# Patient Record
Sex: Female | Born: 1950
Health system: Southern US, Community
[De-identification: ages and names within clinical notes are randomized; demographics above are authoritative.]

## PROBLEM LIST (undated history)

## (undated) DIAGNOSIS — M797 Fibromyalgia: Secondary | ICD-10-CM

## (undated) DIAGNOSIS — D649 Anemia, unspecified: Secondary | ICD-10-CM

## (undated) DIAGNOSIS — T883XXA Malignant hyperthermia due to anesthesia, initial encounter: Secondary | ICD-10-CM

## (undated) DIAGNOSIS — C801 Malignant (primary) neoplasm, unspecified: Secondary | ICD-10-CM

## (undated) DIAGNOSIS — K589 Irritable bowel syndrome without diarrhea: Secondary | ICD-10-CM

## (undated) DIAGNOSIS — Z741 Need for assistance with personal care: Secondary | ICD-10-CM

## (undated) DIAGNOSIS — F419 Anxiety disorder, unspecified: Secondary | ICD-10-CM

## (undated) DIAGNOSIS — R569 Unspecified convulsions: Secondary | ICD-10-CM

## (undated) DIAGNOSIS — M5010 Cervical disc disorder with radiculopathy, unspecified cervical region: Secondary | ICD-10-CM

## (undated) DIAGNOSIS — K449 Diaphragmatic hernia without obstruction or gangrene: Secondary | ICD-10-CM

## (undated) DIAGNOSIS — K219 Gastro-esophageal reflux disease without esophagitis: Secondary | ICD-10-CM

## (undated) DIAGNOSIS — G8929 Other chronic pain: Secondary | ICD-10-CM

## (undated) DIAGNOSIS — K222 Esophageal obstruction: Secondary | ICD-10-CM

## (undated) DIAGNOSIS — G47419 Narcolepsy without cataplexy: Secondary | ICD-10-CM

## (undated) DIAGNOSIS — I639 Cerebral infarction, unspecified: Secondary | ICD-10-CM

## (undated) DIAGNOSIS — G43909 Migraine, unspecified, not intractable, without status migrainosus: Secondary | ICD-10-CM

## (undated) DIAGNOSIS — G9341 Metabolic encephalopathy: Secondary | ICD-10-CM

## (undated) DIAGNOSIS — F339 Major depressive disorder, recurrent, unspecified: Secondary | ICD-10-CM

## (undated) DIAGNOSIS — J189 Pneumonia, unspecified organism: Secondary | ICD-10-CM

## (undated) DIAGNOSIS — R198 Other specified symptoms and signs involving the digestive system and abdomen: Secondary | ICD-10-CM

## (undated) DIAGNOSIS — J449 Chronic obstructive pulmonary disease, unspecified: Secondary | ICD-10-CM

## (undated) DIAGNOSIS — IMO0001 Reserved for inherently not codable concepts without codable children: Secondary | ICD-10-CM

## (undated) DIAGNOSIS — G5790 Unspecified mononeuropathy of unspecified lower limb: Secondary | ICD-10-CM

## (undated) DIAGNOSIS — M1990 Unspecified osteoarthritis, unspecified site: Secondary | ICD-10-CM

## (undated) DIAGNOSIS — D496 Neoplasm of unspecified behavior of brain: Secondary | ICD-10-CM

## (undated) DIAGNOSIS — I10 Essential (primary) hypertension: Secondary | ICD-10-CM

## (undated) DIAGNOSIS — T7840XA Allergy, unspecified, initial encounter: Secondary | ICD-10-CM

## (undated) DIAGNOSIS — E039 Hypothyroidism, unspecified: Secondary | ICD-10-CM

## (undated) DIAGNOSIS — G4700 Insomnia, unspecified: Secondary | ICD-10-CM

## (undated) DIAGNOSIS — R011 Cardiac murmur, unspecified: Secondary | ICD-10-CM

## (undated) DIAGNOSIS — K5904 Chronic idiopathic constipation: Secondary | ICD-10-CM

## (undated) DIAGNOSIS — F319 Bipolar disorder, unspecified: Secondary | ICD-10-CM

## (undated) DIAGNOSIS — E785 Hyperlipidemia, unspecified: Secondary | ICD-10-CM

## (undated) DIAGNOSIS — IMO0002 Reserved for concepts with insufficient information to code with codable children: Secondary | ICD-10-CM

## (undated) DIAGNOSIS — K635 Polyp of colon: Secondary | ICD-10-CM

## (undated) DIAGNOSIS — G5601 Carpal tunnel syndrome, right upper limb: Secondary | ICD-10-CM

## (undated) DIAGNOSIS — M542 Cervicalgia: Secondary | ICD-10-CM

## (undated) DIAGNOSIS — M549 Dorsalgia, unspecified: Secondary | ICD-10-CM

## (undated) DIAGNOSIS — K5790 Diverticulosis of intestine, part unspecified, without perforation or abscess without bleeding: Secondary | ICD-10-CM

## (undated) DIAGNOSIS — K859 Acute pancreatitis without necrosis or infection, unspecified: Secondary | ICD-10-CM

## (undated) DIAGNOSIS — G4730 Sleep apnea, unspecified: Secondary | ICD-10-CM

## (undated) DIAGNOSIS — Z5189 Encounter for other specified aftercare: Secondary | ICD-10-CM

## (undated) DIAGNOSIS — F329 Major depressive disorder, single episode, unspecified: Secondary | ICD-10-CM

## (undated) DIAGNOSIS — K56609 Unspecified intestinal obstruction, unspecified as to partial versus complete obstruction: Secondary | ICD-10-CM

## (undated) DIAGNOSIS — R296 Repeated falls: Secondary | ICD-10-CM

## (undated) DIAGNOSIS — G839 Paralytic syndrome, unspecified: Secondary | ICD-10-CM

## (undated) DIAGNOSIS — D126 Benign neoplasm of colon, unspecified: Secondary | ICD-10-CM

## (undated) HISTORY — DX: Dorsalgia, unspecified: M54.9

## (undated) HISTORY — DX: Esophageal obstruction: K22.2

## (undated) HISTORY — PX: REDUCTION MAMMAPLASTY: SUR839

## (undated) HISTORY — DX: Malignant hyperthermia due to anesthesia, initial encounter: T88.3XXA

## (undated) HISTORY — DX: Migraine, unspecified, not intractable, without status migrainosus: G43.909

## (undated) HISTORY — DX: Polyp of colon: K63.5

## (undated) HISTORY — DX: Diaphragmatic hernia without obstruction or gangrene: K44.9

## (undated) HISTORY — DX: Diverticulosis of intestine, part unspecified, without perforation or abscess without bleeding: K57.90

## (undated) HISTORY — DX: Unspecified intestinal obstruction, unspecified as to partial versus complete obstruction: K56.609

## (undated) HISTORY — DX: Neoplasm of unspecified behavior of brain: D49.6

## (undated) HISTORY — DX: Other chronic pain: G89.29

## (undated) HISTORY — DX: Essential (primary) hypertension: I10

## (undated) HISTORY — DX: Chronic idiopathic constipation: K59.04

## (undated) HISTORY — DX: Sleep apnea, unspecified: G47.30

## (undated) HISTORY — DX: Fibromyalgia: M79.7

## (undated) HISTORY — DX: Acute pancreatitis without necrosis or infection, unspecified: K85.90

## (undated) HISTORY — DX: Major depressive disorder, recurrent, unspecified: F33.9

## (undated) HISTORY — DX: Irritable bowel syndrome, unspecified: K58.9

## (undated) HISTORY — PX: OVARIAN CYST REMOVAL: SHX89

## (undated) HISTORY — DX: Benign neoplasm of colon, unspecified: D12.6

## (undated) HISTORY — PX: CATARACT EXTRACTION: SUR2

## (undated) HISTORY — DX: Allergy, unspecified, initial encounter: T78.40XA

## (undated) HISTORY — DX: Bipolar disorder, unspecified: F31.9

---

## 1976-03-13 HISTORY — PX: ABDOMINAL HYSTERECTOMY: SHX81

## 1982-03-13 HISTORY — PX: CHOLECYSTECTOMY: SHX55

## 1992-03-13 HISTORY — PX: BREAST REDUCTION SURGERY: SHX8

## 1999-04-28 ENCOUNTER — Other Ambulatory Visit: Admission: RE | Admit: 1999-04-28 | Discharge: 1999-04-28 | Payer: Self-pay | Admitting: Internal Medicine

## 1999-04-28 ENCOUNTER — Encounter (INDEPENDENT_AMBULATORY_CARE_PROVIDER_SITE_OTHER): Payer: Self-pay | Admitting: Specialist

## 1999-04-28 ENCOUNTER — Encounter: Payer: Self-pay | Admitting: Internal Medicine

## 1999-11-29 ENCOUNTER — Encounter: Payer: Self-pay | Admitting: Family Medicine

## 1999-11-29 ENCOUNTER — Encounter: Admission: RE | Admit: 1999-11-29 | Discharge: 1999-11-29 | Payer: Self-pay | Admitting: Family Medicine

## 2000-03-27 ENCOUNTER — Ambulatory Visit (HOSPITAL_COMMUNITY): Admission: RE | Admit: 2000-03-27 | Discharge: 2000-03-27 | Payer: Self-pay | Admitting: Internal Medicine

## 2000-03-27 ENCOUNTER — Encounter: Payer: Self-pay | Admitting: Internal Medicine

## 2000-05-14 ENCOUNTER — Ambulatory Visit (HOSPITAL_COMMUNITY): Admission: RE | Admit: 2000-05-14 | Discharge: 2000-05-14 | Payer: Self-pay | Admitting: Internal Medicine

## 2000-05-14 ENCOUNTER — Encounter: Payer: Self-pay | Admitting: Internal Medicine

## 2000-07-04 ENCOUNTER — Other Ambulatory Visit: Admission: RE | Admit: 2000-07-04 | Discharge: 2000-07-04 | Payer: Self-pay | Admitting: Family Medicine

## 2000-07-05 ENCOUNTER — Ambulatory Visit (HOSPITAL_COMMUNITY): Admission: RE | Admit: 2000-07-05 | Discharge: 2000-07-05 | Payer: Self-pay | Admitting: Family Medicine

## 2000-07-05 ENCOUNTER — Encounter: Payer: Self-pay | Admitting: Family Medicine

## 2000-07-09 ENCOUNTER — Encounter: Payer: Self-pay | Admitting: Internal Medicine

## 2000-07-09 ENCOUNTER — Ambulatory Visit (HOSPITAL_COMMUNITY): Admission: RE | Admit: 2000-07-09 | Discharge: 2000-07-09 | Payer: Self-pay | Admitting: Internal Medicine

## 2000-08-23 ENCOUNTER — Encounter: Payer: Self-pay | Admitting: Emergency Medicine

## 2000-08-23 ENCOUNTER — Emergency Department (HOSPITAL_COMMUNITY): Admission: EM | Admit: 2000-08-23 | Discharge: 2000-08-23 | Payer: Self-pay | Admitting: Emergency Medicine

## 2000-08-25 ENCOUNTER — Emergency Department (HOSPITAL_COMMUNITY): Admission: EM | Admit: 2000-08-25 | Discharge: 2000-08-25 | Payer: Self-pay | Admitting: *Deleted

## 2000-09-04 ENCOUNTER — Emergency Department (HOSPITAL_COMMUNITY): Admission: EM | Admit: 2000-09-04 | Discharge: 2000-09-04 | Payer: Self-pay | Admitting: Emergency Medicine

## 2000-12-05 ENCOUNTER — Encounter: Admission: RE | Admit: 2000-12-05 | Discharge: 2000-12-05 | Payer: Self-pay | Admitting: Family Medicine

## 2000-12-05 ENCOUNTER — Encounter: Payer: Self-pay | Admitting: Family Medicine

## 2001-03-07 ENCOUNTER — Ambulatory Visit (HOSPITAL_COMMUNITY): Admission: RE | Admit: 2001-03-07 | Discharge: 2001-03-07 | Payer: Self-pay | Admitting: Family Medicine

## 2001-03-07 ENCOUNTER — Encounter: Payer: Self-pay | Admitting: Family Medicine

## 2001-04-02 ENCOUNTER — Encounter: Payer: Self-pay | Admitting: Urology

## 2001-04-02 ENCOUNTER — Ambulatory Visit (HOSPITAL_COMMUNITY): Admission: RE | Admit: 2001-04-02 | Discharge: 2001-04-02 | Payer: Self-pay | Admitting: Urology

## 2001-08-04 ENCOUNTER — Emergency Department (HOSPITAL_COMMUNITY): Admission: EM | Admit: 2001-08-04 | Discharge: 2001-08-04 | Payer: Self-pay | Admitting: Emergency Medicine

## 2001-08-16 ENCOUNTER — Encounter: Payer: Self-pay | Admitting: Family Medicine

## 2001-08-16 ENCOUNTER — Ambulatory Visit (HOSPITAL_COMMUNITY): Admission: RE | Admit: 2001-08-16 | Discharge: 2001-08-16 | Payer: Self-pay | Admitting: Family Medicine

## 2001-08-20 ENCOUNTER — Emergency Department (HOSPITAL_COMMUNITY): Admission: EM | Admit: 2001-08-20 | Discharge: 2001-08-20 | Payer: Self-pay | Admitting: Emergency Medicine

## 2001-09-11 ENCOUNTER — Encounter: Payer: Self-pay | Admitting: Family Medicine

## 2001-09-11 ENCOUNTER — Ambulatory Visit (HOSPITAL_COMMUNITY): Admission: RE | Admit: 2001-09-11 | Discharge: 2001-09-11 | Payer: Self-pay | Admitting: Family Medicine

## 2001-09-19 ENCOUNTER — Ambulatory Visit (HOSPITAL_COMMUNITY): Admission: RE | Admit: 2001-09-19 | Discharge: 2001-09-19 | Payer: Self-pay | Admitting: Internal Medicine

## 2001-09-19 ENCOUNTER — Encounter: Payer: Self-pay | Admitting: Internal Medicine

## 2001-10-01 ENCOUNTER — Encounter (INDEPENDENT_AMBULATORY_CARE_PROVIDER_SITE_OTHER): Payer: Self-pay | Admitting: Specialist

## 2001-10-01 ENCOUNTER — Ambulatory Visit (HOSPITAL_COMMUNITY): Admission: RE | Admit: 2001-10-01 | Discharge: 2001-10-01 | Payer: Self-pay | Admitting: Internal Medicine

## 2001-10-31 ENCOUNTER — Encounter: Payer: Self-pay | Admitting: Internal Medicine

## 2001-11-12 ENCOUNTER — Encounter: Payer: Self-pay | Admitting: Internal Medicine

## 2001-11-12 ENCOUNTER — Ambulatory Visit (HOSPITAL_COMMUNITY): Admission: RE | Admit: 2001-11-12 | Discharge: 2001-11-12 | Payer: Self-pay | Admitting: Internal Medicine

## 2001-12-02 ENCOUNTER — Encounter: Payer: Self-pay | Admitting: Family Medicine

## 2001-12-02 ENCOUNTER — Encounter: Admission: RE | Admit: 2001-12-02 | Discharge: 2001-12-02 | Payer: Self-pay | Admitting: Family Medicine

## 2002-01-01 ENCOUNTER — Ambulatory Visit (HOSPITAL_COMMUNITY): Admission: RE | Admit: 2002-01-01 | Discharge: 2002-01-01 | Payer: Self-pay | Admitting: Internal Medicine

## 2002-01-01 ENCOUNTER — Encounter: Payer: Self-pay | Admitting: Internal Medicine

## 2002-03-28 ENCOUNTER — Encounter: Payer: Self-pay | Admitting: Family Medicine

## 2002-03-28 ENCOUNTER — Ambulatory Visit (HOSPITAL_COMMUNITY): Admission: RE | Admit: 2002-03-28 | Discharge: 2002-03-28 | Payer: Self-pay | Admitting: Family Medicine

## 2002-05-27 ENCOUNTER — Ambulatory Visit (HOSPITAL_COMMUNITY): Admission: RE | Admit: 2002-05-27 | Discharge: 2002-05-27 | Payer: Self-pay | Admitting: Family Medicine

## 2002-05-27 ENCOUNTER — Encounter: Payer: Self-pay | Admitting: Family Medicine

## 2002-07-15 ENCOUNTER — Ambulatory Visit (HOSPITAL_COMMUNITY): Admission: RE | Admit: 2002-07-15 | Discharge: 2002-07-15 | Payer: Self-pay | Admitting: Family Medicine

## 2002-07-15 ENCOUNTER — Encounter: Payer: Self-pay | Admitting: Family Medicine

## 2002-09-01 ENCOUNTER — Ambulatory Visit (HOSPITAL_COMMUNITY): Admission: RE | Admit: 2002-09-01 | Discharge: 2002-09-01 | Payer: Self-pay | Admitting: Family Medicine

## 2002-09-01 ENCOUNTER — Encounter: Payer: Self-pay | Admitting: Family Medicine

## 2002-10-13 ENCOUNTER — Ambulatory Visit (HOSPITAL_COMMUNITY): Admission: RE | Admit: 2002-10-13 | Discharge: 2002-10-13 | Payer: Self-pay | Admitting: Neurology

## 2002-10-13 ENCOUNTER — Encounter: Payer: Self-pay | Admitting: Neurology

## 2002-11-21 ENCOUNTER — Encounter: Payer: Self-pay | Admitting: Neurology

## 2002-11-21 ENCOUNTER — Ambulatory Visit (HOSPITAL_COMMUNITY): Admission: RE | Admit: 2002-11-21 | Discharge: 2002-11-21 | Payer: Self-pay | Admitting: Neurology

## 2002-11-26 ENCOUNTER — Encounter: Payer: Self-pay | Admitting: Family Medicine

## 2002-11-26 ENCOUNTER — Ambulatory Visit (HOSPITAL_COMMUNITY): Admission: RE | Admit: 2002-11-26 | Discharge: 2002-11-26 | Payer: Self-pay | Admitting: Family Medicine

## 2002-12-09 ENCOUNTER — Encounter: Payer: Self-pay | Admitting: Family Medicine

## 2002-12-09 ENCOUNTER — Ambulatory Visit (HOSPITAL_COMMUNITY): Admission: RE | Admit: 2002-12-09 | Discharge: 2002-12-09 | Payer: Self-pay | Admitting: Family Medicine

## 2003-01-01 ENCOUNTER — Encounter: Payer: Self-pay | Admitting: Family Medicine

## 2003-01-01 ENCOUNTER — Encounter: Admission: RE | Admit: 2003-01-01 | Discharge: 2003-01-01 | Payer: Self-pay | Admitting: Family Medicine

## 2003-04-02 ENCOUNTER — Ambulatory Visit (HOSPITAL_COMMUNITY): Admission: RE | Admit: 2003-04-02 | Discharge: 2003-04-02 | Payer: Self-pay | Admitting: Family Medicine

## 2003-04-10 ENCOUNTER — Ambulatory Visit (HOSPITAL_COMMUNITY): Admission: RE | Admit: 2003-04-10 | Discharge: 2003-04-10 | Payer: Self-pay | Admitting: Family Medicine

## 2003-08-09 ENCOUNTER — Inpatient Hospital Stay (HOSPITAL_COMMUNITY): Admission: EM | Admit: 2003-08-09 | Discharge: 2003-08-13 | Payer: Self-pay | Admitting: Emergency Medicine

## 2003-12-19 ENCOUNTER — Inpatient Hospital Stay (HOSPITAL_COMMUNITY): Admission: EM | Admit: 2003-12-19 | Discharge: 2003-12-21 | Payer: Self-pay | Admitting: Emergency Medicine

## 2003-12-20 IMAGING — CT CT HEAD W/O CM
1 series · 16 of 26 positions shown, 20 images · non-contrast
Comparison: none

FINDINGS
CLINICAL DATA: DIZZINESS; FALLS; DIABETES
CT OF THE HEAD WITHOUT CONTRAST
ROUTINE NON-CONTRAST HEAD CT WAS PERFORMED.
THERE IS NO EVIDENCE OF INTRACRANIAL HEMORRHAGE, BRAIN EDEMA, OR MASS EFFECT.  THE VENTRICLES ARE
NORMAL.  NO EXTRA-AXIAL ABNORMALITIES ARE IDENTIFIED.  BONE WINDOWS SHOW NO SIGNIFICANT
ABNORMALITIES.
IMPRESSION
NEGATIVE NON-CONTRAST HEAD CT.

[Series 7603: — · axial · 0.44mm/px · z∈[-659,-544]mm · 16 of 26 slices shown, 20 images]
[im 2/26  brain]
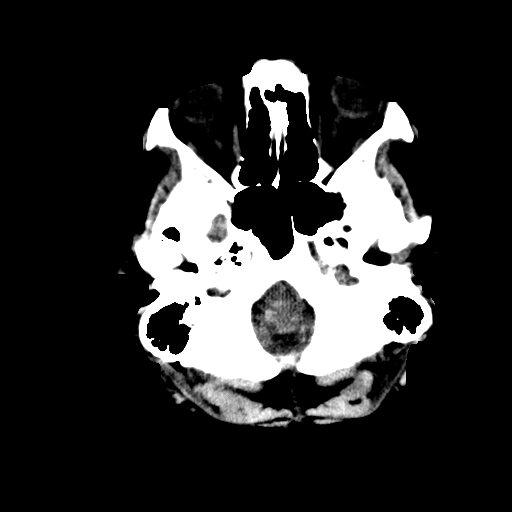
[im 2/26  bone]
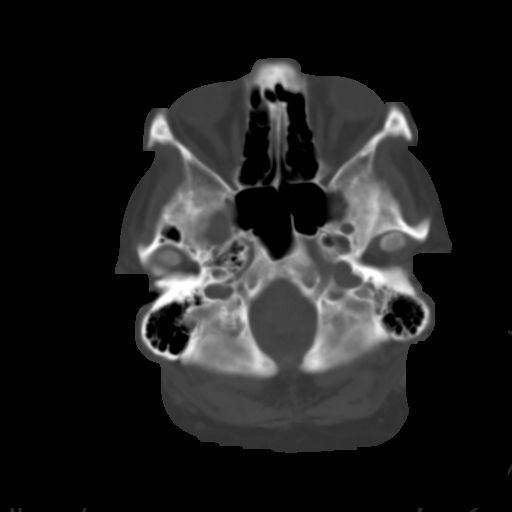
[im 4/26  brain]
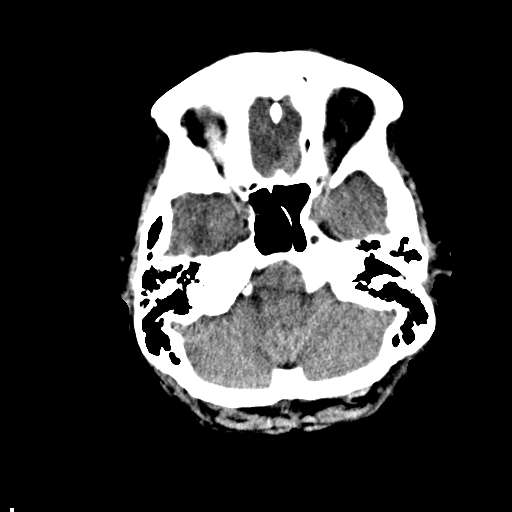
[im 5/26  brain]
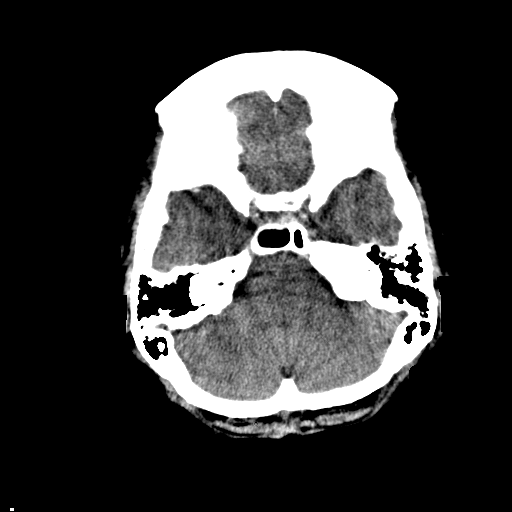
[im 7/26  brain]
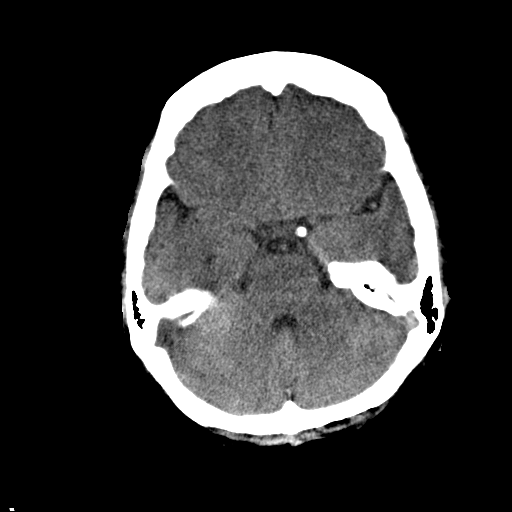
[im 8/26  brain]
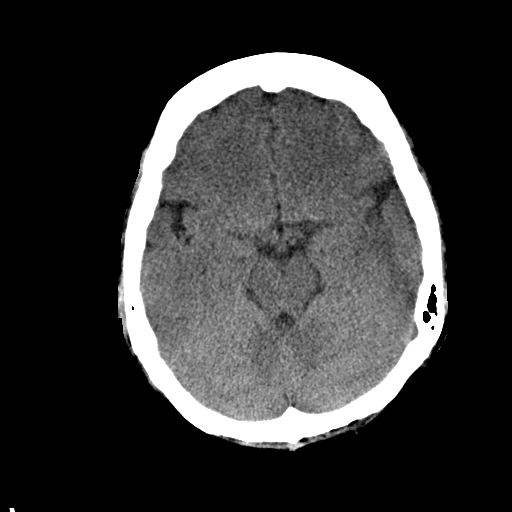
[im 8/26  bone]
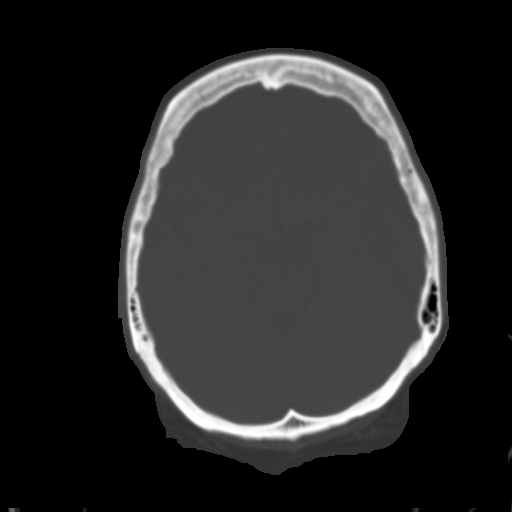
[im 10/26  brain]
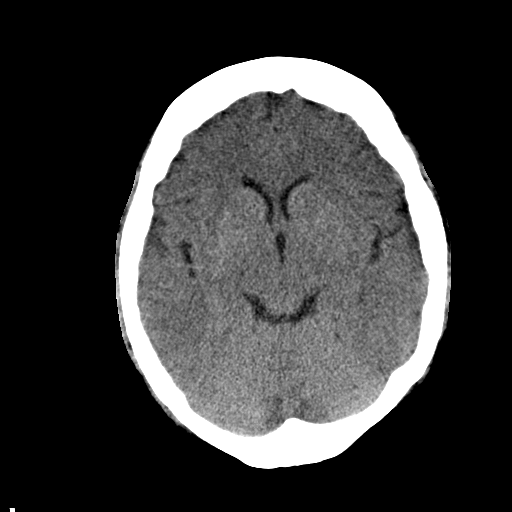
[im 11/26  brain]
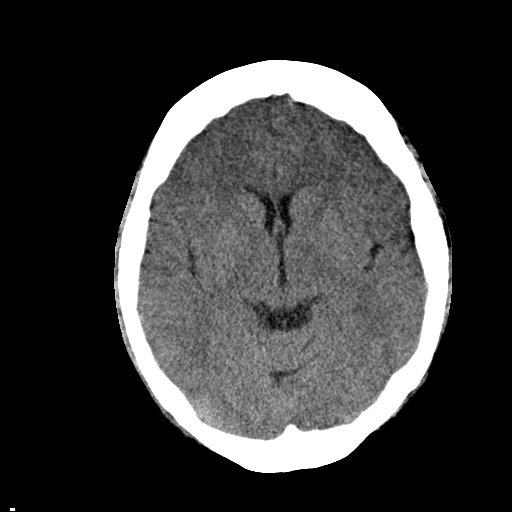
[im 13/26  brain]
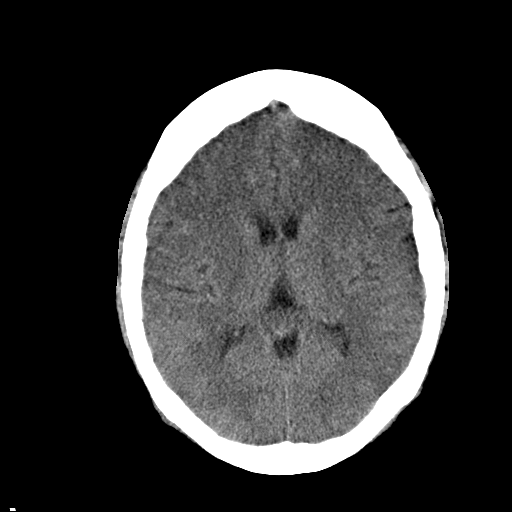
[im 14/26  brain]
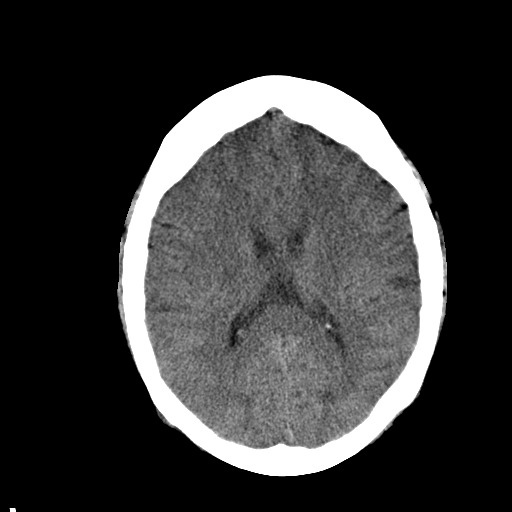
[im 14/26  bone]
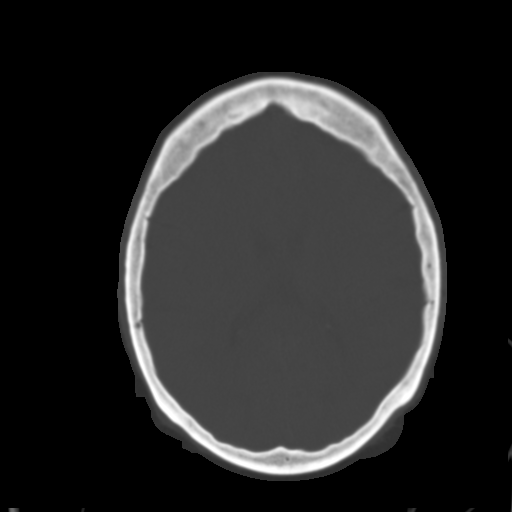
[im 16/26  brain]
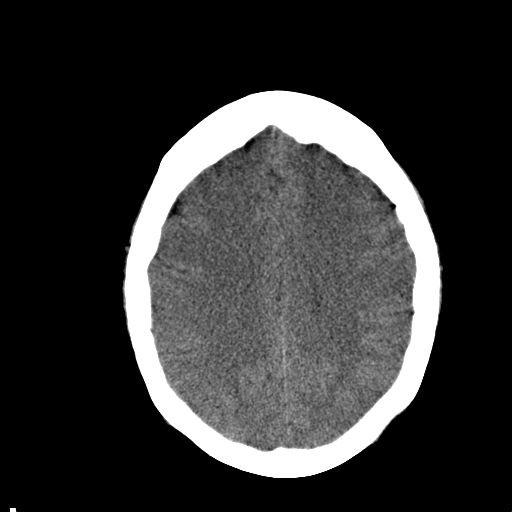
[im 17/26  brain]
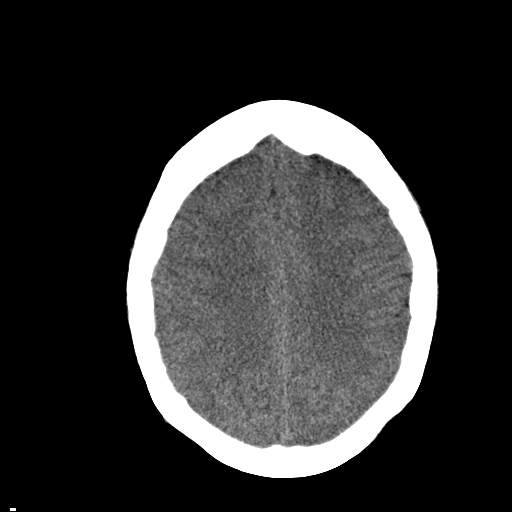
[im 19/26  brain]
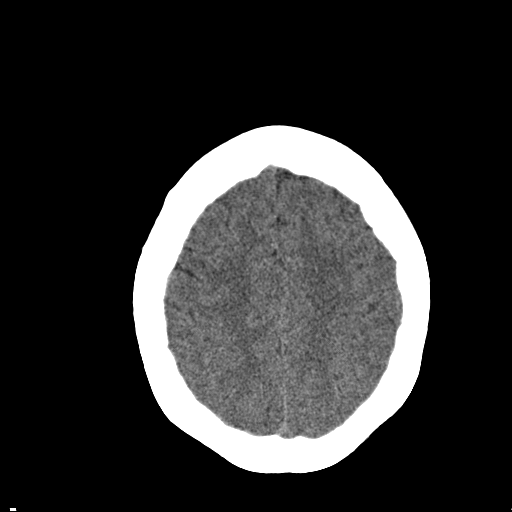
[im 20/26  brain]
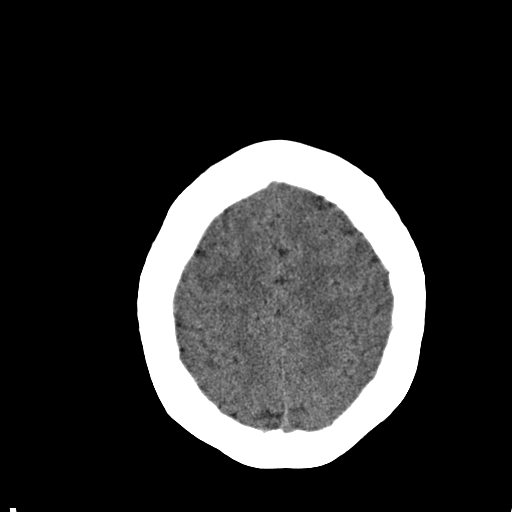
[im 20/26  bone]
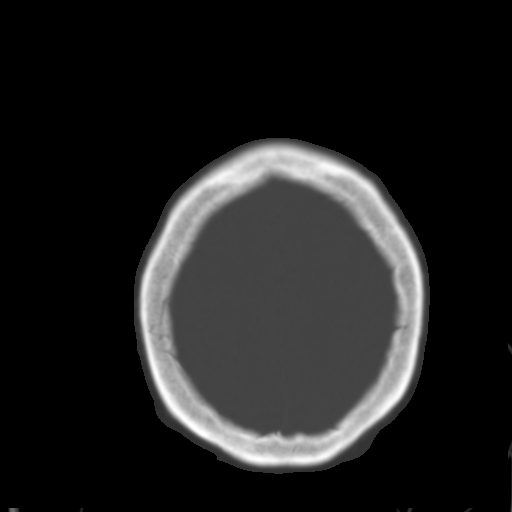
[im 22/26  brain]
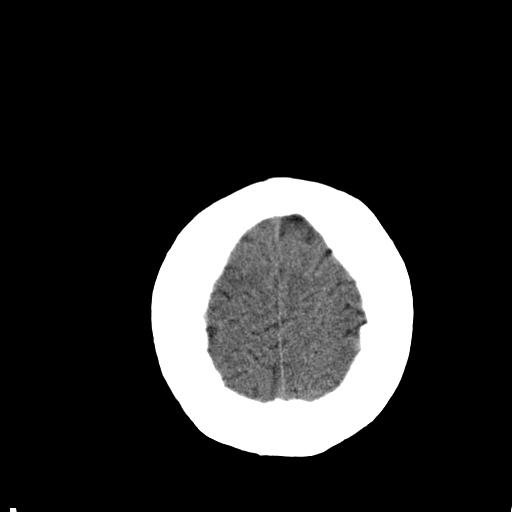
[im 23/26  brain]
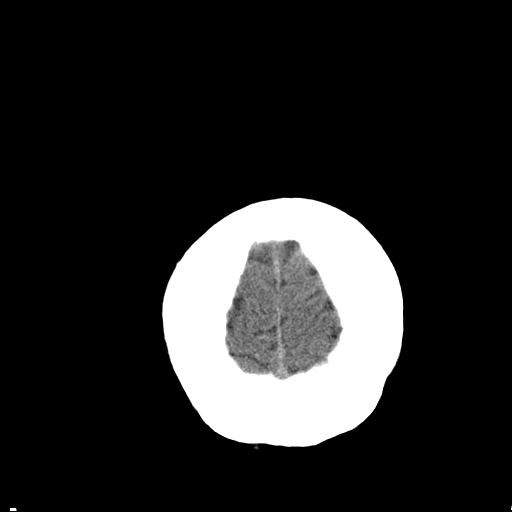
[im 25/26  brain]
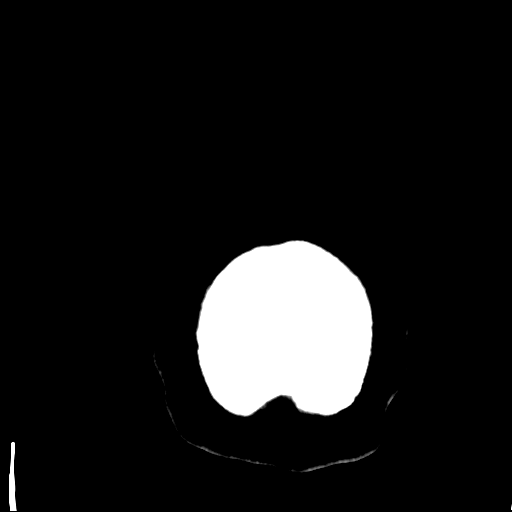

[16 of 26 positions shown; findings below may reference images not displayed]

## 2003-12-21 ENCOUNTER — Encounter: Payer: Self-pay | Admitting: Neurology

## 2004-01-04 ENCOUNTER — Encounter: Admission: RE | Admit: 2004-01-04 | Discharge: 2004-01-04 | Payer: Self-pay | Admitting: Family Medicine

## 2004-02-08 ENCOUNTER — Ambulatory Visit: Payer: Self-pay | Admitting: Family Medicine

## 2004-03-02 ENCOUNTER — Ambulatory Visit (HOSPITAL_COMMUNITY): Admission: RE | Admit: 2004-03-02 | Discharge: 2004-03-02 | Payer: Self-pay | Admitting: Family Medicine

## 2004-03-13 DIAGNOSIS — K859 Acute pancreatitis without necrosis or infection, unspecified: Secondary | ICD-10-CM

## 2004-03-13 HISTORY — PX: NM MYOCAR PERF WALL MOTION: HXRAD629

## 2004-03-13 HISTORY — DX: Acute pancreatitis without necrosis or infection, unspecified: K85.90

## 2004-03-26 IMAGING — CT CT PELVIS W/ CM
1 of 2 series · 14 of 32 positions shown, 19 images · IV contrast (omnipaque)
Comparison: none

FINDINGS
CLINICAL DATA: RIGHT HEPATIC LOBE LESION.
CT ABDOMEN WITH CONTRAST
COMPARING TO 01/01/02.
CONTIGUOUS AXIAL CT IMAGES WERE OBTAINED FROM THE LUNG BASES THROUGH THE ILIAC CRESTS FOLLOWING
ORAL CONTRAST AND INTRAVENOUS ADMINISTRATION OF 150 CC OMNIPAQUE 300 IV CONTRAST.
THE LUNG BASES APPEAR CLEAR.  THE GALLBLADDER HAS BEEN REMOVED.
ALONG THE ANTERIOR PORTION OF THE POSTERIOR SEGMENT OF THE RIGHT HEPATIC LOBE, WE ONCE AGAIN
DEMONSTRATE ILL DEFINED HYPODENSITY MEASURING APPROXIMATELY 1.5 CM.  THE SMALL REGION OF
HYPODENSITY IS OVERALL UNCHANGED AND AGAIN IS FELT TO LIKELY REPRESENT FOCAL FATTY INFILTRATION.
THE LESION IS OF LESS THAN LIVER PARENCHYMAL DENSITY ON THE EARLY PORTAL VENOUS PHASE IMAGES BUT IS
ISODENSE TO THE LIVER ON DELAYED PHASE IMAGES.  THE SPLEEN AND PANCREAS ARE WITHIN NORMAL LIMITS.
NO RETROPERITONEAL ADENOPATHY.  THERE ARE FILLING DEFECTS IN THE DUODENUM AND SMALL BOWEL
CONSISTENT WITH A RECENT MEAL.  THESE FILLING DEFECTS ARE NOT PRESENT ON THE PRIOR STUDY AND THUS,
I DON'T THINK THAT THEY REPRESENT POLYPS.
THE DUODENUM DOES EXTEND INFERIORLY ALMOST TO THE LEVEL OF THE ILIAC CRESTS, AND ONLY EXTENDS BACK
UP TO THE LEVEL OF THE LOWER POLES OF THE KIDNEYS.  THIS IS SUGGESTIVE OF A LAX LIGAMENT OF TREITZ.
 I DO NOT DEMONSTRATE REVERSAL OF LOCATION OF THE SUPERIOR MESENTERIC ARTERY AND VEIN TO SUGGEST
ACTUAL MALROTATION.
IMPRESSION
1.  STABLE APPEARANCE OF INDISTINCT LOW DENSITY LESION ALONG THE POSTERIOR SEGMENT OF THE RIGHT
HEPATIC LOBE, STATISTICALLY LIKELY TO REPRESENT FOCAL FATTY INFILTRATION.  IF CLINICALLY WARRANTED,
A NON CONTRAST MR WITHIN IN AND OUT OF PHASE IMAGING COULD BE USED TO FURTHER ASSESS THIS LESION.
2.  PROBABLE LAX LIGAMENT OF TREITZ ALTHOUGH NO OVERT MALROTATION.
CT PELVIS W/CONTRAST
CONTIGUOUS AXIAL CT IMAGES WERE OBTAINED FROM THE ILIAC CRESTS TO THE PROXIMAL FEMURS FOLLOWING
ORAL AND IV CONTRAST.
NO EVIDENCE OF APPENDICEAL INFLAMMATION.  VISUALIZED BOWEL APPEARS UNREMARKABLE.  THE PATIENT HAS
HAD PRIOR HYSTERECTOMY.  BLADDER APPEARS NORMAL.
UNREMARKABLE CT APPEARANCE OF THE PELVIS.

[Series 5228: — · axial · 0.72mm/px · z∈[+1322,+1732]mm · 14 of 92 slices shown, 19 images]
[im 5/92  soft-tissue]
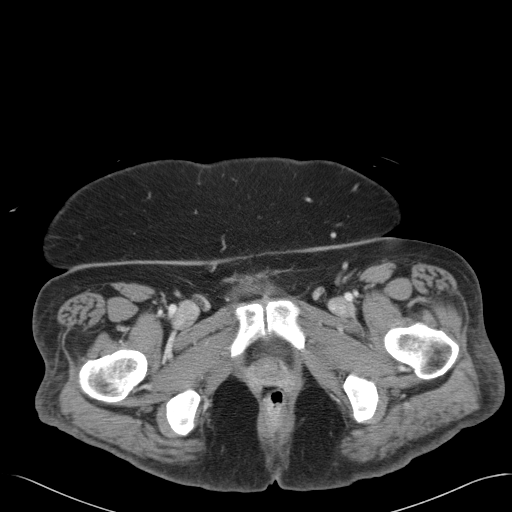
[im 5/92  bone]
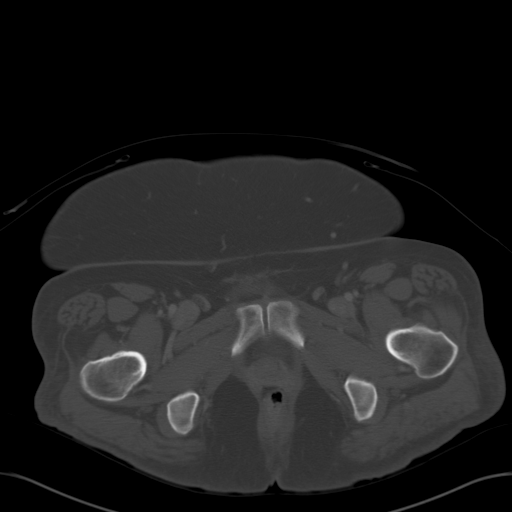
[im 14/92  soft-tissue]
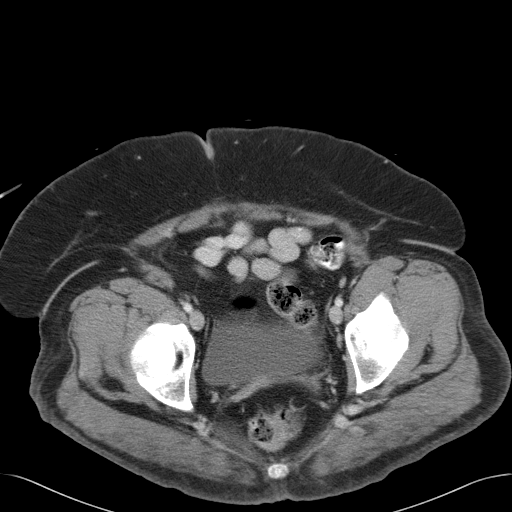
[im 19/92  soft-tissue]
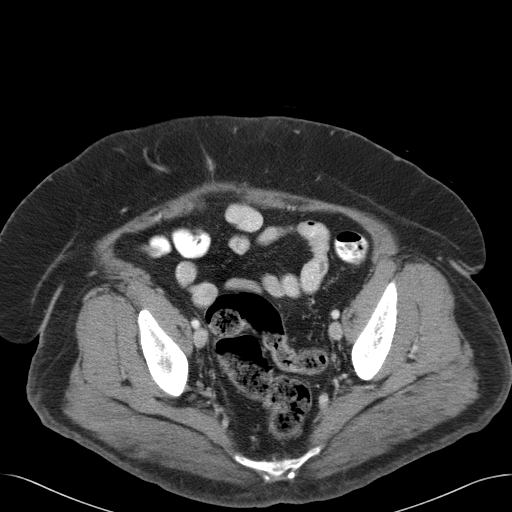
[im 28/92  soft-tissue]
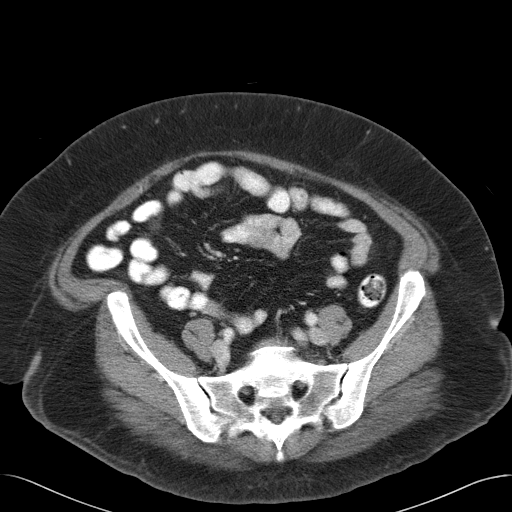
[im 32/92  soft-tissue]
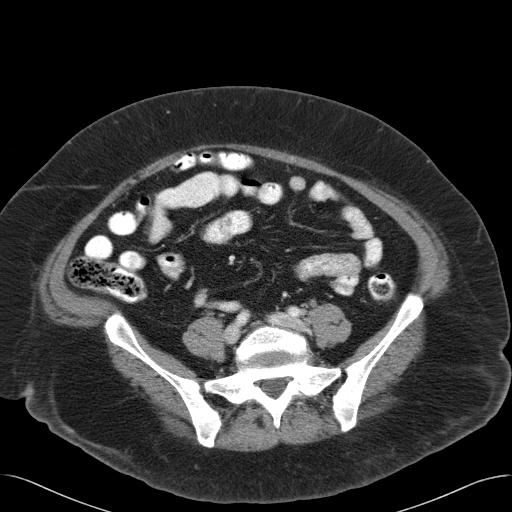
[im 41/92  soft-tissue]
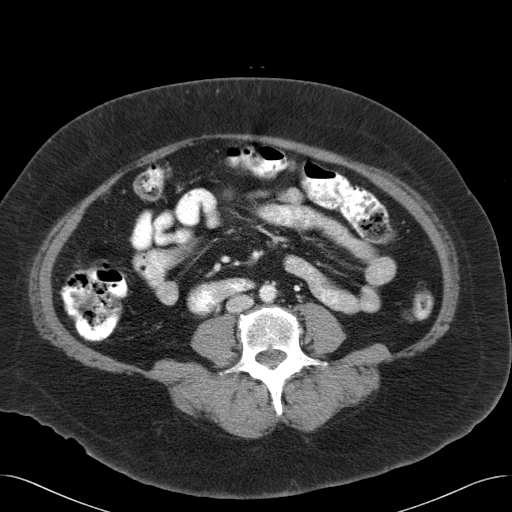
[im 46/92  soft-tissue]
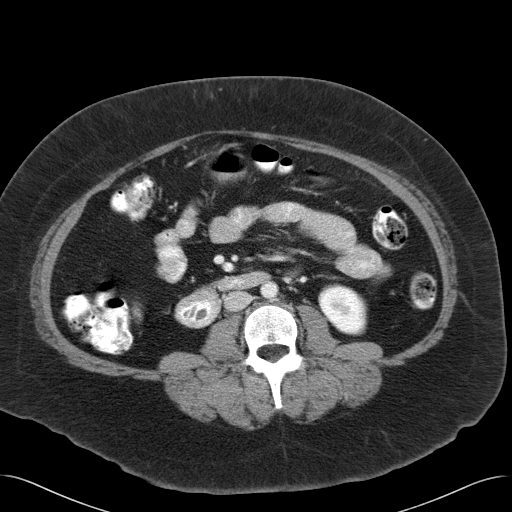
[im 51/92  soft-tissue]
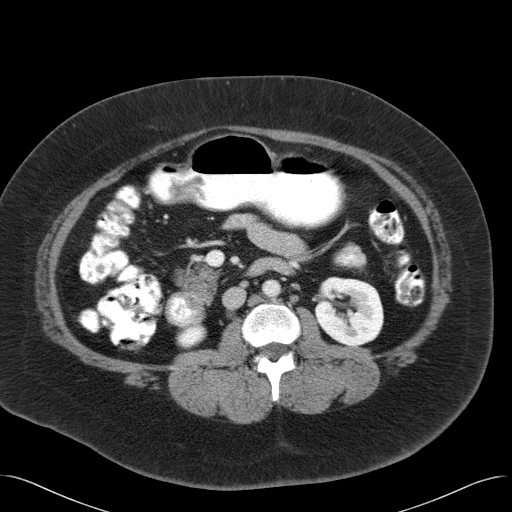
[im 60/92  soft-tissue]
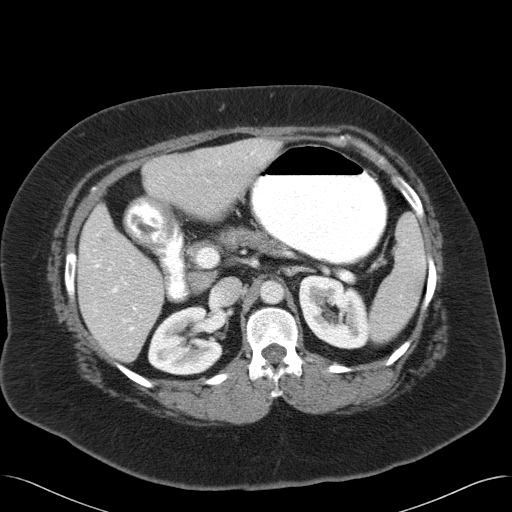
[im 60/92  bone]
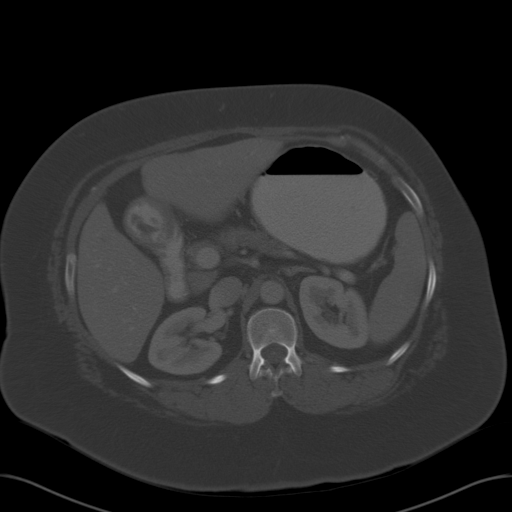
[im 64/92  soft-tissue]
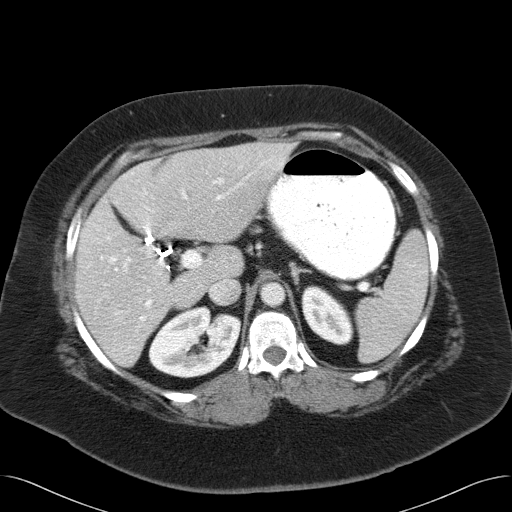
[im 73/92  soft-tissue]
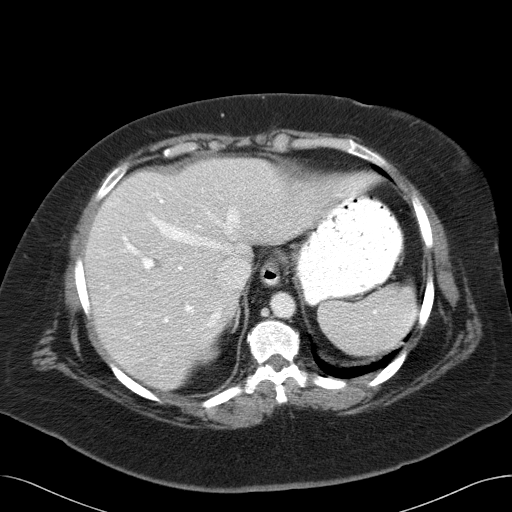
[im 73/92  lung]
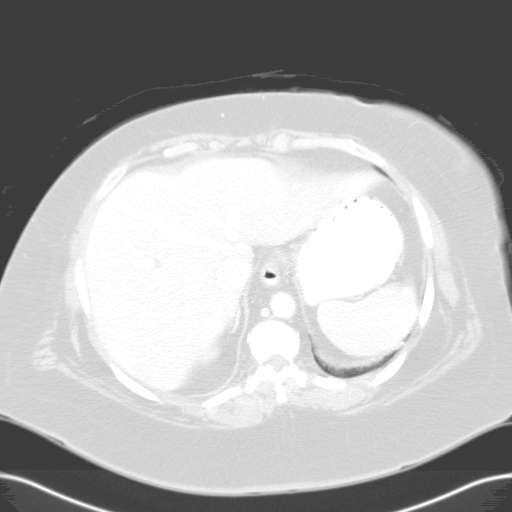
[im 78/92  soft-tissue]
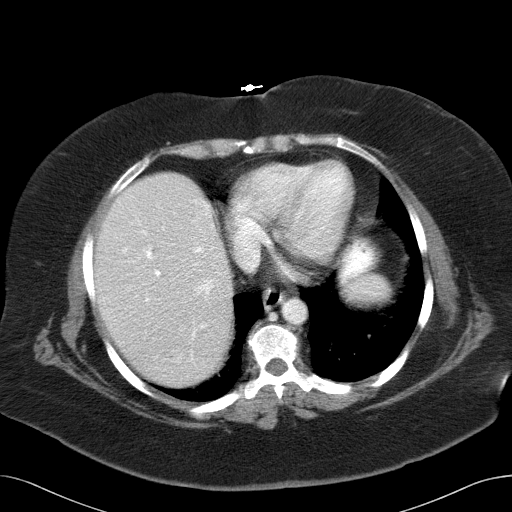
[im 78/92  lung]
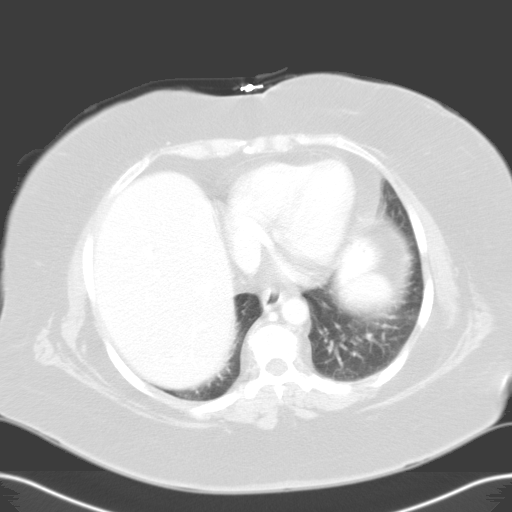
[im 82/92  lung]
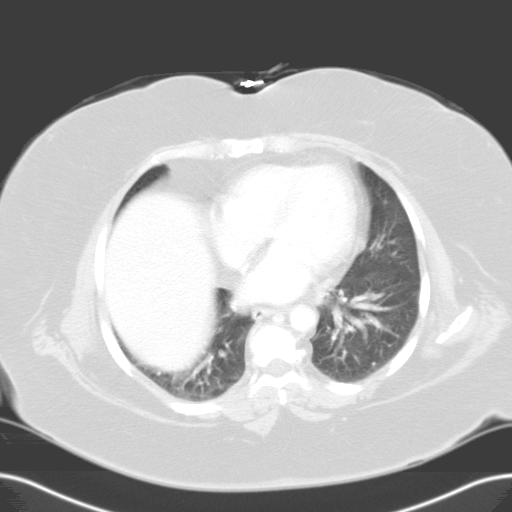
[im 87/92  soft-tissue]
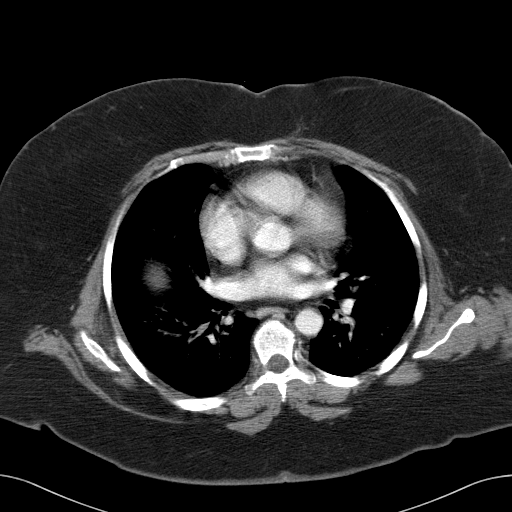
[im 87/92  lung]
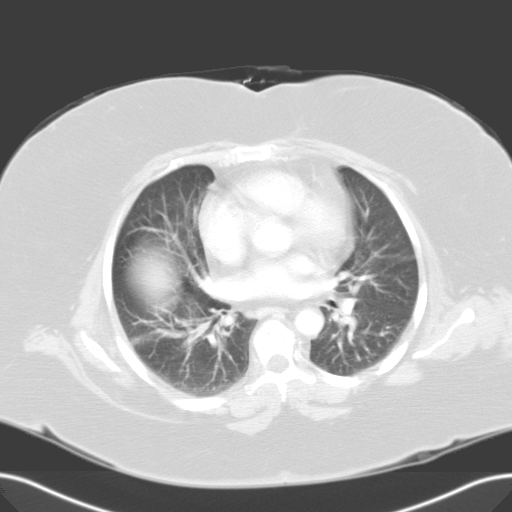

[14 of 32 positions shown; findings below may reference images not displayed]

## 2004-04-18 ENCOUNTER — Ambulatory Visit: Payer: Self-pay | Admitting: Family Medicine

## 2004-05-10 ENCOUNTER — Ambulatory Visit: Payer: Self-pay | Admitting: Family Medicine

## 2004-05-11 ENCOUNTER — Ambulatory Visit: Payer: Self-pay | Admitting: Family Medicine

## 2004-05-13 ENCOUNTER — Encounter (INDEPENDENT_AMBULATORY_CARE_PROVIDER_SITE_OTHER): Payer: Self-pay | Admitting: *Deleted

## 2004-05-17 ENCOUNTER — Ambulatory Visit: Payer: Self-pay | Admitting: Internal Medicine

## 2004-05-22 ENCOUNTER — Ambulatory Visit: Admission: RE | Admit: 2004-05-22 | Discharge: 2004-05-22 | Payer: Self-pay | Admitting: Family Medicine

## 2004-05-31 ENCOUNTER — Ambulatory Visit: Payer: Self-pay | Admitting: Internal Medicine

## 2004-06-07 ENCOUNTER — Ambulatory Visit: Payer: Self-pay | Admitting: Family Medicine

## 2004-06-08 ENCOUNTER — Ambulatory Visit: Payer: Self-pay | Admitting: Pulmonary Disease

## 2004-06-16 ENCOUNTER — Ambulatory Visit (HOSPITAL_COMMUNITY): Admission: RE | Admit: 2004-06-16 | Discharge: 2004-06-16 | Payer: Self-pay | Admitting: Internal Medicine

## 2004-07-05 ENCOUNTER — Ambulatory Visit: Payer: Self-pay | Admitting: Family Medicine

## 2004-07-18 ENCOUNTER — Ambulatory Visit: Payer: Self-pay | Admitting: Orthopedic Surgery

## 2004-07-19 ENCOUNTER — Encounter: Payer: Self-pay | Admitting: Orthopedic Surgery

## 2004-07-20 ENCOUNTER — Ambulatory Visit: Payer: Self-pay | Admitting: Family Medicine

## 2004-07-22 ENCOUNTER — Ambulatory Visit (HOSPITAL_COMMUNITY): Admission: RE | Admit: 2004-07-22 | Discharge: 2004-07-22 | Payer: Self-pay | Admitting: Orthopedic Surgery

## 2004-07-22 ENCOUNTER — Ambulatory Visit: Payer: Self-pay | Admitting: Orthopedic Surgery

## 2004-07-22 HISTORY — PX: CARPAL TUNNEL RELEASE: SHX101

## 2004-07-27 ENCOUNTER — Ambulatory Visit: Payer: Self-pay | Admitting: Orthopedic Surgery

## 2004-08-03 ENCOUNTER — Ambulatory Visit: Payer: Self-pay | Admitting: Family Medicine

## 2004-08-10 ENCOUNTER — Ambulatory Visit: Payer: Self-pay | Admitting: Orthopedic Surgery

## 2004-08-17 ENCOUNTER — Ambulatory Visit: Payer: Self-pay | Admitting: Pulmonary Disease

## 2004-08-23 ENCOUNTER — Ambulatory Visit (HOSPITAL_COMMUNITY): Admission: RE | Admit: 2004-08-23 | Discharge: 2004-08-23 | Payer: Self-pay | Admitting: Family Medicine

## 2004-08-24 ENCOUNTER — Ambulatory Visit: Payer: Self-pay | Admitting: Orthopedic Surgery

## 2004-09-02 ENCOUNTER — Ambulatory Visit: Payer: Self-pay | Admitting: Orthopedic Surgery

## 2004-09-02 ENCOUNTER — Ambulatory Visit (HOSPITAL_COMMUNITY): Admission: RE | Admit: 2004-09-02 | Discharge: 2004-09-02 | Payer: Self-pay | Admitting: Orthopedic Surgery

## 2004-09-07 ENCOUNTER — Ambulatory Visit: Payer: Self-pay | Admitting: Orthopedic Surgery

## 2004-09-14 ENCOUNTER — Ambulatory Visit: Payer: Self-pay | Admitting: Orthopedic Surgery

## 2004-09-14 ENCOUNTER — Ambulatory Visit: Payer: Self-pay | Admitting: Family Medicine

## 2004-10-19 ENCOUNTER — Ambulatory Visit: Payer: Self-pay | Admitting: Orthopedic Surgery

## 2004-10-27 ENCOUNTER — Ambulatory Visit: Payer: Self-pay | Admitting: Family Medicine

## 2004-12-22 ENCOUNTER — Ambulatory Visit: Payer: Self-pay | Admitting: Family Medicine

## 2004-12-27 ENCOUNTER — Emergency Department (HOSPITAL_COMMUNITY): Admission: EM | Admit: 2004-12-27 | Discharge: 2004-12-27 | Payer: Self-pay | Admitting: Emergency Medicine

## 2005-01-11 ENCOUNTER — Encounter: Admission: RE | Admit: 2005-01-11 | Discharge: 2005-01-11 | Payer: Self-pay | Admitting: Family Medicine

## 2005-01-24 ENCOUNTER — Ambulatory Visit: Payer: Self-pay | Admitting: Family Medicine

## 2005-02-15 ENCOUNTER — Ambulatory Visit: Payer: Self-pay | Admitting: Family Medicine

## 2005-03-03 IMAGING — CT CT HEAD W/O CM
1 series · 16 of 30 positions shown, 20 images · non-contrast
Comparison: none

CLINICAL DATA: Slurred speech.  Right arm numbness and weakness.  Altered level of consciousness.  Dizziness.  Evaluate for infarct or hemorrhage.  
 HEAD CT WITHOUT CONTRAST
 Routine noncontrast head CT was performed.  Comparison is made with prior study on 05/27/02.  
 A small ill-defined area of decreased attenuation is seen along the inferior aspect of the right lentiform nucleus which was not seen on previous study and is suspicious for an acute lacunar infarct.  There is no evidence of hemorrhage, abnormal mass effect or midline shift.  There is no evidence of extra-axial abnormalities or hydrocephalus.  
 IMPRESSION
 No evidence of hemorrhage or mass effect.
 Suspect acute lacunar infarct in the inferior right lentiform nucleus.  CT follow-up or brain MRI is recommended for further evaluation.

[Series 4615: — · axial · 0.43mm/px · z∈[-617,-482]mm · 16 of 30 slices shown, 20 images]
[im 2/30  brain]
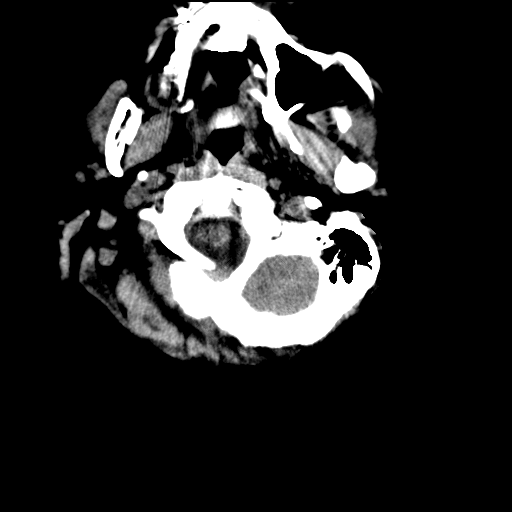
[im 2/30  bone]
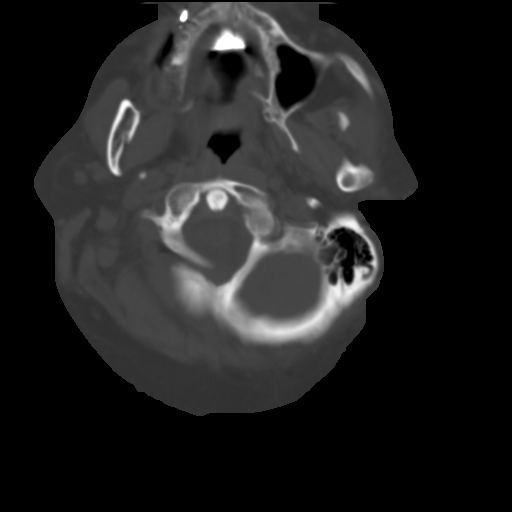
[im 4/30  brain]
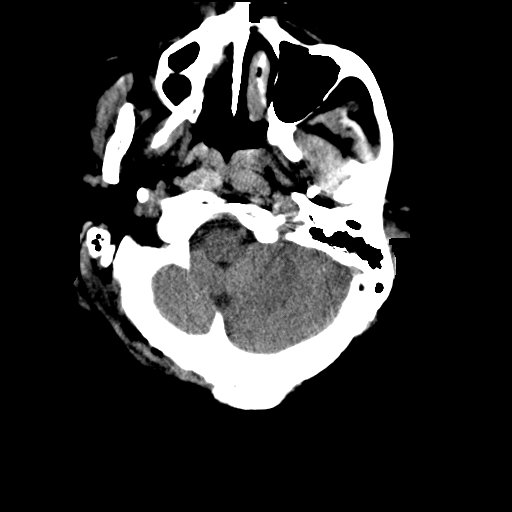
[im 6/30  brain]
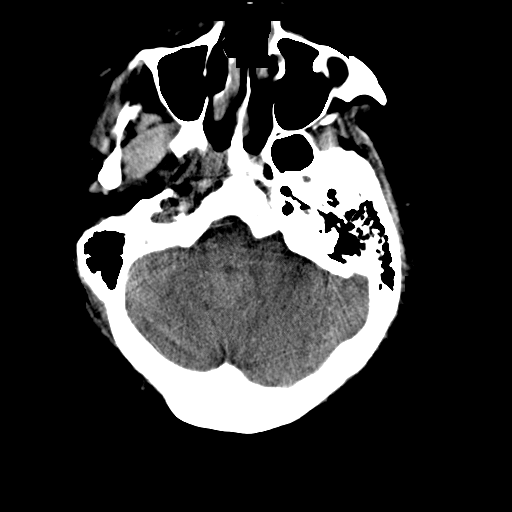
[im 8/30  brain]
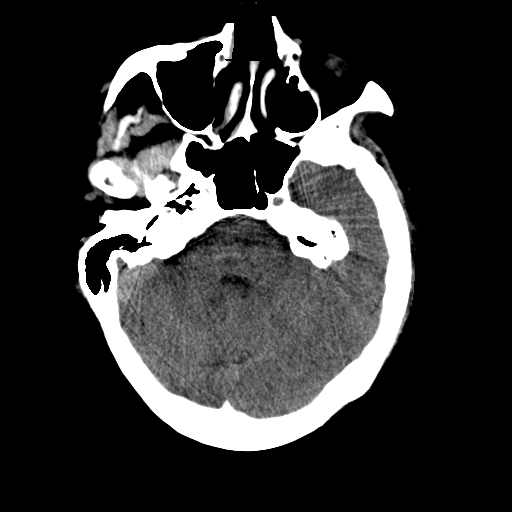
[im 9/30  brain]
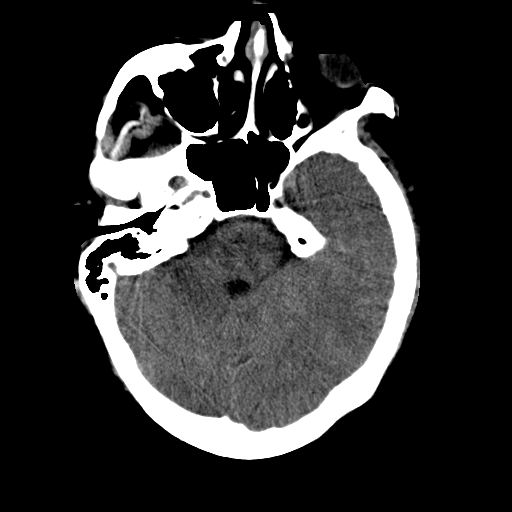
[im 9/30  bone]
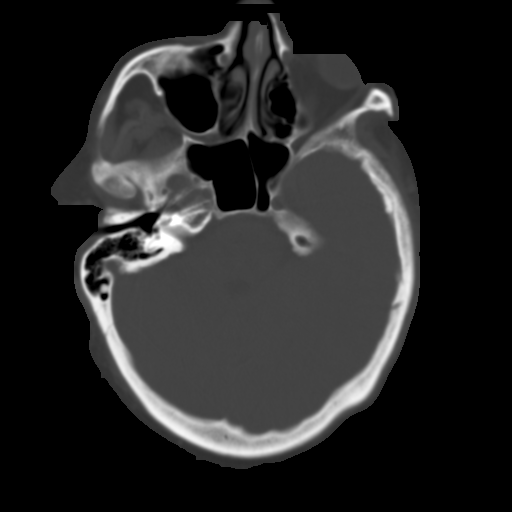
[im 11/30  brain]
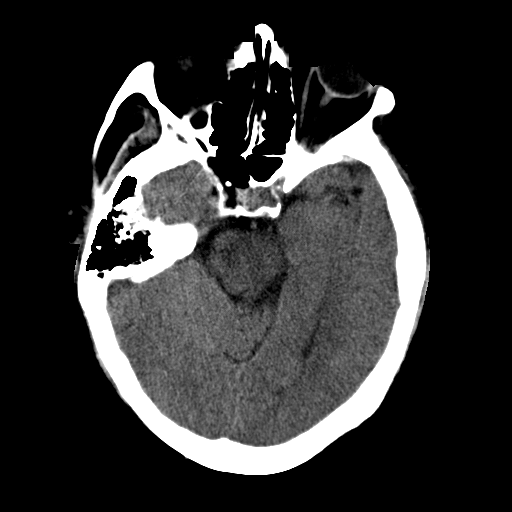
[im 13/30  brain]
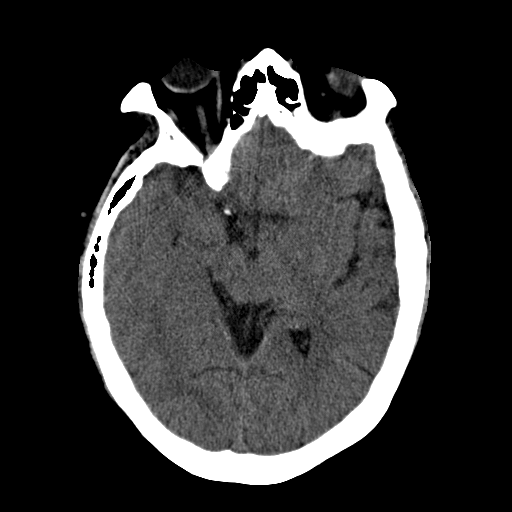
[im 15/30  brain]
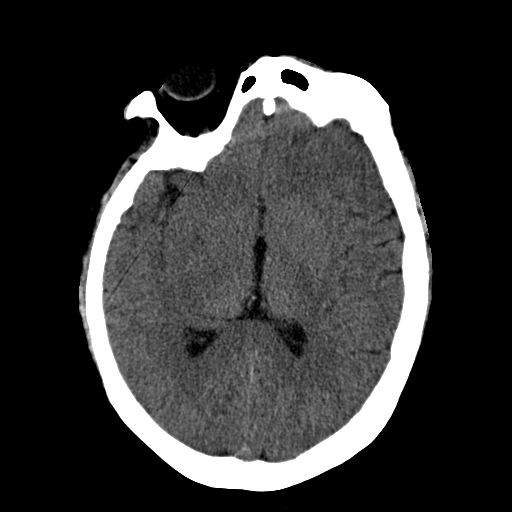
[im 16/30  brain]
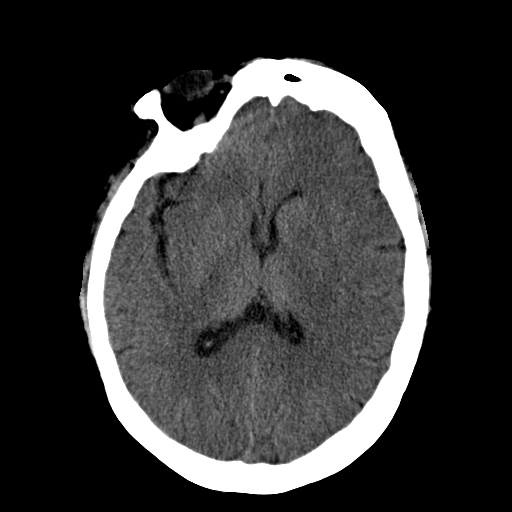
[im 16/30  bone]
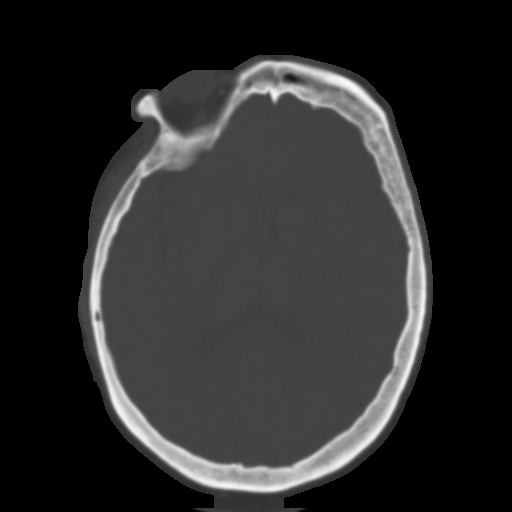
[im 18/30  brain]
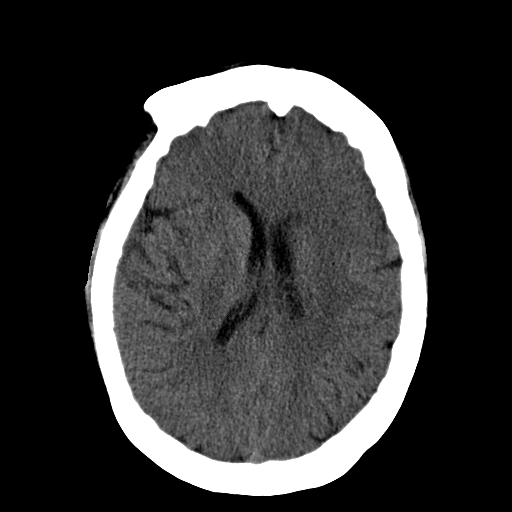
[im 20/30  brain]
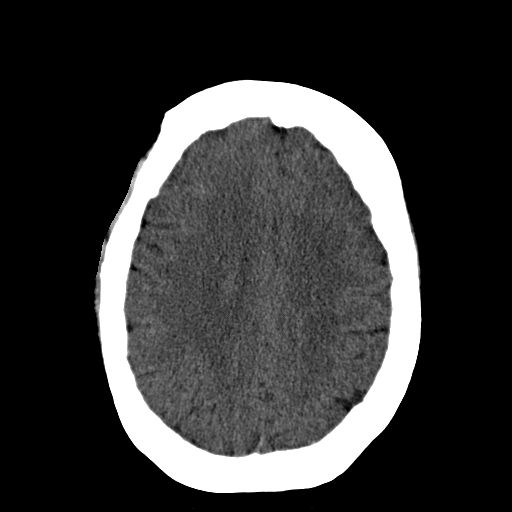
[im 22/30  brain]
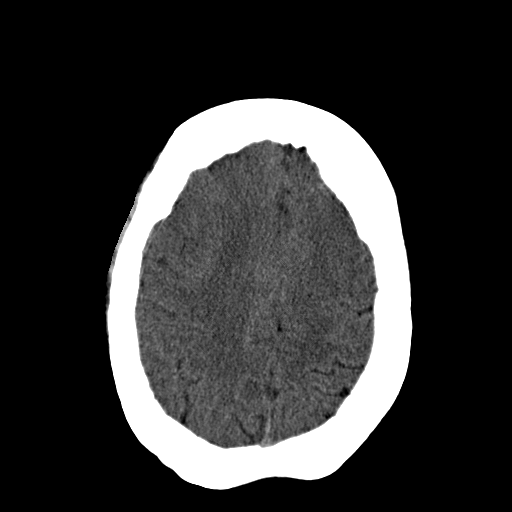
[im 23/30  brain]
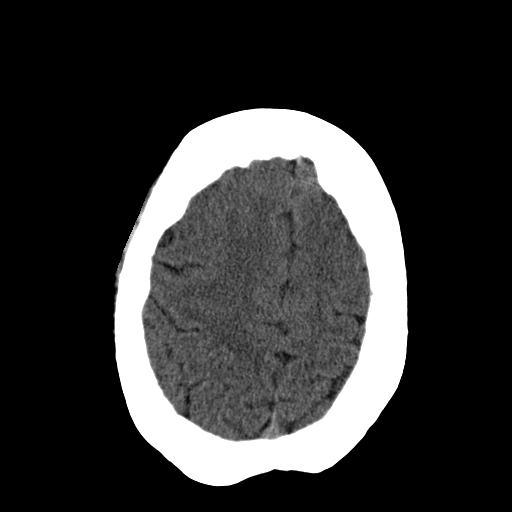
[im 23/30  bone]
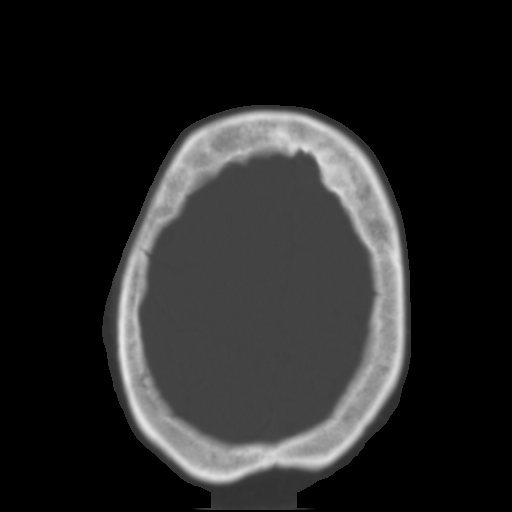
[im 25/30  brain]
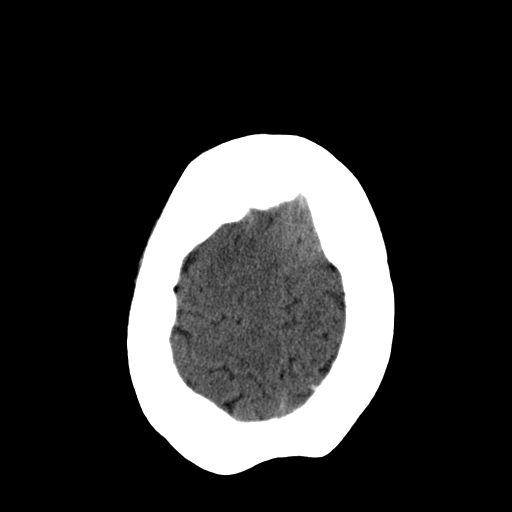
[im 27/30  brain]
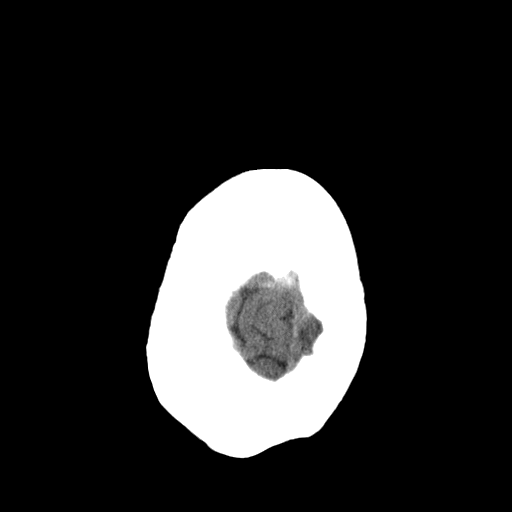
[im 29/30  brain]
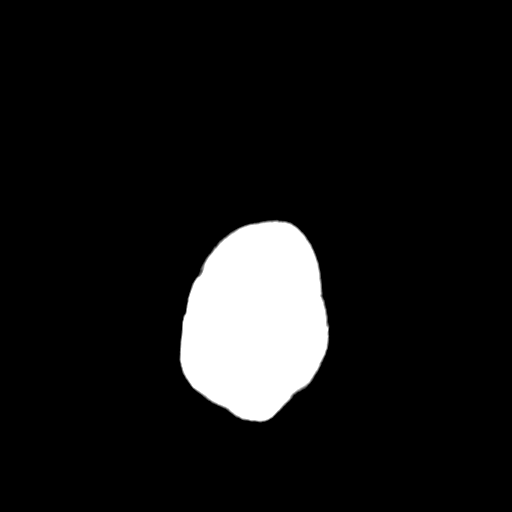

[16 of 30 positions shown; findings below may reference images not displayed]

## 2005-03-04 IMAGING — MR MR HEAD WO/W CM
8 of 9 series · 33 of 48 positions shown · IV contrast (multihance)
Comparison: 10/13/02.

CLINICAL DATA: Generalized weakness, dysphagia, TIAs, question stroke. 
MRI BRAIN WITH AND WITHOUT CONTRAST, MRA HEAD WITHOUT CONTRAST 
MRI BRAIN WITH AND WITHOUT CONTRAST
Multiplanar MR imaging brain performed before and after 15 cc Multihance.

[Series 2: DWI · axial · 5.0mm · 0.84mm/px · z∈[-47,+82]mm · 4 of 25 slices shown (1 of 2)]
[im 1/25]
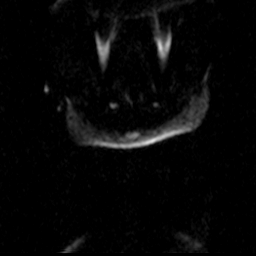
[im 9/25]
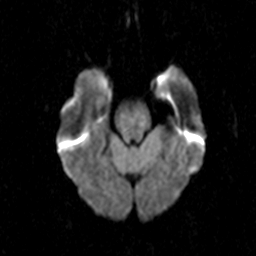
[im 17/25]
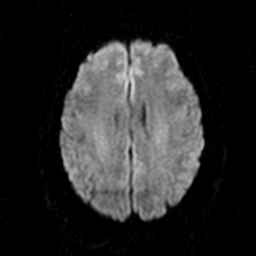
[im 25/25]
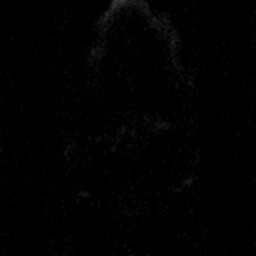

[Series 3: DWI · axial · 5.0mm · 0.85mm/px · z∈[-47,+82]mm · 4 of 25 slices shown (2 of 2)]
[im 1/25]
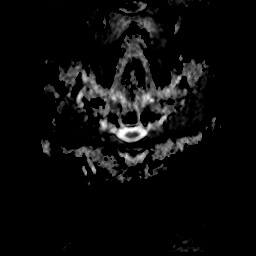
[im 9/25]
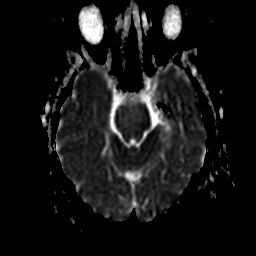
[im 17/25]
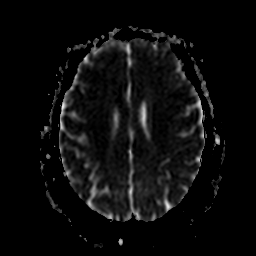
[im 25/25]
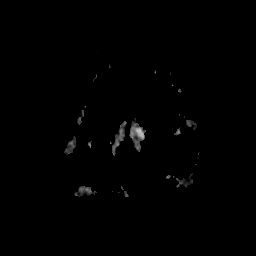

[Series 7: T1 · sagittal · 5.0mm · 0.72mm/px · 2 of 12 slices shown]
[im 1/12]
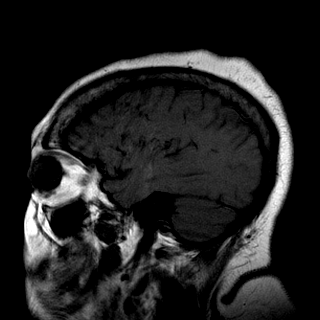
[im 12/12]
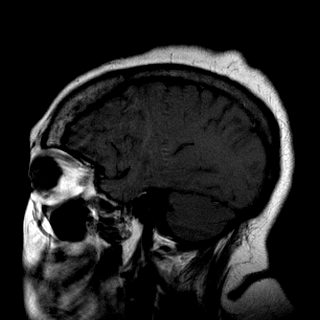

[Series 8: T2 · axial · 5.0mm · 0.56mm/px · z∈[-51,+84]mm · 3 of 24 slices shown]
[im 1/24]
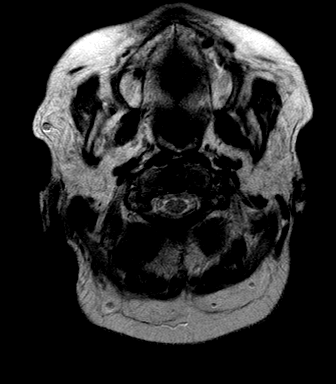
[im 12/24]
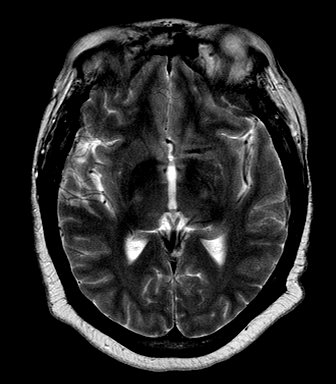
[im 24/24]
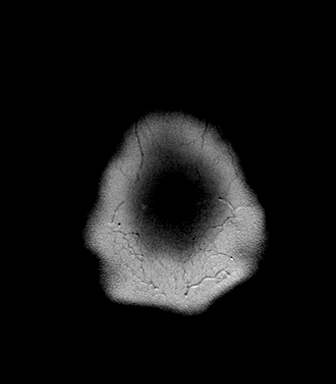

[Series 10: FLAIR · axial · 5.0mm · 0.59mm/px · z∈[-48,+87]mm · 3 of 24 slices shown]
[im 1/24]
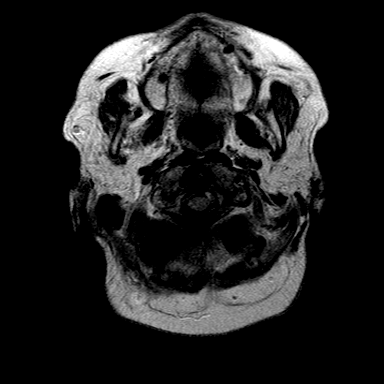
[im 12/24]
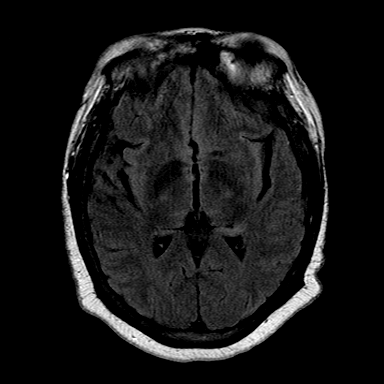
[im 24/24]
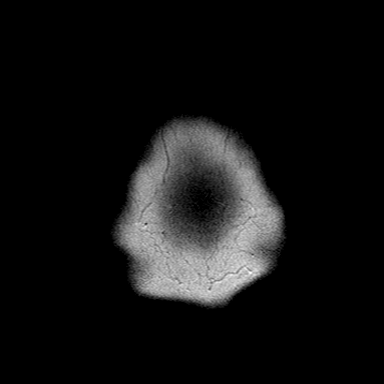

[Series 14: T1 dynamic · axial · 3.0mm · 0.59mm/px · z∈[-59,+94]mm · 7 of 53 slices shown]
[im 1/53]
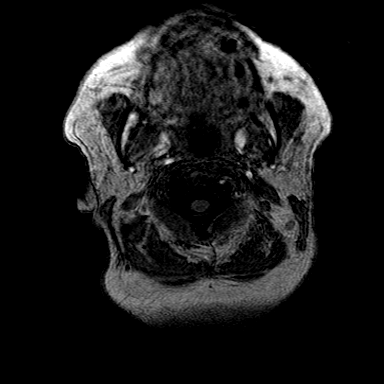
[im 9/53]
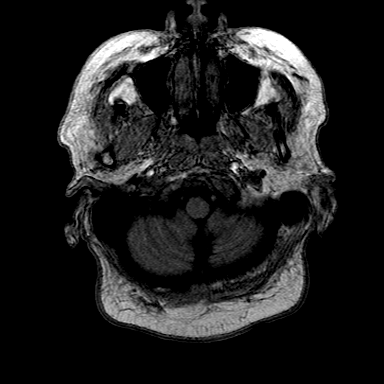
[im 18/53]
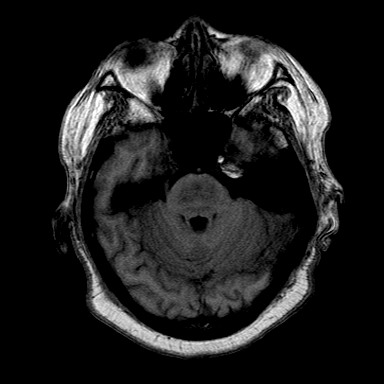
[im 27/53]
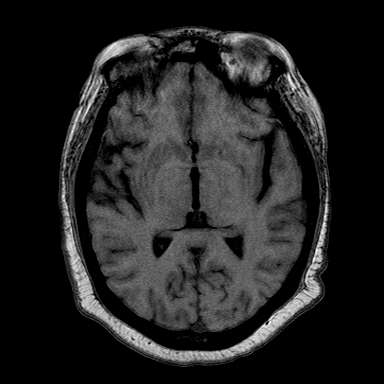
[im 35/53]
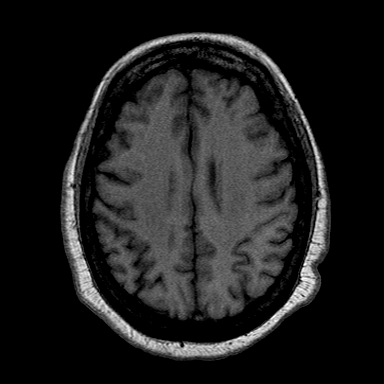
[im 44/53]
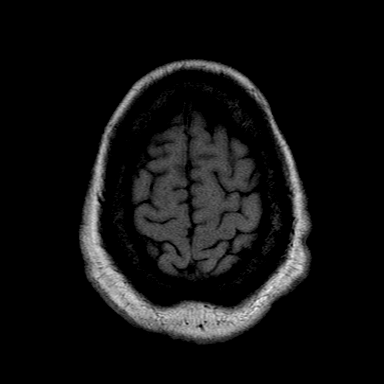
[im 53/53]
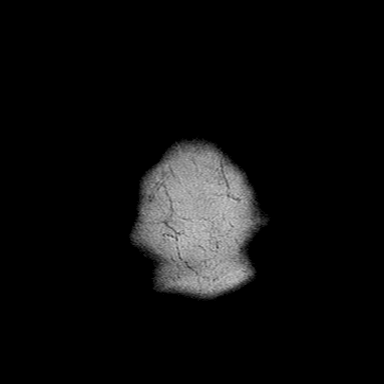

[Series 17: T1 post-contrast · axial · 3.0mm · 0.59mm/px · z∈[-59,+91]mm · 7 of 52 slices shown (1 of 2)]
[im 1/52]
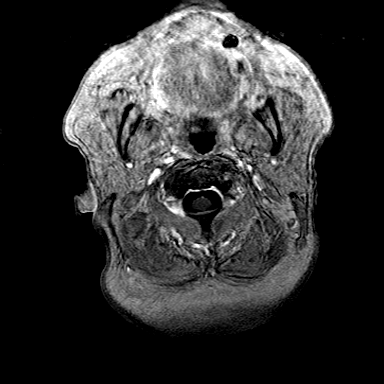
[im 9/52]
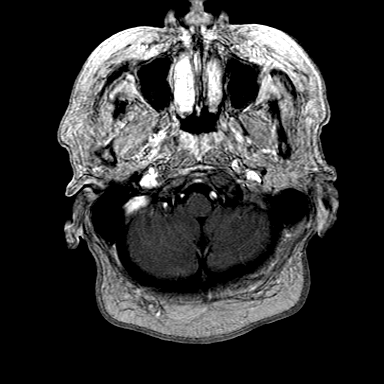
[im 18/52]
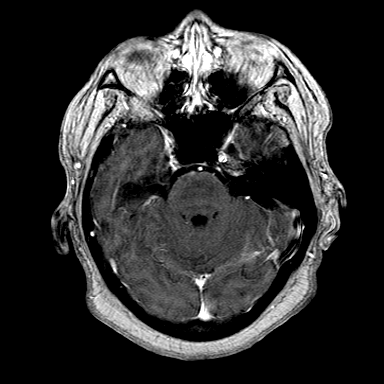
[im 26/52]
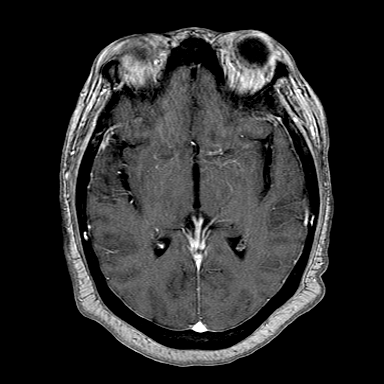
[im 35/52]
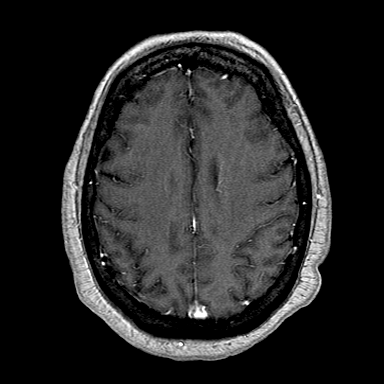
[im 43/52]
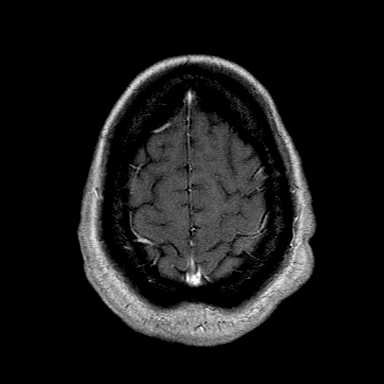
[im 52/52]
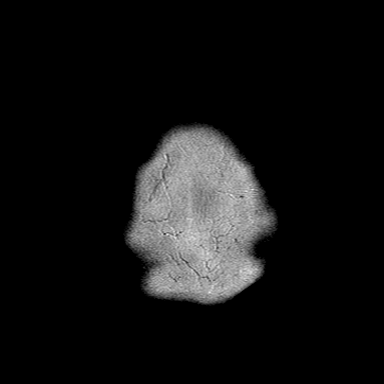

[Series 18: T1 post-contrast · coronal · 5.0mm · 0.56mm/px · 3 of 24 slices shown (2 of 2)]
[im 1/24]
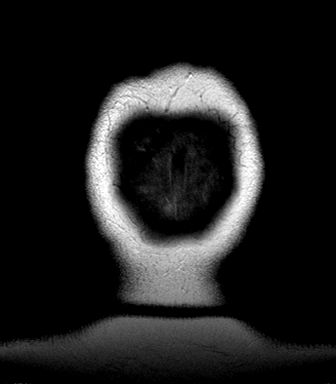
[im 12/24]
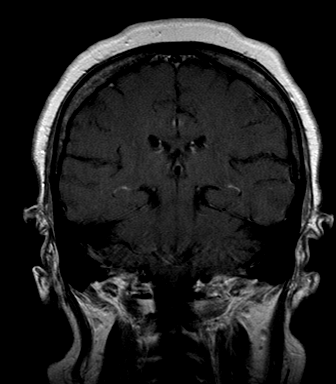
[im 24/24]
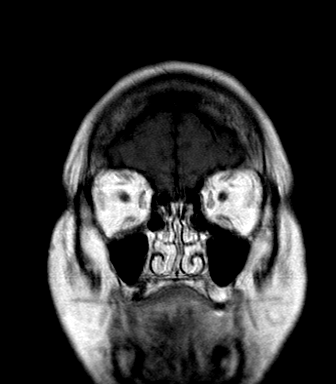

[33 of 48 positions shown; findings below may reference images not displayed]

FINDINGS: Abnormal increased signal throughout the pons similar to previous exam.  Signal intensity within remaining brain parenchyma is normal and unchanged.  No evidence of mass, hemorrhage, or infarct.  Diffusion-weighted image is normal.  No extra-axial fluid collection.  Minimal atrophy, compatible with age.  Posterior fossa otherwise unremarkable.  Sella turcica, parasellar regions, and basilar cisterns normal appearance.  Flow voids present in the major intracranial vessels.  No significant extracranial abnormalities.  No pathologic intracranial enhancement following contrast.  
IMPRESSION
Abnormal white matter signal throughout the pons bilaterally, unchanged since previous exam.  Differential diagnosis includes central pontine myelinolysis, pontine small vessel ischemia, multiple sclerosis, unlikely mass/glioma due to lack of mass effect and lack of enhancement.  Remainder of exam unremarkable.
MRA BRAIN WITHOUT CONTRAST
3-D time of flight non-contrast MRA imaging of the brain performed.  Axial data set reformatted into sagittal and coronal images which are viewed independently in multiple projections.
FINDINGS: Congenital absence A1 segment right anterior cerebral artery with patent anterior communicating artery and bilateral anterior cerebral arteries.  Patent left posterior communicating artery.  Congenital normal variant fetal origin right posterior cerebral artery from internal carotid artery.  No evidence of vascular occlusion, aneurysm, or stenosis.  Minimal irregularity of the internal carotid siphons at skull base.  There is questionably an area of focal narrowing at the distal basilar artery.  Left vertebral dominance of posterior fossa system. 
IMPRESSION
Fetal origin right posterior cerebral artery.  Congenital absence A1 segment right anterior cerebral artery.  Focal area of questionable stenosis distal basilar artery.  Remainder of exam unremarkable.

## 2005-03-29 ENCOUNTER — Ambulatory Visit: Payer: Self-pay | Admitting: Family Medicine

## 2005-04-19 ENCOUNTER — Ambulatory Visit: Payer: Self-pay | Admitting: Family Medicine

## 2005-05-03 ENCOUNTER — Ambulatory Visit (HOSPITAL_COMMUNITY): Admission: RE | Admit: 2005-05-03 | Discharge: 2005-05-03 | Payer: Self-pay | Admitting: *Deleted

## 2005-05-10 HISTORY — PX: CARDIAC CATHETERIZATION: SHX172

## 2005-05-24 ENCOUNTER — Ambulatory Visit: Payer: Self-pay | Admitting: Family Medicine

## 2005-05-26 ENCOUNTER — Ambulatory Visit: Payer: Self-pay | Admitting: Internal Medicine

## 2005-06-07 ENCOUNTER — Ambulatory Visit: Payer: Self-pay | Admitting: Internal Medicine

## 2005-06-08 ENCOUNTER — Ambulatory Visit (HOSPITAL_COMMUNITY): Admission: RE | Admit: 2005-06-08 | Discharge: 2005-06-08 | Payer: Self-pay | Admitting: Internal Medicine

## 2005-06-08 ENCOUNTER — Ambulatory Visit: Payer: Self-pay | Admitting: Internal Medicine

## 2005-06-08 ENCOUNTER — Encounter (INDEPENDENT_AMBULATORY_CARE_PROVIDER_SITE_OTHER): Payer: Self-pay | Admitting: *Deleted

## 2005-06-22 ENCOUNTER — Ambulatory Visit: Payer: Self-pay | Admitting: Internal Medicine

## 2005-06-29 ENCOUNTER — Ambulatory Visit (HOSPITAL_COMMUNITY): Admission: RE | Admit: 2005-06-29 | Discharge: 2005-06-29 | Payer: Self-pay | Admitting: Internal Medicine

## 2005-06-30 ENCOUNTER — Ambulatory Visit: Payer: Self-pay | Admitting: Family Medicine

## 2005-07-06 ENCOUNTER — Ambulatory Visit: Payer: Self-pay | Admitting: Family Medicine

## 2005-07-06 ENCOUNTER — Emergency Department (HOSPITAL_COMMUNITY): Admission: EM | Admit: 2005-07-06 | Discharge: 2005-07-06 | Payer: Self-pay | Admitting: Emergency Medicine

## 2005-07-15 IMAGING — MR MR CERVICAL SPINE W/O CM
4 of 7 series · 25 of 48 positions shown · IV contrast (omniscan)
Comparison: none

CLINICAL DATA: Neck pain, ataxia, dysarthria, gait and movement disorder. 
MR OF THE CERVICAL SPINE AND MRA OF THE NECK WITH CONTRAST
TECHNIQUE: Multiplanar, multisequence images of the cervical spine performed.  MRA of the neck performed following the administration of 20 cc intravenous Omniscan.

[Series 3: T2 · sagittal · 3.0mm · 0.35mm/px · 6 of 12 slices shown (1 of 3)]
[im 1/12]
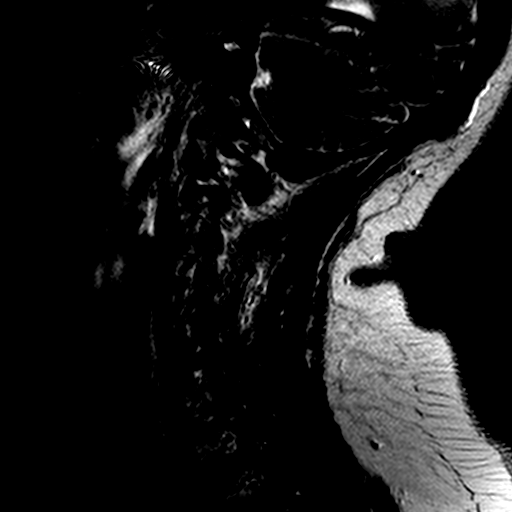
[im 3/12]
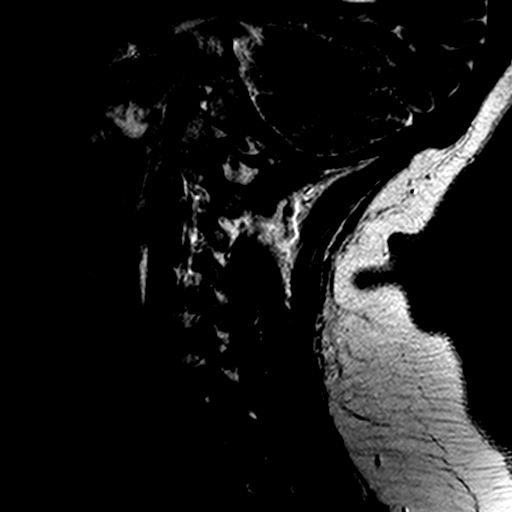
[im 5/12]
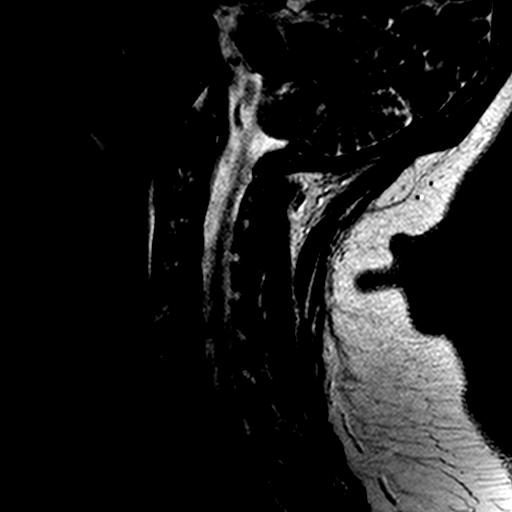
[im 7/12]
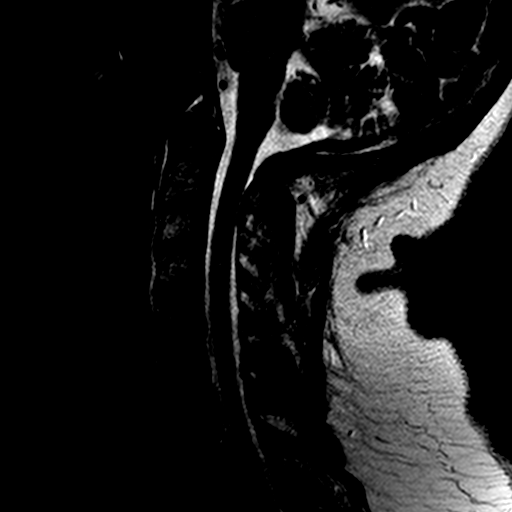
[im 9/12]
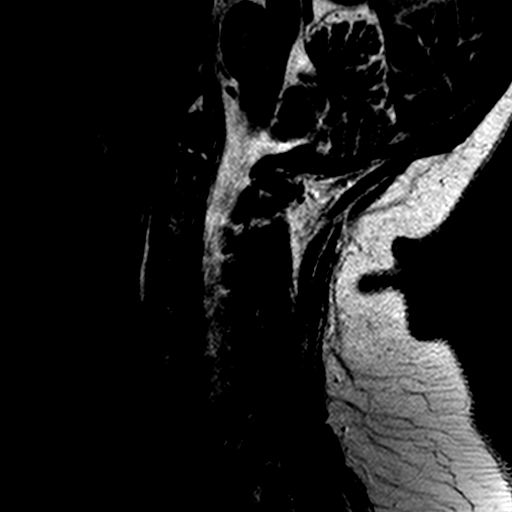
[im 12/12]
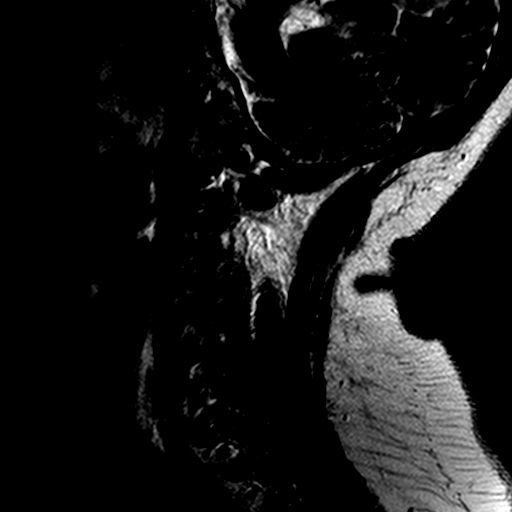

[Series 4: FLAIR · sagittal · 3.0mm · 0.35mm/px · 3 of 12 slices shown]
[im 1/12]
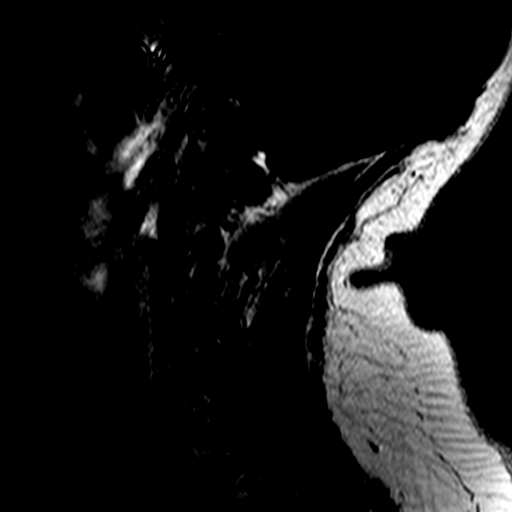
[im 6/12]
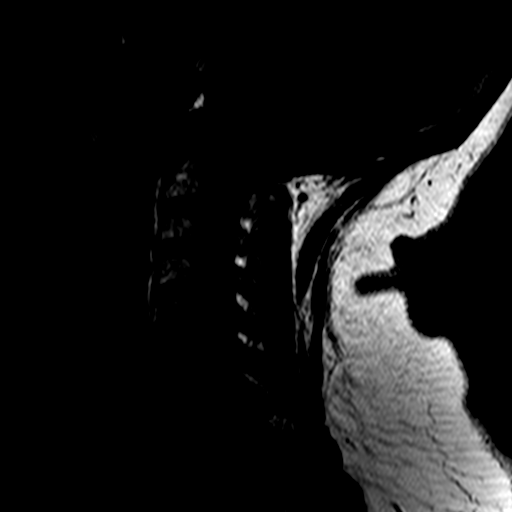
[im 12/12]
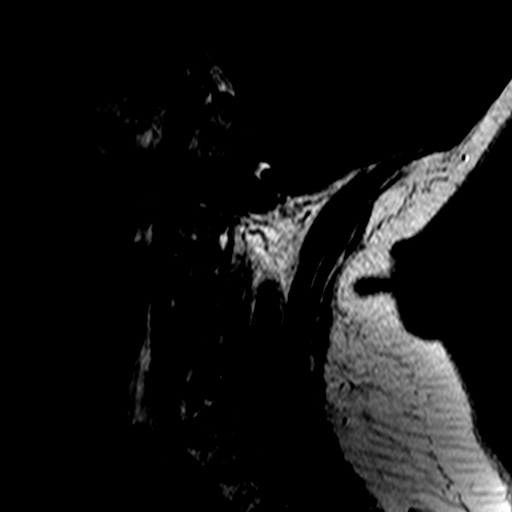

[Series 5: T2 · sagittal · 3.0mm · 0.35mm/px · 5 of 12 slices shown (2 of 3)]
[im 1/12]
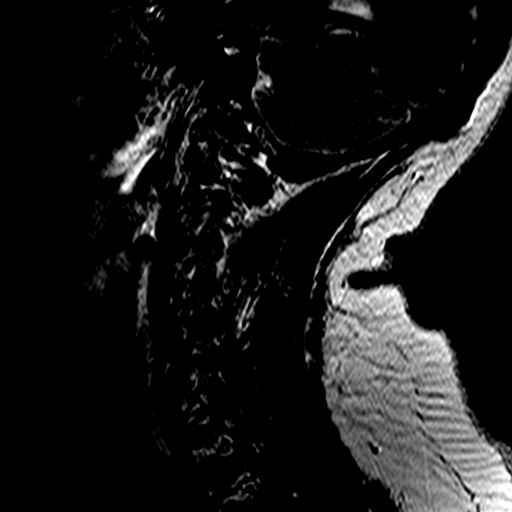
[im 3/12]
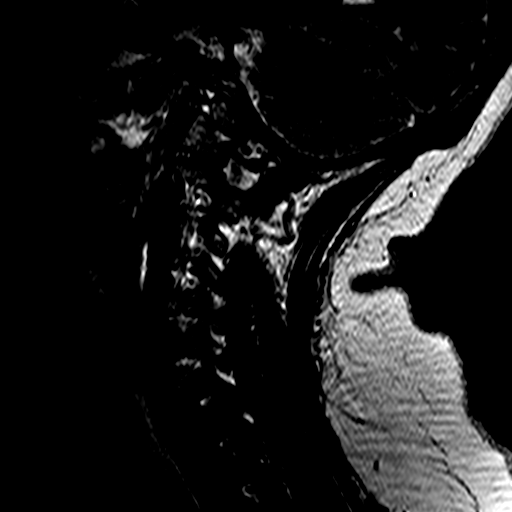
[im 6/12]
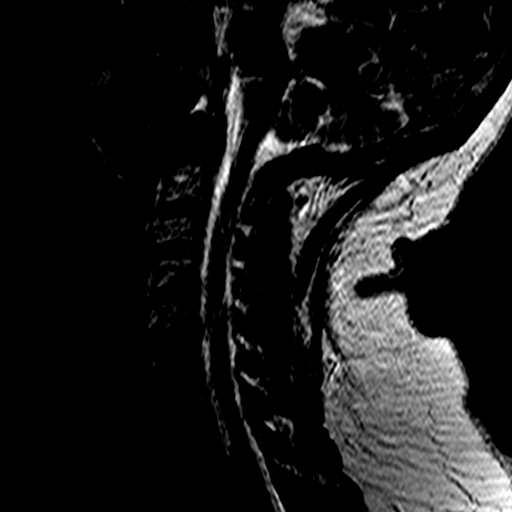
[im 9/12]
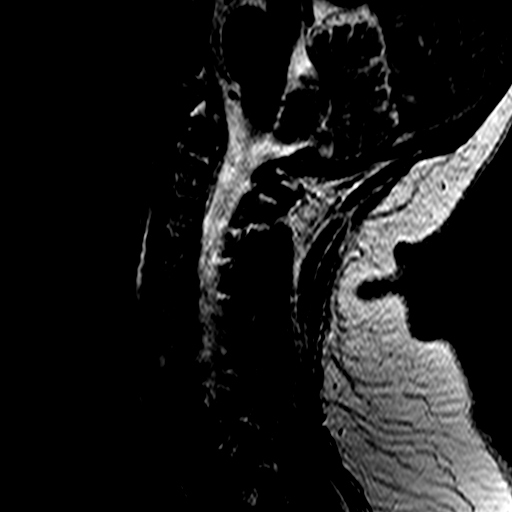
[im 12/12]
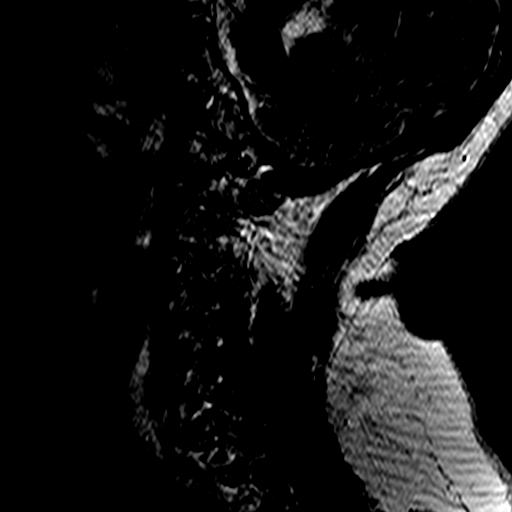

[Series 7: T2 · axial · 3.0mm · 0.48mm/px · z∈[-56,+31]mm · 11 of 24 slices shown (3 of 3)]
[im 1/24]
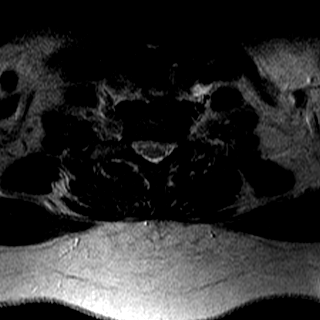
[im 3/24]
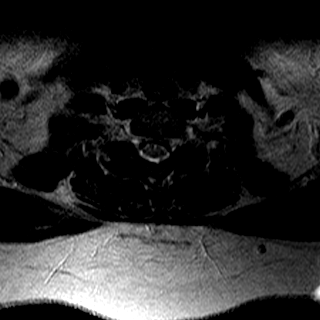
[im 5/24]
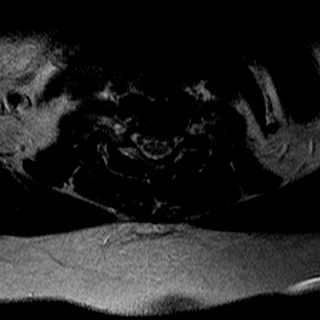
[im 7/24]
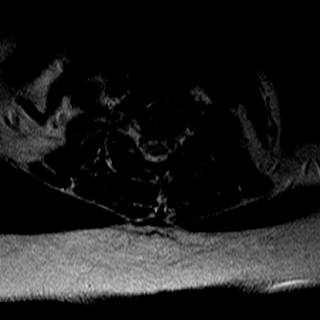
[im 10/24]
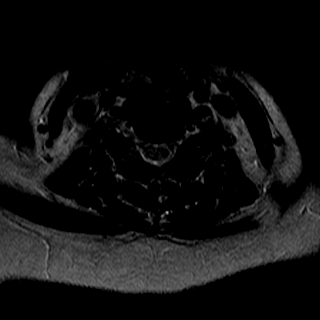
[im 12/24]
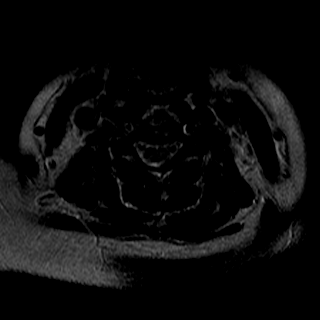
[im 14/24]
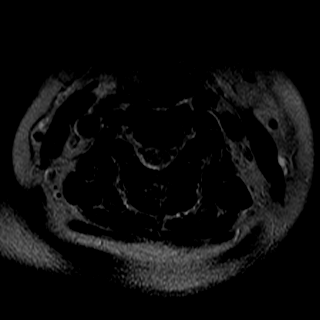
[im 17/24]
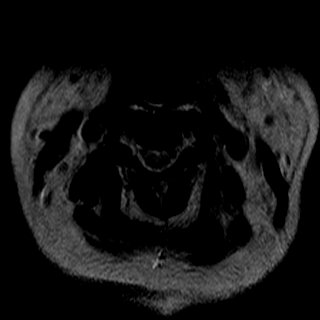
[im 19/24]
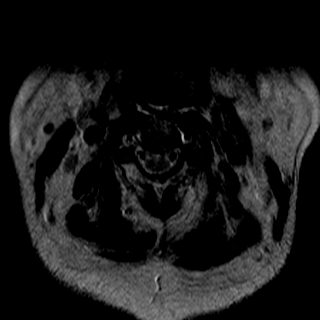
[im 21/24]
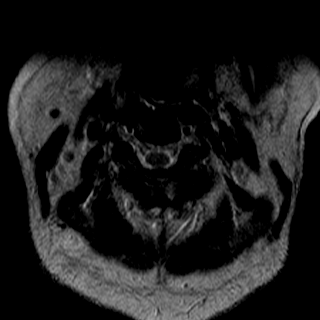
[im 24/24]
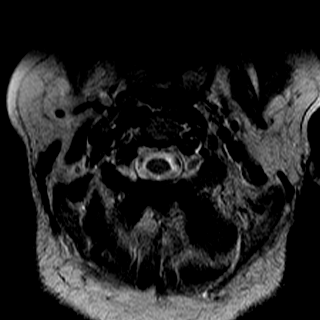

[25 of 48 positions shown; findings below may reference images not displayed]

FINDINGS: Increased STIR and T2- signal within the pons is noted.  The remainder of the visualized posterior fossa is unremarkable.  Cervicomedullary junction is unremarkable.  Normal cervical alignment is noted without focal bony lesions.  ild congenital central spinal stenosis throughout the cervical spine from short pedicles noted. Minimal disc bulge at C5-6, C6-7, and C7-T1 noted.  No abnormal cord signal or cord abnormality.  Mild uncovertebral spurring contributes to mild left foraminal narrowing at C4-5, C5-6, and C6-7. 
IMPRESSION
Abnormal signal within the pons. This may represent chronic ischemic changes vs acute abnormality including central pontine myelolysis.  Recommend dedicated imaging of the brain as indicated.  
Mild congenital central spinal stenosis.  
Minimal disc bulges in the lower cervical spine with mild uncovertebral spurring contributing to mild left foraminal narrowing from C-4 to C-7.  
MRA NECK 
Dominant left vertebral system is noted.  There is mild tortuosity of the origin of the left vertebral artery but no evidence of significant stenosis within the carotid or vertebral artery systems in the neck.  No other significant abnormality is identified.
IMPRESSION
Mild tortuosity of the origin of the left vertebral artery with left vertebral dominant system.  No evidence of significant stenosis.

## 2005-07-15 IMAGING — MR MR MRA NECK W/ CM
1 series · 13 of 48 positions shown · IV contrast (20cc Omniscan)
Comparison: none

CLINICAL DATA: Neck pain, ataxia, dysarthria, gait and movement disorder. 
MR OF THE CERVICAL SPINE AND MRA OF THE NECK WITH CONTRAST
TECHNIQUE: Multiplanar, multisequence images of the cervical spine performed.  MRA of the neck performed following the administration of 20 cc intravenous Omniscan.

[Series 10: [id number] · coronal · 1.0mm · 0.40mm/px · 13 of 79 slices shown]
[im 1/79]
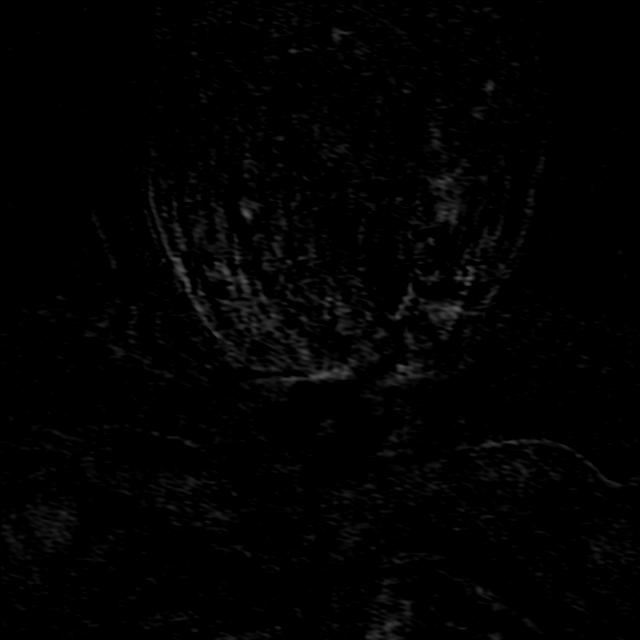
[im 2/79]
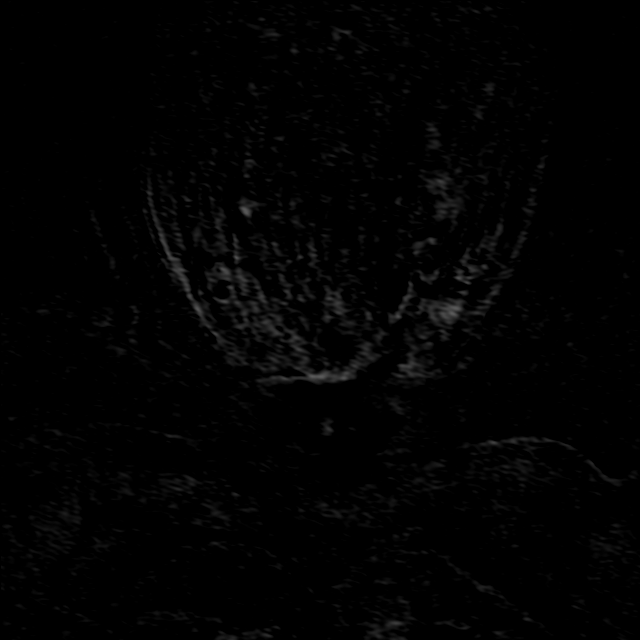
[im 5/79]
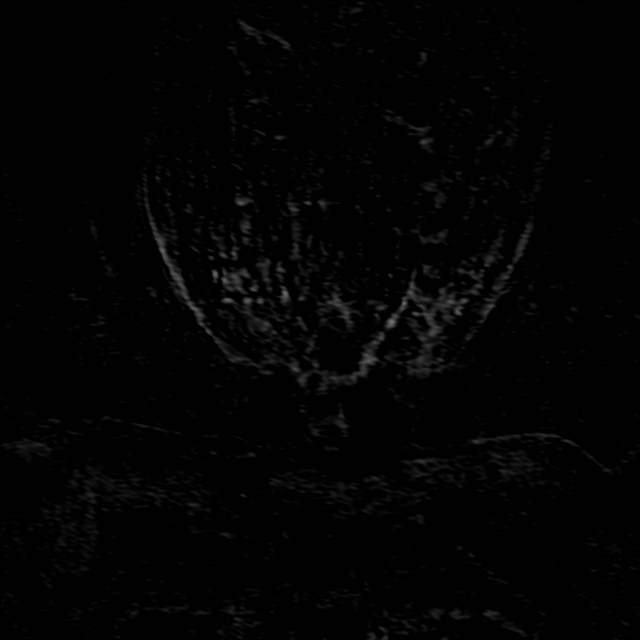
[im 14/79]
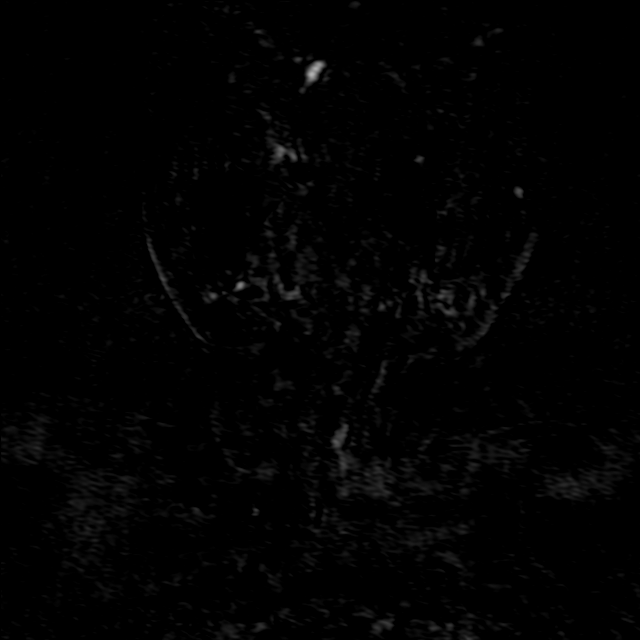
[im 15/79]
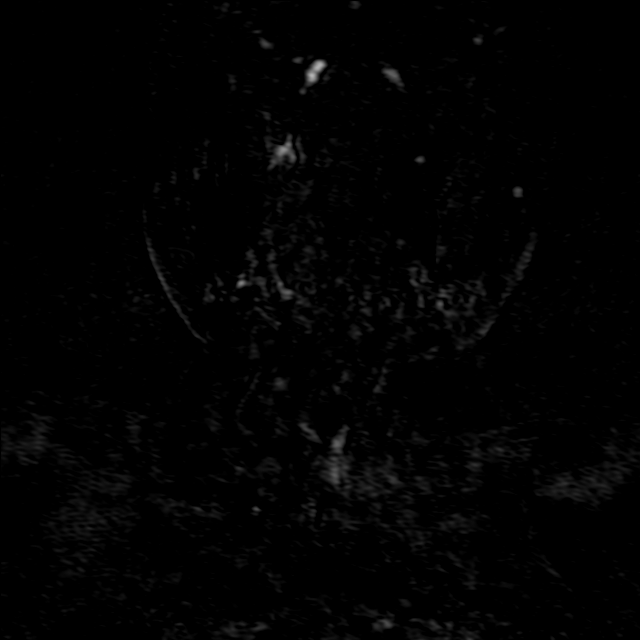
[im 25/79]
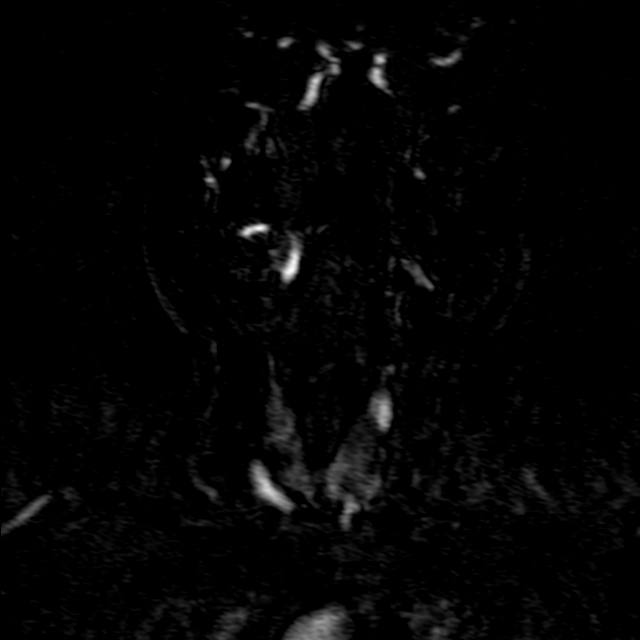
[im 35/79]
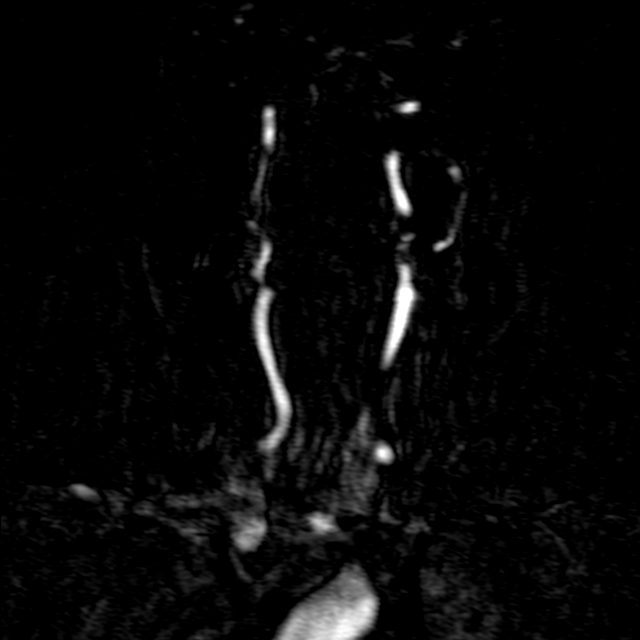
[im 40/79]
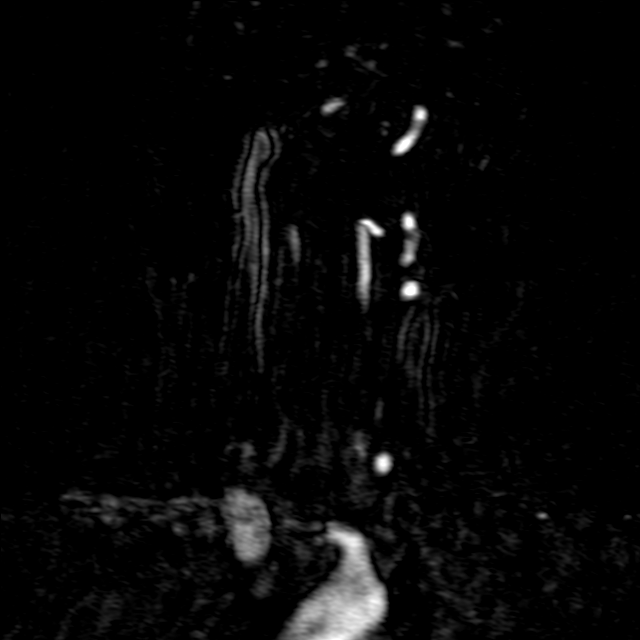
[im 45/79]
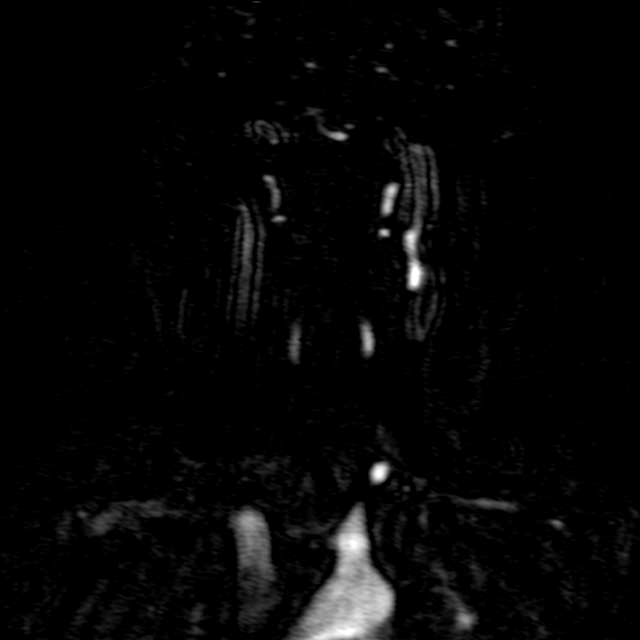
[im 55/79]
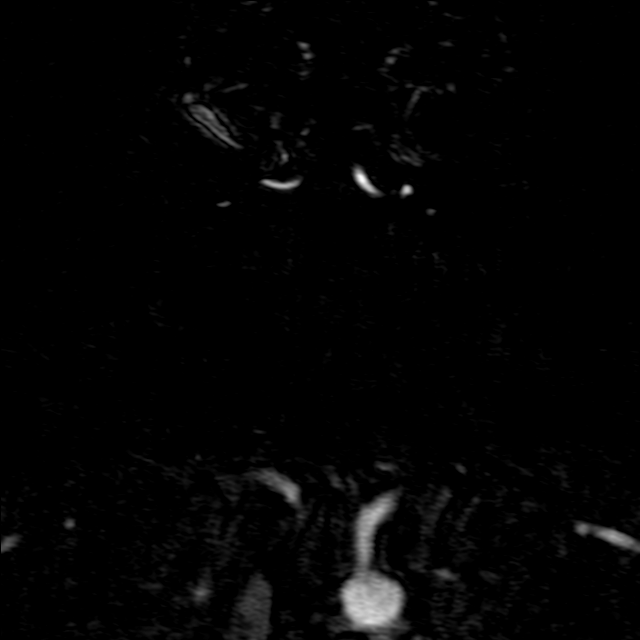
[im 65/79]
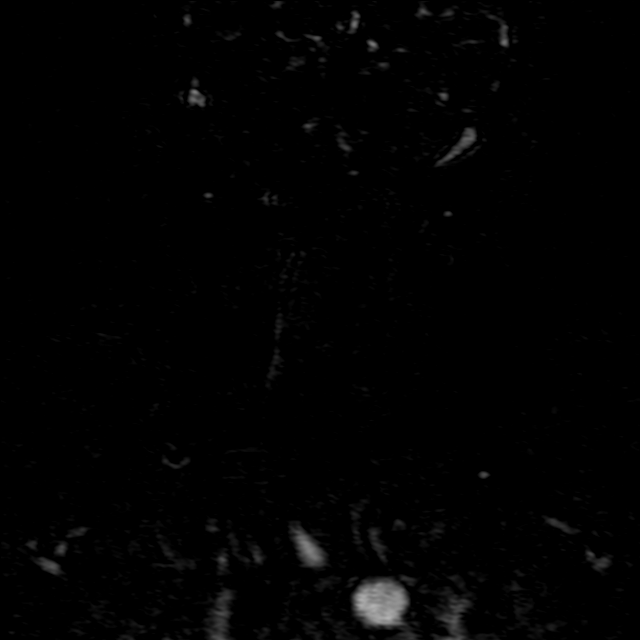
[im 67/79]
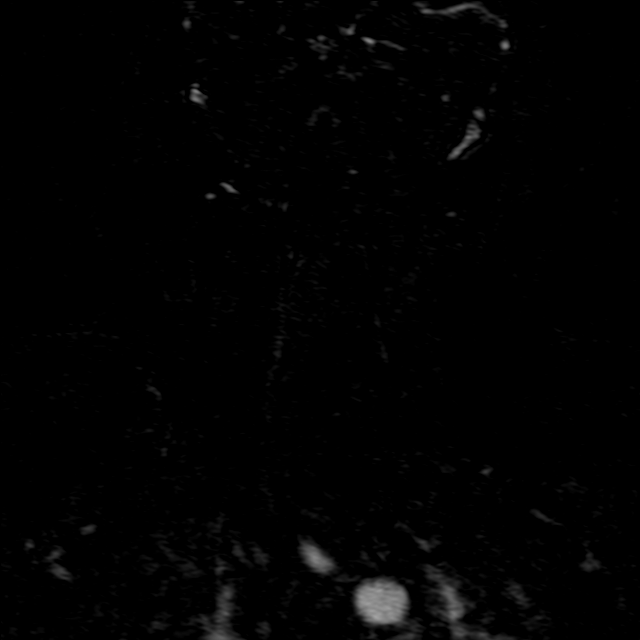
[im 75/79]
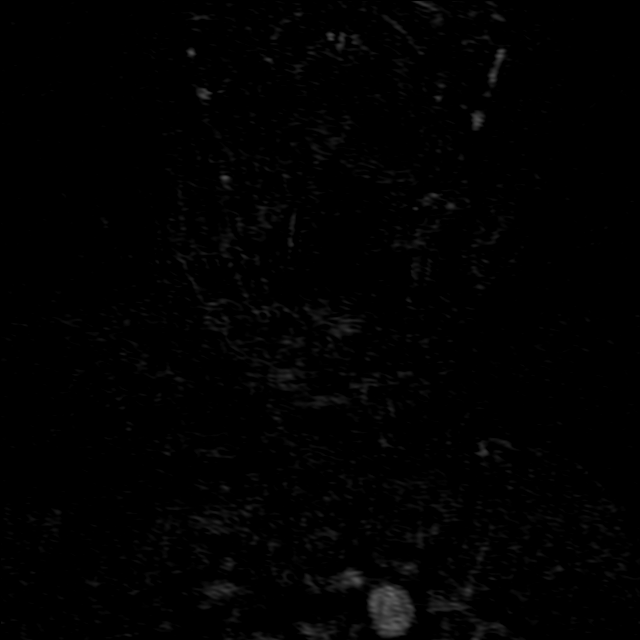

[13 of 48 positions shown; findings below may reference images not displayed]

FINDINGS: Increased STIR and T2- signal within the pons is noted.  The remainder of the visualized posterior fossa is unremarkable.  Cervicomedullary junction is unremarkable.  Normal cervical alignment is noted without focal bony lesions.  ild congenital central spinal stenosis throughout the cervical spine from short pedicles noted. Minimal disc bulge at C5-6, C6-7, and C7-T1 noted.  No abnormal cord signal or cord abnormality.  Mild uncovertebral spurring contributes to mild left foraminal narrowing at C4-5, C5-6, and C6-7. 
IMPRESSION
Abnormal signal within the pons. This may represent chronic ischemic changes vs acute abnormality including central pontine myelolysis.  Recommend dedicated imaging of the brain as indicated.  
Mild congenital central spinal stenosis.  
Minimal disc bulges in the lower cervical spine with mild uncovertebral spurring contributing to mild left foraminal narrowing from C-4 to C-7.  
MRA NECK 
Dominant left vertebral system is noted.  There is mild tortuosity of the origin of the left vertebral artery but no evidence of significant stenosis within the carotid or vertebral artery systems in the neck.  No other significant abnormality is identified.
IMPRESSION
Mild tortuosity of the origin of the left vertebral artery with left vertebral dominant system.  No evidence of significant stenosis.

## 2005-07-17 ENCOUNTER — Ambulatory Visit: Payer: Self-pay | Admitting: Family Medicine

## 2005-07-31 ENCOUNTER — Ambulatory Visit: Payer: Self-pay | Admitting: Internal Medicine

## 2005-09-07 ENCOUNTER — Ambulatory Visit: Payer: Self-pay | Admitting: Family Medicine

## 2005-09-14 ENCOUNTER — Emergency Department (HOSPITAL_COMMUNITY): Admission: EM | Admit: 2005-09-14 | Discharge: 2005-09-14 | Payer: Self-pay | Admitting: Emergency Medicine

## 2005-09-25 IMAGING — CR DG CHEST 2V
2 series · 2 of 2 positions shown · non-contrast
Comparison: none

CLINICAL DATA: Right rib pain.  The patient had a fall 4 days ago, continuing to have pain. 
 TWO VIEW CHEST:
   PA and lateral views of the chest are made and are compared to previous studies of 08/09/03 and again show bilateral basilar atelectasis and/or scarring which has changed little since the previous study.  The bony thorax shows a mild thoracolumbar scoliosis and anterior degenerative hypertrophic spurring.   The heart is minimally prominent in size without evidence of edema or acute infiltrate. There are metallic clips in the upper abdomen consistent with previous cholecystectomy.

[view not recorded (1 of 2)]
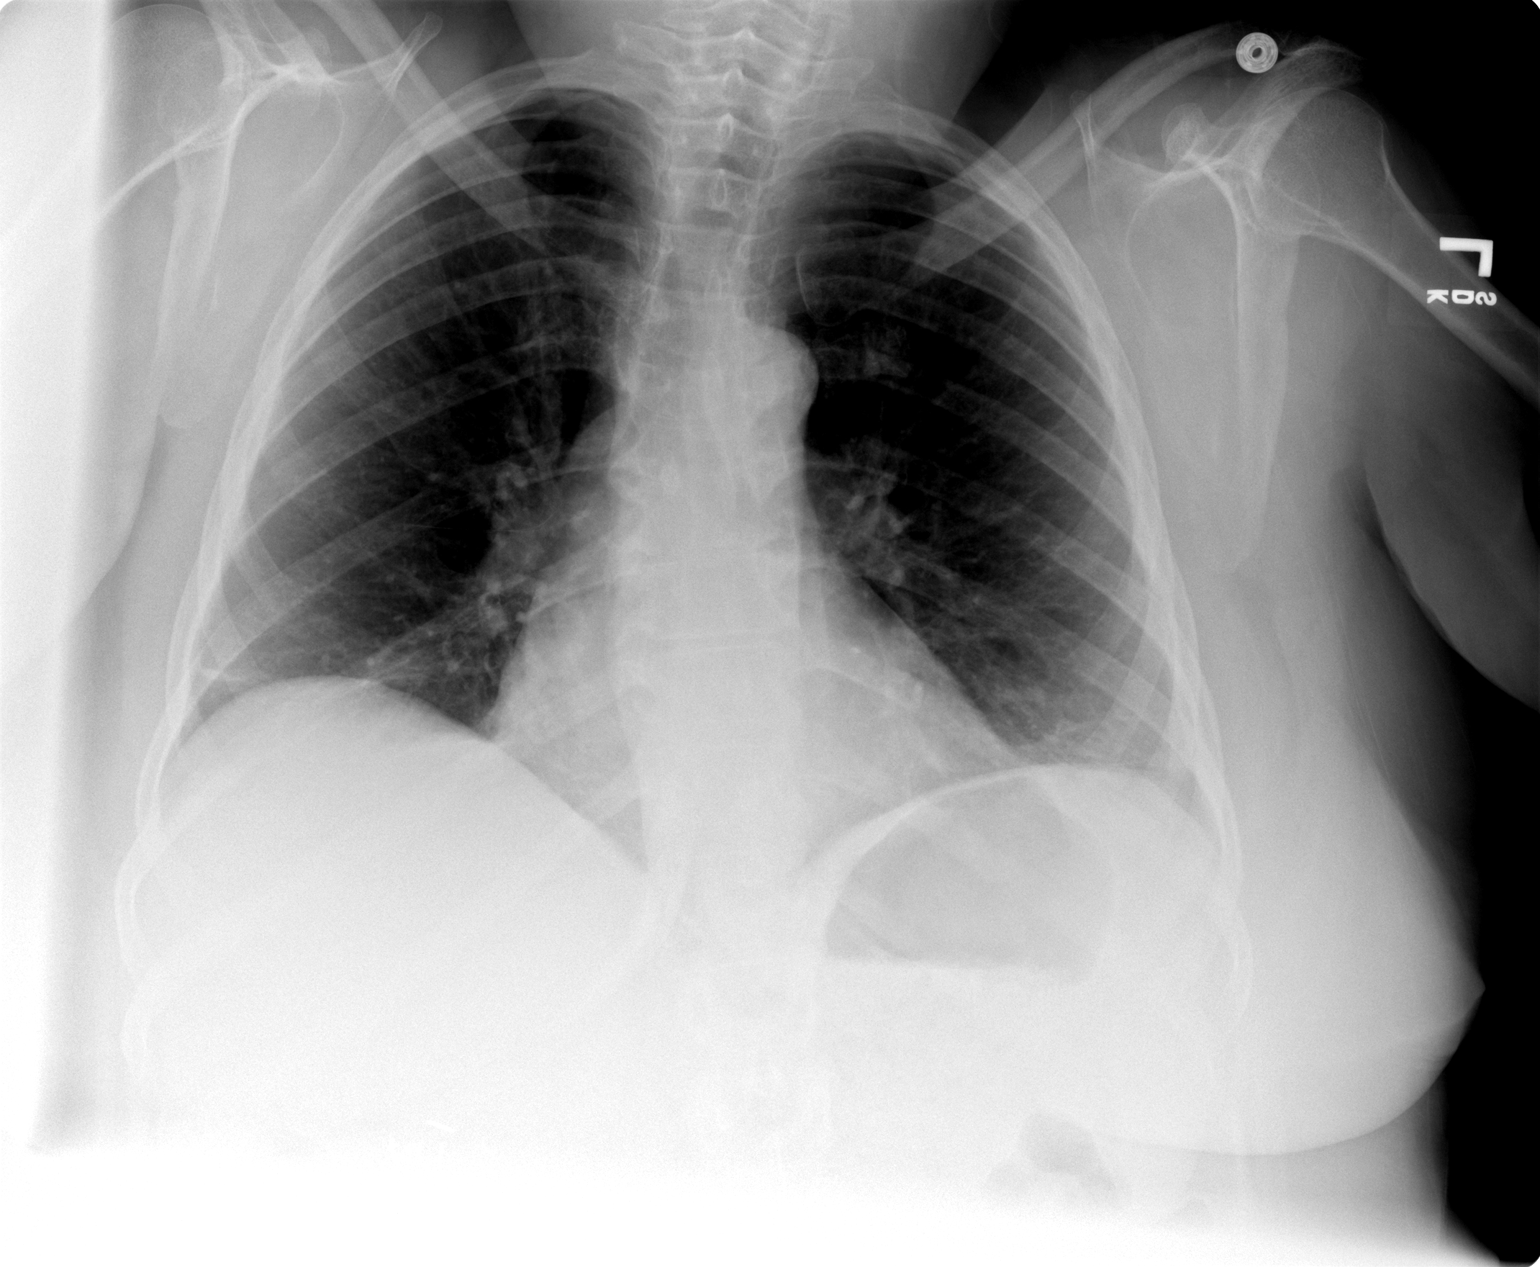

[view not recorded (2 of 2)]
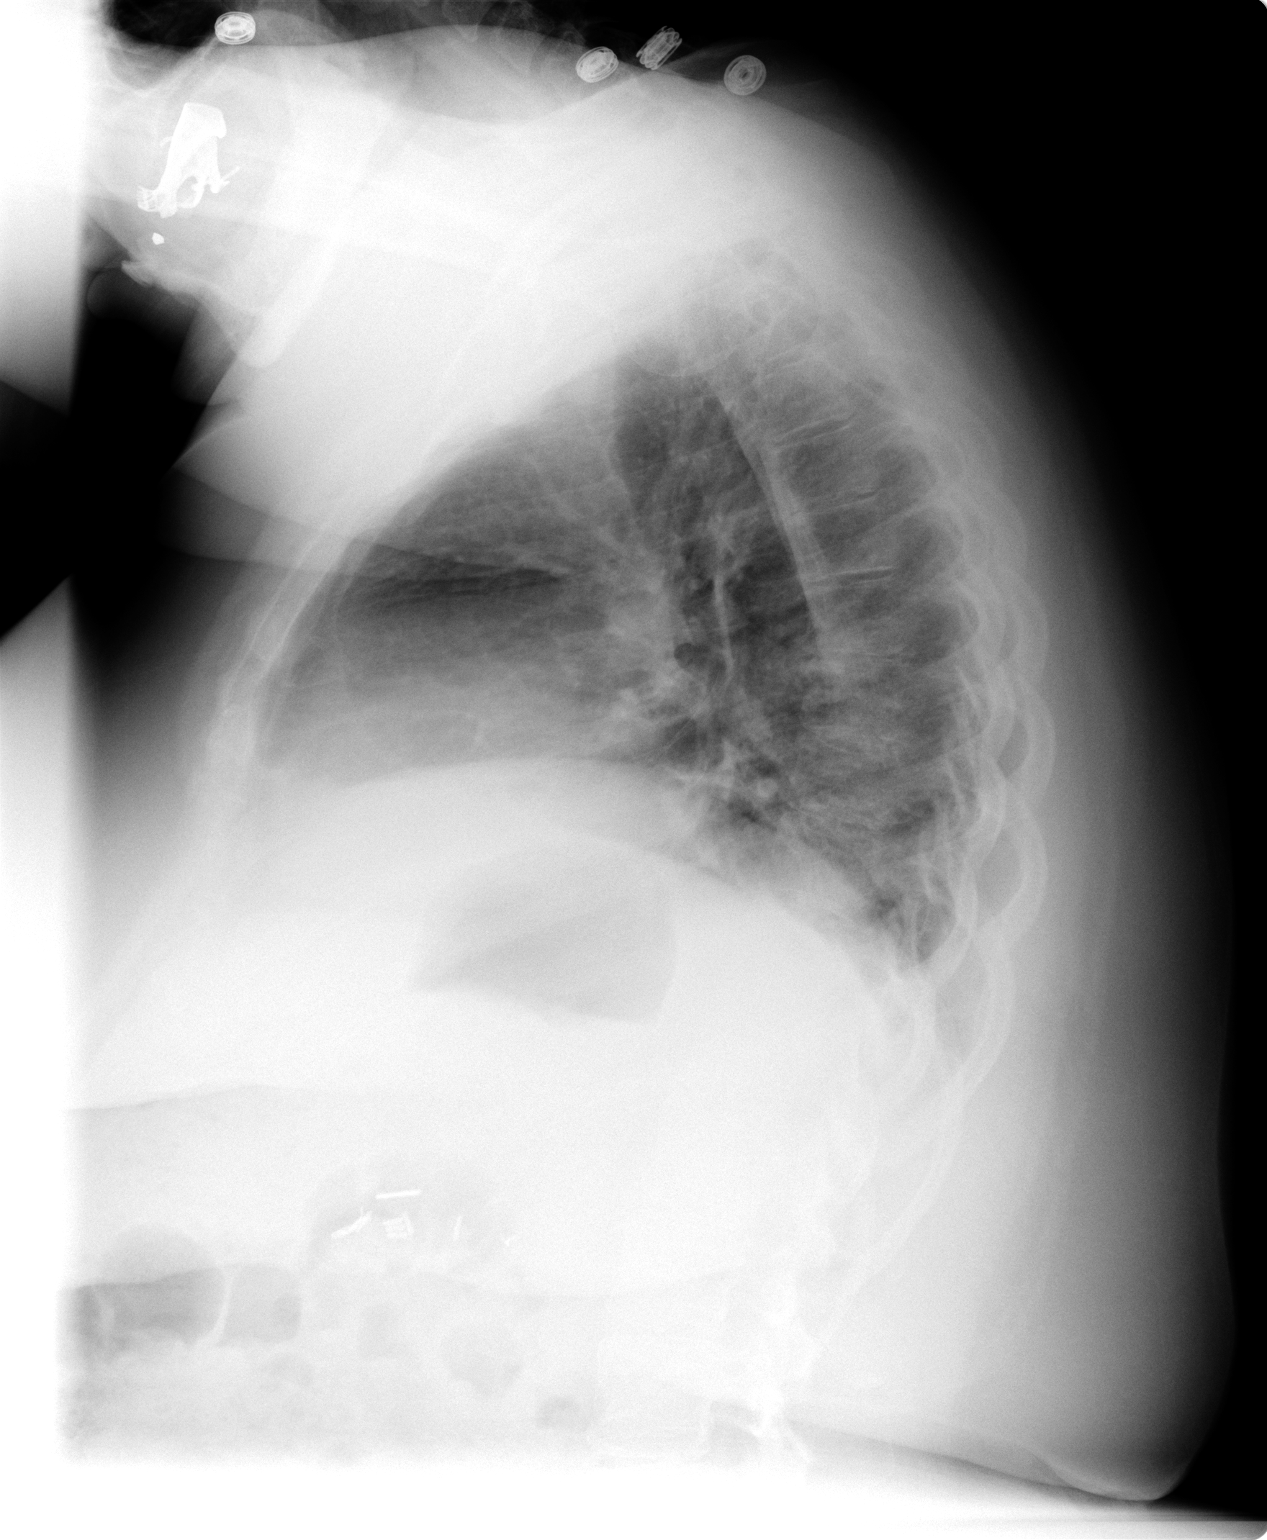

[2 of 2 positions shown; findings below may reference images not displayed]

IMPRESSION: Bilateral basilar atelectasis and/or scarring which appears to be stable.  Mild cardiomegaly.  Mild degenerative changes thoracic spine. No definite rib fracture or pneumothorax is seen.

## 2005-09-27 ENCOUNTER — Ambulatory Visit: Payer: Self-pay | Admitting: Internal Medicine

## 2005-09-29 ENCOUNTER — Inpatient Hospital Stay (HOSPITAL_COMMUNITY): Admission: EM | Admit: 2005-09-29 | Discharge: 2005-10-05 | Payer: Self-pay | Admitting: Emergency Medicine

## 2005-10-23 ENCOUNTER — Ambulatory Visit: Payer: Self-pay | Admitting: Family Medicine

## 2005-11-02 ENCOUNTER — Ambulatory Visit: Payer: Self-pay | Admitting: Family Medicine

## 2005-12-06 ENCOUNTER — Ambulatory Visit: Payer: Self-pay | Admitting: Family Medicine

## 2005-12-26 ENCOUNTER — Encounter: Admission: RE | Admit: 2005-12-26 | Discharge: 2005-12-26 | Payer: Self-pay | Admitting: Family Medicine

## 2006-01-03 ENCOUNTER — Ambulatory Visit: Payer: Self-pay | Admitting: Family Medicine

## 2006-01-09 IMAGING — NM NM GASTRIC EMPTYING
1 series · 1 of 1 positions shown · non-contrast
Comparison: none

CLINICAL DATA: Diabetes.  Gastroparesis.
 RADIONUCLIDE GASTRIC EMPTYING STUDY:
 Patient ingested 2.2 mCi of QQmRc sulfur colloid cooked in one egg served over toast with 4 oz of water.  At 1 hour, there is 55.3% residua in the stomach and at 2 hours there is 43.7% residua.  Normally there is less than 30% residua.

[gasey · 0.80mm/px · 1 of 1 slices shown]
[im 1/1]
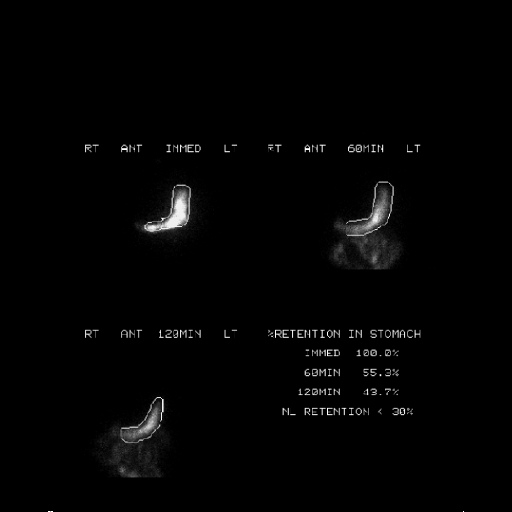

[1 of 1 positions shown; findings below may reference images not displayed]

IMPRESSION: Abnormal gastric emptying study with approximately 44% residua in the stomach at 2 hours.

## 2006-01-11 ENCOUNTER — Ambulatory Visit: Payer: Self-pay | Admitting: Family Medicine

## 2006-01-16 ENCOUNTER — Encounter: Admission: RE | Admit: 2006-01-16 | Discharge: 2006-01-16 | Payer: Self-pay | Admitting: Family Medicine

## 2006-01-17 ENCOUNTER — Ambulatory Visit: Payer: Self-pay | Admitting: Family Medicine

## 2006-01-18 ENCOUNTER — Ambulatory Visit (HOSPITAL_COMMUNITY): Admission: RE | Admit: 2006-01-18 | Discharge: 2006-01-18 | Payer: Self-pay | Admitting: Family Medicine

## 2006-01-19 ENCOUNTER — Inpatient Hospital Stay (HOSPITAL_COMMUNITY): Admission: EM | Admit: 2006-01-19 | Discharge: 2006-01-22 | Payer: Self-pay | Admitting: Emergency Medicine

## 2006-01-31 ENCOUNTER — Ambulatory Visit: Payer: Self-pay | Admitting: Orthopedic Surgery

## 2006-02-06 ENCOUNTER — Ambulatory Visit: Payer: Self-pay | Admitting: Family Medicine

## 2006-02-22 ENCOUNTER — Ambulatory Visit: Payer: Self-pay | Admitting: Family Medicine

## 2006-03-01 ENCOUNTER — Ambulatory Visit: Payer: Self-pay | Admitting: Family Medicine

## 2006-03-08 ENCOUNTER — Encounter: Admission: RE | Admit: 2006-03-08 | Discharge: 2006-03-08 | Payer: Self-pay | Admitting: Unknown Physician Specialty

## 2006-04-16 ENCOUNTER — Encounter: Admission: RE | Admit: 2006-04-16 | Discharge: 2006-04-16 | Payer: Self-pay | Admitting: Family Medicine

## 2006-05-01 ENCOUNTER — Inpatient Hospital Stay (HOSPITAL_COMMUNITY): Admission: EM | Admit: 2006-05-01 | Discharge: 2006-05-07 | Payer: Self-pay | Admitting: Emergency Medicine

## 2006-05-01 ENCOUNTER — Ambulatory Visit: Payer: Self-pay | Admitting: Internal Medicine

## 2006-05-11 ENCOUNTER — Ambulatory Visit (HOSPITAL_COMMUNITY): Admission: RE | Admit: 2006-05-11 | Discharge: 2006-05-11 | Payer: Self-pay | Admitting: Family Medicine

## 2006-05-15 ENCOUNTER — Ambulatory Visit: Payer: Self-pay | Admitting: Internal Medicine

## 2006-05-22 ENCOUNTER — Ambulatory Visit: Payer: Self-pay | Admitting: Family Medicine

## 2006-05-23 ENCOUNTER — Encounter: Payer: Self-pay | Admitting: Family Medicine

## 2006-05-23 LAB — CONVERTED CEMR LAB
Alkaline Phosphatase: 86 units/L (ref 39–117)
Basophils Relative: 0 % (ref 0–1)
Bilirubin, Direct: 0.1 mg/dL (ref 0.0–0.3)
Cholesterol: 171 mg/dL (ref 0–200)
Eosinophils Absolute: 0.2 10*3/uL (ref 0.0–0.7)
Eosinophils Relative: 3 % (ref 0–5)
Indirect Bilirubin: 0.3 mg/dL (ref 0.0–0.9)
Iron: 82 ug/dL (ref 42–145)
LDL Cholesterol: 89 mg/dL (ref 0–99)
MCHC: 30 g/dL (ref 30.0–36.0)
MCV: 77.8 fL — ABNORMAL LOW (ref 78.0–100.0)
Monocytes Relative: 9 % (ref 3–11)
Neutrophils Relative %: 32 % — ABNORMAL LOW (ref 43–77)
Platelets: 178 10*3/uL (ref 150–400)
Saturation Ratios: 22 % (ref 20–55)
TIBC: 371 ug/dL (ref 250–470)
Total Protein: 6.5 g/dL (ref 6.0–8.3)
Triglycerides: 139 mg/dL (ref <150)
UIBC: 289 ug/dL
Vitamin B-12: 562 pg/mL (ref 211–911)

## 2006-06-06 ENCOUNTER — Ambulatory Visit: Payer: Self-pay | Admitting: Family Medicine

## 2006-06-07 ENCOUNTER — Encounter: Payer: Self-pay | Admitting: Gastroenterology

## 2006-06-07 ENCOUNTER — Ambulatory Visit (HOSPITAL_COMMUNITY): Admission: RE | Admit: 2006-06-07 | Discharge: 2006-06-07 | Payer: Self-pay | Admitting: Gastroenterology

## 2006-06-11 ENCOUNTER — Observation Stay (HOSPITAL_COMMUNITY): Admission: EM | Admit: 2006-06-11 | Discharge: 2006-06-12 | Payer: Self-pay | Admitting: Emergency Medicine

## 2006-06-13 ENCOUNTER — Ambulatory Visit: Payer: Self-pay | Admitting: Gastroenterology

## 2006-06-14 ENCOUNTER — Ambulatory Visit: Payer: Self-pay | Admitting: Family Medicine

## 2006-07-05 ENCOUNTER — Ambulatory Visit: Payer: Self-pay | Admitting: Family Medicine

## 2006-07-22 IMAGING — CT CT HEAD W/O CM
1 series · 16 of 30 positions shown, 20 images · IV contrast (agent unspecified)
Comparison: 12/19/03.

CLINICAL DATA: Disoriented, left-sided weakness. History of stroke. 
 HEAD CT WITHOUT CONTRAST:
TECHNIQUE: Contiguous axial images were obtained from the base of the skull through the vertex according to standard protocol without contrast.
 The brain does not show generalized atrophy. There is no identifiable focal stroke.  No sign of mass hemorrhage, hydrocephalus or extra-axial collection. The calvarium is unremarkable.   The paranasal sinuses, middle ears and mastoids are clear.

[Series 2621: — · axial · 0.49mm/px · z∈[-742,-592]mm · 16 of 34 slices shown, 20 images]
[im 2/34  brain]
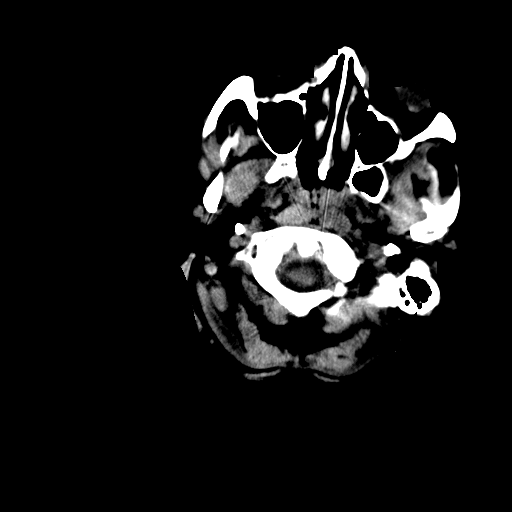
[im 2/34  bone]
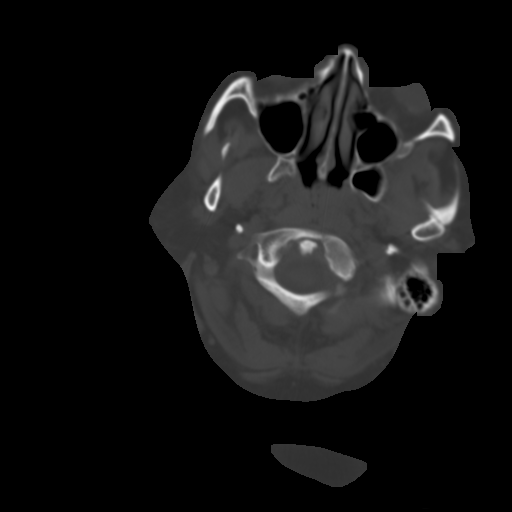
[im 4/34  brain]
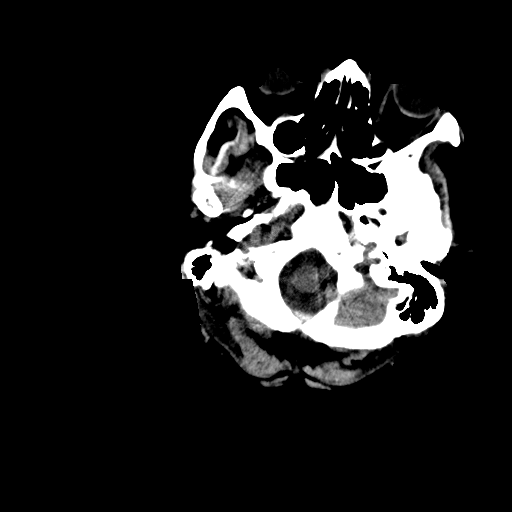
[im 6/34  brain]
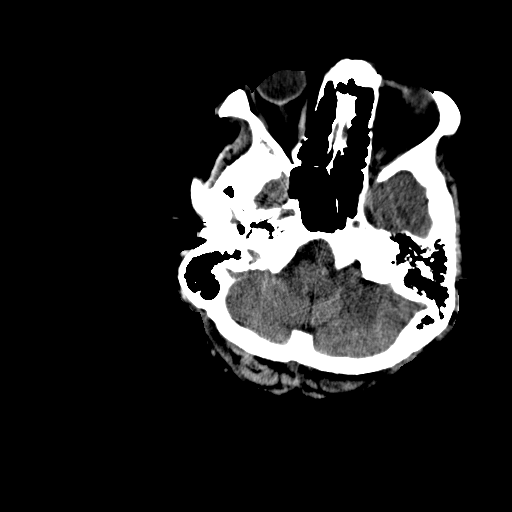
[im 8/34  brain]
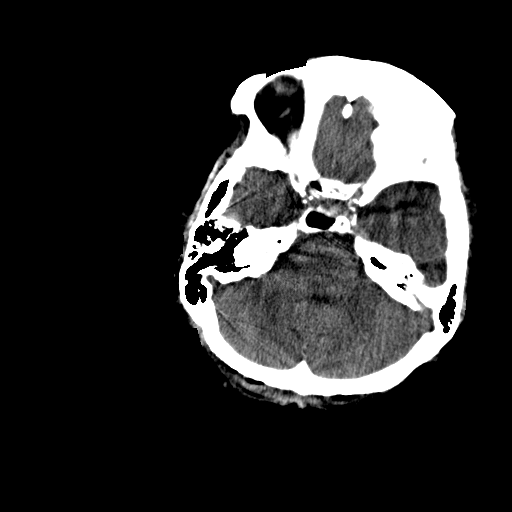
[im 10/34  brain]
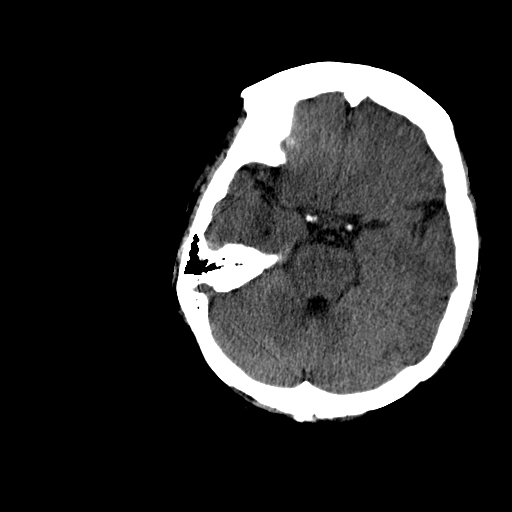
[im 10/34  bone]
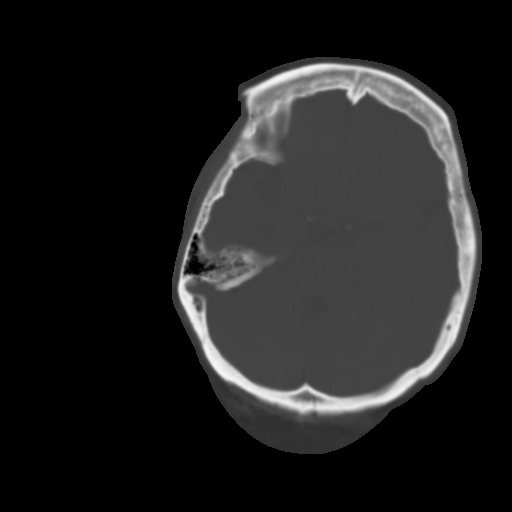
[im 12/34  brain]
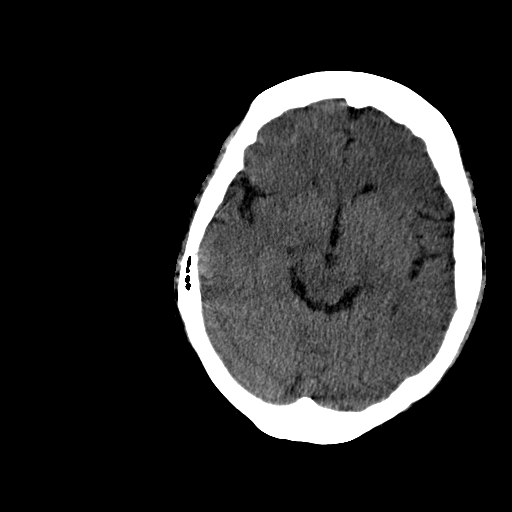
[im 14/34  brain]
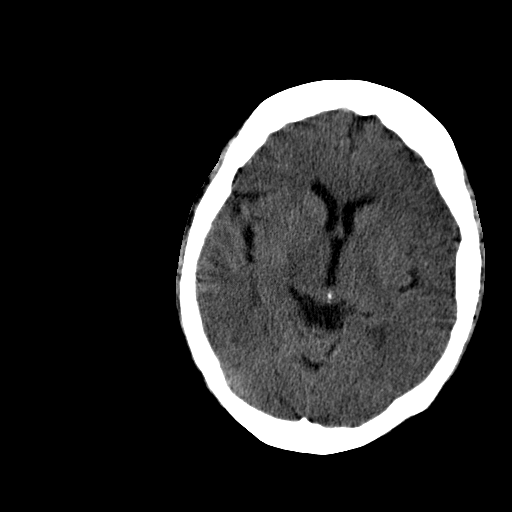
[im 16/34  brain]
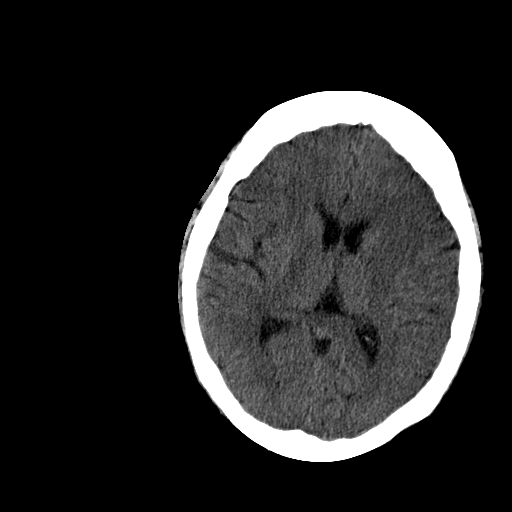
[im 18/34  brain]
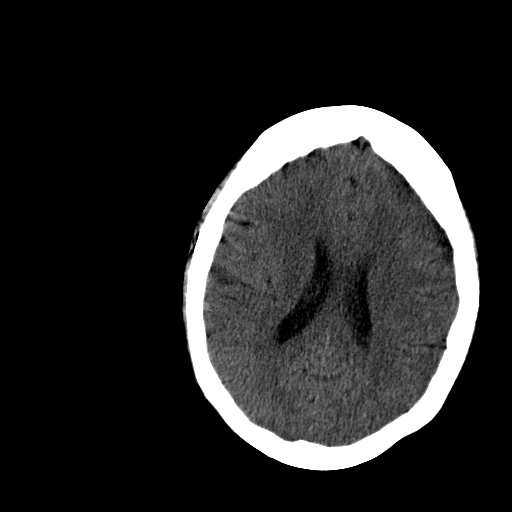
[im 18/34  bone]
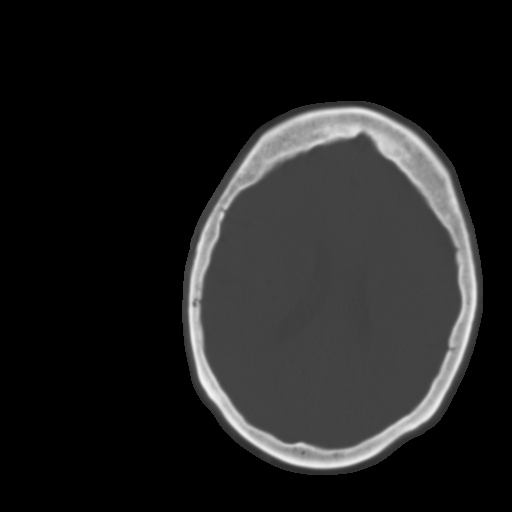
[im 20/34  brain]
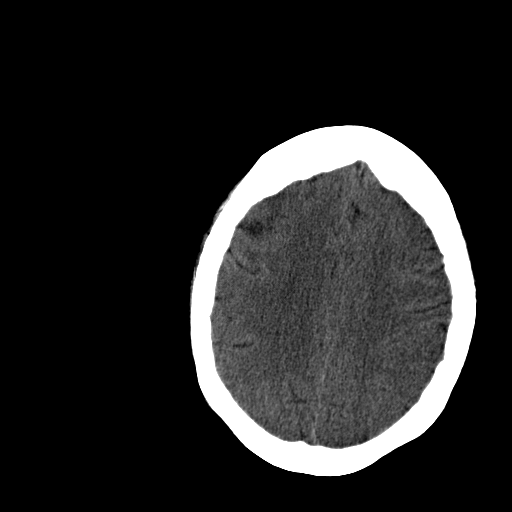
[im 22/34  brain]
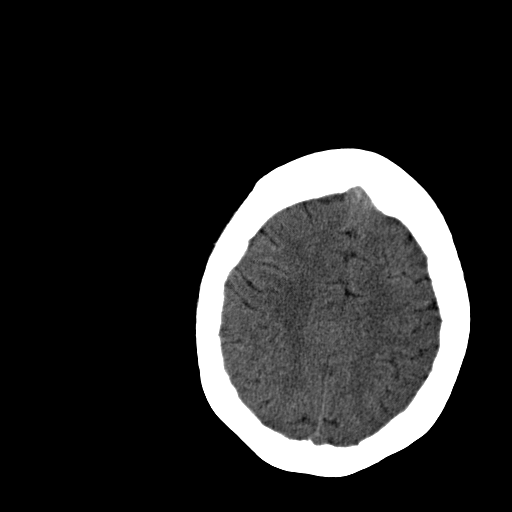
[im 24/34  brain]
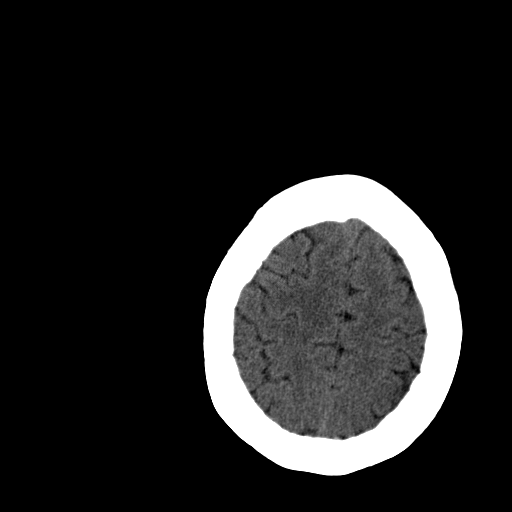
[im 26/34  brain]
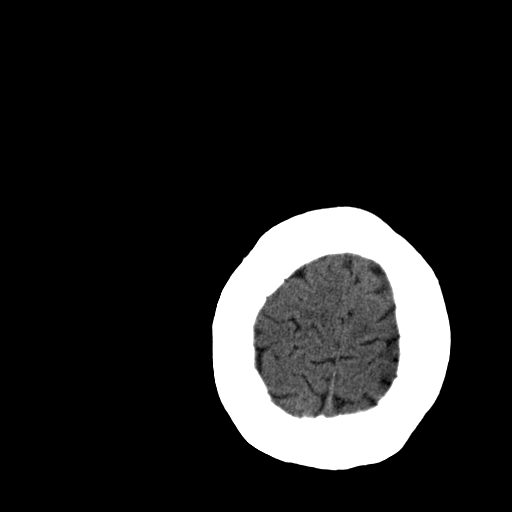
[im 26/34  bone]
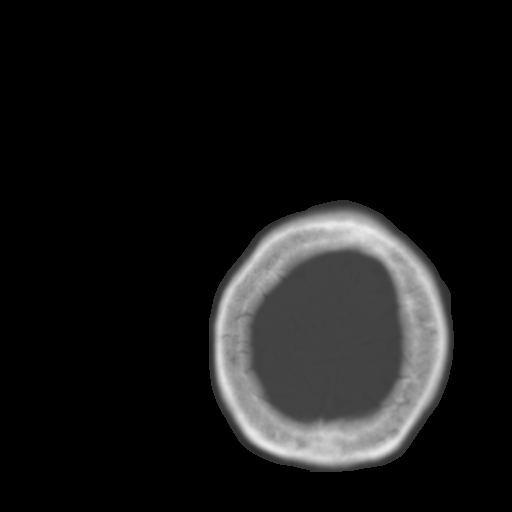
[im 28/34  brain]
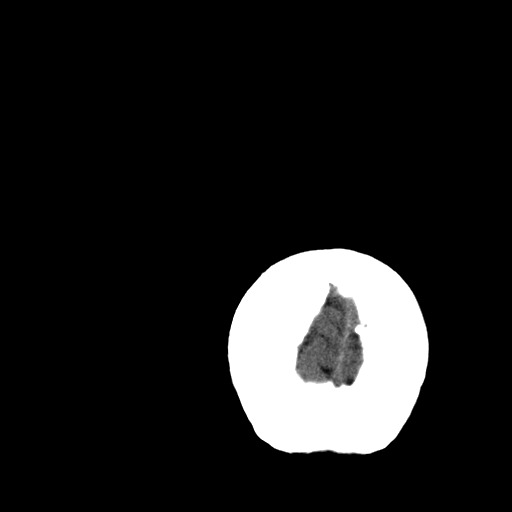
[im 30/34  brain]
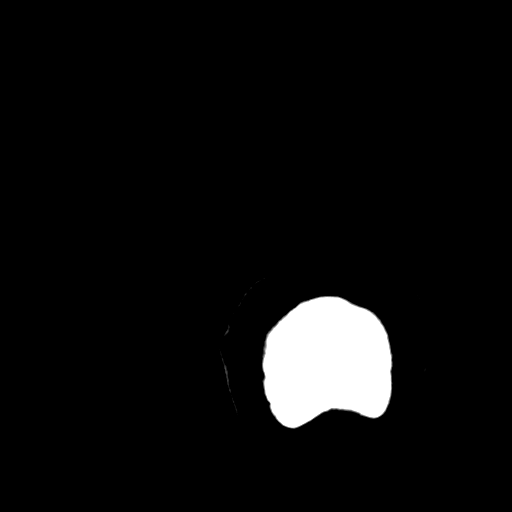
[im 32/34  brain]
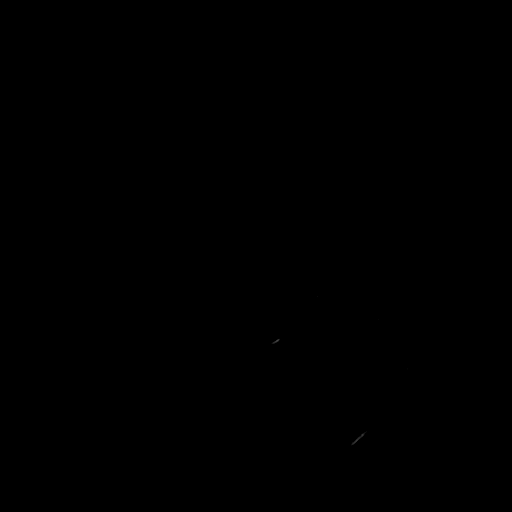

[16 of 30 positions shown; findings below may reference images not displayed]

IMPRESSION: Negative head CT.

## 2006-08-02 ENCOUNTER — Ambulatory Visit: Payer: Self-pay | Admitting: Family Medicine

## 2006-08-03 ENCOUNTER — Encounter: Payer: Self-pay | Admitting: Family Medicine

## 2006-08-31 ENCOUNTER — Ambulatory Visit: Payer: Self-pay | Admitting: Family Medicine

## 2006-09-27 ENCOUNTER — Encounter: Payer: Self-pay | Admitting: Family Medicine

## 2006-09-27 LAB — CONVERTED CEMR LAB
AST: 16 units/L (ref 0–37)
Albumin: 4.1 g/dL (ref 3.5–5.2)
Bilirubin, Direct: 0.1 mg/dL (ref 0.0–0.3)
CO2: 23 meq/L (ref 19–32)
Calcium: 8.8 mg/dL (ref 8.4–10.5)
Chloride: 110 meq/L (ref 96–112)
Glucose, Bld: 95 mg/dL (ref 70–99)
LDL Cholesterol: 174 mg/dL — ABNORMAL HIGH (ref 0–99)
Sodium: 143 meq/L (ref 135–145)
Total Bilirubin: 0.3 mg/dL (ref 0.3–1.2)
Total CHOL/HDL Ratio: 5.3
VLDL: 45 mg/dL — ABNORMAL HIGH (ref 0–40)

## 2006-10-01 ENCOUNTER — Ambulatory Visit: Payer: Self-pay | Admitting: Family Medicine

## 2006-10-02 ENCOUNTER — Ambulatory Visit: Payer: Self-pay | Admitting: Internal Medicine

## 2006-10-04 LAB — CONVERTED CEMR LAB
Basophils Relative: 0.4 % (ref 0.0–1.0)
Eosinophils Absolute: 0.1 10*3/uL (ref 0.0–0.6)
Eosinophils Relative: 2.4 % (ref 0.0–5.0)
HCT: 34 % — ABNORMAL LOW (ref 36.0–46.0)
Hemoglobin: 10.9 g/dL — ABNORMAL LOW (ref 12.0–15.0)
Lymphocytes Relative: 39.7 % (ref 12.0–46.0)
MCV: 69.8 fL — ABNORMAL LOW (ref 78.0–100.0)
Monocytes Absolute: 0.4 10*3/uL (ref 0.2–0.7)
Neutro Abs: 2.9 10*3/uL (ref 1.4–7.7)
Neutrophils Relative %: 50.1 % (ref 43.0–77.0)
WBC: 5.6 10*3/uL (ref 4.5–10.5)

## 2006-10-15 ENCOUNTER — Ambulatory Visit: Payer: Self-pay | Admitting: Family Medicine

## 2006-11-06 ENCOUNTER — Ambulatory Visit: Payer: Self-pay | Admitting: Family Medicine

## 2006-11-13 ENCOUNTER — Ambulatory Visit: Payer: Self-pay | Admitting: Internal Medicine

## 2006-11-13 ENCOUNTER — Encounter: Payer: Self-pay | Admitting: Internal Medicine

## 2006-11-13 DIAGNOSIS — K5730 Diverticulosis of large intestine without perforation or abscess without bleeding: Secondary | ICD-10-CM | POA: Insufficient documentation

## 2006-11-23 ENCOUNTER — Ambulatory Visit: Payer: Self-pay | Admitting: Family Medicine

## 2006-11-27 ENCOUNTER — Encounter: Payer: Self-pay | Admitting: Family Medicine

## 2006-11-27 LAB — CONVERTED CEMR LAB
ALT: 18 units/L (ref 0–35)
Albumin: 4.3 g/dL (ref 3.5–5.2)
Alkaline Phosphatase: 137 units/L — ABNORMAL HIGH (ref 39–117)
BUN: 18 mg/dL (ref 6–23)
Basophils Absolute: 0 10*3/uL (ref 0.0–0.1)
Basophils Relative: 0 % (ref 0–1)
Chloride: 111 meq/L (ref 96–112)
Cholesterol: 239 mg/dL — ABNORMAL HIGH (ref 0–200)
Creatinine, Ser: 0.7 mg/dL (ref 0.40–1.20)
Eosinophils Absolute: 0.1 10*3/uL (ref 0.0–0.7)
HDL: 58 mg/dL (ref >39)
Indirect Bilirubin: 0.3 mg/dL (ref 0.0–0.9)
LDL Cholesterol: 139 mg/dL — ABNORMAL HIGH (ref 0–99)
MCHC: 30.6 g/dL (ref 30.0–36.0)
MCV: 72.7 fL — ABNORMAL LOW (ref 78.0–100.0)
Monocytes Relative: 8 % (ref 3–11)
Neutro Abs: 3 10*3/uL (ref 1.7–7.7)
Neutrophils Relative %: 52 % (ref 43–77)
Potassium: 3.8 meq/L (ref 3.5–5.3)
RDW: 15.9 % — ABNORMAL HIGH (ref 11.5–14.0)
Total Protein: 7.8 g/dL (ref 6.0–8.3)
Triglycerides: 210 mg/dL — ABNORMAL HIGH (ref <150)

## 2006-12-10 ENCOUNTER — Ambulatory Visit: Payer: Self-pay | Admitting: Family Medicine

## 2006-12-10 LAB — CONVERTED CEMR LAB
Amylase: 56 units/L (ref 27–131)
BUN: 29 mg/dL — ABNORMAL HIGH (ref 6–23)
Calcium: 8.7 mg/dL (ref 8.4–10.5)
Creatinine, Ser: 1.07 mg/dL (ref 0.40–1.20)
Lipase: 17 units/L (ref 11–59)

## 2007-01-10 ENCOUNTER — Ambulatory Visit: Payer: Self-pay | Admitting: Family Medicine

## 2007-01-22 ENCOUNTER — Encounter: Admission: RE | Admit: 2007-01-22 | Discharge: 2007-01-22 | Payer: Self-pay | Admitting: Family Medicine

## 2007-01-22 IMAGING — CT CT ABDOMEN W/ CM
1 of 3 series · 14 of 32 positions shown, 19 images · IV contrast (CONTRAST)
Comparison: 09/01/02.

CLINICAL DATA: Diarrhea and abdominal pain. Diarrhea has been going on for 4 months.
 ABDOMEN CT WITH CONTRAST:
TECHNIQUE: Multidetector CT imaging of the abdomen was performed following the standard protocol during bolus administration of intravenous contrast.
 Contrast:  150 cc Omnipaque 300
TECHNIQUE: Multidetector CT imaging of the pelvis was performed following the standard protocol during bolus administration of intravenous contrast.

[Series 9635: — · axial · 0.66mm/px · z∈[+1326,+1721]mm · 14 of 91 slices shown, 19 images]
[im 6/91  soft-tissue]
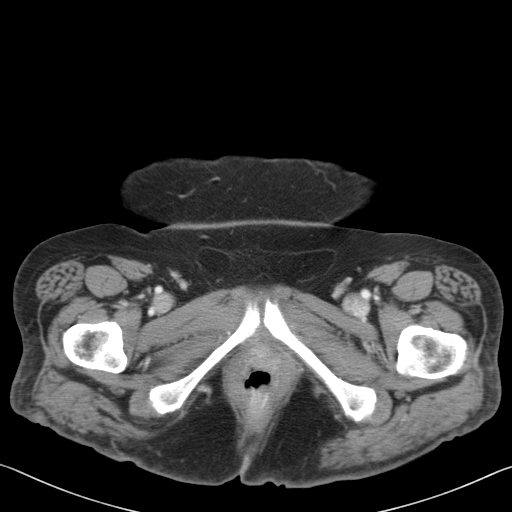
[im 6/91  bone]
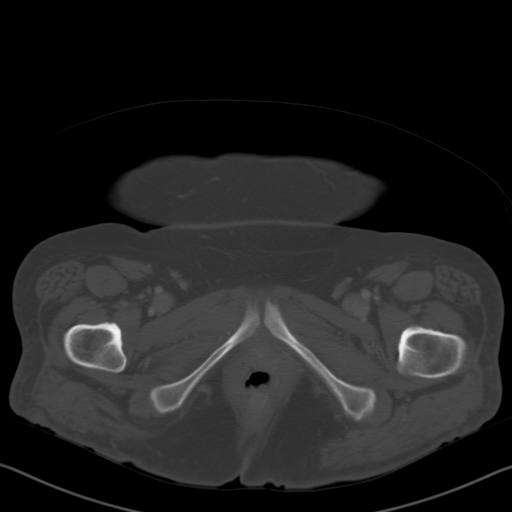
[im 12/91  soft-tissue]
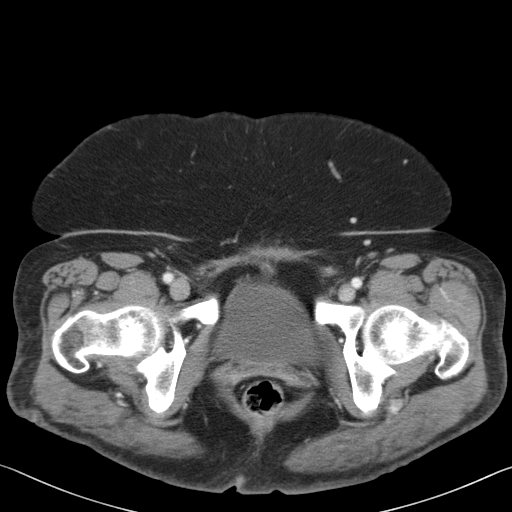
[im 17/91  soft-tissue]
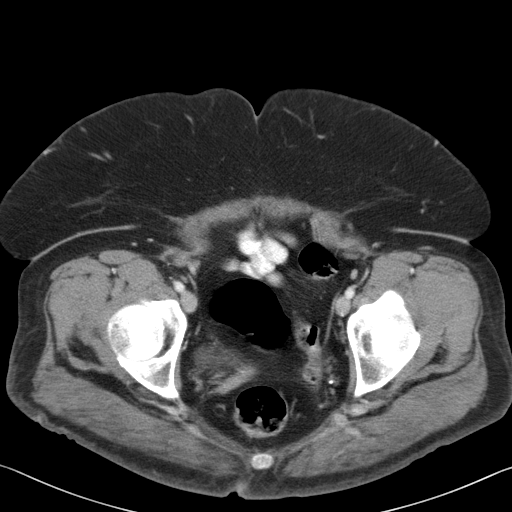
[im 29/91  soft-tissue]
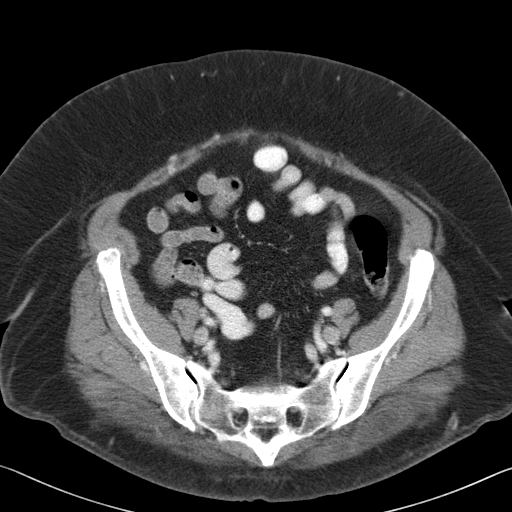
[im 34/91  soft-tissue]
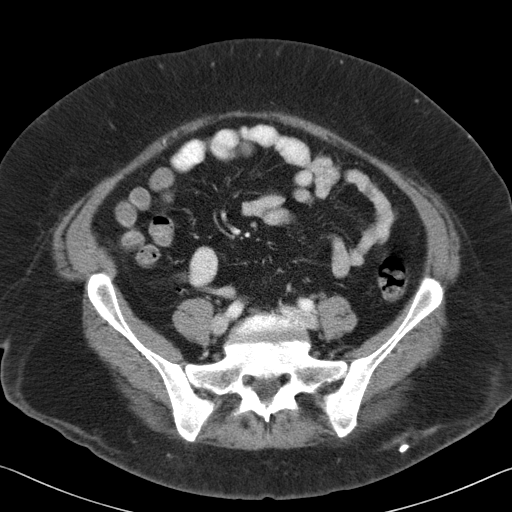
[im 40/91  soft-tissue]
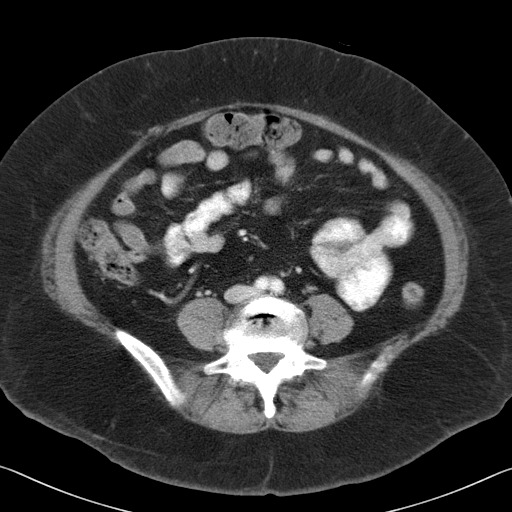
[im 46/91  soft-tissue]
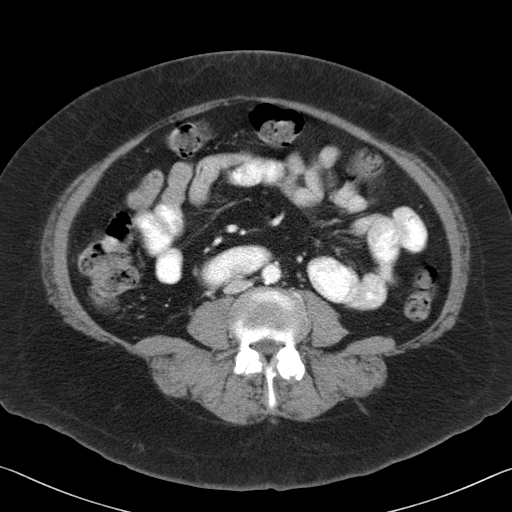
[im 51/91  soft-tissue]
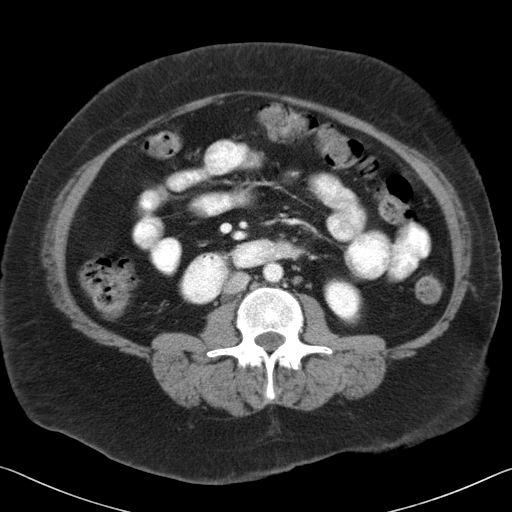
[im 57/91  soft-tissue]
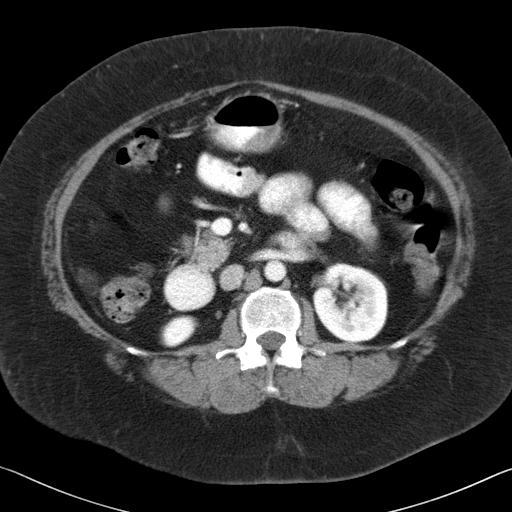
[im 57/91  bone]
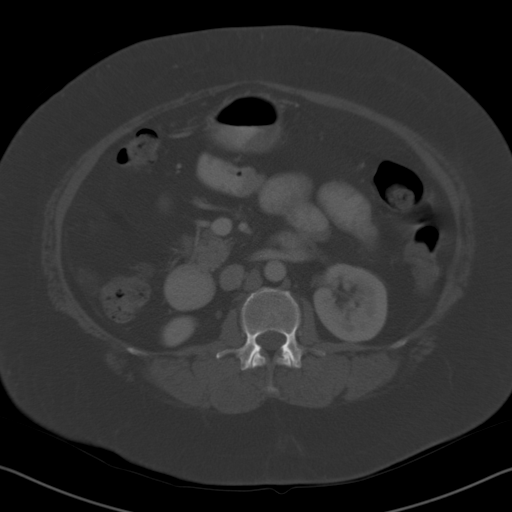
[im 62/91  soft-tissue]
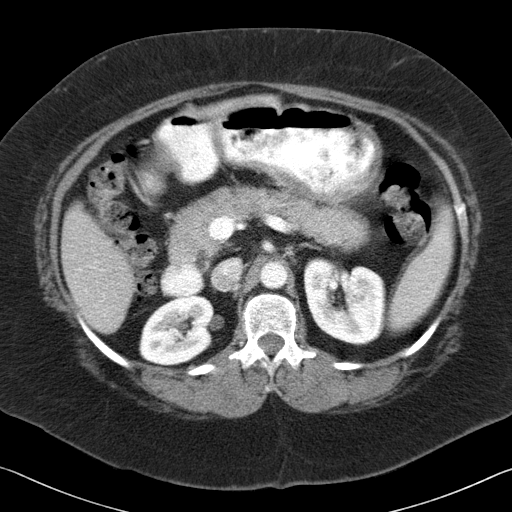
[im 68/91  lung]
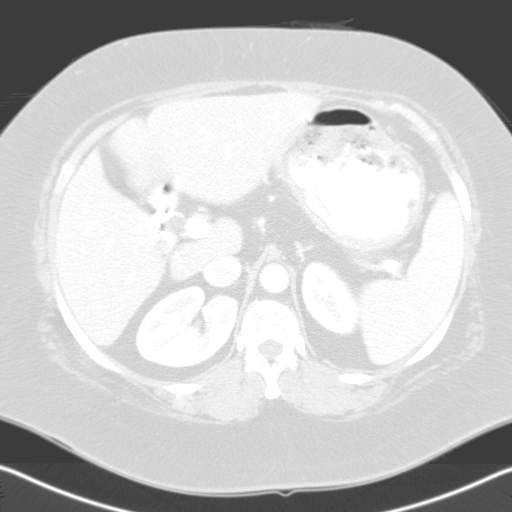
[im 74/91  soft-tissue]
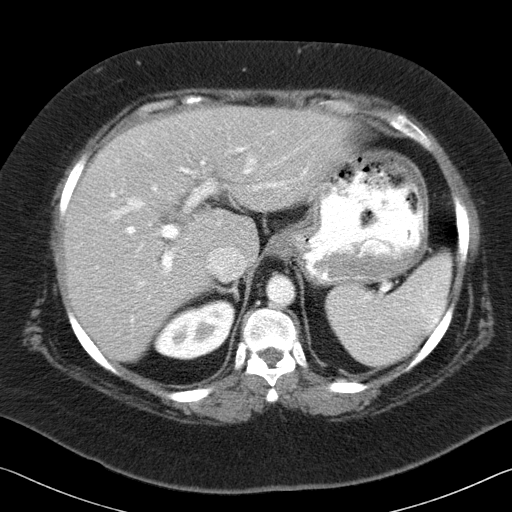
[im 74/91  lung]
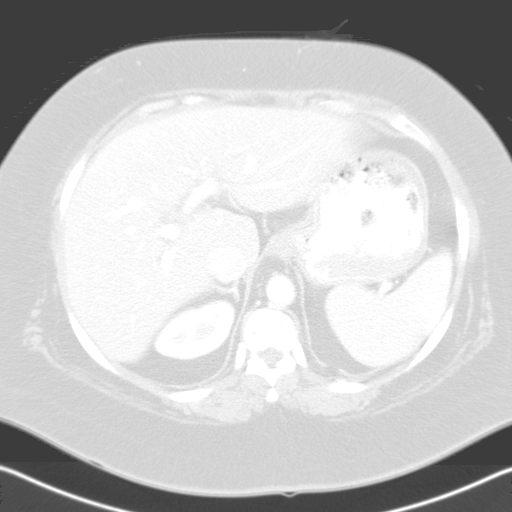
[im 79/91  soft-tissue]
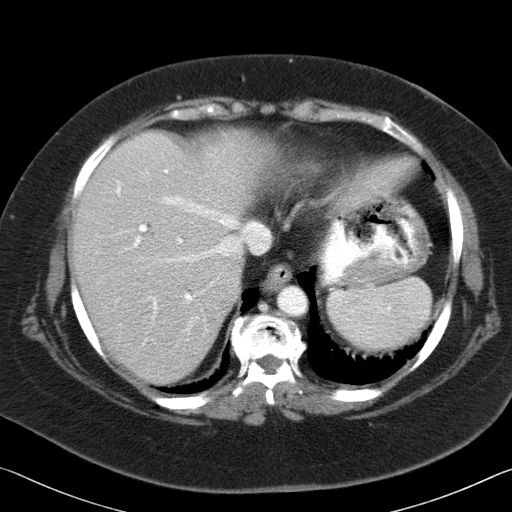
[im 79/91  lung]
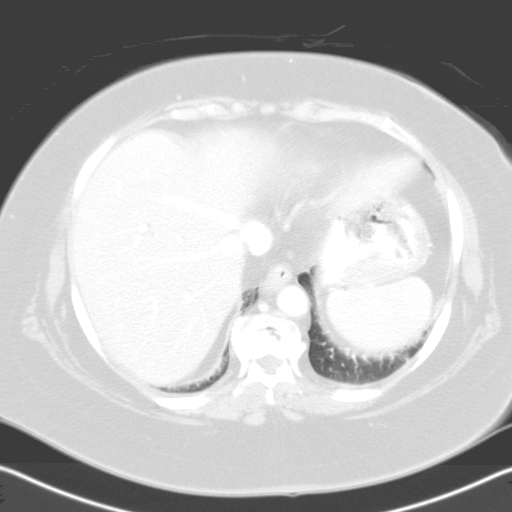
[im 85/91  soft-tissue]
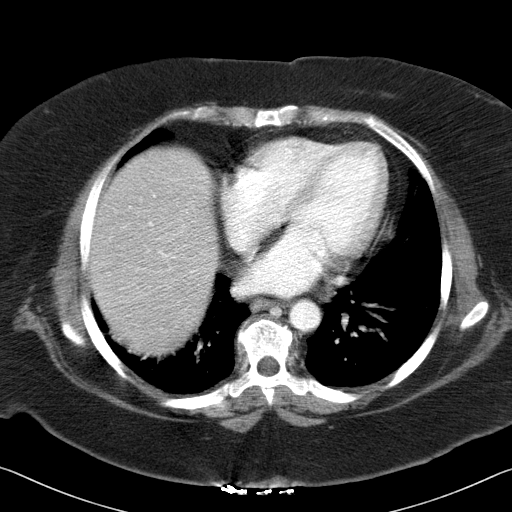
[im 85/91  lung]
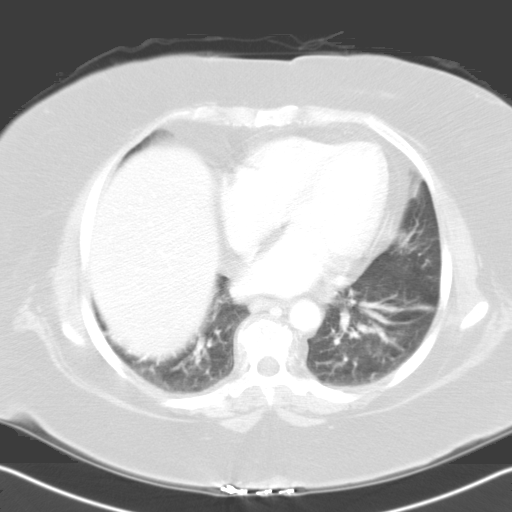

[14 of 32 positions shown; findings below may reference images not displayed]

FINDINGS: There is no pleural or pericardial effusions.  There is subsegmental atelectasis at both lung bases.  
 The liver is normal in attenuation and morphology.
 The spleen is negative.
 The adrenal glands are negative.
 The pancreas is normal.  The patient is status post cholecystectomy.
 There are no pathologically enlarged retroperitoneal lymph nodes. No mesenteric lymphadenopathy is noted.  The visualized bowel loops are all normal. There is retained stool seen throughout the colon.  No abnormal fluid collections are identified within the abdomen.  
 Review of the bone windows shows no lytic or sclerotic lesions.
IMPRESSION: 1.  No acute abdominal CT findings.
 2.  Status post cholecystectomy.
 3.  Mild subsegmental atelectasis and dependent changes at the lung bases.
 PELVIS CT WITH CONTRAST:
FINDINGS: There is no free pelvic fluid.  The urinary bladder is normal.  The pelvic bowel loops are unremarkable.  There are no pathologically enlarged pelvic lymph nodes.  
 Review of the bone windows shows no lytic or sclerotic lesions.
IMPRESSION: No acute pelvic CT findings.

## 2007-01-29 ENCOUNTER — Encounter: Payer: Self-pay | Admitting: Family Medicine

## 2007-01-29 LAB — CONVERTED CEMR LAB
ALT: 24 units/L (ref 0–35)
AST: 22 units/L (ref 0–37)
Albumin: 4.1 g/dL (ref 3.5–5.2)
Alkaline Phosphatase: 152 units/L — ABNORMAL HIGH (ref 39–117)
Calcium: 9 mg/dL (ref 8.4–10.5)
Cholesterol: 177 mg/dL (ref 0–200)
Creatinine, Ser: 0.77 mg/dL (ref 0.40–1.20)
HDL: 54 mg/dL (ref >39)
Sodium: 147 meq/L — ABNORMAL HIGH (ref 135–145)
Total Bilirubin: 0.3 mg/dL (ref 0.3–1.2)
Total CHOL/HDL Ratio: 3.3
Total Protein: 7.3 g/dL (ref 6.0–8.3)
Triglycerides: 155 mg/dL — ABNORMAL HIGH (ref <150)

## 2007-01-29 IMAGING — CT CT HEAD W/O CM
1 series · 15 of 30 positions shown, 19 images · IV contrast (agent unspecified)
Comparison: 12/27/2004.

CLINICAL DATA: Dizziness with generalized weakness and aching.  
HEAD CT WITHOUT CONTRAST:
TECHNIQUE: Contiguous axial images were obtained from the base of the skull through the vertex according to standard protocol without contrast.

[Series 316: — · axial · 0.49mm/px · z∈[-637,-497]mm · 15 of 32 slices shown, 19 images]
[im 2/32  brain]
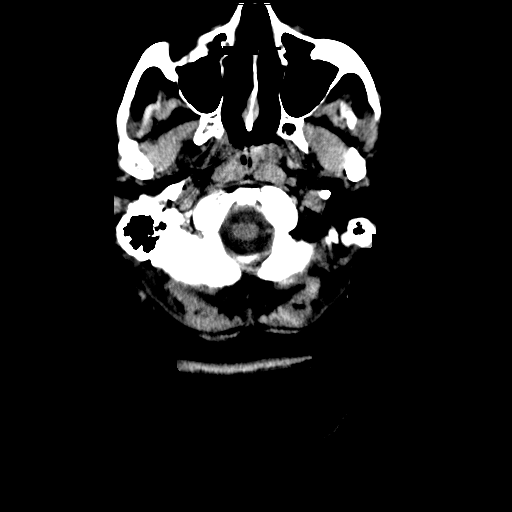
[im 2/32  bone]
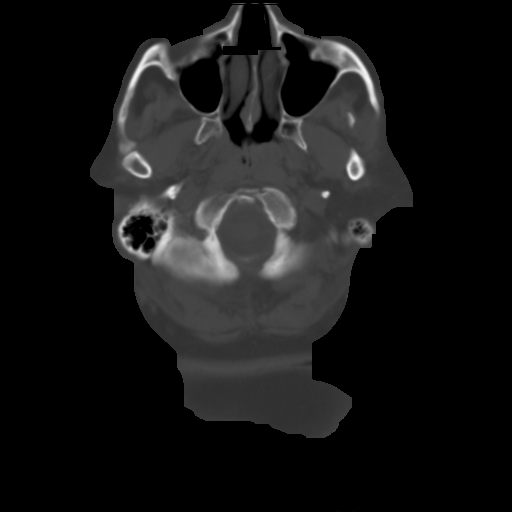
[im 4/32  brain]
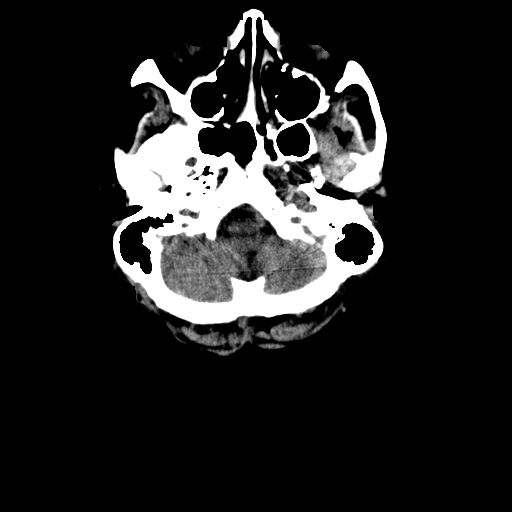
[im 6/32  brain]
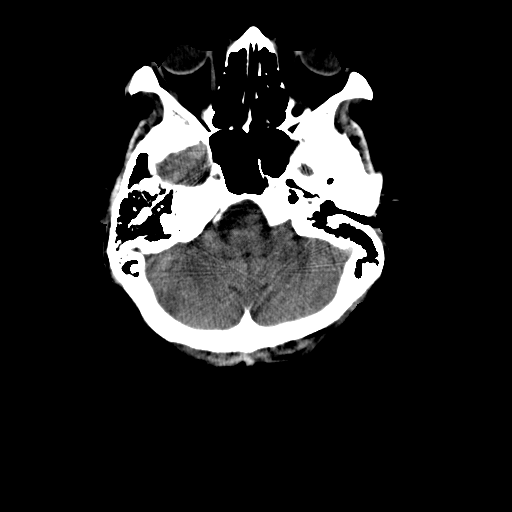
[im 8/32  brain]
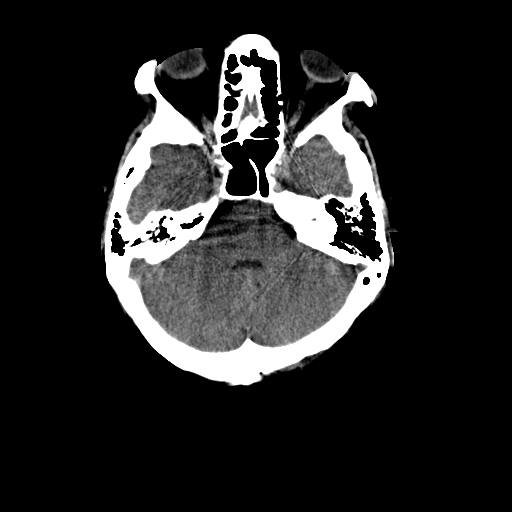
[im 10/32  brain]
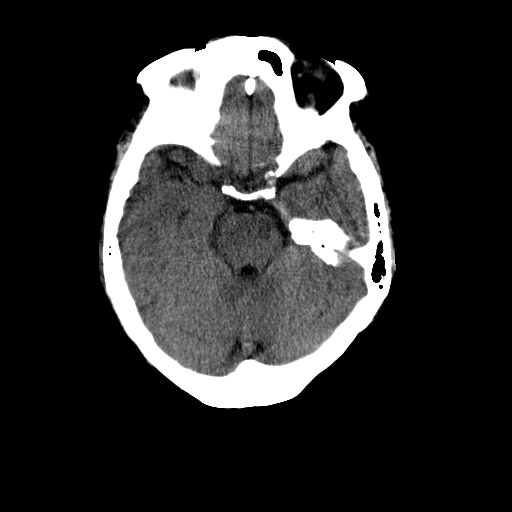
[im 10/32  bone]
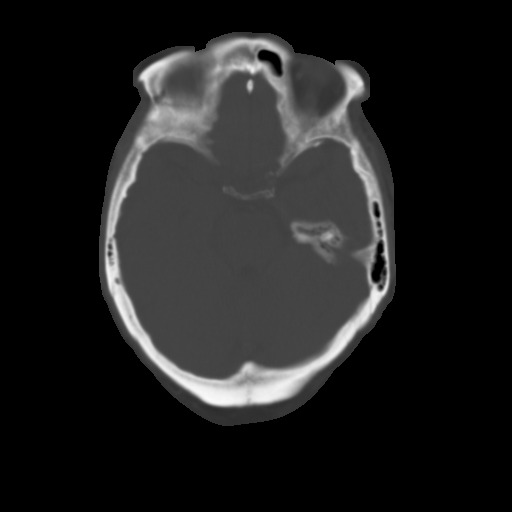
[im 12/32  brain]
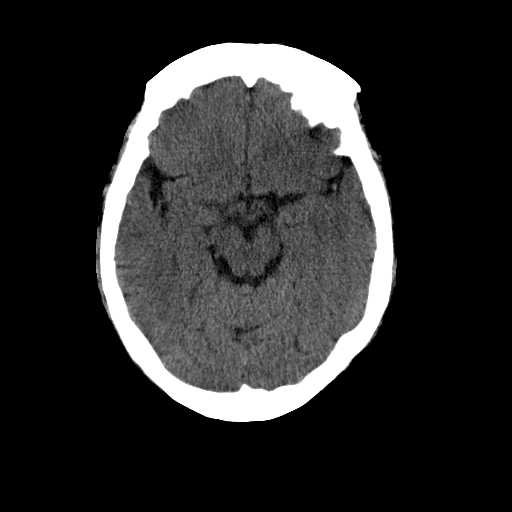
[im 14/32  brain]
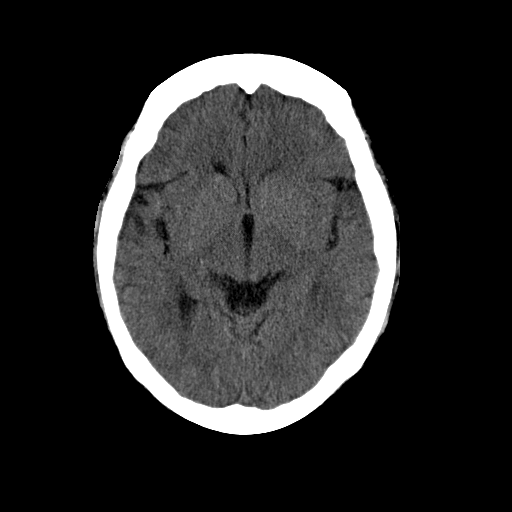
[im 17/32  brain]
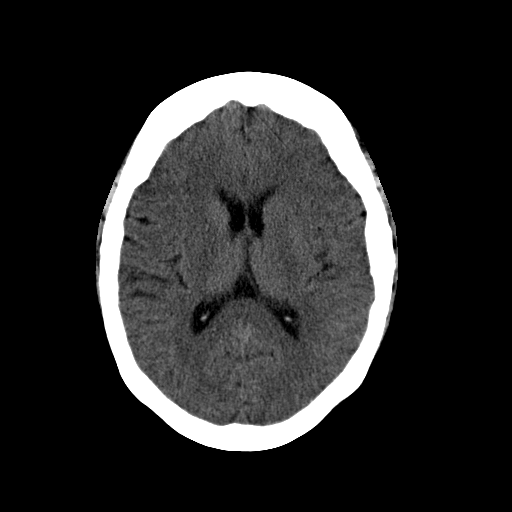
[im 18/32  brain]
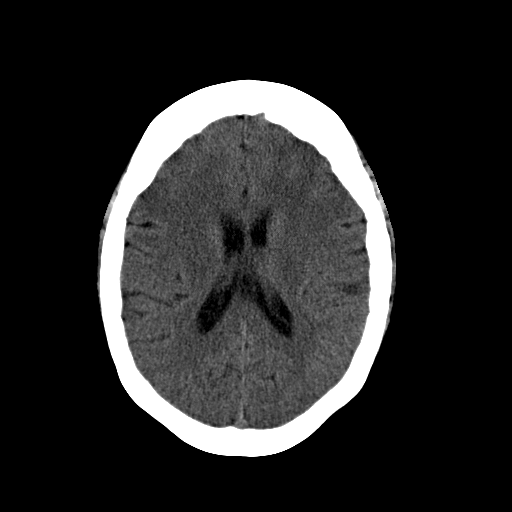
[im 18/32  bone]
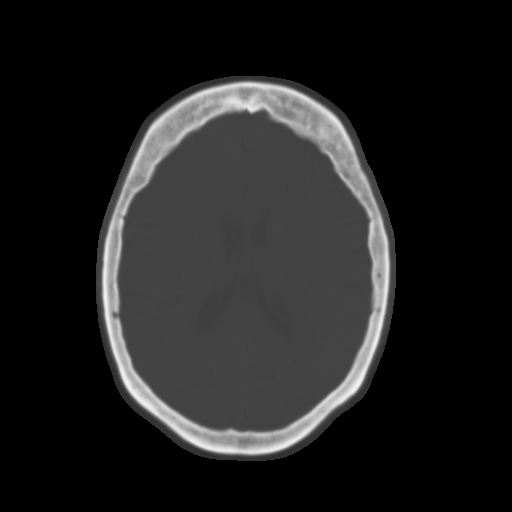
[im 20/32  brain]
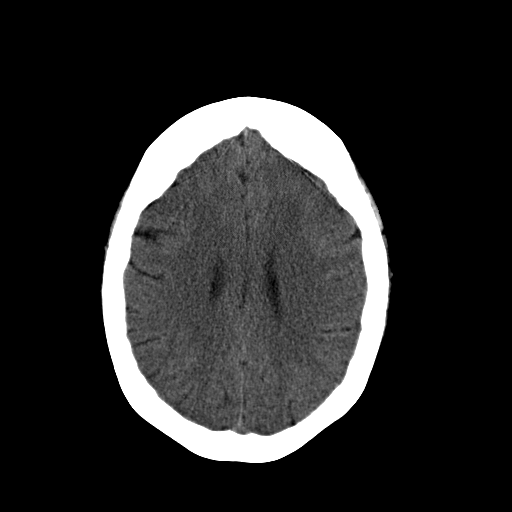
[im 22/32  brain]
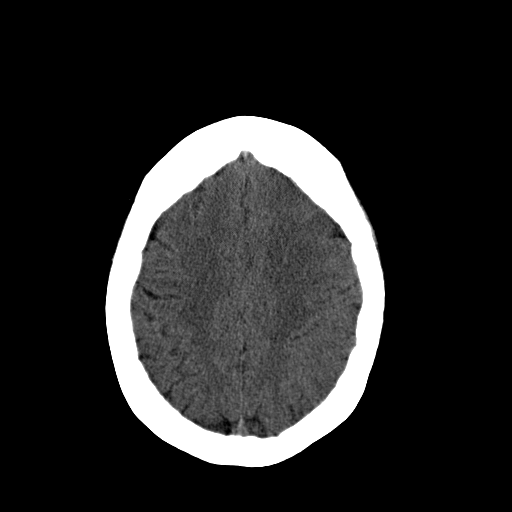
[im 24/32  brain]
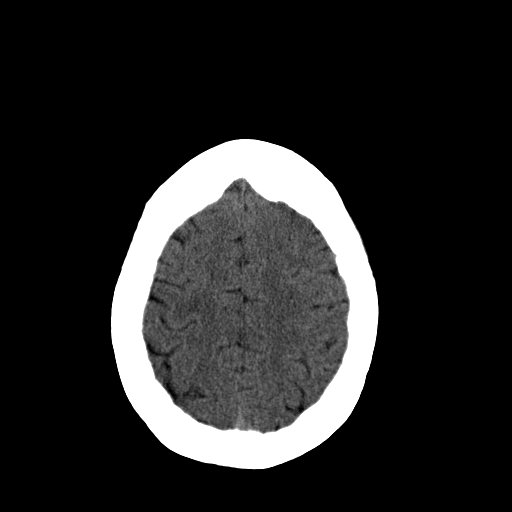
[im 26/32  brain]
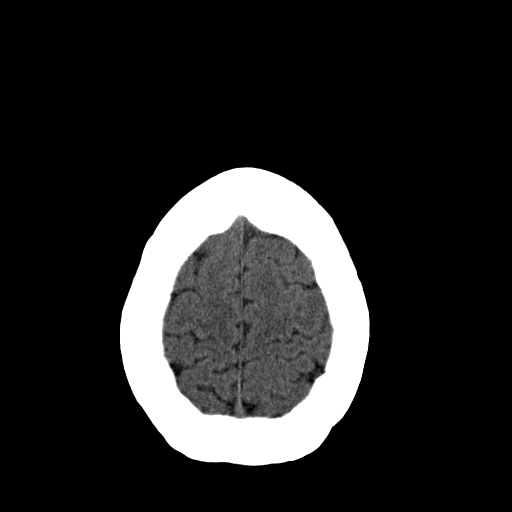
[im 26/32  bone]
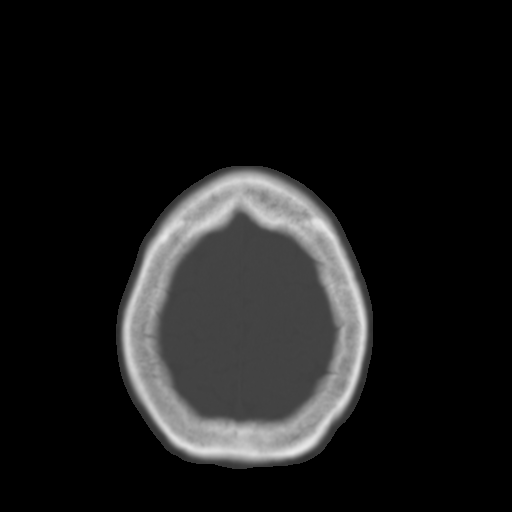
[im 28/32  brain]
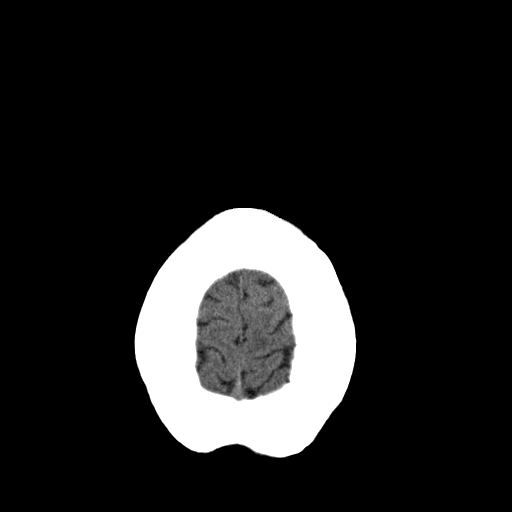
[im 30/32  brain]
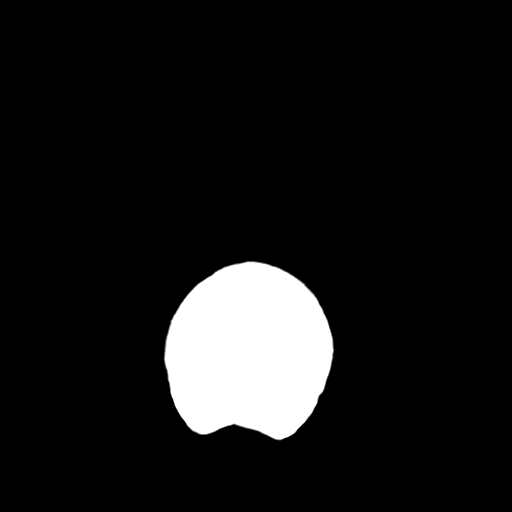

[15 of 30 positions shown; findings below may reference images not displayed]

FINDINGS: There is prominent streak artifact in the posterior fossa, limiting evaluation of the brainstem.  High density centrally in the pons on images #8 and #[DATE] be artifactual.  There is additional low density in the left aspect of the pons on image #10.  No supratentorial abnormalities are seen.  There is no extra-axial fluid collection, hydrocephalus or midline shift.  The visualized paranasal sinuses are clear.  Calvarium is intact with hyperostosis frontalis.
IMPRESSION: 1. Limited evaluation of the brainstem due to streak artifact.  It is difficult to exclude a pontine stroke asymmetric to the left.  High density centrally is probably artifactual, and no definite acute hemorrhage is seen.
2. No acute supratentorial abnormalities are seen.
3. If further evaluation of possible pontine stroke is warranted clinically, MRI could be performed for further assessment.  These findings were reviewed with Dr. Hinderiks shortly after performance of the scan.

## 2007-02-12 ENCOUNTER — Ambulatory Visit: Payer: Self-pay | Admitting: Family Medicine

## 2007-03-05 ENCOUNTER — Ambulatory Visit: Payer: Self-pay | Admitting: Family Medicine

## 2007-03-12 ENCOUNTER — Ambulatory Visit: Payer: Self-pay | Admitting: Family Medicine

## 2007-03-14 ENCOUNTER — Encounter: Payer: Self-pay | Admitting: Family Medicine

## 2007-03-14 HISTORY — PX: BIOPSY THYROID: PRO38

## 2007-03-15 ENCOUNTER — Ambulatory Visit: Payer: Self-pay | Admitting: Family Medicine

## 2007-03-16 ENCOUNTER — Encounter: Payer: Self-pay | Admitting: Family Medicine

## 2007-04-11 ENCOUNTER — Ambulatory Visit (HOSPITAL_COMMUNITY): Admission: RE | Admit: 2007-04-11 | Discharge: 2007-04-11 | Payer: Self-pay | Admitting: Ophthalmology

## 2007-04-12 ENCOUNTER — Ambulatory Visit: Payer: Self-pay | Admitting: Family Medicine

## 2007-04-15 ENCOUNTER — Ambulatory Visit: Payer: Self-pay | Admitting: Family Medicine

## 2007-04-18 ENCOUNTER — Ambulatory Visit: Payer: Self-pay | Admitting: Orthopedic Surgery

## 2007-04-18 DIAGNOSIS — M7150 Other bursitis, not elsewhere classified, unspecified site: Secondary | ICD-10-CM

## 2007-04-24 IMAGING — CT CT HEAD W/O CM
1 series · 16 of 28 positions shown, 20 images · non-contrast
Comparison: none

HISTORY: Dizziness, left-sided weakness

[Series 7856: — · axial · 0.45mm/px · z∈[-681,-556]mm · 16 of 28 slices shown, 20 images]
[im 2/28  brain]
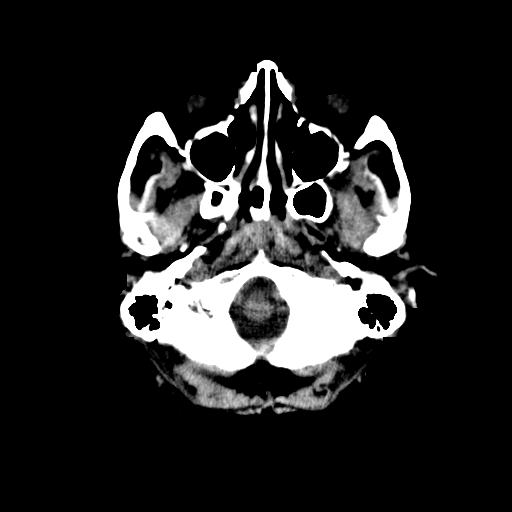
[im 2/28  bone]
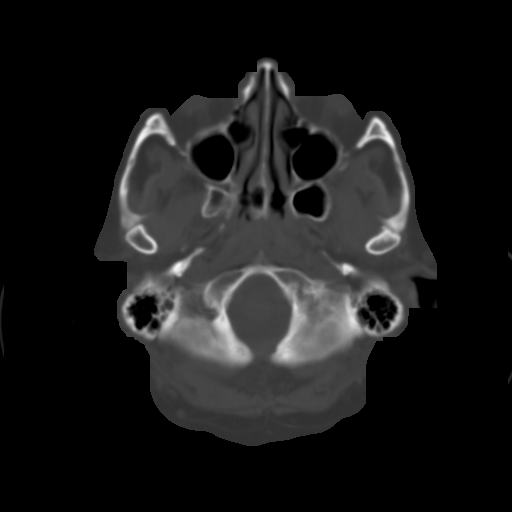
[im 4/28  brain]
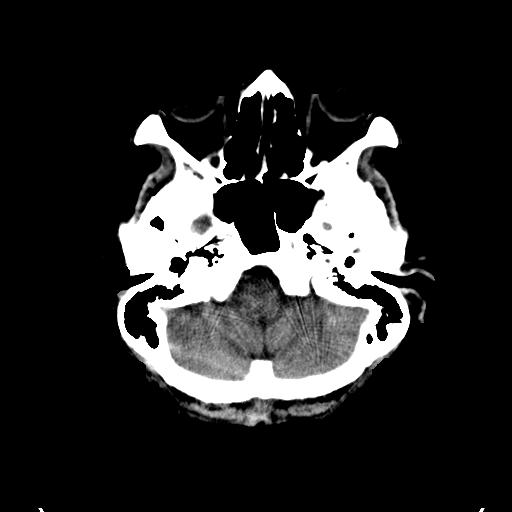
[im 6/28  brain]
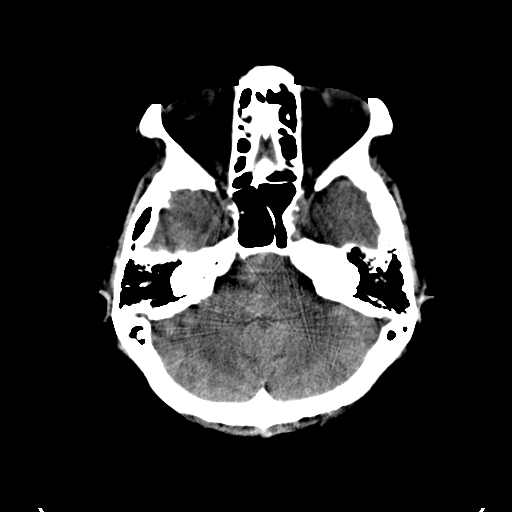
[im 7/28  brain]
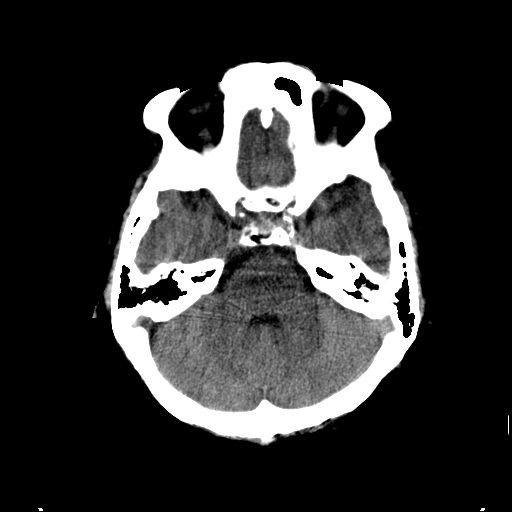
[im 9/28  brain]
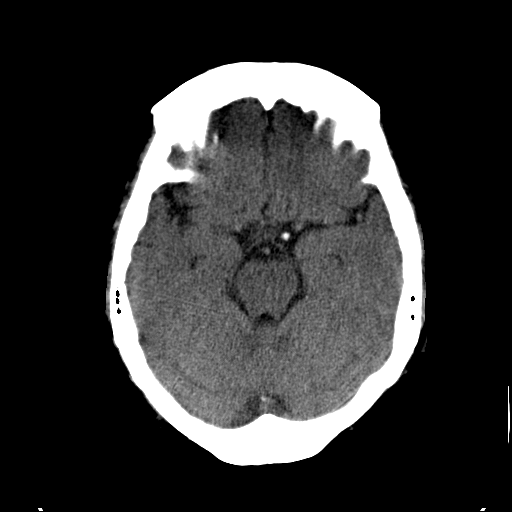
[im 9/28  bone]
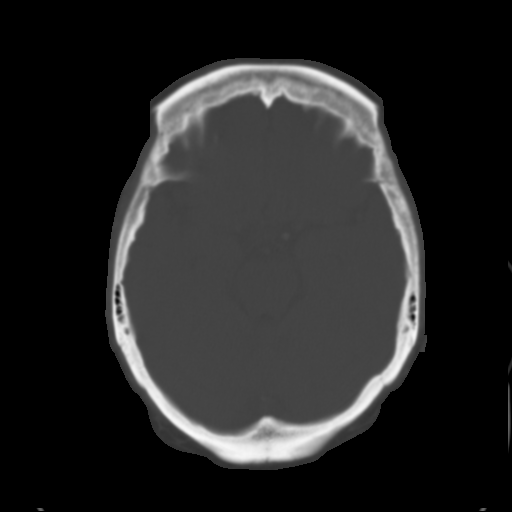
[im 10/28  brain]
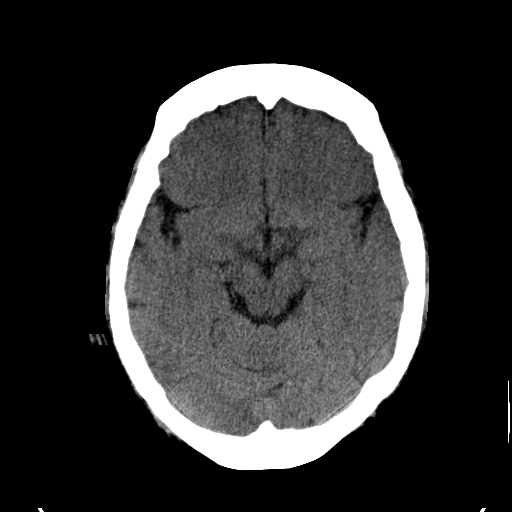
[im 12/28  brain]
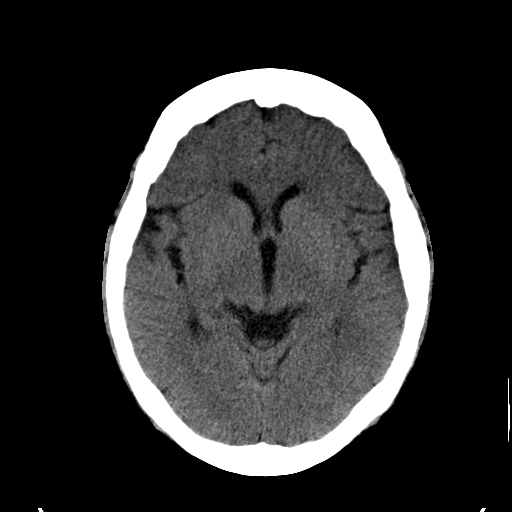
[im 14/28  brain]
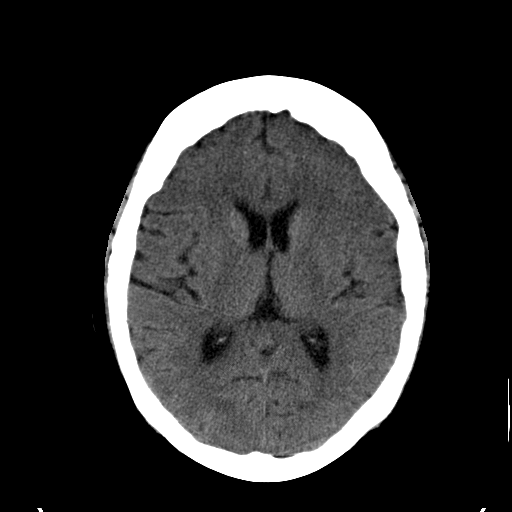
[im 15/28  brain]
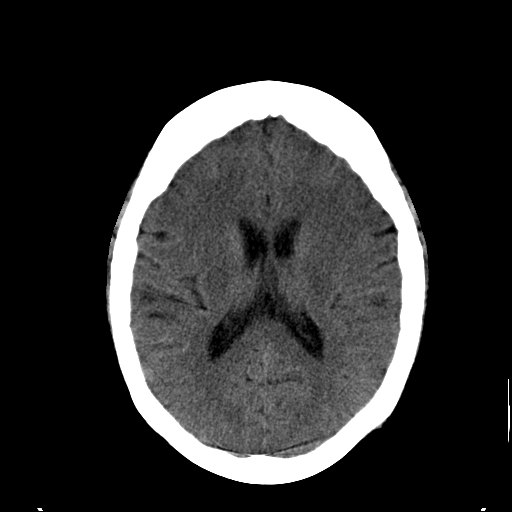
[im 15/28  bone]
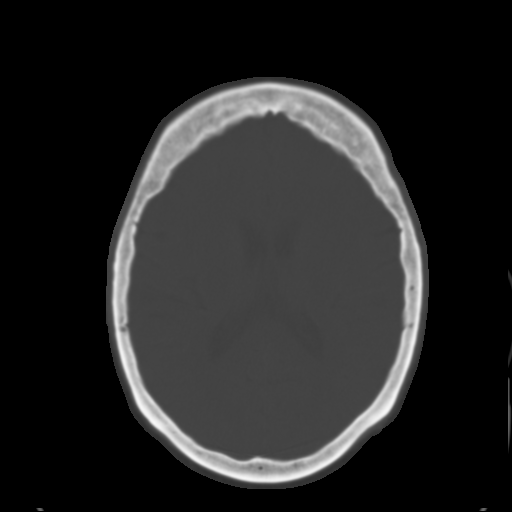
[im 17/28  brain]
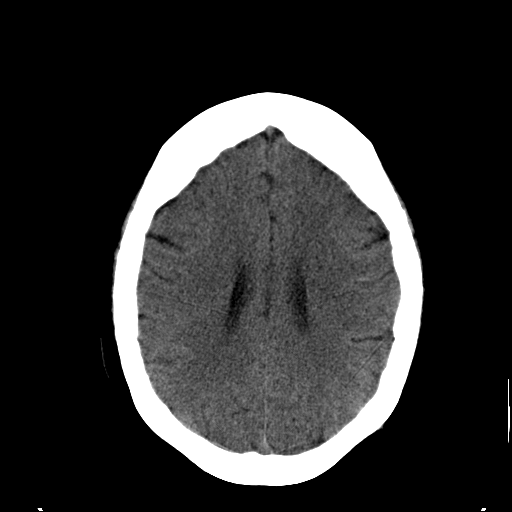
[im 19/28  brain]
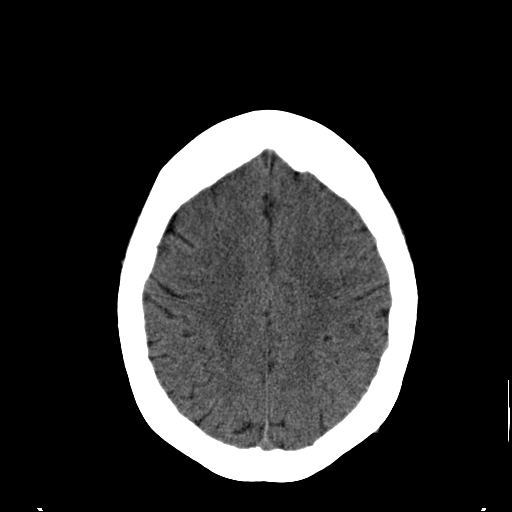
[im 20/28  brain]
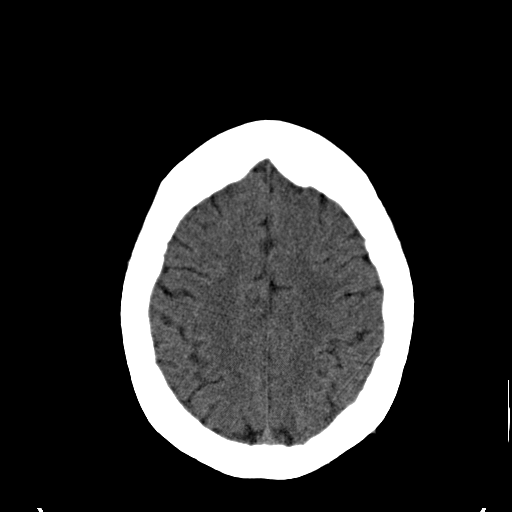
[im 22/28  brain]
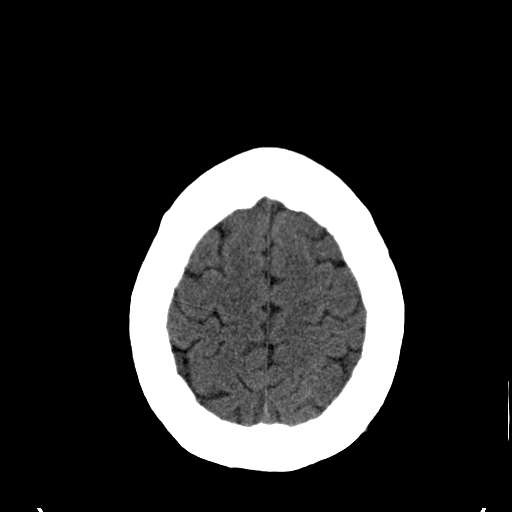
[im 22/28  bone]
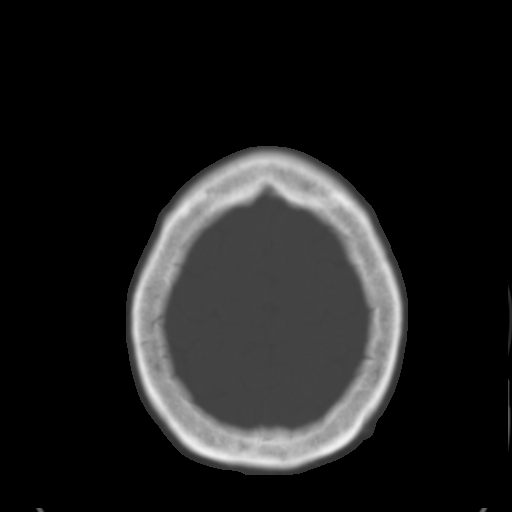
[im 23/28  brain]
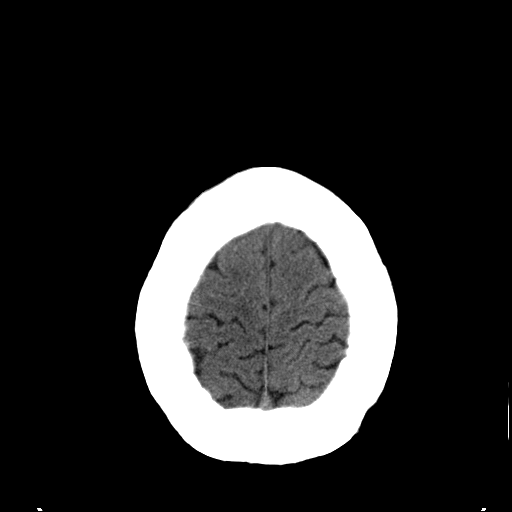
[im 25/28  brain]
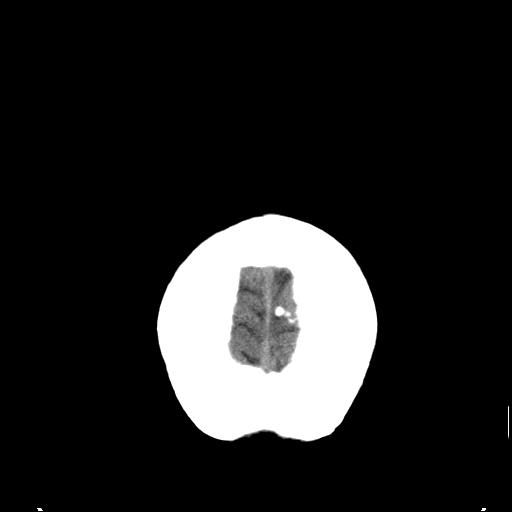
[im 27/28  brain]
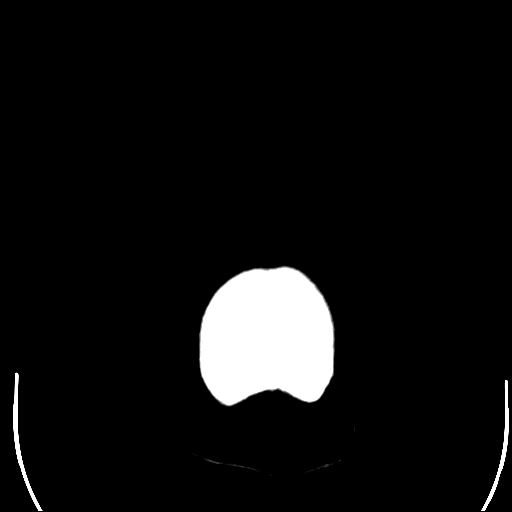

[16 of 28 positions shown; findings below may reference images not displayed]

CT HEAD WITHOUT CONTRAST:

Routine noncontrast CT without priors for comparison.

Normal ventricular morphology.
No midline shift or mass-effect.
Beam hardening artifacts obscure pons and brainstem as well as base of posterior
fossa.
No acute infarct, intracranial hemorrhage, or mass.
Sinuses clear and bones unremarkable.
IMPRESSION: No acute intracranial abnormalities.

## 2007-04-25 ENCOUNTER — Ambulatory Visit (HOSPITAL_COMMUNITY): Admission: RE | Admit: 2007-04-25 | Discharge: 2007-04-25 | Payer: Self-pay | Admitting: Family Medicine

## 2007-04-25 IMAGING — MR MR HEAD W/O CM
7 of 9 series · 29 of 48 positions shown · IV contrast (agent unspecified)
Comparison: 07/06/2005, 12/21/2003 and 08/10/2003.

CLINICAL DATA: Altered mental status.  Falls.  Loss of balance for the past day.  Demented.  Question TIA?
MRI BRAIN WITHOUT CONTRAST:
TECHNIQUE: Multiplanar and multiecho pulse sequences of the brain and surrounding structures were obtained according to standard protocol without IV contrast.
TECHNIQUE: 3-D time of flight pulse sequence was performed to examine the cerebral vasculature, centered at the circle of Willis, without IV contrast.  Multiplanar MR image reconstructions were generated to evaluate the vascular anatomy.
Left vertebral artery is dominant.  Minimal narrowing and irregularity distal right vertebral artery.  The posterior-inferior cerebellar artery (PICA) is not visualized on either side.  Tortuosity basilar artery with narrowing mid-aspect.  
Fetal-type origin of posterior cerebral arteries bilaterally.  Aplastic A1 segment of the right anterior cerebral artery.  
Moderate focal stenosis of the right internal carotid artery distal vertical segment just proximal to the petrous segment.  Areas of signal loss along the cavernous segment of the right internal carotid artery.  Stenosis in this region cannot be excluded.  No aneurysm detected.  An aneurysm 5 mm or smaller may not be visualized by the present technique.  If were necessary to exclude an aneurysm of this size, vasculitis or to grade possibility of internal cranial stenosis, a formal catheter angiogram would be necessary.

[Series 2: T1 · sagittal · 5.0mm · 0.47mm/px · 2 of 12 slices shown (1 of 2)]
[im 1/12]
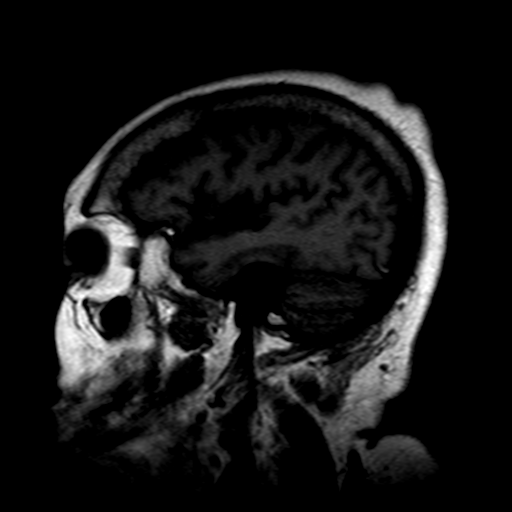
[im 12/12]
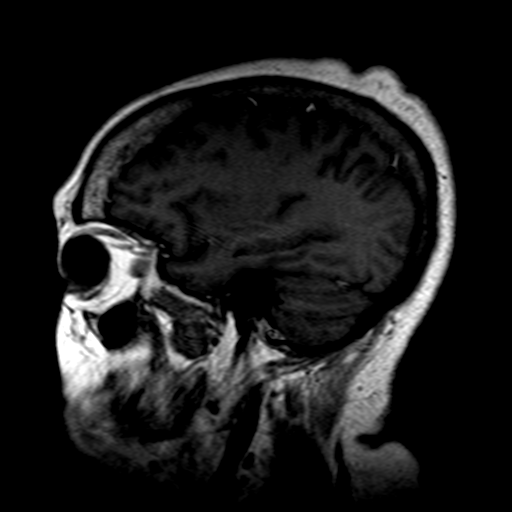

[Series 3: DWI · axial · 5.0mm · 0.86mm/px · z∈[-34,+98]mm · 3 of 25 slices shown (1 of 2)]
[im 1/25]
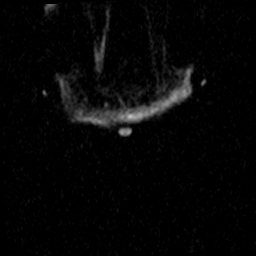
[im 13/25]
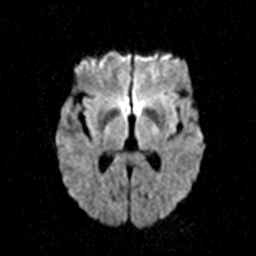
[im 25/25]
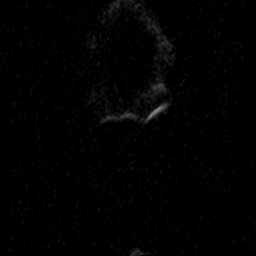

[Series 4: DWI · axial · 5.0mm · 0.86mm/px · z∈[-34,+98]mm · 3 of 25 slices shown (2 of 2)]
[im 1/25]
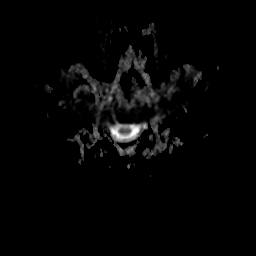
[im 13/25]
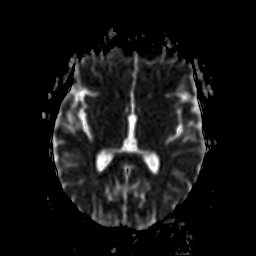
[im 25/25]
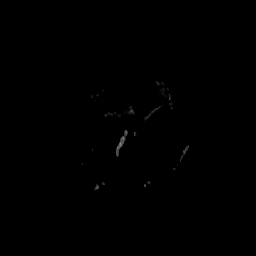

[Series 5: MRA · axial · 0.8mm · 0.34mm/px · z∈[-37,+38]mm · 7 of 118 slices shown]
[im 8/118]
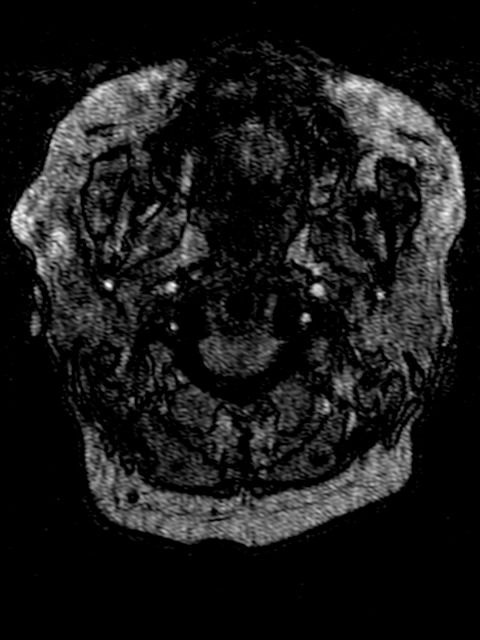
[im 24/118]
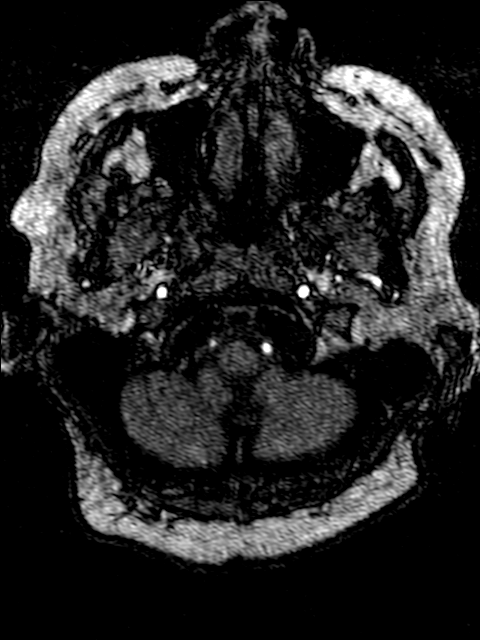
[im 40/118]
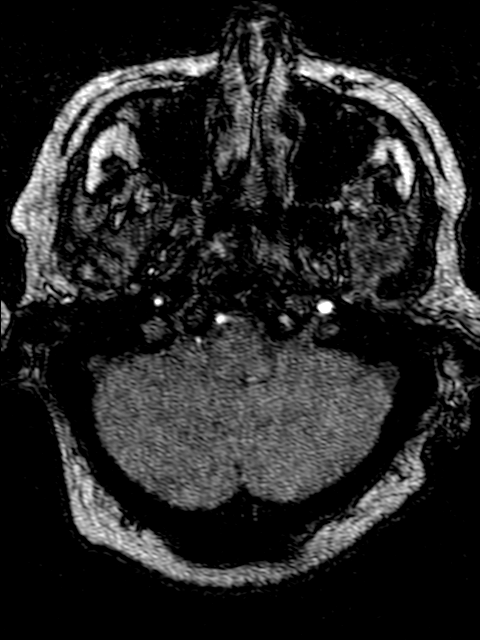
[im 55/118]
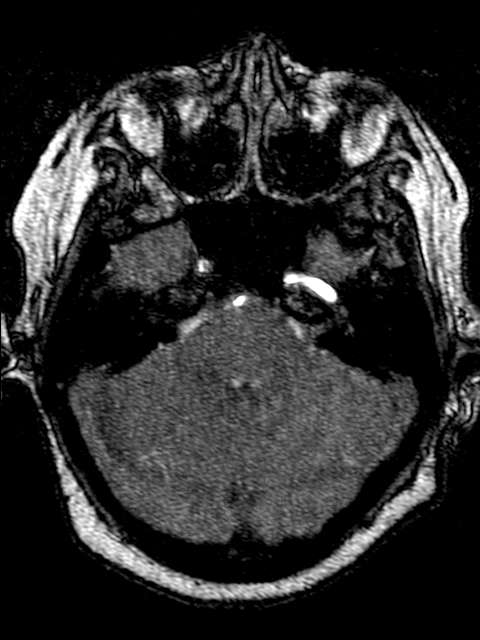
[im 71/118]
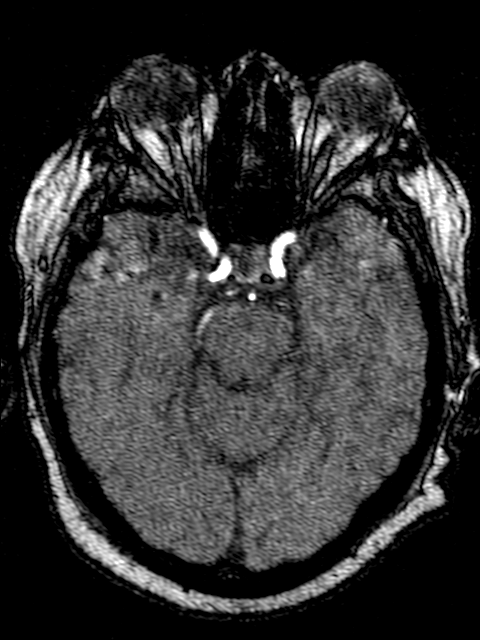
[im 86/118]
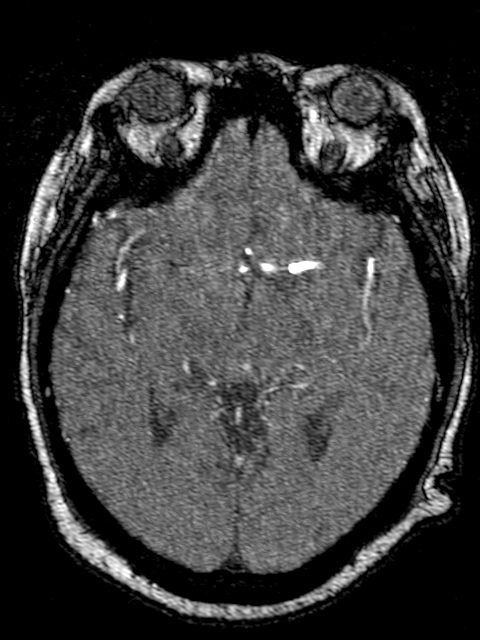
[im 102/118]
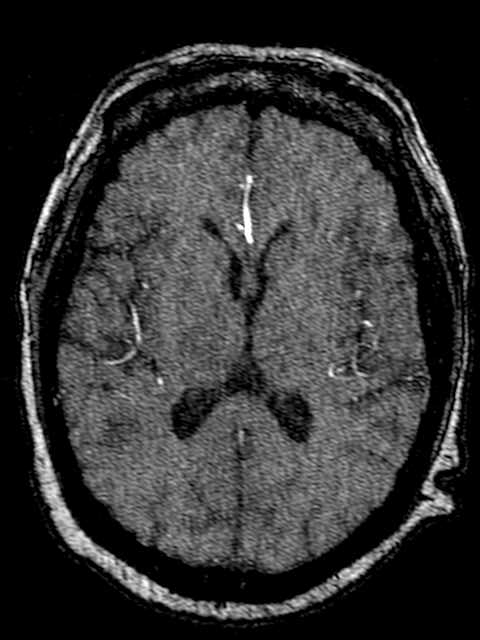

[Series 9: T2 · axial · 5.0mm · 0.62mm/px · z∈[-40,+98]mm · 3 of 24 slices shown]
[im 1/24]
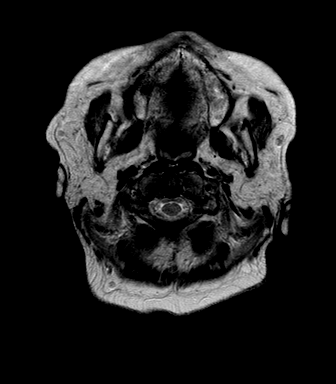
[im 12/24]
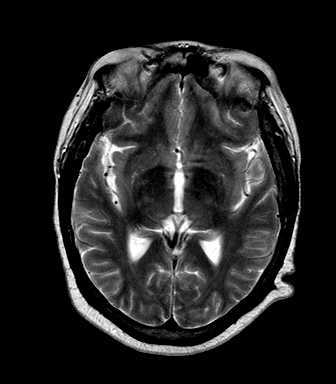
[im 24/24]
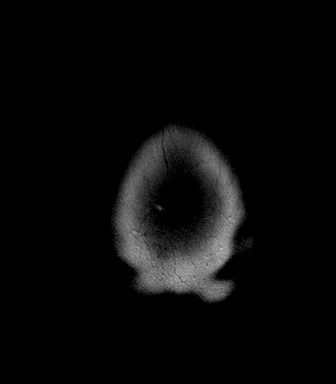

[Series 15: T1 · axial · 2.0mm · 0.58mm/px · z∈[-41,+109]mm · 8 of 76 slices shown (2 of 2)]
[im 1/76]
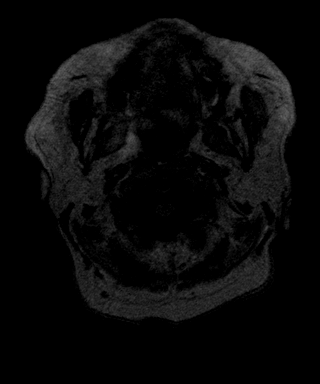
[im 9/76]
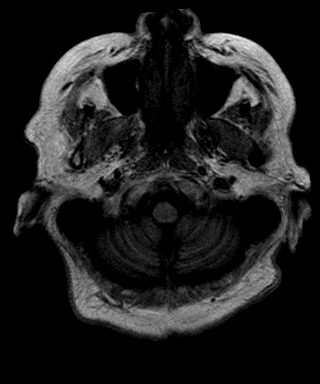
[im 26/76]
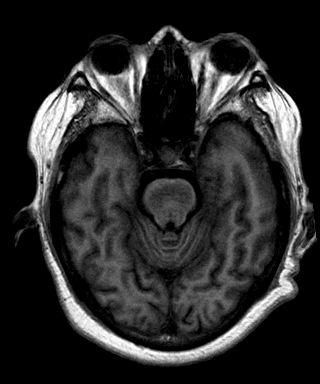
[im 34/76]
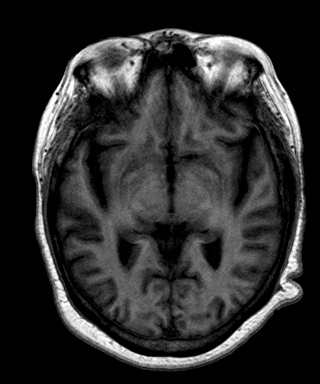
[im 42/76]
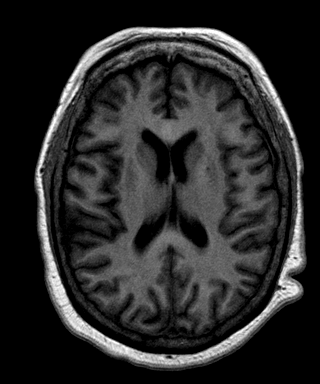
[im 51/76]
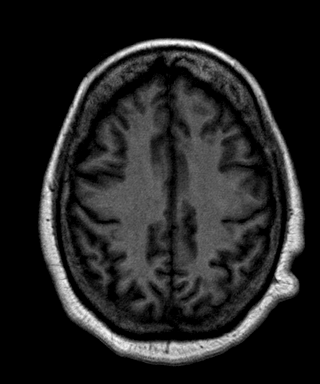
[im 67/76]
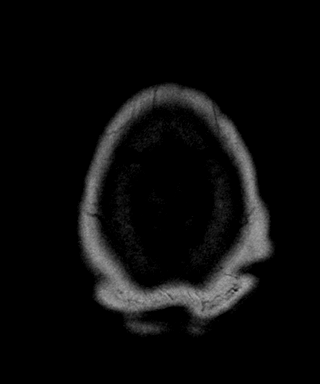
[im 76/76]
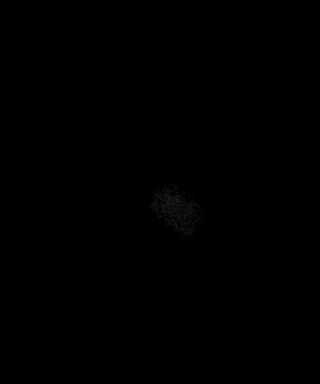

[Series 16: FLAIR · axial · 5.0mm · 0.94mm/px · z∈[-39,+98]mm · 3 of 24 slices shown]
[im 1/24]
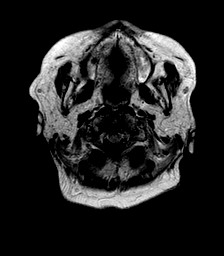
[im 12/24]
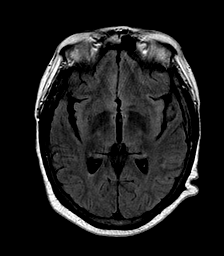
[im 24/24]
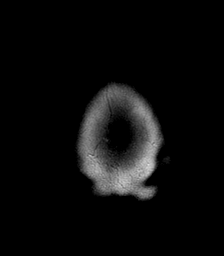

[29 of 48 positions shown; findings below may reference images not displayed]

FINDINGS: There remains markedly abnormal signal within the central pons.  Etiology indeterminate.  This may be related to sequelae of small vessel disease or demyelinating process.   Prior central pontine myelinolysis could conceivably contribute to this appearance.  Glioma felt unlikely given the chronic nature of this appearance.  Other metabolic abnormality or toxic abnormality as cause for such cannot be excluded.  No acute infarct or new area of altered signal intensity noted.  No hydrocephalus, midline shift or intracranial hemorrhage (subarachnoid hemorrhage cannot be excluded by MR).  Mild atrophy.
IMPRESSION: Persistent extensive altered signal intensity within the pons similar in appearance to that of prior exams without acute infarct.  Please see above discussion. 
MR ANGIOGRAPHY OF HEAD:
IMPRESSION: 1.  Moderate focal stenosis questioned along the distal vertical segment of the right internal carotid artery just proximal to the petrous segment.  
2.  Ectasia of vertebrobasilar system with dominate left vertebral artery.  Mild narrowing of the right vertebral artery and mid-aspect of the tortuous basilar artery.

## 2007-04-26 IMAGING — CR DG LUMBAR SPINE COMPLETE 4+V
5 series · 5 of 5 positions shown · non-contrast
Comparison: None.

LUMBAR SPINE - 4  VIEW:

CLINICAL DATA: A double falls. Back pain.

[view not recorded (1 of 5)]
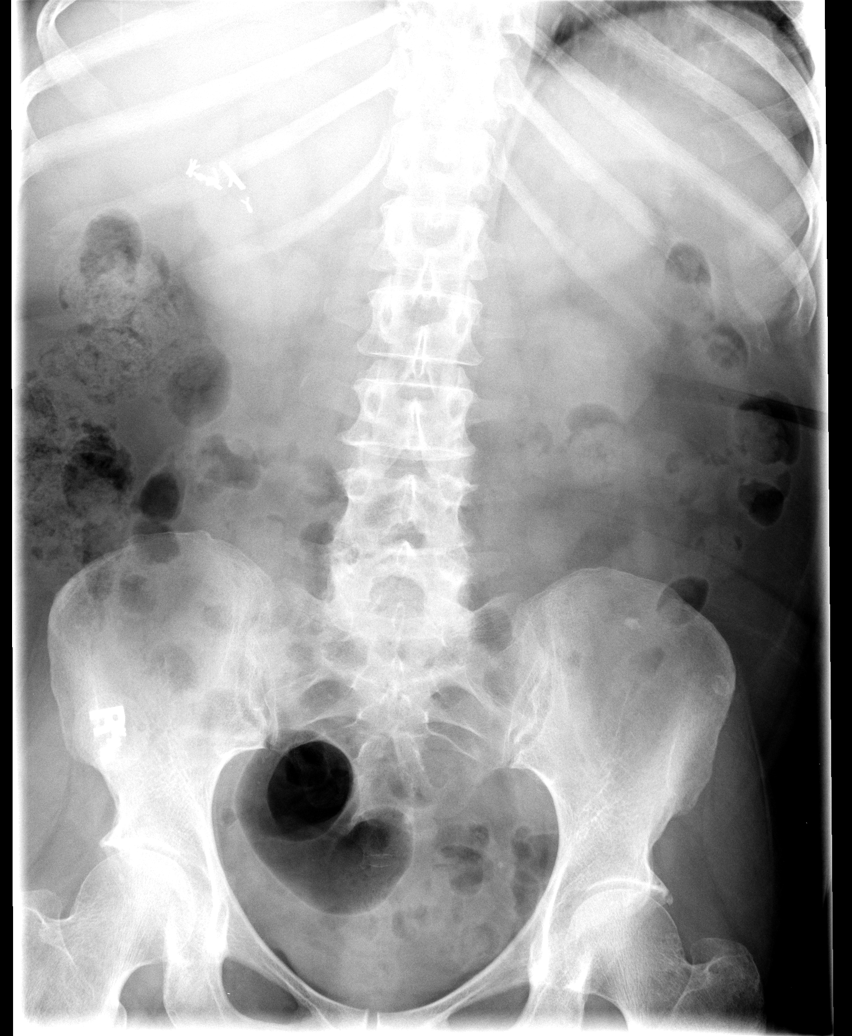

[view not recorded (2 of 5)]
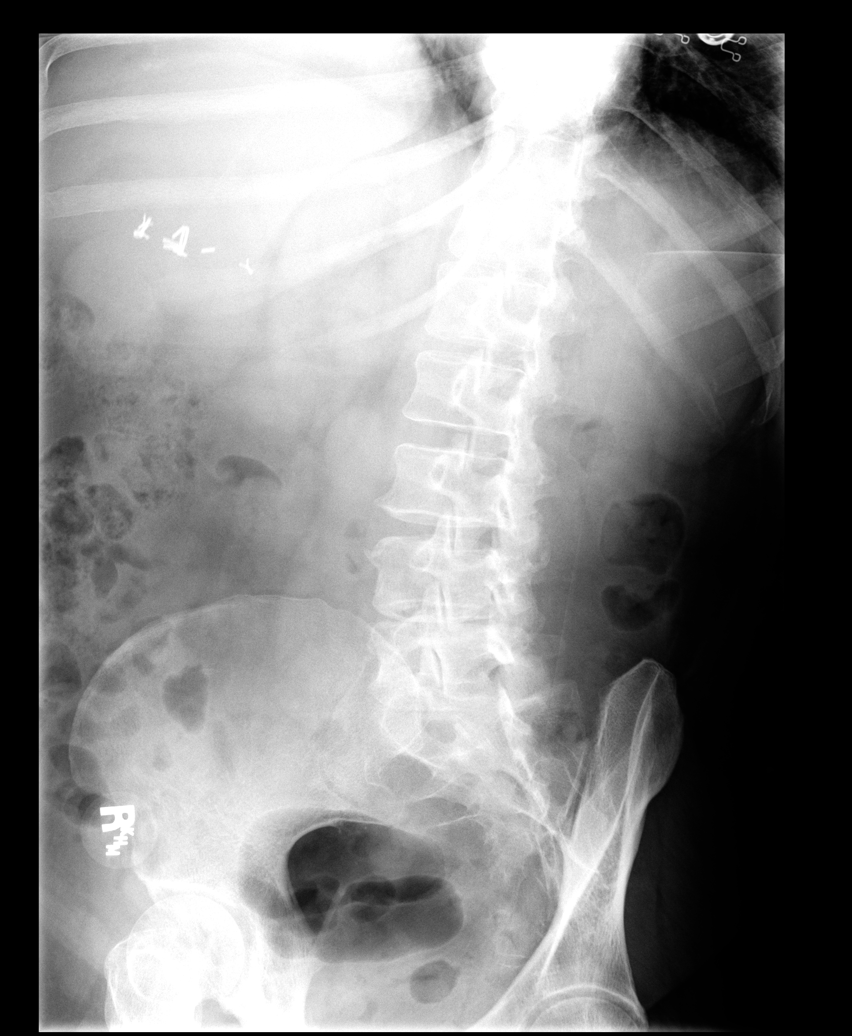

[view not recorded (3 of 5)]
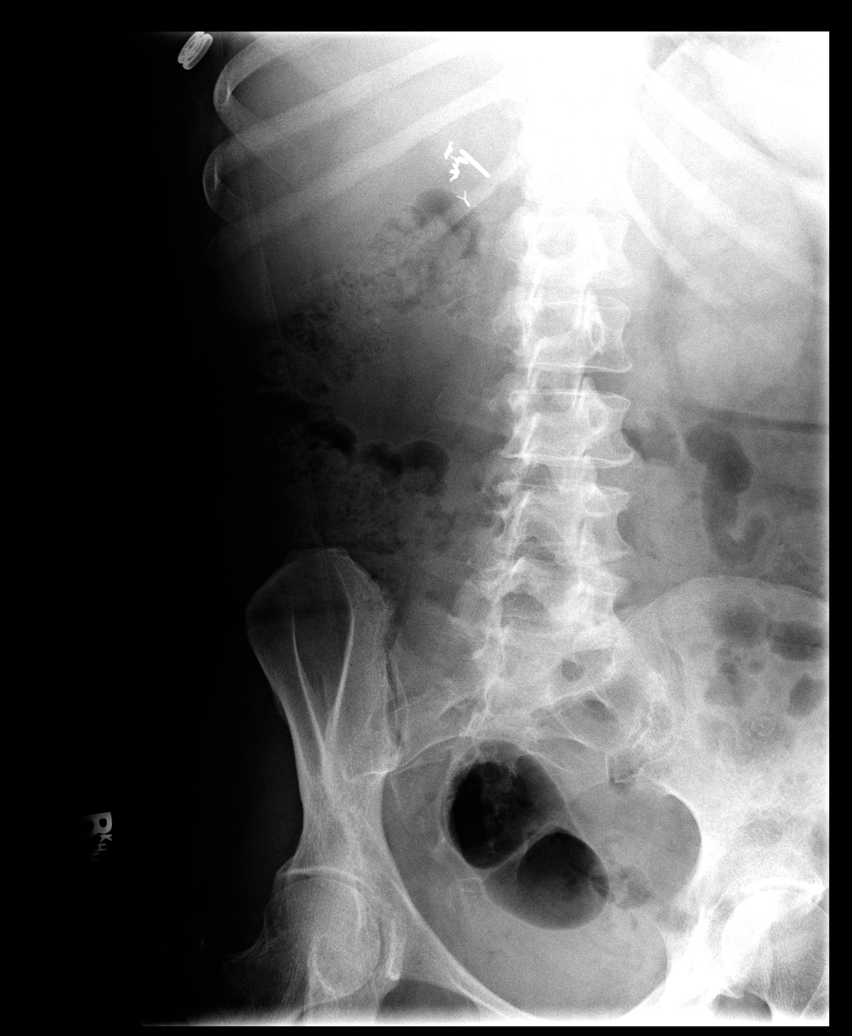

[view not recorded (4 of 5)]
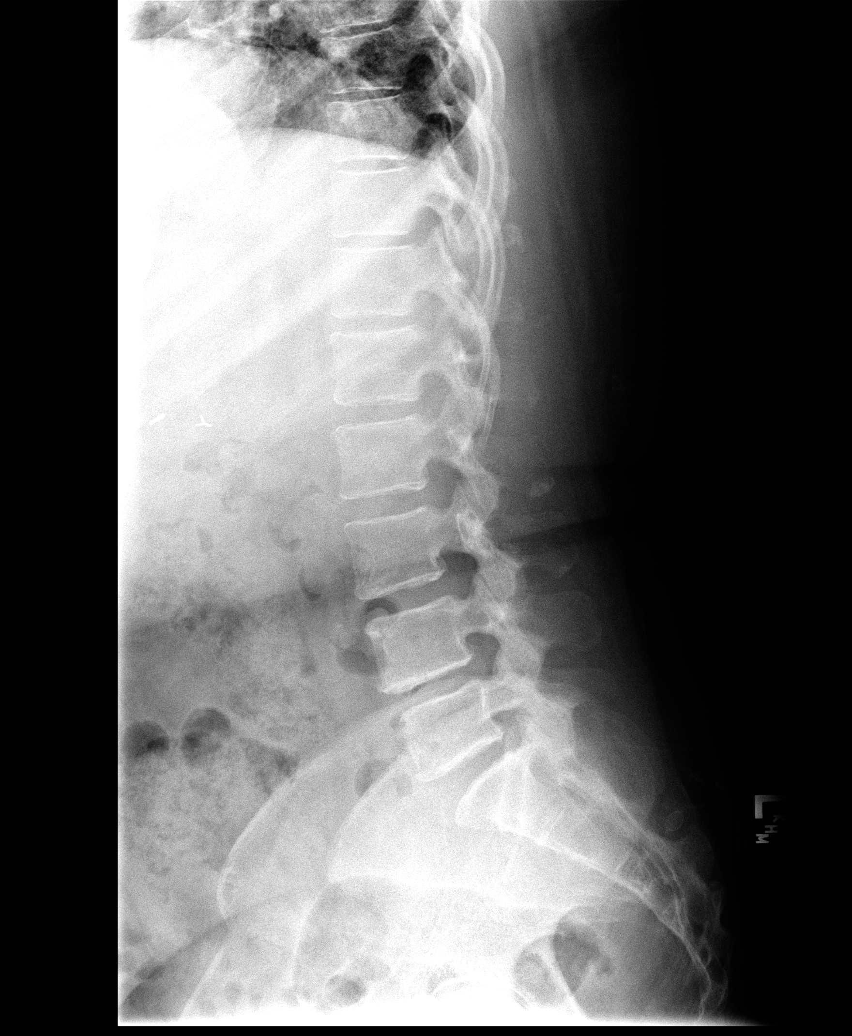

[view not recorded (5 of 5)]
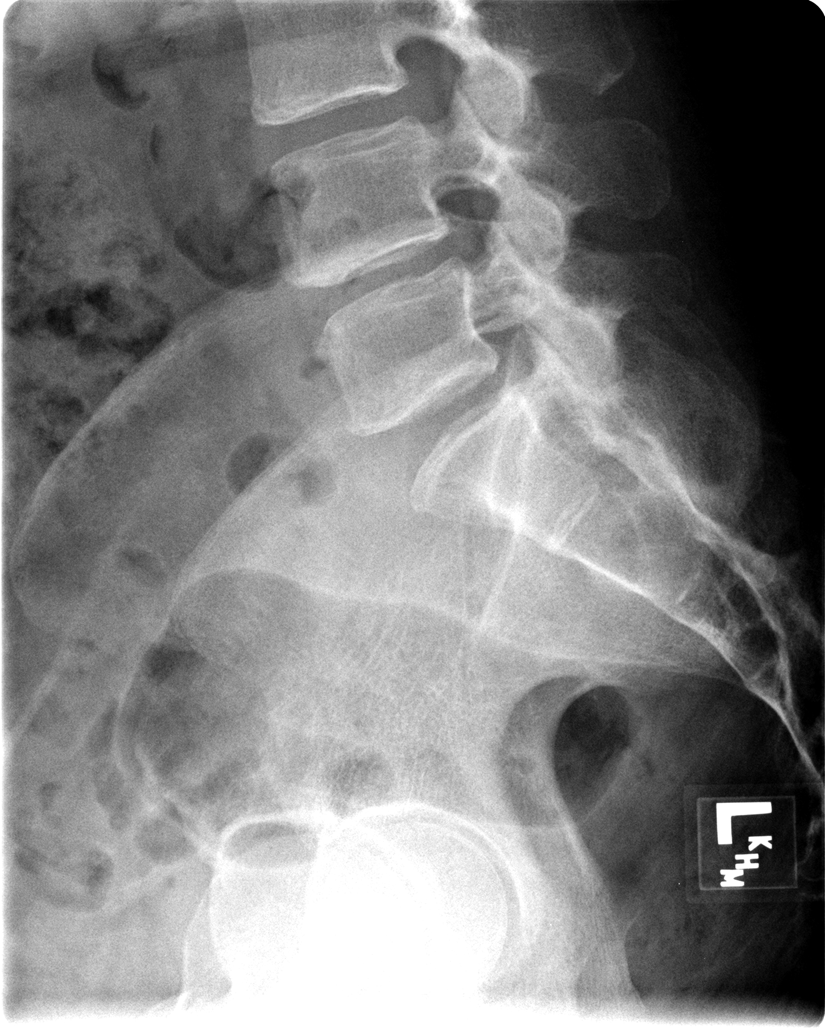

[5 of 5 positions shown; findings below may reference images not displayed]

FINDINGS: Five nonrib-bearing lumbar type vertebral bodies noted. No evidence
for acute fracture or subluxation. Intervertebral disc spaces are preserved.
There is mild facet degeneration in the lower lumbar spine. SI joints are
unremarkable.
IMPRESSION: No acute bony abnormality.

## 2007-04-27 IMAGING — US US CAROTID DUPLEX BILAT
1 series · 14 of 24 positions shown · non-contrast
Comparison: none

CLINICAL DATA: Left-sided weakness.  Multiple falls.
 CAROTID DOPPLER ULTRASOUND:  
 Peak systolic velocities are as follows (in cm/second):
     RIGHT  LEFT
 ICA
 ECA
 CCA
 ICA/CCA RATIO  .64
 Minimal soft plaque is present in the right common and internal carotid arteries.  Doppler analysis demonstrates a low resistance wave-form with sharp upstroke.  The right vertebral artery is antegrade in flow.  
 There is no significant plaque in the left ICA bulb.  Doppler analysis of the ICA on the left demonstrates a low resistance wave-form and sharp upstroke.  Left vertebral artery is antegrade in flow.

[Series 1: unknown · 0.09mm/px · 14 of 55 slices shown]
[im 1/55]
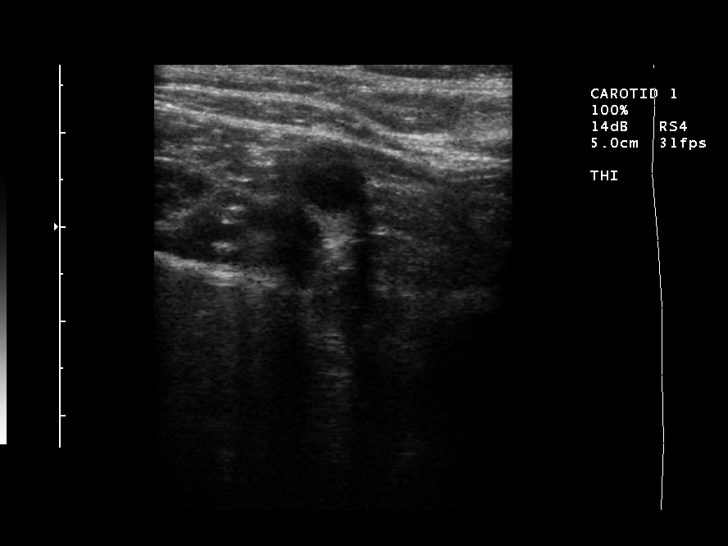
[im 5/55]
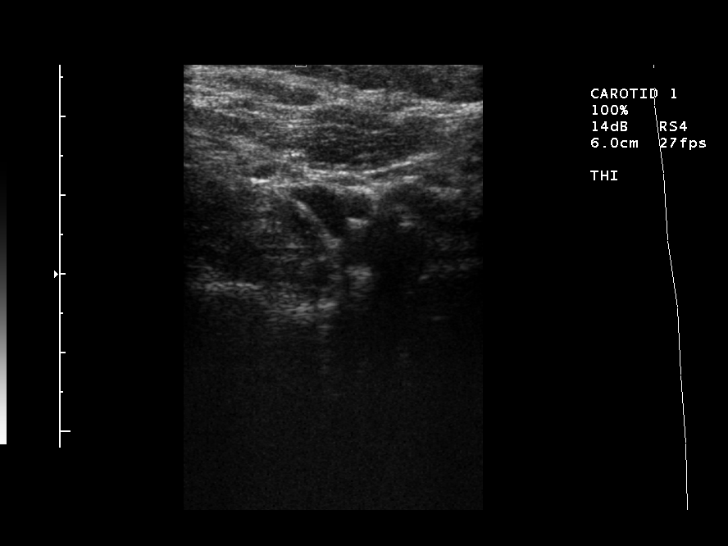
[im 10/55]
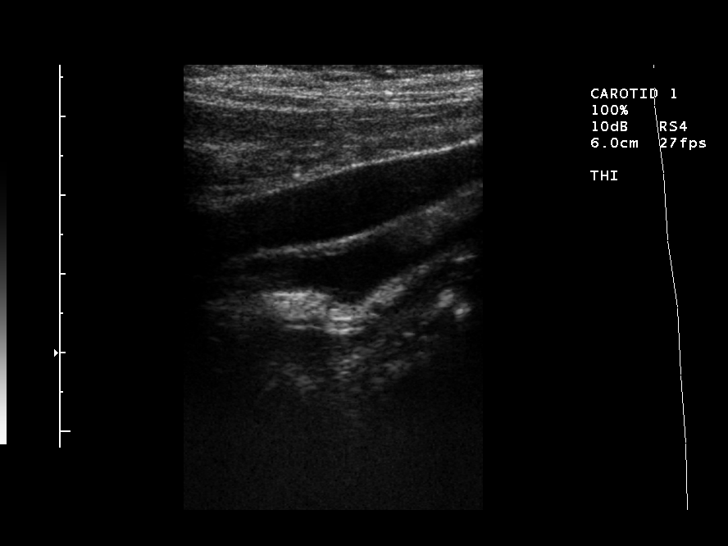
[im 15/55]
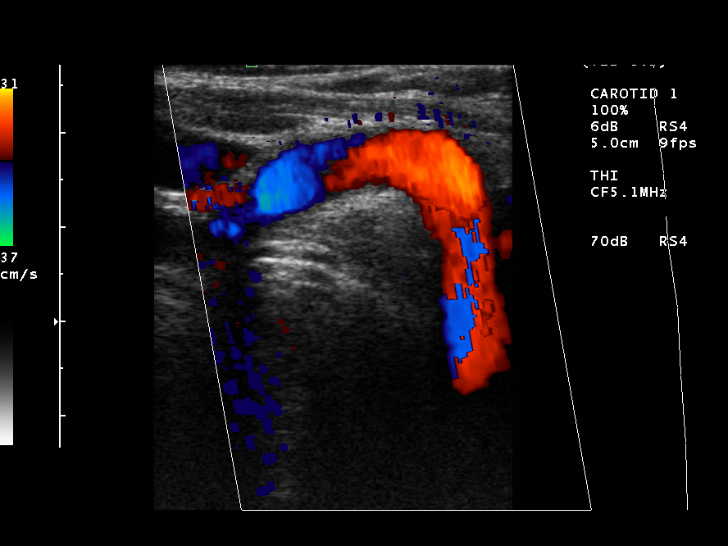
[im 17/55]
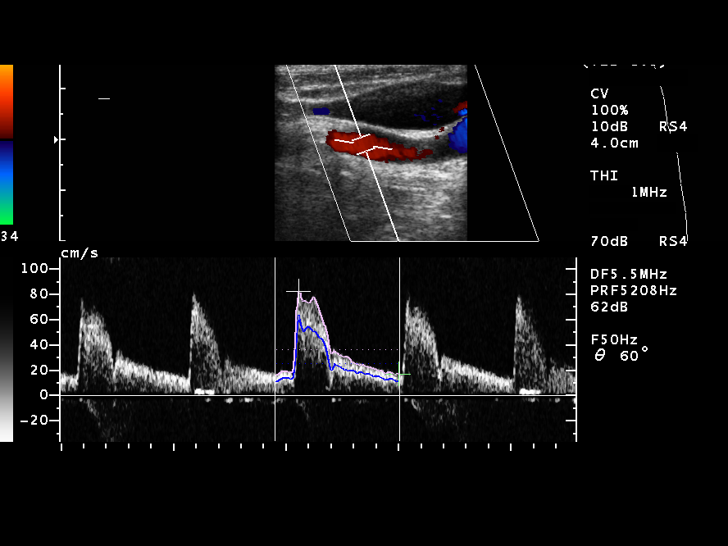
[im 22/55]
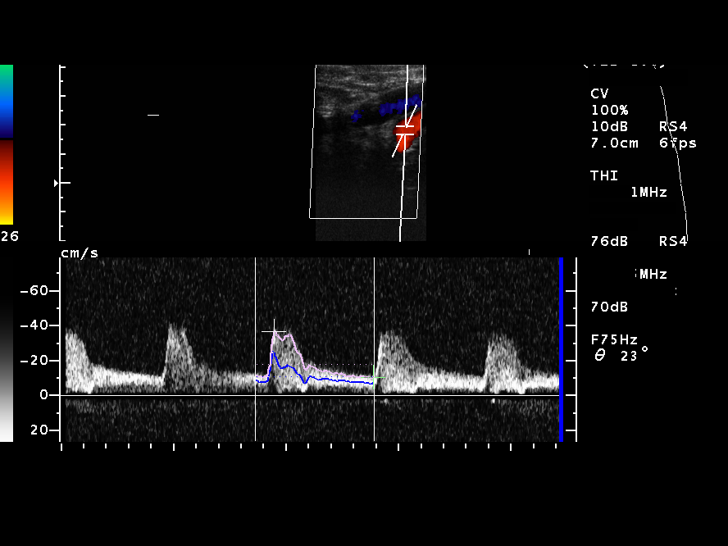
[im 26/55]
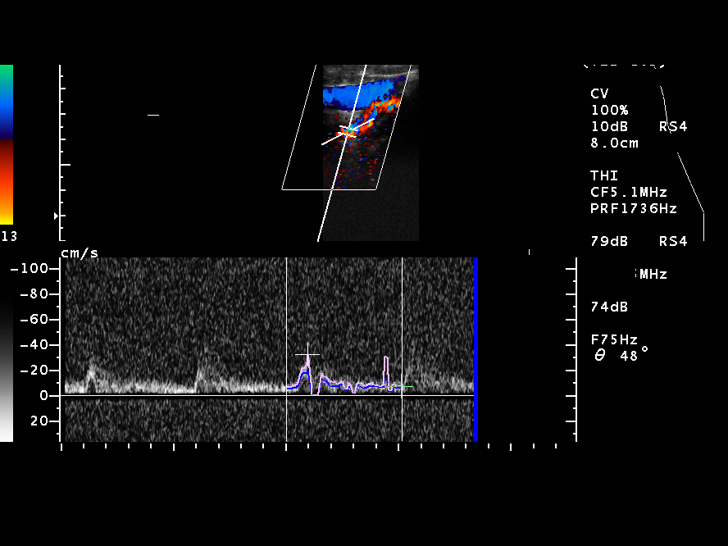
[im 29/55]
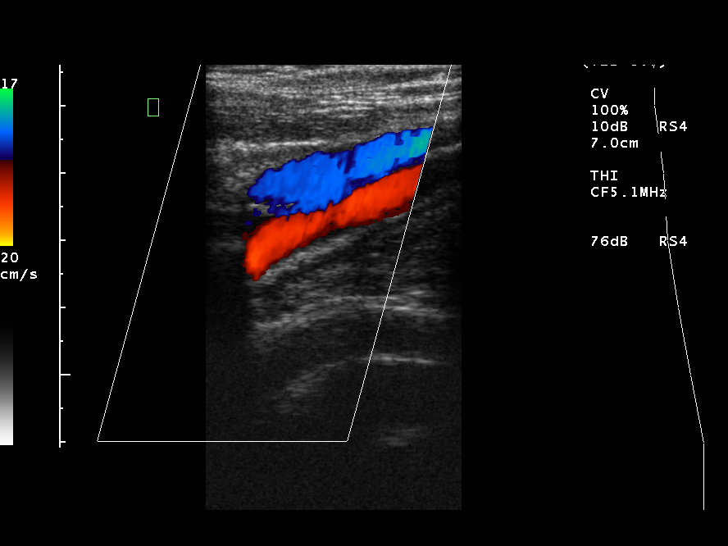
[im 33/55]
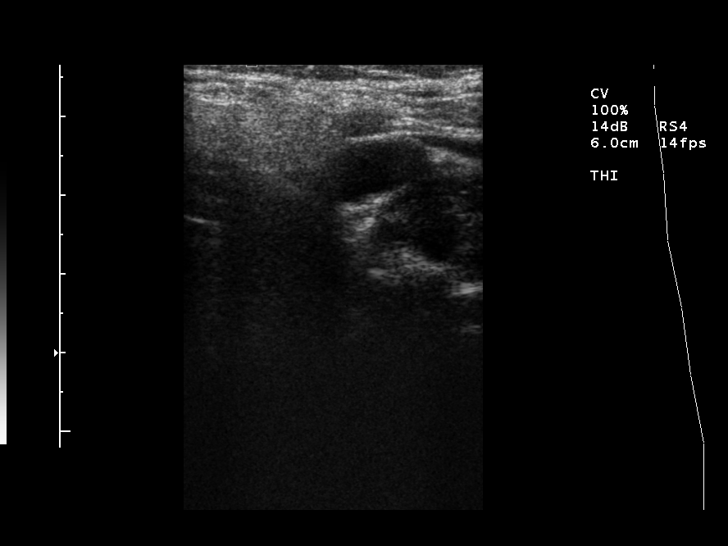
[im 38/55]
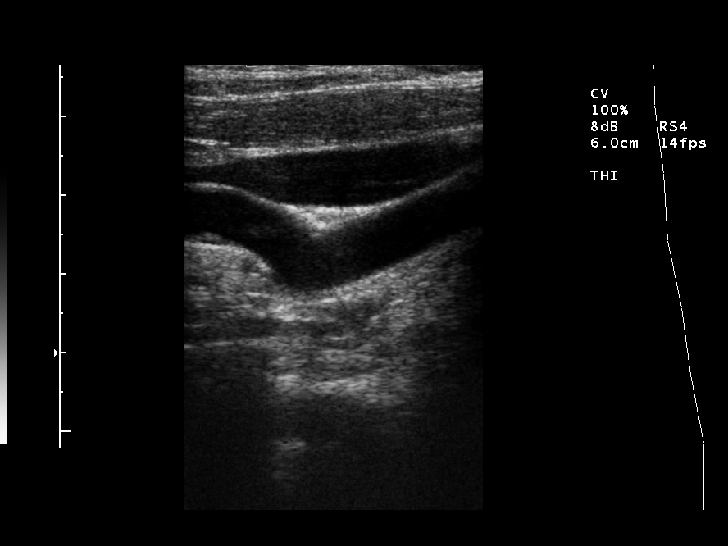
[im 43/55]
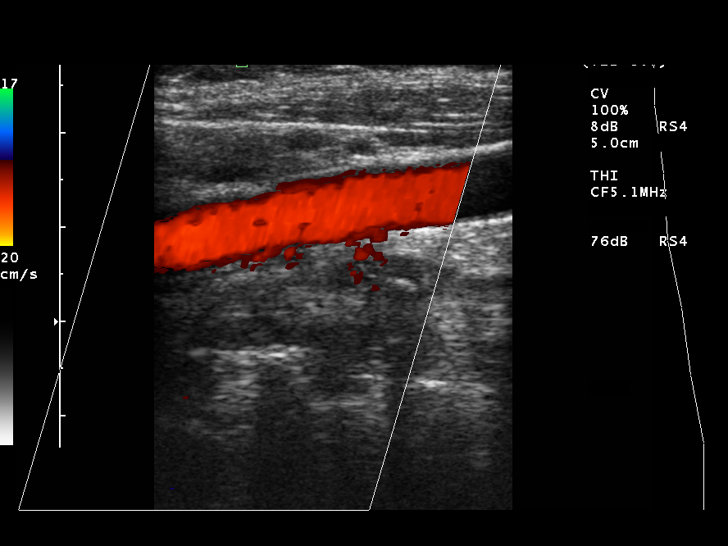
[im 45/55]
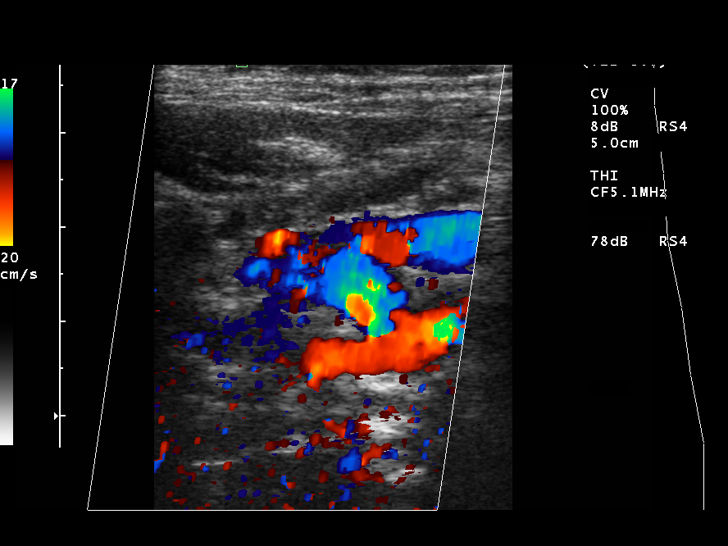
[im 50/55]
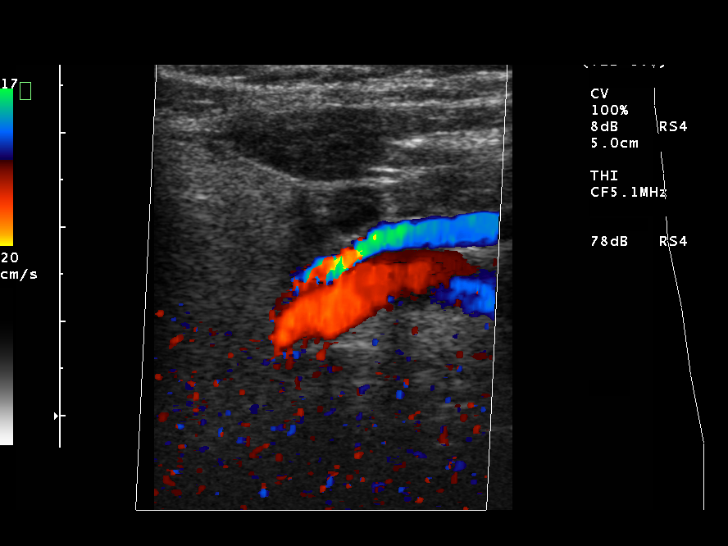
[im 55/55]
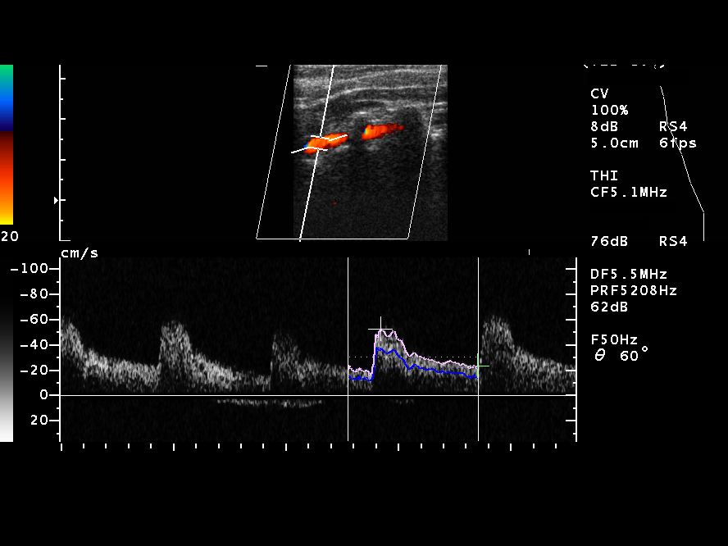

[14 of 24 positions shown; findings below may reference images not displayed]

IMPRESSION: Estimated stenosis in the right ICA is 0 to 50% with minimal plaque.  Estimated stenosis in the left ICA is near zero as little if any plaque is present.

## 2007-04-30 IMAGING — MR MR LUMBAR SPINE W/O CM
4 of 6 series · 11 of 48 positions shown · IV contrast (agent unspecified)
Comparison: Radiographs of 10/01/05.

CLINICAL DATA: Multiple falls, back pain, unsteady gait.
 MRI LUMBAR SPINE WITHOUT CONTRAST:
TECHNIQUE: Multiplanar and multiecho pulse sequences of the lumbar spine, to include the lower thoracic and upper sacral regions, were obtained according to standard protocol without IV contrast.

[Series 3: T2 · sagittal · 4.0mm · 0.35mm/px · 3 of 13 slices shown (1 of 2)]
[im 3/13]
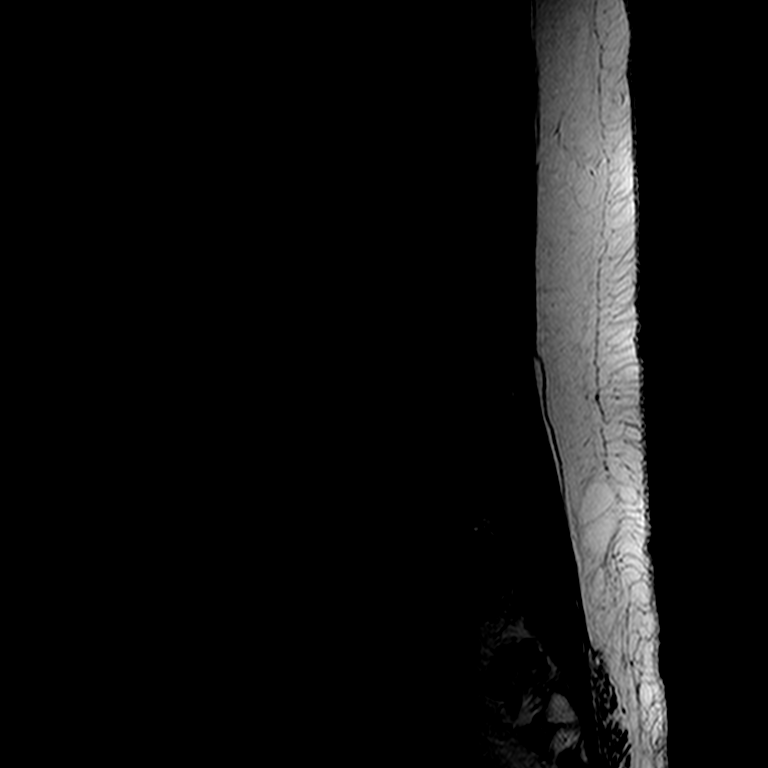
[im 8/13]
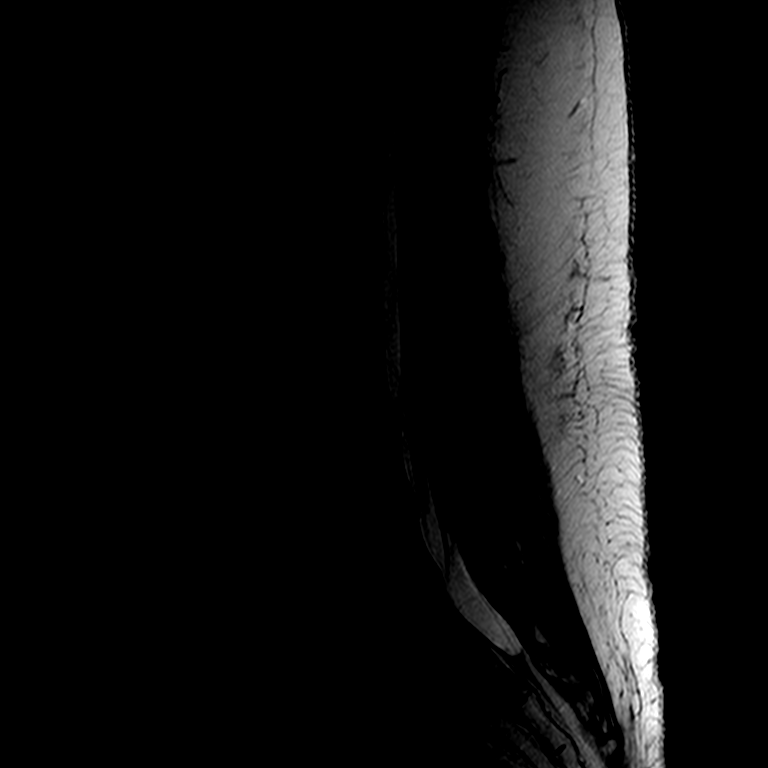
[im 13/13]
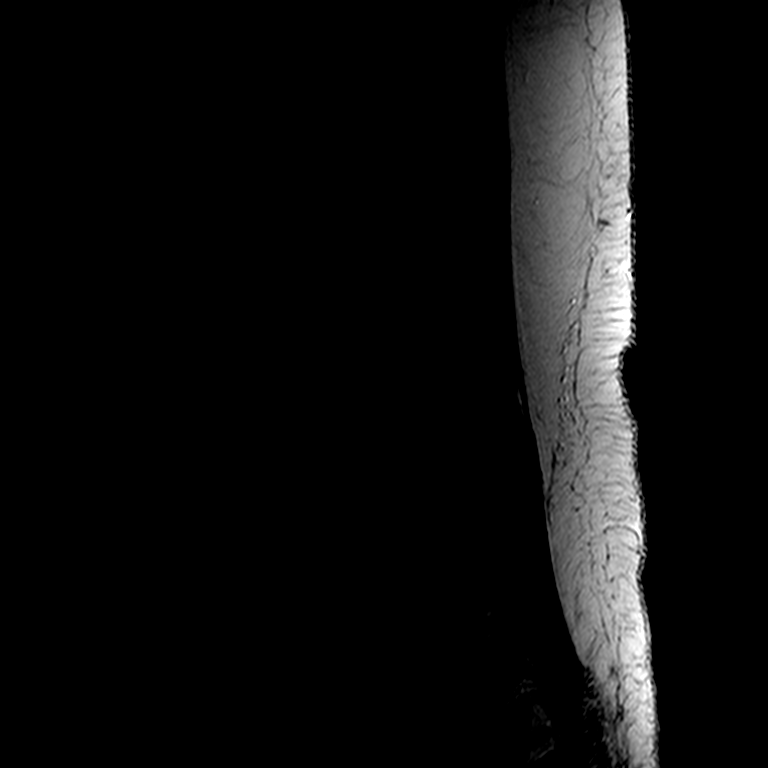

[Series 4: T1 · sagittal · 4.0mm · 0.35mm/px · 3 of 13 slices shown (1 of 2)]
[im 3/13]
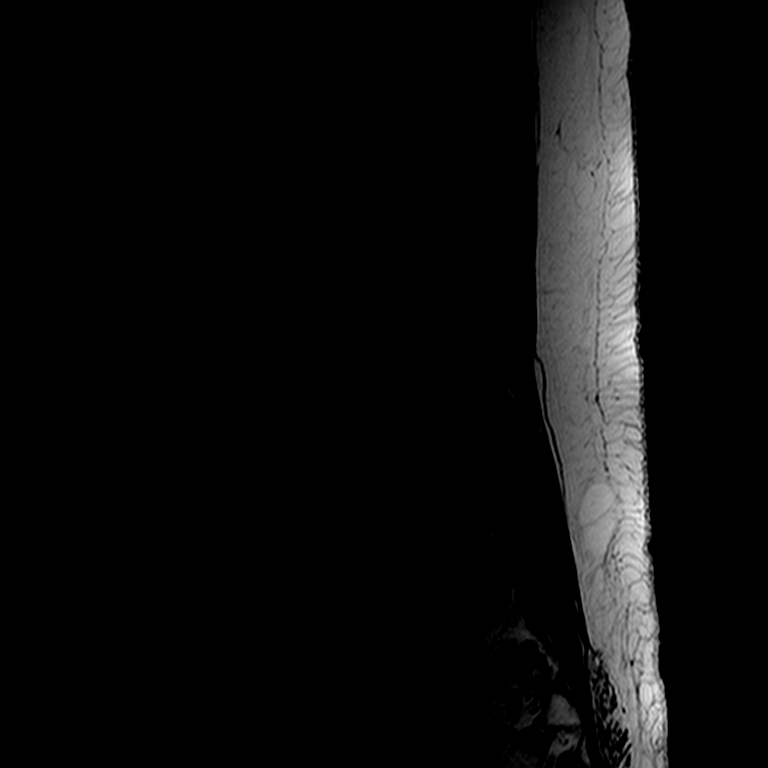
[im 8/13]
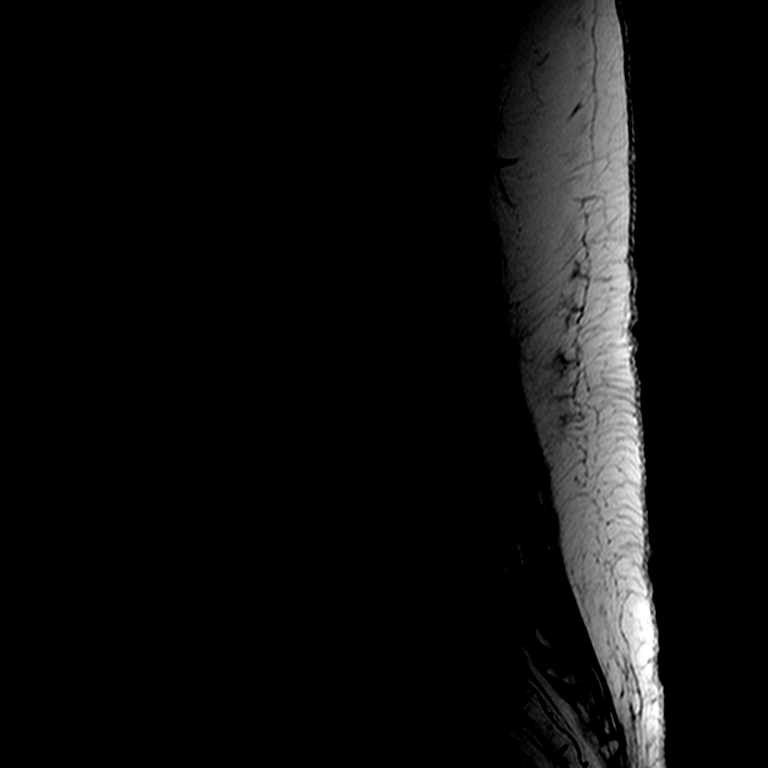
[im 13/13]
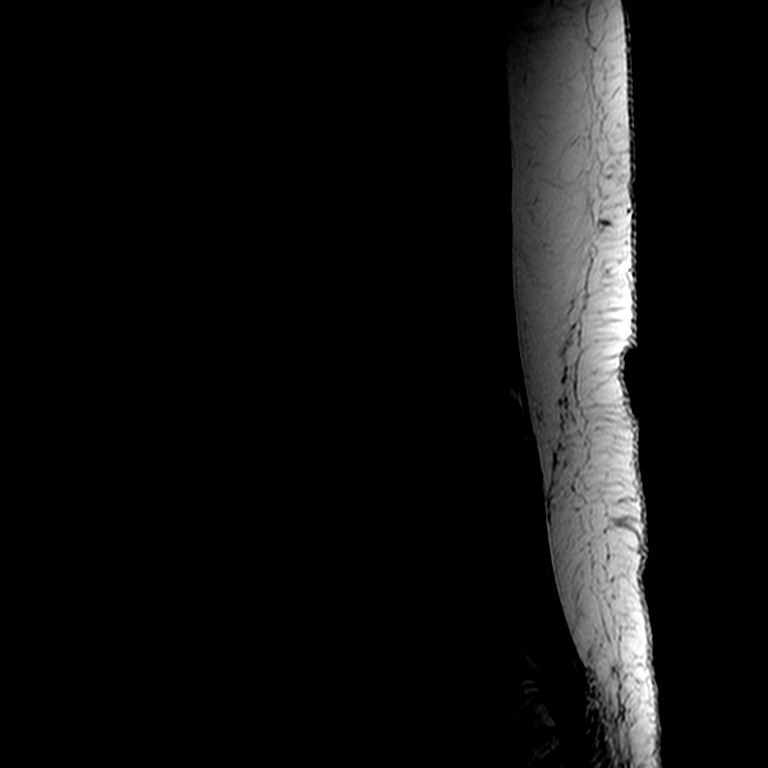

[Series 7: T2 · axial · 4.0mm · 0.32mm/px · z∈[-83,+65]mm · 3 of 25 slices shown (2 of 2)]
[im 5/25]
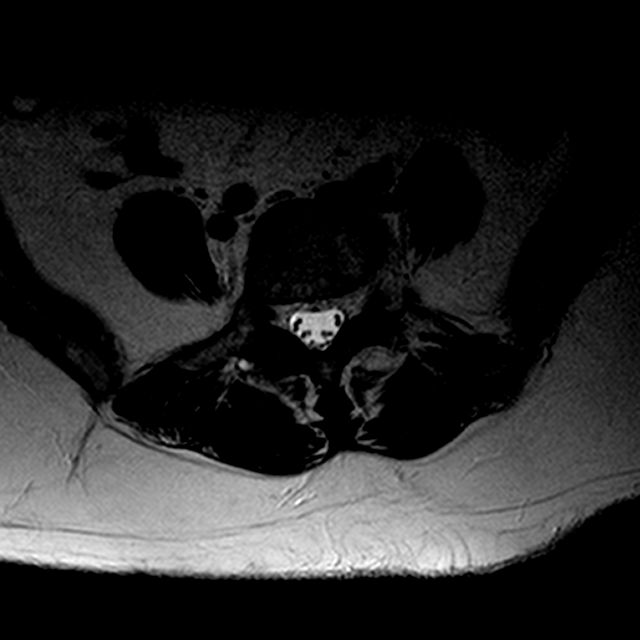
[im 14/25]
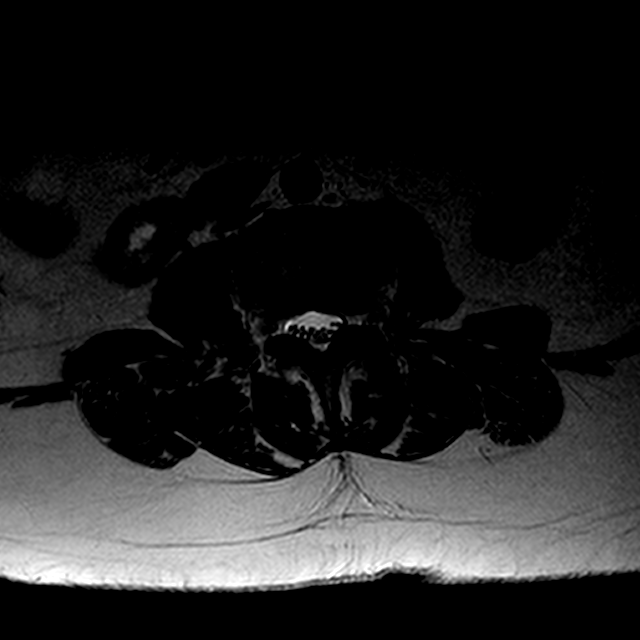
[im 22/25]
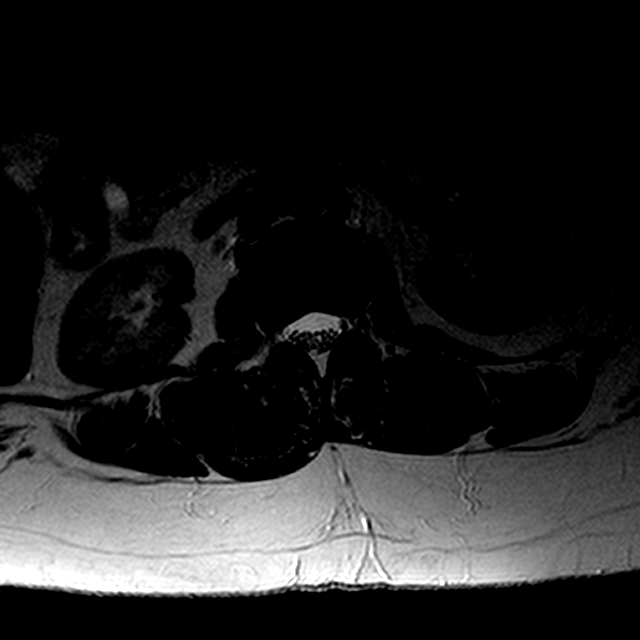

[Series 9: T1 · axial · 4.0mm · 0.40mm/px · z∈[-85,-6]mm · 2 of 25 slices shown (2 of 2)]
[im 5/25]
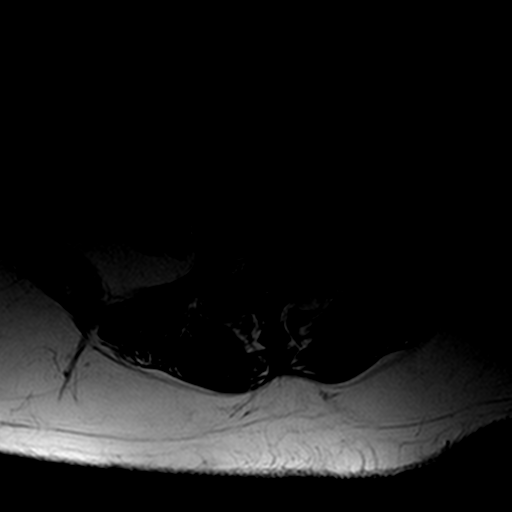
[im 14/25]
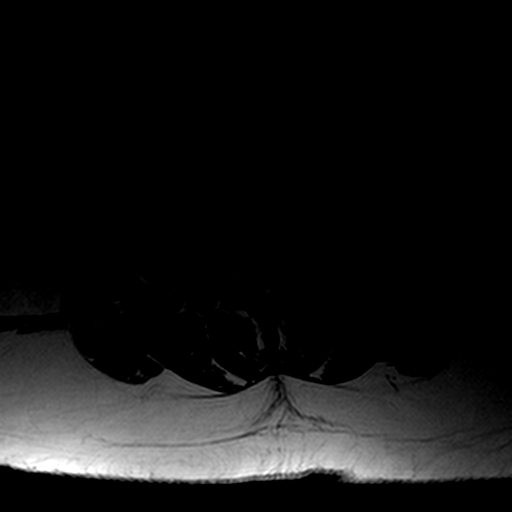

[11 of 48 positions shown; findings below may reference images not displayed]

FINDINGS: Normal alignment.  No fracture or mass.  The conus medullaris is normal.  
 L1-2:  Negative.
 L2-3:  Negative.
 L3-4:  Diffuse bilateral disc protrusion which is more prominent on the left extending into the left foramen.  This is causing impingement of the left L3 and L4 nerve roots. 
 L4-5:  Left foraminal disc protrusion with some impingement of the left L5 nerve root in the lateral recess.  There is mild spinal stenosis and mild facet arthropathy.
 L5-S1:  Disc degeneration with a broad based shallow disc protrusion.  There is mild facet arthropathy.
IMPRESSION: 1. Left sided disc protrusions at L3-4 and L4-5.  
 2. Mild spinal stenosis at L4-5.

## 2007-05-06 ENCOUNTER — Encounter: Payer: Self-pay | Admitting: Family Medicine

## 2007-05-06 LAB — CONVERTED CEMR LAB
AST: 18 units/L (ref 0–37)
Alkaline Phosphatase: 133 units/L — ABNORMAL HIGH (ref 39–117)
BUN: 23 mg/dL (ref 6–23)
Calcium: 9.3 mg/dL (ref 8.4–10.5)
Chloride: 109 meq/L (ref 96–112)
Creatinine, Ser: 0.68 mg/dL (ref 0.40–1.20)
LDL Cholesterol: 108 mg/dL — ABNORMAL HIGH (ref 0–99)
Total Protein: 8.1 g/dL (ref 6.0–8.3)
Triglycerides: 208 mg/dL — ABNORMAL HIGH (ref <150)

## 2007-05-09 ENCOUNTER — Ambulatory Visit (HOSPITAL_COMMUNITY): Admission: RE | Admit: 2007-05-09 | Discharge: 2007-05-09 | Payer: Self-pay | Admitting: Ophthalmology

## 2007-05-14 ENCOUNTER — Ambulatory Visit: Payer: Self-pay | Admitting: Family Medicine

## 2007-06-02 ENCOUNTER — Emergency Department (HOSPITAL_COMMUNITY): Admission: EM | Admit: 2007-06-02 | Discharge: 2007-06-02 | Payer: Self-pay | Admitting: Emergency Medicine

## 2007-06-02 ENCOUNTER — Telehealth (INDEPENDENT_AMBULATORY_CARE_PROVIDER_SITE_OTHER): Payer: Self-pay | Admitting: Internal Medicine

## 2007-06-04 ENCOUNTER — Ambulatory Visit: Payer: Self-pay | Admitting: Family Medicine

## 2007-06-17 ENCOUNTER — Ambulatory Visit: Payer: Self-pay | Admitting: Family Medicine

## 2007-06-19 ENCOUNTER — Encounter (INDEPENDENT_AMBULATORY_CARE_PROVIDER_SITE_OTHER): Payer: Self-pay | Admitting: *Deleted

## 2007-06-19 DIAGNOSIS — M810 Age-related osteoporosis without current pathological fracture: Secondary | ICD-10-CM | POA: Insufficient documentation

## 2007-06-19 DIAGNOSIS — M549 Dorsalgia, unspecified: Secondary | ICD-10-CM

## 2007-06-19 DIAGNOSIS — R7302 Impaired glucose tolerance (oral): Secondary | ICD-10-CM | POA: Insufficient documentation

## 2007-06-19 DIAGNOSIS — G4733 Obstructive sleep apnea (adult) (pediatric): Secondary | ICD-10-CM

## 2007-06-26 ENCOUNTER — Ambulatory Visit (HOSPITAL_COMMUNITY): Admission: RE | Admit: 2007-06-26 | Discharge: 2007-06-26 | Payer: Self-pay | Admitting: Family Medicine

## 2007-06-27 DIAGNOSIS — D509 Iron deficiency anemia, unspecified: Secondary | ICD-10-CM | POA: Insufficient documentation

## 2007-06-27 DIAGNOSIS — I10 Essential (primary) hypertension: Secondary | ICD-10-CM | POA: Insufficient documentation

## 2007-07-22 ENCOUNTER — Encounter: Payer: Self-pay | Admitting: Family Medicine

## 2007-07-22 LAB — CONVERTED CEMR LAB
Albumin: 4.4 g/dL (ref 3.5–5.2)
HDL: 48 mg/dL (ref >39)
Indirect Bilirubin: 0.2 mg/dL (ref 0.0–0.9)
LDL Cholesterol: 103 mg/dL — ABNORMAL HIGH (ref 0–99)
Total Protein: 8.7 g/dL — ABNORMAL HIGH (ref 6.0–8.3)
Triglycerides: 163 mg/dL — ABNORMAL HIGH (ref <150)
VLDL: 33 mg/dL (ref 0–40)

## 2007-07-25 ENCOUNTER — Ambulatory Visit: Payer: Self-pay | Admitting: Family Medicine

## 2007-08-11 IMAGING — MG MM SCREEN MAMMOGRAM BILATERAL
4 series · 4 of 4 positions shown · non-contrast
Comparison: none

DG SCREEN MAMMOGRAM BILATERAL
Bilateral CC and MLO view(s) were taken.

DIGITAL SCREENING MAMMOGRAM WITH CAD:
There is a fibrofatty pattern.  No masses or malignant type calcifications are identified.  
Compared with prior studies.

[R CC]
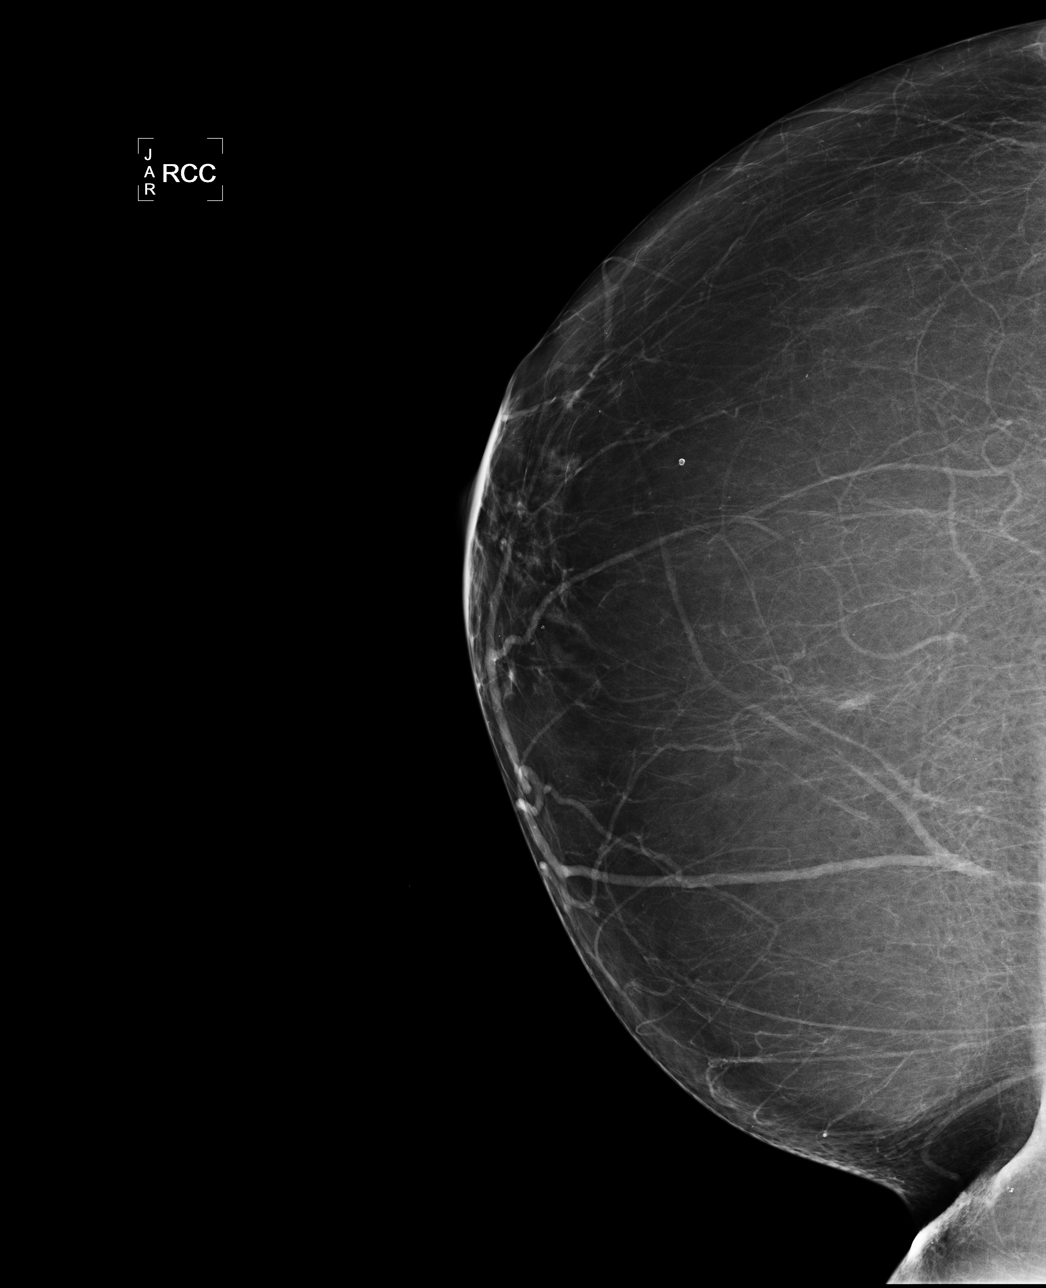

[L CC]
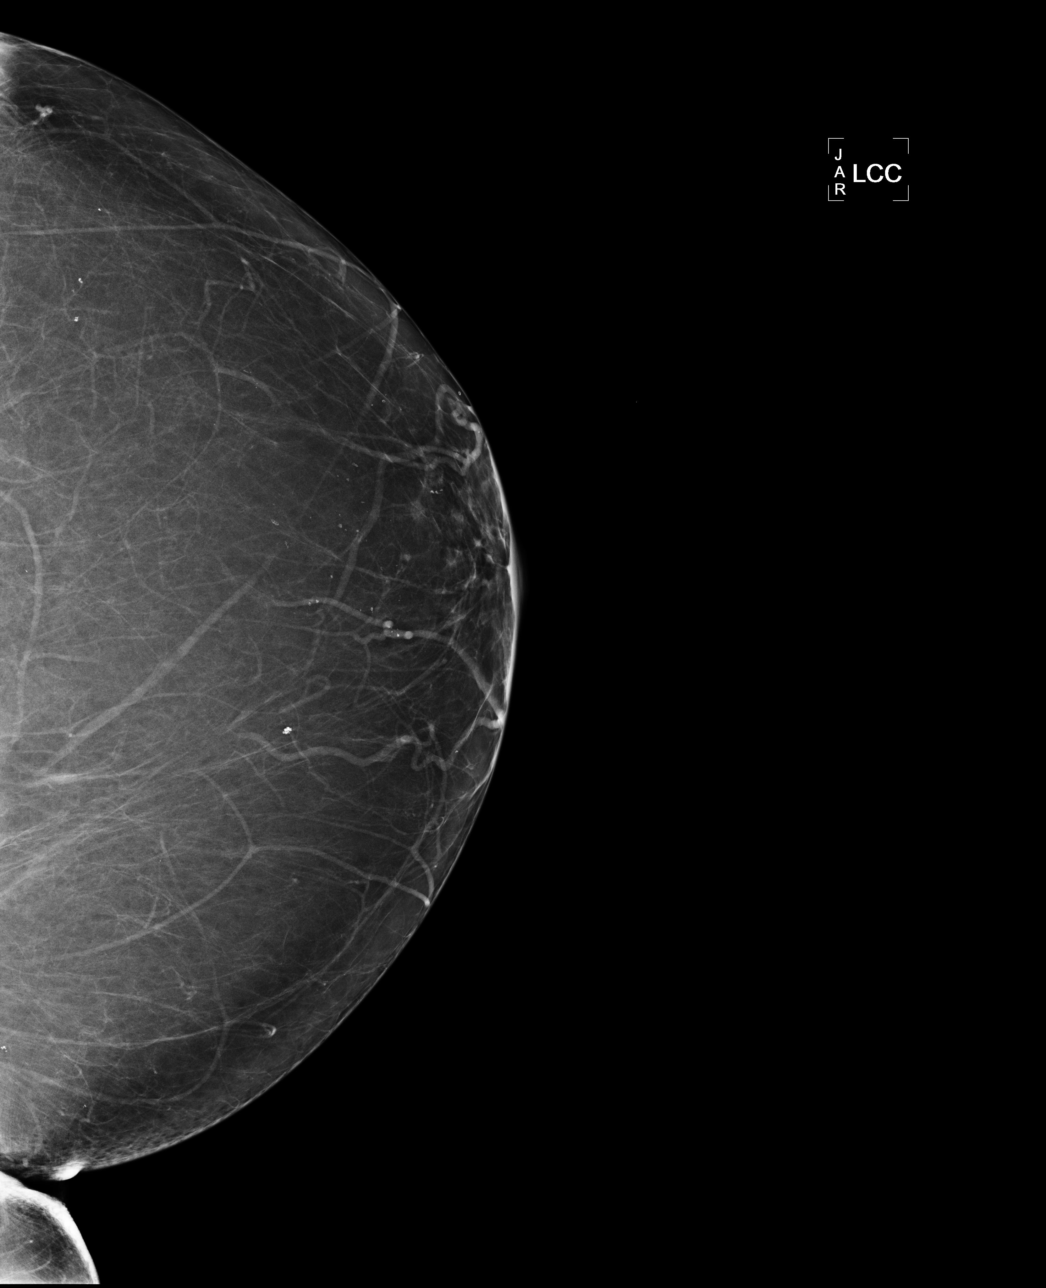

[L MLO]
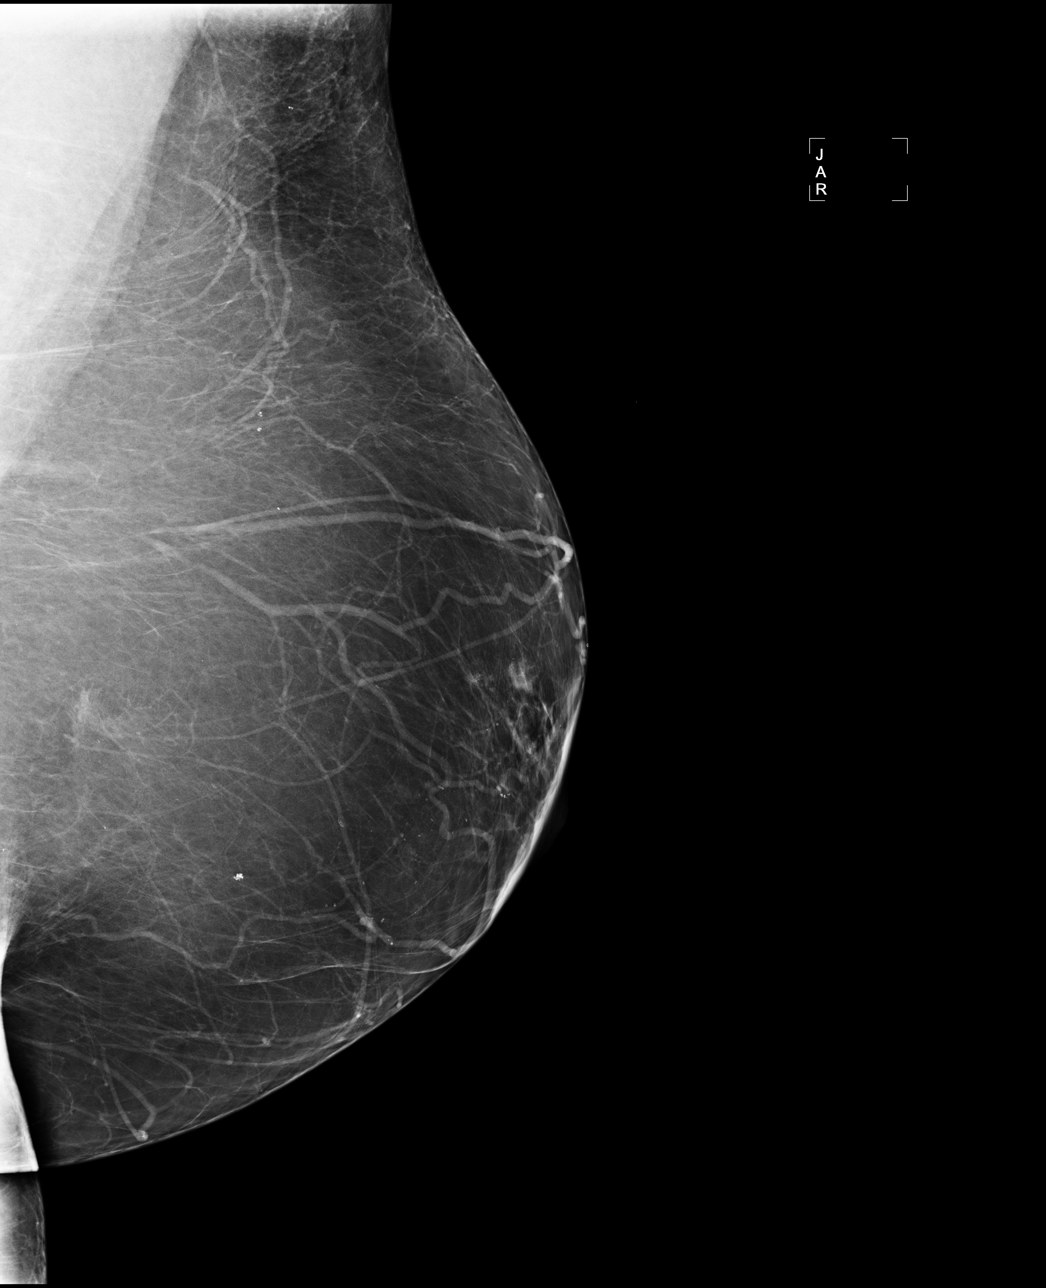

[R MLO]
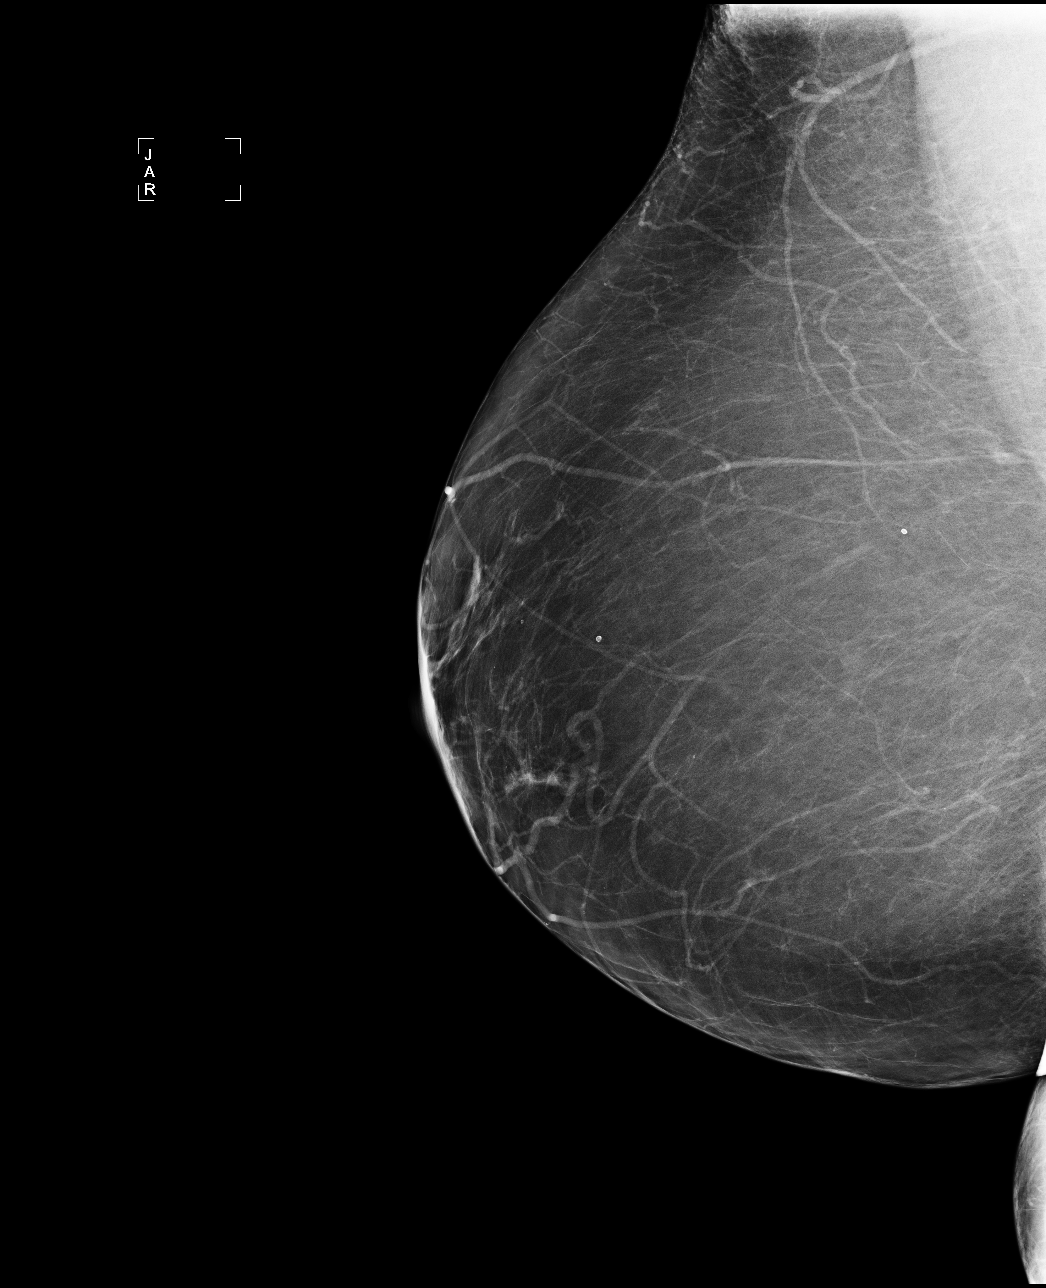

[4 of 4 positions shown; findings below may reference images not displayed]

IMPRESSION: No specific mammographic evidence of malignancy.  Next screening mammogram is recommended in one 
year.

ASSESSMENT: Negative - BI-RADS 1

Screening mammogram in 1 year.
ANALYZED BY COMPUTER AIDED DETECTION. , THIS PROCEDURE WAS A DIGITAL MAMMOGRAM.

## 2007-08-13 IMAGING — CT CT ABDOMEN W/ CM
1 of 3 series · 14 of 32 positions shown, 19 images · IV contrast (CONTRAST)
Comparison: none

CLINICAL DATA: Left flank pain

[Series 2902: — · axial · 0.66mm/px · z∈[+1327,+1722]mm · 14 of 91 slices shown, 19 images]
[im 6/91  soft-tissue]
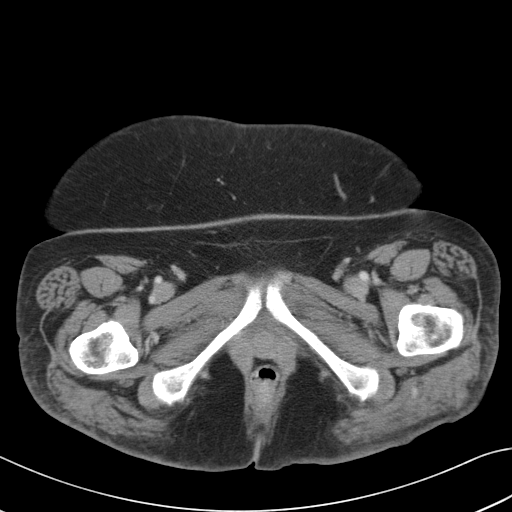
[im 6/91  bone]
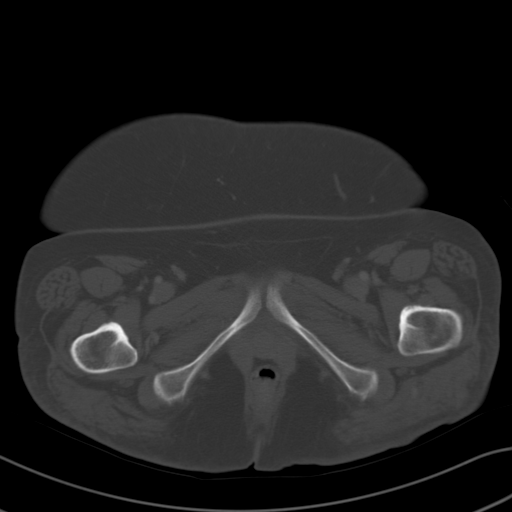
[im 11/91  soft-tissue]
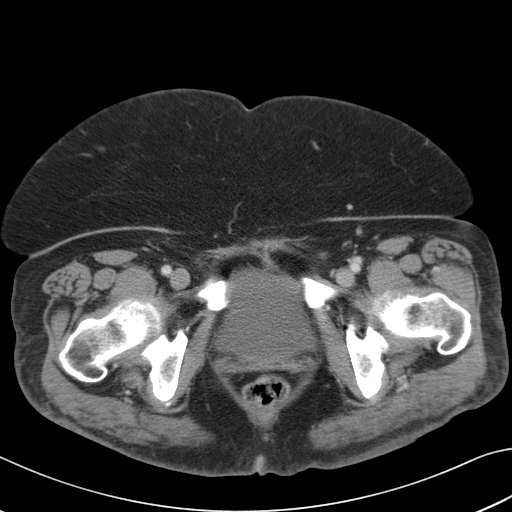
[im 22/91  soft-tissue]
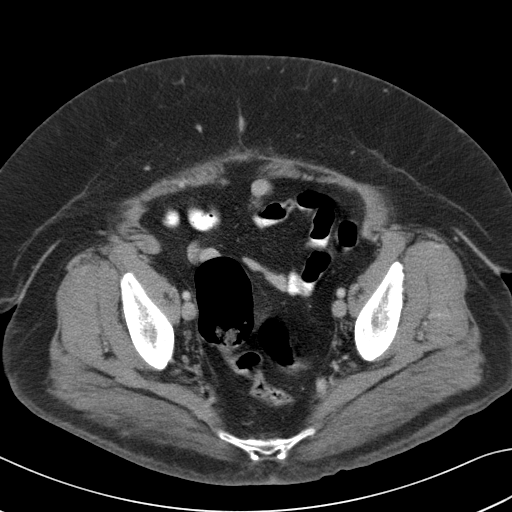
[im 27/91  soft-tissue]
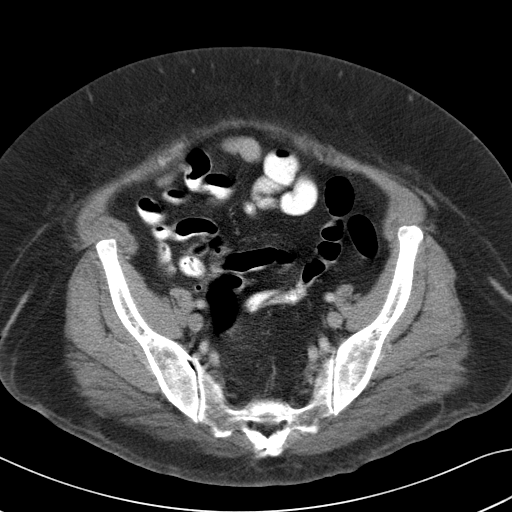
[im 32/91  soft-tissue]
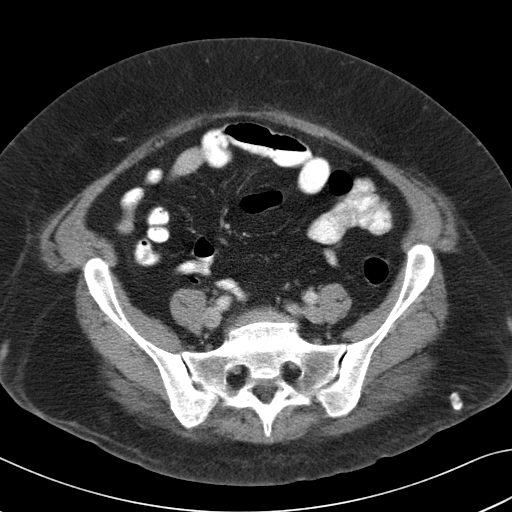
[im 38/91  soft-tissue]
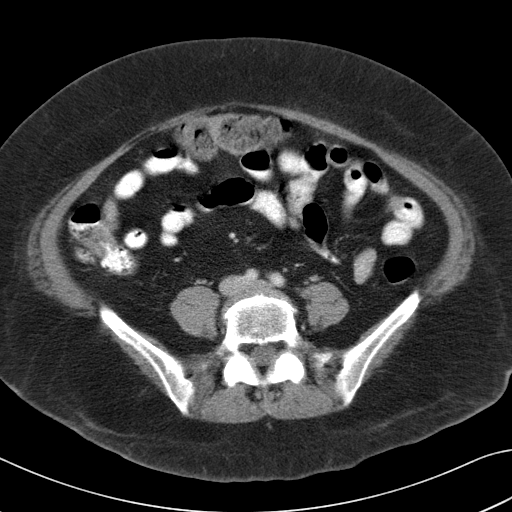
[im 48/91  soft-tissue]
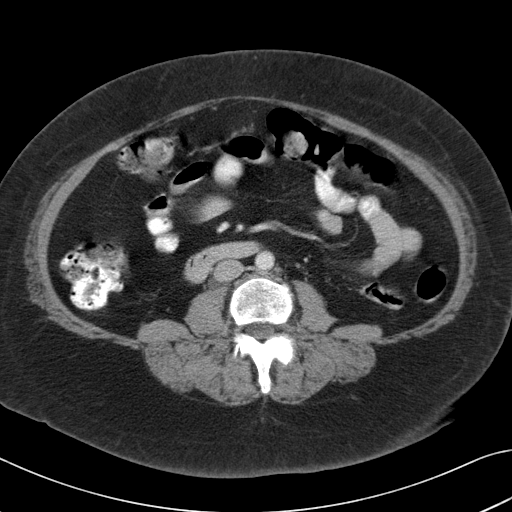
[im 53/91  soft-tissue]
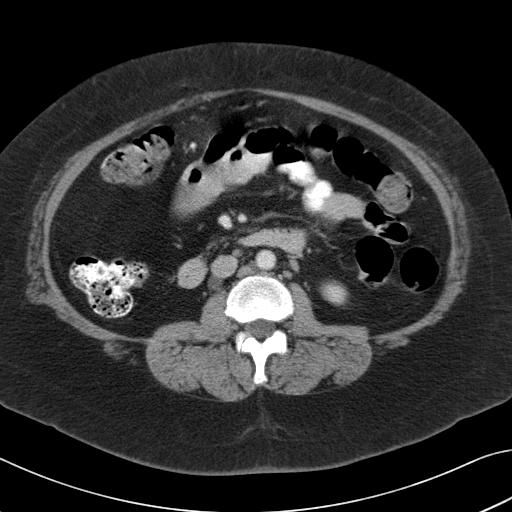
[im 59/91  soft-tissue]
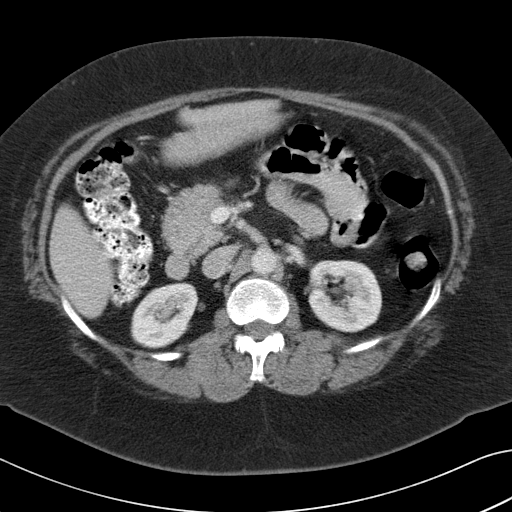
[im 59/91  bone]
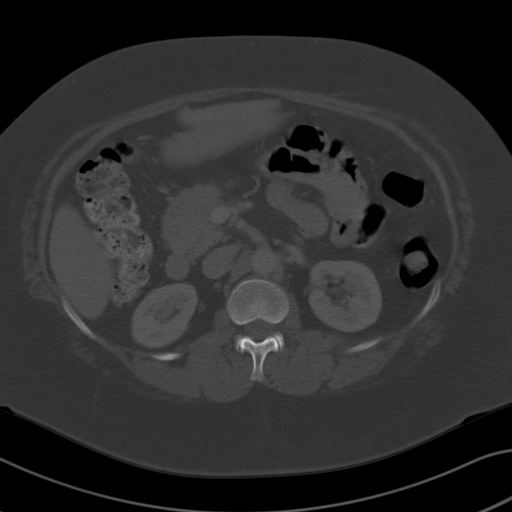
[im 64/91  soft-tissue]
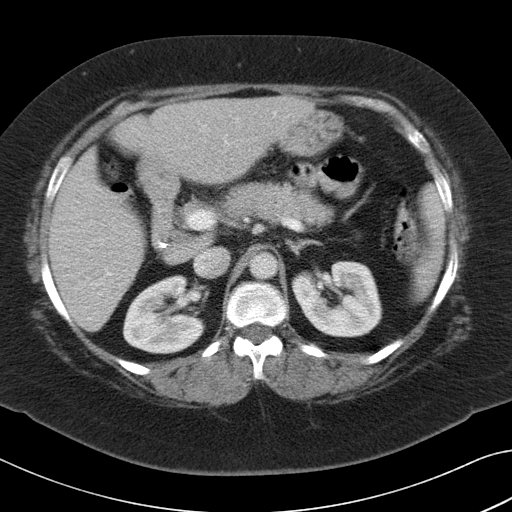
[im 69/91  soft-tissue]
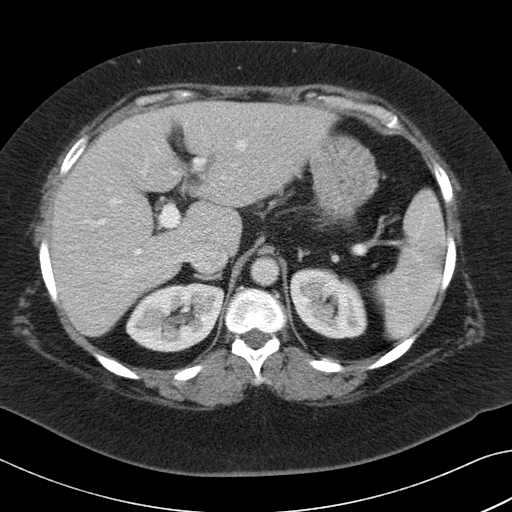
[im 69/91  lung]
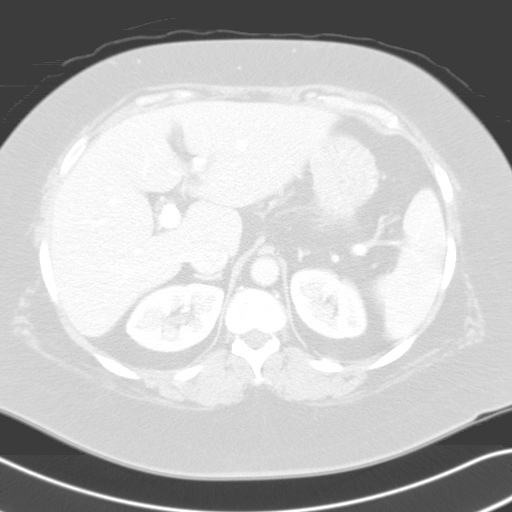
[im 75/91  lung]
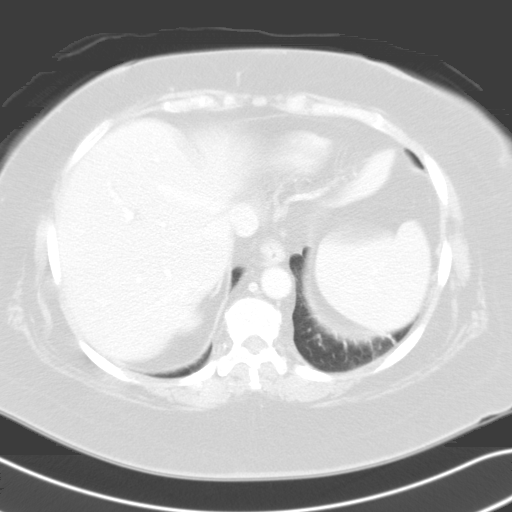
[im 80/91  soft-tissue]
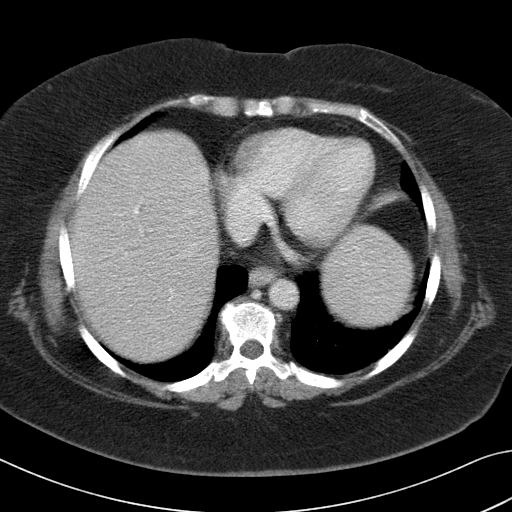
[im 80/91  lung]
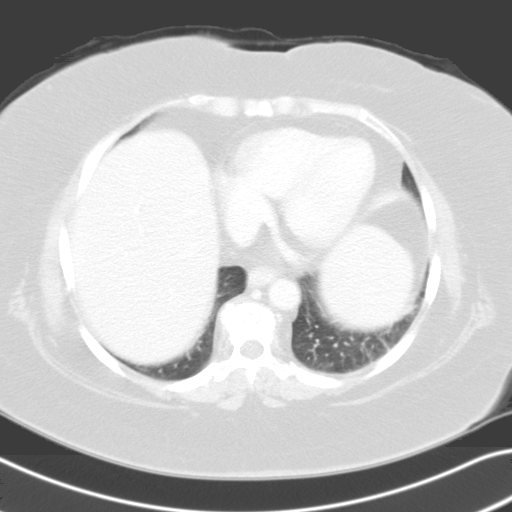
[im 85/91  soft-tissue]
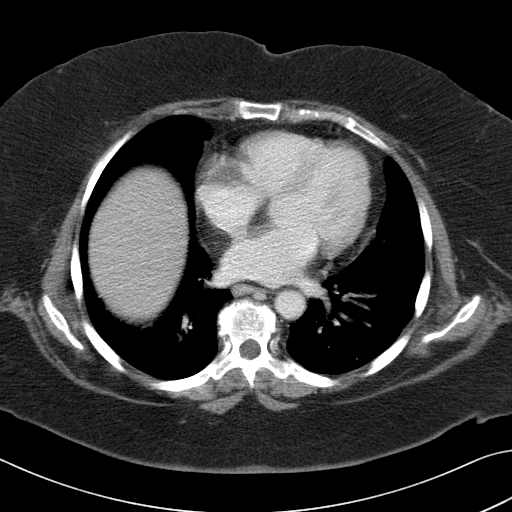
[im 85/91  lung]
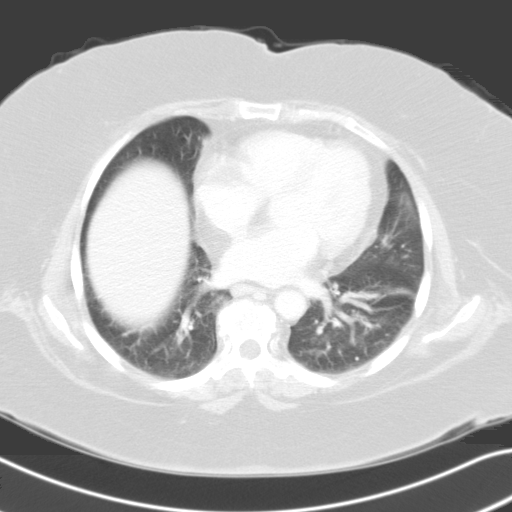

[14 of 32 positions shown; findings below may reference images not displayed]

CT abdomen with contrast:

Multidetector helical CT after 100 ml Smnipaque-MBB IV.
Comparison 06/29/2005. Visualized lung bases clear. Vascular clips in the
gallbladder fossa. Unremarkable liver, spleen, adrenal glands, kidneys,
pancreas, small bowel. Minimal atheromatous calcifications in the abdominal
aorta without aneurysm. Portal vein patent. Degenerative changes in the lumbar
spine. Subcentimeter left paraaortic and aortocaval lymph nodes incidentally
noted, stable since previous exam. No mesenteric or retroperitoneal adenopathy.
IMPRESSION: 1. Negative for acute abdominal process.
2. Degenerative and post operative changes as above.

CT pelvis with contrast:

Normal appendix. Colon is nondilated, unremarkable. No free fluid. Urinary
bladder incompletely distended. No adenopathy.
IMPRESSION: 1. Unremarkable CT pelvis

## 2007-08-14 ENCOUNTER — Ambulatory Visit: Payer: Self-pay | Admitting: Family Medicine

## 2007-08-14 IMAGING — CR DG LUMBAR SPINE COMPLETE 4+V
5 series · 5 of 5 positions shown · non-contrast
Comparison: none

CLINICAL DATA: Change in mental status.  Back pain.  Hip pain.  
LUMBAR SPINE ?5 VIEWS:
Metallic clips right upper quadrant likely due to prior cholecystectomy.  Contrast material in the transverse colon and bladder.  Degenerative changes lower lumbar facet joints mainly bilaterally at L3-4.  Mild disc space narrowing and osteophytic formation at L4-5.  Approximately 2mm anterolisthesis of L3 on L4 secondary to facet arthropathy.  No acute abnormality.

[view not recorded (1 of 5)]
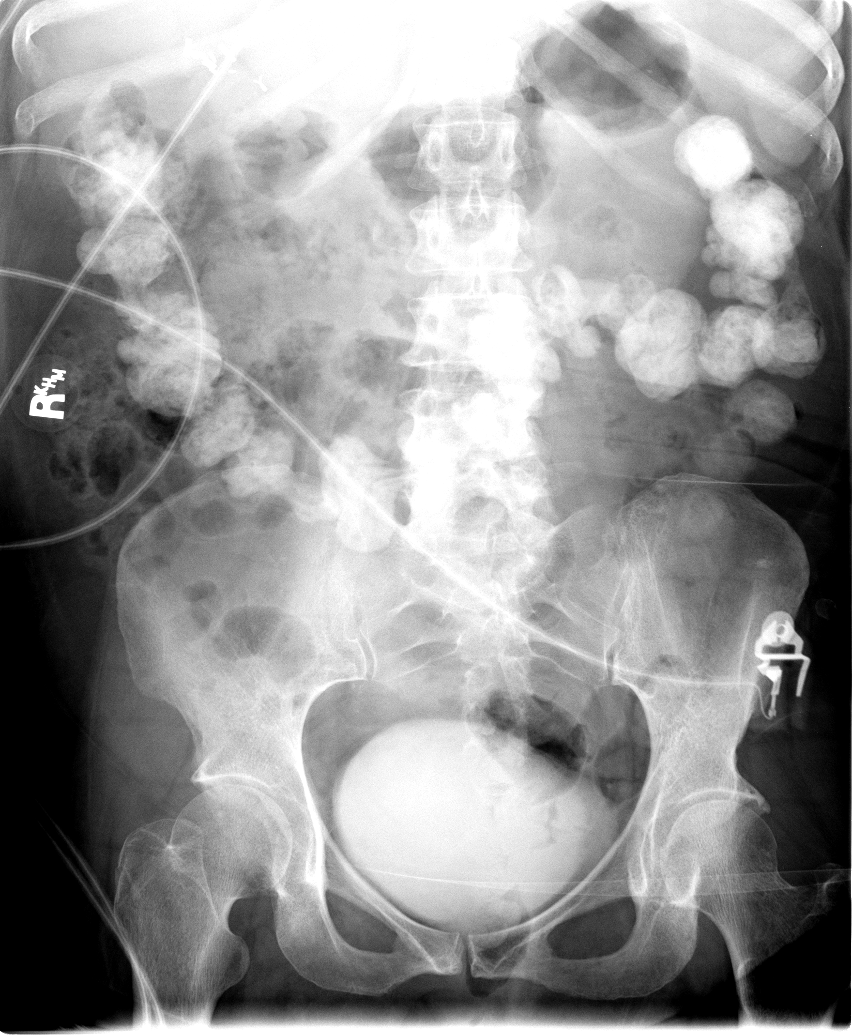

[view not recorded (2 of 5)]
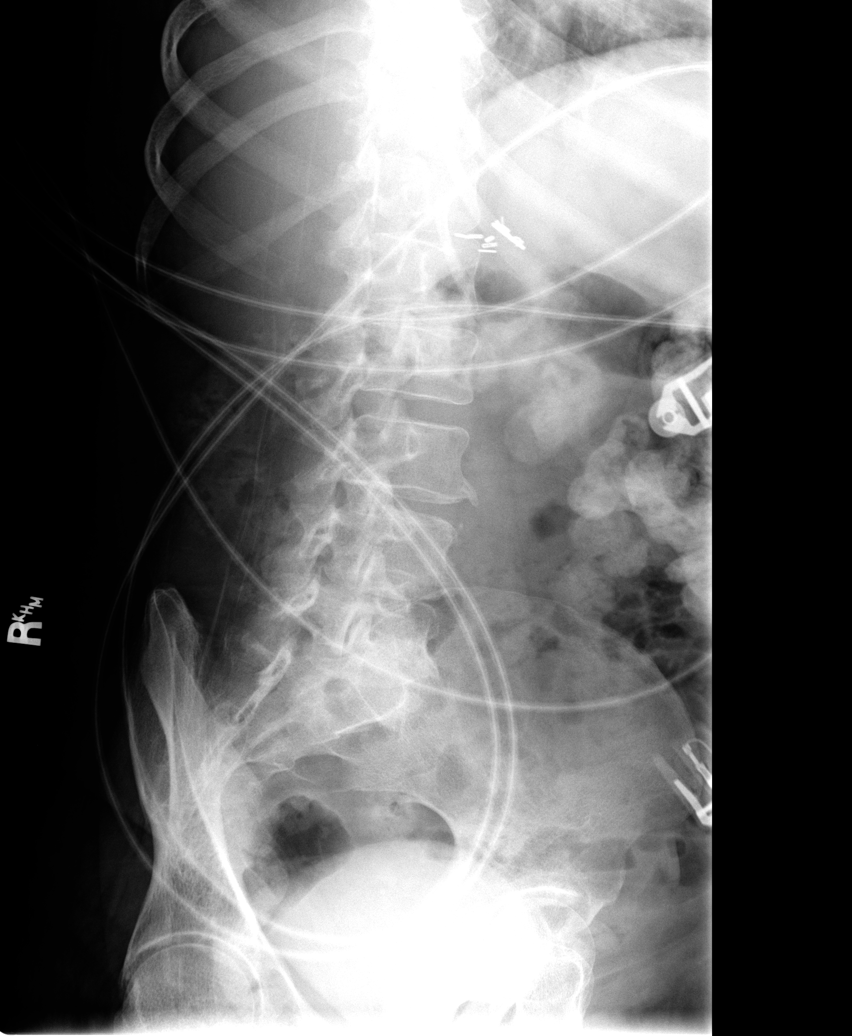

[view not recorded (3 of 5)]
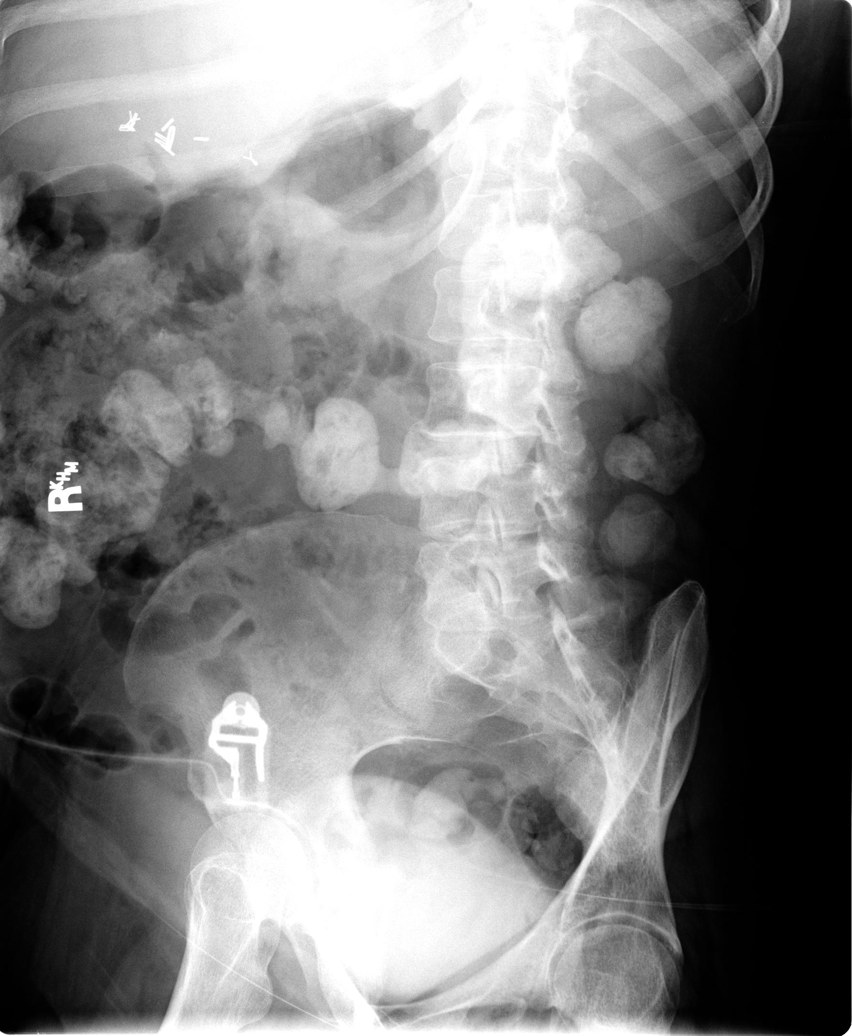

[view not recorded (4 of 5)]
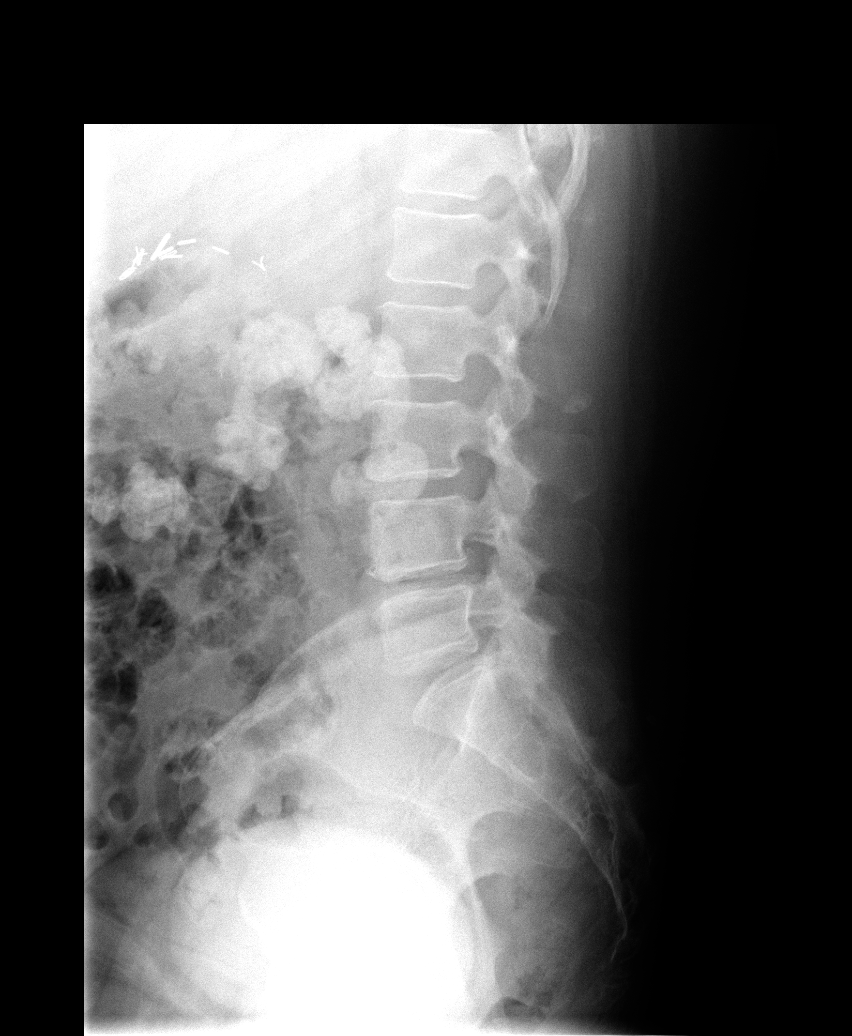

[view not recorded (5 of 5)]
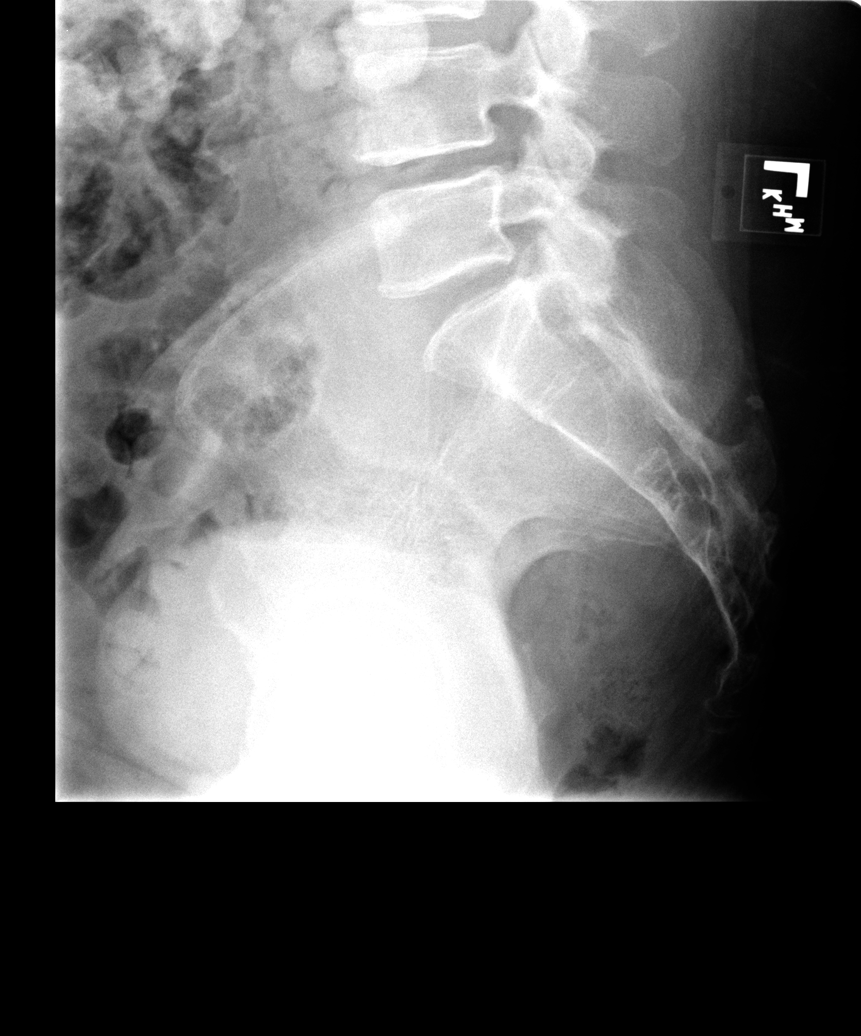

[5 of 5 positions shown; findings below may reference images not displayed]

IMPRESSION: Degenerative spondylotic changes mainly at L4-5.  No acute findings.

## 2007-08-14 IMAGING — CT CT HEAD W/O CM
1 series · 16 of 30 positions shown, 20 images · non-contrast
Comparison: none

HISTORY: Altered level of consciousness

[Series 3010: — · axial · 0.45mm/px · z∈[-714,-579]mm · 16 of 30 slices shown, 20 images]
[im 2/30  brain]
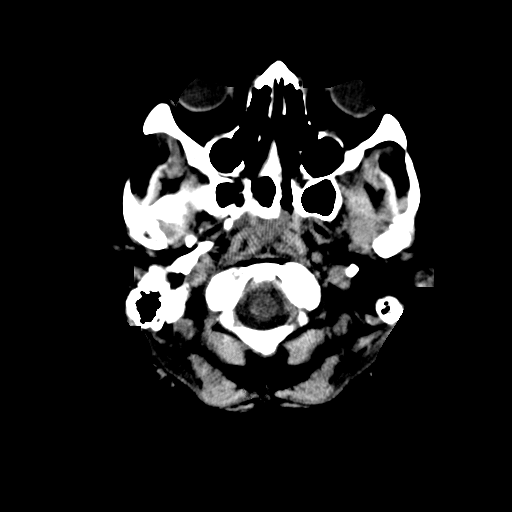
[im 2/30  bone]
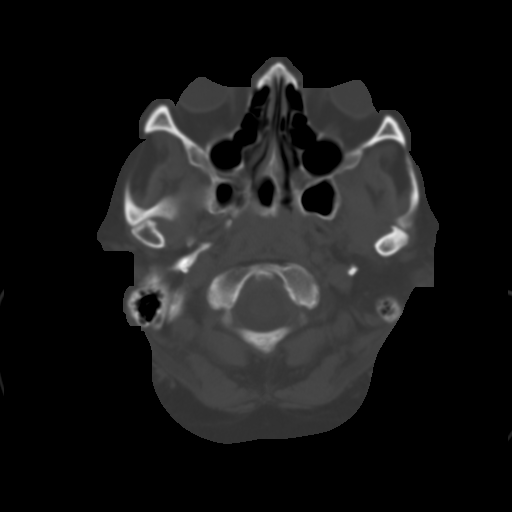
[im 4/30  brain]
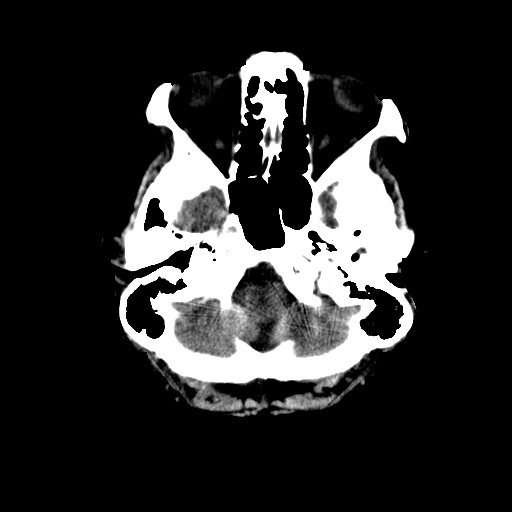
[im 6/30  brain]
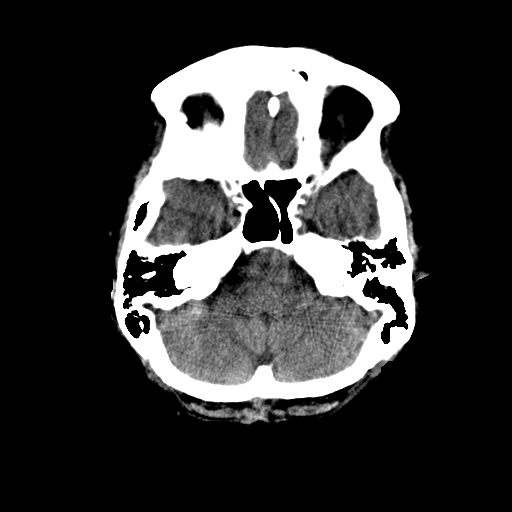
[im 8/30  brain]
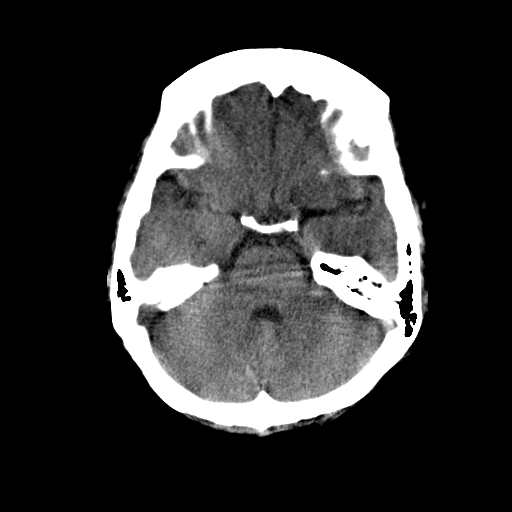
[im 9/30  brain]
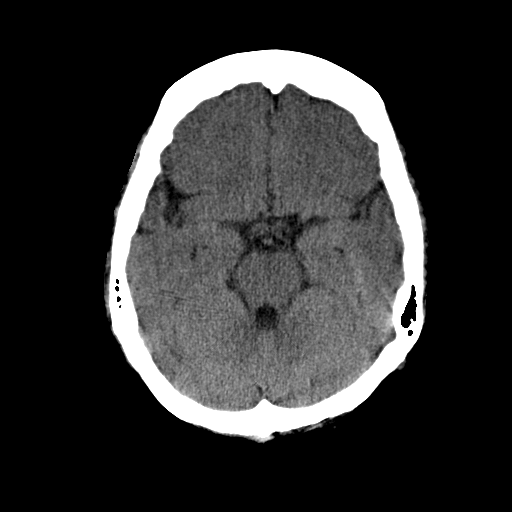
[im 9/30  bone]
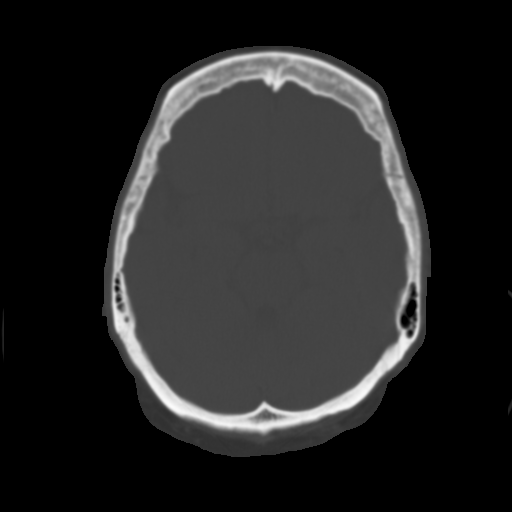
[im 11/30  brain]
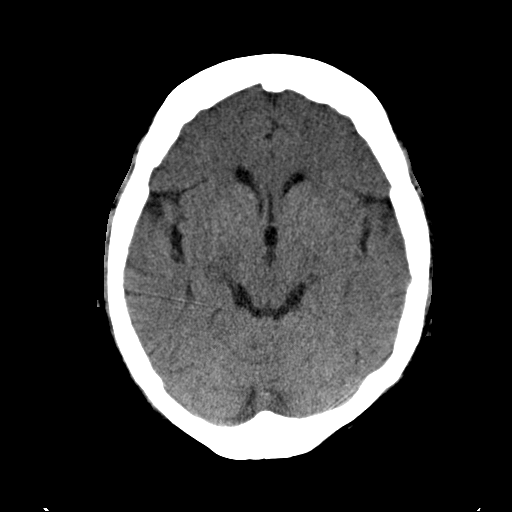
[im 13/30  brain]
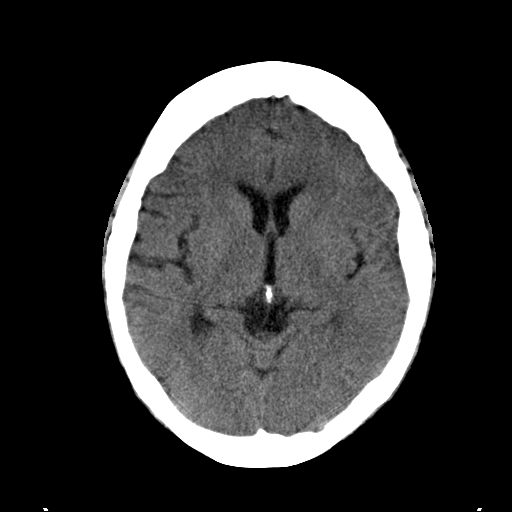
[im 15/30  brain]
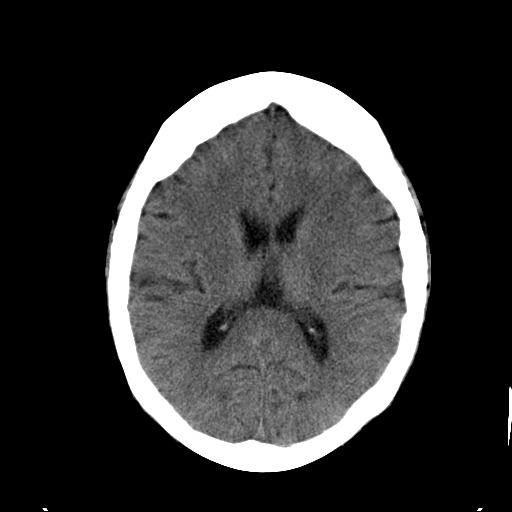
[im 16/30  brain]
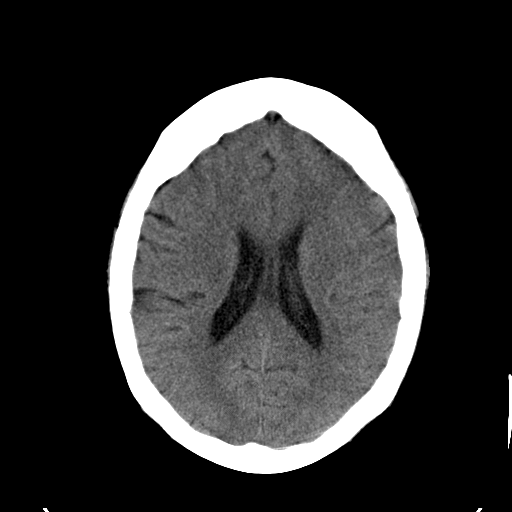
[im 16/30  bone]
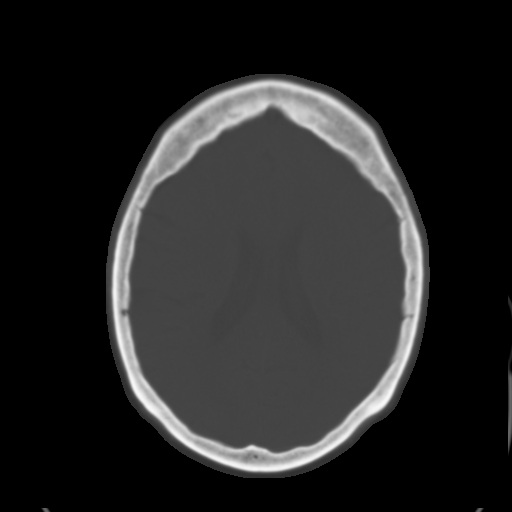
[im 18/30  brain]
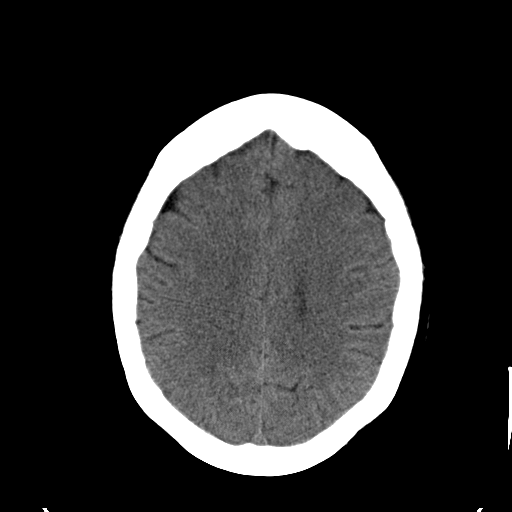
[im 20/30  brain]
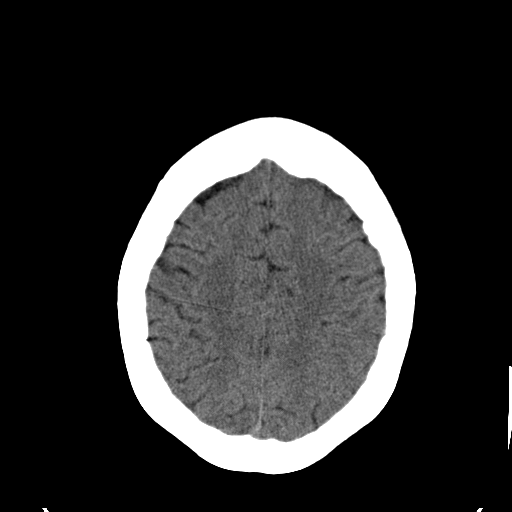
[im 22/30  brain]
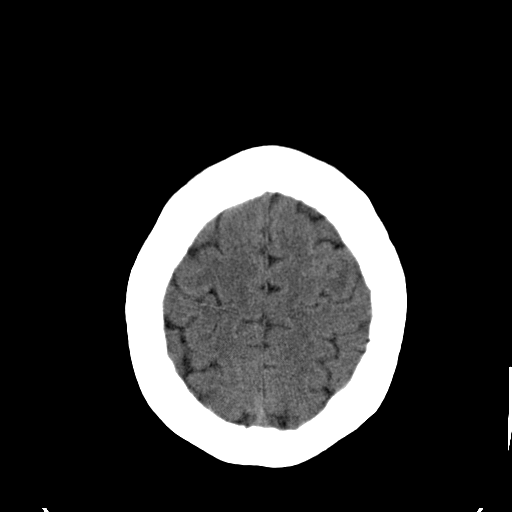
[im 23/30  brain]
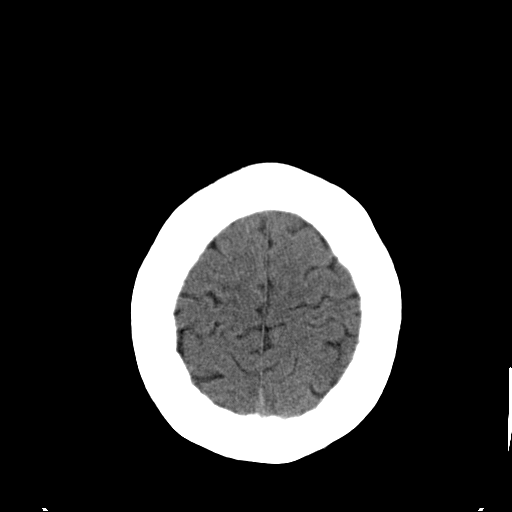
[im 23/30  bone]
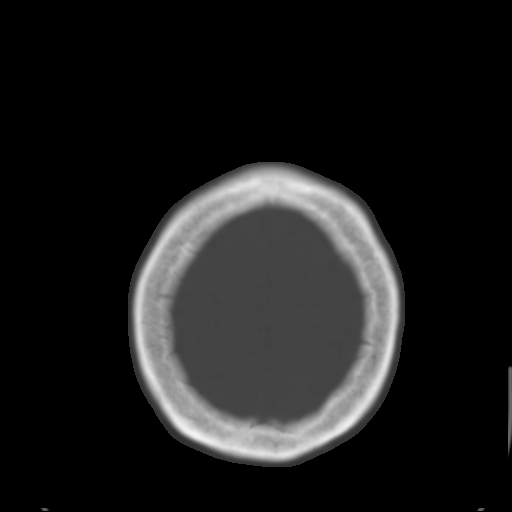
[im 25/30  brain]
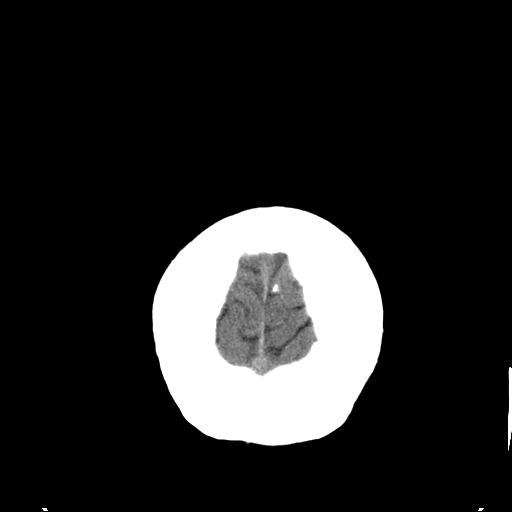
[im 27/30  brain]
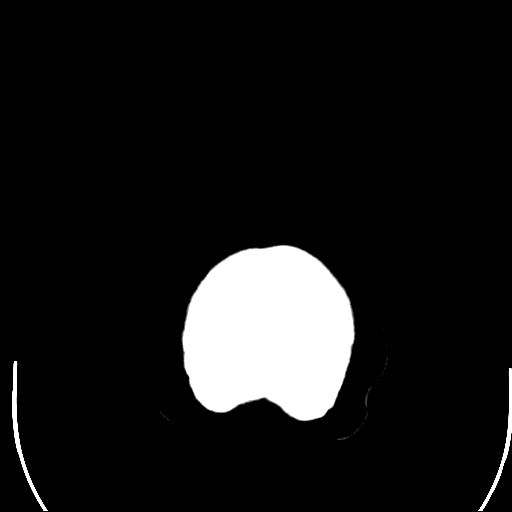
[im 29/30  brain]
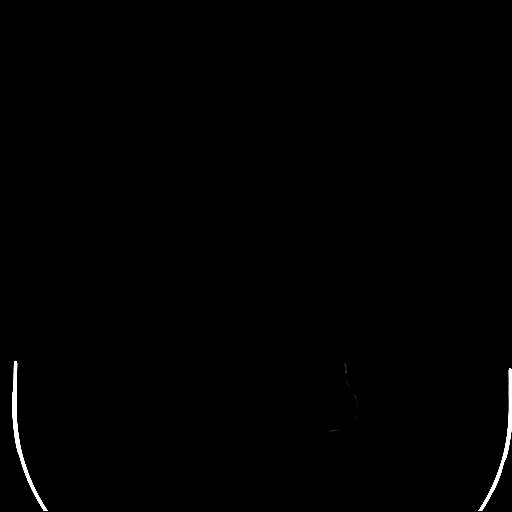

[16 of 30 positions shown; findings below may reference images not displayed]

CT HEAD WITHOUT CONTRAST:

Routine noncontrast CT head compared to 09/29/2005

Scattered beam hardening artifacts at skullbase.
Normal ventricular morphology.
No midline shift or mass-effect.
Normal appearance of brain parenchyma.
No intracranial hemorrhage, mass, or infarct.
Sinuses clear and bones unremarkable.
IMPRESSION: No acute intracranial abnormalities.

## 2007-08-14 IMAGING — CR DG HIP W/ PELVIS BILAT
6 series · 6 of 6 positions shown · non-contrast
Comparison: none

CLINICAL DATA: Altered level of consciousness.  Back pain.  Hip pain.
 BILATERAL HIPS WITH PELVIS ? 6 VIEWS:
 Contrast material into the bladder.  No acute bony abnormality.  Hip joint and sacroiliac joint regions appear unremarkable.

[view not recorded (1 of 6)]
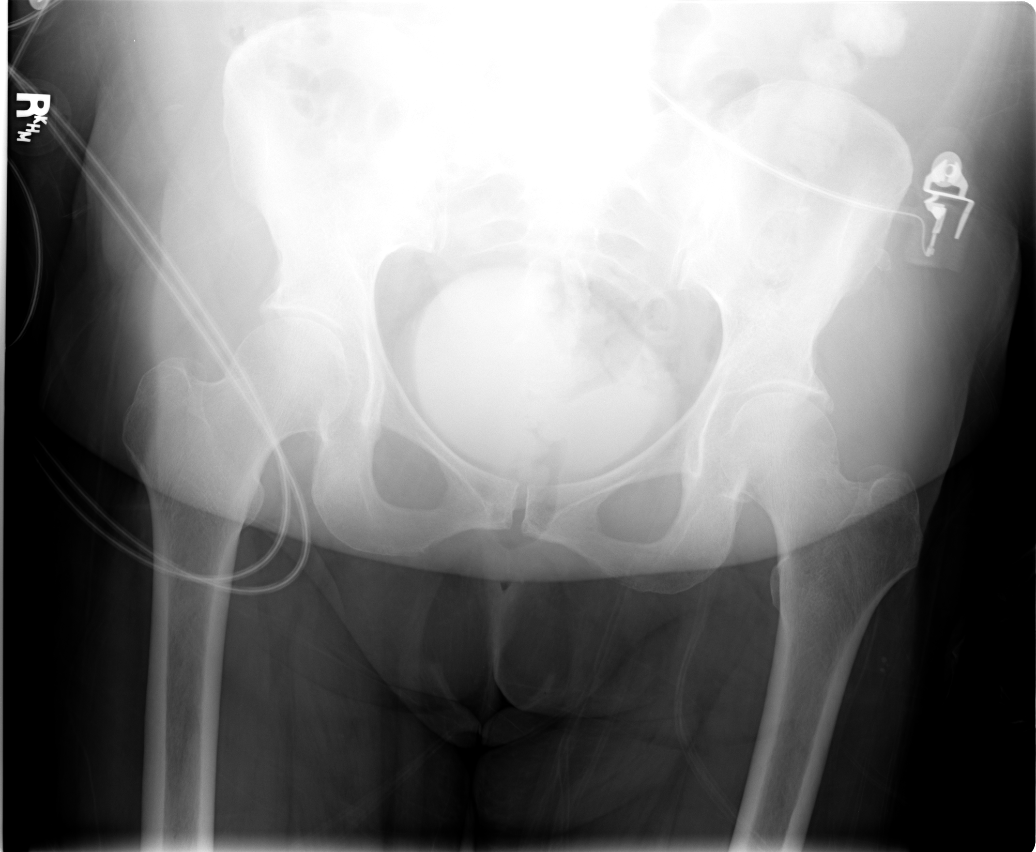

[view not recorded (2 of 6)]
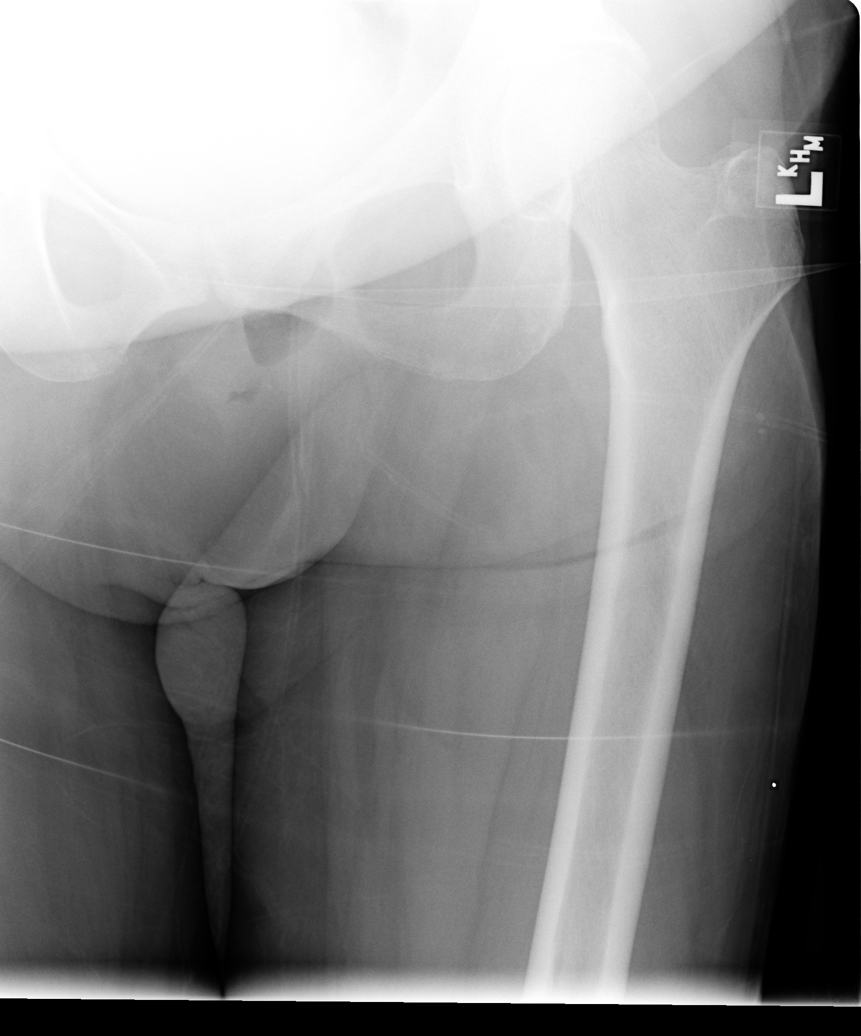

[view not recorded (3 of 6)]
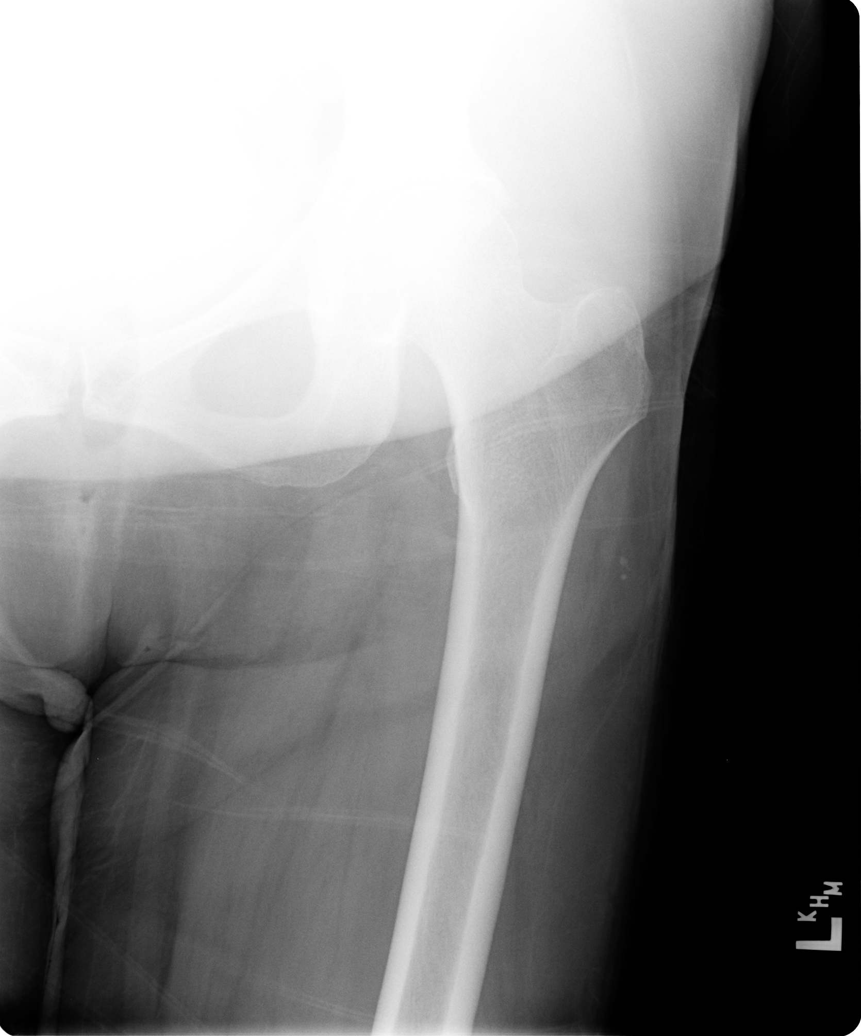

[view not recorded (4 of 6)]
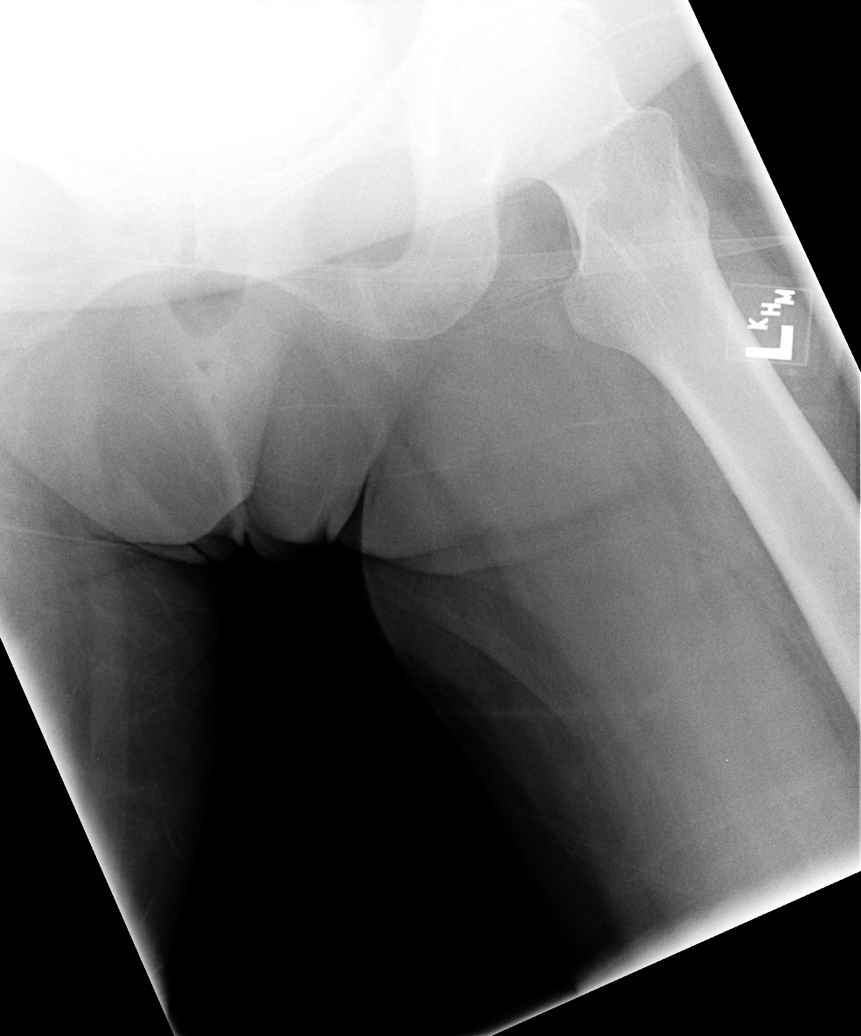

[view not recorded (5 of 6)]
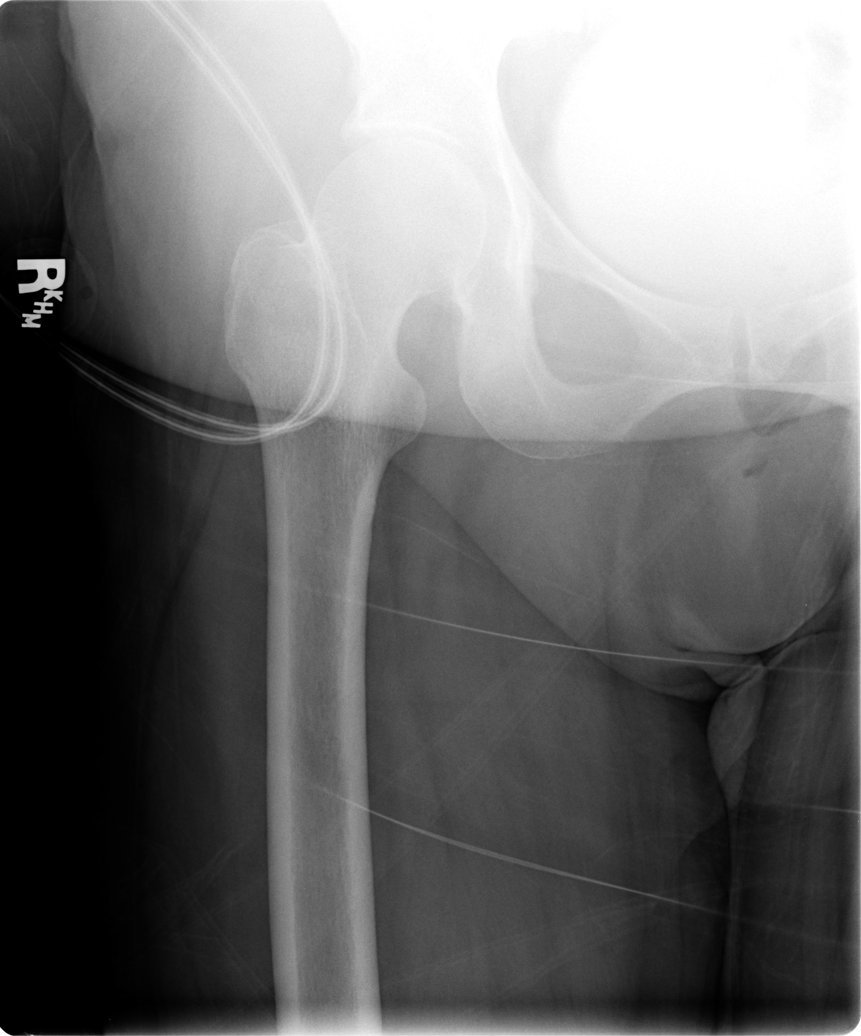

[view not recorded (6 of 6)]
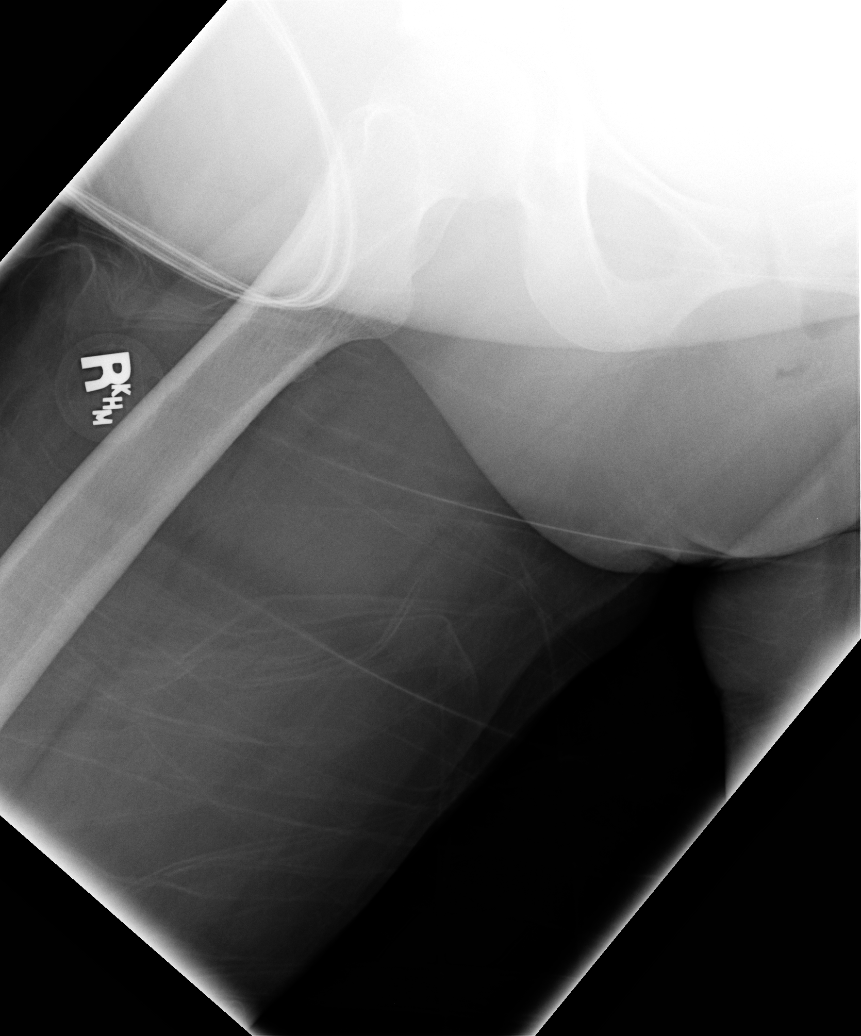

[6 of 6 positions shown; findings below may reference images not displayed]

IMPRESSION: Negative pelvis and hips.

## 2007-08-23 ENCOUNTER — Emergency Department (HOSPITAL_COMMUNITY): Admission: EM | Admit: 2007-08-23 | Discharge: 2007-08-23 | Payer: Self-pay | Admitting: Emergency Medicine

## 2007-08-24 ENCOUNTER — Inpatient Hospital Stay (HOSPITAL_COMMUNITY): Admission: EM | Admit: 2007-08-24 | Discharge: 2007-08-28 | Payer: Self-pay | Admitting: Emergency Medicine

## 2007-08-26 ENCOUNTER — Ambulatory Visit: Payer: Self-pay | Admitting: Internal Medicine

## 2007-08-27 ENCOUNTER — Ambulatory Visit: Payer: Self-pay | Admitting: Gastroenterology

## 2007-09-19 ENCOUNTER — Observation Stay (HOSPITAL_COMMUNITY): Admission: EM | Admit: 2007-09-19 | Discharge: 2007-09-20 | Payer: Self-pay | Admitting: Emergency Medicine

## 2007-09-19 ENCOUNTER — Ambulatory Visit: Payer: Self-pay | Admitting: Family Medicine

## 2007-09-24 ENCOUNTER — Encounter: Payer: Self-pay | Admitting: Family Medicine

## 2007-09-24 ENCOUNTER — Other Ambulatory Visit: Admission: RE | Admit: 2007-09-24 | Discharge: 2007-09-24 | Payer: Self-pay | Admitting: Family Medicine

## 2007-09-24 ENCOUNTER — Ambulatory Visit: Payer: Self-pay | Admitting: Family Medicine

## 2007-09-24 LAB — CONVERTED CEMR LAB: Pap Smear: NORMAL

## 2007-09-25 ENCOUNTER — Encounter: Payer: Self-pay | Admitting: Family Medicine

## 2007-09-25 LAB — CONVERTED CEMR LAB: TSH: 0.992 microintl units/mL (ref 0.350–4.50)

## 2007-09-26 ENCOUNTER — Ambulatory Visit: Payer: Self-pay | Admitting: Family Medicine

## 2007-09-30 ENCOUNTER — Encounter: Payer: Self-pay | Admitting: Family Medicine

## 2007-10-01 ENCOUNTER — Ambulatory Visit (HOSPITAL_COMMUNITY): Admission: RE | Admit: 2007-10-01 | Discharge: 2007-10-01 | Payer: Self-pay | Admitting: Internal Medicine

## 2007-10-03 ENCOUNTER — Telehealth: Payer: Self-pay | Admitting: Family Medicine

## 2007-10-10 ENCOUNTER — Telehealth: Payer: Self-pay | Admitting: Family Medicine

## 2007-10-15 ENCOUNTER — Telehealth: Payer: Self-pay | Admitting: Family Medicine

## 2007-10-16 ENCOUNTER — Encounter: Payer: Self-pay | Admitting: Family Medicine

## 2007-10-16 ENCOUNTER — Ambulatory Visit: Payer: Self-pay | Admitting: Internal Medicine

## 2007-10-25 ENCOUNTER — Ambulatory Visit (HOSPITAL_COMMUNITY): Admission: RE | Admit: 2007-10-25 | Discharge: 2007-10-25 | Payer: Self-pay | Admitting: Internal Medicine

## 2007-10-30 ENCOUNTER — Ambulatory Visit: Payer: Self-pay | Admitting: Family Medicine

## 2007-11-01 ENCOUNTER — Encounter: Payer: Self-pay | Admitting: Family Medicine

## 2007-11-03 DIAGNOSIS — G4700 Insomnia, unspecified: Secondary | ICD-10-CM

## 2007-11-03 DIAGNOSIS — F060 Psychotic disorder with hallucinations due to known physiological condition: Secondary | ICD-10-CM

## 2007-11-04 ENCOUNTER — Ambulatory Visit: Payer: Self-pay | Admitting: Gastroenterology

## 2007-11-04 ENCOUNTER — Encounter: Payer: Self-pay | Admitting: Family Medicine

## 2007-11-04 ENCOUNTER — Telehealth: Payer: Self-pay | Admitting: Family Medicine

## 2007-11-05 ENCOUNTER — Encounter: Payer: Self-pay | Admitting: Family Medicine

## 2007-11-06 ENCOUNTER — Encounter: Payer: Self-pay | Admitting: Family Medicine

## 2007-11-13 ENCOUNTER — Telehealth: Payer: Self-pay | Admitting: Family Medicine

## 2007-11-19 ENCOUNTER — Ambulatory Visit (HOSPITAL_COMMUNITY): Admission: RE | Admit: 2007-11-19 | Discharge: 2007-11-19 | Payer: Self-pay | Admitting: Internal Medicine

## 2007-11-19 ENCOUNTER — Encounter: Payer: Self-pay | Admitting: Family Medicine

## 2007-11-19 ENCOUNTER — Ambulatory Visit: Payer: Self-pay | Admitting: Internal Medicine

## 2007-11-20 ENCOUNTER — Telehealth: Payer: Self-pay | Admitting: Family Medicine

## 2007-11-21 ENCOUNTER — Encounter: Payer: Self-pay | Admitting: Family Medicine

## 2007-11-21 DIAGNOSIS — E669 Obesity, unspecified: Secondary | ICD-10-CM

## 2007-11-22 ENCOUNTER — Encounter: Payer: Self-pay | Admitting: Family Medicine

## 2007-11-24 IMAGING — CR DG CHEST 1V PORT
1 series · 1 of 1 positions shown · non-contrast
Comparison: none

CLINICAL DATA: Altered level of consciousness

Portable chest at 4431:
Comparison 05/03/2005. Vascular clips right upper abdomen. The heart size and
mediastinal contours are within normal limits.  Both lungs are clear.  The
visualized skeletal structures are unremarkable.

[view not recorded]
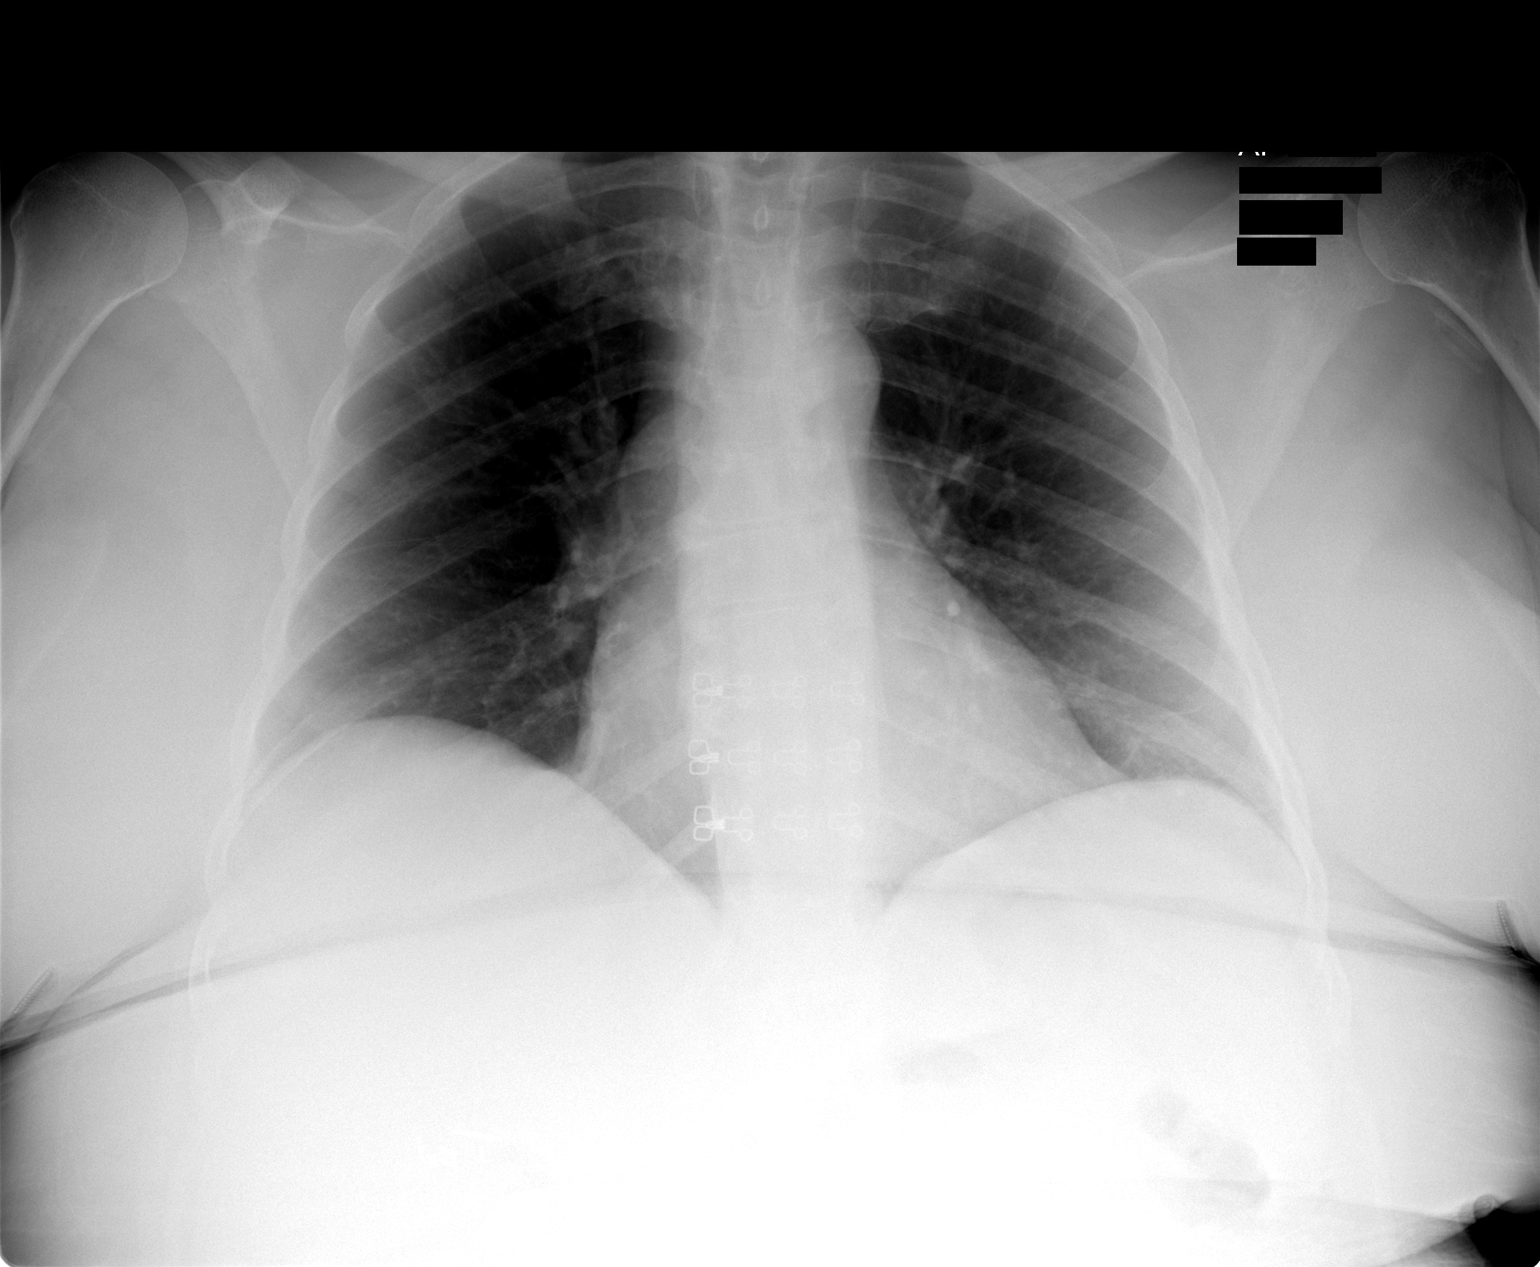

[1 of 1 positions shown; findings below may reference images not displayed]

IMPRESSION: 1. No active cardiopulmonary disease.

## 2007-11-24 IMAGING — CT CT HEAD W/O CM
1 series · 16 of 30 positions shown, 20 images · non-contrast
Comparison: none

CLINICAL DATA: Altered level of consciousness

[Series 2: headseq 4.8 h37s · axial · 0.46mm/px · z∈[+114,+249]mm · 16 of 30 slices shown, 20 images]
[im 2/30  brain]
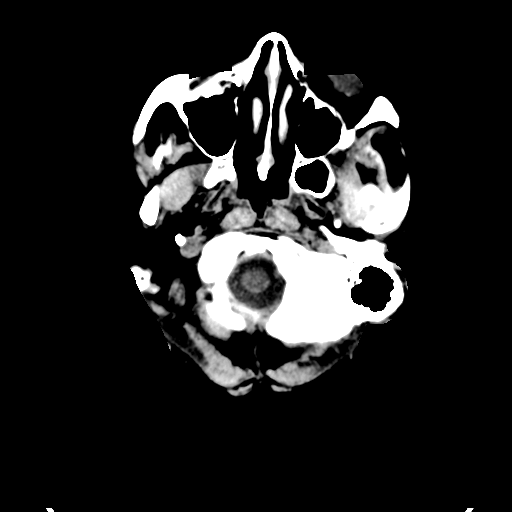
[im 2/30  bone]
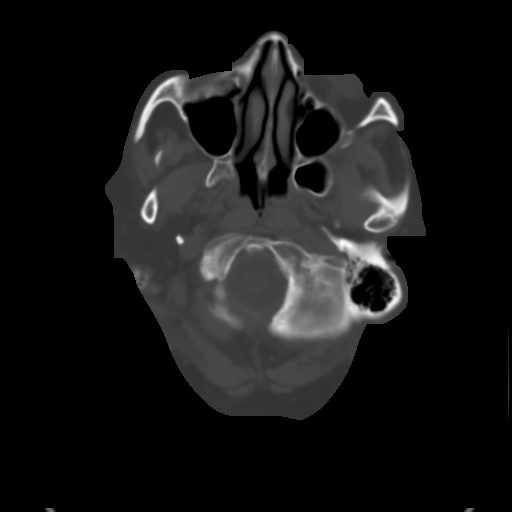
[im 4/30  brain]
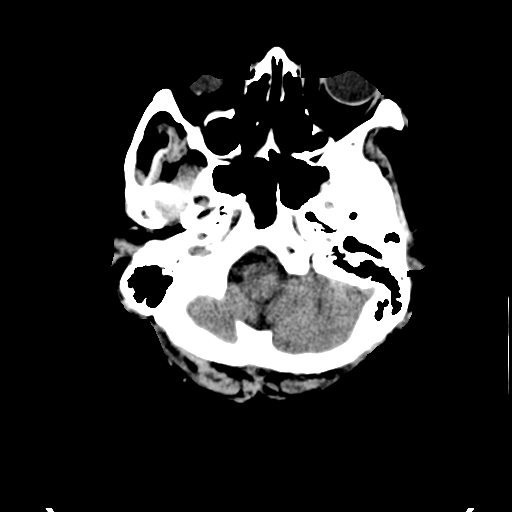
[im 6/30  brain]
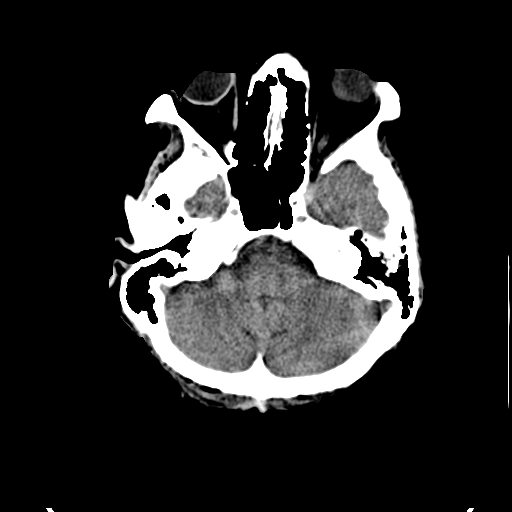
[im 8/30  brain]
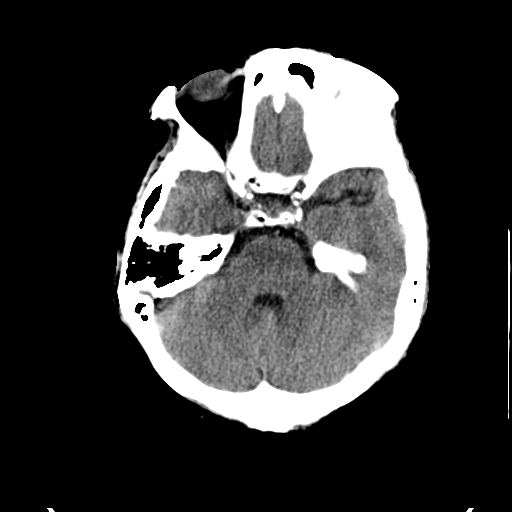
[im 9/30  brain]
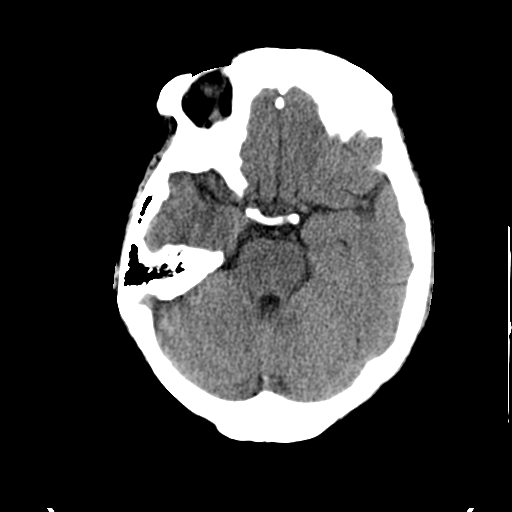
[im 9/30  bone]
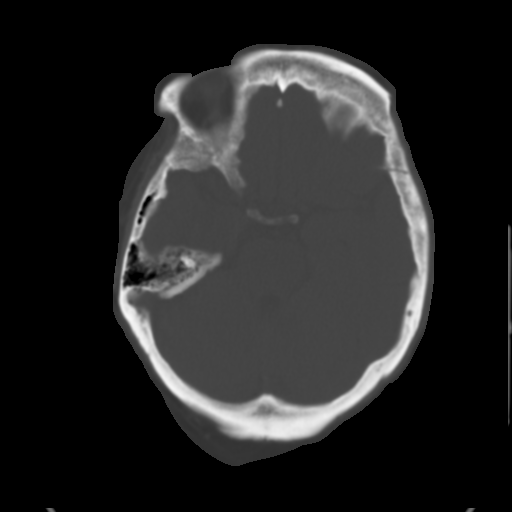
[im 11/30  brain]
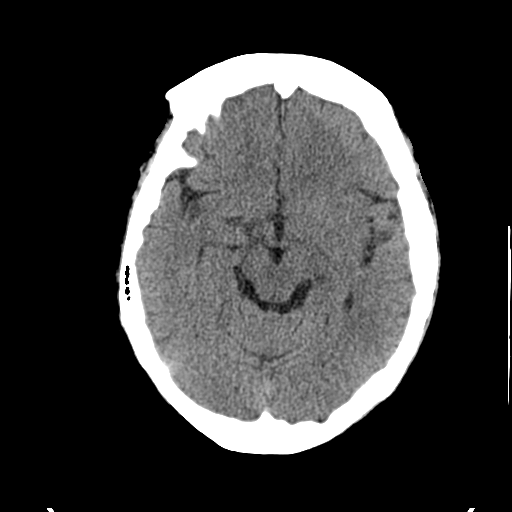
[im 13/30  brain]
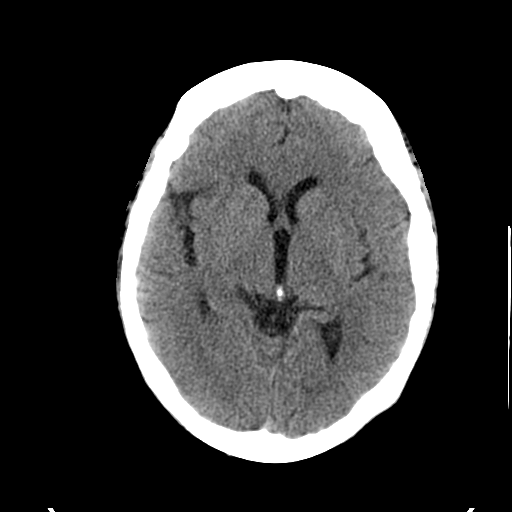
[im 15/30  brain]
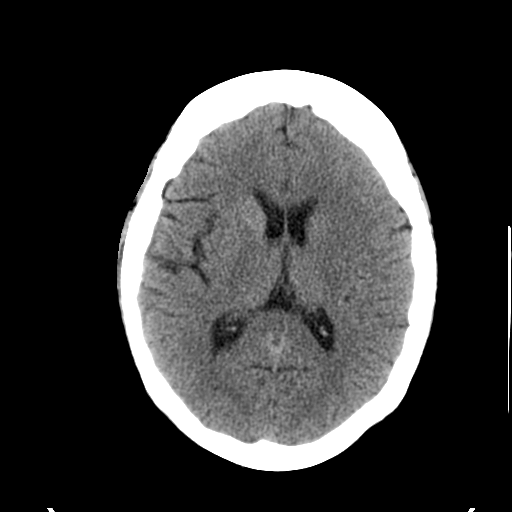
[im 16/30  brain]
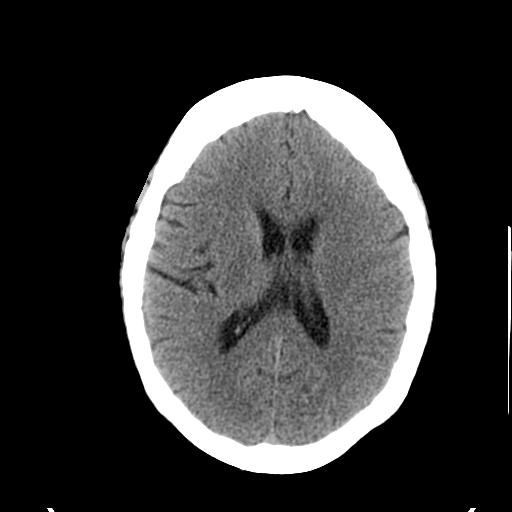
[im 16/30  bone]
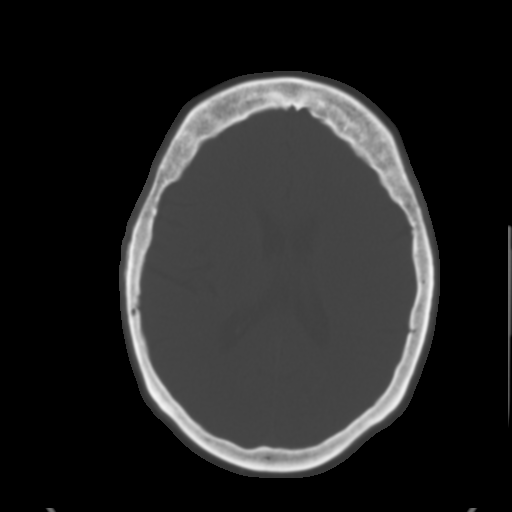
[im 18/30  brain]
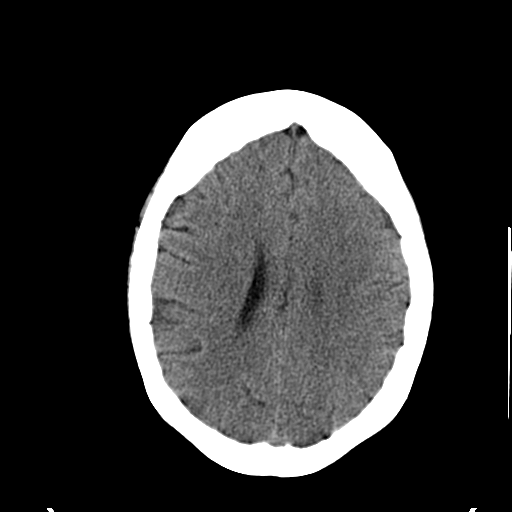
[im 20/30  brain]
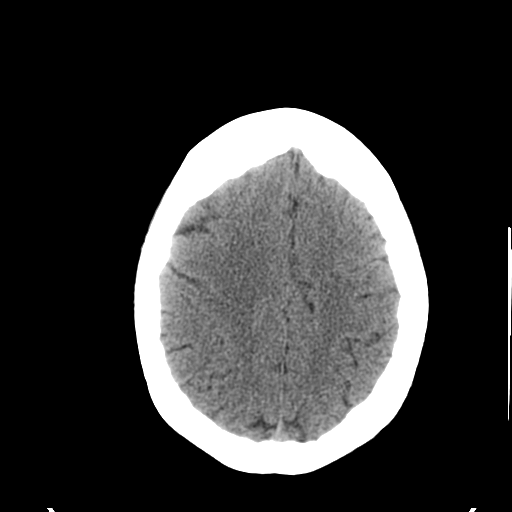
[im 22/30  brain]
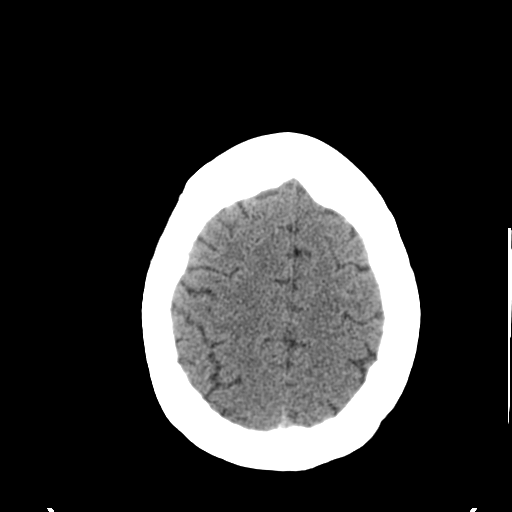
[im 23/30  brain]
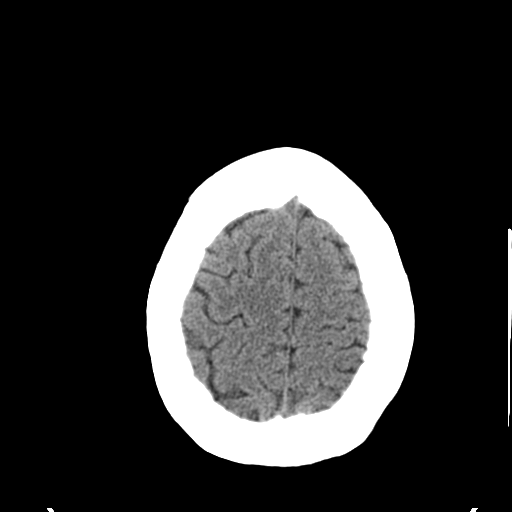
[im 23/30  bone]
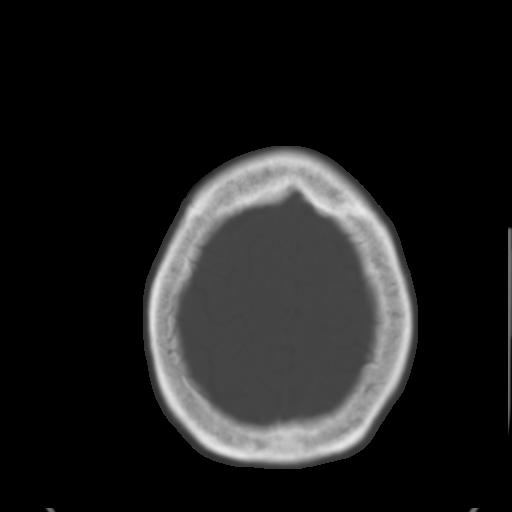
[im 25/30  brain]
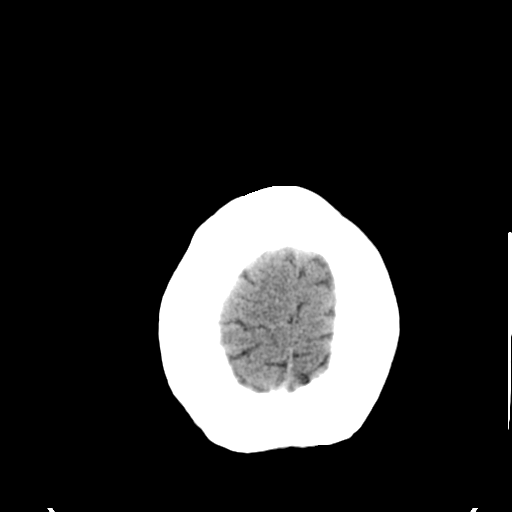
[im 27/30  brain]
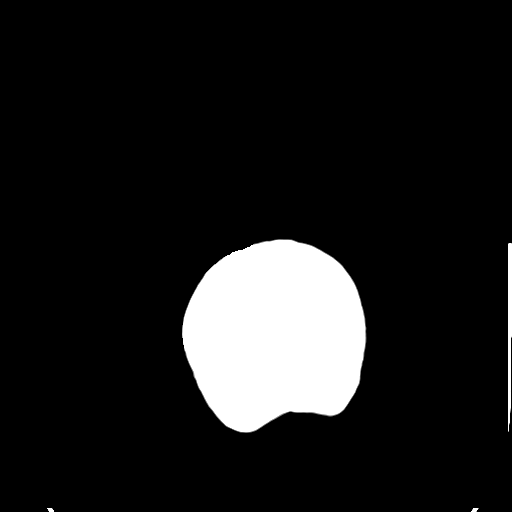
[im 29/30  brain]
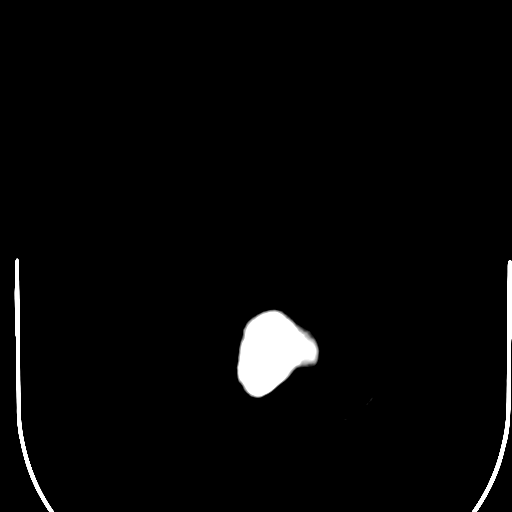

[16 of 30 positions shown; findings below may reference images not displayed]

CT head without contrast:

Comparison 01/19/2006.  There is no evidence of acute intracranial hemorrhage,
brain edema, mass,  mass effect, or midline shift. Acute infarct may be
inapparent on noncontrast CT.  No other intra-axial abnormalities are seen, and
the ventricles and sulci are within normal limits in size and symmetry.   No
abnormal extra-axial fluid collections or masses are identified.  No significant
calvarial abnormality.
IMPRESSION: 1. Negative non-contrast head CT.

## 2007-11-25 ENCOUNTER — Telehealth: Payer: Self-pay | Admitting: Family Medicine

## 2007-11-25 IMAGING — US US ABDOMEN COMPLETE
1 series · 14 of 25 positions shown · non-contrast
Comparison: none

CLINICAL DATA: Altered mental status, acute renal failure, and abdominal pain. Elevated creatinine. Cholecystectomy.
COMPLETE ABDOMEN ULTRASOUND:
TECHNIQUE: Complete abdominal ultrasound examination was performed including evaluation of the liver, gallbladder, bile ducts, pancreas, kidneys, spleen, IVC, and abdominal aorta.

[Series 1: unknown · 0.37mm/px · 14 of 42 slices shown]
[im 1/42]
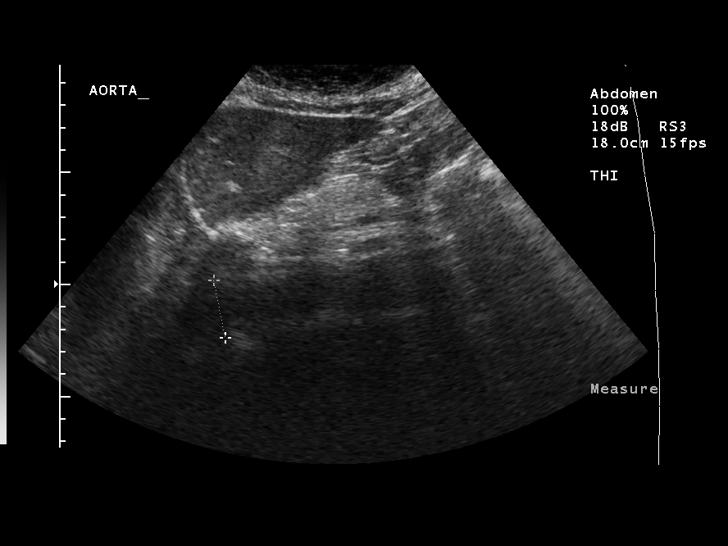
[im 4/42]
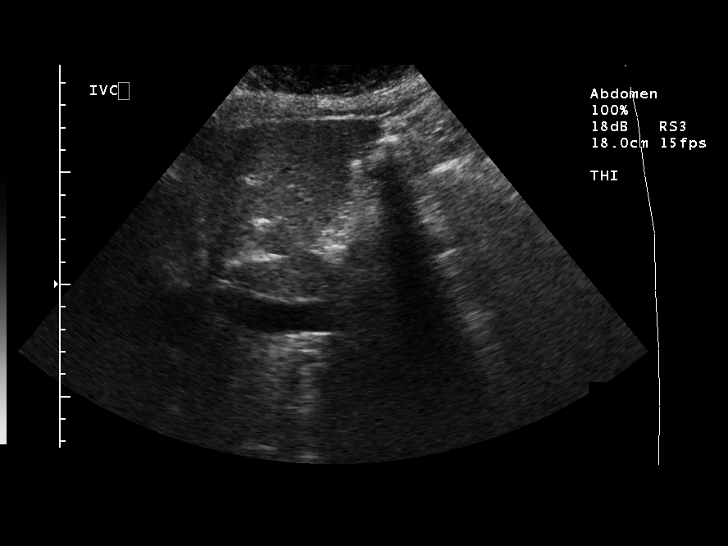
[im 7/42]
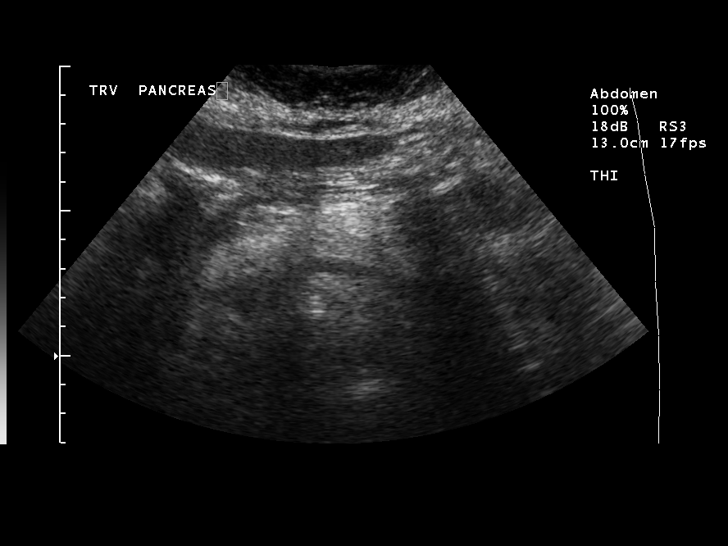
[im 11/42]
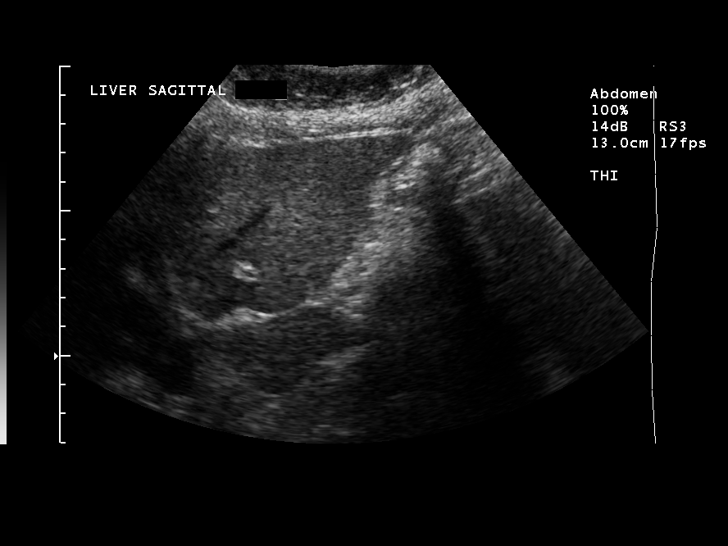
[im 14/42]
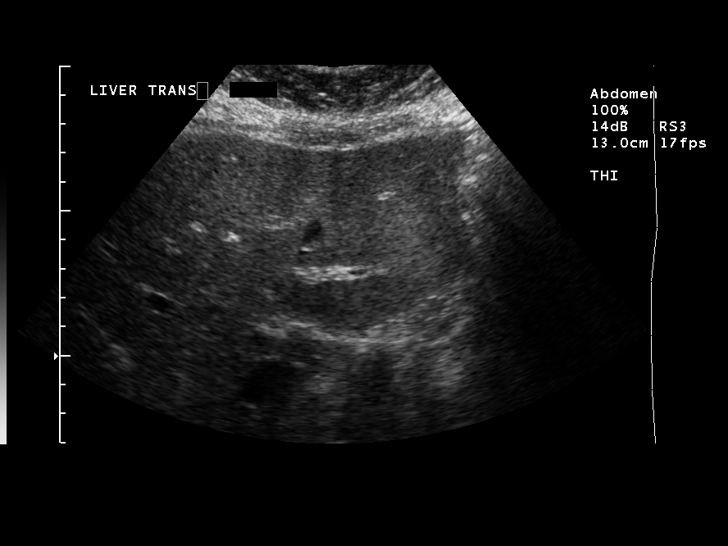
[im 16/42]
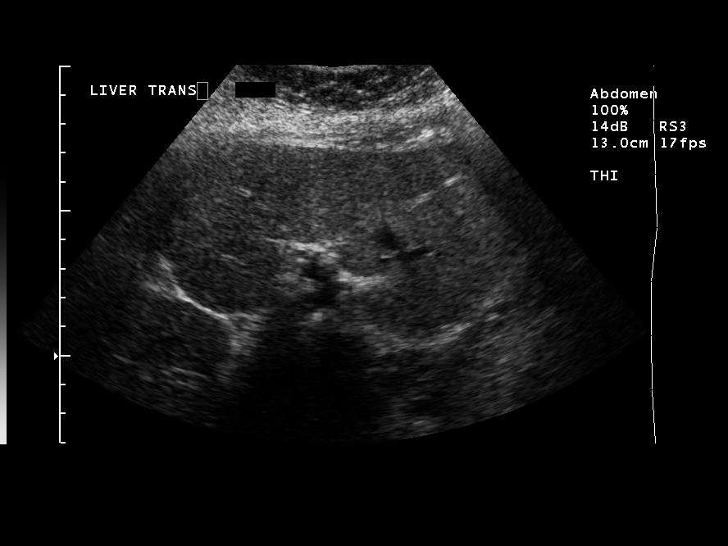
[im 19/42]
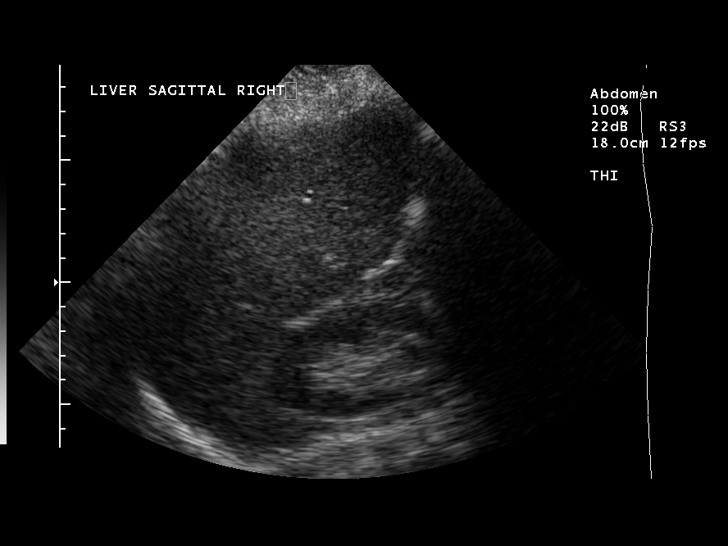
[im 23/42]
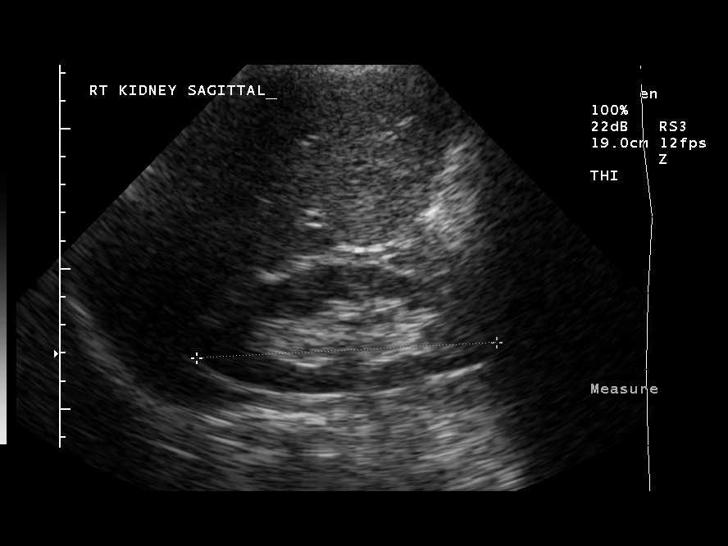
[im 26/42]
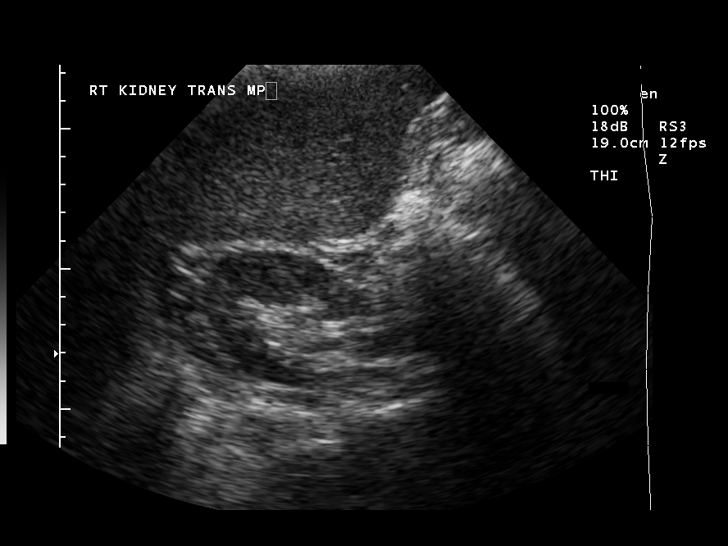
[im 28/42]
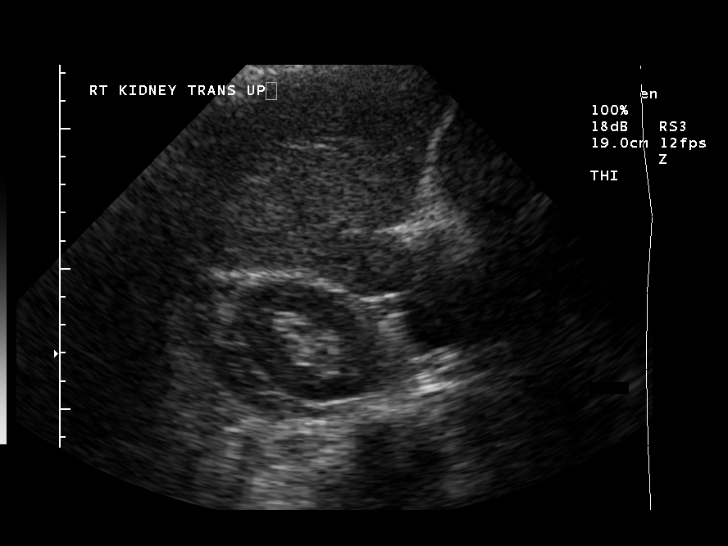
[im 31/42]
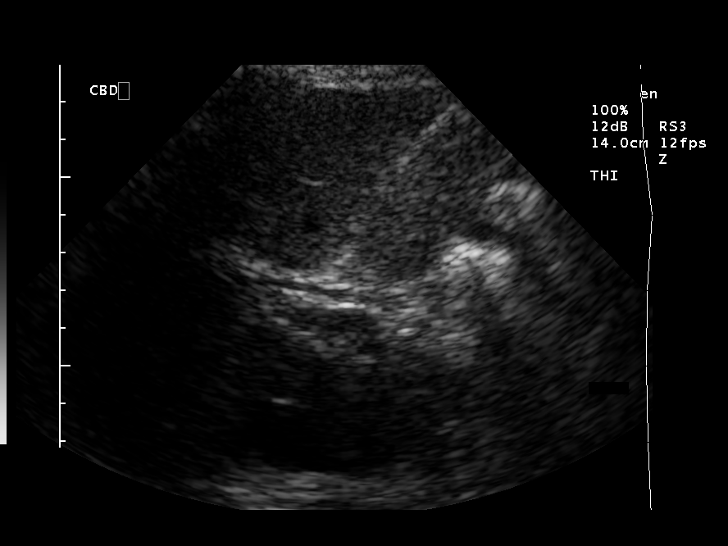
[im 35/42]
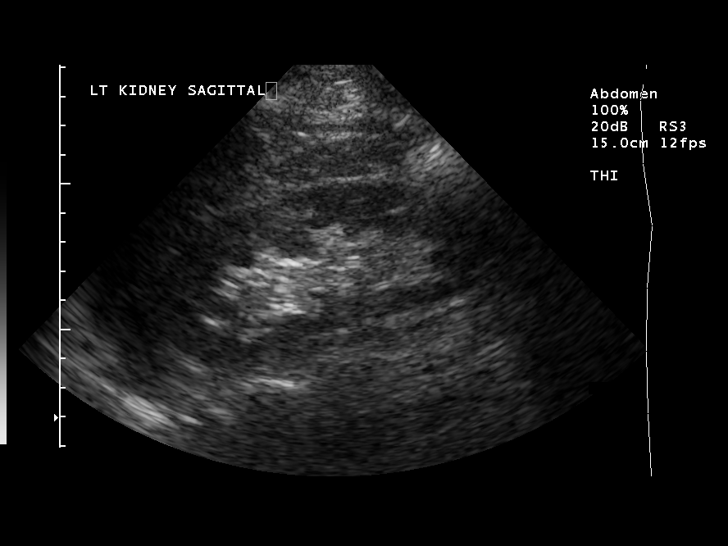
[im 38/42]
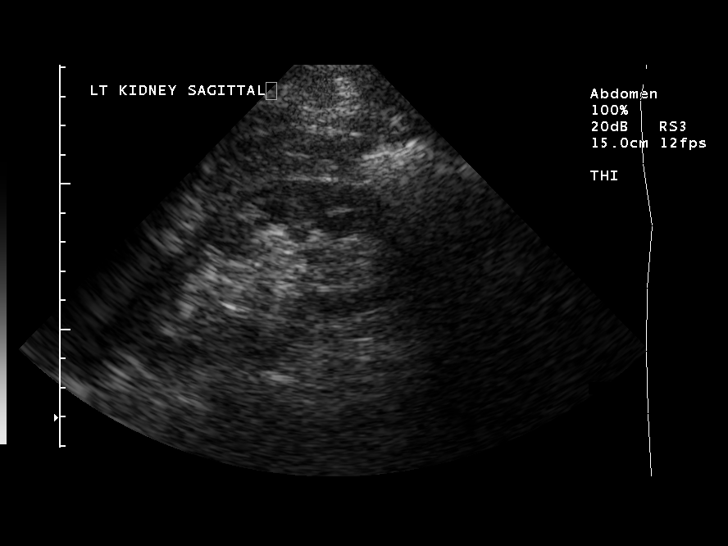
[im 42/42]
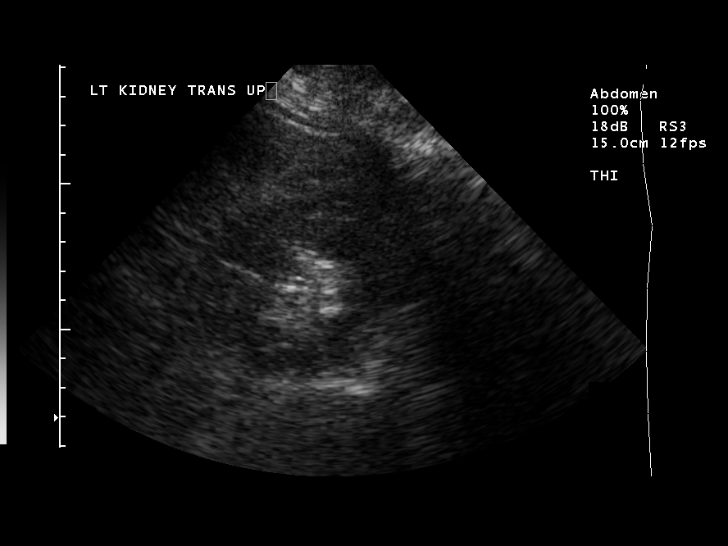

[14 of 25 positions shown; findings below may reference images not displayed]

FINDINGS: The gallbladder is surgically absent. The common duct measures 4.5 mm, which is within normal limits. No hepatic, pancreatic, splenic, or renal abnormality. The spleen measures 11.2 cm in length.  The right and left kidneys measure 10.7 cm and 12.2 cm in length, respectively.  There appears to be some thinning of the cortex of the right kidney compared to the left kidney and there is some discrepancy in renal size. Patent IVC. Abdominal aorta maximum diameter is 2.6 cm.
IMPRESSION: Cholecystectomy. No acute abdominal process.  No hydronephrosis. Slight discrepancy of renal size with the right kidney being smaller than the left.  Is there any clinical concern for right renal artery stenosis, i.e., question renovascular hypertension.

## 2007-11-26 IMAGING — CT CT ABDOMEN W/ CM
2 of 4 series · 16 of 46 positions shown, 18 images · IV contrast (omnipaque)
Comparison: 01/18/06.

CLINICAL DATA: 55-year-old female with abdominal pain, nausea and vomiting.  No diarrhea. 
ABDOMEN CT WITH CONTRAST:
TECHNIQUE: Multidetector CT imaging of the abdomen was performed following the standard protocol during bolus administration of intravenous contrast.
Contrast:  100 cc Omnipaque 300.
TECHNIQUE: Multidetector CT imaging of the pelvis was performed following the standard protocol during bolus administration of intravenous contrast.

[Series 2: abd_pel 5.0 b40f · axial · 0.66mm/px · z∈[-392,-2]mm · 13 of 88 slices shown, 15 images]
[im 5/88  soft-tissue]
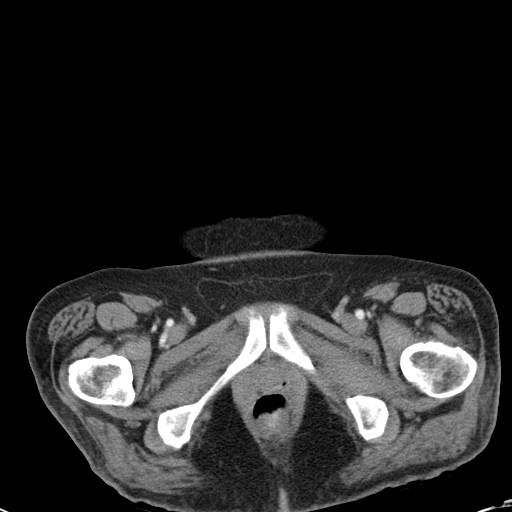
[im 5/88  bone]
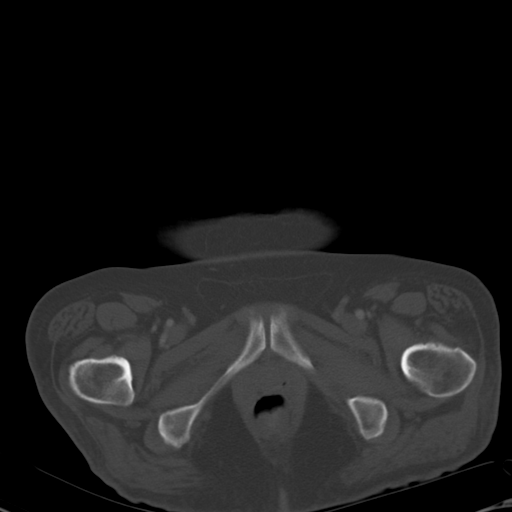
[im 13/88  soft-tissue]
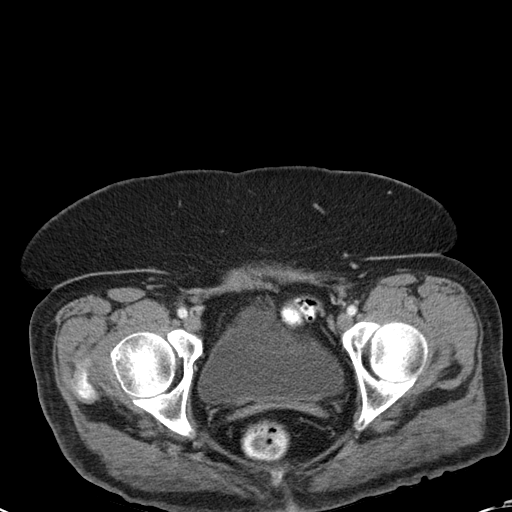
[im 17/88  soft-tissue]
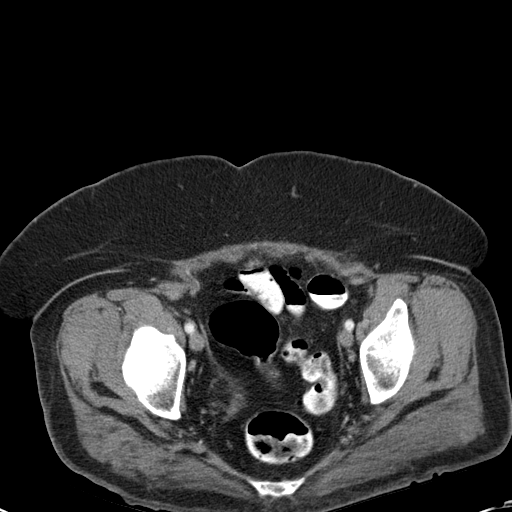
[im 25/88  soft-tissue]
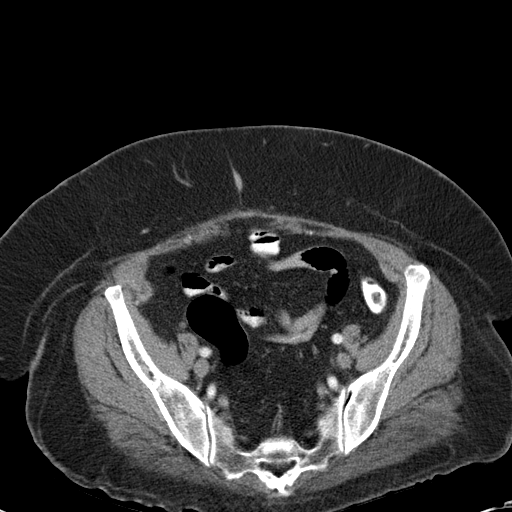
[im 30/88  soft-tissue]
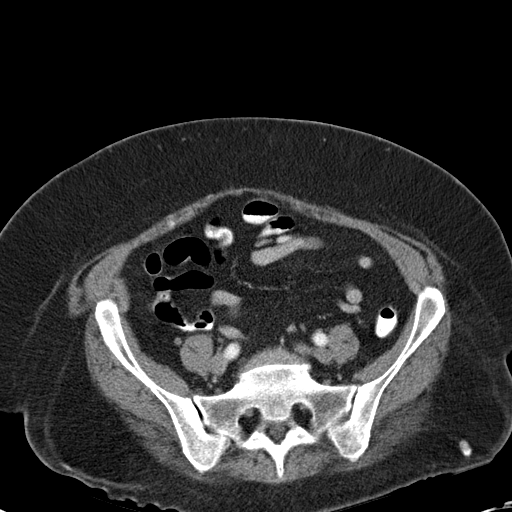
[im 38/88  soft-tissue]
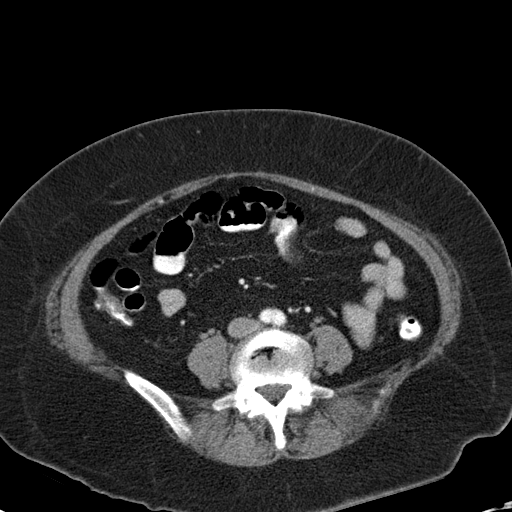
[im 46/88  soft-tissue]
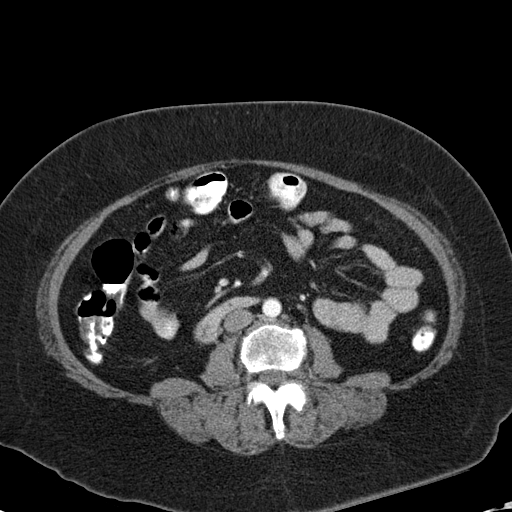
[im 50/88  soft-tissue]
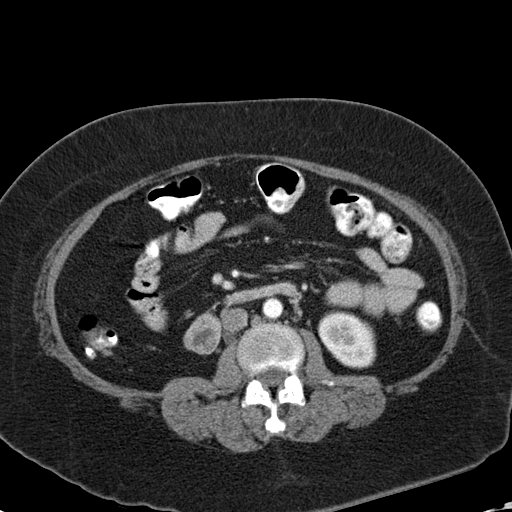
[im 59/88  soft-tissue]
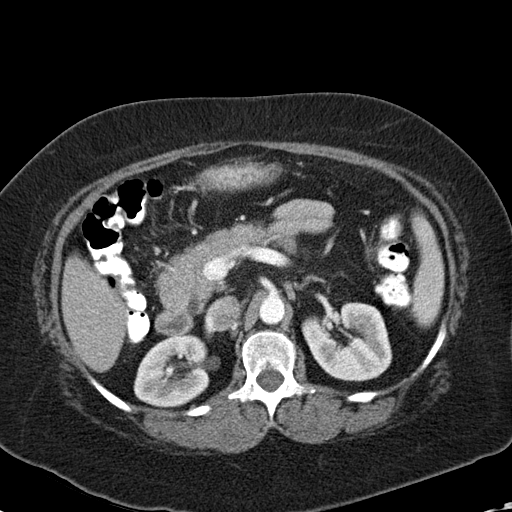
[im 59/88  bone]
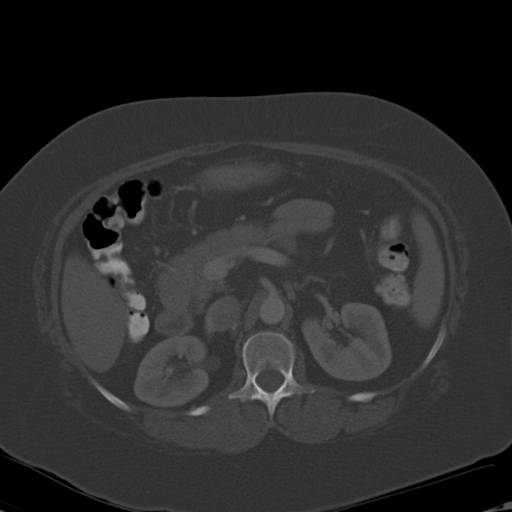
[im 63/88  soft-tissue]
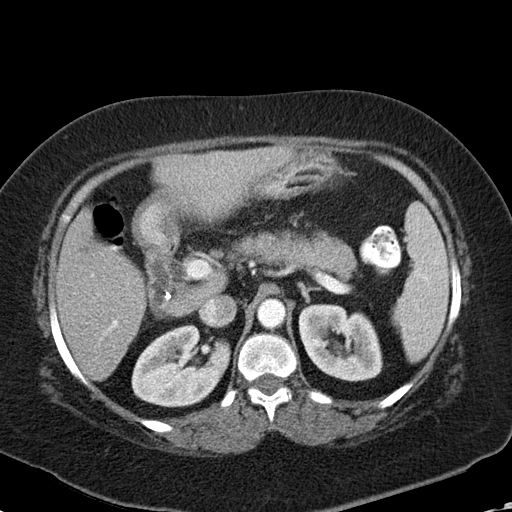
[im 71/88  soft-tissue]
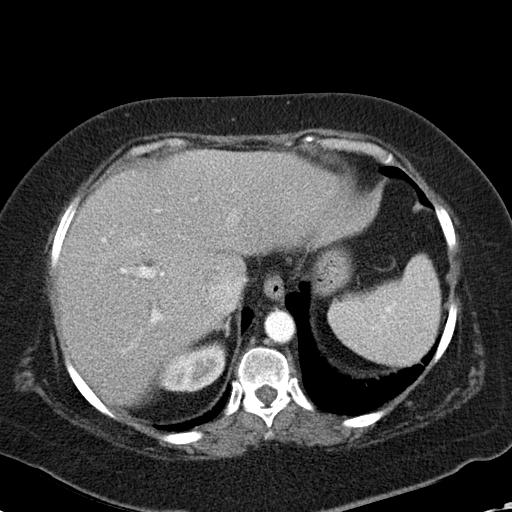
[im 75/88  soft-tissue]
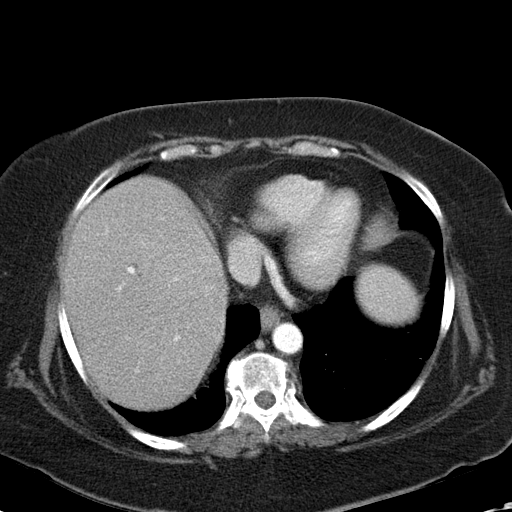
[im 83/88  soft-tissue]
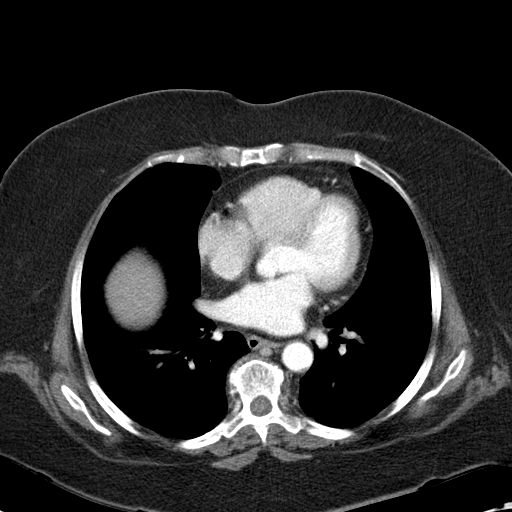

[Series 4: mpr coronal a/p · coronal · 0.71mm/px · 3 of 69 slices shown]
[im 23/69  soft-tissue]
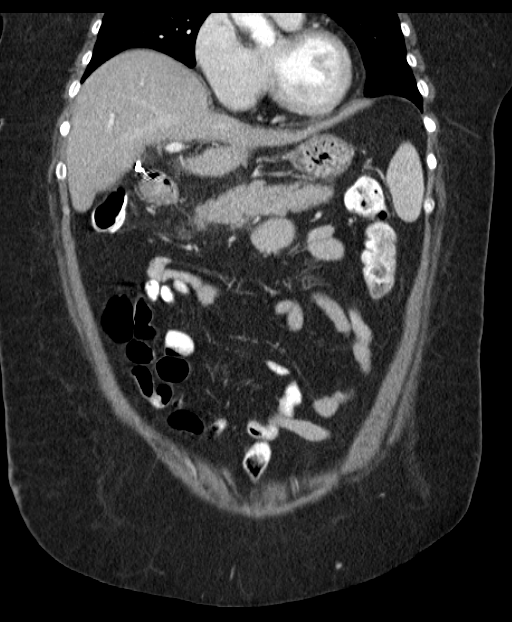
[im 31/69  soft-tissue]
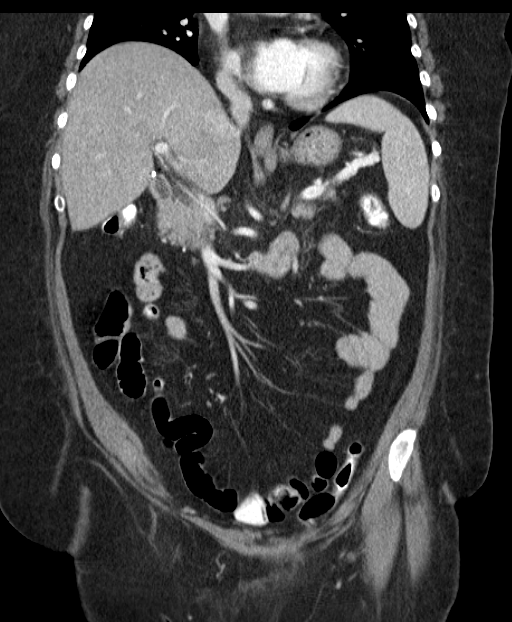
[im 38/69  soft-tissue]
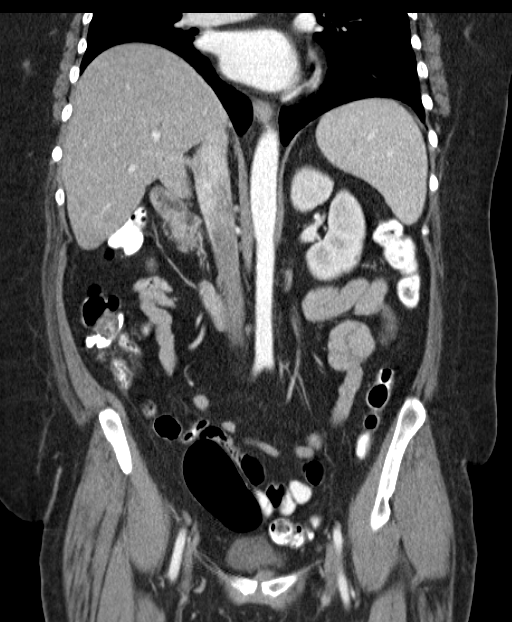

[16 of 46 positions shown; findings below may reference images not displayed]

FINDINGS: Minimal lower lobe atelectasis.  Mild cardiac enlargement.  No pericardial or pleural effusion.  The patient is status post cholecystectomy.  There is minimal prominence of the extrahepatic common bile duct, likely post cholecystectomy effect.  The liver, kidneys, adrenal glands, spleen and bowel are normal.  The pancreas is mildly thickened with minimal indistinctness of the margins.  This is a subtle finding but suspicious for early acute pancreatitis.  Recommend correlation with laboratory values and exam.  No fluid collection, abscess, pseudocyst or vascular complication.  Splenic and portal veins are patent.  In the right lower quadrant, the terminal ileum and appendix are normal.
IMPRESSION: 1. Prominent pancreas with ill-defined margins and suggestion of early inflammation suspicious for early acute pancreatitis, as described.
2. Status post cholecystectomy.  
PELVIS CT WITH CONTRAST:
FINDINGS: No pelvic acute inflammation, free fluid, adenopathy or bowel abnormality.  The patient is status post hysterectomy.
IMPRESSION: No acute pelvic finding.

## 2007-11-28 ENCOUNTER — Telehealth: Payer: Self-pay | Admitting: Family Medicine

## 2007-12-02 ENCOUNTER — Encounter: Payer: Self-pay | Admitting: Family Medicine

## 2007-12-03 ENCOUNTER — Encounter: Payer: Self-pay | Admitting: Family Medicine

## 2007-12-04 ENCOUNTER — Encounter: Payer: Self-pay | Admitting: Family Medicine

## 2007-12-04 IMAGING — CR DG SHOULDER 2+V*L*
3 series · 3 of 3 positions shown · non-contrast
Comparison: none

CLINICAL DATA: Pain.
 LEFT SHOULDER ? 3 VIEW:

[view not recorded (1 of 3)]
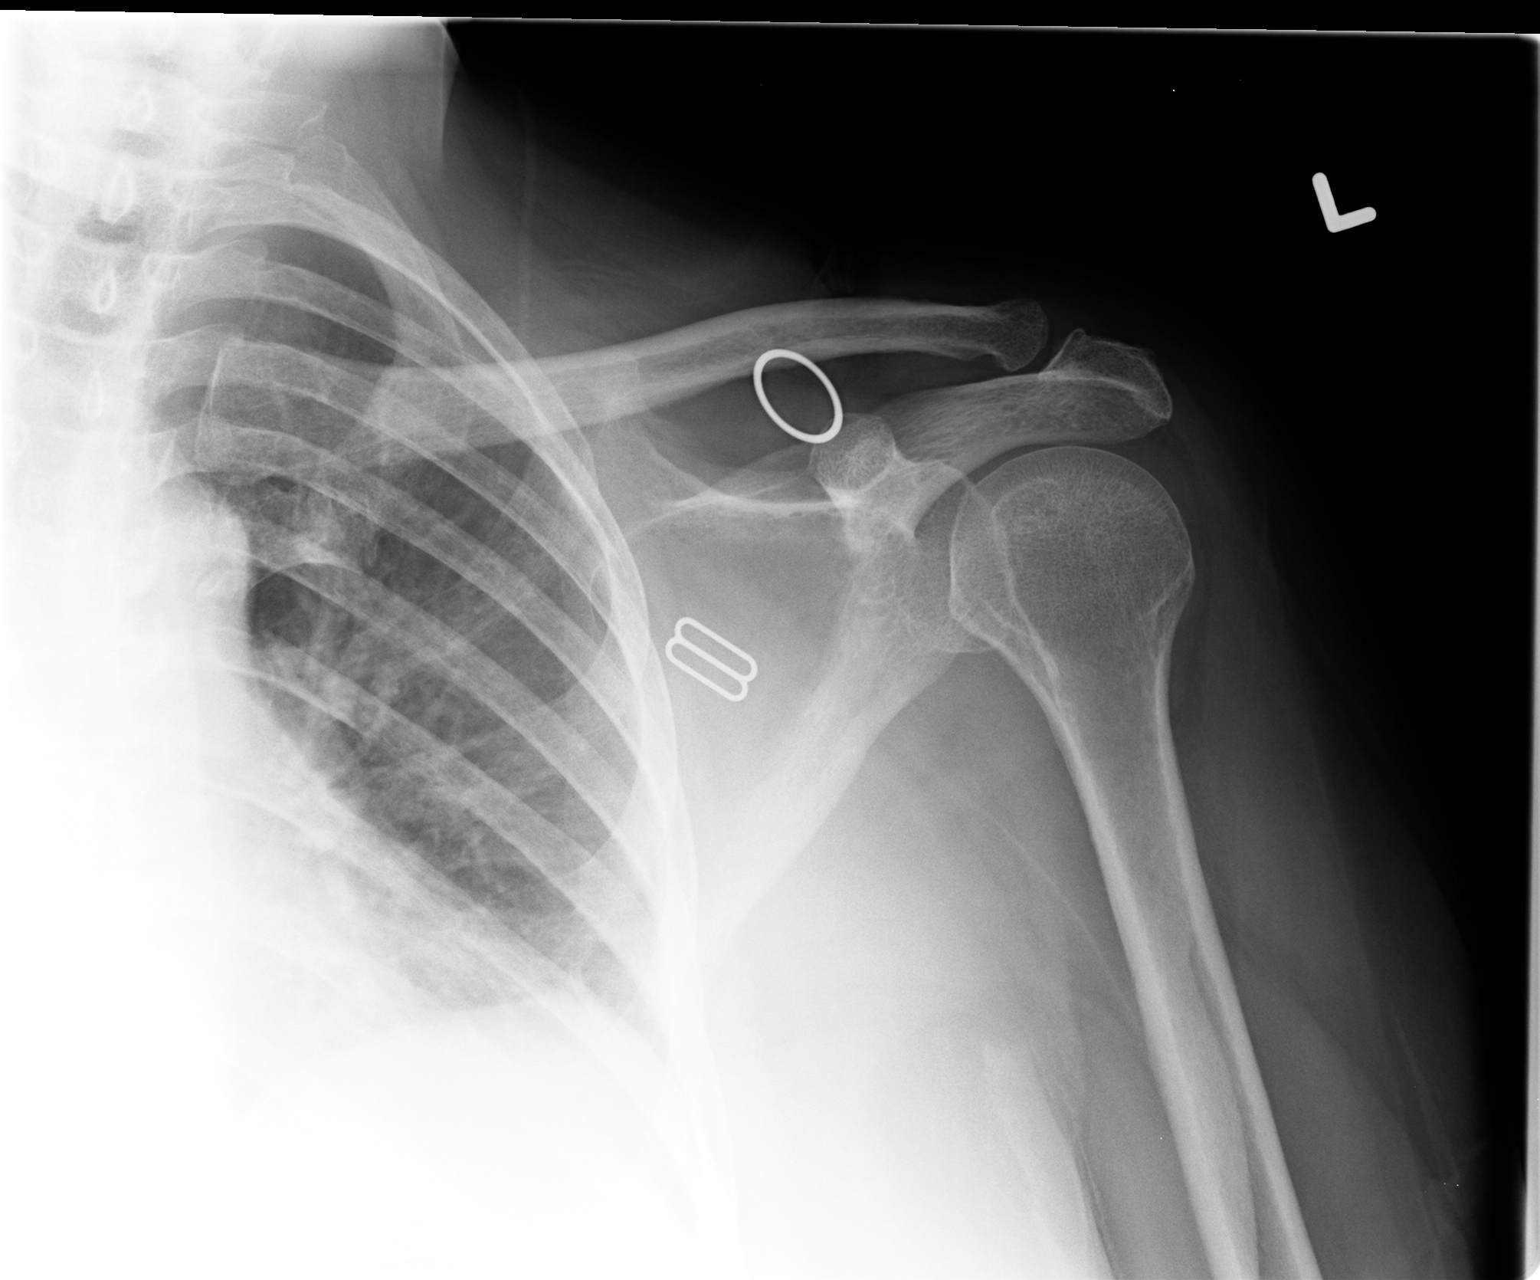

[view not recorded (2 of 3)]
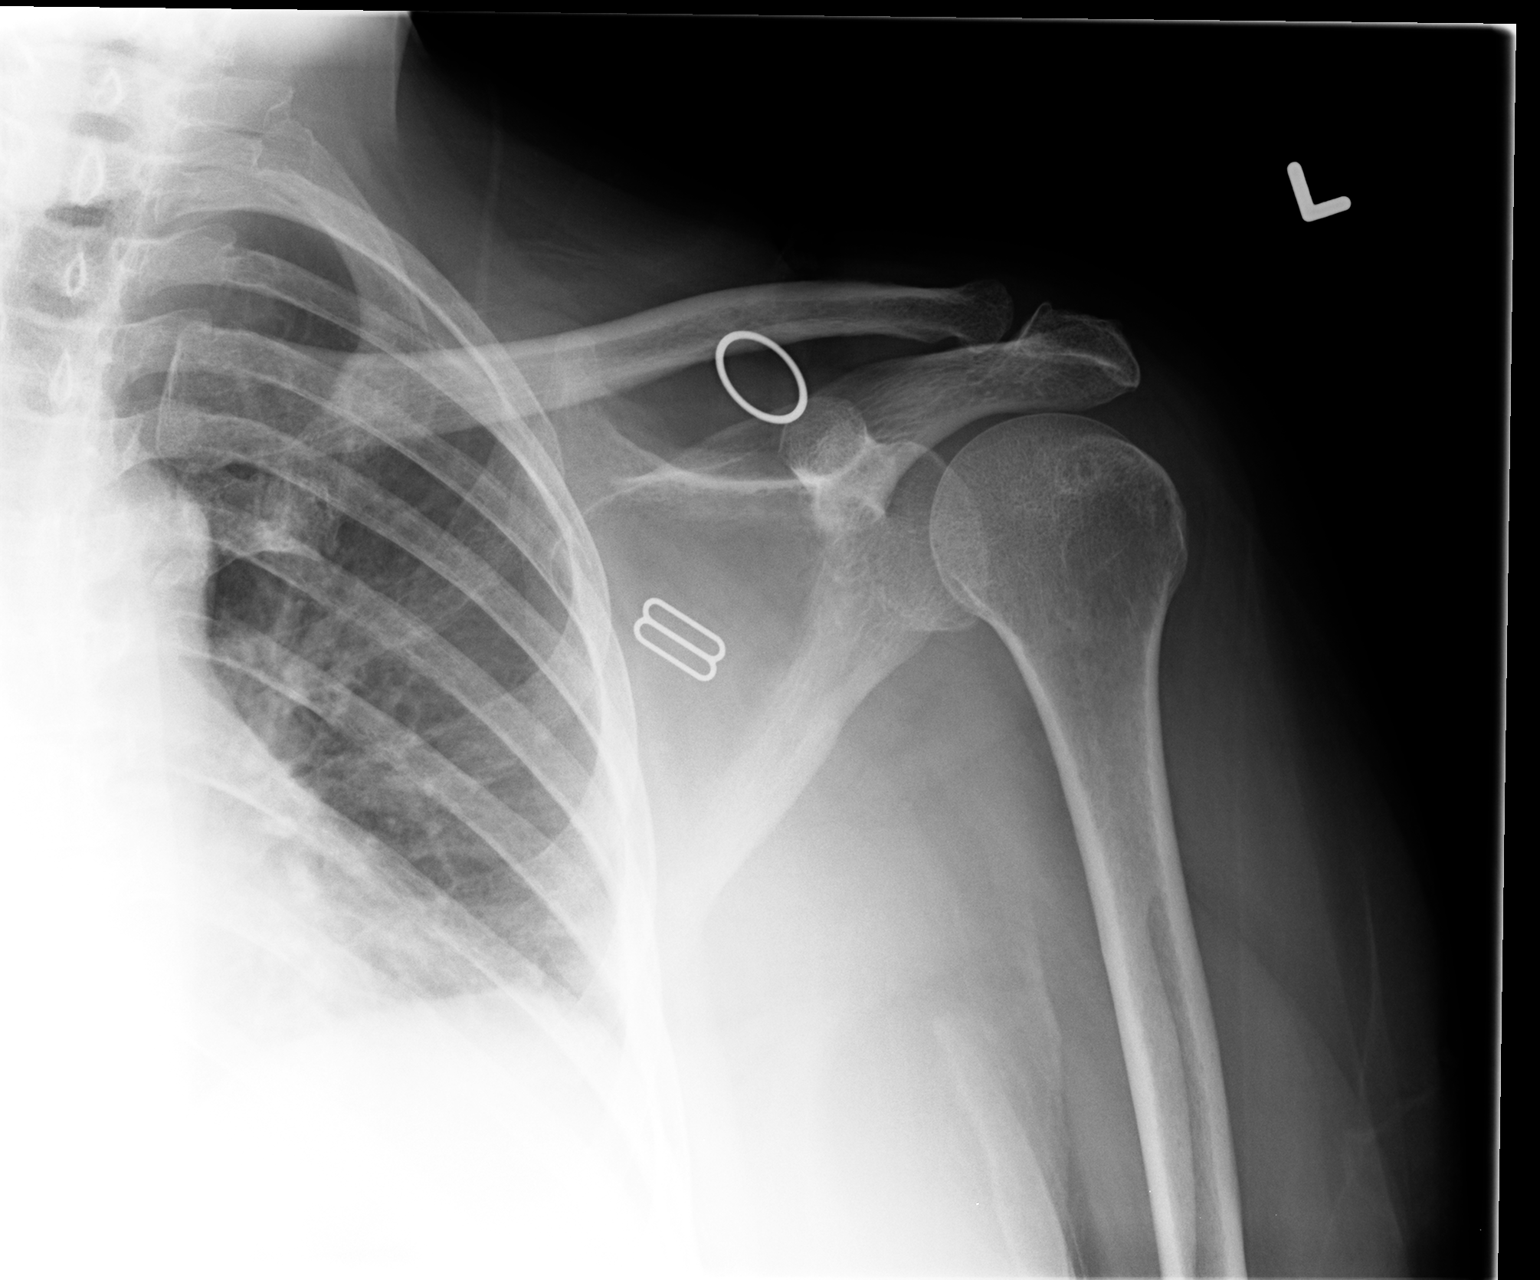

[view not recorded (3 of 3)]
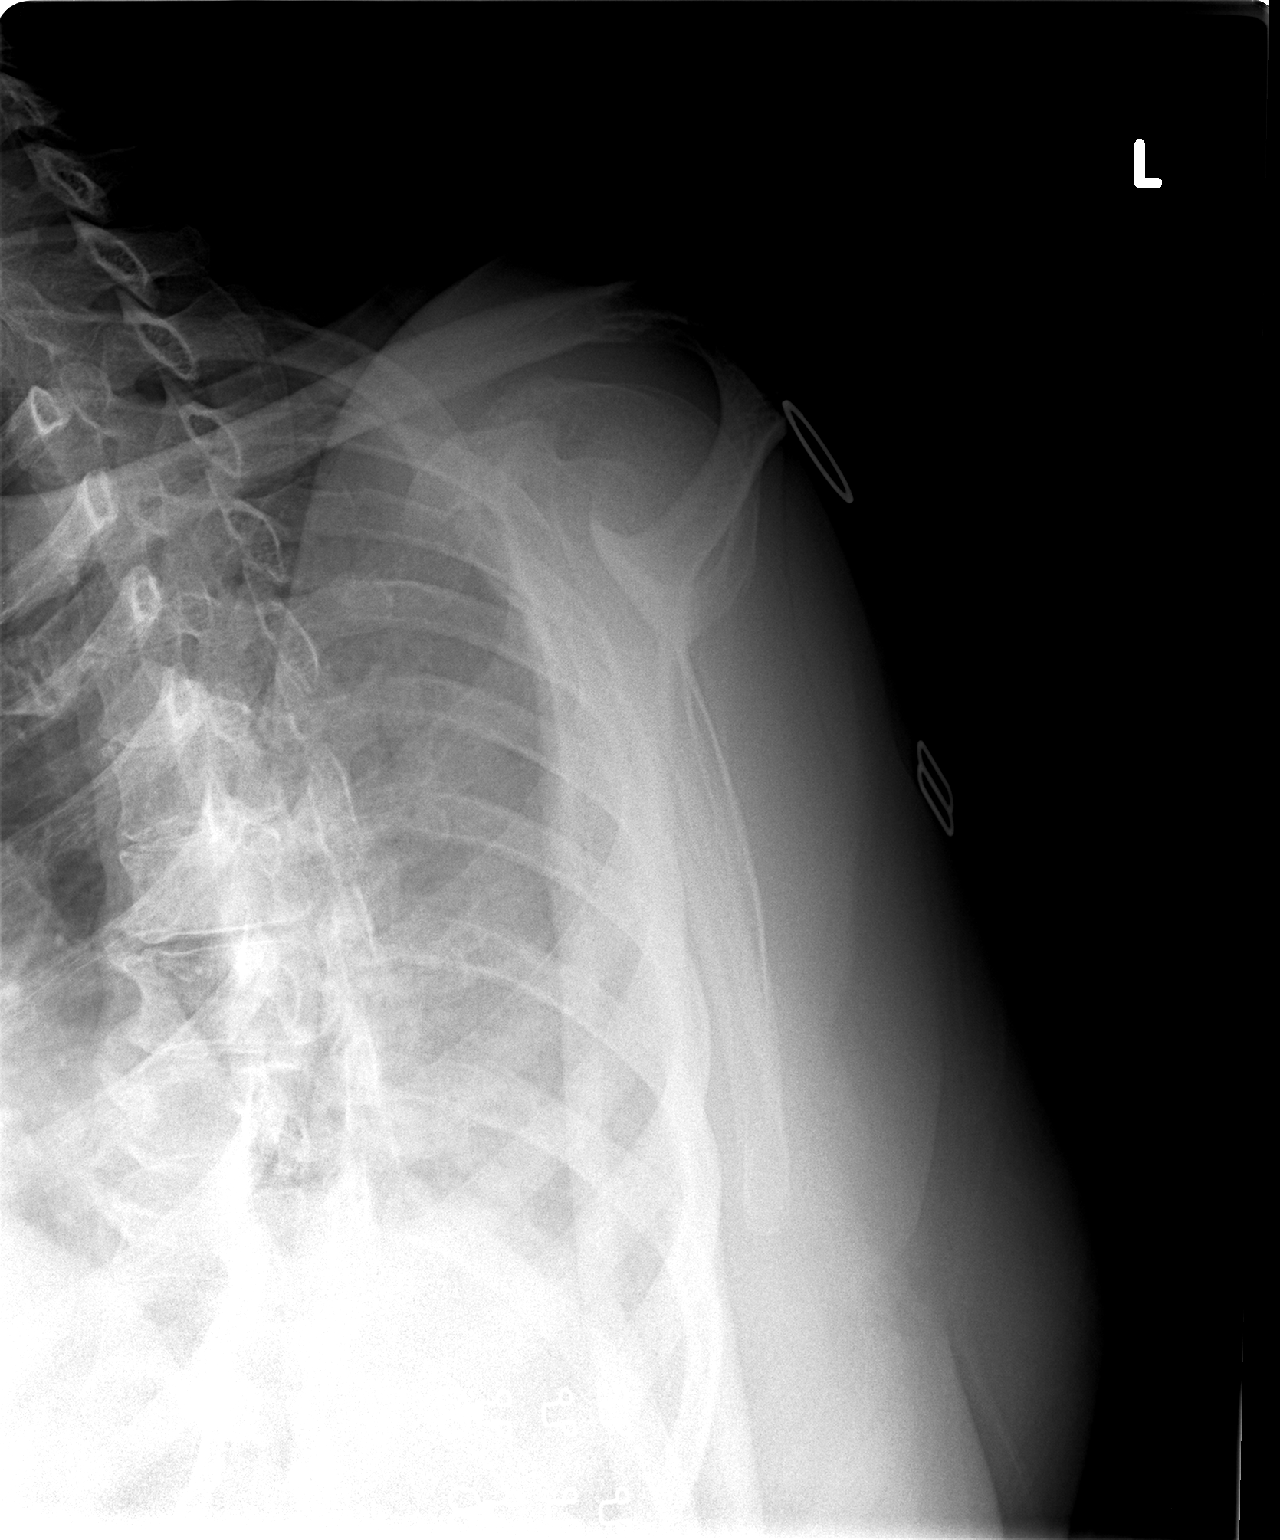

[3 of 3 positions shown; findings below may reference images not displayed]

FINDINGS: Humerus is located.  No fracture.  There is acromioclavicular degenerative change.  Cystic type degenerative change is also seen in the greater tuberosity.  Imaged ribs and lung are unremarkable.
IMPRESSION: No acute finding with degenerative disease about the shoulder noted.

## 2007-12-06 ENCOUNTER — Ambulatory Visit: Payer: Self-pay | Admitting: Internal Medicine

## 2007-12-06 ENCOUNTER — Encounter: Payer: Self-pay | Admitting: Family Medicine

## 2007-12-10 ENCOUNTER — Telehealth: Payer: Self-pay | Admitting: Family Medicine

## 2007-12-10 ENCOUNTER — Ambulatory Visit: Payer: Self-pay | Admitting: Family Medicine

## 2007-12-17 ENCOUNTER — Encounter: Payer: Self-pay | Admitting: Family Medicine

## 2007-12-18 ENCOUNTER — Encounter (HOSPITAL_COMMUNITY): Admission: RE | Admit: 2007-12-18 | Discharge: 2008-01-17 | Payer: Self-pay | Admitting: Internal Medicine

## 2007-12-18 ENCOUNTER — Encounter (HOSPITAL_COMMUNITY): Admission: RE | Admit: 2007-12-18 | Discharge: 2008-01-17 | Payer: Self-pay | Admitting: Family Medicine

## 2007-12-18 ENCOUNTER — Encounter: Payer: Self-pay | Admitting: Family Medicine

## 2007-12-19 ENCOUNTER — Telehealth: Payer: Self-pay | Admitting: Family Medicine

## 2007-12-19 ENCOUNTER — Encounter: Payer: Self-pay | Admitting: Family Medicine

## 2007-12-23 ENCOUNTER — Encounter: Payer: Self-pay | Admitting: Family Medicine

## 2007-12-24 ENCOUNTER — Encounter: Payer: Self-pay | Admitting: Family Medicine

## 2007-12-26 ENCOUNTER — Telehealth: Payer: Self-pay | Admitting: Family Medicine

## 2007-12-27 ENCOUNTER — Encounter: Payer: Self-pay | Admitting: Family Medicine

## 2007-12-30 ENCOUNTER — Telehealth: Payer: Self-pay | Admitting: Family Medicine

## 2007-12-30 ENCOUNTER — Ambulatory Visit: Payer: Self-pay | Admitting: Family Medicine

## 2007-12-30 ENCOUNTER — Telehealth (INDEPENDENT_AMBULATORY_CARE_PROVIDER_SITE_OTHER): Payer: Self-pay | Admitting: *Deleted

## 2007-12-30 DIAGNOSIS — R269 Unspecified abnormalities of gait and mobility: Secondary | ICD-10-CM

## 2007-12-31 ENCOUNTER — Telehealth: Payer: Self-pay | Admitting: Family Medicine

## 2008-01-01 ENCOUNTER — Telehealth: Payer: Self-pay | Admitting: Family Medicine

## 2008-01-02 ENCOUNTER — Encounter: Payer: Self-pay | Admitting: Family Medicine

## 2008-01-03 ENCOUNTER — Ambulatory Visit: Payer: Self-pay | Admitting: Family Medicine

## 2008-01-03 LAB — CONVERTED CEMR LAB
Blood in Urine, dipstick: NEGATIVE
Ketones, urine, test strip: NEGATIVE
Nitrite: NEGATIVE
Protein, U semiquant: NEGATIVE
Urobilinogen, UA: 0.2

## 2008-01-03 IMAGING — CT CT CERVICAL SPINE W/O CM
2 series · 10 of 14 positions shown, 12 images · IV contrast (agent unspecified)
Comparison: none

CLINICAL DATA: Pain after fall.  Altered LOC. 
CERVICAL SPINE CT WITHOUT CONTRAST:
TECHNIQUE: Multidetector CT imaging of the cervical spine was performed.  Multiplanar CT image reconstructions were also generated.

[Series 4: headseq 4.8 h37s · axial · 0.39mm/px · z∈[+130,+199]mm · 3 of 30 slices shown]
[im 8/30  bone]
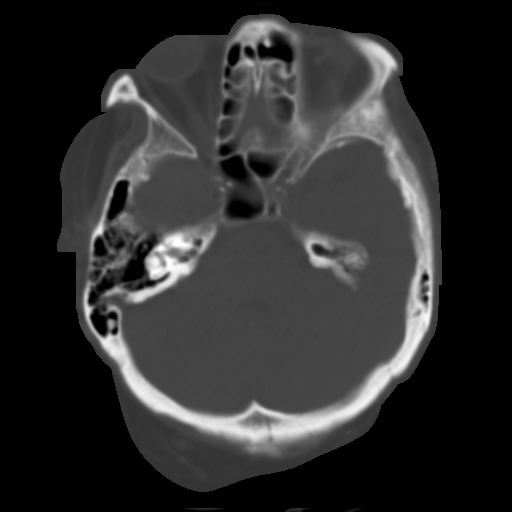
[im 15/30  bone]
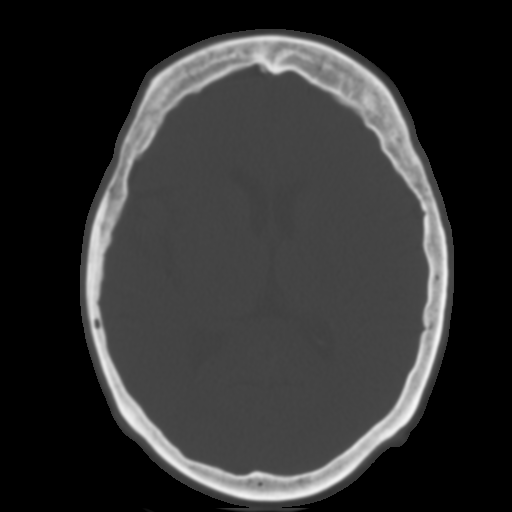
[im 22/30  bone]
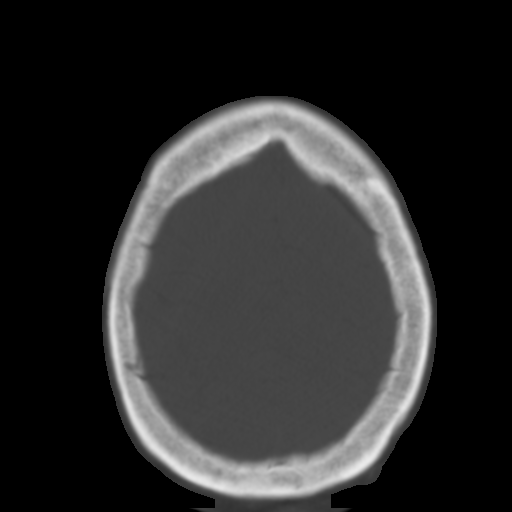

[Series 5: headseq 2.4 h60s · axial · 0.40mm/px · z∈[+112,+220]mm · 7 of 60 slices shown, 9 images]
[im 8/60  soft-tissue]
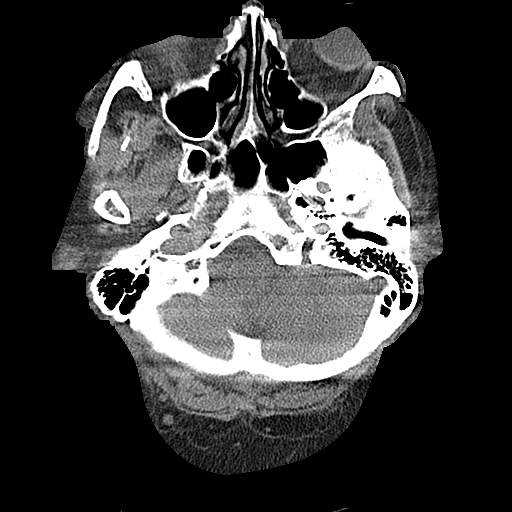
[im 8/60  bone]
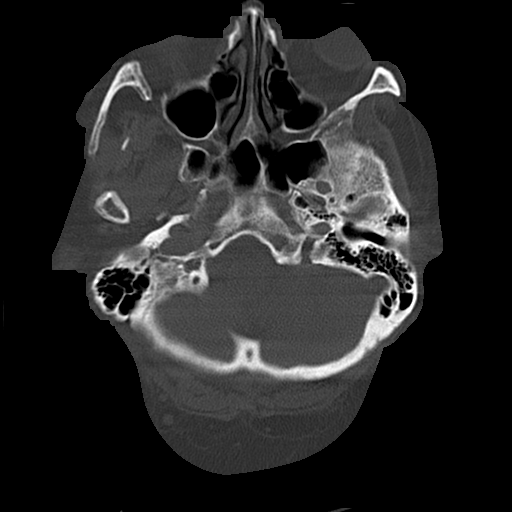
[im 15/60  bone]
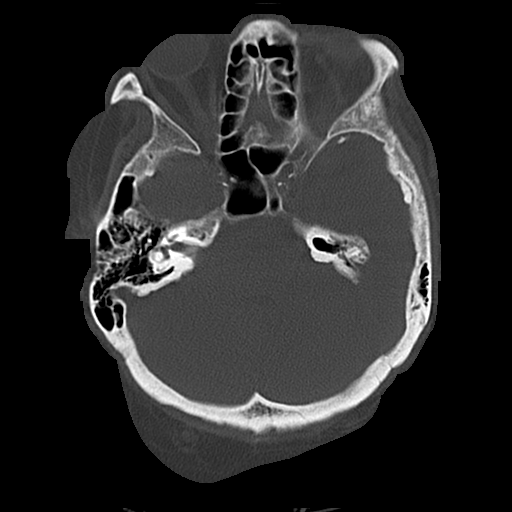
[im 23/60  bone]
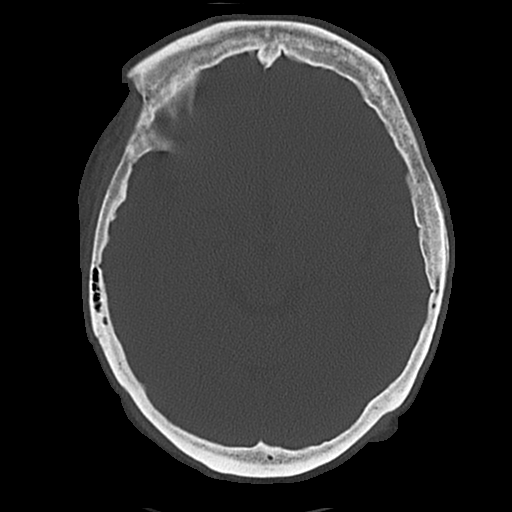
[im 30/60  bone]
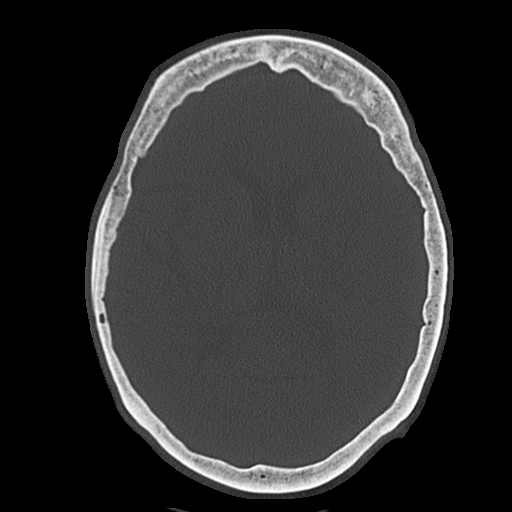
[im 37/60  soft-tissue]
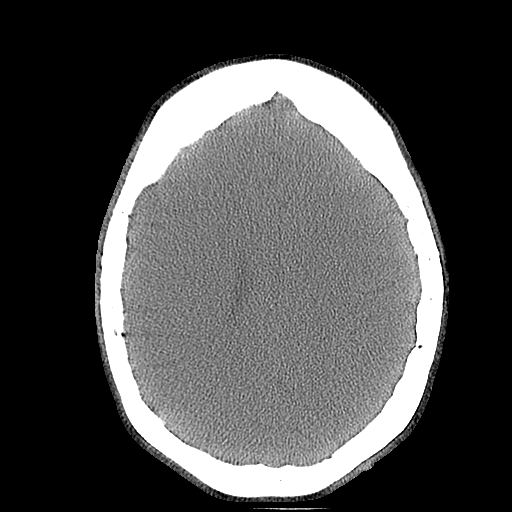
[im 37/60  bone]
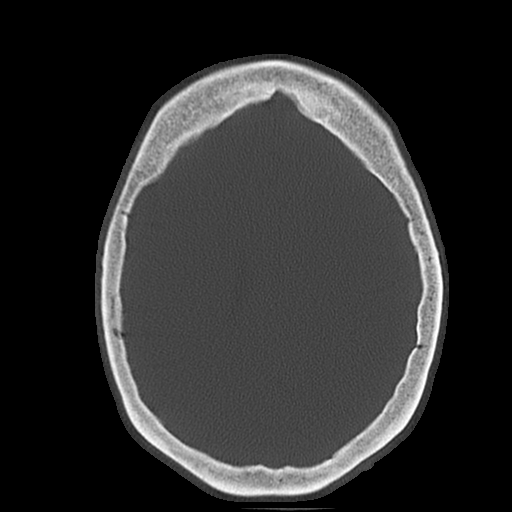
[im 45/60  bone]
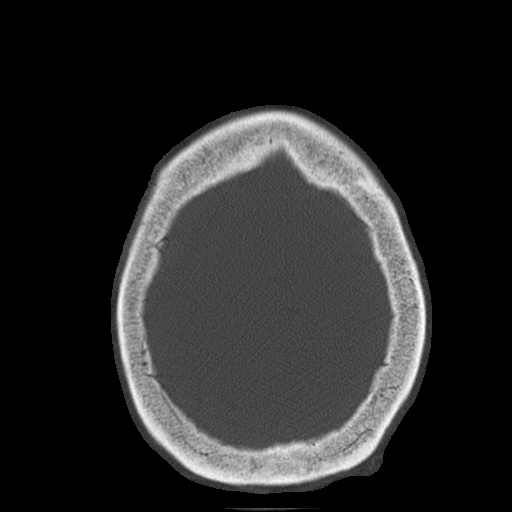
[im 52/60  bone]
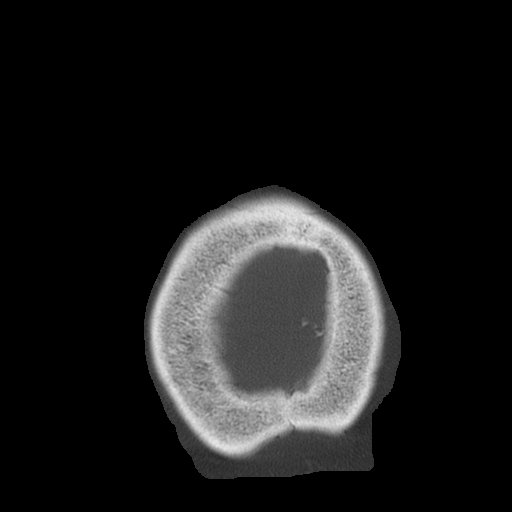

[10 of 14 positions shown; findings below may reference images not displayed]

FINDINGS: There is patient motion artifact which precludes optimal image assessment.  No definite acute fracture.  No subluxation.  Subtle fractures cannot be excluded in the presence of the artifact.  Gaseous distention of the pharynx and proximal esophagus.  This may be due to aerophagia.  Small vague low density focus in the left thyroid lobe of undetermined etiology.  Thyroid carcinoma cannot be excluded.  Recommend thyroid ultrasound.
IMPRESSION: Spondylotic changes mainly at C5-6.  No definite fracture however note that there is motion artifact precluding optimal image assessment.  No subluxation.  oropharynx, hypopharynx, esophagus, and trachea.  I suspect this may be due to aerophagia ? clinically correlate.

## 2008-01-05 IMAGING — CR DG LUMBAR SPINE COMPLETE 4+V
5 series · 5 of 5 positions shown · non-contrast
Comparison: none

HISTORY: Back pain

[view not recorded (1 of 5)]
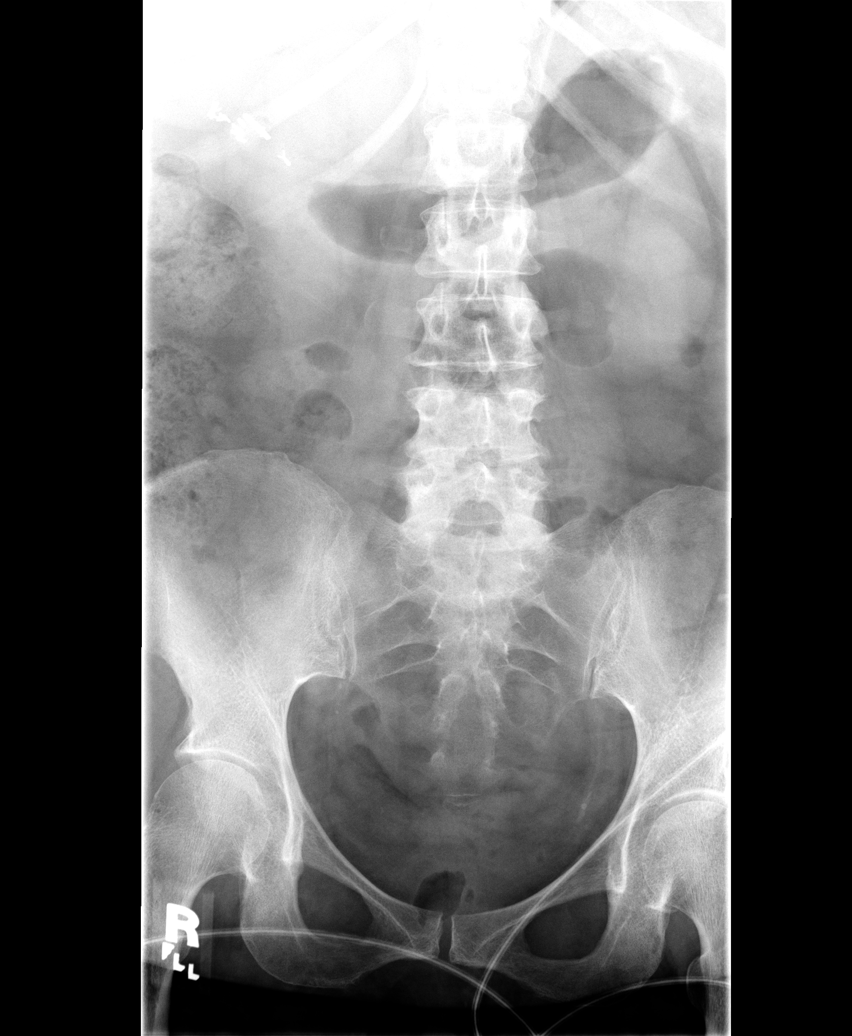

[view not recorded (2 of 5)]
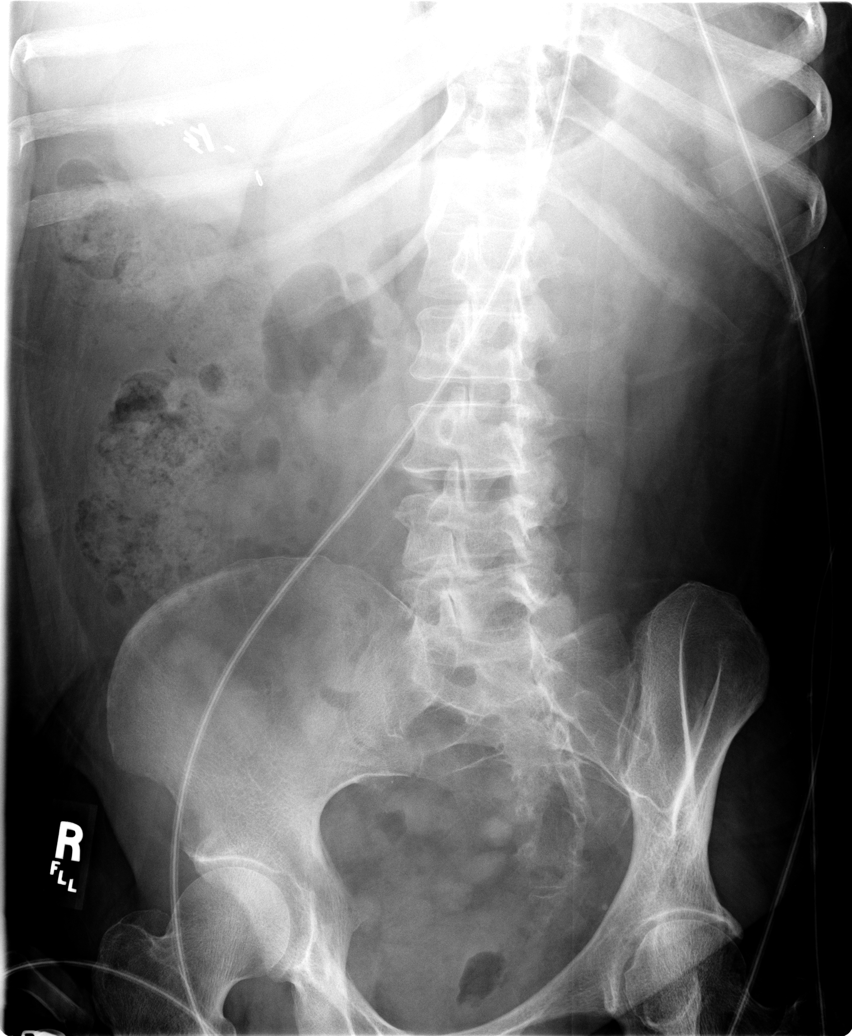

[view not recorded (3 of 5)]
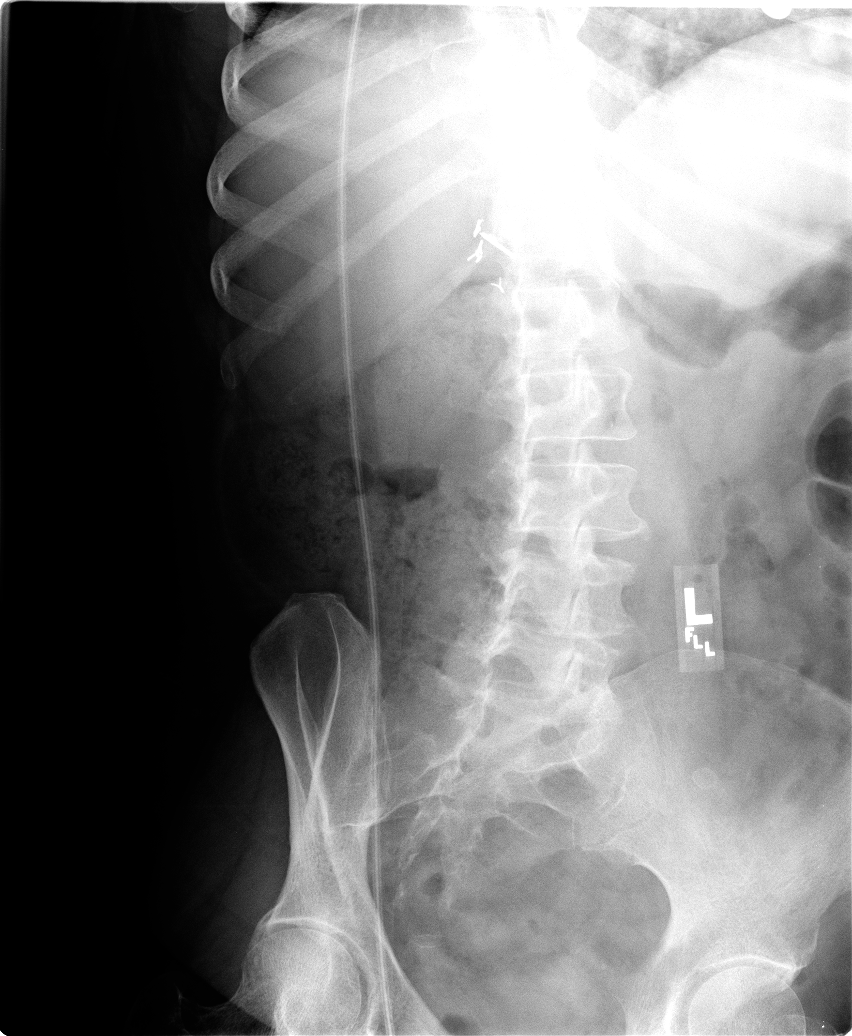

[view not recorded (4 of 5)]
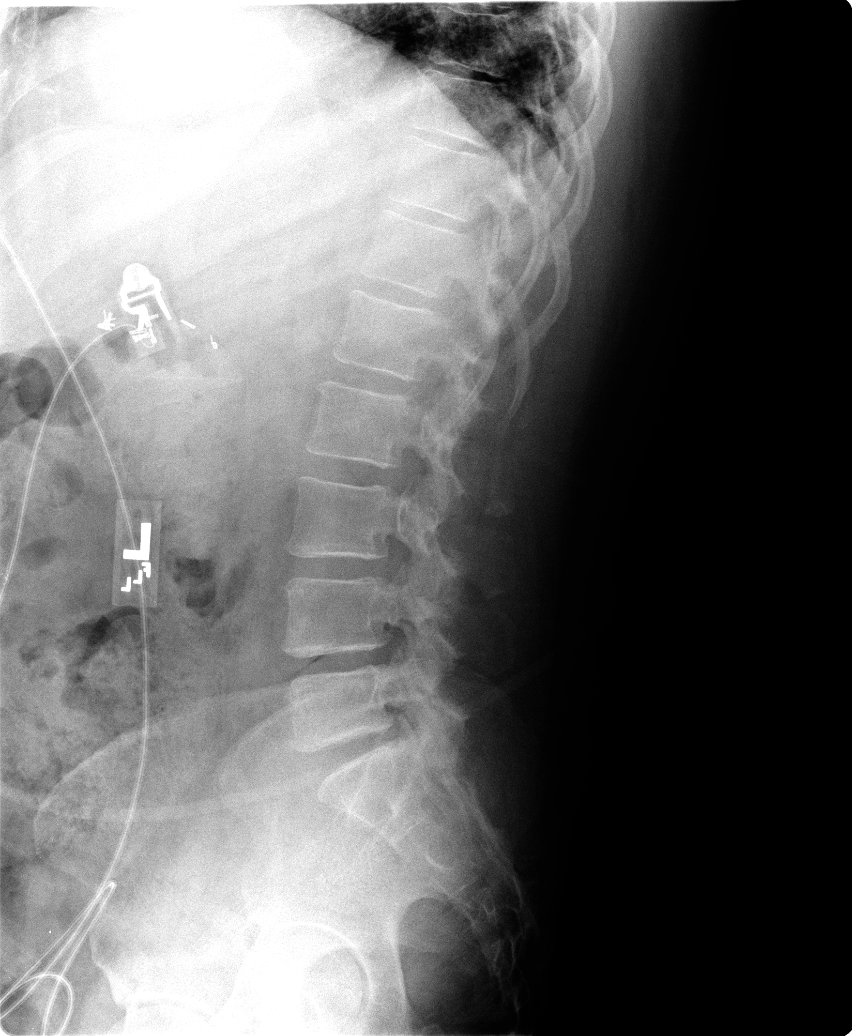

[view not recorded (5 of 5)]
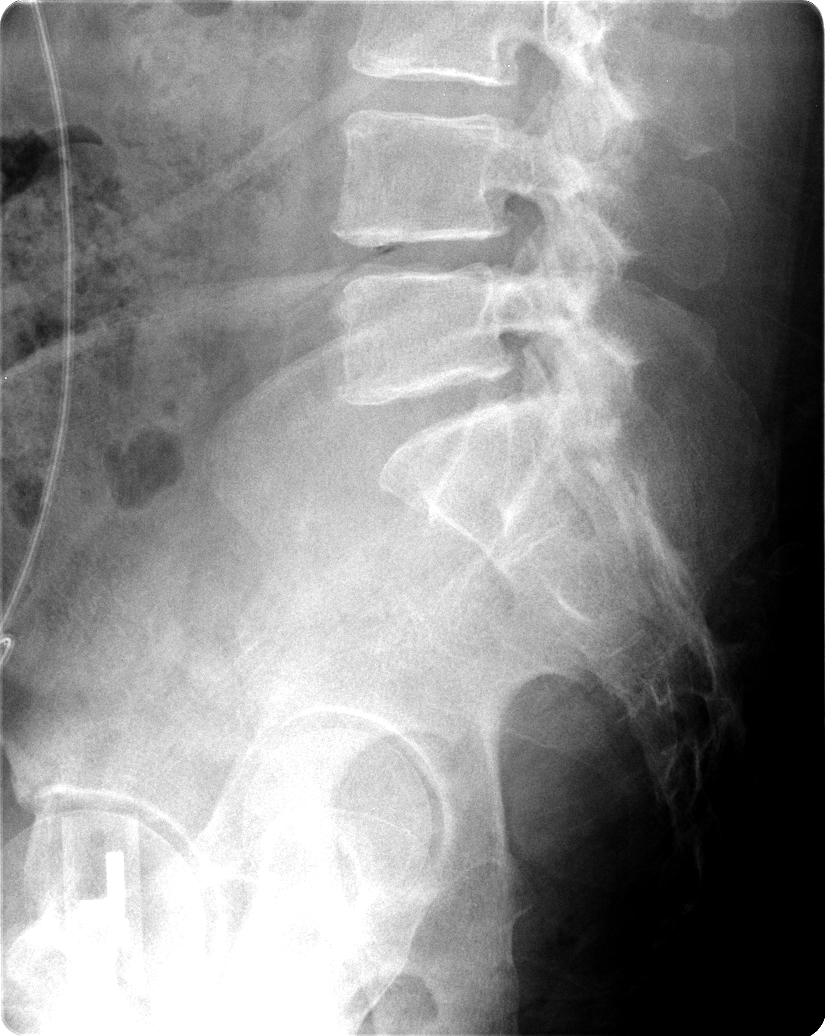

[5 of 5 positions shown; findings below may reference images not displayed]

THORACIC SPINE 3 VIEWS:

12 pairs of ribs.
Multilevel disc space narrowing with endplate spur formation.
No fracture, subluxation, or bone destruction.
Diffuse bony demineralization.
Minimal broad-based dextro-convex scoliosis upper thoracic spine.
Surgical clips right upper quadrant question cholecystectomy.
IMPRESSION: Osteoporosis with minimal scoliosis and scattered multileveled degenerative disc
disease changes.
No acute bony abnormalities.

LUMBAR SPINE 4 VIEWS:

Five nonrib-bearing lumbar vertebra.
Diffuse bony demineralization.
No fracture or subluxation.
Minimal endplate spur formation.
Minimal disc space narrowing L4-L5.
No spondylolysis or bone destruction.
SI joints symmetric.
IMPRESSION: Osteoporosis with minimal degenerative disc disease changes.
No acute bony abnormalities.

## 2008-01-07 ENCOUNTER — Telehealth: Payer: Self-pay | Admitting: Family Medicine

## 2008-01-08 ENCOUNTER — Telehealth: Payer: Self-pay | Admitting: Family Medicine

## 2008-01-09 ENCOUNTER — Telehealth: Payer: Self-pay | Admitting: Family Medicine

## 2008-01-09 ENCOUNTER — Ambulatory Visit: Payer: Self-pay | Admitting: Family Medicine

## 2008-01-09 ENCOUNTER — Emergency Department (HOSPITAL_COMMUNITY): Admission: EM | Admit: 2008-01-09 | Discharge: 2008-01-09 | Payer: Self-pay | Admitting: *Deleted

## 2008-01-10 ENCOUNTER — Telehealth (INDEPENDENT_AMBULATORY_CARE_PROVIDER_SITE_OTHER): Payer: Self-pay | Admitting: Family Medicine

## 2008-01-10 ENCOUNTER — Telehealth: Payer: Self-pay | Admitting: Family Medicine

## 2008-01-13 ENCOUNTER — Telehealth: Payer: Self-pay | Admitting: Family Medicine

## 2008-01-14 ENCOUNTER — Ambulatory Visit: Payer: Self-pay | Admitting: Family Medicine

## 2008-01-14 DIAGNOSIS — R51 Headache: Secondary | ICD-10-CM

## 2008-01-14 DIAGNOSIS — T7840XA Allergy, unspecified, initial encounter: Secondary | ICD-10-CM | POA: Insufficient documentation

## 2008-01-14 DIAGNOSIS — R519 Headache, unspecified: Secondary | ICD-10-CM | POA: Insufficient documentation

## 2008-01-15 ENCOUNTER — Encounter: Payer: Self-pay | Admitting: Family Medicine

## 2008-01-15 ENCOUNTER — Telehealth: Payer: Self-pay | Admitting: Family Medicine

## 2008-01-16 ENCOUNTER — Telehealth: Payer: Self-pay | Admitting: Family Medicine

## 2008-01-20 ENCOUNTER — Encounter (HOSPITAL_COMMUNITY): Admission: RE | Admit: 2008-01-20 | Discharge: 2008-02-19 | Payer: Self-pay | Admitting: Family Medicine

## 2008-01-21 ENCOUNTER — Encounter: Payer: Self-pay | Admitting: Family Medicine

## 2008-01-23 ENCOUNTER — Encounter: Admission: RE | Admit: 2008-01-23 | Discharge: 2008-01-23 | Payer: Self-pay | Admitting: Family Medicine

## 2008-01-23 ENCOUNTER — Telehealth: Payer: Self-pay | Admitting: Family Medicine

## 2008-01-24 ENCOUNTER — Telehealth: Payer: Self-pay | Admitting: Family Medicine

## 2008-01-24 ENCOUNTER — Ambulatory Visit: Payer: Self-pay | Admitting: Family Medicine

## 2008-01-27 ENCOUNTER — Telehealth: Payer: Self-pay | Admitting: Family Medicine

## 2008-01-29 ENCOUNTER — Telehealth: Payer: Self-pay | Admitting: Family Medicine

## 2008-01-30 ENCOUNTER — Ambulatory Visit: Payer: Self-pay | Admitting: Family Medicine

## 2008-01-31 ENCOUNTER — Telehealth: Payer: Self-pay | Admitting: Family Medicine

## 2008-02-02 ENCOUNTER — Observation Stay (HOSPITAL_COMMUNITY): Admission: EM | Admit: 2008-02-02 | Discharge: 2008-02-07 | Payer: Self-pay | Admitting: Emergency Medicine

## 2008-02-03 ENCOUNTER — Encounter: Payer: Self-pay | Admitting: Family Medicine

## 2008-02-03 ENCOUNTER — Telehealth: Payer: Self-pay | Admitting: Family Medicine

## 2008-02-04 ENCOUNTER — Telehealth: Payer: Self-pay | Admitting: Family Medicine

## 2008-02-08 ENCOUNTER — Telehealth: Payer: Self-pay | Admitting: Family Medicine

## 2008-02-09 ENCOUNTER — Telehealth: Payer: Self-pay | Admitting: Family Medicine

## 2008-02-10 ENCOUNTER — Encounter: Payer: Self-pay | Admitting: Family Medicine

## 2008-02-10 ENCOUNTER — Telehealth: Payer: Self-pay | Admitting: Family Medicine

## 2008-02-11 ENCOUNTER — Telehealth: Payer: Self-pay | Admitting: Family Medicine

## 2008-02-11 ENCOUNTER — Encounter: Payer: Self-pay | Admitting: Family Medicine

## 2008-02-12 ENCOUNTER — Telehealth: Payer: Self-pay | Admitting: Family Medicine

## 2008-02-12 ENCOUNTER — Encounter: Payer: Self-pay | Admitting: Family Medicine

## 2008-02-13 ENCOUNTER — Ambulatory Visit: Payer: Self-pay | Admitting: Family Medicine

## 2008-02-13 LAB — CONVERTED CEMR LAB: Glucose, Bld: 154 mg/dL

## 2008-02-17 ENCOUNTER — Encounter: Payer: Self-pay | Admitting: Family Medicine

## 2008-02-24 ENCOUNTER — Encounter: Payer: Self-pay | Admitting: Family Medicine

## 2008-02-25 ENCOUNTER — Encounter (HOSPITAL_COMMUNITY): Admission: RE | Admit: 2008-02-25 | Discharge: 2008-03-12 | Payer: Self-pay | Admitting: Family Medicine

## 2008-02-26 ENCOUNTER — Encounter: Payer: Self-pay | Admitting: Family Medicine

## 2008-02-27 ENCOUNTER — Encounter: Payer: Self-pay | Admitting: Family Medicine

## 2008-02-27 ENCOUNTER — Telehealth: Payer: Self-pay | Admitting: Family Medicine

## 2008-02-28 ENCOUNTER — Telehealth: Payer: Self-pay | Admitting: Family Medicine

## 2008-02-28 ENCOUNTER — Encounter: Payer: Self-pay | Admitting: Family Medicine

## 2008-03-02 ENCOUNTER — Encounter: Payer: Self-pay | Admitting: Family Medicine

## 2008-03-03 ENCOUNTER — Encounter: Payer: Self-pay | Admitting: Family Medicine

## 2008-03-09 ENCOUNTER — Telehealth: Payer: Self-pay | Admitting: Family Medicine

## 2008-03-10 ENCOUNTER — Ambulatory Visit: Payer: Self-pay | Admitting: Family Medicine

## 2008-03-10 ENCOUNTER — Telehealth: Payer: Self-pay | Admitting: Family Medicine

## 2008-03-10 DIAGNOSIS — IMO0002 Reserved for concepts with insufficient information to code with codable children: Secondary | ICD-10-CM

## 2008-03-10 LAB — CONVERTED CEMR LAB: Glucose, Bld: 109 mg/dL

## 2008-03-11 ENCOUNTER — Telehealth: Payer: Self-pay | Admitting: Family Medicine

## 2008-03-11 ENCOUNTER — Encounter: Payer: Self-pay | Admitting: Family Medicine

## 2008-03-13 HISTORY — PX: TRANSTHORACIC ECHOCARDIOGRAM: SHX275

## 2008-03-13 HISTORY — PX: OTHER SURGICAL HISTORY: SHX169

## 2008-03-16 ENCOUNTER — Encounter: Payer: Self-pay | Admitting: Family Medicine

## 2008-03-16 ENCOUNTER — Telehealth: Payer: Self-pay | Admitting: Family Medicine

## 2008-03-17 ENCOUNTER — Encounter: Payer: Self-pay | Admitting: Family Medicine

## 2008-03-18 ENCOUNTER — Encounter (HOSPITAL_COMMUNITY): Admission: RE | Admit: 2008-03-18 | Discharge: 2008-03-24 | Payer: Self-pay | Admitting: Family Medicine

## 2008-03-23 ENCOUNTER — Telehealth: Payer: Self-pay | Admitting: Family Medicine

## 2008-03-24 ENCOUNTER — Encounter: Payer: Self-pay | Admitting: Family Medicine

## 2008-03-31 ENCOUNTER — Encounter: Admission: RE | Admit: 2008-03-31 | Discharge: 2008-03-31 | Payer: Self-pay | Admitting: Family Medicine

## 2008-04-01 ENCOUNTER — Telehealth: Payer: Self-pay | Admitting: Family Medicine

## 2008-04-02 ENCOUNTER — Telehealth: Payer: Self-pay | Admitting: Family Medicine

## 2008-04-02 ENCOUNTER — Encounter: Payer: Self-pay | Admitting: Family Medicine

## 2008-04-03 ENCOUNTER — Telehealth: Payer: Self-pay | Admitting: Family Medicine

## 2008-04-06 ENCOUNTER — Ambulatory Visit: Payer: Self-pay | Admitting: Family Medicine

## 2008-04-06 ENCOUNTER — Telehealth: Payer: Self-pay | Admitting: Family Medicine

## 2008-04-07 ENCOUNTER — Telehealth: Payer: Self-pay | Admitting: Family Medicine

## 2008-04-08 ENCOUNTER — Encounter: Payer: Self-pay | Admitting: Family Medicine

## 2008-04-10 ENCOUNTER — Emergency Department (HOSPITAL_COMMUNITY): Admission: EM | Admit: 2008-04-10 | Discharge: 2008-04-10 | Payer: Self-pay | Admitting: Emergency Medicine

## 2008-04-13 ENCOUNTER — Encounter: Admission: RE | Admit: 2008-04-13 | Discharge: 2008-04-13 | Payer: Self-pay | Admitting: Family Medicine

## 2008-04-14 ENCOUNTER — Telehealth: Payer: Self-pay | Admitting: Family Medicine

## 2008-04-14 ENCOUNTER — Ambulatory Visit: Payer: Self-pay | Admitting: Family Medicine

## 2008-04-14 LAB — CONVERTED CEMR LAB: Glucose, Bld: 182 mg/dL

## 2008-04-15 ENCOUNTER — Encounter: Payer: Self-pay | Admitting: Family Medicine

## 2008-04-15 LAB — CONVERTED CEMR LAB
Creatinine, Urine: 134.7 mg/dL
Microalb Creat Ratio: 10.5 mg/g (ref 0.0–30.0)
Microalb, Ur: 1.42 mg/dL (ref 0.00–1.89)

## 2008-04-16 ENCOUNTER — Telehealth: Payer: Self-pay | Admitting: Family Medicine

## 2008-04-20 ENCOUNTER — Telehealth: Payer: Self-pay | Admitting: Family Medicine

## 2008-04-20 ENCOUNTER — Ambulatory Visit (HOSPITAL_COMMUNITY): Admission: RE | Admit: 2008-04-20 | Discharge: 2008-04-20 | Payer: Self-pay | Admitting: Family Medicine

## 2008-04-21 ENCOUNTER — Telehealth: Payer: Self-pay | Admitting: Family Medicine

## 2008-04-22 ENCOUNTER — Telehealth: Payer: Self-pay | Admitting: Family Medicine

## 2008-04-22 ENCOUNTER — Encounter: Payer: Self-pay | Admitting: Family Medicine

## 2008-04-24 ENCOUNTER — Telehealth: Payer: Self-pay | Admitting: Family Medicine

## 2008-04-24 ENCOUNTER — Encounter: Payer: Self-pay | Admitting: Family Medicine

## 2008-04-28 ENCOUNTER — Encounter: Payer: Self-pay | Admitting: Family Medicine

## 2008-04-29 ENCOUNTER — Other Ambulatory Visit: Payer: Self-pay | Admitting: General Surgery

## 2008-04-29 ENCOUNTER — Telehealth: Payer: Self-pay | Admitting: Family Medicine

## 2008-04-30 ENCOUNTER — Encounter: Payer: Self-pay | Admitting: Family Medicine

## 2008-05-01 ENCOUNTER — Telehealth: Payer: Self-pay | Admitting: Family Medicine

## 2008-05-01 ENCOUNTER — Encounter (INDEPENDENT_AMBULATORY_CARE_PROVIDER_SITE_OTHER): Payer: Self-pay | Admitting: General Surgery

## 2008-05-01 ENCOUNTER — Ambulatory Visit (HOSPITAL_COMMUNITY): Admission: RE | Admit: 2008-05-01 | Discharge: 2008-05-01 | Payer: Self-pay | Admitting: General Surgery

## 2008-05-05 ENCOUNTER — Telehealth: Payer: Self-pay | Admitting: Family Medicine

## 2008-05-05 ENCOUNTER — Ambulatory Visit: Payer: Self-pay | Admitting: Family Medicine

## 2008-05-08 ENCOUNTER — Encounter: Payer: Self-pay | Admitting: Family Medicine

## 2008-05-11 ENCOUNTER — Telehealth: Payer: Self-pay | Admitting: Family Medicine

## 2008-05-12 ENCOUNTER — Ambulatory Visit: Payer: Self-pay | Admitting: Family Medicine

## 2008-05-12 DIAGNOSIS — R5381 Other malaise: Secondary | ICD-10-CM

## 2008-05-12 DIAGNOSIS — R5383 Other fatigue: Secondary | ICD-10-CM

## 2008-05-13 ENCOUNTER — Encounter: Payer: Self-pay | Admitting: Family Medicine

## 2008-05-14 ENCOUNTER — Telehealth: Payer: Self-pay | Admitting: Family Medicine

## 2008-05-15 ENCOUNTER — Telehealth: Payer: Self-pay | Admitting: Family Medicine

## 2008-05-18 ENCOUNTER — Encounter: Payer: Self-pay | Admitting: Family Medicine

## 2008-05-18 ENCOUNTER — Telehealth: Payer: Self-pay | Admitting: Family Medicine

## 2008-05-19 ENCOUNTER — Ambulatory Visit: Payer: Self-pay | Admitting: Family Medicine

## 2008-05-19 ENCOUNTER — Encounter: Payer: Self-pay | Admitting: Family Medicine

## 2008-05-19 ENCOUNTER — Ambulatory Visit (HOSPITAL_COMMUNITY): Admission: RE | Admit: 2008-05-19 | Discharge: 2008-05-19 | Payer: Self-pay | Admitting: Family Medicine

## 2008-05-20 ENCOUNTER — Telehealth: Payer: Self-pay | Admitting: Family Medicine

## 2008-05-20 ENCOUNTER — Encounter: Payer: Self-pay | Admitting: Family Medicine

## 2008-05-20 LAB — CONVERTED CEMR LAB
ALT: 16 units/L (ref 0–35)
AST: 20 units/L (ref 0–37)
Alkaline Phosphatase: 82 units/L (ref 39–117)
BUN: 30 mg/dL — ABNORMAL HIGH (ref 6–23)
Bilirubin, Direct: 0.1 mg/dL (ref 0.0–0.3)
Calcium: 9 mg/dL (ref 8.4–10.5)
Cholesterol: 127 mg/dL (ref 0–200)
Creatinine, Ser: 0.78 mg/dL (ref 0.40–1.20)
Glucose, Bld: 93 mg/dL (ref 70–99)
Indirect Bilirubin: 0.2 mg/dL (ref 0.0–0.9)
Potassium: 4.4 meq/L (ref 3.5–5.3)
Total Protein: 7.1 g/dL (ref 6.0–8.3)
Triglycerides: 96 mg/dL (ref <150)
VLDL: 19 mg/dL (ref 0–40)

## 2008-05-21 ENCOUNTER — Ambulatory Visit: Payer: Self-pay | Admitting: Family Medicine

## 2008-05-21 ENCOUNTER — Ambulatory Visit (HOSPITAL_COMMUNITY): Admission: RE | Admit: 2008-05-21 | Discharge: 2008-05-21 | Payer: Self-pay | Admitting: Family Medicine

## 2008-05-21 DIAGNOSIS — M79609 Pain in unspecified limb: Secondary | ICD-10-CM

## 2008-05-21 LAB — CONVERTED CEMR LAB
Band Neutrophils: 0 % (ref 0–10)
Basophils Absolute: 0 10*3/uL (ref 0.0–0.1)
Eosinophils Absolute: 0.2 10*3/uL (ref 0.0–0.7)
Eosinophils Relative: 3 % (ref 0–5)
HCT: 34.3 % — ABNORMAL LOW (ref 36.0–46.0)
Lymphocytes Relative: 45 % (ref 12–46)
MCHC: 28.6 g/dL — ABNORMAL LOW (ref 30.0–36.0)
MCV: 75.9 fL — ABNORMAL LOW (ref 78.0–100.0)
Monocytes Absolute: 0.6 10*3/uL (ref 0.1–1.0)
Platelets: 287 10*3/uL (ref 150–400)
RDW: 17.5 % — ABNORMAL HIGH (ref 11.5–15.5)
WBC: 6.8 10*3/uL (ref 4.0–10.5)

## 2008-05-22 ENCOUNTER — Encounter: Payer: Self-pay | Admitting: Family Medicine

## 2008-05-25 ENCOUNTER — Encounter: Payer: Self-pay | Admitting: Family Medicine

## 2008-05-26 ENCOUNTER — Telehealth: Payer: Self-pay | Admitting: Family Medicine

## 2008-05-27 ENCOUNTER — Telehealth: Payer: Self-pay | Admitting: Orthopedic Surgery

## 2008-05-28 ENCOUNTER — Encounter: Payer: Self-pay | Admitting: Family Medicine

## 2008-06-01 ENCOUNTER — Encounter: Payer: Self-pay | Admitting: Family Medicine

## 2008-06-01 ENCOUNTER — Telehealth: Payer: Self-pay | Admitting: Family Medicine

## 2008-06-02 ENCOUNTER — Ambulatory Visit: Payer: Self-pay | Admitting: Family Medicine

## 2008-06-03 ENCOUNTER — Telehealth: Payer: Self-pay | Admitting: Family Medicine

## 2008-06-05 ENCOUNTER — Telehealth: Payer: Self-pay | Admitting: Family Medicine

## 2008-06-09 ENCOUNTER — Telehealth: Payer: Self-pay | Admitting: Family Medicine

## 2008-06-09 ENCOUNTER — Telehealth (INDEPENDENT_AMBULATORY_CARE_PROVIDER_SITE_OTHER): Payer: Self-pay

## 2008-06-09 ENCOUNTER — Encounter: Payer: Self-pay | Admitting: Family Medicine

## 2008-06-10 ENCOUNTER — Encounter (INDEPENDENT_AMBULATORY_CARE_PROVIDER_SITE_OTHER): Payer: Self-pay

## 2008-06-10 ENCOUNTER — Encounter: Payer: Self-pay | Admitting: Family Medicine

## 2008-06-10 ENCOUNTER — Telehealth: Payer: Self-pay | Admitting: Family Medicine

## 2008-06-17 ENCOUNTER — Telehealth: Payer: Self-pay | Admitting: Family Medicine

## 2008-06-19 ENCOUNTER — Ambulatory Visit: Payer: Self-pay | Admitting: Family Medicine

## 2008-06-19 LAB — CONVERTED CEMR LAB: Glucose, Bld: 242 mg/dL

## 2008-06-22 ENCOUNTER — Telehealth: Payer: Self-pay | Admitting: Family Medicine

## 2008-06-24 ENCOUNTER — Encounter: Payer: Self-pay | Admitting: Family Medicine

## 2008-07-02 ENCOUNTER — Telehealth: Payer: Self-pay | Admitting: Family Medicine

## 2008-07-03 ENCOUNTER — Telehealth: Payer: Self-pay | Admitting: Family Medicine

## 2008-07-05 ENCOUNTER — Inpatient Hospital Stay (HOSPITAL_COMMUNITY): Admission: EM | Admit: 2008-07-05 | Discharge: 2008-07-07 | Payer: Self-pay | Admitting: Emergency Medicine

## 2008-07-07 ENCOUNTER — Encounter: Payer: Self-pay | Admitting: Family Medicine

## 2008-07-07 ENCOUNTER — Telehealth: Payer: Self-pay | Admitting: Family Medicine

## 2008-07-08 ENCOUNTER — Telehealth: Payer: Self-pay | Admitting: Family Medicine

## 2008-07-10 ENCOUNTER — Telehealth: Payer: Self-pay | Admitting: Family Medicine

## 2008-07-14 ENCOUNTER — Telehealth: Payer: Self-pay | Admitting: Family Medicine

## 2008-07-14 ENCOUNTER — Encounter: Payer: Self-pay | Admitting: Family Medicine

## 2008-07-15 ENCOUNTER — Encounter (INDEPENDENT_AMBULATORY_CARE_PROVIDER_SITE_OTHER): Payer: Self-pay | Admitting: Internal Medicine

## 2008-07-15 ENCOUNTER — Observation Stay (HOSPITAL_COMMUNITY): Admission: EM | Admit: 2008-07-15 | Discharge: 2008-07-19 | Payer: Self-pay | Admitting: Emergency Medicine

## 2008-07-15 ENCOUNTER — Encounter: Payer: Self-pay | Admitting: Family Medicine

## 2008-07-16 ENCOUNTER — Ambulatory Visit: Payer: Self-pay | Admitting: Gastroenterology

## 2008-07-17 ENCOUNTER — Ambulatory Visit: Payer: Self-pay | Admitting: Gastroenterology

## 2008-07-17 ENCOUNTER — Encounter: Payer: Self-pay | Admitting: Gastroenterology

## 2008-07-20 ENCOUNTER — Telehealth: Payer: Self-pay | Admitting: Family Medicine

## 2008-07-21 ENCOUNTER — Encounter: Payer: Self-pay | Admitting: Family Medicine

## 2008-07-22 ENCOUNTER — Encounter: Payer: Self-pay | Admitting: Family Medicine

## 2008-07-22 ENCOUNTER — Encounter: Payer: Self-pay | Admitting: Gastroenterology

## 2008-07-23 ENCOUNTER — Telehealth: Payer: Self-pay | Admitting: Family Medicine

## 2008-07-23 ENCOUNTER — Encounter: Payer: Self-pay | Admitting: Family Medicine

## 2008-07-24 ENCOUNTER — Encounter: Payer: Self-pay | Admitting: Gastroenterology

## 2008-07-24 ENCOUNTER — Encounter: Payer: Self-pay | Admitting: Family Medicine

## 2008-07-27 ENCOUNTER — Encounter: Payer: Self-pay | Admitting: Family Medicine

## 2008-07-28 ENCOUNTER — Ambulatory Visit: Payer: Self-pay | Admitting: Family Medicine

## 2008-07-28 LAB — CONVERTED CEMR LAB: Hgb A1c MFr Bld: 5.9 %

## 2008-07-29 ENCOUNTER — Ambulatory Visit: Payer: Self-pay | Admitting: Internal Medicine

## 2008-07-29 DIAGNOSIS — R1084 Generalized abdominal pain: Secondary | ICD-10-CM | POA: Insufficient documentation

## 2008-07-29 DIAGNOSIS — Z91011 Allergy to milk products: Secondary | ICD-10-CM

## 2008-07-29 DIAGNOSIS — R141 Gas pain: Secondary | ICD-10-CM | POA: Insufficient documentation

## 2008-07-29 DIAGNOSIS — R143 Flatulence: Secondary | ICD-10-CM

## 2008-07-29 DIAGNOSIS — R142 Eructation: Secondary | ICD-10-CM

## 2008-07-29 DIAGNOSIS — D649 Anemia, unspecified: Secondary | ICD-10-CM | POA: Insufficient documentation

## 2008-07-29 DIAGNOSIS — K219 Gastro-esophageal reflux disease without esophagitis: Secondary | ICD-10-CM | POA: Insufficient documentation

## 2008-07-30 ENCOUNTER — Ambulatory Visit: Payer: Self-pay | Admitting: Internal Medicine

## 2008-07-30 ENCOUNTER — Encounter: Payer: Self-pay | Admitting: Gastroenterology

## 2008-08-03 ENCOUNTER — Telehealth (INDEPENDENT_AMBULATORY_CARE_PROVIDER_SITE_OTHER): Payer: Self-pay

## 2008-08-03 ENCOUNTER — Encounter: Payer: Self-pay | Admitting: Internal Medicine

## 2008-08-03 ENCOUNTER — Encounter: Payer: Self-pay | Admitting: Gastroenterology

## 2008-08-03 ENCOUNTER — Emergency Department (HOSPITAL_COMMUNITY): Admission: EM | Admit: 2008-08-03 | Discharge: 2008-08-03 | Payer: Self-pay | Admitting: Emergency Medicine

## 2008-08-03 ENCOUNTER — Telehealth: Payer: Self-pay | Admitting: Family Medicine

## 2008-08-03 LAB — CONVERTED CEMR LAB
Ferritin: 70 ng/mL (ref 10–291)
HCT: 36.6 % (ref 36.0–46.0)
Hemoglobin: 10.7 g/dL — ABNORMAL LOW (ref 12.0–15.0)
Iron: 56 ug/dL (ref 42–145)
TIBC: 369 ug/dL (ref 250–470)

## 2008-08-06 ENCOUNTER — Ambulatory Visit: Payer: Self-pay | Admitting: Family Medicine

## 2008-08-06 ENCOUNTER — Ambulatory Visit (HOSPITAL_COMMUNITY): Admission: RE | Admit: 2008-08-06 | Discharge: 2008-08-06 | Payer: Self-pay | Admitting: Family Medicine

## 2008-08-06 ENCOUNTER — Encounter (INDEPENDENT_AMBULATORY_CARE_PROVIDER_SITE_OTHER): Payer: Self-pay

## 2008-08-06 LAB — CONVERTED CEMR LAB: Glucose, Bld: 167 mg/dL

## 2008-08-07 ENCOUNTER — Telehealth: Payer: Self-pay | Admitting: Family Medicine

## 2008-08-08 ENCOUNTER — Telehealth (INDEPENDENT_AMBULATORY_CARE_PROVIDER_SITE_OTHER): Payer: Self-pay | Admitting: Family Medicine

## 2008-08-11 ENCOUNTER — Encounter: Payer: Self-pay | Admitting: Gastroenterology

## 2008-08-13 ENCOUNTER — Encounter: Payer: Self-pay | Admitting: Gastroenterology

## 2008-08-14 ENCOUNTER — Encounter: Payer: Self-pay | Admitting: Gastroenterology

## 2008-08-17 ENCOUNTER — Telehealth: Payer: Self-pay | Admitting: Family Medicine

## 2008-08-18 ENCOUNTER — Telehealth: Payer: Self-pay | Admitting: Family Medicine

## 2008-08-20 ENCOUNTER — Ambulatory Visit: Payer: Self-pay | Admitting: Family Medicine

## 2008-08-21 ENCOUNTER — Encounter: Payer: Self-pay | Admitting: Family Medicine

## 2008-08-21 ENCOUNTER — Encounter: Payer: Self-pay | Admitting: Gastroenterology

## 2008-08-24 ENCOUNTER — Telehealth: Payer: Self-pay | Admitting: Family Medicine

## 2008-08-25 ENCOUNTER — Telehealth: Payer: Self-pay | Admitting: Family Medicine

## 2008-08-27 ENCOUNTER — Encounter: Payer: Self-pay | Admitting: Family Medicine

## 2008-08-31 ENCOUNTER — Inpatient Hospital Stay (HOSPITAL_COMMUNITY): Admission: AD | Admit: 2008-08-31 | Discharge: 2008-09-05 | Payer: Self-pay | Admitting: Oral Surgery

## 2008-09-03 ENCOUNTER — Encounter: Payer: Self-pay | Admitting: Family Medicine

## 2008-09-04 ENCOUNTER — Ambulatory Visit (HOSPITAL_COMMUNITY): Admission: RE | Admit: 2008-09-04 | Discharge: 2008-09-04 | Payer: Self-pay | Admitting: Oral Surgery

## 2008-09-07 ENCOUNTER — Encounter (INDEPENDENT_AMBULATORY_CARE_PROVIDER_SITE_OTHER): Payer: Self-pay

## 2008-09-09 ENCOUNTER — Ambulatory Visit: Payer: Self-pay | Admitting: Family Medicine

## 2008-09-09 LAB — CONVERTED CEMR LAB
Basophils Relative: 0 % (ref 0–1)
Eosinophils Absolute: 0.1 10*3/uL (ref 0.0–0.7)
Hemoglobin: 9.2 g/dL — ABNORMAL LOW (ref 12.0–15.0)
MCHC: 30.3 g/dL (ref 30.0–36.0)
MCV: 71 fL — ABNORMAL LOW (ref 78.0–100.0)
Monocytes Absolute: 0.8 10*3/uL (ref 0.1–1.0)
Monocytes Relative: 8 % (ref 3–12)
RBC: 4.28 M/uL (ref 3.87–5.11)

## 2008-09-10 ENCOUNTER — Telehealth: Payer: Self-pay | Admitting: Family Medicine

## 2008-09-11 ENCOUNTER — Telehealth (INDEPENDENT_AMBULATORY_CARE_PROVIDER_SITE_OTHER): Payer: Self-pay

## 2008-09-11 ENCOUNTER — Telehealth: Payer: Self-pay | Admitting: Family Medicine

## 2008-09-11 ENCOUNTER — Encounter: Payer: Self-pay | Admitting: Family Medicine

## 2008-09-14 ENCOUNTER — Observation Stay (HOSPITAL_COMMUNITY): Admission: EM | Admit: 2008-09-14 | Discharge: 2008-09-16 | Payer: Self-pay | Admitting: Emergency Medicine

## 2008-09-16 ENCOUNTER — Encounter: Payer: Self-pay | Admitting: Internal Medicine

## 2008-09-17 ENCOUNTER — Ambulatory Visit: Payer: Self-pay | Admitting: Family Medicine

## 2008-09-18 ENCOUNTER — Telehealth: Payer: Self-pay | Admitting: Family Medicine

## 2008-09-18 ENCOUNTER — Encounter: Payer: Self-pay | Admitting: Family Medicine

## 2008-09-21 ENCOUNTER — Telehealth: Payer: Self-pay | Admitting: Family Medicine

## 2008-09-21 ENCOUNTER — Encounter: Payer: Self-pay | Admitting: Family Medicine

## 2008-09-22 ENCOUNTER — Telehealth: Payer: Self-pay | Admitting: Family Medicine

## 2008-09-23 ENCOUNTER — Telehealth: Payer: Self-pay | Admitting: Family Medicine

## 2008-09-24 ENCOUNTER — Ambulatory Visit: Payer: Self-pay | Admitting: Orthopedic Surgery

## 2008-09-24 ENCOUNTER — Ambulatory Visit: Payer: Self-pay | Admitting: Family Medicine

## 2008-09-24 DIAGNOSIS — S4350XA Sprain of unspecified acromioclavicular joint, initial encounter: Secondary | ICD-10-CM | POA: Insufficient documentation

## 2008-09-30 ENCOUNTER — Telehealth: Payer: Self-pay | Admitting: Family Medicine

## 2008-10-08 ENCOUNTER — Ambulatory Visit: Payer: Self-pay | Admitting: Family Medicine

## 2008-10-12 ENCOUNTER — Telehealth: Payer: Self-pay | Admitting: Family Medicine

## 2008-10-12 ENCOUNTER — Ambulatory Visit: Payer: Self-pay | Admitting: Family Medicine

## 2008-10-13 ENCOUNTER — Telehealth: Payer: Self-pay | Admitting: Family Medicine

## 2008-10-14 ENCOUNTER — Telehealth: Payer: Self-pay | Admitting: Family Medicine

## 2008-10-15 ENCOUNTER — Telehealth: Payer: Self-pay | Admitting: Family Medicine

## 2008-10-15 ENCOUNTER — Encounter: Payer: Self-pay | Admitting: Family Medicine

## 2008-10-16 ENCOUNTER — Telehealth (INDEPENDENT_AMBULATORY_CARE_PROVIDER_SITE_OTHER): Payer: Self-pay

## 2008-10-20 ENCOUNTER — Encounter: Payer: Self-pay | Admitting: Family Medicine

## 2008-10-20 ENCOUNTER — Telehealth: Payer: Self-pay | Admitting: Family Medicine

## 2008-10-22 ENCOUNTER — Encounter: Payer: Self-pay | Admitting: Family Medicine

## 2008-10-23 ENCOUNTER — Telehealth: Payer: Self-pay | Admitting: Family Medicine

## 2008-10-23 ENCOUNTER — Ambulatory Visit: Payer: Self-pay | Admitting: Family Medicine

## 2008-10-23 LAB — CONVERTED CEMR LAB
Bilirubin Urine: NEGATIVE
Blood in Urine, dipstick: NEGATIVE
Glucose, Urine, Semiquant: NEGATIVE
Protein, U semiquant: NEGATIVE
pH: 5.5

## 2008-10-27 ENCOUNTER — Telehealth: Payer: Self-pay | Admitting: Family Medicine

## 2008-10-30 ENCOUNTER — Encounter: Payer: Self-pay | Admitting: Family Medicine

## 2008-11-02 ENCOUNTER — Telehealth: Payer: Self-pay | Admitting: Family Medicine

## 2008-11-06 ENCOUNTER — Encounter: Payer: Self-pay | Admitting: Family Medicine

## 2008-11-09 ENCOUNTER — Telehealth: Payer: Self-pay | Admitting: Family Medicine

## 2008-11-09 ENCOUNTER — Encounter: Payer: Self-pay | Admitting: Gastroenterology

## 2008-11-10 ENCOUNTER — Telehealth: Payer: Self-pay | Admitting: Family Medicine

## 2008-11-11 ENCOUNTER — Ambulatory Visit: Payer: Self-pay | Admitting: Family Medicine

## 2008-11-11 ENCOUNTER — Telehealth: Payer: Self-pay | Admitting: Family Medicine

## 2008-11-17 IMAGING — US US RENAL
1 series · 14 of 25 positions shown · non-contrast
Comparison: none

HISTORY: Left flank pain

[Series 1: us renal · 0.37mm/px · 14 of 28 slices shown]
[im 1/28]
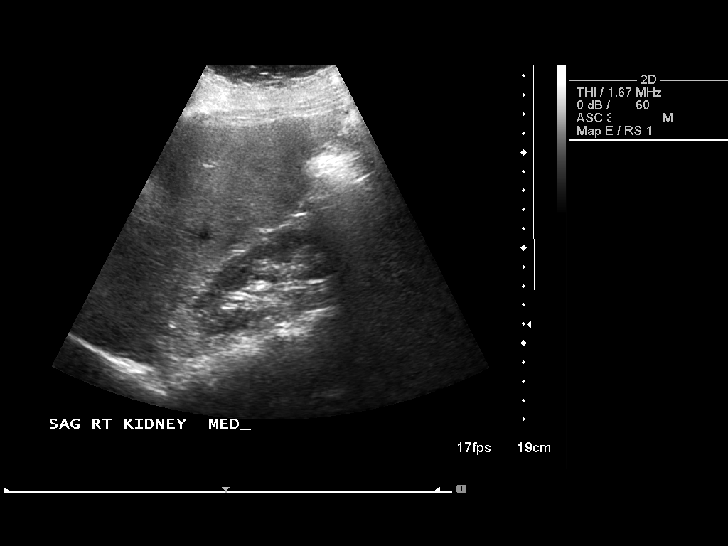
[im 3/28]
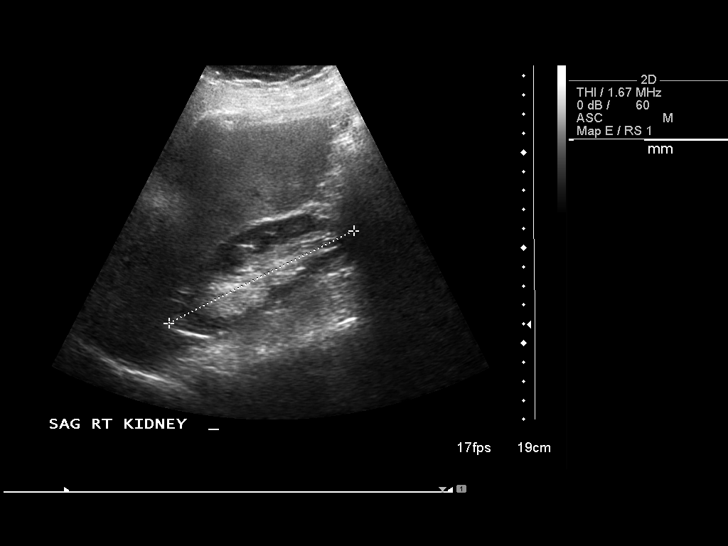
[im 5/28]
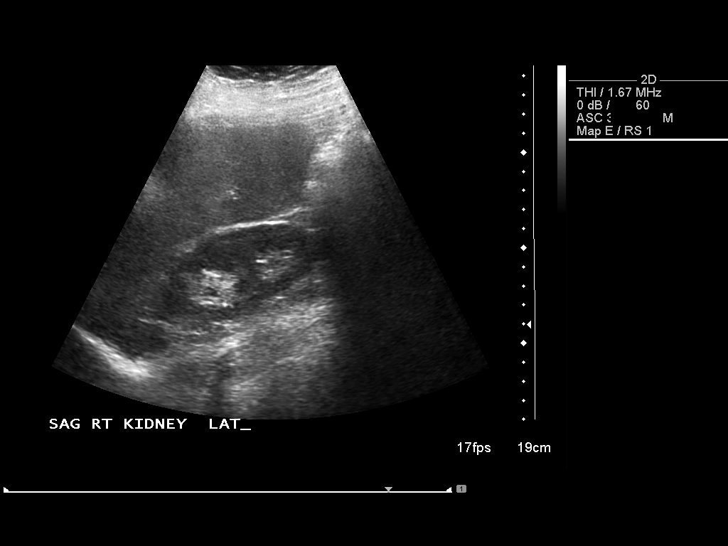
[im 7/28]
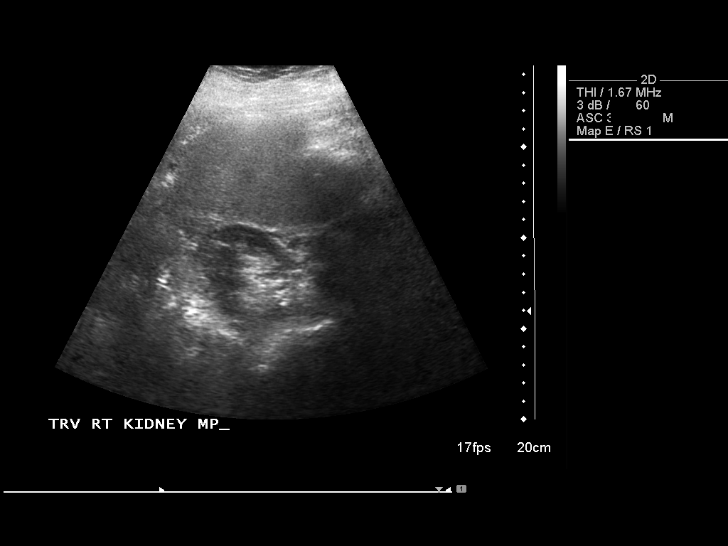
[im 10/28]
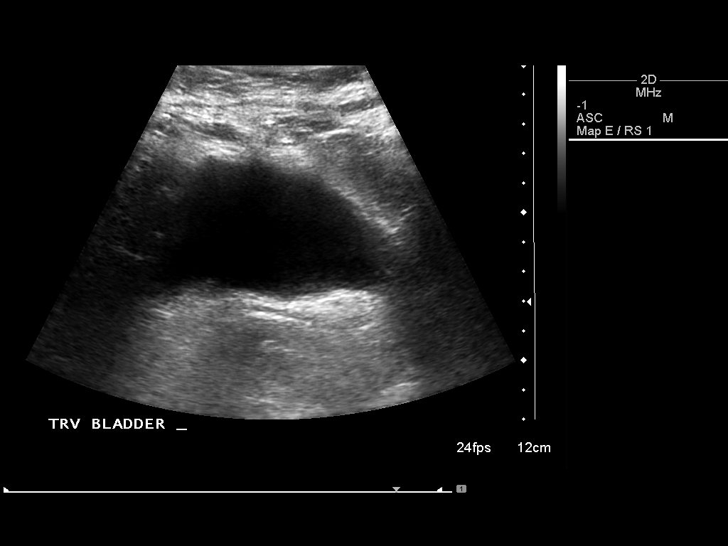
[im 11/28]
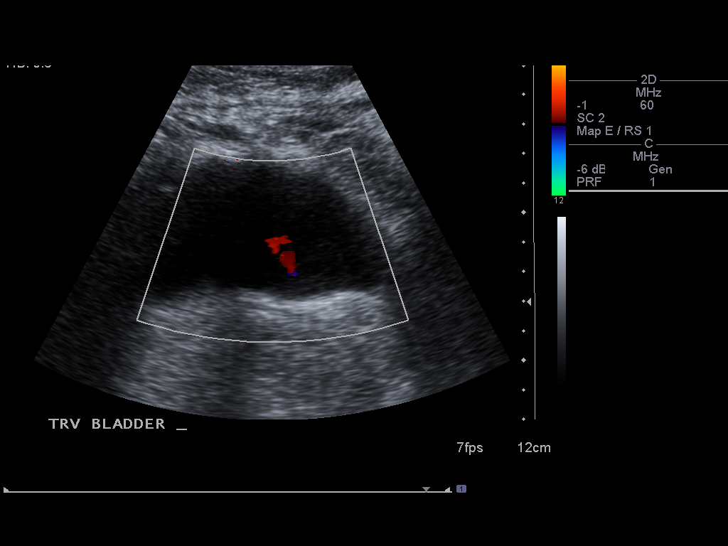
[im 13/28]
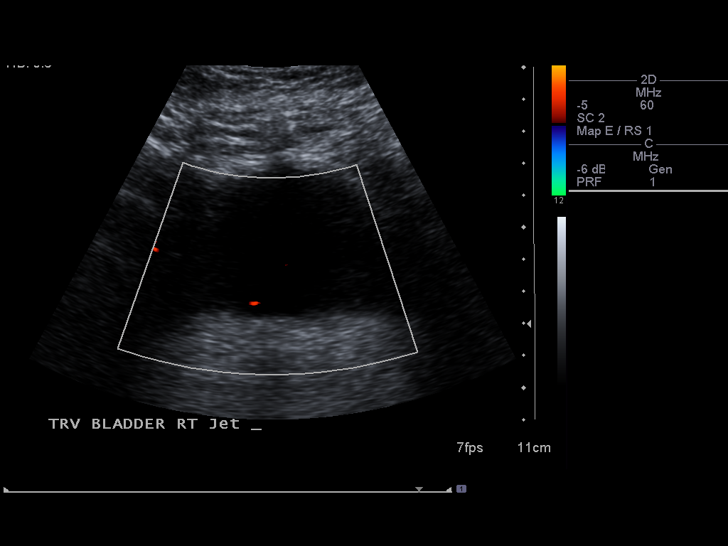
[im 15/28]
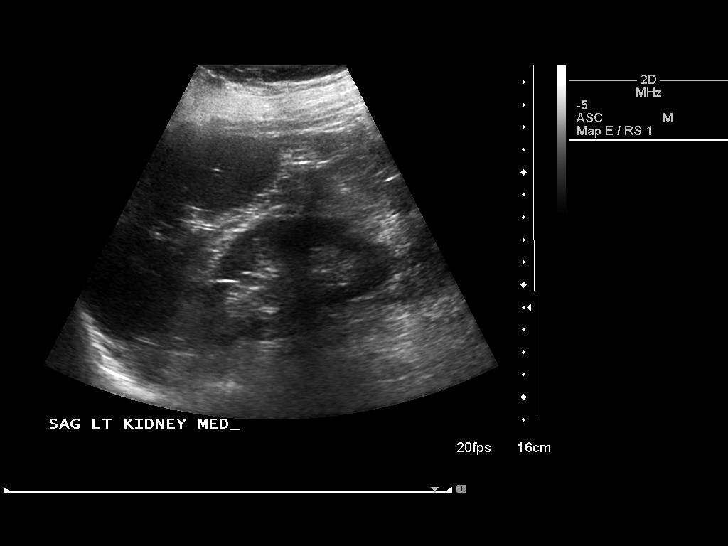
[im 17/28]
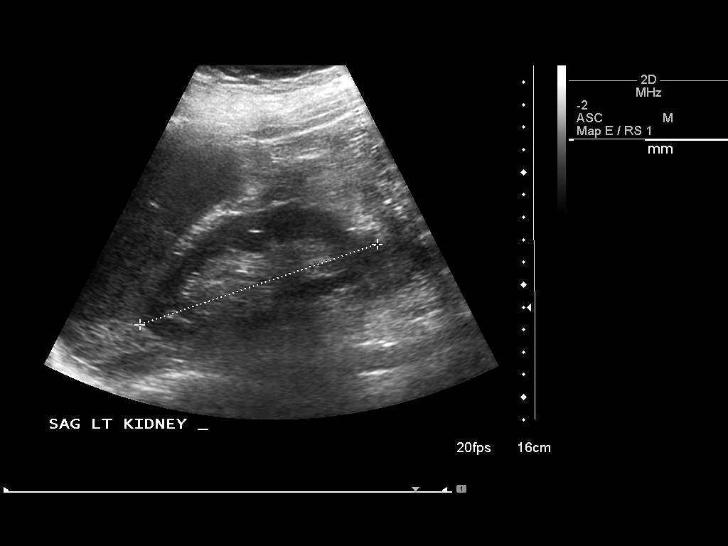
[im 19/28]
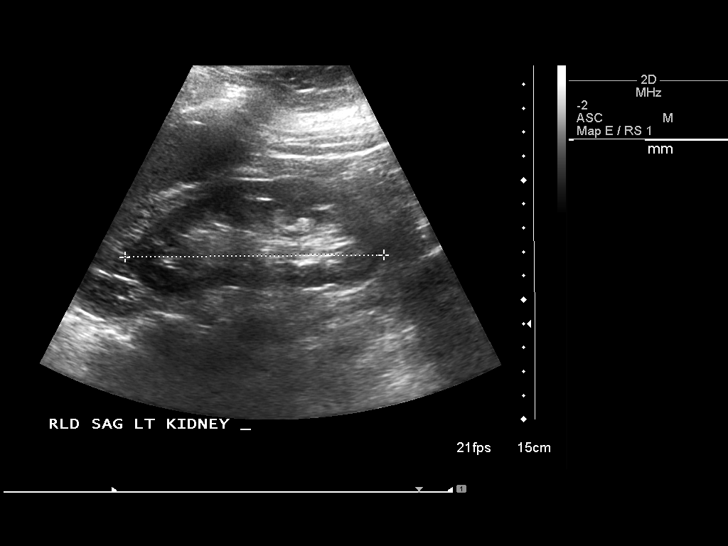
[im 21/28]
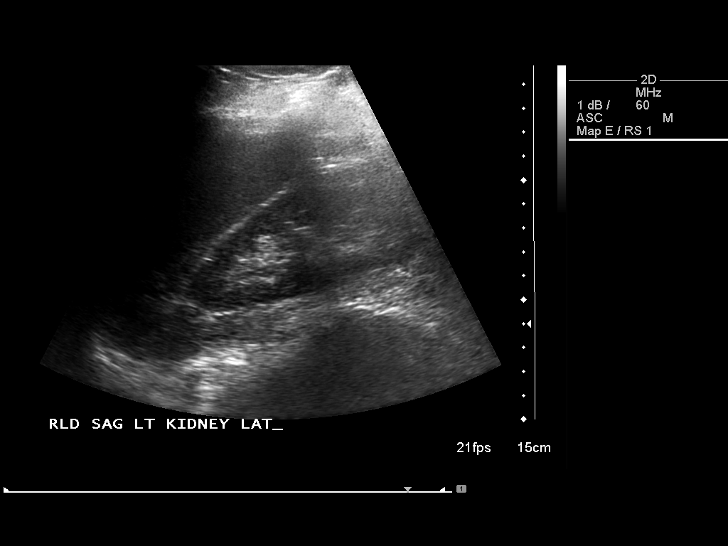
[im 23/28]
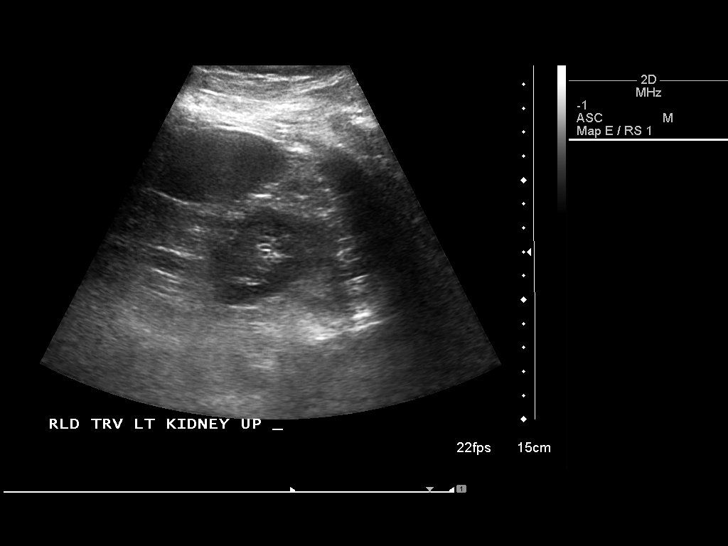
[im 25/28]
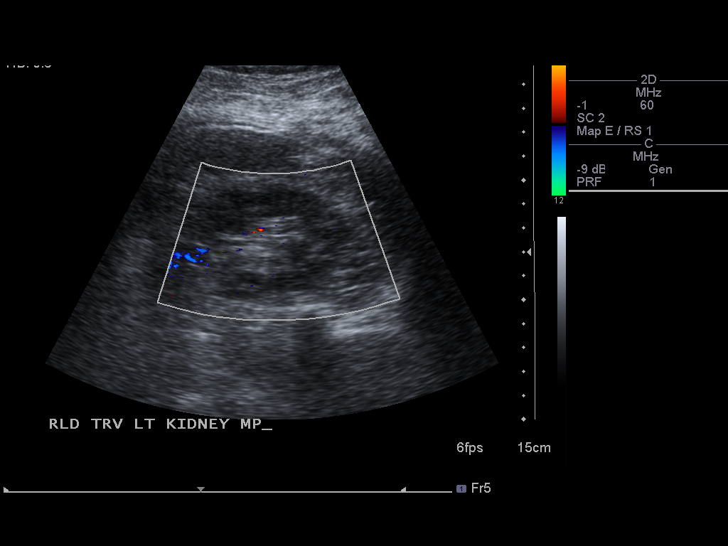
[im 28/28]
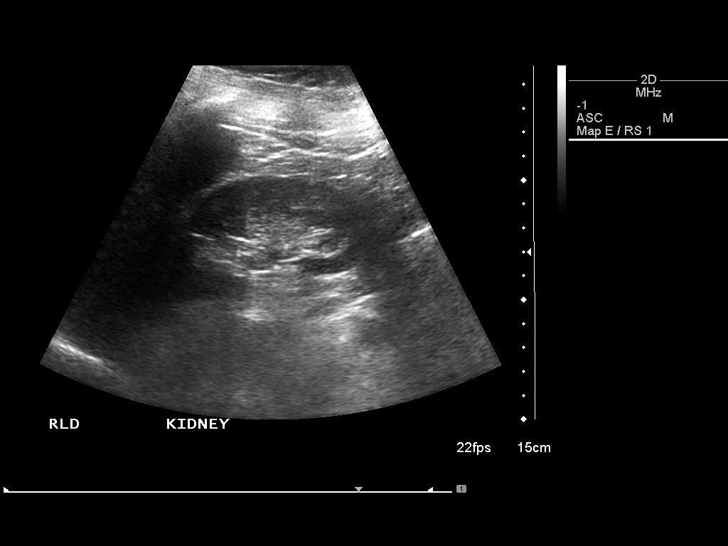

[14 of 25 positions shown; findings below may reference images not displayed]

RENAL ULTRASOUND:

Sonography of the kidneys and urinary bladder performed.

Kidneys measure 10.8 cm length right and 11.5 cm length left.
Mild renal cortical thinning bilaterally.
No renal mass, hydronephrosis, or shadowing dislocation.
Scattered tiny foci of increased echogenicity are seen within the kidneys
without shadowing, located within parenchyma, uncertain significance.
No medullary calcinosis or calculi are seen on preceding CT of 05/03/2006.
No perinephric fluid.
Bladder unremarkable.
Right ureteral jet is less well visualized.
IMPRESSION: No definite acute renal abnormalities as above.

## 2008-11-18 ENCOUNTER — Telehealth: Payer: Self-pay | Admitting: Family Medicine

## 2008-11-19 ENCOUNTER — Telehealth: Payer: Self-pay | Admitting: Family Medicine

## 2008-11-20 ENCOUNTER — Encounter: Payer: Self-pay | Admitting: Family Medicine

## 2008-11-21 ENCOUNTER — Telehealth (INDEPENDENT_AMBULATORY_CARE_PROVIDER_SITE_OTHER): Payer: Self-pay | Admitting: Family Medicine

## 2008-11-24 ENCOUNTER — Telehealth: Payer: Self-pay | Admitting: Family Medicine

## 2008-11-24 ENCOUNTER — Encounter: Payer: Self-pay | Admitting: Family Medicine

## 2008-11-30 ENCOUNTER — Telehealth: Payer: Self-pay | Admitting: Family Medicine

## 2008-12-02 ENCOUNTER — Telehealth: Payer: Self-pay | Admitting: Family Medicine

## 2008-12-02 ENCOUNTER — Encounter: Payer: Self-pay | Admitting: Family Medicine

## 2008-12-04 ENCOUNTER — Telehealth: Payer: Self-pay | Admitting: Family Medicine

## 2008-12-07 ENCOUNTER — Encounter: Payer: Self-pay | Admitting: Orthopedic Surgery

## 2008-12-09 ENCOUNTER — Telehealth (INDEPENDENT_AMBULATORY_CARE_PROVIDER_SITE_OTHER): Payer: Self-pay

## 2008-12-09 ENCOUNTER — Encounter: Payer: Self-pay | Admitting: Orthopedic Surgery

## 2008-12-09 ENCOUNTER — Ambulatory Visit: Payer: Self-pay | Admitting: Internal Medicine

## 2008-12-11 ENCOUNTER — Ambulatory Visit: Payer: Self-pay | Admitting: Family Medicine

## 2008-12-11 DIAGNOSIS — E785 Hyperlipidemia, unspecified: Secondary | ICD-10-CM

## 2008-12-11 DIAGNOSIS — Z8719 Personal history of other diseases of the digestive system: Secondary | ICD-10-CM | POA: Insufficient documentation

## 2008-12-11 DIAGNOSIS — E782 Mixed hyperlipidemia: Secondary | ICD-10-CM | POA: Insufficient documentation

## 2008-12-11 DIAGNOSIS — K589 Irritable bowel syndrome without diarrhea: Secondary | ICD-10-CM | POA: Insufficient documentation

## 2008-12-11 LAB — CONVERTED CEMR LAB
Glucose, Bld: 232 mg/dL
Hgb A1c MFr Bld: 7 %

## 2008-12-21 ENCOUNTER — Telehealth: Payer: Self-pay | Admitting: Family Medicine

## 2008-12-21 ENCOUNTER — Encounter: Payer: Self-pay | Admitting: Family Medicine

## 2008-12-23 ENCOUNTER — Telehealth: Payer: Self-pay | Admitting: Family Medicine

## 2008-12-24 ENCOUNTER — Ambulatory Visit (HOSPITAL_COMMUNITY)
Admission: RE | Admit: 2008-12-24 | Discharge: 2008-12-24 | Payer: Self-pay | Admitting: Physical Medicine and Rehabilitation

## 2008-12-28 ENCOUNTER — Telehealth: Payer: Self-pay | Admitting: Family Medicine

## 2008-12-28 LAB — CONVERTED CEMR LAB
ALT: 15 units/L (ref 0–35)
AST: 19 units/L (ref 0–37)
Albumin: 4.2 g/dL (ref 3.5–5.2)
Alkaline Phosphatase: 142 units/L — ABNORMAL HIGH (ref 39–117)
Basophils Relative: 0 % (ref 0–1)
CO2: 20 meq/L (ref 19–32)
Calcium: 8.8 mg/dL (ref 8.4–10.5)
Cholesterol: 190 mg/dL (ref 0–200)
HDL: 46 mg/dL (ref >39)
MCHC: 29.2 g/dL — ABNORMAL LOW (ref 30.0–36.0)
Monocytes Relative: 9 % (ref 3–12)
Neutro Abs: 3.1 10*3/uL (ref 1.7–7.7)
Neutrophils Relative %: 45 % (ref 43–77)
Platelets: 211 10*3/uL (ref 150–400)
RBC: 4.58 M/uL (ref 3.87–5.11)
Sodium: 138 meq/L (ref 135–145)
Total CHOL/HDL Ratio: 4.1
Total Protein: 7.6 g/dL (ref 6.0–8.3)
Triglycerides: 192 mg/dL — ABNORMAL HIGH (ref <150)
WBC: 7 10*3/uL (ref 4.0–10.5)

## 2009-01-18 IMAGING — CR DG CHEST 2V
2 series · 2 of 2 positions shown · non-contrast
Comparison: 06/12/2006 thoracic spine radiographs, chest radiograph
05/01/2006

CLINICAL DATA: Imbalance, fall, left anterior chest pain

CHEST - 2 VIEW

[view not recorded (1 of 2)]
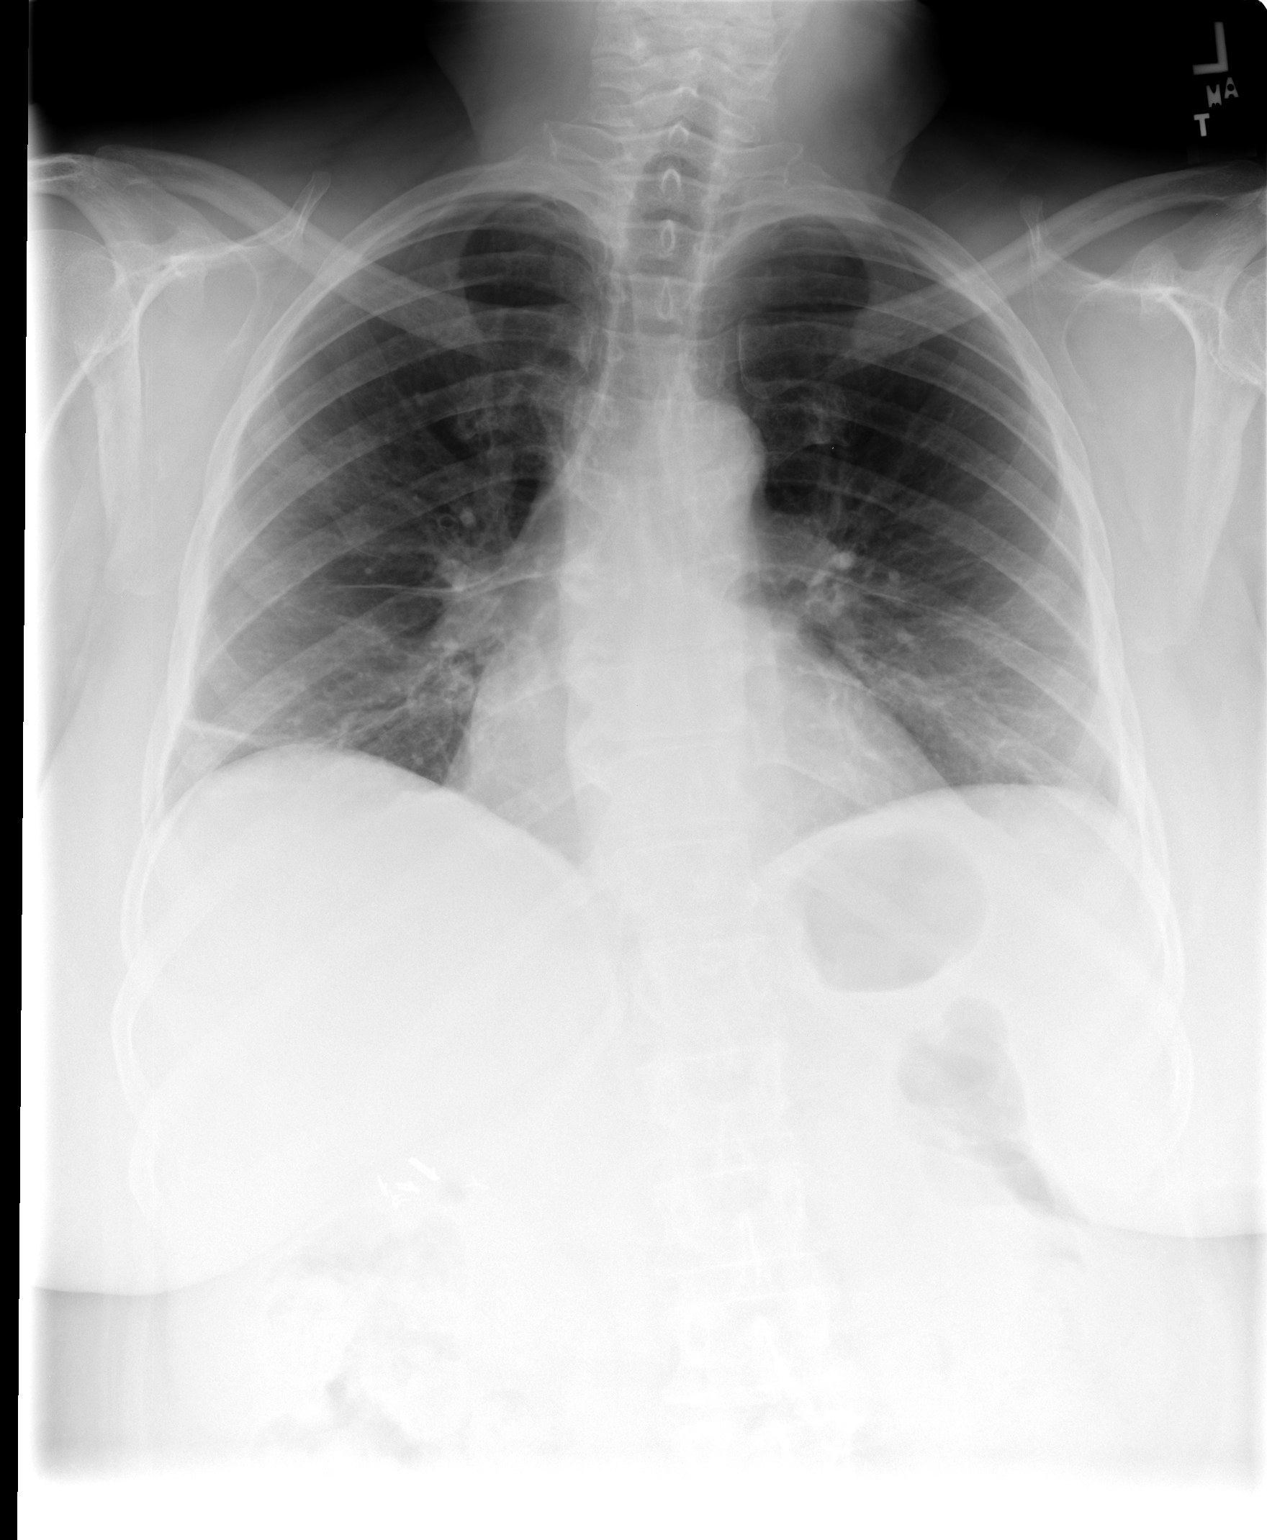

[view not recorded (2 of 2)]
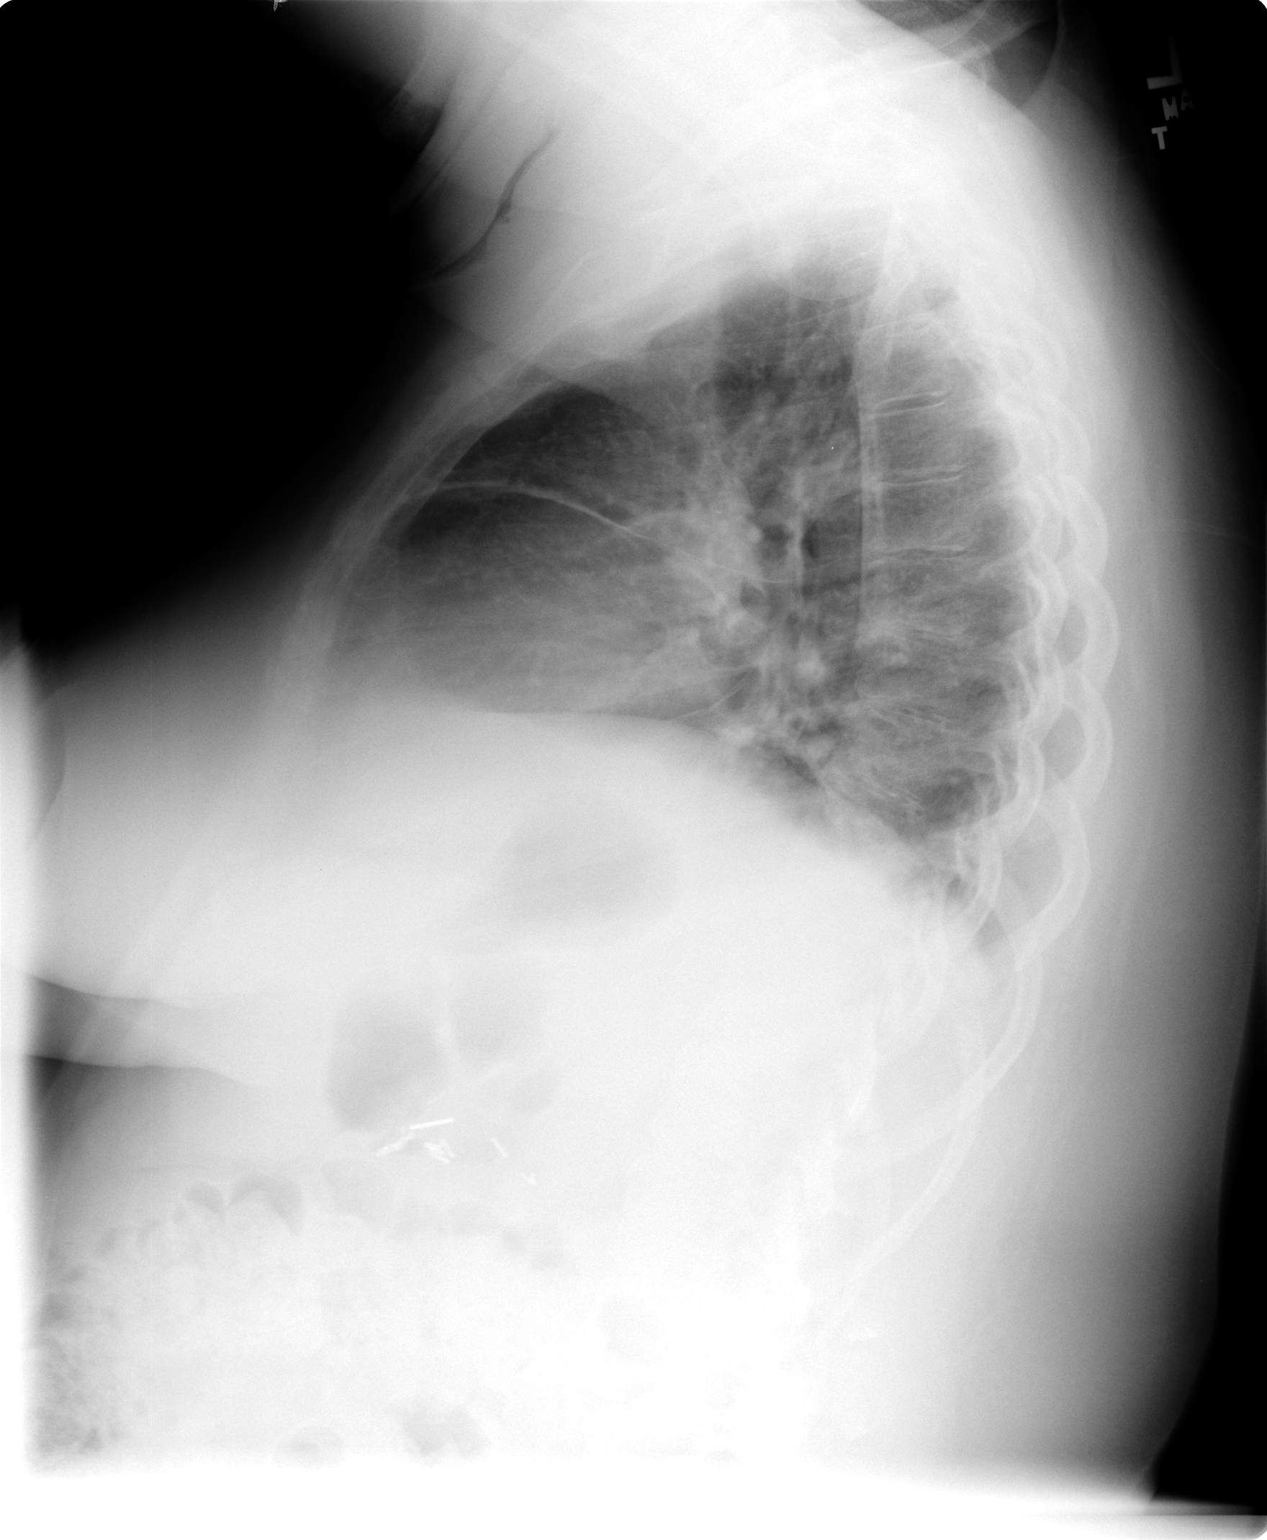

[2 of 2 positions shown; findings below may reference images not displayed]

FINDINGS: Borderline cardiac enlargement.
Normal mediastinal contours and pulmonary vascularity.
Bronchitic changes with bibasilar atelectasis.
No definite infiltrate, pleural effusion, or pneumothorax.
Surgical clips right upper quadrant question cholecystectomy.
No definite rib fractures.
IMPRESSION: Decreased lung volumes with bibasilar atelectasis.
Mild bronchitic changes.

## 2009-01-19 ENCOUNTER — Telehealth: Payer: Self-pay | Admitting: Family Medicine

## 2009-01-20 ENCOUNTER — Telehealth: Payer: Self-pay | Admitting: Family Medicine

## 2009-01-20 ENCOUNTER — Encounter: Payer: Self-pay | Admitting: Gastroenterology

## 2009-01-20 ENCOUNTER — Encounter: Payer: Self-pay | Admitting: Family Medicine

## 2009-01-22 ENCOUNTER — Ambulatory Visit: Payer: Self-pay | Admitting: Family Medicine

## 2009-01-22 LAB — CONVERTED CEMR LAB: Glucose, Bld: 169 mg/dL

## 2009-01-25 ENCOUNTER — Encounter: Admission: RE | Admit: 2009-01-25 | Discharge: 2009-01-25 | Payer: Self-pay | Admitting: Family Medicine

## 2009-01-28 ENCOUNTER — Encounter: Payer: Self-pay | Admitting: Family Medicine

## 2009-01-28 ENCOUNTER — Telehealth: Payer: Self-pay | Admitting: Family Medicine

## 2009-02-01 ENCOUNTER — Encounter: Payer: Self-pay | Admitting: Family Medicine

## 2009-02-02 ENCOUNTER — Telehealth: Payer: Self-pay | Admitting: Family Medicine

## 2009-02-05 ENCOUNTER — Encounter: Admission: RE | Admit: 2009-02-05 | Discharge: 2009-02-05 | Payer: Self-pay | Admitting: Family Medicine

## 2009-02-11 ENCOUNTER — Telehealth: Payer: Self-pay | Admitting: Family Medicine

## 2009-02-16 ENCOUNTER — Encounter: Payer: Self-pay | Admitting: Family Medicine

## 2009-02-22 ENCOUNTER — Encounter: Payer: Self-pay | Admitting: Family Medicine

## 2009-02-22 ENCOUNTER — Ambulatory Visit: Payer: Self-pay | Admitting: Gastroenterology

## 2009-02-22 DIAGNOSIS — Z8639 Personal history of other endocrine, nutritional and metabolic disease: Secondary | ICD-10-CM | POA: Insufficient documentation

## 2009-02-22 DIAGNOSIS — Z862 Personal history of diseases of the blood and blood-forming organs and certain disorders involving the immune mechanism: Secondary | ICD-10-CM

## 2009-02-24 ENCOUNTER — Encounter: Payer: Self-pay | Admitting: Internal Medicine

## 2009-03-04 ENCOUNTER — Telehealth: Payer: Self-pay | Admitting: Family Medicine

## 2009-03-05 ENCOUNTER — Emergency Department (HOSPITAL_COMMUNITY): Admission: EM | Admit: 2009-03-05 | Discharge: 2009-03-05 | Payer: Self-pay | Admitting: Emergency Medicine

## 2009-03-08 ENCOUNTER — Telehealth: Payer: Self-pay | Admitting: Family Medicine

## 2009-03-09 ENCOUNTER — Telehealth: Payer: Self-pay | Admitting: Family Medicine

## 2009-03-10 ENCOUNTER — Telehealth: Payer: Self-pay | Admitting: Family Medicine

## 2009-03-12 ENCOUNTER — Emergency Department (HOSPITAL_COMMUNITY): Admission: EM | Admit: 2009-03-12 | Discharge: 2009-03-12 | Payer: Self-pay | Admitting: Emergency Medicine

## 2009-03-15 ENCOUNTER — Telehealth: Payer: Self-pay | Admitting: Family Medicine

## 2009-03-15 ENCOUNTER — Ambulatory Visit (HOSPITAL_COMMUNITY): Admission: RE | Admit: 2009-03-15 | Discharge: 2009-03-15 | Payer: Self-pay | Admitting: Internal Medicine

## 2009-03-16 ENCOUNTER — Encounter: Payer: Self-pay | Admitting: Internal Medicine

## 2009-03-17 ENCOUNTER — Encounter (INDEPENDENT_AMBULATORY_CARE_PROVIDER_SITE_OTHER): Payer: Self-pay

## 2009-03-17 IMAGING — CR DG CHEST 1V
1 series · 1 of 1 positions shown · non-contrast
Comparison: 06/26/2007.

CLINICAL DATA: Short of breath.  Weakness.

CHEST - 1 VIEW

[view not recorded]
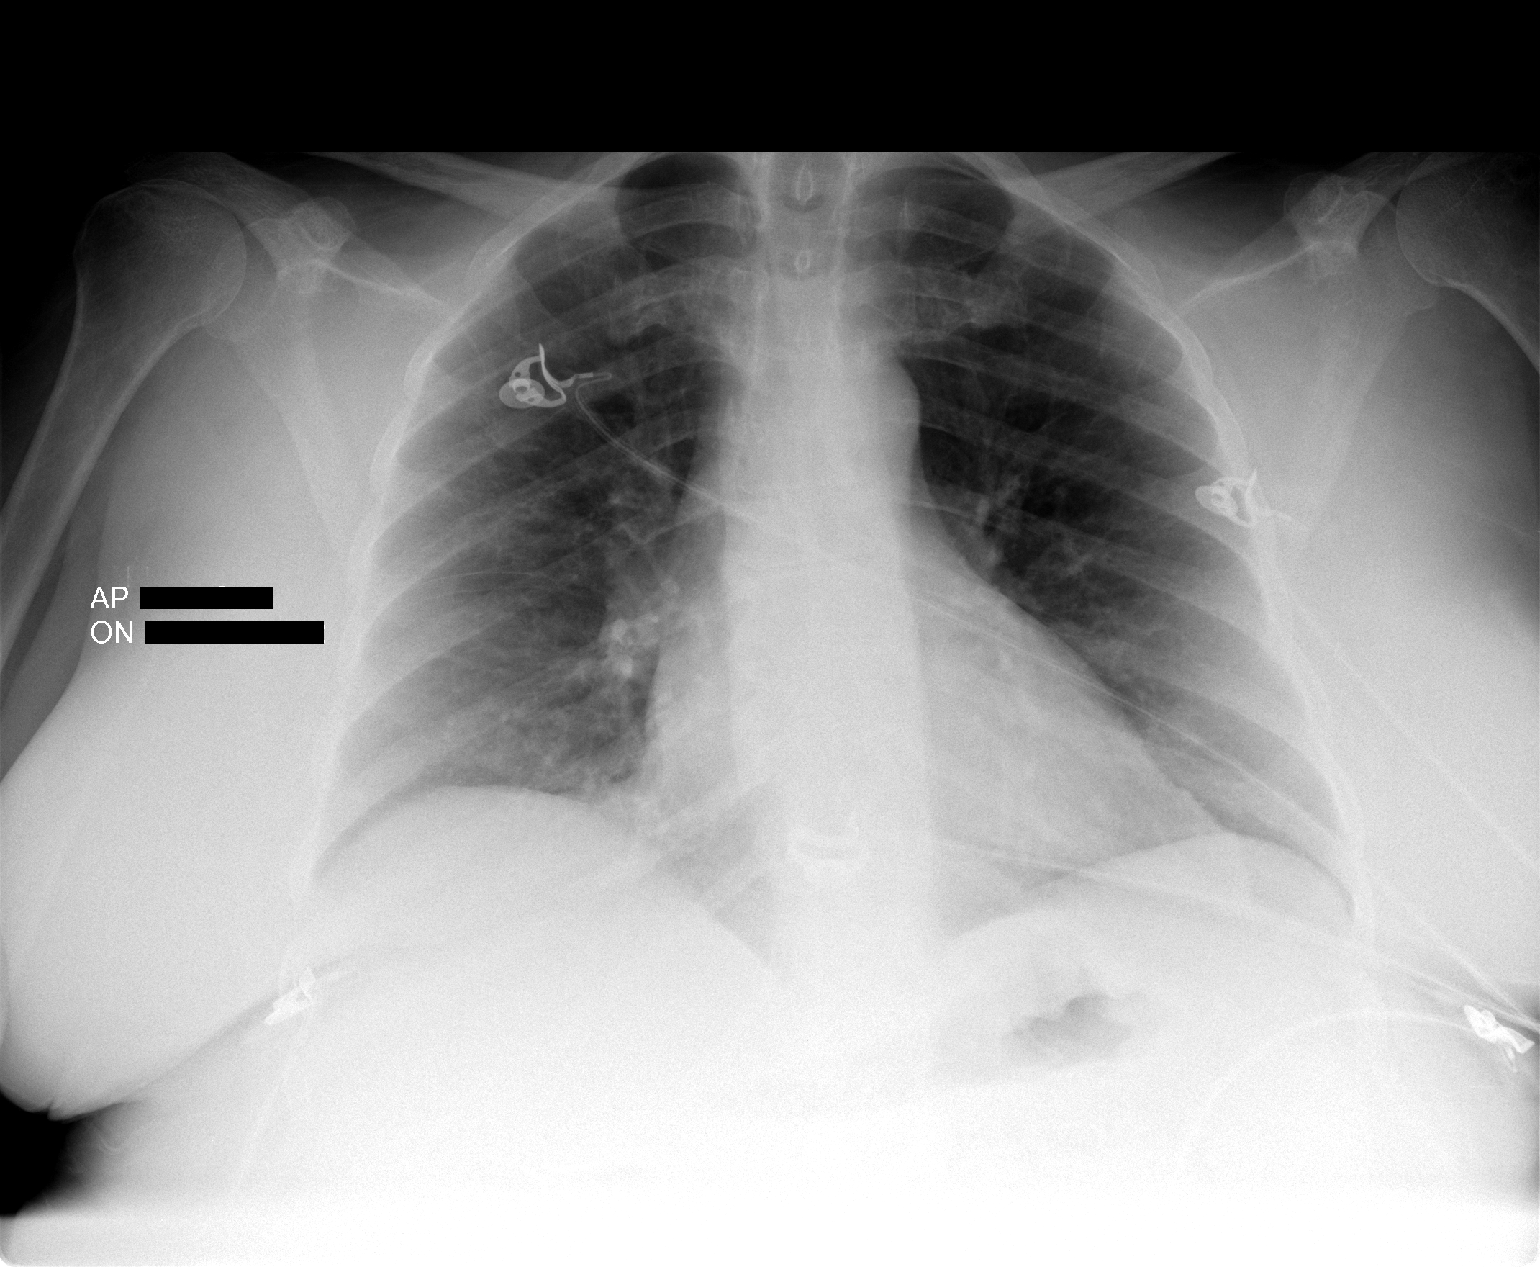

[1 of 1 positions shown; findings below may reference images not displayed]

FINDINGS: No infiltrate, congestive heart failure or pneumothorax.
Heart size top normal. Minimal peribronchial thickening.
IMPRESSION: Heart size top normal.

No infiltrate or congestive heart failure.

Minimal peribronchial thickening.  Significance indeterminate.

## 2009-03-18 ENCOUNTER — Ambulatory Visit: Payer: Self-pay | Admitting: Family Medicine

## 2009-03-18 LAB — CONVERTED CEMR LAB
Bilirubin, Direct: 0.1 mg/dL (ref 0.0–0.3)
Indirect Bilirubin: 0.2 mg/dL (ref 0.0–0.9)
LDL Cholesterol: 69 mg/dL (ref 0–99)
TSH: 1.126 microintl units/mL (ref 0.350–4.500)
Total CHOL/HDL Ratio: 2.9
VLDL: 20 mg/dL (ref 0–40)

## 2009-03-18 IMAGING — CT CT ANGIO CHEST
1 of 2 series · 19 of 32 positions shown · IV contrast (Omnipaque 300)
Comparison: None

CLINICAL DATA: Dyspnea

CT ANGIOGRAPHY CHEST
TECHNIQUE: Multidetector CT imaging of the chest using the
standard protocol during bolus administration of intravenous
contrast. Multiplanar reconstructed images obtained and reviewed to
evaluate the vascular anatomy.
Contrast: 80 ml Omnipaque 350

[Series 16: thin pacs · axial · 0.69mm/px · z∈[+1512,+1790]mm · 19 of 310 slices shown]
[im 16/310  lung]
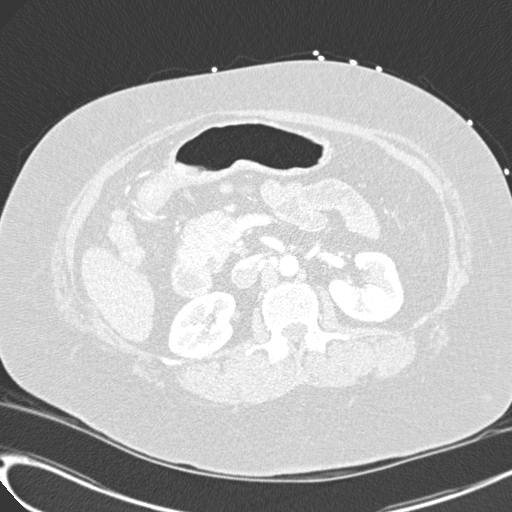
[im 31/310  mediastinal]
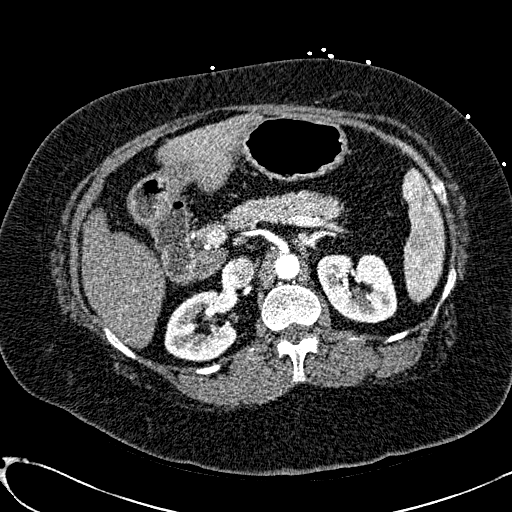
[im 47/310  lung]
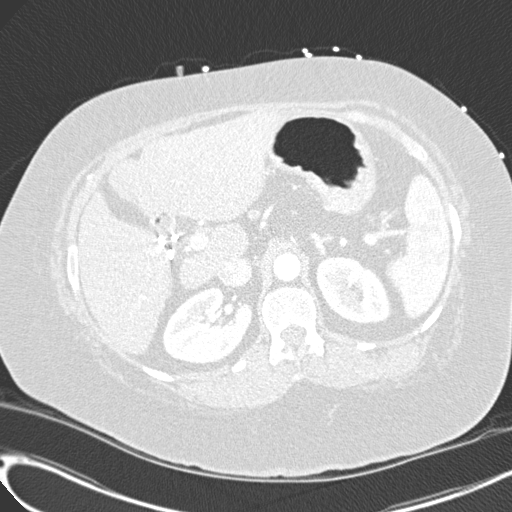
[im 78/310  mediastinal]
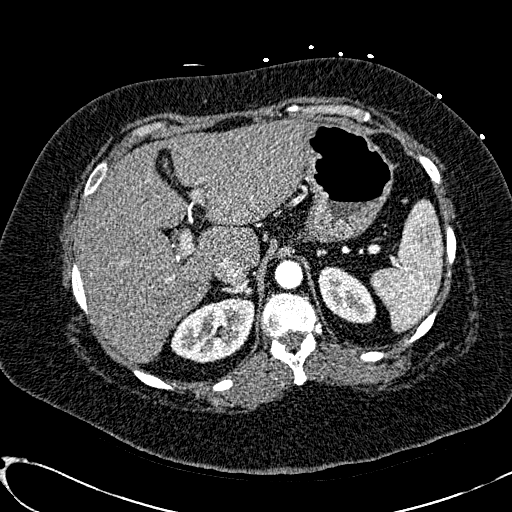
[im 93/310  lung]
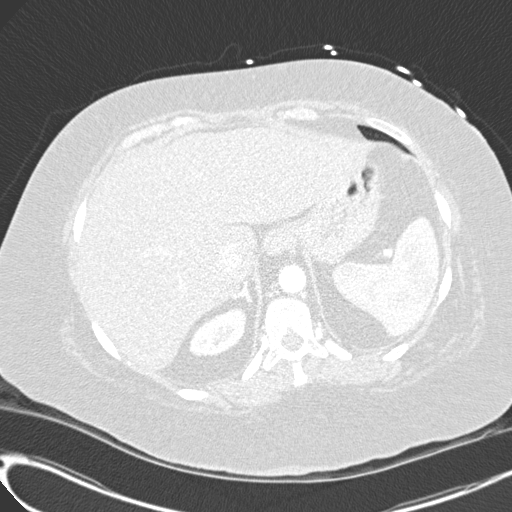
[im 104/310  mediastinal]
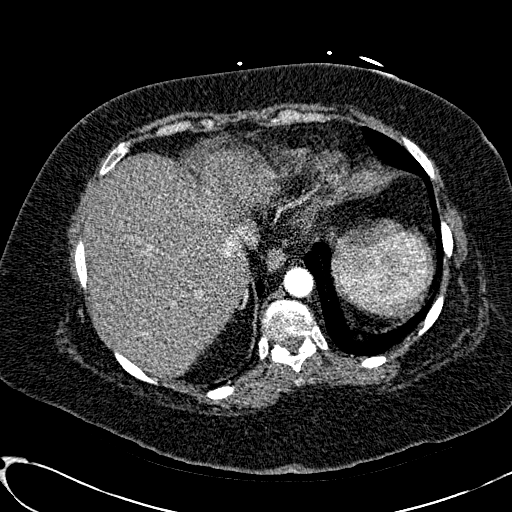
[im 109/310  lung]
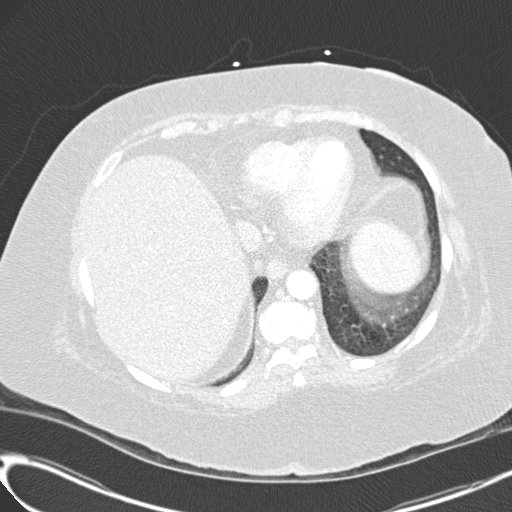
[im 124/310  mediastinal]
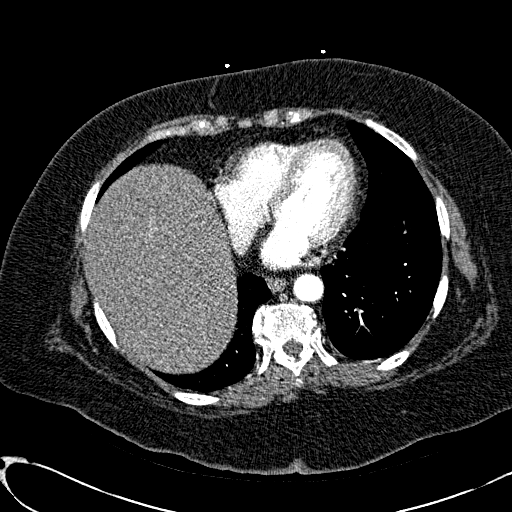
[im 140/310  lung]
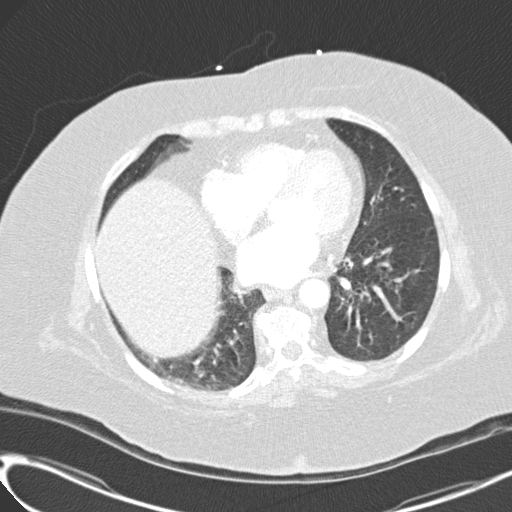
[im 155/310  mediastinal]
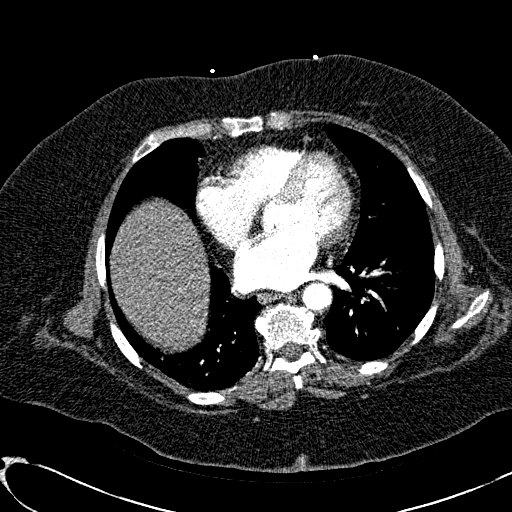
[im 170/310  lung]
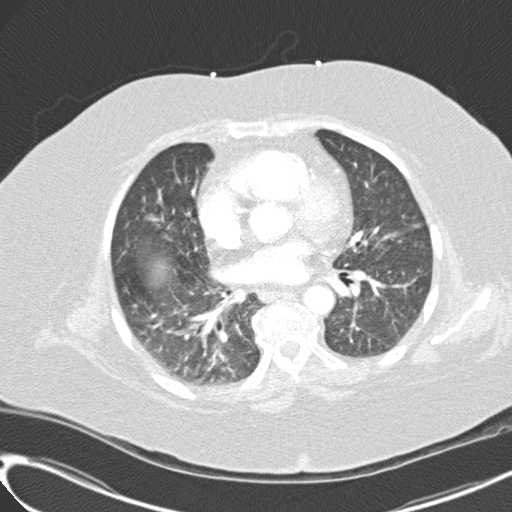
[im 186/310  mediastinal]
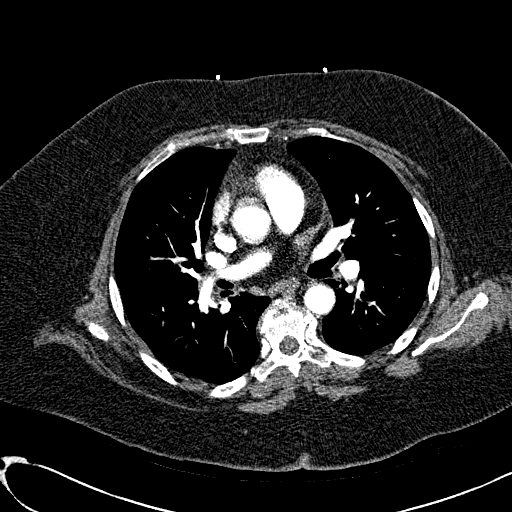
[im 201/310  lung]
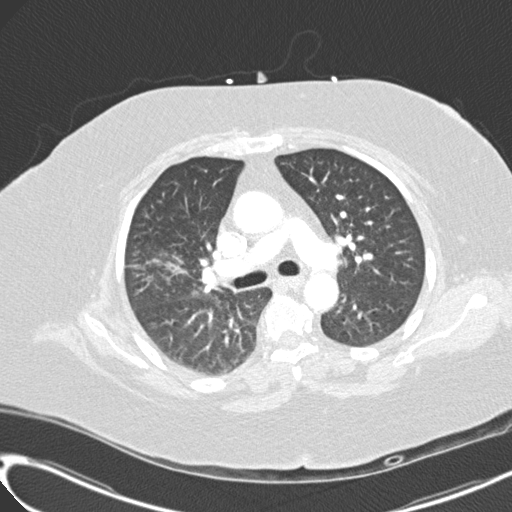
[im 207/310  mediastinal]
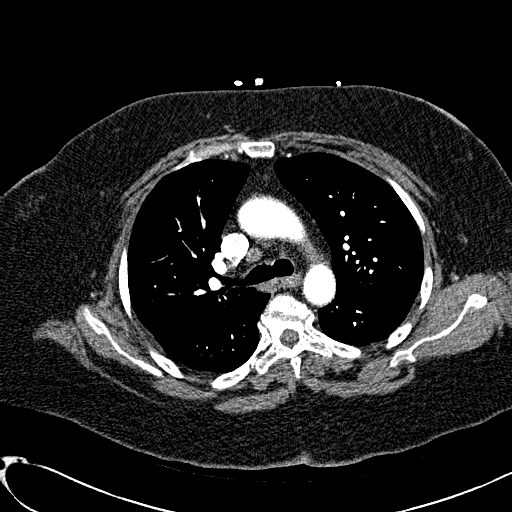
[im 217/310  lung]
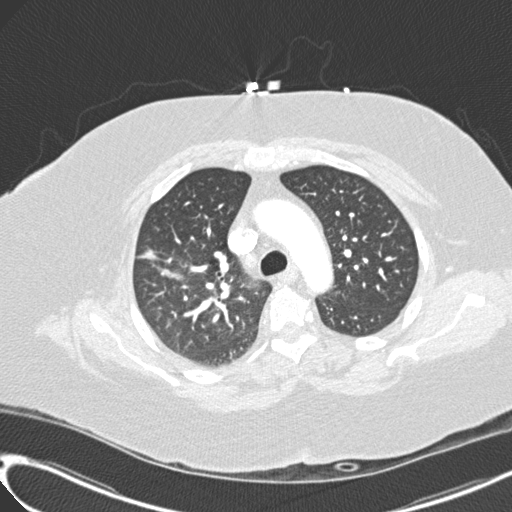
[im 232/310  mediastinal]
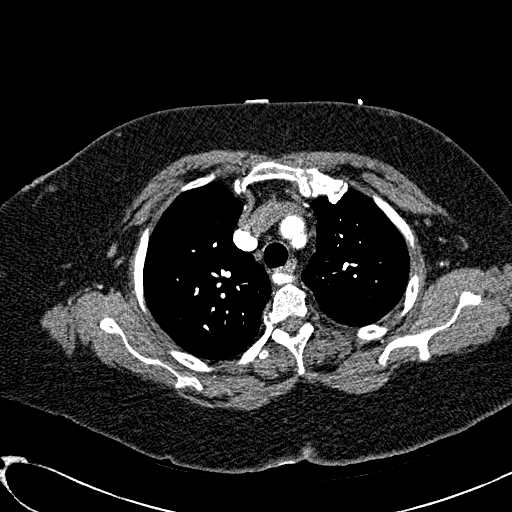
[im 263/310  lung]
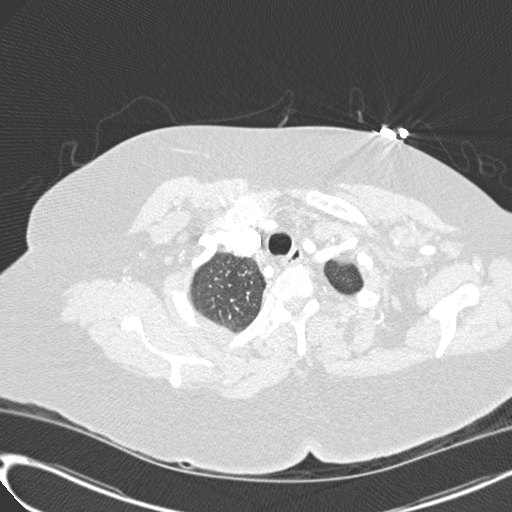
[im 279/310  mediastinal]
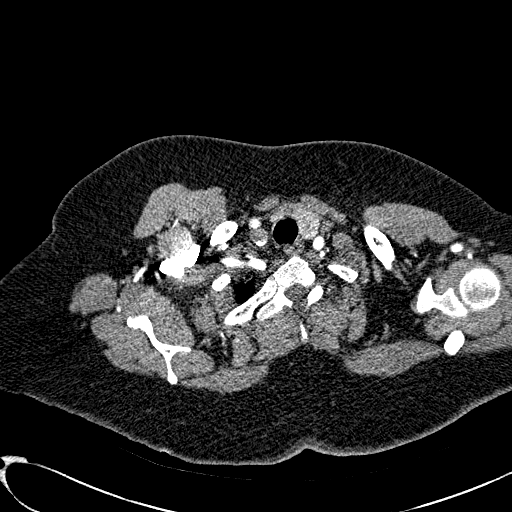
[im 294/310  lung]
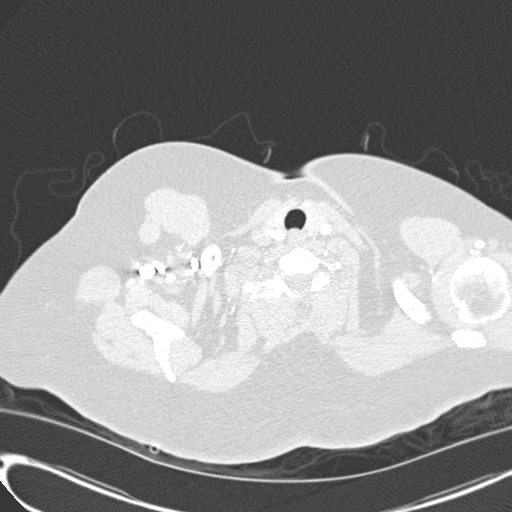

[19 of 32 positions shown; findings below may reference images not displayed]

FINDINGS: Nonspecific nodule inferior pole left thyroid lobe, 1.8 x 1.5 cm
image 12.
Aorta normal caliber without aneurysm or dissection.
Status post cholecystectomy.
Visualized portions of upper abdomen unremarkable.

Pulmonary arteries patent.
No evidence pulmonary embolism.
Subsegmental atelectasis, right lower lobe.
Additional mild atelectasis right upper lobe with question focus of
infiltrate.
Remaining lungs clear.
No pulmonary mass or nodule nor evidence of pleural effusion.
IMPRESSION: No evidence of pulmonary embolism.
Scattered atelectasis with small focus of right upper lobe
infiltrate.
Left thyroid nodule 1.8 x 1.5 cm, consider routine follow-up
thyroid ultrasound.

## 2009-03-19 ENCOUNTER — Encounter: Payer: Self-pay | Admitting: Gastroenterology

## 2009-03-19 ENCOUNTER — Telehealth: Payer: Self-pay | Admitting: Family Medicine

## 2009-03-19 ENCOUNTER — Telehealth (INDEPENDENT_AMBULATORY_CARE_PROVIDER_SITE_OTHER): Payer: Self-pay

## 2009-03-20 IMAGING — CT CT PELVIS W/ CM
1 of 3 series · 14 of 32 positions shown, 19 images · IV contrast (agent unspecified)
Comparison: CT on 05/03/2006

CT ABDOMEN

CLINICAL DATA: Abdominal pain.  History of pancreatitis.
Cholecystectomy.

CT ABDOMEN AND PELVIS WITH CONTRAST
TECHNIQUE: Multidetector CT imaging of the abdomen and pelvis was
performed using the standard protocol following bolus
administration of intravenous contrast.
Contrast: 100 ml 5mnipaque-N55 IV

[Series 2: abd_pel 5.0 b40f · axial · 0.73mm/px · z∈[+412,+812]mm · 14 of 92 slices shown, 19 images]
[im 6/92  soft-tissue]
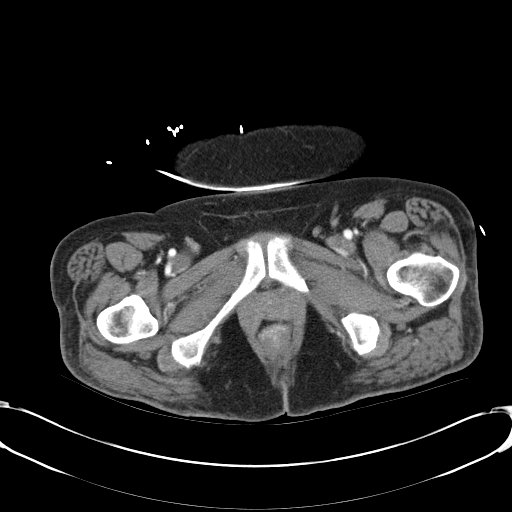
[im 6/92  bone]
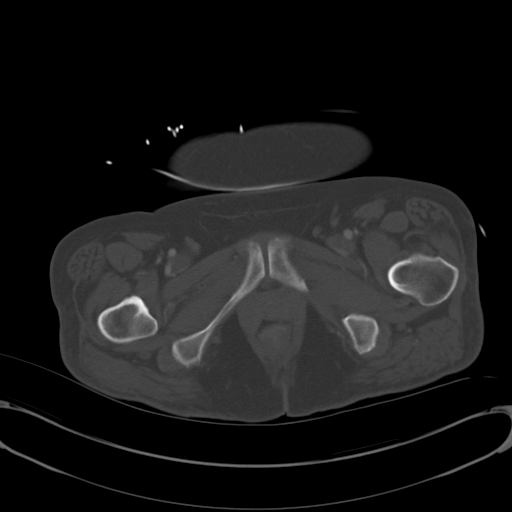
[im 11/92  soft-tissue]
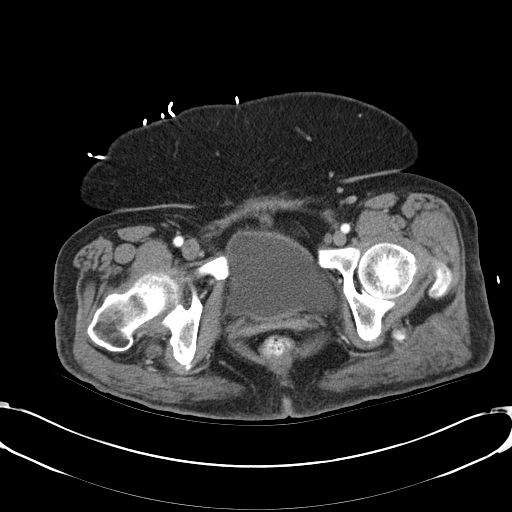
[im 22/92  soft-tissue]
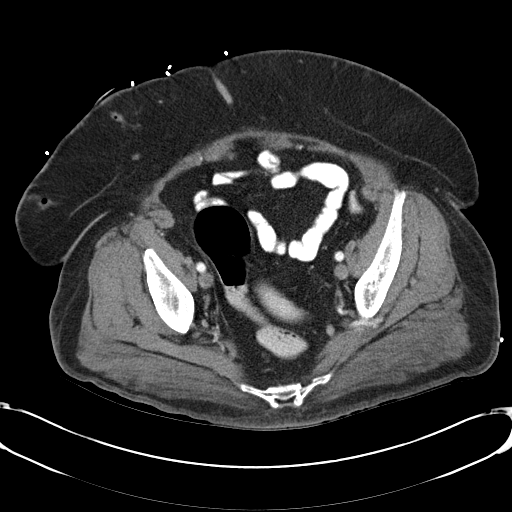
[im 27/92  soft-tissue]
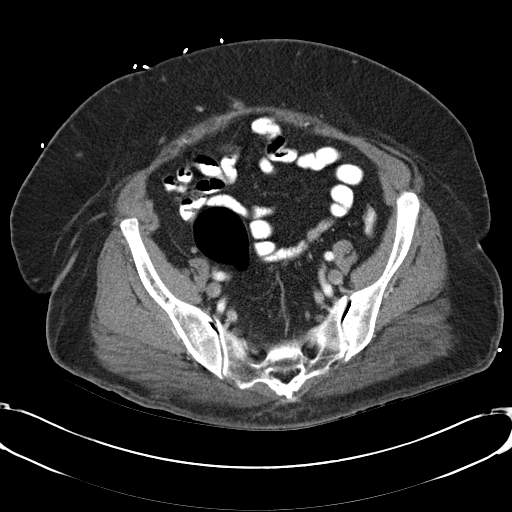
[im 33/92  soft-tissue]
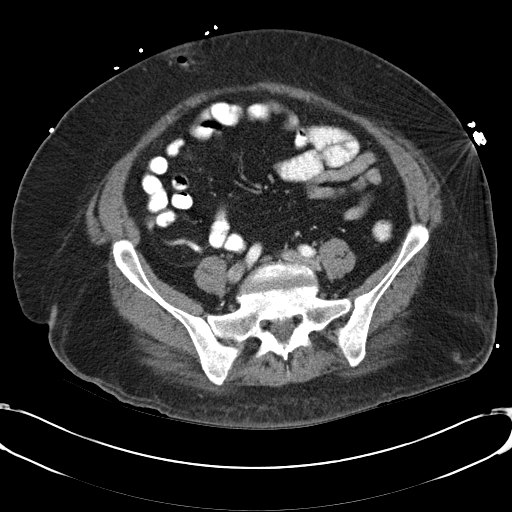
[im 38/92  soft-tissue]
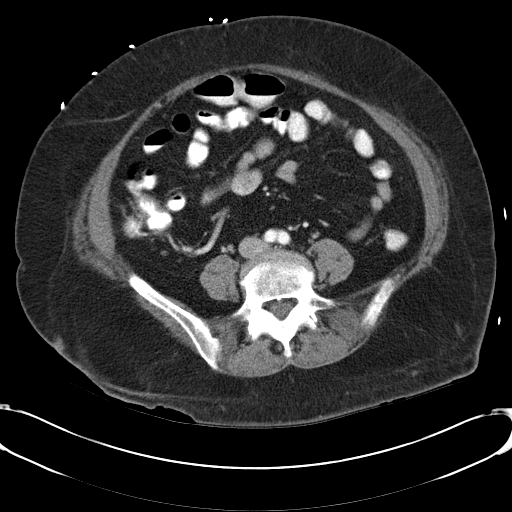
[im 49/92  soft-tissue]
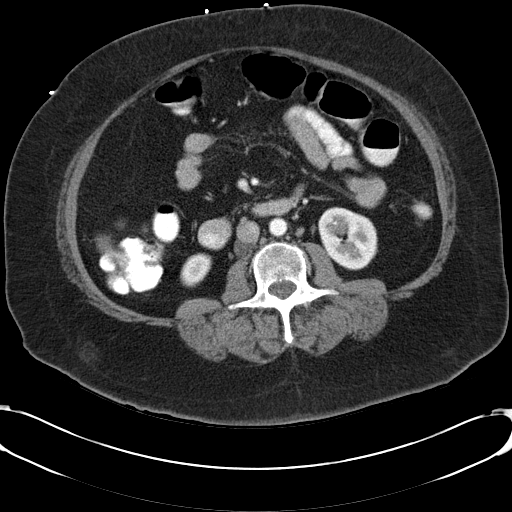
[im 54/92  soft-tissue]
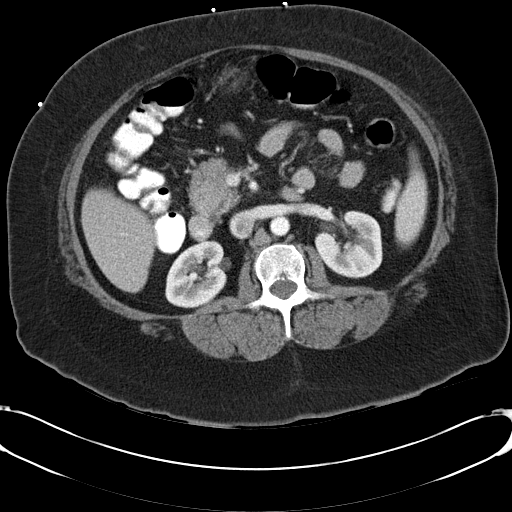
[im 59/92  soft-tissue]
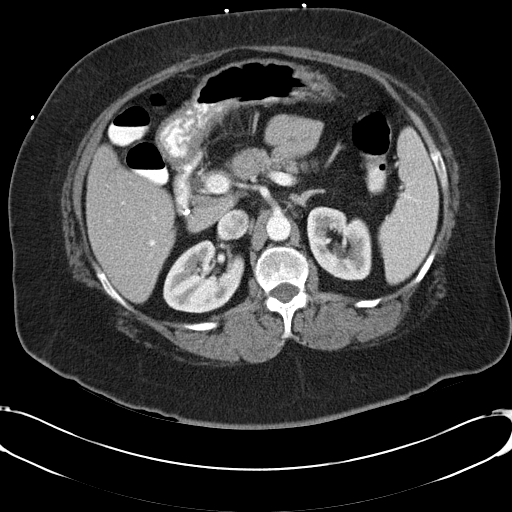
[im 59/92  bone]
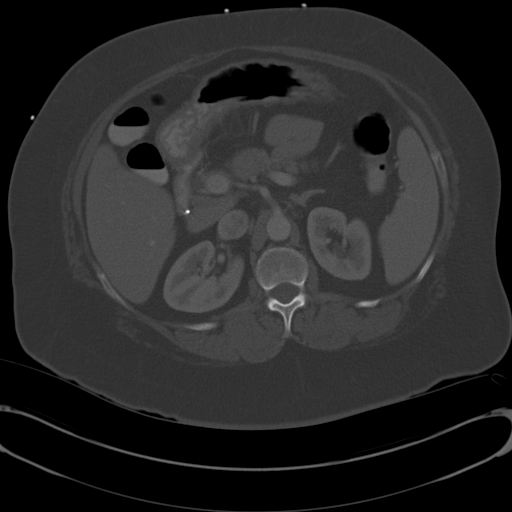
[im 65/92  soft-tissue]
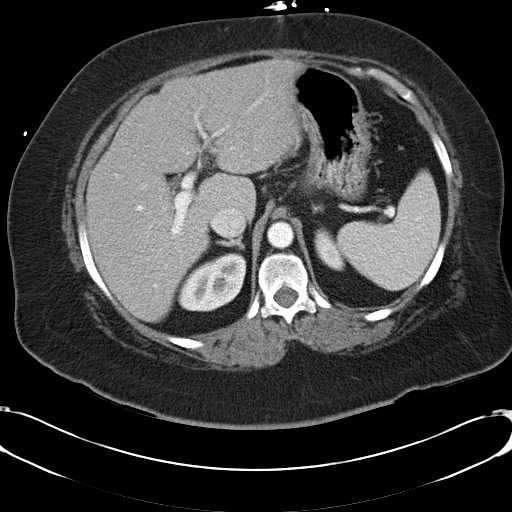
[im 70/92  soft-tissue]
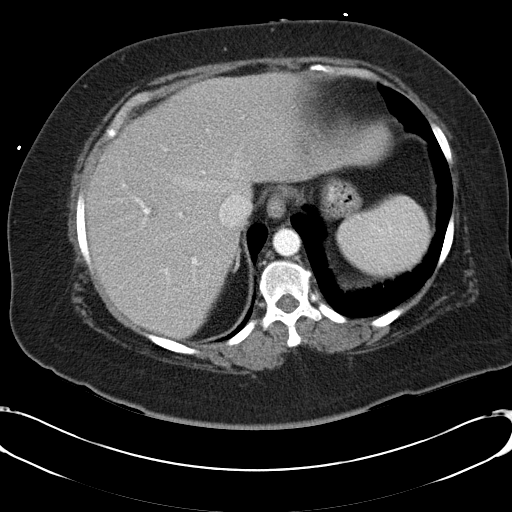
[im 70/92  lung]
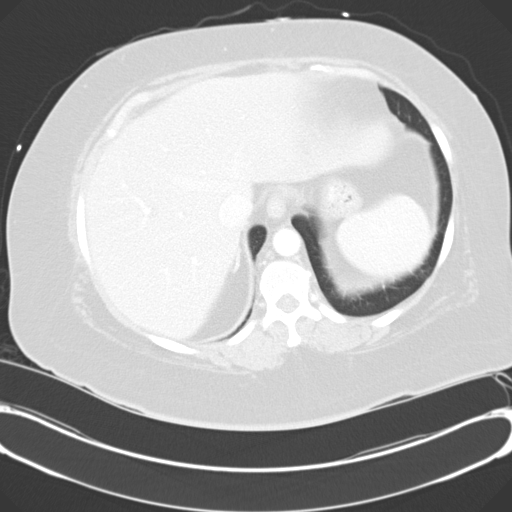
[im 75/92  lung]
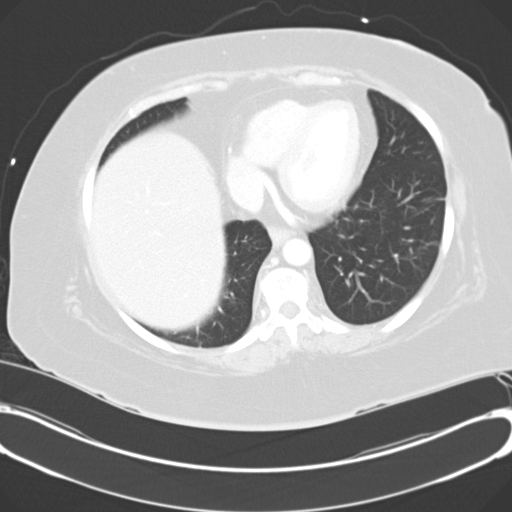
[im 81/92  soft-tissue]
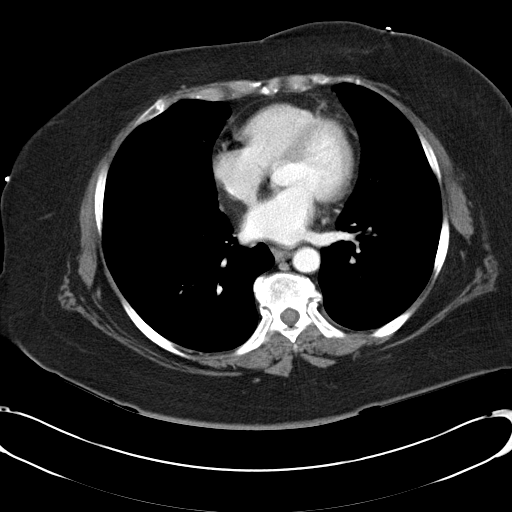
[im 81/92  lung]
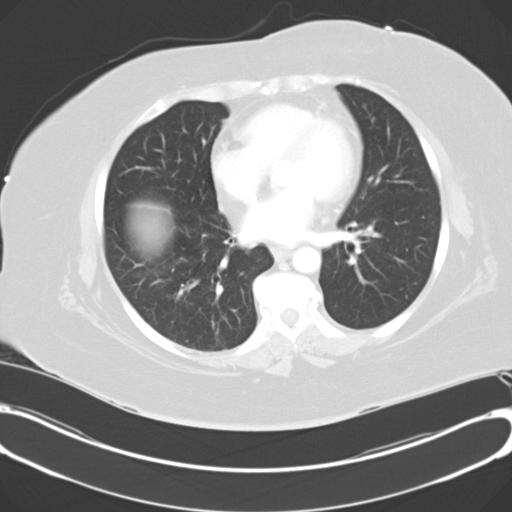
[im 86/92  soft-tissue]
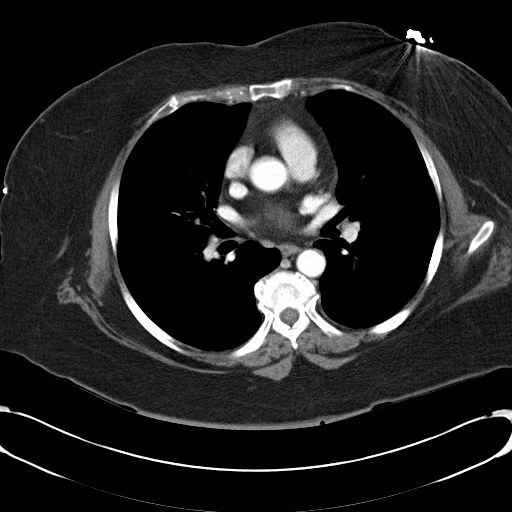
[im 86/92  lung]
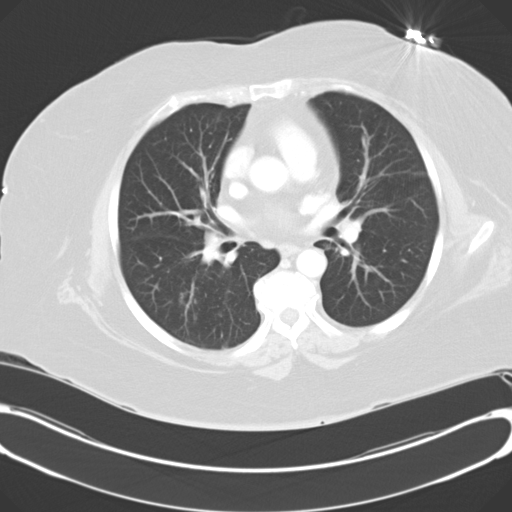

[14 of 32 positions shown; findings below may reference images not displayed]

FINDINGS: Normal liver , spleen, pancreas, kidneys, and adrenal
glands.  No biliary ductal dilatation.  Cholecystectomy surgical
clips.  No acute mesenteric abnormality.  Slightly prominent sized
retrocaval lymph node measuring approximately 11 mm in diameter.  A
10 mm node was noted at this site on the prior exam.  No other
enlarged nodes.  No unusual bowel wall thickening. Incidental
multilevel degenerative spondylotic changes of the lumbar spine
including spinal stenosis and also a disc herniation at L4-5,
including a right lateral/foraminal HNP.
IMPRESSION: No acute abdominal findings.  Slightly prominent sized retrocaval
lymph node., of undetermined etiology.  Similar finding was noted
previously.

CT PELVIS
FINDINGS: Hysterectomy.  No pelvic mass.  No pathologically
enlarged lymph nodes.  No ascites.  Bladder unremarkable.
IMPRESSION: Unremarkable CT pelvis.

## 2009-03-22 ENCOUNTER — Telehealth: Payer: Self-pay | Admitting: Family Medicine

## 2009-03-23 ENCOUNTER — Telehealth: Payer: Self-pay | Admitting: Family Medicine

## 2009-03-24 ENCOUNTER — Telehealth: Payer: Self-pay | Admitting: Family Medicine

## 2009-03-24 ENCOUNTER — Encounter: Payer: Self-pay | Admitting: Family Medicine

## 2009-03-25 ENCOUNTER — Encounter: Payer: Self-pay | Admitting: Internal Medicine

## 2009-03-25 ENCOUNTER — Telehealth: Payer: Self-pay | Admitting: Family Medicine

## 2009-03-31 ENCOUNTER — Ambulatory Visit (HOSPITAL_COMMUNITY)
Admission: RE | Admit: 2009-03-31 | Discharge: 2009-03-31 | Payer: Self-pay | Source: Home / Self Care | Admitting: Family Medicine

## 2009-03-31 ENCOUNTER — Telehealth: Payer: Self-pay | Admitting: Family Medicine

## 2009-03-31 ENCOUNTER — Telehealth (INDEPENDENT_AMBULATORY_CARE_PROVIDER_SITE_OTHER): Payer: Self-pay

## 2009-04-01 ENCOUNTER — Telehealth: Payer: Self-pay | Admitting: Family Medicine

## 2009-04-06 ENCOUNTER — Encounter: Payer: Self-pay | Admitting: Family Medicine

## 2009-04-06 ENCOUNTER — Ambulatory Visit (HOSPITAL_COMMUNITY): Admission: RE | Admit: 2009-04-06 | Discharge: 2009-04-06 | Payer: Self-pay | Admitting: Internal Medicine

## 2009-04-07 ENCOUNTER — Encounter: Payer: Self-pay | Admitting: Family Medicine

## 2009-04-08 ENCOUNTER — Encounter: Payer: Self-pay | Admitting: Family Medicine

## 2009-04-12 ENCOUNTER — Encounter (INDEPENDENT_AMBULATORY_CARE_PROVIDER_SITE_OTHER): Payer: Self-pay

## 2009-04-12 ENCOUNTER — Encounter: Payer: Self-pay | Admitting: Gastroenterology

## 2009-04-12 LAB — CONVERTED CEMR LAB
Eosinophils Absolute: 0.2 10*3/uL (ref 0.0–0.7)
Eosinophils Relative: 3 % (ref 0–5)
HCT: 29 % — ABNORMAL LOW (ref 36.0–46.0)
Lymphocytes Relative: 38 % (ref 12–46)
Lymphs Abs: 2.3 10*3/uL (ref 0.7–4.0)
MCV: 74.7 fL — ABNORMAL LOW (ref 78.0–100.0)
Monocytes Relative: 7 % (ref 3–12)
Neutrophils Relative %: 51 % (ref 43–77)
RBC: 3.88 M/uL (ref 3.87–5.11)
WBC: 6.2 10*3/uL (ref 4.0–10.5)

## 2009-04-13 ENCOUNTER — Telehealth: Payer: Self-pay | Admitting: Family Medicine

## 2009-04-13 IMAGING — CR DG CHEST 1V PORT
1 series · 1 of 1 positions shown · non-contrast
Comparison: 08/23/2007

CLINICAL DATA: Altered level of consciousness

PORTABLE CHEST - 1 VIEW

[view not recorded]
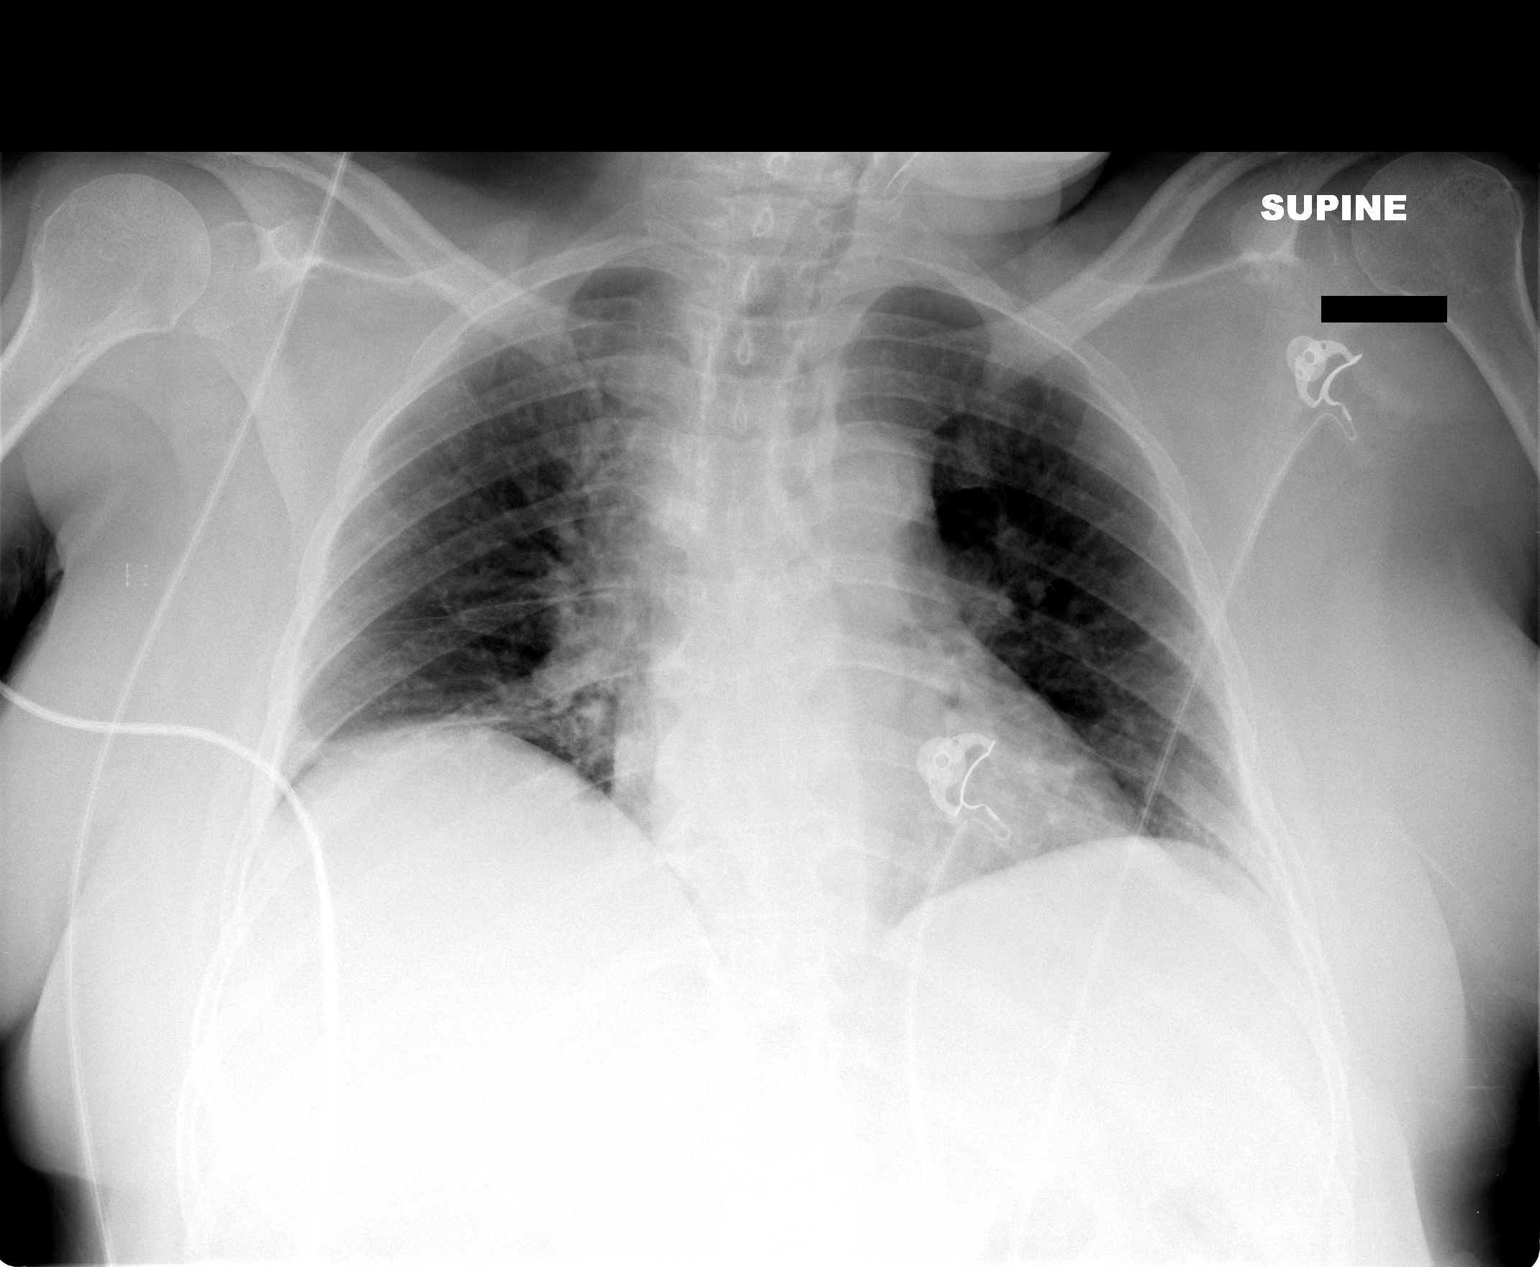

[1 of 1 positions shown; findings below may reference images not displayed]

FINDINGS: There are low lung volumes with some increase in
subsegmental atelectasis versus early infiltrates in the lung
bases.  Cannot exclude mild central pulmonary vascular congestion.
Heart size upper limits normal.  No definite effusion.  Vascular
clips in the right upper abdomen.
IMPRESSION: 1.  Low lung volumes with bibasilar atelectasis or early
infiltrates.

## 2009-04-14 ENCOUNTER — Ambulatory Visit: Payer: Self-pay | Admitting: Internal Medicine

## 2009-04-14 IMAGING — CR DG CHEST 2V
2 series · 2 of 2 positions shown · non-contrast
Comparison: 09/19/2007

CLINICAL DATA: Hypotension, pneumonia, former smoker

CHEST - 2 VIEW

[view not recorded (1 of 2)]
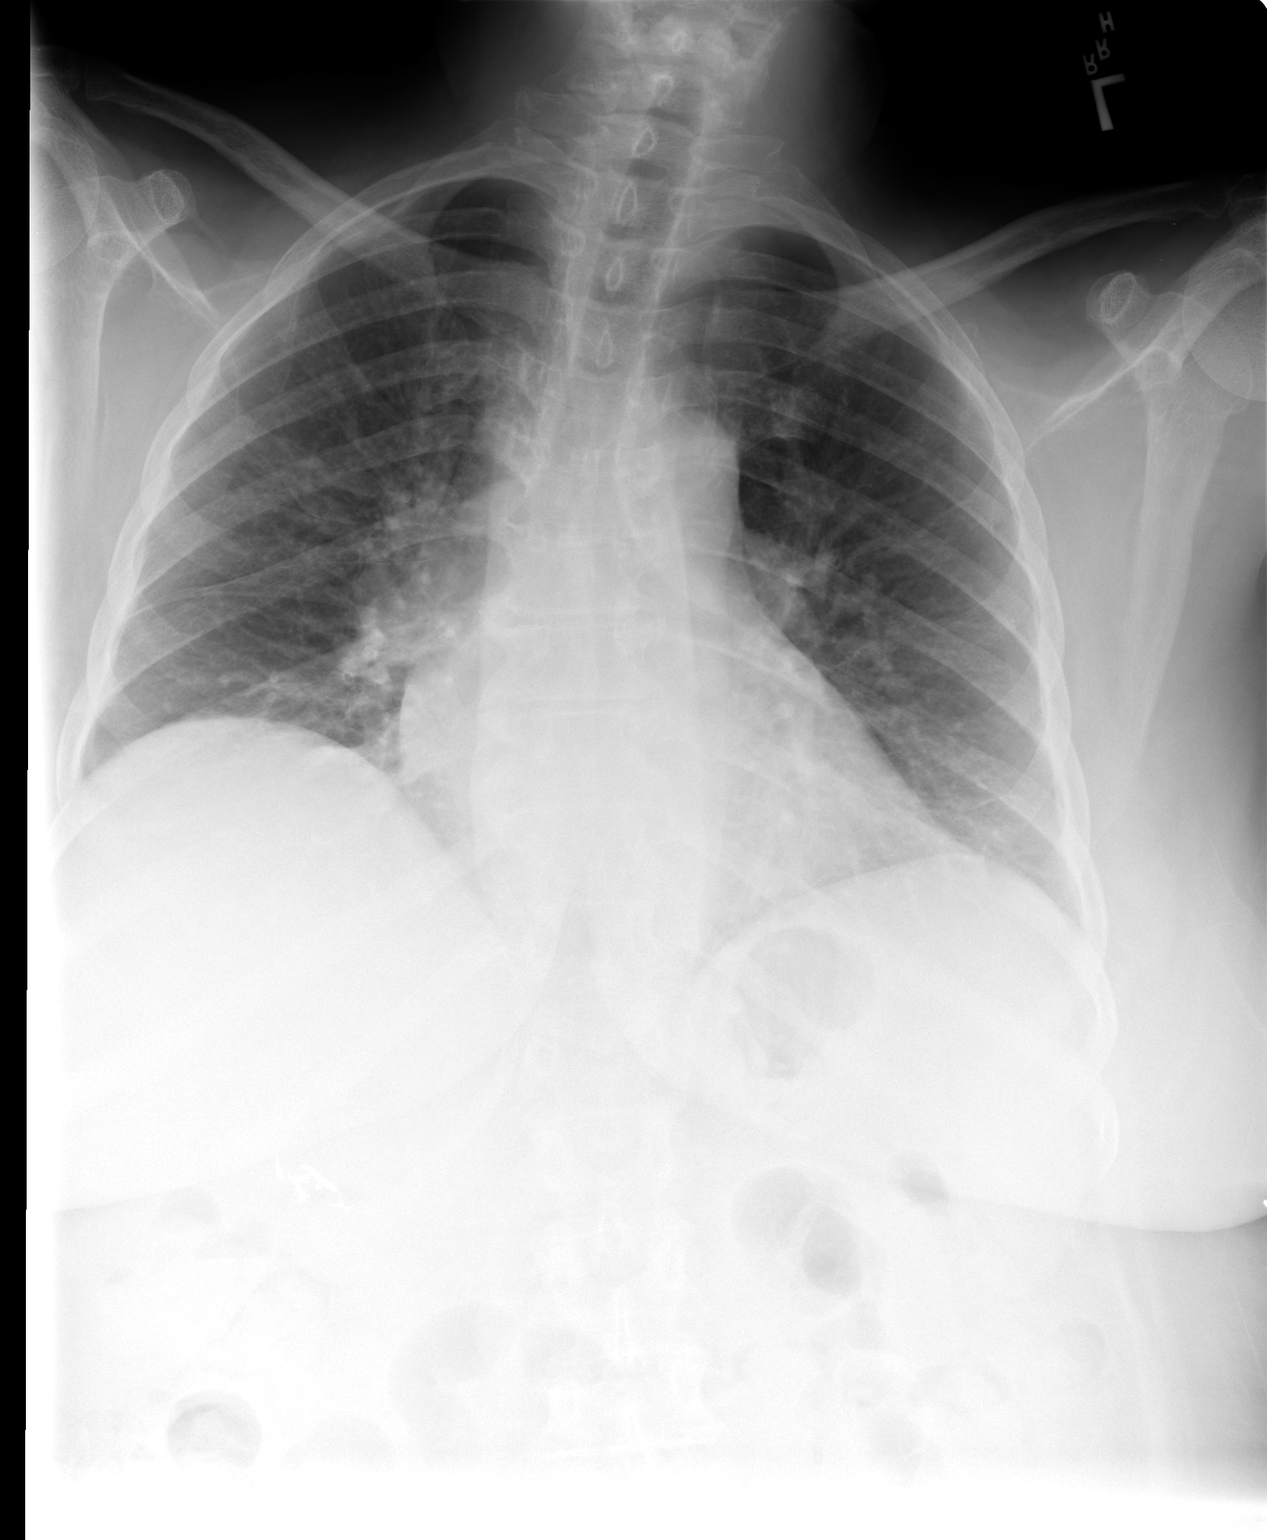

[view not recorded (2 of 2)]
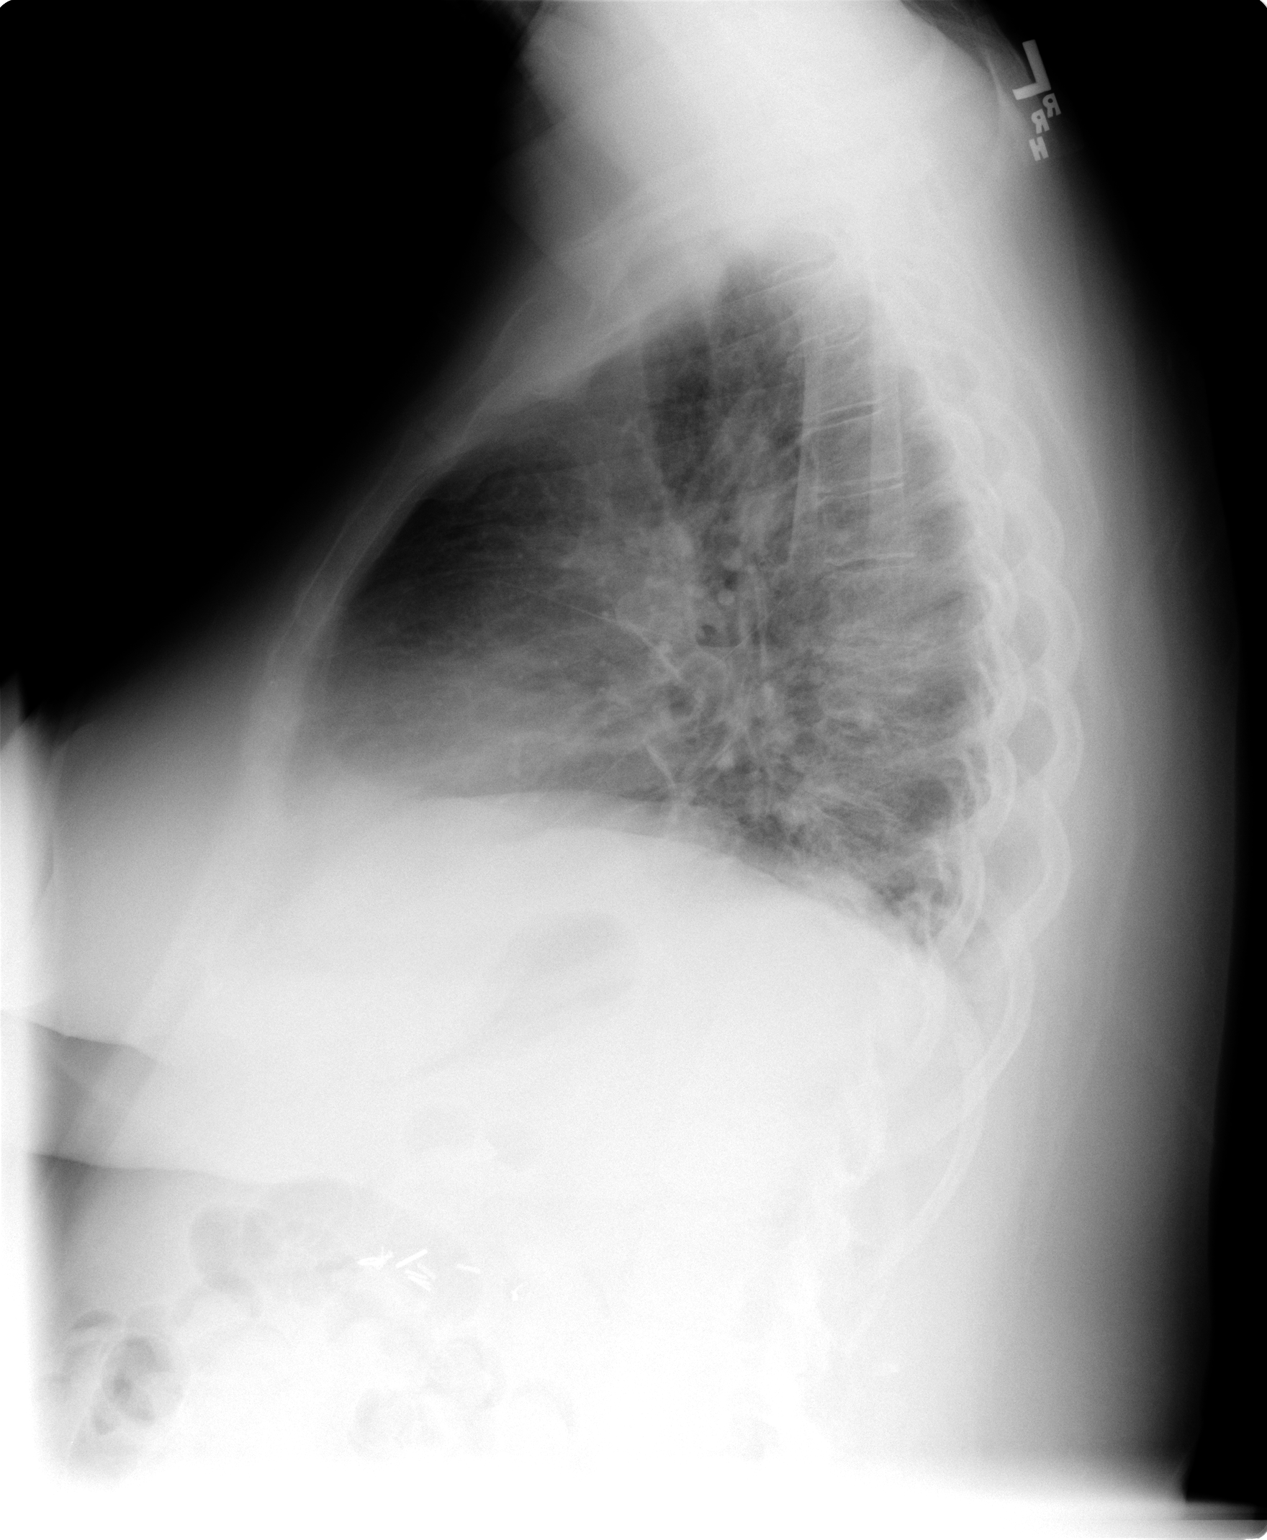

[2 of 2 positions shown; findings below may reference images not displayed]

FINDINGS: Mild cardiac enlargement and slight pulmonary vascular congestion.
Normal mediastinal contours.
Minimal bibasilar atelectasis and perihilar bronchitic changes.
No definite infiltrate or effusion.
Surgical clips right upper quadrant question cholecystectomy.
Slightly decreased lung volumes.
IMPRESSION: Mild cardiomegaly.
Bronchitic changes with bibasilar atelectasis.

## 2009-04-15 ENCOUNTER — Telehealth: Payer: Self-pay | Admitting: Family Medicine

## 2009-04-15 ENCOUNTER — Encounter: Payer: Self-pay | Admitting: Family Medicine

## 2009-04-15 LAB — CONVERTED CEMR LAB: Calcium: 9.3 mg/dL (ref 8.4–10.5)

## 2009-04-16 ENCOUNTER — Encounter: Payer: Self-pay | Admitting: Internal Medicine

## 2009-04-16 LAB — CONVERTED CEMR LAB
HCT: 35.5 % — ABNORMAL LOW (ref 36.0–46.0)
Hemoglobin: 10.2 g/dL — ABNORMAL LOW (ref 12.0–15.0)

## 2009-04-19 ENCOUNTER — Telehealth: Payer: Self-pay | Admitting: Gastroenterology

## 2009-04-19 ENCOUNTER — Telehealth: Payer: Self-pay | Admitting: Family Medicine

## 2009-04-19 ENCOUNTER — Emergency Department (HOSPITAL_COMMUNITY): Admission: EM | Admit: 2009-04-19 | Discharge: 2009-04-19 | Payer: Self-pay | Admitting: Emergency Medicine

## 2009-04-23 ENCOUNTER — Telehealth (INDEPENDENT_AMBULATORY_CARE_PROVIDER_SITE_OTHER): Payer: Self-pay | Admitting: *Deleted

## 2009-04-23 ENCOUNTER — Encounter: Payer: Self-pay | Admitting: Internal Medicine

## 2009-04-23 ENCOUNTER — Ambulatory Visit: Payer: Self-pay | Admitting: Family Medicine

## 2009-04-23 DIAGNOSIS — M25579 Pain in unspecified ankle and joints of unspecified foot: Secondary | ICD-10-CM

## 2009-04-23 LAB — CONVERTED CEMR LAB: Microalb Creat Ratio: 7 mg/g (ref 0.0–30.0)

## 2009-04-25 IMAGING — US US SOFT TISSUE HEAD/NECK
1 series · 14 of 25 positions shown · non-contrast
Comparison: None

CLINICAL DATA: Goiter

THYROID ULTRASOUND
TECHNIQUE: Ultrasound examination of the thyroid gland and
adjacent soft tissues was performed.

[Series 1: unknown · 0.09mm/px · 14 of 46 slices shown]
[im 1/46]
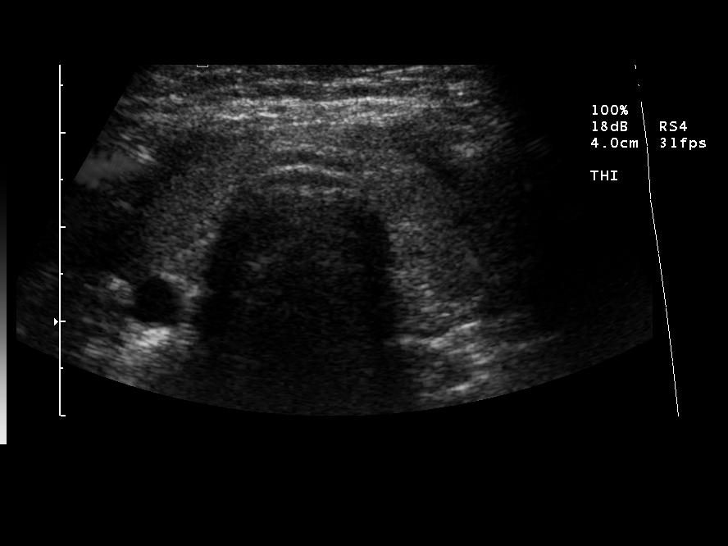
[im 4/46]
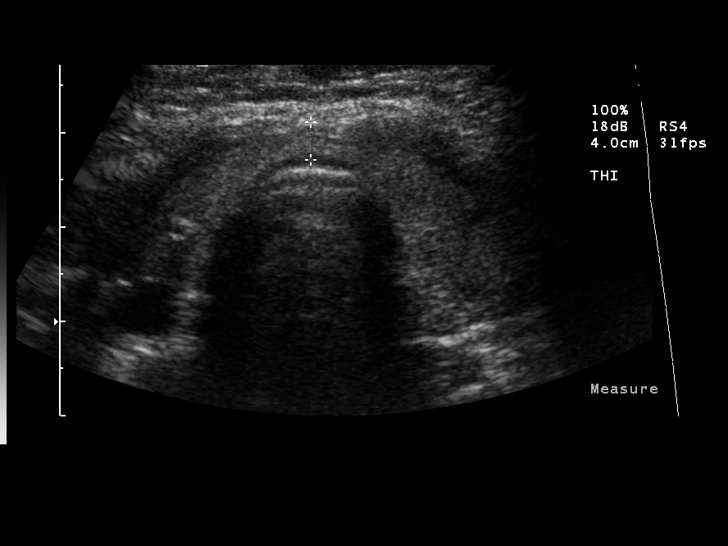
[im 8/46]
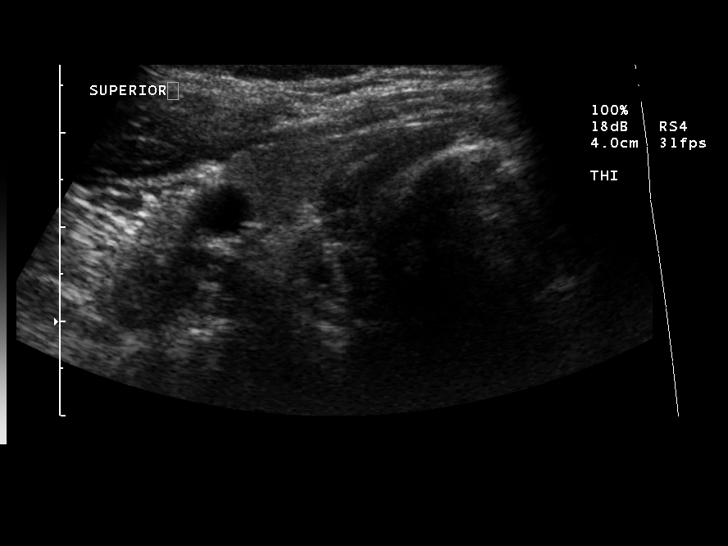
[im 12/46]
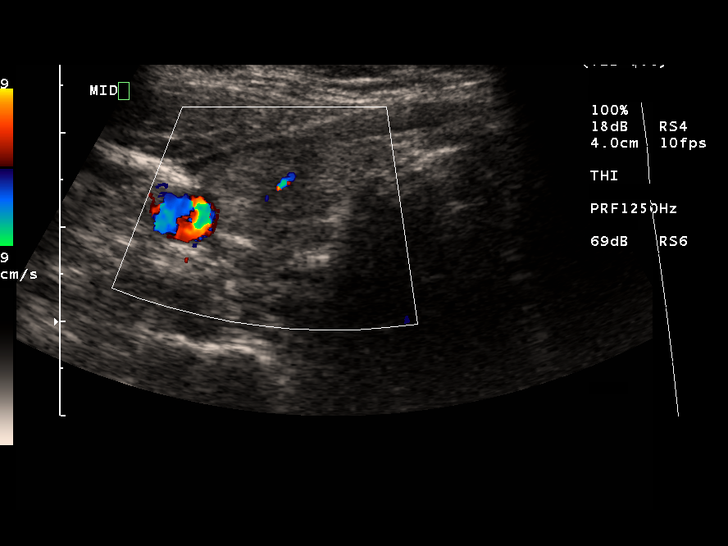
[im 16/46]
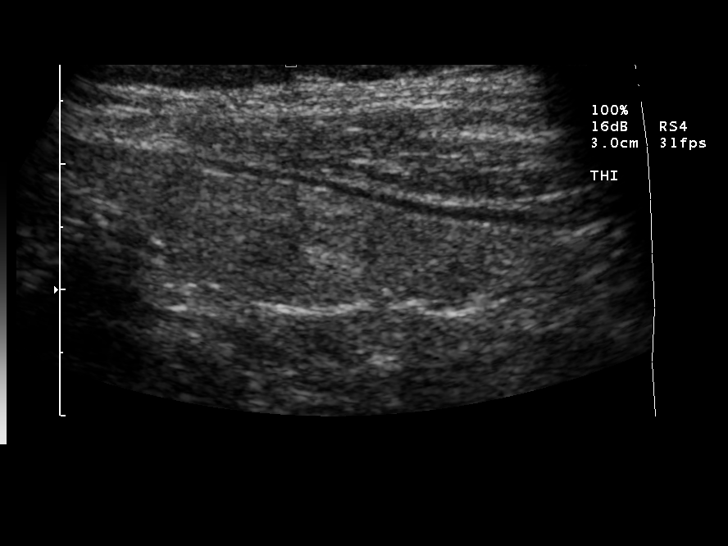
[im 17/46]
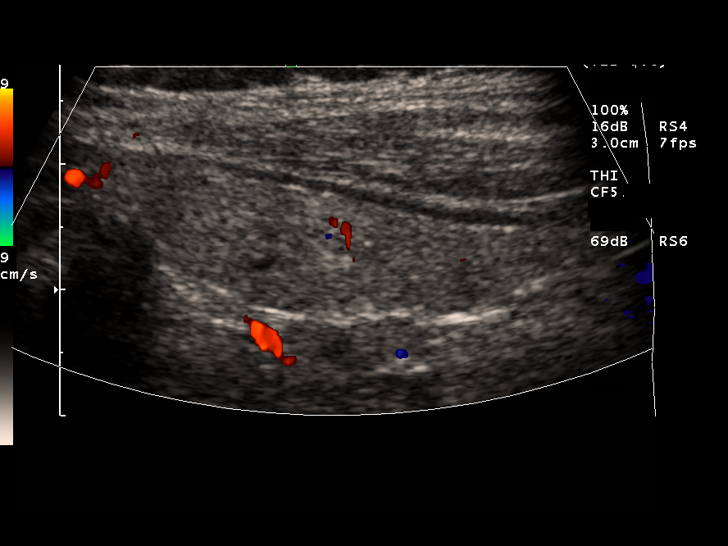
[im 21/46]
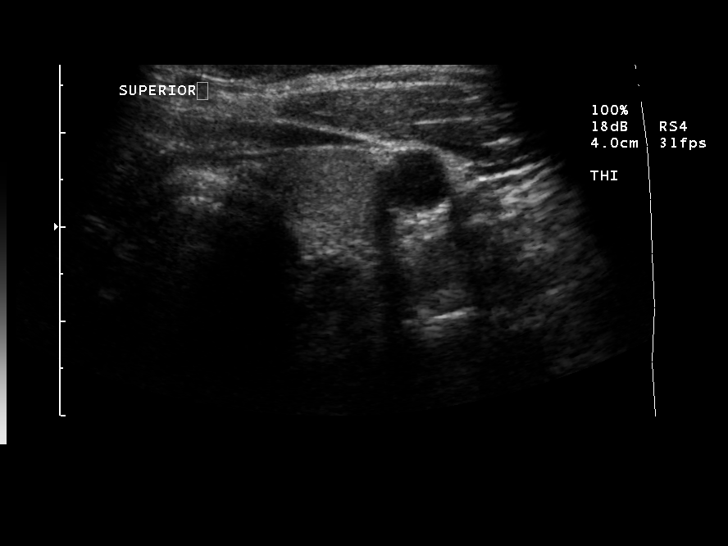
[im 25/46]
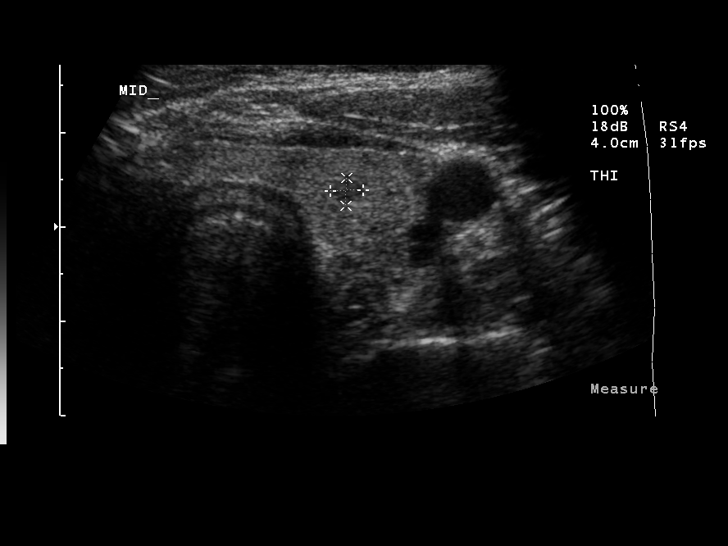
[im 29/46]
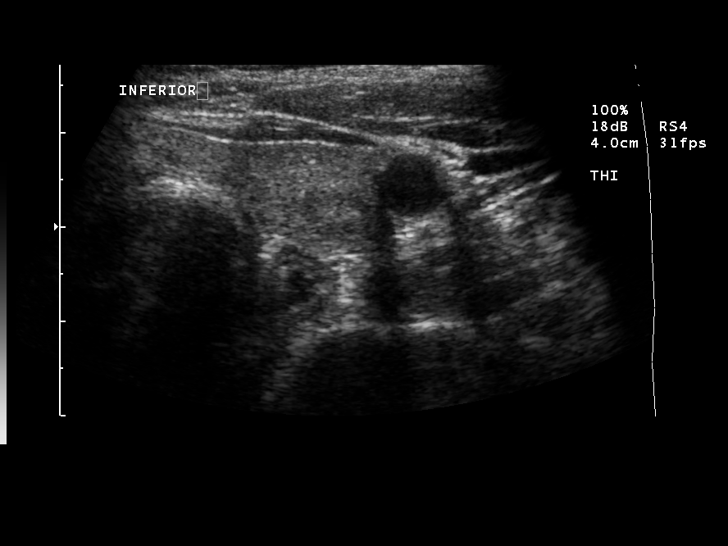
[im 31/46]
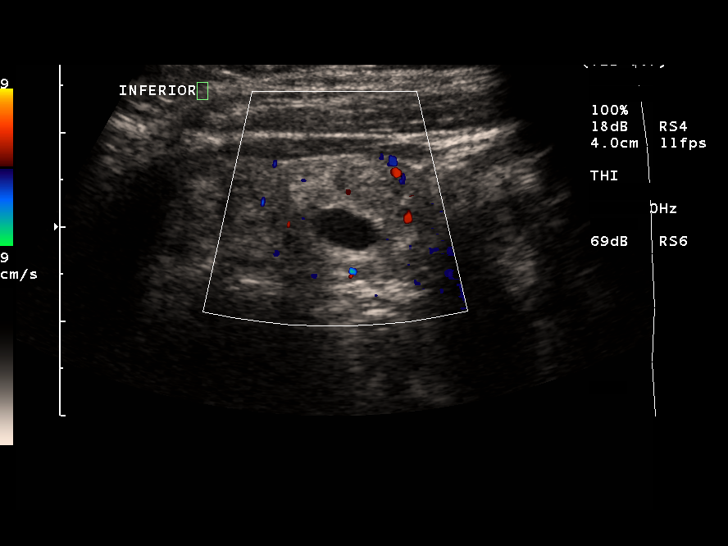
[im 34/46]
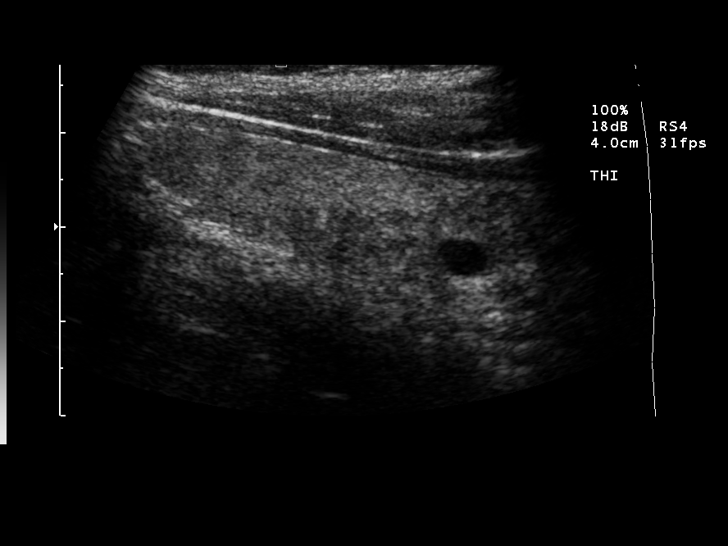
[im 38/46]
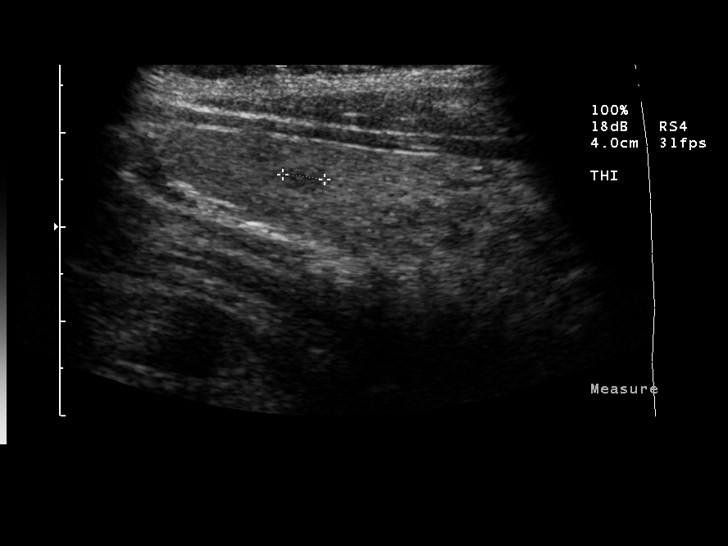
[im 42/46]
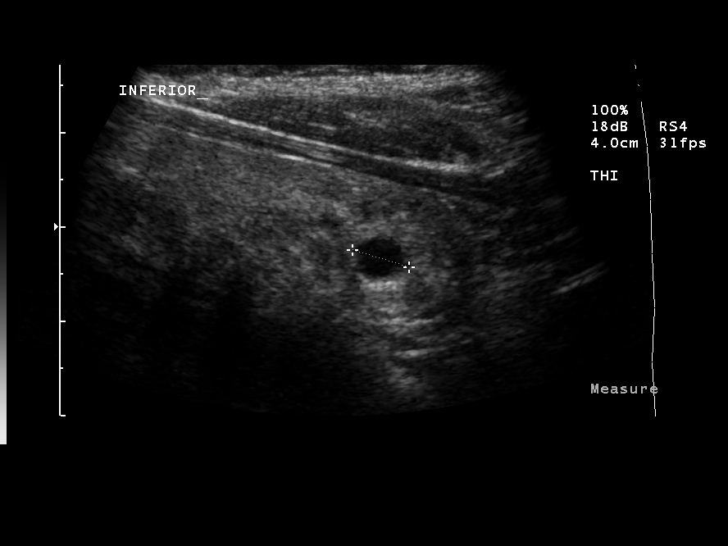
[im 46/46]
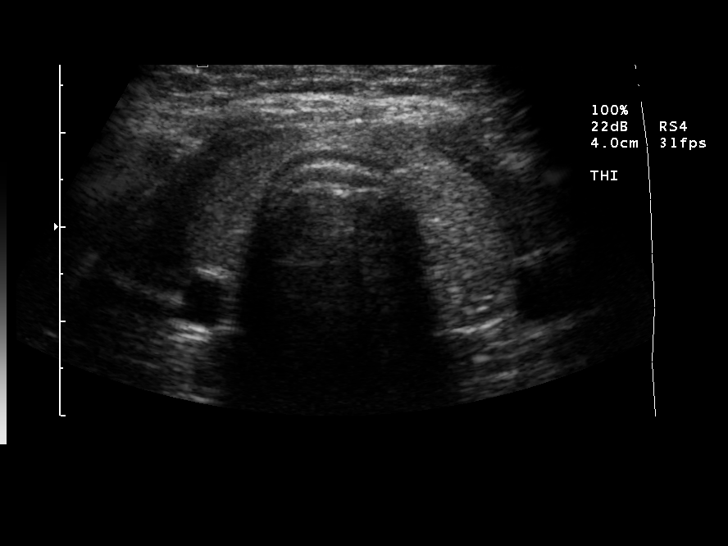

[14 of 25 positions shown; findings below may reference images not displayed]

FINDINGS: Right thyroid lobe 3.7 cm length by 1.0 cm AP by 1.7 cm transverse.
Left thyroid lobe 4.6 cm length by 1.3 cm AP by 1.5 cm transverse.
Thyroid isthmus 4 mm thick.
Several vague tiny nodular foci are seen within left thyroid lobe,
largest 11 x 10 x 7 mm diameter questionable solid nodule mid lobe
posteriorly.
No dominant thyroid mass.
No thyroid calcification is seen.
7 mm cyst inferior pole left lobe.
No regional adenopathy.
IMPRESSION: Small solid and cystic nodules in left thyroid lobe, nonspecific,
largest 11 mm diameter.
No dominant thyroid mass identified.
Follow-up ultrasound recommended in 6 months to establish stability
of above findings.

## 2009-04-26 ENCOUNTER — Telehealth: Payer: Self-pay | Admitting: Family Medicine

## 2009-04-28 ENCOUNTER — Ambulatory Visit (HOSPITAL_COMMUNITY): Admission: RE | Admit: 2009-04-28 | Discharge: 2009-04-28 | Payer: Self-pay | Admitting: Internal Medicine

## 2009-04-28 ENCOUNTER — Telehealth: Payer: Self-pay | Admitting: Family Medicine

## 2009-04-28 ENCOUNTER — Ambulatory Visit: Payer: Self-pay | Admitting: Internal Medicine

## 2009-04-29 ENCOUNTER — Telehealth: Payer: Self-pay | Admitting: Family Medicine

## 2009-04-29 ENCOUNTER — Encounter (HOSPITAL_COMMUNITY): Admission: RE | Admit: 2009-04-29 | Discharge: 2009-05-29 | Payer: Self-pay | Admitting: Internal Medicine

## 2009-05-03 ENCOUNTER — Telehealth (INDEPENDENT_AMBULATORY_CARE_PROVIDER_SITE_OTHER): Payer: Self-pay | Admitting: *Deleted

## 2009-05-03 ENCOUNTER — Telehealth: Payer: Self-pay | Admitting: Family Medicine

## 2009-05-04 ENCOUNTER — Encounter: Payer: Self-pay | Admitting: Family Medicine

## 2009-05-06 ENCOUNTER — Telehealth: Payer: Self-pay | Admitting: Family Medicine

## 2009-05-07 ENCOUNTER — Telehealth: Payer: Self-pay | Admitting: Family Medicine

## 2009-05-10 ENCOUNTER — Encounter: Payer: Self-pay | Admitting: Family Medicine

## 2009-05-12 ENCOUNTER — Ambulatory Visit: Payer: Self-pay | Admitting: Family Medicine

## 2009-05-13 ENCOUNTER — Telehealth (INDEPENDENT_AMBULATORY_CARE_PROVIDER_SITE_OTHER): Payer: Self-pay | Admitting: *Deleted

## 2009-05-18 ENCOUNTER — Telehealth: Payer: Self-pay | Admitting: Physician Assistant

## 2009-05-18 ENCOUNTER — Ambulatory Visit: Payer: Self-pay | Admitting: Physician Assistant

## 2009-05-19 IMAGING — RF DG ESOPHAGUS
5 of 8 series · 15 of 24 positions shown · non-contrast
Comparison: None

CLINICAL DATA: Dysphasia for 3 months.

BARIUM SWALLOW / ESOPHAGRAM

[Series 1: run · 11 of 30 slices shown (1 of 5)]
[im 1/30]
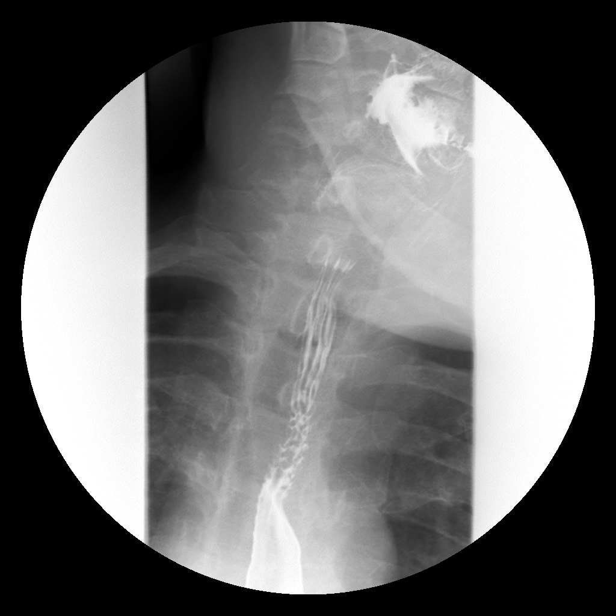
[im 4/30]
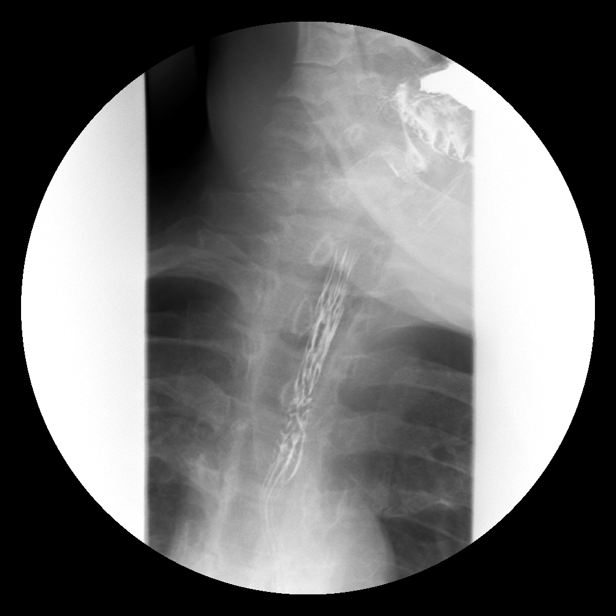
[im 8/30]
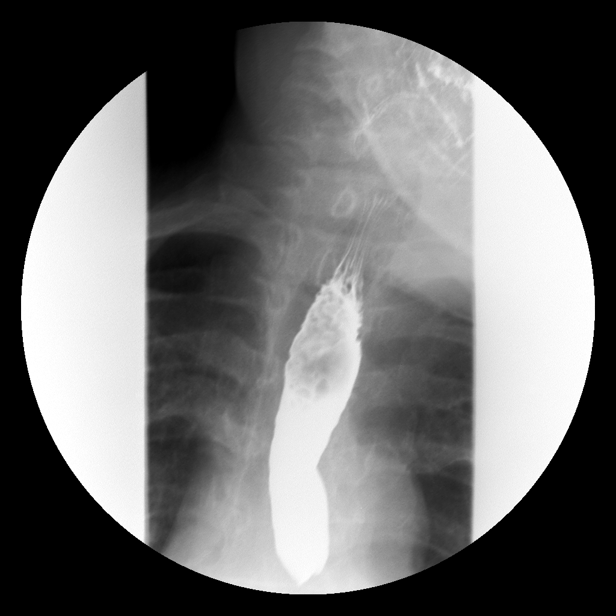
[im 10/30]
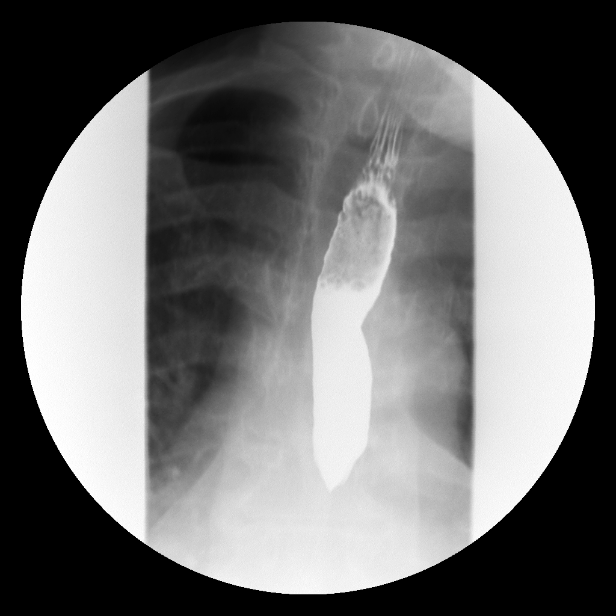
[im 13/30]
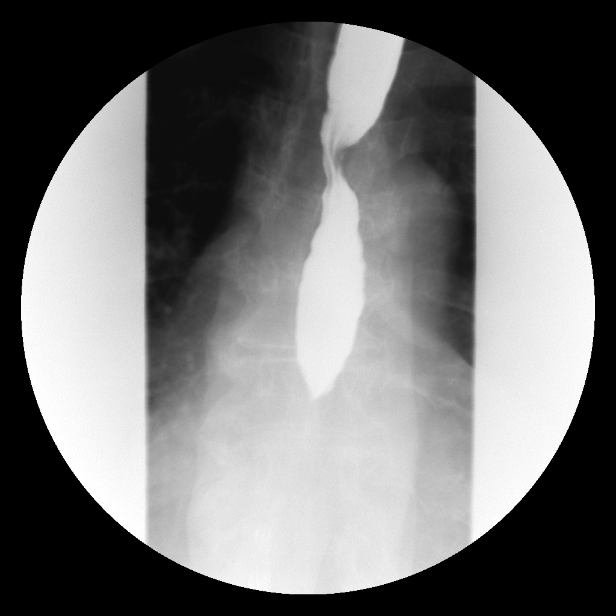
[im 15/30]
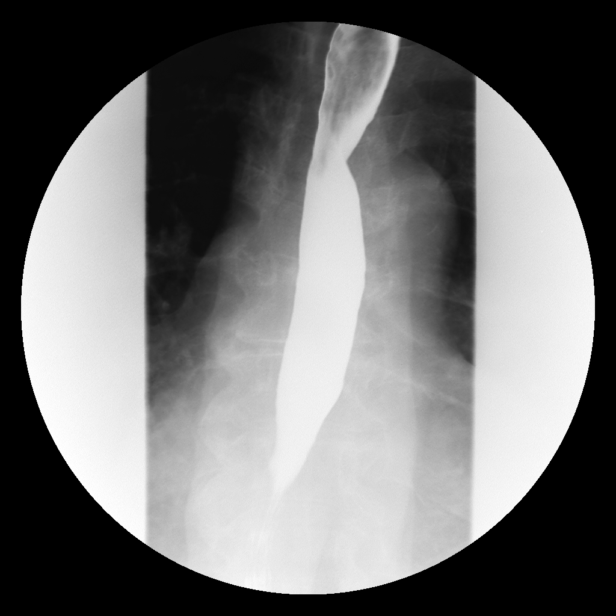
[im 19/30]
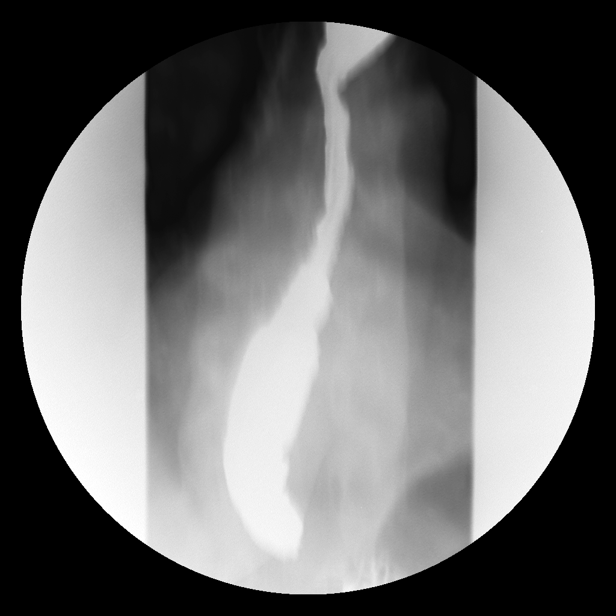
[im 22/30]
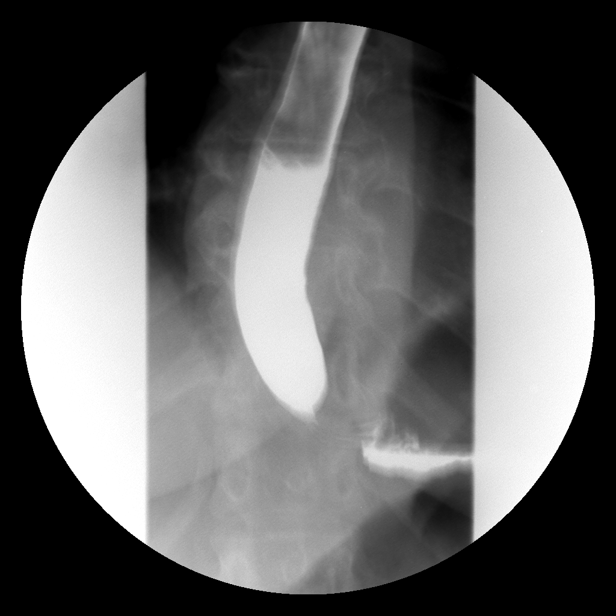
[im 24/30]
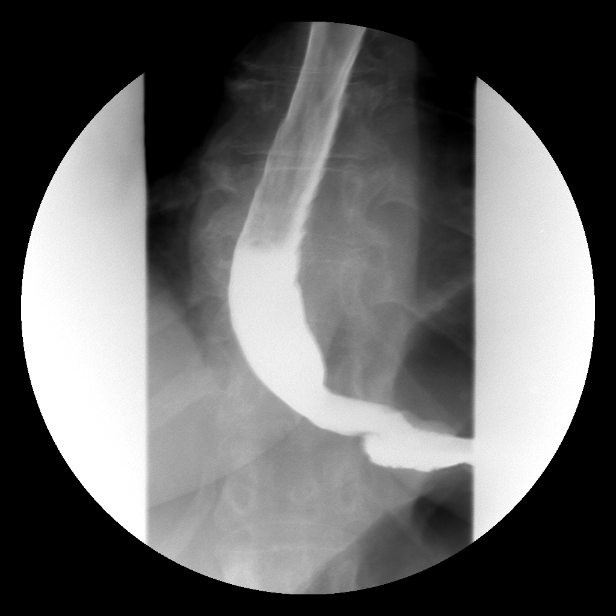
[im 28/30]
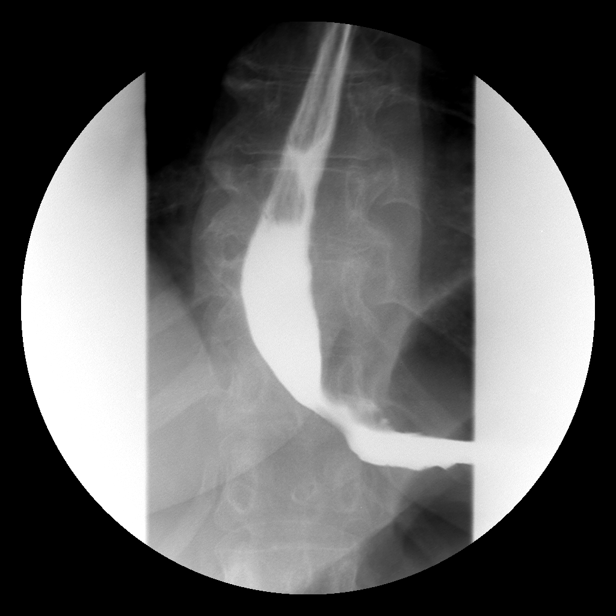
[im 30/30]
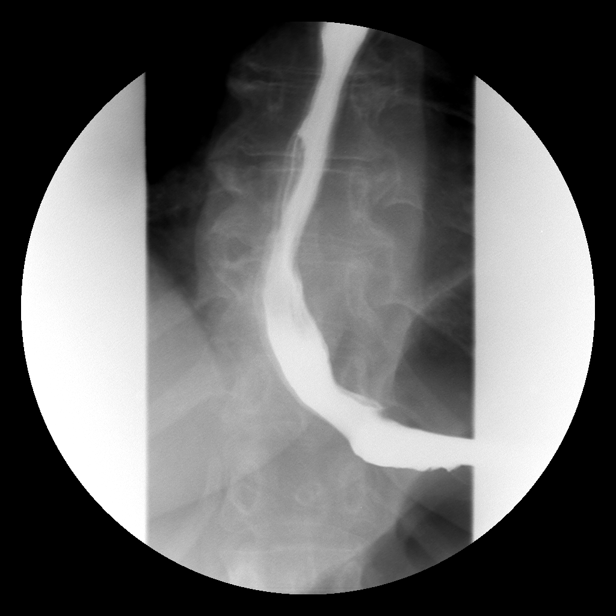

[Series 3: run · 1 of 1 slices shown (2 of 5)]
[im 1/1]
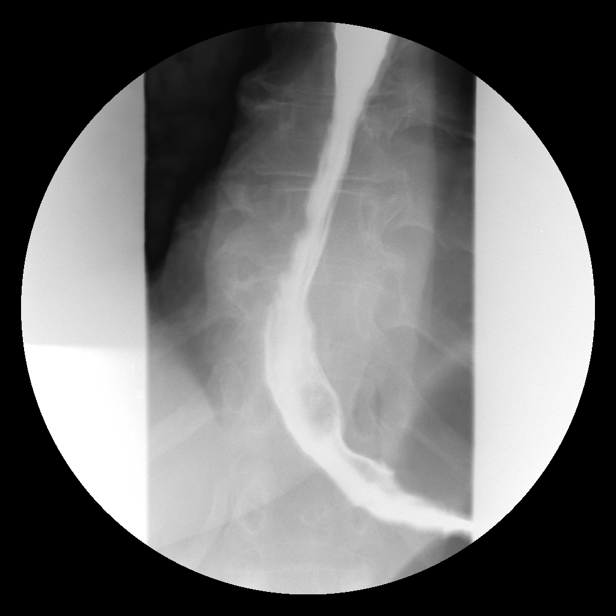

[Series 5: run · 1 of 1 slices shown (3 of 5)]
[im 1/1]
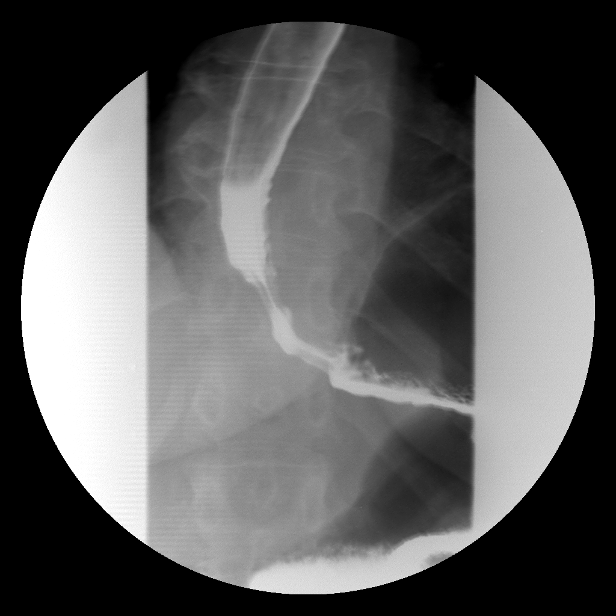

[Series 6: run · 1 of 2 slices shown (4 of 5)]
[im 1/2]
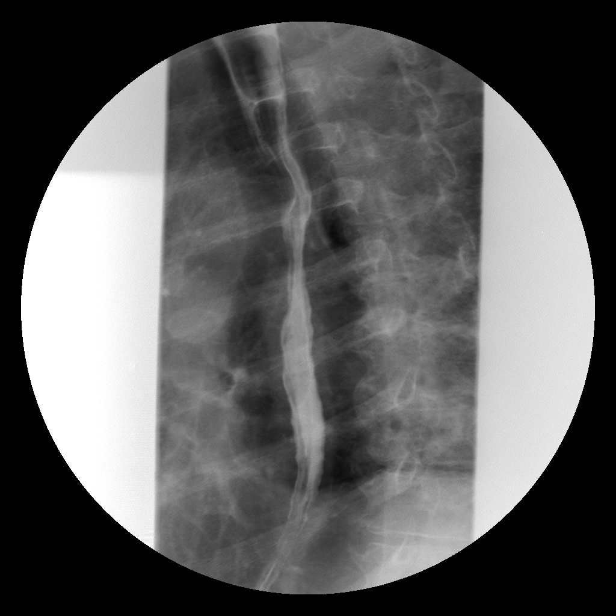

[Series 8: run · 1 of 1 slices shown (5 of 5)]
[im 1/1]
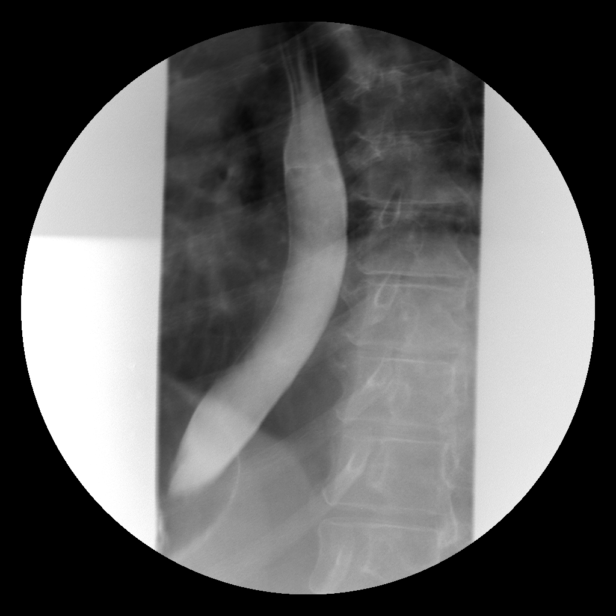

[15 of 24 positions shown; findings below may reference images not displayed]

FINDINGS: A  high density study was performed.  There is mild
decrease in esophageal motility.  There is no stricture or mass.
There is no diverticulum.  The barium tablet passed readily into
the stomach.  There is no significant hiatal hernia or reflux.
IMPRESSION: Mild esophageal dysmotility.  No acute abnormality.

## 2009-05-21 ENCOUNTER — Telehealth: Payer: Self-pay | Admitting: Family Medicine

## 2009-05-24 ENCOUNTER — Telehealth: Payer: Self-pay | Admitting: Family Medicine

## 2009-05-25 ENCOUNTER — Telehealth: Payer: Self-pay | Admitting: Physician Assistant

## 2009-05-26 ENCOUNTER — Encounter: Payer: Self-pay | Admitting: Internal Medicine

## 2009-05-27 ENCOUNTER — Telehealth (INDEPENDENT_AMBULATORY_CARE_PROVIDER_SITE_OTHER): Payer: Self-pay

## 2009-05-28 ENCOUNTER — Encounter: Payer: Self-pay | Admitting: Internal Medicine

## 2009-05-29 ENCOUNTER — Inpatient Hospital Stay (HOSPITAL_COMMUNITY): Admission: EM | Admit: 2009-05-29 | Discharge: 2009-05-30 | Payer: Self-pay | Admitting: Emergency Medicine

## 2009-05-31 ENCOUNTER — Telehealth: Payer: Self-pay | Admitting: Family Medicine

## 2009-06-01 ENCOUNTER — Telehealth (INDEPENDENT_AMBULATORY_CARE_PROVIDER_SITE_OTHER): Payer: Self-pay | Admitting: *Deleted

## 2009-06-07 ENCOUNTER — Encounter: Payer: Self-pay | Admitting: Family Medicine

## 2009-06-08 ENCOUNTER — Ambulatory Visit: Payer: Self-pay | Admitting: Family Medicine

## 2009-06-10 LAB — CONVERTED CEMR LAB
BUN: 12 mg/dL (ref 6–23)
Chloride: 102 meq/L (ref 96–112)
Creatinine, Ser: 0.73 mg/dL (ref 0.40–1.20)
Glucose, Bld: 150 mg/dL — ABNORMAL HIGH (ref 70–99)
Potassium: 4.7 meq/L (ref 3.5–5.3)
Vit D, 25-Hydroxy: 30 ng/mL (ref 30–89)

## 2009-06-14 ENCOUNTER — Telehealth: Payer: Self-pay | Admitting: Family Medicine

## 2009-06-16 ENCOUNTER — Encounter: Payer: Self-pay | Admitting: Gastroenterology

## 2009-06-16 ENCOUNTER — Telehealth: Payer: Self-pay | Admitting: Family Medicine

## 2009-06-17 ENCOUNTER — Telehealth: Payer: Self-pay | Admitting: Family Medicine

## 2009-06-18 ENCOUNTER — Encounter: Payer: Self-pay | Admitting: Family Medicine

## 2009-06-22 ENCOUNTER — Telehealth: Payer: Self-pay | Admitting: Family Medicine

## 2009-06-24 ENCOUNTER — Encounter: Payer: Self-pay | Admitting: Internal Medicine

## 2009-06-25 ENCOUNTER — Telehealth: Payer: Self-pay | Admitting: Family Medicine

## 2009-06-28 ENCOUNTER — Telehealth: Payer: Self-pay | Admitting: Family Medicine

## 2009-06-29 ENCOUNTER — Telehealth: Payer: Self-pay | Admitting: Family Medicine

## 2009-06-30 ENCOUNTER — Encounter: Payer: Self-pay | Admitting: Family Medicine

## 2009-07-01 ENCOUNTER — Telehealth: Payer: Self-pay | Admitting: Family Medicine

## 2009-07-07 ENCOUNTER — Ambulatory Visit: Payer: Self-pay | Admitting: Internal Medicine

## 2009-07-07 DIAGNOSIS — E1143 Type 2 diabetes mellitus with diabetic autonomic (poly)neuropathy: Secondary | ICD-10-CM | POA: Insufficient documentation

## 2009-07-07 DIAGNOSIS — E119 Type 2 diabetes mellitus without complications: Secondary | ICD-10-CM | POA: Insufficient documentation

## 2009-07-09 ENCOUNTER — Telehealth: Payer: Self-pay | Admitting: Family Medicine

## 2009-07-11 ENCOUNTER — Emergency Department (HOSPITAL_COMMUNITY): Admission: EM | Admit: 2009-07-11 | Discharge: 2009-07-11 | Payer: Self-pay | Admitting: Emergency Medicine

## 2009-07-12 ENCOUNTER — Telehealth: Payer: Self-pay | Admitting: Family Medicine

## 2009-07-12 ENCOUNTER — Encounter: Admission: RE | Admit: 2009-07-12 | Discharge: 2009-07-12 | Payer: Self-pay | Admitting: Family Medicine

## 2009-07-12 ENCOUNTER — Emergency Department (HOSPITAL_COMMUNITY): Admission: EM | Admit: 2009-07-12 | Discharge: 2009-07-12 | Payer: Self-pay | Admitting: Emergency Medicine

## 2009-07-13 ENCOUNTER — Ambulatory Visit: Payer: Self-pay | Admitting: Family Medicine

## 2009-07-14 ENCOUNTER — Telehealth: Payer: Self-pay | Admitting: Family Medicine

## 2009-07-15 ENCOUNTER — Encounter: Payer: Self-pay | Admitting: Family Medicine

## 2009-07-16 ENCOUNTER — Telehealth: Payer: Self-pay | Admitting: Family Medicine

## 2009-07-16 ENCOUNTER — Encounter: Payer: Self-pay | Admitting: Family Medicine

## 2009-07-20 ENCOUNTER — Encounter: Payer: Self-pay | Admitting: Gastroenterology

## 2009-07-21 ENCOUNTER — Telehealth: Payer: Self-pay | Admitting: Physician Assistant

## 2009-07-22 ENCOUNTER — Encounter: Payer: Self-pay | Admitting: Gastroenterology

## 2009-07-22 ENCOUNTER — Telehealth: Payer: Self-pay | Admitting: Physician Assistant

## 2009-07-27 ENCOUNTER — Encounter: Payer: Self-pay | Admitting: Family Medicine

## 2009-07-27 ENCOUNTER — Telehealth: Payer: Self-pay | Admitting: Family Medicine

## 2009-08-16 ENCOUNTER — Emergency Department (HOSPITAL_COMMUNITY): Admission: EM | Admit: 2009-08-16 | Discharge: 2009-08-16 | Payer: Self-pay | Admitting: Emergency Medicine

## 2009-08-19 ENCOUNTER — Ambulatory Visit: Payer: Self-pay | Admitting: Internal Medicine

## 2009-08-20 ENCOUNTER — Telehealth: Payer: Self-pay | Admitting: Family Medicine

## 2009-08-23 ENCOUNTER — Telehealth: Payer: Self-pay | Admitting: Urgent Care

## 2009-08-24 ENCOUNTER — Telehealth: Payer: Self-pay | Admitting: Family Medicine

## 2009-08-27 ENCOUNTER — Ambulatory Visit: Payer: Self-pay | Admitting: Family Medicine

## 2009-08-27 IMAGING — CR DG CHEST 1V PORT
1 series · 1 of 1 positions shown · non-contrast
Comparison: 09/20/2007.

CLINICAL DATA: Chest pain and congestion.

PORTABLE CHEST - 1 VIEW

[view not recorded]
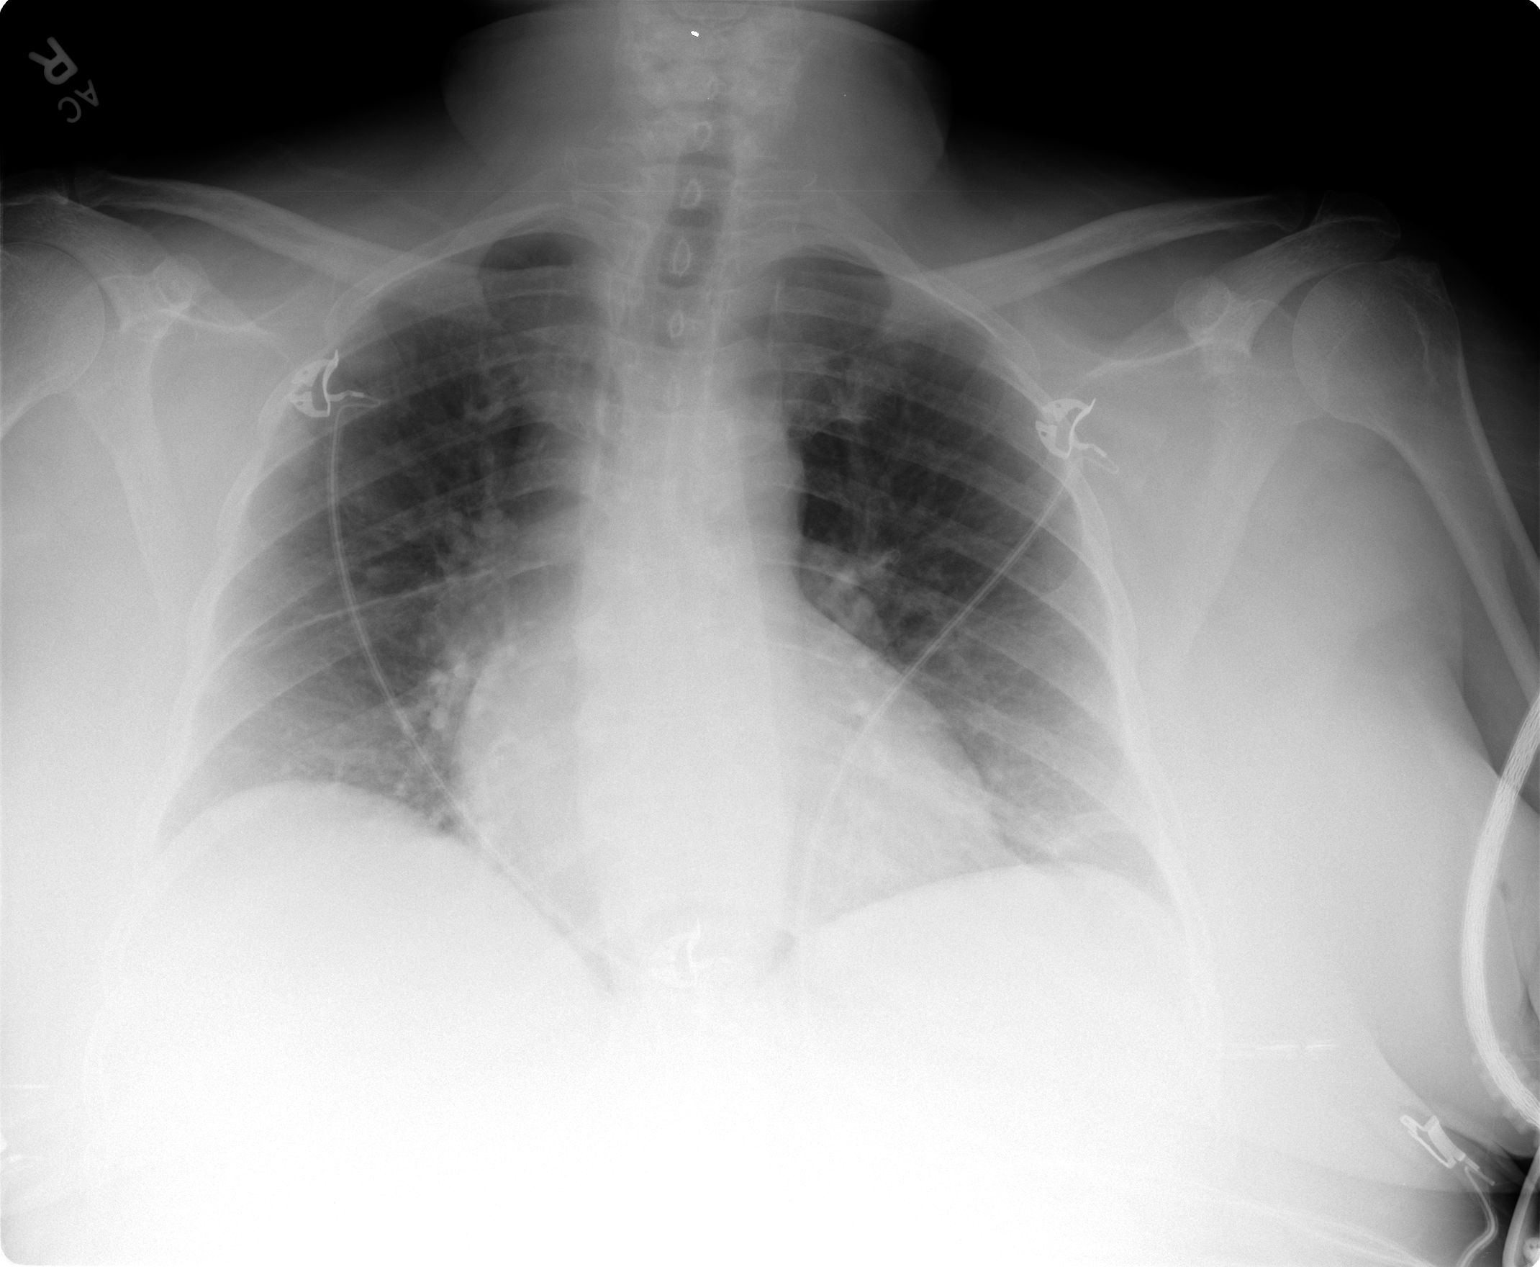

[1 of 1 positions shown; findings below may reference images not displayed]

FINDINGS: The cardiac silhouette remains borderline enlarged.
Interval mild left basilar atelectasis.  Stable minimal diffuse
peribronchial thickening.  Thoracic spine degenerative changes and
mild scoliosis, stable.
IMPRESSION: 1.  Mild left basilar atelectasis.
2.  Stable borderline cardiomegaly and minimal chronic bronchitic
changes.

## 2009-09-01 ENCOUNTER — Telehealth: Payer: Self-pay | Admitting: Family Medicine

## 2009-09-01 ENCOUNTER — Encounter: Payer: Self-pay | Admitting: Urgent Care

## 2009-09-02 ENCOUNTER — Encounter: Payer: Self-pay | Admitting: Family Medicine

## 2009-09-03 ENCOUNTER — Ambulatory Visit: Payer: Self-pay | Admitting: Family Medicine

## 2009-09-03 DIAGNOSIS — J019 Acute sinusitis, unspecified: Secondary | ICD-10-CM

## 2009-09-07 ENCOUNTER — Encounter: Payer: Self-pay | Admitting: Family Medicine

## 2009-09-09 ENCOUNTER — Telehealth: Payer: Self-pay | Admitting: Family Medicine

## 2009-09-11 ENCOUNTER — Emergency Department (HOSPITAL_COMMUNITY)
Admission: EM | Admit: 2009-09-11 | Discharge: 2009-09-12 | Payer: Self-pay | Source: Home / Self Care | Admitting: Emergency Medicine

## 2009-09-14 ENCOUNTER — Ambulatory Visit: Payer: Self-pay | Admitting: Family Medicine

## 2009-09-14 LAB — HM DIABETES FOOT EXAM

## 2009-09-17 ENCOUNTER — Telehealth: Payer: Self-pay | Admitting: Family Medicine

## 2009-09-17 LAB — CONVERTED CEMR LAB
Alkaline Phosphatase: 113 units/L (ref 39–117)
BUN: 12 mg/dL (ref 6–23)
Bilirubin, Direct: 0.1 mg/dL (ref 0.0–0.3)
CO2: 22 meq/L (ref 19–32)
Chloride: 109 meq/L (ref 96–112)
Creatinine, Ser: 0.67 mg/dL (ref 0.40–1.20)
Glucose, Bld: 117 mg/dL — ABNORMAL HIGH (ref 70–99)
Helicobacter Pylori Antibody-IgG: 0.4
Hgb A1c MFr Bld: 8.2 % — ABNORMAL HIGH (ref <5.7)
Indirect Bilirubin: 0.2 mg/dL (ref 0.0–0.9)
LDL Cholesterol: 73 mg/dL (ref 0–99)
Potassium: 4.3 meq/L (ref 3.5–5.3)
Total Bilirubin: 0.3 mg/dL (ref 0.3–1.2)
Total Protein: 7.5 g/dL (ref 6.0–8.3)
VLDL: 42 mg/dL — ABNORMAL HIGH (ref 0–40)

## 2009-09-20 ENCOUNTER — Telehealth: Payer: Self-pay | Admitting: Physician Assistant

## 2009-09-21 ENCOUNTER — Encounter: Payer: Self-pay | Admitting: Internal Medicine

## 2009-09-21 ENCOUNTER — Telehealth (INDEPENDENT_AMBULATORY_CARE_PROVIDER_SITE_OTHER): Payer: Self-pay

## 2009-09-21 DIAGNOSIS — R197 Diarrhea, unspecified: Secondary | ICD-10-CM | POA: Insufficient documentation

## 2009-09-23 ENCOUNTER — Ambulatory Visit: Payer: Self-pay | Admitting: Internal Medicine

## 2009-09-23 DIAGNOSIS — K921 Melena: Secondary | ICD-10-CM

## 2009-09-27 ENCOUNTER — Encounter: Payer: Self-pay | Admitting: Family Medicine

## 2009-09-28 ENCOUNTER — Ambulatory Visit: Admission: RE | Admit: 2009-09-28 | Discharge: 2009-09-28 | Payer: Self-pay | Admitting: Family Medicine

## 2009-10-04 ENCOUNTER — Encounter: Payer: Self-pay | Admitting: Family Medicine

## 2009-10-11 ENCOUNTER — Ambulatory Visit: Payer: Self-pay | Admitting: Internal Medicine

## 2009-10-11 ENCOUNTER — Ambulatory Visit (HOSPITAL_COMMUNITY): Admission: RE | Admit: 2009-10-11 | Discharge: 2009-10-11 | Payer: Self-pay | Admitting: Internal Medicine

## 2009-10-13 ENCOUNTER — Telehealth (INDEPENDENT_AMBULATORY_CARE_PROVIDER_SITE_OTHER): Payer: Self-pay

## 2009-10-18 ENCOUNTER — Ambulatory Visit: Payer: Self-pay | Admitting: Family Medicine

## 2009-10-18 ENCOUNTER — Telehealth: Payer: Self-pay | Admitting: Family Medicine

## 2009-10-18 DIAGNOSIS — R599 Enlarged lymph nodes, unspecified: Secondary | ICD-10-CM | POA: Insufficient documentation

## 2009-10-19 ENCOUNTER — Encounter: Payer: Self-pay | Admitting: Urgent Care

## 2009-10-19 ENCOUNTER — Encounter: Payer: Self-pay | Admitting: Family Medicine

## 2009-10-20 ENCOUNTER — Encounter: Payer: Self-pay | Admitting: Family Medicine

## 2009-10-22 ENCOUNTER — Telehealth: Payer: Self-pay | Admitting: Family Medicine

## 2009-10-26 ENCOUNTER — Ambulatory Visit: Payer: Self-pay | Admitting: Family Medicine

## 2009-10-26 DIAGNOSIS — H6690 Otitis media, unspecified, unspecified ear: Secondary | ICD-10-CM | POA: Insufficient documentation

## 2009-10-26 DIAGNOSIS — H9190 Unspecified hearing loss, unspecified ear: Secondary | ICD-10-CM | POA: Insufficient documentation

## 2009-10-27 LAB — CONVERTED CEMR LAB
Basophils Absolute: 0 10*3/uL (ref 0.0–0.1)
HCT: 38.5 % (ref 36.0–46.0)
Hemoglobin: 11.4 g/dL — ABNORMAL LOW (ref 12.0–15.0)
Iron: 54 ug/dL (ref 42–145)
Lymphocytes Relative: 39 % (ref 12–46)
Lymphs Abs: 3 10*3/uL (ref 0.7–4.0)
Monocytes Absolute: 0.6 10*3/uL (ref 0.1–1.0)
Neutro Abs: 4 10*3/uL (ref 1.7–7.7)
RDW: 15.5 % (ref 11.5–15.5)
WBC: 7.7 10*3/uL (ref 4.0–10.5)

## 2009-10-31 ENCOUNTER — Emergency Department (HOSPITAL_COMMUNITY): Admission: EM | Admit: 2009-10-31 | Discharge: 2009-10-31 | Payer: Self-pay | Admitting: Emergency Medicine

## 2009-11-01 ENCOUNTER — Telehealth: Payer: Self-pay | Admitting: Family Medicine

## 2009-11-03 IMAGING — CT CT HEAD W/O CM
1 series · 16 of 30 positions shown, 20 images · non-contrast
Comparison: 06/10/2006 and earlier.

CLINICAL DATA: 57-year-old female with fall, headache.

CT HEAD WITHOUT CONTRAST
TECHNIQUE: Contiguous axial images were obtained from the base of
the skull through the vertex without contrast.

[Series 2: headtrauma 4.8 h37s · axial · 0.43mm/px · z∈[+66,+198]mm · 16 of 30 slices shown, 20 images]
[im 2/30  brain]
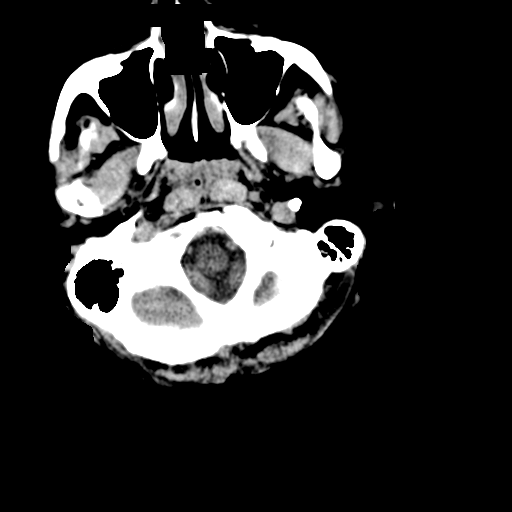
[im 2/30  bone]
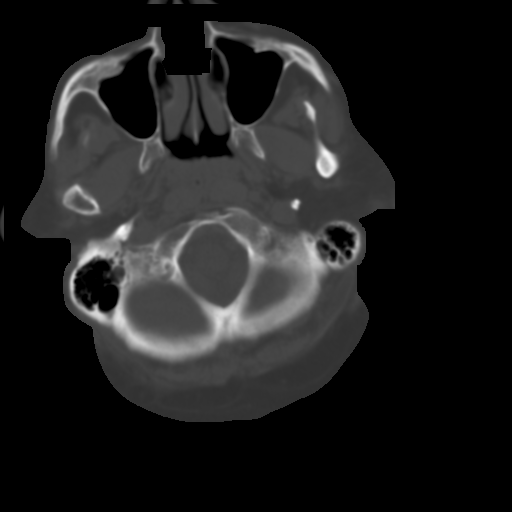
[im 4/30  brain]
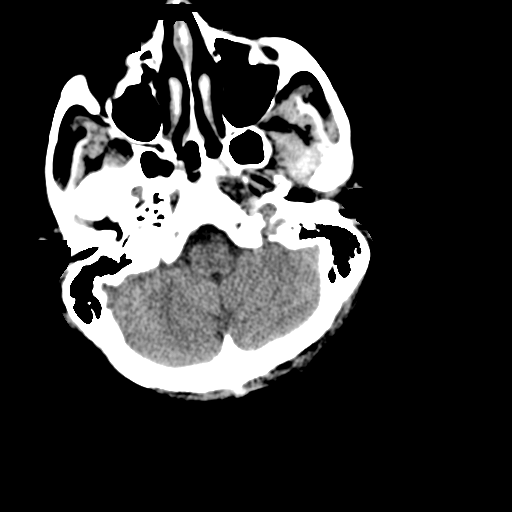
[im 6/30  brain]
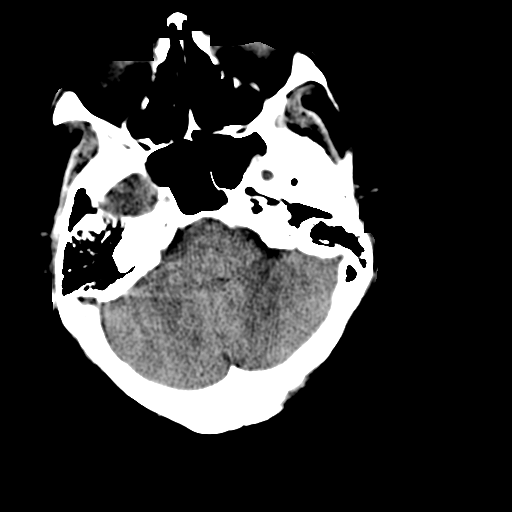
[im 8/30  brain]
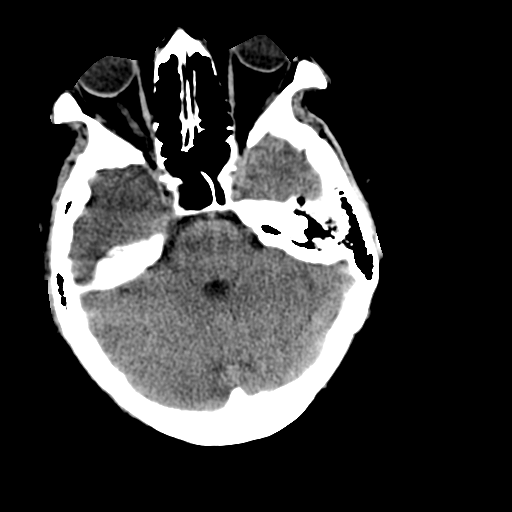
[im 9/30  brain]
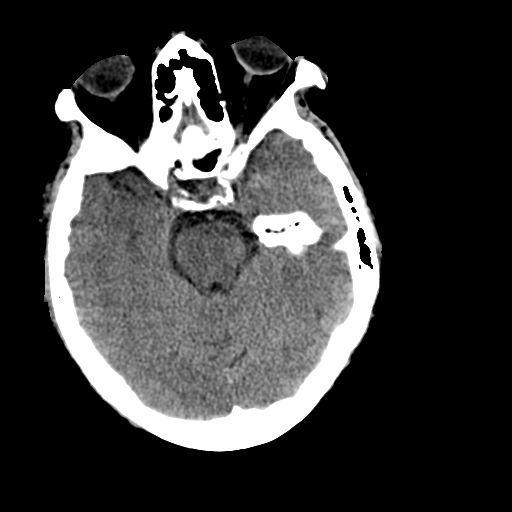
[im 9/30  bone]
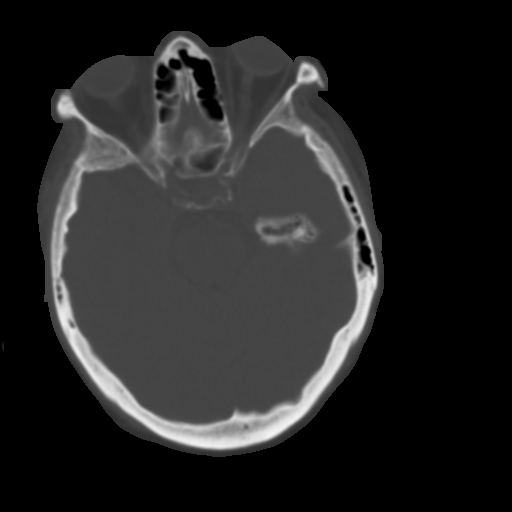
[im 11/30  brain]
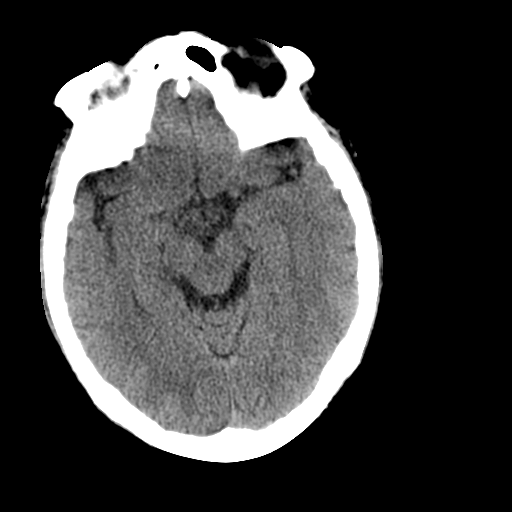
[im 13/30  brain]
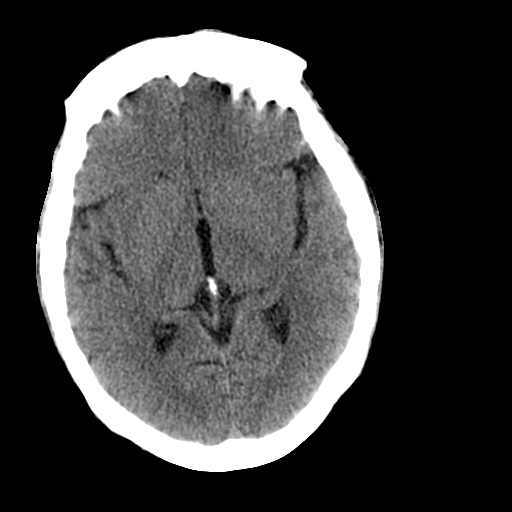
[im 15/30  brain]
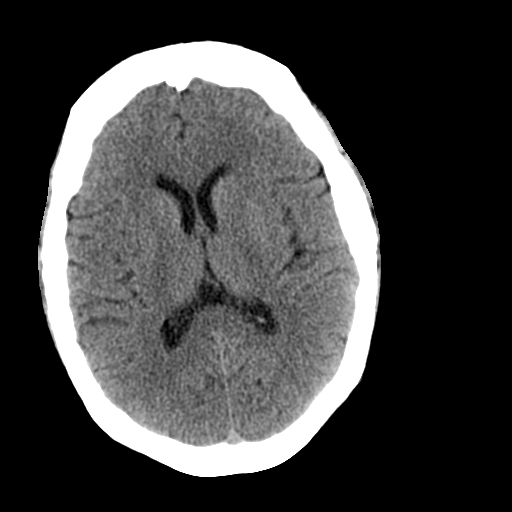
[im 16/30  brain]
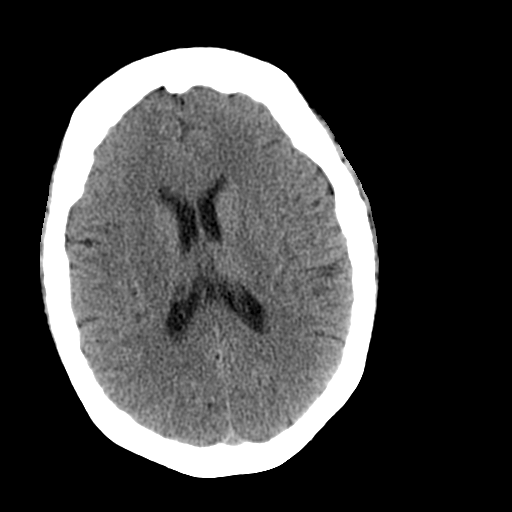
[im 16/30  bone]
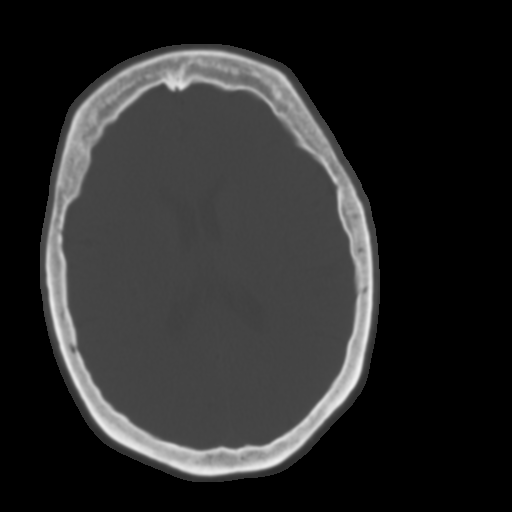
[im 18/30  brain]
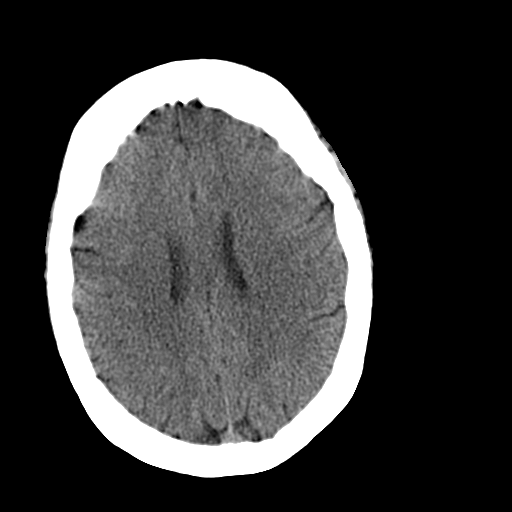
[im 20/30  brain]
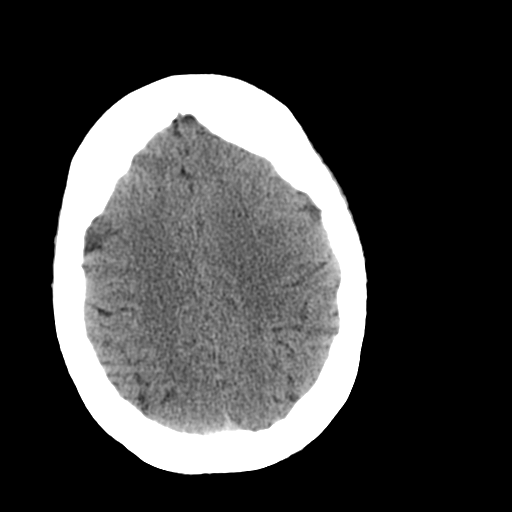
[im 22/30  brain]
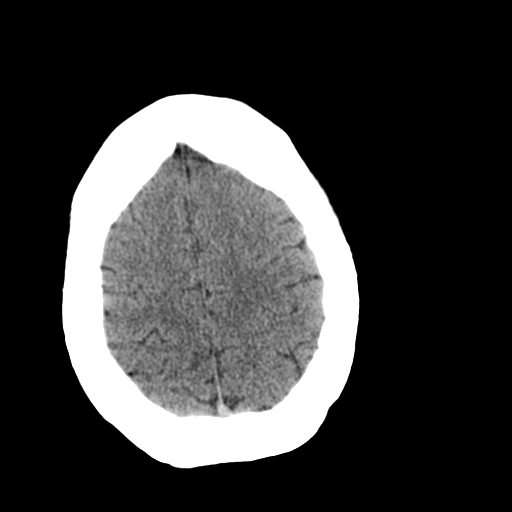
[im 23/30  brain]
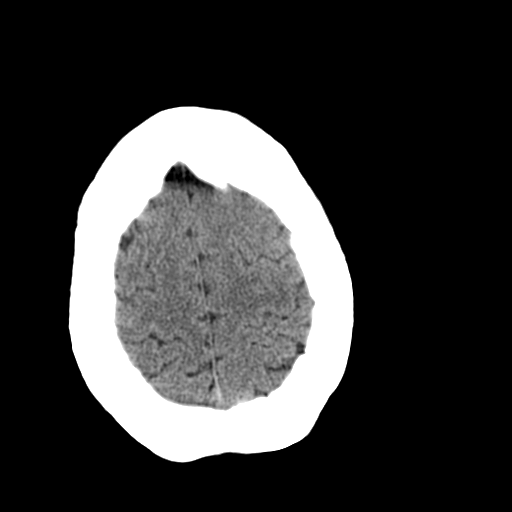
[im 23/30  bone]
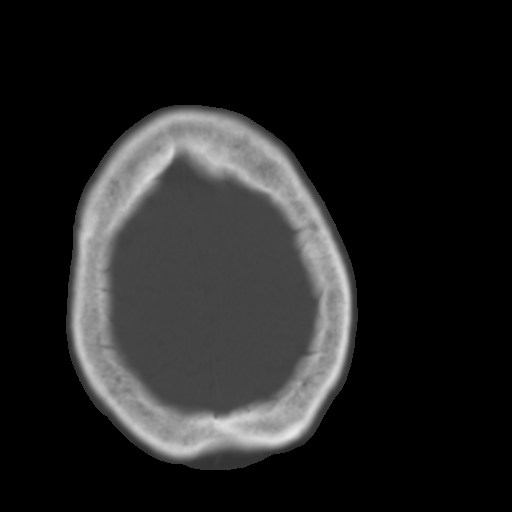
[im 25/30  brain]
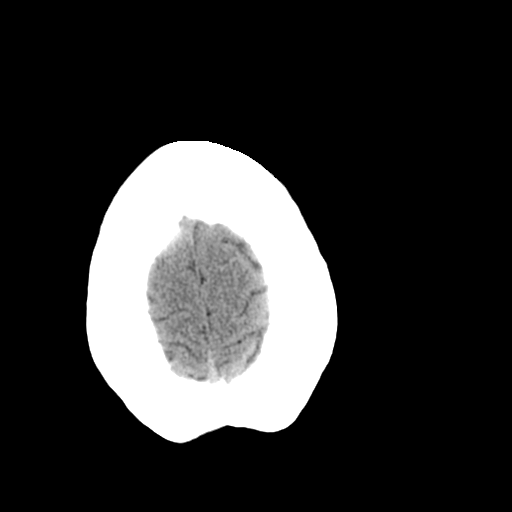
[im 27/30  brain]
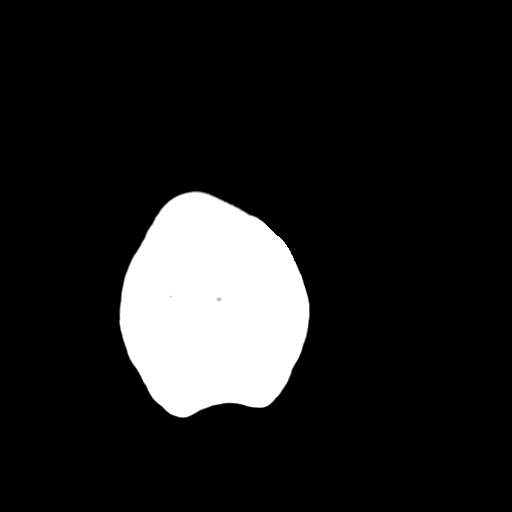
[im 29/30  brain]
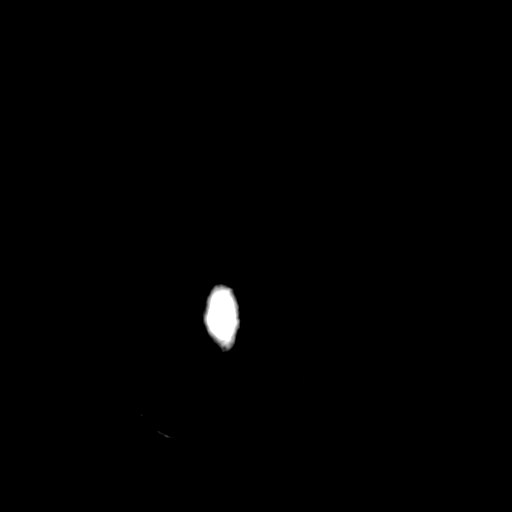

[16 of 30 positions shown; findings below may reference images not displayed]

FINDINGS: Visualized orbits and scalp soft tissues are within
normal limits.  Calcified atherosclerosis at the skull base.
Visualized paranasal sinuses and mastoids are clear.  No acute
osseous abnormality identified.

Stable cerebral volume.  Stable ventricle size and configuration.
No midline shift or mass effect. Stable gray-white matter
differentiation throughout the brain.  Hypodensity re-identified in
the pons. No evidence of acute cortically based infarct identified.
No acute intracranial hemorrhage identified.
IMPRESSION: No acute intracranial abnormality.

## 2009-11-08 ENCOUNTER — Telehealth: Payer: Self-pay | Admitting: Physician Assistant

## 2009-11-10 ENCOUNTER — Telehealth: Payer: Self-pay | Admitting: Family Medicine

## 2009-11-12 ENCOUNTER — Encounter: Payer: Self-pay | Admitting: Internal Medicine

## 2009-11-13 IMAGING — US US MISC SOFT TISSUE
1 series · 14 of 16 positions shown · non-contrast
Comparison: Opposite thigh was also performed for comparison.

CLINICAL DATA: Right thigh mass

ULTRASOUND OF  SOFT TISSUES
TECHNIQUE: Ultrasound examination of the head and neck soft tissues
was performed in the area of clinical concern.

[Series 1: unknown · 0.06mm/px · 14 of 16 slices shown]
[im 1/16]
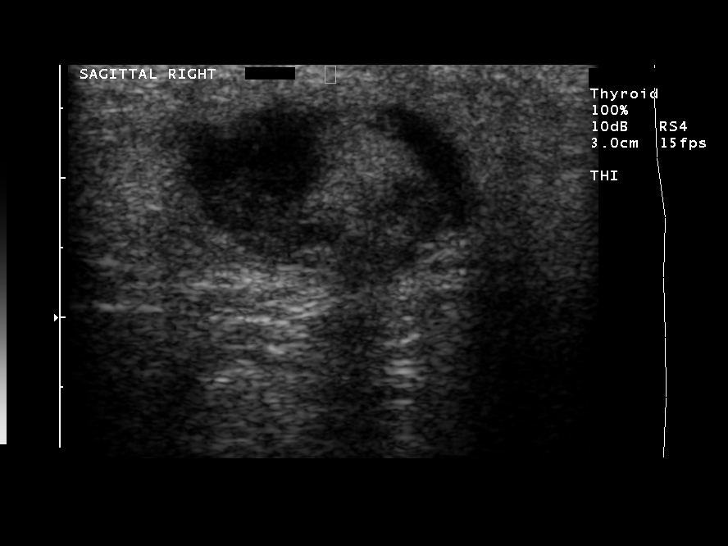
[im 2/16]
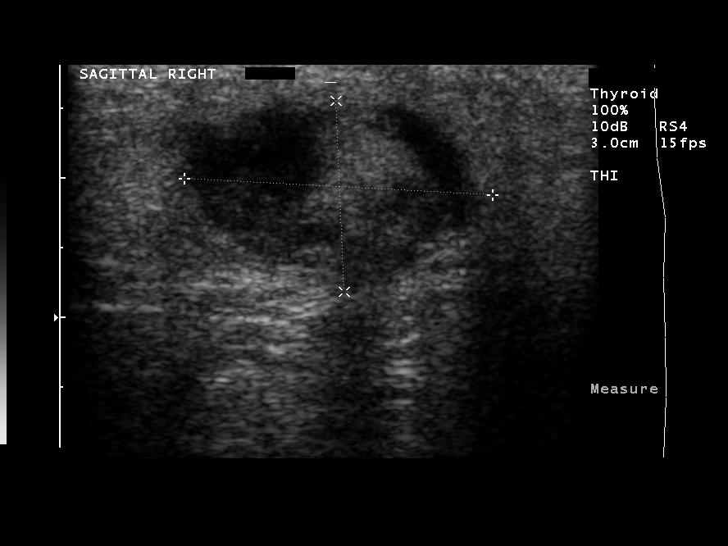
[im 3/16]
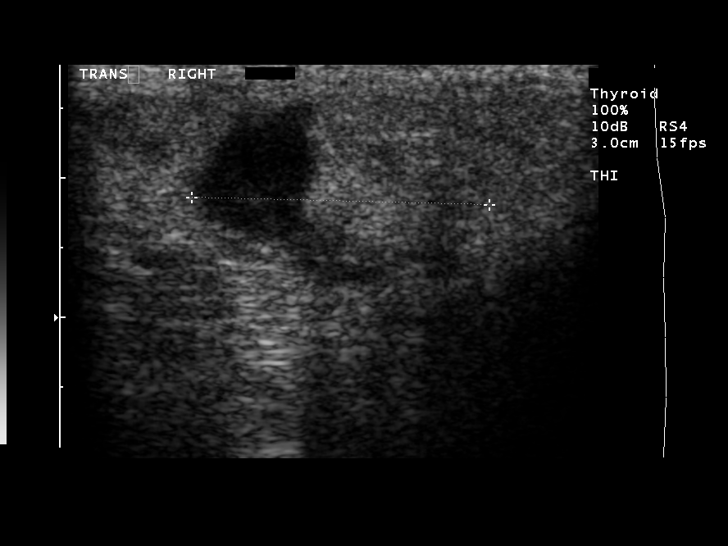
[im 5/16]
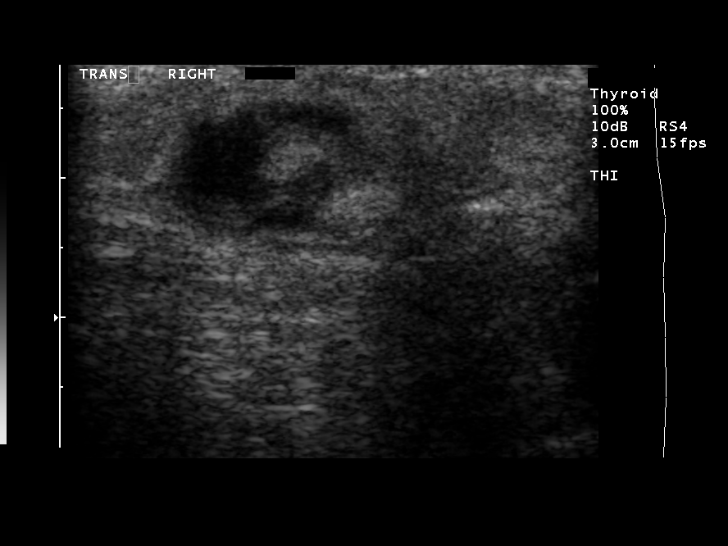
[im 6/16]
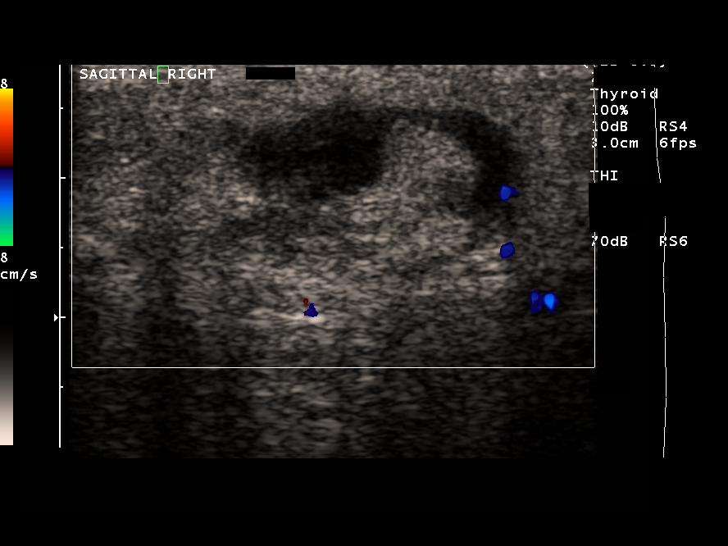
[im 7/16]
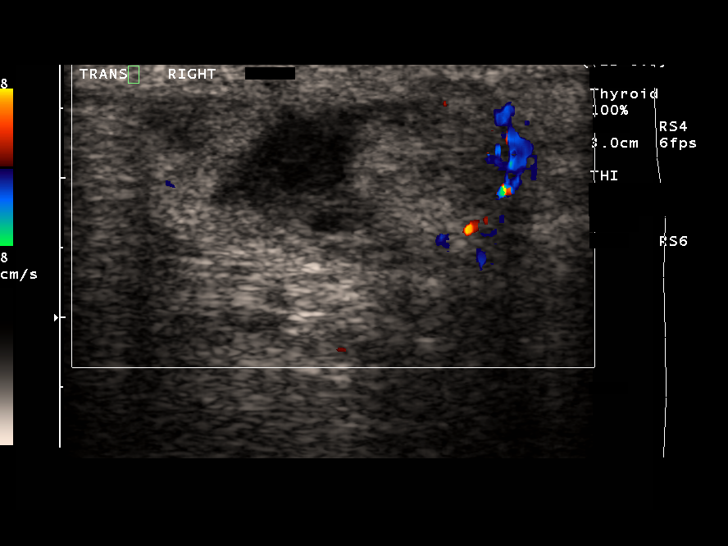
[im 8/16]
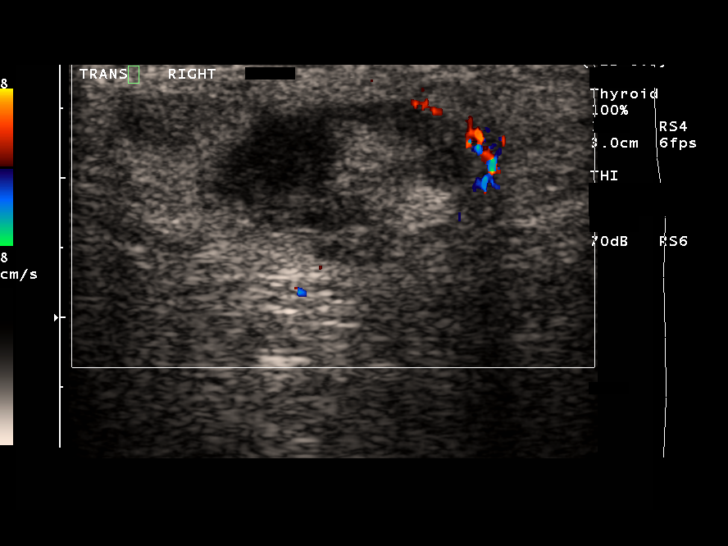
[im 9/16]
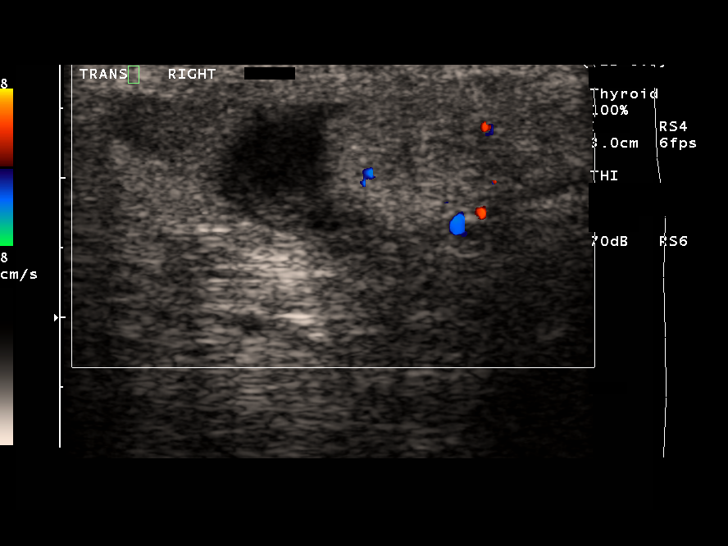
[im 10/16]
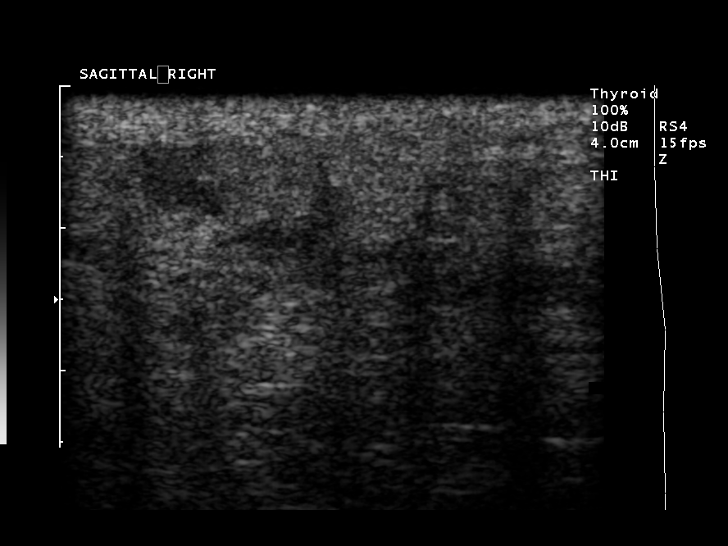
[im 11/16]
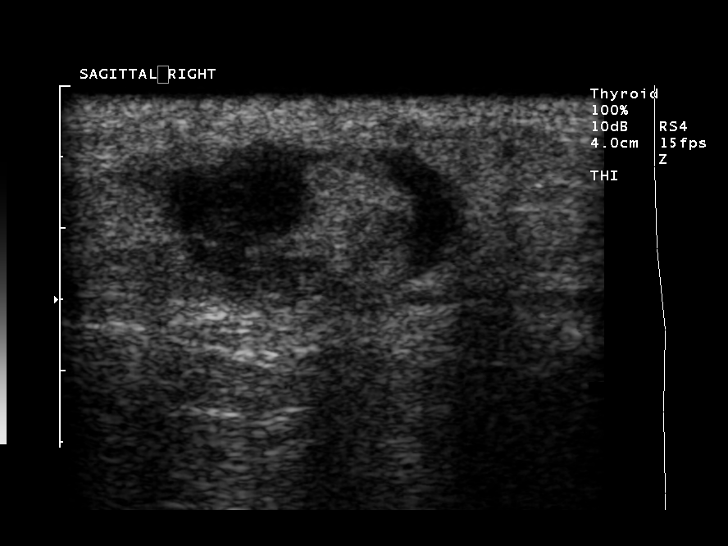
[im 13/16]
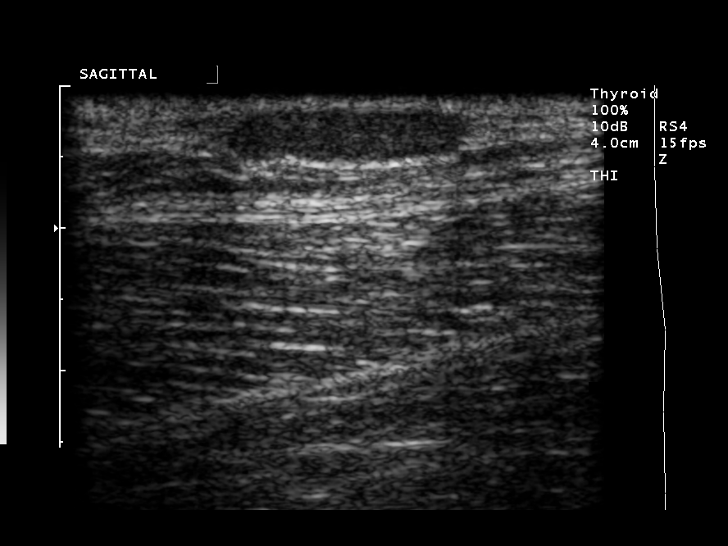
[im 14/16]
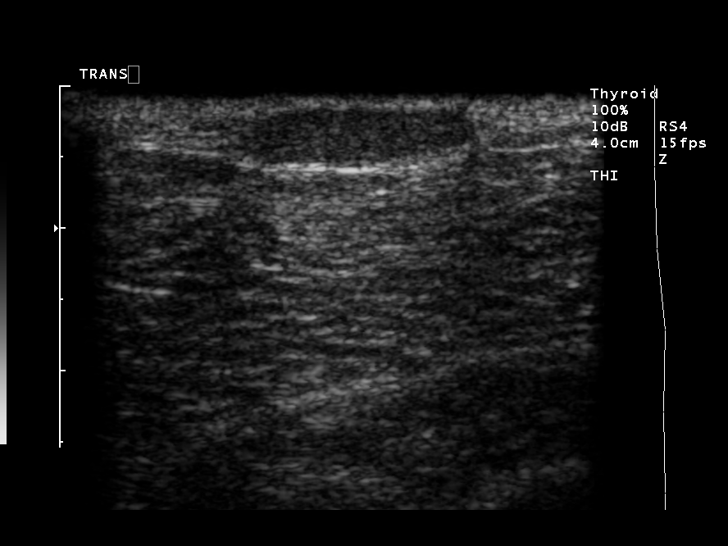
[im 15/16]
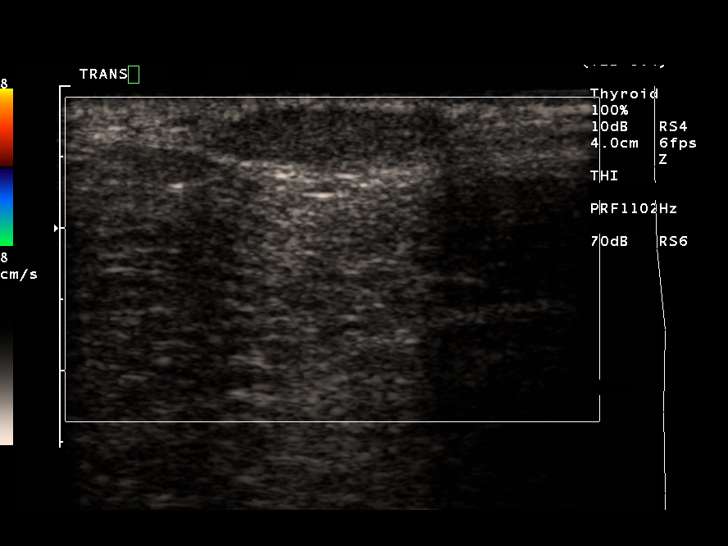
[im 16/16]
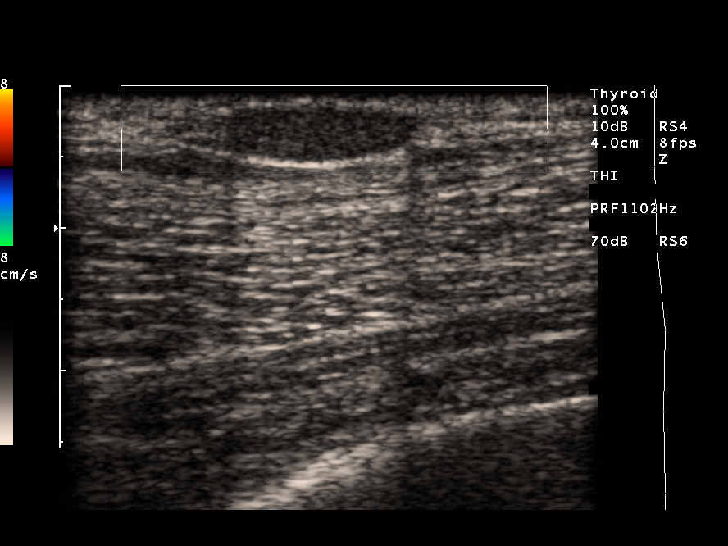

[14 of 16 positions shown; findings below may reference images not displayed]

FINDINGS: Within the right lateral thigh, in the area of concern,
there is a mixed density subcutaneous nodule.  This measures 2.2 x
1.3 x 2.1 cm.  This is of unknown etiology.  Possible differential
considerations would include hematoma, abscess, lymph node.  In a
similar location in the left thigh, there is a hypoechoic nodule
measuring up to 1.5 cm.
IMPRESSION: Mixed density 2.2 cm nodule in the subcutaneous soft tissues of the
lateral right thigh in the area of concern with differential
considerations as above.  Smaller nodule is seen in a similar
location on the opposite (left) thigh.  Recommend close clinical
follow-up.  If clinical concern persists, MRI or tissue sampling
may be helpful.

REF:A1 DICTATED: 04/20/2008 [DATE]

## 2009-11-16 ENCOUNTER — Telehealth: Payer: Self-pay | Admitting: Family Medicine

## 2009-11-16 ENCOUNTER — Ambulatory Visit: Payer: Self-pay | Admitting: Family Medicine

## 2009-11-19 ENCOUNTER — Encounter: Payer: Self-pay | Admitting: Gastroenterology

## 2009-11-21 ENCOUNTER — Emergency Department (HOSPITAL_COMMUNITY)
Admission: EM | Admit: 2009-11-21 | Discharge: 2009-11-21 | Payer: Self-pay | Source: Home / Self Care | Admitting: Emergency Medicine

## 2009-11-23 ENCOUNTER — Encounter: Payer: Self-pay | Admitting: Family Medicine

## 2009-11-29 ENCOUNTER — Encounter: Payer: Self-pay | Admitting: Family Medicine

## 2009-11-30 ENCOUNTER — Encounter: Payer: Self-pay | Admitting: Family Medicine

## 2009-12-02 ENCOUNTER — Telehealth: Payer: Self-pay | Admitting: Family Medicine

## 2009-12-02 ENCOUNTER — Encounter: Payer: Self-pay | Admitting: Family Medicine

## 2009-12-12 IMAGING — CR DG CHEST 2V
2 series · 2 of 2 positions shown · non-contrast
Comparison: Portable chest x-ray of 02/02/2008

CLINICAL DATA: Preop for surgery on gums, fell today

CHEST - 2 VIEW

[view not recorded (1 of 2)]
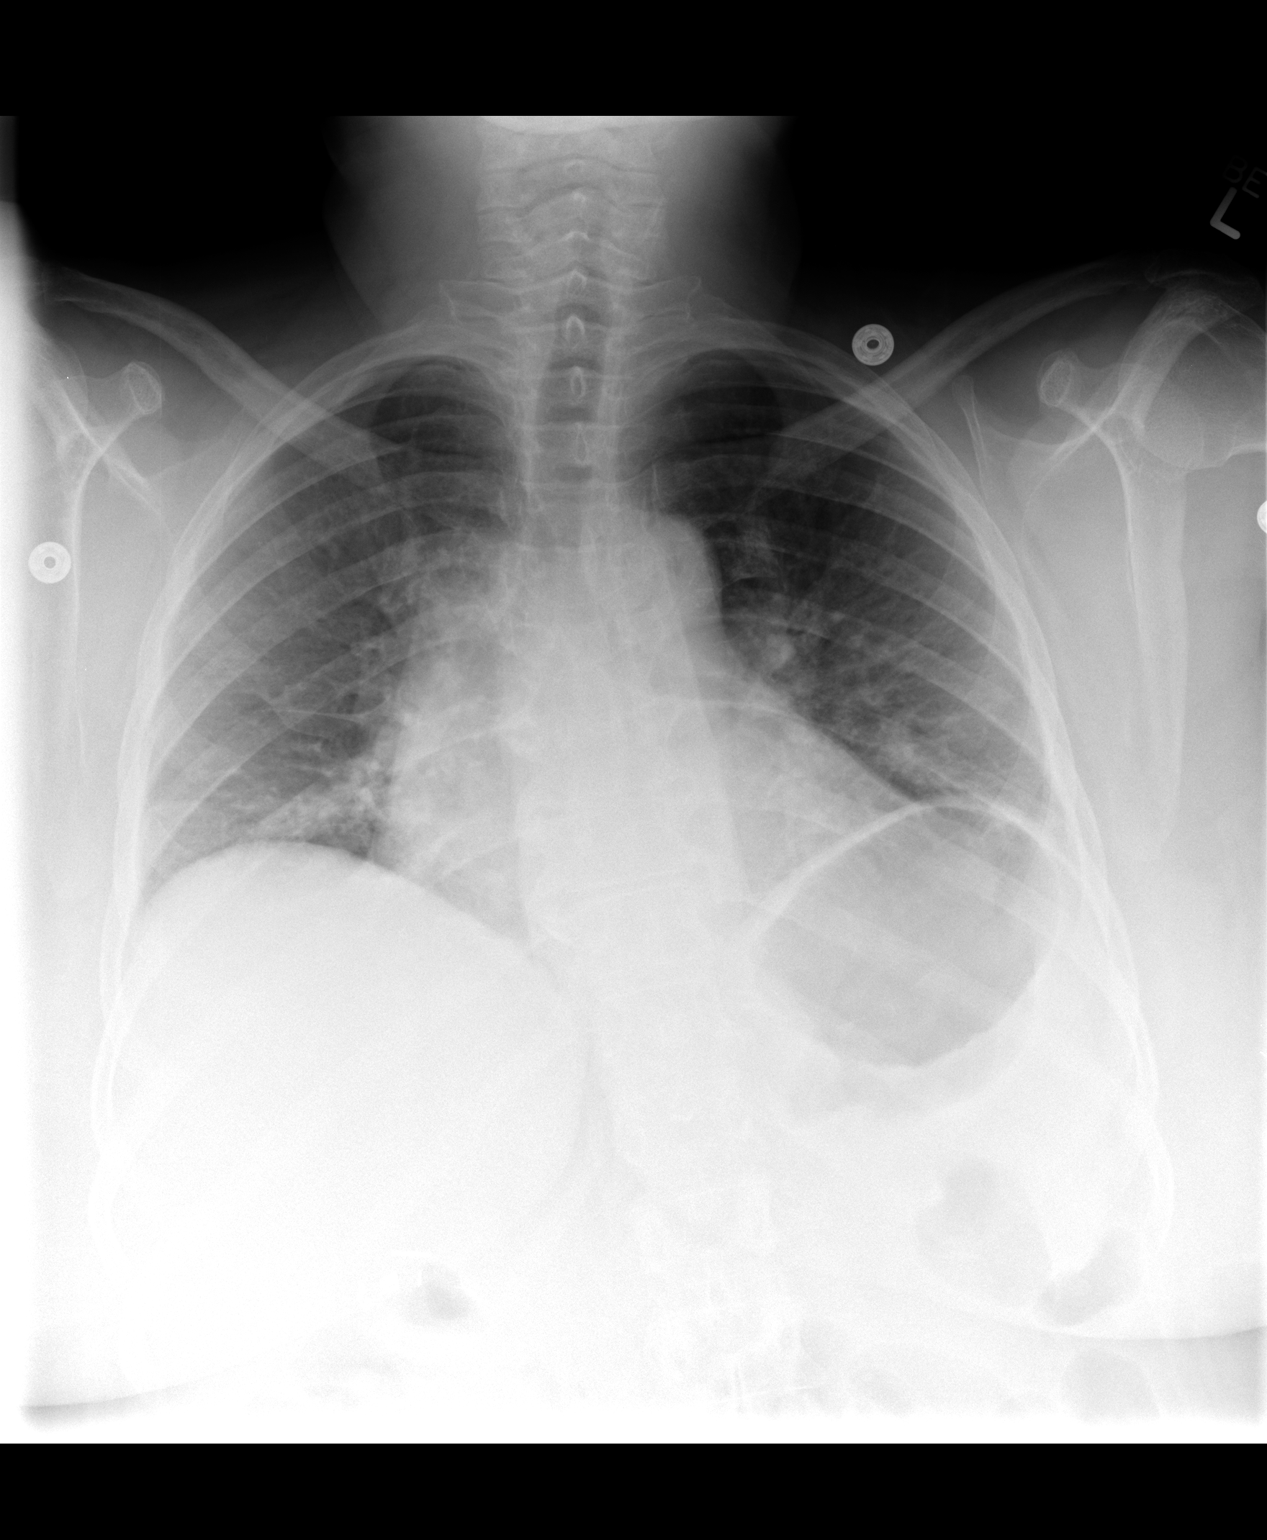

[view not recorded (2 of 2)]
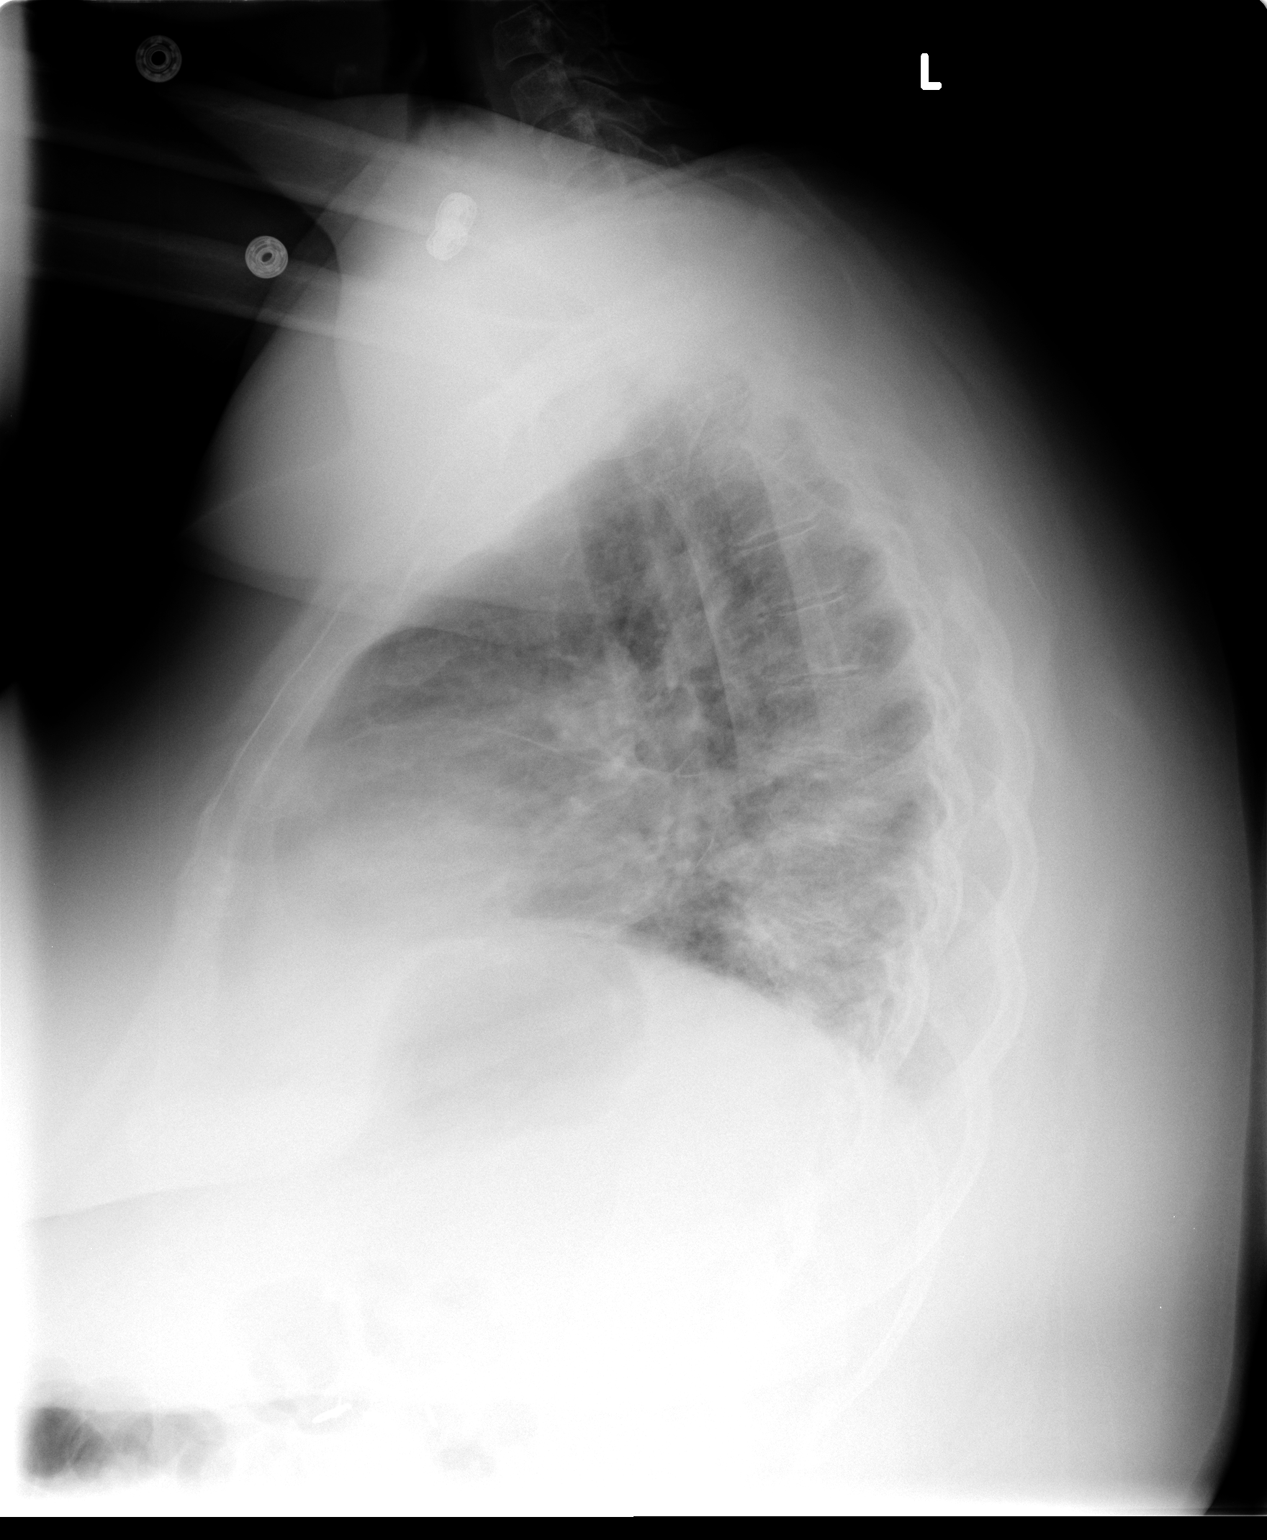

[2 of 2 positions shown; findings below may reference images not displayed]

FINDINGS: There is mild bibasilar linear atelectasis present.  No
active infiltrate or effusion is seen.  Heart is mildly enlarged.
There are degenerative changes throughout the thoracic spine.
IMPRESSION: Probable bibasilar linear atelectasis or scarring.  No definite
active process.

## 2009-12-15 ENCOUNTER — Telehealth: Payer: Self-pay | Admitting: Family Medicine

## 2009-12-15 ENCOUNTER — Telehealth (INDEPENDENT_AMBULATORY_CARE_PROVIDER_SITE_OTHER): Payer: Self-pay

## 2009-12-16 ENCOUNTER — Ambulatory Visit: Payer: Self-pay | Admitting: Family Medicine

## 2009-12-16 ENCOUNTER — Ambulatory Visit: Payer: Self-pay | Admitting: Otolaryngology

## 2009-12-16 DIAGNOSIS — R32 Unspecified urinary incontinence: Secondary | ICD-10-CM

## 2009-12-18 ENCOUNTER — Emergency Department (HOSPITAL_COMMUNITY): Admission: EM | Admit: 2009-12-18 | Discharge: 2009-12-18 | Payer: Self-pay | Admitting: Emergency Medicine

## 2009-12-21 ENCOUNTER — Ambulatory Visit: Payer: Self-pay | Admitting: Family Medicine

## 2009-12-22 LAB — CONVERTED CEMR LAB
BUN: 21 mg/dL (ref 6–23)
CO2: 25 meq/L (ref 19–32)
Chloride: 105 meq/L (ref 96–112)
Potassium: 4.7 meq/L (ref 3.5–5.3)

## 2009-12-24 ENCOUNTER — Telehealth: Payer: Self-pay | Admitting: Family Medicine

## 2009-12-27 ENCOUNTER — Ambulatory Visit: Payer: Self-pay | Admitting: Gastroenterology

## 2009-12-28 ENCOUNTER — Telehealth: Payer: Self-pay | Admitting: Family Medicine

## 2010-01-10 ENCOUNTER — Telehealth: Payer: Self-pay | Admitting: Family Medicine

## 2010-01-11 ENCOUNTER — Encounter: Payer: Self-pay | Admitting: Gastroenterology

## 2010-01-17 ENCOUNTER — Telehealth: Payer: Self-pay | Admitting: Family Medicine

## 2010-01-17 LAB — CONVERTED CEMR LAB
Eosinophils Absolute: 0.2 10*3/uL (ref 0.0–0.7)
Eosinophils Relative: 3 % (ref 0–5)
HCT: 35.7 % — ABNORMAL LOW (ref 36.0–46.0)
Lymphs Abs: 2.7 10*3/uL (ref 0.7–4.0)
MCHC: 29.7 g/dL — ABNORMAL LOW (ref 30.0–36.0)
MCV: 74.5 fL — ABNORMAL LOW (ref 78.0–100.0)
Monocytes Relative: 9 % (ref 3–12)
RBC: 4.79 M/uL (ref 3.87–5.11)
WBC: 6.8 10*3/uL (ref 4.0–10.5)

## 2010-01-18 ENCOUNTER — Encounter: Payer: Self-pay | Admitting: Urgent Care

## 2010-01-19 ENCOUNTER — Encounter: Payer: Self-pay | Admitting: Gastroenterology

## 2010-01-21 ENCOUNTER — Encounter: Payer: Self-pay | Admitting: Urgent Care

## 2010-01-25 ENCOUNTER — Encounter: Payer: Self-pay | Admitting: Family Medicine

## 2010-01-25 ENCOUNTER — Telehealth: Payer: Self-pay | Admitting: Family Medicine

## 2010-01-26 ENCOUNTER — Ambulatory Visit (HOSPITAL_COMMUNITY): Admission: RE | Admit: 2010-01-26 | Discharge: 2010-01-26 | Payer: Self-pay | Admitting: Orthopedic Surgery

## 2010-01-26 DIAGNOSIS — N644 Mastodynia: Secondary | ICD-10-CM

## 2010-01-28 ENCOUNTER — Telehealth: Payer: Self-pay | Admitting: Family Medicine

## 2010-01-28 IMAGING — CR DG CHEST 1V
1 series · 1 of 1 positions shown · non-contrast
Comparison: 05/19/2008

CLINICAL DATA: Mental status change.

CHEST - 1 VIEW

[view not recorded]
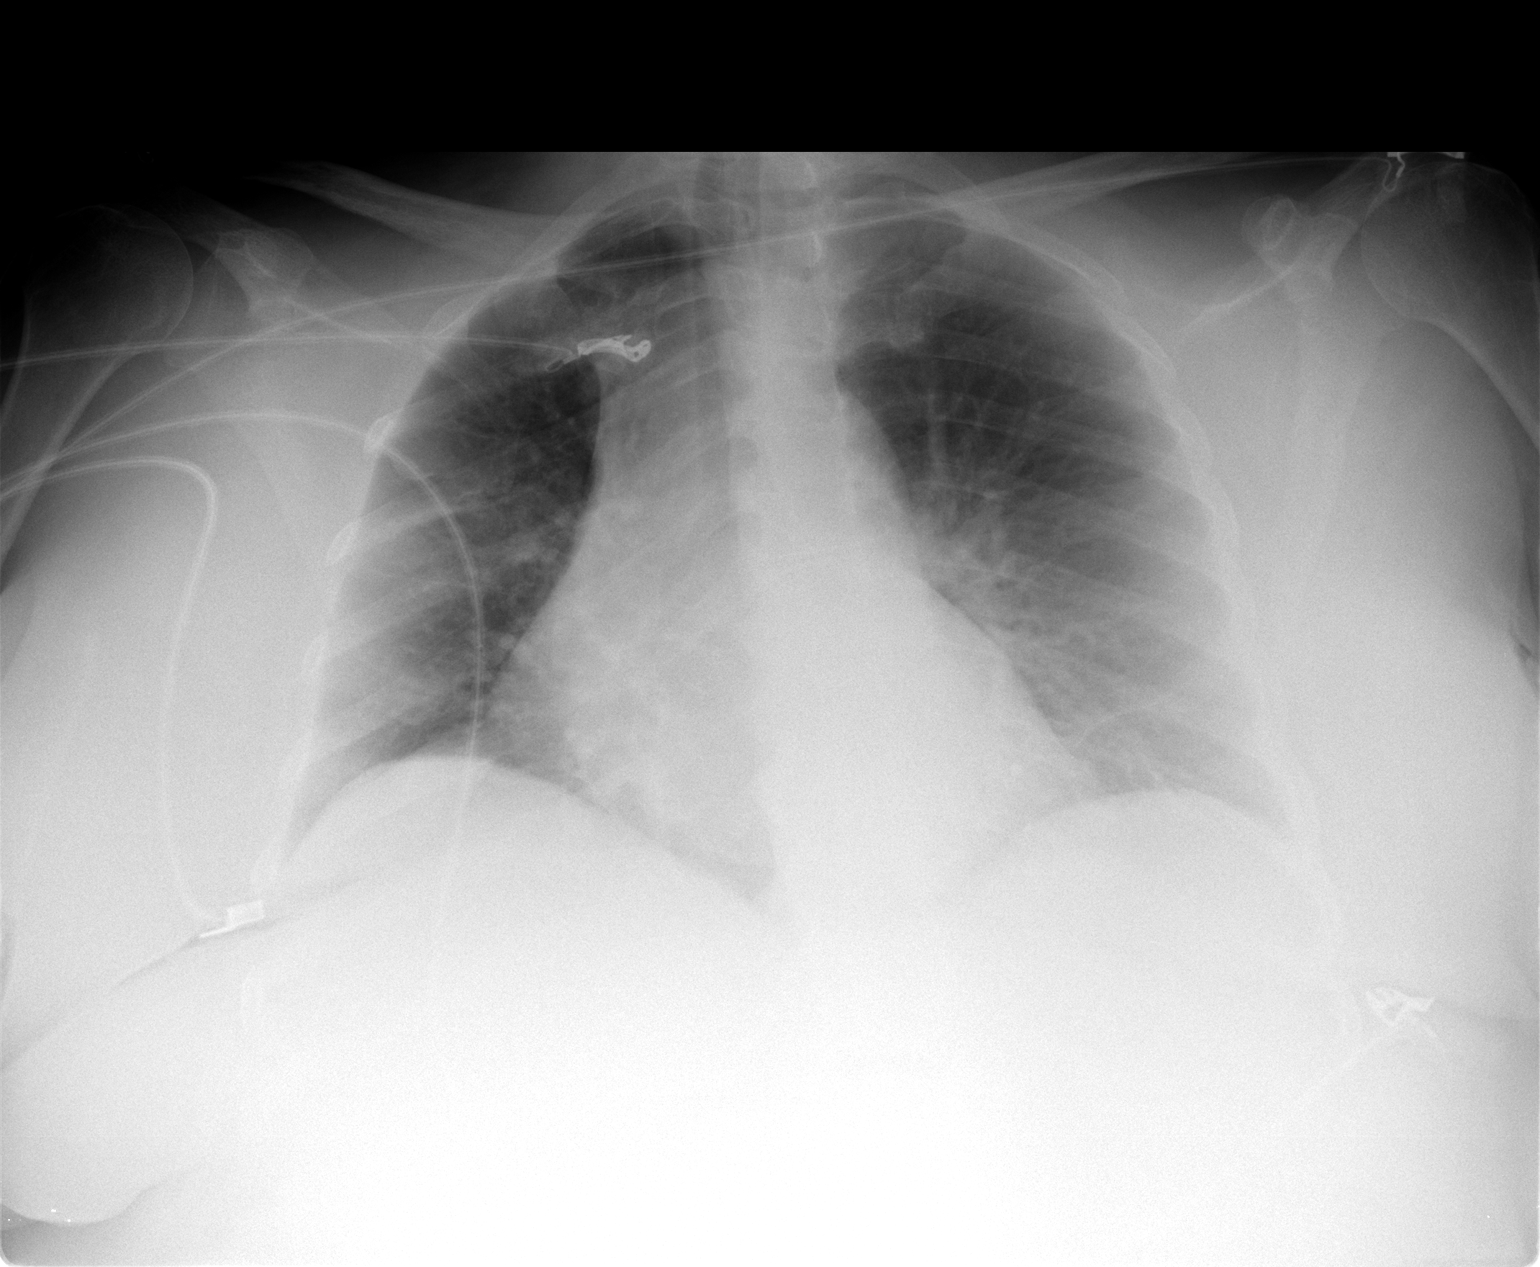

[1 of 1 positions shown; findings below may reference images not displayed]

FINDINGS: The heart is mildly enlarged but stable.  Mediastinal and
hilar contours are unchanged.  Mild central vascular congestion but
no infiltrates, edema or effusions.  The bony thorax is intact.
IMPRESSION: 1.  Mild cardiac enlargement and central vascular congestion.
2.  No infiltrates, edema or effusions.

## 2010-01-28 IMAGING — CT CT HEAD W/O CM
1 of 2 series · 16 of 30 positions shown, 20 images · non-contrast
Comparison: 04/10/2008

CLINICAL DATA: Dizziness and slurred speech.

CT HEAD WITHOUT CONTRAST
TECHNIQUE: Contiguous axial images were obtained from the base of
the skull through the vertex without contrast.

[Series 2: headseq 4.8 h37s · axial · 0.45mm/px · z∈[+1150,+1280]mm · 16 of 30 slices shown, 20 images]
[im 2/30  brain]
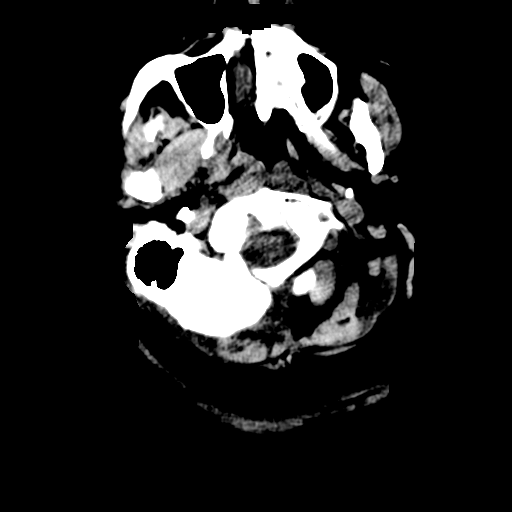
[im 2/30  bone]
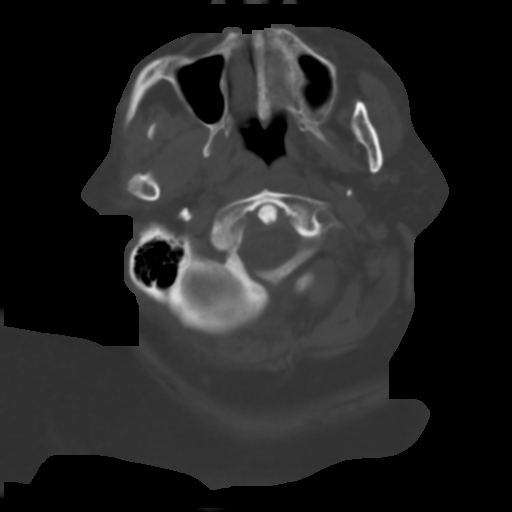
[im 4/30  brain]
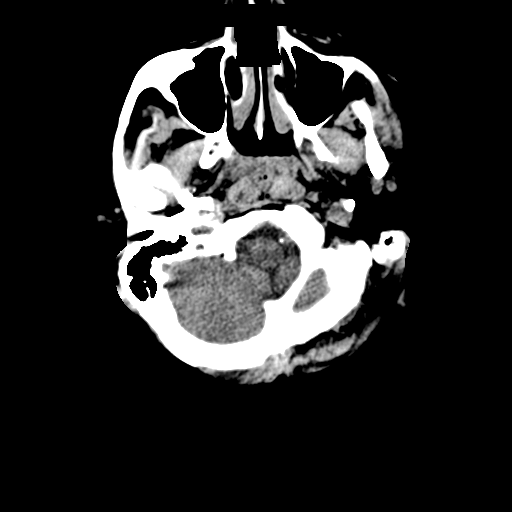
[im 5/30  brain]
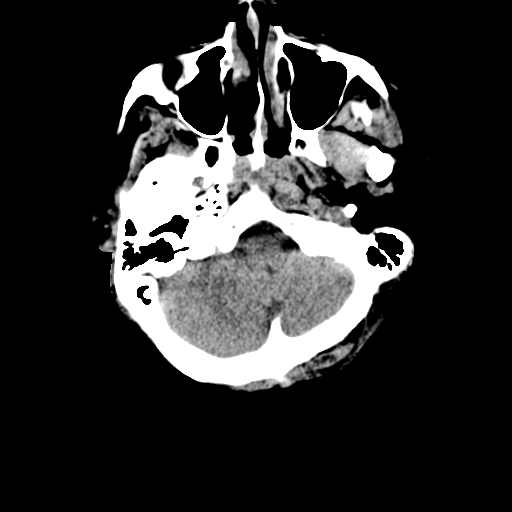
[im 7/30  brain]
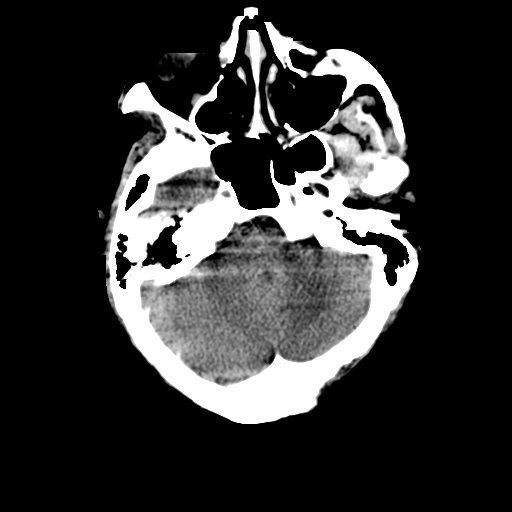
[im 9/30  brain]
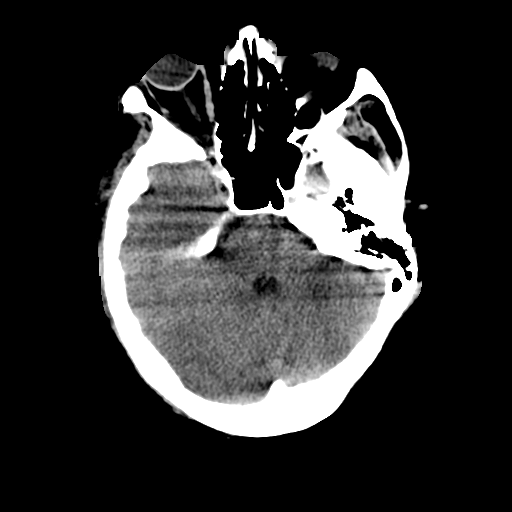
[im 9/30  bone]
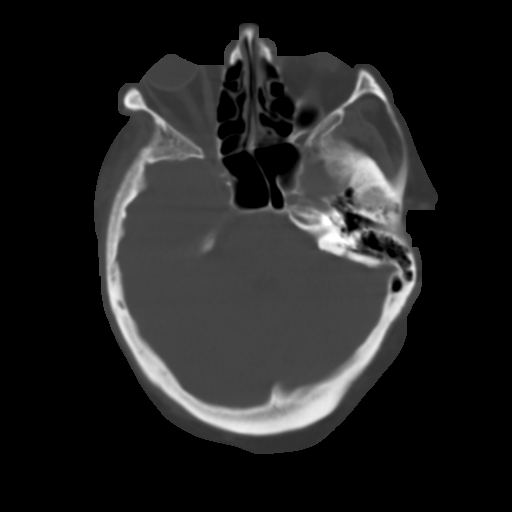
[im 10/30  brain]
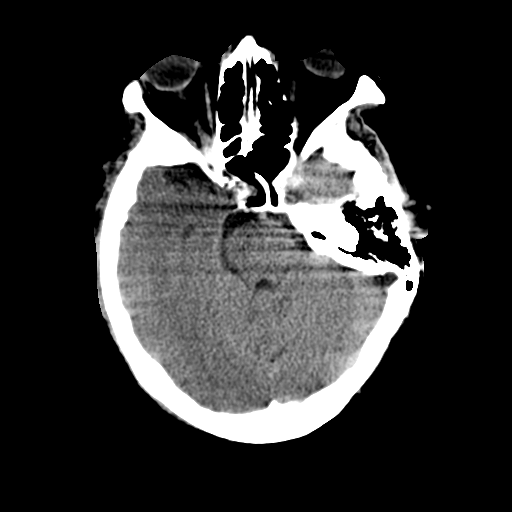
[im 12/30  brain]
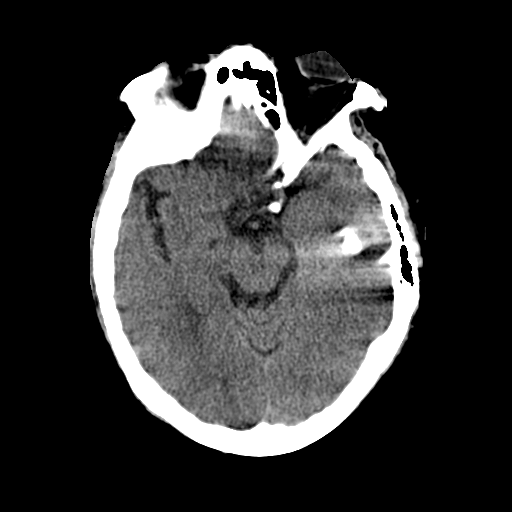
[im 13/30  brain]
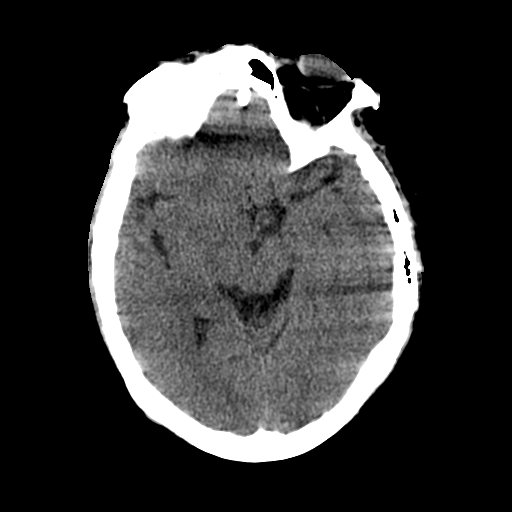
[im 17/30  brain]
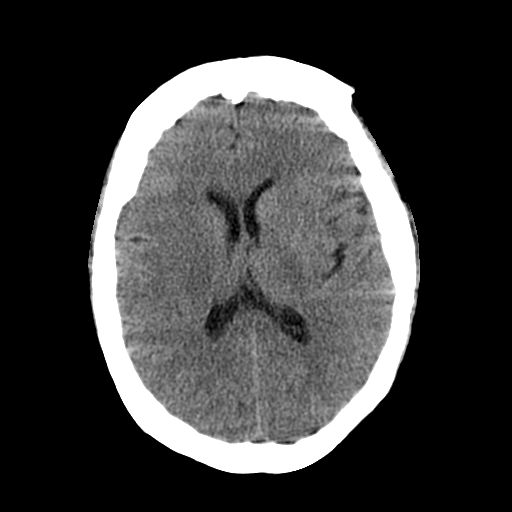
[im 17/30  bone]
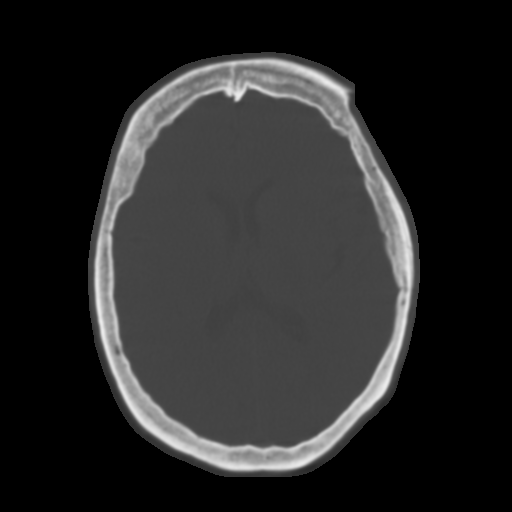
[im 18/30  brain]
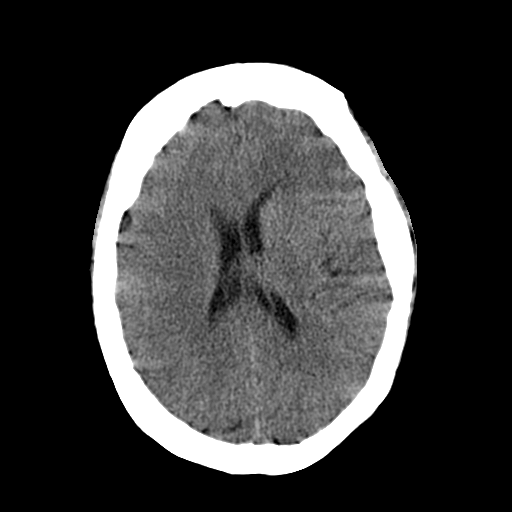
[im 20/30  brain]
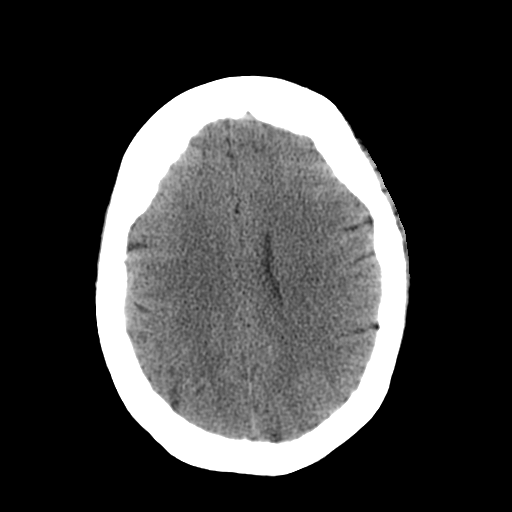
[im 21/30  brain]
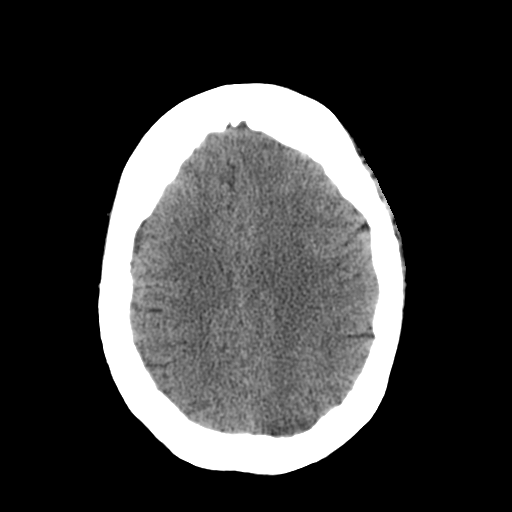
[im 23/30  brain]
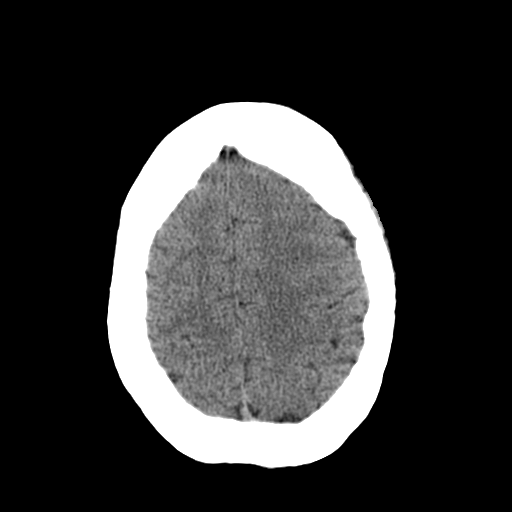
[im 23/30  bone]
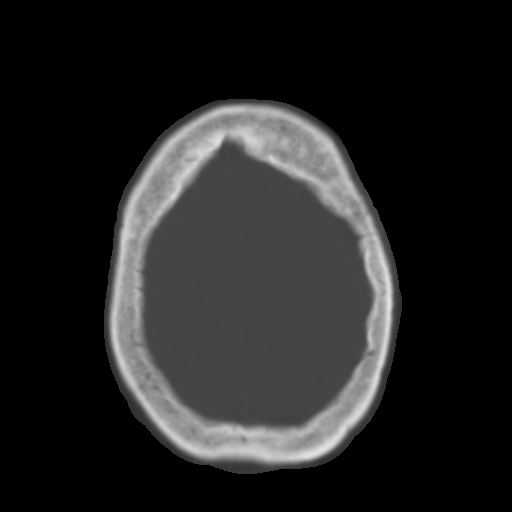
[im 25/30  brain]
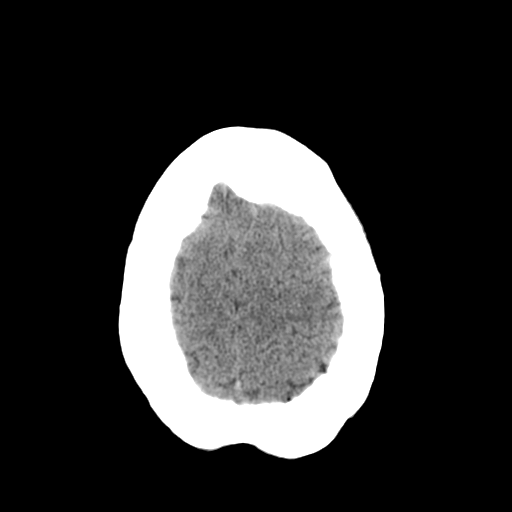
[im 26/30  brain]
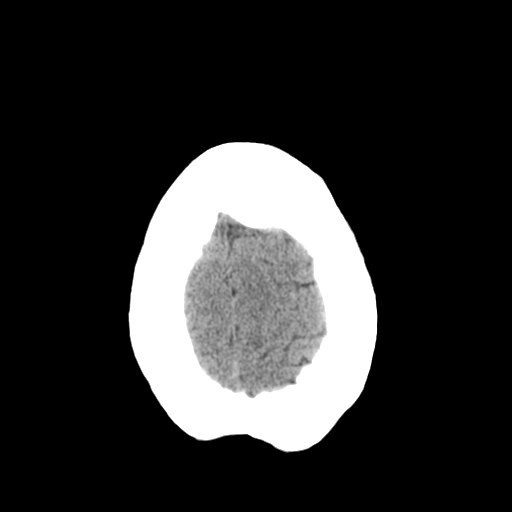
[im 28/30  brain]
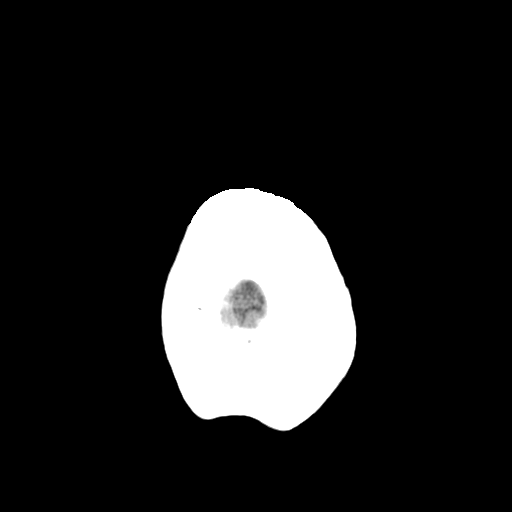

[16 of 30 positions shown; findings below may reference images not displayed]

FINDINGS: The ventricles are in the midline without mass effect or
shift.  They are normal in size and configuration.  No extra-axial
fluid collections are seen.  No CT findings for acute intracranial
process such as hemispheric infarction or intracranial hemorrhage.
Brainstem and cerebellum grossly normal and stable.

The bony calvarium is intact.  The visualized paranasal sinuses
mastoid air cells are clear.  Globes are intact.
IMPRESSION: No acute intracranial findings

## 2010-01-31 ENCOUNTER — Telehealth: Payer: Self-pay | Admitting: Family Medicine

## 2010-02-04 ENCOUNTER — Encounter: Admission: RE | Admit: 2010-02-04 | Discharge: 2010-02-04 | Payer: Self-pay | Admitting: Family Medicine

## 2010-02-07 IMAGING — CT CT ANGIO CHEST
1 of 6 series · 5 of 36 positions shown · IV contrast (Omnipaque 300)
Comparison: Chest x-ray 07/14/2008.

CLINICAL DATA: Chest pain.  Cough with productive sputum.

CT ANGIOGRAPHY CHEST WITH CONTRAST
TECHNIQUE: Multidetector CT imaging of the chest was performed
using the standard protocol during bolus administration of
intravenous contrast. Multiplanar CT image reconstructions
including MIPs were obtained to evaluate the vascular anatomy.
Contrast: 80 ml Omnipaque 350

[Series 4: pe 3.0 b40f · axial · 0.63mm/px · z∈[-280,-78]mm · 5 of 101 slices shown]
[im 17/101  lung]
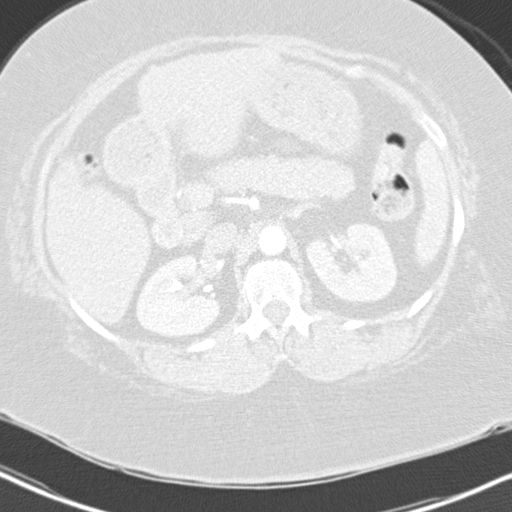
[im 34/101  mediastinal]
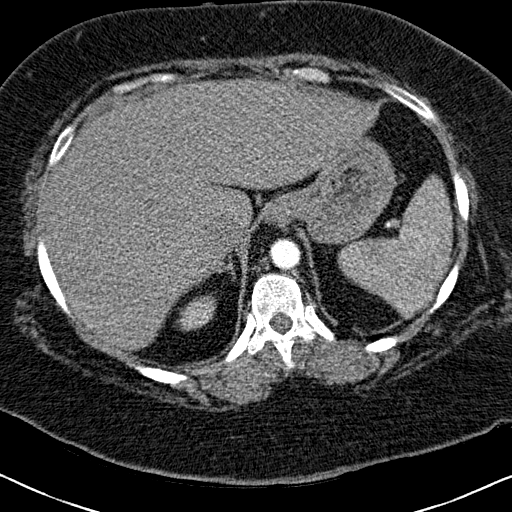
[im 51/101  lung]
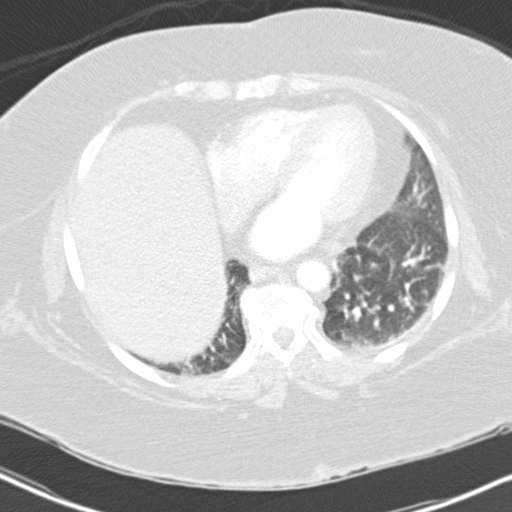
[im 67/101  mediastinal]
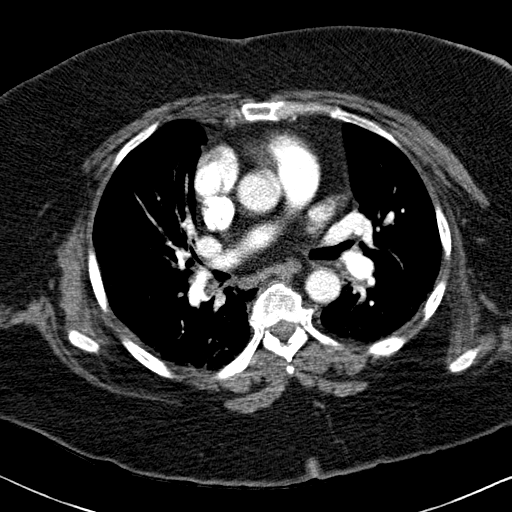
[im 84/101  lung]
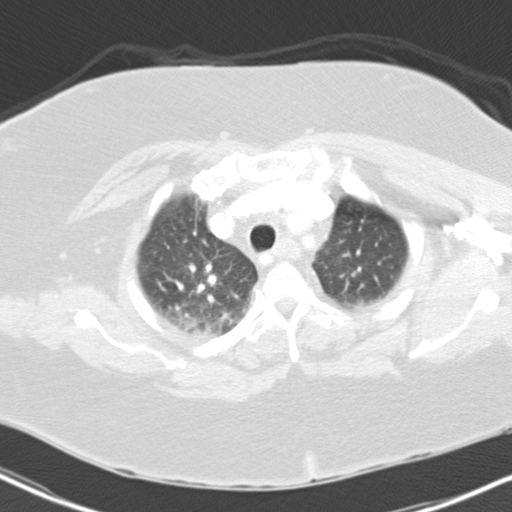

[5 of 36 positions shown; findings below may reference images not displayed]

FINDINGS: There is excellent path patient of the pulmonary
arterial system.  No focal filling defect is present to suggest
pulmonary embolus.  Heart size is normal.  There is no significant
mediastinal or axillary adenopathy.  The limited imaging of the
upper abdomen demonstrates the patient is status post
cholecystectomy.

The there is a patchy right upper lobe airspace disease, worrisome
for pneumonia.  There is a dependent volume loss are present in
both lungs, right worse than left.  There is ill-defined airspace
disease in the posterior lingula, also worrisome for pneumonia.

Bone windows demonstrate no focal lytic or blastic lesions.

Review of the MIP images confirms the above findings.
IMPRESSION: 1.  No focal filling defect to suggest pulmonary embolus.
2.  Multifocal ill-defined airspace disease, worrisome for
pneumonia.
3.  Dependent volume loss compatible with atelectasis.

## 2010-02-08 IMAGING — CT CT PELVIS W/ CM
2 of 5 series · 16 of 46 positions shown, 18 images · IV contrast (Omnipaque 300)
Comparison: CT abdomen and pelvis 08/26/2007.  CTA chest
07/15/2008.

CT ABDOMEN

CLINICAL DATA: Chest pain.  Epigastric pain.

CT ABDOMEN AND PELVIS WITH CONTRAST
TECHNIQUE: Multidetector CT imaging of the abdomen and pelvis was
performed using the standard protocol following bolus
administration of intravenous contrast.
Contrast: 1 ml Omnipaque 300.

[Series 2: abd_pel_with 5.0 b40f · axial · 0.83mm/px · z∈[-406,-10]mm · 13 of 89 slices shown, 15 images]
[im 5/89  soft-tissue]
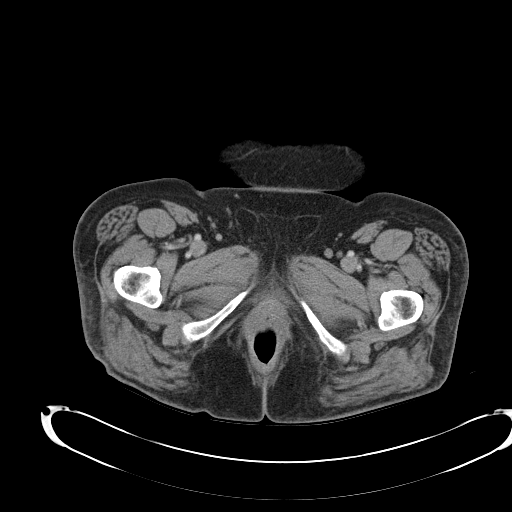
[im 5/89  bone]
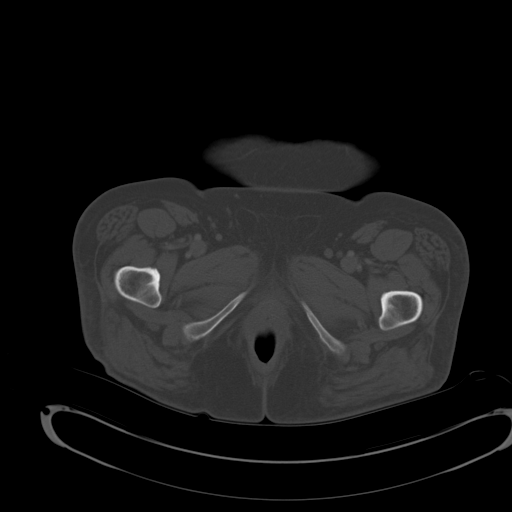
[im 10/89  soft-tissue]
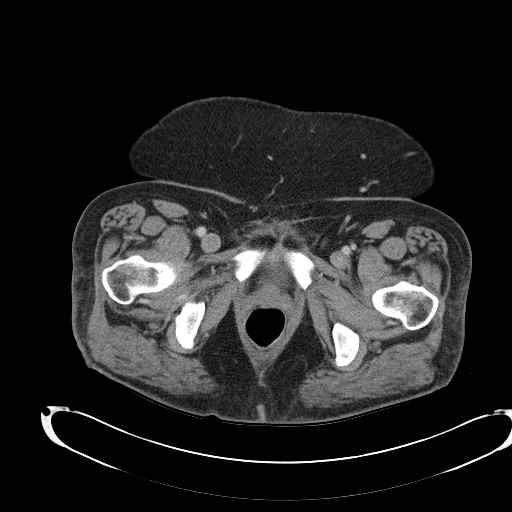
[im 20/89  soft-tissue]
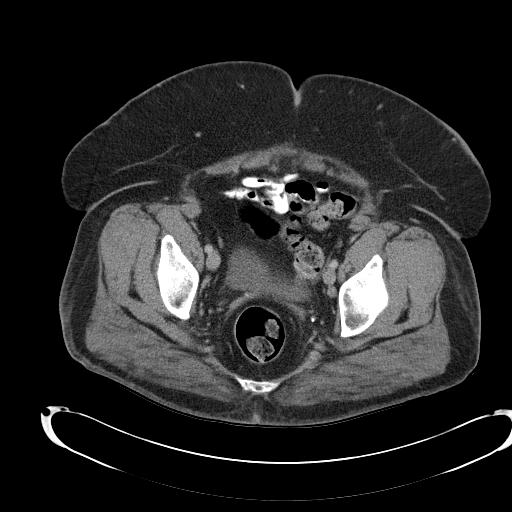
[im 25/89  soft-tissue]
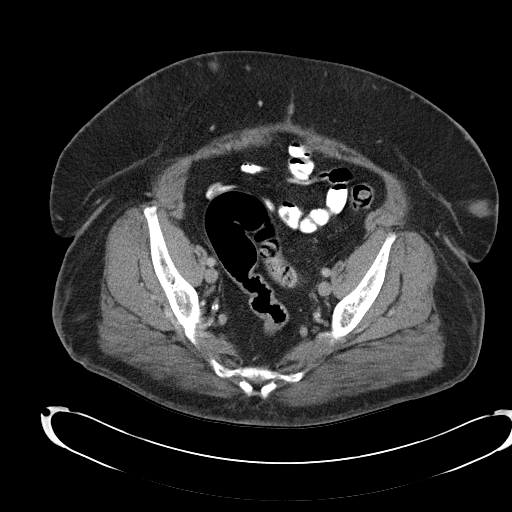
[im 30/89  soft-tissue]
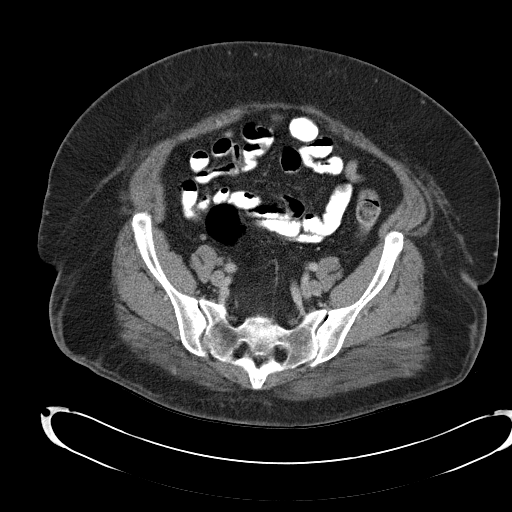
[im 40/89  soft-tissue]
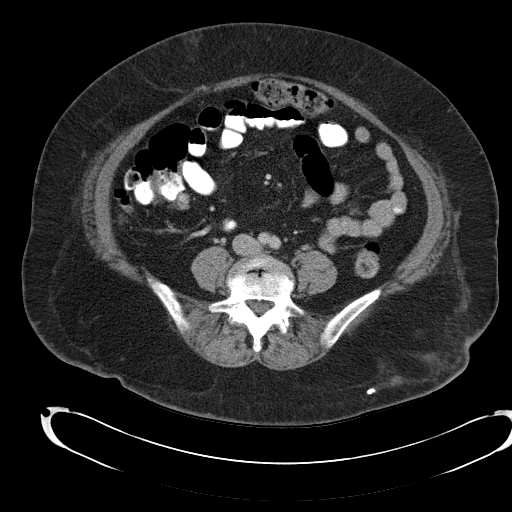
[im 45/89  soft-tissue]
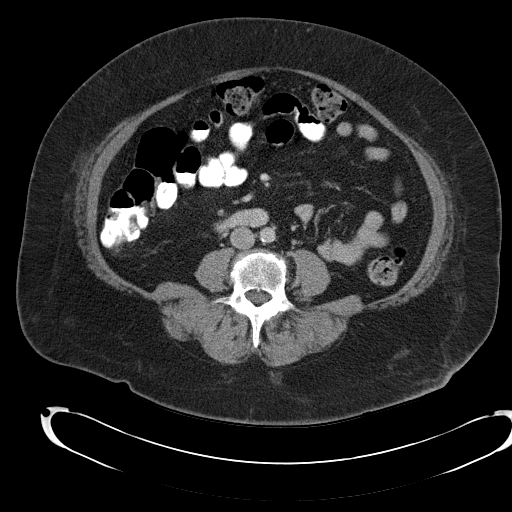
[im 49/89  soft-tissue]
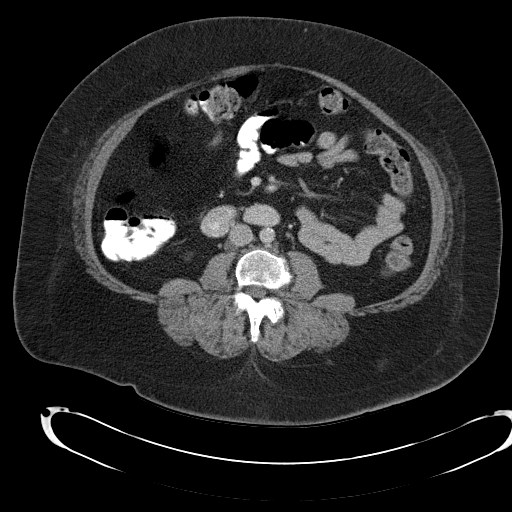
[im 59/89  soft-tissue]
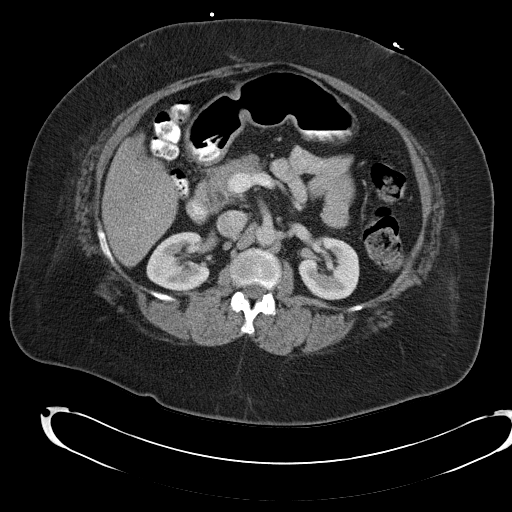
[im 59/89  bone]
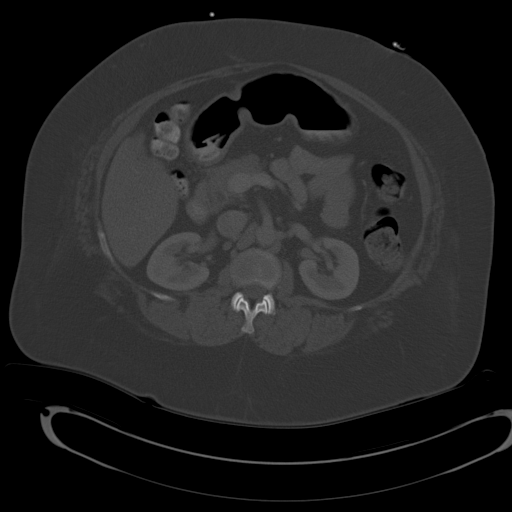
[im 64/89  soft-tissue]
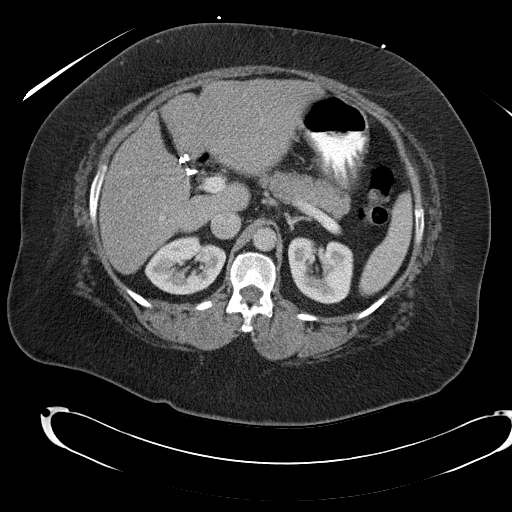
[im 69/89  soft-tissue]
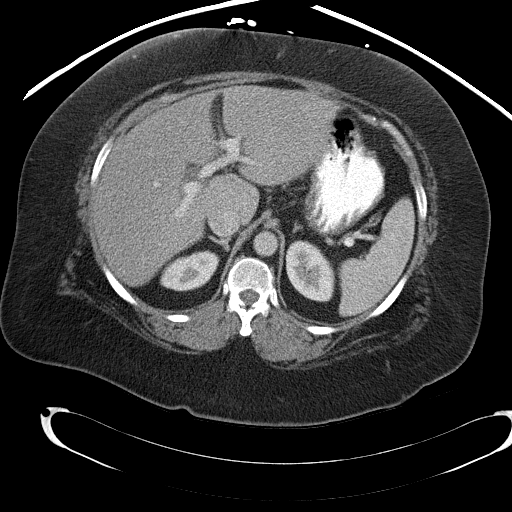
[im 79/89  soft-tissue]
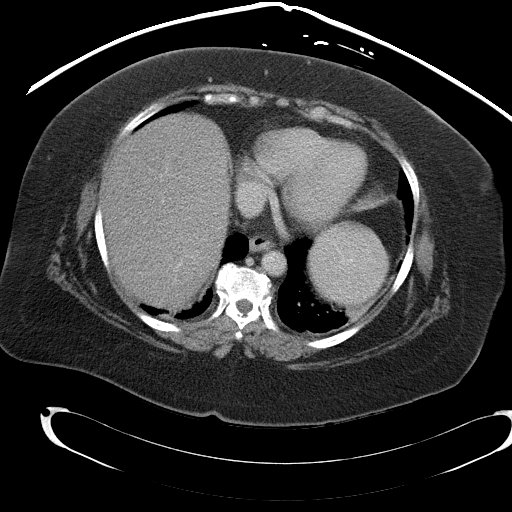
[im 84/89  soft-tissue]
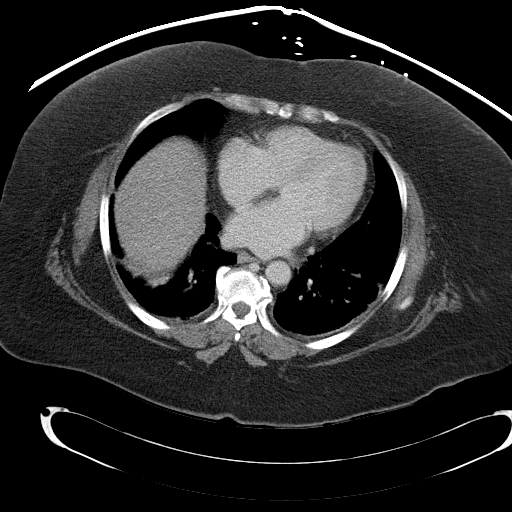

[Series 4: mpr cor post contrast (id) · coronal · 0.70mm/px · 3 of 92 slices shown]
[im 31/92  soft-tissue]
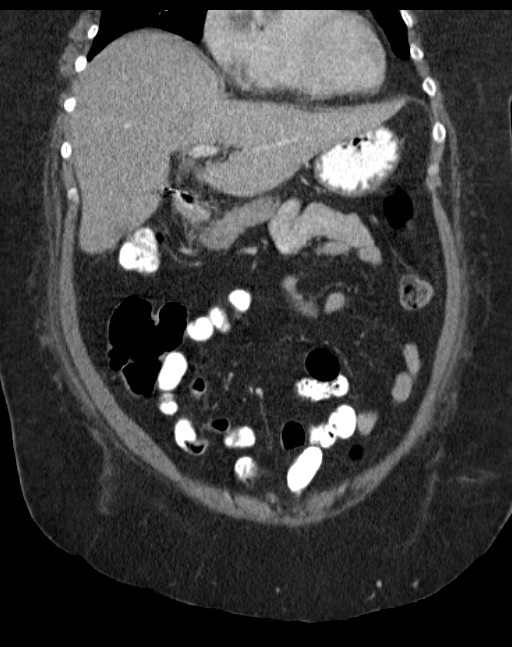
[im 41/92  soft-tissue]
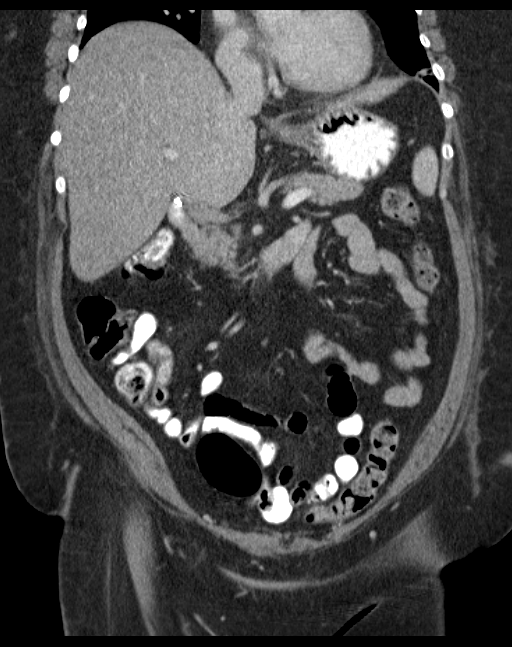
[im 51/92  soft-tissue]
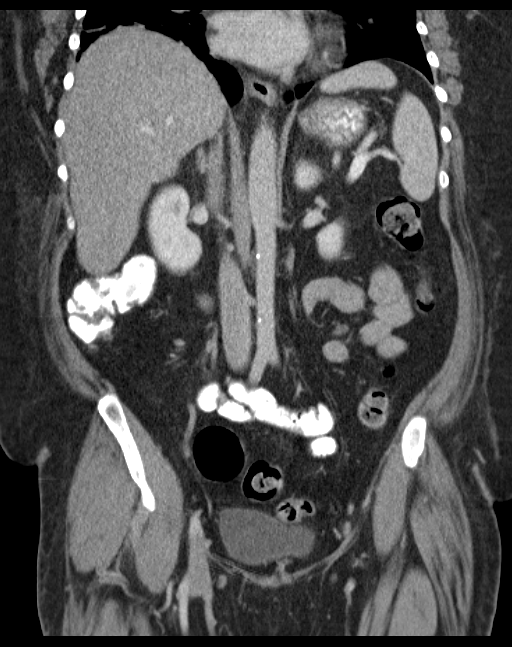

[16 of 46 positions shown; findings below may reference images not displayed]

FINDINGS: The lung bases demonstrate bilateral dependent
atelectasis, unchanged from yesterday's exam.  The heart is mildly
enlarged.  There is no significant pleural or pericardial effusion.

There is probable diffuse fatty infiltration of the liver.  No
focal lesions are evident.  The spleen is unremarkable.  The
stomach, duodenum, and pancreas are normal.  The patient is status
post cholecystectomy.  The common bile duct is within normal
limits.  The adrenal glands and kidneys are normal bilaterally.
Minimal atherosclerotic changes noted in the aorta without
aneurysm. Degenerative changes are noted L4-5 and L5-S1.  Bone
windows are otherwise unremarkable.
IMPRESSION: 1.  Bilateral lower lobe airspace disease likely reflects
atelectasis.
2.  Status post cholecystectomy.
3.  Probable diffuse fatty infiltration of the liver.
4.  No acute abnormality of the abdomen.

CT PELVIS
FINDINGS: The rectosigmoid colon demonstrates minimal by that
these are changed.  No focal inflammation is present to suggest
diverticulitis.  The remainder of the colon is normal.  The
appendix is visualized and within normal limits.  The terminal
ileum is unremarkable.  The patient is status post hysterectomy.
The ovaries are not identified.  Urinary bladder is within normal
limits. Minimal degenerative change is noted at the SI joints
bilaterally.  Bone windows are otherwise unremarkable.
IMPRESSION: 1.  No acute abnormality of the pelvis.
2.  Status post hysterectomy.
3.  Minimal diverticular change without focal inflammation.

## 2010-02-10 ENCOUNTER — Ambulatory Visit: Payer: Self-pay | Admitting: Otolaryngology

## 2010-02-10 ENCOUNTER — Ambulatory Visit (HOSPITAL_COMMUNITY)
Admission: RE | Admit: 2010-02-10 | Discharge: 2010-02-10 | Payer: Self-pay | Source: Home / Self Care | Admitting: Otolaryngology

## 2010-02-10 ENCOUNTER — Telehealth: Payer: Self-pay | Admitting: Family Medicine

## 2010-02-17 ENCOUNTER — Ambulatory Visit: Payer: Self-pay | Admitting: Otolaryngology

## 2010-02-18 ENCOUNTER — Encounter: Payer: Self-pay | Admitting: Gastroenterology

## 2010-02-21 ENCOUNTER — Encounter
Admission: RE | Admit: 2010-02-21 | Discharge: 2010-02-21 | Payer: Self-pay | Source: Home / Self Care | Attending: Family Medicine | Admitting: Family Medicine

## 2010-02-26 IMAGING — CR DG KNEE COMPLETE 4+V*R*
4 series · 4 of 4 positions shown · non-contrast
Comparison: Right tib-fib radiographs 05/21/2008

CLINICAL DATA: Unsteady gait, memory loss, right anterior knee pain

RIGHT KNEE - COMPLETE 4+ VIEW

[view not recorded (1 of 4)]
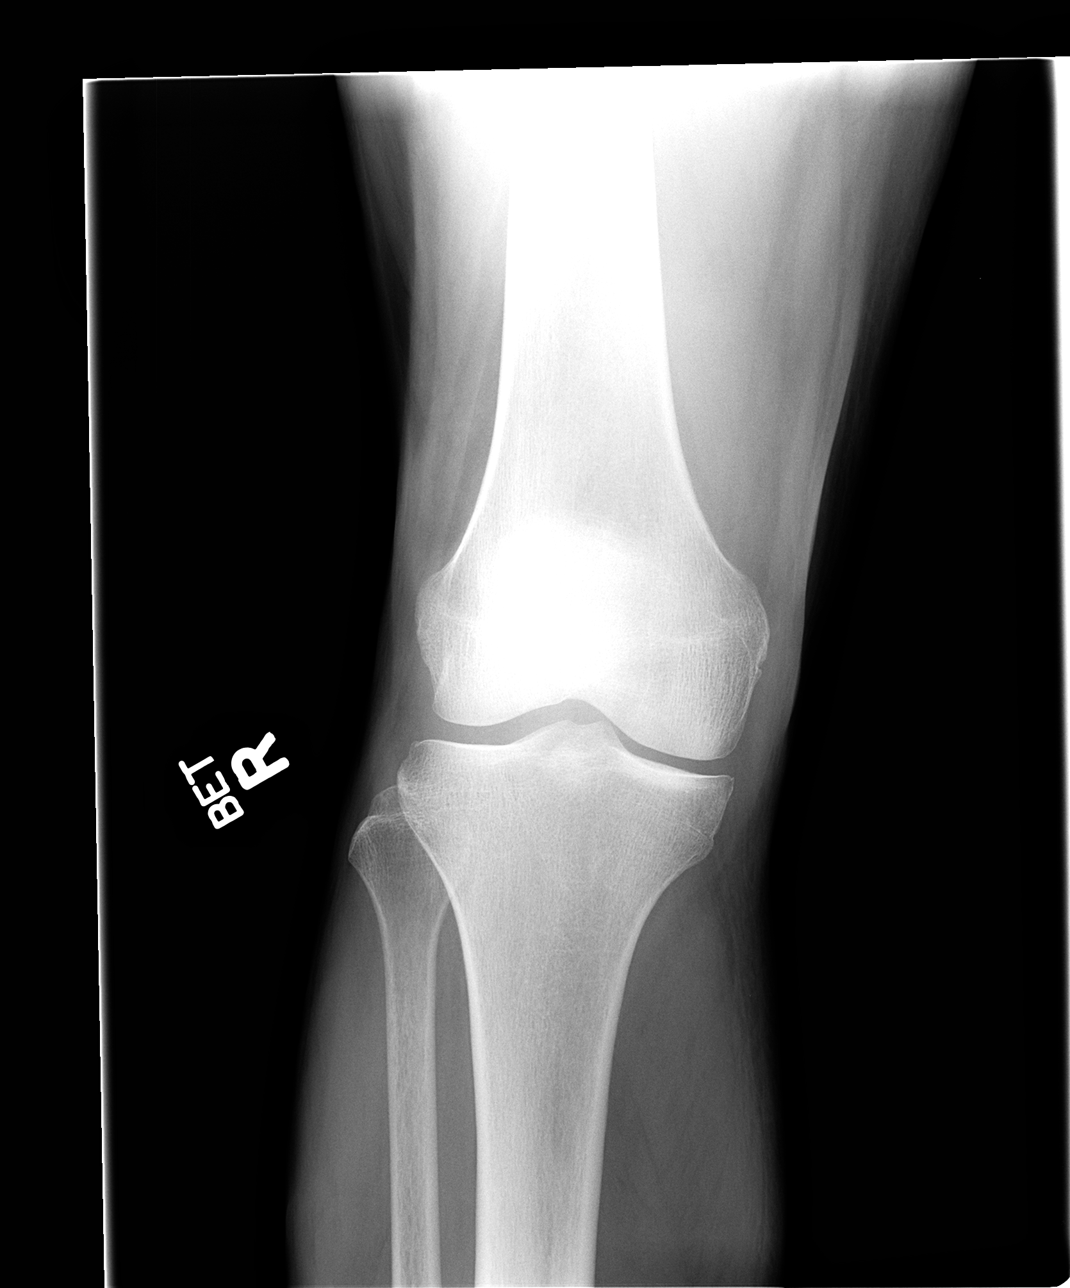

[view not recorded (2 of 4)]
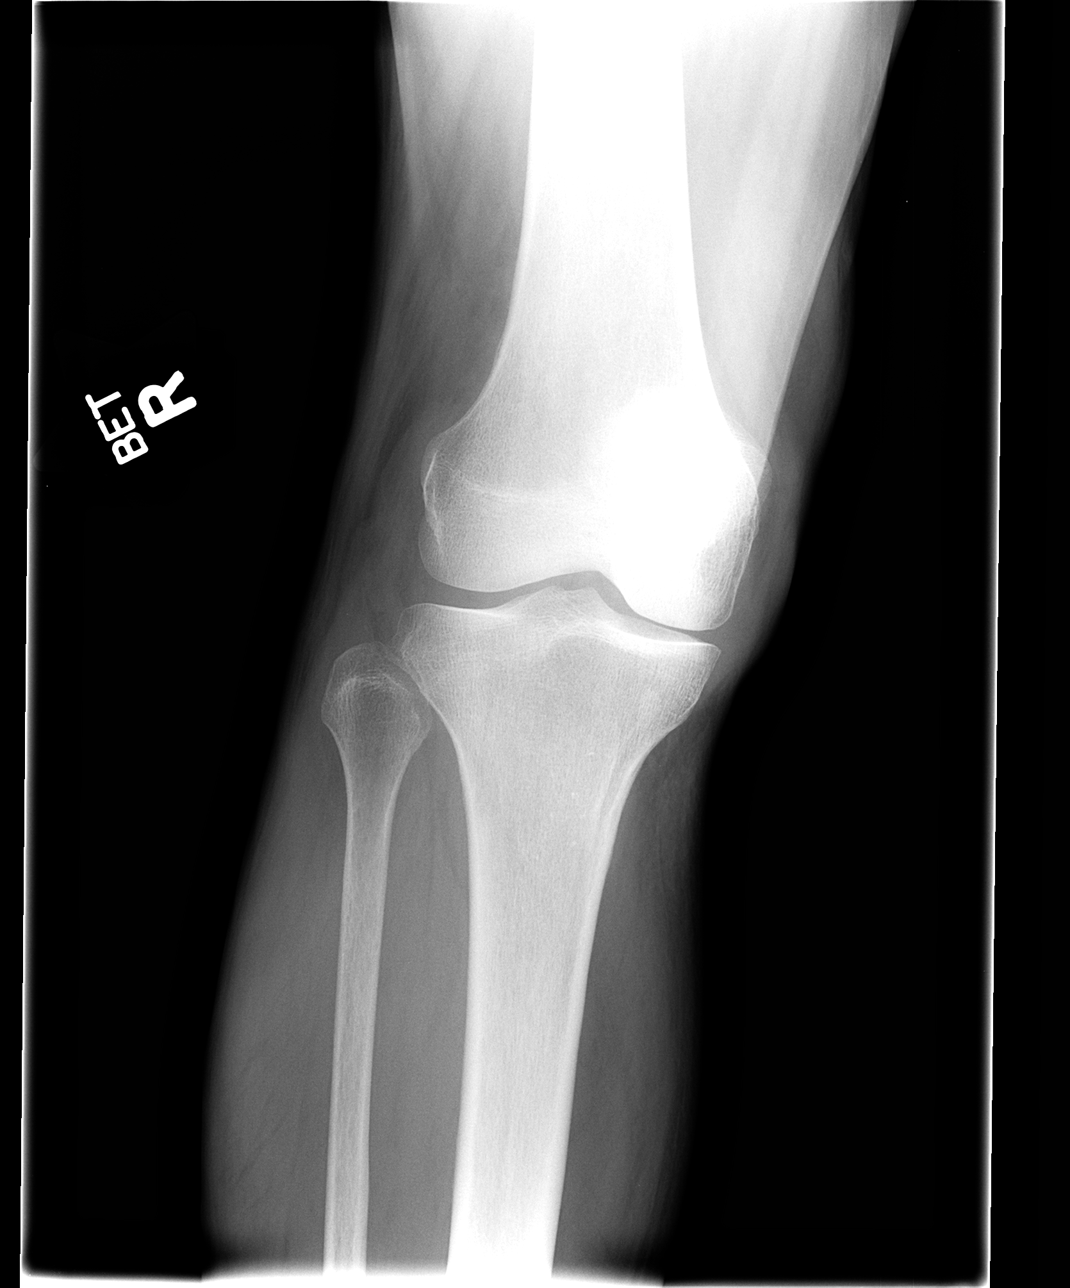

[view not recorded (3 of 4)]
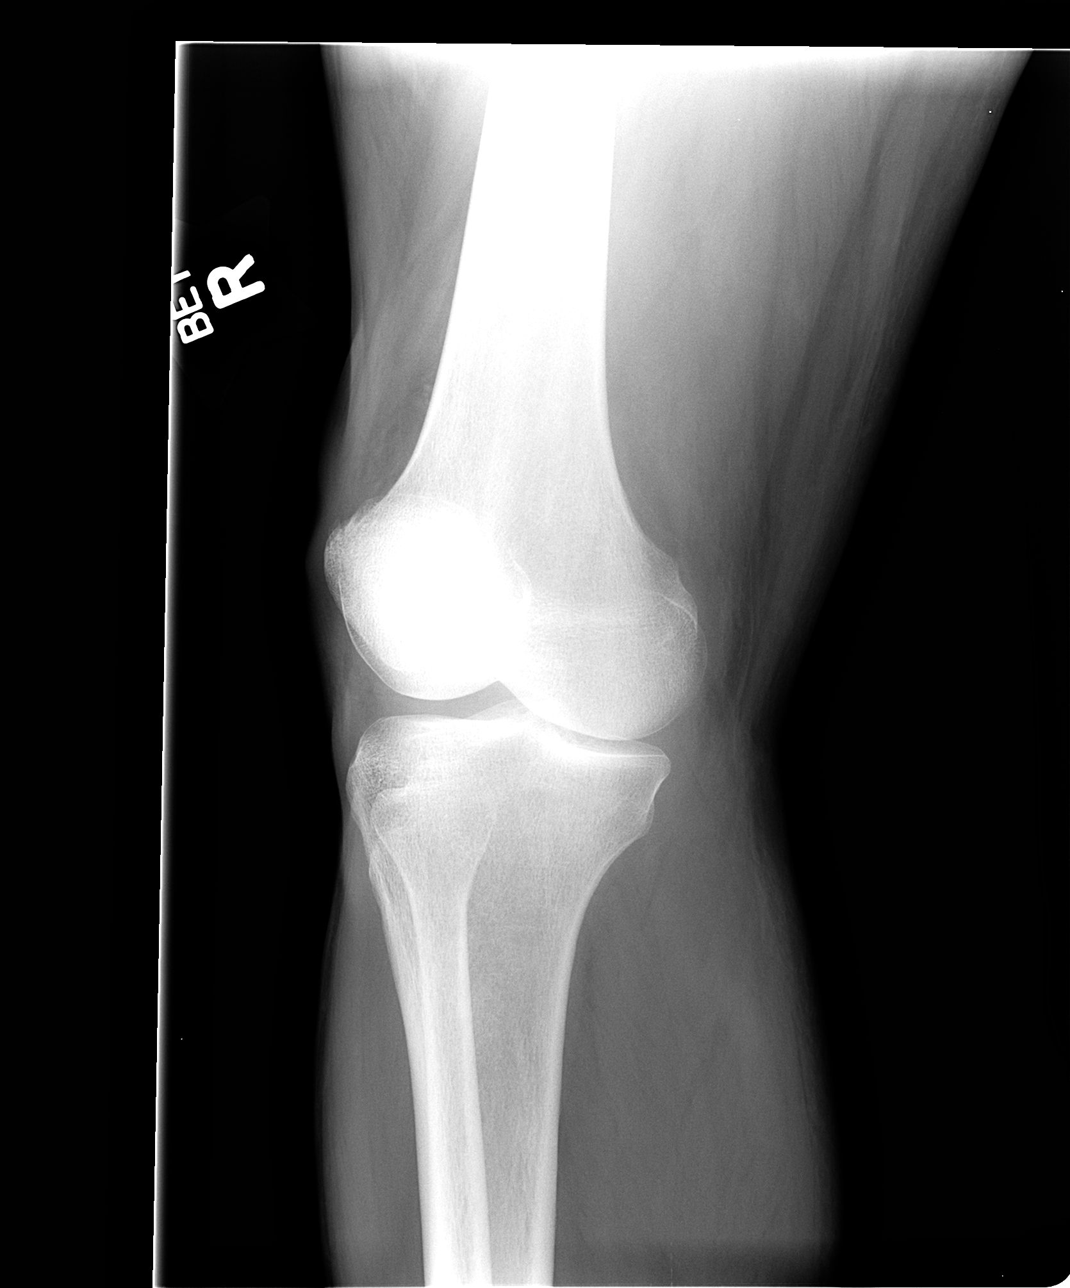

[view not recorded (4 of 4)]
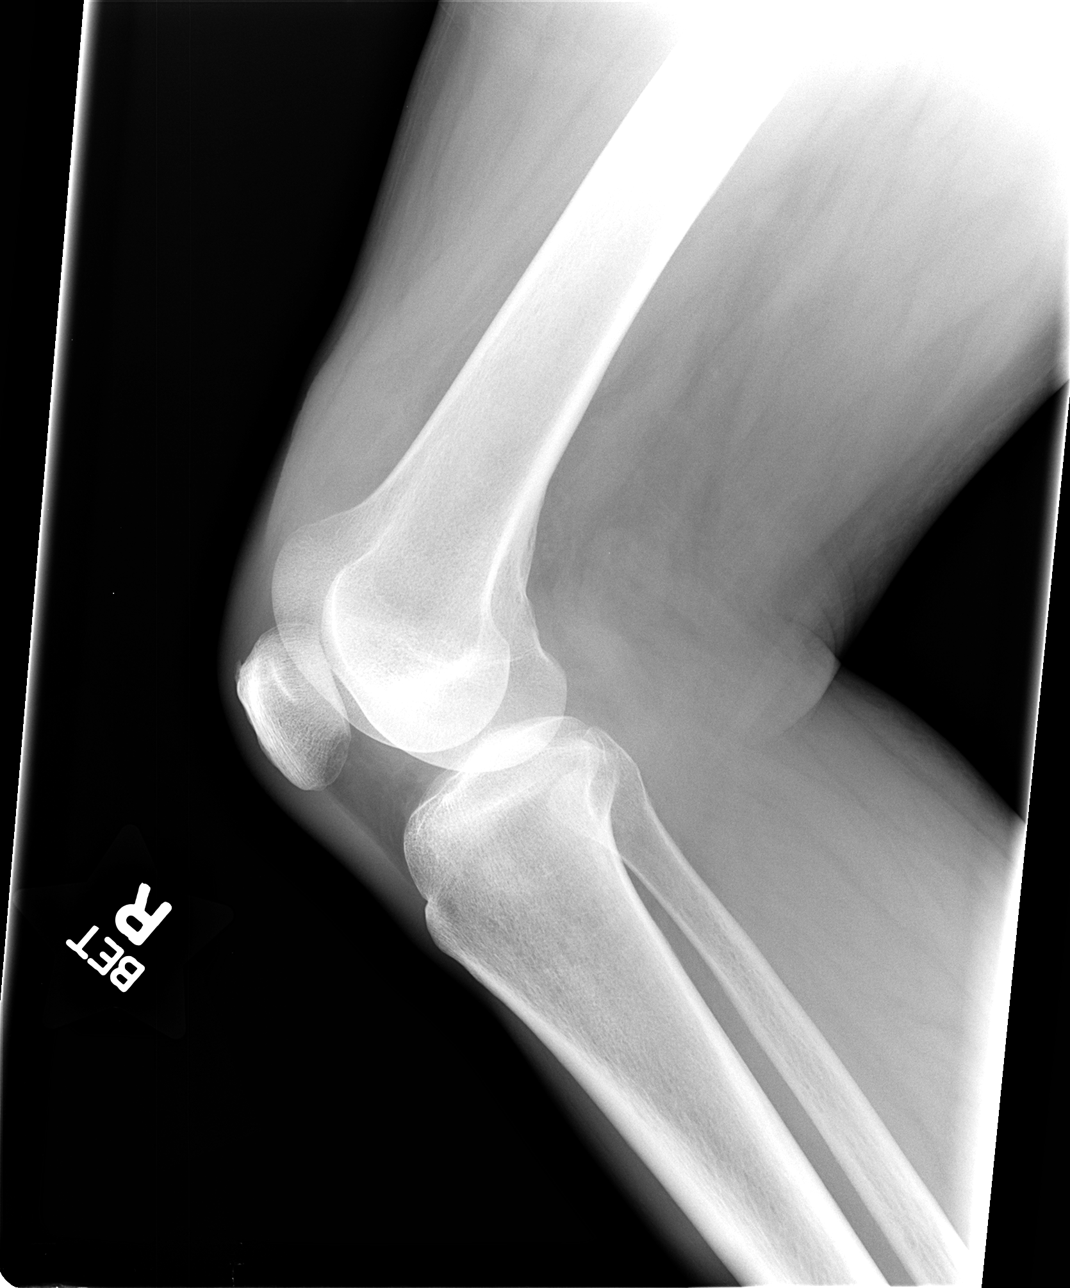

[4 of 4 positions shown; findings below may reference images not displayed]

FINDINGS: Minimal medial compartment joint space narrowing.
Bone mineralization normal.
Joint spaces otherwise preserved.
No acute fracture, dislocation, or bone destruction.
Obliquity on lateral view, no gross knee joint effusion identified.
IMPRESSION: Minimal medial compartment joint space narrowing without acute bony
abnormality.

## 2010-02-26 IMAGING — CT CT HEAD W/O CM
1 series · 16 of 30 positions shown, 20 images · non-contrast
Comparison: 07/05/2008

CLINICAL DATA: Unsteady gait.  Memory loss.  Headache.  Weakness.

CT HEAD WITHOUT CONTRAST
TECHNIQUE: Contiguous axial images were obtained from the base of
the skull through the vertex without contrast.

[Series 2: headseq 4.8 h37s · axial · 0.48mm/px · z∈[+110,+273]mm · 16 of 36 slices shown, 20 images]
[im 2/36  brain]
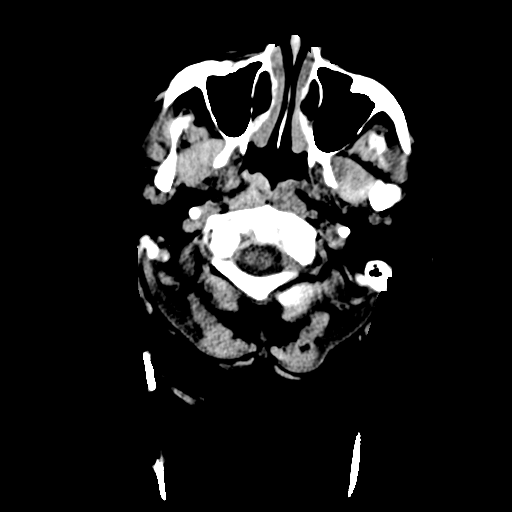
[im 2/36  bone]
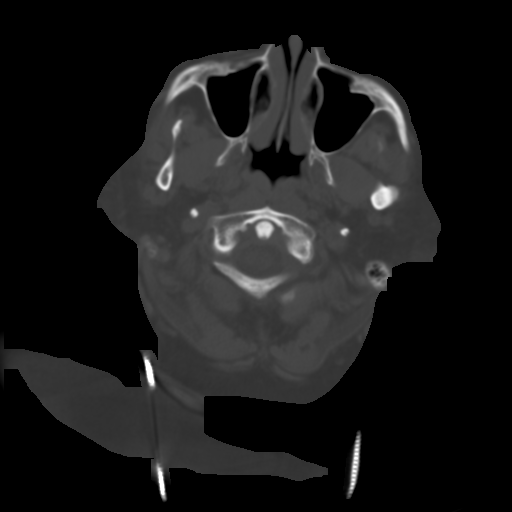
[im 4/36  brain]
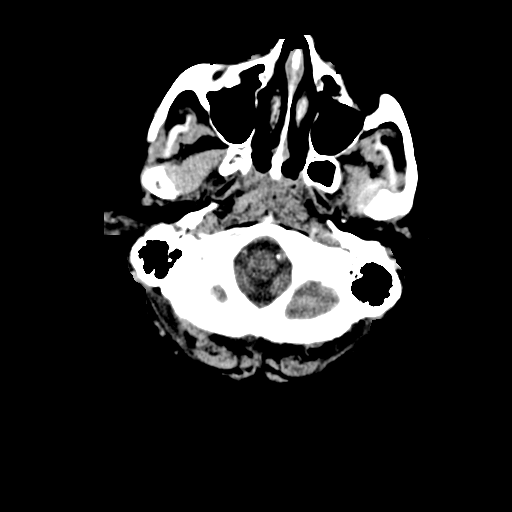
[im 7/36  brain]
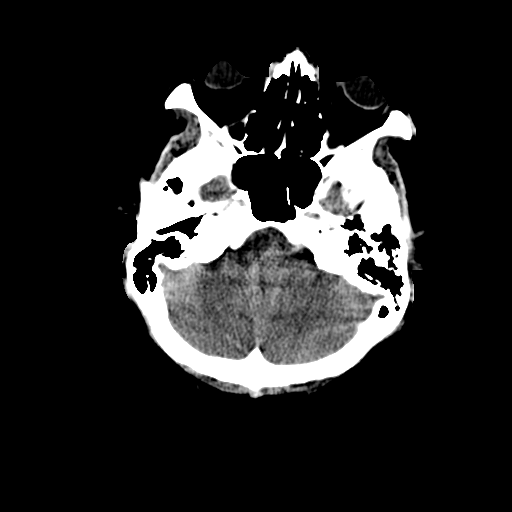
[im 9/36  brain]
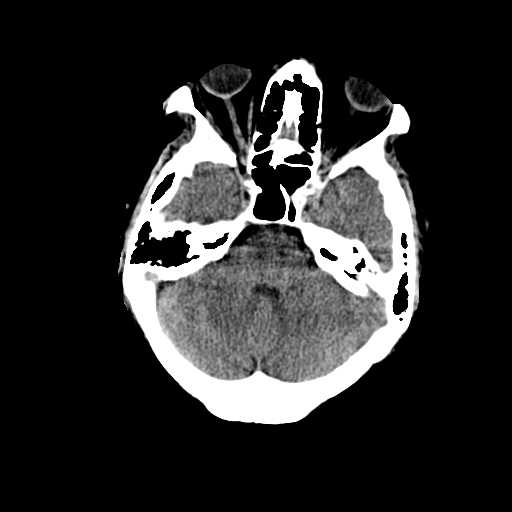
[im 10/36  brain]
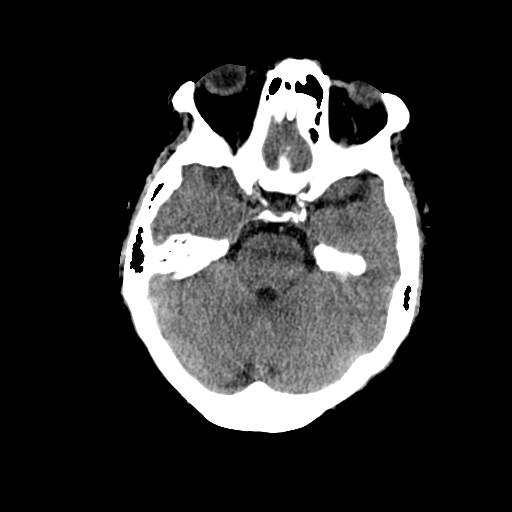
[im 10/36  bone]
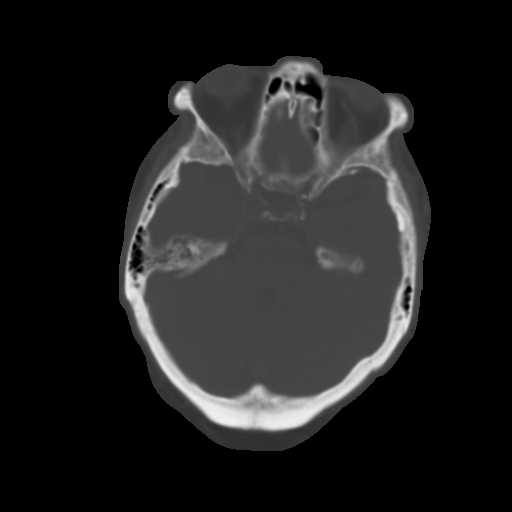
[im 13/36  brain]
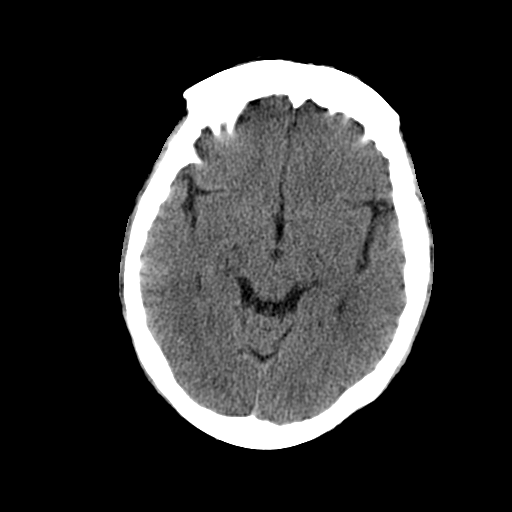
[im 15/36  brain]
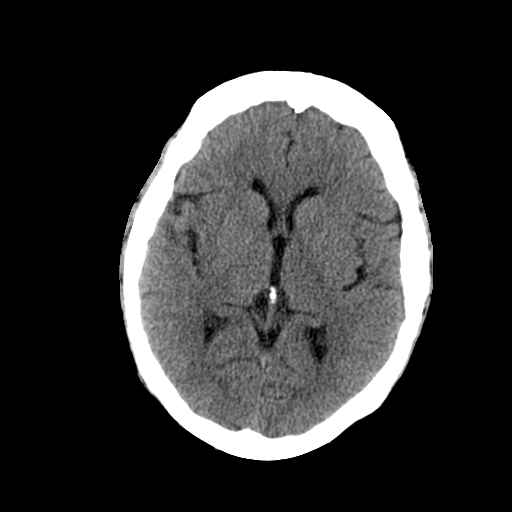
[im 17/36  brain]
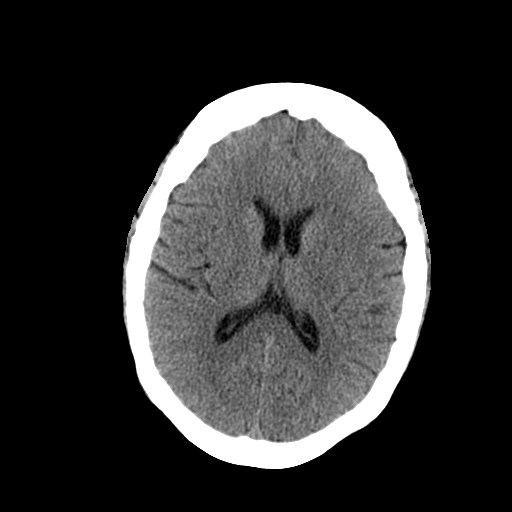
[im 19/36  brain]
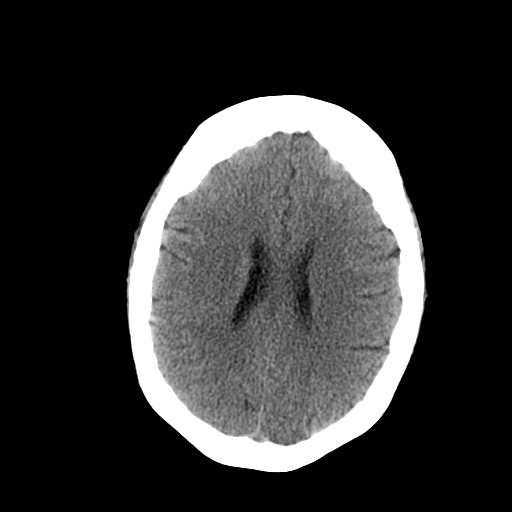
[im 19/36  bone]
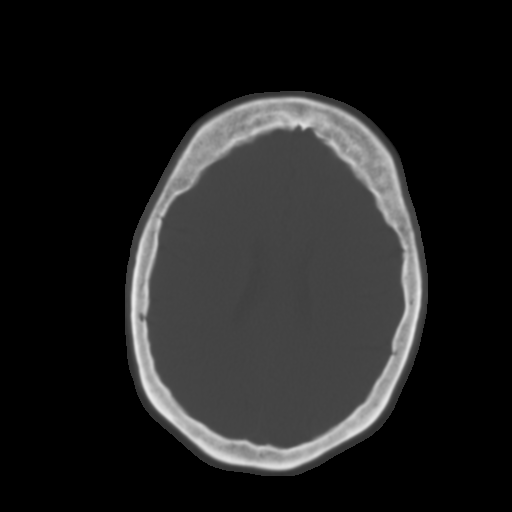
[im 21/36  brain]
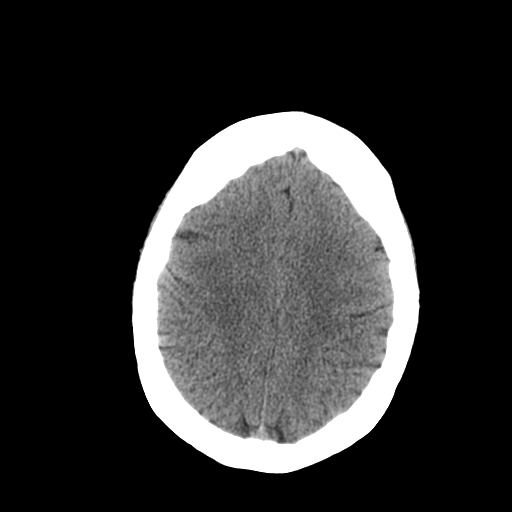
[im 23/36  brain]
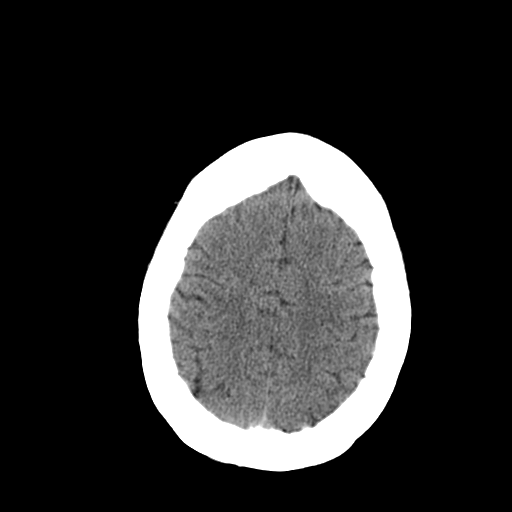
[im 26/36  brain]
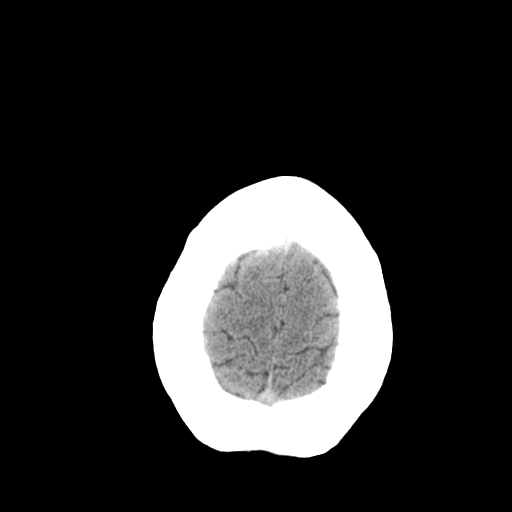
[im 27/36  brain]
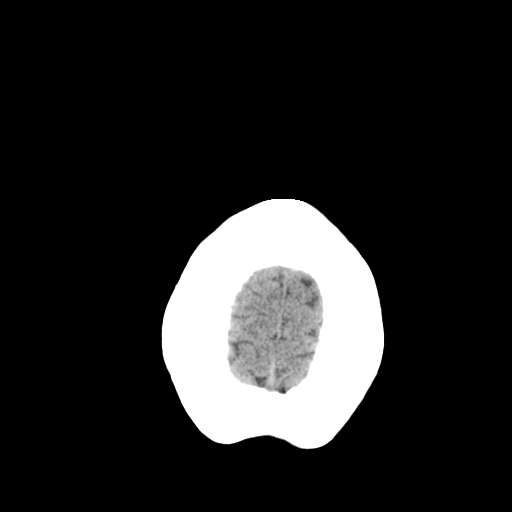
[im 27/36  bone]
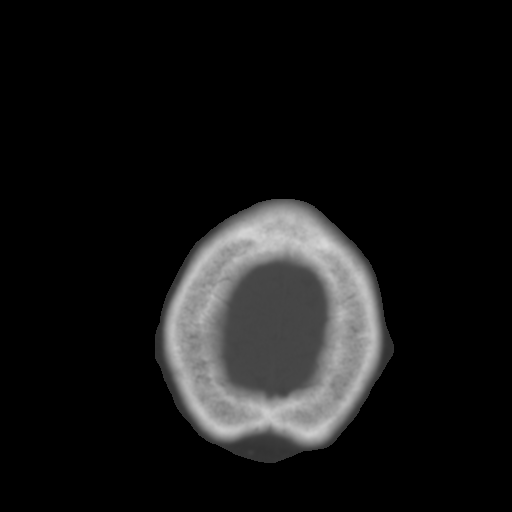
[im 29/36  brain]
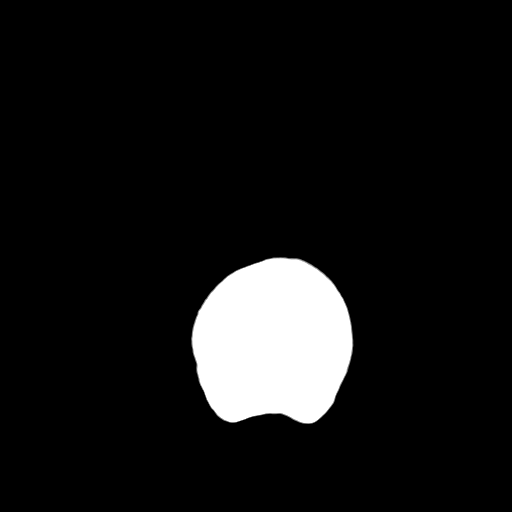
[im 32/36  brain]
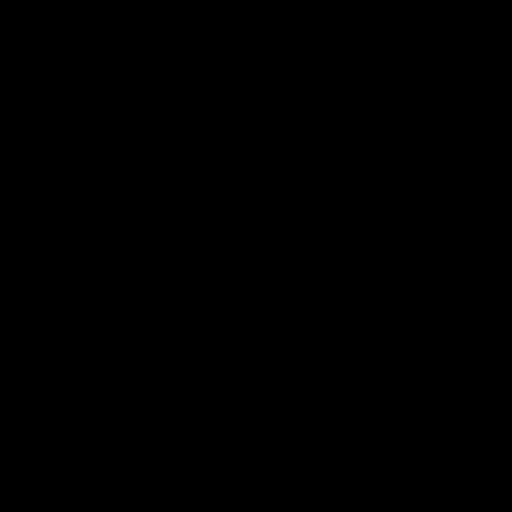
[im 34/36  brain]
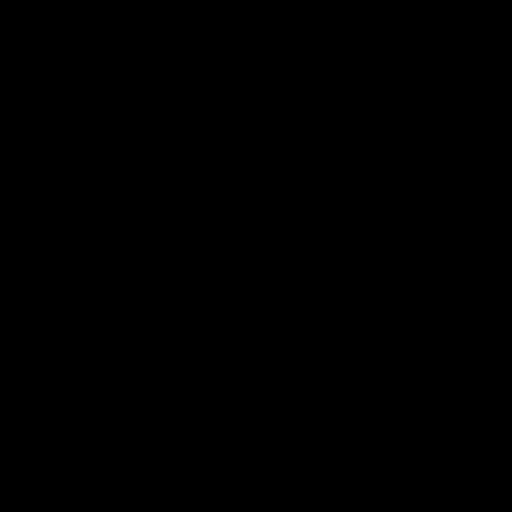

[16 of 30 positions shown; findings below may reference images not displayed]

FINDINGS: The brain does not show accelerated atrophy. There is
chronic low density in the pons consistent with microvascular
disease or chronic pontine myelinolysis. There is no sign of focal
large or small vessel infarction affecting the cerebral
hemispheres.  No mass lesion, hemorrhage, hydrocephalus or extra-
axial collection.  The calvarium is unremarkable.  The sinuses,
middle ears and mastoids are clear.
IMPRESSION: Chronic low density in the pons related to small vessel disease or
chronic pontine myelinolysis.  No other focal finding.  No acute
finding.,

## 2010-03-10 ENCOUNTER — Ambulatory Visit
Admission: RE | Admit: 2010-03-10 | Discharge: 2010-03-10 | Payer: Self-pay | Source: Home / Self Care | Attending: Family Medicine | Admitting: Family Medicine

## 2010-03-10 ENCOUNTER — Encounter: Payer: Self-pay | Admitting: Family Medicine

## 2010-03-10 ENCOUNTER — Ambulatory Visit
Admission: RE | Admit: 2010-03-10 | Discharge: 2010-03-10 | Payer: Self-pay | Source: Home / Self Care | Attending: Otolaryngology | Admitting: Otolaryngology

## 2010-03-17 ENCOUNTER — Encounter: Payer: Self-pay | Admitting: Family Medicine

## 2010-03-17 ENCOUNTER — Telehealth: Payer: Self-pay | Admitting: Family Medicine

## 2010-03-25 ENCOUNTER — Telehealth: Payer: Self-pay | Admitting: Family Medicine

## 2010-03-29 ENCOUNTER — Ambulatory Visit
Admission: RE | Admit: 2010-03-29 | Discharge: 2010-03-29 | Payer: Self-pay | Source: Home / Self Care | Attending: Family Medicine | Admitting: Family Medicine

## 2010-03-29 ENCOUNTER — Ambulatory Visit (HOSPITAL_COMMUNITY)
Admission: RE | Admit: 2010-03-29 | Discharge: 2010-03-29 | Payer: Self-pay | Source: Home / Self Care | Attending: Family Medicine | Admitting: Family Medicine

## 2010-03-29 ENCOUNTER — Encounter (INDEPENDENT_AMBULATORY_CARE_PROVIDER_SITE_OTHER): Payer: Self-pay | Admitting: *Deleted

## 2010-03-29 ENCOUNTER — Encounter: Payer: Self-pay | Admitting: Family Medicine

## 2010-03-29 DIAGNOSIS — R1011 Right upper quadrant pain: Secondary | ICD-10-CM | POA: Insufficient documentation

## 2010-03-29 DIAGNOSIS — R079 Chest pain, unspecified: Secondary | ICD-10-CM | POA: Insufficient documentation

## 2010-03-29 DIAGNOSIS — R109 Unspecified abdominal pain: Secondary | ICD-10-CM | POA: Insufficient documentation

## 2010-03-29 LAB — CONVERTED CEMR LAB
ALT: 17 units/L (ref 0–35)
AST: 24 units/L (ref 0–37)
Albumin: 3.8 g/dL (ref 3.5–5.2)
Alkaline Phosphatase: 124 units/L — ABNORMAL HIGH (ref 39–117)
Amylase: 29 units/L (ref 0–105)
BUN: 8 mg/dL (ref 6–23)
Bilirubin, Direct: 0.1 mg/dL (ref 0.0–0.3)
Blood in Urine, dipstick: NEGATIVE
Chloride: 102 meq/L (ref 96–112)
Glucose, Bld: 205 mg/dL — ABNORMAL HIGH (ref 70–99)
Glucose, Urine, Semiquant: NEGATIVE
Ketones, urine, test strip: NEGATIVE
Lymphs Abs: 2.3 10*3/uL (ref 0.7–4.0)
Monocytes Relative: 6 % (ref 3–12)
Neutro Abs: 4.5 10*3/uL (ref 1.7–7.7)
Neutrophils Relative %: 61 % (ref 43–77)
Platelets: 221 10*3/uL (ref 150–400)
Potassium: 4.3 meq/L (ref 3.5–5.3)
Protein, U semiquant: NEGATIVE
RBC: 4.73 M/uL (ref 3.87–5.11)
Sodium: 137 meq/L (ref 135–145)
Specific Gravity, Urine: >=1.03
WBC Urine, dipstick: NEGATIVE
WBC: 7.3 10*3/uL (ref 4.0–10.5)
pH: 5.5

## 2010-03-30 ENCOUNTER — Encounter: Payer: Self-pay | Admitting: Family Medicine

## 2010-03-30 ENCOUNTER — Emergency Department (HOSPITAL_COMMUNITY)
Admission: EM | Admit: 2010-03-30 | Discharge: 2010-03-30 | Payer: Self-pay | Source: Home / Self Care | Admitting: Emergency Medicine

## 2010-03-31 ENCOUNTER — Ambulatory Visit
Admission: RE | Admit: 2010-03-31 | Discharge: 2010-03-31 | Payer: Self-pay | Source: Home / Self Care | Attending: Family Medicine | Admitting: Family Medicine

## 2010-03-31 LAB — CONVERTED CEMR LAB: Helicobacter Pylori Antibody-IgG: 0.4

## 2010-04-01 ENCOUNTER — Telehealth (INDEPENDENT_AMBULATORY_CARE_PROVIDER_SITE_OTHER): Payer: Self-pay | Admitting: *Deleted

## 2010-04-03 ENCOUNTER — Encounter: Payer: Self-pay | Admitting: Internal Medicine

## 2010-04-03 ENCOUNTER — Encounter: Payer: Self-pay | Admitting: Family Medicine

## 2010-04-04 ENCOUNTER — Encounter: Payer: Self-pay | Admitting: General Surgery

## 2010-04-04 ENCOUNTER — Encounter: Payer: Self-pay | Admitting: Internal Medicine

## 2010-04-04 ENCOUNTER — Telehealth: Payer: Self-pay | Admitting: Family Medicine

## 2010-04-04 ENCOUNTER — Encounter: Payer: Self-pay | Admitting: Family Medicine

## 2010-04-04 LAB — CBC
HCT: 34.3 % — ABNORMAL LOW (ref 36.0–46.0)
Hemoglobin: 10.8 g/dL — ABNORMAL LOW (ref 12.0–15.0)
MCV: 71.6 fL — ABNORMAL LOW (ref 78.0–100.0)
Platelets: 206 10*3/uL (ref 150–400)
RBC: 4.79 MIL/uL (ref 3.87–5.11)
WBC: 10.6 10*3/uL — ABNORMAL HIGH (ref 4.0–10.5)

## 2010-04-04 LAB — LACTIC ACID, PLASMA: Lactic Acid, Venous: 1.8 mmol/L (ref 0.5–2.2)

## 2010-04-04 LAB — COMPREHENSIVE METABOLIC PANEL
Albumin: 3.7 g/dL (ref 3.5–5.2)
Alkaline Phosphatase: 125 U/L — ABNORMAL HIGH (ref 39–117)
BUN: 9 mg/dL (ref 6–23)
Chloride: 105 mEq/L (ref 96–112)
Potassium: 4.1 mEq/L (ref 3.5–5.1)
Total Bilirubin: 0.5 mg/dL (ref 0.3–1.2)

## 2010-04-04 LAB — DIFFERENTIAL
Eosinophils Relative: 1 % (ref 0–5)
Lymphocytes Relative: 22 % (ref 12–46)
Monocytes Absolute: 0.5 10*3/uL (ref 0.1–1.0)
Monocytes Relative: 5 % (ref 3–12)
Neutrophils Relative %: 72 % (ref 43–77)

## 2010-04-04 LAB — URINALYSIS, ROUTINE W REFLEX MICROSCOPIC
Bilirubin Urine: NEGATIVE
Nitrite: NEGATIVE
Specific Gravity, Urine: >1.03 — ABNORMAL HIGH (ref 1.005–1.030)
pH: 6 (ref 5.0–8.0)

## 2010-04-04 LAB — POCT CARDIAC MARKERS

## 2010-04-05 ENCOUNTER — Telehealth: Payer: Self-pay | Admitting: Family Medicine

## 2010-04-05 ENCOUNTER — Ambulatory Visit (HOSPITAL_COMMUNITY)
Admission: RE | Admit: 2010-04-05 | Discharge: 2010-04-05 | Payer: Self-pay | Source: Home / Self Care | Attending: Family Medicine | Admitting: Family Medicine

## 2010-04-07 ENCOUNTER — Telehealth: Payer: Self-pay | Admitting: Family Medicine

## 2010-04-07 LAB — URINE CULTURE

## 2010-04-10 ENCOUNTER — Encounter: Payer: Self-pay | Admitting: Family Medicine

## 2010-04-11 ENCOUNTER — Encounter: Payer: Self-pay | Admitting: Family Medicine

## 2010-04-14 NOTE — Progress Notes (Signed)
Summary: X RAY  Phone Note Call from Patient   Summary of Call: SHE WANTS AN X RAY CALL HER BACK PLEASE Initial call taken by: Lind Guest,  June 14, 2009 10:30 AM  Follow-up for Phone Call        right side hurts really bad, no appts, urgent care or er Follow-up by: Adella Hare LPN,  June 14, 2009 11:39 AM

## 2010-04-14 NOTE — Progress Notes (Signed)
Summary: Arnette Schaumann rx  Phone Note From Pharmacy   Caller: [facility] Summary of Call: Anna at [facility] called and stated Rx was 35.00 and pt refused it. Tobi Bastos asked pt to call her insurance and find out what they will pay for and call us.  Initial call taken by: Hendricks Limes LPN,  March 19, 2009 4:50 PM     Appended Document: integra rx Let's try ferrous sulfate 325mg  by mouth two times a day.  Please call in to [facility] and notify pt.  Thx.  Appended Document: integra rx Rx called to [place] at [facility]. They will deliver to pt

## 2010-04-14 NOTE — Progress Notes (Signed)
Summary: SHOWER CHAIR  Phone Note Call from Patient   Summary of Call: needs ANOTHER SHOWER CHAIR  SEND ORDER TO [facility]  CALL HER BACK AT [phone] Initial call taken by: Lind Guest,  April 15, 2009 8:33 AM  Follow-up for Phone Call        Ok to [facility] shower chair.  Please contact [facility] as requested. Follow-up by: Esperanza Sheets PA,  April 15, 2009 10:43 AM  Additional Follow-up for Phone Call Additional follow up Details #1::        need written script Additional Follow-up by: Worthy Keeler LPN,  April 15, 2009 3:16 PM    Additional Follow-up for Phone Call Additional follow up Details #2::    script written let her know and fax Follow-up by: Syliva Overman MD,  April 15, 2009 5:06 PM  Additional Follow-up for Phone Call Additional follow up Details #3:: Details for Additional Follow-up Action Taken: order sent, patient aware Additional Follow-up by: Worthy Keeler LPN,  April 15, 2009 5:08 PM

## 2010-04-14 NOTE — Progress Notes (Signed)
Summary: neice wants to speak with doc  Phone Note Call from Patient   Summary of Call: neice and power of att. bernice needs to speak with dr. Lodema Hong about pt falling and being on to many meds. [phone] Initial call taken by: Rudene Anda,  May 31, 2009 8:57 AM  Follow-up for Phone Call        pls set upan ov for this next week Monday if posssible and let them both know, since this is the concern my recommendationis that she live in a family home or nursing facility, we need to discuss this together in the officepls explain Follow-up by: Syliva Overman MD,  June 03, 2009 12:26 PM  Additional Follow-up for Phone Call Additional follow up Details #1::        called bernice and left message for her to call office back Additional Follow-up by: Rudene Anda,  June 03, 2009 2:14 PM    Additional Follow-up for Phone Call Additional follow up Details #2::    bernice called back and stated that she couldn't come with Pattricia Boss to appt next week. But i did let her know that she would need to come with pt so recommendations could be discussed. And all she said was I can't come and that was really it. Told her i would let Dr. Lodema Hong know.  Follow-up by: Rudene Anda,  June 03, 2009 4:36 PM

## 2010-04-14 NOTE — Progress Notes (Signed)
Summary: referral  Phone Note Call from Patient   Summary of Call: pt wants to go to ortho in [place]. is this okay. reagen  dranas  [phone] pt would also like to oget a door bell  Initial call taken by: Rudene Anda,  December 24, 2009 10:41 AM  Follow-up for Phone Call        pls find out what she wants to see ortho for, if it's her back with thebulging discs ortho does not operate on backs, she has been to neurosurg and pain clinic about this a;lready so not sure what it's for.  Also i hcan donothing to do with her getting a doorbell, she needs to look about this herself Follow-up by: Syliva Overman MD,  December 24, 2009 3:19 PM  Additional Follow-up for Phone Call Additional follow up Details #1::        see phone note 12/28/09- follow up on ortho referal Additional Follow-up by: Adella Hare LPN,  March 22, 2010 2:16 PM

## 2010-04-14 NOTE — Progress Notes (Signed)
Summary: referral  Phone Note Call from Patient   Summary of Call: pt having trouble swollening food and also states sometimes it comes up. [phone] Initial call taken by: Rudene Anda,  Jul 27, 2009 11:21 AM  Follow-up for Phone Call        advised patient to call her GI doctor, they told her to call us Follow-up by: Adella Hare LPN,  Jul 27, 2009 11:57 AM  Additional Follow-up for Phone Call Additional follow up Details #1::        pls refer to GI for dysphagia and gerd, she goes there regularly  Additional Follow-up by: Syliva Overman MD,  Jul 27, 2009 4:57 PM    Additional Follow-up for Phone Call Additional follow up Details #2::    pt was referred back to dr. Jeanella Flattery office. they will call her with appt. pt notified  Follow-up by: Rudene Anda,  Jul 28, 2009 9:24 AM

## 2010-04-14 NOTE — Letter (Signed)
Summary: BARIUM SWALLOW STUDY  BARIUM SWALLOW STUDY   Imported By: Diana Eves 05/28/2009 10:29:42  _____________________________________________________________________  External Attachment:    Type:   Image     Comment:   External Document

## 2010-04-14 NOTE — Progress Notes (Signed)
Summary: BLOOD SUGAR  Phone Note Call from Patient   Summary of Call: SUGAR IS 261  CALL BACK AT [phone] Initial call taken by: Lind Guest,  Jul 16, 2009 9:41 AM  Follow-up for Phone Call        number is fasting and was 253 yesterday Follow-up by: Adella Hare LPN,  Jul 17, 4538 9:58 AM  Additional Follow-up for Phone Call Additional follow up Details #1::        pls advise her to inc the glipizide to 5 mg two tabs twice daily, i will erx a new dose pls stamp and fax in that will be for 10mg  twice daily, she is to finish the 5mg  tabs before filling the 10mg  Additional Follow-up by: Syliva Overman MD,  Jul 16, 2009 10:52 AM    Additional Follow-up for Phone Call Additional follow up Details #2::    Patient aware and [facility] dose [facility] Follow-up by: Everitt Amber LPN,  Jul 16, 9809 4:27 PM  New/Updated Medications: GLIPIZIDE 10 MG TABS (GLIPIZIDE) Take 1 tablet by mouth two times a day Prescriptions: GLIPIZIDE 10 MG TABS (GLIPIZIDE) Take 1 tablet by mouth two times a day  #60 x 3   Entered and Authorized by:   Syliva Overman MD   Signed by:   Syliva Overman MD on 07/16/2009   Method used:   Printed then faxed to ...       [facility] (retail)       [address]       [place], [place]  [address]       Ph: [phone]       Fax: [phone]   RxID:   [id number]

## 2010-04-14 NOTE — Letter (Signed)
Summary: demo chart 1  demo chart 1   Imported By: Lind Guest 11/12/2009 08:19:11  _____________________________________________________________________  External Attachment:    Type:   Image     Comment:   External Document

## 2010-04-14 NOTE — Letter (Signed)
Summary: Letter  Letter   Imported By: Lind Guest 11/09/2009 11:40:30  _____________________________________________________________________  External Attachment:    Type:   Image     Comment:   External Document

## 2010-04-14 NOTE — Progress Notes (Signed)
Summary: power wheel chair  Phone Note Call from Patient   Summary of Call: does not have the grip for a wheelchair wants a powerchair from [employer] they will be sending over papers Initial call taken by: Lind Guest,  September 17, 2009 2:49 PM  Follow-up for Phone Call        advised patient that you normally use [place]  wants power chair Follow-up by: Adella Hare LPN,  September 17, 1608 2:56 PM  Additional Follow-up for Phone Call Additional follow up Details #1::        pls let pt know shee will need formal eval by physical; therapy to see if she qualifies /needs power wheelchair and set up an appt with them for evaluation for this, thanks  Additional Follow-up by: Syliva Overman MD,  September 18, 2009 5:12 PM    Additional Follow-up for Phone Call Additional follow up Details #2::    referred pt over to [facility] for wheelchair evaluation Follow-up by: Rudene Anda,  September 21, 2009 4:02 PM

## 2010-04-14 NOTE — Letter (Signed)
Summary: TRIAGE ORDER  TRIAGE ORDER   Imported By: Ave Filter 04/23/2009 10:46:54  _____________________________________________________________________  External Attachment:    Type:   Image     Comment:   External Document

## 2010-04-14 NOTE — Assessment & Plan Note (Signed)
Summary: F UP   Vital Signs:  Patient profile:   60 year old female Menstrual status:  hysterectomy Height:      62 inches Weight:      174.25 pounds BMI:     31.99 O2 Sat:      97 % Pulse rate:   100 / minute Pulse rhythm:   regular Resp:     16 per minute BP sitting:   170 / 78  (left arm) Cuff size:   regular  Vitals Entered By: Everitt Amber LPN (October 26, 2009 10:35 AM)  Nutrition Counseling: Patient's BMI is greater than 25 and therefore counseled on weight management options. CC: Follow up chronic problems   Primary Care Sreekar Broyhill:  [facility]  CC:  Follow up chronic problems.  History of Present Illness: Excessively sleepy x 2 days. pt reports that hse has not been doing very well recently, she has noted increased gait instability and is excessively sleepy. She denies falling. she denies any recent fevr or chills. Her apetitie is fair, she has had no change in her bowel movemements. She has chronic back and lower extremety pain with lower ext weakness.  Current Medications (verified): 1)  Singulair 10 Mg  Tabs (Montelukast Sodium) .... One Tab By Mouth Once Daily 2)  Zoloft 100 Mg  Tabs (Sertraline Hcl) .... Two Tabs By Mouth Once Daily 3)  Evoxac 30 Mg Caps (Cevimeline Hcl) .... One Cap By Mouth Tid 4)  Mirtazapine 30 Mg Tabs (Mirtazapine) .... Take 1 Tab By Mouth At Bedtime 5)  Levoxyl 50 Mcg Tabs (Levothyroxine Sodium) .... Take 1 Tablet By Mouth Once A Day 6)  Gabapentin 300 Mg Caps (Gabapentin) .... Take 1 Capsule By Mouth Three Times A Day 7)  Metoprolol Tartrate 25 Mg Tabs (Metoprolol Tartrate) .... Take 1 Tablet By Mouth Two Times A Day 8)  Crestor 10 Mg Tabs (Rosuvastatin Calcium) .... Take 1 Tab By Mouth At Bedtime 9)  Lamictal 100 Mg Tabs (Lamotrigine) .... One By Mouth Daily 10)  Amlodipine Besy-Benazepril Hcl 5-40 Mg Caps (Amlodipine Besy-Benazepril Hcl) .... Take 1 Tablet By Mouth Once A Day 11)  Trazodone Hcl 100 Mg Tabs (Trazodone Hcl) .... Take 1 Tab By  Mouth At Bedtime 12)  Ativan 1 Mg Tabs (Lorazepam) .... Take 1 Tablet By Mouth Four Times A Day 13)  Metoclopramide Hcl 5 Mg Tabs (Metoclopramide Hcl) .... One By Mouth Qach and Qhs 14)  Flexeril 10 Mg Tabs (Cyclobenzaprine Hcl) .... Take 1 Tablet By Mouth Two Times A Day 15)  Hydrocodone-Acetaminophen 5-500 Mg Tabs (Hydrocodone-Acetaminophen) .... Take 1 Tablet By Mouth Two Times A Day 16)  Plavix 75 Mg Tabs (Clopidogrel Bisulfate) .... Once Daily 17)  Miacalcin 200 Unit/ml Soln (Calcitonin (Salmon)) .... Once Daily 18)  Lomotil 2.5-0.025 Mg Tabs (Diphenoxylate-Atropine) .... As Needed 19)  Glipizide 10 Mg Tabs (Glipizide) .... Take 1 Tablet By Mouth Two Times A Day 20)  Zofran 4 Mg Tabs (Ondansetron Hcl) .... Take 1 Tablet By Mouth Once A Day As Needed Severe Nausea 21)  Clonidine Hcl 0.2 Mg Tabs (Clonidine Hcl) .... One Tablet in The Morning and One and A Half in The Evening 22)  Onglyza 5 Mg Tabs (Saxagliptin Hcl) .... Take 1 Tablet By Mouth Once A Day 23)  Tessalon Perles 100 Mg Caps (Benzonatate) .... One Cap By Mouth Three Times A Day 24)  Flonase 50 Mcg/act Susp (Fluticasone Propionate) .... Once Daily 25)  Aciphex 20 Mg Tbec (Rabeprazole Sodium) .... Once Daily  26)  Nystop 100000 Unit/gm Powd (Nystatin) .... Two Times A Day 27)  Meclizine Hcl 12.5 Mg Tabs (Meclizine Hcl) .... As Needed 28)  Furosemide 20 Mg Tabs (Furosemide) .... Once Daily As Needed 29)  Klor-Con M20 20 Meq Cr-Tabs (Potassium Chloride Crys Cr) .... As Needed With Furosemide 30)  Vitamin D 50000u .... Q Week 31)  Doxycycline Hyclate 100 Mg Caps (Doxycycline Hyclate) .... Take 1 Two Times A Day X 10 Days  Allergies (verified): 1)  ! * Keflex 2)  ! Pcn 3)  ! Pyridium 4)  * Milk Products  Review of Systems      See HPI General:  Complains of fatigue and sleep disorder. Eyes:  Complains of vision loss-both eyes. ENT:  Complains of decreased hearing; c/o persitent left ear discomfort with hearing loss. CV:   Denies chest pain or discomfort, palpitations, and swelling of feet. Resp:  Denies cough and sputum productive. GI:  Denies abdominal pain, nausea, and vomiting. GU:  Denies dysuria and urinary frequency. MS:  Complains of joint pain, low back pain, mid back pain, muscle weakness, and stiffness. Derm:  Denies itching, lesion(s), and rash. Neuro:  Complains of falling down, memory loss, and poor balance; denies difficulty with concentration, inability to speak, seizures, and sensation of room spinning. Psych:  Complains of anxiety, depression, and mental problems; denies suicidal thoughts/plans, thoughts of violence, and unusual visions or sounds. Endo:  Denies excessive thirst and excessive urination; twice daily testing fastin 137, evening 175. Heme:  Complains of bleeding; c/o bleeding from left leg 3 nights ago, spontaneously, exam suggests pt was scratching the leg. Allergy:  Denies hives or rash and itching eyes.  Physical Exam  General:  Well-developed,overweightin no acute distress; alert,appropriate and cooperative throughout examination HEENT: No facial asymmetry,  EOMI, No sinus tenderness, TM's Clear, oropharynx  pink and moist.   Chest: Clear to auscultation bilaterally.  CVS: S1, S2, No murmurs, No S3.   Abd: Soft, Nontender.  MS: decreeased ROM spine,adequate in hips, shoulders and knees.  Ext: No edema.   CNS: CN 2-12 intact, power tone and sensation normal throughout.   Skin: Intact, no visible lesions or rashes.  Psych: Good eye contact, flat  affect.  Memory loss  anxious but not  depressed appearing.    Impression & Recommendations:  Problem # 1:  HEARING LOSS (ICD-389.9) Assessment Unchanged  Future Orders: ENT Referral (ENT) ... 10/27/2009  Problem # 2:  HYPERLIPIDEMIA (ICD-272.4) Assessment: Unchanged  Her updated medication list for this problem includes:    Crestor 10 Mg Tabs (Rosuvastatin calcium) .[name]... Take 1 tab by mouth at bedtime  Labs  Reviewed: SGOT: 22 (09/14/2009)   SGPT: 21 (09/14/2009)   HDL:43 (09/14/2009), 46 (03/18/2009)  LDL:73 (09/14/2009), 69 (03/18/2009)  Chol:158 (09/14/2009), 135 (03/18/2009)  Trig:211 (09/14/2009), 101 (03/18/2009)  Problem # 3:  BACK PAIN WITH RADICULOPATHY (ICD-729.2) Assessment: Unchanged  Problem # 4:  HYPERTENSION (ICD-401.9) Assessment: Unchanged  The following medications were removed from the medication list:    Clonidine Hcl 0.2 Mg Tabs (Clonidine hcl) ..... One tablet in the morning and one and a half in the evening Her updated medication list for this problem includes:    Metoprolol Tartrate 25 Mg Tabs (Metoprolol tartrate) .[name]... Take 1 tablet by mouth two times a day    Amlodipine Besy-benazepril Hcl 5-40 Mg Caps (Amlodipine besy-benazepril hcl) .[name]... Take 1 tablet by mouth once a day    Furosemide 20 Mg Tabs (Furosemide) ..... Once daily as needed  Clonidine Hcl 0.3 Mg Tabs (Clonidine hcl) .[name]... Take 1 tablet by mouth two times a day  BP today: 170/78 Prior BP: 180/70 (10/18/2009)  Labs Reviewed: K+: 4.3 (09/14/2009) Creat: : 0.67 (09/14/2009)   Chol: 158 (09/14/2009)   HDL: 43 (09/14/2009)   LDL: 73 (09/14/2009)   TG: 211 (09/14/2009)  Problem # 5:  DIABETES MELLITUS (ICD-250.00) Assessment: Deteriorated  Her updated medication list for this problem includes:    Amlodipine Besy-benazepril Hcl 5-40 Mg Caps (Amlodipine besy-benazepril hcl) .[name]... Take 1 tablet by mouth once a day    Glipizide 10 Mg Tabs (Glipizide) .[name]... Take 1 tablet by mouth two times a day    Onglyza 5 Mg Tabs (Saxagliptin hcl) .[name]... Take 1 tablet by mouth once a day  Orders: T- Hemoglobin A1C ([address]454)  Labs Reviewed: Creat: 0.67 (09/14/2009)    Reviewed HgBA1c results: 8.2 (09/14/2009)  7.4 (06/08/2009)  Complete Medication List: 1)  Singulair 10 Mg Tabs (Montelukast sodium) .... One tab by mouth once daily 2)  Zoloft 100 Mg Tabs (Sertraline hcl) .... Two tabs by mouth once daily 3)   Evoxac 30 Mg Caps (Cevimeline hcl) .... One cap by mouth tid 4)  Mirtazapine 30 Mg Tabs (mirtazapine)  .... Take 1 tab by mouth at bedtime 5)  Levoxyl 50 Mcg Tabs (Levothyroxine sodium) .... Take 1 tablet by mouth once a day 6)  Gabapentin 300 Mg Caps (Gabapentin) .... Take 1 capsule by mouth three times a day 7)  Metoprolol Tartrate 25 Mg Tabs (Metoprolol tartrate) .... Take 1 tablet by mouth two times a day 8)  Crestor 10 Mg Tabs (Rosuvastatin calcium) .... Take 1 tab by mouth at bedtime 9)  Lamictal 100 Mg Tabs (Lamotrigine) .... One by mouth daily 10)  Amlodipine Besy-benazepril Hcl 5-40 Mg Caps (Amlodipine besy-benazepril hcl) .... Take 1 tablet by mouth once a day 11)  Trazodone Hcl 100 Mg Tabs (Trazodone hcl) .... Take 1 tab by mouth at bedtime 12)  Ativan 1 Mg Tabs (Lorazepam) .... Take 1 tablet by mouth four times a day 13)  Metoclopramide Hcl 5 Mg Tabs (Metoclopramide hcl) .... One by mouth qach and qhs 14)  Flexeril 10 Mg Tabs (Cyclobenzaprine hcl) .... Take 1 tablet by mouth two times a day 15)  Hydrocodone-acetaminophen 5-500 Mg Tabs (Hydrocodone-acetaminophen) .... Take 1 tablet by mouth two times a day 16)  Plavix 75 Mg Tabs (Clopidogrel bisulfate) .... Once daily 17)  Miacalcin 200 Unit/ml Soln (Calcitonin (salmon)) .... Once daily 18)  Lomotil 2.5-0.025 Mg Tabs (Diphenoxylate-atropine) .... As needed 19)  Glipizide 10 Mg Tabs (Glipizide) .... Take 1 tablet by mouth two times a day 20)  Zofran 4 Mg Tabs (Ondansetron hcl) .... Take 1 tablet by mouth once a day as needed severe nausea 21)  Onglyza 5 Mg Tabs (Saxagliptin hcl) .... Take 1 tablet by mouth once a day 22)  Tessalon Perles 100 Mg Caps (Benzonatate) .... One cap by mouth three times a day 23)  Flonase 50 Mcg/act Susp (Fluticasone propionate) .... Once daily 24)  Aciphex 20 Mg Tbec (Rabeprazole sodium) .... Once daily 25)  Nystop 100000 Unit/gm Powd (Nystatin) .... Two times a day 26)  Meclizine Hcl 12.5 Mg Tabs  (Meclizine hcl) .... As needed 27)  Furosemide 20 Mg Tabs (Furosemide) .... Once daily as needed 28)  Klor-con M20 20 Meq Cr-tabs (Potassium chloride crys cr) .... As needed with furosemide 29)  Vitamin D 50000u  .... Q week 30)  Doxycycline Hyclate 100 Mg Caps (  Doxycycline hyclate) .... Take 1 two times a day x 10 days 31)  Clonidine Hcl 0.3 Mg Tabs (Clonidine hcl) .... Take 1 tablet by mouth two times a day  Other Orders: T-CBC w/Diff ([id number]) T-Iron ([id number])  Patient Instructions: 1)  F/U 2nd week in october. 2)  You will be referred to ENT about persistent  left earpain and hearing loss. 3)  Your bP is still too high.I am increasing the clonidine to 0.3mg  one twice daily. 4)  The 2 mg tabs take one and a half twice daily till done 5)  I am glad that your blood sugars are improved Prescriptions: CLONIDINE HCL 0.3 MG TABS (CLONIDINE HCL) Take 1 tablet by mouth two times a day  #60 x 3   Entered and Authorized by:   Syliva Overman MD   Signed by:   Syliva Overman MD on 10/26/2009   Method used:   Printed then faxed to ...       [facility] (retail)       [address]       [place], [place]  [address]       Ph: [phone]       Fax: [phone]   RxID:   [id number]

## 2010-04-14 NOTE — Progress Notes (Signed)
  Phone Note Call from Patient   Caller: [name] Summary of Call: [name] called today and asked was it necessary for her to go to [facility]. l told her you wouldn't had referred her had you not thought it was in her best interest. She said she wanted to go now and said something to the effect of being put to sleep because you arent supposed to drive and l asked her did she have someone that would be going with her to drive her and she said yes.  Initial call taken by: ginger urshel     Appended Document:  [name] called again today. says that if possible wants to keep her original appt on March 24th.   Appended Document:  Pt is scheduled for Esophageal Manometry on 06/03/09@2 :00 p.m.

## 2010-04-14 NOTE — Letter (Signed)
Summary: misc  misc   Imported By: Curtis Sites 11/17/2009 16:43:12  _____________________________________________________________________  External Attachment:    Type:   Image     Comment:   External Document

## 2010-04-14 NOTE — Letter (Signed)
Summary: misc chart 1  misc chart 1   Imported By: Lind Guest 11/12/2009 08:20:10  _____________________________________________________________________  External Attachment:    Type:   Image     Comment:   External Document

## 2010-04-14 NOTE — Progress Notes (Signed)
Summary: CALL BACK  Phone Note Call from Patient   Summary of Call: WANTS YOU TO CALL HER Initial call taken by: Lind Guest,  March 31, 2009 1:21 PM  Follow-up for Phone Call        [name] called [name] back and spoke with her.  Follow-up by: Rudene Anda,  March 31, 2009 4:51 PM

## 2010-04-14 NOTE — Letter (Signed)
Summary: TRIAGE ORDER  TRIAGE ORDER   Imported By: Ave Filter 04/23/2009 10:43:03  _____________________________________________________________________  External Attachment:    Type:   Image     Comment:   External Document

## 2010-04-14 NOTE — Progress Notes (Signed)
Summary: CALL BACK  Phone Note Call from Patient   Summary of Call: CALL HER AT [phone] SOME SYMPTOMS SHE IS HAVING Initial call taken by: Lind Guest,  June 28, 2009 3:22 PM  Follow-up for Phone Call        patient states she is having some welts come up on skin like a reaction, not itching  advised patient if hard of breathing or feels like throat swelling go to er  states much better now  advised if no better tomorrow, call for ov Follow-up by: Adella Hare LPN,  June 28, 2009 5:23 PM

## 2010-04-14 NOTE — Progress Notes (Signed)
Summary: nausea  Phone Note Call from Patient Call back at Home Phone [phone]   Caller: Patient Summary of Call: spoke with pt- she is requesting an rx for nausea to be [facility]. please advise Initial call taken by: Hendricks Limes LPN,  October 13, 2009 4:53 PM     Appended Document: nausea    Prescriptions: ZOFRAN 4 MG TABS (ONDANSETRON HCL) Take 1 tablet by mouth once a day as needed severe nausea  #30 x 1   Entered and Authorized by:   Leanna Battles. Dixon Boos   Signed by:   Leanna Battles Lewis PA-C on 10/13/2009   Method used:   Electronically to        [facility] (retail)       [address]       [place], [place]  [address]       Ph: [phone]       Fax: [phone]   RxID:   [id number]     Appended Document: nausea ok; can Rx zofran 4 mg tab - one every 6 hours as needed nausea; disp #30 with 2 refills

## 2010-04-14 NOTE — Medication Information (Signed)
Summary: LIDOCAINE 2%  LIDOCAINE 2%   Imported By: Rexene Alberts 01/18/2010 11:14:18  _____________________________________________________________________  External Attachment:    Type:   Image     Comment:   External Document  Appended Document: LIDOCAINE 2% See AS note from 11/1.  Fwd to her.

## 2010-04-14 NOTE — Miscellaneous (Signed)
Summary: Orders Update  Clinical Lists Changes  Orders: Added new Test order of T-Hemoglobin and Hematocrit (1005) - Signed 

## 2010-04-14 NOTE — Progress Notes (Signed)
Summary: lab results  Phone Note Call from Patient   Summary of Call: needs to get results of lab work  [phone] Initial call taken by: Rudene Anda,  April 26, 2009 4:00 PM  Follow-up for Phone Call        patient aware Follow-up by: Lilyan Gilford LPN,  April 26, 2009 4:04 PM

## 2010-04-14 NOTE — Medication Information (Signed)
Summary: RX Folder  RX Folder   Imported By: Peggyann Shoals 07/22/2009 10:29:23  _____________________________________________________________________  External Attachment:    Type:   Image     Comment:   External Document  Appended Document: RX Folder done

## 2010-04-14 NOTE — Letter (Signed)
Summary: Letter  Letter   Imported By: Lind Guest 10/26/2009 15:55:59  _____________________________________________________________________  External Attachment:    Type:   Image     Comment:   External Document

## 2010-04-14 NOTE — Progress Notes (Signed)
Summary: [facility]  [facility]   Imported By: Lind Guest 09/03/2009 14:56:47  _____________________________________________________________________  External Attachment:    Type:   Image     Comment:   External Document

## 2010-04-14 NOTE — Progress Notes (Signed)
  Phone Note From Pharmacy   Caller: Robbie Lis apoth- abby Summary of Call: patient had clonidine patch and tabs, which should she be on? Initial call taken by: Adella Hare LPN,  May 06, 2009 3:07 PM  Follow-up for Phone Call        if she has been getting both up to the present then she continues on both, he bloodpressures havebeen good Follow-up by: Syliva Overman MD,  May 06, 2009 8:42 PM  Additional Follow-up for Phone Call Additional follow up Details #1::        pharmacy states not recommended to be on both tab and patch Additional Follow-up by: Adella Hare LPN,  May 07, 2009 10:41 AM    Additional Follow-up for Phone Call Additional follow up Details #2::    completed call, no change Follow-up by: Syliva Overman MD,  May 07, 2009 12:06 PM

## 2010-04-14 NOTE — Progress Notes (Signed)
Summary: [facility]   Imported By: Lind Guest 07/06/2009 09:57:53  _____________________________________________________________________  External Attachment:    Type:   Image     Comment:   External Document

## 2010-04-14 NOTE — Progress Notes (Signed)
[facility]   Imported By: Lind Guest 09/28/2009 14:11:11  _____________________________________________________________________  External Attachment:    Type:   Image     Comment:   External Document

## 2010-04-14 NOTE — Progress Notes (Signed)
Summary: fell  Phone Note Call from Patient   Summary of Call: pt wants to know if she applies for capp. she fell in tub over weekend. just sore and bruised. [phone] Initial call taken by: Rudene Anda,  November 01, 2009 9:31 AM  Follow-up for Phone Call        does patient qualify for capp services? 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

## 2010-04-14 NOTE — Progress Notes (Signed)
Summary: dr Gerilyn Pilgrim is sending her dr Romeo Apple  Phone Note Call from Patient   Summary of Call: dr. Gerilyn Pilgrim is sending her to dr. Diamantina Providence and she has been falling and can not help it she wants you to call her after hours Initial call taken by: Lind Guest,  April 04, 2010 4:23 PM  Follow-up for Phone Call        pls let her know I am aware, and she should go Follow-up by: Syliva Overman MD,  April 05, 2010 5:04 PM  Additional Follow-up for Phone Call Additional follow up Details #1::        Phone Call Completed Additional Follow-up by: Adella Hare LPN,  April 05, 2010 5:05 PM

## 2010-04-14 NOTE — Letter (Signed)
Summary: Recall, Labs Needed  [facility]  [address]   [place], [place] [address]   Phone: [phone]  Fax: [phone]    April 12, 2009  Jailey Burck [address] [place], [place]  [address] 10/14/1950   Dear Ms. Caniglia,   Our records indicate it is time to repeat your blood work.  You can take the enclosed form to the lab on or near the date indicated.  Please make note of the new location of the lab:   [address], 2nd floor   [facility]  Our office will call you within a week to ten business days with the results.  If you do not hear from [name] in 10 business days, you should call the office.  If you have any questions regarding this, call the office at [phone], and ask for the nurse.  Labs are due ASAP.  Please return stool sample to the office please.   Sincerely,    Hendricks Limes LPN  [facility] Ph: [phone]   Fax: [phone]

## 2010-04-14 NOTE — Letter (Signed)
Summary: dose increase  dose increase   Imported By: Lind Guest 03/15/2010 13:39:56  _____________________________________________________________________  External Attachment:    Type:   Image     Comment:   External Document

## 2010-04-14 NOTE — Letter (Signed)
Summary: progress notes  progress notes   Imported By: Curtis Sites 11/17/2009 16:42:42  _____________________________________________________________________  External Attachment:    Type:   Image     Comment:   External Document

## 2010-04-14 NOTE — Assessment & Plan Note (Signed)
Summary: office visit   Vital Signs:  Patient profile:   60 year old female Menstrual status:  hysterectomy Height:      62 inches Weight:      175.25 pounds BMI:     32.17 O2 Sat:      97 % on Room air Pulse rate:   101 / minute Pulse rhythm:   regular Resp:     16 per minute BP sitting:   170 / 90  (left arm)  Vitals Entered By: Adella Hare LPN (September 14, 9809 10:03 AM)  Nutrition Counseling: Patient's BMI is greater than 25 and therefore counseled on weight management options.  O2 Flow:  Room air CC: FOLLOW UP Is Patient Diabetic? Yes Did you bring your meter with you today? No   Primary Care Provider:  Lodema Hong  CC:  FOLLOW UP.  History of Present Illness: Reports  that she has been doing fairly well. Denies recent fever or chills. Denies sinus pressure, nasal congestion , ear pain or sore throat. Denies chest congestion, or cough productive of sputum. Denies chest pain, palpitations, PND, orthopnea or leg swelling. Denies abdominal pain,she does have chronic  nausea, and difficulty with bowel movements, diarreah alternating with constipation, but diarreah predominant.. Denies change in bowel movements or bloody stool. Denies dysuria , frequency, incontinence or hesitancy. chronic uncontrolled back pain with reduced mobility. Epidurals are not an option, neither is surgery. Denies headaches, vertigo, seizures. Denies depression, anxiety or insomnia.She takes medication for all3, and states that now , for the first time in her life that she is living independently, she is happiest. Denies  rash, lesions, or itch.  `   Current Medications (verified): 1)  Singulair 10 Mg  Tabs (Montelukast Sodium) .... One Tab By Mouth Once Daily 2)  Zoloft 100 Mg  Tabs (Sertraline Hcl) .... Two Tabs By Mouth Once Daily 3)  Evoxac 30 Mg Caps (Cevimeline Hcl) .... One Cap By Mouth Tid 4)  Mirtazapine 30 Mg Tabs (Mirtazapine) .... Take 1 Tab By Mouth At Bedtime 5)  Voltaren 1 % Gel  (Diclofenac Sodium) .... Apply Twice Daily As Needed To Affected Areas 6)  Levoxyl 50 Mcg Tabs (Levothyroxine Sodium) .... Take 1 Tablet By Mouth Once A Day 7)  Gabapentin 300 Mg Caps (Gabapentin) .... Take 1 Capsule By Mouth Three Times A Day 8)  Clonidine Hcl 0.2 Mg Tabs (Clonidine Hcl) .... Take 1 Tablet By Mouth Two Times A Day 9)  Metoprolol Tartrate 25 Mg Tabs (Metoprolol Tartrate) .... Take 1 Tablet By Mouth Two Times A Day 10)  Crestor 10 Mg Tabs (Rosuvastatin Calcium) .... Take 1 Tab By Mouth At Bedtime 11)  Lamictal 100 Mg Tabs (Lamotrigine) .... One By Mouth Daily 12)  Amlodipine Besy-Benazepril Hcl 5-40 Mg Caps (Amlodipine Besy-Benazepril Hcl) .... Take 1 Tablet By Mouth Once A Day 13)  Trazodone Hcl 100 Mg Tabs (Trazodone Hcl) .... Take 1 Tab By Mouth At Bedtime 14)  Ativan 1 Mg Tabs (Lorazepam) .... Take 1 Tablet By Mouth Four Times A Day 15)  Doxepin Hcl 50 Mg Caps (Doxepin Hcl) .... Take 1 Tablet By Mouth Three Times A Day 16)  Metoclopramide Hcl 5 Mg Tabs (Metoclopramide Hcl) .... One By Mouth Qach and Qhs 17)  Flexeril 10 Mg Tabs (Cyclobenzaprine Hcl) .... Take 1 Tablet By Mouth Two Times A Day 18)  Hydrocodone-Acetaminophen 5-500 Mg Tabs (Hydrocodone-Acetaminophen) .... Take 1 Tablet By Mouth Two Times A Day 19)  Carafate 1 Gm/79ml Susp (Sucralfate) .Marland KitchenMarland KitchenMarland Kitchen  2 Teaspoons (10ml) Qid 20)  Plavix 75 Mg Tabs (Clopidogrel Bisulfate) .... Once Daily 21)  Miacalcin 200 Unit/ml Soln (Calcitonin (Salmon)) .... Once Daily 22)  Lomotil 2.5-0.025 Mg Tabs (Diphenoxylate-Atropine) .... As Needed 23)  Glipizide 10 Mg Tabs (Glipizide) .... Take 1 Tablet By Mouth Two Times A Day 24)  Onglyza 2.5 Mg Tabs (Saxagliptin Hcl) .... Take 1 Daily  Allergies (verified): 1)  ! * Keflex 2)  ! Pcn 3)  ! Pyridium 4)  * Milk Products  Review of Systems General:  Complains of fatigue, malaise, and sleep disorder; denies chills and fever. Eyes:  Denies discharge, eye pain, and red eye. ENT:  Complains of  postnasal drainage and sore throat; tickle in back of troat with dry cough for 3 weeks, no fever or chills. GI:  Complains of abdominal pain, indigestion, nausea, and vomiting; denies constipation and diarrhea; increased and uncontrolled reflux symptoms , was recently in the Ed with chest pain, pain not associated with light headedness or nausea, states she is experiencing dysphagia for liquids and solids for the past 3  weeks. MS:  Complains of low back pain, mid back pain, and muscle weakness; bilateral lower ext weakness and musle spasm, states she wants no more epidural, it made her "fat"also states she was told she needed back surgery but  because of her multiple problems she is not a candidate . Endo:  Complains of excessive thirst and excessive urination; repoprts that she knows blood sugars are higher than they should be. Heme:  Denies abnormal bruising and bleeding. Allergy:  Denies hives or rash and itching eyes.  Physical Exam  General:  Well-developed,overweight ,in no acute distress; alert,appropriate and cooperative throughout examination HEENT: No facial asymmetry,  EOMI, No sinus tenderness, TM's Clear, oropharynx  pink and moist.   Chest: Clear to auscultation bilaterally.  CVS: S1, S2, No murmurs, No S3.   Abd: Soft, Nontender.  MS: decreased  ROM spine, hips, shoulders and knees.  Ext: No edema.   CNS: CN 2-12 intact, powe and r tone normal, and sensation reduced in feet    Skin: Intact, no visible lesions or rashes.  Psych: Good eye contact, normal affect.  Memory loss, not anxious or depressed appearing.   Diabetes Management Exam:    Foot Exam (with socks and/or shoes not present):       Sensory-Monofilament:          Left foot: diminished          Right foot: diminished       Inspection:          Left foot: normal          Right foot: normal       Nails:          Left foot: normal          Right foot: normal   Impression & Recommendations:  Problem # 1:   GASTROPARESIS (ICD-536.3) Assessment Deteriorated being followed by GI  Problem # 2:  DYSPHAGIA UNSPECIFIED (ICD-787.20) Assessment: Deteriorated to be discusesed with GI  Problem # 3:  NAUSEA (ICD-787.02) Assessment: Deteriorated  The following medications were removed from the medication list:    Meclizine Hcl 12.5 Mg Tabs (Meclizine hcl) .[name]... Three times a day as needed Her updated medication list for this problem includes:    Zofran 4 Mg Tabs (Ondansetron hcl) .[name]... Take 1 tablet by mouth once a day as needed severe nausea  Orders: Zofran 1mg . injection (W1191)  Problem #  4:  BACK PAIN WITH RADICULOPATHY (ICD-729.2) Assessment: Deteriorated  Problem # 5:  PSYCHOTIC D/O W/HALLUCINATIONS CONDS CLASS ELSW (ICD-293.82) Assessment: Improved  Problem # 6:  HYPERTENSION (ICD-401.9) Assessment: Deteriorated  The following medications were removed from the medication list:    Clonidine Hcl 0.2 Mg Tabs (Clonidine hcl) .[name]... Take 1 tablet by mouth two times a day    Furosemide 20 Mg Tabs (Furosemide) .[name]... Take 1 tablet by mouth once a day as needed Her updated medication list for this problem includes:    Metoprolol Tartrate 25 Mg Tabs (Metoprolol tartrate) .[name]... Take 1 tablet by mouth two times a day    Amlodipine Besy-benazepril Hcl 5-40 Mg Caps (Amlodipine besy-benazepril hcl) .[name]... Take 1 tablet by mouth once a day    Clonidine Hcl 0.2 Mg Tabs (Clonidine hcl) ..... One tablet in the morning and one and a half in the evening  Orders: T-Basic Metabolic Panel [id number])  BP today: 170/90 Prior BP: 120/60 (09/03/2009)  Labs Reviewed: K+: 4.7 (06/08/2009) Creat: : 0.73 (06/08/2009)   Chol: 135 (03/18/2009)   HDL: 46 (03/18/2009)   LDL: 69 (03/18/2009)   TG: 101 (03/18/2009)  Problem # 7:  DIABETES MELLITUS (ICD-250.00) Assessment: Deteriorated  The following medications were removed from the medication list:    Onglyza 2.5 Mg Tabs (Saxagliptin hcl) .[name]... Take 1  daily Her updated medication list for this problem includes:    Amlodipine Besy-benazepril Hcl 5-40 Mg Caps (Amlodipine besy-benazepril hcl) .[name]... Take 1 tablet by mouth once a day    Glipizide 10 Mg Tabs (Glipizide) .[name]... Take 1 tablet by mouth two times a day    Onglyza 5 Mg Tabs (Saxagliptin hcl) .[name]... Take 1 tablet by mouth once a day  Orders: T- Hemoglobin A1C ([id number])  Labs Reviewed: Creat: 0.73 (06/08/2009)    Reviewed HgBA1c results: 7.4 (06/08/2009)  6.6 (03/18/2009)  Complete Medication List: 1)  Singulair 10 Mg Tabs (Montelukast sodium) .... One tab by mouth once daily 2)  Zoloft 100 Mg Tabs (Sertraline hcl) .... Two tabs by mouth once daily 3)  Evoxac 30 Mg Caps (Cevimeline hcl) .... One cap by mouth tid 4)  Mirtazapine 30 Mg Tabs (mirtazapine)  .... Take 1 tab by mouth at bedtime 5)  Voltaren 1 % Gel (Diclofenac sodium) .... Apply twice daily as needed to affected areas 6)  Levoxyl 50 Mcg Tabs (Levothyroxine sodium) .... Take 1 tablet by mouth once a day 7)  Gabapentin 300 Mg Caps (Gabapentin) .... Take 1 capsule by mouth three times a day 8)  Metoprolol Tartrate 25 Mg Tabs (Metoprolol tartrate) .... Take 1 tablet by mouth two times a day 9)  Crestor 10 Mg Tabs (Rosuvastatin calcium) .... Take 1 tab by mouth at bedtime 10)  Lamictal 100 Mg Tabs (Lamotrigine) .... One by mouth daily 11)  Amlodipine Besy-benazepril Hcl 5-40 Mg Caps (Amlodipine besy-benazepril hcl) .... Take 1 tablet by mouth once a day 12)  Trazodone Hcl 100 Mg Tabs (Trazodone hcl) .... Take 1 tab by mouth at bedtime 13)  Ativan 1 Mg Tabs (Lorazepam) .... Take 1 tablet by mouth four times a day 14)  Doxepin Hcl 50 Mg Caps (Doxepin hcl) .... Take 1 tablet by mouth three times a day 15)  Metoclopramide Hcl 5 Mg Tabs (Metoclopramide hcl) .... One by mouth qach and qhs 16)  Flexeril 10 Mg Tabs (Cyclobenzaprine hcl) .... Take 1 tablet by mouth two times a day 17)  Hydrocodone-acetaminophen 5-500 Mg Tabs  (Hydrocodone-acetaminophen) .... Take 1  tablet by mouth two times a day 18)  Carafate 1 Gm/53ml Susp (Sucralfate) .... 2 teaspoons (10ml) qid 19)  Plavix 75 Mg Tabs (Clopidogrel bisulfate) .... Once daily 20)  Miacalcin 200 Unit/ml Soln (Calcitonin (salmon)) .... Once daily 21)  Lomotil 2.5-0.025 Mg Tabs (Diphenoxylate-atropine) .... As needed 22)  Glipizide 10 Mg Tabs (Glipizide) .... Take 1 tablet by mouth two times a day 23)  Zofran 4 Mg Tabs (Ondansetron hcl) .... Take 1 tablet by mouth once a day as needed severe nausea 24)  Clonidine Hcl 0.2 Mg Tabs (Clonidine hcl) .... One tablet in the morning and one and a half in the evening 25)  Clonidine Hcl 0.2 Mg Tabs (Clonidine hcl) .... One tablet in the morning and one and a half in the evening 26)  Onglyza 5 Mg Tabs (Saxagliptin hcl) .... Take 1 tablet by mouth once a day  Other Orders: T-Hepatic Function [phone]) T-Lipid Profile [id number] T-TSH [phone]) H pylori-[facility] (32440) Ketorolac-Toradol 15mg  (N0272) Admin of Therapeutic Inj  intramuscular or subcutaneous (53664)  Patient Instructions: 1)  F/U in 6 weeks. 2)  You will get toradol for your back pain and headache, and zofran for your nausea today 3)  Your blood presure is high today, increase dose of clonidine of clonidine starting today, this evening. 4)  BMP prior to visit, ICD-9: 5)  Hepatic Panel prior to visit, ICD-9: 6)  Lipid Panel prior to visit, ICD-9: 7)  TSH prior to visit, ICD-9: 8)  HbgA1C prior to visit, ICD-9: 9)  h pylori and h/h today   pls 10)  New med sent in for nausea use once daily as needed. 11)  Pls discuss your swallowing  probs with GI Prescriptions: ONGLYZA 5 MG TABS (SAXAGLIPTIN HCL) Take 1 tablet by mouth once a day  #30 x 3   Entered and Authorized by:   Syliva Overman MD   Signed by:   Syliva Overman MD on 09/17/2009   Method used:   Printed then faxed to ...       [facility] (retail)       [address]        [place], [place]  [address]       Ph: [phone]       Fax: [phone]   RxID:   [id number] CLONIDINE HCL 0.2 MG TABS (CLONIDINE HCL) one tablet in the morning and one and a half in the evening  #78 x 3   Entered and Authorized by:   Syliva Overman MD   Signed by:   Syliva Overman MD on 09/14/2009   Method used:   Printed then faxed to ...       [facility] (retail)       [address]       [place], [place]  [address]       Ph: [phone]       Fax: [phone]   RxID:   [id number] CLONIDINE HCL 0.2 MG TABS (CLONIDINE HCL) one tablet in the morning and one and a half in the evening  #78 x 3   Entered and Authorized by:   Syliva Overman MD   Signed by:   Syliva Overman MD on 09/14/2009   Method used:   Electronically to        [facility] (retail)       [address]  [place], [place]  [address]       Ph: [phone]       Fax: [phone]   RxID:   [id number] ZOFRAN 4 MG TABS (ONDANSETRON HCL) Take 1 tablet by mouth once a day as needed severe nausea  #30 x 1   Entered and Authorized by:   Syliva Overman MD   Signed by:   Syliva Overman MD on 09/14/2009   Method used:   Electronically to        [facility] (retail)       [address]       [place], [place]  [address]       Ph: [phone]       Fax: [phone]   RxID:   [id number]    Medication Administration  Injection # 1:    Medication: Ketorolac-Toradol 15mg     Diagnosis: HEADACHE (ICD-784.0)    Route: IM    Site: RUOQ gluteus    Exp Date: 02/11/2011    Lot #: [id number]    Mfr: [email or web address]    Comments: toradol 60mg  given    Patient tolerated injection without complications    Given by: Adella Hare LPN (September 14, 6299 11:23 AM)  Injection # 2:    Medication: Zofran 1mg . injection    Diagnosis: NAUSEA (ICD-787.02)    Route: IM    Site: LUOQ gluteus    Exp  Date: 10/12    Lot #: [id number]    Mfr: [email or web address]    Comments: zofran 4mg  given    Patient tolerated injection without complications    Given by: Adella Hare LPN (September 14, 2353 11:24 AM)  Orders Added: 1)  Est. Patient Level IV [73220] 2)  T-Basic Metabolic Panel [25427-06237] 3)  T-Hepatic Function [80076-22960] 4)  T-Lipid Profile [80061-22930] 5)  T- Hemoglobin A1C [83036-23375] 6)  T-TSH [62831-51761] 7)  H pylori-[facility] [87339] 8)  Ketorolac-Toradol 15mg  [J1885] 9)  Zofran 1mg . injection [J2405] 10)  Admin of Therapeutic Inj  intramuscular or subcutaneous [60737]

## 2010-04-14 NOTE — Miscellaneous (Signed)
Summary: Home Care Report  Home Care Report   Imported By: Lind Guest 07/13/2009 09:19:08  _____________________________________________________________________  External Attachment:    Type:   Image     Comment:   External Document

## 2010-04-14 NOTE — Medication Information (Signed)
Summary: LIDOCAINE  LIDOCAINE   Imported By: Rexene Alberts 01/11/2010 16:19:53  _____________________________________________________________________  External Attachment:    Type:   Image     Comment:   External Document  Appended Document: LIDOCAINE We won't prescribe at this time. Stopped Sept 2010, pt has hx of polypharmacy. When seen at last OV a few weeks ago was not complaining of abdominal pain.   Appended Document: LIDOCAINE Pt was informed.

## 2010-04-14 NOTE — Progress Notes (Signed)
Summary: sugar   Phone Note Call from Patient   Summary of Call: took blood sugar and didn't give number just said high. [phone] Initial call taken by: Rudene Anda,  April 13, 2009 10:23 AM  Follow-up for Phone Call        fasting blood sugar was too high to read this morning but has gone down to 253 Follow-up by: Worthy Keeler LPN,  April 13, 2009 2:09 PM  Additional Follow-up for Phone Call Additional follow up Details #1::        How often does she check her FBS?  What have her other readings been recently?  (Her diabetic labs Jan 2011 were good.) Additional Follow-up by: Esperanza Sheets PA,  April 13, 2009 2:55 PM    Additional Follow-up for Phone Call Additional follow up Details #2::    been running in the 200's for past couple days only, before that low 100's Follow-up by: Worthy Keeler LPN,  April 13, 2009 3:02 PM  Additional Follow-up for Phone Call Additional follow up Details #3:: Details for Additional Follow-up Action Taken: Watch diet closely & con't current meds since she was doing well up until a couple of days ago. Call tomorrow am if FBS too high again for monitor to read.  If blood sugars continue running in the 200's will need to add rx to help control blood sugar. Additional Follow-up by: Esperanza Sheets PA,  April 13, 2009 3:15 PM  patient aware

## 2010-04-14 NOTE — Letter (Signed)
Summary: [facility] PRO[facility]  [facility] PRO[facility]   Imported By: Ave Filter 06/24/2009 12:08:55  _____________________________________________________________________  External Attachment:    Type:   Image     Comment:   External Document

## 2010-04-14 NOTE — Assessment & Plan Note (Signed)
Summary: tailbone pain- ROOM 1   Vital Signs:  Patient profile:   60 year old female Menstrual status:  hysterectomy Height:      62 inches Weight:      172 pounds BMI:     31.57 O2 Sat:      98 % Pulse rate:   128 / minute Resp:     16 per minute BP sitting:   140 / 78  (left arm) Cuff size:   regular  Vitals Entered By: Everitt Amber LPN (May 13, 863 1:32 PM) CC: She is having pain in her tailbone. She falls alot but she doesn't remember hurting her tailbone during one of those incidents. She states she just woke up one morning and it was hurting. The last time she fell was Saturday   Referring Provider:  Dr. Lodema Hong Primary Provider:  Lodema Hong  CC:  She is having pain in her tailbone. She falls alot but she doesn't remember hurting her tailbone during one of those incidents. She states she just woke up one morning and it was hurting. The last time she fell was Saturday.  History of Present Illness: Pt here today with c/o pain in her Rt buttock area.  She states this has been going on x about 3 days now.  She does have freq falls but denies falling on this area or any injury.  Has chronic LBP with Rt LE radiculopathy.  Has appt with neurosurg the beginning of May.  She denies any numbeness or tingling in RLE.  toradol inj has helped in the past.  Pt states she stopped most of her medications over the weekend.  She states the pharmacist told her to.  She didn't stop some of them though because she knew she shouldnt.  Pt is uncertain today on some of the meds she is & isn't taking.  she's not even taking her Tylenol anymore as needed.   Allergies: 1)  ! * Keflex 2)  ! Pcn 3)  ! Pyridium 4)  * Milk Products  Past History:  Past medical history reviewed for relevance to current acute and chronic problems.  Past Medical History: Reviewed history from 02/22/2009 and no changes required. Bipolar d/o CVA H/O pancreatitis due to Depakote with normal EUS in 2006 OSTEOPOROSIS  (ICD-733.00) SLEEP APNEA (ICD-780.57) BACK PAIN, CHRONIC (ICD-724.5) NAUSEA, CHRONIC (ICD-787.02) DIABETES MELLITUS (ICD-250.00) TRIGGER FINGER (ICD-727.3) Anxiety Disorder Hypertension Migraines Simple adenomas and hyperplastic polyps on TCS in 2001 Last TCS was in 9/08 by Dr. Lina Sar for diarrhea.  Bx negative for microscopic colitits.  She had diverticulsis.   Glaucoma  Review of Systems General:  Denies chills and fever. CV:  Denies chest pain or discomfort and swelling of feet. Resp:  Denies cough and shortness of breath. MS:  Complains of low back pain. Derm:  Denies lesion(s) and rash. Neuro:  Denies numbness, seizures, and tingling.  Physical Exam  General:  alert, well-developed, and well-nourished.   Head:  Normocephalic and atraumatic without obvious abnormalities. No apparent alopecia or balding. Lungs:  Normal respiratory effort, chest expands symmetrically. Lungs are clear to auscultation, no crackles or wheezes. Heart:  Normal rate and regular rhythm. S1 and S2 normal without gallop, murmur, click, rub or other extra sounds. Msk:  decreased ROM.  Pt is nontender to palp Rt SI, sciatic notch & soft tissues.  Also nontender Rt hip. Extremities:  No clubbing, cyanosis, edema, or deformity noted with normal full range of motion of all joints.  Impression & Recommendations:  Problem # 1:  BACK PAIN WITH RADICULOPATHY (ICD-729.2) Assessment Unchanged Discussed with pt she needs to keep appt with neurosurg.  That this pain in her buttock area is prob coming from her back. Orders: Ketorolac-Toradol 15mg  (Y8657) Admin of Therapeutic Inj  intramuscular or subcutaneous (84696)  Problem # 2:  HYPERTENSION (ICD-401.9) Assessment: Improved  Her updated medication list for this problem includes:    Lotensin 40 Mg Tabs (Benazepril hcl) .[name]... Take 1 tablet by mouth once a day    Clonidine Hcl 0.2 Mg Tabs (Clonidine hcl) .[name]... Take 1 tablet by mouth two times a day     Metoprolol Tartrate 25 Mg Tabs (Metoprolol tartrate) .[name]... Take 1 tablet by mouth two times a day    Catapres-tts-3 0.3 Mg/24hr Ptwk (Clonidine hcl) .[name]... Apply one patch weekly    Amlodipine Besylate 5 Mg Tabs (Amlodipine besylate) .[name]... Take 1 tablet by mouth once a day  BP today: 140/78 Prior BP: 150/84 (04/23/2009)  Labs Reviewed: K+: 4.4 (12/28/2008) Creat: : 0.98 (04/13/2009)   Chol: 135 (03/18/2009)   HDL: 46 (03/18/2009)   LDL: 69 (03/18/2009)   TG: 101 (03/18/2009)  Problem # 3:  PSYCHOTIC D/O W/HALLUCINATIONS CONDS CLASS ELSW (ICD-293.82) Discussed with pt that she needs to restart her medications. I am uncertain if she misunderstood the pharmacist or if pt even had a conversation with the pharmacist about her meds.   Complete Medication List: 1)  Singulair 10 Mg Tabs (Montelukast sodium) .... One tab by mouth once daily 2)  Zoloft 100 Mg Tabs (Sertraline hcl) .... Two tabs by mouth once daily 3)  Dicyclomine Hcl 20 Mg Tabs (Dicyclomine hcl) .... Take 1 tab 3 times daily 4)  Evoxac 30 Mg Caps (Cevimeline hcl) .... One cap by mouth tid 5)  Boniva 150 Mg Tabs (Ibandronate sodium) .... One tab by mouth every month 6)  Mirtazapine 30 Mg Tabs (mirtazapine)  .... Take 1 tab by mouth at bedtime 7)  Proair Hfa 108 (90 Base) Mcg/act Aers (Albuterol sulfate) .... Two puffs qhs 8)  Tylenol 325 Mg Tabs (Acetaminophen) .... Take 1 tablet by mouth three times a day 9)  Zofran 4 Mg Tabs (Ondansetron hcl) .... Take 1 tablet by mouth once a day as needed 10)  Voltaren 1 % Gel (Diclofenac sodium) .... Apply twice daily as needed to affected areas 11)  Plavix 75 Mg Tabs (Clopidogrel bisulfate) .... Once daily 12)  Aciphex 20 Mg Tbec (Rabeprazole sodium) .... One by mouth two times a day 30 mins before a meal (breakfast and evening meal) 13)  Nystop 100000 Unit/gm Powd (Nystatin) .... Apply to affected areas twice daily 14)  Lotensin 40 Mg Tabs (Benazepril hcl) .... Take 1 tablet by mouth once a  day 15)  Levoxyl 50 Mcg Tabs (Levothyroxine sodium) .... Take 1 tablet by mouth once a day 16)  Flonase 50 Mcg/act Susp (Fluticasone propionate) .... Two sprays nostril daily 17)  Gabapentin 300 Mg Caps (Gabapentin) .... Take 1 capsule by mouth three times a day 18)  Clonidine Hcl 0.2 Mg Tabs (Clonidine hcl) .... Take 1 tablet by mouth two times a day 19)  Metoprolol Tartrate 25 Mg Tabs (Metoprolol tartrate) .... Take 1 tablet by mouth two times a day 20)  Vitamin D (ergocalciferol) 50000 Unit Caps (Ergocalciferol) .... One cap by mouth q week 21)  Robaxin 500 Mg Tabs (Methocarbamol) .... One by mouth q 6 hrs 22)  Catapres-tts-3 0.3 Mg/24hr Ptwk (Clonidine hcl) .... Apply  one patch weekly 23)  Amlodipine Besylate 5 Mg Tabs (Amlodipine besylate) .... Take 1 tablet by mouth once a day 24)  Lomotil 2.5-0.025 Mg Tabs (Diphenoxylate-atropine) .... Take 1 tablet by mouth four times a day as needed 25)  Glipizide 2.5 Mg Xr24h-tab (Glipizide) .... 2 tablets in the morning at breakfast and 1 tablet at suppertime 26)  Antivert 12.5 Mg Tabs (Meclizine hcl) .... Take 1 tablet by mouth three times a day as needed 27)  Crestor 10 Mg Tabs (Rosuvastatin calcium) .... Take 1 tab by mouth at bedtime 28)  Lamictal 100 Mg Tabs (Lamotrigine) .... One by mouth daily 29)  Doxycycline Hyclate 100 Mg Caps (Doxycycline hyclate) .... Take 1 capsule by mouth two times a day 30)  Tessalon Perles 100 Mg Caps (Benzonatate) .... Take 1 capsule by mouth three times a day 31)  Aurodex 1.4-5.4 % Soln (Benzocaine-antipyrine) .... 2 drops in affected ear(s) three times daily as needed 32)  Doxepin Hcl 50 Mg Caps (Doxepin hcl) .[name].. 1 cap at bedtime x 3 days, then 1 cap two times a day x 3 days, then 1 cap once daily 33)  Septra Ds 800-160 Mg Tabs (Sulfamethoxazole-trimethoprim) .... One tablet twice daily 34)  Fluconazole 150 Mg Tabs (Fluconazole) .... Take 1 tablet by mouth once a day as needed 35)  Glipizide 2.5 Mg Xr24h-tab  (Glipizide) .... Two tablets twice daily for 2 weeks, starting 04/23/2009, then resume previous dose 36)  Calcitonin (salmon) 200 Unit/act Soln (Calcitonin (salmon)) .... 200 units once daily to be sprayed in the nostril, alternating the nostrils  Patient Instructions: 1)  Keep appt with Dr Lodema Hong. 2)  Keep appt with Neurosugeon. 3)  Take 650-1000mg  of Tylenol every 4-6 hours as needed for relief of pain or comfort of fever AVOID taking more than 4000mg   in a 24 hour period (can cause liver damage in higher doses).   Medication Administration  Injection # 1:    Medication: Ketorolac-Toradol 15mg     Diagnosis: BACK PAIN WITH RADICULOPATHY (ICD-729.2)    Route: IM    Site: RUOQ gluteus    Exp Date: 10/2010    Lot #: [id number]     Mfr: [email or web address]    Comments: 60mg  given     Patient tolerated injection without complications    Given by: Everitt Amber LPN (May 12, 6061 4:23 PM)  Orders Added: 1)  Ketorolac-Toradol 15mg  [J1885] 2)  Admin of Therapeutic Inj  intramuscular or subcutaneous [96372] 3)  Est. Patient Level IV [01601]

## 2010-04-14 NOTE — Medication Information (Signed)
Summary: RX Folder  RX Folder   Imported By: Peggyann Shoals 07/20/2009 15:18:58  _____________________________________________________________________  External Attachment:    Type:   Image     Comment:   External Document  Appended Document: RX Folder please verify dose. should be twice per day per last ov note.  Appended Document: RX Folder pt stated she was taking it once daily   Appended Document: RX Folder-aciphex    Prescriptions: ACIPHEX 20 MG TBEC (RABEPRAZOLE SODIUM) one by mouth 30 mins before breakfast daily  #30 x 11   Entered and Authorized by:   Leanna Battles. Dixon Boos   Signed by:   Leanna Battles Aneya Daddona PA-C on 07/23/2009   Method used:   Electronically to        [facility] (retail)       [address]       [place], [place]  [address]       Ph: [phone]       Fax: [phone]   RxID:   [id number]

## 2010-04-14 NOTE — Progress Notes (Signed)
[facility]   Im[facility] By: Lind Guest 10/20/2009 11:36:46  _____________________________________________________________________  External Attachment:    Type:   Image     Comment:   External Document

## 2010-04-14 NOTE — Progress Notes (Signed)
Summary: EARACHE  Phone Note Call from Patient   Summary of Call: [facility]HE CALL BACK AT [phone] Initial call taken by: Lind Guest,  October 18, 2009 11:28 AM  Follow-up for Phone Call        patient states ear has been aching but putting sweet oil in it and it has been helping some, states she has appt next week but if it worsens she will call to come in sooner Follow-up by: Adella Hare LPN,  October 18, 2009 11:35 AM

## 2010-04-14 NOTE — Progress Notes (Signed)
Summary: MEDICATIONS  Phone Note Call from Patient   Summary of Call: SOMETHING FOE HER EAR  SOMETHING FOR THE COUGHING     WAHT WAS SUPPOSED TO BE CALLE D IN FOR HER AT [facility] PLEASE CALL HER BACK AT [phone] Initial call taken by: Lind Guest,  March 19, 2009 10:41 AM  Follow-up for Phone Call        pls call in and /or erx apothecary syrup 1 tsp four times daily as needed x 4 ounces only.  Tell her medicaid pays for no ear drops I have told her this before  Follow-up by: Syliva Overman MD,  March 19, 2009 1:30 PM  Additional Follow-up for Phone Call Additional follow up Details #1::        Phone Call Completed, Rx Called In Additional Follow-up by: Worthy Keeler LPN,  March 19, 2009 3:35 PM

## 2010-04-14 NOTE — Assessment & Plan Note (Signed)
Summary: FOLLOW UP FROM HOS   Vital Signs:  Patient profile:   59 year old female Menstrual status:  hysterectomy Height:      62 inches Weight:      177 pounds BMI:     32.49 O2 Sat:      93 % Pulse rate:   106 / minute Pulse rhythm:   regular Resp:     16 per minute BP sitting:   150 / 84 Cuff size:   regular  Vitals Entered By: Everitt Amber (April 23, 2009 9:53 AM)  Nutrition Counseling: Patient's BMI is greater than 25 and therefore counseled on weight management options. CC: has been falling, seems like everyday. Was told her back was getting worse, nauseated    Primary Care Provider:  Lodema Hong  CC:  has been falling, seems like everyday. Was told her back was getting worse, and nauseated .  History of Present Illness: pt recently seen in the Ed for un controlled blood sugar, at which time she presented with her sugar over 300. She reports no dietary indiscretion and states she is taking her meds regulaly. She continues to have ansteadiness, and has fallen repeatedly. She denies uncontrolleds depression or anxiety. She denies any recent fever or chills or head or chest congestion. She continues to suffer from back and lower extremity pain.  Allergies: 1)  ! * Keflex 2)  ! Pcn 3)  ! Pyridium 4)  * Milk Products  Review of Systems      See HPI Eyes:  Denies blurring and double vision. ENT:  Complains of postnasal drainage and sinus pressure; 1 week h/o maxillary pressure, green nasal drainage  and nasal congestion x 2 weeks, intermittent chills and fever. CV:  Denies chest pain or discomfort, palpitations, and swelling of hands. Resp:  Denies cough and sputum productive. GI:  Complains of nausea; denies constipation, diarrhea, and vomiting. GU:  Denies dysuria and urinary frequency. MS:  Complains of joint pain, joint swelling, low back pain, mid back pain, and muscle weakness; left ankle swollen and tender x 3 weeks. Neuro:  Complains of falling down, inability  to speak, and poor balance; denies seizures and sensation of room spinning. Endo:  checks twice daily when up, has been elevated for approx 1 week to 10 days. Heme:  Denies abnormal bruising and bleeding. Allergy:  Complains of seasonal allergies; denies hives or rash.  Physical Exam  General:  Well-developed,well-nourished,in no acute distress; alert,appropriate and cooperative throughout examination HEENT: No facial asymmetry,  EOMI,no  sinus tenderness, TM's Clear, oropharynx  pink and moist.   Chest:adequate air entry, no crackles or wheezes CVS: S1, S2, No murmurs, No S3.   Abd: Soft, Nontender.  MS: decreased ROM spine, hips, shoulders and knees.  Ext: No edema.   CNS: CN 2-12 intact, power tone and sensation normal throughout.   Skin no visible bruises Psych: Good eye contact, normal affect.  Memory loss, not anxious or depressed appearing.    Impression & Recommendations:  Problem # 1:  ANKLE PAIN (ICD-719.47) Assessment Comment Only  Orders: T-Uric Acid (Blood) ([address]-60454), need to eval for gout  Problem # 2:  HYPERTENSION (ICD-401.9) Assessment: Deteriorated  Her updated medication list for this problem includes:    Lotensin 40 Mg Tabs (Benazepril hcl) .[name]... Take 1 tablet by mouth once a day    Clonidine Hcl 0.2 Mg Tabs (Clonidine hcl) .[name]... Take 1 tablet by mouth two times a day    Metoprolol Tartrate 25 Mg Tabs (  Metoprolol tartrate) .[name]... Take 1 tablet by mouth two times a day    Catapres-tts-3 0.3 Mg/24hr Ptwk (Clonidine hcl) .[name]... Apply one patch weekly    Amlodipine Besylate 5 Mg Tabs (Amlodipine besylate) .[name]... Take 1 tablet by mouth once a day  BP today: 150/84 Prior BP: 134/56 (03/18/2009)  Labs Reviewed: K+: 4.4 (12/28/2008) Creat: : 0.98 (04/13/2009)   Chol: 135 (03/18/2009)   HDL: 46 (03/18/2009)   LDL: 69 (03/18/2009)   TG: 101 (03/18/2009)  Problem # 3:  DIABETES MELLITUS (ICD-250.00) Assessment: Deteriorated  Her updated medication list for  this problem includes:    Lotensin 40 Mg Tabs (Benazepril hcl) .[name]... Take 1 tablet by mouth once a day    Glipizide 2.5 Mg Xr24h-tab (Glipizide) .[name]... 2 tablets in the morning at breakfast and 1 tablet at suppertime    Glipizide 2.5 Mg Xr24h-tab (Glipizide) .[name]..[name] Two tablets twice daily for 2 weeks, starting 04/23/2009, then resume previous dose  Orders: T-Urine Microalbumin w/creat. ratio [phone])  Problem # 4:  BACK PAIN, CHRONIC (ICD-724.5) Assessment: Deteriorated  Her updated medication list for this problem includes:    Tylenol 325 Mg Tabs (Acetaminophen) .[name]... Take 1 tablet by mouth three times a day    Robaxin 500 Mg Tabs (Methocarbamol) ..... One by mouth q 6 hrs  Orders: Ketorolac-Toradol 15mg  (W2956)  Problem # 5:  NAUSEA (ICD-787.02) Assessment: Deteriorated  Her updated medication list for this problem includes:    Zofran 4 Mg Tabs (Ondansetron hcl) .[name]... Take 1 tablet by mouth once a day as needed    Antivert 12.5 Mg Tabs (Meclizine hcl) .[name]... Take 1 tablet by mouth three times a day as needed  Orders: Zofran 1mg . injection (O1308) Admin of Therapeutic Inj  intramuscular or subcutaneous (65784)  Complete Medication List: 1)  Singulair 10 Mg Tabs (Montelukast sodium) .... One tab by mouth once daily 2)  Zoloft 100 Mg Tabs (Sertraline hcl) .... Two tabs by mouth once daily 3)  Dicyclomine Hcl 20 Mg Tabs (Dicyclomine hcl) .... Take 1 tab 3 times daily 4)  Evoxac 30 Mg Caps (Cevimeline hcl) .... One cap by mouth tid 5)  Boniva 150 Mg Tabs (Ibandronate sodium) .... One tab by mouth every month 6)  Mirtazapine 30 Mg Tabs (mirtazapine)  .... Take 1 tab by mouth at bedtime 7)  Proair Hfa 108 (90 Base) Mcg/act Aers (Albuterol sulfate) .... Two puffs qhs 8)  Tylenol 325 Mg Tabs (Acetaminophen) .... Take 1 tablet by mouth three times a day 9)  Zofran 4 Mg Tabs (Ondansetron hcl) .... Take 1 tablet by mouth once a day as needed 10)  Voltaren 1 % Gel (Diclofenac  sodium) .... Apply twice daily as needed to affected areas 11)  Plavix 75 Mg Tabs (Clopidogrel bisulfate) .... Once daily 12)  Aciphex 20 Mg Tbec (Rabeprazole sodium) .... One by mouth two times a day 30 mins before a meal (breakfast and evening meal) 13)  Nystop 100000 Unit/gm Powd (Nystatin) .... Apply to affected areas twice daily 14)  Lotensin 40 Mg Tabs (Benazepril hcl) .... Take 1 tablet by mouth once a day 15)  Levoxyl 50 Mcg Tabs (Levothyroxine sodium) .... Take 1 tablet by mouth once a day 16)  Flonase 50 Mcg/act Susp (Fluticasone propionate) .... Two sprays nostril daily 17)  Gabapentin 300 Mg Caps (Gabapentin) .... Take 1 capsule by mouth three times a day 18)  Clonidine Hcl 0.2 Mg Tabs (Clonidine hcl) .... Take 1 tablet by mouth two times a day  19)  Metoprolol Tartrate 25 Mg Tabs (Metoprolol tartrate) .... Take 1 tablet by mouth two times a day 20)  Vitamin D (ergocalciferol) 50000 Unit Caps (Ergocalciferol) .... One cap by mouth q week 21)  Robaxin 500 Mg Tabs (Methocarbamol) .... One by mouth q 6 hrs 22)  Catapres-tts-3 0.3 Mg/24hr Ptwk (Clonidine hcl) .... Apply one patch weekly 23)  Amlodipine Besylate 5 Mg Tabs (Amlodipine besylate) .... Take 1 tablet by mouth once a day 24)  Lomotil 2.5-0.025 Mg Tabs (Diphenoxylate-atropine) .... Take 1 tablet by mouth four times a day as needed 25)  Glipizide 2.5 Mg Xr24h-tab (Glipizide) .... 2 tablets in the morning at breakfast and 1 tablet at suppertime 26)  Antivert 12.5 Mg Tabs (Meclizine hcl) .... Take 1 tablet by mouth three times a day as needed 27)  Crestor 10 Mg Tabs (Rosuvastatin calcium) .... Take 1 tab by mouth at bedtime 28)  Lamictal 100 Mg Tabs (Lamotrigine) .... One by mouth daily 29)  Doxycycline Hyclate 100 Mg Caps (Doxycycline hyclate) .... Take 1 capsule by mouth two times a day 30)  Tessalon Perles 100 Mg Caps (Benzonatate) .... Take 1 capsule by mouth three times a day 31)  Aurodex 1.4-5.4 % Soln (Benzocaine-antipyrine)  .... 2 drops in affected ear(s) three times daily as needed 32)  Doxepin Hcl 50 Mg Caps (Doxepin hcl) .[name].. 1 cap at bedtime x 3 days, then 1 cap two times a day x 3 days, then 1 cap once daily 33)  Septra Ds 800-160 Mg Tabs (Sulfamethoxazole-trimethoprim) .... One tablet twice daily 34)  Fluconazole 150 Mg Tabs (Fluconazole) .... Take 1 tablet by mouth once a day as needed 35)  Glipizide 2.5 Mg Xr24h-tab (Glipizide) .... Two tablets twice daily for 2 weeks, starting 04/23/2009, then resume previous dose  Patient Instructions: 1)  F/U in mid April. 2)  It is important that you exercise regularly at least 20 minutes 5 times a week. If you develop chest pain, have severe difficulty breathing, or feel very tired , stop exercising immediately and seek medical attention. 3)  You need to lose weight. Consider a lower calorie diet and regular exercise.  4)  You are being treated for sinusitis , meds are sent in . 5)  Please inc your blood sugar meds to 2 tabs twice daily.  Prescriptions: GLIPIZIDE 2.5 MG XR24H-TAB (GLIPIZIDE) two tablets twice daily for 2 weeks, starting 04/23/2009, then resume previous dose  #56 x 0   Entered and Authorized by:   Syliva Overman MD   Signed by:   Syliva Overman MD on 04/23/2009   Method used:   Electronically to        [facility] (retail)       [address]       [place], [place]  [address]       Ph: [phone]       Fax: [phone]   RxID:   [id number] FLUCONAZOLE 150 MG TABS (FLUCONAZOLE) Take 1 tablet by mouth once a day as needed  #3 x 0   Entered and Authorized by:   Syliva Overman MD   Signed by:   Syliva Overman MD on 04/23/2009   Method used:   Electronically to        [facility] (retail)       [address]       [place], [place]  [address]  Ph: [phone]       Fax: [phone]   RxID:   [id number] SEPTRA DS 800-160 MG TABS  (SULFAMETHOXAZOLE-TRIMETHOPRIM) one tablet twice daily  #20 x 0   Entered and Authorized by:   Syliva Overman MD   Signed by:   Syliva Overman MD on 04/23/2009   Method used:   Electronically to        [facility] (retail)       [address]       [place], [place]  [address]       Ph: [phone]       Fax: [phone]   RxID:   [id number]    Medication Administration  Injection # 1:    Medication: Ketorolac-Toradol 15mg     Diagnosis: BACK PAIN, CHRONIC (ICD-724.5)    Route: IM    Site: LUOQ gluteus    Exp Date: 10/2010    Lot #: [id number]    Mfr: [email or web address]    Comments: 60mg  given     Patient tolerated injection without complications    Given by: Everitt Amber (April 23, 2009 11:06 AM)  Injection # 2:    Medication: Zofran 1mg . injection    Diagnosis: NAUSEA (ICD-787.02)    Route: IM    Site: LUOQ gluteus    Exp Date: 10/12    Lot #: [id number]    Mfr: [email or web address]    Comments: 4mg  given     Patient tolerated injection without complications    Given by: Everitt Amber (April 23, 2009 11:06 AM)  Orders Added: 1)  Est. Patient Level IV [10932] 2)  T-Uric Acid (Blood) [35573-22025] 3)  T-Urine Microalbumin w/creat. ratio [82043-[address]] 4)  Ketorolac-Toradol 15mg  [J1885] 5)  Zofran 1mg . injection [J2405] 6)  Admin of Therapeutic Inj  intramuscular or subcutaneous [42706]

## 2010-04-14 NOTE — Letter (Signed)
Summary: Letter  Letter   Imported By: Lind Guest 09/03/2009 10:13:59  _____________________________________________________________________  External Attachment:    Type:   Image     Comment:   External Document

## 2010-04-14 NOTE — Miscellaneous (Signed)
Summary: Home Care Report  Home Care Report   Imported By: Lind Guest 07/15/2009 13:38:04  _____________________________________________________________________  External Attachment:    Type:   Image     Comment:   External Document

## 2010-04-14 NOTE — Letter (Signed)
Summary: ORDER FOR THERPEUTIC SHOES  ORDER FOR THERPEUTIC SHOES   Imported By: Lind Guest 06/07/2009 13:43:19  _____________________________________________________________________  External Attachment:    Type:   Image     Comment:   External Document

## 2010-04-14 NOTE — Progress Notes (Signed)
  Phone Note Call from Patient   Caller: Patient Summary of Call: On call note:  04/26/09 9:17 pm Pt called concerned about taking her medications.  States that she is unable to read the names or directions of the medications, even with her glasses on.  Is uncertain which is which, & is afraid to take her meds tonight.  Wants to not take any tonight.  Advised pt OK to hold meds tonight.  I will discuss with office/DrSimpson in the morning & see what we can do to help her. Initial call taken by: Esperanza Sheets PA,  May 25, 2009 8:33 AM

## 2010-04-14 NOTE — Letter (Signed)
Summary: office notes chart 1  office notes chart 1   Imported By: Lind Guest 11/12/2009 08:20:46  _____________________________________________________________________  External Attachment:    Type:   Image     Comment:   External Document

## 2010-04-14 NOTE — Medication Information (Signed)
Summary: DIPHENOXYLATE/ATROPINE TA  DIPHENOXYLATE/ATROPINE TA   Imported By: Rexene Alberts 01/21/2010 16:33:42  _____________________________________________________________________  External Attachment:    Type:   Image     Comment:   External Document  Appended Document: DIPHENOXYLATE/ATROPINE TA    Prescriptions: LOMOTIL 2.5-0.025 MG TABS (DIPHENOXYLATE-ATROPINE) 1 by mouth two times a day as needed for diarrhea  #60 x 0   Entered and Authorized by:   Joselyn Arrow FNP-BC   Signed by:   Joselyn Arrow FNP-BC on 01/24/2010   Method used:   Printed then faxed to ...       [facility] (retail)       [address]       [place], [place]  [address]       Ph: [phone]       Fax: [phone]   RxID:   [id number]     Appended Document: DIPHENOXYLATE/ATROPINE TA Faxedto [facility].

## 2010-04-14 NOTE — Assessment & Plan Note (Signed)
Summary: office visit   Vital Signs:  Patient profile:   60 year old female Menstrual status:  hysterectomy Height:      62 inches Weight:      173.50 pounds BMI:     31.85 O2 Sat:      96 % Pulse rate:   112 / minute Pulse rhythm:   regular Resp:     16 per minute BP sitting:   160 / 82  (left arm) Cuff size:   regular  Vitals Entered By: [name] Amber LPN (June 08, 2009 10:16 AM)  Nutrition Counseling: Patient's BMI is greater than 25 and therefore counseled on weight management options. CC: Follow up chronic problems, same back pain that she always has   Primary Care Provider:  Lodema [name]  CC:  Follow up chronic problems and same back pain that she always has.  History of Present Illness: Larey Seat 12 days ago while leaving the beauty shop, was attemptimng to dial her cell and had a 4 pronged cane, she was to have come into this visit with a family member who has sent a message on the phone about concern about the patient's safety. I had requested that they both come in as he real question in my mind is whethter [name] is able to continue to live independently. [name] sytate s she feels much better, and does not wanttolive with anyone. She is concerned about the number of meds she is on, and wishes to reduce them , which I believe  is an excellent idea. She denies uncontrolled depression, or psychotic episodes. She does not feel she would benefit from a pWC at this time. She denies hypoglycemis episodes or marked fluctuations in her sugar. She reports a good apetitie and regular bowel movements.  Current Medications (verified): 1)  Singulair 10 Mg  Tabs (Montelukast Sodium) .... One Tab By Mouth Once Daily 2)  Zoloft 100 Mg  Tabs (Sertraline Hcl) .... Two Tabs By Mouth Once Daily 3)  Dicyclomine Hcl 20 Mg  Tabs (Dicyclomine Hcl) .... Take 1 Tab 3 Times Daily 4)  Evoxac 30 Mg Caps (Cevimeline Hcl) .... One Cap By Mouth Tid 5)  Mirtazapine 30 Mg Tabs (Mirtazapine) .... Take 1 Tab By  Mouth At Bedtime 6)  Tylenol 325 Mg Tabs (Acetaminophen) .... Take 1 Tablet By Mouth Three Times A Day 7)  Zofran 4 Mg Tabs (Ondansetron Hcl) .... Take 1 Tablet By Mouth Once A Day As Needed 8)  Voltaren 1 % Gel (Diclofenac Sodium) .... Apply Twice Daily As Needed To Affected Areas 9)  Aciphex 20 Mg Tbec (Rabeprazole Sodium) .... One By Mouth Two Times A Day 30 Mins Before A Meal (Breakfast and Evening Meal) 10)  Levoxyl 50 Mcg Tabs (Levothyroxine Sodium) .... Take 1 Tablet By Mouth Once A Day 11)  Gabapentin 300 Mg Caps (Gabapentin) .... Take 1 Capsule By Mouth Three Times A Day 12)  Clonidine Hcl 0.2 Mg Tabs (Clonidine Hcl) .... Take 1 Tablet By Mouth Two Times A Day 13)  Metoprolol Tartrate 25 Mg Tabs (Metoprolol Tartrate) .... Take 1 Tablet By Mouth Two Times A Day 14)  Vitamin D (Ergocalciferol) 50000 Unit Caps (Ergocalciferol) .... One Cap By Mouth Q Week 15)  Robaxin 500 Mg Tabs (Methocarbamol) .... One By Mouth Q 6 Hrs 16)  Crestor 10 Mg Tabs (Rosuvastatin Calcium) .... Take 1 Tab By Mouth At Bedtime 17)  Lamictal 100 Mg Tabs (Lamotrigine) .... One By Mouth Daily 18)  Calcitonin (Salmon) 200 Unit/act  Soln (Calcitonin (Salmon)) .... 200 Units Once Daily To Be Sprayed in The Nostril, Alternating The Nostrils 19)  Hydrocodone-Acetaminophen 5-500 Mg Tabs (Hydrocodone-Acetaminophen) .[name].. 1 Two Times A Day As Needed Back Pain 20)  Glipizide 5 Mg Tabs (Glipizide) .... One Tablet in The Morning At Breakfast, and Half in The Evening At Supper 21)  Amlodipine Besy-Benazepril Hcl 5-40 Mg Caps (Amlodipine Besy-Benazepril Hcl) .... Take 1 Tablet By Mouth Once A Day 22)  Vitamin B-12 50 Mcg Tabs (Cyanocobalamin) .... Take 1 Tablet By Mouth Once A Day 23)  Trazodone Hcl 100 Mg Tabs (Trazodone Hcl) .... Take 1 Tab By Mouth At Bedtime 24)  Ativan 1 Mg Tabs (Lorazepam) .... Take 1 Tablet By Mouth Four Times A Day 25)  Doxepin Hcl 50 Mg Caps (Doxepin Hcl) .... Take 1 Tablet By Mouth Three Times A  Day  Allergies (verified): 1)  ! * Keflex 2)  ! Pcn 3)  ! Pyridium 4)  * Milk Products  Review of Systems      See HPI General:  Complains of fatigue and sleep disorder; denies chills and fever. Eyes:  Denies blurring, discharge, eye pain, and red eye. CV:  Denies chest pain or discomfort, palpitations, and swelling of feet. Resp:  Denies cough and sputum productive. GI:  Complains of abdominal pain; denies constipation, diarrhea, nausea, and vomiting; gerd symptoms are improved. GU:  Denies dysuria and urinary frequency. MS:  Complains of joint pain, low back pain, mid back pain, and muscle weakness. Neuro:  Complains of falling down, headaches, memory loss, and poor balance. Psych:  Complains of anxiety, depression, and mental problems; denies suicidal thoughts/plans, thoughts of violence, and unusual visions or sounds. Endo:  Denies cold intolerance, excessive hunger, excessive thirst, excessive urination, heat intolerance, polyuria, and weight change; tests daily and fasting sugars are seldom over 130. Heme:  Denies abnormal bruising and bleeding. Allergy:  Complains of seasonal allergies.  Physical Exam  General:  Well-developed,well-nourished,in no acute distress; alert,appropriate and cooperative throughout examination HEENT: No facial asymmetry,  EOMI, No sinus tenderness, TM's Clear, oropharynx  pink and moist.   Chest: Clear to auscultation bilaterally.  CVS: S1, S2, No murmurs, No S3.   Abd: Soft, Nontender.  JW:[id number]  ROM spine,adequate in  hips, shoulders and knees.  Ext: No edema.   CNS: CN 2-12 intact, power tone and sensation normal throughout.   Skin: Intact, no visible lesions or rashes.  Psych: Good eye contact, normal affect.  Memory intact, not anxious or depressed appearing.   Diabetes Management Exam:    Foot Exam (with socks and/or shoes not present):       Sensory-Monofilament:          Left foot: diminished          Right foot: diminished        Inspection:          Left foot: normal          Right foot: normal       Nails:          Left foot: normal          Right foot: normal   Impression & Recommendations:  Problem # 1:  DIABETES MELLITUS (ICD-250.00) Assessment Comment Only  The following medications were removed from the medication list:    Glipizide 2.5 Mg Xr24h-tab (Glipizide) .[name]... 2 tablets in the morning at breakfast and 1 tablet at suppertime Her updated medication list for this problem includes:    Glipizide  5 Mg Tabs (Glipizide) ..... One tablet in the morning at breakfast, and half in the evening at supper    Amlodipine Besy-benazepril Hcl 5-40 Mg Caps (Amlodipine besy-benazepril hcl) .[name]... Take 1 tablet by mouth once a day  Labs Reviewed: Creat: 0.98 (04/13/2009)    Reviewed HgBA1c results: 6.6 (03/18/2009)  7.0 (12/11/2008)  Problem # 2:  HYPERLIPIDEMIA (ICD-272.4) Assessment: Comment Only  Her updated medication list for this problem includes:    Crestor 10 Mg Tabs (Rosuvastatin calcium) .[name]... Take 1 tab by mouth at bedtime  Labs Reviewed: SGOT: 18 (03/18/2009)   SGPT: 17 (03/18/2009)   HDL:46 (03/18/2009), 46 (12/28/2008)  LDL:69 (03/18/2009), 106 (16/12/9602)  Chol:135 (03/18/2009), 190 (12/28/2008)  Trig:101 (03/18/2009), 192 (12/28/2008)  Problem # 3:  HYPERTENSION (ICD-401.9) Assessment: Unchanged  The following medications were removed from the medication list:    Catapres-tts-3 0.3 Mg/24hr Ptwk (Clonidine hcl) .[name]... Apply one patch weekly Her updated medication list for this problem includes:    Clonidine Hcl 0.2 Mg Tabs (Clonidine hcl) .[name]... Take 1 tablet by mouth two times a day    Metoprolol Tartrate 25 Mg Tabs (Metoprolol tartrate) .[name]... Take 1 tablet by mouth two times a day    Amlodipine Besy-benazepril Hcl 5-40 Mg Caps (Amlodipine besy-benazepril hcl) .[name]... Take 1 tablet by mouth once a day  Orders: T-Basic Metabolic Panel [id number])  BP today: 160/82 Prior BP: 170/80  (05/18/2009)  Labs Reviewed: K+: 4.4 (12/28/2008) Creat: : 0.98 (04/13/2009)   Chol: 135 (03/18/2009)   HDL: 46 (03/18/2009)   LDL: 69 (03/18/2009)   TG: 101 (03/18/2009)  Problem # 4:  BACK PAIN WITH RADICULOPATHY (ICD-729.2) Assessment: Unchanged pt reports she is being referred by the pin specialist to a neurosurgeon  Problem # 5:  GERD (ICD-530.81) Assessment: Improved  The following medications were removed from the medication list:    Dicyclomine Hcl 20 Mg Tabs (Dicyclomine hcl) .[name]... Take 1 tab 3 times daily Her updated medication list for this problem includes:    Aciphex 20 Mg Tbec (Rabeprazole sodium) ..... 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 One tab by mouth once daily 2)  Zoloft 100 Mg Tabs (Sertraline hcl) .... Two tabs by mouth once daily 3)  Evoxac 30 Mg Caps (Cevimeline hcl) .... One cap by mouth tid 4)  Mirtazapine 30 Mg Tabs (mirtazapine)  .... Take 1 tab by mouth at bedtime 5)  Voltaren 1 % Gel (Diclofenac sodium) .... Apply twice daily as needed to affected areas 6)  Aciphex 20 Mg Tbec (Rabeprazole sodium) .... One by mouth two times a day 30 mins before a meal (breakfast and evening meal) 7)  Levoxyl 50 Mcg Tabs (Levothyroxine sodium) .... Take 1 tablet by mouth once a day 8)  Gabapentin 300 Mg Caps (Gabapentin) .... Take 1 capsule by mouth three times a day 9)  Clonidine Hcl 0.2 Mg Tabs (Clonidine hcl) .... Take 1 tablet by mouth two times a day 10)  Metoprolol Tartrate 25 Mg Tabs (Metoprolol tartrate) .... Take 1 tablet by  mouth two times a day  11)  Vitamin D (ergocalciferol) 50000 Unit Caps (Ergocalciferol) .... One cap by mouth q week 12)  Crestor 10 Mg Tabs (Rosuvastatin calcium) .... Take 1 tab by mouth at bedtime 13)  Lamictal 100 Mg Tabs (Lamotrigine) .... One by mouth daily 14)  Calcitonin (salmon) 200 Unit/act Soln (Calcitonin (salmon)) .... 200 units once daily to be sprayed in the nostril, alternating the nostrils 15)  Glipizide 5 Mg Tabs (Glipizide) .... One tablet in the morning at breakfast, and half in the evening at supper 16)  Amlodipine Besy-benazepril Hcl 5-40 Mg Caps (Amlodipine besy-benazepril hcl) .... Take 1 tablet by mouth once a day 17)  Vitamin B-12 50 Mcg Tabs (Cyanocobalamin) .... Take 1 tablet by mouth once a day 18)  Trazodone Hcl 100 Mg Tabs (Trazodone hcl) .... Take 1 tab by mouth at bedtime 19)  Ativan 1 Mg Tabs (Lorazepam) .... Take 1 tablet by mouth four times a day 20)  Doxepin Hcl 50 Mg Caps (Doxepin hcl) .... Take 1 tablet by mouth three times a day  Other Orders: T- Hemoglobin A1C ([id number]) T-Vitamin D (25-Hydroxy) ([id number])  Patient Instructions: 1)  Please schedule a follow-up appointment in 2 months. 2)  you will be stopping some of your medicines, the new list is the complete lest, and I will notify the pharmacy. 3)  Vit D, chem 7 and hBA1c today 4)  pLS be careful about falling

## 2010-04-14 NOTE — Progress Notes (Signed)
Summary: CALL BACK  Phone Note Call from Patient   Summary of Call: LEFT A MESSAGE FOR YOU TO CALL HER BACK AT [phone] Initial call taken by: Lind Guest,  April 07, 2010 2:03 PM  Follow-up for Phone Call        patient wants a bedside commode due to falling all the time and states she doesnt want to go to nursing home  [facility] Follow-up by: Adella Hare LPN,  April 07, 2010 2:10 PM  Additional Follow-up for Phone Call Additional follow up Details #1::        rx written pls fax and let her know Additional Follow-up by: Syliva Overman MD,  April 08, 2010 8:18 AM    Additional Follow-up for Phone Call Additional follow up Details #2::    RX faxed to [facility]othe[facility]  Follow-up by: Everitt Amber LPN,  April 08, 2010 9:53 AM

## 2010-04-14 NOTE — Letter (Signed)
Summary: phone notes  phone notes   Imported By: Curtis Sites 11/17/2009 16:42:24  _____________________________________________________________________  External Attachment:    Type:   Image     Comment:   External Document

## 2010-04-14 NOTE — Letter (Signed)
Summary: Letter  Letter   Imported By: Lind Guest 04/07/2009 14:57:42  _____________________________________________________________________  External Attachment:    Type:   Image     Comment:   External Document

## 2010-04-14 NOTE — Progress Notes (Signed)
Summary: phone note/ dull..constant pain left side x 2 days  Phone Note Call from Patient   Caller: Patient Summary of Call: Pt called and said she has had a constant dull pain in her left side since yesterday. Stool darker the last two days. (She also said her urine was dark... and i asked her to let PCP know about that) Please advise! Initial call taken by: Cloria Spring LPN,  December 15, 2009 2:58 PM     Appended Document: phone note/ dull..constant pain left side x 2 days sounds like a new problem which may or may not be GI in origin; recommend see PCP first.  Appended Document: phone note/ dull..constant pain left side x 2 days Pt informed. Said she has called PCP's office and is waiting for a call back.

## 2010-04-14 NOTE — Progress Notes (Signed)
[facility]   Im[facility] By: Lind Guest 05/17/2009 14:19:48  _____________________________________________________________________  External Attachment:    Type:   Image     Comment:   External Document

## 2010-04-14 NOTE — Miscellaneous (Signed)
Summary: Home Care Report  Home Care Report   Imported By: Lind Guest 07/16/2009 10:38:02  _____________________________________________________________________  External Attachment:    Type:   Image     Comment:   External Document

## 2010-04-14 NOTE — Progress Notes (Signed)
Summary: Triage Order  ---- Converted from flag ---- ---- 04/23/2009 11:22 AM, Jonathon Bellows MD wrote: yes; ok to schedule  ---- 04/23/2009 10:49 AM, Ave Filter wrote: Dr Jena Gauss, This is the pt that you and I talked about this week in the office.Marland KitchenMarland KitchenShe is scheduled for next week and I need to get her instructions to her.  Please advise on signing off triage? ------------------------------

## 2010-04-14 NOTE — Progress Notes (Signed)
Summary: WANTS A MAMMO  Phone Note Call from Patient   Summary of Call: SHE IS HAVING SOME TINGLING IN HER NIPPLE AND BRUISING UNDER HER BREASTS AND [facility] AND TRI[facility] TO SCHEDULE AN APPOINMENT BUT SHE IS NOT DUE UNTIL JAN. AND THE LADY TOLD HER THAT SHE WOULD NEED A ORDER FROM HER PCP FOR A DIAG. MAMMO PLEASE CALL HER BACK AND LET HER KNOW Initial call taken by: Lind Guest,  January 25, 2010 4:23 PM  Follow-up for Phone Call        pls order mamo eval new breast pain, let her know, I will put in referral box Follow-up by: Syliva Overman MD,  January 26, 2010 6:26 AM  Additional Follow-up for Phone Call Additional follow up Details #1::        appt. 11.22.11 patient is aware Additional Follow-up by: Lind Guest,  January 26, 2010 11:07 AM  New Problems: MASTALGIA (ICD-611.71)   New Problems: MASTALGIA (ICD-611.71)

## 2010-04-14 NOTE — Progress Notes (Signed)
Summary: BLOOD SUGAR  Phone Note Call from Patient   Summary of Call: HER BLOOD SUGAR WAS 400 AND HAS COME DOWN TO 223 WANTS U TO CALL HER Initial call taken by: Lind Guest,  July 09, 2009 10:27 AM  Follow-up for Phone Call        returned call, busy signal Follow-up by: Adella Hare LPN,  July 09, 2009 2:09 PM  Additional Follow-up for Phone Call Additional follow up Details #1::        patient went to er this weekend and spoke with brandi today Additional Follow-up by: Adella Hare LPN,  Jul 12, 1608 1:45 PM

## 2010-04-14 NOTE — Progress Notes (Signed)
Summary: meds  Phone Note Call from Patient   Summary of Call: needs to speak with nurse medication.    [phone]       Initial call taken by: Rudene Anda,  Jul 22, 2009 10:15 AM  Follow-up for Phone Call        has questions about side effects and interactions with her other drugs Follow-up by: Adella Hare LPN,  Jul 22, 2009 1:50 PM  Additional Follow-up for Phone Call Additional follow up Details #1::        If she has concerns about the Onglyza then dont start it.  Continue her current medications and schedule an appt with Dr Lodema Hong regarding her blood sugars. Additional Follow-up by: Esperanza Sheets PA,  Jul 22, 2009 2:43 PM    Additional Follow-up for Phone Call Additional follow up Details #2::    Patient aware. Said she isn't going to take it but if her sugars are still high then she will make app Follow-up by: Everitt Amber LPN,  Jul 22, 2009 2:48 PM

## 2010-04-14 NOTE — Letter (Signed)
Summary: [name]  [name]   Imported By: Lind Guest 10/19/2009 09:09:14  _____________________________________________________________________  External Attachment:    Type:   Image     Comment:   External Document

## 2010-04-14 NOTE — Progress Notes (Signed)
Summary: order for a wheelchair  Phone Note Call from Patient   Summary of Call: needs for someone to order her a wheelchair problems with back  call her back to let her know Initial call taken by: Lind Guest,  April 29, 2009 8:04 AM  Follow-up for Phone Call        pls contact the pt and let her know what i conveyed to you Follow-up by: Syliva Overman MD,  April 29, 2009 12:32 PM  Additional Follow-up for Phone Call Additional follow up Details #1::        pt was called and left message on what office was going to do for here.  Additional Follow-up by: Rudene Anda,  April 29, 2009 1:04 PM

## 2010-04-14 NOTE — Progress Notes (Signed)
Summary: PPI Prior Auth  ---- Converted from flag ---- ---- 08/23/2009 3:21 PM, Cloria Spring LPN wrote:   ---- 08/23/2009 1:21 PM, Joselyn Arrow FNP-BC wrote: Can you please find out what PPIs pt has taken and whether she had failure prior to [facility] for PA.  Thanks ------------------------------ pt has tried and failed: omeprazole 20mg  once daily and two times a day                                 : lansoprazole 30mg  once daily  Phone Note From Other Clinic

## 2010-04-14 NOTE — Assessment & Plan Note (Signed)
Summary: 6wk fu, gu   Visit Type:  Follow-up Visit Primary Care Provider:  Lodema Hong  Chief Complaint:  F/U .  History of Present Illness: [name] is here for f/u visit. She has h/o gastroparesis, GERD, IBS, chronic anemia, dysphagia. Her predominant complaint is of ongoing dysphagia which has been most problematic since 1/11. She had EGD in 2/11 without evidence of esophageal stricture. This was done for abnormal BPE which showed barium tablet lodging at GEJ. She had MBSS but did not have significant oropharyngeal dysphagia. BPE was s/o underlying esophageal motility d/o. We sent her for esophageal manometry at [facility] but they were unable to get the catheter down, so study was not done.   Patient has the most problems swallowing large pills and meats. She is trying to chew food thoroughly. She has not had any weight loss.  She failed to mention to me that she was hospitalized last month for a fall and confusion.  H/H 9.1/28.5, MCV 72.2 (h/o chronic microcytic anemia). LFTS were normal (previously abnormal alk phos).   Current Medications (verified): 1)  Singulair 10 Mg  Tabs (Montelukast Sodium) .... One Tab By Mouth Once Daily 2)  Zoloft 100 Mg  Tabs (Sertraline Hcl) .... Two Tabs By Mouth Once Daily 3)  Evoxac 30 Mg Caps (Cevimeline Hcl) .... One Cap By Mouth Tid 4)  Mirtazapine 30 Mg Tabs (Mirtazapine) .... Take 1 Tab By Mouth At Bedtime 5)  Voltaren 1 % Gel (Diclofenac Sodium) .... Apply Twice Daily As Needed To Affected Areas 6)  Aciphex 20 Mg Tbec (Rabeprazole Sodium) .... One By Mouth Two Times A Day 30 Mins Before A Meal (Breakfast and Evening Meal) 7)  Levoxyl 50 Mcg Tabs (Levothyroxine Sodium) .... Take 1 Tablet By Mouth Once A Day 8)  Gabapentin 300 Mg Caps (Gabapentin) .... Take 1 Capsule By Mouth Three Times A Day 9)  Clonidine Hcl 0.2 Mg Tabs (Clonidine Hcl) .... Take 1 Tablet By Mouth Two Times A Day 10)  Metoprolol Tartrate 25 Mg Tabs (Metoprolol Tartrate) .... Take 1 Tablet By  Mouth Two Times A Day 11)  Vitamin D (Ergocalciferol) 50000 Unit Caps (Ergocalciferol) .... One Cap By Mouth Q Week 12)  Crestor 10 Mg Tabs (Rosuvastatin Calcium) .... Take 1 Tab By Mouth At Bedtime 13)  Lamictal 100 Mg Tabs (Lamotrigine) .... One By Mouth Daily 14)  Calcitonin (Salmon) 200 Unit/act Soln (Calcitonin (Salmon)) .... 200 Units Once Daily To Be Sprayed in The Nostril, Alternating The Nostrils 15)  Glipizide 5 Mg Tabs (Glipizide) .... One Tablet in The Morning At Breakfast, and Half in The Evening At Supper 16)  Amlodipine Besy-Benazepril Hcl 5-40 Mg Caps (Amlodipine Besy-Benazepril Hcl) .... Take 1 Tablet By Mouth Once A Day 17)  Trazodone Hcl 100 Mg Tabs (Trazodone Hcl) .... Take 1 Tab By Mouth At Bedtime 18)  Ativan 1 Mg Tabs (Lorazepam) .... Take 1 Tablet By Mouth Four Times A Day 19)  Doxepin Hcl 50 Mg Caps (Doxepin Hcl) .... Take 1 Tablet By Mouth Three Times A Day 20)  Metoclopramide Hcl 5 Mg Tabs (Metoclopramide Hcl) .... One By Mouth Qach and Qhs 21)  Flexeril 10 Mg Tabs (Cyclobenzaprine Hcl) .... Take 1 Tablet By Mouth Two Times A Day 22)  Hydrocodone-Acetaminophen 5-500 Mg Tabs (Hydrocodone-Acetaminophen) .... Take 1 Tablet By Mouth Two Times A Day 23)  Proair Hfa 108 (90 Base) Mcg/act Aers (Albuterol Sulfate) .... 2 Puffs At Bedtime 24)  Carafate 1 Gm/5ml Susp (Sucralfate) .... 2 Teaspoons (10ml)  Qid 25)  Flonase 50 Mcg/act Susp (Fluticasone Propionate) .... 2 Sprays Once Daily 26)  Nystop 100000 Unit/gm Powd (Nystatin) .... Two Times A Day 27)  Plavix 75 Mg Tabs (Clopidogrel Bisulfate) .... Once Daily 28)  Miacalcin 200 Unit/ml Soln (Calcitonin (Salmon)) .... Once Daily 29)  Catapres-Tts-3 0.3 Mg/24hr Ptwk (Clonidine Hcl) .... Q Week 30)  Lomotil 2.5-0.025 Mg Tabs (Diphenoxylate-Atropine) .... As Needed 31)  Meclizine Hcl 12.5 Mg Tabs (Meclizine Hcl) .... Three Times A Day As Needed 32)  Nabumetone 500 Mg Tabs (Nabumetone) .... Once Daily  Allergies: 1)  ! *  Keflex 2)  ! Pcn 3)  ! Pyridium 4)  * Milk Products  Review of Systems      See HPI GI:  Complains of nausea; denies pain on swallowing, indigestion/heartburn, vomiting, change in bowel habits, bloody BM's, and black BMs. GU:  Denies urinary burning and blood in urine.  Vital Signs:  Patient profile:   60 year old female Menstrual status:  hysterectomy Height:      62 inches Weight:      175 pounds BMI:     32.12 Temp:     98.0 degrees F oral Pulse rate:   84 / minute BP sitting:   130 / 70  (left arm) Cuff size:   regular  Vitals Entered By: Cloria Spring LPN (July 07, 2009 10:28 AM)  Physical Exam  General:  Well developed, well nourished, no acute distress. Head:  Normocephalic and atraumatic. Eyes:  Sclera nonicteric. Mouth:  OP moist. Abdomen:  Obese. Soft. Positive BS. Mild diffuse right sided tenderness to deep palpation. No CVA tenderness. No rebound or guarding. No abd bruit or hernia. No HSM or masses.  Extremities:  No clubbing, cyanosis, edema or deformities noted. Neurologic:  Alert and  oriented x4;  grossly normal neurologically. Skin:  Intact without significant lesions or rashes. Psych:  Alert and cooperative. Normal mood and affect.  Impression & Recommendations:  Problem # 1:  DYSPHAGIA UNSPECIFIED (ICD-787.20)  Likely symptoms due to esophageal dysmotility. EM could not be done. BPE did not look like achalasia. Suggest she chew food thoroughly. Remain upright for 30 minutes after meals. Drink plenty of liquids after taking meds and meals. Consider crushing appropriate large pills.   Orders: Est. Patient Level III (16109)  Problem # 2:  LIVER FUNCTION TESTS, ABNORMAL, HX OF (ICD-V12.2)  Resolved.  Orders: Est. Patient Level III (60454)  Problem # 3:  IBS (ICD-564.1)  Stable  Orders: Est. Patient Level III (09811)  Problem # 4:  GERD (ICD-530.81)  Continue Aciphex two times a day.  Orders: Est. Patient Level III (91478)  Problem #  5:  ANEMIA (ICD-285.9)  Chronic microcytic anemia, has been stable.  Heme negative couple of months ago. Last TCS in 2008, Dr. Juanda Chance. EGD 2/11 Dr. Jena Gauss. Will collect another ifobt.    Orders: Est. Patient Level III (29562)  Problem # 6:  GASTROPARESIS (ICD-536.3)  On low-dose Reglan. No vomiting. Some c/o nausea. Given polypharmacy, I do not want to increase dose if we can avoid it.   Orders: Est. Patient Level III (13086)  Appended Document: 6wk fu, gu Please let patient know. Reviewed medications. There are no adjustments of her GI meds for now. She does need to do another ifobt, for persistent microcytic anemia. OV with [facility] as planned.  Appended Document: 6wk fu, gu pt aware, she asked for ifobt be mailed to her because she doesnt have a car and wont be able  to come and pick it up. ifobt in the mail to pt  Appended Document: 6wk fu, gu Follow up appt with [facility] on 07/14/ @10am

## 2010-04-14 NOTE — Assessment & Plan Note (Signed)
Summary: flu shot  Nurse Visit   Allergies: 1)  ! * Keflex 2)  ! Pcn 3)  ! Pyridium 4)  * Milk Products  Immunizations Administered:  Influenza Vaccine # 1:    Vaccine Type: Fluvax MCR    Site: left deltoid    Mfr: novartis    Dose: 0.5 ml    Route: IM    Given by: Adella Hare LPN    Exp. Date: 5/12    Lot #: [id number]    VIS given: 10/05/09 version given November 16, 2009.  Medication Administration  Injection # 1:    Medication: Ketorolac-Toradol 15mg     Diagnosis: BACK PAIN WITH RADICULOPATHY (ICD-729.2)    Route: IM    Site: LUOQ gluteus    Exp Date: 05/12/2011    Lot #: [id number]    Mfr: novaplus    Comments: toradol 60mg  given    Patient tolerated injection without complications    Given by: Adella Hare LPN (November 16, 2009 3:44 PM)  Orders Added: 1)  Ketorolac-Toradol 15mg  [J1885] 2)  Admin of Therapeutic Inj  intramuscular or subcutaneous [96372] 3)  Influenza Vaccine MCR [00025]   Medication Administration  Injection # 1:    Medication: Ketorolac-Toradol 15mg     Diagnosis: BACK PAIN WITH RADICULOPATHY (ICD-729.2)    Route: IM    Site: LUOQ gluteus    Exp Date: 05/12/2011    Lot #: [id number]    Mfr: novaplus    Comments: toradol 60mg  given    Patient tolerated injection without complications    Given by: Adella Hare LPN (November 16, 2009 3:44 PM)  Orders Added: 1)  Ketorolac-Toradol 15mg  [J1885] 2)  Admin of Therapeutic Inj  intramuscular or subcutaneous [96372] 3)  Influenza Vaccine MCR [00025]

## 2010-04-14 NOTE — Progress Notes (Signed)
  Phone Note Call from Patient   Caller: Patient Summary of Call: dr Gildardo Griffes office called stating patient sounded out of it. called patient to check on her, states she hadnt had any sleep and she can not help her speech but that office doesnt understand. patient sounds like herself and sounds alert Initial call taken by: Worthy Keeler LPN,  March 31, 2009 4:14 PM

## 2010-04-14 NOTE — Progress Notes (Signed)
Summary: [facility] GO[facility] HELP  Phone Note Call from Patient   Summary of Call: [facility] IS GOING TO HELP Merideth WITH HER MEDICINES AND TRY TO GET HER A THERAPIST   SAID THANK YOU FOR THE REF Initial call taken by: Lind Guest,  June 22, 2009 4:29 PM  Follow-up for Phone Call        GREAT :) Follow-up by: Adella Hare LPN,  June 22, 2009 4:37 PM

## 2010-04-14 NOTE — Progress Notes (Signed)
Summary: referral  Phone Note Call from Patient   Summary of Call: would like to get a referral to dr. reagen for back pain. phone number [phone] Initial call taken by: Rudene Anda,  December 28, 2009 3:42 PM  Follow-up for Phone Call        what type of doc is this i needinfo before i ok Follow-up by: Syliva Overman MD,  December 29, 2009 12:26 PM  Additional Follow-up for Phone Call Additional follow up Details #1::        Dr Lodema Hong, this is an orthopedic doctor. Additional Follow-up by: Curtis Sites,  January 04, 2010 11:15 AM    Additional Follow-up for Phone Call Additional follow up Details #2::    ok to refer pt to ortho of herchoice eval back pain, she has been to many others in the past not sure they can help her Follow-up by: Syliva Overman MD,  January 04, 2010 5:15 PM  Additional Follow-up for Phone Call Additional follow up Details #3:: Details for Additional Follow-up Action Taken: Patient has an appt. with Dr. Shon Baton on the 11th of November at 11 they are located at [facility] in the building with [employer] 2nd floor.  Patient is aware.   Additional Follow-up by: Curtis Sites,  January 05, 2010 4:16 PM  noted, thanks

## 2010-04-14 NOTE — Progress Notes (Signed)
Summary: back and knees  Phone Note Call from Patient   Summary of Call: having trouble with her knees  and back please call her back at [phone] Initial call taken by: Lind Guest,  March 15, 2009 8:21 AM  Follow-up for Phone Call        returned call, left message Follow-up by: Worthy Keeler LPN,  March 16, 2009 10:08 AM  Additional Follow-up for Phone Call Additional follow up Details #1::        patient states she is having injections with dr Eduard Clos today  has had some trouble with ear but has been seen at er and treated for this  very unclear what patient wanted although i repeatedly asked patient  patient seemed to have difficulty saying what she needed to say   patient has appt here this weak Additional Follow-up by: Worthy Keeler LPN,  March 16, 2009 10:51 AM    Additional Follow-up for Phone Call Additional follow up Details #2::    noted, she seems to be receiving carefor her curren probs Follow-up by: Syliva Overman MD,  March 16, 2009 12:19 PM

## 2010-04-14 NOTE — Letter (Signed)
Summary: WHEELCHAIR  WHEELCHAIR   Imported By: Lind Guest 05/04/2009 15:57:30  _____________________________________________________________________  External Attachment:    Type:   Image     Comment:   External Document

## 2010-04-14 NOTE — Progress Notes (Signed)
Summary: LEG SWOLLEN  Phone Note Call from Patient   Summary of Call: LEFT KNEE LEG SWOLLEN AND KNEE ACHES TIGHT LIKE  TINGLE  CALL HER BACK AT [phone] Initial call taken by: Lind Guest,  March 25, 2009 10:50 AM  Follow-up for Phone Call        Left knee is swollen, from the ankle all the way up to her knee, its tight and achy. Hasn't told anybody because she didn't want to mess up her living at her apartment, Tells me she has fallen a couple times. Doesn't know why her leg is swollen, if it was from one of those falls or what. And what does she need to do. Has been keeping it elevated. Advised ED eval. She said when her aid gets there she will Follow-up by: Everitt Amber,  March 25, 2009 11:37 AM     Appended Document: LEG SWOLLEN Ticia called back talking about the leg again saying that I never called her back and I reminded her that I and Dr. Lodema Hong had advised the ER and she said "Oh that's right" She went on to say that her aid wasn't with her long today and she couldn't go. Breniya kept talking about leg and breast and couldn't make out alot of what she was saying, finally I got the aide on the phone and she looked at Shanique's breast and said that the bruising was gone and she looked at both her legs and I asked her to note any swelling or redness and she said that both legs looked the same but her calfs were somewhat large in both legs. I explained to the aide that Dr. Lodema Hong had told Miko to be evaluated by the ER if she felt she was experiencing swelling and tingling. The aide agreed and I told her there was nothing else I could recommend other than that

## 2010-04-14 NOTE — Progress Notes (Signed)
Summary: TEST RESULTS  Phone Note Call from Patient   Summary of Call: CALL HER WHEN TEST RESULTS COME IN FROM HER TEST SHE DID TODAY Initial call taken by: Lind Guest,  April 05, 2010 1:52 PM  Follow-up for Phone Call        patient aware [name] normal Follow-up by: Adella Hare LPN,  April 07, 2010 11:43 AM

## 2010-04-14 NOTE — Letter (Signed)
Summary: consults chart 1  consults chart 1   Imported By: Lind Guest 11/12/2009 08:18:29  _____________________________________________________________________  External Attachment:    Type:   Image     Comment:   External Document

## 2010-04-14 NOTE — Progress Notes (Signed)
Summary: SCOOTER  Phone Note Call from Patient   Summary of Call: NO THANK YOU FOR THE SCOOTER AND SHE LOVES YOU Initial call taken by: Lind Guest,  May 03, 2009 11:13 AM  Follow-up for Phone Call        noted, I am writing a script for a wheelchair, which can be faxed in today, let her know and fax pls Follow-up by: Syliva Overman MD,  May 03, 2009 12:30 PM  Additional Follow-up for Phone Call Additional follow up Details #1::        rx sent, patient aware Additional Follow-up by: Adella Hare LPN,  May 03, 2009 4:07 PM

## 2010-04-14 NOTE — Progress Notes (Signed)
  Phone Note Call from Patient   Summary of Call: a lamp fell over the weekend and hit her in the head and left side of neck. She has a bruise and knot and wanted an ultrasound done. I advised her to make app but she did not have a ride. She was concerned about it so I told her she could go to the ER but she just wanted to make app for tomorrow so I put her through to scheduling Initial call taken by: Everitt Amber LPN,  August 24, 2009 3:01 PM

## 2010-04-14 NOTE — Progress Notes (Signed)
Summary: referral  Phone Note Call from Patient   Caller: Patient Summary of Call: painful swelling of right breast following fall last week Initial call taken by: Syliva Overman MD,  March 23, 2009 12:46 PM  Follow-up for Phone Call        pls order [name] of right breast to eval swelling s/p fall, call and give her appt info pls Follow-up by: Syliva Overman MD,  March 23, 2009 12:46 PM  Additional Follow-up for Phone Call Additional follow up Details #1::        pt has been referred to aph for 03/31/2009. pt notified and aware Additional Follow-up by: Rudene Anda,  March 24, 2009 1:57 PM

## 2010-04-14 NOTE — Progress Notes (Signed)
Summary: ct abdomen  Phone Note Call from Patient   Summary of Call: on the 24 th does she go to [facility] and does she still want her to go do ct of her lower abdomen since she had the other tests  left  message call back at [phone] Initial call taken by: Lind Guest,  April 01, 2010 10:45 AM  Follow-up for Phone Call        pt was called and told she ahad already done dt of abd. She was being referred for a ultra sound Follow-up by: Rudene Anda,  April 01, 2010 1:44 PM

## 2010-04-14 NOTE — Assessment & Plan Note (Signed)
Summary: blood sugars   Vital Signs:  Patient profile:   60 year old female Menstrual status:  hysterectomy Height:      62 inches Weight:      181.25 pounds BMI:     33.27 O2 Sat:      95 % Pulse rate:   87 / minute Pulse rhythm:   regular Resp:     16 per minute BP sitting:   130 / 70  (right arm)  Vitals Entered By: Everitt Amber LPN (Jul 13, 1608 9:41 AM)  Nutrition Counseling: Patient's BMI is greater than 25 and therefore counseled on weight management options. CC: sugars have been in the 400's and have been too high to read and she went tothe ER twice and this morning fasting, its 244 still   Primary Care Provider:  Lodema Hong  CC:  sugars have been in the 400's and have been too high to read and she went tothe ER twice and this morning fasting and its 244 still.  History of Present Illness: Thept is in because of uncontrolled blood sugars in the past several; weeks , despite the fact that she has had no recent steropid bursts. She is unaware of any majorchange in her eating habits. She also c/o uncontrolled back pain, burning in the feet, gait instability and weakness in her hands. She is not deemed a surgical canmdidate for back pain reportedly. She denies uncontrolled depression or anxiety, and reports being extremely happy where she currently resides.   Allergies: 1)  ! * Keflex 2)  ! Pcn 3)  ! Pyridium 4)  * Milk Products  Review of Systems      See HPI General:  Complains of fatigue and weakness; denies chills and fever. Eyes:  Denies blurring, discharge, eye pain, and red eye. ENT:  Denies hoarseness, nasal congestion, postnasal drainage, sinus pressure, and sore throat. CV:  Complains of difficulty breathing while lying down and swelling of feet; denies chest pain or discomfort and palpitations; excessive abdominal girth, asking about surgery for this, i advised lifestyle change at this time. Resp:  Complains of shortness of breath; denies cough and sputum  productive. GI:  Denies abdominal pain, constipation, diarrhea, nausea, and vomiting; reports severe gastroparesiss which is being followed by gI. GU:  Denies dysuria and urinary frequency. MS:  Complains of joint pain, low back pain, mid back pain, muscle weakness, and stiffness. Derm:  Denies lesion(s) and rash. Neuro:  Complains of falling down, headaches, poor balance, and weakness; c/o weakness in both hands. c/o persistent and uncontrolled back pain, states she was told she needed surgery but was a poor candidate based on her other health probs, not interested in seeing pain doc at this time. Psych:  Complains of anxiety, depression, and mental problems; denies easily tearful, irritability, suicidal thoughts/plans, thoughts of violence, and unusual visions or sounds; imoproved since living independently. Endo:  Complains of excessive thirst and excessive urination; uncontrolled blood sugars x 2 weeks often over 400. Heme:  Denies abnormal bruising and bleeding. Allergy:  Complains of seasonal allergies.  Physical Exam  General:  Well-developed,well-nourished,in no acute distress; alert,appropriate and cooperative throughout examination HEENT: No facial asymmetry,  EOMI, No sinus tenderness, TM's Clear, oropharynx  pink and moist.   Chest: Clear to auscultation bilaterally.  CVS: S1, S2, No murmurs, No S3.   Abd: Soft, Nontender.  RU:[id number]  ROM spine,adequate in  hips, shoulders and knees.  Ext: No edema.   CNS: CN 2-12 intact,reduced  power  tone and sensation in lower extremeties, reduced power in the hands with a grade 3 grip.  Skin: Intact, no visible lesions or rashes.  Psych: Good eye contact, normal affect.  Memory intact, not anxious or depressed appearing.   Diabetes Management Exam:    Foot Exam (with socks and/or shoes not present):       Sensory-Monofilament:          Left foot: diminished          Right foot: diminished       Inspection:          Right foot:  normal       Nails:          Left foot: normal          Right foot: normal   Impression & Recommendations:  Problem # 1:  GASTROPARESIS (ICD-536.3) Assessment Deteriorated followed by gI  Problem # 2:  HYPERLIPIDEMIA (ICD-272.4) Assessment: Comment Only  Her updated medication list for this problem includes:    Crestor 10 Mg Tabs (Rosuvastatin calcium) .[name]... Take 1 tab by mouth at bedtime  Orders: T-Hepatic Function [phone]) T-Lipid Profile [phone])  Labs Reviewed: SGOT: 18 (03/18/2009)   SGPT: 17 (03/18/2009)   HDL:46 (03/18/2009), 46 (12/28/2008)  LDL:69 (03/18/2009), 106 (84/69/6295)  Chol:135 (03/18/2009), 190 (12/28/2008)  Trig:101 (03/18/2009), 192 (12/28/2008)  Problem # 3:  BACK PAIN WITH RADICULOPATHY (ICD-729.2) Assessment: Deteriorated  Orders: Physical Therapy Referral (PT)  for a wower wheelchair  Problem # 4:  PSYCHOTIC D/O W/HALLUCINATIONS CONDS CLASS ELSW (ICD-293.82) Assessment: Improved  Problem # 5:  OBESITY (ICD-278.00) Assessment: Deteriorated  Ht: 62 (07/13/2009)   Wt: 181.25 (07/13/2009)   BMI: 33.27 (07/13/2009)  Problem # 6:  HYPERTENSION (ICD-401.9) Assessment: Unchanged  The following medications were removed from the medication list:    Catapres-tts-3 0.3 Mg/24hr Ptwk (Clonidine hcl) ..... Q week Her updated medication list for this problem includes:    Clonidine Hcl 0.2 Mg Tabs (Clonidine hcl) .[name]... Take 1 tablet by mouth two times a day    Metoprolol Tartrate 25 Mg Tabs (Metoprolol tartrate) .[name]... Take 1 tablet by mouth two times a day    Amlodipine Besy-benazepril Hcl 5-40 Mg Caps (Amlodipine besy-benazepril hcl) .[name]... Take 1 tablet by mouth once a day  Prior BP: 130/70 (07/07/2009)  Labs Reviewed: K+: 4.7 (06/08/2009) Creat: : 0.73 (06/08/2009)   Chol: 135 (03/18/2009)   HDL: 46 (03/18/2009)   LDL: 69 (03/18/2009)   TG: 101 (03/18/2009)  Problem # 7:  DIABETES MELLITUS (ICD-250.00) Assessment: Deteriorated  The  following medications were removed from the medication list:    Glipizide 5 Mg Tabs (Glipizide) ..... One tablet in the morning at breakfast, and half in the evening at supper Her updated medication list for this problem includes:    Amlodipine Besy-benazepril Hcl 5-40 Mg Caps (Amlodipine besy-benazepril hcl) .[name]... Take 1 tablet by mouth once a day    Glipizide 5 Mg Tabs (Glipizide) ..... One and a half in the morning and one at suppertime  Orders: Glucose, (CBG) (82962) T- Hemoglobin A1C ([id number]) Insulin 5 units (U2725) Admin of Therapeutic Inj  intramuscular or subcutaneous (36644)  Labs Reviewed: Creat: 0.73 (06/08/2009)    Reviewed HgBA1c results: 7.4 (06/08/2009)  6.6 (03/18/2009)  Complete Medication List: 1)  Singulair 10 Mg Tabs (Montelukast sodium) .... One tab by mouth once daily 2)  Zoloft 100 Mg Tabs (Sertraline hcl) .... Two tabs by mouth once daily 3)  Evoxac 30 Mg Caps (Cevimeline hcl) .Marland KitchenMarland KitchenMarland Kitchen  One cap by mouth tid 4)  Mirtazapine 30 Mg Tabs (mirtazapine)  .... Take 1 tab by mouth at bedtime 5)  Voltaren 1 % Gel (Diclofenac sodium) .... Apply twice daily as needed to affected areas 6)  Aciphex 20 Mg Tbec (Rabeprazole sodium) .... One by mouth two times a day 30 mins before a meal (breakfast and evening meal) 7)  Levoxyl 50 Mcg Tabs (Levothyroxine sodium) .... Take 1 tablet by mouth once a day 8)  Gabapentin 300 Mg Caps (Gabapentin) .... Take 1 capsule by mouth three times a day 9)  Clonidine Hcl 0.2 Mg Tabs (Clonidine hcl) .... Take 1 tablet by mouth two times a day 10)  Metoprolol Tartrate 25 Mg Tabs (Metoprolol tartrate) .... Take 1 tablet by mouth two times a day 11)  Vitamin D (ergocalciferol) 50000 Unit Caps (Ergocalciferol) .... One cap by mouth q week 12)  Crestor 10 Mg Tabs (Rosuvastatin calcium) .... Take 1 tab by mouth at bedtime 13)  Lamictal 100 Mg Tabs (Lamotrigine) .... One by mouth daily 14)  Amlodipine Besy-benazepril Hcl 5-40 Mg Caps (Amlodipine  besy-benazepril hcl) .... Take 1 tablet by mouth once a day 15)  Trazodone Hcl 100 Mg Tabs (Trazodone hcl) .... Take 1 tab by mouth at bedtime 16)  Ativan 1 Mg Tabs (Lorazepam) .... Take 1 tablet by mouth four times a day 17)  Doxepin Hcl 50 Mg Caps (Doxepin hcl) .... Take 1 tablet by mouth three times a day 18)  Metoclopramide Hcl 5 Mg Tabs (Metoclopramide hcl) .... One by mouth qach and qhs 19)  Flexeril 10 Mg Tabs (Cyclobenzaprine hcl) .... Take 1 tablet by mouth two times a day 20)  Hydrocodone-acetaminophen 5-500 Mg Tabs (Hydrocodone-acetaminophen) .... Take 1 tablet by mouth two times a day 21)  Proair Hfa 108 (90 Base) Mcg/act Aers (Albuterol sulfate) .... 2 puffs at bedtime 22)  Carafate 1 Gm/82ml Susp (Sucralfate) .... 2 teaspoons (10ml) qid 23)  Flonase 50 Mcg/act Susp (Fluticasone propionate) .... 2 sprays once daily 24)  Nystop 100000 Unit/gm Powd (Nystatin) .... Two times a day 25)  Plavix 75 Mg Tabs (Clopidogrel bisulfate) .... Once daily 26)  Miacalcin 200 Unit/ml Soln (Calcitonin (salmon)) .... Once daily 27)  Lomotil 2.5-0.025 Mg Tabs (Diphenoxylate-atropine) .... As needed 28)  Meclizine Hcl 12.5 Mg Tabs (Meclizine hcl) .... Three times a day as needed 29)  Nabumetone 500 Mg Tabs (Nabumetone) .... Once daily 30)  Glipizide 5 Mg Tabs (Glipizide) .... One and a half in the morning and one at suppertime  Other Orders: T-Basic Metabolic Panel [phone])  Patient Instructions: 1)  f/u the first week in July. 2)  BMP prior to visit, ICD-9: 3)  Hepatic Panel prior to visit, ICD-9: fasting end of June or early July 4)  Lipid Panel prior to visit, ICD-9: 5)  HbgA1C prior to visit, ICD-9: 6)  you will have an increase in your blood sugar meds. 7)  your blood pressure is great. 8)  You will be referred for  evaluation for a power wheelchair. 9)  you will get insulin in the office today Prescriptions: GLIPIZIDE 5 MG TABS (GLIPIZIDE) one and a half in the morning and one at  suppertime  #75 x 2   Entered and Authorized by:   Syliva Overman MD   Signed by:   Syliva Overman MD on 07/13/2009   Method used:   Printed then faxed to ...       [facility] (retail)  [address] [place]  [address]       Ph: [phone]       Fax: [phone]   RxID:   [id number]   Laboratory Results   Blood Tests     Glucose (fasting): 244 mg/dL   (Normal Range: 84-132)         Medication Administration  Injection # 1:    Medication: Insulin 5 units    Diagnosis: DIABETES MELLITUS (ICD-250.00)    Route: SQ    Site: R deltoid    Exp Date: 04/2011    Lot #: [id number]    Mfr: nordisk     Comments: 3 units    Patient tolerated injection without complications    Given by: Everitt Amber LPN (Jul 14, 2534 10:42 AM)  Orders Added: 1)  Glucose, (CBG) [82962] 2)  Est. Patient Level IV [64403] 3)  T-Basic Metabolic Panel [80048-22910] 4)  T-Hepatic Function [80076-22960] 5)  T-Lipid Profile [80061-22930] 6)  T- Hemoglobin A1C [83036-23375] 7)  Insulin 5 units [J1815] 8)  Admin of Therapeutic Inj  intramuscular or subcutaneous [96372] 9)  Physical Therapy Referral [PT]

## 2010-04-14 NOTE — Progress Notes (Signed)
  Phone Note Call from Patient   Summary of Call: Crystal from home health just took [name]'s blood sugar and it was too high to read. She wants to know what to do. Was in ER yesterday but no med changes were made and she's coming in the morning Initial call taken by: Everitt Amber LPN,  Jul 12, 1608 3:24 PM  Follow-up for Phone Call        if too high to read she needs to go back to the Ed, I cannot treat sucha high number that i do not know what it is Follow-up by: Syliva Overman MD,  Jul 12, 2009 3:51 PM  Additional Follow-up for Phone Call Additional follow up Details #1::        Caregiver already aware to send patient to ER. Additional Follow-up by: Everitt Amber LPN,  Jul 13, 9602 3:56 PM

## 2010-04-14 NOTE — Progress Notes (Signed)
----   Converted from flag ---- ---- 04/19/2009 8:23 AM, Ave Filter wrote: She didn't say.Marland KitchenMarland KitchenShe just said she can't take Iron. ------------------------------  Let's just wait until her egd is done.

## 2010-04-14 NOTE — Miscellaneous (Signed)
Summary: Home Care Report  Home Care Report   Imported By: Lind Guest 06/30/2009 14:57:20  _____________________________________________________________________  External Attachment:    Type:   Image     Comment:   External Document

## 2010-04-14 NOTE — Progress Notes (Signed)
  Phone Note From Pharmacy   Caller: [facility] Summary of Call: requesting refill on hydrocodone 5/500 two times a day  Initial call taken by: Adella Hare LPN,  January 28, 2010 3:34 PM  Follow-up for Phone Call        pls refill x 3 if due Follow-up by: Syliva Overman MD,  January 30, 2010 2:52 PM  Additional Follow-up for Phone Call Additional follow up Details #1::        Prescription resent Additional Follow-up by: Adella Hare LPN,  February 02, 2010 2:07 PM    New/Updated Medications: HYDROCODONE-ACETAMINOPHEN 5-500 MG TABS (HYDROCODONE-ACETAMINOPHEN) one tab by mouth two times a day Prescriptions: HYDROCODONE-ACETAMINOPHEN 5-500 MG TABS (HYDROCODONE-ACETAMINOPHEN) one tab by mouth two times a day  #60 x 1   Entered by:   Adella Hare LPN   Authorized by:   Syliva Overman MD   Signed by:   Adella Hare LPN on 82/95/6213   Method used:   Printed then faxed to ...       [facility] (retail)       [address]       [place], [place]  [address]       Ph: [phone]       Fax: [phone]   RxID:   [id number]

## 2010-04-14 NOTE — Letter (Signed)
Summary: [facility]   Imported By: Lind Guest 12/03/2009 08:37:07  _____________________________________________________________________  External Attachment:    Type:   Image     Comment:   External Document

## 2010-04-14 NOTE — Assessment & Plan Note (Signed)
Summary: ear ache- room 1   Vital Signs:  Patient profile:   60 year old female Menstrual status:  hysterectomy Height:      62 inches Weight:      174 pounds BMI:     31.94 Temp:     98.9 degrees F oral Pulse rate:   113 / minute Resp:     16 per minute BP sitting:   180 / 70  (left arm)  Vitals Entered By: Adella Hare LPN (October 18, 2009 1:27 PM) CC: left ear ache Is Patient Diabetic? Yes Did you bring your meter with you today? No Comments did not bring meds to ov   Referring Provider:  Dr. Lodema Hong Primary Provider:  simpson  CC:  left ear ache.  History of Present Illness: [name] presents today with c/o Lt ear pain, onset Sat 10/16/09.  She spoke to the pharmacist, and has been using sweet oil which provides some temporary relief. She states the pain is below her ear, sharp shooting, and goes up into her ear and head. Worse this afternoon with eating a sandwhich.  No fever but has had chills. She has had some nasal congestion, but states she gets this with air conditioning. Also a little nonprod cough.  Pt states she was seen by her cardiologist this am.  He increased some of her medications because of her BP being high. Pt is uncertain which med or the current dose.    Allergies (verified): 1)  ! * Keflex 2)  ! Pcn 3)  ! Pyridium 4)  * Milk Products  Past History:  Past medical history reviewed for relevance to current acute and chronic problems.  Past Medical History: Reviewed history from 02/22/2009 and no changes required. Bipolar d/o CVA H/O pancreatitis due to Depakote with normal EUS in 2006 OSTEOPOROSIS (ICD-733.00) SLEEP APNEA (ICD-780.57) BACK PAIN, CHRONIC (ICD-724.5) NAUSEA, CHRONIC (ICD-787.02) DIABETES MELLITUS (ICD-250.00) TRIGGER FINGER (ICD-727.3) Anxiety Disorder Hypertension Migraines Simple adenomas and hyperplastic polyps on TCS in 2001 Last TCS was in 9/08 by Dr. Lina Sar for diarrhea.  Bx negative for microscopic colitits.  She  had diverticulsis.   Glaucoma  Review of Systems General:  Complains of chills; denies fever. ENT:  Complains of earache and nasal congestion; denies difficulty swallowing, ear discharge, sinus pressure, and sore throat. CV:  Denies chest pain or discomfort. Resp:  Complains of cough; denies shortness of breath and sputum productive.  Physical Exam  General:  Well-developed,well-nourished,in no acute distress; alert,appropriate and cooperative throughout examination Head:  Normocephalic and atraumatic without obvious abnormalities. No apparent alopecia or balding. Ears:  External ear exam shows no significant lesions or deformities.  Otoscopic examination reveals clear canals, tympanic membranes are intact bilaterally without bulging, retraction, inflammation or discharge. Hearing is grossly normal bilaterally. Nose:  External nasal examination shows no deformity or inflammation. Nasal mucosa are pink and moist without lesions or exudates. Mouth:  Oral mucosa and oropharynx without lesions or exudates.   Neck:  No deformities, masses, or tenderness noted. Lungs:  Normal respiratory effort, chest expands symmetrically. Lungs are clear to auscultation, no crackles or wheezes. Heart:  Normal rate and regular rhythm. S1 and S2 normal without gallop, murmur, click, rub or other extra sounds. Msk:  Lt TMJ nontender. Cervical Nodes:  tender < 1 cm smooth Lt anterior cervical node, near angle of jaw Psych:  normally interactive and good eye contact.     Impression & Recommendations:  Problem # 1:  CERVICAL LYMPHADENOPATHY, LEFT (ICD-785.6)  Assessment New I believe this is the cause of her ear pain.  TM & EAC are neg, and TMJ is nontender.  Her updated medication list for this problem includes:    Doxycycline Hyclate 100 Mg Caps (Doxycycline hyclate) .[name]... Take 1 two times a day x 10 days  Problem # 2:  HYPERTENSION (ICD-401.9) Assessment: Comment Only Pt states her meds were changed earler  today by her cardiologist.  Her updated medication list for this problem includes:    Metoprolol Tartrate 25 Mg Tabs (Metoprolol tartrate) .[name]... Take 1 tablet by mouth two times a day    Amlodipine Besy-benazepril Hcl 5-40 Mg Caps (Amlodipine besy-benazepril hcl) .[name]... Take 1 tablet by mouth once a day    Clonidine Hcl 0.2 Mg Tabs (Clonidine hcl) ..... One tablet in the morning and one and a half in the evening    Furosemide 20 Mg Tabs (Furosemide) ..... Once daily as needed  BP today: 180/70 Prior BP: 118/72 (09/23/2009)  Labs Reviewed: K+: 4.3 (09/14/2009) Creat: : 0.67 (09/14/2009)   Chol: 158 (09/14/2009)   HDL: 43 (09/14/2009)   LDL: 73 (09/14/2009)   TG: 211 (09/14/2009)  Complete Medication List: 1)  Singulair 10 Mg Tabs (Montelukast sodium) .... One tab by mouth once daily 2)  Zoloft 100 Mg Tabs (Sertraline hcl) .... Two tabs by mouth once daily 3)  Evoxac 30 Mg Caps (Cevimeline hcl) .... One cap by mouth tid 4)  Mirtazapine 30 Mg Tabs (mirtazapine)  .... Take 1 tab by mouth at bedtime 5)  Levoxyl 50 Mcg Tabs (Levothyroxine sodium) .... Take 1 tablet by mouth once a day 6)  Gabapentin 300 Mg Caps (Gabapentin) .... Take 1 capsule by mouth three times a day 7)  Metoprolol Tartrate 25 Mg Tabs (Metoprolol tartrate) .... Take 1 tablet by mouth two times a day 8)  Crestor 10 Mg Tabs (Rosuvastatin calcium) .... Take 1 tab by mouth at bedtime 9)  Lamictal 100 Mg Tabs (Lamotrigine) .... One by mouth daily 10)  Amlodipine Besy-benazepril Hcl 5-40 Mg Caps (Amlodipine besy-benazepril hcl) .... Take 1 tablet by mouth once a day 11)  Trazodone Hcl 100 Mg Tabs (Trazodone hcl) .... Take 1 tab by mouth at bedtime 12)  Ativan 1 Mg Tabs (Lorazepam) .... Take 1 tablet by mouth four times a day 13)  Metoclopramide Hcl 5 Mg Tabs (Metoclopramide hcl) .... One by mouth qach and qhs 14)  Flexeril 10 Mg Tabs (Cyclobenzaprine hcl) .... Take 1 tablet by mouth two times a day 15)  Hydrocodone-acetaminophen  5-500 Mg Tabs (Hydrocodone-acetaminophen) .... Take 1 tablet by mouth two times a day 16)  Plavix 75 Mg Tabs (Clopidogrel bisulfate) .... Once daily 17)  Miacalcin 200 Unit/ml Soln (Calcitonin (salmon)) .... Once daily 18)  Lomotil 2.5-0.025 Mg Tabs (Diphenoxylate-atropine) .... As needed 19)  Glipizide 10 Mg Tabs (Glipizide) .... Take 1 tablet by mouth two times a day 20)  Zofran 4 Mg Tabs (Ondansetron hcl) .... Take 1 tablet by mouth once a day as needed severe nausea 21)  Clonidine Hcl 0.2 Mg Tabs (Clonidine hcl) .... One tablet in the morning and one and a half in the evening 22)  Onglyza 5 Mg Tabs (Saxagliptin hcl) .... Take 1 tablet by mouth once a day 23)  Tessalon Perles 100 Mg Caps (Benzonatate) .... One cap by mouth three times a day 24)  Flonase 50 Mcg/act Susp (Fluticasone propionate) .... Once daily 25)  Aciphex 20 Mg Tbec (Rabeprazole sodium) .... Once daily 26)  Nystop 100000 Unit/gm Powd (Nystatin) .... Two times a day 27)  Meclizine Hcl 12.5 Mg Tabs (Meclizine hcl) .... As needed 28)  Furosemide 20 Mg Tabs (Furosemide) .... Once daily as needed 29)  Klor-con M20 20 Meq Cr-tabs (Potassium chloride crys cr) .... As needed with furosemide 30)  Vitamin D 50000u  .... Q week 31)  Doxycycline Hyclate 100 Mg Caps (Doxycycline hyclate) .... Take 1 two times a day x 10 days  Patient Instructions: 1)  Keep your next appt with Dr Lodema Hong. 2)  I have prescribed an antibiotic for you. 3)  You may try warm or cool compresses to help with discomfort.  Use whichever gives you the greatest relief. 4)  You are already taking Hydrocodone for pain.   Prescriptions: DOXYCYCLINE HYCLATE 100 MG CAPS (DOXYCYCLINE HYCLATE) take 1 two times a day x 10 days  #20 x 0   Entered and Authorized by:   [name] Sheets PA   Signed by:   [name] Sheets PA on 10/18/2009   Method used:   Electronically to        [facility] (retail)       [address]       [place],  [place]  [address]       Ph: [phone]       Fax: [phone]   RxID:   [id number]

## 2010-04-14 NOTE — Progress Notes (Signed)
Summary: speak with nurse  Phone Note Call from Patient   Summary of Call: would like for Jamie to call her nback about taking some med for dizziness. [phone] Initial call taken by: Rudene Anda,  November 10, 2009 4:22 PM  Follow-up for Phone Call        returned call, # disconnected Follow-up by: Adella Hare LPN,  November 11, 2009 4:26 PM

## 2010-04-14 NOTE — Progress Notes (Signed)
Summary: medications questions  Phone Note Call from Patient Call back at Home Phone [phone]   Caller: Patient Summary of Call: pt called- she wants to reduce the number of meds she is on. She is taking reglan ac and hs and Dr. Lodema Hong has her on Aciphex two times a day. pt wants to know if it would be ok to stop one of these. please advise Initial call taken by: Hendricks Limes LPN,  May 27, 2009 10:02 AM     Appended Document: medications questions Doubt she can get by by stopping either completely; could try decreasing Achiphex to once daily; f/u as scheduled  Appended Document: medications questions pharmacy aware

## 2010-04-14 NOTE — Assessment & Plan Note (Signed)
Summary: knot on neck and somting in throat- room 2   Vital Signs:  Patient profile:   60 year old female Menstrual status:  hysterectomy Height:      62 inches Weight:      175 pounds BMI:     32.12 O2 Sat:      96 % on Room air Pulse rate:   96 / minute Resp:     16 per minute BP sitting:   120 / 60  (left arm)  Vitals Entered By: Adella Hare LPN (September 03, 2009 10:37 AM) CC: knot on neck and somthing in throat Is Patient Diabetic? No Pain Assessment Patient in pain? no      Comments did not bring meds to ov   Referring Provider:  Dr. Lodema Hong Primary Provider:  Lodema Hong  CC:  knot on neck and somthing in throat.  History of Present Illness: Pt is here today with c/o increased sinus congestion and post nasal drainage x 1 week.  She is coughing up thick phlegm from the drainage.  It is making her throat feel irritated.  She had a tender lump "like a bruise" in her Lt neck x couple days but seems better now.  Has had the chills too.  Pt has seen GI for dysphagia and nausea.  She still is c/o nausea.   Allergies (verified): 1)  ! * Keflex 2)  ! Pcn 3)  ! Pyridium 4)  * Milk Products  Past History:  Past medical history reviewed for relevance to current acute and chronic problems.  Past Medical History: Reviewed history from 02/22/2009 and no changes required. Bipolar d/o CVA H/O pancreatitis due to Depakote with normal EUS in 2006 OSTEOPOROSIS (ICD-733.00) SLEEP APNEA (ICD-780.57) BACK PAIN, CHRONIC (ICD-724.5) NAUSEA, CHRONIC (ICD-787.02) DIABETES MELLITUS (ICD-250.00) TRIGGER FINGER (ICD-727.3) Anxiety Disorder Hypertension Migraines Simple adenomas and hyperplastic polyps on TCS in 2001 Last TCS was in 9/08 by Dr. Lina Sar for diarrhea.  Bx negative for microscopic colitits.  She had diverticulsis.   Glaucoma  Review of Systems General:  Complains of chills; denies fever. ENT:  Complains of nasal congestion and postnasal drainage; denies earache,  sinus pressure, and sore throat. CV:  Denies chest pain or discomfort. Resp:  Complains of cough and sputum productive; denies shortness of breath. GI:  Complains of nausea; denies abdominal pain and vomiting.  Physical Exam  General:  Well-developed,well-nourished,in no acute distress; alert,appropriate and cooperative throughout examination Head:  Normocephalic and atraumatic without obvious abnormalities. No apparent alopecia or balding. Ears:  External ear exam shows no significant lesions or deformities.  Otoscopic examination reveals clear canals, tympanic membranes are intact bilaterally without bulging, retraction, inflammation or discharge. Hearing is grossly normal bilaterally. Nose:  External nasal examination shows no deformity or inflammation. Nasal mucosa are pink and moist without lesions or exudates.no sinus percussion tenderness.   Mouth:  Oral mucosa and oropharynx without lesions or exudates.  Neck:  No deformities, masses, or tenderness noted. Lungs:  Normal respiratory effort, chest expands symmetrically. Lungs are clear to auscultation, no crackles or wheezes. Heart:  Normal rate and regular rhythm. S1 and S2 normal without gallop, murmur, click, rub or other extra sounds. Cervical Nodes:  1 shotty nontender Lt ant cerv node noted Psych:  normally interactive and good eye contact.     Impression & Recommendations:  Problem # 1:  SINUSITIS, ACUTE (ICD-461.9) Assessment New  Her updated medication list for this problem includes:    Flonase 50 Mcg/act Susp (Fluticasone propionate) .Marland KitchenMarland KitchenMarland KitchenMarland Kitchen  2 sprays once daily    Doxycycline Hyclate 100 Mg Caps (Doxycycline hyclate) .[name]... Take 1 two times a day x 10 days    Benzonatate 100 Mg Caps (Benzonatate) .[name]... Take 1 every 8 hrs as needed for cough  Instructed on treatment. Call if symptoms persist or worsen.   Problem # 2:  NAUSEA (ICD-787.02) Assessment: Comment Only Advised pt she needs to f/u with GI regarding this since  they have been treating.  Her updated medication list for this problem includes:    Meclizine Hcl 12.5 Mg Tabs (Meclizine hcl) .[name]... Three times a day as needed  Problem # 3:  HYPERTENSION (ICD-401.9) Assessment: Improved  Her updated medication list for this problem includes:    Clonidine Hcl 0.2 Mg Tabs (Clonidine hcl) .[name]... Take 1 tablet by mouth two times a day    Metoprolol Tartrate 25 Mg Tabs (Metoprolol tartrate) .[name]... Take 1 tablet by mouth two times a day    Amlodipine Besy-benazepril Hcl 5-40 Mg Caps (Amlodipine besy-benazepril hcl) .[name]... Take 1 tablet by mouth once a day    Furosemide 20 Mg Tabs (Furosemide) .[name]... Take 1 tablet by mouth once a day as needed  BP today: 120/60 Prior BP: 130/70 (07/13/2009)  Labs Reviewed: K+: 4.7 (06/08/2009) Creat: : 0.73 (06/08/2009)   Chol: 135 (03/18/2009)   HDL: 46 (03/18/2009)   LDL: 69 (03/18/2009)   TG: 101 (03/18/2009)  Complete Medication List: 1)  Singulair 10 Mg Tabs (Montelukast sodium) .... One tab by mouth once daily 2)  Zoloft 100 Mg Tabs (Sertraline hcl) .... Two tabs by mouth once daily 3)  Evoxac 30 Mg Caps (Cevimeline hcl) .... One cap by mouth tid 4)  Mirtazapine 30 Mg Tabs (mirtazapine)  .... Take 1 tab by mouth at bedtime 5)  Voltaren 1 % Gel (Diclofenac sodium) .... Apply twice daily as needed to affected areas 6)  Aciphex 20 Mg Tbec (Rabeprazole sodium) .... One by mouth 30 mins before breakfast daily 7)  Levoxyl 50 Mcg Tabs (Levothyroxine sodium) .... Take 1 tablet by mouth once a day 8)  Gabapentin 300 Mg Caps (Gabapentin) .... Take 1 capsule by mouth three times a day 9)  Clonidine Hcl 0.2 Mg Tabs (Clonidine hcl) .... Take 1 tablet by mouth two times a day 10)  Metoprolol Tartrate 25 Mg Tabs (Metoprolol tartrate) .... Take 1 tablet by mouth two times a day 11)  Vitamin D (ergocalciferol) 50000 Unit Caps (Ergocalciferol) .... One cap by mouth q week 12)  Crestor 10 Mg Tabs (Rosuvastatin calcium) .... Take 1 tab by  mouth at bedtime 13)  Lamictal 100 Mg Tabs (Lamotrigine) .... One by mouth daily 14)  Amlodipine Besy-benazepril Hcl 5-40 Mg Caps (Amlodipine besy-benazepril hcl) .... Take 1 tablet by mouth once a day 15)  Trazodone Hcl 100 Mg Tabs (Trazodone hcl) .... Take 1 tab by mouth at bedtime 16)  Ativan 1 Mg Tabs (Lorazepam) .... Take 1 tablet by mouth four times a day 17)  Doxepin Hcl 50 Mg Caps (Doxepin hcl) .... Take 1 tablet by mouth three times a day 18)  Metoclopramide Hcl 5 Mg Tabs (Metoclopramide hcl) .... One by mouth qach and qhs 19)  Flexeril 10 Mg Tabs (Cyclobenzaprine hcl) .... Take 1 tablet by mouth two times a day 20)  Hydrocodone-acetaminophen 5-500 Mg Tabs (Hydrocodone-acetaminophen) .... Take 1 tablet by mouth two times a day 21)  Proair Hfa 108 (90 Base) Mcg/act Aers (Albuterol sulfate) .... 2 puffs at bedtime 22)  Carafate 1  Gm/49ml Susp (Sucralfate) .... 2 teaspoons (10ml) qid 23)  Flonase 50 Mcg/act Susp (Fluticasone propionate) .... 2 sprays once daily 24)  Nystop 100000 Unit/gm Powd (Nystatin) .... Two times a day 25)  Plavix 75 Mg Tabs (Clopidogrel bisulfate) .... Once daily 26)  Miacalcin 200 Unit/ml Soln (Calcitonin (salmon)) .... Once daily 27)  Lomotil 2.5-0.025 Mg Tabs (Diphenoxylate-atropine) .... As needed 28)  Meclizine Hcl 12.5 Mg Tabs (Meclizine hcl) .... Three times a day as needed 29)  Nabumetone 500 Mg Tabs (Nabumetone) .... Once daily 30)  Furosemide 20 Mg Tabs (Furosemide) .... Take 1 tablet by mouth once a day as needed 31)  Klor-con M20 20 Meq Cr-tabs (Potassium chloride crys cr) .... Take 1 tablet by mouth once a day as needed 32)  Glipizide 10 Mg Tabs (Glipizide) .... Take 1 tablet by mouth two times a day 33)  Onglyza 2.5 Mg Tabs (Saxagliptin hcl) .... Take 1 daily 34)  Doxycycline Hyclate 100 Mg Caps (Doxycycline hyclate) .... Take 1 two times a day x 10 days 35)  Benzonatate 100 Mg Caps (Benzonatate) .... Take 1 every 8 hrs as needed for cough  Patient  Instructions: 1)  Keep your next appt with Dr Lodema Hong. 2)  I have prescribed an antibiotic for your sinus infection. 3)  I have also prescribed a cough pill. 4)  You may try an over the counter Mucinex to help with your congestion. Prescriptions: BENZONATATE 100 MG CAPS (BENZONATATE) take 1 every 8 hrs as needed for cough  #20 x 0   Entered and Authorized by:   Esperanza Sheets PA   Signed by:   Esperanza Sheets PA on 09/03/2009   Method used:   Electronically to        [facility] (retail)       [address]       [place], [place]  [address]       Ph: [phone]       Fax: [phone]   RxID:   [id number] DOXYCYCLINE HYCLATE 100 MG CAPS (DOXYCYCLINE HYCLATE) take 1 two times a day x 10 days  #20 x 0   Entered and Authorized by:   Esperanza Sheets PA   Signed by:   Esperanza Sheets PA on 09/03/2009   Method used:   Electronically to        [facility] (retail)       [address]       [place], [place]  [address]       Ph: [phone]       Fax: [phone]   RxID:   [id number]

## 2010-04-14 NOTE — Progress Notes (Signed)
  Phone Note Call from Patient   Caller: Patient Summary of Call: patient states dr Kendell Bane wants dr simpson to change boniva to somthing else Initial call taken by: Worthy Keeler LPN,  March 24, 2009 10:02 AM  Follow-up for Phone Call        I am alreadfy aware, that is why I asked you to get the information from [facility] so she can get the injectable bone builder reclast, psl send d/c order to the pharmacy with my stamped sig Follow-up by: Syliva Overman MD,  March 24, 2009 10:17 AM  Additional Follow-up for Phone Call Additional follow up Details #1::        [facility]ed d/[facility] order to [facility] with stamped signature per doctor simpson Additional Follow-up by: Everitt Amber,  March 25, 2009 9:15 AM

## 2010-04-14 NOTE — Progress Notes (Signed)
Summary: fliuid pill  Phone Note Call from Patient   Summary of Call: legs and feet are swollen. and her sugar is 253. will doc call her in a fluid pill. a small dose [phone] Initial call taken by: Rudene Anda,  Jul 14, 2009 9:54 AM  Follow-up for Phone Call        pls advisept meds have been sent in, also reducecarb intake, drink water, I may have to inc the dose of med again for sugar but give it time Follow-up by: Syliva Overman MD,  Jul 14, 2009 10:41 AM  Additional Follow-up for Phone Call Additional follow up Details #1::        Phone Call Completed Additional Follow-up by: Adella Hare LPN,  Jul 15, 4538 11:55 AM    New/Updated Medications: FUROSEMIDE 20 MG TABS (FUROSEMIDE) Take 1 tablet by mouth once a day as needed KLOR-CON M20 20 MEQ CR-TABS (POTASSIUM CHLORIDE CRYS CR) Take 1 tablet by mouth once a day as needed Prescriptions: KLOR-CON M20 20 MEQ CR-TABS (POTASSIUM CHLORIDE CRYS CR) Take 1 tablet by mouth once a day as needed  #30 x 1   Entered and Authorized by:   Syliva Overman MD   Signed by:   Syliva Overman MD on 07/14/2009   Method used:   Electronically to        [facility] (retail)       [address]       [place], [place]  [address]       Ph: [phone]       Fax: [phone]   RxID:   [id number] FUROSEMIDE 20 MG TABS (FUROSEMIDE) Take 1 tablet by mouth once a day as needed  #30 x 1   Entered and Authorized by:   Syliva Overman MD   Signed by:   Syliva Overman MD on 07/14/2009   Method used:   Electronically to        [facility] (retail)       [address]       [place], [place]  [address]       Ph: [phone]       Fax: [phone]   RxID:   [id number]

## 2010-04-14 NOTE — Progress Notes (Signed)
Summary: cream  Phone Note Call from Patient   Summary of Call: also can she have some pain cream  votaren gel  [facility] to [facility] Initial call taken by: Lind Guest,  January 31, 2010 2:38 PM  Follow-up for Phone Call        med sent pls let herknow Follow-up by: Syliva Overman MD,  February 01, 2010 1:03 PM  Additional Follow-up for Phone Call Additional follow up Details #1::        patient aware Additional Follow-up by: Lind Guest,  February 01, 2010 1:52 PM    New/Updated Medications: VOLTAREN 1 % GEL (DICLOFENAC SODIUM) apply twice daily to affected area as neede Prescriptions: VOLTAREN 1 % GEL (DICLOFENAC SODIUM) apply twice daily to affected area as neede  #45gm x 1   Entered and Authorized by:   Syliva Overman MD   Signed by:   Syliva Overman MD on 02/01/2010   Method used:   Electronically to        [facility] (retail)       [address]       [place], [place]  [address]       Ph: [phone]       Fax: [phone]   RxID:   [id number]

## 2010-04-14 NOTE — Letter (Signed)
Summary: Letter  Letter   Imported By: Lind Guest 04/07/2009 08:45:53  _____________________________________________________________________  External Attachment:    Type:   Image     Comment:   External Document

## 2010-04-14 NOTE — Assessment & Plan Note (Signed)
Summary: F UP   Vital Signs:  Patient profile:   60 year old female Menstrual status:  hysterectomy Height:      62 inches Weight:      178.75 pounds BMI:     32.81 O2 Sat:      94 % Temp:     99.2 degrees F oral Pulse rate:   96 / minute Pulse rhythm:   regular Resp:     16 per minute BP sitting:   134 / 56 Cuff size:   regular  Vitals Entered By: Everitt Amber (March 18, 2009 10:57 AM)  Nutrition Counseling: Patient's BMI is greater than 25 and therefore counseled on weight management options. CC: Nasal drainage, coughing with phlegm in chest, left ear stopped up and she can't hear good, having some pain in right ear too. Unable to cough up mucus in chest, wheezing more at night. Achng on right side of abdomen   Primary Care Provider:  Lodema Hong  CC:  Nasal drainage, coughing with phlegm in chest, left ear stopped up and she can't hear good, having some pain in right ear too. Unable to cough up mucus in chest, and wheezing more at night. Achng on right side of abdomen.  History of Present Illness: 1 week h/o chest congestion with cough, treated in Ed for bronchitis, she has ahd fever and chills. She fell in the last 2 days, bruise to her right breast and left knee. Currently  getting injections in her spine. She denies marked fluctuations in her blood sugars or symptoms of uncontrolled levels. She reports improvement in her depression and anxiety since living on her own. she reports soreness and pain where she fell and bruised herself, there was no active bleeding or laceration.    Current Medications (verified): 1)  Singulair 10 Mg  Tabs (Montelukast Sodium) .... One Tab By Mouth Once Daily 2)  Zoloft 100 Mg  Tabs (Sertraline Hcl) .... Two Tabs By Mouth Once Daily 3)  Dicyclomine Hcl 20 Mg  Tabs (Dicyclomine Hcl) .... Take 1 Tab 3 Times Daily 4)  Evoxac 30 Mg Caps (Cevimeline Hcl) .... One Cap By Mouth Tid 5)  Boniva 150 Mg Tabs (Ibandronate Sodium) .... One Tab By Mouth  Every Month 6)  Mirtazapine 30 Mg Tabs (Mirtazapine) .... Take 1 Tab By Mouth At Bedtime 7)  Proair Hfa 108 (90 Base) Mcg/act Aers (Albuterol Sulfate) .... Two Puffs Qhs 8)  Tylenol 325 Mg Tabs (Acetaminophen) .... Take 1 Tablet By Mouth Three Times A Day 9)  Zofran 4 Mg Tabs (Ondansetron Hcl) .... Take 1 Tablet By Mouth Once A Day As Needed 10)  Voltaren 1 % Gel (Diclofenac Sodium) .... Apply Twice Daily As Needed To Affected Areas 11)  Plavix 75 Mg Tabs (Clopidogrel Bisulfate) .... Once Daily 12)  Aciphex 20 Mg Tbec (Rabeprazole Sodium) .... One By Mouth Two Times A Day 30 Mins Before A Meal (Breakfast and Evening Meal) 13)  Nystop 100000 Unit/gm Powd (Nystatin) .... Apply To Affected Areas Twice Daily 14)  Lotensin 40 Mg Tabs (Benazepril Hcl) .... Take 1 Tablet By Mouth Once A Day 15)  Levoxyl 50 Mcg Tabs (Levothyroxine Sodium) .... Take 1 Tablet By Mouth Once A Day 16)  Flonase 50 Mcg/act Susp (Fluticasone Propionate) .... Two Sprays Nostril Daily 17)  Gabapentin 300 Mg Caps (Gabapentin) .... Take 1 Capsule By Mouth Three Times A Day 18)  Clonidine Hcl 0.2 Mg Tabs (Clonidine Hcl) .... Take 1 Tablet By Mouth Two Times  A Day 19)  Metoprolol Tartrate 25 Mg Tabs (Metoprolol Tartrate) .... Take 1 Tablet By Mouth Two Times A Day 20)  Vitamin D (Ergocalciferol) 50000 Unit Caps (Ergocalciferol) .... One Cap By Mouth Q Week 21)  Robaxin 500 Mg Tabs (Methocarbamol) .... One By Mouth Q 6 Hrs 22)  Catapres-Tts-3 0.3 Mg/24hr Ptwk (Clonidine Hcl) .... Apply One Patch Weekly 23)  Amlodipine Besylate 5 Mg Tabs (Amlodipine Besylate) .... Take 1 Tablet By Mouth Once A Day 24)  Lomotil 2.5-0.025 Mg Tabs (Diphenoxylate-Atropine) .... Take 1 Tablet By Mouth Four Times A Day As Needed 25)  Glipizide 2.5 Mg Xr24h-Tab (Glipizide) .... 2 Tablets in The Morning At Breakfast and 1 Tablet At Suppertime 26)  Antivert 12.5 Mg Tabs (Meclizine Hcl) .... Take 1 Tablet By Mouth Three Times A Day As Needed 27)  Crestor 10 Mg  Tabs (Rosuvastatin Calcium) .... Take 1 Tab By Mouth At Bedtime 28)  Lamictal 100 Mg Tabs (Lamotrigine) .... One By Mouth Daily 29)  Doxycycline Hyclate 100 Mg Caps (Doxycycline Hyclate) .... Take 1 Capsule By Mouth Two Times A Day 30)  Tessalon Perles 100 Mg Caps (Benzonatate) .... Take 1 Capsule By Mouth Three Times A Day 31)  Aurodex 1.4-5.4 % Soln (Benzocaine-Antipyrine) .... 2 Drops in Affected Ear(S) Three Times Daily As Needed 32)  Doxepin Hcl 50 Mg Caps (Doxepin Hcl) .[name].. 1 Cap At Bedtime X 3 Days, Then 1 Cap Two Times A Day X 3 Days, Then 1 Cap Once Daily  Allergies (verified): 1)  ! * Keflex 2)  ! Pcn 3)  ! Pyridium 4)  * Milk Products  Review of Systems      See HPI General:  Complains of chills, fatigue, fever, loss of appetite, malaise, and weakness. Eyes:  Denies blurring and discharge. ENT:  Complains of hoarseness, nasal congestion, nosebleeds, sinus pressure, and sore throat; left epistaxis x 1 wek. CV:  Denies chest pain or discomfort and palpitations. Resp:  Complains of cough and sputum productive. GI:  Complains of nausea; denies abdominal pain, constipation, and vomiting; intermittent nausea since she has been ill. GU:  Denies dysuria and urinary frequency. MS:  Complains of low back pain and muscle weakness; recurrent falls bruising the right breast and left knee in the past 3 days. Neuro:  Complains of falling down, headaches, and weakness; denies seizures and sensation of room spinning. Psych:  Complains of anxiety, easily angered, and mental problems; denies suicidal thoughts/plans and thoughts of violence; pt doing better since living on her own. Endo:  Denies cold intolerance, excessive hunger, excessive thirst, excessive urination, heat intolerance, polyuria, and weight change. Heme:  See HPI; Complains of abnormal bruising. Allergy:  Complains of seasonal allergies.  Physical Exam  General:  Well-developed,well-nourished,in no acute distress;  alert,appropriate and cooperative throughout examination HEENT: No facial asymmetry,  EOMI, positive  sinus tenderness, TM's Clear, oropharynx  pink and moist.   Chest:decreased air entry, scattered crackles and wheezes CVS: S1, S2, No murmurs, No S3.   Abd: Soft, Nontender.  MS: decreased ROM spine, hips, shoulders and knees.  Ext: No edema.   CNS: CN 2-12 intact, power tone and sensation normal throughout.   Skinbruise on right breast approx 8 inches diameter, and bruise on left knee Psych: Good eye contact, normal affect.  Memory intact, not anxious or depressed appearing.    Impression & Recommendations:  Problem # 1:  ACUTE SINUSITIS, UNSPECIFIED (ICD-461.9) Assessment Comment Only  The following medications were removed from the medication list:  Septra Ds 800-160 Mg Tabs (Sulfamethoxazole-trimethoprim) .[name]... Take 1 tablet by mouth two times a day Her updated medication list for this problem includes:    Flonase 50 Mcg/act Susp (Fluticasone propionate) .[name]..[name] Two sprays nostril daily    Doxycycline Hyclate 100 Mg Caps (Doxycycline hyclate) .[name]... Take 1 capsule by mouth two times a day    Tessalon Perles 100 Mg Caps (Benzonatate) .[name]... Take 1 capsule by mouth three times a day  Problem # 2:  ACUTE BRONCHITIS (ICD-466.0) Assessment: Comment Only  The following medications were removed from the medication list:    Septra Ds 800-160 Mg Tabs (Sulfamethoxazole-trimethoprim) .[name]... Take 1 tablet by mouth two times a day Her updated medication list for this problem includes:    Singulair 10 Mg Tabs (Montelukast sodium) ..... One tab by mouth once daily    Proair Hfa 108 (90 Base) Mcg/act Aers (Albuterol sulfate) .[name]..[name] Two puffs qhs    Doxycycline Hyclate 100 Mg Caps (Doxycycline hyclate) .[name]... Take 1 capsule by mouth two times a day    Tessalon Perles 100 Mg Caps (Benzonatate) .[name]... Take 1 capsule by mouth three times a day  Orders: Albuterol Sulfate Sol 1mg  unit dose  (Z6109) Atrovent 1mg  (Neb) (U0454) Nebulizer Tx (09811)  Problem # 3:  HYPERTENSION (ICD-401.9) Assessment: Unchanged  Her updated medication list for this problem includes:    Lotensin 40 Mg Tabs (Benazepril hcl) .[name]... Take 1 tablet by mouth once a day    Clonidine Hcl 0.2 Mg Tabs (Clonidine hcl) .[name]... Take 1 tablet by mouth two times a day    Metoprolol Tartrate 25 Mg Tabs (Metoprolol tartrate) .[name]... Take 1 tablet by mouth two times a day    Catapres-tts-3 0.3 Mg/24hr Ptwk (Clonidine hcl) .[name]... Apply one patch weekly    Amlodipine Besylate 5 Mg Tabs (Amlodipine besylate) .[name]... Take 1 tablet by mouth once a day  BP today: 134/56 Prior BP: 130/60 (02/22/2009)  Labs Reviewed: K+: 4.4 (12/28/2008) Creat: : 0.80 (12/28/2008)   Chol: 190 (12/28/2008)   HDL: 46 (12/28/2008)   LDL: 106 (12/28/2008)   TG: 192 (12/28/2008)  Problem # 4:  NAUSEA (ICD-787.02) Assessment: Deteriorated  Her updated medication list for this problem includes:    Zofran 4 Mg Tabs (Ondansetron hcl) .[name]... Take 1 tablet by mouth once a day as needed    Antivert 12.5 Mg Tabs (Meclizine hcl) .[name]... Take 1 tablet by mouth three times a day as needed  Orders: Zofran 1mg . injection (B1478)  Problem # 5:  DIABETES MELLITUS (ICD-250.00) Assessment: Improved  Her updated medication list for this problem includes:    Lotensin 40 Mg Tabs (Benazepril hcl) .[name]... Take 1 tablet by mouth once a day    Glipizide 2.5 Mg Xr24h-tab (Glipizide) .[name]... 2 tablets in the morning at breakfast and 1 tablet at suppertime  Orders: Glucose, (CBG) (82962) Hemoglobin A1C (83036)  Labs Reviewed: Creat: 0.80 (12/28/2008)    Reviewed HgBA1c results: 6.6 (03/18/2009)  7.0 (12/11/2008)  Problem # 6:  BREAST MASS, RIGHT (ICD-611.72) Assessment: Comment Only  Orders: Radiology Referral (Radiology)  Problem # 7:  BACK PAIN WITH RADICULOPATHY (ICD-729.2) Assessment: Deteriorated  Orders: Ketorolac-Toradol 15mg  (G9562) Admin of  Therapeutic Inj  intramuscular or subcutaneous (13086)  Complete Medication List: 1)  Singulair 10 Mg Tabs (Montelukast sodium) .... One tab by mouth once daily 2)  Zoloft 100 Mg Tabs (Sertraline hcl) .... Two tabs by mouth once daily 3)  Dicyclomine Hcl 20 Mg Tabs (Dicyclomine hcl) .... Take 1 tab 3  times daily 4)  Evoxac 30 Mg Caps (Cevimeline hcl) .... One cap by mouth tid 5)  Boniva 150 Mg Tabs (Ibandronate sodium) .... One tab by mouth every month 6)  Mirtazapine 30 Mg Tabs (mirtazapine)  .... Take 1 tab by mouth at bedtime 7)  Proair Hfa 108 (90 Base) Mcg/act Aers (Albuterol sulfate) .... Two puffs qhs 8)  Tylenol 325 Mg Tabs (Acetaminophen) .... Take 1 tablet by mouth three times a day 9)  Zofran 4 Mg Tabs (Ondansetron hcl) .... Take 1 tablet by mouth once a day as needed 10)  Voltaren 1 % Gel (Diclofenac sodium) .... Apply twice daily as needed to affected areas 11)  Plavix 75 Mg Tabs (Clopidogrel bisulfate) .... Once daily 12)  Aciphex 20 Mg Tbec (Rabeprazole sodium) .... One by mouth two times a day 30 mins before a meal (breakfast and evening meal) 13)  Nystop 100000 Unit/gm Powd (Nystatin) .... Apply to affected areas twice daily 14)  Lotensin 40 Mg Tabs (Benazepril hcl) .... Take 1 tablet by mouth once a day 15)  Levoxyl 50 Mcg Tabs (Levothyroxine sodium) .... Take 1 tablet by mouth once a day 16)  Flonase 50 Mcg/act Susp (Fluticasone propionate) .... Two sprays nostril daily 17)  Gabapentin 300 Mg Caps (Gabapentin) .... Take 1 capsule by mouth three times a day 18)  Clonidine Hcl 0.2 Mg Tabs (Clonidine hcl) .... Take 1 tablet by mouth two times a day 19)  Metoprolol Tartrate 25 Mg Tabs (Metoprolol tartrate) .... Take 1 tablet by mouth two times a day 20)  Vitamin D (ergocalciferol) 50000 Unit Caps (Ergocalciferol) .... One cap by mouth q week 21)  Robaxin 500 Mg Tabs (Methocarbamol) .... One by mouth q 6 hrs 22)  Catapres-tts-3 0.3 Mg/24hr Ptwk (Clonidine hcl) .... Apply one  patch weekly 23)  Amlodipine Besylate 5 Mg Tabs (Amlodipine besylate) .... Take 1 tablet by mouth once a day 24)  Lomotil 2.5-0.025 Mg Tabs (Diphenoxylate-atropine) .... Take 1 tablet by mouth four times a day as needed 25)  Glipizide 2.5 Mg Xr24h-tab (Glipizide) .... 2 tablets in the morning at breakfast and 1 tablet at suppertime 26)  Antivert 12.5 Mg Tabs (Meclizine hcl) .... Take 1 tablet by mouth three times a day as needed 27)  Crestor 10 Mg Tabs (Rosuvastatin calcium) .... Take 1 tab by mouth at bedtime 28)  Lamictal 100 Mg Tabs (Lamotrigine) .... One by mouth daily 29)  Doxycycline Hyclate 100 Mg Caps (Doxycycline hyclate) .... Take 1 capsule by mouth two times a day 30)  Tessalon Perles 100 Mg Caps (Benzonatate) .... Take 1 capsule by mouth three times a day 31)  Aurodex 1.4-5.4 % Soln (Benzocaine-antipyrine) .... 2 drops in affected ear(s) three times daily as needed 32)  Doxepin Hcl 50 Mg Caps (Doxepin hcl) .[name].. 1 cap at bedtime x 3 days, then 1 cap two times a day x 3 days, then 1 cap once daily  Other Orders: T-Lipid Profile (16109-60454) T-Hepatic Function (09811-91478) T-TSH (29562-13086)  Patient Instructions: 1)  Please schedule a follow-up appointment in 3 months. 2)  Please take the entire course of antibiotics for your sinuses and bronchitis. 3)  Pls be careful about falls. 4)  Hepatic Panel prior to visit, ICD-9: 5)  Lipid Panel prior to visit, ICD-9:  fasting l;abs 6)  TSH prior to visit, ICD-9:   Medication Administration  Injection # 1:    Medication: Ketorolac-Toradol 15mg     Diagnosis: BACK PAIN WITH RADICULOPATHY (ICD-729.2)  Route: IM    Site: RUOQ gluteus    Exp Date: 10/12/2010    Lot #: [id number]    Mfr: [email or web address]    Comments: toradol 60mg  given    Patient tolerated injection without complications    Given by: Worthy Keeler LPN (March 18, 2009 11:50 AM)  Injection # 2:    Medication: Zofran 1mg . injection    Diagnosis: NAUSEA (ICD-787.02)     Route: IM    Site: LUOQ gluteus    Exp Date: 6/11    Lot #: [id number]    Mfr: [email or web address]    Comments: zofran 4mg  given    Patient tolerated injection without complications    Given by: Worthy Keeler LPN (March 18, 2009 11:51 AM)  Medication # 1:    Medication: Albuterol Sulfate Sol 1mg  unit dose    Diagnosis: ACUTE BRONCHITIS (ICD-466.0)    Dose: 2.5/11ml    Route: inhaled    Exp Date: 8/11    Lot #: [id number]    Mfr: nephron    Patient tolerated medication without complications    Given by: Everitt Amber (March 18, 2009 2:25 PM)  Medication # 2:    Medication: Atrovent 1mg  (Neb)    Diagnosis: ACUTE BRONCHITIS (ICD-466.0)    Dose: 0.5/2.51ml    Route: inhaled    Exp Date: 10/2009    Patient tolerated medication without complications    Given by: Everitt Amber (March 18, 2009 2:29 PM)  Orders Added: 1)  Est. Patient Level IV [99214] 2)  T-Lipid Profile [80061-22930] 3)  T-Hepatic Function [80076-22960] 4)  T-TSH [13086-57846] 5)  Ketorolac-Toradol 15mg  [J1885] 6)  Zofran 1mg . injection [J2405] 7)  Admin of Therapeutic Inj  intramuscular or subcutaneous [96372] 8)  Glucose, (CBG) [82962] 9)  Hemoglobin A1C [83036] 10)  Albuterol Sulfate Sol 1mg  unit dose [J7613] 11)  Atrovent 1mg  (Neb) [N6295] 12)  Nebulizer Tx [28413] 13)  Radiology Referral [Radiology]   Laboratory Results   Blood Tests   Date/Time Received: March 18, 2009  Date/Time Reported: March 18, 2009   Glucose (random): 144 mg/dL   (Normal Range: 24-401) HGBA1C: 6.6%   (Normal Range: Non-Diabetic - 3-6%   Control Diabetic - 6-8%)

## 2010-04-14 NOTE — Progress Notes (Signed)
Summary: lab results  Phone Note Call from Patient   Summary of Call: needs results of last blood test [phone] Initial call taken by: Rudene Anda,  April 19, 2009 10:33 AM  Follow-up for Phone Call        Phone Call Completed Follow-up by: Worthy Keeler LPN,  April 19, 2009 11:10 AM

## 2010-04-14 NOTE — Progress Notes (Signed)
Summary: speak with nurse  Phone Note Call from Patient   Summary of Call: needs to speak with jamie about the changes of meds. [phone] Initial call taken by: Rudene Anda,  June 01, 2009 1:31 PM  Follow-up for Phone Call        returned call, no answer Follow-up by: Adella Hare LPN,  June 01, 2009 4:26 PM  Additional Follow-up for Phone Call Additional follow up Details #1::        patient is totally confused about medications, what she should be taking, patient has too many meds and needs these reviewed by dr simpson  can we get her an earlier appt than april 14 with dr simpson? patient really confused Additional Follow-up by: Adella Hare LPN,  June 01, 2009 4:34 PM    Additional Follow-up for Phone Call Additional follow up Details #2::    APPT. CHANGED TO 3.29.11 @ 10:00 AM Follow-up by: Lind Guest,  June 02, 2009 9:54 AM

## 2010-04-14 NOTE — Progress Notes (Signed)
Summary: phone note/diarrhea  Phone Note Call from Patient   Caller: Patient Summary of Call: Pt called and said she is having diarrhea, coming out like a faucet. Said she has had to use almost a box of Depends this AM. 'No blood in stool and no other complaints. Please advise. Initial call taken by: Cloria Spring LPN,  September 21, 2009 11:45 AM     Appended Document: phone note/diarrhea Verbal from Dr. Jena Gauss. He said tell pt to take Immodium after each stool up to 4 times a day. Get a  set of stool studies. I called and informed her. She said she will do the Immodium, but she doesn't have anyone to pick up the bottles for stool samples. She does have appt here on Thursday with  Dr. Jena Gauss. Said she already has transportation planned for that, with  [employer]. She was advised to stay hydrated, she said she is drinking alot of water. Orders for stool studies and bottles available.

## 2010-04-14 NOTE — Medication Information (Signed)
Summary: METOCLOPRAMIDE 5MG   METOCLOPRAMIDE 5MG    Imported By: Rexene Alberts 02/18/2010 11:27:09  _____________________________________________________________________  External Attachment:    Type:   Image     Comment:   External Document  Appended Document: METOCLOPRAMIDE 5MG     Prescriptions: METOCLOPRAMIDE HCL 5 MG TABS (METOCLOPRAMIDE HCL) one by mouth qach and qhs  #120 x 3   Entered and Authorized by:   Gerrit Halls NP   Signed by:   Gerrit Halls NP on 02/21/2010   Method used:   Faxed to ...       [facility] (retail)       [address]       [place], [place]  [address]       Ph: [phone]       Fax: [phone]   RxID:   [id number]

## 2010-04-14 NOTE — Progress Notes (Signed)
Summary: bruising  Phone Note Call from Patient   Summary of Call: patient needs you to call her about bruises coming up for no reason wants to know if she should be concerned.  Please advise. Initial call taken by: Curtis Sites,  January 10, 2010 10:31 AM  Follow-up for Phone Call        pls advise hat in August her lab showed normal platelets which prvent bruisng, she needs a rept cbc , a pT /pTT ordered to evaluate bruising before I can accurately answer the question , pls explain and she needs labs drawn Follow-up by: Syliva Overman MD,  January 11, 2010 5:19 AM  Additional Follow-up for Phone Call Additional follow up Details #1::        patient aware and lab ordered Additional Follow-up by: Adella Hare LPN,  January 11, 2010 9:06 AM

## 2010-04-14 NOTE — Progress Notes (Signed)
Summary: DR. Pricilla Holm  DR. TUCKER   Imported By: Lind Guest 06/07/2009 14:33:45  _____________________________________________________________________  External Attachment:    Type:   Image     Comment:   External Document

## 2010-04-14 NOTE — Letter (Signed)
Summary: MEDICAL RELEASE  MEDICAL RELEASE   Imported By: Lind Guest 03/29/2010 13:26:54  _____________________________________________________________________  External Attachment:    Type:   Image     Comment:   External Document

## 2010-04-14 NOTE — Assessment & Plan Note (Signed)
Summary: CRD/GLU  pt returned ifobt and it was negative  Allergies: 1)  ! * Keflex 2)  ! Pcn 3)  ! Pyridium 4)  * Milk Products  Other Orders: Immuno-chemical Fecal Occult (04540)  Appended Document: CRD/GLU Good. Await egd findings.

## 2010-04-14 NOTE — Letter (Signed)
Summary: [facility]  [address]   [place], [place] [address]   Phone: [phone]  Fax: [phone]     March 17, 2009   [facility] [address] [place], [place]  [address] Jul 22, 1950  Dear [name],  During your last appointment, your doctor requested you have some blood work and a hemosure stool sample.  Our records indicate you have not had this done.  Remember it is very important to follow your doctor's instructions.   Please have this blood work done as soon as possible and return the stool sample to our office.  It is important that patients and their doctor work together in the management/treatment of their health care.  If you have lost or misplaced your lab orders, please give [name] a call and we will gladly give you a new copy.  If you have already had your blood work drawn, please disregard this letter.  Thank you,    Hendricks Limes LPN  [facility] Ph: [phone]   Fax: [phone]

## 2010-04-14 NOTE — Progress Notes (Signed)
  Phone Note Call from Patient   Caller: Patient Summary of Call:  FYI: patient will be stopping plavix for two weeks before procedure on sinuses.  Initial call taken by: Adella Hare LPN,  January 17, 2010 2:06 PM

## 2010-04-14 NOTE — Letter (Signed)
Summary: consult  consult   Imported By: Curtis Sites 11/17/2009 16:41:52  _____________________________________________________________________  External Attachment:    Type:   Image     Comment:   External Document

## 2010-04-14 NOTE — Progress Notes (Signed)
Summary: LAB RESULTS  Phone Note Call from Patient   Summary of Call: WANTS LAB RESULTS CALL HER BACK Initial call taken by: Lind Guest,  September 17, 2009 8:38 AM  Follow-up for Phone Call        advise kidneys and liver good, cholesterol is good, triglycerides high, need to cut back on fried and fatty foods.she also needs to inc to onglyza 5mg  one daily since her blood sugar is uncontrolled, alsoreduce carbsand sweetspls fax in new script, itis printed Follow-up by: Syliva Overman MD,  September 17, 2009 12:52 PM  Additional Follow-up for Phone Call Additional follow up Details #1::        patient aware Additional Follow-up by: Adella Hare LPN,  September 17, 6576 2:57 PM

## 2010-04-14 NOTE — Progress Notes (Signed)
Summary: ASSRSMENT [facility]  ASSRSMENT WHEELCHAIR   Imported By: Lind Guest 10/25/2009 14:39:58  _____________________________________________________________________  External Attachment:    Type:   Image     Comment:   External Document

## 2010-04-14 NOTE — Progress Notes (Signed)
  Phone Note Call from Patient   Summary of Call: She said that Bethea's office told her that you told them not to issue any meds to her because of her mental status and they won't give her any meds for spasms and she is in pain and she is very mad and she said that she has to walk around in pain not you. Ok to refill her old dose of spasm med (but 1 less) per Dr. Lodema Hong. Will fill and notify patient of changes.   [phone] cell [phone] Initial call taken by: Everitt Amber LPN,  July 01, 2009 1:36 PM  Follow-up for Phone Call        whenI told her i sent in the spasm med she started crying again and said that she is in so much pain and that doesn nothing for it. It helps the spasms but not the pain. She said that the med Dawn gave her last time helped her so much. It was Hydrocodone. I told her that I didn't know if you would give it to her but I would ask. She was all depressed and very upset. She kept promising that she wasn't abusing her meds and she needed them for her extreme pain Follow-up by: Everitt Amber LPN,  July 01, 2009 1:46 PM    Additional Follow-up for Phone Call Additional follow up Details #2::    advise I will sen in some hydrocodone same dose and direction she had got from the office, but if she needs more then she will aBSOLUTELY need to go bak to Dr Eduard Clos for pain mx, pls fax script Follow-up by: Syliva Overman MD,  July 01, 2009 2:59 PM  Additional Follow-up for Phone Call Additional follow up Details #3:: Details for Additional Follow-up Action Taken: Patient aware Additional Follow-up by: Everitt Amber LPN,  July 01, 2009 3:03 PM  New/Updated Medications: FLEXERIL 10 MG TABS (CYCLOBENZAPRINE HCL) Take 1 tablet by mouth two times a day HYDROCODONE-ACETAMINOPHEN 5-500 MG TABS (HYDROCODONE-ACETAMINOPHEN) Take 1 tablet by mouth two times a day Prescriptions: HYDROCODONE-ACETAMINOPHEN 5-500 MG TABS (HYDROCODONE-ACETAMINOPHEN) Take 1 tablet by mouth two times a day   #60 x 2   Entered and Authorized by:   Syliva Overman MD   Signed by:   Syliva Overman MD on 07/01/2009   Method used:   Printed then faxed to ...       [facility] (retail)       [address]       [place], [place]  [address]       Ph: [phone]       Fax: [phone]   RxID:   [id number] FLEXERIL 10 MG TABS (CYCLOBENZAPRINE HCL) Take 1 tablet by mouth two times a day  #60 x 3   Entered by:   Everitt Amber LPN   Authorized by:   Syliva Overman MD   Signed by:   Everitt Amber LPN on 34/74/2595   Method used:   Electronically to        [facility] (retail)       [address]       [place], [place]  [address]       Ph: [phone]       Fax: [phone]   RxID:   [id number]

## 2010-04-14 NOTE — Letter (Signed)
Summary: DR. Suszanne Conners  DR. TEOH   Imported By: Lind Guest 11/25/2009 14:21:27  _____________________________________________________________________  External Attachment:    Type:   Image     Comment:   External Document

## 2010-04-14 NOTE — Progress Notes (Signed)
Summary: Pt asking why she is having so many tests, etc  Phone Note Call from Patient   Caller: Patient Summary of Call: Pt called and asked why was she having so many tests done. Said she has had tests over and over and nobody tells her  what is wrong. She just wants to know what is wrong. Said all she does is go to doctors and get more meds. She said "they are not helping me walk, talk or feel better and I just wnat to know what is wrong. Initial call taken by: Cloria Spring LPN,  March 31, 2009 1:58 PM     Appended Document: Pt asking why she is having so many tests, etc Spoke with patient.  Very garbled speech.  Difficult to understand her and she was falling asleep during phone call.  She did not have specific compaint with me.  Asked I not call her POA.  Tried to call Margorie John but no answer.  Called Dr. Lodema Hong.  Made aware of above changes. She will have her office call patient and make further recommendations, ie possible ED visit.

## 2010-04-14 NOTE — Assessment & Plan Note (Signed)
Summary: FU OV IN 4-6 WEEKS W/EXTENDER/DYSPHAGIA/SS   Visit Type:  Follow-up Visit [facility]:  Lodema Hong  CC:  follow up dysphagia.  History of Present Illness: [name] is a 60 year old African American female who presents today in follow-up after her colonoscopy and EGD with ED performed October 11, 2009 with Dr. Jena Gauss. These procedures were done secondary to dysphagia, diarrhea, and heme + stool. Ms. Steinberg has a significant history of gastroparesis, GERD, IBS, chronic anemia, and dysphagia. She presents today stating diarrhea has resolved. Reports BM 3-4X daily, soft, no rectal bleeding. Reports reflux controlled, on Aciphex. Denies dysphagia except if she does not chew tough textures thoroughly such as beef. Does c/o small amount of dry heaves early in the morning, sometimes at night. Denies nausea, denies emesis.  Reports chronic lower back pain, requested medication for pain. Last H/H on 10/26/09 stable and actually improved at 11.4/38.5. 10/11/09: EGD with ED and colonoscopy: non-critical Schatzki ring, biopsy benign, rectal polyp, left-sided divertiucla, biopsies benign. She has a hx of chronic back pain as well and sees Drs. Algis Greenhouse, and [facility].   Current Problems (verified): 1)  Dysphagia  (ICD-787.29) 2)  Urinary Urgency  (ICD-788.63) 3)  Hearing Loss  (ICD-389.9) 4)  Lom  (ICD-382.9) 5)  Cervical Lymphadenopathy, Left  (ICD-785.6) 6)  Hemoccult Positive Stool  (ICD-578.1) 7)  Diarrhea  (ICD-787.91) 8)  Sinusitis, Acute  (ICD-461.9) 9)  Gastroparesis  (ICD-536.3) 10)  Ankle Pain  (ICD-719.47) 11)  Dysphagia Unspecified  (ICD-787.20) 12)  Liver Function Tests, Abnormal, Hx of  (ICD-V12.2) 13)  Pancreatitis, Hx of  (ICD-V12.70) 14)  Ibs  (ICD-564.1) 15)  Hyperlipidemia  (ICD-272.4) 16)  Acromioclavicular Sprain and Strain  (ICD-840.0) 17)  Leg Pain, Right  (ICD-729.5) 18)  Gerd  (ICD-530.81) 19)  Abdominal Pain, Generalized  (ICD-789.07) 20)  Abdominal Bloating   (ICD-787.3) 21)  Anemia  (ICD-285.9) 22)  Milk Products Allergy  (ICD-V15.02) 23)  Leg Pain, Right  (ICD-729.5) 24)  Screening For Lipoid Disorders  (ICD-V77.91) 25)  Fatigue  (ICD-780.79) 26)  Back Pain With Radiculopathy  (ICD-729.2) 27)  Allergy Unspecified Not Elsewhere Classified  (ICD-995.3) 28)  Nausea  (ICD-787.02) 29)  Headache  (ICD-784.0) 30)  Abnormality of Gait  (ICD-781.2) 31)  Obesity  (ICD-278.00) 32)  Psychotic D/o W/hallucinations Conds Class Elsw  (ICD-293.82) 33)  Insomnia  (ICD-780.52) 34)  Anemia, Iron Deficiency, Chronic  (ICD-280.9) 35)  Hypertension  (ICD-401.9) 36)  Diverticulosis, Colon  (ICD-562.10) 37)  Osteoporosis  (ICD-733.00) 38)  Sleep Apnea  (ICD-780.57) 39)  Back Pain, Chronic  (ICD-724.5) 40)  Nausea, Chronic  (ICD-787.02) 41)  Diabetes Mellitus  (ICD-250.00) 42)  Trigger Finger  (ICD-727.3)  Current Medications (verified): 1)  Singulair 10 Mg  Tabs (Montelukast Sodium) .... One Tab By Mouth Once Daily 2)  Zoloft 100 Mg  Tabs (Sertraline Hcl) .... Two Tabs By Mouth Once Daily 3)  Evoxac 30 Mg Caps (Cevimeline Hcl) .... One Cap By Mouth Tid 4)  Mirtazapine 30 Mg Tabs (Mirtazapine) .... Take 1 Tab By Mouth At Bedtime 5)  Levoxyl 50 Mcg Tabs (Levothyroxine Sodium) .... Take 1 Tablet By Mouth Once A Day 6)  Metoprolol Tartrate 25 Mg Tabs (Metoprolol Tartrate) .... Take 1 Tablet By Mouth Two Times A Day 7)  Crestor 10 Mg Tabs (Rosuvastatin Calcium) .... Take 1 Tab By Mouth At Bedtime 8)  Lamictal 100 Mg Tabs (Lamotrigine) .... One By Mouth Daily 9)  Amlodipine Besy-Benazepril Hcl 5-40 Mg Caps (Amlodipine Besy-Benazepril Hcl) .Marland KitchenMarland KitchenMarland Kitchen  Take 1 Tablet By Mouth Once A Day 10)  Trazodone Hcl 100 Mg Tabs (Trazodone Hcl) .... Take 1 Tab By Mouth At Bedtime 11)  Ativan 1 Mg Tabs (Lorazepam) .... Take 1 Tablet By Mouth Four Times A Day 12)  Metoclopramide Hcl 5 Mg Tabs (Metoclopramide Hcl) .... One By Mouth Qach and Qhs 13)  Flexeril 10 Mg Tabs (Cyclobenzaprine  Hcl) .... Take 1 Tablet By Mouth Two Times A Day 14)  Plavix 75 Mg Tabs (Clopidogrel Bisulfate) .... Once Daily 15)  Miacalcin 200 Unit/ml Soln (Calcitonin (Salmon)) .... Once Daily 16)  Lomotil 2.5-0.025 Mg Tabs (Diphenoxylate-Atropine) .... As Needed 17)  Glipizide 10 Mg Tabs (Glipizide) .... Take 1 Tablet By Mouth Two Times A Day 18)  Zofran 4 Mg Tabs (Ondansetron Hcl) .... Take 1 Tablet By Mouth Once A Day As Needed Severe Nausea 19)  Onglyza 5 Mg Tabs (Saxagliptin Hcl) .... Take 1 Tablet By Mouth Once A Day 20)  Aciphex 20 Mg Tbec (Rabeprazole Sodium) .... Once Daily 21)  Nystop 100000 Unit/gm Powd (Nystatin) .... Two Times A Day 22)  Furosemide 20 Mg Tabs (Furosemide) .... Once Daily As Needed 23)  Klor-Con M20 20 Meq Cr-Tabs (Potassium Chloride Crys Cr) .... As Needed With Furosemide 24)  Vitamin D 50000u .... Q Week 25)  Clonidine Hcl 0.3 Mg Tabs (Clonidine Hcl) .... Take 1 Tablet By Mouth Two Times A Day  Allergies (verified): 1)  ! * Keflex 2)  ! Pcn 3)  ! Pyridium 4)  * Milk Products  Past History:  Past Surgical History: Last updated: 05/12/2008 hysterectomy cholecystectomy ovarian cystectomy carpal tunnell release left by Dr. Romeo Apple 09/02/04 open right carpal tunnel release by Dr. Romeo Apple 07/22/04 breast reduction surgery B cataract surgery in 2009 Biopsy of thyroid gland 2009 surgical excision of 3 tumors from r thigh and R buttock and left upper thigh  019/2010  Past Medical History: Bipolar d/o CVA H/O pancreatitis due to Depakote with normal EUS in 2006 OSTEOPOROSIS (ICD-733.00) SLEEP APNEA (ICD-780.57) BACK PAIN, CHRONIC (ICD-724.5) NAUSEA, CHRONIC (ICD-787.02) DIABETES MELLITUS (ICD-250.00) TRIGGER FINGER (ICD-727.3) Anxiety Disorder Hypertension Migraines Simple adenomas and hyperplastic polyps on TCS in 2001  TCS  9/08 by Dr. Lina Sar for diarrhea.  Bx negative for microscopic colitits.  She had diverticulsis.   EGD with ED 10/2009 with [facility]:  non-critical Schatzki ring, rectal polyp, left-sided diverticula, biopsies benign Glaucoma  Review of Systems General:  Denies fever, chills, and anorexia. Eyes:  Denies blurring, irritation, and discharge. ENT:  Denies sore throat, hoarseness, and difficulty swallowing. CV:  Denies chest pains, angina, and dyspnea on exertion. Resp:  Denies dyspnea at rest, cough, and wheezing. GI:  Complains of difficulty swallowing; denies pain on swallowing, indigestion/heartburn, abdominal pain, diarrhea, constipation, change in bowel habits, and bloody BM's; if she does not chew thoroughly. GU:  Denies urinary burning, blood in urine, and urinary incontinence. MS:  Complains of low back pain. Derm:  Denies rash, itching, and dry skin. Neuro:  Denies weakness, syncope, and frequent headaches. Psych:  Complains of depression; denies anxiety; secondary to chronic pain.  Vital Signs:  Patient profile:   59 year old female Menstrual status:  hysterectomy Height:      62 inches Weight:      173 pounds BMI:     31.76 Temp:     98.0 degrees F oral Pulse rate:   60 / minute BP sitting:   122 / 80  (right arm) Cuff size:   regular  Vitals Entered By: Hendricks Limes LPN (December 27, 2009 10:21 AM)  Physical Exam  General:  no apparent distress, drowsy but easily arouses.  Head:  Normocephalic and atraumatic. Eyes:  non-icteric, conjuctiva clear Mouth:  No deformity or lesions, dentition normal. Lungs:  Clear throughout to auscultation. Heart:  Regular rate and rhythm; no murmurs, rubs,  or bruits. Abdomen:  normal bowel sounds, obese, without guarding, without rebound, and no hepatomegally or splenomegaly.   Pulses:  Normal pulses noted. Extremities:  No clubbing, cyanosis, edema or deformities noted. Neurologic:  alert and oriented X 3, however drowsy but easily converses when questioned.   Impression & Recommendations:  Problem # 1:  DYSPHAGIA (GEX-528.41) Assessment Improved  60 year old  African-American female with history of dysphagia, status post EGD with ED on 8/11, now improved. States tolerating diet without pain or dysphagia; however, does report difficulty with tough textures if she does not chew thoroughly.   Continue current management, chew food thoroughly, contact our office if further issues with dysphagia  Orders: Est. Patient Level II (32440)  Problem # 2:  GERD (ICD-530.81)  well-controlled with Aciphex. Continue current management.   Problem # 3:  BACK PAIN, CHRONIC (ICD-724.5) Hx of chronic back pain, sees pain management. Continue to follow-up with Drs. Bethea, [facility], and [facility] regarding any treatment, recommendations.   Problem # 4:  GASTROPARESIS (ICD-536.3)  denies nausea or emesis; however does report intermittent dry heaves early in morning and sometimes late evening. On Reglan 5 mg by mouth  qac and at bedtime.  Maintain current dose of Reglan. Would avoid increasing dose due to amount of current medications. Restora samples, 1 week's worth given to pt.   Patient Instructions: 1)  Continue to chew food thoroughly, call office if any further incidences of dysphagia 2)  Continue Aciphex as prescribed 3)  Restora  4)  Follow-up with [facility] in 3 months for reassessment   Appended Document: FU OV IN 4-6 WEEKS W/EXTENDER/DYSPHAGIA/SS 3 month reminder w/RMR  is in the computer

## 2010-04-14 NOTE — Progress Notes (Signed)
Summary: sleeping trouble  Phone Note Call from Patient   Summary of Call: pt having trouble sleeping and left ear is hurting. wants to know if doc will call her in something to help her sleep. [facility] [phone]   Initial call taken by: Rudene Anda,  December 02, 2009 1:40 PM  Follow-up for Phone Call        needs to speak with doc collins about this Follow-up by: Syliva Overman MD,  December 02, 2009 5:18 PM  Additional Follow-up for Phone Call Additional follow up Details #1::        patient states she hasnt seen dr Thomasena Edis in almost 7 months, and wont see him until late october Additional Follow-up by: Adella Hare LPN,  December 03, 2009 10:20 AM    Additional Follow-up for Phone Call Additional follow up Details #2::    noted,resp is in other note Follow-up by: Syliva Overman MD,  December 03, 2009 12:13 PM

## 2010-04-14 NOTE — Progress Notes (Signed)
Summary: please advise  Phone Note Call from Patient   Summary of Call: patient called in and states her right side is hurting, with sharp pain and she thinks it is swollen denies fever, nausea.  I advised patient since we had no openings to go to urgent care or ER.  She said okay. Initial call taken by: Curtis Sites,  March 25, 2010 8:50 AM  Follow-up for Phone Call        agree Follow-up by: Syliva Overman MD,  March 25, 2010 12:37 PM

## 2010-04-14 NOTE — Miscellaneous (Signed)
Summary: Orders Update  Clinical Lists Changes  Problems: Added new problem of DIARRHEA (ICD-787.91) Orders: Added new Test order of T-Clostridium difficile Toxin A/B [id number]) - Signed Added new Test order of T-Fecal WBC [phone]) - Signed Added new Test order of T-Stool for O&P ([id number]) - Signed Added new Test order of T-Culture, Stool ([id number]) - Signed  Appended Document: Orders Update Wrote in the diagnosis of diarrhea on the lab sheet.

## 2010-04-14 NOTE — Medication Information (Signed)
Summary: METOCLOPRAMIDE 5MG   METOCLOPRAMIDE 5MG    Imported By: Rexene Alberts 11/19/2009 11:44:41  _____________________________________________________________________  External Attachment:    Type:   Image     Comment:   External Document  Appended Document: METOCLOPRAMIDE 5MG     Prescriptions: METOCLOPRAMIDE HCL 5 MG TABS (METOCLOPRAMIDE HCL) one by mouth qach and qhs  #120 x 3   Entered and Authorized by:   Leanna Battles. Dixon Boos   Signed by:   Leanna Battles Shamyia Grandpre PA-C on 11/19/2009   Method used:   Electronically to        [facility] (retail)       [address]       [place], [place]  [address]       Ph: [phone]       Fax: [phone]   RxID:   [id number]

## 2010-04-14 NOTE — Medication Information (Signed)
Summary: Tax adviser   Imported By: Diana Eves 06/16/2009 12:02:32  _____________________________________________________________________  External Attachment:    Type:   Image     Comment:   External Document  Appended Document: RX Folder-reglan    Prescriptions: METOCLOPRAMIDE HCL 5 MG TABS (METOCLOPRAMIDE HCL) one by mouth qach and qhs  #120 x 3   Entered and Authorized by:   Leanna Battles. Dixon Boos   Signed by:   Leanna Battles Damariz Paganelli PA-C on 06/16/2009   Method used:   Electronically to        [facility] (retail)       [address]       [place], [place]  [address]       Ph: [phone]       Fax: [phone]   RxID:   [id number]

## 2010-04-14 NOTE — Progress Notes (Signed)
Summary: CALL  Phone Note Call from Patient   Summary of Call: WANTS YOU TO CALL HER Initial call taken by: Lind Guest,  Jul 12, 2009 1:29 PM  Follow-up for Phone Call        brandi returned call Follow-up by: Adella Hare LPN,  Jul 12, 1608 1:33 PM     Appended Document: CALL she was in er yesterday morning and her sugar was 407. She was told by the ER doc to call her PCP. I told her it was probably to make F/U app. She is coming in the morning

## 2010-04-14 NOTE — Miscellaneous (Signed)
Summary: Home Care Report  Home Care Report   Imported By: Lind Guest 09/02/2009 14:15:09  _____________________________________________________________________  External Attachment:    Type:   Image     Comment:   External Document

## 2010-04-14 NOTE — Progress Notes (Signed)
Summary: wheel chair  Phone Note Call from Patient   Summary of Call: needs to get a wheel chair.  [facility]  pts states she is having trouble walking and falling.  [phone]  Initial call taken by: Rudene Anda,  April 28, 2009 2:28 PM  Follow-up for Phone Call        pls advise her and set upappt with physical therapy to see if she qualifies for a power wheelchair once that eval is done then i will know what to order Follow-up by: Syliva Overman MD,  April 28, 2009 5:09 PM  Additional Follow-up for Phone Call Additional follow up Details #1::        pt was called about this and left a message.  Additional Follow-up by: Rudene Anda,  April 30, 2009 1:11 PM

## 2010-04-14 NOTE — Progress Notes (Signed)
Summary: note  Phone Note Call from Patient   Summary of Call: needs to get doc to send office note to manager at apartments. because she has a hard time getting things up to her room on second floor. pt states she is trying to get on first floor so it will be easier. [phone] Initial call taken by: Rudene Anda,  April 01, 2009 1:26 PM  Follow-up for Phone Call        pls type a letter stationg pt under my care etc, with name and DOB , also that hse has multiple medical conditions which limit her abilioty to  climb stairs asfely and aground level dddwelling is more appropriate for her on medical grds, I will review and sign, plslet her know it ios being done Follow-up by: Syliva Overman MD,  April 01, 2009 5:18 PM  Additional Follow-up for Phone Call Additional follow up Details #1::        letter typed and provided to doctor Additional Follow-up by: Worthy Keeler LPN,  April 06, 2009 2:57 PM

## 2010-04-14 NOTE — Letter (Signed)
Summary: med review sheet  med review sheet   Imported By: Rudene Anda 12/21/2009 15:53:40  _____________________________________________________________________  External Attachment:    Type:   Image     Comment:   External Document

## 2010-04-14 NOTE — Miscellaneous (Signed)
Summary: Home Care Report  Home Care Report   Imported By: Lind Guest 09/07/2009 11:13:40  _____________________________________________________________________  External Attachment:    Type:   Image     Comment:   External Document

## 2010-04-14 NOTE — Letter (Signed)
Summary: x rays chart 1  x rays chart 1   Imported By: Lind Guest 11/12/2009 08:22:13  _____________________________________________________________________  External Attachment:    Type:   Image     Comment:   External Document

## 2010-04-14 NOTE — Progress Notes (Signed)
  Phone Note Call from Patient   Caller: Patient Summary of Call: patient is requesting a home health nurse to help monitor her medications Initial call taken by: Adella Hare LPN,  June 17, 2009 1:55 PM  Follow-up for Phone Call        refer to same type of program Isabelle Course does , Gayla should qualify Follow-up by: Syliva Overman MD,  June 17, 2009 2:41 PM  Additional Follow-up for Phone Call Additional follow up Details #1::        refered to [facility] Additional Follow-up by: Adella Hare LPN,  June 17, 2009 4:34 PM

## 2010-04-14 NOTE — Progress Notes (Signed)
  Phone Note From Pharmacy   Caller: [facility] Summary of Call: patient is on benazepril and amlodipine please condsider combination of amlod/benaz 5/40 to lessen pill burden and improve compliance Initial call taken by: Adella Hare LPN,  May 24, 2009 4:29 PM  Follow-up for Phone Call        agree with change script written pls advis pt and pharmacy and fax in  Follow-up by: Syliva Overman MD,  May 24, 2009 5:41 PM  Additional Follow-up for Phone Call Additional follow up Details #1::        PATIENT AWARE Additional Follow-up by: Adella Hare LPN,  May 26, 2009 9:06 AM    New/Updated Medications: AMLODIPINE BESY-BENAZEPRIL HCL 5-40 MG CAPS (AMLODIPINE BESY-BENAZEPRIL HCL) Take 1 tablet by mouth once a day Prescriptions: AMLODIPINE BESY-BENAZEPRIL HCL 5-40 MG CAPS (AMLODIPINE BESY-BENAZEPRIL HCL) Take 1 tablet by mouth once a day  #30 x 3   Entered and Authorized by:   Syliva Overman MD   Signed by:   Syliva Overman MD on 05/24/2009   Method used:   Printed then faxed to ...       [facility] (retail)       [address]       [place], [place]  [address]       Ph: [phone]       Fax: [phone]   RxID:   [id number]

## 2010-04-14 NOTE — Progress Notes (Signed)
Summary: KNOT ON HEAD  Phone Note Call from Patient   Summary of Call: KNOT ON HEAD NEAR TEMPLE HURTS SOMETIMES  Initial call taken by: Lind Guest,  April 05, 2010 1:37 PM  Follow-up for Phone Call        if it is a lump she needs a surgeon to look at it to see if it needs to be removed , if its beenm there for a while wait till her next ov so I can see it I did not notice it last time , then decide if she needs to see another doc, about it Follow-up by: Syliva Overman MD,  April 05, 2010 5:00 PM  Additional Follow-up for Phone Call Additional follow up Details #1::        states it is a knot from where she fell and she just wanted to make you aware Additional Follow-up by: Adella Hare LPN,  April 05, 2010 5:07 PM

## 2010-04-14 NOTE — Miscellaneous (Signed)
Summary: Home Care Report  Home Care Report   Imported By: Lind Guest 10/04/2009 08:47:40  _____________________________________________________________________  External Attachment:    Type:   Image     Comment:   External Document

## 2010-04-14 NOTE — Letter (Signed)
Summary: lab  lab   Imported By: Curtis Sites 11/17/2009 16:42:07  _____________________________________________________________________  External Attachment:    Type:   Image     Comment:   External Document

## 2010-04-14 NOTE — Assessment & Plan Note (Signed)
Summary: RECERT   Allergies: 1)  ! * Keflex 2)  ! Pcn 3)  ! Pyridium 4)  * Milk Products   Complete Medication List: 1)  Singulair 10 Mg Tabs (Montelukast sodium) .... One tab by mouth once daily 2)  Zoloft 100 Mg Tabs (Sertraline hcl) .... Two tabs by mouth once daily 3)  Evoxac 30 Mg Caps (Cevimeline hcl) .... One cap by mouth tid 4)  Mirtazapine 30 Mg Tabs (mirtazapine)  .... Take 1 tab by mouth at bedtime 5)  Voltaren 1 % Gel (Diclofenac sodium) .... Apply twice daily as needed to affected areas 6)  Aciphex 20 Mg Tbec (Rabeprazole sodium) .... One by mouth 30 mins before breakfast daily 7)  Levoxyl 50 Mcg Tabs (Levothyroxine sodium) .... Take 1 tablet by mouth once a day 8)  Gabapentin 300 Mg Caps (Gabapentin) .... Take 1 capsule by mouth three times a day 9)  Clonidine Hcl 0.2 Mg Tabs (Clonidine hcl) .... Take 1 tablet by mouth two times a day 10)  Metoprolol Tartrate 25 Mg Tabs (Metoprolol tartrate) .... Take 1 tablet by mouth two times a day 11)  Vitamin D (ergocalciferol) 50000 Unit Caps (Ergocalciferol) .... One cap by mouth q week 12)  Crestor 10 Mg Tabs (Rosuvastatin calcium) .... Take 1 tab by mouth at bedtime 13)  Lamictal 100 Mg Tabs (Lamotrigine) .... One by mouth daily 14)  Amlodipine Besy-benazepril Hcl 5-40 Mg Caps (Amlodipine besy-benazepril hcl) .... Take 1 tablet by mouth once a day 15)  Trazodone Hcl 100 Mg Tabs (Trazodone hcl) .... Take 1 tab by mouth at bedtime 16)  Ativan 1 Mg Tabs (Lorazepam) .... Take 1 tablet by mouth four times a day 17)  Doxepin Hcl 50 Mg Caps (Doxepin hcl) .... Take 1 tablet by mouth three times a day 18)  Metoclopramide Hcl 5 Mg Tabs (Metoclopramide hcl) .... One by mouth qach and qhs 19)  Flexeril 10 Mg Tabs (Cyclobenzaprine hcl) .... Take 1 tablet by mouth two times a day 20)  Hydrocodone-acetaminophen 5-500 Mg Tabs (Hydrocodone-acetaminophen) .... Take 1 tablet by mouth two times a day 21)  Proair Hfa 108 (90 Base) Mcg/act Aers  (Albuterol sulfate) .... 2 puffs at bedtime 22)  Carafate 1 Gm/35ml Susp (Sucralfate) .... 2 teaspoons (10ml) qid 23)  Flonase 50 Mcg/act Susp (Fluticasone propionate) .... 2 sprays once daily 24)  Nystop 100000 Unit/gm Powd (Nystatin) .... Two times a day 25)  Plavix 75 Mg Tabs (Clopidogrel bisulfate) .... Once daily 26)  Miacalcin 200 Unit/ml Soln (Calcitonin (salmon)) .... Once daily 27)  Lomotil 2.5-0.025 Mg Tabs (Diphenoxylate-atropine) .... As needed 28)  Meclizine Hcl 12.5 Mg Tabs (Meclizine hcl) .... Three times a day as needed 29)  Nabumetone 500 Mg Tabs (Nabumetone) .... Once daily 30)  Furosemide 20 Mg Tabs (Furosemide) .... Take 1 tablet by mouth once a day as needed 31)  Klor-con M20 20 Meq Cr-tabs (Potassium chloride crys cr) .... Take 1 tablet by mouth once a day as needed 32)  Glipizide 10 Mg Tabs (Glipizide) .... Take 1 tablet by mouth two times a day 33)  Onglyza 2.5 Mg Tabs (Saxagliptin hcl) .... Take 1 daily  Other Orders: Re-certification of Home Health [phone])

## 2010-04-14 NOTE — Letter (Signed)
Summary: Internal Other  Internal Other   Imported By: Peggyann Shoals 09/23/2009 11:09:27  _____________________________________________________________________  External Attachment:    Type:   Image     Comment:   External Document

## 2010-04-14 NOTE — Miscellaneous (Signed)
Summary: Home Care Report  Home Care Report   Imported By: Lind Guest 08/27/2009 08:32:46  _____________________________________________________________________  External Attachment:    Type:   Image     Comment:   External Document

## 2010-04-14 NOTE — Letter (Signed)
Summary: BARIUM SWALLOW ORDER  BARIUM SWALLOW ORDER   Imported By: Ave Filter 03/25/2009 17:15:52  _____________________________________________________________________  External Attachment:    Type:   Image     Comment:   External Document

## 2010-04-14 NOTE — Assessment & Plan Note (Signed)
Summary: BP CK  Nurse Visit   Vital Signs:  Patient profile:   60 year old female Menstrual status:  hysterectomy Weight:      170.8 pounds BP sitting:   170 / 80  (right arm)  Allergies: 1)  ! * Keflex 2)  ! Pcn 3)  ! Pyridium 4)  * Milk Products Start hctz 12.5mg  one daily

## 2010-04-14 NOTE — Letter (Signed)
Summary: external records  external records   Imported By: Curtis Sites 11/02/2009 08:46:50  _____________________________________________________________________  External Attachment:    Type:   Image     Comment:   External Document

## 2010-04-14 NOTE — Progress Notes (Signed)
  Phone Note Other Incoming   Caller: gi Summary of Call: this pt will need to start reclast, pls fill in one of the referral forms as you are able, also pls print last dexa, GI states unable to swallow tabs for osteioperosis  Initial call taken by: Syliva Overman MD,  March 22, 2009 12:53 PM  Follow-up for Phone Call        we do not have referal forms for this, im guessing they got discarded durning the move since we never use them.  how should i procede without the form? Follow-up by: Worthy Keeler LPN,  March 22, 2009 1:40 PM  Additional Follow-up for Phone Call Additional follow up Details #1::        casll Arline Asp with [facility] she has the forms Additional Follow-up by: Syliva Overman MD,  March 22, 2009 2:17 PM    Additional Follow-up for Phone Call Additional follow up Details #2::    called cindy, left message Follow-up by: Worthy Keeler LPN,  March 22, 2009 3:19 PM  Additional Follow-up for Phone Call Additional follow up Details #3:: Details for Additional Follow-up Action Taken: called for reclast referal sheet from specialty clinic Additional Follow-up by: Worthy Keeler LPN,  March 24, 2009 1:53 PM

## 2010-04-14 NOTE — Medication Information (Signed)
Summary: Tax adviser   Imported By: Rosine Beat 09/01/2009 10:45:44  _____________________________________________________________________  External Attachment:    Type:   Image     Comment:   External Document

## 2010-04-14 NOTE — Progress Notes (Signed)
Summary: CALL  Phone Note Call from Patient   Summary of Call: CALL HER BACK Initial call taken by: Lind Guest,  December 15, 2009 3:05 PM  Follow-up for Phone Call        Patient has appointment today with PA  Follow-up by: Everitt Amber LPN,  December 16, 2009 12:13 PM

## 2010-04-14 NOTE — Letter (Signed)
Summary: [facility]  [facility]   Imported By: Rexene Alberts 11/12/2009 11:26:29  _____________________________________________________________________  External Attachment:    Type:   Image     Comment:   External Document

## 2010-04-14 NOTE — Progress Notes (Signed)
Summary: CALL HER  Phone Note Call from Patient   Summary of Call: WANTS YOU TO CALL HER ABOUT DIAH. OR SOMETHING??? Initial call taken by: Lind Guest,  September 20, 2009 8:05 AM  Follow-up for Phone Call        states bowels are very loose like water Follow-up by: Adella Hare LPN,  September 20, 2009 2:12 PM  Additional Follow-up for Phone Call Additional follow up Details #1::        pls document more detyails..#of stool per day , bood/mucus in stool, is she urinating ok and duration of symptoms,any  fever or chills.  Give std bFRAT diet advice and advise oTC immodium for use as directed(she does not need to have lomotil called in) If nt alarming symptoms let me know  Additional Follow-up by: Syliva Overman MD,  September 21, 2009 7:53 AM    Additional Follow-up for Phone Call Additional follow up Details #2::    urinating okay, no blood in stool, going several times a day  requesting lomotil  also requesting refill on tessalon pearles Follow-up by: Adella Hare LPN,  September 21, 2009 8:51 AM  Additional Follow-up for Phone Call Additional follow up Details #3:: Details for Additional Follow-up Action Taken: she needs to take the immodium and havbe that fail, lomotil isa resrticted drug, also refill tessal;on perles x 2 pls Additional Follow-up by: Syliva Overman MD,  September 21, 2009 12:04 PM  New/Updated Medications: TESSALON PERLES 100 MG CAPS (BENZONATATE) one cap by mouth three times a day Prescriptions: TESSALON PERLES 100 MG CAPS (BENZONATATE) one cap by mouth three times a day  #30 x 1   Entered by:   Adella Hare LPN   Authorized by:   Esperanza Sheets PA   Signed by:   Adella Hare LPN on 16/12/9602   Method used:   Electronically to        [facility] (retail)       [address]       [place], [place]  [address]       Ph: [phone]       Fax: [phone]   RxID:   [id number]  patient aware

## 2010-04-14 NOTE — Progress Notes (Signed)
Summary: NEEDS A LETTER  Phone Note Call from Patient   Summary of Call: NEEDS A LETTER STATING THAT SHE IS HANDICAPP AND SOMETHING ABOUT THE FIRST FLOOR  WILL PICK UP TUESDAY Initial call taken by: Adella Hare LPN,  October 22, 2009 11:02 AM  Follow-up for Phone Call        i assume she needs a statement that she is handicapped and needs apt on first floor, i will type and put in your box if this is okay Follow-up by: Adella Hare LPN,  October 22, 2009 11:03 AM  Additional Follow-up for Phone Call Additional follow up Details #1::        yes ok, in letter put due to her chronic medical conditions 9not that she is handicapped pls, thanks Additional Follow-up by: Syliva Overman MD,  October 22, 2009 11:58 AM    Additional Follow-up for Phone Call Additional follow up Details #2::    noted Follow-up by: Adella Hare LPN,  October 26, 2009 11:16 AM

## 2010-04-14 NOTE — Progress Notes (Signed)
Summary: DIZZY  Phone Note Call from Patient   Summary of Call: STILL DIZZY CAN NOT SEE THE ENT UNTIL 9.6.11 AND SHE USED TO HAVE SOMETHING FOR DIZZINESS JUST HERE LAST WEEK FOR THE DIZZINESS CALL BACK AT [phone] Initial call taken by: Lind Guest,  November 08, 2009 10:57 AM  Follow-up for Phone Call        Meclizine refilled for her.  Keep appt with ENT as planned. Follow-up by: Esperanza Sheets PA,  November 08, 2009 11:48 AM  Additional Follow-up for Phone Call Additional follow up Details #1::        patient aware Additional Follow-up by: Adella Hare LPN,  November 08, 2009 12:06 PM    New/Updated Medications: MECLIZINE HCL 12.5 MG TABS (MECLIZINE HCL) take one three times a day as needed for dizziness Prescriptions: MECLIZINE HCL 12.5 MG TABS (MECLIZINE HCL) take one three times a day as needed for dizziness  #30 x 0   Entered and Authorized by:   Esperanza Sheets PA   Signed by:   Esperanza Sheets PA on 11/08/2009   Method used:   Electronically to        [facility] (retail)       [address]       [place], [place]  [address]       Ph: [phone]       Fax: [phone]   RxID:   [id number]

## 2010-04-14 NOTE — Assessment & Plan Note (Signed)
Summary: DROPPED OFF STOOL/SS   Pt returned one iFOBT and it was positive.      Allergies: 1)  ! * Keflex 2)  ! Pcn 3)  ! Pyridium 4)  * Milk Products  Other Orders: Immuno-chemical Fecal Occult ([id number])  Appended Document: DROPPED OFF STOOL/SS will see 7/11; probably hneeds a tcs w +- capsule to follow; will decide when she returns next month

## 2010-04-14 NOTE — Letter (Signed)
Summary: BARIUM SWALLOW  BARIUM SWALLOW   Imported By: Diana Eves 05/26/2009 16:38:48  _____________________________________________________________________  External Attachment:    Type:   Image     Comment:   External Document

## 2010-04-14 NOTE — Assessment & Plan Note (Signed)
Summary: back pain - room 1   Vital Signs:  Patient profile:   60 year old female Menstrual status:  hysterectomy Height:      62 inches Weight:      175.50 pounds BMI:     32.22 O2 Sat:      97 % on Room air Pulse rate:   107 / minute Resp:     16 per minute BP sitting:   170 / 80  (left arm)  Vitals Entered By: Adella Hare LPN (May 19, 5782 1:42 PM)  Serial Vital Signs/Assessments:  Time      Position  BP       Pulse  Resp  Temp     By                     160/90                         Esperanza Sheets PA  CC: lower back pain Is Patient Diabetic? Yes Did you bring your meter with you today? No Pain Assessment Patient in pain? yes     Location: lower back Intensity: 8 Type: aching Onset of pain  Intermittent   Referring Provider:  Dr. Lodema Hong Primary Provider:  Lodema Hong  CC:  lower back pain.  History of Present Illness: Pt comes in today with c/o ongoing Rt lower back pain down to Rt buttock area.  Appt with back dr 4/8 (neurosurg).  She was previously on pain medications & patches but hasn't had any for awhile.  She is only using Tylenol at this time.  She tried epidurals & they did not help, so she has been referred to neurosurg.  Pt states the pain is constant.  Not sleeping well.  No change bowel or bladder control.  No numbess or tingling in RLE.  Pt was seen 05/12/09 & wasn't taking her medications.  She states that she has been taking all of her meds since she was last here.  Is also seeing her GI dr.   Current Medications (verified): 1)  Singulair 10 Mg  Tabs (Montelukast Sodium) .... One Tab By Mouth Once Daily 2)  Zoloft 100 Mg  Tabs (Sertraline Hcl) .... Two Tabs By Mouth Once Daily 3)  Dicyclomine Hcl 20 Mg  Tabs (Dicyclomine Hcl) .... Take 1 Tab 3 Times Daily 4)  Evoxac 30 Mg Caps (Cevimeline Hcl) .... One Cap By Mouth Tid 5)  Boniva 150 Mg Tabs (Ibandronate Sodium) .... One Tab By Mouth Every Month 6)  Mirtazapine 30 Mg Tabs (Mirtazapine) .... Take 1  Tab By Mouth At Bedtime 7)  Proair Hfa 108 (90 Base) Mcg/act Aers (Albuterol Sulfate) .... Two Puffs Qhs 8)  Tylenol 325 Mg Tabs (Acetaminophen) .... Take 1 Tablet By Mouth Three Times A Day 9)  Zofran 4 Mg Tabs (Ondansetron Hcl) .... Take 1 Tablet By Mouth Once A Day As Needed 10)  Voltaren 1 % Gel (Diclofenac Sodium) .... Apply Twice Daily As Needed To Affected Areas 11)  Plavix 75 Mg Tabs (Clopidogrel Bisulfate) .... Once Daily 12)  Aciphex 20 Mg Tbec (Rabeprazole Sodium) .... One By Mouth Two Times A Day 30 Mins Before A Meal (Breakfast and Evening Meal) 13)  Nystop 100000 Unit/gm Powd (Nystatin) .... Apply To Affected Areas Twice Daily 14)  Lotensin 40 Mg Tabs (Benazepril Hcl) .... Take 1 Tablet By Mouth Once A Day 15)  Levoxyl 50 Mcg Tabs (Levothyroxine Sodium) .Marland KitchenMarland KitchenMarland Kitchen  Take 1 Tablet By Mouth Once A Day 16)  Flonase 50 Mcg/act Susp (Fluticasone Propionate) .... Two Sprays Nostril Daily 17)  Gabapentin 300 Mg Caps (Gabapentin) .... Take 1 Capsule By Mouth Three Times A Day 18)  Clonidine Hcl 0.2 Mg Tabs (Clonidine Hcl) .... Take 1 Tablet By Mouth Two Times A Day 19)  Metoprolol Tartrate 25 Mg Tabs (Metoprolol Tartrate) .... Take 1 Tablet By Mouth Two Times A Day 20)  Vitamin D (Ergocalciferol) 50000 Unit Caps (Ergocalciferol) .... One Cap By Mouth Q Week 21)  Robaxin 500 Mg Tabs (Methocarbamol) .... One By Mouth Q 6 Hrs 22)  Catapres-Tts-3 0.3 Mg/24hr Ptwk (Clonidine Hcl) .... Apply One Patch Weekly 23)  Amlodipine Besylate 5 Mg Tabs (Amlodipine Besylate) .... Take 1 Tablet By Mouth Once A Day 24)  Lomotil 2.5-0.025 Mg Tabs (Diphenoxylate-Atropine) .... Take 1 Tablet By Mouth Four Times A Day As Needed 25)  Glipizide 2.5 Mg Xr24h-Tab (Glipizide) .... 2 Tablets in The Morning At Breakfast and 1 Tablet At Suppertime 26)  Antivert 12.5 Mg Tabs (Meclizine Hcl) .... Take 1 Tablet By Mouth Three Times A Day As Needed 27)  Crestor 10 Mg Tabs (Rosuvastatin Calcium) .... Take 1 Tab By Mouth At  Bedtime 28)  Lamictal 100 Mg Tabs (Lamotrigine) .... One By Mouth Daily 29)  Doxycycline Hyclate 100 Mg Caps (Doxycycline Hyclate) .... Take 1 Capsule By Mouth Two Times A Day 30)  Tessalon Perles 100 Mg Caps (Benzonatate) .... Take 1 Capsule By Mouth Three Times A Day 31)  Aurodex 1.4-5.4 % Soln (Benzocaine-Antipyrine) .... 2 Drops in Affected Ear(S) Three Times Daily As Needed 32)  Doxepin Hcl 50 Mg Caps (Doxepin Hcl) .[name].. 1 Cap At Bedtime X 3 Days, Then 1 Cap Two Times A Day X 3 Days, Then 1 Cap Once Daily 33)  Septra Ds 800-160 Mg Tabs (Sulfamethoxazole-Trimethoprim) .... One Tablet Twice Daily 34)  Fluconazole 150 Mg Tabs (Fluconazole) .... Take 1 Tablet By Mouth Once A Day As Needed 35)  Glipizide 2.5 Mg Xr24h-Tab (Glipizide) .... Two Tablets Twice Daily For 2 Weeks, Starting 04/23/2009, Then Resume Previous Dose 36)  Calcitonin (Salmon) 200 Unit/act Soln (Calcitonin (Salmon)) .... 200 Units Once Daily To Be Sprayed in The Nostril, Alternating The Nostrils  Allergies (verified): 1)  ! * Keflex 2)  ! Pcn 3)  ! Pyridium 4)  * Milk Products  Past History:  Past medical history reviewed for relevance to current acute and chronic problems.  Past Medical History: Reviewed history from 02/22/2009 and no changes required. Bipolar d/o CVA H/O pancreatitis due to Depakote with normal EUS in 2006 OSTEOPOROSIS (ICD-733.00) SLEEP APNEA (ICD-780.57) BACK PAIN, CHRONIC (ICD-724.5) NAUSEA, CHRONIC (ICD-787.02) DIABETES MELLITUS (ICD-250.00) TRIGGER FINGER (ICD-727.3) Anxiety Disorder Hypertension Migraines Simple adenomas and hyperplastic polyps on TCS in 2001 Last TCS was in 9/08 by Dr. Lina Sar for diarrhea.  Bx negative for microscopic colitits.  She had diverticulsis.   Glaucoma  Review of Systems General:  Complains of fatigue. CV:  Denies chest pain or discomfort. Resp:  Denies shortness of breath. GI:  Denies change in bowel habits. MS:  Complains of low back pain and  cramps; denies joint pain. Neuro:  Denies numbness and tingling.  Physical Exam  General:  Well-developed,well-nourished,in no acute distress; alert,appropriate and cooperative throughout examination Head:  Normocephalic and atraumatic without obvious abnormalities. No apparent alopecia or balding. Lungs:  Normal respiratory effort, chest expands symmetrically. Lungs are clear to auscultation, no crackles or wheezes. Heart:  Normal  rate and regular rhythm. S1 and S2 normal without gallop, murmur, click, rub or other extra sounds. Msk:  + TTP Rt L5 area, SI joint & sciatic notch.  Rt hip is nontender to palp. Pulses:  R posterior tibial normal, R dorsalis pedis normal, L posterior tibial normal, and L dorsalis pedis normal.   Extremities:  No clubbing, cyanosis, edema, or deformity noted with normal full range of motion of all joints.     Impression & Recommendations:  Problem # 1:  BACK PAIN WITH RADICULOPATHY (ICD-729.2) Assessment Unchanged Keep appt with Neursurg. Use ice to area as needed. Will use hydrocodone for pain until see's neurosurg.  Advised pt that prescription is to last until she see's surg in 1 mos.  Problem # 2:  HYPERTENSION (ICD-401.9) Assessment: Deteriorated Pt states that she is taking her medications daily, though 1 wk ago she wasn't. Will not adjust her meds at this time & re - eval BP at next appt.  Her updated medication list for this problem includes:    Lotensin 40 Mg Tabs (Benazepril hcl) .[name]... Take 1 tablet by mouth once a day    Clonidine Hcl 0.2 Mg Tabs (Clonidine hcl) .[name]... Take 1 tablet by mouth two times a day    Metoprolol Tartrate 25 Mg Tabs (Metoprolol tartrate) .[name]... Take 1 tablet by mouth two times a day    Catapres-tts-3 0.3 Mg/24hr Ptwk (Clonidine hcl) .[name]... Apply one patch weekly    Amlodipine Besylate 5 Mg Tabs (Amlodipine besylate) .[name]... Take 1 tablet by mouth once a day  BP today: 170/80 Prior BP: 140/78 (05/12/2009)  Labs  Reviewed: K+: 4.4 (12/28/2008) Creat: : 0.98 (04/13/2009)   Chol: 135 (03/18/2009)   HDL: 46 (03/18/2009)   LDL: 69 (03/18/2009)   TG: 101 (03/18/2009)  Complete Medication List: 1)  Singulair 10 Mg Tabs (Montelukast sodium) .... One tab by mouth once daily 2)  Zoloft 100 Mg Tabs (Sertraline hcl) .... Two tabs by mouth once daily 3)  Dicyclomine Hcl 20 Mg Tabs (Dicyclomine hcl) .... Take 1 tab 3 times daily 4)  Evoxac 30 Mg Caps (Cevimeline hcl) .... One cap by mouth tid 5)  Boniva 150 Mg Tabs (Ibandronate sodium) .... One tab by mouth every month 6)  Mirtazapine 30 Mg Tabs (mirtazapine)  .... Take 1 tab by mouth at bedtime 7)  Proair Hfa 108 (90 Base) Mcg/act Aers (Albuterol sulfate) .... Two puffs qhs 8)  Tylenol 325 Mg Tabs (Acetaminophen) .... Take 1 tablet by mouth three times a day 9)  Zofran 4 Mg Tabs (Ondansetron hcl) .... Take 1 tablet by mouth once a day as needed 10)  Voltaren 1 % Gel (Diclofenac sodium) .... Apply twice daily as needed to affected areas 11)  Plavix 75 Mg Tabs (Clopidogrel bisulfate) .... Once daily 12)  Aciphex 20 Mg Tbec (Rabeprazole sodium) .... One by mouth two times a day 30 mins before a meal (breakfast and evening meal) 13)  Nystop 100000 Unit/gm Powd (Nystatin) .... Apply to affected areas twice daily 14)  Lotensin 40 Mg Tabs (Benazepril hcl) .... Take 1 tablet by mouth once a day 15)  Levoxyl 50 Mcg Tabs (Levothyroxine sodium) .... Take 1 tablet by mouth once a day 16)  Flonase 50 Mcg/act Susp (Fluticasone propionate) .... Two sprays nostril daily 17)  Gabapentin 300 Mg Caps (Gabapentin) .... Take 1 capsule by mouth three times a day 18)  Clonidine Hcl 0.2 Mg Tabs (Clonidine hcl) .... Take 1 tablet by mouth two times  a day 19)  Metoprolol Tartrate 25 Mg Tabs (Metoprolol tartrate) .... Take 1 tablet by mouth two times a day 20)  Vitamin D (ergocalciferol) 50000 Unit Caps (Ergocalciferol) .... One cap by mouth q week 21)  Robaxin 500 Mg Tabs (Methocarbamol)  .... One by mouth q 6 hrs 22)  Catapres-tts-3 0.3 Mg/24hr Ptwk (Clonidine hcl) .... Apply one patch weekly 23)  Amlodipine Besylate 5 Mg Tabs (Amlodipine besylate) .... Take 1 tablet by mouth once a day 24)  Lomotil 2.5-0.025 Mg Tabs (Diphenoxylate-atropine) .... Take 1 tablet by mouth four times a day as needed 25)  Glipizide 2.5 Mg Xr24h-tab (Glipizide) .... 2 tablets in the morning at breakfast and 1 tablet at suppertime 26)  Antivert 12.5 Mg Tabs (Meclizine hcl) .... Take 1 tablet by mouth three times a day as needed 27)  Crestor 10 Mg Tabs (Rosuvastatin calcium) .... Take 1 tab by mouth at bedtime 28)  Lamictal 100 Mg Tabs (Lamotrigine) .... One by mouth daily 29)  Doxycycline Hyclate 100 Mg Caps (Doxycycline hyclate) .... Take 1 capsule by mouth two times a day 30)  Tessalon Perles 100 Mg Caps (Benzonatate) .... Take 1 capsule by mouth three times a day 31)  Aurodex 1.4-5.4 % Soln (Benzocaine-antipyrine) .... 2 drops in affected ear(s) three times daily as needed 32)  Doxepin Hcl 50 Mg Caps (Doxepin hcl) .[name].. 1 cap at bedtime x 3 days, then 1 cap two times a day x 3 days, then 1 cap once daily 33)  Septra Ds 800-160 Mg Tabs (Sulfamethoxazole-trimethoprim) .... One tablet twice daily 34)  Fluconazole 150 Mg Tabs (Fluconazole) .... Take 1 tablet by mouth once a day as needed 35)  Glipizide 2.5 Mg Xr24h-tab (Glipizide) .... Two tablets twice daily for 2 weeks, starting 04/23/2009, then resume previous dose 36)  Calcitonin (salmon) 200 Unit/act Soln (Calcitonin (salmon)) .... 200 units once daily to be sprayed in the nostril, alternating the nostrils 37)  Hydrocodone-acetaminophen 5-500 Mg Tabs (Hydrocodone-acetaminophen) .[name].. 1 two times a day as needed back pain  Patient Instructions: 1)  Keep appt with neurosurgeon. 2)  I have prescribed Hydrocodone for your pain.  You may take this twice a day as needed for pain.  The prescription you have should last you at least 1 month. 3)  Please  schedule a follow-up appointment in 1 month. Prescriptions: HYDROCODONE-ACETAMINOPHEN 5-500 MG TABS (HYDROCODONE-ACETAMINOPHEN) 1 two times a day as needed back pain  #60 x 0   Entered and Authorized by:   Esperanza Sheets PA   Signed by:   Esperanza Sheets PA on 05/18/2009   Method used:   Printed then faxed to ...       [facility] (retail)       [address]       [place], [place]  [address]       Ph: [phone]       Fax: [phone]   RxID:   [id number]

## 2010-04-14 NOTE — Progress Notes (Signed)
Summary: [facility] Referal  Phone Note Call from Patient Call back at Home Phone [phone]   Caller: Patient Reason for Call: Referral Action Taken: Provider Notified Summary of Call: Pt called and stated she didn't want to have a referral to [facility]  Please advise? Initial call taken by: Ave Filter,  May 03, 2009 12:31 PM

## 2010-04-14 NOTE — Letter (Signed)
Summary: xray  xray   Imported By: Curtis Sites 11/17/2009 16:42:57  _____________________________________________________________________  External Attachment:    Type:   Image     Comment:   External Document

## 2010-04-14 NOTE — Assessment & Plan Note (Signed)
Summary: office visit   Vital Signs:  Patient profile:   60 year old female Menstrual status:  hysterectomy Height:      62 inches Weight:      173.75 pounds BMI:     31.89 O2 Sat:      95 % on Room air Pulse rate:   74 / minute Pulse rhythm:   regular Resp:     16 per minute BP sitting:   124 / 70  (left arm)  Vitals Entered By: Mauricia Area, CMA  Nutrition Counseling: Patient's BMI is greater than 25 and therefore counseled on weight management options.  O2 Flow:  Room air CC: follow up   Primary Care Provider:  Shalon Councilman  CC:  follow up.  History of Present Illness: pt continues to c/o uncontrolledback pain radiating to the lower extremities. She has had consultation through the pain clinic , and reports she was told nothing more could be done. She also c/o poor sleep, both falling and staying asleep, which has been a chronic complaint, she does have an upcoming appt with her psychiatrist, who i defer mx of thisto. She denies any recentr fever or chills. She denies head or chest congestion, dysuria or frequency. She has had no recent falls. she reports that at times she does feel severely depressed and like giving up, but vehemently denies any suicidal ideation. She also denies hallucinations.  Current Medications (verified): 1)  Singulair 10 Mg  Tabs (Montelukast Sodium) .... One Tab By Mouth Once Daily 2)  Zoloft 100 Mg  Tabs (Sertraline Hcl) .... Two Tabs By Mouth Once Daily 3)  Evoxac 30 Mg Caps (Cevimeline Hcl) .... One Cap By Mouth Tid 4)  Mirtazapine 30 Mg Tabs (Mirtazapine) .... Take 1 Tab By Mouth At Bedtime 5)  Levoxyl 50 Mcg Tabs (Levothyroxine Sodium) .... Take 1 Tablet By Mouth Once A Day 6)  Metoprolol Tartrate 25 Mg Tabs (Metoprolol Tartrate) .... Take 1 Tablet By Mouth Two Times A Day 7)  Crestor 10 Mg Tabs (Rosuvastatin Calcium) .... Take 1 Tab By Mouth At Bedtime 8)  Lamictal 100 Mg Tabs (Lamotrigine) .... One By Mouth Daily 9)  Amlodipine  Besy-Benazepril Hcl 5-40 Mg Caps (Amlodipine Besy-Benazepril Hcl) .... Take 1 Tablet By Mouth Once A Day 10)  Trazodone Hcl 100 Mg Tabs (Trazodone Hcl) .... Take 1 Tab By Mouth At Bedtime 11)  Ativan 1 Mg Tabs (Lorazepam) .... Take 1 Tablet By Mouth Four Times A Day 12)  Metoclopramide Hcl 5 Mg Tabs (Metoclopramide Hcl) .... One By Mouth Qach and Qhs 13)  Flexeril 10 Mg Tabs (Cyclobenzaprine Hcl) .... Take 1 Tablet By Mouth Two Times A Day 14)  Hydrocodone-Acetaminophen 5-500 Mg Tabs (Hydrocodone-Acetaminophen) .... Take 1 Tablet By Mouth Two Times A Day 15)  Plavix 75 Mg Tabs (Clopidogrel Bisulfate) .... Once Daily 16)  Miacalcin 200 Unit/ml Soln (Calcitonin (Salmon)) .... Once Daily 17)  Lomotil 2.5-0.025 Mg Tabs (Diphenoxylate-Atropine) .... As Needed 18)  Glipizide 10 Mg Tabs (Glipizide) .... Take 1 Tablet By Mouth Two Times A Day 19)  Zofran 4 Mg Tabs (Ondansetron Hcl) .... Take 1 Tablet By Mouth Once A Day As Needed Severe Nausea 20)  Onglyza 5 Mg Tabs (Saxagliptin Hcl) .... Take 1 Tablet By Mouth Once A Day 21)  Aciphex 20 Mg Tbec (Rabeprazole Sodium) .... Once Daily 22)  Nystop 100000 Unit/gm Powd (Nystatin) .... Two Times A Day 23)  Furosemide 20 Mg Tabs (Furosemide) .... Once Daily As Needed 24)  Klor-Con  M20 20 Meq Cr-Tabs (Potassium Chloride Crys Cr) .... As Needed With Furosemide 25)  Vitamin D 50000u .... Q Week 26)  Doxycycline Hyclate 100 Mg Caps (Doxycycline Hyclate) .... Take 1 Two Times A Day X 10 Days 27)  Clonidine Hcl 0.3 Mg Tabs (Clonidine Hcl) .... Take 1 Tablet By Mouth Two Times A Day 28)  Meclizine Hcl 12.5 Mg Tabs (Meclizine Hcl) .... Take One Three Times A Day As Needed For Dizziness  Allergies (verified): 1)  ! * Keflex 2)  ! Pcn 3)  ! Pyridium 4)  * Milk Products  Review of Systems      See HPI General:  Complains of sleep disorder; poor sleep has appt with psych in 2 weeks, has not seen him for 6 mths approx. Eyes:  Denies discharge, eye pain, and red  eye. GI:  Denies abdominal pain, constipation, diarrhea, nausea, and vomiting. MS:  Complains of low back pain; uncontrolled back pain radiating to left lower quadrant. Derm:  Denies itching, lesion(s), and rash. Neuro:  Complains of headaches, memory loss, poor balance, and weakness. Endo:  Denies excessive thirst and excessive urination; tests daily fastiong sugars avg around 130, . Heme:  Denies abnormal bruising and bleeding. Allergy:  Complains of seasonal allergies.  Physical Exam  General:  Well-developed,well-nourished,in no acute distress; alert,appropriate and cooperative throughout examination HEENT: No facial asymmetry,  EOMI, No sinus tenderness, TM's Clear, oropharynx  pink and moist.   Chest: Clear to auscultation bilaterally.  CVS: S1, S2, No murmurs, No S3.   Abd: Soft, Nontender.  MS: decreased  ROM spine,adequate in  hips, shoulders and knees.  Ext: No edema.   CNS: CN 2-12 intact, power tone and sensation normal throughout.   Skin: Intact, no visible lesions or rashes.  Psych: Good eye contact, normal affect.  Memory losst, not anxious or depressed appearing.    Impression & Recommendations:  Problem # 1:  HYPERLIPIDEMIA (ICD-272.4) Assessment Comment Only  Her updated medication list for this problem includes:    Crestor 10 Mg Tabs (Rosuvastatin calcium) .[name]... Take 1 tab by mouth at bedtime Low fat dietdiscussed and encouraged  Labs Reviewed: SGOT: 22 (09/14/2009)   SGPT: 21 (09/14/2009)   HDL:43 (09/14/2009), 46 (03/18/2009)  LDL:73 (09/14/2009), 69 (03/18/2009)  Chol:158 (09/14/2009), 135 (03/18/2009)  Trig:211 (09/14/2009), 101 (03/18/2009)  Problem # 2:  GERD (ICD-530.81) Assessment: Improved  Her updated medication list for this problem includes:    Aciphex 20 Mg Tbec (Rabeprazole sodium) ..... Once daily  Problem # 3:  BACK PAIN WITH RADICULOPATHY (ICD-729.2) Assessment: Deteriorated pt to discuss with current neurologist pain mx, whio sh sees  for headaches  Problem # 4:  INSOMNIA (ICD-780.52) Assessment: Deteriorated  Discussed sleep hygiene. Psych to prescribe meds if deemed necessary  Problem # 5:  DIABETES MELLITUS (ICD-250.00) Assessment: Comment Only  Her updated medication list for this problem includes:    Amlodipine Besy-benazepril Hcl 5-40 Mg Caps (Amlodipine besy-benazepril hcl) .[name]... Take 1 tablet by mouth once a day    Glipizide 10 Mg Tabs (Glipizide) .[name]... Take 1 tablet by mouth two times a day    Onglyza 5 Mg Tabs (Saxagliptin hcl) .[name]... Take 1 tablet by mouth once a day Patient advised to reduce carbs and sweets, commit to regular physical activity, take meds as prescribed, test blood sugars as directed, and attempt to lose weight , to improve blood sugar control.  Orders: T- Hemoglobin A1C ([id number])  Labs Reviewed: Creat: 0.67 (09/14/2009)    Reviewed HgBA1c  results: 8.2 (09/14/2009)  7.4 (06/08/2009)  Problem # 6:  HYPERTENSION (ICD-401.9) Assessment: Improved  Her updated medication list for this problem includes:    Metoprolol Tartrate 25 Mg Tabs (Metoprolol tartrate) .[name]... Take 1 tablet by mouth two times a day    Amlodipine Besy-benazepril Hcl 5-40 Mg Caps (Amlodipine besy-benazepril hcl) .[name]... Take 1 tablet by mouth once a day    Furosemide 20 Mg Tabs (Furosemide) ..... Once daily as needed    Clonidine Hcl 0.3 Mg Tabs (Clonidine hcl) .[name]... Take 1 tablet by mouth two times a day  Orders: T-Basic Metabolic Panel [phone])  BP today: 124/70 Prior BP: 140/80 (12/16/2009)  Labs Reviewed: K+: 4.3 (09/14/2009) Creat: : 0.67 (09/14/2009)   Chol: 158 (09/14/2009)   HDL: 43 (09/14/2009)   LDL: 73 (09/14/2009)   TG: 211 (09/14/2009)  Complete Medication List: 1)  Singulair 10 Mg Tabs (Montelukast sodium) .... One tab by mouth once daily 2)  Zoloft 100 Mg Tabs (Sertraline hcl) .... Two tabs by mouth once daily 3)  Evoxac 30 Mg Caps (Cevimeline hcl) .... One cap by mouth tid 4)  Mirtazapine  30 Mg Tabs (mirtazapine)  .... Take 1 tab by mouth at bedtime 5)  Levoxyl 50 Mcg Tabs (Levothyroxine sodium) .... Take 1 tablet by mouth once a day 6)  Metoprolol Tartrate 25 Mg Tabs (Metoprolol tartrate) .... Take 1 tablet by mouth two times a day 7)  Crestor 10 Mg Tabs (Rosuvastatin calcium) .... Take 1 tab by mouth at bedtime 8)  Lamictal 100 Mg Tabs (Lamotrigine) .... One by mouth daily 9)  Amlodipine Besy-benazepril Hcl 5-40 Mg Caps (Amlodipine besy-benazepril hcl) .... Take 1 tablet by mouth once a day 10)  Trazodone Hcl 100 Mg Tabs (Trazodone hcl) .... Take 1 tab by mouth at bedtime 11)  Ativan 1 Mg Tabs (Lorazepam) .... Take 1 tablet by mouth four times a day 12)  Metoclopramide Hcl 5 Mg Tabs (Metoclopramide hcl) .... One by mouth qach and qhs 13)  Flexeril 10 Mg Tabs (Cyclobenzaprine hcl) .... Take 1 tablet by mouth two times a day 14)  Plavix 75 Mg Tabs (Clopidogrel bisulfate) .... Once daily 15)  Miacalcin 200 Unit/ml Soln (Calcitonin (salmon)) .... Once daily 16)  Lomotil 2.5-0.025 Mg Tabs (Diphenoxylate-atropine) .... As needed 17)  Glipizide 10 Mg Tabs (Glipizide) .... Take 1 tablet by mouth two times a day 18)  Zofran 4 Mg Tabs (Ondansetron hcl) .... Take 1 tablet by mouth once a day as needed severe nausea 19)  Onglyza 5 Mg Tabs (Saxagliptin hcl) .... Take 1 tablet by mouth once a day 20)  Aciphex 20 Mg Tbec (Rabeprazole sodium) .... Once daily 21)  Nystop 100000 Unit/gm Powd (Nystatin) .... Two times a day 22)  Furosemide 20 Mg Tabs (Furosemide) .... Once daily as needed 23)  Klor-con M20 20 Meq Cr-tabs (Potassium chloride crys cr) .... As needed with furosemide 24)  Vitamin D 50000u  .... Q week 25)  Doxycycline Hyclate 100 Mg Caps (Doxycycline hyclate) .... Take 1 two times a day x 10 days 26)  Clonidine Hcl 0.3 Mg Tabs (Clonidine hcl) .... Take 1 tablet by mouth two times a day 27)  Meclizine Hcl 12.5 Mg Tabs (Meclizine hcl) .... Take one three times a day as needed for  dizziness  Patient Instructions: 1)  Please schedule a follow-up appointment in 3 months. 2)  HbgA1C prior to visit, ICD-9:  today , and chem 7  3)  Meds as discussed

## 2010-04-14 NOTE — Miscellaneous (Signed)
Summary: labs needed for reclast  Clinical Lists Changes  Orders: Added new Test order of T-Creatinine blood clearance [phone]) - Signed Added new Test order of T-Calcium  ([id number]) - Signed

## 2010-04-14 NOTE — Medication Information (Signed)
Summary: LIDOCAINE 2%  LIDOCAINE 2%   Imported By: Rexene Alberts 01/19/2010 12:06:04  _____________________________________________________________________  External Attachment:    Type:   Image     Comment:   External Document  Appended Document: LIDOCAINE 2% spoke with pt. this was d/c'ed sept 2010. please let pharmacy know we are not refilling at this time and the pt is aware.   Appended Document: LIDOCAINE 2% pharmacy aware

## 2010-04-14 NOTE — Progress Notes (Signed)
  Phone Note From Pharmacy   Caller: [facility] Summary of Call: wants phenergan instead of zofran states " it works better"  Initial call taken by: Adella Hare LPN,  December 28, 2009 2:50 PM  Follow-up for Phone Call        GI is responsible for her chrionuic nausea, she needs to discuss this with them if med change is requested Follow-up by: Syliva Overman MD,  December 29, 2009 12:27 PM  Additional Follow-up for Phone Call Additional follow up Details #1::        noted Additional Follow-up by: Adella Hare LPN,  December 29, 2009 1:12 PM

## 2010-04-14 NOTE — Letter (Signed)
Summary: [facility] REFERRAL  [facility] REFERRAL   Imported By: Ave Filter 03/16/2009 12:41:59  _____________________________________________________________________  External Attachment:    Type:   Image     Comment:   External Document

## 2010-04-14 NOTE — Progress Notes (Signed)
  Phone Note Other Incoming   Caller: dr Georgine Wiltse Summary of Call: I will try and substitue calcitonn spray for the fosamax, i am sending in the script Initial call taken by: Syliva Overman MD,  May 07, 2009 5:26 PM  Follow-up for Phone Call        Phone call completed Follow-up by: Adella Hare LPN,  May 10, 2009 1:44 PM    New/Updated Medications: CALCITONIN (SALMON) 200 UNIT/ACT SOLN (CALCITONIN (SALMON)) 200 units once daily to be sprayed in the nostril, alternating the nostrils Prescriptions: CALCITONIN (SALMON) 200 UNIT/ACT SOLN (CALCITONIN (SALMON)) 200 units once daily to be sprayed in the nostril, alternating the nostrils  #6000 units x 5   Entered and Authorized by:   Syliva Overman MD   Signed by:   Syliva Overman MD on 05/07/2009   Method used:   Electronically to        [facility] (retail)       [address]       [place], [place]  [address]       Ph: [phone]       Fax: [phone]   RxID:   [id number]

## 2010-04-14 NOTE — Medication Information (Signed)
Summary: Tax adviser   Imported By: Lind Guest 04/22/2009 13:35:24  _____________________________________________________________________  External Attachment:    Type:   Image     Comment:   External Document

## 2010-04-14 NOTE — Miscellaneous (Signed)
Summary: Home Care Report  Home Care Report   Imported By: Lind Guest 07/27/2009 09:42:43  _____________________________________________________________________  External Attachment:    Type:   Image     Comment:   External Document

## 2010-04-14 NOTE — Miscellaneous (Signed)
Summary: Home Care Report  Home Care Report   Imported By: Lind Guest 06/30/2009 14:57:03  _____________________________________________________________________  External Attachment:    Type:   Image     Comment:   External Document

## 2010-04-14 NOTE — Letter (Signed)
Summary: EYE EXAM  EYE EXAM   Imported By: Lind Guest 12/15/2009 10:12:40  _____________________________________________________________________  External Attachment:    Type:   Image     Comment:   External Document

## 2010-04-14 NOTE — Progress Notes (Signed)
Summary: nose bleeds  Phone Note Call from Patient   Summary of Call: pt has had nose bleeds for 3days. wants to know what she can do to stop them. not pouring. [phone] Initial call taken by: Rudene Anda,  June 25, 2009 11:29 AM  Follow-up for Phone Call        apply pressure to the nastrils from outside and hold her head down, if persits and uncontrolled ED Follow-up by: Syliva Overman MD,  June 25, 2009 4:29 PM  Additional Follow-up for Phone Call Additional follow up Details #1::        patient aware Additional Follow-up by: Adella Hare LPN,  June 25, 2009 4:34 PM

## 2010-04-14 NOTE — Letter (Signed)
Summary: DIABETIC TESTING SUPPLIES  DIABETIC TESTING SUPPLIES   Imported By: Lind Guest 11/30/2009 08:33:30  _____________________________________________________________________  External Attachment:    Type:   Image     Comment:   External Document

## 2010-04-14 NOTE — Letter (Signed)
Summary: labs chart 1  labs chart 1   Imported By: Lind Guest 11/12/2009 08:19:40  _____________________________________________________________________  External Attachment:    Type:   Image     Comment:   External Document

## 2010-04-14 NOTE — Progress Notes (Signed)
Summary: leg swelling and tingling  Phone Note Call from Patient   Summary of Call: on her left side where she had a stroke. pt states that leg is swollen and having some tingling. could it be from her fall.  [phone] Initial call taken by: Rudene Anda,  March 24, 2009 1:20 PM  Follow-up for Phone Call        IO do not think so, I advise ED eval Follow-up by: Syliva Overman MD,  March 24, 2009 5:06 PM

## 2010-04-14 NOTE — Assessment & Plan Note (Signed)
Summary: OV   Vital Signs:  Patient profile:   60 year old female Menstrual status:  hysterectomy Height:      62 inches Weight:      175 pounds BMI:     32.12 O2 Sat:      96 % Pulse rate:   94 / minute Resp:     16 per minute BP sitting:   140 / 80  (left arm) Cuff size:   regular  Vitals Entered By: Everitt Amber LPN (December 16, 2009 2:56 PM) CC: Patient c/o dark colored urine and wanted to be checked for UTI .   Referring Provider:  Dr. Lodema Hong Primary Provider:  simpson  CC:  Patient c/o dark colored urine and wanted to be checked for UTI .[name]  History of Present Illness: Pt states she just saw the ENT dr regarding her nasal discomfort and he recommended surgery. She will continue to f/u with him regarding this issue.  She requested her urine to be checked becasue it has been cloudy x couple of days.  No  dysuria.  Her volume first void in the morning seems to be larger than nl.  Her bowels yesterday was dark in color, but not today.  Pt states she has been having her nl 3-4 BMs per day.  Denies constipation or diarrhea.  She has contacted Dr Jeanella Flattery office and has an appt with him on 10-17.    Chronic pain Lt back and flank area.  Worse when lying down.  Pt has see neurosurgeon for herniated discs.  Needs surgery, but feels that she is not a surgical candidate due to her medical hx.  Had 2 sets of epidurals. Was suppose to have 3rd set but was unable because her blood sugar was too high.  Hydrocodone some help for couple of hrs but then pain returns. Pt states she has pain all the time. Per Dr Lodema Hong she has been on other pain meds in the past and has not had adequate pain control with those either.    Allergies: 1)  ! * Keflex 2)  ! Pcn 3)  ! Pyridium 4)  * Milk Products  Past History:  Past medical history reviewed for relevance to current acute and chronic problems.  Past Medical History: Reviewed history from 02/22/2009 and no changes required. Bipolar  d/o CVA H/O pancreatitis due to Depakote with normal EUS in 2006 OSTEOPOROSIS (ICD-733.00) SLEEP APNEA (ICD-780.57) BACK PAIN, CHRONIC (ICD-724.5) NAUSEA, CHRONIC (ICD-787.02) DIABETES MELLITUS (ICD-250.00) TRIGGER FINGER (ICD-727.3) Anxiety Disorder Hypertension Migraines Simple adenomas and hyperplastic polyps on TCS in 2001 Last TCS was in 9/08 by Dr. Lina Sar for diarrhea.  Bx negative for microscopic colitits.  She had diverticulsis.   Glaucoma  Review of Systems General:  Denies chills and fever. ENT:  Complains of nasal congestion. CV:  Denies chest pain or discomfort and palpitations. Resp:  Denies cough and shortness of breath. GI:  Denies abdominal pain, bloody stools, change in bowel habits, nausea, and vomiting; DARK STOOLS YESTERDAY, BUT NOT TARRY. GU:  Denies dysuria and urinary frequency.  Physical Exam  General:  Well-developed,well-nourished,in no acute distress; alert,appropriate and cooperative throughout examination Head:  Normocephalic and atraumatic without obvious abnormalities. No apparent alopecia or balding. Lungs:  Normal respiratory effort, chest expands symmetrically. Lungs are clear to auscultation, no crackles or wheezes. Heart:  Normal rate and regular rhythm. S1 and S2 normal without gallop, murmur, click, rub or other extra sounds. Abdomen:  Bowel sounds positive,abdomen soft and  non-tender without masses, organomegaly or hernias noted. Msk:  Lt lumbar and flank muscular TTP.   Neurologic:  Ambulates with cane, sensation intact to light touch.   Skin:  Intact without suspicious lesions or rashes Psych:  memory intact for recent and remote, normally interactive, not anxious appearing, and not depressed appearing.     Impression & Recommendations:  Problem # 1:  BACK PAIN WITH RADICULOPATHY (ICD-729.2) Assessment Unchanged Advised pt to contact Dr Eduard Clos for f/u.  Problem # 2:  IBS (ICD-564.1) Assessment: Comment Only  Problem # 3:   SINUSITIS, ACUTE (ICD-461.9) Assessment: Comment Only Pt seeing ENT.    Her updated medication list for this problem includes:    Tessalon Perles 100 Mg Caps (Benzonatate) ..... One cap by mouth three times a day    Flonase 50 Mcg/act Susp (Fluticasone propionate) ..... Once daily    Doxycycline Hyclate 100 Mg Caps (Doxycycline hyclate) .[name]... Take 1 two times a day x 10 days  Complete Medication List: 1)  Singulair 10 Mg Tabs (Montelukast sodium) .... One tab by mouth once daily 2)  Zoloft 100 Mg Tabs (Sertraline hcl) .... Two tabs by mouth once daily 3)  Evoxac 30 Mg Caps (Cevimeline hcl) .... One cap by mouth tid 4)  Mirtazapine 30 Mg Tabs (mirtazapine)  .... Take 1 tab by mouth at bedtime 5)  Levoxyl 50 Mcg Tabs (Levothyroxine sodium) .... Take 1 tablet by mouth once a day 6)  Metoprolol Tartrate 25 Mg Tabs (Metoprolol tartrate) .... Take 1 tablet by mouth two times a day 7)  Crestor 10 Mg Tabs (Rosuvastatin calcium) .... Take 1 tab by mouth at bedtime 8)  Lamictal 100 Mg Tabs (Lamotrigine) .... One by mouth daily 9)  Amlodipine Besy-benazepril Hcl 5-40 Mg Caps (Amlodipine besy-benazepril hcl) .... Take 1 tablet by mouth once a day 10)  Trazodone Hcl 100 Mg Tabs (Trazodone hcl) .... Take 1 tab by mouth at bedtime 11)  Ativan 1 Mg Tabs (Lorazepam) .... Take 1 tablet by mouth four times a day 12)  Metoclopramide Hcl 5 Mg Tabs (Metoclopramide hcl) .... One by mouth qach and qhs 13)  Flexeril 10 Mg Tabs (Cyclobenzaprine hcl) .... Take 1 tablet by mouth two times a day 14)  Hydrocodone-acetaminophen 5-500 Mg Tabs (Hydrocodone-acetaminophen) .... Take 1 tablet by mouth two times a day 15)  Plavix 75 Mg Tabs (Clopidogrel bisulfate) .... Once daily 16)  Miacalcin 200 Unit/ml Soln (Calcitonin (salmon)) .... Once daily 17)  Lomotil 2.5-0.025 Mg Tabs (Diphenoxylate-atropine) .... As needed 18)  Glipizide 10 Mg Tabs (Glipizide) .... Take 1 tablet by mouth two times a day 19)  Zofran 4 Mg Tabs  (Ondansetron hcl) .... Take 1 tablet by mouth once a day as needed severe nausea 20)  Onglyza 5 Mg Tabs (Saxagliptin hcl) .... Take 1 tablet by mouth once a day 21)  Tessalon Perles 100 Mg Caps (Benzonatate) .... One cap by mouth three times a day 22)  Flonase 50 Mcg/act Susp (Fluticasone propionate) .... Once daily 23)  Aciphex 20 Mg Tbec (Rabeprazole sodium) .... Once daily 24)  Nystop 100000 Unit/gm Powd (Nystatin) .... Two times a day 25)  Meclizine Hcl 12.5 Mg Tabs (Meclizine hcl) .... As needed 26)  Furosemide 20 Mg Tabs (Furosemide) .... Once daily as needed 27)  Klor-con M20 20 Meq Cr-tabs (Potassium chloride crys cr) .... As needed with furosemide 28)  Vitamin D 50000u  .... Q week 29)  Doxycycline Hyclate 100 Mg Caps (Doxycycline hyclate) .... Take 1  two times a day x 10 days 30)  Clonidine Hcl 0.3 Mg Tabs (Clonidine hcl) .... Take 1 tablet by mouth two times a day 31)  Meclizine Hcl 12.5 Mg Tabs (Meclizine hcl) .... Take one three times a day as needed for dizziness  Patient Instructions: 1)  Keep your next appt with DrSimpson. 2)  Call Dr Ozzie Hoyle office to see if there is anything more she can do to help you. 3)  You have received a shot of Toradol today. 4)  Your urine was normal today. 5)  Keep your appt with Dr Jena Gauss as planned.  Appended Document: OV    Clinical Lists Changes  Problems: Added new problem of URINARY URGENCY (YNW-295.62) - Signed Orders: Added new Service order of Ketorolac-Toradol 15mg  [phone]) - Signed Added new Service order of Admin of Therapeutic Inj  intramuscular or subcutaneous (57846) - Signed Added new Service order of UA Dipstick W/ Micro (manual) (96295) - Signed Observations: Added new observation of APPEARANCE U: Clear (12/16/2009 15:56) Added new observation of SPEC GR URIN: 1.025  (12/16/2009 15:56) Added new observation of PH URINE: 6.0  (12/16/2009 15:56) Added new observation of UA COLOR: yellow  (12/16/2009 15:56) Added new  observation of WBC DIPSTK U: negative  (12/16/2009 15:56) Added new observation of NITRITE URN: negative  (12/16/2009 15:56) Added new observation of UROBILINOGEN: 0.2  (12/16/2009 15:56) Added new observation of PROTEIN, URN: negative  (12/16/2009 15:56) Added new observation of BLOOD UR DIP: trace-intact  (12/16/2009 15:56) Added new observation of KETONES URN: negative  (12/16/2009 15:56) Added new observation of BILIRUBIN UR: negative  (12/16/2009 15:56) Added new observation of GLUCOSE, URN: negative  (12/16/2009 15:56)       Medication Administration  Injection # 1:    Medication: Ketorolac-Toradol 15mg     Diagnosis: BACK PAIN, CHRONIC (ICD-724.5)    Route: IM    Site: RUOQ gluteus    Exp Date: 05/2011    Lot #: [id number]     Mfr: novaplus    Comments: 60mg  given     Patient tolerated injection without complications    Given by: Everitt Amber LPN (December 16, 2009 3:57 PM)  Orders Added: 1)  Ketorolac-Toradol 15mg  [[id number]] 2)  Admin of Therapeutic Inj  intramuscular or subcutaneous [96372] 3)  UA Dipstick W/ Micro (manual) [[id number]]   Laboratory Results   Urine Tests    Routine Urinalysis   Color: yellow Appearance: Clear Glucose: negative   (Normal Range: Negative) Bilirubin: negative   (Normal Range: Negative) Ketone: negative   (Normal Range: Negative) Spec. Gravity: 1.025   (Normal Range: 1.003-1.035) Blood: trace-intact   (Normal Range: Negative) pH: 6.0   (Normal Range: 5.0-8.0) Protein: negative   (Normal Range: Negative) Urobilinogen: 0.2   (Normal Range: 0-1) Nitrite: negative   (Normal Range: Negative) Leukocyte Esterace: negative   (Normal Range: Negative)

## 2010-04-14 NOTE — Progress Notes (Signed)
Summary: nausea  Phone Note Call from Patient   Summary of Call: left message the phar. has faxed over a paper about her needing something for nausea call back at [phone] Initial call taken by: Lind Guest,  September 09, 2009 7:55 AM  Follow-up for Phone Call        requesting zofran but was d/c on 06/08/09 Follow-up by: Adella Hare LPN,  September 09, 2009 10:41 AM  Additional Follow-up for Phone Call Additional follow up Details #1::        pls ask the pt to contact the GI docs who follow her for med for nausea Additional Follow-up by: Syliva Overman MD,  September 09, 2009 11:30 AM    Additional Follow-up for Phone Call Additional follow up Details #2::    patient aware Follow-up by: Adella Hare LPN,  September 09, 2009 1:58 PM

## 2010-04-14 NOTE — Letter (Signed)
Summary: Recall Office Visit  [facility]  [address]   [place], [place] [address]   Phone: [phone]  Fax: [phone]      March 29, 2010   [name] [address] [place] [address] [place], [place]  [address] 1951-03-12   Dear [name],   According to our records, it is time for you to schedule a follow-up office visit with [name].   At your convenience, please call [phone] to schedule an office visit. If you have any questions, concerns, or feel that this letter is in error, we would appreciate your call.   Sincerely,    Diana Eves  [facility] Ph: [phone]   Fax: [phone]

## 2010-04-14 NOTE — Letter (Signed)
Summary: demographic chart 2  demographic chart 2   Imported By: Curtis Sites 11/12/2009 15:45:04  _____________________________________________________________________  External Attachment:    Type:   Image     Comment:   External Document

## 2010-04-14 NOTE — Progress Notes (Signed)
Summary: paper  Phone Note Call from Patient   Summary of Call: needs a paper stating what diet dr. Luther Parody got her on. [phone] Initial call taken by: Rudene Anda,  September 01, 2009 4:42 PM  Follow-up for Phone Call        pls write a statement saying that she is on a#no concentrated sweets", and stamp Follow-up by: Syliva Overman MD,  September 01, 2009 4:54 PM  Additional Follow-up for Phone Call Additional follow up Details #1::        wrote statement and mailed Additional Follow-up by: Adella Hare LPN,  September 02, 2009 8:44 AM

## 2010-04-14 NOTE — Miscellaneous (Signed)
Summary: Home Care Report  Home Care Report   Imported By: Lind Guest 06/08/2009 09:59:05  _____________________________________________________________________  External Attachment:    Type:   Image     Comment:   External Document

## 2010-04-14 NOTE — Progress Notes (Signed)
Summary: GOING TO ED  Phone Note Call from Patient   Summary of Call: GOING TO ED Initial call taken by: Lind Guest,  Jul 12, 2009 4:07 PM  Follow-up for Phone Call        noted Follow-up by: Everitt Amber LPN,  Jul 12, 1608 4:30 PM

## 2010-04-14 NOTE — Letter (Signed)
Summary: EGD INSTRUCTIONS  EGD INSTRUCTIONS   Imported By: Ave Filter 04/23/2009 11:39:07  _____________________________________________________________________  External Attachment:    Type:   Image     Comment:   External Document

## 2010-04-14 NOTE — Progress Notes (Signed)
  Phone Note From Pharmacy   Caller: pharmacy Summary of Call: patient states she needs somthing for pain, flexeril discontinued in march, do you want to restart?  also should patient still be using clonidine patches? Initial call taken by: Adella Hare LPN,  June 29, 2009 10:09 AM  Follow-up for Phone Call        last med l;ist is correct, so if no patches onlistshe does nopt need them. Any pain med other than what she iisalready taking 9prob tylenol0 will neded to comefrom Dr Eduard Clos, that is why she was referred to aspecialist, too many issues with uncontrolled [pain and excessive sedation Follow-up by: Syliva Overman MD,  June 29, 2009 12:39 PM  Additional Follow-up for Phone Call Additional follow up Details #1::        patient aware Additional Follow-up by: Adella Hare LPN,  June 29, 2009 1:37 PM

## 2010-04-14 NOTE — Progress Notes (Signed)
  Phone Note Call from Patient   Summary of Call: pt has a sore throat and a dry cough and would like to know what she could take for this. [phone] rx care Initial call taken by: Rudene Anda,  March 17, 2010 3:32 PM  Follow-up for Phone Call        cough, colored drainage patient sounds really stuffy and breathing hard advised to go to urgent care for eval Follow-up by: Adella Hare LPN,  March 17, 2010 3:42 PM

## 2010-04-14 NOTE — Progress Notes (Signed)
Summary: sugar high  Phone Note Call from Patient   Summary of Call: wants you to call her her was high last  night and this morning 220 call her back Initial call taken by: Lind Guest,  Jul 21, 2009 8:45 AM  Follow-up for Phone Call        patient just had glipizide increased last friday Follow-up by: Adella Hare LPN,  Jul 21, 2009 10:21 AM  Additional Follow-up for Phone Call Additional follow up Details #1::        Continue current dose of Glipizide. I have added Onglyza once daily. Additional Follow-up by: Esperanza Sheets PA,  Jul 21, 2009 11:05 AM    Additional Follow-up for Phone Call Additional follow up Details #2::    called patient, no answer Follow-up by: Adella Hare LPN,  Jul 21, 2009 2:18 PM  Additional Follow-up for Phone Call Additional follow up Details #3:: Details for Additional Follow-up Action Taken: patient aware Additional Follow-up by: Adella Hare LPN,  Jul 21, 2009 3:34 PM  New/Updated Medications: ONGLYZA 2.5 MG TABS (SAXAGLIPTIN HCL) take 1 daily Prescriptions: ONGLYZA 2.5 MG TABS (SAXAGLIPTIN HCL) take 1 daily  #30 x 1   Entered and Authorized by:   Esperanza Sheets PA   Signed by:   Esperanza Sheets PA on 07/21/2009   Method used:   Electronically to        [facility] (retail)       [address]       [place], [place]  [address]       Ph: [phone]       Fax: [phone]   RxID:   [id number]

## 2010-04-14 NOTE — Assessment & Plan Note (Signed)
Summary: FU OV WITH RMR IN 8-10WKS/GERD/SS   Visit Type:  Follow-up Visit Primary Care Provider:  simpson  Chief Complaint:  follow up- had diarrhea and now has alot of gas.  History of Present Illness: Complicated 60 year old lady now the Hemoccult-positive stool. No gross GI bleeding.March 2007-  . Last EGD earlier this year. Stomach was full of food. This precluded esophageal dilation. Gastric emptying study should very poor emptying; she is now on metoclopramide with some improvement in nausea symptoms. Apparently she is come off acid suppression therapy entirely along the way. Last tcs 2008 down in [place] - Dr. Juanda Chance- r/o microscopic colitis/ tics on tcs before that -Dr. Karilyn Cota.  Prior EGD 2009 demonstrated a patent esophagus passed a 54 [place] dilator. This was associated with marked improvement in dysphagia. She does have dysmotility on a barium esophagram. A manometry was attempted at [facility] but they could not pass the catheter earlier this year. Distant history of pancreatitis felt secondary to Depakote.  We refilled AcipHex some 2 months agobut patient denies taking that medication at this time.  Patient called in 2 days ago states she had 2 days worth of nonbloody watery diarrhea. We were planning to do some stool studies. Howeve,r she feels the diarrhea has resolved.  Current Problems (verified): 1)  Diarrhea  (ICD-787.91) 2)  Sinusitis, Acute  (ICD-461.9) 3)  Gastroparesis  (ICD-536.3) 4)  Ankle Pain  (ICD-719.47) 5)  Dysphagia Unspecified  (ICD-787.20) 6)  Liver Function Tests, Abnormal, Hx of  (ICD-V12.2) 7)  Pancreatitis, Hx of  (ICD-V12.70) 8)  Ibs  (ICD-564.1) 9)  Hyperlipidemia  (ICD-272.4) 10)  Acromioclavicular Sprain and Strain  (ICD-840.0) 11)  Leg Pain, Right  (ICD-729.5) 12)  Gerd  (ICD-530.81) 13)  Abdominal Pain, Generalized  (ICD-789.07) 14)  Abdominal Bloating  (ICD-787.3) 15)  Anemia  (ICD-285.9) 16)  Milk Products Allergy  (ICD-V15.02) 17)   Leg Pain, Right  (ICD-729.5) 18)  Screening For Lipoid Disorders  (ICD-V77.91) 19)  Fatigue  (ICD-780.79) 20)  Back Pain With Radiculopathy  (ICD-729.2) 21)  Allergy Unspecified Not Elsewhere Classified  (ICD-995.3) 22)  Nausea  (ICD-787.02) 23)  Headache  (ICD-784.0) 24)  Abnormality of Gait  (ICD-781.2) 25)  Obesity  (ICD-278.00) 26)  Psychotic D/o W/hallucinations Conds Class [place]  (ICD-293.82) 27)  Insomnia  (ICD-780.52) 28)  Anemia, Iron Deficiency, Chronic  (ICD-280.9) 29)  Hypertension  (ICD-401.9) 30)  Diverticulosis, Colon  (ICD-562.10) 31)  Osteoporosis  (ICD-733.00) 32)  Sleep Apnea  (ICD-780.57) 33)  Back Pain, Chronic  (ICD-724.5) 34)  Nausea, Chronic  (ICD-787.02) 35)  Diabetes Mellitus  (ICD-250.00) 36)  Trigger Finger  (ICD-727.3)  Current Medications (verified): 1)  Singulair 10 Mg  Tabs (Montelukast Sodium) .... One Tab By Mouth Once Daily 2)  Zoloft 100 Mg  Tabs (Sertraline Hcl) .... Two Tabs By Mouth Once Daily 3)  Evoxac 30 Mg Caps (Cevimeline Hcl) .... One Cap By Mouth Tid 4)  Mirtazapine 30 Mg Tabs (Mirtazapine) .... Take 1 Tab By Mouth At Bedtime 5)  Levoxyl 50 Mcg Tabs (Levothyroxine Sodium) .... Take 1 Tablet By Mouth Once A Day 6)  Gabapentin 300 Mg Caps (Gabapentin) .... Take 1 Capsule By Mouth Three Times A Day 7)  Metoprolol Tartrate 25 Mg Tabs (Metoprolol Tartrate) .... Take 1 Tablet By Mouth Two Times A Day 8)  Crestor 10 Mg Tabs (Rosuvastatin Calcium) .... Take 1 Tab By Mouth At Bedtime 9)  Lamictal 100 Mg Tabs (Lamotrigine) .... One By Mouth Daily 10)  Amlodipine  Besy-Benazepril Hcl 5-40 Mg Caps (Amlodipine Besy-Benazepril Hcl) .... Take 1 Tablet By Mouth Once A Day 11)  Trazodone Hcl 100 Mg Tabs (Trazodone Hcl) .... Take 1 Tab By Mouth At Bedtime 12)  Ativan 1 Mg Tabs (Lorazepam) .... Take 1 Tablet By Mouth Four Times A Day 13)  Metoclopramide Hcl 5 Mg Tabs (Metoclopramide Hcl) .... One By Mouth Qach and Qhs 14)  Flexeril 10 Mg Tabs  (Cyclobenzaprine Hcl) .... Take 1 Tablet By Mouth Two Times A Day 15)  Hydrocodone-Acetaminophen 5-500 Mg Tabs (Hydrocodone-Acetaminophen) .... Take 1 Tablet By Mouth Two Times A Day 16)  Plavix 75 Mg Tabs (Clopidogrel Bisulfate) .... Once Daily 17)  Miacalcin 200 Unit/ml Soln (Calcitonin (Salmon)) .... Once Daily 18)  Lomotil 2.5-0.025 Mg Tabs (Diphenoxylate-Atropine) .... As Needed 19)  Glipizide 10 Mg Tabs (Glipizide) .... Take 1 Tablet By Mouth Two Times A Day 20)  Zofran 4 Mg Tabs (Ondansetron Hcl) .... Take 1 Tablet By Mouth Once A Day As Needed Severe Nausea 21)  Clonidine Hcl 0.2 Mg Tabs (Clonidine Hcl) .... One Tablet in The Morning and One and A Half in The Evening 22)  Onglyza 5 Mg Tabs (Saxagliptin Hcl) .... Take 1 Tablet By Mouth Once A Day 23)  Tessalon Perles 100 Mg Caps (Benzonatate) .... One Cap By Mouth Three Times A Day 24)  Flonase 50 Mcg/act Susp (Fluticasone Propionate) .... Once Daily 25)  Aciphex 20 Mg Tbec (Rabeprazole Sodium) .... Once Daily 26)  Nystop 100000 Unit/gm Powd (Nystatin) .... Two Times A Day 27)  Meclizine Hcl 12.5 Mg Tabs (Meclizine Hcl) .... As Needed 28)  Furosemide 20 Mg Tabs (Furosemide) .... Once Daily As Needed 29)  Klor-Con M20 20 Meq Cr-Tabs (Potassium Chloride Crys Cr) .... As Needed With Furosemide 30)  Vitamin D 50000u .... Q Week  Allergies (verified): 1)  ! * Keflex 2)  ! Pcn 3)  ! Pyridium 4)  * Milk Products  Past History:  Past Medical History: Last updated: 02/22/2009 Bipolar d/o CVA H/O pancreatitis due to Depakote with normal EUS in 2006 OSTEOPOROSIS (ICD-733.00) SLEEP APNEA (ICD-780.57) BACK PAIN, CHRONIC (ICD-724.5) NAUSEA, CHRONIC (ICD-787.02) DIABETES MELLITUS (ICD-250.00) TRIGGER FINGER (ICD-727.3) Anxiety Disorder Hypertension Migraines Simple adenomas and hyperplastic polyps on TCS in 2001 Last TCS was in 9/08 by Dr. Lina Sar for diarrhea.  Bx negative for microscopic colitits.  She had diverticulsis.     Glaucoma  Past Surgical History: Last updated: 05/12/2008 hysterectomy cholecystectomy ovarian cystectomy carpal tunnell release left by Dr. Romeo Apple 09/02/04 open right carpal tunnel release by Dr. Romeo Apple 07/22/04 breast reduction surgery B cataract surgery in 2009 Biopsy of thyroid gland 2009 surgical excision of 3 tumors from r thigh and R buttock and left upper thigh  019/2010  Family History: Last updated: 07-31-08 ONE CHILD MOTHER DECEASED MI  FATHER DECEASED  ESRD , PNEUMONIA ONE SISTER DECEASED PANCREATIC CANCER ONE BROTHER LIVING HTN / DM Aunt - colon cancer  Social History: Last updated: 06/19/2007 DISABLED Divorced Former Smoker Alcohol use-no Drug use-no  Risk Factors: Smoking Status: never (05/21/2008)  Vital Signs:  Patient profile:   60 year old female Menstrual status:  hysterectomy Height:      62 inches Weight:      173 pounds BMI:     31.76 Temp:     98.3 degrees F oral Pulse rate:   80 / minute BP sitting:   118 / 72  (left arm) Cuff size:   regular  Vitals Entered By: Hendricks Limes  LPN (September 23, 2009 10:01 AM)  Physical Exam  General:  chronically ill-appearing 60 year old lady resting comfortably Eyes:  no scleral icterus. Abdomen:  obese no succussion splash. Positive bowel sounds soft nontender without obvious mass or organomegaly  Impression & Recommendations: Impression: Chronic dysphagia likely secondary to underlying esophageal motility disorder. GERD. Gastroparesis. Hemoccult-Positive.  Recommendations: Will set her up for both EGD and colonoscopy in the near future. hopefully,  she will have an empty stomach and we will be able to dilate her esophagus as this appeared to help previously. She is not having a side effect with metoclopramide at this time. She needs to be on acid suppression therapy  I discussed the risks, benefits, limitations, imponderables with this nice lady today. Her questions have been answered she is  agreeable. Among further recommendations once endoscopic evaluation was carried out.    Course of Dexalant 60 mg orally daily. Samples x3 weeks.  Appended Document: Orders Update    Clinical Lists Changes  Problems: Added new problem of HEMOCCULT POSITIVE STOOL (ICD-578.1) Orders: Added new Service order of Est. Patient Level IV ([address]) - Signed

## 2010-04-14 NOTE — Progress Notes (Signed)
Summary: speak with nurse  Phone Note Call from Patient   Summary of Call: pt would like to speak with jamie.  cell [phone] Initial call taken by: Rudene Anda,  November 16, 2009 1:49 PM  Follow-up for Phone Call        patient to come in for toradol 60mg  and flu shot- per dr simpson Follow-up by: Adella Hare LPN,  November 16, 2009 2:27 PM

## 2010-04-14 NOTE — Progress Notes (Signed)
  Phone Note From Pharmacy   Caller: [facility] Summary of Call: glipizide xl 2.5mg  requires pa Initial call taken by: Adella Hare LPN,  May 21, 2009 3:58 PM  Follow-up for Phone Call        pls verify that the script is for 2.5mg  two twice daily, if so, change to 5mg  of glipizide xr 24hr 5mg  one twice daily #60 refill 3 and erx in and notify t and pharmacy pls Follow-up by: Syliva Overman MD,  May 23, 2009 10:12 PM  Additional Follow-up for Phone Call Additional follow up Details #1::        patient takes two in am and one in pm Additional Follow-up by: Adella Hare LPN,  May 24, 2009 9:14 AM    Additional Follow-up for Phone Call Additional follow up Details #2::    advise pt and pharmacy, new 5mg  tab, one in am and half in pm of glipizide 5mg  pls Follow-up by: Syliva Overman MD,  May 24, 2009 1:11 PM  Additional Follow-up for Phone Call Additional follow up Details #3:: Details for Additional Follow-up Action Taken: patient and pharmacy aware Additional Follow-up by: Adella Hare LPN,  May 24, 2009 2:37 PM  New/Updated Medications: GLIPIZIDE 5 MG TABS (GLIPIZIDE) one tablet in the morning at breakfast, and half in the evening at supper Prescriptions: GLIPIZIDE 5 MG TABS (GLIPIZIDE) one tablet in the morning at breakfast, and half in the evening at supper  #46 x 3   Entered and Authorized by:   Syliva Overman MD   Signed by:   Syliva Overman MD on 05/24/2009   Method used:   Electronically to        [facility] (retail)       [address]       [place], [place]  [address]       Ph: [phone]       Fax: [phone]   RxID:   [id number]

## 2010-04-14 NOTE — Progress Notes (Signed)
  Phone Note From Pharmacy   Caller: [facility] Summary of Call: requesting refill on plavix, should patient still be on this? was removed from med list Initial call taken by: Adella Hare LPN,  June 16, 2009 3:44 PM  Follow-up for Phone Call        pls let her know this is one of the meds which has been discontinued, do not refill Follow-up by: Syliva Overman MD,  June 17, 2009 5:12 AM  Additional Follow-up for Phone Call Additional follow up Details #1::        noted Additional Follow-up by: Adella Hare LPN,  June 17, 2009 1:44 PM

## 2010-04-14 NOTE — Progress Notes (Signed)
Summary: bloody clotts coming out nose  Phone Note Call from Patient   Summary of Call: still having bloody clotts coming out nose. and has took all antibotics  pt states she forgot to say something to doc today  [phone] Initial call taken by: Rudene Anda,  May 18, 2009 3:50 PM  Follow-up for Phone Call        I see a phone note in Jan where she called in with sinus congestion& we prescribed an antibiotics.  Is she still having sinus congestion & drainage?  Or is it more like a bloody nose? Follow-up by: Esperanza Sheets PA,  May 18, 2009 3:59 PM  Additional Follow-up for Phone Call Additional follow up Details #1::        is having sinus drainage with some blood Additional Follow-up by: Adella Hare LPN,  May 18, 2009 4:07 PM    Additional Follow-up for Phone Call Additional follow up Details #2::    Rx Doxycyline 100mg  #20 i two times a day pc x 10 days.  No rf. Follow-up by: Esperanza Sheets PA,  May 18, 2009 4:11 PM  Additional Follow-up for Phone Call Additional follow up Details #3:: Details for Additional Follow-up Action Taken: rx sent, patient aware Additional Follow-up by: Adella Hare LPN,  May 18, 2009 4:15 PM  New/Updated Medications: DOXYCYCLINE HYCLATE 100 MG CAPS (DOXYCYCLINE HYCLATE) one cap by mouth two times a day Prescriptions: DOXYCYCLINE HYCLATE 100 MG CAPS (DOXYCYCLINE HYCLATE) one cap by mouth two times a day  #20 x 0   Entered by:   Adella Hare LPN   Authorized by:   Esperanza Sheets PA   Signed by:   Adella Hare LPN on 43/32/9518   Method used:   Electronically to        [facility] (retail)       [address]       [place], [place]  [address]       Ph: [phone]       Fax: [phone]   RxID:   [id number]

## 2010-04-14 NOTE — Letter (Signed)
Summary: phone notes chart 1  phone notes chart 1   Imported By: Lind Guest 11/12/2009 08:21:22  _____________________________________________________________________  External Attachment:    Type:   Image     Comment:   External Document

## 2010-04-14 NOTE — Progress Notes (Signed)
  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

## 2010-04-14 NOTE — Letter (Signed)
Summary: [facility] MED LIST  [facility] MED LIST   Imported By: Diana Eves 04/16/2009 11:27:31  _____________________________________________________________________  External Attachment:    Type:   Image     Comment:   External Document

## 2010-04-14 NOTE — Miscellaneous (Signed)
Summary: refill  Clinical Lists Changes  Medications: Rx of PLAVIX 75 MG TABS (CLOPIDOGREL BISULFATE) once daily;  #30 x 3;  Signed;  Entered by: Worthy Keeler LPN;  Authorized by: Syliva Overman MD;  Method used: Electronically to [facility] [address], [place], [place], [place]  [address], Ph: [phone], Fax: [phone] Rx of GLIPIZIDE 2.5 MG XR24H-TAB (GLIPIZIDE) 2 tablets in the morning at breakfast and 1 tablet at suppertime;  #90 x 3;  Signed;  Entered by: Worthy Keeler LPN;  Authorized by: Syliva Overman MD;  Method used: Electronically to [facility] [address], [place], [place], [place]  [address], Ph: [phone], Fax: [phone]    Prescriptions: GLIPIZIDE 2.5 MG XR24H-TAB (GLIPIZIDE) 2 tablets in the morning at breakfast and 1 tablet at suppertime  #90 x 3   Entered by:   Worthy Keeler LPN   Authorized by:   Syliva Overman MD   Signed by:   Worthy Keeler LPN on 42/59/5638   Method used:   Electronically to        [facility] (retail)       [address]       [place], [place]  [address]       Ph: [phone]       Fax: [phone]   RxID:   [id number] PLAVIX 75 MG TABS (CLOPIDOGREL BISULFATE) once daily  #30 x 3   Entered by:   Worthy Keeler LPN   Authorized by:   Syliva Overman MD   Signed by:   Worthy Keeler LPN on 09/03/7626   Method used:   Electronically to        [facility] (retail)       [address]       [place], [place]  [address]       Ph: [phone]       Fax: [phone]   RxID:   [id number]

## 2010-04-14 NOTE — Progress Notes (Signed)
Summary: speak with [name]  Phone Note Call from Patient   Summary of Call: needs to speak with [name]. Due to some office not understandine her. [phone] Initial call taken by: Rudene Anda,  August 20, 2009 11:43 AM  Follow-up for Phone Call        wanted [facility] number Follow-up by: Everitt Amber LPN,  August 20, 2009 4:38 PM

## 2010-04-14 NOTE — Progress Notes (Signed)
Summary: had surgery voice sounds awful  Phone Note Call from Patient   Summary of Call: had surgery wants dr to please call she needs a rx for dukes lixclar or something you can understand her voice sounds awful she is not eating  please send to [facility]  call let her know Initial call taken by: Lind Guest,  February 10, 2010 11:34 AM  Follow-up for Phone Call        pls let her know med is sent in, an I hope she recovers well from the surgery Follow-up by: Syliva Overman MD,  February 10, 2010 5:03 PM  Additional Follow-up for Phone Call Additional follow up Details #1::        called patient, no answer Additional Follow-up by: Adella Hare LPN,  February 10, 2010 5:17 PM    Additional Follow-up for Phone Call Additional follow up Details #2::    patient aware Follow-up by: Adella Hare LPN,  February 11, 2010 3:56 PM  New/Updated Medications: FIRST-DUKES MOUTHWASH  SUSP (DIPHENHYD-HYDROCORT-NYSTATIN) 2 teaspoons three times daily as needed for horseness Prescriptions: FIRST-DUKES MOUTHWASH  SUSP (DIPHENHYD-HYDROCORT-NYSTATIN) 2 teaspoons three times daily as needed for horseness  #352ml x 0   Entered and Authorized by:   Syliva Overman MD   Signed by:   Syliva Overman MD on 02/10/2010   Method used:   Electronically to        [facility] (retail)       [address]       [place], [place]  [address]       Ph: [phone]       Fax: [phone]   RxID:   [id number]

## 2010-04-14 NOTE — Progress Notes (Signed)
Summary: BP  Phone Note Call from Patient   Summary of Call: WHEN GOING TO DR TODAY AND HER BP WAS 188/99 Initial call taken by: Lind Guest,  December 02, 2009 4:48 PM  Follow-up for Phone Call        blood pressure yesterday @ [facility] office was 188/99 states she isnt sleeping at night and feels like bp is up from stress and no rest  patient would like to know if dr simpson will give her somthing for sleep as needed  [facility] discontinued neurontin Follow-up by: Adella Hare LPN,  December 03, 2009 9:47 AM  Additional Follow-up for Phone Call Additional follow up Details #1::        I have already stated that dr Thomasena Edis is the one to address her sleep meds. She also needs oV next week re her blood pressure, pls have her make an appt for this. She can try oNE tylenol pM tablet at night , this may help withsleep, but as far as prescription med are concerned , she needs to speak with Dr.Collins  Additional Follow-up by: Syliva Overman MD,  December 03, 2009 12:11 PM    Additional Follow-up for Phone Call Additional follow up Details #2::    patient aware Follow-up by: Adella Hare LPN,  December 03, 2009 2:09 PM

## 2010-04-14 NOTE — Letter (Signed)
Summary: [facility]  [facility]   Imported By: Lind Guest 01/25/2010 08:55:08  _____________________________________________________________________  External Attachment:    Type:   Image     Comment:   External Document

## 2010-04-14 NOTE — Medication Information (Signed)
Summary: RX Folder  RX Folder   Imported By: Peggyann Shoals 10/19/2009 14:02:38  _____________________________________________________________________  External Attachment:    Type:   Image     Comment:   External Document  Appended Document: RX Folder:[facility]    Prescriptions: METOPROLOL TARTRATE 25 MG TABS (METOPROLOL TARTRATE) Take 1 tablet by mouth two times a day  #60 x 2   Entered and Authorized by:   Joselyn Arrow FNP-BC   Signed by:   Joselyn Arrow FNP-BC on 10/19/2009   Method used:   Electronically to        [facility] (retail)       [address]       [place], [place]  [address]       Ph: [phone]       Fax: [phone]   RxID:   [id number]

## 2010-04-14 NOTE — Assessment & Plan Note (Signed)
Summary: bp   Vital Signs:  Patient profile:   60 year old female Menstrual status:  hysterectomy Height:      62 inches Weight:      171.25 pounds BMI:     31.44 O2 Sat:      97 % on Room air Pulse rate:   109 / minute Pulse rhythm:   regular BP sitting:   180 / 90  (left arm)  Vitals Entered By: Adella Hare LPN (March 10, 2010 3:02 PM)  Nutrition Counseling: Patient's BMI is greater than 25 and therefore counseled on weight management options.  O2 Flow:  Room air CC: bp up Is Patient Diabetic? Yes Comments did not bring meds   Primary Care Provider:  Lodema Hong  CC:  bp up.  History of Present Illness: Pt sent upfrom the ENT clinic this afternoon becuse her blood pressure was found to be elevated at that visit. She has had no recent change in med doses. she denies chest pain,palpitation, pND , orhtopnea or leg swelling.  she denies exertional fatigue.  Allergies: 1)  ! * Keflex 2)  ! Pcn 3)  ! Pyridium 4)  * Milk Products  Review of Systems      See HPI General:  Complains of sleep disorder. Eyes:  Denies discharge and eye pain. Resp:  Denies cough and sputum productive. GI:  Denies constipation and diarrhea. GU:  Denies dysuria and urinary frequency. MS:  Complains of low back pain and mid back pain; states that surgery is being offered, she is considering this.  Physical Exam  General:  Well-developed,overweight,in no acute distress; alert,appropriate and cooperative throughout examination HEENT: No facial asymmetry,  EOMI, No sinus tenderness, TM's Clear, oropharynx  pink and moist.   Chest: Clear to auscultation bilaterally.  CVS: S1, S2, No murmurs, No S3.   Abd: Soft, Nontender. Obese. AV:WUJWJXBJY ROM spine, hips, shoulders and knees.  Ext: No edema.   CNS: CN 2-12 intact, power tone and sensation normal throughout.   Skin: Intact, no visible lesions or rashes.  Psych: Good eye contact, normal affect.  Memory 0oss, not anxious or depressed  appearing.    Impression & Recommendations:  Problem # 1:  HYPERTENSION (ICD-401.9) Assessment Deteriorated  The following medications were removed from the medication list:    Clonidine Hcl 0.3 Mg Tabs (Clonidine hcl) .[name]... Take 1 tablet by mouth two times a day Her updated medication list for this problem includes:    Metoprolol Tartrate 25 Mg Tabs (Metoprolol tartrate) .[name]... Take 1 tablet by mouth two times a day    Amlodipine Besy-benazepril Hcl 5-40 Mg Caps (Amlodipine besy-benazepril hcl) .[name]... Take 1 tablet by mouth once a day    Furosemide 20 Mg Tabs (Furosemide) ..... Once daily as needed    Clonidine Hcl 0.3 Mg Tabs (Clonidine hcl) ..... One tablet at 8am, one at 4pm and one at   Orders: Medicare Electronic Prescription [phone])  BP today: 180/90 Prior BP: 122/80 (12/27/2009)  Labs Reviewed: K+: 4.7 (12/21/2009) Creat: : 0.90 (12/21/2009)   Chol: 158 (09/14/2009)   HDL: 43 (09/14/2009)   LDL: 73 (09/14/2009)   TG: 211 (09/14/2009)  Problem # 2:  OBESITY (ICD-278.00) Assessment: Comment Only  Ht: 62 (03/10/2010)   Wt: 171.25 (03/10/2010)   BMI: 31.44 (03/10/2010) therapeutic lifestyle change discussed and encouraged  Complete Medication List: 1)  Singulair 10 Mg Tabs (Montelukast sodium) .... One tab by mouth once daily 2)  Zoloft 100 Mg Tabs (Sertraline hcl) .... Two  tabs by mouth once daily 3)  Evoxac 30 Mg Caps (Cevimeline hcl) .... One cap by mouth tid 4)  Mirtazapine 30 Mg Tabs (mirtazapine)  .... Take 1 tab by mouth at bedtime 5)  Levoxyl 50 Mcg Tabs (Levothyroxine sodium) .... Take 1 tablet by mouth once a day 6)  Metoprolol Tartrate 25 Mg Tabs (Metoprolol tartrate) .... Take 1 tablet by mouth two times a day 7)  Crestor 10 Mg Tabs (Rosuvastatin calcium) .... Take 1 tab by mouth at bedtime 8)  Lamictal 100 Mg Tabs (Lamotrigine) .... One by mouth daily 9)  Amlodipine Besy-benazepril Hcl 5-40 Mg Caps (Amlodipine besy-benazepril hcl) .... Take 1 tablet  by mouth once a day 10)  Trazodone Hcl 100 Mg Tabs (Trazodone hcl) .... Take 1 tab by mouth at bedtime 11)  Ativan 1 Mg Tabs (Lorazepam) .... Take 1 tablet by mouth four times a day 12)  Metoclopramide Hcl 5 Mg Tabs (Metoclopramide hcl) .... One by mouth qach and qhs 13)  Flexeril 10 Mg Tabs (Cyclobenzaprine hcl) .... Take 1 tablet by mouth two times a day 14)  Plavix 75 Mg Tabs (Clopidogrel bisulfate) .... Once daily 15)  Miacalcin 200 Unit/ml Soln (Calcitonin (salmon)) .... Once daily 16)  Lomotil 2.5-0.025 Mg Tabs (Diphenoxylate-atropine) .[name].. 1 by mouth two times a day as needed for diarrhea 17)  Glipizide 10 Mg Tabs (Glipizide) .... Take 1 tablet by mouth two times a day 18)  Zofran 4 Mg Tabs (Ondansetron hcl) .... Take 1 tablet by mouth once a day as needed severe nausea 19)  Onglyza 5 Mg Tabs (Saxagliptin hcl) .... Take 1 tablet by mouth once a day 20)  Aciphex 20 Mg Tbec (Rabeprazole sodium) .... Once daily 21)  Nystop 100000 Unit/gm Powd (Nystatin) .... Two times a day 22)  Furosemide 20 Mg Tabs (Furosemide) .... Once daily as needed 23)  Klor-con M20 20 Meq Cr-tabs (Potassium chloride crys cr) .... As needed with furosemide 24)  Vitamin D 50000u  .... Q week 25)  Antivert 12.5 Mg Tabs (Meclizine hcl) .... One tab by mouth three times a day as needed for dizziness 26)  Voltaren 1 % Gel (Diclofenac sodium) .... Apply twice daily to affected area as neede 27)  Hydrocodone-acetaminophen 5-500 Mg Tabs (Hydrocodone-acetaminophen) .... One tab by mouth two times a day 28)  First-dukes Mouthwash Susp (Diphenhyd-hydrocort-nystatin) .... 2 teaspoons three times daily as needed for horseness 29)  Clonidine Hcl 0.3 Mg Tabs (Clonidine hcl) .... One tablet at 8am, one at 4pm and one at   Patient Instructions: 1)  F/U  as before 2)  Dose increasease on clonidine to 3 times daily, every 8 hrs Prescriptions: CLONIDINE HCL 0.3 MG TABS (CLONIDINE HCL) one tablet at 8am, one at 4pm and one  at   #90 x 3   Entered and Authorized by:   Syliva Overman MD   Signed by:   Syliva Overman MD on 03/10/2010   Method used:   Printed then faxed to ...       [facility] (retail)       [address]       [place], [place]  [address]       Ph: [phone]       Fax: [phone]   RxID:   [id number]    Orders Added: 1)  Est. Patient Level III [84132] 2)  Medicare Electronic Prescription [G8553]  Appended Document: bp  Laboratory  Results   Urine Tests  Date/Time Received:  Date/Time Reported:   Routine Urinalysis   Color: yellow Appearance: Clear Glucose: negative   (Normal Range: Negative) Bilirubin: negative   (Normal Range: Negative) Ketone: negative   (Normal Range: Negative) Spec. Gravity: >=1.030   (Normal Range: 1.003-1.035) Blood: negative   (Normal Range: Negative) pH: 5.5   (Normal Range: 5.0-8.0) Protein: negative   (Normal Range: Negative) Urobilinogen: 0.2   (Normal Range: 0-1) Nitrite: negative   (Normal Range: Negative) Leukocyte Esterace: negative   (Normal Range: Negative)

## 2010-04-19 ENCOUNTER — Telehealth: Payer: Self-pay | Admitting: Family Medicine

## 2010-04-19 ENCOUNTER — Ambulatory Visit (INDEPENDENT_AMBULATORY_CARE_PROVIDER_SITE_OTHER): Payer: Medicare Other | Admitting: Gastroenterology

## 2010-04-19 ENCOUNTER — Encounter: Payer: Self-pay | Admitting: Gastroenterology

## 2010-04-19 DIAGNOSIS — R1319 Other dysphagia: Secondary | ICD-10-CM

## 2010-04-19 DIAGNOSIS — K3184 Gastroparesis: Secondary | ICD-10-CM

## 2010-04-19 DIAGNOSIS — R1084 Generalized abdominal pain: Secondary | ICD-10-CM

## 2010-04-20 NOTE — Assessment & Plan Note (Signed)
Summary: office visit   Vital Signs:  Patient profile:   60 year old female Menstrual status:  hysterectomy Height:      62 inches Weight:      170.50 pounds O2 Sat:      96 % Pulse rate:   87 / minute Pulse rhythm:   regular Resp:     16 per minute BP sitting:   140 / 80  (right arm)  Vitals Entered By: Everitt Amber LPN (March 29, 2010 9:57 AM) CC: thursday she started hurting really bad in her right side, up to her ribs and all the way down her side. Sharp pain when she breaths. Was advised urgent care last week but did not go. Also her right knee started hurting sunday also Pain Assessment Patient in pain? yes     Location: right side  Intensity: 9 Type: sharp/achy Onset of pain  began thursday   Primary Care Provider:  Lodema Hong  CC:  thursday she started hurting really bad in her right side and up to her ribs and all the way down her side. Sharp pain when she breaths. Was advised urgent care last week but did not go. Also her right knee started hurting sunday also.  History of Present Illness: 6 day h/o RUQ pain rrated at 9, chills, no apetite , chronic nausea, no vomitting, starts in the flank and goes under breast.Was dx with possible pneumonia at urgent care recently, and just completed a course of antibiotics.  Increased shortness of breath with minimal activity  Off balance ,uses a cane/walker at home, difficulty getting outof bed due to back pain and stiffness, the right knee has been swollen and painful  espescially in calf on the right  Current Medications (verified): 1)  Singulair 10 Mg  Tabs (Montelukast Sodium) .... One Tab By Mouth Once Daily 2)  Zoloft 50 Mg Tabs (Sertraline Hcl) .... 3 Tabs Once Daily 3)  Evoxac 30 Mg Caps (Cevimeline Hcl) .... One Cap By Mouth Tid 4)  Mirtazapine 30 Mg Tabs (Mirtazapine) .... Take 1 Tab By Mouth At Bedtime 5)  Levoxyl 50 Mcg Tabs (Levothyroxine Sodium) .... Take 1 Tablet By Mouth Once A Day 6)  Metoprolol Tartrate 50 Mg  Tabs (Metoprolol Tartrate) .... Take 1 Tablet By Mouth Two Times A Day 7)  Crestor 10 Mg Tabs (Rosuvastatin Calcium) .... Take 1 Tab By Mouth At Bedtime 8)  Lamictal 100 Mg Tabs (Lamotrigine) .... One By Mouth Daily 9)  Amlodipine Besy-Benazepril Hcl 5-40 Mg Caps (Amlodipine Besy-Benazepril Hcl) .... Take 1 Tablet By Mouth Once A Day 10)  Trazodone Hcl 150 Mg Tabs (Trazodone Hcl) .... Take 1 Tab By Mouth At Bedtime 11)  Ativan 1 Mg Tabs (Lorazepam) .... Take 1 Tablet By Mouth Four Times A Day 12)  Metoclopramide Hcl 5 Mg Tabs (Metoclopramide Hcl) .... One By Mouth Qach and Qhs 13)  Flexeril 10 Mg Tabs (Cyclobenzaprine Hcl) .... Take 1 Tablet By Mouth Two Times A Day 14)  Plavix 75 Mg Tabs (Clopidogrel Bisulfate) .... Once Daily 15)  Miacalcin 200 Unit/ml Soln (Calcitonin (Salmon)) .... Once Daily 16)  Lomotil 2.5-0.025 Mg Tabs (Diphenoxylate-Atropine) .[name].. 1 By Mouth Two Times A Day As Needed For Diarrhea 17)  Glipizide 10 Mg Tabs (Glipizide) .... Take 1 Tablet By Mouth Two Times A Day 18)  Zofran 4 Mg Tabs (Ondansetron Hcl) .... Take 1 Tablet By Mouth Once A Day As Needed Severe Nausea 19)  Onglyza 5 Mg Tabs (Saxagliptin Hcl) .... Take  1 Tablet By Mouth Once A Day 20)  Aciphex 20 Mg Tbec (Rabeprazole Sodium) .... Once Daily 21)  Nystop 100000 Unit/gm Powd (Nystatin) .... Two Times A Day 22)  Furosemide 20 Mg Tabs (Furosemide) .... Once Daily As Needed 23)  Klor-Con M20 20 Meq Cr-Tabs (Potassium Chloride Crys Cr) .... As Needed With Furosemide 24)  Vitamin D 50000u .... Q Week 25)  Antivert 12.5 Mg Tabs (Meclizine Hcl) .... One Tab By Mouth Three Times A Day As Needed For Dizziness 26)  Voltaren 1 % Gel (Diclofenac Sodium) .... Apply Twice Daily To Affected Area As Neede 27)  Hydrocodone-Acetaminophen 5-500 Mg Tabs (Hydrocodone-Acetaminophen) .... One Tab By Mouth Two Times A Day 28)  First-Dukes Mouthwash  Susp (Diphenhyd-Hydrocort-Nystatin) .... 2 Teaspoons Three Times Daily As Needed For  Horseness 29)  Clonidine Hcl 0.3 Mg Tabs (Clonidine Hcl) .... One Tablet At 8am, One At 4pm and One At  30)  Thiothixene 2 Mg Caps (Thiothixene) .... Take 1 Tab By Mouth At Bedtime  Allergies (verified): 1)  ! * Keflex 2)  ! Pcn 3)  ! Pyridium 4)  * Milk Products  Review of Systems      See HPI General:  Complains of fatigue and malaise. Eyes:  Denies discharge and red eye. CV:  Complains of shortness of breath with exertion; denies difficulty breathing while lying down, palpitations, and swelling of feet. Resp:  Complains of shortness of breath. GI:  Complains of abdominal pain, indigestion, and nausea; denies constipation, diarrhea, and vomiting. GU:  Denies dysuria and urinary frequency. MS:  Complains of joint pain, low back pain, mid back pain, muscle weakness, and stiffness. Neuro:  Complains of falling down, headaches, memory loss, poor balance, and weakness; denies sensation of room spinning, tingling, and tremors. Psych:  Complains of anxiety, depression, easily tearful, irritability, and mental problems; denies suicidal thoughts/plans, thoughts of violence, unusual visions or sounds, and thoughts /plans of harming others. Endo:  Complains of excessive thirst and excessive urination; 3 week h/o elevated blood sugars, 0ver 200, malodoros urine. Heme:  Denies abnormal bruising and bleeding. Allergy:  Complains of seasonal allergies.  Physical Exam  General:  Well-developed,obese,in no acute distress; alert,appropriate and cooperative throughout examination HEENT: No facial asymmetry,  EOMI, No sinus tenderness, TM's Clear, oropharynx  pink and moist.   Chest: Clear to auscultation bilaterally.  CVS: S1, S2, No murmurs, No S3.   Abd: Soft, Nontender.  MS: decreased  ROM spine, hips, Ext: No edema.  right  calf tender andswollen CNS: CN 2-12 intact, reduced power tone and sensationin lower ext  Skin: Intact, no visible lesions or rashes.  Psych: Good eye contact,  normal affect.  Memory loss, both anxious and  depressed appearing.    Impression & Recommendations:  Problem # 1:  FLANK PAIN (ICD-789.09) Assessment Deteriorated  Her updated medication list for this problem includes:    Flexeril 10 Mg Tabs (Cyclobenzaprine hcl) .[name]... Take 1 tablet by mouth two times a day    Hydrocodone-acetaminophen 5-500 Mg Tabs (Hydrocodone-acetaminophen) ..... One tab by mouth two times a day  Future Orders: Radiology Referral (Radiology) ... 03/30/2010  Problem # 2:  RUQ PAIN (ICD-789.01) Assessment: Comment Only  Her updated medication list for this problem includes:    Metoclopramide Hcl 5 Mg Tabs (Metoclopramide hcl) ..... One by mouth qach and qhs  Problem # 3:  CALF PAIN, RIGHT (ICD-729.5) Assessment: Comment Only  Orders: Radiology Referral (Radiology) Ketorolac-Toradol 15mg  [phone]) Admin of Therapeutic Inj  intramuscular or subcutaneous (64332)  Problem # 4:  CHEST PAIN UNSPECIFIED (ICD-786.50) Assessment: Comment Only  Orders: Radiology Referral (Radiology)  Problem # 5:  HYPERLIPIDEMIA (ICD-272.4) Assessment: Comment Only  Her updated medication list for this problem includes:    Crestor 10 Mg Tabs (Rosuvastatin calcium) .[name]... Take 1 tab by mouth at bedtime Low fat dietdiscussed and encouraged , labs due Orders: T-Hepatic Function [phone])  Labs Reviewed: SGOT: 22 (09/14/2009)   SGPT: 21 (09/14/2009)   HDL:43 (09/14/2009), 46 (03/18/2009)  LDL:73 (09/14/2009), 69 (03/18/2009)  Chol:158 (09/14/2009), 135 (03/18/2009)  Trig:211 (09/14/2009), 101 (03/18/2009)  Problem # 6:  HYPERTENSION (ICD-401.9) Assessment: Improved  Her updated medication list for this problem includes:    Metoprolol Tartrate 50 Mg Tabs (Metoprolol tartrate) .[name]... Take 1 tablet by mouth two times a day    Amlodipine Besy-benazepril Hcl 5-40 Mg Caps (Amlodipine besy-benazepril hcl) .[name]... Take 1 tablet by mouth once a day    Furosemide 20 Mg Tabs  (Furosemide) ..... Once daily as needed    Clonidine Hcl 0.3 Mg Tabs (Clonidine hcl) ..... One tablet at 8am, one at 4pm and one at     Hydrochlorothiazide 12.5 Mg Tabs (Hydrochlorothiazide) .[name]... Take 1 tablet by mouth once a day  Orders: T-Basic Metabolic Panel [phone]) Creatinine-[facility] [phone]) Medicare Electronic Prescription ([id number])Future Orders: Radiology Referral (Radiology) ... 03/30/2010  BP today: 140/80 Prior BP: 180/90 (03/10/2010)  Labs Reviewed: K+: 4.7 (12/21/2009) Creat: : 0.90 (12/21/2009)   Chol: 158 (09/14/2009)   HDL: 43 (09/14/2009)   LDL: 73 (09/14/2009)   TG: 211 (09/14/2009)  Problem # 7:  DIABETES MELLITUS (ICD-250.00) Assessment: Comment Only  Her updated medication list for this problem includes:    Amlodipine Besy-benazepril Hcl 5-40 Mg Caps (Amlodipine besy-benazepril hcl) .[name]... Take 1 tablet by mouth once a day    Glipizide 10 Mg Tabs (Glipizide) .[name]... Take 1 tablet by mouth two times a day    Onglyza 5 Mg Tabs (Saxagliptin hcl) .[name]... Take 1 tablet by mouth once a day    Metformin Hcl 500 Mg Xr24h-tab (Metformin hcl) ..... One twice daily at breakfast and supper Patient advised to reduce carbs and sweets, commit to regular physical activity, take meds as prescribed, test blood sugars as directed, and attempt to lose weight , to improve blood sugar control.  Orders: T- Hemoglobin A1C ([id number]) Medicare Electronic Prescription [phone])  Labs Reviewed: Creat: 0.90 (12/21/2009)    Reviewed HgBA1c results: 7.2 (12/21/2009)  8.2 (09/14/2009)  Complete Medication List: 1)  Singulair 10 Mg Tabs (Montelukast sodium) .... One tab by mouth once daily 2)  Zoloft 50 Mg Tabs (Sertraline hcl) .... 3 tabs once daily 3)  Evoxac 30 Mg Caps (Cevimeline hcl) .... One cap by mouth tid 4)  Mirtazapine 30 Mg Tabs (mirtazapine)  .... Take 1 tab by mouth at bedtime 5)  Levoxyl 50 Mcg Tabs (Levothyroxine sodium) .... Take 1 tablet by mouth once a  day 6)  Metoprolol Tartrate 50 Mg Tabs (Metoprolol tartrate) .... Take 1 tablet by mouth two times a day 7)  Crestor 10 Mg Tabs (Rosuvastatin calcium) .... Take 1 tab by mouth at bedtime 8)  Lamictal 100 Mg Tabs (Lamotrigine) .... One by mouth daily 9)  Amlodipine Besy-benazepril Hcl 5-40 Mg Caps (Amlodipine besy-benazepril hcl) .... Take 1 tablet by mouth once a day 10)  Trazodone Hcl 150 Mg Tabs (Trazodone hcl) .... Take 1 tab by mouth at bedtime 11)  Ativan 1 Mg Tabs (Lorazepam) .... Take 1 tablet by  mouth four times a day 12)  Metoclopramide Hcl 5 Mg Tabs (Metoclopramide hcl) .... One by mouth qach and qhs 13)  Flexeril 10 Mg Tabs (Cyclobenzaprine hcl) .... Take 1 tablet by mouth two times a day 14)  Plavix 75 Mg Tabs (Clopidogrel bisulfate) .... Once daily 15)  Miacalcin 200 Unit/ml Soln (Calcitonin (salmon)) .... Once daily 16)  Lomotil 2.5-0.025 Mg Tabs (Diphenoxylate-atropine) .[name].. 1 by mouth two times a day as needed for diarrhea 17)  Glipizide 10 Mg Tabs (Glipizide) .... Take 1 tablet by mouth two times a day 18)  Zofran 4 Mg Tabs (Ondansetron hcl) .... Take 1 tablet by mouth once a day as needed severe nausea 19)  Onglyza 5 Mg Tabs (Saxagliptin hcl) .... Take 1 tablet by mouth once a day 20)  Aciphex 20 Mg Tbec (Rabeprazole sodium) .... Once daily 21)  Nystop 100000 Unit/gm Powd (Nystatin) .... Two times a day 22)  Furosemide 20 Mg Tabs (Furosemide) .... Once daily as needed 23)  Klor-con M20 20 Meq Cr-tabs (Potassium chloride crys cr) .... As needed with furosemide 24)  Vitamin D 50000u  .... Q week 25)  Antivert 12.5 Mg Tabs (Meclizine hcl) .... One tab by mouth three times a day as needed for dizziness 26)  Voltaren 1 % Gel (Diclofenac sodium) .... Apply twice daily to affected area as neede 27)  Hydrocodone-acetaminophen 5-500 Mg Tabs (Hydrocodone-acetaminophen) .... One tab by mouth two times a day 28)  First-dukes Mouthwash Susp (Diphenhyd-hydrocort-nystatin) .... 2 teaspoons  three times daily as needed for horseness 29)  Clonidine Hcl 0.3 Mg Tabs (Clonidine hcl) .... One tablet at 8am, one at 4pm and one at  30)  Thiothixene 2 Mg Caps (Thiothixene) .... Take 1 tab by mouth at bedtime 31)  Metformin Hcl 500 Mg Xr24h-tab (Metformin hcl) .... One twice daily at breakfast and supper 32)  Hydrochlorothiazide 12.5 Mg Tabs (Hydrochlorothiazide) .... Take 1 tablet by mouth once a day  Other Orders: T-Amylase ([id number]) T-Lipase (09811-91478) T-CBC w/Diff (29562-13086) TLB-H. Pylori Abs(Helicobacter Pylori) (86677-HELICO) Urinalysis (57846-96295)  Patient Instructions: 1)  Please schedule a follow-up appointment in 1 month. 2)  BMP prior to visit, ICD-9:, amylase and lipase  today 3)  Hepatic Panel prior to visit, ICD-9: 4)  HbgA1C prior to visit, ICD-9: 5)  CBC  today and H pylori 6)  Chest CT scan and venous doppler of right calf evaluate pain and dyspnea, today. 7)  RUQ ultrasound to eval right flank pain  Prescriptions: HYDROCHLOROTHIAZIDE 12.5 MG TABS (HYDROCHLOROTHIAZIDE) Take 1 tablet by mouth once a day  #30 x 3   Entered and Authorized by:   Syliva Overman MD   Signed by:   Syliva Overman MD on 03/31/2010   Method used:   Electronically to        [facility] (retail)       [address]       [place], [place]  [address]       Ph: [phone]       Fax: [phone]   RxID:   [id number] METFORMIN HCL 500 MG XR24H-TAB (METFORMIN HCL) one twice daily at breakfast and supper  #60 x 3   Entered and Authorized by:   Syliva Overman MD   Signed by:   Syliva Overman MD on 03/31/2010   Method used:   Electronically to        [facility] (retail)  [address] [place]  [address]       Ph: [phone]       Fax: [phone]   RxID:   [id number]    Medication Administration  Injection # 1:    Medication: Ketorolac-Toradol 15mg      Diagnosis: CALF PAIN, RIGHT (ICD-729.5)    Route: IM    Site: LUOQ gluteus    Exp Date: 03/14/2011    Lot #: [id number]    Mfr: [email or web address]    Comments: toradol 30mg  given    Patient tolerated injection without complications    Given by: Adella Hare LPN (March 29, 2010 11:36 AM)  Orders Added: 1)  Est. Patient Level IV [99214] 2)  T-Basic Metabolic Panel [80048-22910] 3)  T-Amylase [82150-23210] 4)  T-Lipase [83690-23215] 5)  T-Hepatic Function [80076-22960] 6)  T-CBC w/Diff [34742-59563] 7)  T- Hemoglobin A1C [83036-23375] 8)  TLB-H. Pylori Abs(Helicobacter Pylori) [86677-HELICO] 9)  Radiology Referral [Radiology] 10)  Radiology Referral [Radiology] 11)  Creatinine-[facility] [87564-33295] 12)  Ketorolac-Toradol 15mg  [J1885] 13)  Admin of Therapeutic Inj  intramuscular or subcutaneous [96372] 14)  Urinalysis [81003-65000] 15)  Radiology Referral [Radiology] 16)  Medicare Electronic Prescription [G8553]     Medication Administration  Injection # 1:    Medication: Ketorolac-Toradol 15mg     Diagnosis: CALF PAIN, RIGHT (ICD-729.5)    Route: IM    Site: LUOQ gluteus    Exp Date: 03/14/2011    Lot #: [id number]    Mfr: [email or web address]    Comments: toradol 30mg  given    Patient tolerated injection without complications    Given by: Adella Hare LPN (March 29, 2010 11:36 AM)  Orders Added: 1)  Est. Patient Level IV [99214] 2)  T-Basic Metabolic Panel [80048-22910] 3)  T-Amylase [82150-23210] 4)  T-Lipase [83690-23215] 5)  T-Hepatic Function [80076-22960] 6)  T-CBC w/Diff [06301-60109] 7)  T- Hemoglobin A1C [83036-23375] 8)  TLB-H. Pylori Abs(Helicobacter Pylori) [86677-HELICO] 9)  Radiology Referral [Radiology] 10)  Radiology Referral [Radiology] 11)  Creatinine-[facility] [32355-73220] 12)  Ketorolac-Toradol 15mg  [J1885] 13)  Admin of Therapeutic Inj  intramuscular or subcutaneous [96372] 14)  Urinalysis [81003-65000] 15)  Radiology Referral [Radiology] 16)  Medicare Electronic  Prescription [G8553]  Laboratory Results   Urine Tests  Date/Time Received: March 29, 2010 11:55 AM  Date/Time Reported: March 29, 2010 11:55 AM   Routine Urinalysis   Color: yellow Appearance: Clear Glucose: negative   (Normal Range: Negative) Bilirubin: negative   (Normal Range: Negative) Ketone: negative   (Normal Range: Negative) Spec. Gravity: >=1.030   (Normal Range: 1.003-1.035) Blood: negative   (Normal Range: Negative) pH: 5.5   (Normal Range: 5.0-8.0) Protein: negative   (Normal Range: Negative) Urobilinogen: 0.2   (Normal Range: 0-1) Nitrite: negative   (Normal Range: Negative) Leukocyte Esterace: negative   (Normal Range: Negative)

## 2010-04-20 NOTE — Letter (Signed)
Summary: [facility]   Imported By: Lind Guest 04/11/2010 15:32:59  _____________________________________________________________________  External Attachment:    Type:   Image     Comment:   External Document

## 2010-04-20 NOTE — Letter (Signed)
Summary: [facility]   Imported By: Lind Guest 04/10/2010 12:57:42  _____________________________________________________________________  External Attachment:    Type:   Image     Comment:   External Document

## 2010-04-20 NOTE — Miscellaneous (Signed)
Summary: Orders Update  Clinical Lists Changes  Orders: Added new Test order of T-Lipid Profile (80061-22930) - Signed 

## 2010-04-22 ENCOUNTER — Encounter: Payer: Self-pay | Admitting: Family Medicine

## 2010-04-28 NOTE — Letter (Signed)
Summary: obstructive sleep apnea  obstructive sleep apnea   Imported By: Lind Guest 04/22/2010 09:04:49  _____________________________________________________________________  External Attachment:    Type:   Image     Comment:   External Document

## 2010-04-28 NOTE — Assessment & Plan Note (Addendum)
Summary: F/U 3 MON DYSPHAGIA/LAW   Vital Signs:  Patient profile:   60 year old female Menstrual status:  hysterectomy Height:      62 inches Weight:      169.5 pounds BMI:     31.11 Temp:     98.1 degrees F oral Pulse rate:   88 / minute BP sitting:   164 / 88  (left arm) Cuff size:   regular  Vitals Entered By: Hendricks Limes LPN (April 19, 2010 10:35 AM)  Visit Type:  Follow-up Visit Primary Care Provider:  Lodema Hong  CC:  follow up- difficulty swallowing.  History of Present Illness: Ms. Kyer presents today in f/u. Has hx significant for gastroparesis, GERD, IBS, chronic anemia, and dysphagia. She has quite a complicated history, with multiple medications. She reports today that she has lower abdominal pain, "crampy", throbs when eating, X a few weeks. BM 3-4 per day, normal. Has hx of IBS. No problems with reflux. Experiences dysphagia that has worsened over past week while eating. Trying to stick with more liquids than solids. +nausea, does have hx of gastroparesis. Taking reglan prior to meals. Feels like nausea has worsened. Detailed below are studies done thus far:  03/25/2009: BPE: 13mm pill lodged, unable to pass, esophageal dysmotility Failed manometry at [facility].  MBSS 04/06/09: minimal oral and swallowing dysfunction GES 04/2009: poor gastric emptying: started on Reglan EGD 04/2009: normal, retained food, small H.H EGD/ED/TCS 10/2009: non-critical Schatzki's ring, s/p dilatation, left-sided diverticula  03/30/2010 CT: benign findings for abdominal etiology                  [name]: benign H/H 10.6/33.9, AP very mildly elevated at 124, amylase/lipase nl   Current Medications (verified): 1)  Singulair 10 Mg  Tabs (Montelukast Sodium) .... One Tab By Mouth Once Daily 2)  Zoloft 50 Mg Tabs (Sertraline Hcl) .... 3 Tabs Once Daily 3)  Evoxac 30 Mg Caps (Cevimeline Hcl) .... One Cap By Mouth Tid 4)  Mirtazapine 30 Mg Tabs (Mirtazapine) .... Take 1 Tab By Mouth At Bedtime 5)   Levoxyl 50 Mcg Tabs (Levothyroxine Sodium) .... Take 1 Tablet By Mouth Once A Day 6)  Metoprolol Tartrate 50 Mg Tabs (Metoprolol Tartrate) .... Take 1 Tablet By Mouth Two Times A Day 7)  Crestor 10 Mg Tabs (Rosuvastatin Calcium) .... Take 1 Tab By Mouth At Bedtime 8)  Lamictal 100 Mg Tabs (Lamotrigine) .... One By Mouth Daily 9)  Amlodipine Besy-Benazepril Hcl 5-40 Mg Caps (Amlodipine Besy-Benazepril Hcl) .... Take 1 Tablet By Mouth Once A Day 10)  Trazodone Hcl 150 Mg Tabs (Trazodone Hcl) .... Take 1 Tab By Mouth At Bedtime 11)  Ativan 1 Mg Tabs (Lorazepam) .... Take 1 Tablet By Mouth Four Times A Day 12)  Metoclopramide Hcl 5 Mg Tabs (Metoclopramide Hcl) .... One By Mouth Qach and Qhs 13)  Flexeril 10 Mg Tabs (Cyclobenzaprine Hcl) .... Take 1 Tablet By Mouth Two Times A Day 14)  Plavix 75 Mg Tabs (Clopidogrel Bisulfate) .... Once Daily 15)  Miacalcin 200 Unit/ml Soln (Calcitonin ([place])) .... Once Daily 16)  Lomotil 2.5-0.025 Mg Tabs (Diphenoxylate-Atropine) .[name].. 1 By Mouth Two Times A Day As Needed For Diarrhea 17)  Glipizide 10 Mg Tabs (Glipizide) .... Take 1 Tablet By Mouth Two Times A Day 18)  Zofran 4 Mg Tabs (Ondansetron Hcl) .... Take 1 Tablet By Mouth Once A Day As Needed Severe Nausea 19)  Onglyza 5 Mg Tabs (Saxagliptin Hcl) .... Take 1 Tablet By  Mouth Once A Day 20)  Aciphex 20 Mg Tbec (Rabeprazole Sodium) .... Once Daily 21)  Nystop 100000 Unit/gm Powd (Nystatin) .... Two Times A Day 22)  Furosemide 20 Mg Tabs (Furosemide) .... Once Daily As Needed 23)  Klor-Con M20 20 Meq Cr-Tabs (Potassium Chloride Crys Cr) .... As Needed With Furosemide 24)  Vitamin D 50000u .... Q Week 25)  Antivert 12.5 Mg Tabs (Meclizine Hcl) .... One Tab By Mouth Three Times A Day As Needed For Dizziness 26)  Voltaren 1 % Gel (Diclofenac Sodium) .... Apply Twice Daily To Affected Area As Neede 27)  Hydrocodone-Acetaminophen 5-500 Mg Tabs (Hydrocodone-Acetaminophen) .... One Tab By Mouth Two Times A Day 28)   First-Dukes Mouthwash  Susp (Diphenhyd-Hydrocort-Nystatin) .... 2 Teaspoons Three Times Daily As Needed For Horseness 29)  Clonidine Hcl 0.3 Mg Tabs (Clonidine Hcl) .... One Tablet At 8am, One At 4pm and One At  30)  Thiothixene 2 Mg Caps (Thiothixene) .... Take 1 Tab By Mouth At Bedtime 31)  Metformin Hcl 500 Mg Xr24h-Tab (Metformin Hcl) .... One Twice Daily At Breakfast and Supper 32)  Hydrochlorothiazide 12.5 Mg Tabs (Hydrochlorothiazide) .... Take 1 Tablet By Mouth Once A Day  Allergies (verified): 1)  ! * Keflex 2)  ! Pcn 3)  ! Pyridium 4)  * Milk Products  Past History:  Past Medical History: Last updated: 12/27/2009 Bipolar d/o CVA H/O pancreatitis due to Depakote with normal EUS in 2006 OSTEOPOROSIS (ICD-733.00) SLEEP APNEA (ICD-780.57) BACK PAIN, CHRONIC (ICD-724.5) NAUSEA, CHRONIC (ICD-787.02) DIABETES MELLITUS (ICD-250.00) TRIGGER FINGER (ICD-727.3) Anxiety Disorder Hypertension Migraines Simple adenomas and hyperplastic polyps on TCS in 2001  TCS  9/08 by Dr. Lina Sar for diarrhea.  Bx negative for microscopic colitits.  She had diverticulsis.   EGD with ED 10/2009 with RMR: non-critical Schatzki ring, rectal polyp, left-sided diverticula, biopsies benign Glaucoma  Past Surgical History: Last updated: 05/12/2008 hysterectomy cholecystectomy ovarian cystectomy carpal tunnell release left by Dr. Romeo Apple 09/02/04 open right carpal tunnel release by Dr. Romeo Apple 07/22/04 breast reduction surgery B cataract surgery in 2009 Biopsy of thyroid gland 2009 surgical excision of 3 tumors from r thigh and R buttock and left upper thigh  019/2010  Review of Systems General:  Denies fever, chills, and anorexia. Eyes:  Denies blurring, irritation, and discharge. ENT:  Complains of difficulty swallowing; denies sore throat and hoarseness. CV:  Denies chest pains and syncope. Resp:  Denies dyspnea at rest and wheezing. GI:  Complains of difficulty  swallowing, nausea, and abdominal pain; denies pain on swallowing and diarrhea. GU:  Denies urinary burning and urinary frequency. MS:  Denies joint pain / LOM, joint swelling, and joint stiffness. Derm:  Denies rash, itching, and dry skin. Neuro:  Denies weakness and syncope. Psych:  Denies depression and anxiety. Endo:  Denies cold intolerance and heat intolerance.  Physical Exam  General:  NAD, drowsy, flat affect Head:  Normocephalic and atraumatic. Eyes:  PERRLA, no icterus. Lungs:  Clear throughout to auscultation. Heart:  Regular rate and rhythm; no murmurs, rubs,  or bruits. Abdomen:  +BS, soft, non-tender, non-distended. No HSM, no rebound or guarding Msk:  Symmetrical with no gross deformities. Normal posture. Neurologic:  Alert and  oriented x4;  grossly normal neurologically. Psych:  flat affect.    Impression & Recommendations:  Problem # 1:  ABDOMINAL PAIN, GENERALIZED (ICD-39.78)  60 year old female with complicated hx. c/o lower abdominal pain, crampy, worse with eating. Does have hx of IBS. last EGD/TCS recently in Aug 2011. See  HPI for full details. On multiple medications. Likely functional abdominal pain vs IBS. CT and [name] without findings to explain pain. Pt on multiple medications, hesitant to add additional currently. may benefit from referral to pain management.   Consider pain management referral   Return in 3 mos.   Orders: Est. Patient Level II (72536)  Problem # 2:  DYSPHAGIA (ICD-787.29)  complicated hx with most recent EGD/ED in Aug 2011. Has undergone UGI/BPE in past with lodging of pill. Modified swallow completed with minimal dysfunction. Failed manometry at [facility]. Likely needs referral again to [facility]. Will d/w Dr. Jena Gauss  Orders: Est. Patient Level II [phone])  Problem # 3:  ANEMIA (ICD-285.9) hx of anemia, last H/H 12/17/31.9, was heme + in August, underwent EGD/TCS. ?need for capsule endoscopy in future to complete GI work-up from anemia  standpoint. Again, will d/w Dr. Jena Gauss due to complexity of case.   Problem # 4:  GASTROPARESIS (ICD-536.3)  +nausea, chronic, worsening over past weeks. pt compliant with gastroparesis diet per report. Pt is on multiple meds. Discussed in detail Reglan and side effects. Currently taking 5mg  prior to meals and bedtime. Will do small increase to 10 mg for just morning and evening dose for short course. Pt on multiple meds; trial for a short time only.   Orders: Est. Patient Level II (47425) Prescriptions: REGLAN 5 MG TABS (METOCLOPRAMIDE HCL) take 2 tabs before breakfast, 1 tab before lunch, 2 tabs before dinner, 1 tab at bedtime  #180 x 1   Entered and Authorized by:   Gerrit Halls NP   Signed by:   Gerrit Halls NP on 04/19/2010   Method used:   Faxed to ...       [facility] (retail)       [address]       [place], [place]  [address]       Ph: [phone]       Fax: [phone]   RxID:   [id number]    Orders Added: 1)  Est. Patient Level II [25427]  Appended Document: F/U 3 MON DYSPHAGIA/LAW 3 MONTH F/U OPV IS IN THE COMPUTER  Appended Document: F/U 3 MON DYSPHAGIA/LAW Please have pt do the following: 1. Updated CBC 2. ifobt  After review of above, will determine need for capsule study. If capsule study required, will need to consider EGD placement secondary to hx of gastroparesis, along with large bore dilatation. After this all completed, then consider [facility] referral again for manometry.   Above d/w Dr. Jena Gauss.     Appended Document: F/U 3 MON DYSPHAGIA/LAW tried to call pt- NA, noticed pt was at OV with Dr. Lodema Hong, called Dr.Simpsons office and spoke with Kennon Rounds- She will tell pt to go by lab after she leaves their office. ifobt in mail to pt. Lab order faxed to lab

## 2010-04-28 NOTE — Progress Notes (Signed)
Summary: BLOOD PRESSURE AND BLOOD SUGARS  Phone Note Call from Patient   Summary of Call: BP IS UP AND NOT  DOING  TO BAD AND HER SUGAR IS NOT DOING  TO WELL CALL BACK AT [phone]  Follow-up for Phone Call        states she just wants dr to know blood pressure and sugar has been running up and has dull headache  advised patient she may want to move her appt up and she states no she just wants dr to know Follow-up by: Adella Hare LPN,  April 19, 2010 2:54 PM    Additional Follow-up for Phone Call Additional follow up Details #2::    noted, and I agree with your recommendation to her Follow-up by: Syliva Overman MD,  April 19, 2010 3:02 PM

## 2010-05-03 LAB — CONVERTED CEMR LAB
Cholesterol: 155 mg/dL (ref 0–200)
HDL: 47 mg/dL (ref >39)
Total CHOL/HDL Ratio: 3.3
Triglycerides: 200 mg/dL — ABNORMAL HIGH (ref <150)

## 2010-05-05 ENCOUNTER — Encounter: Payer: Self-pay | Admitting: Family Medicine

## 2010-05-06 ENCOUNTER — Encounter: Payer: Self-pay | Admitting: Family Medicine

## 2010-05-06 ENCOUNTER — Encounter: Payer: Self-pay | Admitting: Gastroenterology

## 2010-05-06 ENCOUNTER — Telehealth: Payer: Self-pay | Admitting: Family Medicine

## 2010-05-06 ENCOUNTER — Ambulatory Visit (INDEPENDENT_AMBULATORY_CARE_PROVIDER_SITE_OTHER): Payer: Medicare Other | Admitting: Family Medicine

## 2010-05-06 DIAGNOSIS — IMO0002 Reserved for concepts with insufficient information to code with codable children: Secondary | ICD-10-CM

## 2010-05-06 DIAGNOSIS — I10 Essential (primary) hypertension: Secondary | ICD-10-CM

## 2010-05-06 DIAGNOSIS — E119 Type 2 diabetes mellitus without complications: Secondary | ICD-10-CM

## 2010-05-06 DIAGNOSIS — M549 Dorsalgia, unspecified: Secondary | ICD-10-CM

## 2010-05-07 ENCOUNTER — Encounter: Payer: Self-pay | Admitting: Family Medicine

## 2010-05-07 LAB — CONVERTED CEMR LAB: Microalb, Ur: 0.97 mg/dL (ref 0.00–1.89)

## 2010-05-09 ENCOUNTER — Telehealth: Payer: Self-pay | Admitting: Family Medicine

## 2010-05-10 ENCOUNTER — Ambulatory Visit (HOSPITAL_COMMUNITY)
Admission: RE | Admit: 2010-05-10 | Discharge: 2010-05-10 | Disposition: A | Discharge status: Home / Self Care | Payer: Medicare Other | Source: Ambulatory Visit | Attending: Family Medicine | Admitting: Family Medicine

## 2010-05-10 ENCOUNTER — Telehealth: Payer: Self-pay | Admitting: Family Medicine

## 2010-05-10 DIAGNOSIS — E119 Type 2 diabetes mellitus without complications: Secondary | ICD-10-CM | POA: Insufficient documentation

## 2010-05-10 DIAGNOSIS — M545 Low back pain, unspecified: Secondary | ICD-10-CM | POA: Insufficient documentation

## 2010-05-10 DIAGNOSIS — IMO0001 Reserved for inherently not codable concepts without codable children: Secondary | ICD-10-CM | POA: Insufficient documentation

## 2010-05-10 DIAGNOSIS — M6281 Muscle weakness (generalized): Secondary | ICD-10-CM | POA: Insufficient documentation

## 2010-05-10 DIAGNOSIS — I10 Essential (primary) hypertension: Secondary | ICD-10-CM | POA: Insufficient documentation

## 2010-05-10 NOTE — Progress Notes (Signed)
  Phone Note Call from Patient   Caller: Patient Summary of Call: patient requesting refill on nausea med but there is no nausea med on med list Initial call taken by: Adella Hare LPN,  May 06, 2010 3:31 PM  Follow-up for Phone Call        let her know we suggest she cakllsd her [facility]I doc for this med since [facility] do not supply it anymore for her Follow-up by: Syliva Overman MD,  May 06, 2010 4:15 PM  Additional Follow-up for Phone Call Additional follow up Details #1::        called patient, no answer Additional Follow-up by: Adella Hare LPN,  May 06, 2010 4:23 PM     Appended Document:  patient aware

## 2010-05-10 NOTE — Letter (Signed)
Summary: bedside commode  bedside commode   Imported By: Lind Guest 05/06/2010 11:21:25  _____________________________________________________________________  External Attachment:    Type:   Image     Comment:   External Document

## 2010-05-10 NOTE — Miscellaneous (Addendum)
Summary: Orders Update  Clinical Lists Changes  Orders: Added new Test order of T-CBC w/Diff (85025-10010) - Signed 

## 2010-05-11 ENCOUNTER — Telehealth: Payer: Self-pay | Admitting: Family Medicine

## 2010-05-12 ENCOUNTER — Encounter (INDEPENDENT_AMBULATORY_CARE_PROVIDER_SITE_OTHER): Payer: Medicare Other | Admitting: Internal Medicine

## 2010-05-12 ENCOUNTER — Encounter: Payer: Self-pay | Admitting: Internal Medicine

## 2010-05-12 ENCOUNTER — Ambulatory Visit (INDEPENDENT_AMBULATORY_CARE_PROVIDER_SITE_OTHER): Payer: Medicare Other | Admitting: Otolaryngology

## 2010-05-12 ENCOUNTER — Encounter (INDEPENDENT_AMBULATORY_CARE_PROVIDER_SITE_OTHER): Payer: Self-pay

## 2010-05-12 ENCOUNTER — Encounter: Payer: Self-pay | Admitting: Family Medicine

## 2010-05-12 DIAGNOSIS — D649 Anemia, unspecified: Secondary | ICD-10-CM

## 2010-05-12 LAB — CONVERTED CEMR LAB
Basophils Relative: 0 % (ref 0–1)
Hemoglobin: 11.2 g/dL — ABNORMAL LOW (ref 12.0–15.0)
Lymphocytes Relative: 39 % (ref 12–46)
Lymphs Abs: 3.3 10*3/uL (ref 0.7–4.0)
MCHC: 29.6 g/dL — ABNORMAL LOW (ref 30.0–36.0)
Monocytes Absolute: 0.7 10*3/uL (ref 0.1–1.0)
Monocytes Relative: 8 % (ref 3–12)
Neutro Abs: 4.3 10*3/uL (ref 1.7–7.7)
Neutrophils Relative %: 51 % (ref 43–77)
RBC: 5.23 M/uL — ABNORMAL HIGH (ref 3.87–5.11)
WBC: 8.4 10*3/uL (ref 4.0–10.5)

## 2010-05-13 ENCOUNTER — Encounter: Payer: Self-pay | Admitting: Family Medicine

## 2010-05-16 ENCOUNTER — Emergency Department (HOSPITAL_COMMUNITY)
Admission: EM | Admit: 2010-05-16 | Discharge: 2010-05-16 | Disposition: A | Discharge status: Home / Self Care | Payer: Medicare Other | Attending: Emergency Medicine | Admitting: Emergency Medicine

## 2010-05-16 ENCOUNTER — Emergency Department (HOSPITAL_COMMUNITY): Payer: Medicare Other

## 2010-05-16 DIAGNOSIS — R42 Dizziness and giddiness: Secondary | ICD-10-CM | POA: Insufficient documentation

## 2010-05-16 DIAGNOSIS — Z8673 Personal history of transient ischemic attack (TIA), and cerebral infarction without residual deficits: Secondary | ICD-10-CM | POA: Insufficient documentation

## 2010-05-16 DIAGNOSIS — I10 Essential (primary) hypertension: Secondary | ICD-10-CM | POA: Insufficient documentation

## 2010-05-16 DIAGNOSIS — W1809XA Striking against other object with subsequent fall, initial encounter: Secondary | ICD-10-CM | POA: Insufficient documentation

## 2010-05-16 DIAGNOSIS — E119 Type 2 diabetes mellitus without complications: Secondary | ICD-10-CM | POA: Insufficient documentation

## 2010-05-16 DIAGNOSIS — Y92009 Unspecified place in unspecified non-institutional (private) residence as the place of occurrence of the external cause: Secondary | ICD-10-CM | POA: Insufficient documentation

## 2010-05-16 DIAGNOSIS — S0003XA Contusion of scalp, initial encounter: Secondary | ICD-10-CM | POA: Insufficient documentation

## 2010-05-16 DIAGNOSIS — R51 Headache: Secondary | ICD-10-CM | POA: Insufficient documentation

## 2010-05-16 LAB — GLUCOSE, CAPILLARY: Glucose-Capillary: 160 mg/dL — ABNORMAL HIGH (ref 70–99)

## 2010-05-17 ENCOUNTER — Ambulatory Visit (HOSPITAL_COMMUNITY)
Admission: RE | Admit: 2010-05-17 | Discharge: 2010-05-17 | Disposition: A | Discharge status: Home / Self Care | Payer: Medicare Other | Source: Ambulatory Visit | Attending: Family Medicine | Admitting: Family Medicine

## 2010-05-17 ENCOUNTER — Telehealth: Payer: Self-pay | Admitting: Family Medicine

## 2010-05-17 DIAGNOSIS — IMO0001 Reserved for inherently not codable concepts without codable children: Secondary | ICD-10-CM | POA: Insufficient documentation

## 2010-05-17 DIAGNOSIS — I10 Essential (primary) hypertension: Secondary | ICD-10-CM | POA: Insufficient documentation

## 2010-05-17 DIAGNOSIS — R262 Difficulty in walking, not elsewhere classified: Secondary | ICD-10-CM | POA: Insufficient documentation

## 2010-05-17 DIAGNOSIS — M545 Low back pain, unspecified: Secondary | ICD-10-CM | POA: Insufficient documentation

## 2010-05-17 DIAGNOSIS — E119 Type 2 diabetes mellitus without complications: Secondary | ICD-10-CM | POA: Insufficient documentation

## 2010-05-17 DIAGNOSIS — M6281 Muscle weakness (generalized): Secondary | ICD-10-CM | POA: Insufficient documentation

## 2010-05-19 ENCOUNTER — Ambulatory Visit (HOSPITAL_COMMUNITY)
Admission: RE | Admit: 2010-05-19 | Discharge: 2010-05-19 | Disposition: A | Discharge status: Home / Self Care | Payer: Medicare Other | Source: Ambulatory Visit | Attending: Family Medicine | Admitting: Family Medicine

## 2010-05-19 NOTE — Progress Notes (Signed)
Summary: test results  Phone Note Call from Patient   Summary of Call: needs for you to call her about her urine rest and needs a appoinment with a lung doctor Initial call taken by: Lind Guest,  May 10, 2010 1:07 PM  Follow-up for Phone Call        patient aware microalbumin normal Follow-up by: Adella Hare LPN,  May 10, 2010 1:55 PM

## 2010-05-19 NOTE — Progress Notes (Signed)
Summary: sleeping  Phone Note Call from Patient   Summary of Call: wants you to send her somewhere to see why she wants to sleep to much, like in Sunday school . Call her back to let her know Initial call taken by: Lind Guest,  May 09, 2010 11:28 AM  Follow-up for Phone Call        she already uses a cpap machine, her excessive sleep may be due to when she is taking her mental health meds, she needs to discuss with her psychiatrist Follow-up by: Syliva Overman MD,  May 09, 2010 12:14 PM  Additional Follow-up for Phone Call Additional follow up Details #1::        called patient, left message Additional Follow-up by: Everitt Amber LPN,  May 10, 2010 11:02 AM    Additional Follow-up for Phone Call Additional follow up Details #2::    Patient aware Follow-up by: Everitt Amber LPN,  May 10, 2010 1:42 PM

## 2010-05-19 NOTE — Letter (Signed)
Summary: [facility]  [facility]   Imported By: Lind Guest 05/13/2010 14:45:22  _____________________________________________________________________  External Attachment:    Type:   Image     Comment:   External Document

## 2010-05-19 NOTE — Letter (Signed)
Summary: Normal Results Letter  [facility]  [address]   [place], [place] [address]   Phone: [phone]  Fax: [phone]    May 12, 2010  [name] [address] [place] [address] [place], [place]  [address]  [place] 27-Apr-1950   Dear [name],    We just wanted to inform you that all of your blood tests were improving.  Please return stool sample that we mailed to you. If you have any questions, please call the office at [phone].   Thank you,    Hendricks Limes, LPN Cloria Spring, LPN  [facility] Ph: [phone]   Fax: [phone]

## 2010-05-19 NOTE — Assessment & Plan Note (Addendum)
Summary: dropped off stool  pt returned ifobt and result was negative.  Allergies: 1)  ! * Keflex 2)  ! Pcn 3)  ! Pyridium 4)  * Milk Products   Other Orders: Immuno-chemical Fecal Occult ([address])   Orders Added: 1)  Immuno-chemical Fecal Occult [82274]  Appended Document: dropped off stool mailed results letter to pt  Appended Document: dropped off stool how is pt doing with dysphagia?   Appended Document: dropped off stool spoke with pt- still having choking episodes, esp with meats and pills  Appended Document: dropped off stool let's bring pt in. need updated H&P. likely will need another EGD with large bore dilatation. After this completed, may refer to [facility] again for manometry. ifobt was negative, H/H improved.   Appended Document: dropped off stool pt aware of OV for 07/14/10 @ 1000 w/AS

## 2010-05-19 NOTE — Letter (Signed)
[facility]   Imported By: Lind Guest 05/10/2010 09:06:04  _____________________________________________________________________  External Attachment:    Type:   Image     Comment:   External Document

## 2010-05-19 NOTE — Progress Notes (Signed)
Summary: referral  Phone Note Call from Patient   Summary of Call: pt called and stated she was suppose to be referred to pulmonology. I didn't have a order. And also she states her eye glasses are broke. [phone] Initial call taken by: Rudene Anda,  May 09, 2010 3:25 PM  Follow-up for Phone Call        pls advise chest CT scan in January this year showed no nodule, mass or fluid, so she does not need to see a pulmonologist Follow-up by: Syliva Overman MD,  May 09, 2010 5:26 PM  Additional Follow-up for Phone Call Additional follow up Details #1::        called patient left message  Additional Follow-up by: Everitt Amber LPN,  May 10, 2010 11:01 AM    Additional Follow-up for Phone Call Additional follow up Details #2::    Patient aware Follow-up by: Everitt Amber LPN,  May 10, 2010 1:36 PM

## 2010-05-19 NOTE — Letter (Signed)
Summary: pre auth  pre auth   Imported By: Lind Guest 05/13/2010 10:08:00  _____________________________________________________________________  External Attachment:    Type:   Image     Comment:   External Document

## 2010-05-19 NOTE — Assessment & Plan Note (Signed)
Summary: F UP 1 MONTH   Vital Signs:  Patient profile:   60 year old female Menstrual status:  hysterectomy Height:      62 inches Weight:      167 pounds O2 Sat:      96 % Pulse rate:   91 / minute Pulse rhythm:   regular Resp:     16 per minute BP sitting:   132 / 70  (left arm) Cuff size:   regular  Vitals Entered By: Everitt Amber LPN (May 06, 2010 10:10 AM) CC: Follow up chronic problems   Primary Care Provider:  Lodema Hong  CC:  Follow up chronic problems.  History of Present Illness: Reports  that she has been fairly well. Denies recent fever or chills. Denies sinus pressure, nasal congestion , ear pain or sore throat. Denies chest congestion, or cough productive of sputum. Denies chest pain, palpitations, PND, orthopnea or leg swelling. Denies abdominal pain,she has chronic  nausea,denies  vomitting, diarrhea or constipation. Denies change in bowel movements or bloody stool. Denies dysuria , frequency, incontinence or hesitancy. Chronic   joint pain, with  reduced mobility, es[pescially of the spine c/o headaches,denies  vertigo, seizures. Denies depression, anxiety or insomnia. Denies  rash, lesions, or itch.     Current Medications (verified): 1)  Singulair 10 Mg  Tabs (Montelukast Sodium) .... One Tab By Mouth Once Daily 2)  Zoloft 50 Mg Tabs (Sertraline Hcl) .... 3 Tabs Once Daily 3)  Evoxac 30 Mg Caps (Cevimeline Hcl) .... One Cap By Mouth Tid 4)  Mirtazapine 30 Mg Tabs (Mirtazapine) .... Take 1 Tab By Mouth At Bedtime 5)  Levoxyl 50 Mcg Tabs (Levothyroxine Sodium) .... Take 1 Tablet By Mouth Once A Day 6)  Metoprolol Tartrate 50 Mg Tabs (Metoprolol Tartrate) .... Take 1 Tablet By Mouth Two Times A Day 7)  Crestor 10 Mg Tabs (Rosuvastatin Calcium) .... Take 1 Tab By Mouth At Bedtime 8)  Lamictal 100 Mg Tabs (Lamotrigine) .... One By Mouth Daily 9)  Amlodipine Besy-Benazepril Hcl 5-40 Mg Caps (Amlodipine Besy-Benazepril Hcl) .... Take 1 Tablet By Mouth  Once A Day 10)  Trazodone Hcl 150 Mg Tabs (Trazodone Hcl) .... Take 1 Tab By Mouth At Bedtime 11)  Ativan 1 Mg Tabs (Lorazepam) .... Take 1 Tablet By Mouth Four Times A Day 12)  Flexeril 10 Mg Tabs (Cyclobenzaprine Hcl) .... Take 1 Tablet By Mouth Two Times A Day 13)  Plavix 75 Mg Tabs (Clopidogrel Bisulfate) .... Once Daily 14)  Lomotil 2.5-0.025 Mg Tabs (Diphenoxylate-Atropine) .[name].. 1 By Mouth Two Times A Day As Needed For Diarrhea 15)  Glipizide 10 Mg Tabs (Glipizide) .... Take 1 Tablet By Mouth Two Times A Day 16)  Onglyza 5 Mg Tabs (Saxagliptin Hcl) .... Take 1 Tablet By Mouth Once A Day 17)  Aciphex 20 Mg Tbec (Rabeprazole Sodium) .... Once Daily 18)  Nystop 100000 Unit/gm Powd (Nystatin) .... Two Times A Day 19)  Furosemide 20 Mg Tabs (Furosemide) .... Once Daily As Needed 20)  Klor-Con M20 20 Meq Cr-Tabs (Potassium Chloride Crys Cr) .... As Needed With Furosemide 21)  Vitamin D 50000u .... Q Week 22)  Voltaren 1 % Gel (Diclofenac Sodium) .... Apply Twice Daily To Affected Area As Neede 23)  Hydrocodone-Acetaminophen 5-500 Mg Tabs (Hydrocodone-Acetaminophen) .... One Tab By Mouth Two Times A Day 24)  First-Dukes Mouthwash  Susp (Diphenhyd-Hydrocort-Nystatin) .... 2 Teaspoons Three Times Daily As Needed For Horseness 25)  Clonidine Hcl 0.3 Mg Tabs (Clonidine Hcl) .Marland KitchenMarland KitchenMarland Kitchen  One Tablet At 8am, One At 4pm and One At  26)  Thiothixene 2 Mg Caps (Thiothixene) .... Take 1 Tab By Mouth At Bedtime 27)  Metformin Hcl 500 Mg Xr24h-Tab (Metformin Hcl) .... One Twice Daily At Breakfast and Supper 28)  Hydrochlorothiazide 12.5 Mg Tabs (Hydrochlorothiazide) .... Take 1 Tablet By Mouth Once A Day 29)  Reglan 5 Mg Tabs (Metoclopramide Hcl) .... Take 2 Tabs Before Breakfast, 1 Tab Before Lunch, 2 Tabs Before Dinner, 1 Tab At Bedtime  Allergies (verified): 1)  ! * Keflex 2)  ! Pcn 3)  ! Pyridium 4)  * Milk Products  Review of Systems      See HPI General:  Complains of fatigue and sleep  disorder; uses cpap. Eyes:  Denies discharge, eye pain, and red eye. MS:  Complains of joint pain, low back pain, muscle aches, muscle weakness, and stiffness. Neuro:  Complains of difficulty with concentration, disturbances in coordination, falling down, poor balance, tingling, and weakness. Psych:  Complains of anxiety, depression, and mental problems; denies sense of great danger, suicidal thoughts/plans, thoughts of violence, and unusual visions or sounds. Endo:  Denies excessive thirst and excessive urination; fasting blood sugars range 140 to 200. Heme:  Denies abnormal bruising and bleeding. Allergy:  Complains of seasonal allergies; denies hives or rash and itching eyes.  Physical Exam  General:  Well-developed,obese,in no acute distress; alert,appropriate and cooperative throughout examination HEENT: No facial asymmetry,  EOMI, No sinus tenderness, TM's Clear, oropharynx  pink and moist.   Chest: Clear to auscultation bilaterally.  CVS: S1, S2, No murmurs, No S3.   Abd: Soft, Nontender.  MS: decreased  ROM spine, hips, Ext: No edema.  CNS: CN 2-12 intact, reduced power tone and sensationin lower ext  Skin: Intact, no visible lesions or rashes.  Psych: Good eye contact, normal affect.  Memory loss,not anxious or  depressed appearing.    Impression & Recommendations:  Problem # 1:  DYSPHAGIA (KGM-010.27) Assessment Deteriorated will prscribe liquid metformin  Problem # 2:  HYPERLIPIDEMIA (ICD-272.4) Assessment: Improved  Her updated medication list for this problem includes:    Crestor 10 Mg Tabs (Rosuvastatin calcium) .[name]... Take 1 tab by mouth at bedtime  Labs Reviewed: SGOT: 24 (03/29/2010)   SGPT: 17 (03/29/2010)   HDL:47 (05/03/2010), 43 (09/14/2009)  LDL:68 (05/03/2010), 73 (09/14/2009)  Chol:155 (05/03/2010), 158 (09/14/2009)  Trig:200 (05/03/2010), 211 (09/14/2009)  Problem # 3:  HYPERTENSION (ICD-401.9) Assessment: Improved  Her updated medication list for  this problem includes:    Metoprolol Tartrate 50 Mg Tabs (Metoprolol tartrate) .[name]... Take 1 tablet by mouth two times a day    Amlodipine Besy-benazepril Hcl 5-40 Mg Caps (Amlodipine besy-benazepril hcl) .[name]... Take 1 tablet by mouth once a day    Furosemide 20 Mg Tabs (Furosemide) ..... Once daily as needed    Clonidine Hcl 0.3 Mg Tabs (Clonidine hcl) ..... One tablet at 8am, one at 4pm and one at     Hydrochlorothiazide 12.5 Mg Tabs (Hydrochlorothiazide) .[name]... Take 1 tablet by mouth once a day  Orders: T-Basic Metabolic Panel [phone])  BP today: 132/70 Prior BP: 164/88 (04/19/2010)  Labs Reviewed: K+: 4.3 (03/29/2010) Creat: : 0.71 (03/29/2010)   Chol: 155 (05/03/2010)   HDL: 47 (05/03/2010)   LDL: 68 (05/03/2010)   TG: 200 (05/03/2010)  Problem # 4:  BACK PAIN WITH RADICULOPATHY (ICD-729.2) Assessment: Deteriorated toradol administered  Problem # 5:  DIABETES MELLITUS (ICD-250.00) Assessment: Deteriorated  The following medications were removed from the  medication list:    Metformin Hcl 500 Mg Xr24h-tab (Metformin hcl) ..... One twice daily at breakfast and supper Her updated medication list for this problem includes:    Amlodipine Besy-benazepril Hcl 5-40 Mg Caps (Amlodipine besy-benazepril hcl) .[name]... Take 1 tablet by mouth once a day    Glipizide 10 Mg Tabs (Glipizide) .[name]... Take 1 tablet by mouth two times a day    Onglyza 5 Mg Tabs (Saxagliptin hcl) .[name]... Take 1 tablet by mouth once a day    Riomet 500 Mg/61ml Soln (Metformin hcl) .[name]... 2 teaspoons twice daily, dose increase effective 05/06/2010. stop the tablets, pt cannot swallow  Orders: T- Hemoglobin A1C ([id number]) T-Urine Microalbumin w/creat. ratio [phone]) Medicare Electronic Prescription [phone])  Labs Reviewed: Creat: 0.71 (03/29/2010)    Reviewed HgBA1c results: 7.8 (03/29/2010)  7.2 (12/21/2009)  Complete Medication List: 1)  Singulair 10 Mg Tabs (Montelukast sodium) .... One tab  by mouth once daily 2)  Zoloft 50 Mg Tabs (Sertraline hcl) .... 3 tabs once daily 3)  Evoxac 30 Mg Caps (Cevimeline hcl) .... One cap by mouth tid 4)  Mirtazapine 30 Mg Tabs (mirtazapine)  .... Take 1 tab by mouth at bedtime 5)  Levoxyl 50 Mcg Tabs (Levothyroxine sodium) .... Take 1 tablet by mouth once a day 6)  Metoprolol Tartrate 50 Mg Tabs (Metoprolol tartrate) .... Take 1 tablet by mouth two times a day 7)  Crestor 10 Mg Tabs (Rosuvastatin calcium) .... Take 1 tab by mouth at bedtime 8)  Lamictal 100 Mg Tabs (Lamotrigine) .... One by mouth daily 9)  Amlodipine Besy-benazepril Hcl 5-40 Mg Caps (Amlodipine besy-benazepril hcl) .... Take 1 tablet by mouth once a day 10)  Trazodone Hcl 150 Mg Tabs (Trazodone hcl) .... Take 1 tab by mouth at bedtime 11)  Ativan 1 Mg Tabs (Lorazepam) .... Take 1 tablet by mouth four times a day 12)  Flexeril 10 Mg Tabs (Cyclobenzaprine hcl) .... Take 1 tablet by mouth two times a day 13)  Plavix 75 Mg Tabs (Clopidogrel bisulfate) .... Once daily 14)  Lomotil 2.5-0.025 Mg Tabs (Diphenoxylate-atropine) .[name].. 1 by mouth two times a day as needed for diarrhea 15)  Glipizide 10 Mg Tabs (Glipizide) .... Take 1 tablet by mouth two times a day 16)  Onglyza 5 Mg Tabs (Saxagliptin hcl) .... Take 1 tablet by mouth once a day 17)  Aciphex 20 Mg Tbec (Rabeprazole sodium) .... Once daily 18)  Nystop 100000 Unit/gm Powd (Nystatin) .... Two times a day 19)  Furosemide 20 Mg Tabs (Furosemide) .... Once daily as needed 20)  Klor-con M20 20 Meq Cr-tabs (Potassium chloride crys cr) .... As needed with furosemide 21)  Vitamin D 50000u  .... Q week 22)  Voltaren 1 % Gel (Diclofenac sodium) .... Apply twice daily to affected area as neede 23)  Hydrocodone-acetaminophen 5-500 Mg Tabs (Hydrocodone-acetaminophen) .... One tab by mouth two times a day 24)  First-dukes Mouthwash Susp (Diphenhyd-hydrocort-nystatin) .... 2 teaspoons three times daily as needed for horseness 25)  Clonidine  Hcl 0.3 Mg Tabs (Clonidine hcl) .... One tablet at 8am, one at 4pm and one at  26)  Thiothixene 2 Mg Caps (Thiothixene) .... Take 1 tab by mouth at bedtime 27)  Hydrochlorothiazide 12.5 Mg Tabs (Hydrochlorothiazide) .... Take 1 tablet by mouth once a day 28)  Reglan 5 Mg Tabs (Metoclopramide hcl) .... Take 2 tabs before breakfast, 1 tab before lunch, 2 tabs before dinner, 1 tab at bedtime 29)  Riomet 500 Mg/67ml Soln (Metformin  hcl) .... 2 teaspoons twice daily, dose increase effective 05/06/2010. stop the tablets, pt cannot swallow  Other Orders: Ketorolac-Toradol 15mg  ([id number]) Admin of Therapeutic Inj  intramuscular or subcutaneous ([id number])  Patient Instructions: 1)  F/U end April. 2)  HbgA1C prior to visit, ICD-9:  end April. 3)  Dose increase and med change with the metformin, to a liquid form 4)  BMP prior to visit, ICD-9: 5)  pls follow a low carb diet. 6)  Your blood pressure is good. 7)  Microalb to be sent today Prescriptions: RIOMET 500 MG/5ML SOLN (METFORMIN HCL) 2 teaspoons twice daily, dose increase effective 05/06/2010. Stop the tablets, pt cannot swallow  #600cc x 3   Entered and Authorized by:   Syliva Overman MD   Signed by:   Syliva Overman MD on 05/06/2010   Method used:   Printed then faxed to ...       [facility] (retail)       [address]       [place], [place]  [address]       Ph: [phone]       Fax: [phone]   RxID:   [id number]    Medication Administration  Injection # 1:    Medication: Ketorolac-Toradol 15mg     Diagnosis: BACK PAIN WITH RADICULOPATHY (ICD-729.2)    Route: IM    Site: LUOQ gluteus    Exp Date: 08/12/2011    Lot #: [id number]    Mfr: [email or web address]    Comments: toradol 60mg  given    Patient tolerated injection without complications    Given by: Adella Hare LPN (May 06, 2010 11:34 AM)  Orders Added: 1)  Est. Patient Level IV [44034] 2)  T-Basic Metabolic Panel  [[id number]] 3)  T- Hemoglobin A1C [83036-23375] 4)  T-Urine Microalbumin w/creat. ratio [82043-[address]] 5)  Ketorolac-Toradol 15mg  [J1885] 6)  Admin of Therapeutic Inj  intramuscular or subcutaneous [96372] 7)  Medicare Electronic Prescription [G8553]     Medication Administration  Injection # 1:    Medication: Ketorolac-Toradol 15mg     Diagnosis: BACK PAIN WITH RADICULOPATHY (ICD-729.2)    Route: IM    Site: LUOQ gluteus    Exp Date: 08/12/2011    Lot #: [id number]    Mfr: [email or web address]    Comments: toradol 60mg  given    Patient tolerated injection without complications    Given by: Adella Hare LPN (May 06, 2010 11:34 AM)  Orders Added: 1)  Est. Patient Level IV [38756] 2)  T-Basic Metabolic Panel [80048-22910] 3)  T- Hemoglobin A1C [83036-23375] 4)  T-Urine Microalbumin w/creat. ratio [82043-[address]] 5)  Ketorolac-Toradol 15mg  [J1885] 6)  Admin of Therapeutic Inj  intramuscular or subcutaneous [96372] 7)  Medicare Electronic Prescription [phone]

## 2010-05-19 NOTE — Progress Notes (Signed)
Summary: [facility]  Phone Note Call from Patient   Summary of Call: [facility] left message to call back at [phone] case # [id number] Initial call taken by: Lind Guest,  May 11, 2010 7:59 AM  Follow-up for Phone Call        physician already has faxed form from [facility] Follow-up by: Adella Hare LPN,  May 11, 2010 4:01 PM

## 2010-05-20 ENCOUNTER — Encounter: Payer: Self-pay | Admitting: Gastroenterology

## 2010-05-20 ENCOUNTER — Encounter: Payer: Self-pay | Admitting: Family Medicine

## 2010-05-23 ENCOUNTER — Encounter (INDEPENDENT_AMBULATORY_CARE_PROVIDER_SITE_OTHER): Payer: Self-pay

## 2010-05-23 LAB — GLUCOSE, CAPILLARY

## 2010-05-24 ENCOUNTER — Ambulatory Visit (HOSPITAL_COMMUNITY)
Admission: RE | Admit: 2010-05-24 | Discharge: 2010-05-24 | Disposition: A | Discharge status: Home / Self Care | Payer: Medicare Other | Source: Ambulatory Visit | Attending: *Deleted | Admitting: *Deleted

## 2010-05-24 LAB — BASIC METABOLIC PANEL
Chloride: 101 mEq/L (ref 96–112)
Creatinine, Ser: 0.74 mg/dL (ref 0.4–1.2)
GFR calc Af Amer: >60 mL/min (ref >60)
GFR calc non Af Amer: >60 mL/min (ref >60)
Potassium: 4.1 mEq/L (ref 3.5–5.1)

## 2010-05-24 LAB — HEMOGLOBIN AND HEMATOCRIT, BLOOD
HCT: 34 % — ABNORMAL LOW (ref 36.0–46.0)
Hemoglobin: 10.4 g/dL — ABNORMAL LOW (ref 12.0–15.0)

## 2010-05-24 NOTE — Progress Notes (Signed)
Summary: ER And vertigo  Phone Note Call from Patient   Summary of Call: pt went to er yesterday for dizziness and falling and hitting wall.  Er doc wanted dr. Lodema Hong to put her on something for vertigo.  [phone]  Initial call taken by: Rudene Anda,  May 17, 2010 3:28 PM  Follow-up for Phone Call        please notify the patient and the pharmacy of the medication change. The new script is entered historically, please send after speaking with the patient.  Follow-up by: Syliva Overman MD,  May 17, 2010 4:39 PM  Additional Follow-up for Phone Call Additional follow up Details #1::        Phone Call Completed, Rx Called In Additional Follow-up by: Adella Hare LPN,  May 17, 2010 4:53 PM    New/Updated Medications: MECLIZINE HCL 12.5 MG TABS (MECLIZINE HCL) Take 1 tablet by mouth three times a day as needed for vertigo Prescriptions: MECLIZINE HCL 12.5 MG TABS (MECLIZINE HCL) Take 1 tablet by mouth three times a day as needed for vertigo  #15 x 0   Entered by:   Adella Hare LPN   Authorized by:   Syliva Overman MD   Signed by:   Adella Hare LPN on 45/40/9811   Method used:   Electronically to        [facility] (retail)       [address]       [place], [place]  [address]       Ph: [phone]       Fax: [phone]   RxID:   [id number] MECLIZINE HCL 12.5 MG TABS (MECLIZINE HCL) Take 1 tablet by mouth three times a day as needed for vertigo  #15 x 0   Entered and Authorized by:   Syliva Overman MD   Signed by:   Syliva Overman MD on 05/17/2010   Method used:   Historical   RxID:   [id number]

## 2010-05-24 NOTE — Letter (Signed)
Summary: ent  ent   Imported By: Lind Guest 05/16/2010 13:11:17  _____________________________________________________________________  External Attachment:    Type:   Image     Comment:   External Document

## 2010-05-25 ENCOUNTER — Ambulatory Visit (HOSPITAL_COMMUNITY)
Admission: RE | Admit: 2010-05-25 | Discharge: 2010-05-25 | Disposition: A | Discharge status: Home / Self Care | Payer: Medicare Other | Source: Ambulatory Visit | Attending: Family Medicine | Admitting: Family Medicine

## 2010-05-25 LAB — COMPREHENSIVE METABOLIC PANEL
BUN: 11 mg/dL (ref 6–23)
CO2: 24 mEq/L (ref 19–32)
Calcium: 9.1 mg/dL (ref 8.4–10.5)
Creatinine, Ser: 0.67 mg/dL (ref 0.4–1.2)
GFR calc non Af Amer: >60 mL/min (ref >60)
Glucose, Bld: 158 mg/dL — ABNORMAL HIGH (ref 70–99)
Sodium: 134 mEq/L — ABNORMAL LOW (ref 135–145)
Total Protein: 7.7 g/dL (ref 6.0–8.3)

## 2010-05-25 LAB — URINALYSIS, ROUTINE W REFLEX MICROSCOPIC
Bilirubin Urine: NEGATIVE
Glucose, UA: NEGATIVE mg/dL
Ketones, ur: NEGATIVE mg/dL
Protein, ur: NEGATIVE mg/dL

## 2010-05-25 LAB — CBC
HCT: 34.8 % — ABNORMAL LOW (ref 36.0–46.0)
Hemoglobin: 11.1 g/dL — ABNORMAL LOW (ref 12.0–15.0)
MCH: 22.8 pg — ABNORMAL LOW (ref 26.0–34.0)
MCHC: 31.8 g/dL (ref 30.0–36.0)
MCV: 71.5 fL — ABNORMAL LOW (ref 78.0–100.0)
RDW: 15 % (ref 11.5–15.5)

## 2010-05-25 LAB — DIFFERENTIAL
Eosinophils Absolute: 0.1 10*3/uL (ref 0.0–0.7)
Lymphocytes Relative: 29 % (ref 12–46)
Lymphs Abs: 1.9 10*3/uL (ref 0.7–4.0)
Monocytes Relative: 5 % (ref 3–12)
Neutro Abs: 4.3 10*3/uL (ref 1.7–7.7)
Neutrophils Relative %: 65 % (ref 43–77)

## 2010-05-25 LAB — PROTIME-INR
INR: 0.97 (ref 0.00–1.49)
Prothrombin Time: 13.1 seconds (ref 11.6–15.2)

## 2010-05-25 LAB — APTT: aPTT: 26 seconds (ref 24–37)

## 2010-05-26 LAB — URINALYSIS, ROUTINE W REFLEX MICROSCOPIC
Bilirubin Urine: NEGATIVE
Hgb urine dipstick: NEGATIVE
Nitrite: NEGATIVE
Protein, ur: NEGATIVE mg/dL
Specific Gravity, Urine: 1.02 (ref 1.005–1.030)
Urobilinogen, UA: 0.2 mg/dL (ref 0.0–1.0)

## 2010-05-27 ENCOUNTER — Ambulatory Visit (HOSPITAL_COMMUNITY)
Admission: RE | Admit: 2010-05-27 | Discharge: 2010-05-27 | Disposition: A | Discharge status: Home / Self Care | Payer: Medicare Other | Source: Ambulatory Visit | Attending: Family Medicine | Admitting: Family Medicine

## 2010-05-27 LAB — GLUCOSE, CAPILLARY: Glucose-Capillary: 169 mg/dL — ABNORMAL HIGH (ref 70–99)

## 2010-05-29 LAB — CBC
MCH: 22.8 pg — ABNORMAL LOW (ref 26.0–34.0)
MCV: 72.5 fL — ABNORMAL LOW (ref 78.0–100.0)
Platelets: 204 10*3/uL (ref 150–400)
RDW: 14.5 % (ref 11.5–15.5)

## 2010-05-29 LAB — DIFFERENTIAL
Basophils Absolute: 0 10*3/uL (ref 0.0–0.1)
Basophils Relative: 0 % (ref 0–1)
Eosinophils Absolute: 0.1 10*3/uL (ref 0.0–0.7)
Eosinophils Relative: 2 % (ref 0–5)
Neutrophils Relative %: 58 % (ref 43–77)

## 2010-05-29 LAB — BASIC METABOLIC PANEL
BUN: 20 mg/dL (ref 6–23)
CO2: 25 mEq/L (ref 19–32)
Calcium: 8.7 mg/dL (ref 8.4–10.5)
Chloride: 108 mEq/L (ref 96–112)
Creatinine, Ser: 0.86 mg/dL (ref 0.4–1.2)
GFR calc Af Amer: >60 mL/min (ref >60)

## 2010-05-29 LAB — POCT CARDIAC MARKERS
CKMB, poc: <1 ng/mL — ABNORMAL LOW (ref 1.0–8.0)
Myoglobin, poc: 95.3 ng/mL (ref 12–200)
Troponin i, poc: <0.05 ng/mL (ref 0.00–0.09)

## 2010-05-30 ENCOUNTER — Telehealth (INDEPENDENT_AMBULATORY_CARE_PROVIDER_SITE_OTHER): Payer: Self-pay | Admitting: *Deleted

## 2010-05-31 ENCOUNTER — Ambulatory Visit (HOSPITAL_COMMUNITY): Payer: Medicare Other

## 2010-05-31 LAB — DIFFERENTIAL
Basophils Absolute: 0 10*3/uL (ref 0.0–0.1)
Basophils Relative: 0 % (ref 0–1)
Eosinophils Absolute: 0.1 10*3/uL (ref 0.0–0.7)
Eosinophils Relative: 3 % (ref 0–5)
Lymphocytes Relative: 28 % (ref 12–46)
Monocytes Absolute: 0.5 10*3/uL (ref 0.1–1.0)
Monocytes Absolute: 0.5 10*3/uL (ref 0.1–1.0)
Monocytes Relative: 7 % (ref 3–12)
Neutro Abs: 4.7 10*3/uL (ref 1.7–7.7)

## 2010-05-31 LAB — URINE CULTURE

## 2010-05-31 LAB — URINE MICROSCOPIC-ADD ON

## 2010-05-31 LAB — KETONES, QUALITATIVE: Acetone, Bld: NEGATIVE

## 2010-05-31 LAB — COMPREHENSIVE METABOLIC PANEL
ALT: 31 U/L (ref 0–35)
AST: 24 U/L (ref 0–37)
Albumin: 3.4 g/dL — ABNORMAL LOW (ref 3.5–5.2)
Chloride: 105 mEq/L (ref 96–112)
Creatinine, Ser: 0.87 mg/dL (ref 0.4–1.2)
GFR calc Af Amer: >60 mL/min (ref >60)
Potassium: 3.9 mEq/L (ref 3.5–5.1)
Sodium: 136 mEq/L (ref 135–145)
Total Bilirubin: 0.2 mg/dL — ABNORMAL LOW (ref 0.3–1.2)

## 2010-05-31 LAB — BASIC METABOLIC PANEL
CO2: 25 mEq/L (ref 19–32)
Chloride: 105 mEq/L (ref 96–112)
GFR calc Af Amer: >60 mL/min (ref >60)
Potassium: 3.2 mEq/L — ABNORMAL LOW (ref 3.5–5.1)
Sodium: 137 mEq/L (ref 135–145)

## 2010-05-31 LAB — CBC
Hemoglobin: 9.9 g/dL — ABNORMAL LOW (ref 12.0–15.0)
MCHC: 32.3 g/dL (ref 30.0–36.0)
MCV: 71.8 fL — ABNORMAL LOW (ref 78.0–100.0)
MCV: 72 fL — ABNORMAL LOW (ref 78.0–100.0)
Platelets: 216 10*3/uL (ref 150–400)
RBC: 4.23 MIL/uL (ref 3.87–5.11)
WBC: 6.4 10*3/uL (ref 4.0–10.5)

## 2010-05-31 LAB — URINALYSIS, ROUTINE W REFLEX MICROSCOPIC
Glucose, UA: >1000 mg/dL — AB
Hgb urine dipstick: NEGATIVE
Specific Gravity, Urine: 1.025 (ref 1.005–1.030)

## 2010-05-31 LAB — GLUCOSE, CAPILLARY: Glucose-Capillary: 407 mg/dL — ABNORMAL HIGH (ref 70–99)

## 2010-05-31 NOTE — Medication Information (Signed)
Summary: METOCLOPRAMIDE 5MG   METOCLOPRAMIDE 5MG    Imported By: Rexene Alberts 05/20/2010 16:27:41  _____________________________________________________________________  External Attachment:    Type:   Image     Comment:   External Document  Appended Document: METOCLOPRAMIDE 5MG  can we have update on pt's nausea with the reglan medication adjustment? would like to use lowest dose possible  Appended Document: METOCLOPRAMIDE 5MG  pt stated she is still having alot of nausea  Appended Document: METOCLOPRAMIDE 5MG     Prescriptions: REGLAN 5 MG TABS (METOCLOPRAMIDE HCL) take 2 tabs before breakfast, 1 tab before lunch, 2 tabs before dinner, 1 tab at bedtime  #120 x 1   Entered and Authorized by:   Gerrit Halls NP   Signed by:   Gerrit Halls NP on 05/26/2010   Method used:   Faxed to ...       [facility] (retail)       [address]       [place], [place]  [address]       Ph: [phone]       Fax: [phone]   RxID:   [id number]

## 2010-05-31 NOTE — Letter (Signed)
Summary: Normal Results Letter  [facility]  [address]   [place], [place] [address]   Phone: [phone]  Fax: [phone]    May 23, 2010  [name] [address] [place] [address] [place], [place]  [address]  [place] Jul 14, 1950   Dear [name],     We just wanted to inform you that  your stool test was normal.  If you have any questions, please call the office at [phone].   Thank you,    Hendricks Limes, LPN Cloria Spring, LPN  [facility] Ph: [phone]   Fax: [phone]

## 2010-06-01 ENCOUNTER — Ambulatory Visit (HOSPITAL_COMMUNITY): Payer: Medicare Other | Admitting: *Deleted

## 2010-06-01 LAB — POCT I-STAT, CHEM 8
Creatinine, Ser: 0.6 mg/dL (ref 0.4–1.2)
Glucose, Bld: 255 mg/dL — ABNORMAL HIGH (ref 70–99)
Hemoglobin: 12.2 g/dL (ref 12.0–15.0)
Potassium: 3.6 mEq/L (ref 3.5–5.1)

## 2010-06-05 LAB — BASIC METABOLIC PANEL
CO2: 23 mEq/L (ref 19–32)
Calcium: 8.2 mg/dL — ABNORMAL LOW (ref 8.4–10.5)
Chloride: 112 mEq/L (ref 96–112)
GFR calc Af Amer: >60 mL/min (ref >60)
Sodium: 138 mEq/L (ref 135–145)

## 2010-06-05 LAB — HEMOGLOBIN A1C: Mean Plasma Glucose: 169 mg/dL

## 2010-06-05 LAB — URINALYSIS, ROUTINE W REFLEX MICROSCOPIC
Glucose, UA: NEGATIVE mg/dL
Hgb urine dipstick: NEGATIVE
Specific Gravity, Urine: >1.03 — ABNORMAL HIGH (ref 1.005–1.030)
Urobilinogen, UA: 0.2 mg/dL (ref 0.0–1.0)

## 2010-06-05 LAB — DIFFERENTIAL
Basophils Absolute: 0.1 10*3/uL (ref 0.0–0.1)
Basophils Relative: 0 % (ref 0–1)
Eosinophils Absolute: 0.2 10*3/uL (ref 0.0–0.7)
Lymphocytes Relative: 32 % (ref 12–46)
Lymphs Abs: 2.2 10*3/uL (ref 0.7–4.0)
Monocytes Absolute: 0.4 10*3/uL (ref 0.1–1.0)
Monocytes Relative: 8 % (ref 3–12)
Neutro Abs: 2.3 10*3/uL (ref 1.7–7.7)
Neutrophils Relative %: 45 % (ref 43–77)
Neutrophils Relative %: 55 % (ref 43–77)

## 2010-06-05 LAB — ETHANOL: Alcohol, Ethyl (B): <5 mg/dL (ref 0–10)

## 2010-06-05 LAB — CBC
HCT: 30.8 % — ABNORMAL LOW (ref 36.0–46.0)
Hemoglobin: 9.1 g/dL — ABNORMAL LOW (ref 12.0–15.0)
Hemoglobin: 9.8 g/dL — ABNORMAL LOW (ref 12.0–15.0)
MCHC: 32.1 g/dL (ref 30.0–36.0)
RBC: 3.94 MIL/uL (ref 3.87–5.11)
RDW: 15.5 % (ref 11.5–15.5)
WBC: 5.1 10*3/uL (ref 4.0–10.5)
WBC: 7.3 10*3/uL (ref 4.0–10.5)

## 2010-06-05 LAB — COMPREHENSIVE METABOLIC PANEL
Alkaline Phosphatase: 107 U/L (ref 39–117)
BUN: 40 mg/dL — ABNORMAL HIGH (ref 6–23)
CO2: 22 mEq/L (ref 19–32)
Chloride: 106 mEq/L (ref 96–112)
GFR calc non Af Amer: 47 mL/min — ABNORMAL LOW (ref >60)
Glucose, Bld: 116 mg/dL — ABNORMAL HIGH (ref 70–99)
Potassium: 4.9 mEq/L (ref 3.5–5.1)
Total Bilirubin: 0.3 mg/dL (ref 0.3–1.2)

## 2010-06-05 LAB — RAPID URINE DRUG SCREEN, HOSP PERFORMED: Barbiturates: NOT DETECTED

## 2010-06-05 LAB — GLUCOSE, CAPILLARY: Glucose-Capillary: 132 mg/dL — ABNORMAL HIGH (ref 70–99)

## 2010-06-05 LAB — TSH: TSH: 0.295 u[IU]/mL — ABNORMAL LOW (ref 0.350–4.500)

## 2010-06-06 ENCOUNTER — Ambulatory Visit (HOSPITAL_COMMUNITY): Payer: Medicare Other | Admitting: Physical Therapy

## 2010-06-08 ENCOUNTER — Ambulatory Visit (HOSPITAL_COMMUNITY): Payer: Medicare Other

## 2010-06-09 NOTE — Progress Notes (Signed)
  Phone Note Call from Patient   Caller: Patient Summary of Call: Pt is having abd cramps and nausea. She called her PCP and they told her to call us. She would like something called in for the nausea and would like to know what can she do about the abd cramps. Her pharmacy is [facility]. She can be reached at [phone].  Initial call taken by: Peggyann Shoals,  May 30, 2010 11:15 AM     Appended Document:     Prescriptions: ZOFRAN 4 MG TABS (ONDANSETRON HCL) take 1 by mouth every 8 hours as needed for nausea  #30 x 2   Entered and Authorized by:   Gerrit Halls NP   Signed by:   Gerrit Halls NP on 05/30/2010   Method used:   Faxed to ...       [facility] (retail)       [address]       [place], [place]  [address]       Ph: [phone]       Fax: [phone]   RxID:   [id number]

## 2010-06-10 ENCOUNTER — Inpatient Hospital Stay (HOSPITAL_COMMUNITY)
Admission: RE | Admit: 2010-06-10 | Discharge: 2010-06-10 | Disposition: A | Discharge status: Home / Self Care | Payer: Medicare Other | Source: Ambulatory Visit

## 2010-06-13 ENCOUNTER — Telehealth: Payer: Self-pay | Admitting: Family Medicine

## 2010-06-14 NOTE — Telephone Encounter (Signed)
She should qualify for help at that time , would not be able to stay overnight and have this covered though so she may want to consider short term skilled care till she feels safe on her own. Tell her all the best with the surgery and happy birhday when it comes

## 2010-06-15 ENCOUNTER — Ambulatory Visit (HOSPITAL_COMMUNITY): Payer: Medicare Other | Admitting: Physical Therapy

## 2010-06-15 ENCOUNTER — Other Ambulatory Visit: Payer: Self-pay | Admitting: Family Medicine

## 2010-06-15 ENCOUNTER — Encounter: Payer: Self-pay | Admitting: Family Medicine

## 2010-06-15 LAB — BASIC METABOLIC PANEL
Chloride: 101 mEq/L (ref 96–112)
Potassium: 4.3 mEq/L (ref 3.5–5.3)
Sodium: 139 mEq/L (ref 135–145)

## 2010-06-15 LAB — HEMOGLOBIN A1C
Hgb A1c MFr Bld: 7.8 % — ABNORMAL HIGH (ref <5.7)
Mean Plasma Glucose: 177 mg/dL — ABNORMAL HIGH (ref <117)

## 2010-06-16 ENCOUNTER — Ambulatory Visit (HOSPITAL_COMMUNITY)
Admission: RE | Admit: 2010-06-16 | Discharge: 2010-06-16 | Disposition: A | Discharge status: Home / Self Care | Payer: Medicare Other | Source: Ambulatory Visit | Attending: Family Medicine | Admitting: Family Medicine

## 2010-06-16 DIAGNOSIS — IMO0001 Reserved for inherently not codable concepts without codable children: Secondary | ICD-10-CM | POA: Insufficient documentation

## 2010-06-16 DIAGNOSIS — M545 Low back pain, unspecified: Secondary | ICD-10-CM | POA: Insufficient documentation

## 2010-06-16 DIAGNOSIS — I10 Essential (primary) hypertension: Secondary | ICD-10-CM | POA: Insufficient documentation

## 2010-06-16 DIAGNOSIS — M6281 Muscle weakness (generalized): Secondary | ICD-10-CM | POA: Insufficient documentation

## 2010-06-16 DIAGNOSIS — R262 Difficulty in walking, not elsewhere classified: Secondary | ICD-10-CM | POA: Insufficient documentation

## 2010-06-16 DIAGNOSIS — E119 Type 2 diabetes mellitus without complications: Secondary | ICD-10-CM | POA: Insufficient documentation

## 2010-06-17 ENCOUNTER — Ambulatory Visit (HOSPITAL_COMMUNITY): Payer: Medicare Other

## 2010-06-19 LAB — DIFFERENTIAL
Basophils Absolute: 0 10*3/uL (ref 0.0–0.1)
Basophils Relative: 0 % (ref 0–1)
Eosinophils Absolute: 0.1 10*3/uL (ref 0.0–0.7)
Eosinophils Absolute: 0.1 10*3/uL (ref 0.0–0.7)
Eosinophils Relative: 1 % (ref 0–5)
Eosinophils Relative: 3 % (ref 0–5)
Lymphocytes Relative: 18 % (ref 12–46)
Lymphocytes Relative: 27 % (ref 12–46)
Lymphocytes Relative: 27 % (ref 12–46)
Lymphs Abs: 1.5 10*3/uL (ref 0.7–4.0)
Lymphs Abs: 1.5 10*3/uL (ref 0.7–4.0)
Lymphs Abs: 2.3 10*3/uL (ref 0.7–4.0)
Monocytes Absolute: 0.5 10*3/uL (ref 0.1–1.0)
Monocytes Absolute: 0.6 10*3/uL (ref 0.1–1.0)
Monocytes Absolute: 0.7 10*3/uL (ref 0.1–1.0)
Monocytes Relative: 5 % (ref 3–12)
Monocytes Relative: 9 % (ref 3–12)
Neutro Abs: 3.3 10*3/uL (ref 1.7–7.7)
Neutro Abs: 5.3 10*3/uL (ref 1.7–7.7)
Neutro Abs: 6.9 10*3/uL (ref 1.7–7.7)
Neutrophils Relative %: 61 % (ref 43–77)
Neutrophils Relative %: 78 % — ABNORMAL HIGH (ref 43–77)

## 2010-06-19 LAB — BASIC METABOLIC PANEL
BUN: 30 mg/dL — ABNORMAL HIGH (ref 6–23)
CO2: 24 mEq/L (ref 19–32)
Calcium: 9.1 mg/dL (ref 8.4–10.5)
GFR calc non Af Amer: 30 mL/min — ABNORMAL LOW (ref >60)
GFR calc non Af Amer: 50 mL/min — ABNORMAL LOW (ref >60)
Glucose, Bld: 106 mg/dL — ABNORMAL HIGH (ref 70–99)
Glucose, Bld: 109 mg/dL — ABNORMAL HIGH (ref 70–99)
Potassium: 3 mEq/L — ABNORMAL LOW (ref 3.5–5.1)
Potassium: 3.7 mEq/L (ref 3.5–5.1)
Sodium: 139 mEq/L (ref 135–145)

## 2010-06-19 LAB — GLUCOSE, CAPILLARY
Glucose-Capillary: 105 mg/dL — ABNORMAL HIGH (ref 70–99)
Glucose-Capillary: 112 mg/dL — ABNORMAL HIGH (ref 70–99)
Glucose-Capillary: 120 mg/dL — ABNORMAL HIGH (ref 70–99)
Glucose-Capillary: 123 mg/dL — ABNORMAL HIGH (ref 70–99)
Glucose-Capillary: 128 mg/dL — ABNORMAL HIGH (ref 70–99)
Glucose-Capillary: 144 mg/dL — ABNORMAL HIGH (ref 70–99)
Glucose-Capillary: 95 mg/dL (ref 70–99)

## 2010-06-19 LAB — CBC
HCT: 22.4 % — ABNORMAL LOW (ref 36.0–46.0)
HCT: 25.3 % — ABNORMAL LOW (ref 36.0–46.0)
HCT: 25.7 % — ABNORMAL LOW (ref 36.0–46.0)
Hemoglobin: 7.2 g/dL — CL (ref 12.0–15.0)
Hemoglobin: 8.2 g/dL — ABNORMAL LOW (ref 12.0–15.0)
Hemoglobin: 8.3 g/dL — ABNORMAL LOW (ref 12.0–15.0)
Hemoglobin: 9.5 g/dL — ABNORMAL LOW (ref 12.0–15.0)
MCHC: 32.3 g/dL (ref 30.0–36.0)
MCHC: 32.6 g/dL (ref 30.0–36.0)
MCHC: 32.6 g/dL (ref 30.0–36.0)
MCV: 71.2 fL — ABNORMAL LOW (ref 78.0–100.0)
Platelets: 331 10*3/uL (ref 150–400)
Platelets: 341 10*3/uL (ref 150–400)
Platelets: 382 10*3/uL (ref 150–400)
Platelets: 395 10*3/uL (ref 150–400)
RDW: 14.9 % (ref 11.5–15.5)
RDW: 15 % (ref 11.5–15.5)
RDW: 17 % — ABNORMAL HIGH (ref 11.5–15.5)
RDW: 17.5 % — ABNORMAL HIGH (ref 11.5–15.5)
WBC: 8.7 10*3/uL (ref 4.0–10.5)

## 2010-06-19 LAB — COMPREHENSIVE METABOLIC PANEL
ALT: 9 U/L (ref 0–35)
Albumin: 2.5 g/dL — ABNORMAL LOW (ref 3.5–5.2)
Albumin: 2.8 g/dL — ABNORMAL LOW (ref 3.5–5.2)
BUN: 12 mg/dL (ref 6–23)
Calcium: 9.1 mg/dL (ref 8.4–10.5)
Calcium: 9.5 mg/dL (ref 8.4–10.5)
Glucose, Bld: 118 mg/dL — ABNORMAL HIGH (ref 70–99)
Glucose, Bld: 131 mg/dL — ABNORMAL HIGH (ref 70–99)
Sodium: 141 mEq/L (ref 135–145)
Sodium: 147 mEq/L — ABNORMAL HIGH (ref 135–145)
Total Protein: 6.3 g/dL (ref 6.0–8.3)
Total Protein: 6.9 g/dL (ref 6.0–8.3)

## 2010-06-19 LAB — CROSSMATCH
ABO/RH(D): O POS
Antibody Screen: NEGATIVE

## 2010-06-19 LAB — URINALYSIS, ROUTINE W REFLEX MICROSCOPIC
Bilirubin Urine: NEGATIVE
Nitrite: NEGATIVE
Specific Gravity, Urine: 1.015 (ref 1.005–1.030)
Urobilinogen, UA: 0.2 mg/dL (ref 0.0–1.0)
pH: 5.5 (ref 5.0–8.0)

## 2010-06-19 LAB — IRON AND TIBC
Iron: 19 ug/dL — ABNORMAL LOW (ref 42–135)
Saturation Ratios: 9 % — ABNORMAL LOW (ref 20–55)
TIBC: 226 ug/dL — ABNORMAL LOW (ref 250–470)
UIBC: 206 ug/dL

## 2010-06-19 LAB — LIPID PANEL
Cholesterol: 96 mg/dL (ref 0–200)
HDL: 26 mg/dL — ABNORMAL LOW (ref >39)
LDL Cholesterol: 39 mg/dL (ref 0–99)
Total CHOL/HDL Ratio: 3.7 RATIO
Triglycerides: 153 mg/dL — ABNORMAL HIGH (ref <150)

## 2010-06-19 LAB — TSH
TSH: 0.066 u[IU]/mL — ABNORMAL LOW (ref 0.350–4.500)
TSH: 0.104 u[IU]/mL — ABNORMAL LOW (ref 0.350–4.500)

## 2010-06-19 LAB — ETHANOL: Alcohol, Ethyl (B): <5 mg/dL (ref 0–10)

## 2010-06-19 LAB — RETICULOCYTES: Retic Count, Absolute: 72 10*3/uL (ref 19.0–186.0)

## 2010-06-19 LAB — PROTIME-INR
INR: 1.2 (ref 0.00–1.49)
Prothrombin Time: 15.6 seconds — ABNORMAL HIGH (ref 11.6–15.2)

## 2010-06-19 LAB — RAPID URINE DRUG SCREEN, HOSP PERFORMED
Benzodiazepines: POSITIVE — AB
Cocaine: NOT DETECTED
Opiates: POSITIVE — AB
Tetrahydrocannabinol: NOT DETECTED

## 2010-06-19 LAB — HEMOGLOBIN A1C: Mean Plasma Glucose: 128 mg/dL

## 2010-06-20 ENCOUNTER — Encounter: Payer: Self-pay | Admitting: Family Medicine

## 2010-06-20 ENCOUNTER — Other Ambulatory Visit: Payer: Self-pay | Admitting: Family Medicine

## 2010-06-20 ENCOUNTER — Ambulatory Visit (INDEPENDENT_AMBULATORY_CARE_PROVIDER_SITE_OTHER): Payer: Medicare Other | Admitting: Family Medicine

## 2010-06-20 VITALS — BP 130/78 | HR 80 | Resp 16 | Ht 61.5 in | Wt 167.8 lb

## 2010-06-20 DIAGNOSIS — E785 Hyperlipidemia, unspecified: Secondary | ICD-10-CM

## 2010-06-20 DIAGNOSIS — E119 Type 2 diabetes mellitus without complications: Secondary | ICD-10-CM

## 2010-06-20 DIAGNOSIS — I10 Essential (primary) hypertension: Secondary | ICD-10-CM

## 2010-06-20 DIAGNOSIS — M549 Dorsalgia, unspecified: Secondary | ICD-10-CM

## 2010-06-20 LAB — GLUCOSE, CAPILLARY
Glucose-Capillary: 137 mg/dL — ABNORMAL HIGH (ref 70–99)
Glucose-Capillary: 141 mg/dL — ABNORMAL HIGH (ref 70–99)
Glucose-Capillary: 150 mg/dL — ABNORMAL HIGH (ref 70–99)
Glucose-Capillary: 156 mg/dL — ABNORMAL HIGH (ref 70–99)
Glucose-Capillary: 169 mg/dL — ABNORMAL HIGH (ref 70–99)
Glucose-Capillary: 174 mg/dL — ABNORMAL HIGH (ref 70–99)
Glucose-Capillary: 175 mg/dL — ABNORMAL HIGH (ref 70–99)
Glucose-Capillary: 182 mg/dL — ABNORMAL HIGH (ref 70–99)
Glucose-Capillary: 82 mg/dL (ref 70–99)

## 2010-06-20 LAB — CBC
HCT: 36.5 % (ref 36.0–46.0)
Hemoglobin: 11.3 g/dL — ABNORMAL LOW (ref 12.0–15.0)
MCV: 71.6 fL — ABNORMAL LOW (ref 78.0–100.0)
Platelets: 278 10*3/uL (ref 150–400)
RBC: 5.11 MIL/uL (ref 3.87–5.11)
WBC: 6.6 10*3/uL (ref 4.0–10.5)

## 2010-06-20 LAB — BASIC METABOLIC PANEL
BUN: 23 mg/dL (ref 6–23)
Chloride: 108 mEq/L (ref 96–112)
GFR calc Af Amer: >60 mL/min (ref >60)
GFR calc non Af Amer: >60 mL/min (ref >60)
Potassium: 4.8 mEq/L (ref 3.5–5.1)
Sodium: 139 mEq/L (ref 135–145)

## 2010-06-20 MED ORDER — INSULIN GLARGINE 100 UNIT/ML ~~LOC~~ SOLN: SUBCUTANEOUS | Status: DC

## 2010-06-20 NOTE — Patient Instructions (Addendum)
F/u in 2 months.  Pls cut back on very sweet fruit eg the watermelon and oranges. You need to add long acting insulin (lantus) once at bedtime start at 7 units for 2 weeks, then increase to 10 units  If your fasting sugars stay above 130. Pls test your sugar fasting and last thing at night   I think it best that you go to rehab facility after your back surgery, you will need the extra help. All the best with your surgery  HAPPY birthday!!!

## 2010-06-20 NOTE — Telephone Encounter (Signed)
Patient in for OV, will advise

## 2010-06-21 ENCOUNTER — Other Ambulatory Visit: Payer: Self-pay | Admitting: Family Medicine

## 2010-06-21 LAB — BASIC METABOLIC PANEL
BUN: 10 mg/dL (ref 6–23)
BUN: 10 mg/dL (ref 6–23)
CO2: 24 mEq/L (ref 19–32)
CO2: 29 mEq/L (ref 19–32)
Calcium: 9.3 mg/dL (ref 8.4–10.5)
Calcium: 8.9 mg/dL (ref 8.4–10.5)
Calcium: 9.3 mg/dL (ref 8.4–10.5)
Chloride: 106 mEq/L (ref 96–112)
Chloride: 107 mEq/L (ref 96–112)
Creatinine, Ser: 0.74 mg/dL (ref 0.4–1.2)
GFR calc Af Amer: >60 mL/min (ref >60)
GFR calc non Af Amer: >60 mL/min (ref >60)
Glucose, Bld: 177 mg/dL — ABNORMAL HIGH (ref 70–99)
Glucose, Bld: 139 mg/dL — ABNORMAL HIGH (ref 70–99)
Potassium: 4.4 mEq/L (ref 3.5–5.1)
Sodium: 140 mEq/L (ref 135–145)
Sodium: 138 mEq/L (ref 135–145)

## 2010-06-21 LAB — CBC
HCT: 31.4 % — ABNORMAL LOW (ref 36.0–46.0)
HCT: 34 % — ABNORMAL LOW (ref 36.0–46.0)
Hemoglobin: 10.7 g/dL — ABNORMAL LOW (ref 12.0–15.0)
Hemoglobin: 10.7 g/dL — ABNORMAL LOW (ref 12.0–15.0)
Hemoglobin: 9.9 g/dL — ABNORMAL LOW (ref 12.0–15.0)
MCHC: 31.6 g/dL (ref 30.0–36.0)
MCHC: 32.1 g/dL (ref 30.0–36.0)
MCV: 72.9 fL — ABNORMAL LOW (ref 78.0–100.0)
MCV: 72.9 fL — ABNORMAL LOW (ref 78.0–100.0)
Platelets: 223 10*3/uL (ref 150–400)
Platelets: 223 10*3/uL (ref 150–400)
RBC: 4.31 MIL/uL (ref 3.87–5.11)
RBC: 4.56 MIL/uL (ref 3.87–5.11)
RBC: 4.63 MIL/uL (ref 3.87–5.11)
RDW: 13.5 % (ref 11.5–15.5)
RDW: 14.3 % (ref 11.5–15.5)
WBC: 6.1 10*3/uL (ref 4.0–10.5)
WBC: 7.6 10*3/uL (ref 4.0–10.5)

## 2010-06-21 LAB — GLUCOSE, CAPILLARY
Glucose-Capillary: 106 mg/dL — ABNORMAL HIGH (ref 70–99)
Glucose-Capillary: 115 mg/dL — ABNORMAL HIGH (ref 70–99)
Glucose-Capillary: 126 mg/dL — ABNORMAL HIGH (ref 70–99)
Glucose-Capillary: 129 mg/dL — ABNORMAL HIGH (ref 70–99)
Glucose-Capillary: 131 mg/dL — ABNORMAL HIGH (ref 70–99)
Glucose-Capillary: 141 mg/dL — ABNORMAL HIGH (ref 70–99)
Glucose-Capillary: 141 mg/dL — ABNORMAL HIGH (ref 70–99)
Glucose-Capillary: 80 mg/dL (ref 70–99)
Glucose-Capillary: 95 mg/dL (ref 70–99)

## 2010-06-21 LAB — COMPREHENSIVE METABOLIC PANEL
ALT: 21 U/L (ref 0–35)
Alkaline Phosphatase: 113 U/L (ref 39–117)
CO2: 27 mEq/L (ref 19–32)
GFR calc non Af Amer: >60 mL/min (ref >60)
Glucose, Bld: 184 mg/dL — ABNORMAL HIGH (ref 70–99)
Potassium: 4.2 mEq/L (ref 3.5–5.1)
Sodium: 141 mEq/L (ref 135–145)
Total Bilirubin: 0.3 mg/dL (ref 0.3–1.2)

## 2010-06-21 LAB — HEPATIC FUNCTION PANEL
ALT: 17 U/L (ref 0–35)
Albumin: 3.2 g/dL — ABNORMAL LOW (ref 3.5–5.2)
Alkaline Phosphatase: 74 U/L (ref 39–117)
Total Bilirubin: 0.4 mg/dL (ref 0.3–1.2)

## 2010-06-21 LAB — URINALYSIS, ROUTINE W REFLEX MICROSCOPIC
Bilirubin Urine: NEGATIVE
Glucose, UA: NEGATIVE mg/dL
Nitrite: NEGATIVE
Specific Gravity, Urine: 1.015 (ref 1.005–1.030)
pH: 6.5 (ref 5.0–8.0)

## 2010-06-21 LAB — DIFFERENTIAL
Basophils Absolute: 0 10*3/uL (ref 0.0–0.1)
Basophils Absolute: 0 10*3/uL (ref 0.0–0.1)
Basophils Relative: 0 % (ref 0–1)
Basophils Relative: 0 % (ref 0–1)
Basophils Relative: 0 % (ref 0–1)
Eosinophils Absolute: 0.1 10*3/uL (ref 0.0–0.7)
Eosinophils Absolute: 0.2 10*3/uL (ref 0.0–0.7)
Eosinophils Relative: 3 % (ref 0–5)
Monocytes Absolute: 0.4 10*3/uL (ref 0.1–1.0)
Monocytes Relative: 7 % (ref 3–12)
Monocytes Relative: 7 % (ref 3–12)
Monocytes Relative: 8 % (ref 3–12)
Neutro Abs: 5.3 10*3/uL (ref 1.7–7.7)
Neutrophils Relative %: 47 % (ref 43–77)
Neutrophils Relative %: 71 % (ref 43–77)

## 2010-06-21 LAB — URINE MICROSCOPIC-ADD ON

## 2010-06-21 LAB — CARDIAC PANEL(CRET KIN+CKTOT+MB+TROPI)
Relative Index: 1.5 (ref 0.0–2.5)
Relative Index: INVALID (ref 0.0–2.5)
Total CK: 134 U/L (ref 7–177)
Total CK: 155 U/L (ref 7–177)
Total CK: 262 U/L — ABNORMAL HIGH (ref 7–177)
Troponin I: 0.01 ng/mL (ref 0.00–0.06)
Troponin I: 0.01 ng/mL (ref 0.00–0.06)

## 2010-06-21 LAB — LIPASE, BLOOD
Lipase: 19 U/L (ref 11–59)
Lipase: 22 U/L (ref 11–59)

## 2010-06-21 LAB — LIPID PANEL
Total CHOL/HDL Ratio: 4.3 RATIO
VLDL: 43 mg/dL — ABNORMAL HIGH (ref 0–40)

## 2010-06-21 LAB — POCT CARDIAC MARKERS: Myoglobin, poc: 66.1 ng/mL (ref 12–200)

## 2010-06-21 LAB — HEMOGLOBIN A1C: Mean Plasma Glucose: 148 mg/dL

## 2010-06-22 ENCOUNTER — Telehealth: Payer: Self-pay | Admitting: Family Medicine

## 2010-06-22 ENCOUNTER — Ambulatory Visit (HOSPITAL_COMMUNITY)
Admission: RE | Admit: 2010-06-22 | Discharge: 2010-06-22 | Disposition: A | Discharge status: Home / Self Care | Payer: Medicare Other | Source: Ambulatory Visit | Attending: *Deleted | Admitting: *Deleted

## 2010-06-22 ENCOUNTER — Ambulatory Visit (HOSPITAL_COMMUNITY): Payer: Medicare Other | Admitting: Physical Therapy

## 2010-06-22 LAB — BASIC METABOLIC PANEL
CO2: 25 mEq/L (ref 19–32)
CO2: 26 mEq/L (ref 19–32)
Calcium: 8.2 mg/dL — ABNORMAL LOW (ref 8.4–10.5)
Calcium: 8.9 mg/dL (ref 8.4–10.5)
Chloride: 111 mEq/L (ref 96–112)
Creatinine, Ser: 0.68 mg/dL (ref 0.4–1.2)
GFR calc Af Amer: >60 mL/min (ref >60)
GFR calc Af Amer: >60 mL/min (ref >60)
GFR calc non Af Amer: 50 mL/min — ABNORMAL LOW (ref >60)
Glucose, Bld: 147 mg/dL — ABNORMAL HIGH (ref 70–99)
Potassium: 4.3 mEq/L (ref 3.5–5.1)
Potassium: 4.5 mEq/L (ref 3.5–5.1)
Sodium: 138 mEq/L (ref 135–145)
Sodium: 138 mEq/L (ref 135–145)
Sodium: 142 mEq/L (ref 135–145)

## 2010-06-22 LAB — CBC
HCT: 30 % — ABNORMAL LOW (ref 36.0–46.0)
HCT: 30.7 % — ABNORMAL LOW (ref 36.0–46.0)
Hemoglobin: 10 g/dL — ABNORMAL LOW (ref 12.0–15.0)
Hemoglobin: 9 g/dL — ABNORMAL LOW (ref 12.0–15.0)
Hemoglobin: 9.5 g/dL — ABNORMAL LOW (ref 12.0–15.0)
MCHC: 31.8 g/dL (ref 30.0–36.0)
MCHC: 32 g/dL (ref 30.0–36.0)
MCV: 72.9 fL — ABNORMAL LOW (ref 78.0–100.0)
RBC: 3.86 MIL/uL — ABNORMAL LOW (ref 3.87–5.11)
RBC: 4.21 MIL/uL (ref 3.87–5.11)
RDW: 14.6 % (ref 11.5–15.5)
RDW: 14.9 % (ref 11.5–15.5)
WBC: 4.7 10*3/uL (ref 4.0–10.5)
WBC: 7.9 10*3/uL (ref 4.0–10.5)

## 2010-06-22 LAB — GLUCOSE, CAPILLARY
Glucose-Capillary: 125 mg/dL — ABNORMAL HIGH (ref 70–99)
Glucose-Capillary: 137 mg/dL — ABNORMAL HIGH (ref 70–99)
Glucose-Capillary: 138 mg/dL — ABNORMAL HIGH (ref 70–99)
Glucose-Capillary: 158 mg/dL — ABNORMAL HIGH (ref 70–99)
Glucose-Capillary: 171 mg/dL — ABNORMAL HIGH (ref 70–99)
Glucose-Capillary: 215 mg/dL — ABNORMAL HIGH (ref 70–99)

## 2010-06-22 LAB — DIFFERENTIAL
Basophils Absolute: 0 10*3/uL (ref 0.0–0.1)
Basophils Absolute: 0.1 10*3/uL (ref 0.0–0.1)
Basophils Relative: 0 % (ref 0–1)
Basophils Relative: 0 % (ref 0–1)
Eosinophils Absolute: 0.2 10*3/uL (ref 0.0–0.7)
Eosinophils Relative: 4 % (ref 0–5)
Lymphocytes Relative: 32 % (ref 12–46)
Lymphs Abs: 1.9 10*3/uL (ref 0.7–4.0)
Lymphs Abs: 2.6 10*3/uL (ref 0.7–4.0)
Monocytes Absolute: 0.5 10*3/uL (ref 0.1–1.0)
Monocytes Absolute: 0.6 10*3/uL (ref 0.1–1.0)
Monocytes Absolute: 0.7 10*3/uL (ref 0.1–1.0)
Monocytes Relative: 11 % (ref 3–12)
Monocytes Relative: 9 % (ref 3–12)
Neutro Abs: 2.1 10*3/uL (ref 1.7–7.7)
Neutro Abs: 4.4 10*3/uL (ref 1.7–7.7)
Neutrophils Relative %: 45 % (ref 43–77)

## 2010-06-22 LAB — RAPID URINE DRUG SCREEN, HOSP PERFORMED
Amphetamines: NOT DETECTED
Benzodiazepines: POSITIVE — AB
Cocaine: NOT DETECTED

## 2010-06-22 LAB — HEPATIC FUNCTION PANEL
ALT: 19 U/L (ref 0–35)
Alkaline Phosphatase: 89 U/L (ref 39–117)
Bilirubin, Direct: <0.1 mg/dL (ref 0.0–0.3)
Indirect Bilirubin: 0.3 mg/dL (ref 0.3–0.9)

## 2010-06-22 LAB — AMMONIA: Ammonia: 39 umol/L — ABNORMAL HIGH (ref 11–35)

## 2010-06-22 NOTE — Telephone Encounter (Signed)
Patient states Dr put her on insulin but the vial and she does not know how to draw this up, has had pen insulin in the past.  Should we refer to home health to have a nurse help patient with this?

## 2010-06-22 NOTE — Telephone Encounter (Signed)
Yes pls do, I do not think that ins will pay for a pen since she has the vial already

## 2010-06-24 ENCOUNTER — Telehealth: Payer: Self-pay | Admitting: *Deleted

## 2010-06-24 NOTE — Telephone Encounter (Signed)
Patient has been referred to [facility] for help with medication management, mainly her insulin therapy. Patient is aware and agrees

## 2010-06-27 LAB — BASIC METABOLIC PANEL
BUN: 27 mg/dL — ABNORMAL HIGH (ref 6–23)
Calcium: 9.4 mg/dL (ref 8.4–10.5)
Creatinine, Ser: 1.05 mg/dL (ref 0.4–1.2)
GFR calc non Af Amer: 54 mL/min — ABNORMAL LOW (ref >60)
Glucose, Bld: 132 mg/dL — ABNORMAL HIGH (ref 70–99)

## 2010-06-27 NOTE — Telephone Encounter (Signed)
Yes we can, same dose, try first and see if the ins will cover  The pen, she just got a vial, if not we have to request samples!

## 2010-06-28 ENCOUNTER — Ambulatory Visit (HOSPITAL_COMMUNITY): Payer: Medicare Other

## 2010-06-28 LAB — CBC
MCV: 72.8 fL — ABNORMAL LOW (ref 78.0–100.0)
Platelets: 256 10*3/uL (ref 150–400)
RBC: 4.54 MIL/uL (ref 3.87–5.11)
WBC: 8.7 10*3/uL (ref 4.0–10.5)

## 2010-06-28 LAB — BASIC METABOLIC PANEL
BUN: 21 mg/dL (ref 6–23)
Calcium: 9.5 mg/dL (ref 8.4–10.5)
Creatinine, Ser: 0.73 mg/dL (ref 0.4–1.2)
GFR calc Af Amer: >60 mL/min (ref >60)
GFR calc non Af Amer: >60 mL/min (ref >60)

## 2010-06-28 LAB — APTT: aPTT: 27 seconds (ref 24–37)

## 2010-06-28 LAB — PROTIME-INR
INR: 0.9 (ref 0.00–1.49)
Prothrombin Time: 12.6 seconds (ref 11.6–15.2)

## 2010-06-28 LAB — GLUCOSE, CAPILLARY: Glucose-Capillary: 107 mg/dL — ABNORMAL HIGH (ref 70–99)

## 2010-06-29 ENCOUNTER — Encounter: Payer: Self-pay | Admitting: Family Medicine

## 2010-06-29 NOTE — Progress Notes (Signed)
  Subjective:    Patient ID: [name], female    DOB: 04/08/1950, 60 y.o.   MRN: [id number]  Hypertension This is a chronic problem. The current episode started more than 1 year ago. The problem is controlled. Associated symptoms include headaches. Past treatments include diuretics, central alpha agonists and beta blockers. The current treatment provides moderate improvement. There are no compliance problems.   Headache  Associated symptoms include back pain. Her past medical history is significant for hypertension.  Back Pain Associated symptoms include headaches.  Diabetes Hypoglycemia symptoms include headaches.   The PT is here for follow up and re-evaluation of chronic medical conditions, medication management and review of recent lab and radiology data.She unfortunately has been experiencing uncontrolled blood sugars, and now requires insulin, which she had been on years ago. Sh e is also experiencing increased back pain and has surgery scheduled. She has had several falls Preventive health is updated, specifically  Cancer screening, Osteoporosis screening and Immunization.   Questions or concerns regarding consultations or procedures which the PT has had in the interim are  addressed. The PT denies any adverse reactions to current medications since the last visit.  Her mental health is stable, she denies uncontrolled depression or anxiety, and continues to c/o poor sleep.     Review of Systems  Musculoskeletal: Positive for back pain.  Neurological: Positive for headaches.  Denies recent fever or chills. Denies sinus pressure, nasal congestion, ear pain or sore throat. Denies chest congestion, productive cough or wheezing. Denies chest pains, palpitations, paroxysmal nocturnal dyspnea, orthopnea and leg swelling She experiences intermittent abdominal pain with alternating constipation or and diareah due to IBS, she is primarily diareah predominant.  Denies rectal bleeding    Denies uncontrolled depression or  Anxiety, she does have  insomnia. Denies skin break down or rash.         Objective:   Physical Exam Patient alert and oriented and in no Cardiopulmonary distress.  HEENT: No facial asymmetry, EOMI, no sinus tenderness, TM's clear, Oropharynx pink and moistPoor dentition Neck supple no adenopathy.  Chest: Clear to auscultation bilaterally.  CVS: S1, S2 no murmurs, no S3.  ABD: Soft non tender. Bowel sounds normal.  Ext: No edema  WU:[id number]  ROM spine,adequate in  shoulders, hips and knees.  Skin: Intact, no ulcerations or rash noted.  Psych: Good eye contact, normal affect. Memory impaired,not anxious or depressed appearing.  CNS: CN 2-12 intact  power, tone and sensation normal throughout.        Assessment & Plan:  1. Type 2 DM : worsened, now requiring insulin 2. Hypertension : controlled, no med change 3. Back pain: deteriorated, weakness, unsteady gait, for surgery 4.Depressioin and anxiety: stable

## 2010-06-30 ENCOUNTER — Ambulatory Visit (HOSPITAL_COMMUNITY): Payer: Medicare Other | Admitting: Physical Therapy

## 2010-07-01 ENCOUNTER — Ambulatory Visit (HOSPITAL_COMMUNITY): Payer: Medicare Other

## 2010-07-05 ENCOUNTER — Other Ambulatory Visit (HOSPITAL_COMMUNITY): Payer: Self-pay | Admitting: Orthopedic Surgery

## 2010-07-05 DIAGNOSIS — M545 Low back pain: Secondary | ICD-10-CM

## 2010-07-06 ENCOUNTER — Ambulatory Visit (HOSPITAL_COMMUNITY): Admission: RE | Admit: 2010-07-06 | Payer: Medicare Other | Source: Ambulatory Visit

## 2010-07-06 ENCOUNTER — Ambulatory Visit (HOSPITAL_COMMUNITY): Payer: Medicare Other | Admitting: *Deleted

## 2010-07-07 ENCOUNTER — Ambulatory Visit (HOSPITAL_COMMUNITY): Payer: Medicare Other | Admitting: *Deleted

## 2010-07-07 ENCOUNTER — Other Ambulatory Visit: Payer: Self-pay | Admitting: Neurology

## 2010-07-07 ENCOUNTER — Telehealth: Payer: Self-pay | Admitting: Family Medicine

## 2010-07-07 DIAGNOSIS — M7989 Other specified soft tissue disorders: Secondary | ICD-10-CM

## 2010-07-07 DIAGNOSIS — M79604 Pain in right leg: Secondary | ICD-10-CM

## 2010-07-07 NOTE — Telephone Encounter (Signed)
Put form in Dr. Luther Parody box to be filled out. Will fax when completed

## 2010-07-08 ENCOUNTER — Ambulatory Visit (HOSPITAL_COMMUNITY): Payer: Medicare Other

## 2010-07-08 ENCOUNTER — Telehealth: Payer: Self-pay | Admitting: Family Medicine

## 2010-07-11 NOTE — Telephone Encounter (Deleted)
Error was not informed not to use the rx refill pool

## 2010-07-12 ENCOUNTER — Encounter: Payer: Self-pay | Admitting: Gastroenterology

## 2010-07-12 ENCOUNTER — Ambulatory Visit: Payer: Medicare Other | Admitting: Gastroenterology

## 2010-07-12 ENCOUNTER — Ambulatory Visit (INDEPENDENT_AMBULATORY_CARE_PROVIDER_SITE_OTHER): Payer: Medicare Other | Admitting: Gastroenterology

## 2010-07-12 DIAGNOSIS — K3184 Gastroparesis: Secondary | ICD-10-CM

## 2010-07-12 DIAGNOSIS — R1312 Dysphagia, oropharyngeal phase: Secondary | ICD-10-CM | POA: Insufficient documentation

## 2010-07-12 DIAGNOSIS — R1310 Dysphagia, unspecified: Secondary | ICD-10-CM

## 2010-07-12 DIAGNOSIS — R1084 Generalized abdominal pain: Secondary | ICD-10-CM

## 2010-07-12 DIAGNOSIS — K219 Gastro-esophageal reflux disease without esophagitis: Secondary | ICD-10-CM

## 2010-07-12 DIAGNOSIS — K222 Esophageal obstruction: Secondary | ICD-10-CM

## 2010-07-12 DIAGNOSIS — Q393 Congenital stenosis and stricture of esophagus: Secondary | ICD-10-CM

## 2010-07-12 NOTE — Assessment & Plan Note (Signed)
Started on Reglan in Feb 2011. On Reglan. Taking 10 mg for morning and dinner dose, 5 for lunch and bedtime. No significant change in nausea. Will leave reglan at current dosing for now; continue to monitor for side effects. After EGD, may need change in current regimen.

## 2010-07-12 NOTE — Assessment & Plan Note (Signed)
Chronic lower abdominal pain, crampy, worsened with eating. TCS most recently in Aug 2011. CT negative. On multiple meds. Hx of IBS. Likely functional abd pain vs IBS. Referred to pain management; saw for first time a week ago.  Continue pain management. Pt on multiple pain medications.

## 2010-07-12 NOTE — Progress Notes (Signed)
Referring Provider: Syliva Overman, MD Primary Care Physician:  Syliva Overman, MD, MD Primary Gastroenterologist: Dr. Jena Gauss   Chief Complaint  Patient presents with  . Follow-up    HPI:   [name] returns today in f/u with hx significant for gastroparesis, GERD, IBS, chronic anemia, and dysphagia. Has complicated hx, multiple medications. On Reglan, tolerating without side effects but persistent nausea. Has had extensive work-up in past year for dysphagia and abdominal pain. Recent ifobt in March negative. Hgb actually improved.  Continues to complain of dysphagia. Last EGD/ED in August 2011 with non-critical Schatzki's ring, s/p dilatation. Failed manometry at [facility] in Jan 2011.  Complains of lower abdominal pain, chronic, that balls up. Feels like a hammer, intermittent, not relieved by BM. Going to pain management, first time last week. CT Jan 2012 benign. [name] benign.  +nausea, hx of gastroparesis. Following gastroparesis diet. Feels like nauseated all the time, every day, except when sleeping. BM 4-5 times per day, soft, not diarrhea. No melena or brbpr.   Past Medical History  Diagnosis Date  . Bipolar disorder   . CVA (cerebral infarction)   . Pancreatitis 2006    due to Depakote with normal EUS   . Osteoporosis   . Sleep apnea   . Chronic back pain   . Diabetes mellitus   . Trigger finger   . Anxiety disorder   . Hypertension   . Migraines   . Diverticulosis     TCS 9/08 by Dr. Lina Sar for diarrhea . Bx for micro scopic colitis .   [name] Schatzki's ring     non critical / EGD with ED 8/2011with RMR  . S/P colonoscopy [id number], 2011    left-sided diverticula, hx of simple adenomas   . Glaucoma     Past Surgical History  Procedure Date  . Abdominal hysterectomy   . Cholecystectomy   . Ovarian cyst removal   . Carpal tunnel release 07/22/04    left/ Dr. Romeo Apple   . Breast reduction surgery   . Bilateral cataract surgery   . Biopsy of thyroid gland 2009   . Surgical excision of 3 tumors from right thigh and right buttock  and left upper thigh 2010    Current Outpatient Prescriptions  Medication Sig Dispense Refill  . amLODipine-benazepril (LOTREL) 5-40 MG per capsule Take 1 capsule by mouth daily.        . BD INSULIN SYRINGE ULTRAFINE 31G X 5/16" 0.3 ML MISC USE AS DIRECTED.  100 each  3  . cloNIDine (CATAPRES) 0.3 MG tablet TAKE 1 TABLET BY MOUTH 3 TIMES DAILY: TAKE AT 8A.M.;4 P.M.; AND         MIDNIGHT.  90 tablet  3  . CRESTOR 10 MG tablet TAKE 1 TABLET BY MOUTH ATBEDTIME.  30 each  3  . diclofenac sodium (VOLTAREN) 1 % GEL Apply topically. Apply twice a day to affected area as needed       . Diphenhyd-Hydrocort-Nystatin (FIRST-DUKES MOUTHWASH) SUSP Use as directed in the mouth or throat. Take 2 teaspoons three times daily as needed for hoarseness       . diphenoxylate-atropine (LOMOTIL) 2.5-0.025 MG per tablet Take 1 tablet by mouth.        . ergocalciferol (VITAMIN D2) 50000 UNITS capsule Take 50,000 Units by mouth once a week.        [name] EVOXAC 30 MG capsule TAKE (1) CAPSULE BY MOUTHTHREE TIMES A DAY.  90 each  3  . FLEXERIL 10 MG  tablet TAKE 1 TABLET BY MOUTH   TWICE DAILY.  60 each  3  . furosemide (LASIX) 20 MG tablet Take 20 mg by mouth daily as needed.        [name] glipiZIDE (GLUCOTROL) 10 MG tablet TAKE 1 TABLET BY MOUTH   TWICE DAILY.  60 tablet  3  . hydrochlorothiazide (,MICROZIDE/HYDRODIURIL,) 12.5 MG capsule TAKE ONE CAPSULE DAILY.  30 capsule  3  . lamoTRIgine (LAMICTAL) 100 MG tablet Take 100 mg by mouth daily.        [name] levothyroxine (LEVOXYL) 50 MCG tablet Take 50 mcg by mouth daily.        [name] LORazepam (ATIVAN) 1 MG tablet Take 1 mg by mouth every 8 (eight) hours.        . meclizine (ANTIVERT) 12.5 MG tablet Take 12.5 mg by mouth 3 (three) times daily as needed. For nausea        . Metformin HCl (RIOMET) 500 MG/5ML SOLN Take by mouth. Take 2 teaspoons by mouth twice a day , dose increase effective 04/26/2010. Stop the tablets,  pt. Cannot swallow       . metoCLOPramide (REGLAN) 5 MG tablet Take 5 mg by mouth as directed. Take 2 tablets before breakfast, 1 tablet before lunch, 2 tabs before dinner, 1 tab at bedtime       . metoprolol (LOPRESSOR) 50 MG tablet Take 50 mg by mouth 2 (two) times daily.        . mirtazapine (REMERON) 30 MG tablet Take 30 mg by mouth. Take one tablet by mouth three times daily         . nystatin (NYSTOP) 100000 UNIT/GM POWD Apply topically 2 (two) times daily.        . ONGLYZA 5 MG TABS tablet TAKE 1 TABLET BY MOUTH   ONCE DAILY.  30 tablet  3  . PLAVIX 75 MG tablet TAKE ONE TABLET BY MOUTH ONCE DAILY.  30 each  3  . potassium chloride SA (KLOR-CON M20) 20 MEQ tablet Take 20 mEq by mouth. As needed with furosemide         . RABEprazole (ACIPHEX) 20 MG tablet Take 20 mg by mouth daily.        . sertraline (ZOLOFT) 50 MG tablet Take 50 mg by mouth. Take three tablets by mouth once daily       . SINGULAIR 10 MG tablet TAKE ONE TABLET DAILY.  30 each  3  . thiothixene (NAVANE) 2 MG capsule Take 2 mg by mouth at bedtime.        . traZODone (DESYREL) 150 MG tablet Take 150 mg by mouth at bedtime.          Allergies as of 07/12/2010 - Review Complete 07/12/2010  Allergen Reaction Noted  . Cephalexin    . Milk-related compounds  07/29/2008  . Penicillins    . Phenazopyridine hcl  06/19/2007    Family History  Problem Relation Age of Onset  . Heart attack Mother   . Pneumonia Father   . Kidney failure Father   . Cancer Sister     pancreatic  . Diabetes Brother   . Hypertension Brother   . Colon cancer Neg Hx     History   Social History  . Marital Status: Divorced    Spouse Name: N/A    Number of Children: 1  . Years of Education: N/A   Occupational History  . disabled     Social History Main Topics  .  Smoking status: Former Games developer  . Smokeless tobacco: None  . Alcohol Use: No  . Drug Use: No   Review of Systems: Gen: Denies fever, chills, anorexia. Denies fatigue,  weakness, weight loss.  CV: Denies chest pain, palpitations, syncope, peripheral edema, and claudication. Resp: Denies dyspnea at rest, cough, wheezing, coughing up blood, and pleurisy. GI: SEE HPI Derm: Denies rash, itching, dry skin Psych: Denies depression, anxiety, memory loss, confusion. No homicidal or suicidal ideation.  Heme: Denies bruising, bleeding, and enlarged lymph nodes.  Physical Exam: BP 124/78  Pulse 82  Temp(Src) 97 F (36.1 C) (Tympanic)  Ht 5\' 1"  (1.549 m)  Wt 168 lb 6.4 oz (76.386 kg)  BMI 31.82 kg/m2 General:   Alert and oriented. No distress noted. Drowsy, falls asleep easily.  Head:  Normocephalic and atraumatic. Eyes:  Conjuctiva clear without scleral icterus. Mouth:  Oral mucosa pink and moist. Good dentition. No lesions. Neck:  Supple, without mass or thyromegaly. Heart:  S1, S2 present without murmurs, rubs, or gallops. Regular rate and rhythm. Abdomen:  +BS, soft, non-tender and non-distended. No rebound or guarding. No HSM or masses noted. Msk:  Symmetrical without gross deformities. Normal posture. Extremities:  Without edema. Neurologic:  Alert and  oriented x4;  grossly normal neurologically. Skin:  Intact without significant lesions or rashes. Cervical Nodes:  No significant cervical adenopathy. Psych:  Alert and cooperative. Flat affect but pleasant.

## 2010-07-12 NOTE — Patient Instructions (Signed)
You have been set up for an endoscopy. Please only eat/drink clear liquids the day before endoscopy so that we may see your stomach best due to your history of gastroparesis.  Take only 1/2 dose of glucotrol the evening before the test. Do not take any the morning of the test. Take 1/2 dose of metformin the evening before the test. Do not take any the morning of the test. No Lantus the evening before.   Continue seeing pain management.   We will see you back in 3 months.

## 2010-07-12 NOTE — Assessment & Plan Note (Signed)
60 year old African-American female with somewhat complicated past hx and on multiple medications. Most recent EGD in August 2011 with non-critical Schatzki's ring, s/p dilatation. Prior to this, underwent BPE Jan 2011 showing esophageal dysmotility, 13mm tablet lodging, MBSS 04/06/09 minimal oral and swallowing dysfunction. Failed manometry at [facility]. Differentials for dysphagia include Schatzki's ring, web, or stricture. Question of esophageal dysmotility large contributor to symptoms. Will proceed with EGD/ED with possible referral to [facility] again once completed for updated manometry.   EGD/ED with Dr. Jena Gauss in the near future: the R/B/A have been discussed in detail; pt states understanding and desires to proceed. To be done in the OR with propofol and assistance of anesthesia secondary to hx of polypharmacy.  See instructions for diabetes meds under pt instructions

## 2010-07-13 ENCOUNTER — Encounter: Payer: Self-pay | Admitting: Family Medicine

## 2010-07-13 NOTE — Progress Notes (Signed)
Cc to PCP 

## 2010-07-14 ENCOUNTER — Telehealth: Payer: Self-pay | Admitting: Family Medicine

## 2010-07-14 ENCOUNTER — Other Ambulatory Visit (HOSPITAL_COMMUNITY): Payer: Medicare Other

## 2010-07-14 ENCOUNTER — Ambulatory Visit: Payer: Medicare Other | Admitting: Gastroenterology

## 2010-07-15 ENCOUNTER — Other Ambulatory Visit: Payer: Self-pay | Admitting: Internal Medicine

## 2010-07-15 ENCOUNTER — Encounter (HOSPITAL_COMMUNITY): Payer: Medicare Other

## 2010-07-15 LAB — COMPREHENSIVE METABOLIC PANEL
ALT: 24 U/L (ref 0–35)
Alkaline Phosphatase: 144 U/L — ABNORMAL HIGH (ref 39–117)
Chloride: 101 mEq/L (ref 96–112)
Glucose, Bld: 184 mg/dL — ABNORMAL HIGH (ref 70–99)
Potassium: 3.9 mEq/L (ref 3.5–5.1)
Sodium: 140 mEq/L (ref 135–145)
Total Bilirubin: 0.2 mg/dL — ABNORMAL LOW (ref 0.3–1.2)
Total Protein: 7.3 g/dL (ref 6.0–8.3)

## 2010-07-15 LAB — HEMOGLOBIN AND HEMATOCRIT, BLOOD: Hemoglobin: 10.3 g/dL — ABNORMAL LOW (ref 12.0–15.0)

## 2010-07-18 ENCOUNTER — Ambulatory Visit (HOSPITAL_COMMUNITY)
Admission: RE | Admit: 2010-07-18 | Discharge: 2010-07-18 | Disposition: A | Discharge status: Home / Self Care | Payer: Medicare Other | Source: Ambulatory Visit | Attending: Neurology | Admitting: Neurology

## 2010-07-18 DIAGNOSIS — I871 Compression of vein: Secondary | ICD-10-CM | POA: Insufficient documentation

## 2010-07-18 DIAGNOSIS — M7989 Other specified soft tissue disorders: Secondary | ICD-10-CM | POA: Insufficient documentation

## 2010-07-18 DIAGNOSIS — M79609 Pain in unspecified limb: Secondary | ICD-10-CM | POA: Insufficient documentation

## 2010-07-18 DIAGNOSIS — M79604 Pain in right leg: Secondary | ICD-10-CM

## 2010-07-19 ENCOUNTER — Telehealth: Payer: Self-pay | Admitting: Family Medicine

## 2010-07-19 IMAGING — MR MR LUMBAR SPINE W/O CM
4 of 6 series · 12 of 48 positions shown · non-contrast
Comparison: None

CLINICAL DATA: Back and bilateral lower extremity pain.

MRI LUMBAR SPINE WITHOUT CONTRAST
TECHNIQUE: Multiplanar and multiecho pulse sequences of the lumbar
spine were obtained without intravenous contrast.

[Series 3: T2 · sagittal · 4.0mm · 0.29mm/px · 3 of 26 slices shown (1 of 2)]
[im 3/26]
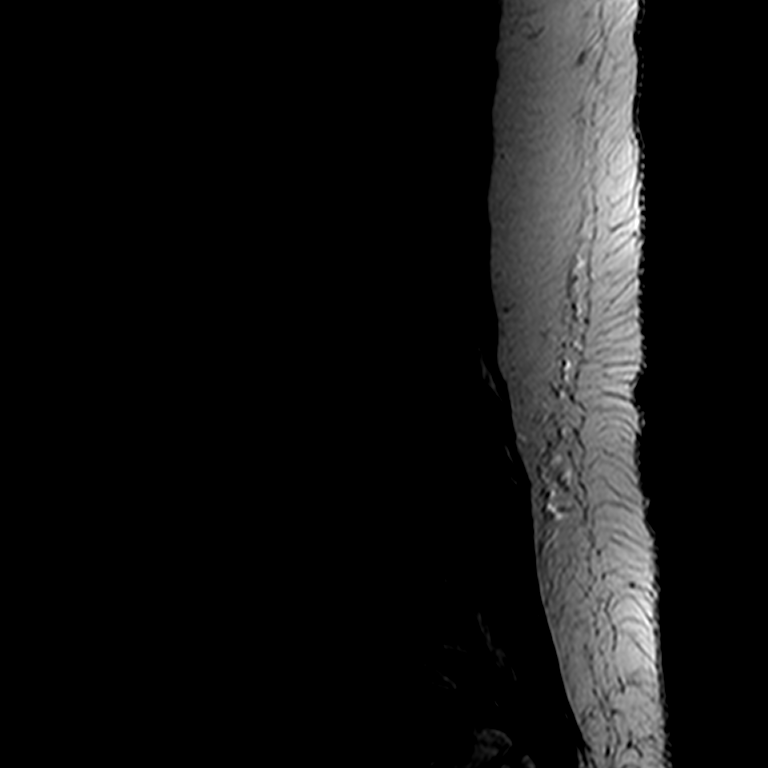
[im 14/26]
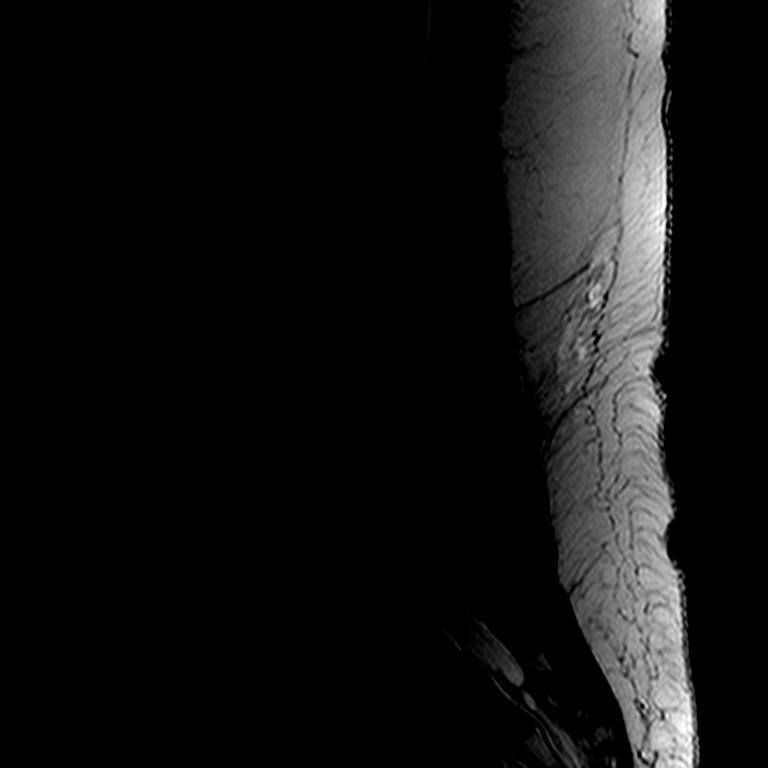
[im 23/26]
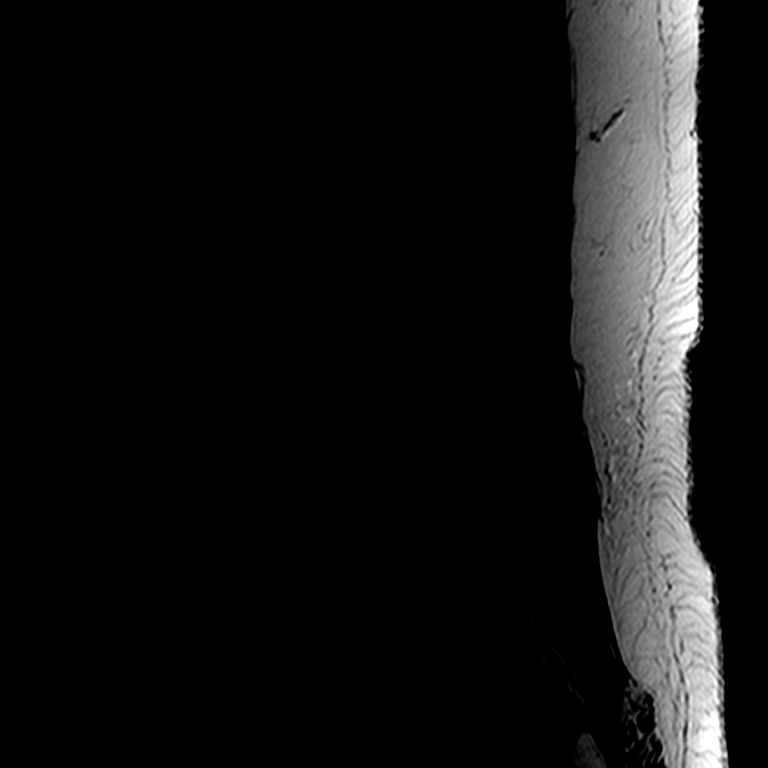

[Series 4: T1 · sagittal · 4.0mm · 0.38mm/px · 3 of 13 slices shown (1 of 2)]
[im 1/13]
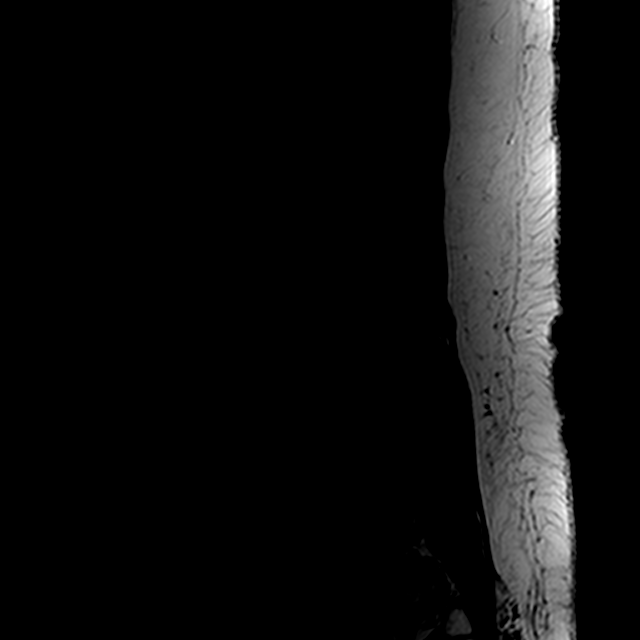
[im 7/13]
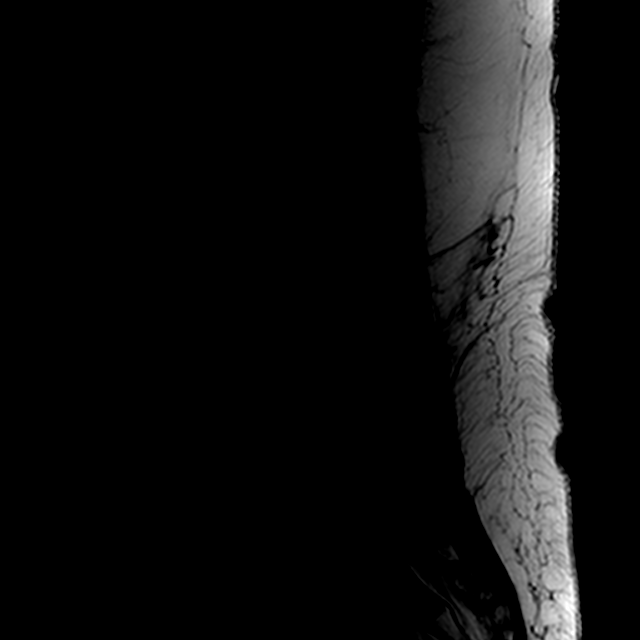
[im 13/13]
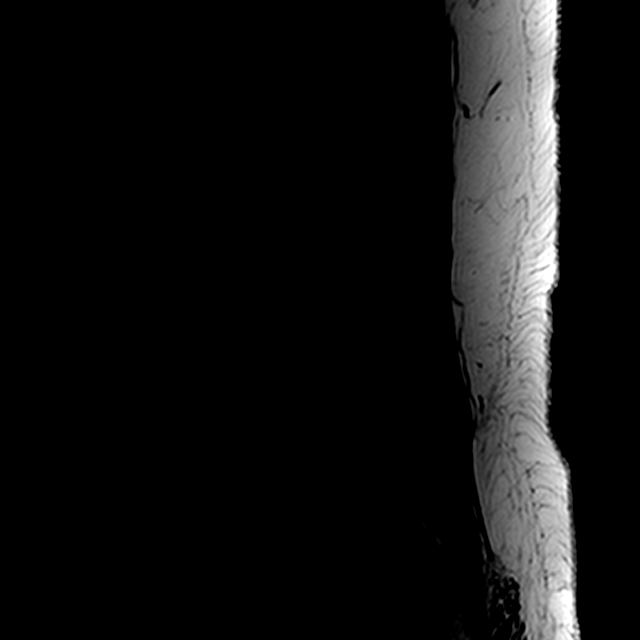

[Series 7: T2 · axial · 4.0mm · 0.30mm/px · z∈[-140,+11]mm · 3 of 25 slices shown (2 of 2)]
[im 4/25]
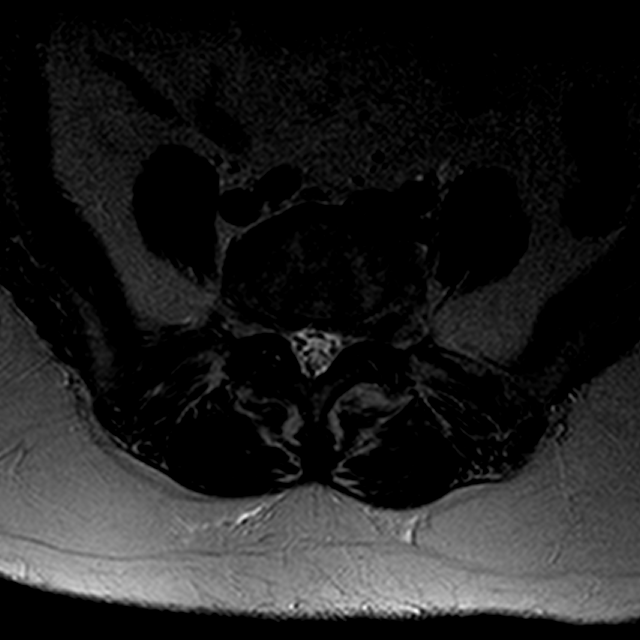
[im 13/25]
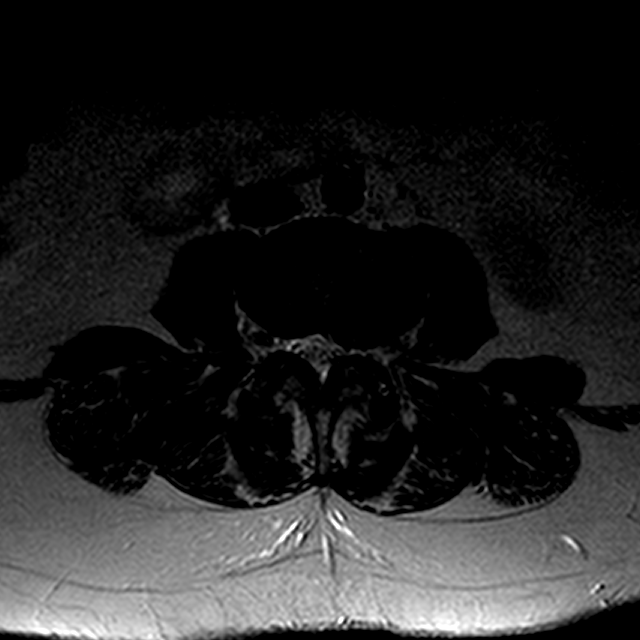
[im 22/25]
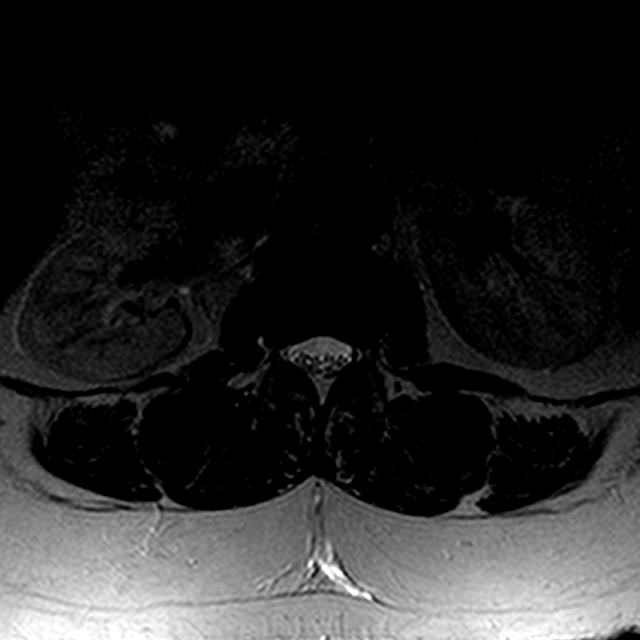

[Series 8: T1 · axial · 4.0mm · 0.39mm/px · z∈[-140,+10]mm · 3 of 25 slices shown (2 of 2)]
[im 4/25]
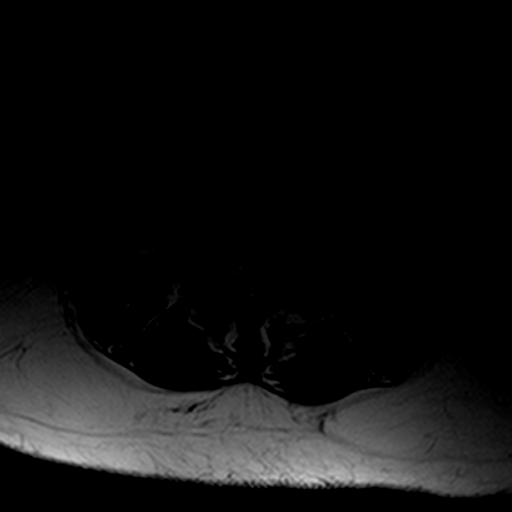
[im 13/25]
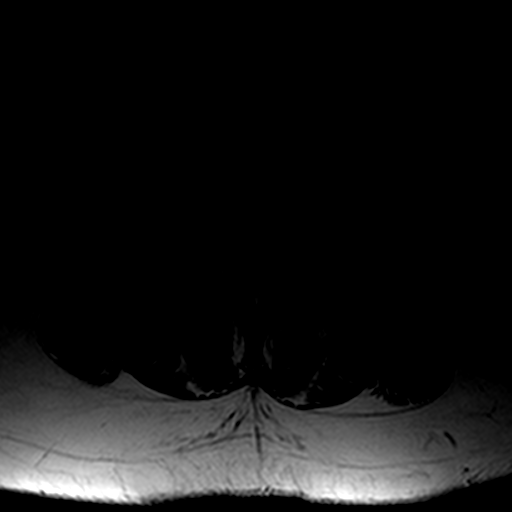
[im 22/25]
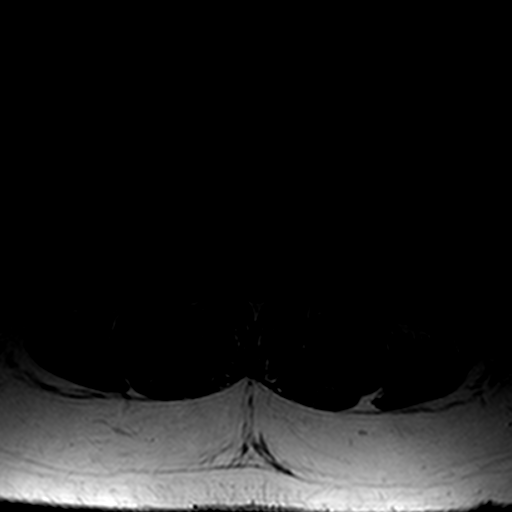

[12 of 48 positions shown; findings below may reference images not displayed]

FINDINGS: The sagittal MR images demonstrate normal overall
alignment of the lumbar vertebral bodies.  They demonstrate normal
marrow signal except for endplate reactive changes mainly at L4-5
and L5-S1.  The last full intervertebral disc space is labeled L5-
S1 and the conus medullaris terminates at the top of L1.

L1-2:  Minimal disc bulge.  No spinal or foraminal stenosis.

L2-3:  No significant findings.

L3-4:  There is a broad-based left paracentral and foraminal disc
protrusion.  This creates left lateral recess and foraminal
stenosis and could involve both the left L3 and L4 nerve roots.
Mild right-sided lateral recess encroachment.  Moderate
hypertrophic facet disease.

L4-5:  Broad-based disc protrusion in combination with short
pedicles and facet disease creates mild to moderate spinal and
lateral recess stenosis.  There is also mild foraminal stenosis
bilaterally.

L5-S1:  Broad-based disc protrusion with a significant left
foraminal disc protrusion.  There is mild spinal and mild to
moderate lateral recess stenosis bilaterally.  There is significant
left foraminal stenosis and mild right foraminal stenosis.
IMPRESSION: Significant disc protrusions at L3-4, L4-5 and L5-S1 contributing
to spinal, lateral recess and foraminal stenosis as specifically
discussed above at these levels.

## 2010-07-20 ENCOUNTER — Other Ambulatory Visit: Payer: Self-pay | Admitting: Gastroenterology

## 2010-07-20 NOTE — Telephone Encounter (Signed)
[name] has spoken with patient

## 2010-07-20 NOTE — Telephone Encounter (Signed)
Patient states her sugar has been going up really high 822, 91, 923, 187, 219, 51, 166, 241, 303 I am not sure this patients meter is accurate. I did not think meters could read that high. Patient admits she may not be using machine correctly

## 2010-07-20 NOTE — Telephone Encounter (Signed)
I do not know of any meter reading that high, and her numbers reporteds are too eratic. She needs assistance from home health till better control, with med admin and testing. Also needs to go to diabetic ed cllass at [facility]. Pls  Document current dose of insulin on med list, not there now

## 2010-07-21 ENCOUNTER — Encounter: Payer: Medicare Other | Admitting: Internal Medicine

## 2010-07-21 ENCOUNTER — Ambulatory Visit (HOSPITAL_COMMUNITY)
Admission: RE | Admit: 2010-07-21 | Discharge: 2010-07-22 | Disposition: A | Discharge status: Home / Self Care | Payer: Medicare Other | Source: Ambulatory Visit | Attending: Internal Medicine | Admitting: Internal Medicine

## 2010-07-21 DIAGNOSIS — Z79899 Other long term (current) drug therapy: Secondary | ICD-10-CM | POA: Insufficient documentation

## 2010-07-21 DIAGNOSIS — M4807 Spinal stenosis, lumbosacral region: Secondary | ICD-10-CM

## 2010-07-21 DIAGNOSIS — J449 Chronic obstructive pulmonary disease, unspecified: Secondary | ICD-10-CM | POA: Insufficient documentation

## 2010-07-21 DIAGNOSIS — J4489 Other specified chronic obstructive pulmonary disease: Secondary | ICD-10-CM | POA: Insufficient documentation

## 2010-07-21 DIAGNOSIS — Z794 Long term (current) use of insulin: Secondary | ICD-10-CM | POA: Insufficient documentation

## 2010-07-21 DIAGNOSIS — I10 Essential (primary) hypertension: Secondary | ICD-10-CM | POA: Insufficient documentation

## 2010-07-21 DIAGNOSIS — R1310 Dysphagia, unspecified: Secondary | ICD-10-CM | POA: Insufficient documentation

## 2010-07-21 DIAGNOSIS — K449 Diaphragmatic hernia without obstruction or gangrene: Secondary | ICD-10-CM | POA: Insufficient documentation

## 2010-07-21 DIAGNOSIS — E119 Type 2 diabetes mellitus without complications: Secondary | ICD-10-CM | POA: Insufficient documentation

## 2010-07-21 LAB — GLUCOSE, CAPILLARY: Glucose-Capillary: 213 mg/dL — ABNORMAL HIGH (ref 70–99)

## 2010-07-22 ENCOUNTER — Emergency Department (HOSPITAL_COMMUNITY)
Admission: EM | Admit: 2010-07-22 | Discharge: 2010-07-22 | Disposition: A | Discharge status: Home / Self Care | Payer: Medicare Other | Attending: Emergency Medicine | Admitting: Emergency Medicine

## 2010-07-22 ENCOUNTER — Emergency Department (HOSPITAL_COMMUNITY): Payer: Medicare Other

## 2010-07-22 ENCOUNTER — Emergency Department (HOSPITAL_COMMUNITY): Admission: EM | Admit: 2010-07-22 | Payer: Medicare Other | Source: Home / Self Care

## 2010-07-22 DIAGNOSIS — IMO0002 Reserved for concepts with insufficient information to code with codable children: Secondary | ICD-10-CM | POA: Insufficient documentation

## 2010-07-22 DIAGNOSIS — Z8673 Personal history of transient ischemic attack (TIA), and cerebral infarction without residual deficits: Secondary | ICD-10-CM | POA: Insufficient documentation

## 2010-07-22 DIAGNOSIS — E119 Type 2 diabetes mellitus without complications: Secondary | ICD-10-CM | POA: Insufficient documentation

## 2010-07-22 DIAGNOSIS — I10 Essential (primary) hypertension: Secondary | ICD-10-CM | POA: Insufficient documentation

## 2010-07-22 DIAGNOSIS — S93409A Sprain of unspecified ligament of unspecified ankle, initial encounter: Secondary | ICD-10-CM | POA: Insufficient documentation

## 2010-07-22 DIAGNOSIS — Y92009 Unspecified place in unspecified non-institutional (private) residence as the place of occurrence of the external cause: Secondary | ICD-10-CM | POA: Insufficient documentation

## 2010-07-22 DIAGNOSIS — Z79899 Other long term (current) drug therapy: Secondary | ICD-10-CM | POA: Insufficient documentation

## 2010-07-22 DIAGNOSIS — W010XXA Fall on same level from slipping, tripping and stumbling without subsequent striking against object, initial encounter: Secondary | ICD-10-CM | POA: Insufficient documentation

## 2010-07-22 NOTE — Op Note (Signed)
  NAME:  [name], [name]                  ACCOUNT NO.:  [id number]  MEDICAL RECORD NO.:  [id number]           PATIENT TYPE:  O  LOCATION:  DAYP                          FACILITY:  [facility]  PHYSICIAN:  R. Roetta Sessions, M.D. DATE OF BIRTH:  1950-05-04  DATE OF PROCEDURE:  07/21/2010 DATE OF DISCHARGE:                              OPERATIVE REPORT   PROCEDURE:  EGD with Elease Hashimoto dilation.  INDICATIONS FOR PROCEDURE:  The patient is a complicated 60 year old lady with recurrent esophageal dysphagia prior EGD dilation in August 2011, it did help with dilating a noncritical Schatzki's ring.  EGD is now being done.  Because of polypharmacy, she is being done under deep sedation with the help of anesthesia.  Risks, benefits, limitations, alternatives, imponderables have been discussed, questions answered. Please see the documentation in the medical record.  PROCEDURE NOTE:  O2 saturation, blood pressure, pulse, respirations were monitored throughout the entire procedure, propofol per Dr. [facility].  Cetacaine spray for topical pharyngeal anesthesia.  INSTRUMENT:  Pentax video chip system.  FINDINGS:  Examination of tubular esophagus revealed a widely patent normal-appearing tubular esophagus.  EG junction was easily traversed.  Stomach:  Gastric cavity was emptied and insufflated well with air. Thorough examination of gastric mucosa including retroflexion of proximal stomach, esophagogastric junction demonstrate only small hiatal hernia.  Pylorus was patent, easily traversed.  Examination  of bulb and second portion revealed no abnormalities.  THERAPEUTIC/DIAGNOSTIC MANEUVERS PERFORMED:  The scope was withdrawn.  A 56-French Maloney dilator was passed to full insertion with ease.  A look back revealed no apparent complication related to passage of the dilator.  The patient tolerated the procedure well and was reactive to endoscopy.  IMPRESSION: 1. Normal-appearing patent  esophagus status post passage of Maloney     dilator as described above. 2. Small hiatal hernia. 3. Otherwise normal stomach, D1 and D2.  I suspect the patient has underlying esophageal motility disorder. Polypharmacy makes management quite challenging.  RECOMMENDATIONS: 1. Continue Aciphex 20 mg orally empirically for GERD. 2. She has recurrent esophageal dysphagia symptoms.  I recommend she     be referred to a [facility] for further     evaluation/management.     Jonathon Bellows, M.D.     RMR/MEDQ  D:  07/21/2010  T:  07/21/2010  Job:  [id number]  cc:   Milus Mallick. Lodema Hong, M.D. Fax: [phone]  Electronically Signed by Lorrin Goodell M.D. on 07/22/2010 08:20:45 AM

## 2010-07-22 NOTE — Telephone Encounter (Signed)
Patient states she takes 10units at bedtime and sugars have been better

## 2010-07-25 ENCOUNTER — Ambulatory Visit (HOSPITAL_COMMUNITY)
Admit: 2010-07-25 | Discharge: 2010-07-25 | Disposition: A | Discharge status: Home / Self Care | Payer: Medicare Other | Source: Ambulatory Visit | Attending: Orthopedic Surgery | Admitting: Orthopedic Surgery

## 2010-07-25 DIAGNOSIS — M545 Low back pain, unspecified: Secondary | ICD-10-CM | POA: Insufficient documentation

## 2010-07-25 DIAGNOSIS — M5137 Other intervertebral disc degeneration, lumbosacral region: Secondary | ICD-10-CM | POA: Insufficient documentation

## 2010-07-25 DIAGNOSIS — M5126 Other intervertebral disc displacement, lumbar region: Secondary | ICD-10-CM | POA: Insufficient documentation

## 2010-07-25 DIAGNOSIS — M79609 Pain in unspecified limb: Secondary | ICD-10-CM | POA: Insufficient documentation

## 2010-07-25 DIAGNOSIS — M51379 Other intervertebral disc degeneration, lumbosacral region without mention of lumbar back pain or lower extremity pain: Secondary | ICD-10-CM | POA: Insufficient documentation

## 2010-07-26 NOTE — Assessment & Plan Note (Signed)
NAME:  [name], [name]                   CHART#:  [id number]   DATE:  12/06/2007                       DOB:  [birth year]   FOLLOWUP:  Dysphagia.  I saw her on 11/19/2007 for persisting symptoms  of what sound more like esophageal dysphagia.  She had a barium swallow,  which demonstrated no impediment and passed a barium pill.  EGD revealed  small hiatal hernia, otherwise the esophagus, stomach, D1 and D2  appeared normal.  I passed a 54-French Maloney dilator empirically.  She  states this really has made too much difference in swallowing.  She  tells me she continues to get checked pointing to the base of her neck.  She does have a thyroid nodule, has been worked up by Dr. Patrecia Pace.  She  has had a biopsy.  She has gained 4 pounds since she was last seen.  She  had called in and complained of increasing phlegm and we discontinued  the Zofran and increased her omeprazole 20 mg twice daily.  She has been  on Phenergan.  It is not clear to be if she is still taking that agent  or not.  Dr. Domingo Sep seen her and slated her for an echo and a renal  ultrasound next month.   She has a distant history of pancreatitis with a negative endoscopic  ultrasound in 2007 by Dr. Christella Hartigan down in [place] and negative  colonoscopy by Dr. Loreta Ave back in 2007.  She has a history of CVAs and  TIAs.   CURRENT MEDICATIONS:  See updated list.  The medication list to be  confirmed and reconciled with Dr. Anthony Sar records.   ALLERGIES:  Keflex, penicillin, and Pyridium.   PHYSICAL EXAMINATION:  GENERAL:  Today, she appears at baseline.  VITAL SIGNS:  Weight 159, up 4 pounds, height 5 feet 2 inches,  temperature 98.3, BP 110/80, and pulse 80.  SKIN:  Warm and dry.  HEENT:  No scleral icterus.  NECK:  I do not feel any significant mass or organomegaly in her neck.  CHEST:  Lungs are clear to auscultation.  CARDIAC:  Regular rate and rhythm without murmur, gallop, or rub.  ABDOMEN:  Obese, positive bowel sounds,  soft, and nontender without  obvious mass or organomegaly.   ASSESSMENT:  Persisting dysphagia symptoms.  She does complain of  increased phlegm in the back of her throat.  Findings of recent barium  pill esophagram and EGD are reassuring.  She does seem to have  difficulty initiating swallowing and she may have an element of  oropharyngeal dysphagia secondary to prior neurological insult with a  stroke.  Increased phlegm is nonspecific and could easily be relating to  number of things including sinus drainage.  Reflux symptoms appeared to  be squashed on omeprazole 20 mg orally twice daily.   RECOMMENDATIONS:  We will go ahead and do a speech therapy and  swallowing evaluation to see where we stand.  Continue omeprazole 20 mg  orally twice daily.  We will review medication list from the rest home  when they become available.  Further recommendations to follow.       Jonathon Bellows, M.D.  Electronically Signed     RMR/MEDQ  D:  12/06/2007  T:  12/06/2007  Job:  [id number]   cc:  Milus Mallick. Lodema Hong, M.D.

## 2010-07-26 NOTE — Op Note (Signed)
NAME:  Pelzer, Ciena                  ACCOUNT NO.:  [id number]   MEDICAL RECORD NO.:  [id number]          PATIENT TYPE:  OBV   LOCATION:  A323                          FACILITY:  [facility]   PHYSICIAN:  Kassie Mends, M.D.      DATE OF BIRTH:  14-Jan-1951   DATE OF PROCEDURE:  07/17/2008  DATE OF DISCHARGE:                               OPERATIVE REPORT   PRIMARY PHYSICIAN:  Milus Mallick. Lodema Hong, M.D.   PROCEDURE:  Esophagogastroduodenoscopy with cold forceps biopsy.   INDICATION FOR EXAM:  [name] is a 60 year old female who presents with  severe epigastric pain.  She had a CT scan and lab evaluation which  showed no acute intra-abdominal pathology.   FINDINGS:  1. Normal esophagus without evidence of Barrett's, mass, erosion,      ulceration or stricture.  2. Small hiatal hernia.  Diffuse erythema of the body and the antrum      without erosion or ulceration.  Biopsies obtained via cold forceps      to evaluate for Helicobacter pylori gastritis.  3. Normal duodenal bulb and second portion of the duodenum with      moderate bile staining.   DIAGNOSES:  Moderate gastritis likely etiology for her abdominal pain.   RECOMMENDATIONS:  1. Low-fat soft mechanical medium carbohydrate diet.  2. She may restart her Glucotrol 30 minutes prior to her first meal      and then resume p.o. q. 0800 hours.  3. Hold Lovenox.  No aspirin or anti-inflammatory drugs for 30 days.      She will have bilateral lower extremity compression devices to      prevent DVT prophylaxis.  4. Increase her omeprazole as an outpatient to b.i.d.  Will add      Carafate slurry 1 gram p.o. q.i.d.  5. Hold her Plavix.  May restart her Plavix on Jul 21, 2008.  The plan      of care was discussed with her sister-in-law, Gerald Dexter.   MEDICATIONS:  1. Demerol 75 mg IV.  2. Versed 4 mg IV.  3. Phenergan 12.5 mg IV.   PROCEDURE TECHNIQUE:  Physical exam was performed.  Informed consent was  obtained from the patient after  explaining the benefits, risks and  alternatives to the procedure.  The patient was connected to the monitor  and placed in the left lateral position.  Continuous oxygen was provided  by nasal cannula and IV medicine administered through an indwelling  cannula.  After administration of sedation, the patient's esophagus was  intubated and the scope was advanced under direct visualization to the  second portion of the duodenum.  The scope was removed  slowly by carefully examining the color, texture, anatomy and integrity  of the mucosa on the way out.  The patient was recovered in endoscopy  and discharged to the floor in satisfactory condition.   PATH:  Reactive gastropathy      Kassie Mends, M.D.  Electronically Signed     SM/MEDQ  D:  07/17/2008  T:  07/17/2008  Job:  [id number]   cc:  Norwood Levo. Moshe Cipro, M.D.  Fax: [phone]

## 2010-07-26 NOTE — Consult Note (Signed)
NAME:  [name], [name]                  ACCOUNT NO.:  [id number]   MEDICAL RECORD NO.:  [id number]          PATIENT TYPE:  INP   LOCATION:  A323                          FACILITY:  [facility]   PHYSICIAN:  Kofi A. Gerilyn Pilgrim, M.D. DATE OF BIRTH:  [birth year]   DATE OF CONSULTATION:  DATE OF DISCHARGE:                                 CONSULTATION   REASON FOR CONSULTATION:  Headaches.   This is a 60 year old black female who admitted to the hospital because  of chest pain.  The patient currently lives in an assisted living  facility and reports having tightening of the muscles all over it with  chest discomfort.  She was therefore taken to the emergency room  for  dizziness and lightheadedness.  The patient has a rather complicated  history.  However, she reports having a longstanding history of  headaches since 60 years old.  She also reports having significant  nausea and some emesis.  She more recently, over the last week or so,  developed significant low back pain.  She has had some spells of what  she describes as confusion, disorientation, and slurred speech.  They  occur episodically.  The patient was seen in Inpatient by myself in  2008, for spell of unresponsiveness.  It was felt that she may have had  unwitnessed seizure.  EEG was ordered, although, I do not see the  results on the computer.  She reports getting it, however.  She is being  seen by Dr. Ninetta Lights an [facility] neurologist for headaches.  The patient has  had spells of falling asleep while standing.  She has also had multiple  falling spells.  She was on Topamax for headache prophylaxis and also  isosorbide.  I suspect that these were discontinued possibly because of  her falling.  The patient indicates that she has had longstanding  history of bipolar disorder and severe depression, which required  electroconvulsive therapy in the past.  She also complains of muscle  spasms involving her legs, calves, and other places.  She reports  a lot  of problems with difficulty sleeping and that is why she is on  medications for this.  In addition, she reports neck pain and spasms in  her neck.  Most times, her head hurts in the front part/frontal areas.   PAST MEDICAL HISTORY:  Again, bipolar disorder.  She has a history of  severe depression in the past requiring electroconvulsive therapy, type  2 diabetes, hypertension, chest pain, chronic headache, sleep apnea, and  recurrent stroke in the past.   ADMISSION MEDICATIONS:  1. Plavix 75 mg daily.  2. Lovastatin 8 mg daily.  3. Singulair 10 mg daily.  4. Zoloft 200 mg at bedtime.  5. Omeprazole 10 mg daily.  6. Gabapentin 300 mg t.i.d.  7. Lisinopril 10 mg b.i.d.  8. Calcium with vitamin D t.i.d.  9. Metoprolol 12.5 mg.  10.Darvocet p.r.n. b.i.d.  11.Avandia 4 mg once a day.  12.Boniva 150 mg daily.  13.Flexeril 10 mg at bedtime.  14.Ambien 10 mg at bedtime.  15.Clonidine 0.1 at  bedtime.  16.Ativan 1 mg b.i.d., and also at bedtime.  17.Seroquel 50 mg at bedtime.  18.Phenergan p.r.n.  19.Fentanyl patch 25 mcg every 3 days.  20.Evoxac 30 mg once a day.   ALLERGIES:  KEFLEX, PENICILLIN, and PYRIDIUM.   REVIEW OF SYSTEMS:  See the history of present illness.  She also  reports that she was taken off Neurontin and this medication was given  by her psychiatrist.  It appears that the medication does have modest  relief with pain control.  She was also taken off the fentanyl patch in  the past, but has restarted this.   PHYSICAL EXAMINATION:  GENERAL:  A mildly overweight lady in no acute  distress.  VITAL SIGNS:  Temperature 98.0, pulse 76, respirations 18, and blood  pressure 88/54.  HEENT:  Neck is supple.  Head is normocephalic and atraumatic.  ABDOMEN:  Obese, but soft.  EXTREMITIES:  No significant edema or varicosities.  MENTATION:  The patient is awake and alert.  She converses well.  She is  currently lucid and coherent.  No language impairment is observed.   No  dysarthria.  She follows commands well.  CRANIAL NERVE:  Pupils are equal, round, and reactive.  Visual fields  are intact.  Extraocular movements are full.  Face and muscle strength  is symmetric.  Tongue is midline.  Uvula midline.  Shoulder shrug is  normal.  Motor examination shows normal tone, bulk, and strength.  There  was no pronator drift.  Coordination, she has some evidence of dysmetria  involving the left upper extremity.  Heel-to-shin is normal.  However,  no tremors, no past-pointing, rapid alternating movement of the hand  seems fair.  Sensation is normal to light touch and temperature  bilaterally.  Reflexes are pathologically brisk in the legs, although  plantars are both downgoing.  Reflexes are normal to brisk in the upper  extremities.   LABORATORY DATA:  WBC 7.0, hemoglobin 9.3, platelet count of 260.  Sodium 127, potassium 4.8, chloride 102, CO2 of 31, BUN 30, creatinine  0.9, glucose 113, calcium 9.1, and TSH 1.0.   ASSESSMENT:  This is a difficult case to sort out.  The patient seemed  to have multiple symptoms.  She clearly has significant headache  syndrome, which is longstanding and episodic.  She also has chronic pain  problems with low back pain and spasms.  Some symptoms including gait  problems and hypersomnia possibly could be due to medication effect  especially since she is on numerous medications.  I am highly suspicious  that the episodic spells of disorientation, confusion, slurred speech,  likely represent complex partial seizures.   RECOMMENDATIONS:  I think we should make an attempt to simplify her  medication, but this is going to be a hard.  It may be actually safe to  use long-acting opioids for pain control rather short-acting opioids and  other psychotropic medications, which increase her risk of side effects  and drug-to-drug interaction.  We may consider increasing the patches  for better pain relief in the future.  I am going to go  ahead and  discontinue the Darvocet as this is known to cause problems in elderly  people.  I think we should treat her for these spell of unresponsive as  I think she has complex partial seizure, therefore I am going to start  her on Lamictal and also increase her Neurontin, which may help with  spasms.  It would be good if we  can reduce some of her other  psychotropic medications such as Seroquel or discontinue this if  possible.  Probably, this can be done at consultation with her  psychiatrist.  She did  have an MRI apparently done at [facility], which apparently showed some  type of tumor, we will try to obtain this.  I think we should repeat her  EEG and additional labs including vitamin B12 level with thyroid  function tests, homocysteine level, and vitamin D level.      Kofi A. Gerilyn Pilgrim, M.D.  Electronically Signed     KAD/MEDQ  D:  02/04/2008  T:  02/05/2008  Job:  [id number]

## 2010-07-26 NOTE — Discharge Summary (Signed)
NAME:  [name], [name]                  ACCOUNT NO.:  [id number]   MEDICAL RECORD NO.:  [id number]          PATIENT TYPE:  INP   LOCATION:  A309                          FACILITY:  [facility]   PHYSICIAN:  Osvaldo Shipper, MD     DATE OF BIRTH:  1950/11/16   DATE OF ADMISSION:  08/24/2007  DATE OF DISCHARGE:  06/17/2009LH                               DISCHARGE SUMMARY   PRIMARY CARE PHYSICIAN:  Milus Mallick. Lodema Hong, M.D.  The patient was seen  by gastroenterologist here during this admission.   DISCHARGE DIAGNOSES:  1. Chronic abdominal pain and nausea secondary to irritable bowel      syndrome.  2. History of psychiatric disease with bipolar disorder, adjustment      disorder.  3. Chronic diarrhea.  4. History of diabetes and hypertension.   BRIEF HOSPITAL COURSE:  Briefly, this is a 60 year old African American  female who presented to the hospital with complaints of shortness of  breath, tightness in the chest, abdominal pain, nausea and vomiting.  The patient is well-known to our service for numerous admissions for  similar complaints.  The patient was admitted to the hospital for  further evaluation.  She underwent EKG which did not show any  significant abnormalities.  She ruled out for acute coronary syndrome.  D-dimer was mildly elevated.  She underwent a CAT scan of the chest  which did not show any PE.  She underwent CT scan of the abdomen and  pelvis which did not reveal any concerning abnormalities.  The only  thing positive on the studies was a thyroid nodule for which a thyroid  ultrasound was recommended by the radiologist.  The patient was seen by  GI who felt that all of her symptoms were secondary to irritable bowel  syndrome.  She was put on Cipro and Flagyl empirically.  She continues  to complain of abdominal pain and nausea.  She is tolerating p.o. intake  quite well.  So no further evaluation for her abdominal pain needs to be  done.  GI had pretty much signed off on  her.  The Cipro and Flagyl have  been changed over to p.o.  So she also is okay for placement.  She will  be going to [facility].  She requires nursing home  placement now.   However, this morning she started complaining of suicidal ideation.  She  mentioned that she wants to take her life because of all these medical  issues that she has to deal with.  She does have a significant history  of psych disorders including bipolar disorder.  She has been seen by our  ACT team multiple times in the past.  So what I am going to have to do  is have her seen by ACT team and if they clear her she can be  discharged.  Medically she is stable but she was a little bit  hypokalemic yesterday.  No labs have been checked today but I will go  ahead and supplement some potassium anyways.  So on the day of discharge  except for the above mentioned complaints she also has some nausea but  otherwise no other complaints.  Her vital signs are stable.  Abdomen  continues to be benign.  CBGs are within reasonable limits.   So she is stable for discharge provided ACT team clears her.   DISCHARGE MEDICATIONS:  1. Cipro 500 mg b.i.d. for 5 days.  2. Flagyl 250 mg t.i.d. with meals for 5 days.  3. She can have Ambien 10 mg daily at bedtime.  4. Zetia 10 mg daily.  5. Phenergan 25 mg every 4-6 hours orally as needed.  6. Gabapentin 300 mg t.i.d.  7. Questran 4 grams packet to be taken on a daily basis.  Other      medications to be given either 1 hour before or 4 hours after      Questran is given.  8. Metoprolol 12.5 mg p.o. b.i.d.  9. Ativan 1 mg 4 times a day.  10.Sertraline 200 mg once a day.  11.Prilosec 20 mg once a day.  12.Plavix 75 mg once a day.  13.Clonidine 0.1 mg at bedtime.  14.Lisinopril 10 mg daily.  15.Singulair 10 mg daily.  16.Topamax 25 mg b.i.d.  17.Levsin 0.25 mg sublingually before meals and at bedtime.   OTHER RECOMMENDATIONS:  1. Please check her CBG at least  once a day.  She is on no      antidiabetic medications at this time.  2. She will need a thyroid ultrasound which will be scheduled for her      prior to discharge for a thyroid nodule.  3. Follow up with Dr. Lodema Hong in 1-2 weeks.  4. Physical activity as before.  5. Diet.  She can have modified carbohydrate diet, low residue.   Total time of discharge 40 minutes.   So the patient can go to the nursing home once she is cleared by the ACT  team.      Osvaldo Shipper, MD  Electronically Signed     GK/MEDQ  D:  08/28/2007  T:  08/28/2007  Job:  [id number]   cc:   Milus Mallick. Lodema Hong, M.D.  Fax: [phone]

## 2010-07-26 NOTE — H&P (Signed)
NAME:  [name], [name]                  ACCOUNT NO.:  [id number]   MEDICAL RECORD NO.:  [id number]          PATIENT TYPE:  OBV   LOCATION:  A332                          FACILITY:  [facility]   PHYSICIAN:  Della Goo, M.D. DATE OF BIRTH:  [birth year]   DATE OF ADMISSION:  09/14/2008  DATE OF DISCHARGE:  LH                              HISTORY & PHYSICAL   PRIMARY CARE PHYSICIAN:  Milus Mallick. Lodema Hong, M.D.   CHIEF COMPLAINT:  Decreased responsiveness.   HISTORY OF PRESENT ILLNESS:  This is a 60 year old female resident of an  area assisted living center who was brought to the emergency department  secondary to decreased responsiveness which had been increasing over the  past day.  This is per the patient's primary care giver.  Over the day  as well, the caregiver reports the patient has been unable to support  her weight in walking and in the evening and night the patient had been  less responsive.  Of note, the patient had been placed on a fentanyl  patch one day earlier, and before bedtime, the patient was given Ambien  10 mg as well as two 7.5/500 mg hydrocodone tablets.  Prior to this, the  patient had been at her baseline.   In the emergency department, the patient was evaluated by the EDP, Dr.  Colon Branch, and administered Narcan times one dose and did have temporary  improvement.  The patient was referred for medical  admission/observation.   PAST MEDICAL HISTORY:  1. Previous cerebrovascular accident with left sided weakness.  2. Type 2 diabetes mellitus.  3. Hypertension.  4. Chronic migraine headaches.  5. Bipolar disorder.  6. Sleep apnea.  7. History of hypothyroidism.  8. Chronic pain syndrome.   MEDICATIONS:  1. Benazepril 40 mg one p.o. daily.  2. Aciphex 20 mg one p.o. b.i.d.  3. Singulair 10 mg one p.o. daily.  4. Metoprolol tartrate 12.5 mg one p.o. b.i.d.  5. Clonidine 0.05 mg one p.o. b.i.d.  6. Carafate liquid 1 g p.o. q.i.d.  7. Levoxyl 50 mcg one p.o.  daily.  8. Lovastatin 80 mg one p.o. q.h.s.  9. Dicyclomine 20 mg one p.o. b.i.d.  10.Boniva 150 mg one p.o. monthly.  11.Glipizide 2.5 mg one p.o. q.a.m.  12.Mirtazapine 30 mg p.o. q.h.s.  13.Citrulline 200 mg one p.o. daily.  14.Fentanyl patch 50 mcg one patch q.72 h.  15.Lorazepam 1 mg one p.o. b.i.d.  16.Lorazepam 2 mg p.o. q.h.s.  17.Ambien 10 mg p.o. q.h.s.  18.Fluticasone nasal spray 50 mcg two sprays each nostril once daily.  19.Gabapentin 600 mg one p.o. t.i.d.  20.Nystatin powder b.i.d.  21.Evoxac oral 30 mg one p.o. t.i.d.  22.ProAir HFA inhaler 90 mcg two puffs at bedtime.  23.Lamictal 100 mg one p.o. daily.  24.Plavix 75 mg one p.o. daily.  25.Lisinopril one p.o. daily.  26.Endocet 10/325 one p.o. q.6 h p.r.n. pain.  27.P.r.n. medications:      a.     Hydrocodone/APAP 7.5/500.      b.     Meclizine.      c.  Cyclobenzaprine.      d.     Acetaminophen.      e.     Ondansetron.      f.     Voltaren topically.   ALLERGIES:  PENICILLIN, KEFLEX, PERIDIUM, CASTOR OIL   SOCIAL HISTORY:  The patient resides in an assisted living facility.  She is a nonsmoker, nondrinker, and no history of illicit drug usage.   FAMILY HISTORY:  Positive for coronary artery disease and diabetes.   REVIEW OF SYSTEMS:  Unable to obtain from patient at this time secondary  to her decreased level of consciousness.   PHYSICAL EXAMINATION:  GENERAL:  This is a somnolent 60 year old well-  nourished, well-developed female in no visible discomfort or acute  distress currently.  VITAL SIGNS:  Temperature 98.7, blood pressure initially 93/50, heart  rate 105/46, heart rate 78, respirations 18, O2 saturations initially  88%, now 100% on 2 liters nasal cannula oxygen.  HEENT:  Normocephalic, atraumatic.  Pupils sluggish but reactive to  light bilaterally and symmetric.  Extraocular movements unable to assess  at this time.  Funduscopic are benign.  Nares are patent bilaterally.  Oropharynx is  clear.  NECK:  Supple.  No thyromegaly, adenopathy, jugular venous distention.  CARDIOVASCULAR:  Regular rate and rhythm.  No murmurs, rubs or gallops.  LUNGS:  Clear to auscultation bilaterally.  ABDOMEN:  Positive bowel sounds.  Soft, nontender, nondistended.  EXTREMITIES:  Without cyanosis, clubbing or edema.  NEUROLOGICAL:  The patient is somnolent and arousable temporarily.  Unable to ascertain patient's verbal skills at this time.  The patient  is unable to cooperate secondary to her increased somnolence.   LABORATORY DATA:  White blood cell count 8.7, hemoglobin 8.3, hematocrit  25.7, MCV 71.0, platelets 382, neutrophils 61%, lymphocytes 27%.  Sodium  139, potassium 3.0, chloride 105, CO2 24, BUN 35, creatinine 1.72,  glucose 106.  Alcohol level less than 5.  Urinalysis negative.  Urine  drug screen positive for opiates and positive for benzodiazepines.   ASSESSMENT:  A 60 year old female being admitted with:  1. Altered mental status.  2. Hypotension.  3. Hypokalemia.  4. Type 2 diabetes mellitus.  5. Microcytic anemia.   PLAN:  The patient has been admitted for observation status at this  time.  However, depending on patient's clinical progress, her condition  may be upgraded to a full admission.  The patient has been placed on  telemetry for cardiac monitoring, and continuous pulse oximetry has also  been ordered.  The patient will also be placed on CPAP therapy per  respiratory therapy since she has a diagnosis of obstructive sleep  apnea.  An arterial blood gas will also be performed.  The patient's  regular medications have been held for now secondary to her decreased  responsiveness.  Her medications will be resumed and further adjusted  once she is more alert.  IV fluids have been ordered and potassium  replacement therapy per her IV fluids.  The patient's glucose levels  will also be monitored q.4 h and insulin sliding scale has been ordered  for elevated blood  sugars.  Neurologic checks will also be performed.      Della Goo, M.D.  Electronically Signed     HJ/MEDQ  D:  09/14/2008  T:  09/14/2008  Job:  [id number]   cc:   Milus Mallick. Lodema Hong, M.D.  Fax: [phone]

## 2010-07-26 NOTE — H&P (Signed)
NAME:  [name], [name]                  ACCOUNT NO.:  [id number]   MEDICAL RECORD NO.:  [id number]          PATIENT TYPE:  INP   LOCATION:  IC06                          FACILITY:  [facility]   PHYSICIAN:  Margaretmary Dys, M.D.DATE OF BIRTH:  1950/04/12   DATE OF ADMISSION:  07/05/2008  DATE OF DISCHARGE:  LH                              HISTORY & PHYSICAL   ADMISSION DIAGNOSIS:  1. Altered mental status.  2. History of polypharmacy.  3. Dizziness.   CHIEF COMPLAINTS:  Dizziness.   HISTORY OF PRESENT ILLNESS:  [name] is a 60 year old female who is  well-known to the hospitalist service from multiple admissions in the  past as recently as in February 2010.  The patient has a recurrent  history of altered mental status, chronic headaches and multiple pain  medications for which she takes multiple medicines.  The patient reports  that she is addicted to Ativan but she has continued to take it.  She  did go to see Dr. Thomasena Edis who is her psychiatric who tried to get her  off the Ativan but was not successful.  The patient continues to take  the medication every day.  The patient was found the emergency room to  be a little less responsive than usual.  She was said to have some  slurred speech and actually some back aches.  She had a CT scan of the  head which was negative.  The patient continues to have chronic back  pain which is 8/10, she is requesting that all her medications be  continued.  The patient is very lucid well oriented in time, place and  person, but is somewhat lethargic, which is what she usually is most of  the time anyway.  The patient lives in an assisted living facility.   REVIEW OF SYSTEMS:  As mentioned in history of present illness above.   PAST MEDICAL HISTORY:  1. Sleep apnea on CPAP.  2. Diabetes mellitus type 2.  3. Hypertension.  4. Chronic migraine headaches.  5. History of stroke with left-sided weakness.  6. History of chronic pain syndrome.  7. Bipolar  disorder.  8. Probable schizophrenia.  9. Prior history of cerebrovascular accident.  10.Type 2 diabetes.   MEDICATIONS:  1. The patient is on fluticasone 50 mcg 2 sprays in each nostril once      a day.  2. Fentanyl patch 25 mcg every 3 days transdermally.  3. Lorazepam 1 mg twice a day.  4. Metoprolol 25 mg half a tablet twice a day.  5. Omeprazole 20 mg once a day.  6. Avandia 4 mg once a day.  7. Sertraline 100 mg 2 tablets once a day.  8. Mirtazapine 50 mg at bedtime.  9. Singulair 10 mg once a day.  10.Lisinopril 10 mg once a day.  11.Clonidine 0.2 mg at bedtime.  12.Zetia 10 mg at bedtime.  13.Boniva 150 mg once a month.  14.Lamictal 100 mg once a day  15.Seroquel 50 mg at bedtime.  16.Lovastatin 40 mg 2 tablets at bedtime.  17.Evoxac 30 mg p.o. t.i.d.  18.Dicyclomine 20 mg t.i.d.  19.Plavix 75 mg p.o. once a day.  20.Gabapentin.  21.Nystatin.  22.Darvocet.  23.__________ p.r.n.  24.Meclizine p.r.n.  25.Cyclobenzaprine p.r.n.[name]   ALLERGIES:  1. PENICILLIN.  2. PYRIDIUM.  3. KEFLEX.  4. CASTOR OIL.   SOCIAL HISTORY:  The patient lives in assisted living facility.  Does do  some ambulation around by herself.  She denies any fall recently.  No  history of alcohol abuse.  The patient is disabled.   FAMILY HISTORY:  Is noncontributory.   PHYSICAL EXAMINATION:  GENERAL:  The patient was conscious, although  somewhat lethargic but following commands easily, well oriented in time,  place and person.  She did not really look different from what I have  seen her before.  VITAL SIGNS:  On arrival in intensive care unit blood pressure was  118/62, pulse of 76, respirations 24, temperature 98.4 degrees  Fahrenheit, oxygen saturation was 99% on room air.  HEENT: Exam normocephalic, atraumatic.  Oral mucosa was dry.  No  exudates were noted.  NECK:  Supple.  No JVD or lymphadenopathy.  The patient may have some  tardive dyskinesia.  LUNGS:  Clear clinically.  Good air  entry bilaterally.  HEART:  S1-S2 regular.  No S3, S4, gallops or rubs.  ABDOMEN:  Soft, nontender.  Bowel sounds positive.  No masses palpable.  EXTREMITIES:  No edema.  CNS:  The patient was awake, following commands although somewhat  lethargic.   LABORATORY DATA:  White blood count 7.9, hemoglobin of 10, hematocrit  30.7, MCV was 73, platelet count was 203, blood glucose was 158.  Sodium  was 138, potassium 4.5, chloride of 108, CO2 is 25, BUN of 29,  creatinine 1.11.  Liver function test was normal, ammonia level was 39.  Urine drug screen was positive for benzodiazepines and opiates for which  the patient is on.   CT scan of the head was negative for any acute intracranial  abnormalities.  Chest x-ray shows some mild cardiac enlargement and no  infiltrates, edema or effusions was seen.   ASSESSMENT/PLAN:  This is a 60 year old female who is well-known to the  hospitalist service from multiple admissions in the past.  The patient  presents to the emergency room today again with similar symptoms of  dizziness, lightheadedness and altered mental status.  This is likely  due to polypharmacy.  The patient usually does present like this and  eventually after a couple of the medications are held she is wide awake  and much more alert in the morning.  Will try to do this but the patient  has informed me that she needs to go back on all her medications.  She  has had difficulties when these have been held in the past.   We will watch her in the intensive care unit closely.  I do not see any  other cause for altered mental status especially acute infectious  process.  The patient's initial CT scan was negative and exam is not  really suggestive of acute stroke.   Overall the patient remains stable and possibly will maybe discharge her  back to the assisted living facility in the next 48 hours.      Margaretmary Dys, M.D.  Electronically Signed     AM/MEDQ  D:  07/05/2008  T:   07/06/2008  Job:  [id number]

## 2010-07-26 NOTE — Discharge Summary (Signed)
NAME:  [name], [name]                  ACCOUNT NO.:  [id number]   MEDICAL RECORD NO.:  [id number]          PATIENT TYPE:  INP   LOCATION:  A323                          FACILITY:  [facility]   PHYSICIAN:  Skeet Latch, DO    DATE OF BIRTH:  [birth year]   DATE OF ADMISSION:  02/02/2008  DATE OF DISCHARGE:  LH                               DISCHARGE SUMMARY   PRIMARY CARE PHYSICIAN:  Dr. Lodema Hong.   CARDIOLOGIST:  Dr. Domingo Sep.   NEUROLOGIST:  Dr. __________ .   ANTICIPATED DATE OF DISCHARGE:  February 07, 2008.   DISCHARGE DIAGNOSES:  1. Chronic headaches.  2. Altered mental status.  3. Chest pain, resolved.  4. History of type 2 diabetes.  5. History of bipolar disorder.  6. History of hypertension.  7. History of obstructive sleep apnea.   BRIEF HOSPITAL COURSE:  This is a 60 year old African American female  who is well-known to our service from previous admissions who lives at  assisted living facility who presented after going to church.  Got out  of her car, started feeling chest tightness in muscles all over body.  The patient states that her chest tightness worsened, became  aggressively worse, stated 10/10 in intensity.  She states that she felt  some fluttering/palpitations in her heart.  The patient sat down in  church.  Got dizzy and lightheaded.  Also admitted to a lot of  flatulence.  The patient admits to some nausea, no vomiting, no  abdominal pain.  The patient states she felt some chills, no cough or  fever.  Symptoms did not improve.  Continued to have some minimal chest  tightness.  Decided to come to the emergency room to be evaluated.  Initial labs showed hemoglobin 9.6, MCV 72, platelet count 270.  Sodium  134, glucose 138.  Cardiac panel was unremarkable.  Chest x-ray showed  some left basilar atelectasis, stable borderline cardiomegaly with  minimal chronic bronchitic changes.  First EKG showed sinus rhythm.  Subsequent EKG showed sinus rhythm with normal  axis, no Q waves or ST  changes.  The patient was admitted for chest pain.  Cardiology was  consulted.  The patient was continued on her Plavix as well as her beta  blocker.  Cardiology felt that patient may have an elective outpatient  stress test to further evaluate, but had no other suggestions.  Secondary to patient's chronic migraine headaches neurology was  consulted, and patient's medications have been adjusted.  The patient  stated that she did have an MRI done by her other neurologist in [place].  Do not have the results of this at this time.  The patient was placed on  all her other home medications for her diabetes, bipolar disorder, and  she was placed on CPAP for her sleep apnea.  Neurology saw the patient.  Tried to simplify her medications.  It was felt these long-acting  opiates for pain control rather than short-acting.  The patient was  placed on a Fentanyl patch, and her Darvocet was discontinued.  There  was wondering if patient was  having complex partial seizures.  The  patient was started on Lamictal, and her Neurontin was increased.  It  was recommended that patient probably have a consultation with her  psychiatrist also.  The patient had a vitamin B12, homocysteine, vitamin  B level performed which were fairly unremarkable.  The patient has not  had any seizure-type activities since she has been at [facility].  The patient had a chest x-ray that showed mild left basilar  atelectasis with some cardiomegaly and minimal chronic bronchitic  changes.  The patient's EEG showed some mild generalized slowing, and no  epileptiform activity was observed.  At this time anticipate patient  being discharged in the next 24 hours back to assisted living facility.   ANTICIPATED MEDICATIONS ON DISCHARGE:  1. Calcium plus vitamin D 3 times a day.  2. Catapres 0.1 mg q.h.s.  3. Plavix 75 mg daily.  4. Flexeril 10 mg q.h.s.  5. Bentyl 20 mg 3 times a day.  6. Fentanyl  patch 25 mcg q.72 h.  7. Neurontin 600 mg 3 times a day.  8. Lamictal 25 mg daily.  9. Lisinopril 10 mg daily.  10.Ativan 1 mg twice a day.  11.Lopressor 12.5 mg twice a day.  12.Singulair 10 mg daily.  13.Lovastatin 80 mg daily.  14.Seroquel 50 mg q.h.s.  15.Avandia 4 mg q.h.s.  16.Zoloft 200 mg q.h.s.  17.Boniva 1 tab monthly.  18.Isosorbide mononitrate 30 mg twice a day.  19.Prednisone 5 mg daily.   CONDITION AT DISCHARGE:  Stable.   DISPOSITION:  The patient will be discharged back to assisted living  facility.   DISCHARGE INSTRUCTIONS:  The patient is to follow up with Dr. Lodema Hong  within 1 week.  Also follow up with Dr. Gerilyn Pilgrim or previous neurologist  in 1 week.  The patient to take her medications as described.  She is to  be on a low-salt, heart-healthy diet.  Increase her activity slowly.  The patient to return to the emergency room if she had any increasing  dyspnea or chest pain or call 911.  Copy of MRI for Maryruth Bun will be  sent to Dr. Ronal Fear office.      Skeet Latch, DO  Electronically Signed     SM/MEDQ  D:  02/06/2008  T:  02/06/2008  Job:  [id number]   cc:   Darleen Crocker A. Gerilyn Pilgrim, M.D.  Fax: [phone]   Lodema Hong, M.D.

## 2010-07-26 NOTE — Discharge Summary (Signed)
NAME:  [name], [name]                  ACCOUNT NO.:  [id number]   MEDICAL RECORD NO.:  [id number]          PATIENT TYPE:  INP   LOCATION:  A318                          FACILITY:  [facility]   PHYSICIAN:  Skeet Latch, DO    DATE OF BIRTH:  12-15-1950   DATE OF ADMISSION:  07/05/2008  DATE OF DISCHARGE:  04/27/2010LH                               DISCHARGE SUMMARY   DISCHARGE DIAGNOSES:  1. Altered mental status, improved.  2. History of chronic headache.  3. History of type 2 diabetes.  4. History of bipolar disorder.  5. History of hypertension.  6. History of obstructive sleep apnea.  7. History of cerebrovascular accident with left-sided weakness.  8. History of chronic pain syndrome.  9. History of probable schizophrenia.   PRIMARY CARE PHYSICIAN:  Dr. Syliva Overman.   CARDIOLOGIST:  Dr. Domingo Sep.   NEUROLOGIST:  Dr. Gerilyn Pilgrim.   BRIEF HOSPITAL COURSE:  [name] is a 60 year old African American  female well-known to the Hospitalist service with multiple admissions,  the last being in February 2010.  She had a history of mental status  changes, chronic pain with multiple medications.  The patient presented  to the emergency room with mental status changes.  She reported she  takes Ativan, continues to take it.  She did see a psychiatrist who has  tried to wean her off the Ativan but it has not been successful.  The  patient was found in the emergency room to be a little less responsive  than usual.  She has some slurred speech and complaining of back aches.  The patient had a CT scan of her head which was unremarkable.  The  patient has continued to complain of back pain.  The patient was  admitted with mental status changes probably secondary to polypharmacy.  She was placed on most of her medications, and her mental status has  returned pretty much to baseline at this time.  At this time we feel  patient can be sent back to assisted facility.  The patient's  medications  adjusted but feel that it probably will be very difficult  secondary to the amount of medications that the patient currently takes.  Initially the patient was placed in ICU for close followup due to her  mental status changes.  She was transferred to the floor in stable  condition.   PHYSICAL EXAMINATION:  CURRENT VITAL SIGNS:  Temperature is 98.3, pulse  72, respirations 16, blood pressure 135/69, satting 99% on room air.   LABS:  Ammonia was 23.  Sodium 142, potassium 4.2, chloride 111, CO2 26,  glucose 145, BUN 11 creatinine 0.68.  White count 4.7, hemoglobin 9.0,  hematocrit 28.1, platelet count 375,000.  Chest x-ray showed 1) mild  cardiac enlargement and vascular congestion, 2) no infiltrates, edema or  effusions.  CT of her head without contrast showed no acute intracranial  abnormality.   DISCHARGE MEDICATIONS:  1. Lorazepam 1 mg twice a day at bedtime p.r.n.  2. Metoprolol 25 mg 1/2 tablet twice a day.  3. Omeprazole 20 mg daily.  4. Ear wax removal drops as needed.  5. Singulair 10 mg daily.  6. Gabapentin 600 mg 3 times a day.  7. ProAir inhaler 2 puffs at bedtime.  8. Glipizide XL 2.5 mg daily.  9. Lamictal 100 mg daily.  10.Plavix 75 mg daily.  11.Lisinopril 10 mg daily.  12.Lovastatin 40 mg 2 tablets at bedtime.  13.Fentanyl 50 mcg per hour patch every 72 hours.  14.Zofran 4 mg as needed for nausea.  15.Mirtazapine 50 mg at bedtime.  16.Flexeril 5 mg q.8 h. p.r.n.  17.Tylenol 325 mg 1 tablet 3 times a day as needed.  18.Bentyl 20 mg 3 times a day.  19.Clonidine 0.1 mg one-half tabs twice a day.  20.Boniva 150 mg every month.  21.Ambien 10 mg at bedtime.  22.Hydrocodone/APAP 7.5/500 mg daily as needed.  23.__________  mixture 1 teaspoon 4-5 times a day.  24.Meclizine 12.5 mg 3 times a day.   CONDITION ON DISCHARGE:  Stable.   DISPOSITION:  The patient will be discharged back to assisted living  facility.   DISCHARGE INSTRUCTIONS:  Follow up with Dr. Lodema Hong  within a week and  take her medications as prescribed.  As mentioned, this is going to be a  difficult case.  The patient is on multiple medications.  Unsure exactly  which medication causes the patient to have mental status changes.  She  will continue to follow up closely with her primary care physician and  other specialists.  She is to increase her activity as previous.  She is  to come to the emergency room if she has anymore mental status changes  and/or call 9-1-1.      Skeet Latch, DO  Electronically Signed     SM/MEDQ  D:  07/07/2008  T:  07/07/2008  Job:  [id number]   cc:   Milus Mallick. Lodema Hong, M.D.  Fax: [phone]

## 2010-07-26 NOTE — H&P (Signed)
NAME:  [name], [name]                  ACCOUNT NO.:  [id number]   MEDICAL RECORD NO.:  [id number]         PATIENT TYPE:  PAMB   LOCATION:  DAY                           FACILITY:  [facility]   PHYSICIAN:  Tilford Pillar, MD      DATE OF BIRTH:  1950/05/25   DATE OF ADMISSION:  DATE OF DISCHARGE:  LH                              HISTORY & PHYSICAL   CHIEF COMPLAINT:  Knot on leg.   HISTORY OF PRESENT ILLNESS:  The patient is a 60 year old female with  multiple medical problems including atherosclerotic vascular disease,  sleep apnea, diabetes mellitus type 2, history of osteoarthritis who  presents from Dr. Anthony Sar office with a history of a nodule on her  right thigh.  She states that this has been present for several years,  but has noted to have increased in size and has become uncomfortable and  painful with palpation in lying on this side.  She states that it had  been red.  She was started on antibiotics recently by Dr. Lodema Hong and  she additionally has a smaller nodule, which she describes is a size of  a dime just above her right knee as well as a nodule on the left thigh.  She states that these are all slowly increased in size, and the patient  has concerns considering her family history of cancer.  She does have  pain with palpation in these areas lying on various positions.   PAST MEDICAL HISTORY:  Multiple and reviewed with the patient,  atherosclerotic vascular disease, sleep apnea, and diabetes mellitus 2,  osteoarthritis.   PAST SURGICAL HISTORY:  Cataract surgery, hysterectomy, cholecystectomy,  breast cyst excision, breast reduction surgery, cesarean section.  The  patient describes melanoma on her left hand, which has been excised.   Medications are extensive and again reviewed with the patient including  Plavix, doxycycline, sertraline, Seroquel,  dicyclomine, gabapentin,  Lasix, Avandia, lisinopril, metoprolol, meclizine,cyclobenzaprine,  omeprazole, Singulair,  Zetia.   SOCIAL HISTORY:  The patient denies any tobacco use or alcohol use.  She  lives in a group home.  Occupation, the patient is disabled.  She has  had 6 prior pregnancies.   PERTINENT FAMILY HISTORY:  Cancer and diabetes mellitus.   REVIEW OF SYSTEMS:  CONSTITUTIONAL:  Occasional fevers, chills, and  headaches.  EYES:  Diplopia.  RESPIRATORY:  Occasional wheezing.  EARS,  NOSE, and THROAT:  Unremarkable.  CARDIOVASCULAR:  Unremarkable.  GASTROINTESTINAL:  Abdominal pain and heartburn.  GENITOURINARY:  Unremarkable.  MUSCULOSKELETAL:  Arthralgias in neck, back, and joints.  SKIN: Unremarkable, other than HPI.  Endocrine unremarkable.  NEURO:  Complained of tremors.   PHYSICAL EXAMINATION:  GENERAL:  The patient is obese, disheveled  appearing female.  Her mannerisms are somewhat slow, but she is alert  and expresses understanding of our discussion.  HEENT: Scalp no deformities, no masses.  Eyes, pupils equal, round, and  reactive.  Extraocular movements are intact.  No conjunctival pallor is  noted.  Oral mucosa is pink.  Normal occlusion.  NECK:  Trachea is midline.  She does have  a somewhat prominent thyroid .  No true  goiter, nodularity is apparent.  No cervical lymphadenopathy.  PULMONARY:  Unlabored respiration.  No wheezes.  She is clear to  auscultation bilaterally.  CARDIOVASCULAR:  Regular rate and rhythm.  She has 2+ radial pulses  bilaterally.  ABDOMEN:  Positive bowel sounds.  Abdomen is soft, nontender.  She does  not have hernia masses.  SKIN:  Warm and dry on her right lateral side just posterior to the  tensor fascia lata.  There is a palpable soft tissue mass approximately  3 cm in its greatest dimension.  This is mobile, mildly tender, does not  appear to have any underlying structures.  It is relatively symmetrical  and soft in its texture.  Similarly, she has approximately 1.5 cm nodule  just cranial to her knee on the right side.  This is similarly   symmetrically mobile, nontender, and also has a similar lesion on the  left lateral thigh.   ASSESSMENT AND PLAN:  Lipoma.  At this time I suspect that all 3  adhesions are actually lipomas, although the right lateral thigh lesion  may actually be a infected sebaceous cyst, which has responded to  antibiotics.  However, it could also be related to a lipoma and  resolving hematoma as the patient did discuss possibly having a  traumatic injury to this area upon leaving the shower one morning.  She  states that her symptoms have improved.  I did discuss with the patient,  both conservative, as well as surgical management for the suspected  lipomas.  I do not have any suspicion that these are malignant lesions  at this time.  I did reassure the patient, however due to her family  history, the patient clearly shows expressed concern in regards to  continue observation and I did discuss excision as an alternative.  Risks, benefits, alternatives of excision were discussed at length with  the patient including bleeding, infection, recurrence, as well as the  possibility of intraoperative cardiac and pulmonary events.  Due to the  patient's history, I did discuss the patient likely a spinal versus  local anesthesia to address these versus a general anesthetic, which  obviously the patient has significant risk factors for.  At this time,  we will plan to intervene at the patient's earliest convenience.      Tilford Pillar, MD  Electronically Signed     BZ/MEDQ  D:  04/23/2008  T:  04/24/2008  Job:  [id number]   cc:   Milus Mallick. Lodema Hong, M.D.  Fax: [phone]

## 2010-07-26 NOTE — Group Therapy Note (Signed)
NAME:  [name], [name]                  ACCOUNT NO.:  [id number]   MEDICAL RECORD NO.:  [id number]          PATIENT TYPE:  INP   LOCATION:  A323                          FACILITY:  [facility]   PHYSICIAN:  Kofi A. Gerilyn Pilgrim, M.D. DATE OF BIRTH:  [birth year]   DATE OF PROCEDURE:  DATE OF DISCHARGE:                                 PROGRESS NOTE   Patient still complains of headaches, neck pain that goes into the  shoulder, especially on the right side.  She is up eating today.  She  has tolerated the medication changes so far.  She is awake and alert and  converses well.  She moves all 4 extremities.  Temperature 98.1.  Heart  rate 20.  Blood pressure 111/48.  Sed rate 20.  All the blood tests are  pending.  EEG is also pending.   ASSESSMENT AND PLAN:  1. Headaches and neck pain.  Continue her current medication.  2. Spells of confusion although she has had none since being      hospitalized.  She has been started on Lamictal.  We will continue      with the titration schedule which has been done slowly.  EEG is      pending.  3. Chronic low back pain.  Continue the current medication use and      fentanyl patch, although this can be increased if needed.      Kofi A. Gerilyn Pilgrim, M.D.  Electronically Signed     KAD/MEDQ  D:  02/05/2008  T:  02/05/2008  Job:  [id number]

## 2010-07-26 NOTE — Group Therapy Note (Signed)
NAME:  [name], [name]                  ACCOUNT NO.:  [id number]   MEDICAL RECORD NO.:  [id number]          PATIENT TYPE:  OBV   LOCATION:  A323                          FACILITY:  [facility]   PHYSICIAN:  Dorris Singh, DO    DATE OF BIRTH:  29-Dec-1950   DATE OF PROCEDURE:  DATE OF DISCHARGE:                                 PROGRESS NOTE   The patient seen today after EEG.  She is still complaining of not  feeling well.  She was admitted for chest pain.  There was some concern  of it being pneumonia and there was some concern as to it being cardiac  and cardiology has seen her.  She also had an EEG.  Due to the fact that  she just had a procedure done, we will go ahead and hold her until  tomorrow and then discharge her.  I have explained to her that she will  be discharged from the hospital tomorrow.   PHYSICAL EXAMINATION:  VITAL SIGNS:  Temperature 99, pulse 60,  respirations 18, blood pressure 115/79.  GENERAL:  She is well-developed, well-nourished in no acute stress.  HEART:  Regular rate and rhythm.  LUNGS:  Clear to auscultation bilaterally.  ABDOMEN:  Soft, nontender, nondistended.   LABORATORY DATA:  Today, white count is within normal limits, except for  pertinent positives with a hemoglobin of 9.9 and hematocrit of 31.4.  Her BMET is within normal limits, except for an elevated glucose at 139.   ASSESSMENT/PLAN:  1. Chest pain.  CT angiogram did show pneumonia.  2. Pneumonia.  As mentioned before, we will could go ahead and      continue with her current antibiotic therapy and will keep her for      1 more day and then send her home.  3. Type 2 diabetes.  Continue with current regimen.  4. Chronic history of anemia.  She did have a scope today and      recommendations were given by GI.  We will continue to monitor the      patient and she should be stable enough to go home tomorrow.      Dorris Singh, DO  Electronically Signed     CB/MEDQ  D:  07/17/2008  T:   07/17/2008  Job:  [id number]

## 2010-07-26 NOTE — Assessment & Plan Note (Signed)
NAME:  [name], [name]                   CHART#:  [id number]   DATE:  10/16/2007                       DOB:  24-Mar-1950   PRIMARY CARE PHYSICIAN:  Milus Mallick. Lodema Hong, MD.   PROBLEM LIST:  1. Chronic nausea.  2. Irritable bowel syndrome, chronic diarrhea predominant since 1996.  3. Chronic anemia.  4. Dysphagia.  5. Chronic low back pain.  6. Mild pancreatitis felt to be due to Depakote with a normal EUS by      Dr. Christella Hartigan back in 2006.  7. Multiple cerebrovascular accidents.  8. Hypertension.  9. Bipolar disorder.  10.Anxiety.  11.Diabetes mellitus.  12.Glaucoma.  13.Sleep apnea.  14.Migraine headaches.  15.Carpal tunnel release.  16.Cataract surgery.  17.Status post complete hysterectomy.  18.Status post breast reduction.   SUBJECTIVE:  The patient is a 60 year old female who presents today for  followup hospitalization.  She is complaining of dysphagia and chronic  nausea today.  She does have history of diarrhea predominant and IBS as  well.  She has previously been followed by Dr. Juanda Chance, but now she is  wanting to continue to be seen here in [place].  She is previously a  patient of Dr. Karilyn Cota prior to this as well.  She has inconsistent bowel  movements with severe postprandial urgency.  She can have anywhere from  2 to 3 loose bowel movements at night.  She is complaining of solid food  dysphagia, feels that her food gets stuck and points to her mid  esophagus.  She denies any problems with liquids.  At times, she states  it feels like it cuts off her breath.  She is taking Citrucel, it does  seem to help with the IBS.  She denies rectal bleeding or melena.  She  has had recent labs done through Dr. Anthony Sar office, although she did  not bring those with her today.   CURRENT MEDICATIONS:  The patient did not bring medication list nor did  her caregiver, Franciso Bend.  They are supposed to get those to me as  soon as possible.   ALLERGIES:  Keflex, penicillin, and  Pyridium.   OBJECTIVE:  VITAL SIGNS:  Weight 155 pounds, height 62 inches,  temperature 98.7, blood pressure 112/72, and pulse 80.  GENERAL:  The patient is an alert, oriented, pleasant, and cooperative  [place] female who is in no acute distress.  HEENT:  Sclerae  clear and anicteric.  Conjunctivae pink.  Oropharynx pink and moist  without any lesion.  CHEST:  Heart regular rate and rhythm.  Normal S1 and S2.  ABDOMEN:  Positive bowel sounds x4.  No bruits auscultated.  Soft,  nontender, and nondistended without palpable mass or hepatosplenomegaly.  No rebound tenderness or guarding.  EXTREMITIES:  Without clubbing or edema bilaterally.   ASSESSMENT:  1. Solid food dysphagia.  I suspect she may have esophageal web, ring,      or stricture, but given her chronic medication use and multiple      other medical problems, we would pursue barium pill esophagram.      Initially if this is positive, she is going to need EGD probably to      be done under propofol in the OR.  2. She has a history of chronic microcytic anemia, unsure whether this  is iron deficiency, but this will warrant further workup.  Pending      her most recent labs from Dr. Anthony Sar office.  3. She has chronic nausea possibly related to diabetic gastroparesis.      Her weight is actually up about 4 pounds since when she was      hospitalized.   PLAN:  1. She is requesting antiemetic, however, I cannot prescribe anything      without her complete medication list and she was informed of this      as was her caregiver and we are awaiting this list.  2. Barium pill esophagram.  If positive, she is going to need EGD in      OR with propofol more than likely.  3. We will request most recent lab work from Dr. Anthony Sar office.  If      she remains anemic, she is going to need anemia panel if this has      not already been done.  4. She will need Hemoccult cards x3 if she remains anemic.  5. We will request  most recent EGD and colonoscopy reports from Dr.      Micheline Rough office in [place], [place].       Lorenza Burton, N.P.  Electronically Signed     R. Roetta Sessions, M.D.  Electronically Signed    KJ/MEDQ  D:  10/16/2007  T:  10/17/2007  Job:  [id number]   cc:   Milus Mallick. Lodema Hong, M.D.

## 2010-07-26 NOTE — H&P (Signed)
NAME:  [name], [name]                  ACCOUNT NO.:  [id number]   MEDICAL RECORD NO.:  [id number]          PATIENT TYPE:  OBV   LOCATION:  A337                          FACILITY:  [facility]   PHYSICIAN:  Skeet Latch, DO    DATE OF BIRTH:  1950-06-19   DATE OF ADMISSION:  09/19/2007  DATE OF DISCHARGE:  LH                              HISTORY & PHYSICAL   PRIMARY CARE PHYSICIAN:  Dr. Lodema Hong.   CHIEF COMPLAINT:  Low blood pressure.   HISTORY OF PRESENT ILLNESS:  This is a 60 year old African American  female who presents with some hypotension and some mild diarrhea.  Apparently, the patient lives in a nursing home and, I believe, was  transferred to a rest-type home yesterday.  The patient has been having  some diarrhea on and off.  The patient was found to be hypotensive at  the rest home and they decided to bring her in to the emergency room to  be evaluated.   The patient was discharged from [facility] on August 28, 2007 with a  diagnosis of:  1. Chronic abdominal pain and nausea.  2. Irritable bowel syndrome.  3. Bipolar disorder.  4. Chronic diarrhea.  5. Diabetes.  6. Hypertension.   The patient presents with above complaints with low blood pressure and  some diarrhea which seems to be chronic in nature, but when asked the  patient,  the patient denies any major complaints.   PAST MEDICAL HISTORY:  Significant for a bipolar disorder, diabetes,  hypertension, chronic diarrhea, diverticulitis, glaucoma, obstructive  sleep apnea, chronic migraines.   FAMILY HISTORY:  Noncontributory.   PAST SURGICAL HISTORY:  Cataract surgery, cholecystectomy, hysterectomy,  breast reduction, carpal tunnel release.   SOCIAL HISTORY:  No history of tobacco, alcohol, or illicit drug use.   ALLERGIES:  PENICILLIN, PYRIDIUM, KEFLEX.   CURRENT HOME MEDICATIONS:  Per her discharge summary in June 2009.  1. She is on Ambien 10 mg daily.  2. Zetia 10 mg daily.  3. Phenergan 25 mg every 4-6 hours  as needed.  4. Gabapentin 300 mg t.i.d.  5. Questran 4 grams packet on a daily basis.  6. Metoprolol 12.5 mg twice a day.  7. Ativan 1 mg 4 times daily.  8. Zoloft 20 mg once a day.  9. Prilosec 20 mg daily.  10.Plavix 75 mg once a day.  11.Clonidine 0.1 mg at bedtime.  12.Lisinopril 10 mg daily.  13.Singulair 10 mg daily.  14.Topamax 25 mg b.i.d.  15.Levsin 0.25 mg sublingual before meals and at bedtime.   REVIEW OF SYSTEMS:  GI: Positive for diarrhea that seems to be chronic.  CARDIOVASCULAR:  No chest pain, palpitations.  RESPIRATORY:  No  shortness of breath or dyspnea.  GU: No dysuria, urgency, frequency.  MUSCULOSKELETAL:  No arthralgias or myalgias.   PHYSICAL EXAM:  VITAL SIGNS:  Temperature is 97.7, pulse 88,  respirations 20, blood pressure 120/73.  GENERAL:  She is alert, awake, well-nourished, well-hydrated, well-  developed.  HEENT:  Head is atraumatic, normocephalic.  No scleral icterus.  NECK:  Soft, supple,  nontender, nondistended.  Oral mucosa slightly dry.  CARDIOVASCULAR:  S1, S2, regular rate and rhythm.  No murmurs, rubs or  gallops.  ABDOMEN:  Soft, she has slight tenderness lower left lower quadrant to  deep palpation.  No rigidity or guarding.  LUNGS:  Clear auscultation bilaterally.  No rales, rhonchi or wheezing.  NEUROLOGIC:  The cranial nerves 2-12 are grossly intact.  The patient is  alert and oriented.   LABS:  Troponin 0.03, total bilirubin 0.4, AST is 18, ALT 16,  phosphatase is 101, total protein 7.7, total creatinine kinase 136, CK-  MB is 1.9.  Urinalysis essentially unremarkable white count 8.8,  hemoglobin 9.7, hematocrit to 31.6, platelet count 225,000.  Sodium 135,  potassium 5.1, chloride 105, CO2 is 21, glucose 151, BUN 33, creatinine  3.01.  Chest x-ray shows low lung volumes with bibasilar atelectasis or  early infiltrate.   ASSESSMENT:  1. Hypotension.  2. Acute renal failure.  3. Intravenous hydration.  4. History of type 2  diabetes.  5. History of bipolar disorder.   PLAN:  1. The patient be admitted to observation general medical bed.  2. Hypotension seems to be resolved with IV fluids.  Continue IV fluid      hydration at this time.  Will hold her blood pressure medications      at this time.  3. For acute renal insufficiency, probably secondary to the patient      being dehydrated.  Will continuously IV hydrate the patient.  If      this does not improve may need a renal consult.  4. For diabetes, the patient will be placed on sliding scale blood      sugars checked a.c. and at bedtime.  The patient also will be      placed on her home medications.  5. For bipolar disorder, will place the patient on home medications at      this time.  6. The patient will be on DVT as well as GI prophylaxis.  Anticipate      the patient being discharged in the next 24-48 hours if her renal      function improves.      Skeet Latch, DO  Electronically Signed     SM/MEDQ  D:  09/19/2007  T:  09/19/2007  Job:  [id number]   cc:   Dr. Lodema Hong

## 2010-07-26 NOTE — Group Therapy Note (Signed)
NAME:  [name], [name]                  ACCOUNT NO.:  [id number]   MEDICAL RECORD NO.:  [id number]          PATIENT TYPE:  INP   LOCATION:  A309                          FACILITY:  [facility]   PHYSICIAN:  Lucita Ferrara, MD         DATE OF BIRTH:  1950-04-26   DATE OF PROCEDURE:  DATE OF DISCHARGE:                                 PROGRESS NOTE   The patient admitted yesterday with constitutional symptoms including  shortness of breath, vague history of tightness in the chest, abdominal  pain.  Workup thus far including cardiac panel x3, beta natriuretic  peptide.  D-dimer was a bit high.  Her potassium was a bit low.  CT  angiogram was essentially negative.   VITAL SIGNS:  Temperature 98.4, pulse 78, respirations 18, blood  pressure 127/71.  HEENT:  Normocephalic, atraumatic.  CARDIOVASCULAR:  S1, S2.  ABDOMEN:  Soft, nontender, nondistended, positive bowel sounds.  EXTREMITIES:  No clubbing, cyanosis or edema.  LUNGS:  Clear to auscultation bilaterally.  No rhonchi, rales or  wheezes.   ASSESSMENT AND PLAN:  1. Shortness of breath/chest pain/abdominal pain.  2. History of diabetes.  3. Hypertension.  4. Bipolar disorder.  5. Diverticular disease.  6. History of glaucoma.  7. Sleep apnea.  8. Gait problems with multiple falls.  9. History of migraine headaches.  10.Chronic lower back pain.  11.Questionable history of a cerebrovascular accident.  12.History of altered mental status.   PLAN:  Continue current treatment, keep on telemetry for now.  Advance  diet as tolerated.  Replete potassium.  Social work for placement.  Given her altered mental status, we may consider a neurology  consultation.  I will get TSH, B12 and folate, and ammonia level on her.      Lucita Ferrara, MD  Electronically Signed    RR/MEDQ  D:  08/25/2007  T:  08/25/2007  Job:  [id number]

## 2010-07-26 NOTE — Op Note (Signed)
NAME:  [name], [name]                  ACCOUNT NO.:  [id number]   MEDICAL RECORD NO.:  [id number]          PATIENT TYPE:  AMB   LOCATION:  DAY                           FACILITY:  [facility]   PHYSICIAN:  R. Roetta Sessions, M.D. DATE OF BIRTH:  01/10/1951   DATE OF PROCEDURE:  11/19/2007  DATE OF DISCHARGE:                               OPERATIVE REPORT   PROCEDURE PERFORMED:  Esophagogastroduodenoscopy with Elease Hashimoto dilation.   INDICATIONS FOR PROCEDURE:  A 60 year old lady with persisting symptoms  of esophageal dysphagia.  She underwent a barium plus esophagogram,  which demonstrated no structural abnormalities.  She is on omeprazole 20  mg orally daily for gastroesophageal reflux disease, continues to have  persisting problem with symptoms of solid food dysphagia.  An EGD is now  being done.  Potential risks of esophageal dilation empirically has been  reviewed.  Risks benefits, and alternatives have been discussed,  questions answered, and all parties agreeable.   PROCEDURE NOTE:  O2 saturation, blood pressure, pulse, and respirations  were monitored throughout the entire procedure.   CONSCIOUS SEDATION:  Versed 5 mg IV and Demerol 125 mg IV in divided  doses.  Cetacaine spray for topical pharyngeal anesthesia.   INSTRUMENT:  Pentax video chip system.   FINDINGS:  Examination of the tubular esophagus revealed no mucosal  abnormalities, tubular esophagus was patent through the EG junction.  Stomach:  All gastric cavity was emptied and insufflated well with air.  Thorough examination of the gastric mucosa including retroflexed view of  the proximal stomach and esophagogastric junction demonstrated only a  small hiatal hernia.  Pylorus was patent, easily traversed.  Examination  of the bulb and second portion revealed no abnormalities.  Therapeutic/diagnostic maneuver was performed.  The scope was withdrawn,  and a 54-French Maloney dilator was passed in full insertion with ease,  a look  back revealed no apparent complications related to passage of the  dilator.  The patient tolerated the procedure well and was reacted in  endoscopy.   IMPRESSION:  Normal esophagus, status post passage of a 54-French  Maloney dilator.  Empirically, small hiatal hernia, otherwise normal  stomach, D1 and D2.   RECOMMENDATIONS:  1. Continue Prilosec/omeprazole 20 mg orally daily.  2. Her followup appointment is in 1 month.      Jonathon Bellows, M.D.  Electronically Signed     RMR/MEDQ  D:  11/19/2007  T:  11/20/2007  Job:  [id number]   cc:   Milus Mallick. Lodema Hong, M.D.  Fax: [phone]

## 2010-07-26 NOTE — Group Therapy Note (Signed)
NAME:  [name], [name]                  ACCOUNT NO.:  [id number]   MEDICAL RECORD NO.:  [id number]          PATIENT TYPE:  INP   LOCATION:  A309                          FACILITY:  [facility]   PHYSICIAN:  Lucita Ferrara, MD         DATE OF BIRTH:  [birth year]   DATE OF PROCEDURE:  DATE OF DISCHARGE:                                 PROGRESS NOTE   The patient was still complained of severe abdominal pain that is  intractable.  Yet, she had a CT scan of the abdomen and pelvis which was  essentially negative, status post hysterectomy.  There is slight  prominent size of the retrocaval lymph node of undetermined etiology,  likely a radiological finding.  In her abdomen there is no acute finding  otherwise.  Normal liver, spleen, normal kidneys, adrenal glands.  Exam  today, she is quite anxious again as yesterday.   PHYSICAL EXAMINATION:  HEENT:  Normocephalic, atraumatic.  Sclerae  anicteric.  Neck supple.  No JVD or carotid bruits.  PERRLA.  Extraocular muscles intact.  CARDIOVASCULAR:  S1 and S2.  ABDOMEN:  Obese, soft, nontender, nondistended, positive bowel sounds.  EXTREMITIES:  No clubbing, cyanosis or edema.  LUNGS:  Clear to auscultation bilaterally.  No rhonchi, rales or  wheezes.   LABORATORY DATA:  Complete metabolic panel, sodium 139, potassium low at  3.1.  Her CBC is normal except with microcytosis of a 69.  T3, T4 within  normal limits.  Lipase normal, amylase normal.  RPR nonreactive.  Folate  is normal.  Vitamin B12 is normal.  TSH slightly low.   ASSESSMENT AND PLAN:  A 60 year old African American female with chronic  irritable bowel syndrome, diarrhea prominent, admitted with chest pain,  shortness of breath and anxiety.   1. Diarrhea, rule out infectious etiology.  Stool has already been      ordered for Clostridium difficile x3.  We will reorder that now.  2. Flank pain.  Chronic abdominal pain with a negative CT scan.  3. Multiple other medical problems including diabetes,  hypertension,      bipolar adjustment psychiatric disorder, history of diverticular      disease, glaucoma, sleep apnea, history of multiple falls and      questionable history of altered mental status.  Will continue      current treatment for now.  Will continue per recommendations from      consultants.  At this point I do not think she needs neurology      consult in the patient any longer.  I do believe that her mental      status changes are likely chronic.  I do believe she has some      psychiatric issues that are also chronic, but she is at no risk for      suicide.  She has no suicidal or homicidal ideations.  Social work      is also on the case for possible placement at some point, as there      is an issue as regards her home  situation.  There is not specific      safety issues, but there is concern about family believing that she      needs a higher level of care.  Will continue her Catapres for her      hypertension.  Continue Plavix for coronary artery disease.      Continue Bentyl for      her abdominal irritable bowel syndrome.  Continue Neurontin for      neurological reasons.  Continue Ativan for anxiety.  Given that she      has diarrhea, she is on Flagyl q.8 h.  I will change that to p.o.      I encouraged her to ambulate with fall precautions, yet she is on a      deep venous thrombosis prophylaxis.      Lucita Ferrara, MD  Electronically Signed     RR/MEDQ  D:  08/27/2007  T:  08/27/2007  Job:  [id number]

## 2010-07-26 NOTE — Op Note (Signed)
NAME:  [name], [name]                  ACCOUNT NO.:  [id number]   MEDICAL RECORD NO.:  [id number]          PATIENT TYPE:  AMB   LOCATION:  DAY                           FACILITY:  [facility]   PHYSICIAN:  Tilford Pillar, MD      DATE OF BIRTH:  [birth year]   DATE OF PROCEDURE:  05/01/2008  DATE OF DISCHARGE:  05/01/2008                               OPERATIVE REPORT   PREOPERATIVE DIAGNOSES:  Lower extremity soft tissue lesions x3 with two  lesions in the right tight, one in the right lateral thigh, one right  anterior thigh, and one lesion in the left lateral thigh.   POSTOPERATIVE DIAGNOSES:  Lower extremity soft tissue lesions x3 with  two lesions in the right tight, one in the right lateral thigh, one  right anterior thigh, and one lesion in the left lateral thigh.   PROCEDURE:  Excision of lipomatous lesions x3 with a 2-cm incision in  the right lateral thigh, a 1.5-cm incision in the right anterior thigh,  and 2-cm incision left lateral thigh.   SURGEON:  Tilford Pillar, MD   ANESTHESIA:  General via laryngeal mask airway.   SPECIMEN:  Lipomatous lesions x3 to Pathology.   ESTIMATED BLOOD LOSS:  Minimal.   COMPLICATIONS:  None.   INDICATIONS:  The patient is a 60 year old female with multiple medical  problems who presented to my office with a history of painful with  chronic nodules on her lower extremities.  She stated these have not  given her much problem, so she had been told in the past that this could  be closely observed; however, she states that after trauma to the right  lateral thigh she noticed increase in size and pain associated with the  nodule on the right thigh.  On evaluation in the office, these were all  suspected to be lipomatous lesions with likely hematomas affecting the  right lateral thigh and the acute changes associated with this.  Advantages, risks, benefits, and alternatives of excision versus  continued observation were discussed at length with the  patient.  The  patient did wish to proceed with excision of all three lesions.  Risks  including but not limited to the risk of bleeding, recurrence, infection  were discussed at length with the patient.  The patient's questions and  concerns were addressed and the patient was consented for planned  procedure.   OPERATION:  The patient was taken to the operating room and was placed  in the supine position on the operating room table, at which time the  general anesthetic was administered.  Once she was asleep, a laryngeal  mask airway was placed, and then the patient's legs were prepped with  DuraPrep solution in both right and left thighs.  Sterile drapes were  then placed.  A half sheet drape was placed over both thighs and was cut  for exposure of the planned operative sites.  At this time, I did start  with the right lateral thigh.  A vertical incision was created over this  area with a scalpel.  This dissection  down to subcuticular tissue was  carried out using electrocautery.  I did encounter a significant amount  of edematous subcuticular tissue consistent with local and recent trauma  to the area.  I did identify an area of thickened lipomatous-appearing  fat and excised this in total with healthy-appearing adipose tissue  surrounding it.  This was within the subcuticular tissue.  No evidence  of any involvement of the underlying muscle was encountered.  At this  time, electrocautery was utilized to obtain hemostasis and then a 3-0  Vicryl was utilized to reapproximate the deep subcuticular tissue.  Then, a moistened Ray-Tec sponge was placed over the open incision.   At this time, I did turn my attention to the right anterior lesion.  This was just superior to the patella and it was easily palpated.  A  vertical incision then was created over this.  Additional dissection  down to subcuticular tissue was carried out using electrocautery.  Upon  entering into the capsule of the  lipoma, lipoma was easily expressed and  was removed in total.  Hemostasis obtained using electrocautery and then  a 4-0 Monocryl was utilized to reapproximate the skin edges in a running  continuous fashion.  I then returned back to the right lateral incision  and reapproximated the skin edges again with a 4-0 Monocryl in a running  subcuticular suture.  These were both covered with moistened Ray-Tec  sponges and attention was turned to the left lateral thigh.   Again, the drapes were trimmed to expose the planned surgical area which  had been previously prepped.  A vertical incision was created over this.  Additional dissection down to subcuticular tissue was carried out using  electrocautery.  Upon reaching the capsule of the lipoma, the lipoma was  easily expressed and removed in total.  Hemostasis was obtained with the  electrocautery and then a 4-0 Monocryl was utilized to reapproximate the  skin edges with a running subcuticular suture.  The skin was washed and  dried with a moist dry towel after the local anesthetic was instilled at  all 3 sites and a benzoin was applied around all 3 incision.  Half-inch  Steri-Strips were placed.  At this time, the drapes were removed.  The  patient was allowed to come out of general anesthetic and was returned  back to a regular hospital and transferred to the postanesthetic care  unit in stable condition.  At the conclusion of the procedure, all  instrument, sponge, and needle counts were correct.  The patient  tolerated the procedure extremely well.       Tilford Pillar, MD  Electronically Signed     BZ/MEDQ  D:  05/11/2008  T:  05/11/2008  Job:  [id number]   cc:   Milus Mallick. Lodema Hong, M.D.  Fax: [phone]

## 2010-07-26 NOTE — Assessment & Plan Note (Signed)
[facility]                         GASTROENTEROLOGY OFFICE NOTE   NAME:[name], [name] ARCAND                         MRN:          [id number]  DATE:10/02/2006                            DOB:          May 29, 1950    [name] is a 60 year old African-American female whom we have been  seeing since 1996 for chronic diarrhea and lower abdominal pain.  She  has had numerous colonoscopies and evaluations for inflammatory bowel  disease.  On the last colonoscopy in 2003, she was found to have  diverticulosis of the left colon.  She is also status post  cholecystectomy.  Patient is here today because of lower abdominal pain  which has been worse recently.   [name] was hospitalized in [place] in March, 2006 with mild  pancreatitis under the care of Dr. Karilyn Cota, which was related to  Depakote, which was since then discontinued.  An EUS by Dr. Christella Hartigan then  showed normal study.   Patient also has a history of anemia, which was microcytic and abnormal  capsule endoscopy in 2003.  There was a tubular adenoma on a colonoscopy  in 2001.  Patient has had massive weight loss from 225 pounds over the  past 7 years to currently 156 pounds.  She has suffered several CVAs and  has difficulty with mobility, being on Plavix 75 mg daily.  She was  diagnosed with sleep apnea and fatty liver.   MEDICATIONS:  1. Flexeril 10 mg p.o. p.r.n.  2. Omeprazole 20 mg p.o. daily.  3. Lorazepam q.i.d.  4. Plavix 75 mg p.o. daily.  5. Seroquel 50 mg nightly.  6. Clonidine 0.1 mg daily.  7. Topamax 25 mg 2 daily.  8. Bentyl 20 mg p.o. t.i.d.  9. Remeron 15 nightly.  10.Zoloft 200 mg p.r.n. q.p.m.  11.Singulair.  12.Lisinopril 10 mg p.o. daily.  13.Metoprolol 25 mg 1/2 tablet p.o. b.i.d.   PHYSICAL EXAMINATION:  VITAL SIGNS:  Blood pressure 108/68, pulse 68.  Weight 156 pounds.  Patient had slow speech, but she was alert and oriented.  Patient was  moving around with a walker and  had difficulty in stepping up to the  examining table.  LUNGS:  Clear to auscultation.  COR:  Normal S1 and S2.  She had truncal obesity.  ABDOMEN:  Soft but very tender in the left lower quadrant and across the  suprapubic area.  Bowel sounds were normoactive.  Liver edge at costal  margin.  RECTAL:  Decreased rectal tones.  Stool was soft and hemoccult negative.  EXTREMITIES:  No edema.   IMPRESSION:  26. 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  2. 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  3. New onset, lower abdominal pain, rule out diverticulitis or      symptomatic diverticulosis.  Rule out pelvic relaxation.  4. History of colonic polyps in 2001.  Patient is due for repeat      colonoscopy.   PLAN:  1. Continue Bentyl 20 mg p.o. t.i.d.  2. Use Imodium  p.r.n.  3. Hydrocodone 7.5/750, dispense 40 1 p.o. q.6h. p.r.n. abdominal      pain.  4. Colonoscopy scheduled.     Hedwig Morton. Juanda Chance, MD  Electronically Signed    DMB/MedQ  DD: 10/02/2006  DT: 10/02/2006  Job #: [id number]   cc:   Milus Mallick. Lodema Hong, M.D.

## 2010-07-26 NOTE — H&P (Signed)
NAME:  [name], [name]                  ACCOUNT NO.:  [id number]   MEDICAL RECORD NO.:  [id number]          PATIENT TYPE:  INP   LOCATION:  A309                          FACILITY:  [facility]   PHYSICIAN:  Lucita Ferrara, MD         DATE OF BIRTH:  1950/09/25   DATE OF ADMISSION:  08/24/2007  DATE OF DISCHARGE:  LH                              HISTORY & PHYSICAL   The patient is a 60 year old, presents with shortness of breath and a  very vague history, tightness in the chest and some lower mid abdominal  pain.  She denies any fevers or chills.  She denies any nausea,  vomiting, diaphoresis.  She denies no radiation.  There is no  presyncopal symptoms.  There is no cough.  There is no dysuria.  There  is no worsening or alleviating factors.   PAST MEDICAL HISTORY:  Significant for:  1. Hypertension.  2. Diabetes.  3. Bipolar disorder.  4. Diverticulitis.  5. Glaucoma.  6. Obstructive sleep apnea.  7. Questionable history of cerebrovascular accident by record.  8. Chronic migraine headache.   FAMILY HISTORY:  Noncontributory.   PAST SURGICAL HISTORY:  1. Carpal tunnel release.  2. Cataract surgery.  3. Status post cholecystectomy.  4. Status post hysterectomy.  5. Status post breast reduction.   SOCIAL HISTORY:  Denies drugs, alcohol, or tobacco.   ALLERGIES:  PENICILLIN, PYRIDIUM AND KEFLEX.   MEDICATIONS AT HOME:  Include:  1. Ambien 10 mg nightly.  2. Zetia orally 10 mg specialized dosing.  3. Phenergan 25 mg as needed.  4. Gabapentin 300 mg 3 times a day.  5. Toprol 12.5 mg b.i.d.  6. Ativan 1 mg, 4 times daily.  7. Sertraline 200 mg p.o. once a day.  8. Prilosec 20 mg p.o. daily.  9. Plavix 75 mg daily.  10.Clonidine 0.1 mg at bedtime.  11.Lisinopril 10 mg daily.  12.Singular 10 mg daily.  13.Topamax 25 mg b.i.d.  14.Bentyl 20 mg, 3 times a day.   PHYSICAL EXAMINATION:  Blood pressure is 126/71, pulse 83, respirations  20.  HEENT:  Normocephalic, atraumatic.  CARDIOVASCULAR:  S1, S2 regular rate and rhythm.  No murmurs, rubs or  click.  ABDOMEN:  Soft, nontender, nondistended.  Positive bowel sounds.  LUNGS:  Clear to auscultation bilaterally.  No rhonchi, rales or  wheezing.  NEURO:  The patient is alert and oriented x3.  Cranial nerves 2-12  grossly intact.   EKG normal sinus rhythm.  Unchanged from June 10, 2006   LABORATORY:  BNP less than 30.  CBC shows abnormality of microcytosis of  69.7.  Chest x-ray, minimal peribronchial thickening, otherwise normal.  BMP normal.  Urine drug screen positive for benzodiazepines and positive  for opiates, negative for cocaine.  First set of cardiac enzymes  negative. Urinalysis negative.   ASSESSMENT AND PLAN:  A 60 year old with vague symptoms, shortness of  breath, chest pain, abdominal pain.  Given her significant cardiac  history, we will go ahead and admit her to the medical telemetry unit.  Cardiac enzymes x3  q.8 h.  Given that she is not tolerating p.o. will  start on clear liquids and advance as tolerated.  Obviously appropriate  consultation will need to be made depending on her progress.  If she  cannot tolerate p.o. then we may get gastroenterology involved.  If her  cardiac enzymes are positive, or if she has electrocardiogram  abnormalities, will get cardiology involved.  For now we will continue  the rest of her medications.  Deep venous thrombosis and  gastrointestinal prophylaxis.      Lucita Ferrara, MD  Electronically Signed     RR/MEDQ  D:  08/24/2007  T:  08/24/2007  Job:  [id number]

## 2010-07-26 NOTE — Discharge Summary (Signed)
NAME:  [name], [name]                  ACCOUNT NO.:  [id number]   MEDICAL RECORD NO.:  [id number]          PATIENT TYPE:  INP   LOCATION:  5038                         FACILITY:  [facility]   PHYSICIAN:  Georgia Lopes, M.D.  DATE OF BIRTH:  [birth year]   DATE OF ADMISSION:  08/31/2008  DATE OF DISCHARGE:                               DISCHARGE SUMMARY   ADMISSION DIAGNOSIS:  Maxillary and mandibular atrophy.   SECONDARY DIAGNOSES:  1. Non-insulin-dependent diabetes.  2. Hypertension.  3. Gastroesophageal reflux disease.  4. Hypothyroid.  5. Status post cerebrovascular accident.  6. Bipolar.  7. History of urinary tract infection.  8. Chronic pain.   HOSPITAL COURSE:  The patient was admitted on August 31, 2008, for  preoperative intravenous antibiotics for proposed surgery on June 22.  On June 22 the patient underwent split thickness skin graft from the  right thigh to the mandible with mandibular vestibuloplasty and lowering  floor of mouth, insertion of oral surgical splint with circummandibular  wires, maxillary submucous vestibuloplasty with placement of  hydroxyapatite graft.  Postoperatively the patient had a fair amount of  edema and discomfort which required intravenous PCA pain management.  The patient remained afebrile and stable throughout her hospital course.  She was changed to oral pain medications on June 24.  On June 26 it was  initially thought that she had a temperature of 101.5 but a recheck of  that temperature revealed she was indeed afebrile.  She was discharged  on June 26 with mandibular splint wired in place, decreased edema of the  oral region with manageable pain on p.o. pain medicines.  Condition at  discharge was much improved.   DISCHARGE INSTRUCTIONS:  The patient was instructed to continue all her  at home medications.  She was given a prescription for Vicodin 5/500 x60  tabs to take 2 every 4 hours p.r.n. pain, Keflex 500 mg x28 tabs take 1  q.6 hours  for 7 days.  She was instructed to use warm saltwater mouth  rinses 4-5 times per day.  To remain on a mechanical soft diet.  To keep  the thigh dressing site dry.  She was scheduled for return visit in my  office on September 09, 2008.      Georgia Lopes, M.D.  Electronically Signed     SMJ/MEDQ  D:  09/05/2008  T:  09/05/2008  Job:  [id number]

## 2010-07-26 NOTE — Consult Note (Signed)
NAME:  [name], [name]                  ACCOUNT NO.:  [id number]   MEDICAL RECORD NO.:  [id number]          PATIENT TYPE:  INP   LOCATION:  A309                          FACILITY:  [facility]   PHYSICIAN:  R. Roetta Sessions, M.D. DATE OF BIRTH:  10-Feb-1951   DATE OF CONSULTATION:  08/26/2007  DATE OF DISCHARGE:                                 CONSULTATION   PRIMARY CARE PHYSICIAN:  Dr. Lodema Hong.   REASON FOR CONSULTATION:  Diarrhea/abdominal pain.   HISTORY OF PRESENT ILLNESS:  [name] is a 11 year old African American  female who was admitted with shortness of breath, chest tightness, and  abdominal pain on August 24, 2007.  She has been telling the nurses that  she is having diarrhea every time she eats However, the nurse tells me  when she puts bedpan under [name] there is no stool, and [name]  continues to state that she is in fact having diarrhea, even though this  is not confirmed by any the nursing staff.  They do note one soft brown  bowel movement since she has been here on August 24, 2007.  She has  history of chronic IBS, left upper quadrant abdominal pain, and  pancreatitis.  Her niece tells me that she has had a colonoscopy within  the last 8 months by Dr. Juanda Chance.  She has history of diverticulosis, and  previous colonoscopy was in March 2007 which showed diverticulosis as  well.  She does have history of tubular adenoma on colonoscopy in 2001.  She does note mucus in her stool.  Denies any rectal bleeding or melena.  She does have nausea, but denies any vomiting.  She complains of severe  left upper quadrant pain, 10/10 on pain scale.  Every time she eats, the  pain is worse.  She denies any constipation.  Denies any dysphagia or  odynophagia.  Denies any heartburn or indigestion.  Her niece tells me  her weight has declined due to the fact that her clothes are baggy, but  cannot quantify exact weight loss.  Since admission, she has been placed  on Flagyl, Bentyl, and she has a  pending Clostridium difficile.  She is  on Plavix and Lovenox.   PAST MEDICAL AND SURGICAL HISTORY:  1. Chronic diarrhea-predominant IBS since 1996.  She is followed by      Dr. Lina Sar in [place].  2. She has chronic low back pain.  3. Last colonoscopy as described in the HPI.  4. History of diverticulosis.  5. Status post cholecystectomy.  6. She had mild pancreatitis in March 2006.  She had a follow-up EUS      by Dr. Christella Hartigan, which was normal.  7. She has a history of chronic microcytic anemia.  8. She has a history of multiple CVAs.  9. Hypertension.  10.Bipolar disorder.  11.Anxiety.  12.Diabetes mellitus.  13.Glaucoma.  14.Sleep apnea.  15.Migraine headaches.  16.Carpal tunnel release.  17.Cataract surgeries.  18.Complete hysterectomy.  19.Breast reduction.   MEDICATIONS PRIOR TO ADMISSION:  1. Zolpidem 10 mg p.r.n.  2. Zetia 10 mg  daily.  3. Phenergan 25 mg p.r.n.  4. Gabapentin 300 mg t.i.d.  5. Metoprolol 12.5 mg b.i.d.  6. Lorazepam 1 mg q.i.d.  7. Sertraline 200 mg daily.  8. Prilosec 20 mg daily.  9. Plavix 75 mg daily.  10.Clonidine 0.1 mg at bedtime.  11.Lisinopril 10 mg daily.  12.Singulair 10 mg daily.   ALLERGIES:  1. KEFLEX  2. PYRIDIUM.  3. PENICILLIN.  4. CASTOR OIL.   FAMILY HISTORY:  There is no known family history of colorectal  carcinoma.  Father deceased at age 31 secondary to pancreatic carcinoma.  Mother deceased at 13 secondary to coronary artery disease and MI.  She  has a paternal aunt with history of colon cancer.   SOCIAL HISTORY:  [name] is divorced.  She lives with her only son.  She does have a history of tobacco abuse, but currently does not smoke.  Denies any alcohol or drug use.  She is retired from [employer], [employer].   REVIEW OF SYSTEMS:  See HPI, otherwise negative.   PHYSICAL EXAMINATION:  VITAL SIGNS:  Weight 68.9 kg, height 62 inches,  temperature 98.1, blood pressure 157/78, pulse 83, respirations 20, O2   saturation 97% on room air.  GENERAL:  [name] is an alert, cooperative [place] American female who  is in no acute distress.  She is oriented.  She is accompanied by her  niece today.  HEENT:  Clear clear, nonicteric.  Conjunctivae pink.  Oropharynx pink  and moist, without any lesions.  NECK:  Supple, without any masses or thyromegaly.  CHEST:  Heart:  Regular rate and rhythm.  Normal S1, S2, without  murmurs, clicks, rubs, or gallops.  LUNGS:  Clear to auscultation bilaterally.  ABDOMEN:  Positive bowel  sounds x4.  No bruits auscultated.  Soft, nondistended.  She does moan  with severe pain to palpation of the left upper quadrant as well as  epigastrium.  There is no rebound tenderness or guarding.  No  hepatosplenomegaly or mass, although exam is limited given the patient's  body habitus.  EXTREMITIES:  Without edema bilaterally.   LABORATORY STUDIES:  Hemoglobin 11.5, hematocrit 36.2, platelets 383.  White blood cell count 7.6, MCV 69 from August 24, 2007.  She had a  positive D-dimer at 0.9.  Sodium 141, potassium 3.6, chloride 108, CO2  24, BUN 12, creatinine 0.7, glucose 102, calcium 8.8, total bilirubin  0.7, alkaline phosphatase 83, AST 29, ALT 19, total protein 6.5, albumin  2.9, phosphorus 4.2, and magnesium 1.7.  TSH was low at 0.257.  B12  normal at 635, folate normal at 17.7.  RPR nonreactive.  She had a CT  angiogram of the chest which showed scattered atelectasis and right  upper lobe infiltrate, left thyroid nodule.   IMPRESSION:  [name] is a 60 year old African American female with  chronic IBS, diarrhea predominant, admitted with shortness of breath,  chest and abdominal pain.  She complains of left upper quadrant pain and  diarrhea every time I eat subjectively. This is not confirmed  objectively by nursing staff.  Etiology of her pain could include  recurrent pancreatitis, IBS, and less likely diverticulitis.   She also has an abnormal TSH and thyroid nodule,  which is going to need  further workup per her primary care physician.   She has a history of microcytic anemia.   PLAN:  1. Follow up with hospitalists regarding abnormal TSH and thyroid      nodule.  2. Amylase, lipase,  CBC today.  3. Full set of stool studies if diarrhea is confirmed  4. Agree with supportive measures, including IV fluids, pain control,      etc.  5. CT of abdomen, pelvis with IV and oral contrast.   We would like to thank the [facility] for allowing [name] to  participate in the care of Ms. Tuel.      Lorenza Burton, N.P.      Jonathon Bellows, M.D.  Electronically Signed    KJ/MEDQ  D:  08/26/2007  T:  08/26/2007  Job:  [id number]   cc:   Milus Mallick. Lodema Hong, M.D.  Fax: [phone]   Hedwig Morton. Juanda Chance, MD  [address]  [place]  [place] [address]

## 2010-07-26 NOTE — Group Therapy Note (Signed)
NAME:  [name], [name]                  ACCOUNT NO.:  [id number]   MEDICAL RECORD NO.:  [id number]          PATIENT TYPE:  OBV   LOCATION:  A323                          FACILITY:  [facility]   PHYSICIAN:  Skeet Latch, DO    DATE OF BIRTH:  1950-10-12   DATE OF PROCEDURE:  07/16/2008  DATE OF DISCHARGE:                                 PROGRESS NOTE   SUBJECTIVE:  Ms. Rotenberg continues to complain of some epigastric  discomfort with some radiation to her throat area and also radiation to  the left side of her chest.  The patient was seen by cardiology and they  felt it was not cardiac in nature and feel there is no other cardiac  workup needed.  The patient states the pain comes and goes.  She was  complaining of pain early in the evening.   OBJECTIVE:  VITAL SIGNS:  Temperature 98, pulse 60, respirations 24,  blood pressure 106/67.  She is saturating 99%.  CARDIOVASCULAR:  Regular rate and rhythm.  No murmurs, rubs or gallops.  LUNGS:  Clear.  No rales, rhonchi or wheezes.  ABDOMEN:  Soft.  She does have some epigastric tenderness with some  radiation to the left side of her chest on deep palpation.  Positive  bowel sounds.  No rigidity or guarding.  EXTREMITIES:  No clubbing, cyanosis or edema.   LABORATORY DATA:  Show a hemoglobin A1c of 6.8, total creatinine kinase  134, CK-MB 1.8, troponin is 0.01.  D-dimer was 0.31, lipase was 22.  CT  angiogram of her chest showed no focal filling defect to suggest  pulmonary embolus.  It showed multifocal ill-defined air space disease,  worrisome for pneumonia.  Dependent volume loss, compatible with  atelectasis.   ASSESSMENT/PLAN:  1. Chest pain.  CT angiogram showed pneumonia.  Cardiology has      evaluated the patient and do not feel there is any more testing      that needs to be done at this time.  We will workup a GI source and      get a gastroenterology consultation at this time.  We will increase      her Protonix to twice daily.  2. For  her diabetes type 2, continue with her current medical regimen      with her CBGs q.a.c. and h.s.  3. She does have a history of sleep apnea.  Continue with continuous      positive airway pressure.  4. The patient has a history of altered mental status changes on      multiple admissions.  The patient is on chronic narcotics.  It is      highly possible the patient has polypharmacy.  She did not appear      to have any mental status changes on my exam.  5. For her hypertension, continue with her current medications.  6. For her other chronic condition, including her bipolar and she is      complaining of migraines, continue with her medications.  7. The patient will be continued on deep  venous thrombosis      prophylaxis.      Skeet Latch, DO  Electronically Signed     SM/MEDQ  D:  07/16/2008  T:  07/16/2008  Job:  [id number]

## 2010-07-26 NOTE — Consult Note (Signed)
NAME:  [name], [name]                  ACCOUNT NO.:  [id number]   MEDICAL RECORD NO.:  [id number]          PATIENT TYPE:  OBV   LOCATION:  A323                          FACILITY:  [facility]   PHYSICIAN:  Antonieta Iba, MD   DATE OF BIRTH:  [birth year]   DATE OF CONSULTATION:  07/15/2008  DATE OF DISCHARGE:                                 CONSULTATION   [name] is a patient who has been previously seen by Dr[facility] Domingo Sep of  [facility] and is a patient of Dr. Claris Che  [name]'s.  We are asked to consult given her recent admission for chest  pain.   [name] has a history of chest pain.  [name] had a cardiac  catheterization in February of 2007 showing essentially no coronary  artery disease with ejection fraction of 65%, no significant renal  artery stenosis.  She has an echocardiogram from October of 2009 which  was essentially normal with ejection fraction of greater than 55%.  She  was last seen in the clinic by me in March of 2010 when she was feeling  well and she stated that she would like to come off of some of her  medications and complained of a 30 pound weight gain with prednisone.   She presented to [facility] with chest discomfort.  She has had  a history of polypharmacy and was admitted April 25 with altered mental  status and she is a poor historian today and is unable to give a good  history of her chest discomfort but does state that it wraps around her  left breast and under into her axilla and it has been there some time  and sometimes hurts when she pushes on it and sometimes when she takes a  big breath in.  Details are relatively uncertain as she is not able to  provide them.  The notes/H and P suggests that she had some radiation to  her back and that her symptoms started 3-4 days ago.  She is currently  living in assisted living.   PAST MEDICAL HISTORY:  Is notable for sleep apnea and she is currently  on CPAP, diabetes,  hypertension, chronic migraine headaches, history of  stroke with left-sided weakness, chronic pain syndrome, bipolar  disorder, possible schizophrenia.   CURRENT MEDICATIONS:  1. Clonidine 0.1 mg b.i.d.  2. Neurontin 600 mg daily.  3. Dicyclomine 20 mg t.i.d.  4. Glipizide XL 2.5 mg daily.  5. Evoxac 30 mg daily.  6. Lamotrigine.  7. Metoprolol 25 b.i.d.  8. Lisinopril 10 mg p.o. daily.  9. Omeprazole 20 mg p.o. daily.  10.Lorazepam one daily and 2 mg at bedtime.  11.Plavix 75 mg daily.  12.Sertraline 200 mg daily.  13.Singulair 10 mg daily.  14.Lovastatin 80 mg q.h.s.  15.Mirtazapine 30 mg at bedtime.  16.Ambien 10 mg at bedtime.  17.CPAP.  18.Fentanyl patch 50 mcg q.3 days.   SOCIAL HISTORY:  She lives in assisted living but no smoking or alcohol  use.   PHYSICAL EXAMINATION:  VITAL SIGNS:  Her  blood pressures are in the 130s  to 150s over 60s to 70s, heart rate in the 70s, respirations of 18-20,  satting greater than 95% on room air.  GENERAL:  She does not appear to be in any unusual distress and  comfortable in bed.  She closes her eyes for most of the evaluation and  appeared to be drifting off to sleep but was easily arousable and  mumbles which was difficult to interpret.  HEENT:  Is essentially benign.  NECK:  Neck is supple with no carotid bruits or JVP.  HEART:  Her heart sounds are regular with S1 and S2 and no murmurs  appreciated.  LUNGS:  Essentially clear to auscultation bilaterally.  ABDOMEN:  Her abdominal exam is benign.  She has no significant lower  extremity edema.  NEUROLOGIC:  Exam nonfocal.  SKIN:  Warm and dry.   EKG shows normal sinus rhythm with no significant ST-T wave changes.   Initial labs show cardiac enzymes negative x2 sets with the CBC and chem-  7 within normal limits.   In summary, [name] has a history of atypical chest pain in the  past with evaluation back in November of 2009.  She has had a cardiac  catheterization,  stress testing and echocardiography all of which is  essentially negative.  Her symptoms are atypical in nature, it seems to  be worse with inspiration and reproducible with palpation.  Enzymes are  so far negative and EKG is negative.  I think her symptoms are likely  noncardiac.   I would complete her rule out for MI and consider other causes of  atypical type chest pain.  For now would continue her on her current  medications.  The etiology of her mental status is a little bit  uncertain though certainly concerning for too much medication for pain  as she was very alert and appropriate when last seen in the clinic May 22, 2008.  We can follow along with her and I currently have no  significant suggestions for her management as no additional workup is  needed at this time.      Antonieta Iba, MD  Electronically Signed     TJG/MEDQ  D:  07/15/2008  T:  07/15/2008  Job:  [id number]

## 2010-07-26 NOTE — Consult Note (Signed)
NAME:  Brazzle, Arlicia                  ACCOUNT NO.:  [id number]   MEDICAL RECORD NO.:  [id number]          PATIENT TYPE:  OBV   LOCATION:  A323                          FACILITY:  [facility]   PHYSICIAN:  Kassie Mends, M.D.      DATE OF BIRTH:  [birth year]   DATE OF CONSULTATION:  07/16/2008  DATE OF DISCHARGE:                                 CONSULTATION   REFERRING PHYSICIAN:  Dr. Skeet Latch.   PRIMARY PHYSICIAN:  Milus Mallick. Lodema Hong, M.D.   REASON FOR CONSULTATION:  Abdominal pain.   HISTORY OF PRESENT ILLNESS:  Ms. Mosteller is a 60 year old female with a  known history of IBS with diarrhea.  She also has a history of abdominal  pain associated with abnormal liver enzymes.  Her last upper endoscopy  was in September of 2009.  Her last colonoscopy was in 2007.  She had  diverticulosis of the ascending and sigmoid colon.  She has also had a  cholecystectomy.  She complains of abdominal pain that started  approximately 7 days ago.  She gets better and gets worse.  She  describes it as shooting pain in the middle of her abdomen  (epigastrium).  It does not radiate.  Her current episode started at  3:30 p.m.  She states her pain is very severe.  She is writhing in the  bed.  She does take Bentyl for irritable bowel.  She admits to nausea  but no vomiting.  She has no heartburn or indigestion.  She says she has  regular bowel movements.  She has no dysuria, hematuria, blood in her  urine, blood in her stool or diarrhea.   PAST MEDICAL HISTORY:  1. Low back pain.  2. Mild pancreatitis due to Depakote with a normal EUS in 2006.  3. Cerebrovascular disease.  4. Hypertension.  5. Bipolar disorder.  6. Anxiety.  7. Diabetes.  8. Glaucoma.  9. Sleep apnea.  10.Migraines.  11.Simple adenomas and hyperplastic polyps on colonoscopy on 2001.   PAST SURGICAL HISTORY:  1. Breast reduction.  2. Hysterectomy.  3. Cataract surgery.  4. Carpal tunnel surgery.   ALLERGIES:  KEFLEX, PYRIDIUM,  PENICILLIN.   MEDICATIONS:  1. Ventolin.  2. Catapres.  3. Plavix.  4. Bentyl.  5. Lovenox.  6. Duragesic.  7. Neurontin.  8. Glucotrol.  9. NovoLog sliding scale.  10.Lamictal.  11.Levaquin 500 mg q.24 hours.  12.Prinivil.  13.Lopressor.  14.Singulair.  15.Toradol (60 mg on May 5).  16.Phenergan as needed (12.5 mg on May 5).   FAMILY HISTORY:  Her aunt had colon cancer.  Her sister had pancreatic  cancer.   SOCIAL HISTORY:  She is disabled.  She has no history of tobacco or  alcohol use.   REVIEW OF SYSTEMS:  As per the HPI, otherwise all systems are negative.   PHYSICAL EXAMINATION:  VITAL SIGNS:  T max 98.7, systolic blood pressure  163-106.  GENERAL:  She is alert and interactive.  She appears very uncomfortable.  HEENT:  Atraumatic, normocephalic.  Pupils equal and reactive to light.  Mouth, no oral  lesions, moist mucosa.  Posterior pharynx without  erythema or exudate.  NECK:  Neck has full range of motion and no lymphadenopathy.  LUNGS:  Clear to auscultation bilaterally.  CARDIOVASCULAR:  Shows regular rhythm, no murmur, normal S1-S2.  ABDOMEN:  Bowel sounds present, soft, obese, moderate tenderness to  palpation in the epigastrium without rebound or guarding.  EXTREMITIES:  No cyanosis or edema.  NEUROLOGICAL:  She has no focal neurologic deficits.   LABS:  Hemoglobin 10.7, platelets 250, creatinine 0.81.  Liver panel  normal.  Lipase 22.  Initial creatinine kinase 262 and trended down to  134.  Troponins negative.   RADIOGRAPHIC STUDIES:  CT angio of the chest revealed multifocal ill-  defined air space disease worrisome for pneumonia.   ASSESSMENT:  Ms. Bergevin is a 61 year old female who likely has irritable  bowel syndrome and is having an exacerbation.  The differential  diagnosis includes biliary colic, low likelihood of pancreatitis, NSAID  gastritis or Helicobacter pylori gastritis.  She has never had a gastric  mucosal biopsy.  Her pain seems to be  significant.  The differential  diagnosis includes a low likelihood of eosinophilic gastritis.   Thank you for allowing me to see Ms. Roethler in consultation.  My  recommendations follow:   RECOMMENDATIONS:  1. CT scan of the abdomen and pelvis now and if no source for      abdominal pain can be identified then would consider an EGD on      tomorrow.  2. Stat lipase, hepatic function panel and creatinine kinase.  3. Change to full liquid diet.  4. Stop the Toradol and will change to morphine.  I explained to Ms.      Deakin that if no significant intra-abdominal pathology is revealed      then the morphine will be discontinued.   ADDENDUM:  CT scan of the abdomen and pelvis with contrast revealed  bilateral lower air space disease which reflects atelectasis.  Probable  fatty infiltration.  Minimal diverticular changes in the colon.  Her  liver panel was normal except for an albumin of 3.2, lipase 19, total CK  97.  Will proceed with upper endoscopy on Jul 18, 2007.      Kassie Mends, M.D.  Electronically Signed     SM/MEDQ  D:  07/17/2008  T:  07/17/2008  Job:  [id number]   cc:   Milus Mallick. Lodema Hong, M.D.  Fax: [phone]

## 2010-07-26 NOTE — Discharge Summary (Signed)
NAME:  Thorup, Kyleigh                  ACCOUNT NO.:  [id number]   MEDICAL RECORD NO.:  [id number]          PATIENT TYPE:  INP   LOCATION:  A318                          FACILITY:  [facility]   PHYSICIAN:  Skeet Latch, DO    DATE OF BIRTH:  Jul 19, 1950   DATE OF ADMISSION:  07/05/2008  DATE OF DISCHARGE:  04/27/2010LH                               DISCHARGE SUMMARY   ADDENDUM:  Please on her discharge medications delete hydrocodone APAP  7.5/500 mg.  This medication will be discontinued on discharge.      Skeet Latch, DO  Electronically Signed     SM/MEDQ  D:  07/07/2008  T:  07/07/2008  Job:  [id number]

## 2010-07-26 NOTE — Discharge Summary (Signed)
NAME:  [name], [name]                  ACCOUNT NO.:  [id number]   MEDICAL RECORD NO.:  [id number]          PATIENT TYPE:  OBV   LOCATION:  A323                          FACILITY:  [facility]   PHYSICIAN:  Dorris Singh, DO    DATE OF BIRTH:  May 09, 1950   DATE OF ADMISSION:  07/14/2008  DATE OF DISCHARGE:  05/09/2010LH                               DISCHARGE SUMMARY   ADMISSION DIAGNOSIS:  1. Chest pain, likely pleuritic.  2. Type 2 diabetes.  3. Chronic pain syndrome.  4. Chronic migraine headaches.  5. History of strokes.  6. History of depression.  7 . Hypertension poorly controlled.  1. History of psychiatric illness, resulting in poor history.   DISCHARGE DIAGNOSES:  1. Atelectasis.  2. Type 2 diabetes.  3. History of anxiety.  4. History of anemia.   RADIOLOGY TESTS:  Include on the fifth CT angio chest no focal finding  to suggest pulmonary embolus.  Multiple ill-defined airspace disease  worrisome for pneumonia dependent volume loss compatible with  atelectasis.  A CT of the abdomen and pelvis on the 6th which showed  bilateral air space disease likely reflects atelectasis status post  cholecystectomy probable diffuse fatty infiltration of the liver and no  acute abnormality of the of the abdomen.  Her pelvis showed no acute  abnormality of the pelvis status post hysterectomy and minimal  diverticular change without focal immunization.   HOSPITAL COURSE:  The patient was admitted to the service of [facility]  with the above diagnoses.  GI was consulted and so was [facility]  Cardiology.  [facility] believed that this was atypical  chest pain and did not have any recommendations for management.  Also  Dr. Kassie Mends saw her for abdominal pain and a CT showed bilateral  airspace disease.  The plan would be to do an EGD on May 7 which  recommended a low-fat, soft mechanical diet.  To be restarted Glucotrol  30 minutes after her first meal.  To hold her Lovenox.   Increase her  omeprazole as an outpatient to b.i.d. and add Carafate slurry and to  hold her Plavix.  She was mentioned to me that she could be discharged  on the 8th.  However, she complained that day of her abdomen hurting her  and that she could not go to the bathroom and a bladder scan was done.  Today was determined she could be discharged.  She was upset that she  was not getting her Xanax, but had no other complaints.  I would go  ahead and discharge her to home.  At this point in time due to the  atelectasis I will not place her on any antibiotic therapy, due to this  not being a pneumonia and her chest pain was resolved.  She will go on  the following medications Zofran 4 mg as needed, mirtazapine 50 mg at  bedtime, Flexeril 5 mg every 8 hours, Tylenol 325 as needed, Bentyl 20  mg 3 times a day, clonidine 0.1 half tablet two times a day, Boniva 150  mg monthly, Ambien  10 mg at bedtime, meclizine 12.5 mg three times a  day, lorazepam 1 mg two times a day as needed, metoprolol 12.5 times a  day, Singulair 10 mg daily, gabapentin 600 mg two times a day, ProAir  inhaler 2 puffs at bedtime, glipizide 2.5 mg daily, Lamictal 100 mg  daily, lisinopril 10 mg daily, lovastatin 40 mg x2 tablets bedtime, and  Fentanyl patch 50 mcg every 72 hours.  She is to restart Plavix on Jul 21, 2008, omeprazole twice a day for 3 months, no aspirin or NSAID for  30 days, Carafate slurry four times a day, and viscus lidocaine 2  teaspoons p.o. q.4 hours p.r.n. pain.  She is to follow up with her  primary care physician and with GI in 2 weeks, and she is to return if  symptoms worsen.      Dorris Singh, DO  Electronically Signed     CB/MEDQ  D:  07/19/2008  T:  07/19/2008  Job:  [id number]

## 2010-07-26 NOTE — Discharge Summary (Signed)
NAME:  [name], [name]                  ACCOUNT NO.:  [id number]   MEDICAL RECORD NO.:  [id number]          PATIENT TYPE:  OBV   LOCATION:  A332                          FACILITY:  [facility]   PHYSICIAN:  Osvaldo Shipper, MD     DATE OF BIRTH:  Jan 27, 1951   DATE OF ADMISSION:  09/14/2008  DATE OF DISCHARGE:  07/07/2010LH                               DISCHARGE SUMMARY   PAST MEDICAL HISTORY:  Milus Mallick. Lodema Hong, M.D.   The patient resides at an assisted living facility.   DISCHARGE DIAGNOSES:  1. Altered mental status secondary to narcotics and benzodiazepines,      improved.  2. Polypharmacy.  3. Hypertension.  4. Hyperthyroidism.  5. 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  6. Type 2 diabetes.   Please see H&P dictated by Dr. Della Goo for details regarding  patient's present illness.   CONSULTATIONS:  None during this admission.   IMAGING STUDIES:  None.   BRIEF HOSPITAL COURSE:  1. Altered mental status.  This is a 60 year old African American      female who is well known to our service for numerous admissions who      presented with decreased responsiveness at the assisted living      facility.  The patient was apparently started on Fentanyl patch the      day prior to admission and was given Ambien 10 as well as two      tablets of Lortab.  So, she was found to be unresponsive.  She was      brought into the ED.  Narcan was given with minimal response, and      so the patient was admitted for further evaluation and observation.      The patient improved slowly. She recovered slowly.  Yesterday, she      stated that she was hungry and was given a diet.  The patient has      come back to her baseline now.  2. Recent oral surgery.  She had surgery on June 22 for maxillary and      mandibular atrophy.  This was done by Dr. Ocie Doyne at [facility].  She had a skin graft from her right thigh to the      mandible and also had  vestibuloplasty of the mandible with lowering      of the floor of the mouth.  The patient is complaining of pain in      the mouth.  She is requesting some viscus lidocaine which we will      prescribe.  She says she has a follow up with this Nikeria Kalman      tomorrow morning, and so we will facilitate her discharge so this      appointment can be kept.  3. Anemia.  She was found to have a hemoglobin of 8.3, which went down      to 7.2 the following day.  The patient denies any black stool or      blood in the stool.  She was transfused one unit and it came up to      8.2 and this morning it is 9.5.  4. Polypharmacy.  We are trying to cut down as much of her medications      as possible, but this is a patient who has used quite a bit of      medications, so this decision will mostly be deferred to her PMD.  5. On the day of discharge, the patient is complaining of pain in her      mouth.  She wants her Foley to be taken out.  Otherwise, she is      feeling well.  Denies any other issues.   PHYSICAL EXAMINATION:  VITAL SIGNS:  Vital signs are all stable.  Blood  pressure is running a bit high at 169/78.  Saturation 98% on room air .  LUNGS:  Clear to auscultation bilaterally.  CARDIOVASCULAR:  S1, S2 normal.  Regular.  No murmurs appreciated.  No  S3, S4.  No rubs or bruits.  ABDOMEN:  Soft, nontender, nondistended.   DISPOSITION:  Overall, she is stable for discharge back to assisted  living facility.  We will provide her with viscus lidocaine before  discharge.   DISCHARGE MEDICATIONS:  1. Benazepril 40 mg daily.  2. Aciphex 20 mg b.i.d.  3. Singulair 10 mg daily.  4. Metoprolol 12.5 mg b.i.d.  5. Clonidine 0.1 mg half tablet b.i.d.  6. Carafate q.i.d.  7. Levoxyl 50 mcg daily.  8. Lovastatin 80 mg at bedtime.  9. Dicyclomine 20 mg t.i.d.  10.Boniva 150 mg once a month.  11.Glipizide 2.5 mg in the morning.  12.Mirtazapine 30 mg at bedtime.  13.Sertraline 200 mg daily.   14.Lorazepam 1 mg b.i.d.  15.Zolpidem 10 mg at bedtime.  16.Gabapentin 600 mg t.i.d.  17.Lamotrigine 100 mg daily.  18.Plavix 75 mg daily.  19.Lisinopril 10 mg daily.  20.ProAir at bedtime.  21.Nystatin to skin folds b.i.d.  22.Fluticasone two sprays to nostril once a day.  23.Endocet 10/325 q.6 h p.r.n. pain.  24.Evoxac 300 mg t.i.d.  25.Chlorhexidine 0.12% solution 15 ml to be used to rinse mouth as      needed.  26.Viscus lidocaine 10 cc q.6 h p.r.n. oral pain.   Please note we have discontinued her Fentanyl and her night dose of  Lorazepam as these were thought to be the precipitating factors for her  event.   FOLLOWUP:  Follow up with oral surgeon tomorrow morning, with PMD as  needed.   DISCHARGE INSTRUCTIONS:  1. Diet:  As before.  2. Physical activity:  As before.   TOTAL TIME OF DISCHARGE:  Thirty five minutes.      Osvaldo Shipper, MD  Electronically Signed     GK/MEDQ  D:  09/16/2008  T:  09/16/2008  Job:  [id number]   cc:   Milus Mallick. Lodema Hong, M.D.  Fax: [phone]

## 2010-07-26 NOTE — Discharge Summary (Signed)
NAME:  [name], [name]                  ACCOUNT NO.:  [id number]   MEDICAL RECORD NO.:  [id number]          PATIENT TYPE:  INP   LOCATION:  A323                          FACILITY:  [facility]   PHYSICIAN:  Skeet Latch, DO    DATE OF BIRTH:  1950-09-04   DATE OF ADMISSION:  02/02/2008  DATE OF DISCHARGE:  11/27/2009LH                               DISCHARGE SUMMARY   ADDENDUM   For her medications please change her calcium plus vitamin D three times  a day to Citracal 200 mg/250 international units with vitamin D two tabs  twice a day.  Resume Catapres at 0.3 mg nightly.  Please take out  isosorbide, please take out prednisone.      Skeet Latch, DO  Electronically Signed     SM/MEDQ  D:  02/07/2008  T:  02/07/2008  Job:  [phone]

## 2010-07-26 NOTE — Procedures (Signed)
NAME:  [name], [name]                  ACCOUNT NO.:  [id number]   MEDICAL RECORD NO.:  [id number]          PATIENT TYPE:  INP   LOCATION:  A323                          FACILITY:  [facility]   PHYSICIAN:  Kofi A. Gerilyn Pilgrim, M.D. DATE OF BIRTH:  1950/05/28   DATE OF PROCEDURE:  DATE OF DISCHARGE:                              EEG INTERPRETATION   The patient is a 60 year old lady who presents with recurrent bouts of  confusion, suspicious for complex partial seizures.   MEDICATIONS:  Plavix, Lovenox, Zocor, Zoloft, Protonix, Bentyl, Zestril,  vitamin D, Lopressor, insulin, Singulair, Seroquel, Catapres, Flexeril,  Avandia, Duragesic patch, Neurontin, Phenergan, Zofran, Ambien,  Ventolin, and Tylenol.   ANALYSIS:  A 16-channel recording is conducted for 24 minutes. There is  a posterior rhythm, I guess as high as 7.5 to 8 Hz.  There is beta  activity observed in the frontal areas.  A good portion of the recording  occurred during sleep.  She did have stage II sleep with K-complexes and  spindles observed.  Photic stimulation is carried out without  significant changes in background activity.  There is no focal or  lateralized slowing.  There is no epileptiform activity observed.   IMPRESSION:  This recording shows mild generalized slowing.  However,  there is no epileptiform activity observed.      Kofi A. Gerilyn Pilgrim, M.D.  Electronically Signed     KAD/MEDQ  D:  02/05/2008  T:  02/06/2008  Job:  [id number]

## 2010-07-26 NOTE — H&P (Signed)
NAME:  [name], [name]                  ACCOUNT NO.:  [id number]   MEDICAL RECORD NO.:  [id number]          PATIENT TYPE:  INP   LOCATION:  A323                          FACILITY:  [facility]   PHYSICIAN:  Osvaldo Shipper, MD     DATE OF BIRTH:  August 12, 1950   DATE OF ADMISSION:  02/02/2008  DATE OF DISCHARGE:  LH                              HISTORY & PHYSICAL   PRIMARY CARE PHYSICIAN:  Milus Mallick. Lodema Hong, M.D.   CARDIOLOGIST:  Dani Gobble, M.D.   NEUROLOGIST:  Dr. Benson Setting.   ADMISSION DIAGNOSES:  1. Chest pain, rule out acute coronary syndrome.  2. Type 2 diabetes.  3. Hypertension.  4. Bipolar disorder.  5. History of chronic headaches.  6. History of sleep apnea.  7. History of stroke in the past.   CHIEF COMPLAINT:  Chest pain.   HISTORY OF PRESENT ILLNESS:  The patient is a 60 year old African  American female who is well known to our service for previous  admissions.  She lives in an assisted living facility and was going to  her church this morning at around 11 o'clock when she got out of the car  and started feeling tightening of muscles all over the body.  This was  especially predominant in the chest area.  The pain worsened  progressively to become 10/10 in intensity.  She also experienced what  she calls fluttering of her heart.  The patient sat down in the church.  She felt dizzy and light-headed.  She felt that she had a lot of gas.  She had 7-Up with no relief.  She also had some nausea but no emesis.  She felt short of breath which was at her baseline.  She felt cold.  She  denied any cough or fever.  No sick contacts.  The patient's symptoms  did not improve and so this evening she decided to come in to the  hospital.  Currently she has very minimal chest tightness.   MEDICATIONS AT HOME:  1. Plavix 75 mg daily.  2. Lovastatin 80 mg at bedtime.  3. Singulair 10 mg daily.  4. Zoloft 200 mg at bedtime.  5. Omeprazole 20 mg daily.  6. Dicyclomine 20 mg t.i.d.  7.  Gabapentin 300 mg t.i.d.  8. Lisinopril 10 mg b.i.d.  9. Calcium plus Vitamin D three times a day.  10.Metoprolol 12.5 mg, unknown frequency.  11.Evoxac 30 mg once a day.  12.Darvocet twice a day.  13.Avandia 4 mg at bedtime.  14.Boniva 150 mg once a month.  15.Flexeril 10 mg at bedtime.  16.Ambien 10 mg at bedtime.  17.Clonidine unknown dose at bedtime.  18.Ativan 1 mg b.i.d. and at bedtime.  19.Seroquel 50 mg at bedtime.  20.Phenergan as needed.  21.Fentanyl patch 25 mcg q.3 d.   She was recently discontinued off of Topamax and Isosorbide mononitrate  for unclear reasons.   ALLERGIES:  1. KEFLEX.  2. PENICILLIN.  3. PYRIDIUM.   PAST MEDICAL HISTORY:  1. Sleep apnea on CPAP.  2. Diabetes, type 2.  3. Hypertension.  4. Chronic migraine  headaches.  5. History of stroke with left-sided weakness.  6. She mentions having had a stress test and a coronary angiogram over      the last two years, both of which have been negative.  No reports      are available on e-chart.  The last time she saw Dr. Domingo Sep was      one month ago and underwent possibly an echocardiogram, though she      is not very sure.  7. She has chronic pain syndrome.   SOCIAL HISTORY:  She lives in an assisted living facility by the name of  [facility] at [address] in [place], [place]  [place].  No smoking, alcohol, or illicit drug use.  She uses a cane to  ambulate.   FAMILY HISTORY:  Positive for coronary artery disease with diabetes and  hypertension.   REVIEW OF SYSTEMS:  GENERAL:  System positive for weakness, malaise.  HEENT:  Unremarkable.  CARDIOVASCULAR:  As in HPI.  RESPIRATORY:  As in  HPI.  GASTROINTESTINAL:  Positive for nausea, otherwise unremarkable.  GENITOURINARY:  Unremarkable.  NEUROLOGIC:  Unremarkable.  PSYCHIATRIC:  Unremarkable.  DERMATOLOGIC:  Unremarkable.  MUSCULOSKELETAL:  Unremarkable.  Other systems unremarkable.   PHYSICAL EXAMINATION:  VITAL  SIGNS:  Temperature 97, blood pressure  120/60, heart rate 73, respiratory rate 16, saturation 100% on room air.  GENERAL:  Overweight, black female in no distress.  HEENT:  There is no pallor.  No icterus.  Oral mucous membrane is moist.  No oral lesions are noted.  NECK:  Soft and supple.  No thyromegaly is appreciated.  LUNGS:  Clear to auscultation bilaterally.  No wheezing, rales, or  rhonchi.  CARDIOVASCULAR:  S1/S2 is normal, regular.  No murmurs appreciated.  No  S3, S4, no rubs, no bruits.  No tenderness to palpation was elicited on  palpation of the chest wall.  ABDOMEN:  Soft, nontender, nondistended.  Bowel sounds are present.  No  masses or organomegaly are appreciated.  RECTAL:  Exam was deferred.  GENITOURINARY:  Exam deferred.  MUSCULOSKELETAL:  Unremarkable.  NEUROLOGIC:  She has subtle left-sided weakness, otherwise no other  deficits present.   LABORATORY DATA:  Her CBC shows a hemoglobin of 9.6 with MCV of 72,  platelet count is 270.  Her hemoglobin is at baseline.  Her sodium is  134, glucose is 138.  LFTs are normal.  Lipase is normal.  Cardiac panel  x1 was negative.  UA did now show any infection.   Her chest x-ray reveals mild left basilar atelectasis, stable borderline  cardiomegaly, and minimal chronic bronchitic changes.   EKG:  There are two available.  The first one shows sinus rhythm with  what appears to be some kind of anomaly with lead I.  I think the leads  had not been properly placed here.  The subsequent EKG shows a sinus  rhythm with a normal axis. Intervals appear to be in the normal range.  No Q-waves are present.  No ST or T-wave changes of concern are noted on  this EKG.   ASSESSMENT:  This is a 60 year old black female who has a medical  history as stated earlier and presents with chest tightness this  morning.  Her history of coronary artery disease is not very clear.  It  appears she has had invasive and noninvasive testing and these  and these  have not been positive in the past.  However, this pain  requires further  evaluation at this time.  Differential diagnoses include coronary artery  disease, GI, musculoskeletal in etiology.  She does have risk factors  for CAD.   PLAN:  1. Chest pain.  We will admit her to the hospital to rule her out for      acute coronary syndrome with serial cardiac enzymes.  We will try      to obtain reports of angiograms and stress test from [facility].  We will also consult cardiologist to      evaluate this patient.  Plavix will be continued.  Beta blockers      will be continued.  No anticoagulation at this time.  2. History of sleep apnea.  Continue with CPAP.  3. Diabetes.  Continue with her Avandia and put her on sliding scale.  4. Hypertension.  Continue with her current antihypertensive aid      regimen.  5. History of chronic migraine headaches.  She was recently taken off      of Topamax.  She was put on fentanyl patch.  The patient tells me      that she had an MRI done by her neurologist recently.  We will      continue with her current medications for now.  6. Osteoporosis, stable.  7. History of bipolar disorder, stable.  8. Fasting lipid profile will be checked.  EKG has been repeated.   Further management decisions will depend on results of further testing  and patient's response to treatment.      Osvaldo Shipper, MD  Electronically Signed     GK/MEDQ  D:  02/02/2008  T:  02/03/2008  Job:  [id number]   cc:   Milus Mallick. Lodema Hong, M.D.  Fax: [phone]   [facility]  [place], [place]

## 2010-07-26 NOTE — Discharge Summary (Signed)
NAME:  [name], [name]                  ACCOUNT NO.:  [id number]   MEDICAL RECORD NO.:  [id number]          PATIENT TYPE:  OBV   LOCATION:  A337                          FACILITY:  [facility]   PHYSICIAN:  Skeet Latch, DO    DATE OF BIRTH:  1950-06-17   DATE OF ADMISSION:  09/19/2007  DATE OF DISCHARGE:  07/10/2009LH                               DISCHARGE SUMMARY   PRIMARY CARE PHYSICIAN:  Dr. Lodema Hong.   DISCHARGE DIAGNOSES:  1. Hypotension, resolved.  2. Acute renal insufficiency, resolved.  3. Chronic abdominal pain and nausea.  4. Irritable bowel syndrome.  5. History of bipolar disorder.  6. History of type 2 diabetes.   BRIEF HOSPITAL COURSE:  This is a 60 year old African American female  who presented with hypotension and mild diarrhea from a rest home.  The  patient was found to be hypotensive, having some diarrhea at the rest  home and they decided to bring her in and be evaluated.  Upon being seen  in the emergency room the patient's blood pressure improved with IV  hydration.  By the time she was on the floor her blood pressure was  120/73.  Initial labs showed a troponin that was unremarkable.  Urinalysis was unremarkable.  She was anemic at 9.7.  Platelet count  225, sodium 135, potassium 5.1.  Her BUN was 33, creatinine 3.01.  Chest  x-ray showed low lung volumes with bibasilar atelectasis or early  infiltrate.  The patient was admitted to a general medical bed.  She was  continued on IV hydration.  Her blood pressure has been maintained in  normal range.  Renal function has improved to normal range after IV  hydration overnight.  She was placed on sliding scale and placed on her  home medications for diabetes as well as medications for her bipolar  disorder.  The patient has had any complaints of chest pain, abdominal  pain or urinary complaints during her hospital stay.  Her chest x-ray  was repeated this morning and showed 1) mild cardiomegaly, 2) bronchitic  changes  with bibasilar atelectasis.  At this time the patient is stable  enough to be sent back to the rest home.   MEDICATIONS:  1. Will include promethazine 25 mg q.4-6 hours as needed.  2. Metoprolol 12.5 mg twice a day.  3. Zetia 10 mg once a day.  4. Singulair 10 mg once a day.  5. Omeprazole 20 mg once daily.  6. Plavix 75 mg once daily.  7. Zoloft 20 mg once a day.  8. Glucophage 500 mg twice daily.  9. Lovastatin 80 mg at bedtime.  10.Lisinopril 10 mg once a day.  11.Hyoscyamine 0.125 mg a.c. and at bedtime.  12.Gabapentin 300 mg three times a day.  13.NitroQuick 0.4 mg as needed.  14.Lorazepam 0.5 mg twice daily.  15.Topiramate 25 mg twice daily.   DISCHARGE VITALS:  Temperature is 97.9, pulse 81, respirations 20, blood  pressure 85/56.  She is satting 94% on room air.  We will repeat her  blood pressure.   DISCHARGE LABS:  White  count 6.4, hemoglobin 8.8, hematocrit 28.6,  platelet count 178, sodium 139, potassium 4.7, chloride 111, CO2 24,  glucose 172, BUN 15, creatinine 0.84.   CONDITION ON DISCHARGE:  Stable.   DISPOSITION:  The patient will be discharged back to the rest home.   DISCHARGE INSTRUCTIONS:  The patient to maintain an 1800-2000 diabetic  diet.  She is to resume her normal activities.  The patient is to follow  up with Dr. Lodema Hong within the next 5-7 days.      Skeet Latch, DO  Electronically Signed     SM/MEDQ  D:  09/20/2007  T:  09/20/2007  Job:  [id number]   cc:   Dr Lodema Hong

## 2010-07-26 NOTE — Group Therapy Note (Signed)
NAME:  [name], [name]                  ACCOUNT NO.:  [id number]   MEDICAL RECORD NO.:  [id number]          PATIENT TYPE:  INP   LOCATION:  A318                          FACILITY:  [facility]   PHYSICIAN:  Margaretmary Dys, M.D.DATE OF BIRTH:  [birth year]   DATE OF PROCEDURE:  07/06/2008  DATE OF DISCHARGE:                                 PROGRESS NOTE   SUBJECTIVE:  The patient is still much better today, much more awake and  alert, following commands.  The patient's appetite is good.  Headache is  gone.  Her back pain is still 6/10 although she said this is not very  different from what she normally is at the assisted-living.   OBJECTIVE:  GENERAL:  Conscious, alert, comfortable, not in acute  distress.  Well-oriented to time, place and person.  VITAL SIGNS:  Blood  pressure is 118/62, pulse of 76, respirations 20, temperature 98.4  degrees Fahrenheit, oxygen saturation 100% on room air.  HEENT:  Normocephalic, atraumatic.  Oral mucosa was moist.  No exudates  were noted.  NECK:  Supple.  No JVD or lymphadenopathy.  LUNGS:  Clear clinically.  Good air entry bilaterally.  HEART:  S1 and S2 regular.  No S3, S4, gallops or rubs.  ABDOMEN:  Abdomen was soft, nontender.  Bowel sounds positive.  No  masses palpable.  EXTREMITIES:  No edema.  CNS:  The patient was much more awake and following commands now.   LABORATORY DATA:  White blood count 6.1, hemoglobin 9.5, hematocrit of  30, platelet count was 177,000 with no left shift.  Sodium is 148,  potassium is 4.3, chloride of 108, CO2 was 25, glucose is 147, BUN 25,  creatinine  0.95, calcium is 9.1.   ASSESSMENT/PLAN:  1. Altered mental status, transient.  2. History of polypharmacy likely causing #1 above.  Patient on      multiple narcotics and also benzodiazepines which the patient      insists on continuing to take.  She has concerns about withdrawal      from them.  3. History of type 2 diabetes with fair control.   PLAN:  1.  Continue all her medications.  2. I think we can transfer her out of the intensive care unit.  3. We can begin to make arrangements to have her go back to the      assisted-living facility in the next 24 hours.     Margaretmary Dys, M.D.  Electronically Signed    AM/MEDQ  D:  07/06/2008  T:  07/07/2008  Job:  [id number]

## 2010-07-26 NOTE — Op Note (Signed)
NAME:  [name], [name]                  ACCOUNT NO.:  [id number]   MEDICAL RECORD NO.:  [id number]           PATIENT TYPE:   LOCATION:                                 FACILITY:   PHYSICIAN:  Georgia Lopes, M.D.  DATE OF BIRTH:  [birth year]   DATE OF PROCEDURE:  09/01/2008  DATE OF DISCHARGE:                               OPERATIVE REPORT   PREOPERATIVE DIAGNOSIS:  Maxillary and mandibular atrophy.   POSTOPERATIVE DIAGNOSIS:  Maxillary and mandibular atrophy.   PROCEDURE:  Split-thickness skin graft from right thigh to mandible,  vestibuloplasty of mandible with lowering the floor of the mouth,  insertion of oral surgical splint with circummandibular wires, maxillary  submucous vestibuloplasty with placement of hydroxyapatite graft.   SURGEON:  Georgia Lopes, MD   ANESTHESIA:  General.   INDICATIONS FOR PROCEDURE:  Delta is a 60 year old black female who has  been edentulous for a number of years because of normal atrophy.  There  is an adequate bone or vestibular depth left for her to wear denture.  Implants were not an option for financial reason, so the patient was  scheduled for this procedure.   PROCEDURE:  The patient was taken to the operating room, placed on the  table in the supine position, and intravenous medication was  administered to attain a state of general anesthesia.  The nasal  endotracheal tube was placed; however, the cuff was perforated during  intubation, so the new nasal endotracheal tube was placed and secured.  Th eyes were protected.  The patient was then prepped and draped.  The  first OpSite was at the right thigh.  Using a dermatome with a 3-inch  blade, approximately 3-inch x 5-inch skin graft was taken.  Then, the  area was dressed with Tegaderm dressing x2, and this area was then  covered through the remainder of the case in the oral cavity.  The  posterior pharynx was suctioned.  A throat pack was placed.  Lidocaine  2% with 1:100,000  epinephrine was infiltrated, and a right and left  inferior alveolar block and a buccal and palatal infiltration in the  maxilla, total of 10 mL was used.  Then, a #15 blade was used to make a  circumferential incision beginning at the left molar region continuing  to the right molar region on the buccal surface of the alveolar ridge at  the junction of the attached gingiva and the loose alveolar mucosa.  Dissection was carried down supraperiosteally until approximately 2 cm  of bone was exposed on the buccal surface of the anterior mandible and  was tapering back into the initial incision in the posterior regions.  Then, the 15 blade was used to make a full-thickness incision on the  lingual aspect of the mandible from molar to molar.  Dissection was  carried down subperiosteally to expose the lingual surface of the bone  and thus lower the floor of the mouth.  Then, the oral surgical splint  was tried in the mouth, and using dental compound sticks, adaptation was  made to adapt the  splint to the new vestibular depth so that the skin  graft can be attached to it.  The mandibular awl was used to suture the  floor of the mouth to the loose buccal mucosa on the buccal side of the  mandible to create a trough on both the lingual and buccal sides.  Total  of 3 circummandibular sutures were used.  Then, the skin was placed into  the custom-fitted mandibular splint and placed in the mouth.  A 24-gauge  wire were used with the dental awl to perform 3 circummandibular wires  to ligate the splint into position in the anterior region midline and in  the right and left posterior premolar region.  Then, attention was  turned to the maxilla.  A 15 blade was used to make a vertical incision  approximately 1.5 cm above the attached.  A submucous dissection was  carried out with tenotomy scissors on the right and left sides and then  a subperiosteal dissection was performed with periosteal elevator.  The   muscular and submucosal tissue was then removed using our scissors and  rongeurs.  Then, the hydroxylapatite was placed.  Total of 3 vials of HA  was used and placed in the previously dissected area which had been  dissected palatal to the alveolar ridge.  Attempt was made to pack the  hydroxylapatite in place with HA packer.  Then, after suitable anatomy  and contour was achieved by bimanual palpation and manipulation, the  area was then closed with 3-0 Vicryl sutures horizontal mattress.  The  oral cavity was then irrigated copiously, suctioned, throat pack was  removed.  The patient was awakened, taken to the recovery room breathing  spontaneously in good condition.   ESTIMATED BLOOD LOSS:  Minimum.   COMPLICATIONS:  None.   SPECIMENS:  None.      Georgia Lopes, M.D.  Electronically Signed     SMJ/MEDQ  D:  09/01/2008  T:  09/02/2008  Job:  [id number]

## 2010-07-26 NOTE — H&P (Signed)
NAME:  [name], [name]                  ACCOUNT NO.:  [id number]   MEDICAL RECORD NO.:  [id number]          PATIENT TYPE:  OBV   LOCATION:  A323                          FACILITY:  [facility]   PHYSICIAN:  [name] Shipper, MD     DATE OF BIRTH:  07-Jun-1950   DATE OF ADMISSION:  07/14/2008  DATE OF DISCHARGE:  LH                              HISTORY & PHYSICAL   The patient's PMD is Dr. Syliva [name].  Based on previous records,  it appears that her cardiologist used to be Dr. Domingo [name] with  [facility]   ADMISSION DIAGNOSES:  1. Chest pain. likely pleuritic.  2. Type 2 diabetes.  3. Chronic pain syndrome.  4. Chronic migraine headaches.  5. Hypertension, poorly controlled.  6. History of depression.  7. History of stroke.  8. History of psychiatric ailments resulting in poor history.   CHIEF COMPLAINT:  Chest pain.   HISTORY OF PRESENT ILLNESS:  The patient is an 60 year old African-  American female who is well-known to our service for numerous  admissions.  She was last admitted just this past month on April 25 for  altered mental status as a result of polypharmacy.  She was discharged  after a two-day stay.  She comes in today with complaints of her  retrosternal chest pain which radiates to the back.  The patient  consciousness is waxing and waning, and hence history is very, very  limited.  She mentions that the pain is squeezing-type pain.  She is  unable to quantify it.  She tells me that the pain gets worse with deep  breathing.  She had been coughing up some sputum.  She is unable to tell  me if she had any fever or chills.  Denies any nausea, vomiting.  Denies  any abdominal pain.  The pain started about 3-4 days ago and has been  getting worse progressively, so unfortunately because of her mental  status, full history is not available.  She lives in assisted living  facility.   MEDICATIONS:  We just got this list from the assisted living facility,  and she is on the  following:  1. Clonidine 0.1 mg b.i.d.  2. Neurontin 600 mg daily.  3. Dicyclomine 20 mg t.i.d.  4. Glipizide XL 2.5 mg daily.  5. Evoxac 30 mg daily.  6. Lamotrigine 1 tablet based on previous records; she is on 100 mg      daily.  7. Metoprolol 25 mg b.i.d.  8. Lisinopril 10 mg daily.  9. Omeprazole 20 mg daily.  10.Lorazepam 1 mg daily and 2 mg at bedtime.  11.Plavix 75 mg daily.  12.Sertraline 200 mg daily.  13.Singulair 10 mg daily.  14.Lovastatin 80 mg at bedtime.  15.Mirtazapine 30 mg at bedtime.  16.Ambien 10 mg at bedtime.  17.She is on CPAP at night.  18.She is on a fentanyl patch 50 mcg q.72 h.   ALLERGIES:  Include KEFLEX, PENICILLIN, PYRIDIUM, AND CASTOR OIL.   PAST MEDICAL HISTORY:  Obtained from previous records consists of:  1. Sleep apnea on CPAP.  2. Diabetes  type 2.  3. Hypertension.  4. Chronic migraine headaches.  5. History of stroke with left-sided weakness.  6. History of chronic pain syndrome.  7. History of bipolar disorder, possible schizophrenia.   SOCIAL HISTORY:  Lives in assisted living facility.  According to  previous records, she is able to ambulate somewhat by herself but no  history of any smoking or alcohol use or any illicit drug use.   FAMILY HISTORY:  Positive for diabetes and coronary artery disease.   REVIEW OF SYSTEMS:  Unable to do because of mental status.   PHYSICAL EXAMINATION:  VITAL SIGNS:  Temperature 98.3, blood pressure  149/75 right arm - 137/60 left arm, heart rate in the 70s, respiratory  rate 20, saturation 98% on room air - I was told that was dropping to  85% when she would go off to sleep.  GENERAL EXAM:  Obese, African American female with waxing and waning  mental status.  When I talk to her, she tends to wake up and then goes  right back to sleep.  She appears to be in no distress.  HEENT:  There is no pallor, no icterus.  Oral mucous membrane is moist.  No oral lesions are noted.  NECK:  Soft and supple.   No thyromegaly is appreciated.  LUNGS:  Clear to auscultation bilaterally.  No wheezing, rales or  rhonchi.  CARDIOVASCULAR:  S1-S2 is normal, regular.  No S4.  No rubs.  No  murmurs.  No bruits.  No JVD.  Peripheral pulses are palpable.  No edema  is noted.  ABDOMEN:  Is soft, nontender, nondistended.  Bowel sounds are present.  No masses or organomegaly appreciated.  CHEST WALL:  There was some tenderness to palpation, but it is unclear  if her pain was reproduced.  GENITOURINARY EXAMINATION:  Deferred.  MUSCULOSKELETAL:  Unremarkable.  NEUROLOGICAL:  She is alert and oriented x3 apart from mild left-sided  weakness.  No other deficits present.  SKIN EXAM:  Revealed no erythema, no calf tenderness.  No sacral  decubitus.   LABORATORY DATA:  CBC shows a white count 6.1, hemoglobin 10.7, platelet  count is 250.  D-dimer 0.31.  MCV 73.  Her electrolytes are normal,  glucose 184.  LFTs are normal.  Lipase is normal.  Cardiac enzymes  negative x1.   She had a chest x-ray which did not show any active disease.  Mediastinum was normal.  Of note, she had had a CT angio in June of 2009  which showed a normal aorta at that time.  No PE was noted.   EKG shows a sinus rhythm with a normal axis.  Intervals appear to be in  the normal range.  No QRS prolongation.  No Q-waves.  No ST elevation or  depression.  No concerning T-wave changes.   ASSESSMENT:  This is a 60 year old African American female who presents  with chest pain.  She unfortunately is a poor historian.  Based on what  she is able to tell me, it looks like this could be pleuritic, but she  does state that the pain radiates to the back which is a little bit of a  concerning history.   Differential diagnoses of the chest pain include pleuritic pain,  musculoskeletal chest wall pain, aortic dissection, coronary artery  disease.  PE is less likely considering a normal D-dimer.  GERD could  also be causing this, though she does  not have any symptoms of  heartburn.  PLAN:  1. Chest pain.  We will admit her to the hospital, rule her out for      acute coronary syndrome.  I will get a CT angio to rule out aortic      dissection.  She has been seen by [facility] in the      past, although based on our records here does not appear that she      has had any stress testing or cardiac catheterization at least      based on the echart records.  [facility] may have      other records.  EKG will be repeated.  Cardiology will be      consulted.  Continue with Plavix, beta blocker.  She may benefit      from an echocardiogram as well.  Lipid panel will be checked.  2. Diabetes type 2.  Continue glipizide.  Put her on CBGs q.a.c. and      h.s.  HbA1c will be checked.  3. History of sleep apnea.  Continue with CPAP.  4. Chronic narcotics.  We will have to monitor her mental status      closely.  I will not prescribe her any more parenteral narcotics      for now.  She received 4 mg of morphine in the ED.  5. Hypertension.  Continue with all of her antihypertensive      medications for now.  6. Chronic migraine headaches.  Continue with Lamictal.  7. History of bipolar disorder and possible schizophrenia, stable.      She does not appear to be on any kind of psychotropic medications,      except for Neurontin.  8. DVT prophylaxis will be initiated.   Further management decisions will depend on results of further testing  and patient's response to treatment.      [name] Shipper, MD  Electronically Signed     GK/MEDQ  D:  07/15/2008  T:  07/15/2008  Job:  [id number]   cc:   Lodema Hong, M.D.

## 2010-07-27 ENCOUNTER — Other Ambulatory Visit: Payer: Self-pay

## 2010-07-27 MED ORDER — NYSTATIN 100000 UNIT/GM EX POWD: CUTANEOUS | Status: DC

## 2010-07-29 NOTE — Group Therapy Note (Signed)
NAME:  Dipinto, Willodene                  ACCOUNT NO.:  [id number]   MEDICAL RECORD NO.:  [id number]          PATIENT TYPE:  INP   LOCATION:  A304                          FACILITY:  [facility]   PHYSICIAN:  Margaretmary Dys, M.D.DATE OF BIRTH:  [birth year]   DATE OF PROCEDURE:  01/21/2006  DATE OF DISCHARGE:                                   PROGRESS NOTE   SUBJECTIVE:  Patient feels much better today, although she continues to  complain of severe back pain, which she rates at a 10/10.  Patient denies  taking any additional narcotic analgesia.  She was admitted with altered  mental status with concern that Skelaxin was interacting with the Duragesic  patch and probably an unintentional overdose of some of her narcotic  analgesia.   OBJECTIVE:  GENERAL:  Conscious, alert, oriented to time, place and person,  not in acute distress.  VITAL SIGNS:  Blood pressure 124/66, pulse of 69.  Respiration is 16,  temperature 98.3.  Blood sugars are ranging between 111 to 188.  Oxygen  saturation was 98% on room air.  HEENT:  Normocephalic, atraumatic.  Her oral mucosa was moist with no  exudates.  NECK:  Supple, no JVD, no lymphadenopathy.  LUNGS:  Clinically clear, good air entry bilaterally.  HEART:  S1, S2, regular.  ABDOMEN:  Soft, nontender, bowel sounds positive.  EXTREMITIES:  No edema.   LABORATORY/DIAGNOSTIC DATA:  Really unremarkable.   ASSESSMENT/PLAN:  1. History of altered mental status, likely drug induced.  Patient is      doing much better now.  2. Although she continues to complain of severe back pain, pain control is      with non-steroidal anti-inflammatory agent and some occasional      narcotics as needed.  Patient did give an incoherent history about      consult with a back surgeon a few months ago.  The records of this are      not available.  We need to check with Syliva Overman M.D. regarding      this.  Patient has had multiple admissions in the past.  It is unclear  what her home situation is, although patient says she lives with her      son who takes care of her.  I think we will need social services to      help with evaluating her home situation and probable need  for      placement if indicated.  We will continue all other medications at this      time.      Margaretmary Dys, M.D.  Electronically Signed     AM/MEDQ  D:  01/21/2006  T:  01/21/2006  Job:  [id number]

## 2010-07-29 NOTE — Discharge Summary (Signed)
NAME:  [name], [name]                  ACCOUNT NO.:  [id number]   MEDICAL RECORD NO.:  [id number]          PATIENT TYPE:  INP   LOCATION:  A304                          FACILITY:  [facility]   PHYSICIAN:  Osvaldo Shipper, MD     DATE OF BIRTH:  April 19, 1950   DATE OF ADMISSION:  01/19/2006  DATE OF DISCHARGE:  11/12/2007LH                               DISCHARGE SUMMARY   PRIMARY CARE PHYSICIAN:  Milus Mallick. Lodema Hong, M.D.   DISCHARGE DIAGNOSES:  1. Altered mental status secondary to benzodiazepines, improved.  2. Chronic back pain of unclear etiology.  3. Left flank pain of unclear etiology.  4. Chronic anemia.  5. Hypertension.  6. Dyslipidemia.   Please see the H&P dictated by Dr. Gasper Sells for details regarding  patient's presenting illness.   BRIEF HOSPITAL COURSE:  This is a 60 year old African-American female  who lives at home with her son and who has been having some left flank  pain for over a week for which she is following with her primary care  physician.  She presented to the ED on November 9 with complaints of  pain on the left flank side.  However, she was found to be in altered  mental status.  Hence, our service was called to admit the patient for  observation.   When the patient was admitted, she was unable to give any history.  History was obtained from the ED records.  It was felt that her altered  mental status was likely from the benzodiazepines she was taking at home  including Ativan and Skelaxin.  In any case, the patient was admitted to  the hospital, and slowly her mental status got better.  Today she is  completely alert and oriented.  She ambulates with the help of a cane.  She absolutely denies taking an overdose of any medications.  She says  she was supposed to be on a Duragesic patch for her back pain; however,  she herself discontinued it for unclear reasons.  In a sense, we do not  have a good idea of why she had alteration in mental status, but it  most  likely was secondary to medications.  Her CT head was negative.  There  were no focal neurological deficits.   Left flank pain was also of unclear etiology.  Apparently this was being  worked up by her primary care physician.  She had a CT scan of her  abdomen and pelvis on November 8 which was negative for any acute  abdominal process.  Some degenerative and postoperative changes were  noted.  Degenerative disease of the lumbar spine was noted.  CT pelvis  was negative. No abnormalities noted in the kidneys including a kidney  stone.  A UA done at time of admission also did not show any blood in  the urine nor any evidence for infection.  She continues to have some  flank pain today as well.  However, she is able to eat okay, denies any  nausea, vomiting, denies any diarrhea.  I think her issues are chronic.  Based  on previous records, she has chronic back pain for which no clear  etiology has been found despite many evaluations in the past.  I told  her that she needs to talk to her primary care physician about her pain  issues.  I told her that she may take Tylenol as needed for her pain  control.  If she does need narcotic medications, I have asked her to  contact her primary care physician at this time.   She did have low hemoglobin of 9.7 on presentation which remained  stable.  She does have a history of chronic anemia.   Urine drug screen was positive just for benzodiazepines.  Otherwise, she  had unremarkable blood work.  She did have an x-ray of her lumbar spine  which was negative for any acute findings, just showed degenerative  changes and spondylytic changes.  She  had pelvic films which were  negative as well.   I had an extensive discussion with the patient regarding all of her  issues today.  There was a question as to whether or not the patient  would need to go to a nursing home because of her inability to care for  herself at home.  However, she told me  emphatically that she would not  be willing to go to any nursing home, and she told me that her son was  with her most of the time, and she also mentions that she gets aid at  home as well as home health including an RN who helps with her  medications.  I think the patient is capable of making her own  decisions, and at this time we will respect her choice.   DISCHARGE MEDICATIONS:  I told her not to take Skelaxin more than 3  times a day.  I also told her not to take the Ativan she is on more than  3 times a day and then only as needed. I told her that she needs to  continue her other outpatient medications as before.  I have also told  her to follow with a primary care physician within a week to discuss her  left flank pain.   DIET:  Heart healthy diet.   ACTIVITY:  She will use a cane to ambulate.  I did walk the patient  today myself, and she appeared to be stable with the help of the cane.   CONSULTATIONS:  No consultations were obtained during this admission.   Total time spent at discharge: 35 minutes.      Osvaldo Shipper, MD  Electronically Signed     GK/MEDQ  D:  01/22/2006  T:  01/22/2006  Job:  [id number]   cc:   Milus Mallick. Lodema Hong, M.D.  Fax: [phone]

## 2010-07-29 NOTE — H&P (Signed)
NAME:  [name], [name]                  ACCOUNT NO.:  [id number]   MEDICAL RECORD NO.:  [id number]          PATIENT TYPE:  INP   LOCATION:  A322                          FACILITY:  [facility]   PHYSICIAN:  Skeet Latch, DO    DATE OF BIRTH:  Mar 05, 1951   DATE OF ADMISSION:  05/01/2006  DATE OF DISCHARGE:  LH                              HISTORY & PHYSICAL   PRIMARY CARE PHYSICIAN:  Milus Mallick. Lodema Hong, M.D.   CHIEF COMPLAINT:  Altered mental status.   HISTORY OF PRESENT ILLNESS:  This is a 60 year old African American  female without presents with complaints of altered mental status  changes.  The patient's son is at bedside who gives history.  Apparently, the patient was seen at [facility] today for a steroid  injection for lower back due to chronic back pain.  The patient was  unable to get this injection because the patient was too lethargic.  The  patient was told that her blood was too low and she needed to be  evaluated by a physician.  The patient's son brought the patient back to  [place] to be evaluated.  The patient's son states that he attempted  to call her primary care physician's office who is Dr. Lodema Hong and was  told that she had an appointment in the morning.  The patients' son then  brought the patient to [facility] for evaluation.  The  patient's son also states the patient has been lethargic for the past  few days and has had a hard time keeping her awake.  We asked the  patient about her medications and she states that she only takes her  medications that are required.  She has a long list of medications.  The  patient is on some chronic pain medications for her back.  The patient  states that she was on a Fentanyl patch, but she has not been taking it  regularly.  She only takes it when she really needs it.  The patient was  admitted to the hospital back in November 2007 for similar episode of  altered level of consciousness, and I believe at that time  it was  thought to be due to some of her medications that she was taking.  On  exam, the patient seems to be more alert and awake and will answer  questions appropriately, and is giving history.   PAST MEDICAL HISTORY:  1. Non-insulin-dependent diabetes.  2. Hypertension.  3. Bipolar disorder.  4. Diverticulitis.  5. Glaucoma.  6. Migraines.  7. Sleep apnea.  8. Stroke.  9. Chronic back pain.   FAMILY HISTORY:  Positive for coronary artery disease, diabetes,  hypertension.   PAST SURGICAL HISTORY:  Carpal tunnel surgery, cholecystectomy,  hysterectomy and breast reduction.   SOCIAL HISTORY:  Denies any alcohol or drug use, is a former smoker.   ALLERGIES:  PENICILLIN, PYRIDIUM, KEFLEX.   CURRENT MEDICATIONS:  1. Ambien 10 mg q.h.s.  2. Ativan 1 mg q.i.d.  3. Auralgan Otic two drops t.i.d.  4. Bentyl 20 mg t.i.d.  5.  Clonidine 0.1 mg at bedtime.  6. Depakote 250 mg t.i.d.  7. Gabapentin 300 mg t.i.d.  8. Lisinopril 10 mg once a day.  9. Metoprolol 12.5 mg twice a day.  10.Nystatin topical to affected areas twice a day.  11.Phenergan oral 25 mg b.i.d.  12.Plavix 75 mg once daily.  13.Prilosec 20 mg once daily.  14.Singulair 10 mg once daily.  15.Skelaxin 800 mg one half to one tablet at bedtime.  16.Tamiflu 75 mg b.i.d.  17.Topamax 25 mg b.i.d.  18.Tussionex one teaspoon at bedtime.  19.Zetia 10 mg one tablet Monday, Wednesday, Friday and Sundays.  20.Zoloft 200 mg every morning.  21.Mupirocin topical each nostril at bedtime.  22.The patient states that she takes a Duragesic patch unknown      strength p.r.n.   REVIEW OF SYSTEMS:  HEENT:  Negative.  CARDIOVASCULAR:  Denied any chest pain.  RESPIRATORY:  Denied any cough, dyspnea or wheezing.  GI:  Denies any nausea, vomiting, abdominal pain.  GU:  Denies any dysuria or flank pain or incontinence.  MUSCULOSKELETAL:  Positive for chronic back pain.  SKIN:  Negative for any rashes.  NEUROLOGICAL:  Positive for  fatigue and weakness.  Denies any dizziness  right now.   PHYSICAL EXAMINATION:  VITAL SIGNS:  Temperature 98.1, respirations 20,  pulse 97, blood pressure 100/58.  HEENT:  Normocephalic, atraumatic.  Eyes:  PERRLA.  EOMI.  Ears:  Bilateral cerumen impaction bilaterally.  NECK:  Supple.  No thyromegaly.  No lymphadenopathy appreciated.  CARDIOVASCULAR:  Regular rate and rhythm.  No murmurs, rubs or gallops.  RESPIRATORY:  Lungs are clear to auscultation bilaterally.  No rhonchi,  rales or wheezes.  ABDOMEN:  Soft, nontender, nondistended, positive bowel sounds.  EXTREMITIES:  No cyanosis, clubbing or edema.  NEUROLOGICAL:  Cranial nerves II-XII were grossly intact.  The patient  is a little lethargic, awake and alert, oriented x3.  SKIN:  No rashes or petechiae noted.   LABORATORY DATA:  White blood cell count 9.3, hemoglobin 11.8,  hematocrit 37.0, platelets 272.  Sodium 132, potassium 3.6, chloride  101, CO2 20, glucose 114, BUN 76, creatinine 1.93, calcium 8.4.  The  patient had CT of her head which was negative.  Chest x-ray:  Results in  progress.  Urine drug screen was positive for benzodiazepines, positive  for opiates.   ASSESSMENT/PLAN:  1. Altered mental status.  Mental status appears to be improving with      IV hydration at this point.  She is lethargic, but she is alert and      oriented x3 and will answer commands appropriately.  I do not      notice any defects, slurring of speech or motor defects at this      time.  Son states that the patient has done this before, and after      admission the last few days, the patient recovered.  The patient is      on multiple pain medications as well as medication for bipolar      disorder.  I am not sure if this is related to her multiple      medications or due to her acute renal insufficiency at this time.     I will hold her Ambien and Ativan and any narcotics at this time.      Will write for oxycodone only to be given if  needed.  The patient's      medications will be adjusted after discharge.  2. Acute renal insufficiency.  Will maintain the patient on IV      hydration at this time.  Will repeat a CMP in the a.m.  If this      continues to show renal insufficiency, the patient may need a      nephrology consult.  I will get a renal ultrasound of bilateral      kidneys to rule out any obstruction at this time.  I will hold on      her Lisinopril at this time.  3. The patient does have chronic anemia.  It seems to be stable at      11.8.  This also will be repeated in the a.m.  I will not do      anything further with her hemoglobin status, at this point.  Of      note, I will not totally discontinue her Ativan.  Will decrease the      dose and schedule at this time.      Skeet Latch, DO  Electronically Signed     SM/MEDQ  D:  05/01/2006  T:  05/02/2006  Job:  [id number]   cc:   Milus Mallick. Lodema Hong, M.D.  Fax: [phone]

## 2010-07-29 NOTE — H&P (Signed)
NAME:  [name], [name]                            ACCOUNT NO.:  [id number]   MEDICAL RECORD NO.:  [id number]                   PATIENT TYPE:  EMS   LOCATION:  ED                                   FACILITY:  [facility]   PHYSICIAN:  Vania Rea, M.D.              DATE OF BIRTH:  03-04-1951   DATE OF ADMISSION:  08/09/2003  DATE OF DISCHARGE:                                HISTORY & PHYSICAL   PRIMARY. CARE PHYSICIAN:  Milus Mallick. Lodema Hong, M.D.   CHIEF COMPLAINT:  Acute on chronic dizziness, drowsiness, and staggering.   HISTORY OF PRESENT ILLNESS:  This is a 60 year old, obese, African-American  lady with history of diabetes, hypertension, hyperlipidemia, bipolar  disorder on multiple medications, who has episodes of dizziness and  staggering and drowsiness and who usually just lies down and sleeps it off.  These episodes have been coming on for years.  However, the patient had an  episode today which was similar to other episodes except that she felt much  worse.  It started in the morning before breakfast.  She had a fruit  breakfast, and there was no change in her symptoms.  She went to church  anyway and acted as an Ship broker.  She did not feel well throughout the service  but nevertheless did not voice complaint.  She felt weak and staggering.  She came home, still felt ill, checked her blood sugar, and it was  reportedly 100.  She had lunch and continued to feel sleepy and drowsy and  eventually lay down to sleep and asked her relatives to bring her to the  emergency room.   In the emergency room, a CT scan of the head was done which is suspicious  for an acute lacunar infarct in the inferior right lentiform nucleus.  The  patient also has sleep apnea but does not use her CPAP because it scares her  grandson.  The patient also has irritable bowel syndrome manifested by  diarrheal stools daily, about four to five watery stools per day.  The  patient takes Lomotil p.r.n. for this, last  Lomotil yesterday.  The patient  has been taking two new medications recently: Levbid and Phenergan.  She  took a dose of Phenergan this morning and also a dose of the Levbid.   The patient's psychotropic medications include Zoloft, Restoril, Neurontin,  Topamax, and Ativan.  She has taken all of these this morning except the  Restoril which she took last night.   Her only anti-diabetic medication is Avandia which she took 4 mg this  morning.   The patient has been having no fever, cough, or cold.  No chest pain and no  shortness of breath.  She has had no frank syncope but tends to stagger and  fall to the left.   PAST MEDICAL HISTORY:  1. Hypertension since 1992.  2. Diabetes, type 2, since 1995.  3. Bipolar disorder.  4. COPD.  5. Obstructive sleep apnea.  6. Migraine.  7. Hyperlipidemia.  8. Episodic dizziness.  9. Irritable bowel syndrome.  10.      Diverticular disease by colonoscopy about a year ago.   MEDICATIONS:  1. Avandia 4 mg daily.  2. Nexium 40 mg daily for a hiatal hernia.  3. Aspirin 81 mg daily.  4. Topamax 25 mg in the morning, 50 mg in the evening.  5. Ativan 1 mg 4 times daily.  6. Restoril 30 mg at bedtime.  7. Neurontin 400 mg 4 times daily.  8. Depakote 500 mg at bedtime.  9. Phenergan 25 mg every 8 hours when necessary.  10.      Zoloft 200 mg daily.  11.      Levbid 1 tablet twice daily.  12.      Lisinopril 10 mg daily.  13.      Lipitor 20 mg at bedtime.  14.      Antivert 25 mg when necessary.  15.      Darvocet-N 100 mg daily when necessary for migraine.  16.      Lomotil when necessary for diarrhea.  17.      Calcium with D 600 mg 3 times daily.  18.      Actonel 35 mg each Friday.  19.      Albuterol and Atrovent inhaler when necessary for wheezing.   ALLERGIES:  KEFLEX and PYRIDIUM.   SOCIAL HISTORY:  She lives in her own home.  She has never been married.  She has one son, age 92, who lives with her.  She is a retired Neurosurgeon,   but she has been on medical disability for the past over 20 years because of  bipolar disorder.  She used to be a heavy abuser of tobacco but discontinued  completely about four years ago.  Denies any history of alcohol or drug use.   FAMILY HISTORY:  Significant for mother dying at around age 84 from acute  MI.  Mother had hypertension and diabetes.  Her father died in his 97s from  an acute MI but was also diabetic.  Her son is 57, is a Psychiatric nurse, and  has hypertension.  Her brother has hypertension and diabetes.  A sister died  from cancer of the pancreas in her 65s.   REVIEW OF SYSTEMS:  The patient has no previous history of stroke but does  have migraine headaches lasting about 2 to 3 days about once per month.  Does have sinusitis sometimes.  Has apparently been diagnosed with glaucoma  but is not on treatment.  No thyroid problems.  No problems related to  respiratory system and no problems related to cardiovascular system.  No  edema.  Does feel a choking sensation when she lies flat.  Does have  episodic nausea.  Does have diarrhea four to five times a day because of  irritable bowel.  Has occasional bloody stool.   PHYSICAL EXAMINATION:  GENERAL:  This is an obese long, toothpick looking  lady lying in the stretcher.  Very pleasant but very drowsy and has to be  aroused to answer questions.  VITAL SIGNS:  Temperature 97.3, blood pressure 106/53.  She said she did not  take her blood pressure medication this morning.  Her pulse is 105,  respirations 22.  Her pain scale is described as 9/10.  She says she is  having a headache.  Her capillary blood  sugar is 76.  HEENT:  Pupils are round, equal, and reactive to light.  She has immature  cataracts bilaterally.  Her mouth has mucous membranes which are markedly  dry.  She has upper and lower dentures. NECK:  No jugular venous distention, no lymphadenopathy, no thyromegaly.  CHEST:  Clear to auscultation bilaterally.   CARDIOVASCULAR:  Regular rhythm.  ABDOMEN:  Obese, soft, and mildly tender in the right lower quadrant.  There  is no organomegaly.  EXTREMITIES:  Thin but without edema.  She has 1+ pulses bilaterally.  NEUROLOGIC:  She is very drowsy, but she is alert and oriented to time and  place and herself.  Cranial nerves II-XII grossly intact.  Her power with  reinforced encouragement is grade 4 to 5 throughout.  She seems to be  somewhat stronger on the right.  She is right handed.  Sensation seems to be  intact throughout.  Her reflexes are brisk and equal throughout including  upper extremities, both knees and ankles.  Plantar's are downgoing.  Gait:  She walks and staggers slowly because she is drowsy.  When standing with her  eyes closed, she has a tendency to drift to the left.   LABORATORY AND X-RAY DATA:  CBC:  White count 7.9, hemoglobin 10.1,  hematocrit 30.8, MCV 70.5, platelet count 237.  Serum chemistry is  essentially normal except for markedly elevated BUN and creatinine ratio.  Sodium 139, potassium 3.9, chloride 112, CO2 29, glucose 97, BUN 31,  creatinine 0.7, calcium 8.6.  Her INR is 1.0.  Valproic acid level is  therapeutic at 73.  Her cardiac markers are completely normal with a  myoglobin of 56, troponin of less than 0.05, and a CK-MB less than 1.  Liver  function tests are essentially normal with a bilirubin 0.4, alkaline  phosphatase 75, AST 14, ALT 12, total protein 6.7, albumin 3.1.   INTERVENTION:  Because of the history of diabetes and the profound  drowsiness, although the blood sugar was 76 on capillary testing and 97 on  the CHEM-7, we gave her 0.5 amp D50 before walking her.  There was a  noticeable improvement in her level of awareness, and she became more wide  awake.   IMPRESSION AND PLAN:  1. Acute dizziness and drowsiness.  Possible causes include right lacunar     stroke as suggested by the CT scan. Will get an MRI to evaluate this.     However, the  patient did become more alert with glucose, and she may have     a relative hypoglycemia.  It is to be noted that patient said at one time     she was taking insulin, but she was taken off it because her hemoglobin     A1C became too low.  It may be that metformin is a more suitable drug for     this lady with a long toothpick appearance, the appearance of a metabolic     syndrome, and whose glucose control seems to be very good.  2. The patient is clinically dehydrated, and her BUN and creatinine ratio is     markedly elevated.  We will hydrate her.  Her pulse is elevated, and her     blood pressure is on the lower side of normal despite not having taken     her Lisinopril today.  3. She gives a history of gastrointestinal bleed, and she has a microcytic     anemia. This could also be  a reason for elevated BUN.  We will do an    anemia workup and will get GI people involved also.  4. Diabetes, type 2, is controlled.  We will withhold antidiabetic     medications for the time being and, if her blood sugar starts to rise, we     will favor metformin as a form of control  5. Hypertension is well controlled.  Because of suspecting stroke, we will     withhold antihypertensive medications, but it seems reasonable to     continue Lisinopril on discharge in this diabetic.  6. History of hyperlipidemia.  With history of taking her lipid drugs only     on alternate days, will get a fasting lipid panel, and we will likely     continue her on her Lipitor.  7. The patient has diagnosis of obstructive sleep apnea but does not use her     CPAP at home because it scares her 53-year-old grandson.  We will     reinstitute CPAP while patient is in hospital.  8. The patient has a history of bipolar disorder and is on multiple sedating     medications.  We will continue her on these medications for the time     being since she has become more alert with dextrose.  If we feel they are     causing excessive  drowsiness, we will withhold some.  Actually, maybe we     will reduce the dose of the Restoril for the time being since it is not     acutely necessary for her.  9. Her chronic obstructive pulmonary disease is not currently active.  Her     chest is clear.  10.      She does have a history of episodic dizziness, and it may be that     these episodes are related to relative hypoglycemia.  11.      Irritable bowel syndrome and diverticulosis we will defer to the GI     service for management.     ___________________________________________                                         Vania Rea, M.D.   LC/MEDQ  D:  08/09/2003  T:  08/09/2003  Job:  [id number]   cc:   Milus Mallick. Lodema Hong, M.D.  [address]  [place], [place] [address]  Fax: [phone]

## 2010-07-29 NOTE — Procedures (Signed)
NAME:  [name], [name]                  ACCOUNT NO.:  [id number]   MEDICAL RECORD NO.:  [id number]          PATIENT TYPE:  OUT   LOCATION:  SLEEP LAB                     FACILITY:  [facility]   PHYSICIAN:  Marcelyn Bruins, M.D. [facility] DATE OF BIRTH:  [birth year]   DATE OF STUDY:  05/22/2004                              NOCTURNAL POLYSOMNOGRAM   REFERRING PHYSICIAN:  Dr. Syliva Overman.   INDICATION FOR THE STUDY:  Hypersomnia with sleep apnea.   SLEEP ARCHITECTURE:  The patient had total sleep time of 446 minutes with a  sleep efficiency of 99%. There was decreased slow wave sleep and REM. Sleep  onset latency was normal and REM latency was very prolonged at 280 minutes.   IMPRESSION:  1.  Mild obstructive sleep apnea/hypopnea syndrome with a respiratory      disturbance index of 14 events per hour and oxygen desaturation as low      as 78%. Events were not positional. The patient did not meet split night      criteria until late in the study, therefore C-PAP was not initiated. If      the decision is made to treat the patient with C-PAP, I would recommend      a pressure of 8 cm initially, followed by formal titration at some point      in time if the patient wishes to continue with this type of treatment.  2.  Moderate to loud snoring noted throughout the study.  3.  No clinically significant cardiac arrhythmias.      KC/MEDQ  D:  06/07/2004 11:12:02  T:  06/07/2004 16:07:44  Job:  [id number]

## 2010-07-29 NOTE — Discharge Summary (Signed)
NAME:  [name], [name]                            ACCOUNT NO.:  [id number]   MEDICAL RECORD NO.:  [id number]                   PATIENT TYPE:  INP   LOCATION:  A323                                 FACILITY:  [facility]   PHYSICIAN:  Hanley Hays. Dechurch, M.D.           DATE OF BIRTH:  07/10/1950   DATE OF ADMISSION:  08/09/2003  DATE OF DISCHARGE:  08/13/2003                                 DISCHARGE SUMMARY   DIAGNOSES:  1. Dysequilibrium and dizziness, etiology unclear, improved.  Suggesting     medication versus other.  2. Cognitive impairment.  3. Gait disorder and falls.  4. __________.  5. Bipolar disorder.  6. Headaches.  7. Obstructive sleep apnea.  8. Diabetes mellitus with question of hypoglycemia.  9. Irritable bowel syndrome with negative workup for heme positive     gastrointestinal source of blood loss.  Celiac antibody pending.  10.      Anemia with microcytosis and negative gastrointestinal workup and     negative stools; stable in hospital; question beta-thalassemia versus     sickle cell trait; to be further explored by primary physician.  11.      Mild lymphocytosis with normal white count; of unclear     significance.  12.      Anxiety.  13.      Hyperlipidemia.  14.      Chronic obstructive pulmonary disease.   DISPOSITION:  The patient is discharged to home.   MEDICATIONS:  1. Nexium 40 daily.  2. Ativan 3 t.i.d.  3. Depakote 500 at bedtime.  4. Zoloft 100 daily.  5. Lisinopril 10 daily.  6. Lipitor 20 daily.  7. Calcium with D three times daily.  8. Actonel 35 mg q. Friday.  9. Neurontin 400 mg b.i.d.  10.      Coated aspirin 81 daily.  11.      Restoril 30 mg q.h.s. p.r.n. sleep.  12.      Nu-Iron 150 mg once daily.   The patient is instructed to stop Topamax, hold her Avandia until seen by  Dr. Lodema Hong, to check her sugars twice daily until seen by Dr. Lodema Hong,  follow-up with Dr. Lodema Hong as scheduled, follow-up with Dr. __________ in  one to two  weeks.   The patient is a pleasant 60 year old female with multiple medical problems  who was in her usual state of health and apparently had not been feeling  well over the last several days prior to admission, when she complained of  feeling lethargic and staggerified.  She actually asked her relatives to  bring her to the emergency room for the worsening problems with her balance  and general malaise.  The patient was on multiple medications at the time of  presentation, but there was a question of whether she had hypoglycemia as an  etiology and her Avandia was discontinued during the hospital stay.  Throughout the hospital  stay her glucoses were all under 130.  She had a  good appetite.  Her Topamax was discontinued and her Neurontin was  decreased, as it was felt that the patient had marked dyskinesia.  She was  seen in consultation by neurology, who agreed.  The patient has known  pontine atrophy, raising the question of chronic ischemic disease versus  other.  The patient also has a history of obstructive sleep apnea, but she  felt that utilizing oxygen alone, instead of her CPAP at night, was more  beneficial to her well being.  The patient complained of hematochezia,  though her stools remained heme negative.  She had stable hemoglobin.  She  has been worked up on several occasions for a GI etiology of her anemia,  which has always been negative.  No further workup was done at this time,  though a celiac antibody panel is pending as she tended to complain of  diarrhea, but this could not be confirmed.  The patient states that she was  feeling much, much better and anxious for discharge.  Her gait, though  somewhat unbalanced at times, was stable.  Physical therapy recommended  utilizing cane, which was encouraged.  She is being discharged to home with  the regimen as noted above.   At the time of discharge this is an alert, pleasant, talkative female in no  distress.  Blood  pressure is 119/79; pulse 88.  Lungs are clear and  diminished.  Abdomen is obese and soft.  Heart regular, no murmur.  Extremities are without clubbing, cyanosis or edema.  She has a fairly  persistent twitching, particularly of her upper extremities and eyelids,  which has not changed.  She states this is normal and she feels back to  baseline.     ___________________________________________                                         Hanley Hays. Josefine Class, M.D.   FED/MEDQ  D:  08/13/2003  T:  08/14/2003  Job:  [id number]   cc:   Lodema Hong, Dr.

## 2010-07-29 NOTE — Discharge Summary (Signed)
NAME:  [name], [name]                  ACCOUNT NO.:  [id number]   MEDICAL RECORD NO.:  [id number]          PATIENT TYPE:  INP   LOCATION:  A218                          FACILITY:  [facility]   PHYSICIAN:  Marcello Moores, MD   DATE OF BIRTH:  1950/11/01   DATE OF ADMISSION:  06/10/2006  DATE OF DISCHARGE:  04/01/2008LH                               DISCHARGE SUMMARY   PRIMARY CARE PHYSICIAN:  Milus Mallick. [name], M.D.   ADMISSION AND HOSPITAL COURSE:  [name] is a 60 year old female patient  with a history of multiple medical problems.  She was admitted to [facility] on March 30 after she presented with altered mental  status.  She was found at home unresponsive and she was brought to the  emergency room and was admitted for altered mental status of unknown  etiology.  The patient has had similar episodes and admissions to this  hospital and otherwise during the admission there was no other  associated symptoms or physical findings and a CAT scan of the brain was  also normal and the patient was admitted for observation and she was  observed and there was no history of seizure or altered mental status  since her admission and she stayed in the hospital for two days and  today she is ready to be discharged.   PHYSICAL EXAMINATION:  GENERAL:  Today the patient is stable and clear,  VITAL SIGNS: Normal. Blood pressure 134/75, temperature 97, pulse rate  58, respiratory 20 and saturation is 100%.  HEENT: She has pink conjunctivae, nonicteric.  Pupils are equal and  reactive.  NECK:  Supple.   CHEST:  Good air entry bilaterally.  CVS: S1-S2 regular, no abnormality.  ABDOMEN:  Soft.  EXTREMITIES:  No pedal edema.  Peripheral pulses positive.  CNS: She is alert and well oriented.   ADMISSION LABS:  Laterally normal except for the mild chronic anemia  with hemoglobin of 9.9.  Otherwise, normal. CAT scan of the brain also  was normal.   ASSESSMENT:  1. Discharge.  2. Altered  mental status, unknown etiology.  This was also discussed      with neurologist Dr. Gerilyn Pilgrim and evaluated by him and he ordered      EEG to be done as an outpatient. The patient was given appointment      for today to have EEG and have follow-up with him, with her primary      doctor and with her mental health physician.  3. Hypertension, fairly controlled.  4. Bipolar disorder, stable.  5. Diverticulitis, stable.  6. Glaucoma  stable.  7. Migraine headaches.  8. Sleep apnea, stable.  9. History of stroke, stable.  10.Chronic back, pain stable.   MEDICATIONS:  She was sent with her home medications unchanged.  1. __________  10 mg p.o. daily.  2. Zetia 10 mg p.o. daily.  3. Phenergan 25 mg p.r.n.  4. Gabapentin 300 mg three times a day.  5. Metoprolol 12.5 mg five day.  6. Lorazepam 1 mg four times a day.  7. Sertraline 200  mg once a day.  8. Prilosec 20 mg once a day.  9. Depakote 250 mg twice a day.  The patient stated that she is on      this dose.  10.Plavix 75 mg p.o. daily.  11.Clonidine 0.1 mg at bedtime.  12.Lisinopril 10 mg p.o. daily.  13.Singulair 10 mg daily.  14.Topamax 25 mg twice a day.  15.Bentyl p.o. 20 mg three times a day.   The patient was advised to have EEG and have follow-up with neurologist  and her PMD, Dr. Lodema [name] and mental health.  The patient agreed and  understood.      Marcello Moores, MD  Electronically Signed     MT/MEDQ  D:  06/12/2006  T:  06/13/2006  Job:  [id number]

## 2010-07-29 NOTE — Op Note (Signed)
NAME:  [name], [name]                  ACCOUNT NO.:  [id number]   MEDICAL RECORD NO.:  [id number]          PATIENT TYPE:  AMB   LOCATION:  DAY                           FACILITY:  [facility]   PHYSICIAN:  Lionel December, M.D.    DATE OF BIRTH:  1950-08-03   DATE OF PROCEDURE:  06/08/2005  DATE OF DISCHARGE:                                 OPERATIVE REPORT   PROCEDURE:  Colonoscopy.   INDICATIONS:  [name] is a 60 year old Afro-American female with multiple  medical problems who is on numerous medications who has had acute on chronic  diarrhea.  She has been empirically treated with Flagyl by Dr. Lodema Hong but  has not improved.  She has also taken loperamide and Imodium as well as  antispasmodics but without any relief of her symptoms.  Now, she is having  multiple accidents.  She is undergoing diagnostic colonoscopy.  Procedure  and risks were reviewed with the patient, and informed consent obtained.   MEDICATIONS FOR CONSCIOUS SEDATION:  Demerol 50 mg IV Versed 14 mg IV.   FINDINGS:  Procedure performed in endoscopy suite.  The patient's vital  signs and O2 saturation were monitored during the procedure and remained  stable.  The patient was placed in left lateral position and rectal  examination performed.  The rectal tone was somewhat diminished.  Olympus  videoscope was placed in rectum and advanced under vision into sigmoid colon  and beyond.  Preparation was excellent.  Scope was passed into cecum.  Blunt  cecum was normal.  Pictures taken for the record.  As the scope was  withdrawn, colonic mucosa was carefully examined.  There were scattered  diverticula involving the right colon and one at sigmoid colon.  Mucosa,  however, was normal.  Random biopsies taken from the sigmoid colon looking  for collagenous and/or microscopic colitis.  Rectal mucosa was normal.  Scope was retroflexed to examine anorectal junction which was unremarkable.  Endoscope was straightened and withdrawn.  The  patient tolerated the  procedure well.   FINAL DIAGNOSIS:  1.  No endoscopic evidence of colitis.  2.  Scattered diverticula at ascending colon and sigmoid colon.   RECOMMENDATIONS:  1.  She will discontinue loperamide. Will start on paregoric 2.5 mL 4 times      a day prescription given for 120 mL without any refill.  2.  She will she can stay on Bentyl at 20 mg t.i.d.  3.  We will check a CBC with differential, metabolic 7 and magnesium levels      today.  We will contact the patient with results of blood test and      biopsy and plan to see in the office in two weeks.      Lionel December, M.D.  Electronically Signed     NR/MEDQ  D:  06/08/2005  T:  06/09/2005  Job:  [id number]

## 2010-07-29 NOTE — Op Note (Signed)
NAME:  [name], [name]                  ACCOUNT NO.:  [id number]   MEDICAL RECORD NO.:  [id number]          PATIENT TYPE:  AMB   LOCATION:  DAY                           FACILITY:  [facility]   PHYSICIAN:  Vickki Hearing, M.D.DATE OF BIRTH:  May 21, 1950   DATE OF PROCEDURE:  07/22/2004  DATE OF DISCHARGE:                                 OPERATIVE REPORT   PREOPERATIVE DIAGNOSIS:  Right carpal tunnel syndrome.   POSTOPERATIVE DIAGNOSIS:  Right carpal tunnel syndrome.   PROCEDURE:  Open right carpal tunnel release.   SURGEON:  Dr. Romeo Apple.   ASSISTANT:  None.   ANESTHESIA:  Bier block.   FINDINGS:  Carpal tunnel syndrome with severe synovitis.   COMPLICATIONS:  None.   SPECIMENS:  None.   The patient went to recovery room in stable condition.   [name] had chronic carpal tunnel syndrome confirmed by nerve conduction  studies, presented for carpal tunnel release due to pain, paresthesias, and  refractory carpal tunnel syndrome despite nonoperative care.   After identifying the patient, Almarie Kurdziel, in the preoperative holding area,  her right wrist was marked as the surgical site, countersigned by the  surgeon, history and physical updated. Ancef was given. She was taken to the  operating room for a bier block. After a successful bier block, an incision  was made over the carpal tunnel, subcutaneous tissue divided. Blunt  dissection carried out until the distal aspect of the carpal tunnel was  identified. Blunt dissection was carried out beneath the carpal tunnel.  Transverse carpal ligament was released. Wound was irrigated. Nerve was  freed and wound was closed in interrupted fashion with 3-0 nylon suture  followed by injection with 10 cc of Sensorcaine plain. Sterile dressing was  applied. Tourniquet was released. The patient was taken to the recovery room  in stable condition.      SEH/MEDQ  D:  07/22/2004  T:  07/22/2004  Job:  [id number]

## 2010-07-29 NOTE — H&P (Signed)
NAME:  [name], [name]                  ACCOUNT NO.:  [id number]   MEDICAL RECORD NO.:  [id number]          PATIENT TYPE:  AMB   LOCATION:  DAY                           FACILITY:  [facility]   PHYSICIAN:  Vickki Hearing, M.D.DATE OF BIRTH:  1950/10/09   DATE OF ADMISSION:  DATE OF DISCHARGE:  LH                                HISTORY & PHYSICAL   CHIEF COMPLAINT:  Bilateral upper extremity numbness and tingling.   HISTORY:  A 60 year old female with bilateral carpal tunnel syndrome  confirmed by EMG and nerve conduction study.  Her symptoms have been going  on for several years now.  I am concerned that she may not get full recovery  based on the length of symptoms.  She has weakness in the upper extremities.  She cannot hold onto things.  She has difficulty with activities of daily  living, and has nocturnal pain.  She has a history of weight gain, shortness  of breath, diarrhea, nausea, headache, dizziness, migraine, numbness,  weakness, tremor, osteoporosis, depression, mood swings, bipolar disease,  sinusitis, sinus problems.  Other systems were normal.   ALLERGIES:  KEFLEX.   MEDICAL PROBLEMS:  1.  Mini strokes.  2.  Irritable bowel.  3.  Gastric ulcer.  4.  Depression.  5.  Hypertension.  6.  Bipolar disorder.  7.  Hysterectomy.  8.  Cholecystectomy.  9.  Ovarian cystectomy.  10. D&C.   MEDICATIONS:  1.  Zetia.  2.  Prilosec.  3.  Neurontin.  4.  Lorazepam.  5.  Depakote.  6.  Singulair.  7.  Bonivar.  8.  Advair.  9.  Lisinopril.  10. Lomotil.  11. Lipitor.  12. Plavix.  13. Restoril.  14. Zoloft.  15. Clonidine.   FAMILY HISTORY:  Heart disease, arthritis, cancer, asthma, diabetes.   SOCIAL HISTORY:  She is single.  Disabled.  Does not smoke or drink.  Caffeine use minimal to moderate amounts.   PHYSICAL EXAMINATION:  VITAL SIGNS:  Weight 169, pulse 76, respiratory rate  18.  GENERAL:  Development, grooming, hygiene, nutrition normal.  CARDIOVASCULAR:   Peripheral vascular system, no swelling, normal pulses.  LYMPH SYSTEM:  Negative.  EXTREMITIES:  Primarily, her upper extremities show bilateral decreased  sensation in the median nerve distribution despite normal range of motion  and stability.  She has grip strength weakness.  Her lower extremities show  no contractures, subluxation, atrophy, or tremor, and no major  malalignments.  SKIN:  No ulcerations.  NEUROLOGIC:  Gait abnormal.  Coordination is poor.  Reflexes normal.  Sensation decreased median nerve.  PSYCHOLOGICAL:  She is oriented x3.  Her mood is pleasant.   PLAN:  Her right carpal tunnel will be done first for right carpal tunnel  syndrome with open right carpal tunnel released.      SEH/MEDQ  D:  07/21/2004  T:  07/22/2004  Job:  [id number]   cc:   [facility]

## 2010-07-29 NOTE — Group Therapy Note (Signed)
NAME:  [name], [name]                  ACCOUNT NO.:  [id number]   MEDICAL RECORD NO.:  [id number]          PATIENT TYPE:  INP   LOCATION:  A322                          FACILITY:  [facility]   PHYSICIAN:  Margaretmary Dys, M.D.DATE OF BIRTH:  06/02/1950   DATE OF PROCEDURE:  05/06/2006  DATE OF DISCHARGE:                                 PROGRESS NOTE   SUBJECTIVE:  The patient feels slightly better, although continues to  complain of some nondescript abdominal pain.  She has had no fevers or  chills.   OBJECTIVE:  GENERAL:  Conscious, alert, comfortable, not in acute  distress.  VITAL SIGNS:  Stable.  LUNGS:  Lung exam was clear.  ABDOMEN:  Soft, nontender.  Bowel sounds positive.  No masses palpable.  EXTREMITIES:  No edema.   LABORATORY DATA:  Lipase was abnormal at 57.   ASSESSMENT/PLAN:  Acute on chronic abdominal pain, possibly irritable  bowel syndrome versus chronic pancreatitis.  The patient is doing much  better today.  Will continue to advance her diet.  She is being followed  by GI.  I anticipate the patient will likely be able to go home  tomorrow.  Overall she is stable and doing better.  The patient usually  engages in incoherent explanation of her symptoms that did not have any  significant bearing to her presentation at the hospital.      Margaretmary Dys, M.D.  Electronically Signed     AM/MEDQ  D:  05/06/2006  T:  05/06/2006  Job:  [id number]

## 2010-07-29 NOTE — Consult Note (Signed)
NAME:  [name], [name]                  ACCOUNT NO.:  [id number]   MEDICAL RECORD NO.:  [id number]          PATIENT TYPE:  INP   LOCATION:  A322                          FACILITY:  [facility]   PHYSICIAN:  R. Roetta Sessions, M.D. DATE OF BIRTH:  23-Sep-1950   DATE OF CONSULTATION:  05/03/2006  DATE OF DISCHARGE:                                 CONSULTATION   REASON FOR CONSULTATION:  Abdominal pain, nausea, abnormal LFTs.   HISTORY OF PRESENT ILLNESS:  The patient is a 60 year old, African-  American female, who was admitted with recurrent altered mental status  changes.  She was brought to the emergency department by her son after  she was noted to have several days of lethargy.  She has a long history  of chronic abdominal pain as well as chronic diarrhea and is felt to  have IBS.  She states for the last three weeks, her abdominal pain has  been more frequent.  Initially it was intermittent, but now it seems to  be more constant.  She describes it as diffuse but more so in the upper  abdomen.  She has chronic back pain and has been receiving injections  for this.  She questioned whether her abdominal pain was referred to her  back, initially it seemed to be related.  Then she noted, however, the  pain seemed to be worse when she eats.  She is also complaining of lack  of appetite, early satiety, and a 15-pound weight loss since November of  2007.  She states her heartburn is well controlled.  She has a long  history of chronic diarrhea and states that generally she has not had  any change in her stools.  Since she has been in the hospital, her  stools have been more formed.  Denies any melena or rectal bleeding.   Since admission, her LFTs have been elevated.  Her total bilirubin is  normal, however, at 0.5, her alkaline-phosphatase was normal at 106 and  is now up to 128, her AST was 115, now is 100, ALT 56, now is 62,  albumin is 2.3, her amylase is normal at 70, her lipase was 54 and is  now up to 69.  Notably back in November of 2007, her transaminases were  elevated with an AST of 55 and ALT of 39.  Her stool was heme-negative  x2 in November.  Her calcium level was normal at 8.4.  Her INR is normal  at 1.  Over the last couple of days, her mental status has returned to  baseline.  It is felt that it may be due to some of her medications.   HOME MEDICATIONS:  1. Ambien 10 mg q.h.s.  2. Ativan 1 mg q.i.d.  3. Auralgan 2 drops in the ear t.i.d.  4. Bentyl 20 mg t.i.d.  5. Clonidine 0.1 mg q.h.s.  6. Depakote 250 mg t.i.d.  7. Neurontin 300 mg t.i.d.  8. Lisinopril 10 mg daily.  9. Metoprolol 12.5 mg b.i.d.  10.Nystatin topical b.i.d.  11.Phenergan 25 mg b.i.d.  12.Plavix 75 mg daily.  13.Prilosec 20 mg daily.  14.Singulair 10 mg daily.  15.Skelaxin 400 to 800 mg q.h.s.  16.She just started Tamiflu for what she felt was flu-like symptoms,      only took one dose.  17.Topamax 25 mg b.i.d.  18.Tussionex 1 teaspoon q.h.s.  19.Zetia 10 mg Monday, Wednesday, Friday, and Sunday.  20.Zoloft 200 mg daily.   ALLERGIES:  1. KEFLEX.  2. PYRIDIUM.  3. PENICILLIN causes rash.  4. CASTOR OIL.   PAST MEDICAL HISTORY:  1. IBS with diarrhea.  2. Hypertension.  3. COPD.  4. Obstructive sleep apnea.  5. Migraine headaches.  6. GERD.  7. Hiatal hernia.  8. History of iron deficiency anemia requiring iron infusions in the      past, has been stable.  9. TIAs.  10.Anxiety.  11.Bipolar disorder.  12.Chronic back pain.  13.A history of nerve compression due to disk protrusion on the left      side at L3-L4 and L4-L5.  She has mild spinal stenosis at L4-L5.  14.She also had a colonoscopy in March of 2007 by Dr. __________ for      diarrhea, and there was no evidence of colitis.  She had scattered      diverticula at the ascending and sigmoid colon.   PAST SURGICAL HISTORY:  1. Cholecystectomy.  2. Bilateral breast reduction.  3. Hysterectomy with later  oophorectomy.   SOCIAL HISTORY:  She is divorced.  She lives with her son.  She is  disabled.  She used to work as a Lawyer in [place].  She quit smoking about five  years ago.  No alcohol use.   FAMILY HISTORY:  Mother died with an MI in her 32s.  Father died in his  38s with diabetes mellitus complications.  A sister died in her 77s with  pancreatic cancer.  A paternal aunt died in her 52s of colon cancer.   REVIEW OF SYSTEMS:  See HPI for GI and CONSTITUTIONAL.  CARDIOPULMONARY:  Denies any chest pain or shortness of breath.   PHYSICAL EXAMINATION:  VITAL SIGNS:  Temp 98, pulse 70, respirations 16,  blood pressure 131/68.  Weight in November was 161; weight this  hospitalization 145.  Height 61 inches.  GENERAL:  A pleasant, somewhat slow, African-American female in no acute  distress.  SKIN:  Warm and dry, no jaundice.  HEENT:  Sclerae are nonicteric.  Oropharyngeal mucosa moist and pink.  She is edentulous.  NECK:  No lymphadenopathy.  CHEST:  Lungs clear to auscultation.  CARDIAC:  Regular rate and rhythm.  No murmurs, rubs, or gallops.  ABDOMEN:  Positive bowel sounds.  Abdomen is soft.  She has diffuse,  mild tenderness throughout her abdomen to deep palpation.  No rebound  tenderness or guarding, no abdominal bruits, no hernias.  EXTREMITIES:  No edema.   LABORATORY DATA:  As mentioned in the HPI.  In addition, her hemoglobin  is 10.1 comparable to November of 2007, MCV 72.9, platelets 231,000,  white count 6100, sodium 136, potassium 4, BUN initially was 76 down to  22, creatinine was initially 1.9 down to 0.64, glucose 103.  Her TSH is  0.755.  Abdominal ultrasound yesterday revealed an absent gallbladder,  left common bile duct diameter of 4.5 mm.  She had a CT in November of  2007, which revealed degenerative changes of the lumbar spine.  Today's  CT final is pending; however, Dr. Jena Gauss spoke with Dr. Azucena Kuba, and there is evidence of mild pancreatitis on  today's CT.  She had  an MRI of the  spine, July of 2007, with evidence of mild spinal stenosis at L4-L5,  left-sided disk protrusion with nerve compression on L3-L4, L4-L5.   IMPRESSION:  The patient is a 60 year old lady with multiple medical  problems, who has chronic abdominal pain and diarrhea, who is felt to  have a history of irritable bowel syndrome.  For the past couple of  weeks, she has had acute on chronic abdominal pain.  She has also had  some anorexia, early satiety, and complaints of a 15-pound weight loss  in the last three months.  She has been noted to have elevated liver  function tests, although she had this in November as well.  It's  etiology is unclear at this time.  She did have a mildly elevated  lipase, and now CT today suggests mild pancreatitis, which could explain  her acute abdominal pain and nausea.  With regards to her chronic  abdominal pain, this may be due to her irritable bowel syndrome or even  radiculopathy from her back.  Regarding elevated liver function tests,  this is predominantly transaminases and may be related to medication or  even fatty liver.  Will need to be further investigated.  Etiology of  her mild pancreatitis is  not clear at this time; however, there is a  black box warning with regards to Depakote and the risk of pancreatitis  even after being on it for several years.  No evidence of biliary source  at this time.  Her calcium level was normal.  Her triglyceride level was  unknown.   She has chronic iron deficiency anemia, which appears to be stable.  Her  colonoscopy was in March of 2007 as outlined above.   RECOMMENDATIONS:  1. Will change her diet back to a clear liquid diet in light of recent      CT findings.  2. Would consider changing Depakote to another agent secondary to the      risk of pancreatitis.  3. A fasting lipid profile in the morning.  4. We may consider EGD at some point if her symptoms do not settle      down especially in  light of her chronic, ongoing, abdominal pain,      anorexia, and weight loss.  5. We will go ahead and check viral hepatitis markers.      Tana Coast, P.AJonathon Bellows, M.D.  Electronically Signed    LL/MEDQ  D:  05/03/2006  T:  05/03/2006  Job:  [id number]   cc:   Milus Mallick. Lodema Hong, M.D.  Fax: [phone]   R. Roetta Sessions, M.D.  [address]  [place]  [place] [address]

## 2010-07-29 NOTE — Discharge Summary (Signed)
NAME:  [name], [name]                  ACCOUNT NO.:  [id number]   MEDICAL RECORD NO.:  [id number]          PATIENT TYPE:  INP   LOCATION:  A204                          FACILITY:  [facility]   PHYSICIAN:  Vania Rea, M.D. DATE OF BIRTH:  Jul 25, 1950   DATE OF ADMISSION:  12/19/2003  DATE OF DISCHARGE:  10/10/2005LH                                 DISCHARGE SUMMARY   PRIMARY CARE PHYSICIAN:  Milus Mallick. Lodema Hong, M.D.   NEUROLOGIST:  Kofi A. Gerilyn Pilgrim, M.D.   DISCHARGE DIAGNOSES:  1.  Acute confusional episode, resolved.  2.  Recurrent episode of confusion.  3.  History of affective disorder.  4.  Diabetes, type 2, controlled on diet.  5.  History of hypertension.  6.  History of obstructive sleep apnea.  7.  History of migraines.  8.  History of hyperlipidemia.  9.  Episodic dyspnea.  10. Irritable bowel syndrome.  11. History of diverticular disease.   DISPOSITION:  Discharged to home.   DISCHARGE CONDITION:  Stable.   DISCHARGE MEDICATIONS:  The patient is to resume her outpatient medications.   HOSPITAL COURSE:  Please refer to admission history and physical of October  8. This is a middle-aged, African-American lady who was admitted with a  history of difficulty making herself understood, saying things that were  incomprehensible to the people around her. She reports she was talking with  God but the people around her could not understand. She was admitted for  evaluation, and it was noted at that time that she is being investigated by  the neurologist for episodic confusional states, and was scheduled to have  an outpatient MRA today. The patient soon came back to her baseline the day  after admission but could not get a clear explanation of what had happened  to her. It was felt that the best thing was to keep the patient in the  hospital until she completed the MRA which had been ordered. The patient had  a MRA early this morning. It revealed no acute defects. The patient  is being  discharged to continue on her outpatient medications with follow up with Dr.  Gerilyn Pilgrim as soon as possible.   This morning, patient says she feels comfortable.   OBJECTIVE:  VITAL SIGNS:  Temperature 97.9, pulse 76, respirations 20, blood  pressure 110/86.  CHEST:  Clear to auscultation bilaterally.  CARDIOVASCULAR:  She has a regular rhythm.  ABDOMEN:  Her abdomen is soft and nontender.  EXTREMITIES:  She has no lower extremity edema.   LABS: Hct, 30.0;  MCV 70 (chronic)   FOLLOW UP:  Follow up with her primary care physician, Dr. Syliva Overman,  in 1 to 2 weeks. Follow up with Dr. Gerilyn Pilgrim within 1 week.   SPECIAL INSTRUCTIONS:  The patient is to have home health care follow up to  assist with her medications.     Leop   LC/MEDQ  D:  12/21/2003  T:  12/21/2003  Job:  [id number]

## 2010-07-29 NOTE — Consult Note (Signed)
NAME:  [name], [name]                  ACCOUNT NO.:  [id number]   MEDICAL RECORD NO.:  [id number]          PATIENT TYPE:  INP   LOCATION:  A218                          FACILITY:  [facility]   PHYSICIAN:  Kofi A. Gerilyn Pilgrim, M.D. DATE OF BIRTH:  December 31, 1950   DATE OF CONSULTATION:  DATE OF DISCHARGE:  06/12/2006                                 CONSULTATION   NEUROLOGY CONSULTATION:  This is a 60 year old black female who was admitted to the hospital with  the patient being found unresponsive.  It appeared that she had some  type of unwitnessed event and has been amnestic to the event.  The  patient apparently had some episodes, per the chart, while she was on  Depakote and was thought that due to Depakote toxicity, this medication  was supposed to be weaned.  She is still on this medication, however,  for unknown reasons.  The patient, again, does not remember what has  happened to her.  She does report having recurring falls, which has been  an issue for the patient in the past despite the patient having some  evaluation and adjustment in her multiple medication list.   PAST MEDICAL HISTORY:  1. Diabetes.  2. Hypertension.  3. Bipolar disorder.  4. Diverticular disease.  5. Glaucoma.  6. Sleep apnea.  7. Gait problems/multiple falls.  8. Migraine.  9. Chronic low back pain.  10.History of stroke.   ADMISSION MEDICATIONS:  1. Ambien.  2. Zetia.  3. Phenergan.  4. Neurontin 300 mg t.i.d.  5. Metoprolol 12.5 mg b.i.d.  6. Lorazepam 1 mg 4 times a day.  7. Sertraline 200 mg a day.  8. Depakote, supposed to be on a weaning protocol.  9. Plavix 75 mg.  10.Clonidine 0.1 mg q.h.s.  11.Lisinopril 2 mg.  12.Singulair.  13.Topamax 25 mg b.i.d.  14.Bentyl 20 mg t.i.d.  15.Prilosec.   FAMILY HISTORY:  Diabetes, coronary artery disease, hypertension.   SURGICAL HISTORY:  Status post cholecystectomy, carpal tunnel release  surgery, hysterectomy and breast reduction surgery.   SOCIAL  HISTORY:  Former smoker, no alcohol or illicit drug use.   ALLERGIES:  PENICILLIN, CEFTRIAXONE, and PYRIDIUM.   PHYSICAL EXAMINATION:  VITAL SIGNS:  Temperature 97.6, pulse 56,  respirations 20, blood pressure 150/67.  HEENT:  Neck is supple.  Head is normocephalic, atraumatic.  ABDOMEN:  Soft.  EXTREMITIES:  No significant edema.  NEUROLOGIC:  Mentation:  The patient is awake and alert.  Speech is  slightly dysarthric but this, I believe, is her baseline.  She does  recognize me as she has seen [name] in the office previously for gait  problems.  She converses well and is lucid and coherent.  Cranial nerve  evaluation:  Pupils are equal, round and reactive to light and  accommodation.  Extraocular muscles are intact.  Visual fields are  intact.  Facial muscles are symmetric.  Tongue is midline.  Uvula is  midline.  Shoulder shrug is normal.  Motor examination shows mild  proximal weakness involving upper and lower extremities.  Distal  strength is normal.  No impairment of tone or bulk.  Reflexes are  normal.  Coordination indicates no tremors.  There is no rigidity,  bradykinesia or dysmetria noted.  Sensation is equal to light touch  bilaterally.  Gait is somewhat wide-based but actually seems relatively  steady today.   CT scan of brain shows nothing acute.   LABORATORY EVALUATION:  WBC 5.7, hemoglobin 10, platelet count of 217.  Sodium 141, potassium 3.2, chloride 111, CO2 27, glucose 125, BUN 12,  creatinine 0.7.  Liver enzymes are normal.  Calcium 8.6.  CPK 41, 61.  Alcohol level:  None detected.  Urine drug screen positive for  metabolites of opioids and benzodiazepines.  Depakote level/valproic  acid level:  Undetected.  TSH 0.7.   ASSESSMENT:  1. Unwitnessed event characterized by amnesia, unresponsiveness and      confusion, with spontaneous recovery.  The semiology seems typical      for an unwitnessed seizure that is unprovoked at this time.  2. Recurrent falls/gait  disorder, likely multifactorial, including a      history of thoracic and low back pain and also including a history      of polypharmacy.   RECOMMENDATIONS:  1. EEG.  2. X-ray of the thoracic and lumbar spine.  3. Seizure precautions until EEG can be obtained.      Kofi A. Gerilyn Pilgrim, M.D.  Electronically Signed     KAD/MEDQ  D:  06/14/2006  T:  06/14/2006  Job:  [id number]

## 2010-07-29 NOTE — Consult Note (Signed)
NAME:  [name], [name]                            ACCOUNT NO.:  [id number]   MEDICAL RECORD NO.:  [id number]                   PATIENT TYPE:  INP   LOCATION:  A323                                 FACILITY:  [facility]   PHYSICIAN:  Kofi A. Gerilyn Pilgrim, M.D.              DATE OF BIRTH:  03-05-1951   DATE OF CONSULTATION:  DATE OF DISCHARGE:                                   CONSULTATION   REASON FOR CONSULTATION:  1. Dizziness.  2. Staggering.  3. Possible cerebrovascular accident.   IMPRESSION:  1. Recurrent spells of dysequilibrium, dizziness and frank blackout spells.     The etiology has been quite evasive.  No single clear neurological     etiology.  She does have extensive ischemic pontine lesions which likely     contributes, but I believe medication effect is playing a huge role.     Additionally, anemia may also be contributing.  2. Cognitive impairment again, I believe medication effect is the most     likely culprit.  Untreated obstructive sleep apnea syndrome can also     contribute.  3. Gait disorder again, multifactorial, including ischemic pontine lesions.     Medication effect which I believe again is a major problem.  4. Chronic daily headaches, tension type in nature.  5. Dyskinesia involving the hands and face.  This may be the beginning of     tardive dyskinesia, especially given the longstanding history of     psychiatric illness.  6. Untreated obstructive sleep apnea syndrome, or rather inability to use     CPAP due to chronic sinusitis.   RECOMMENDATION:  I think we will make some adjustments in her medication.  I  will suggest that the Topamax be discontinued.  This is notorious for  causing cognitive impairment.  The Neurontin will also be reduced, as this  is known to cause dizziness, and gait disorder.  Benzodiazepine should also  be reduced.  For now, we will reduce Ativan to t.i.d. dosing.  Ultimately,  we may back this down to only p.r.n. instead of taking it  around the clock.  She is also taking Restoril.  My preference would be to discontinue this,  and try a non- benzodiazepine hypnotic such as Ambien or Lunesta.  Regarding  her sinus problems, we could try Flonase nasal spray to be used on a  continuous basis, if she can tolerate this, and the addition of a non-  sedating antihistamine could also be helpful such as Allegra.   HISTORY:  This is a 60 year old African American female who has multiple  medical problems.  She apparently developed the acute onset of severe  posterior headache associated with a drunk-like sensation.  She went to  church, but the problem persisted which resulted in the patient coming to  the emergency room.  The patient is rather evasive and tangential in trying  to get  a history.  She had to be redirected multiple occasions.  Her face  looks familiar, and I did indeed see her in the past.  I was able to pull up  her chart on the Internet.  She indeed has had recurrent spells of dizziness  which apparently seem to be triggered from headaches which is exactly the  way she presented today.  In fact, sometimes the dizzy spells result in the  patient becoming acutely ataxic to the point where she falls to the ground  and sometimes passes out.  The patient reports a week ago having some  commands that were concerning to her friends.  Our notes from the office  indicates that she has had problems like this in the past.  We saw her a  year ago.  She apparently was making off the wall comments at times.  The  patient was worked up extensively in the outpatient setting.  She had an MRI  of the brain which showed extensive pontine lesions bilaterally, the type  that is seen with central pontine myelinolysis, although I do not believe  she had a history of sodium abnormalities as far as I know.  She also had an  MRI which did not show any significant intracranial stenosis.  EMG of the  legs was done because of the gait  instability.  This was essentially  unrevealing.  She had two EEG's again because of the syncopal spells.  Both  of these were normal.  One was done sleep deprived, obtaining recordings  during sleep and did not show any epileptiform activity.  She did have EMG  of the upper extremities showing bilateral carpal tunnel syndrome and also  __________ tunnel syndrome on the right.  She did receive injections for the  carpal tunnel and did well with these.   PAST MEDICAL HISTORY:  1. A longstanding history of psychiatric disorder with clear bipolar     disorder.  2. Hypertension since 1992.  3. Diabetes since 1995.  4. Chronic obstructive pulmonary disease.  5. Obstructive sleep apnea syndrome.  She is noncompliant with her CPAP     machine because of this aggravating the sinus symptoms.  6. History of migraines.  7. Hyperlipidemia.  8. Chronic and episodic dizziness.  9. Diverticular disease.  10.      Irritable bowel syndrome.   ADMISSION MEDICATION:  1. Avandia 4 mg once a day.  2. Nexium 4 mg daily.  3. Aspirin 81 mg daily.  4. Topamax 75 mg in the morning and 50 mg in the evening.  5. Ativan 1 mg every four hours.  6. Restoril 30 mg at bedtime.  7. Neurontin 400 mg four times a day.  8. Depakote 500 mg at bedtime.  9. Phenergan 25 mg every 8 hours p.r.n.  10.      Zoloft 200 mg daily.  11.      Levbid 1 mg b.i.d.  12.      Lisinopril 10 mg daily.  13.      Lipitor 20 mg at nighttime.  14.      Antivert 25 mg p.r.n.  15.      Darvocet N 100 p.r.n. headaches.  16.      Lomotil p.r.n.  17.      Calcium D 600 mg three times a day.  18.      Actonel 35 mg weekly.  19.      Albuterol and Atrovent inhalers p.r.n.   ALLERGIES:  KEFLEX AND PYRIDIUM.   SOCIAL HISTORY:  She lives at home.  She has never been married.  She has  one son.  She is a retired Neurosurgeon.  She has been disabled for the past  20 years because of bipolar disorder.  The patient has used tobacco heavily in  the past, but quit four years ago.  No alcohol or illicit drug use.   FAMILY HISTORY:  Significant for myocardial infarction, hypertension,  diabetes.   REVIEW OF SYSTEMS:  As in the history of present illness.  The patient does  not report any diplopia, dysarthria or focal numbness or weakness currently.  Our notes from the office indicate that she has had some facial numbness  reported by her family.   PHYSICAL EXAMINATION:  GENERAL:  An obese lady, in no acute distress.  She  is noted to have an odd nervous movement involving the hands.  This is sort  of reminiscent of her evaluation in the office which actually showed that  she had frank pseudoathetoid movements at that time.  She also appears to  have some facial movements, but no frank or facial dyskinesias.  NECK:  Supple.  LUNGS:  Clear to auscultation bilaterally.  CARDIOVASCULAR:  Exam reveals a normal S1 and S2.  ABDOMEN:  Obese and soft.  EXTREMITIES:  No edema.  No varicosities.  NEUROLOGIC:  She is awake, alert.  She converses fluently and coherently.  However, she is quite tangential and sometimes evasive with her answers.  She redirects to the multiple occasions.  Cranial nerves evaluation shows  her pupils are 5 mm and sluggishly reactive.  Visual fields are intact.  Extraocular movements are full.  Facial muscle strength is normal.  Tongue  is midline, uvula is midline.  Shoulder shrug is normal.  Muscular  examination shows mild weakness in the legs, approximately 4 to 5, and  otherwise, upper extremities are normal strength.  The distal lower  extremities are normal.  This exam is actually the same as for in the  outpatient setting.  Reflexes are present.  Toes are both downgoing.  Sensory examination is normal to light touch and temperature bilaterally.  Coordination is unremarkable.  There is no dysmetria noted.  No Parkinsonism  is noted.  Gait is actually unremarkable at this time.   Repeat MRI of the brain  is essentially unchanged from the initial one done  several months ago.  Again, she has extensive pontine ischemic changes.  MRI  of the brain shows a fetal right PCA, but otherwise unremarkable.   LABORATORY DATA:  Sodium 139, potassium 3.9, chloride 112, cO2 29, glucose  90, BUN 31, creatinine 0.7, calcium 8.6.  D-Dimer is normal at 0.22.  INR  1.0.  Ferritin 170.  Vitamin B12 level 626.  Folate 8.3.  TSH 1.0.  Urinalysis negative, Depakote/ valproic acid level 73.  RPR nonreactive.   Thanks for this consultation.      ___________________________________________                                            Perlie Gold Gerilyn Pilgrim, M.D.   KAD/MEDQ  D:  08/11/2003  T:  08/11/2003  Job:  [id number]

## 2010-07-29 NOTE — Consult Note (Signed)
NAME:  [name], [name] DEVLIN                            ACCOUNT NO.:  [id number]   MEDICAL RECORD NO.:  [id number]                   PATIENT TYPE:  INP   LOCATION:  A323                                 FACILITY:  [facility]   PHYSICIAN:  Lionel December, M.D.                 DATE OF BIRTH:  May 06, 1950   DATE OF CONSULTATION:  08/11/2003  DATE OF DISCHARGE:                                   CONSULTATION   GASTROENTEROLOGY CONSULTATION:   HISTORY OF PRESENT ILLNESS:  The patient is a 60 year old African-American  female admitted today with weakness and  staggering.  The patient was  admitted through the emergency room where a CT scan of the head showed a  lesion suspicious for an acute lacunar infarct in the inferior right  lentiform nucleus.  The patient has a history of diabetes mellitus with  chronic diarrhea and also a diagnosis of irritable bowel syndrome and  diverticular disease by colonoscopy.  The patient reports that she has had a  long history of problems with her stomach and bowel and had a complete  workup including colonoscopy, EGD, and given capsule study done at the  [facility] by Dr. Lina Sar.  The patient states that it has been at  least a year but not 2 years since this workup was done.  Other than the  irritable bowel syndrome and diverticula disease the patient was unable to  state any other findings.  The patient states she has recently had fever,  chills, fatigue, she reports that her appetite has been good and she has had  no changes in weight.  The patient denies vomiting, reflux, hematemesis, and  reports minor swallowing difficulties.  The patient reports that she has  diarrhea at least four to five times a day alternating with occasional  constipation.  Patient reports occasional hematochezia, she denies melena.  Patient denies any abdominal pain.  The patient's last EGD/colonoscopy and  the given capsule study are as stated above.  The patient denies chest pain,  shortness of breath.  The patient denies urinary tract symptoms.   CURRENT MEDICATIONS:  1. Avandia 4 mg once daily.  2. Nexium 40 mg once daily for hiatal hernia.  3. Aspirin 81 mg once daily.  4. Topamax 25 mg in the morning, 50 mg in the evening.  5. Ativan 1 mg four times daily.  6. Restoril 30 mg at bedtime.  7. Neurontin 400 mg q.i.d.  8. Depakote 500 mg at bedtime.  9. Phenergan 25 mg q.8h. p.r.n. when necessary.  10.      Zoloft 200 mg once daily.  11.      Levbid one tablet daily.  12.      Lisinopril 10 mg once daily.  13.      Lipitor 20 mg at bedtime.  14.      Antivert 25 mg when necessary.  15.  Darvocet-N 100 mg daily when necessary.  16.      Lomotil when necessary for diarrhea.  17.      Calcium with D 600 mg three times a day.  18.      Actonel 35 mg each Friday.  19.      Albuterol and Atrovent inhalers when necessary for wheezing.   PAST MEDICAL HISTORY:  1. Hypertension.  2. Diabetes type 2.  3. Bipolar disorder.  4. COPD.  5. Obstructive sleep apnea.  6. Migraine.  7. Hyperlipidemia.  8. Episodic dizziness.  9. Irritable bowel syndrome.  10.      Diverticula disease by colonoscopy.  11.      Hiatal hernia.   ALLERGIES:  KEFLEX and PYRIDIUM.   FAMILY HISTORY:  The patient's mother died at the age of 58 of an MI, she  also had a history of hypertension and diabetes mellitus.  The father died  at the age of 63 of an MI and also had diabetes mellitus.  Siblings:  The  patient has one brother with hypertension and diabetes mellitus who is  living; the patient has one sister who died of pancreatic cancer.  There is  a family history of diabetes; no colon cancer.   SOCIAL HISTORY:  The patient is divorced.  She lives with her son.  He is  her only child.  She is on disability.  The patient denies use of tobacco,  alcohol, or street drugs.   PHYSICAL EXAMINATION:  VITAL SIGNS:  Temperature 98.2, pulse 75,  respirations 18, blood pressure 131/79,  height 62 inches, weight 174.77.  HEENT:  Normocephalic, atraumatic.  Sclerae are clear and nonicteric.  Conjunctivae are pink.  The patient neurologically appears somewhat slow.  CARDIOVASCULAR:  Regular rate and rhythm without murmurs, rubs, or gallops.  RESPIRATIONS:  Clear to auscultation bilaterally without wheezes, rales, or  rhonchi.  EXTREMITIES:  No edema, clubbing, or cyanosis.  SKIN:  Warm, dry, and without jaundice.  ABDOMEN:  There are bowel sounds present in all four quadrants.  The abdomen  is obese, distended, there is no splenomegaly, there are no masses palpated,  the liver is palpable at least 2 cm below the right costal margin, there is  right upper quadrant tenderness to palpation and right lower quadrant  tenderness to palpation.  RECTAL:  Large amount of firm stool present in the vault, the stool is brown  and Hemoccult negative.   LABORATORIES:  WBC 6.3, hemoglobin 10, hematocrit 30, MCV 71.1, platelets  218.  Prothrombin time 13, INR 1.  Sodium 143, potassium is 4.5, chloride  113, CO2 23, glucose 98, BUN 15, creatinine 0.7, calcium 9.  Lipid studies:  Cholesterol 170, triglycerides 198, HDL cholesterol 32, LDL cholesterol 98,  VDL cholesterol 40.  Thyroid studies:  TSH is 1.047.  Iron studies:  Serum  iron is low at 37, total iron binding capacity is 370, percent saturation is  low at 10, vitamin B12 is 626, folate 8.3, ferritin 170.  Depakote level 73.  Urinalysis clear.  RPR nonreactive.  Fecal occult blood negative.   IMPRESSION:  1. Iron-deficiency anemia.  2. Mild dysphagia.  3. Diagnosis of irritable bowel syndrome.  4. History of hiatal hernia.  5. Bright red blood per rectum.  6. History of osteoporosis.   The patient is 60 years old and has had by her history a complete  gastrointestinal workup within the past year by Dr. Lina Sar at the [facility].  Before  ordering repeat studies an attempt will be made to  secure these records.  The  patient has obviously been treated for GERD and  the irritable bowel syndrome symptoms.   RECOMMENDATIONS:  Secure patient's records detailing GI workup from the  [facility] prior to reordering these studies.  Patient reports difficulty  using the bedpan and should be allowed to use a bedside commode with  assistance only if this helps her bowels move.  Nursing has been made aware.  Further recommendations will follow when GI records have been obtained and  reviewed.  Thank you for the opportunity to participate in this patient's  care.     ________________________________________  ___________________________________________  Ashok Pall, PA                           Lionel December, M.D.   GC/MEDQ  D:  08/11/2003  T:  08/11/2003  Job:  [id number]

## 2010-07-29 NOTE — Consult Note (Signed)
NAME:  [name], [name]                  ACCOUNT NO.:  [id number]   MEDICAL RECORD NO.:  [id number]          PATIENT TYPE:  INP   LOCATION:  A320                          FACILITY:  [facility]   PHYSICIAN:  Kofi A. Gerilyn Pilgrim, M.D. DATE OF BIRTH:  1951-03-04   DATE OF CONSULTATION:  10/04/2005  DATE OF DISCHARGE:                                   CONSULTATION   The patient is a 60 year old black female who presents with dizziness and  gait instability.  The patient has had these events in the past and, in  fact, was admitted to this hospital approximately two years ago with similar  complaints.  The impression at that time was likely multifactorial with  medication effect playing a primary roll postop.  The patient's MRI at this  time showed significant pontine demyelination bilaterally.  Repeat imaging  done today shows similar findings with the scan unchanged since the initial  scan done in 2004.  The patient, again, reports having gait instability with  dysequilibrium and dizziness.  No clear focal deficit.  No alternation in  consciousness at this evaluation.  She has had previous altered mental  status but none reported during this visit.  The patient reports her legs  giving out.   PAST MEDICAL HISTORY:  COPD, hypertension, obstructive sleep apnea syndrome,  irritable bowel syndrome, chronic esophageal reflux disease, migraines,  hiatal hernia, diabetes mellitus, iron deficiency anemia, diverticular  disease, anxiety disorder, bipolar disorder, and TIAs.   ADMISSION MEDICATIONS:  Clonidine 0.1 mg q.h.s., Depakote 250 mg t.i.d.,  Lisinopril 10 mg q.h.s., Lorazepam 1 mg q.i.d., Neurontin 400 mg q.i.d.,  Singular 10 mg daily, Topamax 2025 mg at bedtime, Zoloft 200 mg daily,  Ambien 10 mg at bedtime, Zetia 10 mg once daily, Plavix 75 mg daily.   ALLERGIES:  PENICILLIN, KEFLEX, PYRIDIUM.   PAST SURGICAL HISTORY:  Status post cholecystectomy, hysterectomy with  oophorectomy, bilateral breast  reduction.   SOCIAL HISTORY:  She is divorced and lives with her son.  She is disabled.  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  She has smoked  on and off for the past 25 years, although she apparently quit five years  ago.  No alcohol use.   PHYSICAL EXAMINATION:  GENERAL:  Obese lady in no acute distress.  VITAL SIGNS:  Temperature 98.2, pulse 73, respirations 20, blood pressure  138/75.  HEENT:  The neck is supple.  Head is normocephalic and atraumatic.  LUNGS:  Clear to auscultation bilaterally.  ABDOMEN:  Soft.  EXTREMITIES:  No significant varicosities or edema.  MENTATION:  The patient is awake and alert.  She converses well.  She has  good spontaneous speech.  No dysarthria or dysphagia is noted.  She follows  commands bilaterally.  She is lucid.  CRANIAL NERVE EVALUATION:  Right pupil is slightly larger than the left,  appears to be cataract.  Pupils are reactive.  Visual fields are intact.  Extraocular movements are full.  Facial muscles are symmetric.  Tongue is  midline.  Uvula is midline.  Shoulder shrug  is normal.  MOTOR EXAMINATION:  Shows good strength bilaterally in both upper and lower  extremities.  There is normal bulk and tone.  Coordination shows dysmetria  of the left upper extremity and left lower extremity.  Reflexes are  preserved in both upper and lower extremities.  Plantar reflex is downgoing.  SENSATION:  Symmetric to light touch and temperature.  GAIT:  Somewhat slightly unsteady and antalgic but, otherwise, unrevealing.   MRI of the brain repeat shows, again, chronic demyelination extensive  involving the pontine region.  There is sparse demyelinating process  involving the supratentorial periventricular area.  The findings are  unchanged from the previous scan.   ASSESSMENT:  Multifactorial gait impairment.  Again, the picture seems  relatively similar to a previous evaluation conducted two years ago.  She  seemed to have  slight focality with dysmetria noted on the left side.  This  probably is a subacute or chronic finding as her MRI scan is negative for  any acute findings.  Again, medication effect is undoubtedly contributing to  her symptomatology.  There is no evidence of Parkinsonism or acute  neuropathy.   RECOMMENDATIONS:  I think we should reduce the Neurontin to 300 mg t.i.d.  This can subsequently be weaned off.  We may consider further adjustments  with the other medications.  Physical therapy has been ordered and I think  this is appropriate.   Thank you for this consultation.      Kofi A. Gerilyn Pilgrim, M.D.  Electronically Signed     KAD/MEDQ  D:  10/04/2005  T:  10/04/2005  Job:  [id number]

## 2010-07-29 NOTE — H&P (Signed)
NAME:  [name], [name]                  ACCOUNT NO.:  [id number]   MEDICAL RECORD NO.:  [id number]          PATIENT TYPE:  INP   LOCATION:  A204                          FACILITY:  [facility]   PHYSICIAN:  Margaretmary Dys, M.D.DATE OF BIRTH:  February 14, 1951   DATE OF ADMISSION:  12/19/2003  DATE OF DISCHARGE:                                HISTORY & PHYSICAL   PRIMARY CARE PHYSICIAN:  Milus Mallick. Lodema Hong, M.D.   ADMISSION DIAGNOSES:  1.  Acute confusional state.  2.  Generalized anxiety disorder.  3.  Extrapyramidal side effects with akathisia from narcotics.  4.  History of questionable cerebrovascular accident.  5.  Unsteady gait.   CHIEF COMPLAINT:  Confusion overnight and history of unsteadiness on her  feet.   HISTORY OF PRESENT ILLNESS:  [name] is an African-American female  with past medical history significant for diabetes, hypertension,  hyperlipidemia, bipolar disorder who is on multiple medications. Her son  reported that since yesterday, she has been incomprehensible that she was  saying words and sentences that did not make any sense. The patient denies  doing this. She says she was just talking to God and praying. She says she  was aware of all her thoughts and thinking processes and all the words she  was saying. In any case, this has happened in the past and multiple  evaluations have been done.  No suspicion of infarct had been made when she  was admitted.  The patient denies any visual or auditory hallucinations.  She denies any fever, chills, or rigors.  She has had no headache.  She says  she has occasional dizziness and lightheadedness which seems to have  improved from her last admission.  Family members also note a change in her  gait.  The patient uses a cane for ambulation.  The patient is quite aware  that she has an urge to keep moving her legs and her arms.  She attributes  this to her medications.  The patient was quite lucid during this interview.  She  denies any frequency, urgency, or dysuria.  She has no chest pain, no  shortness of breath.  She has had no cough.   She denies any syncopal attacks.  Her son was not present during the  interview, but her niece and brother were present and just thought that she  was doing much better based on the report they got from yesterday.   In the emergency room, there was concern if the patient had a stroke and CT  scan of her head was obtained which was negative for any acute intracranial  findings.   The patient is currently being evaluated by the neurologist, Dr. Gerilyn Pilgrim,  for reasons accounting for her unsteady gait and mental status change.  The  patient has had a MRI in the past which showed extensive abnormal signal  intensity in the pons.  I was informed by the ER physician that there was  concentration about sending the patient to a neurologist at [facility].   PAST MEDICAL HISTORY:  1.  Hypertension.  2.  Diabetes mellitus type 2.  3.  Bipolar disorder.  4.  Chronic obstructive pulmonary disease.  5.  Obstructive sleep apnea.  The patient does not use a CPAP regularly.  6.  Migraines.  7.  Hyperlipidemia.  8.  Episodic dizziness.  9.  Irritable bowel syndrome.  10. Diverticular disease.   MEDICATIONS:  1.  Neurontin 400 mg p.o. b.i.d.  2.  Topamax 25 mg p.o. at bedtime.  3.  Depakote 500 mg p.o. at bedtime.  4.  Zoloft 200 mg p.o. daily.  5.  Restoril 70 mg p.o. at bedtime.  6.  Lorazepam 1 mg p.o. q.i.d.  7.  Lisinopril 10 mg p.o. at bedtime.  8.  Nexium 40 mg p.o. daily.  9.  Lomotil one p.r.n.  10. Aspirin 81 mg p.r.n.  11. Restasis 0.05% one drop b.i.d.  12. Lipitor 10 mg p.o. daily.  13. Levabid 0.375 mg b.i.d.  14. Actonel 35 mg p.o. daily.  15. Plavix 75 mg p.o. daily.   ALLERGIES:  1.  KEFLEX.  2.  PYRIDIUM.   SOCIAL HISTORY:  She lives in her home.  She has never been married.  She  has one son who is age 45 who lives with her.  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  She used to be a heavy abuser of tobacco, but stopped about four  years ago.  She denies any alcohol or drug abuse.   FAMILY HISTORY:  Significant for mother dying at age 38 from an acute  myocardial infarction.  Mother had hypertension and diabetes.  Father died  in his 52s from acute myocardial infarction, but also was diabetic.  Her  son, 68, is a Psychiatric nurse and has hypertension.  Her brother also has  hypertension and diabetes.  Her sister died from cancer of the pancreas in  her 9s.   PHYSICAL EXAMINATION:  GENERAL:  The patient was conscious, alert,  comfortable, pleasant, not in any acute distress.  VITAL SIGNS:  Her blood pressure was 103/53, pulse of 73, respiratory rate  of 16, temperature 98.9 degrees Fahrenheit.  Oxygen saturation was 98% on  room air.  HEENT:  Normocephalic, atraumatic.  Oral mucosa was moist with no exudate.  NECK:  Supple, no jugular venous lymphadenopathy.  LUNGS:  Clear clinically with good air entry bilaterally.  HEART:  S1 and S2 was regular.  No S3, S4, gallops, or rubs.  ABDOMEN:  Soft, nontender, obese, bowel sounds were positive.  There was no  hepatosplenomegaly, no masses palpable.  EXTREMITIES:  No pitting pedal edema.  No calf tenderness was noted.  Pulses  bilaterally.  CNS EXAMINATION:  The patient was conscious and well oriented to time,  place, and person.  Her power in all extremities was 5/5.  Sensation was  intact.  The patient had a constant movement of her extremities, especially  more on the upper extremities than the lower extremities.  Her gait was not  examined.  Other cerebrovascular signs were absent, including tremors.  The  patient had no dysmetria.  She had no dysarthria.   LABORATORY DATA:  Her CT scan of the head was negative with no acute changes.  It was not changed from the CT scan from May 2005.  White  blood  cell count was 7, hemoglobin 9.6, hematocrit was 30.3, MCV was 69.6,  platelet count was 215.  Her sodium was 135, potassium 3.6, chloride 110,  CO2 was 22, glucose of 93, BUN of 14, creatinine 0.8, total bilirubin 1.4,  alkaline phosphatase 93, SGOT was 34, SGPT 35, total protein 6.6, albumin 3,  calcium 8.4.  Cardiac enzymes were negative.  Valproic acid was less than  10.  Urinalysis was negative.   ASSESSMENT AND PLAN:  1.  Ms. Ozdemir is an African-American female who has been admitted with      reported acute confusional state by family.  The patient appears to be      much better now.  She does not have any evidence of acute      cerebrovascular accident by clinical examination or by CT scan.  We will      admit her to the medical floor.  We will monitor her closely with neuro      checks.  I am concerned about her polypharmacy contribution to some of      her symptoms, and the patient needs a close evaluation of her      medications prior to discharge.  2.  Type 2 diabetes which appears to be quite well controlled.  Random blood      sugar was 93 this morning.  We will continue to follow this closely.  3.  History of dyslipidemia.  We will continue her on Lipitor.  4.  History of obstructive sleep apnea which she is not compliant with at      home.  However, see if we can arrange to do it here.  However, the      etiology of this is not known if the patient is not going to continue      using a long-term in the home setting.  5.  Bipolar disorder.  This also contributing to confusional state.  She is      on multiple medications for this.  We will continue to watch closely and      make changes as needed without compromising her mental function.  The      patient has had significant side effects from her medications, including      evidence of extrapyramidal side effects, such as akathisia and tardive      dyskinesia.  6.  Chronic obstructive pulmonary disease.  Seems to be  stable with no      evidence of acute exacerbation at this time.   I have explained the above plan to the patient who was agreeable to the  admission.  I also explained the role of the hospitalist team to her and her  family.   Note, that if the patient's symptoms worsen would attempt to make an  emergency transfer to [facility] neurology.  Otherwise, the  patient will be seen on Monday by her regular neurologist.  She was  initially scheduled for a MRI of her neck anyway on Monday.  We will try to  obtain this while she is an inpatient.       AM/MEDQ  D:  12/19/2003  T:  12/19/2003  Job:  [id number]   cc:   Milus Mallick. Lodema Hong, M.D.  [address]  [place], [place] [address]  Fax: [phone]

## 2010-07-29 NOTE — H&P (Signed)
NAME:  [name], [name]                  ACCOUNT NO.:  [id number]   MEDICAL RECORD NO.:  [id number]          PATIENT TYPE:  INP   LOCATION:  IC06                          FACILITY:  [facility]   PHYSICIAN:  Thomasenia Bottoms, MDDATE OF BIRTH:  1951-02-17   DATE OF ADMISSION:  01/19/2006  DATE OF DISCHARGE:  LH                                HISTORY & PHYSICAL   CHIEF COMPLAINT:  Altered level of consciousness.   HISTORY OF PRESENT ILLNESS:  [name] is a 60 year old woman who was brought  in by her care giver when the care giver found her with altered level of  consciousness at home. The patient is unable to provide any information, and  the care giver is not present at the time of my evaluation. Most of my  history comes from the emergency department record. The patient was quite  lethargic and definitely not at her baseline when the care giver arrived,  but they were able to bring her in by private vehicle. The patient has a  very hard time giving a history as she is quite lethargic, but she does tell  me that she has not had any fevers at home. She has not thrown up either.  She has some chronic diarrhea which is unchanged. She reports that her only  problem is that she has severe left side pain, but she is really unable to  give me any other specific or reliable information as she falls asleep after  saying one or two words.   Her past medical history per the records includes:  1. Bipolar disorder.  2. Depression.  3. Diabetes.  4. Diverticulitis.  5. Glaucoma.  6. Hypertension.  7. Sleep apnea.  8. History of stroke.  9. History of gait instability and back pain for which she sees a      neurologist.   Her medications listed from the ER sheet include:  1. Clonidine 0.1 mg p.o. at bedtime.  2. Depakote 250 mg p.o. 3 times a day.  3. Lisinopril 10 mg p.o. at bedtime.  4. Lorazepam 1 mg 4 times a day.  5. Singulair 10 mg p.o. daily.  6. Topamax 25 mg p.o. at bedtime.  7. Zetia  10 mg p.o., frequency not listed.  8. Plavix 75 mg p.o. daily.  9. Gabapentin 300 mg tablets, frequency not listed.  10.Metoprolol tartrate 25 mg p.o. once a day.  11.Dicyclomine 20 mg p.o. 3 times a day.  12.Sertraline 100 mg p.o. twice a day.  13.Skelaxin 800 mg as needed.  14.Omeprazole 20 mg p.o. daily.  15.Fentanyl patch 50 mcg to skin every 72 hours.   SHE IS ALLERGIC TO PENICILLIN, PYRIDIUM, KEFLEX AND MILK.   VITAL SIGNS:  Her temperature is 98.4, blood pressure 119/78, pulse 84,  respiratory rate 20. She is saturating 98% on 2 liters nasal cannula, and  her respiratory rate is 20.   REVIEW OF SYSTEMS:  Please see HPI. That is all the patient was able to  contribute reliably.   On physical examination, the patient is lying with her eyes  closed. She does  open her eyes when I call her name, but she is only able to keep her eyes  open for a few seconds before she drifts back.  NEUROLOGICAL EXAMINATION:  She does move each of her extremities to command  with 5/5 strength, no motor deficit noted. She had equivocal Babinski's  reflexes bilaterally, but she has faint but symmetric Achilles tendon  reflexes. Her sensory exam grossly is intact to light touch.  MUSCULOSKELETAL EXAMINATION:  The patient has mild tenderness on hip  abduction and adduction, but it is very mild and it is bilateral. Her range  of motion in her other joints is pretty good. She has no evidence of  effusion.  In general, the patient is lethargic but in no acute distress.  Her HEENT exam is normocephalic, atraumatic. Her pupils are dilated  bilaterally and sluggishly reactive. Her sclerae, nonicteric. Oral mucosa  slightly dry.  NECK:  Supple. No lymphadenopathy. No thyromegaly. No jugular venous  distention.  Her cardiac exam is regular rate and rhythm. No murmurs, rubs, or gallops.  Her lungs are clear to auscultation with no wheezes, rhonchi or rales.  Her abdomen reveals only very mild tenderness. No  rebound or guarding. She  does have bowel sounds. No masses are appreciated. When asked the patient to  tell me where she hurts, she holds the left side of her abdomen, but when  she is flat on her back, there is no significant tenderness to deep  palpation of that area. She seems to have more pain in the left back and  flank area than in her actual abdomen, and her pain seems somewhat relieved  when she lays flat on her back as opposed to when she was on her side.  EXTREMITIES:  Revealed no evidence of clubbing, cyanosis, or edema. She has  faintly palpable DP pulses bilaterally.  SKIN:  Is warm and dry with no open lesions or rashes.  NEUROLOGICAL:  As mentioned above.   DATA:  Her white count is 6.1, hemoglobin 9.7, hematocrit 30.2, platelet  count is 218. Urinalysis is essentially normal. Her urine is clear with a  normal pH, negative glucose, negative nitrate, negative leukocyte esterase.  Sodium is 142, potassium 3.8, chloride 111, bicarb 23, glucose was 14, BUN  27, creatinine 0.8. Total protein is 6.5, total bilirubin is 0.4, AST 55,  ALT 39, alkaline phosphatase is 94. Her blood alcohol level was  undetectable. Her tox screen is positive for benzodiazepines, and the  patient does take lorazepam every day.   ASSESSMENT AND PLAN:  1. Altered level of consciousness. Clinically, the patient appears to be      drugged. Her speech is slowed and somewhat slurred. She is lethargic,      but when she wakes up, she follows commands and is completely      appropriate.  She has no focal motor deficits on her examination      either. She tells me she has been hurting for approximately one week.      She said she saw her doctor yesterday. She also tells me that she has      gotten additional pain medication. I see on her medication list that      she is taking Skelaxin, and given her tox screen which is essentially     negative except for benzodiazepines, I wonder if it is not Skelaxin or       other muscle relaxers that have created this altered  level of      consciousness. The plan is to admit her to the intensive care unit and      monitor her carefully. The difficulty here is the patient is in a great      deal of pain, but I will try to hold her gabapentin for example,      definitely hold her Skelaxin, will be very measured with her pain      medication and will give at this point only some narcotics and NSAIDs      and cut out the other medicines. Will follow her mental status very      carefully and clearly try not to give much pain medicine; I just think      that is going to be very difficult to avoid. Of note, the patient has a      fentanyl patch on her list and says she has been on that, but she also      tells me she took it off. She was not wearing a fentanyl patch on      arrival in the emergency department. She said she stopped it herself      because she was frustrated as it was not working. So, this is a patient      with known back pain. Perhaps herself discontinuance of her fentanyl      patch has also created the increase in her pain. I am not going to      restart the patch at this time until her mental status clears just a      little bit more, so we will use p.r.n. Dilaudid as needed.  2. Side pain or back pain. Will just continue to monitor her at this      point. She certainly does not have an acute abdomen. She does not have      evidence of an urinary tract infection or pyelonephritis based on her      urinalysis. She denies any fall or trauma. I will check an x-ray of her      pubic area, hip, lower spine just to rule out compression fractures or      other things of that nature, but I suspect this is just a flare of her      chronic pain. We will monitor her very carefully and continued      diagnostic tests to be sure of this.  3. History of hypertension. Her blood pressures are slightly low in the      emergency department, so I am going to hold  her clonidine and will      continue her metoprolol and lisinopril as needed.  4. History of depression and anxiety. Will certainly continue her      Sertraline but will cautiously monitor giving her lorazepam which she      apparently takes 4 times a day. I will not stop that completely to      avoid withdrawal but will maybe give every other dose for now.  5. Neuropathy. I am going to hold her Topamax and Skelaxin and gabapentin      as mentioned above, and we will just continue to monitor this patient      in the intensive care unit carefully until her complete diagnosis is      known.      Thomasenia Bottoms, MD  Electronically Signed     CVC/MEDQ  D:  01/19/2006  T:  01/20/2006  Job:  [id number]   cc:   Milus Mallick. Lodema Hong, M.D.  Fax: [phone]

## 2010-07-29 NOTE — Procedures (Signed)
NAME:  Kasal, GENNETTE SHADIX                            ACCOUNT NO.:  [id number]   MEDICAL RECORD NO.:  [id number]                   PATIENT TYPE:  OUT   LOCATION:  RESP                                 FACILITY:  [facility]   PHYSICIAN:  Edward L. Juanetta Gosling, M.D.             DATE OF BIRTH:  03-16-1950   DATE OF PROCEDURE:  DATE OF DISCHARGE:  04/10/2003                              PULMONARY FUNCTION TEST   FINDINGS:  1. Spirometry shows a mild-to-moderate ventilatory defect with evidence of     air flow obstruction.  2. Lung volumes show mild restrictive change with total lung capacity at 77%     of predicted, but also shows air trapping.  3. DLCO is moderately reduced.  4. Arterial blood gasses are normal.  5. There is significant bronchodilator response.      ___________________________________________                                            Oneal Deputy. Juanetta Gosling, M.D.   ELH/MEDQ  D:  04/15/2003  T:  04/15/2003  Job:  [id number]   cc:   Milus Mallick. Lodema Hong, M.D.  [address]  [place], [place] [address]  Fax: [phone]

## 2010-07-29 NOTE — Group Therapy Note (Signed)
NAME:  [name], [name]                  ACCOUNT NO.:  [id number]   MEDICAL RECORD NO.:  [id number]           PATIENT TYPE:  INP   LOCATION:  A320                          FACILITY:  [facility]   PHYSICIAN:  Margaretmary Dys, M.D.DATE OF BIRTH:  Oct 25, 1950   DATE OF PROCEDURE:  10/01/2005  DATE OF DISCHARGE:                                   PROGRESS NOTE   SUBJECTIVE:  Patient feels slightly better and says she is less dizzy and  just has weakness on her left side.  Patient is here to see neurologist.  Patient is also here to see physical therapy.   OBJECTIVE:  GENERAL:  Conscious, alert, comfortable, not in acute distress.  VITAL SIGNS:  Blood pressure 130/75, pulse of 59, respirations 20,  temperature 97.1, oxygen saturation was 99% on room air.  HEENT EXAM:  Normocephalic, atraumatic.  Her oral mucosa was moist.  No  exudates.  NECK:  Supple.  LUNGS:  Clear.  HEART:  S1, S2 regular.  ABDOMEN:  Soft.  EXTREMITIES:  No edema.   My physical exam is unchanged from time of admission.   LABORATORY DATA:  White blood cell count 7.1, hemoglobin of 11.7, hematocrit  of 36, Plt count was 244 with no left shift.  Sodium 139, potassium 4.1,  chloride of 106, CO2 27, glucose 131, BUN of 14, creatinine 0.8.  Stool  occult blood x1 was negative.   ASSESSMENT AND PLAN:  Problem 1.  Patient with dizziness, history of TIAs.  I suspect this is more likely due to physical deconditioning.  Patient has a  hemiplegia from a previous CVA and continues to use a cane.  I think we need  physical therapy to help [name] out here.  Patient may need a walker to achieve  better stability and balance.   Problem 2.  Recurrent TIAs.  Patient is on Plavix, appears stable.  Awaiting  input by Dr. Gerilyn Pilgrim.  I am also awaiting official report of a MRI.   DISPOSITION:  Overall patient remains stable.  We need to draw up a plan as  to her continued physical therapy and rehab.  We will continue all her other  medications.   Her blood sugars are in fairly satisfactory range.  Await  neurology input.     Margaretmary Dys, M.D.  Electronically Signed    AM/MEDQ  D:  10/01/2005  T:  10/01/2005  Job:  [id number]

## 2010-07-29 NOTE — Procedures (Signed)
[facility]  Patient:    [name], [name] Visit Number: [id number] MRN: [id number]          Service Type: END Location: ENDO Attending Physician:  Mervin Hack Dictated by:   Hedwig Morton. Juanda Chance, M.D. [facility] Admit Date:  10/01/2001 Discharge Date: 10/01/2001                             Procedure Report  PROCEDURE:  Colonoscopy.  INDICATIONS:  This 60 year old African-American female has had chronic diarrhea as well as periumbilical abdominal pain.  She was evaluated in the past, but no definite diagnosis has been made.  The patient has been treated for irritable bowel syndrome but has not responded to the treatment.  She has recently had more diarrhea, even nocturnally, with some fecal urgency and incontinence.  We are doing a colonoscopy to obtain random biopsies to rule out microscopic collagenous or lymphocytic colitis.  INSTRUMENT:  Olympus single-channel videoscope.  PREMEDICATION:  Versed 7 mg IV, Demerol 100 mg IV.  DESCRIPTION OF PROCEDURE:  The Olympus single-channel videoendoscope was passed under direct vision through the rectum to the sigmoid colon.  The patient was monitored by pulse oximetry.  Oxygen saturations were satisfactory.  Her prep was adequate.  Rectal tone was mildly decreased. There were no internal hemorrhoids.  Through the sigmoid colon the mucosa appeared normal, but there were only a few shallow diverticula.  These were not clinically significant.  The colon did not appear to be redundant or tortuous. The colonoscope passed easily through the sigmoid colon to the descending colon which appeared normal.  Submucosal vasculature appeared normal.  There was no granularity or evidence of colitis.  Splenic flexure, transverse colon, hepatic flexure, and ascending colon were unremarkable.  Cecal pouch was reached without difficulty and showed normal appendiceal opening and ileocecal valve.  Colonoscope was then retracted  slowly from the right, transverse to left colon, and multiple biopsies were taken randomly from the right, transverse and left colon.  The patient tolerated the procedure well.  No polyps were found.  IMPRESSION: 1. Mild diverticulosis of the left colon. 2. Normal-appearing mucosa status post random biopsies.  PLAN: 1. The patient will be again treated with antispasmodics and antidiarrheal    agents, high-fiber diet. 2. We will await results of the biopsies. Dictated by:   Hedwig Morton. Juanda Chance, M.D. [facility] Attending Physician:  Mervin Hack DD:  10/01/01 TD:  10/04/01 Job: [id number]

## 2010-07-29 NOTE — Consult Note (Signed)
NAME:  [name], [name]                      ACCOUNT NO.:  0   MEDICAL RECORD NO.:  [id number]           PATIENT TYPE:   LOCATION:                                 FACILITY:   PHYSICIAN:  Lionel December, M.D.    DATE OF BIRTH:  1950/08/29   DATE OF CONSULTATION:  05/26/2005  DATE OF DISCHARGE:                                   CONSULTATION   REFERRING PHYSICIAN:  Milus Mallick. Lodema Hong, M.D.   REASON FOR CONSULTATION:  Disabling diarrhea of 4 weeks' duration.   HISTORY OF PRESENT ILLNESS:  [name] is a 60 year old, African-American female  who was referred through the courtesy of Dr. Syliva Overman for evaluation  of disabling diarrhea which started about 1 month ago.  The patient states  that he has had diarrhea off and on for many years.  She has had extensive  evaluation in [place] and has been felt to have IBS, however, she has  been having more problems for the last 1 month.  She has had at least 6-7  stools per day.  Stools are loose to pasty.  She denies rectal bleeding or  melena.  She has had multiple accidents.  She has had some accidents at  night in her bed.  She eats and she has to run to the bathroom.  At times,  she will pass stool with urination.  Her appetite has been up and down.  She  also complains of mid and lower abdominal cramps that she describes as  stomach boils up and pulling sensation.  She has not lost any weight  recently.  She states she has been on Levbid x2 years.  She took OTC Imodium  and now she is on Lomotil eight tablets a day for which she cannot tell any  difference.  She denies any fever or chills.  She was given prescription for  metronidazole by Dr. Lodema Hong.  It has not helped the diarrhea, but has given  her nausea and she has maybe 2 more days worth of medicine left.  She states  her heartburn is well-controlled with medication.  She believes she has been  on Prilosec for at least 1 year.  There is no history of antibiotic use  prior to onset  of use of diarrhea.  She had stool studies by Dr. Lodema Hong  earlier in the week which are still pending.   CURRENT MEDICATIONS:  1.  Gabapentin 4 mg b.i.d.  2.  Lorazepam 1 mg four times a day.  3.  Lisinopril 10 mg daily.  4.  Topamax 25 mg b.i.d.  5.  Phenergan 25 mg b.i.d. p.r.n.  6.  Flagyl 250 mg four times a day.  7.  NitroQuick 0.4 mg sublingual p.r.n.  8.  Ambien 10 mg nightly.  9.  Depakote 250 mg t.i.d.  10. Prilosec 40 mg q.a.m.  11. Metoprolol 25 mg b.i.d.  12. Plavix 75 mg daily.  13. Singulair 10 mg daily.  14. Lomotil two tablets four times a day.  15. Zoloft 200 mg daily.  16.  Levbid 1 b.i.d.  17. Clonidine 0.1 mg nightly.  18. Aricept 10 mg daily.  19. Caltrate with D 600 mg two tablets a day.  20. Tussin cough syrup p.r.n.   PAST MEDICAL HISTORY:  1.  Hypertension.  2.  COPD.  3.  Obstructive sleep apnea.  4.  Migraine.  5.  History of IBS as above.  6.  Chronic GERD.  7.  Small hernia on prior EGD.  8.  Diabetes mellitus.  9.  Chronic diarrhea felt to be due to IBS.  10. History of iron deficiency anemia.  She has undergone multiple      evaluations in [place].  11. EGD in December 2001, by Dr. Lina Sar, which was within normal      limits.  12. Colonoscopy in July 1999, revealing mild diverticulosis of left colon.  13. In July 2003, she had another colonoscopy revealing left-sided      diverticula with normal looking mucosa.  She did have biopsy in 1999,      results of which are not available.  I presume it did not show      microscopic or collagenous colitis.  14. Colonoscopy July 2005.  15. Colonoscopy in February 2001.  16. The patient has been given IV infusion of iron in the past.  17. History of anxiety neurosis and mini-strokes.   ALLERGIES:  PENICILLIN, KEFLEX and PYRIDIUM.   PAST SURGICAL HISTORY:  1.  Cholecystectomy.  2.  Hysterectomy with oophorectomy at a later date.  3.  Bilateral breast reduction.   FAMILY HISTORY:   Mother died of MI in her 78s and father died in his 68s of  diabetic complications.  She has six sisters and three brothers living.  One  sister died of stomach carcinoma at age 65.  One maternal aunt had colon  carcinoma in her 15s.   SOCIAL HISTORY:  She is divorced.  She lives with her son.  She is now  disabled.  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  She smoked cigarettes off and on  x25 years, but quit 5 years ago and she does not drink alcohol.   PHYSICAL EXAMINATION:  GENERAL:  Pleasant, well-developed, mildly obese,  African-American female who was in no acute distress.  VITAL SIGNS:  Weight 169 pounds.  Height 5 feet 2 inches tall.  Pulse 76 per  minute, blood pressure 128/64, temperature 98.2.  HEENT:  Conjunctivae pink, sclerae nonicteric.  Oropharyngeal mucosa is  normal.  She has upper dental plate, but edentulous in lower jaw.  NECK:  No neck masses or thyromegaly noted.  CARDIAC:  Regular rhythm, normal S1, S2.  No murmurs, rubs or gallops noted.  LUNGS:  Clear to auscultation.  ABDOMEN:  Protuberant.  Bowel sounds are normal on palpation, soft with mild  tenderness at LLQ and periumbilical area, but no organomegaly or masses  noted.  RECTAL:  Normal sphincter tone.  She has a scant amount of stool in the  vault which is heme negative.  No peripheral edema or clubbing noted.   LABORATORY DATA AND X-RAY FINDINGS:  Labs from Dr. Anthony Sar office on May 17, 2005, showed WBC 5.7, H&H 10.3 and 35.3, platelet count 192,000, MCV  74.6.  Her sodium was 142, potassium 4.3, chloride 109, CO2 24, glucose 89,  BUN 21, creatinine 0.8, calcium 8.4.  Her cholesterol was 177.  Bilirubin  0.4, Alk  phos 89, AST 46, ALT 34, albumin 3.5.  TSH 30.059.   ASSESSMENT:  [name] is a 25 year old, African-American female with multiple medical problems who has had diarrhea for several years, but lately was  doing well with  antispasmodic therapy who has had change in her bowel  movements about 1 month ago.  She is having at least 6-7 stools per day.  She has had incontinence as well as accidents.  She is also complaining of  crampy, mid and lower abdominal pain.  However, she has not experienced any  fever, rectal bleeding, nausea or vomiting and she is maintaining her  weight.  Stool studies are pending.  It is reassuring to see that recent  electrolytes, BUN and creatinine were normal and albumin was also low  normal.  She has microcytic anemia which is probably chronic due to iron  and/or chronic disease.  She has undergone multiple colonoscopies in the  past, most recent of which was in 2003, and did not reveal endoscopic  colitis.  I believe prior sigmoid colon biopsy was negative for microscopic  or collagenous colitis.   It is possible that she just has flare-up of her irritable bowel syndrome,  but enteric infection needs to be ruled out as well as diarrhea due to one  of her medicines.  She does not appear to be toxic or dehydrated and  therefore, can be managed on an outpatient basis.   RECOMMENDATIONS:  1.  The patient is advised to discontinue Prilosec, Flagyl, Lomotil and      Levbid.  2.  Will start on Aciphex 20 mg p.o. q.a.m. (samples given).  3.  Dicyclomine 20 mg before each meal and Imodium 2 mg before each meal or      three times a day.  4.  She will keep a stool diarrhea.  5.  We will wait for results of stool studies.  6.  She will return in 2 weeks.  Unless she shows dramatic improvement, will      consider repeating her colonoscopy with biopsies prior to considering      small bowel follow through and eventually therapy with Lotronex if it      turns out to be refractory irritable bowel syndrome.   I would like to thank Dr. Lodema Hong for allowing [name] to take part in the care  of this patient.      Lionel December, M.D.  Electronically Signed     NR/MEDQ  D:  05/26/2005  T:   05/27/2005  Job:  [id number]

## 2010-07-29 NOTE — H&P (Signed)
NAME:  Gartland, Jayna                  ACCOUNT NO.:  [id number]   MEDICAL RECORD NO.:  [id number]          PATIENT TYPE:  INP   LOCATION:  A216                          FACILITY:  [facility]   PHYSICIAN:  Margaretmary Dys, M.D.DATE OF BIRTH:  03-31-1950   DATE OF ADMISSION:  09/29/2005  DATE OF DISCHARGE:  LH                                HISTORY & PHYSICAL   Primary care physician is Syliva Overman, M.D.   ADMITTING DIAGNOSES:  1. Dizziness.  2. Generalized weakness.  3. Recurrent falls.  4. Physical deconditioning.  5. Transient ischemic attacks.   CHIEF COMPLAINT:  Recurrent falls and weakness of several weeks' duration.   HOSPITAL COURSE:  Ms. Deeg is a 60 year old African-American female who  presented to the emergency room with complaints of dizziness.  The patient  and the daughter, who provided some of the history reported that she has  been falling a lot in the past few weeks and especially so in the last one  week.  She has noticed increased weakness on the left and seems to fall to  her left more.  The patient denies any loss of consciousness or any seizure  activity.  She denies any fecal or urinary incontinence.  The patient has  had TIAs in the past and actually had CVA with residual weakness on the left  side.  She says she tries to balance herself with a cane.  The patient does  not use a walker.  She said she is able to get up by herself and sometimes  her son helps her up but in the last episodes she has been unable to even  get up, and the son has been unable to help her get up.  She denies any aura  prior to these episodes occurring.  She denies any fecal or urinary  incontinence.  She has been seen by Dr. Gerilyn Pilgrim who was of the opinion that  most of the weakness on the left side was from a previous stroke and that  she will need physical therapy.  The patient was also started on Plavix to  reduce these episodes.   Evaluation revealed that she was mostly stable  but due to her recurrent  falls and lack of stability and probable physical deconditioning, the  patient is now being admitted so that we can work up a plan in terms of  physical therapy and additional neurologic examination if indicated.  I  reviewed an MRI from three months ago which has concerns for probable  chronic demyelinating disease.  I think she will need followup.   REVIEW OF SYSTEMS:  She denies any weight loss.  No shortness of breath.  No  chest pain.  No nausea, vomiting or abdominal pain.  No fevers or chills.  No neck stiffness.  No frequency, urgency or dysuria.  No incontinence.  She  does have some back pain with bruising from where she has had her falls.  She has residual left-sided weakness from prior strokes.   PAST MEDICAL HISTORY:  1. Hypertension.  2. COPD.  3. Obstructive sleep  apnea.  4. Migraine.  5. History of irritable bowel syndrome.  6. Chronic esophageal reflux disease.  7. History of hiatal hernia from a prior EGD.  8. Diabetes mellitus.  9. Chronic diarrhea due to IBS.  10.Iron-deficiency anemia with multiple evaluations.  EGD in December of      2001 which was said to be normal.  11.History of diverticulitis.  12.History of anxiety neurosis.  13.History of TIAs.   MEDICATIONS:  She is on clonidine 0.1 mg p.o. q.h.s., Depakote 250 mg p.o.  t.i.d., lisinopril 10 mg p.o. q.h.s., lorazepam 1 mg p.o. q.i.d., Neurontin  400 mg p.o. q.i.d., Singulair 10 mg p.o. once a day, Topamax 25 mg p.o. at  bedtime, Zoloft 200 mg p.o. once a day, Ambien 10 mg p.o. at bedtime, Zetia  10 mg p.o. once a day, Plavix 75 mg p.o. once a day.   ALLERGIES:  The patient reports allergies to PENICILLIN, KEFLEX and  PYRIDIUM.   PAST SURGICAL HISTORY:  1. Cholecystectomy.  2. Hysterectomy with oophorectomy.  3. Bilateral breast reduction.   FAMILY HISTORY:  Mother died of MI in her 89s and father died in his 82s of  diabetic complications.  She has six sisters and  three brothers living.  A  sister died of stomach carcinoma at the age of 73.  One maternal aunt had  colon cancer in her 69s.   SOCIAL HISTORY:  She is divorced.  She lives with her son.  She is disabled.  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  She smoked cigarettes on and off for 25 years but quit about 5 years ago and  does not drink alcohol.   PHYSICAL EXAMINATION:  GENERAL:  The patient was conscious a lot,  comfortable, not in acute distress.  She did appear to have what was likely  tardive dyskinesia.  VITAL SIGNS ON ARRIVAL IN ER:  Blood pressure 142/79, pulse 85, respirations  20, temperature 98.4.  Oxygen saturation was 96% on room air.  HEENT:  Normocephalic, atraumatic.  Oral mucosa was moist with no exudates.  NECK:  Supple.  No JVD.  No lymphadenopathy.  LUNGS:  Clear clinically with good air entry bilaterally.  HEART:  S1 and S2 regular.  No S3, S4, gallops or rubs.  ABDOMEN:  Soft and nontender.  Bowel sounds were positive.  No masses  palpable.  EXTREMITIES:  No edema.  No induration or tenderness was noted.  CNS:  The patient was conscious a lot, well oriented to time, place and  person.  The patient does have evidence of weakness in the left arm and left  leg.  Power was 3-4 out of 5 in the left arm and 4 out of 5 in the left leg.  Reflexes were intact.  The patient is unable to be awake on that left side.  Pupils were equal and reactive to light and accommodation.   LABORATORY AND DIAGNOSTIC DATA:  A venous blood gas showed a pH of 7.306,  PCO2 50.2, bicarbonate 25 with a base deficit of 2.  Hemoglobin of 13.6,  hematocrit of 40.  Sodium was 141, potassium 3.9, chloride 110, glucose 117,  BUN 22, creatinine 0.9.  Thyroid levels were 1.463.  Vitamin B-12 was 925.  Serum Depakote level was 49.  Urinalysis was negative.  Noncontrasted CT  scan of head showed no evidence of acute bleed.  When I reviewed an MRI dated July 05, 2005,  the  patient had extensive  abnormal signal intensity noted throughout the pons.  It was noted to be  stable from 2004 though.  There was concern for probable chronic  demyelinating process or sequela of central pontine myelinolysis or atypical  metabolic process.   ASSESSMENT/PLAN:  Ms. Cabeza is a 60 year old African-American female who  presented to the emergency room with recurrent falls, dizziness and  generalized weakness.  These falls did not seem to be seizure activities.  They seemed to be accidental falls due to poor balance and physical  deconditioning.  The patient has had TIAs in the past, but she said the  symptoms are not similar.  I have reviewed an MRI from April 2007 which  showed a probable chronic demyelinating illness.  We will wonder if this is  worsening.  The plan is to admit her to 2A at this time.  We will put her on  telemetry to see if she has any abnormal rhythms.  We will get an MRI of the  head with an MRA to follow up on the previous MRI from April.  We will  resume all her home medications.  We will request a neurology consult.   We will put her on fall precautions.  We will only advance activity as  recommended by Physical Therapy and Occupational Therapy.  I suspect that  the patient may need a walker to further enhance stability.  We will defer  and await PT/OT recommendations.  Otherwise, she remains stable.  Because of  the complaint of some back pain, we will give her some analgesia.  If the  back pain persists, we may get imaging studies to rule out evidence of  fractures of the back.  Otherwise, the patient is stable.      Margaretmary Dys, M.D.  Electronically Signed     AM/MEDQ  D:  09/30/2005  T:  09/30/2005  Job:  [id number]   cc:   Milus Mallick. Lodema Hong, M.D.  Fax: [phone]

## 2010-07-29 NOTE — Discharge Summary (Signed)
NAME:  [name], [name]                  ACCOUNT NO.:  [id number]   MEDICAL RECORD NO.:  [id number]          PATIENT TYPE:  INP   LOCATION:  A322                          FACILITY:  [facility]   PHYSICIAN:  Osvaldo Shipper, MD     DATE OF BIRTH:  1950-05-13   DATE OF ADMISSION:  05/01/2006  DATE OF DISCHARGE:  02/25/2008LH                               DISCHARGE SUMMARY   Please review H&P dictated by Dr. Lilian Kapur for details regarding  patient's presenting illness.   DISCHARGE DIAGNOSES:  1. Altered mental status secondary to medications.  2. Mild acute pancreatitis thought to be secondary to Depakote.  3. Hypertension.  4. Bipolar disorder.  5. Possible irritable bowel syndrome.  6. History of migraine headaches, all stable.   BRIEF HOSPITAL COURSE:  Briefly, this is a 60 year old African-American  female who presented to the ED with complaints of mental status changes.  She was apparently at [facility] on the day of  admission when she was found to be confused and lethargic.  She was  brought in to the ED here and it was found that possibly her mental  status changes were related to some of her medications.  When she was  kept off of these medications she improved.   Patient was also complaining of some nausea and some nonspecific  abdominal pains.  We did obtain blood work initially which showed that  her LFTs were mildly abnormal.  Her amylase, lipase, however, on day 1  was normal.  On the following day her lipase went up to 69 but her  amylase was normal.  Since she was having these nonspecific symptoms she  initially underwent ultrasound of the abdomen which did not show any  significant findings.  Because the symptoms persisted, we obtained a CAT  scan of her abdomen and pelvis which showed prominent pancreas with ill-  defined margins and suggestion of early inflammation, suspicious for  early acute pancreatitis.  This prompted a GI consultation who felt that  her symptoms could be coming from Depakote, which does have a black box  warning of pancreatitis.  We have been titrating her dose since then.  She is now down to b.i.d. dosing.  She is on the Depakote for her  bipolar.  I have asked her to follow up with her psychiatrist as soon as  possible so that alternative medications may be resumed for this  patient.  She, however, will also need an EUS as an outpatient to make  sure there is no other concerning lesions in the pancreas.  This will be  arranged by GI.   Her other medical issues pretty much remained stable during this  admission.  She did have mild anemia which was also chronic.  Her LFTs  also showed some improvement while she was in the hospital.  Lipase also  came down to normal.   DISCHARGE MEDICATIONS:  Depakote 250 mg b.i.d.   She has been asked to continue her other outpatient medications as  before, in close consultation with her PMD and her psychiatrist.  We  have explained to her that some of the mental status changes may have  been related to the medications that she is on.  She understands and  will discuss with her PMD as soon as possible.   DIET:  She can have a heart healthy diet.   PHYSICAL ACTIVITY:  She uses a 4-prong cane to ambulate which she will  continue.   OTHER STUDIES DONE:  1. A CT of the head which was unremarkable.  2. An x-ray also was done which was also unremarkable.   TOTAL TIME AT DISCHARGE:  Forty minutes.      Osvaldo Shipper, MD  Electronically Signed     GK/MEDQ  D:  05/07/2006  T:  05/07/2006  Job:  [id number]   cc:   Milus Mallick. Lodema Hong, M.D.  Fax: [phone]   Kassie Mends, M.D.  [address]  [place] , [place] [address]

## 2010-07-29 NOTE — H&P (Signed)
NAME:  [name], [name]                  ACCOUNT NO.:  [id number]   MEDICAL RECORD NO.:  [id number]          PATIENT TYPE:  INP   LOCATION:  A218                          FACILITY:  [facility]   PHYSICIAN:  Gardiner Barefoot, MD    DATE OF BIRTH:  Dec 29, 1950   DATE OF ADMISSION:  06/10/2006  DATE OF DISCHARGE:  LH                              HISTORY & PHYSICAL   PRIMARY CARE PHYSICIAN:  Milus Mallick. Lodema Hong, M.D.   CHIEF COMPLAINT:  Altered mental status.   HISTORY OF PRESENT ILLNESS:  This is a 60 year old female, with a  history of bipolar disorder, who was found unresponsive at home from an  unwitnessed event.  It was reported as being acute onset with no  precipitating cause.  Of note, the patient was recently hospitalized in  February of 2008 for a similar presentation felt secondary to toxicity  from her Depakote which was being weaned.  Also there was some  suggestion of pancreatitis at that time.  The patient otherwise has had  no recent complaints per report of family member who is not presently  here.  No fever, nausea, vomiting or other significant events.  The  patient does have a long list of medications including many that can be  sedating and unclear if there are any kind of overdose.   PAST MEDICAL HISTORY:  1. Diabetes.  2. Hypertension.  3. Bipolar disorder.  4. Diverticulosis.  5. Glaucoma.  6. Migraine.  7. Sleep apnea.  8. Stroke.  9. Chronic back pain.   MEDICATIONS:  1. Include zolpidem 10 mg p.o.  2. Zetia 10 mg p.o. daily.  3. Phenergan 25 mg p.r.n.  4. Gabapentin 300 mg three times a day.  5. Metoprolol 12.5 mg twice a day.  6. Lorazepam 1 mg 4 times a day.  7. Sertraline 200 mg once a day.  8. Prilosec 20 mg once a day.  9. Depakote 500 mg twice a day, was discharged in February with 250 mg      twice a day with the goal of weaning.  10.Plavix 75 mg daily.  11.Clonidine 0.1 mg q.h.s.  12.Lisinopril 10 mg daily.  13.Singulair 10 mg daily.  14.Topamax  25 mg twice a day.  15.Bentyl p.o. 20 mg three times a day.   FAMILY HISTORY:  CAD with diabetes and hypertension.   PAST SURGICAL HISTORY:  Includes carpal tunnel surgery, cholecystectomy,  hysterectomy and breast reduction.   SOCIAL HISTORY:  No known alcohol or drug use.  Is a former smoker.  All  of this was in review of the record.   ALLERGIES:  1. PENICILLIN.  2. PERIDIUM.  3. KEFLEX.   REVIEW OF SYSTEMS:  Unobtainable.   PHYSICAL EXAMINATION:  VITALS:  Temperature is 97, pulse 67, blood  pressure 132/63, respirations 20, O2 saturation 99% on room air.  GENERAL:  The patient is somnolent, follows simple commands including  opening eyes and moving extremities.  Does answer questions or answers  appropriately.  She is presently in a C-collar and on board.  HEENT:  Anicteric.  CARDIOVASCULAR:  Regular rate and rhythm with no murmurs, gallops, or  rubs.  LUNGS:  Clear to auscultation bilaterally.  ABDOMEN:  Soft, mild distention, nontender with positive bowel sounds.  No hepatosplenomegaly.  EXTREMITIES:  With no clubbing, cyanosis, or edema.   LABORATORY DATA:  CBC with mild anemia, alcohol level of less than 0.5.  BMET with mild hypokalemia.  CT scan of the head and C-spine is negative  for any acute disease.   ASSESSMENT/PLAN:  1. Altered mental status:  At this point, there is no obvious      etiology.  Certainly as past concern with the Depakote was a      possibility, we will check her Depakote level to see if she is      currently truly taking a therapeutic dose or if it is being weaned      as previously described.  We will also check her pancreatic enzymes      with mild distention of her abdomen and are awaiting the urine drug      screen.  We will hold any p.o. medicines at this time until she is      officially awake and we will avoid any sedating drugs overnight.      Tonight as it is unclear at this time what caused her lethargy.  2. Mild abdominal  distention:  We will check pancreatic enzymes.  3. Hypertension:  We will hold off her on p.o. medicines and treat      only symptomatically.  4. Bipolar disorder:  We will restart the patient's medicine when she      begins to wake up.  5. Coronary artery disease:  We will continue with the patient's      Plavix in the morning as it is likely that she has a history of a      myocardial infarction, although I do not see it in her electronic      medical records at this time.   DISPOSITION:  The patient is a full code at this time.      Gardiner Barefoot, MD  Electronically Signed     RWC/MEDQ  D:  06/10/2006  T:  06/11/2006  Job:  [id number]

## 2010-07-29 NOTE — Discharge Summary (Signed)
NAME:  [name], [name]                  ACCOUNT NO.:  [id number]   MEDICAL RECORD NO.:  [id number]          PATIENT TYPE:  INP   LOCATION:  A320                          FACILITY:  [facility]   PHYSICIAN:  Osvaldo Shipper, MD     DATE OF BIRTH:  [birth year]   DATE OF ADMISSION:  09/29/2005  DATE OF DISCHARGE:  07/26/2007LH                                 DISCHARGE SUMMARY   PRIMARY CARE PHYSICIAN:  Milus Mallick. Lodema Hong, M.D.   DISCHARGE DIAGNOSES:  1.  Physical deconditioning.  2.  Frequent falls as a result of physical deconditioning as well as      medications.  3.  Chronic back pain.  4.  Mild spinal stenosis and lumbar disc protrusions.  5.  History of transient ischemic attacks.   Please review H&P dictated at the time of admission for details regarding  the patient's presenting illness.   BRIEF HOSPITAL COURSE:  Frequent falls.  The patient was admitted to the  hospital because of her complaints of dizziness and frequent falls.  She has  history of CVA with right-sided weakness.  The patient underwent an  extensive evaluation during her hospital stay which included a CT of the  head which did not show any acute abnormality.  Subsequently MRI of the  brain was done which showed persistent extensive altered signal intensity  within the pons which was similar to previous exams without evidence for  acute infarct.  MRA of the head showed some moderate disease intracranially  but nothing of concern.  She also underwent lumbar spine film which was  negative.  She underwent carotid Doppler study which also was unremarkable.  Neurological consultation obtained from Dr. Gerilyn Pilgrim who felt that her  presentation was similar to previous evaluation two years ago.  She was  thought to have multifactorial gait impairment.  One of the factors which  was felt to be important was medications.  She was on Depakote high doses,  Neurontin high doses and Topamax.  She is also on high doses of Zoloft.  Hence, based on his recommendation the Neurontin was decreased and Depakote  was discontinued.  The patient was seen by physical therapy who was able to  ambulate the patient, although she was having short steps gait.  However, PT  did mention at times she was able to ambulate quite normally.  Finally, Dr.  Gerilyn Pilgrim because of her low back pain ordered MRI of the lumbar spine which  showed some disk protrusion on the left side at L3-4 and L4-5 and mild  spinal stenosis at L4-5.  No acute bony abnormality was noted.  No evidence  for spinal cord compression was noted.  I think the patient's symptoms are  chronic, somewhat functional, and possibly also related to medication use.  She has had multiple evaluations in the form of MRIs currently as well as in  the past which have failed to reveal any reversible or organic problem.  I  think she may benefit from home health PT which will be arranged for her.   I have asked her to  discuss her psychiatric problems with her psychiatrist  and have said to communicate to him that we have discontinued Depakote for  reasons stated above.   Hypertension was noted in this patient.  Started on metoprolol apart from  her lisinopril that she was taking.   The patient's blood work showed some anemia for which she underwent some  stool for occult blood testing which was unremarkable.  Her H&H remained  stable though.  B12 was okay.  TSH was 1.46.  Other blood work was all  pretty unremarkable.   On the day of discharge, the patient was again having her complaints of back  pain and not feeling well; however, she was ambulated well even though she  needed a cane.  She was eating well, and vital signs were all stable.  Hence, we decided to discharge this patient home with followup with PMD and  home health PT.   DISCHARGE MEDICATIONS:  1.  Changed dosage includes Neurontin 300 mg t.i.d.  2.  We have discontinued Depakote.  3.  We have started her on  Lopressor 50 mg b.i.d.  4.  Otherwise continue Topamax, Zoloft, lorazepam, lisinopril, Plavix, and      clonidine as before.   Followup with PMD in 1 to 2 weeks.   Home health PT will be arranged.   Diet as before.  Physical activity as before.   CONSULTATIONS:  Kofi A. Gerilyn Pilgrim, M.D.   IMAGING STUDIES:  CT head, MRI brain, MRA brain, MRI L-spine, Carotid  Dopplers, all of which have been discussed above.      Osvaldo Shipper, MD  Electronically Signed     GK/MEDQ  D:  10/05/2005  T:  10/05/2005  Job:  [id number]   cc:   Milus Mallick. Lodema Hong, M.D.  Fax: [phone]   Kofi A. Gerilyn Pilgrim, M.D.  Fax: [phone]

## 2010-07-29 NOTE — H&P (Signed)
NAME:  [name], [name]                  ACCOUNT NO.:  [id number]   MEDICAL RECORD NO.:  [id number]          PATIENT TYPE:  AMB   LOCATION:  DAY                           FACILITY:  [facility]   PHYSICIAN:  Vickki Hearing, M.D.DATE OF BIRTH:  03-17-1950   DATE OF ADMISSION:  DATE OF DISCHARGE:  LH                                HISTORY & PHYSICAL   CHIEF COMPLAINT:  Carpal tunnel syndrome on the left and right carpal tunnel  release.   HISTORY:  This is a 60 year old female who has documented carpal tunnel  syndrome by EMG nerve conduction studies.  Her tests showed severe bilateral  carpal tunnel syndrome, I have already done the right upper extremity, she  is now back to have the left side done.  She has weakness in her upper  extremities, cannot hold onto things, she has trouble with activities of  daily living as well as nocturnal pain.  She has allergy to [facility].   REVIEW OF SYSTEMS:  Weight gain, shortness of breath, diarrhea, nausea,  headache, dizziness, migraine, numbness, weakness, tremor, osteoporosis,  depression, mood swings, bipolar disease, sinusitis/sinus problems.  Medical  problems with mini stroke, irritable bowel, gastric ulcer, depression,  hypertension, bipolar, hysterectomy, cholecystectomy, ovarian cystectomy,  D&C.   MEDICATIONS:  Zetia, Prilosec, Neurontin, lorazepam, Depakote, Singulair,  __________ , Advair, lisinopril, Lomotil, Lipitor.  Restoril, Zoloft and  clonidine.   FAMILY HISTORY:  Heart disease, arthritis, cancer, asthma, diabetes.   SOCIAL HISTORY:  Single, disabled, does not smoke or drink, caffeine use  minimal.   EXAMINATION:  VITAL SIGNS:  Weight 169.  GENERAL:  Development, grooming, hygiene, nutrition normal.  CARDIOVASCULAR:  No swelling.  Normal pulses.  LYMPHATICS SYSTEM:  Negative.  SKIN:  No ulcerations.  Previous incision on the right looks good.  PSYCHIATRIC:  She is alert and oriented x3.  Mood is pleasant.  NEUROLOGIC:  Signs  show decreased sensation in the median nerve  distribution.   DIAGNOSIS:  Left carpal tunnel syndrome.   PLAN:  Left carpal tunnel open release.       SEH/MEDQ  D:  09/01/2004  T:  09/01/2004  Job:  [id number]

## 2010-07-29 NOTE — Op Note (Signed)
NAME:  [name], [name]                  ACCOUNT NO.:  [id number]   MEDICAL RECORD NO.:  [id number]          PATIENT TYPE:  AMB   LOCATION:  DAY                           FACILITY:  [facility]   PHYSICIAN:  Vickki Hearing, M.D.DATE OF BIRTH:  07-Aug-1950   DATE OF PROCEDURE:  09/02/2004  DATE OF DISCHARGE:                                 OPERATIVE REPORT   PREOPERATIVE DIAGNOSIS:  Carpal tunnel syndrome left wrist and hand.   POSTOPERATIVE DIAGNOSIS:  Carpal tunnel syndrome left wrist and hand.   PROCEDURE:  Left carpal tunnel release.   OPERATIVE FINDINGS:  Compression of left median nerve.   SURGEON:  Dr. Romeo Apple, no assistants.   ANESTHETIC:  Bier block.   INDICATIONS:  Positive nerve conduction study for carpal tunnel syndrome  with sensory loss, weakness and activity of daily living dysfunction of the  left upper extremity consistent with carpal tunnel syndrome.   The patient was identified in preop holding area as [name]. Her left  wrist was marked as the surgical site, countersigned by the surgeon. She was  given antibiotics. Her history and physical was updated. She was taken to  the operating room after this and was given a Bier block. Once that was  established and satisfactory, her left upper extremity was prepped and  draped using sterile technique. A time-out was taken. The parameters of the  time-out were completed as required.   An incision was made over the carpal tunnel, taken down to the palmar fascia  which was divided down to the transverse carpal ligament. Blunt dissection  was carried out beneath the ligament, and the transverse carpal ligament was  released. The contents of the carpal tunnel had a significant amount of  synovitis. There was compression of the median nerve.   The wound was irrigated and closed with interrupted nylon sutures. The wound  was then injected 10 cc of plain half percent Marcaine.   Sterile dressing was applied. Tourniquet was  released. Fingers looked good.  The patient was taken recovery in stable condition. Discharged on Lorcet  Plus, 2 refills, 60 tablets, take 1 every 4 hours p.r.n. for pain. Follow-up  on Monday       SEH/MEDQ  D:  09/02/2004  T:  09/02/2004  Job:  [id number]

## 2010-08-01 ENCOUNTER — Other Ambulatory Visit: Payer: Self-pay | Admitting: Family Medicine

## 2010-08-02 ENCOUNTER — Telehealth: Payer: Self-pay | Admitting: Family Medicine

## 2010-08-03 ENCOUNTER — Telehealth: Payer: Self-pay | Admitting: Family Medicine

## 2010-08-03 NOTE — Telephone Encounter (Signed)
Those were signed off and left in the front, pls let me know if  missing

## 2010-08-03 NOTE — Telephone Encounter (Signed)
noted 

## 2010-08-05 ENCOUNTER — Telehealth: Payer: Self-pay | Admitting: Family Medicine

## 2010-08-05 ENCOUNTER — Other Ambulatory Visit: Payer: Self-pay | Admitting: Family Medicine

## 2010-08-05 DIAGNOSIS — E785 Hyperlipidemia, unspecified: Secondary | ICD-10-CM

## 2010-08-05 DIAGNOSIS — I10 Essential (primary) hypertension: Secondary | ICD-10-CM

## 2010-08-05 DIAGNOSIS — E119 Type 2 diabetes mellitus without complications: Secondary | ICD-10-CM

## 2010-08-05 NOTE — Telephone Encounter (Signed)
Patient called back again today and states that the doctor needs her to come off her plavix before she can have surgery.  Please advise.

## 2010-08-05 NOTE — Telephone Encounter (Signed)
Called patient, no answer 

## 2010-08-05 NOTE — Telephone Encounter (Signed)
pls contact [name] let her know I am setting up an appt for cardiology to see her top clear her for back surgery, Dr Joseph Art needs clearance. Pls refer her to her cardiologist, I think it may be [place] , just verify, so she can get this done pls, I am entering order per routine

## 2010-08-09 ENCOUNTER — Telehealth: Payer: Self-pay | Admitting: Family Medicine

## 2010-08-09 NOTE — Telephone Encounter (Signed)
This pt needs an appt sched for a general exam for medical clearance for back surgery , psl sched , and let her know I will address all questions at that visit

## 2010-08-09 NOTE — Telephone Encounter (Signed)
States she left surgical clearance papers here last week, also needs to stop lasix.

## 2010-08-10 DIAGNOSIS — F329 Major depressive disorder, single episode, unspecified: Secondary | ICD-10-CM

## 2010-08-10 DIAGNOSIS — I10 Essential (primary) hypertension: Secondary | ICD-10-CM

## 2010-08-10 DIAGNOSIS — M549 Dorsalgia, unspecified: Secondary | ICD-10-CM

## 2010-08-10 DIAGNOSIS — E119 Type 2 diabetes mellitus without complications: Secondary | ICD-10-CM

## 2010-08-10 NOTE — Telephone Encounter (Signed)
Appt. 6.11.12

## 2010-08-11 NOTE — Telephone Encounter (Signed)
Called patient and no answer.

## 2010-08-18 ENCOUNTER — Encounter: Payer: Self-pay | Admitting: Family Medicine

## 2010-08-22 ENCOUNTER — Encounter: Payer: Self-pay | Admitting: Family Medicine

## 2010-08-22 ENCOUNTER — Ambulatory Visit (INDEPENDENT_AMBULATORY_CARE_PROVIDER_SITE_OTHER): Payer: Medicare Other | Admitting: Family Medicine

## 2010-08-22 ENCOUNTER — Other Ambulatory Visit: Payer: Self-pay | Admitting: Family Medicine

## 2010-08-22 VITALS — BP 120/76 | HR 82 | Resp 16 | Ht 62.0 in | Wt 165.1 lb

## 2010-08-22 DIAGNOSIS — Z01818 Encounter for other preprocedural examination: Secondary | ICD-10-CM

## 2010-08-22 DIAGNOSIS — D509 Iron deficiency anemia, unspecified: Secondary | ICD-10-CM

## 2010-08-22 DIAGNOSIS — D649 Anemia, unspecified: Secondary | ICD-10-CM

## 2010-08-22 DIAGNOSIS — E119 Type 2 diabetes mellitus without complications: Secondary | ICD-10-CM

## 2010-08-22 DIAGNOSIS — Z23 Encounter for immunization: Secondary | ICD-10-CM

## 2010-08-22 DIAGNOSIS — E785 Hyperlipidemia, unspecified: Secondary | ICD-10-CM

## 2010-08-22 DIAGNOSIS — I10 Essential (primary) hypertension: Secondary | ICD-10-CM

## 2010-08-22 DIAGNOSIS — M549 Dorsalgia, unspecified: Secondary | ICD-10-CM

## 2010-08-22 LAB — BASIC METABOLIC PANEL
BUN: 17 mg/dL (ref 6–23)
Chloride: 103 mEq/L (ref 96–112)
Potassium: 4.4 mEq/L (ref 3.5–5.3)
Sodium: 141 mEq/L (ref 135–145)

## 2010-08-22 LAB — POCT URINALYSIS DIPSTICK
Bilirubin, UA: NEGATIVE
Glucose, UA: NEGATIVE
Ketones, ur: NEGATIVE
Leukocytes, UA: NEGATIVE
Spec Grav, UA: 1.02

## 2010-08-22 LAB — CBC WITH DIFFERENTIAL/PLATELET
Basophils Absolute: 0 10*3/uL (ref 0.0–0.1)
Basophils Relative: 0 % (ref 0–1)
Eosinophils Absolute: 0.2 10*3/uL (ref 0.0–0.7)
Eosinophils Relative: 2 % (ref 0–5)
MCH: 21.6 pg — ABNORMAL LOW (ref 26.0–34.0)
MCHC: 30.2 g/dL (ref 30.0–36.0)
MCV: 71.8 fL — ABNORMAL LOW (ref 78.0–100.0)
Platelets: 218 10*3/uL (ref 150–400)
RDW: 15.2 % (ref 11.5–15.5)
WBC: 6.2 10*3/uL (ref 4.0–10.5)

## 2010-08-22 NOTE — Progress Notes (Signed)
  Subjective:    Patient ID: [name], female    DOB: Oct 02, 1950, 60 y.o.   MRN: [id number]  HPI Pt in for pre op exam for back surgery. Chronic  Dry cough intermittent x 2 months. No fever or chill. Loose stools yesterday but has diareah predominant IBS, no problems today. Feels well overall, and is anxious to have the back surgery and expects improvement in the quality of her life with it.   Review of Systems Denies recent fever or chills. Denies sinus pressure, nasal congestion, ear pain or sore throat.Improved sinus symptoms since surgery. Denies chest congestion, productive cough or wheezing. Denies chest pains, palpitations, paroxysmal nocturnal dyspnea, orthopnea and leg swelling Denies constipation.  Denies rectal bleeding or change in bowel movement.Succesful esophageal dilatation in the past 4 to 6 weeks. Chronic abdominal pain , IBS diarreah predominant, also chronic nausea, overall symptoms are stable Denies dysuria, frequency, hesitancy or incontinence. Chronic uncontrolled back pain, with recurrent falls, has upcoming surgery planned, has had therapy and epidurals with little to no success. Denies headaches, seizure, numbness, or tingling. Denies uncontrolled depression or  Anxiety,  chronic insomnia has improved Denies skin break down or rash. Tests sugars 2 to 3 times daily, fluctuates a lot still, fasting 160 to 200, evening sugars , evening sugars ar 14 to 180's . It has been as low as in the 40's       Objective:   Physical Exam Patient alert and oriented and in no Cardiopulmonary distress.  HEENT: No facial asymmetry, EOMI, no sinus tenderness, TM's clear, Oropharynx pink and moist.  Neck supple no adenopathy.  Chest: Clear to auscultation bilaterally.  CVS: S1, S2 no murmurs, no S3.  ABD: Soft non tender. Bowel sounds normal.  Ext: No edema  MS: Decreased ROM spine, shoulders, hips and knees.  Skin: Intact, no ulcerations or rash noted.  Psych:  Good eye contact, normal affect. Memory intact not anxious or depressed appearing.  CNS: CN 2-12 intact, power,and tone normal throughout.         Assessment & Plan:

## 2010-08-22 NOTE — Patient Instructions (Addendum)
F/u in 3.5 months.  All the best with your surgery, once I hear from cardiology that you are cleared by them I will notify your surgeon.  cXR today, hBA1C, chem 7, cbc .CCUA, will call with any med changes  It is safe for you to stop plavix before surgery , I will let the surgeon know Your blood sugars appear to be uncontrolled, itis likely you will need to increase the dose of lantus , I will let you know.  Pneumonia vaccine today.  I will send a copy of the calf ultrasound to the cardiologist for their opinion as to whethwer or not any further consultation is needed, I do not think it is.

## 2010-08-22 NOTE — Telephone Encounter (Signed)
Here for ov

## 2010-08-23 ENCOUNTER — Other Ambulatory Visit: Payer: Self-pay | Admitting: Family Medicine

## 2010-08-23 ENCOUNTER — Ambulatory Visit (HOSPITAL_COMMUNITY)
Admission: RE | Admit: 2010-08-23 | Discharge: 2010-08-23 | Disposition: A | Discharge status: Home / Self Care | Payer: Medicare Other | Source: Ambulatory Visit | Attending: Family Medicine | Admitting: Family Medicine

## 2010-08-23 DIAGNOSIS — Z01818 Encounter for other preprocedural examination: Secondary | ICD-10-CM | POA: Insufficient documentation

## 2010-08-23 LAB — ANEMIA PANEL
ABS Retic: 81.1 10*3/uL (ref 19.0–186.0)
Iron: 77 ug/dL (ref 42–145)
RBC.: 4.77 MIL/uL (ref 3.87–5.11)
Retic Ct Pct: 1.7 % (ref 0.4–3.1)
UIBC: 271 ug/dL
Vitamin B-12: 521 pg/mL (ref 211–911)

## 2010-08-24 ENCOUNTER — Telehealth: Payer: Self-pay | Admitting: Family Medicine

## 2010-08-26 DIAGNOSIS — Z01818 Encounter for other preprocedural examination: Secondary | ICD-10-CM | POA: Insufficient documentation

## 2010-08-26 MED ORDER — INSULIN GLARGINE 100 UNIT/ML ~~LOC~~ SOLN: 15.0000 [IU] | Freq: Every day | SUBCUTANEOUS | Status: DC

## 2010-08-26 NOTE — Assessment & Plan Note (Signed)
Controlled, no change in medication  

## 2010-08-26 NOTE — Assessment & Plan Note (Signed)
Improved blood sugar , but still uncontrolled, lants to be increased to 15 units daily, pt notified

## 2010-08-26 NOTE — Telephone Encounter (Signed)
She needs to call GI for her nausea med, ok to give her a copy of her lab if she wishes

## 2010-08-26 NOTE — Assessment & Plan Note (Signed)
Spoke with pt , states cannot take iron since it makes her constipated

## 2010-08-26 NOTE — Assessment & Plan Note (Signed)
rept lab needed to determine status, esp i light of upcoming surgery

## 2010-08-26 NOTE — Assessment & Plan Note (Signed)
Normal exam, will wait on lab data and final clearance by cardiology, pt aware

## 2010-08-26 NOTE — Assessment & Plan Note (Signed)
Low fat diet encouraged,and  Discussed, no med change

## 2010-08-26 NOTE — Telephone Encounter (Signed)
Called patient,busy signal.

## 2010-08-26 NOTE — Assessment & Plan Note (Signed)
Unchanged, surgery upcoming once cleared

## 2010-08-29 ENCOUNTER — Telehealth: Payer: Self-pay | Admitting: Family Medicine

## 2010-08-29 NOTE — Telephone Encounter (Signed)
She is aware 

## 2010-08-29 NOTE — Telephone Encounter (Signed)
Patient aware.

## 2010-09-05 ENCOUNTER — Telehealth: Payer: Self-pay | Admitting: Family Medicine

## 2010-09-05 NOTE — Telephone Encounter (Signed)
pls advise her that blood clots that can go to her lungs cannot be felt as she describes, "knots under the skin"

## 2010-09-05 NOTE — Telephone Encounter (Signed)
She said its not hurting or anything and she was bathing and just felt it.

## 2010-09-05 NOTE — Telephone Encounter (Signed)
Patient states she noticed three hard knots on her left thigh near knee, found while bathing. Concerned about clots.

## 2010-09-06 ENCOUNTER — Telehealth: Payer: Self-pay | Admitting: Family Medicine

## 2010-09-06 NOTE — Telephone Encounter (Signed)
[name] told her to go to urgent care and she went and they made her appointment for tomorrow

## 2010-09-06 NOTE — Telephone Encounter (Signed)
pls see if pt can be worked in this week, nOT next week , if so give her an appt pls

## 2010-09-06 NOTE — Telephone Encounter (Signed)
Left thigh area, hard knot, and feels warm. Wants ultrasound. I advised her that we couldn't do that without seeing her and she may have to go to the urgent care if she was concerned but she wants you to order an u/s and to come in and let you just look at it. What do I need to tell her about this again?

## 2010-09-07 ENCOUNTER — Telehealth: Payer: Self-pay | Admitting: Family Medicine

## 2010-09-07 NOTE — Telephone Encounter (Signed)
Patient is just advising she will be seeing a surgeon for cyst

## 2010-09-11 HISTORY — PX: BACK SURGERY: SHX140

## 2010-09-13 ENCOUNTER — Telehealth: Payer: Self-pay | Admitting: Family Medicine

## 2010-09-13 NOTE — Telephone Encounter (Signed)
noted 

## 2010-09-15 ENCOUNTER — Telehealth: Payer: Self-pay | Admitting: Family Medicine

## 2010-09-15 NOTE — Telephone Encounter (Signed)
No i do not.

## 2010-09-15 NOTE — Telephone Encounter (Signed)
PATIENT AWARE AND STATES SHE HAS FOUND SOMEONE

## 2010-09-23 ENCOUNTER — Telehealth: Payer: Self-pay | Admitting: Family Medicine

## 2010-09-26 NOTE — Telephone Encounter (Signed)
pls tell her I am aware and that I hope all goes well, any questions about the surgery are for the surgeon, any other document pls I will respond

## 2010-09-27 ENCOUNTER — Telehealth: Payer: Self-pay | Admitting: Family Medicine

## 2010-09-27 NOTE — Telephone Encounter (Signed)
Called patient, left message.

## 2010-09-28 ENCOUNTER — Ambulatory Visit (HOSPITAL_COMMUNITY)
Admission: RE | Admit: 2010-09-28 | Discharge: 2010-09-28 | Disposition: A | Discharge status: Home / Self Care | Payer: Medicare Other | Source: Ambulatory Visit | Attending: Orthopedic Surgery | Admitting: Orthopedic Surgery

## 2010-09-28 ENCOUNTER — Encounter (HOSPITAL_COMMUNITY)
Admission: RE | Admit: 2010-09-28 | Discharge: 2010-09-28 | Disposition: A | Discharge status: Home / Self Care | Payer: Medicare Other | Source: Ambulatory Visit | Attending: Orthopedic Surgery | Admitting: Orthopedic Surgery

## 2010-09-28 ENCOUNTER — Other Ambulatory Visit (HOSPITAL_COMMUNITY): Payer: Self-pay | Admitting: Orthopedic Surgery

## 2010-09-28 DIAGNOSIS — Z01818 Encounter for other preprocedural examination: Secondary | ICD-10-CM | POA: Insufficient documentation

## 2010-09-28 DIAGNOSIS — Z01812 Encounter for preprocedural laboratory examination: Secondary | ICD-10-CM | POA: Insufficient documentation

## 2010-09-28 DIAGNOSIS — M549 Dorsalgia, unspecified: Secondary | ICD-10-CM | POA: Insufficient documentation

## 2010-09-28 LAB — BASIC METABOLIC PANEL
Calcium: 9.8 mg/dL (ref 8.4–10.5)
GFR calc Af Amer: >60 mL/min (ref >60)
GFR calc non Af Amer: >60 mL/min (ref >60)
Potassium: 4 mEq/L (ref 3.5–5.1)
Sodium: 140 mEq/L (ref 135–145)

## 2010-09-28 LAB — CBC
MCH: 22.2 pg — ABNORMAL LOW (ref 26.0–34.0)
MCHC: 31.2 g/dL (ref 30.0–36.0)
Platelets: 210 10*3/uL (ref 150–400)

## 2010-09-28 LAB — SURGICAL PCR SCREEN
MRSA, PCR: NEGATIVE
Staphylococcus aureus: NEGATIVE

## 2010-09-28 LAB — GLUCOSE, CAPILLARY: Glucose-Capillary: 117 mg/dL — ABNORMAL HIGH (ref 70–99)

## 2010-09-28 NOTE — Telephone Encounter (Signed)
Patient aware we have a meter for her

## 2010-09-28 NOTE — Telephone Encounter (Signed)
Patient aware.

## 2010-09-29 ENCOUNTER — Inpatient Hospital Stay (HOSPITAL_COMMUNITY)
Admission: RE | Admit: 2010-09-29 | Discharge: 2010-10-04 | DRG: 490 | Disposition: A | Discharge status: Home Health | Payer: Medicare Other | Source: Ambulatory Visit | Attending: Orthopedic Surgery | Admitting: Orthopedic Surgery

## 2010-09-29 ENCOUNTER — Ambulatory Visit (HOSPITAL_COMMUNITY): Payer: Medicare Other

## 2010-09-29 DIAGNOSIS — Z8673 Personal history of transient ischemic attack (TIA), and cerebral infarction without residual deficits: Secondary | ICD-10-CM

## 2010-09-29 DIAGNOSIS — I2510 Atherosclerotic heart disease of native coronary artery without angina pectoris: Secondary | ICD-10-CM | POA: Diagnosis present

## 2010-09-29 DIAGNOSIS — E119 Type 2 diabetes mellitus without complications: Secondary | ICD-10-CM | POA: Diagnosis present

## 2010-09-29 DIAGNOSIS — D62 Acute posthemorrhagic anemia: Secondary | ICD-10-CM | POA: Diagnosis not present

## 2010-09-29 DIAGNOSIS — E669 Obesity, unspecified: Secondary | ICD-10-CM | POA: Diagnosis present

## 2010-09-29 DIAGNOSIS — G4733 Obstructive sleep apnea (adult) (pediatric): Secondary | ICD-10-CM | POA: Diagnosis present

## 2010-09-29 DIAGNOSIS — M48061 Spinal stenosis, lumbar region without neurogenic claudication: Principal | ICD-10-CM | POA: Diagnosis present

## 2010-09-29 DIAGNOSIS — I10 Essential (primary) hypertension: Secondary | ICD-10-CM | POA: Diagnosis present

## 2010-09-29 DIAGNOSIS — K5900 Constipation, unspecified: Secondary | ICD-10-CM | POA: Diagnosis not present

## 2010-09-29 HISTORY — PX: SPINE SURGERY: SHX786

## 2010-09-29 LAB — GLUCOSE, CAPILLARY
Glucose-Capillary: 142 mg/dL — ABNORMAL HIGH (ref 70–99)
Glucose-Capillary: 158 mg/dL — ABNORMAL HIGH (ref 70–99)
Glucose-Capillary: 216 mg/dL — ABNORMAL HIGH (ref 70–99)

## 2010-09-30 ENCOUNTER — Telehealth: Payer: Self-pay | Admitting: Family Medicine

## 2010-09-30 LAB — GLUCOSE, CAPILLARY
Glucose-Capillary: 198 mg/dL — ABNORMAL HIGH (ref 70–99)
Glucose-Capillary: 203 mg/dL — ABNORMAL HIGH (ref 70–99)
Glucose-Capillary: 97 mg/dL (ref 70–99)

## 2010-10-01 LAB — GLUCOSE, CAPILLARY
Glucose-Capillary: 90 mg/dL (ref 70–99)
Glucose-Capillary: 62 mg/dL — ABNORMAL LOW (ref 70–99)
Glucose-Capillary: 78 mg/dL (ref 70–99)

## 2010-10-02 LAB — GLUCOSE, CAPILLARY: Glucose-Capillary: 71 mg/dL (ref 70–99)

## 2010-10-02 LAB — CBC
MCV: 70.1 fL — ABNORMAL LOW (ref 78.0–100.0)
Platelets: 164 10*3/uL (ref 150–400)
RDW: 14.9 % (ref 11.5–15.5)
WBC: 6.6 10*3/uL (ref 4.0–10.5)

## 2010-10-02 NOTE — Telephone Encounter (Signed)
Spoke with pt she has had 1 unit of PRBC post op and will require an additional 2 reportedly, states she is doing well overall

## 2010-10-03 LAB — BASIC METABOLIC PANEL
Potassium: 3.5 mEq/L (ref 3.5–5.1)
Sodium: 140 mEq/L (ref 135–145)

## 2010-10-03 LAB — CROSSMATCH: ABO/RH(D): O POS

## 2010-10-03 LAB — CBC
Hemoglobin: 9.6 g/dL — ABNORMAL LOW (ref 12.0–15.0)
MCHC: 31.7 g/dL (ref 30.0–36.0)
RDW: 16.6 % — ABNORMAL HIGH (ref 11.5–15.5)
WBC: 7 10*3/uL (ref 4.0–10.5)

## 2010-10-03 LAB — GLUCOSE, CAPILLARY: Glucose-Capillary: 108 mg/dL — ABNORMAL HIGH (ref 70–99)

## 2010-10-04 LAB — CBC
HCT: 29.5 % — ABNORMAL LOW (ref 36.0–46.0)
Hemoglobin: 9.3 g/dL — ABNORMAL LOW (ref 12.0–15.0)
MCH: 23.7 pg — ABNORMAL LOW (ref 26.0–34.0)
MCHC: 31.5 g/dL (ref 30.0–36.0)
MCV: 75.3 fL — ABNORMAL LOW (ref 78.0–100.0)

## 2010-10-04 LAB — GLUCOSE, CAPILLARY

## 2010-10-05 IMAGING — CR DG CHEST 2V
2 series · 2 of 2 positions shown · non-contrast
Comparison: 07/14/2008.  CT scan from 07/15/2008.

CLINICAL DATA: Shortness of breath and wheezing.

CHEST - 2 VIEW

[view not recorded (1 of 2)]
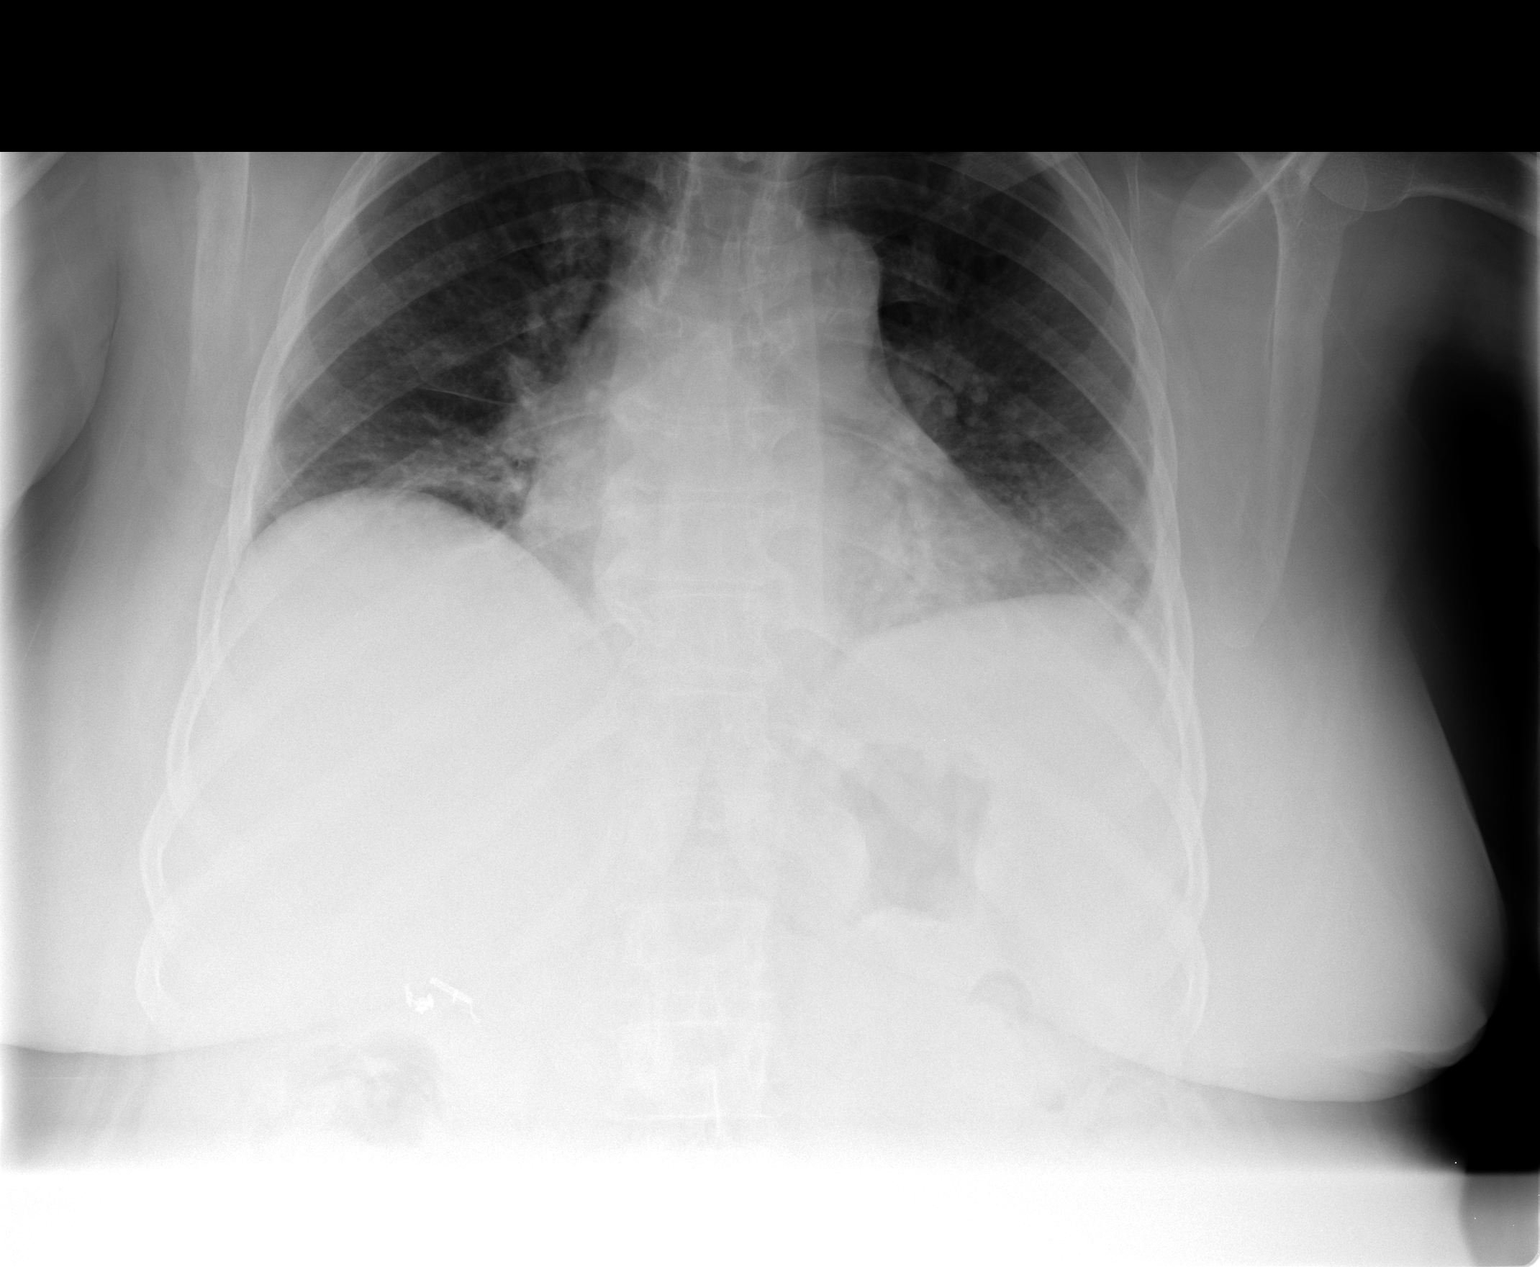

[view not recorded (2 of 2)]
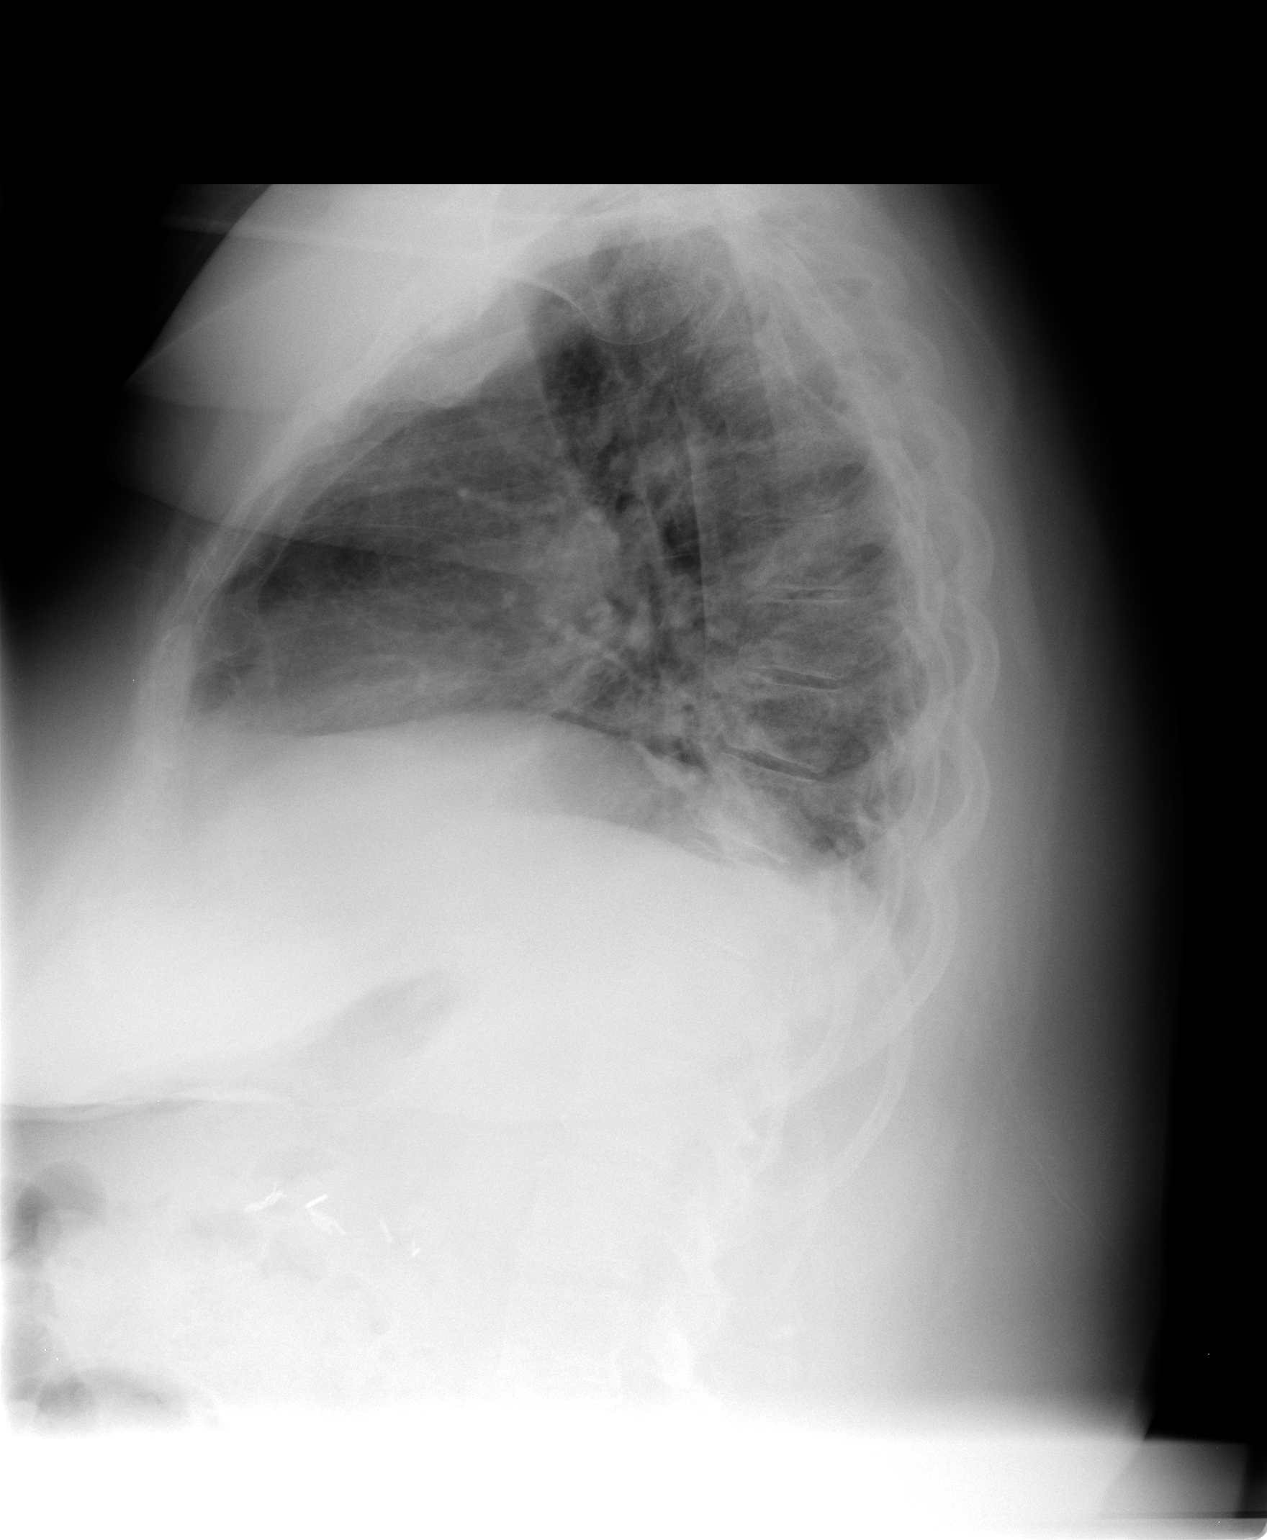

[2 of 2 positions shown; findings below may reference images not displayed]

FINDINGS: Lung volumes are low.  There is bibasilar atelectasis.
Mild vascular congestion without airspace pulmonary edema.  No
evidence for focal pneumonia.  No substantial pleural effusion.
Imaged bony structures of the thorax are intact.
IMPRESSION: Low volume film with bibasilar atelectasis.

## 2010-10-08 IMAGING — RF DG ESOPHAGUS
11 of 18 series · 13 of 24 positions shown · non-contrast
Comparison: None.

CLINICAL DATA: Dysphagia with solids.

ESOPHOGRAM/BARIUM SWALLOW
TECHNIQUE: Single contrast examination was performed using thin
barium.
Fluoroscopy time:  2.0 minutes.

[Series 1: run · 2 of 7 slices shown (1 of 11)]
[im 1/7]
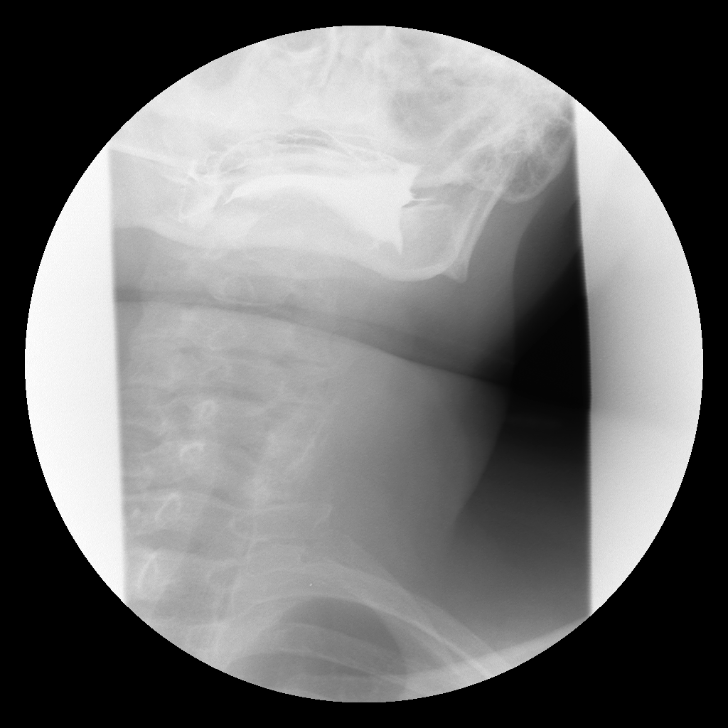
[im 5/7]
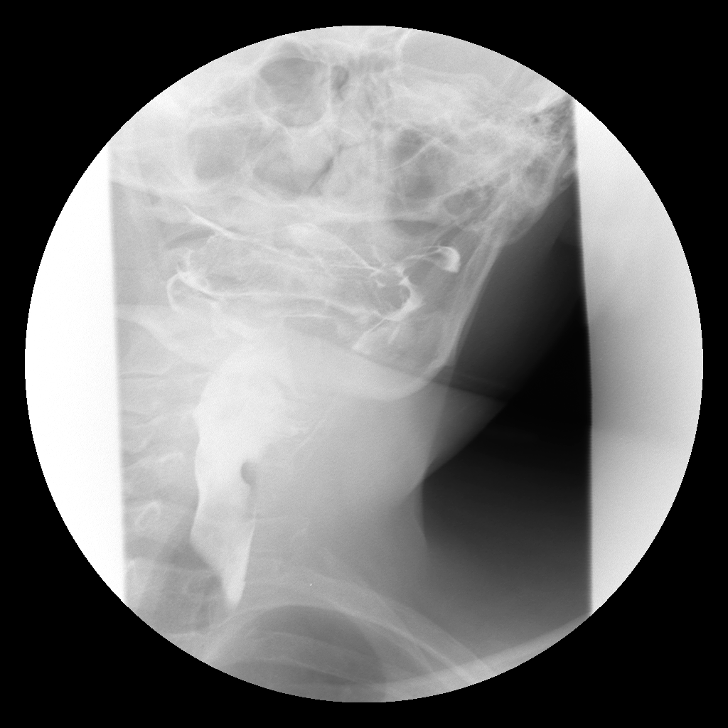

[Series 2: run · 1 of 1 slices shown (2 of 11)]
[im 1/1]
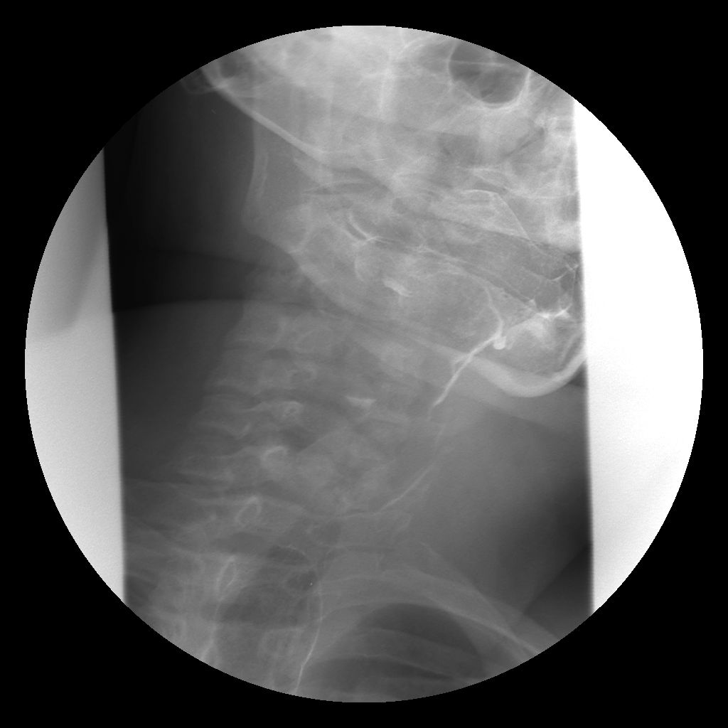

[Series 3: run · 2 of 7 slices shown (3 of 11)]
[im 3/7]
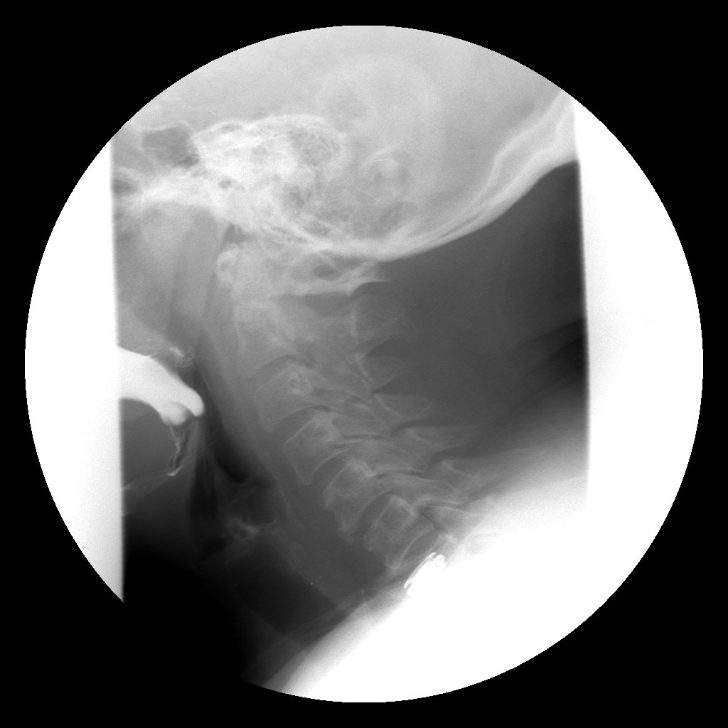
[im 7/7]
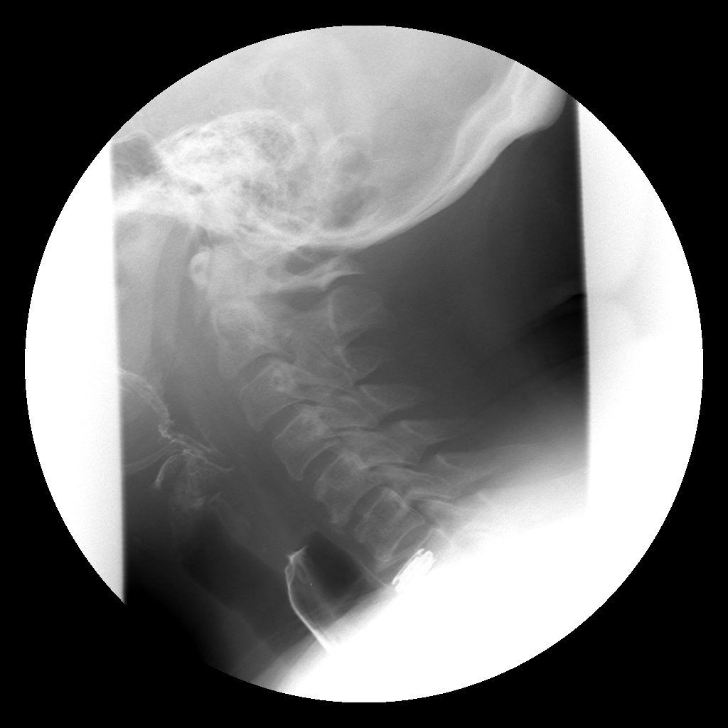

[Series 5: run · 1 of 1 slices shown (4 of 11)]
[im 1/1]
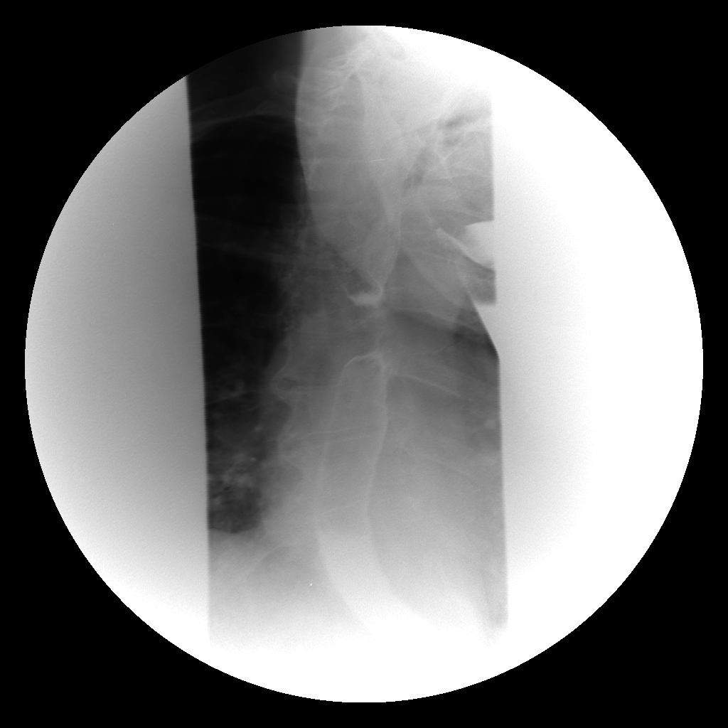

[Series 7: run · 1 of 1 slices shown (5 of 11)]
[im 1/1]
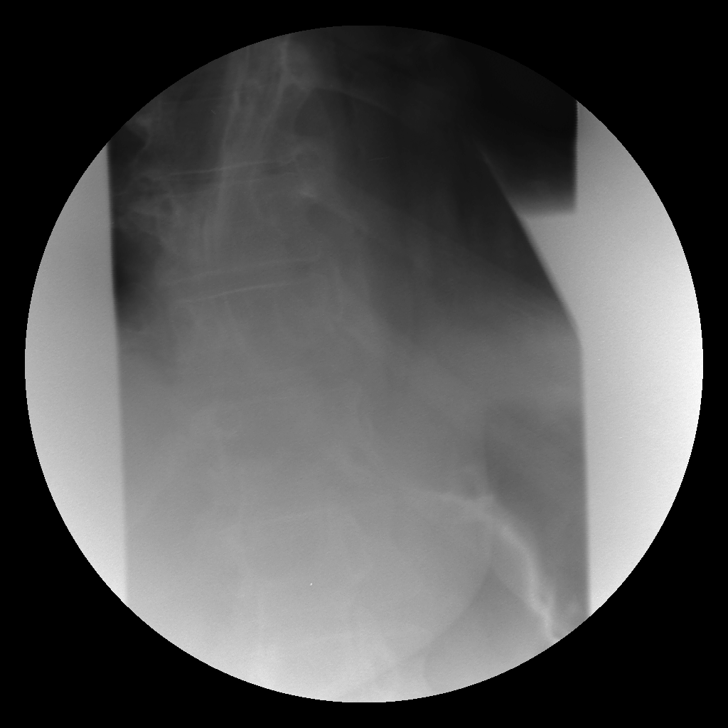

[Series 8: run · 1 of 1 slices shown (6 of 11)]
[im 1/1]
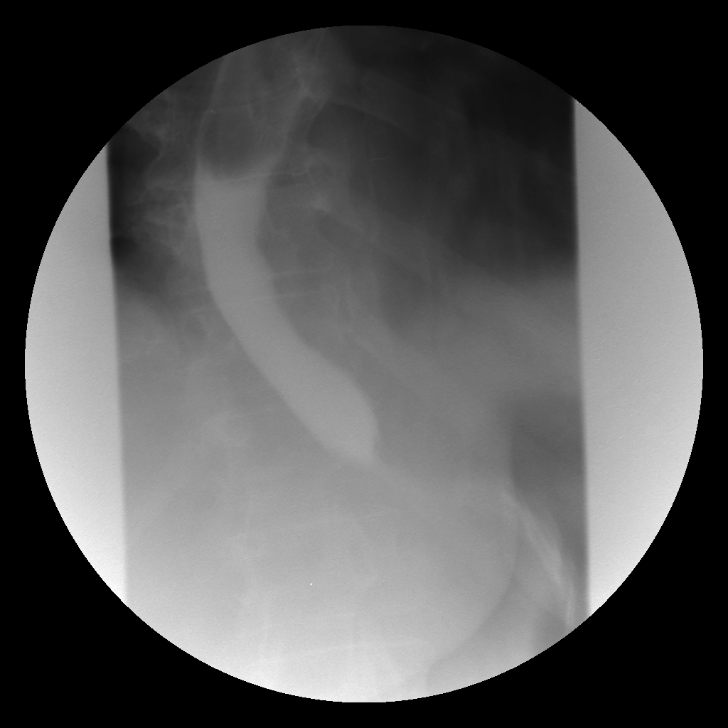

[Series 10: run · 1 of 1 slices shown (7 of 11)]
[im 1/1]
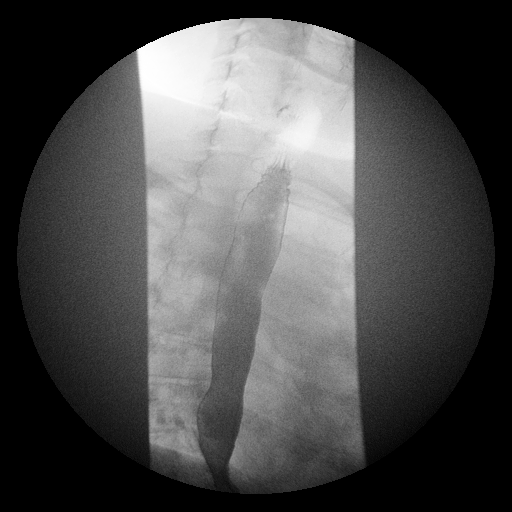

[Series 12: run · 1 of 1 slices shown (8 of 11)]
[im 1/1]
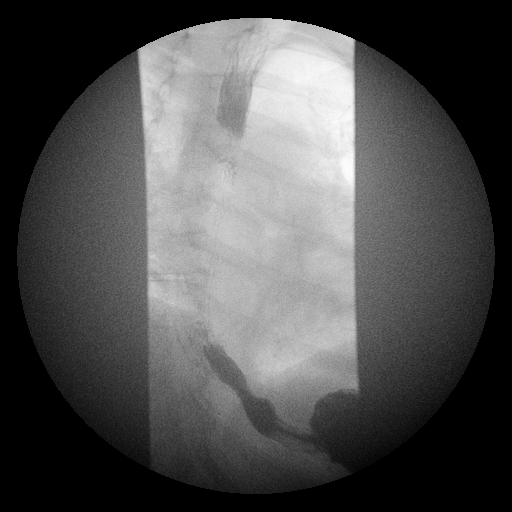

[Series 14: run · 1 of 1 slices shown (9 of 11)]
[im 1/1]
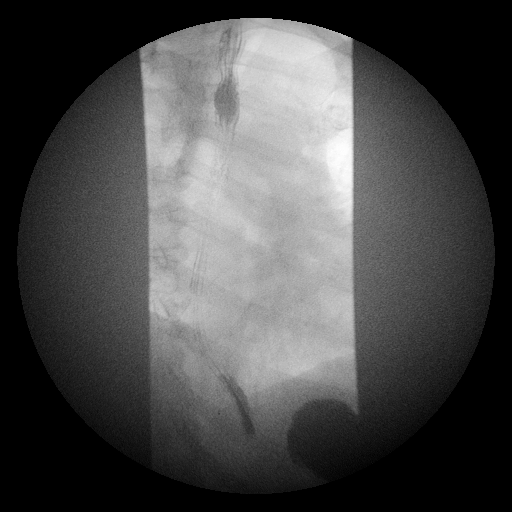

[Series 16: run · 1 of 1 slices shown (10 of 11)]
[im 1/1]
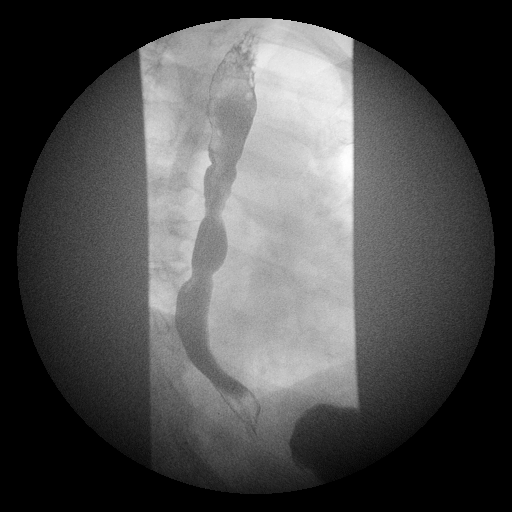

[Series 18: run · 1 of 1 slices shown (11 of 11)]
[im 1/1]
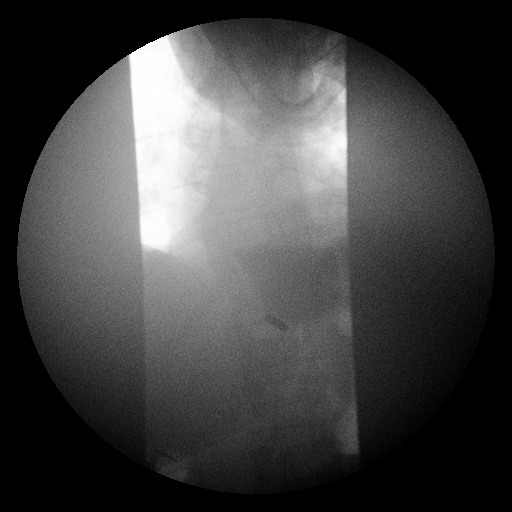

[13 of 24 positions shown; findings below may reference images not displayed]

FINDINGS: Study was technically difficult due to patient
condition.  There is great difficulty in initiating swallowing.
Pre swallow spillover to the vallecula is seen.  No definite
aspiration.

Primary peristalsis is quite sluggish.  No definite esophageal fold
thickening.  Although there was no high-grade stricture at the
gastroesophageal junction, a 13 mm barium pill failed to pass into
the stomach, and remained lodged in the distal esophagus.
IMPRESSION: 1.  A 13 mm barium pill failed to pass beyond the distal esophagus.
Please see discussion above.
2.  Esophageal dysmotility.
3.  Difficulty initiating swallowing, with pre swallow spillover to
the vallecula.  Dedicated swallowing study is recommended.

## 2010-10-13 ENCOUNTER — Telehealth: Payer: Self-pay | Admitting: Family Medicine

## 2010-10-13 NOTE — Op Note (Signed)
NAME:  [name], [name]                  ACCOUNT NO.:  [id number]  MEDICAL RECORD NO.:  [id number]  LOCATION:  5011                         FACILITY:  [facility]  PHYSICIAN:  Alvy Beal, MD    DATE OF BIRTH:  23-Oct-1950  DATE OF PROCEDURE:  09/29/2010 DATE OF DISCHARGE:                              OPERATIVE REPORT   PREOPERATIVE DIAGNOSIS:  Lumbar spinal stenosis, L3-S1.  POSTOPERATIVE DIAGNOSIS:  Lumbar spinal stenosis, L3-S1.  OPERATIVE PROCEDURE:  L1-S1 lumbar decompression.  COMPLICATIONS:  None.  BLOOD LOSS:  300 mL, 125 mL returned in Cell Sa[facility].  COMPLICATIONS:  None.  FIRST ASSISTANT:  Norval Gable, [place].  HISTORY:  This is a very pleasant 60 year old, who has been having progressive debilitating back, buttock, and bilateral leg pain.  Because of an increased symptomatology and the failure to improve with conservative measures, we elected to proceed with surgery.  All appropriate risks, benefits, and alternatives of surgery were discussed. Consent was obtained.  OPERATIVE NOTE:  The patient was brought to the operating room, placed supine on the operating table.  After successful induction of general anesthesia, endotracheal intubation TED, SCDs, and Foley were inserted, and the patient was turned prone onto the Wilson frame.  All bony prominences were well padded.  The back was prepped and draped in standard fashion.  Appropriate time-out was done to confirm patient, procedure, and affected extremity.  Once this was done, a midline lumbar incision was made starting at the superior aspect of L3 and proceeding to the inferior aspect of S1.  Sharp dissection was carried out down to the deep fascia.  Deep fascia was sharply incised and using a Cobb elevator, I exposed the L3, L4, L5 spinous processes, lamina, and facet capsules.  Care was taken not to violate the facet capsules itself. Once I had the posterior aspect visualized, I placed a Penfield 4 underneath the L5  lamina.  I confirmed that I was at the appropriate level with x-ray and then proceeded with the decompression.  Using double-action Leksell rongeur, I removed the spinous process of L5 and L4 and a majority of that of L3.  I then used a microcurette to develop a plane between the ligamentum flavum and the lamina of L5.  I then used a 3- and 2 mm Kerrison punch to perform a complete laminectomy of L5.  I then developed a plane underneath the ligamentum flavum and used a 2- and 3-mm Kerrison to remove the ligamentum flavum to complete my central decompression.  I then continued my central decompression by performing an L4 laminectomy and an L3 laminotomy.  Once I had the central decompression complete, I then worked into the lateral recess.  Using a Penfield 4, I dissected the plane between ligamentum flavum and underlying thecal sac.  I then used a 2- and 3 mm Kerrison punch to remove the thickened buccal ligamentum flavum and osteophyte from the facet complex.  I was able to visualize and palpate the medial aspect of the L3 pedicle and completely decompress the lateral recess.  I then proceeded into the L3 neural foramen with my 2-mm Kerrison and performed a foraminotomy.  I then continued  down to L4, repeating the lateral recess decompression out to the medial border of the pedicle and then the foraminotomy.  I did this at L5 and down to S1.  Once I had decompressed completely, I then switched sides and repeated this procedure on the contralateral side.  At this point, I could easily take my [facility], passed it superiorly and inferiorly below S1 and the remainder of L3 and out the L3, L4, L5, and S1 neural foramen.  I had adequate central and lateral recess as well as foraminal decompression.  I irrigated copiously with normal saline, used bipolar electrocautery to obtain hemostasis, and then placed a thrombin-soaked Gelfoam patty over the exposed thecal sac.  I then closed in a  layered fashion with interrupted #1 Vicryl sutures, a running 0 Vicryl suture, interrupted 2-0 Vicryl sutures, and 3-0 Monocryl.  It should be noted that I did place a drain and closed the fascia over that drain.  The drain was brought out through a separate stab incision and was sutured to the skin.  Once the wound was clean, Steri-Strips and dry dressing were applied.  The patient was extubated and transferred to PACU without incident.  At the end of the case, all needle and sponge counts were correct.  There was no adverse intraoperative events.     Alvy Beal, MD     DDB/MEDQ  D:  09/29/2010  T:  09/29/2010  Job:  [id number]  Electronically Signed by Venita Lick MD on 10/13/2010 11:29:39 AM

## 2010-10-13 NOTE — Telephone Encounter (Signed)
error 

## 2010-10-18 ENCOUNTER — Telehealth: Payer: Self-pay | Admitting: Family Medicine

## 2010-10-19 ENCOUNTER — Telehealth: Payer: Self-pay | Admitting: *Deleted

## 2010-10-19 NOTE — Telephone Encounter (Signed)
No medication change, can arrange for nurse visit to check BP in office when one of the docs is in the facility

## 2010-10-19 NOTE — Telephone Encounter (Signed)
Opened in error

## 2010-10-19 NOTE — Telephone Encounter (Signed)
Home health nurse called and states blood pressure today is 164/82

## 2010-10-20 NOTE — Telephone Encounter (Signed)
Please schedule nurse bp check when one of the docs are here

## 2010-10-21 ENCOUNTER — Other Ambulatory Visit: Payer: Self-pay | Admitting: Family Medicine

## 2010-10-21 ENCOUNTER — Telehealth: Payer: Self-pay | Admitting: Family Medicine

## 2010-10-21 DIAGNOSIS — E109 Type 1 diabetes mellitus without complications: Secondary | ICD-10-CM

## 2010-10-21 MED ORDER — ACCU-CHEK MULTICLIX LANCETS MISC: Status: DC

## 2010-10-21 MED ORDER — GLUCOSE BLOOD VI STRP: ORAL_STRIP | Status: DC

## 2010-10-21 NOTE — Telephone Encounter (Signed)
Sent as requested.

## 2010-10-24 ENCOUNTER — Other Ambulatory Visit: Payer: Self-pay | Admitting: Family Medicine

## 2010-10-25 ENCOUNTER — Other Ambulatory Visit: Payer: Self-pay | Admitting: Family Medicine

## 2010-10-30 IMAGING — RF DG SWALLOWING FUNCTION
1 series · 1 of 1 positions shown · non-contrast
Comparison: None

CLINICAL DATA: Oral pharyngeal difficulty

SWALLOWING FUNCTION STUDY:
TECHNIQUE: Patient swallowed varying consistencies of barium with
the assistance of a speech pathologist.  I performed real-time
fluoroscopic assessment in the lateral projection.
Fluoroscopy time:  1.2 minutes

[Series 1: run · 1 of 1 slices shown]
[im 1/1]
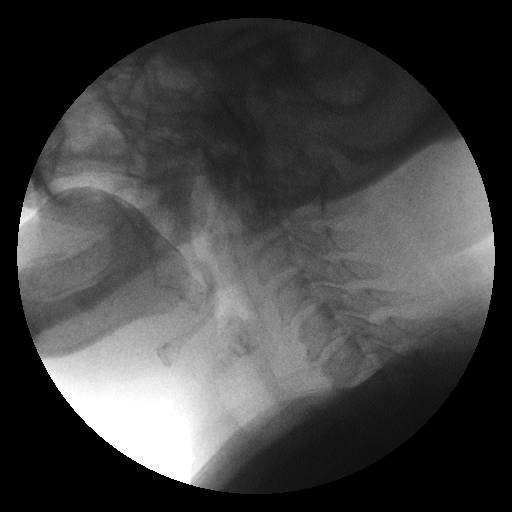

[1 of 1 positions shown; findings below may reference images not displayed]

FINDINGS: With applesauce consistency, slightly delayed oral phase of
swallowing is identified.  Once swallowing is initiated, no
laryngeal penetration or aspiration seen.  No residuals noted.
With gram cracker consistency, similar delay in oral phase is
noted.  No laryngeal penetration or aspiration.

With thin barium by medicine cup, spillover contrast to the
vallecula and piriform sinuses is noted.  No laryngeal penetration
or aspiration.  No significant residuals.  Sequential swallows of
thin barium by straw demonstrates minimal delayed initiation
without laryngeal penetration or aspiration.
IMPRESSION: Minimal oral and swallowing dysfunction as above.

## 2010-11-01 ENCOUNTER — Other Ambulatory Visit: Payer: Self-pay | Admitting: Family Medicine

## 2010-11-01 ENCOUNTER — Telehealth: Payer: Self-pay | Admitting: Family Medicine

## 2010-11-01 NOTE — Telephone Encounter (Signed)
i have never had this done, do not know how to do this , this is between the company  Nurse works for and her insurance

## 2010-11-02 NOTE — Telephone Encounter (Signed)
Patient aware, patient wants you to know she is having a lot of dizziness and has fell.

## 2010-11-02 NOTE — Telephone Encounter (Signed)
If her dizziness is due to place spinning or she feels as though she is spinning if she has no antivert pls erx 12.5mg  one twice daily as needed for vertigo #30 only. Let her know sorry to hear she has fallen..barium enema careful

## 2010-11-03 NOTE — Discharge Summary (Signed)
NAME:  [name], [name]                  ACCOUNT NO.:  [id number]  MEDICAL RECORD NO.:  [id number]  LOCATION:  5011                         FACILITY:  [facility]  PHYSICIAN:  Alvy Beal, MD    DATE OF BIRTH:  01-22-1951  DATE OF ADMISSION:  09/29/2010 DATE OF DISCHARGE:  10/04/2010                              DISCHARGE SUMMARY   ADMITTING DIAGNOSIS:  Lumbar spinal stenosis with neurogenic claudication.  DISCHARGE DIAGNOSIS:  Lumbar spinal stenosis with neurogenic claudication status post lumbar decompression, L3-S1.  SURGEON:  Alvy Beal, MD  ASSISTANT:  Norval Gable, PA-C  OPERATIVE PROCEDURE:  L3-S1 lumbar decompression.  ANESTHESIA:  General.  COMPLICATIONS:  None.  CONSULTS:  None.  HISTORY:  [name] is a 60 year old African American female who has been having progressively debilitating back, buttock, and bilateral leg pain. Because of an increased symptomatology and failure to improve with conservative measures consisting of injection therapy, physical therapy, activity modifications, and oral medications, [name] elected to proceed with surgery.  HOSPITAL COURSE:  On September 29, 2010, [name] was admitted to [facility] and underwent [name] above procedures without complications. [name]'s condition following [name] procedure was stable.  [name] was transferred to [name] PACU and subsequently to [name] orthopedic floor.  Postop day #1, [name] was doing well complaining of only mild nausea.  [name] was alert and oriented x3.  Her vital signs were stable. [name] was afebrile.  There was no shortness of breath or chest pain.  On physical exam, [name] abdomen was soft and nontender.  Neurovascular status was intact.  [name] would be evaluated later in [name] day by Inpatient Physical Therapy and Occupational Therapy.  On postop day #2, [name] was alert and oriented x3.  [name] was only complaining still of some nausea.  There had been no vomiting.  There was positive  flatus, however, [name] had not yet had a bowel movement.  On clinical exam, [name] abdomen was soft and nontender with bowel sounds in all four quadrants.  [name] was neurovascularly intact in her lower extremities.  [name] was able to dorsiflex and plantar flex both of her ankles.  There was no excessive bleeding or drainage from her wound.  Plan at that time was to be up and out of bed with physical therapy.  Removal of drains left in from surgery.  Zofran for her nausea.  That night, [name] had a hypoglycemic event and the PA on- call was contacted.  [name] had a blood sugar of 69.  [name] was given grape juice, cream crackers with peanut butter and three-quarters of a banana and subsequently recovered.  Postop day #3, [name] reports feeling weak.  Her CBC from October 01, 2010, demonstrates hemoglobin of 6.7.  Her neurovascular status was intact in [name] bilateral lower extremities.  Motor function was intact. Her temperature was 98.6, her pulse was 120, respirations were 18, blood pressure was 94/47.  [name] was subsequently transfused with 2 units of packed red blood cells for acute blood loss anemia.  Labs were ordered to be repeated in [name] morning.  On postop day #4, [name] reports being sleepy,  but otherwise comfortable.  Her hemoglobin was 9.6, which is up from 6.7 [name] previous day.  Her vital signs were stable.  [name] was afebrile.  There was no shortness of breath or chest pain.  [name] continued to ambulate with physical therapy.  [name] wound was clean, dry, and intact.  Neurovascular status was intact bilaterally in [name] lower extremities.  [name] had not yet had a bowel movement; however, was still passing gas.  [name] was otherwise considered stable with questionable constipation and was subsequently given magnesium citrate and a Fleet's enema.  We will recheck her labs in [name] morning to further evaluate her anemia.  On postop day #5, [name] reports feeling better with no  other complaints.  [name] did have a bowel movement.  [name] was tolerating a regular diet.  Her pain was adequately controlled with oral medications. [name] was ambulating without difficulty and cleared by Physical Therapy and Occupational Therapy.  DISCHARGE MEDICATIONS: 1. Colace 100 mg. 2. Milk of Magnesia. 3. Percocet 10/325. 4. Aciphex 20 mg. 5. Clonidine 0.3 mg. 6. Crestor 10 mg. 7. Diclofenac gel. 8. Evoxac 30 mg. 9. Flexeril 10 mg. 10.Glipizide 10 mg. 11.Hydrochlorothiazide 12.5 mg. 12.Klor-Con 20 mEq. 13.Lantus SoloSTAR pen. 14.Lorazepam 1 mg. 15.Lamotrigine 100 mg. 16.Levothroid 50 mcg. 17.Magic Mouth Wash 18.Meclizine 12.5 mg. 19.Metformin HCl 500 mg/5 mL. 20.Metoprolol tartrate 50 mg. 21.Mirtazapine 30 mg. 22.Nystatin topical powder. 23.Odansetron 4 mg. 24.Onglyza 5 mg. 25.Plavix 75 mg. 26.Restasis. 27.Reglan 5 mg. 28.Sertraline 50 mg. 29.Singulair 10 mg. 30.Thiothixene 2 mg. 31.Trazodone 150 mg. 32.Vitamin D 50,000 units. 33.Zoloft 50 mg.  [name] was instructed to stop [name] following medications: 1. Tramadol 50 mg. 2. Naproxen 250 mg. 3. Lomotil.  DISCHARGE/PLAN:  [name] was discharged to home with [name] assistance of Home Health Services.  Instructions were given to [name] at [name] time of discharge.  [name] and her family understand.  [name] will follow up in our office in 2 weeks for wound check, suture removal, and further discussion of [name] expected recovery course.  ACTIVITY AT [name] TIME OF DISCHARGE:  To walk with [name] assistance of a walker.  Increase activity slowly.  DIET:  Regular.  CONDITION AT [name] TIME OF DISCHARGE:  Stable.  All of [name]'s questions were encouraged, addressed, and answered. They know to contact our office if problems arise before her scheduled postop visit.    ______________________________ Norval Gable, PA   ______________________________ Alvy Beal, MD    DK/MEDQ  D:  10/13/2010  T:  10/14/2010   Job:  [id number]  Electronically Signed by Norval Gable PA on 10/31/2010 12:49:09 PM Electronically Signed by Venita Lick MD on 11/03/2010 07:29:57 AM

## 2010-11-03 NOTE — Telephone Encounter (Signed)
Called patient, no answer 

## 2010-11-04 MED ORDER — MECLIZINE HCL 12.5 MG PO TABS: 12.5000 mg | ORAL_TABLET | Freq: Three times a day (TID) | ORAL | Status: DC | PRN

## 2010-11-08 ENCOUNTER — Telehealth: Payer: Self-pay | Admitting: Family Medicine

## 2010-11-08 NOTE — Telephone Encounter (Signed)
Noted,

## 2010-11-08 NOTE — Telephone Encounter (Signed)
Advised patient to hold this evening.

## 2010-11-15 ENCOUNTER — Telehealth: Payer: Self-pay | Admitting: Family Medicine

## 2010-11-15 NOTE — Telephone Encounter (Signed)
noted 

## 2010-11-22 ENCOUNTER — Telehealth: Payer: Self-pay

## 2010-11-22 NOTE — Telephone Encounter (Signed)
Pt called and said she has had some abdominal pain, and today she has had light green stool. Normally has 2-3 stools daily. Has not seen blood in stool. Scheduled for ov with Gerrit Halls, NP on 11/30/2010 @ 2:30 pm. ( Had to schedule out because of scheduling with [facility].)

## 2010-11-23 ENCOUNTER — Telehealth: Payer: Self-pay | Admitting: Family Medicine

## 2010-11-23 DIAGNOSIS — R5381 Other malaise: Secondary | ICD-10-CM

## 2010-11-23 DIAGNOSIS — E119 Type 2 diabetes mellitus without complications: Secondary | ICD-10-CM

## 2010-11-23 DIAGNOSIS — D509 Iron deficiency anemia, unspecified: Secondary | ICD-10-CM

## 2010-11-23 DIAGNOSIS — E785 Hyperlipidemia, unspecified: Secondary | ICD-10-CM

## 2010-11-23 DIAGNOSIS — I10 Essential (primary) hypertension: Secondary | ICD-10-CM

## 2010-11-23 DIAGNOSIS — E669 Obesity, unspecified: Secondary | ICD-10-CM

## 2010-11-23 NOTE — Telephone Encounter (Signed)
Noted  

## 2010-11-24 NOTE — Telephone Encounter (Signed)
Labs ordered and sent to lab. Left message for patient

## 2010-11-24 NOTE — Telephone Encounter (Signed)
Needs cbc and anemia panel, cmp and egfr, and HBA1C and lipid pls

## 2010-11-24 NOTE — Telephone Encounter (Signed)
None has been ordered. What do you want her to have drawn?

## 2010-11-25 LAB — COMPLETE METABOLIC PANEL WITH GFR
Albumin: 4 g/dL (ref 3.5–5.2)
Alkaline Phosphatase: 117 U/L (ref 39–117)
BUN: 14 mg/dL (ref 6–23)
GFR, Est Non African American: >60 mL/min (ref >60)
Glucose, Bld: 136 mg/dL — ABNORMAL HIGH (ref 70–99)
Potassium: 4.2 mEq/L (ref 3.5–5.3)

## 2010-11-25 LAB — IRON AND TIBC
TIBC: 356 ug/dL (ref 250–470)
UIBC: 294 ug/dL (ref 125–400)

## 2010-11-25 LAB — HEMOGLOBIN A1C: Hgb A1c MFr Bld: 7.5 % — ABNORMAL HIGH (ref <5.7)

## 2010-11-25 LAB — LIPID PANEL
LDL Cholesterol: 85 mg/dL (ref 0–99)
Triglycerides: 150 mg/dL — ABNORMAL HIGH (ref <150)

## 2010-11-30 ENCOUNTER — Telehealth: Payer: Self-pay | Admitting: Gastroenterology

## 2010-11-30 ENCOUNTER — Telehealth: Payer: Self-pay | Admitting: Family Medicine

## 2010-11-30 ENCOUNTER — Encounter: Payer: Self-pay | Admitting: Gastroenterology

## 2010-11-30 ENCOUNTER — Ambulatory Visit (INDEPENDENT_AMBULATORY_CARE_PROVIDER_SITE_OTHER): Payer: Medicare Other | Admitting: Gastroenterology

## 2010-11-30 DIAGNOSIS — D649 Anemia, unspecified: Secondary | ICD-10-CM

## 2010-11-30 DIAGNOSIS — R109 Unspecified abdominal pain: Secondary | ICD-10-CM

## 2010-11-30 DIAGNOSIS — R1310 Dysphagia, unspecified: Secondary | ICD-10-CM

## 2010-11-30 DIAGNOSIS — K3184 Gastroparesis: Secondary | ICD-10-CM

## 2010-11-30 DIAGNOSIS — R1319 Other dysphagia: Secondary | ICD-10-CM

## 2010-11-30 MED ORDER — METOCLOPRAMIDE HCL 5 MG PO TABS: 5.0000 mg | ORAL_TABLET | Freq: Two times a day (BID) | ORAL | Status: DC

## 2010-11-30 NOTE — Telephone Encounter (Signed)
Called patient, left message.

## 2010-11-30 NOTE — Progress Notes (Signed)
Referring Provider: Syliva Overman, MD Primary Care Physician:  Syliva Overman, MD, MD Primary Gastroenterologist: Dr. Jena Gauss   Chief Complaint  Patient presents with  . Abdominal Pain    HPI:   [name] is a 60 year old female with multiple comorbidities, followed by [name] for gastroparesis, GERD, IBS, chronic anemia, and dysphagia. Has hx of chronic abdominal pain. Reports occasional constipation, no melena or brbpr. States has pale green stool. Points to lower abdomen, almost suprapubic area, stating crampy abdominal pain, not better after BM.  Still complains of dysphagia.    03/25/2009: BPE: 13 mm pill lodged, unable to pass, esophageal dysmotility, failed manometry at [facility]. MBSS 04/06/09: minimal oral and swallowing dysfunction GES 04/2009: poor gastric emptying, started on Reglan EGD 04/2009: normal, retained food, small H.H. EGD/ED/TCS 10/2009: non-critical Schatzki's ring, s/p dilation, left-sided diverticula  03/30/2010: CT benign findings for abdominal etiology, [name] benign  Lab Summary Latest Ref Rng 11/24/2010 10/04/2010 10/03/2010  Hemoglobin 12.0 - 15.0 g/dL (None) 9.3 (L) 9.6 (L)  Hematocrit 36.0 - 46.0 % (None) 29.5 (L) 30.3 (L)  White count 4.0 - 10.5 K/uL (None) 6.3 7.0  Platelet count 150 - 400 K/uL (None) 207 179  Sodium 135 - 145 mEq/L 143 (None) 140  Potassium 3.5 - 5.3 mEq/L 4.2 (None) 3.5  Calcium 8.4 - 10.5 mg/dL 9.5 (None) 8.4  Phosphorus  (None) (None) (None)  Creatinine 0.50 - 1.10 mg/dL 1.61 (None) <0.96 (L)  AST 0 - 37 U/L 22 (None) (None)  Alk Phos 39 - 117 U/L 117 (None) (None)  Bilirubin 0.3 - 1.2 mg/dL 0.3 (None) (None)  Glucose 70 - 99 mg/dL 045 (H) (None) 409 (H)  Cholesterol 0 - 200 mg/dL 811 (None) (None)  HDL cholesterol >39 mg/dL 48 (None) (None)  Triglycerides <150 mg/dL 914 (H) (None) (None)  LDL calc  (None) (None) (None)  LDL direct  (None) (None) (None)  Total protein 6.0 - 8.3 g/dL 7.4 (None) (None)  Albumin 3.5 - 5.2 g/dL 4.0 (None)  (None)     Levbid, Bentyl not on formulary.  Past Medical History  Diagnosis Date  . Bipolar disorder   . CVA (cerebral infarction)   . Pancreatitis 2006    due to Depakote with normal EUS   . Osteoporosis   . Sleep apnea   . Chronic back pain   . Diabetes mellitus   . Trigger finger   . Anxiety disorder   . Hypertension   . Migraines   . Diverticulosis     TCS 9/08 by Dr. Lina Sar for diarrhea . Bx for micro scopic colitis .   [name] Schatzki's ring     non critical / EGD with ED 8/2011with RMR  . S/P colonoscopy 7829,5621, 2011    left-sided diverticula, hx of simple adenomas   . Glaucoma     Past Surgical History  Procedure Date  . Abdominal hysterectomy   . Cholecystectomy   . Ovarian cyst removal   . Carpal tunnel release 07/22/04    left/ Dr. Romeo Apple   . Breast reduction surgery   . Bilateral cataract surgery   . Biopsy of thyroid gland 2009  . Surgical excision of 3 tumors from right thigh and right buttock  and left upper thigh 2010  . Back surgery July 2012    Current Outpatient Prescriptions  Medication Sig Dispense Refill  . ACIPHEX 20 MG tablet TAKE 1 TABLET BY MOUTH   ONCE DAILY BEFORE A MEAL.  31 each  11  . BD  INSULIN SYRINGE ULTRAFINE 31G X 5/16" 0.3 ML MISC USE AS DIRECTED.  100 each  3  . cloNIDine (CATAPRES) 0.3 MG tablet TAKE 1 TABLET BY MOUTH 3 TIMES DAILY: TAKE AT 8A.M.;4 P.M.; AND         MIDNIGHT.  90 tablet  4  . CRESTOR 10 MG tablet TAKE 1 TABLET BY MOUTH ATBEDTIME.  30 each  4  . Diphenhyd-Hydrocort-Nystatin (FIRST-DUKES MOUTHWASH) SUSP Use as directed in the mouth or throat. Take 2 teaspoons three times daily as needed for hoarseness       . diphenoxylate-atropine (LOMOTIL) 2.5-0.025 MG per tablet Take 1 tablet by mouth.        . ergocalciferol (VITAMIN D2) 50000 UNITS capsule Take 50,000 Units by mouth once a week.        [name] EVOXAC 30 MG capsule TAKE (1) CAPSULE BY MOUTHTHREE TIMES A DAY.  90 each  4  . FLEXERIL 10 MG tablet TAKE 1 TABLET  BY MOUTH   TWICE DAILY.  60 each  4  . furosemide (LASIX) 20 MG tablet Take 20 mg by mouth daily as needed.        [name] glipiZIDE (GLUCOTROL) 10 MG tablet TAKE 1 TABLET BY MOUTH   TWICE DAILY.  60 tablet  4  . glucose blood test strip Twice daily testing  100 each  5  . hydrochlorothiazide (,MICROZIDE/HYDRODIURIL,) 12.5 MG capsule TAKE ONE CAPSULE DAILY.  30 capsule  4  . HYDROcodone-acetaminophen (NORCO) 5-325 MG per tablet Take 1 tablet by mouth 3 (three) times daily as needed.        . insulin glargine (LANTUS SOLOSTAR) 100 UNIT/ML injection Inject 15 Units into the skin at bedtime.  10 mL  12  . lamoTRIgine (LAMICTAL) 100 MG tablet Take 100 mg by mouth daily.        . Lancets (ACCU-CHEK MULTICLIX) lancets Twice daily testing  100 each  5  . levothyroxine (LEVOXYL) 50 MCG tablet Take 50 mcg by mouth daily.        [name] LORazepam (ATIVAN) 1 MG tablet Take 1 mg by mouth every 8 (eight) hours.        . meclizine (ANTIVERT) 12.5 MG tablet Take 1 tablet (12.5 mg total) by mouth 3 (three) times daily as needed. For nausea   30 tablet  0  . metoprolol (LOPRESSOR) 50 MG tablet Take 50 mg by mouth 2 (two) times daily.        . mirtazapine (REMERON) 30 MG tablet Take 30 mg by mouth. Take one tablet by mouth three times daily         . nystatin (NYSTOP) 100000 UNIT/GM POWD APPLY TO AFFECTED AREAS  TWICE DAILY.  60 g  2  . ONGLYZA 5 MG TABS tablet TAKE 1 TABLET BY MOUTH   ONCE DAILY.  30 tablet  4  . PLAVIX 75 MG tablet TAKE ONE TABLET BY MOUTH ONCE DAILY.  30 each  4  . potassium chloride SA (KLOR-CON M20) 20 MEQ tablet Take 20 mEq by mouth. As needed with furosemide         . RIOMET 500 MG/5ML SOLN TAKE 2 TEASPOONFULS      (10ml) BY MOUTH TWICEDAILY.  600 mL  3  . sertraline (ZOLOFT) 50 MG tablet Take 50 mg by mouth. Take three tablets by mouth once daily       . SINGULAIR 10 MG tablet TAKE ONE TABLET DAILY.  30 each  4  . thiothixene (NAVANE) 2  MG capsule Take 2 mg by mouth at bedtime.        . traZODone  (DESYREL) 150 MG tablet Take 150 mg by mouth at bedtime.        . VOLTAREN 1 % GEL APPLY TWICE DAILY TO     AFFECTED AREA(S) ASNEEDED FOR PAIN.  100 g  2    Allergies as of 11/30/2010 - Review Complete 11/30/2010  Allergen Reaction Noted  . Cephalexin    . Milk-related compounds  07/29/2008  . Penicillins    . Phenazopyridine hcl  06/19/2007    Family History  Problem Relation Age of Onset  . Heart attack Mother   . Pneumonia Father   . Kidney failure Father   . Cancer Sister     pancreatic  . Diabetes Brother   . Hypertension Brother   . Colon cancer Neg Hx     History   Social History  . Marital Status: Divorced    Spouse Name: N/A    Number of Children: 1  . Years of Education: N/A   Occupational History  . disabled     Social History Main Topics  . Smoking status: Former Games developer  . Smokeless tobacco: None  . Alcohol Use: No  . Drug Use: No  . Sexually Active: None   Other Topics Concern  . None   Social History Narrative  . None    Review of Systems: Gen: Denies fever, chills, anorexia. Denies fatigue, weakness, weight loss.  CV: Denies chest pain, palpitations, syncope, peripheral edema, and claudication. Resp: Denies dyspnea at rest, cough, wheezing, coughing up blood, and pleurisy. GI: Denies vomiting blood, jaundice, and fecal incontinence.   Denies dysphagia or odynophagia. Derm: Denies rash, itching, dry skin Psych: Denies depression, anxiety, memory loss, confusion. No homicidal or suicidal ideation.  Heme: Denies bruising, bleeding, and enlarged lymph nodes.  Physical Exam: BP 130/75  Pulse 80  Temp(Src) 97.4 F (36.3 C) (Temporal)  Ht 5\' 2"  (1.575 m)  Wt 166 lb (75.297 kg)  BMI 30.36 kg/m2 General:   Alert and oriented. No distress noted. Drowsy, fell asleep during interview several times.  Head:  Normocephalic and atraumatic. Eyes:  Conjuctiva clear without scleral icterus. Mouth:  Oral mucosa pink and moist. No lesions. Neck:   Supple, without mass or thyromegaly. Heart:  S1, S2 present without murmurs, rubs, or gallops. Regular rate and rhythm. Abdomen:  +BS, soft, mildly tender to palpation lower abdomen, "band-like" and non-distended. No rebound or guarding. No HSM or masses noted. Msk:  Symmetrical without gross deformities. Normal posture. Extremities:  Without edema. Neurologic:  Alert and  oriented x4;  grossly normal neurologically. Skin:  Intact without significant lesions or rashes. Cervical Nodes:  No significant cervical adenopathy. Psych:  Alert and cooperative. Normal mood and affect.

## 2010-11-30 NOTE — Telephone Encounter (Signed)
Please have patient STOP Reglan. Do not take. I ordered this during the office visit, but she is on multiple medications and some can have potential reactions.

## 2010-11-30 NOTE — Patient Instructions (Addendum)
ONLY TAKE Reglan 30 minutes before breakfast and 30 minutes before dinner. (We have decreased this dose). ADDENDUM: Do NOT Take Reglan anymore.   Please complete labs and stool sample.  After review of this, we will be setting you up for an endoscopy with Dr. Jena Gauss.

## 2010-12-01 LAB — BASIC METABOLIC PANEL
BUN: 9
CO2: 25
Calcium: 9
Chloride: 107
Creatinine, Ser: 0.6
GFR calc Af Amer: >60
GFR calc non Af Amer: >60
Glucose, Bld: 146 — ABNORMAL HIGH
Potassium: 3.1 — ABNORMAL LOW
Sodium: 141

## 2010-12-01 LAB — HEMOGLOBIN AND HEMATOCRIT, BLOOD
HCT: 31.1 — ABNORMAL LOW
Hemoglobin: 10 — ABNORMAL LOW

## 2010-12-01 LAB — POCT I-STAT 4, (NA,K, GLUC, HGB,HCT)
HCT: 38
Operator id: 282951
Sodium: 143

## 2010-12-01 NOTE — Telephone Encounter (Signed)
Called patient, left message.

## 2010-12-01 NOTE — Telephone Encounter (Signed)
Opened in error

## 2010-12-01 NOTE — Telephone Encounter (Signed)
Called pt and asked her to stop taking the Reglan and if she has it in her medication dispencer to have [facility] remove it. Called [facility] and spoke with Santina Evans and informed her to make sure pt takes no more Reglan.

## 2010-12-02 ENCOUNTER — Telehealth: Payer: Self-pay | Admitting: Family Medicine

## 2010-12-02 LAB — BASIC METABOLIC PANEL
BUN: 13
CO2: 25
Chloride: 110
Glucose, Bld: 288 — ABNORMAL HIGH
Potassium: 3.5
Sodium: 139

## 2010-12-02 NOTE — Telephone Encounter (Signed)
Patient asked about labs, she had some recently

## 2010-12-04 DIAGNOSIS — R109 Unspecified abdominal pain: Secondary | ICD-10-CM | POA: Insufficient documentation

## 2010-12-04 DIAGNOSIS — D649 Anemia, unspecified: Secondary | ICD-10-CM | POA: Insufficient documentation

## 2010-12-04 NOTE — Assessment & Plan Note (Signed)
Complicated hx with most recent EGD/ED in Aug 2011. Failed manometry at [facility]. Assessing CBC and ifobt due to hx of anemia prior to proceeding with EGD/ED. If +ifobt, will place capsule at time of EGD. Likely after dilation, will need referral again to [facility]. As of note, down 3 lbs since Feb 2012.

## 2010-12-04 NOTE — Assessment & Plan Note (Signed)
Chronic anemia, EGD/colonoscopy up-to-date. Needs updated CBC, ifobt. Appears overall Hgb drifting. If positive ifobt, will need to proceed with capsule endoscopy with placement via EGD, large bore dilation.

## 2010-12-04 NOTE — Assessment & Plan Note (Signed)
60 year old female with multiple medical issues, chronic abdominal pain. Described as almost suprapubic, band-like distribution across lower abdomen. CT and [name] Jan 2012 unrevealing. Likely component of IBS, chronic abdominal pain. Levbid and Bentyl are not on formulary.  Colonoscopy fairly up-to-date. Will order CBC, BMP, UA and urine culture (?urinary etiology due to location, mild urinary symptoms such as frequency). Add ifobt.

## 2010-12-04 NOTE — Assessment & Plan Note (Signed)
As documented in Feb 2011. Multiple meds prescribed. Do not feel comfortable continuing Reglan at this point, especially with SSRI. If pt unable to tolerate cessation of this, consider domperidone. Continue Aciphex.

## 2010-12-05 ENCOUNTER — Telehealth: Payer: Self-pay | Admitting: Family Medicine

## 2010-12-05 DIAGNOSIS — R51 Headache: Secondary | ICD-10-CM

## 2010-12-05 NOTE — Progress Notes (Signed)
Cc to PCP 

## 2010-12-05 NOTE — Telephone Encounter (Signed)
Pt called after hours line, she fell last Thursday hitting a coffee table, breaking the glass. She has had an occipital headache since then, and has been confused, ? Seeing things. She states she has had HA before and multiple falls. She is on Plavix. I advised her to go [facility] for CT head, she said she would rather wait until the AM as she has no ride. I advised her not to take a  Lot of pain meds to treat the HA and we will set her up for CT head in the morning.She was given red flags to call EMS tonight if needed

## 2010-12-07 ENCOUNTER — Ambulatory Visit (HOSPITAL_COMMUNITY): Admission: RE | Admit: 2010-12-07 | Payer: Medicare Other | Source: Ambulatory Visit

## 2010-12-07 LAB — COMPREHENSIVE METABOLIC PANEL
ALT: 14 U/L (ref 0–35)
AST: 17 U/L (ref 0–37)
CO2: 28 mEq/L (ref 19–32)
Calcium: 9.7 mg/dL (ref 8.4–10.5)
Chloride: 104 mEq/L (ref 96–112)
Potassium: 4.2 mEq/L (ref 3.5–5.3)
Sodium: 142 mEq/L (ref 135–145)
Total Protein: 7.4 g/dL (ref 6.0–8.3)

## 2010-12-08 LAB — IRON AND TIBC
Saturation Ratios: 23
UIBC: 218

## 2010-12-08 LAB — CBC WITH DIFFERENTIAL/PLATELET
HCT: 36.8 % (ref 36.0–46.0)
Hemoglobin: 10.8 g/dL — ABNORMAL LOW (ref 12.0–15.0)
Lymphocytes Relative: 35 % (ref 12–46)
Lymphs Abs: 2.7 10*3/uL (ref 0.7–4.0)
MCHC: 29.3 g/dL — ABNORMAL LOW (ref 30.0–36.0)
Monocytes Absolute: 0.5 10*3/uL (ref 0.1–1.0)
Monocytes Relative: 6 % (ref 3–12)
Neutro Abs: 4.4 10*3/uL (ref 1.7–7.7)
RBC: 4.93 MIL/uL (ref 3.87–5.11)
WBC: 7.8 10*3/uL (ref 4.0–10.5)

## 2010-12-08 LAB — COMPREHENSIVE METABOLIC PANEL
ALT: 17
ALT: 19
AST: 22
Albumin: 2.9 — ABNORMAL LOW
BUN: 12
CO2: 24
Calcium: 8.7
Calcium: 8.8
Chloride: 109
Creatinine, Ser: 0.7
Creatinine, Ser: 0.7
GFR calc Af Amer: >60
GFR calc non Af Amer: >60
Glucose, Bld: 102 — ABNORMAL HIGH
Sodium: 139
Sodium: 141
Total Protein: 6.5

## 2010-12-08 LAB — URINALYSIS, ROUTINE W REFLEX MICROSCOPIC
Bilirubin Urine: NEGATIVE
Glucose, UA: NEGATIVE
Hgb urine dipstick: NEGATIVE
Ketones, ur: NEGATIVE
Specific Gravity, Urine: 1.03
Specific Gravity, Urine: >1.03 — ABNORMAL HIGH
pH: 5

## 2010-12-08 LAB — BASIC METABOLIC PANEL
BUN: 14
BUN: 15
CO2: 20
CO2: 23
CO2: 21
Calcium: 8.4
Calcium: 8.9
Chloride: 105
Chloride: 106
Creatinine, Ser: 0.78
Creatinine, Ser: 0.84
GFR calc Af Amer: >60
GFR calc Af Amer: 19 — ABNORMAL LOW
GFR calc non Af Amer: 16 — ABNORMAL LOW
GFR calc non Af Amer: >60
Glucose, Bld: 133 — ABNORMAL HIGH
Glucose, Bld: 151 — ABNORMAL HIGH
Potassium: 3.4 — ABNORMAL LOW
Potassium: 3.7
Potassium: 4.7
Potassium: 5.1
Sodium: 135
Sodium: 135

## 2010-12-08 LAB — BLOOD GAS, ARTERIAL
TCO2: 14.5
pCO2 arterial: 21.4 — ABNORMAL LOW
pH, Arterial: 7.49 — ABNORMAL HIGH
pO2, Arterial: 114 — ABNORMAL HIGH

## 2010-12-08 LAB — CULTURE, BLOOD (ROUTINE X 2)
Culture: NO GROWTH
Report Status: 7142009
Report Status: 7142009

## 2010-12-08 LAB — DIFFERENTIAL
Basophils Absolute: 0
Basophils Absolute: 0
Basophils Absolute: 0
Basophils Relative: 0
Eosinophils Relative: 0
Eosinophils Relative: 1
Eosinophils Relative: 1
Eosinophils Relative: 2
Eosinophils Relative: 1
Lymphocytes Relative: 40
Lymphocytes Relative: 23
Lymphocytes Relative: 41
Lymphs Abs: 1.4
Lymphs Abs: 1.6
Lymphs Abs: 2.5
Lymphs Abs: 2.7
Lymphs Abs: 2.6
Monocytes Absolute: 0.2
Monocytes Absolute: 0.3
Monocytes Absolute: 0.4
Monocytes Relative: 3
Monocytes Relative: 5
Monocytes Relative: 5
Monocytes Relative: 8
Monocytes Relative: 5
Neutro Abs: 4.2
Neutro Abs: 5.6
Neutro Abs: 3.1
Neutro Abs: 6.3
Neutrophils Relative %: 49
Neutrophils Relative %: 48

## 2010-12-08 LAB — HEPATIC FUNCTION PANEL
ALT: 16
AST: 18
Total Protein: 7.7

## 2010-12-08 LAB — CBC
HCT: 36.2
HCT: 38.6
HCT: 28.6 — ABNORMAL LOW
HCT: 31.6 — ABNORMAL LOW
Hemoglobin: 11.5 — ABNORMAL LOW
Hemoglobin: 9.7 — ABNORMAL LOW
MCHC: 31.4
MCHC: 32.2
MCHC: 30.6
MCV: 69 — ABNORMAL LOW
MCV: 69.7 — ABNORMAL LOW
MCV: 69 — ABNORMAL LOW
MCV: 69.7 — ABNORMAL LOW
Platelets: 330
Platelets: 259
Platelets: 279
Platelets: 178
RBC: 5.25 — ABNORMAL HIGH
RBC: 4.66
RBC: 4.4
RDW: 15.7 — ABNORMAL HIGH
WBC: 5.9
WBC: 7.6
WBC: 6.1
WBC: 6.5
WBC: 6.4

## 2010-12-08 LAB — T3, FREE: T3, Free: 2.9 (ref 2.3–4.2)

## 2010-12-08 LAB — CARDIAC PANEL(CRET KIN+CKTOT+MB+TROPI)
CK, MB: 1.4
CK, MB: 1.6
CK, MB: 1.6
Relative Index: INVALID
Total CK: 95
Troponin I: 0.01
Troponin I: 0.01

## 2010-12-08 LAB — AMYLASE: Amylase: 54

## 2010-12-08 LAB — RAPID URINE DRUG SCREEN, HOSP PERFORMED
Amphetamines: NOT DETECTED
Barbiturates: NOT DETECTED
Benzodiazepines: POSITIVE — AB
Cocaine: NOT DETECTED

## 2010-12-08 LAB — PHOSPHORUS: Phosphorus: 4.2

## 2010-12-08 LAB — FERRITIN: Ferritin: 171 (ref 10–291)

## 2010-12-08 LAB — OCCULT BLOOD X 1 CARD TO LAB, STOOL: Fecal Occult Bld: NEGATIVE

## 2010-12-08 LAB — URINE MICROSCOPIC-ADD ON

## 2010-12-08 LAB — RPR: RPR Ser Ql: NONREACTIVE

## 2010-12-08 LAB — CK TOTAL AND CKMB (NOT AT ARMC)
CK, MB: 1.9
Relative Index: 1.4

## 2010-12-08 LAB — URINE CULTURE
Colony Count: NO GROWTH
Culture: NO GROWTH
Special Requests: NEGATIVE

## 2010-12-08 LAB — T4, FREE: Free T4: 1.11

## 2010-12-08 LAB — B-NATRIURETIC PEPTIDE (CONVERTED LAB): Pro B Natriuretic peptide (BNP): <30

## 2010-12-08 LAB — VALPROIC ACID LEVEL: Valproic Acid Lvl: <10 — ABNORMAL LOW

## 2010-12-08 LAB — MAGNESIUM: Magnesium: 1.7

## 2010-12-09 ENCOUNTER — Encounter: Payer: Self-pay | Admitting: Family Medicine

## 2010-12-09 LAB — URINE CULTURE

## 2010-12-12 ENCOUNTER — Emergency Department (HOSPITAL_COMMUNITY): Payer: Medicare Other

## 2010-12-12 ENCOUNTER — Ambulatory Visit (INDEPENDENT_AMBULATORY_CARE_PROVIDER_SITE_OTHER): Payer: Medicare Other | Admitting: Family Medicine

## 2010-12-12 ENCOUNTER — Encounter: Payer: Self-pay | Admitting: Family Medicine

## 2010-12-12 ENCOUNTER — Emergency Department (HOSPITAL_COMMUNITY)
Admission: EM | Admit: 2010-12-12 | Discharge: 2010-12-12 | Disposition: A | Discharge status: Home / Self Care | Payer: Medicare Other | Attending: Emergency Medicine | Admitting: Emergency Medicine

## 2010-12-12 ENCOUNTER — Encounter (HOSPITAL_COMMUNITY): Payer: Self-pay | Admitting: *Deleted

## 2010-12-12 VITALS — BP 124/72 | HR 95 | Resp 16 | Ht 62.0 in | Wt 168.0 lb

## 2010-12-12 DIAGNOSIS — F060 Psychotic disorder with hallucinations due to known physiological condition: Secondary | ICD-10-CM

## 2010-12-12 DIAGNOSIS — IMO0002 Reserved for concepts with insufficient information to code with codable children: Secondary | ICD-10-CM

## 2010-12-12 DIAGNOSIS — W010XXA Fall on same level from slipping, tripping and stumbling without subsequent striking against object, initial encounter: Secondary | ICD-10-CM | POA: Insufficient documentation

## 2010-12-12 DIAGNOSIS — I10 Essential (primary) hypertension: Secondary | ICD-10-CM | POA: Insufficient documentation

## 2010-12-12 DIAGNOSIS — Y92009 Unspecified place in unspecified non-institutional (private) residence as the place of occurrence of the external cause: Secondary | ICD-10-CM | POA: Insufficient documentation

## 2010-12-12 DIAGNOSIS — E119 Type 2 diabetes mellitus without complications: Secondary | ICD-10-CM | POA: Insufficient documentation

## 2010-12-12 DIAGNOSIS — Z23 Encounter for immunization: Secondary | ICD-10-CM

## 2010-12-12 DIAGNOSIS — R296 Repeated falls: Secondary | ICD-10-CM | POA: Insufficient documentation

## 2010-12-12 DIAGNOSIS — Z9181 History of falling: Secondary | ICD-10-CM

## 2010-12-12 DIAGNOSIS — Z8673 Personal history of transient ischemic attack (TIA), and cerebral infarction without residual deficits: Secondary | ICD-10-CM | POA: Insufficient documentation

## 2010-12-12 DIAGNOSIS — W19XXXA Unspecified fall, initial encounter: Secondary | ICD-10-CM

## 2010-12-12 DIAGNOSIS — F329 Major depressive disorder, single episode, unspecified: Secondary | ICD-10-CM

## 2010-12-12 DIAGNOSIS — R4182 Altered mental status, unspecified: Secondary | ICD-10-CM | POA: Insufficient documentation

## 2010-12-12 DIAGNOSIS — E785 Hyperlipidemia, unspecified: Secondary | ICD-10-CM

## 2010-12-12 DIAGNOSIS — F319 Bipolar disorder, unspecified: Secondary | ICD-10-CM | POA: Insufficient documentation

## 2010-12-12 DIAGNOSIS — IMO0001 Reserved for inherently not codable concepts without codable children: Secondary | ICD-10-CM | POA: Insufficient documentation

## 2010-12-12 DIAGNOSIS — M549 Dorsalgia, unspecified: Secondary | ICD-10-CM | POA: Insufficient documentation

## 2010-12-12 DIAGNOSIS — H409 Unspecified glaucoma: Secondary | ICD-10-CM | POA: Insufficient documentation

## 2010-12-12 LAB — GLUCOSE, CAPILLARY: Glucose-Capillary: 162 mg/dL — ABNORMAL HIGH (ref 70–99)

## 2010-12-12 MED ORDER — INSULIN GLARGINE 100 UNIT/ML ~~LOC~~ SOLN: 22.0000 [IU] | Freq: Every day | SUBCUTANEOUS | Status: DC

## 2010-12-12 NOTE — ED Notes (Signed)
Pt left er stating no pain

## 2010-12-12 NOTE — Assessment & Plan Note (Signed)
Recurrent falls 3 in past 1 month counseled pt to seriously consider assisted living facility once more

## 2010-12-12 NOTE — Progress Notes (Signed)
  Subjective:    Patient ID: [name], female    DOB: [birth year], 60 y.o.   MRN: [id number]  HPI The PT is here for follow up and re-evaluation of chronic medical conditions, medication management and review of any available recent lab and radiology data.  Preventive health is updated, specifically  Cancer screening and Immunization.   She had sucesful back surgery in July, reports less discomfort, but has fallen 3 times in the past 1 month. She does not want to consider assisted living at this time , but I encourage her to give it much thought.She had called 1 day last week, was advised to have head ct scan which was ordered, neve went , and today she is actually here from being seen earlier in the ED for another fall. Reports that fasting sugars are between 120 to 130, denies polyuria, polydipsia , hypoglycemic episodes or blurred vision. She has had her eye exam this year. She continues to follow with endocrinology re thyroid disease. She also follows with the surgeon who recently operated on her    Review of Systems See HPI Denies recent fever or chills. Denies sinus pressure, nasal congestion, ear pain or sore throat. Denies chest congestion, productive cough or wheezing. Denies chest pains, palpitations and leg swelling Denies abdominal pain, nausea, vomiting,diarrhea or constipation.   Denies dysuria, frequency, hesitancy or incontinence.  Denies headaches, seizures, numbness, or tingling. Denies uncontrolled depression, anxiety or insomnia. Denies skin break down or rash.        Objective:   Physical Exam Patient alert and oriented and in no cardiopulmonary distress.  HEENT: No facial asymmetry, EOMI, no sinus tenderness,  oropharynx pink and moist.  Neck supple no adenopathy.  Chest: Clear to auscultation bilaterally.  CVS: S1, S2 no murmurs, no S3.  ABD: Soft non tender. Bowel sounds normal.  Ext: No edema  MS: decreased though adequate ROM spine, shoulders,  hips and knees.  Skin: Intact, no ulcerations or rash noted.  Psych: Good eye contact, normal affect. Memory mildly impaired not anxious or depressed appearing.  CNS: CN 2-12 intact, power,  normal throughout.        Assessment & Plan:

## 2010-12-12 NOTE — Assessment & Plan Note (Signed)
Currently stable, pt to continue to follow with mental health

## 2010-12-12 NOTE — ED Notes (Signed)
[facility] reports that patient found lying in bathroom for unknown amount of time, CBG 199, recent back surgery

## 2010-12-12 NOTE — Assessment & Plan Note (Signed)
Improved pain following recent  Back surgery

## 2010-12-12 NOTE — Patient Instructions (Signed)
F/u end December or early January.  Dose increase on lantus to 22 units at bedtime. Pls call in 4 weeks if your morning blood sugars are still averaging over 130   Flu vaccine today.  Labs are generally good  I am very concerned about your frequent falls , you really need to consider going to an assisted living facility

## 2010-12-12 NOTE — Assessment & Plan Note (Signed)
Controlled, no change in medication  

## 2010-12-12 NOTE — ED Provider Notes (Signed)
History     CSN: [id number] Arrival date & time: 12/12/2010  1:55 AM  Chief Complaint  Patient presents with  . Altered Mental Status    (Consider location/radiation/quality/duration/timing/severity/associated sxs/prior treatment) HPI Comments: Seen upon arrival at 0218. Patient states that when she was unable to get up she pulled the cord in the bathroom. It sets off an alarm in the apartment coredor. It took a long time for a neighbor to realize the cord had been pulled and he called the police. The patient states that because she had taken her night medicines, when she had no immediate response, she fell asleep on the floor. She was awakened when the police and EMS broke into her apartment.  Patient is a 60 y.o. female presenting with fall. The history is provided by the patient (Patient states she took her nighttime medicines and got up to go to the bathroom. She slipped of the toilet and could not get off the floor. She finally pulled the alarm rope in the bathroom. A neighbor heard it go off and called EMS.).  Fall The accident occurred 3 to 5 hours ago (fall occured around 930 pm. She was not brought to the hospital until 0200). Incident: slipped off the toilet while wiping her bottom. She fell from a height of 1 to 2 ft. She landed on a hard floor. Point of impact: fell on bottom. The pain is at a severity of 0/10. The patient is experiencing no pain. She was not ambulatory at the scene. There was no entrapment (unable to get up on her own) after the fall.    Past Medical History  Diagnosis Date  . Bipolar disorder   . CVA (cerebral infarction)   . Pancreatitis 2006    due to Depakote with normal EUS   . Osteoporosis   . Sleep apnea   . Chronic back pain   . Diabetes mellitus   . Trigger finger   . Anxiety disorder   . Hypertension   . Migraines   . Diverticulosis     TCS 9/08 by Dr. Lina Sar for diarrhea . Bx for micro scopic colitis .   [name] Schatzki's ring     non  critical / EGD with ED 8/2011with RMR  . S/P colonoscopy 4098,1191, 2011    left-sided diverticula, hx of simple adenomas   . Glaucoma     Past Surgical History  Procedure Date  . Abdominal hysterectomy   . Cholecystectomy   . Ovarian cyst removal   . Carpal tunnel release 07/22/04    left/ Dr. Romeo Apple   . Breast reduction surgery   . Bilateral cataract surgery   . Biopsy of thyroid gland 2009  . Surgical excision of 3 tumors from right thigh and right buttock  and left upper thigh 2010  . Back surgery July 2012    Family History  Problem Relation Age of Onset  . Heart attack Mother   . Pneumonia Father   . Kidney failure Father   . Cancer Sister     pancreatic  . Diabetes Brother   . Hypertension Brother   . Colon cancer Neg Hx     History  Substance Use Topics  . Smoking status: Former Games developer  . Smokeless tobacco: Not on file  . Alcohol Use: No    OB History    Grav Para Term Preterm Abortions TAB SAB Ect Mult Living  Review of Systems  All other systems reviewed and are negative.    Allergies  Cephalexin; Milk-related compounds; Penicillins; and Phenazopyridine hcl  Home Medications   Current Outpatient Rx  Name Route Sig Dispense Refill  . ACIPHEX 20 MG PO TBEC  TAKE 1 TABLET BY MOUTH   ONCE DAILY BEFORE A MEAL. 31 each 11  . CLONIDINE HCL 0.3 MG PO TABS  TAKE 1 TABLET BY MOUTH 3 TIMES DAILY: TAKE AT 8A.M.;4 P.M.; AND         MIDNIGHT. 90 tablet 4  . CRESTOR 10 MG PO TABS  TAKE 1 TABLET BY MOUTH ATBEDTIME. 30 each 4  . DIPHENOXYLATE-ATROPINE 2.5-0.025 MG PO TABS Oral Take 1 tablet by mouth.      . ERGOCALCIFEROL 50000 UNITS PO CAPS Oral Take 50,000 Units by mouth once a week.      [name] EVOXAC 30 MG PO CAPS  TAKE (1) CAPSULE BY MOUTHTHREE TIMES A DAY. 90 each 4  . FLEXERIL 10 MG PO TABS  TAKE 1 TABLET BY MOUTH   TWICE DAILY. 60 each 4  . GLIPIZIDE 10 MG PO TABS  TAKE 1 TABLET BY MOUTH   TWICE DAILY. 60 tablet 4  . GLUCOSE BLOOD VI STRP   Twice daily testing 100 each 5  . HYDROCHLOROTHIAZIDE 12.5 MG PO CAPS  TAKE ONE CAPSULE DAILY. 30 capsule 4  . LAMOTRIGINE 100 MG PO TABS Oral Take 100 mg by mouth daily.      [name] LEVOTHYROXINE SODIUM 50 MCG PO TABS Oral Take 50 mcg by mouth daily.      [name] LORAZEPAM 1 MG PO TABS Oral Take 1 mg by mouth every 8 (eight) hours.      [name] MECLIZINE HCL 12.5 MG PO TABS Oral Take 1 tablet (12.5 mg total) by mouth 3 (three) times daily as needed. For nausea  30 tablet 0  . METOPROLOL TARTRATE 50 MG PO TABS Oral Take 50 mg by mouth 2 (two) times daily.      [name] MIRTAZAPINE 30 MG PO TABS Oral Take 30 mg by mouth. Take one tablet by mouth three times daily       . ONGLYZA 5 MG PO TABS  TAKE 1 TABLET BY MOUTH   ONCE DAILY. 30 tablet 4  . PLAVIX 75 MG PO TABS  TAKE ONE TABLET BY MOUTH ONCE DAILY. 30 each 4    Dispense as written.  [name] PREGABALIN 75 MG PO CAPS Oral Take 75 mg by mouth 2 (two) times daily.      . SERTRALINE HCL 50 MG PO TABS Oral Take 100 mg by mouth. Take three tablets by mouth once daily    . SINGULAIR 10 MG PO TABS  TAKE ONE TABLET DAILY. 30 each 4  . THIOTHIXENE 2 MG PO CAPS Oral Take 2 mg by mouth at bedtime.      . TRAZODONE HCL 150 MG PO TABS Oral Take 150 mg by mouth at bedtime.      . BD INSULIN SYRINGE ULTRAFINE 31G X 5/16" 0.3 ML MISC  USE AS DIRECTED. 100 each 3  . FIRST-DUKES MOUTHWASH MT SUSP Mouth/Throat Use as directed in the mouth or throat. Take 2 teaspoons three times daily as needed for hoarseness     . FUROSEMIDE 20 MG PO TABS Oral Take 20 mg by mouth daily as needed.      [name] HYDROCODONE-ACETAMINOPHEN 5-325 MG PO TABS Oral Take 1 tablet by mouth 3 (three) times daily as needed.      [name]  INSULIN GLARGINE 100 UNIT/ML [place] SOLN Subcutaneous Inject 15 Units into the skin at bedtime. 10 mL 12    Dose increase effective 08/26/2010  . ACCU-CHEK MULTICLIX LANCETS MISC  Twice daily testing 100 each 5  . NYSTOP 100000 UNIT/GM EX POWD  APPLY TO AFFECTED AREAS  TWICE DAILY. 60 g 2  . POTASSIUM  CHLORIDE CRYS CR 20 MEQ PO TBCR Oral Take 20 mEq by mouth. As needed with furosemide       . RIOMET 500 MG/5ML PO SOLN  TAKE 2 TEASPOONFULS      (10ml) BY MOUTH TWICEDAILY. 600 mL 3  . VOLTAREN 1 % TD GEL  APPLY TWICE DAILY TO     AFFECTED AREA(S) ASNEEDED FOR PAIN. 100 g 2    BP 123/63  Pulse 71  Temp(Src) 97.7 F (36.5 C) (Oral)  Resp 18  SpO2 100%  Physical Exam  Nursing note and vitals reviewed. Constitutional: She is oriented to person, place, and time. She appears well-developed and well-nourished.       Sleepy but easily arousable  HENT:  Head: Normocephalic and atraumatic.  Right Ear: External ear normal.  Left Ear: External ear normal.  Mouth/Throat: Oropharynx is clear and moist.       edentulous  Eyes: EOM are normal. Pupils are equal, round, and reactive to light.  Neck: Normal range of motion. Neck supple.  Cardiovascular: Normal rate, normal heart sounds and intact distal pulses.   Pulmonary/Chest: Effort normal and breath sounds normal.  Abdominal: Soft. Bowel sounds are normal.  Musculoskeletal: Normal range of motion.  Neurological: She is alert and oriented to person, place, and time. She has normal reflexes. A cranial nerve deficit is present.  Skin: Skin is warm and dry.    ED Course  Procedures (including critical care time)  Labs Reviewed - No data to display Ct Head Wo Contrast  12/12/2010  *RADIOLOGY REPORT*  Clinical Data: Altered mental status.  Found on the bathroom floor.  CT HEAD WITHOUT CONTRAST  Technique:  Contiguous axial images were obtained from the base of the skull through the vertex without contrast.  Comparison: 05/16/2010  Findings: The ventricles and sulci appear symmetrical.  No mass effect or midline shift.  No significant ventricular dilatation. No abnormal extra-axial fluid collections.  Gray-white matter junctions are distinct.  Basal cisterns are not effaced.  No evidence of acute intracranial hemorrhage.  Arterial vascular  calcifications.  No depressed skull fractures.  Visualized paranasal sinuses are not opacified.  No significant changes since the previous study.  IMPRESSION: No evidence of acute intracranial hemorrhage, mass lesion, or acute infarct.  Original Report Authenticated By: Marlon Pel, M.D.   Patient under the influence of her night medicines fell off the toilet and was unable to get up on her own. When there was no immediate response, she fell asleep on the floor. She had no pain c/o. Daughter who came to the hospital stated patient had fallen two weeks ago and hit her head. Requested CT of head. CT was negative. Pt stable in ED with no significant deterioration in condition. MDM Reviewed: nursing note and vitals Interpretation: CT scan          Nicoletta Dress. Colon Branch, MD 12/12/10 1610

## 2010-12-12 NOTE — Assessment & Plan Note (Signed)
Uncontrolled, increase lantus to 22 units, call in 4 weeks for further inc if needed

## 2010-12-12 NOTE — ED Notes (Signed)
Pt given gram cracker and apple juice per her request and dr strands ok.

## 2010-12-12 NOTE — Assessment & Plan Note (Signed)
Uncontrolled pt to increase the insulin dose to 22 units, follow low carb diet

## 2010-12-12 NOTE — ED Notes (Signed)
Pt found in bathroom very sleepy, pt appropriate, no neurologic deficits noted pt very sleepy. Pt stated she takes medications to help her sleep. Pt stating no pain medications. Back surgery in July of this year incision healed.

## 2010-12-13 ENCOUNTER — Ambulatory Visit: Payer: Medicare Other | Admitting: Gastroenterology

## 2010-12-13 DIAGNOSIS — R109 Unspecified abdominal pain: Secondary | ICD-10-CM

## 2010-12-13 LAB — DIFFERENTIAL
Basophils Absolute: 0 10*3/uL (ref 0.0–0.1)
Basophils Relative: 0 % (ref 0–1)
Basophils Relative: 0 % (ref 0–1)
Eosinophils Absolute: 0.1 10*3/uL (ref 0.0–0.7)
Eosinophils Absolute: 0.2 10*3/uL (ref 0.0–0.7)
Eosinophils Relative: 1 % (ref 0–5)
Eosinophils Relative: 2 % (ref 0–5)
Eosinophils Relative: 2 % (ref 0–5)
Lymphocytes Relative: 28 % (ref 12–46)
Lymphocytes Relative: 45 % (ref 12–46)
Lymphocytes Relative: 53 % — ABNORMAL HIGH (ref 12–46)
Lymphs Abs: 3.7 10*3/uL (ref 0.7–4.0)
Monocytes Absolute: 0.5 10*3/uL (ref 0.1–1.0)
Monocytes Absolute: 0.6 10*3/uL (ref 0.1–1.0)
Monocytes Relative: 7 % (ref 3–12)
Monocytes Relative: 7 % (ref 3–12)
Monocytes Relative: 7 % (ref 3–12)
Neutro Abs: 2.6 10*3/uL (ref 1.7–7.7)
Neutro Abs: 3.4 10*3/uL (ref 1.7–7.7)
Neutro Abs: 4.4 10*3/uL (ref 1.7–7.7)
Neutrophils Relative %: 37 % — ABNORMAL LOW (ref 43–77)
Neutrophils Relative %: 64 % (ref 43–77)

## 2010-12-13 LAB — CARDIAC PANEL(CRET KIN+CKTOT+MB+TROPI)
CK, MB: 1.2 ng/mL (ref 0.3–4.0)
Relative Index: 0.7 (ref 0.0–2.5)
Relative Index: 0.9 (ref 0.0–2.5)
Total CK: 107 U/L (ref 7–177)
Total CK: 167 U/L (ref 7–177)
Troponin I: 0.01 ng/mL (ref 0.00–0.06)
Troponin I: <0.01 ng/mL (ref 0.00–0.06)

## 2010-12-13 LAB — URINALYSIS, ROUTINE W REFLEX MICROSCOPIC
Bilirubin Urine: NEGATIVE
Glucose, UA: NEGATIVE mg/dL
Ketones, ur: NEGATIVE mg/dL
Nitrite: NEGATIVE
Protein, ur: NEGATIVE mg/dL

## 2010-12-13 LAB — CBC
HCT: 28.9 % — ABNORMAL LOW (ref 36.0–46.0)
HCT: 29.5 % — ABNORMAL LOW (ref 36.0–46.0)
HCT: 30.6 % — ABNORMAL LOW (ref 36.0–46.0)
Hemoglobin: 8.6 g/dL — ABNORMAL LOW (ref 12.0–15.0)
Hemoglobin: 9.1 g/dL — ABNORMAL LOW (ref 12.0–15.0)
MCHC: 31.5 g/dL (ref 30.0–36.0)
MCHC: 31.5 g/dL (ref 30.0–36.0)
MCHC: 31.7 g/dL (ref 30.0–36.0)
MCV: 72.5 fL — ABNORMAL LOW (ref 78.0–100.0)
Platelets: 260 10*3/uL (ref 150–400)
Platelets: 270 10*3/uL (ref 150–400)
RBC: 3.76 MIL/uL — ABNORMAL LOW (ref 3.87–5.11)
RBC: 3.98 MIL/uL (ref 3.87–5.11)
RDW: 14.4 % (ref 11.5–15.5)
RDW: 14.8 % (ref 11.5–15.5)
WBC: 7 10*3/uL (ref 4.0–10.5)

## 2010-12-13 LAB — GLUCOSE, CAPILLARY
Glucose-Capillary: 100 mg/dL — ABNORMAL HIGH (ref 70–99)
Glucose-Capillary: 102 mg/dL — ABNORMAL HIGH (ref 70–99)
Glucose-Capillary: 118 mg/dL — ABNORMAL HIGH (ref 70–99)
Glucose-Capillary: 125 mg/dL — ABNORMAL HIGH (ref 70–99)
Glucose-Capillary: 136 mg/dL — ABNORMAL HIGH (ref 70–99)
Glucose-Capillary: 169 mg/dL — ABNORMAL HIGH (ref 70–99)

## 2010-12-13 LAB — BASIC METABOLIC PANEL
BUN: 30 mg/dL — ABNORMAL HIGH (ref 6–23)
CO2: 30 mEq/L (ref 19–32)
Calcium: 9.1 mg/dL (ref 8.4–10.5)
GFR calc Af Amer: 52 mL/min — ABNORMAL LOW (ref >60)
GFR calc non Af Amer: 43 mL/min — ABNORMAL LOW (ref >60)
GFR calc non Af Amer: >60 mL/min (ref >60)
Glucose, Bld: 130 mg/dL — ABNORMAL HIGH (ref 70–99)
Potassium: 4.8 mEq/L (ref 3.5–5.1)
Potassium: 4.8 mEq/L (ref 3.5–5.1)
Potassium: 5.2 mEq/L — ABNORMAL HIGH (ref 3.5–5.1)
Sodium: 137 mEq/L (ref 135–145)

## 2010-12-13 LAB — COMPREHENSIVE METABOLIC PANEL
Albumin: 3.5 g/dL (ref 3.5–5.2)
Alkaline Phosphatase: 84 U/L (ref 39–117)
BUN: 19 mg/dL (ref 6–23)
Calcium: 9.3 mg/dL (ref 8.4–10.5)
Potassium: 4.3 mEq/L (ref 3.5–5.1)
Total Protein: 7.1 g/dL (ref 6.0–8.3)

## 2010-12-13 LAB — HOMOCYSTEINE: Homocysteine: 12.5 umol/L (ref 4.0–15.4)

## 2010-12-13 LAB — IFOBT (OCCULT BLOOD): IFOBT: NEGATIVE

## 2010-12-13 LAB — POCT CARDIAC MARKERS: Myoglobin, poc: 67.8 ng/mL (ref 12–200)

## 2010-12-13 LAB — VITAMIN D 1,25 DIHYDROXY: Vit D, 1,25-Dihydroxy: 21 pg/mL (ref 15–75)

## 2010-12-13 LAB — LIPID PANEL
Cholesterol: 141 mg/dL (ref 0–200)
LDL Cholesterol: 63 mg/dL (ref 0–99)
Triglycerides: 148 mg/dL (ref <150)
VLDL: 30 mg/dL (ref 0–40)

## 2010-12-13 LAB — SEDIMENTATION RATE: Sed Rate: 20 mm/hr (ref 0–22)

## 2010-12-13 LAB — VITAMIN B12: Vitamin B-12: 483 pg/mL (ref 211–911)

## 2010-12-13 LAB — B-NATRIURETIC PEPTIDE (CONVERTED LAB): Pro B Natriuretic peptide (BNP): 91.5 pg/mL (ref 0.0–100.0)

## 2010-12-13 LAB — TSH: TSH: 1.041 u[IU]/mL (ref 0.350–4.500)

## 2010-12-15 NOTE — Progress Notes (Signed)
Quick Note:  If not too much trouble, can we reorder UA. ______

## 2010-12-15 NOTE — Progress Notes (Signed)
Quick Note:  ifobt negative. Hgb improved. Hold off on capsule study for now.   Need to set up for EGD/ED with Dr. Jena Gauss in near future. Needs to be done in OR due to polypharmacy. Complicated hx with most recent EGD/ED in Aug 2011. Failed manometry at [facility]. . Likely after dilation, will need referral again to [facility]. ______

## 2010-12-16 ENCOUNTER — Encounter: Payer: Self-pay | Admitting: Family Medicine

## 2010-12-19 ENCOUNTER — Telehealth: Payer: Self-pay

## 2010-12-19 ENCOUNTER — Telehealth: Payer: Self-pay | Admitting: Gastroenterology

## 2010-12-19 ENCOUNTER — Other Ambulatory Visit: Payer: Self-pay | Admitting: Gastroenterology

## 2010-12-19 NOTE — Progress Notes (Signed)
PT IS SCHEDULED FOR EGD/ED ON 12/29/10- INSTRUCTIONS MAILED

## 2010-12-19 NOTE — Telephone Encounter (Signed)
Pt called- she continues to have abd pain and nausea. Pt said she wants [name] to refer her to have her pancreas checked. She said the last time it was done was 5 years ago and her sister died from pancreatic cancer and she is concerned. Pt is aware she is scheduled for egd with RMR.

## 2010-12-19 NOTE — Telephone Encounter (Signed)
On the CT in Jan, no issues with pancreas. Where is her abdominal pain located? She has hx of gastroparesis and chronic abdominal pain. We need to continue current plan. Lipase has been normal dating back to 2009.

## 2010-12-19 NOTE — Telephone Encounter (Signed)
PT SCHEDULED FOR EGD/ED IN THE OR ON 12/29/10- PT AWARE AND INSTRUCTIONS MAILED

## 2010-12-20 NOTE — Telephone Encounter (Signed)
If constipated, may take Miralax 17 grams daily as needed for BM. Ensure keeping down liquids, foods. LFTs in Sept normal. Make sure on PPI If no Probiotic, offer #14 Align or Restora. If continued pain, can draw lipase.

## 2010-12-20 NOTE — Telephone Encounter (Signed)
Pt said it started in her middle abd and moved around to her back.

## 2010-12-21 NOTE — Telephone Encounter (Signed)
Tried to call [name] 

## 2010-12-22 IMAGING — CT CT HEAD W/O CM
1 of 2 series · 13 of 30 positions shown, 17 images · non-contrast
Comparison: 08/03/2008

CLINICAL DATA: Fell, altered level of consciousness

CT HEAD WITHOUT CONTRAST
TECHNIQUE: Contiguous axial images were obtained from the base of
the skull through the vertex without contrast.

[Series 2: headtrauma 4.8 h37s · axial · 0.47mm/px · z∈[+1037,+1182]mm · 13 of 36 slices shown, 17 images]
[im 3/36  brain]
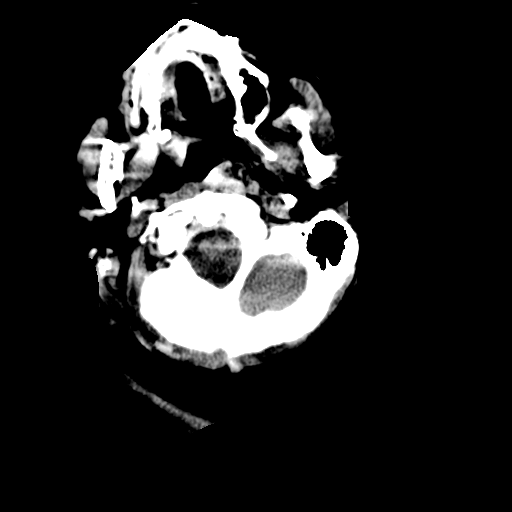
[im 3/36  bone]
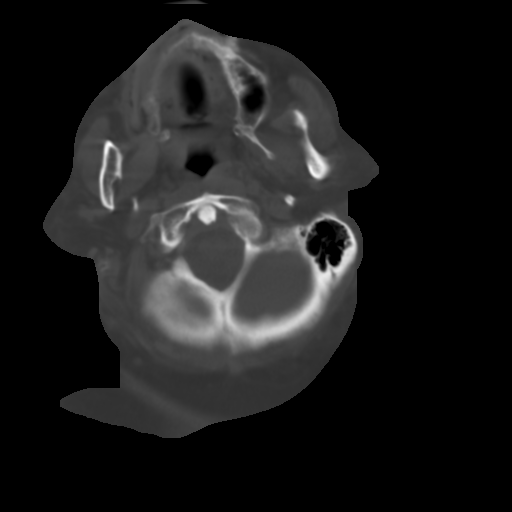
[im 6/36  brain]
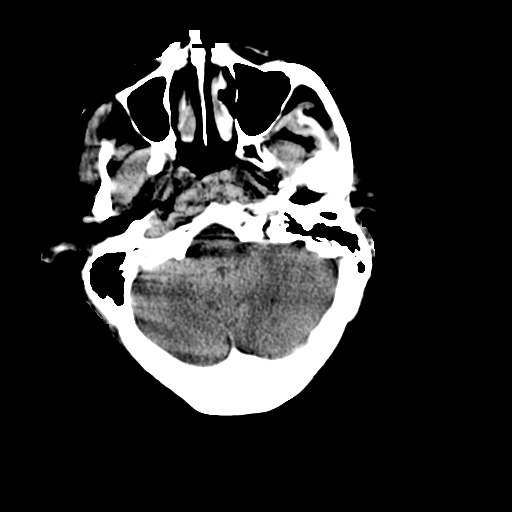
[im 8/36  brain]
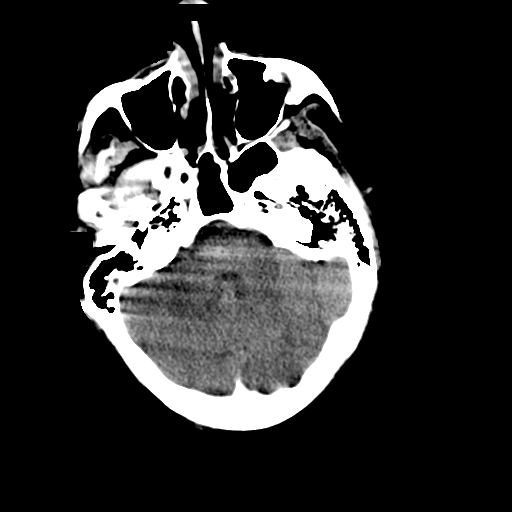
[im 11/36  brain]
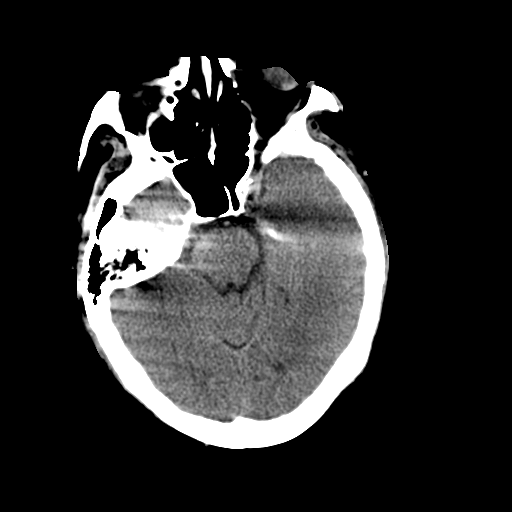
[im 13/36  brain]
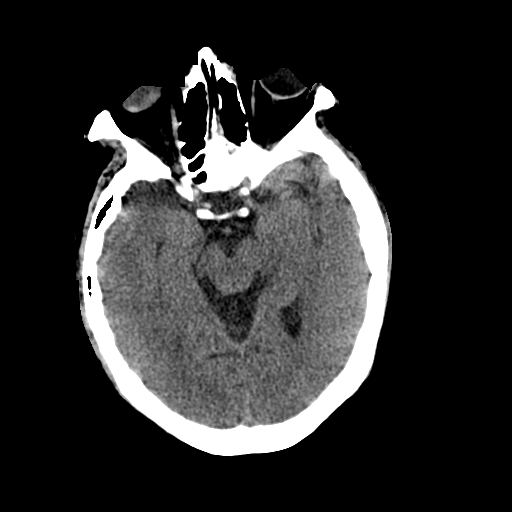
[im 13/36  bone]
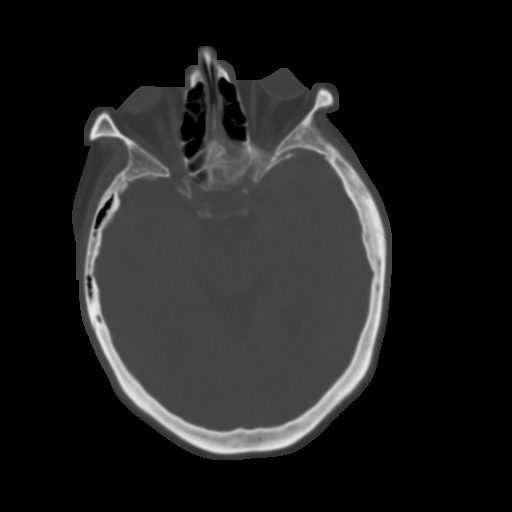
[im 16/36  brain]
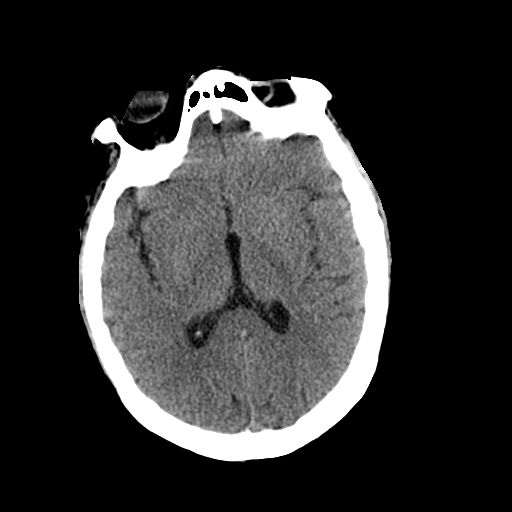
[im 18/36  brain]
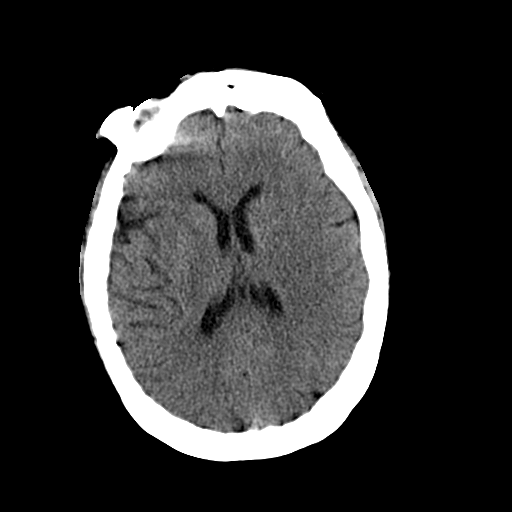
[im 21/36  brain]
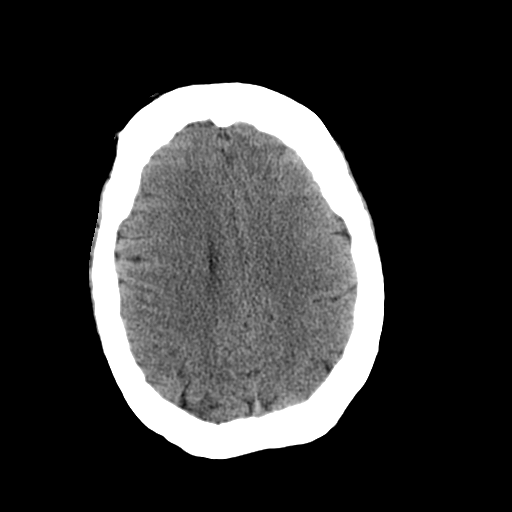
[im 23/36  brain]
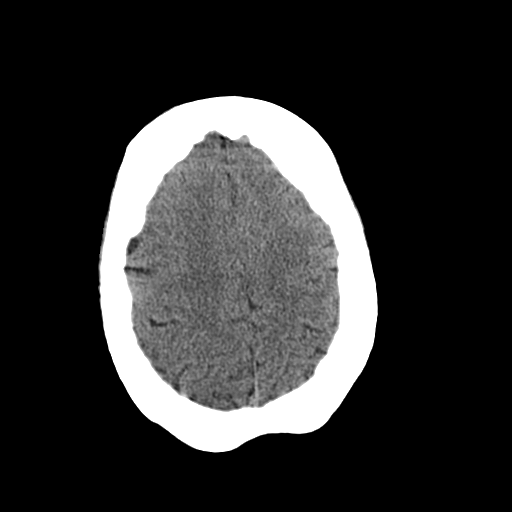
[im 23/36  bone]
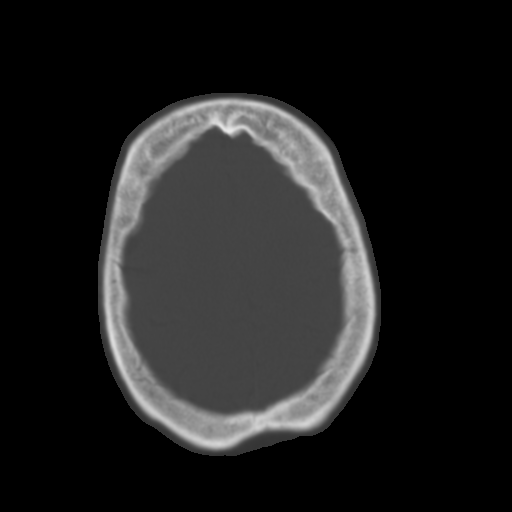
[im 26/36  brain]
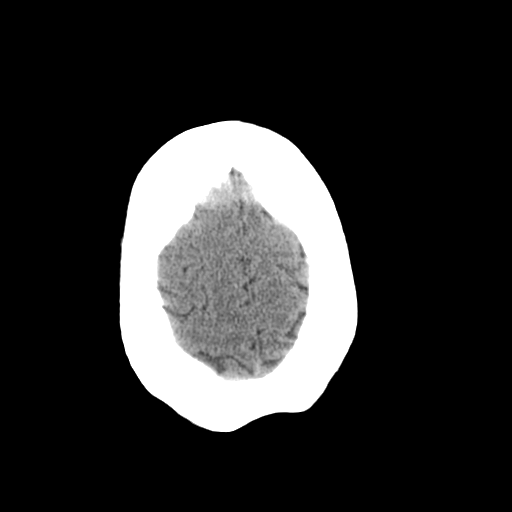
[im 28/36  brain]
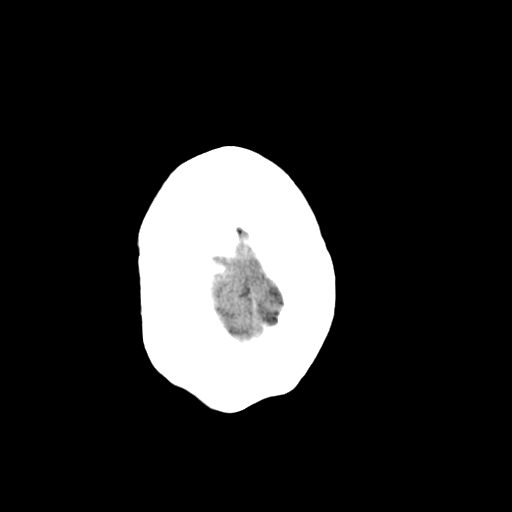
[im 31/36  brain]
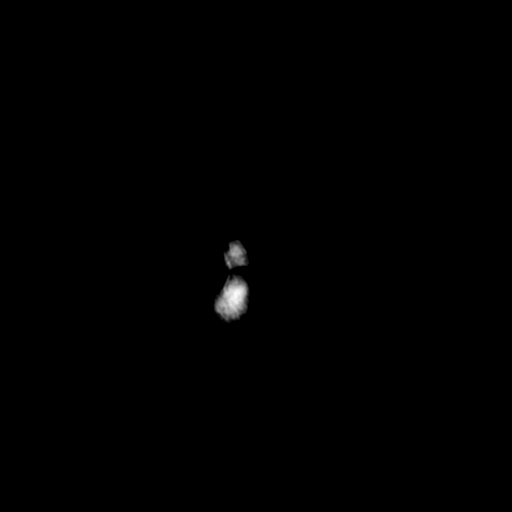
[im 33/36  brain]
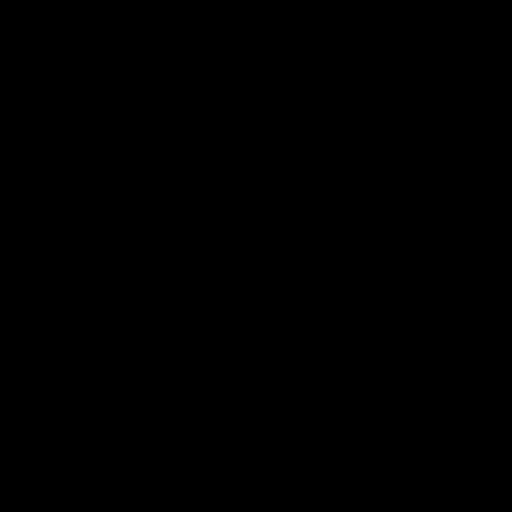
[im 33/36  bone]
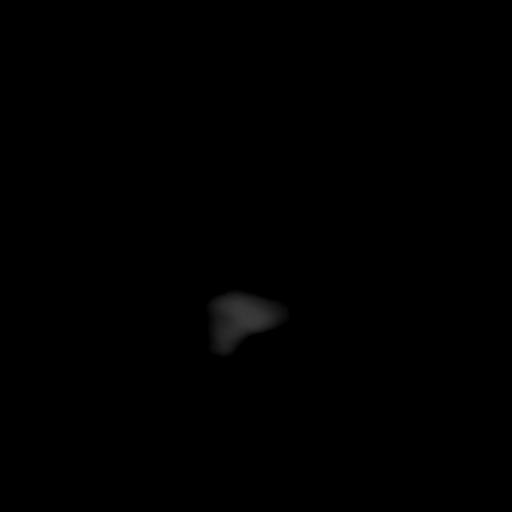

[13 of 30 positions shown; findings below may reference images not displayed]

FINDINGS: There is no evidence of acute intracranial hemorrhage,
brain edema, mass lesion, acute infarction,   mass effect, or
midline shift. Acute infarct may be inapparent on noncontrast CT.
No other intra-axial abnormalities are seen, and the ventricles and
sulci are within normal limits in size and symmetry.   No abnormal
extra-axial fluid collections or masses are identified.  No
significant calvarial abnormality.
IMPRESSION: Negative for bleed or other acute intracranial process.

## 2010-12-22 IMAGING — CR DG CHEST 1V
1 series · 1 of 1 positions shown · non-contrast
Comparison: Chest x-ray of 03/12/2009

CLINICAL DATA: Recent fall, altered level of consciousness

CHEST - 1 VIEW

[view not recorded]
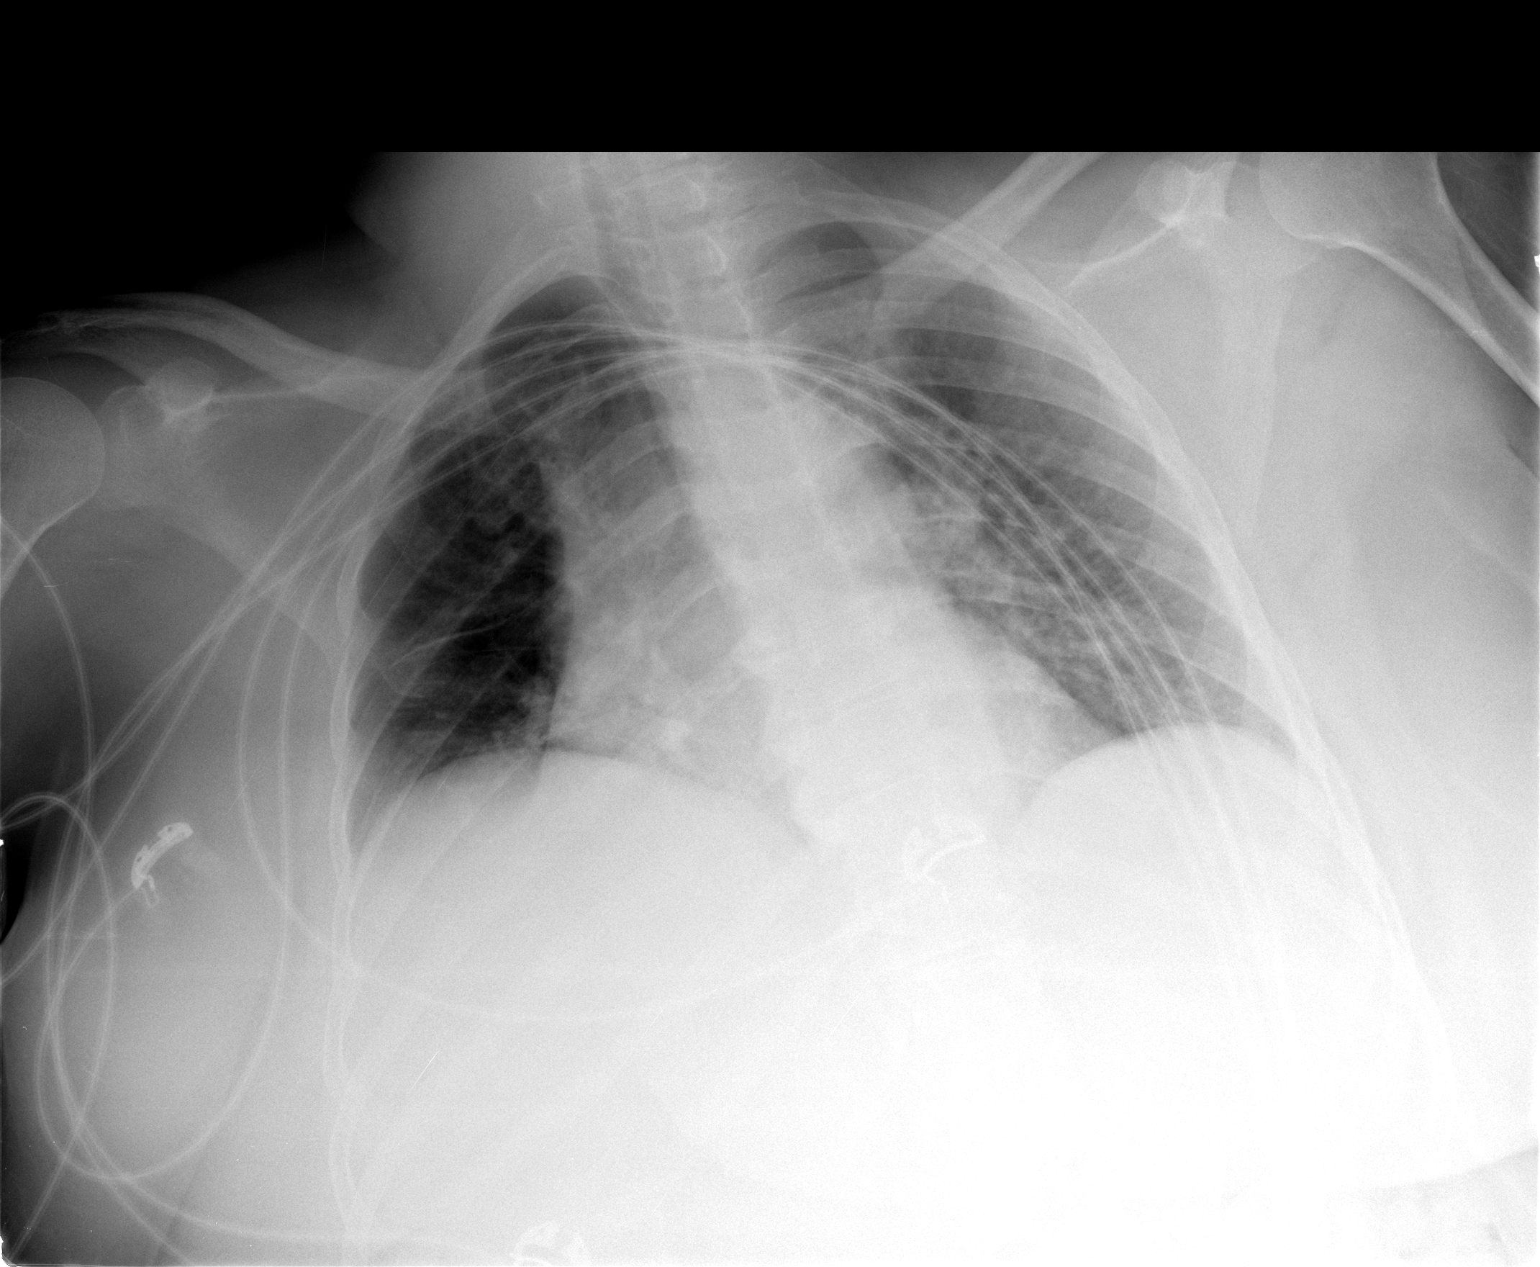

[1 of 1 positions shown; findings below may reference images not displayed]

FINDINGS: The lungs are not well aerated.  No focal infiltrate or
effusion is seen.  Cardiomegaly is stable.  No acute bony
abnormality is seen.
IMPRESSION: Poor aeration.  No active lung disease.  Stable cardiomegaly.

## 2010-12-22 NOTE — Telephone Encounter (Signed)
Pt aware, she is not taking a probiotic. #14 samples of restora at front desk.

## 2010-12-22 NOTE — Telephone Encounter (Signed)
Tried to call pt- LMOM 

## 2010-12-23 ENCOUNTER — Encounter (HOSPITAL_COMMUNITY): Payer: Self-pay

## 2010-12-23 ENCOUNTER — Other Ambulatory Visit: Payer: Self-pay

## 2010-12-23 ENCOUNTER — Encounter (HOSPITAL_COMMUNITY)
Admission: RE | Admit: 2010-12-23 | Discharge: 2010-12-23 | Disposition: A | Discharge status: Home / Self Care | Payer: Medicare Other | Source: Ambulatory Visit | Attending: Internal Medicine | Admitting: Internal Medicine

## 2010-12-23 HISTORY — DX: Major depressive disorder, single episode, unspecified: F32.9

## 2010-12-23 HISTORY — DX: Anemia, unspecified: D64.9

## 2010-12-23 HISTORY — DX: Hypothyroidism, unspecified: E03.9

## 2010-12-23 HISTORY — DX: Encounter for other specified aftercare: Z51.89

## 2010-12-23 HISTORY — DX: Cerebral infarction, unspecified: I63.9

## 2010-12-23 HISTORY — DX: Gastro-esophageal reflux disease without esophagitis: K21.9

## 2010-12-23 HISTORY — DX: Unspecified convulsions: R56.9

## 2010-12-23 HISTORY — DX: Anxiety disorder, unspecified: F41.9

## 2010-12-23 HISTORY — DX: Reserved for inherently not codable concepts without codable children: IMO0001

## 2010-12-23 LAB — BASIC METABOLIC PANEL
CO2: 32 mEq/L (ref 19–32)
Calcium: 9.3 mg/dL (ref 8.4–10.5)
Glucose, Bld: 156 mg/dL — ABNORMAL HIGH (ref 70–99)
Sodium: 141 mEq/L (ref 135–145)

## 2010-12-23 LAB — CBC
Hemoglobin: 11.3 g/dL — ABNORMAL LOW (ref 12.0–15.0)
MCH: 22.5 pg — ABNORMAL LOW (ref 26.0–34.0)
MCV: 74.6 fL — ABNORMAL LOW (ref 78.0–100.0)
RBC: 5.03 MIL/uL (ref 3.87–5.11)

## 2010-12-23 NOTE — Patient Instructions (Addendum)
20 [name]  12/23/2010   Your procedure is scheduled on:  12/29/2010  Report to [facility] at  1130 AM.  Call this number if you have problems the morning of surgery: [phone]   Remember:   Do not eat food:After Midnight.  Do not drink clear liquids: After Midnight.  Take these medicines the morning of surgery with A SIP OF WATER: aciphex,endocet,flexaril,ativan,singulair,catapress,levoxyl,antivert,lopressor,zoloft,lyrica,navane.   Do not wear jewelry, make-up or nail polish.  Do not wear lotions, powders, or perfumes. You may wear deodorant.  Do not shave 48 hours prior to surgery.  Do not bring valuables to the hospital.  Contacts, dentures or bridgework may not be worn into surgery.  Leave suitcase in the car. After surgery it may be brought to your room.  For patients admitted to the hospital, checkout time is 11:00 AM the day of discharge.   Patients discharged the day of surgery will not be allowed to drive home.  Name and phone number of your driver: family  Special Instructions: N/A   Please read over the following fact sheets that you were given: Pain Booklet, Surgical Site Infection Prevention, Anesthesia Post-op Instructions and Care and Recovery After Surgery PATIENT INSTRUCTIONS POST-ANESTHESIA  IMMEDIATELY FOLLOWING SURGERY:  Do not drive or operate machinery for the first twenty four hours after surgery.  Do not make any important decisions for twenty four hours after surgery or while taking narcotic pain medications or sedatives.  If you develop intractable nausea and vomiting or a severe headache please notify your doctor immediately.  FOLLOW-UP:  Please make an appointment with your surgeon as instructed. You do not need to follow up with anesthesia unless specifically instructed to do so.  WOUND CARE INSTRUCTIONS (if applicable):  Keep a dry clean dressing on the anesthesia/puncture wound site if there is drainage.  Once the wound has quit draining you may  leave it open to air.  Generally you should leave the bandage intact for twenty four hours unless there is drainage.  If the epidural site drains for more than 36-48 hours please call the anesthesia department.  QUESTIONS?:  Please feel free to call your physician or the hospital operator if you have any questions, and they will be happy to assist you.     [facility] Anesthesia Department [address] [place] [phone]

## 2010-12-29 ENCOUNTER — Encounter (HOSPITAL_COMMUNITY): Payer: Self-pay | Admitting: *Deleted

## 2010-12-29 ENCOUNTER — Encounter (HOSPITAL_COMMUNITY): Payer: Self-pay | Admitting: Anesthesiology

## 2010-12-29 ENCOUNTER — Encounter (HOSPITAL_COMMUNITY)
Admission: RE | Disposition: A | Discharge status: Home / Self Care | Payer: Self-pay | Source: Ambulatory Visit | Attending: Internal Medicine

## 2010-12-29 ENCOUNTER — Ambulatory Visit (HOSPITAL_COMMUNITY): Payer: Medicare Other | Admitting: Anesthesiology

## 2010-12-29 ENCOUNTER — Ambulatory Visit (HOSPITAL_COMMUNITY)
Admission: RE | Admit: 2010-12-29 | Discharge: 2010-12-29 | Disposition: A | Discharge status: Home / Self Care | Payer: Medicare Other | Source: Ambulatory Visit | Attending: Internal Medicine | Admitting: Internal Medicine

## 2010-12-29 DIAGNOSIS — R1310 Dysphagia, unspecified: Secondary | ICD-10-CM | POA: Insufficient documentation

## 2010-12-29 DIAGNOSIS — Z794 Long term (current) use of insulin: Secondary | ICD-10-CM | POA: Insufficient documentation

## 2010-12-29 DIAGNOSIS — E119 Type 2 diabetes mellitus without complications: Secondary | ICD-10-CM | POA: Insufficient documentation

## 2010-12-29 DIAGNOSIS — Z01812 Encounter for preprocedural laboratory examination: Secondary | ICD-10-CM | POA: Insufficient documentation

## 2010-12-29 DIAGNOSIS — Z01810 Encounter for preprocedural cardiovascular examination: Secondary | ICD-10-CM | POA: Insufficient documentation

## 2010-12-29 DIAGNOSIS — I10 Essential (primary) hypertension: Secondary | ICD-10-CM | POA: Insufficient documentation

## 2010-12-29 DIAGNOSIS — R933 Abnormal findings on diagnostic imaging of other parts of digestive tract: Secondary | ICD-10-CM

## 2010-12-29 DIAGNOSIS — K449 Diaphragmatic hernia without obstruction or gangrene: Secondary | ICD-10-CM

## 2010-12-29 DIAGNOSIS — G4733 Obstructive sleep apnea (adult) (pediatric): Secondary | ICD-10-CM | POA: Insufficient documentation

## 2010-12-29 DIAGNOSIS — Z79899 Other long term (current) drug therapy: Secondary | ICD-10-CM | POA: Insufficient documentation

## 2010-12-29 HISTORY — PX: ESOPHAGOGASTRODUODENOSCOPY: SHX1529

## 2010-12-29 HISTORY — PX: MALONEY DILATION: SHX5535

## 2010-12-29 LAB — GLUCOSE, CAPILLARY

## 2010-12-29 SURGERY — ESOPHAGOGASTRODUODENOSCOPY (EGD) WITH PROPOFOL
Anesthesia: Monitor Anesthesia Care | Site: Mouth

## 2010-12-29 MED ORDER — PROPOFOL 10 MG/ML IV EMUL
INTRAVENOUS | Status: DC | PRN
  Administered 2010-12-29: 50 ug/kg/min via INTRAVENOUS

## 2010-12-29 MED ORDER — MIDAZOLAM HCL 5 MG/5ML IJ SOLN
INTRAMUSCULAR | Status: DC | PRN
  Administered 2010-12-29: 2 mg via INTRAVENOUS

## 2010-12-29 MED ORDER — BUTAMBEN-TETRACAINE-BENZOCAINE 2-2-14 % EX AERO
1.0000 | INHALATION_SPRAY | Freq: Once | CUTANEOUS | Status: DC
  Filled 2010-12-29: qty 56

## 2010-12-29 MED ORDER — FENTANYL CITRATE 0.05 MG/ML IJ SOLN: 25.0000 ug | INTRAMUSCULAR | Status: DC | PRN

## 2010-12-29 MED ORDER — STERILE WATER FOR IRRIGATION IR SOLN
Status: DC | PRN
  Administered 2010-12-29: 12:00:00

## 2010-12-29 MED ORDER — BUTAMBEN-TETRACAINE-BENZOCAINE 2-2-14 % EX AERO
1.0000 | INHALATION_SPRAY | Freq: Once | CUTANEOUS | Status: AC
  Administered 2010-12-29: 1 via TOPICAL
  Filled 2010-12-29: qty 56

## 2010-12-29 MED ORDER — MIDAZOLAM HCL 2 MG/2ML IJ SOLN
INTRAMUSCULAR | Status: AC
  Filled 2010-12-29: qty 2

## 2010-12-29 MED ORDER — MIDAZOLAM HCL 2 MG/2ML IJ SOLN
1.0000 mg | INTRAMUSCULAR | Status: DC | PRN
  Administered 2010-12-29 (×2): 2 mg via INTRAVENOUS

## 2010-12-29 MED ORDER — ONDANSETRON HCL 4 MG/2ML IJ SOLN: 4.0000 mg | Freq: Once | INTRAMUSCULAR | Status: DC | PRN

## 2010-12-29 MED ORDER — DEXTROSE 50 % IV SOLN
INTRAVENOUS | Status: AC
  Administered 2010-12-29: 25 mL
  Filled 2010-12-29: qty 50

## 2010-12-29 MED ORDER — GLYCOPYRROLATE 0.2 MG/ML IJ SOLN
INTRAMUSCULAR | Status: AC
  Filled 2010-12-29: qty 1

## 2010-12-29 MED ORDER — ONDANSETRON HCL 4 MG/2ML IJ SOLN
INTRAMUSCULAR | Status: AC
  Filled 2010-12-29: qty 2

## 2010-12-29 MED ORDER — GLYCOPYRROLATE 0.2 MG/ML IJ SOLN
0.2000 mg | Freq: Once | INTRAMUSCULAR | Status: AC
  Administered 2010-12-29: 0.2 mg via INTRAVENOUS

## 2010-12-29 MED ORDER — LACTATED RINGERS IV SOLN
INTRAVENOUS | Status: DC
  Administered 2010-12-29: 11:00:00 via INTRAVENOUS

## 2010-12-29 MED ORDER — LIDOCAINE HCL 1 % IJ SOLN
INTRAMUSCULAR | Status: DC | PRN
  Administered 2010-12-29: 25 mg via INTRADERMAL

## 2010-12-29 MED ORDER — ONDANSETRON HCL 4 MG/2ML IJ SOLN
4.0000 mg | Freq: Once | INTRAMUSCULAR | Status: AC
  Administered 2010-12-29: 4 mg via INTRAVENOUS

## 2010-12-29 MED ORDER — DEXTROSE 50 % IV SOLN
12.5000 g | Freq: Once | INTRAVENOUS | Status: AC
  Administered 2010-12-29: 12.5 g via INTRAVENOUS

## 2010-12-29 SURGICAL SUPPLY — 20 items
BLOCK BITE 60FR ADLT L/F BLUE (MISCELLANEOUS) ×3 IMPLANT
DEVICE CLIP HEMOSTAT 235CM (CLIP) IMPLANT
ELECT REM PT RETURN 9FT ADLT (ELECTROSURGICAL)
ELECTRODE REM PT RTRN 9FT ADLT (ELECTROSURGICAL) IMPLANT
FLOOR PAD 36X40 (MISCELLANEOUS) ×3
FORCEP RJ3 GP 1.8X160 W-NEEDLE (CUTTING FORCEPS) ×3 IMPLANT
FORCEPS BIOP RAD 4 LRG CAP 4 (CUTTING FORCEPS) ×2 IMPLANT
MANIFOLD NEPTUNE WASTE (CANNULA) ×3 IMPLANT
NDL SCLEROTHERAPY 25GX240 (NEEDLE) ×2 IMPLANT
NEEDLE SCLEROTHERAPY 25GX240 (NEEDLE) ×3 IMPLANT
PAD FLOOR 36X40 (MISCELLANEOUS) ×2 IMPLANT
PROBE APC STR FIRE (PROBE) ×3 IMPLANT
PROBE INJECTION GOLD (MISCELLANEOUS) ×3
PROBE INJECTION GOLD 7FR (MISCELLANEOUS) ×2 IMPLANT
SNARE ROTATE MED OVAL 20MM (MISCELLANEOUS) ×3 IMPLANT
SYR 50ML LL SCALE MARK (SYRINGE) IMPLANT
TUBING ENDO SMARTCAP (MISCELLANEOUS) ×3 IMPLANT
TUBING ENDO SMARTCAP PENTAX (MISCELLANEOUS) ×3 IMPLANT
TUBING IRRIGATION ENDOGATOR (MISCELLANEOUS) ×3 IMPLANT
WATER STERILE IRR 1000ML POUR (IV SOLUTION) ×1 IMPLANT

## 2010-12-29 NOTE — Anesthesia Preprocedure Evaluation (Addendum)
Anesthesia Evaluation  Name, MR# and DOB Patient awake  General Assessment Comment  Reviewed: Allergy & Precautions, H&P , NPO status , Patient's Chart, lab work & pertinent test results  History of Anesthesia Complications Negative for: history of anesthetic complications  Airway Mallampati: III    Mouth opening: Limited Mouth Opening  Dental  (+) Edentulous Upper and Edentulous Lower   Pulmonary sleep apnea and Continuous Positive Airway Pressure Ventilation COPDformer smoker   + decreased breath sounds      Cardiovascular hypertension, Pt. on medications Regular Normal    Neuro/Psych  Headaches, Seizures -, Well Controlled,  PSYCHIATRIC DISORDERS Bipolar Disorder CVA (left weakness residual-stable), Residual Symptoms    GI/Hepatic GERD (dysphagia) Medicated  Endo/Other  Diabetes mellitus-, Well Controlled, Type 2, Oral Hypoglycemic AgentsHypothyroidism   Renal/GU      Musculoskeletal   Abdominal   Peds  Hematology   Anesthesia Other Findings   Reproductive/Obstetrics                           Anesthesia Physical Anesthesia Plan  ASA: III  Anesthesia Plan: MAC   Post-op Pain Management:    Induction:   Airway Management Planned: Simple Face Mask  Additional Equipment:   Intra-op Plan:   Post-operative Plan:   Informed Consent: I have reviewed the patients History and Physical, chart, labs and discussed the procedure including the risks, benefits and alternatives for the proposed anesthesia with the patient or authorized representative who has indicated his/her understanding and acceptance.     Plan Discussed with:   Anesthesia Plan Comments:         Anesthesia Quick Evaluation

## 2010-12-29 NOTE — Transfer of Care (Signed)
Immediate Anesthesia Transfer of Care Note  Patient: [name]  Procedure(s) Performed:  ESOPHAGOGASTRODUODENOSCOPY (EGD) WITH PROPOFOL; MALONEY DILATION - 56, 58,  Patient Location: PACU  Anesthesia Type: MAC  Level of Consciousness: awake and patient cooperative  Airway & Oxygen Therapy: Patient Spontanous Breathing and Patient connected to face mask oxygen  Post-op Assessment: Report given to PACU RN, Post -op Vital signs reviewed and stable, Patient moving all extremities and Patient moving all extremities X 4  Post vital signs: Reviewed and stable  Complications: No apparent anesthesia complications

## 2010-12-29 NOTE — Addendum Note (Signed)
Addendum  created 12/29/10 1216 by Laurene Footman   Modules edited:Anesthesia Attestations

## 2010-12-29 NOTE — Anesthesia Postprocedure Evaluation (Signed)
  Anesthesia Post-op Note  Patient: [name]  Procedure(s) Performed:  ESOPHAGOGASTRODUODENOSCOPY (EGD) WITH PROPOFOL; MALONEY DILATION - 56, 58,  Patient Location: PACU  Anesthesia Type: MAC  Level of Consciousness: awake and patient cooperative  Airway and Oxygen Therapy: Patient Spontanous Breathing  Post-op Pain: none  Post-op Assessment: Post-op Vital signs reviewed, Patient's Cardiovascular Status Stable, Respiratory Function Stable, Patent Airway and No signs of Nausea or vomiting  Post-op Vital Signs: Reviewed and stable  Complications: No apparent anesthesia complications

## 2010-12-29 NOTE — H&P (Signed)
Gerrit Halls, NP  12/04/2010 10:40 PM  Signed   Referring Provider: Syliva Overman, MD Primary Care Physician:  Syliva Overman, MD, MD Primary Gastroenterologist: Dr. Jena Gauss     Chief Complaint   Patient presents with   .  Abdominal Pain      HPI:    Ms. Stahlman is a 60 year old female with multiple comorbidities, followed by [name] for gastroparesis, GERD, IBS, chronic anemia, and dysphagia. Has hx of chronic abdominal pain. Reports occasional constipation, no melena or brbpr. States has pale green stool. Points to lower abdomen, almost suprapubic area, stating crampy abdominal pain, not better after BM.   Still complains of dysphagia.      03/25/2009: BPE: 13 mm pill lodged, unable to pass, esophageal dysmotility, failed manometry at [facility]. MBSS 04/06/09: minimal oral and swallowing dysfunction GES 04/2009: poor gastric emptying, started on Reglan EGD 04/2009: normal, retained food, small H.H. EGD/ED/TCS 10/2009: non-critical Schatzki's ring, s/p dilation, left-sided diverticula   03/30/2010: CT benign findings for abdominal etiology, [name] benign    Lab Summary  Latest Ref Rng  11/24/2010  10/04/2010  10/03/2010   Hemoglobin  12.0 - 15.0 g/dL  (None)  9.3 (L)  9.6 (L)   Hematocrit  36.0 - 46.0 %  (None)  29.5 (L)  30.3 (L)   White count  4.0 - 10.5 K/uL  (None)  6.3  7.0   Platelet count  150 - 400 K/uL  (None)  207  179   Sodium  135 - 145 mEq/L  143  (None)  140   Potassium  3.5 - 5.3 mEq/L  4.2  (None)  3.5   Calcium  8.4 - 10.5 mg/dL  9.5  (None)  8.4   Phosphorus    (None)  (None)  (None)   Creatinine  0.50 - 1.10 mg/dL  1.61  (None)  <0.96 (L)   AST  0 - 37 U/L  22  (None)  (None)   Alk Phos  39 - 117 U/L  117  (None)  (None)   Bilirubin  0.3 - 1.2 mg/dL  0.3  (None)  (None)   Glucose  70 - 99 mg/dL  045 (H)  (None)  409 (H)   Cholesterol  0 - 200 mg/dL  811  (None)  (None)   HDL cholesterol  >39 mg/dL  48  (None)  (None)   Triglycerides  <150 mg/dL  914 (H)  (None)  (None)     LDL calc    (None)  (None)  (None)   LDL direct    (None)  (None)  (None)   Total protein  6.0 - 8.3 g/dL  7.4  (None)  (None)   Albumin  3.5 - 5.2 g/dL  4.0  (None)  (None)          Levbid, Bentyl not on formulary.   Past Medical History   Diagnosis  Date   .  Bipolar disorder     .  CVA (cerebral infarction)     .  Pancreatitis  2006       due to Depakote with normal EUS    .  Osteoporosis     .  Sleep apnea     .  Chronic back pain     .  Diabetes mellitus     .  Trigger finger     .  Anxiety disorder     .  Hypertension     .  Migraines     .  Diverticulosis         TCS 9/08 by Dr. Lina Sar for diarrhea . Bx for micro scopic colitis .    [name]  Schatzki's ring         non critical / EGD with ED 8/2011with RMR   .  S/P colonoscopy  1610,9604, 2011       left-sided diverticula, hx of simple adenomas    .  Glaucoma         Past Surgical History   Procedure  Date   .  Abdominal hysterectomy     .  Cholecystectomy     .  Ovarian cyst removal     .  Carpal tunnel release  07/22/04       left/ Dr. Romeo Apple    .  Breast reduction surgery     .  Bilateral cataract surgery     .  Biopsy of thyroid gland  2009   .  Surgical excision of 3 tumors from right thigh and right buttock  and left upper thigh  2010   .  Back surgery  July 2012       Current Outpatient Prescriptions   Medication  Sig  Dispense  Refill   .  ACIPHEX 20 MG tablet  TAKE 1 TABLET BY MOUTH   ONCE DAILY BEFORE A MEAL.   31 each   11   .  BD INSULIN SYRINGE ULTRAFINE 31G X 5/16" 0.3 ML MISC  USE AS DIRECTED.   100 each   3   .  cloNIDine (CATAPRES) 0.3 MG tablet  TAKE 1 TABLET BY MOUTH 3 TIMES DAILY: TAKE AT 8A.M.;4 P.M.; AND         MIDNIGHT.   90 tablet   4   .  CRESTOR 10 MG tablet  TAKE 1 TABLET BY MOUTH ATBEDTIME.   30 each   4   .  Diphenhyd-Hydrocort-Nystatin (FIRST-DUKES MOUTHWASH) SUSP  Use as directed in the mouth or throat. Take 2 teaspoons three times daily as needed for hoarseness           .  diphenoxylate-atropine (LOMOTIL) 2.5-0.025 MG per tablet  Take 1 tablet by mouth.           .  ergocalciferol (VITAMIN D2) 50000 UNITS capsule  Take 50,000 Units by mouth once a week.           [name]  EVOXAC 30 MG capsule  TAKE (1) CAPSULE BY MOUTHTHREE TIMES A DAY.   90 each   4   .  FLEXERIL 10 MG tablet  TAKE 1 TABLET BY MOUTH   TWICE DAILY.   60 each   4   .  furosemide (LASIX) 20 MG tablet  Take 20 mg by mouth daily as needed.           [name]  glipiZIDE (GLUCOTROL) 10 MG tablet  TAKE 1 TABLET BY MOUTH   TWICE DAILY.   60 tablet   4   .  glucose blood test strip  Twice daily testing   100 each   5   .  hydrochlorothiazide (,MICROZIDE/HYDRODIURIL,) 12.5 MG capsule  TAKE ONE CAPSULE DAILY.   30 capsule   4   .  HYDROcodone-acetaminophen (NORCO) 5-325 MG per tablet  Take 1 tablet by mouth 3 (three) times daily as needed.           .  insulin glargine (LANTUS SOLOSTAR) 100 UNIT/ML injection  Inject 15 Units into the skin at  bedtime.   10 mL   12   .  lamoTRIgine (LAMICTAL) 100 MG tablet  Take 100 mg by mouth daily.           .  Lancets (ACCU-CHEK MULTICLIX) lancets  Twice daily testing   100 each   5   .  levothyroxine (LEVOXYL) 50 MCG tablet  Take 50 mcg by mouth daily.           [name]  LORazepam (ATIVAN) 1 MG tablet  Take 1 mg by mouth every 8 (eight) hours.           .  meclizine (ANTIVERT) 12.5 MG tablet  Take 1 tablet (12.5 mg total) by mouth 3 (three) times daily as needed. For nausea     30 tablet   0   .  metoprolol (LOPRESSOR) 50 MG tablet  Take 50 mg by mouth 2 (two) times daily.           .  mirtazapine (REMERON) 30 MG tablet  Take 30 mg by mouth. Take one tablet by mouth three times daily              .  nystatin (NYSTOP) 100000 UNIT/GM POWD  APPLY TO AFFECTED AREAS  TWICE DAILY.   60 g   2   .  ONGLYZA 5 MG TABS tablet  TAKE 1 TABLET BY MOUTH   ONCE DAILY.   30 tablet   4   .  PLAVIX 75 MG tablet  TAKE ONE TABLET BY MOUTH ONCE DAILY.   30 each   4   .  potassium chloride SA (KLOR-CON M20)  20 MEQ tablet  Take 20 mEq by mouth. As needed with furosemide              .  RIOMET 500 MG/5ML SOLN  TAKE 2 TEASPOONFULS      (10ml) BY MOUTH TWICEDAILY.   600 mL   3   .  sertraline (ZOLOFT) 50 MG tablet  Take 50 mg by mouth. Take three tablets by mouth once daily          .  SINGULAIR 10 MG tablet  TAKE ONE TABLET DAILY.   30 each   4   .  thiothixene (NAVANE) 2 MG capsule  Take 2 mg by mouth at bedtime.           .  traZODone (DESYREL) 150 MG tablet  Take 150 mg by mouth at bedtime.           .  VOLTAREN 1 % GEL  APPLY TWICE DAILY TO     AFFECTED AREA(S) ASNEEDED FOR PAIN.   100 g   2       Allergies as of 11/30/2010 - Review Complete 11/30/2010   Allergen  Reaction  Noted   .  Cephalexin       .  Milk-related compounds    07/29/2008   .  Penicillins       .  Phenazopyridine hcl    06/19/2007       Family History   Problem  Relation  Age of Onset   .  Heart attack  Mother     .  Pneumonia  Father     .  Kidney failure  Father     .  Cancer  Sister         pancreatic   .  Diabetes  Brother     .  Hypertension  Brother     .  Colon cancer  Neg Hx         History       Social History   .  Marital Status:  Divorced       Spouse Name:  N/A       Number of Children:  1   .  Years of Education:  N/A       Occupational History   .  disabled          Social History Main Topics   .  Smoking status:  Former Games developer   .  Smokeless tobacco:  None   .  Alcohol Use:  No   .  Drug Use:  No   .  Sexually Active:  None       Other Topics  Concern   .  None       Social History Narrative   .  None      Review of Systems: Gen: Denies fever, chills, anorexia. Denies fatigue, weakness, weight loss.   CV: Denies chest pain, palpitations, syncope, peripheral edema, and claudication. Resp: Denies dyspnea at rest, cough, wheezing, coughing up blood, and pleurisy. GI: Denies vomiting blood, jaundice, and fecal incontinence.   Denies dysphagia or odynophagia. Derm: Denies  rash, itching, dry skin Psych: Denies depression, anxiety, memory loss, confusion. No homicidal or suicidal ideation.   Heme: Denies bruising, bleeding, and enlarged lymph nodes.   Physical Exam: BP 130/75  Pulse 80  Temp(Src) 97.4 F (36.3 C) (Temporal)  Ht 5\' 2"  (1.575 m)  Wt 166 lb (75.297 kg)  BMI 30.36 kg/m2 General:   Alert and oriented. No distress noted. Drowsy, fell asleep during interview several times.   Head:  Normocephalic and atraumatic. Eyes:  Conjuctiva clear without scleral icterus. Mouth:  Oral mucosa pink and moist. No lesions. Neck:  Supple, without mass or thyromegaly. Heart:  S1, S2 present without murmurs, rubs, or gallops. Regular rate and rhythm. Abdomen:  +BS, soft, mildly tender to palpation lower abdomen, "band-like" and non-distended. No rebound or guarding. No HSM or masses noted. Msk:  Symmetrical without gross deformities. Normal posture. Extremities:  Without edema. Neurologic:  Alert and  oriented x4;  grossly normal neurologically. Skin:  Intact without significant lesions or rashes. Cervical Nodes:  No significant cervical adenopathy. Psych:  Alert and cooperative. Normal mood and affect.     [place] Score  12/05/2010  3:46 PM  Signed Cc to PCP  Gerrit Halls, NP  12/15/2010  1:51 PM  Signed Quick Note:   If not too much trouble, can we reorder UA. ______     Abdominal pain - Gerrit Halls, NP  12/04/2010 10:35 PM  Signed 60 year old female with multiple medical issues, chronic abdominal pain. Described as almost suprapubic, band-like distribution across lower abdomen. CT and [name] Jan 2012 unrevealing. Likely component of IBS, chronic abdominal pain. Levbid and Bentyl are not on formulary.  Colonoscopy fairly up-to-date. Will order CBC, BMP, UA and urine culture (?urinary etiology due to location, mild urinary symptoms such as frequency). Add ifobt.    Anemia - Gerrit Halls, NP  12/04/2010 10:36 PM  Signed Chronic anemia, EGD/colonoscopy up-to-date. Needs  updated CBC, ifobt. Appears overall Hgb drifting. If positive ifobt, will need to proceed with capsule endoscopy with placement via EGD, large bore dilation.    Dysphagia - Gerrit Halls, NP  12/04/2010 10:38 PM  Signed Complicated hx with most recent EGD/ED in Aug 2011. Failed manometry at [facility]. Assessing CBC and ifobt due to  hx of anemia prior to proceeding with EGD/ED. If +ifobt, will place capsule at time of EGD. Likely after dilation, will need referral again to [facility]. As of note, down 3 lbs since Feb 2012.    GASTROPARESIS - Gerrit Halls, NP  12/04/2010 10:39 PM  Signed As documented in Feb 2011. Multiple meds prescribed. Do not feel comfortable continuing Reglan at this point, especially with SSRI. If pt unable to tolerate cessation of this, consider domperidone. Continue Aciphex.      I have seen & examined the patient prior to the procedure(s) today and reviewed the history and physical/consultation.  H&H has improved. Stools test occult blood negative. Therefore will schedule. There have been no changes otherwise.  After consideration of the risks, benefits, alternatives and imponderables, the patient has consented to the procedure(s).

## 2010-12-29 NOTE — Addendum Note (Signed)
Addendum  created 12/29/10 1249 by Laurene Footman   Modules edited:Anesthesia Attestations

## 2010-12-30 ENCOUNTER — Other Ambulatory Visit: Payer: Self-pay | Admitting: Family Medicine

## 2011-01-02 ENCOUNTER — Telehealth: Payer: Self-pay | Admitting: Family Medicine

## 2011-01-04 ENCOUNTER — Telehealth: Payer: Self-pay | Admitting: Family Medicine

## 2011-01-04 ENCOUNTER — Other Ambulatory Visit: Payer: Self-pay | Admitting: Family Medicine

## 2011-01-04 NOTE — Telephone Encounter (Signed)
Mammogram due 12/13 or after pls order at the breast center per her request an let her know

## 2011-01-05 ENCOUNTER — Telehealth: Payer: Self-pay | Admitting: Family Medicine

## 2011-01-05 ENCOUNTER — Encounter (HOSPITAL_COMMUNITY): Payer: Self-pay | Admitting: Internal Medicine

## 2011-01-06 ENCOUNTER — Other Ambulatory Visit: Payer: Self-pay | Admitting: Family Medicine

## 2011-01-06 NOTE — Telephone Encounter (Signed)
Sent in the meclizine that she was requested

## 2011-01-12 ENCOUNTER — Telehealth: Payer: Self-pay | Admitting: Family Medicine

## 2011-01-19 ENCOUNTER — Other Ambulatory Visit: Payer: Self-pay | Admitting: Family Medicine

## 2011-01-19 DIAGNOSIS — Z1231 Encounter for screening mammogram for malignant neoplasm of breast: Secondary | ICD-10-CM

## 2011-01-19 NOTE — Telephone Encounter (Signed)
Pt called and stated that breast center sent her a letter in mail and she has appt with them. aware

## 2011-01-20 NOTE — Telephone Encounter (Signed)
See previous message

## 2011-01-24 ENCOUNTER — Ambulatory Visit: Payer: Medicare Other | Admitting: Gastroenterology

## 2011-01-24 ENCOUNTER — Telehealth: Payer: Self-pay | Admitting: Family Medicine

## 2011-01-25 ENCOUNTER — Telehealth: Payer: Self-pay | Admitting: Family Medicine

## 2011-01-25 NOTE — Telephone Encounter (Signed)
Asked her to come in today when some one cancelled and for tomorrow

## 2011-01-25 NOTE — Telephone Encounter (Signed)
noted 

## 2011-01-25 NOTE — Telephone Encounter (Signed)
Pt states she has been having headaches over left eye and now she has noticed a knot on her forehead the size of a nickel and its sore. States she has not injured the area and doesn't know why its there. Advised to make appt. She said she needs to give transportation 4 days in notice before they would bring her. Front to schedule.

## 2011-01-25 NOTE — Telephone Encounter (Signed)
Spoke with patient and has scheduled appt and routed msg to Dr

## 2011-01-25 NOTE — Telephone Encounter (Signed)
Pt has called back wanting to be seen today or tomorrow. Her whole head is hurting bad. Advised er and she said she did not want to go and she would wait til her appt but said please try to work her in sooner

## 2011-01-25 NOTE — Telephone Encounter (Signed)
If any availability comes up pls work in , but do not "add" to existing schedule pls

## 2011-01-27 ENCOUNTER — Ambulatory Visit (INDEPENDENT_AMBULATORY_CARE_PROVIDER_SITE_OTHER): Payer: Medicare Other | Admitting: Family Medicine

## 2011-01-27 ENCOUNTER — Encounter: Payer: Self-pay | Admitting: Family Medicine

## 2011-01-27 VITALS — BP 120/72 | HR 77 | Resp 16 | Ht 62.0 in | Wt 171.0 lb

## 2011-01-27 DIAGNOSIS — IMO0002 Reserved for concepts with insufficient information to code with codable children: Secondary | ICD-10-CM

## 2011-01-27 DIAGNOSIS — L0390 Cellulitis, unspecified: Secondary | ICD-10-CM

## 2011-01-27 DIAGNOSIS — R51 Headache: Secondary | ICD-10-CM

## 2011-01-27 DIAGNOSIS — I10 Essential (primary) hypertension: Secondary | ICD-10-CM

## 2011-01-27 DIAGNOSIS — E119 Type 2 diabetes mellitus without complications: Secondary | ICD-10-CM

## 2011-01-27 MED ORDER — BUTALBITAL-APAP-CAFFEINE 50-325-40 MG PO TABS: 1.0000 | ORAL_TABLET | Freq: Four times a day (QID) | ORAL | Status: DC | PRN

## 2011-01-27 MED ORDER — DOXYCYCLINE HYCLATE 100 MG PO TABS: 100.0000 mg | ORAL_TABLET | Freq: Two times a day (BID) | ORAL | Status: AC

## 2011-01-27 MED ORDER — KETOROLAC TROMETHAMINE 60 MG/2ML IM SOLN
60.0000 mg | Freq: Once | INTRAMUSCULAR | Status: AC
  Administered 2011-01-27: 60 mg via INTRAMUSCULAR

## 2011-01-27 NOTE — Assessment & Plan Note (Signed)
1 week h/o uncontrolled frontal headache and increased depression, having crying spells, will administer toradol

## 2011-01-27 NOTE — Assessment & Plan Note (Signed)
Improved following surgery 

## 2011-01-27 NOTE — Assessment & Plan Note (Signed)
Mild inflammation of skin on forehead, antibiotic prescribed

## 2011-01-27 NOTE — Progress Notes (Signed)
  Subjective:    Patient ID: [name], female    DOB: 1950/11/07, 60 y.o.   MRN: [id number]  HPI 1 week h/o increased frontal headache with red, tingly area on the forehead, was like a "knot" but has decreased in size.No h/o direct trauma to the area. States hse has become increasingly depressed since past 4 days, "from nowhere" States she has been crying excessively, not suicidal or homicidal, not hallucinating. Reports that she  has upcoming appointment with the neurologist   Review of Systems See HPI Denies recent fever or chills. Denies sinus pressure, nasal congestion, ear pain or sore throat. Denies chest congestion, productive cough or wheezing. Denies chest pains, palpitations and leg swelling Denies abdominal pain, nausea, vomiting,diarrhea or constipation.   Denies dysuria, frequency, hesitancy or incontinence. Chronic back pain improved following surgery. Denies headaches, seizures, numbness, or tingling.        Objective:   Physical Exam  Patient alert and oriented and in no cardiopulmonary distress.  HEENT: No facial asymmetry, EOMI, no sinus tenderness,  oropharynx pink and moist.  Neck supple no adenopathy.  Chest: Clear to auscultation bilaterally.  CVS: S1, S2 no murmurs, no S3.  ABD: Soft non tender. Bowel sounds normal.  Ext: No edema  MS: decreased  ROM spine,adequate in  shoulders, hips and knees.  Skin:erythematous macular rash on forehead  Psych: Good eye contact, normal affect. Memory intact not anxious or depressed appearing.  CNS: CN 2-12 intact, power, tone and sensation normal throughout.       Assessment & Plan:

## 2011-01-27 NOTE — Patient Instructions (Signed)
F/u as before.  Antibiotic tablet prescribed for irritated area on your forehead.  You will get toradol in the office for headache, and ask Dr Gerilyn Pilgrim for help with the headaches. I am sending in fioricet for the headache

## 2011-01-27 NOTE — Assessment & Plan Note (Signed)
Uncontrolled , pt reports improvement in blood sugars and has upcoming HBA1C next month

## 2011-01-27 NOTE — Assessment & Plan Note (Signed)
Controlled, no change in medication  

## 2011-01-31 ENCOUNTER — Telehealth: Payer: Self-pay | Admitting: Family Medicine

## 2011-02-01 ENCOUNTER — Ambulatory Visit: Payer: Medicare Other | Admitting: Family Medicine

## 2011-02-06 ENCOUNTER — Telehealth: Payer: Self-pay | Admitting: Family Medicine

## 2011-02-06 NOTE — Telephone Encounter (Signed)
Spoke with patient today in office and she just wanted dukes mouthwash refilled. No other complaints

## 2011-02-06 NOTE — Telephone Encounter (Signed)
Spoke with her when she came in and said she felt ok, nothing that needed an OV but she would like a refill on the dukes magic mouthwash. Seems to work well for her sore throat.

## 2011-02-06 NOTE — Telephone Encounter (Signed)
pls refill dukes x 1 and let her know

## 2011-02-07 ENCOUNTER — Ambulatory Visit: Payer: Medicare Other | Admitting: Gastroenterology

## 2011-02-07 MED ORDER — FIRST-DUKES MOUTHWASH MT SUSP: OROMUCOSAL | Status: DC

## 2011-02-07 NOTE — Telephone Encounter (Signed)
Sent in

## 2011-02-08 ENCOUNTER — Encounter: Payer: Self-pay | Admitting: Gastroenterology

## 2011-02-08 ENCOUNTER — Ambulatory Visit (INDEPENDENT_AMBULATORY_CARE_PROVIDER_SITE_OTHER): Payer: Medicare Other | Admitting: Gastroenterology

## 2011-02-08 VITALS — BP 145/77 | HR 102 | Temp 98.0°F | Ht 62.0 in | Wt 167.0 lb

## 2011-02-08 DIAGNOSIS — K219 Gastro-esophageal reflux disease without esophagitis: Secondary | ICD-10-CM

## 2011-02-08 DIAGNOSIS — K3184 Gastroparesis: Secondary | ICD-10-CM

## 2011-02-08 DIAGNOSIS — D649 Anemia, unspecified: Secondary | ICD-10-CM

## 2011-02-08 DIAGNOSIS — Z862 Personal history of diseases of the blood and blood-forming organs and certain disorders involving the immune mechanism: Secondary | ICD-10-CM

## 2011-02-08 DIAGNOSIS — G4700 Insomnia, unspecified: Secondary | ICD-10-CM

## 2011-02-08 DIAGNOSIS — Z8639 Personal history of other endocrine, nutritional and metabolic disease: Secondary | ICD-10-CM

## 2011-02-08 DIAGNOSIS — R197 Diarrhea, unspecified: Secondary | ICD-10-CM

## 2011-02-08 NOTE — Assessment & Plan Note (Addendum)
History of chronically elevated alkaline phosphatase. Last time her LFTs were checked they were normal. Continue to monitor. Recommend rechecking a fasting LFTs within the next 4 weeks or so. She has upcoming blood work for Dr. Lodema Hong and can have it done at that time

## 2011-02-08 NOTE — Assessment & Plan Note (Signed)
Hemoglobin improved when last checked one month ago. Multiple stools Hemoccult negative, again negative today. We'll simply continue to monitor her hemoglobin 3-4 times per year at this point.

## 2011-02-08 NOTE — Progress Notes (Signed)
Primary Care Physician: Syliva Overman, MD, MD  Primary Gastroenterologist:  Roetta Sessions, MD   Chief Complaint  Patient presents with  . Follow-up  . Dysphagia    does not feel good(? flu)    HPI: [name] is a 60 y.o. female here for followup of recent EGD. She states her swallowing has improved but she still unable to eat stringy meats. Has multiple complaints today. Feels bad since Saturday. Dizzy. Headache. Throat hurts. Gums hurt. Seen at Urgent Care yesterday, Dr. Wende Crease. Swabbed throat.  Also call Dr. Anthony Sar office but due to a previous appointment she had with her neurosurgeon, she could not be seen yesterday. Patient states Dr. Lodema Hong called in Dukes mixture. Complains of dry mouth. Sugars over two-hundred. Temperature 100.2 this morning. Temp 101.2 yesterday at urgent care. Taking Tylenol for fever. Some chornic intermitent lower abdominal pain but overall doing okay. Stools watery, 2-3 times per day. She has chronic diarrhea and fecal incontinence for years. Over the past 5 weeks she has noted some stool balls in underwear. Wears depends frequently. Cannot make it to bathroom at times. No straining. No blood. No melena. Still with back pain. Had to have 3 units of blood in July before and after her back surgery. Chronic nausea stable. Complains of "piece of meat" hanging out of her rectum.  Current Outpatient Prescriptions  Medication Sig Dispense Refill  . ACIPHEX 20 MG tablet TAKE 1 TABLET BY MOUTH   ONCE DAILY BEFORE A MEAL.  31 each  11  . ANTIVERT 12.5 MG tablet TAKE 1 TABLET BY MOUTH 3 TIMES DAILY AS NEEDED FOR DIZZINESS.  30 each  3  . BD INSULIN SYRINGE ULTRAFINE 31G X 5/16" 0.3 ML MISC USE AS DIRECTED.  100 each  3  . butalbital-acetaminophen-caffeine (FIORICET) 50-325-40 MG per tablet Take 1 tablet by mouth every 6 (six) hours as needed for headache.  30 tablet  2  . cevimeline (EVOXAC) 30 MG capsule        . cloNIDine (CATAPRES) 0.3 MG tablet        .  cyclobenzaprine (FLEXERIL) 10 MG tablet        . diclofenac sodium (VOLTAREN) 1 % GEL Apply 1 application topically daily as needed. Pain       . Diphenhyd-Hydrocort-Nystatin (FIRST-DUKES MOUTHWASH) SUSP Take 2 teaspoons three times daily as needed for hoarsenessUse as directed in the mouth or throat. Take 2 teaspoons three times daily as needed for hoarseness  237 mL  0  . diphenoxylate-atropine (LOMOTIL) 2.5-0.025 MG per tablet Take 1 tablet by mouth daily as needed. Diarrhea      . doxycycline (VIBRA-TABS) 100 MG tablet Take 1 tablet (100 mg total) by mouth 2 (two) times daily.  10 tablet  0  . ENDOCET 10-325 MG per tablet Take 1 tablet by mouth Every 8 hours as needed. Pain      . ergocalciferol (VITAMIN D2) 50000 UNITS capsule Take 50,000 Units by mouth once a week. Patient takes on Fridays.      . fluticasone (FLONASE) 50 MCG/ACT nasal spray Place 1 spray into both nostrils daily. Allergies      . furosemide (LASIX) 20 MG tablet Take 20 mg by mouth daily as needed. Swelling       . glipiZIDE (GLUCOTROL) 10 MG tablet TAKE 1 TABLET BY MOUTH   TWICE DAILY.  60 tablet  4  . glucose blood test strip Twice daily testing  100 each  5  . hydrochlorothiazide (,MICROZIDE/HYDRODIURIL,)  12.5 MG capsule TAKE ONE CAPSULE DAILY.  30 capsule  4  . lamoTRIgine (LAMICTAL) 100 MG tablet Take 100 mg by mouth daily.        . Lancets (ACCU-CHEK MULTICLIX) lancets Twice daily testing  100 each  5  . LANTUS SOLOSTAR 100 UNIT/ML injection INJECT 15 UNITS          SUBCUTANEOUSLY ATBEDTIME.  15 mL  2  . levothyroxine (LEVOXYL) 50 MCG tablet Take 50 mcg by mouth daily.        [name] LORazepam (ATIVAN) 1 MG tablet Take 1 mg by mouth 4 (four) times daily.       . Metformin HCl (RIOMET) 500 MG/5ML SOLN        . metoCLOPramide (REGLAN) 5 MG tablet Take 5 mg by mouth 4 (four) times daily.       . metoprolol (LOPRESSOR) 50 MG tablet Take 50 mg by mouth 2 (two) times daily.        . mirtazapine (REMERON) 30 MG tablet Take 30 mg  by mouth 3 (three) times daily.       [name] nystatin (NYSTOP) 100000 UNIT/GM POWD        . ONGLYZA 5 MG TABS tablet TAKE 1 TABLET BY MOUTH   ONCE DAILY.  30 tablet  4  . PLAVIX 75 MG tablet TAKE ONE TABLET BY MOUTH ONCE DAILY.  30 each  4  . potassium chloride SA (KLOR-CON M20) 20 MEQ tablet Take 20 mEq by mouth daily as needed. As needed with furosemide        . pregabalin (LYRICA) 75 MG capsule Take 75 mg by mouth 2 (two) times daily.        . rosuvastatin (CRESTOR) 10 MG tablet        . sertraline (ZOLOFT) 50 MG tablet Take 150 mg by mouth.       [name] SINGULAIR 10 MG tablet TAKE ONE TABLET DAILY.  30 each  4  . thiothixene (NAVANE) 2 MG capsule Take 2 mg by mouth at bedtime.          Allergies as of 02/08/2011 - Review Complete 02/08/2011  Allergen Reaction Noted  . Cephalexin Hives   . Milk-related compounds Other (See Comments) 07/29/2008  . Penicillins Hives   . Phenazopyridine hcl Other (See Comments) 06/19/2007    ROS:  General: See history of present illness.  ENT: Negative for hoarseness, difficulty swallowing , nasal congestion. Complains of sore throat CV: Negative for chest pain, angina, palpitations, dyspnea on exertion, peripheral edema.  Respiratory: Negative for dyspnea at rest, dyspnea on exertion, cough, sputum, wheezing.  GI: See history of present illness. GU:  Negative for dysuria, hematuria, urinary incontinence, urinary frequency, nocturnal urination.  Endo: Negative for unusual weight change.    Physical Examination:   BP 145/77  Pulse 102  Temp(Src) 98 F (36.7 C) (Temporal)  Ht 5\' 2"  (1.575 m)  Wt 167 lb (75.751 kg)  BMI 30.54 kg/m2  General: Well-nourished, well-developed in no acute distress. More alert than usual. Eyes: No icterus. Mouth: Oropharyngeal mucosa somewhat dry. White patches on tongue. No other lesions or exudate. Lungs: Clear to auscultation bilaterally.  Heart: Regular rate and rhythm, no murmurs rubs or gallops.  Abdomen: Bowel  sounds are normal, nontender, nondistended, no hepatosplenomegaly or masses, no abdominal bruits or hernia , no rebound or guarding.   Rectal exam: No external lesions noted initially. When I had the patient bear down a small skin tag protruded. Soft brown  stool Hemoccult negative. No masses in the rectal vault. Exam nontender. Poor sphincter tone. Extremities: No lower extremity edema. No clubbing or deformities. Neuro: Alert and oriented x 4   Skin: Warm and dry, no jaundice.   Psych: Alert and cooperative, normal mood and affect.  Labs:  Lab Results  Component Value Date   WBC 5.2 12/23/2010   HGB 11.3* 12/23/2010   HCT 37.5 12/23/2010   MCV 74.6* 12/23/2010   PLT 205 12/23/2010   Lab Results  Component Value Date   ALT 14 11/30/2010   AST 17 11/30/2010   ALKPHOS 101 11/30/2010   BILITOT 0.3 11/30/2010   Lab Results  Component Value Date   CREATININE 0.67 12/23/2010   BUN 15 12/23/2010   NA 141 12/23/2010   K 4.0 12/23/2010   CL 101 12/23/2010   CO2 32 12/23/2010   Lab Results  Component Value Date   FERRITIN 135 11/24/2010   Lab Results  Component Value Date   IRON 62 11/24/2010   TIBC 356 11/24/2010   FERRITIN 135 11/24/2010    Imaging Studies: No results found.

## 2011-02-08 NOTE — Assessment & Plan Note (Signed)
Stable

## 2011-02-08 NOTE — Assessment & Plan Note (Signed)
Chronic diarrhea, fecal incontinence. Recent antibiotic use for facial cellulitis. Rule out infection on top of chronic diarrhea especially given reported fever. Send stool for culture, C. difficile, Giardia/Cryptosporidium. If stool studies are negative, consider trial of Questran assuming no drug interactions.

## 2011-02-08 NOTE — Patient Instructions (Signed)
Please collect stool as soon as possible so we can rule out infection. Once the stool results are back, we may start you on a medication called Questran for chronic diarrhea. If you have persistent fever, throat pain, I would advise she called Dr. Anthony Sar office.

## 2011-02-08 NOTE — Progress Notes (Signed)
Cc to PCP 

## 2011-02-09 ENCOUNTER — Telehealth: Payer: Self-pay | Admitting: Family Medicine

## 2011-02-09 NOTE — Telephone Encounter (Signed)
Pt was called and decided to wait and see dr. Romeo Apple when she came in to see dr. Lodema Hong. And she is she thought she needed a referral are not. Stated she couldn't grip things good with her hands.

## 2011-02-10 ENCOUNTER — Telehealth: Payer: Self-pay | Admitting: Family Medicine

## 2011-02-14 ENCOUNTER — Telehealth: Payer: Self-pay | Admitting: Family Medicine

## 2011-02-14 ENCOUNTER — Telehealth: Payer: Self-pay | Admitting: Gastroenterology

## 2011-02-14 NOTE — Telephone Encounter (Signed)
Pt called- she is switching her insurance effective 02/17/11 and they will not cover Aciphex- she can not afford to continue this and would like to know what else she can be put on- please call her

## 2011-02-15 ENCOUNTER — Telehealth: Payer: Self-pay | Admitting: Internal Medicine

## 2011-02-15 NOTE — Telephone Encounter (Signed)
Said she has to have her Aciphex changed by the first of the year due to formulary change. She has tried to contact the [facility] who has been writing rx but so far, he has not changed it. Wants to know if you can change if or if she has to wait on him to do it?

## 2011-02-15 NOTE — Telephone Encounter (Signed)
Pt called again today to follow up on her medicines. She said she needed to know something by Friday due to insurance changes

## 2011-02-15 NOTE — Telephone Encounter (Signed)
Routed to refill box 

## 2011-02-15 NOTE — Telephone Encounter (Signed)
Patient aware.

## 2011-02-15 NOTE — Telephone Encounter (Signed)
She needs to wait on the doc who prescribes this, [facility] prob dr Jena Gauss, needs to lv a msg with them

## 2011-02-15 NOTE — Telephone Encounter (Signed)
Was talking about how much stress she has been under and not sleeping and always having so many appts and she always has a problem with transportation. Has been stressed b/c she thinks she may not be eligible for medicaid coming up and may lose her CAP service. She stated she has been crying a lot and cannot see her psych until Feb. Talked on the phone to me for a long time. Did mention that she was having suicidal thoughts lastweek with all the stress. Her POA has been out of town and she has been trying to get a IT trainer and hasn't heard anything back. I advised to call 911 if she has those thoughts and she said she didn't want to act on them, just been upset.

## 2011-02-15 NOTE — Telephone Encounter (Signed)
Noted, thanks for listening to her, your advice is correct

## 2011-02-22 ENCOUNTER — Telehealth: Payer: Self-pay

## 2011-02-22 ENCOUNTER — Telehealth: Payer: Self-pay | Admitting: Internal Medicine

## 2011-02-22 NOTE — Telephone Encounter (Signed)
Continue with the Pa pls

## 2011-02-22 NOTE — Telephone Encounter (Signed)
Please call patient back at  [phone]  She has many questions about her medicine and lab work that is coming up.

## 2011-02-23 NOTE — Telephone Encounter (Signed)
Spoke with pharmacy and there is nothing they can do about them not being covered until her new insurance goes into effect the beginning of the year. She is still good on the onglyza and they have tried to explain that to her

## 2011-02-23 NOTE — Telephone Encounter (Signed)
Tried to call pt- NA 

## 2011-02-24 ENCOUNTER — Ambulatory Visit
Admission: RE | Admit: 2011-02-24 | Discharge: 2011-02-24 | Disposition: A | Discharge status: Home / Self Care | Payer: Medicare Other | Source: Ambulatory Visit | Attending: Family Medicine | Admitting: Family Medicine

## 2011-02-24 DIAGNOSIS — Z1231 Encounter for screening mammogram for malignant neoplasm of breast: Secondary | ICD-10-CM

## 2011-02-27 ENCOUNTER — Telehealth: Payer: Self-pay | Admitting: Family Medicine

## 2011-02-27 ENCOUNTER — Other Ambulatory Visit: Payer: Self-pay | Admitting: Gastroenterology

## 2011-02-28 LAB — HEPATIC FUNCTION PANEL
AST: 36 U/L (ref 0–37)
Albumin: 4.2 g/dL (ref 3.5–5.2)
Alkaline Phosphatase: 122 U/L — ABNORMAL HIGH (ref 39–117)
Total Protein: 7.7 g/dL (ref 6.0–8.3)

## 2011-02-28 LAB — URINALYSIS
Bilirubin Urine: NEGATIVE
Ketones, ur: NEGATIVE mg/dL
Protein, ur: NEGATIVE mg/dL
Urobilinogen, UA: 0.2 mg/dL (ref 0.0–1.0)

## 2011-02-28 NOTE — Telephone Encounter (Signed)
I have seen no paper from [facility] so pls send back for this and give it to me, need to discuss this further also

## 2011-02-28 NOTE — Progress Notes (Signed)
Pt just turn in her stool sample

## 2011-02-28 NOTE — Telephone Encounter (Signed)
[facility] has [facility]

## 2011-03-01 ENCOUNTER — Telehealth: Payer: Self-pay | Admitting: Family Medicine

## 2011-03-01 NOTE — Telephone Encounter (Signed)
If she is having an acute spell can take up to every 8 hours, 3 per day, let her know and send in one time only script for that if she needs it now pls

## 2011-03-01 NOTE — Progress Notes (Signed)
Quick Note:  Please find out if the CDiff was collected. LFTs ok. Await final stool test results.   Recheck CBC in 4 months. ______

## 2011-03-01 NOTE — Telephone Encounter (Signed)
Pt is wanting to know if she can have a dose increase on her antivert?

## 2011-03-01 NOTE — Progress Notes (Signed)
Quick Note:  CDiff neg await stool culture final. ______

## 2011-03-01 NOTE — Progress Notes (Signed)
Called the lab and they said that the C Diff sample was turn in on Dec 18, so therefore we do not have any results

## 2011-03-02 NOTE — Telephone Encounter (Signed)
Called pt. No answer. Left message to return call 

## 2011-03-02 NOTE — Telephone Encounter (Signed)
Patient aware she has enough meds at this time

## 2011-03-03 ENCOUNTER — Telehealth: Payer: Self-pay | Admitting: Family Medicine

## 2011-03-03 LAB — STOOL CULTURE

## 2011-03-03 NOTE — Telephone Encounter (Signed)
Spoke with pt and she states that she has areas on her right hand and arm that are peeling.  And bruising on both arms.  She denies falling.  Instructed pt to keep the peeling areas moisturized and that if she continues to have problems or if the bruising spreads to go to urgent care or the ED.

## 2011-03-08 NOTE — Progress Notes (Signed)
Quick Note:  This was ordered Sept 2012, resulted mid-December. Noted. ______

## 2011-03-10 ENCOUNTER — Other Ambulatory Visit: Payer: Self-pay | Admitting: Family Medicine

## 2011-03-10 DIAGNOSIS — N63 Unspecified lump in unspecified breast: Secondary | ICD-10-CM

## 2011-03-10 LAB — HEMOGLOBIN A1C
Hgb A1c MFr Bld: 8 % — ABNORMAL HIGH (ref <5.7)
Mean Plasma Glucose: 183 mg/dL — ABNORMAL HIGH (ref <117)

## 2011-03-10 LAB — BASIC METABOLIC PANEL
BUN: 14 mg/dL (ref 6–23)
Calcium: 9.5 mg/dL (ref 8.4–10.5)
Creat: 0.69 mg/dL (ref 0.50–1.10)

## 2011-03-10 NOTE — Progress Notes (Signed)
Quick Note:    Stools negative  ______

## 2011-03-13 NOTE — Progress Notes (Signed)
Quick Note:  Tried to call pt- phone number was busy ______ 

## 2011-03-15 ENCOUNTER — Telehealth: Payer: Self-pay | Admitting: Internal Medicine

## 2011-03-15 NOTE — Telephone Encounter (Signed)
Called Pt she is aware of blood work and stool culture results. I told her that I would get with [name] in the morning to find out about her acid reflux medication. Her insurance will not pay for what she is on now, and then one of [name] would call her back.

## 2011-03-15 NOTE — Telephone Encounter (Signed)
Patient is asking for Labs & stool cx results, please advise? Shes also asking for another medication for Acid Reflux that is accepted by her insurance?

## 2011-03-16 ENCOUNTER — Encounter: Payer: Self-pay | Admitting: Family Medicine

## 2011-03-16 NOTE — Telephone Encounter (Signed)
Tried to call pt back, LMOM, informed her that we would try to do PA for aciphex. Paperwork on LSL cart.

## 2011-03-17 ENCOUNTER — Encounter: Payer: Self-pay | Admitting: Family Medicine

## 2011-03-17 ENCOUNTER — Ambulatory Visit (INDEPENDENT_AMBULATORY_CARE_PROVIDER_SITE_OTHER): Payer: Medicare Other | Admitting: Family Medicine

## 2011-03-17 VITALS — BP 130/80 | HR 91 | Resp 16 | Ht 62.0 in | Wt 165.1 lb

## 2011-03-17 DIAGNOSIS — G4730 Sleep apnea, unspecified: Secondary | ICD-10-CM

## 2011-03-17 DIAGNOSIS — E119 Type 2 diabetes mellitus without complications: Secondary | ICD-10-CM

## 2011-03-17 DIAGNOSIS — L309 Dermatitis, unspecified: Secondary | ICD-10-CM | POA: Insufficient documentation

## 2011-03-17 DIAGNOSIS — L259 Unspecified contact dermatitis, unspecified cause: Secondary | ICD-10-CM

## 2011-03-17 DIAGNOSIS — E785 Hyperlipidemia, unspecified: Secondary | ICD-10-CM

## 2011-03-17 DIAGNOSIS — E669 Obesity, unspecified: Secondary | ICD-10-CM

## 2011-03-17 DIAGNOSIS — I10 Essential (primary) hypertension: Secondary | ICD-10-CM

## 2011-03-17 MED ORDER — METFORMIN HCL 1000 MG PO TABS: 1000.0000 mg | ORAL_TABLET | Freq: Two times a day (BID) | ORAL | Status: DC

## 2011-03-17 MED ORDER — INSULIN GLARGINE 100 UNIT/ML ~~LOC~~ SOLN: 30.0000 [IU] | Freq: Every day | SUBCUTANEOUS | Status: DC

## 2011-03-17 NOTE — Patient Instructions (Addendum)
F/U in 6  weeks  Dose increase in lantus to 30 units at bedtime. Change to metformin tablet  Per your request from the liquid  Please check blood sugars  twice daily Fasting blood sugar goal range is 90 to 130  2 hours after any meal or at bedtime range is 130 to 170   You are being referred  To a dermatologist about the dry skin.  You are being referred to Dr Gerilyn Pilgrim about the sleep apnea  You need a shingles shot ,check your insurance company to see if they cover

## 2011-03-18 NOTE — Assessment & Plan Note (Signed)
Controlled, no change in medication  

## 2011-03-18 NOTE — Assessment & Plan Note (Signed)
Uncontrolled, dose increase on lantus, and change to metformin tablets per pt request, states she will split the pills

## 2011-03-18 NOTE — Assessment & Plan Note (Signed)
Deteriorated. Patient re-educated about  the importance of commitment to a  minimum of 150 minutes of exercise per week. The importance of healthy food choices with portion control discussed. Encouraged to start a food diary, count calories and to consider  joining a support group. Sample diet sheets offered. Goals set by the patient for the next several months.    

## 2011-03-18 NOTE — Assessment & Plan Note (Signed)
Hyperlipidemia:Low fat diet discussed and encouraged.  Fair control, TG slightly elevated, no med change, rept labs in March

## 2011-03-18 NOTE — Assessment & Plan Note (Signed)
Dryness and hyperpigmentation of the skin which is worsening in the past 4 to 6 weeks, will have derm evaluation

## 2011-03-18 NOTE — Assessment & Plan Note (Signed)
Reports needs equipment re asesed, would like evaluation localyl, and she is referred for this

## 2011-03-18 NOTE — Progress Notes (Signed)
Subjective:     Patient ID: [name], female   DOB: 11/03/1950, 61 y.o.   MRN: [id number]  HPI The PT is here for follow up and re-evaluation of chronic medical conditions, medication management and review of any available recent lab and radiology data.  Preventive health is updated, specifically  Cancer screening and Immunization.   Questions or concerns regarding consultations or procedures which the PT has had in the interim are  addressed. The PT denies any adverse reactions to current medications since the last visit.  C/o marked fluctuations in her blood sugars , also concerned about a rash and dryness of her skin which has been spreading over the past 4 to 6 weeks     Review of Systems See HPI Denies recent fever or chills. Denies sinus pressure, nasal congestion, ear pain or sore throat. Denies chest congestion, productive cough or wheezing. Denies chest pains, palpitations and leg swelling Denies abdominal pain, nausea, vomiting,diarrhea or constipation.   Denies dysuria, frequency, hesitancy or incontinence. Improved back pain. Denies headaches, seizures, numbness, or tingling. Denies depression,has increased anxiety due to family problems, chronic  insomnia. .        Objective:   Physical Exam Patient alert and oriented and in no cardiopulmonary distress.  HEENT: No facial asymmetry, EOMI, no sinus tenderness,  oropharynx pink and moist.  Neck supple no adenopathy.  Chest: Clear to auscultation bilaterally.  CVS: S1, S2 no murmurs, no S3.  ABD: Soft non tender. Bowel sounds normal.  Ext: No edema  MS:  Though reduced ROM spine, shoulders, hips and knees.  Skin: Intact, dry with hyperpigmentation in linear pattern.  Psych: Good eye contact, normal affect. Memory intact not anxious or depressed appearing.  CNS: CN 2-12 intact, power, tone and sensation normal throughout.     Assessment:         Plan:

## 2011-03-21 ENCOUNTER — Telehealth: Payer: Self-pay | Admitting: Family Medicine

## 2011-03-22 ENCOUNTER — Other Ambulatory Visit: Payer: Self-pay | Admitting: Family Medicine

## 2011-03-22 ENCOUNTER — Telehealth: Payer: Self-pay | Admitting: Family Medicine

## 2011-03-22 ENCOUNTER — Ambulatory Visit
Admission: RE | Admit: 2011-03-22 | Discharge: 2011-03-22 | Disposition: A | Discharge status: Home / Self Care | Payer: Medicare Other | Source: Ambulatory Visit | Attending: Family Medicine | Admitting: Family Medicine

## 2011-03-22 DIAGNOSIS — N63 Unspecified lump in unspecified breast: Secondary | ICD-10-CM

## 2011-03-22 NOTE — Telephone Encounter (Signed)
Mailed last labwork as requested

## 2011-03-23 NOTE — Telephone Encounter (Signed)
Called patient no answer.

## 2011-03-23 NOTE — Telephone Encounter (Signed)
She would have to pay up front and then submit a reimbursement form with a receipt to the insurance which they will mail to her.

## 2011-03-23 NOTE — Telephone Encounter (Signed)
Called the number provided and a recording states the number has changed and it gives a new number to call and it is another recording stating press 1 for residental 2 for business and 3 for horoscope.

## 2011-03-24 ENCOUNTER — Other Ambulatory Visit: Payer: Self-pay | Admitting: Family Medicine

## 2011-03-28 ENCOUNTER — Telehealth: Payer: Self-pay | Admitting: Internal Medicine

## 2011-03-28 ENCOUNTER — Other Ambulatory Visit: Payer: Self-pay | Admitting: Gastroenterology

## 2011-03-28 ENCOUNTER — Ambulatory Visit (HOSPITAL_COMMUNITY)
Admission: RE | Admit: 2011-03-28 | Discharge: 2011-03-28 | Disposition: A | Discharge status: Home / Self Care | Payer: Medicare Other | Source: Ambulatory Visit | Attending: Gastroenterology | Admitting: Gastroenterology

## 2011-03-28 DIAGNOSIS — R109 Unspecified abdominal pain: Secondary | ICD-10-CM | POA: Insufficient documentation

## 2011-03-28 DIAGNOSIS — K5909 Other constipation: Secondary | ICD-10-CM | POA: Insufficient documentation

## 2011-03-28 NOTE — Telephone Encounter (Signed)
Very nauseated bitter taste in her mouth/very constipated/stomach swollen/please advise??

## 2011-03-28 NOTE — Telephone Encounter (Signed)
Spoke with Mrs. Beachem, she has not had a BM since Saturday. she stated she started taking miralax last Wed, she took 2 laxatives on Sat, and 2 enemas on Sunday. She is also taking stool softeners, aciphex and restora. Pt had a small soft bm on Sunday but she said it was very tiny. Today her stomach hurts, she has a lot of nausea and abd seams to be swelling. Advised pt to stick to clear liquids and pain gets worse she would need to go to ED.   Any further recommendations?

## 2011-03-28 NOTE — Telephone Encounter (Signed)
Pt aware, please send order to xray, she said she would go to hospital and have it done.

## 2011-03-28 NOTE — Telephone Encounter (Signed)
Today if possible. Stick with clears.

## 2011-03-28 NOTE — Progress Notes (Signed)
Quick Note:  Contacted pt at 5:15 pm. Discussed reports. Instructed to take Miralax 17g by mouth each hour followed by 8 oz water 30 min after each dose, 3 doses. Dulcolax suppository. Pt states she doesn't have any at home but will need to get a neighbor to drive her to store.   Please follow-up on patient on Wednesday to see how she is doing. ______

## 2011-03-28 NOTE — Telephone Encounter (Signed)
Yes. Let's get a flat-plate abdominal xray, make sure no blockage.

## 2011-03-28 NOTE — Telephone Encounter (Signed)
Order put in.

## 2011-03-29 NOTE — Progress Notes (Signed)
Quick Note:  Pt called office and spoke with Crystal. She could not afford the miralax, so she took some laxatives that she had and drank some prune juice. Pt has only had a small bm today. Wants to know if we can send rx for generic miralax to see if her insurance will pay for it? Pt is also requesting something for nausea. Please advise. ______

## 2011-03-30 ENCOUNTER — Telehealth: Payer: Self-pay | Admitting: Family Medicine

## 2011-03-30 ENCOUNTER — Encounter: Payer: Self-pay | Admitting: Gastroenterology

## 2011-03-30 ENCOUNTER — Other Ambulatory Visit: Payer: Self-pay | Admitting: Gastroenterology

## 2011-03-30 ENCOUNTER — Encounter: Payer: Self-pay | Admitting: Internal Medicine

## 2011-03-30 MED ORDER — ONDANSETRON HCL 4 MG PO TABS: 4.0000 mg | ORAL_TABLET | Freq: Three times a day (TID) | ORAL | Status: DC | PRN

## 2011-03-30 MED ORDER — POLYETHYLENE GLYCOL 3350 17 GM/SCOOP PO POWD: 17.0000 g | Freq: Every day | ORAL | Status: AC

## 2011-03-30 NOTE — Progress Notes (Signed)
Generic Miralax and Zofran prn nausea sent to pharmacy.

## 2011-03-30 NOTE — Progress Notes (Signed)
Quick Note:  Will call in generic Miralax. Will provide prn Zofran. Have her f/u in office in next few weeks. ______

## 2011-03-31 NOTE — Telephone Encounter (Signed)
These are her mental health meds and they are not filling them. She said she has gotten in touch with them and they are calling her back

## 2011-04-06 IMAGING — CR DG CHEST 1V PORT
1 series · 1 of 1 positions shown · non-contrast
Comparison: Chest radiograph performed 05/29/2009

CLINICAL DATA: Right-sided chest pain and shortness of breath;
sensation of choking.

PORTABLE CHEST - 1 VIEW

[view not recorded]
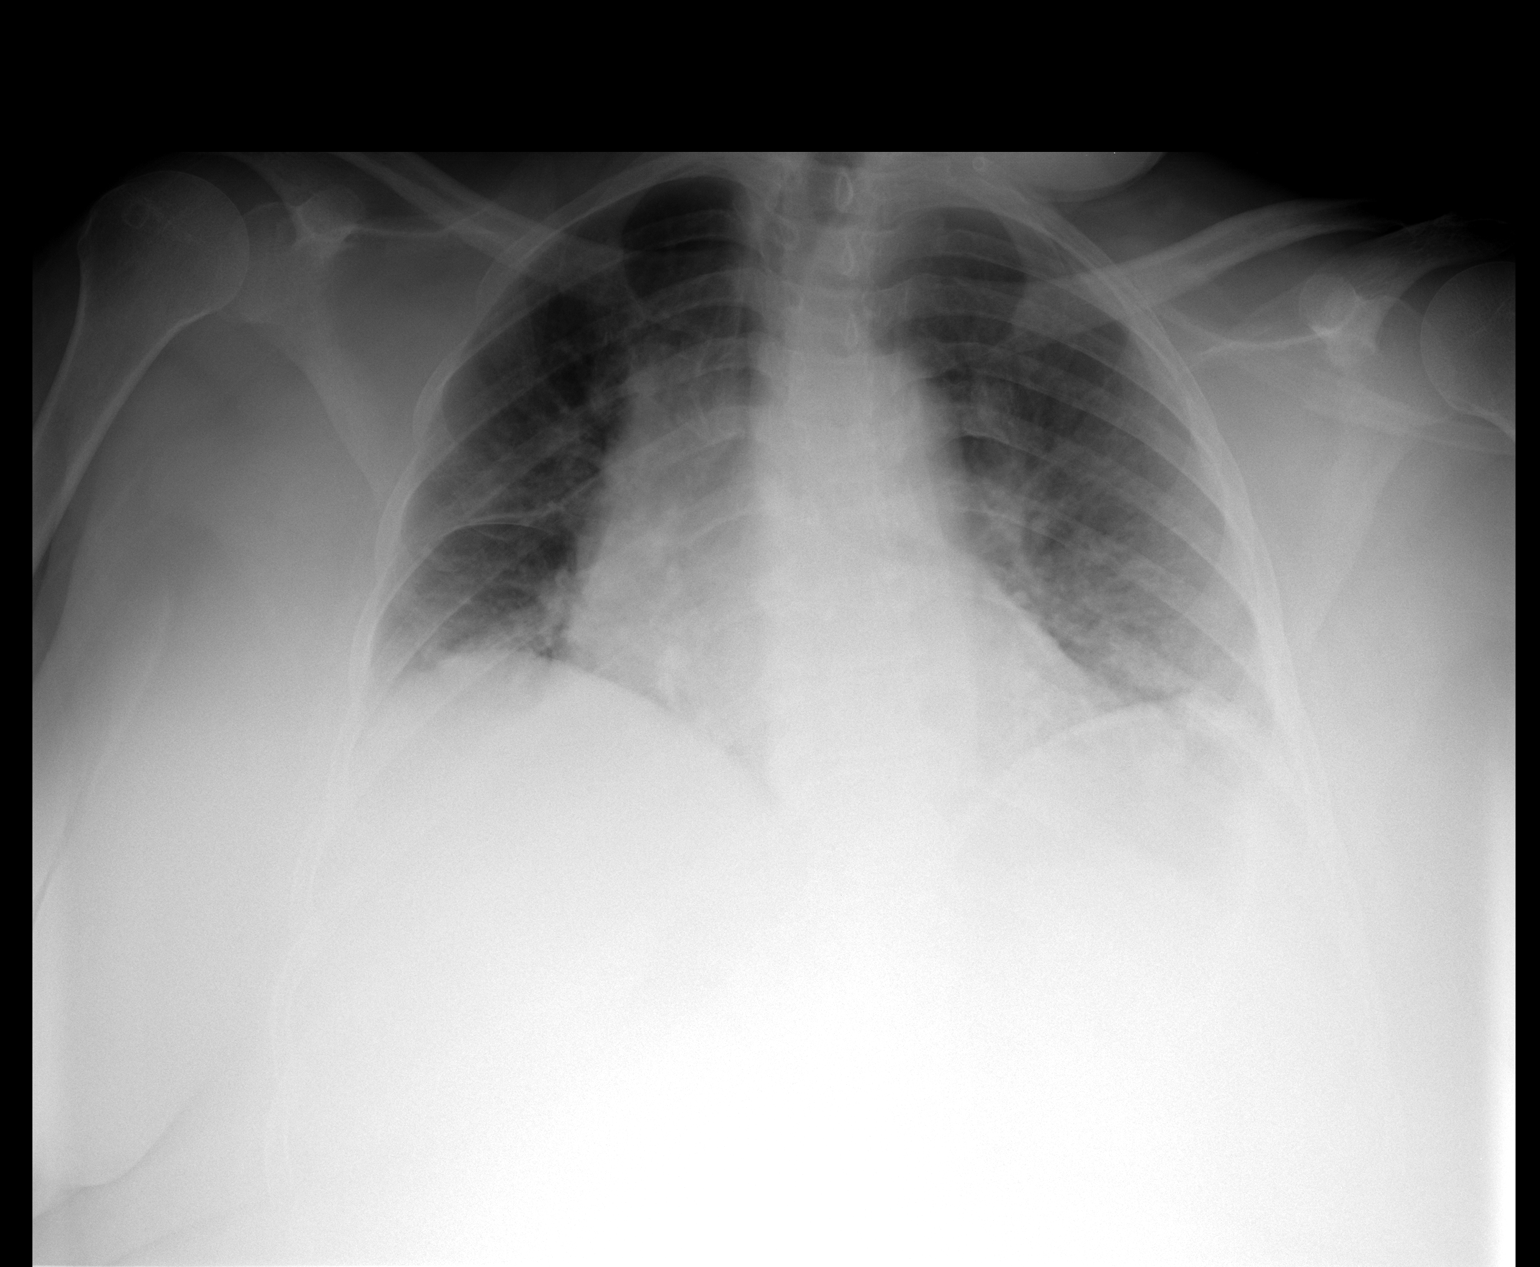

[1 of 1 positions shown; findings below may reference images not displayed]

FINDINGS: The lungs are hypoexpanded; mild bibasilar atelectasis is
noted.  Vascular crowding is seen.  No definite pleural effusion or
pneumothorax is identified.

The cardiomediastinal silhouette is mildly prominent but stable in
size.  No acute osseous abnormalities are identified.
IMPRESSION: Hypoexpanded lungs with mild bibasilar atelectasis.

## 2011-04-07 IMAGING — CT CT ANGIO CHEST
2 of 5 series · 6 of 36 positions shown · IV contrast (Omnipaque 300)
Comparison: Plain film of 1 day prior.  CT of 07/15/2008.

CLINICAL DATA: Chest pain.  Shortness of breath.

CT ANGIOGRAPHY OF THE CHEST
TECHNIQUE: Multidetector CT angiography of the chest was performed
after contrast with bolus timed to evaluate the pulmonary arteries.
Multiplanar CT image reconstructions including MIPs were obtained
to evaluate the vascular anatomy.
Contrast:  100 ml Omnipaque 350

[Series 7: pe 3.0 b40f · axial · 0.59mm/px · z∈[+1557,+1752]mm · 5 of 99 slices shown]
[im 17/99  lung]
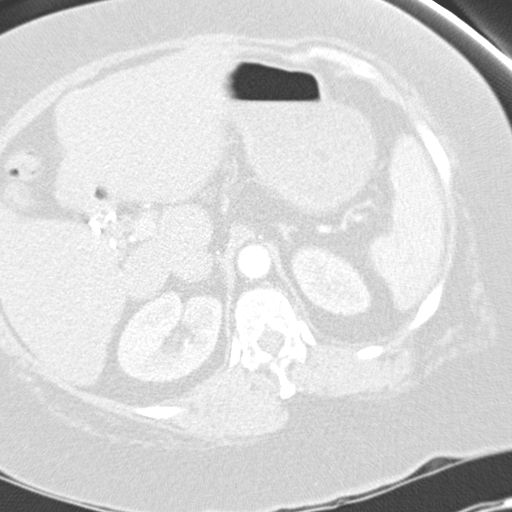
[im 33/99  mediastinal]
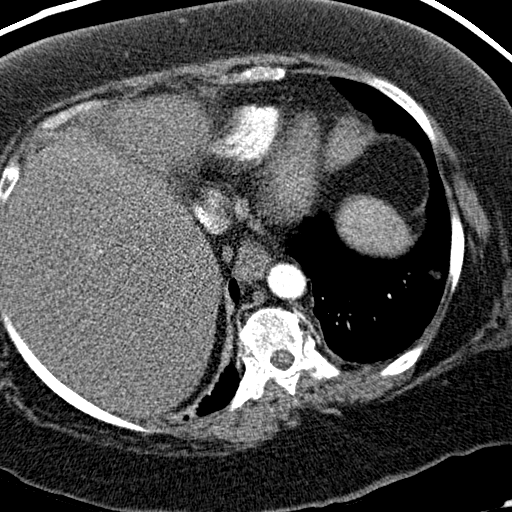
[im 50/99  lung]
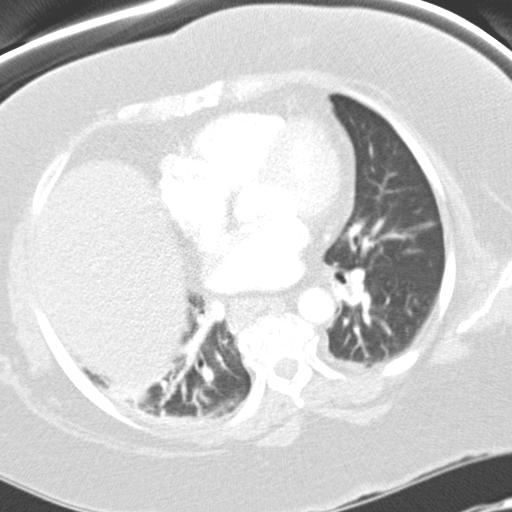
[im 66/99  mediastinal]
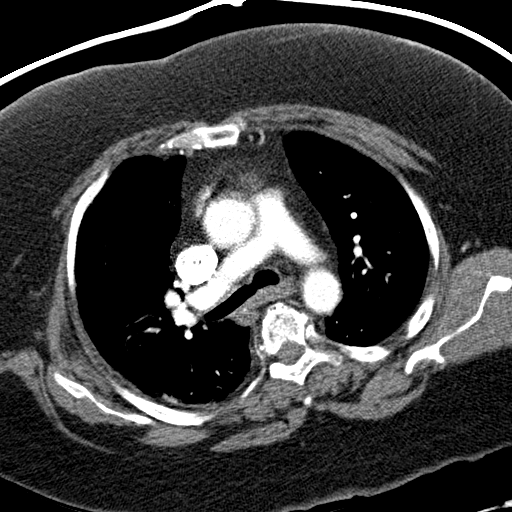
[im 82/99  lung]
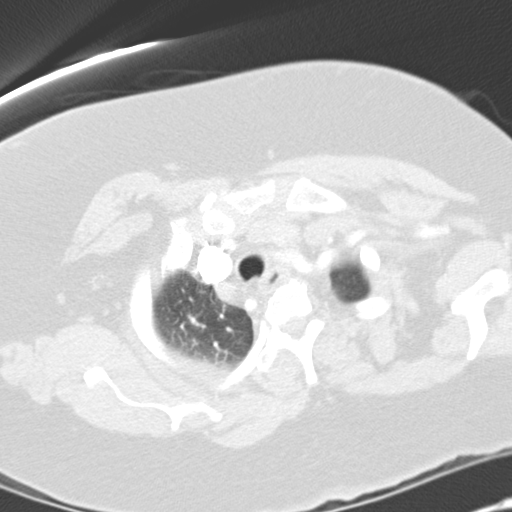

[Series 9: mpr coronal pe 3mm · coronal · 0.61mm/px · 1 of 101 slices shown]
[im 51/101  mediastinal]
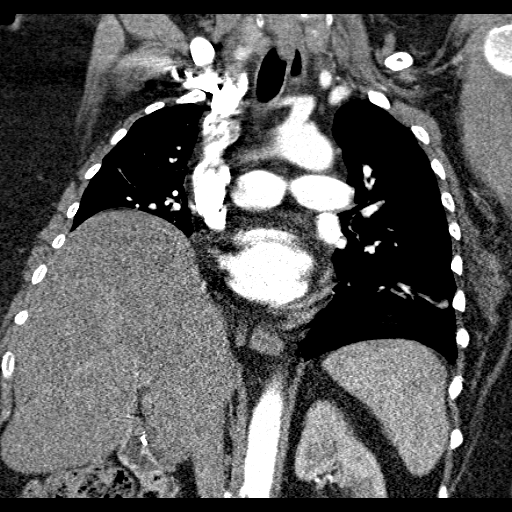

[6 of 36 positions shown; findings below may reference images not displayed]

FINDINGS: Lung windows demonstrate mild motion degradation.  Patchy
areas of lower lobe predominant atelectasis.  Extremely low lung
volumes.

Soft tissue windows:  The quality of this exam for evaluation of
pulmonary embolism is moderate.  The primary limitations are motion
and patient body habitus.  The bolus is relatively well timed.  No
pulmonary embolism identified.  Especially to the lower lobes,
small segmental emboli cannot be excluded.

Aberrant right subclavian artery, arising from the distal
transverse segment.  No aortic dissection or aneurysm.

Moderate cardiomegaly. No pericardial or pleural effusion.  Small
mediastinal lymph nodes without mediastinal or hilar adenopathy.

Limited abdominal imaging demonstrates fatty infiltration of the
liver. No acute osseous abnormality.

 Review of the MIP images confirms the above findings.
IMPRESSION: 1.  Moderate quality exam.  Limitations include patient body
habitus and mild motion artifact.  No evidence of pulmonary
embolism to the large segmental level level.
2.  Cardiomegaly.
3.  Aberrant right subclavian artery off the distal transverse
aorta.
4.  Fatty infiltration of the liver.

## 2011-04-12 ENCOUNTER — Telehealth: Payer: Self-pay | Admitting: Family Medicine

## 2011-04-13 ENCOUNTER — Ambulatory Visit (INDEPENDENT_AMBULATORY_CARE_PROVIDER_SITE_OTHER): Payer: Medicare Other | Admitting: Gastroenterology

## 2011-04-13 ENCOUNTER — Encounter: Payer: Self-pay | Admitting: Gastroenterology

## 2011-04-13 DIAGNOSIS — K589 Irritable bowel syndrome without diarrhea: Secondary | ICD-10-CM

## 2011-04-13 DIAGNOSIS — K5900 Constipation, unspecified: Secondary | ICD-10-CM | POA: Insufficient documentation

## 2011-04-13 DIAGNOSIS — M549 Dorsalgia, unspecified: Secondary | ICD-10-CM

## 2011-04-13 MED ORDER — LINACLOTIDE 145 MCG PO CAPS: 145.0000 ug | ORAL_CAPSULE | Freq: Every day | ORAL | Status: DC

## 2011-04-13 NOTE — Progress Notes (Signed)
Faxed to PCP

## 2011-04-13 NOTE — Assessment & Plan Note (Signed)
Now with c/o ongoing constipation. States worse since on endocet. Takes a couple per day. Miralax bid not helping too much with stool frequency although stools are soft. Patient doesn't feel as if she has complete BMs. Try of Linzess for next 30 days. Samples provided. Patient instructed to take one before breakfast daily. She will wean off miralax over next 2-3 days. Hold for significant diarrhea. PR in 3 weeks or sooner if needed.

## 2011-04-13 NOTE — Progress Notes (Signed)
Primary Care Physician: Syliva Overman, MD, MD  Primary Gastroenterologist:  Roetta Sessions, MD   Chief Complaint  Patient presents with  . Constipation    HPI: [name] is a 61 y.o. female here for f/u constipation. Historically patient has had issues with diarrhea. However, about 1-2 months ago she noted constipation. States she has had change in her pain medication about that length of time. No longer taking lomotil, has been out for more than one month. She has abd film which confirmed significant stool load. Started her on miralax which she has been taking twice daily per her report.   BM every 2-3 days. Stools are soft. No melena, brbpr. Abdominal cramps intermittent. No heartburn. No vomiting. Some nausea. No longer on reglan. Takes aciphex for hb.   Current Outpatient Prescriptions  Medication Sig Dispense Refill  . ACIPHEX 20 MG tablet TAKE 1 TABLET BY MOUTH   ONCE DAILY BEFORE A MEAL.  31 each  11  . ANTIVERT 12.5 MG tablet TAKE 1 TABLET BY MOUTH 3 TIMES DAILY AS NEEDED FOR DIZZINESS.  30 each  3  . BD INSULIN SYRINGE ULTRAFINE 31G X 5/16" 0.3 ML MISC USE AS DIRECTED.  100 each  3  . butalbital-acetaminophen-caffeine (FIORICET) 50-325-40 MG per tablet Take 1 tablet by mouth every 6 (six) hours as needed for headache.  30 tablet  2  . cloNIDine (CATAPRES) 0.3 MG tablet TAKE 1 TABLET BY MOUTH 3 TIMES DAILY: TAKE AT 8 A.M.;4 P.M.; AND MIDNIGHT.  90 tablet  3  . CRESTOR 10 MG tablet TAKE 1 TABLET BY MOUTH AT BEDTIME.  30 each  3  . diclofenac sodium (VOLTAREN) 1 % GEL Apply 1 application topically daily as needed. Pain       . ENDOCET 10-325 MG per tablet Take 1 tablet by mouth Every 8 hours as needed. Pain      . ergocalciferol (VITAMIN D2) 50000 UNITS capsule Take 50,000 Units by mouth once a week. Patient takes on Fridays.      [name] EVOXAC 30 MG capsule TAKE (1) CAPSULE BY MOUTH THREE TIMES A DAY.  90 each  3  . FLEXERIL 10 MG tablet TAKE 1 TABLET BY MOUTH TWICE DAILY.  60 each   3  . fluticasone (FLONASE) 50 MCG/ACT nasal spray Place 1 spray into both nostrils daily. Allergies      . furosemide (LASIX) 20 MG tablet Take 20 mg by mouth daily as needed. Swelling       . glipiZIDE (GLUCOTROL) 10 MG tablet TAKE 1 TABLET BY MOUTH TWICE DAILY.  60 tablet  3  . glucose blood test strip Twice daily testing  100 each  5  . hydrochlorothiazide (MICROZIDE) 12.5 MG capsule TAKE ONE CAPSULE DAILY.  30 capsule  3  . insulin glargine (LANTUS SOLOSTAR) 100 UNIT/ML injection Inject 30 Units into the skin at bedtime.  10 mL  12  . lamoTRIgine (LAMICTAL) 100 MG tablet Take 100 mg by mouth daily.        . Lancets (ACCU-CHEK MULTICLIX) lancets Twice daily testing  100 each  5  . levothyroxine (LEVOXYL) 50 MCG tablet Take 50 mcg by mouth daily.        [name] LORazepam (ATIVAN) 1 MG tablet Take 1 mg by mouth 4 (four) times daily.       . metFORMIN (GLUCOPHAGE) 1000 MG tablet Take 1 tablet (1,000 mg total) by mouth 2 (two) times daily with a meal.  60 tablet  11  .  metoprolol (LOPRESSOR) 50 MG tablet Take 50 mg by mouth 2 (two) times daily.        . mirtazapine (REMERON) 30 MG tablet Take 30 mg by mouth 3 (three) times daily.       [name] nystatin (NYSTOP) 100000 UNIT/GM POWD        . ONGLYZA 5 MG TABS tablet TAKE 1 TABLET BY MOUTH ONCE DAILY.  30 tablet  3  . PLAVIX 75 MG tablet TAKE ONE TABLET BY MOUTH ONCE DAILY.  30 each  3  . polyethylene glycol (MIRALAX / GLYCOLAX) packet Take 17 g by mouth 2 (two) times daily.      . potassium chloride SA (KLOR-CON M20) 20 MEQ tablet Take 20 mEq by mouth daily as needed. As needed with furosemide        . pregabalin (LYRICA) 75 MG capsule Take 75 mg by mouth 2 (two) times daily.        . sertraline (ZOLOFT) 50 MG tablet Take 150 mg by mouth.       [name] SINGULAIR 10 MG tablet TAKE ONE TABLET DAILY.  30 each  3  . thiothixene (NAVANE) 2 MG capsule Take 2 mg by mouth at bedtime.        . Linaclotide (LINZESS) 145 MCG CAPS Take 145 mcg by mouth daily before  breakfast.  30 capsule  0  . ondansetron (ZOFRAN) 4 MG tablet Take 1 tablet (4 mg total) by mouth every 8 (eight) hours as needed for nausea.  30 tablet  1    Allergies as of 04/13/2011 - Review Complete 04/13/2011  Allergen Reaction Noted  . Cephalexin Hives   . Milk-related compounds Other (See Comments) 07/29/2008  . Penicillins Hives   . Phenazopyridine hcl Other (See Comments) 06/19/2007    ROS:  General: Negative for anorexia, weight loss, fever, chills, fatigue, weakness. ENT: Negative for hoarseness, difficulty swallowing , nasal congestion. CV: Negative for chest pain, angina, palpitations, dyspnea on exertion, peripheral edema.  Respiratory: Negative for dyspnea at rest, dyspnea on exertion, cough, sputum, wheezing.  GI: See history of present illness. GU:  Negative for dysuria, hematuria, urinary incontinence, urinary frequency, nocturnal urination.  Endo: Negative for unusual weight change.    Physical Examination:   BP 136/79  Pulse 87  Temp(Src) 98.3 F (36.8 C) (Temporal)  Ht 5\' 2"  (1.575 m)  Wt 163 lb 3.2 oz (74.027 kg)  BMI 29.85 kg/m2  General: Well-nourished, well-developed in no acute distress. Sleepy today. Eyes: No icterus. Mouth: Oropharyngeal mucosa moist and pink , no lesions erythema or exudate. Lungs: Clear to auscultation bilaterally.  Heart: Regular rate and rhythm, no murmurs rubs or gallops.  Abdomen: Bowel sounds are normal, nontender, nondistended, no hepatosplenomegaly or masses, no abdominal bruits or hernia , no rebound or guarding.   Extremities: No lower extremity edema. No clubbing or deformities. Neuro: Alert and oriented x 4   Skin: Warm and dry, no jaundice.   Psych: Alert and cooperative, normal mood and affect.   Imaging Studies: Dg Abd 1 View  03/28/2011  *RADIOLOGY REPORT*  Clinical Data: Constipation and abdominal pain  ABDOMEN - 1 VIEW  Comparison: Lumbar spine radiographs - 09/28/2010; abdominal CT - 03/30/2010  Findings:   Moderate to large colonic stool burden.  No definite evidence of obstruction.  Evaluation for pneumoperitoneum is limited secondary exclusion of the lower thorax.  No definite pneumatosis.  Post cholecystectomy.  Post L4 and L5 laminectomy.  IMPRESSION: Moderate to large colonic stool burden without  evidence of obstruction.  Original Report Authenticated By: Judene Companion.D.

## 2011-04-13 NOTE — Assessment & Plan Note (Signed)
See constipation 

## 2011-04-13 NOTE — Patient Instructions (Signed)
Start Linzess one daily 30 minutes before breakfast. This is for constipation. You can wean yourself off Miralax as the Linzess starts to work.  Call in 3 weeks and let us know how you are doing. Stop Linzess if you have bad diarrhea.

## 2011-04-18 ENCOUNTER — Telehealth: Payer: Self-pay | Admitting: Family Medicine

## 2011-04-18 NOTE — Telephone Encounter (Signed)
Called pt- no option to leave message.

## 2011-04-18 NOTE — Telephone Encounter (Signed)
Pt said aciphex and xlonidine needed autorization. Advised aciphex was from another Dr and I called [facility] about the clonidine and they said it was no problem with it and she was able to get it. Said if it was rejected to give me a call back and let me know

## 2011-04-18 NOTE — Telephone Encounter (Signed)
See previous message

## 2011-04-26 ENCOUNTER — Telehealth: Payer: Self-pay

## 2011-04-26 NOTE — Telephone Encounter (Signed)
Cevimeline (Exovac) needs PA. Change or PA? [facility]

## 2011-04-27 ENCOUNTER — Inpatient Hospital Stay (HOSPITAL_COMMUNITY)
Admission: EM | Admit: 2011-04-27 | Discharge: 2011-04-28 | DRG: 948 | Disposition: A | Discharge status: Home Health | Payer: Medicare Other | Attending: Internal Medicine | Admitting: Internal Medicine

## 2011-04-27 ENCOUNTER — Emergency Department (HOSPITAL_COMMUNITY): Payer: Medicare Other

## 2011-04-27 ENCOUNTER — Ambulatory Visit (INDEPENDENT_AMBULATORY_CARE_PROVIDER_SITE_OTHER): Payer: Medicare Other | Admitting: Family Medicine

## 2011-04-27 ENCOUNTER — Encounter (HOSPITAL_COMMUNITY): Payer: Self-pay | Admitting: *Deleted

## 2011-04-27 ENCOUNTER — Encounter: Payer: Self-pay | Admitting: Family Medicine

## 2011-04-27 ENCOUNTER — Other Ambulatory Visit: Payer: Self-pay

## 2011-04-27 VITALS — BP 110/64 | HR 62 | Resp 16

## 2011-04-27 DIAGNOSIS — Z8673 Personal history of transient ischemic attack (TIA), and cerebral infarction without residual deficits: Secondary | ICD-10-CM

## 2011-04-27 DIAGNOSIS — K589 Irritable bowel syndrome without diarrhea: Secondary | ICD-10-CM

## 2011-04-27 DIAGNOSIS — IMO0001 Reserved for inherently not codable concepts without codable children: Secondary | ICD-10-CM

## 2011-04-27 DIAGNOSIS — I10 Essential (primary) hypertension: Secondary | ICD-10-CM

## 2011-04-27 DIAGNOSIS — H409 Unspecified glaucoma: Secondary | ICD-10-CM | POA: Diagnosis present

## 2011-04-27 DIAGNOSIS — E785 Hyperlipidemia, unspecified: Secondary | ICD-10-CM

## 2011-04-27 DIAGNOSIS — R143 Flatulence: Secondary | ICD-10-CM

## 2011-04-27 DIAGNOSIS — R7302 Impaired glucose tolerance (oral): Secondary | ICD-10-CM | POA: Diagnosis present

## 2011-04-27 DIAGNOSIS — IMO0002 Reserved for concepts with insufficient information to code with codable children: Secondary | ICD-10-CM | POA: Diagnosis present

## 2011-04-27 DIAGNOSIS — Z8639 Personal history of other endocrine, nutritional and metabolic disease: Secondary | ICD-10-CM

## 2011-04-27 DIAGNOSIS — R599 Enlarged lymph nodes, unspecified: Secondary | ICD-10-CM

## 2011-04-27 DIAGNOSIS — R3915 Urgency of urination: Secondary | ICD-10-CM

## 2011-04-27 DIAGNOSIS — F060 Psychotic disorder with hallucinations due to known physiological condition: Secondary | ICD-10-CM

## 2011-04-27 DIAGNOSIS — Z862 Personal history of diseases of the blood and blood-forming organs and certain disorders involving the immune mechanism: Secondary | ICD-10-CM

## 2011-04-27 DIAGNOSIS — R5383 Other fatigue: Secondary | ICD-10-CM

## 2011-04-27 DIAGNOSIS — G4700 Insomnia, unspecified: Secondary | ICD-10-CM

## 2011-04-27 DIAGNOSIS — Z9849 Cataract extraction status, unspecified eye: Secondary | ICD-10-CM

## 2011-04-27 DIAGNOSIS — R5381 Other malaise: Secondary | ICD-10-CM

## 2011-04-27 DIAGNOSIS — K5900 Constipation, unspecified: Secondary | ICD-10-CM | POA: Diagnosis present

## 2011-04-27 DIAGNOSIS — N644 Mastodynia: Secondary | ICD-10-CM

## 2011-04-27 DIAGNOSIS — R1084 Generalized abdominal pain: Secondary | ICD-10-CM

## 2011-04-27 DIAGNOSIS — D649 Anemia, unspecified: Secondary | ICD-10-CM

## 2011-04-27 DIAGNOSIS — K219 Gastro-esophageal reflux disease without esophagitis: Secondary | ICD-10-CM

## 2011-04-27 DIAGNOSIS — F319 Bipolar disorder, unspecified: Secondary | ICD-10-CM

## 2011-04-27 DIAGNOSIS — Z794 Long term (current) use of insulin: Secondary | ICD-10-CM

## 2011-04-27 DIAGNOSIS — R51 Headache: Secondary | ICD-10-CM

## 2011-04-27 DIAGNOSIS — R296 Repeated falls: Secondary | ICD-10-CM

## 2011-04-27 DIAGNOSIS — G8929 Other chronic pain: Secondary | ICD-10-CM | POA: Diagnosis present

## 2011-04-27 DIAGNOSIS — L309 Dermatitis, unspecified: Secondary | ICD-10-CM

## 2011-04-27 DIAGNOSIS — R141 Gas pain: Secondary | ICD-10-CM

## 2011-04-27 DIAGNOSIS — M79609 Pain in unspecified limb: Secondary | ICD-10-CM

## 2011-04-27 DIAGNOSIS — R4182 Altered mental status, unspecified: Secondary | ICD-10-CM | POA: Insufficient documentation

## 2011-04-27 DIAGNOSIS — K3184 Gastroparesis: Secondary | ICD-10-CM

## 2011-04-27 DIAGNOSIS — G4730 Sleep apnea, unspecified: Secondary | ICD-10-CM

## 2011-04-27 DIAGNOSIS — E782 Mixed hyperlipidemia: Secondary | ICD-10-CM | POA: Diagnosis present

## 2011-04-27 DIAGNOSIS — Z6831 Body mass index (BMI) 31.0-31.9, adult: Secondary | ICD-10-CM

## 2011-04-27 DIAGNOSIS — Z87891 Personal history of nicotine dependence: Secondary | ICD-10-CM

## 2011-04-27 DIAGNOSIS — R109 Unspecified abdominal pain: Secondary | ICD-10-CM

## 2011-04-27 DIAGNOSIS — K5730 Diverticulosis of large intestine without perforation or abscess without bleeding: Secondary | ICD-10-CM

## 2011-04-27 DIAGNOSIS — M7150 Other bursitis, not elsewhere classified, unspecified site: Secondary | ICD-10-CM

## 2011-04-27 DIAGNOSIS — R1011 Right upper quadrant pain: Secondary | ICD-10-CM

## 2011-04-27 DIAGNOSIS — D509 Iron deficiency anemia, unspecified: Secondary | ICD-10-CM

## 2011-04-27 DIAGNOSIS — G40909 Epilepsy, unspecified, not intractable, without status epilepticus: Secondary | ICD-10-CM | POA: Diagnosis present

## 2011-04-27 DIAGNOSIS — T7840XA Allergy, unspecified, initial encounter: Secondary | ICD-10-CM

## 2011-04-27 DIAGNOSIS — R079 Chest pain, unspecified: Secondary | ICD-10-CM

## 2011-04-27 DIAGNOSIS — E669 Obesity, unspecified: Secondary | ICD-10-CM

## 2011-04-27 DIAGNOSIS — L0390 Cellulitis, unspecified: Secondary | ICD-10-CM

## 2011-04-27 DIAGNOSIS — F39 Unspecified mood [affective] disorder: Secondary | ICD-10-CM | POA: Diagnosis present

## 2011-04-27 DIAGNOSIS — G4733 Obstructive sleep apnea (adult) (pediatric): Secondary | ICD-10-CM | POA: Diagnosis present

## 2011-04-27 DIAGNOSIS — M549 Dorsalgia, unspecified: Secondary | ICD-10-CM

## 2011-04-27 DIAGNOSIS — H9190 Unspecified hearing loss, unspecified ear: Secondary | ICD-10-CM

## 2011-04-27 DIAGNOSIS — R269 Unspecified abnormalities of gait and mobility: Secondary | ICD-10-CM

## 2011-04-27 DIAGNOSIS — Z79899 Other long term (current) drug therapy: Secondary | ICD-10-CM

## 2011-04-27 DIAGNOSIS — M25579 Pain in unspecified ankle and joints of unspecified foot: Secondary | ICD-10-CM

## 2011-04-27 DIAGNOSIS — E119 Type 2 diabetes mellitus without complications: Secondary | ICD-10-CM

## 2011-04-27 DIAGNOSIS — M810 Age-related osteoporosis without current pathological fracture: Secondary | ICD-10-CM

## 2011-04-27 DIAGNOSIS — Z91011 Allergy to milk products: Secondary | ICD-10-CM

## 2011-04-27 DIAGNOSIS — H6690 Otitis media, unspecified, unspecified ear: Secondary | ICD-10-CM

## 2011-04-27 DIAGNOSIS — N289 Disorder of kidney and ureter, unspecified: Secondary | ICD-10-CM

## 2011-04-27 DIAGNOSIS — S4350XA Sprain of unspecified acromioclavicular joint, initial encounter: Secondary | ICD-10-CM

## 2011-04-27 DIAGNOSIS — Z7902 Long term (current) use of antithrombotics/antiplatelets: Secondary | ICD-10-CM

## 2011-04-27 LAB — BASIC METABOLIC PANEL
GFR calc Af Amer: 50 mL/min — ABNORMAL LOW (ref >90)
GFR calc non Af Amer: 43 mL/min — ABNORMAL LOW (ref >90)
Potassium: 4.5 mEq/L (ref 3.5–5.1)
Sodium: 138 mEq/L (ref 135–145)

## 2011-04-27 LAB — URINE MICROSCOPIC-ADD ON

## 2011-04-27 LAB — RAPID URINE DRUG SCREEN, HOSP PERFORMED
Amphetamines: POSITIVE — AB
Barbiturates: NOT DETECTED
Opiates: NOT DETECTED
Tetrahydrocannabinol: NOT DETECTED

## 2011-04-27 LAB — DIFFERENTIAL
Basophils Relative: 0 % (ref 0–1)
Eosinophils Absolute: 0.2 10*3/uL (ref 0.0–0.7)
Lymphs Abs: 2.6 10*3/uL (ref 0.7–4.0)
Neutro Abs: 5.2 10*3/uL (ref 1.7–7.7)
Neutrophils Relative %: 62 % (ref 43–77)

## 2011-04-27 LAB — URINALYSIS, ROUTINE W REFLEX MICROSCOPIC
Bilirubin Urine: NEGATIVE
Glucose, UA: NEGATIVE mg/dL
Protein, ur: NEGATIVE mg/dL

## 2011-04-27 LAB — GLUCOSE, CAPILLARY: Glucose-Capillary: 367 mg/dL — ABNORMAL HIGH (ref 70–99)

## 2011-04-27 LAB — BLOOD GAS, ARTERIAL
Acid-base deficit: 3.2 mmol/L — ABNORMAL HIGH (ref 0.0–2.0)
Drawn by: 23588
O2 Saturation: 96.2 %
pO2, Arterial: 83.4 mmHg (ref 80.0–100.0)

## 2011-04-27 LAB — CBC
MCH: 22.7 pg — ABNORMAL LOW (ref 26.0–34.0)
MCHC: 31.1 g/dL (ref 30.0–36.0)
Platelets: 236 10*3/uL (ref 150–400)
RBC: 4.31 MIL/uL (ref 3.87–5.11)

## 2011-04-27 LAB — HEPATIC FUNCTION PANEL
AST: 20 U/L (ref 0–37)
Albumin: 3.9 g/dL (ref 3.5–5.2)

## 2011-04-27 LAB — POCT I-STAT TROPONIN I: Troponin i, poc: 0 ng/mL (ref 0.00–0.08)

## 2011-04-27 MED ORDER — THIOTHIXENE 2 MG PO CAPS
2.0000 mg | ORAL_CAPSULE | Freq: Every day | ORAL | Status: DC
  Administered 2011-04-27: 2 mg via ORAL
  Filled 2011-04-27 (×4): qty 1

## 2011-04-27 MED ORDER — ONDANSETRON HCL 4 MG PO TABS: 4.0000 mg | ORAL_TABLET | Freq: Four times a day (QID) | ORAL | Status: DC | PRN

## 2011-04-27 MED ORDER — SODIUM CHLORIDE 0.9 % IV SOLN: INTRAVENOUS | Status: AC

## 2011-04-27 MED ORDER — ONDANSETRON HCL 4 MG/2ML IJ SOLN: 4.0000 mg | Freq: Three times a day (TID) | INTRAMUSCULAR | Status: AC | PRN

## 2011-04-27 MED ORDER — INSULIN GLARGINE 100 UNIT/ML ~~LOC~~ SOLN
30.0000 [IU] | Freq: Every day | SUBCUTANEOUS | Status: DC
  Administered 2011-04-27: 30 [IU] via SUBCUTANEOUS

## 2011-04-27 MED ORDER — INSULIN ASPART 100 UNIT/ML ~~LOC~~ SOLN: 0.0000 [IU] | Freq: Three times a day (TID) | SUBCUTANEOUS | Status: DC

## 2011-04-27 MED ORDER — TRAZODONE HCL 50 MG PO TABS: 50.0000 mg | ORAL_TABLET | Freq: Every evening | ORAL | Status: DC | PRN

## 2011-04-27 MED ORDER — HYDROCODONE-ACETAMINOPHEN 5-325 MG PO TABS: 1.0000 | ORAL_TABLET | Freq: Four times a day (QID) | ORAL | Status: DC | PRN

## 2011-04-27 MED ORDER — SERTRALINE HCL 50 MG PO TABS
150.0000 mg | ORAL_TABLET | Freq: Every day | ORAL | Status: DC
  Administered 2011-04-27 – 2011-04-28 (×2): 150 mg via ORAL
  Filled 2011-04-27 (×2): qty 3

## 2011-04-27 MED ORDER — GLIPIZIDE 5 MG PO TABS
10.0000 mg | ORAL_TABLET | Freq: Every day | ORAL | Status: DC
  Administered 2011-04-28: 10 mg via ORAL
  Filled 2011-04-27: qty 2

## 2011-04-27 MED ORDER — LORAZEPAM 0.5 MG PO TABS: 0.5000 mg | ORAL_TABLET | Freq: Four times a day (QID) | ORAL | Status: DC | PRN

## 2011-04-27 MED ORDER — LINACLOTIDE 145 MCG PO CAPS
145.0000 ug | ORAL_CAPSULE | Freq: Every day | ORAL | Status: DC
  Filled 2011-04-27: qty 1

## 2011-04-27 MED ORDER — THIOTHIXENE 2 MG PO CAPS
ORAL_CAPSULE | ORAL | Status: AC
  Filled 2011-04-27: qty 1

## 2011-04-27 MED ORDER — INSULIN ASPART 100 UNIT/ML ~~LOC~~ SOLN
0.0000 [IU] | Freq: Every day | SUBCUTANEOUS | Status: DC
  Administered 2011-04-27: 2 [IU] via SUBCUTANEOUS

## 2011-04-27 MED ORDER — ROSUVASTATIN CALCIUM 5 MG PO TABS
10.0000 mg | ORAL_TABLET | Freq: Every day | ORAL | Status: DC
  Administered 2011-04-27: 10 mg via ORAL
  Filled 2011-04-27: qty 2

## 2011-04-27 MED ORDER — LEVOTHYROXINE SODIUM 25 MCG PO TABS
50.0000 ug | ORAL_TABLET | Freq: Every day | ORAL | Status: DC
  Administered 2011-04-28: 50 ug via ORAL
  Filled 2011-04-27: qty 2

## 2011-04-27 MED ORDER — MIRTAZAPINE 30 MG PO TABS
30.0000 mg | ORAL_TABLET | Freq: Every day | ORAL | Status: DC
  Administered 2011-04-27: 30 mg via ORAL
  Filled 2011-04-27: qty 1

## 2011-04-27 MED ORDER — ONDANSETRON HCL 4 MG/2ML IJ SOLN: 4.0000 mg | Freq: Four times a day (QID) | INTRAMUSCULAR | Status: DC | PRN

## 2011-04-27 MED ORDER — METOPROLOL TARTRATE 50 MG PO TABS
50.0000 mg | ORAL_TABLET | Freq: Two times a day (BID) | ORAL | Status: DC
  Administered 2011-04-27: 50 mg via ORAL
  Filled 2011-04-27: qty 1

## 2011-04-27 MED ORDER — SODIUM CHLORIDE 0.9 % IJ SOLN
3.0000 mL | Freq: Two times a day (BID) | INTRAMUSCULAR | Status: DC
  Filled 2011-04-27: qty 3

## 2011-04-27 MED ORDER — MONTELUKAST SODIUM 10 MG PO TABS
10.0000 mg | ORAL_TABLET | Freq: Every day | ORAL | Status: DC
Start: 2011-04-27 — End: 2011-04-28
  Administered 2011-04-27: 10 mg via ORAL
  Filled 2011-04-27: qty 1

## 2011-04-27 MED ORDER — ACETAMINOPHEN 325 MG PO TABS: 650.0000 mg | ORAL_TABLET | Freq: Four times a day (QID) | ORAL | Status: DC | PRN

## 2011-04-27 MED ORDER — METFORMIN HCL 500 MG PO TABS
1000.0000 mg | ORAL_TABLET | Freq: Two times a day (BID) | ORAL | Status: DC
  Administered 2011-04-27 – 2011-04-28 (×2): 1000 mg via ORAL
  Filled 2011-04-27 (×2): qty 2

## 2011-04-27 MED ORDER — SODIUM CHLORIDE 0.9 % IV SOLN
INTRAVENOUS | Status: DC
  Administered 2011-04-27: 125 mL/h via INTRAVENOUS
  Administered 2011-04-27: 13:00:00 via INTRAVENOUS

## 2011-04-27 MED ORDER — PREGABALIN 75 MG PO CAPS
75.0000 mg | ORAL_CAPSULE | Freq: Two times a day (BID) | ORAL | Status: DC
Start: 2011-04-27 — End: 2011-04-28
  Administered 2011-04-27 – 2011-04-28 (×2): 75 mg via ORAL
  Filled 2011-04-27 (×2): qty 1

## 2011-04-27 MED ORDER — POLYETHYLENE GLYCOL 3350 17 G PO PACK
17.0000 g | PACK | Freq: Two times a day (BID) | ORAL | Status: DC
  Filled 2011-04-27 (×2): qty 1

## 2011-04-27 MED ORDER — LAMOTRIGINE 25 MG PO TABS
ORAL_TABLET | ORAL | Status: AC
  Filled 2011-04-27: qty 4

## 2011-04-27 MED ORDER — LAMOTRIGINE 100 MG PO TABS
100.0000 mg | ORAL_TABLET | Freq: Every day | ORAL | Status: DC
  Administered 2011-04-27 – 2011-04-28 (×2): 100 mg via ORAL
  Filled 2011-04-27 (×5): qty 1

## 2011-04-27 MED ORDER — CLOPIDOGREL BISULFATE 75 MG PO TABS
75.0000 mg | ORAL_TABLET | Freq: Every day | ORAL | Status: DC
  Administered 2011-04-28: 75 mg via ORAL
  Filled 2011-04-27: qty 1

## 2011-04-27 MED ORDER — NALOXONE HCL 0.4 MG/ML IJ SOLN
0.4000 mg | Freq: Once | INTRAMUSCULAR | Status: AC
  Administered 2011-04-27: 0.4 mg via INTRAVENOUS
  Filled 2011-04-27: qty 1

## 2011-04-27 MED ORDER — ALBUTEROL SULFATE (5 MG/ML) 0.5% IN NEBU: 2.5000 mg | INHALATION_SOLUTION | RESPIRATORY_TRACT | Status: DC | PRN

## 2011-04-27 NOTE — Patient Instructions (Signed)
F/u  In 3 weeks   You need to go to the ED for furhter evaluation

## 2011-04-27 NOTE — ED Notes (Signed)
Report given to Marcelino Duster, RN, ICU. Ready to receive patient.

## 2011-04-27 NOTE — ED Notes (Signed)
Brought into ER  By son from Dr Luther Parody office, Very drowsy, opens eyes and mumbles with loud voice and shaking.

## 2011-04-27 NOTE — ED Provider Notes (Signed)
History   This chart was scribed for Dayton Bailiff, MD scribed by Magnus Sinning. The patient was seen in room APA02/APA02 seen at 12:29.     CSN: [id number]  Arrival date & time 04/27/11  1157   First MD Initiated Contact with Patient 04/27/11 1227      Chief Complaint  Patient presents with  . Altered Mental Status    (Consider location/radiation/quality/duration/timing/severity/associated sxs/prior treatment) HPI Level 5 Caveat-AMS  [name] is a 61 y.o. female who was brought to the Emergency Department for AMS. Per nurse report, pt was seen at Dr. Anthony Sar office earlier today and was reportedly alert and awake. She adds that pt went home and called her son to have her brought into the ED. Son reports that he was called and told that pt was very drowsy and mumbling. He informed PCP that he would take her to the ED to have her evaluated. Pt has hx of collapsing and falling asleep on the floor, but nothing that is similar to today's sx. Pt is on several medications and has hx of sleep apnea.   Past Medical History  Diagnosis Date  . Bipolar disorder   . CVA (cerebral infarction)   . Pancreatitis 2006    due to Depakote with normal EUS   . Osteoporosis   . Sleep apnea   . Chronic back pain   . Diabetes mellitus   . Trigger finger   . Anxiety disorder   . Hypertension   . Migraines   . Diverticulosis     TCS 9/08 by Dr. Lina Sar for diarrhea . Bx for micro scopic colitis negative.   . Schatzki's ring     non critical / EGD with ED 8/2011with RMR  . S/P colonoscopy 4098,1191, 2011    left-sided diverticula, hx of simple adenomas . 2011, random bx negative for microscopic colitis  . Glaucoma   . Allergic rhinitis   . Hypothyroidism     thyroid goiter  . Anemia   . Blood transfusion   . GERD (gastroesophageal reflux disease)   . Stroke     left sided weakness  . Seizures     unknown etiology-on meds-last seizure was 3 yerars ago  . Anxiety   . Depression      Past Surgical History  Procedure Date  . Abdominal hysterectomy   . Cholecystectomy   . Ovarian cyst removal   . Carpal tunnel release 07/22/04    left/ Dr. Romeo Apple   . Breast reduction surgery   . Bilateral cataract surgery   . Biopsy of thyroid gland 2009  . Surgical excision of 3 tumors from right thigh and right buttock  and left upper thigh 2010  . Back surgery July 2012  . Spine surgery 09/29/2010    dr brooks  . Maloney dilation 12/29/2010    Procedure: Elease Hashimoto DILATION;  Surgeon: Corbin Ade, MD;  Location: [facility];  Service: Endoscopy;  Laterality: N/A;  56, 58,  . Esophagogastroduodenoscopy 12/29/2010    Retained food in the esophagus and stomach, small hiatal hernia, status post dilation of the esophagus    Family History  Problem Relation Age of Onset  . Heart attack Mother   . Pneumonia Father   . Kidney failure Father   . Cancer Sister     pancreatic  . Diabetes Brother   . Hypertension Brother   . Colon cancer Neg Hx   . Anesthesia problems Neg Hx   . Hypotension Neg Hx   .  Malignant hyperthermia Neg Hx   . Pseudochol deficiency Neg Hx     History  Substance Use Topics  . Smoking status: Former Smoker    Quit date: 12/22/1992  . Smokeless tobacco: Not on file  . Alcohol Use: No   Review of Systems  Unable to perform ROS: Mental status change    Allergies  Cephalexin; Milk-related compounds; Penicillins; and Phenazopyridine hcl  Home Medications   Current Outpatient Rx  Name Route Sig Dispense Refill  . ACIPHEX 20 MG PO TBEC  TAKE 1 TABLET BY MOUTH   ONCE DAILY BEFORE A MEAL. 31 each 11  . ANTIVERT 12.5 MG PO TABS  TAKE 1 TABLET BY MOUTH 3 TIMES DAILY AS NEEDED FOR DIZZINESS. 30 each 3  . BD INSULIN SYRINGE ULTRAFINE 31G X 5/16" 0.3 ML MISC  USE AS DIRECTED. 100 each 3  . BUTALBITAL-APAP-CAFFEINE 50-325-40 MG PO TABS Oral Take 1 tablet by mouth every 6 (six) hours as needed for headache. 30 tablet 2  . CLONIDINE HCL 0.3 MG PO TABS  TAKE  1 TABLET BY MOUTH 3 TIMES DAILY: TAKE AT 8 A.M.;4 P.M.; AND MIDNIGHT. 90 tablet 3  . CRESTOR 10 MG PO TABS  TAKE 1 TABLET BY MOUTH AT BEDTIME. 30 each 3  . CYCLOSPORINE 0.05 % OP EMUL  1 drop 2 (two) times daily.    [name] DICLOFENAC SODIUM 1 % TD GEL Topical Apply 1 application topically daily as needed. Pain     . ENDOCET 10-325 MG PO TABS Oral Take 1 tablet by mouth Every 8 hours as needed. Pain    . ERGOCALCIFEROL 50000 UNITS PO CAPS Oral Take 50,000 Units by mouth once a week. Patient takes on Fridays.    [name] EVOXAC 30 MG PO CAPS  TAKE (1) CAPSULE BY MOUTH THREE TIMES A DAY. 90 each 3  . FLEXERIL 10 MG PO TABS  TAKE 1 TABLET BY MOUTH TWICE DAILY. 60 each 3  . FLUTICASONE PROPIONATE 50 MCG/ACT NA SUSP Each Nare Place 1 spray into both nostrils daily. Allergies    . FUROSEMIDE 20 MG PO TABS Oral Take 20 mg by mouth daily as needed. Swelling     . GLIPIZIDE 10 MG PO TABS  TAKE 1 TABLET BY MOUTH TWICE DAILY. 60 tablet 3  . GLUCOSE BLOOD VI STRP  Twice daily testing 100 each 5  . HYDROCHLOROTHIAZIDE 12.5 MG PO CAPS  TAKE ONE CAPSULE DAILY. 30 capsule 3  . INSULIN GLARGINE 100 UNIT/ML Rosser SOLN Subcutaneous Inject 30 Units into the skin at bedtime. 10 mL 12    Dose increase on lantus to 30 units daily, pt to t ...  . LAMOTRIGINE 100 MG PO TABS Oral Take 100 mg by mouth daily.      [name] ACCU-CHEK MULTICLIX LANCETS MISC  Twice daily testing 100 each 5  . LEVOTHYROXINE SODIUM 50 MCG PO TABS Oral Take 50 mcg by mouth daily.      [name] LINACLOTIDE 145 MCG PO CAPS Oral Take 145 mcg by mouth daily before breakfast. 30 capsule 0  . LORAZEPAM 1 MG PO TABS Oral Take 1 mg by mouth 4 (four) times daily.     [name] METFORMIN HCL 1000 MG PO TABS Oral Take 1 tablet (1,000 mg total) by mouth 2 (two) times daily with a meal. 60 tablet 11    Please note  That patient has changed from liquid  ...  . METHOCARBAMOL 500 MG PO TABS Oral Take 500 mg by mouth daily.    [name]  METOPROLOL TARTRATE 50 MG PO TABS Oral Take 50 mg by mouth 2 (two)  times daily.      [name] MIRTAZAPINE 30 MG PO TABS Oral Take 30 mg by mouth at bedtime.     . NYSTOP 100000 UNIT/GM EX POWD       . ONDANSETRON HCL 4 MG PO TABS Oral Take 1 tablet (4 mg total) by mouth every 8 (eight) hours as needed for nausea. 30 tablet 1  . ONGLYZA 5 MG PO TABS  TAKE 1 TABLET BY MOUTH ONCE DAILY. 30 tablet 3  . PLAVIX 75 MG PO TABS  TAKE ONE TABLET BY MOUTH ONCE DAILY. 30 each 3    Dispense as written.  [name] POLYETHYLENE GLYCOL 3350 PO PACK Oral Take 17 g by mouth 2 (two) times daily.    [name] POTASSIUM CHLORIDE CRYS ER 20 MEQ PO TBCR Oral Take 20 mEq by mouth daily as needed. As needed with furosemide      . PREGABALIN 75 MG PO CAPS Oral Take 75 mg by mouth 2 (two) times daily.      . SERTRALINE HCL 50 MG PO TABS Oral Take 150 mg by mouth.     [name] SINGULAIR 10 MG PO TABS  TAKE ONE TABLET DAILY. 30 each 3  . THIOTHIXENE 2 MG PO CAPS Oral Take 2 mg by mouth at bedtime.      . TRAZODONE HCL 150 MG PO TABS Oral Take 150 mg by mouth at bedtime.      BP 101/47  Pulse 58  Temp(Src) 97.4 F (36.3 C) (Rectal)  Wt 160 lb (72.576 kg)  SpO2 98%  Physical Exam  Nursing note and vitals reviewed. Constitutional: She is oriented to person, place, and time. She appears well-developed and well-nourished. She appears lethargic. No distress.       Pt is not unresponsive to commands.   HENT:  Head: Normocephalic and atraumatic.  Eyes: EOM are normal. Pupils are equal, round, and reactive to light.  Neck: Neck supple. No tracheal deviation present.  Cardiovascular: Regular rhythm and normal heart sounds.  Bradycardia present.   Pulmonary/Chest: Effort normal and breath sounds normal. No respiratory distress. She has no wheezes. She has no rales.  Abdominal: Soft. Bowel sounds are normal. She exhibits no distension. There is no tenderness.  Musculoskeletal: Normal range of motion. She exhibits no edema.  Neurological: She is oriented to person, place, and time. She appears lethargic. No sensory  deficit.  Skin: Skin is warm and dry.  Psychiatric: She has a normal mood and affect. Her behavior is normal.    ED Course  Procedures (including critical care time)  CRITICAL CARE Performed by: Dayton Bailiff   Total critical care time: 30 min  Critical care time was exclusive of separately billable procedures and treating other patients.  Critical care was necessary to treat or prevent imminent or life-threatening deterioration.  Critical care was time spent personally by me on the following activities: development of treatment plan with patient and/or surrogate as well as nursing, discussions with consultants, evaluation of patient's response to treatment, examination of patient, obtaining history from patient or surrogate, ordering and performing treatments and interventions, ordering and review of laboratory studies, ordering and review of radiographic studies, pulse oximetry and re-evaluation of patient's condition.   Date: 04/27/2011  Rate: 58  Rhythm: sinus bradycardia  QRS Axis: normal  Intervals: normal  ST/T Wave abnormalities: early repolarization  Conduction Disutrbances:none  Narrative Interpretation:   Old EKG Reviewed: unchanged  DIAGNOSTIC STUDIES: Oxygen Saturation is 94% on room air, normal by my interpretation.    COORDINATION OF CARE:  Labs Reviewed  GLUCOSE, CAPILLARY - Abnormal; Notable for the following:    Glucose-Capillary 367 (*)    All other components within normal limits  URINALYSIS, ROUTINE W REFLEX MICROSCOPIC - Abnormal; Notable for the following:    Specific Gravity, Urine >1.030 (*)    Hgb urine dipstick TRACE (*)    Ketones, ur TRACE (*)    All other components within normal limits  URINE RAPID DRUG SCREEN (HOSP PERFORMED) - Abnormal; Notable for the following:    Benzodiazepines POSITIVE (*)    Amphetamines POSITIVE (*)    All other components within normal limits  CBC - Abnormal; Notable for the following:    Hemoglobin 9.8 (*)     HCT 31.5 (*)    MCV 73.1 (*)    MCH 22.7 (*)    All other components within normal limits  BASIC METABOLIC PANEL - Abnormal; Notable for the following:    Glucose, Bld 195 (*)    BUN 28 (*)    Creatinine, Ser 1.32 (*)    GFR calc non Af Amer 43 (*)    GFR calc Af Amer 50 (*)    All other components within normal limits  HEPATIC FUNCTION PANEL - Abnormal; Notable for the following:    Total Bilirubin 0.2 (*)    Indirect Bilirubin 0.1 (*)    All other components within normal limits  URINE MICROSCOPIC-ADD ON - Abnormal; Notable for the following:    Squamous Epithelial / LPF MANY (*)    All other components within normal limits  DIFFERENTIAL  POCT I-STAT TROPONIN I  LACTIC ACID, PLASMA  BLOOD GAS, ARTERIAL   Ct Head Wo Contrast  04/27/2011  *RADIOLOGY REPORT*  Clinical Data: Altered mental status.  Lethargic.  Diabetes and hypertension.  CT HEAD WITHOUT CONTRAST  Technique:  Contiguous axial images were obtained from the base of the skull through the vertex without contrast.  Comparison: CT 12/12/2010  Findings: Ventricle size is normal.  Negative for hemorrhage.  No acute infarct.  Small calcifications are present above the cribriform plate.  There may be associated with hyperdensity in this area.  This is unchanged from the prior study.  This could represent a mass lesion such as meningioma.  No significant brain edema is present. Further evaluation with MRI is suggested.  Calvarium is intact.  Paranasal sinuses are clear.  IMPRESSION: Negative for acute infarct.  Small calcifications above the cribriform plate are unchanged from the  prior study.  Suspicion for underlying meningioma.  MRI brain without and with contrast is suggested for further evaluation.  Original Report Authenticated By: Camelia Phenes, M.D.   Dg Chest Port 1 View  04/27/2011  *RADIOLOGY REPORT*  Clinical Data: Altered mental status.  PORTABLE CHEST - 1 VIEW  Comparison: Chest x-ray 08/23/2010.  Findings: Lung volumes  are very low.  No definite consolidative airspace disease.  No definite pleural effusions.  Pulmonary vascular crowding (accentuated by low lung volumes), without frank pulmonary edema. The patient is rotated to the right on today's exam, resulting in distortion of the mediastinal contours and reduced diagnostic sensitivity and specificity for mediastinal pathology.  Heart size appears borderline enlarged mildly enlarged (likely accentuated by low lung volumes).  IMPRESSION: 1.  Low lung volumes without definite radiographic evidence of acute cardiopulmonary disease.  Original Report Authenticated By: Florencia Reasons, M.D.     1.  Altered mental status       MDM  Altered mental status likely secondary to overmedication. I reports it is a relatively unremarkable except for benzos and amphetamines in her urine. I do not see a medication that would cause amphetamines to show up in her urine. CT scan negative. Remainder of workup totally unremarkable. She remains sleepy. I will obtain an ABG to rule out CO2 retention. She'll require admission to the hospital for further evaluation. I personally performed the services described in this documentation, which was scribed in my presence. The recorded information has been reviewed and considered.        Dayton Bailiff, MD 04/27/11 [id number]

## 2011-04-27 NOTE — ED Notes (Signed)
Patient minimally responsive to vigorous sternal rub. Non-verbal. Patient pupil equal at 6 mm, responsive to light. Patient with food sediment in mouth, RN removed what could be. Skin warm/dry/color consistent with ethnicity. Respirations even and unlabored. Attached to cardiac monitor. Will continue to monitor. Awaiting MD eval.

## 2011-04-28 ENCOUNTER — Telehealth: Payer: Self-pay | Admitting: Family Medicine

## 2011-04-28 DIAGNOSIS — F319 Bipolar disorder, unspecified: Secondary | ICD-10-CM | POA: Diagnosis present

## 2011-04-28 DIAGNOSIS — N289 Disorder of kidney and ureter, unspecified: Secondary | ICD-10-CM | POA: Diagnosis present

## 2011-04-28 DIAGNOSIS — Z79899 Other long term (current) drug therapy: Secondary | ICD-10-CM

## 2011-04-28 DIAGNOSIS — F39 Unspecified mood [affective] disorder: Secondary | ICD-10-CM | POA: Diagnosis present

## 2011-04-28 LAB — GLUCOSE, CAPILLARY: Glucose-Capillary: 141 mg/dL — ABNORMAL HIGH (ref 70–99)

## 2011-04-28 MED ORDER — TRAZODONE HCL 150 MG PO TABS: 50.0000 mg | ORAL_TABLET | Freq: Every day | ORAL | Status: DC

## 2011-04-28 MED ORDER — METOPROLOL TARTRATE 50 MG PO TABS: 25.0000 mg | ORAL_TABLET | Freq: Two times a day (BID) | ORAL | Status: DC

## 2011-04-28 MED ORDER — CYCLOBENZAPRINE HCL 10 MG PO TABS: 5.0000 mg | ORAL_TABLET | Freq: Two times a day (BID) | ORAL | Status: DC | PRN

## 2011-04-28 MED ORDER — OXYCODONE-ACETAMINOPHEN 10-325 MG PO TABS: 0.5000 | ORAL_TABLET | Freq: Two times a day (BID) | ORAL | Status: DC | PRN

## 2011-04-28 MED ORDER — LORAZEPAM 1 MG PO TABS: 0.5000 mg | ORAL_TABLET | Freq: Three times a day (TID) | ORAL | Status: DC

## 2011-04-28 NOTE — Assessment & Plan Note (Signed)
Uncontrolled , but will need to address at next visit

## 2011-04-28 NOTE — Assessment & Plan Note (Signed)
Controlled, no change in medication  

## 2011-04-28 NOTE — Progress Notes (Signed)
  Subjective:    Patient ID: [name], female    DOB: October 25, 1950, 61 y.o.   MRN: [id number]  HPI Pt brought in for routine f/u by her aid. She is noted to be excessively drowsy and barely arousable even after approx 90 minutes in the office, in fact she became increasingly lethargic. Her son is called into the office , and he states she is more drowsy than he has seen in the past and requests ED eval with the hope that she will be hospitalized, which is appropriate. I made a direct call to the Ed physician and made him aware of the situation before the pt was taken across to th ED. Concern is voiced that pt is responsible for self administering her medication which she has a lot of , and whither or not she is actually capable of doing this is highly debatable. I do not believe there is intention to overdose or incorrectly administer her medicstion   Review of Systems See HPI Denies recent fever or chills. Denies sinus pressure, nasal congestion, ear pain or sore throat. Denies chest congestion, productive cough or wheezing. Denies chest pains, palpitations and leg swelling Denies uncontrolled  depression, anxiety or insomnia. Denies skin break down or rash.        Objective:   Physical Exam Patient drowsy, but arousable and in no cardiopulmonary distress.  HEENT: No facial asymmetry,   oropharynx pink and moist.  Neck supple no adenopathy.  Chest: Clear to auscultation bilaterally.  CVS: S1, S2 no murmurs, no S3.  ABD: Soft non tender. Bowel sounds normal.  Ext: No edema         Assessment & Plan:

## 2011-04-28 NOTE — Progress Notes (Signed)
CARE MANAGEMENT NOTE 04/28/2011  Patient:  [name], [name]   Account Number:  [id number]  Date Initiated:  04/28/2011  Documentation initiated by:  Rosemary Holms  Subjective/Objective Assessment:   Pt admitted for AMS and unable to awake. PTA lived alone.     Action/Plan:   Pt to resume HH RN / Aide and add HH PT.   Anticipated DC Date:  04/28/2011   Anticipated DC Plan:  HOME W HOME HEALTH SERVICES      DC Planning Services  CM consult      Choice offered to / List presented to:          [facility] arranged  HH-1 RN  HH-10 DISEASE MANAGEMENT  HH-2 PT  HH-4 NURSE'S AIDE      HH agency  [facility]   Status of service:  Completed, signed off Medicare Important Message given?   (If response is "NO", the following Medicare IM given date fields will be blank) Date Medicare IM given:   Date Additional Medicare IM given:    Discharge Disposition:  HOME W HOME HEALTH SERVICES  Per UR Regulation:    Comments:  04/28/11 1400 Oletta Buehring Leanord Hawking RN BSN CM

## 2011-04-28 NOTE — Assessment & Plan Note (Signed)
Pt barely arousable at visit, Ed for further eval and likely hospitalization, son present and involved in decision making

## 2011-04-28 NOTE — Telephone Encounter (Signed)
noted 

## 2011-04-28 NOTE — Progress Notes (Signed)
D/C home in stable condition. Transported home via private vehicle. Escorted to main entrance by staff. Discharge instructions given and explained. Pt verbalized understanding.Follow up appointments in place and pt verbalized understanding of keeping appointment. Home health PT/RN/CNA set up through [facility].  No acute distress noted.

## 2011-04-28 NOTE — Telephone Encounter (Signed)
pls try to pa, this is for dry mouth which she c/o alot

## 2011-04-28 NOTE — Discharge Summary (Signed)
Physician Discharge Summary  Patient ID: [name] MRN: [id number] DOB/AGE: [birth year] 61 y.o.  Admit date: 04/27/2011 Discharge date: 04/28/2011  Discharge Diagnoses:  Active Problems:  Altered mental status  Polypharmacy  DIABETES MELLITUS  ANEMIA  HYPERTENSION  Bipolar disorder  HYPERLIPIDEMIA  OBESITY  GERD  BACK PAIN, CHRONIC  SLEEP APNEA  Constipation  Renal insufficiency  urine drug screen positive for amphetamines, patient denies using same.  Medication List  As of 04/28/2011  7:33 PM   STOP taking these medications         accu-chek multiclix lancets      butalbital-acetaminophen-caffeine 50-325-40 MG per tablet      cloNIDine 0.3 MG tablet      glucose blood test strip      hydrochlorothiazide 12.5 MG capsule      methocarbamol 500 MG tablet         TAKE these medications         ACIPHEX 20 MG tablet   Generic drug: RABEprazole   TAKE 1 TABLET BY MOUTH   ONCE DAILY BEFORE A MEAL.      ANTIVERT 12.5 MG tablet   Generic drug: meclizine   TAKE 1 TABLET BY MOUTH 3 TIMES DAILY AS NEEDED FOR DIZZINESS.      BD INSULIN SYRINGE ULTRAFINE 31G X 5/16" 0.3 ML Misc   Generic drug: Insulin Syringe-Needle U-100   USE AS DIRECTED.      CRESTOR 10 MG tablet   Generic drug: rosuvastatin   TAKE 1 TABLET BY MOUTH AT BEDTIME.      cyclobenzaprine 10 MG tablet   Commonly known as: FLEXERIL   Take 0.5 tablets (5 mg total) by mouth 2 (two) times daily as needed for muscle spasms.      cycloSPORINE 0.05 % ophthalmic emulsion   Commonly known as: RESTASIS   1 drop 2 (two) times daily.      diclofenac sodium 1 % Gel   Commonly known as: VOLTAREN   Apply 1 application topically daily as needed. Pain      ergocalciferol 50000 UNITS capsule   Commonly known as: VITAMIN D2   Take 50,000 Units by mouth once a week. Patient takes on Fridays.      fluticasone 50 MCG/ACT nasal spray   Commonly known as: FLONASE   Place 1 spray into both nostrils daily. Allergies        furosemide 20 MG tablet   Commonly known as: LASIX   Take 20 mg by mouth daily as needed. Swelling      glipiZIDE 10 MG tablet   Commonly known as: GLUCOTROL   TAKE 1 TABLET BY MOUTH TWICE DAILY.      insulin glargine 100 UNIT/ML injection   Commonly known as: LANTUS   Inject 30 Units into the skin at bedtime.      KLOR-CON M20 20 MEQ tablet   Generic drug: potassium chloride SA   Take 20 mEq by mouth daily as needed. As needed with furosemide         lamoTRIgine 100 MG tablet   Commonly known as: LAMICTAL   Take 100 mg by mouth daily.      LEVOXYL 50 MCG tablet   Generic drug: levothyroxine   Take 50 mcg by mouth daily.      Linaclotide 145 MCG Caps   Take 145 mcg by mouth daily before breakfast.      LORazepam 1 MG tablet   Commonly known as: ATIVAN   Take  0.5 tablets (0.5 mg total) by mouth 3 (three) times daily.      metFORMIN 1000 MG tablet   Commonly known as: GLUCOPHAGE   Take 1 tablet (1,000 mg total) by mouth 2 (two) times daily with a meal.      metoprolol 50 MG tablet   Commonly known as: LOPRESSOR   Take 0.5 tablets (25 mg total) by mouth 2 (two) times daily.      mirtazapine 30 MG tablet   Commonly known as: REMERON   Take 30 mg by mouth at bedtime.      nystatin 100000 UNIT/GM Powd      ondansetron 4 MG tablet   Commonly known as: ZOFRAN   Take 1 tablet (4 mg total) by mouth every 8 (eight) hours as needed for nausea.      ONGLYZA 5 MG Tabs tablet   Generic drug: saxagliptin HCl   TAKE 1 TABLET BY MOUTH ONCE DAILY.      oxyCODONE-acetaminophen 10-325 MG per tablet   Commonly known as: PERCOCET   Take 0.5 tablets by mouth every 12 (twelve) hours as needed for pain. Pain      PLAVIX 75 MG tablet   Generic drug: clopidogrel   TAKE ONE TABLET BY MOUTH ONCE DAILY.      polyethylene glycol packet   Commonly known as: MIRALAX / GLYCOLAX   Take 17 g by mouth 2 (two) times daily.      pregabalin 75 MG capsule   Commonly known as: LYRICA    Take 75 mg by mouth 2 (two) times daily.      sertraline 100 MG tablet   Commonly known as: ZOLOFT   Take 150 mg by mouth daily.      SINGULAIR 10 MG tablet   Generic drug: montelukast   TAKE ONE TABLET DAILY.      thiothixene 2 MG capsule   Commonly known as: NAVANE   Take 2 mg by mouth at bedtime.      traZODone 150 MG tablet   Commonly known as: DESYREL   Take 0.5 tablets (75 mg total) by mouth at bedtime.            Discharge Orders    Future Appointments: Provider: Department: Dept Phone: Center:   05/04/2011 10:15 AM Syliva Overman, MD [facility] [phone] [facility]     Future Orders Please Complete By Expires   Diet - low sodium heart healthy      Diet Carb Modified      Walker          Follow-up Information    Follow up with Syliva Overman, MD on 05/04/2011. (Thursday at 1015)    Contact information:   [address], [address] [place] [address] [phone]       Follow up with [facility] PT/ RN/Aide [phone])          Disposition: Home-Health Care Svc  Discharged Condition: Stable  Consults:   physical therapy  Labs:   Results for orders placed during the hospital encounter of 04/27/11 (from the past 48 hour(s))  GLUCOSE, CAPILLARY     Status: Abnormal   Collection Time   04/27/11 12:05 PM      Component Value Range Comment   Glucose-Capillary 367 (*) 70 - 99 (mg/dL)    Comment 1 Notify RN     CBC     Status: Abnormal   Collection Time   04/27/11 12:26 PM  Component Value Range Comment   WBC 8.5  4.0 - 10.5 (K/uL)    RBC 4.31  3.87 - 5.11 (MIL/uL)    Hemoglobin 9.8 (*) 12.0 - 15.0 (g/dL)    HCT 82.9 (*) 56.2 - 46.0 (%)    MCV 73.1 (*) 78.0 - 100.0 (fL)    MCH 22.7 (*) 26.0 - 34.0 (pg)    MCHC 31.1  30.0 - 36.0 (g/dL)    RDW 13.0  86.5 - 78.4 (%)    Platelets 236  150 - 400 (K/uL)   DIFFERENTIAL     Status: Normal   Collection Time   04/27/11 12:26 PM      Component Value Range Comment    Neutrophils Relative 62  43 - 77 (%)    Neutro Abs 5.2  1.7 - 7.7 (K/uL)    Lymphocytes Relative 30  12 - 46 (%)    Lymphs Abs 2.6  0.7 - 4.0 (K/uL)    Monocytes Relative 6  3 - 12 (%)    Monocytes Absolute 0.5  0.1 - 1.0 (K/uL)    Eosinophils Relative 2  0 - 5 (%)    Eosinophils Absolute 0.2  0.0 - 0.7 (K/uL)    Basophils Relative 0  0 - 1 (%)    Basophils Absolute 0.0  0.0 - 0.1 (K/uL)   BASIC METABOLIC PANEL     Status: Abnormal   Collection Time   04/27/11 12:26 PM      Component Value Range Comment   Sodium 138  135 - 145 (mEq/[name])    Potassium 4.5  3.5 - 5.1 (mEq/[name])    Chloride 103  96 - 112 (mEq/[name])    CO2 23  19 - 32 (mEq/[name])    Glucose, Bld 195 (*) 70 - 99 (mg/dL)    BUN 28 (*) 6 - 23 (mg/dL)    Creatinine, Ser 6.96 (*) 0.50 - 1.10 (mg/dL)    Calcium 9.5  8.4 - 10.5 (mg/dL)    GFR calc non Af Amer 43 (*) >90 (mL/min)    GFR calc Af Amer 50 (*) >90 (mL/min)   URINALYSIS, ROUTINE W REFLEX MICROSCOPIC     Status: Abnormal   Collection Time   04/27/11 12:28 PM      Component Value Range Comment   Color, Urine YELLOW  YELLOW     APPearance CLEAR  CLEAR     Specific Gravity, Urine >1.030 (*) 1.005 - 1.030     pH 5.5  5.0 - 8.0     Glucose, UA NEGATIVE  NEGATIVE (mg/dL)    Hgb urine dipstick TRACE (*) NEGATIVE     Bilirubin Urine NEGATIVE  NEGATIVE     Ketones, ur TRACE (*) NEGATIVE (mg/dL)    Protein, ur NEGATIVE  NEGATIVE (mg/dL)    Urobilinogen, UA 0.2  0.0 - 1.0 (mg/dL)    Nitrite NEGATIVE  NEGATIVE     Leukocytes, UA NEGATIVE  NEGATIVE    URINE RAPID DRUG SCREEN (HOSP PERFORMED)     Status: Abnormal   Collection Time   04/27/11 12:28 PM      Component Value Range Comment   Opiates NONE DETECTED  NONE DETECTED     Cocaine NONE DETECTED  NONE DETECTED     Benzodiazepines POSITIVE (*) NONE DETECTED     Amphetamines POSITIVE (*) NONE DETECTED     Tetrahydrocannabinol NONE DETECTED  NONE DETECTED     Barbiturates NONE DETECTED  NONE DETECTED    URINE MICROSCOPIC-ADD ON  Status: Abnormal   Collection Time   04/27/11 12:28 PM      Component Value Range Comment   Squamous Epithelial / LPF MANY (*) RARE     WBC, UA 3-6  <3 (WBC/hpf)    RBC / HPF 0-2  <3 (RBC/hpf)    Bacteria, UA RARE  RARE    HEPATIC FUNCTION PANEL     Status: Abnormal   Collection Time   04/27/11 12:39 PM      Component Value Range Comment   Total Protein 7.2  6.0 - 8.3 (g/dL)    Albumin 3.9  3.5 - 5.2 (g/dL)    AST 20  0 - 37 (U/[name])    ALT 16  0 - 35 (U/[name])    Alkaline Phosphatase 83  39 - 117 (U/[name])    Total Bilirubin 0.2 (*) 0.3 - 1.2 (mg/dL)    Bilirubin, Direct 0.1  0.0 - 0.3 (mg/dL)    Indirect Bilirubin 0.1 (*) 0.3 - 0.9 (mg/dL)   POCT I-STAT TROPONIN I     Status: Normal   Collection Time   04/27/11 12:43 PM      Component Value Range Comment   Troponin i, poc 0.00  0.00 - 0.08 (ng/mL)    Comment 3            LACTIC ACID, PLASMA     Status: Abnormal   Collection Time   04/27/11 12:54 PM      Component Value Range Comment   Lactic Acid, Venous 2.9 (*) 0.5 - 2.2 (mmol/[name])   BLOOD GAS, ARTERIAL     Status: Abnormal   Collection Time   04/27/11  2:25 PM      Component Value Range Comment   pH, Arterial 7.316 (*) 7.350 - 7.400     pCO2 arterial 44.4  35.0 - 45.0 (mmHg)    pO2, Arterial 83.4  80.0 - 100.0 (mmHg)    Bicarbonate 22.0  20.0 - 24.0 (mEq/[name])    TCO2 20.9  0 - 100 (mmol/[name])    Acid-base deficit 3.2 (*) 0.0 - 2.0 (mmol/[name])    O2 Saturation 96.2      Patient temperature 37.0      Collection site RIGHT RADIAL      Drawn by [phone]      Sample type ARTERIAL      Allens test (pass/fail) PASS  PASS    ETHANOL     Status: Normal   Collection Time   04/27/11  3:00 PM      Component Value Range Comment   Alcohol, Ethyl (B) <11  0 - 11 (mg/dL)   MRSA PCR SCREENING     Status: Normal   Collection Time   04/27/11  3:58 PM      Component Value Range Comment   MRSA by PCR NEGATIVE  NEGATIVE    GLUCOSE, CAPILLARY     Status: Abnormal   Collection Time   04/27/11  4:58 PM       Component Value Range Comment   Glucose-Capillary 123 (*) 70 - 99 (mg/dL)   GLUCOSE, CAPILLARY     Status: Abnormal   Collection Time   04/27/11 10:21 PM      Component Value Range Comment   Glucose-Capillary 228 (*) 70 - 99 (mg/dL)   GLUCOSE, CAPILLARY     Status: Abnormal   Collection Time   04/28/11  7:45 AM      Component Value Range Comment   Glucose-Capillary 105 (*) 70 -  99 (mg/dL)    Comment 1 Notify RN      Comment 2 Documented in Chart     GLUCOSE, CAPILLARY     Status: Abnormal   Collection Time   04/28/11 11:54 AM      Component Value Range Comment   Glucose-Capillary 141 (*) 70 - 99 (mg/dL)    Comment 1 Notify RN      Comment 2 Documented in Chart       Diagnostics:  Ct Head Wo Contrast  04/27/2011  *RADIOLOGY REPORT*  Clinical Data: Altered mental status.  Lethargic.  Diabetes and hypertension.  CT HEAD WITHOUT CONTRAST  Technique:  Contiguous axial images were obtained from the base of the skull through the vertex without contrast.  Comparison: CT 12/12/2010  Findings: Ventricle size is normal.  Negative for hemorrhage.  No acute infarct.  Small calcifications are present above the cribriform plate.  There may be associated with hyperdensity in this area.  This is unchanged from the prior study.  This could represent a mass lesion such as meningioma.  No significant brain edema is present. Further evaluation with MRI is suggested.  Calvarium is intact.  Paranasal sinuses are clear.  IMPRESSION: Negative for acute infarct.  Small calcifications above the cribriform plate are unchanged from the  prior study.  Suspicion for underlying meningioma.  MRI brain without and with contrast is suggested for further evaluation.  Original Report Authenticated By: Camelia Phenes, M.D.   Dg Chest Port 1 View  04/27/2011  *RADIOLOGY REPORT*  Clinical Data: Altered mental status.  PORTABLE CHEST - 1 VIEW  Comparison: Chest x-ray 08/23/2010.  Findings: Lung volumes are very low.  No definite  consolidative airspace disease.  No definite pleural effusions.  Pulmonary vascular crowding (accentuated by low lung volumes), without frank pulmonary edema. The patient is rotated to the right on today's exam, resulting in distortion of the mediastinal contours and reduced diagnostic sensitivity and specificity for mediastinal pathology.  Heart size appears borderline enlarged mildly enlarged (likely accentuated by low lung volumes).  IMPRESSION: 1.  Low lung volumes without definite radiographic evidence of acute cardiopulmonary disease.  Original Report Authenticated By: Florencia Reasons, M.D.    Prior   Hospital Course: See H&P for complete admission details. The patient is a 61 year old black female with multiple medications and medical problems who presented to her primary care physician's office with somnolence. Apparently she was worsening while in the office and was sent to the emergency room. In the ER, her workup was significant for a urine drug screen positive for amphetamines, which she has no prescription for, and benzodiazepines. She takes Xanax daily. She was arousable to deep sternal rub only and had a lot of food matter in her mouth. She was however able to protect her airway. She was monitored in the step down unit and soon woke up. When asked about her urine drug screen she vehemently denied taking anyone else's medications or using illicit substances. When I looked through her bag of medications, there were about 10 bubble packs of medicines and multiple pills strewn in the bag. I suspect she inadvertently took her medications and properly. I have asked her to cut back on many of her sedating medications. Please see above. Her blood pressure was borderline low and I've also cut back on her antihypertensives. Particularly clonidine which is sedating. I have encouraged skilled nursing facility placement, as recommended by family and physical therapy. She declines, so I will arrange home  physical therapy. She already has a Engineer, civil (consulting) and in a coming to her house. She is in denial with respect to being able to take her medications properly. She really needs her regimen simplified. I will deferred to her primary care physician. I've asked the home health nurse to make a visit tomorrow and assist with medication changes. Total time on the day of discharge is greater than 30 minutes.  Discharge Exam:  Blood pressure 122/60, pulse 98, temperature 99.2 F (37.3 C), temperature source Oral, resp. rate 32, height 5\' 2"  (1.575 m), weight 78.4 kg (172 lb 13.5 oz), SpO2 97.00%.  Alert. Oriented. Appropriate. Talkative Lungs clear to auscultation bilaterally without wheeze rhonchi or rales Cardiovascular regular rate rhythm without murmurs gallops rubs Abdomen soft nontender nondistended Extremities no clubbing cyanosis or edema   Signed: Arrion Broaddus [name] 04/28/2011, 7:33 PM

## 2011-04-28 NOTE — Evaluation (Signed)
Physical Therapy Evaluation Patient Details Name: [name] MRN: [id number] DOB: [birth year] Today's Date: 04/28/2011  Problem List:  Patient Active Problem List  Diagnoses  . DIABETES MELLITUS  . HYPERLIPIDEMIA  . OBESITY  . ANEMIA, IRON DEFICIENCY, CHRONIC  . ANEMIA  . PSYCHOTIC D/O W/HALLUCINATIONS CONDS CLASS ELSW  . LOM  . HEARING LOSS  . HYPERTENSION  . GERD  . GASTROPARESIS  . DIVERTICULOSIS, COLON  . IBS  . MASTALGIA  . ANKLE PAIN  . BACK PAIN, CHRONIC  . TRIGGER FINGER  . LEG PAIN, RIGHT  . OSTEOPOROSIS  . INSOMNIA  . SLEEP APNEA  . FATIGUE  . ABNORMALITY OF GAIT  . HEADACHE  . CERVICAL LYMPHADENOPATHY, LEFT  . ABDOMINAL BLOATING  . URINARY URGENCY  . ABDOMINAL PAIN, GENERALIZED  . ACROMIOCLAVICULAR SPRAIN AND STRAIN  . ALLERGY UNSPECIFIED NOT ELSEWHERE CLASSIFIED  . LIVER FUNCTION TESTS, ABNORMAL, HX OF  . MILK PRODUCTS ALLERGY  . CHEST PAIN UNSPECIFIED  . RUQ PAIN  . FLANK PAIN  . Dysphagia  . Abdominal pain  . IDDM (insulin dependent diabetes mellitus)  . Falls frequently  . Cellulitis  . Dermatitis  . Constipation  . Altered mental status  . Polypharmacy  . Bipolar disorder  . Renal insufficiency    Past Medical History:  Past Medical History  Diagnosis Date  . Bipolar disorder   . CVA (cerebral infarction)   . Pancreatitis 2006    due to Depakote with normal EUS   . Osteoporosis   . Sleep apnea   . Chronic back pain   . Diabetes mellitus   . Trigger finger   . Anxiety disorder   . Hypertension   . Migraines   . Diverticulosis     TCS 9/08 by Dr. Lina Sar for diarrhea . Bx for micro scopic colitis negative.   . Schatzki's ring     non critical / EGD with ED 8/2011with [facility]  . S/P colonoscopy 4098,1191, 2011    left-sided diverticula, hx of simple adenomas . 2011, random bx negative for microscopic colitis  . Glaucoma   . Allergic rhinitis   . Hypothyroidism     thyroid goiter  . Anemia   . Blood transfusion   . GERD  (gastroesophageal reflux disease)   . Stroke     left sided weakness  . Seizures     unknown etiology-on meds-last seizure was 3 yerars ago  . Anxiety   . Depression    Past Surgical History:  Past Surgical History  Procedure Date  . Abdominal hysterectomy   . Cholecystectomy   . Ovarian cyst removal   . Carpal tunnel release 07/22/04    left/ Dr. Romeo Apple   . Breast reduction surgery   . Bilateral cataract surgery   . Biopsy of thyroid gland 2009  . Surgical excision of 3 tumors from right thigh and right buttock  and left upper thigh 2010  . Back surgery July 2012  . Spine surgery 09/29/2010    dr brooks  . Maloney dilation 12/29/2010    Procedure: Elease Hashimoto DILATION;  Surgeon: Corbin Ade, MD;  Location: [facility];  Service: Endoscopy;  Laterality: N/A;  56, 58,  . Esophagogastroduodenoscopy 12/29/2010    Retained food in the esophagus and stomach, small hiatal hernia, status post dilation of the esophagus    PT Assessment/Plan/Recommendation PT Assessment Clinical Impression Statement: pt cooperative, but mentation somewhat slow.Marland KitchenMarland KitchenMMT is a bit difficult to assess as pt sometimes resists motion opposite to  what she is being asked to do...apparently she ambulates with a cane at home, but currently she needs a walker for gait to ambulate 70'...knees begin to buckle after ambulating about 44' although she never lost control of LE's and had no LOB...at baseline, she appears to be functioning at a very low level and she would be appropriate for SNF.Marland KitchenMarland Kitchenfrom nursing report, she is resistant to this...therefore, my recommendation is that she have HHPT, use a walker for gait and have 24 hour supervision    PT Recommendation/Assessment: All further PT needs can be met in the next venue of care PT Problem List: Decreased strength;Decreased activity tolerance;Decreased mobility;Decreased safety awareness;Decreased knowledge of use of DME;Decreased knowledge of precautions PT Therapy Diagnosis :  Difficulty walking;Abnormality of gait;Generalized weakness PT Recommendation Follow Up Recommendations: Home health PT Equipment Recommended: Defer to next venue PT Goals     PT Evaluation Precautions/Restrictions  Precautions Precautions: Fall Required Braces or Orthoses: No Restrictions Weight Bearing Restrictions: No Prior Functioning  Home Living Lives With: Alone Receives Help From: Family Type of Home: Apartment Home Layout: One level Home Access: Level entry Bathroom Shower/Tub: Engineer, manufacturing systems: Standard Home Adaptive Equipment: Straight cane;Walker - rolling;Wheelchair - manual Prior Function Level of Independence: Independent with gait;Independent with transfers;Requires assistive device for independence;Needs assistance with homemaking;Needs assistance with ADLs Driving: No Cognition Cognition Arousal/Alertness: Awake/alert Overall Cognitive Status: Appears within functional limits for tasks assessed Orientation Level: Oriented to person;Oriented to place;Oriented to situation;Oriented to time Sensation/Coordination Sensation Light Touch: Appears Intact Stereognosis: Not tested Hot/Cold: Not tested Proprioception: Appears Intact Coordination Gross Motor Movements are Fluid and Coordinated: Yes Fine Motor Movements are Fluid and Coordinated: Yes Extremity Assessment RUE Assessment RUE Assessment: Within Functional Limits LUE Assessment LUE Assessment: Within Functional Limits (slightly weaker than RUE (3=/5)) RLE Assessment RLE Assessment: Within Functional Limits LLE Assessment LLE Assessment: Within Functional Limits Mobility (including Balance) Bed Mobility Bed Mobility: Yes Rolling Right: 7: Independent Rolling Left: 7: Independent Supine to Sit: 7: Independent Sit to Supine: 7: Independent Transfers Transfers: Yes Sit to Stand: 5: Supervision Stand to Sit: 6: Modified independent (Device/Increase  time) Ambulation/Gait Ambulation/Gait: Yes Ambulation/Gait Assistance: 5: Supervision Ambulation/Gait Assistance Details (indicate cue type and reason): frequent cues to place hands on walker correctly, to increase thoracic extension Ambulation Distance (Feet): 70 Feet Assistive device: Rolling walker Gait Pattern: Trunk flexed;Right flexed knee in stance;Left flexed knee in stance;Shuffle Stairs: No Wheelchair Mobility Wheelchair Mobility: No  Posture/Postural Control Posture/Postural Control: Postural limitations Postural Limitations: cervical lordosis, thoracic kyphosis Balance Balance Assessed: No Exercise    End of Session PT - End of Session Equipment Utilized During Treatment: Gait belt Activity Tolerance: Patient tolerated treatment well;Patient limited by fatigue Patient left: in bed;with call bell in reach Nurse Communication: Mobility status for transfers;Mobility status for ambulation General Behavior During Session: [facility] for tasks performed Cognition: [facility] for tasks performed  Konrad Penta 04/28/2011, 12:06 PM

## 2011-04-28 NOTE — H&P (Signed)
Hospital Admission Note Date: 04/28/2011  Patient name: [name] Medical record number: [id number] Date of birth: October 07, 1950 Age: 61 y.o. Gender: female PCP: Syliva Overman, MD, MD  Attending physician: Christiane Ha, MD  Chief Complaint: somnolence  History of Present Illness:  [name] is an 61 y.o. female who was sent to the emergency room by her primary care physician with somnolence. She is on multiple medications which could be sedating. She is unable to provide any history. Her son is here and has concerns about her multiple medications, and that patient may have inadvertently taken her medications incorrectly. He reports that she was in her usual state of health without any somnolence several days ago when he saw her last. 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 The son reports that patient has voiced no suicidal ideation. She does not drink or use drugs recreationally. Urine drug screen is positive for amphetamines and benzodiazepines. Patient has no medications on her list that would show up positive for amphetamines. There is a bag of medications including about 10 bubble packs, with multiple loose pills strewn about in the bag. Otherwise, workup has been fairly unremarkable in the emergency room. The patient is for the most part somnolent but able to briefly rales with deep sternal rub.  Past Medical History  Diagnosis Date  . Bipolar disorder   . CVA (cerebral infarction)   . Pancreatitis 2006    due to Depakote with normal EUS   . Osteoporosis   . Sleep apnea   . Chronic back pain   . Diabetes mellitus   . Trigger finger   . Anxiety disorder   . Hypertension   . Migraines   . Diverticulosis     TCS 9/08 by Dr. Lina Sar for diarrhea . Bx for micro scopic colitis negative.   . Schatzki's ring     non critical / EGD with ED 8/2011with RMR  . S/P colonoscopy 7829,5621, 2011    left-sided diverticula, hx of simple adenomas . 2011, random bx  negative for microscopic colitis  . Glaucoma   . Allergic rhinitis   . Hypothyroidism     thyroid goiter  . Anemia   . Blood transfusion   . GERD (gastroesophageal reflux disease)   . Stroke     left sided weakness  . Seizures     unknown etiology-on meds-last seizure was 3 yerars ago  . Anxiety   . Depression     Meds:  Medications Prior to Admission  Medication Sig Dispense Refill  . ACIPHEX 20 MG tablet TAKE 1 TABLET BY MOUTH   ONCE DAILY BEFORE A MEAL.  31 each  11  . ANTIVERT 12.5 MG tablet TAKE 1 TABLET BY MOUTH 3 TIMES DAILY AS NEEDED FOR DIZZINESS.  30 each  3  . BD INSULIN SYRINGE ULTRAFINE 31G X 5/16" 0.3 ML MISC USE AS DIRECTED.  100 each  3  . butalbital-acetaminophen-caffeine (FIORICET) 50-325-40 MG per tablet Take 1 tablet by mouth every 6 (six) hours as needed for headache.  30 tablet  2  . cloNIDine (CATAPRES) 0.3 MG tablet TAKE 1 TABLET BY MOUTH 3 TIMES DAILY: TAKE AT 8 A.M.;4 P.M.; AND MIDNIGHT.  90 tablet  3  . CRESTOR 10 MG tablet TAKE 1 TABLET BY MOUTH AT BEDTIME.  30 each  3  . diclofenac sodium (VOLTAREN) 1 % GEL Apply 1 application topically daily as needed. Pain       . ENDOCET 10-325  MG per tablet Take 1 tablet by mouth Every 8 hours as needed. Pain      . ergocalciferol (VITAMIN D2) 50000 UNITS capsule Take 50,000 Units by mouth once a week. Patient takes on Fridays.      [name] FLEXERIL 10 MG tablet TAKE 1 TABLET BY MOUTH TWICE DAILY.  60 each  3  . fluticasone (FLONASE) 50 MCG/ACT nasal spray Place 1 spray into both nostrils daily. Allergies      . furosemide (LASIX) 20 MG tablet Take 20 mg by mouth daily as needed. Swelling       . glipiZIDE (GLUCOTROL) 10 MG tablet TAKE 1 TABLET BY MOUTH TWICE DAILY.  60 tablet  3  . glucose blood test strip Twice daily testing  100 each  5  . hydrochlorothiazide (MICROZIDE) 12.5 MG capsule TAKE ONE CAPSULE DAILY.  30 capsule  3  . insulin glargine (LANTUS SOLOSTAR) 100 UNIT/ML injection Inject 30 Units into the skin at  bedtime.  10 mL  12  . lamoTRIgine (LAMICTAL) 100 MG tablet Take 100 mg by mouth daily.        . Lancets (ACCU-CHEK MULTICLIX) lancets Twice daily testing  100 each  5  . levothyroxine (LEVOXYL) 50 MCG tablet Take 50 mcg by mouth daily.        . Linaclotide (LINZESS) 145 MCG CAPS Take 145 mcg by mouth daily before breakfast.  30 capsule  0  . LORazepam (ATIVAN) 1 MG tablet Take 1 mg by mouth 4 (four) times daily.       . metFORMIN (GLUCOPHAGE) 1000 MG tablet Take 1 tablet (1,000 mg total) by mouth 2 (two) times daily with a meal.  60 tablet  11  . metoprolol (LOPRESSOR) 50 MG tablet Take 50 mg by mouth 2 (two) times daily.        . mirtazapine (REMERON) 30 MG tablet Take 30 mg by mouth at bedtime.       [name] nystatin (NYSTOP) 100000 UNIT/GM POWD        . ondansetron (ZOFRAN) 4 MG tablet Take 1 tablet (4 mg total) by mouth every 8 (eight) hours as needed for nausea.  30 tablet  1  . ONGLYZA 5 MG TABS tablet TAKE 1 TABLET BY MOUTH ONCE DAILY.  30 tablet  3  . PLAVIX 75 MG tablet TAKE ONE TABLET BY MOUTH ONCE DAILY.  30 each  3  . polyethylene glycol (MIRALAX / GLYCOLAX) packet Take 17 g by mouth 2 (two) times daily.      . potassium chloride SA (KLOR-CON M20) 20 MEQ tablet Take 20 mEq by mouth daily as needed. As needed with furosemide        . pregabalin (LYRICA) 75 MG capsule Take 75 mg by mouth 2 (two) times daily.        [name] SINGULAIR 10 MG tablet TAKE ONE TABLET DAILY.  30 each  3  . thiothixene (NAVANE) 2 MG capsule Take 2 mg by mouth at bedtime.          Allergies: Cephalexin; Milk-related compounds; Penicillins; and Phenazopyridine hcl History   Social History  . Marital Status: Divorced    Spouse Name: N/A    Number of Children: 1  . Years of Education: N/A   Occupational History  . disabled     Social History Main Topics  . Smoking status: Former Smoker    Quit date: 12/22/1992  . Smokeless tobacco: Not on file  . Alcohol Use: No  . Drug  Use: No  . Sexually Active: Not on  file   Other Topics Concern  . Not on file   Social History Narrative  . No narrative on file   Family History  Problem Relation Age of Onset  . Heart attack Mother   . Pneumonia Father   . Kidney failure Father   . Cancer Sister     pancreatic  . Diabetes Brother   . Hypertension Brother   . Colon cancer Neg Hx   . Anesthesia problems Neg Hx   . Hypotension Neg Hx   . Malignant hyperthermia Neg Hx   . Pseudochol deficiency Neg Hx    Past Surgical History  Procedure Date  . Abdominal hysterectomy   . Cholecystectomy   . Ovarian cyst removal   . Carpal tunnel release 07/22/04    left/ Dr. Romeo Apple   . Breast reduction surgery   . Bilateral cataract surgery   . Biopsy of thyroid gland 2009  . Surgical excision of 3 tumors from right thigh and right buttock  and left upper thigh 2010  . Back surgery July 2012  . Spine surgery 09/29/2010    dr brooks  . Maloney dilation 12/29/2010    Procedure: Elease Hashimoto DILATION;  Surgeon: Corbin Ade, MD;  Location: [facility];  Service: Endoscopy;  Laterality: N/A;  56, 58,  . Esophagogastroduodenoscopy 12/29/2010    Retained food in the esophagus and stomach, small hiatal hernia, status post dilation of the esophagus    Review of Systems: Systems reviewed and as per HPI, otherwise negative.  Physical Exam: Blood pressure 105/59, pulse 60, temperature 97.8 F (36.6 C), temperature source Oral, resp. rate 16, height 5\' 2"  (1.575 m), weight 78.4 kg (172 lb 13.5 oz), SpO2 98.00%. BP 105/59  Pulse 60  Temp(Src) 97.8 F (36.6 C) (Oral)  Resp 16  Ht 5\' 2"  (1.575 m)  Wt 78.4 kg (172 lb 13.5 oz)  BMI 31.61 kg/m2  SpO2 98%  General Appearance:    somnolent. Arousable only to deep sternal rub briefly then falls immediately back asleep   Head:    Normocephalic, without obvious abnormality, atraumatic  Eyes:    PERRL, conjunctiva/corneas clear, EOM's intact, fundi    benign, both eyes  Ears:    Normal TM's and external ear canals, both  ears  Nose:   Nares normal, septum midline, mucosa normal, no drainage    or sinus tenderness  Throat:   much food matter still in her mouth, partially chewed   Neck:   Supple, symmetrical, trachea midline, no adenopathy;    thyroid:  no enlargement/tenderness/nodules; no carotid   bruit or JVD  Back:     Symmetric, no curvature, ROM normal, no CVA tenderness  Lungs:     Clear to auscultation bilaterally, respirations unlabored  Chest Wall:    No tenderness or deformity   Heart:    Regular rate and rhythm, S1 and S2 normal, no murmur, rub   or gallop     Abdomen:     Soft, non-tender, bowel sounds active all four quadrants,    no masses, no organomegaly  Genitalia:   deferred   Rectal:   deferred   Extremities:   Extremities normal, atraumatic, no cyanosis or edema  Pulses:   2+ and symmetric all extremities  Skin:   Skin color, texture, turgor normal, no rashes or lesions  Lymph nodes:   Cervical, supraclavicular, and axillary nodes normal  Neurologic:   no obvious deficits, but patient  is unable to cooperate with exam.     Lab results: Basic Metabolic Panel:  Basename 04/27/11 1226  NA 138  K 4.5  CL 103  CO2 23  GLUCOSE 195*  BUN 28*  CREATININE 1.32*  CALCIUM 9.5  MG --  PHOS --   Liver Function Tests:  Basename 04/27/11 1239  AST 20  ALT 16  ALKPHOS 83  BILITOT 0.2*  PROT 7.2  ALBUMIN 3.9   No results found for this basename: LIPASE:2,AMYLASE:2 in the last 72 hours No results found for this basename: AMMONIA:2 in the last 72 hours CBC:  Basename 04/27/11 1226  WBC 8.5  NEUTROABS 5.2  HGB 9.8*  HCT 31.5*  MCV 73.1*  PLT 236   Cardiac Enzymes: No results found for this basename: CKTOTAL:3,CKMB:3,CKMBINDEX:3,TROPONINI:3 in the last 72 hours BNP: No results found for this basename: PROBNP:3 in the last 72 hours D-Dimer: No results found for this basename: DDIMER:2 in the last 72 hours CBG:  Basename 04/27/11 2221 04/27/11 1658 04/27/11 1205  GLUCAP  228* 123* 367*   Hemoglobin A1C: No results found for this basename: HGBA1C in the last 72 hours Fasting Lipid Panel: No results found for this basename: CHOL,HDL,LDLCALC,TRIG,CHOLHDL,LDLDIRECT in the last 72 hours Thyroid Function Tests: No results found for this basename: TSH,T4TOTAL,FREET4,T3FREE,THYROIDAB in the last 72 hours Anemia Panel: No results found for this basename: VITAMINB12,FOLATE,FERRITIN,TIBC,IRON,RETICCTPCT in the last 72 hours Coagulation: No results found for this basename: LABPROT:2,INR:2 in the last 72 hours Urine Drug Screen: Drugs of Abuse     Component Value Date/Time   LABOPIA NONE DETECTED 04/27/2011 1228   COCAINSCRNUR NONE DETECTED 04/27/2011 1228   LABBENZ POSITIVE* 04/27/2011 1228   AMPHETMU POSITIVE* 04/27/2011 1228   THCU NONE DETECTED 04/27/2011 1228   LABBARB NONE DETECTED 04/27/2011 1228    Alcohol Level:  Basename 04/27/11 1500  ETH <11   Urinalysis:  Basename 04/27/11 1228  COLORURINE YELLOW  LABSPEC >1.030*  PHURINE 5.5  GLUCOSEU NEGATIVE  HGBUR TRACE*  BILIRUBINUR NEGATIVE  KETONESUR TRACE*  PROTEINUR NEGATIVE  UROBILINOGEN 0.2  NITRITE NEGATIVE  LEUKOCYTESUR NEGATIVE    Imaging results:  Ct Head Wo Contrast  04/27/2011  *RADIOLOGY REPORT*  Clinical Data: Altered mental status.  Lethargic.  Diabetes and hypertension.  CT HEAD WITHOUT CONTRAST  Technique:  Contiguous axial images were obtained from the base of the skull through the vertex without contrast.  Comparison: CT 12/12/2010  Findings: Ventricle size is normal.  Negative for hemorrhage.  No acute infarct.  Small calcifications are present above the cribriform plate.  There may be associated with hyperdensity in this area.  This is unchanged from the prior study.  This could represent a mass lesion such as meningioma.  No significant brain edema is present. Further evaluation with MRI is suggested.  Calvarium is intact.  Paranasal sinuses are clear.  IMPRESSION: Negative for acute  infarct.  Small calcifications above the cribriform plate are unchanged from the  prior study.  Suspicion for underlying meningioma.  MRI brain without and with contrast is suggested for further evaluation.  Original Report Authenticated By: Camelia Phenes, M.D.   Dg Chest Port 1 View  04/27/2011  *RADIOLOGY REPORT*  Clinical Data: Altered mental status.  PORTABLE CHEST - 1 VIEW  Comparison: Chest x-ray 08/23/2010.  Findings: Lung volumes are very low.  No definite consolidative airspace disease.  No definite pleural effusions.  Pulmonary vascular crowding (accentuated by low lung volumes), without frank pulmonary edema. The patient is rotated to  the right on today's exam, resulting in distortion of the mediastinal contours and reduced diagnostic sensitivity and specificity for mediastinal pathology.  Heart size appears borderline enlarged mildly enlarged (likely accentuated by low lung volumes).  IMPRESSION: 1.  Low lung volumes without definite radiographic evidence of acute cardiopulmonary disease.  Original Report Authenticated By: Florencia Reasons, M.D.    Assessment & Plan: Active Problems:  Altered mental status  Polypharmacy  DIABETES MELLITUS  ANEMIA  HYPERTENSION  Bipolar disorder  HYPERLIPIDEMIA  OBESITY  GERD  BACK PAIN, CHRONIC  SLEEP APNEA  Constipation  Renal insufficiency  Most likely this is related to polypharmacy. Will need to rule out intentional overdose. Patient's medications should be cut back as able, and regimen simplified. We will need to inquire further about her urine drug screen showing amphetamine. Certainly, this is concerning for recreational drug/pill use. Also, there is some sketchy history for seizure, though she has had none for 3 years and there was no witnessed seizure. It's possible she may be post ictal. She will be monitored and step down. Hold all medications for now. Keep her n.p.o. until she is more alert.  Mischa Brittingham L 04/28/2011, 7:55  AM

## 2011-05-01 ENCOUNTER — Telehealth: Payer: Self-pay | Admitting: Family Medicine

## 2011-05-01 NOTE — Telephone Encounter (Signed)
Sent for PA form. Will fax it over

## 2011-05-02 NOTE — Telephone Encounter (Signed)
Spoke with patient and she couldn't remember why she called. York Spaniel she was coming in Thursday and I told her we would discuss it then

## 2011-05-04 ENCOUNTER — Encounter: Payer: Self-pay | Admitting: Family Medicine

## 2011-05-04 ENCOUNTER — Ambulatory Visit (INDEPENDENT_AMBULATORY_CARE_PROVIDER_SITE_OTHER): Payer: Medicare Other | Admitting: Family Medicine

## 2011-05-04 VITALS — BP 136/76 | HR 68 | Resp 18 | Ht 62.0 in | Wt 159.0 lb

## 2011-05-04 DIAGNOSIS — M79609 Pain in unspecified limb: Secondary | ICD-10-CM

## 2011-05-04 DIAGNOSIS — I10 Essential (primary) hypertension: Secondary | ICD-10-CM

## 2011-05-04 DIAGNOSIS — M25569 Pain in unspecified knee: Secondary | ICD-10-CM

## 2011-05-04 DIAGNOSIS — E119 Type 2 diabetes mellitus without complications: Secondary | ICD-10-CM

## 2011-05-04 DIAGNOSIS — R4182 Altered mental status, unspecified: Secondary | ICD-10-CM

## 2011-05-04 DIAGNOSIS — Z760 Encounter for issue of repeat prescription: Secondary | ICD-10-CM

## 2011-05-04 DIAGNOSIS — M25561 Pain in right knee: Secondary | ICD-10-CM

## 2011-05-04 DIAGNOSIS — Z79899 Other long term (current) drug therapy: Secondary | ICD-10-CM

## 2011-05-04 DIAGNOSIS — M79641 Pain in right hand: Secondary | ICD-10-CM

## 2011-05-04 DIAGNOSIS — K219 Gastro-esophageal reflux disease without esophagitis: Secondary | ICD-10-CM

## 2011-05-04 NOTE — Patient Instructions (Signed)
F/U first week in April  Continue to STOP fioricet, clonidine , hydrochlorothiazide, methocarbamol   Let your home health nurse put out your medication each week. The bubble pack is not working  HBA1C and chem 7 in March before visit  Please keep appt with mental health.  I will send a note to your pain clinic

## 2011-05-09 ENCOUNTER — Telehealth: Payer: Self-pay | Admitting: Family Medicine

## 2011-05-15 ENCOUNTER — Telehealth: Payer: Self-pay | Admitting: Family Medicine

## 2011-05-16 NOTE — Telephone Encounter (Signed)
She wants a referral to Dr. Diamantina Providence

## 2011-05-16 NOTE — Telephone Encounter (Signed)
I don't understand what the message is asking. Called pt back- no answer

## 2011-05-16 NOTE — Telephone Encounter (Signed)
She has been referred for hand pain and knee pain. Each will be evaluated at separate appts

## 2011-05-19 ENCOUNTER — Ambulatory Visit: Payer: Medicare Other | Attending: Neurology | Admitting: Sleep Medicine

## 2011-05-19 DIAGNOSIS — Z6830 Body mass index (BMI) 30.0-30.9, adult: Secondary | ICD-10-CM | POA: Insufficient documentation

## 2011-05-19 DIAGNOSIS — G4730 Sleep apnea, unspecified: Secondary | ICD-10-CM

## 2011-05-19 DIAGNOSIS — G4733 Obstructive sleep apnea (adult) (pediatric): Secondary | ICD-10-CM | POA: Insufficient documentation

## 2011-05-21 NOTE — Progress Notes (Signed)
  Subjective:    Patient ID: [name], female    DOB: 1951-01-05, 61 y.o.   MRN: [id number]  HPI Pt in for f/u of recent hospitalization for altered mental status, due to inappropriate medication administration unintentionally. States she is happy to be home, refuses placement in a home, understands the need to take medication only as prescribed and her nurse is to put it out rather than rely on the bubble packaging. Denies uncontrolled depression or hallucinations   Review of Systems See HPI .  Denies recent fever or chills. Denies sinus pressure, nasal congestion, ear pain or sore throat. Denies chest congestion, productive cough or wheezing. Denies chest pains, palpitations and leg swelling Denies abdominal pain, nausea, vomiting,diarrhea or constipation.   Denies dysuria, frequency, hesitancy or incontinence. Chronic back pain Denies headaches, seizures, numbness, or tingling. Denies uncontrolled  depression, anxiety or insomnia. Denies skin break down or rash.     Objective:   Physical Exam Patient alert and oriented and in no cardiopulmonary distress.  HEENT: No facial asymmetry, EOMI, no sinus tenderness,  oropharynx pink and moist.  Neck supple no adenopathy.  Chest: Clear to auscultation bilaterally.  CVS: S1, S2 no murmurs, no S3.  ABD: Soft non tender. Bowel sounds normal.  Ext: No edema  WU:[id number]  ROM spine, shoulders, hips and knees.  Skin: Intact, no ulcerations or rash noted.  Psych: Good eye contact, normal affect. Memory intact not anxious or depressed appearing.  CNS: CN 2-12 intact, power, tone and sensation normal throughout.        Assessment & Plan:

## 2011-05-21 NOTE — Assessment & Plan Note (Signed)
Appropriate mentation currently, required hospitalization and med adjustments. Though would do well in a home, pt adamantly refuses

## 2011-05-21 NOTE — Assessment & Plan Note (Signed)
Uncontrolled, importance of following a low carb diet is stressed

## 2011-05-21 NOTE — Assessment & Plan Note (Signed)
C/o increased hand pain

## 2011-05-21 NOTE — Assessment & Plan Note (Signed)
Controlled, no change in medication  

## 2011-05-21 NOTE — Assessment & Plan Note (Signed)
Discontinuation of several medications to continue and improved supervision by home health nurse

## 2011-05-23 ENCOUNTER — Encounter: Payer: Self-pay | Admitting: Orthopedic Surgery

## 2011-05-23 ENCOUNTER — Ambulatory Visit (INDEPENDENT_AMBULATORY_CARE_PROVIDER_SITE_OTHER): Payer: Medicare Other | Admitting: Orthopedic Surgery

## 2011-05-23 VITALS — BP 130/60 | Ht 62.0 in | Wt 159.0 lb

## 2011-05-23 DIAGNOSIS — G5601 Carpal tunnel syndrome, right upper limb: Secondary | ICD-10-CM

## 2011-05-23 DIAGNOSIS — M79609 Pain in unspecified limb: Secondary | ICD-10-CM

## 2011-05-23 DIAGNOSIS — M79641 Pain in right hand: Secondary | ICD-10-CM

## 2011-05-23 DIAGNOSIS — G5600 Carpal tunnel syndrome, unspecified upper limb: Secondary | ICD-10-CM

## 2011-05-23 DIAGNOSIS — M5030 Other cervical disc degeneration, unspecified cervical region: Secondary | ICD-10-CM

## 2011-05-23 HISTORY — DX: Carpal tunnel syndrome, right upper limb: G56.01

## 2011-05-23 NOTE — Patient Instructions (Signed)
NCS

## 2011-05-23 NOTE — Progress Notes (Signed)
  Subjective:    [name] is a 61 y.o. female who presents for evaluation of RIGHT upper extremity pain radiating from the neck area down into the RIGHT hand.  She had a carpal tunnel release back in 2006 she is on Lyrica she is a diabetic she does have some neck discomfort as well.  She actually discussed her bilateral leg pain and back pain more than she discussed her carpal tunnel symptoms.  She had a lumbar disc procedure of some kind by Dr. Shon Baton and he released her.  As far as her hand goes she complains of throbbing swelling tingling and pain radiating from her neck down into her RIGHT hand with some weakness.  Review of systems as I said lumbar disc pain back pain leg pain numbness tingling after standing.  She also has some nervousness and depression dizziness nausea excessive thirst the remaining systems were reviewed are reported normal. The following portions of the patient's history were reviewed and updated as appropriate: allergies, current medications, past family history, past medical history, past social history, past surgical history and problem list.  Review of Systems Pertinent items are noted in HPI.    Objective:    BP 130/60  Ht 5\' 2"  (1.575 m)  Wt 159 lb (72.122 kg)  BMI 29.08 kg/m2  Vital signs are stable as recorded  General appearance is normal  The patient is alert and oriented x3  The patient's mood and affect are normal  Gait assessment: she is using a walker but can weight-bear as tolerated and is independent with gait  The RIGHT upper extremity has a well-healed volar incision from a carpal tunnel release.  She can open and close the hand extending the wrist flexor wrist, extend and flex the elbow and abduct the shoulder up well past 90  She has a flexed neck consistent with degenerative cervical disc disease with decreased range of motion in all planes and some pain with the Spurling's maneuver  Ulcer perfusion are normal and the RIGHT upper  extremity.  I did not detect any neurologic deficit.  The lymphatic system is negative for palpable lymph nodes  There are no pathologic reflexes. Reflexes are otherwise normal             Assessment:  Cervical disc disease with radiculopathy vs. Recurrent carpal tunnel syndrome RIGHT upper extremity   Plan:   Recommend nerve conduction studies to delineate its her symptoms.  I advised her to see her neurosurgeon regarding her back pain and bilateral leg pain

## 2011-05-26 IMAGING — CT CT HEAD W/O CM
1 of 2 series · 15 of 30 positions shown, 19 images · non-contrast
Comparison: 05/29/2009

CLINICAL DATA: Fell.  Trauma to the left side of the head.

CT HEAD WITHOUT CONTRAST
TECHNIQUE: Contiguous axial images were obtained from the base of
the skull through the vertex without contrast.

[Series 3: headtrauma 4.8 h37s · axial · 0.46mm/px · z∈[+34,+159]mm · 15 of 30 slices shown, 19 images]
[im 2/30  brain]
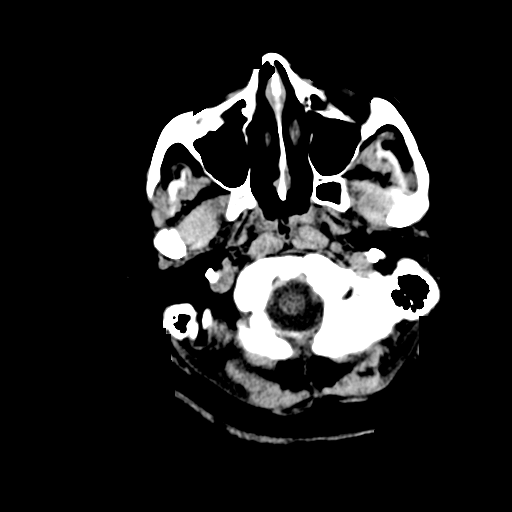
[im 2/30  bone]
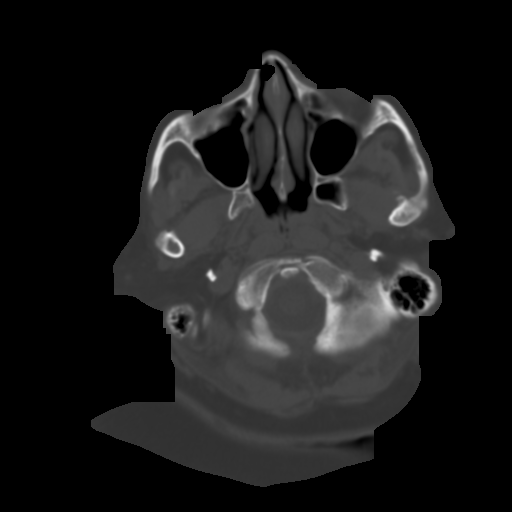
[im 4/30  brain]
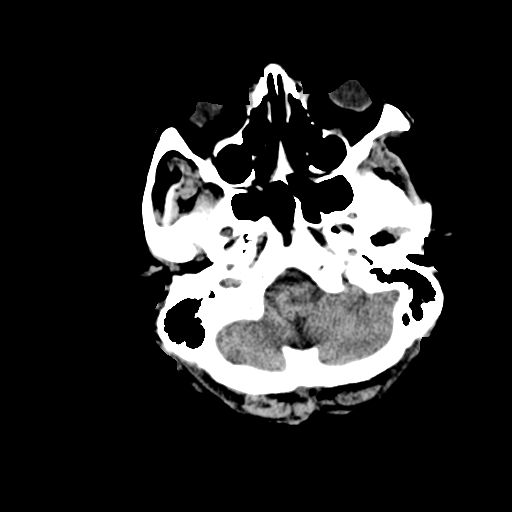
[im 6/30  brain]
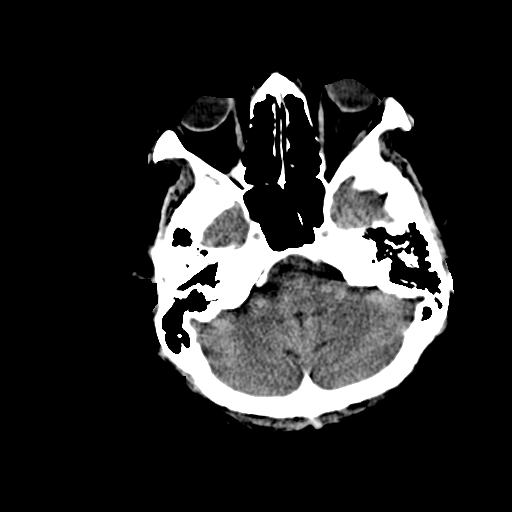
[im 8/30  brain]
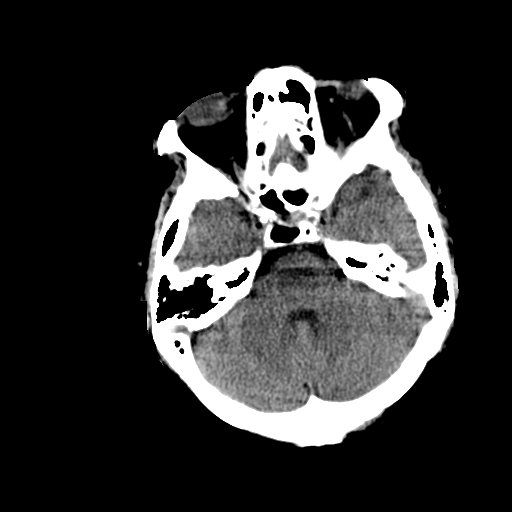
[im 10/30  brain]
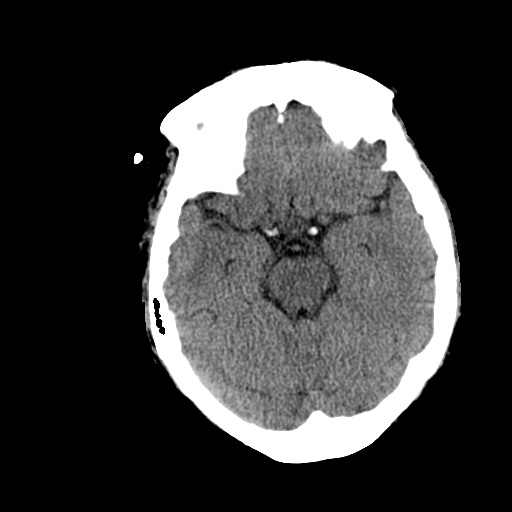
[im 10/30  bone]
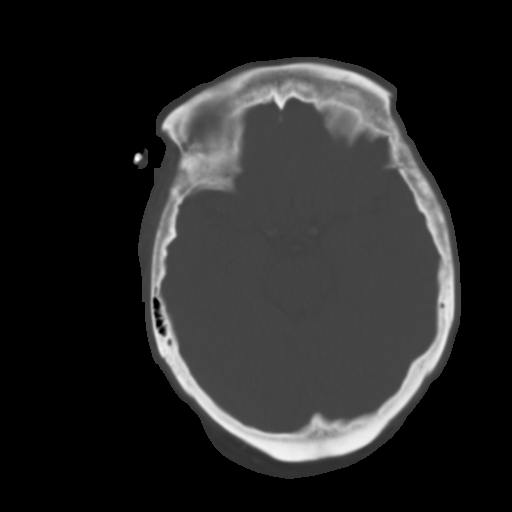
[im 11/30  brain]
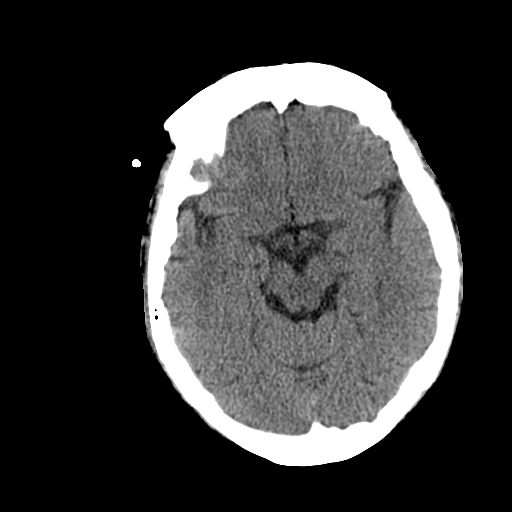
[im 13/30  brain]
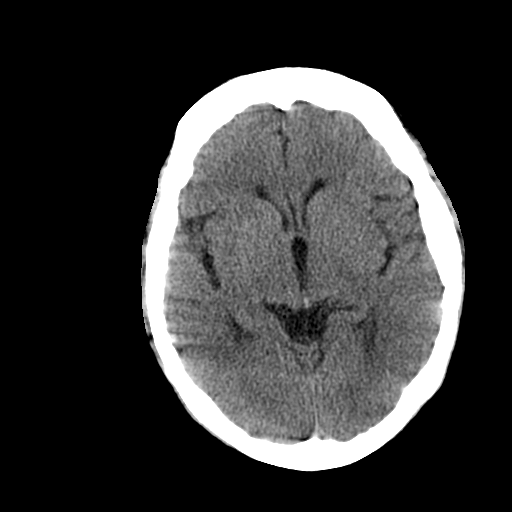
[im 15/30  brain]
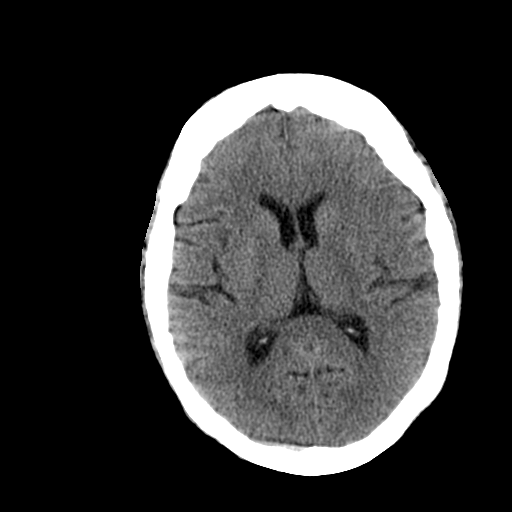
[im 17/30  brain]
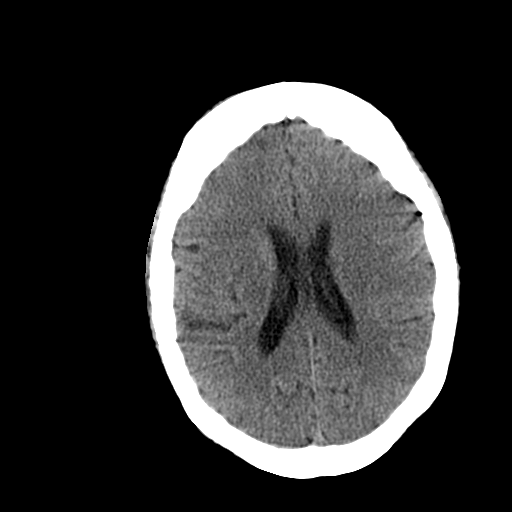
[im 17/30  bone]
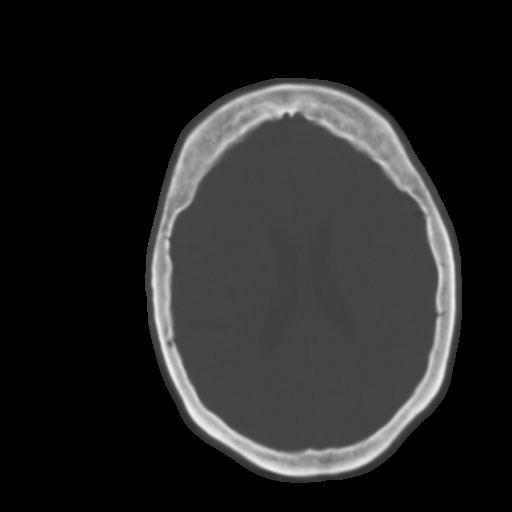
[im 19/30  brain]
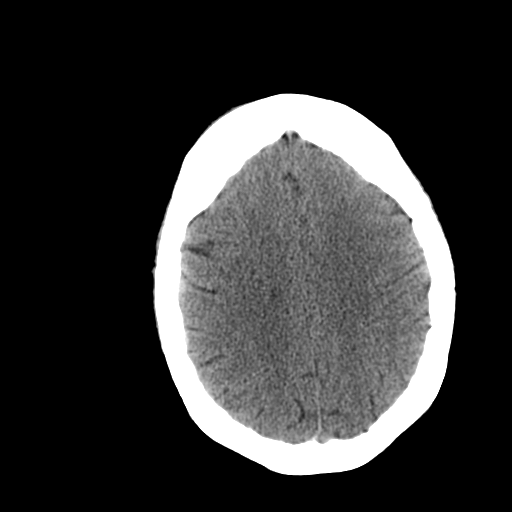
[im 20/30  brain]
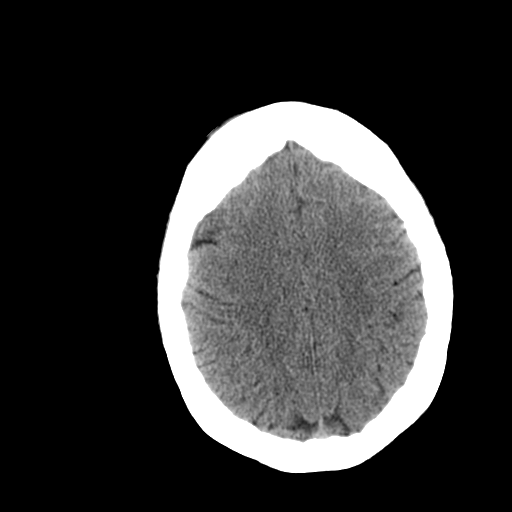
[im 22/30  brain]
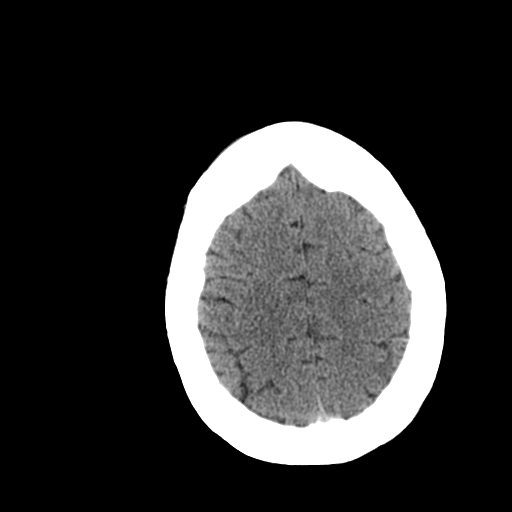
[im 24/30  brain]
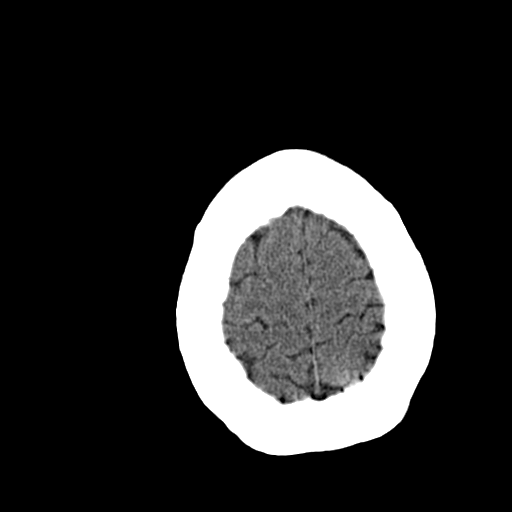
[im 24/30  bone]
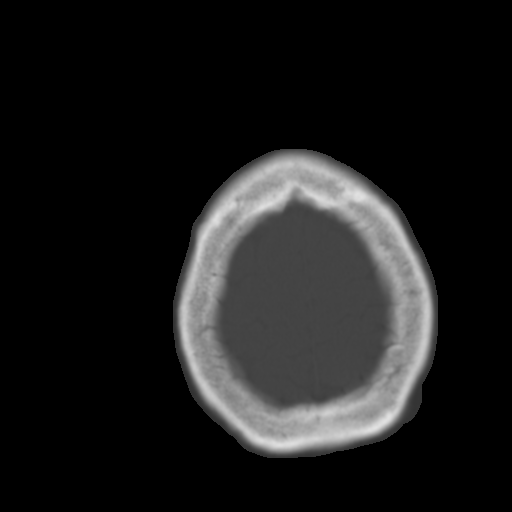
[im 26/30  brain]
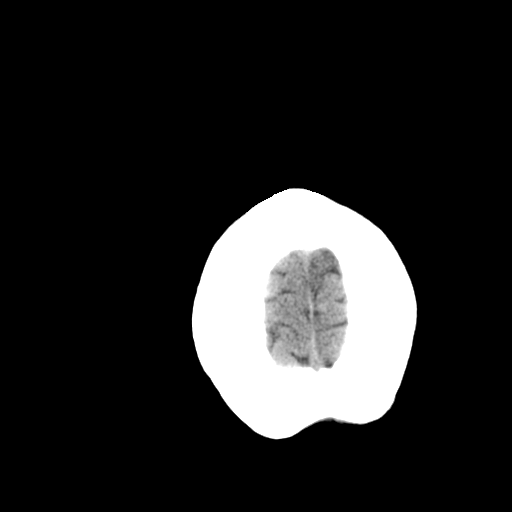
[im 28/30  brain]
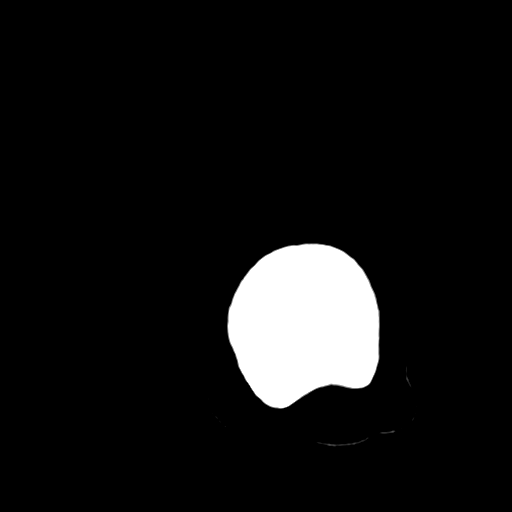

[15 of 30 positions shown; findings below may reference images not displayed]

FINDINGS: There is chronic low density in the pons related chronic
small vessel disease.  Otherwise, the brain has a normal appearance
without evidence of acute infarction, mass lesion, hemorrhage,
hydrocephalus or extra-axial collection.  No fluid in the sinuses,
middle ears or mastoids.  No skull fracture.  There appears to be
mild frontal scalp swelling on the right. There is atherosclerotic
calcification of the major vessels at the base the brain.
IMPRESSION: No evidence of intracranial injury or skull fracture.  Mild scalp
swelling on the right. Chronic small vessel change of the pons.

## 2011-05-26 IMAGING — CR DG HIP (WITH OR WITHOUT PELVIS) 2-3V*L*
3 series · 3 of 3 positions shown · non-contrast
Comparison: 07/16/2008

CLINICAL DATA: Fall.  Left hip pain.

LEFT HIP - COMPLETE 2+ VIEW

[view not recorded (1 of 3)]
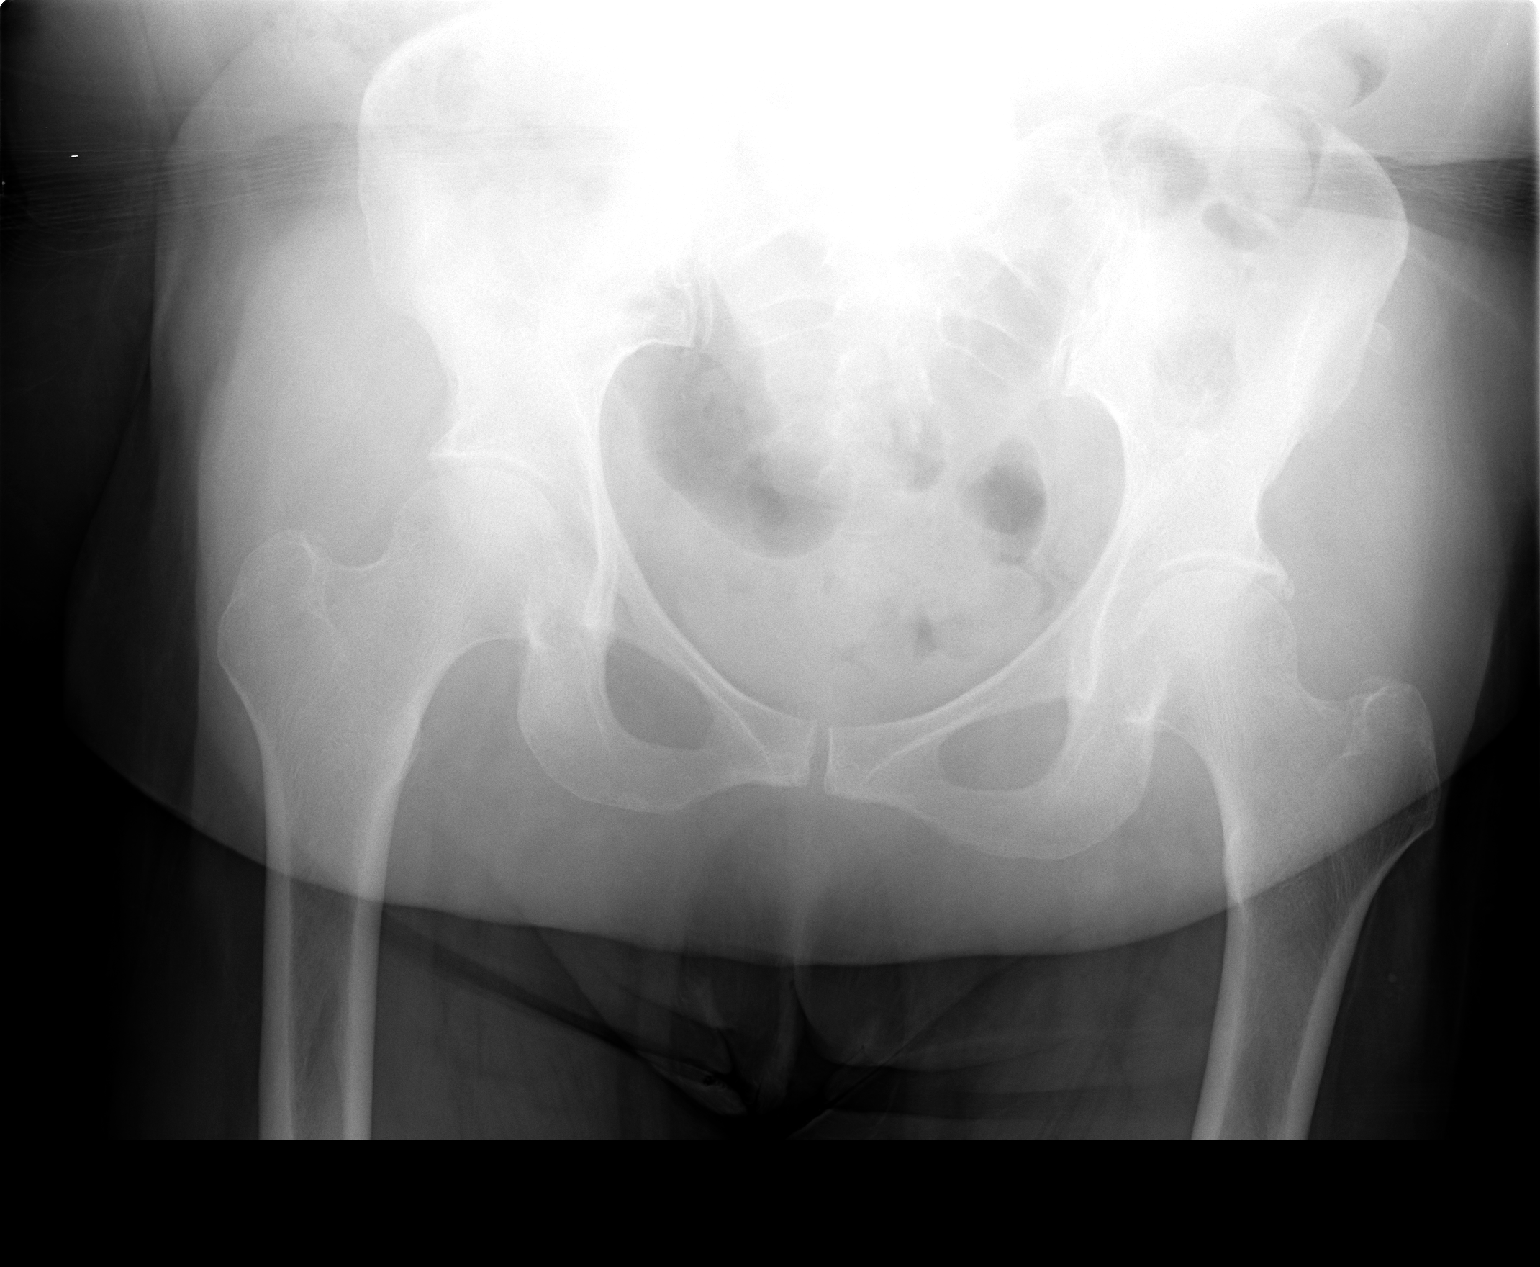

[view not recorded (2 of 3)]
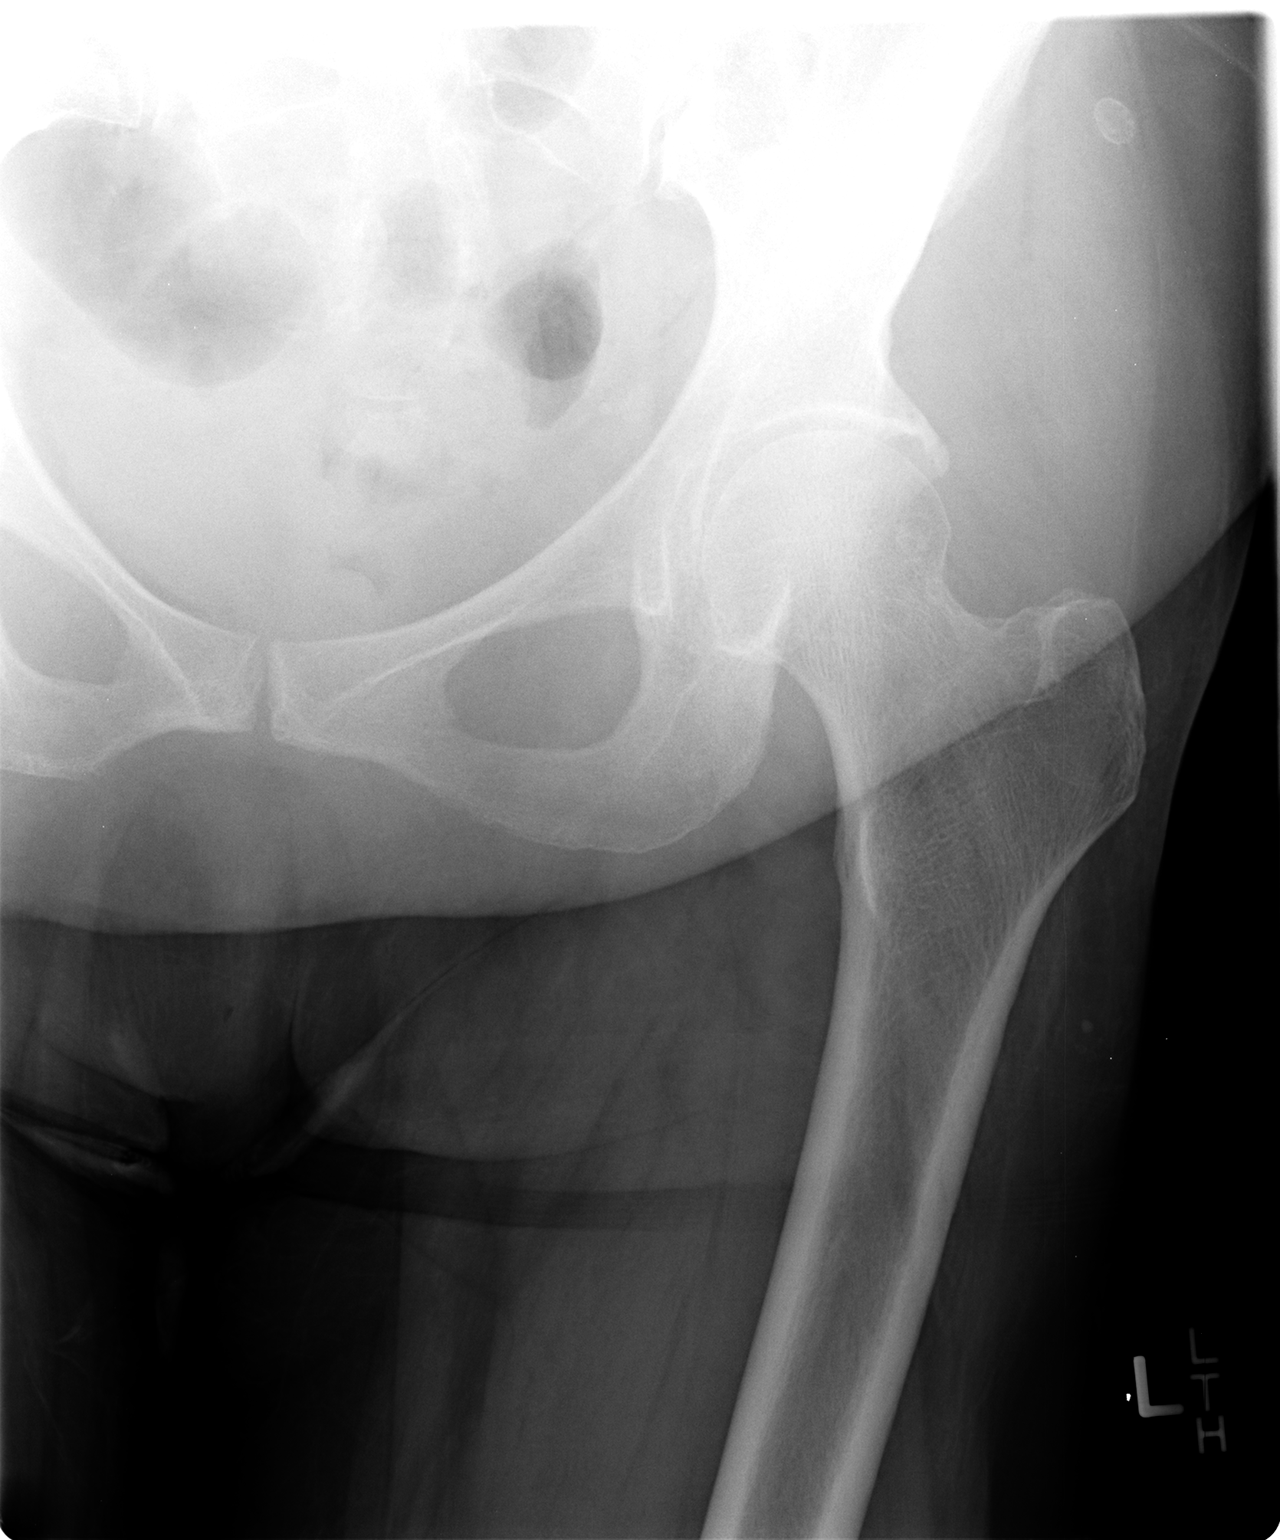

[view not recorded (3 of 3)]
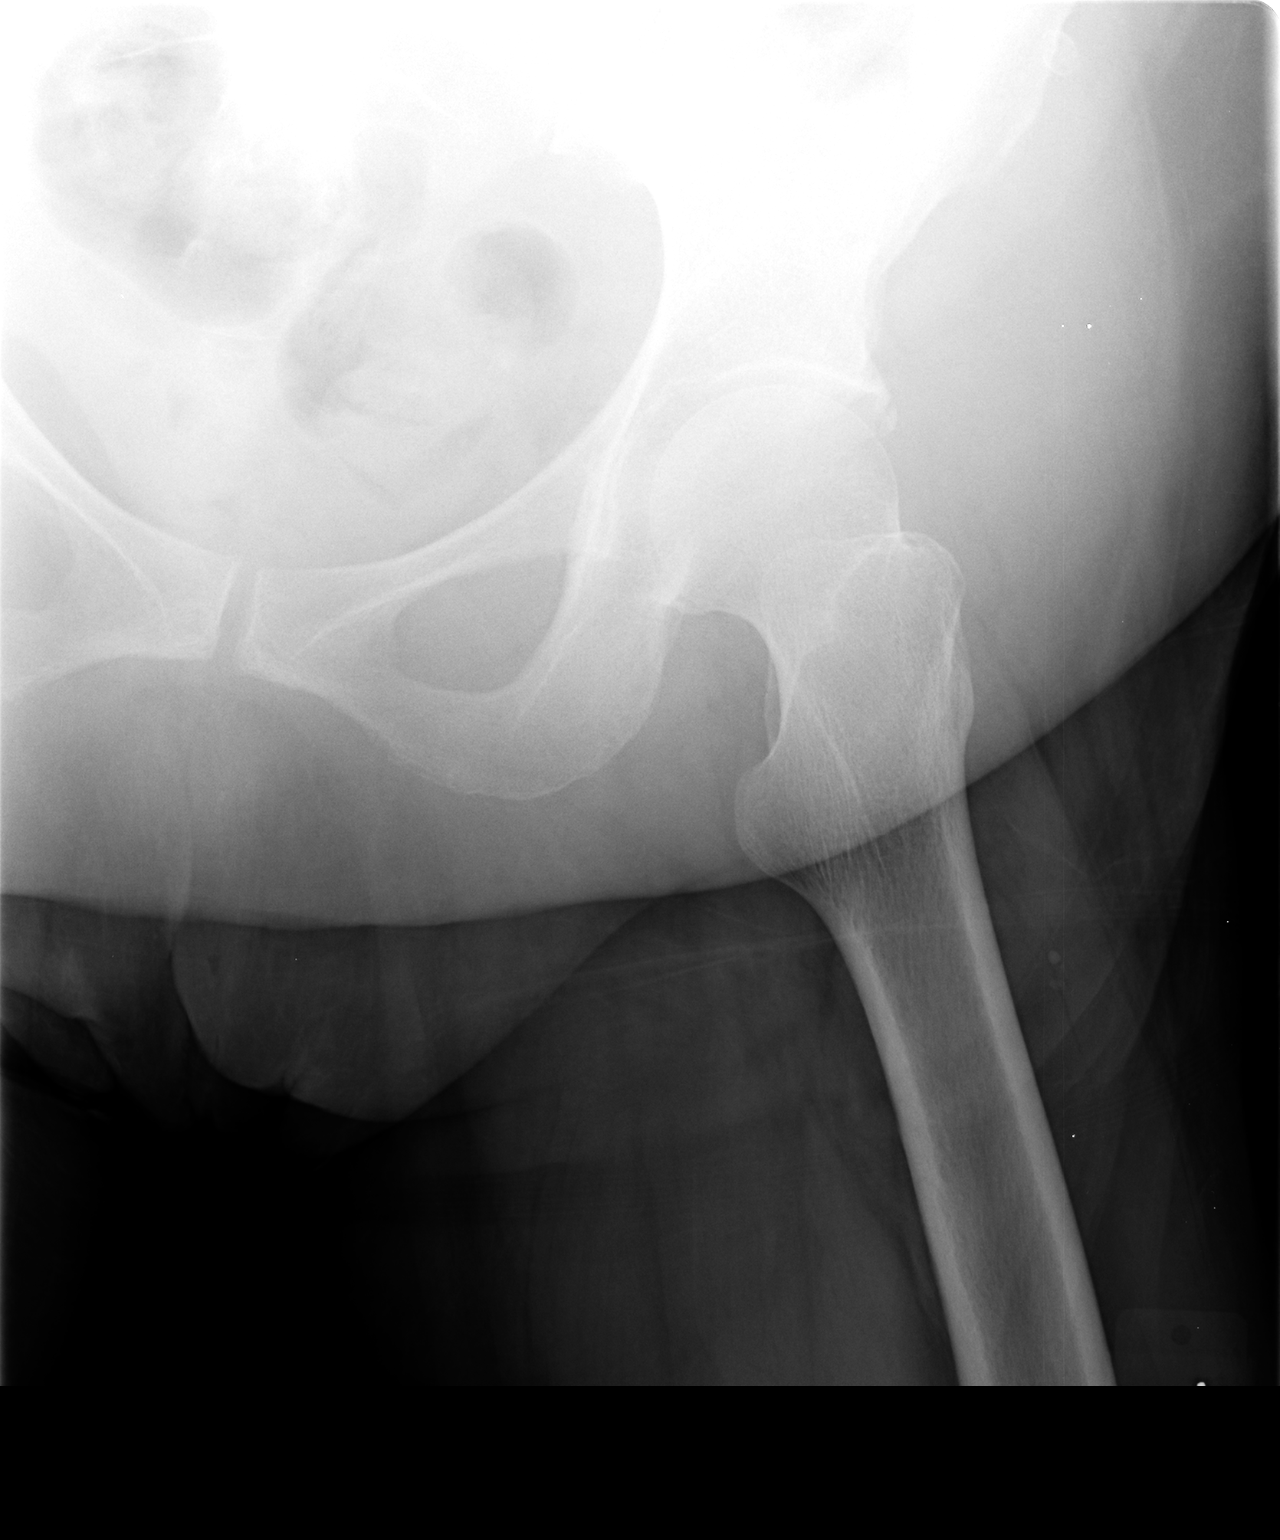

[3 of 3 positions shown; findings below may reference images not displayed]

FINDINGS: Mild acetabular spurring appears chronic and stable.

I do not discern a hip fracture or a regional pelvic fracture.
IMPRESSION: 1.  No significant abnormality identified.

## 2011-06-01 ENCOUNTER — Telehealth: Payer: Self-pay

## 2011-06-01 DIAGNOSIS — E785 Hyperlipidemia, unspecified: Secondary | ICD-10-CM

## 2011-06-01 NOTE — Telephone Encounter (Signed)
Will mail new lab order to patient

## 2011-06-04 NOTE — Procedures (Signed)
NAME:  [name], [name]                  ACCOUNT NO.:  [id number]  MEDICAL RECORD NO.:  [id number]          PATIENT TYPE:  OUT  LOCATION:  SLEEP LAB                     FACILITY:  [facility]  PHYSICIAN:  Dimitri Shakespeare A. Gerilyn Pilgrim, M.D. DATE OF BIRTH:  Sep 09, 1950  DATE OF STUDY:  05/19/2011                           NOCTURNAL POLYSOMNOGRAM   REFERRING PHYSICIAN:  Musab Wingard A. Gerilyn Pilgrim, M.D.  INDICATIONS:  This 61 year old presents with marked hypersomnia, snoring, and fatigue.  The study is being done to evaluate for obstructive sleep apnea syndrome.  MEDICATIONS:  Flexeril, Singulair, Atrovent, Fioricet, Lomotil, Synthroid, metformin, insulin, metoprolol, Navane , Aciphex, hydrochlorothiazide, Zoloft, baclofen, glimepiride.  EPWORTH SLEEPINESS SCALE: 1. BMI 30.  ARCHITECTURAL SUMMARY:  The total recording time is 452 minutes.  Sleep efficiency 87%.  Sleep latency 5.5 minutes.  REM latency 67.5 minutes. Stage N1 11.4%, N2 63%, and N3 is 4.2%.  REM sleep 21%.  RESPIRATORY SUMMARY:  Baseline oxygen saturation 96%, lowest saturation 85% during REM sleep.  Diagnostic AHI is 14 and RDI 15.  The events occurred almost exclusively during REM sleep with a REM AHI being 53.  LIMB MOVEMENT SUMMARY:  PLM index 7.5.  ELECTROCARDIOGRAM SUMMARY:  Average heart rate 82 with no significant dysrhythmias observed.  IMPRESSION: 1. Moderate obstructive sleep apnea syndrome worse during REM sleep. 2. Mild periodic limb movement disorder sleep.  RECOMMENDATIONS:  AutoSet or formal titration study.   Kayin Osment A. Gerilyn Pilgrim, M.D.    KAD/MEDQ  D:  06/04/2011 16:53:43  T:  06/04/2011 17:07:05  Job:  [id number]

## 2011-06-12 ENCOUNTER — Telehealth: Payer: Self-pay | Admitting: Family Medicine

## 2011-06-12 ENCOUNTER — Other Ambulatory Visit: Payer: Self-pay | Admitting: Family Medicine

## 2011-06-12 MED ORDER — LORATADINE 10 MG PO TBDP: 10.0000 mg | ORAL_TABLET | Freq: Every day | ORAL | Status: DC

## 2011-06-12 NOTE — Telephone Encounter (Signed)
Advise pt loratidine sent in and she needs to continue the fluticasone pls

## 2011-06-13 ENCOUNTER — Encounter: Payer: Self-pay | Admitting: Orthopedic Surgery

## 2011-06-13 ENCOUNTER — Telehealth: Payer: Self-pay | Admitting: Family Medicine

## 2011-06-13 NOTE — Telephone Encounter (Signed)
Called pt no answer °

## 2011-06-14 ENCOUNTER — Other Ambulatory Visit: Payer: Self-pay

## 2011-06-14 MED ORDER — FLUTICASONE PROPIONATE 50 MCG/ACT NA SUSP: 1.0000 | Freq: Every day | NASAL | Status: DC

## 2011-06-14 NOTE — Telephone Encounter (Signed)
Needs refill of flonase. Done

## 2011-06-14 NOTE — Telephone Encounter (Signed)
Pt aware.

## 2011-06-16 LAB — LIPID PANEL
HDL: 44 mg/dL (ref >39)
LDL Cholesterol: 59 mg/dL (ref 0–99)
Triglycerides: 140 mg/dL (ref <150)

## 2011-06-16 LAB — HEMOGLOBIN A1C
Hgb A1c MFr Bld: 6 % — ABNORMAL HIGH (ref <5.7)
Mean Plasma Glucose: 126 mg/dL — ABNORMAL HIGH (ref <117)

## 2011-06-16 LAB — BASIC METABOLIC PANEL
Potassium: 4.3 mEq/L (ref 3.5–5.3)
Sodium: 141 mEq/L (ref 135–145)

## 2011-06-16 IMAGING — CR DG LUMBAR SPINE COMPLETE 4+V
5 series · 5 of 5 positions shown · non-contrast
Comparison: None.

CLINICAL DATA: Recent fall.  Left back pain.

LUMBAR SPINE - COMPLETE 4+ VIEW

[view not recorded (1 of 5)]
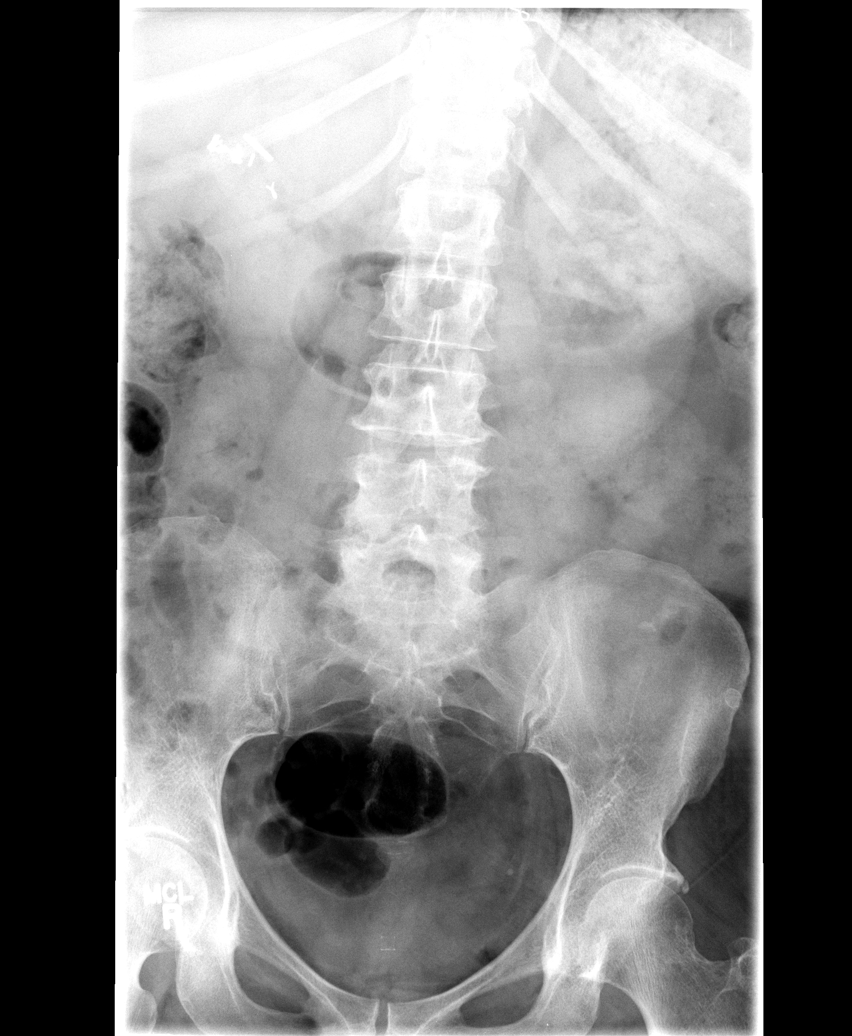

[view not recorded (2 of 5)]
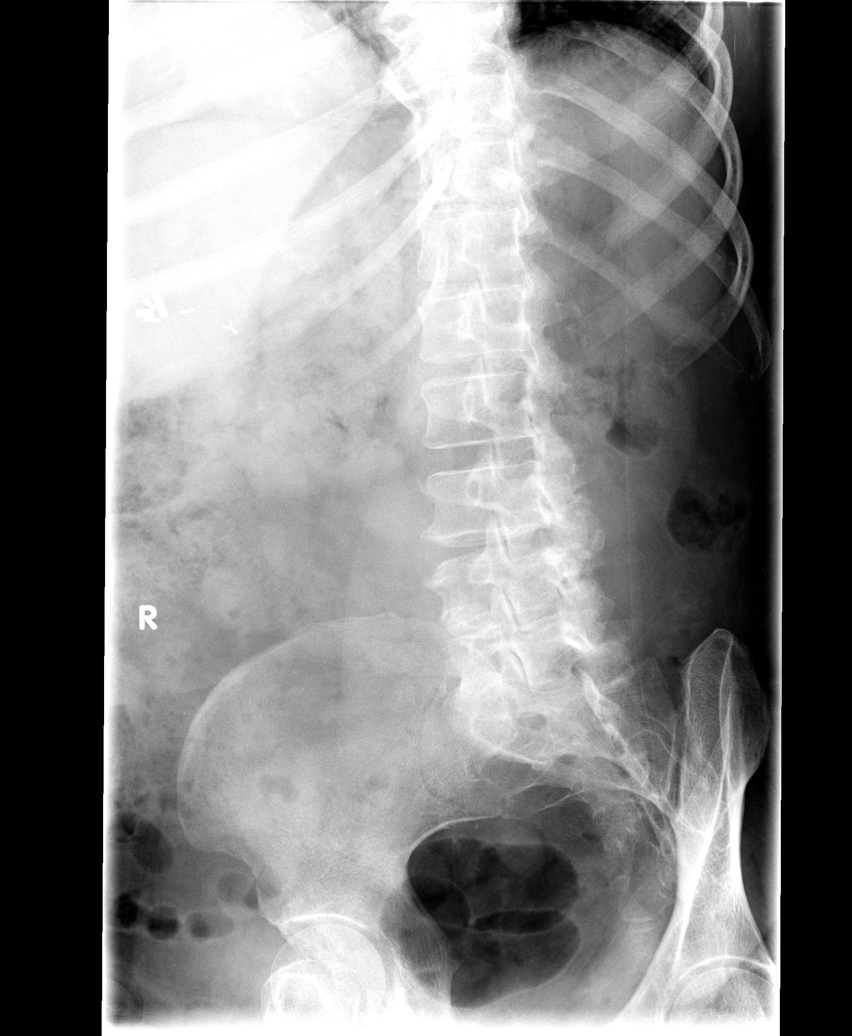

[view not recorded (3 of 5)]
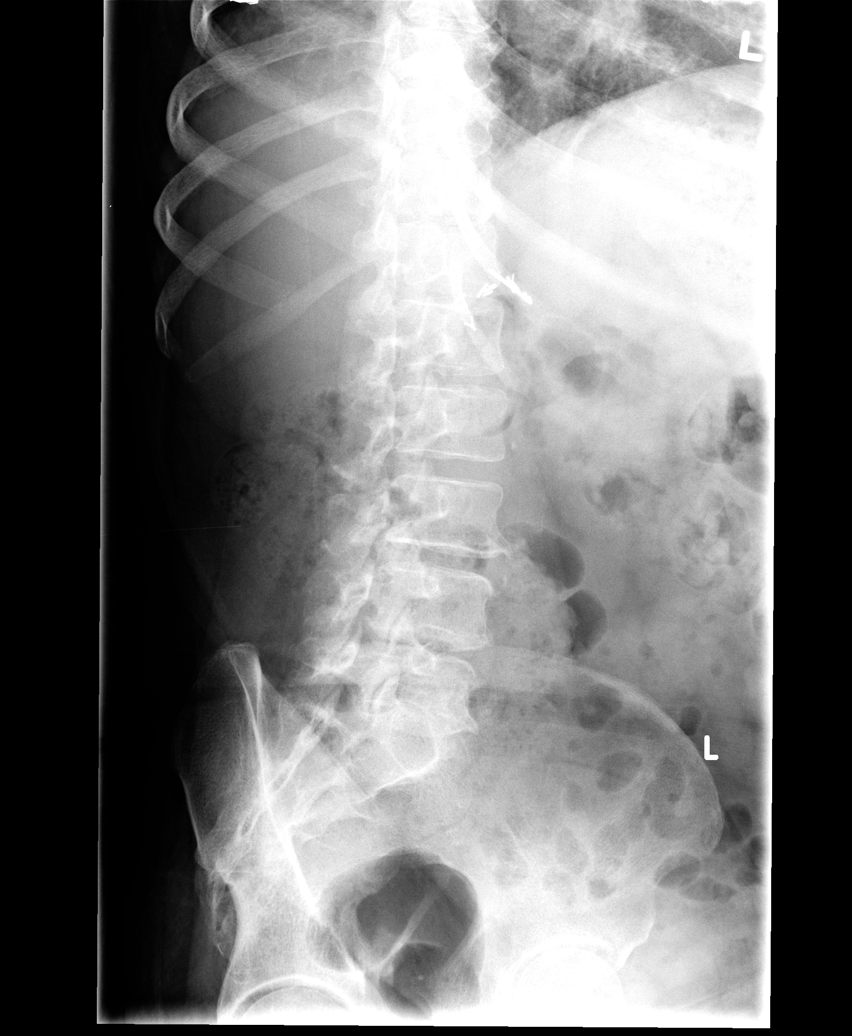

[view not recorded (4 of 5)]
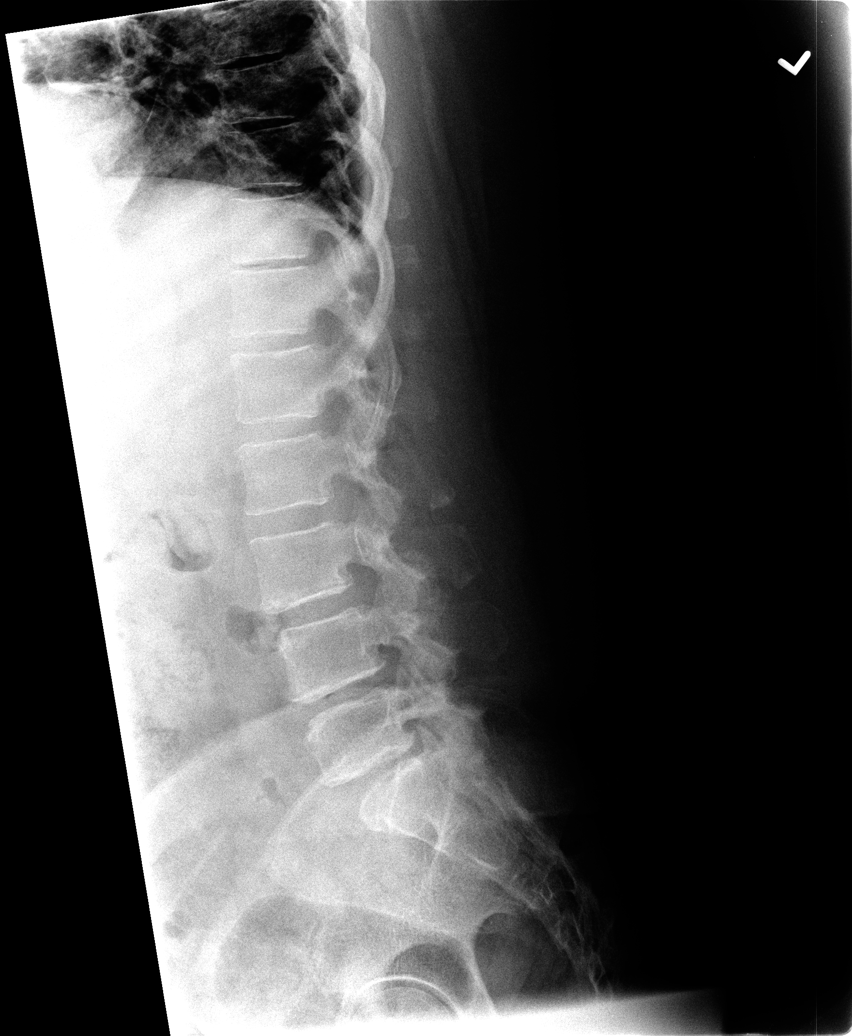

[view not recorded (5 of 5)]
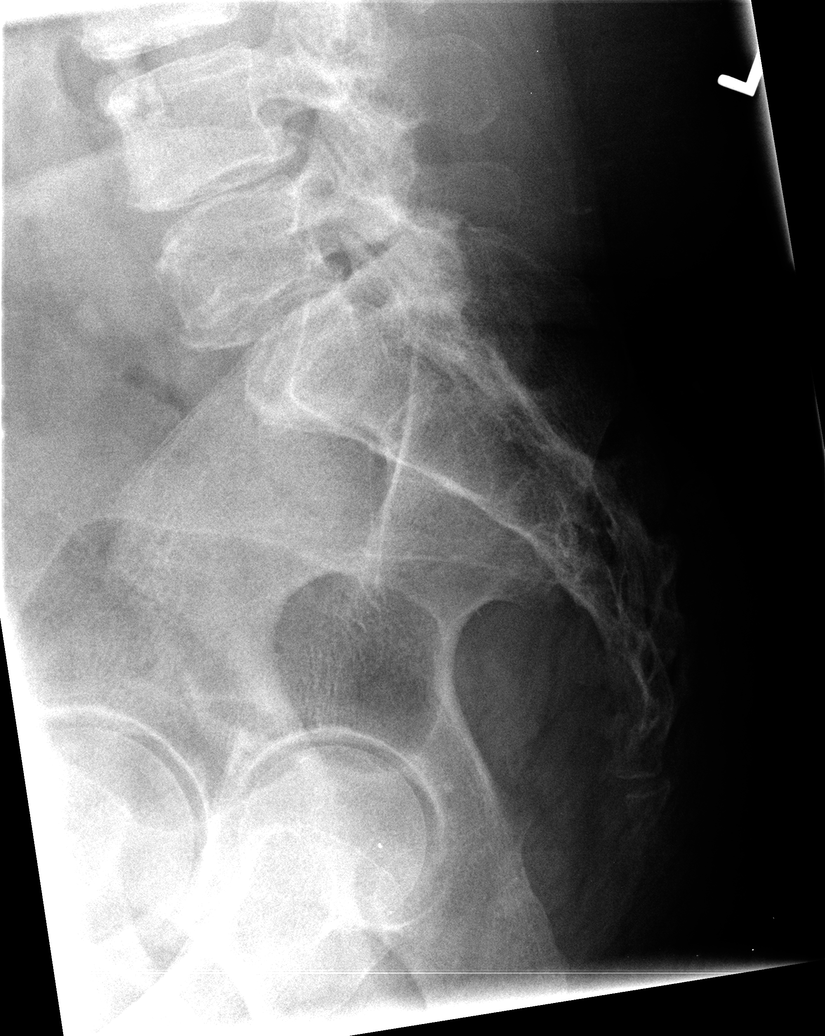

[5 of 5 positions shown; findings below may reference images not displayed]

FINDINGS: No evidence of lumbar spine fracture, spondylolysis, or
spondylolisthesis.

Mild degenerative disc disease is seen at levels of L3-4, and L4-5.
No evidence of facet arthropathy other significant bone
abnormality.
IMPRESSION: 1.  No acute findings.
2.  Mild degenerative disc disease at L3-4 and L4-5.

## 2011-06-16 IMAGING — CR DG THORACIC SPINE 2V
2 series · 2 of 2 positions shown · non-contrast
Comparison: None.

CLINICAL DATA: Fall.  Thoracic back injury and pain.

THORACIC SPINE - 2 VIEW

[view not recorded (1 of 2)]
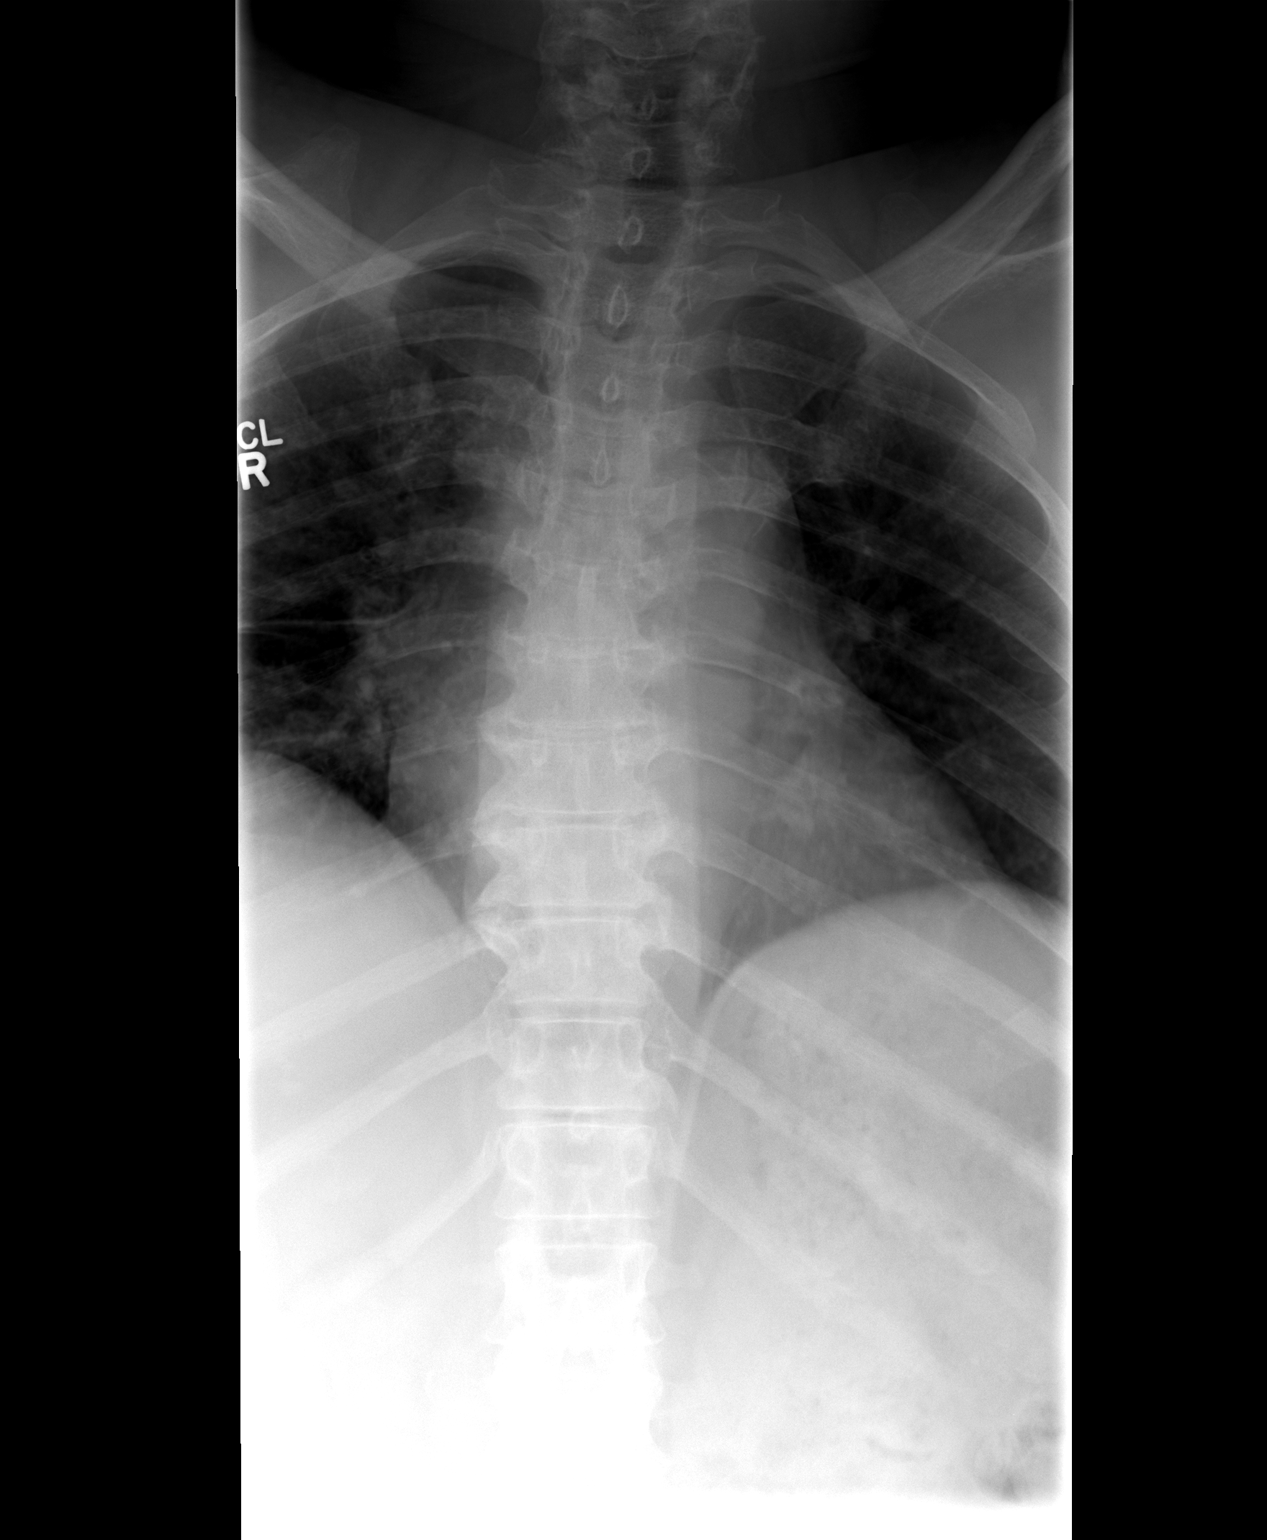

[view not recorded (2 of 2)]
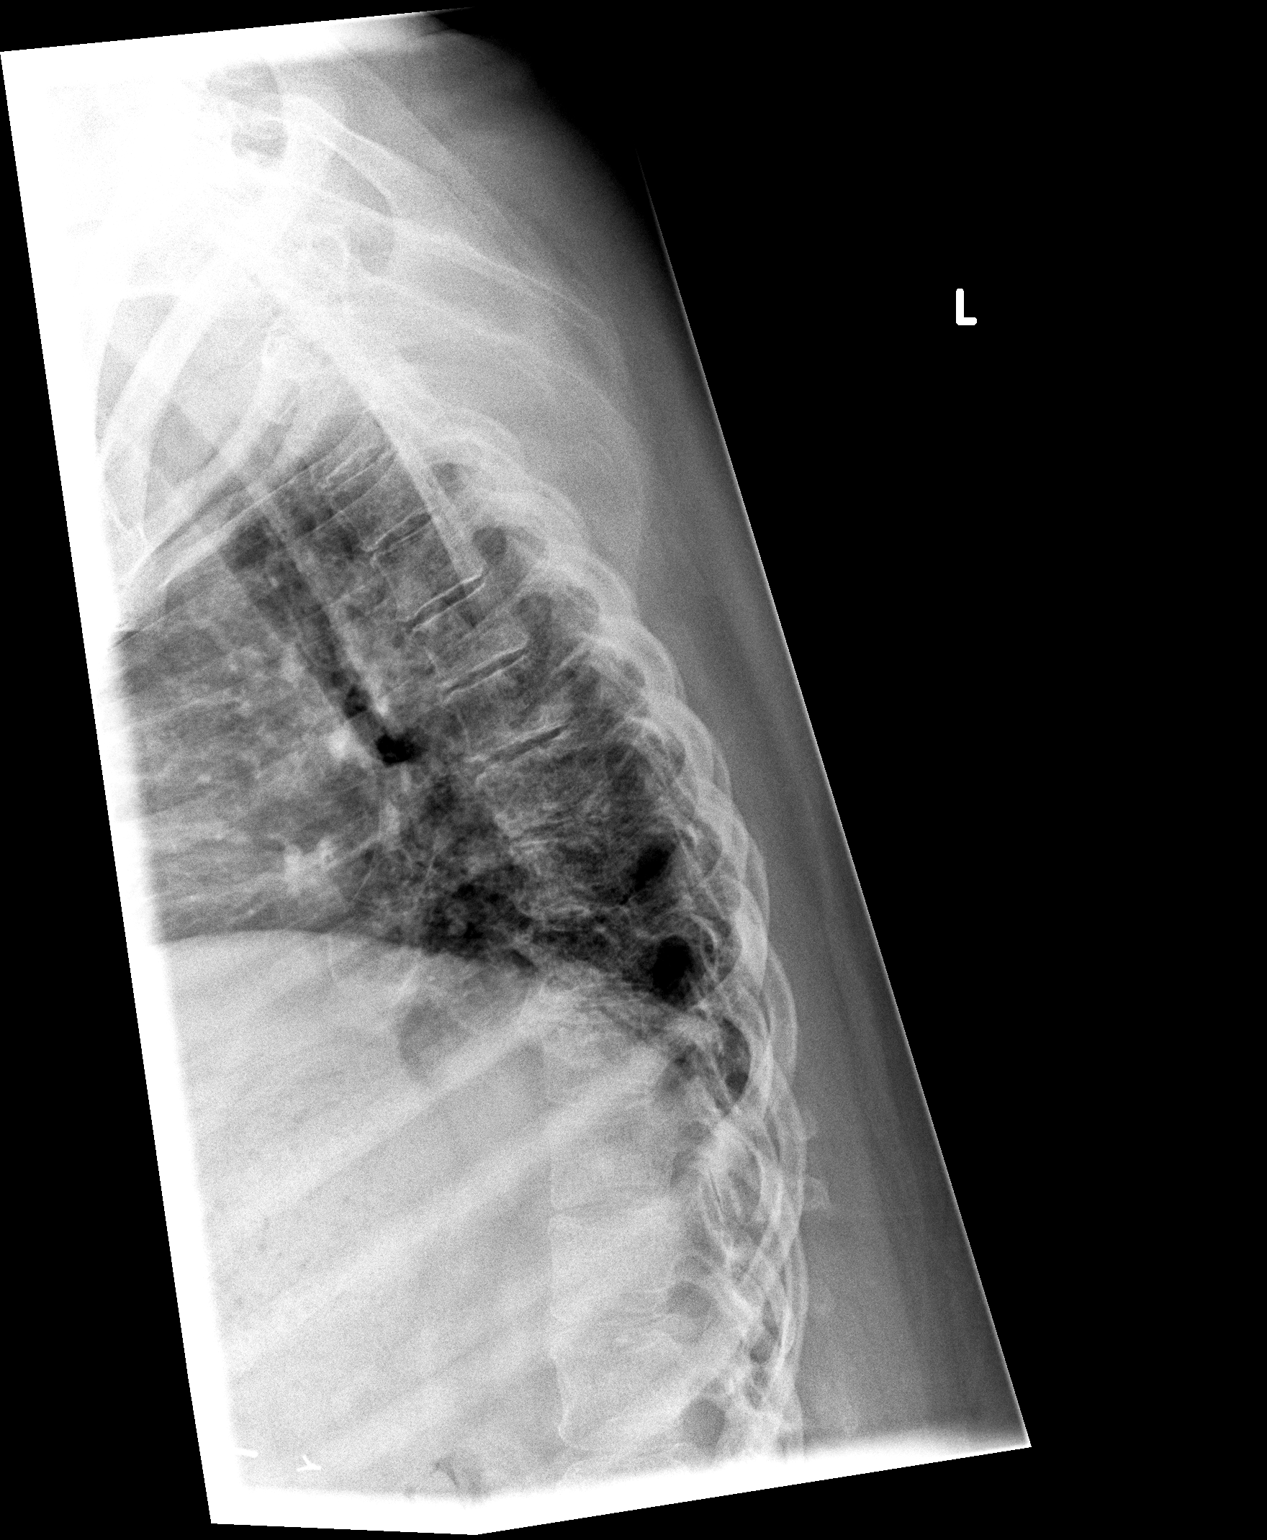

[2 of 2 positions shown; findings below may reference images not displayed]

FINDINGS: No evidence of acute fracture or subluxation. Mild to
moderate degenerative disc disease is seen at multiple levels of
the mid and lower thoracic spine.  No focal lytic or sclerotic
lesions identified.  No evidence of paraspinal mass.
IMPRESSION: 1.  No acute findings.
2.  Mild to moderate thoracic degenerative disc disease.

## 2011-06-16 IMAGING — CR DG RIBS W/ CHEST 3+V*L*
3 series · 3 of 3 positions shown · non-contrast
Comparison: 09/11/2009

CLINICAL DATA: Recent fall.  Left chest injury and rib pain.

LEFT RIBS AND CHEST - 3+ VIEW

[view not recorded (1 of 3)]
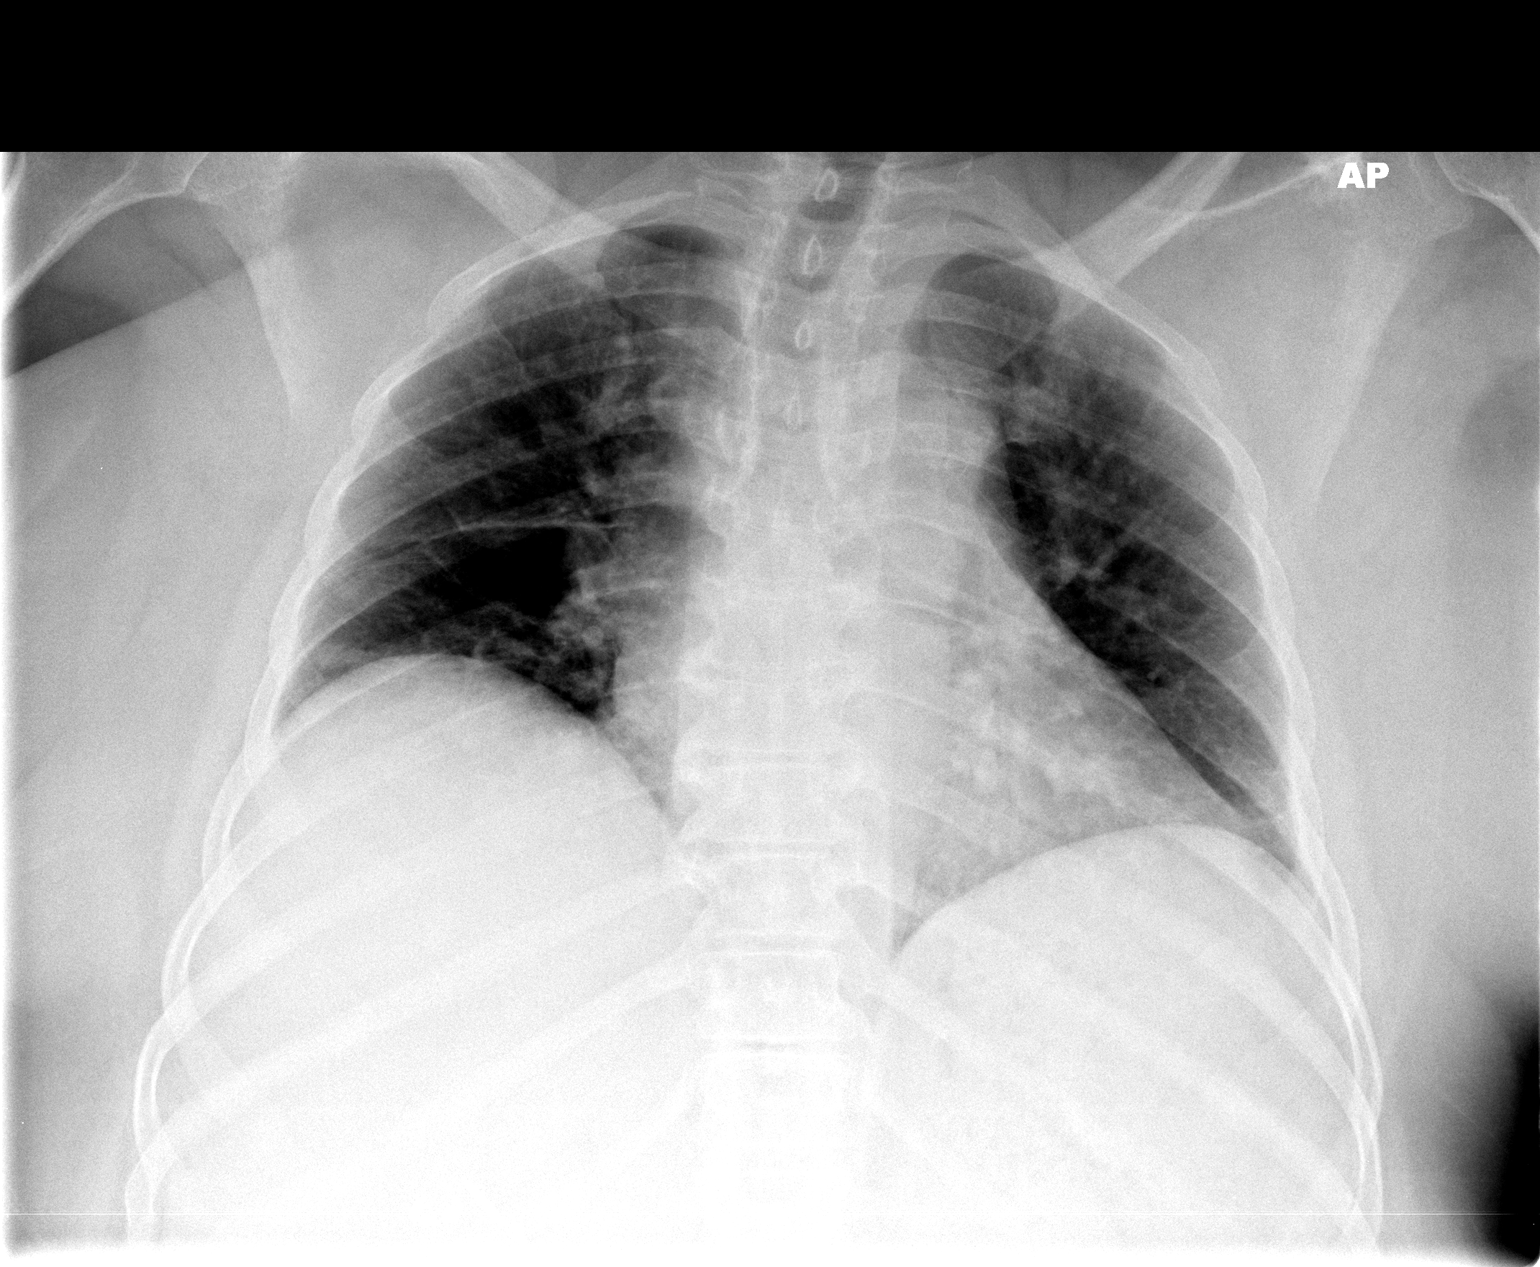

[view not recorded (2 of 3)]
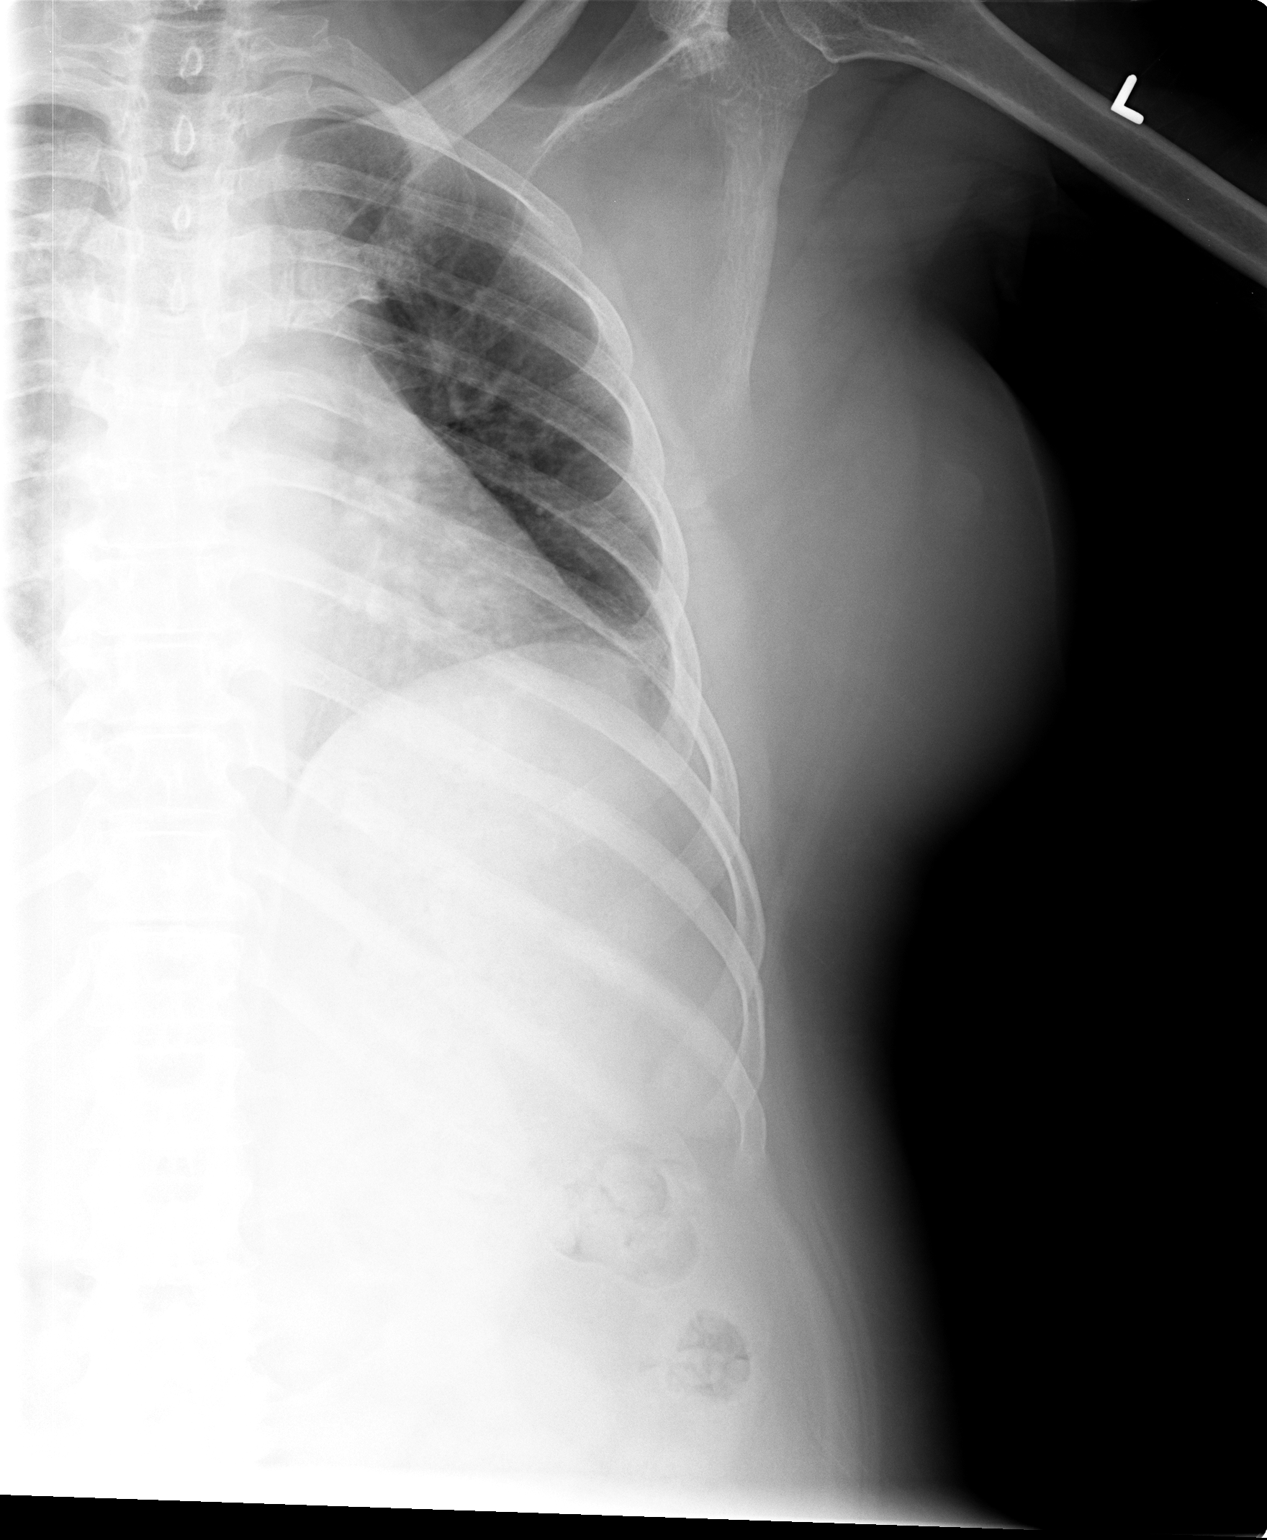

[view not recorded (3 of 3)]
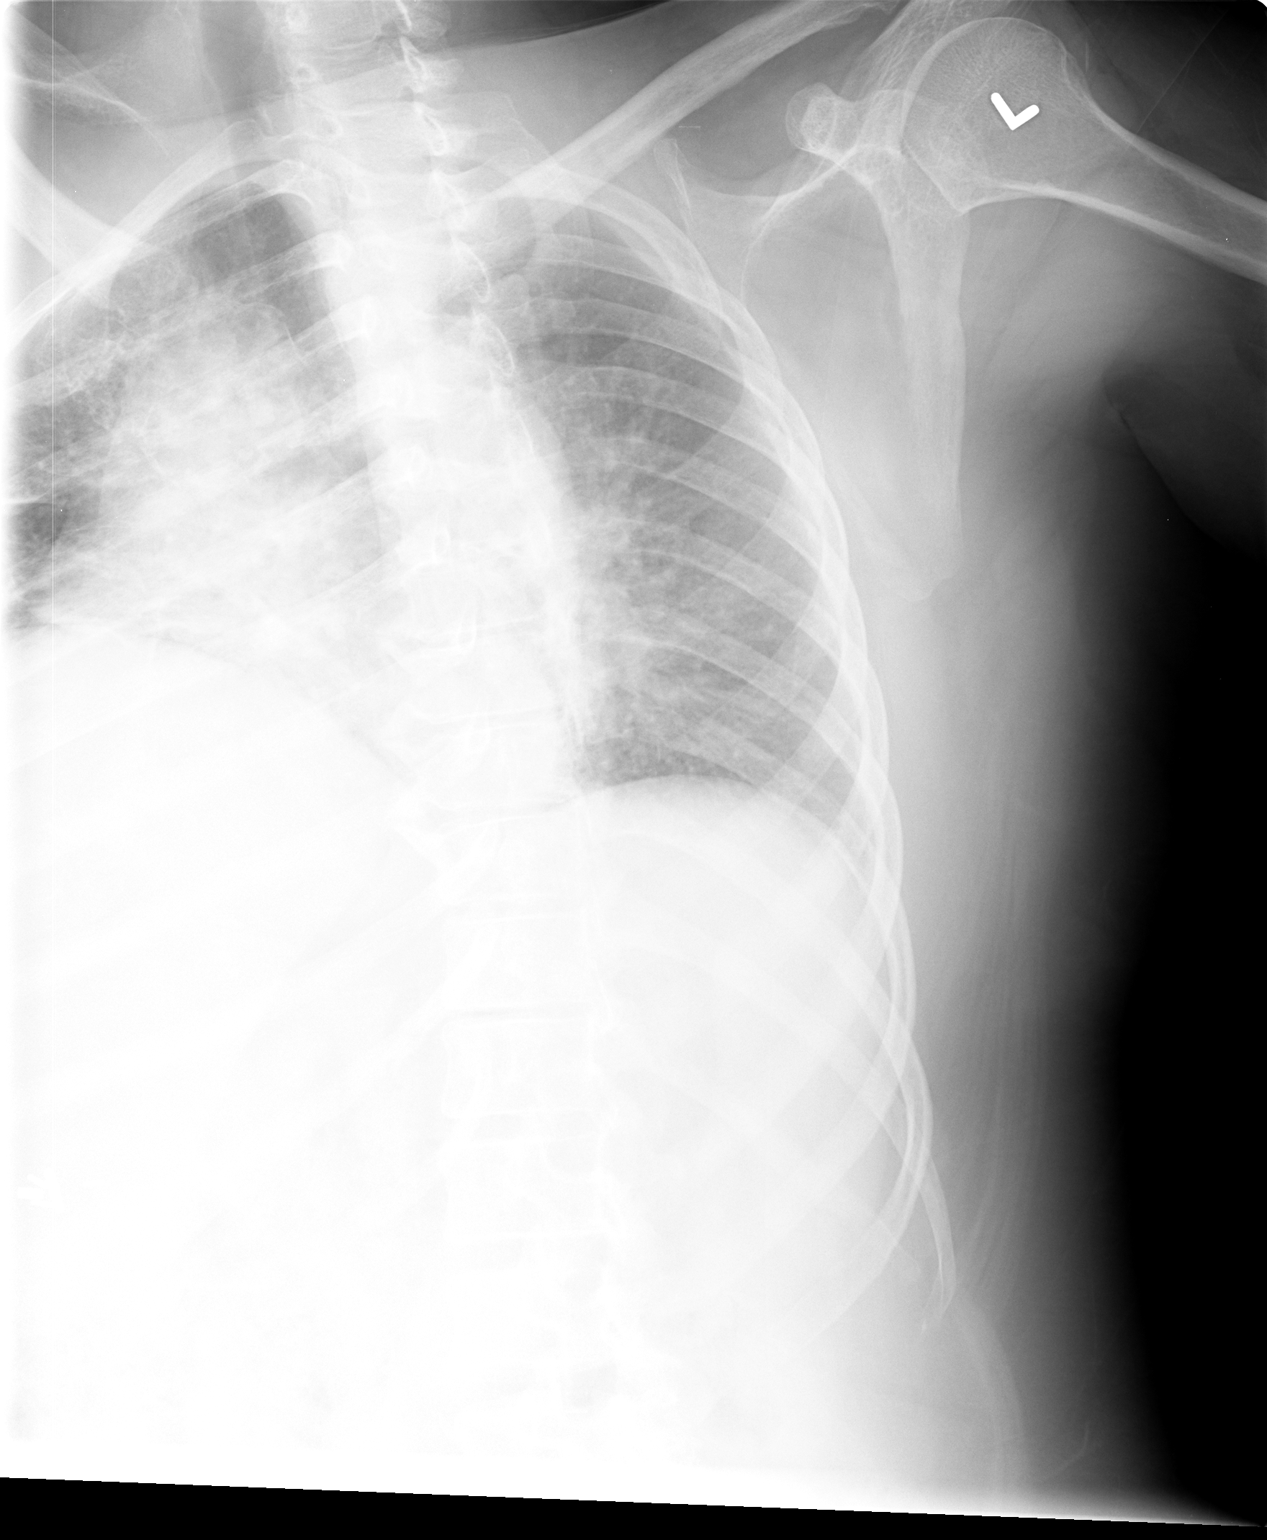

[3 of 3 positions shown; findings below may reference images not displayed]

FINDINGS: No fractures or other bone lesions are seen involve the
left ribs.

There is no evidence of pneumothorax or pleural effusion.  Mild
bibasilar atelectasis is noted.  Heart size is within normal
limits.
IMPRESSION: 1.  No evidence of left rib fracture.
2.  No evidence of pneumothorax or pleural effusion.  Mild
bibasilar atelectasis.

## 2011-06-19 ENCOUNTER — Other Ambulatory Visit: Payer: Self-pay

## 2011-06-19 MED ORDER — RABEPRAZOLE SODIUM 20 MG PO TBEC: 20.0000 mg | DELAYED_RELEASE_TABLET | Freq: Every day | ORAL | Status: DC

## 2011-06-20 ENCOUNTER — Other Ambulatory Visit: Payer: Self-pay | Admitting: Family Medicine

## 2011-06-20 ENCOUNTER — Telehealth: Payer: Self-pay | Admitting: Gastroenterology

## 2011-06-20 NOTE — Telephone Encounter (Signed)
Open in error

## 2011-06-21 ENCOUNTER — Encounter: Payer: Self-pay | Admitting: Family Medicine

## 2011-06-21 ENCOUNTER — Ambulatory Visit (INDEPENDENT_AMBULATORY_CARE_PROVIDER_SITE_OTHER): Payer: Medicare Other | Admitting: Family Medicine

## 2011-06-21 VITALS — BP 100/60 | HR 76 | Resp 16 | Ht 62.0 in | Wt 159.0 lb

## 2011-06-21 DIAGNOSIS — R110 Nausea: Secondary | ICD-10-CM

## 2011-06-21 DIAGNOSIS — K219 Gastro-esophageal reflux disease without esophagitis: Secondary | ICD-10-CM

## 2011-06-21 DIAGNOSIS — I10 Essential (primary) hypertension: Secondary | ICD-10-CM

## 2011-06-21 DIAGNOSIS — M549 Dorsalgia, unspecified: Secondary | ICD-10-CM

## 2011-06-21 DIAGNOSIS — E119 Type 2 diabetes mellitus without complications: Secondary | ICD-10-CM

## 2011-06-21 DIAGNOSIS — E785 Hyperlipidemia, unspecified: Secondary | ICD-10-CM

## 2011-06-21 MED ORDER — KETOROLAC TROMETHAMINE 30 MG/ML IJ SOLN
30.0000 mg | Freq: Once | INTRAMUSCULAR | Status: AC
  Administered 2011-06-21: 30 mg via INTRAMUSCULAR

## 2011-06-21 MED ORDER — ONDANSETRON HCL 4 MG/2ML IJ SOLN
4.0000 mg | Freq: Once | INTRAMUSCULAR | Status: AC
  Administered 2011-06-21: 4 mg via INTRAMUSCULAR

## 2011-06-21 NOTE — Assessment & Plan Note (Signed)
Uncontrolled symptoms, reports dysphagia and nausea, will give zofran today, and she has upcoming gI appt, may needdilatation

## 2011-06-21 NOTE — Assessment & Plan Note (Signed)
Controlled, no change in medication  

## 2011-06-21 NOTE — Patient Instructions (Addendum)
F/U in 3 month  REDUCE the glipizide to once daily, your blood sugar is excellent.  Your cholesterol is excellent, no medication change  Toradol 30mg  in the office for leg and back pain, also zofran 4 mg IM in the office for nausea today   HAPPY Birthday on [birth year]    HBA1c and chem7

## 2011-06-21 NOTE — Assessment & Plan Note (Signed)
Worsened symptoms and uncontrolled,being treated by pain clinic , toradol in office today

## 2011-06-21 NOTE — Assessment & Plan Note (Signed)
Improved, reduce glipizide to one daily

## 2011-06-21 NOTE — Progress Notes (Signed)
  Subjective:    Patient ID: [name], female    DOB: 1950/03/28, 61 y.o.   MRN: [id number]  HPI The PT is here for follow up and re-evaluation of chronic medical conditions, medication management and review of any available recent lab and radiology data.  Preventive health is updated, specifically  Cancer screening and Immunization.   Questions or concerns regarding consultations or procedures which the PT has had in the interim are  addressed. The PT denies any adverse reactions to current medications since the last visit.  C//o increased and uncontrolled back pain radiating to toes, despite recent back surgery, now being seen in a pain clinic.requests toradol Also c/o increased nausea and dysphagia in the past month, has upcoming gI appt and requests medication for nausea Denies polyuria or polydipsia, feels bloo sugars may be a bit low, at times, though denies hypoglycemic episodes. Recent hBa1C is 6.0 so glipizide dose is decreased    Review of Systems See HPI Denies recent fever or chills. Denies sinus pressure, nasal congestion, ear pain or sore throat. Denies chest congestion, productive cough or wheezing. Denies chest pains, palpitations and leg swelling Denies  vomiting,diarrhea or constipation.   Denies dysuria, frequency, hesitancy or incontinence.  Denies headaches, seizures, numbness, or tingling. c/o depression, and  Insomnia.Has new psychiatrist who is adjusting medication Denies skin break down or rash.        Objective:   Physical Exam  Patient alert and oriented and in no cardiopulmonary distress.  HEENT: No facial asymmetry, EOMI, no sinus tenderness,  oropharynx pink and moist.  Neck supple no adenopathy.  Chest: Clear to auscultation bilaterally.  CVS: S1, S2 no murmurs, no S3.  ABD: Soft mild epigastric tenderness, no guarding or rebound. Bowel sounds normal.  Ext: No edema  MS: Adequate though reduced  ROM spine, shoulders, hips and  knees.  Skin: Intact, no ulcerations or rash noted.  Psych: Good eye contact, blunt affect. Memory loss not anxious or depressed appearing.  CNS: CN 2-12 intact, power, normal throughout.       Assessment & Plan:

## 2011-06-27 ENCOUNTER — Ambulatory Visit: Payer: Medicare Other | Admitting: Urgent Care

## 2011-06-28 ENCOUNTER — Ambulatory Visit (INDEPENDENT_AMBULATORY_CARE_PROVIDER_SITE_OTHER): Payer: Medicare Other | Admitting: Orthopedic Surgery

## 2011-06-28 ENCOUNTER — Encounter: Payer: Self-pay | Admitting: Orthopedic Surgery

## 2011-06-28 VITALS — BP 90/50 | Ht 62.0 in | Wt 159.0 lb

## 2011-06-28 DIAGNOSIS — G5600 Carpal tunnel syndrome, unspecified upper limb: Secondary | ICD-10-CM

## 2011-06-28 NOTE — Patient Instructions (Signed)
Carpal tunnel syndrome and demyelinating neuropathy

## 2011-06-28 NOTE — Progress Notes (Signed)
Patient ID: [name], female   DOB: Jul 10, 1950, 61 y.o.   MRN: [id number] Chief Complaint  Patient presents with  . Results    NCS results    The nerve conduction study was performed on June 05, 2011 the patient is noted to have moderate RIGHT median neuropathy with significant underlying mixed demyelinating sensory neuropathy.  She would not like any further surgical treatment. She would have a 50-50 chance of improvement without full cure with a carpal tunnel release.

## 2011-07-10 ENCOUNTER — Other Ambulatory Visit: Payer: Self-pay | Admitting: Family Medicine

## 2011-07-11 ENCOUNTER — Telehealth: Payer: Self-pay | Admitting: Family Medicine

## 2011-07-12 NOTE — Telephone Encounter (Signed)
Is there even anything prescription to use for this?

## 2011-07-13 ENCOUNTER — Telehealth: Payer: Self-pay | Admitting: Family Medicine

## 2011-07-13 ENCOUNTER — Other Ambulatory Visit: Payer: Self-pay | Admitting: Family Medicine

## 2011-07-13 IMAGING — CT CT ABD-PELV W/ CM
2 of 4 series · 16 of 46 positions shown, 18 images · IV contrast (Omnipaque 300)
Comparison: 07/16/2008

CLINICAL DATA: Left-sided flank pain.  Prior hysterectomy and
cholecystectomy.

CT ABDOMEN AND PELVIS WITH CONTRAST
TECHNIQUE: Multidetector CT imaging of the abdomen and pelvis was
performed following the standard protocol during bolus
administration of intravenous contrast.
Contrast: 100 ml Omniscan 300 IV contrast

[Series 3: abd_pel_with 5.0 b40f · axial · 0.75mm/px · z∈[-434,-39]mm · 13 of 89 slices shown, 15 images]
[im 5/89  soft-tissue]
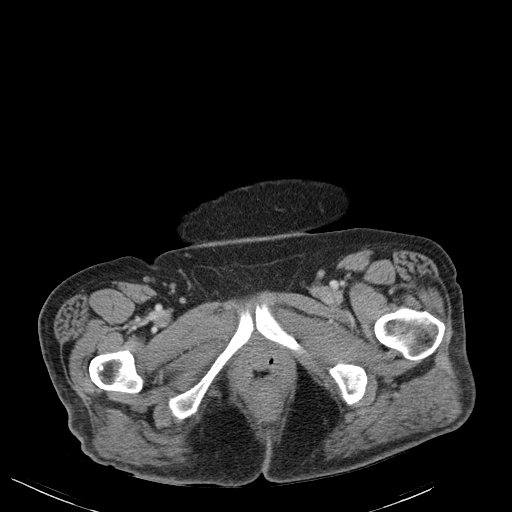
[im 5/89  bone]
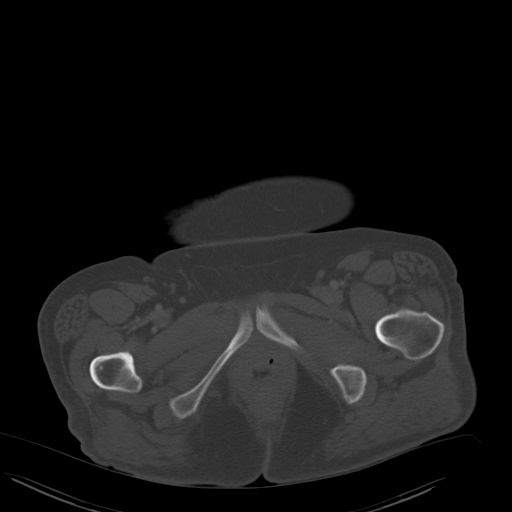
[im 13/89  soft-tissue]
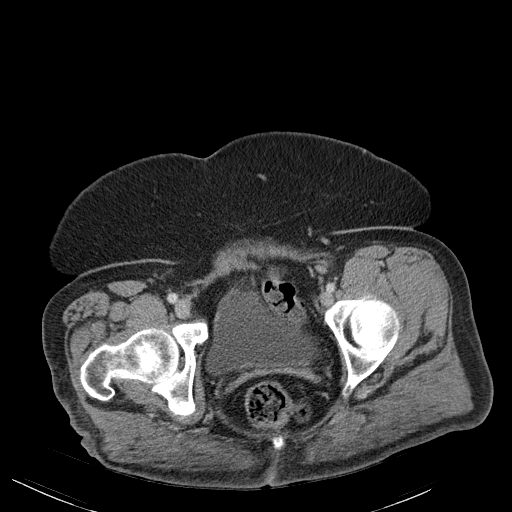
[im 17/89  soft-tissue]
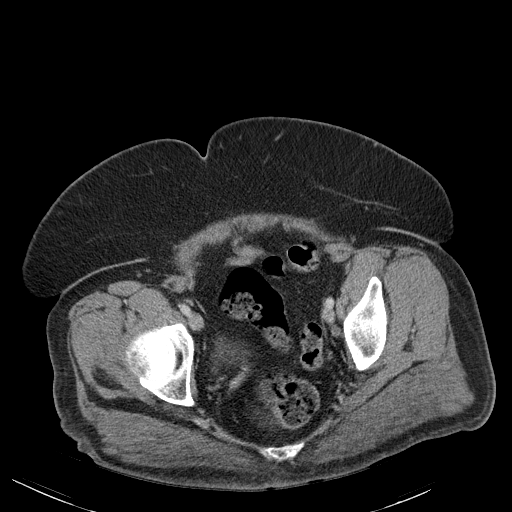
[im 26/89  soft-tissue]
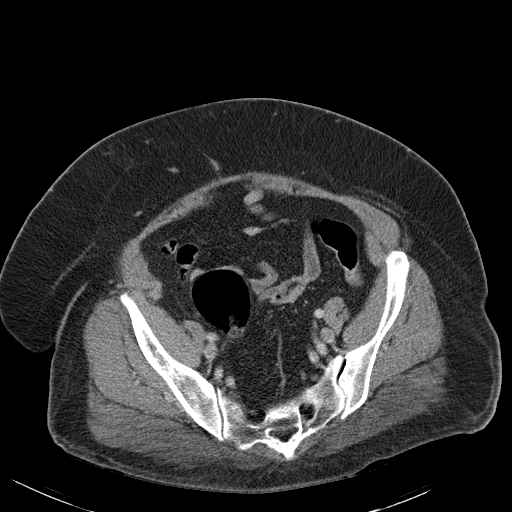
[im 30/89  soft-tissue]
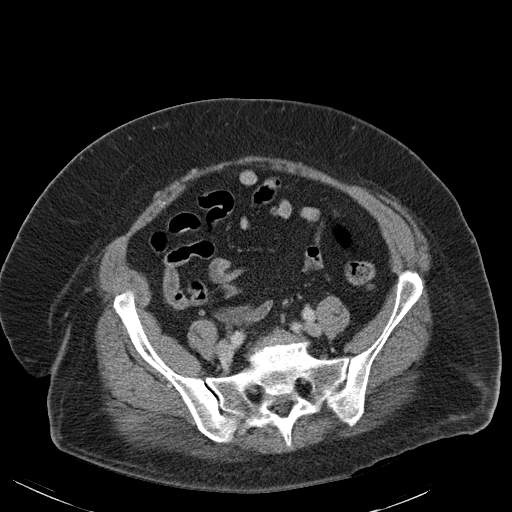
[im 38/89  soft-tissue]
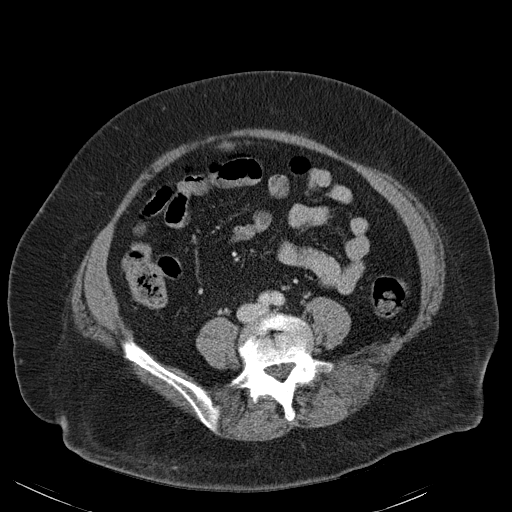
[im 47/89  soft-tissue]
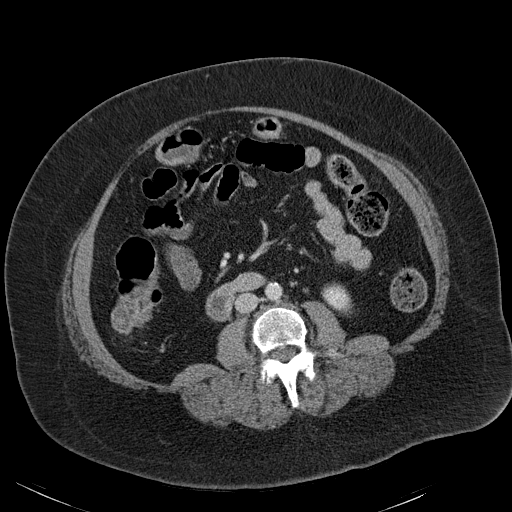
[im 51/89  soft-tissue]
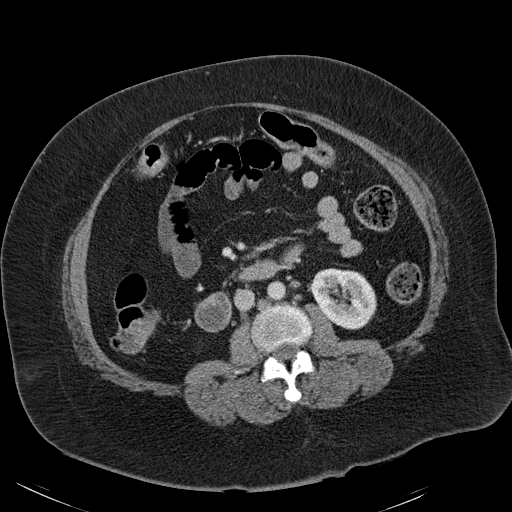
[im 59/89  soft-tissue]
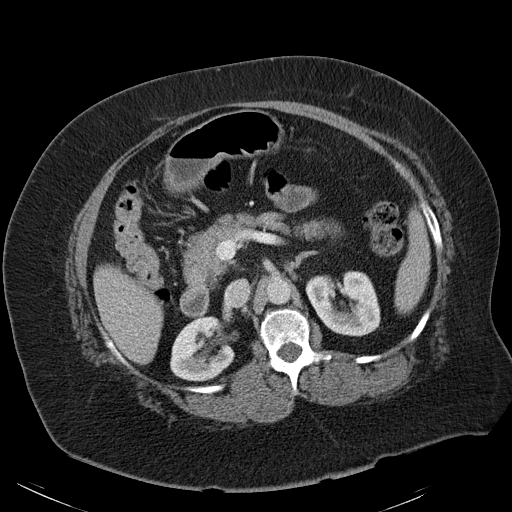
[im 59/89  bone]
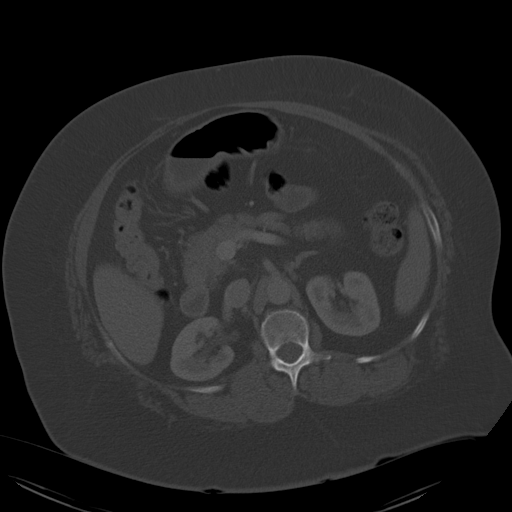
[im 63/89  soft-tissue]
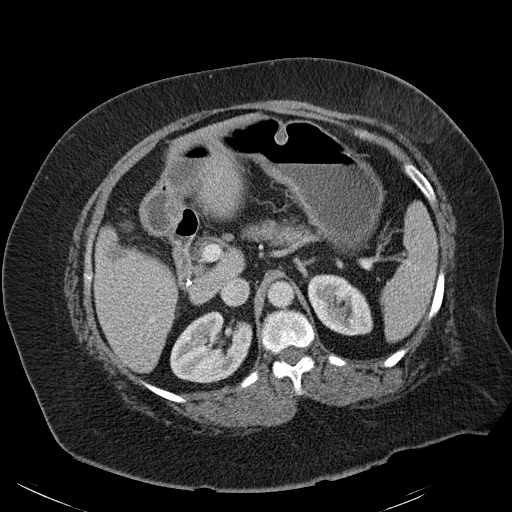
[im 72/89  soft-tissue]
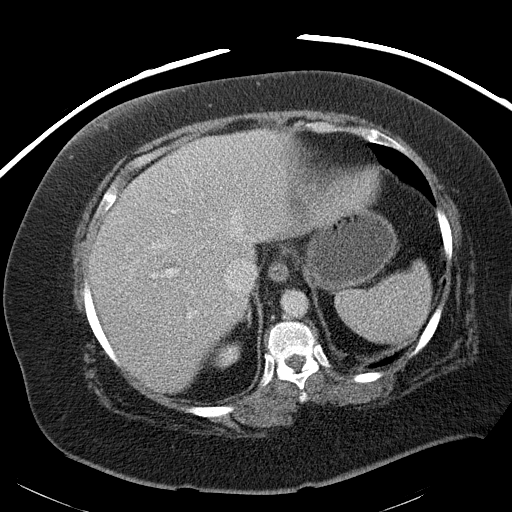
[im 76/89  soft-tissue]
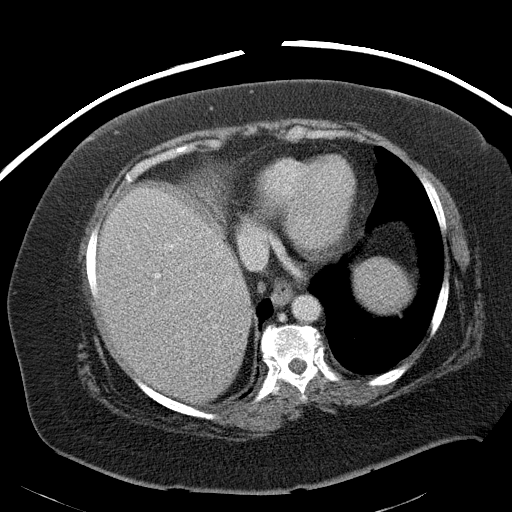
[im 84/89  soft-tissue]
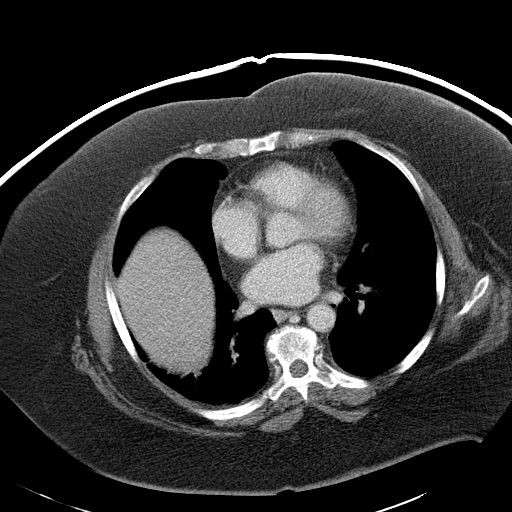

[Series 4: abd_pel_with 3.0 spo cor · coronal · 0.73mm/px · 3 of 87 slices shown]
[im 29/87  soft-tissue]
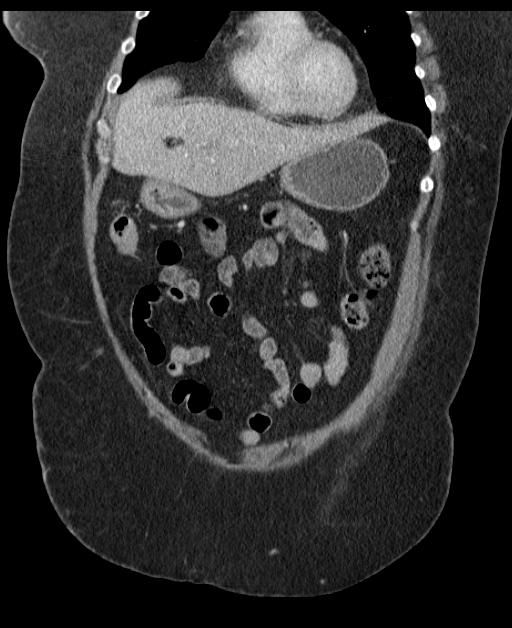
[im 39/87  soft-tissue]
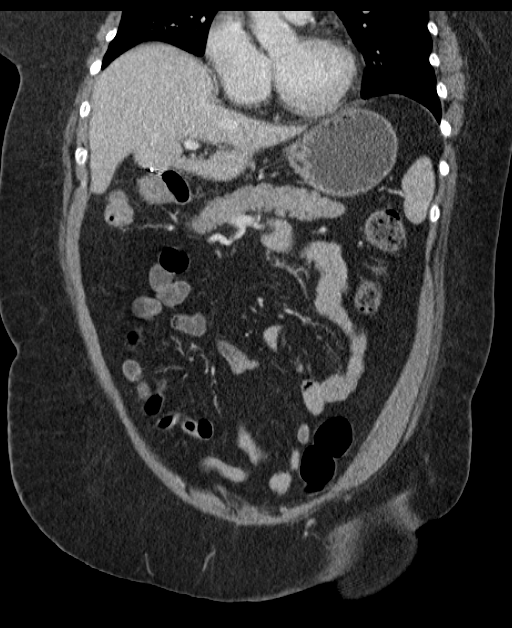
[im 48/87  soft-tissue]
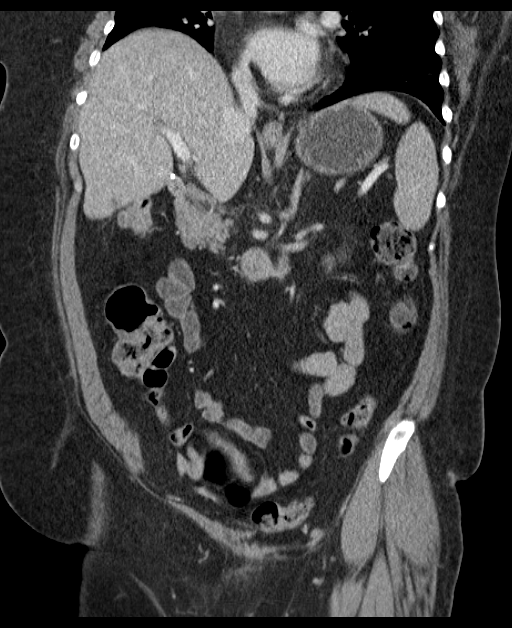

[16 of 46 positions shown; findings below may reference images not displayed]

FINDINGS: Linear bibasilar, right greater than left, atelectasis or
scarring noted.  Cholecystectomy clips noted. Ill-defined
hypodensity adjacent to the gallbladder fossa image 27 most likely
reflects focal fat.  Liver otherwise unremarkable.  Adrenal glands,
spleen, kidneys, and pancreas are unremarkable.  No ascites or
lymphadenopathy.  No free air.

Scattered colonic diverticuli noted without evidence for
diverticulitis. Small and large bowel otherwise unremarkable.  The
appendix is normal. Injection granulomata are noted in the
buttocks.  Uterus presumed surgically absent.  Neither ovary
definitely identified.  No adnexal mass.  No pelvic free fluid or
lymphadenopathy.  Disc degenerative disease noted.

Minimal soft tissue stranding within the subcutaneous fat of the
left anterolateral abdominal wall.
IMPRESSION: No acute intra-abdominal or pelvic pathology.  There is minimal
soft tissue stranding within the subcutaneous fat of the left
anterolateral abdominal wall.  Correlate with any history of trauma
to this area, less likely injection.

## 2011-07-13 IMAGING — CR DG CHEST 1V
1 series · 1 of 1 positions shown · non-contrast
Comparison: 11/21/2009

CLINICAL DATA: Left upper quadrant abdominal and flank pain.
Bruising.

CHEST - 1 VIEW

[view not recorded]
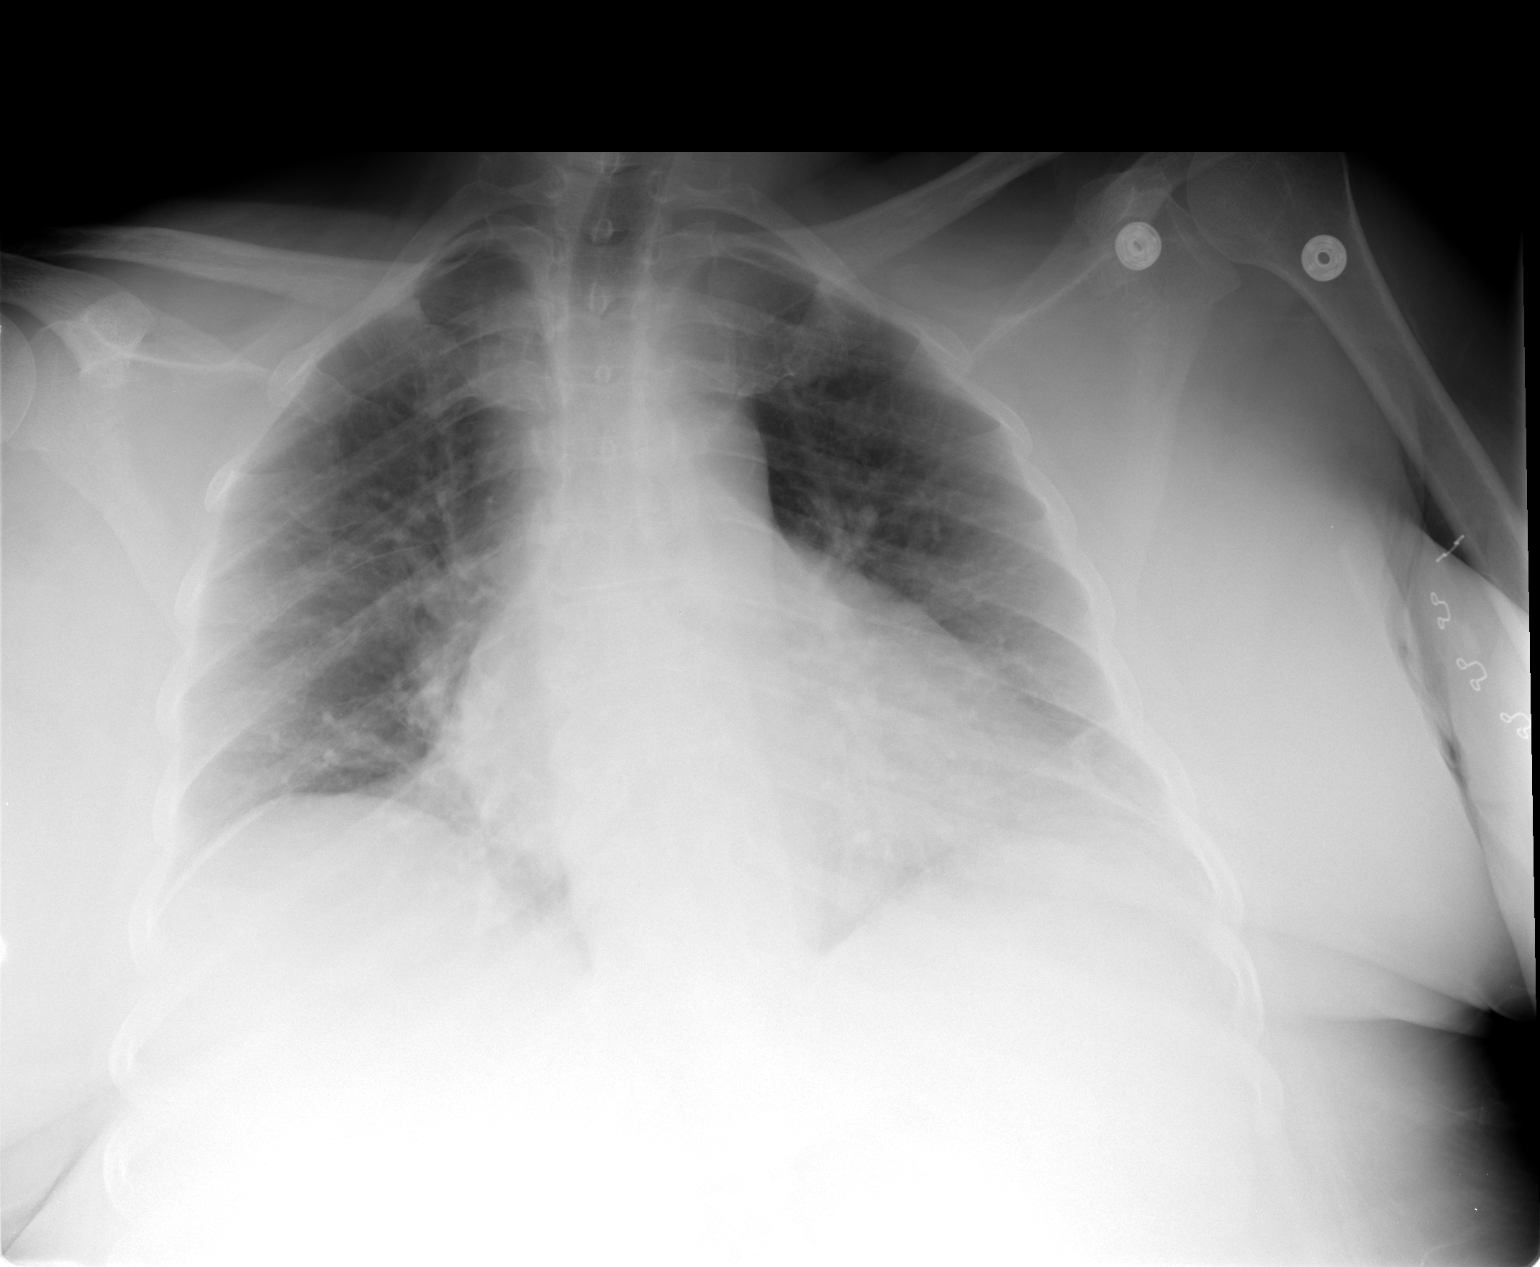

[1 of 1 positions shown; findings below may reference images not displayed]

FINDINGS: Fine detail is obscured by patient body habitus. Moderate
cardiomegaly noted with central vascular congestion but no overt
edema.  Lung volumes are low with bibasilar plate-like probable
atelectasis.  No pleural effusion.  No acute osseous finding.
IMPRESSION: Low volumes with bibasilar plate-like atelectasis.

Moderate cardiomegaly without edema.

## 2011-07-13 NOTE — Telephone Encounter (Signed)
Yes change to levimir same dose as the lantus please

## 2011-07-13 NOTE — Telephone Encounter (Signed)
Needs it changed to levemir per RX care. Ok to change?

## 2011-07-14 ENCOUNTER — Other Ambulatory Visit: Payer: Self-pay | Admitting: Family Medicine

## 2011-07-14 MED ORDER — DIPHENOXYLATE-ATROPINE 2.5-0.025 MG PO TABS: 1.0000 | ORAL_TABLET | Freq: Four times a day (QID) | ORAL | Status: DC | PRN

## 2011-07-14 MED ORDER — PROMETHAZINE HCL 12.5 MG PO TABS: 12.5000 mg | ORAL_TABLET | Freq: Four times a day (QID) | ORAL | Status: DC | PRN

## 2011-07-14 NOTE — Telephone Encounter (Signed)
Pharmacy aware and will change

## 2011-07-14 NOTE — Telephone Encounter (Signed)
2 day h/o vomiting and diarreah, meds will be sent in.  Please PA for pen device for pt's insulin due to vision and coordination difficulty.  Please ask the pharmacy and let me know the name of med covered for dry mouth

## 2011-07-17 ENCOUNTER — Telehealth: Payer: Self-pay | Admitting: Family Medicine

## 2011-07-17 ENCOUNTER — Other Ambulatory Visit: Payer: Self-pay

## 2011-07-17 DIAGNOSIS — E119 Type 2 diabetes mellitus without complications: Secondary | ICD-10-CM

## 2011-07-17 MED ORDER — DIPHENOXYLATE-ATROPINE 2.5-0.025 MG PO TABS: 1.0000 | ORAL_TABLET | Freq: Three times a day (TID) | ORAL | Status: DC | PRN

## 2011-07-17 NOTE — Telephone Encounter (Signed)
Not on med list? Supposed to be taking?

## 2011-07-17 NOTE — Telephone Encounter (Signed)
Sent for PA

## 2011-07-17 NOTE — Telephone Encounter (Signed)
Request sent to pharmacy for info

## 2011-07-17 NOTE — Telephone Encounter (Signed)
Yes send for pa please

## 2011-07-21 ENCOUNTER — Other Ambulatory Visit: Payer: Self-pay | Admitting: Family Medicine

## 2011-07-31 ENCOUNTER — Telehealth: Payer: Self-pay | Admitting: Family Medicine

## 2011-08-01 ENCOUNTER — Encounter (HOSPITAL_COMMUNITY): Payer: Self-pay | Admitting: Emergency Medicine

## 2011-08-01 ENCOUNTER — Ambulatory Visit (INDEPENDENT_AMBULATORY_CARE_PROVIDER_SITE_OTHER): Payer: Medicare Other | Admitting: Urgent Care

## 2011-08-01 ENCOUNTER — Inpatient Hospital Stay (HOSPITAL_COMMUNITY)
Admission: EM | Admit: 2011-08-01 | Discharge: 2011-08-03 | DRG: 092 | Disposition: A | Discharge status: Home / Self Care | Payer: Medicare Other | Attending: Internal Medicine | Admitting: Internal Medicine

## 2011-08-01 ENCOUNTER — Emergency Department (HOSPITAL_COMMUNITY): Payer: Medicare Other

## 2011-08-01 ENCOUNTER — Encounter: Payer: Self-pay | Admitting: Family Medicine

## 2011-08-01 ENCOUNTER — Encounter: Payer: Self-pay | Admitting: Urgent Care

## 2011-08-01 DIAGNOSIS — E782 Mixed hyperlipidemia: Secondary | ICD-10-CM | POA: Diagnosis present

## 2011-08-01 DIAGNOSIS — I69959 Hemiplegia and hemiparesis following unspecified cerebrovascular disease affecting unspecified side: Secondary | ICD-10-CM

## 2011-08-01 DIAGNOSIS — H547 Unspecified visual loss: Secondary | ICD-10-CM | POA: Insufficient documentation

## 2011-08-01 DIAGNOSIS — Z6827 Body mass index (BMI) 27.0-27.9, adult: Secondary | ICD-10-CM

## 2011-08-01 DIAGNOSIS — R5381 Other malaise: Secondary | ICD-10-CM | POA: Diagnosis present

## 2011-08-01 DIAGNOSIS — R5383 Other fatigue: Secondary | ICD-10-CM | POA: Diagnosis present

## 2011-08-01 DIAGNOSIS — N390 Urinary tract infection, site not specified: Secondary | ICD-10-CM | POA: Diagnosis present

## 2011-08-01 DIAGNOSIS — R109 Unspecified abdominal pain: Secondary | ICD-10-CM

## 2011-08-01 DIAGNOSIS — G4730 Sleep apnea, unspecified: Secondary | ICD-10-CM | POA: Diagnosis present

## 2011-08-01 DIAGNOSIS — K5900 Constipation, unspecified: Secondary | ICD-10-CM

## 2011-08-01 DIAGNOSIS — E860 Dehydration: Secondary | ICD-10-CM | POA: Diagnosis present

## 2011-08-01 DIAGNOSIS — Z794 Long term (current) use of insulin: Secondary | ICD-10-CM

## 2011-08-01 DIAGNOSIS — Z79899 Other long term (current) drug therapy: Secondary | ICD-10-CM

## 2011-08-01 DIAGNOSIS — E119 Type 2 diabetes mellitus without complications: Secondary | ICD-10-CM | POA: Diagnosis present

## 2011-08-01 DIAGNOSIS — F319 Bipolar disorder, unspecified: Secondary | ICD-10-CM | POA: Diagnosis present

## 2011-08-01 DIAGNOSIS — E039 Hypothyroidism, unspecified: Secondary | ICD-10-CM | POA: Diagnosis present

## 2011-08-01 DIAGNOSIS — R1310 Dysphagia, unspecified: Secondary | ICD-10-CM

## 2011-08-01 DIAGNOSIS — N179 Acute kidney failure, unspecified: Secondary | ICD-10-CM | POA: Diagnosis present

## 2011-08-01 DIAGNOSIS — R404 Transient alteration of awareness: Secondary | ICD-10-CM

## 2011-08-01 DIAGNOSIS — G92 Toxic encephalopathy: Principal | ICD-10-CM | POA: Diagnosis present

## 2011-08-01 DIAGNOSIS — I10 Essential (primary) hypertension: Secondary | ICD-10-CM | POA: Diagnosis present

## 2011-08-01 DIAGNOSIS — G4733 Obstructive sleep apnea (adult) (pediatric): Secondary | ICD-10-CM | POA: Diagnosis present

## 2011-08-01 DIAGNOSIS — F411 Generalized anxiety disorder: Secondary | ICD-10-CM | POA: Diagnosis present

## 2011-08-01 DIAGNOSIS — R7302 Impaired glucose tolerance (oral): Secondary | ICD-10-CM | POA: Diagnosis present

## 2011-08-01 DIAGNOSIS — F39 Unspecified mood [affective] disorder: Secondary | ICD-10-CM | POA: Diagnosis present

## 2011-08-01 DIAGNOSIS — I69359 Hemiplegia and hemiparesis following cerebral infarction affecting unspecified side: Secondary | ICD-10-CM

## 2011-08-01 DIAGNOSIS — K219 Gastro-esophageal reflux disease without esophagitis: Secondary | ICD-10-CM

## 2011-08-01 DIAGNOSIS — E669 Obesity, unspecified: Secondary | ICD-10-CM | POA: Diagnosis present

## 2011-08-01 DIAGNOSIS — G929 Unspecified toxic encephalopathy: Principal | ICD-10-CM | POA: Diagnosis present

## 2011-08-01 DIAGNOSIS — G9341 Metabolic encephalopathy: Secondary | ICD-10-CM | POA: Diagnosis present

## 2011-08-01 DIAGNOSIS — E785 Hyperlipidemia, unspecified: Secondary | ICD-10-CM | POA: Diagnosis present

## 2011-08-01 HISTORY — DX: Metabolic encephalopathy: G93.41

## 2011-08-01 NOTE — ED Notes (Signed)
Patient presents to ER via EMS with c/o altered mental status.  Patient lethargic, but arouse to verbal stimuli.  When patient awakes, she is A&O.  Skin w/d. Respirations even and unlabored; continues to state she is ready to go back home.

## 2011-08-01 NOTE — ED Notes (Signed)
Pt lethargic.  Opens eyes to verbal and painful stimuli, but for short period of time and non-verbal.  Unable to determine what pt took.  Per EMS report, pt informed them she "takes a lot of medications".  Narcan was administered by EMS, no results.

## 2011-08-01 NOTE — Progress Notes (Addendum)
Referring Provider: Kerri Perches, MD Primary Care Physician:  Syliva Overman, MD, MD Primary Gastroenterologist:  Dr. Jena Gauss  Chief Complaint  Patient presents with  . Dysphagia  . Constipation    L side pain    HPI:  [name] is a 61 y.o. female here for follow up for dysphagia.   C/o nausea & vomiting pills after taking them.  Meats stuck in upper esohagus.  No problems w/ liquids.  She has been out of insulin 2 weeks due to no insurance coverage.  Checking blood sugars running 170-200's.  C/o abdominal & left sided pain that shoots around to her navel.   Pain is worse w/ walking.  Pain 6/10 now, can be worse.  C/o pain for months off & on.  Fell this AM @ 0230 with "weak legs" & fell backwards.  Denies dizziness or LOC.  BM up to TID w/ miralax BID.  Past Medical History  Diagnosis Date  . Bipolar disorder   . CVA (cerebral infarction)   . Pancreatitis 2006    due to Depakote with normal EUS   . Osteoporosis   . Sleep apnea   . Chronic back pain   . Diabetes mellitus   . Trigger finger   . Anxiety disorder   . Hypertension   . Migraines   . Diverticulosis     TCS 9/08 by Dr. Lina Sar for diarrhea . Bx for micro scopic colitis negative.   . Schatzki's ring     non critical / EGD with ED 8/2011with RMR  . S/P colonoscopy [id number], 2011    left-sided diverticula, hx of simple adenomas . 2011, random bx negative for microscopic colitis  . Glaucoma   . Allergic rhinitis   . Hypothyroidism     thyroid goiter  . Anemia   . Blood transfusion   . GERD (gastroesophageal reflux disease)   . Stroke     left sided weakness  . Seizures     unknown etiology-on meds-last seizure was 3 yerars ago  . Anxiety   . Depression   . Metabolic encephalopathy 08/03/2011    Past Surgical History  Procedure Date  . Abdominal hysterectomy   . Cholecystectomy   . Ovarian cyst removal   . Carpal tunnel release 07/22/04    left/ Dr. Romeo Apple   . Breast reduction surgery   .  Bilateral cataract surgery   . Biopsy of thyroid gland 2009  . Surgical excision of 3 tumors from right thigh and right buttock  and left upper thigh 2010  . Back surgery July 2012  . Spine surgery 09/29/2010    dr brooks  . Maloney dilation 12/29/2010    Procedure: Elease Hashimoto DILATION;  Surgeon: Corbin Ade, MD;  Location: [facility];  Service: Endoscopy;  Laterality: N/A;  56, 58,  . Esophagogastroduodenoscopy 12/29/2010    Rourk-Retained food in the esophagus and stomach, small hiatal hernia, status post Maloney dilation of the esophagus    Current Outpatient Prescriptions  Medication Sig Dispense Refill  . ANTIVERT 12.5 MG tablet TAKE 1 TABLET BY MOUTH 3 TIMES DAILY AS NEEDED FOR DIZZINESS.  30 each  3  . B-D UF III MINI PEN NEEDLES 31G X 5 MM MISC USE AS DIRECTED.  100 each  2  . cloNIDine (CATAPRES) 0.3 MG tablet TAKE 1 TABLET BY MOUTH 3 TIMES DAILY: TAKE AT 8 A.M.;4 P.M.; AND MIDNIGHT.  90 tablet  3  . CRESTOR 10 MG tablet TAKE 1 TABLET BY  MOUTH AT BEDTIME.  30 each  3  . cycloSPORINE (RESTASIS) 0.05 % ophthalmic emulsion 1 drop 2 (two) times daily.      . ergocalciferol (VITAMIN D2) 50000 UNITS capsule Take 50,000 Units by mouth once a week. Patient takes on Sundays      . fluticasone (FLONASE) 50 MCG/ACT nasal spray Place 1 spray into the nose daily. Allergies  16 g  3  . insulin detemir (LEVEMIR) 100 UNIT/ML injection Inject 30 Units into the skin at bedtime.      . lamoTRIgine (LAMICTAL) 100 MG tablet Take 100 mg by mouth every morning.       [name] levothyroxine (LEVOXYL) 50 MCG tablet Take 50 mcg by mouth every morning.       [name] LORazepam (ATIVAN) 1 MG tablet Take 0.5 tablets (0.5 mg total) by mouth 3 (three) times daily.  30 tablet    . metFORMIN (GLUCOPHAGE) 1000 MG tablet Take 1 tablet (1,000 mg total) by mouth 2 (two) times daily with a meal.  60 tablet  11  . mirtazapine (REMERON) 30 MG tablet Take 30 mg by mouth at bedtime.       [name] nystatin (NYSTOP) 100000 UNIT/GM POWD APPLY TO  AFFECTED AREAS TWICE DAILY.  60 g  2  . ondansetron (ZOFRAN) 4 MG tablet Take 1 tablet (4 mg total) by mouth every 8 (eight) hours as needed for nausea.  30 tablet  1  . ONGLYZA 5 MG TABS tablet TAKE 1 TABLET BY MOUTH ONCE DAILY.  30 tablet  3  . PLAVIX 75 MG tablet TAKE ONE TABLET BY MOUTH ONCE DAILY.  30 each  3  . polyethylene glycol (MIRALAX / GLYCOLAX) packet Take 17 g by mouth 2 (two) times daily.      . pregabalin (LYRICA) 75 MG capsule Take 75 mg by mouth at bedtime.       . RABEprazole (ACIPHEX) 20 MG tablet Take 1 tablet (20 mg total) by mouth daily.  31 tablet  11  . sertraline (ZOLOFT) 100 MG tablet Take 150 mg by mouth at bedtime.       [name] thiothixene (NAVANE) 2 MG capsule Take 2 mg by mouth at bedtime.       [name] tiZANidine (ZANAFLEX) 4 MG tablet Take 4 mg by mouth 3 (three) times daily as needed. For spasms      . VOLTAREN 1 % GEL APPLY TWICE DAILY TO AFFECTED AREA(S) AS NEEDED FOR PAIN.  100 g  1  . ciprofloxacin (CIPRO) 500 MG tablet Take 1 tablet (500 mg total) by mouth 2 (two) times daily.  6 tablet  0  . diphenoxylate-atropine (LOMOTIL) 2.5-0.025 MG per tablet Take 1 tablet by mouth 4 (four) times daily as needed. For loose stools      . glipiZIDE (GLUCOTROL) 10 MG tablet Take 10 mg by mouth at bedtime.       . hydrochlorothiazide (MICROZIDE) 12.5 MG capsule Take 12.5 mg by mouth daily.       . metoprolol (LOPRESSOR) 50 MG tablet Take 50 mg by mouth 2 (two) times daily.      . montelukast (SINGULAIR) 10 MG tablet       . oxyCODONE-acetaminophen (PERCOCET) 10-325 MG per tablet Take 1 tablet by mouth 3 (three) times daily as needed. Pain      . promethazine (PHENERGAN) 12.5 MG tablet Take 12.5 mg by mouth every 6 (six) hours as needed. For nausea      . traZODone (DESYREL) 150 MG  tablet       . DISCONTD: promethazine (PHENERGAN) 12.5 MG tablet Take 1 tablet (12.5 mg total) by mouth every 6 (six) hours as needed for nausea.  30 tablet  0   No current facility-administered  medications for this visit.   Facility-Administered Medications Ordered in Other Visits  Medication Dose Route Frequency Provider Last Rate Last Dose  . DISCONTD: 0.9 % NaCl with KCl 20 mEq/ L  infusion   Intravenous Continuous Vania Rea, MD 100 mL/hr at 08/03/11 0756    . DISCONTD: acetaminophen (TYLENOL) suppository 650 mg  650 mg Rectal Q6H PRN Vania Rea, MD      . DISCONTD: acetaminophen (TYLENOL) tablet 650 mg  650 mg Oral Q6H PRN Vania Rea, MD      . DISCONTD: atorvastatin (LIPITOR) tablet 20 mg  20 mg Oral q1800 Vania Rea, MD   20 mg at 08/03/11 1706  . DISCONTD: ciprofloxacin (CIPRO) IVPB 400 mg  400 mg Intravenous Q12H Erick Blinks, MD   400 mg at 08/03/11 1130  . DISCONTD: ciprofloxacin (CIPRO) tablet 500 mg  500 mg Oral BID Erick Blinks, MD      . DISCONTD: cloNIDine (CATAPRES) tablet 0.3 mg  0.3 mg Oral TID Vania Rea, MD   0.3 mg at 08/03/11 1704  . DISCONTD: clopidogrel (PLAVIX) tablet 75 mg  75 mg Oral Q breakfast Vania Rea, MD   75 mg at 08/03/11 0759  . DISCONTD: diclofenac sodium (VOLTAREN) 1 % transdermal gel   Topical QID PRN Vania Rea, MD      . DISCONTD: enoxaparin (LOVENOX) injection 30 mg  30 mg Subcutaneous Q24H Vania Rea, MD   30 mg at 08/03/11 0910  . DISCONTD: enoxaparin (LOVENOX) injection 40 mg  40 mg Subcutaneous Q24H Erick Blinks, MD      . DISCONTD: fluticasone (FLONASE) 50 MCG/ACT nasal spray 1 spray  1 spray Each Nare Daily Vania Rea, MD      . DISCONTD: HYDROmorphone (DILAUDID) injection 0.5 mg  0.5 mg Intravenous Q2H PRN Vania Rea, MD      . DISCONTD: insulin aspart (novoLOG) injection 0-5 Units  0-5 Units Subcutaneous QHS Vania Rea, MD      . DISCONTD: insulin aspart (novoLOG) injection 0-9 Units  0-9 Units Subcutaneous TID WC Vania Rea, MD   2 Units at 08/03/11 1132  . DISCONTD: lamoTRIgine (LAMICTAL) tablet 100 mg  100 mg Oral Daily Vania Rea, MD   100 mg at  08/03/11 0910  . DISCONTD: levothyroxine (SYNTHROID, LEVOTHROID) tablet 50 mcg  50 mcg Oral QAC breakfast Vania Rea, MD   50 mcg at 08/03/11 0759  . DISCONTD: LORazepam (ATIVAN) tablet 0.5 mg  0.5 mg Oral TID PRN Erick Blinks, MD   0.5 mg at 08/03/11 0909  . DISCONTD: metoprolol (LOPRESSOR) tablet 50 mg  50 mg Oral BID Vania Rea, MD   50 mg at 08/03/11 0910  . DISCONTD: ondansetron (ZOFRAN) injection 4 mg  4 mg Intravenous Q6H PRN Vania Rea, MD      . DISCONTD: ondansetron [facility]) tablet 4 mg  4 mg Oral Q6H PRN Vania Rea, MD      . DISCONTD: oxyCODONE (Oxy IR/ROXICODONE) immediate release tablet 5 mg  5 mg Oral Q4H PRN Vania Rea, MD   5 mg at 08/03/11 1248  . DISCONTD: pantoprazole (PROTONIX) EC tablet 40 mg  40 mg Oral Q1200 Vania Rea, MD   40 mg at 08/03/11 1132  . DISCONTD: sertraline (ZOLOFT) tablet 150 mg  150  mg Oral QHS Vania Rea, MD   150 mg at 08/02/11 2208  . DISCONTD: sodium chloride 0.9 % injection 3 mL  3 mL Intravenous Q12H Vania Rea, MD   3 mL at 08/03/11 0910  . DISCONTD: traZODone (DESYREL) tablet 25 mg  25 mg Oral QHS PRN Vania Rea, MD        Allergies as of 08/01/2011 - Review Complete 08/01/2011  Allergen Reaction Noted  . Cephalexin Hives   . Milk-related compounds Other (See Comments) 07/29/2008  . Penicillins Hives   . Phenazopyridine hcl Other (See Comments) 06/19/2007    Review of Systems: Gen: Denies any fever, chills, sweats, anorexia, fatigue, weakness, malaise, weight loss, and sleep disorder. CV: Denies chest pain, angina, palpitations, syncope, orthopnea, PND, peripheral edema, and claudication. Resp: Denies dyspnea at rest, dyspnea with exercise, cough, sputum, wheezing, coughing up blood, and pleurisy. GI: Denies vomiting blood, jaundice, and fecal incontinence.  Derm: Denies rash, itching, dry skin, hives, moles, warts, or unhealing ulcers.  Psych: Denies depression, anxiety, memory loss,  suicidal ideation, hallucinations, paranoia, and confusion. Heme: Denies bruising, bleeding, and enlarged lymph nodes.  Physical Exam: BP 150/76  Pulse 91  Temp(Src) 98.1 F (36.7 C) (Temporal)  Ht 5\' 2"  (1.575 m)  Wt 154 lb 9.6 oz (70.126 kg)  BMI 28.28 kg/m2 General:   Alert, well-nourished, pleasant and cooperative in NAD Eyes:  Sclera clear, no icterus.   Conjunctiva pink. Mouth:  No deformity or lesions, oropharynx pink and moist. Neck:  Supple; no masses or thyromegaly. Heart:  Regular rate and rhythm; no murmurs, clicks, rubs,  or gallops. Abdomen:  Normal bowel sounds.  No bruits.  Soft, non-tender.  Mild epigastric fullness.  No masses, hepatosplenomegaly.  No guarding or rebound tenderness.   Rectal:  Deferred. Msk:  Symmetrical without gross deformities.  Pulses:  Normal pulses noted. Extremities:  No edema. Neurologic:  Alert and oriented x4;  grossly normal neurologically. Skin:  Intact without significant lesions or rashes.

## 2011-08-01 NOTE — ED Provider Notes (Signed)
History     CSN: [id number]  Arrival date & time 08/01/11  2239   First MD Initiated Contact with Patient 08/01/11 2306      Chief Complaint  Patient presents with  . Altered Mental Status    (Consider location/radiation/quality/duration/timing/severity/associated sxs/prior treatment) HPI Level 5 caveat due to [facility] [name] is a 61 y.o. female brought in by ambulance, who presents to the Emergency Department complaining of altered level of consciousness. Per EMS, they were called to the home earlier tonight for a fall. Patient had sat down in a chair [name] was unable to get up on her own. She used her life alert to call EMS. Once the call was completed, the patient did not want to be transported to the ER. An hour later, life alert called the patient. Her responses were blunted [name] she was not answering well. They alerted EMS. Patient states she had taken all her nighttime medicines.  Upon arrival in the ER, patient was arousable  with a mild sternal rub. States she cannot stay awake due to the night time medicines she has taken. No one has accompanied the patient.  PCP Dr. Lodema Hong    Past Medical History  Diagnosis Date  . Bipolar disorder   . CVA (cerebral infarction)   . Pancreatitis 2006    due to Depakote with normal EUS   . Osteoporosis   . Sleep apnea   . Chronic back pain   . Diabetes mellitus   . Trigger finger   . Anxiety disorder   . Hypertension   . Migraines   . Diverticulosis     TCS 9/08 by Dr. Lina Sar for diarrhea . Bx for micro scopic colitis negative.   . Schatzki's ring     non critical / EGD with ED 8/2011with RMR  . S/P colonoscopy 4098,1191, 2011    left-sided diverticula, hx of simple adenomas . 2011, random bx negative for microscopic colitis  . Glaucoma   . Allergic rhinitis   . Hypothyroidism     thyroid goiter  . Anemia   . Blood transfusion   . GERD (gastroesophageal reflux disease)   . Stroke     left sided weakness  . Seizures       unknown etiology-on meds-last seizure was 3 yerars ago  . Anxiety   . Depression     Past Surgical History  Procedure Date  . Abdominal hysterectomy   . Cholecystectomy   . Ovarian cyst removal   . Carpal tunnel release 07/22/04    left/ Dr. Romeo Apple   . Breast reduction surgery   . Bilateral cataract surgery   . Biopsy of thyroid gland 2009  . Surgical excision of 3 tumors from right thigh [name] right buttock  [name] left upper thigh 2010  . Back surgery July 2012  . Spine surgery 09/29/2010    dr brooks  . Maloney dilation 12/29/2010    Procedure: Elease Hashimoto DILATION;  Surgeon: Corbin Ade, MD;  Location: [facility];  Service: Endoscopy;  Laterality: N/A;  56, 58,  . Esophagogastroduodenoscopy 12/29/2010    Retained food in the esophagus [name] stomach, small hiatal hernia, status post dilation of the esophagus    Family History  Problem Relation Age of Onset  . Heart attack Mother   . Pneumonia Father   . Kidney failure Father   . Cancer Sister     pancreatic  . Diabetes Brother   . Hypertension Brother   . Colon cancer Neg  Hx   . Anesthesia problems Neg Hx   . Hypotension Neg Hx   . Malignant hyperthermia Neg Hx   . Pseudochol deficiency Neg Hx     History  Substance Use Topics  . Smoking status: Former Smoker    Quit date: 12/22/1992  . Smokeless tobacco: Not on file  . Alcohol Use: No    OB History    Grav Para Term Preterm Abortions TAB SAB Ect Mult Living                  Review of Systems  Unable to perform ROS: Mental status change    Allergies  Cephalexin; Milk-related compounds; Penicillins; [name] Phenazopyridine hcl  Home Medications   Current Outpatient Rx  Name Route Sig Dispense Refill  . ANTIVERT 12.5 MG PO TABS  TAKE 1 TABLET BY MOUTH 3 TIMES DAILY AS NEEDED FOR DIZZINESS. 30 each 3  . BD PEN NEEDLE MINI U/F 31G X 5 MM MISC  USE AS DIRECTED. 100 each 2  . CLONIDINE HCL 0.3 MG PO TABS  TAKE 1 TABLET BY MOUTH 3 TIMES DAILY: TAKE AT 8 A.M.;4  P.M.; [name] MIDNIGHT. 90 tablet 3  . CRESTOR 10 MG PO TABS  TAKE 1 TABLET BY MOUTH AT BEDTIME. 30 each 3  . CYCLOBENZAPRINE HCL 10 MG PO TABS Oral Take 0.5 tablets (5 mg total) by mouth 2 (two) times daily as needed for muscle spasms.    . CYCLOSPORINE 0.05 % OP EMUL  1 drop 2 (two) times daily.    [name] DIPHENOXYLATE-ATROPINE 2.5-0.025 MG PO TABS Oral Take 1 tablet by mouth 4 (four) times daily as needed. For loose stools    . ERGOCALCIFEROL 50000 UNITS PO CAPS Oral Take 50,000 Units by mouth once a week. Patient takes on Fridays.    [name] FLUTICASONE PROPIONATE 50 MCG/ACT NA SUSP Nasal Place 1 spray into the nose daily. Allergies 16 g 3  . GLIPIZIDE 10 MG PO TABS  TAKE 1 TABLET BY MOUTH TWICE DAILY. 60 tablet 3  . HYDROCHLOROTHIAZIDE 12.5 MG PO CAPS  TAKE ONE CAPSULE DAILY. 30 capsule 3  . INSULIN DETEMIR 100 UNIT/ML [place] SOLN Subcutaneous Inject 30 Units into the skin at bedtime.    . INSULIN GLARGINE 100 UNIT/ML [place] SOLN Subcutaneous Inject 30 Units into the skin at bedtime. 10 mL 12    Dose increase on lantus to 30 units daily, pt to t ...  . LAMOTRIGINE 100 MG PO TABS Oral Take 100 mg by mouth daily.      [name] LEVOTHYROXINE SODIUM 50 MCG PO TABS Oral Take 50 mcg by mouth daily.      [name] LORAZEPAM 1 MG PO TABS Oral Take 0.5 tablets (0.5 mg total) by mouth 3 (three) times daily. 30 tablet   . METFORMIN HCL 1000 MG PO TABS Oral Take 1 tablet (1,000 mg total) by mouth 2 (two) times daily with a meal. 60 tablet 11    Please note  That patient has changed from liquid  ...  . METOPROLOL TARTRATE 50 MG PO TABS Oral Take 0.5 tablets (25 mg total) by mouth 2 (two) times daily.    [name] MIRTAZAPINE 30 MG PO TABS Oral Take 30 mg by mouth at bedtime.     . NYSTATIN 100000 UNIT/GM EX POWD  APPLY TO AFFECTED AREAS TWICE DAILY. 60 g 2  . ONDANSETRON HCL 4 MG PO TABS Oral Take 1 tablet (4 mg total) by mouth every 8 (eight) hours as needed  for nausea. 30 tablet 1  . ONGLYZA 5 MG PO TABS  TAKE 1 TABLET BY MOUTH ONCE DAILY. 30  tablet 3  . OXYCODONE-ACETAMINOPHEN 10-325 MG PO TABS Oral Take 1 tablet by mouth 3 (three) times daily as needed. Pain    . PLAVIX 75 MG PO TABS  TAKE ONE TABLET BY MOUTH ONCE DAILY. 30 each 3    Dispense as written.  [name] POLYETHYLENE GLYCOL 3350 PO PACK Oral Take 17 g by mouth 2 (two) times daily.    [name] PREGABALIN 75 MG PO CAPS Oral Take 75 mg by mouth 2 (two) times daily.      [name] PROMETHAZINE HCL 12.5 MG PO TABS Oral Take 1 tablet (12.5 mg total) by mouth every 6 (six) hours as needed for nausea. 30 tablet 0  . RABEPRAZOLE SODIUM 20 MG PO TBEC Oral Take 1 tablet (20 mg total) by mouth daily. 31 tablet 11  . SERTRALINE HCL 100 MG PO TABS Oral Take 150 mg by mouth daily.    [name] SINGULAIR 10 MG PO TABS  TAKE ONE TABLET DAILY. 30 each 3  . THIOTHIXENE 2 MG PO CAPS Oral Take 2 mg by mouth 2 (two) times daily.     [name] TIZANIDINE HCL 4 MG PO TABS Oral Take 4 mg by mouth 3 (three) times daily as needed. For spasms    . TRAZODONE HCL 150 MG PO TABS      . TRIAMCINOLONE ACETONIDE 0.1 % EX CREA      . VOLTAREN 1 % TD GEL  APPLY TWICE DAILY TO AFFECTED AREA(S) AS NEEDED FOR PAIN. 100 g 1    BP 108/56  Pulse 66  Temp(Src) 98.4 F (36.9 C) (Oral)  Resp 18  Ht 5\' 3"  (1.6 m)  Wt 154 lb (69.854 kg)  BMI 27.28 kg/m2  SpO2 98%  Physical Exam  Nursing note [name] vitals reviewed. Constitutional:       Sleepy, arousable,nontoxic appearance.Will not stay awake long enough to answer questions.   HENT:  Head: Atraumatic.  Eyes: EOM are normal. Pupils are equal, round, [name] reactive to light. Right eye exhibits no discharge. Left eye exhibits no discharge.       Pupils 2 mm  Neck: Neck supple. No JVD present.  Cardiovascular: Normal rate, normal heart sounds [name] intact distal pulses.   Pulmonary/Chest: Effort normal. She exhibits no tenderness.  Abdominal: Soft. Bowel sounds are normal. There is no tenderness. There is no rebound.  Musculoskeletal: She exhibits no tenderness.       Baseline ROM, no obvious new  focal weakness.  Neurological:       motor strength appears baseline for patient [name] situation.Arousable, speech is normal, no facial asymetry.  Skin: Skin is warm [name] dry. No rash noted.  Psychiatric: She has a normal mood [name] affect.    ED Course  Procedures (including critical care time)  Results for orders placed during the hospital encounter of 08/01/11  CBC      Component Value Range   WBC 7.0  4.0 - 10.5 (K/uL)   RBC 4.59  3.87 - 5.11 (MIL/uL)   Hemoglobin 10.0 (*) 12.0 - 15.0 (g/dL)   HCT 11.9 (*) 14.7 - 46.0 (%)   MCV 71.5 (*) 78.0 - 100.0 (fL)   MCH 21.8 (*) 26.0 - 34.0 (pg)   MCHC 30.5  30.0 - 36.0 (g/dL)   RDW 82.9  56.2 - 13.0 (%)   Platelets 196  150 - 400 (K/uL)  DIFFERENTIAL  Component Value Range   Neutrophils Relative 42 (*) 43 - 77 (%)   Lymphocytes Relative 48 (*) 12 - 46 (%)   Monocytes Relative 8  3 - 12 (%)   Eosinophils Relative 2  0 - 5 (%)   Basophils Relative 0  0 - 1 (%)   Neutro Abs 2.9  1.7 - 7.7 (K/uL)   Lymphs Abs 3.4  0.7 - 4.0 (K/uL)   Monocytes Absolute 0.6  0.1 - 1.0 (K/uL)   Eosinophils Absolute 0.1  0.0 - 0.7 (K/uL)   Basophils Absolute 0.0  0.0 - 0.1 (K/uL)   Smear Review MORPHOLOGY UNREMARKABLE    BASIC METABOLIC PANEL      Component Value Range   Sodium 140  135 - 145 (mEq/L)   Potassium 3.9  3.5 - 5.1 (mEq/L)   Chloride 102  96 - 112 (mEq/L)   CO2 26  19 - 32 (mEq/L)   Glucose, Bld 90  70 - 99 (mg/dL)   BUN 22  6 - 23 (mg/dL)   Creatinine, Ser 1.61 (*) 0.50 - 1.10 (mg/dL)   Calcium 9.8  8.4 - 09.6 (mg/dL)   GFR calc non Af Amer 36 (*) >90 (mL/min)   GFR calc Af Amer 42 (*) >90 (mL/min)  URINALYSIS, ROUTINE W REFLEX MICROSCOPIC      Component Value Range   Color, Urine AMBER (*) YELLOW    APPearance CLEAR  CLEAR    Specific Gravity, Urine 1.025  1.005 - 1.030    pH 6.0  5.0 - 8.0    Glucose, UA NEGATIVE  NEGATIVE (mg/dL)   Hgb urine dipstick TRACE (*) NEGATIVE    Bilirubin Urine NEGATIVE  NEGATIVE    Ketones, ur  NEGATIVE  NEGATIVE (mg/dL)   Protein, ur NEGATIVE  NEGATIVE (mg/dL)   Urobilinogen, UA 0.2  0.0 - 1.0 (mg/dL)   Nitrite POSITIVE (*) NEGATIVE    Leukocytes, UA TRACE (*) NEGATIVE   TROPONIN I      Component Value Range   Troponin I <0.30  <0.30 (ng/mL)  URINE MICROSCOPIC-ADD ON      Component Value Range   Squamous Epithelial / LPF MANY (*) RARE    WBC, UA 3-6  <3 (WBC/hpf)   RBC / HPF 3-6  <3 (RBC/hpf)   Bacteria, UA MANY (*) RARE    Casts HYALINE CASTS (*) NEGATIVE     Date: 08/01/2011  2303  Rate: 63  Rhythm: normal sinus rhythm  QRS Axis: normal  Intervals: normal  ST/T Wave abnormalities: normal  Conduction Disutrbances: none  Narrative Interpretation: unremarkable, no sig change c/w 04/27/2011  Ct Head Wo Contrast  08/02/2011  *RADIOLOGY REPORT*  Clinical Data: Acute mental status changes with lethargy.  History of hypertension, migraines, stroke, [name] seizures.  CT HEAD WITHOUT CONTRAST  Technique:  Contiguous axial images were obtained from the base of the skull through the vertex without contrast.  Comparison: Unenhanced cranial CT 12/12/2010, 05/16/2010, 03/30/2010, 10/31/2009 , 05/29/2009, 08/03/2008, 07/05/2008.  Findings: Ventricular system normal in size [name] appearance for age. Midline mass in the frontal region just above the cribiform plate with associated calcifications, as noted previously.  No mass lesions elsewhere.  No midline shift.  No acute hemorrhage or hematoma.  No extra-axial fluid collections.  Geographic low attenuation in the right frontal lobe.  No focal brain parenchymal abnormalities elsewhere.  Mild changes of hyperostosis frontalis interna. Visualized paranasal sinuses, mastoid air cells, [name] middle ear cavities well- aerated.  Bilateral carotid siphon [name]  left vertebral artery atherosclerosis.  IMPRESSION:  1.  Possible subacute non-hemorrhagic right frontal lobe stroke, new since the examination 3 months ago. 2.  Stable partially calcified meningioma  arising in the midline frontal region from the cribiform plate.  Original Report Authenticated By: Arnell Sieving, M.D.   Dg Chest Port 1 View  08/02/2011  *RADIOLOGY REPORT*  Clinical Data: Acute mental status changes.  History of diabetes [name] hypertension.  Former smoker.  PORTABLE CHEST - 1 VIEW 08/01/2011 23:22 hours:  Comparison: Portable chest x-ray 04/27/2011, 03/30/2010.  Two-view chest x-ray 08/23/2010.  Findings: Suboptimal inspiration due to body habitus which accounts for atelectasis at the bases [name] accentuates the cardiac silhouette.  Taking this into account, cardiac silhouette upper normal in size, unchanged.  Lungs otherwise clear.  IMPRESSION: Suboptimal inspiration accounts for bibasilar atelectasis.  No acute cardiopulmonary disease otherwise.  Original Report Authenticated By: Arnell Sieving, M.D.   3:43 AM:  T/C toDr. Orvan Falconer, hospitalist, case discussed, including:  HPI, pertinent PM/SHx, VS/PE, dx testing, ED course [name] treatment.  Agreeable toadmission for observation.   Requests to write temporary orders,telemetry bed.     MDM  Patient with altered level of consciousness. By EMS account, patient had some weakness earlier in the evening [name] subsequently had taken her nighttime medicines. The result was that she was unable to speak coherently to the [place] people. Since arrival in the emergency room she has been arousable only to stimulus. Answer some questions appropriately speech is clear. Appears to be under the influence of her nighttime medicines. Labs show a renal insufficiency [name] an anemia. Chest x-ray is unremarkable. CT of her head shows a possible subacute nonhemorrhagic right frontal lobe stroke [name] is new from the examination done 3 months ago. She has a history of stroke [name] TIAs. Her change in mental status could be do to a new stroke. Her vital signs have remained stable during her time in the emergency room. Will arrange for admission.Spoke with Dr.  Orvan Falconer, hospitalist, who will admit the patient for observation. Pt stable in ED with no significant deterioration in condition.The patient appears reasonably stabilized for admission considering the current resources, flow, [name] capabilities available in the ED at this time, [name] I doubt any other [facility] requiring further screening [name]/or treatment in the ED prior to admission.  MDM Reviewed: nursing note [name] vitals Interpretation: labs, ECG, x-ray [name] CT scan           Nicoletta Dress. Colon Branch, MD 08/02/11 1610

## 2011-08-01 NOTE — Patient Instructions (Signed)
You need to resume your insulin as soon as possible Continue MiraLax 17 g daily as needed for constipation Continue AcipHex 20 mg daily We will call you with results of your x-ray Call if you're left-sided abdominal pain becomes worse

## 2011-08-02 ENCOUNTER — Encounter (HOSPITAL_COMMUNITY): Payer: Self-pay | Admitting: *Deleted

## 2011-08-02 DIAGNOSIS — N179 Acute kidney failure, unspecified: Secondary | ICD-10-CM | POA: Diagnosis present

## 2011-08-02 DIAGNOSIS — IMO0002 Reserved for concepts with insufficient information to code with codable children: Secondary | ICD-10-CM

## 2011-08-02 DIAGNOSIS — R5383 Other fatigue: Secondary | ICD-10-CM | POA: Diagnosis present

## 2011-08-02 DIAGNOSIS — I69359 Hemiplegia and hemiparesis following cerebral infarction affecting unspecified side: Secondary | ICD-10-CM

## 2011-08-02 DIAGNOSIS — G9341 Metabolic encephalopathy: Secondary | ICD-10-CM

## 2011-08-02 DIAGNOSIS — E119 Type 2 diabetes mellitus without complications: Secondary | ICD-10-CM

## 2011-08-02 DIAGNOSIS — F3130 Bipolar disorder, current episode depressed, mild or moderate severity, unspecified: Secondary | ICD-10-CM

## 2011-08-02 HISTORY — DX: Reserved for concepts with insufficient information to code with codable children: IMO0002

## 2011-08-02 LAB — DIFFERENTIAL
Eosinophils Relative: 2 % (ref 0–5)
Monocytes Relative: 8 % (ref 3–12)
Neutrophils Relative %: 42 % — ABNORMAL LOW (ref 43–77)

## 2011-08-02 LAB — GLUCOSE, CAPILLARY
Glucose-Capillary: 114 mg/dL — ABNORMAL HIGH (ref 70–99)
Glucose-Capillary: 115 mg/dL — ABNORMAL HIGH (ref 70–99)
Glucose-Capillary: 136 mg/dL — ABNORMAL HIGH (ref 70–99)
Glucose-Capillary: 164 mg/dL — ABNORMAL HIGH (ref 70–99)

## 2011-08-02 LAB — BASIC METABOLIC PANEL
CO2: 26 mEq/L (ref 19–32)
Chloride: 102 mEq/L (ref 96–112)
Potassium: 3.9 mEq/L (ref 3.5–5.1)
Sodium: 140 mEq/L (ref 135–145)

## 2011-08-02 LAB — HEPATIC FUNCTION PANEL
ALT: 18 U/L (ref 0–35)
Alkaline Phosphatase: 80 U/L (ref 39–117)
Bilirubin, Direct: <0.1 mg/dL (ref 0.0–0.3)
Total Bilirubin: 0.1 mg/dL — ABNORMAL LOW (ref 0.3–1.2)

## 2011-08-02 LAB — URINALYSIS, ROUTINE W REFLEX MICROSCOPIC
Glucose, UA: NEGATIVE mg/dL
pH: 6 (ref 5.0–8.0)

## 2011-08-02 LAB — CBC
MCV: 71.5 fL — ABNORMAL LOW (ref 78.0–100.0)
Platelets: 196 10*3/uL (ref 150–400)
RBC: 4.59 MIL/uL (ref 3.87–5.11)
WBC: 7 10*3/uL (ref 4.0–10.5)

## 2011-08-02 LAB — TROPONIN I: Troponin I: <0.3 ng/mL (ref <0.30)

## 2011-08-02 LAB — HEMOGLOBIN A1C
Hgb A1c MFr Bld: 6.4 % — ABNORMAL HIGH (ref <5.7)
Mean Plasma Glucose: 137 mg/dL — ABNORMAL HIGH (ref <117)

## 2011-08-02 LAB — URINE MICROSCOPIC-ADD ON

## 2011-08-02 MED ORDER — TRAZODONE HCL 50 MG PO TABS: 25.0000 mg | ORAL_TABLET | Freq: Every evening | ORAL | Status: DC | PRN

## 2011-08-02 MED ORDER — FLUTICASONE PROPIONATE 50 MCG/ACT NA SUSP
1.0000 | Freq: Every day | NASAL | Status: DC
  Filled 2011-08-02: qty 16

## 2011-08-02 MED ORDER — PANTOPRAZOLE SODIUM 40 MG PO TBEC
40.0000 mg | DELAYED_RELEASE_TABLET | Freq: Every day | ORAL | Status: DC
  Administered 2011-08-02 – 2011-08-03 (×2): 40 mg via ORAL
  Filled 2011-08-02 (×2): qty 1

## 2011-08-02 MED ORDER — INSULIN ASPART 100 UNIT/ML ~~LOC~~ SOLN
0.0000 [IU] | Freq: Three times a day (TID) | SUBCUTANEOUS | Status: DC
  Administered 2011-08-03 (×2): 2 [IU] via SUBCUTANEOUS

## 2011-08-02 MED ORDER — CLONIDINE HCL 0.2 MG PO TABS
0.3000 mg | ORAL_TABLET | Freq: Three times a day (TID) | ORAL | Status: DC
  Administered 2011-08-02 – 2011-08-03 (×3): 0.3 mg via ORAL
  Filled 2011-08-02 (×5): qty 1

## 2011-08-02 MED ORDER — SERTRALINE HCL 50 MG PO TABS
150.0000 mg | ORAL_TABLET | Freq: Every day | ORAL | Status: DC
  Administered 2011-08-02: 150 mg via ORAL
  Filled 2011-08-02: qty 3

## 2011-08-02 MED ORDER — LAMOTRIGINE 100 MG PO TABS
100.0000 mg | ORAL_TABLET | Freq: Every day | ORAL | Status: DC
  Administered 2011-08-02 – 2011-08-03 (×2): 100 mg via ORAL
  Filled 2011-08-02 (×3): qty 1

## 2011-08-02 MED ORDER — ACETAMINOPHEN 650 MG RE SUPP: 650.0000 mg | Freq: Four times a day (QID) | RECTAL | Status: DC | PRN

## 2011-08-02 MED ORDER — ACETAMINOPHEN 325 MG PO TABS: 650.0000 mg | ORAL_TABLET | Freq: Four times a day (QID) | ORAL | Status: DC | PRN

## 2011-08-02 MED ORDER — THIOTHIXENE 2 MG PO CAPS: 2.0000 mg | ORAL_CAPSULE | Freq: Every day | ORAL | Status: DC

## 2011-08-02 MED ORDER — HYDROMORPHONE HCL PF 1 MG/ML IJ SOLN: 0.5000 mg | INTRAMUSCULAR | Status: DC | PRN

## 2011-08-02 MED ORDER — POTASSIUM CHLORIDE IN NACL 20-0.9 MEQ/L-% IV SOLN
INTRAVENOUS | Status: DC
  Administered 2011-08-02 – 2011-08-03 (×3): via INTRAVENOUS

## 2011-08-02 MED ORDER — LEVOTHYROXINE SODIUM 50 MCG PO TABS
50.0000 ug | ORAL_TABLET | Freq: Every day | ORAL | Status: DC
  Administered 2011-08-02 – 2011-08-03 (×2): 50 ug via ORAL
  Filled 2011-08-02 (×2): qty 1

## 2011-08-02 MED ORDER — OXYCODONE HCL 5 MG PO TABS
5.0000 mg | ORAL_TABLET | ORAL | Status: DC | PRN
  Administered 2011-08-02 – 2011-08-03 (×3): 5 mg via ORAL
  Filled 2011-08-02 (×3): qty 1

## 2011-08-02 MED ORDER — ENOXAPARIN SODIUM 30 MG/0.3ML ~~LOC~~ SOLN
30.0000 mg | SUBCUTANEOUS | Status: DC
  Administered 2011-08-02 – 2011-08-03 (×2): 30 mg via SUBCUTANEOUS
  Filled 2011-08-02 (×2): qty 0.3

## 2011-08-02 MED ORDER — SODIUM CHLORIDE 0.9 % IJ SOLN
INTRAMUSCULAR | Status: AC
  Administered 2011-08-02: 10 mL via INTRAVENOUS
  Filled 2011-08-02: qty 3

## 2011-08-02 MED ORDER — DICLOFENAC SODIUM 1 % TD GEL
Freq: Four times a day (QID) | TRANSDERMAL | Status: DC | PRN
  Filled 2011-08-02: qty 100

## 2011-08-02 MED ORDER — SODIUM CHLORIDE 0.9 % IJ SOLN
3.0000 mL | Freq: Two times a day (BID) | INTRAMUSCULAR | Status: DC
  Administered 2011-08-02 (×2): 3 mL via INTRAVENOUS
  Administered 2011-08-02: 10 mL via INTRAVENOUS
  Administered 2011-08-03: 3 mL via INTRAVENOUS
  Filled 2011-08-02 (×3): qty 3

## 2011-08-02 MED ORDER — CLOPIDOGREL BISULFATE 75 MG PO TABS
75.0000 mg | ORAL_TABLET | Freq: Every day | ORAL | Status: DC
  Administered 2011-08-02 – 2011-08-03 (×2): 75 mg via ORAL
  Filled 2011-08-02 (×2): qty 1

## 2011-08-02 MED ORDER — INSULIN ASPART 100 UNIT/ML ~~LOC~~ SOLN: 0.0000 [IU] | Freq: Every day | SUBCUTANEOUS | Status: DC

## 2011-08-02 MED ORDER — METOPROLOL TARTRATE 50 MG PO TABS
50.0000 mg | ORAL_TABLET | Freq: Two times a day (BID) | ORAL | Status: DC
  Administered 2011-08-02 – 2011-08-03 (×3): 50 mg via ORAL
  Filled 2011-08-02 (×3): qty 1

## 2011-08-02 MED ORDER — SODIUM CHLORIDE 0.9 % IV SOLN
INTRAVENOUS | Status: AC
  Administered 2011-08-02: 04:00:00 via INTRAVENOUS

## 2011-08-02 MED ORDER — CIPROFLOXACIN IN D5W 400 MG/200ML IV SOLN
400.0000 mg | Freq: Two times a day (BID) | INTRAVENOUS | Status: DC
  Administered 2011-08-02 – 2011-08-03 (×3): 400 mg via INTRAVENOUS
  Filled 2011-08-02 (×5): qty 200

## 2011-08-02 MED ORDER — ATORVASTATIN CALCIUM 20 MG PO TABS
20.0000 mg | ORAL_TABLET | Freq: Every day | ORAL | Status: DC
  Administered 2011-08-02 – 2011-08-03 (×2): 20 mg via ORAL
  Filled 2011-08-02 (×2): qty 1

## 2011-08-02 MED ORDER — ONDANSETRON HCL 4 MG/2ML IJ SOLN: 4.0000 mg | Freq: Four times a day (QID) | INTRAMUSCULAR | Status: DC | PRN

## 2011-08-02 MED ORDER — LORAZEPAM 0.5 MG PO TABS
0.5000 mg | ORAL_TABLET | Freq: Three times a day (TID) | ORAL | Status: DC | PRN
  Administered 2011-08-02 – 2011-08-03 (×3): 0.5 mg via ORAL
  Filled 2011-08-02 (×3): qty 1

## 2011-08-02 MED ORDER — GLIPIZIDE 5 MG PO TABS: 10.0000 mg | ORAL_TABLET | Freq: Every day | ORAL | Status: DC

## 2011-08-02 MED ORDER — ONDANSETRON HCL 4 MG PO TABS: 4.0000 mg | ORAL_TABLET | Freq: Four times a day (QID) | ORAL | Status: DC | PRN

## 2011-08-02 NOTE — Evaluation (Signed)
Physical Therapy Evaluation Patient Details Name: [name] MRN: [id number] DOB: 15-Oct-1950 Today's Date: 08/02/2011 Time: 4098-1191 PT Time Calculation (min): 42 min  PT Assessment / Plan / Recommendation Clinical Impression  Pt is alert and cooperative, appears to be very close to prior functional level.  No PT is indicated as pt is content with her functional status.    PT Assessment  Patent does not need any further PT services    Follow Up Recommendations  No PT follow up    Barriers to Discharge        lEquipment Recommendations  None recommended by PT    Recommendations for Other Services     Frequency      Precautions / Restrictions Precautions Precautions: Fall Restrictions Weight Bearing Restrictions: No   Pertinent Vitals/Pain       Mobility  Bed Mobility Bed Mobility: Supine to Sit;Sit to Supine Supine to Sit: 6: Modified independent (Device/Increase time);HOB elevated Sit to Supine: 6: Modified independent (Device/Increase time);HOB elevated Transfers Transfers: Sit to Stand;Stand to Sit Sit to Stand: 5: Supervision;With upper extremity assist;From bed Stand to Sit: 6: Modified independent (Device/Increase time);With upper extremity assist Ambulation/Gait Ambulation/Gait Assistance: 6: Modified independent (Device/Increase time) Ambulation Distance (Feet): 20 Feet Assistive device: Rolling walker Gait Pattern: Within Functional Limits;Shuffle;Trunk flexed Gait velocity: very slow pace General Gait Details: gait stability is much better than what one would expect based on MMT Stairs: No Wheelchair Mobility Wheelchair Mobility: No    Exercises     PT Diagnosis:    PT Problem List:   PT Treatment Interventions:     PT Goals    Visit Information  Last PT Received On: 08/02/11    Subjective Data  Subjective: feels better Patient Stated Goal: return home   Prior Functioning  Home Living Lives With: Alone Available Help at Discharge:  Personal care attendant Type of Home: Apartment Home Access: Level entry Home Layout: One level Bathroom Shower/Tub: Engineer, manufacturing systems: Standard Bathroom Accessibility: Yes How Accessible: Accessible via walker Home Adaptive Equipment: Hospital bed;Bedside commode/3-in-1;Straight cane;Walker - rolling;Wheelchair - manual Prior Function Level of Independence: Needs assistance Needs Assistance: Light Housekeeping;Meal Prep;Bathing    Cognition  Overall Cognitive Status: Appears within functional limits for tasks assessed/performed Arousal/Alertness: Awake/alert Orientation Level: Appears intact for tasks assessed Behavior During Session: [facility] for tasks performed    Extremity/Trunk Assessment Right Upper Extremity Assessment RUE ROM/Strength/Tone: WFL for tasks assessed RUE Sensation: WFL - Light Touch;WFL - Proprioception RUE Coordination: WFL - gross motor Left Upper Extremity Assessment LUE ROM/Strength/Tone: WFL for tasks assessed LUE Sensation: WFL - Light Touch;WFL - Proprioception LUE Coordination: WFL - gross motor Right Lower Extremity Assessment RLE ROM/Strength/Tone: Deficits RLE ROM/Strength/Tone Deficits: strength 2/5 on MMT RLE Sensation: WFL - Light Touch;WFL - Proprioception RLE Coordination: WFL - gross motor Left Lower Extremity Assessment LLE ROM/Strength/Tone: Deficits LLE ROM/Strength/Tone Deficits: strength on MMT is 2+/5 LLE Sensation: WFL - Light Touch;WFL - Proprioception LLE Coordination: WFL - gross motor Trunk Assessment Trunk Assessment: Kyphotic   Balance Balance Balance Assessed: No  End of Session PT - End of Session Equipment Utilized During Treatment: Gait belt Activity Tolerance: Patient tolerated treatment well Patient left: in bed;with call bell/phone within reach;with bed alarm set Nurse Communication: Mobility status   Konrad Penta 08/02/2011, 2:56 PM

## 2011-08-02 NOTE — Telephone Encounter (Signed)
Called patient and left message for them to return call at the office   

## 2011-08-02 NOTE — Progress Notes (Signed)
UR Chart Review Completed  

## 2011-08-02 NOTE — H&P (Signed)
PCP:   Syliva Overman, MD, MD   Chief Complaint:  Lethargy after a fall  HPI: [name] is an 61 y.o. female.   Middle-aged [place] American lady who lives alone and has multiple medical problems, including diabetes, hypertension, chronic pain,obstructive sleep apnea, bipolar disorder and she is on multiple psychotropic medications. She also has a left hemiplegia status post remote stroke, and is mobile usually by wheelchair.   Patient reportedly pressed her life alert button last evening after she fell into a chair and EMS was sent to check on her. Found her in a stable condition and she declined transportation to the hospital. Later in the night life alert service call back to check on her and assess her speech has been slurred and unintelligible and EMS was sent back to re-evaluate. They found her lethargic though alert enough to tell them that she had taken all her night medication. She was brought to the emergency room and after many hours remains lethargic though arousable. She has a CT scan of questionable abnormality and the hospitalist service was called to assist with management.   urinalysis is is contaminated and unable to confirm possible urinary tract infection Her creatinine is double what it was one month ago.   Rewiew of Systems:  Because of extreme lethargy review of systems is unreliable.   Past Medical History  Diagnosis Date  . Bipolar disorder   . CVA (cerebral infarction)   . Pancreatitis 2006    due to Depakote with normal EUS   . Osteoporosis   . Sleep apnea   . Chronic back pain   . Diabetes mellitus   . Trigger finger   . Anxiety disorder   . Hypertension   . Migraines   . Diverticulosis     TCS 9/08 by Dr. Lina Sar for diarrhea . Bx for micro scopic colitis negative.   . Schatzki's ring     non critical / EGD with ED 8/2011with RMR  . S/P colonoscopy 7829,5621, 2011    left-sided diverticula, hx of simple adenomas . 2011, random bx negative for  microscopic colitis  . Glaucoma   . Allergic rhinitis   . Hypothyroidism     thyroid goiter  . Anemia   . Blood transfusion   . GERD (gastroesophageal reflux disease)   . Stroke     left sided weakness  . Seizures     unknown etiology-on meds-last seizure was 3 yerars ago  . Anxiety   . Depression     Past Surgical History  Procedure Date  . Abdominal hysterectomy   . Cholecystectomy   . Ovarian cyst removal   . Carpal tunnel release 07/22/04    left/ Dr. Romeo Apple   . Breast reduction surgery   . Bilateral cataract surgery   . Biopsy of thyroid gland 2009  . Surgical excision of 3 tumors from right thigh and right buttock  and left upper thigh 2010  . Back surgery July 2012  . Spine surgery 09/29/2010    dr brooks  . Maloney dilation 12/29/2010    Procedure: Elease Hashimoto DILATION;  Surgeon: Corbin Ade, MD;  Location: [facility];  Service: Endoscopy;  Laterality: N/A;  56, 58,  . Esophagogastroduodenoscopy 12/29/2010    Retained food in the esophagus and stomach, small hiatal hernia, status post dilation of the esophagus    Medications:  HOME MEDS: Prior to Admission medications   Medication Sig Start Date End Date Taking? Authorizing Provider  ANTIVERT 12.5 MG  tablet TAKE 1 TABLET BY MOUTH 3 TIMES DAILY AS NEEDED FOR DIZZINESS. 01/06/11  Yes Kerri Perches, MD  cloNIDine (CATAPRES) 0.3 MG tablet TAKE 1 TABLET BY MOUTH 3 TIMES DAILY: TAKE AT 8 A.M.;4 P.M.; AND MIDNIGHT. 07/21/11  Yes Kerri Perches, MD  CRESTOR 10 MG tablet TAKE 1 TABLET BY MOUTH AT BEDTIME. 07/21/11  Yes Kerri Perches, MD  cyclobenzaprine (FLEXERIL) 10 MG tablet Take 0.5 tablets (5 mg total) by mouth 2 (two) times daily as needed for muscle spasms. 04/28/11  Yes Christiane Ha, MD  diphenoxylate-atropine (LOMOTIL) 2.5-0.025 MG per tablet Take 1 tablet by mouth 4 (four) times daily as needed. For loose stools 07/17/11 07/16/12 Yes Tiffany Kocher, PA  ergocalciferol (VITAMIN D2) 50000 UNITS capsule  Take 50,000 Units by mouth once a week. Patient takes on Sundays   Yes Historical Provider, MD  glipiZIDE (GLUCOTROL) 10 MG tablet Take 10 mg by mouth at bedtime.  03/24/11  Yes Kerri Perches, MD  hydrochlorothiazide (MICROZIDE) 12.5 MG capsule Take 12.5 mg by mouth daily.  07/21/11  Yes Kerri Perches, MD  lamoTRIgine (LAMICTAL) 100 MG tablet Take 100 mg by mouth every morning.    Yes Historical Provider, MD  levothyroxine (LEVOXYL) 50 MCG tablet Take 50 mcg by mouth every morning.    Yes Historical Provider, MD  LORazepam (ATIVAN) 1 MG tablet Take 0.5 tablets (0.5 mg total) by mouth 3 (three) times daily. 04/28/11  Yes Christiane Ha, MD  metFORMIN (GLUCOPHAGE) 1000 MG tablet Take 1 tablet (1,000 mg total) by mouth 2 (two) times daily with a meal. 03/17/11 03/16/12 Yes Kerri Perches, MD  metoprolol (LOPRESSOR) 50 MG tablet Take 50 mg by mouth 2 (two) times daily. 04/28/11  Yes Christiane Ha, MD  mirtazapine (REMERON) 30 MG tablet Take 30 mg by mouth at bedtime.    Yes Historical Provider, MD  montelukast (SINGULAIR) 10 MG tablet  07/21/11  Yes Kerri Perches, MD  ONGLYZA 5 MG TABS tablet TAKE 1 TABLET BY MOUTH ONCE DAILY. 07/21/11  Yes Kerri Perches, MD  oxyCODONE-acetaminophen (PERCOCET) 10-325 MG per tablet Take 1 tablet by mouth 3 (three) times daily as needed. Pain 04/28/11  Yes Christiane Ha, MD  PLAVIX 75 MG tablet TAKE ONE TABLET BY MOUTH ONCE DAILY. 07/21/11  Yes Kerri Perches, MD  pregabalin (LYRICA) 75 MG capsule Take 75 mg by mouth at bedtime.    Yes Historical Provider, MD  promethazine (PHENERGAN) 12.5 MG tablet Take 12.5 mg by mouth every 6 (six) hours as needed. For nausea   Yes Historical Provider, MD  RABEprazole (ACIPHEX) 20 MG tablet Take 1 tablet (20 mg total) by mouth daily. 06/19/11  Yes Tiffany Kocher, PA  sertraline (ZOLOFT) 100 MG tablet Take 150 mg by mouth at bedtime.    Yes Historical Provider, MD  thiothixene (NAVANE) 2 MG capsule Take 2 mg  by mouth at bedtime.  07/28/11  Yes Historical Provider, MD  tiZANidine (ZANAFLEX) 4 MG tablet Take 4 mg by mouth 3 (three) times daily as needed. For spasms 07/13/11  Yes Historical Provider, MD  B-D UF III MINI PEN NEEDLES 31G X 5 MM MISC USE AS DIRECTED. 07/13/11   Kerri Perches, MD  cycloSPORINE (RESTASIS) 0.05 % ophthalmic emulsion 1 drop 2 (two) times daily.    Historical Provider, MD  fluticasone (FLONASE) 50 MCG/ACT nasal spray Place 1 spray into the nose daily. Allergies 06/14/11   Milus Mallick  [name], MD  insulin detemir (LEVEMIR) 100 UNIT/ML injection Inject 30 Units into the skin at bedtime.    Historical Provider, MD  insulin glargine (LANTUS SOLOSTAR) 100 UNIT/ML injection Inject 30 Units into the skin at bedtime. 03/17/11 03/16/12  Kerri Perches, MD  nystatin (NYSTOP) 100000 UNIT/GM POWD APPLY TO AFFECTED AREAS TWICE DAILY. 07/10/11   Kerri Perches, MD  ondansetron (ZOFRAN) 4 MG tablet Take 1 tablet (4 mg total) by mouth every 8 (eight) hours as needed for nausea. 03/30/11 03/29/12  Nira Retort, NP  polyethylene glycol (MIRALAX / Ethelene Hal) packet Take 17 g by mouth 2 (two) times daily.    Historical Provider, MD  promethazine (PHENERGAN) 12.5 MG tablet Take 1 tablet (12.5 mg total) by mouth every 6 (six) hours as needed for nausea. 07/14/11 07/21/11  Kerri Perches, MD  traZODone (DESYREL) 150 MG tablet  05/23/11   Historical Provider, MD  triamcinolone cream (KENALOG) 0.1 %  05/16/11   Historical Provider, MD  VOLTAREN 1 % GEL APPLY TWICE DAILY TO AFFECTED AREA(S) AS NEEDED FOR PAIN. 06/20/11   Kerri Perches, MD     Allergies:  Allergies  Allergen Reactions  . Cephalexin Hives  . Milk-Related Compounds Other (See Comments)    Doesn't agree with stomach.   . Penicillins Hives  . Phenazopyridine Hcl Other (See Comments)    [place]      Social History:   reports that she quit smoking about 18 years ago. She does not have any smokeless tobacco history on file. She reports  that she does not drink alcohol or use illicit drugs.  Family History: Family History  Problem Relation Age of Onset  . Heart attack Mother   . Pneumonia Father   . Kidney failure Father   . Cancer Sister     pancreatic  . Diabetes Brother   . Hypertension Brother   . Colon cancer Neg Hx   . Anesthesia problems Neg Hx   . Hypotension Neg Hx   . Malignant hyperthermia Neg Hx   . Pseudochol deficiency Neg Hx      Physical Exam: Filed Vitals:   08/02/11 0026 08/02/11 0134 08/02/11 0238 08/02/11 0407  BP: 101/56 108/61 115/56 114/55  Pulse: 58 61 60   Temp:    98.2 F (36.8 C)  TempSrc:      Resp: 20   18  Height:      Weight:      SpO2: 97% 98% 96% 96%   Blood pressure 114/55, pulse 60, temperature 98.2 F (36.8 C), temperature source Oral, resp. rate 18, height 5\' 3"  (1.6 m), weight 69.854 kg (154 lb), SpO2 96.00%.  GEN:   lethargic obese middle-aged African American lady lying in the stretcher ;  unable to becooperative with exam PSYCH:  alert and oriented x2; she is aware of her location but unable to state why she is here. HEENT: Mucous membranes pink, dry , and anicteric; PERRLA; EOM intact; no cervical lymphadenopathy nor thyromegaly ; epitrochlea lymph nodes noted.  Breasts:: Not examined CHEST WALL: No tenderness CHEST: Normal respiration, clear to auscultation bilaterally HEART: Regular rate and rhythm; no murmurs rubs or gallops BACK: ; no CVA tenderness ABDOMEN: Obese, soft non-tender; no masses, no organomegaly, normal abdominal bowel sounds;  Large  pannus; no intertriginous candida. Rectal Exam: Not done EXTREMITIES:  age-appropriate arthropathy of the hands and knees; no edema; no ulcerations. some wasting of the left lower  Genitalia: not examined PULSES: 2+ and symmetric  SKIN: Normal hydration no rash or ulceration CNS: Cranial nerves 2-12 grossly intact;  2/5  power in the left lower extremity; 3/5 weakness of bilateral upper extremity; but this is  difficult to truly assess because of extreme lethargy; reflexes are decreased in the left lower extremity    Labs & Imaging Results for orders placed during the hospital encounter of 08/01/11 (from the past 48 hour(s))  CBC     Status: Abnormal   Collection Time   08/01/11 11:26 PM      Component Value Range Comment   WBC 7.0  4.0 - 10.5 (K/uL)    RBC 4.59  3.87 - 5.11 (MIL/uL)    Hemoglobin 10.0 (*) 12.0 - 15.0 (g/dL)    HCT 16.1 (*) 09.6 - 46.0 (%)    MCV 71.5 (*) 78.0 - 100.0 (fL)    MCH 21.8 (*) 26.0 - 34.0 (pg)    MCHC 30.5  30.0 - 36.0 (g/dL)    RDW 04.5  40.9 - 81.1 (%)    Platelets 196  150 - 400 (K/uL)   DIFFERENTIAL     Status: Abnormal   Collection Time   08/01/11 11:26 PM      Component Value Range Comment   Neutrophils Relative 42 (*) 43 - 77 (%)    Lymphocytes Relative 48 (*) 12 - 46 (%)    Monocytes Relative 8  3 - 12 (%)    Eosinophils Relative 2  0 - 5 (%)    Basophils Relative 0  0 - 1 (%)    Neutro Abs 2.9  1.7 - 7.7 (K/uL)    Lymphs Abs 3.4  0.7 - 4.0 (K/uL)    Monocytes Absolute 0.6  0.1 - 1.0 (K/uL)    Eosinophils Absolute 0.1  0.0 - 0.7 (K/uL)    Basophils Absolute 0.0  0.0 - 0.1 (K/uL)    Smear Review MORPHOLOGY UNREMARKABLE     BASIC METABOLIC PANEL     Status: Abnormal   Collection Time   08/01/11 11:26 PM      Component Value Range Comment   Sodium 140  135 - 145 (mEq/L)    Potassium 3.9  3.5 - 5.1 (mEq/L)    Chloride 102  96 - 112 (mEq/L)    CO2 26  19 - 32 (mEq/L)    Glucose, Bld 90  70 - 99 (mg/dL)    BUN 22  6 - 23 (mg/dL)    Creatinine, Ser 9.14 (*) 0.50 - 1.10 (mg/dL)    Calcium 9.8  8.4 - 10.5 (mg/dL)    GFR calc non Af Amer 36 (*) >90 (mL/min)    GFR calc Af Amer 42 (*) >90 (mL/min)   TROPONIN I     Status: Normal   Collection Time   08/01/11 11:26 PM      Component Value Range Comment   Troponin I <0.30  <0.30 (ng/mL)   URINALYSIS, ROUTINE W REFLEX MICROSCOPIC     Status: Abnormal   Collection Time   08/01/11 11:54 PM      Component  Value Range Comment   Color, Urine AMBER (*) YELLOW  BIOCHEMICALS MAY BE AFFECTED BY COLOR   APPearance CLEAR  CLEAR     Specific Gravity, Urine 1.025  1.005 - 1.030     pH 6.0  5.0 - 8.0     Glucose, UA NEGATIVE  NEGATIVE (mg/dL)    Hgb urine dipstick TRACE (*) NEGATIVE     Bilirubin Urine NEGATIVE  NEGATIVE  Ketones, ur NEGATIVE  NEGATIVE (mg/dL)    Protein, ur NEGATIVE  NEGATIVE (mg/dL)    Urobilinogen, UA 0.2  0.0 - 1.0 (mg/dL)    Nitrite POSITIVE (*) NEGATIVE     Leukocytes, UA TRACE (*) NEGATIVE    URINE MICROSCOPIC-ADD ON     Status: Abnormal   Collection Time   08/01/11 11:54 PM      Component Value Range Comment   Squamous Epithelial / LPF MANY (*) RARE     WBC, UA 3-6  <3 (WBC/hpf)    RBC / HPF 3-6  <3 (RBC/hpf)    Bacteria, UA MANY (*) RARE     Casts HYALINE CASTS (*) NEGATIVE     Ct Head Wo Contrast  08/02/2011  *RADIOLOGY REPORT*  Clinical Data: Acute mental status changes with lethargy.  History of hypertension, migraines, stroke, and seizures.  CT HEAD WITHOUT CONTRAST  Technique:  Contiguous axial images were obtained from the base of the skull through the vertex without contrast.  Comparison: Unenhanced cranial CT 12/12/2010, 05/16/2010, 03/30/2010, 10/31/2009 , 05/29/2009, 08/03/2008, 07/05/2008.  Findings: Ventricular system normal in size and appearance for age. Midline mass in the frontal region just above the cribiform plate with associated calcifications, as noted previously.  No mass lesions elsewhere.  No midline shift.  No acute hemorrhage or hematoma.  No extra-axial fluid collections.  Geographic low attenuation in the right frontal lobe.  No focal brain parenchymal abnormalities elsewhere.  Mild changes of hyperostosis frontalis interna. Visualized paranasal sinuses, mastoid air cells, and middle ear cavities well- aerated.  Bilateral carotid siphon and left vertebral artery atherosclerosis.  IMPRESSION:  1.  Possible subacute non-hemorrhagic right frontal lobe  stroke, new since the examination 3 months ago. 2.  Stable partially calcified meningioma arising in the midline frontal region from the cribiform plate.  Original Report Authenticated By: Arnell Sieving, M.D.   Dg Chest Port 1 View  08/02/2011  *RADIOLOGY REPORT*  Clinical Data: Acute mental status changes.  History of diabetes and hypertension.  Former smoker.  PORTABLE CHEST - 1 VIEW 08/01/2011 23:22 hours:  Comparison: Portable chest x-ray 04/27/2011, 03/30/2010.  Two-view chest x-ray 08/23/2010.  Findings: Suboptimal inspiration due to body habitus which accounts for atelectasis at the bases and accentuates the cardiac silhouette.  Taking this into account, cardiac silhouette upper normal in size, unchanged.  Lungs otherwise clear.  IMPRESSION: Suboptimal inspiration accounts for bibasilar atelectasis.  No acute cardiopulmonary disease otherwise.  Original Report Authenticated By: Arnell Sieving, M.D.      Assessment Present on Admission:   .Lethargy/ toxic metabolic encephalopathy - probably related to polypharmacy and dehydration; possible also aggravated by urinary tract infection .Acute renal failure -probably due to dehydration  .DIABETES MELLITUS- apparently improved control in the setting of acute renal failure  .HYPERLIPIDEMIA  .OBESITY .HYPERTENSION .SLEEP APNEA .Bipolar disorder    PLAN:  admit this lady for hydration, and observation to allow her medications wear off. O. all nephrotoxic medications such as diuretics, metformin   Will hold most of her psychotropic medications, but will maintain her important antipsychotics, chart not due for another 18 hours, by then she should be significantly hydrated.  Will hold antidiabetic medications while in renal failure and administer sliding scale  Repeat urinalysis and start empiric ceftriaxone pending cultures     Other plans as per orders.   Vernona Peake 08/02/2011, 5:10 AM

## 2011-08-02 NOTE — Progress Notes (Signed)
Spoke with Dr. Kerry Hough this morning about the foley. Reported to Dr. Kerry Hough that patient is awake and alert and oriented and is using the bathroom. Dr. Kerry Hough stated we don't have to insert the foley.

## 2011-08-02 NOTE — Progress Notes (Signed)
Patient admitted by Dr. Orvan Falconer earlier today  Patient seen and examined, database reviewed  She appears more coherent and awake today  She is on IV fluids for acute renal failure and dehydration  Urinalysis indicates possible infection, started cipro  If she continues to improve then possibly discharge home tomorrow.

## 2011-08-02 NOTE — ED Notes (Addendum)
Pt continues to be very lethargic. Respirations regular, even and unlabored.  Arrousable to verbal and physical stimuli.  Pt is aware of location when asked.

## 2011-08-02 NOTE — ED Notes (Signed)
Pt slightly more arousable to verbal stimuli, although still slightly lethargic.  Speech remains somewhat slurred, but pt appears more oriented to situation.[name]

## 2011-08-02 NOTE — Progress Notes (Signed)
Attempted to call pt's son, [name], at [phone] but call would not go through, rang fast as if possibly long distance.  Attempted to call pt's sister-in-law, [name], at [phone]; number disconnected.  Reached [name] at [phone] and told her pt was admitted to hospital and that she had hit her life alert button after falling at home and EMS had brought her to hospital.  She did not have another number to reach pt's son but stated that she would go by his home today and let him know.  Gave her pt's room number.  Told her if pt more alert today she may be discharged home.

## 2011-08-02 NOTE — ED Notes (Signed)
Dr Campbell at bedside.

## 2011-08-02 NOTE — ED Notes (Signed)
Pt continues to rest with eyes closed.  Responsive to verbal and physical stimuli, but remains lethargic.  Respirations regular, even and unlabored. No distress noted.

## 2011-08-02 NOTE — ED Notes (Signed)
Attempted to call report to floor.  Nurse unavailable.  Left message for return call.

## 2011-08-02 NOTE — Progress Notes (Signed)
Orthostatic vital signs ordered but unable to complete at this time d/t pt lethargy.  Will attempt when pt more alert.

## 2011-08-02 NOTE — Care Management Note (Signed)
    [place] 1 of 1   08/03/2011     3:56:49 PM   CARE MANAGEMENT NOTE 08/03/2011  Patient:  [name], [name]   Account Number:  [id number]  Date Initiated:  08/02/2011  Documentation initiated by:  Rosemary Holms  Subjective/Objective Assessment:   Pt admitted with AMS/UTI. Pt states she has IT consultant that comes once a week to assist with medications and [facility] aide qd, 7days a week.     Action/Plan:   Eager to go home. will resume HH and aide.   Anticipated DC Date:  08/03/2011   Anticipated DC Plan:  HOME W HOME HEALTH SERVICES      DC Planning Services  CM consult      Choice offered to / List presented to:             Status of service:  Completed, signed off Medicare Important Message given?   (If response is "NO", the following Medicare IM given date fields will be blank) Date Medicare IM given:   Date Additional Medicare IM given:    Discharge Disposition:  HOME W HOME HEALTH SERVICES  Per UR Regulation:    If discussed at Long Length of Stay Meetings, dates discussed:    Comments:  08/02/11 1100 Marsia Cino Leanord Hawking RN BSN CM

## 2011-08-03 ENCOUNTER — Encounter (HOSPITAL_COMMUNITY): Payer: Self-pay | Admitting: Internal Medicine

## 2011-08-03 ENCOUNTER — Encounter: Payer: Self-pay | Admitting: Urgent Care

## 2011-08-03 DIAGNOSIS — F3130 Bipolar disorder, current episode depressed, mild or moderate severity, unspecified: Secondary | ICD-10-CM

## 2011-08-03 DIAGNOSIS — G9341 Metabolic encephalopathy: Secondary | ICD-10-CM

## 2011-08-03 DIAGNOSIS — E119 Type 2 diabetes mellitus without complications: Secondary | ICD-10-CM

## 2011-08-03 DIAGNOSIS — N179 Acute kidney failure, unspecified: Secondary | ICD-10-CM

## 2011-08-03 HISTORY — DX: Metabolic encephalopathy: G93.41

## 2011-08-03 LAB — GLUCOSE, CAPILLARY
Glucose-Capillary: 165 mg/dL — ABNORMAL HIGH (ref 70–99)
Glucose-Capillary: 135 mg/dL — ABNORMAL HIGH (ref 70–99)

## 2011-08-03 LAB — CBC
HCT: 31 % — ABNORMAL LOW (ref 36.0–46.0)
MCV: 70.6 fL — ABNORMAL LOW (ref 78.0–100.0)
Platelets: 174 10*3/uL (ref 150–400)
RBC: 4.39 MIL/uL (ref 3.87–5.11)
RDW: 14.4 % (ref 11.5–15.5)
WBC: 6.4 10*3/uL (ref 4.0–10.5)

## 2011-08-03 LAB — BASIC METABOLIC PANEL
CO2: 27 mEq/L (ref 19–32)
CO2: 27 mEq/L (ref 19–32)
Calcium: 9.4 mg/dL (ref 8.4–10.5)
Chloride: 102 mEq/L (ref 96–112)
Chloride: 104 mEq/L (ref 96–112)
Creatinine, Ser: 0.72 mg/dL (ref 0.50–1.10)
GFR calc Af Amer: 88 mL/min — ABNORMAL LOW (ref >90)
GFR calc Af Amer: >90 mL/min (ref >90)
Potassium: 4.5 mEq/L (ref 3.5–5.1)
Sodium: 136 mEq/L (ref 135–145)

## 2011-08-03 MED ORDER — CIPROFLOXACIN HCL 500 MG PO TABS: 500.0000 mg | ORAL_TABLET | Freq: Two times a day (BID) | ORAL | Status: AC

## 2011-08-03 MED ORDER — ENOXAPARIN SODIUM 40 MG/0.4ML ~~LOC~~ SOLN: 40.0000 mg | SUBCUTANEOUS | Status: DC

## 2011-08-03 MED ORDER — CIPROFLOXACIN HCL 250 MG PO TABS: 500.0000 mg | ORAL_TABLET | Freq: Two times a day (BID) | ORAL | Status: DC

## 2011-08-03 NOTE — Assessment & Plan Note (Signed)
Persistent dysphagia s/p Maloney dilation 12/29/2010.  Abnormal GES 11/23/11. 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  BPE for further evaluation You need to resume your insulin as soon as possible Continue AcipHex 20 mg daily

## 2011-08-03 NOTE — Discharge Summary (Signed)
Physician Discharge Summary  Patient ID: [name] MRN: [id number] DOB/AGE: Nov 09, 1950 61 y.o.  Admit date: 08/01/2011 Discharge date: 08/03/2011  Primary Care Physician:  Syliva Overman, MD, MD   Discharge Diagnoses:    Principal Problem:  *Metabolic encephalopathy Active Problems:  DIABETES MELLITUS  HYPERLIPIDEMIA  OBESITY  HYPERTENSION  SLEEP APNEA  Polypharmacy  Bipolar disorder  Acute renal failure  Hemiplegia affecting non-dominant side, post-stroke Dehydration Urinary Tract Infection, present on admission  Medication List  As of 08/03/2011  3:13 PM   STOP taking these medications         cyclobenzaprine 10 MG tablet      insulin glargine 100 UNIT/ML injection      triamcinolone cream 0.1 %         TAKE these medications         ANTIVERT 12.5 MG tablet   Generic drug: meclizine   TAKE 1 TABLET BY MOUTH 3 TIMES DAILY AS NEEDED FOR DIZZINESS.      B-D UF III MINI PEN NEEDLES 31G X 5 MM Misc   Generic drug: Insulin Pen Needle   USE AS DIRECTED.      ciprofloxacin 500 MG tablet   Commonly known as: CIPRO   Take 1 tablet (500 mg total) by mouth 2 (two) times daily.      cloNIDine 0.3 MG tablet   Commonly known as: CATAPRES   TAKE 1 TABLET BY MOUTH 3 TIMES DAILY: TAKE AT 8 A.M.;4 P.M.; AND MIDNIGHT.      CRESTOR 10 MG tablet   Generic drug: rosuvastatin   TAKE 1 TABLET BY MOUTH AT BEDTIME.      cycloSPORINE 0.05 % ophthalmic emulsion   Commonly known as: RESTASIS   1 drop 2 (two) times daily.      diphenoxylate-atropine 2.5-0.025 MG per tablet   Commonly known as: LOMOTIL   Take 1 tablet by mouth 4 (four) times daily as needed. For loose stools      ergocalciferol 50000 UNITS capsule   Commonly known as: VITAMIN D2   Take 50,000 Units by mouth once a week. Patient takes on Sundays      fluticasone 50 MCG/ACT nasal spray   Commonly known as: FLONASE   Place 1 spray into the nose daily. Allergies      glipiZIDE 10 MG tablet   Commonly  known as: GLUCOTROL   Take 10 mg by mouth at bedtime.      hydrochlorothiazide 12.5 MG capsule   Commonly known as: MICROZIDE   Take 12.5 mg by mouth daily.      insulin detemir 100 UNIT/ML injection   Commonly known as: LEVEMIR   Inject 30 Units into the skin at bedtime.      lamoTRIgine 100 MG tablet   Commonly known as: LAMICTAL   Take 100 mg by mouth every morning.      LEVOXYL 50 MCG tablet   Generic drug: levothyroxine   Take 50 mcg by mouth every morning.      LORazepam 1 MG tablet   Commonly known as: ATIVAN   Take 0.5 tablets (0.5 mg total) by mouth 3 (three) times daily.      metFORMIN 1000 MG tablet   Commonly known as: GLUCOPHAGE   Take 1 tablet (1,000 mg total) by mouth 2 (two) times daily with a meal.      metoprolol 50 MG tablet   Commonly known as: LOPRESSOR   Take 50 mg by mouth 2 (two) times daily.  mirtazapine 30 MG tablet   Commonly known as: REMERON   Take 30 mg by mouth at bedtime.      nystatin 100000 UNIT/GM Powd   APPLY TO AFFECTED AREAS TWICE DAILY.      ondansetron 4 MG tablet   Commonly known as: ZOFRAN   Take 1 tablet (4 mg total) by mouth every 8 (eight) hours as needed for nausea.      ONGLYZA 5 MG Tabs tablet   Generic drug: saxagliptin HCl   TAKE 1 TABLET BY MOUTH ONCE DAILY.      oxyCODONE-acetaminophen 10-325 MG per tablet   Commonly known as: PERCOCET   Take 1 tablet by mouth 3 (three) times daily as needed. Pain      PLAVIX 75 MG tablet   Generic drug: clopidogrel   TAKE ONE TABLET BY MOUTH ONCE DAILY.      polyethylene glycol packet   Commonly known as: MIRALAX / GLYCOLAX   Take 17 g by mouth 2 (two) times daily.      pregabalin 75 MG capsule   Commonly known as: LYRICA   Take 75 mg by mouth at bedtime.      promethazine 12.5 MG tablet   Commonly known as: PHENERGAN   Take 12.5 mg by mouth every 6 (six) hours as needed. For nausea      RABEprazole 20 MG tablet   Commonly known as: ACIPHEX   Take 1 tablet (20  mg total) by mouth daily.      sertraline 100 MG tablet   Commonly known as: ZOLOFT   Take 150 mg by mouth at bedtime.      SINGULAIR 10 MG tablet   Generic drug: montelukast      thiothixene 2 MG capsule   Commonly known as: NAVANE   Take 2 mg by mouth at bedtime.      tiZANidine 4 MG tablet   Commonly known as: ZANAFLEX   Take 4 mg by mouth 3 (three) times daily as needed. For spasms      traZODone 150 MG tablet   Commonly known as: DESYREL      VOLTAREN 1 % Gel   Generic drug: diclofenac sodium   APPLY TWICE DAILY TO AFFECTED AREA(S) AS NEEDED FOR PAIN.           Discharge Exam Blood pressure 157/75, pulse 61, temperature 98.5 F (36.9 C), temperature source Oral, resp. rate 20, height 5\' 3"  (1.6 m), weight 70.3 kg (154 lb 15.7 oz), SpO2 97.00%. NAD, AAOx3 CTA B S1, S2, RRR Soft, NT, BS+ No edema b/l  Disposition and Follow-up:  Follow up with primary doctor in 2 weeks  Consults:  none   Significant Diagnostic Studies:  Ct Head Wo Contrast  08/02/2011  *RADIOLOGY REPORT*  Clinical Data: Acute mental status changes with lethargy.  History of hypertension, migraines, stroke, and seizures.  CT HEAD WITHOUT CONTRAST  Technique:  Contiguous axial images were obtained from the base of the skull through the vertex without contrast.  Comparison: Unenhanced cranial CT 12/12/2010, 05/16/2010, 03/30/2010, 10/31/2009 , 05/29/2009, 08/03/2008, 07/05/2008.  Findings: Ventricular system normal in size and appearance for age. Midline mass in the frontal region just above the cribiform plate with associated calcifications, as noted previously.  No mass lesions elsewhere.  No midline shift.  No acute hemorrhage or hematoma.  No extra-axial fluid collections.  Geographic low attenuation in the right frontal lobe.  No focal brain parenchymal abnormalities elsewhere.  Mild changes of hyperostosis frontalis  interna. Visualized paranasal sinuses, mastoid air cells, and middle ear cavities  well- aerated.  Bilateral carotid siphon and left vertebral artery atherosclerosis.  IMPRESSION:  1.  Possible subacute non-hemorrhagic right frontal lobe stroke, new since the examination 3 months ago. 2.  Stable partially calcified meningioma arising in the midline frontal region from the cribiform plate.  Original Report Authenticated By: Arnell Sieving, M.D.   Dg Chest Port 1 View  08/02/2011  *RADIOLOGY REPORT*  Clinical Data: Acute mental status changes.  History of diabetes and hypertension.  Former smoker.  PORTABLE CHEST - 1 VIEW 08/01/2011 23:22 hours:  Comparison: Portable chest x-ray 04/27/2011, 03/30/2010.  Two-view chest x-ray 08/23/2010.  Findings: Suboptimal inspiration due to body habitus which accounts for atelectasis at the bases and accentuates the cardiac silhouette.  Taking this into account, cardiac silhouette upper normal in size, unchanged.  Lungs otherwise clear.  IMPRESSION: Suboptimal inspiration accounts for bibasilar atelectasis.  No acute cardiopulmonary disease otherwise.  Original Report Authenticated By: Arnell Sieving, M.D.    Brief H and P: For complete details please refer to admission H and P, but in brief [name] is an 61 y.o. female. Middle-aged [place] American lady who lives alone and has multiple medical problems, including diabetes, hypertension, chronic pain,obstructive sleep apnea, bipolar disorder and she is on multiple psychotropic medications. She also has a left hemiplegia status post remote stroke, and is mobile usually by wheelchair.  Patient reportedly pressed her life alert button last evening after she fell into a chair and EMS was sent to check on her. Found her in a stable condition and she declined transportation to the hospital. Later in the night life alert service call back to check on her and assess her speech has been slurred and unintelligible and EMS was sent back to re-evaluate. They found her lethargic though alert enough to tell  them that she had taken all her night medication. She was brought to the emergency room and after many hours remains lethargic though arousable. She has a CT scan of questionable abnormality and the hospitalist service was called to assist with management.  urinalysis is is contaminated and unable to confirm possible urinary tract infection  Her creatinine is double what it was one month ago.    Hospital Course:  Patient was admitted to the hospital with alteration in mental status and lethargy.  She was found to be dehydrated, had a decline in her renal function and was also on numerous medications that may have contributed to her symptoms. She was also found to have a possible UTI.  She was hydrated with IV fluids which resulted in an improvement in her renal function.  She was started empirically on ciprofloxacin.  Urine culture results are currently pending. She is afebrile and her mental status has returned to baseline.  She has been advised to keep herself hydrated.  She will be discharged home today.  Time spent on Discharge:  Signed: Takeesha Isley [facility] Pager: [phone] 08/03/2011, 3:13 PM

## 2011-08-03 NOTE — Telephone Encounter (Signed)
Pt is currently in the hospital. Called and left message.

## 2011-08-03 NOTE — Assessment & Plan Note (Addendum)
Most likely secondary to constipation and urinary tract infection.  Seen in ED & antibiotics given. Continue MiraLax 17 g daily as needed for constipation Call if left-sided abdominal pain becomes worse

## 2011-08-03 NOTE — Clinical Social Work Note (Signed)
CSW received referral as pt lives alone and was lethargic upon admission.  CM aware.  Pt evaluated by PT and no follow up recommended.  Plan is to return home.  CSW will sign off unless further needs arise.  Derenda Fennel, [place] [phone]

## 2011-08-03 NOTE — Discharge Instructions (Signed)
Altered Mental Status Altered mental status most often refers to an abnormal change in your responsiveness and awareness. It can affect your speech, thought, mobility, memory, attention span, or alertness. It can range from slight confusion to complete unresponsiveness (coma). Altered mental status can be a sign of a serious underlying medical condition. Rapid evaluation and medical treatment is necessary for patients having an altered mental status. CAUSES   Low blood sugar (hypoglycemia) or diabetes.   Severe loss of body fluids (dehydration) or a body salt (electrolyte) imbalance.   A stroke or other neurologic problem, such as dementia or delirium.   A head injury or tumor.   A drug or alcohol overdose.   Exposure to toxins or poisons.   Depression, anxiety, and stress.   A low oxygen level (hypoxia).   An infection.   Blood loss.   Twitching or shaking (seizure).   Heart problems, such as heart attack or heart rhythm problems (arrhythmias).   A body temperature that is too low or too high (hypothermia or hyperthermia).  DIAGNOSIS  A diagnosis is based on your history, symptoms, physical and neurologic examinations, and diagnostic tests. Diagnostic tests may include:  Measurement of your blood pressure, pulse, breathing, and oxygen levels (vital signs).   Blood tests.   Urine tests.   X-ray exams.   A computerized magnetic scan (magnetic resonance imaging, MRI).   A computerized X-ray scan (computed tomography, CT scan).  TREATMENT  Treatment will depend on the cause. Treatment may include:  Management of an underlying medical or mental health condition.   Critical care or support in the hospital.  HOME CARE INSTRUCTIONS   Only take over-the-counter or prescription medicines for pain, discomfort, or fever as directed by your caregiver.   Manage underlying conditions as directed by your caregiver.   Eat a healthy, well-balanced diet to maintain strength.    Join a support group or prevention program to cope with the condition or trauma that caused the altered mental status. Ask your caregiver to help choose a program that works for you.   Follow up with your caregiver for further examination, therapy, or testing as directed.  SEEK MEDICAL CARE IF:   You feel unwell or have chills.   You or your family notice a change in your behavior or your alertness.   You have trouble following your caregiver's treatment plan.   You have questions or concerns.  SEEK IMMEDIATE MEDICAL CARE IF:   You have a rapid heartbeat or have chest pain.   You have difficulty breathing.   You have a fever.   You have a headache with a stiff neck.   You cough up blood.   You have blood in your urine or stool.   You have severe agitation or confusion.  MAKE SURE YOU:   Understand these instructions.   Will watch your condition.   Will get help right away if you are not doing well or get worse.  Document Released: 08/17/2009 Document Revised: 02/16/2011 Document Reviewed: 08/17/2009 Exit[employer] Patient Information 2012 [employer]. 

## 2011-08-03 NOTE — Assessment & Plan Note (Signed)
See "abdominal pain"

## 2011-08-03 NOTE — Progress Notes (Signed)
PHARMACIST - PHYSICIAN COMMUNICATION DR:   Kerry Hough CONCERNING: Antibiotic IV to Oral Route Change Policy  RECOMMENDATION: This patient is receiving Cipro by the intravenous route.  Based on criteria approved by the Pharmacy and Therapeutics Committee, the antibiotic(s) is/are being converted to the equivalent oral dose form(s).   DESCRIPTION: These criteria include:  Patient being treated for a respiratory tract infection, urinary tract infection, or cellulitis  The patient is not neutropenic and does not exhibit a GI malabsorption state  The patient is eating (either orally or via tube) and/or has been taking other orally administered medications for a least 24 hours  The patient is improving clinically and has a Tmax < 100.5  If you have questions about this conversion, please contact the Pharmacy Department  [x]   [phone] )  [facility] []   [phone] )  [facility]  []   [phone] )  [facility] []   [phone] )  [facility]    Emily Filbert [place], [place] 08/03/2011@12 :40 PM

## 2011-08-03 NOTE — Progress Notes (Signed)
Discharge instructions reviewed with patient, patient voiced understanding. Discharge instructions and prescription given to patient. Patient asked to have prescription [facility] to [facility]. Faxed to [facility] and verified they received and [facility] stated it is being filled and will be delivered to patient around [phone]. Patient in stable condition and waiting for her ride. Will continue to monitor.

## 2011-08-03 NOTE — Progress Notes (Signed)
Patient in stable condition and transported out by tech.  

## 2011-08-03 NOTE — Assessment & Plan Note (Addendum)
Continue Aciphex 20 mg daily

## 2011-08-04 LAB — URINE CULTURE

## 2011-08-04 NOTE — Progress Notes (Signed)
Discharge summary sent to payer through [facility]  

## 2011-08-04 NOTE — Progress Notes (Signed)
Faxed to PCP

## 2011-08-08 DIAGNOSIS — G8929 Other chronic pain: Secondary | ICD-10-CM

## 2011-08-08 DIAGNOSIS — R262 Difficulty in walking, not elsewhere classified: Secondary | ICD-10-CM

## 2011-08-08 DIAGNOSIS — E119 Type 2 diabetes mellitus without complications: Secondary | ICD-10-CM

## 2011-08-08 DIAGNOSIS — R279 Unspecified lack of coordination: Secondary | ICD-10-CM

## 2011-08-09 ENCOUNTER — Ambulatory Visit (HOSPITAL_COMMUNITY)
Admission: RE | Admit: 2011-08-09 | Discharge: 2011-08-09 | Disposition: A | Discharge status: Home / Self Care | Payer: Medicare Other | Source: Ambulatory Visit | Attending: Urgent Care | Admitting: Urgent Care

## 2011-08-09 ENCOUNTER — Telehealth: Payer: Self-pay | Admitting: Family Medicine

## 2011-08-09 DIAGNOSIS — R1310 Dysphagia, unspecified: Secondary | ICD-10-CM | POA: Insufficient documentation

## 2011-08-09 LAB — BASIC METABOLIC PANEL
BUN: 18 mg/dL (ref 6–23)
Chloride: 106 mEq/L (ref 96–112)
Glucose, Bld: 79 mg/dL (ref 70–99)
Potassium: 3.8 mEq/L (ref 3.5–5.3)

## 2011-08-09 NOTE — Progress Notes (Signed)
Quick Note:  Discussed results w/ pt. Pt advised to chew food thoroughly Cc: [name] Overman, MD  ______

## 2011-08-09 NOTE — Progress Notes (Signed)
Forwarded to PCP.

## 2011-08-11 NOTE — Telephone Encounter (Signed)
Will use the one I gave her until she comes in for appt. Was just needing lancet device

## 2011-08-17 ENCOUNTER — Ambulatory Visit (INDEPENDENT_AMBULATORY_CARE_PROVIDER_SITE_OTHER): Payer: Medicare Other | Admitting: Family Medicine

## 2011-08-17 ENCOUNTER — Other Ambulatory Visit: Payer: Self-pay

## 2011-08-17 ENCOUNTER — Encounter: Payer: Self-pay | Admitting: Family Medicine

## 2011-08-17 VITALS — BP 140/70 | HR 76 | Resp 16 | Ht 62.0 in | Wt 154.0 lb

## 2011-08-17 DIAGNOSIS — I10 Essential (primary) hypertension: Secondary | ICD-10-CM

## 2011-08-17 DIAGNOSIS — N390 Urinary tract infection, site not specified: Secondary | ICD-10-CM

## 2011-08-17 DIAGNOSIS — E785 Hyperlipidemia, unspecified: Secondary | ICD-10-CM

## 2011-08-17 DIAGNOSIS — F319 Bipolar disorder, unspecified: Secondary | ICD-10-CM

## 2011-08-17 DIAGNOSIS — K219 Gastro-esophageal reflux disease without esophagitis: Secondary | ICD-10-CM

## 2011-08-17 DIAGNOSIS — E119 Type 2 diabetes mellitus without complications: Secondary | ICD-10-CM

## 2011-08-17 DIAGNOSIS — E669 Obesity, unspecified: Secondary | ICD-10-CM

## 2011-08-17 MED ORDER — ACCU-CHEK MULTICLIX LANCETS MISC: Status: DC

## 2011-08-17 MED ORDER — GLIPIZIDE 10 MG PO TABS: 10.0000 mg | ORAL_TABLET | Freq: Every day | ORAL | Status: DC

## 2011-08-17 NOTE — Patient Instructions (Addendum)
f/u Mid September.Please call if you need me before  Please cancel sooner appointment  No more insulin at this time.  Glucotrol is ONCE daily at breakfast. Continue onglyza ONCE daily Continue metformin TWICE daily.  Blood sugar testing is once daily only. Goal for fasting blood sugar ranges from 80 to 120 and 2 hours after any meal or at bedtime should be between 130 to 170.  Fasting lipid, cmp and EGFR and hBa1C in September before visit.   You will be referred to neurologist of your choice when you call and let us know  Have the pharmacy /mail order that you want to start using contact us for scripts when you are ready   Once daily testing supplies needed for your [employer] meter will be sent in   Urinalysis in office today is totally normal despite the fact you report frequency

## 2011-08-17 NOTE — Progress Notes (Signed)
  Subjective:    Patient ID: [name], female    DOB: [birth year], 61 y.o.   MRN: [id number]  HPI The PT is here for follow up and re-evaluation of chronic medical conditions, medication management and review of any available recent lab and radiology data.  Preventive health is updated, specifically  Cancer screening and Immunization.   Questions or concerns regarding consultations or procedures which the PT has had in the interim are  addressed. The PT denies any adverse reactions to current medications since the last visit.  Reports uncontrolled lower extremity pain she is following both with her back surgeon and states she was referred to another specialist recently. States she continues to experience unsteadiness, wants a new neurologist , will call when she identifies who she wants, Tests blood sugars daily, fasting sugars are seldom over 130, denies polyuria, polydipsia or hypoglycemic episodes  Review of Systems See HPI Denies recent fever or chills. Denies sinus pressure, nasal congestion, ear pain or sore throat. Denies chest congestion, productive cough or wheezing. Denies chest pains, palpitations and leg swelling Denies abdominal pain, nausea, vomiting,diarrhea or constipation.   Denies dysuria, frequency, hesitancy or incontinence. . Denies headaches, seizures, numbness, or tingling. Denies uncontrolled  depression, anxiety or insomnia. Denies skin break down or rash.        Objective:   Physical Exam  Patient alert and oriented and in no cardiopulmonary distress.  HEENT: No facial asymmetry, EOMI, no sinus tenderness,  oropharynx pink and moist.  Neck supple no adenopathy.  Chest: Clear to auscultation bilaterally.  CVS: S1, S2 no murmurs, no S3.  ABD: Soft non tender. Bowel sounds normal.  Ext: No edema  MS: Adequate though reduced  ROM spine, shoulders, hips and knees.  Skin: Intact, no ulcerations or rash noted.  Psych: Good eye contact, normal  affect. Memory intact not anxious or depressed appearing.  CNS: CN 2-12 intact, power, tone and sensation normal throughout.       Assessment & Plan:

## 2011-08-18 LAB — POCT URINALYSIS DIPSTICK
Blood, UA: NEGATIVE
Glucose, UA: NEGATIVE
Ketones, UA: NEGATIVE
Spec Grav, UA: 1.025

## 2011-08-19 NOTE — Assessment & Plan Note (Signed)
Controlled, no change in medication Followed by gI 

## 2011-08-19 NOTE — Assessment & Plan Note (Signed)
Controlled, no change in medication  

## 2011-08-19 NOTE — Assessment & Plan Note (Signed)
Stable and followed by mental health 

## 2011-08-19 NOTE — Assessment & Plan Note (Signed)
Hyperlipidemia:Low fat diet discussed and encouraged.  Controlled, no change in medication   

## 2011-08-19 NOTE — Assessment & Plan Note (Signed)
Sub optimal control no med change

## 2011-08-21 ENCOUNTER — Telehealth: Payer: Self-pay | Admitting: Family Medicine

## 2011-08-21 IMAGING — MR MR LUMBAR SPINE W/O CM
4 of 5 series · 23 of 48 positions shown · non-contrast
Comparison: 12/14/2008.

CLINICAL DATA: 59-year-old female low back pain radiating to both
legs.

MRI LUMBAR SPINE WITHOUT CONTRAST
TECHNIQUE: Multiplanar and multiecho pulse sequences of the lumbar
spine were obtained without intravenous contrast.

[Series 3: T2 · sagittal · 4.0mm · 0.76mm/px · 5 of 14 slices shown (1 of 2)]
[im 1/14]
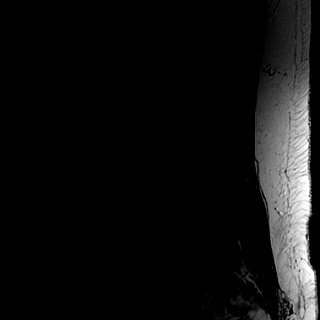
[im 4/14]
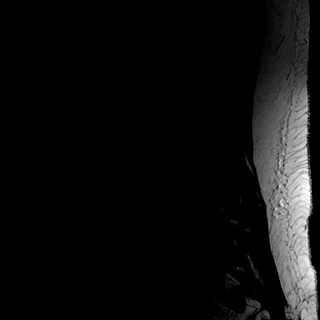
[im 7/14]
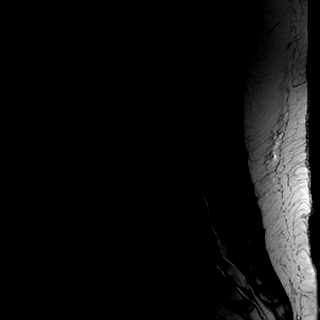
[im 10/14]
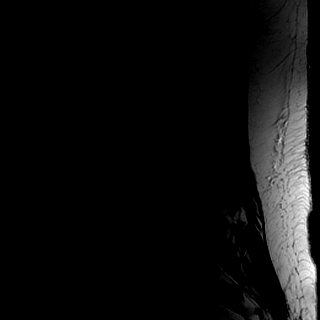
[im 14/14]
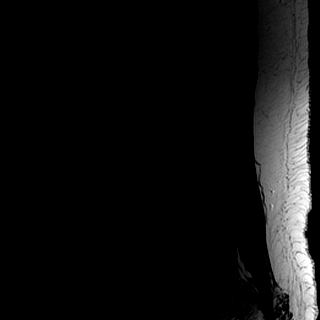

[Series 4: T1 · sagittal · 4.0mm · 0.38mm/px · 5 of 14 slices shown (1 of 2)]
[im 1/14]
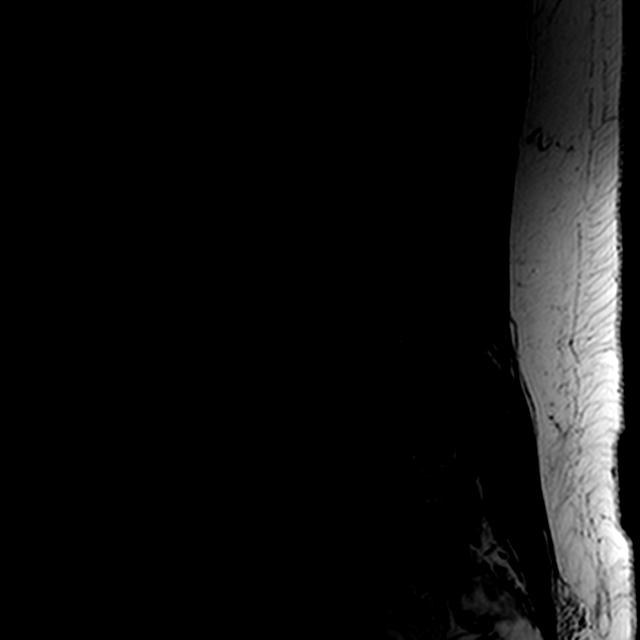
[im 4/14]
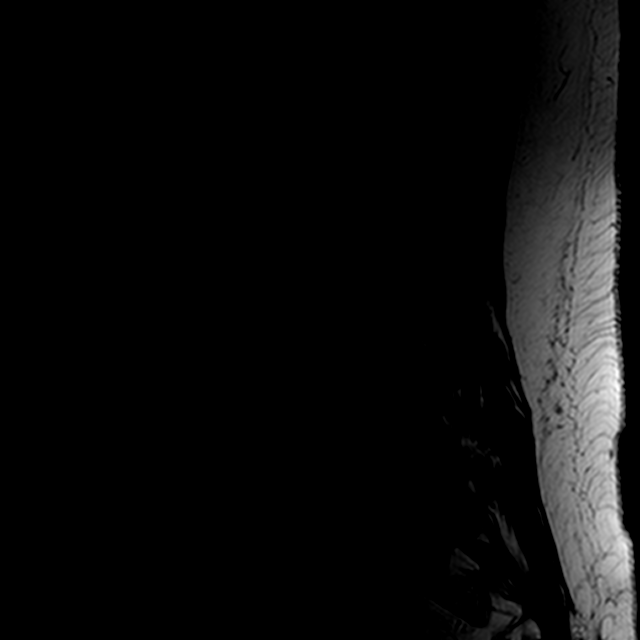
[im 7/14]
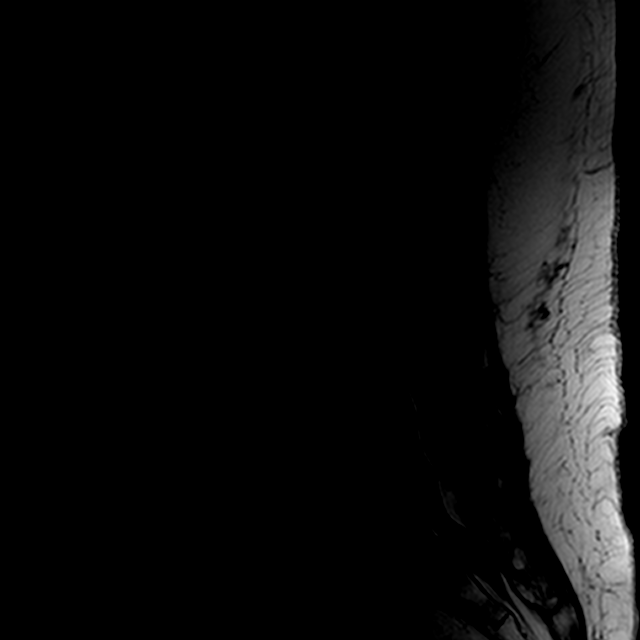
[im 10/14]
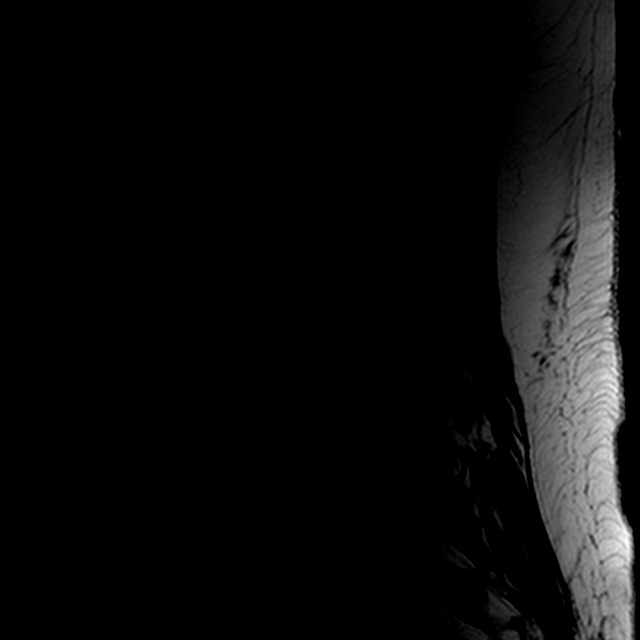
[im 14/14]
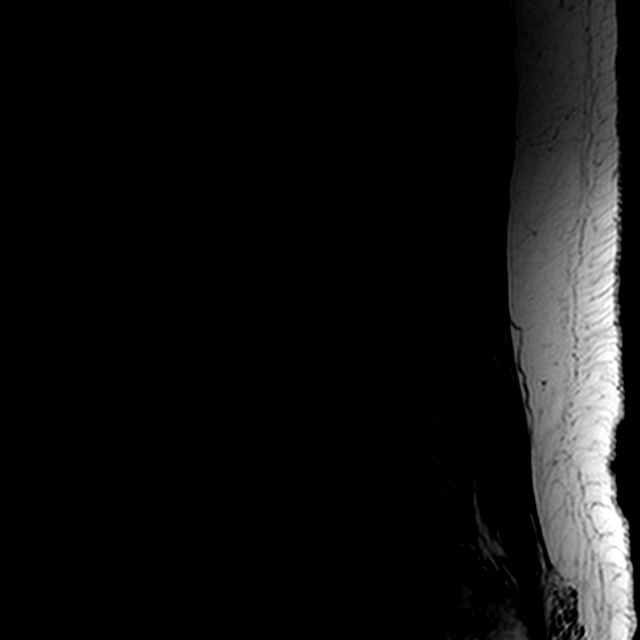

[Series 6: T2 · axial · 4.0mm · 0.46mm/px · z∈[-90,+88]mm · 10 of 39 slices shown (2 of 2)]
[im 3/39]
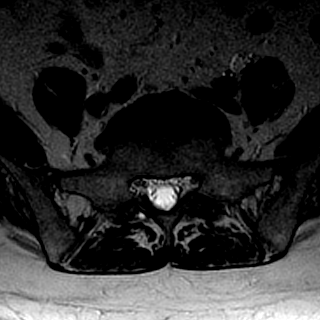
[im 6/39]
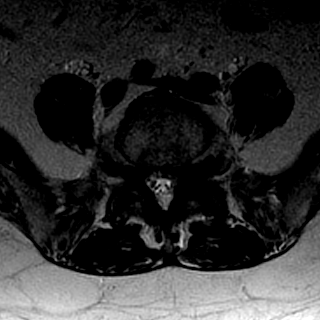
[im 8/39]
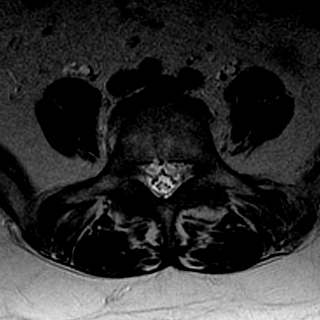
[im 13/39]
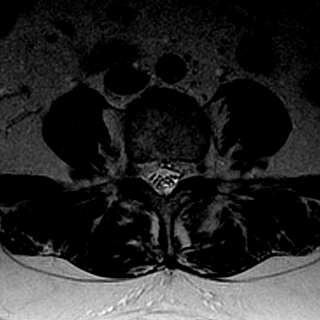
[im 18/39]
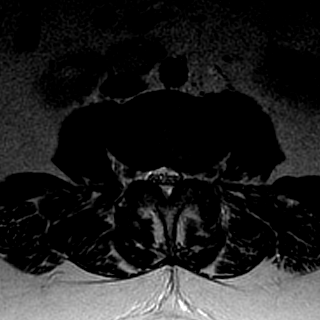
[im 21/39]
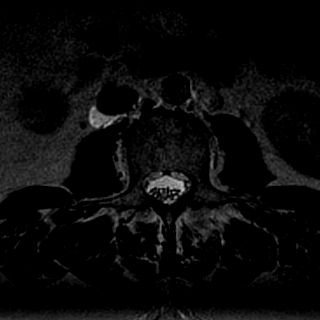
[im 23/39]
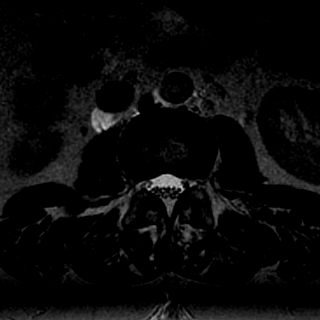
[im 28/39]
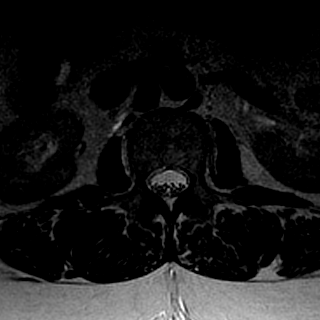
[im 33/39]
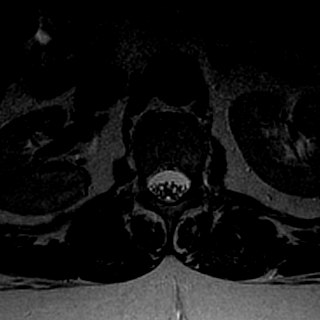
[im 39/39]
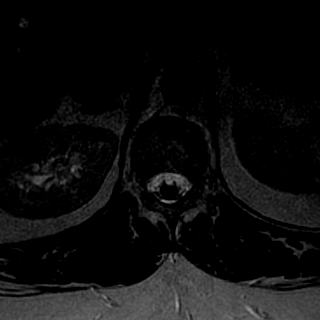

[Series 7: T1 · axial · 4.0mm · 0.23mm/px · z∈[-77,+62]mm · 3 of 39 slices shown (2 of 2)]
[im 6/39]
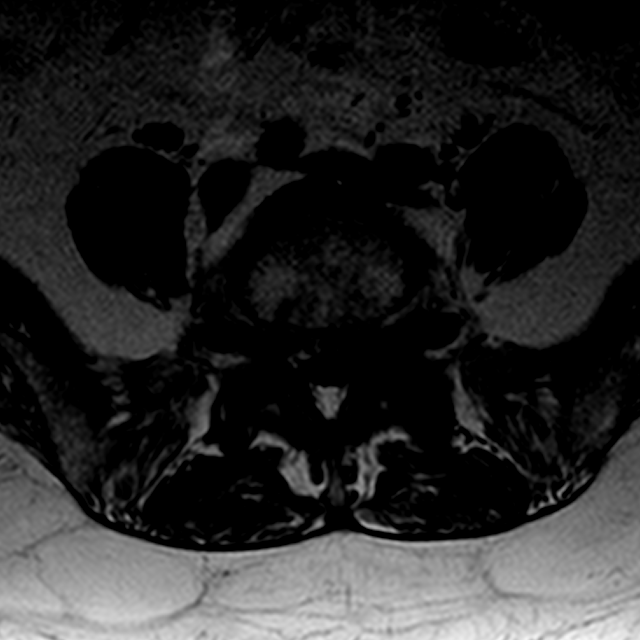
[im 21/39]
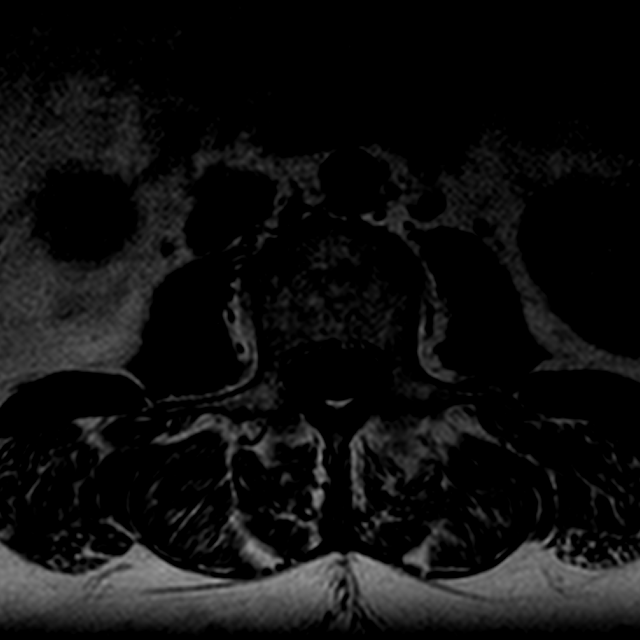
[im 33/39]
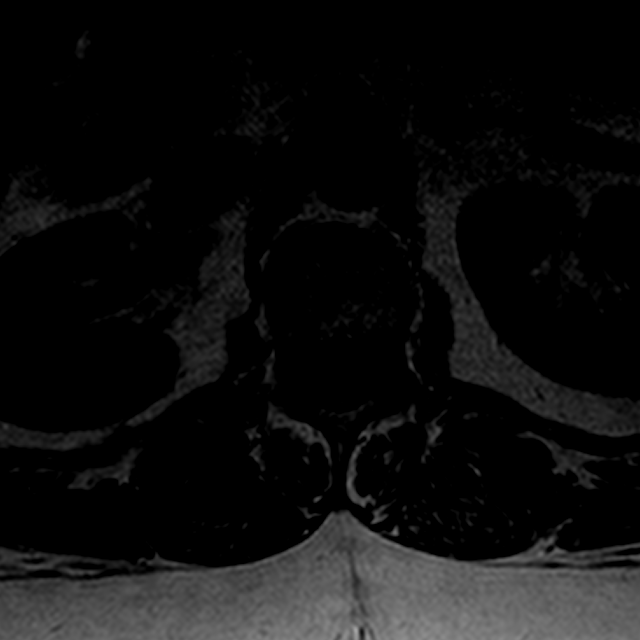

[23 of 48 positions shown; findings below may reference images not displayed]

FINDINGS: Visualized abdominal viscera and paraspinal soft tissues
are within normal limits.   Visualized lower thoracic spinal cord
is normal with conus medularis at L1-L2.  Stable vertebral height
and alignment, trace anterolisthesis of L3 on L4. No marrow edema
or evidence of acute osseous abnormality.

T10-T11:  Negative.

T11-T12:  Increased disc space narrowing and circumferential disc
bulge.  Mild bilateral T11 foraminal stenosis.

T12-L1:  Mild facet hypertrophy.  Otherwise negative.

L1-L2:  Mild circumferential disc bulge and facet hypertrophy is
stable.  No stenosis.

L2-L3:  Negative.

L3-L4:  Chronic left eccentric circumferential disc bulge, maximal
at the level of the left lateral recess.  Stable superimposed
moderate facet and ligament flavum hypertrophy.  Probable
developing of ligamental synovial cyst, but laterally and will not
result in neural impingement.  Mild to moderate spinal stenosis and
moderate left lateral recess stenosis are stable.  Mild deviation
of the extraforaminal left L3 nerve root is stable.

L4-L5:  Chronic circumferential disc protrusion, moderate facet,
and moderate ligament flavum hypertrophy.  Combined moderate spinal
stenosis with moderate to severe bilateral lateral recess stenosis
appears not significantly changed.  Stable mild right L4 foraminal
stenosis.

L5-S1:  Chronic circumferential disc protrusion with moderate facet
and moderate ligament flavum hypertrophy.  Small posteriorly
situated synovial cyst on the left is unchanged and will not cause
neural compromise.  Epidural lipomatosis.  Moderate spinal stenosis
is stable.  Moderate left greater than right lateral recess
stenosis is not significantly changed.  Severe bilateral L5
foraminal stenosis is stable.
IMPRESSION: 1.  Multifactorial L3-L4 through L5-S1 spinal stenosis, lateral
recess stenosis, and foraminal stenosis not significantly changed
as detailed above.
2.  No acute findings in the lumbar spine.

## 2011-08-21 NOTE — Telephone Encounter (Signed)
Needs urine submitted for UA and c/s can put in order, she can have someone drop off at the lab if they collect the cup sounds like a UTI

## 2011-08-21 NOTE — Telephone Encounter (Signed)
Her urine was normal Friday but she is having pressure at the bottom of her stomach and frequency. States her feet have been swelling and also her sugar was running higher than usual. (220 fasting this am and 215 yesterday am) Wants to know what she needs to do. She can't come back in because she rides the [place]

## 2011-08-22 ENCOUNTER — Other Ambulatory Visit: Payer: Self-pay | Admitting: Family Medicine

## 2011-08-24 NOTE — Telephone Encounter (Signed)
Will try to get someone to come by tomorrow for a urine cup

## 2011-08-31 ENCOUNTER — Telehealth: Payer: Self-pay | Admitting: Family Medicine

## 2011-09-01 ENCOUNTER — Telehealth: Payer: Self-pay | Admitting: Family Medicine

## 2011-09-01 NOTE — Telephone Encounter (Signed)
Called patient and left message for them to return call at the office   

## 2011-09-01 NOTE — Telephone Encounter (Signed)
See previous message. Already tried to call patient and left message

## 2011-09-01 NOTE — Telephone Encounter (Signed)
Home health nurse, Nicki , called stating that she found out patient is self medicating with advil/tylenol pM ,I an't remember which. She also checked the pharmacy to see when pt had bought the bottle and she has been taking the pills  often, approx 22 pills in maybe 10 days!   Please let [name] know that we all care about her, which is why Nicki called, and I am asking you to spk with her . She needs to stop all OTC medication espescialy ones that are potentially sedating like the "PM". She takes too many prescription medications that can potentially make her sleepy for this to be safe for her to do.  Thanks

## 2011-09-04 ENCOUNTER — Telehealth: Payer: Self-pay | Admitting: Family Medicine

## 2011-09-04 ENCOUNTER — Other Ambulatory Visit: Payer: Self-pay | Admitting: Family Medicine

## 2011-09-04 MED ORDER — CYCLOBENZAPRINE HCL 10 MG PO TABS: ORAL_TABLET | ORAL | Status: DC

## 2011-09-04 NOTE — Telephone Encounter (Signed)
This new referral will be made at her next visit pls let her know

## 2011-09-04 NOTE — Telephone Encounter (Signed)
Said she thought the advil PM was stronger. Said she will not take again. ALSO she said she takes her flexeril 10mg  bid and she only wants to take it when she needs it. Can it be changed to as needed only?

## 2011-09-04 NOTE — Telephone Encounter (Signed)
I spoke to [place] and Lailee was talking in the background saying she only wanted the flexeril as needed. She doesn't want to take them everyday. I told Onell that you would change it to once daily as needed and Britt Boozer said to fax [facility] those directions and she would stop packing them in her pill box unless she needs it.  Britt Boozer wanted you to know Mental health also put her back on trazodone but she cannot find it today. FYI

## 2011-09-04 NOTE — Telephone Encounter (Signed)
BP was 146/50. Has bad headache/  This am it was 99/60. Is going to hold on her BP med and see if its still low tomorrow. Advised appt but she is going to wait and see  Pt aware of the advil pm and not to take

## 2011-09-04 NOTE — Telephone Encounter (Signed)
New direction written let pt know 10 to last 30 days, so no more than 1 every 3 days , if needed pls pls fax in

## 2011-09-04 NOTE — Telephone Encounter (Signed)
Find out how many days per week she is taking more than one, i want to reduce dose to once daily

## 2011-09-04 NOTE — Telephone Encounter (Signed)
Patient aware.

## 2011-09-11 ENCOUNTER — Telehealth: Payer: Self-pay | Admitting: Family Medicine

## 2011-09-18 ENCOUNTER — Telehealth: Payer: Self-pay | Admitting: Family Medicine

## 2011-09-19 NOTE — Telephone Encounter (Signed)
Spoke with office and they state [name] is to come off her plavix July 12 for procedure on July 19 and per Dr Lodema Hong they are aware that it is fine

## 2011-09-20 ENCOUNTER — Telehealth: Payer: Self-pay

## 2011-09-20 ENCOUNTER — Ambulatory Visit: Payer: Medicare Other | Admitting: Family Medicine

## 2011-09-20 NOTE — Telephone Encounter (Signed)
No encounter with foot exam this year to date, needs an encounter with foot exam will be done at next visit, let her know to specifically request this when she comes next time pls

## 2011-09-20 NOTE — Telephone Encounter (Signed)
Wants to know if she can have a pill sent in to help her quit smoking. She has been smoking again 1-2 packs daily. Said please send to [facility]

## 2011-09-20 NOTE — Telephone Encounter (Signed)
No pill available for her that I prescribe, will not prescribe wellbutrin, she can use the nicotrol inhaler which is oTC, no smoking with inhaler, al;so giver the quit line number

## 2011-09-26 NOTE — Telephone Encounter (Signed)
Pt aware.

## 2011-09-27 NOTE — Telephone Encounter (Signed)
Noted  

## 2011-09-28 ENCOUNTER — Telehealth: Payer: Self-pay | Admitting: Family Medicine

## 2011-10-03 ENCOUNTER — Telehealth: Payer: Self-pay | Admitting: Family Medicine

## 2011-10-03 NOTE — Telephone Encounter (Signed)
Please advise. Ok to get a skilled nurse to come out?

## 2011-10-03 NOTE — Telephone Encounter (Signed)
Also per Guy Sandifer she gave [facility] the ok to meet her here at her next appt to adjust her spinal cord stimulator since she cannot travel to [place] to have it done. Janece from the company called and wanted to make sure this was ok with you. [phone]

## 2011-10-03 NOTE — Telephone Encounter (Signed)
She needs her sleep doctor to order her cpap and the equipment with it , pls let her know. pls find out how long she has had current hosp bed, medicare coverage will depend on this regardless of the supplier. She can address that at next visit when she is sure of date because it is impt she is sure how long she has the current bed

## 2011-10-03 NOTE — Telephone Encounter (Signed)
[facility] , if she needs a skilled nurse as a result of her surgery, she needs to specifically as the surgeon to make recommendations for this and for how long needed

## 2011-10-04 NOTE — Telephone Encounter (Signed)
States she has had it 20 years and it is all messed up and she was told that she qualified for a hospital bed through a certain company. The number is [facility]. [phone].

## 2011-10-04 NOTE — Telephone Encounter (Signed)
Patient aware.

## 2011-10-05 NOTE — Telephone Encounter (Signed)
Script written pls let her know and fax 

## 2011-10-05 NOTE — Telephone Encounter (Signed)
Script written let her know and fax pls

## 2011-10-06 ENCOUNTER — Other Ambulatory Visit: Payer: Self-pay | Admitting: Gastroenterology

## 2011-10-06 NOTE — Telephone Encounter (Signed)
Patient aware and rx faxed 

## 2011-10-09 ENCOUNTER — Telehealth: Payer: Self-pay

## 2011-10-09 NOTE — Telephone Encounter (Signed)
Noted, thanks!

## 2011-10-09 NOTE — Telephone Encounter (Signed)
I spoke with [facility] and they said before they can get her a new hospital bed, we have to see her in the office and in the notes it has to be a documented need for the bed. She will let Herbert know and they will get [name] the medicare requirements before she comes in

## 2011-10-13 ENCOUNTER — Telehealth: Payer: Self-pay | Admitting: Family Medicine

## 2011-10-13 NOTE — Telephone Encounter (Signed)
Faxed over

## 2011-10-18 ENCOUNTER — Emergency Department (HOSPITAL_COMMUNITY): Payer: Medicare Other

## 2011-10-18 ENCOUNTER — Emergency Department (HOSPITAL_COMMUNITY)
Admission: EM | Admit: 2011-10-18 | Discharge: 2011-10-18 | Disposition: A | Discharge status: Home / Self Care | Payer: Medicare Other | Attending: Emergency Medicine | Admitting: Emergency Medicine

## 2011-10-18 ENCOUNTER — Encounter (HOSPITAL_COMMUNITY): Payer: Self-pay | Admitting: *Deleted

## 2011-10-18 DIAGNOSIS — F319 Bipolar disorder, unspecified: Secondary | ICD-10-CM | POA: Insufficient documentation

## 2011-10-18 DIAGNOSIS — D329 Benign neoplasm of meninges, unspecified: Secondary | ICD-10-CM

## 2011-10-18 DIAGNOSIS — Z87891 Personal history of nicotine dependence: Secondary | ICD-10-CM | POA: Insufficient documentation

## 2011-10-18 DIAGNOSIS — D320 Benign neoplasm of cerebral meninges: Secondary | ICD-10-CM | POA: Insufficient documentation

## 2011-10-18 DIAGNOSIS — R209 Unspecified disturbances of skin sensation: Secondary | ICD-10-CM | POA: Insufficient documentation

## 2011-10-18 DIAGNOSIS — I10 Essential (primary) hypertension: Secondary | ICD-10-CM | POA: Insufficient documentation

## 2011-10-18 DIAGNOSIS — Z79899 Other long term (current) drug therapy: Secondary | ICD-10-CM | POA: Insufficient documentation

## 2011-10-18 DIAGNOSIS — Z8673 Personal history of transient ischemic attack (TIA), and cerebral infarction without residual deficits: Secondary | ICD-10-CM | POA: Insufficient documentation

## 2011-10-18 DIAGNOSIS — Q851 Tuberous sclerosis: Secondary | ICD-10-CM | POA: Insufficient documentation

## 2011-10-18 DIAGNOSIS — R29898 Other symptoms and signs involving the musculoskeletal system: Secondary | ICD-10-CM | POA: Insufficient documentation

## 2011-10-18 DIAGNOSIS — F341 Dysthymic disorder: Secondary | ICD-10-CM | POA: Insufficient documentation

## 2011-10-18 DIAGNOSIS — G4730 Sleep apnea, unspecified: Secondary | ICD-10-CM | POA: Insufficient documentation

## 2011-10-18 DIAGNOSIS — R51 Headache: Secondary | ICD-10-CM | POA: Insufficient documentation

## 2011-10-18 DIAGNOSIS — E119 Type 2 diabetes mellitus without complications: Secondary | ICD-10-CM | POA: Insufficient documentation

## 2011-10-18 DIAGNOSIS — M79609 Pain in unspecified limb: Secondary | ICD-10-CM | POA: Insufficient documentation

## 2011-10-18 LAB — CBC WITH DIFFERENTIAL/PLATELET
Eosinophils Absolute: 0.1 10*3/uL (ref 0.0–0.7)
Eosinophils Relative: 2 % (ref 0–5)
HCT: 34.6 % — ABNORMAL LOW (ref 36.0–46.0)
Hemoglobin: 10.5 g/dL — ABNORMAL LOW (ref 12.0–15.0)
Lymphocytes Relative: 29 % (ref 12–46)
Lymphs Abs: 2.1 10*3/uL (ref 0.7–4.0)
MCH: 21.5 pg — ABNORMAL LOW (ref 26.0–34.0)
MCV: 70.9 fL — ABNORMAL LOW (ref 78.0–100.0)
Monocytes Absolute: 0.6 10*3/uL (ref 0.1–1.0)
Monocytes Relative: 8 % (ref 3–12)
Platelets: 223 10*3/uL (ref 150–400)
RBC: 4.88 MIL/uL (ref 3.87–5.11)
WBC: 7.3 10*3/uL (ref 4.0–10.5)

## 2011-10-18 LAB — BASIC METABOLIC PANEL
BUN: 33 mg/dL — ABNORMAL HIGH (ref 6–23)
CO2: 27 mEq/L (ref 19–32)
Calcium: 10.5 mg/dL (ref 8.4–10.5)
Glucose, Bld: 144 mg/dL — ABNORMAL HIGH (ref 70–99)
Sodium: 140 mEq/L (ref 135–145)

## 2011-10-18 LAB — URINALYSIS, ROUTINE W REFLEX MICROSCOPIC
Bilirubin Urine: NEGATIVE
Glucose, UA: NEGATIVE mg/dL
Leukocytes, UA: NEGATIVE
Nitrite: NEGATIVE
Specific Gravity, Urine: 1.02 (ref 1.005–1.030)
pH: 6 (ref 5.0–8.0)

## 2011-10-18 LAB — PROTIME-INR: INR: 1.05 (ref 0.00–1.49)

## 2011-10-18 MED ORDER — DEXAMETHASONE SODIUM PHOSPHATE 4 MG/ML IJ SOLN
10.0000 mg | Freq: Once | INTRAMUSCULAR | Status: AC
  Administered 2011-10-18: 10 mg via INTRAVENOUS
  Filled 2011-10-18: qty 3
  Filled 2011-10-18: qty 1

## 2011-10-18 MED ORDER — DEXAMETHASONE 0.75 MG PO TABS: 4.0000 mg | ORAL_TABLET | Freq: Three times a day (TID) | ORAL | Status: AC

## 2011-10-18 MED ORDER — GADOBENATE DIMEGLUMINE 529 MG/ML IV SOLN
15.0000 mL | Freq: Once | INTRAVENOUS | Status: AC | PRN
  Administered 2011-10-18: 15 mL via INTRAVENOUS

## 2011-10-18 NOTE — ED Notes (Signed)
Pt still in x-ray

## 2011-10-18 NOTE — ED Notes (Signed)
Pt requesting something to drink, advised that she can not have anything to eat or drink per Dr. Deretha Emory

## 2011-10-18 NOTE — ED Notes (Signed)
Discharge instructions reviewed.

## 2011-10-18 NOTE — ED Notes (Signed)
Pt eating dinner tray °

## 2011-10-18 NOTE — ED Notes (Signed)
Pt states pain behind left knee x 1 week. Pt also states numbness to left hand began this morning and is intermittent.  Chronic lower back pain.

## 2011-10-18 NOTE — ED Provider Notes (Addendum)
History  This chart was scribed for Shelda Jakes, MD by Bennett Scrape. This patient was seen in room [facility] and the patient's care was started at 1:11PM.  CSN: [id number]  Arrival date & time 10/18/11  1052   First MD Initiated Contact with Patient 10/18/11 1311      Chief Complaint  Patient presents with  . Numbness  . Leg Pain     Patient is a 61 y.o. female presenting with extremity weakness. The history is provided by the patient. No language interpreter was used.  Extremity Weakness This is a new problem. The current episode started 6 to 12 hours ago. The problem occurs constantly. The problem has not changed since onset.Associated symptoms include headaches. Pertinent negatives include no chest pain, no abdominal pain and no shortness of breath. Nothing aggravates the symptoms. Nothing relieves the symptoms.    [name] is a 61 y.o. female with a h/o prior CVA who presents to the Emergency Department complaining of 8 hours of sudden onset, non-changing, constant numbness to the top of bilateral feet with associated left hand weakness and numbness. She states that she noticed that she was dropping objects out of her left hand which is unusual for her. She denies any modifying factors and denies prior episodes of similar symptoms. She also c/o one week of posterior left knee pain and 2 days of generalized HA. She denies any recent injuries or falls. Pt reports experiencing chronic numbness, weakness and pain in the left knee from chronic back pain and she has a h/o migraines. She states that the left knee pain she is experiencing now is a different from the chronic knee pain. She also admitted to seeing hallucinations which she attributed to her h/o bipolar disorder. She denies fever, CP, abdominal pain, nausea, visual disturbance, rash and urinary symptoms as associated symptoms. She also has a h/o HTN, anemia, seizures and depression. She is a former smoker but denies  alcohol use.  She is right-handed.  PCP is Dr. Lodema Hong.   Past Medical History  Diagnosis Date  . Bipolar disorder   . CVA (cerebral infarction)   . Pancreatitis 2006    due to Depakote with normal EUS   . Osteoporosis   . Sleep apnea   . Chronic back pain   . Diabetes mellitus   . Trigger finger   . Anxiety disorder   . Hypertension   . Migraines   . Diverticulosis     TCS 9/08 by Dr. Lina Sar for diarrhea . Bx for micro scopic colitis negative.   . Schatzki's ring     non critical / EGD with ED 8/2011with RMR  . S/P colonoscopy 1478,2956, 2011    left-sided diverticula, hx of simple adenomas . 2011, random bx negative for microscopic colitis  . Glaucoma   . Allergic rhinitis   . Hypothyroidism     thyroid goiter  . Anemia   . Blood transfusion   . GERD (gastroesophageal reflux disease)   . Stroke     left sided weakness  . Seizures     unknown etiology-on meds-last seizure was 3 yerars ago  . Anxiety   . Depression   . Metabolic encephalopathy 08/03/2011    Past Surgical History  Procedure Date  . Abdominal hysterectomy   . Cholecystectomy   . Ovarian cyst removal   . Carpal tunnel release 07/22/04    left/ Dr. Romeo Apple   . Breast reduction surgery   . Bilateral cataract surgery   .  Biopsy of thyroid gland 2009  . Surgical excision of 3 tumors from right thigh and right buttock  and left upper thigh 2010  . Back surgery July 2012  . Spine surgery 09/29/2010    dr brooks  . Maloney dilation 12/29/2010    Procedure: Elease Hashimoto DILATION;  Surgeon: Corbin Ade, MD;  Location: [facility];  Service: Endoscopy;  Laterality: N/A;  56, 58,  . Esophagogastroduodenoscopy 12/29/2010    Rourk-Retained food in the esophagus and stomach, small hiatal hernia, status post Maloney dilation of the esophagus    Family History  Problem Relation Age of Onset  . Heart attack Mother   . Pneumonia Father   . Kidney failure Father   . Cancer Sister     pancreatic  . Diabetes  Brother   . Hypertension Brother   . Colon cancer Neg Hx   . Anesthesia problems Neg Hx   . Hypotension Neg Hx   . Malignant hyperthermia Neg Hx   . Pseudochol deficiency Neg Hx     History  Substance Use Topics  . Smoking status: Former Smoker    Quit date: 12/22/1992  . Smokeless tobacco: Not on file  . Alcohol Use: No    No OB history provided.  Review of Systems  Constitutional: Negative for fever and chills.  HENT: Negative for congestion and sore throat.   Eyes: Negative for visual disturbance.  Respiratory: Negative for cough and shortness of breath.   Cardiovascular: Negative for chest pain.  Gastrointestinal: Negative for nausea, vomiting, abdominal pain and diarrhea.  Genitourinary: Negative for dysuria and frequency.  Musculoskeletal: Positive for back pain and extremity weakness.  Skin: Negative for rash.  Neurological: Positive for weakness, numbness and headaches. Negative for seizures.  Psychiatric/Behavioral: Positive for hallucinations.    Allergies  Cephalexin; Milk-related compounds; Penicillins; and Phenazopyridine hcl  Home Medications   Current Outpatient Rx  Name Route Sig Dispense Refill  . ANTIVERT 12.5 MG PO TABS  TAKE 1 TABLET BY MOUTH 3 TIMES DAILY AS NEEDED FOR DIZZINESS. 30 each 3  . CLONIDINE HCL 0.3 MG PO TABS  TAKE 1 TABLET BY MOUTH 3 TIMES DAILY: TAKE AT 8 A.M.;4 P.M.; AND MIDNIGHT. 90 tablet 3  . CRESTOR 10 MG PO TABS  TAKE 1 TABLET BY MOUTH AT BEDTIME. 30 each 3  . CYCLOBENZAPRINE HCL 10 MG PO TABS  One tablet once daily as needed for spasm.  Ten tablets to last 30 days 10 tablet 1    Dose reduction effective 09/04/2011  . CYCLOSPORINE 0.05 % OP EMUL  1 drop 2 (two) times daily.    [name] DIPHENOXYLATE-ATROPINE 2.5-0.025 MG PO TABS Oral Take 1 tablet by mouth 4 (four) times daily as needed. For loose stools    . ERGOCALCIFEROL 50000 UNITS PO CAPS Oral Take 50,000 Units by mouth once a week. Patient takes on Sundays    . FLUTICASONE  PROPIONATE 50 MCG/ACT NA SUSP Nasal Place 1 spray into the nose daily. Allergies 16 g 3  . GLIPIZIDE 10 MG PO TABS Oral Take 1 tablet (10 mg total) by mouth daily before breakfast. 30 tablet 11    Dose reduction effective 08/17/2011  . LAMOTRIGINE 100 MG PO TABS Oral Take 100 mg by mouth every morning.     [name] LEVOTHYROXINE SODIUM 50 MCG PO TABS Oral Take 50 mcg by mouth every morning.     [name] LORAZEPAM 1 MG PO TABS Oral Take 0.5 tablets (0.5 mg total) by mouth 3 (three) times  daily. 30 tablet   . METFORMIN HCL 1000 MG PO TABS Oral Take 1 tablet (1,000 mg total) by mouth 2 (two) times daily with a meal. 60 tablet 11    Please note  That patient has changed from liquid  ...  . METOPROLOL TARTRATE 50 MG PO TABS Oral Take 50 mg by mouth 2 (two) times daily.    [name] MIRTAZAPINE 30 MG PO TABS Oral Take 30 mg by mouth at bedtime.     [name] MONTELUKAST SODIUM 10 MG PO TABS Oral Take 10 mg by mouth at bedtime.     . MORPHINE SULFATE 15 MG PO TABS Oral Take 15 mg by mouth 2 (two) times daily as needed. For pain    . NYSTATIN 100000 UNIT/GM EX POWD  APPLY TO AFFECTED AREAS TWICE DAILY. 60 g 2  . ONGLYZA 5 MG PO TABS  TAKE 1 TABLET BY MOUTH ONCE DAILY. 30 tablet 3  . PLAVIX 75 MG PO TABS  TAKE ONE TABLET BY MOUTH ONCE DAILY. 30 each 3    Dispense as written.  [name] PREGABALIN 75 MG PO CAPS Oral Take 75 mg by mouth at bedtime.     [name] PROMETHAZINE HCL 12.5 MG PO TABS Oral Take 12.5 mg by mouth every 6 (six) hours as needed. For nausea    . RABEPRAZOLE SODIUM 20 MG PO TBEC Oral Take 1 tablet (20 mg total) by mouth daily. 31 tablet 11  . SERTRALINE HCL 100 MG PO TABS Oral Take 150 mg by mouth at bedtime.     . THIOTHIXENE 2 MG PO CAPS Oral Take 2 mg by mouth at bedtime.     [name] TIZANIDINE HCL 4 MG PO TABS Oral Take 4 mg by mouth 3 (three) times daily as needed. For spasms    . VOLTAREN 1 % TD GEL  APPLY TWICE DAILY TO AFFECTED AREA(S) AS NEEDED FOR PAIN. 100 g 1  . ZOFRAN 4 MG PO TABS  TAKE 1 TABLET BY MOUTH EVERY 8 HOURS AS  NEEDED FOR NAUSEA. 20 each 0  . BD PEN NEEDLE MINI U/F 31G X 5 MM MISC  USE AS DIRECTED. 100 each 2  . DEXAMETHASONE 0.75 MG PO TABS Oral Take 5.5 tablets (4.125 mg total) by mouth 3 (three) times daily. 30 tablet 0  . ACCU-CHEK MULTICLIX LANCETS MISC  Use as instructed once daily  Dx 250.00 100 each 12    Triage Vitals: BP 131/51  Pulse 66  Temp 98.6 F (37 C) (Oral)  Resp 20  Ht 5\' 2"  (1.575 m)  Wt 152 lb (68.947 kg)  BMI 27.80 kg/m2  SpO2 91%  Physical Exam  Nursing note and vitals reviewed. Constitutional: She is oriented to person, place, and time. She appears well-developed and well-nourished.  HENT:  Head: Normocephalic and atraumatic.  Eyes: Conjunctivae and EOM are normal.  Neck: Normal range of motion. Neck supple.  Cardiovascular: Normal rate, regular rhythm and normal heart sounds.   No murmur heard. Pulmonary/Chest: Effort normal and breath sounds normal. No respiratory distress.  Abdominal: Soft. Bowel sounds are normal.  Musculoskeletal: Normal range of motion. She exhibits no edema.       No swelling in the left knee or left calf, pain behind left knee but no mass noted, cap refill is 1 second, posterior tibular pulse is 1+, foot is warm, patella does not appear to be dislocated  Neurological: She is alert and oriented to person, place, and time.  Normal strength in lower extremities, grip strength is normal, mild drift and weakness with lifting of the left arm  Skin: Skin is warm and dry.  Psychiatric: She has a normal mood and affect. Her behavior is normal.    ED Course  Procedures (including critical care time)  DIAGNOSTIC STUDIES: Oxygen Saturation is 91% on room air, adequate by my interpretation.    COORDINATION OF CARE: 1:30PM-Discussed treatment plan which doppler, CT of head and x-ray of left knee includes with pt at bedside and pt agreed to plan.   Date: 10/18/2011  Rate: 69  Rhythm: normal sinus rhythm  QRS Axis: normal  Intervals:  normal  ST/T Wave abnormalities: normal  Conduction Disutrbances:none  Narrative Interpretation:   Old EKG Reviewed: none available  Results for orders placed during the hospital encounter of 10/18/11  CBC WITH DIFFERENTIAL      Component Value Range   WBC 7.3  4.0 - 10.5 K/uL   RBC 4.88  3.87 - 5.11 MIL/uL   Hemoglobin 10.5 (*) 12.0 - 15.0 g/dL   HCT 56.2 (*) 13.0 - 86.5 %   MCV 70.9 (*) 78.0 - 100.0 fL   MCH 21.5 (*) 26.0 - 34.0 pg   MCHC 30.3  30.0 - 36.0 g/dL   RDW 78.4  69.6 - 29.5 %   Platelets 223  150 - 400 K/uL   Neutrophils Relative 61  43 - 77 %   Neutro Abs 4.4  1.7 - 7.7 K/uL   Lymphocytes Relative 29  12 - 46 %   Lymphs Abs 2.1  0.7 - 4.0 K/uL   Monocytes Relative 8  3 - 12 %   Monocytes Absolute 0.6  0.1 - 1.0 K/uL   Eosinophils Relative 2  0 - 5 %   Eosinophils Absolute 0.1  0.0 - 0.7 K/uL   Basophils Relative 0  0 - 1 %   Basophils Absolute 0.0  0.0 - 0.1 K/uL  BASIC METABOLIC PANEL      Component Value Range   Sodium 140  135 - 145 mEq/L   Potassium 3.7  3.5 - 5.1 mEq/L   Chloride 100  96 - 112 mEq/L   CO2 27  19 - 32 mEq/L   Glucose, Bld 144 (*) 70 - 99 mg/dL   BUN 33 (*) 6 - 23 mg/dL   Creatinine, Ser 2.84  0.50 - 1.10 mg/dL   Calcium 13.2  8.4 - 44.0 mg/dL   GFR calc non Af Amer 55 (*) >90 mL/min   GFR calc Af Amer 64 (*) >90 mL/min  PROTIME-INR      Component Value Range   Prothrombin Time 13.9  11.6 - 15.2 seconds   INR 1.05  0.00 - 1.49  URINALYSIS, ROUTINE W REFLEX MICROSCOPIC      Component Value Range   Color, Urine YELLOW  YELLOW   APPearance CLEAR  CLEAR   Specific Gravity, Urine 1.020  1.005 - 1.030   pH 6.0  5.0 - 8.0   Glucose, UA NEGATIVE  NEGATIVE mg/dL   Hgb urine dipstick NEGATIVE  NEGATIVE   Bilirubin Urine NEGATIVE  NEGATIVE   Ketones, ur NEGATIVE  NEGATIVE mg/dL   Protein, ur NEGATIVE  NEGATIVE mg/dL   Urobilinogen, UA 0.2  0.0 - 1.0 mg/dL   Nitrite NEGATIVE  NEGATIVE   Leukocytes, UA NEGATIVE  NEGATIVE   Ct Head Wo  Contrast  10/18/2011  *RADIOLOGY REPORT*  Clinical Data: Left leg weakness  CT HEAD WITHOUT CONTRAST  Technique:  Contiguous axial images were obtained from the base of the skull through the vertex without contrast.  Comparison: 08/02/2011  Findings: Periventricular white matter low density in the lower right frontal lobe is stable compatible with chronic ischemic changes.  Calcified anterior falcine meningioma is stable.  No new low density areas to suggest acute ischemic change.  No new mass effect, midline shift, or acute hemorrhage.  Mastoid air cells clear.  Visualized paranasal sinuses are clear.  IMPRESSION: No acute intracranial pathology.  Chronic changes.  Original Report Authenticated By: Donavan Burnet, M.D.   Results for orders placed during the hospital encounter of 10/18/11  CBC WITH DIFFERENTIAL      Component Value Range   WBC 7.3  4.0 - 10.5 K/uL   RBC 4.88  3.87 - 5.11 MIL/uL   Hemoglobin 10.5 (*) 12.0 - 15.0 g/dL   HCT 78.2 (*) 95.6 - 21.3 %   MCV 70.9 (*) 78.0 - 100.0 fL   MCH 21.5 (*) 26.0 - 34.0 pg   MCHC 30.3  30.0 - 36.0 g/dL   RDW 08.6  57.8 - 46.9 %   Platelets 223  150 - 400 K/uL   Neutrophils Relative 61  43 - 77 %   Neutro Abs 4.4  1.7 - 7.7 K/uL   Lymphocytes Relative 29  12 - 46 %   Lymphs Abs 2.1  0.7 - 4.0 K/uL   Monocytes Relative 8  3 - 12 %   Monocytes Absolute 0.6  0.1 - 1.0 K/uL   Eosinophils Relative 2  0 - 5 %   Eosinophils Absolute 0.1  0.0 - 0.7 K/uL   Basophils Relative 0  0 - 1 %   Basophils Absolute 0.0  0.0 - 0.1 K/uL  BASIC METABOLIC PANEL      Component Value Range   Sodium 140  135 - 145 mEq/L   Potassium 3.7  3.5 - 5.1 mEq/L   Chloride 100  96 - 112 mEq/L   CO2 27  19 - 32 mEq/L   Glucose, Bld 144 (*) 70 - 99 mg/dL   BUN 33 (*) 6 - 23 mg/dL   Creatinine, Ser 6.29  0.50 - 1.10 mg/dL   Calcium 52.8  8.4 - 41.3 mg/dL   GFR calc non Af Amer 55 (*) >90 mL/min   GFR calc Af Amer 64 (*) >90 mL/min  PROTIME-INR      Component Value Range    Prothrombin Time 13.9  11.6 - 15.2 seconds   INR 1.05  0.00 - 1.49  URINALYSIS, ROUTINE W REFLEX MICROSCOPIC      Component Value Range   Color, Urine YELLOW  YELLOW   APPearance CLEAR  CLEAR   Specific Gravity, Urine 1.020  1.005 - 1.030   pH 6.0  5.0 - 8.0   Glucose, UA NEGATIVE  NEGATIVE mg/dL   Hgb urine dipstick NEGATIVE  NEGATIVE   Bilirubin Urine NEGATIVE  NEGATIVE   Ketones, ur NEGATIVE  NEGATIVE mg/dL   Protein, ur NEGATIVE  NEGATIVE mg/dL   Urobilinogen, UA 0.2  0.0 - 1.0 mg/dL   Nitrite NEGATIVE  NEGATIVE   Leukocytes, UA NEGATIVE  NEGATIVE   Dg Chest 2 View  10/18/2011  *RADIOLOGY REPORT*  Clinical Data: Numbness in the left leg, weakness  CHEST - 2 VIEW  Comparison: Portable chest x-ray of 08/01/2011  Findings: Bibasilar linear atelectasis or scarring remains.  No active infiltrate or effusion is seen.  The heart is within normal  limits in size.  No bony abnormality is seen.  IMPRESSION: No active lung disease.  No change in mild bibasilar linear atelectasis or scarring.  Original Report Authenticated By: Juline Patch, M.D.   Ct Head Wo Contrast  10/18/2011  *RADIOLOGY REPORT*  Clinical Data: Left leg weakness  CT HEAD WITHOUT CONTRAST  Technique:  Contiguous axial images were obtained from the base of the skull through the vertex without contrast.  Comparison: 08/02/2011  Findings: Periventricular white matter low density in the lower right frontal lobe is stable compatible with chronic ischemic changes.  Calcified anterior falcine meningioma is stable.  No new low density areas to suggest acute ischemic change.  No new mass effect, midline shift, or acute hemorrhage.  Mastoid air cells clear.  Visualized paranasal sinuses are clear.  IMPRESSION: No acute intracranial pathology.  Chronic changes.  Original Report Authenticated By: Donavan Burnet, M.D.   Mr Laqueta Jean Wo Contrast  10/18/2011  *RADIOLOGY REPORT*  Clinical Data: 61 year old female with left side numbness and  headache.  Chronic planum sphenoidale meningioma.  MRI HEAD WITHOUT AND WITH CONTRAST  Technique:  Multiplanar, multiecho pulse sequences of the brain and surrounding structures were obtained according to standard protocol without and with intravenous contrast  Contrast: 15mL MULTIHANCE GADOBENATE DIMEGLUMINE 529 MG/ML IV SOLN  Comparison: Brain MRI 01/13/2008.  Findings: There is now cerebral edema associated with the midline and sphenoid meningioma.  This is greater on the right side and primarily involving the area of the gyrus rectus.  Meningioma today measures 26 x 25 x 19 mm (transverse by [facility] by CC) and measured 12- 13 mm diameter into 1009.  It continues to homogeneously enhance.  No other abnormal intracranial enhancement or discrete intracranial mass.  No restricted diffusion to suggest acute infarction.  No ventriculomegaly. No acute intracranial hemorrhage identified. Outside of the subfrontal region, supratentorial gray and white matter signal is stable and within normal limits.  There is chronic moderate to severe T2 hyperintensity in the pons, unchanged in 2009. Major intracranial vascular flow voids are stable.  Negative visualized cervical spine.  Negative pituitary cervicomedullary junction.  Normal bone marrow signal.  Stable orbit soft tissues. Visualized paranasal sinuses and mastoids are clear.  Negative scalp soft tissues.  IMPRESSION: 1.  Chronic planum sphenoidale meningioma has increased in size since 2009 and now is associated with acute cerebral edema in the inferior frontal gyri, greater on the right. 2.  No other acute intracranial abnormality.  Original Report Authenticated By: Harley Hallmark, M.D.   US Venous Img Lower Unilateral Left  10/18/2011  *RADIOLOGY REPORT*  Clinical Data: Left leg pain.  Question DVT.  LEFT LOWER EXTREMITY VENOUS DUPLEX ULTRASOUND  Technique:  Gray-scale sonography with graded compression, as well as color Doppler and duplex ultrasound were performed to  evaluate the deep venous system of the lower extremity from the level of the common femoral vein through the popliteal and proximal calf veins. Spectral Doppler was utilized to evaluate flow at rest and with distal augmentation maneuvers.  Comparison:  Prior examination 07/18/2010.  Findings:  Normal compressibility of the common femoral, superficial femoral, and popliteal veins is demonstrated, as well as the visualized proximal calf veins.  No filling defects to suggest DVT on grayscale or color Doppler imaging.  Doppler waveforms show normal direction of venous flow, normal respiratory phasicity and response to augmentation.  IMPRESSION: No evidence of acute left lower extremity deep vein thrombosis.  Original Report Authenticated By: Gerrianne Scale,  M.D.      1. Meningioma       MDM  Patient with interesting and significant finding on MRI of the brain showing of IV enlargement of the meningioma from 2009 but more significant was brain swelling around it. Discussed with neurology and neurosurgery patient has been seen by the neurologist in the past. Also discussed with her primary care Dr. Neurology and neurosurgery of okay with patient going home on Decadron. Neurology will arrange for EEG study. Primary care doctor will evaluate whether they can determine who the patient was seen from neurosurgery in the past back in 2009 which never had an operation if not she will followup with the neurosurgeon on-call tonight. Patient given 10 mg Decadron in the emergency department before discharge. Neurology felt that some of the symptoms may have been due to a mild seizure certainly no evidence of acute stroke on the MRI.     I personally performed the services described in this documentation, which was scribed in my presence. The recorded information has been reviewed and considered.     Shelda Jakes, MD 10/18/11 [phone]   Addendum: Additional information regarding a request for the Doppler  ultrasound. Patient has had pain behind the left knee for one week, the study was ordered for this reason along with 2 picture the patient did not have a deep vein thrombus.  Shelda Jakes, MD 10/21/11 2017

## 2011-10-18 NOTE — ED Notes (Signed)
Pt brought to er by family with c/o numbness to left arm, pt states that she woke up with the numbness and that it comes and goes. C/o headache that started this am, denies any problems speech, vision, n/v, pt also c/o pain located behind left knee that started a week ago, weakness to left leg that started a year ago, pt states that she started to experience weakness to left leg when she started to experience problems with her lower back over a year ago. Cms intact all extremities. Pt alert, able to answer all questions, speech clear, face is symmetrical,

## 2011-10-19 ENCOUNTER — Other Ambulatory Visit: Payer: Self-pay | Admitting: Family Medicine

## 2011-10-19 ENCOUNTER — Other Ambulatory Visit: Payer: Self-pay

## 2011-10-19 ENCOUNTER — Telehealth: Payer: Self-pay | Admitting: Family Medicine

## 2011-10-19 DIAGNOSIS — D329 Benign neoplasm of meninges, unspecified: Secondary | ICD-10-CM

## 2011-10-19 DIAGNOSIS — R51 Headache: Secondary | ICD-10-CM

## 2011-10-19 NOTE — Telephone Encounter (Signed)
ED Doc Jodelle Gross spoke with me yesterday PM about the pt, [name] does need to be seen by neurosurgeon, Dr .Elpidio Galea. My understanding was that this was beng arranged from the Ed. Dr Jodelle Gross had spoken to Dr Jule Ser before he spoke with me also. I will enter a referral, please follow through and get an appointment set up and let pt know , [name] DOES need neurosurgery evaluation  [name] needs an EEG, i am referring her to Dr Gerilyn Pilgrim for that, he is also aware of patient's situation from her ED visit yesterday, please get both appt dates and let pt know

## 2011-10-20 ENCOUNTER — Telehealth: Payer: Self-pay | Admitting: Family Medicine

## 2011-10-20 NOTE — Telephone Encounter (Signed)
Pt should be seen by neurology prior to RFs. Please make her aware. Thanks

## 2011-10-20 NOTE — Telephone Encounter (Signed)
You told me she is refusing Dr Gerilyn Pilgrim so please refer to neurologist in [place] for eeg to asses for possile seizure activity, ask pt if she has a preference as to any specific neurology office  In [place] before scheduling pls.If not try [facility]

## 2011-10-20 NOTE — Telephone Encounter (Signed)
Noted  

## 2011-10-22 IMAGING — US US EXTREM LOW VENOUS*R*
1 series · 14 of 24 positions shown · non-contrast
Comparison: 08/06/2008

CLINICAL DATA: Chest pain, dyspnea, right calf pain and swelling

RIGHT LOWER EXTREMITY VENOUS DUPLEX ULTRASOUND
TECHNIQUE: Gray-scale sonography with graded compression, as well
as color Doppler and duplex ultrasound, were performed to evaluate
the deep venous system of the lower extremity from the level of the
common femoral vein through the popliteal and proximal calf veins.
Spectral Doppler was utilized to evaluate flow at rest and with
distal augmentation maneuvers.

[Series 1: us extrem low venous*right* · 14 of 25 slices shown]
[im 1/25]
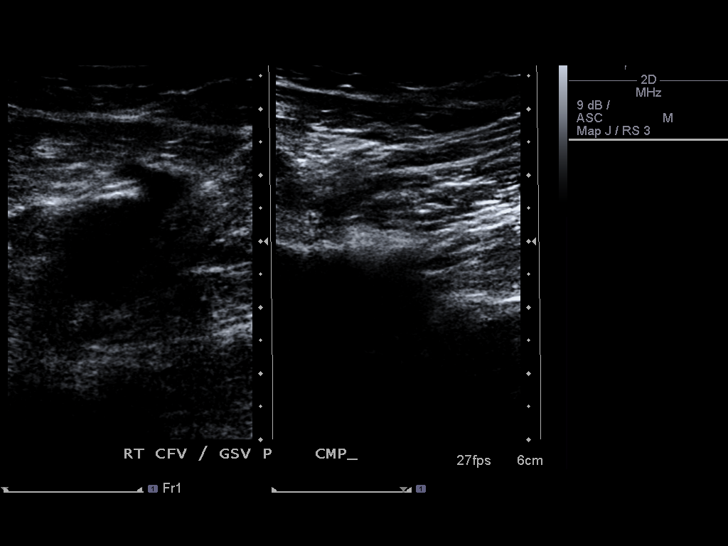
[im 3/25]
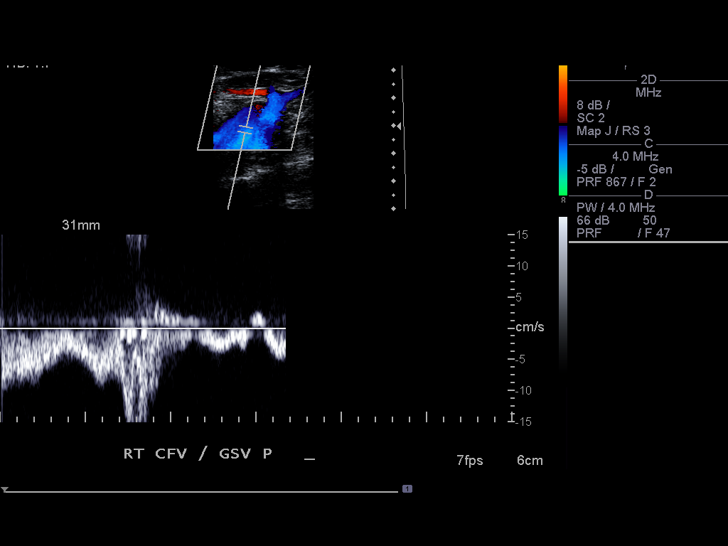
[im 5/25]
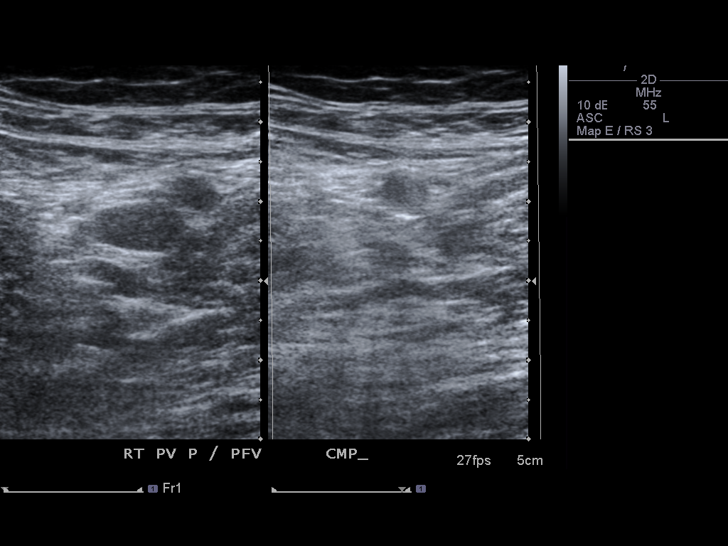
[im 7/25]
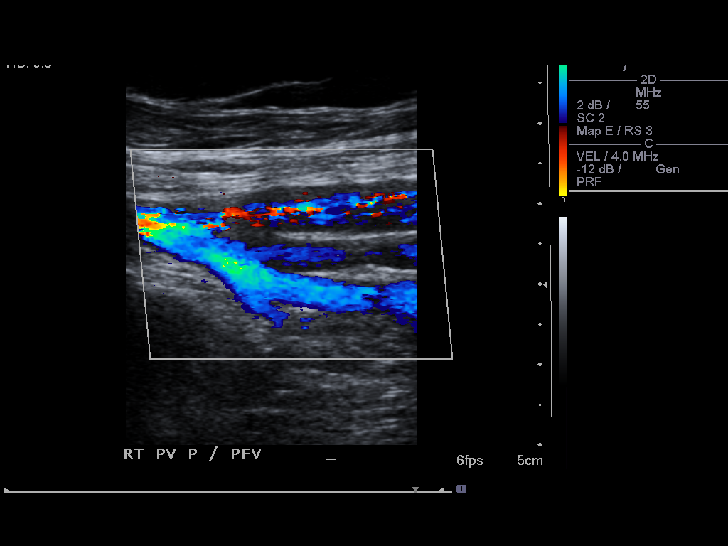
[im 8/25]
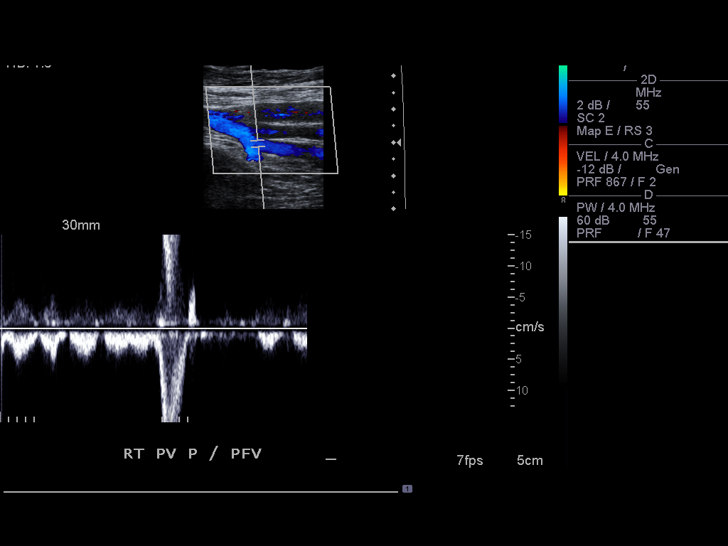
[im 10/25]
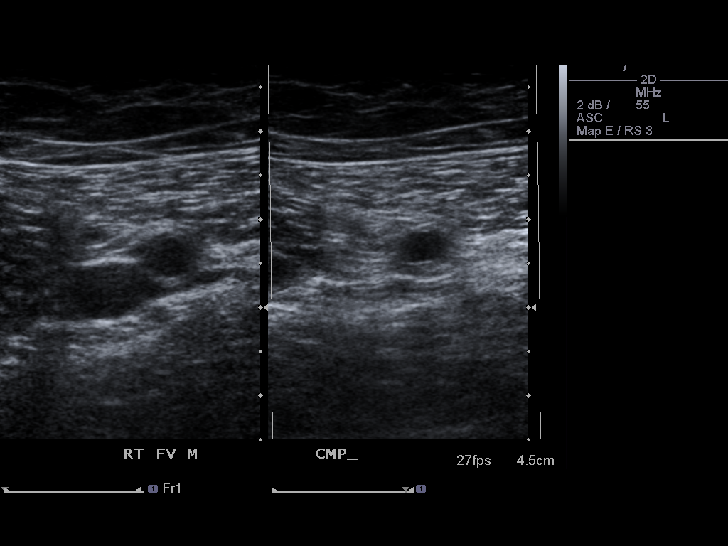
[im 12/25]
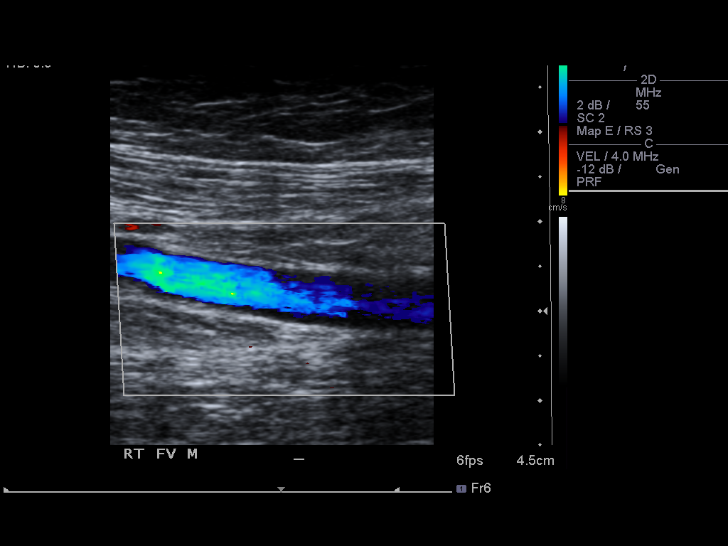
[im 13/25]
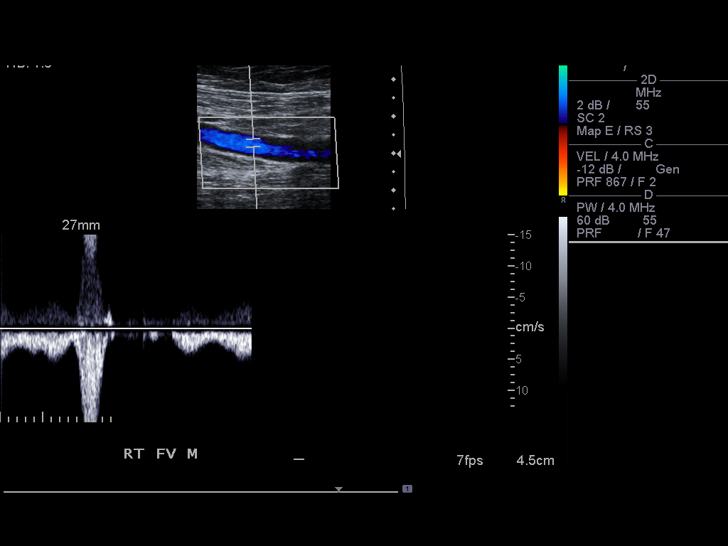
[im 15/25]
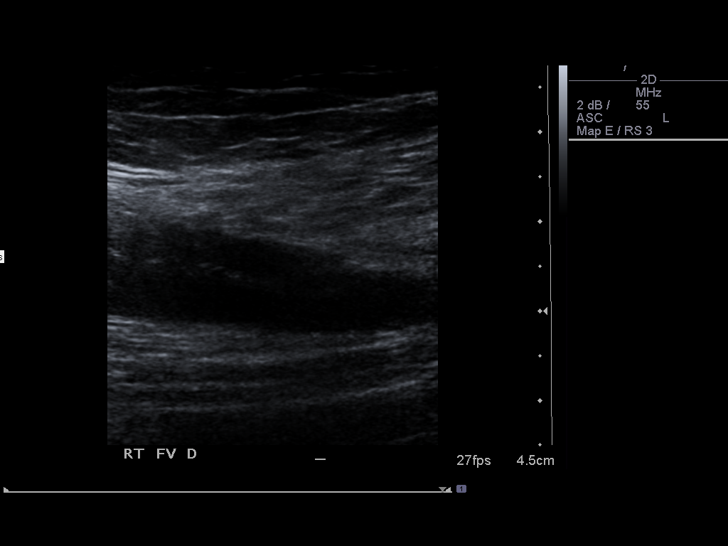
[im 17/25]
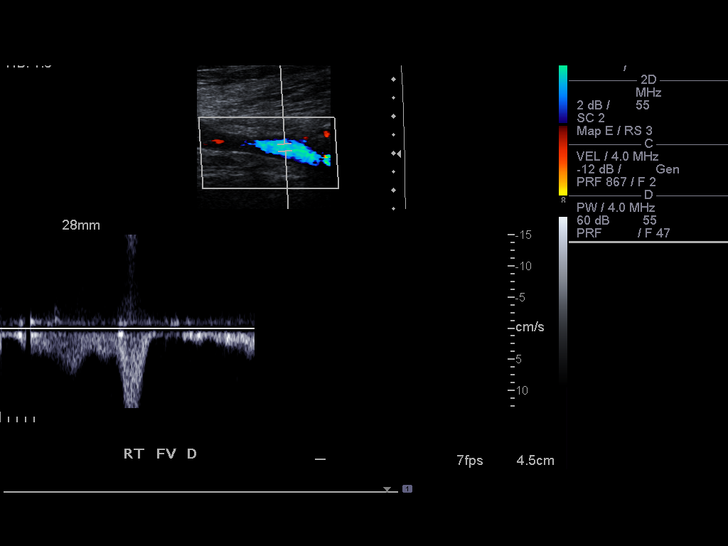
[im 19/25]
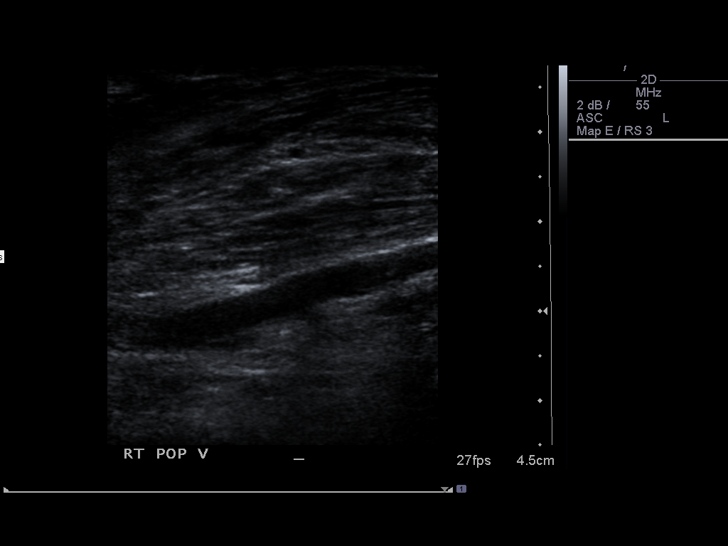
[im 20/25]
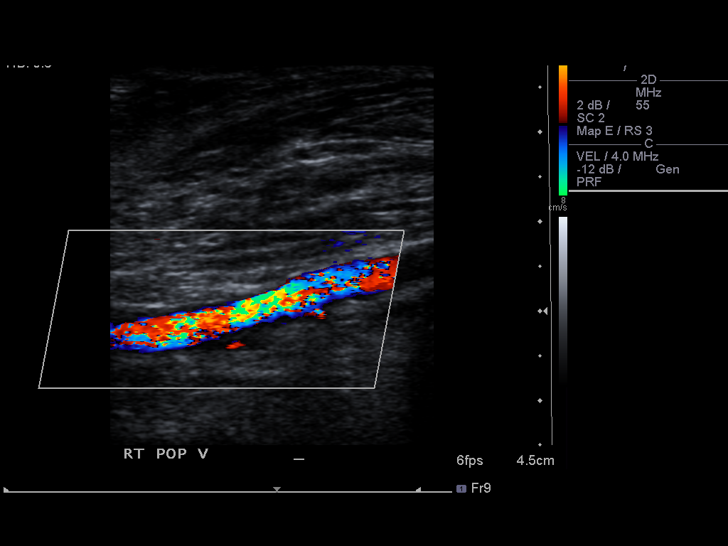
[im 22/25]
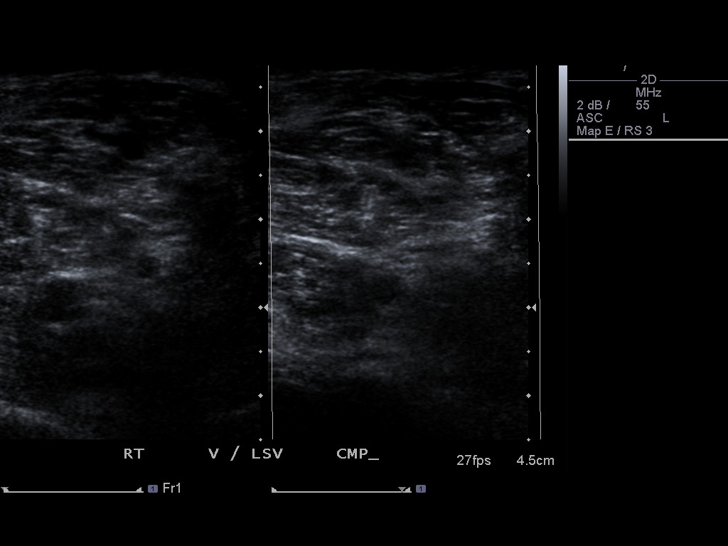
[im 25/25]
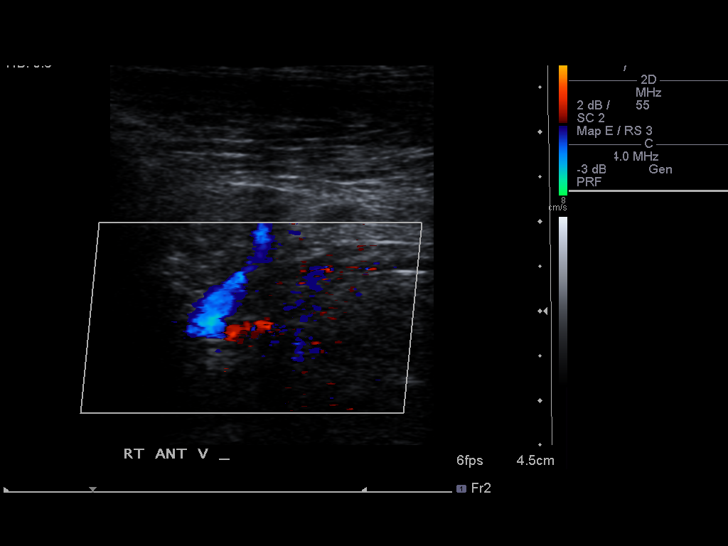

[14 of 24 positions shown; findings below may reference images not displayed]

FINDINGS: Deep venous system patent and compressible from right groin through
popliteal fossa.
Spontaneous venous flow present with intact augmentation and
evidence of respiratory phasicity.
No intraluminal thrombus identified.
Visualized portion of the greater saphenous system unremarkable.
IMPRESSION: No evidence of deep venous thrombosis in the right lower extremity.

## 2011-10-23 ENCOUNTER — Other Ambulatory Visit: Payer: Self-pay | Admitting: Family Medicine

## 2011-10-23 DIAGNOSIS — G40909 Epilepsy, unspecified, not intractable, without status epilepticus: Secondary | ICD-10-CM

## 2011-10-23 IMAGING — CT CT HEAD W/O CM
1 of 2 series · 16 of 30 positions shown, 20 images · non-contrast
Comparison: CT of the head 10/31/2009.

CLINICAL DATA: Right-sided pain.

CT HEAD WITHOUT CONTRAST
TECHNIQUE: Contiguous axial images were obtained from the base of
the skull through the vertex without contrast.

[Series 2: headseq 4.8 h37s · axial · 0.54mm/px · z∈[+152,+284]mm · 16 of 30 slices shown, 20 images]
[im 2/30  brain]
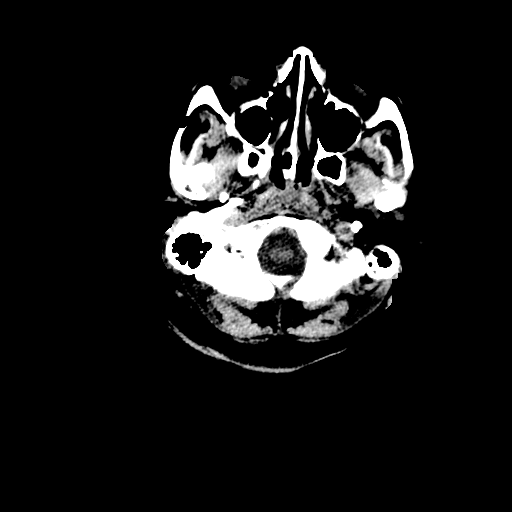
[im 2/30  bone]
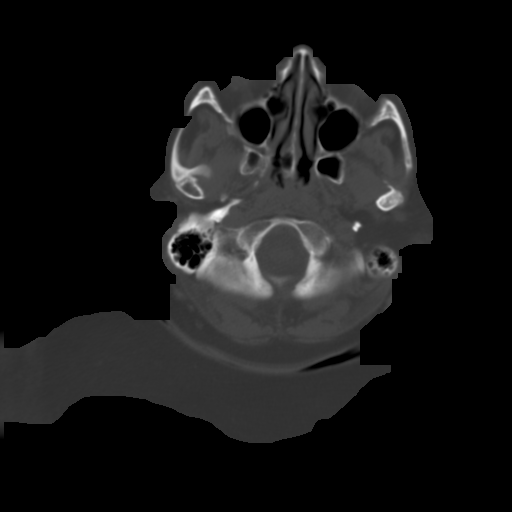
[im 4/30  brain]
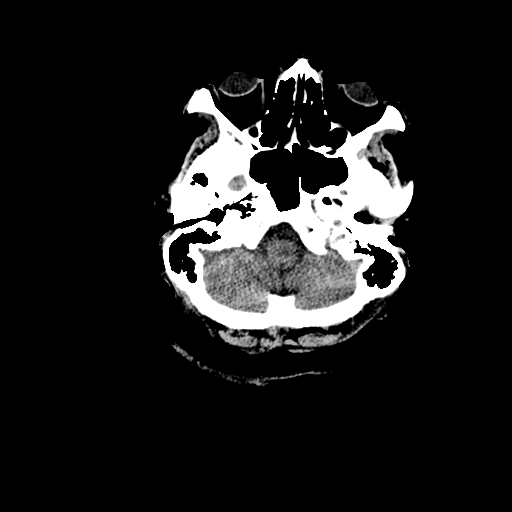
[im 5/30  brain]
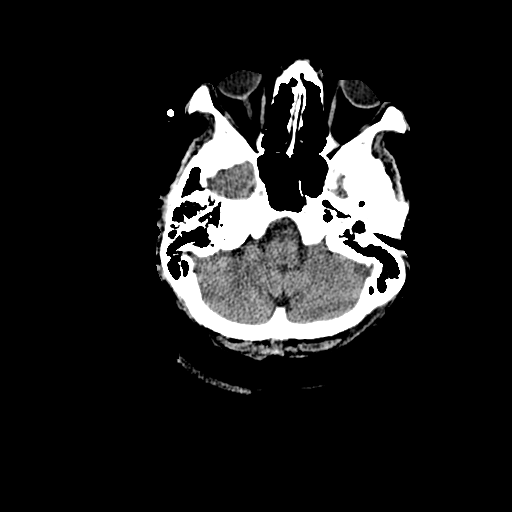
[im 8/30  brain]
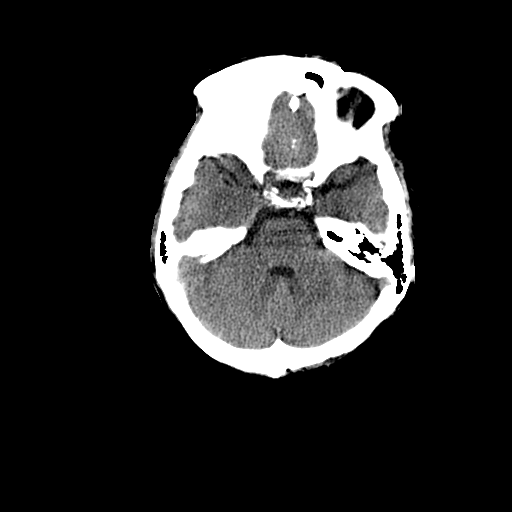
[im 9/30  brain]
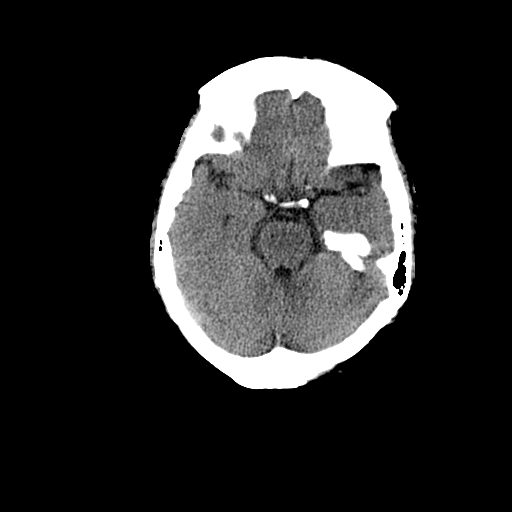
[im 9/30  bone]
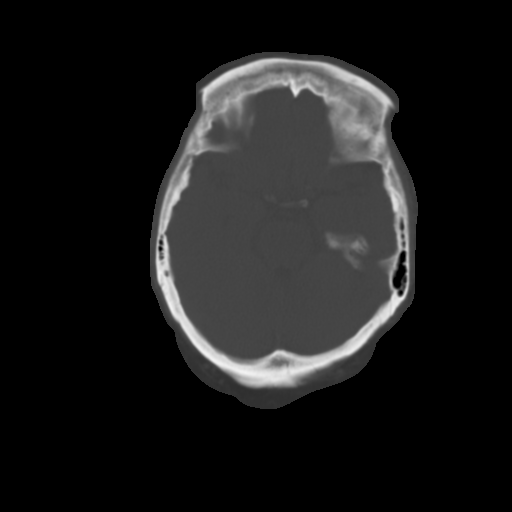
[im 10/30  brain]
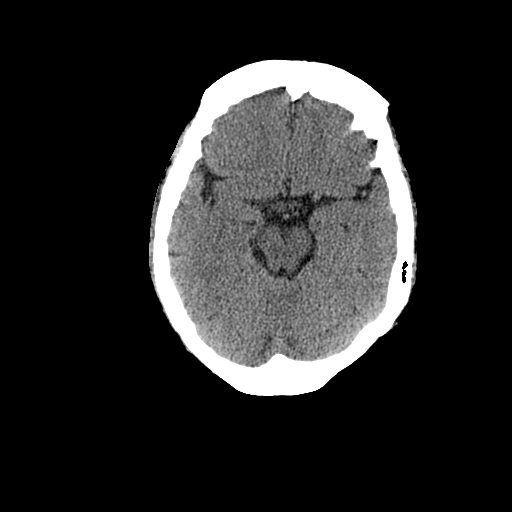
[im 13/30  brain]
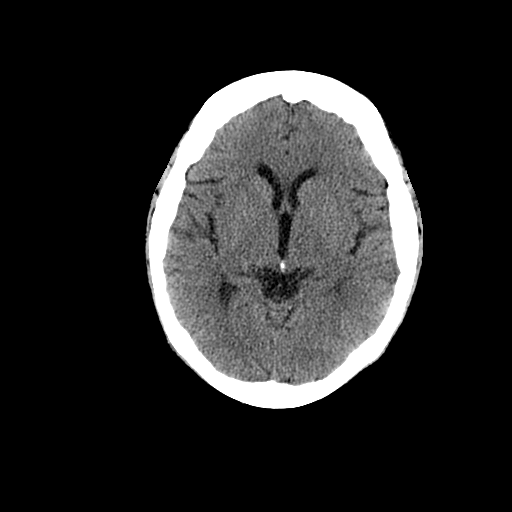
[im 14/30  brain]
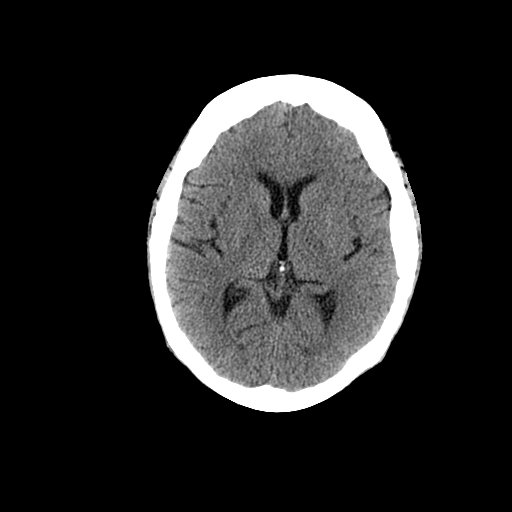
[im 16/30  brain]
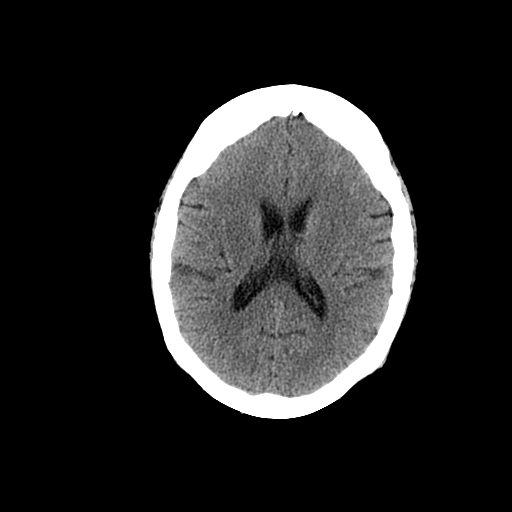
[im 16/30  bone]
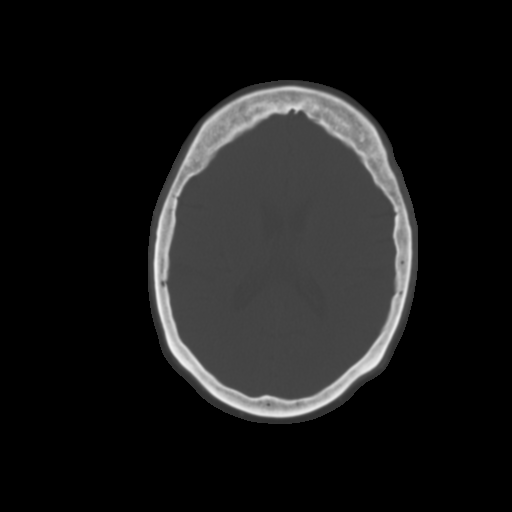
[im 17/30  brain]
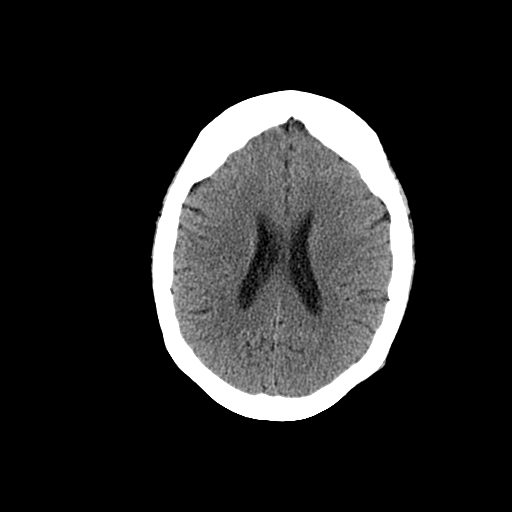
[im 20/30  brain]
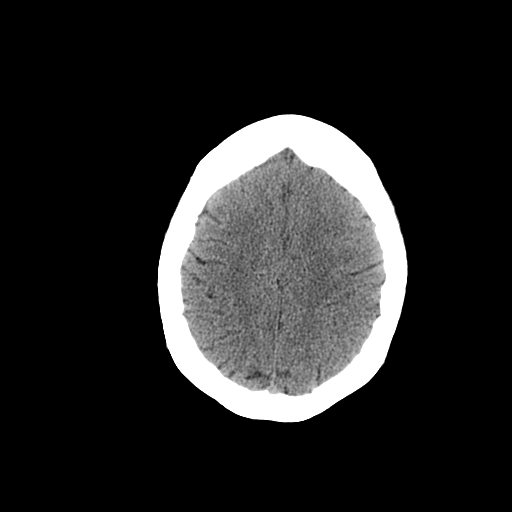
[im 21/30  brain]
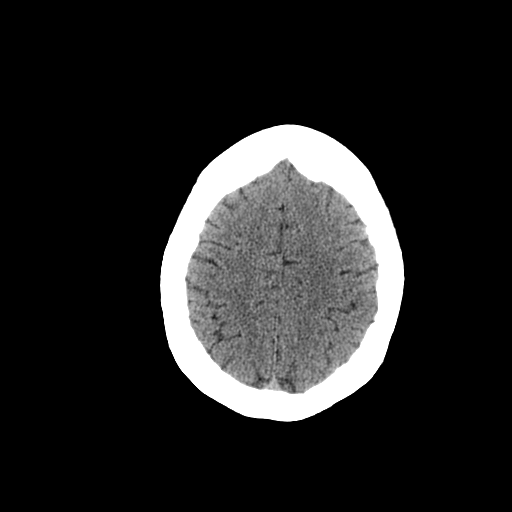
[im 22/30  brain]
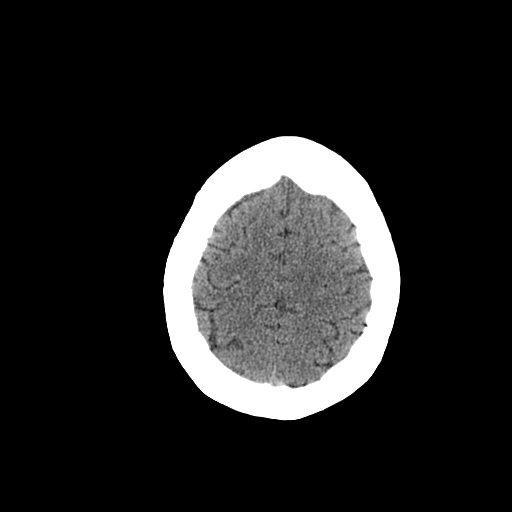
[im 22/30  bone]
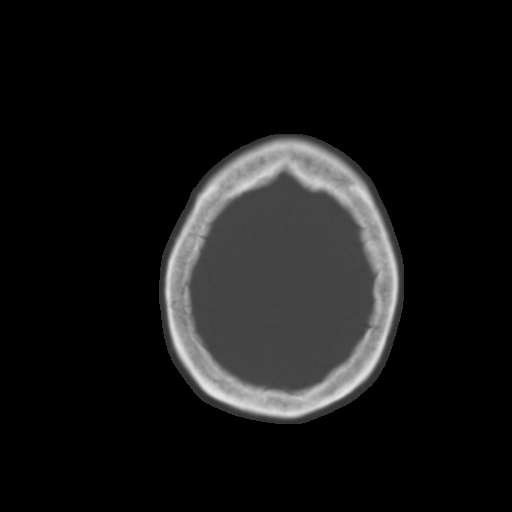
[im 25/30  brain]
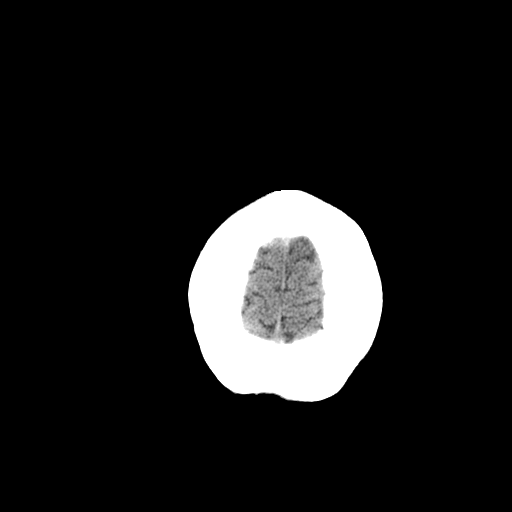
[im 26/30  brain]
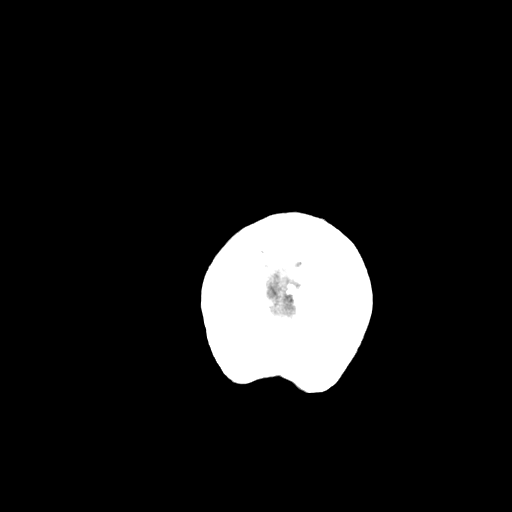
[im 28/30  brain]
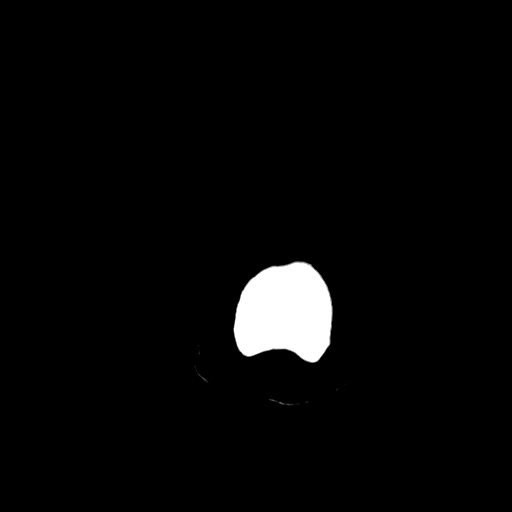

[16 of 30 positions shown; findings below may reference images not displayed]

FINDINGS: No acute cortical infarct, hemorrhage, or mass lesion is
present.  The paranasal sinuses and mastoid air cells are clear.
Minimal vascular calcifications are noted within the cavernous
carotid arteries bilaterally.
IMPRESSION: 1.  No acute intracranial abnormality or significant interval
change.
2.  Atherosclerosis.

## 2011-10-23 IMAGING — CT CT ABD-PELV W/ CM
2 of 5 series · 17 of 46 positions shown, 19 images · IV contrast (Omnipaque 300)
Comparison: 07/16/2008.

CLINICAL DATA: Right-sided abdominal pain.

CT ABDOMEN AND PELVIS WITH CONTRAST
TECHNIQUE: Multidetector CT imaging of the abdomen and pelvis was
performed following the standard protocol during bolus
administration of intravenous contrast.
Contrast: 100 ml Fmnipaque-CCC.

[Series 2: abd_pel_with 5.0 b40f · axial · 0.84mm/px · z∈[+232,+632]mm · 14 of 92 slices shown, 16 images]
[im 6/92  soft-tissue]
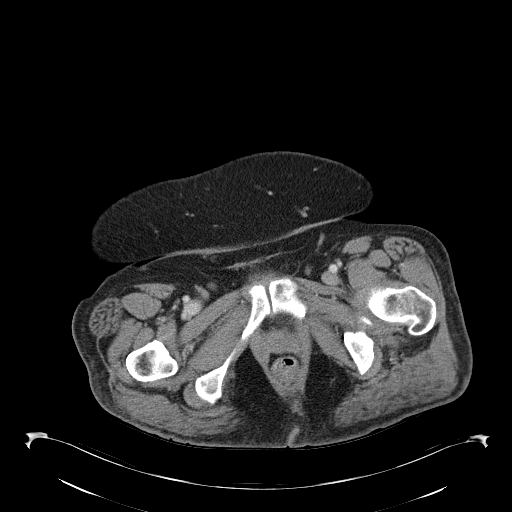
[im 6/92  bone]
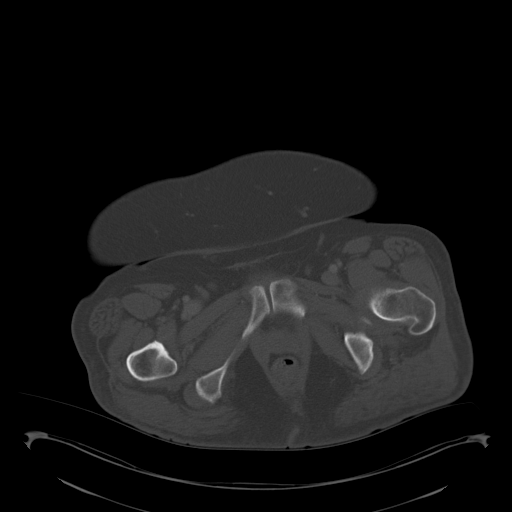
[im 12/92  soft-tissue]
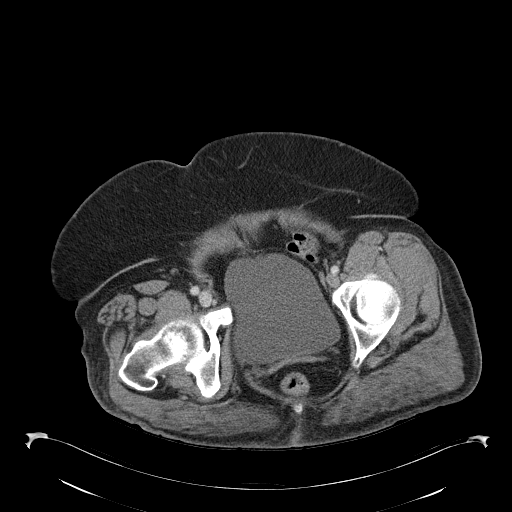
[im 18/92  soft-tissue]
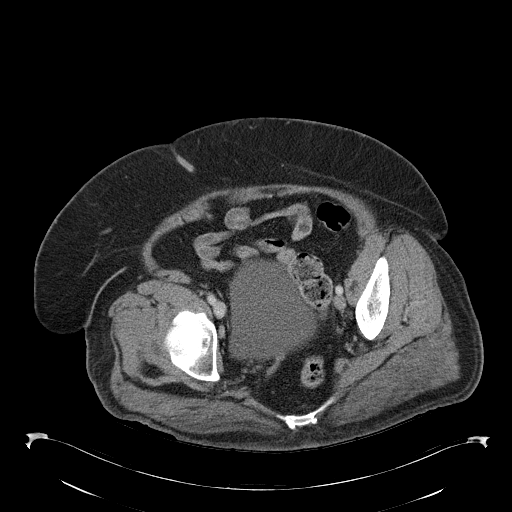
[im 23/92  soft-tissue]
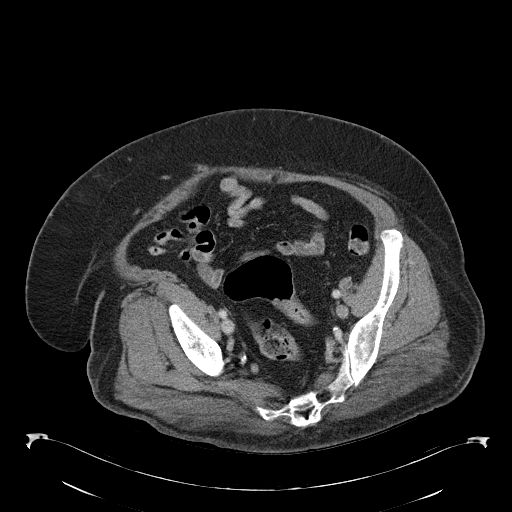
[im 29/92  soft-tissue]
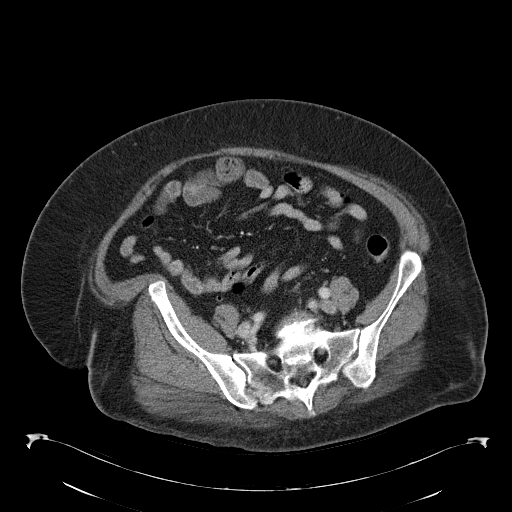
[im 35/92  soft-tissue]
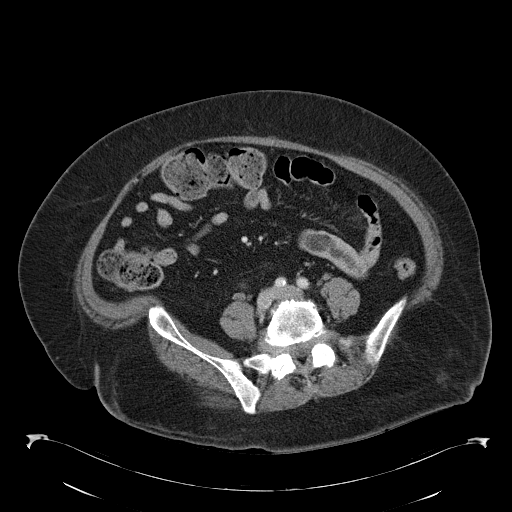
[im 40/92  soft-tissue]
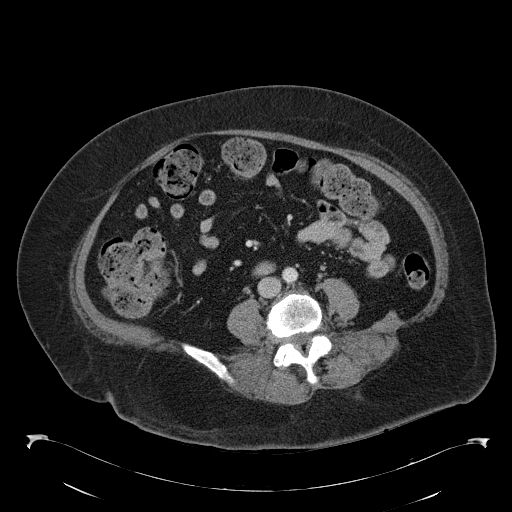
[im 52/92  soft-tissue]
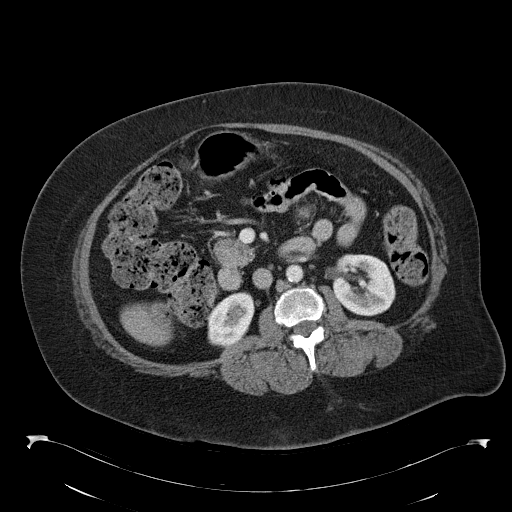
[im 57/92  soft-tissue]
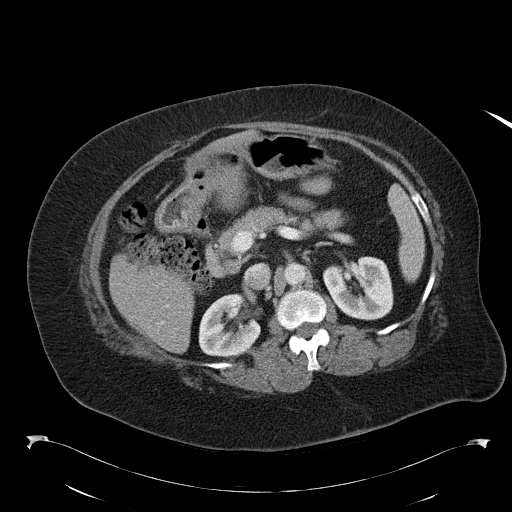
[im 57/92  bone]
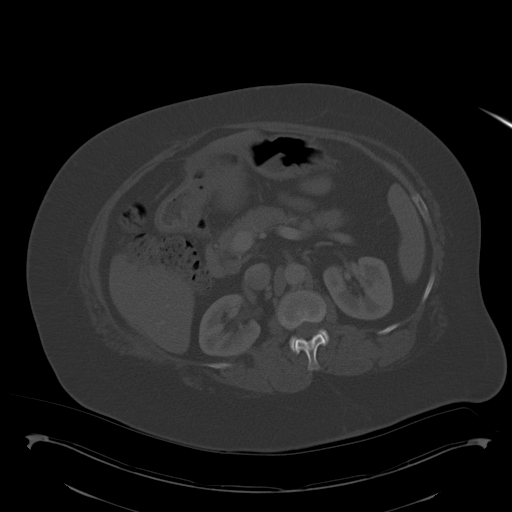
[im 63/92  soft-tissue]
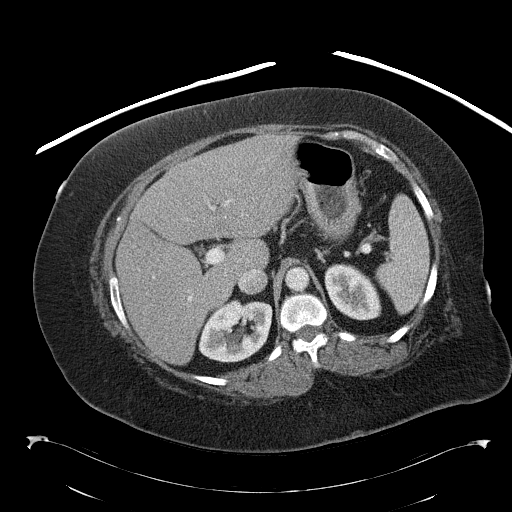
[im 69/92  soft-tissue]
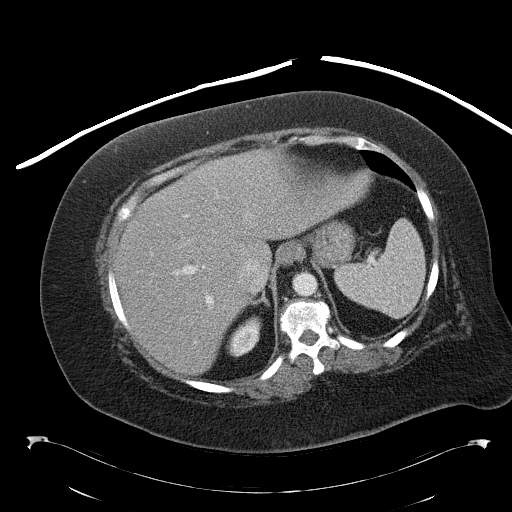
[im 74/92  soft-tissue]
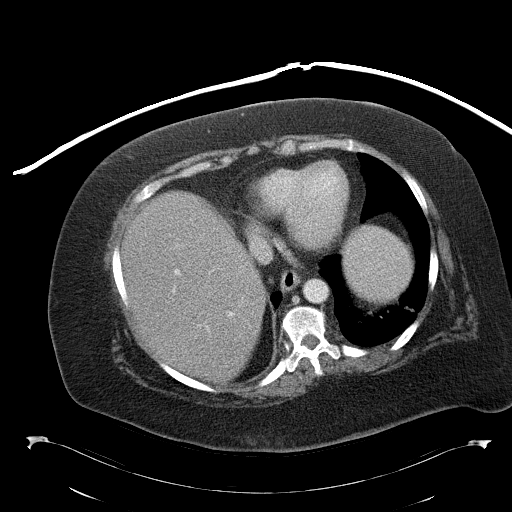
[im 80/92  soft-tissue]
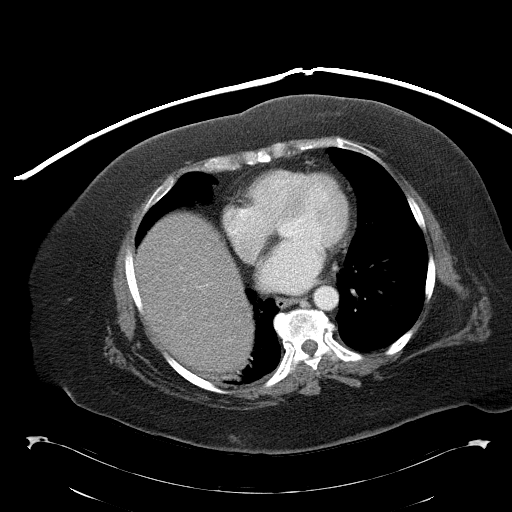
[im 86/92  soft-tissue]
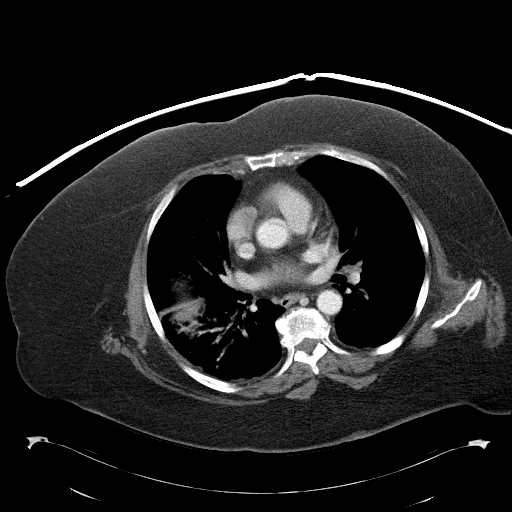

[Series 5: abd_pel_with 3.0 spo cor · coronal · 0.88mm/px · 3 of 94 slices shown]
[im 32/94  soft-tissue]
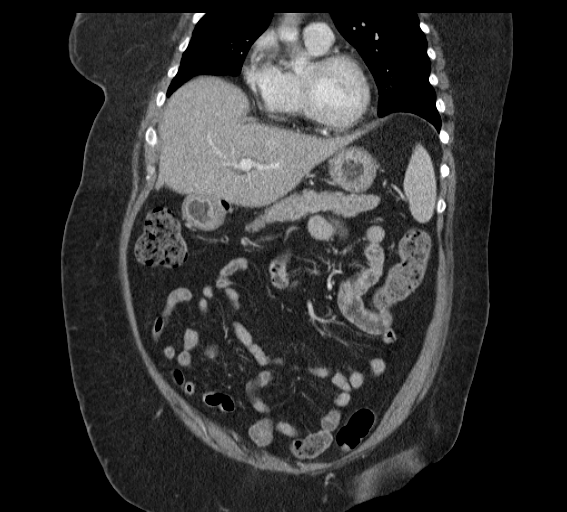
[im 42/94  soft-tissue]
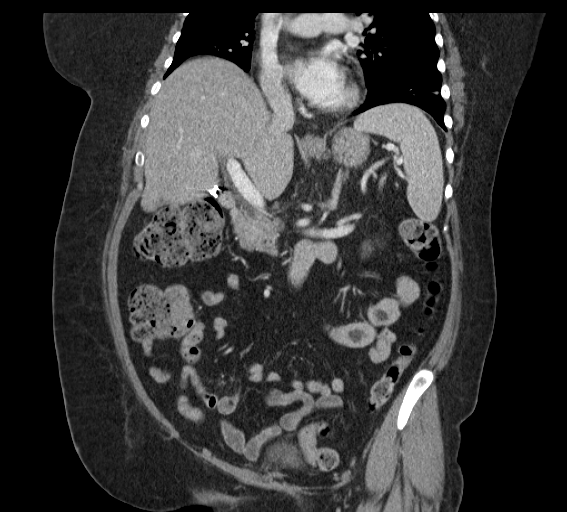
[im 52/94  soft-tissue]
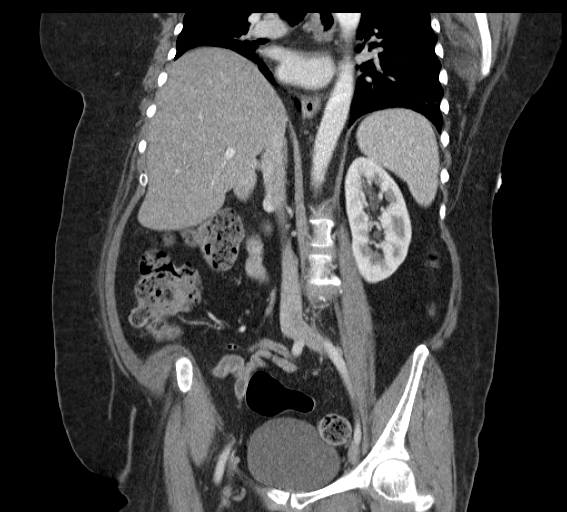

[17 of 46 positions shown; findings below may reference images not displayed]

FINDINGS: Basilar parenchymal changes, which represents scarring.
Additionally, mild infiltrate right base may be present.  Left base
4.7 mm nodule unchanged.

No extraluminal bowel inflammatory process.  Specifically, no
inflammation of the appendix.

Mild coronary artery calcifications.  Heart size within normal
limits.  Mild atherosclerotic type changes lower abdominal aorta
and common iliac arteries.  No aneurysmal dilation of the aorta.

Post cholecystectomy.  Fatty infiltration of the liver.  1.1 cm
hypodensity inferior aspect right lobe liver unchanged.  No focal
splenic, pancreatic, adrenal or renal lesion.  Urinary bladder
unremarkable.

Degenerative changes  thoracic lumbar spine without bony
destructive lesion.
IMPRESSION: Basilar parenchymal changes, which represents scarring.
Additionally, mild infiltrate right base may be present.  Left base
4.7 mm nodule unchanged.

No bowel inflammatory process.

Mild atherosclerotic type changes.

## 2011-10-23 IMAGING — CR DG CHEST 1V
1 series · 1 of 1 positions shown · non-contrast
Comparison: Portable chest x-ray of 12/18/2009 and CT chest of
03/29/2010

CLINICAL DATA: Altered level of consciousness, right-sided pain

CHEST - 1 VIEW

[view not recorded]
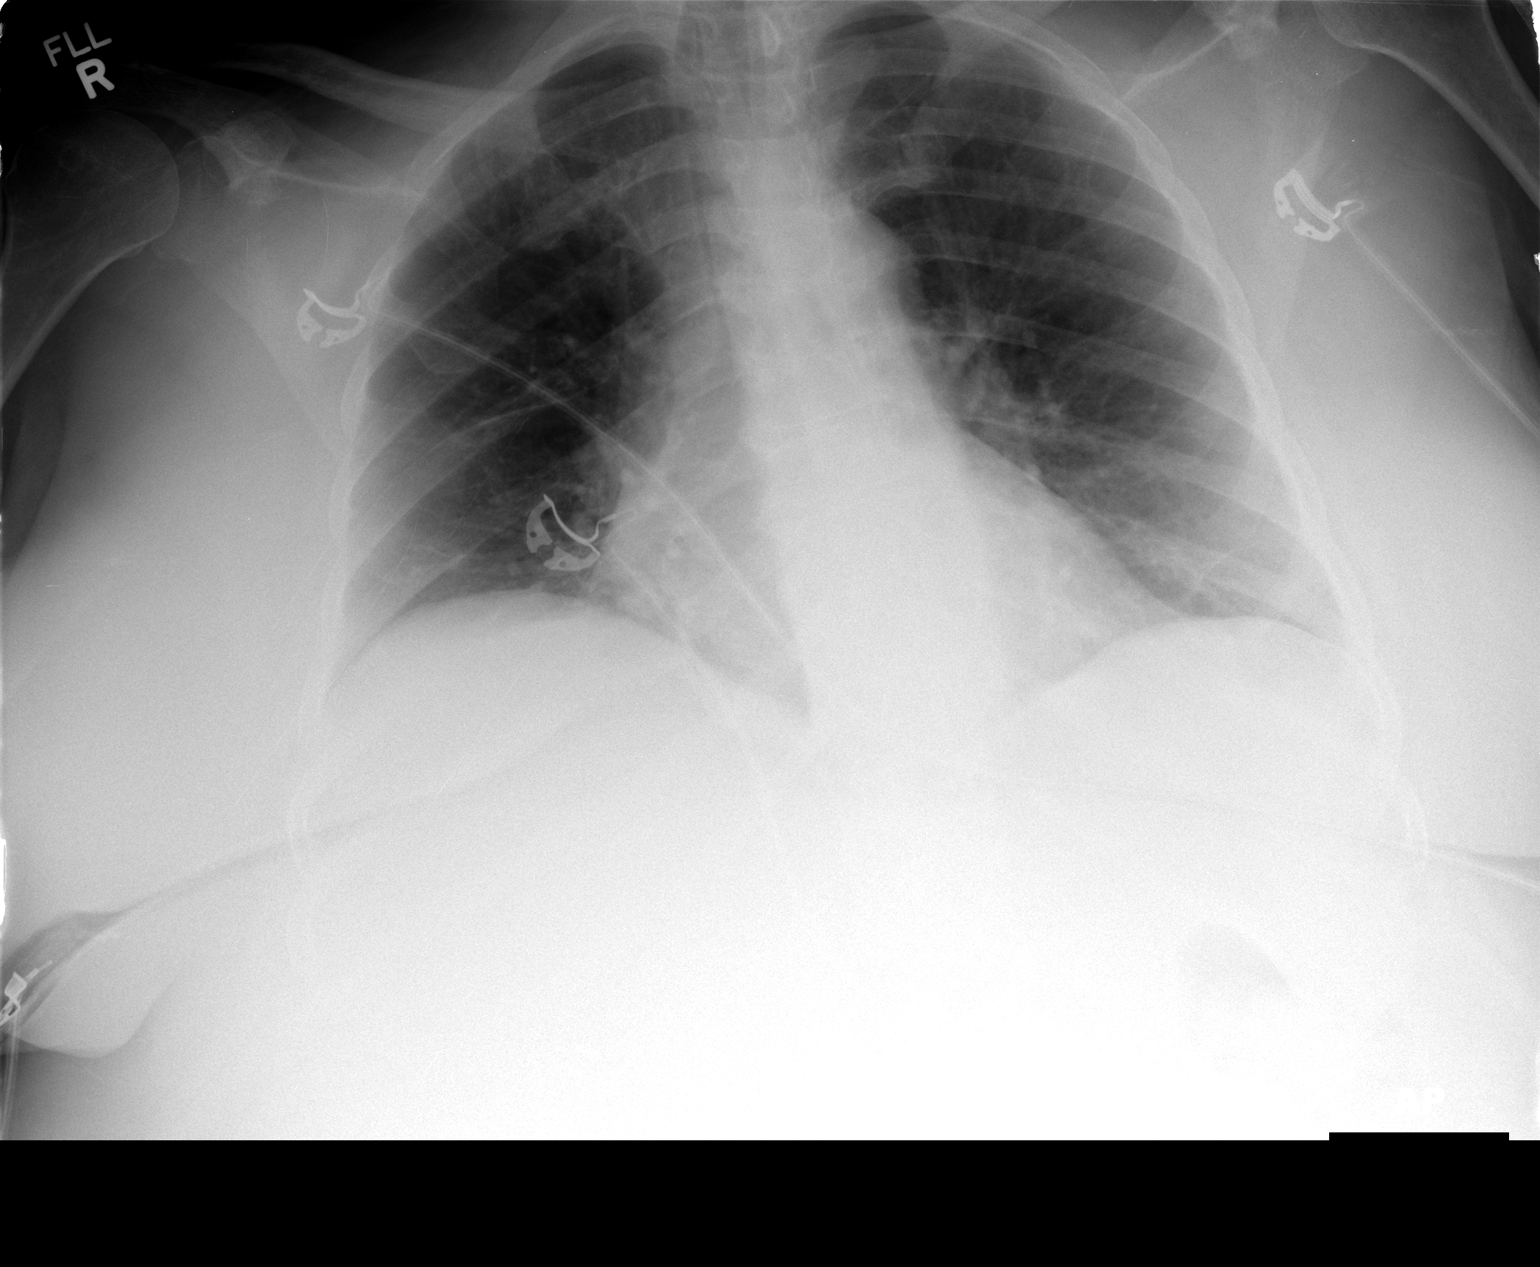

[1 of 1 positions shown; findings below may reference images not displayed]

FINDINGS: The lungs are not optimally aerated but no active
infiltrate or effusion is seen.  Mediastinal contours are stable.
The heart is within upper limits of normal.  No bony abnormality is
seen.
IMPRESSION: No active lung disease.

## 2011-10-23 NOTE — Telephone Encounter (Signed)
pls refer pt to neurologist in [place] for eEG

## 2011-10-23 NOTE — Telephone Encounter (Signed)
Pt was referred to [facility], they will call her with appt and time.

## 2011-10-24 NOTE — Telephone Encounter (Signed)
She is changing neurology. She is aware that she needs to follow up with him before refills

## 2011-10-24 NOTE — Telephone Encounter (Signed)
Pt was referred to [facility] and also told that they would call her with appt and time

## 2011-10-25 ENCOUNTER — Ambulatory Visit (INDEPENDENT_AMBULATORY_CARE_PROVIDER_SITE_OTHER): Payer: Medicare Other | Admitting: Orthopedic Surgery

## 2011-10-25 ENCOUNTER — Ambulatory Visit (INDEPENDENT_AMBULATORY_CARE_PROVIDER_SITE_OTHER): Payer: Medicare Other

## 2011-10-25 ENCOUNTER — Encounter: Payer: Self-pay | Admitting: Orthopedic Surgery

## 2011-10-25 VITALS — BP 110/60 | Ht 62.0 in | Wt 152.0 lb

## 2011-10-25 DIAGNOSIS — M25562 Pain in left knee: Secondary | ICD-10-CM

## 2011-10-25 DIAGNOSIS — M25569 Pain in unspecified knee: Secondary | ICD-10-CM

## 2011-10-25 DIAGNOSIS — G5790 Unspecified mononeuropathy of unspecified lower limb: Secondary | ICD-10-CM

## 2011-10-25 NOTE — Progress Notes (Signed)
  Subjective:    Patient ID: [name], female    DOB: 02/27/1951, 61 y.o.   MRN: [id number]  HPI Comments: Chief complaint is posterior left knee pain  This patient had previous back surgery with Dr. Shon Baton in [place] presents with a two-month history of sudden onset of posterior left knee pain with no history of trauma. She complains of a dull throbbing sharp sensation in the back of her left knee. She went to the emergency room and she had strokelike symptoms had an ultrasound which was normal. X-rays today show no significant knee disease. Her symptoms seemed to come . She seems to have increased symptoms with standing or sitting for long periods of time this is associated with numbness and tingling in the left lower extremity.    Knee Pain       Review of Systems  Constitutional:       Complains unexpected weight loss, fatigue blurred vision double vision heart murmur shortness of breath wheezing tightness in the chest snoring heartburn nausea vomiting constipation diarrhea  Complains of numbness tingling unsteady gait dizziness tremors seizures nervousness anxiety depression easy bleeding easy bruising excessive thirst excessive urination adverse food reaction seasonal allergies but denies frequency urgency.  Does complain that her skin itches.       Objective:   Physical Exam  Constitutional: She is oriented to person, place, and time. She appears well-developed and well-nourished.  Cardiovascular: Intact distal pulses.   Pulmonary/Chest: Effort normal.  Abdominal: She exhibits no distension.  Neurological: She is alert and oriented to person, place, and time. She has normal reflexes. She exhibits normal muscle tone.  Skin: Skin is warm and dry. No rash noted. No erythema.  Psychiatric: Her behavior is normal. Judgment and thought content normal.       Flat affect  Right Knee Exam   Tenderness  The patient is experiencing no tenderness.     Range of Motion  The  patient has normal right knee ROM.  Muscle Strength   The patient has normal right knee strength.  Tests  McMurray:  Medial - negative  Drawer:       Anterior - negative    Posterior - negative Patellar Apprehension: negative  Other  Sensation: normal Pulse: present Swelling: none   Left Knee Exam   Tenderness  The patient is experiencing no tenderness.     Range of Motion  The patient has normal left knee ROM.  Muscle Strength   The patient has normal left knee strength.  Tests  McMurray:  Medial - negative Lateral - negative Lachman:  Anterior - negative     Drawer:       Anterior - negative     Posterior - negative Varus: negative Valgus: negative Patellar Apprehension: negative  Other  Erythema: absent Scars: absent Sensation: normal Pulse: present Swelling: none       Fairly normal knee mild medial joint space narrowing.        Assessment & Plan:  Posterior knee pain most likely not joint related  Recommend increase Lyrica to twice a day if no improvement please see neurologist and/or neurosurgeon for further workup

## 2011-10-25 NOTE — Patient Instructions (Signed)
Increase Lyrica to twice a day and see Dr Shon Baton if symptoms continue

## 2011-10-26 ENCOUNTER — Other Ambulatory Visit: Payer: Self-pay | Admitting: *Deleted

## 2011-10-26 ENCOUNTER — Telehealth: Payer: Self-pay | Admitting: Internal Medicine

## 2011-10-26 MED ORDER — PREGABALIN 75 MG PO CAPS: 75.0000 mg | ORAL_CAPSULE | Freq: Two times a day (BID) | ORAL | Status: DC

## 2011-10-26 NOTE — Telephone Encounter (Signed)
I also called Dr. Anthony Sar office to see if they had tried to call the pt. Spoke with Carlisle Beers, she asked the office staff and no one had called her recently.

## 2011-10-26 NOTE — Telephone Encounter (Signed)
Pt called this morning to say she had a call from someone here and was asleep and does not remember what they told her. She thinks is was about having blood work done. She also wanted to know if she could get all her labs done at one time between [name] and her other doctors. I told her I would have nurse call her back

## 2011-10-26 NOTE — Telephone Encounter (Signed)
Called pt- informed her that I have not tried to call her since May 2013. Checked chart and cannot find anywhere in chart that our office has been trying to call. Informed her that if I found out anything I would call and let her know.

## 2011-10-29 IMAGING — US US ABDOMEN COMPLETE
1 series · 13 of 25 positions shown · non-contrast
Comparison: 05/02/2006

CLINICAL DATA: Right upper quadrant pain

ULTRASOUND ABDOMEN:
TECHNIQUE: Sonography of upper abdominal structures was performed.
Image quality degraded by patient body habitus and patient
inability to suspend respiration due to COPD.

[Series 1: us abdomen complete · 0.28mm/px · 13 of 53 slices shown]
[im 1/53]
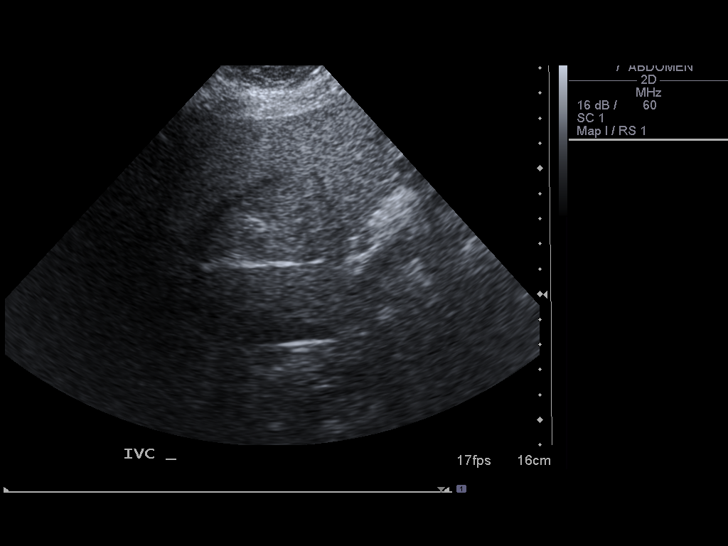
[im 5/53]
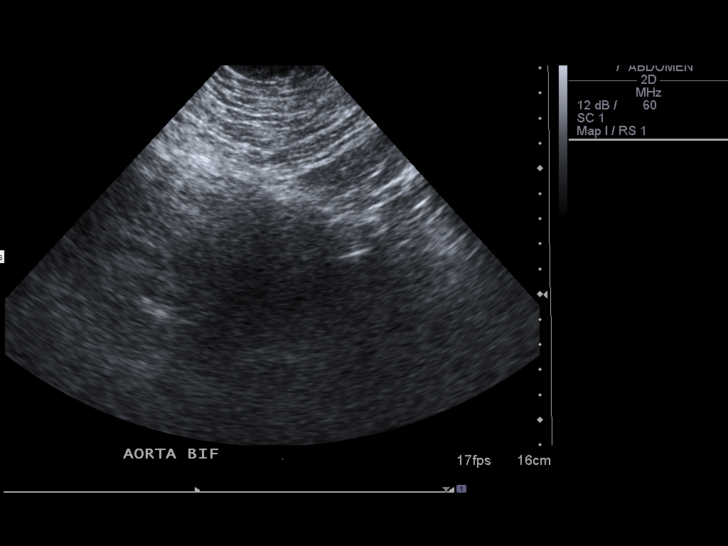
[im 9/53]
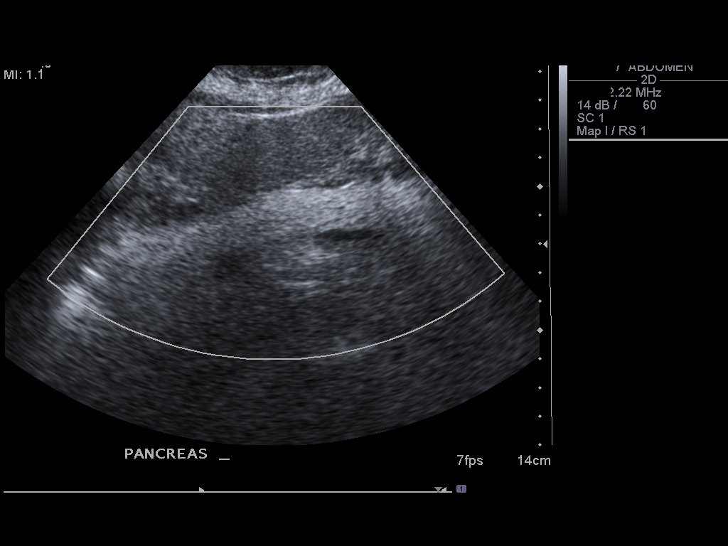
[im 14/53]
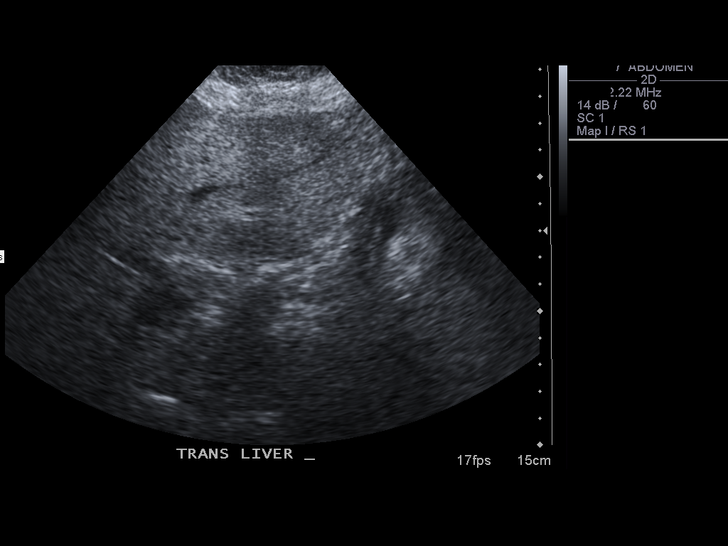
[im 18/53]
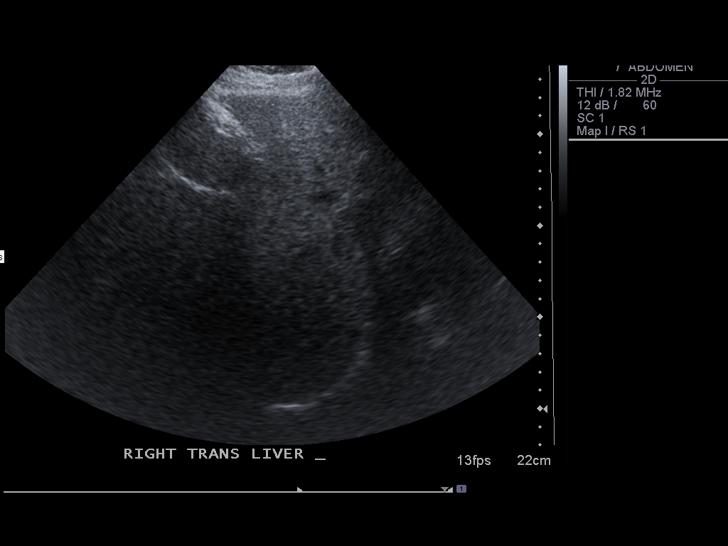
[im 22/53]
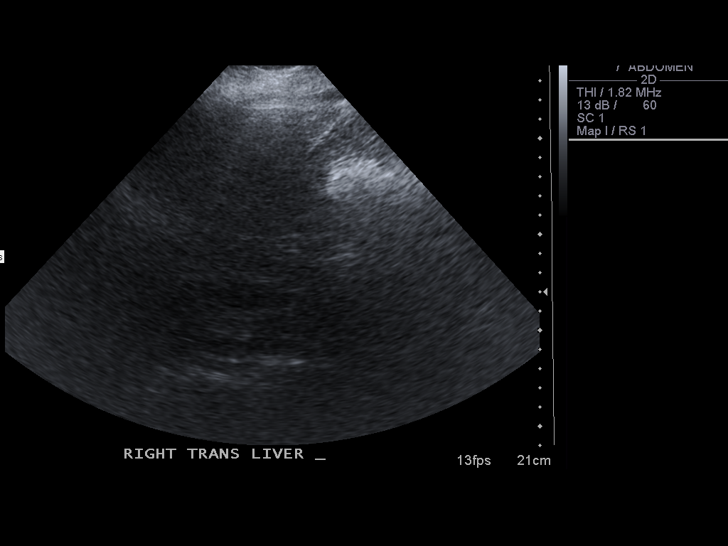
[im 27/53]
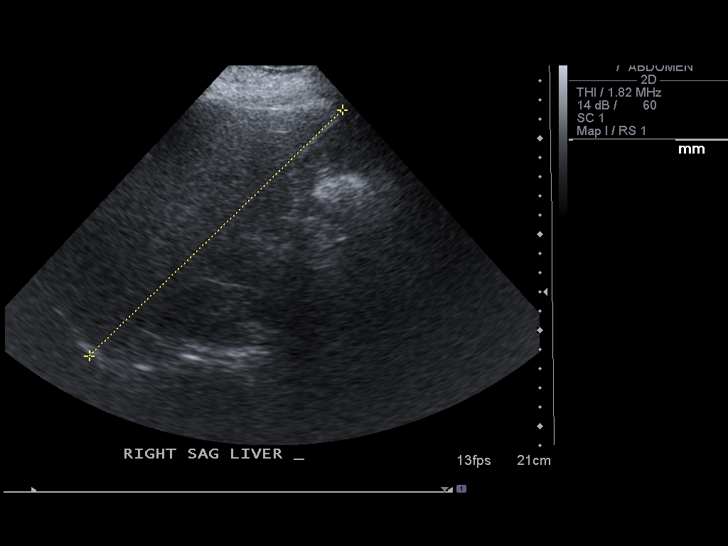
[im 31/53]
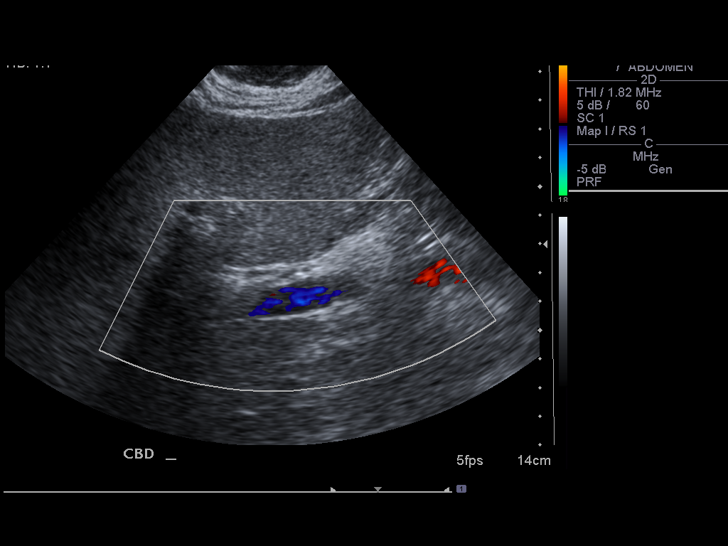
[im 35/53]
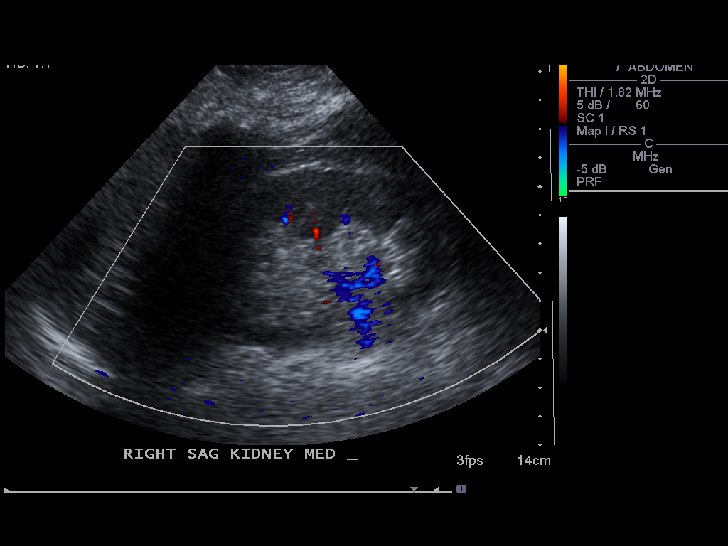
[im 40/53]
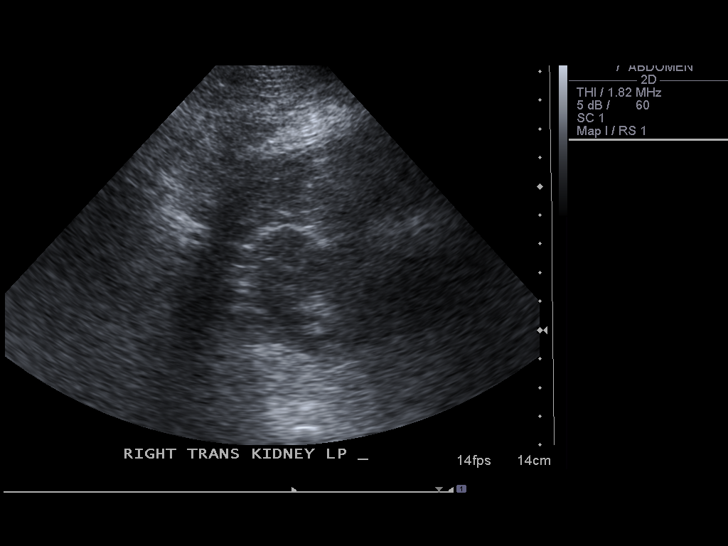
[im 44/53]
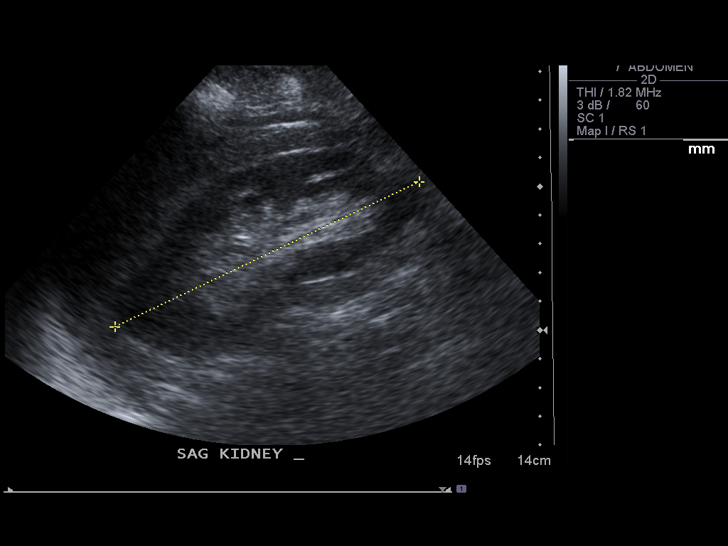
[im 48/53]
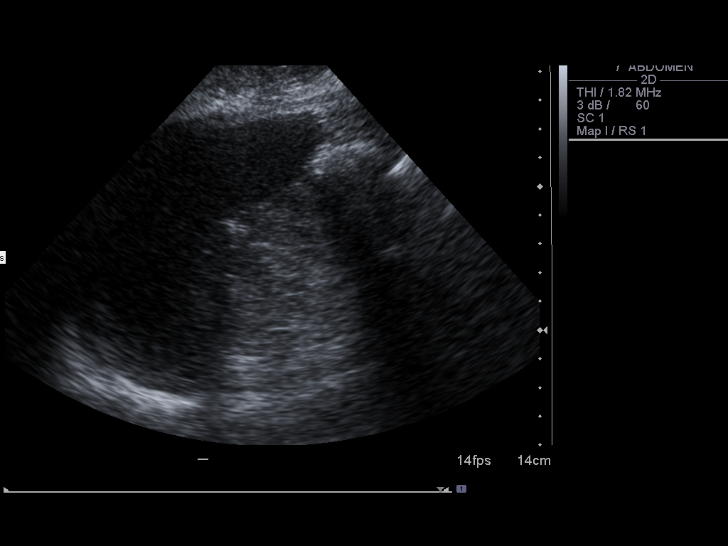
[im 53/53]
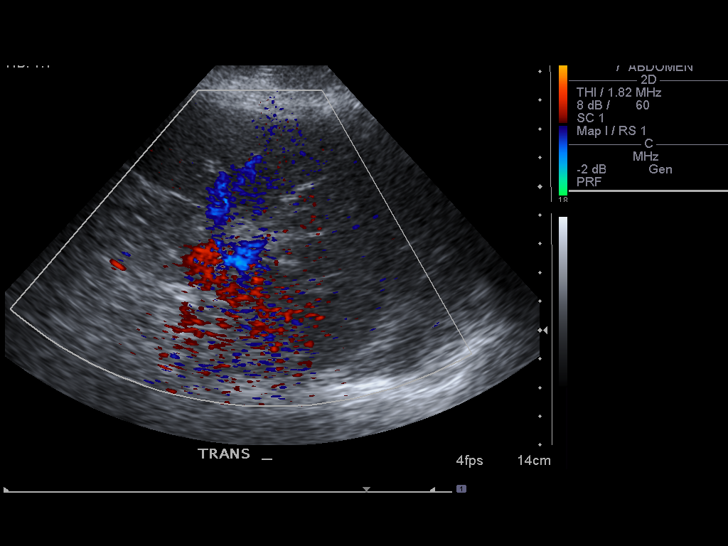

[13 of 25 positions shown; findings below may reference images not displayed]

Gallbladder:  Surgically absent

Common bile duct:  Normal caliber 2 mm diameter.

Liver:  Incompletely visualized.  Visualized portions appear
echogenic, question fatty infiltration, though this is nonspecific
and can also be seen with cirrhosis and certain infiltrative
disorders.  Within the visualized portions of liver, no gross mass
or intrahepatic biliary dilatation identified.

IVC:  Unremarkable

Pancreas:  Small portion of the pancreatic body normal appearance
with remainder obscured by bowel gas.

Spleen:  5.2 cm length.  Grossly normal morphology without mass.

Right kidney:  11.2 cm length.  Minimal cortical thinning.  Normal
cortical echogenicity.  No gross mass or hydronephrosis.

Left kidney:  11.7 cm length.  Minimal cortical thinning.  Normal
cortical echogenicity.  No gross mass or hydronephrosis.

Aorta:  Inadequately visualized due to bowel gas.

Other:  No free fluid
IMPRESSION: Post cholecystectomy.
Question fatty infiltration liver as above.
Incomplete visualization of liver, pancreas and aorta as above.
These structures were better visualized on the recent abdominal CT
of 03/30/2010, please refer to that exam.

## 2011-10-30 ENCOUNTER — Telehealth: Payer: Self-pay | Admitting: Family Medicine

## 2011-10-30 NOTE — Telephone Encounter (Signed)
Patient aware and lab order faxed  

## 2011-10-31 ENCOUNTER — Telehealth: Payer: Self-pay | Admitting: Family Medicine

## 2011-10-31 ENCOUNTER — Other Ambulatory Visit: Payer: Self-pay | Admitting: Neurosurgery

## 2011-10-31 LAB — COMPLETE METABOLIC PANEL WITH GFR
AST: 14 U/L (ref 0–37)
Albumin: 3.7 g/dL (ref 3.5–5.2)
Alkaline Phosphatase: 110 U/L (ref 39–117)
BUN: 21 mg/dL (ref 6–23)
Calcium: 9.2 mg/dL (ref 8.4–10.5)
Chloride: 102 mEq/L (ref 96–112)
Glucose, Bld: 261 mg/dL — ABNORMAL HIGH (ref 70–99)
Potassium: 3.7 mEq/L (ref 3.5–5.3)
Sodium: 139 mEq/L (ref 135–145)
Total Protein: 6.4 g/dL (ref 6.0–8.3)

## 2011-10-31 LAB — HEMOGLOBIN A1C: Hgb A1c MFr Bld: 6.7 % — ABNORMAL HIGH (ref <5.7)

## 2011-10-31 LAB — LIPID PANEL
Cholesterol: 152 mg/dL (ref 0–200)
Total CHOL/HDL Ratio: 2.5 Ratio
VLDL: 37 mg/dL (ref 0–40)

## 2011-11-01 NOTE — Telephone Encounter (Signed)
Sent request to Dr. Earl Gala office for plavix hold orders.

## 2011-11-03 NOTE — Telephone Encounter (Signed)
Patient is aware 

## 2011-11-07 ENCOUNTER — Telehealth: Payer: Self-pay | Admitting: Family Medicine

## 2011-11-07 DIAGNOSIS — E119 Type 2 diabetes mellitus without complications: Secondary | ICD-10-CM

## 2011-11-07 DIAGNOSIS — M899 Disorder of bone, unspecified: Secondary | ICD-10-CM

## 2011-11-08 MED ORDER — ERGOCALCIFEROL 1.25 MG (50000 UT) PO CAPS: 50000.0000 [IU] | ORAL_CAPSULE | ORAL | Status: DC

## 2011-11-08 NOTE — Telephone Encounter (Signed)
Called patient and left message for them to return call at the office   

## 2011-11-08 NOTE — Telephone Encounter (Signed)
Needs updated vit D refill ex 3 months only, needs HBA1C and vit D 3  In 3 months  She is seeing a new neurologist who should prescribe th medication, pls explain, if she has not seen the doc yet find out when she will see the new neurologist and refill for long enough to cover the time till she sees the neurologist please at current dose

## 2011-11-08 NOTE — Telephone Encounter (Signed)
Wants to know if you can start prescribing her lamictal that she had been getting from [place]. Also is requesting a refill on her vit d 50000. Ok to refill?

## 2011-11-09 ENCOUNTER — Other Ambulatory Visit: Payer: Self-pay

## 2011-11-09 MED ORDER — LAMOTRIGINE 100 MG PO TABS: 100.0000 mg | ORAL_TABLET | ORAL | Status: DC

## 2011-11-09 NOTE — Telephone Encounter (Signed)
Med refilled.

## 2011-11-09 NOTE — Telephone Encounter (Signed)
pls let me know the status of the neurology appt requested, pt states has no appt, I see visit was authorized. Pls notify pt of appt information also

## 2011-11-09 NOTE — Telephone Encounter (Signed)
Pt states that she is yet to receive an appt with the neurologist and that she would still like a refill.  Please advise.  She also stated that she would get the order for labs when she comes in next month.

## 2011-11-09 NOTE — Telephone Encounter (Signed)
pls refill lamictal at current dose x 2 months and let her know

## 2011-11-12 HISTORY — PX: BRAIN SURGERY: SHX531

## 2011-11-13 ENCOUNTER — Other Ambulatory Visit: Payer: Self-pay | Admitting: Family Medicine

## 2011-11-16 ENCOUNTER — Ambulatory Visit (INDEPENDENT_AMBULATORY_CARE_PROVIDER_SITE_OTHER): Payer: Medicare Other | Admitting: Family Medicine

## 2011-11-16 ENCOUNTER — Other Ambulatory Visit: Payer: Self-pay

## 2011-11-16 ENCOUNTER — Encounter: Payer: Self-pay | Admitting: Family Medicine

## 2011-11-16 VITALS — BP 138/72 | HR 96 | Resp 16 | Ht 62.0 in | Wt 152.0 lb

## 2011-11-16 DIAGNOSIS — K0510 Chronic gingivitis, plaque induced: Secondary | ICD-10-CM | POA: Insufficient documentation

## 2011-11-16 DIAGNOSIS — I10 Essential (primary) hypertension: Secondary | ICD-10-CM

## 2011-11-16 DIAGNOSIS — F060 Psychotic disorder with hallucinations due to known physiological condition: Secondary | ICD-10-CM

## 2011-11-16 DIAGNOSIS — E119 Type 2 diabetes mellitus without complications: Secondary | ICD-10-CM

## 2011-11-16 DIAGNOSIS — E785 Hyperlipidemia, unspecified: Secondary | ICD-10-CM

## 2011-11-16 DIAGNOSIS — D329 Benign neoplasm of meninges, unspecified: Secondary | ICD-10-CM

## 2011-11-16 DIAGNOSIS — Z23 Encounter for immunization: Secondary | ICD-10-CM

## 2011-11-16 DIAGNOSIS — D320 Benign neoplasm of cerebral meninges: Secondary | ICD-10-CM

## 2011-11-16 DIAGNOSIS — R51 Headache: Secondary | ICD-10-CM

## 2011-11-16 MED ORDER — CLINDAMYCIN HCL 300 MG PO CAPS: 300.0000 mg | ORAL_CAPSULE | Freq: Three times a day (TID) | ORAL | Status: DC

## 2011-11-16 NOTE — Patient Instructions (Addendum)
F/u end December.  HBa1C, and chem 7 in December.  All the best with your surgery.  Cleocin is being prescribed for 5 days for gum irritation.  We will continue to find a neurologist for you  Flu vaccine today

## 2011-11-19 ENCOUNTER — Encounter: Payer: Self-pay | Admitting: Family Medicine

## 2011-11-19 DIAGNOSIS — D329 Benign neoplasm of meninges, unspecified: Secondary | ICD-10-CM | POA: Insufficient documentation

## 2011-11-19 NOTE — Assessment & Plan Note (Signed)
Upcoming surgery in the next 1 week planned

## 2011-11-19 NOTE — Assessment & Plan Note (Signed)
Controlled, no change in medication Patient advised to reduce carb and sweets, commit to regular physical activity, take meds as prescribed, test blood as directed, and attempt to lose weight, to improve blood sugar control.  

## 2011-11-19 NOTE — Progress Notes (Signed)
  Subjective:    Patient ID: [name], female    DOB: 08-31-1950, 61 y.o.   MRN: [id number]  HPI The PT is here for follow up and re-evaluation of chronic medical conditions, medication management and review of any available recent lab and radiology data.  Preventive health is updated, specifically  Cancer screening and Immunization.   Questions or concerns regarding consultations or procedures which the PT has had in the interim are  Addressed.Last month presented to Ed with acute unilateral weakness, due to cerebral edema and increased size of meningioma The PT denies any adverse reactions to current medications since the last visit.  C/o gum pain, swelling and redness, dntures do not fit well, and she uses an excessive amt of paste daily Denies symptoms of hypo or hyperglycemia, tests blood sugars regularly and generally within range    Review of Systems See HPI Denies recent fever or chills. Denies sinus pressure, nasal congestion, ear pain or sore throat. Denies chest congestion, productive cough or wheezing. Denies chest pains, palpitations and leg swelling Denies abdominal pain, nausea, vomiting,diarrhea or constipation.   Denies dysuria, frequency, hesitancy or incontinence. C/o chronic  headaches, numbness, and  tingling. Chronic back and lowe extremity pain with mild limitation in mobility Denies uncontrolled  depression, anxiety or insomnia. Denies skin break down or rash.        Objective:   Physical Exam Patient alert and oriented and in no cardiopulmonary distress.  HEENT: No facial asymmetry, EOMI, no sinus tenderness,  oropharynx pink and moist.  Neck decreased ROM, no adenopathy.  Chest: Clear to auscultation bilaterally.  CVS: S1, S2 no murmurs, no S3.  ABD: Soft non tender. Bowel sounds normal.  Ext: No edema  MS:  Though reduced ROM spine, shoulders, hips and knees.  Skin: Intact, no ulcerations or rash noted.  Psych: Good eye contact, normal  affect. Memory intact not anxious or depressed appearing.  CNS: CN 2-12 intact, power, tone and sensation normal throughout.        Assessment & Plan:

## 2011-11-19 NOTE — Assessment & Plan Note (Addendum)
Triglycerides elevated, advised to lower fried and fatty food intake, no med change

## 2011-11-19 NOTE — Assessment & Plan Note (Signed)
Controlled, no change in medication DASH diet and commitment to daily physical activity for a minimum of 30 minutes discussed and encouraged, as a part of hypertension management. The importance of attaining a healthy weight is also discussed.  

## 2011-11-19 NOTE — Assessment & Plan Note (Signed)
Currently stable, needs to establish with a neurologist for ongoing care

## 2011-11-19 NOTE — Assessment & Plan Note (Signed)
Currently stable, continues to be followed by psychiatry

## 2011-11-19 NOTE — Assessment & Plan Note (Signed)
Tender gums with ill fitting dentures, cleocin prescribed

## 2011-11-20 ENCOUNTER — Encounter (HOSPITAL_COMMUNITY)
Admission: RE | Admit: 2011-11-20 | Discharge: 2011-11-20 | Disposition: A | Discharge status: Home / Self Care | Payer: Medicare Other | Source: Ambulatory Visit | Attending: Neurosurgery | Admitting: Neurosurgery

## 2011-11-20 ENCOUNTER — Encounter (HOSPITAL_COMMUNITY): Payer: Self-pay | Admitting: Pharmacy Technician

## 2011-11-20 ENCOUNTER — Encounter (HOSPITAL_COMMUNITY): Payer: Self-pay

## 2011-11-20 HISTORY — DX: Other specified symptoms and signs involving the digestive system and abdomen: R19.8

## 2011-11-20 HISTORY — DX: Unspecified osteoarthritis, unspecified site: M19.90

## 2011-11-20 LAB — GLUCOSE, CAPILLARY
Glucose-Capillary: 77 mg/dL (ref 70–99)
Glucose-Capillary: 158 mg/dL — ABNORMAL HIGH (ref 70–99)

## 2011-11-20 LAB — BASIC METABOLIC PANEL
BUN: 17 mg/dL (ref 6–23)
CO2: 29 mEq/L (ref 19–32)
Calcium: 9.7 mg/dL (ref 8.4–10.5)
Chloride: 99 mEq/L (ref 96–112)
Creatinine, Ser: 0.78 mg/dL (ref 0.50–1.10)
GFR calc Af Amer: >90 mL/min (ref >90)
GFR calc non Af Amer: 88 mL/min — ABNORMAL LOW (ref >90)
Glucose, Bld: 181 mg/dL — ABNORMAL HIGH (ref 70–99)
Potassium: 3.5 mEq/L (ref 3.5–5.1)
Sodium: 139 mEq/L (ref 135–145)

## 2011-11-20 LAB — CBC
HCT: 35.8 % — ABNORMAL LOW (ref 36.0–46.0)
Hemoglobin: 10.6 g/dL — ABNORMAL LOW (ref 12.0–15.0)
MCH: 21.8 pg — ABNORMAL LOW (ref 26.0–34.0)
MCHC: 29.6 g/dL — ABNORMAL LOW (ref 30.0–36.0)
MCV: 73.7 fL — ABNORMAL LOW (ref 78.0–100.0)
Platelets: 205 10*3/uL (ref 150–400)
RBC: 4.86 MIL/uL (ref 3.87–5.11)
RDW: 16 % — ABNORMAL HIGH (ref 11.5–15.5)
WBC: 8.4 10*3/uL (ref 4.0–10.5)

## 2011-11-20 LAB — SURGICAL PCR SCREEN
MRSA, PCR: NEGATIVE
Staphylococcus aureus: NEGATIVE

## 2011-11-20 NOTE — Consult Note (Signed)
See Anesthesia Consult note dated 11/20/11.  Shonna Chock, PA-C

## 2011-11-20 NOTE — Consult Note (Signed)
Anesthesia Consult:  Patient is a 61 year old female scheduled for craniotomy for tumor resection (meningioma) on 11/23/11 by Dr. [name].  Other history includes smoking, CVA with residual left hemiparesis, seizure (last three years ago), HTN, Bipolar disorder, smoking, pancreatitis '06, anemia, GERD, depression, OSA, DM2, anxiety, osteoporosis, migraines, glaucoma, hypothyroidism, hospitalization for metabolic encephalopathy 07/2011.  I was asked to see Ms. Glade during her PAT visit today due to drowsiness during her interview.  Patient lives alone but has an aide for 6 hours/day.  She uses a transportation service, and did not drive herself today.  She also utilizes a service that sets aside her daily medications for two weeks at a time.  Her CBG was 77 and came up to 153 with drink and crackers.  Exam shows A&O X 4, PERRL, mild left hemiparesis (chronic), facial symmetry.  Heart RRR, lungs clear.  She denies known s/s infection such as dysuria.  She can ambulate unassisted with a slow, deliberate gait.  Speech is overall clear when fully awake.  She is easily arousable and appropriate when awake, but starts to drift asleep every few minutes.  She reported a mild headache earlier today which is rather chronic.  She feels at her baseline and reports she has episodes of drowsiness from time to time.  She thinks her aid will come by her home later today.   I spoke with both Dr. [name] and her PCP Dr. Syliva Overman.  Dr. Lodema Hong reported that patient had similar episodes in the past, but would not consider this type of behavior patient's norm.  She questioned whether patient was taking her medications as prescribed, which is difficult to prove although patient said she is compliant with the daily medications set aside for her.  Dr. Lodema Hong recommended having patient go to the ED for further evaluation.  Patient refused to go or be taken to the ED.  I notified Dr. [name] of Dr. Anthony Sar recommendations and  that patient refused further evaluation.  He had no additional recommendations.  Since patient was A&O, stable VS, and was not driving herself, it was felt that she could not be forced to present to the ED.  She did agree to contact EMS for any new or worsening symptoms such as severe headache or new visual/motor changes.    EKG on 10/18/11 showed NSR.  Echo on 07/15/08 showed: 1. Left ventricle: The cavity size was normal. Wall thickness was increased in a pattern of mild LVH. There was mild concentric hypertrophy. Systolic function was normal. The estimated ejection fraction was in the range of 60% to 65%. Wall motion was normal; there were no regional wall motion abnormalities. Doppler parameters are consistent with abnormal left ventricular relaxation (grade 1 diastolic dysfunction). 2. Aortic valve: Mildly calcified annulus. Trileaflet; mildly thickened leaflets. 3. Mitral valve: Mildly calcified annulus.  CXR from 10/18/11 showed no active lung disease. No change in mild bibasilar linear  atelectasis or scarring.  Labs noted.  She will be re-evaluated by has assigned Anesthesiologist on the day of surgery.    Shonna Chock, PA-C

## 2011-11-20 NOTE — Pre-Procedure Instructions (Addendum)
20 [name]  11/20/2011   Your procedure is scheduled on:  11/23/11  Report to [facility] at 530 AM.  Call this number if you have problems the morning of surgery: [phone]   Remember:   Do not eat food or drink:After Midnight.    Take these medicines the morning of surgery with A SIP OF WATER:metoprolol,decadron, lamictal  Catapres aciphex, zofran, ativan  STOP plavix now   Do not wear jewelry, make-up or nail polish.  Do not wear lotions, powders, or perfumes. You may wear deodorant.  Do not shave 48 hours prior to surgery. Men may shave face and neck.  Do not bring valuables to the hospital.  Contacts, dentures or bridgework may not be worn into surgery.  Leave suitcase in the car. After surgery it may be brought to your room.  For patients admitted to the hospital, checkout time is 11:00 AM the day of discharge.   Patients discharged the day of surgery will not be allowed to drive home.  Name and phone number of your driver:son corey lindsay    Special Instructions: CHG Shower Use Special Wash: 1/2 bottle night before surgery and 1/2 bottle morning of surgery.   Please read over the following fact sheets that you were given: Pain Booklet, Coughing and Deep Breathing, Blood Transfusion Information, MRSA Information and Surgical Site Infection Prevention

## 2011-11-20 NOTE — Progress Notes (Signed)
Sleep study 3.13 cxr 8.13 ekg 9.13 in epic

## 2011-11-20 NOTE — Progress Notes (Addendum)
Patient very drowsy, falls asleep during interview. cbg checked. 77 at 1153.  Soda graham crackers given to patient [name] anesthesia pa called to talk with patient. [name] in to talk with patient

## 2011-11-21 ENCOUNTER — Telehealth: Payer: Self-pay | Admitting: Family Medicine

## 2011-11-21 NOTE — Telephone Encounter (Signed)
I believe this has been done already, please research

## 2011-11-22 MED ORDER — VANCOMYCIN HCL IN DEXTROSE 1-5 GM/200ML-% IV SOLN
1000.0000 mg | INTRAVENOUS | Status: AC
  Administered 2011-11-23: 1000 mg via INTRAVENOUS
  Filled 2011-11-22: qty 200

## 2011-11-23 ENCOUNTER — Ambulatory Visit (HOSPITAL_COMMUNITY): Payer: Medicare Other | Admitting: Vascular Surgery

## 2011-11-23 ENCOUNTER — Encounter (HOSPITAL_COMMUNITY)
Admission: RE | Disposition: A | Discharge status: Skilled Nursing Facility | Payer: Self-pay | Source: Ambulatory Visit | Attending: Neurosurgery

## 2011-11-23 ENCOUNTER — Ambulatory Visit: Payer: Medicare Other | Admitting: Family Medicine

## 2011-11-23 ENCOUNTER — Encounter (HOSPITAL_COMMUNITY): Payer: Self-pay | Admitting: *Deleted

## 2011-11-23 ENCOUNTER — Encounter (HOSPITAL_COMMUNITY): Payer: Self-pay | Admitting: Vascular Surgery

## 2011-11-23 ENCOUNTER — Inpatient Hospital Stay (HOSPITAL_COMMUNITY): Payer: Medicare Other

## 2011-11-23 ENCOUNTER — Inpatient Hospital Stay (HOSPITAL_COMMUNITY)
Admission: RE | Admit: 2011-11-23 | Discharge: 2011-12-01 | DRG: 025 | Disposition: A | Discharge status: Skilled Nursing Facility | Payer: Medicare Other | Source: Ambulatory Visit | Attending: Neurosurgery | Admitting: Neurosurgery

## 2011-11-23 DIAGNOSIS — M810 Age-related osteoporosis without current pathological fracture: Secondary | ICD-10-CM | POA: Diagnosis present

## 2011-11-23 DIAGNOSIS — I10 Essential (primary) hypertension: Secondary | ICD-10-CM | POA: Diagnosis present

## 2011-11-23 DIAGNOSIS — F411 Generalized anxiety disorder: Secondary | ICD-10-CM | POA: Diagnosis present

## 2011-11-23 DIAGNOSIS — D320 Benign neoplasm of cerebral meninges: Principal | ICD-10-CM | POA: Diagnosis present

## 2011-11-23 DIAGNOSIS — F17200 Nicotine dependence, unspecified, uncomplicated: Secondary | ICD-10-CM | POA: Diagnosis present

## 2011-11-23 DIAGNOSIS — Z01812 Encounter for preprocedural laboratory examination: Secondary | ICD-10-CM

## 2011-11-23 DIAGNOSIS — E039 Hypothyroidism, unspecified: Secondary | ICD-10-CM | POA: Diagnosis present

## 2011-11-23 DIAGNOSIS — Z8673 Personal history of transient ischemic attack (TIA), and cerebral infarction without residual deficits: Secondary | ICD-10-CM

## 2011-11-23 DIAGNOSIS — K219 Gastro-esophageal reflux disease without esophagitis: Secondary | ICD-10-CM | POA: Diagnosis present

## 2011-11-23 DIAGNOSIS — E669 Obesity, unspecified: Secondary | ICD-10-CM | POA: Diagnosis present

## 2011-11-23 DIAGNOSIS — G936 Cerebral edema: Secondary | ICD-10-CM | POA: Diagnosis present

## 2011-11-23 DIAGNOSIS — E119 Type 2 diabetes mellitus without complications: Secondary | ICD-10-CM

## 2011-11-23 DIAGNOSIS — D649 Anemia, unspecified: Secondary | ICD-10-CM | POA: Diagnosis present

## 2011-11-23 HISTORY — PX: CRANIOTOMY: SHX93

## 2011-11-23 LAB — POCT I-STAT 7, (LYTES, BLD GAS, ICA,H+H)
Acid-base deficit: 7 mmol/L — ABNORMAL HIGH (ref 0.0–2.0)
Acid-base deficit: 4 mmol/L — ABNORMAL HIGH (ref 0.0–2.0)
Acid-base deficit: 1 mmol/L (ref 0.0–2.0)
Acid-base deficit: 3 mmol/L — ABNORMAL HIGH (ref 0.0–2.0)
Bicarbonate: 18.8 mEq/L — ABNORMAL LOW (ref 20.0–24.0)
Bicarbonate: 20.2 mEq/L (ref 20.0–24.0)
Bicarbonate: 21.6 mEq/L (ref 20.0–24.0)
Bicarbonate: 23 mEq/L (ref 20.0–24.0)
Calcium, Ion: 0.98 mmol/L — ABNORMAL LOW (ref 1.13–1.30)
Calcium, Ion: 0.9 mmol/L — ABNORMAL LOW (ref 1.13–1.30)
Calcium, Ion: 1.19 mmol/L (ref 1.13–1.30)
Calcium, Ion: 1.21 mmol/L (ref 1.13–1.30)
HCT: 23 % — ABNORMAL LOW (ref 36.0–46.0)
HCT: 23 % — ABNORMAL LOW (ref 36.0–46.0)
HCT: 31 % — ABNORMAL LOW (ref 36.0–46.0)
HCT: 31 % — ABNORMAL LOW (ref 36.0–46.0)
Hemoglobin: 7.8 g/dL — ABNORMAL LOW (ref 12.0–15.0)
Hemoglobin: 7.8 g/dL — ABNORMAL LOW (ref 12.0–15.0)
Hemoglobin: 10.5 g/dL — ABNORMAL LOW (ref 12.0–15.0)
Hemoglobin: 10.5 g/dL — ABNORMAL LOW (ref 12.0–15.0)
O2 Saturation: 100 %
O2 Saturation: 100 %
O2 Saturation: 100 %
O2 Saturation: 100 %
Patient temperature: 36
Patient temperature: 36.4
Patient temperature: 36.7
Patient temperature: 36.9
Potassium: 2.9 mEq/L — ABNORMAL LOW (ref 3.5–5.1)
Potassium: 4.2 mEq/L (ref 3.5–5.1)
Potassium: 4.5 mEq/L (ref 3.5–5.1)
Potassium: 4.5 mEq/L (ref 3.5–5.1)
Sodium: 147 mEq/L — ABNORMAL HIGH (ref 135–145)
Sodium: 146 mEq/L — ABNORMAL HIGH (ref 135–145)
Sodium: 143 mEq/L (ref 135–145)
Sodium: 143 mEq/L (ref 135–145)
TCO2: 20 mmol/L (ref 0–100)
TCO2: 21 mmol/L (ref 0–100)
TCO2: 23 mmol/L (ref 0–100)
TCO2: 24 mmol/L (ref 0–100)
pCO2 arterial: 34.8 mmHg — ABNORMAL LOW (ref 35.0–45.0)
pCO2 arterial: 31.2 mmHg — ABNORMAL LOW (ref 35.0–45.0)
pCO2 arterial: 34.8 mmHg — ABNORMAL LOW (ref 35.0–45.0)
pCO2 arterial: 35.4 mmHg (ref 35.0–45.0)
pH, Arterial: 7.334 — ABNORMAL LOW (ref 7.350–7.450)
pH, Arterial: 7.417 (ref 7.350–7.450)
pH, Arterial: 7.401 (ref 7.350–7.450)
pH, Arterial: 7.421 (ref 7.350–7.450)
pO2, Arterial: 392 mmHg — ABNORMAL HIGH (ref 80.0–100.0)
pO2, Arterial: 425 mmHg — ABNORMAL HIGH (ref 80.0–100.0)
pO2, Arterial: 369 mmHg — ABNORMAL HIGH (ref 80.0–100.0)
pO2, Arterial: 387 mmHg — ABNORMAL HIGH (ref 80.0–100.0)

## 2011-11-23 LAB — BASIC METABOLIC PANEL
BUN: 13 mg/dL (ref 6–23)
CO2: 22 mEq/L (ref 19–32)
Calcium: 8.5 mg/dL (ref 8.4–10.5)
Chloride: 109 mEq/L (ref 96–112)
Creatinine, Ser: 0.65 mg/dL (ref 0.50–1.10)
GFR calc Af Amer: >90 mL/min (ref >90)
GFR calc non Af Amer: >90 mL/min (ref >90)
Glucose, Bld: 251 mg/dL — ABNORMAL HIGH (ref 70–99)
Potassium: 4.4 mEq/L (ref 3.5–5.1)
Sodium: 144 mEq/L (ref 135–145)

## 2011-11-23 LAB — GLUCOSE, CAPILLARY
Glucose-Capillary: 33 mg/dL — CL (ref 70–99)
Glucose-Capillary: 166 mg/dL — ABNORMAL HIGH (ref 70–99)

## 2011-11-23 LAB — CBC WITH DIFFERENTIAL/PLATELET
Basophils Absolute: 0 10*3/uL (ref 0.0–0.1)
Basophils Relative: 0 % (ref 0–1)
Eosinophils Absolute: 0 10*3/uL (ref 0.0–0.7)
Eosinophils Relative: 0 % (ref 0–5)
HCT: 33 % — ABNORMAL LOW (ref 36.0–46.0)
Hemoglobin: 10.3 g/dL — ABNORMAL LOW (ref 12.0–15.0)
Lymphocytes Relative: 21 % (ref 12–46)
Lymphs Abs: 1.5 10*3/uL (ref 0.7–4.0)
MCH: 23.4 pg — ABNORMAL LOW (ref 26.0–34.0)
MCHC: 31.2 g/dL (ref 30.0–36.0)
MCV: 74.8 fL — ABNORMAL LOW (ref 78.0–100.0)
Monocytes Absolute: 0.2 10*3/uL (ref 0.1–1.0)
Monocytes Relative: 2 % — ABNORMAL LOW (ref 3–12)
Neutro Abs: 5.5 10*3/uL (ref 1.7–7.7)
Neutrophils Relative %: 77 % (ref 43–77)
Platelets: 163 10*3/uL (ref 150–400)
RBC: 4.41 MIL/uL (ref 3.87–5.11)
RDW: 17.3 % — ABNORMAL HIGH (ref 11.5–15.5)
WBC: 7.1 10*3/uL (ref 4.0–10.5)

## 2011-11-23 LAB — MRSA PCR SCREENING: MRSA by PCR: NEGATIVE

## 2011-11-23 LAB — POCT I-STAT GLUCOSE
Glucose, Bld: 179 mg/dL — ABNORMAL HIGH (ref 70–99)
Operator id: 297501

## 2011-11-23 SURGERY — CRANIOTOMY TUMOR EXCISION
Anesthesia: General | Site: Head | Wound class: Clean

## 2011-11-23 MED ORDER — POTASSIUM CHLORIDE 10 MEQ/50ML IV SOLN
10.0000 meq | INTRAVENOUS | Status: AC
  Administered 2011-11-23 (×3): 10 meq via INTRAVENOUS
  Filled 2011-11-23 (×3): qty 50

## 2011-11-23 MED ORDER — LEVOTHYROXINE SODIUM 50 MCG PO TABS
50.0000 ug | ORAL_TABLET | ORAL | Status: DC
  Administered 2011-11-24 – 2011-12-01 (×8): 50 ug via ORAL
  Filled 2011-11-23 (×9): qty 1

## 2011-11-23 MED ORDER — HYDRALAZINE HCL 20 MG/ML IJ SOLN
5.0000 mg | INTRAMUSCULAR | Status: DC | PRN
  Administered 2011-11-23: 5 mg via INTRAVENOUS
  Filled 2011-11-23: qty 1

## 2011-11-23 MED ORDER — LABETALOL HCL 5 MG/ML IV SOLN
INTRAVENOUS | Status: DC | PRN
  Administered 2011-11-23 (×3): 10 mg via INTRAVENOUS

## 2011-11-23 MED ORDER — DROPERIDOL 2.5 MG/ML IJ SOLN: 0.6250 mg | INTRAMUSCULAR | Status: DC | PRN

## 2011-11-23 MED ORDER — INSULIN ASPART 100 UNIT/ML ~~LOC~~ SOLN
0.0000 [IU] | SUBCUTANEOUS | Status: DC
  Administered 2011-11-23: 8 [IU] via SUBCUTANEOUS
  Administered 2011-11-23: 3 [IU] via SUBCUTANEOUS
  Administered 2011-11-24: 5 [IU] via SUBCUTANEOUS

## 2011-11-23 MED ORDER — MAGNESIUM HYDROXIDE 400 MG/5ML PO SUSP: 30.0000 mL | Freq: Every day | ORAL | Status: DC | PRN

## 2011-11-23 MED ORDER — SERTRALINE HCL 50 MG PO TABS
150.0000 mg | ORAL_TABLET | Freq: Every day | ORAL | Status: DC
  Administered 2011-11-23 – 2011-11-30 (×8): 150 mg via ORAL
  Filled 2011-11-23 (×9): qty 1

## 2011-11-23 MED ORDER — TRAZODONE HCL 50 MG PO TABS
50.0000 mg | ORAL_TABLET | Freq: Every day | ORAL | Status: DC
  Administered 2011-11-23 – 2011-11-30 (×8): 50 mg via ORAL
  Filled 2011-11-23 (×9): qty 1

## 2011-11-23 MED ORDER — ONDANSETRON HCL 4 MG/2ML IJ SOLN: 4.0000 mg | INTRAMUSCULAR | Status: DC | PRN

## 2011-11-23 MED ORDER — GLYCOPYRROLATE 0.2 MG/ML IJ SOLN
INTRAMUSCULAR | Status: DC | PRN
  Administered 2011-11-23: .5 mg via INTRAVENOUS

## 2011-11-23 MED ORDER — BACITRACIN ZINC 500 UNIT/GM EX OINT
TOPICAL_OINTMENT | CUTANEOUS | Status: DC | PRN
  Administered 2011-11-23: 1 via TOPICAL

## 2011-11-23 MED ORDER — EPHEDRINE SULFATE 50 MG/ML IJ SOLN
INTRAMUSCULAR | Status: DC | PRN
  Administered 2011-11-23 (×5): 10 mg via INTRAVENOUS

## 2011-11-23 MED ORDER — BUPIVACAINE HCL (PF) 0.5 % IJ SOLN
INTRAMUSCULAR | Status: DC | PRN
  Administered 2011-11-23: 20 mL

## 2011-11-23 MED ORDER — SODIUM CHLORIDE 0.9 % IR SOLN
Status: DC | PRN
  Administered 2011-11-23: 10:00:00

## 2011-11-23 MED ORDER — MONTELUKAST SODIUM 10 MG PO TABS
10.0000 mg | ORAL_TABLET | Freq: Every day | ORAL | Status: DC
  Administered 2011-11-24 – 2011-12-01 (×8): 10 mg via ORAL
  Filled 2011-11-23 (×8): qty 1

## 2011-11-23 MED ORDER — DEXAMETHASONE SODIUM PHOSPHATE 4 MG/ML IJ SOLN: 4.0000 mg | Freq: Once | INTRAMUSCULAR | Status: DC

## 2011-11-23 MED ORDER — BACITRACIN 50000 UNITS IM SOLR
INTRAMUSCULAR | Status: AC
  Filled 2011-11-23: qty 1

## 2011-11-23 MED ORDER — LIDOCAINE HCL (CARDIAC) 20 MG/ML IV SOLN
INTRAVENOUS | Status: DC | PRN
  Administered 2011-11-23: 50 mg via INTRAVENOUS

## 2011-11-23 MED ORDER — PROPOFOL 10 MG/ML IV BOLUS
INTRAVENOUS | Status: DC | PRN
  Administered 2011-11-23: 100 mg via INTRAVENOUS

## 2011-11-23 MED ORDER — SODIUM BICARBONATE 8.4 % IV SOLN
INTRAVENOUS | Status: DC | PRN
  Administered 2011-11-23 (×2): 50 meq via INTRAVENOUS

## 2011-11-23 MED ORDER — HYDROCHLOROTHIAZIDE 12.5 MG PO CAPS
12.5000 mg | ORAL_CAPSULE | Freq: Every day | ORAL | Status: DC
  Administered 2011-11-24 – 2011-12-01 (×8): 12.5 mg via ORAL
  Filled 2011-11-23 (×8): qty 1

## 2011-11-23 MED ORDER — LIDOCAINE-EPINEPHRINE 1 %-1:100000 IJ SOLN
INTRAMUSCULAR | Status: DC | PRN
  Administered 2011-11-23: 20 mL

## 2011-11-23 MED ORDER — GENTAMICIN IN SALINE 1.6-0.9 MG/ML-% IV SOLN
80.0000 mg | INTRAVENOUS | Status: AC
  Administered 2011-11-23: 80 mg via INTRAVENOUS
  Filled 2011-11-23: qty 50

## 2011-11-23 MED ORDER — PHENYLEPHRINE HCL 10 MG/ML IJ SOLN
INTRAMUSCULAR | Status: DC | PRN
  Administered 2011-11-23: 80 ug via INTRAVENOUS
  Administered 2011-11-23: 100 ug via INTRAVENOUS
  Administered 2011-11-23: 40 ug via INTRAVENOUS

## 2011-11-23 MED ORDER — SODIUM BICARBONATE 8.4 % IV SOLN
100.0000 meq | INTRAVENOUS | Status: AC
  Filled 2011-11-23: qty 100

## 2011-11-23 MED ORDER — ROCURONIUM BROMIDE 100 MG/10ML IV SOLN
INTRAVENOUS | Status: DC | PRN
  Administered 2011-11-23: 50 mg via INTRAVENOUS
  Administered 2011-11-23: 30 mg via INTRAVENOUS
  Administered 2011-11-23 (×2): 20 mg via INTRAVENOUS

## 2011-11-23 MED ORDER — DEXAMETHASONE SODIUM PHOSPHATE 10 MG/ML IJ SOLN
8.0000 mg | INTRAMUSCULAR | Status: AC
  Administered 2011-11-23: 8 mg via INTRAVENOUS
  Administered 2011-11-23: 4 mg via INTRAVENOUS
  Filled 2011-11-23: qty 0.8

## 2011-11-23 MED ORDER — PANTOPRAZOLE SODIUM 40 MG IV SOLR
40.0000 mg | Freq: Every day | INTRAVENOUS | Status: DC
  Administered 2011-11-23: 40 mg via INTRAVENOUS
  Filled 2011-11-23 (×2): qty 40

## 2011-11-23 MED ORDER — CYCLOSPORINE 0.05 % OP EMUL
1.0000 [drp] | Freq: Two times a day (BID) | OPHTHALMIC | Status: DC
  Administered 2011-11-23 – 2011-11-25 (×4): 1 [drp] via OPHTHALMIC
  Administered 2011-11-25: 8 [drp] via OPHTHALMIC
  Administered 2011-11-26 – 2011-11-28 (×3): 1 [drp] via OPHTHALMIC
  Administered 2011-11-29: 11:00:00 via OPHTHALMIC
  Administered 2011-11-30 – 2011-12-01 (×2): 1 [drp] via OPHTHALMIC
  Filled 2011-11-23 (×18): qty 1

## 2011-11-23 MED ORDER — HYDROMORPHONE HCL PF 1 MG/ML IJ SOLN: 0.2500 mg | INTRAMUSCULAR | Status: DC | PRN

## 2011-11-23 MED ORDER — FENTANYL CITRATE 0.05 MG/ML IJ SOLN
INTRAMUSCULAR | Status: DC | PRN
  Administered 2011-11-23: 100 ug via INTRAVENOUS
  Administered 2011-11-23 (×4): 50 ug via INTRAVENOUS
  Administered 2011-11-23: 100 ug via INTRAVENOUS

## 2011-11-23 MED ORDER — DEXAMETHASONE SODIUM PHOSPHATE 4 MG/ML IJ SOLN
4.0000 mg | Freq: Four times a day (QID) | INTRAMUSCULAR | Status: AC
  Administered 2011-11-23 – 2011-11-24 (×5): 4 mg via INTRAVENOUS
  Filled 2011-11-23 (×7): qty 1

## 2011-11-23 MED ORDER — ONDANSETRON HCL 4 MG PO TABS: 4.0000 mg | ORAL_TABLET | ORAL | Status: DC | PRN

## 2011-11-23 MED ORDER — METOPROLOL TARTRATE 50 MG PO TABS
ORAL_TABLET | ORAL | Status: AC
  Administered 2011-11-23: 50 mg via ORAL
  Filled 2011-11-23: qty 1

## 2011-11-23 MED ORDER — SODIUM CHLORIDE 0.9 % IV SOLN
INTRAVENOUS | Status: DC
  Administered 2011-11-23: 07:00:00 via INTRAVENOUS

## 2011-11-23 MED ORDER — METFORMIN HCL 500 MG PO TABS
1000.0000 mg | ORAL_TABLET | Freq: Two times a day (BID) | ORAL | Status: DC
  Administered 2011-11-24 – 2011-12-01 (×15): 1000 mg via ORAL
  Filled 2011-11-23 (×19): qty 2

## 2011-11-23 MED ORDER — ALBUMIN HUMAN 5 % IV SOLN
INTRAVENOUS | Status: DC | PRN
  Administered 2011-11-23: 11:00:00 via INTRAVENOUS

## 2011-11-23 MED ORDER — METOPROLOL TARTRATE 50 MG PO TABS
50.0000 mg | ORAL_TABLET | Freq: Once | ORAL | Status: AC
  Administered 2011-11-23: 50 mg via ORAL

## 2011-11-23 MED ORDER — DEXTROSE 50 % IV SOLN
INTRAVENOUS | Status: AC
  Administered 2011-11-23: 25 g via INTRAVENOUS
  Filled 2011-11-23: qty 50

## 2011-11-23 MED ORDER — CLINDAMYCIN HCL 300 MG PO CAPS
300.0000 mg | ORAL_CAPSULE | Freq: Three times a day (TID) | ORAL | Status: DC
  Administered 2011-11-23 – 2011-11-26 (×11): 300 mg via ORAL
  Filled 2011-11-23 (×14): qty 1

## 2011-11-23 MED ORDER — PHENYLEPHRINE HCL 10 MG/ML IJ SOLN
10.0000 mg | INTRAVENOUS | Status: DC | PRN
  Administered 2011-11-23: 20 ug/min via INTRAVENOUS

## 2011-11-23 MED ORDER — NEOSTIGMINE METHYLSULFATE 1 MG/ML IJ SOLN
INTRAMUSCULAR | Status: DC | PRN
  Administered 2011-11-23: 4 mg via INTRAVENOUS

## 2011-11-23 MED ORDER — ATORVASTATIN CALCIUM 20 MG PO TABS
20.0000 mg | ORAL_TABLET | Freq: Every day | ORAL | Status: DC
  Administered 2011-11-23 – 2011-11-30 (×7): 20 mg via ORAL
  Filled 2011-11-23 (×10): qty 1

## 2011-11-23 MED ORDER — HYDROXYZINE HCL 25 MG PO TABS
50.0000 mg | ORAL_TABLET | ORAL | Status: DC | PRN
  Filled 2011-11-23: qty 1

## 2011-11-23 MED ORDER — DEXTROSE 50 % IV SOLN
25.0000 g | Freq: Once | INTRAVENOUS | Status: AC
  Administered 2011-11-23: 25 g via INTRAVENOUS

## 2011-11-23 MED ORDER — PREGABALIN 75 MG PO CAPS
75.0000 mg | ORAL_CAPSULE | Freq: Two times a day (BID) | ORAL | Status: DC
  Administered 2011-11-23 – 2011-12-01 (×16): 75 mg via ORAL
  Filled 2011-11-23 (×16): qty 1

## 2011-11-23 MED ORDER — MIDAZOLAM HCL 5 MG/5ML IJ SOLN
INTRAMUSCULAR | Status: DC | PRN
  Administered 2011-11-23 (×2): 1 mg via INTRAVENOUS

## 2011-11-23 MED ORDER — BISACODYL 10 MG RE SUPP: 10.0000 mg | Freq: Every day | RECTAL | Status: DC | PRN

## 2011-11-23 MED ORDER — MICROFIBRILLAR COLL HEMOSTAT EX PADS
MEDICATED_PAD | CUTANEOUS | Status: DC | PRN
  Administered 2011-11-23: 1 via TOPICAL

## 2011-11-23 MED ORDER — LAMOTRIGINE 100 MG PO TABS
100.0000 mg | ORAL_TABLET | Freq: Every day | ORAL | Status: DC
  Administered 2011-11-23 – 2011-12-01 (×9): 100 mg via ORAL
  Filled 2011-11-23 (×9): qty 1

## 2011-11-23 MED ORDER — SODIUM CHLORIDE 0.9 % IV SOLN: INTRAVENOUS | Status: DC

## 2011-11-23 MED ORDER — FAMOTIDINE IN NACL 20-0.9 MG/50ML-% IV SOLN
20.0000 mg | INTRAVENOUS | Status: AC
  Administered 2011-11-23: 20 mg via INTRAVENOUS
  Filled 2011-11-23: qty 50

## 2011-11-23 MED ORDER — SODIUM CHLORIDE 0.9 % IV SOLN
INTRAVENOUS | Status: DC | PRN
  Administered 2011-11-23 (×4): via INTRAVENOUS

## 2011-11-23 MED ORDER — METOPROLOL TARTRATE 50 MG PO TABS
50.0000 mg | ORAL_TABLET | Freq: Two times a day (BID) | ORAL | Status: DC
Start: 2011-11-23 — End: 2011-12-01
  Administered 2011-11-23 – 2011-12-01 (×16): 50 mg via ORAL
  Filled 2011-11-23 (×18): qty 1

## 2011-11-23 MED ORDER — DEXAMETHASONE SODIUM PHOSPHATE 10 MG/ML IJ SOLN
INTRAMUSCULAR | Status: AC
  Administered 2011-11-23: 4 mg via INTRAVENOUS
  Filled 2011-11-23: qty 1

## 2011-11-23 MED ORDER — SUCCINYLCHOLINE CHLORIDE 20 MG/ML IJ SOLN
INTRAMUSCULAR | Status: DC | PRN
  Administered 2011-11-23: 100 mg via INTRAVENOUS

## 2011-11-23 MED ORDER — SODIUM CHLORIDE 0.9 % IV SOLN
INTRAVENOUS | Status: AC
  Filled 2011-11-23: qty 500

## 2011-11-23 MED ORDER — OXYCODONE HCL 5 MG/5ML PO SOLN: 5.0000 mg | Freq: Once | ORAL | Status: DC | PRN

## 2011-11-23 MED ORDER — LABETALOL HCL 5 MG/ML IV SOLN
5.0000 mg | INTRAVENOUS | Status: DC | PRN
  Administered 2011-11-23 – 2011-11-26 (×4): 10 mg via INTRAVENOUS
  Filled 2011-11-23 (×2): qty 4
  Filled 2011-11-23: qty 40

## 2011-11-23 MED ORDER — THIOTHIXENE 2 MG PO CAPS
2.0000 mg | ORAL_CAPSULE | Freq: Every day | ORAL | Status: DC
  Administered 2011-11-23 – 2011-11-30 (×8): 2 mg via ORAL
  Filled 2011-11-23 (×9): qty 1

## 2011-11-23 MED ORDER — CLONIDINE HCL 0.3 MG PO TABS
0.3000 mg | ORAL_TABLET | Freq: Three times a day (TID) | ORAL | Status: DC
Start: 2011-11-23 — End: 2011-12-01
  Administered 2011-11-23 – 2011-12-01 (×23): 0.3 mg via ORAL
  Filled 2011-11-23 (×26): qty 1

## 2011-11-23 MED ORDER — MORPHINE SULFATE 2 MG/ML IJ SOLN
1.0000 mg | INTRAMUSCULAR | Status: DC | PRN
  Administered 2011-11-23: 2 mg via INTRAVENOUS
  Administered 2011-11-23: 1 mg via INTRAVENOUS
  Administered 2011-11-24 (×2): 2 mg via INTRAVENOUS
  Filled 2011-11-23 (×4): qty 1

## 2011-11-23 MED ORDER — GLIPIZIDE 10 MG PO TABS
10.0000 mg | ORAL_TABLET | Freq: Every day | ORAL | Status: DC
  Administered 2011-11-24 – 2011-11-26 (×3): 10 mg via ORAL
  Filled 2011-11-23 (×6): qty 1

## 2011-11-23 MED ORDER — METHYLENE BLUE 1 % INJ SOLN
INTRAMUSCULAR | Status: DC | PRN
  Administered 2011-11-23: 1 mL

## 2011-11-23 MED ORDER — POTASSIUM CHLORIDE IN NACL 20-0.9 MEQ/L-% IV SOLN
INTRAVENOUS | Status: DC
  Administered 2011-11-23 – 2011-11-24 (×3): via INTRAVENOUS
  Filled 2011-11-23 (×7): qty 1000

## 2011-11-23 MED ORDER — THROMBIN 20000 UNITS EX SOLR
CUTANEOUS | Status: DC | PRN
  Administered 2011-11-23 (×3): via TOPICAL

## 2011-11-23 MED ORDER — HYDROXYZINE HCL 50 MG/ML IM SOLN
50.0000 mg | Freq: Four times a day (QID) | INTRAMUSCULAR | Status: DC | PRN
  Filled 2011-11-23: qty 1

## 2011-11-23 MED ORDER — OXYCODONE HCL 5 MG PO TABS: 5.0000 mg | ORAL_TABLET | Freq: Once | ORAL | Status: DC | PRN

## 2011-11-23 MED ORDER — LORAZEPAM 0.5 MG PO TABS
0.5000 mg | ORAL_TABLET | Freq: Three times a day (TID) | ORAL | Status: DC
  Administered 2011-11-23 – 2011-12-01 (×23): 0.5 mg via ORAL
  Filled 2011-11-23 (×23): qty 1

## 2011-11-23 MED ORDER — SODIUM CHLORIDE 0.9 % IR SOLN
Status: DC | PRN
  Administered 2011-11-23: 1
  Administered 2011-11-23: 1000 mL
  Administered 2011-11-23: 1
  Administered 2011-11-23: 1000 mL

## 2011-11-23 MED ORDER — LINAGLIPTIN 5 MG PO TABS
5.0000 mg | ORAL_TABLET | Freq: Every day | ORAL | Status: DC
  Administered 2011-11-24 – 2011-12-01 (×8): 5 mg via ORAL
  Filled 2011-11-23 (×8): qty 1

## 2011-11-23 MED ORDER — MIRTAZAPINE 30 MG PO TABS
30.0000 mg | ORAL_TABLET | Freq: Every day | ORAL | Status: DC
  Administered 2011-11-23 – 2011-11-30 (×8): 30 mg via ORAL
  Filled 2011-11-23 (×9): qty 1

## 2011-11-23 SURGICAL SUPPLY — 92 items
APPLICATOR COTTON TIP 6IN STRL (MISCELLANEOUS) ×2 IMPLANT
BAG DECANTER FOR FLEXI CONT (MISCELLANEOUS) ×2 IMPLANT
BALL CTTN LRG ABS STRL LF (GAUZE/BANDAGES/DRESSINGS)
BANDAGE GAUZE 4  KLING STR (GAUZE/BANDAGES/DRESSINGS) ×4 IMPLANT
BANDAGE GAUZE ELAST BULKY 4 IN (GAUZE/BANDAGES/DRESSINGS) ×2 IMPLANT
BIT DRILL WIRE PASS 1.3MM (BIT) IMPLANT
BLADE SAW GIGLI 16 STRL (MISCELLANEOUS) ×3 IMPLANT
BLADE SURG ROTATE 9660 (MISCELLANEOUS) ×3 IMPLANT
BLADE ULTRA TIP 2M (BLADE) ×1 IMPLANT
BRUSH SCRUB EZ PLAIN DRY (MISCELLANEOUS) ×2 IMPLANT
BUR ACORN 6.0 PRECISION (BURR) ×2 IMPLANT
BUR MATCHSTICK NEURO 3.0 LAGG (BURR) ×1 IMPLANT
BUR ROUTER D-58 CRANI (BURR) ×1 IMPLANT
BUR ROUTER D-59 CRANI (BURR) ×1 IMPLANT
CANISTER SUCTION 2500CC (MISCELLANEOUS) ×3 IMPLANT
CLAW UNIV SL SPETZLER MIC (INSTRUMENTS) ×2 IMPLANT
CLIP RANEY DISP (INSTRUMENTS) ×1 IMPLANT
CLIP TI MEDIUM 6 (CLIP) ×5 IMPLANT
CLOTH BEACON ORANGE TIMEOUT ST (SAFETY) ×2 IMPLANT
CONT SPEC 4OZ CLIKSEAL STRL BL (MISCELLANEOUS) ×5 IMPLANT
CORDS BIPOLAR (ELECTRODE) ×2 IMPLANT
COTTONBALL LRG STERILE PKG (GAUZE/BANDAGES/DRESSINGS) IMPLANT
DRAIN SNY WOU 7FLT (WOUND CARE) IMPLANT
DRAIN SUBARACHNOID (WOUND CARE) IMPLANT
DRAPE MICROSCOPE LEICA (MISCELLANEOUS) ×1 IMPLANT
DRAPE NEUROLOGICAL W/INCISE (DRAPES) ×2 IMPLANT
DRAPE PROXIMA HALF (DRAPES) ×1 IMPLANT
DRAPE STERI IOBAN 125X83 (DRAPES) IMPLANT
DRAPE SURG 17X23 STRL (DRAPES) IMPLANT
DRAPE WARM FLUID 44X44 (DRAPE) ×2 IMPLANT
DRILL WIRE PASS 1.3MM (BIT)
DRSG ADAPTIC 3X8 NADH LF (GAUZE/BANDAGES/DRESSINGS) ×2 IMPLANT
DURAGUARD 06CMX08CM ×1 IMPLANT
ELECT CAUTERY BLADE 6.4 (BLADE) ×1 IMPLANT
ELECT REM PT RETURN 9FT ADLT (ELECTROSURGICAL) ×2
ELECTRODE REM PT RTRN 9FT ADLT (ELECTROSURGICAL) ×1 IMPLANT
EVACUATOR 1/8 PVC DRAIN (DRAIN) IMPLANT
EVACUATOR SILICONE 100CC (DRAIN) IMPLANT
GLOVE BIOGEL PI IND STRL 7.5 (GLOVE) IMPLANT
GLOVE BIOGEL PI IND STRL 8 (GLOVE) ×1 IMPLANT
GLOVE BIOGEL PI INDICATOR 7.5 (GLOVE) ×2
GLOVE BIOGEL PI INDICATOR 8 (GLOVE) ×3
GLOVE ECLIPSE 7.5 STRL STRAW (GLOVE) ×14 IMPLANT
GLOVE EXAM NITRILE LRG STRL (GLOVE) IMPLANT
GLOVE EXAM NITRILE MD LF STRL (GLOVE) ×1 IMPLANT
GLOVE EXAM NITRILE XL STR (GLOVE) IMPLANT
GLOVE EXAM NITRILE XS STR PU (GLOVE) IMPLANT
GOWN BRE IMP SLV AUR LG STRL (GOWN DISPOSABLE) ×2 IMPLANT
GOWN BRE IMP SLV AUR XL STRL (GOWN DISPOSABLE) ×2 IMPLANT
GOWN STRL REIN 2XL LVL4 (GOWN DISPOSABLE) ×2 IMPLANT
HEMOSTAT SURGICEL 2X14 (HEMOSTASIS) ×3 IMPLANT
HOOK DURA (MISCELLANEOUS) ×2 IMPLANT
KIT BASIN OR (CUSTOM PROCEDURE TRAY) ×2 IMPLANT
KIT ROOM TURNOVER OR (KITS) ×2 IMPLANT
LIGACLIP MED TITANIUM (CLIP) ×5 IMPLANT
MARKER SPHERE PSV REFLC NDI (MISCELLANEOUS) IMPLANT
NDL SPNL 22GX3.5 QUINCKE BK (NEEDLE) ×1 IMPLANT
NEEDLE SPNL 22GX3.5 QUINCKE BK (NEEDLE) ×2 IMPLANT
NS IRRIG 1000ML POUR BTL (IV SOLUTION) ×6 IMPLANT
PACK CRANIOTOMY (CUSTOM PROCEDURE TRAY) ×2 IMPLANT
PAD ARMBOARD 7.5X6 YLW CONV (MISCELLANEOUS) ×4 IMPLANT
PAD EYE OVAL STERILE LF (GAUZE/BANDAGES/DRESSINGS) IMPLANT
PATTIES SURGICAL .25X.25 (GAUZE/BANDAGES/DRESSINGS) IMPLANT
PATTIES SURGICAL .5 X.5 (GAUZE/BANDAGES/DRESSINGS) ×3 IMPLANT
PATTIES SURGICAL .5 X1 (DISPOSABLE) ×1 IMPLANT
PATTIES SURGICAL .5 X3 (DISPOSABLE) ×1 IMPLANT
PATTIES SURGICAL 1/4 X 3 (GAUZE/BANDAGES/DRESSINGS) ×2 IMPLANT
PATTIES SURGICAL 1X1 (DISPOSABLE) ×2 IMPLANT
PATTIES SURGICAL 3 X3 (GAUZE/BANDAGES/DRESSINGS)
PATTIES SURGICAL 3X3 (GAUZE/BANDAGES/DRESSINGS) IMPLANT
PLATE 1.5/0.5 18.5MM BURR HOLE (Plate) ×3 IMPLANT
PLATE 1.5/0.5 22MM BURR HO (Plate) ×2 IMPLANT
RUBBERBAND STERILE (MISCELLANEOUS) ×2 IMPLANT
SCREW SELF DRILL HT 1.5/4MM (Screw) ×22 IMPLANT
SET TUBING W/EXT DISP (INSTRUMENTS) ×1 IMPLANT
SPONGE GAUZE 4X4 12PLY (GAUZE/BANDAGES/DRESSINGS) ×3 IMPLANT
SPONGE NEURO XRAY DETECT 1X3 (DISPOSABLE) ×1 IMPLANT
SPONGE SURGIFOAM ABS GEL 100 (HEMOSTASIS) ×4 IMPLANT
STAPLER SKIN PROX WIDE 3.9 (STAPLE) ×3 IMPLANT
SUT BONE WAX W31G (SUTURE) ×1 IMPLANT
SUT NURALON 4 0 TR CR/8 (SUTURE) ×5 IMPLANT
SUT VIC AB 0 CT1 18XCR BRD8 (SUTURE) ×2 IMPLANT
SUT VIC AB 0 CT1 8-18 (SUTURE) ×8
SUT VIC AB 2-0 CP2 18 (SUTURE) ×4 IMPLANT
SYR 20ML ECCENTRIC (SYRINGE) ×2 IMPLANT
SYR BULB IRRIGATION 50ML (SYRINGE) ×1 IMPLANT
SYR CONTROL 10ML LL (SYRINGE) ×2 IMPLANT
TOWEL OR 17X24 6PK STRL BLUE (TOWEL DISPOSABLE) ×2 IMPLANT
TOWEL OR 17X26 10 PK STRL BLUE (TOWEL DISPOSABLE) ×2 IMPLANT
TRAY FOLEY IC TEMP SENS 16FR (CATHETERS) ×1 IMPLANT
UNDERPAD 30X30 INCONTINENT (UNDERPADS AND DIAPERS) ×2 IMPLANT
WATER STERILE IRR 1000ML POUR (IV SOLUTION) ×3 IMPLANT

## 2011-11-23 NOTE — Progress Notes (Signed)
Dr. Ladene Artist notified of CBG orders given and carried out. Patient unsure what she took this morning but thinks she took all of her home medications. State that someone from her [facility] puts her medication together.

## 2011-11-23 NOTE — Transfer of Care (Signed)
Immediate Anesthesia Transfer of Care Note  Patient: [name]  Procedure(s) Performed: Procedure(s) (LRB) with comments: CRANIOTOMY TUMOR EXCISION (N/A) - Craniotomy for tumor resection  Patient Location: PACU  Anesthesia Type: General  Level of Consciousness: awake, alert  and oriented  Airway & Oxygen Therapy: Patient Spontanous Breathing and Patient connected to nasal cannula oxygen  Post-op Assessment: Report given to PACU RN and Post -op Vital signs reviewed and stable  Post vital signs: Reviewed and stable  Complications: No apparent anesthesia complications

## 2011-11-23 NOTE — Anesthesia Preprocedure Evaluation (Addendum)
Anesthesia Evaluation  Patient identified by MRN, date of birth, ID band Patient awake    Reviewed: Allergy & Precautions, H&P , NPO status , Patient's Chart, lab work & pertinent test results  History of Anesthesia Complications Negative for: history of anesthetic complications  Airway Mallampati: I TM Distance: >3 FB Neck ROM: Full    Dental  (+) Edentulous Upper, Edentulous Lower and Dental Advisory Given   Pulmonary asthma , sleep apnea ,    Pulmonary exam normal       Cardiovascular hypertension, Pt. on medications and Pt. on home beta blockers Rhythm:Regular Rate:Normal     Neuro/Psych  Headaches, Seizures -, Well Controlled,  PSYCHIATRIC DISORDERS Anxiety Depression CVA, Residual Symptoms    GI/Hepatic Neg liver ROS, GERD-  Medicated,  Endo/Other  diabetes, Type 2, Oral Hypoglycemic AgentsHypothyroidism   Renal/GU      Musculoskeletal   Abdominal   Peds  Hematology negative hematology ROS (+)   Anesthesia Other Findings   Reproductive/Obstetrics                          Anesthesia Physical Anesthesia Plan  ASA: III  Anesthesia Plan: General   Post-op Pain Management:    Induction: Intravenous  Airway Management Planned: Oral ETT  Additional Equipment: CVP and Arterial line  Intra-op Plan:   Post-operative Plan: Extubation in OR and Possible Post-op intubation/ventilation  Informed Consent: I have reviewed the patients History and Physical, chart, labs and discussed the procedure including the risks, benefits and alternatives for the proposed anesthesia with the patient or authorized representative who has indicated his/her understanding and acceptance.   Dental advisory given  Plan Discussed with: CRNA, Anesthesiologist and Surgeon  Anesthesia Plan Comments:         Anesthesia Quick Evaluation

## 2011-11-23 NOTE — Preoperative (Signed)
Beta Blockers   Reason not to administer Beta Blockers:Not Applicable, taken 

## 2011-11-23 NOTE — OR Nursing (Signed)
Spoke with Dolores Frame and singer re" or IVF infiltration of'about 500cc's" per crna on arrival pacu / hand and forearm aboy 3/4's up are swollen/pitting and slightly unconfortable / elevated on pillows + warm blnkt to same......Marland Kitchenwill monitor.... To remove l brachial aline d/t swelling from IVF and poor functioning despite  Troubleshoo

## 2011-11-23 NOTE — Anesthesia Procedure Notes (Signed)
Procedure Name: Intubation Date/Time: 11/23/2011 7:55 AM Performed by: Nicholos Johns Pre-anesthesia Checklist: Patient identified, Emergency Drugs available, Suction available, Patient being monitored and Timeout performed Patient Re-evaluated:Patient Re-evaluated prior to inductionOxygen Delivery Method: Circle system utilized Preoxygenation: Pre-oxygenation with 100% oxygen Intubation Type: IV induction Ventilation: Mask ventilation without difficulty Laryngoscope Size: Mac and 3 Grade View: Grade I Tube type: Oral Tube size: 7.5 mm Number of attempts: 1 Airway Equipment and Method: Stylet Placement Confirmation: ETT inserted through vocal cords under direct vision,  positive ETCO2 and breath sounds checked- equal and bilateral Secured at: 22 cm Tube secured with: Tape Dental Injury: Teeth and Oropharynx as per pre-operative assessment

## 2011-11-23 NOTE — Progress Notes (Signed)
Subjective: Patient resting comfortably in bed. Mild headache and neck discomfort.  Objective: Vital signs in last 24 hours: Filed Vitals:   11/23/11 1430 11/23/11 1434 11/23/11 1700 11/23/11 1800  BP:   168/66 165/62  Pulse:      Temp: 97 F (36.1 C)   99.5 F (37.5 C)  TempSrc:      Resp:  22 25 26   SpO2:        Intake/Output from previous day:   Intake/Output this shift:    Physical Exam:  Awake and alert, oriented to name, [facility] hospital, [place], and 11/23/2011. Following commands. Speech is fluent. Moving all 4 extremities well. Extraocular movements intact. Facial movements symmetrical.  CBC  Basename 11/23/11 1435  WBC 7.1  HGB 10.3*  HCT 33.0*  PLT 163   BMET  Basename 11/23/11 1435  NA 144  K 4.4  CL 109  CO2 22  GLUCOSE 251*  BUN 13  CREATININE 0.65  CALCIUM 8.5    Studies/Results: Dg Chest Port 1 View  11/23/2011  *RADIOLOGY REPORT*  Clinical Data: 61 year old female status post right IJ placement. Postop.  PORTABLE CHEST - 1 VIEW  Comparison: 10/18/2011 and earlier.  Findings: Portable AP view 1550 hours. Right IJ approach central line tip projects to the level of the level of the cavoatrial junction.  Lower lung volumes.  No pneumothorax.  Cardiac size and mediastinal contours are within normal limits.  Visualized tracheal air column is within normal limits.  Perihilar and basilar atelectasis.  No pulmonary edema, pleural effusion or consolidation evident.  IMPRESSION: 1.  Right IJ central line tip at the lower SVC level. 2.  Low lung volumes with atelectasis.  No pneumothorax.   Original Report Authenticated By: Harley Hallmark, M.D.     Assessment/Plan: Patient doing well following craniotomy. We'll plan on usual home meds with a sip of water, but otherwise n.p.o. continue progress to postoperative recovery.   Hewitt Shorts, MD 11/23/2011, 7:16 PM

## 2011-11-23 NOTE — Anesthesia Postprocedure Evaluation (Signed)
Anesthesia Post Note  Patient: [name]  Procedure(s) Performed: Procedure(s) (LRB): CRANIOTOMY TUMOR EXCISION (N/A)  Anesthesia type: general  Patient location: PACU  Post pain: Pain level controlled  Post assessment: Patient's Cardiovascular Status Stable  Last Vitals:  Filed Vitals:   11/23/11 1945  BP:   Pulse:   Temp: 37.5 C  Resp:     Post vital signs: Reviewed and stable  Level of consciousness: sedated  Complications: No apparent anesthesia complications

## 2011-11-23 NOTE — Op Note (Signed)
11/23/2011  2:52 PM  PATIENT:  [name]  61 y.o. female  PRE-OPERATIVE DIAGNOSIS:  brain tumor, headaches  POST-OPERATIVE DIAGNOSIS:  Brain Tumor, Headaches  PROCEDURE:  Procedure(s): CRANIOTOMY TUMOR EXCISION:  Bicoronal craniotomy and gross total resection of planum sphenoidale meningioma, with microdissection, microsurgical technique, and the operating microscope   SURGEON:  Surgeon(s): Hewitt Shorts, MD Clydene Fake, MD  ASSISTANTS:James Mickeal Skinner, M.D.   ANESTHESIA:   general  EBL:  Total I/O In: 4950 [I.V.:4000; Blood:700; IV Piggyback:250] Out: 1000 [Urine:600; Blood:400]  BLOOD ADMINISTERED:700 CC PRBC (2 units)   COUNT:  Correct per nursing staff   SPECIMEN:  Source of Specimen:  Brain tumor  DICTATION: Patient was brought to the operating room, placed under general endotracheal anesthesia. The patient was placed in a 3 pin [place] head holder and then the frontal region was shaved as needed, and then prepped with Betadine soap and solution and draped in a sterile fashion. The line of incision was infiltrated with local site with epinephrine, and then a sutar (bicoronal) incision was made. Raney cuts were applied for scalp edge is to maintain hemostasis. The scalp flap was reflected anteriorly and then multiple bur holes were made bifrontally. The skull was found to be unusually thick, and the dura was found to be densely adherent to the inner table of the skull. We therefore under direct visualization carefully connected between the 2 parasagittal bur holes, across the midline. And then used a Gigli saw guides, and a Gigli saw to make a other craniotomy cuts. This did require placing another bur hole in the anterior midline. The bone flap was carefully elevated, dissecting the dura overlying this parasagittal sinus from the undersurface of the bone flap. However the remainder the dura had essentially incorporated itself into the inner table of the skull and had been  removed attached to the bone flap. We placed Gelfoam with thrombin over the superior sagittal sinus dura. And we irrigated with saline and identified the points of bleeding were controlled either with Gelfoam with thrombin or bipolar cautery, if needed.   We then began the intradural dissection. The buddy halo self-retaining retractor system was set up, the operating microscope was draped, and brought in the field to provide additional magnification, illumination, and visualization. Using hemoclips we carefully worked away from the anterior most aspect of the falx adjacent to the sagittal sinus, carefully dividing it after placing hemoclips on either side. We worked away from the outer aspect adjacent to this parasagittal sinus to the inner aspect of the falx, maintaining good hemostasis as we divided the anterior attachment of the falx.  Cottonoid patties were placed over the hemispheres anteriorly, and gentle self retraction was placed. We then removed the crista galli to improve our intracranial exposure, we identified the orbital roofs bilaterally, and carefully dissected in the interhemispheric fissure in a anterior to posterior direction. We identified the tumor and began to coagulate and divide its attachment to the floor of the frontal fossa. This led to progressive devascularization of the tumor. We then opened the tumor capsule and obtained biopsies the tumor for permanent pathologic examination. We then began to debulk the tumor centrally using bipolar cautery and suction aspiration. As the tumor mass was debulked we able to gradually dissected from the interspace between the tumor and the cerebral hemispheres, coagulating and dividing small bridging vessels between the brain and tumor. We will to fully mobilize the tumor an additional specimen was saved for pathologic examination. In  the end a gross total resection of the tumor was achieved. Once the tumors resection was completed we establish  hemostasis with the use of bipolar cautery, Gelfoam with thrombin, and Surgicel. The intracranial cavity and tumor resection bed were gently irrigated with saline solution, and hemostasis was confirmed. We then proceeded with closure.  The Gelfoam over this parasagittal sinus was left in place. There was no dura to close and therefore a 6 x 8 cm piece of Dura-Guard was cut to the dural defect and laid over the brain surface. And then we secured the bone flap in place with Lorenz cranial plates. We used 5 bur hole covers so as to secure the bone flap to the skull and so as to provide as good a cosmetic appearance as possible. We then closed the galea with interrupted inverted undyed 0 Vicryl sutures. Skin is closed with surgical staples. The was dressed with Adaptic, sterile gauze, and wrapped with 2 Klings and 1 Kerlex.  Following surgery the patient was reversed from the anesthetic, extubated, and transferred to the recovery room for further care where she was noted to be opening her eyes to voice, and following commands with all 4 extremities (squeezing hands and wiggling toes). Following surgery did have an opportunity to discuss the surgical procedure and the patient's condition in the recovery room with her son and niece.  PLAN OF CARE: Admit to inpatient   PATIENT DISPOSITION:  PACU - hemodynamically stable.   Delay start of Pharmacological VTE agent (>24hrs) due to surgical blood loss or risk of bleeding:  yes

## 2011-11-23 NOTE — H&P (Signed)
Subjective: Patient is a 61 y.o. right-handed black female who is admitted for treatment of subfrontal meningioma. She been evaluated last month because of blackouts and headaches, and an MRI scan revealed a planum sphenoidale meningioma. She explains the headaches are typically frontal location, and have been present for over a year. MRI and CT show a lesion 2.5 cm in diameter, with vasogenic cerebral edema in the adjacent brain, particularly in the inferomedial right frontal lobe. Patient admitted now for craniotomy and resection of tumor.   Patient Active Problem List   Diagnosis Date Noted  . Meningioma 11/19/2011  . Gingivitis, chronic 11/16/2011  . 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 Metabolic encephalopathy 08/03/2011  . Acute renal failure 08/02/2011  . Hemiplegia affecting non-dominant side, post-stroke 08/02/2011  . Poor vision 08/01/2011  . Carpal tunnel syndrome of right wrist 05/23/2011  . DDD (degenerative disc disease), cervical 05/23/2011  . Hand pain, right 05/04/2011  . Knee pain, right 05/04/2011  . Polypharmacy 04/28/2011  . Bipolar disorder 04/28/2011  . Renal insufficiency 04/28/2011  . Altered mental status 04/27/2011  . Constipation 04/13/2011  . Dermatitis 03/17/2011  . Cellulitis 01/27/2011  . Falls frequently 12/12/2010  . Abdominal pain 12/04/2010  . Dysphagia 07/12/2010  . CHEST PAIN UNSPECIFIED 03/29/2010  . RUQ PAIN 03/29/2010  . FLANK PAIN 03/29/2010  . MASTALGIA 01/26/2010  . URINARY URGENCY 12/16/2009  . LOM 10/26/2009  . HEARING LOSS 10/26/2009  . CERVICAL LYMPHADENOPATHY, LEFT 10/18/2009  . GASTROPARESIS 07/07/2009  . ANKLE PAIN 04/23/2009  . LIVER FUNCTION TESTS, ABNORMAL, HX OF 02/22/2009  . HYPERLIPIDEMIA 12/11/2008  . IBS 12/11/2008  . ACROMIOCLAVICULAR SPRAIN AND STRAIN 09/24/2008  . ANEMIA 07/29/2008  . GERD 07/29/2008  . ABDOMINAL BLOATING 07/29/2008  . ABDOMINAL PAIN, GENERALIZED 07/29/2008  . MILK PRODUCTS ALLERGY 07/29/2008  . LEG  PAIN, RIGHT 05/21/2008  . FATIGUE 05/12/2008  . HEADACHE 01/14/2008  . ALLERGY UNSPECIFIED NOT ELSEWHERE CLASSIFIED 01/14/2008  . ABNORMALITY OF GAIT 12/30/2007  . OBESITY 11/21/2007  . PSYCHOTIC D/O W/HALLUCINATIONS CONDS CLASS ELSW 11/03/2007  . INSOMNIA 11/03/2007  . ANEMIA, IRON DEFICIENCY, CHRONIC 06/27/2007  . HYPERTENSION 06/27/2007  . DIABETES MELLITUS 06/19/2007  . BACK PAIN, CHRONIC 06/19/2007  . OSTEOPOROSIS 06/19/2007  . SLEEP APNEA 06/19/2007  . TRIGGER FINGER 04/18/2007  . DIVERTICULOSIS, COLON 11/13/2006   Past Medical History  Diagnosis Date  . Bipolar disorder   . CVA (cerebral infarction)   . Pancreatitis 2006    due to Depakote with normal EUS   . Osteoporosis   . Chronic back pain   . Diabetes mellitus   . Trigger finger   . Anxiety disorder   . Hypertension   . Migraines   . Diverticulosis     TCS 9/08 by Dr. Lina Sar for diarrhea . Bx for micro scopic colitis negative.   . Schatzki's ring     non critical / EGD with ED 8/2011with RMR  . S/P colonoscopy [id number],[id number], 2011    left-sided diverticula, hx of simple adenomas . 2011, random bx negative for microscopic colitis  . Glaucoma   . Allergic rhinitis   . Hypothyroidism     thyroid goiter  . Anemia   . Blood transfusion   . GERD (gastroesophageal reflux disease)   . Stroke     left sided weakness  . Seizures     unknown etiology-on meds-last seizure was 3 yerars ago  . Anxiety   . Depression   . Metabolic encephalopathy 08/03/2011  . Sleep apnea   .  Arthritis   . Gum symptoms     infection on antibiotic    Past Surgical History  Procedure Date  . Abdominal hysterectomy   . Cholecystectomy   . Ovarian cyst removal   . Carpal tunnel release 07/22/04    left/ Dr. Romeo Apple   . Breast reduction surgery   . Bilateral cataract surgery   . Biopsy of thyroid gland 2009  . Surgical excision of 3 tumors from right thigh and right buttock  and left upper thigh 2010  . Back surgery July 2012    . Spine surgery 09/29/2010    dr brooks  . Maloney dilation 12/29/2010    Procedure: Elease Hashimoto DILATION;  Surgeon: Corbin Ade, MD;  Location: [facility];  Service: Endoscopy;  Laterality: N/A;  56, 58,  . Esophagogastroduodenoscopy 12/29/2010    Rourk-Retained food in the esophagus and stomach, small hiatal hernia, status post Maloney dilation of the esophagus    Prescriptions prior to admission  Medication Sig Dispense Refill  . clindamycin (CLEOCIN) 300 MG capsule Take 300 mg by mouth 3 (three) times daily. Started on 11/16/11 and finishes on 11/26/11      . cloNIDine (CATAPRES) 0.3 MG tablet Take 0.3 mg by mouth 3 (three) times daily.      . cyclobenzaprine (FLEXERIL) 10 MG tablet Take 10 mg by mouth daily as needed. spasm      . cycloSPORINE (RESTASIS) 0.05 % ophthalmic emulsion 1 drop 2 (two) times daily.      [name] dexamethasone (DECADRON) 1.5 MG tablet Take 0.75 mg by mouth daily.       [name] glipiZIDE (GLUCOTROL) 10 MG tablet Take 1 tablet (10 mg total) by mouth daily before breakfast.  30 tablet  11  . hydrochlorothiazide (MICROZIDE) 12.5 MG capsule Take 12.5 mg by mouth daily.      [name] lamoTRIgine (LAMICTAL) 100 MG tablet Take 1 tablet (100 mg total) by mouth every morning.  30 tablet  2  . levothyroxine (LEVOXYL) 50 MCG tablet Take 50 mcg by mouth every morning.       [name] LORazepam (ATIVAN) 1 MG tablet Take 0.5 tablets (0.5 mg total) by mouth 3 (three) times daily.  30 tablet    . metFORMIN (GLUCOPHAGE) 1000 MG tablet Take 1 tablet (1,000 mg total) by mouth 2 (two) times daily with a meal.  60 tablet  11  . metoprolol (LOPRESSOR) 50 MG tablet Take 50 mg by mouth 2 (two) times daily.      . mirtazapine (REMERON) 30 MG tablet Take 30 mg by mouth at bedtime.       . pregabalin (LYRICA) 75 MG capsule Take 1 capsule (75 mg total) by mouth 2 (two) times daily.  60 capsule  2  . clopidogrel (PLAVIX) 75 MG tablet Take 75 mg by mouth daily.      . diphenoxylate-atropine (LOMOTIL) 2.5-0.025 MG per tablet  Take 1 tablet by mouth 4 (four) times daily as needed. For loose stools      . ergocalciferol (VITAMIN D2) 50000 UNITS capsule Take 1 capsule (50,000 Units total) by mouth once a week. Patient takes on Sundays  4 capsule  2  . fluticasone (FLONASE) 50 MCG/ACT nasal spray Place 1 spray into the nose daily. Allergies  16 g  3  . meclizine (ANTIVERT) 12.5 MG tablet Take 12.5 mg by mouth 3 (three) times daily as needed. dizziness      . morphine (MSIR) 15 MG tablet Take 15 mg by mouth 2 (two)  times daily as needed. For pain      . nystatin (NYSTOP) 100000 UNIT/GM POWD APPLY TO AFFECTED AREAS TWICE DAILY.  60 g  2  . promethazine (PHENERGAN) 12.5 MG tablet Take 12.5 mg by mouth every 6 (six) hours as needed. For nausea      . RABEprazole (ACIPHEX) 20 MG tablet Take 1 tablet (20 mg total) by mouth daily.  31 tablet  11  . rosuvastatin (CRESTOR) 10 MG tablet Take 10 mg by mouth daily.      . saxagliptin HCl (ONGLYZA) 5 MG TABS tablet Take 5 mg by mouth daily.      . sertraline (ZOLOFT) 100 MG tablet Take 150 mg by mouth at bedtime.       [name] SINGULAIR 10 MG tablet TAKE ONE TABLET DAILY.  30 each  3  . thiothixene (NAVANE) 2 MG capsule Take 2 mg by mouth at bedtime.       [name] tiZANidine (ZANAFLEX) 4 MG tablet Take 4 mg by mouth 3 (three) times daily as needed. For spasms      . traZODone (DESYREL) 50 MG tablet Take 50 mg by mouth at bedtime.      . VOLTAREN 1 % GEL APPLY TWICE DAILY TO AFFECTED AREA(S) AS NEEDED FOR PAIN.  100 g  1  . ZOFRAN 4 MG tablet TAKE 1 TABLET BY MOUTH EVERY 8 HOURS AS NEEDED FOR NAUSEA.  20 each  0   Allergies  Allergen Reactions  . Cephalexin Hives  . Milk-Related Compounds Other (See Comments)    Doesn't agree with stomach.   . Penicillins Hives  . Phenazopyridine Hcl Other (See Comments)    [place]      History  Substance Use Topics  . Smoking status: Current Every Day Smoker -- 0.2 packs/day for 7 years    Types: Cigarettes  . Smokeless tobacco: Not on file   Comment:  started at 24 smoked 6 yrs and stopped. restarted 2 months ago  . Alcohol Use: No     5 cigs/day     Family History  Problem Relation Age of Onset  . Heart attack Mother   . Pneumonia Father   . Kidney failure Father   . Cancer Sister     pancreatic  . Diabetes Brother   . Hypertension Brother   . Colon cancer Neg Hx   . Anesthesia problems Neg Hx   . Hypotension Neg Hx   . Malignant hyperthermia Neg Hx   . Pseudochol deficiency Neg Hx      Review of Systems A comprehensive review of systems was negative.  Objective: Vital signs in last 24 hours: Temp:  [98.5 F (36.9 C)] 98.5 F (36.9 C) (09/12 0621) Pulse Rate:  [74] 74  (09/12 0621) Resp:  [18] 18  (09/12 0621) BP: (119)/(73) 119/73 mmHg (09/12 0621) SpO2:  [96 %] 96 % (09/12 0621)  EXAM: Patient is obese black female in no acute distress. Lungs are clear to auscultation , the patient has symmetrical respiratory excursion. Heart has a regular rate and rhythm normal S1 and S2 no murmur.   Abdomen is soft nontender nondistended bowel sounds are present. Extremity examination shows no clubbing cyanosis or edema. Neurologic examination shows on mental status the patient is awake, alert, fully oriented. Her speech is fluent. She has good comprehension. Cranial nerves show her pupils are equal, round, and reactive to light. Extraocular movements are intact. Facial sensation is intact. Facial movement is symmetrical. Hearing is present. Palatal  movement is symmetrical. Shoulder shrug is symmetrical tongue is midline. Motor examination shows 5 over 5 strength in the upper and lower extremities. There is no drift of the upper extremities. Sensation is intact to pinprick throughout the upper and lower extremities. Reflexes are diminished, but symmetrical. Toes are downgoing.  Data Review:CBC    Component Value Date/Time   WBC 8.4 11/20/2011 1249   RBC 4.86 11/20/2011 1249   HGB 10.6* 11/20/2011 1249   HCT 35.8* 11/20/2011 1249   PLT 205  11/20/2011 1249   MCV 73.7* 11/20/2011 1249   MCH 21.8* 11/20/2011 1249   MCHC 29.6* 11/20/2011 1249   RDW 16.0* 11/20/2011 1249   LYMPHSABS 2.1 10/18/2011 1223   MONOABS 0.6 10/18/2011 1223   EOSABS 0.1 10/18/2011 1223   BASOSABS 0.0 10/18/2011 1223                          BMET    Component Value Date/Time   NA 139 11/20/2011 1249   K 3.5 11/20/2011 1249   CL 99 11/20/2011 1249   CO2 29 11/20/2011 1249   GLUCOSE 181* 11/20/2011 1249   BUN 17 11/20/2011 1249   CREATININE 0.78 11/20/2011 1249   CREATININE 0.75 10/31/2011 1440   CALCIUM 9.7 11/20/2011 1249   GFRNONAA 88* 11/20/2011 1249   GFRAA >90 11/20/2011 1249     Assessment/Plan: Patient with multiple medical comorbidities who is presented with a symptomatic subfrontal meningioma, with symptoms of headaches and blackout. CT and MRI so a lesion 2.5 cm in diameter with associated vasogenic edema. Patient is admitted now for craniotomy and resection of the tumor. I discussed the nature of the procedure, the typical of the surgery, ICU stay, overall hospital stay, and the possibility of requiring rehabilitation postoperatively, potentially in a skilled nursing facility, prior to hopefully returning to her apartment. We discussed risks of surgery with the patient and her niece who is present, including risks of infection, bleeding, possibly for transfusion, the risk of brain dysfunction including altered mental status, paralysis, coma, loss of vision, death, and we discussed anesthetic risks of myocardial infarction, stroke, pneumonia, and death, many of which are increased due to her medical comorbidities. Understanding this the patient and her niece to want to proceed with surgery and she is admitted for such.   Hewitt Shorts, MD 11/23/2011 7:24 AM

## 2011-11-24 LAB — BASIC METABOLIC PANEL
BUN: 11 mg/dL (ref 6–23)
CO2: 25 mEq/L (ref 19–32)
Calcium: 8.6 mg/dL (ref 8.4–10.5)
Chloride: 105 mEq/L (ref 96–112)
Creatinine, Ser: 0.6 mg/dL (ref 0.50–1.10)
GFR calc Af Amer: >90 mL/min (ref >90)
GFR calc non Af Amer: >90 mL/min (ref >90)
Glucose, Bld: 236 mg/dL — ABNORMAL HIGH (ref 70–99)
Potassium: 4 mEq/L (ref 3.5–5.1)
Sodium: 142 mEq/L (ref 135–145)

## 2011-11-24 LAB — TYPE AND SCREEN
ABO/RH(D): O POS
Antibody Screen: NEGATIVE
Unit division: 0
Unit division: 0
Unit division: 0
Unit division: 0

## 2011-11-24 LAB — DIFFERENTIAL
Basophils Absolute: 0 10*3/uL (ref 0.0–0.1)
Basophils Relative: 0 % (ref 0–1)
Eosinophils Absolute: 0 10*3/uL (ref 0.0–0.7)
Eosinophils Relative: 0 % (ref 0–5)
Lymphocytes Relative: 12 % (ref 12–46)
Lymphs Abs: 1.3 10*3/uL (ref 0.7–4.0)
Monocytes Absolute: 1.3 10*3/uL — ABNORMAL HIGH (ref 0.1–1.0)
Monocytes Relative: 12 % (ref 3–12)
Neutro Abs: 8.4 10*3/uL — ABNORMAL HIGH (ref 1.7–7.7)
Neutrophils Relative %: 76 % (ref 43–77)

## 2011-11-24 LAB — GLUCOSE, CAPILLARY
Glucose-Capillary: 102 mg/dL — ABNORMAL HIGH (ref 70–99)
Glucose-Capillary: 184 mg/dL — ABNORMAL HIGH (ref 70–99)
Glucose-Capillary: 225 mg/dL — ABNORMAL HIGH (ref 70–99)
Glucose-Capillary: 181 mg/dL — ABNORMAL HIGH (ref 70–99)
Glucose-Capillary: 139 mg/dL — ABNORMAL HIGH (ref 70–99)
Glucose-Capillary: 130 mg/dL — ABNORMAL HIGH (ref 70–99)
Glucose-Capillary: 176 mg/dL — ABNORMAL HIGH (ref 70–99)

## 2011-11-24 LAB — CBC
HCT: 32.1 % — ABNORMAL LOW (ref 36.0–46.0)
Hemoglobin: 10.3 g/dL — ABNORMAL LOW (ref 12.0–15.0)
MCH: 23.8 pg — ABNORMAL LOW (ref 26.0–34.0)
MCHC: 32.1 g/dL (ref 30.0–36.0)
MCV: 74.3 fL — ABNORMAL LOW (ref 78.0–100.0)
Platelets: 168 10*3/uL (ref 150–400)
RBC: 4.32 MIL/uL (ref 3.87–5.11)
RDW: 17.2 % — ABNORMAL HIGH (ref 11.5–15.5)
WBC: 11 10*3/uL — ABNORMAL HIGH (ref 4.0–10.5)

## 2011-11-24 MED ORDER — DEXAMETHASONE SODIUM PHOSPHATE 4 MG/ML IJ SOLN
4.0000 mg | Freq: Three times a day (TID) | INTRAMUSCULAR | Status: AC
  Administered 2011-11-25 (×3): 4 mg via INTRAVENOUS
  Filled 2011-11-24 (×3): qty 1

## 2011-11-24 MED ORDER — DEXAMETHASONE SODIUM PHOSPHATE 4 MG/ML IJ SOLN
4.0000 mg | Freq: Two times a day (BID) | INTRAMUSCULAR | Status: DC
  Administered 2011-11-26 – 2011-11-27 (×2): 4 mg via INTRAVENOUS
  Filled 2011-11-24 (×5): qty 1

## 2011-11-24 MED ORDER — HYDROCODONE-ACETAMINOPHEN 5-325 MG PO TABS
1.0000 | ORAL_TABLET | ORAL | Status: DC | PRN
  Administered 2011-11-24 – 2011-11-26 (×9): 2 via ORAL
  Administered 2011-11-27: 1 via ORAL
  Administered 2011-11-27 (×2): 2 via ORAL
  Administered 2011-11-27: 1 via ORAL
  Administered 2011-11-28: 2 via ORAL
  Administered 2011-11-28 – 2011-11-30 (×6): 1 via ORAL
  Filled 2011-11-24: qty 1
  Filled 2011-11-24 (×4): qty 2
  Filled 2011-11-24: qty 1
  Filled 2011-11-24 (×3): qty 2
  Filled 2011-11-24: qty 1
  Filled 2011-11-24 (×2): qty 2
  Filled 2011-11-24 (×2): qty 1
  Filled 2011-11-24 (×5): qty 2
  Filled 2011-11-24: qty 1

## 2011-11-24 MED ORDER — INSULIN ASPART 100 UNIT/ML ~~LOC~~ SOLN: 0.0000 [IU] | Freq: Every day | SUBCUTANEOUS | Status: DC

## 2011-11-24 MED ORDER — ENSURE COMPLETE PO LIQD
237.0000 mL | Freq: Two times a day (BID) | ORAL | Status: DC
  Administered 2011-11-25 (×2): 237 mL via ORAL

## 2011-11-24 MED ORDER — PANTOPRAZOLE SODIUM 40 MG PO TBEC
40.0000 mg | DELAYED_RELEASE_TABLET | Freq: Every day | ORAL | Status: DC
  Administered 2011-11-24 – 2011-12-01 (×8): 40 mg via ORAL
  Filled 2011-11-24 (×8): qty 1

## 2011-11-24 MED ORDER — INSULIN ASPART 100 UNIT/ML ~~LOC~~ SOLN
0.0000 [IU] | Freq: Three times a day (TID) | SUBCUTANEOUS | Status: DC
  Administered 2011-11-24: 3 [IU] via SUBCUTANEOUS
  Administered 2011-11-24: 2 [IU] via SUBCUTANEOUS
  Administered 2011-11-24 – 2011-11-25 (×2): 3 [IU] via SUBCUTANEOUS
  Administered 2011-11-25 – 2011-11-27 (×2): 2 [IU] via SUBCUTANEOUS

## 2011-11-24 NOTE — Progress Notes (Signed)
UR COMPLETED  

## 2011-11-24 NOTE — Clinical Documentation Improvement (Signed)
Abnormal Findings Clarification  THIS DOCUMENT IS NOT A PERMANENT PART OF THE MEDICAL RECORD   Dear Dr.Nudelman,  In a better effort to capture your patient's severity of illness, reflect appropriate length of stay and utilization of resources, a review of the patient medical record has revealed the following indicators.   Based on your clinical judgment, please clarify and document in a progress note and/or discharge summary the clinical condition associated with the following supporting information: In responding to this query please exercise your independent judgment.  The fact that a query is asked, does not imply that any particular answer is desired or expected.   Hello Dr. [name]!  Abnormal findings (CT, MRI, laboratory, x-ray, pathologic, and other diagnostic results) are not coded and reported unless the physician indicates their clinical significance. The medical record reflects the following clinical findings, please clarify the diagnostic and/or clinical significance:   [name] was admitted with a subfrontal meningioma. Thank you for including that the MRI and CT show vasogenic cerebral edema. If possible, please help by clarifying if you agree with the MRI and CT findings of "vasogenic cerebral edema". Thank you!    Reviewed: additional documentation in the medical record   Thank You,  Saul Fordyce  Clinical Documentation Specialist: [phone] Pager  [facility] [place] [facility]

## 2011-11-24 NOTE — Telephone Encounter (Signed)
Faxed to [facility]

## 2011-11-24 NOTE — Clinical Documentation Improvement (Signed)
Anemia Blood Loss Clarification  THIS DOCUMENT IS NOT A PERMANENT PART OF THE MEDICAL RECORD        11/24/11  Dear Dr. [name],  In an effort to better capture your patient's severity of illness, reflect appropriate length of stay and utilization of resources, a review of the patient medical record has revealed the following indicators.   Based on your clinical judgment, please clarify and document in a progress note and/or discharge summary the clinical condition associated with the following supporting information: In responding to this query please exercise your independent judgment.  The fact that a query is asked, does not imply that any particular answer is desired or expected.     Hello Dr. [name]!  Selah underwent a crainiotomy for tumor excision on 9/12. EBL for the surgery = ; Hemoglobin dropped from 10.6 to 7.8 and Cariann received PRBCs ( ) during surgery. If possible, please help clarify the suspected diagnosis supporting these findings and measures taken. Thank you!  Possible Clinical Conditions?  - Expected IntraOperative Acute Blood Loss Anemia  - Intraoperative Acute Blood Loss Anemia  - Other condition (please document in the progress notes and/or discharge summary)  - Cannot Clinically determine at this time    Additional Supporting Information:  Component      Hemoglobin  Latest Ref Rng      12.0 - 15.0 g/dL  03/18/1094      04.5 (L)  11/23/2011     10:50 AM 7.8 (L)  11/23/2011     11:55 AM 7.8 (L)  11/23/2011     12:39 PM 10.5 (L)  11/23/2011     1:25 PM 10.5 (L)  11/23/2011     2:35 PM 10.3 (L)  11/24/2011      10.3 (L)   Component      HCT  Latest Ref Rng      36.0 - 46.0 %  11/20/2011      35.8 (L)  11/23/2011     10:50 AM 23.0 (L)  11/23/2011     11:55 AM 23.0 (L)  11/23/2011     12:39 PM 31.0 (L)  11/23/2011     1:25 PM 31.0 (L)  11/23/2011     2:35 PM 33.0 (L)  11/24/2011      32.1 (L)       Reviewed: additional  documentation in the medical record   Thank Darden Palmer  Clinical Documentation Specialist: [phone] Pager  Health Information Management [place]

## 2011-11-24 NOTE — Telephone Encounter (Signed)
Script and note written, pls fax

## 2011-11-24 NOTE — Telephone Encounter (Signed)
I checked the chart and the only thing scanned in was from Aging and Disability that you signed stating that a hospital bed was justified. I put a copy in your box

## 2011-11-24 NOTE — Progress Notes (Signed)
Subjective: Patient resting in bed comfortably. Accu-Cheks have ranged from 102-225.  Objective: Vital signs in last 24 hours: Filed Vitals:   11/24/11 0330 11/24/11 0400 11/24/11 0500 11/24/11 0600  BP: 159/86 157/66 157/77 159/69  Pulse: 85 85 76 80  Temp: 99.5 F (37.5 C) 99.3 F (37.4 C) 99.7 F (37.6 C) 99.9 F (37.7 C)  TempSrc:  Core (Comment) Core (Comment) Core (Comment)  Resp: 26 31 27 26   Height:      Weight:      SpO2: 99% 99% 100% 99%    Intake/Output from previous day: 09/12 0701 - 09/13 0700 In: 6198.3 [I.V.:5248.3; Blood:700; IV Piggyback:250] Out: 2595 [Urine:2195; Blood:400] Intake/Output this shift:    Physical Exam:   Patient awake alert, oriented to name, hospital, and 11/24/2011. Mild confusion, asking to be able to wash her clothes that she came in to the hospital with. Following commands. Speech fluent. Moving all 4 extremities well. No drift of upper extremities. Extraocular movements intact. Facial movements symmetrical. Dressing clean and dry.  CBC  Basename 11/24/11 0500 11/23/11 1435  WBC 11.0* 7.1  HGB 10.3* 10.3*  HCT 32.1* 33.0*  PLT 168 163   BMET  Basename 11/24/11 0500 11/23/11 1435  NA 142 144  K 4.0 4.4  CL 105 109  CO2 25 22  GLUCOSE 236* 251*  BUN 11 13  CREATININE 0.60 0.65  CALCIUM 8.6 8.5    Assessment/Plan: Doing well following surgery, we'll progress to postoperative recovery. DC Foley. Carb modified diet; once taking well by mouth, decrease of IVF to 30 cc per hour. Out of bed to chair, progressive ambulation in ICU. Change sliding-scale insulin from every 4 hours to AC/HS. Decadron to be gradually tapered over the weekend, orders written.    Hewitt Shorts, MD 11/24/2011, 7:31 AM

## 2011-11-24 NOTE — Progress Notes (Signed)
INITIAL ADULT NUTRITION ASSESSMENT Date: 11/24/2011   Time: 12:21 PM Reason for Assessment: MST  INTERVENTION: Ensure Complete po BID, each supplement provides 350 kcal and 13 grams of protein.   ASSESSMENT: Female 61 y.o.  Dx: Subfrontal Meningioma  Hx:  Past Medical History  Diagnosis Date  . Bipolar disorder   . CVA (cerebral infarction)   . Pancreatitis 2006    due to Depakote with normal EUS   . Osteoporosis   . Chronic back pain   . Diabetes mellitus   . Trigger finger   . Anxiety disorder   . Hypertension   . Migraines   . Diverticulosis     TCS 9/08 by Dr. Lina Sar for diarrhea . Bx for micro scopic colitis negative.   . Schatzki's ring     non critical / EGD with ED 8/2011with RMR  . S/P colonoscopy 1610,9604, 2011    left-sided diverticula, hx of simple adenomas . 2011, random bx negative for microscopic colitis  . Glaucoma   . Allergic rhinitis   . Hypothyroidism     thyroid goiter  . Anemia   . Blood transfusion   . GERD (gastroesophageal reflux disease)   . Stroke     left sided weakness  . Seizures     unknown etiology-on meds-last seizure was 3 yerars ago  . Anxiety   . Depression   . Metabolic encephalopathy 08/03/2011  . Sleep apnea   . Arthritis   . Gum symptoms     infection on antibiotic   Past Surgical History  Procedure Date  . Abdominal hysterectomy   . Cholecystectomy   . Ovarian cyst removal   . Carpal tunnel release 07/22/04    left/ Dr. Romeo Apple   . Breast reduction surgery   . Bilateral cataract surgery   . Biopsy of thyroid gland 2009  . Surgical excision of 3 tumors from right thigh and right buttock  and left upper thigh 2010  . Back surgery July 2012  . Spine surgery 09/29/2010    dr brooks  . Maloney dilation 12/29/2010    Procedure: Elease Hashimoto DILATION;  Surgeon: Corbin Ade, MD;  Location: [facility];  Service: Endoscopy;  Laterality: N/A;  56, 58,  . Esophagogastroduodenoscopy 12/29/2010    Rourk-Retained food in  the esophagus and stomach, small hiatal hernia, status post Maloney dilation of the esophagus    Related Meds:     . atorvastatin  20 mg Oral q1800  . bacitracin      . clindamycin  300 mg Oral TID  . cloNIDine  0.3 mg Oral TID  . cycloSPORINE  1 drop Both Eyes BID  . dexamethasone  4 mg Intravenous Q6H  . dexamethasone  4 mg Intravenous Q8H  . dexamethasone  4 mg Intravenous Q12H  . dexamethasone  8 mg Intravenous To NeurOR  . glipiZIDE  10 mg Oral QAC breakfast  . hydrochlorothiazide  12.5 mg Oral Daily  . insulin aspart  0-15 Units Subcutaneous TID WC  . insulin aspart  0-5 Units Subcutaneous QHS  . lamoTRIgine  100 mg Oral Daily  . levothyroxine  50 mcg Oral BH-q7a  . linagliptin  5 mg Oral Daily  . LORazepam  0.5 mg Oral TID  . metFORMIN  1,000 mg Oral BID WC  . metoprolol  50 mg Oral BID  . mirtazapine  30 mg Oral QHS  . montelukast  10 mg Oral Daily  . pantoprazole  40 mg Oral Q1200  . pregabalin  75 mg Oral BID  . sertraline  150 mg Oral QHS  . sodium bicarbonate  100 mEq Intravenous To NeurOR  . sodium chloride      . thiothixene  2 mg Oral QHS  . traZODone  50 mg Oral QHS  . DISCONTD: dexamethasone  4 mg Intravenous Once  . DISCONTD: insulin aspart  0-15 Units Subcutaneous Q4H  . DISCONTD: pantoprazole (PROTONIX) IV  40 mg Intravenous QHS   Ht: 5\' 2"  (157.5 cm)  Wt: 164 lb 0.4 oz (74.4 kg)  Ideal Wt: 50 kg % Ideal Wt: 149%  Usual Wt:  Wt Readings from Last 10 Encounters:  11/23/11 164 lb 0.4 oz (74.4 kg)  11/23/11 164 lb 0.4 oz (74.4 kg)  11/20/11 153 lb 8 oz (69.627 kg)  11/16/11 152 lb (68.947 kg)  10/25/11 152 lb (68.947 kg)  10/18/11 152 lb (68.947 kg)  08/17/11 154 lb (69.854 kg)  08/03/11 154 lb 15.7 oz (70.3 kg)  08/01/11 154 lb 9.6 oz (70.126 kg)  06/28/11 159 lb (72.122 kg)   % Usual Wt: -  Body mass index is 30.00 kg/(m^2). Obesity Class I  Food/Nutrition Related Hx: Pt reports 10 lbs weight loss to RN on admission.  Labs:  CMP       Component Value Date/Time   NA 142 11/24/2011 0500   K 4.0 11/24/2011 0500   CL 105 11/24/2011 0500   CO2 25 11/24/2011 0500   GLUCOSE 236* 11/24/2011 0500   BUN 11 11/24/2011 0500   CREATININE 0.60 11/24/2011 0500   CREATININE 0.75 10/31/2011 1440   CALCIUM 8.6 11/24/2011 0500   PROT 6.4 10/31/2011 1440   ALBUMIN 3.7 10/31/2011 1440   AST 14 10/31/2011 1440   ALT 25 10/31/2011 1440   ALKPHOS 110 10/31/2011 1440   BILITOT 0.2* 10/31/2011 1440   GFRNONAA >90 11/24/2011 0500   GFRAA >90 11/24/2011 0500   CBG (last 3)   Basename 11/24/11 1233 11/24/11 0754 11/24/11 0355  GLUCAP 139* 181* 225*    Intake/Output Summary (Last 24 hours) at 11/24/11 1432 Last data filed at 11/24/11 1359  Gross per 24 hour  Intake 1708.33 ml  Output   2290 ml  Net -581.67 ml     Diet Order: CHO Modified Medium  Supplements/Tube Feeding: none  IVF:    0.9 % NaCl with KCl 20 mEq / L Last Rate: 30 mL/hr at 11/24/11 1000  DISCONTD: sodium chloride Last Rate: 20 mL/hr at 11/23/11 5621  DISCONTD: sodium chloride    Pt admitted for subfrontal meningioma. She is POD # 1 s/p craniotomy with tumor excision. Pt is confused at this time. She reports that her usual weight is 178 lbs and that she last weighed that in 1941. It is likely that she has had weight loss due to symptoms PTA. Pt lives alone but does have some help from an aide. Per RN plans for SNF at d/c. Pt reports that she likes chocolate Glucerna and drinks some at home. Pt is willing to try ensure while she is here. Per RN pt consumed only about 1/2 of her Breakfast this am.   Estimated Nutritional Needs:   Kcal:  1500-1700 Protein:  75-85 grams Fluid:  >1.5 L/day  NUTRITION DIAGNOSIS: -Inadequate oral intake (NI-2.1).  Status: Ongoing  RELATED TO: decreased appetite  AS EVIDENCE BY: meal completion </= 50%  MONITORING/EVALUATION(Goals): Goal: Pt to meet >/= 90% of their estimated nutrition needs. Monitor: PO intake, supplement acceptance.    EDUCATION NEEDS: -No  education needs identified at this time    DOCUMENTATION CODES Per approved criteria  -Obesity Unspecified    Kendell Bane RD, LDN, CNSC [phone] Pager [phone] After Hours Pager  11/24/2011, 12:21 PM

## 2011-11-24 NOTE — Clinical Documentation Improvement (Signed)
Abnormal Labs Clarification  THIS DOCUMENT IS NOT A PERMANENT PART OF THE MEDICAL RECORD  Please update your documentation within the medical record to reflect your response to this query.                                                                                   11/24/11  Dear Dr.Nudelman,  In a better effort to capture your patient's severity of illness, reflect appropriate length of stay and utilization of resources, a review of the medical record has revealed the following indicators.   Based on your clinical judgment, please clarify and document in a progress note and/or discharge summary the clinical condition associated with the following supporting information: In responding to this query please exercise your independent judgment.  The fact that a query is asked, does not imply that any particular answer is desired or expected.   Hello Dr.Nudelman,  Abnormal findings (laboratory, x-ray, pathologic, and other diagnostic results) are not coded and reported unless the physician indicates their clinical significance.   The medical record reflects the following clinical findings, please clarify the diagnostic and/or clinical significance:       Component      Potassium  Latest Ref Rng      3.5 - 5.1 mEq/L  11/20/2011      3.5  11/23/2011     10:50 AM 2.9 (L)  11/23/2011     11:40 AM   11/23/2011     11:55 AM 4.2   - potassium chloride 10 mEq in 50 mL *CENTRAL LINE* IVPB every 1 hour x 3 ordered on 9/12   Possible Clinical Conditions?  - Hypokalemia  - Other condition (please document in the progress notes and/or discharge summary)  - Cannot Clinically determine at this time       Reviewed: additional documentation in the medical record   Thank You,  Saul Fordyce  Clinical Documentation Specialist: [phone] Pager  Health Information Management [place]

## 2011-11-25 LAB — GLUCOSE, CAPILLARY
Glucose-Capillary: 143 mg/dL — ABNORMAL HIGH (ref 70–99)
Glucose-Capillary: 118 mg/dL — ABNORMAL HIGH (ref 70–99)
Glucose-Capillary: 134 mg/dL — ABNORMAL HIGH (ref 70–99)
Glucose-Capillary: 75 mg/dL (ref 70–99)
Glucose-Capillary: 121 mg/dL — ABNORMAL HIGH (ref 70–99)

## 2011-11-25 NOTE — Progress Notes (Signed)
Patient ID: [name], female   DOB: May 13, 1950, 61 y.o.   MRN: [id number] Patient appears to be doing well. She does describe some headache. She has been ambulated around the unit. She moves all extremities well. Dressing dry. Arouses from sleep easily and follows commands.

## 2011-11-26 LAB — GLUCOSE, CAPILLARY
Glucose-Capillary: 118 mg/dL — ABNORMAL HIGH (ref 70–99)
Glucose-Capillary: 126 mg/dL — ABNORMAL HIGH (ref 70–99)
Glucose-Capillary: 49 mg/dL — ABNORMAL LOW (ref 70–99)
Glucose-Capillary: 54 mg/dL — ABNORMAL LOW (ref 70–99)
Glucose-Capillary: 54 mg/dL — ABNORMAL LOW (ref 70–99)
Glucose-Capillary: 53 mg/dL — ABNORMAL LOW (ref 70–99)
Glucose-Capillary: 54 mg/dL — ABNORMAL LOW (ref 70–99)
Glucose-Capillary: 94 mg/dL (ref 70–99)
Glucose-Capillary: 115 mg/dL — ABNORMAL HIGH (ref 70–99)

## 2011-11-26 MED ORDER — GLUCOSE 40 % PO GEL
ORAL | Status: AC
  Filled 2011-11-26: qty 1

## 2011-11-26 MED ORDER — GLUCOSE 40 % PO GEL
1.0000 | Freq: Once | ORAL | Status: AC
  Administered 2011-11-26: 37.5 g via ORAL
  Filled 2011-11-26: qty 1.65

## 2011-11-26 MED ORDER — GLUCOSE-VITAMIN C 4-6 GM-MG PO CHEW
CHEWABLE_TABLET | ORAL | Status: AC
  Administered 2011-11-26: 3
  Filled 2011-11-26: qty 1

## 2011-11-26 NOTE — Progress Notes (Signed)
Patient ID: [name], female   DOB: [birth year], 61 y.o.   MRN: [id number] Patient looks pretty good this morning. Her incision looks good. She is awake and pleasant and conversant. Maybe some disorientation. Some swelling around her eyes. EOMI. Move all extremities and follows commands. Please with her progress.

## 2011-11-26 NOTE — Progress Notes (Addendum)
Hypoglycemic Event  CBG: 49  Treatment: Glucose gel    Symptoms:   None  Follow-up CBG: Time:1645 CBG Result:54  Possible Reasons for Event:  Oral diabetic dosage more than required during hospitalization  Comments/MD notified:  Pt.  Alert and oriented.  Will monitor CBG after eating.  Has had poor P.O. Intake today.    [name]   1800h.  Pt. Given Glucose gel.  Refused Ensure.  Affect pleasant.  Oriented x 4 and alert.  No diaphoresis or tremors noted.  F/U CBG = 54.  States wanted to eat supper.  Pt. Is receiving 1000 mg. Of Metformin po BID.  Recommend re-eval of dosage while she is an inpatient.  Remember to initiate Hypoglycemia Order Set & complete

## 2011-11-27 ENCOUNTER — Encounter (HOSPITAL_COMMUNITY): Payer: Self-pay | Admitting: Neurosurgery

## 2011-11-27 LAB — GLUCOSE, CAPILLARY
Glucose-Capillary: 38 mg/dL — CL (ref 70–99)
Glucose-Capillary: 79 mg/dL (ref 70–99)
Glucose-Capillary: 129 mg/dL — ABNORMAL HIGH (ref 70–99)
Glucose-Capillary: 136 mg/dL — ABNORMAL HIGH (ref 70–99)
Glucose-Capillary: 163 mg/dL — ABNORMAL HIGH (ref 70–99)
Glucose-Capillary: 116 mg/dL — ABNORMAL HIGH (ref 70–99)
Glucose-Capillary: 117 mg/dL — ABNORMAL HIGH (ref 70–99)

## 2011-11-27 MED ORDER — DEXAMETHASONE 0.75 MG PO TABS
0.7500 mg | ORAL_TABLET | Freq: Two times a day (BID) | ORAL | Status: DC
  Administered 2011-11-29 – 2011-11-30 (×2): 0.75 mg via ORAL
  Filled 2011-11-27 (×4): qty 1

## 2011-11-27 MED ORDER — DEXAMETHASONE 2 MG PO TABS
2.0000 mg | ORAL_TABLET | Freq: Two times a day (BID) | ORAL | Status: AC
  Administered 2011-11-27 – 2011-11-29 (×4): 2 mg via ORAL
  Filled 2011-11-27 (×4): qty 1

## 2011-11-27 NOTE — Progress Notes (Addendum)
Hypoglycemic Event  CBG: 54  Treatment: 3 glucose tabs  Symptoms: None  Follow-up CBG: Time:2030 CBG Result:94  Possible Reasons for Event: Inadequate meal intake  Comments/MD notified: Pt is alert and oriented. Will continue to monitor.    Becky Augusta Dante  Remember to initiate Hypoglycemia Order Set & complete

## 2011-11-27 NOTE — Progress Notes (Signed)
Subjective: Patient resting in bed comfortably. Has been up and out of bed, and ambulating in ICU. Had a hypoglycemic episode last evening, treated.  Objective: Vital signs in last 24 hours: Filed Vitals:   11/27/11 0500 11/27/11 0600 11/27/11 0700 11/27/11 0800  BP: 137/67 136/60 119/61 141/77  Pulse:      Temp:      TempSrc:      Resp: 19 20 16 17   Height:      Weight:      SpO2: 100% 100%  98%    Intake/Output from previous day: 09/15 0701 - 09/16 0700 In: 2020 [P.O.:1390; I.V.:630] Out: 1958 [Urine:1950; Stool:8] Intake/Output this shift: Total I/O In: 230 [P.O.:200; I.V.:30] Out: -   Physical Exam:   Awake and alert, oriented, extraocular movements intact, facial movements symmetrical. Moving all 4 extremity is well. Wound clean and dry.   Assessment/Plan: Doing well following surgery. We'll continue to taper Decadron; we'll discontinue sliding-scale insulin since Accu-Cheks have been less than 150 for over two days, but will continue Accu-Cheks a.c. and at bedtime because of hypoglycemia. Encouraged to ambulate. We'll consult social work regarding [place], ideally a facility that her primary physician Dr. Syliva Overman covers, possibly [facility] skilled nursing facility.  We'll transfer to [address]  we'll DC central line prior to transfer.    Hewitt Shorts, MD 11/27/2011, 9:22 AM

## 2011-11-27 NOTE — Progress Notes (Signed)
Inpatient Diabetes Program Recommendations  AACE/ADA: New Consensus Statement on Inpatient Glycemic Control (2013)  Target Ranges:  Prepandial:   less than 140 mg/dL      Peak postprandial:   less than 180 mg/dL (1-2 hours)      Critically ill patients:  140 - 180 mg/dL   Reason for Visit: Hypoglycemia  Inpatient Diabetes Program Recommendations Oral Agents: Please discontinue Glipizide while here as po intake is too variable.    Note: Thank you, Lenor Coffin, RN, CNS, Diabetes Coordinator [phone])

## 2011-11-28 LAB — GLUCOSE, CAPILLARY
Glucose-Capillary: 114 mg/dL — ABNORMAL HIGH (ref 70–99)
Glucose-Capillary: 129 mg/dL — ABNORMAL HIGH (ref 70–99)
Glucose-Capillary: 97 mg/dL (ref 70–99)
Glucose-Capillary: 174 mg/dL — ABNORMAL HIGH (ref 70–99)

## 2011-11-28 NOTE — Progress Notes (Signed)
Filed Vitals:   11/27/11 2200 11/28/11 0200 11/28/11 0600 11/28/11 0924  BP: 137/43 108/43 127/57 128/59  Pulse: 65 57 58 69  Temp: 99 F (37.2 C) 98.3 F (36.8 C) 98.2 F (36.8 C) 99.3 F (37.4 C)  TempSrc: Oral Oral Oral Oral  Resp: 18 18 18 20   Height:      Weight:      SpO2: 96% 99% 98% 100%    Patient with mild headache. Has been up and ambulating. Accu-Cheks less than 120 over past 12 hours. Glucotrol held yesterday. Recommendations from diabetes coordinator is to discontinue Glucotrol. Awaiting social work consult. We'll consult PT and OT in anticipation of referral to SNF.  Plan: Discontinue Glucotrol, continue to monitor Accu-Cheks. FL2 completed.  Hewitt Shorts, MD 11/28/2011, 9:45 AM

## 2011-11-28 NOTE — Clinical Social Work Placement (Addendum)
    Clinical Social Work Department CLINICAL SOCIAL WORK PLACEMENT NOTE 11/28/2011  Patient:  ISABELL, ORDERS  Account Number:  [id number] Admit date:  11/23/2011  Clinical Social Worker:  Peggyann Shoals  Date/time:  11/28/2011 05:08 PM  Clinical Social Work is seeking post-discharge placement for this patient at the following level of care:   SKILLED NURSING   (*CSW will update this form in Epic as items are completed)   11/28/2011  Patient/family provided with [facility] Department of Clinical Social Work's list of facilities offering this level of care within the geographic area requested by the patient (or if unable, by the patient's family).  11/28/2011  Patient/family informed of their freedom to choose among providers that offer the needed level of care, that participate in Medicare, Medicaid or managed care program needed by the patient, have an available bed and are willing to accept the patient.  11/28/2011  Patient/family informed of [facility]' ownership interest in [facility], as well as of the fact that they are under no obligation to receive care at this facility.  PASARR submitted to EDS on 11/28/2011 PASARR number received from EDS on 11/29/2011  FL2 transmitted to all facilities in geographic area requested by pt/family on  11/28/2011 FL2 transmitted to all facilities within larger geographic area on   Patient informed that his/her managed care company has contracts with or will negotiate with  certain facilities, including the following:     Patient/family informed of bed offers received:  11/30/2011 Patient chooses bed at [facility] Physician recommends and patient chooses bed at SNF  Patient to be transferred to [facility] Patient to be transferred to facility by [facility]  The following physician request were entered in Epic:   Additional Comments:

## 2011-11-28 NOTE — Evaluation (Signed)
Physical Therapy Evaluation Patient Details Name: [name] MRN: [id number] DOB: 1950/11/23 Today's Date: 11/28/2011 Time: 4098-1191 PT Time Calculation (min): 22 min  PT Assessment / Plan / Recommendation Clinical Impression  [name] is 61 y/o female s/p frontal tumor resection. Presents to PT today with generalized weakness and balance deficits affecting pt's independence with mobility. Will benefit physical therapy in the acute setting to maximize functional mobility for decreased burden of care at next facility and accelerate pt's d/c home.  Agree with SNF for d/c as pt with limited support at home and due to her weakness would not be able to handle ADLs alone.     PT Assessment  Patient needs continued PT services    Follow Up Recommendations  Skilled nursing facility    Barriers to Discharge Decreased caregiver support      Equipment Recommendations  Defer to next venue    Recommendations for Other Services OT consult   Frequency Min 4X/week    Precautions / Restrictions Precautions Precautions: Fall         Mobility  Bed Mobility Bed Mobility: Supine to Sit Supine to Sit: 5: Supervision Details for Bed Mobility Assistance: very slow  but not physical assist needed Transfers Transfers: Sit to Stand;Stand to Sit Sit to Stand: 4: Min assist;With upper extremity assist;From bed Stand to Sit: 4: Min guard;With upper extremity assist;To chair/3-in-1;To bed Details for Transfer Assistance: minA for stability assist, cues for safety with transfers specifically hand placement  Ambulation/Gait Ambulation/Gait Assistance: 4: Min guard Ambulation Distance (Feet): 60 Feet Assistive device: None Ambulation/Gait Assistance Details: minguaurdA secondary to significantly slow gait speed, pt also fatigues very quickly and as she fatigues her step length decreases  Gait Pattern: Decreased step length - left;Decreased step length - right;Shuffle Gait velocity: very slow General  Gait Details: narrow BOS, decreased step height and length bilaterally that decrease further with fatigue Stairs: No    Exercises     PT Diagnosis: Difficulty walking;Abnormality of gait;Generalized weakness;Acute pain  PT Problem List: Decreased strength;Decreased activity tolerance;Decreased balance;Decreased mobility;Pain;Decreased safety awareness PT Treatment Interventions: DME instruction;Gait training;Functional mobility training;Therapeutic activities;Therapeutic exercise;Balance training;Neuromuscular re-education;Patient/family education;Cognitive remediation   PT Goals Acute Rehab PT Goals PT Goal Formulation: With patient Time For Goal Achievement: 12/05/11 Potential to Achieve Goals: Good Pt will go Sit to Stand: with modified independence PT Goal: Sit to Stand - Progress: Goal set today Pt will go Stand to Sit: with modified independence PT Goal: Stand to Sit - Progress: Goal set today Pt will Transfer Bed to Chair/Chair to Bed: with modified independence PT Transfer Goal: Bed to Chair/Chair to Bed - Progress: Goal set today Pt will Ambulate: >150 feet;with modified independence PT Goal: Ambulate - Progress: Goal set today Pt will Perform Home Exercise Program: Independently PT Goal: Perform Home Exercise Program - Progress: Goal set today Additional Goals Additional Goal #1: Pt will be able to complete berg balance test to futher assess balance.  PT Goal: Additional Goal #1 - Progress: Goal set today  Visit Information  Last PT Received On: 11/28/11 Assistance Needed: +1    Subjective Data  Subjective: I have gotten up to the bathroom.   Prior Functioning  Home Living Lives With: Alone Available Help at Discharge: Neighbor;Available PRN/intermittently Type of Home: Apartment Home Access: Level entry Home Layout: One level Bathroom Shower/Tub: Engineer, manufacturing systems: Standard Home Adaptive Equipment: Straight cane;Walker - rolling Prior  Function Level of Independence: Needs assistance Needs Assistance: Light Housekeeping;Meal Prep Meal Prep:  Minimal Light Housekeeping: Minimal Driving: No Vocation: On disability Communication Communication: No difficulties    Cognition  Overall Cognitive Status: Impaired Area of Impairment: Attention;Executive functioning;Following commands Arousal/Alertness: Lethargic Behavior During Session: Flat affect Current Attention Level: Selective;Sustained Following Commands: Follows one step commands consistently;Follows multi-step commands with increased time Executive Functioning: does not initiate conversation but responds with multiple word answers, did smile one time in the session Cognition - Other Comments: slow reaction time    Extremity/Trunk Assessment Right Upper Extremity Assessment RUE ROM/Strength/Tone: Deficits RUE ROM/Strength/Tone Deficits: grossly 4/5 RUE Sensation: Deficits RUE Sensation Deficits: reports tingling in her right hand Left Upper Extremity Assessment LUE ROM/Strength/Tone: Deficits LUE ROM/Strength/Tone Deficits: grossly 4-/5 Right Lower Extremity Assessment RLE ROM/Strength/Tone: Deficits RLE ROM/Strength/Tone Deficits: grossly 4/5, fatigues very quickly RLE Sensation: History of peripheral neuropathy Left Lower Extremity Assessment LLE ROM/Strength/Tone: Deficits LLE ROM/Strength/Tone Deficits: grossly 4/5 with decreased activity tolerance, fatigues very quickly LLE Sensation: History of peripheral neuropathy Trunk Assessment Trunk Assessment: Kyphotic;Other exceptions Trunk Exceptions: forward head   Balance Static Standing Balance Static Standing - Balance Support: No upper extremity supported Static Standing - Level of Assistance: 5: Stand by assistance Static Standing - Comment/# of Minutes: attempted to perform Berg balance however pt too fatigued and sitting uncontrolled onto the bed without warning  End of Session PT - End of  Session Equipment Utilized During Treatment: Gait belt Activity Tolerance: Patient limited by fatigue Patient left: in chair;with call bell/phone within reach Nurse Communication: Mobility status  GP     [facility] HELEN 11/28/2011, 11:45 AM

## 2011-11-28 NOTE — Clinical Social Work Psychosocial (Signed)
     Clinical Social Work Department BRIEF PSYCHOSOCIAL ASSESSMENT 11/28/2011  Patient:  [name], [name]     Account Number:  [id number]     Admit date:  11/23/2011  Clinical Social Worker:  Peggyann Shoals  Date/Time:  11/28/2011 05:01 PM  Referred by:  Physician  Date Referred:  11/27/2011 Referred for  SNF Placement   Other Referral:   Interview type:  Family Other interview type:    PSYCHOSOCIAL DATA Living Status:  ALONE Admitted from facility:   Level of care:   Primary support name:  Shelbie Hutching Primary support relationship to patient:  CHILD, ADULT Degree of support available:   Supportive.    CURRENT CONCERNS Current Concerns  Post-Acute Placement   Other Concerns:    SOCIAL WORK ASSESSMENT / PLAN CSW met with pt to address consult for SNF. CSW introduced herself and explained the role of social work. CSW also explained the process of discharging to SNF.    Pt lives alone and would like to go to SNF near her family.    CSW inititated SNF search and will follow up with bed offers. CSW will continue to follow   Assessment/plan status:  Psychosocial Support/Ongoing Assessment of Needs Other assessment/ plan:   Information/referral to community resources:   SNF list    PATIENTS/FAMILYS RESPONSE TO PLAN OF CARE: Pt was alert and oriented. Pt is agreeable to SNF at discharge.

## 2011-11-29 LAB — GLUCOSE, CAPILLARY
Glucose-Capillary: 120 mg/dL — ABNORMAL HIGH (ref 70–99)
Glucose-Capillary: 121 mg/dL — ABNORMAL HIGH (ref 70–99)
Glucose-Capillary: 119 mg/dL — ABNORMAL HIGH (ref 70–99)
Glucose-Capillary: 129 mg/dL — ABNORMAL HIGH (ref 70–99)

## 2011-11-29 NOTE — Progress Notes (Signed)
Filed Vitals:   11/28/11 1818 11/28/11 2118 11/29/11 0203 11/29/11 0516  BP: 137/63 120/57 119/54 113/55  Pulse: 75 70 64 66  Temp: 99.2 F (37.3 C) 98.5 F (36.9 C) 98.2 F (36.8 C) 98.3 F (36.8 C)  TempSrc: Oral Oral Oral Oral  Resp: 18 18 17 18   Height:      Weight:      SpO2: 100% 99% 100% 100%    Patient with mild headache. Incision clean and dry. Has been up and ambulating in the halls. Awake, alert, oriented. Moving all 4 extremities well.  Plan: Continue Decadron taper. Awaiting social work placement in SNF. Will DC every other staple today, and remaining staples tomorrow.  Hewitt Shorts, MD 11/29/2011, 8:37 AM

## 2011-11-29 NOTE — Evaluation (Signed)
Occupational Therapy Evaluation Patient Details Name: [name] MRN: [id number] DOB: Apr 25, 1950 Today's Date: 11/29/2011 Time: 4098-1191 OT Time Calculation (min): 23 min  OT Assessment / Plan / Recommendation Clinical Impression  Pt admitted for frontal tumor resection.  Pt with flat affect and decreased cognition and safety awareness.  Requires min assist for OOB and some aspects of ADL and needs 24 hour supervision.  Will follow acutely.      OT Assessment  Patient needs continued OT Services    Follow Up Recommendations  Skilled nursing facility    Barriers to Discharge Decreased caregiver support    Equipment Recommendations  Defer to next venue    Recommendations for Other Services    Frequency  Min 2X/week    Precautions / Restrictions Precautions Precautions: Fall Restrictions Weight Bearing Restrictions: No   Pertinent Vitals/Pain     ADL  Eating/Feeding: Performed;Independent Where Assessed - Eating/Feeding: Edge of bed Grooming: Performed;Wash/dry hands;Supervision/safety Where Assessed - Grooming: Unsupported standing Upper Body Bathing: Simulated;Supervision/safety Where Assessed - Upper Body Bathing: Unsupported sitting Lower Body Bathing: Simulated;Minimal assistance Where Assessed - Lower Body Bathing: Unsupported sitting;Supported sit to stand Upper Body Dressing: Simulated;Set up Where Assessed - Upper Body Dressing: Unsupported sitting Lower Body Dressing: Performed;Minimal assistance Where Assessed - Lower Body Dressing: Unsupported sitting;Supported sit to stand Toilet Transfer: Performed;Minimal assistance Toilet Transfer Method: Sit to stand Toilet Transfer Equipment: Comfort height toilet Toileting - Clothing Manipulation and Hygiene: Performed;Min guard Where Assessed - Engineer, mining and Hygiene: Sit to stand from 3-in-1 or toilet Equipment Used: Gait belt Transfers/Ambulation Related to ADLs: hand held assist to ambulate  in room ADL Comments: Pt is supervised for bathing and dressing at home by aide.    OT Diagnosis: Generalized weakness;Cognitive deficits  OT Problem List: Impaired balance (sitting and/or standing);Decreased cognition;Decreased safety awareness OT Treatment Interventions: Self-care/ADL training;Cognitive remediation/compensation;Patient/family education   OT Goals Acute Rehab OT Goals OT Goal Formulation: With patient Time For Goal Achievement: 12/06/11 Potential to Achieve Goals: Good ADL Goals Pt Will Perform Grooming: with supervision;Standing at sink ADL Goal: Grooming - Progress: Goal set today Pt Will Perform Lower Body Bathing: with supervision;Sit to stand from bed ADL Goal: Lower Body Bathing - Progress: Goal set today Pt Will Perform Lower Body Dressing: with supervision;Sit to stand from bed ADL Goal: Lower Body Dressing - Progress: Goal set today Pt Will Transfer to Toilet: with supervision;Ambulation;Comfort height toilet ADL Goal: Toilet Transfer - Progress: Goal set today Pt Will Perform Toileting - Clothing Manipulation: with supervision;Standing ADL Goal: Toileting - Clothing Manipulation - Progress: Goal set today Pt Will Perform Toileting - Hygiene: with modified independence;Sit to stand from 3-in-1/toilet ADL Goal: Toileting - Hygiene - Progress: Goal set today Miscellaneous OT Goals Miscellaneous OT Goal #1: Pt will adhere to safety precautions by requesting assistance for OOB activities independently. OT Goal: Miscellaneous Goal #1 - Progress: Goal set today  Visit Information  Last OT Received On: 11/29/11 Assistance Needed: +1    Subjective Data  Subjective: "You can have some of my chips." Patient Stated Goal: Return home.   Prior Functioning  Vision/Perception  Home Living Lives With: Alone Available Help at Discharge: Neighbor;Available PRN/intermittently;Other (Comment) (pt has a daily aide) Type of Home: Apartment Home Access: Level  entry Home Layout: One level Bathroom Shower/Tub: Engineer, manufacturing systems: Standard Home Adaptive Equipment: Straight cane;Walker - rolling Prior Function Level of Independence: Needs assistance Needs Assistance: Light Housekeeping;Meal Prep Meal Prep: Minimal Light Housekeeping: Minimal Driving: No Vocation:  On disability Communication Communication: No difficulties Dominant Hand: Right      Cognition  Overall Cognitive Status: Impaired Area of Impairment: Attention;Safety/judgement;Awareness of deficits Arousal/Alertness: Awake/alert Orientation Level: Disoriented to;Time Behavior During Session: Flat affect Current Attention Level: Sustained Safety/Judgement: Decreased awareness of safety precautions;Decreased awareness of need for assistance    Extremity/Trunk Assessment Right Upper Extremity Assessment RUE ROM/Strength/Tone: WFL for tasks assessed RUE Coordination: WFL - gross/fine motor Left Upper Extremity Assessment LUE ROM/Strength/Tone: WFL for tasks assessed LUE Coordination: WFL - gross/fine motor Trunk Assessment Trunk Assessment: Kyphotic   Mobility  Shoulder Instructions  Bed Mobility Bed Mobility: Supine to Sit Supine to Sit: 5: Supervision Transfers Transfers: Sit to Stand;Stand to Sit Sit to Stand: 4: Min assist;With upper extremity assist;From bed;From toilet Stand to Sit: 4: Min guard;With upper extremity assist;To bed;To toilet       Exercise     Balance Balance Balance Assessed: Yes Static Standing Balance Static Standing - Level of Assistance: 5: Stand by assistance   End of Session OT - End of Session Activity Tolerance: Patient tolerated treatment well Patient left: in bed;with call bell/phone within reach;Other (comment) (sitter in room)  GO     Evern Bio 11/29/2011, 12:10 PM [phone]

## 2011-11-29 NOTE — Progress Notes (Signed)
Removed every other staple of pts frontal head incision as ordered for a count of 17 staples total.  Pt was premedicated with 1 Vicodin prior to removal of staples.  Pt tolerated well.  Call bell within reach.  Shacora Zynda, [place] [name], RN 5:29 PM

## 2011-11-29 NOTE — Progress Notes (Signed)
Physical Therapy Treatment Patient Details Name: [name] MRN: [id number] DOB: [birth year] Today's Date: 11/29/2011 Time: 3086-5784 PT Time Calculation (min): 10 min  PT Assessment / Plan / Recommendation Comments on Treatment Session  Pt fatiques quickly and unable to perform high level standing balance secondary to fatique.  Very flat affect through out treatment.  Pt does request to ambulate with nursing.    Follow Up Recommendations  Skilled nursing facility    Barriers to Discharge        Equipment Recommendations  Defer to next venue    Recommendations for Other Services OT consult  Frequency Min 4X/week   Plan Discharge plan remains appropriate;Frequency remains appropriate    Precautions / Restrictions Precautions Precautions: Fall Restrictions Weight Bearing Restrictions: No   Pertinent Vitals/Pain No c/o pain.    Mobility  Bed Mobility Bed Mobility: Supine to Sit;Sit to Supine Supine to Sit: 5: Supervision Sit to Supine: 5: Supervision Details for Bed Mobility Assistance: very slow  but not physical assist needed Transfers Transfers: Sit to Stand;Stand to Sit Sit to Stand: 5: Supervision;With upper extremity assist;From bed Stand to Sit: 5: Supervision;With upper extremity assist;To bed Ambulation/Gait Ambulation/Gait Assistance: 4: Min guard Ambulation Distance (Feet): 160 Feet Assistive device: None Ambulation/Gait Assistance Details: Min guard assist for safety.  Very slow gait speed.  Able in increase speed slightly with cues but reverts back to previous speed.  One LOB with amb but recovered without assist. Gait Pattern: Decreased stride length;Step-through pattern;Narrow base of support Gait velocity: very slow Stairs: No Wheelchair Mobility Wheelchair Mobility: No         PT Goals Acute Rehab PT Goals PT Goal: Sit to Stand - Progress: Progressing toward goal PT Goal: Stand to Sit - Progress: Progressing toward goal PT Transfer Goal: Bed to  Chair/Chair to Bed - Progress: Progressing toward goal PT Goal: Ambulate - Progress: Progressing toward goal  Visit Information  Last PT Received On: 11/29/11 Assistance Needed: +1    Subjective Data  Subjective: "I just got back from a walk.  Can I rest a little first?"   Cognition  Overall Cognitive Status: Impaired Area of Impairment: Attention;Safety/judgement;Awareness of deficits Arousal/Alertness: Awake/alert Orientation Level: Appears intact for tasks assessed Behavior During Session: Flat affect Current Attention Level: Sustained Following Commands: Follows one step commands consistently;Follows multi-step commands with increased time Safety/Judgement: Decreased awareness of safety precautions;Decreased awareness of need for assistance Cognition - Other Comments: slow reaction time    Balance  Balance Balance Assessed: Yes Static Sitting Balance Static Sitting - Balance Support: No upper extremity supported;Feet supported Static Sitting - Level of Assistance: 7: Independent Static Sitting - Comment/# of Minutes: prefers sitting EOB with sitter present Dynamic Sitting Balance Dynamic Sitting - Balance Support: No upper extremity supported;Feet supported Dynamic Sitting - Level of Assistance: 7: Independent Reach (Patient is able to reach ___ inches to right, left, forward, back): 12 Dynamic Sitting - Balance Activities: Lateral lean/weight shifting;Forward lean/weight shifting;Reaching for objects Static Standing Balance Static Standing - Level of Assistance: 5: Stand by assistance  End of Session PT - End of Session Equipment Utilized During Treatment: Gait belt Activity Tolerance: Patient limited by fatigue Patient left: in bed;with call bell/phone within reach;Other (comment) (sitter present) Nurse Communication: Mobility status    [name] 11/29/2011, 1:12 PM  [name], PTA Acute Rehab [phone] (office)

## 2011-11-30 LAB — GLUCOSE, CAPILLARY
Glucose-Capillary: 127 mg/dL — ABNORMAL HIGH (ref 70–99)
Glucose-Capillary: 142 mg/dL — ABNORMAL HIGH (ref 70–99)
Glucose-Capillary: 147 mg/dL — ABNORMAL HIGH (ref 70–99)
Glucose-Capillary: 119 mg/dL — ABNORMAL HIGH (ref 70–99)
Glucose-Capillary: 119 mg/dL — ABNORMAL HIGH (ref 70–99)

## 2011-11-30 MED ORDER — ACETAMINOPHEN 325 MG PO TABS
325.0000 mg | ORAL_TABLET | ORAL | Status: DC | PRN
  Administered 2011-11-30 – 2011-12-01 (×4): 650 mg via ORAL
  Filled 2011-11-30 (×4): qty 2

## 2011-11-30 MED ORDER — DEXAMETHASONE 0.75 MG PO TABS
0.7500 mg | ORAL_TABLET | Freq: Every day | ORAL | Status: DC
  Administered 2011-12-01: 0.75 mg via ORAL
  Filled 2011-11-30: qty 1

## 2011-11-30 NOTE — Progress Notes (Signed)
PT Cancellation Note  Treatment cancelled today due to pt in 9/10 head pain, RN informed via call bell.  Pt also reporting she feels like her blood sugar is low RN to check.  PT to check back later today or tomorrow as time allows.Lurena Joiner B. Fatuma Dowers, PT, DPT [phone]   11/30/2011, 3:02 PM

## 2011-11-30 NOTE — Progress Notes (Signed)
Filed Vitals:   11/29/11 1810 11/29/11 2117 11/30/11 0140 11/30/11 0528  BP: 98/48 122/53 111/54 107/55  Pulse: 72 65 63 57  Temp: 98.8 F (37.1 C) 98.3 F (36.8 C) 97.8 F (36.6 C) 98.5 F (36.9 C)  TempSrc: Oral Oral Oral Oral  Resp: 18 18 20 20   Height:      Weight:      SpO2: 100% 97% 99% 100%    Patient up and about. Comfortable. Half the staples removed yesterday, remaining half to be removed today. Awaiting placement in an SNF.  Plan: Will continue Decadron taper. Encouraged to ambulate.  Hewitt Shorts, MD 11/30/2011, 9:48 AM

## 2011-11-30 NOTE — Clinical Social Work Note (Signed)
Clinical Social Work  CSW contacted each facility in [place] to confirm if pt's PCP rounded at SNFs. Per each SNF, Dr. Lodema Hong does not round at SNF. At the SNFs in [place], Dr. Lodema Hong used to cover SNF, however no longer rounds.   CSW met with pt to provide bed offers. Pt shared that she would like to go to [facility], however there is not an availability at this time. Per facility, admissions coordinator will contact this CSW is a bed becomes available. CSW explained that pt will need to choose a facility as she will be ready for discharge tomorrow.   CSW will continue to follow to facilitate discharge to SNF.   Dede Query, MSW, Theresia Majors [phone]

## 2011-11-30 NOTE — Progress Notes (Signed)
Physical Therapy Treatment Patient Details Name: [name] MRN: [id number] DOB: 09-Jan-1951 Today's Date: 11/30/2011 Time: 4098-1191 PT Time Calculation (min): 18 min  PT Assessment / Plan / Recommendation Comments on Treatment Session  Pt extremely reluctant to walk today.  PT checked on her three times before she reluctantly agreed to walk.  "If Doctor [name] wants me to walk he should walk me himself!"      Follow Up Recommendations  Skilled nursing facility    Barriers to Discharge        Equipment Recommendations  None recommended by PT    Recommendations for Other Services    Frequency Min 4X/week   Plan Discharge plan remains appropriate;Frequency remains appropriate    Precautions / Restrictions Precautions Precautions: Fall   Pertinent Vitals/Pain 6/10 head pain, premedicated, repositioned    Mobility  Transfers Sit to Stand: 5: Supervision;Without upper extremity assist;From bed;From elevated surface Stand to Sit: 5: Supervision;With upper extremity assist;To elevated surface;To bed Details for Transfer Assistance: supervision for safety Ambulation/Gait Ambulation/Gait Assistance: 4: Min assist Ambulation Distance (Feet): 150 Feet Assistive device: 1 person hand held assist Ambulation/Gait Assistance Details: min assist to help steady pt for balance.  Gait Pattern: Decreased stride length;Step-through pattern;Narrow base of support Gait velocity: less than 1.8 ft/sec which puts her at risk for recurrent falls    Exercises  pt refused due to dinner was there    PT Goals Acute Rehab PT Goals PT Goal: Sit to Stand - Progress: Progressing toward goal PT Goal: Stand to Sit - Progress: Progressing toward goal PT Goal: Ambulate - Progress: Progressing toward goal  Visit Information  Last PT Received On: 11/30/11 Assistance Needed: +1 Reason Eval/Treat Not Completed: Patient refused    Subjective Data  Subjective: Pt refused three times before reluctantly  agreeing to walk with PT.     Cognition  Overall Cognitive Status: Impaired Area of Impairment: Attention;Safety/judgement;Awareness of deficits Arousal/Alertness: Awake/alert Orientation Level: Appears intact for tasks assessed Behavior During Session: Flat affect Current Attention Level: Sustained Following Commands: Follows one step commands consistently;Follows multi-step commands with increased time Safety/Judgement: Decreased awareness of safety precautions;Decreased awareness of need for assistance Cognition - Other Comments: slow reaction time       End of Session PT - End of Session Activity Tolerance: Patient limited by fatigue;Patient limited by pain Patient left: in bed;with call bell/phone within reach     [place] B. Adir Schicker, PT, DPT [phone]   11/30/2011, 5:01 PM

## 2011-11-30 NOTE — Progress Notes (Signed)
Nutrition Follow-up  Intervention:   Continue current interventions  Assessment:   Pt admitted for subfrontal meningioma. She is POD # 7 s/p craniotomy with tumor excision. Pt is on a decadron taper, awaiting SNF placement.   Diet Order:  CHO Modified Medium Meal completion >75% Ensure Complete po BID.   Meds: Scheduled Meds:   . atorvastatin  20 mg Oral q1800  . cloNIDine  0.3 mg Oral TID  . cycloSPORINE  1 drop Both Eyes BID  . dexamethasone  0.75 mg Oral Daily  . feeding supplement  237 mL Oral BID BM  . hydrochlorothiazide  12.5 mg Oral Daily  . lamoTRIgine  100 mg Oral Daily  . levothyroxine  50 mcg Oral BH-q7a  . linagliptin  5 mg Oral Daily  . LORazepam  0.5 mg Oral TID  . metFORMIN  1,000 mg Oral BID WC  . metoprolol  50 mg Oral BID  . mirtazapine  30 mg Oral QHS  . montelukast  10 mg Oral Daily  . pantoprazole  40 mg Oral Q1200  . pregabalin  75 mg Oral BID  . sertraline  150 mg Oral QHS  . thiothixene  2 mg Oral QHS  . traZODone  50 mg Oral QHS  . DISCONTD: dexamethasone  0.75 mg Oral Q12H   Continuous Infusions:  PRN Meds:.acetaminophen, bisacodyl, hydrOXYzine, hydrOXYzine, magnesium hydroxide, ondansetron, DISCONTD: HYDROcodone-acetaminophen  Labs:  CMP     Component Value Date/Time   NA 142 11/24/2011 0500   K 4.0 11/24/2011 0500   CL 105 11/24/2011 0500   CO2 25 11/24/2011 0500   GLUCOSE 236* 11/24/2011 0500   BUN 11 11/24/2011 0500   CREATININE 0.60 11/24/2011 0500   CREATININE 0.75 10/31/2011 1440   CALCIUM 8.6 11/24/2011 0500   PROT 6.4 10/31/2011 1440   ALBUMIN 3.7 10/31/2011 1440   AST 14 10/31/2011 1440   ALT 25 10/31/2011 1440   ALKPHOS 110 10/31/2011 1440   BILITOT 0.2* 10/31/2011 1440   GFRNONAA >90 11/24/2011 0500   GFRAA >90 11/24/2011 0500   CBG (last 3)   Basename 11/30/11 0650 11/29/11 2120 11/29/11 1642  GLUCAP 127* 129* 119*    Intake/Output Summary (Last 24 hours) at 11/30/11 1102 Last data filed at 11/29/11 1700  Gross per 24 hour    Intake    240 ml  Output      0 ml  Net    240 ml   Last BM: 11/29/11  Weight Status:  164 lbs on admission 163 lbs 9/15  Re-estimated needs:    Kcal: 1500-1700   Protein: 75-85 grams   Fluid: >1.5 L/day  Nutrition Dx:  Inadequate oral intake; resolved.  Goal: Pt to meet >/= 90% of their estimated nutrition needs; met  Monitor:  PO intake, weight   Kendell Bane RD, LDN, CNSC [phone] Pager [phone] After Hours Pager

## 2011-12-01 LAB — GLUCOSE, CAPILLARY
Glucose-Capillary: 128 mg/dL — ABNORMAL HIGH (ref 70–99)
Glucose-Capillary: 210 mg/dL — ABNORMAL HIGH (ref 70–99)

## 2011-12-01 MED ORDER — ACETAMINOPHEN 325 MG PO TABS: 325.0000 mg | ORAL_TABLET | ORAL | Status: DC | PRN

## 2011-12-01 NOTE — Progress Notes (Signed)
Physical Therapy Treatment Patient Details Name: [name] MRN: [id number] DOB: August 27, 1950 Today's Date: 12/01/2011 Time: 7846-9629 PT Time Calculation (min): 28 min  PT Assessment / Plan / Recommendation Comments on Treatment Session  Pt cooperative; given choices of activities to work on today and pt unable to choose. Overall pt is improving. Continues with flat affect and pain (9/10 headache) limiting participation, however better today.    Follow Up Recommendations  Skilled nursing facility    Barriers to Discharge        Equipment Recommendations  None recommended by PT    Recommendations for Other Services    Frequency Min 3X/week   Plan Discharge plan remains appropriate;Frequency needs to be updated    Precautions / Restrictions Precautions Precautions: Fall   Pertinent Vitals/Pain 9/10 headache; RN notified pt requesting pain medicine and in to give to pt at end of session    Mobility  Bed Mobility Bed Mobility: Right Sidelying to Sit;Sitting - Scoot to Edge of Bed Right Sidelying to Sit: 5: Supervision;HOB elevated (HOB 25) Sitting - Scoot to Edge of Bed: 5: Supervision Details for Bed Mobility Assistance: supervision for safety due to decr cognition Transfers Transfers: Sit to Stand;Stand to Sit Sit to Stand: 5: Supervision;Without upper extremity assist;From bed;From chair/3-in-1;From toilet Stand to Sit: 5: Supervision;Without upper extremity assist;To chair/3-in-1;To toilet Details for Transfer Assistance: supervision for safety due to slightly unsteady/sways Ambulation/Gait Ambulation/Gait Assistance: 4: Min guard Ambulation Distance (Feet): 150 Feet Assistive device: None Ambulation/Gait Assistance Details: minguard at pt's waist with no staggering or loss of balance requiring assist; able to maintain straight path even with head turns Gait Pattern: Step-through pattern;Decreased stride length Gait velocity: less than 1.8 ft/sec which puts her at risk  for recurrent falls; pt unable to incr or decr velocity signifcantly    Exercises General Exercises - Lower Extremity Ankle Circles/Pumps: AROM;Both;20 reps;Seated Hip Flexion/Marching: AAROM;Both;10 reps;Standing (light bil UE support; trying to maximize stepping height) Toe Raises: AAROM;Both;10 reps;Standing (light bil UE support; to challenge balance) Other Exercises Other Exercises: sit to stand x 3 from chair without UE assist   PT Diagnosis:    PT Problem List:   PT Treatment Interventions:     PT Goals Acute Rehab PT Goals Pt will go Sit to Stand: with modified independence PT Goal: Sit to Stand - Progress: Progressing toward goal Pt will go Stand to Sit: with modified independence PT Goal: Stand to Sit - Progress: Progressing toward goal Pt will Transfer Bed to Chair/Chair to Bed: with modified independence PT Transfer Goal: Bed to Chair/Chair to Bed - Progress: Progressing toward goal Pt will Ambulate: >150 feet;with modified independence PT Goal: Ambulate - Progress: Progressing toward goal Pt will Perform Home Exercise Program: Independently PT Goal: Perform Home Exercise Program - Progress: Progressing toward goal  Visit Information  Last PT Received On: 12/01/11 Assistance Needed: +1    Subjective Data  Subjective: Reports she feels weak all over Patient Stated Goal: Wants to return to living alone in her apartment   Cognition  Overall Cognitive Status: Impaired Area of Impairment: Memory Arousal/Alertness: Awake/alert Behavior During Session: Flat affect Current Attention Level: Selective Memory Deficits: difficulty remembering how she functioned PTA ("I don't know" to almost all questions related to how she functioned) Following Commands: Follows one step commands consistently;Follows multi-step commands with increased time Awareness of Deficits: improved--able to state she is weak and knows she needs help    Balance  Balance Balance Assessed: Yes Static  Standing Balance Static  Standing - Balance Support: No upper extremity supported Static Standing - Level of Assistance: 5: Stand by assistance Static Standing - Comment/# of Minutes: + sway with feet at shoulder width Tandem Stance - Right Leg: 15  (Incr sway and stepped) Tandem Stance - Left Leg: 12  (incr sway and step) Rhomberg - Eyes Opened: 25  (incr sway, no stepping) Rhomberg - Eyes Closed: 15  (same amount of sway as with eyes open)  End of Session PT - End of Session Equipment Utilized During Treatment: Gait belt Activity Tolerance: Patient limited by pain Patient left: in chair;with call bell/phone within reach;with nursing in room Nurse Communication: Mobility status   GP     Esthela Brandner 12/01/2011, 10:51 AM Pager [phone]

## 2011-12-01 NOTE — Clinical Social Work Note (Signed)
Clinical Social Work  CSW met with pt to address discharge plan. Pt shared that she is agreeable to [facility], as first choice does not have availability. Pt's niece and POA is aware and agreeable to discharge plan. CSW left a message with [facility]'s admissions coordinator requesting a return phone.   CSW will continue to follow to facilitate discharge [facility].   Dede Query, MSW, [facility] [phone]

## 2011-12-01 NOTE — Progress Notes (Signed)
Filed Vitals:   11/30/11 1841 11/30/11 2122 12/01/11 0150 12/01/11 0535  BP: 138/65 126/55 135/65 142/65  Pulse: 62 78 79 72  Temp: 98.1 F (36.7 C) 98.1 F (36.7 C) 97.3 F (36.3 C) 98 F (36.7 C)  TempSrc: Oral Oral Oral Oral  Resp: 18 20 20 20   Height:      Weight:      SpO2: 100% 100% 100% 100%    Patient comfortable, mild headache, been treated with Tylenol. Wound clean and dry, healing well, all staples out. Continuing Decadron taper, to receive 0.75 mg dose this morning and again tomorrow morning, and then discontinue. Continue to ambulate in the halls.  Plan: Encouraged to ambulate. Awaiting transfer to SNF.  Hewitt Shorts, MD 12/01/2011, 8:21 AM

## 2011-12-01 NOTE — Discharge Summary (Addendum)
Physician Discharge Summary  Patient ID: [name] MRN: [id number] DOB/AGE: 05-05-1950 61 y.o.  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 Postoperatively she was managed initially in the intensive care unit. She did well. She was supported with Decadron, and it was tapered gradually through her hospitalization. She was initially kept on all of her diabetic medications, however she did have a mild hypoglycemic episode and therefore we discontinued her glipizide, and her blood sugars have been under good control since. Patient was seen in consultation by physical therapy occupational therapy. After consultation with the patient and her family it was felt best that she transition back to her apartment through rehabilitation at a skilled nursing facility, and request was made to case management for referral. Patient has a bed offer from a skilled nursing facility, and is being transferred today to such.  Currently all of her preadmission diabetic medications are listed, however the staff at the skilled nursing facility in conjunction with their physician we'll need to determine whether or not to continue her glipizide, by monitoring her blood sugars.  All of her preadmission psychiatric medications have been continued.  Her Decadron was tapered through the hospitalization, and does not need to be continued following discharge. She will need to return to my office for followup with me in about one month. Her wound is healed nicely, and her staples had been removed.  She will need to return to her primary physician Dr. Syliva Overman for followup within 2-4 weeks of discharge from the hospital.  Discharge Exam: Blood pressure 96/54, pulse 78, temperature 98.7 F (37.1 C), temperature source Oral, resp. rate 20, height 5\' 2"  (1.575 m), weight 74.1 kg (163 lb 5.8 oz), SpO2 100.00%.  Disposition: Skilled nursing facility  Discharge Orders    Future Appointments: Provider: Department: Dept Phone: Center:   02/14/2012 9:00 AM Kerri Perches, MD [facility] [phone] [facility]       Medication List     As of 12/01/2011  2:39 PM    STOP taking these medications         dexamethasone 1.5 MG tablet   Commonly known as: DECADRON      TAKE these medications         acetaminophen 325 MG tablet   Commonly known as: TYLENOL   Take 1-2 tablets (325-650 mg total) by mouth every 4 (four) hours as needed for pain.      clindamycin 300 MG capsule   Commonly known as: CLEOCIN   Take 300 mg by mouth 3 (three) times daily. Started on 11/16/11 and finishes on 11/26/11      cloNIDine 0.3 MG tablet   Commonly known as: CATAPRES   Take 0.3 mg by mouth 3 (three) times daily.      clopidogrel 75 MG tablet   Commonly known as: PLAVIX   Take 75 mg by mouth daily.      cyclobenzaprine 10 MG tablet   Commonly known as: FLEXERIL   Take 10 mg by mouth daily as needed. spasm      cycloSPORINE 0.05 % ophthalmic emulsion   Commonly known as: RESTASIS   1 drop 2 (two) times daily.      diphenoxylate-atropine 2.5-0.025  MG per tablet   Commonly known as: LOMOTIL   Take 1 tablet by mouth 4 (four) times daily as needed. For loose stools      ergocalciferol 50000 UNITS capsule   Commonly known as: VITAMIN D2   Take 1 capsule (50,000 Units total) by mouth once a week. Patient takes on Sundays      fluticasone 50 MCG/ACT nasal spray   Commonly known as: FLONASE   Place 1 spray into the nose daily. Allergies      glipiZIDE 10 MG tablet   Commonly known as: GLUCOTROL   Take 1 tablet (10 mg total) by mouth daily  before breakfast.      hydrochlorothiazide 12.5 MG capsule   Commonly known as: MICROZIDE   Take 12.5 mg by mouth daily.      lamoTRIgine 100 MG tablet   Commonly known as: LAMICTAL   Take 1 tablet (100 mg total) by mouth every morning.      LEVOXYL 50 MCG tablet   Generic drug: levothyroxine   Take 50 mcg by mouth every morning.      LORazepam 1 MG tablet   Commonly known as: ATIVAN   Take 0.5 tablets (0.5 mg total) by mouth 3 (three) times daily.      meclizine 12.5 MG tablet   Commonly known as: ANTIVERT   Take 12.5 mg by mouth 3 (three) times daily as needed. dizziness      metFORMIN 1000 MG tablet   Commonly known as: GLUCOPHAGE   Take 1 tablet (1,000 mg total) by mouth 2 (two) times daily with a meal.      metoprolol 50 MG tablet   Commonly known as: LOPRESSOR   Take 50 mg by mouth 2 (two) times daily.      mirtazapine 30 MG tablet   Commonly known as: REMERON   Take 30 mg by mouth at bedtime.      morphine 15 MG tablet   Commonly known as: MSIR   Take 15 mg by mouth 2 (two) times daily as needed. For pain      nystatin 100000 UNIT/GM Powd   APPLY TO AFFECTED AREAS TWICE DAILY.      ONGLYZA 5 MG Tabs tablet   Generic drug: saxagliptin HCl   Take 5 mg by mouth daily.      pregabalin 75 MG capsule   Commonly known as: LYRICA   Take 1 capsule (75 mg total) by mouth 2 (two) times daily.      promethazine 12.5 MG tablet   Commonly known as: PHENERGAN   Take 12.5 mg by mouth every 6 (six) hours as needed. For nausea      RABEprazole 20 MG tablet   Commonly known as: ACIPHEX   Take 1 tablet (20 mg total) by mouth daily.      rosuvastatin 10 MG tablet   Commonly known as: CRESTOR   Take 10 mg by mouth daily.      sertraline 100 MG tablet   Commonly known as: ZOLOFT   Take 150 mg by mouth at bedtime.      SINGULAIR 10 MG tablet   Generic drug: montelukast   TAKE ONE TABLET DAILY.      thiothixene 2 MG capsule   Commonly known as: NAVANE   Take 2 mg  by mouth at bedtime.      tiZANidine 4 MG tablet   Commonly known as: ZANAFLEX   Take 4 mg by mouth 3 (three) times daily  as needed. For spasms      traZODone 50 MG tablet   Commonly known as: DESYREL   Take 50 mg by mouth at bedtime.      VOLTAREN 1 % Gel   Generic drug: diclofenac sodium   APPLY TWICE DAILY TO AFFECTED AREA(S) AS NEEDED FOR PAIN.      ZOFRAN 4 MG tablet   Generic drug: ondansetron   TAKE 1 TABLET BY MOUTH EVERY 8 HOURS AS NEEDED FOR NAUSEA.         Signed: Hewitt Shorts, MD 12/01/2011, 2:39 PM

## 2011-12-01 NOTE — Clinical Social Work Note (Signed)
Clinical Social Work  Pt is ready for discharge to [facility]. Facility has received discharge summary and is ready to admit pt. Pt and family are agreeable to discharge plan. [facility] will provide transportation to facility. CSW is signing off as no further needs identified.   Dede Query, MSW, [facility] [phone]

## 2011-12-09 IMAGING — CT CT HEAD W/O CM
1 series · 16 of 30 positions shown, 20 images · non-contrast
Comparison: 03/30/2010

CLINICAL DATA: Fall, dizziness, occipital pain, hypertension,
diabetes, stroke

CT HEAD WITHOUT CONTRAST
TECHNIQUE: Contiguous axial images were obtained from the base of
the skull through the vertex without contrast.

[Series 2: headtrauma 4.8 h37s · axial · 0.47mm/px · z∈[+123,+264]mm · 16 of 30 slices shown, 20 images]
[im 2/30  brain]
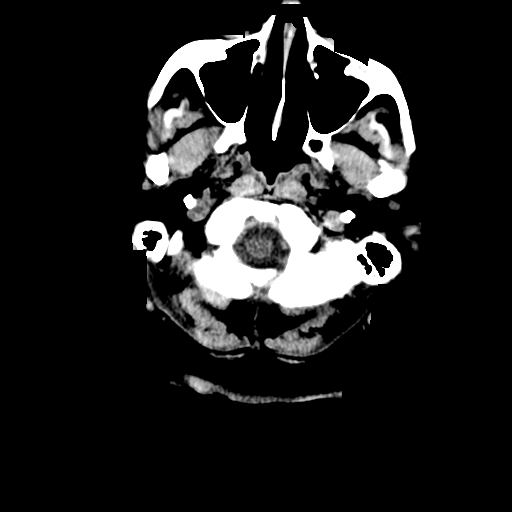
[im 2/30  bone]
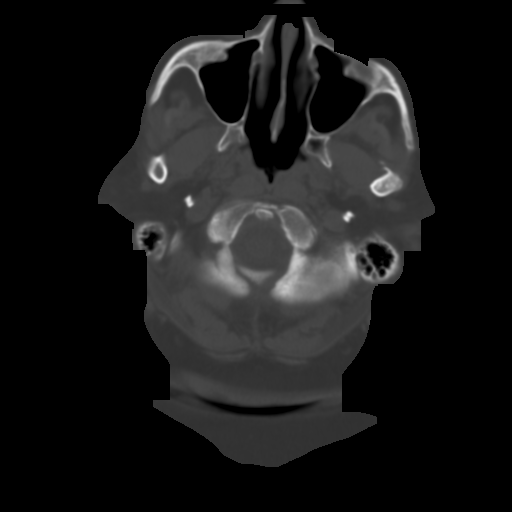
[im 4/30  brain]
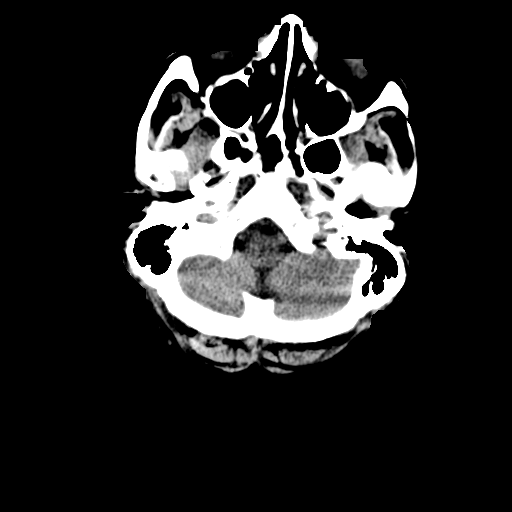
[im 6/30  brain]
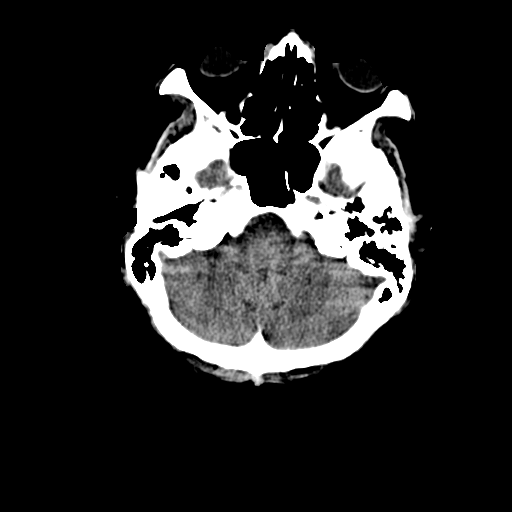
[im 8/30  brain]
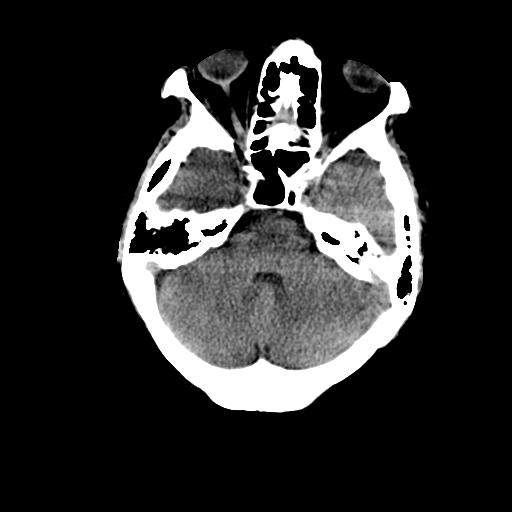
[im 9/30  brain]
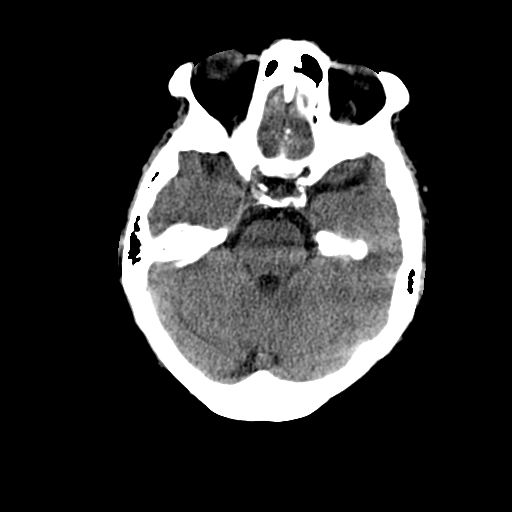
[im 9/30  bone]
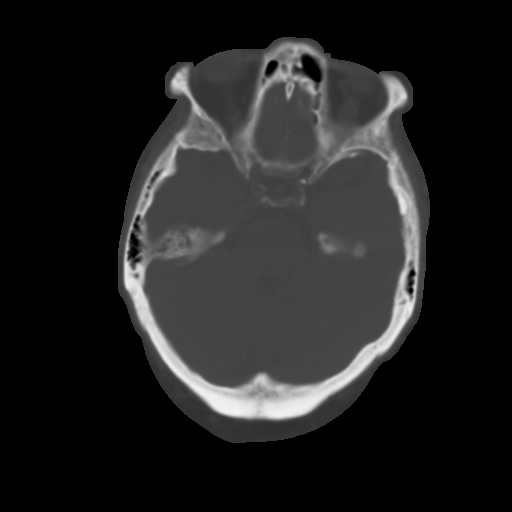
[im 11/30  brain]
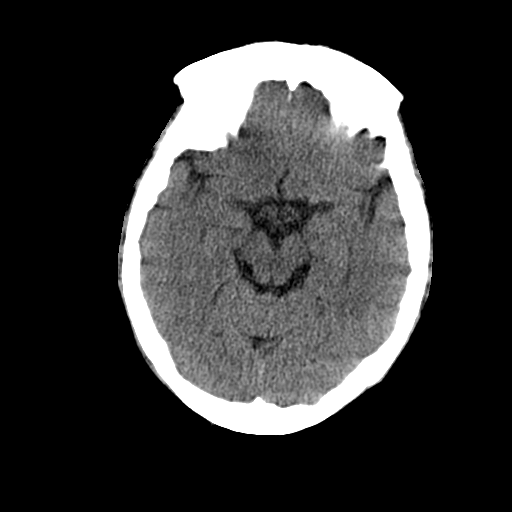
[im 13/30  brain]
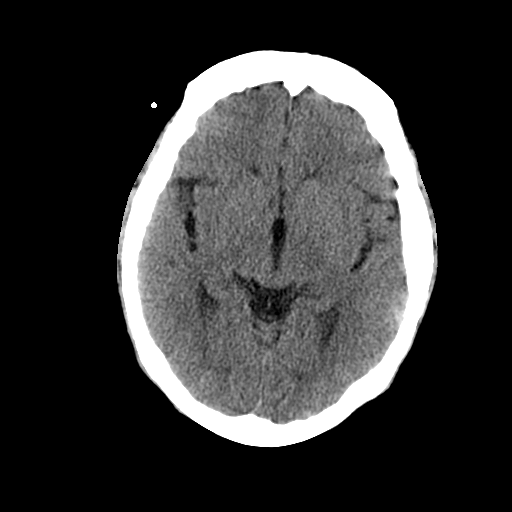
[im 15/30  brain]
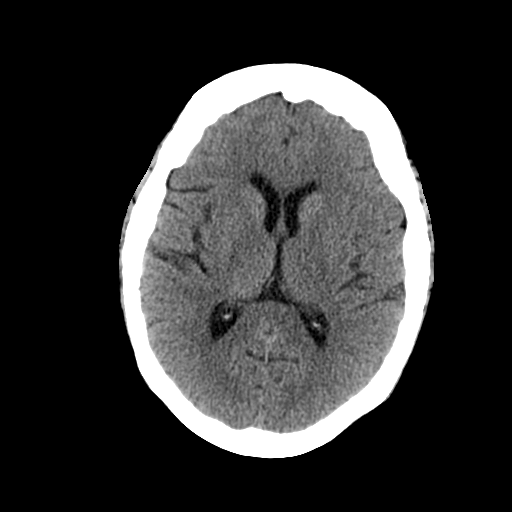
[im 16/30  brain]
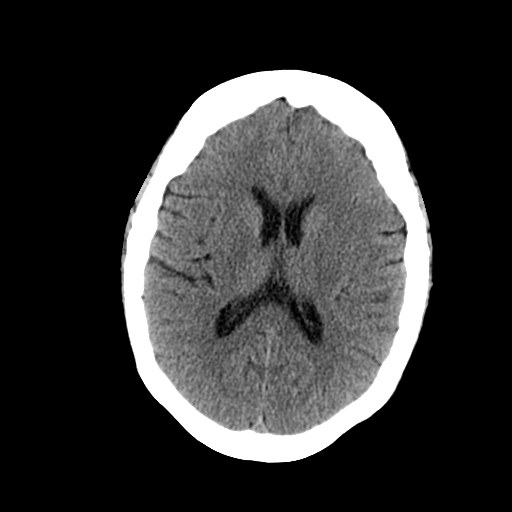
[im 16/30  bone]
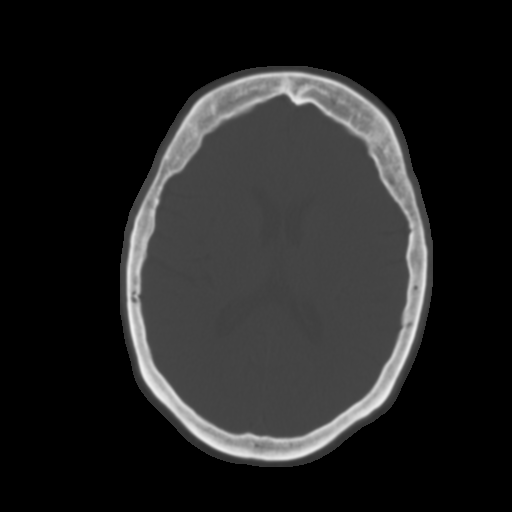
[im 18/30  brain]
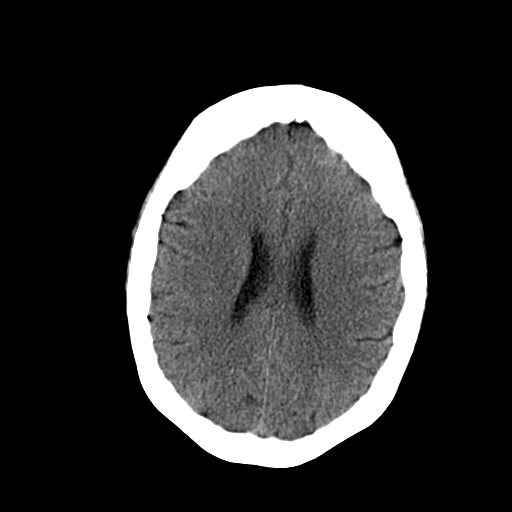
[im 20/30  brain]
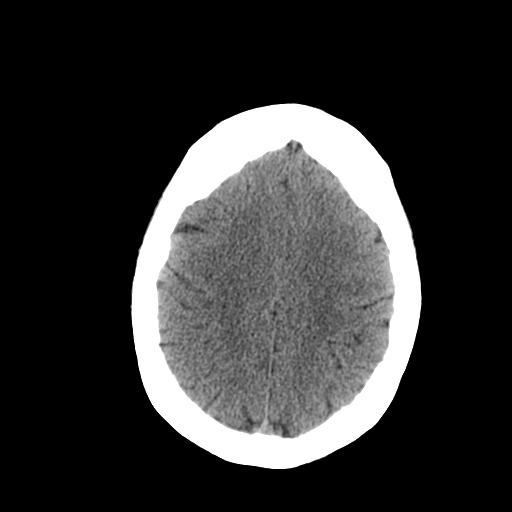
[im 22/30  brain]
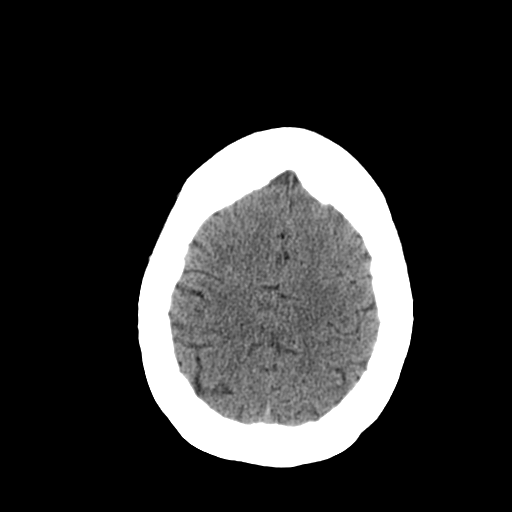
[im 23/30  brain]
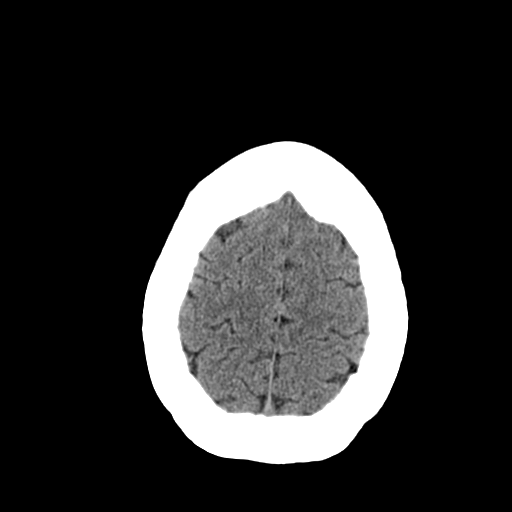
[im 23/30  bone]
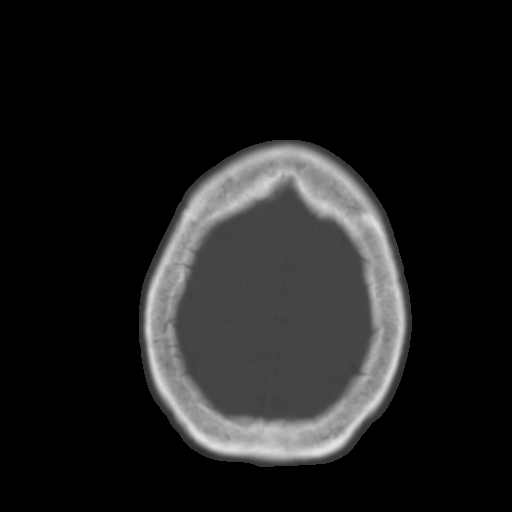
[im 25/30  brain]
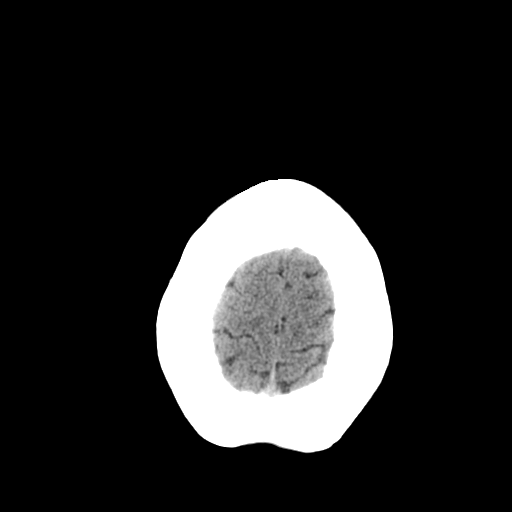
[im 27/30  brain]
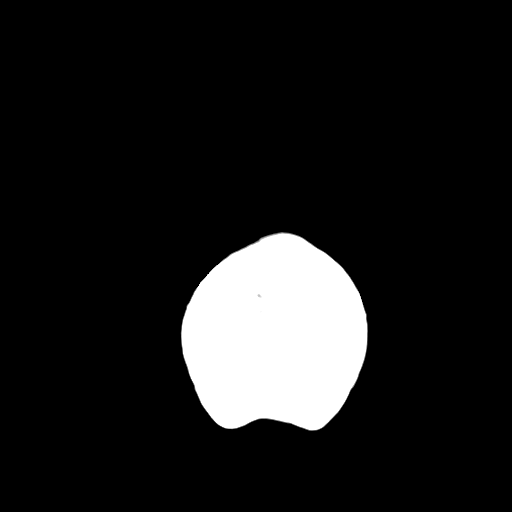
[im 29/30  brain]
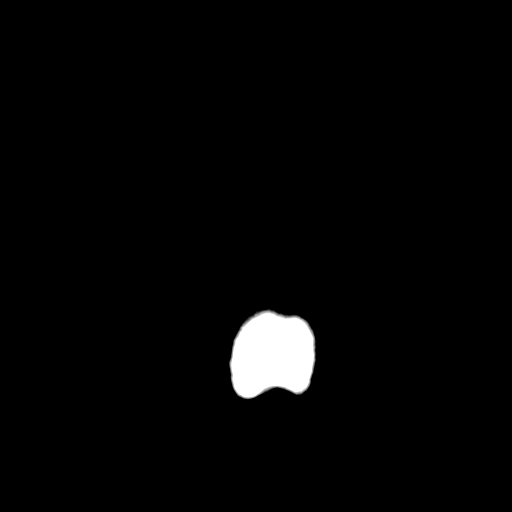

[16 of 30 positions shown; findings below may reference images not displayed]

FINDINGS: Preliminary report issued by by Dr. Issadeen 05/16/2010.
Normal ventricular morphology.
No midline shift or mass effect.
Normal appearance of brain parenchyma.
No intracranial hemorrhage, mass lesion, or acute infarction.
Visualized paranasal sinuses and mastoid air cells clear.
Bones unremarkable.
IMPRESSION: No acute intracranial abnormalities.
The final report concurs with the preliminary report issued by Dr.
Issadeen on 05/16/2010.

## 2011-12-23 ENCOUNTER — Telehealth: Payer: Self-pay | Admitting: Family Medicine

## 2011-12-23 NOTE — Telephone Encounter (Signed)
[name], pt nurse called, she was discharged from SNF s/p brain surgery . Her blood sugar was 300 today, she is om Metformin, glipizide and onglyza, SNF discharge papers show humalog sliding scale, though pt does not have this. She has levemir and took 20 units, states she was given instruction to do so.  She was given high dose steroids in hospital and was tapered off.  I do not see levemir on her medication list. However with elevated sugars, will have her give 20 units over the weekend, and will allow PCP to decide on continuing long acting or reinstating slide scale.

## 2011-12-25 ENCOUNTER — Other Ambulatory Visit: Payer: Self-pay | Admitting: Family Medicine

## 2011-12-25 NOTE — Telephone Encounter (Signed)
Advised pt to discontinue levemir

## 2011-12-28 ENCOUNTER — Ambulatory Visit (INDEPENDENT_AMBULATORY_CARE_PROVIDER_SITE_OTHER): Payer: Medicare Other | Admitting: Family Medicine

## 2011-12-28 ENCOUNTER — Encounter: Payer: Self-pay | Admitting: Family Medicine

## 2011-12-28 VITALS — BP 100/60 | HR 80 | Resp 15 | Ht 62.0 in | Wt 151.0 lb

## 2011-12-28 DIAGNOSIS — R296 Repeated falls: Secondary | ICD-10-CM

## 2011-12-28 DIAGNOSIS — M79605 Pain in left leg: Secondary | ICD-10-CM | POA: Insufficient documentation

## 2011-12-28 DIAGNOSIS — E119 Type 2 diabetes mellitus without complications: Secondary | ICD-10-CM

## 2011-12-28 DIAGNOSIS — Z23 Encounter for immunization: Secondary | ICD-10-CM

## 2011-12-28 DIAGNOSIS — Z9181 History of falling: Secondary | ICD-10-CM

## 2011-12-28 DIAGNOSIS — M79609 Pain in unspecified limb: Secondary | ICD-10-CM

## 2011-12-28 DIAGNOSIS — I10 Essential (primary) hypertension: Secondary | ICD-10-CM

## 2011-12-28 DIAGNOSIS — M549 Dorsalgia, unspecified: Secondary | ICD-10-CM

## 2011-12-28 DIAGNOSIS — S0180XA Unspecified open wound of other part of head, initial encounter: Secondary | ICD-10-CM

## 2011-12-28 DIAGNOSIS — S01112A Laceration without foreign body of left eyelid and periocular area, initial encounter: Secondary | ICD-10-CM

## 2011-12-28 DIAGNOSIS — F319 Bipolar disorder, unspecified: Secondary | ICD-10-CM

## 2011-12-28 DIAGNOSIS — K219 Gastro-esophageal reflux disease without esophagitis: Secondary | ICD-10-CM

## 2011-12-28 MED ORDER — DICLOFENAC SODIUM 1 % TD GEL: TRANSDERMAL | Status: DC

## 2011-12-28 NOTE — Progress Notes (Signed)
  Subjective:    Patient ID: [name], female    DOB: 1951/01/01, 61 y.o.   MRN: [id number]  HPI Pt in today for f/u of recent discharge from a skilled nursing facility where she remained for 3 weeks following resection of a meningioma. She has been home for 4 days, and states the first day, while trying to "fix something " on her vacuum cleaner, a part popped off, hit her over the left eyebrow, which was cut and is now healing. Requests home OT/PT  due to difficulty with memory speech and high fall risk, will pursue  Denies polyuria or polydipsia. C/o left leg pain, no trauma, requests topical gel   Review of Systems See HPI Denies recent fever or chills. Denies sinus pressure, nasal congestion, ear pain or sore throat. Denies chest congestion, productive cough or wheezing. Denies chest pains, palpitations and leg swelling Denies abdominal pain, nausea, vomiting,diarrhea or constipation.   Denies dysuria, frequency, hesitancy or incontinence. Denies joint pain, swelling and limitation in mobility. Denies headaches, seizures, numbness, or tingling. Denies uncontrolled  depression, anxiety or insomnia.        Objective:   Physical Exam Patient alert however slightly drowsy, and in no cardiopulmonary distress.  HEENT: No facial asymmetry, EOMI, no sinus tenderness,  oropharynx pink and moist.  Neck supple no adenopathy.  Chest: Clear to auscultation bilaterally.  CVS: S1, S2 no murmurs, no S3.  ABD: Soft non tender. Bowel sounds normal.  Ext: No edema  MS: Decreased ROM spine, shoulders, hips and knees.  Skin:laceration over medial aspect left eyebrow, 2cm long, no purulent drainage, dried blood over area  Psych: Good eye contact, normal affect. Memory mildly impaired anxious or depressed appearing.  CNS: CN 2-12 intact, power, tone and sensation normal throughout.        Assessment & Plan:

## 2011-12-28 NOTE — Patient Instructions (Addendum)
F/u in December.  New medication voltaren gel for use on left leg twice daily, as needed, for pain   TdAP today due to laceration over the left eyebrow.  DO NOT exert yourself at home, give yourself a chance to recover from recent brain surgery  [facility] /oT home eval since you report difficulty with speech and memory, and also you have a gait problem  With left sided weakness  HBA1c, and chem 7 and EGFR in December, non fasting  You need the shingles vaccine, please check at your pharmacy

## 2011-12-28 NOTE — Assessment & Plan Note (Signed)
Controlled, no change in medication  

## 2011-12-28 NOTE — Assessment & Plan Note (Signed)
Unchanged. Pt on increased pain med due to recent brain surgery, needs caution with this as she has had multiple unintentional "overdoses" on meds. Management is through pain clinic

## 2011-12-28 NOTE — Assessment & Plan Note (Signed)
Stable and followed by mental health 

## 2011-12-28 NOTE — Assessment & Plan Note (Signed)
Controlled, no change in medication Patient advised to reduce carb and sweets, commit to regular physical activity, take meds as prescribed, test blood as directed, and attempt to lose weight, to improve blood sugar control.  

## 2011-12-28 NOTE — Assessment & Plan Note (Signed)
Increased leg pain , requests topical med, voltaren prescribed for topical as needed use

## 2011-12-28 NOTE — Assessment & Plan Note (Signed)
Last tetanus over 8 years ago will administer TdaP today. Topical antibiotic provided for twice daily use fo 3 to 5 days. No signs of wound infection currently

## 2011-12-28 NOTE — Assessment & Plan Note (Signed)
Safety in the home, and use of an assistive device encouraged to prevent falls/reduce the risk Pt recently suffered head trauma reportedly attempting to work n her vacuum cleaner, she has been home for 4 days from rehab for brain surgery. i advised her to limit physical activity, and also to head trauma as much as possible C/o forgetfulness and difficulty with speech, will request eval by her home health company for in house pt/ot

## 2011-12-28 NOTE — Assessment & Plan Note (Signed)
Controlled, no change in medication DASH diet and commitment to daily physical activity for a minimum of 30 minutes discussed and encouraged, as a part of hypertension management. The importance of attaining a healthy weight is also discussed.  

## 2011-12-29 ENCOUNTER — Telehealth: Payer: Self-pay | Admitting: Family Medicine

## 2011-12-29 NOTE — Addendum Note (Signed)
Addended by: Kandis Fantasia B on: 12/29/2011 01:34 PM   Modules accepted: Orders

## 2011-12-29 NOTE — Telephone Encounter (Signed)
No tele message

## 2012-01-01 ENCOUNTER — Other Ambulatory Visit (HOSPITAL_COMMUNITY): Payer: Self-pay | Admitting: Neurosurgery

## 2012-01-01 DIAGNOSIS — D320 Benign neoplasm of cerebral meninges: Secondary | ICD-10-CM

## 2012-01-03 ENCOUNTER — Telehealth: Payer: Self-pay | Admitting: Family Medicine

## 2012-01-03 NOTE — Telephone Encounter (Signed)
Verbal order given  

## 2012-01-04 MED ORDER — FLUCONAZOLE 150 MG PO TABS: 150.0000 mg | ORAL_TABLET | Freq: Once | ORAL | Status: AC

## 2012-01-04 NOTE — Telephone Encounter (Signed)
Med sent.

## 2012-01-04 NOTE — Telephone Encounter (Signed)
States when she was in [place] she was put on antibiotics for UTI until she was discharged and now she has yeast infection. Wants to see is something can be sent in to [facility]

## 2012-01-04 NOTE — Addendum Note (Signed)
Addended by: Milinda Antis F on: 01/04/2012 05:03 PM   Modules accepted: Orders

## 2012-01-08 ENCOUNTER — Telehealth: Payer: Self-pay | Admitting: Family Medicine

## 2012-01-08 ENCOUNTER — Other Ambulatory Visit: Payer: Self-pay | Admitting: Family Medicine

## 2012-01-09 ENCOUNTER — Ambulatory Visit (HOSPITAL_COMMUNITY)
Admission: RE | Admit: 2012-01-09 | Discharge: 2012-01-09 | Disposition: A | Discharge status: Home / Self Care | Payer: Medicare Other | Source: Ambulatory Visit | Attending: Neurosurgery | Admitting: Neurosurgery

## 2012-01-09 DIAGNOSIS — R51 Headache: Secondary | ICD-10-CM | POA: Insufficient documentation

## 2012-01-09 DIAGNOSIS — D320 Benign neoplasm of cerebral meninges: Secondary | ICD-10-CM | POA: Insufficient documentation

## 2012-01-09 DIAGNOSIS — Z9889 Other specified postprocedural states: Secondary | ICD-10-CM | POA: Insufficient documentation

## 2012-01-10 ENCOUNTER — Telehealth: Payer: Self-pay | Admitting: Family Medicine

## 2012-01-10 NOTE — Telephone Encounter (Signed)
rx to be sent as soon as pt calls back with pharmacy she would like it sent in to

## 2012-01-12 ENCOUNTER — Ambulatory Visit: Payer: Medicare Other | Admitting: Family Medicine

## 2012-01-12 NOTE — Telephone Encounter (Signed)
Done

## 2012-01-16 ENCOUNTER — Other Ambulatory Visit (HOSPITAL_COMMUNITY): Payer: Medicare Other

## 2012-01-22 ENCOUNTER — Other Ambulatory Visit: Payer: Self-pay | Admitting: Family Medicine

## 2012-01-24 ENCOUNTER — Telehealth: Payer: Self-pay | Admitting: Family Medicine

## 2012-01-24 NOTE — Telephone Encounter (Signed)
[name] has never received her bed and she called her insurance and they told her that [facility] would be the one to supply her with one. I called [facility] and they need a prescription and diagnosis for the hospital bed and also the office notes which support the need faxed to [phone]

## 2012-01-30 ENCOUNTER — Telehealth: Payer: Self-pay | Admitting: Family Medicine

## 2012-02-05 ENCOUNTER — Telehealth: Payer: Self-pay

## 2012-02-05 NOTE — Telephone Encounter (Signed)
Patient aware.

## 2012-02-05 NOTE — Telephone Encounter (Signed)
Patient states that she will consult dermatology.

## 2012-02-05 NOTE — Telephone Encounter (Signed)
This is from the congestion and blowing hard, she can use nasal saline to thin out mucous Get humidifier Use vaseline in nares for moisture

## 2012-02-07 ENCOUNTER — Telehealth: Payer: Self-pay | Admitting: Family Medicine

## 2012-02-07 LAB — COMPLETE METABOLIC PANEL WITH GFR
ALT: 32 U/L (ref 0–35)
Alkaline Phosphatase: 106 U/L (ref 39–117)
CO2: 28 mEq/L (ref 19–32)
Creat: 0.64 mg/dL (ref 0.50–1.10)
GFR, Est African American: >89 mL/min
GFR, Est Non African American: >89 mL/min
Sodium: 143 mEq/L (ref 135–145)
Total Bilirubin: 0.3 mg/dL (ref 0.3–1.2)
Total Protein: 6.9 g/dL (ref 6.0–8.3)

## 2012-02-07 LAB — HEMOGLOBIN A1C
Hgb A1c MFr Bld: 5.9 % — ABNORMAL HIGH (ref <5.7)
Mean Plasma Glucose: 123 mg/dL — ABNORMAL HIGH (ref <117)

## 2012-02-07 NOTE — Telephone Encounter (Signed)
Called patient and left message for them to return call at the office   

## 2012-02-07 NOTE — Telephone Encounter (Signed)
Pt wanted a med list faxed to her.

## 2012-02-09 ENCOUNTER — Other Ambulatory Visit (HOSPITAL_COMMUNITY): Payer: Medicare Other

## 2012-02-10 IMAGING — US US EXTREM LOW VENOUS BILAT
1 series · 13 of 24 positions shown · non-contrast
Comparison: None.

CLINICAL DATA: Right leg pain and bilateral swelling.

BILATERAL LOWER EXTREMITY VENOUS DUPLEX ULTRASOUND
TECHNIQUE: Gray-scale sonography with graded compression, as well
as color Doppler and duplex ultrasound, were performed to evaluate
the deep venous system of both lower extremities from the level of
the common femoral vein through the popliteal and proximal calf
veins.  Spectral Doppler was utilized to evaluate flow at rest and
with distal augmentation maneuvers.

[Series 1: us extrem low venous bilat · 13 of 61 slices shown]
[im 1/61]
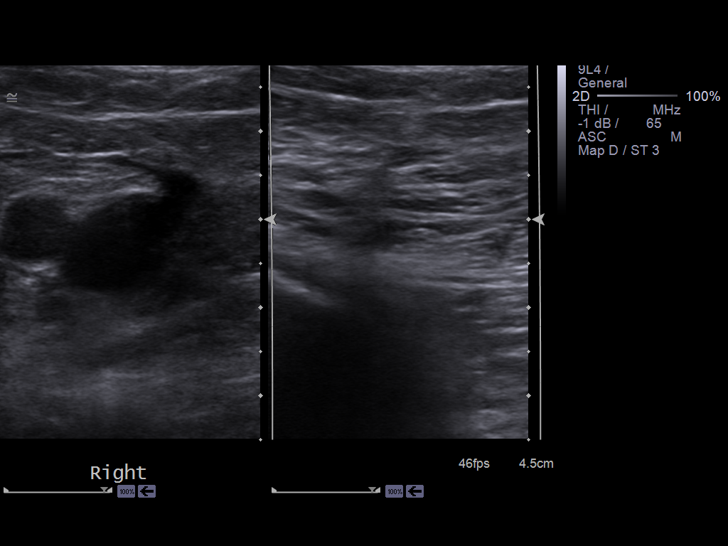
[im 6/61]
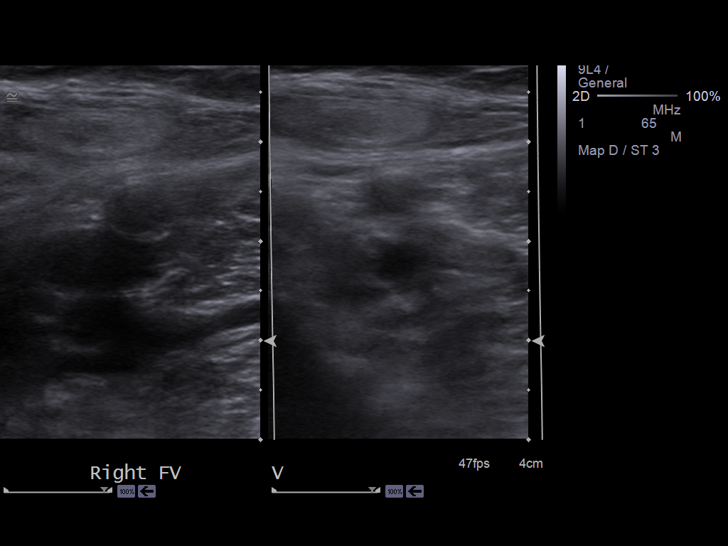
[im 11/61]
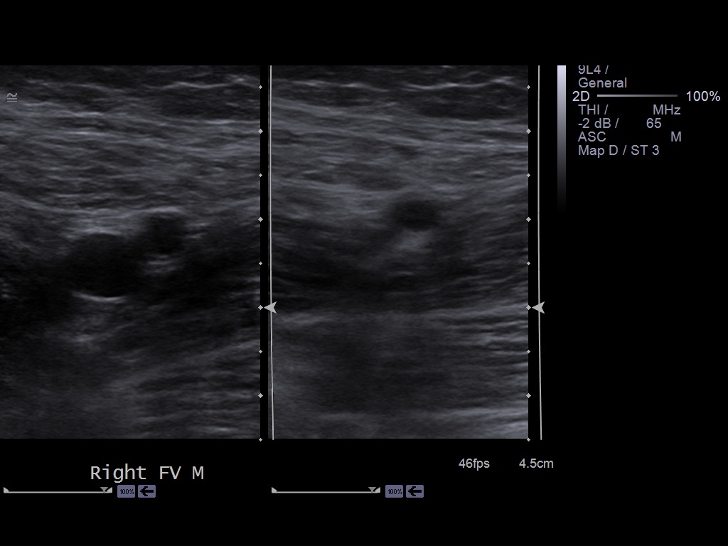
[im 16/61]
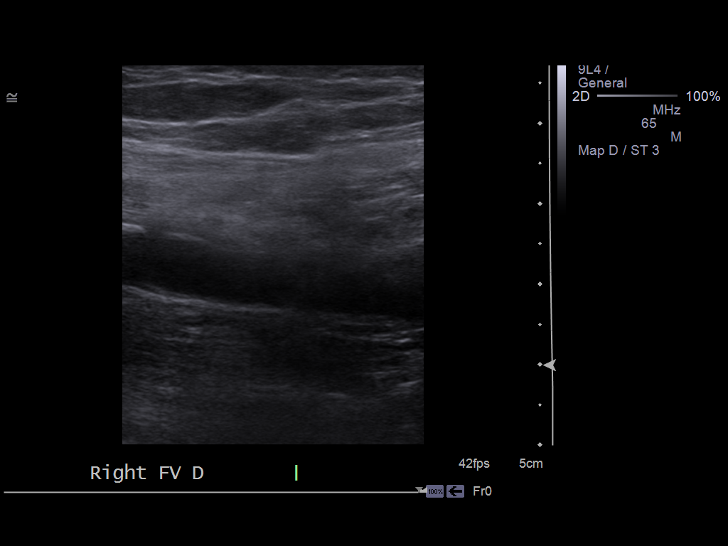
[im 21/61]
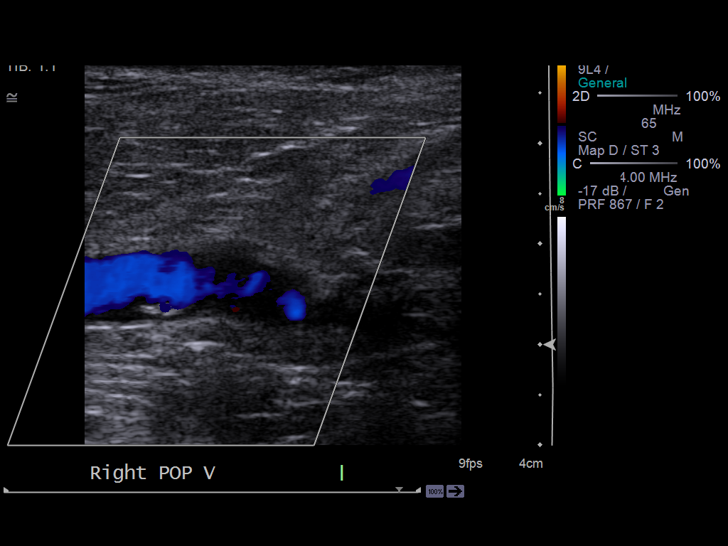
[im 27/61]
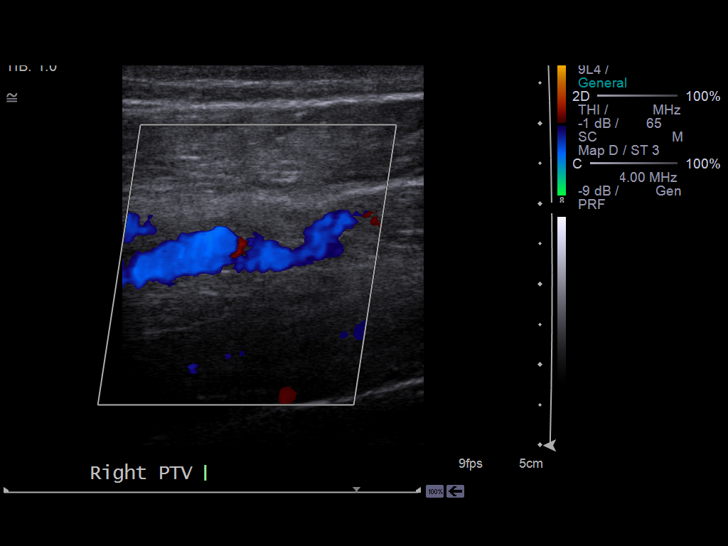
[im 32/61]
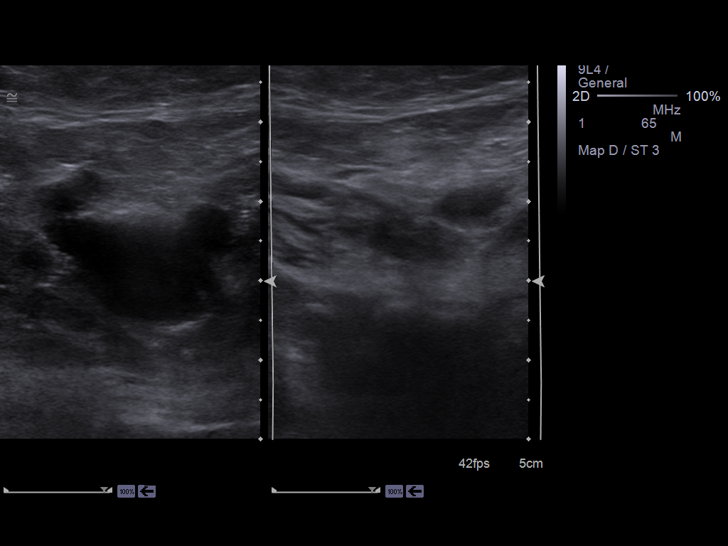
[im 34/61]
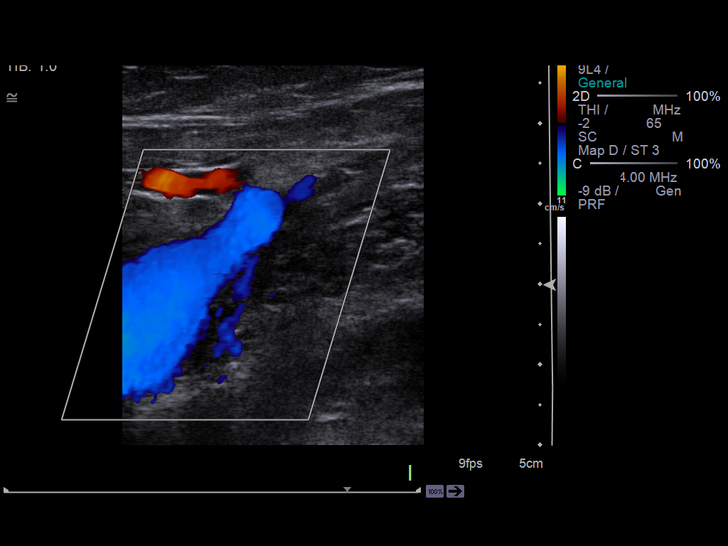
[im 40/61]
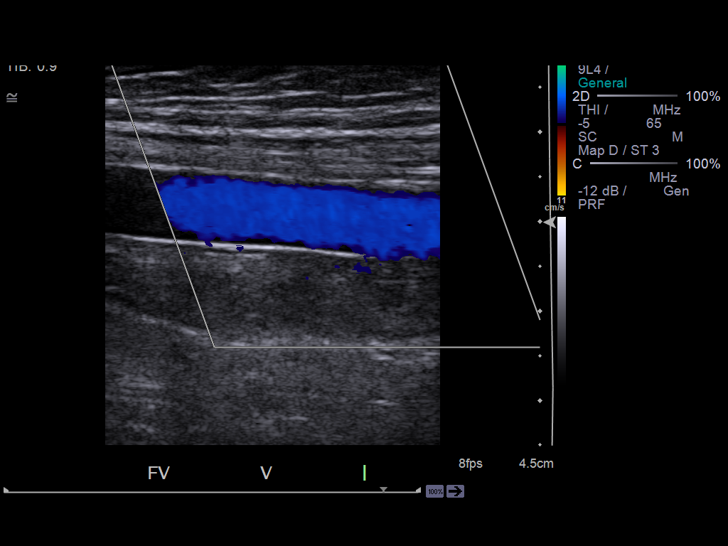
[im 45/61]
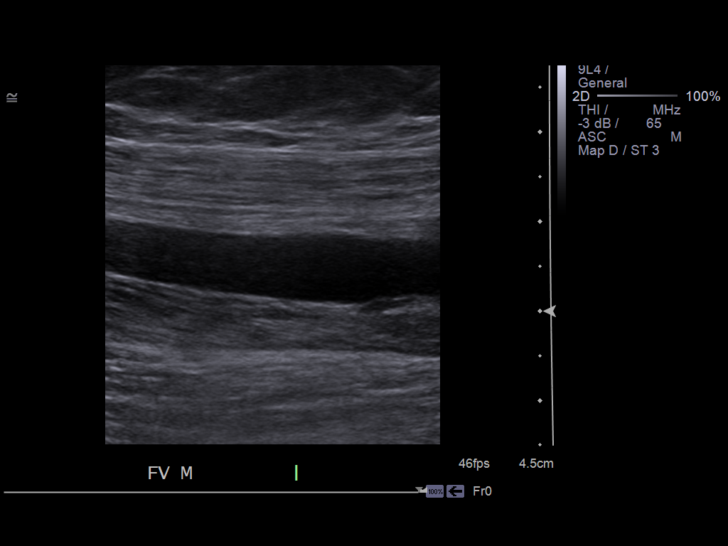
[im 50/61]
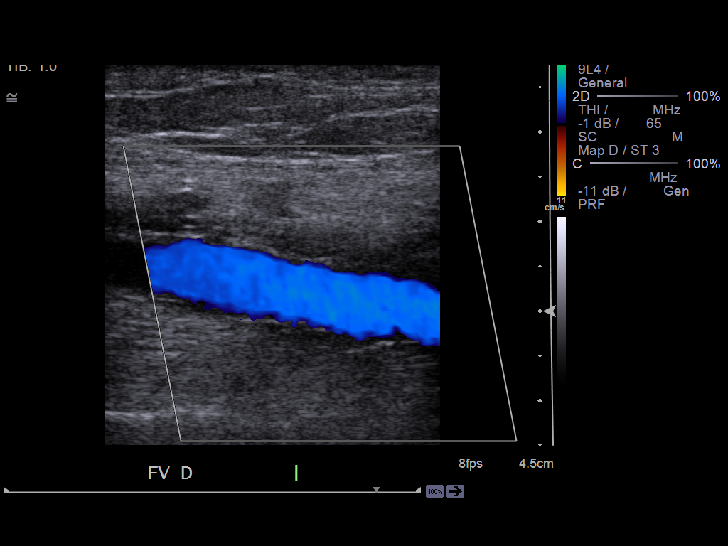
[im 55/61]
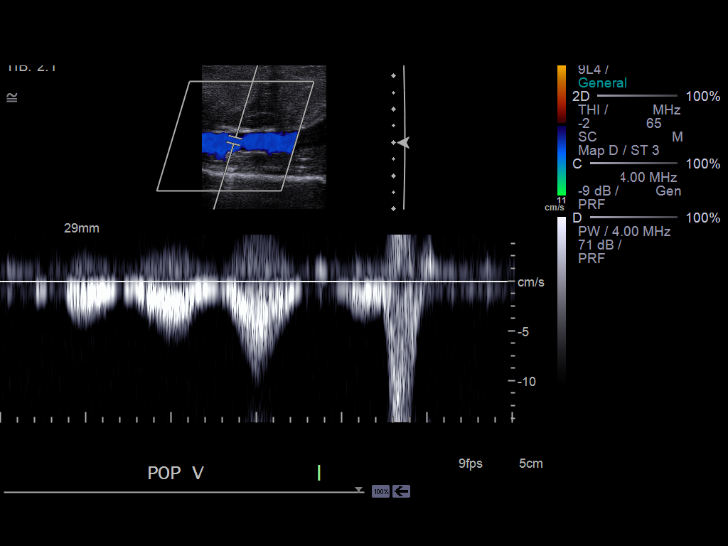
[im 61/61]
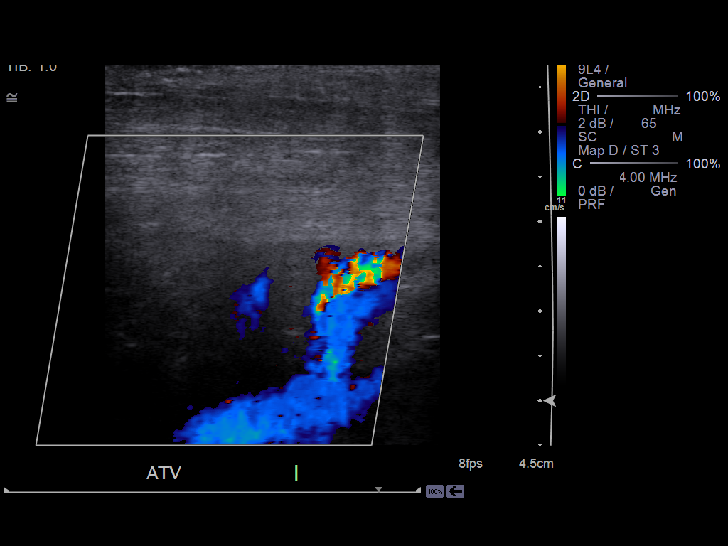

[13 of 24 positions shown; findings below may reference images not displayed]

FINDINGS: Normal compressibility of bilateral common femoral,
superficial femoral, and popliteal veins is demonstrated, as well
as the visualized proximal calf veins.  No filling defects to
suggest DVT on grayscale or color Doppler imaging.  Doppler
waveforms show normal direction of venous flow, normal respiratory
phasicity and response to augmentation.

There is a small area of echogenicity along the wall of the right
popliteal vein.  This probably represents a small old thrombus.
IMPRESSION: No evidence of bilateral lower extremity acute deep vein
thrombosis.

Small echogenic structure in the right popliteal vein probably
represents a nonocclusive chronic thrombus.

## 2012-02-13 ENCOUNTER — Telehealth: Payer: Self-pay

## 2012-02-13 ENCOUNTER — Other Ambulatory Visit: Payer: Self-pay | Admitting: Family Medicine

## 2012-02-13 MED ORDER — GLIPIZIDE ER 5 MG PO TB24: 5.0000 mg | ORAL_TABLET | Freq: Every day | ORAL | Status: DC

## 2012-02-13 NOTE — Telephone Encounter (Signed)
pls advise nurse pharmacy  and pt stop onglyza and glipizide 10mg  . Start glipizide 5 mg , continue metformin as before. Pt also needs to understand importance of eating around the same amt of carbs on a daily basis since so much fluctuation noted in blood sugar. Mosst recent HBa1C was 5.9,I believe she can maintain good blood sugar control on these lower doses  Pls fax printed script after you spk with people, mentioned above

## 2012-02-13 NOTE — Telephone Encounter (Signed)
[name] from [employer] called and said she has been checking on her sugar readings on her meter and she has several fasting lows of 21, 64, 77 etc but some are up to around 190. Takes metformin, glipizide and onglyza. Britt Boozer wants to know if you want to decrease any of her meds? [phone]

## 2012-02-14 ENCOUNTER — Encounter: Payer: Self-pay | Admitting: Family Medicine

## 2012-02-14 ENCOUNTER — Ambulatory Visit (INDEPENDENT_AMBULATORY_CARE_PROVIDER_SITE_OTHER): Payer: Medicare Other | Admitting: Family Medicine

## 2012-02-14 VITALS — BP 110/62 | HR 88 | Resp 15 | Ht 62.0 in | Wt 145.1 lb

## 2012-02-14 DIAGNOSIS — E785 Hyperlipidemia, unspecified: Secondary | ICD-10-CM

## 2012-02-14 DIAGNOSIS — J019 Acute sinusitis, unspecified: Secondary | ICD-10-CM

## 2012-02-14 DIAGNOSIS — J209 Acute bronchitis, unspecified: Secondary | ICD-10-CM

## 2012-02-14 DIAGNOSIS — M549 Dorsalgia, unspecified: Secondary | ICD-10-CM

## 2012-02-14 DIAGNOSIS — I10 Essential (primary) hypertension: Secondary | ICD-10-CM

## 2012-02-14 DIAGNOSIS — L259 Unspecified contact dermatitis, unspecified cause: Secondary | ICD-10-CM

## 2012-02-14 DIAGNOSIS — B369 Superficial mycosis, unspecified: Secondary | ICD-10-CM | POA: Insufficient documentation

## 2012-02-14 DIAGNOSIS — L309 Dermatitis, unspecified: Secondary | ICD-10-CM

## 2012-02-14 DIAGNOSIS — F319 Bipolar disorder, unspecified: Secondary | ICD-10-CM

## 2012-02-14 DIAGNOSIS — E119 Type 2 diabetes mellitus without complications: Secondary | ICD-10-CM

## 2012-02-14 IMAGING — CR DG ANKLE COMPLETE 3+V*R*
3 series · 3 of 3 positions shown · non-contrast
Comparison: None.

CLINICAL DATA: Status post fall, right ankle pain.

RIGHT ANKLE - COMPLETE 3+ VIEW

[view not recorded (1 of 3)]
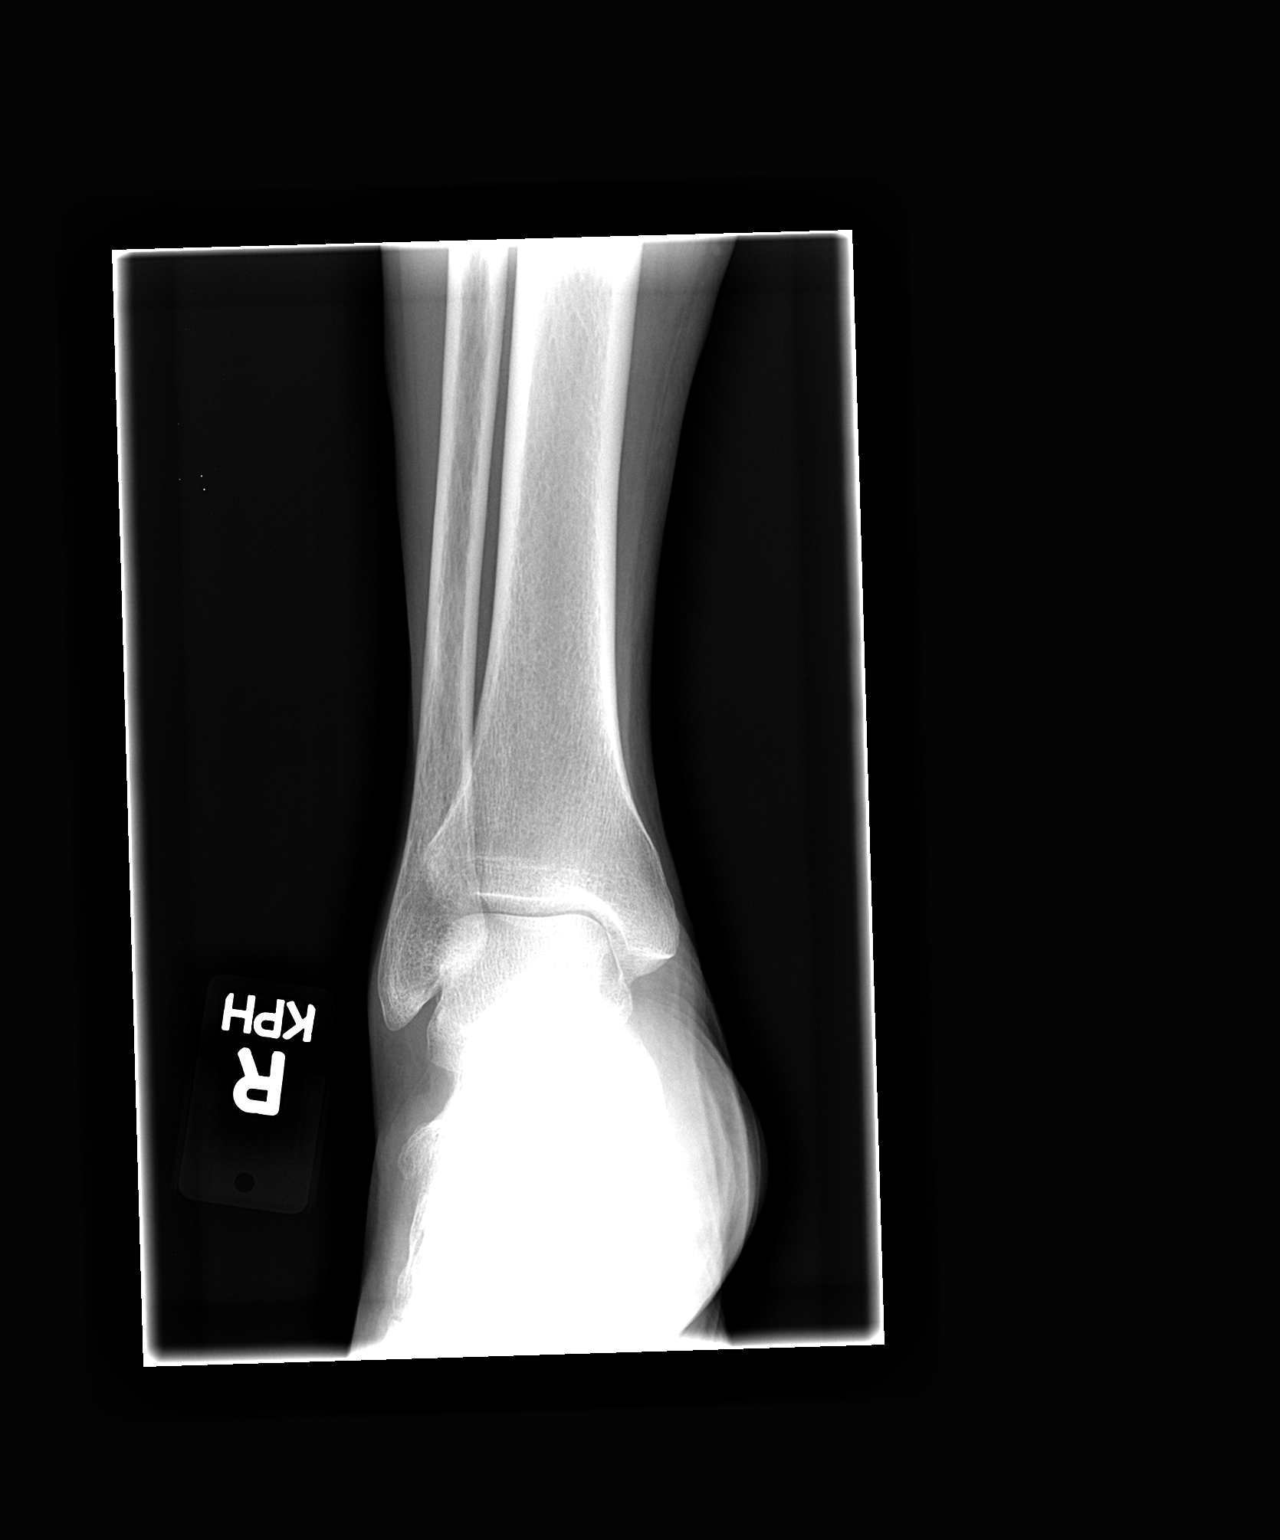

[view not recorded (2 of 3)]
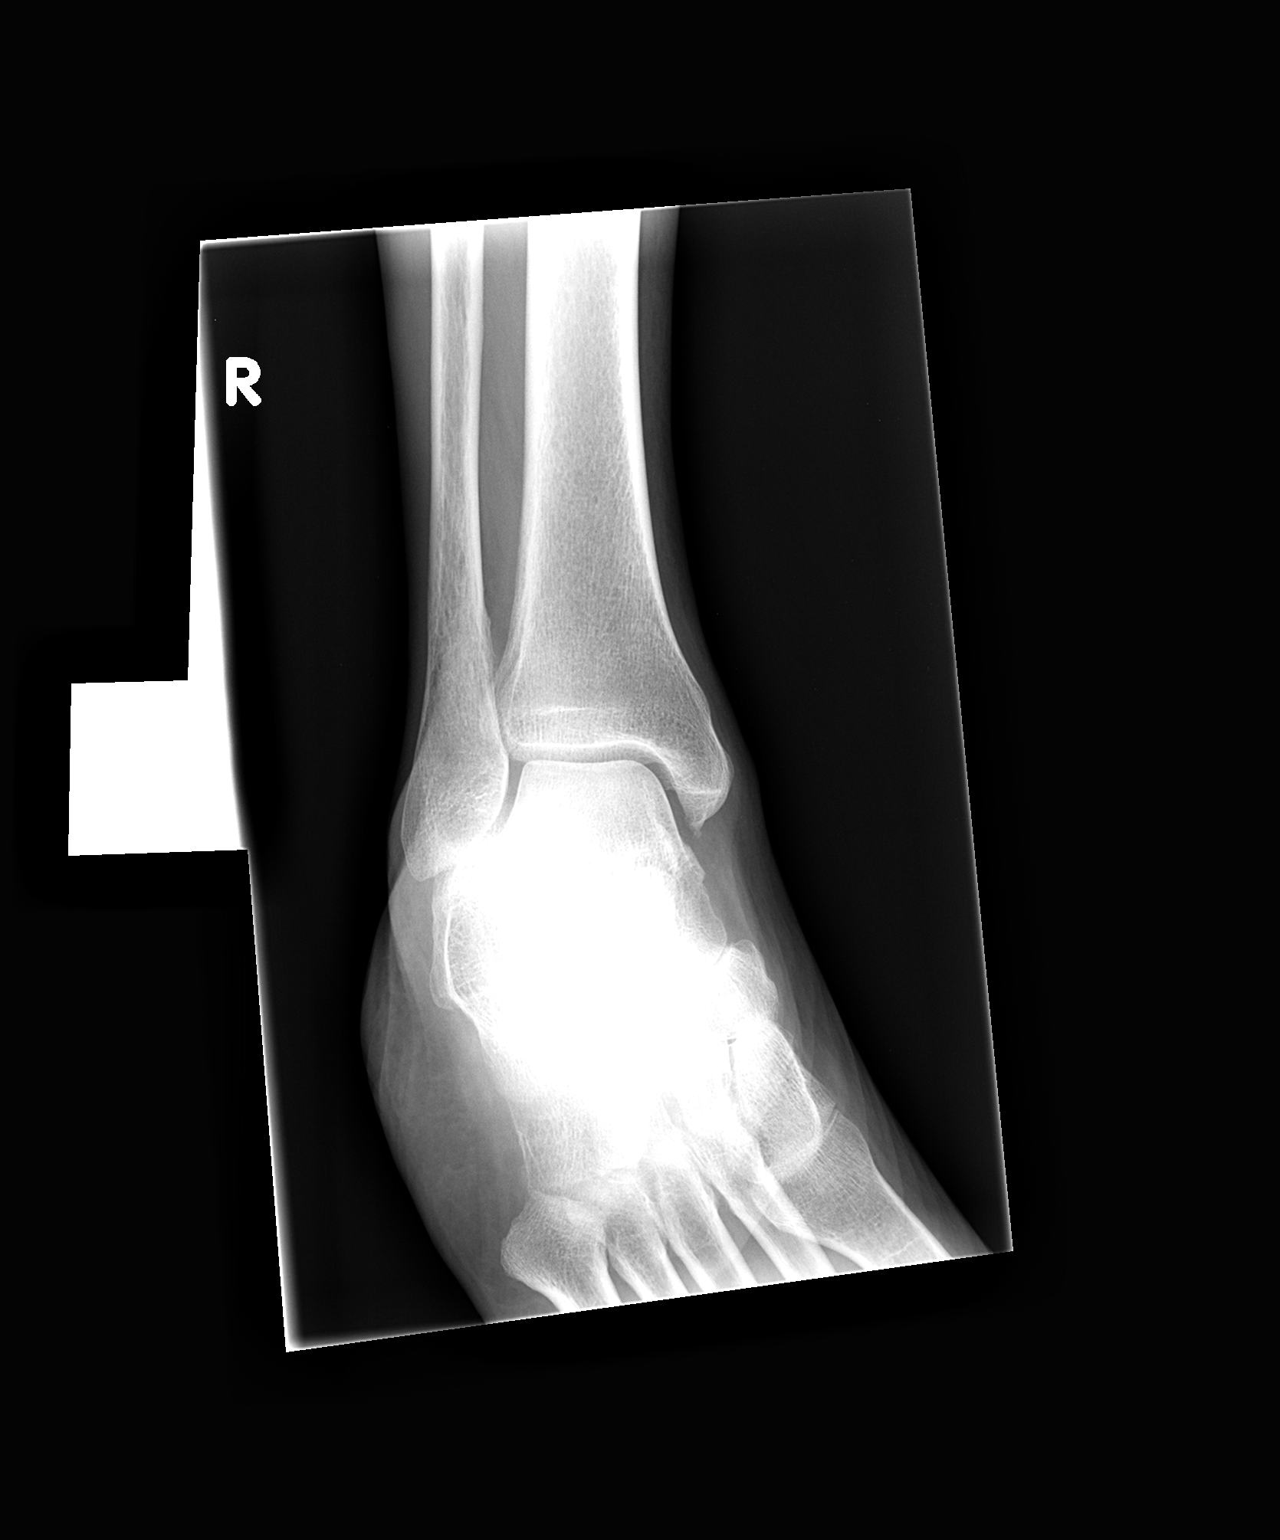

[view not recorded (3 of 3)]
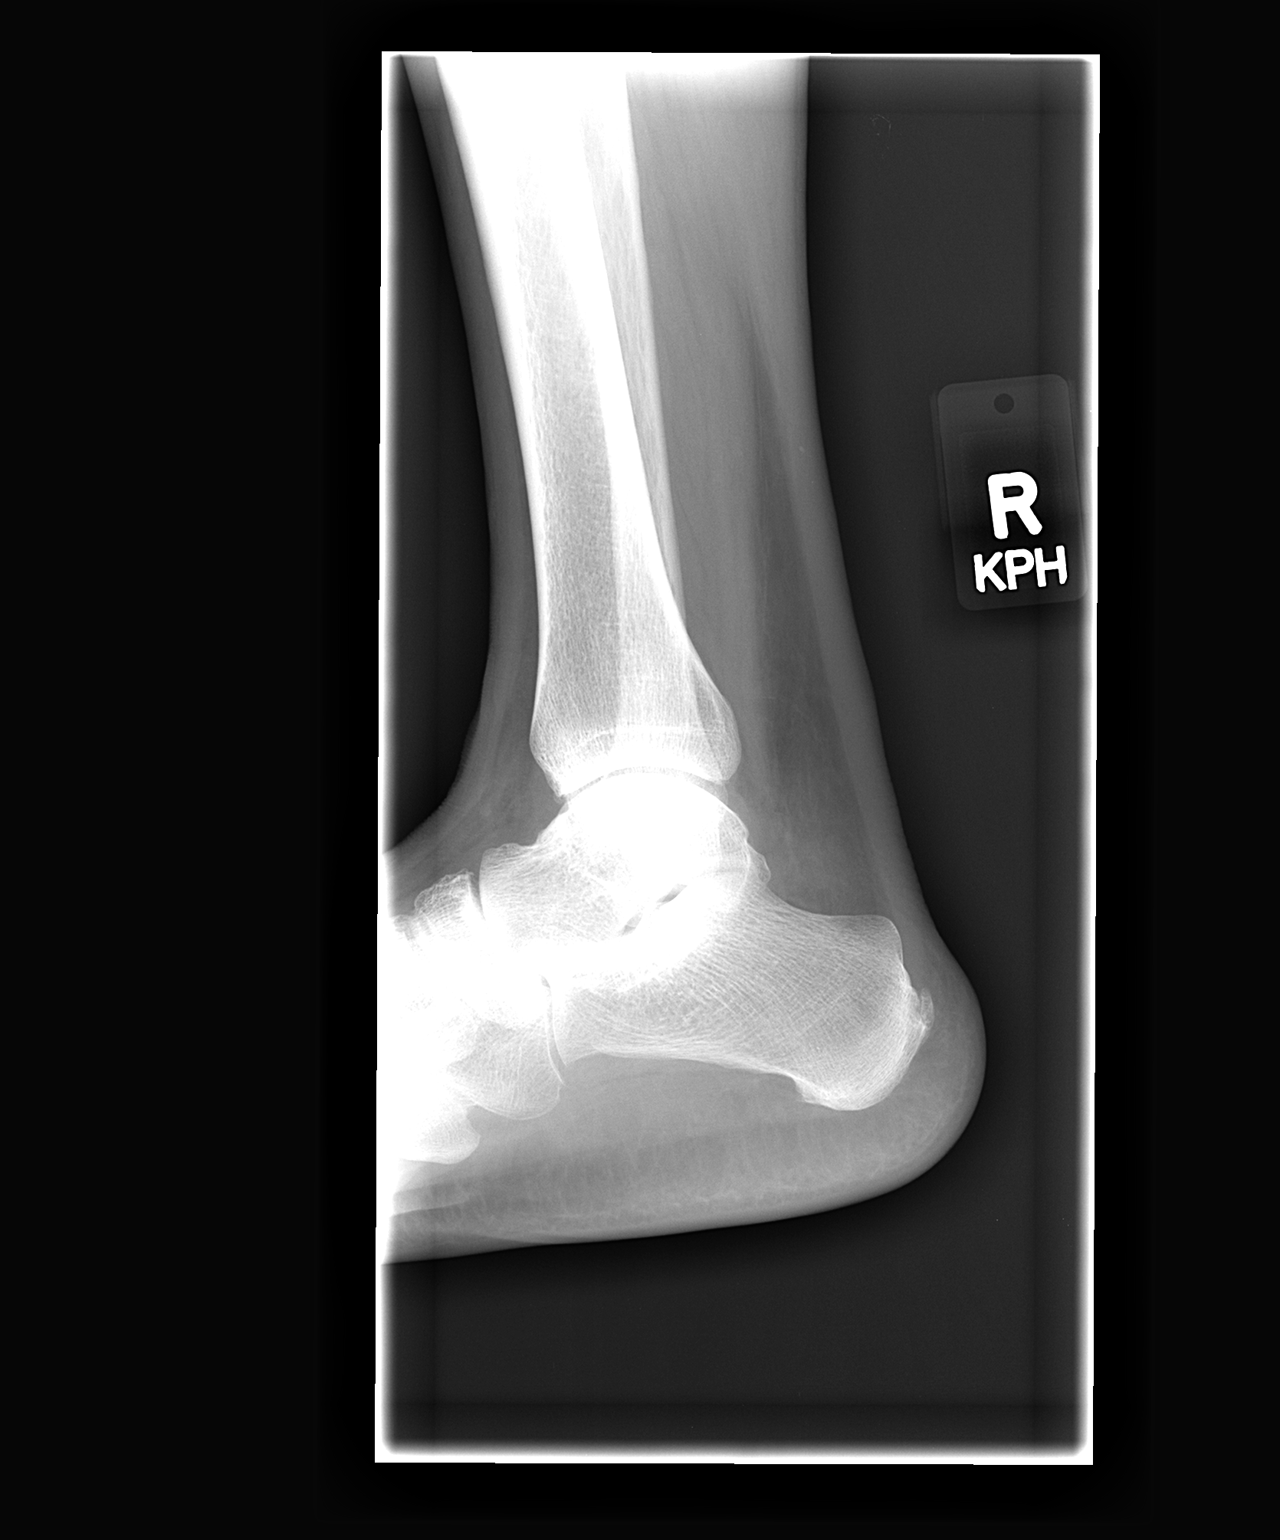

[3 of 3 positions shown; findings below may reference images not displayed]

FINDINGS: No acute fracture or dislocation.  Ankle mortise remains
intact.  Mild spurring of the posterior calcaneus.  No significant
soft tissue swelling.
IMPRESSION: No acute osseous abnormality.

## 2012-02-14 MED ORDER — SULFAMETHOXAZOLE-TRIMETHOPRIM 800-160 MG PO TABS: 1.0000 | ORAL_TABLET | Freq: Two times a day (BID) | ORAL | Status: DC

## 2012-02-14 MED ORDER — CLOTRIMAZOLE-BETAMETHASONE 1-0.05 % EX CREA: TOPICAL_CREAM | Freq: Two times a day (BID) | CUTANEOUS | Status: AC

## 2012-02-14 MED ORDER — BENZONATATE 100 MG PO CAPS: 100.0000 mg | ORAL_CAPSULE | Freq: Four times a day (QID) | ORAL | Status: DC | PRN

## 2012-02-14 NOTE — Telephone Encounter (Signed)
nicky aware and she will go fix [name] pills this am

## 2012-02-14 NOTE — Progress Notes (Signed)
  Subjective:    Patient ID: [name], female    DOB: December 18, 1950, 61 y.o.   MRN: [id number]  HPI The PT is here for follow up and re-evaluation of chronic medical conditions, medication management and review of any available recent lab and radiology data.  Preventive health is updated, specifically  Cancer screening and Immunization.   Questions or concerns regarding consultations or procedures which the PT has had in the interim are  addressed. The PT denies any adverse reactions to current medications since the last visit.  There are no new concerns.  There are no specific complaints 5 day h/o increased sinus pressure with yellow drainage and chills as well as productive cough, uncertain as to whether she has had  Fever. Reports episodes of low sugar, and has poor appetite and weight loss since recent cranial surgery.       Review of Systems See HPI  Denies chest pains, palpitations and leg swelling Denies abdominal pain, nausea, vomiting,diarrhea or constipation.   Denies dysuria, frequency, hesitancy or incontinence. Denies headaches, seizures, numbness, or tingling.  uncontrolled depression, anxiety or insomnia. C/o dry itchy skin with rash        Objective:   Physical Exam  Patient alert and oriented and in no cardiopulmonary distress.Ill appearing  HEENT: No facial asymmetry, EOMI, maxillary  sinus tenderness,  oropharynx pink and moist.  Neck supple no adenopathy. Decreased air entry , scattered crackles no wheezesChest: Clear to auscultation bilaterally.  CVS: S1, S2 no murmurs, no S3.  ABD: Soft non tender. Bowel sounds normal.  Ext: No edema  MS: Decreased ROM spine, shoulders, hips and knees.  Skin: Intact, dry with fungal rash  Psych: Good eye contact,   Memory impaired  not anxious or depressed appearing.Flaat affect  CNS: CN 2-12 intact, power,  normal throughout.       Assessment & Plan:

## 2012-02-14 NOTE — Patient Instructions (Addendum)
Annual wellness in 3.5 month  Changes have been made to your diabetes medication since you have lost weight  It is important that you eat regularly on schedule   Fasting lipid, cmp and eGFR in 3.5 month  You will have a script for zostavax sent to your pharmacy, for administration in 2 to 3 weeks, when you are better if you can afford  You are being treated for acute sinusitis and bronchitis, also for fungal skin infection, I hope you fell better soon  Your skin is very dry, please use a moisturizing lotion /baby oil twice daily, and bathe in tepid, not too hot, water

## 2012-02-15 ENCOUNTER — Emergency Department (HOSPITAL_COMMUNITY): Payer: Medicare Other

## 2012-02-15 ENCOUNTER — Encounter (HOSPITAL_COMMUNITY): Payer: Self-pay | Admitting: Emergency Medicine

## 2012-02-15 ENCOUNTER — Inpatient Hospital Stay (HOSPITAL_COMMUNITY)
Admission: EM | Admit: 2012-02-15 | Discharge: 2012-02-17 | DRG: 918 | Disposition: A | Discharge status: Home / Self Care | Payer: Medicare Other | Attending: Internal Medicine | Admitting: Internal Medicine

## 2012-02-15 DIAGNOSIS — S01112A Laceration without foreign body of left eyelid and periocular area, initial encounter: Secondary | ICD-10-CM

## 2012-02-15 DIAGNOSIS — R3915 Urgency of urination: Secondary | ICD-10-CM

## 2012-02-15 DIAGNOSIS — N179 Acute kidney failure, unspecified: Secondary | ICD-10-CM | POA: Diagnosis present

## 2012-02-15 DIAGNOSIS — R599 Enlarged lymph nodes, unspecified: Secondary | ICD-10-CM

## 2012-02-15 DIAGNOSIS — M79641 Pain in right hand: Secondary | ICD-10-CM

## 2012-02-15 DIAGNOSIS — M7150 Other bursitis, not elsewhere classified, unspecified site: Secondary | ICD-10-CM

## 2012-02-15 DIAGNOSIS — N19 Unspecified kidney failure: Secondary | ICD-10-CM

## 2012-02-15 DIAGNOSIS — H9190 Unspecified hearing loss, unspecified ear: Secondary | ICD-10-CM

## 2012-02-15 DIAGNOSIS — I10 Essential (primary) hypertension: Secondary | ICD-10-CM

## 2012-02-15 DIAGNOSIS — F39 Unspecified mood [affective] disorder: Secondary | ICD-10-CM | POA: Diagnosis present

## 2012-02-15 DIAGNOSIS — R269 Unspecified abnormalities of gait and mobility: Secondary | ICD-10-CM

## 2012-02-15 DIAGNOSIS — R4182 Altered mental status, unspecified: Secondary | ICD-10-CM

## 2012-02-15 DIAGNOSIS — Y92009 Unspecified place in unspecified non-institutional (private) residence as the place of occurrence of the external cause: Secondary | ICD-10-CM

## 2012-02-15 DIAGNOSIS — D509 Iron deficiency anemia, unspecified: Secondary | ICD-10-CM

## 2012-02-15 DIAGNOSIS — D329 Benign neoplasm of meninges, unspecified: Secondary | ICD-10-CM

## 2012-02-15 DIAGNOSIS — R109 Unspecified abdominal pain: Secondary | ICD-10-CM

## 2012-02-15 DIAGNOSIS — Z91011 Allergy to milk products: Secondary | ICD-10-CM

## 2012-02-15 DIAGNOSIS — M79605 Pain in left leg: Secondary | ICD-10-CM

## 2012-02-15 DIAGNOSIS — R5381 Other malaise: Secondary | ICD-10-CM

## 2012-02-15 DIAGNOSIS — D320 Benign neoplasm of cerebral meninges: Secondary | ICD-10-CM

## 2012-02-15 DIAGNOSIS — H409 Unspecified glaucoma: Secondary | ICD-10-CM | POA: Diagnosis present

## 2012-02-15 DIAGNOSIS — R0609 Other forms of dyspnea: Secondary | ICD-10-CM | POA: Diagnosis present

## 2012-02-15 DIAGNOSIS — Z9889 Other specified postprocedural states: Secondary | ICD-10-CM

## 2012-02-15 DIAGNOSIS — R141 Gas pain: Secondary | ICD-10-CM

## 2012-02-15 DIAGNOSIS — E669 Obesity, unspecified: Secondary | ICD-10-CM

## 2012-02-15 DIAGNOSIS — D649 Anemia, unspecified: Secondary | ICD-10-CM

## 2012-02-15 DIAGNOSIS — F411 Generalized anxiety disorder: Secondary | ICD-10-CM | POA: Diagnosis present

## 2012-02-15 DIAGNOSIS — T7840XA Allergy, unspecified, initial encounter: Secondary | ICD-10-CM

## 2012-02-15 DIAGNOSIS — T50991A Poisoning by other drugs, medicaments and biological substances, accidental (unintentional), initial encounter: Principal | ICD-10-CM | POA: Diagnosis present

## 2012-02-15 DIAGNOSIS — K589 Irritable bowel syndrome without diarrhea: Secondary | ICD-10-CM

## 2012-02-15 DIAGNOSIS — K219 Gastro-esophageal reflux disease without esophagitis: Secondary | ICD-10-CM

## 2012-02-15 DIAGNOSIS — M25561 Pain in right knee: Secondary | ICD-10-CM

## 2012-02-15 DIAGNOSIS — G4730 Sleep apnea, unspecified: Secondary | ICD-10-CM

## 2012-02-15 DIAGNOSIS — M25579 Pain in unspecified ankle and joints of unspecified foot: Secondary | ICD-10-CM

## 2012-02-15 DIAGNOSIS — G43909 Migraine, unspecified, not intractable, without status migrainosus: Secondary | ICD-10-CM | POA: Diagnosis present

## 2012-02-15 DIAGNOSIS — M5030 Other cervical disc degeneration, unspecified cervical region: Secondary | ICD-10-CM

## 2012-02-15 DIAGNOSIS — J019 Acute sinusitis, unspecified: Secondary | ICD-10-CM

## 2012-02-15 DIAGNOSIS — Z862 Personal history of diseases of the blood and blood-forming organs and certain disorders involving the immune mechanism: Secondary | ICD-10-CM

## 2012-02-15 DIAGNOSIS — R1011 Right upper quadrant pain: Secondary | ICD-10-CM

## 2012-02-15 DIAGNOSIS — R7302 Impaired glucose tolerance (oral): Secondary | ICD-10-CM | POA: Diagnosis present

## 2012-02-15 DIAGNOSIS — R079 Chest pain, unspecified: Secondary | ICD-10-CM

## 2012-02-15 DIAGNOSIS — G4700 Insomnia, unspecified: Secondary | ICD-10-CM

## 2012-02-15 DIAGNOSIS — F060 Psychotic disorder with hallucinations due to known physiological condition: Secondary | ICD-10-CM

## 2012-02-15 DIAGNOSIS — R0689 Other abnormalities of breathing: Secondary | ICD-10-CM

## 2012-02-15 DIAGNOSIS — E119 Type 2 diabetes mellitus without complications: Secondary | ICD-10-CM

## 2012-02-15 DIAGNOSIS — F319 Bipolar disorder, unspecified: Secondary | ICD-10-CM

## 2012-02-15 DIAGNOSIS — E860 Dehydration: Secondary | ICD-10-CM | POA: Diagnosis present

## 2012-02-15 DIAGNOSIS — K3184 Gastroparesis: Secondary | ICD-10-CM

## 2012-02-15 DIAGNOSIS — E039 Hypothyroidism, unspecified: Secondary | ICD-10-CM | POA: Diagnosis present

## 2012-02-15 DIAGNOSIS — R1084 Generalized abdominal pain: Secondary | ICD-10-CM

## 2012-02-15 DIAGNOSIS — Z8673 Personal history of transient ischemic attack (TIA), and cerebral infarction without residual deficits: Secondary | ICD-10-CM

## 2012-02-15 DIAGNOSIS — R51 Headache: Secondary | ICD-10-CM

## 2012-02-15 DIAGNOSIS — Z8639 Personal history of other endocrine, nutritional and metabolic disease: Secondary | ICD-10-CM

## 2012-02-15 DIAGNOSIS — M810 Age-related osteoporosis without current pathological fracture: Secondary | ICD-10-CM

## 2012-02-15 DIAGNOSIS — S4350XA Sprain of unspecified acromioclavicular joint, initial encounter: Secondary | ICD-10-CM

## 2012-02-15 DIAGNOSIS — K0510 Chronic gingivitis, plaque induced: Secondary | ICD-10-CM

## 2012-02-15 DIAGNOSIS — E785 Hyperlipidemia, unspecified: Secondary | ICD-10-CM

## 2012-02-15 DIAGNOSIS — L309 Dermatitis, unspecified: Secondary | ICD-10-CM

## 2012-02-15 DIAGNOSIS — K5730 Diverticulosis of large intestine without perforation or abscess without bleeding: Secondary | ICD-10-CM

## 2012-02-15 DIAGNOSIS — B369 Superficial mycosis, unspecified: Secondary | ICD-10-CM

## 2012-02-15 DIAGNOSIS — H547 Unspecified visual loss: Secondary | ICD-10-CM

## 2012-02-15 DIAGNOSIS — Z79899 Other long term (current) drug therapy: Secondary | ICD-10-CM

## 2012-02-15 DIAGNOSIS — J209 Acute bronchitis, unspecified: Secondary | ICD-10-CM

## 2012-02-15 DIAGNOSIS — F17200 Nicotine dependence, unspecified, uncomplicated: Secondary | ICD-10-CM | POA: Diagnosis present

## 2012-02-15 DIAGNOSIS — G9341 Metabolic encephalopathy: Secondary | ICD-10-CM

## 2012-02-15 DIAGNOSIS — H6690 Otitis media, unspecified, unspecified ear: Secondary | ICD-10-CM

## 2012-02-15 DIAGNOSIS — M549 Dorsalgia, unspecified: Secondary | ICD-10-CM

## 2012-02-15 DIAGNOSIS — R296 Repeated falls: Secondary | ICD-10-CM

## 2012-02-15 DIAGNOSIS — G8929 Other chronic pain: Secondary | ICD-10-CM | POA: Diagnosis present

## 2012-02-15 DIAGNOSIS — Z6830 Body mass index (BMI) 30.0-30.9, adult: Secondary | ICD-10-CM

## 2012-02-15 DIAGNOSIS — G5601 Carpal tunnel syndrome, right upper limb: Secondary | ICD-10-CM

## 2012-02-15 DIAGNOSIS — IMO0002 Reserved for concepts with insufficient information to code with codable children: Secondary | ICD-10-CM

## 2012-02-15 DIAGNOSIS — G5790 Unspecified mononeuropathy of unspecified lower limb: Secondary | ICD-10-CM

## 2012-02-15 DIAGNOSIS — R0989 Other specified symptoms and signs involving the circulatory and respiratory systems: Secondary | ICD-10-CM | POA: Diagnosis present

## 2012-02-15 DIAGNOSIS — R143 Flatulence: Secondary | ICD-10-CM

## 2012-02-15 LAB — CBC WITH DIFFERENTIAL/PLATELET
HCT: 33 % — ABNORMAL LOW (ref 36.0–46.0)
Hemoglobin: 9.9 g/dL — ABNORMAL LOW (ref 12.0–15.0)
Lymphs Abs: 1.9 10*3/uL (ref 0.7–4.0)
MCH: 21.9 pg — ABNORMAL LOW (ref 26.0–34.0)
MCHC: 30 g/dL (ref 30.0–36.0)
Monocytes Absolute: 0.5 10*3/uL (ref 0.1–1.0)
Monocytes Relative: 7 % (ref 3–12)
Neutro Abs: 4.9 10*3/uL (ref 1.7–7.7)
Neutrophils Relative %: 66 % (ref 43–77)
RBC: 4.52 MIL/uL (ref 3.87–5.11)

## 2012-02-15 LAB — COMPREHENSIVE METABOLIC PANEL
Alkaline Phosphatase: 85 U/L (ref 39–117)
BUN: 30 mg/dL — ABNORMAL HIGH (ref 6–23)
Chloride: 105 mEq/L (ref 96–112)
Creatinine, Ser: 1.49 mg/dL — ABNORMAL HIGH (ref 0.50–1.10)
GFR calc Af Amer: 43 mL/min — ABNORMAL LOW (ref >90)
GFR calc non Af Amer: 37 mL/min — ABNORMAL LOW (ref >90)
Glucose, Bld: 74 mg/dL (ref 70–99)
Potassium: 3.8 mEq/L (ref 3.5–5.1)
Total Bilirubin: 0.1 mg/dL — ABNORMAL LOW (ref 0.3–1.2)

## 2012-02-15 LAB — BLOOD GAS, ARTERIAL
Acid-base deficit: 0.2 mmol/L (ref 0.0–2.0)
Bicarbonate: 25.3 mEq/L — ABNORMAL HIGH (ref 20.0–24.0)
O2 Saturation: 91.4 %
Patient temperature: 37
TCO2: 24.2 mmol/L (ref 0–100)

## 2012-02-15 LAB — GLUCOSE, CAPILLARY
Glucose-Capillary: 50 mg/dL — ABNORMAL LOW (ref 70–99)
Glucose-Capillary: 98 mg/dL (ref 70–99)

## 2012-02-15 LAB — LACTIC ACID, PLASMA: Lactic Acid, Venous: 2 mmol/L (ref 0.5–2.2)

## 2012-02-15 LAB — URINALYSIS, ROUTINE W REFLEX MICROSCOPIC
Bilirubin Urine: NEGATIVE
Hgb urine dipstick: NEGATIVE
Nitrite: NEGATIVE
Protein, ur: NEGATIVE mg/dL
Specific Gravity, Urine: >1.03 — ABNORMAL HIGH (ref 1.005–1.030)
Urobilinogen, UA: 0.2 mg/dL (ref 0.0–1.0)

## 2012-02-15 LAB — AMMONIA: Ammonia: 32 umol/L (ref 11–60)

## 2012-02-15 LAB — MRSA PCR SCREENING: MRSA by PCR: POSITIVE — AB

## 2012-02-15 LAB — TROPONIN I: Troponin I: <0.3 ng/mL (ref <0.30)

## 2012-02-15 MED ORDER — METOPROLOL TARTRATE 50 MG PO TABS
50.0000 mg | ORAL_TABLET | Freq: Two times a day (BID) | ORAL | Status: DC
  Administered 2012-02-15 – 2012-02-16 (×4): 50 mg via ORAL
  Filled 2012-02-15 (×4): qty 1

## 2012-02-15 MED ORDER — LINAGLIPTIN 5 MG PO TABS
5.0000 mg | ORAL_TABLET | Freq: Every day | ORAL | Status: DC
  Administered 2012-02-16: 5 mg via ORAL
  Filled 2012-02-15 (×2): qty 1

## 2012-02-15 MED ORDER — DICLOFENAC SODIUM 1 % TD GEL
2.0000 g | Freq: Four times a day (QID) | TRANSDERMAL | Status: DC
  Administered 2012-02-15 – 2012-02-16 (×5): 2 g via TOPICAL
  Filled 2012-02-15: qty 100

## 2012-02-15 MED ORDER — CLONIDINE HCL 0.2 MG PO TABS
0.3000 mg | ORAL_TABLET | Freq: Three times a day (TID) | ORAL | Status: DC
  Administered 2012-02-15 – 2012-02-16 (×5): 0.3 mg via ORAL
  Filled 2012-02-15 (×5): qty 1

## 2012-02-15 MED ORDER — CLOPIDOGREL BISULFATE 75 MG PO TABS
75.0000 mg | ORAL_TABLET | Freq: Every day | ORAL | Status: DC
  Administered 2012-02-15 – 2012-02-16 (×2): 75 mg via ORAL
  Filled 2012-02-15 (×2): qty 1

## 2012-02-15 MED ORDER — TIZANIDINE HCL 4 MG PO TABS: 4.0000 mg | ORAL_TABLET | Freq: Three times a day (TID) | ORAL | Status: DC | PRN

## 2012-02-15 MED ORDER — MIRTAZAPINE 30 MG PO TABS
30.0000 mg | ORAL_TABLET | Freq: Every day | ORAL | Status: DC
  Administered 2012-02-15 – 2012-02-16 (×2): 30 mg via ORAL
  Filled 2012-02-15 (×2): qty 1

## 2012-02-15 MED ORDER — CLOTRIMAZOLE 1 % EX CREA
TOPICAL_CREAM | Freq: Two times a day (BID) | CUTANEOUS | Status: DC
  Administered 2012-02-15 – 2012-02-16 (×3): via TOPICAL
  Filled 2012-02-15: qty 15

## 2012-02-15 MED ORDER — TRIAMCINOLONE ACETONIDE 0.1 % EX CREA
TOPICAL_CREAM | Freq: Two times a day (BID) | CUTANEOUS | Status: DC
  Administered 2012-02-15 – 2012-02-16 (×3): via TOPICAL
  Filled 2012-02-15: qty 15

## 2012-02-15 MED ORDER — DIPHENOXYLATE-ATROPINE 2.5-0.025 MG PO TABS: 1.0000 | ORAL_TABLET | Freq: Four times a day (QID) | ORAL | Status: DC | PRN

## 2012-02-15 MED ORDER — CYCLOSPORINE 0.05 % OP EMUL
1.0000 [drp] | Freq: Two times a day (BID) | OPHTHALMIC | Status: DC
  Administered 2012-02-15 – 2012-02-16 (×3): 1 [drp] via OPHTHALMIC
  Filled 2012-02-15 (×9): qty 1

## 2012-02-15 MED ORDER — SODIUM CHLORIDE 0.9 % IV SOLN: INTRAVENOUS | Status: DC

## 2012-02-15 MED ORDER — LEVOTHYROXINE SODIUM 25 MCG PO TABS
50.0000 ug | ORAL_TABLET | ORAL | Status: DC
  Administered 2012-02-16 – 2012-02-17 (×2): 50 ug via ORAL
  Filled 2012-02-15 (×2): qty 2

## 2012-02-15 MED ORDER — PREGABALIN 75 MG PO CAPS
75.0000 mg | ORAL_CAPSULE | Freq: Two times a day (BID) | ORAL | Status: DC
  Administered 2012-02-15 – 2012-02-16 (×4): 75 mg via ORAL
  Filled 2012-02-15 (×4): qty 1

## 2012-02-15 MED ORDER — SODIUM CHLORIDE 0.9 % IV SOLN
INTRAVENOUS | Status: DC
  Administered 2012-02-15 – 2012-02-16 (×2): via INTRAVENOUS

## 2012-02-15 MED ORDER — MONTELUKAST SODIUM 10 MG PO TABS
10.0000 mg | ORAL_TABLET | Freq: Every day | ORAL | Status: DC
  Administered 2012-02-15 – 2012-02-16 (×2): 10 mg via ORAL
  Filled 2012-02-15 (×2): qty 1

## 2012-02-15 MED ORDER — MECLIZINE HCL 12.5 MG PO TABS: 12.5000 mg | ORAL_TABLET | Freq: Three times a day (TID) | ORAL | Status: DC | PRN

## 2012-02-15 MED ORDER — SODIUM CHLORIDE 0.9 % IV SOLN
Freq: Once | INTRAVENOUS | Status: AC
  Administered 2012-02-15: 10:00:00 via INTRAVENOUS

## 2012-02-15 MED ORDER — SERTRALINE HCL 50 MG PO TABS
150.0000 mg | ORAL_TABLET | Freq: Every day | ORAL | Status: DC
  Administered 2012-02-15 – 2012-02-16 (×2): 150 mg via ORAL
  Filled 2012-02-15 (×2): qty 3

## 2012-02-15 MED ORDER — INSULIN ASPART 100 UNIT/ML ~~LOC~~ SOLN
0.0000 [IU] | Freq: Three times a day (TID) | SUBCUTANEOUS | Status: DC
  Administered 2012-02-16: 3 [IU] via SUBCUTANEOUS
  Administered 2012-02-16 (×2): 4 [IU] via SUBCUTANEOUS
  Administered 2012-02-17: 3 [IU] via SUBCUTANEOUS

## 2012-02-15 MED ORDER — BENZONATATE 100 MG PO CAPS
100.0000 mg | ORAL_CAPSULE | Freq: Four times a day (QID) | ORAL | Status: DC | PRN
  Administered 2012-02-16: 100 mg via ORAL
  Filled 2012-02-15: qty 1

## 2012-02-15 MED ORDER — ATORVASTATIN CALCIUM 20 MG PO TABS
20.0000 mg | ORAL_TABLET | Freq: Every day | ORAL | Status: DC
  Administered 2012-02-15 – 2012-02-16 (×2): 20 mg via ORAL
  Filled 2012-02-15 (×2): qty 1

## 2012-02-15 MED ORDER — ACETAMINOPHEN 325 MG PO TABS
325.0000 mg | ORAL_TABLET | ORAL | Status: DC | PRN
Start: 2012-02-15 — End: 2012-02-17
  Administered 2012-02-16: 650 mg via ORAL
  Filled 2012-02-15: qty 2

## 2012-02-15 MED ORDER — PANTOPRAZOLE SODIUM 40 MG PO TBEC
40.0000 mg | DELAYED_RELEASE_TABLET | Freq: Every day | ORAL | Status: DC
  Administered 2012-02-15 – 2012-02-16 (×2): 40 mg via ORAL
  Filled 2012-02-15 (×2): qty 1

## 2012-02-15 MED ORDER — INSULIN ASPART 100 UNIT/ML ~~LOC~~ SOLN: 0.0000 [IU] | Freq: Every day | SUBCUTANEOUS | Status: DC

## 2012-02-15 MED ORDER — HEPARIN SODIUM (PORCINE) 5000 UNIT/ML IJ SOLN
5000.0000 [IU] | Freq: Three times a day (TID) | INTRAMUSCULAR | Status: DC
  Administered 2012-02-15 – 2012-02-17 (×6): 5000 [IU] via SUBCUTANEOUS
  Filled 2012-02-15 (×6): qty 1

## 2012-02-15 MED ORDER — FLUTICASONE PROPIONATE 50 MCG/ACT NA SUSP
1.0000 | Freq: Every day | NASAL | Status: DC
  Administered 2012-02-15 – 2012-02-16 (×2): 1 via NASAL
  Filled 2012-02-15: qty 16

## 2012-02-15 MED ORDER — METFORMIN HCL 500 MG PO TABS
1000.0000 mg | ORAL_TABLET | Freq: Two times a day (BID) | ORAL | Status: DC
  Administered 2012-02-16 – 2012-02-17 (×3): 1000 mg via ORAL
  Filled 2012-02-15 (×3): qty 2

## 2012-02-15 MED ORDER — THIOTHIXENE 2 MG PO CAPS
2.0000 mg | ORAL_CAPSULE | Freq: Every day | ORAL | Status: DC
  Administered 2012-02-15 – 2012-02-16 (×2): 2 mg via ORAL
  Filled 2012-02-15 (×4): qty 1

## 2012-02-15 MED ORDER — PROMETHAZINE HCL 12.5 MG PO TABS: 12.5000 mg | ORAL_TABLET | Freq: Four times a day (QID) | ORAL | Status: DC | PRN

## 2012-02-15 MED ORDER — HYDROCHLOROTHIAZIDE 12.5 MG PO CAPS
12.5000 mg | ORAL_CAPSULE | Freq: Every day | ORAL | Status: DC
  Administered 2012-02-15 – 2012-02-16 (×2): 12.5 mg via ORAL
  Filled 2012-02-15 (×2): qty 1

## 2012-02-15 MED ORDER — SODIUM CHLORIDE 0.9 % IJ SOLN
3.0000 mL | Freq: Two times a day (BID) | INTRAMUSCULAR | Status: DC
  Administered 2012-02-15 – 2012-02-16 (×3): 3 mL via INTRAVENOUS

## 2012-02-15 MED ORDER — LAMOTRIGINE 100 MG PO TABS
100.0000 mg | ORAL_TABLET | ORAL | Status: DC
  Administered 2012-02-16 – 2012-02-17 (×2): 100 mg via ORAL
  Filled 2012-02-15 (×4): qty 1

## 2012-02-15 NOTE — ED Provider Notes (Signed)
History  This chart was scribed for [name] Co, MD by Ardeen Jourdain, ED Scribe. This patient was seen in room [facility]18/[facility]18 and the patient's care was started at 0940.  CSN: [id number]  Arrival date & time 02/15/12  4098  Level 5 Caveat: Altered mental status   First MD Initiated Contact with Patient 02/15/12 0940      Chief Complaint  Patient presents with  . Loss of Consciousness     The history is provided by a caregiver and the EMS personnel. The history is limited by the condition of the patient. No language interpreter was used.    [name] is a 61 y.o. female brought in by ambulance, who presents to the Emergency Department complaining of a fall with associated LOC and lethargy. She was found this morning by her nurse at home on the floor. Her aid states she is not sure if the pt is on hospice. She states the pt has had a productive cough with bloody sputum and congestion for the past week. Her nurse has noticed a lowered appetite in the pt. Pt has a h/o bipolar disorder, CVA and DM. Pt is a current everyday smoker but denies alcohol use.   Past Medical History  Diagnosis Date  . Bipolar disorder   . CVA (cerebral infarction)   . Pancreatitis 2006    due to Depakote with normal EUS   . Osteoporosis   . Chronic back pain   . Diabetes mellitus   . Trigger finger   . Anxiety disorder   . Hypertension   . Migraines   . Diverticulosis     TCS 9/08 by Dr. Lina Sar for diarrhea . Bx for micro scopic colitis negative.   . Schatzki's ring     non critical / EGD with ED 8/2011with RMR  . S/P colonoscopy 1191,4782, 2011    left-sided diverticula, hx of simple adenomas . 2011, random bx negative for microscopic colitis  . Glaucoma(365)   . Allergic rhinitis   . Hypothyroidism     thyroid goiter  . Anemia   . Blood transfusion   . GERD (gastroesophageal reflux disease)   . Stroke     left sided weakness  . Seizures     unknown etiology-on meds-last seizure was  3 yerars ago  . Anxiety   . Depression   . Metabolic encephalopathy 08/03/2011  . Sleep apnea   . Arthritis   . Gum symptoms     infection on antibiotic    Past Surgical History  Procedure Date  . Abdominal hysterectomy   . Cholecystectomy   . Ovarian cyst removal   . Carpal tunnel release 07/22/04    left/ Dr. Romeo Apple   . Breast reduction surgery   . Bilateral cataract surgery   . Biopsy of thyroid gland 2009  . Surgical excision of 3 tumors from right thigh and right buttock  and left upper thigh 2010  . Back surgery July 2012  . Spine surgery 09/29/2010    dr brooks  . Maloney dilation 12/29/2010    Procedure: Elease Hashimoto DILATION;  Surgeon: Corbin Ade, MD;  Location: [facility];  Service: Endoscopy;  Laterality: N/A;  56, 58,  . Esophagogastroduodenoscopy 12/29/2010    Rourk-Retained food in the esophagus and stomach, small hiatal hernia, status post Maloney dilation of the esophagus  . Craniotomy 11/23/2011    Procedure: CRANIOTOMY TUMOR EXCISION;  Surgeon: Hewitt Shorts, MD;  Location: [facility];  Service: Neurosurgery;  Laterality: N/A;  Craniotomy for tumor resection  . Brain surgery 11/2011    resection of meningioma    Family History  Problem Relation Age of Onset  . Heart attack Mother   . Pneumonia Father   . Kidney failure Father   . Cancer Sister     pancreatic  . Diabetes Brother   . Hypertension Brother   . Colon cancer Neg Hx   . Anesthesia problems Neg Hx   . Hypotension Neg Hx   . Malignant hyperthermia Neg Hx   . Pseudochol deficiency Neg Hx     History  Substance Use Topics  . Smoking status: Current Every Day Smoker -- 0.2 packs/day for 7 years    Types: Cigarettes  . Smokeless tobacco: Not on file     Comment: started at 24 smoked 6 yrs and stopped. restarted 2 months ago  . Alcohol Use: No     Comment: 5 cigs/day    No OB history available.   Review of Systems  Unable to perform ROS: Mental status change    Allergies   Cephalexin; Milk-related compounds; Penicillins; and Phenazopyridine hcl  Home Medications   Current Outpatient Rx  Name  Route  Sig  Dispense  Refill  . ACETAMINOPHEN 325 MG PO TABS   Oral   Take 1-2 tablets (325-650 mg total) by mouth every 4 (four) hours as needed for pain.   1 tablet   0   . BENZONATATE 100 MG PO CAPS   Oral   Take 1 capsule (100 mg total) by mouth every 6 (six) hours as needed for cough.   30 capsule   0   . CLONIDINE HCL 0.3 MG PO TABS      TAKE 1 TABLET BY MOUTH 3 TIMES DAILY: TAKE AT 8 A.M.;4 P.M.; AND MIDNIGHT.   90 tablet   3   . CLOPIDOGREL BISULFATE 75 MG PO TABS   Oral   Take 75 mg by mouth daily.         [name] CLOTRIMAZOLE-BETAMETHASONE 1-0.05 % EX CREA   Topical   Apply topically 2 (two) times daily. Apply twice daily to affected areas for 2 weeks, then only as needed   45 g   0   . CYCLOBENZAPRINE HCL 10 MG PO TABS   Oral   Take 10 mg by mouth daily as needed. spasm         . CYCLOSPORINE 0.05 % OP EMUL      1 drop 2 (two) times daily.         [name] DICLOFENAC SODIUM 1 % TD GEL      Apply twice daily, as needed to leftleg for pain   100 g   1   . DIPHENOXYLATE-ATROPINE 2.5-0.025 MG PO TABS   Oral   Take 1 tablet by mouth 4 (four) times daily as needed. For loose stools         . ERGOCALCIFEROL 50000 UNITS PO CAPS   Oral   Take 1 capsule (50,000 Units total) by mouth once a week. Patient takes on Sundays   4 capsule   2   . FLUTICASONE PROPIONATE 50 MCG/ACT NA SUSP   Nasal   Place 1 spray into the nose daily. Allergies   16 g   3   . GLIPIZIDE ER 5 MG PO TB24   Oral   Take 1 tablet (5 mg total) by mouth daily.   30 tablet   5  Discontinue glipizide 10mg  and onglyza effective 1 ...   . HYDROCHLOROTHIAZIDE 12.5 MG PO CAPS      TAKE ONE CAPSULE DAILY.   30 capsule   3   . LAMOTRIGINE 100 MG PO TABS   Oral   Take 1 tablet (100 mg total) by mouth every morning.   30 tablet   2   . LEVOTHYROXINE SODIUM  50 MCG PO TABS   Oral   Take 50 mcg by mouth every morning.          [name] LORAZEPAM 1 MG PO TABS   Oral   Take 0.5 tablets (0.5 mg total) by mouth 3 (three) times daily.   30 tablet      . MECLIZINE HCL 12.5 MG PO TABS   Oral   Take 12.5 mg by mouth 3 (three) times daily as needed. dizziness         . METFORMIN HCL 1000 MG PO TABS   Oral   Take 1 tablet (1,000 mg total) by mouth 2 (two) times daily with a meal.   60 tablet   11     Please note  That patient has changed from liquid  ...   . METOPROLOL TARTRATE 50 MG PO TABS   Oral   Take 50 mg by mouth 2 (two) times daily.         [name] MIRTAZAPINE 30 MG PO TABS   Oral   Take 30 mg by mouth at bedtime.          . MORPHINE SULFATE 15 MG PO TABS   Oral   Take 15 mg by mouth 2 (two) times daily as needed. For pain         . NYSTATIN 100000 UNIT/GM EX POWD      APPLY TO AFFECTED AREAS TWICE DAILY.   60 g   2   . PREGABALIN 75 MG PO CAPS   Oral   Take 1 capsule (75 mg total) by mouth 2 (two) times daily.   60 capsule   2   . PROMETHAZINE HCL 12.5 MG PO TABS   Oral   Take 12.5 mg by mouth every 6 (six) hours as needed. For nausea         . RABEPRAZOLE SODIUM 20 MG PO TBEC   Oral   Take 1 tablet (20 mg total) by mouth daily.   31 tablet   11   . ROSUVASTATIN CALCIUM 10 MG PO TABS   Oral   Take 10 mg by mouth daily.         . SERTRALINE HCL 100 MG PO TABS   Oral   Take 150 mg by mouth at bedtime.          [name] SINGULAIR 10 MG PO TABS      TAKE ONE TABLET DAILY.   30 each   3   . SULFAMETHOXAZOLE-TRIMETHOPRIM 800-160 MG PO TABS   Oral   Take 1 tablet by mouth 2 (two) times daily.   120 tablet   0   . THIOTHIXENE 2 MG PO CAPS   Oral   Take 2 mg by mouth at bedtime.          [name] TIZANIDINE HCL 4 MG PO TABS   Oral   Take 4 mg by mouth 3 (three) times daily as needed. For spasms         . TRAZODONE HCL 50 MG PO TABS   Oral   Take 50 mg  by mouth at bedtime.         [name] ZOFRAN 4 MG PO  TABS      TAKE 1 TABLET BY MOUTH EVERY 8 HOURS AS NEEDED FOR NAUSEA.   20 each   0     Triage Vitals: BP 99/51  Pulse 75  Temp 98.1 F (36.7 C)  Resp 20  SpO2 94%  Physical Exam  Nursing note and vitals reviewed. Constitutional: She is oriented to person, place, and time. She appears well-developed and well-nourished. No distress.  HENT:  Head: Normocephalic and atraumatic.       Dry mucus membranes, upper and lower dentures  Eyes: EOM are normal. Pupils are equal, round, and reactive to light.       Pupils 3 mm  Neck: Normal range of motion.  Cardiovascular: Normal rate, regular rhythm and normal heart sounds.   Pulmonary/Chest: Effort normal and breath sounds normal.  Abdominal: Soft. Bowel sounds are normal. She exhibits no distension. There is no tenderness.       No peritonitis, no obvious tenderness   Musculoskeletal: Normal range of motion.  Neurological: She is alert and oriented to person, place, and time.       Follows simple commands, awakens to voice, does not speak, left sided facial droop, grips with hands and wiggles toes,  Skin: Skin is warm and dry.  Psychiatric: She has a normal mood and affect. Judgment normal.    ED Course  Procedures (including critical care time)  DIAGNOSTIC STUDIES: Oxygen Saturation is 94% on room air, normal by my interpretation.    COORDINATION OF CARE:  9:46 AM: Discussed treatment plan which includes blood work, EKG, glucose test and IV fluids with pt at bedside and pt agreed to plan.   Date: 02/15/2012  Rate: 73Rhythm: normal sinus rhythm  QRS Axis: normal  Intervals: normal  ST/T Wave abnormalities: normal  Conduction Disutrbances: none  Narrative Interpretation:   Old EKG Reviewed: No significant changes noted     CRITICAL CARE Performed by: [name] Total critical care time: 30 Critical care time was exclusive of separately billable procedures and treating other patients. Critical care was necessary to  treat or prevent imminent or life-threatening deterioration. Critical care was time spent personally by me on the following activities: development of treatment plan with patient and/or surrogate as well as nursing, discussions with consultants, evaluation of patient's response to treatment, examination of patient, obtaining history from patient or surrogate, ordering and performing treatments and interventions, ordering and review of laboratory studies, ordering and review of radiographic studies, pulse oximetry and re-evaluation of patient's condition.   Labs Reviewed  CBC WITH DIFFERENTIAL - Abnormal; Notable for the following:    Hemoglobin 9.9 (*)     HCT 33.0 (*)     MCV 73.0 (*)     MCH 21.9 (*)     All other components within normal limits  COMPREHENSIVE METABOLIC PANEL - Abnormal; Notable for the following:    BUN 30 (*)     Creatinine, Ser 1.49 (*)     Albumin 3.1 (*)     Total Bilirubin 0.1 (*)     GFR calc non Af Amer 37 (*)     GFR calc Af Amer 43 (*)     All other components within normal limits  URINALYSIS, ROUTINE W REFLEX MICROSCOPIC - Abnormal; Notable for the following:    Specific Gravity, Urine >1.030 (*)     All other components within normal limits  BLOOD GAS, ARTERIAL - Abnormal; Notable for the following:    pH, Arterial 7.308 (*)     pCO2 arterial 51.9 (*)     pO2, Arterial 67.5 (*)     Bicarbonate 25.3 (*)     All other components within normal limits  GLUCOSE, CAPILLARY  AMMONIA  LACTIC ACID, PLASMA  TROPONIN I  CULTURE, BLOOD (ROUTINE X 2)  CULTURE, BLOOD (ROUTINE X 2)  CK   Dg Chest 1 View  02/15/2012  *RADIOLOGY REPORT*  Clinical Data: Loss of consciousness  CHEST - 1 VIEW  Comparison: Some 11/23/2011  Findings: Cardiomediastinal silhouette is stable.  The study is limited by poor inspiration.  Bilateral basilar atelectasis. Central mild vascular congestion without pulmonary edema.  IMPRESSION: Limited study by poor inspiration.  Mild basilar  atelectasis.  No convincing pulmonary edema.   Original Report Authenticated By: Natasha Mead, M.D.    Ct Head Wo Contrast  02/15/2012  *RADIOLOGY REPORT*  Clinical Data: The patient found on floor unconscious, history of frontal meningioma resection in September 2013  CT HEAD WITHOUT CONTRAST  Technique:  Contiguous axial images were obtained from the base of the skull through the vertex without contrast.  Comparison: Prior head CT 01/09/2012  Findings: Calvarium is intact except for frontal craniotomy.  There is no evidence of hemorrhage, infarct, or mass.  Clips in the anterior midline are again identified postoperatively.  There is multi focal paranasal sinus chronic appearing inflammatory change, which appears increased when compared to the prior study.  IMPRESSION: Stable postoperative change.  No acute findings.   Original Report Authenticated By: Esperanza Heir, M.D.    Ct Cervical Spine Wo Contrast  02/15/2012  *RADIOLOGY REPORT*  Clinical Data: Loss of consciousness.  The patient was found on the floor.  CT CERVICAL SPINE WITHOUT CONTRAST  Technique:  Multidetector CT imaging of the cervical spine was performed. Multiplanar CT image reconstructions were also generated.  Comparison: CT scan dated 06/10/2006  Findings: There is no acute fracture, subluxation, or prevertebral soft tissue swelling.  Slight degenerative disc disease at C5-6. Old deformity of the anterior superior endplate of T2.  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  IMPRESSION: No acute abnormality of the cervical spine.   Original Report Authenticated By: Francene Boyers, M.D.    I personally reviewed the imaging tests through PACS system I reviewed available ER/hospitalization records through the EMR   1. Altered mental status   2. Hypercarbia   3. Renal failure       MDM  The patient presented with altered mental status.  The patient's CT head and C-spine are normal.  She does have evidence of acute renal  failure with a doubling of her creatinine in the past 2 months.  Her urinalysis shows no signs of infection.  Total CKs pending at this time.  She does appear to be slightly acidotic and hypercarbic as compared to her baseline.  She we trialed on a short course of BiPAP to help with ventilation.  She is arousable to voice.  She otherwise is very drowsy.  She is on chronic pain medications and at this time she has not had pinpoint pupils and she still is breathing.  I will hold Narcan as I think this may complicate her situation.  The patient be admitted the hospital.  Another alternative diagnosis would be postictal state from seizures.  She is on call for seizures.  No seizure activity noted.  If this is a postictal phases  of the prolonged and she continues to be altered.      I personally performed the services described in this documentation, which was scribed in my presence. The recorded information has been reviewed and is accurate.     [name] Co, MD 02/15/12 [phone]

## 2012-02-15 NOTE — Progress Notes (Signed)
UR COMPLETED  

## 2012-02-15 NOTE — H&P (Signed)
[facility] History and Physical  [name] UEA:[id number] DOB: 04/02/1950 DOA: 02/15/2012  Referring physician: Dr. Patria Mane, ER physician. PCP: Syliva Overman, MD    Chief Complaint: Altered mental status.  HPI: [name] is a 61 y.o. female presents with the above symptoms. She was found this morning at her home by the visiting home nurse to be on the floor. Patient was lethargic in the emergency room and could not give much of a history. Blood glucose was not decreased. The patient is on several medications that could potentially make her drowsy. She apparently dispenses her medications to her self. She recently was hospitalized in September under the care of neurosurgery whereupon she had a bicoronal craniotomy and total resection of a subfrontal meningioma. This meningioma was associated with vasogenic cerebral edema. She then spent 3 weeks in a nursing home and has been home since that time. She is on several medications such as benzodiazepines and opioids which could have made her drowsy. I suspect she probably took too many of one of the medications.   Review of Systems: Apart from history of present illness, other systems negative.  Past Medical History  Diagnosis Date  . Bipolar disorder   . CVA (cerebral infarction)   . Pancreatitis 2006    due to Depakote with normal EUS   . Osteoporosis   . Chronic back pain   . Diabetes mellitus   . Trigger finger   . Anxiety disorder   . Hypertension   . Migraines   . Diverticulosis     TCS 9/08 by Dr. Lina Sar for diarrhea . Bx for micro scopic colitis negative.   . Schatzki's ring     non critical / EGD with ED 8/2011with RMR  . S/P colonoscopy [id number], 2011    left-sided diverticula, hx of simple adenomas . 2011, random bx negative for microscopic colitis  . Glaucoma(365)   . Allergic rhinitis   . Hypothyroidism     thyroid goiter  . Anemia   . Blood transfusion   . GERD (gastroesophageal reflux disease)    . Stroke     left sided weakness  . Seizures     unknown etiology-on meds-last seizure was 3 yerars ago  . Anxiety   . Depression   . Metabolic encephalopathy 08/03/2011  . Sleep apnea   . Arthritis   . Gum symptoms     infection on antibiotic   Past Surgical History  Procedure Date  . Abdominal hysterectomy   . Cholecystectomy   . Ovarian cyst removal   . Carpal tunnel release 07/22/04    left/ Dr. Romeo Apple   . Breast reduction surgery   . Bilateral cataract surgery   . Biopsy of thyroid gland 2009  . Surgical excision of 3 tumors from right thigh and right buttock  and left upper thigh 2010  . Back surgery July 2012  . Spine surgery 09/29/2010    dr brooks  . Maloney dilation 12/29/2010    Procedure: Elease Hashimoto DILATION;  Surgeon: Corbin Ade, MD;  Location: [facility];  Service: Endoscopy;  Laterality: N/A;  56, 58,  . Esophagogastroduodenoscopy 12/29/2010    Rourk-Retained food in the esophagus and stomach, small hiatal hernia, status post Maloney dilation of the esophagus  . Craniotomy 11/23/2011    Procedure: CRANIOTOMY TUMOR EXCISION;  Surgeon: Hewitt Shorts, MD;  Location: [facility];  Service: Neurosurgery;  Laterality: N/A;  Craniotomy for tumor resection  . Brain surgery 11/2011  resection of meningioma   Social History:  reports that she has been smoking Cigarettes.  She has a 1.75 pack-year smoking history. She has never used smokeless tobacco. She reports that she does not drink alcohol or use illicit drugs.  Allergies  Allergen Reactions  . Cephalexin Hives  . Milk-Related Compounds Other (See Comments)    Doesn't agree with stomach.   . Penicillins Hives  . Phenazopyridine Hcl Other (See Comments)    Whelps      Family History  Problem Relation Age of Onset  . Heart attack Mother   . Pneumonia Father   . Kidney failure Father   . Cancer Sister     pancreatic  . Diabetes Brother   . Hypertension Brother   . Colon cancer Neg Hx   . Anesthesia  problems Neg Hx   . Hypotension Neg Hx   . Malignant hyperthermia Neg Hx   . Pseudochol deficiency Neg Hx       Prior to Admission medications   Medication Sig Start Date End Date Taking? Authorizing Provider  benzonatate (TESSALON PERLES) 100 MG capsule Take 1 capsule (100 mg total) by mouth every 6 (six) hours as needed for cough. 02/14/12 02/13/13 Yes Kerri Perches, MD  cloNIDine (CATAPRES) 0.3 MG tablet Take 0.3 mg by mouth 3 (three) times daily. Patient takes at 8am,4pm,and midnight   Yes Historical Provider, MD  clopidogrel (PLAVIX) 75 MG tablet Take 75 mg by mouth daily.   Yes Historical Provider, MD  clotrimazole-betamethasone (LOTRISONE) cream Apply topically 2 (two) times daily. Apply twice daily to affected areas for 2 weeks, then only as needed 02/14/12 04/16/12 Yes Kerri Perches, MD  cyclobenzaprine (FLEXERIL) 10 MG tablet Take 10 mg by mouth daily as needed. spasm   Yes Historical Provider, MD  cycloSPORINE (RESTASIS) 0.05 % ophthalmic emulsion 1 drop 2 (two) times daily.   Yes Historical Provider, MD  diclofenac sodium (VOLTAREN) 1 % GEL Apply twice daily, as needed to leftleg for pain 12/28/11 12/27/12 Yes Kerri Perches, MD  diphenoxylate-atropine (LOMOTIL) 2.5-0.025 MG per tablet Take 1 tablet by mouth 4 (four) times daily as needed. For loose stools 07/17/11 07/16/12 Yes Tiffany Kocher, PA  fluticasone (FLONASE) 50 MCG/ACT nasal spray Place 1 spray into the nose daily. Allergies 06/14/11  Yes Kerri Perches, MD  hydrochlorothiazide (MICROZIDE) 12.5 MG capsule Take 12.5 mg by mouth daily.   Yes Historical Provider, MD  lamoTRIgine (LAMICTAL) 100 MG tablet Take 1 tablet (100 mg total) by mouth every morning. 11/09/11  Yes Kerri Perches, MD  levothyroxine (LEVOXYL) 50 MCG tablet Take 50 mcg by mouth every morning.    Yes Historical Provider, MD  LORazepam (ATIVAN) 1 MG tablet Take 0.5 mg by mouth 4 (four) times daily. 04/28/11  Yes Christiane Ha, MD  metFORMIN  (GLUCOPHAGE) 1000 MG tablet Take 1 tablet (1,000 mg total) by mouth 2 (two) times daily with a meal. 03/17/11 03/16/12 Yes Kerri Perches, MD  metoprolol (LOPRESSOR) 50 MG tablet Take 50 mg by mouth 2 (two) times daily. 04/28/11  Yes Christiane Ha, MD  mirtazapine (REMERON) 30 MG tablet Take 30 mg by mouth at bedtime.    Yes Historical Provider, MD  montelukast (SINGULAIR) 10 MG tablet Take 10 mg by mouth at bedtime.   Yes Historical Provider, MD  morphine (MSIR) 15 MG tablet Take 15 mg by mouth 2 (two) times daily as needed. For pain   Yes Historical Provider, MD  nystatin (MYCOSTATIN) powder Apply topically 2 (two) times daily.   Yes Historical Provider, MD  ondansetron (ZOFRAN) 4 MG tablet Take 4 mg by mouth every 8 (eight) hours as needed. nausea   Yes Historical Provider, MD  ONGLYZA 5 MG TABS tablet Take 5 mg by mouth Daily. 01/22/12  Yes Historical Provider, MD  pregabalin (LYRICA) 75 MG capsule Take 1 capsule (75 mg total) by mouth 2 (two) times daily. 10/26/11  Yes Vickki Hearing, MD  RABEprazole (ACIPHEX) 20 MG tablet Take 1 tablet (20 mg total) by mouth daily. 06/19/11  Yes Tiffany Kocher, PA  rosuvastatin (CRESTOR) 10 MG tablet Take 10 mg by mouth daily.   Yes Historical Provider, MD  sertraline (ZOLOFT) 100 MG tablet Take 150 mg by mouth at bedtime.    Yes Historical Provider, MD  sulfamethoxazole-trimethoprim (BACTRIM DS,SEPTRA DS) 800-160 MG per tablet Take 1 tablet by mouth 2 (two) times daily. For 10 days 02/14/12 02/24/12 Yes Kerri Perches, MD  thiothixene (NAVANE) 2 MG capsule Take 2 mg by mouth at bedtime.  07/28/11  Yes Historical Provider, MD  tiZANidine (ZANAFLEX) 4 MG tablet Take 4 mg by mouth 3 (three) times daily as needed. For spasms 07/13/11  Yes Historical Provider, MD  traZODone (DESYREL) 50 MG tablet Take 50 mg by mouth at bedtime.   Yes Historical Provider, MD  triamcinolone cream (KENALOG) 0.1 % Apply 0.1 % topically Twice daily as needed. itching 01/31/12  Yes  Historical Provider, MD  acetaminophen (TYLENOL) 325 MG tablet Take 1-2 tablets (325-650 mg total) by mouth every 4 (four) hours as needed for pain. 12/01/11   Hewitt Shorts, MD  ergocalciferol (VITAMIN D2) 50000 UNITS capsule Take 1 capsule (50,000 Units total) by mouth once a week. Patient takes on Sundays 11/08/11   Kerri Perches, MD  meclizine (ANTIVERT) 12.5 MG tablet Take 12.5 mg by mouth 3 (three) times daily as needed. dizziness    Historical Provider, MD  promethazine (PHENERGAN) 12.5 MG tablet Take 12.5 mg by mouth every 6 (six) hours as needed. For nausea    Historical Provider, MD   Physical Exam: Filed Vitals:   02/15/12 1100 02/15/12 1200 02/15/12 1220 02/15/12 1230  BP: 116/68 132/67  135/60  Pulse: 75 75 73 72  Temp:      TempSrc:      Resp: 14 13 11 12   SpO2: 97% 98% 100% 100%     General:  She is drowsy. She does open eyes to her name.  Eyes: No pallor. No jaundice.  ENT: Not examined as she is very drowsy.  Neck: No major abnormalities.  Cardiovascular: Heart sounds are present and in sinus rhythm.  Respiratory: Lung fields are clear.  Abdomen: Soft, nontender without evidence of hepatosplenomegaly.  Skin: No rashes.  Musculoskeletal: No acute fractures.  Psychiatric: Not examined.  Neurologic: Drowsy. There does not appear to be any focal neurological signs as she appears to move all limbs, albeit slowly.  Labs on Admission:  Basic Metabolic Panel:  Lab 02/15/12 0454  NA 142  K 3.8  CL 105  CO2 26  GLUCOSE 74  BUN 30*  CREATININE 1.49*  CALCIUM 8.9  MG --  PHOS --   Liver Function Tests:  Lab 02/15/12 1032  AST 29  ALT 24  ALKPHOS 85  BILITOT 0.1*  PROT 6.8  ALBUMIN 3.1*     Lab 02/15/12 1033  AMMONIA 32   CBC:  Lab 02/15/12 1032  WBC 7.5  NEUTROABS 4.9  HGB 9.9*  HCT 33.0*  MCV 73.0*  PLT 232   Cardiac Enzymes:  Lab 02/15/12 1032  CKTOTAL 692*  CKMB --  CKMBINDEX --  TROPONINI <0.30      CBG:  Lab  02/15/12 0926  GLUCAP 72    Radiological Exams on Admission: Dg Chest 1 View  02/15/2012  *RADIOLOGY REPORT*  Clinical Data: Loss of consciousness  CHEST - 1 VIEW  Comparison: Some 11/23/2011  Findings: Cardiomediastinal silhouette is stable.  The study is limited by poor inspiration.  Bilateral basilar atelectasis. Central mild vascular congestion without pulmonary edema.  IMPRESSION: Limited study by poor inspiration.  Mild basilar atelectasis.  No convincing pulmonary edema.   Original Report Authenticated By: Natasha Mead, M.D.    Ct Head Wo Contrast  02/15/2012  *RADIOLOGY REPORT*  Clinical Data: The patient found on floor unconscious, history of frontal meningioma resection in September 2013  CT HEAD WITHOUT CONTRAST  Technique:  Contiguous axial images were obtained from the base of the skull through the vertex without contrast.  Comparison: Prior head CT 01/09/2012  Findings: Calvarium is intact except for frontal craniotomy.  There is no evidence of hemorrhage, infarct, or mass.  Clips in the anterior midline are again identified postoperatively.  There is multi focal paranasal sinus chronic appearing inflammatory change, which appears increased when compared to the prior study.  IMPRESSION: Stable postoperative change.  No acute findings.   Original Report Authenticated By: Esperanza Heir, M.D.    Ct Cervical Spine Wo Contrast  02/15/2012  *RADIOLOGY REPORT*  Clinical Data: Loss of consciousness.  The patient was found on the floor.  CT CERVICAL SPINE WITHOUT CONTRAST  Technique:  Multidetector CT imaging of the cervical spine was performed. Multiplanar CT image reconstructions were also generated.  Comparison: CT scan dated 06/10/2006  Findings: There is no acute fracture, subluxation, or prevertebral soft tissue swelling.  Slight degenerative disc disease at C5-6. Old deformity of the anterior superior endplate of T2.  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  IMPRESSION: No acute  abnormality of the cervical spine.   Original Report Authenticated By: Francene Boyers, M.D.     EKG: Independently reviewed. Normal sinus rhythm, no acute ST-T wave changes.  Assessment/Plan Active Problems:  Altered mental status  DIABETES MELLITUS  OBESITY  HYPERTENSION  Bipolar disorder  Meningioma  Renal failure  Hypercarbia   1. Altered mental status, likely related to polypharmacy. 2. Hypertension. 3. Type 2 diabetes mellitus. 4. Dehydration and acute renal failure. 5. Hypercarbia. 6. Bipolar disorder. 7. Obesity.  Plan: 1. Admit to step down unit. 2. Monitor respiratory status closely. Currently she is not requiring BiPAP. 3. Intravenous fluids for hydration. 4. Discontinue several medications which would cause drowsiness.  Further recommendations will depend on patient's hospital progress.  Code Status: Full code currently.  Family Communication: Discussed plan with patient's son.   Disposition Plan: Back to her rest home when medically stable.   Time spent: 30 minutes.  Wilson Singer [facility] Pager [phone].  If 7PM-7AM, please contact night-coverage [email or web address] Password [email or web address] 02/15/2012, 1:55 PM

## 2012-02-15 NOTE — Progress Notes (Signed)
At 1600 pt blood sugar 50. Pt sitting up and talking with family members. Cranberry juice given x2. Blood sugar rechecked 15 minutes later and 59.   Pt given Coke-cola, and blood sugar rechecked 15 minutes later and 98. Pts dinner tray at bedside. Will continue to monitor.

## 2012-02-15 NOTE — Progress Notes (Signed)
Pt had medications that were brought up from the ER. Medications were counted with Dagoberto Ligas, RN and taken to pharmacy.

## 2012-02-15 NOTE — ED Notes (Signed)
Pt nurse walked in this am and found pt in floor. Pt states she was in chair prior to nurse arrival and just fell in floor. Pt lethargic. In and out of sleep. Responds to touch and name. cbg 108 in route. limp

## 2012-02-16 DIAGNOSIS — G5600 Carpal tunnel syndrome, unspecified upper limb: Secondary | ICD-10-CM

## 2012-02-16 LAB — CBC
HCT: 30.2 % — ABNORMAL LOW (ref 36.0–46.0)
Hemoglobin: 9 g/dL — ABNORMAL LOW (ref 12.0–15.0)
MCHC: 29.8 g/dL — ABNORMAL LOW (ref 30.0–36.0)
RBC: 4.14 MIL/uL (ref 3.87–5.11)

## 2012-02-16 LAB — COMPREHENSIVE METABOLIC PANEL
ALT: 24 U/L (ref 0–35)
Calcium: 8.5 mg/dL (ref 8.4–10.5)
Creatinine, Ser: 0.73 mg/dL (ref 0.50–1.10)
GFR calc Af Amer: >90 mL/min (ref >90)
Glucose, Bld: 162 mg/dL — ABNORMAL HIGH (ref 70–99)
Sodium: 140 mEq/L (ref 135–145)
Total Protein: 6.8 g/dL (ref 6.0–8.3)

## 2012-02-16 LAB — GLUCOSE, CAPILLARY
Glucose-Capillary: 153 mg/dL — ABNORMAL HIGH (ref 70–99)
Glucose-Capillary: 151 mg/dL — ABNORMAL HIGH (ref 70–99)
Glucose-Capillary: 123 mg/dL — ABNORMAL HIGH (ref 70–99)

## 2012-02-16 MED ORDER — MUPIROCIN 2 % EX OINT
1.0000 "application " | TOPICAL_OINTMENT | Freq: Two times a day (BID) | CUTANEOUS | Status: DC
  Administered 2012-02-16 (×2): 1 via NASAL
  Filled 2012-02-16: qty 22

## 2012-02-16 MED ORDER — CHLORHEXIDINE GLUCONATE CLOTH 2 % EX PADS
6.0000 | MEDICATED_PAD | Freq: Every day | CUTANEOUS | Status: DC
  Administered 2012-02-17: 6 via TOPICAL

## 2012-02-16 NOTE — Progress Notes (Signed)
Inpatient Diabetes Program Recommendations  AACE/ADA: New Consensus Statement on Inpatient Glycemic Control (2013)  Target Ranges:  Prepandial:   less than 140 mg/dL      Peak postprandial:   less than 180 mg/dL (1-2 hours)      Critically ill patients:  140 - 180 mg/dL   Results for TYIANNA, MENEFEE (MRN [id number]) as of 02/16/2012 08:04  Ref. Range 02/15/2012 09:26 02/15/2012 15:57 02/15/2012 16:25 02/15/2012 16:50 02/15/2012 21:03 02/16/2012 07:18  Glucose-Capillary Latest Range: 70-99 mg/dL 72 50 (L) 59 (L) 98 829 (H) 153 (H)    Inpatient Diabetes Program Recommendations Correction (SSI): Please consider decreasing Novolog correction scale to sensitive since patient was hypoglycemic yesterday around 4pm.   Note: Patient has Metformin 1000mg  BID, Tradjenta 5mg  daily, and Novolog resistant correction scale ACHS ordered for glycemic control while inpatient.  Patient did not receive Metformin yesterday evening nor the ordered Tradjenta yesterday.  However, patient had a hypoglycemic event around 4pm on 02/15/2012.  Please consider decreasing correction scale to sensitive scale since patient will be taking oral diabetic medications; she has already received Metformin 1000mg  PO this am.  Will continue to follow.  Thanks, Orlando Penner, RN, BSN, CCRN Diabetes Coordinator Inpatient Diabetes Program [phone] - phone [phone] - Department pager

## 2012-02-16 NOTE — Progress Notes (Signed)
PT TRANSFERRED TO ICU ROOM 06 WHICH IS AN UNMONITORED ROOM. PT IS MED/SURG STATUS AND NO MED/SURG BEDS AVAILABLE ON 300.

## 2012-02-16 NOTE — Progress Notes (Signed)
Subjective: This lady is now awake alert and orientated. Serial cardiac enzymes were negative. She is tolerating a diet.           Physical Exam: Blood pressure 116/60, pulse 68, temperature 97.9 F (36.6 C), temperature source Oral, resp. rate 20, height 5' (1.524 m), weight 69.8 kg (153 lb 14.1 oz), SpO2 100.00%. She does look systemically well. She is alert and orientated. There are no new focal neurological signs. Heart sounds are present and normal without gallop rhythm. There is no evidence of heart failure. Lung fields are clear.   Investigations:  Recent Results (from the past 240 hour(s))  MRSA PCR SCREENING     Status: Abnormal   Collection Time   02/15/12  3:54 PM      Component Value Range Status Comment   MRSA by PCR POSITIVE (*) NEGATIVE Final      Basic Metabolic Panel:  Basename 02/16/12 0431 02/15/12 1032  NA 140 142  K 4.0 3.8  CL 107 105  CO2 25 26  GLUCOSE 162* 74  BUN 16 30*  CREATININE 0.73 1.49*  CALCIUM 8.5 8.9  MG -- --  PHOS -- --   Liver Function Tests:  [facility] 02/16/12 0431 02/15/12 1032  AST 30 29  ALT 24 24  ALKPHOS 99 85  BILITOT 0.2* 0.1*  PROT 6.8 6.8  ALBUMIN 3.0* 3.1*     CBC:  Basename 02/16/12 0431 02/15/12 1032  WBC 5.7 7.5  NEUTROABS -- 4.9  HGB 9.0* 9.9*  HCT 30.2* 33.0*  MCV 72.9* 73.0*  PLT 213 232    Dg Chest 1 View  02/15/2012  *RADIOLOGY REPORT*  Clinical Data: Loss of consciousness  CHEST - 1 VIEW  Comparison: Some 11/23/2011  Findings: Cardiomediastinal silhouette is stable.  The study is limited by poor inspiration.  Bilateral basilar atelectasis. Central mild vascular congestion without pulmonary edema.  IMPRESSION: Limited study by poor inspiration.  Mild basilar atelectasis.  No convincing pulmonary edema.   Original Report Authenticated By: Natasha Mead, M.D.    Ct Head Wo Contrast  02/15/2012  *RADIOLOGY REPORT*  Clinical Data: The patient found on floor unconscious, history of frontal  meningioma resection in September 2013  CT HEAD WITHOUT CONTRAST  Technique:  Contiguous axial images were obtained from the base of the skull through the vertex without contrast.  Comparison: Prior head CT 01/09/2012  Findings: Calvarium is intact except for frontal craniotomy.  There is no evidence of hemorrhage, infarct, or mass.  Clips in the anterior midline are again identified postoperatively.  There is multi focal paranasal sinus chronic appearing inflammatory change, which appears increased when compared to the prior study.  IMPRESSION: Stable postoperative change.  No acute findings.   Original Report Authenticated By: Esperanza Heir, M.D.    Ct Cervical Spine Wo Contrast  02/15/2012  *RADIOLOGY REPORT*  Clinical Data: Loss of consciousness.  The patient was found on the floor.  CT CERVICAL SPINE WITHOUT CONTRAST  Technique:  Multidetector CT imaging of the cervical spine was performed. Multiplanar CT image reconstructions were also generated.  Comparison: CT scan dated 06/10/2006  Findings: There is no acute fracture, subluxation, or prevertebral soft tissue swelling.  Slight degenerative disc disease at C5-6. Old deformity of the anterior superior endplate of T2.  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  IMPRESSION: No acute abnormality of the cervical spine.   Original Report Authenticated By: Francene Boyers, M.D.       Medications:  I have reviewed the patient's current medications.  Impression: 1. Altered mental status likely secondary to polypharmacy. 2. Type 2 diabetes mellitus. 3. Hypertension. 4. Bipolar disorder. 5. Obesity. 6. Acute renal failure, resolved. 7. Status post recent surgery for meningioma.     Plan: 1. Discontinue IV fluids and encourage oral intake. 2. Physical therapy. 3. Possibly home tomorrow.     LOS: 1 day   Wilson Singer Pager [phone]  02/16/2012, 9:35 AM

## 2012-02-16 NOTE — Progress Notes (Signed)
Pt states she wears nasal mask CPAP at bed time. She refused for tonight.

## 2012-02-16 NOTE — Evaluation (Signed)
Physical Therapy Evaluation Patient Details Name: [name] MRN: [id number] DOB: 08-27-1950 Today's Date: 02/16/2012 Time: 6295-2841 PT Time Calculation (min): 19 min  PT Assessment / Plan / Recommendation Clinical Impression  Pt is a 61 year old admitted to [facility] for AMS and referred to PT for general mobility.    PT Assessment  Patent does not need any further PT services    Follow Up Recommendations  No PT follow up    Does the patient have the potential to tolerate intense rehabilitation      Barriers to Discharge        Equipment Recommendations  None recommended by PT    Recommendations for Other Services     Frequency      Precautions / Restrictions     Pertinent Vitals/Pain No-denies      Mobility  Bed Mobility Bed Mobility: Supine to Sit Supine to Sit: 6: Modified independent (Device/Increase time) Transfers Transfers: Sit to Stand;Stand to Sit Sit to Stand: 5: Supervision Stand to Sit: 5: Supervision Details for Transfer Assistance: cueing for handplacement for RW Ambulation/Gait Ambulation/Gait Assistance: 5: Supervision Ambulation Distance (Feet): 75 Feet Assistive device: Rolling walker Ambulation/Gait Assistance Details: RW x 65 feet, supervision x10 feet Gait Pattern: Trunk flexed;Step-through pattern Gait velocity: decreased    Shoulder Instructions     Exercises     PT Diagnosis:    PT Problem List:   PT Treatment Interventions:     PT Goals    Visit Information  Last PT Received On: 02/16/12    Subjective Data  Subjective: I want to be able to go back to my apartment   Prior Functioning  Home Living Lives With: Other (Comment) (Aide for 6 hours) Available Help at Discharge: Home health Type of Home: Apartment Home Access: Level entry Home Layout: One level Bathroom Shower/Tub: Tub/shower unit Home Adaptive Equipment: Straight cane;Walker - rolling;Bedside commode/3-in-1;Wheelchair - manual;Shower chair with back Prior  Function Level of Independence: Needs assistance Able to Take Stairs?: No    Cognition       Extremity/Trunk Assessment     Balance Balance Balance Assessed: Yes Static Sitting Balance Static Sitting - Comment/# of Minutes: able to sit independently EOB w/o UE A x2 minutes Static Standing Balance Static Standing - Comment/# of Minutes: unsupported x2 minutes   End of Session PT - End of Session Equipment Utilized During Treatment: Gait belt Activity Tolerance: Patient tolerated treatment well Patient left: in chair;with call bell/phone within reach Nurse Communication: Mobility status    Stacia Feazell, PT 02/16/2012, 12:08 PM

## 2012-02-17 LAB — GLUCOSE, CAPILLARY: Glucose-Capillary: 140 mg/dL — ABNORMAL HIGH (ref 70–99)

## 2012-02-17 LAB — BASIC METABOLIC PANEL
CO2: 26 mEq/L (ref 19–32)
Chloride: 108 mEq/L (ref 96–112)
GFR calc non Af Amer: >90 mL/min (ref >90)
Glucose, Bld: 130 mg/dL — ABNORMAL HIGH (ref 70–99)
Potassium: 3.8 mEq/L (ref 3.5–5.1)
Sodium: 143 mEq/L (ref 135–145)

## 2012-02-17 IMAGING — MR MR LUMBAR SPINE W/O CM
4 of 5 series · 24 of 48 positions shown · non-contrast
Comparison: Lumbar spine MRI 01/26/2010.

CLINICAL DATA: Low back and bilateral leg pain.

MRI LUMBAR SPINE WITHOUT CONTRAST
TECHNIQUE: Multiplanar and multiecho pulse sequences of the lumbar
spine were obtained without intravenous contrast.

[Series 4: T2 · sagittal · 4.0mm · 0.75mm/px · 6 of 12 slices shown (1 of 2)]
[im 1/12]
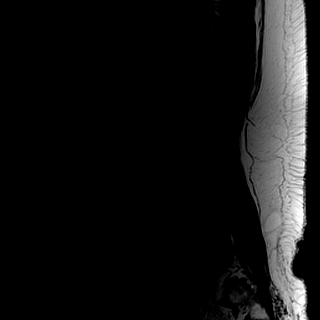
[im 3/12]
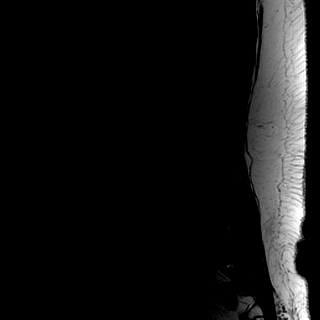
[im 5/12]
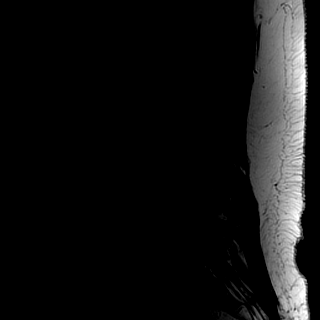
[im 7/12]
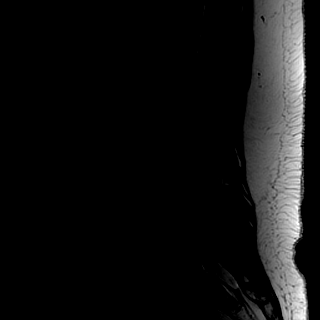
[im 9/12]
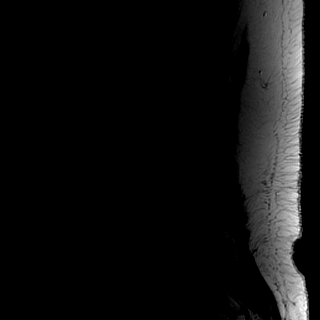
[im 12/12]
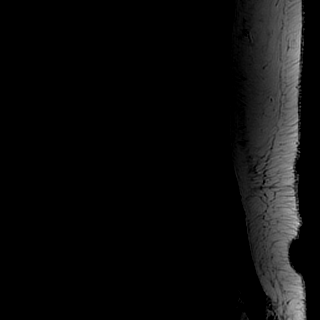

[Series 5: T1 · sagittal · 4.0mm · 0.38mm/px · 5 of 12 slices shown (1 of 2)]
[im 1/12]
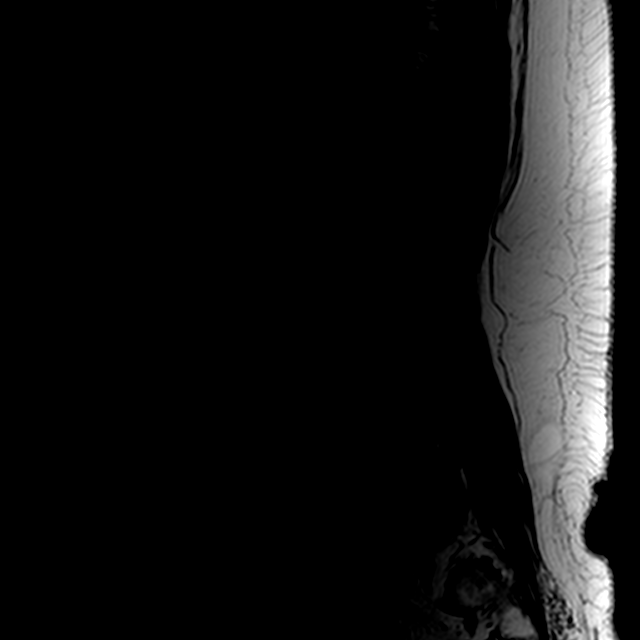
[im 3/12]
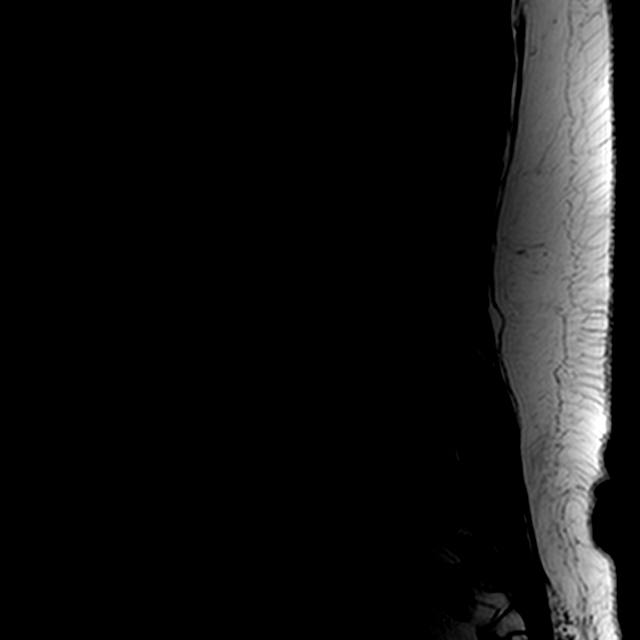
[im 6/12]
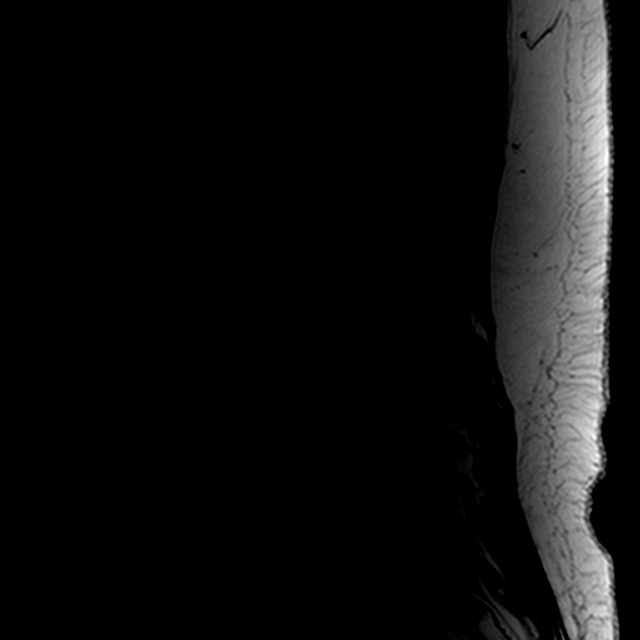
[im 9/12]
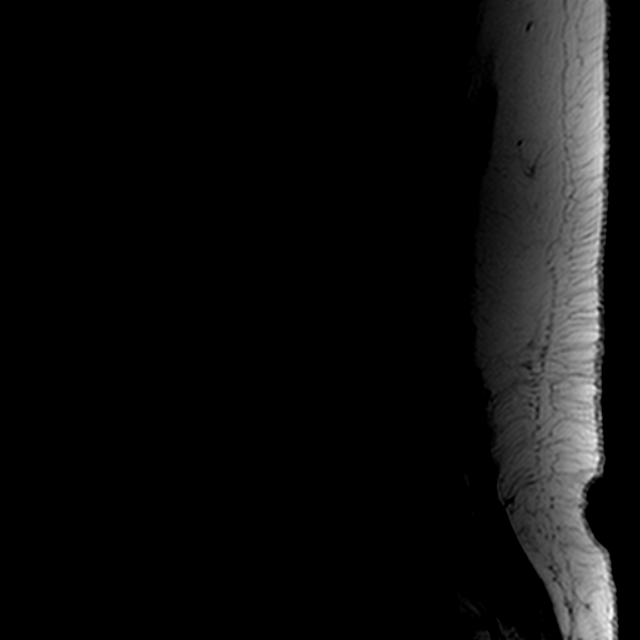
[im 12/12]
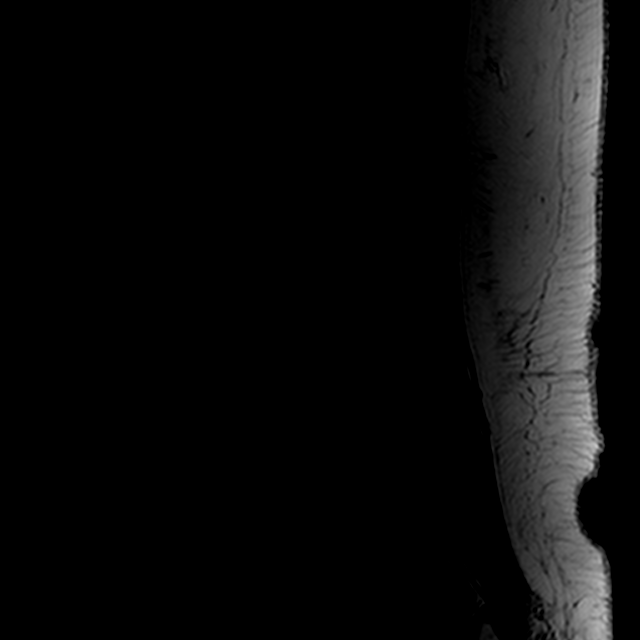

[Series 7: T2 · axial · 4.0mm · 0.54mm/px · z∈[-50,+125]mm · 10 of 38 slices shown (2 of 2)]
[im 3/38]
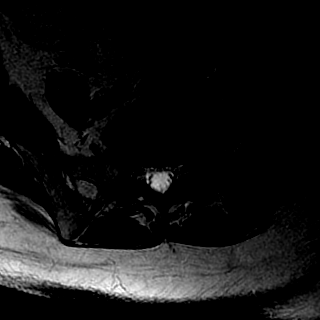
[im 5/38]
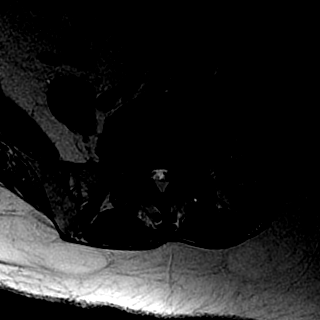
[im 8/38]
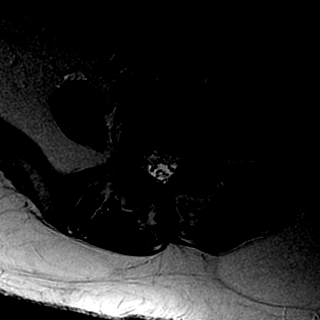
[im 13/38]
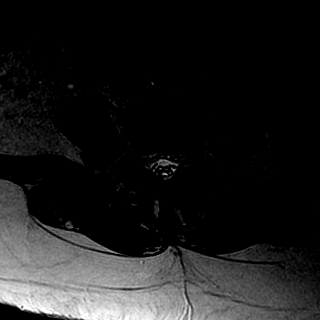
[im 18/38]
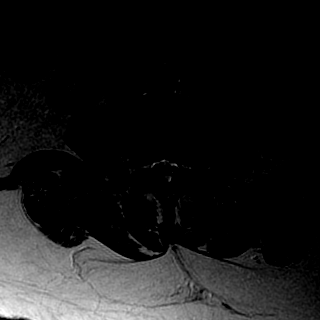
[im 20/38]
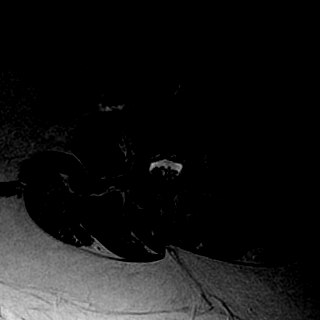
[im 23/38]
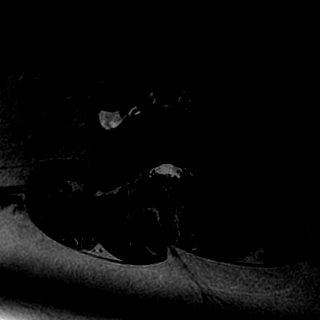
[im 28/38]
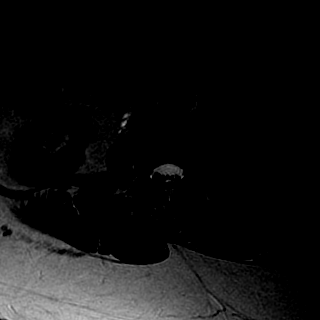
[im 33/38]
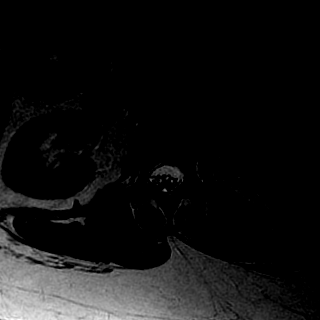
[im 38/38]
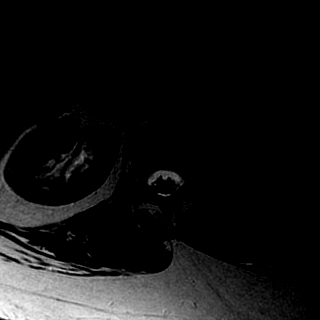

[Series 8: T1 · axial · 4.0mm · 0.27mm/px · z∈[-41,+103]mm · 3 of 38 slices shown (2 of 2)]
[im 5/38]
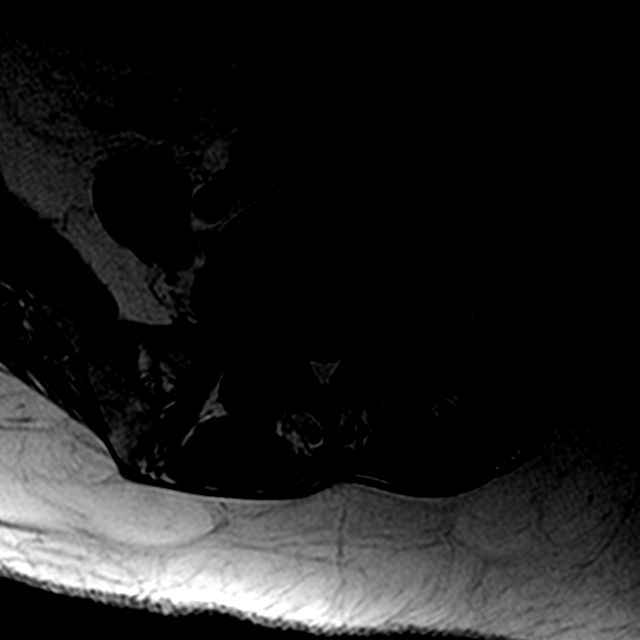
[im 20/38]
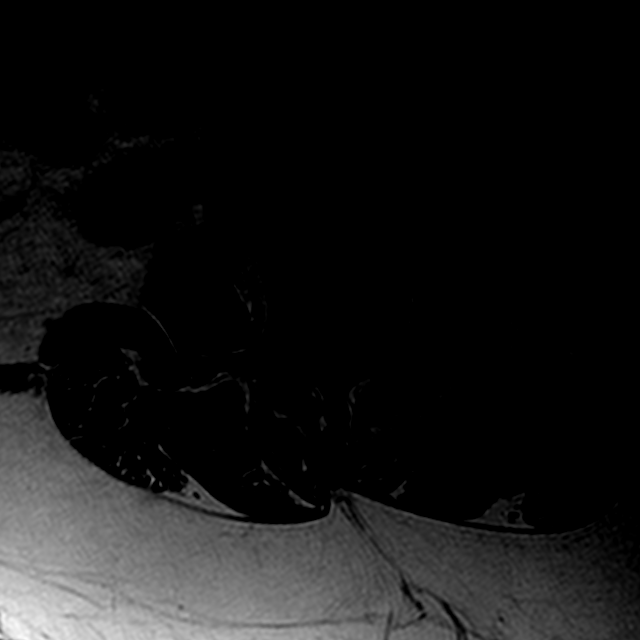
[im 33/38]
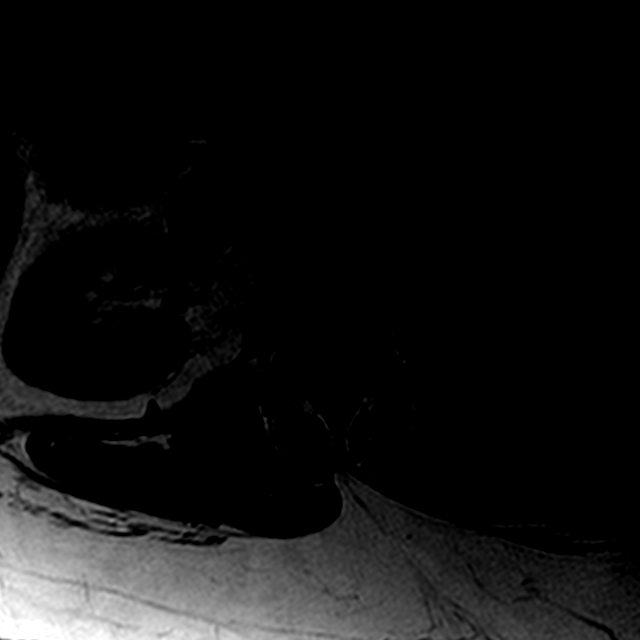

[24 of 48 positions shown; findings below may reference images not displayed]

FINDINGS: Stable degenerative lumbar spondylosis with advanced disc
disease and facet disease.  The vertebral bodies demonstrate normal
marrow signal except for endplate reactive changes.  The same
numbering system is used with the last full intervertebral disc
space being labeled L5-S1.  The conus medullaris terminates at L1.

L1-2:  Mild bulging annulus with minimal lateral recess
encroachment, right greater than left.  No significant spinal or
foraminal stenosis.

L2-3:  Diffuse annular bulge and mild facet disease contributing to
mild bilateral lateral recess stenosis, right greater than left.

L3-4:  Broad-based disc protrusion, short pedicles and advanced
facet disease contribute to moderate to moderately severe spinal
and bilateral lateral recess stenosis, left greater than right.
There is also mild foraminal stenosis bilaterally.  This is a
progressive finding when compared with the prior examination where
there was mainly left lateral recess and left foraminal stenosis.

L4-5:  Broad-based disc protrusion, short pedicles and facet
disease contribute to moderate spinal and bilateral lateral recess
stenosis.  Mild foraminal stenosis, right greater than left.  No
significant change.

L5-S1:  Broad-based disc protrusion, short pedicles and advanced
facet disease with mild to moderate spinal and bilateral lateral
recess stenosis and moderately severe bilateral foraminal stenosis.
These findings are stable.
IMPRESSION: Advanced degenerative lumbar spondylosis with disc disease and
facet disease.  Multifactorial spinal, lateral recess and foraminal
stenosis at L3-4, L4-5 and L5-S1 as specifically discussed above.

## 2012-02-17 MED ORDER — LORAZEPAM 1 MG PO TABS: 0.5000 mg | ORAL_TABLET | Freq: Three times a day (TID) | ORAL | Status: DC | PRN

## 2012-02-17 NOTE — Discharge Summary (Signed)
Physician Discharge Summary  [name] [name]:[id number] DOB: [birth year] DOA: 02/15/2012  PCP: Syliva Overman, MD  Admit date: 02/15/2012 Discharge date: 02/17/2012  Time spent: Greater than 30 minutes  Recommendations for Outpatient Follow-up:  1. Followup with primary care physician.   Discharge Diagnoses:  1. Altered mental status secondary to polypharmacy. No evidence of acute brain injury. 2. Status post surgery for frontal meningioma. 3. Hypertension. 4. Type 2 diabetes mellitus. 5. Obesity.   Discharge Condition: Stable and improved.  Diet recommendation: Carbohydrate modified diet.  Filed Weights   02/15/12 1430 02/16/12 0518  Weight: 68.6 kg (151 lb 3.8 oz) 69.8 kg (153 lb 14.1 oz)    History of present illness:  This 61 year old lady presented to the hospital with altered mental status. Please see initial history as outlined below: HPI: [name] is a 61 y.o. female presents with the above symptoms. She was found this morning at her home by the visiting home nurse to be on the floor. Patient was lethargic in the emergency room and could not give much of a history. Blood glucose was not decreased. The patient is on several medications that could potentially make her drowsy. She apparently dispenses her medications to her self. She recently was hospitalized in September under the care of neurosurgery whereupon she had a bicoronal craniotomy and total resection of a subfrontal meningioma. This meningioma was associated with vasogenic cerebral edema. She then spent 3 weeks in a nursing home and has been home since that time. She is on several medications such as benzodiazepines and opioids which could have made her drowsy. I suspect she probably took too many of one of the medications.  Hospital Course:   the patient was admitted and watched closely in the step down unit. CT brain scan was negative for any acute pathology. Several of her medications known to cause sedation  were discontinued. She was given intravenous fluids. Serial cardiac enzymes were negative. She also was clinically dehydrated and elevated creatinine on admission, she was given intravenous fluids. Within the next 24 hours she improved dramatically to being back to her normal state. She was seen by physical therapy who felt that she did not require any of their services as she was able to walk independently. This morning she is much the same, in an improved state. She is keen to go home.  Procedures:  None.   Consultations:  None.  Discharge Exam: Filed Vitals:   02/16/12 2043 02/16/12 2100 02/16/12 2130 02/17/12 0734  BP:   150/76 164/73  Pulse:    65  Temp:  99.5 F (37.5 C) 98.8 F (37.1 C) 98.7 F (37.1 C)  TempSrc:  Oral Oral Oral  Resp:    18  Height:      Weight:      SpO2: 98%   99%    General: She looks systemically well. She is alert and orientated. Cardiovascular: Heart sounds are present without gallop rhythm or murmurs. Respiratory: Lung fields are clear.  Discharge Instructions  Discharge Orders    Future Appointments: Provider: Department: Dept Phone: Center:   06/14/2012 10:15 AM Kerri Perches, MD [facility] [phone] [facility]     Future Orders Please Complete By Expires   Diet - low sodium heart healthy      Increase activity slowly          Medication List     As of 02/17/2012  7:50 AM    STOP taking these medications  promethazine 12.5 MG tablet   Commonly known as: PHENERGAN      sulfamethoxazole-trimethoprim 800-160 MG per tablet   Commonly known as: BACTRIM DS,SEPTRA DS      tiZANidine 4 MG tablet   Commonly known as: ZANAFLEX      TAKE these medications         acetaminophen 325 MG tablet   Commonly known as: TYLENOL   Take 1-2 tablets (325-650 mg total) by mouth every 4 (four) hours as needed for pain.      benzonatate 100 MG capsule   Commonly known as: TESSALON   Take 1 capsule (100 mg total) by mouth  every 6 (six) hours as needed for cough.      cloNIDine 0.3 MG tablet   Commonly known as: CATAPRES   Take 0.3 mg by mouth 3 (three) times daily. Patient takes at 8am,4pm,and midnight      clopidogrel 75 MG tablet   Commonly known as: PLAVIX   Take 75 mg by mouth daily.      clotrimazole-betamethasone cream   Commonly known as: LOTRISONE   Apply topically 2 (two) times daily. Apply twice daily to affected areas for 2 weeks, then only as needed      cyclobenzaprine 10 MG tablet   Commonly known as: FLEXERIL   Take 10 mg by mouth daily as needed. spasm      cycloSPORINE 0.05 % ophthalmic emulsion   Commonly known as: RESTASIS   1 drop 2 (two) times daily.      diclofenac sodium 1 % Gel   Commonly known as: VOLTAREN   Apply twice daily, as needed to leftleg for pain      diphenoxylate-atropine 2.5-0.025 MG per tablet   Commonly known as: LOMOTIL   Take 1 tablet by mouth 4 (four) times daily as needed. For loose stools      ergocalciferol 50000 UNITS capsule   Commonly known as: VITAMIN D2   Take 1 capsule (50,000 Units total) by mouth once a week. Patient takes on Sundays      fluticasone 50 MCG/ACT nasal spray   Commonly known as: FLONASE   Place 1 spray into the nose daily. Allergies      hydrochlorothiazide 12.5 MG capsule   Commonly known as: MICROZIDE   Take 12.5 mg by mouth daily.      lamoTRIgine 100 MG tablet   Commonly known as: LAMICTAL   Take 1 tablet (100 mg total) by mouth every morning.      LEVOXYL 50 MCG tablet   Generic drug: levothyroxine   Take 50 mcg by mouth every morning.      LORazepam 1 MG tablet   Commonly known as: ATIVAN   Take 0.5 tablets (0.5 mg total) by mouth every 8 (eight) hours as needed for anxiety.      meclizine 12.5 MG tablet   Commonly known as: ANTIVERT   Take 12.5 mg by mouth 3 (three) times daily as needed. dizziness      metFORMIN 1000 MG tablet   Commonly known as: GLUCOPHAGE   Take 1 tablet (1,000 mg total) by mouth  2 (two) times daily with a meal.      metoprolol 50 MG tablet   Commonly known as: LOPRESSOR   Take 50 mg by mouth 2 (two) times daily.      mirtazapine 30 MG tablet   Commonly known as: REMERON   Take 30 mg by mouth at bedtime.  montelukast 10 MG tablet   Commonly known as: SINGULAIR   Take 10 mg by mouth at bedtime.      morphine 15 MG tablet   Commonly known as: MSIR   Take 15 mg by mouth 2 (two) times daily as needed. For pain      nystatin powder   Commonly known as: MYCOSTATIN   Apply topically 2 (two) times daily.      ondansetron 4 MG tablet   Commonly known as: ZOFRAN   Take 4 mg by mouth every 8 (eight) hours as needed. nausea      ONGLYZA 5 MG Tabs tablet   Generic drug: saxagliptin HCl   Take 5 mg by mouth Daily.      pregabalin 75 MG capsule   Commonly known as: LYRICA   Take 1 capsule (75 mg total) by mouth 2 (two) times daily.      RABEprazole 20 MG tablet   Commonly known as: ACIPHEX   Take 1 tablet (20 mg total) by mouth daily.      rosuvastatin 10 MG tablet   Commonly known as: CRESTOR   Take 10 mg by mouth daily.      sertraline 100 MG tablet   Commonly known as: ZOLOFT   Take 150 mg by mouth at bedtime.      thiothixene 2 MG capsule   Commonly known as: NAVANE   Take 2 mg by mouth at bedtime.      traZODone 50 MG tablet   Commonly known as: DESYREL   Take 50 mg by mouth at bedtime.      triamcinolone cream 0.1 %   Commonly known as: KENALOG   Apply 0.1 % topically Twice daily as needed. itching          The results of significant diagnostics from this hospitalization (including imaging, microbiology, ancillary and laboratory) are listed below for reference.    Significant Diagnostic Studies: Dg Chest 1 View  02/15/2012  *RADIOLOGY REPORT*  Clinical Data: Loss of consciousness  CHEST - 1 VIEW  Comparison: Some 11/23/2011  Findings: Cardiomediastinal silhouette is stable.  The study is limited by poor inspiration.  Bilateral  basilar atelectasis. Central mild vascular congestion without pulmonary edema.  IMPRESSION: Limited study by poor inspiration.  Mild basilar atelectasis.  No convincing pulmonary edema.   Original Report Authenticated By: Natasha Mead, M.D.    Ct Head Wo Contrast  02/15/2012  *RADIOLOGY REPORT*  Clinical Data: The patient found on floor unconscious, history of frontal meningioma resection in September 2013  CT HEAD WITHOUT CONTRAST  Technique:  Contiguous axial images were obtained from the base of the skull through the vertex without contrast.  Comparison: Prior head CT 01/09/2012  Findings: Calvarium is intact except for frontal craniotomy.  There is no evidence of hemorrhage, infarct, or mass.  Clips in the anterior midline are again identified postoperatively.  There is multi focal paranasal sinus chronic appearing inflammatory change, which appears increased when compared to the prior study.  IMPRESSION: Stable postoperative change.  No acute findings.   Original Report Authenticated By: Esperanza Heir, M.D.    Ct Cervical Spine Wo Contrast  02/15/2012  *RADIOLOGY REPORT*  Clinical Data: Loss of consciousness.  The patient was found on the floor.  CT CERVICAL SPINE WITHOUT CONTRAST  Technique:  Multidetector CT imaging of the cervical spine was performed. Multiplanar CT image reconstructions were also generated.  Comparison: CT scan dated 06/10/2006  Findings: There is no acute fracture, subluxation,  or prevertebral soft tissue swelling.  Slight degenerative disc disease at C5-6. Old deformity of the anterior superior endplate of T2.  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  IMPRESSION: No acute abnormality of the cervical spine.   Original Report Authenticated By: Francene Boyers, M.D.     Microbiology: Recent Results (from the past 240 hour(s))  CULTURE, BLOOD (ROUTINE X 2)     Status: Normal (Preliminary result)   Collection Time   02/15/12 10:33 AM      Component Value Range Status  Comment   Specimen Description BLOOD LEFT ARM   Final    Special Requests BOTTLES DRAWN AEROBIC ONLY 4CC   Final    Culture NO GROWTH 1 DAY   Final    Report Status PENDING   Incomplete   CULTURE, BLOOD (ROUTINE X 2)     Status: Normal (Preliminary result)   Collection Time   02/15/12 10:43 AM      Component Value Range Status Comment   Specimen Description BLOOD RIGHT HAND   Final    Special Requests BOTTLES DRAWN AEROBIC ONLY 4CC   Final    Culture NO GROWTH 1 DAY   Final    Report Status PENDING   Incomplete   MRSA PCR SCREENING     Status: Abnormal   Collection Time   02/15/12  3:54 PM      Component Value Range Status Comment   MRSA by PCR POSITIVE (*) NEGATIVE Final      Labs: Basic Metabolic Panel:  Lab 02/17/12 1610 02/16/12 0431 02/15/12 1032  NA 143 140 142  K 3.8 4.0 3.8  CL 108 107 105  CO2 26 25 26   GLUCOSE 130* 162* 74  BUN 11 16 30*  CREATININE 0.70 0.73 1.49*  CALCIUM 8.8 8.5 8.9  MG -- -- --  PHOS -- -- --   Liver Function Tests:  Lab 02/16/12 0431 02/15/12 1032  AST 30 29  ALT 24 24  ALKPHOS 99 85  BILITOT 0.2* 0.1*  PROT 6.8 6.8  ALBUMIN 3.0* 3.1*     Lab 02/15/12 1033  AMMONIA 32   CBC:  Lab 02/16/12 0431 02/15/12 1032  WBC 5.7 7.5  NEUTROABS -- 4.9  HGB 9.0* 9.9*  HCT 30.2* 33.0*  MCV 72.9* 73.0*  PLT 213 232   Cardiac Enzymes:  Lab 02/16/12 0430 02/15/12 2225 02/15/12 1717 02/15/12 1032  CKTOTAL -- -- -- 692*  CKMB -- -- -- --  CKMBINDEX -- -- -- --  TROPONINI <0.30 <0.30 <0.30 <0.30     CBG:  Lab 02/17/12 0722 02/16/12 2103 02/16/12 1639 02/16/12 1122 02/16/12 0718  GLUCAP 140* 114* 123* 151* 153*       Signed:  Morgan Rennert C  [facility] 02/17/2012, 7:50 AM

## 2012-02-17 NOTE — Progress Notes (Signed)
Client is stable at this time, blood sugars have been within normal limits. Clients home medications were returned to client from pharmacy.  Reviewed medication changes and medication schedule with client. Client has home health nurse thru [facility]. [facility] nurse aware of clients hospitalization and to follow up with client. Discharge instructions reviewed, instructed on s/s to report to MD/EMS. Understanding voiced to instructions. Client has [facility] services and clients CNA transported client back home.

## 2012-02-19 ENCOUNTER — Other Ambulatory Visit: Payer: Self-pay | Admitting: Family Medicine

## 2012-02-19 DIAGNOSIS — Z1231 Encounter for screening mammogram for malignant neoplasm of breast: Secondary | ICD-10-CM

## 2012-02-20 ENCOUNTER — Telehealth: Payer: Self-pay | Admitting: Family Medicine

## 2012-02-20 LAB — CULTURE, BLOOD (ROUTINE X 2): Culture: NO GROWTH

## 2012-02-22 ENCOUNTER — Telehealth: Payer: Self-pay | Admitting: Family Medicine

## 2012-02-26 ENCOUNTER — Telehealth: Payer: Self-pay | Admitting: Family Medicine

## 2012-02-28 NOTE — Telephone Encounter (Signed)
A paper was put in your box to sign for her to get the bed a couple days ago. Has it been completed?

## 2012-03-01 ENCOUNTER — Telehealth: Payer: Self-pay | Admitting: Family Medicine

## 2012-03-01 NOTE — Telephone Encounter (Signed)
Concern addressed

## 2012-03-01 NOTE — Telephone Encounter (Signed)
Called [employer] and they are going to refax the form

## 2012-03-01 NOTE — Telephone Encounter (Signed)
pls send for the paper again I do not recall seeing it  and unfortunetely do not have it

## 2012-03-03 NOTE — Assessment & Plan Note (Signed)
Controlled, no change in medication  

## 2012-03-03 NOTE — Assessment & Plan Note (Signed)
Over corrected, reduced dose of medication, due to reduced intake and weight loss

## 2012-03-03 NOTE — Assessment & Plan Note (Signed)
Stable and controlled

## 2012-03-03 NOTE — Assessment & Plan Note (Signed)
Antibiotic and decongestant prescribed 

## 2012-03-03 NOTE — Assessment & Plan Note (Signed)
Antibiotic prescribed, rest and fluids advised

## 2012-03-03 NOTE — Assessment & Plan Note (Signed)
Flare in skin infection medication prescribed

## 2012-03-03 NOTE — Assessment & Plan Note (Signed)
In narcotic pain contract with pain management despite back surgery, unfortunately, i have reservations as to ho capable of safe med , she does have a nurse visit regularly to assist with this

## 2012-03-04 NOTE — Telephone Encounter (Signed)
Has knots popping up on her forehead near incision and nasal congestion and drainage. Advised to call her Dr who did the surgery and she could use sudafed for the sinuses. And to call back for appt if needed

## 2012-03-09 ENCOUNTER — Encounter (HOSPITAL_COMMUNITY): Payer: Self-pay | Admitting: *Deleted

## 2012-03-09 ENCOUNTER — Emergency Department (HOSPITAL_COMMUNITY)
Admission: EM | Admit: 2012-03-09 | Discharge: 2012-03-09 | Disposition: A | Discharge status: Home / Self Care | Payer: Medicare Other | Attending: Emergency Medicine | Admitting: Emergency Medicine

## 2012-03-09 ENCOUNTER — Emergency Department (HOSPITAL_COMMUNITY): Payer: Medicare Other

## 2012-03-09 DIAGNOSIS — F329 Major depressive disorder, single episode, unspecified: Secondary | ICD-10-CM | POA: Insufficient documentation

## 2012-03-09 DIAGNOSIS — F17200 Nicotine dependence, unspecified, uncomplicated: Secondary | ICD-10-CM | POA: Insufficient documentation

## 2012-03-09 DIAGNOSIS — F3289 Other specified depressive episodes: Secondary | ICD-10-CM | POA: Insufficient documentation

## 2012-03-09 DIAGNOSIS — Z8719 Personal history of other diseases of the digestive system: Secondary | ICD-10-CM | POA: Insufficient documentation

## 2012-03-09 DIAGNOSIS — Z79899 Other long term (current) drug therapy: Secondary | ICD-10-CM

## 2012-03-09 DIAGNOSIS — R296 Repeated falls: Secondary | ICD-10-CM

## 2012-03-09 DIAGNOSIS — IMO0002 Reserved for concepts with insufficient information to code with codable children: Secondary | ICD-10-CM | POA: Insufficient documentation

## 2012-03-09 DIAGNOSIS — H409 Unspecified glaucoma: Secondary | ICD-10-CM | POA: Insufficient documentation

## 2012-03-09 DIAGNOSIS — E039 Hypothyroidism, unspecified: Secondary | ICD-10-CM | POA: Insufficient documentation

## 2012-03-09 DIAGNOSIS — F411 Generalized anxiety disorder: Secondary | ICD-10-CM | POA: Insufficient documentation

## 2012-03-09 DIAGNOSIS — R4182 Altered mental status, unspecified: Secondary | ICD-10-CM | POA: Insufficient documentation

## 2012-03-09 DIAGNOSIS — K219 Gastro-esophageal reflux disease without esophagitis: Secondary | ICD-10-CM | POA: Insufficient documentation

## 2012-03-09 DIAGNOSIS — Z8669 Personal history of other diseases of the nervous system and sense organs: Secondary | ICD-10-CM | POA: Insufficient documentation

## 2012-03-09 DIAGNOSIS — Z8679 Personal history of other diseases of the circulatory system: Secondary | ICD-10-CM | POA: Insufficient documentation

## 2012-03-09 DIAGNOSIS — E119 Type 2 diabetes mellitus without complications: Secondary | ICD-10-CM | POA: Insufficient documentation

## 2012-03-09 DIAGNOSIS — F319 Bipolar disorder, unspecified: Secondary | ICD-10-CM | POA: Insufficient documentation

## 2012-03-09 DIAGNOSIS — G43909 Migraine, unspecified, not intractable, without status migrainosus: Secondary | ICD-10-CM | POA: Insufficient documentation

## 2012-03-09 DIAGNOSIS — G8929 Other chronic pain: Secondary | ICD-10-CM | POA: Insufficient documentation

## 2012-03-09 DIAGNOSIS — Z8673 Personal history of transient ischemic attack (TIA), and cerebral infarction without residual deficits: Secondary | ICD-10-CM | POA: Insufficient documentation

## 2012-03-09 DIAGNOSIS — Z8739 Personal history of other diseases of the musculoskeletal system and connective tissue: Secondary | ICD-10-CM | POA: Insufficient documentation

## 2012-03-09 DIAGNOSIS — Z7901 Long term (current) use of anticoagulants: Secondary | ICD-10-CM | POA: Insufficient documentation

## 2012-03-09 DIAGNOSIS — I10 Essential (primary) hypertension: Secondary | ICD-10-CM | POA: Insufficient documentation

## 2012-03-09 DIAGNOSIS — Z9181 History of falling: Secondary | ICD-10-CM | POA: Insufficient documentation

## 2012-03-09 DIAGNOSIS — Q391 Atresia of esophagus with tracheo-esophageal fistula: Secondary | ICD-10-CM | POA: Insufficient documentation

## 2012-03-09 LAB — CBC
MCV: 71.9 fL — ABNORMAL LOW (ref 78.0–100.0)
Platelets: 241 10*3/uL (ref 150–400)
RBC: 4.69 MIL/uL (ref 3.87–5.11)
WBC: 6.7 10*3/uL (ref 4.0–10.5)

## 2012-03-09 LAB — COMPREHENSIVE METABOLIC PANEL
Albumin: 3.4 g/dL — ABNORMAL LOW (ref 3.5–5.2)
BUN: 20 mg/dL (ref 6–23)
Calcium: 8.9 mg/dL (ref 8.4–10.5)
Chloride: 102 mEq/L (ref 96–112)
Creatinine, Ser: 1.6 mg/dL — ABNORMAL HIGH (ref 0.50–1.10)
GFR calc non Af Amer: 34 mL/min — ABNORMAL LOW (ref >90)
Total Bilirubin: 0.1 mg/dL — ABNORMAL LOW (ref 0.3–1.2)

## 2012-03-09 LAB — URINALYSIS, ROUTINE W REFLEX MICROSCOPIC
Leukocytes, UA: NEGATIVE
Nitrite: NEGATIVE
Specific Gravity, Urine: 1.025 (ref 1.005–1.030)
pH: 5.5 (ref 5.0–8.0)

## 2012-03-09 MED ORDER — NALOXONE HCL 0.4 MG/ML IJ SOLN
0.2000 mg | Freq: Once | INTRAMUSCULAR | Status: AC
  Administered 2012-03-09: 0.2 mg via INTRAVENOUS

## 2012-03-09 MED ORDER — NALOXONE HCL 0.4 MG/ML IJ SOLN
0.4000 mg | INTRAMUSCULAR | Status: DC | PRN
  Administered 2012-03-09: 0.4 mg via INTRAVENOUS
  Filled 2012-03-09: qty 1

## 2012-03-09 MED ORDER — NALOXONE HCL 0.4 MG/ML IJ SOLN
0.2000 mg | Freq: Once | INTRAMUSCULAR | Status: AC
  Administered 2012-03-09: 0.2 mg via INTRAVENOUS
  Filled 2012-03-09: qty 1

## 2012-03-09 NOTE — ED Notes (Signed)
Called pt's niece Merdis Delay to come get pt but she was unable. I called second emergency contact her son Denyse Amass and he will come to get pt as soon as he can. Pt notified that her son will be here to pick her up.

## 2012-03-09 NOTE — ED Provider Notes (Signed)
History     CSN: [id number]  Arrival date & time 03/09/12  0041   None     Chief Complaint  Patient presents with  . Fall  . Weakness  . Migraine    (Consider location/radiation/quality/duration/timing/severity/associated sxs/prior treatment) HPIAnnie V [name] is a 61 y.o. female presents with weakness and possible fall via EMS. Is not complaining about any pain at this time. There are no signs of trauma. Patient was somnolent and gives a poor history initially. Patient received 0.2 mg of Narcan and became much more responsive. Patient has a history of altered mental status secondary to polypharmacy from last admission on 02/15/2012. Patient does have a history of bicortical craniotomy and total resection of a subfrontal meningioma in September. Patient lives by herself in an apartment. Patient is on multiple sedating medications including Flexeril, lorazepam, meclizine, mirtazapine, morphine tablets, and trazodone. Patient was observed in the ICU on her last admission, given fluids, medications were allowed to clear and the patient improved. Per her records, patient presents similar as she did in the past.  Past Medical History  Diagnosis Date  . Bipolar disorder   . CVA (cerebral infarction)   . Pancreatitis 2006    due to Depakote with normal EUS   . Osteoporosis   . Chronic back pain   . Diabetes mellitus   . Trigger finger   . Anxiety disorder   . Hypertension   . Migraines   . Diverticulosis     TCS 9/08 by Dr. Lina Sar for diarrhea . Bx for micro scopic colitis negative.   . Schatzki's ring     non critical / EGD with ED 8/2011with RMR  . S/P colonoscopy 7829,5621, 2011    left-sided diverticula, hx of simple adenomas . 2011, random bx negative for microscopic colitis  . Glaucoma(365)   . Allergic rhinitis   . Hypothyroidism     thyroid goiter  . Anemia   . Blood transfusion   . GERD (gastroesophageal reflux disease)   . Stroke     left sided weakness  .  Seizures     unknown etiology-on meds-last seizure was 3 yerars ago  . Anxiety   . Depression   . Metabolic encephalopathy 08/03/2011  . Sleep apnea   . Arthritis   . Gum symptoms     infection on antibiotic    Past Surgical History  Procedure Date  . Abdominal hysterectomy   . Cholecystectomy   . Ovarian cyst removal   . Carpal tunnel release 07/22/04    left/ Dr. Romeo Apple   . Breast reduction surgery   . Bilateral cataract surgery   . Biopsy of thyroid gland 2009  . Surgical excision of 3 tumors from right thigh and right buttock  and left upper thigh 2010  . Back surgery July 2012  . Spine surgery 09/29/2010    dr brooks  . Maloney dilation 12/29/2010    Procedure: Elease Hashimoto DILATION;  Surgeon: Corbin Ade, MD;  Location: [facility];  Service: Endoscopy;  Laterality: N/A;  56, 58,  . Esophagogastroduodenoscopy 12/29/2010    Rourk-Retained food in the esophagus and stomach, small hiatal hernia, status post Maloney dilation of the esophagus  . Craniotomy 11/23/2011    Procedure: CRANIOTOMY TUMOR EXCISION;  Surgeon: Hewitt Shorts, MD;  Location: [facility];  Service: Neurosurgery;  Laterality: N/A;  Craniotomy for tumor resection  . Brain surgery 11/2011    resection of meningioma    Family History  Problem Relation  Age of Onset  . Heart attack Mother   . Pneumonia Father   . Kidney failure Father   . Cancer Sister     pancreatic  . Diabetes Brother   . Hypertension Brother   . Colon cancer Neg Hx   . Anesthesia problems Neg Hx   . Hypotension Neg Hx   . Malignant hyperthermia Neg Hx   . Pseudochol deficiency Neg Hx     History  Substance Use Topics  . Smoking status: Current Every Day Smoker -- 0.2 packs/day for 7 years    Types: Cigarettes  . Smokeless tobacco: Never Used     Comment: started at 24 smoked 6 yrs and stopped. restarted 2 months ago  . Alcohol Use: No     Comment: 5 cigs/day     OB History    Grav Para Term Preterm Abortions TAB SAB Ect  Mult Living                  Review of Systems At least 10pt or greater review of systems completed and are negative except where specified in the HPI.  Allergies  Cephalexin; Milk-related compounds; Penicillins; and Phenazopyridine hcl  Home Medications   Current Outpatient Rx  Name  Route  Sig  Dispense  Refill  . ACETAMINOPHEN 325 MG PO TABS   Oral   Take 1-2 tablets (325-650 mg total) by mouth every 4 (four) hours as needed for pain.   1 tablet   0   . BENZONATATE 100 MG PO CAPS   Oral   Take 1 capsule (100 mg total) by mouth every 6 (six) hours as needed for cough.   30 capsule   0   . CLONIDINE HCL 0.3 MG PO TABS   Oral   Take 0.3 mg by mouth 3 (three) times daily. Patient takes at 8am,4pm,and midnight         . CLOPIDOGREL BISULFATE 75 MG PO TABS   Oral   Take 75 mg by mouth daily.         [name] CLOTRIMAZOLE-BETAMETHASONE 1-0.05 % EX CREA   Topical   Apply topically 2 (two) times daily. Apply twice daily to affected areas for 2 weeks, then only as needed   45 g   0   . CYCLOBENZAPRINE HCL 10 MG PO TABS   Oral   Take 10 mg by mouth daily as needed. spasm         . CYCLOSPORINE 0.05 % OP EMUL      1 drop 2 (two) times daily.         [name] DICLOFENAC SODIUM 1 % TD GEL      Apply twice daily, as needed to leftleg for pain   100 g   1   . DIPHENOXYLATE-ATROPINE 2.5-0.025 MG PO TABS   Oral   Take 1 tablet by mouth 4 (four) times daily as needed. For loose stools         . ERGOCALCIFEROL 50000 UNITS PO CAPS   Oral   Take 1 capsule (50,000 Units total) by mouth once a week. Patient takes on Sundays   4 capsule   2   . FLUTICASONE PROPIONATE 50 MCG/ACT NA SUSP   Nasal   Place 1 spray into the nose daily. Allergies   16 g   3   . HYDROCHLOROTHIAZIDE 12.5 MG PO CAPS   Oral   Take 12.5 mg by mouth daily.         [name]  LAMOTRIGINE 100 MG PO TABS   Oral   Take 1 tablet (100 mg total) by mouth every morning.   30 tablet   2   . LEVOTHYROXINE  SODIUM 50 MCG PO TABS   Oral   Take 50 mcg by mouth every morning.          [name] LORAZEPAM 1 MG PO TABS   Oral   Take 0.5 tablets (0.5 mg total) by mouth every 8 (eight) hours as needed for anxiety.   30 tablet      . MECLIZINE HCL 12.5 MG PO TABS   Oral   Take 12.5 mg by mouth 3 (three) times daily as needed. dizziness         . METFORMIN HCL 1000 MG PO TABS   Oral   Take 1 tablet (1,000 mg total) by mouth 2 (two) times daily with a meal.   60 tablet   11     Please note  That patient has changed from liquid  ...   . METOPROLOL TARTRATE 50 MG PO TABS   Oral   Take 50 mg by mouth 2 (two) times daily.         [name] MIRTAZAPINE 30 MG PO TABS   Oral   Take 30 mg by mouth at bedtime.          [name] MONTELUKAST SODIUM 10 MG PO TABS   Oral   Take 10 mg by mouth at bedtime.         . MORPHINE SULFATE 15 MG PO TABS   Oral   Take 15 mg by mouth 2 (two) times daily as needed. For pain         . NYSTATIN 100000 UNIT/GM EX POWD   Topical   Apply topically 2 (two) times daily.         [name] ONDANSETRON HCL 4 MG PO TABS   Oral   Take 4 mg by mouth every 8 (eight) hours as needed. nausea         . ONGLYZA 5 MG PO TABS   Oral   Take 5 mg by mouth Daily.         [name] PREGABALIN 75 MG PO CAPS   Oral   Take 1 capsule (75 mg total) by mouth 2 (two) times daily.   60 capsule   2   . RABEPRAZOLE SODIUM 20 MG PO TBEC   Oral   Take 1 tablet (20 mg total) by mouth daily.   31 tablet   11   . ROSUVASTATIN CALCIUM 10 MG PO TABS   Oral   Take 10 mg by mouth daily.         . SERTRALINE HCL 100 MG PO TABS   Oral   Take 150 mg by mouth at bedtime.          . THIOTHIXENE 2 MG PO CAPS   Oral   Take 2 mg by mouth at bedtime.          . TRAZODONE HCL 50 MG PO TABS   Oral   Take 50 mg by mouth at bedtime.         . TRIAMCINOLONE ACETONIDE 0.1 % EX CREA   Topical   Apply 0.1 % topically Twice daily as needed. itching           BP 91/52  Pulse 71  Temp 97.5  F (36.4 C) (Oral)  Resp 20  Ht 5\' 2"  (1.575 m)  Wt  141 lb (63.957 kg)  BMI 25.79 kg/m2  SpO2 100%  Physical Exam  Nursing notes reviewed.  Electronic medical record reviewed. VITAL SIGNS:   Filed Vitals:   03/09/12 0041 03/09/12 0138 03/09/12 0200  BP: 90/48 91/52 131/60  Pulse: 69 71   Temp: 97.5 F (36.4 C)    TempSrc: Oral    Resp: 16 20 12   Height: 5\' 2"  (1.575 m)    Weight: 141 lb (63.957 kg)    SpO2: 96% 100%    CONSTITUTIONAL: Awake, oriented, somnolent, appears non-toxic HENT: Atraumatic, normocephalic, oral mucosa pink and moist, airway patent. Nares patent without drainage. External ears normal. EYES: Conjunctiva clear, EOMI, PERRLA 2 mm NECK: Trachea midline, non-tender, supple CARDIOVASCULAR: Normal heart rate, Normal rhythm, No murmurs, rubs, gallops PULMONARY/CHEST: Clear to auscultation, no rhonchi, wheezes, or rales. Symmetrical breath sounds. Non-tender. ABDOMINAL: Non-distended, soft, non-tender - no rebound or guarding.  BS normal. NEUROLOGIC: Non-focal, moving all four extremities, no gross sensory or motor deficits. EXTREMITIES: No clubbing, cyanosis, or edema SKIN: Warm, Dry, No erythema, No rash  ED Course  Procedures (including critical care time)  Date: 03/09/2012  Rate: 69  Rhythm: normal sinus rhythm  QRS Axis: normal  Intervals: normal  ST/T Wave abnormalities: normal  Conduction Disutrbances: none  Narrative Interpretation: unremarkable - normal sinus rhythm, same as prior ECG dated 02/15/2012   Labs Reviewed  COMPREHENSIVE METABOLIC PANEL - Abnormal; Notable for the following:    Potassium 3.3 (*)     Creatinine, Ser 1.60 (*)     Albumin 3.4 (*)     Total Bilirubin 0.1 (*)     GFR calc non Af Amer 34 (*)     GFR calc Af Amer 39 (*)     All other components within normal limits  CBC - Abnormal; Notable for the following:    Hemoglobin 10.4 (*)     HCT 33.7 (*)     MCV 71.9 (*)     MCH 22.2 (*)     All other components within  normal limits  GLUCOSE, CAPILLARY   Ct Head Wo Contrast  03/09/2012  *RADIOLOGY REPORT*  Clinical Data: Status post fall; weakness and headache.  CT HEAD WITHOUT CONTRAST  Technique:  Contiguous axial images were obtained from the base of the skull through the vertex without contrast.  Comparison: CT of the head performed 02/15/2012  Findings: There is no evidence of acute infarction, mass lesion, or intra- or extra-axial hemorrhage on CT.  Postoperative changes along the frontal lobes are grossly stable in appearance.  Mild periventricular white matter change may reflect small vessel ischemic microangiopathy.  The posterior fossa, including the cerebellum, brainstem and fourth ventricle, is within normal limits.  The third and lateral ventricles, and basal ganglia are unremarkable in appearance.  The cerebral hemispheres demonstrate grossly normal gray-white differentiation.  No mass effect or midline shift is seen.  There is no evidence of fracture.  The patient is status post frontal craniotomy; the flap is grossly unchanged in appearance. The orbits are within normal limits.  Mild mucosal thickening is noted at the maxillary sinuses; the remaining paranasal sinuses and mastoid air cells are well-aerated.  No significant soft tissue abnormalities are seen.  IMPRESSION:  1.  No evidence of traumatic intracranial injury or fracture. 2.  Postoperative changes along the frontal lobes are grossly stable. 3.  Mild mucosal thickening at the maxillary sinuses.   Original Report Authenticated By: Tonia Ghent, M.D.      1.  Polypharmacy   2. Altered mental status   3. Falls frequently       MDM  [name] is a 61 y.o. female presents with somnolence and polypharmacy. Do not think her somnolence is reflective of any encephalopathy or acute infection. Patient responds well to receiving Narcan, initial blood pressure was low at 90/48 however quickly responded to Narcan and was greater than 100 systolic  the rest of the evening. Labs are unremarkable. Patient has some baseline chronic renal insufficiency likely contributing to her problems with polypharmacy which she's taking oral morphine.  Advised that the patient discontinue certain medications.    Patient less somnolent this morning able to ambulate with a cane. Patient typically leaves the hospital with a family member per nursing staff who know her well, will arrange arrive for home with family. Have discontinued some patient's medication including trazodone, meclizine and cyclobenzaprine. Patient also needs to cut down morphine likely.  Followup primary care physician in the next 2-3 days.          Jones Skene, MD 03/09/12 1610

## 2012-03-09 NOTE — ED Notes (Signed)
Pt alert, requesting discharge but doesn't have a ride home

## 2012-03-09 NOTE — ED Notes (Signed)
Pt was ambulated around nursing station with cane and one person assist.

## 2012-03-12 ENCOUNTER — Other Ambulatory Visit: Payer: Self-pay | Admitting: Family Medicine

## 2012-03-14 ENCOUNTER — Telehealth: Payer: Self-pay | Admitting: Family Medicine

## 2012-03-15 NOTE — Telephone Encounter (Signed)
Called and left message with mike to call back. Spoke with patient also

## 2012-03-18 ENCOUNTER — Other Ambulatory Visit: Payer: Self-pay | Admitting: Family Medicine

## 2012-03-18 ENCOUNTER — Telehealth: Payer: Self-pay | Admitting: Family Medicine

## 2012-03-18 DIAGNOSIS — G4730 Sleep apnea, unspecified: Secondary | ICD-10-CM

## 2012-03-18 NOTE — Telephone Encounter (Signed)
pls refer pt to Dr Fannie Knee in [place], pulmonary, eval of sleep apnea, referral is entered

## 2012-03-18 NOTE — Telephone Encounter (Signed)
Sent in error

## 2012-03-18 NOTE — Telephone Encounter (Signed)
Pt has confirmed with me that she does have her hospital bed

## 2012-03-18 NOTE — Telephone Encounter (Signed)
A copy of her sleep study was sent to [facility]. What else does she need to get CPAP machine. Keeps calling regarding this

## 2012-03-18 NOTE — Telephone Encounter (Signed)
I spoke directly with the pt, stated her current machine was20 y/o and she has not had a sleep study for approx 4 year. She will be referred to Dr Maple Hudson for eval for sleep apnea and management.  Please let [facility] know that the order for her CPAP and needed equipment will come from Dr Maple Hudson AFTER he has completed his eval. Pls ensure they also understand pt has not yet even seen the Doc so that they understand , no need to contact his office at this time

## 2012-03-18 NOTE — Telephone Encounter (Signed)
I spoke directly with the pt pls send in new script with dose increase in the medication and notify the pharmacy, script is entered historically, thanks

## 2012-03-18 NOTE — Telephone Encounter (Signed)
Runny nose and coughing, sinus congestion. Advised sudafed and robitussin or she could make appt for OV

## 2012-03-19 NOTE — Telephone Encounter (Signed)
Message sent to [facility]

## 2012-03-19 NOTE — Telephone Encounter (Signed)
Appointment with Dr Maple Hudson is 04/15/2012 at 2:30 patient is aware

## 2012-03-20 ENCOUNTER — Other Ambulatory Visit (HOSPITAL_COMMUNITY): Payer: Self-pay | Admitting: Neurosurgery

## 2012-03-20 DIAGNOSIS — D320 Benign neoplasm of cerebral meninges: Secondary | ICD-10-CM

## 2012-03-25 ENCOUNTER — Ambulatory Visit
Admission: RE | Admit: 2012-03-25 | Discharge: 2012-03-25 | Disposition: A | Discharge status: Home / Self Care | Payer: Medicare Other | Source: Ambulatory Visit | Attending: Family Medicine | Admitting: Family Medicine

## 2012-03-25 DIAGNOSIS — Z1231 Encounter for screening mammogram for malignant neoplasm of breast: Secondary | ICD-10-CM

## 2012-04-03 ENCOUNTER — Other Ambulatory Visit: Payer: Self-pay

## 2012-04-04 ENCOUNTER — Telehealth: Payer: Self-pay | Admitting: Family Medicine

## 2012-04-04 MED ORDER — DIPHENOXYLATE-ATROPINE 2.5-0.025 MG PO TABS: 1.0000 | ORAL_TABLET | Freq: Four times a day (QID) | ORAL | Status: DC | PRN

## 2012-04-05 ENCOUNTER — Other Ambulatory Visit: Payer: Self-pay | Admitting: Family Medicine

## 2012-04-05 ENCOUNTER — Telehealth: Payer: Self-pay | Admitting: Family Medicine

## 2012-04-05 ENCOUNTER — Other Ambulatory Visit: Payer: Self-pay | Admitting: Gastroenterology

## 2012-04-05 MED ORDER — FLUOCINOLONE ACETONIDE 0.01 % EX CREA: TOPICAL_CREAM | CUTANEOUS | Status: DC

## 2012-04-05 NOTE — Telephone Encounter (Signed)
Script entered historically pls fax after you spk with her

## 2012-04-05 NOTE — Telephone Encounter (Signed)
Med sent.

## 2012-04-05 NOTE — Telephone Encounter (Signed)
I see kenalog on her list. Is this the same thing? Ok to fill?

## 2012-04-05 NOTE — Telephone Encounter (Signed)
See previous message

## 2012-04-05 NOTE — Telephone Encounter (Signed)
Please call pharm as this was sent in yesterday. Thanks

## 2012-04-15 ENCOUNTER — Institutional Professional Consult (permissible substitution): Payer: Medicare Other | Admitting: Internal Medicine

## 2012-04-18 ENCOUNTER — Ambulatory Visit: Payer: Medicare Other | Admitting: Family Medicine

## 2012-04-18 ENCOUNTER — Encounter: Payer: Self-pay | Admitting: Family Medicine

## 2012-04-18 ENCOUNTER — Ambulatory Visit (INDEPENDENT_AMBULATORY_CARE_PROVIDER_SITE_OTHER): Payer: Medicare Other | Admitting: Family Medicine

## 2012-04-18 VITALS — BP 144/70 | HR 82 | Resp 18 | Ht 62.0 in | Wt 137.0 lb

## 2012-04-18 DIAGNOSIS — E119 Type 2 diabetes mellitus without complications: Secondary | ICD-10-CM

## 2012-04-18 DIAGNOSIS — R0789 Other chest pain: Secondary | ICD-10-CM

## 2012-04-18 DIAGNOSIS — J019 Acute sinusitis, unspecified: Secondary | ICD-10-CM

## 2012-04-18 DIAGNOSIS — R071 Chest pain on breathing: Secondary | ICD-10-CM

## 2012-04-18 DIAGNOSIS — J329 Chronic sinusitis, unspecified: Secondary | ICD-10-CM

## 2012-04-18 DIAGNOSIS — R079 Chest pain, unspecified: Secondary | ICD-10-CM

## 2012-04-18 DIAGNOSIS — I10 Essential (primary) hypertension: Secondary | ICD-10-CM

## 2012-04-18 DIAGNOSIS — L299 Pruritus, unspecified: Secondary | ICD-10-CM

## 2012-04-18 MED ORDER — AZITHROMYCIN 250 MG PO TABS: ORAL_TABLET | ORAL | Status: AC

## 2012-04-18 MED ORDER — NAPROXEN 375 MG PO TABS: 375.0000 mg | ORAL_TABLET | Freq: Two times a day (BID) | ORAL | Status: DC

## 2012-04-18 MED ORDER — HYDROXYZINE HCL 10 MG PO TABS: ORAL_TABLET | ORAL | Status: AC

## 2012-04-18 MED ORDER — KETOROLAC TROMETHAMINE 60 MG/2ML IJ SOLN
60.0000 mg | Freq: Once | INTRAMUSCULAR | Status: AC
  Administered 2012-04-18: 60 mg via INTRAMUSCULAR

## 2012-04-18 NOTE — Progress Notes (Signed)
  Subjective:    Patient ID: [name], female    DOB: Jul 10, 1950, 62 y.o.   MRN: [id number]  HPI 1 month h/o itching intensely over entire scalp, some areas have bumps, feels as if insides coming out.  Left rib pain x 2 week, also left chest wall pain worse with direct  pressure   Increaased nasal drainage brown had blood clots also intermittent chills , but no documneted fever, in the past 4 days   Review of Systems See HPI Denies recent fever  Denies  ear pain or sore throat. Denies chest congestion, productive cough or wheezing. Denies palpitations and leg swelling Denies abdominal pain, nausea, vomiting,diarrhea or constipation.   Denies dysuria, frequency, hesitancy or incontinence. Chronic  Back pain,  and limitation in mobility. Denies headaches, seizures, numbness, or tingling. Denies uncontrolled  Depression,does have increased  anxiety ansd  insomnia. Denies skin break down or rash.        Objective:   Physical Exam  Patient alert and oriented and in no cardiopulmonary distress.  HEENT: No facial asymmetry, EOMI, maxillary  sinus tenderness,  oropharynx pink and moist.  Neck supple no adenopathy.  Chest: Clear to auscultation bilaterally.Tender to palpation over 3rd and 4th left CC junctions  CVS: S1, S2 no murmurs, no S3.  ABD: Soft non tender. Bowel sounds normal.  Ext: No edema  MS: Decreased  ROM spine,adequate in  shoulders, hips and knees.  Skin: Intact, no ulcerations or rash noted.  Psych: Good eye contact, normal affect. Memory intact not anxious or depressed appearing.  CNS: CN 2-12 intact, power, tone and sensation normal throughout.       Assessment & Plan:

## 2012-04-18 NOTE — Patient Instructions (Addendum)
F/u as before.  Medication sent in for sinusitis, itching and chest wall pain  Toradol 60 mg Im today for chest wall pain

## 2012-04-20 NOTE — Assessment & Plan Note (Signed)
toradol administered in theoffice

## 2012-04-20 NOTE — Assessment & Plan Note (Signed)
Controlled, no change in medication  

## 2012-04-20 NOTE — Assessment & Plan Note (Signed)
updatedlab end of Feb last result showed over correction,

## 2012-04-20 NOTE — Assessment & Plan Note (Signed)
C/o generalized pruritus, likely related to uncontrolled anxiety, limited amt of med prescribed, till pt is seen by psych

## 2012-04-20 NOTE — Assessment & Plan Note (Signed)
Acute infection meds prescribed

## 2012-04-22 IMAGING — CR DG LUMBAR SPINE 2-3V
2 series · 2 of 2 positions shown · non-contrast
Comparison: 07/25/2010.

CLINICAL DATA: Back pain

LUMBAR SPINE - 2-3 VIEW

[view not recorded (1 of 2)]
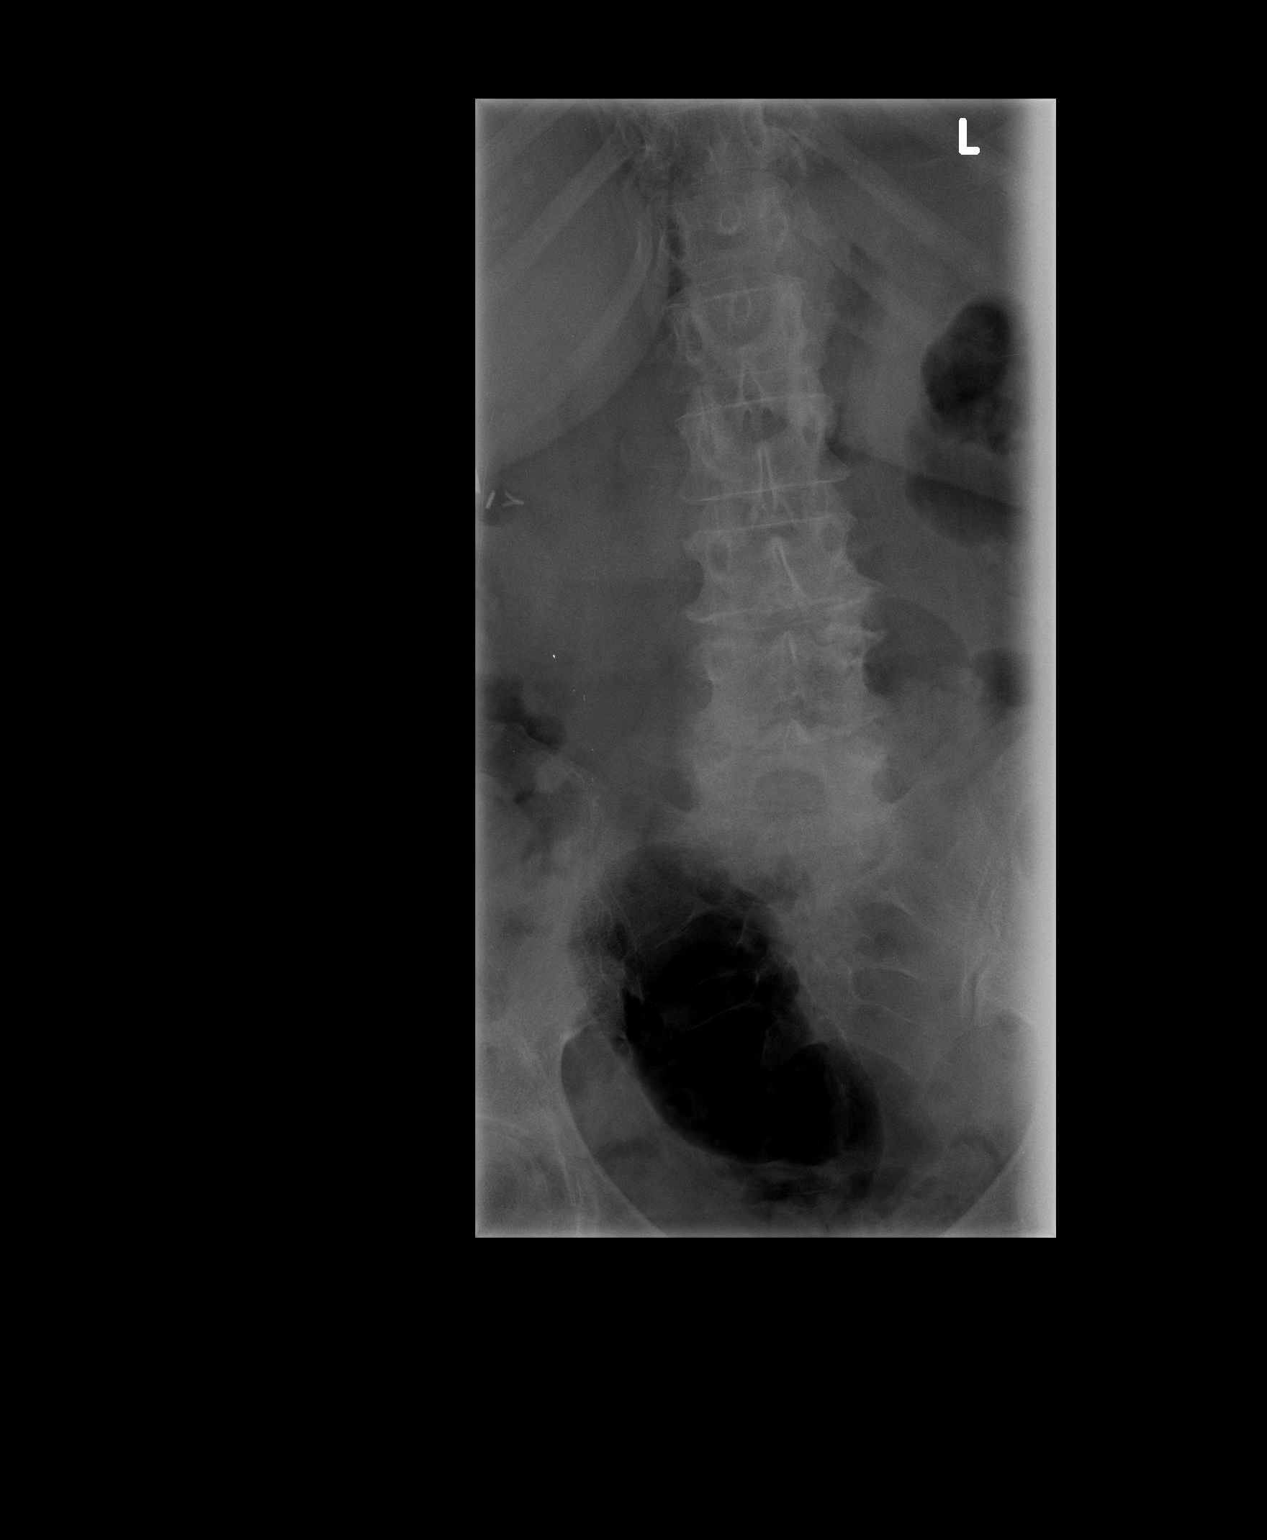

[view not recorded (2 of 2)]
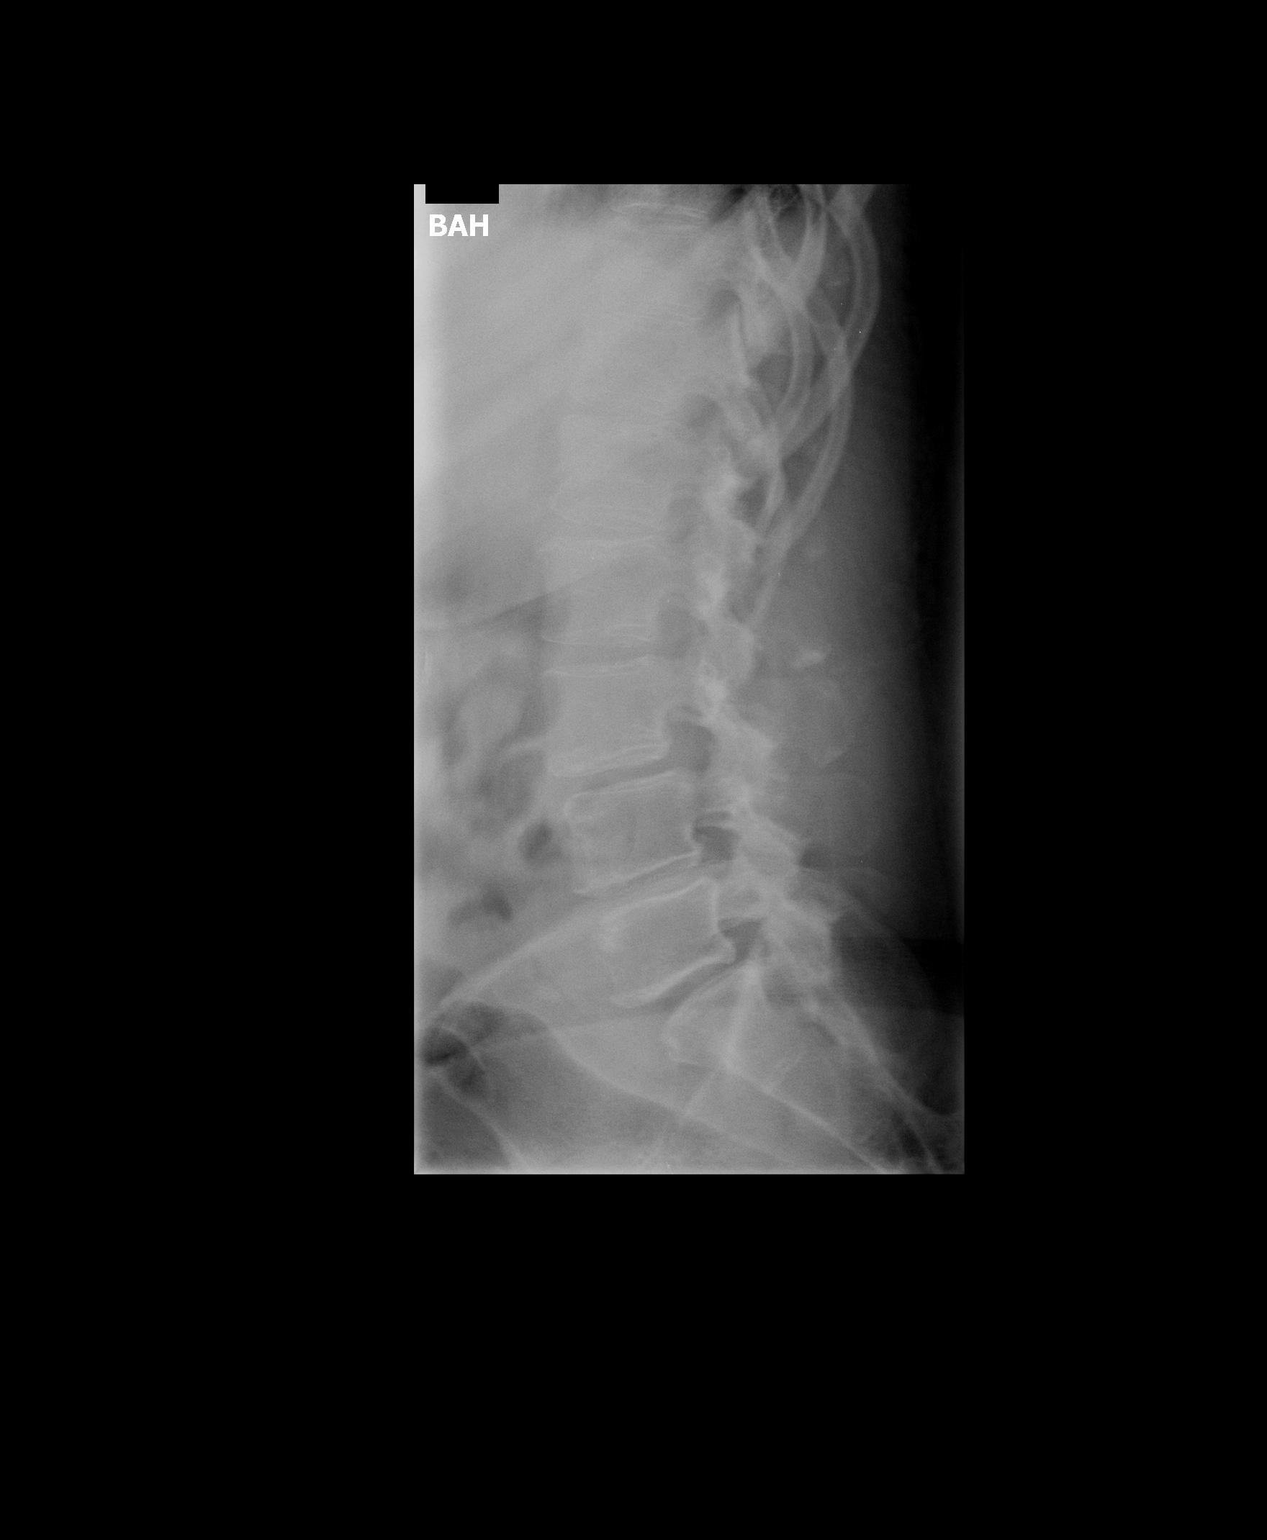

[2 of 2 positions shown; findings below may reference images not displayed]

FINDINGS: Five lumbar type vertebral bodies. Tiny rib  projects to
the left at L1.  Anatomic alignment except for minimal
anterolisthesis L5 on S1, and L3 on L4.  No change from prior MR.
IMPRESSION: As above.  Films are numbered in accordance with prior MR labeling.

## 2012-04-29 ENCOUNTER — Ambulatory Visit (HOSPITAL_COMMUNITY): Payer: Medicare Other

## 2012-04-30 ENCOUNTER — Other Ambulatory Visit: Payer: Self-pay | Admitting: Family Medicine

## 2012-05-01 ENCOUNTER — Ambulatory Visit (HOSPITAL_COMMUNITY)
Admission: RE | Admit: 2012-05-01 | Discharge: 2012-05-01 | Disposition: A | Discharge status: Home / Self Care | Payer: Medicare Other | Source: Ambulatory Visit | Attending: Neurosurgery | Admitting: Neurosurgery

## 2012-05-01 DIAGNOSIS — Z09 Encounter for follow-up examination after completed treatment for conditions other than malignant neoplasm: Secondary | ICD-10-CM | POA: Insufficient documentation

## 2012-05-01 DIAGNOSIS — D320 Benign neoplasm of cerebral meninges: Secondary | ICD-10-CM | POA: Insufficient documentation

## 2012-05-01 LAB — POCT I-STAT, CHEM 8
BUN: 31 mg/dL — ABNORMAL HIGH (ref 6–23)
Calcium, Ion: 1.24 mmol/L (ref 1.13–1.30)
Chloride: 110 mEq/L (ref 96–112)
Creatinine, Ser: 1.3 mg/dL — ABNORMAL HIGH (ref 0.50–1.10)
Glucose, Bld: 68 mg/dL — ABNORMAL LOW (ref 70–99)
HCT: 38 % (ref 36.0–46.0)
Hemoglobin: 12.9 g/dL (ref 12.0–15.0)
Potassium: 4.1 mEq/L (ref 3.5–5.1)
Sodium: 143 mEq/L (ref 135–145)
TCO2: 27 mmol/L (ref 0–100)

## 2012-05-01 MED ORDER — GADOBENATE DIMEGLUMINE 529 MG/ML IV SOLN
12.0000 mL | Freq: Once | INTRAVENOUS | Status: AC | PRN
  Administered 2012-05-01: 12 mL via INTRAVENOUS

## 2012-05-01 NOTE — Progress Notes (Signed)
IV started in left antecubital with 22g angiocath. Blood obtained for Creatnine level.  

## 2012-05-06 ENCOUNTER — Other Ambulatory Visit: Payer: Self-pay | Admitting: Family Medicine

## 2012-05-08 ENCOUNTER — Other Ambulatory Visit: Payer: Self-pay | Admitting: Family Medicine

## 2012-05-08 ENCOUNTER — Telehealth: Payer: Self-pay

## 2012-05-08 NOTE — Telephone Encounter (Signed)
Change entered historically pls let pt and pharmacist know and fax in

## 2012-05-08 NOTE — Telephone Encounter (Signed)
[facility] called and said that her fluocinolone 0.01% cream isn't covered and its very expensive. Wants to know if you want to change to triamcinolone or something similar?

## 2012-05-09 ENCOUNTER — Other Ambulatory Visit: Payer: Self-pay

## 2012-05-09 MED ORDER — TRIAMCINOLONE 0.1 % CREAM:EUCERIN CREAM 1:1: TOPICAL_CREAM | CUTANEOUS | Status: AC

## 2012-05-09 MED ORDER — ROSUVASTATIN CALCIUM 10 MG PO TABS: 10.0000 mg | ORAL_TABLET | Freq: Every day | ORAL | Status: DC

## 2012-05-09 NOTE — Telephone Encounter (Signed)
Sent in

## 2012-05-10 ENCOUNTER — Telehealth: Payer: Self-pay | Admitting: Family Medicine

## 2012-05-10 NOTE — Telephone Encounter (Signed)
Pharmacist called and said that one of her creams wasn't covered so it was changed to a new one and sent in and pharmacy aware

## 2012-05-15 ENCOUNTER — Other Ambulatory Visit: Payer: Self-pay

## 2012-05-15 MED ORDER — CLOPIDOGREL BISULFATE 75 MG PO TABS: 75.0000 mg | ORAL_TABLET | Freq: Every day | ORAL | Status: DC

## 2012-05-15 MED ORDER — CLONIDINE HCL 0.3 MG PO TABS: 0.3000 mg | ORAL_TABLET | Freq: Three times a day (TID) | ORAL | Status: DC

## 2012-05-21 ENCOUNTER — Telehealth: Payer: Self-pay | Admitting: Family Medicine

## 2012-05-21 DIAGNOSIS — E785 Hyperlipidemia, unspecified: Secondary | ICD-10-CM

## 2012-05-21 NOTE — Telephone Encounter (Signed)
Ordered and faxed over to the lab

## 2012-05-23 LAB — LIPID PANEL
Cholesterol: 100 mg/dL (ref 0–200)
HDL: 36 mg/dL — ABNORMAL LOW (ref >39)
Triglycerides: 179 mg/dL — ABNORMAL HIGH (ref <150)

## 2012-05-23 LAB — COMPLETE METABOLIC PANEL WITH GFR
BUN: 16 mg/dL (ref 6–23)
CO2: 31 mEq/L (ref 19–32)
Calcium: 9.4 mg/dL (ref 8.4–10.5)
Chloride: 106 mEq/L (ref 96–112)
Creat: 0.68 mg/dL (ref 0.50–1.10)
GFR, Est African American: >89 mL/min
GFR, Est Non African American: >89 mL/min

## 2012-05-29 ENCOUNTER — Other Ambulatory Visit: Payer: Self-pay | Admitting: Family Medicine

## 2012-05-29 ENCOUNTER — Telehealth: Payer: Self-pay | Admitting: Family Medicine

## 2012-05-29 NOTE — Telephone Encounter (Signed)
pls refer pt to dermatologist of her choice re itchy scalp, I am entering referral

## 2012-05-29 NOTE — Addendum Note (Signed)
Addended by: Abner Greenspan on: 05/29/2012 09:02 AM   Modules accepted: Orders

## 2012-05-30 NOTE — Telephone Encounter (Signed)
Pt has appt with dr. Ignacia Bayley for 07/02/2012 9:00

## 2012-05-31 ENCOUNTER — Telehealth: Payer: Self-pay | Admitting: Family Medicine

## 2012-05-31 ENCOUNTER — Other Ambulatory Visit: Payer: Self-pay | Admitting: Family Medicine

## 2012-05-31 DIAGNOSIS — E049 Nontoxic goiter, unspecified: Secondary | ICD-10-CM

## 2012-05-31 LAB — HEMOGLOBIN A1C
Hgb A1c MFr Bld: 6.1 % — ABNORMAL HIGH (ref <5.7)
Mean Plasma Glucose: 128 mg/dL — ABNORMAL HIGH (ref <117)

## 2012-05-31 NOTE — Telephone Encounter (Signed)
pls refer to Dr Fransico Him to f/u multinodular goiter, pt requests transfer to local md, pls let her know you have sent in referral I am sending most recent Ov from dr Kerrie Pleasure also pls send with request it is in your work area

## 2012-06-03 ENCOUNTER — Telehealth: Payer: Self-pay | Admitting: Family Medicine

## 2012-06-07 ENCOUNTER — Encounter: Payer: Self-pay | Admitting: Internal Medicine

## 2012-06-11 ENCOUNTER — Ambulatory Visit (INDEPENDENT_AMBULATORY_CARE_PROVIDER_SITE_OTHER): Payer: Medicare Other | Admitting: Gastroenterology

## 2012-06-11 ENCOUNTER — Other Ambulatory Visit: Payer: Self-pay | Admitting: Family Medicine

## 2012-06-11 ENCOUNTER — Encounter (HOSPITAL_COMMUNITY): Payer: Self-pay | Admitting: Emergency Medicine

## 2012-06-11 ENCOUNTER — Emergency Department (HOSPITAL_COMMUNITY): Payer: Medicare Other

## 2012-06-11 ENCOUNTER — Other Ambulatory Visit: Payer: Self-pay

## 2012-06-11 ENCOUNTER — Emergency Department (HOSPITAL_COMMUNITY)
Admission: EM | Admit: 2012-06-11 | Discharge: 2012-06-11 | Disposition: A | Discharge status: Home / Self Care | Payer: Medicare Other | Attending: Emergency Medicine | Admitting: Emergency Medicine

## 2012-06-11 ENCOUNTER — Encounter: Payer: Self-pay | Admitting: Gastroenterology

## 2012-06-11 VITALS — BP 85/53 | HR 64 | Temp 97.2°F | Ht 62.0 in | Wt 137.0 lb

## 2012-06-11 DIAGNOSIS — M129 Arthropathy, unspecified: Secondary | ICD-10-CM | POA: Insufficient documentation

## 2012-06-11 DIAGNOSIS — G8929 Other chronic pain: Secondary | ICD-10-CM | POA: Insufficient documentation

## 2012-06-11 DIAGNOSIS — R4182 Altered mental status, unspecified: Secondary | ICD-10-CM | POA: Insufficient documentation

## 2012-06-11 DIAGNOSIS — Z8669 Personal history of other diseases of the nervous system and sense organs: Secondary | ICD-10-CM | POA: Insufficient documentation

## 2012-06-11 DIAGNOSIS — F17200 Nicotine dependence, unspecified, uncomplicated: Secondary | ICD-10-CM | POA: Insufficient documentation

## 2012-06-11 DIAGNOSIS — F319 Bipolar disorder, unspecified: Secondary | ICD-10-CM | POA: Insufficient documentation

## 2012-06-11 DIAGNOSIS — Z5189 Encounter for other specified aftercare: Secondary | ICD-10-CM | POA: Insufficient documentation

## 2012-06-11 DIAGNOSIS — R404 Transient alteration of awareness: Secondary | ICD-10-CM

## 2012-06-11 DIAGNOSIS — E119 Type 2 diabetes mellitus without complications: Secondary | ICD-10-CM | POA: Insufficient documentation

## 2012-06-11 DIAGNOSIS — G43909 Migraine, unspecified, not intractable, without status migrainosus: Secondary | ICD-10-CM | POA: Insufficient documentation

## 2012-06-11 DIAGNOSIS — G4710 Hypersomnia, unspecified: Secondary | ICD-10-CM | POA: Insufficient documentation

## 2012-06-11 DIAGNOSIS — Z8719 Personal history of other diseases of the digestive system: Secondary | ICD-10-CM | POA: Insufficient documentation

## 2012-06-11 DIAGNOSIS — H409 Unspecified glaucoma: Secondary | ICD-10-CM | POA: Insufficient documentation

## 2012-06-11 DIAGNOSIS — K219 Gastro-esophageal reflux disease without esophagitis: Secondary | ICD-10-CM | POA: Insufficient documentation

## 2012-06-11 DIAGNOSIS — Z8739 Personal history of other diseases of the musculoskeletal system and connective tissue: Secondary | ICD-10-CM | POA: Insufficient documentation

## 2012-06-11 DIAGNOSIS — Z79899 Other long term (current) drug therapy: Secondary | ICD-10-CM | POA: Insufficient documentation

## 2012-06-11 DIAGNOSIS — G4730 Sleep apnea, unspecified: Secondary | ICD-10-CM | POA: Insufficient documentation

## 2012-06-11 DIAGNOSIS — M549 Dorsalgia, unspecified: Secondary | ICD-10-CM | POA: Insufficient documentation

## 2012-06-11 DIAGNOSIS — E039 Hypothyroidism, unspecified: Secondary | ICD-10-CM | POA: Insufficient documentation

## 2012-06-11 DIAGNOSIS — G40909 Epilepsy, unspecified, not intractable, without status epilepticus: Secondary | ICD-10-CM | POA: Insufficient documentation

## 2012-06-11 DIAGNOSIS — Z9889 Other specified postprocedural states: Secondary | ICD-10-CM | POA: Insufficient documentation

## 2012-06-11 DIAGNOSIS — M810 Age-related osteoporosis without current pathological fracture: Secondary | ICD-10-CM | POA: Insufficient documentation

## 2012-06-11 DIAGNOSIS — I10 Essential (primary) hypertension: Secondary | ICD-10-CM | POA: Insufficient documentation

## 2012-06-11 DIAGNOSIS — F411 Generalized anxiety disorder: Secondary | ICD-10-CM | POA: Insufficient documentation

## 2012-06-11 DIAGNOSIS — Z8673 Personal history of transient ischemic attack (TIA), and cerebral infarction without residual deficits: Secondary | ICD-10-CM | POA: Insufficient documentation

## 2012-06-11 DIAGNOSIS — Z862 Personal history of diseases of the blood and blood-forming organs and certain disorders involving the immune mechanism: Secondary | ICD-10-CM | POA: Insufficient documentation

## 2012-06-11 LAB — CBC WITH DIFFERENTIAL/PLATELET
Basophils Absolute: 0 10*3/uL (ref 0.0–0.1)
Basophils Relative: 0 % (ref 0–1)
Eosinophils Absolute: 0.2 10*3/uL (ref 0.0–0.7)
Hemoglobin: 9.8 g/dL — ABNORMAL LOW (ref 12.0–15.0)
MCH: 22.5 pg — ABNORMAL LOW (ref 26.0–34.0)
MCHC: 31.3 g/dL (ref 30.0–36.0)
Neutro Abs: 3.3 10*3/uL (ref 1.7–7.7)
Neutrophils Relative %: 47 % (ref 43–77)
Platelets: 168 10*3/uL (ref 150–400)
RDW: 14.8 % (ref 11.5–15.5)

## 2012-06-11 LAB — RAPID URINE DRUG SCREEN, HOSP PERFORMED
Barbiturates: NOT DETECTED
Cocaine: NOT DETECTED
Opiates: POSITIVE — AB
Tetrahydrocannabinol: NOT DETECTED

## 2012-06-11 LAB — BASIC METABOLIC PANEL
Calcium: 9 mg/dL (ref 8.4–10.5)
GFR calc Af Amer: >90 mL/min (ref >90)
GFR calc non Af Amer: 90 mL/min — ABNORMAL LOW (ref >90)
Potassium: 3.4 mEq/L — ABNORMAL LOW (ref 3.5–5.1)
Sodium: 141 mEq/L (ref 135–145)

## 2012-06-11 LAB — ETHANOL: Alcohol, Ethyl (B): <11 mg/dL (ref 0–11)

## 2012-06-11 LAB — LACTIC ACID, PLASMA: Lactic Acid, Venous: 1.4 mmol/L (ref 0.5–2.2)

## 2012-06-11 LAB — URINALYSIS, ROUTINE W REFLEX MICROSCOPIC
Bilirubin Urine: NEGATIVE
Hgb urine dipstick: NEGATIVE
Nitrite: NEGATIVE
Protein, ur: NEGATIVE mg/dL
Specific Gravity, Urine: 1.01 (ref 1.005–1.030)
Urobilinogen, UA: 0.2 mg/dL (ref 0.0–1.0)

## 2012-06-11 LAB — GLUCOSE, CAPILLARY: Glucose-Capillary: 86 mg/dL (ref 70–99)

## 2012-06-11 MED ORDER — SODIUM CHLORIDE 0.9 % IV SOLN
1000.0000 mL | Freq: Once | INTRAVENOUS | Status: AC
  Administered 2012-06-11: 1000 mL via INTRAVENOUS

## 2012-06-11 MED ORDER — THIAMINE HCL 100 MG/ML IJ SOLN
100.0000 mg | Freq: Every day | INTRAMUSCULAR | Status: DC
  Administered 2012-06-11: 100 mg via INTRAVENOUS
  Filled 2012-06-11: qty 2

## 2012-06-11 MED ORDER — SODIUM CHLORIDE 0.9 % IV SOLN: 1000.0000 mL | INTRAVENOUS | Status: DC

## 2012-06-11 NOTE — Discharge Instructions (Signed)
See your doctor, in one week, for a checkup. See your doctor to evaluate your medication list; to check for interactions that might be contributing to your sleepiness. Get plenty of rest and try to eat 3 regular meals each day.     Altered Mental Status Altered mental status most often refers to an abnormal change in your responsiveness and awareness. It can affect your speech, thought, mobility, memory, attention span, or alertness. It can range from slight confusion to complete unresponsiveness (coma). Altered mental status can be a sign of a serious underlying medical condition. Rapid evaluation and medical treatment is necessary for patients having an altered mental status. CAUSES   Low blood sugar (hypoglycemia) or diabetes.  Severe loss of body fluids (dehydration) or a body salt (electrolyte) imbalance.  A stroke or other neurologic problem, such as dementia or delirium.  A head injury or tumor.  A drug or alcohol overdose.  Exposure to toxins or poisons.  Depression, anxiety, and stress.  A low oxygen level (hypoxia).  An infection.  Blood loss.  Twitching or shaking (seizure).  Heart problems, such as heart attack or heart rhythm problems (arrhythmias).  A body temperature that is too low or too high (hypothermia or hyperthermia). DIAGNOSIS  A diagnosis is based on your history, symptoms, physical and neurologic examinations, and diagnostic tests. Diagnostic tests may include:  Measurement of your blood pressure, pulse, breathing, and oxygen levels (vital signs).  Blood tests.  Urine tests.  X-ray exams.  A computerized magnetic scan (magnetic resonance imaging, MRI).  A computerized X-ray scan (computed tomography, CT scan). TREATMENT  Treatment will depend on the cause. Treatment may include:  Management of an underlying medical or mental health condition.  Critical care or support in the hospital. HOME CARE INSTRUCTIONS   Only take  over-the-counter or prescription medicines for pain, discomfort, or fever as directed by your caregiver.  Manage underlying conditions as directed by your caregiver.  Eat a healthy, well-balanced diet to maintain strength.  Join a support group or prevention program to cope with the condition or trauma that caused the altered mental status. Ask your caregiver to help choose a program that works for you.  Follow up with your caregiver for further examination, therapy, or testing as directed. SEEK MEDICAL CARE IF:   You feel unwell or have chills.  You or your family notice a change in your behavior or your alertness.  You have trouble following your caregiver's treatment plan.  You have questions or concerns. SEEK IMMEDIATE MEDICAL CARE IF:   You have a rapid heartbeat or have chest pain.  You have difficulty breathing.  You have a fever.  You have a headache with a stiff neck.  You cough up blood.  You have blood in your urine or stool.  You have severe agitation or confusion. MAKE SURE YOU:   Understand these instructions.  Will watch your condition.  Will get help right away if you are not doing well or get worse. Document Released: 08/17/2009 Document Revised: 05/22/2011 Document Reviewed: 08/17/2009 [facility] Patient Information 2013 [place], [place].

## 2012-06-11 NOTE — ED Notes (Signed)
Patient arrives via EMS from [facility] with c/o AMS. Patient arrived at their office per staff in this condition off the bus. Patient is lethargic, pupils dilated and sluggish. Response to stimuli. States this happens sometimes where she gets tired. Patient is very persistent that she did not take too many narcotics or benzos in which she is prescribed.

## 2012-06-11 NOTE — ED Provider Notes (Signed)
History     This chart was scribed for Flint Melter, MD, MD by Smitty Pluck, ED Scribe. The patient was seen in room APA05/APA05 and the patient's care was started at 3:26 PM.   CSN: [id number]  Arrival date & time 06/11/12  1504       Chief Complaint  Patient presents with  . Altered Mental Status   Pt is level 5 caveat for Altered mental status.   The history is provided by the EMS personnel and medical records. No language interpreter was used.   [name] is a 62 y.o. female who presents to the Emergency Department BIB EMS from Dr. Jena Gauss (GI) for further evaluation for altered mental status. Per EMS staff report pt was lethargic and sluggish. Staff state this happens sometimes when pt gets tired.   Past Medical History  Diagnosis Date  . Bipolar disorder   . CVA (cerebral infarction)   . Pancreatitis 2006    due to Depakote with normal EUS   . Osteoporosis   . Chronic back pain   . Diabetes mellitus   . Trigger finger   . Anxiety disorder   . Hypertension   . Migraines   . Diverticulosis     TCS 9/08 by Dr. Lina Sar for diarrhea . Bx for micro scopic colitis negative.   . Schatzki's ring     non critical / EGD with ED 8/2011with RMR  . S/P colonoscopy 5284,1324, 2011    left-sided diverticula, hx of simple adenomas . 2011, random bx negative for microscopic colitis  . Glaucoma(365)   . Allergic rhinitis   . Hypothyroidism     thyroid goiter  . Anemia   . Blood transfusion   . GERD (gastroesophageal reflux disease)   . Stroke     left sided weakness  . Seizures     unknown etiology-on meds-last seizure was 3 yerars ago  . Anxiety   . Depression   . Metabolic encephalopathy 08/03/2011  . Sleep apnea   . Arthritis   . Gum symptoms     infection on antibiotic    Past Surgical History  Procedure Laterality Date  . Abdominal hysterectomy    . Cholecystectomy    . Ovarian cyst removal    . Carpal tunnel release  07/22/04    left/ Dr. Romeo Apple   .  Breast reduction surgery    . Bilateral cataract surgery    . Biopsy of thyroid gland  2009  . Surgical excision of 3 tumors from right thigh and right buttock  and left upper thigh  2010  . Back surgery  July 2012  . Spine surgery  09/29/2010    dr brooks  . Maloney dilation  12/29/2010    RMR;  . Esophagogastroduodenoscopy  12/29/2010    Rourk-Retained food in the esophagus and stomach, small hiatal hernia, status post Maloney dilation of the esophagus  . Craniotomy  11/23/2011    Procedure: CRANIOTOMY TUMOR EXCISION;  Surgeon: Hewitt Shorts, MD;  Location: [facility];  Service: Neurosurgery;  Laterality: N/A;  Craniotomy for tumor resection  . Brain surgery  11/2011    resection of meningioma    Family History  Problem Relation Age of Onset  . Heart attack Mother   . Pneumonia Father   . Kidney failure Father   . Cancer Sister     pancreatic  . Diabetes Brother   . Hypertension Brother   . Colon cancer Neg Hx   .  Anesthesia problems Neg Hx   . Hypotension Neg Hx   . Malignant hyperthermia Neg Hx   . Pseudochol deficiency Neg Hx     History  Substance Use Topics  . Smoking status: Current Every Day Smoker -- 0.25 packs/day for 7 years    Types: Cigarettes  . Smokeless tobacco: Never Used     Comment: started at 24 smoked 6 yrs and stopped. restarted 2 months ago  . Alcohol Use: No     Comment: 5 cigs/day     OB History   Grav Para Term Preterm Abortions TAB SAB Ect Mult Living                  Review of Systems  Unable to perform ROS: Mental status change    Allergies  Cephalexin; Milk-related compounds; Penicillins; and Phenazopyridine hcl  Home Medications   Current Outpatient Rx  Name  Route  Sig  Dispense  Refill  . clotrimazole-betamethasone (LOTRISONE) cream   Topical   Apply 1 application topically daily as needed.         . hydrochlorothiazide (HYDRODIURIL) 12.5 MG tablet   Oral   Take 12.5 mg by mouth daily.         . metFORMIN  (GLUCOPHAGE) 1000 MG tablet   Oral   Take 1,000 mg by mouth 2 (two) times daily with a meal.         . montelukast (SINGULAIR) 10 MG tablet   Oral   Take 10 mg by mouth at bedtime.         . Vitamin D, Ergocalciferol, (DRISDOL) 50000 UNITS CAPS   Oral   Take 50,000 Units by mouth once a week.         [name] acetaminophen (TYLENOL) 325 MG tablet   Oral   Take 1-2 tablets (325-650 mg total) by mouth every 4 (four) hours as needed for pain.   1 tablet   0   . cloNIDine (CATAPRES) 0.3 MG tablet   Oral   Take 1 tablet (0.3 mg total) by mouth 3 (three) times daily. Patient takes at 8am,4pm,and midnight   90 tablet   3   . clopidogrel (PLAVIX) 75 MG tablet   Oral   Take 1 tablet (75 mg total) by mouth daily.   30 tablet   3   . cycloSPORINE (RESTASIS) 0.05 % ophthalmic emulsion      1 drop 2 (two) times daily.         . diclofenac sodium (VOLTAREN) 1 % GEL      Apply twice daily, as needed to leftleg for pain   100 g   1   . diphenoxylate-atropine (LOMOTIL) 2.5-0.025 MG per tablet   Oral   Take 1 tablet by mouth 4 (four) times daily as needed for diarrhea or loose stools. For loose stools   30 tablet   1   . fluticasone (FLONASE) 50 MCG/ACT nasal spray   Nasal   Place 1 spray into the nose daily. Allergies   16 g   3   . GLIPIZIDE XL 5 MG 24 hr tablet               . hydrOXYzine (ATARAX/VISTARIL) 10 MG tablet               . lamoTRIgine (LAMICTAL) 100 MG tablet   Oral   Take 1 tablet (100 mg total) by mouth every morning.   30 tablet   2   .  levothyroxine (LEVOXYL) 50 MCG tablet   Oral   Take 50 mcg by mouth every morning.          [name] LORazepam (ATIVAN) 1 MG tablet   Oral   Take 0.5 tablets (0.5 mg total) by mouth every 8 (eight) hours as needed for anxiety.   30 tablet      . methocarbamol (ROBAXIN) 500 MG tablet               . metoprolol (LOPRESSOR) 50 MG tablet   Oral   Take 50 mg by mouth 2 (two) times daily.         .  mirtazapine (REMERON) 30 MG tablet   Oral   Take 30 mg by mouth at bedtime.          [name] morphine (MSIR) 15 MG tablet   Oral   Take 15 mg by mouth 2 (two) times daily as needed. For pain         . naproxen (NAPROSYN) 375 MG tablet   Oral   Take 1 tablet (375 mg total) by mouth 2 (two) times daily with a meal.   10 tablet   0   . nystatin (MYCOSTATIN) powder   Topical   Apply topically 2 (two) times daily.         . ondansetron (ZOFRAN) 4 MG tablet   Oral   Take 4 mg by mouth every 8 (eight) hours as needed. nausea         . ONGLYZA 5 MG TABS tablet   Oral   Take 5 mg by mouth Daily.         . pregabalin (LYRICA) 75 MG capsule   Oral   Take 1 capsule (75 mg total) by mouth 2 (two) times daily.   60 capsule   2   . RABEprazole (ACIPHEX) 20 MG tablet   Oral   Take 1 tablet (20 mg total) by mouth daily.   31 tablet   11   . rosuvastatin (CRESTOR) 10 MG tablet   Oral   Take 1 tablet (10 mg total) by mouth daily.   30 tablet   2   . sertraline (ZOLOFT) 100 MG tablet   Oral   Take 150 mg by mouth at bedtime.          [name] thioridazine (MELLARIL) 10 MG tablet               . thiothixene (NAVANE) 2 MG capsule   Oral   Take 2 mg by mouth at bedtime.          . traZODone (DESYREL) 50 MG tablet               . Triamcinolone Acetonide (TRIAMCINOLONE 0.1 % CREAM : EUCERIN) CREA      Apply twice daily to affected area(s) for rash and itch   1 each   0     BP 110/56  Pulse 58  Temp(Src) 98.3 F (36.8 C) (Oral)  Wt 134 lb (60.782 kg)  BMI 24.5 kg/m2  SpO2 100%  Physical Exam  Nursing note and vitals reviewed. Constitutional: She appears well-developed and well-nourished.  Cooperative  HENT:  Head: Normocephalic and atraumatic.  Mouth/Throat: Mucous membranes are dry.  Eyes: Conjunctivae and EOM are normal. Pupils are equal, round, and reactive to light.  Neck: Normal range of motion and phonation normal. Neck supple.  Cardiovascular:  Normal rate, regular rhythm and intact distal pulses.   Pulmonary/Chest: Effort  normal and breath sounds normal. She exhibits no tenderness.  Abdominal: Soft. She exhibits no distension. There is no tenderness. There is no guarding.  Musculoskeletal: Normal range of motion.  Neurological: She is alert. She has normal strength. She exhibits normal muscle tone.  Nl arm and leg strength Follows commands No pronator drift   Skin: Skin is warm and dry.  Psychiatric: She has a normal mood and affect. Her behavior is normal. Judgment and thought content normal.    ED Course  Procedures (including critical care time) DIAGNOSTIC STUDIES: Oxygen Saturation is 100% on room air, normal by my interpretation.    COORDINATION OF CARE: 3:29 PM Discussed ED treatment with pt. Will order chest xray and CT head.  3:31 PM Ordered:  Medications  0.9 %  sodium chloride infusion (0 mLs Intravenous Stopped 06/11/12 1635)    Followed by  0.9 %  sodium chloride infusion (1,000 mLs Intravenous New Bag/Given 06/11/12 1635)    Followed by  0.9 %  sodium chloride infusion (not administered)  thiamine (B-1) injection 100 mg (100 mg Intravenous Given 06/11/12 1547)   Reevaluation: 19:35- at this time, the patient is alert, responsive, and much different than when first seen. She denied blood in rectum, or stool abnormalities. A rectal exam was done with chaperone present. She has normal anal sphincter tone. Small amount of brown stool in the rectum. Hemoccult testing was negative. There are no internal or external hemorrhoids. The patient states that she sometimes "gets sleepy just like this".   Filed Vitals:   06/11/12 1512 06/11/12 1800 06/11/12 1958  BP: 110/56 160/77 125/47  Pulse: 58 57 66  Temp: 98.3 F (36.8 C)    TempSrc: Oral    Resp:  27 20  Weight: 134 lb (60.782 kg)    SpO2: 100% 100% 100%   .     Date: 12/29/2011  Rate: 58  Rhythm: normal sinus rhythm  QRS Axis: normal  PR and QT Intervals:  normal  ST/T Wave abnormalities: normal  PR and QRS Conduction Disutrbances:none  Narrative Interpretation:   Old EKG Reviewed: unchanged- 03/09/12  CRITICAL CARE Performed by: Mancel Bale L   Total critical care time: 40 minutes  Critical care time was exclusive of separately billable procedures and treating other patients.  Critical care was necessary to treat or prevent imminent or life-threatening deterioration.  Critical care was time spent personally by me on the following activities: development of treatment plan with patient and/or surrogate as well as nursing, discussions with consultants, evaluation of patient's response to treatment, examination of patient, obtaining history from patient or surrogate, ordering and performing treatments and interventions, ordering and review of laboratory studies, ordering and review of radiographic studies, pulse oximetry and re-evaluation of patient's condition.   Labs Reviewed  CBC WITH DIFFERENTIAL - Abnormal; Notable for the following:    Hemoglobin 9.8 (*)    HCT 31.3 (*)    MCV 72.0 (*)    MCH 22.5 (*)    All other components within normal limits  BASIC METABOLIC PANEL - Abnormal; Notable for the following:    Potassium 3.4 (*)    BUN 24 (*)    GFR calc non Af Amer 90 (*)    All other components within normal limits  URINALYSIS, ROUTINE W REFLEX MICROSCOPIC - Abnormal; Notable for the following:    APPearance HAZY (*)    All other components within normal limits  URINE RAPID DRUG SCREEN (HOSP PERFORMED) - Abnormal; Notable for the following:  Opiates POSITIVE (*)    All other components within normal limits  URINE CULTURE  AMMONIA  ETHANOL  LACTIC ACID, PLASMA  GLUCOSE, CAPILLARY  OCCULT BLOOD, POC DEVICE   Dg Chest 1 View  06/11/2012  *RADIOLOGY REPORT*  Clinical Data: Altered mental status, confusion  CHEST - 1 VIEW  Comparison: 02/15/2012  Findings: Low lung volumes with bibasilar opacities, likely atelectasis.   Increasing opacity in the left hemithorax likely reflects overlying soft tissue with patient rotation.  No pleural effusion or pneumothorax.  Mild cardiomegaly.  IMPRESSION: No evidence of acute cardiopulmonary disease.   Original Report Authenticated By: Charline Bills, M.D.    Ct Head Wo Contrast  06/11/2012  *RADIOLOGY REPORT*  Clinical Data: Altered mental status  CT HEAD WITHOUT CONTRAST  Technique:  Contiguous axial images were obtained from the base of the skull through the vertex without contrast.  Comparison: March 09, 2012.  Findings: Postoperative changes involving the frontal skull are again noted and unchanged compared to prior exam.  No acute abnormalities seen involving the bony calvarium.  Postoperative changes are seen involving the both frontal lobes with surgical clips present.  There is no mass effect or midline shift. Ventricular size is within normal limits.  No extra-axial fluid collection is noted.  There is no evidence of mass lesion, hemorrhage or acute infarction.  IMPRESSION: Postoperative changes seen involving the frontal skull and lobes as described above.  These are unchanged compared to prior exam.  No acute intracranial abnormality is seen.   Original Report Authenticated By: Lupita Raider.,  M.D.    Nursing Notes Reviewed/ Care Coordinated Applicable Imaging Reviewed Interpretation of Laboratory Data incorporated into ED treatment  1. Altered mental status       MDM  Transient altered mental status. Skin evaluation is negative for acute metabolic, infectious, or intracerebral abnormalities. Her lethargy and altered mental status, is likely multifactorial. Her anemia is likely due to chronic illness. She has a low MCV. There is no apparent rectal bleeding. Doubt metabolic instability, serious bacterial infection or impending vascular collapse; the patient is stable for discharge.  Plan: Home Medications- usual; Home Treatments- rest; Recommended follow up- PCP  for checkup in 1 week    I personally performed the services described in this documentation, which was scribed in my presence. The recorded information has been reviewed and is accurate.     Flint Melter, MD 06/11/12 2005

## 2012-06-11 NOTE — Assessment & Plan Note (Signed)
62 year old female presenting for routine follow-up; upon entering the room, she is slumped in the chair and very difficult to rouse for more than a few seconds. Hypotensive, mildly bradycardic. Shallow respirations, diaphoretic. Called 911 and stayed with patient until EMS arrived. Blood sugar within normal range. Likely dealing with drug effect. Would strongly advise patient not live alone. Will reschedule patient when she is stable. Patient left with EMS.

## 2012-06-11 NOTE — Progress Notes (Signed)
Referring Provider: Kerri Perches, MD Primary Care Physician:  Syliva Overman, MD Primary Gastroenterologist: Dr. Jena Gauss   Chief Complaint  Patient presents with  . Abdominal Pain  . Weight Loss    HPI:   [name] is a 62 year old female presenting today in follow-up with a history of gastroparesis, GERD, IBS, chronic anemia, dysphagia, chronic abdominal pain. Appears she was last seen in May 2013 with complaints of dysphagia. Last EGD in October 2012 with Dr. Jena Gauss noted small hiatal hernia, s/p empiric dilation.  BPE May 2013 noted prominent tertiary contractions with swallowing, no stricture or hold-up of tablet.   Last visit weighed 154. Now 137.    Upon entering room, she is lethargic and slumped in chair. I was unable to rouse her for more than a few seconds at a time. She was unable to carry on a conversation with me. BP 85/58. Pulse 61. She is a diabetic. Unable to check blood sugar in this office. Appears diaphoretic. Very slow speech. Notes lower abdominal pain. Shallow respirations, hypotensive.  Ambulance called.  Past Medical History  Diagnosis Date  . Bipolar disorder   . CVA (cerebral infarction)   . Pancreatitis 2006    due to Depakote with normal EUS   . Osteoporosis   . Chronic back pain   . Diabetes mellitus   . Trigger finger   . Anxiety disorder   . Hypertension   . Migraines   . Diverticulosis     TCS 9/08 by Dr. Lina Sar for diarrhea . Bx for micro scopic colitis negative.   . Schatzki's ring     non critical / EGD with ED 8/2011with RMR  . S/P colonoscopy 1610,9604, 2011    left-sided diverticula, hx of simple adenomas . 2011, random bx negative for microscopic colitis  . Glaucoma(365)   . Allergic rhinitis   . Hypothyroidism     thyroid goiter  . Anemia   . Blood transfusion   . GERD (gastroesophageal reflux disease)   . Stroke     left sided weakness  . Seizures     unknown etiology-on meds-last seizure was 3 yerars ago  .  Anxiety   . Depression   . Metabolic encephalopathy 08/03/2011  . Sleep apnea   . Arthritis   . Gum symptoms     infection on antibiotic    Past Surgical History  Procedure Laterality Date  . Abdominal hysterectomy    . Cholecystectomy    . Ovarian cyst removal    . Carpal tunnel release  07/22/04    left/ Dr. Romeo Apple   . Breast reduction surgery    . Bilateral cataract surgery    . Biopsy of thyroid gland  2009  . Surgical excision of 3 tumors from right thigh and right buttock  and left upper thigh  2010  . Back surgery  July 2012  . Spine surgery  09/29/2010    dr brooks  . Maloney dilation  12/29/2010    RMR;  . Esophagogastroduodenoscopy  12/29/2010    Rourk-Retained food in the esophagus and stomach, small hiatal hernia, status post Maloney dilation of the esophagus  . Craniotomy  11/23/2011    Procedure: CRANIOTOMY TUMOR EXCISION;  Surgeon: Hewitt Shorts, MD;  Location: [facility];  Service: Neurosurgery;  Laterality: N/A;  Craniotomy for tumor resection  . Brain surgery  11/2011    resection of meningioma    Current Outpatient Prescriptions  Medication Sig Dispense Refill  . acetaminophen (  TYLENOL) 325 MG tablet Take 1-2 tablets (325-650 mg total) by mouth every 4 (four) hours as needed for pain.  1 tablet  0  . cloNIDine (CATAPRES) 0.3 MG tablet Take 1 tablet (0.3 mg total) by mouth 3 (three) times daily. Patient takes at 8am,4pm,and midnight  90 tablet  3  . clopidogrel (PLAVIX) 75 MG tablet Take 1 tablet (75 mg total) by mouth daily.  30 tablet  3  . clotrimazole-betamethasone (LOTRISONE) cream APPLY TO AFFECTED AREA(S) TWICE DAILY FOR 2 WEEKS;THEN CONTINUE AS NEEDED.  45 g  0  . cycloSPORINE (RESTASIS) 0.05 % ophthalmic emulsion 1 drop 2 (two) times daily.      . diclofenac sodium (VOLTAREN) 1 % GEL Apply twice daily, as needed to leftleg for pain  100 g  1  . diphenoxylate-atropine (LOMOTIL) 2.5-0.025 MG per tablet Take 1 tablet by mouth 4 (four) times daily  as needed for diarrhea or loose stools. For loose stools  30 tablet  1  . fluticasone (FLONASE) 50 MCG/ACT nasal spray Place 1 spray into the nose daily. Allergies  16 g  3  . GLUCOPHAGE 1000 MG tablet TAKE 1 TABLET BY MOUTH TWICE DAILY WITH FOOD.  60 tablet  2  . hydrochlorothiazide (MICROZIDE) 12.5 MG capsule TAKE ONE CAPSULE DAILY.  30 capsule  3  . lamoTRIgine (LAMICTAL) 100 MG tablet Take 1 tablet (100 mg total) by mouth every morning.  30 tablet  2  . levothyroxine (LEVOXYL) 50 MCG tablet Take 50 mcg by mouth every morning.       [name] LORazepam (ATIVAN) 1 MG tablet Take 0.5 tablets (0.5 mg total) by mouth every 8 (eight) hours as needed for anxiety.  30 tablet    . metoprolol (LOPRESSOR) 50 MG tablet Take 50 mg by mouth 2 (two) times daily.      . mirtazapine (REMERON) 30 MG tablet Take 30 mg by mouth at bedtime.       . montelukast (SINGULAIR) 10 MG tablet TAKE ONE TABLET DAILY.  30 tablet  3  . morphine (MSIR) 15 MG tablet Take 15 mg by mouth 2 (two) times daily as needed. For pain      . naproxen (NAPROSYN) 375 MG tablet Take 1 tablet (375 mg total) by mouth 2 (two) times daily with a meal.  10 tablet  0  . nystatin (MYCOSTATIN) powder Apply topically 2 (two) times daily.      . ondansetron (ZOFRAN) 4 MG tablet Take 4 mg by mouth every 8 (eight) hours as needed. nausea      . ONGLYZA 5 MG TABS tablet Take 5 mg by mouth Daily.      . pregabalin (LYRICA) 75 MG capsule Take 1 capsule (75 mg total) by mouth 2 (two) times daily.  60 capsule  2  . RABEprazole (ACIPHEX) 20 MG tablet Take 1 tablet (20 mg total) by mouth daily.  31 tablet  11  . rosuvastatin (CRESTOR) 10 MG tablet Take 1 tablet (10 mg total) by mouth daily.  30 tablet  2  . sertraline (ZOLOFT) 100 MG tablet Take 150 mg by mouth at bedtime.       [name] thiothixene (NAVANE) 2 MG capsule Take 2 mg by mouth at bedtime.       . Triamcinolone Acetonide (TRIAMCINOLONE 0.1 % CREAM : EUCERIN) CREA Apply twice daily to affected area(s) for rash  and itch  1 each  0  . Vitamin D, Ergocalciferol, (DRISDOL) 50000 UNITS CAPS TAKE 1 CAPSULE  BY MOUTH ONCE WEEKLY.  4 capsule  2   No current facility-administered medications for this visit.    Allergies as of 06/11/2012 - Review Complete 06/11/2012  Allergen Reaction Noted  . Cephalexin Hives   . Milk-related compounds Other (See Comments) 07/29/2008  . Penicillins Hives   . Phenazopyridine hcl Other (See Comments) 06/19/2007    Family History  Problem Relation Age of Onset  . Heart attack Mother   . Pneumonia Father   . Kidney failure Father   . Cancer Sister     pancreatic  . Diabetes Brother   . Hypertension Brother   . Colon cancer Neg Hx   . Anesthesia problems Neg Hx   . Hypotension Neg Hx   . Malignant hyperthermia Neg Hx   . Pseudochol deficiency Neg Hx     History   Social History  . Marital Status: Divorced    Spouse Name: N/A    Number of Children: 1  . Years of Education: N/A   Occupational History  . disabled     Social History Main Topics  . Smoking status: Current Every Day Smoker -- 0.25 packs/day for 7 years    Types: Cigarettes  . Smokeless tobacco: Never Used     Comment: started at 24 smoked 6 yrs and stopped. restarted 2 months ago  . Alcohol Use: No     Comment: 5 cigs/day   . Drug Use: No  . Sexually Active: No   Other Topics Concern  . None   Social History Narrative  . None

## 2012-06-11 NOTE — ED Notes (Signed)
Patient up to bedside commode, tolerated well. 

## 2012-06-11 NOTE — Progress Notes (Signed)
CC PCP 

## 2012-06-11 NOTE — ED Notes (Signed)
Assisted patient with Bedside commode. 

## 2012-06-12 LAB — URINE CULTURE: Colony Count: NO GROWTH

## 2012-06-14 ENCOUNTER — Encounter: Payer: Self-pay | Admitting: Family Medicine

## 2012-06-14 ENCOUNTER — Ambulatory Visit (INDEPENDENT_AMBULATORY_CARE_PROVIDER_SITE_OTHER): Payer: Medicare Other | Admitting: Family Medicine

## 2012-06-14 VITALS — BP 110/60 | HR 83 | Resp 16 | Wt 136.0 lb

## 2012-06-14 DIAGNOSIS — I10 Essential (primary) hypertension: Secondary | ICD-10-CM

## 2012-06-14 DIAGNOSIS — Z0000 Encounter for general adult medical examination without abnormal findings: Secondary | ICD-10-CM

## 2012-06-14 DIAGNOSIS — N3000 Acute cystitis without hematuria: Secondary | ICD-10-CM

## 2012-06-14 DIAGNOSIS — J309 Allergic rhinitis, unspecified: Secondary | ICD-10-CM

## 2012-06-14 DIAGNOSIS — E119 Type 2 diabetes mellitus without complications: Secondary | ICD-10-CM

## 2012-06-14 DIAGNOSIS — E785 Hyperlipidemia, unspecified: Secondary | ICD-10-CM

## 2012-06-14 DIAGNOSIS — F17200 Nicotine dependence, unspecified, uncomplicated: Secondary | ICD-10-CM

## 2012-06-14 LAB — POCT URINALYSIS DIPSTICK
Bilirubin, UA: NEGATIVE
Blood, UA: NEGATIVE
Glucose, UA: NEGATIVE
Spec Grav, UA: 1.025

## 2012-06-14 MED ORDER — MONTELUKAST SODIUM 10 MG PO TABS: 10.0000 mg | ORAL_TABLET | Freq: Every day | ORAL | Status: DC

## 2012-06-14 MED ORDER — ROSUVASTATIN CALCIUM 5 MG PO TABS: 5.0000 mg | ORAL_TABLET | Freq: Every day | ORAL | Status: DC

## 2012-06-14 MED ORDER — CLONIDINE HCL 0.3 MG PO TABS: 0.3000 mg | ORAL_TABLET | Freq: Two times a day (BID) | ORAL | Status: DC

## 2012-06-14 NOTE — Patient Instructions (Addendum)
F/u in 6 weeks  Reduce crestor to 5 mg daily, stop crestor 10 mg when you have finished what you have  Stop metformin, only glipizide for blood sugar  Reduce clonidine to one twice daily   New for your allergies is singulair and flonase, both have been sent in  Urine being checked for infection, no infection present, as well as for protein today  You will get information for help for smoking cessation through the health department, please join a class there since you need to quit  Zostavax script is sent to your pharmacy

## 2012-06-14 NOTE — Progress Notes (Signed)
Subjective:    Patient ID: Ernestina Penna, female    DOB: 11-14-1950, 62 y.o.   MRN: [id number]  HPI Preventive Screening-Counseling & Management   Patient present here today for a Medicare annual wellness visit. [place] reviewed recent labs for chronic illness C/o increased and uncontrolled allergy symptoms with season change, nasal congestion and sputum production which is clear. Also specifically inquiring for help to stop smoking she is up to 1 PPD   Current Problems (verified)   Medications Prior to Visit Allergies (verified)   PAST HISTORY  Family History  Social History Divorced, mom of 1 son, lives independently, however requires and receives a lot osf assistance with her daily needs   Risk Factors  Current exercise habits:  None significant, needs to do chair exercises  Dietary issues discussed:   Cardiac risk factors:   Depression Screen  (Note: if answer to either of the following is "Yes", a more complete depression screening is indicated)   Over the past two weeks, have you felt down, depressed or hopeless? yes Over the past two weeks, have you felt little interest or pleasure in doing things? No  Have you lost interest or pleasure in daily life? No  Do you often feel hopeless? No  Do you cry easily over simple problems? No   Activities of Daily Living  In your present state of health, do you have any difficulty performing the following activities?  Driving?: yes Managing money?: yes niece looks after bill payments Feeding yourself?:No Getting from bed to chair?:yes Climbing a flight of stairs?:yes Preparing food and eating?:yes needs help with food prep Bathing or showering?:yes needs help Getting dressed?:yes needs help Getting to the toilet?:No Using the toilet?:No Moving around from place to place?: yes , unsteady and require assistance  Fall Risk Assessment In the past year have you fallen or had a near fall?:yes 2 to 3 times Are you currently  taking any medications that make you dizziness?:No   Hearing Difficulties: No Do you often ask people to speak up or repeat themselves?:No Do you experience ringing or noises in your ears?:No Do you have difficulty understanding soft or whispered voices?:No  Cognitive Testing  Alert? Yes Normal Appearance?Yes  Oriented to person? Yes Place? Yes  Time? Yes  Displays appropriate judgment?Yes  Can read the correct time from a watch face? yes Are you having problems remembering things?No  Advanced Directives have been discussed with the patient?Yes  Full code   List the Names of Other Physician/Practitioners you currently use: see list   Indicate any recent Medical Services you may have received from other than [facility] providers in the past year (date may be approximate).   Assessment:    Annual Wellness Exam   Plan:    During the course of the visit the patient was educated and counseled about appropriate screening and preventive services including:  A healthy diet is rich in fruit, vegetables and whole grains. Poultry fish, nuts and beans are a healthy choice for protein rather then red meat. A low sodium diet and drinking 64 ounces of water daily is generally recommended. Oils and sweet should be limited. Carbohydrates especially for those who are diabetic or overweight, should be limited to 30-45 gram per meal. It is important to eat on a regular schedule, at least 3 times daily. Snacks should be primarily fruits, vegetables or nuts. It is important that you exercise regularly at least 30 minutes 5 times a week. If you develop chest pain, have  severe difficulty breathing, or feel very tired, stop exercising immediately and seek medical attention  Immunization reviewed and updated. Cancer screening reviewed and updated    Patient Instructions (the written plan) was given to the patient.  Medicare Attestation  I have personally reviewed:  The patient's medical and social history   Their use of alcohol, tobacco or illicit drugs  Their current medications and supplements  The patient's functional ability including ADLs,fall risks, home safety risks, cognitive, and hearing and visual impairment  Diet and physical activities  Evidence for depression or mood disorders  The patient's weight, height, BMI, and visual acuity have been recorded in the chart. I have made referrals, counseling, and provided education to the patient based on review of the above and I have provided the patient with a written personalized care plan for preventive services.      Review of Systems     Objective:   Physical Exam Patient alert and oriented and in no cardiopulmonary distress.  HEENT: No facial asymmetry, EOMI, no sinus tenderness,  oropharynx pink and moist.  Neck supple no adenopathy.TM clear, erythema and edema of nasal mucosa  Chest: Clear to auscultation bilaterally.decreased though adequate air entry  CVS: S1, S2 no murmurs, no S3.  ABD: Soft non tender. Bowel sounds normal.  Ext: No edema  MS: Adequate ROM spine, shoulders, hips and knees.  Skin: Intact, no ulcerations or rash noted.  Psych: Good eye contact, normal affect. Memory intact not anxious or depressed appearing.  CNS: CN 2-12 intact, power, tone and sensation normal throughout.        Assessment & Plan:

## 2012-06-14 NOTE — Assessment & Plan Note (Signed)
C/o urinary frequency, CCUA negative, pt reassured no sign of infection, urine sent for microalb

## 2012-06-14 NOTE — Assessment & Plan Note (Signed)
Hyperlipidemia:Low fat diet discussed and encouraged.  Reduce crestor dose when next fill due

## 2012-06-14 NOTE — Assessment & Plan Note (Addendum)
Annual wellness as documented. Pt is high fall risk , she suffers from chronic depression and is treated by mental health for this. She is not suicidal or homicidal. She needs assistance with her ADL's and she does get help 7 days per week Memory and cognitive function are normal, she is often excessively sleepy some of which may be medication related She is a full code, and states her son is aware of this. A niece is her power of attorney and ensures her bills are paid on time She is up to smoking 1PPD and wants to quit will join class offered by health dept

## 2012-06-14 NOTE — Assessment & Plan Note (Signed)
Over corrected with gI complaints, discontinue metformin

## 2012-06-14 NOTE — Assessment & Plan Note (Signed)
Patient counseled for approximately 5 minutes regarding the health risks of ongoing nicotine use, specifically all types of cancer, heart disease, stroke and respiratory failure. The options available for help with cessation ,the behavioral changes to assist the process, and the option to either gradully reduce usage  Or abruptly stop.is also discussed. Pt is also encouraged to set specific goals in number of cigarettes used daily, as well as to set a quit date. Wants to quit and will contact health dept for program offered

## 2012-06-14 NOTE — Assessment & Plan Note (Signed)
Uncontrolled with advent of Spring, resume flonase and singulair

## 2012-06-14 NOTE — Assessment & Plan Note (Signed)
Overcorrected, reduce clonidine dose, which should also reduce level of sedation

## 2012-06-17 ENCOUNTER — Telehealth: Payer: Self-pay | Admitting: Family Medicine

## 2012-06-18 ENCOUNTER — Ambulatory Visit (INDEPENDENT_AMBULATORY_CARE_PROVIDER_SITE_OTHER)
Admission: RE | Admit: 2012-06-18 | Discharge: 2012-06-18 | Disposition: A | Discharge status: Home / Self Care | Payer: Medicare Other | Source: Ambulatory Visit | Attending: Internal Medicine | Admitting: Internal Medicine

## 2012-06-18 ENCOUNTER — Encounter: Payer: Self-pay | Admitting: Internal Medicine

## 2012-06-18 ENCOUNTER — Ambulatory Visit (INDEPENDENT_AMBULATORY_CARE_PROVIDER_SITE_OTHER): Payer: Medicare Other | Admitting: Internal Medicine

## 2012-06-18 VITALS — BP 120/70 | HR 75 | Ht 60.5 in | Wt 132.2 lb

## 2012-06-18 DIAGNOSIS — J209 Acute bronchitis, unspecified: Secondary | ICD-10-CM

## 2012-06-18 DIAGNOSIS — G4733 Obstructive sleep apnea (adult) (pediatric): Secondary | ICD-10-CM

## 2012-06-18 MED ORDER — DOXYCYCLINE HYCLATE 100 MG PO TABS: ORAL_TABLET | ORAL | Status: DC

## 2012-06-18 NOTE — Patient Instructions (Addendum)
Order- CXR  Dx acute bronchitis  Script sent for doxycycline antibiotic  Continue the measures you were taught to reduce the chance for stomach juice to reflux up into your lungs when you are sleeping  Dx OSA  Order- [facility] DME Need to change CPAP from [place], which no longer takes her insurance to ?[place]?                               Will need replacement CPAP machine, mask of choice, supplies, heated humidifier                              Autotitrate for pressure recommendation  5-15 x 7 days, download to [name]

## 2012-06-18 NOTE — Progress Notes (Signed)
06/18/12- 8 yoF referred courtesy of Dr Claris Che [name]-sleep study last year per patient. She reports that she was witnessed stopping breathing in sleep, has a history of loud snoring and is a restless sleeper with frequent waking at night, bathroom trips and nightmares. Easy dyspnea on exertion during the day. She does smoke. New cough and sinus pressure this week attributed to "allergy" with green nasal discharge and no headache. She was sent to hospital April 1 when she says she couldn't wake up while at her doctor's office. She sleeps propped up. Gets sleepy in 30 minutes watching TV during the day. Bedtime 10 PM, sleep latency one or 2 hours, waking during the night 3 or 4 times before up at 8 AM. She had a sleep study at [facility] and was given CPAP but says she can't use it because the machine started smoking and she didn't like the pressure. She was changed from [place] to [place]. She is living alone, divorced and disabled. Son has OSA. History of meningioma 11/23/2011. No seizure in over 3 years. Does not drive. NPSG 3/8/13[name]- mild obstructive sleep apnea, AHI 14 per hour, mostly in REM. Epworth sleepiness score 14/24. Weight 164 pounds.  Prior to Admission medications   Medication Sig Start Date End Date Taking? Authorizing Provider  cloNIDine (CATAPRES) 0.3 MG tablet Take 1 tablet (0.3 mg total) by mouth 2 (two) times daily. 06/14/12 06/14/13 Yes Kerri Perches, MD  clopidogrel (PLAVIX) 75 MG tablet Take 1 tablet (75 mg total) by mouth daily. 05/15/12  Yes Kerri Perches, MD  cyclobenzaprine (FLEXERIL) 10 MG tablet Take 10 mg by mouth once.   Yes Historical Provider, MD  cycloSPORINE (RESTASIS) 0.05 % ophthalmic emulsion 1 drop 2 (two) times daily.   Yes Historical Provider, MD  diclofenac sodium (VOLTAREN) 1 % GEL Apply twice daily, as needed to leftleg for pain 12/28/11 12/27/12 Yes Kerri Perches, MD  diphenoxylate-atropine (LOMOTIL) 2.5-0.025 MG per  tablet Take 1 tablet by mouth 4 (four) times daily as needed for diarrhea or loose stools. For loose stools 04/03/12 04/03/13 Yes Joselyn Arrow, NP  fluticasone (FLONASE) 50 MCG/ACT nasal spray Place 1 spray into the nose daily. Allergies 06/14/11  Yes Kerri Perches, MD  GLIPIZIDE XL 5 MG 24 hr tablet  05/14/12  Yes Historical Provider, MD  hydrochlorothiazide (HYDRODIURIL) 12.5 MG tablet Take 12.5 mg by mouth daily.   Yes Historical Provider, MD  hydrOXYzine (ATARAX/VISTARIL) 10 MG tablet  05/23/12  Yes Historical Provider, MD  levothyroxine (LEVOXYL) 50 MCG tablet Take 50 mcg by mouth every morning.    Yes Historical Provider, MD  LORazepam (ATIVAN) 1 MG tablet Take 0.5 tablets (0.5 mg total) by mouth every 8 (eight) hours as needed for anxiety. 02/17/12  Yes Nimish Normajean Glasgow, MD  methocarbamol (ROBAXIN) 500 MG tablet  05/24/12  Yes Historical Provider, MD  metoprolol (LOPRESSOR) 50 MG tablet Take 50 mg by mouth 2 (two) times daily. 04/28/11  Yes Christiane Ha, MD  mirtazapine (REMERON) 30 MG tablet Take 30 mg by mouth at bedtime.    Yes Historical Provider, MD  montelukast (SINGULAIR) 10 MG tablet Take 1 tablet (10 mg total) by mouth at bedtime. 06/14/12 12/14/12 Yes Kerri Perches, MD  morphine (MSIR) 15 MG tablet Take 15 mg by mouth 2 (two) times daily as needed. For pain   Yes Historical Provider, MD  nystatin (MYCOSTATIN) powder Apply topically 2 (two) times daily.   Yes Historical Provider,  MD  pregabalin (LYRICA) 75 MG capsule Take 1 capsule (75 mg total) by mouth 2 (two) times daily. 10/26/11  Yes Vickki Hearing, MD  RABEprazole (ACIPHEX) 20 MG tablet Take 1 tablet (20 mg total) by mouth daily. 06/19/11  Yes Tiffany Kocher, PA-C  rosuvastatin (CRESTOR) 5 MG tablet Take 1 tablet (5 mg total) by mouth at bedtime. 06/14/12 06/14/13 Yes Kerri Perches, MD  sertraline (ZOLOFT) 100 MG tablet Take 150 mg by mouth at bedtime.    Yes Historical Provider, MD  traZODone (DESYREL) 50 MG tablet   05/14/12  Yes Historical Provider, MD  Triamcinolone Acetonide (TRIAMCINOLONE 0.1 % CREAM : EUCERIN) CREA Apply twice daily to affected area(s) for rash and itch 05/09/12 11/06/12 Yes Kerri Perches, MD  Vitamin D, Ergocalciferol, (DRISDOL) 50000 UNITS CAPS TAKE 1 CAPSULE BY MOUTH ONCE WEEKLY. 06/11/12  Yes Kerri Perches, MD  benzonatate (TESSALON PERLES) 100 MG capsule Take 1 capsule (100 mg total) by mouth every 6 (six) hours as needed for cough. 06/21/12 06/21/13  Kerri Perches, MD  doxycycline (VIBRA-TABS) 100 MG tablet 2 today then one daily 06/18/12   Waymon Budge, MD   Past Medical History  Diagnosis Date  . Bipolar disorder   . CVA (cerebral infarction)   . Pancreatitis 2006    due to Depakote with normal EUS   . Osteoporosis   . Chronic back pain   . Diabetes mellitus   . Trigger finger   . Anxiety disorder   . Hypertension   . Migraines   . Diverticulosis     TCS 9/08 by Dr. Lina Sar for diarrhea . Bx for micro scopic colitis negative.   . Schatzki's ring     non critical / EGD with ED 8/2011with RMR  . S/P colonoscopy 1610,9604, 2011    left-sided diverticula, hx of simple adenomas . 2011, random bx negative for microscopic colitis  . Glaucoma(365)   . Allergic rhinitis   . Hypothyroidism     thyroid goiter  . Anemia   . Blood transfusion   . GERD (gastroesophageal reflux disease)   . Stroke     left sided weakness  . Seizures     unknown etiology-on meds-last seizure was 3 yerars ago  . Anxiety   . Depression   . Metabolic encephalopathy 08/03/2011  . Sleep apnea   . Arthritis   . Gum symptoms     infection on antibiotic   Past Surgical History  Procedure Laterality Date  . Abdominal hysterectomy    . Cholecystectomy    . Ovarian cyst removal    . Carpal tunnel release  07/22/04    left/ Dr. Romeo Apple   . Breast reduction surgery    . Bilateral cataract surgery    . Biopsy of thyroid gland  2009  . Surgical excision of 3 tumors from right thigh  and right buttock  and left upper thigh  2010  . Back surgery  July 2012  . Spine surgery  09/29/2010    dr brooks  . Maloney dilation  12/29/2010    RMR;  . Esophagogastroduodenoscopy  12/29/2010    Rourk-Retained food in the esophagus and stomach, small hiatal hernia, status post Maloney dilation of the esophagus  . Craniotomy  11/23/2011    Procedure: CRANIOTOMY TUMOR EXCISION;  Surgeon: Hewitt Shorts, MD;  Location: [facility];  Service: Neurosurgery;  Laterality: N/A;  Craniotomy for tumor resection  . Brain surgery  11/2011  resection of meningioma   Family History  Problem Relation Age of Onset  . Heart attack Mother   . Pneumonia Father   . Kidney failure Father   . Cancer Sister     pancreatic  . Diabetes Brother   . Hypertension Brother   . Colon cancer Neg Hx   . Anesthesia problems Neg Hx   . Hypotension Neg Hx   . Malignant hyperthermia Neg Hx   . Pseudochol deficiency Neg Hx    History   Social History  . Marital Status: Divorced    Spouse Name: N/A    Number of Children: 1  . Years of Education: N/A   Occupational History  . disabled     Social History Main Topics  . Smoking status: Current Every Day Smoker -- 0.25 packs/day for 7 years    Types: Cigarettes  . Smokeless tobacco: Never Used     Comment: started at 24 smoked 6 yrs and stopped. restarted 2 months ago  . Alcohol Use: No     Comment: 5 cigs/day   . Drug Use: No  . Sexually Active: No   Other Topics Concern  . Not on file   Social History Narrative  . No narrative on file   ROS-see HPI Constitutional:   No-   weight loss, night sweats, fevers, chills, +fatigue, lassitude. HEENT:  + headaches, +difficulty swallowing, tooth/dental problems, sore throat,       +  sneezing, itching, ear ache, +nasal congestion, post nasal drip,  CV:  No-   chest pain, orthopnea, PND, swelling in lower extremities, anasarca,                                  dizziness, palpitations Resp: +   shortness of breath with exertion or at rest.              +  productive cough,  No non-productive cough,  No- coughing up of blood.              No-   change in color of mucus.  No- wheezing.   Skin: No-   rash or lesions. GI:  No-   heartburn, +indigestion, +abdominal pain, nausea, vomiting, diarrhea,                 change in bowel habits, loss of appetite GU: No-   dysuria, change in color of urine, no urgency or frequency.  No- flank pain. MS:  No-   joint pain or swelling.  No- decreased range of motion.  No- back pain. Neuro-     nothing unusual Psych:  No- change in mood or affect. No depression or anxiety.  No memory loss.  OBJ- Physical Exam General- Alert, Oriented, Affect-appropriate, Distress- none acute Skin- rash-none, lesions- none, excoriation- none Lymphadenopathy- none Head- atraumatic            Eyes- Gross vision intact, PERRLA, conjunctivae and secretions clear            Ears- Hearing, canals-normal            Nose- Clear, no-Septal dev, mucus, polyps, erosion, perforation             Throat- Mallampati III , mucosa clear , drainage- none, tonsils- atrophic, + dentures Neck- flexible , trachea midline, no stridor , thyroid nl, carotid no bruit Chest - symmetrical excursion , unlabored  Heart/CV- RRR , no murmur , no gallop  , no rub, nl s1 s2                           - JVD- none , edema- none, stasis changes- none, varices- none           Lung- clear to P&A, wheeze- none, cough+rattling , dullness-none, rub- none           Chest wall-  Abd- soft Br/ Gen/ Rectal- Not done, not indicated Extrem- cyanosis- none, clubbing, none, atrophy- none, strength- nl. cane Neuro- lethargic but responsive.

## 2012-06-19 NOTE — Progress Notes (Signed)
Quick Note:  Pt aware of results. Pt aware that I have sent results via fax EPIC to Dr Lodema Hong. ______

## 2012-06-20 NOTE — Telephone Encounter (Signed)
pls advise salt water gargles, sepachol for sore throat, also diabetic robitussin for cough, both are OTC

## 2012-06-20 NOTE — Telephone Encounter (Signed)
Patient is asking for some type of rx decongestant.

## 2012-06-21 ENCOUNTER — Other Ambulatory Visit: Payer: Self-pay | Admitting: Family Medicine

## 2012-06-21 MED ORDER — BENZONATATE 100 MG PO CAPS: 100.0000 mg | ORAL_CAPSULE | Freq: Four times a day (QID) | ORAL | Status: DC | PRN

## 2012-06-21 NOTE — Telephone Encounter (Signed)
Tessalon perles sent in pls let her know 

## 2012-06-21 NOTE — Telephone Encounter (Signed)
Noted  

## 2012-06-25 ENCOUNTER — Other Ambulatory Visit: Payer: Self-pay | Admitting: Family Medicine

## 2012-06-25 NOTE — Assessment & Plan Note (Signed)
Lethargy may reflect worse sleep says she is without her CPAP temporarily. I don't know what effect her history of meningioma and bipolar disease have on her current mental status. CPAP definitely did help until the machine stopped working. Plan-when we get her sleep study report we will help her change DME provider, get new machine and auto titrate for pressure recommendation.

## 2012-07-01 ENCOUNTER — Other Ambulatory Visit: Payer: Self-pay | Admitting: Family Medicine

## 2012-07-04 ENCOUNTER — Ambulatory Visit (INDEPENDENT_AMBULATORY_CARE_PROVIDER_SITE_OTHER): Payer: Medicare Other | Admitting: Gastroenterology

## 2012-07-04 ENCOUNTER — Encounter: Payer: Self-pay | Admitting: Gastroenterology

## 2012-07-04 VITALS — BP 133/80 | HR 69 | Temp 97.2°F | Ht 60.0 in | Wt 138.0 lb

## 2012-07-04 DIAGNOSIS — D649 Anemia, unspecified: Secondary | ICD-10-CM

## 2012-07-04 DIAGNOSIS — R1310 Dysphagia, unspecified: Secondary | ICD-10-CM

## 2012-07-04 DIAGNOSIS — K3184 Gastroparesis: Secondary | ICD-10-CM

## 2012-07-04 DIAGNOSIS — R634 Abnormal weight loss: Secondary | ICD-10-CM

## 2012-07-04 DIAGNOSIS — R109 Unspecified abdominal pain: Secondary | ICD-10-CM

## 2012-07-04 LAB — BASIC METABOLIC PANEL WITH GFR
BUN: 16 mg/dL (ref 6–23)
CO2: 30 meq/L (ref 19–32)
Calcium: 9.2 mg/dL (ref 8.4–10.5)
Chloride: 106 meq/L (ref 96–112)
Creat: 0.74 mg/dL (ref 0.50–1.10)
Glucose, Bld: 144 mg/dL — ABNORMAL HIGH (ref 70–99)
Potassium: 3.5 meq/L (ref 3.5–5.3)
Sodium: 143 meq/L (ref 135–145)

## 2012-07-04 MED ORDER — LINACLOTIDE 290 MCG PO CAPS: 1.0000 | ORAL_CAPSULE | Freq: Every day | ORAL | Status: DC

## 2012-07-04 MED ORDER — RABEPRAZOLE SODIUM 20 MG PO TBEC: 20.0000 mg | DELAYED_RELEASE_TABLET | Freq: Two times a day (BID) | ORAL | Status: DC

## 2012-07-04 MED ORDER — PROMETHAZINE HCL 12.5 MG PO TABS: 12.5000 mg | ORAL_TABLET | Freq: Three times a day (TID) | ORAL | Status: DC | PRN

## 2012-07-04 NOTE — Progress Notes (Signed)
Referring Provider: Kerri Perches, MD Primary Care Physician:  Syliva Overman, MD Primary GI: Dr. Jena Gauss   Chief Complaint  Patient presents with  . Follow-up    HPI:   [name] is a 62 year old female presenting today in follow-up with a history of gastroparesis, GERD, IBS, chronic anemia, dysphagia, chronic abdominal pain. She was seen by myself April 1st, but due to significant lethargy and altered level of consciousness, she was sent to the ED.   Last EGD in October 2012 with Dr. Jena Gauss noted small hiatal hernia, s/p empiric dilation. BPE May 2013 noted prominent tertiary contractions with swallowing, no stricture or hold-up of tablet.   Weight loss noted with highest weight in the low 150s in Sept/Dec 2013, now currently 138, which is actually improved since last visit. Has bronchitis. Notes lower abdominal discomfort, pressure. States exacerbated by eating. Very nauseated. Stays nauseated. Occasional vomiting. Easily gets full. No GERD. Has been "slow" about eating meats. Got choked last Sunday with meat. Bowels not moving like they used to. Used to have BM daily, dealing with constipation. Not taking anything for constipation.   Heme negative in ED. No rectal bleeding or melena. However, Hgb 9.8 first of April, but in Feb was 12.9. Seems her baseline is in the 9-10 range.      Past Medical History  Diagnosis Date  . Bipolar disorder   . CVA (cerebral infarction)   . Pancreatitis 2006    due to Depakote with normal EUS   . Osteoporosis   . Chronic back pain   . Diabetes mellitus   . Trigger finger   . Anxiety disorder   . Hypertension   . Migraines   . Diverticulosis     TCS 9/08 by Dr. Lina Sar for diarrhea . Bx for micro scopic colitis negative.   . Schatzki's ring     non critical / EGD with ED 8/2011with RMR  . S/P colonoscopy [id number], 2011    left-sided diverticula, hx of simple adenomas . 2011, random bx negative for microscopic colitis  . Glaucoma(365)    . Allergic rhinitis   . Hypothyroidism     thyroid goiter  . Anemia   . Blood transfusion   . GERD (gastroesophageal reflux disease)   . Stroke     left sided weakness  . Seizures     unknown etiology-on meds-last seizure was 3 yerars ago  . Anxiety   . Depression   . Metabolic encephalopathy 08/03/2011  . Sleep apnea   . Arthritis   . Gum symptoms     infection on antibiotic    Past Surgical History  Procedure Laterality Date  . Abdominal hysterectomy    . Cholecystectomy    . Ovarian cyst removal    . Carpal tunnel release  07/22/04    left/ Dr. Romeo Apple   . Breast reduction surgery    . Bilateral cataract surgery    . Biopsy of thyroid gland  2009  . Surgical excision of 3 tumors from right thigh and right buttock  and left upper thigh  2010  . Back surgery  July 2012  . Spine surgery  09/29/2010    dr brooks  . Maloney dilation  12/29/2010    RMR;  . Esophagogastroduodenoscopy  12/29/2010    Rourk-Retained food in the esophagus and stomach, small hiatal hernia, status post Maloney dilation of the esophagus  . Craniotomy  11/23/2011    Procedure: CRANIOTOMY TUMOR EXCISION;  Surgeon: Hewitt Shorts, MD;  Location: [facility];  Service: Neurosurgery;  Laterality: N/A;  Craniotomy for tumor resection  . Brain surgery  11/2011    resection of meningioma    Current Outpatient Prescriptions  Medication Sig Dispense Refill  . benzonatate (TESSALON PERLES) 100 MG capsule Take 1 capsule (100 mg total) by mouth every 6 (six) hours as needed for cough.  30 capsule  0  . cloNIDine (CATAPRES) 0.3 MG tablet Take 1 tablet (0.3 mg total) by mouth 2 (two) times daily.  60 tablet  11  . clopidogrel (PLAVIX) 75 MG tablet Take 1 tablet (75 mg total) by mouth daily.  30 tablet  3  . cycloSPORINE (RESTASIS) 0.05 % ophthalmic emulsion 1 drop 2 (two) times daily.      . diclofenac sodium (VOLTAREN) 1 % GEL Apply twice daily, as needed to leftleg for pain  100 g  1  .  diphenoxylate-atropine (LOMOTIL) 2.5-0.025 MG per tablet Take 1 tablet by mouth 4 (four) times daily as needed for diarrhea or loose stools. For loose stools  30 tablet  1  . fluticasone (FLONASE) 50 MCG/ACT nasal spray Place 1 spray into the nose daily. Allergies  16 g  3  . GLIPIZIDE XL 5 MG 24 hr tablet TAKE 1 TABLET BY MOUTH ONCE DAILY.  30 tablet  5  . hydrochlorothiazide (HYDRODIURIL) 12.5 MG tablet Take 12.5 mg by mouth daily.      . hydrOXYzine (ATARAX/VISTARIL) 10 MG tablet Take 10 mg by mouth every 6 (six) hours as needed.       . lamoTRIgine (LAMICTAL) 100 MG tablet Take 100 mg by mouth daily.       [name] levothyroxine (LEVOXYL) 50 MCG tablet Take 50 mcg by mouth every morning.       [name] LORazepam (ATIVAN) 1 MG tablet Take 0.5 tablets (0.5 mg total) by mouth every 8 (eight) hours as needed for anxiety.  30 tablet    . methocarbamol (ROBAXIN) 500 MG tablet       . metoprolol (LOPRESSOR) 50 MG tablet Take 50 mg by mouth 2 (two) times daily.      . mirtazapine (REMERON) 30 MG tablet Take 30 mg by mouth at bedtime.       . montelukast (SINGULAIR) 10 MG tablet Take 1 tablet (10 mg total) by mouth at bedtime.  30 tablet  5  . morphine (MSIR) 15 MG tablet Take 15 mg by mouth 2 (two) times daily as needed. For pain      . nystatin (MYCOSTATIN) powder Apply topically 2 (two) times daily.      . pregabalin (LYRICA) 75 MG capsule Take 1 capsule (75 mg total) by mouth 2 (two) times daily.  60 capsule  2  . RABEprazole (ACIPHEX) 20 MG tablet Take 1 tablet (20 mg total) by mouth daily.  31 tablet  11  . rosuvastatin (CRESTOR) 5 MG tablet Take 1 tablet (5 mg total) by mouth at bedtime.  30 tablet  11  . sertraline (ZOLOFT) 100 MG tablet Take 150 mg by mouth at bedtime.       . traZODone (DESYREL) 50 MG tablet       . Triamcinolone Acetonide (TRIAMCINOLONE 0.1 % CREAM : EUCERIN) CREA Apply twice daily to affected area(s) for rash and itch  1 each  0  . Vitamin D, Ergocalciferol, (DRISDOL) 50000 UNITS CAPS  TAKE 1 CAPSULE BY MOUTH ONCE WEEKLY.  4 capsule  3   No current facility-administered medications for this visit.  Allergies as of 07/04/2012 - Review Complete 07/04/2012  Allergen Reaction Noted  . Cephalexin Hives   . Milk-related compounds Other (See Comments) 07/29/2008  . Penicillins Hives   . Phenazopyridine hcl Other (See Comments) 06/19/2007    Family History  Problem Relation Age of Onset  . Heart attack Mother   . Pneumonia Father   . Kidney failure Father   . Cancer Sister     pancreatic  . Diabetes Brother   . Hypertension Brother   . Colon cancer Neg Hx   . Anesthesia problems Neg Hx   . Hypotension Neg Hx   . Malignant hyperthermia Neg Hx   . Pseudochol deficiency Neg Hx     History   Social History  . Marital Status: Divorced    Spouse Name: N/A    Number of Children: 1  . Years of Education: N/A   Occupational History  . disabled     Social History Main Topics  . Smoking status: Current Every Day Smoker -- 0.25 packs/day for 7 years    Types: Cigarettes  . Smokeless tobacco: Never Used     Comment: started at 24 smoked 6 yrs and stopped. restarted 2 months ago  . Alcohol Use: No     Comment: 5 cigs/day   . Drug Use: No  . Sexually Active: No   Other Topics Concern  . None   Social History Narrative  . None    Review of Systems: Negative unless mentioned in HPI   Physical Exam: BP 133/80  Pulse 69  Temp(Src) 97.2 F (36.2 C) (Oral)  Ht 5' (1.524 m)  Wt 138 lb (62.596 kg)  BMI 26.95 kg/m2 General:   Alert and oriented. No distress noted. Pleasant and cooperative.  Head:  Normocephalic and atraumatic. Eyes:  Conjuctiva clear without scleral icterus. Mouth:  Oral mucosa pink and moist. Good dentition. No lesions. Heart:  S1, S2 present without murmurs, rubs, or gallops. Regular rate and rhythm. Abdomen:  +BS, soft, non-tender and non-distended. No rebound or guarding. No HSM or masses noted. Msk:  Symmetrical without gross  deformities. Normal posture. Extremities:  Without edema. Neurologic:  Alert and  oriented x4;  grossly normal neurologically. Skin:  Intact without significant lesions or rashes. Cervical Nodes:  No significant cervical adenopathy. Psych:  Alert and cooperative. Normal mood and affect.

## 2012-07-04 NOTE — Patient Instructions (Addendum)
MEDICATION CHANGES:  INCREASE ACIPHEX TO TWICE A DAY.  TAKE PHENERGAN ONLY AS NEEDED FOR NAUSEA, EVERY 8 HOURS. THIS CAN CAUSE SEDATION.   START TAKING LINZESS EACH MORNING, 30 MINUTES BEFORE BREAKFAST. THIS IS FOR CONSTIPATION. I HAVE SENT THIS TO YOUR PHARMACY AND WE HAVE GIVEN YOU SAMPLES TO GET STARTED.  PLEASE HAVE BLOOD WORK DONE. THEN, WE HAVE ALSO SET YOU UP FOR A SCAN OF YOUR BELLY.  WE MAY NEED TO UPDATE THE COLONOSCOPY. WE WILL BE IN TOUCH REGARDING THE PLAN AFTER REVIEW OF BLOODWORK.

## 2012-07-05 DIAGNOSIS — D649 Anemia, unspecified: Secondary | ICD-10-CM | POA: Insufficient documentation

## 2012-07-05 DIAGNOSIS — R634 Abnormal weight loss: Secondary | ICD-10-CM | POA: Insufficient documentation

## 2012-07-05 LAB — CBC WITH DIFFERENTIAL/PLATELET
Eosinophils Absolute: 0.2 10*3/uL (ref 0.0–0.7)
Hemoglobin: 10.5 g/dL — ABNORMAL LOW (ref 12.0–15.0)
Lymphocytes Relative: 34 % (ref 12–46)
Lymphs Abs: 2.2 10*3/uL (ref 0.7–4.0)
MCH: 21.2 pg — ABNORMAL LOW (ref 26.0–34.0)
MCV: 73 fL — ABNORMAL LOW (ref 78.0–100.0)
Monocytes Relative: 8 % (ref 3–12)
Neutrophils Relative %: 55 % (ref 43–77)
RBC: 4.96 MIL/uL (ref 3.87–5.11)

## 2012-07-05 LAB — FERRITIN: Ferritin: 77 ng/mL (ref 10–291)

## 2012-07-05 NOTE — Assessment & Plan Note (Signed)
Likely culprit of nausea, early satiety. Nausea likely multifactorial; patient is on multiple meds.  Increase Aciphex to BID Add low-dose Phenergan ONLY prn Zofran with meals

## 2012-07-05 NOTE — Assessment & Plan Note (Signed)
Chronic, with drop in Hgb from 12 to 9 range recently. Question if true result, as she tends to remain in the 9-10 range. No overt signs of GI bleeding, EGD fairly recent but last colonoscopy in 2011.  Repeat CBC Add ferritin, iron Consider ifobt, updated colonoscopy

## 2012-07-05 NOTE — Assessment & Plan Note (Signed)
Noted dysphagia with meat recently but has been doing well overall. Last EGD in October 2012 with Dr. Jena Gauss noted small hiatal hernia, s/p empiric dilation. BPE May 2013 noted prominent tertiary contractions with swallowing, no stricture or hold-up of tablet. No further investigation unless symptoms worsen. Likely component of esophageal dysmotility.

## 2012-07-05 NOTE — Assessment & Plan Note (Signed)
62 year old female with a history of chronic abdominal pain, now with weight loss greater than 10 lbs in past few months, worsened lower abdominal pain with eating, and chronic constipation. Could simply be dealing with IBS and weight loss secondary to decreased po intake; however, I feel her symptoms warrant further investigation to rule out mesenteric ischemia as a possible source of abdominal pain.   CT angiography in near future Start Linzess 290 mcg daily Further recommendations once CT reviewed

## 2012-07-06 IMAGING — CT CT HEAD W/O CM
1 of 2 series · 16 of 30 positions shown, 20 images · non-contrast
Comparison: 05/16/2010

CLINICAL DATA: Altered mental status.  Found on the bathroom floor.

CT HEAD WITHOUT CONTRAST
TECHNIQUE: Contiguous axial images were obtained from the base of
the skull through the vertex without contrast.

[Series 2: headtrauma 4.8 h37s · axial · 0.47mm/px · z∈[+147,+310]mm · 16 of 36 slices shown, 20 images]
[im 2/36  brain]
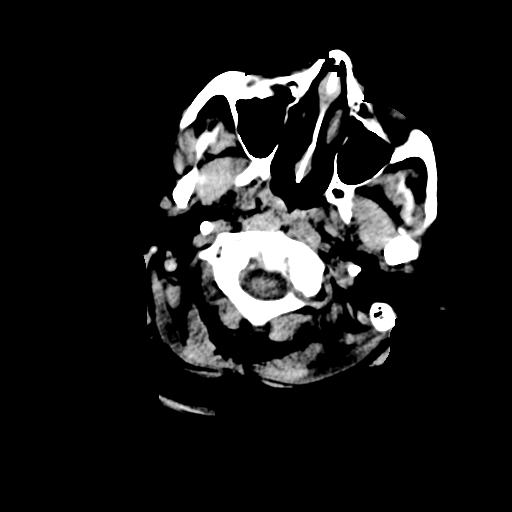
[im 2/36  bone]
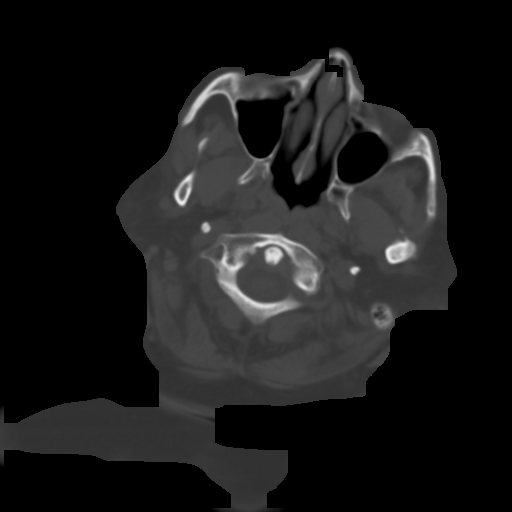
[im 4/36  brain]
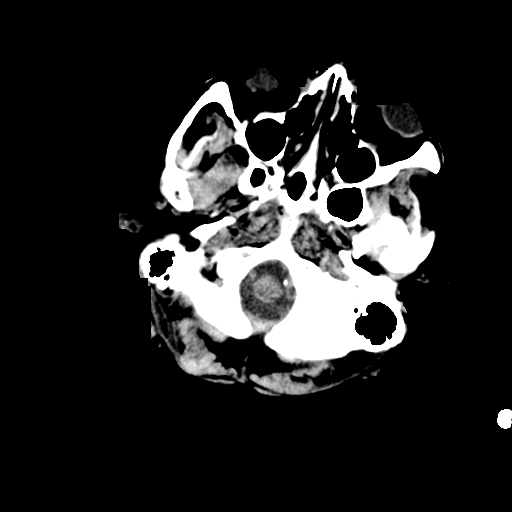
[im 6/36  brain]
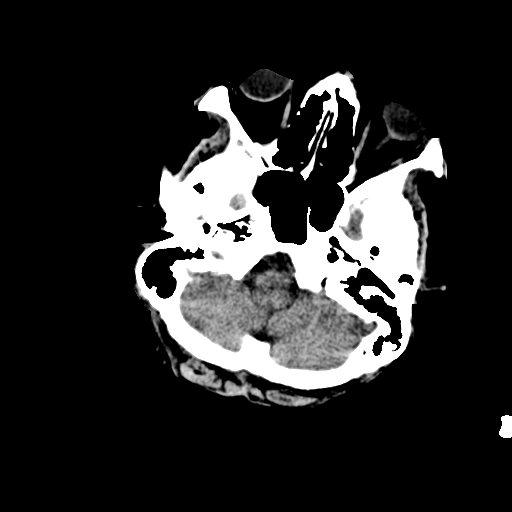
[im 8/36  brain]
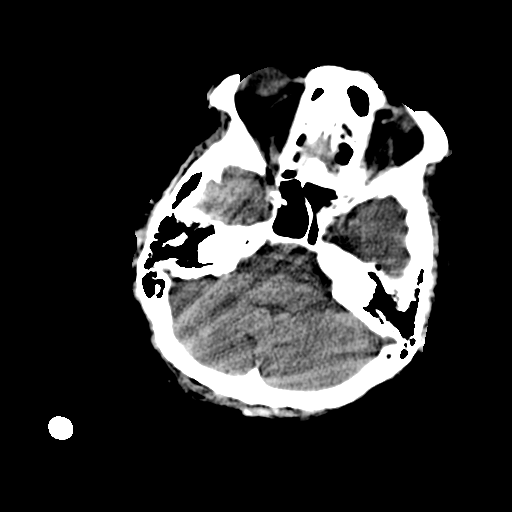
[im 12/36  brain]
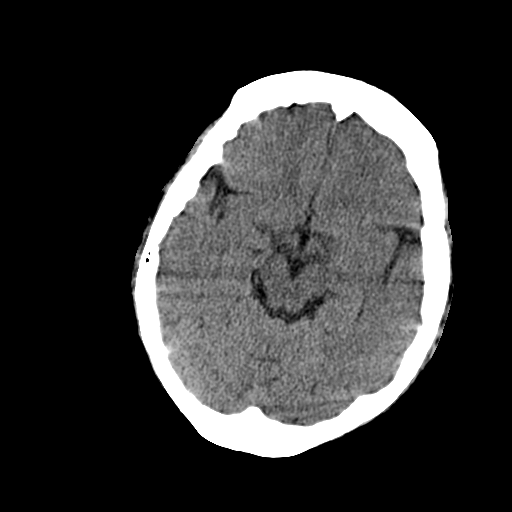
[im 12/36  bone]
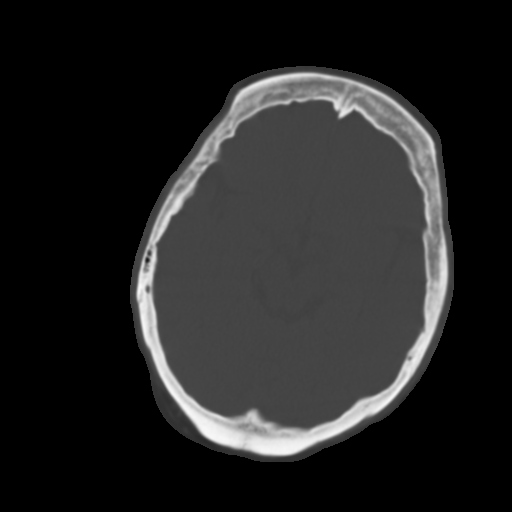
[im 13/36  brain]
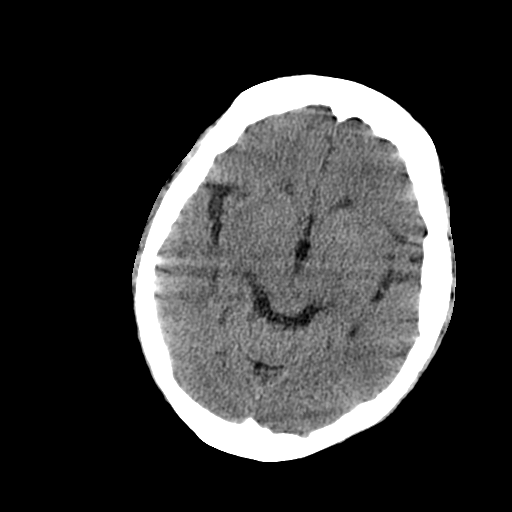
[im 15/36  brain]
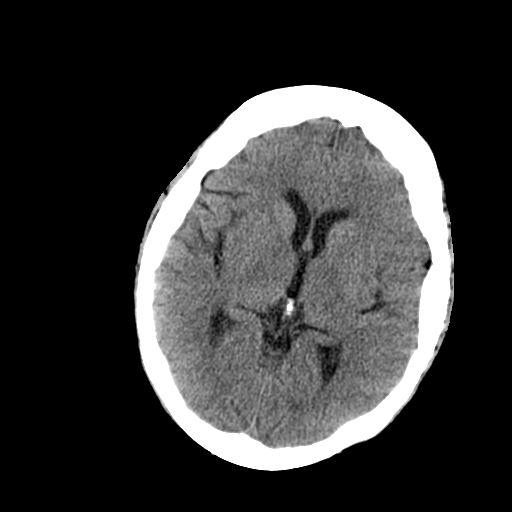
[im 17/36  brain]
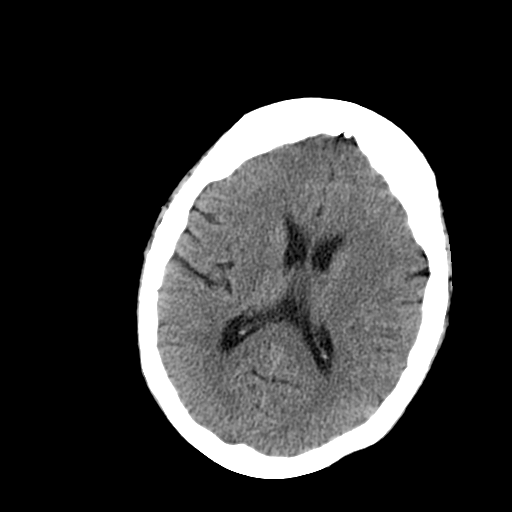
[im 19/36  brain]
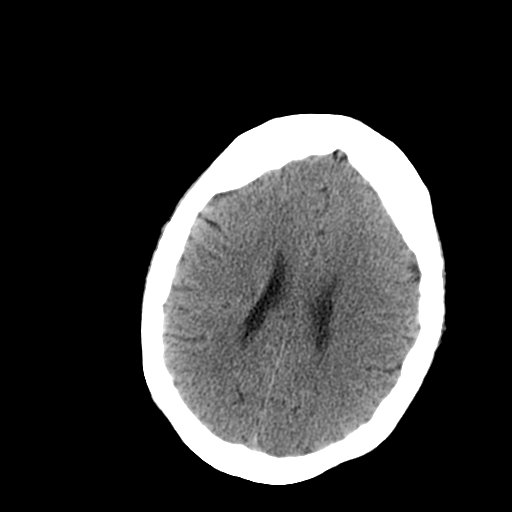
[im 19/36  bone]
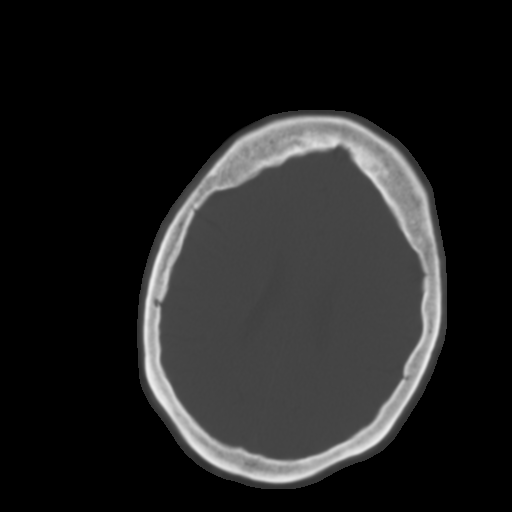
[im 21/36  brain]
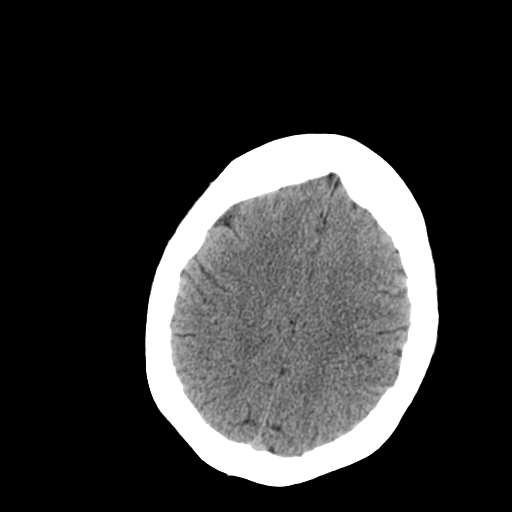
[im 23/36  brain]
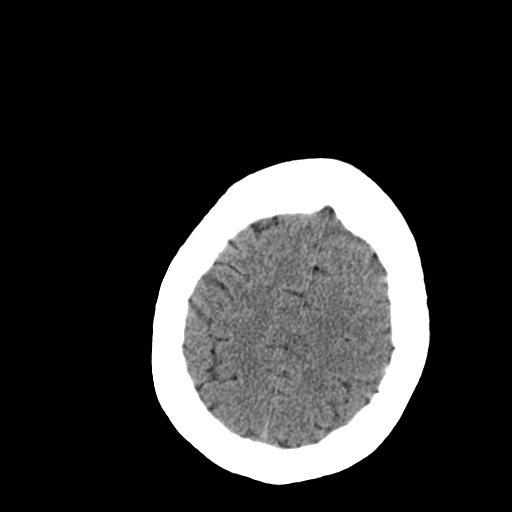
[im 24/36  brain]
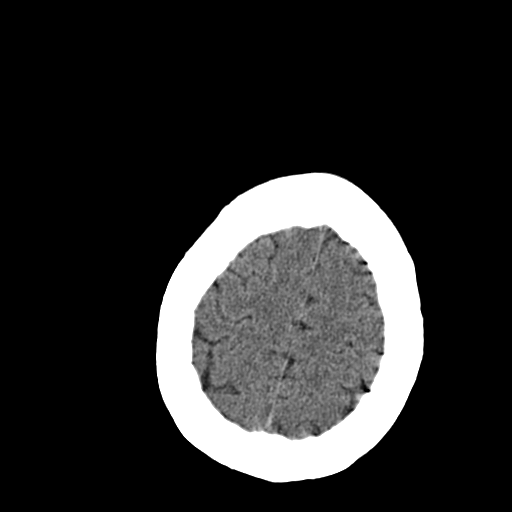
[im 28/36  brain]
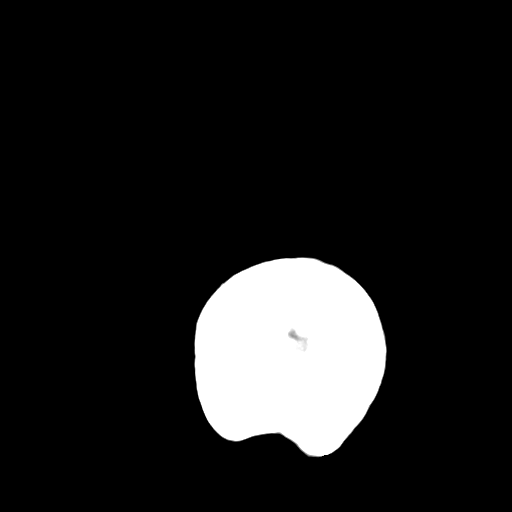
[im 28/36  bone]
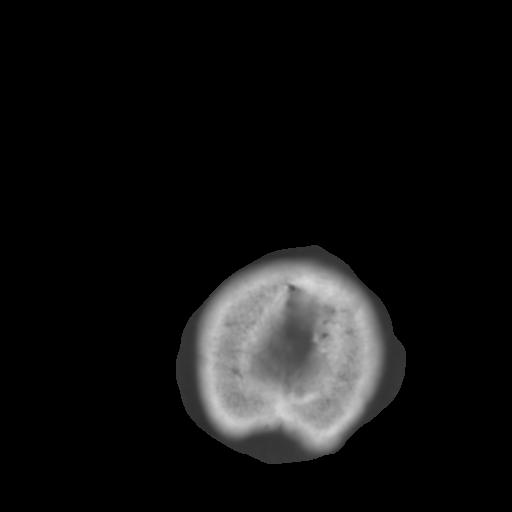
[im 30/36  brain]
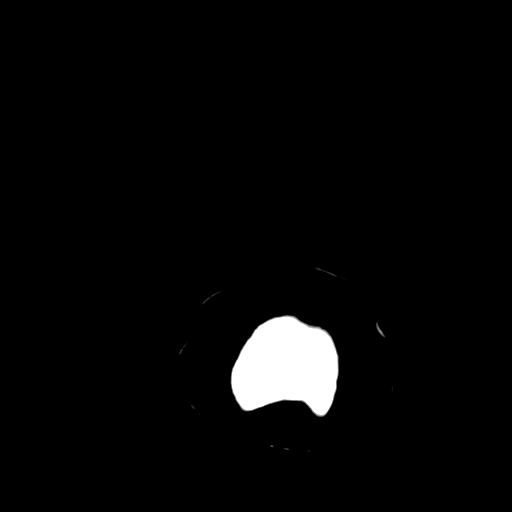
[im 32/36  brain]
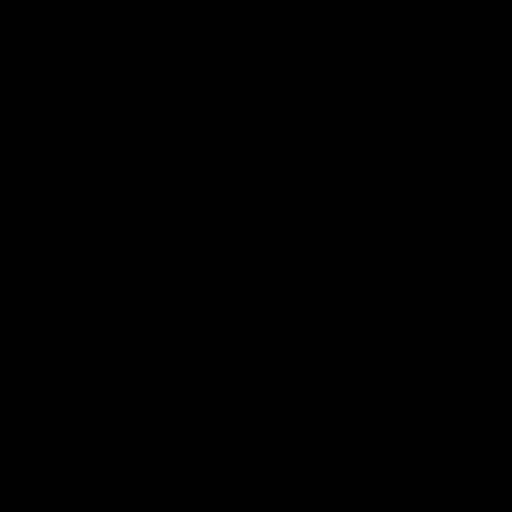
[im 34/36  brain]
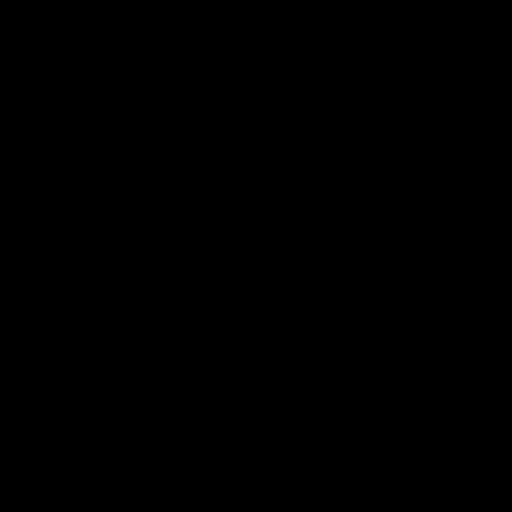

[16 of 30 positions shown; findings below may reference images not displayed]

FINDINGS: The ventricles and sulci appear symmetrical.  No mass
effect or midline shift.  No significant ventricular dilatation.
No abnormal extra-axial fluid collections.  Gray-white matter
junctions are distinct.  Basal cisterns are not effaced.  No
evidence of acute intracranial hemorrhage.  Arterial vascular
calcifications.  No depressed skull fractures.  Visualized
paranasal sinuses are not opacified.  No significant changes since
the previous study.
IMPRESSION: No evidence of acute intracranial hemorrhage, mass lesion, or acute
infarct.

## 2012-07-08 ENCOUNTER — Ambulatory Visit (HOSPITAL_COMMUNITY): Payer: Medicare Other

## 2012-07-08 ENCOUNTER — Telehealth: Payer: Self-pay | Admitting: Family Medicine

## 2012-07-08 NOTE — Telephone Encounter (Signed)
Noted  

## 2012-07-08 NOTE — Telephone Encounter (Signed)
The smoking patches wopuld be in place of the cigarettes, she can also get the gum through the health dept free, if she joins that group At her next visit I will discuss the test she is asking about , if she has not already done so wioth dr Maple Hudson or wants to discuss further

## 2012-07-08 NOTE — Telephone Encounter (Signed)
Wants to know if she can use the smoking patches with her high blood pressure and if it would increase her heart rate? Also states he had a test ordered by Dr Maple Hudson and was told she had a build up in her arteries and wants to know more about this

## 2012-07-08 NOTE — Progress Notes (Signed)
Forwarding to PCP.

## 2012-07-15 ENCOUNTER — Telehealth: Payer: Self-pay

## 2012-07-15 NOTE — Telephone Encounter (Signed)
She is describing arm pain which suggests a neck problem , no xray will be ordered

## 2012-07-16 ENCOUNTER — Telehealth: Payer: Self-pay | Admitting: Family Medicine

## 2012-07-16 ENCOUNTER — Ambulatory Visit (HOSPITAL_COMMUNITY)
Admission: RE | Admit: 2012-07-16 | Discharge: 2012-07-16 | Disposition: A | Discharge status: Home / Self Care | Payer: Medicare Other | Source: Ambulatory Visit | Attending: Gastroenterology | Admitting: Gastroenterology

## 2012-07-16 DIAGNOSIS — R634 Abnormal weight loss: Secondary | ICD-10-CM | POA: Insufficient documentation

## 2012-07-16 DIAGNOSIS — R918 Other nonspecific abnormal finding of lung field: Secondary | ICD-10-CM | POA: Insufficient documentation

## 2012-07-16 DIAGNOSIS — R109 Unspecified abdominal pain: Secondary | ICD-10-CM | POA: Insufficient documentation

## 2012-07-16 MED ORDER — IOHEXOL 350 MG/ML SOLN
100.0000 mL | Freq: Once | INTRAVENOUS | Status: AC | PRN
  Administered 2012-07-16: 100 mL via INTRAVENOUS

## 2012-07-17 ENCOUNTER — Telehealth: Payer: Self-pay | Admitting: Internal Medicine

## 2012-07-17 MED ORDER — LINACLOTIDE 290 MCG PO CAPS: 1.0000 | ORAL_CAPSULE | Freq: Every day | ORAL | Status: DC

## 2012-07-17 NOTE — Addendum Note (Signed)
Addended by: Nira Retort on: 07/17/2012 04:56 PM   Modules accepted: Orders

## 2012-07-17 NOTE — Telephone Encounter (Signed)
Routing to AS for results.  

## 2012-07-17 NOTE — Telephone Encounter (Addendum)
Miralax purge: 1 dose every hour up to 6 doses or until bowel movement.  I started her on Linzess 290 mcg at last appt. Is she taking this? I sent in a prescription just in case.   Her CTA was negative for mesenteric ischemia. SMA, IMA, and celiac all widely patent.   She needs a repeat CT chest in 3 months due to a small pulmonary nodule.   Her ferritin is still normal, but it has dropped from 2012.  Iron normal. Anemia overall unchanged.   Need ifobt. If positive: needs colonoscopy.

## 2012-07-17 NOTE — Telephone Encounter (Signed)
Pt had CT done yesterday and is calling to check on the results. Please call her when available and she is also asking to speak with [facility] or the nurse about her not having a BM since Saturday and her stomach pains. Please advise [phone]

## 2012-07-18 NOTE — Telephone Encounter (Signed)
See recommendations

## 2012-07-19 NOTE — Telephone Encounter (Signed)
Tried to call pt- NA 

## 2012-07-23 NOTE — Telephone Encounter (Signed)
Pt is aware. ifobt in mail to pt. Pt is taking the linzess and has had some diarrhea at times but not a lot. Pt will call back if she needs to change dose of linzess.   Darl Pikes, please nic repeat CT

## 2012-07-24 NOTE — Telephone Encounter (Signed)
Patient has appt 5/15 to discuss with md.

## 2012-07-24 NOTE — Telephone Encounter (Signed)
Patient has appointment 5.15.2014

## 2012-07-25 ENCOUNTER — Ambulatory Visit (INDEPENDENT_AMBULATORY_CARE_PROVIDER_SITE_OTHER): Payer: Medicare Other | Admitting: Family Medicine

## 2012-07-25 ENCOUNTER — Telehealth: Payer: Self-pay | Admitting: General Practice

## 2012-07-25 ENCOUNTER — Encounter: Payer: Self-pay | Admitting: Family Medicine

## 2012-07-25 ENCOUNTER — Telehealth: Payer: Self-pay | Admitting: Family Medicine

## 2012-07-25 VITALS — BP 140/70 | HR 96 | Resp 18 | Ht 62.0 in | Wt 138.0 lb

## 2012-07-25 DIAGNOSIS — F17200 Nicotine dependence, unspecified, uncomplicated: Secondary | ICD-10-CM

## 2012-07-25 DIAGNOSIS — M25512 Pain in left shoulder: Secondary | ICD-10-CM

## 2012-07-25 DIAGNOSIS — F060 Psychotic disorder with hallucinations due to known physiological condition: Secondary | ICD-10-CM

## 2012-07-25 DIAGNOSIS — I2510 Atherosclerotic heart disease of native coronary artery without angina pectoris: Secondary | ICD-10-CM

## 2012-07-25 DIAGNOSIS — E785 Hyperlipidemia, unspecified: Secondary | ICD-10-CM

## 2012-07-25 DIAGNOSIS — R911 Solitary pulmonary nodule: Secondary | ICD-10-CM

## 2012-07-25 DIAGNOSIS — K219 Gastro-esophageal reflux disease without esophagitis: Secondary | ICD-10-CM

## 2012-07-25 DIAGNOSIS — R079 Chest pain, unspecified: Secondary | ICD-10-CM

## 2012-07-25 DIAGNOSIS — I10 Essential (primary) hypertension: Secondary | ICD-10-CM

## 2012-07-25 DIAGNOSIS — E119 Type 2 diabetes mellitus without complications: Secondary | ICD-10-CM

## 2012-07-25 DIAGNOSIS — M25519 Pain in unspecified shoulder: Secondary | ICD-10-CM

## 2012-07-25 MED ORDER — GLIPIZIDE ER 2.5 MG PO TB24: 2.5000 mg | ORAL_TABLET | Freq: Every day | ORAL | Status: DC

## 2012-07-25 NOTE — Progress Notes (Signed)
  Subjective:    Patient ID: [name], female    DOB: Dec 08, 1950, 62 y.o.   MRN: [id number]  HPI The PT is here for follow up and re-evaluation of chronic medical conditions, medication management and review of any available recent lab and radiology data.  Preventive health is updated, specifically  Cancer screening and Immunization.   Questions or concerns regarding consultations or procedures which the PT has had in the interim are  Addressed.Recently had imaging study which showed a lot of calcification in the coronary arteries, so needs re evaluation by cardiology,no established dx of CAD at this time The PT denies any adverse reactions to current medications since the last visit.  New onset left shoulder pain, no aggravating trauma, some limitation in mobility Denies polyuria, polydipsia, blurred vision or hypoglycemic episodes     Review of Systems See HPI Denies recent fever or chills. Denies sinus pressure, nasal congestion, ear pain or sore throat. Denies chest congestion, productive cough or wheezing. Denies chest pains, palpitations and leg swelling Denies abdominal pain, nausea, vomiting,diarrhea or constipation.   Denies dysuria, frequency, hesitancy or incontinence. Denies headaches, seizures, numbness, or tingling. Denies uncontrolled  depression, anxiety or insomnia. Denies skin break down or rash.        Objective:   Physical Exam  Patient alert and oriented and in no cardiopulmonary distress.  HEENT: No facial asymmetry, EOMI, no sinus tenderness,  oropharynx pink and moist.  Neck supple no adenopathy.  Chest: Clear to auscultation bilaterally.Decreased though adequate air entry  CVS: S1, S2 no murmurs, no S3.  ABD: Soft non tender. Bowel sounds normal.  Ext: No edema  MS: decreased  ROM spine,decreased in  Shoulders,adequate in  hips and knees.  Skin: Intact, no ulcerations or rash noted.  Psych: Good eye contact, normal affect. Memory impaired   not anxious or depressed appearing.  CNS: CN 2-12 intact, power, tone and sensation normal throughout.       Assessment & Plan:

## 2012-07-25 NOTE — Telephone Encounter (Signed)
Patient is calling in complaing of abd pain and straining to have bowel movements followed by diarrhea.  Please agdvise?

## 2012-07-25 NOTE — Patient Instructions (Addendum)
F/u in 3.5 month, call if you need me before  Stop glipizide 5mg  tablet and start glipizide 2.5 mg tablet effective 07/25/2012  Please sign for Dr Hardin Negus note, and the last office note from your cardiologist.  I will send your cardiologist a note with the cxr report to explain why you are seeing him  Call if you want a referral for left shoulder pain to physical therapy  HBA1C and chem 7 in fasting lipid and cmp in 3.5 month

## 2012-07-26 NOTE — Telephone Encounter (Signed)
Tried to call pt- LMOM 

## 2012-07-26 NOTE — Telephone Encounter (Signed)
The pulmonary Doc is prescribing her CPAP equipment, Dr Maple Hudson, I am NOT,pls let her [facility]

## 2012-07-26 NOTE — Telephone Encounter (Signed)
Called and left msg for Mandy with [facility] to notify her that Dr. Maple Hudson is to write rx.  Spoke with [facility] and clarified order.

## 2012-07-29 NOTE — Telephone Encounter (Signed)
Spoke with pt- she only had the watery diarrhea on Friday. She has pain and nausea when she eats or takes her medicine. She is taking her nausea medicine and feels like it helps some. Last normal bm was yesterday but she had to sit and strain for it to come out. Stool is soft. Pt stated she feels like she has to go but when she sits down to have a bm it wont come out. Please advise.

## 2012-08-01 NOTE — Telephone Encounter (Signed)
Reminder in epic °

## 2012-08-06 ENCOUNTER — Ambulatory Visit: Payer: Medicare Other | Admitting: Internal Medicine

## 2012-08-06 ENCOUNTER — Telehealth: Payer: Self-pay | Admitting: Internal Medicine

## 2012-08-06 NOTE — Telephone Encounter (Signed)
Pt instruction from last OV as follows:  [facility] from [place], which no longer takes her insurance to ?[place]?  Will need replacement CPAP machine, mask of choice, supplies, heated humidifier  [employer] for pressure recommendation 5-15 x 7 days, download to [name]   I advised Angelica Chessman that according to this he wants download after 7 days. She states understanding. Carron Curie, CMA

## 2012-08-07 NOTE — Telephone Encounter (Signed)
Tried to call pt- NA 

## 2012-08-07 NOTE — Telephone Encounter (Signed)
Make sure she is taking Linzess daily.  Offer f/u visit to set up ?TCS+/- EGD with me.  Need ifobt before she comes back. Encouragingly, her Hgb has improved, but she may have an underlying IDA component. CT angiogram negative for mesenteric ischemia, which is good. There was a lung nodule that will need to be followed-up on.

## 2012-08-08 NOTE — Telephone Encounter (Signed)
Tried to call pt- NA 

## 2012-08-12 NOTE — Telephone Encounter (Signed)
Tried to call pt- NA 

## 2012-08-12 NOTE — Telephone Encounter (Signed)
Pt aware. ifobt in mail to pt.  Darl Pikes, please schedule

## 2012-08-13 NOTE — Telephone Encounter (Signed)
Pt is aware of OV on 6/24 at 130 with AS and appt card was mailed

## 2012-08-15 ENCOUNTER — Ambulatory Visit (INDEPENDENT_AMBULATORY_CARE_PROVIDER_SITE_OTHER): Payer: Medicare Other | Admitting: Internal Medicine

## 2012-08-15 ENCOUNTER — Encounter: Payer: Self-pay | Admitting: Internal Medicine

## 2012-08-15 ENCOUNTER — Ambulatory Visit: Payer: Medicare Other | Admitting: Internal Medicine

## 2012-08-15 VITALS — BP 140/80 | HR 57 | Ht 62.0 in | Wt 139.6 lb

## 2012-08-15 DIAGNOSIS — I10 Essential (primary) hypertension: Secondary | ICD-10-CM

## 2012-08-15 DIAGNOSIS — E785 Hyperlipidemia, unspecified: Secondary | ICD-10-CM

## 2012-08-15 DIAGNOSIS — F319 Bipolar disorder, unspecified: Secondary | ICD-10-CM

## 2012-08-15 DIAGNOSIS — R002 Palpitations: Secondary | ICD-10-CM

## 2012-08-15 DIAGNOSIS — D320 Benign neoplasm of cerebral meninges: Secondary | ICD-10-CM

## 2012-08-15 DIAGNOSIS — G4733 Obstructive sleep apnea (adult) (pediatric): Secondary | ICD-10-CM

## 2012-08-15 DIAGNOSIS — D329 Benign neoplasm of meninges, unspecified: Secondary | ICD-10-CM

## 2012-08-15 NOTE — Progress Notes (Signed)
OFFICE NOTE  Chief Complaint:  Routine follow-up  Primary Care Physician: Syliva Overman, MD  HPI:  [name] is a 62 year-old female with history of hypertension, diabetes, dyslipidemia, obstructive sleep apnea on CPAP as well as struggling with depression, Bipolar Disorder, insomnia and chronic low back pain. Recently she underwent brain surgery after I had cleared her and felt that she was an intermediate risk for perioperative events. She did do well with the surgery and reports her pain as improved as well as some of her lower extremity swelling. Her main complaint today is depression and insomnia, and I understand that she has seen you about this and as far as management has been deferred to her psychiatrist. She is complaining that she cannot get to see her psychiatrist as he is on extended medical leave. From a cardiac standpoint, her blood pressure is actually very well controlled on her current regimen including clonidine, today was 140/80, heart rate in the 80s, and she denies any chest pain, worsening shortness of breath, palpitations, presyncope, or syncopal symptoms. Her main complaints have to do with memory loss, confusion and speech difficulty as well as left arm pain for which she is seeing Dr. Romeo Apple.  PMHx:  Past Medical History  Diagnosis Date  . Bipolar disorder   . CVA (cerebral infarction)   . Pancreatitis 2006    due to Depakote with normal EUS   . Osteoporosis   . Chronic back pain   . Diabetes mellitus   . Trigger finger   . Anxiety disorder   . Hypertension   . Migraines   . Diverticulosis     TCS 9/08 by Dr. Lina Sar for diarrhea . Bx for micro scopic colitis negative.   . Schatzki's ring     non critical / EGD with ED 8/2011with RMR  . S/P colonoscopy 0981,1914, 2011    left-sided diverticula, hx of simple adenomas . 2011, random bx negative for microscopic colitis  . Glaucoma   . Allergic rhinitis   . Hypothyroidism     thyroid goiter  .  Anemia   . Blood transfusion   . GERD (gastroesophageal reflux disease)   . Stroke     left sided weakness  . Seizures     unknown etiology-on meds-last seizure was 3 yerars ago  . Anxiety   . Depression   . Metabolic encephalopathy 08/03/2011  . Sleep apnea   . Arthritis   . Gum symptoms     infection on antibiotic    Past Surgical History  Procedure Laterality Date  . Abdominal hysterectomy    . Cholecystectomy    . Ovarian cyst removal    . Carpal tunnel release  07/22/04    left/ Dr. Romeo Apple   . Breast reduction surgery    . Bilateral cataract surgery    . Biopsy of thyroid gland  2009  . Surgical excision of 3 tumors from right thigh and right buttock  and left upper thigh  2010  . Back surgery  July 2012  . Spine surgery  09/29/2010    dr brooks  . Maloney dilation  12/29/2010    RMR;  . Esophagogastroduodenoscopy  12/29/2010    Rourk-Retained food in the esophagus and stomach, small hiatal hernia, status post Maloney dilation of the esophagus  . Craniotomy  11/23/2011    Procedure: CRANIOTOMY TUMOR EXCISION;  Surgeon: Hewitt Shorts, MD;  Location: [facility];  Service: Neurosurgery;  Laterality: N/A;  Craniotomy for tumor  resection  . Brain surgery  11/2011    resection of meningioma    FAMHx:  Family History  Problem Relation Age of Onset  . Heart attack Mother   . Pneumonia Father   . Kidney failure Father   . Cancer Sister     pancreatic  . Diabetes Brother   . Hypertension Brother   . Colon cancer Neg Hx   . Anesthesia problems Neg Hx   . Hypotension Neg Hx   . Malignant hyperthermia Neg Hx   . Pseudochol deficiency Neg Hx     SOCHx:   reports that she has been smoking Cigarettes.  She has a 1.75 pack-year smoking history. She has never used smokeless tobacco. She reports that she does not drink alcohol or use illicit drugs.  ALLERGIES:  Allergies  Allergen Reactions  . Cephalexin Hives  . Milk-Related Compounds Other (See Comments)     Doesn't agree with stomach.   . Penicillins Hives  . Phenazopyridine Hcl Other (See Comments)    Whelps      ROS: A comprehensive review of systems was negative except for: Constitutional: positive for fatigue and weight loss Cardiovascular: positive for palpitations Gastrointestinal: positive for change in bowel habits and diarrhea Musculoskeletal: positive for muscle weakness Neurological: positive for memory problems and speech problems Behavioral/Psych: positive for bipolar  HOME MEDS: Current Outpatient Prescriptions  Medication Sig Dispense Refill  . benzonatate (TESSALON PERLES) 100 MG capsule Take 1 capsule (100 mg total) by mouth every 6 (six) hours as needed for cough.  30 capsule  0  . cloNIDine (CATAPRES) 0.3 MG tablet Take 1 tablet (0.3 mg total) by mouth 2 (two) times daily.  60 tablet  11  . clopidogrel (PLAVIX) 75 MG tablet Take 1 tablet (75 mg total) by mouth daily.  30 tablet  3  . cyclobenzaprine (FLEXERIL) 10 MG tablet Take 10 mg by mouth 3 (three) times daily as needed for muscle spasms.      . cycloSPORINE (RESTASIS) 0.05 % ophthalmic emulsion 1 drop 2 (two) times daily.      . diclofenac sodium (VOLTAREN) 1 % GEL Apply twice daily, as needed to leftleg for pain  100 g  1  . diphenoxylate-atropine (LOMOTIL) 2.5-0.025 MG per tablet Take 1 tablet by mouth 4 (four) times daily as needed for diarrhea or loose stools. For loose stools  30 tablet  1  . fluticasone (FLONASE) 50 MCG/ACT nasal spray Place 1 spray into the nose daily. Allergies  16 g  3  . hydrochlorothiazide (HYDRODIURIL) 12.5 MG tablet Take 12.5 mg by mouth daily.      . hydrOXYzine (ATARAX/VISTARIL) 10 MG tablet Take 10 mg by mouth every 6 (six) hours as needed.       . lamoTRIgine (LAMICTAL) 100 MG tablet Take 100 mg by mouth daily.       [name] levothyroxine (LEVOXYL) 50 MCG tablet Take 50 mcg by mouth every morning.       . Linaclotide (LINZESS) 290 MCG CAPS Take 1 capsule by mouth daily.  30 capsule  3  .  LORazepam (ATIVAN) 1 MG tablet Take 1 mg by mouth every 8 (eight) hours as needed for anxiety.      . meclizine (ANTIVERT) 12.5 MG tablet Take 12.5 mg by mouth 3 (three) times daily as needed.      . methocarbamol (ROBAXIN) 500 MG tablet 500 mg daily.       . metoprolol (LOPRESSOR) 50 MG tablet Take 50 mg by  mouth 2 (two) times daily.      . mirtazapine (REMERON) 30 MG tablet Take 30 mg by mouth at bedtime.       . montelukast (SINGULAIR) 10 MG tablet Take 1 tablet (10 mg total) by mouth at bedtime.  30 tablet  5  . morphine (MSIR) 15 MG tablet Take 15 mg by mouth 2 (two) times daily as needed. For pain      . nystatin (MYCOSTATIN) powder Apply topically 2 (two) times daily.      . pregabalin (LYRICA) 75 MG capsule Take 75 mg by mouth daily.      . promethazine (PHENERGAN) 12.5 MG tablet Take 1 tablet (12.5 mg total) by mouth every 8 (eight) hours as needed for nausea.  30 tablet  1  . RABEprazole (ACIPHEX) 20 MG tablet Take 1 tablet (20 mg total) by mouth 2 (two) times daily.  60 tablet  11  . rosuvastatin (CRESTOR) 5 MG tablet Take 1 tablet (5 mg total) by mouth at bedtime.  30 tablet  11  . saxagliptin HCl (ONGLYZA) 2.5 MG TABS tablet Take 5 mg by mouth daily.      . sertraline (ZOLOFT) 100 MG tablet Take 150 mg by mouth at bedtime.       [name] thiothixene (NAVANE) 2 MG capsule Take 2 mg by mouth at bedtime.      . traZODone (DESYREL) 50 MG tablet as needed.       . Triamcinolone Acetonide (TRIAMCINOLONE 0.1 % CREAM : EUCERIN) CREA Apply twice daily to affected area(s) for rash and itch  1 each  0  . Vitamin D, Ergocalciferol, (DRISDOL) 50000 UNITS CAPS TAKE 1 CAPSULE BY MOUTH ONCE WEEKLY.  4 capsule  3   No current facility-administered medications for this visit.    LABS/IMAGING: No results found for this or any previous visit (from the past 48 hour(s)). No results found.  VITALS: BP 140/80  Pulse 57  Ht 5\' 2"  (1.575 m)  Wt 139 lb 9.6 oz (63.322 kg)  BMI 25.53 kg/m2  EXAM: General  appearance: alert and no distress Neck: no adenopathy, no carotid bruit, no JVD, supple, symmetrical, trachea midline and thyroid not enlarged, symmetric, no tenderness/mass/nodules Lungs: clear to auscultation bilaterally Heart: regular rate and rhythm, S1, S2 normal, no murmur, click, rub or gallop Abdomen: soft, non-tender; bowel sounds normal; no masses,  no organomegaly Extremities: extremities normal, atraumatic, no cyanosis or edema Pulses: 2+ and symmetric Skin: Skin color, texture, turgor normal. No rashes or lesions Neurologic: Mental status: Alert and oriented, speech is slow and somewhat rambling  EKG: Sinus bradycardia 50s  ASSESSMENT: 1. Hypertension-controlled 2. Hyperlipidemia 3. Diabetes type 2 4. Obstructive sleep apnea on CPAP 5. Depression/insomnia/bipolar disorder 6. Recent meningioma status post resection  PLAN: 1.   Ms. Doughten has a fairly well controlled blood pressure. She reports that she was asked to see me back again for an abnormal CT scan. The only study I could review was a CT of her abdomen looking for possible mesenteric ischemia as a cause of her diarrhea. That was fairly normal demonstrating no evidence of SMA obstruction. There was abdominal atherosclerosis in the aorta, which is not surprising given her cardiac risk factors. Overall her blood pressure control is pretty good. I would not recommend changing her medicines at this time. Her main issues are left shoulder pain for which she is to see Dr. Romeo Apple and problems with speech memory and frontal lobe symptoms also related to meningioma resection,  bipolar disorder or other neurologic difficulty. I understand she is seeing someone at [facility] neurologic.  We can see her back annually or sooner as necessary.  Chrystie Nose, MD, [facility] Attending Cardiologist [facility]  Jalyn Dutta C 08/15/2012, 1:23 PM

## 2012-08-15 NOTE — Patient Instructions (Signed)
Return annually for follow up

## 2012-08-16 ENCOUNTER — Other Ambulatory Visit: Payer: Self-pay | Admitting: Family Medicine

## 2012-08-19 ENCOUNTER — Ambulatory Visit (INDEPENDENT_AMBULATORY_CARE_PROVIDER_SITE_OTHER): Payer: Medicare Other | Admitting: Gastroenterology

## 2012-08-19 ENCOUNTER — Telehealth: Payer: Self-pay | Admitting: Family Medicine

## 2012-08-19 ENCOUNTER — Telehealth: Payer: Self-pay

## 2012-08-19 DIAGNOSIS — R109 Unspecified abdominal pain: Secondary | ICD-10-CM

## 2012-08-19 DIAGNOSIS — M25512 Pain in left shoulder: Secondary | ICD-10-CM | POA: Insufficient documentation

## 2012-08-19 DIAGNOSIS — R911 Solitary pulmonary nodule: Secondary | ICD-10-CM | POA: Insufficient documentation

## 2012-08-19 DIAGNOSIS — D649 Anemia, unspecified: Secondary | ICD-10-CM

## 2012-08-19 LAB — IFOBT (OCCULT BLOOD): IFOBT: POSITIVE

## 2012-08-19 NOTE — Assessment & Plan Note (Signed)
Currently satble, continues to be followed by psych

## 2012-08-19 NOTE — Assessment & Plan Note (Signed)
Controlled, no change in medication DASH diet and commitment to daily physical activity for a minimum of 30 minutes discussed and encouraged, as a part of hypertension management. The importance of attaining a healthy weight is also discussed.  

## 2012-08-19 NOTE — Assessment & Plan Note (Signed)
Controlled, no change in medication  

## 2012-08-19 NOTE — Assessment & Plan Note (Signed)
Triglycerides elevated, needs to reduce fried and ftty foods. No med change Hyperlipidemia:Low fat diet discussed and encouraged.

## 2012-08-19 NOTE — Assessment & Plan Note (Signed)
Deteriorated Patient counseled for approximately 5 minutes regarding the health risks of ongoing nicotine use, specifically all types of cancer, heart disease, stroke and respiratory failure. The options available for help with cessation ,the behavioral changes to assist the process, and the option to either gradully reduce usage  Or abruptly stop.is also discussed. Pt is also encouraged to set specific goals in number of cigarettes used daily, as well as to set a quit date.    

## 2012-08-19 NOTE — Telephone Encounter (Signed)
Just left Dr Myra Gianotti office in [place] and her BP was 190/83 (machine- not manually). States it has been running high for a few days. I recommended an OV but she said she couldn't use transportation again until the end of the week and wanted to see if there was any changes you wanted to make before hand.

## 2012-08-19 NOTE — Telephone Encounter (Signed)
Pls advise pt that her recent scan ordered by GI showed a lung nodule. She will need a f/u scan in August , I have entered the order, please schedule. Pls let her know she needs to stop smoking

## 2012-08-19 NOTE — Assessment & Plan Note (Signed)
New onset pain, will hold on further eval; and treatment at this time

## 2012-08-19 NOTE — Progress Notes (Signed)
Quick Note:  Heme positive. Please arrange a f/u appt with me to set up colonoscopy. ______

## 2012-08-19 NOTE — Assessment & Plan Note (Signed)
Over corrected, reduce glipizide dose

## 2012-08-20 NOTE — Telephone Encounter (Signed)
Based on the record in the office , I am unable to make any change in medication , since these are all good numbers. Needs OV Advise her to keep salt and canned food intake down please

## 2012-08-21 ENCOUNTER — Ambulatory Visit (INDEPENDENT_AMBULATORY_CARE_PROVIDER_SITE_OTHER): Payer: Medicare Other | Admitting: Orthopedic Surgery

## 2012-08-21 ENCOUNTER — Encounter: Payer: Self-pay | Admitting: Orthopedic Surgery

## 2012-08-21 ENCOUNTER — Ambulatory Visit (INDEPENDENT_AMBULATORY_CARE_PROVIDER_SITE_OTHER): Payer: Medicare Other

## 2012-08-21 VITALS — BP 120/58 | Ht 62.0 in | Wt 137.0 lb

## 2012-08-21 DIAGNOSIS — M25519 Pain in unspecified shoulder: Secondary | ICD-10-CM

## 2012-08-21 DIAGNOSIS — M25512 Pain in left shoulder: Secondary | ICD-10-CM

## 2012-08-21 NOTE — Telephone Encounter (Signed)
Left message with patient to call back to schedule appt.

## 2012-08-21 NOTE — Progress Notes (Signed)
Patient ID: [name], female   DOB: Feb 25, 1951, 62 y.o.   MRN: [id number] Chief Complaint  Patient presents with  . Arm Pain    Left arm pain, no injury.    62 year old female with a new problem of left shoulder pain radiating into the upper left arm. She complains of painful for elevation x5 weeks. Unrelieved by Tylenol. Chest throbbing 9/10 constant pain which is worse with attempts at forward elevation she has loss of motion she feels a catching sensation in the shoulder. Pain was of sudden onset with no trauma  She lists her review of systems numbness dizziness tremors and seizures frequency and urgency nausea vomiting and constipation shortness of breath wheezing blurred vision weight loss chills fatigue excessive thirst and temperature intolerance with easy bruising but she is on a blood thinner.  Past Medical History  Diagnosis Date  . Bipolar disorder   . CVA (cerebral infarction)   . Pancreatitis 2006    due to Depakote with normal EUS   . Osteoporosis   . Chronic back pain   . Diabetes mellitus   . Trigger finger   . Anxiety disorder   . Hypertension   . Migraines   . Diverticulosis     TCS 9/08 by Dr. Lina Sar for diarrhea . Bx for micro scopic colitis negative.   . Schatzki's ring     non critical / EGD with ED 8/2011with RMR  . S/P colonoscopy 4098,1191, 2011    left-sided diverticula, hx of simple adenomas . 2011, random bx negative for microscopic colitis  . Glaucoma   . Allergic rhinitis   . Hypothyroidism     thyroid goiter  . Anemia   . Blood transfusion   . GERD (gastroesophageal reflux disease)   . Stroke     left sided weakness  . Seizures     unknown etiology-on meds-last seizure was 3 yerars ago  . Anxiety   . Depression   . Metabolic encephalopathy 08/03/2011  . Sleep apnea   . Arthritis   . Gum symptoms     infection on antibiotic   Past Surgical History  Procedure Laterality Date  . Abdominal hysterectomy    . Cholecystectomy    .  Ovarian cyst removal    . Carpal tunnel release  07/22/04    left/ Dr. Romeo Apple   . Breast reduction surgery    . Bilateral cataract surgery    . Biopsy of thyroid gland  2009  . Surgical excision of 3 tumors from right thigh and right buttock  and left upper thigh  2010  . Back surgery  July 2012  . Spine surgery  09/29/2010    dr brooks  . Maloney dilation  12/29/2010    RMR;  . Esophagogastroduodenoscopy  12/29/2010    Rourk-Retained food in the esophagus and stomach, small hiatal hernia, status post Maloney dilation of the esophagus  . Craniotomy  11/23/2011    Procedure: CRANIOTOMY TUMOR EXCISION;  Surgeon: Hewitt Shorts, MD;  Location: [facility];  Service: Neurosurgery;  Laterality: N/A;  Craniotomy for tumor resection  . Brain surgery  11/2011    resection of meningioma   BP 120/58  Ht 5\' 2"  (1.575 m)  Wt 137 lb (62.143 kg)  BMI 25.05 kg/m2  She has a small frame she's awake alert and oriented x3 mood and affect are flat gait and station are somewhat labored  Her left shoulder has decreased range of motion but no instability she  has weakness but normal skin she has tenderness in the peri-acromial region  The right shoulder has full range of motion. Stable to anterior and posterior stress test rotator cuff strength is normal scans intact pulses normal there is no tenderness. She has good sensation  Left upper arm neurovascular exam is intact  Reflexes were 2+ and equal  Provocative test of the left shoulder indicated a positive Neer sign for impingement  X-rays show chronic cough changes no acute fracture  Impression Encounter Diagnosis  Name Primary?  . Pain in joint, shoulder region, left Yes    Recommend subacromial injection left shoulder  Subacromial Shoulder Injection Procedure Note  Pre-operative Diagnosis: left RC Syndrome  Post-operative Diagnosis: same  Indications: pain   Anesthesia: ethyl chloride   Procedure Details   Verbal consent  was obtained for the procedure. The shoulder was prepped withalcohol and the skin was anesthetized. A 20 gauge needle was advanced into the subacromial space through posterior approach without difficulty  The space was then injected with 3 ml 1% lidocaine and 1 ml of depomedrol. The injection site was cleansed with isopropyl alcohol and a dressing was applied.  Complications:  None; patient tolerated the procedure well.

## 2012-08-21 NOTE — Patient Instructions (Addendum)
You have received a steroid shot. 15% of patients experience increased pain at the injection site with in the next 24 hours. This is best treated with ice and tylenol extra strength 2 tabs every 8 hours. If you are still having pain please call the office.   Rotator Cuff Tendonitis  The rotator cuff is the collection of all the muscles and tendons (the supraspinatus, infraspinatus, subscapularis, and teres minor muscles and their tendons) that help your shoulder stay in place. This unit holds the head of the upper arm bone (humerus) in the cup (fossa) of the shoulder blade (scapula). Basically, it connects the arm to the shoulder. Tendinitis is a swelling and irritation of the tissue, called cord like structures (tendons) that connect muscle to bone. It usually is caused by overusing the joint involved. When the tissue surrounding a tendon (the synovium) becomes inflamed, it is called tenosynovitis. This also is often the result of overuse in people whose jobs require repetitive (over and over again) types of motion.

## 2012-08-22 ENCOUNTER — Ambulatory Visit: Payer: Medicare Other | Admitting: Orthopedic Surgery

## 2012-08-26 ENCOUNTER — Emergency Department (HOSPITAL_COMMUNITY)
Admission: EM | Admit: 2012-08-26 | Discharge: 2012-08-26 | Disposition: A | Discharge status: Home / Self Care | Payer: Medicare Other | Attending: Emergency Medicine | Admitting: Emergency Medicine

## 2012-08-26 ENCOUNTER — Encounter (HOSPITAL_COMMUNITY): Payer: Self-pay

## 2012-08-26 ENCOUNTER — Emergency Department (HOSPITAL_COMMUNITY): Payer: Medicare Other

## 2012-08-26 DIAGNOSIS — S0990XA Unspecified injury of head, initial encounter: Secondary | ICD-10-CM | POA: Insufficient documentation

## 2012-08-26 DIAGNOSIS — F329 Major depressive disorder, single episode, unspecified: Secondary | ICD-10-CM | POA: Insufficient documentation

## 2012-08-26 DIAGNOSIS — G8929 Other chronic pain: Secondary | ICD-10-CM | POA: Insufficient documentation

## 2012-08-26 DIAGNOSIS — Z8719 Personal history of other diseases of the digestive system: Secondary | ICD-10-CM | POA: Insufficient documentation

## 2012-08-26 DIAGNOSIS — F319 Bipolar disorder, unspecified: Secondary | ICD-10-CM | POA: Insufficient documentation

## 2012-08-26 DIAGNOSIS — Z8679 Personal history of other diseases of the circulatory system: Secondary | ICD-10-CM | POA: Insufficient documentation

## 2012-08-26 DIAGNOSIS — Z9889 Other specified postprocedural states: Secondary | ICD-10-CM | POA: Insufficient documentation

## 2012-08-26 DIAGNOSIS — Y9289 Other specified places as the place of occurrence of the external cause: Secondary | ICD-10-CM | POA: Insufficient documentation

## 2012-08-26 DIAGNOSIS — S0180XA Unspecified open wound of other part of head, initial encounter: Secondary | ICD-10-CM | POA: Insufficient documentation

## 2012-08-26 DIAGNOSIS — S0083XA Contusion of other part of head, initial encounter: Secondary | ICD-10-CM | POA: Insufficient documentation

## 2012-08-26 DIAGNOSIS — E039 Hypothyroidism, unspecified: Secondary | ICD-10-CM | POA: Insufficient documentation

## 2012-08-26 DIAGNOSIS — S0003XA Contusion of scalp, initial encounter: Secondary | ICD-10-CM | POA: Insufficient documentation

## 2012-08-26 DIAGNOSIS — I10 Essential (primary) hypertension: Secondary | ICD-10-CM | POA: Insufficient documentation

## 2012-08-26 DIAGNOSIS — W06XXXA Fall from bed, initial encounter: Secondary | ICD-10-CM | POA: Insufficient documentation

## 2012-08-26 DIAGNOSIS — Y9389 Activity, other specified: Secondary | ICD-10-CM | POA: Insufficient documentation

## 2012-08-26 DIAGNOSIS — F17200 Nicotine dependence, unspecified, uncomplicated: Secondary | ICD-10-CM | POA: Insufficient documentation

## 2012-08-26 DIAGNOSIS — Z8673 Personal history of transient ischemic attack (TIA), and cerebral infarction without residual deficits: Secondary | ICD-10-CM | POA: Insufficient documentation

## 2012-08-26 DIAGNOSIS — Z8669 Personal history of other diseases of the nervous system and sense organs: Secondary | ICD-10-CM | POA: Insufficient documentation

## 2012-08-26 DIAGNOSIS — Z79899 Other long term (current) drug therapy: Secondary | ICD-10-CM | POA: Insufficient documentation

## 2012-08-26 DIAGNOSIS — F3289 Other specified depressive episodes: Secondary | ICD-10-CM | POA: Insufficient documentation

## 2012-08-26 DIAGNOSIS — E119 Type 2 diabetes mellitus without complications: Secondary | ICD-10-CM | POA: Insufficient documentation

## 2012-08-26 DIAGNOSIS — IMO0002 Reserved for concepts with insufficient information to code with codable children: Secondary | ICD-10-CM | POA: Insufficient documentation

## 2012-08-26 DIAGNOSIS — Z8639 Personal history of other endocrine, nutritional and metabolic disease: Secondary | ICD-10-CM | POA: Insufficient documentation

## 2012-08-26 DIAGNOSIS — M810 Age-related osteoporosis without current pathological fracture: Secondary | ICD-10-CM | POA: Insufficient documentation

## 2012-08-26 DIAGNOSIS — F411 Generalized anxiety disorder: Secondary | ICD-10-CM | POA: Insufficient documentation

## 2012-08-26 DIAGNOSIS — Z8739 Personal history of other diseases of the musculoskeletal system and connective tissue: Secondary | ICD-10-CM | POA: Insufficient documentation

## 2012-08-26 DIAGNOSIS — Z862 Personal history of diseases of the blood and blood-forming organs and certain disorders involving the immune mechanism: Secondary | ICD-10-CM | POA: Insufficient documentation

## 2012-08-26 DIAGNOSIS — K219 Gastro-esophageal reflux disease without esophagitis: Secondary | ICD-10-CM | POA: Insufficient documentation

## 2012-08-26 LAB — POCT I-STAT, CHEM 8
Creatinine, Ser: 0.8 mg/dL (ref 0.50–1.10)
Glucose, Bld: 172 mg/dL — ABNORMAL HIGH (ref 70–99)
Hemoglobin: 12.9 g/dL (ref 12.0–15.0)
Sodium: 142 mEq/L (ref 135–145)
TCO2: 29 mmol/L (ref 0–100)

## 2012-08-26 LAB — CBC WITH DIFFERENTIAL/PLATELET
Basophils Relative: 0 % (ref 0–1)
Eosinophils Absolute: 0.3 10*3/uL (ref 0.0–0.7)
Hemoglobin: 11.2 g/dL — ABNORMAL LOW (ref 12.0–15.0)
MCH: 21.9 pg — ABNORMAL LOW (ref 26.0–34.0)
MCHC: 30.4 g/dL (ref 30.0–36.0)
Monocytes Absolute: 0.6 10*3/uL (ref 0.1–1.0)
Neutrophils Relative %: 52 % (ref 43–77)
Platelets: 224 10*3/uL (ref 150–400)

## 2012-08-26 MED ORDER — HYDROCODONE-ACETAMINOPHEN 5-325 MG PO TABS
2.0000 | ORAL_TABLET | Freq: Once | ORAL | Status: AC
  Administered 2012-08-26: 2 via ORAL
  Filled 2012-08-26: qty 2

## 2012-08-26 MED ORDER — HYDROCODONE-ACETAMINOPHEN 5-325 MG PO TABS: 2.0000 | ORAL_TABLET | ORAL | Status: DC | PRN

## 2012-08-26 NOTE — ED Notes (Signed)
Pt states she was turning over in her bed and fell out. States she feel into her bedside table. Has hematoma to right side of forehead. Denies LOC

## 2012-08-26 NOTE — ED Provider Notes (Signed)
History  This chart was scribed for [name] Roller, MD by Manuela Schwartz, ED scribe. This patient was seen in room APA08/APA08 and the patient's care was started at 1450.   CSN: [id number]  Arrival date & time 08/26/12  1450   First MD Initiated Contact with Patient 08/26/12 1627      Chief Complaint  Patient presents with  . Head Injury   Patient is a 62 y.o. female presenting with head injury. The history is limited by the condition of the patient (Level 5 caveat secondary to decreased responsiveness and somnolence ). No language interpreter was used.  Head Injury Location:  Frontal Mechanism of injury: fall   Risk factors: being elderly    Hx limited to decreased responsiveness and somnolence.  HPI Comments: Rheta Hemmelgarn [name] is a 61 y.o. female brought in by ambulance, who presents to the Emergency Department complaining of a fall after she rolled out of bed and hit her nightstand. She presents w/x2 hematomas to her right forehead and a small laceration to her forehead. She states hx of CVA. She c/o of no other injuries or pain.    Past Medical History  Diagnosis Date  . Bipolar disorder   . CVA (cerebral infarction)   . Pancreatitis 2006    due to Depakote with normal EUS   . Osteoporosis   . Chronic back pain   . Diabetes mellitus   . Trigger finger   . Anxiety disorder   . Hypertension   . Migraines   . Diverticulosis     TCS 9/08 by Dr. Lina Sar for diarrhea . Bx for micro scopic colitis negative.   . Schatzki's ring     non critical / EGD with ED 8/2011with RMR  . S/P colonoscopy 4098,1191, 2011    left-sided diverticula, hx of simple adenomas . 2011, random bx negative for microscopic colitis  . Glaucoma   . Allergic rhinitis   . Hypothyroidism     thyroid goiter  . Anemia   . Blood transfusion   . GERD (gastroesophageal reflux disease)   . Stroke     left sided weakness  . Seizures     unknown etiology-on meds-last seizure was 3 yerars ago  . Anxiety   .  Depression   . Metabolic encephalopathy 08/03/2011  . Sleep apnea   . Arthritis   . Gum symptoms     infection on antibiotic    Past Surgical History  Procedure Laterality Date  . Abdominal hysterectomy    . Cholecystectomy    . Ovarian cyst removal    . Carpal tunnel release  07/22/04    left/ Dr. Romeo Apple   . Breast reduction surgery    . Bilateral cataract surgery    . Biopsy of thyroid gland  2009  . Surgical excision of 3 tumors from right thigh and right buttock  and left upper thigh  2010  . Back surgery  July 2012  . Spine surgery  09/29/2010    dr brooks  . Maloney dilation  12/29/2010    RMR;  . Esophagogastroduodenoscopy  12/29/2010    Rourk-Retained food in the esophagus and stomach, small hiatal hernia, status post Maloney dilation of the esophagus  . Craniotomy  11/23/2011    Procedure: CRANIOTOMY TUMOR EXCISION;  Surgeon: Hewitt Shorts, MD;  Location: [facility];  Service: Neurosurgery;  Laterality: N/A;  Craniotomy for tumor resection  . Brain surgery  11/2011    resection of meningioma  Family History  Problem Relation Age of Onset  . Heart attack Mother   . Pneumonia Father   . Kidney failure Father   . Cancer Sister     pancreatic  . Diabetes Brother   . Hypertension Brother   . Colon cancer Neg Hx   . Anesthesia problems Neg Hx   . Hypotension Neg Hx   . Malignant hyperthermia Neg Hx   . Pseudochol deficiency Neg Hx     History  Substance Use Topics  . Smoking status: Current Every Day Smoker -- 0.25 packs/day for 7 years    Types: Cigarettes  . Smokeless tobacco: Never Used     Comment: started at 24 smoked 6 yrs and stopped. restarted 2 months ago  . Alcohol Use: No     Comment: 5 cigs/day     OB History   Grav Para Term Preterm Abortions TAB SAB Ect Mult Living                  Review of Systems  Unable to perform ROS Skin: Positive for wound (laceration and hematoma over right forehead).    Allergies  Cephalexin;  Milk-related compounds; Penicillins; and Phenazopyridine hcl  Home Medications   Current Outpatient Rx  Name  Route  Sig  Dispense  Refill  . ergocalciferol (VITAMIN D2) 50000 UNITS capsule   Oral   Take 50,000 Units by mouth once a week.         . montelukast (SINGULAIR) 10 MG tablet   Oral   Take 10 mg by mouth daily.         . benzonatate (TESSALON PERLES) 100 MG capsule   Oral   Take 1 capsule (100 mg total) by mouth every 6 (six) hours as needed for cough.   30 capsule   0   . clobetasol (TEMOVATE) 0.05 % external solution               . cloNIDine (CATAPRES) 0.3 MG tablet   Oral   Take 1 tablet (0.3 mg total) by mouth 2 (two) times daily.   60 tablet   11     Decreased dose clonidine effective 06/14/2012 Clo ...   . clopidogrel (PLAVIX) 75 MG tablet   Oral   Take 1 tablet (75 mg total) by mouth daily.   30 tablet   3   . cyclobenzaprine (FLEXERIL) 10 MG tablet   Oral   Take 10 mg by mouth 3 (three) times daily as needed for muscle spasms.         . cycloSPORINE (RESTASIS) 0.05 % ophthalmic emulsion      1 drop 2 (two) times daily.         . diclofenac sodium (VOLTAREN) 1 % GEL      Apply twice daily, as needed to leftleg for pain   100 g   1   . diphenoxylate-atropine (LOMOTIL) 2.5-0.025 MG per tablet   Oral   Take 1 tablet by mouth 4 (four) times daily as needed for diarrhea or loose stools. For loose stools   30 tablet   1   . fluticasone (FLONASE) 50 MCG/ACT nasal spray   Nasal   Place 1 spray into the nose daily. Allergies   16 g   3   . hydrochlorothiazide (HYDRODIURIL) 12.5 MG tablet   Oral   Take 12.5 mg by mouth daily.         [name] HYDROcodone-acetaminophen (NORCO/VICODIN) 5-325 MG per tablet  Oral   Take 2 tablets by mouth every 4 (four) hours as needed for pain.   10 tablet   0   . hydrOXYzine (ATARAX/VISTARIL) 10 MG tablet   Oral   Take 10 mg by mouth every 6 (six) hours as needed.          . lamoTRIgine  (LAMICTAL) 100 MG tablet   Oral   Take 100 mg by mouth daily.          [name] levothyroxine (LEVOXYL) 50 MCG tablet   Oral   Take 50 mcg by mouth every morning.          [name] Linaclotide (LINZESS) 290 MCG CAPS   Oral   Take 1 capsule by mouth daily.   30 capsule   3   . LORazepam (ATIVAN) 1 MG tablet   Oral   Take 1 mg by mouth every 8 (eight) hours as needed for anxiety.         . meclizine (ANTIVERT) 12.5 MG tablet   Oral   Take 12.5 mg by mouth 3 (three) times daily as needed.         . methocarbamol (ROBAXIN) 500 MG tablet      500 mg daily.          . metoprolol (LOPRESSOR) 50 MG tablet   Oral   Take 50 mg by mouth 2 (two) times daily.         . mirtazapine (REMERON) 30 MG tablet   Oral   Take 30 mg by mouth at bedtime.          [name] morphine (MSIR) 15 MG tablet   Oral   Take 15 mg by mouth 2 (two) times daily as needed. For pain         . nystatin (MYCOSTATIN) powder   Topical   Apply topically 2 (two) times daily.         . pregabalin (LYRICA) 75 MG capsule   Oral   Take 75 mg by mouth daily.         . promethazine (PHENERGAN) 12.5 MG tablet   Oral   Take 1 tablet (12.5 mg total) by mouth every 8 (eight) hours as needed for nausea.   30 tablet   1   . RABEprazole (ACIPHEX) 20 MG tablet   Oral   Take 1 tablet (20 mg total) by mouth 2 (two) times daily.   60 tablet   11   . rosuvastatin (CRESTOR) 5 MG tablet   Oral   Take 1 tablet (5 mg total) by mouth at bedtime.   30 tablet   11     Dose reduction effective 06/14/2012   . saxagliptin HCl (ONGLYZA) 2.5 MG TABS tablet   Oral   Take 5 mg by mouth daily.         . sertraline (ZOLOFT) 100 MG tablet   Oral   Take 150 mg by mouth at bedtime.          [name] thioridazine (MELLARIL) 10 MG tablet               . thiothixene (NAVANE) 2 MG capsule   Oral   Take 2 mg by mouth at bedtime.         . traZODone (DESYREL) 50 MG tablet      as needed.          . Triamcinolone  Acetonide (TRIAMCINOLONE 0.1 % CREAM : EUCERIN) CREA  Apply twice daily to affected area(s) for rash and itch   1 each   0     Triage Vitals: BP 130/56  Pulse 57  Temp(Src) 97.5 F (36.4 C) (Oral)  Resp 17  Ht 5' (1.524 m)  Wt 137 lb (62.143 kg)  BMI 26.76 kg/m2  SpO2 97%  Physical Exam  Nursing note and vitals reviewed. Constitutional: She is oriented to person, place, and time. She appears well-developed and well-nourished. No distress.  somnolent but arousable.   HENT:  Head: Normocephalic.  3 cm hematoma to her right forehead. Another hematoma just above her right eyebrow. 0.5 cm laceration to her forehead.     Eyes: Conjunctivae and EOM are normal. Pupils are equal, round, and reactive to light.  Normal pupillary exam  Neck: Neck supple. No tracheal deviation present.  Cardiovascular: Normal rate, regular rhythm and normal heart sounds.   No murmur heard. Pulmonary/Chest: Effort normal and breath sounds normal. No respiratory distress. She has no wheezes. She has no rales.  Abdominal: Soft. Bowel sounds are normal. She exhibits no distension. There is no tenderness.  Musculoskeletal: Normal range of motion. She exhibits no edema.  No deformities of extremities   Neurological: She is alert and oriented to person, place, and time.  Follows commands slowly, somnolent but arousable Sensation/motor intact    Skin: Skin is warm and dry.  Psychiatric: She has a normal mood and affect. Her behavior is normal.    ED Course  Procedures (including critical care time) DIAGNOSTIC STUDIES: Oxygen Saturation is 100% on room air, normal by my interpretation.    COORDINATION OF CARE: At 450 PM Discussed treatment plan with patient which includes head CT, blood work. Patient agrees.      1. Head injury, initial encounter   2. Traumatic hematoma of forehead, initial encounter       MDM  Pt has slight somnolence but now is more awake and interactive - she has  hematomas but no ICH or fractures.  I have discussed her findings with her - she will f/u with her PMD as needed.    Results for orders placed during the hospital encounter of 08/26/12  CBC WITH DIFFERENTIAL      Result Value Range   WBC 8.5  4.0 - 10.5 K/uL   RBC 5.12 (*) 3.87 - 5.11 MIL/uL   Hemoglobin 11.2 (*) 12.0 - 15.0 g/dL   HCT 09.8  11.9 - 14.7 %   MCV 72.1 (*) 78.0 - 100.0 fL   MCH 21.9 (*) 26.0 - 34.0 pg   MCHC 30.4  30.0 - 36.0 g/dL   RDW 82.9  56.2 - 13.0 %   Platelets 224  150 - 400 K/uL   Neutrophils Relative % 52  43 - 77 %   Lymphocytes Relative 38  12 - 46 %   Monocytes Relative 7  3 - 12 %   Eosinophils Relative 3  0 - 5 %   Basophils Relative 0  0 - 1 %   Neutro Abs 4.4  1.7 - 7.7 K/uL   Lymphs Abs 3.2  0.7 - 4.0 K/uL   Monocytes Absolute 0.6  0.1 - 1.0 K/uL   Eosinophils Absolute 0.3  0.0 - 0.7 K/uL   Basophils Absolute 0.0  0.0 - 0.1 K/uL   RBC Morphology POLYCHROMASIA PRESENT    POCT I-STAT, CHEM 8      Result Value Range   Sodium 142  135 - 145 mEq/L   Potassium  3.5  3.5 - 5.1 mEq/L   Chloride 104  96 - 112 mEq/L   BUN 32 (*) 6 - 23 mg/dL   Creatinine, Ser 4.09  0.50 - 1.10 mg/dL   Glucose, Bld 811 (*) 70 - 99 mg/dL   Calcium, Ion 9.14  7.82 - 1.30 mmol/L   TCO2 29  0 - 100 mmol/L   Hemoglobin 12.9  12.0 - 15.0 g/dL   HCT 95.6  21.3 - 08.6 %      *Note: Due to a large number of results for the requested time period, some results have not been displayed. A complete set of results can be found in Results Review.   Ct Head Wo Contrast  08/26/2012   *RADIOLOGY REPORT*  Clinical Data:  Trauma, injury, neck pain, depressed mental status, history of CVA  CT HEAD WITHOUT CONTRAST CT CERVICAL SPINE WITHOUT CONTRAST  Technique:  Multidetector CT imaging of the head and cervical spine was performed following the standard protocol without intravenous contrast.  Multiplanar CT image reconstructions of the cervical spine were also generated.  Comparison:   06/11/2012; 03/09/2012; head and C-spine CT - 02/15/2012  CT HEAD  Findings:  There is soft tissue swelling about the high right forehead (images 22-27).  This finding is without associated displaced calvarial fracture or acute intraparenchymal or extra-axial hemorrhage.  Grossly stable sequela of prior frontal craniotomy.  Surgical clips are again seen within the ventral aspect of the medial midline falx.  There is a minimal amount of apparent encephalomalacia involving the medial aspect of the bilateral frontal lobes.  The gray-white differentiation is otherwise well maintained.  No CT evidence of acute large territory infarct.  Vascular calcifications within the bilateral carotid siphons.  Post bilateral cataract surgery.  IMPRESSION: 1.  Soft tissue swelling about the right forehead without associated displaced calvarial fracture or acute intra parenchymal or extra-axial hemorrhage. 2. Stable postsurgical change of the frontal cortex and frontal lobe as above.  CT CERVICAL SPINE  Findings:  C1 to the superior endplate of T2 is imaged.    There is a mild scoliotic curvature of the cervical spine. The bilateral facets are normally aligned.  No anterolisthesis or retrolisthesis.  The dens is normally positioned and the lateral masses of C1.  Normal atlanto-axial articulation.  There is mild degenerative change of the atlanto-dental articulation.  No fracture or static subluxation of the cervical spine.  Cervical vertebral body heights are preserved.  Prevertebral soft tissues are normal.  There is mild to moderate multilevel cervical spine DDD, likely worse at C5 - 6 and C6 - C7 with disc space height loss, end plate irregularity and sclerosis.  Limited visualization of the lung apices are normal.  There is a suspected approximate 1.5 x 1.6 cm hypoattenuating nodule in the left lobe the thyroid (image 78, series six).  IMPRESSION: 1.  No fracture or static subluxation of the cervical spine.  2.  Mild to moderate  multilevel cervical spine DDD. 3.  Possible 1.6 cm nodule within the left lobe of the thyroid. Further evaluation with dedicated thyroid ultrasound may be performed as clinically indicated.   Original Report Authenticated By: Tacey Ruiz, MD   Ct Cervical Spine Wo Contrast  08/26/2012   *RADIOLOGY REPORT*  Clinical Data:  Trauma, injury, neck pain, depressed mental status, history of CVA  CT HEAD WITHOUT CONTRAST CT CERVICAL SPINE WITHOUT CONTRAST  Technique:  Multidetector CT imaging of the head and cervical spine was performed following the standard  protocol without intravenous contrast.  Multiplanar CT image reconstructions of the cervical spine were also generated.  Comparison:  06/11/2012; 03/09/2012; head and C-spine CT - 02/15/2012  CT HEAD  Findings:  There is soft tissue swelling about the high right forehead (images 22-27).  This finding is without associated displaced calvarial fracture or acute intraparenchymal or extra-axial hemorrhage.  Grossly stable sequela of prior frontal craniotomy.  Surgical clips are again seen within the ventral aspect of the medial midline falx.  There is a minimal amount of apparent encephalomalacia involving the medial aspect of the bilateral frontal lobes.  The gray-white differentiation is otherwise well maintained.  No CT evidence of acute large territory infarct.  Vascular calcifications within the bilateral carotid siphons.  Post bilateral cataract surgery.  IMPRESSION: 1.  Soft tissue swelling about the right forehead without associated displaced calvarial fracture or acute intra parenchymal or extra-axial hemorrhage. 2. Stable postsurgical change of the frontal cortex and frontal lobe as above.  CT CERVICAL SPINE  Findings:  C1 to the superior endplate of T2 is imaged.    There is a mild scoliotic curvature of the cervical spine. The bilateral facets are normally aligned.  No anterolisthesis or retrolisthesis.  The dens is normally positioned and the lateral  masses of C1.  Normal atlanto-axial articulation.  There is mild degenerative change of the atlanto-dental articulation.  No fracture or static subluxation of the cervical spine.  Cervical vertebral body heights are preserved.  Prevertebral soft tissues are normal.  There is mild to moderate multilevel cervical spine DDD, likely worse at C5 - 6 and C6 - C7 with disc space height loss, end plate irregularity and sclerosis.  Limited visualization of the lung apices are normal.  There is a suspected approximate 1.5 x 1.6 cm hypoattenuating nodule in the left lobe the thyroid (image 78, series six).  IMPRESSION: 1.  No fracture or static subluxation of the cervical spine.  2.  Mild to moderate multilevel cervical spine DDD. 3.  Possible 1.6 cm nodule within the left lobe of the thyroid. Further evaluation with dedicated thyroid ultrasound may be performed as clinically indicated.   Original Report Authenticated By: Tacey Ruiz, MD   Dg Shoulder Left  08/21/2012   Radiology report left shoulder x-ray 2 views taken for left shoulder pain  The acromion appears to be a type I.  A.c. joint degenerative changes moderate  Greater tuberosity sclerosis moderate, glenohumeral joint normal congruent  no spur  Impression a.c. joint arthritis Chronic signs of rotator cuff tendinosis    I personally performed the services described in this documentation, which was scribed in my presence. The recorded information has been reviewed and is accurate.           [name] Roller, MD 08/26/12 [phone]

## 2012-08-26 NOTE — ED Notes (Signed)
[name] [phone], pt sitter. Please call with patient disposition, will provide d/c transportation.

## 2012-08-27 NOTE — Telephone Encounter (Signed)
Patient aware.

## 2012-09-03 ENCOUNTER — Ambulatory Visit (INDEPENDENT_AMBULATORY_CARE_PROVIDER_SITE_OTHER): Payer: Medicare Other | Admitting: Gastroenterology

## 2012-09-03 ENCOUNTER — Other Ambulatory Visit: Payer: Self-pay | Admitting: Internal Medicine

## 2012-09-03 ENCOUNTER — Encounter: Payer: Self-pay | Admitting: Gastroenterology

## 2012-09-03 VITALS — BP 145/75 | HR 70 | Temp 98.1°F | Ht 60.0 in | Wt 136.8 lb

## 2012-09-03 DIAGNOSIS — R1319 Other dysphagia: Secondary | ICD-10-CM

## 2012-09-03 DIAGNOSIS — K3184 Gastroparesis: Secondary | ICD-10-CM

## 2012-09-03 DIAGNOSIS — R109 Unspecified abdominal pain: Secondary | ICD-10-CM

## 2012-09-03 DIAGNOSIS — R1310 Dysphagia, unspecified: Secondary | ICD-10-CM

## 2012-09-03 DIAGNOSIS — K5900 Constipation, unspecified: Secondary | ICD-10-CM

## 2012-09-03 MED ORDER — PEG 3350-KCL-NA BICARB-NACL 420 G PO SOLR: 4000.0000 mL | ORAL | Status: DC

## 2012-09-03 NOTE — Patient Instructions (Addendum)
We have set you up for a colonoscopy, upper endoscopy and dilation as needed in the near future.  Continue Aciphex twice a day.
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
## 2012-09-04 ENCOUNTER — Other Ambulatory Visit: Payer: Self-pay | Admitting: Internal Medicine

## 2012-09-05 NOTE — Progress Notes (Signed)
Cc PCP 

## 2012-09-05 NOTE — Assessment & Plan Note (Addendum)
62 year old female with history of chronic abdominal pain, with CTA negative for mesenteric ischemia recently. Noted to be heme positive with slight drift in ferritin (down to 77 from 135), chronic anemia stable. Her last colonoscopy was in 2011 with simple adenomas, and last EGD Oct 2012 with small hiatal hernia, s/p empiric dilation. With her weight loss of around 20 lbs, persistent LUQ pain and lower abdominal pain, heme positive stool, would recommend repeat lower and upper GI evaluation with dilation as appropriate. It is likely that constipation is playing a significant role in her symptoms, and it is reassuring that we are not dealing with an ischemia picture.   Proceed with TCS/EGD/ED with Dr. Jena Gauss in near future: the risks, benefits, and alternatives have been discussed with the patient in detail. The patient states understanding and desires to proceed. Phenergan 12.5 mg IV on call due to polypharmacy Continue Linzess 290 mcg Consider evaluation for pelvic floor dysfunction in future May consider pain management referral

## 2012-09-05 NOTE — Assessment & Plan Note (Signed)
Last empiric dilation in 2012. BPE in May 2013 without evidence of stricture, prominent tertiary contractions. EGD/ED as outlined already. Dysphagia may be secondary to uncontrolled GERD, motility disorder.

## 2012-09-05 NOTE — Assessment & Plan Note (Signed)
Linzess 290 mcg daily with continued bouts of constipation. States "not moving down". Question if patient is actually taking this daily, as she has multiple medications on her list. Concern for compliance but unable to rule out underlying pelvic floor dysfunction.

## 2012-09-05 NOTE — Assessment & Plan Note (Addendum)
With worsening nausea, no vomiting. Weight loss noted as well. On multiple medications to include Aciphex BID, prn low-dose Phenergan. Consider Marinol in future with weaning off Phenergan.

## 2012-09-09 ENCOUNTER — Ambulatory Visit: Payer: Self-pay | Admitting: Nurse Practitioner

## 2012-09-09 ENCOUNTER — Telehealth: Payer: Self-pay | Admitting: Family Medicine

## 2012-09-09 NOTE — Telephone Encounter (Signed)
pls verify with the pharmacy what , if any medication she is taking for blood sugar, states onglyza, she is still confused, talking about med dr Fransico Him prescribed and that her nurse Britt Boozer, threw away her medication also. I ned the info so I can see if I can sign off  On diabetic showes, if not ion meds will not be able to do so and will also notify the podiatrist who sent in recommendation

## 2012-09-10 ENCOUNTER — Telehealth: Payer: Self-pay | Admitting: Family Medicine

## 2012-09-12 ENCOUNTER — Telehealth: Payer: Self-pay | Admitting: Family Medicine

## 2012-09-12 NOTE — Telephone Encounter (Signed)
See next telephone msg   

## 2012-09-12 NOTE — Telephone Encounter (Signed)
Called and left msg for patient to try otc sudafed and loratadine.  To call back for office visit if symptoms do not resolve.

## 2012-09-16 ENCOUNTER — Telehealth: Payer: Self-pay | Admitting: Family Medicine

## 2012-09-16 NOTE — Telephone Encounter (Signed)
Patient is not currently on any diabetic meds.

## 2012-09-16 NOTE — Telephone Encounter (Signed)
Will further address at [facility] tomorrow

## 2012-09-16 NOTE — Telephone Encounter (Signed)
appt made for 1:15 tomorrow

## 2012-09-17 ENCOUNTER — Ambulatory Visit (INDEPENDENT_AMBULATORY_CARE_PROVIDER_SITE_OTHER): Payer: Medicare Other | Admitting: Family Medicine

## 2012-09-17 ENCOUNTER — Encounter: Payer: Self-pay | Admitting: Family Medicine

## 2012-09-17 ENCOUNTER — Other Ambulatory Visit: Payer: Self-pay | Admitting: Family Medicine

## 2012-09-17 VITALS — BP 138/66 | HR 70 | Resp 18 | Ht 62.0 in | Wt 140.0 lb

## 2012-09-17 DIAGNOSIS — M25519 Pain in unspecified shoulder: Secondary | ICD-10-CM

## 2012-09-17 DIAGNOSIS — R040 Epistaxis: Secondary | ICD-10-CM | POA: Insufficient documentation

## 2012-09-17 DIAGNOSIS — F17200 Nicotine dependence, unspecified, uncomplicated: Secondary | ICD-10-CM

## 2012-09-17 DIAGNOSIS — M25512 Pain in left shoulder: Secondary | ICD-10-CM

## 2012-09-17 DIAGNOSIS — R7309 Other abnormal glucose: Secondary | ICD-10-CM

## 2012-09-17 DIAGNOSIS — I10 Essential (primary) hypertension: Secondary | ICD-10-CM

## 2012-09-17 DIAGNOSIS — R911 Solitary pulmonary nodule: Secondary | ICD-10-CM

## 2012-09-17 DIAGNOSIS — E785 Hyperlipidemia, unspecified: Secondary | ICD-10-CM

## 2012-09-17 DIAGNOSIS — R7302 Impaired glucose tolerance (oral): Secondary | ICD-10-CM

## 2012-09-17 DIAGNOSIS — M549 Dorsalgia, unspecified: Secondary | ICD-10-CM

## 2012-09-17 MED ORDER — NICOTINE 10 MG IN INHA: 1.0000 | RESPIRATORY_TRACT | Status: DC | PRN

## 2012-09-17 NOTE — Progress Notes (Signed)
  Subjective:    Patient ID: [name], female    DOB: 1950/05/04, 62 y.o.   MRN: [id number]  HPI The PT is here for follow up and re-evaluation of chronic medical conditions, medication management and review of any available recent lab and radiology data.  Preventive health is updated, specifically  Cancer screening and Immunization.   Has colonoscopy next week, 1 year early, blood in stool, weight loss. Seen ortho re left arm pain, despite injection, still hurts will need tocall back for re eval Seeing dermatology re itchy lesion in scalp also spots on hands not itching The PT denies any adverse reactions to current medications since the last visit. She c/o single peisode of epistaxis and is concerned about this , it is unilateral from right nostril, denies picking her nose or sinus pressure or drainage, she will be referred to [facility] for this       Review of Systems See HPI Denies recent fever or chills. Denies sinus pressure, nasal congestion, ear pain or sore throat. Denies chest congestion, productive cough or wheezing. Denies chest pains, palpitations and leg swelling Denies abdominal pain, nausea, vomiting,diarrhea or constipation.   Denies dysuria, frequency, hesitancy or incontinence. Chronic  joint pain, and limitation in mobility. C/o increase frequency of  Headaches,denies  seizuresDenies uncontrolled  depression, anxiety or insomnia. Denies skin break down or rash.        Objective:   Physical Exam  Patient alert and oriented and in no cardiopulmonary distress.  HEENT: No facial asymmetry, EOMI, no sinus tenderness,  oropharynx pink and moist.  Neck supple no adenopathy.TM clear. No evidence of bleeding from either nostriil at this time  Chest: Clear to auscultation bilaterally.Decreased though adequate air entry  CVS: S1, S2 no murmurs, no S3.  ABD: Soft non tender. Bowel sounds normal.  Ext: No edema  MS: Adequate though reduced  ROM spine, decreased ROM  left shoulder.  Skin: Intact, no ulcerations or rash noted.  Psych: Good eye contact, normal affect. Memory intact not anxious or depressed appearing.  CNS: CN 2-12 intact, power, tone and sensation normal throughout.       Assessment & Plan:

## 2012-09-17 NOTE — Patient Instructions (Addendum)
F/u as before  You are referred for chest CT scan in October, to follow up lung nodule.  I am thankful you have decided to STOP smoking, once you are using the nicotrol inhaler which I prescribed, DO NOT USE ANY CIGARETTES   Since you are no longer a diabetic, I am unable to authorize diabetic shoes for you. You still need to continue to stay away from sugar and extra sweet , so you do not become diabetic again   All the best with colonoscopy next week  You are referred to Dr Romeo Apple in August re ongoing left shoulder and arm pain  Fasting lipid, cmp and EGFr, HBA1C and TSH prior to next visit  Blood pressure is excellent, discuss headaches with Dr Jeanne Ivan will be referred to Dr Suszanne Conners about nose bleeds

## 2012-09-19 ENCOUNTER — Other Ambulatory Visit: Payer: Self-pay | Admitting: Family Medicine

## 2012-09-20 ENCOUNTER — Telehealth (INDEPENDENT_AMBULATORY_CARE_PROVIDER_SITE_OTHER): Payer: Medicare Other

## 2012-09-20 ENCOUNTER — Other Ambulatory Visit: Payer: Self-pay | Admitting: Family Medicine

## 2012-09-20 DIAGNOSIS — N3000 Acute cystitis without hematuria: Secondary | ICD-10-CM

## 2012-09-20 LAB — POCT URINALYSIS DIPSTICK
Bilirubin, UA: NEGATIVE
Blood, UA: NEGATIVE
Glucose, UA: NEGATIVE
Ketones, UA: NEGATIVE
Spec Grav, UA: >=1.03

## 2012-09-20 MED ORDER — CIPROFLOXACIN HCL 500 MG PO TABS: 500.0000 mg | ORAL_TABLET | Freq: Two times a day (BID) | ORAL | Status: DC

## 2012-09-20 NOTE — Telephone Encounter (Signed)
Abn urine , cipro prescribed, culture sent

## 2012-09-20 NOTE — Telephone Encounter (Signed)
UA abnormal , pls send for c/s and I will send in antibiotic for 5 days, thanks

## 2012-09-22 ENCOUNTER — Telehealth: Payer: Self-pay | Admitting: Family Medicine

## 2012-09-22 NOTE — Telephone Encounter (Signed)
[name] needs her chest CT in October, not before, pls, she had a scan in April which ahs moved it further out to October. Questions, pls ask Reason for scan lung nodule and nicotine use

## 2012-09-23 ENCOUNTER — Other Ambulatory Visit: Payer: Self-pay | Admitting: Internal Medicine

## 2012-09-23 MED ORDER — PEG 3350-KCL-NA BICARB-NACL 420 G PO SOLR: 4000.0000 mL | ORAL | Status: DC

## 2012-09-23 NOTE — Telephone Encounter (Signed)
Patient aware.

## 2012-09-24 ENCOUNTER — Other Ambulatory Visit: Payer: Self-pay | Admitting: Family Medicine

## 2012-09-24 ENCOUNTER — Ambulatory Visit (HOSPITAL_COMMUNITY): Payer: Medicare Other

## 2012-09-24 ENCOUNTER — Telehealth: Payer: Self-pay | Admitting: Family Medicine

## 2012-09-24 LAB — URINE CULTURE: Colony Count: >=100000

## 2012-09-24 MED ORDER — RIFAMPIN 300 MG PO CAPS: 300.0000 mg | ORAL_CAPSULE | Freq: Two times a day (BID) | ORAL | Status: AC

## 2012-09-24 MED ORDER — NITROFURANTOIN MONOHYD MACRO 100 MG PO CAPS: 100.0000 mg | ORAL_CAPSULE | Freq: Two times a day (BID) | ORAL | Status: AC

## 2012-09-24 NOTE — Telephone Encounter (Signed)
Patient states she didn't need anything. I spoke to her this am already

## 2012-09-25 ENCOUNTER — Ambulatory Visit (HOSPITAL_COMMUNITY)
Admission: RE | Admit: 2012-09-25 | Discharge: 2012-09-25 | Disposition: A | Discharge status: Home / Self Care | Payer: Medicare Other | Source: Ambulatory Visit | Attending: Internal Medicine | Admitting: Internal Medicine

## 2012-09-25 ENCOUNTER — Encounter (HOSPITAL_COMMUNITY): Payer: Self-pay | Admitting: *Deleted

## 2012-09-25 ENCOUNTER — Encounter (HOSPITAL_COMMUNITY)
Admission: RE | Disposition: A | Discharge status: Home / Self Care | Payer: Self-pay | Source: Ambulatory Visit | Attending: Internal Medicine

## 2012-09-25 DIAGNOSIS — E119 Type 2 diabetes mellitus without complications: Secondary | ICD-10-CM | POA: Insufficient documentation

## 2012-09-25 DIAGNOSIS — R1310 Dysphagia, unspecified: Secondary | ICD-10-CM

## 2012-09-25 DIAGNOSIS — I10 Essential (primary) hypertension: Secondary | ICD-10-CM | POA: Insufficient documentation

## 2012-09-25 DIAGNOSIS — K5730 Diverticulosis of large intestine without perforation or abscess without bleeding: Secondary | ICD-10-CM | POA: Insufficient documentation

## 2012-09-25 DIAGNOSIS — K449 Diaphragmatic hernia without obstruction or gangrene: Secondary | ICD-10-CM

## 2012-09-25 DIAGNOSIS — R21 Rash and other nonspecific skin eruption: Secondary | ICD-10-CM

## 2012-09-25 DIAGNOSIS — R195 Other fecal abnormalities: Secondary | ICD-10-CM | POA: Insufficient documentation

## 2012-09-25 DIAGNOSIS — Z860100 Personal history of colon polyps, unspecified: Secondary | ICD-10-CM | POA: Insufficient documentation

## 2012-09-25 DIAGNOSIS — R109 Unspecified abdominal pain: Secondary | ICD-10-CM

## 2012-09-25 DIAGNOSIS — R1319 Other dysphagia: Secondary | ICD-10-CM

## 2012-09-25 HISTORY — PX: ESOPHAGOGASTRODUODENOSCOPY: SHX5428

## 2012-09-25 HISTORY — PX: COLONOSCOPY: SHX5424

## 2012-09-25 SURGERY — COLONOSCOPY
Anesthesia: Moderate Sedation

## 2012-09-25 MED ORDER — MEPERIDINE HCL 100 MG/ML IJ SOLN
INTRAMUSCULAR | Status: AC
  Filled 2012-09-25: qty 1

## 2012-09-25 MED ORDER — BUTAMBEN-TETRACAINE-BENZOCAINE 2-2-14 % EX AERO
INHALATION_SPRAY | CUTANEOUS | Status: DC | PRN
  Administered 2012-09-25: 2 via TOPICAL

## 2012-09-25 MED ORDER — ONDANSETRON HCL 4 MG/2ML IJ SOLN
INTRAMUSCULAR | Status: AC
  Filled 2012-09-25: qty 2

## 2012-09-25 MED ORDER — ONDANSETRON HCL 4 MG/2ML IJ SOLN
INTRAMUSCULAR | Status: DC | PRN
  Administered 2012-09-25: 4 mg via INTRAVENOUS

## 2012-09-25 MED ORDER — PROMETHAZINE HCL 25 MG/ML IJ SOLN
INTRAMUSCULAR | Status: AC
  Filled 2012-09-25: qty 1

## 2012-09-25 MED ORDER — MIDAZOLAM HCL 5 MG/5ML IJ SOLN
INTRAMUSCULAR | Status: DC | PRN
  Administered 2012-09-25 (×2): 1 mg via INTRAVENOUS
  Administered 2012-09-25: 2 mg via INTRAVENOUS
  Administered 2012-09-25: 1 mg via INTRAVENOUS
  Administered 2012-09-25: 2 mg via INTRAVENOUS

## 2012-09-25 MED ORDER — SODIUM CHLORIDE 0.9 % IV SOLN
INTRAVENOUS | Status: DC | PRN
  Administered 2012-09-25: 1000 mL via INTRAMUSCULAR

## 2012-09-25 MED ORDER — MIDAZOLAM HCL 5 MG/5ML IJ SOLN
INTRAMUSCULAR | Status: AC
  Filled 2012-09-25: qty 10

## 2012-09-25 MED ORDER — SODIUM CHLORIDE 0.9 % IJ SOLN
INTRAMUSCULAR | Status: AC
  Filled 2012-09-25: qty 10

## 2012-09-25 MED ORDER — PROMETHAZINE HCL 25 MG/ML IJ SOLN
12.5000 mg | Freq: Once | INTRAMUSCULAR | Status: AC
  Administered 2012-09-25: 12.5 mg via INTRAVENOUS

## 2012-09-25 MED ORDER — MEPERIDINE HCL 100 MG/ML IJ SOLN
INTRAMUSCULAR | Status: DC | PRN
Start: 2012-09-25 — End: 2012-09-25
  Administered 2012-09-25 (×2): 50 mg via INTRAVENOUS

## 2012-09-25 MED ORDER — SODIUM CHLORIDE 0.9 % IV SOLN
INTRAVENOUS | Status: DC
  Administered 2012-09-25: 1000 mL via INTRAVENOUS

## 2012-09-25 MED ORDER — STERILE WATER FOR IRRIGATION IR SOLN
Status: DC | PRN
  Administered 2012-09-25: 11:00:00
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
## 2012-09-25 NOTE — Op Note (Addendum)
[facility] [address] [place], [address]   COLONOSCOPY PROCEDURE REPORT  PATIENT: [name], [name]  MR#:         [id number] BIRTHDATE: August 20, 1950 , 62  yrs. old GENDER: Female ENDOSCOPIST: R.  Roetta Sessions, MD FACP FACG REFERRED BY:  Syliva Overman, M.D. PROCEDURE DATE:  09/25/2012 PROCEDURE:     Diagnostic colonoscopy  INDICATIONS: Hemoccult-positive stool; history colonic adenoma  INFORMED CONSENT:  The risks, benefits, alternatives and imponderables including but not limited to bleeding, perforation as well as the possibility of a missed lesion have been reviewed.  The potential for biopsy, lesion removal, etc. have also been discussed.  Questions have been answered.  All parties agreeable. Please see the history and physical in the medical record for more information.  MEDICATIONS: Versed 7 mg IV and Demerol 100 mg IV in divided doses. Zofran 4 mg IV  DESCRIPTION OF PROCEDURE:  After a digital rectal exam was performed, the EC-3890Li ([id number])  colonoscope was advanced from the anus through the rectum and colon to the area of the cecum, ileocecal valve and appendiceal orifice.  The cecum was deeply intubated.  These structures were well-seen and photographed for the record.  From the level of the cecum and ileocecal valve, the scope was slowly and cautiously withdrawn.  The mucosal surfaces were carefully surveyed utilizing scope tip deflection to facilitate fold flattening as needed.  The scope was pulled down into the rectum where a thorough examination  was performed.    FINDINGS:  Adequate preparation. Normal-appearing rectal mucosa appeared rectal vault small. Unable to retroflex. Scattered pancolonic diverticula; the remainder of colonic mucosa appeared normal aside from a single diminutive rectosigmoid polyp.  THERAPEUTIC / DIAGNOSTIC MANEUVERS PERFORMED:  The above-mentioned polyp was cold biopsied/removed  COMPLICATIONS: None CECAL  WITHDRAWAL TIME:  10 minutes  IMPRESSION:  Colonic diverticulosis.  colonic polyp-removed as described above  RECOMMENDATIONS:  Followup on pathology.  Continue Linzess for constipation. Office followup with [name] in 6 weeks to reassess.   _______________________________ eSigned:  R. Roetta Sessions, MD [facility] 10/02/2012 5:32 PM Revised: 10/02/2012 5:32 PM  CC:

## 2012-09-25 NOTE — Op Note (Signed)
[facility] [address] [place], [address]   ENDOSCOPY PROCEDURE REPORT  PATIENT: [name], [name]  MR#: [id number] BIRTHDATE: [birth year] , 62  yrs. old GENDER: Female ENDOSCOPIST: R.  Roetta Sessions, MD FACP FACG REFERRED BY:  Syliva Overman, M.D. PROCEDURE DATE:  09/25/2012 PROCEDURE:     EGD with Elease Hashimoto dilation  INDICATIONS:    Recurrent esophageal Dysphagia  INFORMED CONSENT:   The risks, benefits, limitations, alternatives and imponderables have been discussed.  The potential for biopsy, esophogeal dilation, etc. have also been reviewed.  Questions have been answered.  All parties agreeable.  Please see the history and physical in the medical record for more information.  MEDICATIONS:    Versed 4 mg  and Demerol 100 mg IV in divided doses. Zofran 4 mg IV. Cetacaine spray.  DESCRIPTION OF PROCEDURE:   The EG-[id number] ([id number]) and EC-3890Li ([id number])  endoscope was introduced through the mouth and advanced to the second portion of the duodenum without difficulty or limitations.  The mucosal surfaces were surveyed very carefully during advancement of the scope and upon withdrawal.  Retroflexion view of the proximal stomach and esophagogastric junction was performed.      FINDINGS:   Somewhat atonic, baggy appearing esophagus. Esophagus. Esophagus widely patent throughout its course. Stomach empty. Small hiatal hernia. Normal gastric mucosa. Patent pylorus. Normal first and second portion of the duodenum.  THERAPEUTIC / DIAGNOSTIC MANEUVERS PERFORMED:  A 56 French Maloney dilator was passed to full insertion easily. A look back revealed no apparent complication related to passage of the dilator.   COMPLICATIONS:  None  IMPRESSION:  Somewhat atonic baggy esophagus status post Maloney dilation. Hiatal hernia.  RECOMMENDATIONS:  Continue AcipHex. Office followup with [name] in 3 months.    _______________________________ R. Roetta Sessions, MD FACP  [facility] eSigned:  R. Roetta Sessions, MD FACP [facility] 09/25/2012 11:59 AM     CC:

## 2012-09-25 NOTE — Interval H&P Note (Signed)
History and Physical Interval Note:  09/25/2012 10:54 AM  [name]  has presented today for surgery, with the diagnosis of DYSPHAGIA  AND ABDOMINAL PAIN  The various methods of treatment have been discussed with the patient and family. After consideration of risks, benefits and other options for treatment, the patient has consented to  Procedure(s) with comments: COLONOSCOPY (N/A) - 10:15 ESOPHAGOGASTRODUODENOSCOPY (EGD) WITH ESOPHAGEAL DILATION (N/A) as a surgical intervention .  The patient's history has been reviewed, patient examined, no change in status, stable for surgery.  I have reviewed the patient's chart and labs.  Questions were answered to the patient's satisfaction.     [name]  EGD with esophageal dilation and colonoscopy today per plan.  The risks, benefits, limitations, imponderables and alternatives regarding both EGD and colonoscopy have been reviewed with the patient. Questions have been answered. All parties agreeable.

## 2012-09-26 ENCOUNTER — Ambulatory Visit (INDEPENDENT_AMBULATORY_CARE_PROVIDER_SITE_OTHER): Payer: Medicare Other | Admitting: Internal Medicine

## 2012-09-26 ENCOUNTER — Other Ambulatory Visit (HOSPITAL_COMMUNITY): Payer: Medicare Other

## 2012-09-26 ENCOUNTER — Telehealth: Payer: Self-pay | Admitting: Internal Medicine

## 2012-09-26 ENCOUNTER — Encounter (HOSPITAL_COMMUNITY): Payer: Self-pay | Admitting: Internal Medicine

## 2012-09-26 VITALS — BP 110/64 | HR 60 | Ht 60.5 in | Wt 140.8 lb

## 2012-09-26 DIAGNOSIS — F17200 Nicotine dependence, unspecified, uncomplicated: Secondary | ICD-10-CM

## 2012-09-26 DIAGNOSIS — R404 Transient alteration of awareness: Secondary | ICD-10-CM

## 2012-09-26 DIAGNOSIS — G4733 Obstructive sleep apnea (adult) (pediatric): Secondary | ICD-10-CM

## 2012-09-26 NOTE — Telephone Encounter (Signed)
Called and spoke to pt again. She said she is on the way home from the doctor in [place]. She has transportation. She said she still is having the abdominal cramping about the same. She does not want them to drop her off at the hospital because she will not have transportation home. I told her if she gets worse to call 911 and she said she would. I told her Tobi Bastos did not want to send new prescription for phenergan this afternoon, since she still has several pills. I did tell her if she needed to take it to take only 1/2 since she is drowsy so much and she said that she would.

## 2012-09-26 NOTE — Telephone Encounter (Signed)
Patient has history of chronic abdominal pain with a negative CTA for ischemia on file. Had TCS/EGD/ED yesterday. If she truly is having 9/10 pain, would recommend ED evaluation. Doubt an acute issue going on, but unable to rule out over the phone.

## 2012-09-26 NOTE — Telephone Encounter (Signed)
Called pt. She said she is having abdominal pain and it has been constant all day today and she rates it at a 9. She said it is more of cramping feeling. She is very nauseated, but not had any vomiting. She has had 2 normal BM's today. She said she has not taken anything for the nausea. She thinks she has a couple of phenergan tablets but she is not sure. She had oatmeal and cranberry juice for breakfast and has not had anything since then. Please advise!

## 2012-09-26 NOTE — Telephone Encounter (Signed)
Agree. She has a known history of gastroparesis. Her abdominal pain is what concerns me. I agree with your recommendations, and patient should seek medical attention. It is good she is seeing a provider right now. I will be willing to refill nausea medication tomorrow, but she has some currently.

## 2012-09-26 NOTE — Patient Instructions (Addendum)
You have a lot of medicines that may make you so sleepy.   Sample of Nuvigil 150 mg   Try 1 each morning to see if it make you more alert.  Please try very hard to wear your CPAP at night. It won't help you breathe if you don't put it on.   Order- DME [facility] change CPAP to 8 cwp fixed    Dx OSA

## 2012-09-26 NOTE — Telephone Encounter (Signed)
Having stomach cramps and feels very nauseated denies v/d please advise?

## 2012-09-26 NOTE — Telephone Encounter (Signed)
I called pt. She states she is at pulmonary physician's office in [place] at this time, but he has not come in the room yet. I told her she needs to go to the ED if her abdominal pain is at a 9/10. She said she will also tell pulmonary doctor when he comes in. But, she did ask for a refill on her phenergan.

## 2012-09-26 NOTE — Progress Notes (Signed)
06/18/12- 38 yoF referred courtesy of Dr Claris Che [name]-sleep study last year per patient. She reports that she was witnessed stopping breathing in sleep, has a history of loud snoring and is a restless sleeper with frequent waking at night, bathroom trips and nightmares. Easy dyspnea on exertion during the day. She does smoke. New cough and sinus pressure this week attributed to "allergy" with green nasal discharge and no headache. She was sent to hospital April 1 when she says she couldn't wake up while at her doctor's office. She sleeps propped up. Gets sleepy in 30 minutes watching TV during the day. Bedtime 10 PM, sleep latency one or 2 hours, waking during the night 3 or 4 times before up at 8 AM. She had a sleep study at [facility] and was given CPAP but says she can't use it because the machine started smoking and she didn't like the pressure. She was changed from [place] to [place]. She is living alone, divorced and disabled. Son has OSA. History of meningioma 11/23/2011. No seizure in over 3 years. Does not drive. NPSG 3/8/13[name]- mild obstructive sleep apnea, AHI 14 per hour, mostly in REM. Epworth sleepiness score 14/24. Weight 164 pounds.  09/26/12- 72 yoF  Former smoker followed for obstructive sleep apnea follows for:  review download from [facility] with pt today.  pt stated that her back has been hurting her.   Did stop smoking! Some wheeze with exertion. She complains that she is falling asleep before she puts her CPAP/ auto/ [facility] on. Download demonstrated poor compliance and control. She had complained of a long history of difficulty falling asleep. Says a neurologist had questioned narcolepsy. She denies cataplexy or sleep paralysis.  ROS-see HPI Constitutional:   No-   weight loss, night sweats, fevers, chills, +fatigue, lassitude. HEENT:  + headaches, +difficulty swallowing, tooth/dental problems, sore throat,       +  sneezing, itching, ear ache, +nasal  congestion, post nasal drip,  CV:  No-   chest pain, orthopnea, PND, swelling in lower extremities, anasarca, dizziness, palpitations Resp: +  shortness of breath with exertion or at rest.              +  productive cough,  No non-productive cough,  No- coughing up of blood.              No-   change in color of mucus.  No- wheezing.   Skin: No-   rash or lesions. GI:  No-   heartburn, +indigestion, +abdominal pain, nausea, vomiting,  GU:  MS:  No-   joint pain or swelling.   Neuro-     nothing unusual Psych:  No- change in mood or affect. No depression or anxiety.  No memory loss.  OBJ- Physical Exam - +extremely drowsy during conversation General-  Oriented, Affect-flat, Distress- none acute Skin- rash-none, lesions- none, excoriation- none Lymphadenopathy- none Head- atraumatic            Eyes- Gross vision intact, PERRLA, conjunctivae and secretions clear            Ears- Hearing, canals-normal            Nose- Clear, no-Septal dev, mucus, polyps, erosion, perforation             Throat- Mallampati III , mucosa clear , drainage- none, tonsils- atrophic, + dentures Neck- flexible , trachea midline, no stridor , thyroid nl, carotid no bruit Chest - symmetrical excursion , unlabored  Heart/CV- RRR , no murmur , no gallop  , no rub, nl s1 s2                           - JVD- none , edema- none, stasis changes- none, varices- none           Lung- clear to P&A, wheeze- none, cough+rattling , dullness-none, rub- none           Chest wall-  Abd- soft Br/ Gen/ Rectal- Not done, not indicated Extrem- cyanosis- none, clubbing, none, atrophy- none, strength- nl. cane Neuro- lethargic but responsive.

## 2012-09-26 NOTE — Telephone Encounter (Signed)
I spoke with Florene Glen , NP. She declines to give more phenergan at this time. ( I could hardly understand pt the first time I spoke with her. She seemed a little more coherent when I spoke to her and she said she was at the doctor's office). Per Tobi Bastos, she can try 1/2 dose of the phenergan if she has to have something for nausea. I will call a little later since she is at the physician's office.)

## 2012-09-27 NOTE — Telephone Encounter (Signed)
Agree 

## 2012-10-01 ENCOUNTER — Other Ambulatory Visit: Payer: Self-pay | Admitting: Gastroenterology

## 2012-10-03 ENCOUNTER — Encounter: Payer: Self-pay | Admitting: Internal Medicine

## 2012-10-03 ENCOUNTER — Ambulatory Visit (INDEPENDENT_AMBULATORY_CARE_PROVIDER_SITE_OTHER): Payer: Medicare Other | Admitting: Otolaryngology

## 2012-10-03 DIAGNOSIS — J310 Chronic rhinitis: Secondary | ICD-10-CM

## 2012-10-03 DIAGNOSIS — R040 Epistaxis: Secondary | ICD-10-CM

## 2012-10-04 ENCOUNTER — Telehealth: Payer: Self-pay | Admitting: Family Medicine

## 2012-10-04 NOTE — Telephone Encounter (Signed)
Pt called in to ask if she has a diagnosis of heart failure , states she saw this on a paper recently received from GI I advised that I do not see this as  a diagnosis, and that she should call her cardiologist she just saw , to verify that this is the case Also c/o chills,dizziness , and shakes for the week, since Monday, I advised Ed eval for this. States she does not want to go to the Ed afraid they will think she is having a breakdown, she agreed to go however, when I assured her that her speech is clear and there is no reason from what I am hearing too assume that her mental health i anissue

## 2012-10-09 ENCOUNTER — Other Ambulatory Visit (HOSPITAL_COMMUNITY): Payer: Self-pay | Admitting: "Endocrinology

## 2012-10-09 ENCOUNTER — Telehealth: Payer: Self-pay | Admitting: Family Medicine

## 2012-10-09 DIAGNOSIS — E049 Nontoxic goiter, unspecified: Secondary | ICD-10-CM

## 2012-10-10 ENCOUNTER — Other Ambulatory Visit: Payer: Self-pay

## 2012-10-10 DIAGNOSIS — D509 Iron deficiency anemia, unspecified: Secondary | ICD-10-CM

## 2012-10-11 ENCOUNTER — Ambulatory Visit (HOSPITAL_COMMUNITY)
Admission: RE | Admit: 2012-10-11 | Discharge: 2012-10-11 | Disposition: A | Discharge status: Home / Self Care | Payer: Medicare Other | Source: Ambulatory Visit | Attending: "Endocrinology | Admitting: "Endocrinology

## 2012-10-11 DIAGNOSIS — E049 Nontoxic goiter, unspecified: Secondary | ICD-10-CM

## 2012-10-11 DIAGNOSIS — E042 Nontoxic multinodular goiter: Secondary | ICD-10-CM | POA: Insufficient documentation

## 2012-10-11 LAB — BASIC METABOLIC PANEL
BUN: 19 mg/dL (ref 6–23)
CO2: 27 mEq/L (ref 19–32)
Chloride: 105 mEq/L (ref 96–112)
Glucose, Bld: 314 mg/dL — ABNORMAL HIGH (ref 70–99)
Potassium: 3.9 mEq/L (ref 3.5–5.3)

## 2012-10-12 ENCOUNTER — Telehealth: Payer: Self-pay | Admitting: Family Medicine

## 2012-10-12 NOTE — Telephone Encounter (Signed)
Pls contact her , her HBa1C is past due, and she recently hd lab, uncsure if it was fasting but her blood sugar was very high over 300,last HBa1C was in March  vERY IMPORTANT,

## 2012-10-13 ENCOUNTER — Encounter: Payer: Self-pay | Admitting: Family Medicine

## 2012-10-13 NOTE — Assessment & Plan Note (Signed)
As of 09/26/2012 she says she has stopped smoking

## 2012-10-13 NOTE — Assessment & Plan Note (Addendum)
She had described problem initiating sleep but now looks extremely sleepy and complains that she falls asleep before she puts CPAP on. CPAP compliance and control are in- adequate as I discussed with her. Plan-ask [facility] to change to a fixed pressure 8, hoping for better compliance.

## 2012-10-13 NOTE — Assessment & Plan Note (Signed)
Continues to smoke but wants desperately to quit , nicotrol preescribed

## 2012-10-13 NOTE — Assessment & Plan Note (Signed)
Adequate control, no med change 

## 2012-10-13 NOTE — Assessment & Plan Note (Addendum)
Excessive daytime somnolence. I suspect this is a medication effect compounded by her underlying mental health issues. Plan-as a short term probe, she is given samples of Nuvigil 150 mg for trial

## 2012-10-13 NOTE — Assessment & Plan Note (Signed)
Acute episode, refer to ENt

## 2012-10-13 NOTE — Assessment & Plan Note (Signed)
persitent despite intra articular injection, refer to ortho for re eval

## 2012-10-13 NOTE — Assessment & Plan Note (Signed)
Unchanged, pain managed through pain clinic

## 2012-10-13 NOTE — Assessment & Plan Note (Signed)
Updated lab needed, pt to get this with next lab draw Patient educated about the importance of limiting  Carbohydrate intake , the need to commit to daily physical activity for a minimum of 30 minutes , and to commit weight loss. The fact that changes in all these areas will reduce or eliminate all together the development of diabetes is stressed.

## 2012-10-14 ENCOUNTER — Other Ambulatory Visit: Payer: Self-pay | Admitting: Family Medicine

## 2012-10-14 ENCOUNTER — Other Ambulatory Visit (HOSPITAL_COMMUNITY): Payer: Self-pay | Admitting: "Endocrinology

## 2012-10-14 DIAGNOSIS — E041 Nontoxic single thyroid nodule: Secondary | ICD-10-CM

## 2012-10-14 MED ORDER — BENZONATATE 100 MG PO CAPS: 100.0000 mg | ORAL_CAPSULE | Freq: Three times a day (TID) | ORAL | Status: DC | PRN

## 2012-10-14 NOTE — Telephone Encounter (Signed)
Sent to pharmacy 

## 2012-10-14 NOTE — Telephone Encounter (Signed)
Patient has had a dry cough since Friday and wants some tessalon perles. Not on medlist. Ok to send in?

## 2012-10-16 NOTE — Telephone Encounter (Signed)
error 

## 2012-10-18 ENCOUNTER — Telehealth: Payer: Self-pay

## 2012-10-18 ENCOUNTER — Ambulatory Visit: Payer: Medicare Other | Admitting: Family Medicine

## 2012-10-18 ENCOUNTER — Other Ambulatory Visit: Payer: Self-pay | Admitting: Family Medicine

## 2012-10-18 MED ORDER — SAXAGLIPTIN HCL 2.5 MG PO TABS: 2.5000 mg | ORAL_TABLET | Freq: Every day | ORAL | Status: DC

## 2012-10-18 NOTE — Telephone Encounter (Signed)
I have re started [place] 2.5 mg tab pls let her know and send in printed script, will also need to re order supplies for once daily testing. Remind her to go to diabetic class so that she eats appropriately, please

## 2012-10-18 NOTE — Telephone Encounter (Signed)
Patient concern addressed.

## 2012-10-18 NOTE — Telephone Encounter (Signed)
Blood sugar 221 fasting this am. Has been running high and has been checking it daily fasting.  Should she go back on any diabetes meds?

## 2012-10-18 NOTE — Telephone Encounter (Signed)
PATIENT AWARE

## 2012-10-20 IMAGING — CR DG ABDOMEN 1V
1 series · 1 of 1 positions shown · non-contrast
Comparison: Lumbar spine radiographs - 09/28/2010; abdominal CT -
03/30/2010

CLINICAL DATA: Constipation and abdominal pain

ABDOMEN - 1 VIEW

[view not recorded]
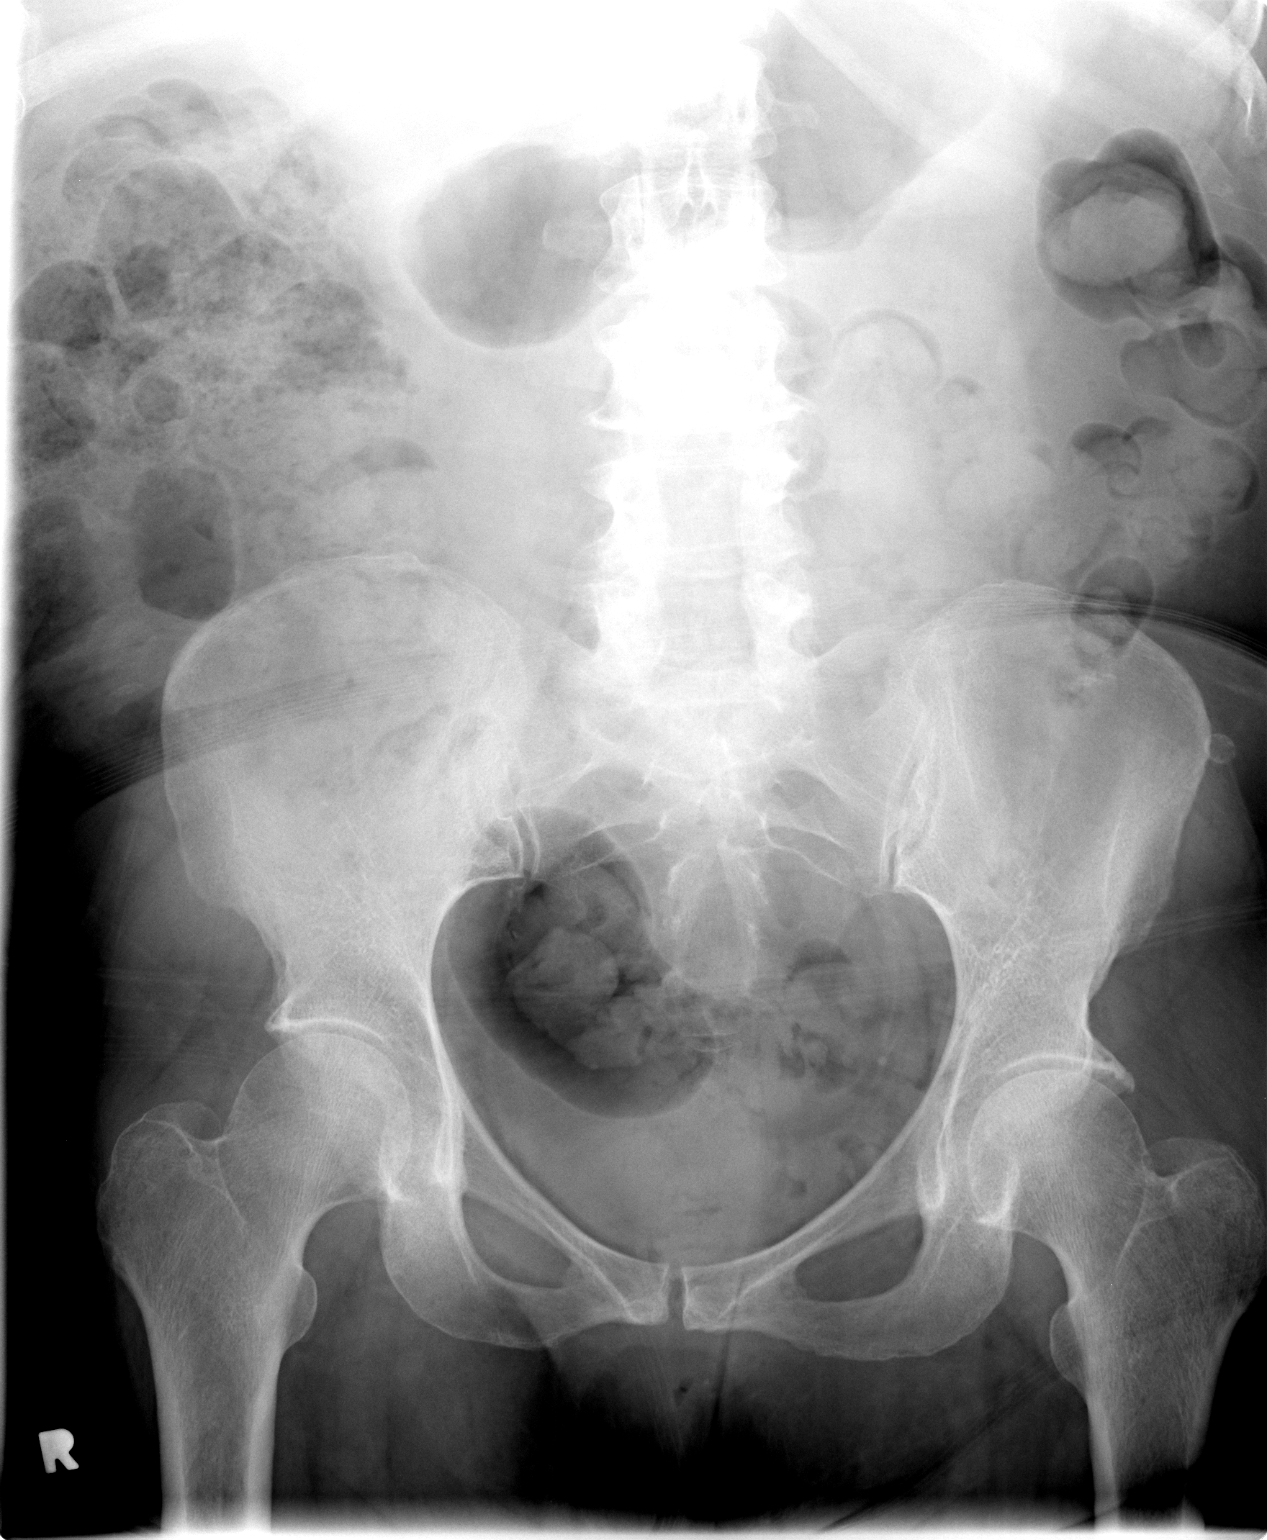

[1 of 1 positions shown; findings below may reference images not displayed]

FINDINGS: Moderate to large colonic stool burden.  No definite evidence of
obstruction.  Evaluation for pneumoperitoneum is limited secondary
exclusion of the lower thorax.  No definite pneumatosis.  Post
cholecystectomy.  Post L4 and L5 laminectomy.
IMPRESSION: Moderate to large colonic stool burden without evidence of
obstruction.

## 2012-10-24 ENCOUNTER — Ambulatory Visit (HOSPITAL_COMMUNITY): Admission: RE | Admit: 2012-10-24 | Payer: Medicare Other | Source: Ambulatory Visit

## 2012-10-24 ENCOUNTER — Ambulatory Visit (HOSPITAL_COMMUNITY): Payer: Medicare Other

## 2012-10-26 LAB — CBC WITH DIFFERENTIAL/PLATELET
Basophils Absolute: 0 10*3/uL (ref 0.0–0.1)
Basophils Relative: 0 % (ref 0–1)
Eosinophils Relative: 3 % (ref 0–5)
HCT: 33 % — ABNORMAL LOW (ref 36.0–46.0)
MCH: 21.5 pg — ABNORMAL LOW (ref 26.0–34.0)
MCHC: 29.7 g/dL — ABNORMAL LOW (ref 30.0–36.0)
MCV: 72.4 fL — ABNORMAL LOW (ref 78.0–100.0)
Monocytes Absolute: 0.3 10*3/uL (ref 0.1–1.0)
RDW: 15.1 % (ref 11.5–15.5)

## 2012-10-29 ENCOUNTER — Ambulatory Visit (HOSPITAL_COMMUNITY)
Admission: RE | Admit: 2012-10-29 | Discharge: 2012-10-29 | Disposition: A | Discharge status: Home / Self Care | Payer: Medicare Other | Source: Ambulatory Visit | Attending: "Endocrinology | Admitting: "Endocrinology

## 2012-10-29 ENCOUNTER — Encounter (HOSPITAL_COMMUNITY): Payer: Self-pay

## 2012-10-29 ENCOUNTER — Ambulatory Visit (HOSPITAL_COMMUNITY)
Admission: RE | Admit: 2012-10-29 | Discharge: 2012-10-29 | Disposition: A | Discharge status: Home / Self Care | Payer: Medicare Other | Source: Ambulatory Visit | Attending: Family Medicine | Admitting: Family Medicine

## 2012-10-29 DIAGNOSIS — E041 Nontoxic single thyroid nodule: Secondary | ICD-10-CM

## 2012-10-29 DIAGNOSIS — R918 Other nonspecific abnormal finding of lung field: Secondary | ICD-10-CM | POA: Insufficient documentation

## 2012-10-29 DIAGNOSIS — E079 Disorder of thyroid, unspecified: Secondary | ICD-10-CM | POA: Insufficient documentation

## 2012-10-29 DIAGNOSIS — R911 Solitary pulmonary nodule: Secondary | ICD-10-CM

## 2012-10-29 DIAGNOSIS — I2510 Atherosclerotic heart disease of native coronary artery without angina pectoris: Secondary | ICD-10-CM | POA: Insufficient documentation

## 2012-10-29 MED ORDER — LIDOCAINE HCL (PF) 2 % IJ SOLN
10.0000 mL | Freq: Once | INTRAMUSCULAR | Status: DC
  Filled 2012-10-29: qty 10

## 2012-10-29 MED ORDER — LIDOCAINE HCL (PF) 2 % IJ SOLN
INTRAMUSCULAR | Status: AC
  Administered 2012-10-29: 10 mL
  Filled 2012-10-29: qty 10

## 2012-10-31 ENCOUNTER — Ambulatory Visit (INDEPENDENT_AMBULATORY_CARE_PROVIDER_SITE_OTHER): Payer: Medicare Other | Admitting: Orthopedic Surgery

## 2012-10-31 ENCOUNTER — Encounter: Payer: Self-pay | Admitting: Orthopedic Surgery

## 2012-10-31 VITALS — BP 105/54 | Ht 62.0 in | Wt 137.0 lb

## 2012-10-31 DIAGNOSIS — M5010 Cervical disc disorder with radiculopathy, unspecified cervical region: Secondary | ICD-10-CM

## 2012-10-31 DIAGNOSIS — M5412 Radiculopathy, cervical region: Secondary | ICD-10-CM

## 2012-10-31 HISTORY — DX: Cervical disc disorder with radiculopathy, unspecified cervical region: M50.10

## 2012-10-31 MED ORDER — GABAPENTIN 100 MG PO CAPS: 100.0000 mg | ORAL_CAPSULE | Freq: Three times a day (TID) | ORAL | Status: DC

## 2012-10-31 NOTE — Progress Notes (Signed)
Patient ID: [name], female   DOB: 07-21-1950, 62 y.o.   MRN: [id number] Chief Complaint  Patient presents with  . Follow-up    left shoulder and arm pain s/p injection 08/21/12    Recheck left shoulder and arm pain continued pain status post injection without improvement. Pain radiates from the neck down to the left shoulder  Previous CT scan shows scoliosis and C5-C6 C6-C7 degenerative disc disease  Still has weakness to his night pain still has loss of motion in the left shoulder as well  General appearance is normal, the patient is alert and oriented x3 with normal mood and affect. BP 105/54  Ht 5\' 2"  (1.575 m)  Wt 137 lb (62.143 kg)  BMI 25.05 kg/m2 Weakness and decreased range of motion left shoulder as well  Reflexes show mild decrease reflexes at the elbow  Decreased sensation in the thumb and index finger  Degenerative disc disease cervical spondylosis  MRI cervical spine potential candidate for ESI  100 mg gabapentin 3 times a day

## 2012-10-31 NOTE — Patient Instructions (Signed)
Start gabapentin 100 mg 3 times a day   MRI c spine   You have been scheduled for an MRI scan.  Your insurance company requires a precertification prior to scheduling the MRI.  If the MRI scan is not approved we will let you know and make further treatment recommendations according to your insurance's guidelines.   We will schedule you for another  appointment to review the results and make further treatment recommendations

## 2012-11-04 ENCOUNTER — Telehealth: Payer: Self-pay | Admitting: *Deleted

## 2012-11-04 NOTE — Telephone Encounter (Signed)
No pre-cert required for Medicare per Medicare guidelines and no pre-cert required for [place] Medicaid per Shay G. Patient is scheduled for 11/07/12 at 11:45 am and the patient is aware of appointment. States she may change it so the phone number to radiology was given.

## 2012-11-05 ENCOUNTER — Encounter (HOSPITAL_COMMUNITY): Payer: Self-pay

## 2012-11-05 ENCOUNTER — Other Ambulatory Visit (HOSPITAL_COMMUNITY): Payer: Self-pay | Admitting: "Endocrinology

## 2012-11-05 ENCOUNTER — Ambulatory Visit (HOSPITAL_COMMUNITY)
Admission: RE | Admit: 2012-11-05 | Discharge: 2012-11-05 | Disposition: A | Discharge status: Home / Self Care | Payer: Medicare Other | Source: Ambulatory Visit | Attending: "Endocrinology | Admitting: "Endocrinology

## 2012-11-05 DIAGNOSIS — E041 Nontoxic single thyroid nodule: Secondary | ICD-10-CM

## 2012-11-05 DIAGNOSIS — E0510 Thyrotoxicosis with toxic single thyroid nodule without thyrotoxic crisis or storm: Secondary | ICD-10-CM | POA: Insufficient documentation

## 2012-11-05 MED ORDER — LIDOCAINE HCL (PF) 2 % IJ SOLN
10.0000 mL | Freq: Once | INTRAMUSCULAR | Status: AC
  Filled 2012-11-05: qty 10

## 2012-11-05 MED ORDER — LIDOCAINE HCL (PF) 2 % IJ SOLN
INTRAMUSCULAR | Status: AC
  Filled 2012-11-05: qty 10

## 2012-11-05 NOTE — Progress Notes (Signed)
Pt for thyroid biopsy with Dr.Boles. Pt given 3 ml lidocaine 2% by MD. Pt tolerated procedure isthmus and left lobe biopsied

## 2012-11-07 ENCOUNTER — Other Ambulatory Visit (HOSPITAL_COMMUNITY): Payer: Medicare Other

## 2012-11-12 ENCOUNTER — Ambulatory Visit (INDEPENDENT_AMBULATORY_CARE_PROVIDER_SITE_OTHER): Payer: Medicare Other | Admitting: Nurse Practitioner

## 2012-11-12 ENCOUNTER — Encounter: Payer: Self-pay | Admitting: Nurse Practitioner

## 2012-11-12 VITALS — BP 156/71 | HR 65 | Ht 62.0 in | Wt 145.0 lb

## 2012-11-12 DIAGNOSIS — R51 Headache: Secondary | ICD-10-CM

## 2012-11-12 NOTE — Progress Notes (Signed)
Reason for visit followup for headaches HPI:[name], 62  year-old right-handed black female with a history of a meningioma resection in September 2013 returns for followup. Since that time, the patient has had an increase in her headaches. The patient mainly notes bifrontal headaches, but she also notes some neck discomfort. The patient may have some sharp pains in the back of the head. The patient may have some nausea, but no vomiting with the headache. The patient is on a multitude of psychoactive medications. The patient has numerous medical conditions that are chronic. She recently had a thyroid biopsy due to a nodule. She is also anemic around 9.2 for hemoglobin. She is already followed by Dr.Doonquah in [place],  [place]. The patient does report some gait instability, and she uses a cane for ambulation. She denies any recent falls. The patient indicates that she has a nurse and an aide that helps her keep up with her medications, but she is alone at today's visit and 30 minutes late for the appointment.  ROS:  14 system review of symptoms is negative except for the following Fatigue, swelling in the legs, blurred vision, shortness of breath, anemia, joint pain, allergies, headache, depression anxiety   Medications Current Outpatient Prescriptions on File Prior to Visit  Medication Sig Dispense Refill  . benzonatate (TESSALON) 100 MG capsule Take 1 capsule (100 mg total) by mouth 3 (three) times daily as needed for cough.  20 capsule  0  . clobetasol (TEMOVATE) 0.05 % external solution       . cloNIDine (CATAPRES) 0.3 MG tablet Take 1 tablet (0.3 mg total) by mouth 2 (two) times daily.  60 tablet  11  . clopidogrel (PLAVIX) 75 MG tablet TAKE ONE TABLET BY MOUTH ONCE DAILY.  30 tablet  3  . cyclobenzaprine (FLEXERIL) 10 MG tablet Take 10 mg by mouth 3 (three) times daily as needed for muscle spasms.      . cycloSPORINE (RESTASIS) 0.05 % ophthalmic emulsion 1 drop 2 (two) times daily.       . diphenoxylate-atropine (LOMOTIL) 2.5-0.025 MG per tablet Take 1 tablet by mouth 4 (four) times daily as needed for diarrhea or loose stools. For loose stools  30 tablet  1  . fluticasone (FLONASE) 50 MCG/ACT nasal spray Place 1 spray into the nose daily. Allergies  16 g  3  . gabapentin (NEURONTIN) 100 MG capsule Take 1 capsule (100 mg total) by mouth 3 (three) times daily.  90 capsule  2  . hydrochlorothiazide (HYDRODIURIL) 12.5 MG tablet Take 12.5 mg by mouth daily.      [name] HYDROcodone-acetaminophen (NORCO/VICODIN) 5-325 MG per tablet Take 2 tablets by mouth every 4 (four) hours as needed for pain.  10 tablet  0  . lamoTRIgine (LAMICTAL) 100 MG tablet Take 100 mg by mouth daily.       [name] levothyroxine (LEVOXYL) 50 MCG tablet Take 50 mcg by mouth every morning.       . Linaclotide (LINZESS) 290 MCG CAPS Take 1 capsule by mouth daily.  30 capsule  3  . LORazepam (ATIVAN) 1 MG tablet Take 1 mg by mouth 4 (four) times daily.       . meclizine (ANTIVERT) 12.5 MG tablet Take 12.5 mg by mouth 3 (three) times daily as needed.      . methocarbamol (ROBAXIN) 500 MG tablet 500 mg daily.       . metoprolol (LOPRESSOR) 50 MG tablet Take 50 mg by mouth 2 (two) times daily.      [name]  mirtazapine (REMERON) 30 MG tablet Take 30 mg by mouth at bedtime.       . montelukast (SINGULAIR) 10 MG tablet Take 10 mg by mouth daily.      [name] morphine (MSIR) 15 MG tablet Take 15 mg by mouth 2 (two) times daily as needed. For pain      . nystatin (MYCOSTATIN) powder Apply topically 2 (two) times daily.      . ondansetron (ZOFRAN) 4 MG tablet TAKE 1 TABLET BY MOUTH EVERY 8 HOURS AS NEEDED FOR NAUSEA.  30 tablet  3  . polyethylene glycol-electrolytes (TRILYTE) 420 G solution Take 4,000 mLs by mouth as directed.  4000 mL  0  . polyethylene glycol-electrolytes (TRILYTE) 420 G solution Take 4,000 mLs by mouth as directed.  4000 mL  0  . pregabalin (LYRICA) 75 MG capsule Take 75 mg by mouth daily.      . RABEprazole (ACIPHEX) 20  MG tablet Take 1 tablet (20 mg total) by mouth 2 (two) times daily.  60 tablet  11  . rosuvastatin (CRESTOR) 5 MG tablet Take 1 tablet (5 mg total) by mouth at bedtime.  30 tablet  11  . saxagliptin HCl (ONGLYZA) 2.5 MG TABS tablet Take 1 tablet (2.5 mg total) by mouth daily.  30 tablet  5  . sertraline (ZOLOFT) 100 MG tablet Take 150 mg by mouth at bedtime.       [name] thioridazine (MELLARIL) 10 MG tablet       . thiothixene (NAVANE) 2 MG capsule Take 2 mg by mouth at bedtime.      . traZODone (DESYREL) 50 MG tablet as needed.       . Vitamin D, Ergocalciferol, (DRISDOL) 50000 UNITS CAPS TAKE 1 CAPSULE BY MOUTH ONCE WEEKLY.  4 capsule  2  . VOLTAREN 1 % GEL APPLY TWICE DAILY TO AFFECTED AREA(S) AS NEEDED FOR PAIN.  100 g  2   No current facility-administered medications on file prior to visit.    Allergies  Allergies  Allergen Reactions  . Cephalexin Hives  . Milk-Related Compounds Other (See Comments)    Doesn't agree with stomach.   . Penicillins Hives  . Phenazopyridine Hcl Other (See Comments)    [place]      Physical Exam General: well developed, well nourished, seated, in no evident distress Head: head normocephalic and atraumatic. Oropharynx benign Neck: supple with no carotid  bruits Cardiovascular: regular rate and rhythm, no murmurs  Neurologic Exam Mental Status: Awake and fully alert. Oriented to place and time. Follows all commands. Speech and language normal.   Cranial Nerves:  Pupils equal, briskly reactive to light. Extraocular movements full without nystagmus. Visual fields full to confrontation. Hearing intact and symmetric to finger snap. Facial sensation intact. Face, tongue, palate move normally and symmetrically. Neck flexion and extension normal.  Motor: Normal bulk and tone. Normal strength in all tested extremity muscles . She has decreased range of motion to left shoulder.  Sensory.: intact to touch and pinprick and vibratory.  Coordination: Rapid alternating  movements normal in all extremities. Finger-to-nose and heel-to-shin performed accurately bilaterally. No dysmetria Gait and Station: Arises from chair without difficulty. Stance is wide based.  Able to heel, toe and unsteady with tandem walk.Romberg negative Reflexes: 1+ and symmetric. Toes downgoing.     ASSESSMENT: Headaches, gait disorder, polypharmacy, recent thyroid nodule, history of sleep disorder Patient already sees a neurologist in [place], Dr. Gerilyn Pilgrim.      PLAN: No additional meds will be  added, she is currently on greater than 30 meds Diagnosis of OSA followed by Dr. Gerilyn Pilgrim  Recent dx  thyroid nodule with biopsy She will follow up with Dr.Doonqueh, regarding her headaches and OSA , no further visits here.   [name], GNP-BC APRN

## 2012-11-12 NOTE — Patient Instructions (Addendum)
No additional meds will be added Recent diagnosis of OSA followed by Dr. Gerilyn Pilgrim  Recent dx  thyroid nodule Both of these could cause headache.  She will follow up with Dr.Doonqueh, regarding her headaches and OSA-  no further visits here.

## 2012-11-12 NOTE — Progress Notes (Signed)
I have read the note, and I agree with the clinical assessment and plan.  WILLIS,CHARLES KEITH   

## 2012-11-13 ENCOUNTER — Ambulatory Visit: Payer: Medicare Other | Admitting: Gastroenterology

## 2012-11-14 ENCOUNTER — Telehealth: Payer: Self-pay

## 2012-11-14 NOTE — Telephone Encounter (Signed)
Patient also states that her blood pressure has been elevated.   She has an appointment on 9/18.  Notified her if her systolic number is in the 190s or 200s to seek medical attention.

## 2012-11-15 ENCOUNTER — Emergency Department (HOSPITAL_COMMUNITY): Payer: Medicare Other

## 2012-11-15 ENCOUNTER — Emergency Department (HOSPITAL_COMMUNITY)
Admission: EM | Admit: 2012-11-15 | Discharge: 2012-11-15 | Disposition: A | Discharge status: Home / Self Care | Payer: Medicare Other | Attending: Emergency Medicine | Admitting: Emergency Medicine

## 2012-11-15 ENCOUNTER — Encounter (HOSPITAL_COMMUNITY): Payer: Self-pay | Admitting: Emergency Medicine

## 2012-11-15 DIAGNOSIS — F319 Bipolar disorder, unspecified: Secondary | ICD-10-CM | POA: Insufficient documentation

## 2012-11-15 DIAGNOSIS — S0990XA Unspecified injury of head, initial encounter: Secondary | ICD-10-CM

## 2012-11-15 DIAGNOSIS — G8929 Other chronic pain: Secondary | ICD-10-CM | POA: Insufficient documentation

## 2012-11-15 DIAGNOSIS — Z791 Long term (current) use of non-steroidal anti-inflammatories (NSAID): Secondary | ICD-10-CM | POA: Insufficient documentation

## 2012-11-15 DIAGNOSIS — E039 Hypothyroidism, unspecified: Secondary | ICD-10-CM | POA: Insufficient documentation

## 2012-11-15 DIAGNOSIS — Z87891 Personal history of nicotine dependence: Secondary | ICD-10-CM | POA: Insufficient documentation

## 2012-11-15 DIAGNOSIS — I10 Essential (primary) hypertension: Secondary | ICD-10-CM | POA: Insufficient documentation

## 2012-11-15 DIAGNOSIS — M129 Arthropathy, unspecified: Secondary | ICD-10-CM | POA: Insufficient documentation

## 2012-11-15 DIAGNOSIS — W1809XA Striking against other object with subsequent fall, initial encounter: Secondary | ICD-10-CM | POA: Insufficient documentation

## 2012-11-15 DIAGNOSIS — Z9889 Other specified postprocedural states: Secondary | ICD-10-CM | POA: Insufficient documentation

## 2012-11-15 DIAGNOSIS — E119 Type 2 diabetes mellitus without complications: Secondary | ICD-10-CM | POA: Insufficient documentation

## 2012-11-15 DIAGNOSIS — Z79899 Other long term (current) drug therapy: Secondary | ICD-10-CM | POA: Insufficient documentation

## 2012-11-15 DIAGNOSIS — Z880 Allergy status to penicillin: Secondary | ICD-10-CM | POA: Insufficient documentation

## 2012-11-15 DIAGNOSIS — Y9389 Activity, other specified: Secondary | ICD-10-CM | POA: Insufficient documentation

## 2012-11-15 DIAGNOSIS — IMO0002 Reserved for concepts with insufficient information to code with codable children: Secondary | ICD-10-CM | POA: Insufficient documentation

## 2012-11-15 DIAGNOSIS — Z8673 Personal history of transient ischemic attack (TIA), and cerebral infarction without residual deficits: Secondary | ICD-10-CM | POA: Insufficient documentation

## 2012-11-15 DIAGNOSIS — G43909 Migraine, unspecified, not intractable, without status migrainosus: Secondary | ICD-10-CM | POA: Insufficient documentation

## 2012-11-15 DIAGNOSIS — S0003XA Contusion of scalp, initial encounter: Secondary | ICD-10-CM | POA: Insufficient documentation

## 2012-11-15 DIAGNOSIS — S0083XA Contusion of other part of head, initial encounter: Secondary | ICD-10-CM

## 2012-11-15 DIAGNOSIS — Z7902 Long term (current) use of antithrombotics/antiplatelets: Secondary | ICD-10-CM | POA: Insufficient documentation

## 2012-11-15 DIAGNOSIS — Z862 Personal history of diseases of the blood and blood-forming organs and certain disorders involving the immune mechanism: Secondary | ICD-10-CM | POA: Insufficient documentation

## 2012-11-15 DIAGNOSIS — F411 Generalized anxiety disorder: Secondary | ICD-10-CM | POA: Insufficient documentation

## 2012-11-15 DIAGNOSIS — Y9289 Other specified places as the place of occurrence of the external cause: Secondary | ICD-10-CM | POA: Insufficient documentation

## 2012-11-15 DIAGNOSIS — K219 Gastro-esophageal reflux disease without esophagitis: Secondary | ICD-10-CM | POA: Insufficient documentation

## 2012-11-15 DIAGNOSIS — G40909 Epilepsy, unspecified, not intractable, without status epilepticus: Secondary | ICD-10-CM | POA: Insufficient documentation

## 2012-11-15 MED ORDER — NAPROXEN 500 MG PO TABS: 500.0000 mg | ORAL_TABLET | Freq: Two times a day (BID) | ORAL | Status: DC

## 2012-11-15 MED ORDER — OXYCODONE-ACETAMINOPHEN 5-325 MG PO TABS
2.0000 | ORAL_TABLET | Freq: Once | ORAL | Status: AC
  Administered 2012-11-15: 2 via ORAL
  Filled 2012-11-15: qty 2

## 2012-11-15 NOTE — ED Notes (Signed)
Pt placed in gown, fall risk bracelet and slippers in place. Rails up and call bell within reach. Dr. Hyacinth Meeker at bedside.

## 2012-11-15 NOTE — ED Notes (Addendum)
Pt fell while getting up from commode, striking her head on the tub. Pt fell again trying to get up and struck her head on the floor. Bruising and edema noted to R eye and face. Pt A&O. Pt reports she takes Plavix.

## 2012-11-15 NOTE — ED Provider Notes (Signed)
CSN: [id number]     Arrival date & time 11/15/12  1058 History  This chart was scribed for Vida Roller, MD by Quintella Reichert, ED scribe.  This patient was seen in room APA08/APA08 and the patient's care was started at 11:34 AM.    Chief Complaint  Patient presents with  . Fall    The history is provided by the patient. No language interpreter was used.    HPI Comments: [name] is a 62 y.o. female with h/o HTN, stroke, and osteoporosis who presents to the Emergency Department complaining of a fall that occurred pta.  Pt was getting up from the commode when her foot slipped and she hit her head on the edge of the bath tub and then the floor.  She was able to pull herself back up after the fall.  She also hit her right shoulder in the fall and complains of mild soreness to that shoulder.  She denies leg pain.   Pt walks with a cane at baseline for left-sided weakness and pain subsequent to a stroke.  She is on Plavix.  She lives alone.   Past Medical History  Diagnosis Date  . Bipolar disorder   . CVA (cerebral infarction)   . Pancreatitis 2006    due to Depakote with normal EUS   . Osteoporosis   . Chronic back pain   . Diabetes mellitus   . Trigger finger   . Anxiety disorder   . Hypertension   . Migraines   . Diverticulosis     TCS 9/08 by Dr. Lina Sar for diarrhea . Bx for micro scopic colitis negative.   . Schatzki's ring     non critical / EGD with ED 8/2011with RMR  . S/P colonoscopy 7829,5621, 2011    left-sided diverticula, hx of simple adenomas . 2011, random bx negative for microscopic colitis  . Glaucoma   . Allergic rhinitis   . Hypothyroidism     thyroid goiter  . Anemia   . Blood transfusion   . GERD (gastroesophageal reflux disease)   . Stroke     left sided weakness  . Seizures     unknown etiology-on meds-last seizure was 3 yerars ago  . Anxiety   . Depression   . Metabolic encephalopathy 08/03/2011  . Sleep apnea   . Arthritis   . Gum  symptoms     infection on antibiotic    Past Surgical History  Procedure Laterality Date  . Abdominal hysterectomy    . Cholecystectomy    . Ovarian cyst removal    . Carpal tunnel release  07/22/04    left/ Dr. Romeo Apple   . Breast reduction surgery    . Bilateral cataract surgery    . Biopsy of thyroid gland  2009  . Surgical excision of 3 tumors from right thigh and right buttock  and left upper thigh  2010  . Back surgery  July 2012  . Spine surgery  09/29/2010    dr brooks  . Maloney dilation  12/29/2010    RMR;  . Esophagogastroduodenoscopy  12/29/2010    Rourk-Retained food in the esophagus and stomach, small hiatal hernia, status post Maloney dilation of the esophagus  . Craniotomy  11/23/2011    Procedure: CRANIOTOMY TUMOR EXCISION;  Surgeon: Hewitt Shorts, MD;  Location: [facility];  Service: Neurosurgery;  Laterality: N/A;  Craniotomy for tumor resection  . Brain surgery  11/2011    resection of meningioma  .  Colonoscopy N/A 09/25/2012    Procedure: COLONOSCOPY;  Surgeon: Corbin Ade, MD;  Location: [facility];  Service: Endoscopy;  Laterality: N/A;  10:15  . Esophagogastroduodenoscopy N/A 09/25/2012    Procedure: ESOPHAGOGASTRODUODENOSCOPY (EGD);  Surgeon: Corbin Ade, MD;  Location: [facility];  Service: Endoscopy;  Laterality: N/A;    Family History  Problem Relation Age of Onset  . Heart attack Mother   . Pneumonia Father   . Kidney failure Father   . Cancer Sister     pancreatic  . Diabetes Brother   . Hypertension Brother   . Colon cancer Neg Hx   . Anesthesia problems Neg Hx   . Hypotension Neg Hx   . Malignant hyperthermia Neg Hx   . Pseudochol deficiency Neg Hx     History  Substance Use Topics  . Smoking status: Former Smoker -- 0.25 packs/day for 7 years    Types: Cigarettes    Quit date: 09/22/2012  . Smokeless tobacco: Never Used     Comment: started at 24 smoked 6 yrs and stopped. restarted 2 months ago  . Alcohol Use: No      Comment: 5 cigs/day     OB History   Grav Para Term Preterm Abortions TAB SAB Ect Mult Living   6 1 1  5  5          Review of Systems A complete 10 system review of systems was obtained and all systems are negative except as noted in the HPI and PMH.    Allergies  Cephalexin; Iron; Milk-related compounds; Penicillins; and Phenazopyridine hcl  Home Medications   Current Outpatient Rx  Name  Route  Sig  Dispense  Refill  . diclofenac sodium (VOLTAREN) 1 % GEL   Topical   Apply 2 g topically daily as needed (Pain).         [name] HYDROcodone-acetaminophen (NORCO/VICODIN) 5-325 MG per tablet   Oral   Take 1 tablet by mouth every 6 (six) hours as needed for pain.         [name] ondansetron (ZOFRAN) 4 MG tablet   Oral   Take 4 mg by mouth every 8 (eight) hours as needed for nausea.         . clobetasol (TEMOVATE) 0.05 % external solution               . cloNIDine (CATAPRES) 0.3 MG tablet   Oral   Take 1 tablet (0.3 mg total) by mouth 2 (two) times daily.   60 tablet   11     Decreased dose clonidine effective 06/14/2012 Clo ...   . clopidogrel (PLAVIX) 75 MG tablet      TAKE ONE TABLET BY MOUTH ONCE DAILY.   30 tablet   3     Dispense as written.   . cyclobenzaprine (FLEXERIL) 10 MG tablet   Oral   Take 10 mg by mouth 3 (three) times daily as needed for muscle spasms.         . diphenoxylate-atropine (LOMOTIL) 2.5-0.025 MG per tablet   Oral   Take 1 tablet by mouth 4 (four) times daily as needed for diarrhea or loose stools. For loose stools   30 tablet   1   . fluticasone (FLONASE) 50 MCG/ACT nasal spray   Nasal   Place 1 spray into the nose daily. Allergies   16 g   3   . gabapentin (NEURONTIN) 100 MG capsule   Oral   Take  1 capsule (100 mg total) by mouth 3 (three) times daily.   90 capsule   2   . hydrochlorothiazide (HYDRODIURIL) 12.5 MG tablet   Oral   Take 12.5 mg by mouth daily.         [name] lamoTRIgine (LAMICTAL) 100 MG tablet    Oral   Take 100 mg by mouth daily.          [name] levothyroxine (LEVOXYL) 50 MCG tablet   Oral   Take 50 mcg by mouth every morning.          [name] Linaclotide (LINZESS) 290 MCG CAPS   Oral   Take 1 capsule by mouth daily.   30 capsule   3   . LORazepam (ATIVAN) 1 MG tablet   Oral   Take 1 mg by mouth 4 (four) times daily.          . meclizine (ANTIVERT) 12.5 MG tablet   Oral   Take 12.5 mg by mouth 3 (three) times daily as needed.         . methocarbamol (ROBAXIN) 500 MG tablet      500 mg daily.          . metoprolol (LOPRESSOR) 50 MG tablet   Oral   Take 50 mg by mouth 2 (two) times daily.         . mirtazapine (REMERON) 30 MG tablet   Oral   Take 30 mg by mouth at bedtime.          . montelukast (SINGULAIR) 10 MG tablet   Oral   Take 10 mg by mouth daily.         [name] morphine (MSIR) 15 MG tablet   Oral   Take 15 mg by mouth 2 (two) times daily as needed. For pain         . naproxen (NAPROSYN) 500 MG tablet   Oral   Take 1 tablet (500 mg total) by mouth 2 (two) times daily with a meal.   30 tablet   0   . nystatin (MYCOSTATIN) powder   Topical   Apply topically 2 (two) times daily.         . polyethylene glycol-electrolytes (TRILYTE) 420 G solution   Oral   Take 4,000 mLs by mouth as directed.   4000 mL   0   . polyethylene glycol-electrolytes (TRILYTE) 420 G solution   Oral   Take 4,000 mLs by mouth as directed.   4000 mL   0   . pregabalin (LYRICA) 75 MG capsule   Oral   Take 75 mg by mouth daily.         . RABEprazole (ACIPHEX) 20 MG tablet   Oral   Take 1 tablet (20 mg total) by mouth 2 (two) times daily.   60 tablet   11   . rosuvastatin (CRESTOR) 5 MG tablet   Oral   Take 1 tablet (5 mg total) by mouth at bedtime.   30 tablet   11     Dose reduction effective 06/14/2012   . saxagliptin HCl (ONGLYZA) 2.5 MG TABS tablet   Oral   Take 1 tablet (2.5 mg total) by mouth daily.   30 tablet   5     Resume onglyza  effective 10/18/2012  Pt will nee ...   . sertraline (ZOLOFT) 100 MG tablet   Oral   Take 150 mg by mouth at bedtime.          [name]  thioridazine (MELLARIL) 10 MG tablet               . thiothixene (NAVANE) 2 MG capsule   Oral   Take 2 mg by mouth at bedtime.         . traZODone (DESYREL) 50 MG tablet      as needed.          . Vitamin D, Ergocalciferol, (DRISDOL) 50000 UNITS CAPS      TAKE 1 CAPSULE BY MOUTH ONCE WEEKLY.   4 capsule   2    BP 137/62  Pulse 68  Temp(Src) 98.2 F (36.8 C) (Oral)  Resp 16  Ht 5\' 2"  (1.575 m)  Wt 187 lb (84.823 kg)  BMI 34.19 kg/m2  SpO2 100%  Physical Exam  Nursing note and vitals reviewed. Constitutional: She appears well-developed and well-nourished. No distress.  HENT:  Head: Normocephalic.  Mouth/Throat: Oropharynx is clear and moist. No oropharyngeal exudate.  Right periorbital hematoma No malocclusion No hemotympanum  Eyes: Conjunctivae and EOM are normal. Pupils are equal, round, and reactive to light. Right eye exhibits no discharge. Left eye exhibits no discharge. No scleral icterus.  Neck: Normal range of motion. Neck supple. No JVD present. No thyromegaly present.  Cardiovascular: Normal rate, regular rhythm, normal heart sounds and intact distal pulses.  Exam reveals no gallop and no friction rub.   No murmur heard. Pulmonary/Chest: Effort normal and breath sounds normal. No respiratory distress. She has no wheezes. She has no rales.  Abdominal: Soft. Bowel sounds are normal. She exhibits no distension and no mass. There is no tenderness.  Musculoskeletal: Normal range of motion. She exhibits no edema and no tenderness.  Lymphadenopathy:    She has no cervical adenopathy.  Neurological: She is alert. Coordination normal.  Skin: Skin is warm and dry. No rash noted. No erythema.  Psychiatric: She has a normal mood and affect. Her behavior is normal.    ED Course  Procedures (including critical care  time)  DIAGNOSTIC STUDIES: Oxygen Saturation is 100% on room air, normal by my interpretation.    COORDINATION OF CARE: 11:42 AM-Discussed treatment plan which includes pain medication and imaging with pt at bedside and pt agreed to plan.    Labs Review Labs Reviewed - No data to display  Imaging Review Ct Head Wo Contrast  11/15/2012   *RADIOLOGY REPORT*  Clinical Data:  Fall, severe headache; history of prior craniotomy for meningioma resection and cerebrovascular accident  CT HEAD WITHOUT CONTRAST CT MAXILLOFACIAL WITHOUT CONTRAST CT CERVICAL SPINE WITHOUT CONTRAST  Technique:  Multidetector CT imaging of the head, cervical spine, and maxillofacial structures were performed using the standard protocol without intravenous contrast. Multiplanar CT image reconstructions of the cervical spine and maxillofacial structures were also generated.  Comparison:   Most recent prior CT head and cervical spine 08/26/2012  CT HEAD  Findings: No acute intracranial hemorrhage, acute infarction, mass lesion, mass effect, midline shift or hydrocephalus.  Gray-white differentiation is preserved throughout.  Stable frontal encephalomalacia in the region of prior meningioma resection. The name karam focal soft tissue/scalp hematoma overlying the right frontal orbital ridge and writes pre orbital soft tissues.  The underlying globe and orbits appear intact and unremarkable.  Intact frontal craniotomy with metallic fixation plates.  No acute fracture identified.  Normal aeration of the mastoid air cells and visualized paranasal sinuses.  Atherosclerotic calcifications noted throughout the cavernous carotid arteries.  IMPRESSION:  1.  No acute intracranial abnormality. 2.  Right periorbital  and frontal scalp hematoma/soft tissue contusion without evidence of underlying orbital extension or fracture. 3.  Stable appearance of frontal craniotomy and surgical changes of prior meningioma resection. 4.  Intracranial internal  carotid atherosclerosis.  CT MAXILLOFACIAL  Findings:  Soft tissue hematoma/contusion overlying the right frontal orbital ridge and the preseptal right periorbital soft tissues.  The globes and orbits are intact and symmetric.  The right zygoma and orbit are intact without evidence of acute fracture.  Normal aeration of the mastoid air cells and paranasal sinuses.  The remainder the visualized bones and soft tissues are unremarkable without evidence of acute injury. Partial calcification of the stylohyoid ligament bilaterally noted incidentally.  The patient is edentulous.  Residual frontal calvarial hyperostosis.  IMPRESSION:  Right preseptal periorbital soft tissue contusion without evidence of underlying fracture, intraorbital extension or globe injury.  CT CERVICAL SPINE  Findings:   No acute fracture, malalignment or prevertebral soft tissue swelling.  No acute soft tissue abnormality. Mild multilevel cervical spondylosis without focality.  Unremarkable appearance of the thyroid gland by CT.  Unremarkable visualized lung apices.  No suspicious adenopathy.  Fatty infiltration of the bilateral parotid glands noted incidentally.  IMPRESSION: No acute fracture or malalignment.   Original Report Authenticated By: Malachy Moan, M.D.   Ct Cervical Spine Wo Contrast  11/15/2012   *RADIOLOGY REPORT*  Clinical Data:  Fall, severe headache; history of prior craniotomy for meningioma resection and cerebrovascular accident  CT HEAD WITHOUT CONTRAST CT MAXILLOFACIAL WITHOUT CONTRAST CT CERVICAL SPINE WITHOUT CONTRAST  Technique:  Multidetector CT imaging of the head, cervical spine, and maxillofacial structures were performed using the standard protocol without intravenous contrast. Multiplanar CT image reconstructions of the cervical spine and maxillofacial structures were also generated.  Comparison:   Most recent prior CT head and cervical spine 08/26/2012  CT HEAD  Findings: No acute intracranial hemorrhage, acute  infarction, mass lesion, mass effect, midline shift or hydrocephalus.  Gray-white differentiation is preserved throughout.  Stable frontal encephalomalacia in the region of prior meningioma resection. The name karam focal soft tissue/scalp hematoma overlying the right frontal orbital ridge and writes pre orbital soft tissues.  The underlying globe and orbits appear intact and unremarkable.  Intact frontal craniotomy with metallic fixation plates.  No acute fracture identified.  Normal aeration of the mastoid air cells and visualized paranasal sinuses.  Atherosclerotic calcifications noted throughout the cavernous carotid arteries.  IMPRESSION:  1.  No acute intracranial abnormality. 2.  Right periorbital and frontal scalp hematoma/soft tissue contusion without evidence of underlying orbital extension or fracture. 3.  Stable appearance of frontal craniotomy and surgical changes of prior meningioma resection. 4.  Intracranial internal carotid atherosclerosis.  CT MAXILLOFACIAL  Findings:  Soft tissue hematoma/contusion overlying the right frontal orbital ridge and the preseptal right periorbital soft tissues.  The globes and orbits are intact and symmetric.  The right zygoma and orbit are intact without evidence of acute fracture.  Normal aeration of the mastoid air cells and paranasal sinuses.  The remainder the visualized bones and soft tissues are unremarkable without evidence of acute injury. Partial calcification of the stylohyoid ligament bilaterally noted incidentally.  The patient is edentulous.  Residual frontal calvarial hyperostosis.  IMPRESSION:  Right preseptal periorbital soft tissue contusion without evidence of underlying fracture, intraorbital extension or globe injury.  CT CERVICAL SPINE  Findings:   No acute fracture, malalignment or prevertebral soft tissue swelling.  No acute soft tissue abnormality. Mild multilevel cervical spondylosis without focality.  Unremarkable appearance of  the thyroid  gland by CT.  Unremarkable visualized lung apices.  No suspicious adenopathy.  Fatty infiltration of the bilateral parotid glands noted incidentally.  IMPRESSION: No acute fracture or malalignment.   Original Report Authenticated By: Malachy Moan, M.D.   Ct Maxillofacial Wo Cm  11/15/2012   *RADIOLOGY REPORT*  Clinical Data:  Fall, severe headache; history of prior craniotomy for meningioma resection and cerebrovascular accident  CT HEAD WITHOUT CONTRAST CT MAXILLOFACIAL WITHOUT CONTRAST CT CERVICAL SPINE WITHOUT CONTRAST  Technique:  Multidetector CT imaging of the head, cervical spine, and maxillofacial structures were performed using the standard protocol without intravenous contrast. Multiplanar CT image reconstructions of the cervical spine and maxillofacial structures were also generated.  Comparison:   Most recent prior CT head and cervical spine 08/26/2012  CT HEAD  Findings: No acute intracranial hemorrhage, acute infarction, mass lesion, mass effect, midline shift or hydrocephalus.  Gray-white differentiation is preserved throughout.  Stable frontal encephalomalacia in the region of prior meningioma resection. The name karam focal soft tissue/scalp hematoma overlying the right frontal orbital ridge and writes pre orbital soft tissues.  The underlying globe and orbits appear intact and unremarkable.  Intact frontal craniotomy with metallic fixation plates.  No acute fracture identified.  Normal aeration of the mastoid air cells and visualized paranasal sinuses.  Atherosclerotic calcifications noted throughout the cavernous carotid arteries.  IMPRESSION:  1.  No acute intracranial abnormality. 2.  Right periorbital and frontal scalp hematoma/soft tissue contusion without evidence of underlying orbital extension or fracture. 3.  Stable appearance of frontal craniotomy and surgical changes of prior meningioma resection. 4.  Intracranial internal carotid atherosclerosis.  CT MAXILLOFACIAL  Findings:  Soft  tissue hematoma/contusion overlying the right frontal orbital ridge and the preseptal right periorbital soft tissues.  The globes and orbits are intact and symmetric.  The right zygoma and orbit are intact without evidence of acute fracture.  Normal aeration of the mastoid air cells and paranasal sinuses.  The remainder the visualized bones and soft tissues are unremarkable without evidence of acute injury. Partial calcification of the stylohyoid ligament bilaterally noted incidentally.  The patient is edentulous.  Residual frontal calvarial hyperostosis.  IMPRESSION:  Right preseptal periorbital soft tissue contusion without evidence of underlying fracture, intraorbital extension or globe injury.  CT CERVICAL SPINE  Findings:   No acute fracture, malalignment or prevertebral soft tissue swelling.  No acute soft tissue abnormality. Mild multilevel cervical spondylosis without focality.  Unremarkable appearance of the thyroid gland by CT.  Unremarkable visualized lung apices.  No suspicious adenopathy.  Fatty infiltration of the bilateral parotid glands noted incidentally.  IMPRESSION: No acute fracture or malalignment.   Original Report Authenticated By: Malachy Moan, M.D.    MDM   1. Contusion of face, initial encounter   2. Minor head injury, initial encounter    The patient does have swelling and bruising around her right eye, the actual orbit appears normal with minimal tenderness around the superior orbital rim. There is no signs of hemotympanum or other signs of intracranial injury and the CT scan shows no signs of intracranial hemorrhage, spinal fracture, maxillofacial fracture. Ice packs, anti-inflammatories, followup with family doctor as needed   Meds given in ED:  Medications  oxyCODONE-acetaminophen (PERCOCET/ROXICET) 5-325 MG per tablet 2 tablet (2 tablets Oral Given 11/15/12 1156)    New Prescriptions   NAPROXEN (NAPROSYN) 500 MG TABLET    Take 1 tablet (500 mg total) by mouth 2  (two) times daily with a meal.  I personally performed the services described in this documentation, which was scribed in my presence. The recorded information has been reviewed and is accurate.      Vida Roller, MD 11/15/12 1310

## 2012-11-18 ENCOUNTER — Ambulatory Visit (HOSPITAL_COMMUNITY)
Admission: RE | Admit: 2012-11-18 | Discharge: 2012-11-18 | Disposition: A | Discharge status: Home / Self Care | Payer: Medicare Other | Source: Ambulatory Visit | Attending: Orthopedic Surgery | Admitting: Orthopedic Surgery

## 2012-11-18 ENCOUNTER — Other Ambulatory Visit (HOSPITAL_COMMUNITY): Payer: Medicare Other

## 2012-11-18 ENCOUNTER — Other Ambulatory Visit: Payer: Self-pay | Admitting: *Deleted

## 2012-11-18 ENCOUNTER — Encounter (HOSPITAL_COMMUNITY): Payer: Self-pay

## 2012-11-18 DIAGNOSIS — M5030 Other cervical disc degeneration, unspecified cervical region: Secondary | ICD-10-CM | POA: Insufficient documentation

## 2012-11-18 DIAGNOSIS — M5020 Other cervical disc displacement, unspecified cervical region: Secondary | ICD-10-CM | POA: Insufficient documentation

## 2012-11-18 DIAGNOSIS — IMO0002 Reserved for concepts with insufficient information to code with codable children: Secondary | ICD-10-CM | POA: Insufficient documentation

## 2012-11-18 DIAGNOSIS — M5010 Cervical disc disorder with radiculopathy, unspecified cervical region: Secondary | ICD-10-CM

## 2012-11-18 DIAGNOSIS — M542 Cervicalgia: Secondary | ICD-10-CM | POA: Insufficient documentation

## 2012-11-18 DIAGNOSIS — M25519 Pain in unspecified shoulder: Secondary | ICD-10-CM | POA: Insufficient documentation

## 2012-11-18 MED ORDER — METOPROLOL TARTRATE 50 MG PO TABS: 50.0000 mg | ORAL_TABLET | Freq: Two times a day (BID) | ORAL | Status: DC

## 2012-11-18 NOTE — Telephone Encounter (Signed)
Rx was sent to pharmacy electronically. 

## 2012-11-19 ENCOUNTER — Encounter: Payer: Self-pay | Admitting: Internal Medicine

## 2012-11-19 ENCOUNTER — Ambulatory Visit (INDEPENDENT_AMBULATORY_CARE_PROVIDER_SITE_OTHER): Payer: Medicare Other | Admitting: Internal Medicine

## 2012-11-19 VITALS — BP 114/67 | HR 67 | Ht 66.0 in | Wt 144.0 lb

## 2012-11-19 DIAGNOSIS — G4733 Obstructive sleep apnea (adult) (pediatric): Secondary | ICD-10-CM

## 2012-11-19 DIAGNOSIS — G4700 Insomnia, unspecified: Secondary | ICD-10-CM

## 2012-11-19 IMAGING — CT CT HEAD W/O CM
2 of 3 series · 14 of 30 positions shown, 17 images · non-contrast
Comparison: CT 12/12/2010

CLINICAL DATA: Altered mental status.  Lethargic.  Diabetes and
hypertension.

CT HEAD WITHOUT CONTRAST
TECHNIQUE: Contiguous axial images were obtained from the base of
the skull through the vertex without contrast.

[Series 3: headseq 4.8 h37s · axial · 0.43mm/px · z∈[+24,+53]mm · 3 of 12 slices shown]
[im 3/12  brain]
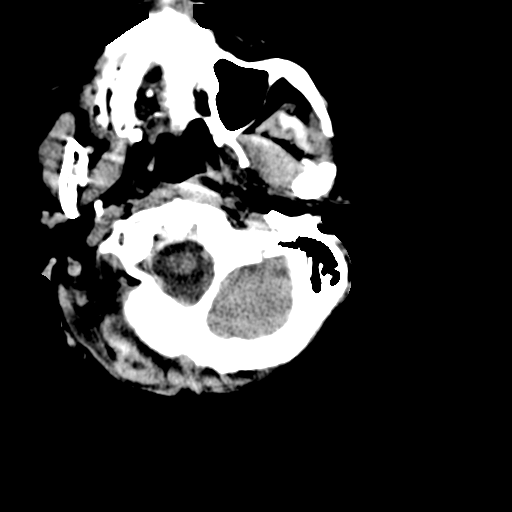
[im 6/12  brain]
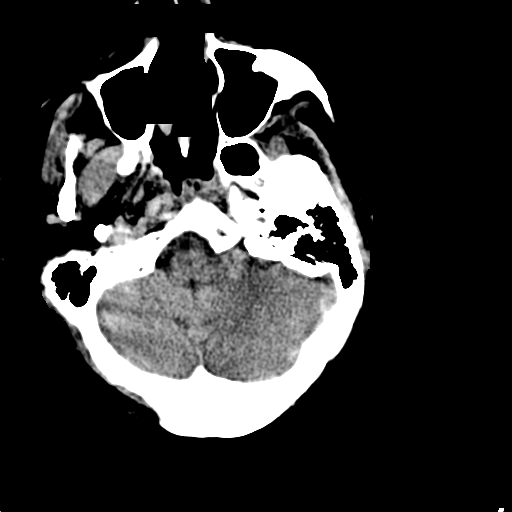
[im 9/12  brain]
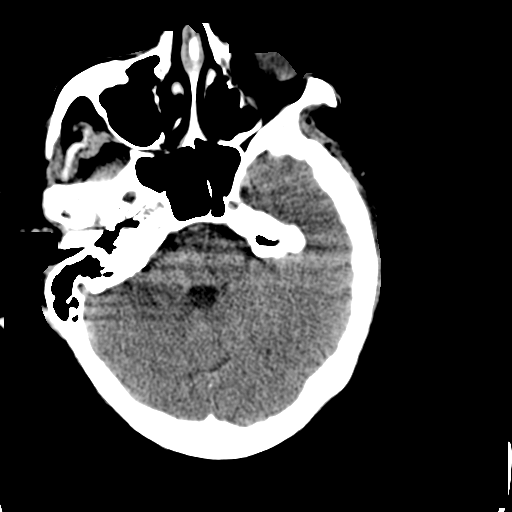

[Series 4: headseq 4.8 h60s · axial · 0.43mm/px · z∈[+40,+182]mm · 11 of 35 slices shown, 14 images]
[im 3/35  brain]
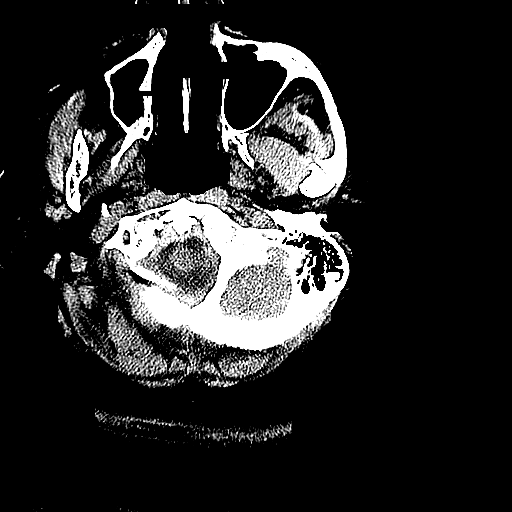
[im 3/35  bone]
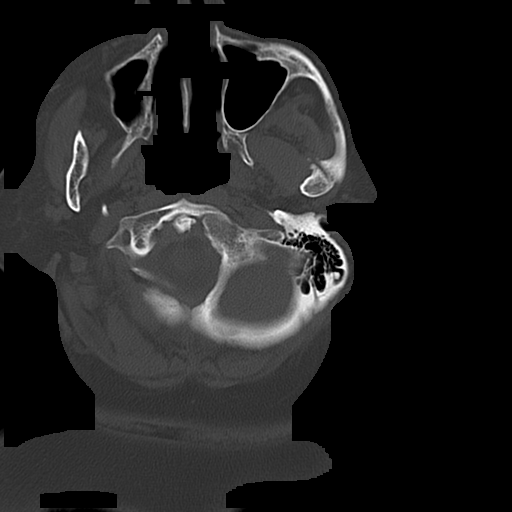
[im 6/35  brain]
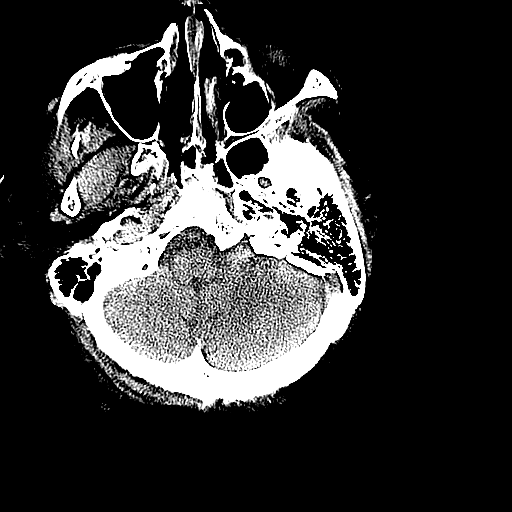
[im 9/35  brain]
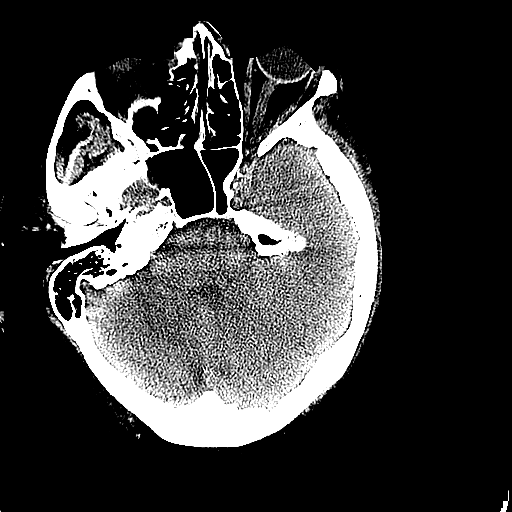
[im 12/35  brain]
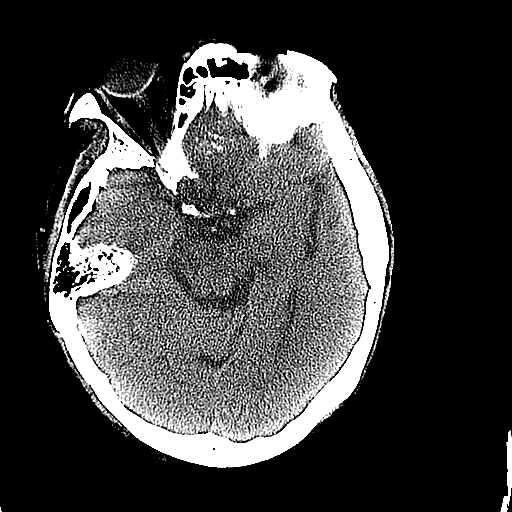
[im 15/35  brain]
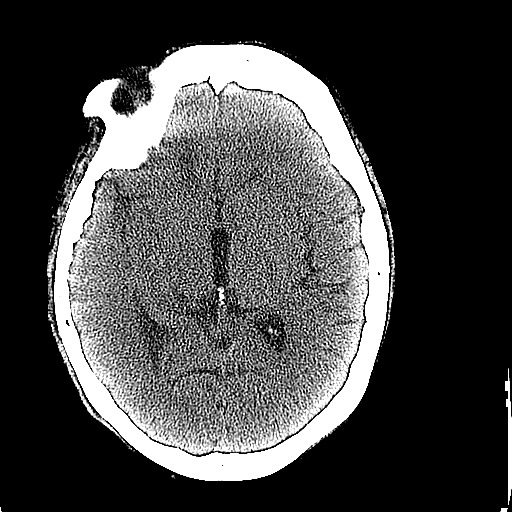
[im 15/35  bone]
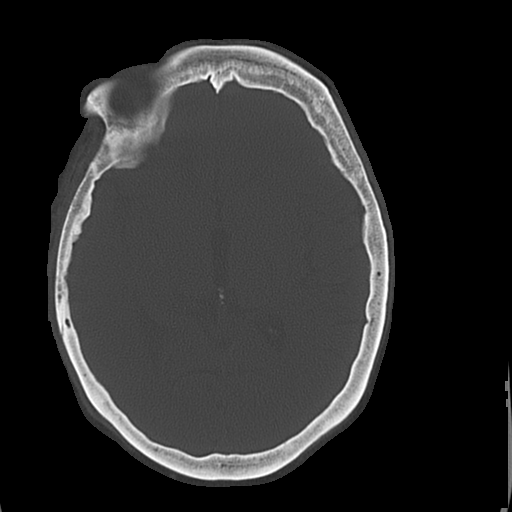
[im 18/35  brain]
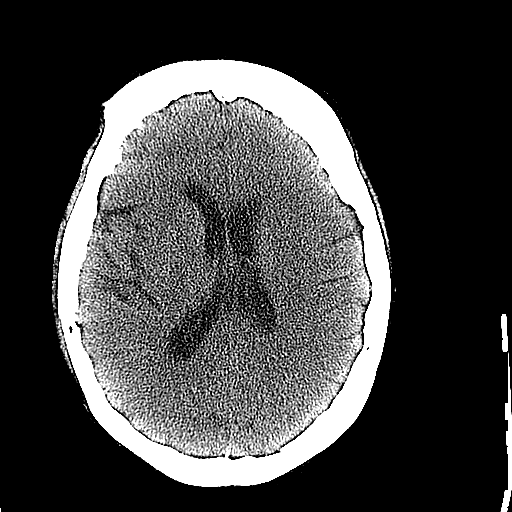
[im 20/35  brain]
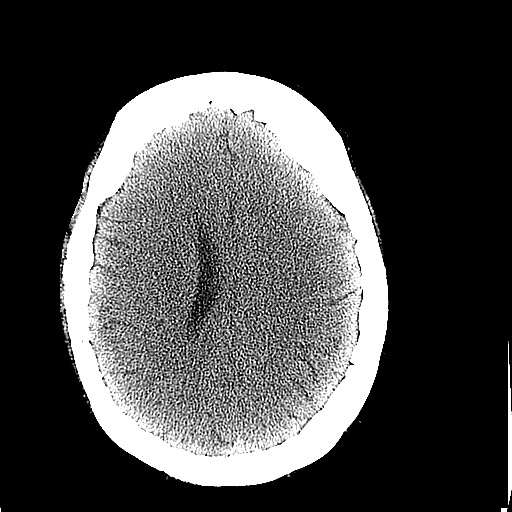
[im 23/35  brain]
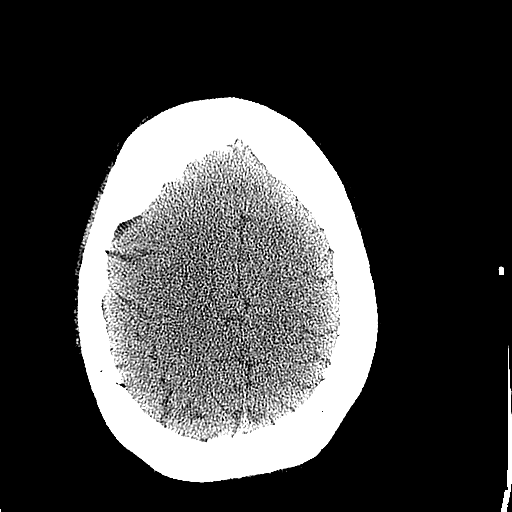
[im 26/35  brain]
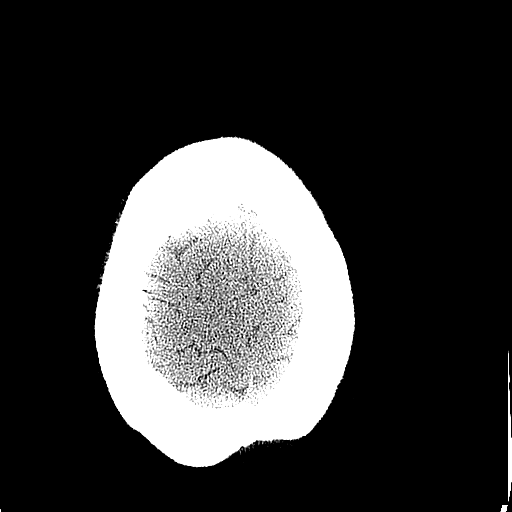
[im 26/35  bone]
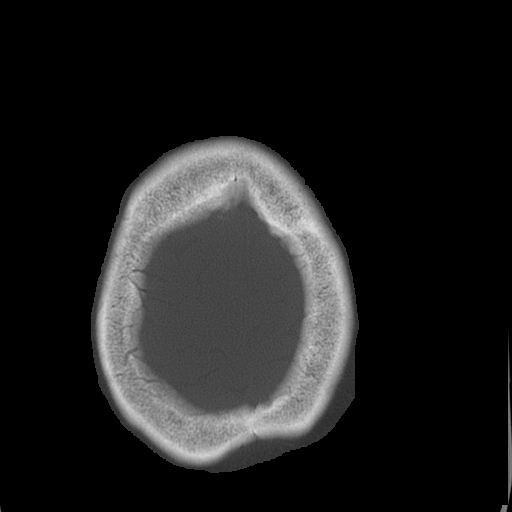
[im 29/35  brain]
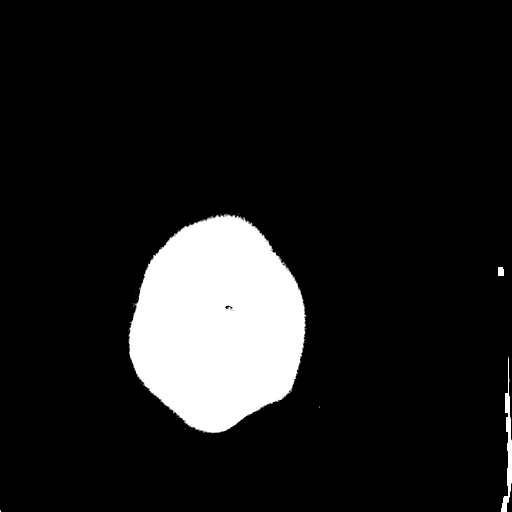
[im 32/35  brain]
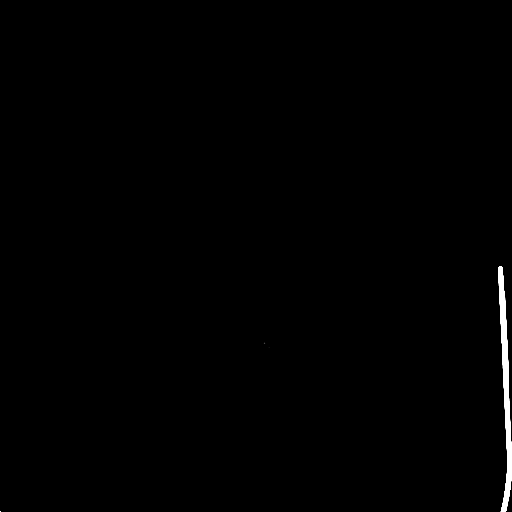

[14 of 30 positions shown; findings below may reference images not displayed]

FINDINGS: Ventricle size is normal.  Negative for hemorrhage.  No
acute infarct.

Small calcifications are present above the cribriform plate.  There
may be associated with hyperdensity in this area.  This is
unchanged from the prior study.  This could represent a mass lesion
such as meningioma.  No significant brain edema is present.
Further evaluation with MRI is suggested.

Calvarium is intact.  Paranasal sinuses are clear.
IMPRESSION: Negative for acute infarct.

Small calcifications above the cribriform plate are unchanged from
the  prior study.  Suspicion for underlying meningioma.  MRI brain
without and with contrast is suggested for further evaluation.

## 2012-11-19 NOTE — Progress Notes (Signed)
06/18/12- 55 yoF referred courtesy of Dr Joycelyn Schmid [name]-sleep study last year per patient. She reports that she was witnessed stopping breathing in sleep, has a history of loud snoring and is a restless sleeper with frequent waking at night, bathroom trips and nightmares. Easy dyspnea on exertion during the day. She does smoke. New cough and sinus pressure this week attributed to "allergy" with green nasal discharge and no headache. She was sent to hospital April 1 when she says she couldn't wake up while at her doctor's office. She sleeps propped up. Gets sleepy in 30 minutes watching TV during the day. Bedtime 10 PM, sleep latency one or 2 hours, waking during the night 3 or 4 times before up at 8 AM. She had a sleep study at [facility] and was given CPAP but says she can't use it because the machine started smoking and she didn't like the pressure. She was changed from [place] to [place]. She is living alone, divorced and disabled. Son has OSA. History of meningioma 11/23/2011. No seizure in over 3 years. Does not drive. NPSG 3/8/13[name]- mild obstructive sleep apnea, AHI 14 per hour, mostly in REM. Epworth sleepiness score 14/24. Weight 164 pounds.  09/26/12- 48 yoF  Former smoker followed for obstructive sleep apnea follows for:  review download from [place] with pt today.  pt stated that her back has been hurting her.   Did stop smoking! Some wheeze with exertion. She complains that she is falling asleep before she puts her CPAP/ auto/ [employer] on. Download demonstrated poor compliance and control. She had complained of a long history of difficulty falling asleep. Says a neurologist had questioned narcolepsy. She denies cataplexy or sleep paralysis.  11/19/12-62 yoF  Former smoker followed for obstructive sleep apnea  CXR 06/18/12 IMPRESSION:  No acute cardiopulmonary disease.  Mild right hemidiaphragm elevation with right infrahilar volume  loss/atelectasis.  Original Report  Authenticated By: Abigail Miyamoto, M.D.  ROS-see HPI Constitutional:   No-   weight loss, night sweats, fevers, chills, +fatigue, lassitude. HEENT:  + headaches, +difficulty swallowing, tooth/dental problems, sore throat,       +  sneezing, itching, ear ache, +nasal congestion, post nasal drip,  CV:  No-   chest pain, orthopnea, PND, swelling in lower extremities, anasarca, dizziness, palpitations Resp: +  shortness of breath with exertion or at rest.              +  productive cough,  No non-productive cough,  No- coughing up of blood.              No-   change in color of mucus.  No- wheezing.   Skin: No-   rash or lesions. GI:  No-   heartburn, +indigestion, +abdominal pain, nausea, vomiting,  GU:  MS:  No-   joint pain or swelling.   Neuro-     nothing unusual Psych:  No- change in mood or affect. No depression or anxiety.  No memory loss.  OBJ- Physical Exam - +extremely drowsy during conversation General-  Oriented, Affect-flat, Distress- none acute Skin- rash-none, lesions- none, excoriation- none Lymphadenopathy- none Head- atraumatic            Eyes- Gross vision intact, PERRLA, conjunctivae and secretions clear            Ears- Hearing, canals-normal            Nose- Clear, no-Septal dev, mucus, polyps, erosion, perforation  Throat- Mallampati III , mucosa clear , drainage- none, tonsils- atrophic, + dentures Neck- flexible , trachea midline, no stridor , thyroid nl, carotid no bruit Chest - symmetrical excursion , unlabored           Heart/CV- RRR , no murmur , no gallop  , no rub, nl s1 s2                           - JVD- none , edema- none, stasis changes- none, varices- none           Lung- clear to P&A, wheeze- none, cough+rattling , dullness-none, rub- none           Chest wall-  Abd- soft Br/ Gen/ Rectal- Not done, not indicated Extrem- cyanosis- none, clubbing, none, atrophy- none, strength- nl. cane Neuro- lethargic but responsive.     06/18/12- 52 yoF  referred courtesy of Dr Joycelyn Schmid [name]-sleep study last year per patient. She reports that she was witnessed stopping breathing in sleep, has a history of loud snoring and is a restless sleeper with frequent waking at night, bathroom trips and nightmares. Easy dyspnea on exertion during the day. She does smoke. New cough and sinus pressure this week attributed to "allergy" with green nasal discharge and no headache. She was sent to hospital April 1 when she says she couldn't wake up while at her doctor's office. She sleeps propped up. Gets sleepy in 30 minutes watching TV during the day. Bedtime 10 PM, sleep latency one or 2 hours, waking during the night 3 or 4 times before up at 8 AM. She had a sleep study at [facility] and was given CPAP but says she can't use it because the machine started smoking and she didn't like the pressure. She was changed from [place] to [place]. She is living alone, divorced and disabled. Son has OSA. History of meningioma 11/23/2011. No seizure in over 3 years. Does not drive. NPSG 3/8/13[name]- mild obstructive sleep apnea, AHI 14 per hour, mostly in REM. Epworth sleepiness score 14/24. Weight 164 pounds.  09/26/12- 28 yoF  Former smoker followed for obstructive sleep apnea follows for:  review download from [place] with pt today.  pt stated that her back has been hurting her.   Did stop smoking! Some wheeze with exertion. She complains that she is falling asleep before she puts her CPAP/ auto/ [employer] on. Download demonstrated poor compliance and control. She had complained of a long history of difficulty falling asleep. Says a neurologist had questioned narcolepsy. She denies cataplexy or sleep paralysis.  11/19/12- 7 yoF  Former smoker followed for obstructive sleep apnea, ? Lung nodule, complicated by hx resection of meningioma FOLLOWS FOR:  Wearing CPAP 8/ [employer] 6-8 hours per night.  Discuss lung nodule was told she had by Dr. Tula Nakayama Sleep is comfortable with CPAP. Nuvigil sample did not help with daytime alertness. There was questionable lung nodule but not seen on recent CT chest. Incidental slip in bathroom last week- black eye. CT 08/19/12 IMPRESSION:  The opacity in the inferior segment of the lingula of the left  upper lobe is without significant change. It is morphology is  consistent with scarring and / or atelectasis. This does not have  the configuration of a nodule. Other minor areas of reticular  subsegmental atelectasis are stable as well. There is no discrete  lung nodule. The lungs are otherwise clear.  No additional short-term  follow-up is felt indicated. There are no  findings suspicious for neoplastic disease.  Original Report Authenticated By: Amie Portland, M.D.  ROS-see HPI Constitutional:   No-   weight loss, night sweats, fevers, chills, +fatigue, lassitude. HEENT:  + headaches, +difficulty swallowing, tooth/dental problems, sore throat,       +  sneezing, itching, ear ache, +nasal congestion, post nasal drip,  CV:  No-   chest pain, orthopnea, PND, swelling in lower extremities, anasarca, dizziness, palpitations Resp: +  shortness of breath with exertion or at rest.              +  productive cough,  No non-productive cough,  No- coughing up of blood.              No-   change in color of mucus.  No- wheezing.   Skin: No-   rash or lesions. GI:  No-   heartburn, +indigestion, no-abdominal pain, nausea, vomiting,  GU:  MS:  No-   joint pain or swelling.   Neuro-     nothing unusual Psych:  No- change in mood or affect. No depression or anxiety.  No memory loss.  OBJ- Physical Exam - + drowsy during conversation- better than last visit General-  Oriented, Affect-flat, Distress- none acute Skin- rash-none, lesions- none, excoriation- none Lymphadenopathy- none Head- + R black eye            Eyes- Gross vision intact, PERRLA, conjunctivae and secretions clear            Ears- Hearing,  canals-normal            Nose- Clear, no-Septal dev, mucus, polyps, erosion, perforation             Throat- Mallampati III , mucosa clear , drainage- none, tonsils- atrophic, + dentures Neck- flexible , trachea midline, no stridor , thyroid nl, carotid no bruit Chest - symmetrical excursion , unlabored           Heart/CV- RRR , no murmur , no gallop  , no rub, nl s1 s2                           - JVD- none , edema- none, stasis changes- none, varices- none           Lung- clear to P&A, wheeze- none, cough+rattling , dullness-none, rub- none           Chest wall-  Abd- soft Br/ Gen/ Rectal- Not done, not indicated Extrem- cyanosis- none, clubbing, none, atrophy- none, strength- nl. +cane Neuro- + somewhat slow but oriented.

## 2012-11-19 NOTE — Patient Instructions (Addendum)
We can continue CPAP 8/ [employer]  Please call as needed  

## 2012-11-19 NOTE — Telephone Encounter (Signed)
Pt needs fasting lipid, cmp and EGFR and hBA1C all are overdue, pls order and lwet her know to get this done asap , thanks

## 2012-11-20 ENCOUNTER — Telehealth: Payer: Self-pay | Admitting: Family Medicine

## 2012-11-21 ENCOUNTER — Ambulatory Visit: Payer: Medicare Other | Admitting: Gastroenterology

## 2012-11-21 NOTE — Telephone Encounter (Signed)
Called patient and left message for them to return call at the office   

## 2012-11-22 NOTE — Telephone Encounter (Signed)
States her blood sugar has been high, 341 Thursday and its been running like this for a month now. Has appt next week and needs blood work and patient is aware

## 2012-11-25 ENCOUNTER — Telehealth: Payer: Self-pay

## 2012-11-25 NOTE — Telephone Encounter (Signed)
Patient aware.

## 2012-11-25 NOTE — Telephone Encounter (Signed)
States she just had an hba1c and labs from Dr Fransico Him (july 30) and insurance won't pay for them again. States she has been eating about the same which is small portions and few carbs and her sugar today was 187. Patient has appt Sept 18. Does she need any labs before she comes in?

## 2012-11-25 NOTE — Telephone Encounter (Signed)
pls advise her to increase the onglyza take 2.5mg  TWO tablets daily, and follow diet, when she comes in , oincreased dose will be written, she can take 2nd onglyza today

## 2012-11-26 ENCOUNTER — Ambulatory Visit (INDEPENDENT_AMBULATORY_CARE_PROVIDER_SITE_OTHER): Payer: Medicare Other | Admitting: Orthopedic Surgery

## 2012-11-26 VITALS — BP 143/68 | Ht 62.0 in | Wt 137.0 lb

## 2012-11-26 DIAGNOSIS — M5030 Other cervical disc degeneration, unspecified cervical region: Secondary | ICD-10-CM

## 2012-11-26 NOTE — Progress Notes (Signed)
Patient ID: [name], female   DOB: 14-Oct-1950, 62 y.o.   MRN: [id number]  Chief Complaint  Patient presents with  . Results    MRI results    Chief Complaint   Patient presents with   .  Follow-up       left shoulder and arm pain s/p injection 08/21/12     Recheck left shoulder and arm pain continued pain status post injection without improvement. Pain radiates from the neck down to the left shoulder  Previous CT scan shows scoliosis and C5-C6 C6-C7 degenerative disc disease  Still has weakness to his night pain still has loss of motion in the left shoulder as well  Ros NO CHANGE FROM VISIT 10-31-2012  She was therefore sent for MRI I AGREE WITH REPORT AFTER REVIEW OF THE FILMS   C5-6:  Diffuse annular bulge and central disc protrusion with mild mass effect on the ventral thecal sac and focal narrowing of the ventral CSF space.  Mild osteophytic ridging and uncinate spurring with mild foraminal encroachment bilaterally.   C6-7:  Bulging annulus, osteophytic ridging and uncinate spurring but no significant spinal stenosis.  There is mild left greater than right foraminal stenosis.   C7-T1:  Degenerative disc disease with a shallow left paracentral and medial foraminal disc protrusion. There is also moderate facet disease.   IMPRESSION:   1.  Shallow central disc protrusion at C3-4. 2.  Degenerative disc disease and facet disease at C5-6 with a central disc protrusion. 3.  Shallow left paracentral and medial foraminal disc protrusion at C7-T1.   My recommendation for her is to see a neurosurgeon for further evaluation and treatment

## 2012-11-26 NOTE — Patient Instructions (Addendum)
Referral to [facility], [facility] (PREVIOUSLY SEEN FOR BRAIN SURGERY) for evaluation and treatment of cervical disc disease with spinal stenosis and ventral cord impingement  3 DEGENERATIVE DISCS  SPINAL CORD HAS MILD PRESSURE   NERVE PINCHED

## 2012-11-27 ENCOUNTER — Other Ambulatory Visit: Payer: Self-pay | Admitting: *Deleted

## 2012-11-27 ENCOUNTER — Ambulatory Visit: Payer: Medicare Other | Admitting: Gastroenterology

## 2012-11-27 ENCOUNTER — Telehealth: Payer: Self-pay | Admitting: *Deleted

## 2012-11-27 DIAGNOSIS — M5030 Other cervical disc degeneration, unspecified cervical region: Secondary | ICD-10-CM

## 2012-11-27 DIAGNOSIS — M4802 Spinal stenosis, cervical region: Secondary | ICD-10-CM

## 2012-11-27 NOTE — Telephone Encounter (Signed)
Faxed referral and office notes to [facility], Dr. Jule Ser. Awaiting appointment.

## 2012-11-27 NOTE — Telephone Encounter (Signed)
See next series of telephone messages.

## 2012-11-27 NOTE — Assessment & Plan Note (Signed)
She indicates that sleep quality is improved

## 2012-11-27 NOTE — Assessment & Plan Note (Signed)
Good compliance and control Plan-continue present CPAP settings

## 2012-11-28 ENCOUNTER — Encounter: Payer: Self-pay | Admitting: Family Medicine

## 2012-11-28 ENCOUNTER — Ambulatory Visit (INDEPENDENT_AMBULATORY_CARE_PROVIDER_SITE_OTHER): Payer: Medicare Other | Admitting: Family Medicine

## 2012-11-28 ENCOUNTER — Telehealth: Payer: Self-pay | Admitting: Family Medicine

## 2012-11-28 VITALS — BP 160/78 | HR 71 | Resp 16 | Wt 147.1 lb

## 2012-11-28 DIAGNOSIS — M5030 Other cervical disc degeneration, unspecified cervical region: Secondary | ICD-10-CM

## 2012-11-28 DIAGNOSIS — R5381 Other malaise: Secondary | ICD-10-CM

## 2012-11-28 DIAGNOSIS — R569 Unspecified convulsions: Secondary | ICD-10-CM

## 2012-11-28 DIAGNOSIS — N390 Urinary tract infection, site not specified: Secondary | ICD-10-CM

## 2012-11-28 DIAGNOSIS — R51 Headache: Secondary | ICD-10-CM

## 2012-11-28 DIAGNOSIS — M549 Dorsalgia, unspecified: Secondary | ICD-10-CM

## 2012-11-28 DIAGNOSIS — N3000 Acute cystitis without hematuria: Secondary | ICD-10-CM

## 2012-11-28 DIAGNOSIS — E1149 Type 2 diabetes mellitus with other diabetic neurological complication: Secondary | ICD-10-CM

## 2012-11-28 DIAGNOSIS — F060 Psychotic disorder with hallucinations due to known physiological condition: Secondary | ICD-10-CM

## 2012-11-28 DIAGNOSIS — R269 Unspecified abnormalities of gait and mobility: Secondary | ICD-10-CM

## 2012-11-28 DIAGNOSIS — D509 Iron deficiency anemia, unspecified: Secondary | ICD-10-CM

## 2012-11-28 DIAGNOSIS — E1140 Type 2 diabetes mellitus with diabetic neuropathy, unspecified: Secondary | ICD-10-CM

## 2012-11-28 DIAGNOSIS — E785 Hyperlipidemia, unspecified: Secondary | ICD-10-CM

## 2012-11-28 DIAGNOSIS — E1142 Type 2 diabetes mellitus with diabetic polyneuropathy: Secondary | ICD-10-CM

## 2012-11-28 DIAGNOSIS — I10 Essential (primary) hypertension: Secondary | ICD-10-CM

## 2012-11-28 DIAGNOSIS — E559 Vitamin D deficiency, unspecified: Secondary | ICD-10-CM

## 2012-11-28 DIAGNOSIS — Z23 Encounter for immunization: Secondary | ICD-10-CM

## 2012-11-28 DIAGNOSIS — E119 Type 2 diabetes mellitus without complications: Secondary | ICD-10-CM

## 2012-11-28 LAB — GLUCOSE, POCT (MANUAL RESULT ENTRY): POC Glucose: 173 mg/dl — AB (ref 70–99)

## 2012-11-28 MED ORDER — SAXAGLIPTIN HCL 5 MG PO TABS: ORAL_TABLET | ORAL | Status: DC

## 2012-11-28 MED ORDER — LOSARTAN POTASSIUM 25 MG PO TABS: 25.0000 mg | ORAL_TABLET | Freq: Every day | ORAL | Status: DC

## 2012-11-28 MED ORDER — KETOROLAC TROMETHAMINE 60 MG/2ML IM SOLN
60.0000 mg | Freq: Once | INTRAMUSCULAR | Status: AC
  Administered 2012-11-28: 60 mg via INTRAMUSCULAR

## 2012-11-28 NOTE — Progress Notes (Signed)
  Subjective:    Patient ID: [name], female    DOB: Jan 31, 1951, 62 y.o.   MRN: [id number]  HPI The PT is here for follow up and re-evaluation of chronic medical conditions, medication management and review of any available recent lab and radiology data.  Preventive health is updated, specifically  Cancer screening and Immunization.   Questions or concerns regarding consultations or procedures which the PT has had in the interim are  Addressed.Recently evaluated by neurologist, wishes to transfer care to [facility] office where she has been seen in the past. Orthopedics has referred her to neurosurgeon for eval of cervical disease causing RUE pain The PT denies any adverse reactions to current medications since the last visit.  Recently called in stating blood sugars were running high, notes improvement with recent increase in onglyza dose 2 day h/o urinary frequency and suprapubic pressure, denies fever, chills or flank pain     Review of Systems See HPI Denies recent fever or chills. Denies sinus pressure, nasal congestion, ear pain or sore throat. Denies chest congestion, productive cough or wheezing. Denies chest pains, palpitations and leg swelling Denies abdominal pain, nausea, vomiting,diarrhea or constipation.   Denies dysuria, frequency, hesitancy or incontinence. Chronic back pain  and limitation in mobility.Unchanged, though uses less pain med than prescribed Denies  seizures, does have upper extremity numbness and  Tingling.c/o intermittent headaches Denies uncontrolled  depression, anxiety or insomnia. Denies skin break down or rash.        Objective:   Physical Exam  Patient alert and oriented and in no cardiopulmonary distress.  HEENT: No facial asymmetry, EOMI, no sinus tenderness,  oropharynx pink and moist.  Neck supple no adenopathy.  Chest: Clear to auscultation bilaterally.  CVS: S1, S2 no murmurs, no S3.  ABD: Soft non tender. Bowel sounds  normal.  Ext: No edema  MS: Adequate though reduced  ROM spine, shoulders, hips and knees.  Skin: Intact, no ulcerations or rash noted.  Psych: Good eye contact, normal affect. Memory intact not anxious or depressed appearing.  CNS: CN 2-12 intact, power, normal throughout.       Assessment & Plan:

## 2012-11-28 NOTE — Telephone Encounter (Signed)
Wanted her meds delivered today. Called pharmacy for her. They were waiting on the onglyza. Resent it electronically

## 2012-11-28 NOTE — Patient Instructions (Addendum)
F/u 2nd week in October.  Flu vaccine today  New additional medication for blood pressure today, losartan 25 mg daily, continue clonidine as before   New dose of onglyza sent in 5 mg one daily  Please keep appt with Dr Jule Ser about your neck  I will refer you to the neurologist office in [place], transfer of care  Urine will be checked today  Fasting lipid, cmp and EGFr , vit D and hBA1C first week in October , before visit pLEASE

## 2012-11-29 LAB — POCT URINALYSIS DIPSTICK
Ketones, UA: NEGATIVE
Protein, UA: NEGATIVE
Spec Grav, UA: >=1.03
pH, UA: 5.5

## 2012-12-01 DIAGNOSIS — N390 Urinary tract infection, site not specified: Secondary | ICD-10-CM | POA: Insufficient documentation

## 2012-12-01 NOTE — Assessment & Plan Note (Addendum)
Recently seen in local neurology office , sttaes she has decided to transfer care to [facility], and requests referral No headache today, no recent seizures

## 2012-12-01 NOTE — Assessment & Plan Note (Signed)
Currently stable and improved control , followed by psychiatry

## 2012-12-01 NOTE — Assessment & Plan Note (Signed)
urinalytsis checked and is normal, pt reassured

## 2012-12-01 NOTE — Assessment & Plan Note (Signed)
C/o frequency and pressure x 2 days, urinalysis is normal

## 2012-12-01 NOTE — Assessment & Plan Note (Signed)
Uncontrolled, add losartan pt to folllow DASH and commit to daily physical activity as able

## 2012-12-01 NOTE — Assessment & Plan Note (Signed)
Unchanged, however pt reports not taking pain meds as prescribed, would make her sleepy, I advised she discuss further with prescriber and have dose reduced

## 2012-12-01 NOTE — Assessment & Plan Note (Signed)
Called in recently  reporting markedly elevated blood sugars, acucheck in office today within normal. Increased onglyza dose verbally 2 days ago, continue dose

## 2012-12-09 ENCOUNTER — Telehealth: Payer: Self-pay | Admitting: Family Medicine

## 2012-12-09 NOTE — Telephone Encounter (Signed)
Wanted to see about getting some diabetic shoes. States Dr Everitt Amber office is supposed to be faxing a form over for Dr Lodema Hong to sign. Told her to have them refax the form to [name].

## 2012-12-09 NOTE — Telephone Encounter (Signed)
Patient has appointment with DR. Nudelman December 13, 2012. Patient is aware.

## 2012-12-09 NOTE — Telephone Encounter (Signed)
I attempted to call patient and there was no answer.

## 2012-12-11 ENCOUNTER — Ambulatory Visit (INDEPENDENT_AMBULATORY_CARE_PROVIDER_SITE_OTHER): Payer: Medicare Other | Admitting: Gastroenterology

## 2012-12-11 ENCOUNTER — Telehealth: Payer: Self-pay | Admitting: Family Medicine

## 2012-12-11 ENCOUNTER — Encounter: Payer: Self-pay | Admitting: Gastroenterology

## 2012-12-11 VITALS — BP 123/64 | HR 71 | Temp 97.6°F | Ht 64.0 in | Wt 149.0 lb

## 2012-12-11 DIAGNOSIS — D649 Anemia, unspecified: Secondary | ICD-10-CM

## 2012-12-11 DIAGNOSIS — R1310 Dysphagia, unspecified: Secondary | ICD-10-CM

## 2012-12-11 DIAGNOSIS — K3184 Gastroparesis: Secondary | ICD-10-CM

## 2012-12-11 DIAGNOSIS — K5900 Constipation, unspecified: Secondary | ICD-10-CM

## 2012-12-11 NOTE — Telephone Encounter (Signed)
Noted  

## 2012-12-11 NOTE — Telephone Encounter (Signed)
Per radiology recommendation , NO

## 2012-12-11 NOTE — Assessment & Plan Note (Signed)
Despite reports of vomiting daily, patient also reports eating a bacon and egg sandwich this morning without difficulty. Also encouraging, she has gained 13 lbs since June. Hesitant to change/switch any medications right now due to polypharmacy. Could consider Marinol in future if necessary.

## 2012-12-11 NOTE — Assessment & Plan Note (Signed)
Resolved with empiric dilation.

## 2012-12-11 NOTE — Assessment & Plan Note (Signed)
Continue Linzess 290 mcg daily.  

## 2012-12-11 NOTE — Patient Instructions (Addendum)
Please have blood work drawn today.   I will be talking with Dr. Jena Gauss about the best way to proceed with the capsule study to look at your small intestines.  Start taking a probiotic over the counter like Digestive Advantage, [employer] Colon Health, [employer] probiotic.  Follow the gastroparesis diet. We will see you back in 6 weeks.   Gastroparesis  Gastroparesis is also called slowed stomach emptying (delayed gastric emptying). It is a condition in which the stomach takes too long to empty its contents. It often happens in people with diabetes.  CAUSES  Gastroparesis happens when nerves to the stomach are damaged or stop working. When the nerves are damaged, the muscles of the stomach and intestines do not work normally. The movement of food is slowed or stopped. High blood glucose (sugar) causes changes in nerves and can damage the blood vessels that carry oxygen and nutrients to the nerves. RISK FACTORS  Diabetes.  Post-viral syndromes.  Eating disorders (anorexia, bulimia).  Surgery on the stomach or vagus nerve.  Gastroesophageal reflux disease (rarely).  Smooth muscle disorders (amyloidosis, scleroderma).  Metabolic disorders, including hypothyroidism.  Parkinson's disease. SYMPTOMS   Heartburn.  Feeling sick to your stomach (nausea).  Vomiting of undigested food.  An early feeling of fullness when eating.  Weight loss.  Abdominal bloating.  Erratic blood glucose levels.  Lack of appetite.  Gastroesophageal reflux.  Spasms of the stomach wall. Complications can include:  Bacterial overgrowth in stomach. Food stays in the stomach and can ferment and cause bacteria to grow.  Weight loss due to difficulty digesting and absorbing nutrients.  Vomiting.  Obstruction in the stomach. Undigested food can harden and cause nausea and vomiting.  Blood glucose fluctuations caused by inconsistent food absorption. DIAGNOSIS  The diagnosis of gastroparesis is  confirmed through one or more of the following tests:  Barium X-rays and scans. These tests look at how long it takes for food to move through the stomach.  Gastric manometry. This test measures electrical and muscular activity in the stomach. A thin tube is passed down the throat into the stomach. The tube contains a wire that takes measurements of the stomach's electrical and muscular activity as it digests liquids and solid food.  Endoscopy. This procedure is done with a long, thin tube called an endoscope. It is passed through the mouth and gently guides down the esophagus into the stomach. This tube helps the caregiver look at the lining of the stomach to check for any abnormalities.  Ultrasound. This can rule out gallbladder disease or pancreatitis. This test will outline and define the shape of the gallbladder and pancreas. TREATMENT   The primary treatment is to identify the problem and help control blood glucose levels. Treatments include:  Exercise.  Medicines to control nausea and vomiting.  Medicines to stimulate stomach muscles.  Changes in what and when you eat.  Having smaller meals more often.  Eating low-fiber forms of high-fiber foods, such aseating cooked vegetables instead of raw vegetables.  Eating low-fat foods.  Consuming liquids, which are easier to digest.  In severe cases, feeding tubes and intravenous (IV) feeding may be needed. It is important to note that in most cases, treatment does not cure gastroparesis. It is usually a lasting (chronic) condition. Treatment helps you manage the condition so that you can be as healthy and comfortable as possible. NEW TREATMENTS  A gastric neurostimulator has been developed to assist people with gastroparesis. The battery-operated device is surgically implanted. It emits  mild electrical pulses to help improve stomach emptying and to control nausea and vomiting.  The use of botulinum toxin has been shown to improve  stomach emptying by decreasing the prolonged contractions of the muscle between the stomach and the small intestine (pyloric sphincter). The benefits are temporary. SEEK MEDICAL CARE IF:   You are having problems keeping your blood glucose in goal range.  You are having nausea, vomiting, bloating, or early feelings of fullness with eating.  Your symptoms do not change with a change in diet. Document Released: 02/27/2005 Document Revised: 05/22/2011 Document Reviewed: 08/06/2008 [facility] Patient Information [address], [place].

## 2012-12-11 NOTE — Progress Notes (Signed)
Referring Provider: Kerri Perches, MD Primary Care Physician:  Syliva Overman, MD Primary GI: Dr. Jena Gauss   Chief Complaint  Patient presents with  . Follow-up    HPI:   [name] presents today in follow-up with a history of chronic abdominal pain (CTA negative for mesenteric ischemia), constipation, gastroparesis, and recently heme positive stool with slight drift in ferritin recently and chronic anemia. Early TCS/EGD/ED performed. Overall, findings unimpressive with 56-F dilation empirically performed.   Denies melena or hematochezia. Diarrhea one day, constipation next day. Back and forth. Feels more constipated than anything. Lots of gas and bloating. Dysphagia resolved. Weight up to 149 from 136 in June. States vomiting "every day". Can eat "mostly anything just don't mess with fried food". Wakes up nauseated sometimes. For breakfast had bacon and egg sandwich. No vomiting after breakfast. Multiple medications, and patient is unsure what she is actually taking.   Past Medical History  Diagnosis Date  . Bipolar disorder   . CVA (cerebral infarction)   . Pancreatitis 2006    due to Depakote with normal EUS   . Osteoporosis   . Chronic back pain   . Diabetes mellitus   . Trigger finger   . Anxiety disorder   . Hypertension   . Migraines   . Diverticulosis     TCS 9/08 by Dr. Lina Sar for diarrhea . Bx for micro scopic colitis negative.   . Schatzki's ring     non critical / EGD with ED 8/2011with RMR  . S/P colonoscopy 1610,9604, 2011    left-sided diverticula, hx of simple adenomas . 2011, random bx negative for microscopic colitis  . Glaucoma   . Allergic rhinitis   . Hypothyroidism     thyroid goiter  . Anemia   . Blood transfusion   . GERD (gastroesophageal reflux disease)   . Stroke     left sided weakness  . Seizures     unknown etiology-on meds-last seizure was 3 yerars ago  . Anxiety   . Depression   . Metabolic encephalopathy 08/03/2011  . Sleep  apnea   . Arthritis   . Gum symptoms     infection on antibiotic    Past Surgical History  Procedure Laterality Date  . Abdominal hysterectomy    . Cholecystectomy    . Ovarian cyst removal    . Carpal tunnel release  07/22/04    left/ Dr. Romeo Apple   . Breast reduction surgery    . Bilateral cataract surgery    . Biopsy of thyroid gland  2009  . Surgical excision of 3 tumors from right thigh and right buttock  and left upper thigh  2010  . Back surgery  July 2012  . Spine surgery  09/29/2010    dr brooks  . Maloney dilation  12/29/2010    RMR;  . Esophagogastroduodenoscopy  12/29/2010    Rourk-Retained food in the esophagus and stomach, small hiatal hernia, status post Maloney dilation of the esophagus  . Craniotomy  11/23/2011    Procedure: CRANIOTOMY TUMOR EXCISION;  Surgeon: Hewitt Shorts, MD;  Location: [facility];  Service: Neurosurgery;  Laterality: N/A;  Craniotomy for tumor resection  . Brain surgery  11/2011    resection of meningioma  . Colonoscopy N/A 09/25/2012    [id number] diverticulosis.  colonic polyp-removed : tubular adenoma  . Esophagogastroduodenoscopy N/A 09/25/2012    NWG:[id number] atonic baggy esophagus status post Maloney dilation 56 F. Hiatal hernia    Current Outpatient  Prescriptions  Medication Sig Dispense Refill  . clobetasol (TEMOVATE) 0.05 % external solution Apply 1 application topically 2 (two) times daily.       . cloNIDine (CATAPRES) 0.3 MG tablet Take 1 tablet (0.3 mg total) by mouth 2 (two) times daily.  60 tablet  11  . clopidogrel (PLAVIX) 75 MG tablet TAKE ONE TABLET BY MOUTH ONCE DAILY.  30 tablet  3  . diclofenac sodium (VOLTAREN) 1 % GEL Apply 2 g topically daily as needed (Pain).      . fluticasone (FLONASE) 50 MCG/ACT nasal spray Place 1 spray into the nose daily. Allergies  16 g  3  . hydrochlorothiazide (HYDRODIURIL) 12.5 MG tablet Take 12.5 mg by mouth daily.      [name] lamoTRIgine (LAMICTAL) 100 MG tablet Take 100 mg by mouth  daily.       [name] levothyroxine (LEVOXYL) 50 MCG tablet Take 50 mcg by mouth every morning.       . Linaclotide (LINZESS) 290 MCG CAPS Take 1 capsule by mouth daily.  30 capsule  3  . LORazepam (ATIVAN) 1 MG tablet Take 1 mg by mouth 4 (four) times daily.       . meclizine (ANTIVERT) 12.5 MG tablet Take 12.5 mg by mouth 3 (three) times daily as needed.      . methocarbamol (ROBAXIN) 500 MG tablet 500 mg daily.       . metoprolol (LOPRESSOR) 50 MG tablet Take 1 tablet (50 mg total) by mouth 2 (two) times daily.  60 tablet  4  . mirtazapine (REMERON) 30 MG tablet Take 30 mg by mouth at bedtime.       . montelukast (SINGULAIR) 10 MG tablet Take 10 mg by mouth daily.      [name] morphine (MSIR) 15 MG tablet Take 15 mg by mouth 2 (two) times daily as needed. For pain      . naproxen (NAPROSYN) 500 MG tablet Take 1 tablet (500 mg total) by mouth 2 (two) times daily with a meal.  30 tablet  0  . nystatin (MYCOSTATIN) powder Apply topically 2 (two) times daily.      . ondansetron (ZOFRAN) 4 MG tablet Take 4 mg by mouth every 8 (eight) hours as needed for nausea.      . pregabalin (LYRICA) 75 MG capsule Take 75 mg by mouth daily.      . RABEprazole (ACIPHEX) 20 MG tablet Take 1 tablet (20 mg total) by mouth 2 (two) times daily.  60 tablet  11  . rosuvastatin (CRESTOR) 5 MG tablet Take 1 tablet (5 mg total) by mouth at bedtime.  30 tablet  11  . saxagliptin HCl (ONGLYZA) 5 MG TABS tablet One tablet once daily at breakfast  30 tablet  3  . sertraline (ZOLOFT) 100 MG tablet Take 150 mg by mouth at bedtime.       [name] thioridazine (MELLARIL) 10 MG tablet Take 10 mg by mouth 2 (two) times daily.       [name] thiothixene (NAVANE) 2 MG capsule Take 2 mg by mouth at bedtime.      . traZODone (DESYREL) 50 MG tablet as needed.       . Vitamin D, Ergocalciferol, (DRISDOL) 50000 UNITS CAPS TAKE 1 CAPSULE BY MOUTH ONCE WEEKLY.  4 capsule  2  . hydroxychloroquine (PLAQUENIL) 200 MG tablet Take 200 mg by mouth daily.      .  hydrOXYzine (ATARAX/VISTARIL) 10 MG tablet Take 10 mg by mouth 2 (two)  times daily.      [name] losartan (COZAAR) 25 MG tablet Take 1 tablet (25 mg total) by mouth daily.  30 tablet  3   No current facility-administered medications for this visit.    Allergies as of 12/11/2012 - Review Complete 12/11/2012  Allergen Reaction Noted  . Cephalexin Hives   . Iron Nausea And Vomiting 11/15/2012  . Milk-related compounds Other (See Comments) 07/29/2008  . Penicillins Hives   . Phenazopyridine hcl Other (See Comments) 06/19/2007    Family History  Problem Relation Age of Onset  . Heart attack Mother   . Pneumonia Father   . Kidney failure Father   . Cancer Sister     pancreatic  . Diabetes Brother   . Hypertension Brother   . Colon cancer Neg Hx   . Anesthesia problems Neg Hx   . Hypotension Neg Hx   . Malignant hyperthermia Neg Hx   . Pseudochol deficiency Neg Hx     History   Social History  . Marital Status: Divorced    Spouse Name: N/A    Number of Children: 1  . Years of Education: N/A   Occupational History  . disabled     Social History Main Topics  . Smoking status: Former Smoker -- 0.25 packs/day for 7 years    Types: Cigarettes    Quit date: 09/22/2012  . Smokeless tobacco: Never Used     Comment: started at 24 smoked 6 yrs and stopped. restarted 2 months ago  . Alcohol Use: No     Comment: 5 cigs/day   . Drug Use: No  . Sexual Activity: No   Other Topics Concern  . None   Social History Narrative  . None    Review of Systems: Negative unless mentioned in HPI.   Physical Exam: BP 123/64  Pulse 71  Temp(Src) 97.6 F (36.4 C) (Oral)  Ht 5\' 4"  (1.626 m)  Wt 149 lb (67.586 kg)  BMI 25.56 kg/m2 General:  Drowsy but easily awakens.  Head:  Normocephalic and atraumatic. Eyes:  Conjuctiva clear without scleral icterus. Heart:  S1, S2 present without murmurs, rubs, or gallops. Regular rate and rhythm. Abdomen:  +BS, soft, non-tender and non-distended. No  rebound or guarding.  Msk:  Symmetrical without gross deformities. Normal posture. Extremities:  Without edema. Neurologic:  Alert and  oriented x4;  grossly normal neurologically. Skin:  Intact without significant lesions or rashes. Psych:  Alert and cooperative. Normal mood and affect.  Lab Results  Component Value Date   WBC 5.7 10/25/2012   HGB 9.8* 10/25/2012   HCT 33.0* 10/25/2012   MCV 72.4* 10/25/2012   PLT 180 10/25/2012   Lab Results  Component Value Date   IRON 66 07/04/2012   TIBC 356 11/24/2010   FERRITIN 77 07/04/2012

## 2012-12-11 NOTE — Assessment & Plan Note (Signed)
Persistent anemia with a slight drift recently and overall benign TCS/EGD. Heme positive stool. Capsule study indicated to complete GI work-up. Likely anemia of chronic disease a large contributor. Recheck CBC and ferritin today. Will likely need EGD with capsule placement due to gastroparesis.

## 2012-12-12 NOTE — Progress Notes (Signed)
CC'd to PCP 

## 2012-12-13 ENCOUNTER — Telehealth: Payer: Self-pay | Admitting: Family Medicine

## 2012-12-13 DIAGNOSIS — M7550 Bursitis of unspecified shoulder: Secondary | ICD-10-CM | POA: Insufficient documentation

## 2012-12-13 DIAGNOSIS — M5020 Other cervical disc displacement, unspecified cervical region: Secondary | ICD-10-CM | POA: Insufficient documentation

## 2012-12-13 NOTE — Telephone Encounter (Signed)
Discussed with pt, her concern is about her niece feeling she is over medicated, should not have proposed neck surgery by Dr Jule Ser, [name] states cannot live with this pain. Also said something about Dr Jule Ser wanting her off her BOP meds?? I advised i will wait to hear from him, but doubt that he said that and she needs to stay on her BP medications

## 2012-12-13 NOTE — Telephone Encounter (Signed)
Need proof of this , alsoI believe that a confernce /Ov with Pattricia Boss and Merdis Delay needs to be arranged in the office NOT before next Wednesday please.  That is , if Ifeoma is agreeable, because at thsi time, they are not seeig eye to eye, but really iof this lady has health care POA of [place] we need to strongly recommend the conference

## 2012-12-13 NOTE — Telephone Encounter (Signed)
Will hold on releasing information until proof is provided.

## 2012-12-16 ENCOUNTER — Ambulatory Visit (HOSPITAL_COMMUNITY)
Admission: RE | Admit: 2012-12-16 | Discharge: 2012-12-16 | Disposition: A | Discharge status: Home / Self Care | Payer: Medicare Other | Source: Ambulatory Visit | Attending: Family Medicine | Admitting: Family Medicine

## 2012-12-16 DIAGNOSIS — I129 Hypertensive chronic kidney disease with stage 1 through stage 4 chronic kidney disease, or unspecified chronic kidney disease: Secondary | ICD-10-CM

## 2012-12-16 DIAGNOSIS — R911 Solitary pulmonary nodule: Secondary | ICD-10-CM

## 2012-12-16 DIAGNOSIS — M545 Low back pain: Secondary | ICD-10-CM

## 2012-12-16 DIAGNOSIS — R918 Other nonspecific abnormal finding of lung field: Secondary | ICD-10-CM | POA: Insufficient documentation

## 2012-12-16 DIAGNOSIS — E119 Type 2 diabetes mellitus without complications: Secondary | ICD-10-CM

## 2012-12-16 DIAGNOSIS — N189 Chronic kidney disease, unspecified: Secondary | ICD-10-CM

## 2012-12-16 DIAGNOSIS — Z09 Encounter for follow-up examination after completed treatment for conditions other than malignant neoplasm: Secondary | ICD-10-CM | POA: Insufficient documentation

## 2012-12-16 LAB — CBC WITH DIFFERENTIAL/PLATELET
Basophils Absolute: 0 10*3/uL (ref 0.0–0.1)
Eosinophils Relative: 4 % (ref 0–5)
HCT: 34.1 % — ABNORMAL LOW (ref 36.0–46.0)
Hemoglobin: 10.4 g/dL — ABNORMAL LOW (ref 12.0–15.0)
Lymphocytes Relative: 37 % (ref 12–46)
MCV: 73.2 fL — ABNORMAL LOW (ref 78.0–100.0)
Monocytes Absolute: 0.4 10*3/uL (ref 0.1–1.0)
Monocytes Relative: 8 % (ref 3–12)
RDW: 14.8 % (ref 11.5–15.5)
WBC: 4.6 10*3/uL (ref 4.0–10.5)

## 2012-12-24 ENCOUNTER — Other Ambulatory Visit (HOSPITAL_COMMUNITY): Payer: Self-pay | Admitting: Neurosurgery

## 2012-12-24 DIAGNOSIS — M67919 Unspecified disorder of synovium and tendon, unspecified shoulder: Secondary | ICD-10-CM

## 2012-12-25 ENCOUNTER — Other Ambulatory Visit: Payer: Self-pay | Admitting: *Deleted

## 2012-12-25 NOTE — Telephone Encounter (Signed)
Patient is aware and is not interested at this time.

## 2012-12-27 ENCOUNTER — Ambulatory Visit (HOSPITAL_COMMUNITY)
Admission: RE | Admit: 2012-12-27 | Discharge: 2012-12-27 | Disposition: A | Discharge status: Home / Self Care | Payer: Medicare Other | Source: Ambulatory Visit | Attending: Neurosurgery | Admitting: Neurosurgery

## 2012-12-27 DIAGNOSIS — M249 Joint derangement, unspecified: Secondary | ICD-10-CM | POA: Insufficient documentation

## 2012-12-27 DIAGNOSIS — M25519 Pain in unspecified shoulder: Secondary | ICD-10-CM | POA: Insufficient documentation

## 2012-12-27 DIAGNOSIS — M67919 Unspecified disorder of synovium and tendon, unspecified shoulder: Secondary | ICD-10-CM

## 2012-12-27 DIAGNOSIS — IMO0002 Reserved for concepts with insufficient information to code with codable children: Secondary | ICD-10-CM | POA: Insufficient documentation

## 2012-12-27 DIAGNOSIS — M75100 Unspecified rotator cuff tear or rupture of unspecified shoulder, not specified as traumatic: Secondary | ICD-10-CM | POA: Insufficient documentation

## 2012-12-30 ENCOUNTER — Ambulatory Visit: Payer: Medicare Other | Admitting: Family Medicine

## 2013-01-01 NOTE — Progress Notes (Signed)
Quick Note:  Hgb with some improvement.  Ferritin continues to drift.  Heme positive on file.  I am discussing with Dr. Jena Gauss proceeding with capsule placement via EGD. This would wrap up investigation of anemia. ______

## 2013-01-07 ENCOUNTER — Other Ambulatory Visit: Payer: Self-pay | Admitting: Internal Medicine

## 2013-01-07 ENCOUNTER — Telehealth: Payer: Self-pay | Admitting: Orthopedic Surgery

## 2013-01-07 ENCOUNTER — Other Ambulatory Visit: Payer: Self-pay | Admitting: Gastroenterology

## 2013-01-07 DIAGNOSIS — D649 Anemia, unspecified: Secondary | ICD-10-CM

## 2013-01-07 NOTE — Progress Notes (Signed)
Patient is scheduled for a GIVENS on Wed 11/5 at 7:30 she is aware and I have mailed her instructions

## 2013-01-07 NOTE — Telephone Encounter (Signed)
Noted  

## 2013-01-07 NOTE — Telephone Encounter (Signed)
Patient was contacted directly by [facility], following referral; scheduled for appoint ment w/Dr. Reita Cliche 01/03/13. Pt seen as scheduled, and called to relay that Dr. Reita Cliche did another MRI on her shoulder, and "that there is a tear."  -  No reports have been received as of yet.  Faxed note, requesting report for Dr Romeo Apple to review and advise.  Patient's ph# is [phone].

## 2013-01-07 NOTE — Progress Notes (Signed)
Discussed with Dr. Jena Gauss.  Recommend single dose of Reglan 10 mg by mouth 30 minutes prior to capsule study. We will attempt to avoid EGD for capsule placement.   Please schedule for capsule study.  She needs to have Reglan 10 mg by mouth 30 minutes prior to swallowing the capsule in short stay. This can be given by one of the nursing staff at time of the procedure. I'm not quite sure how to order this.

## 2013-01-08 ENCOUNTER — Telehealth: Payer: Self-pay

## 2013-01-08 ENCOUNTER — Encounter (HOSPITAL_COMMUNITY): Payer: Self-pay | Admitting: Pharmacy Technician

## 2013-01-08 NOTE — Telephone Encounter (Signed)
Nikki aware that patient has appt Nov 3 and will go get labs done before her appt

## 2013-01-08 NOTE — Telephone Encounter (Signed)
Called patient and left message for them to return call at the office   

## 2013-01-09 ENCOUNTER — Other Ambulatory Visit: Payer: Self-pay | Admitting: Family Medicine

## 2013-01-09 LAB — LIPID PANEL
HDL: 67 mg/dL (ref >39)
LDL Cholesterol: 101 mg/dL — ABNORMAL HIGH (ref 0–99)
Triglycerides: 181 mg/dL — ABNORMAL HIGH (ref <150)
VLDL: 36 mg/dL (ref 0–40)

## 2013-01-09 LAB — COMPLETE METABOLIC PANEL WITH GFR
ALT: 21 U/L (ref 0–35)
AST: 18 U/L (ref 0–37)
Creat: 0.7 mg/dL (ref 0.50–1.10)
Total Bilirubin: 0.3 mg/dL (ref 0.3–1.2)

## 2013-01-13 ENCOUNTER — Ambulatory Visit (INDEPENDENT_AMBULATORY_CARE_PROVIDER_SITE_OTHER): Payer: Medicare Other | Admitting: Family Medicine

## 2013-01-13 ENCOUNTER — Encounter: Payer: Self-pay | Admitting: Family Medicine

## 2013-01-13 VITALS — BP 110/66 | HR 73 | Resp 18 | Ht 62.0 in | Wt 152.1 lb

## 2013-01-13 DIAGNOSIS — F319 Bipolar disorder, unspecified: Secondary | ICD-10-CM

## 2013-01-13 DIAGNOSIS — E1140 Type 2 diabetes mellitus with diabetic neuropathy, unspecified: Secondary | ICD-10-CM

## 2013-01-13 DIAGNOSIS — R51 Headache: Secondary | ICD-10-CM

## 2013-01-13 DIAGNOSIS — E1142 Type 2 diabetes mellitus with diabetic polyneuropathy: Secondary | ICD-10-CM

## 2013-01-13 DIAGNOSIS — Z79899 Other long term (current) drug therapy: Secondary | ICD-10-CM

## 2013-01-13 DIAGNOSIS — E1143 Type 2 diabetes mellitus with diabetic autonomic (poly)neuropathy: Secondary | ICD-10-CM

## 2013-01-13 DIAGNOSIS — G40909 Epilepsy, unspecified, not intractable, without status epilepticus: Secondary | ICD-10-CM

## 2013-01-13 DIAGNOSIS — G4700 Insomnia, unspecified: Secondary | ICD-10-CM

## 2013-01-13 DIAGNOSIS — K3184 Gastroparesis: Secondary | ICD-10-CM

## 2013-01-13 DIAGNOSIS — Z7189 Other specified counseling: Secondary | ICD-10-CM

## 2013-01-13 DIAGNOSIS — E1149 Type 2 diabetes mellitus with other diabetic neurological complication: Secondary | ICD-10-CM

## 2013-01-13 DIAGNOSIS — M5010 Cervical disc disorder with radiculopathy, unspecified cervical region: Secondary | ICD-10-CM

## 2013-01-13 DIAGNOSIS — I10 Essential (primary) hypertension: Secondary | ICD-10-CM

## 2013-01-13 DIAGNOSIS — M5412 Radiculopathy, cervical region: Secondary | ICD-10-CM

## 2013-01-13 NOTE — Patient Instructions (Addendum)
F/U in early January, call if you need me before  STOP hCTZ, blood pressure is over corrected  Cholesterol is slightly higher than it should be, need to change from macaroni and cheese, to fresh vegetable  Blood sugar is adequately controlled  You need to establish with a neurologist , for seizures and also h/o stroke as far as plavix is concerned

## 2013-01-13 NOTE — Progress Notes (Signed)
  Subjective:    Patient ID: Ernestina Penna, female    DOB: Sep 16, 1950, 62 y.o.   MRN: [id number]  HPI Pt in for f/u chronic medical conditions. Her niece, Merdis Delay is present. She was not happy when her call in recently to discuss Nykiah's health and medcation was stalled, needing documentation on record, which she now produces , naming her as 1 of 2 POA's for pt , including her health care. At the time she had called in, Pt had specifically in asking to speak to me, during that conversation she specifically told  Me not to discuss her health with Merdis Delay, and when I brought this up at the meeting ,Ms Manzano said this was true. Recent labs are also reviewed. Of note T[place]ra needs a neurologist, she is also in the process of being evaluated for c spine surgery    Review of Systems See HPI Denies recent fever or chills. Denies sinus pressure, nasal congestion, ear pain or sore throat. Denies chest congestion, productive cough or wheezing. Denies chest pains, palpitations and leg swelling Denies abdominal pain, nausea, vomiting,diarrhea or constipation.   Denies dysuria, frequency, hesitancy or incontinence. C/o chronic neck and back pain  Denies headaches, seizures, numbness, or tingling. Denies uncontrolled axiety or insomnia. Denies skin break down or rash.        Objective:   Physical Exam Patient alert and oriented and in no cardiopulmonary distress.  HEENT: No facial asymmetry, EOMI, no sinus tenderness,  oropharynx pink and moist.  Neck decreased though adequate ROM, no JVD, no adenopathy  Chest: Clear to auscultation bilaterally  CVS: S1, S2 no murmurs, no S3.  ABD: Soft non tender.   Ext: No edema  MS: Adequate though reduced ROM lumbar spine.  Skin: Intact, no ulcerations or rash noted.  Psych: Good eye contact, normal affect. Memory ildly impaired, not anxious or depressed appearing  CNS: CN 2-12 intact, power, normal throughout.        Assessment & Plan:

## 2013-01-13 NOTE — Assessment & Plan Note (Signed)
Over corrected stop HCTZ

## 2013-01-14 ENCOUNTER — Other Ambulatory Visit: Payer: Self-pay

## 2013-01-14 MED ORDER — METOCLOPRAMIDE HCL 10 MG PO TABS
10.0000 mg | ORAL_TABLET | Freq: Once | ORAL | Status: DC
  Filled 2013-01-14: qty 1

## 2013-01-14 MED ORDER — NYSTATIN 100000 UNIT/GM EX POWD: CUTANEOUS | Status: DC

## 2013-01-15 ENCOUNTER — Encounter (HOSPITAL_COMMUNITY): Payer: Self-pay

## 2013-01-15 ENCOUNTER — Encounter (HOSPITAL_COMMUNITY)
Admission: RE | Disposition: A | Discharge status: Home / Self Care | Payer: Self-pay | Source: Ambulatory Visit | Attending: Internal Medicine

## 2013-01-15 ENCOUNTER — Ambulatory Visit (HOSPITAL_COMMUNITY)
Admission: RE | Admit: 2013-01-15 | Discharge: 2013-01-15 | Disposition: A | Discharge status: Home / Self Care | Payer: Medicare Other | Source: Ambulatory Visit | Attending: Internal Medicine | Admitting: Internal Medicine

## 2013-01-15 DIAGNOSIS — D649 Anemia, unspecified: Secondary | ICD-10-CM

## 2013-01-15 DIAGNOSIS — D509 Iron deficiency anemia, unspecified: Secondary | ICD-10-CM | POA: Insufficient documentation

## 2013-01-15 HISTORY — PX: GIVENS CAPSULE STUDY: SHX5432

## 2013-01-15 SURGERY — IMAGING PROCEDURE, GI TRACT, INTRALUMINAL, VIA CAPSULE

## 2013-01-15 MED ORDER — METOCLOPRAMIDE HCL 10 MG PO TABS
10.0000 mg | ORAL_TABLET | Freq: Once | ORAL | Status: AC
  Administered 2013-01-15: 10 mg via ORAL
  Filled 2013-01-15: qty 1

## 2013-01-19 ENCOUNTER — Other Ambulatory Visit: Payer: Self-pay | Admitting: Family Medicine

## 2013-01-19 DIAGNOSIS — Z7189 Other specified counseling: Secondary | ICD-10-CM | POA: Insufficient documentation

## 2013-01-19 DIAGNOSIS — Z8673 Personal history of transient ischemic attack (TIA), and cerebral infarction without residual deficits: Secondary | ICD-10-CM

## 2013-01-19 DIAGNOSIS — R569 Unspecified convulsions: Secondary | ICD-10-CM | POA: Insufficient documentation

## 2013-01-19 DIAGNOSIS — G40909 Epilepsy, unspecified, not intractable, without status epilepticus: Secondary | ICD-10-CM | POA: Insufficient documentation

## 2013-01-19 NOTE — Assessment & Plan Note (Signed)
Controlled, no change in medication  

## 2013-01-19 NOTE — Assessment & Plan Note (Signed)
Controlled, no change in medication Patient advised to reduce carb and sweets, commit to regular physical activity, take meds as prescribed, test blood as directed, and attempt to lose weight, to improve blood sugar control.  

## 2013-01-19 NOTE — Assessment & Plan Note (Signed)
Despite c/o insomnia,  Pt is on multiple medications with potentially sedating side effects, and is more often than not noted to be "drowsy" with decreased level of conciousnes at medical  visits

## 2013-01-19 NOTE — Assessment & Plan Note (Signed)
Documented seizure disorder , needs to establish with neurology to follow this. Pt and niece are aware

## 2013-01-19 NOTE — Assessment & Plan Note (Signed)
As documented, well needed, and past due visit with Pt's niece, Merdis Delay, who has paperwork naming both herself and Inara's son , Coralie Carpen as having power of attorney over [place], inlcuding health matters. Specific concern was re blood pressure medication and overall long medication list. After review I d/c HCTZ , and pt needs to establish with neurologist for ongoing care of headache, seizure d/oand to determine the need for ongoing  plavix, no documentation available documenting CVA, I di not believe she needs to continue plavix

## 2013-01-19 NOTE — Assessment & Plan Note (Signed)
Medcations reviewed at visit with one of her HCPOA present, niece [place]. I was able to discontinue one BPmed, HCTZ, otjher medications are as listed

## 2013-01-19 NOTE — Assessment & Plan Note (Signed)
Denies homicidal or suicidal ideation, denies hallucination. Followed by psychiatry

## 2013-01-19 NOTE — Assessment & Plan Note (Signed)
New recent discussion about possible c spine surgery, will follow up on this, uncertain as to whether this will significantly

## 2013-01-19 NOTE — Assessment & Plan Note (Signed)
Recurrent headache history, recently evaiuated by [facility] neurology, who have advised that she return to Dr Gerilyn Pilgrim , local neurologist, who pt sttaes still that she "will not see"

## 2013-01-20 ENCOUNTER — Other Ambulatory Visit: Payer: Self-pay | Admitting: Urgent Care

## 2013-01-21 ENCOUNTER — Encounter (HOSPITAL_COMMUNITY): Payer: Self-pay | Admitting: Internal Medicine

## 2013-01-22 ENCOUNTER — Encounter: Payer: Self-pay | Admitting: Gastroenterology

## 2013-01-22 ENCOUNTER — Other Ambulatory Visit: Payer: Self-pay

## 2013-01-22 ENCOUNTER — Other Ambulatory Visit: Payer: Self-pay | Admitting: Family Medicine

## 2013-01-22 ENCOUNTER — Ambulatory Visit (INDEPENDENT_AMBULATORY_CARE_PROVIDER_SITE_OTHER): Payer: Medicare Other | Admitting: Gastroenterology

## 2013-01-22 VITALS — BP 111/68 | HR 73 | Temp 97.9°F | Wt 155.0 lb

## 2013-01-22 DIAGNOSIS — R634 Abnormal weight loss: Secondary | ICD-10-CM

## 2013-01-22 DIAGNOSIS — D509 Iron deficiency anemia, unspecified: Secondary | ICD-10-CM

## 2013-01-22 DIAGNOSIS — D649 Anemia, unspecified: Secondary | ICD-10-CM

## 2013-01-22 DIAGNOSIS — R195 Other fecal abnormalities: Secondary | ICD-10-CM

## 2013-01-22 DIAGNOSIS — K5900 Constipation, unspecified: Secondary | ICD-10-CM

## 2013-01-22 MED ORDER — DIPHENOXYLATE-ATROPINE 2.5-0.025 MG PO TABS: 1.0000 | ORAL_TABLET | Freq: Two times a day (BID) | ORAL | Status: DC | PRN

## 2013-01-22 MED ORDER — METOPROLOL TARTRATE 50 MG PO TABS: 50.0000 mg | ORAL_TABLET | Freq: Two times a day (BID) | ORAL | Status: DC

## 2013-01-22 NOTE — Progress Notes (Signed)
Referring Provider: Kerri Perches, MD Primary Care Physician:  Syliva Overman, MD Primary GI: Dr. Jena Gauss   Chief Complaint  Patient presents with  . Follow-up  . Abdominal Pain    HPI:   [name] follow-up with a history of chronic abdominal pain (CTA negative for mesenteric ischemia), constipation, gastroparesis, and recently heme positive stool with slight drift in ferritin recently and chronic anemia. Early TCS/EGD/ED performed with overall unimpressive findings. 56-F empiric dilation. Capsule study undergone to complete GI work-up for anemia. Capsule study normal.  Feels tired and drained today. Stays nauseated. Phenergan has helped with nausea. "just have to take it". Abdominal pain multiple sites of abdomen. BM 2-3 times per day. Sometimes starts off as a regular BM then will have soft/loose stool. Taking a multivitamin every day; states has iron in it. Weight up a few pounds since last visit.   Past Medical History  Diagnosis Date  . Bipolar disorder   . CVA (cerebral infarction)   . Pancreatitis 2006    due to Depakote with normal EUS   . Osteoporosis   . Chronic back pain   . Diabetes mellitus   . Trigger finger   . Anxiety disorder   . Hypertension   . Migraines   . Diverticulosis     TCS 9/08 by Dr. Lina Sar for diarrhea . Bx for micro scopic colitis negative.   . Schatzki's ring     non critical / EGD with ED 8/2011with RMR  . S/P colonoscopy 4098,1191, 2011    left-sided diverticula, hx of simple adenomas . 2011, random bx negative for microscopic colitis  . Glaucoma   . Allergic rhinitis   . Hypothyroidism     thyroid goiter  . Anemia   . Blood transfusion   . GERD (gastroesophageal reflux disease)   . Stroke     left sided weakness  . Seizures     unknown etiology-on meds-last seizure was 3 yerars ago  . Anxiety   . Depression   . Metabolic encephalopathy 08/03/2011  . Sleep apnea   . Arthritis   . Gum symptoms     infection on  antibiotic    Past Surgical History  Procedure Laterality Date  . Abdominal hysterectomy    . Cholecystectomy    . Ovarian cyst removal    . Carpal tunnel release  07/22/04    left/ Dr. Romeo Apple   . Breast reduction surgery    . Bilateral cataract surgery    . Biopsy of thyroid gland  2009  . Surgical excision of 3 tumors from right thigh and right buttock  and left upper thigh  2010  . Back surgery  July 2012  . Spine surgery  09/29/2010    dr brooks  . Maloney dilation  12/29/2010    RMR;  . Esophagogastroduodenoscopy  12/29/2010    Rourk-Retained food in the esophagus and stomach, small hiatal hernia, status post Maloney dilation of the esophagus  . Craniotomy  11/23/2011    Procedure: CRANIOTOMY TUMOR EXCISION;  Surgeon: Hewitt Shorts, MD;  Location: [facility];  Service: Neurosurgery;  Laterality: N/A;  Craniotomy for tumor resection  . Brain surgery  11/2011    resection of meningioma  . Colonoscopy N/A 09/25/2012    YNW:[id number] diverticulosis.  colonic polyp-removed : tubular adenoma  . Esophagogastroduodenoscopy N/A 09/25/2012    YQM:VHQIONGE atonic baggy esophagus status post Maloney dilation 56 F. Hiatal hernia  . Givens capsule study N/A 01/15/2013  NORMAL.     Current Outpatient Prescriptions  Medication Sig Dispense Refill  . clobetasol (TEMOVATE) 0.05 % external solution Apply 1 application topically 2 (two) times daily.       . cloNIDine (CATAPRES) 0.3 MG tablet Take 1 tablet (0.3 mg total) by mouth 2 (two) times daily.  60 tablet  11  . clopidogrel (PLAVIX) 75 MG tablet TAKE ONE TABLET BY MOUTH ONCE DAILY.  30 tablet  3  . diclofenac sodium (VOLTAREN) 1 % GEL Apply 2 g topically daily as needed (Pain).      . fluticasone (FLONASE) 50 MCG/ACT nasal spray Place 1 spray into the nose daily. Allergies  16 g  3  . hydroxychloroquine (PLAQUENIL) 200 MG tablet Take 200 mg by mouth daily.      . hydrOXYzine (ATARAX/VISTARIL) 10 MG tablet Take 10 mg by mouth 2 (two)  times daily.      [name] lamoTRIgine (LAMICTAL) 100 MG tablet Take 100 mg by mouth daily.       [name] levothyroxine (LEVOXYL) 50 MCG tablet Take 50 mcg by mouth every morning.       . Linaclotide (LINZESS) 290 MCG CAPS Take 1 capsule by mouth daily.  30 capsule  3  . LORazepam (ATIVAN) 1 MG tablet Take 1 mg by mouth 4 (four) times daily.       [name] losartan (COZAAR) 25 MG tablet Take 1 tablet (25 mg total) by mouth daily.  30 tablet  3  . meclizine (ANTIVERT) 12.5 MG tablet Take 12.5 mg by mouth 3 (three) times daily as needed.      . methocarbamol (ROBAXIN) 500 MG tablet 500 mg daily.       . metoprolol (LOPRESSOR) 50 MG tablet Take 1 tablet (50 mg total) by mouth 2 (two) times daily.  60 tablet  4  . mirtazapine (REMERON) 30 MG tablet Take 30 mg by mouth at bedtime.       . montelukast (SINGULAIR) 10 MG tablet Take 10 mg by mouth daily.      [name] morphine (MSIR) 15 MG tablet Take 15 mg by mouth 2 (two) times daily as needed. For pain      . naproxen (NAPROSYN) 500 MG tablet Take 1 tablet (500 mg total) by mouth 2 (two) times daily with a meal.  30 tablet  0  . nystatin (MYCOSTATIN) powder APPLY TO AFFECTED AREAS TWICE DAILY.  60 g  2  . ondansetron (ZOFRAN) 4 MG tablet Take 4 mg by mouth every 8 (eight) hours as needed for nausea.      . pregabalin (LYRICA) 75 MG capsule Take 75 mg by mouth daily.      . RABEprazole (ACIPHEX) 20 MG tablet Take 1 tablet (20 mg total) by mouth 2 (two) times daily.  60 tablet  11  . rosuvastatin (CRESTOR) 5 MG tablet Take 1 tablet (5 mg total) by mouth at bedtime.  30 tablet  11  . saxagliptin HCl (ONGLYZA) 5 MG TABS tablet One tablet once daily at breakfast  30 tablet  3  . sertraline (ZOLOFT) 100 MG tablet Take 150 mg by mouth at bedtime.       [name] thioridazine (MELLARIL) 10 MG tablet Take 10 mg by mouth 2 (two) times daily.       [name] thiothixene (NAVANE) 2 MG capsule Take 2 mg by mouth at bedtime.      . traZODone (DESYREL) 50 MG tablet as needed.       . Vitamin D,  Ergocalciferol, (DRISDOL) 50000 UNITS CAPS TAKE 1 CAPSULE BY MOUTH ONCE WEEKLY.  4 capsule  2   No current facility-administered medications for this visit.    Allergies as of 01/22/2013 - Review Complete 01/22/2013  Allergen Reaction Noted  . Cephalexin Hives   . Iron Nausea And Vomiting 11/15/2012  . Milk-related compounds Other (See Comments) 07/29/2008  . Penicillins Hives   . Phenazopyridine hcl Other (See Comments) 06/19/2007    Family History  Problem Relation Age of Onset  . Heart attack Mother   . Pneumonia Father   . Kidney failure Father   . Cancer Sister     pancreatic  . Diabetes Brother   . Hypertension Brother   . Colon cancer Neg Hx   . Anesthesia problems Neg Hx   . Hypotension Neg Hx   . Malignant hyperthermia Neg Hx   . Pseudochol deficiency Neg Hx     History   Social History  . Marital Status: Divorced    Spouse Name: N/A    Number of Children: 1  . Years of Education: N/A   Occupational History  . disabled     Social History Main Topics  . Smoking status: Former Smoker -- 0.25 packs/day for 7 years    Types: Cigarettes    Quit date: 09/22/2012  . Smokeless tobacco: Never Used     Comment: started at 24 smoked 6 yrs and stopped. restarted 2 months ago  . Alcohol Use: No     Comment: 5 cigs/day   . Drug Use: No  . Sexual Activity: No   Other Topics Concern  . None   Social History Narrative  . None    Review of Systems: As mentioned in HPI.   Physical Exam: BP 111/68  Pulse 73  Temp(Src) 97.9 F (36.6 C) (Oral)  Wt 155 lb (70.308 kg) General:   Drowsy, flat affect. Baseline for patient.   Head:  Normocephalic and atraumatic. Eyes:  Conjuctiva clear without scleral icterus. Mouth:  Oral mucosa pink and moist. Good dentition. No lesions. Abdomen:  +BS, soft, non-tender and non-distended. No rebound or guarding. No HSM or masses noted. Msk:  Symmetrical without gross deformities. Normal posture. Extremities:  Without  edema. Psych:  Cooperative, pleasant, flat affect.   Lab Results  Component Value Date   WBC 4.6 12/16/2012   HGB 10.4* 12/16/2012   HCT 34.1* 12/16/2012   MCV 73.2* 12/16/2012   PLT 188 12/16/2012   Lab Results  Component Value Date   IRON 66 07/04/2012   TIBC 356 11/24/2010   FERRITIN 69 12/16/2012   Lab Results  Component Value Date   ALT 21 01/09/2013   AST 18 01/09/2013   ALKPHOS 116 01/09/2013   BILITOT 0.3 01/09/2013

## 2013-01-22 NOTE — Assessment & Plan Note (Signed)
Likely chronic disease. TCS/EGD and capsule study all completed. Monitor for any worsening or dropping in ferritin/iron. Consider hematology referral if ferritin continues to drift. 3 month return.

## 2013-01-22 NOTE — Assessment & Plan Note (Signed)
Stable/improved. 3 month f/u.

## 2013-01-22 NOTE — Op Note (Signed)
Small Bowel Givens Capsule Study Procedure date:  01/15/13  Referring Provider:  Gerrit Halls, NP, Roetta Sessions, MD  PCP:  Dr. Syliva Overman, MD  Indication for procedure:  Chronic microcytic anemia, heme positive stool. EGD and colonoscopy in July 2014 showed atonic baggy esophagus status post dilation, hiatal hernia, diverticulosis, tubular adenoma removed from the colon. Patient is on Plavix and Naprosyn chronically.  Patient data:  Wt: 152 pounds Ht: 62 inches Waist: 42 inches  Findings:  Givens capsule remained in the esophagus for the first 10 minutes of the study. Study was complete as the capsule did reach the colon. Normal-appearing small bowel mucosa.  First Gastric image:  10 minutes 1 second First Duodenal image: 56 minutes 25 seconds First Ileo-Cecal Valve image: 3 hours 24 minutes 25 seconds First Cecal image: 3 hours 38 minutes 24 seconds Gastric Passage time: 46 min Small Bowel Passage time:  2 hr 41 min  Summary & Recommendations: Normal-appearing small bowel mucosa. No etiology to explain Hemoccult-positive stool or chronic microcytic anemia on this study. Patient has a followup appointment today in the office to further address.

## 2013-01-22 NOTE — Patient Instructions (Signed)
Continue to take Linzess 1 capsule each morning on an empty stomach. You may take this every other day if needed.   I sent Lomotil to your pharmacy to have on hand if needed. Use this sparingly.   We will see you in 3 months!

## 2013-01-22 NOTE — Assessment & Plan Note (Signed)
Continue Linzess 290 mcg daily. May use every other day if necessary. No concerning symptoms. Abdominal pain at baseline; chronic in etiology. CTA negative for ischemia. Return in 3 months.

## 2013-01-22 NOTE — Telephone Encounter (Signed)
Rx was sent to pharmacy electronically. 

## 2013-01-23 NOTE — Progress Notes (Signed)
cc'd to pcp 

## 2013-01-27 ENCOUNTER — Other Ambulatory Visit: Payer: Self-pay | Admitting: Family Medicine

## 2013-01-27 ENCOUNTER — Telehealth: Payer: Self-pay

## 2013-01-27 MED ORDER — GLIPIZIDE ER 2.5 MG PO TB24: 2.5000 mg | ORAL_TABLET | Freq: Every day | ORAL | Status: DC

## 2013-01-27 NOTE — Telephone Encounter (Signed)
She is to start glipizide 2.5 mg one daily with a meal in addition to the onglyza. The glipizide is sent in

## 2013-01-27 NOTE — Telephone Encounter (Signed)
Needs to check and see if pt has recently had any epidural injection or is on steroids to raise blood suagr. Check if symptoms suggestive of uTI or respiratory infection, send UA if UTI symptom [name] needs to drin k water. Nurse to re test blood sugar and contact office with the value she gets , ifit is that high, pt will need to be started on glipizide 5 mg daily, and atke one asap, continue the onglyza. She just had a good HBA1C so I believe this is likekely due to underlying trigger pls get back to me with info from nurse asap

## 2013-01-27 NOTE — Telephone Encounter (Signed)
Spoke with patient who states that she is in more pain than normal today.  She has no URI symptoms or UTI symptoms.  She has not received steroids this month.  She has not eaten and is only drinking water.  Blood sugar rechecked while this nurse was on the phone with a reading of 264.  Her aide Damian Leavell is present with her.  Please advise.

## 2013-01-27 NOTE — Telephone Encounter (Signed)
Patient aware and med sent in  

## 2013-01-27 NOTE — Telephone Encounter (Signed)
[name] got a call from Brylinn's aide stating her blood sugar was 428 fasting. She hasn't ate anything with excess carbs/sweets and doesn't know what to do. I called the patient and she has took all her 8:00 meds (onglyza) Please advise  [name] would like a call back [phone]

## 2013-01-29 ENCOUNTER — Telehealth: Payer: Self-pay | Admitting: Orthopedic Surgery

## 2013-01-29 NOTE — Telephone Encounter (Signed)
Patient called, states she is still having some pain in the left shoulder and downward into left arm - made reference to the MRI done on 12/24/12 of shoulder; states has seen Dr. Reita Cliche as Dr. Romeo Apple advised.  Asking about the report from Dr. Reita Cliche - I confirmed we have received it, and Dr. Romeo Apple had reviewed it, although it does not yet appear in scanned media documents as of yet.  Please call patient, ph# [phone] (Home).

## 2013-01-29 NOTE — Telephone Encounter (Signed)
Spoke with patient and reviewed Dr. Earl Gala note that was still on site at our office, and scheduled patient an appointment to come in 02/18/13 for evaluation left shoulder.

## 2013-01-30 ENCOUNTER — Other Ambulatory Visit: Payer: Self-pay | Admitting: Family Medicine

## 2013-02-04 ENCOUNTER — Encounter (HOSPITAL_COMMUNITY): Payer: Self-pay | Admitting: Emergency Medicine

## 2013-02-04 ENCOUNTER — Emergency Department (HOSPITAL_COMMUNITY)
Admission: EM | Admit: 2013-02-04 | Discharge: 2013-02-04 | Disposition: A | Discharge status: Home / Self Care | Payer: Medicare Other | Attending: Emergency Medicine | Admitting: Emergency Medicine

## 2013-02-04 DIAGNOSIS — M7552 Bursitis of left shoulder: Secondary | ICD-10-CM

## 2013-02-04 DIAGNOSIS — M67919 Unspecified disorder of synovium and tendon, unspecified shoulder: Secondary | ICD-10-CM | POA: Insufficient documentation

## 2013-02-04 DIAGNOSIS — Z8709 Personal history of other diseases of the respiratory system: Secondary | ICD-10-CM | POA: Insufficient documentation

## 2013-02-04 DIAGNOSIS — Z880 Allergy status to penicillin: Secondary | ICD-10-CM | POA: Insufficient documentation

## 2013-02-04 DIAGNOSIS — Z8719 Personal history of other diseases of the digestive system: Secondary | ICD-10-CM | POA: Insufficient documentation

## 2013-02-04 DIAGNOSIS — Z79899 Other long term (current) drug therapy: Secondary | ICD-10-CM | POA: Insufficient documentation

## 2013-02-04 DIAGNOSIS — M129 Arthropathy, unspecified: Secondary | ICD-10-CM | POA: Insufficient documentation

## 2013-02-04 DIAGNOSIS — E039 Hypothyroidism, unspecified: Secondary | ICD-10-CM | POA: Insufficient documentation

## 2013-02-04 DIAGNOSIS — E119 Type 2 diabetes mellitus without complications: Secondary | ICD-10-CM | POA: Insufficient documentation

## 2013-02-04 DIAGNOSIS — I10 Essential (primary) hypertension: Secondary | ICD-10-CM | POA: Insufficient documentation

## 2013-02-04 DIAGNOSIS — G40909 Epilepsy, unspecified, not intractable, without status epilepticus: Secondary | ICD-10-CM | POA: Insufficient documentation

## 2013-02-04 DIAGNOSIS — G8929 Other chronic pain: Secondary | ICD-10-CM | POA: Insufficient documentation

## 2013-02-04 DIAGNOSIS — H409 Unspecified glaucoma: Secondary | ICD-10-CM | POA: Insufficient documentation

## 2013-02-04 DIAGNOSIS — IMO0002 Reserved for concepts with insufficient information to code with codable children: Secondary | ICD-10-CM | POA: Insufficient documentation

## 2013-02-04 DIAGNOSIS — Z7902 Long term (current) use of antithrombotics/antiplatelets: Secondary | ICD-10-CM | POA: Insufficient documentation

## 2013-02-04 DIAGNOSIS — M719 Bursopathy, unspecified: Secondary | ICD-10-CM | POA: Insufficient documentation

## 2013-02-04 DIAGNOSIS — Z87891 Personal history of nicotine dependence: Secondary | ICD-10-CM | POA: Insufficient documentation

## 2013-02-04 DIAGNOSIS — M810 Age-related osteoporosis without current pathological fracture: Secondary | ICD-10-CM | POA: Insufficient documentation

## 2013-02-04 DIAGNOSIS — F411 Generalized anxiety disorder: Secondary | ICD-10-CM | POA: Insufficient documentation

## 2013-02-04 DIAGNOSIS — Z862 Personal history of diseases of the blood and blood-forming organs and certain disorders involving the immune mechanism: Secondary | ICD-10-CM | POA: Insufficient documentation

## 2013-02-04 DIAGNOSIS — Z8673 Personal history of transient ischemic attack (TIA), and cerebral infarction without residual deficits: Secondary | ICD-10-CM | POA: Insufficient documentation

## 2013-02-04 DIAGNOSIS — F319 Bipolar disorder, unspecified: Secondary | ICD-10-CM | POA: Insufficient documentation

## 2013-02-04 DIAGNOSIS — G43909 Migraine, unspecified, not intractable, without status migrainosus: Secondary | ICD-10-CM | POA: Insufficient documentation

## 2013-02-04 MED ORDER — DEXAMETHASONE SODIUM PHOSPHATE 4 MG/ML IJ SOLN
8.0000 mg | Freq: Once | INTRAMUSCULAR | Status: AC
  Administered 2013-02-04: 8 mg via INTRAMUSCULAR
  Filled 2013-02-04: qty 2

## 2013-02-04 MED ORDER — FENTANYL CITRATE 0.05 MG/ML IJ SOLN
100.0000 ug | Freq: Once | INTRAMUSCULAR | Status: AC
  Administered 2013-02-04: 100 ug via INTRAMUSCULAR
  Filled 2013-02-04: qty 2

## 2013-02-04 MED ORDER — ONDANSETRON HCL 4 MG PO TABS
4.0000 mg | ORAL_TABLET | Freq: Once | ORAL | Status: AC
  Administered 2013-02-04: 4 mg via ORAL
  Filled 2013-02-04: qty 1

## 2013-02-04 NOTE — ED Notes (Signed)
Pt c/o pain in left shoulder since July of this year.  Pt says pain has been getting worse.  Pt saw Dr. Romeo Apple and was sent to Dr. Bettina Gavia in [place].  Dr. Bettina Gavia sent pt back to Dr. Romeo Apple but Dr. Romeo Apple can't see her until Dec 9.

## 2013-02-04 NOTE — ED Provider Notes (Signed)
CSN: [id number]     Arrival date & time 02/04/13  1640 History   First MD Initiated Contact with Patient 02/04/13 1733     Chief Complaint  Patient presents with  . Shoulder Pain   (Consider location/radiation/quality/duration/timing/severity/associated sxs/prior Treatment) HPI Comments: Patient is a 62 year old female who presents to the emergency department with ongoing shoulder pain. The patient states this problem has been going on since June or July of 2014. She has recently had an MRI of the shoulder which revealed tendinitis and bursitis. The patient is currently on OxyContin for pain. The patient states his last approximately 1-2 hours and then she is in severe pain again. She is being seen by Dr. Romeo Apple and the neurosurgery team in [place]. She states she attempted to make Dr. Romeo Apple aware of her continued problems with pain, but his office stated that they could not give her an appointment for December 9. The patient presents at this time for assistance with pain.  Patient is a 62 y.o. female presenting with shoulder pain. The history is provided by the patient.  Shoulder Pain This is a chronic problem. Associated symptoms include arthralgias and weakness. Pertinent negatives include no abdominal pain, chest pain, coughing or neck pain.    Past Medical History  Diagnosis Date  . Bipolar disorder   . CVA (cerebral infarction)   . Pancreatitis 2006    due to Depakote with normal EUS   . Osteoporosis   . Chronic back pain   . Diabetes mellitus   . Trigger finger   . Anxiety disorder   . Hypertension   . Migraines   . Diverticulosis     TCS 9/08 by Dr. Lina Sar for diarrhea . Bx for micro scopic colitis negative.   . Schatzki's ring     non critical / EGD with ED 8/2011with RMR  . S/P colonoscopy 2536,6440, 2011    left-sided diverticula, hx of simple adenomas . 2011, random bx negative for microscopic colitis  . Glaucoma   . Allergic rhinitis   . Hypothyroidism      thyroid goiter  . Anemia   . Blood transfusion   . GERD (gastroesophageal reflux disease)   . Stroke     left sided weakness  . Seizures     unknown etiology-on meds-last seizure was 3 yerars ago  . Anxiety   . Depression   . Metabolic encephalopathy 08/03/2011  . Sleep apnea   . Arthritis   . Gum symptoms     infection on antibiotic   Past Surgical History  Procedure Laterality Date  . Abdominal hysterectomy    . Cholecystectomy    . Ovarian cyst removal    . Carpal tunnel release  07/22/04    left/ Dr. Romeo Apple   . Breast reduction surgery    . Bilateral cataract surgery    . Biopsy of thyroid gland  2009  . Surgical excision of 3 tumors from right thigh and right buttock  and left upper thigh  2010  . Back surgery  July 2012  . Spine surgery  09/29/2010    dr brooks  . Maloney dilation  12/29/2010    RMR;  . Esophagogastroduodenoscopy  12/29/2010    Rourk-Retained food in the esophagus and stomach, small hiatal hernia, status post Maloney dilation of the esophagus  . Craniotomy  11/23/2011    Procedure: CRANIOTOMY TUMOR EXCISION;  Surgeon: Hewitt Shorts, MD;  Location: [facility];  Service: Neurosurgery;  Laterality: N/A;  Craniotomy  for tumor resection  . Brain surgery  11/2011    resection of meningioma  . Colonoscopy N/A 09/25/2012    [id number] diverticulosis.  colonic polyp-removed : tubular adenoma  . Esophagogastroduodenoscopy N/A 09/25/2012    [id number] atonic baggy esophagus status post Maloney dilation 56 F. Hiatal hernia  . Givens capsule study N/A 01/15/2013    NORMAL.    Family History  Problem Relation Age of Onset  . Heart attack Mother   . Pneumonia Father   . Kidney failure Father   . Cancer Sister     pancreatic  . Diabetes Brother   . Hypertension Brother   . Colon cancer Neg Hx   . Anesthesia problems Neg Hx   . Hypotension Neg Hx   . Malignant hyperthermia Neg Hx   . Pseudochol deficiency Neg Hx    History  Substance Use  Topics  . Smoking status: Former Smoker -- 0.25 packs/day for 7 years    Types: Cigarettes    Quit date: 09/22/2012  . Smokeless tobacco: Never Used     Comment: started at 24 smoked 6 yrs and stopped. restarted 2 months ago  . Alcohol Use: No     Comment: 5 cigs/day    OB History   Grav Para Term Preterm Abortions TAB SAB Ect Mult Living   6 1 1  5  5         Review of Systems  Constitutional: Negative for activity change.       All ROS Neg except as noted in HPI  HENT: Negative for nosebleeds.   Eyes: Negative for photophobia and discharge.  Respiratory: Negative for cough, shortness of breath and wheezing.   Cardiovascular: Negative for chest pain and palpitations.  Gastrointestinal: Negative for abdominal pain and blood in stool.  Genitourinary: Negative for dysuria, frequency and hematuria.  Musculoskeletal: Positive for arthralgias and back pain. Negative for neck pain.  Skin: Negative.   Neurological: Positive for weakness. Negative for dizziness, seizures and speech difficulty.  Psychiatric/Behavioral: Negative for hallucinations and confusion. The patient is nervous/anxious.     Allergies  Cephalexin; Iron; Milk-related compounds; Penicillins; and Phenazopyridine hcl  Home Medications   Current Outpatient Rx  Name  Route  Sig  Dispense  Refill  . montelukast (SINGULAIR) 10 MG tablet   Oral   Take 10 mg by mouth daily.         [name] nystatin (MYCOSTATIN) powder   Topical   Apply topically 4 (four) times daily.         . Vitamin D, Ergocalciferol, (DRISDOL) 50000 UNITS CAPS capsule   Oral   Take 50,000 Units by mouth every 7 (seven) days.         . clobetasol (TEMOVATE) 0.05 % external solution   Topical   Apply 1 application topically 2 (two) times daily.          . cloNIDine (CATAPRES) 0.3 MG tablet   Oral   Take 1 tablet (0.3 mg total) by mouth 2 (two) times daily.   60 tablet   11     Decreased dose clonidine effective 06/14/2012 Clo ...   .  clopidogrel (PLAVIX) 75 MG tablet      TAKE ONE TABLET BY MOUTH ONCE DAILY.   30 tablet   3     Dispense as written.   . diclofenac sodium (VOLTAREN) 1 % GEL   Topical   Apply 2 g topically daily as needed (Pain).         [name]  diphenoxylate-atropine (LOMOTIL) 2.5-0.025 MG per tablet   Oral   Take 1 tablet by mouth 2 (two) times daily as needed for diarrhea or loose stools.   30 tablet   0   . fluticasone (FLONASE) 50 MCG/ACT nasal spray   Nasal   Place 1 spray into the nose daily. Allergies   16 g   3   . glipiZIDE (GLUCOTROL XL) 2.5 MG 24 hr tablet   Oral   Take 1 tablet (2.5 mg total) by mouth daily with breakfast.   30 tablet   3   . hydrochlorothiazide (MICROZIDE) 12.5 MG capsule   Oral   Take 12.5 mg by mouth daily.         . hydroxychloroquine (PLAQUENIL) 200 MG tablet   Oral   Take 200 mg by mouth daily.         . hydrOXYzine (ATARAX/VISTARIL) 10 MG tablet   Oral   Take 10 mg by mouth 2 (two) times daily.         [name] lamoTRIgine (LAMICTAL) 100 MG tablet   Oral   Take 100 mg by mouth daily.          [name] levothyroxine (LEVOXYL) 50 MCG tablet   Oral   Take 50 mcg by mouth every morning.          [name] Linaclotide (LINZESS) 290 MCG CAPS   Oral   Take 1 capsule by mouth daily.   30 capsule   3   . LORazepam (ATIVAN) 1 MG tablet   Oral   Take 1 mg by mouth 4 (four) times daily.          [name] losartan (COZAAR) 25 MG tablet   Oral   Take 1 tablet (25 mg total) by mouth daily.   30 tablet   3   . meclizine (ANTIVERT) 12.5 MG tablet   Oral   Take 12.5 mg by mouth 3 (three) times daily as needed.         . methocarbamol (ROBAXIN) 500 MG tablet      500 mg daily.          . metoprolol (LOPRESSOR) 50 MG tablet   Oral   Take 1 tablet (50 mg total) by mouth 2 (two) times daily.   60 tablet   5   . mirtazapine (REMERON) 30 MG tablet   Oral   Take 30 mg by mouth at bedtime.          [name] morphine (MSIR) 15 MG tablet   Oral   Take 15 mg by  mouth 2 (two) times daily as needed. For pain         . naproxen (NAPROSYN) 500 MG tablet   Oral   Take 1 tablet (500 mg total) by mouth 2 (two) times daily with a meal.   30 tablet   0   . ofloxacin (OCUFLOX) 0.3 % ophthalmic solution   Both Eyes   Place 1 drop into both eyes daily.         . ondansetron (ZOFRAN) 4 MG tablet   Oral   Take 4 mg by mouth every 8 (eight) hours as needed for nausea.         [name] oxyCODONE-acetaminophen (PERCOCET) 10-325 MG per tablet   Oral   Take 1 tablet by mouth 3 (three) times daily.         . pregabalin (LYRICA) 75 MG capsule   Oral   Take 75 mg by mouth daily.         [name]  promethazine (PHENERGAN) 12.5 MG tablet   Oral   Take 12.5 mg by mouth every 6 (six) hours as needed. FOR NAUSEA AND VOMITING         . RABEprazole (ACIPHEX) 20 MG tablet   Oral   Take 1 tablet (20 mg total) by mouth 2 (two) times daily.   60 tablet   11   . RESTASIS 0.05 % ophthalmic emulsion   Both Eyes   Place 1 drop into both eyes 2 (two) times daily.         . rosuvastatin (CRESTOR) 5 MG tablet   Oral   Take 1 tablet (5 mg total) by mouth at bedtime.   30 tablet   11     Dose reduction effective 06/14/2012   . saxagliptin HCl (ONGLYZA) 5 MG TABS tablet      One tablet once daily at breakfast   30 tablet   3     Dose increase effective 11/28/2012   . sertraline (ZOLOFT) 100 MG tablet   Oral   Take 150 mg by mouth at bedtime.          [name] thioridazine (MELLARIL) 10 MG tablet   Oral   Take 10 mg by mouth 2 (two) times daily.          [name] thiothixene (NAVANE) 2 MG capsule   Oral   Take 2 mg by mouth at bedtime.         . traZODone (DESYREL) 50 MG tablet      as needed.           BP 137/52  Pulse 88  Temp(Src) 99 F (37.2 C) (Oral)  Resp 20  Ht 5\' 2"  (1.575 m)  Wt 150 lb (68.04 kg)  BMI 27.43 kg/m2  SpO2 100% Physical Exam  Nursing note and vitals reviewed. Constitutional: She is oriented to person, place, and time.  She appears well-developed and well-nourished.  Non-toxic appearance.  HENT:  Head: Normocephalic.  Right Ear: Tympanic membrane and external ear normal.  Left Ear: Tympanic membrane and external ear normal.  Eyes: EOM and lids are normal. Pupils are equal, round, and reactive to light.  Neck: Normal range of motion. Neck supple. Carotid bruit is not present.  Cardiovascular: Normal rate, regular rhythm, normal heart sounds, intact distal pulses and normal pulses.   Pulmonary/Chest: Breath sounds normal. No respiratory distress.  Abdominal: Soft. Bowel sounds are normal. There is no tenderness. There is no guarding.  Musculoskeletal: Normal range of motion.  There is pain to palpation in any attempted range of motion of the left shoulder. There is no evidence of dislocation. There no hot joints appreciated. The brachial and radial pulses are 2+. The capillary refill is less than 3 seconds.  Lymphadenopathy:       Head (right side): No submandibular adenopathy present.       Head (left side): No submandibular adenopathy present.    She has no cervical adenopathy.  Neurological: She is alert and oriented to person, place, and time. She has normal strength. No cranial nerve deficit or sensory deficit.  The grip and strength of the left upper and lower extremity are slightly weaker than the right. It is of note that the patient has had" stroke" with left-sided weakness.  Skin: Skin is warm and dry.  Psychiatric: She has a normal mood and affect. Her speech is normal.    ED Course  Procedures (including critical care time) Labs Review Labs Reviewed - No data  to display Imaging Review No results found.  EKG Interpretation   None       MDM   1. Bursitis/tendonitis, shoulder, left    *I have reviewed nursing notes, vital signs, and all appropriate lab and imaging results for this patient.**  I have reviewed the MRI of the left shoulder from October 14. This is consistent with both  bursitis, tendinitis, and some mild to moderate muscle tear.  Examination does not support a new cerebrovascular accident. There no hot joints or any findings consistent with aseptic shoulder. There no bruits involving the subclavian area, and there is no compromise of the brachial radial pulses consistent with vascular compromise.  Suspect patient is having an acute exacerbation of inflammation involving the left shoulder especially since she has documented evidence of bursitis and tendinitis.  Plan at this time is for the patient to have a shoulder sling. An injection of fentanyl and Decadron were given to the patient. Patient is advised to see Dr. Lodema Hong tomorrow morning for additional assistance with her pain management.  Kathie Dike, PA-C 02/04/13 [phone]

## 2013-02-04 NOTE — ED Provider Notes (Signed)
Medical screening examination/treatment/procedure(s) were performed by non-physician practitioner and as supervising physician I was immediately available for consultation/collaboration.  EKG Interpretation   None        Mandell Pangborn, MD 02/04/13 2049 

## 2013-02-05 ENCOUNTER — Other Ambulatory Visit: Payer: Self-pay | Admitting: Family Medicine

## 2013-02-10 ENCOUNTER — Telehealth: Payer: Self-pay | Admitting: Family Medicine

## 2013-02-10 DIAGNOSIS — I129 Hypertensive chronic kidney disease with stage 1 through stage 4 chronic kidney disease, or unspecified chronic kidney disease: Secondary | ICD-10-CM

## 2013-02-10 DIAGNOSIS — E119 Type 2 diabetes mellitus without complications: Secondary | ICD-10-CM

## 2013-02-10 DIAGNOSIS — N189 Chronic kidney disease, unspecified: Secondary | ICD-10-CM

## 2013-02-10 DIAGNOSIS — M545 Low back pain: Secondary | ICD-10-CM

## 2013-02-10 NOTE — Telephone Encounter (Signed)
Pt called yesterday stating BP has been high for past 3 days. Denied chest pain, light headedness, headache, numbness or weakness, c/o left upper extremity pain which she will see ortho for  Please have her worked in as an "add on" for Md visit for 8:30 am , either Tuesday or Wednesday morning, pls call her with the apppt

## 2013-02-12 NOTE — Telephone Encounter (Signed)
Left message for patient to call back  

## 2013-02-13 NOTE — Telephone Encounter (Signed)
Appointment 12.8.2014 at 2:15 patient is aware

## 2013-02-17 ENCOUNTER — Ambulatory Visit (INDEPENDENT_AMBULATORY_CARE_PROVIDER_SITE_OTHER): Payer: Medicare Other | Admitting: Family Medicine

## 2013-02-17 ENCOUNTER — Encounter (INDEPENDENT_AMBULATORY_CARE_PROVIDER_SITE_OTHER): Payer: Self-pay

## 2013-02-17 ENCOUNTER — Encounter: Payer: Self-pay | Admitting: Family Medicine

## 2013-02-17 VITALS — BP 190/90 | HR 72 | Resp 18 | Ht 62.0 in | Wt 160.0 lb

## 2013-02-17 DIAGNOSIS — M5412 Radiculopathy, cervical region: Secondary | ICD-10-CM

## 2013-02-17 DIAGNOSIS — I10 Essential (primary) hypertension: Secondary | ICD-10-CM

## 2013-02-17 DIAGNOSIS — M5010 Cervical disc disorder with radiculopathy, unspecified cervical region: Secondary | ICD-10-CM

## 2013-02-17 DIAGNOSIS — F17200 Nicotine dependence, unspecified, uncomplicated: Secondary | ICD-10-CM

## 2013-02-17 MED ORDER — LOSARTAN POTASSIUM 100 MG PO TABS: 100.0000 mg | ORAL_TABLET | Freq: Every day | ORAL | Status: DC

## 2013-02-17 NOTE — Progress Notes (Signed)
   Subjective:    Patient ID: Ernestina Penna, female    DOB: 1951/03/05, 62 y.o.   MRN: [id number]  HPI  Pt in due to concern of uncontrolled blood pressure, consistently getting elevated readings at home. No new c/o headache, weakness, numbness or geadache  Review of Systems    See HPI Denies recent fever or chills. Denies sinus pressure, nasal congestion, ear pain or sore throat. Denies chest congestion, productive cough or wheezing. Denies chest pains, palpitations and leg swelling Denies abdominal pain, nausea, vomiting,diarrhea or constipation.   Denies dysuria, frequency, hesitancy or incontinence.  Denies headaches, seizures, numbness, or tingling. Increased anxiety and depression due to chronic and uncontrolled neck and upper extremity pain. Not suicidal, homicidal or hallucinating    Objective:   Physical Exam Patient alert and oriented and in no cardiopulmonary distress.  HEENT: No facial asymmetry, EOMI, no sinus tenderness,  oropharynx pink and moist.  Neck  no adenopathy.reduced ROM c spine  Chest: Clear to auscultation bilaterally.Decreased though adequate air entry  CVS: S1, S2 no murmurs, no S3.  ABD: Soft non tender. Bowel sounds normal.  Ext: No edema  MS: Adequate ROM spine,  hips and knees.  Skin: Intact, no ulcerations or rash noted.  Psych: Good eye contact, normal affect. Memory intact not anxious or depressed appearing.  CNS: CN 2-12 intact, power, tone and sensation normal throughout.        Assessment & Plan:

## 2013-02-17 NOTE — Patient Instructions (Addendum)
Blood pressure is very high  New dose of losartan is 100mg  once daily. This has been sent in, and you have been given extra tablets in the office today (losartan 25mg  three tablets)  Change appt from Jan 6 to Dec 2 9 or Dec 30 please  Hope you get help with neck and shoulder pain

## 2013-02-18 ENCOUNTER — Encounter: Payer: Self-pay | Admitting: Orthopedic Surgery

## 2013-02-18 ENCOUNTER — Ambulatory Visit (INDEPENDENT_AMBULATORY_CARE_PROVIDER_SITE_OTHER): Payer: Medicare Other | Admitting: Orthopedic Surgery

## 2013-02-18 VITALS — BP 148/84 | Ht 62.0 in | Wt 160.0 lb

## 2013-02-18 DIAGNOSIS — M75110 Incomplete rotator cuff tear or rupture of unspecified shoulder, not specified as traumatic: Secondary | ICD-10-CM

## 2013-02-18 DIAGNOSIS — M5030 Other cervical disc degeneration, unspecified cervical region: Secondary | ICD-10-CM

## 2013-02-18 NOTE — Progress Notes (Signed)
Patient ID: [name], female   DOB: [birth year], 62 y.o.   MRN: [id number]  Chief Complaint  Patient presents with  . Follow-up    Recheck on left sfoulder. Referred back by Dr. [name]. She had MRI of her shoulder.    HISTORY: The patient was referred to Dr. Ezzard Standing for the following:  C5-6:  Diffuse annular bulge and central disc protrusion with mild mass effect on the ventral thecal sac and focal narrowing of the ventral CSF space.  Mild osteophytic ridging and uncinate spurring with mild foraminal encroachment bilaterally.   C6-7:  Bulging annulus, osteophytic ridging and uncinate spurring but no significant spinal stenosis.  There is mild left greater than right foraminal stenosis.   C7-T1:  Degenerative disc disease with a shallow left paracentral and medial foraminal disc protrusion. There is also moderate facet disease.  He got an MRI which showed the following: Acromioclavicular Joint: The acromion is type 2. There are moderate acromioclavicular degenerative changes. There is a small amount fluid in the subacromial - subdeltoid and the subcoracoid bursa.   Glenohumeral Joint: Small shoulder joint effusion. No significant glenohumeral arthropathy.   Labrum: There is degeneration of the superior labrum without discrete labral tear. There is no paralabral cyst.   Bones:  No significant extra-articular osseous findings.   IMPRESSION: 1. Supraspinatus and infraspinous tendinosis with small full-thickness non insertional tear of the supraspinatus tendon. No focal muscular atrophy identified. 2. Superior labral degeneration with mild tendinosis of the intra-articular portion of the biceps tendon. 3. Mild subacromial -subdeltoid and subcoracoid bursitis.     Electronically Signed   By: Roxy Horseman M.D.   On: 12/27/2012 12:08  At this point the patient's symptoms related to the MRI findings are not matching. Her main complaints to me are numbness and tingling of  the left arm and severe pain which is causing her to cry. As noted on MRI small full-thickness non-insertional tear supraspinatus tendon no atrophy degeneration of the labrum which is age-related and no evidence of surgical lesion  I still think her neck symptoms are causing her the severe pain which would be consistent with neurogenic symptoms. I've referred back to Dr. [name] for further evaluation and treatment. There is also a major concern of manipulation of her cervical spine if indeed she would have to have shoulder surgery. I strongly disagree that she would need surgery for her shoulder and that it is a source of her pain.

## 2013-02-18 NOTE — Patient Instructions (Signed)
Referral back to [facility]

## 2013-02-19 ENCOUNTER — Telehealth: Payer: Self-pay | Admitting: Family Medicine

## 2013-02-19 ENCOUNTER — Telehealth: Payer: Self-pay

## 2013-02-19 NOTE — Telephone Encounter (Signed)
Noted, I believe that she will stop because she really wants to,  this will also help her Bp

## 2013-02-21 ENCOUNTER — Encounter: Payer: Self-pay | Admitting: Neurology

## 2013-02-21 ENCOUNTER — Other Ambulatory Visit: Payer: Self-pay | Admitting: Neurology

## 2013-02-21 ENCOUNTER — Ambulatory Visit (INDEPENDENT_AMBULATORY_CARE_PROVIDER_SITE_OTHER): Payer: Medicare Other | Admitting: Neurology

## 2013-02-21 VITALS — BP 146/70 | HR 72 | Temp 98.3°F | Ht 61.0 in | Wt 156.0 lb

## 2013-02-21 DIAGNOSIS — D329 Benign neoplasm of meninges, unspecified: Secondary | ICD-10-CM

## 2013-02-21 DIAGNOSIS — D320 Benign neoplasm of cerebral meninges: Secondary | ICD-10-CM

## 2013-02-21 DIAGNOSIS — Z8673 Personal history of transient ischemic attack (TIA), and cerebral infarction without residual deficits: Secondary | ICD-10-CM

## 2013-02-21 DIAGNOSIS — R51 Headache: Secondary | ICD-10-CM

## 2013-02-21 DIAGNOSIS — Z79899 Other long term (current) drug therapy: Secondary | ICD-10-CM

## 2013-02-21 DIAGNOSIS — M5412 Radiculopathy, cervical region: Secondary | ICD-10-CM

## 2013-02-21 DIAGNOSIS — G40209 Localization-related (focal) (partial) symptomatic epilepsy and epileptic syndromes with complex partial seizures, not intractable, without status epilepticus: Secondary | ICD-10-CM

## 2013-02-21 DIAGNOSIS — M5010 Cervical disc disorder with radiculopathy, unspecified cervical region: Secondary | ICD-10-CM

## 2013-02-21 NOTE — Progress Notes (Signed)
NEUROLOGY CONSULTATION NOTE  [name] MRN: [id number] DOB: January 19, 1951  Referring provider: Dr. Lodema Hong Primary care provider: Dr. Lodema Hong  Reason for consult:  Headache, history of seizures.  HISTORY OF PRESENT ILLNESS: [name] is a 62 year old right-handed woman with history of hypertension, stroke, type II diabetes, stroke with residual left-sided weakness, seizure disorder, mononeuritis of leg, OSA, thyroid nodule, cervical disc disease, depression, anxiety, chronic pain, chronic back pain with gait instability and falls, polypharmacy, meningioma resection in September 2013 who presents for headache and seizures.  Records and images were personally reviewed where available.    She has been treated by Dr. Gerilyn Pilgrim and [facility] in the past.  HEADACHES: She has longstanding history of chronic headaches.  They are described as from the back of her head and top of the front of her head.  It is a pounding pain, 8/10.  There is some nausea, but no photophobia or phonophobia.  She has blurred vision, but that is constant.  They usually last all day and occur 2 to 3 times a week.  She usually takes Tylenol for it, but she also is taking oxycodone for left arm pain.  SEIZURES: She has history of seizures for many years.  Her last seizure was about 3 years ago.  She is taking Lamictal 100mg  daily.  Her last EEG from 08/08/12 revealed right temporal epileptiform discharges.  She had a known history of meningioma.  She presented to the ED in August 2013 with sudden onset numbness of the feet and left hand weakness and numbness.  MRI of brain noted enlargement of previous planum sphenoidale meningioma compared to prior study in 2009, with vasogenic edema.  In September 2013, she underwent a bicoronal craniotomy and gross total resection.  Repeat MRI of brain with and without contrast performed on 05/01/12 revealed postop resection of meningioma of the planum sphenoidale  with small amount of residual enhancing tissue along the planum sphenoidale (5 x 8mm), postop blood in the bilateral frontal lobes and bilateral encephalomalacia of the gyrus rectus bilaterally.  STROKE: She has a history of stroke going back several years ago.  Semiology is unclear but reports left-sided issues.  She was started on Plavix.  She was never on ASA.  Labs from 01/09/13 revealed LDL of 101 and Hgb A1c of 7.1.  She has history of cervical disc disease.  Recent MRI of cervical spine revealed shallow central disc protrusion at C3-4, degenerative disc disease and facet disease at C5-6 with a central disc protrusion, and shallow left paracentral and medial foraminal disc protrusion at C7-T1.  She is scheduled to see Dr. [name], her neurosurgeon, for this.  She also has history of OSA.  Currently on CPAP and is compliant.  She is on multiple medications for many chronic conditions as listed above, including, Norco, lorazepam, meclizine, mirtazapine, methocarbamol, Lyrica, gabapentin, Zoloft, phenergan, Lamictal, levothyroxine and naproxen.  She has been hospitalized on occasion for altered mental status.  She still has hypersomnolence.  PAST MEDICAL HISTORY: Past Medical History  Diagnosis Date  . Bipolar disorder   . CVA (cerebral infarction)   . Pancreatitis 2006    due to Depakote with normal EUS   . Osteoporosis   . Chronic back pain   . Diabetes mellitus   . Trigger finger   . Anxiety disorder   . Hypertension   . Migraines   . Diverticulosis     TCS 9/08 by Dr. Lina Sar for diarrhea .  Bx for micro scopic colitis negative.   . Schatzki's ring     non critical / EGD with ED 8/2011with RMR  . S/P colonoscopy [id number], 2011    left-sided diverticula, hx of simple adenomas . 2011, random bx negative for microscopic colitis  . Glaucoma   . Allergic rhinitis   . Hypothyroidism     thyroid goiter  . Anemia   . Blood transfusion   . GERD (gastroesophageal reflux  disease)   . Stroke     left sided weakness  . Seizures     unknown etiology-on meds-last seizure was 3 yerars ago  . Anxiety   . Depression   . Metabolic encephalopathy 08/03/2011  . Sleep apnea   . Arthritis   . Gum symptoms     infection on antibiotic    PAST SURGICAL HISTORY: Past Surgical History  Procedure Laterality Date  . Abdominal hysterectomy    . Cholecystectomy    . Ovarian cyst removal    . Carpal tunnel release  07/22/04    left/ Dr. Romeo Apple   . Breast reduction surgery    . Bilateral cataract surgery    . Biopsy of thyroid gland  2009  . Surgical excision of 3 tumors from right thigh and right buttock  and left upper thigh  2010  . Back surgery  July 2012  . Spine surgery  09/29/2010    dr brooks  . Maloney dilation  12/29/2010    RMR;  . Esophagogastroduodenoscopy  12/29/2010    Rourk-Retained food in the esophagus and stomach, small hiatal hernia, status post Maloney dilation of the esophagus  . Craniotomy  11/23/2011    Procedure: CRANIOTOMY TUMOR EXCISION;  Surgeon: Hewitt Shorts, MD;  Location: [facility];  Service: Neurosurgery;  Laterality: N/A;  Craniotomy for tumor resection  . Brain surgery  11/2011    resection of meningioma  . Colonoscopy N/A 09/25/2012    IEP:PIRJJOA diverticulosis.  colonic polyp-removed : tubular adenoma  . Esophagogastroduodenoscopy N/A 09/25/2012    CZY:SAYTKZSW atonic baggy esophagus status post Maloney dilation 56 F. Hiatal hernia  . Givens capsule study N/A 01/15/2013    NORMAL.     MEDICATIONS: Current Outpatient Prescriptions on File Prior to Visit  Medication Sig Dispense Refill  . clobetasol (TEMOVATE) 0.05 % external solution Apply 1 application topically 2 (two) times daily.       . cloNIDine (CATAPRES) 0.3 MG tablet Take 1 tablet (0.3 mg total) by mouth 2 (two) times daily.  60 tablet  11  . clopidogrel (PLAVIX) 75 MG tablet TAKE ONE TABLET BY MOUTH ONCE DAILY.  30 tablet  3  . diclofenac sodium (VOLTAREN)  1 % GEL Apply 2 g topically daily as needed (Pain).      [name] diphenoxylate-atropine (LOMOTIL) 2.5-0.025 MG per tablet Take 1 tablet by mouth 2 (two) times daily as needed for diarrhea or loose stools.  30 tablet  0  . fluticasone (FLONASE) 50 MCG/ACT nasal spray Place 1 spray into the nose daily. Allergies  16 g  3  . glipiZIDE (GLUCOTROL XL) 2.5 MG 24 hr tablet Take 1 tablet (2.5 mg total) by mouth daily with breakfast.  30 tablet  3  . hydrochlorothiazide (MICROZIDE) 12.5 MG capsule TAKE ONE CAPSULE DAILY.  30 capsule  3  . hydroxychloroquine (PLAQUENIL) 200 MG tablet Take 200 mg by mouth daily.      . hydrOXYzine (ATARAX/VISTARIL) 10 MG tablet Take 10 mg by mouth 2 (two) times daily.      [name]  lamoTRIgine (LAMICTAL) 100 MG tablet Take 100 mg by mouth daily.       [name] levothyroxine (LEVOXYL) 50 MCG tablet Take 50 mcg by mouth every morning.       . Linaclotide (LINZESS) 290 MCG CAPS Take 1 capsule by mouth daily.  30 capsule  3  . LORazepam (ATIVAN) 1 MG tablet Take 1 mg by mouth 4 (four) times daily.       [name] losartan (COZAAR) 100 MG tablet Take 1 tablet (100 mg total) by mouth daily.  30 tablet  11  . meclizine (ANTIVERT) 12.5 MG tablet Take 12.5 mg by mouth 3 (three) times daily as needed.      . methocarbamol (ROBAXIN) 500 MG tablet 500 mg daily.       . metoprolol (LOPRESSOR) 50 MG tablet Take 1 tablet (50 mg total) by mouth 2 (two) times daily.  60 tablet  5  . mirtazapine (REMERON) 30 MG tablet Take 30 mg by mouth at bedtime.       . montelukast (SINGULAIR) 10 MG tablet Take 10 mg by mouth daily.      . naproxen (NAPROSYN) 500 MG tablet Take 1 tablet (500 mg total) by mouth 2 (two) times daily with a meal.  30 tablet  0  . nystatin (MYCOSTATIN) powder Apply topically 4 (four) times daily.      [name] ofloxacin (OCUFLOX) 0.3 % ophthalmic solution Place 1 drop into both eyes daily.      . ondansetron (ZOFRAN) 4 MG tablet Take 4 mg by mouth every 8 (eight) hours as needed for nausea.      [name]  oxyCODONE-acetaminophen (PERCOCET) 10-325 MG per tablet Take 1 tablet by mouth 3 (three) times daily.      . pregabalin (LYRICA) 75 MG capsule Take 75 mg by mouth daily.      . promethazine (PHENERGAN) 12.5 MG tablet Take 12.5 mg by mouth every 6 (six) hours as needed. FOR NAUSEA AND VOMITING      . RABEprazole (ACIPHEX) 20 MG tablet Take 1 tablet (20 mg total) by mouth 2 (two) times daily.  60 tablet  11  . RESTASIS 0.05 % ophthalmic emulsion Place 1 drop into both eyes 2 (two) times daily.      . rosuvastatin (CRESTOR) 5 MG tablet Take 1 tablet (5 mg total) by mouth at bedtime.  30 tablet  11  . saxagliptin HCl (ONGLYZA) 5 MG TABS tablet One tablet once daily at breakfast  30 tablet  3  . sertraline (ZOLOFT) 100 MG tablet Take 150 mg by mouth at bedtime.       [name] thioridazine (MELLARIL) 10 MG tablet Take 10 mg by mouth 2 (two) times daily.       [name] thiothixene (NAVANE) 2 MG capsule Take 2 mg by mouth at bedtime.      . traZODone (DESYREL) 50 MG tablet as needed.       . Vitamin D, Ergocalciferol, (DRISDOL) 50000 UNITS CAPS capsule Take 50,000 Units by mouth every 7 (seven) days.      [name] morphine (MSIR) 15 MG tablet Take 15 mg by mouth 2 (two) times daily as needed. For pain       No current facility-administered medications on file prior to visit.    ALLERGIES: Allergies  Allergen Reactions  . Cephalexin Hives  . Iron Nausea And Vomiting  . Milk-Related Compounds Other (See Comments)    Doesn't agree with stomach.   . Penicillins Hives  . Phenazopyridine Hcl  Other (See Comments)    [place]ps      FAMILY HISTORY: Family History  Problem Relation Age of Onset  . Heart attack Mother   . Pneumonia Father   . Kidney failure Father   . Cancer Sister     pancreatic  . Diabetes Brother   . Hypertension Brother   . Colon cancer Neg Hx   . Anesthesia problems Neg Hx   . Hypotension Neg Hx   . Malignant hyperthermia Neg Hx   . Pseudochol deficiency Neg Hx     SOCIAL HISTORY: History     Social History  . Marital Status: Divorced    Spouse Name: N/A    Number of Children: 1  . Years of Education: N/A   Occupational History  . disabled     Social History Main Topics  . Smoking status: Former Smoker -- 0.25 packs/day for 7 years    Types: Cigarettes    Quit date: 09/22/2012  . Smokeless tobacco: Never Used     Comment: started at 24 smoked 6 yrs and stopped. restarted 2 months ago  . Alcohol Use: No     Comment: 5 cigs/day   . Drug Use: No  . Sexual Activity: No   Other Topics Concern  . Not on file   Social History Narrative  . No narrative on file    REVIEW OF SYSTEMS: Constitutional: Fatigue Eyes: Constant blurred vision, not new Ear, nose and throat: No hearing loss, ear pain, nasal congestion, sore throat Cardiovascular: No chest pain, palpitations Respiratory:  Shortness of breath GastrointestinaI: No nausea, vomiting, diarrhea, abdominal pain, fecal incontinence Genitourinary:  No dysuria, urinary retention or frequency Musculoskeletal:  Neck pain, joint pain. Integumentary: No rash, pruritus, skin lesions Neurological: as above Psychiatric: Depression, anxiety Endocrine: No palpitations, fatigue, diaphoresis, mood swings, change in appetite, change in weight, increased thirst Hematologic/Lymphatic:  No anemia, purpura, petechiae. Allergic/Immunologic: no itchy/runny eyes, nasal congestion, recent allergic reactions, rashes  PHYSICAL EXAM: Filed Vitals:   02/21/13 1216  BP: 146/70  Pulse: 72  Temp: 98.3 F (36.8 C)   General: No acute distress Head:  Normocephalic/atraumatic Neck: supple, no paraspinal tenderness, full range of motion Back: No paraspinal tenderness Heart: regular rate and rhythm Lungs: Clear to auscultation bilaterally. Vascular: No carotid bruits. Neurological Exam: Mental status: alert and oriented to person, place, and time, speech slowed but fluent and not dysarthric, language intact. Cranial nerves: CN I: not  tested CN II: pupils equal, round and reactive to light, visual fields intact, fundi not visualized. CN III, IV, VI:  full range of motion, no nystagmus, no ptosis CN V: facial sensation intact CN VII: upper and lower face symmetric CN VIII: hearing intact CN IX, X: gag intact, uvula midline CN XI: sternocleidomastoid and trapezius muscles intact CN XII: tongue midline Bulk & Tone: normal, no fasciculations. Motor: Left shoulder strength limited secondary to pain due to rotator cuff problems.  Otherwise 5/5 throughout. Sensation: endorses reduced pinprick sensation to left arm and leg. Deep Tendon Reflexes: 1+ throughout, except absent at ankles.  Toes down. Finger to nose testing: no dysmetria. Gait: wide-based, slow. Romberg with sway.  IMPRESSION: 1.  Chronic headache, multifactorial. 2.  History of complex partial seizures, controlled. 3.  History of stroke. 4.  Polypharmacy 5.  Chronic pain. 6.  History of meningioma resection.  PLAN: 1.  Given that she is on multiple medications and takes multiple medications almost daily, including opioids, I feel management of headaches would best be  treated by a headache specialist.  We will refer. 2.  Continue Lamictal 100mg  daily. 3.  I think she may discontinue Plavix and start ASA 81mg  daily instead.  She reports never having been on ASA before. 4.  I would recommend optimizing LDL to <70 and optimizing glycemic control 5.  Follow up in 3 months.  At that time, we will likely repeat MRI of brain.  45 minutes spent with patient, over 50% spent counseling and coordinating care.  Thank you for allowing me to take part in the care of this patient.  Shon Millet, DO  CC:  Syliva Overman, MD

## 2013-02-21 NOTE — Patient Instructions (Addendum)
1.  I think you can stop the plavix and just start taking a baby Aspirin (81mg ) daily. 2.  I would continue Lamictal at current dose.  Even though you have not had any recent seizures, your recent EEG this past year shows you are at increased risk for seizures. 3.  For headaches, I think you would best be treated by a headache specialist, given that you are on multiple pain medications. 4.  Follow up with Dr. [name] regarding your neck. 5.  Follow up in 3 months.  I will have to let you know about your referral to [facility]. Their office is located at [address] in [place].  The office number is [phone].

## 2013-02-23 IMAGING — CR DG CHEST 1V PORT
1 series · 1 of 1 positions shown · non-contrast
Comparison: Portable chest x-ray 04/27/2011, 03/30/2010.  Two-view
chest x-ray 08/23/2010.

CLINICAL DATA: Acute mental status changes.  History of diabetes
and hypertension.  Former smoker.

PORTABLE CHEST - 1 VIEW 08/01/2011 [DATE] hours:

[view not recorded]
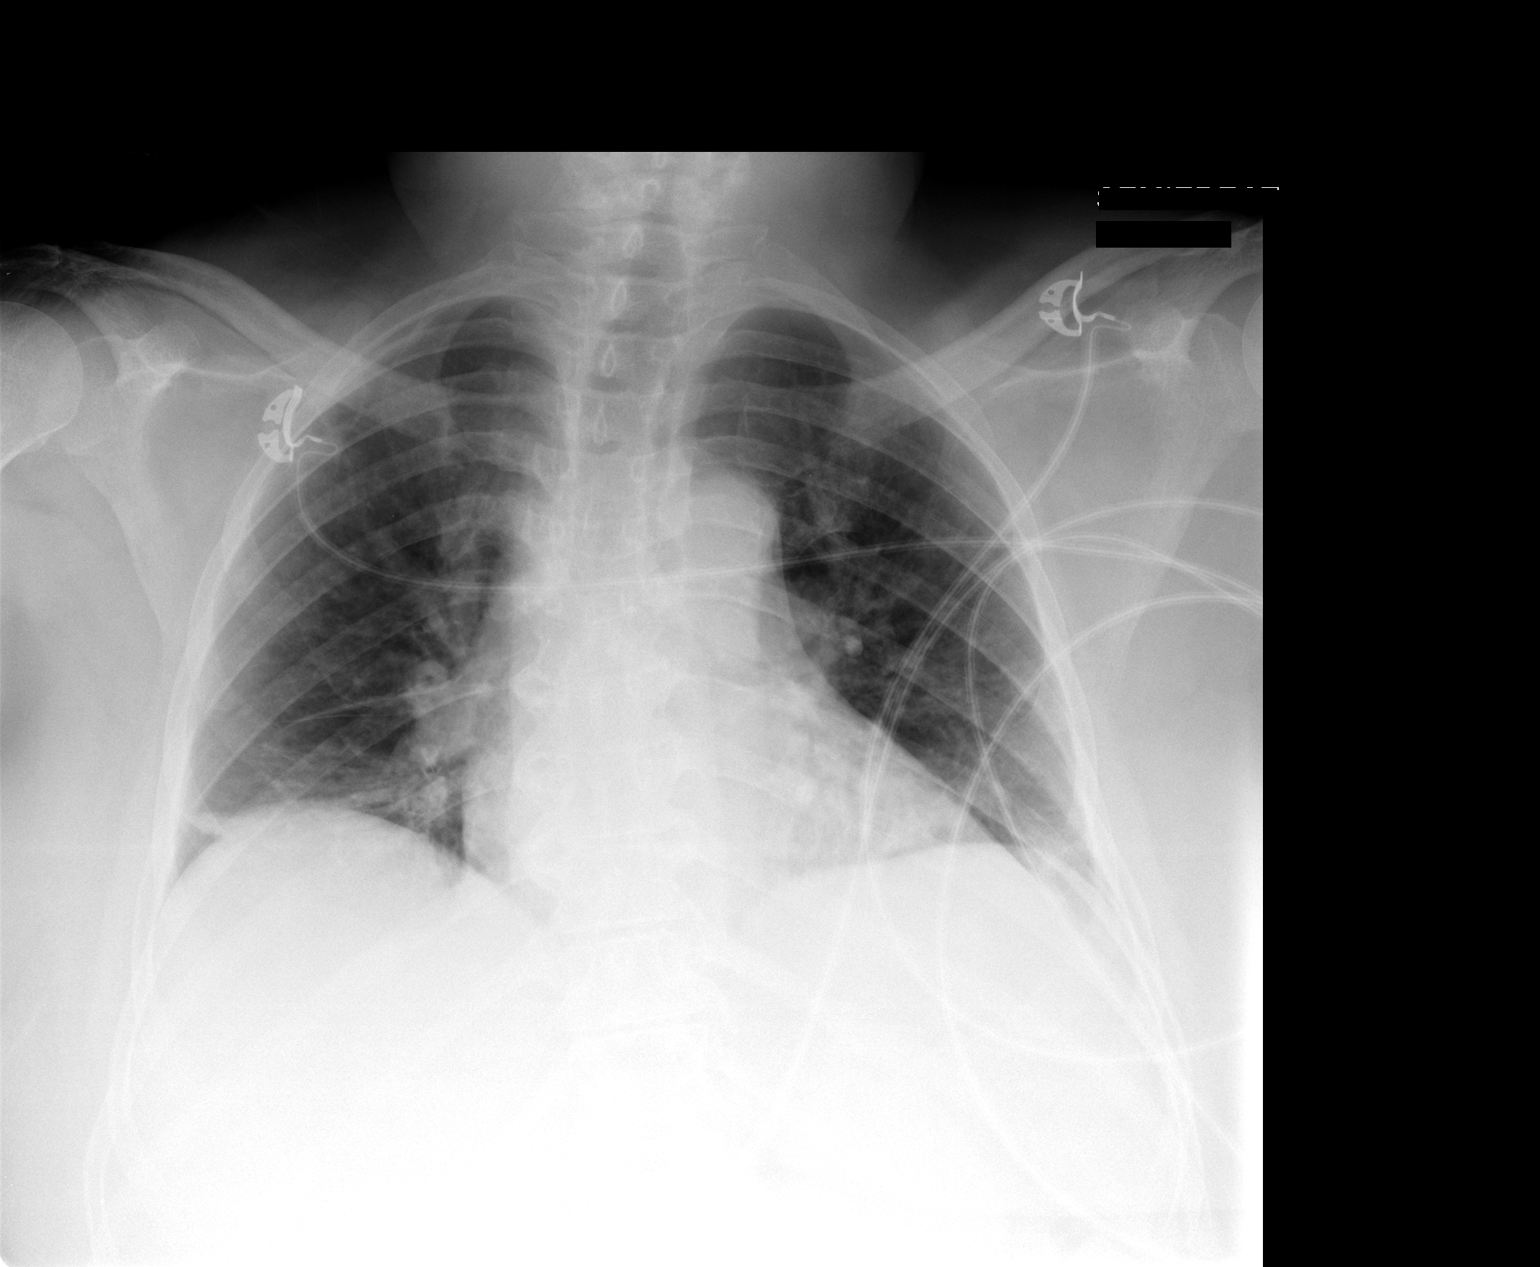

[1 of 1 positions shown; findings below may reference images not displayed]

FINDINGS: Suboptimal inspiration due to body habitus which accounts
for atelectasis at the bases and accentuates the cardiac
silhouette.  Taking this into account, cardiac silhouette upper
normal in size, unchanged.  Lungs otherwise clear.
IMPRESSION: Suboptimal inspiration accounts for bibasilar atelectasis.  No
acute cardiopulmonary disease otherwise.

## 2013-02-24 ENCOUNTER — Telehealth: Payer: Self-pay

## 2013-02-24 IMAGING — CT CT HEAD W/O CM
1 series · 15 of 30 positions shown, 19 images · non-contrast
Comparison: Unenhanced cranial CT 12/12/2010, 05/16/2010,
03/30/2010, 10/31/2009 , 05/29/2009, 08/03/2008, 07/05/2008.

CLINICAL DATA: Acute mental status changes with lethargy.  History
of hypertension, migraines, stroke, and seizures.

CT HEAD WITHOUT CONTRAST
TECHNIQUE: Contiguous axial images were obtained from the base of
the skull through the vertex without contrast.

[Series 2: headseq 4.8 h37s · axial · 0.47mm/px · z∈[+129,+286]mm · 15 of 36 slices shown, 19 images]
[im 2/36  brain]
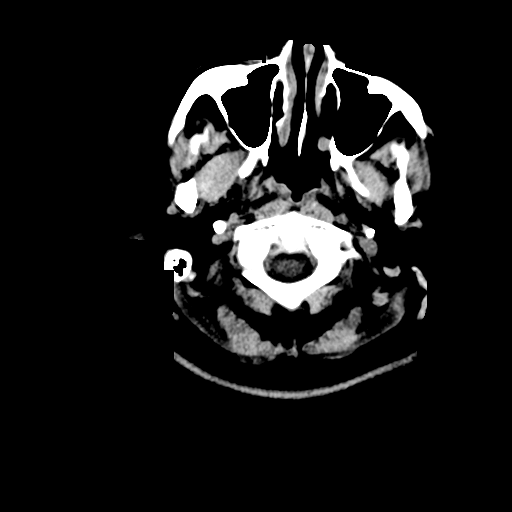
[im 2/36  bone]
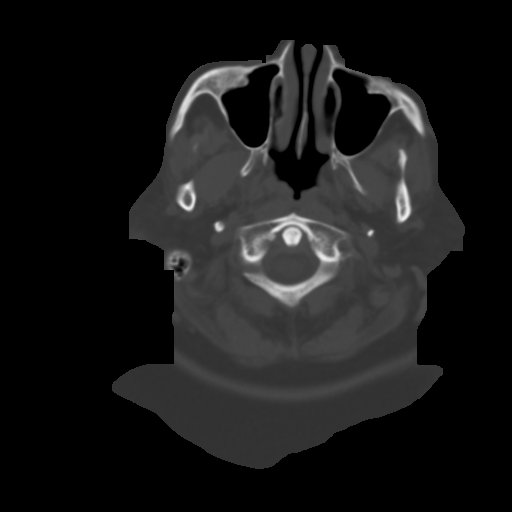
[im 4/36  brain]
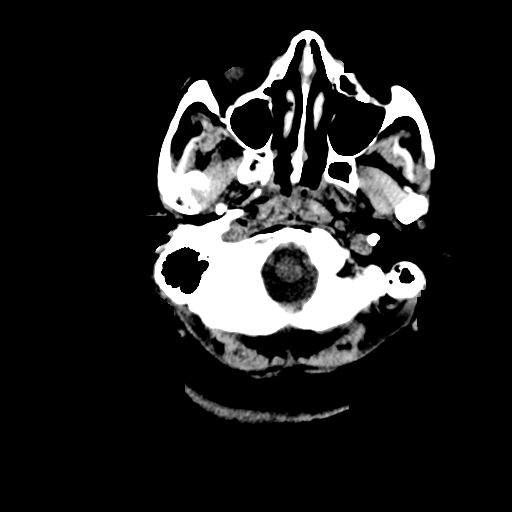
[im 7/36  brain]
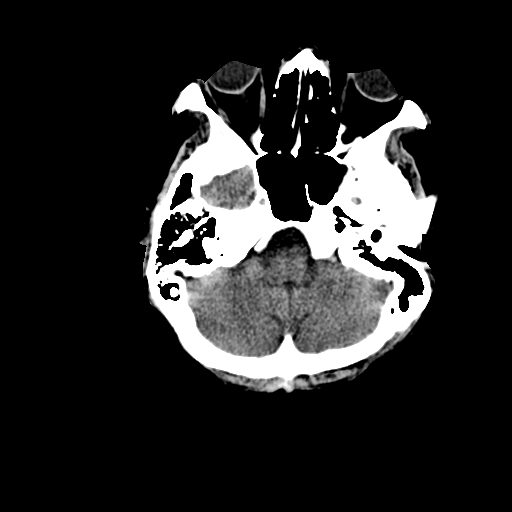
[im 9/36  brain]
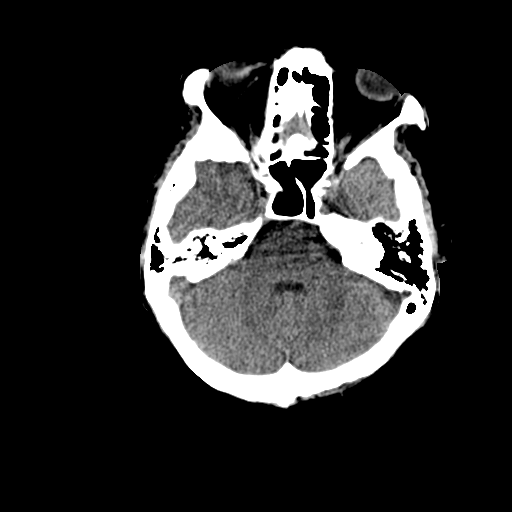
[im 11/36  brain]
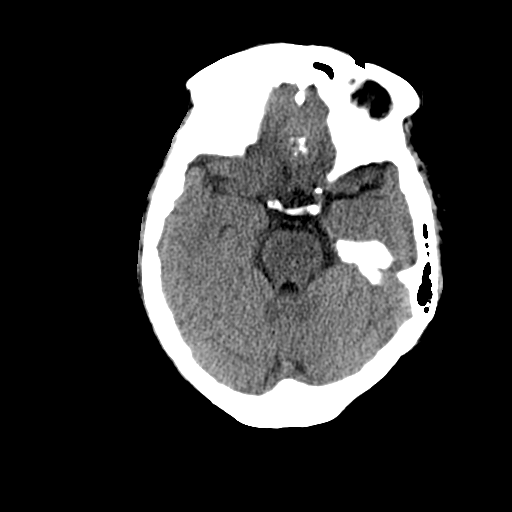
[im 11/36  bone]
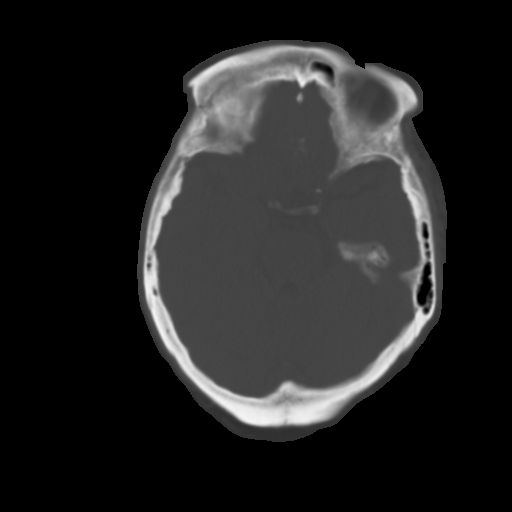
[im 14/36  brain]
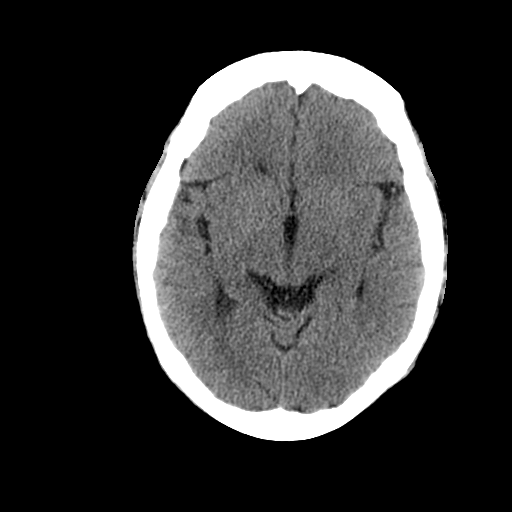
[im 16/36  brain]
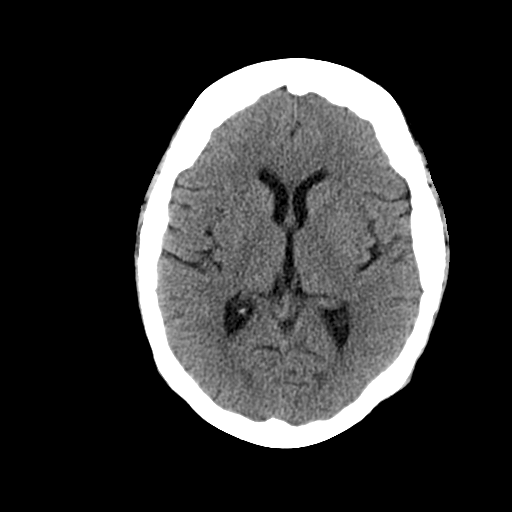
[im 19/36  brain]
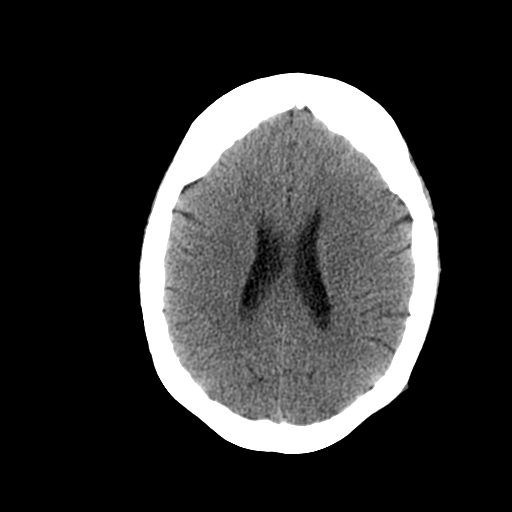
[im 20/36  brain]
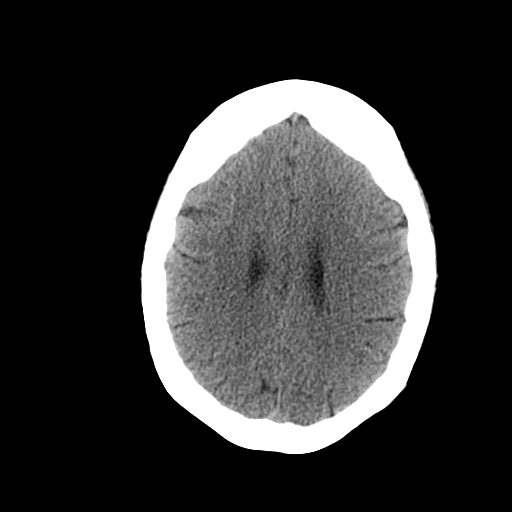
[im 20/36  bone]
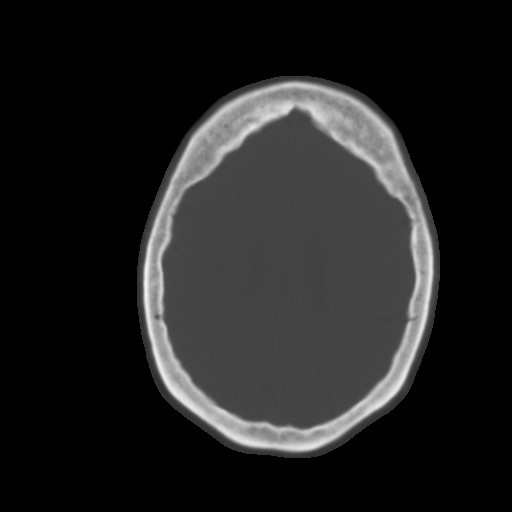
[im 22/36  brain]
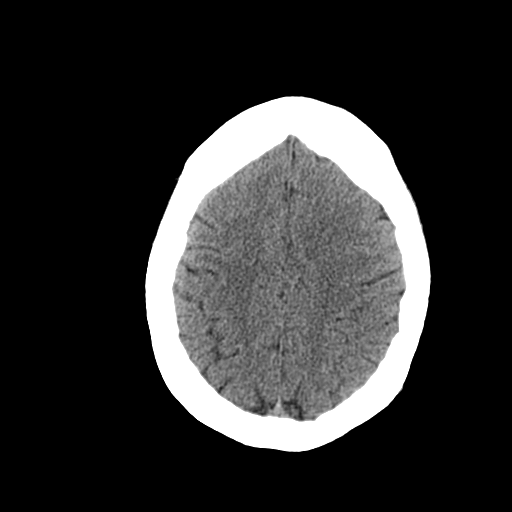
[im 25/36  brain]
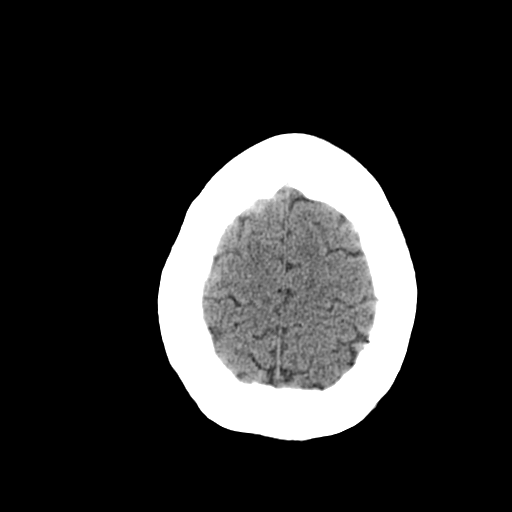
[im 27/36  brain]
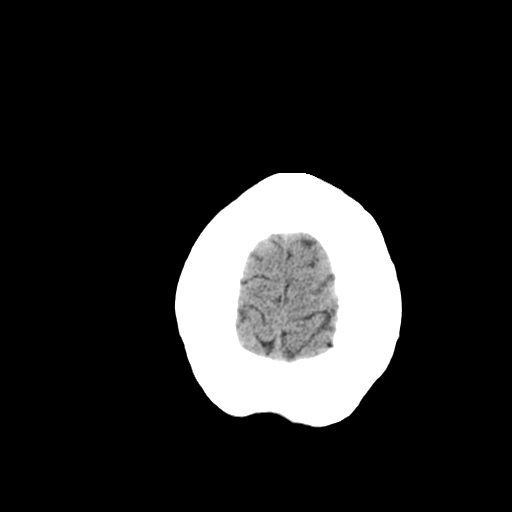
[im 29/36  brain]
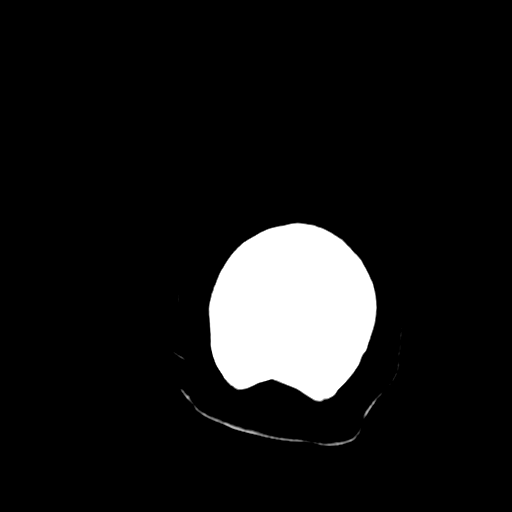
[im 29/36  bone]
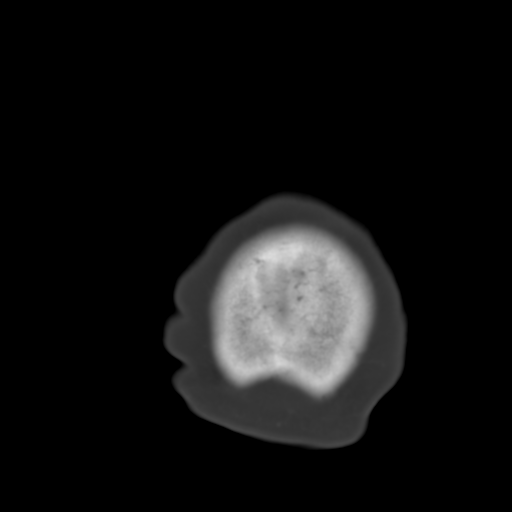
[im 32/36  brain]
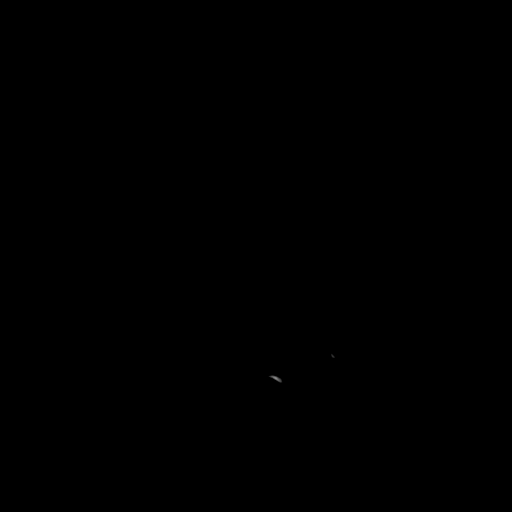
[im 34/36  brain]
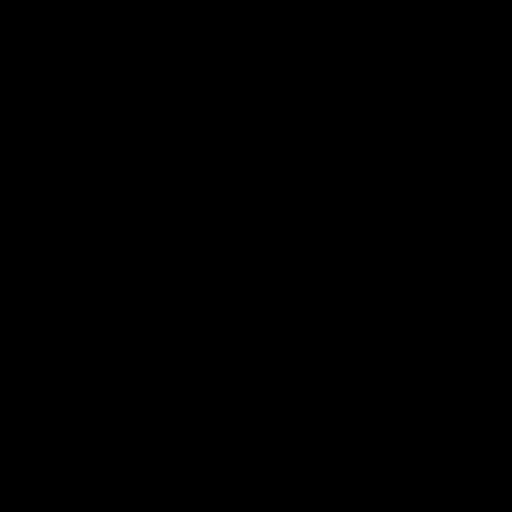

[15 of 30 positions shown; findings below may reference images not displayed]

FINDINGS: Ventricular system normal in size and appearance for age.
Midline mass in the frontal region just above the cribiform plate
with associated calcifications, as noted previously.  No mass
lesions elsewhere.  No midline shift.  No acute hemorrhage or
hematoma.  No extra-axial fluid collections.  Geographic low
attenuation in the right frontal lobe.  No focal brain parenchymal
abnormalities elsewhere.

Mild changes of hyperostosis frontalis interna. Visualized
paranasal sinuses, mastoid air cells, and middle ear cavities well-
aerated.  Bilateral carotid siphon and left vertebral artery
atherosclerosis.
IMPRESSION: 1.  Possible subacute non-hemorrhagic right frontal lobe stroke,
new since the examination 3 months ago.
2.  Stable partially calcified meningioma arising in the midline
frontal region from the cribiform plate.

## 2013-02-25 ENCOUNTER — Other Ambulatory Visit: Payer: Self-pay | Admitting: Family Medicine

## 2013-02-25 MED ORDER — LINAGLIPTIN 5 MG PO TABS: 5.0000 mg | ORAL_TABLET | Freq: Every day | ORAL | Status: DC

## 2013-02-25 NOTE — Telephone Encounter (Signed)
Patient aware.

## 2013-02-25 NOTE — Telephone Encounter (Signed)
Pls let her know and fax in script for change to [employer] effective January 1 based on formulary change it is printed

## 2013-03-03 IMAGING — RF DG ESOPHAGUS
4 series · 15 of 24 positions shown · non-contrast
Comparison: None.

CLINICAL DATA: Dysphasia; history of esophageal dilatation

ESOPHOGRAM / BARIUM SWALLOW / BARIUM TABLET STUDY
TECHNIQUE: Combined double contrast and single contrast
examination performed using effervescent crystals, thick barium
liquid, and thin barium liquid.  The patient was observed with
fluoroscopy swallowing a 13mm barium sulphate tablet.

[Series 1: run · 4 of 30 slices shown (1 of 4)]
[im 1/30]
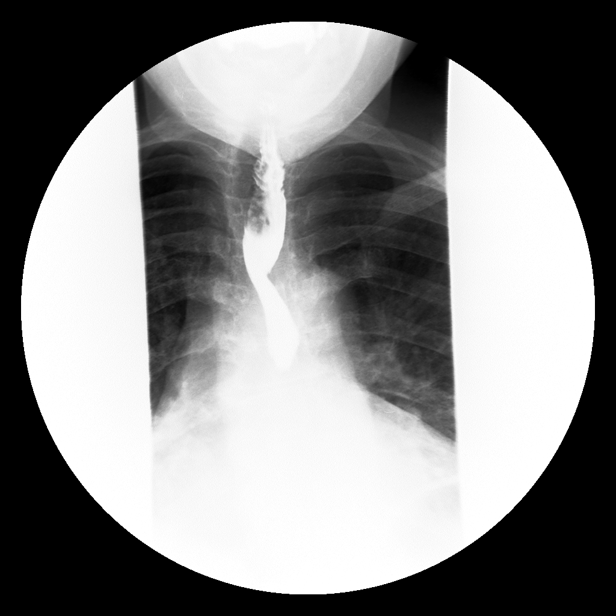
[im 10/30]
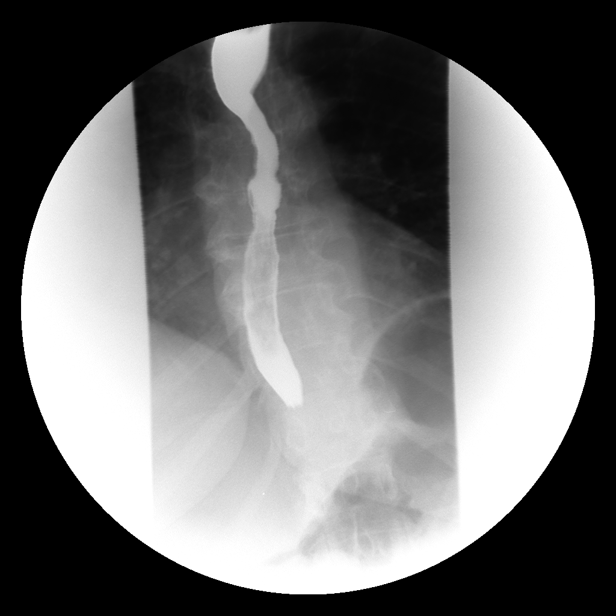
[im 20/30]
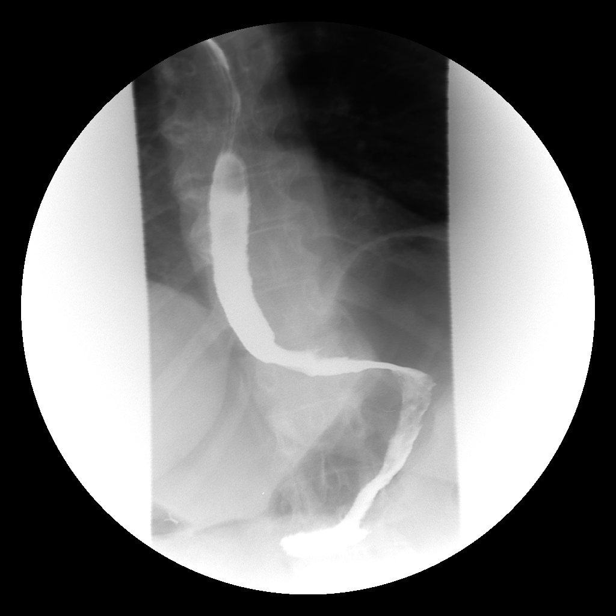
[im 25/30]
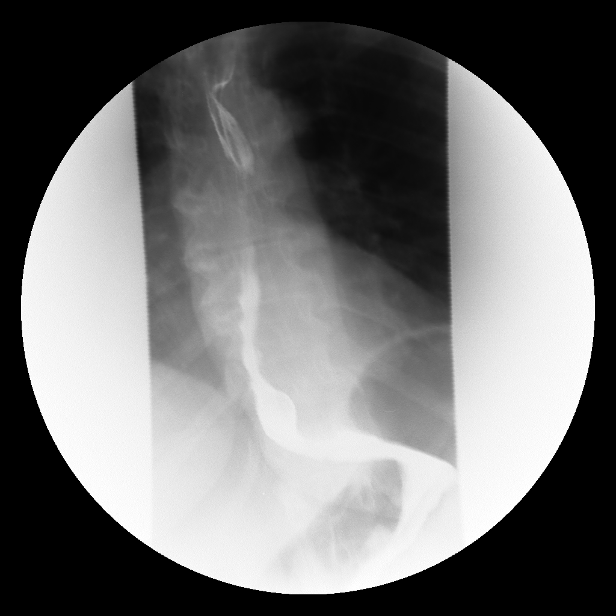

[Series 2: run · 7 of 35 slices shown (2 of 4)]
[im 1/35]
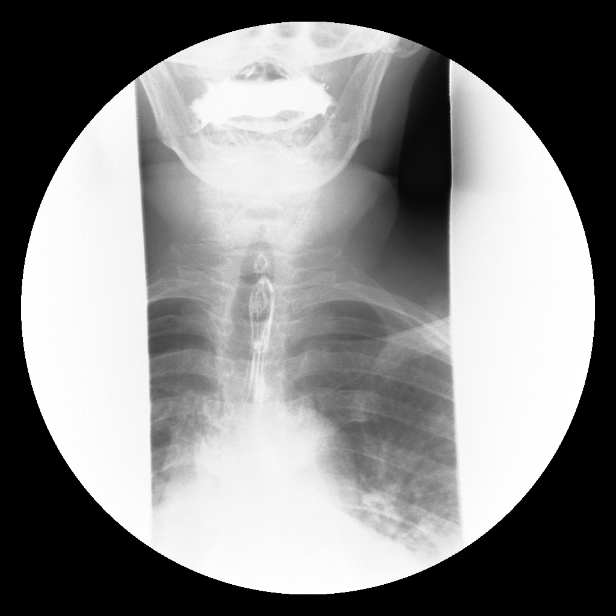
[im 4/35]
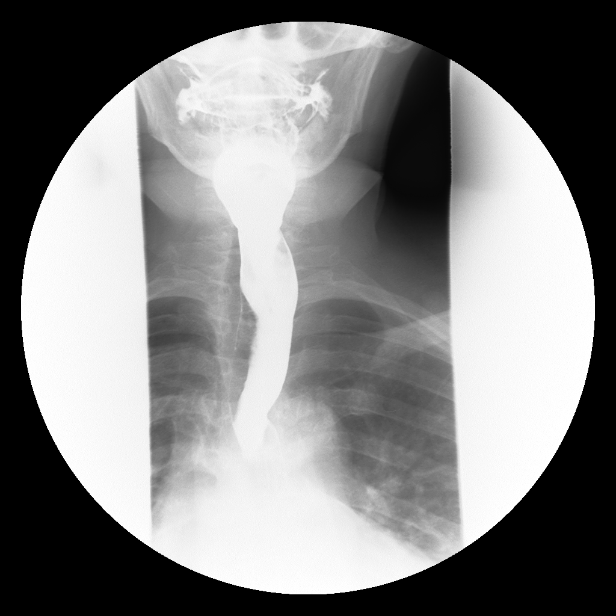
[im 12/35]
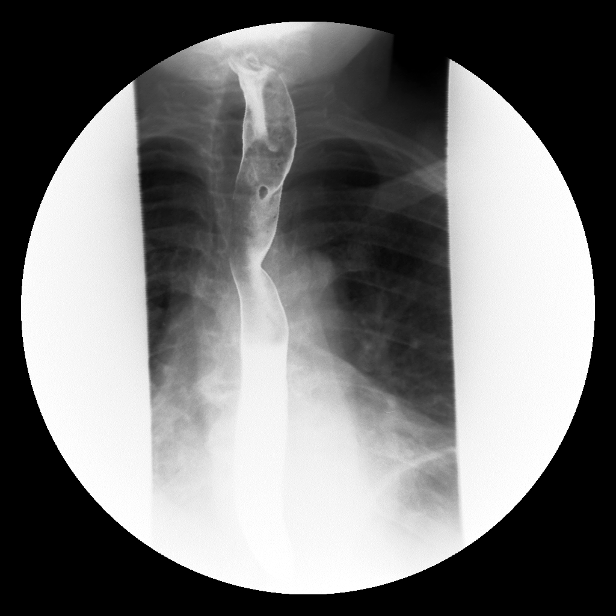
[im 19/35]
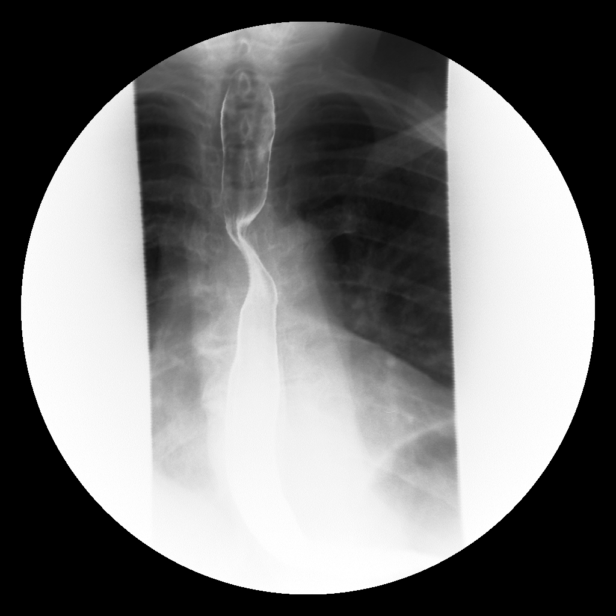
[im 23/35]
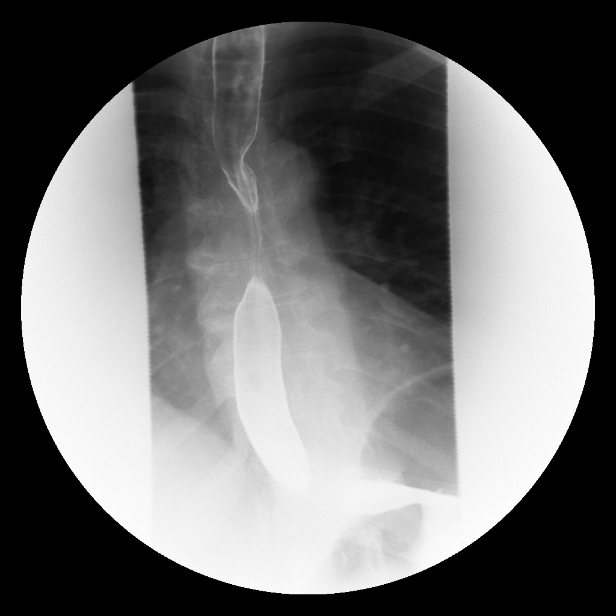
[im 31/35]
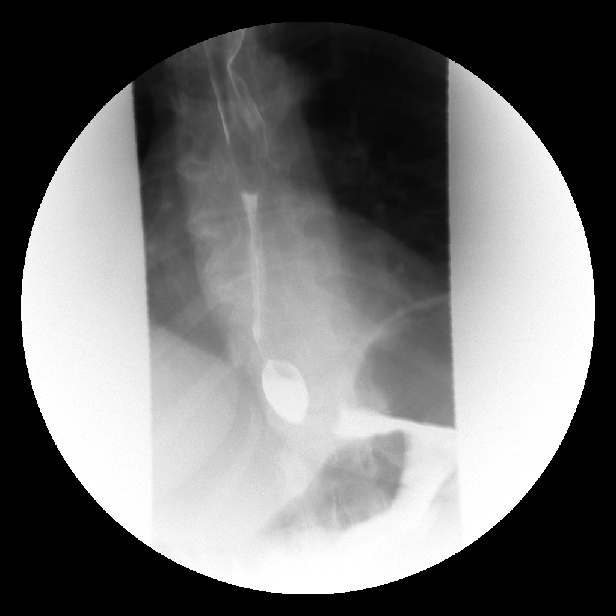
[im 35/35]
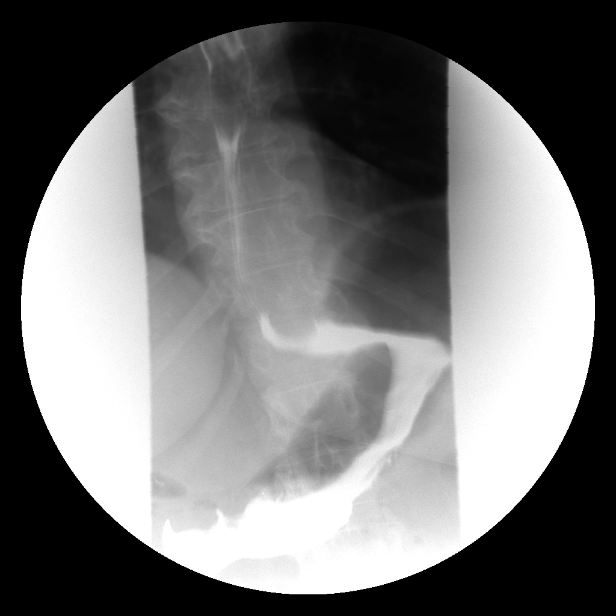

[Series 3: run · 3 of 22 slices shown (3 of 4)]
[im 5/22]
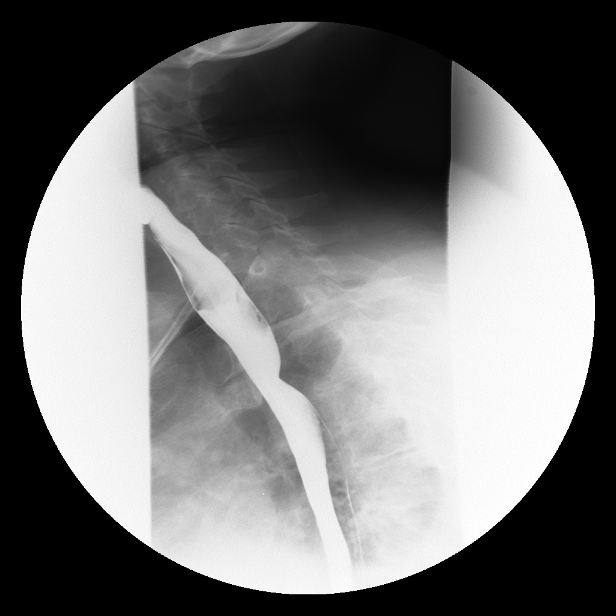
[im 13/22]
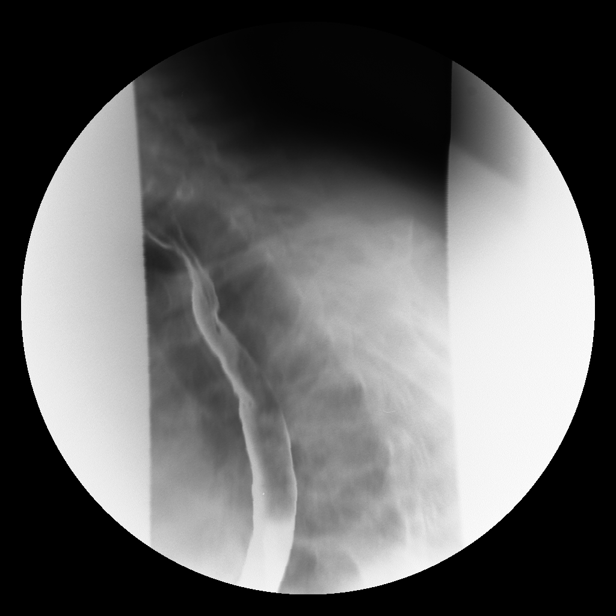
[im 17/22]
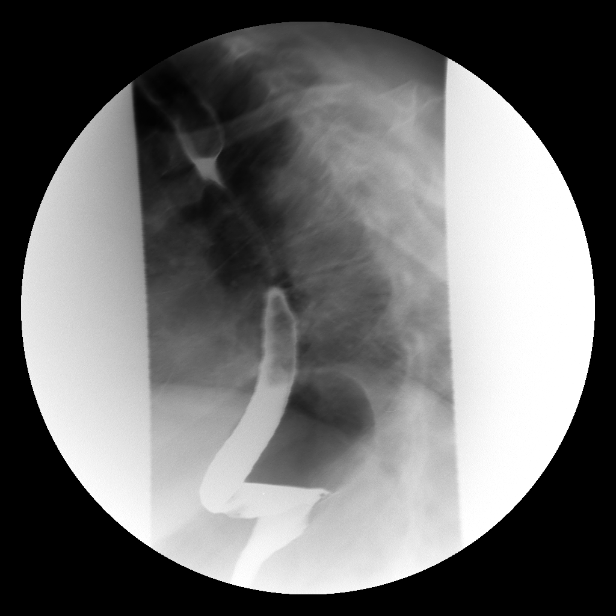

[Series 4: run · 1 of 1 slices shown (4 of 4)]
[im 1/1]
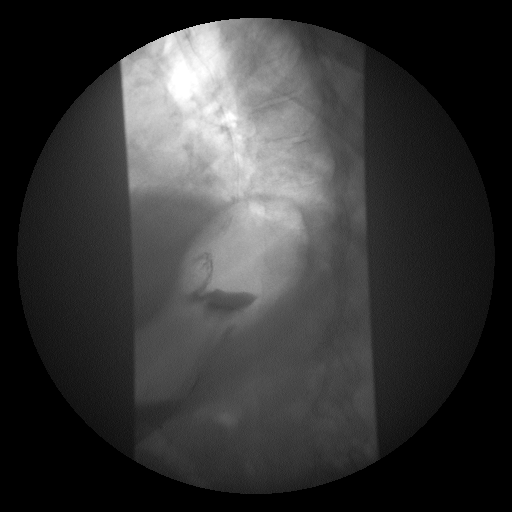

[15 of 24 positions shown; findings below may reference images not displayed]

FINDINGS: Tertiary contractions are present throughout the
esophagus during the exam.  There is no evidence of focal
stricture, fixed filling defect, or ulceration, however.  A 12.5 mm
barium tablet passes the EG junction without holdup.  There is no
extravasation of contrast.
IMPRESSION: There are prominent tertiary contractions with swallowing.

No evidence of stricture or holdup of tablet...

## 2013-03-05 ENCOUNTER — Encounter (HOSPITAL_COMMUNITY): Payer: Self-pay | Admitting: Emergency Medicine

## 2013-03-05 ENCOUNTER — Emergency Department (HOSPITAL_COMMUNITY)
Admission: EM | Admit: 2013-03-05 | Discharge: 2013-03-05 | Disposition: A | Discharge status: Home / Self Care | Payer: Medicare Other | Attending: Emergency Medicine | Admitting: Emergency Medicine

## 2013-03-05 ENCOUNTER — Telehealth: Payer: Self-pay

## 2013-03-05 DIAGNOSIS — M549 Dorsalgia, unspecified: Secondary | ICD-10-CM | POA: Insufficient documentation

## 2013-03-05 DIAGNOSIS — G8929 Other chronic pain: Secondary | ICD-10-CM | POA: Insufficient documentation

## 2013-03-05 DIAGNOSIS — Z87891 Personal history of nicotine dependence: Secondary | ICD-10-CM | POA: Insufficient documentation

## 2013-03-05 DIAGNOSIS — F411 Generalized anxiety disorder: Secondary | ICD-10-CM | POA: Insufficient documentation

## 2013-03-05 DIAGNOSIS — Z8673 Personal history of transient ischemic attack (TIA), and cerebral infarction without residual deficits: Secondary | ICD-10-CM | POA: Insufficient documentation

## 2013-03-05 DIAGNOSIS — E119 Type 2 diabetes mellitus without complications: Secondary | ICD-10-CM | POA: Insufficient documentation

## 2013-03-05 DIAGNOSIS — Z8669 Personal history of other diseases of the nervous system and sense organs: Secondary | ICD-10-CM | POA: Insufficient documentation

## 2013-03-05 DIAGNOSIS — K5289 Other specified noninfective gastroenteritis and colitis: Secondary | ICD-10-CM | POA: Insufficient documentation

## 2013-03-05 DIAGNOSIS — K219 Gastro-esophageal reflux disease without esophagitis: Secondary | ICD-10-CM | POA: Insufficient documentation

## 2013-03-05 DIAGNOSIS — Z8739 Personal history of other diseases of the musculoskeletal system and connective tissue: Secondary | ICD-10-CM | POA: Insufficient documentation

## 2013-03-05 DIAGNOSIS — R1013 Epigastric pain: Secondary | ICD-10-CM | POA: Insufficient documentation

## 2013-03-05 DIAGNOSIS — I10 Essential (primary) hypertension: Secondary | ICD-10-CM | POA: Insufficient documentation

## 2013-03-05 DIAGNOSIS — IMO0002 Reserved for concepts with insufficient information to code with codable children: Secondary | ICD-10-CM | POA: Insufficient documentation

## 2013-03-05 DIAGNOSIS — K529 Noninfective gastroenteritis and colitis, unspecified: Secondary | ICD-10-CM

## 2013-03-05 DIAGNOSIS — Z862 Personal history of diseases of the blood and blood-forming organs and certain disorders involving the immune mechanism: Secondary | ICD-10-CM | POA: Insufficient documentation

## 2013-03-05 DIAGNOSIS — R072 Precordial pain: Secondary | ICD-10-CM | POA: Insufficient documentation

## 2013-03-05 DIAGNOSIS — M810 Age-related osteoporosis without current pathological fracture: Secondary | ICD-10-CM | POA: Insufficient documentation

## 2013-03-05 DIAGNOSIS — F319 Bipolar disorder, unspecified: Secondary | ICD-10-CM | POA: Insufficient documentation

## 2013-03-05 DIAGNOSIS — Z7902 Long term (current) use of antithrombotics/antiplatelets: Secondary | ICD-10-CM | POA: Insufficient documentation

## 2013-03-05 DIAGNOSIS — Z79899 Other long term (current) drug therapy: Secondary | ICD-10-CM | POA: Insufficient documentation

## 2013-03-05 DIAGNOSIS — E039 Hypothyroidism, unspecified: Secondary | ICD-10-CM | POA: Insufficient documentation

## 2013-03-05 DIAGNOSIS — Z880 Allergy status to penicillin: Secondary | ICD-10-CM | POA: Insufficient documentation

## 2013-03-05 LAB — COMPREHENSIVE METABOLIC PANEL
ALT: 19 U/L (ref 0–35)
Albumin: 3.7 g/dL (ref 3.5–5.2)
Alkaline Phosphatase: 139 U/L — ABNORMAL HIGH (ref 39–117)
BUN: 22 mg/dL (ref 6–23)
CO2: 23 mEq/L (ref 19–32)
Calcium: 9.6 mg/dL (ref 8.4–10.5)
Creatinine, Ser: 0.68 mg/dL (ref 0.50–1.10)
GFR calc Af Amer: >90 mL/min (ref >90)
GFR calc non Af Amer: >90 mL/min (ref >90)
Glucose, Bld: 239 mg/dL — ABNORMAL HIGH (ref 70–99)
Potassium: 3.7 mEq/L (ref 3.5–5.1)
Sodium: 142 mEq/L (ref 135–145)
Total Protein: 7.6 g/dL (ref 6.0–8.3)

## 2013-03-05 LAB — CBC WITH DIFFERENTIAL/PLATELET
Basophils Absolute: 0 10*3/uL (ref 0.0–0.1)
Basophils Relative: 0 % (ref 0–1)
Eosinophils Absolute: 0 10*3/uL (ref 0.0–0.7)
HCT: 37.2 % (ref 36.0–46.0)
Hemoglobin: 11.3 g/dL — ABNORMAL LOW (ref 12.0–15.0)
Lymphocytes Relative: 10 % — ABNORMAL LOW (ref 12–46)
MCHC: 30.4 g/dL (ref 30.0–36.0)
Monocytes Relative: 3 % (ref 3–12)
Neutro Abs: 7.8 10*3/uL — ABNORMAL HIGH (ref 1.7–7.7)
Neutrophils Relative %: 86 % — ABNORMAL HIGH (ref 43–77)
Platelets: 234 10*3/uL (ref 150–400)
RDW: 14.2 % (ref 11.5–15.5)
WBC: 9 10*3/uL (ref 4.0–10.5)

## 2013-03-05 LAB — TROPONIN I: Troponin I: <0.3 ng/mL (ref <0.30)

## 2013-03-05 MED ORDER — ONDANSETRON HCL 4 MG/2ML IJ SOLN
4.0000 mg | Freq: Once | INTRAMUSCULAR | Status: AC
  Administered 2013-03-05: 4 mg via INTRAVENOUS
  Filled 2013-03-05: qty 2

## 2013-03-05 MED ORDER — SODIUM CHLORIDE 0.9 % IV BOLUS (SEPSIS)
1000.0000 mL | Freq: Once | INTRAVENOUS | Status: AC
  Administered 2013-03-05: 1000 mL via INTRAVENOUS

## 2013-03-05 MED ORDER — MORPHINE SULFATE 4 MG/ML IJ SOLN
4.0000 mg | Freq: Once | INTRAMUSCULAR | Status: AC
  Administered 2013-03-05: 4 mg via INTRAVENOUS
  Filled 2013-03-05: qty 1

## 2013-03-05 MED ORDER — PROMETHAZINE HCL 25 MG PO TABS: 25.0000 mg | ORAL_TABLET | Freq: Four times a day (QID) | ORAL | Status: DC | PRN

## 2013-03-05 MED ORDER — OXYCODONE-ACETAMINOPHEN 5-325 MG PO TABS: 1.0000 | ORAL_TABLET | ORAL | Status: DC | PRN

## 2013-03-05 NOTE — ED Provider Notes (Signed)
CSN: [id number]     Arrival date & time 03/05/13  1340 History  This chart was scribed for [name] Hutching, MD by Bennett Scrape, ED Scribe. This patient was seen in room APA14/APA14 and the patient's care was started at 3:34 PM.   Chief Complaint  Patient presents with  . Emesis    The history is provided by the patient. No language interpreter was used.    HPI Comments: [name] is a 62 y.o. female who presents to the Emergency Department complaining of sudden onset, constant epigastric pain and substernal CP described as pressure with associated nausea and emesis that started around 8:00 AM this morning. Pt denies radiation stating that they are separate pains. Niece states that the pt's nurse called her and told her of the symptoms at which point she brought the pt to the ED. Pt denies any other symptoms. She has a h/o DM, last CBG was 180. Severity is mild to moderate. Nothing makes symptoms better or worse. PCP is Dr. Lodema Hong.    Past Medical History  Diagnosis Date  . Bipolar disorder   . CVA (cerebral infarction)   . Pancreatitis 2006    due to Depakote with normal EUS   . Osteoporosis   . Chronic back pain   . Diabetes mellitus   . Trigger finger   . Anxiety disorder   . Hypertension   . Migraines   . Diverticulosis     TCS 9/08 by Dr. Lina Sar for diarrhea . Bx for micro scopic colitis negative.   . Schatzki's ring     non critical / EGD with ED 8/2011with RMR  . S/P colonoscopy 4098,1191, 2011    left-sided diverticula, hx of simple adenomas . 2011, random bx negative for microscopic colitis  . Glaucoma   . Allergic rhinitis   . Hypothyroidism     thyroid goiter  . Anemia   . Blood transfusion   . GERD (gastroesophageal reflux disease)   . Stroke     left sided weakness  . Seizures     unknown etiology-on meds-last seizure was 3 yerars ago  . Anxiety   . Depression   . Metabolic encephalopathy 08/03/2011  . Sleep apnea   . Arthritis   . Gum symptoms      infection on antibiotic   Past Surgical History  Procedure Laterality Date  . Abdominal hysterectomy    . Cholecystectomy    . Ovarian cyst removal    . Carpal tunnel release  07/22/04    left/ Dr. Romeo Apple   . Breast reduction surgery    . Bilateral cataract surgery    . Biopsy of thyroid gland  2009  . Surgical excision of 3 tumors from right thigh and right buttock  and left upper thigh  2010  . Back surgery  July 2012  . Spine surgery  09/29/2010    dr brooks  . Maloney dilation  12/29/2010    RMR;  . Esophagogastroduodenoscopy  12/29/2010    Rourk-Retained food in the esophagus and stomach, small hiatal hernia, status post Maloney dilation of the esophagus  . Craniotomy  11/23/2011    Procedure: CRANIOTOMY TUMOR EXCISION;  Surgeon: Hewitt Shorts, MD;  Location: [facility];  Service: Neurosurgery;  Laterality: N/A;  Craniotomy for tumor resection  . Brain surgery  11/2011    resection of meningioma  . Colonoscopy N/A 09/25/2012    YNW:[id number] diverticulosis.  colonic polyp-removed : tubular adenoma  . Esophagogastroduodenoscopy N/A  09/25/2012    [id number] atonic baggy esophagus status post Maloney dilation 56 F. Hiatal hernia  . Givens capsule study N/A 01/15/2013    NORMAL.    Family History  Problem Relation Age of Onset  . Heart attack Mother   . Pneumonia Father   . Kidney failure Father   . Cancer Sister     pancreatic  . Diabetes Brother   . Hypertension Brother   . Colon cancer Neg Hx   . Anesthesia problems Neg Hx   . Hypotension Neg Hx   . Malignant hyperthermia Neg Hx   . Pseudochol deficiency Neg Hx    History  Substance Use Topics  . Smoking status: Former Smoker -- 0.25 packs/day for 7 years    Types: Cigarettes    Quit date: 09/22/2012  . Smokeless tobacco: Never Used     Comment: started at 24 smoked 6 yrs and stopped. restarted 2 months ago  . Alcohol Use: No     Comment: 5 cigs/day    OB History   Grav Para Term Preterm Abortions  TAB SAB Ect Mult Living   6 1 1  5  5         Review of Systems  A complete 10 system review of systems was obtained and all systems are negative except as noted in the HPI and PMH.   Allergies  Cephalexin; Iron; Milk-related compounds; Penicillins; and Phenazopyridine hcl  Home Medications   Current Outpatient Rx  Name  Route  Sig  Dispense  Refill  . cloNIDine (CATAPRES) 0.3 MG tablet   Oral   Take 1 tablet (0.3 mg total) by mouth 2 (two) times daily.   60 tablet   11     Decreased dose clonidine effective 06/14/2012 Clo ...   . clopidogrel (PLAVIX) 75 MG tablet   Oral   Take 75 mg by mouth daily with breakfast.         . diclofenac sodium (VOLTAREN) 1 % GEL   Topical   Apply 2 g topically daily as needed (Pain).         [name] diphenoxylate-atropine (LOMOTIL) 2.5-0.025 MG per tablet   Oral   Take 1 tablet by mouth 2 (two) times daily as needed for diarrhea or loose stools.   30 tablet   0   . fluticasone (FLONASE) 50 MCG/ACT nasal spray   Nasal   Place 1 spray into the nose daily. Allergies   16 g   3   . glipiZIDE (GLUCOTROL XL) 2.5 MG 24 hr tablet   Oral   Take 2.5 mg by mouth daily with breakfast.         . hydrochlorothiazide (MICROZIDE) 12.5 MG capsule   Oral   Take 12.5 mg by mouth daily.         . hydroxychloroquine (PLAQUENIL) 200 MG tablet   Oral   Take 200 mg by mouth daily.         . hydrOXYzine (ATARAX/VISTARIL) 10 MG tablet   Oral   Take 10 mg by mouth 3 (three) times daily as needed for itching.          . lamoTRIgine (LAMICTAL) 100 MG tablet   Oral   Take 150 mg by mouth every morning.          [name] levothyroxine (LEVOXYL) 50 MCG tablet   Oral   Take 50 mcg by mouth every morning.          . Linaclotide (LINZESS) 290 MCG  CAPS capsule   Oral   Take 290 mcg by mouth daily as needed.         . linagliptin (TRADJENTA) 5 MG TABS tablet   Oral   Take 1 tablet (5 mg total) by mouth daily.   30 tablet   5     Discontinue  onglyza effective March 13, 2013, due ...   . LORazepam (ATIVAN) 1 MG tablet   Oral   Take 1 mg by mouth 4 (four) times daily.          [name] losartan (COZAAR) 100 MG tablet   Oral   Take 1 tablet (100 mg total) by mouth daily.   30 tablet   11     Dose increase effective 02/17/2013   . meclizine (ANTIVERT) 12.5 MG tablet   Oral   Take 12.5 mg by mouth 3 (three) times daily as needed.         . methocarbamol (ROBAXIN) 500 MG tablet   Oral   Take 500 mg by mouth daily.          . metoprolol (LOPRESSOR) 50 MG tablet   Oral   Take 1 tablet (50 mg total) by mouth 2 (two) times daily.   60 tablet   5   . mirtazapine (REMERON) 30 MG tablet   Oral   Take 30 mg by mouth at bedtime.          . montelukast (SINGULAIR) 10 MG tablet   Oral   Take 10 mg by mouth daily.         [name] ofloxacin (OCUFLOX) 0.3 % ophthalmic solution   Both Eyes   Place 1 drop into both eyes daily.         [name] oxyCODONE-acetaminophen (PERCOCET) 10-325 MG per tablet   Oral   Take 1 tablet by mouth 3 (three) times daily as needed for pain.          . pregabalin (LYRICA) 75 MG capsule   Oral   Take 75 mg by mouth at bedtime.          . RABEprazole (ACIPHEX) 20 MG tablet   Oral   Take 1 tablet (20 mg total) by mouth 2 (two) times daily.   60 tablet   11   . RESTASIS 0.05 % ophthalmic emulsion   Both Eyes   Place 1 drop into both eyes 2 (two) times daily.         . rosuvastatin (CRESTOR) 5 MG tablet   Oral   Take 1 tablet (5 mg total) by mouth at bedtime.   30 tablet   11     Dose reduction effective 06/14/2012   . saxagliptin HCl (ONGLYZA) 5 MG TABS tablet   Oral   Take 5 mg by mouth daily with breakfast.         . sertraline (ZOLOFT) 100 MG tablet   Oral   Take 150 mg by mouth at bedtime.          [name] thioridazine (MELLARIL) 10 MG tablet   Oral   Take 20 mg by mouth at bedtime.          . traZODone (DESYREL) 50 MG tablet   Oral   Take 50 mg by mouth at bedtime.           . Vitamin D, Ergocalciferol, (DRISDOL) 50000 UNITS CAPS capsule   Oral   Take 50,000 Units by mouth every 7 (seven) days.         [name]  clobetasol (TEMOVATE) 0.05 % external solution   Topical   Apply 1 application topically 2 (two) times daily.          [name] nystatin (MYCOSTATIN) powder   Topical   Apply topically 4 (four) times daily.         . promethazine (PHENERGAN) 12.5 MG tablet   Oral   Take 12.5 mg by mouth every 6 (six) hours as needed. FOR NAUSEA AND VOMITING          Triage Vitals: BP 188/79  Pulse 77  Temp(Src) 98.5 F (36.9 C)  Resp 18  SpO2 94%  Physical Exam  Nursing note and vitals reviewed. Constitutional: She is oriented to person, place, and time. She appears well-developed and well-nourished.  HENT:  Head: Normocephalic and atraumatic.  Eyes: Conjunctivae and EOM are normal. Pupils are equal, round, and reactive to light.  Neck: Normal range of motion. Neck supple.  Cardiovascular: Normal rate, regular rhythm and normal heart sounds.   Pulmonary/Chest: Effort normal and breath sounds normal.  Abdominal: Soft. Bowel sounds are normal. There is tenderness (minimal epigastric tenderness).  Musculoskeletal: Normal range of motion.  Neurological: She is alert and oriented to person, place, and time.  Skin: Skin is warm and dry.  Psychiatric: She has a normal mood and affect. Her behavior is normal.    ED Course  Procedures (including critical care time)  Medications  sodium chloride 0.9 % bolus 1,000 mL (not administered)  sodium chloride 0.9 % bolus 1,000 mL (not administered)  morphine 4 MG/ML injection 4 mg (not administered)  ondansetron (ZOFRAN) injection 4 mg (not administered)    DIAGNOSTIC STUDIES: Oxygen Saturation is 94% on RA, adequate by my interpretation.    COORDINATION OF CARE: 3:38 PM-Discussed treatment plan which includes pain medications and IV fluids with pt at bedside and pt agreed to plan.   Labs Review Labs  Reviewed  COMPREHENSIVE METABOLIC PANEL - Abnormal; Notable for the following:    Glucose, Bld 239 (*)    Alkaline Phosphatase 139 (*)    Total Bilirubin 0.2 (*)    All other components within normal limits  CBC WITH DIFFERENTIAL - Abnormal; Notable for the following:    Hemoglobin 11.3 (*)    MCV 73.8 (*)    MCH 22.4 (*)    Neutrophils Relative % 86 (*)    Neutro Abs 7.8 (*)    Lymphocytes Relative 10 (*)    All other components within normal limits  LIPASE, BLOOD  TROPONIN I   Imaging Review No results found.  EKG Interpretation    Date/Time:  Wednesday March 05 2013 16:11:31 EST Ventricular Rate:  89 PR Interval:  158 QRS Duration: 88 QT Interval:  376 QTC Calculation: 457 R Axis:   57 Text Interpretation:  Normal sinus rhythm Normal ECG When compared with ECG of 11-Jun-2012 15:10, Vent. rate has increased BY  31 BPM Confirmed by Altin Sease  MD, Amily Depp ([id number]) on 03/05/2013 5:28:48 PM            MDM  No diagnosis found. Symptoms seemed to be more correlated with epigastric discomfort. White count, liver functions, lipase, troponin, EKG normal. No acute abdomen. Discharge medications Percocet and Phenergan 25 mg  I personally performed the services described in this documentation, which was scribed in my presence. The recorded information has been reviewed and is accurate.    [name] Hutching, MD 03/05/13 [phone]

## 2013-03-05 NOTE — Telephone Encounter (Signed)
Britt Boozer called from [facility] and states that she saw patient this am and she is so nauseated she can't take her meds. BP elevated to 174/88 but all vitals ok. Advised patient to take one of her nausea meds that she has on file and if she worsens to go to the urgent care or ER.

## 2013-03-05 NOTE — ED Notes (Signed)
Pt c/o abd pain/n/v since 0800 this am.

## 2013-03-05 NOTE — ED Notes (Signed)
Pt given a sprite zero.

## 2013-03-09 ENCOUNTER — Encounter: Payer: Self-pay | Admitting: Family Medicine

## 2013-03-09 DIAGNOSIS — F17200 Nicotine dependence, unspecified, uncomplicated: Secondary | ICD-10-CM | POA: Insufficient documentation

## 2013-03-09 NOTE — Assessment & Plan Note (Addendum)
Ongoing nicotine se , pt had initially stated that she was nocotine free, latercalled back to "admit " she is still smoking, cessation counseling done Patient counseled for approximately 5 minutes regarding the health risks of ongoing nicotine use, specifically all types of cancer, heart disease, stroke and respiratory failure. The options available for help with cessation ,the behavioral changes to assist the process, and the option to either gradully reduce usage  Or abruptly stop.is also discussed. Pt is also encouraged to set specific goals in number of cigarettes used daily, as well as to set a quit date.

## 2013-03-09 NOTE — Assessment & Plan Note (Signed)
Uncontrolled, dose increase of current medication, administered during visit. Sooner follow up

## 2013-03-09 NOTE — Assessment & Plan Note (Signed)
Uncontrolled pain with limitation in left shoulder movement, ortho and neurosurgery to determine which is the bigger problem as far as the need for surgical intervention is concened

## 2013-03-11 ENCOUNTER — Telehealth: Payer: Self-pay | Admitting: Orthopedic Surgery

## 2013-03-11 NOTE — Telephone Encounter (Signed)
Faxed most recent office notes to [facility], Dr [name], to fax# [phone], ph [phone], as per request, continuity of care.  Patient also had called, 03/10/13, relates that she is to see another orthopedist, per Dr [name].  Appointment pending.

## 2013-03-12 ENCOUNTER — Encounter (INDEPENDENT_AMBULATORY_CARE_PROVIDER_SITE_OTHER): Payer: Self-pay

## 2013-03-12 ENCOUNTER — Ambulatory Visit (INDEPENDENT_AMBULATORY_CARE_PROVIDER_SITE_OTHER): Payer: Medicare Other | Admitting: Family Medicine

## 2013-03-12 VITALS — BP 170/74 | HR 76 | Resp 18 | Wt 161.0 lb

## 2013-03-12 DIAGNOSIS — Z79899 Other long term (current) drug therapy: Secondary | ICD-10-CM

## 2013-03-12 DIAGNOSIS — E1149 Type 2 diabetes mellitus with other diabetic neurological complication: Secondary | ICD-10-CM

## 2013-03-12 DIAGNOSIS — E785 Hyperlipidemia, unspecified: Secondary | ICD-10-CM

## 2013-03-12 DIAGNOSIS — K3184 Gastroparesis: Secondary | ICD-10-CM

## 2013-03-12 DIAGNOSIS — I10 Essential (primary) hypertension: Secondary | ICD-10-CM

## 2013-03-12 DIAGNOSIS — M5010 Cervical disc disorder with radiculopathy, unspecified cervical region: Secondary | ICD-10-CM

## 2013-03-12 DIAGNOSIS — M5412 Radiculopathy, cervical region: Secondary | ICD-10-CM

## 2013-03-12 DIAGNOSIS — E1143 Type 2 diabetes mellitus with diabetic autonomic (poly)neuropathy: Secondary | ICD-10-CM

## 2013-03-12 MED ORDER — CLONIDINE HCL 0.3 MG PO TABS: ORAL_TABLET | ORAL | Status: DC

## 2013-03-12 NOTE — Progress Notes (Signed)
   Subjective:    Patient ID: [name], female    DOB: 1950-12-14, 62 y.o.   MRN: [id number]  HPI Pt in primarily for re evaluation of uncontrolled BP.States was taken to the Ed  For altered conciousness and her BP  Was extremely high at tha t time also Her only c/o continues to beof uncontrolled and ongoing left neck, shoulder and upper extremity pain. Currently being cared for by ortho as well as neurosurgery.   Review of Systems See HPI Denies recent fever or chills. Denies sinus pressure, nasal congestion, ear pain or sore throat. Denies chest congestion, productive cough or wheezing. Denies chest pains, palpitations and leg swelling  Denies uncontrolled depression, anxiety or insomnia. Denies skin break down or rash.        Objective:   Physical Exam Patient alert and oriented and in no cardiopulmonary distress.  HEENT: No facial asymmetry, EOMI, no sinus tenderness,  oropharynx pink and moist.  Neck deceased ROM, no JVD, no adenopathy.  Chest: Clear to auscultation bilaterally.  CVS: S1, S2 no murmurs, no S3.  ABD: Soft non tender. Bowel sounds normal.  Ext: No edema  Psych: Good eye contact, normal affect. Memory intact not anxious or depressed appearing.  CNS: CN 2-12 intact.      Assessment & Plan:

## 2013-03-12 NOTE — Patient Instructions (Signed)
F/u first week in February.  Increase in clonidine to every 8 hours, as blood pressure is still too high  HBA1C, cmp and EGFR  approx 3 days before visit

## 2013-03-13 ENCOUNTER — Encounter: Payer: Self-pay | Admitting: Family Medicine

## 2013-03-13 NOTE — Assessment & Plan Note (Signed)
Uncontrolled, increase dose of clonidine DASH diet and commitment to daily physical activity for a minimum of 30 minutes discussed and encouraged, as a part of hypertension management. The importance of attaining a healthy weight is also discussed.

## 2013-03-13 NOTE — Assessment & Plan Note (Signed)
Uncontrolled pain, managed through pain clinic, may have upcoming surgery on left shoulder or neck

## 2013-03-13 NOTE — Assessment & Plan Note (Signed)
Uncontrolled Hyperlipidemia:Low fat diet discussed and encouraged.

## 2013-03-13 NOTE — Assessment & Plan Note (Signed)
Patient advised to reduce carb and sweets, commit to regular physical activity, take meds as prescribed, test blood as directed, and attempt to lose weight, to improve blood sugar control. Updated lab next visit

## 2013-03-14 NOTE — Progress Notes (Signed)
REVIEWED.  

## 2013-03-17 ENCOUNTER — Telehealth: Payer: Self-pay

## 2013-03-17 ENCOUNTER — Telehealth: Payer: Self-pay | Admitting: Internal Medicine

## 2013-03-17 NOTE — Telephone Encounter (Signed)
Advise to continue same med doseGive med more time.. If develops change in neuro symptoms , which are not "normal" for her, needs to got to ED, otherwise ensure she keeps f/u here. BP when last here before the dose was increased is lress than she is reporting, pls make her aware of this

## 2013-03-17 NOTE — Telephone Encounter (Signed)
Patient states lastnight her BP was 192/101 and today it was 182/97. Slight headache and some blurry vision but that's not new for her as a lot of her meds cause these symptoms. Do you want her to increase her meds or give them more time to work? Please advise

## 2013-03-17 NOTE — Telephone Encounter (Signed)
Patients insurance no longer approves her Aciphex without a prior authorization and she is asking for an alternative and she is also asking for a refill on her phenergan, please advise

## 2013-03-17 NOTE — Telephone Encounter (Signed)
Pt has tried and failed: nexium, prilosec 40mg  and protonix in the past. Do you want to change medication or I can do PA for aciphex. Pt is requesting an alternative.   Pt also requesting phenergan.

## 2013-03-18 ENCOUNTER — Ambulatory Visit: Payer: Medicare Other | Admitting: Family Medicine

## 2013-03-18 MED ORDER — DEXLANSOPRAZOLE 60 MG PO CPDR: 60.0000 mg | DELAYED_RELEASE_CAPSULE | Freq: Every day | ORAL | Status: DC

## 2013-03-18 MED ORDER — PROMETHAZINE HCL 12.5 MG PO TABS
12.5000 mg | ORAL_TABLET | Freq: Four times a day (QID) | ORAL | Status: DC | PRN
Start: 2013-03-18 — End: 2013-07-10

## 2013-03-18 NOTE — Telephone Encounter (Signed)
Patient aware.

## 2013-03-18 NOTE — Telephone Encounter (Signed)
rx for dexilant and phenergan done.

## 2013-03-18 NOTE — Addendum Note (Signed)
Addended by: Mahala Menghini on: 03/18/2013 08:27 AM   Modules accepted: Orders, Medications

## 2013-03-18 NOTE — Telephone Encounter (Signed)
Called patient and left message for them to return call at the office   

## 2013-03-24 ENCOUNTER — Telehealth: Payer: Self-pay | Admitting: Family Medicine

## 2013-03-24 NOTE — Telephone Encounter (Signed)
Patient states for 3 days she has been having dysuria and frequency and a discomfort in her lower abdomen. Wanted something called in for UTI. I advised she would have to come to the office to leave a urine specimen. She will come in the am.

## 2013-03-31 ENCOUNTER — Telehealth: Payer: Self-pay | Admitting: *Deleted

## 2013-03-31 NOTE — Telephone Encounter (Signed)
Pt called about getting a pre.artho. Pt states her medication is not in that plan, pt uses [facility]. Please advise [phone]

## 2013-04-02 ENCOUNTER — Other Ambulatory Visit: Payer: Self-pay | Admitting: Family Medicine

## 2013-04-03 ENCOUNTER — Telehealth: Payer: Self-pay | Admitting: Orthopedic Surgery

## 2013-04-03 NOTE — Telephone Encounter (Signed)
I called [facility], the only thing we have called in recently that would need a PA has been dexilant, her insurance covers dexilant and it does not require a PA. They dont know of anything else but will fax [name] if something comes in.

## 2013-04-03 NOTE — Telephone Encounter (Signed)
04/02/13 Patient called to inquire about follow up/plan of care for her left shoulder and left arm, most recently seen here on 02/18/13. Patient states that, at her most recent visit with Dr. Sherwood Gambler, that he did not feel that surgery would benefit her.  She is asking about possible surgery by Dr. Aline Brochure - which her visit notes of 02/18/13 state that Dr. Aline Brochure does not feel that surgery is needed for her shoulder, and he had referred her back to Dr Sherwood Gambler after this evaluation 02/18/13.  Telephone note of 03/11/13 indicate that Dr. Sherwood Gambler may be referring patient for another orthopaedic opinion.    We have requested a copy of Dr. Donnella Bi consult note; patient aware.    Patient's ph# is [phone].

## 2013-04-04 ENCOUNTER — Other Ambulatory Visit: Payer: Self-pay | Admitting: Gastroenterology

## 2013-04-08 NOTE — Telephone Encounter (Signed)
Noted  

## 2013-04-09 NOTE — Telephone Encounter (Signed)
04/09/13 called [facility] to follow up on request faxed 04/03/13 for any additional office note; per medical records, no other visit note since the October consult, which we have received.  Left message for Dr. Donnella Bi secretary, Glenard Haring, to call back with any other communication that they may have, that patient is referring to.

## 2013-04-11 LAB — COMPLETE METABOLIC PANEL WITH GFR
ALT: 18 U/L (ref 0–35)
AST: 16 U/L (ref 0–37)
Albumin: 3.9 g/dL (ref 3.5–5.2)
Alkaline Phosphatase: 163 U/L — ABNORMAL HIGH (ref 39–117)
BUN: 19 mg/dL (ref 6–23)
CO2: 26 mEq/L (ref 19–32)
Calcium: 9.9 mg/dL (ref 8.4–10.5)
Chloride: 108 mEq/L (ref 96–112)
Creat: 0.84 mg/dL (ref 0.50–1.10)
GFR, Est African American: 86 mL/min
GFR, Est Non African American: 75 mL/min
Glucose, Bld: 264 mg/dL — ABNORMAL HIGH (ref 70–99)
Potassium: 4.5 mEq/L (ref 3.5–5.3)
Sodium: 142 mEq/L (ref 135–145)
Total Bilirubin: 0.3 mg/dL (ref 0.2–1.2)
Total Protein: 6.9 g/dL (ref 6.0–8.3)

## 2013-04-11 LAB — HEMOGLOBIN A1C
Hgb A1c MFr Bld: 7.2 % — ABNORMAL HIGH (ref <5.7)
Mean Plasma Glucose: 160 mg/dL — ABNORMAL HIGH (ref <117)

## 2013-04-14 NOTE — Telephone Encounter (Signed)
Received call back from [facility], Dr Donnella Bi secretary, who relays that patient has also been calling their office asking about having surgery "first by Dr Sherwood Gambler" so that she can then have surgery by Dr Aline Brochure.  Glenard Haring states, as per Dr Donnella Bi most recent note (copy scanned into chart) that Dr Sherwood Gambler does not recommend surgical intervention.  Dr. Ruthe Mannan last note indicates that he does not recommend surgery either.  Patient maintains that she wants something done with her arm and shoulder.  Please advise.  Patient's ph# [phone].

## 2013-04-14 NOTE — Telephone Encounter (Signed)
Routing to Dr Harrison 

## 2013-04-15 ENCOUNTER — Other Ambulatory Visit: Payer: Self-pay | Admitting: Family Medicine

## 2013-04-15 NOTE — Telephone Encounter (Signed)
I agree with Dr. Sherwood Gambler that the patient is not a surgical candidate. Please have her see her primary care physician for possible referral to chronic pain management

## 2013-04-15 NOTE — Telephone Encounter (Signed)
Patient advised of Dr. Ruthe Mannan reply. She advised that she is already in a pain management program.

## 2013-04-16 ENCOUNTER — Ambulatory Visit (INDEPENDENT_AMBULATORY_CARE_PROVIDER_SITE_OTHER): Payer: Medicare HMO | Admitting: Family Medicine

## 2013-04-16 ENCOUNTER — Encounter: Payer: Self-pay | Admitting: Family Medicine

## 2013-04-16 ENCOUNTER — Encounter (INDEPENDENT_AMBULATORY_CARE_PROVIDER_SITE_OTHER): Payer: Self-pay

## 2013-04-16 VITALS — BP 138/70 | HR 81 | Resp 16 | Ht 62.0 in | Wt 157.8 lb

## 2013-04-16 DIAGNOSIS — F319 Bipolar disorder, unspecified: Secondary | ICD-10-CM

## 2013-04-16 DIAGNOSIS — B370 Candidal stomatitis: Secondary | ICD-10-CM

## 2013-04-16 DIAGNOSIS — F17200 Nicotine dependence, unspecified, uncomplicated: Secondary | ICD-10-CM

## 2013-04-16 DIAGNOSIS — K3184 Gastroparesis: Secondary | ICD-10-CM

## 2013-04-16 DIAGNOSIS — G4733 Obstructive sleep apnea (adult) (pediatric): Secondary | ICD-10-CM

## 2013-04-16 DIAGNOSIS — M25512 Pain in left shoulder: Secondary | ICD-10-CM

## 2013-04-16 DIAGNOSIS — I69959 Hemiplegia and hemiparesis following unspecified cerebrovascular disease affecting unspecified side: Secondary | ICD-10-CM

## 2013-04-16 DIAGNOSIS — E1143 Type 2 diabetes mellitus with diabetic autonomic (poly)neuropathy: Secondary | ICD-10-CM

## 2013-04-16 DIAGNOSIS — IMO0002 Reserved for concepts with insufficient information to code with codable children: Secondary | ICD-10-CM

## 2013-04-16 DIAGNOSIS — M25519 Pain in unspecified shoulder: Secondary | ICD-10-CM

## 2013-04-16 DIAGNOSIS — G40909 Epilepsy, unspecified, not intractable, without status epilepticus: Secondary | ICD-10-CM

## 2013-04-16 DIAGNOSIS — E785 Hyperlipidemia, unspecified: Secondary | ICD-10-CM

## 2013-04-16 DIAGNOSIS — E1149 Type 2 diabetes mellitus with other diabetic neurological complication: Secondary | ICD-10-CM

## 2013-04-16 DIAGNOSIS — I10 Essential (primary) hypertension: Secondary | ICD-10-CM

## 2013-04-16 MED ORDER — FIRST-DUKES MOUTHWASH MT SUSP: OROMUCOSAL | Status: DC

## 2013-04-16 MED ORDER — FLUTICASONE PROPIONATE 50 MCG/ACT NA SUSP: 1.0000 | Freq: Every day | NASAL | Status: DC

## 2013-04-16 NOTE — Patient Instructions (Addendum)
Annual wellness in 33month, call if you need me before  Fasting lipid, cmp and EGFr, and HBa1C and CBc in 4.5 month  I am happy that you are content with neurologist  [facility] mouthwash  sent in for dry mouth and white lips  Blood pressure and diabetes are good  Please continue to work on smoking cessation  We will ask the supplier to contact Dr YSandrea Hughsre need for updated  sleep study  Nicotine Addiction Nicotine can act as both a stimulant (excites/activates) and a sedative (calms/quiets). Immediately after exposure to nicotine, there is a "kick" caused in part by the drug's stimulation of the adrenal glands and resulting discharge of adrenaline (epinephrine). The rush of adrenaline stimulates the body and causes a sudden release of sugar. This means that smokers are always slightly hyperglycemic. Hyperglycemic means that the blood sugar is high, just like in diabetics. Nicotine also decreases the amount of insulin which helps control sugar levels in the body. There is an increase in blood pressure, breathing, and the rate of heart beats.  In addition, nicotine indirectly causes a release of dopamine in the brain that controls pleasure and motivation. A similar reaction is seen with other drugs of abuse, such as cocaine and heroin. This dopamine release is thought to cause the pleasurable sensations when smoking. In some different cases, nicotine can also create a calming effect, depending on sensitivity of the smoker's nervous system and the dose of nicotine taken. WHAT HAPPENS WHEN NICOTINE IS TAKEN FOR LONG PERIODS OF TIME?  Long-term use of nicotine results in addiction. It is difficult to stop.  Repeated use of nicotine creates tolerance. Higher doses of nicotine are needed to get the "kick." When nicotine use is stopped, withdrawal may last a month or more. Withdrawal may begin within a few hours after the last cigarette. Symptoms peak within the first few days and may lessen within a  few weeks. For some people, however, symptoms may last for months or longer. Withdrawal symptoms include:   Irritability.  Craving.  Learning and attention deficits.  Sleep disturbances.  Increased appetite. Craving for tobacco may last for 6 months or longer. Many behaviors done while using nicotine can also play a part in the severity of withdrawal symptoms. For some people, the feel, smell, and sight of a cigarette and the ritual of obtaining, handling, lighting, and smoking the cigarette are closely linked with the pleasure of smoking. When stopped, they also miss the related behaviors which make the withdrawal or craving worse. While nicotine gum and patches may lessen the drug aspects of withdrawal, cravings often persist. WHAT ARE THE MEDICAL CONSEQUENCES OF NICOTINE USE?  Nicotine addiction accounts for one-third of all cancers. The top cancer caused by tobacco is lung cancer. Lung cancer is the number one cancer killer of both men and women.  Smoking is also associated with cancers of the:  Mouth.  Pharynx.  Larynx.  Esophagus.  Stomach.  Pancreas.  Cervix.  Kidney.  Ureter.  Bladder.  Smoking also causes lung diseases such as lasting (chronic) bronchitis and emphysema.  It worsens asthma in adults and children.  Smoking increases the risk of heart disease, including:  Stroke.  Heart attack.  Vascular disease.  Aneurysm.  Passive or secondary smoke can also increase medical risks including:  Asthma in children.  Sudden Infant Death Syndrome (SIDS).  Additionally, dropped cigarettes are the leading cause of residential fire fatalities.  Nicotine poisoning has been reported from accidental ingestion of tobacco  products by children and pets. Death usually results in a few minutes from respiratory failure (when a person stops breathing) caused by paralysis. TREATMENT   Medication. Nicotine replacement medicines such as nicotine gum and the patch are  used to stop smoking. These medicines gradually lower the dosage of nicotine in the body. These medicines do not contain the carbon monoxide and other toxins found in tobacco smoke.  Hypnotherapy.  Relaxation therapy.  Nicotine Anonymous (a 12-step support program). Find times and locations in your local yellow pages. Document Released: 11/03/2003 Document Revised: 05/22/2011 Document Reviewed: 03/27/2007 [facility] Patient Information 2014 [place].

## 2013-04-19 NOTE — Assessment & Plan Note (Signed)
Unchnaged, on chronic pain management, both her ortho and neuro surgeons see no indication for surgery at this time

## 2013-04-19 NOTE — Assessment & Plan Note (Signed)
Controlled, no change in medication Patient advised to reduce carb and sweets, commit to regular physical activity, take meds as prescribed, test blood as directed, and attempt to lose weight, to improve blood sugar control.  

## 2013-04-19 NOTE — Progress Notes (Signed)
Subjective:    Patient ID: [name], female    DOB: 01/14/1951, 63 y.o.   MRN: [id number]  HPI The PT is here for follow up and re-evaluation of chronic medical conditions, medication management and review of any available recent lab and radiology data.  Preventive health is updated, specifically  Cancer screening and Immunization.   Questions or concerns regarding consultations or procedures which the PT has had in the interim are  Addressed.She has seen neurosurgery re c spine disease, neurologist, re chronic headaches and seizure d/s and orthopedics re left shoulder pain,ince her last visit. Has upcoming eye exam The PT denies any adverse reactions to current medications since the last visit.  C/o white coating of tongue and dry white lips and  mouth, wants medication to help Tests blood sugars daily, denies polyuria, polydipsia or hypoglycemic episodes      Review of Systems See HPI Denies recent fever or chills. Denies sinus pressure, nasal congestion, ear pain or sore throat. Denies chest congestion, productive cough or wheezing. Denies chest pains, palpitations and leg swelling Denies abdominal pain, nausea, vomiting,diarrhea or constipation.   Denies dysuria, frequency, hesitancy or incontinence. Chronic joint pain,  and limitation in mobility.Unchnaged Denies headaches, seizures, numbness, or tingling. Denies  Uncontrolled  depression, anxiety or insomnia. Denies skin break down or rash.        Objective:   Physical Exam BP 138/70  Pulse 81  Resp 16  Ht 5\' 2"  (1.575 m)  Wt 157 lb 12.8 oz (71.578 kg)  BMI 28.85 kg/m2  SpO2 100% Patient alert and oriented and in no cardiopulmonary distress.  HEENT: No facial asymmetry, EOMI, no sinus tenderness,  oropharynx pink and moist.  Neck decreased ROM no adenopathy.  Chest: Clear to auscultation bilaterally.  CVS: S1, S2 no murmurs, no S3.  ABD: Soft non tender. Bowel sounds normal.  Ext: No edema  MS:  Decreased ROM spine, shoulders, hips and knees.  Skin: Intact, no ulcerations or rash noted.  Psych: Good eye contact, normal affect. Memory intact not anxious or depressed appearing.  CNS: CN 2-12 intact, power,  normal throughout.        Assessment & Plan:  Oral thrush Recurrent probleam also pt has dry mouth syndrome, medication not covered however, was on oral wash in the past, will try [facility] initially  Well controlled type 2 diabetes mellitus with gastroparesis Controlled, no change in medication Patient advised to reduce carb and sweets, commit to regular physical activity, take meds as prescribed, test blood as directed, and attempt to lose weight, to improve blood sugar control.   HYPERTENSION Controlled, no change in medication DASH diet and commitment to daily physical activity for a minimum of 30 minutes discussed and encouraged, as a part of hypertension management. The importance of attaining a healthy weight is also discussed.   Obstructive sleep apnea Reports that hse needs an updated sleep study to continue getting supplies/ needs new equipment, he pharmacy needs to contact supervising Md  Seizure disorder Controlled, no change in medication Lamictal 150mg  daily  Hemiplegia affecting non-dominant side, post-stroke Recently evaliuated by neurology, plavix discontinued, current is asa 81mg  daily  Bipolar disorder Stable and managed by psych  HYPERLIPIDEMIA Hyperlipidemia:Low fat diet discussed and encouraged.  Updated lab needed at/ before next visit.   Nicotine dependence Trying to quit, essentially unchanged since last visit Patient counseled for approximately 5 minutes regarding the health risks of ongoing nicotine use, specifically all types of cancer, heart disease, stroke  and respiratory failure. The options available for help with cessation ,the behavioral changes to assist the process, and the option to either gradully reduce usage  Or abruptly  stop.is also discussed. Pt is also encouraged to set specific goals in number of cigarettes used daily, as well as to set a quit date.   Pain, joint, shoulder region, left Unchnaged, on chronic pain management, both her ortho and neuro surgeons see no indication for surgery at this time

## 2013-04-19 NOTE — Assessment & Plan Note (Signed)
Controlled, no change in medication Lamictal 150mg  daily

## 2013-04-19 NOTE — Assessment & Plan Note (Signed)
Controlled, no change in medication DASH diet and commitment to daily physical activity for a minimum of 30 minutes discussed and encouraged, as a part of hypertension management. The importance of attaining a healthy weight is also discussed.  

## 2013-04-19 NOTE — Assessment & Plan Note (Signed)
Reports that hse needs an updated sleep study to continue getting supplies/ needs new equipment, he pharmacy needs to contact supervising Md

## 2013-04-19 NOTE — Assessment & Plan Note (Signed)
Recently evaliuated by neurology, plavix discontinued, current is asa 81mg  daily

## 2013-04-19 NOTE — Assessment & Plan Note (Signed)
Hyperlipidemia:Low fat diet discussed and encouraged.  Updated lab needed at/ before next visit.  

## 2013-04-19 NOTE — Assessment & Plan Note (Signed)
Recurrent probleam also pt has dry mouth syndrome, medication not covered however, was on oral wash in the past, will try [facility] initially

## 2013-04-19 NOTE — Assessment & Plan Note (Signed)
Trying to quit, essentially unchanged since last visit Patient counseled for approximately 5 minutes regarding the health risks of ongoing nicotine use, specifically all types of cancer, heart disease, stroke and respiratory failure. The options available for help with cessation ,the behavioral changes to assist the process, and the option to either gradully reduce usage  Or abruptly stop.is also discussed. Pt is also encouraged to set specific goals in number of cigarettes used daily, as well as to set a quit date.

## 2013-04-19 NOTE — Assessment & Plan Note (Signed)
Stable and managed by psych 

## 2013-04-21 ENCOUNTER — Telehealth: Payer: Self-pay

## 2013-04-21 ENCOUNTER — Other Ambulatory Visit: Payer: Self-pay

## 2013-04-21 NOTE — Telephone Encounter (Signed)
Tradjenta not covered. Wants you to change to another med. Alternatives are Januvia and Onglyza (states these may also need a PA) [phone]

## 2013-04-24 ENCOUNTER — Ambulatory Visit: Payer: Medicare Other | Admitting: Gastroenterology

## 2013-04-28 ENCOUNTER — Other Ambulatory Visit: Payer: Self-pay | Admitting: Internal Medicine

## 2013-04-28 ENCOUNTER — Encounter: Payer: Self-pay | Admitting: Gastroenterology

## 2013-04-28 ENCOUNTER — Encounter (HOSPITAL_COMMUNITY): Payer: Self-pay | Admitting: Pharmacy Technician

## 2013-04-28 ENCOUNTER — Ambulatory Visit (INDEPENDENT_AMBULATORY_CARE_PROVIDER_SITE_OTHER): Payer: Medicare HMO | Admitting: Gastroenterology

## 2013-04-28 VITALS — BP 110/67 | HR 84 | Temp 97.8°F | Wt 158.2 lb

## 2013-04-28 DIAGNOSIS — K3184 Gastroparesis: Secondary | ICD-10-CM | POA: Insufficient documentation

## 2013-04-28 DIAGNOSIS — K5900 Constipation, unspecified: Secondary | ICD-10-CM

## 2013-04-28 DIAGNOSIS — R109 Unspecified abdominal pain: Secondary | ICD-10-CM | POA: Insufficient documentation

## 2013-04-28 MED ORDER — ONDANSETRON HCL 4 MG PO TABS: 4.0000 mg | ORAL_TABLET | Freq: Three times a day (TID) | ORAL | Status: DC

## 2013-04-28 NOTE — Patient Instructions (Addendum)
Make sure you take Linzess 1 capsule each morning, 30 minutes before breakfast.   Start taking Zofran one tablet with each meal. This is for nausea. Limit the Phenergan to only as needed.   We have scheduled you for a test to see if you have an overgrowth of bacteria in your intestines.   We will see you back in 4 weeks.

## 2013-04-28 NOTE — Assessment & Plan Note (Signed)
CTA negative for mesenteric ischemia. Chronic abdominal pain. Multifactorial. With multiple complaints of bloating, persistent discomfort, proceed with hydrogen breath test to wrap up GI evaluation.

## 2013-04-28 NOTE — Progress Notes (Signed)
Referring Provider: [name], MD Primary Care Physician:  Tula Nakayama, MD  Chief Complaint  Patient presents with  . Follow-up    HPI:   [name] presents today for routine 3 month follow-up; history significant for chronic abdominal pain (CTA negative for mesenteric ischemia), constipation, gastroparesis, and heme positive stool 2014 with slight drift in ferritin and chronic anemia. Early TCS/EGD/ED performed July 2014 with overall unimpressive findings. 56-F empiric dilation. Capsule study undergone to complete GI work-up for anemia. Capsule study normal.   Alternating constipation and diarrhea. Having dry heaves. Feels nauseated. Soft foods are tolerated. Can't tolerate grease. No dysphagia. Complains of abdominal bloating. Eating causes discomfort at times.   Linzess 290 mcg: ran out. Last had on Saturday. States she will be constipated, then once gets the "bowels to pass, it starts into diarrhea".   Past Medical History  Diagnosis Date  . Bipolar disorder   . CVA (cerebral infarction)   . Pancreatitis 2006    due to Depakote with normal EUS   . Osteoporosis   . Chronic back pain   . Diabetes mellitus   . Trigger finger   . Anxiety disorder   . Hypertension   . Migraines   . Diverticulosis     TCS 9/08 by Dr. Delfin Edis for diarrhea . Bx for micro scopic colitis negative.   . Schatzki's ring     non critical / EGD with ED 8/2011with RMR  . S/P colonoscopy 6073,7106, 2011    left-sided diverticula, hx of simple adenomas . 2011, random bx negative for microscopic colitis  . Glaucoma   . Allergic rhinitis   . Hypothyroidism     thyroid goiter  . Anemia   . Blood transfusion   . GERD (gastroesophageal reflux disease)   . Stroke     left sided weakness  . Seizures     unknown etiology-on meds-last seizure was 3 yerars ago  . Anxiety   . Depression   . Metabolic encephalopathy 2/69/4854  . Sleep apnea   . Arthritis   . Gum symptoms    infection on antibiotic    Past Surgical History  Procedure Laterality Date  . Abdominal hysterectomy    . Cholecystectomy    . Ovarian cyst removal    . Carpal tunnel release  07/22/04    left/ Dr. Aline Brochure   . Breast reduction surgery    . Bilateral cataract surgery    . Biopsy of thyroid gland  2009  . Surgical excision of 3 tumors from right thigh and right buttock  and left upper thigh  2010  . Back surgery  July 2012  . Spine surgery  09/29/2010    dr brooks  . Maloney dilation  12/29/2010    RMR;  . Esophagogastroduodenoscopy  12/29/2010    Rourk-Retained food in the esophagus and stomach, small hiatal hernia, status post Maloney dilation of the esophagus  . Craniotomy  11/23/2011    Procedure: CRANIOTOMY TUMOR EXCISION;  Surgeon: Hosie Spangle, MD;  Location: [facility];  Service: Neurosurgery;  Laterality: N/A;  Craniotomy for tumor resection  . Brain surgery  11/2011    resection of meningioma  . Colonoscopy N/A 09/25/2012    OEV:[id number] diverticulosis.  colonic polyp-removed : tubular adenoma  . Esophagogastroduodenoscopy N/A 09/25/2012    GHW:[id number] atonic baggy esophagus status post Maloney dilation 47 F. Hiatal hernia  . Givens capsule study N/A 01/15/2013    NORMAL.  Current Outpatient Prescriptions  Medication Sig Dispense Refill  . aspirin 81 MG tablet Take 81 mg by mouth daily.      . clobetasol (TEMOVATE) 0.05 % external solution Apply 1 application topically 2 (two) times daily.       . cloNIDine (CATAPRES) 0.3 MG tablet One tablet every 8 hours, take at 8am, 4pm and 12 midnight  90 tablet  5  . dexlansoprazole (DEXILANT) 60 MG capsule Take 1 capsule (60 mg total) by mouth daily.  31 capsule  2  . diclofenac sodium (VOLTAREN) 1 % GEL Apply 2 g topically daily as needed (Pain).      [name] diphenoxylate-atropine (LOMOTIL) 2.5-0.025 MG per tablet Take 1 tablet by mouth 2 (two) times daily as needed for diarrhea or loose stools.  30 tablet  0  . fluticasone  (FLONASE) 50 MCG/ACT nasal spray Place 1 spray into both nostrils daily. Allergies  16 g  3  . glipiZIDE (GLUCOTROL XL) 2.5 MG 24 hr tablet Take 2.5 mg by mouth daily with breakfast.      . hydrochlorothiazide (MICROZIDE) 12.5 MG capsule Take 12.5 mg by mouth daily.      . hydroxychloroquine (PLAQUENIL) 200 MG tablet Take 200 mg by mouth daily.      . hydrOXYzine (ATARAX/VISTARIL) 10 MG tablet Take 10 mg by mouth 3 (three) times daily as needed for itching.       . lamoTRIgine (LAMICTAL) 100 MG tablet Take 150 mg by mouth every morning.       [name] levothyroxine (LEVOXYL) 50 MCG tablet Take 50 mcg by mouth every morning.       . linagliptin (TRADJENTA) 5 MG TABS tablet Take 1 tablet (5 mg total) by mouth daily.  30 tablet  5  . LINZESS 290 MCG CAPS capsule TAKE 1 CAPSULE BY MOUTH ONCE DAILY 30 MINUTES BEFORE BREAKFAST.  30 capsule  11  . LORazepam (ATIVAN) 1 MG tablet Take 1 mg by mouth 4 (four) times daily.       [name] losartan (COZAAR) 100 MG tablet Take 1 tablet (100 mg total) by mouth daily.  30 tablet  11  . meclizine (ANTIVERT) 12.5 MG tablet Take 12.5 mg by mouth 3 (three) times daily as needed.      . methocarbamol (ROBAXIN) 500 MG tablet Take 500 mg by mouth daily.       . metoprolol (LOPRESSOR) 50 MG tablet Take 1 tablet (50 mg total) by mouth 2 (two) times daily.  60 tablet  5  . mirtazapine (REMERON) 30 MG tablet Take 30 mg by mouth at bedtime.       [name] nystatin (MYCOSTATIN) powder Apply topically 4 (four) times daily.      [name] ofloxacin (OCUFLOX) 0.3 % ophthalmic solution Place 1 drop into both eyes daily.      [name] oxyCODONE-acetaminophen (PERCOCET) 10-325 MG per tablet Take 1 tablet by mouth 3 (three) times daily as needed for pain.       . pregabalin (LYRICA) 75 MG capsule Take 75 mg by mouth at bedtime.       . RABEprazole (ACIPHEX) 20 MG tablet Take 1 tablet (20 mg total) by mouth 2 (two) times daily.  60 tablet  11  . RESTASIS 0.05 % ophthalmic emulsion Place 1 drop into both eyes 2 (two)  times daily.      . rosuvastatin (CRESTOR) 5 MG tablet Take 1 tablet (5 mg total) by mouth at bedtime.  30 tablet  11  . sertraline (  ZOLOFT) 100 MG tablet Take 150 mg by mouth at bedtime.       [name] thioridazine (MELLARIL) 10 MG tablet Take 20 mg by mouth at bedtime.       . traZODone (DESYREL) 50 MG tablet Take 50 mg by mouth at bedtime.       . Vitamin D, Ergocalciferol, (DRISDOL) 50000 UNITS CAPS capsule TAKE 1 CAPSULE BY MOUTH ONCE WEEKLY.  4 capsule  2  . Diphenhyd-Hydrocort-Nystatin (FIRST-DUKES MOUTHWASH) SUSP 10 cc three times daily, gargle , swish and swallow  For 10 days  300 mL  0  . montelukast (SINGULAIR) 10 MG tablet TAKE ONE TABLET DAILY.  30 tablet  2  . promethazine (PHENERGAN) 12.5 MG tablet Take 1 tablet (12.5 mg total) by mouth every 6 (six) hours as needed. FOR NAUSEA AND VOMITING  30 tablet  0   No current facility-administered medications for this visit.    Allergies as of 04/28/2013 - Review Complete 04/28/2013  Allergen Reaction Noted  . Cephalexin Hives   . Iron Nausea And Vomiting 11/15/2012  . Milk-related compounds Other (See Comments) 07/29/2008  . Penicillins Hives   . Phenazopyridine hcl Other (See Comments) 06/19/2007    Family History  Problem Relation Age of Onset  . Heart attack Mother   . Pneumonia Father   . Kidney failure Father   . Cancer Sister     pancreatic  . Diabetes Brother   . Hypertension Brother   . Colon cancer Neg Hx   . Anesthesia problems Neg Hx   . Hypotension Neg Hx   . Malignant hyperthermia Neg Hx   . Pseudochol deficiency Neg Hx     History   Social History  . Marital Status: Divorced    Spouse Name: N/A    Number of Children: 1  . Years of Education: N/A   Occupational History  . disabled     Social History Main Topics  . Smoking status: Current Some Day Smoker -- 0.25 packs/day for 7 years    Types: Cigarettes    Last Attempt to Quit: 09/22/2012  . Smokeless tobacco: Never Used     Comment: started at 24  smoked 6 yrs and stopped. restarted 2 months ago  . Alcohol Use: No     Comment: 5 cigs/day   . Drug Use: No  . Sexual Activity: No   Other Topics Concern  . None   Social History Narrative  . None    Review of Systems: As mentioned in HPI.   Physical Exam: BP 110/67  Pulse 84  Temp(Src) 97.8 F (36.6 C) (Oral)  Wt 158 lb 3.2 oz (71.759 kg) General:   Alert and oriented. No distress noted. Pleasant and cooperative.  Head:  Normocephalic and atraumatic. Eyes:  Conjuctiva clear without scleral icterus. Mouth:  Oral mucosa pink and moist. Good dentition. No lesions. Heart:  S1, S2 present without murmurs, rubs, or gallops. Regular rate and rhythm. Abdomen:  +BS, soft, non-tender and non-distended. No rebound or guarding. No HSM or masses noted. Msk:  Symmetrical without gross deformities. Normal posture. Extremities:  Without edema. Neurologic:  Alert and  oriented x4;  grossly normal neurologically. Skin:  Intact without significant lesions or rashes. Psych:  Alert and cooperative. Normal mood and affect.

## 2013-04-28 NOTE — Assessment & Plan Note (Signed)
Needs to take Linzess 290 mcg daily. Discussed this with patient. Return in 4 weeks. Colonoscopy up-to-date.

## 2013-04-28 NOTE — Assessment & Plan Note (Signed)
Weight stable. Reports underlying nausea, occasional vomiting. No concerning signs. Add Zofran TID with meals. Limit Phenergan.

## 2013-04-30 ENCOUNTER — Telehealth: Payer: Self-pay | Admitting: Family Medicine

## 2013-04-30 NOTE — Telephone Encounter (Signed)
Tradjenta requires step therapy she is currently on glipizide 2.5 mg and tradjenta Explain to pt and increase to glipizide xl 5mg  one daily stop tradjenta  She may start the new change after  She finishes her tradjenta staying on current 1 tradjenta and one glipizide 2.5 mg tab  Does not have to waste tradjenta she al;ready has I have d/c tradjenta from med list, and ask that you send in new glipizide dose which you will need to enter and also notify the pharmacy, thanks

## 2013-04-30 NOTE — Progress Notes (Signed)
cc'd to pcp 

## 2013-05-02 ENCOUNTER — Other Ambulatory Visit: Payer: Self-pay

## 2013-05-02 NOTE — Telephone Encounter (Signed)
Med changed back to Tradjenta 5mg  daily

## 2013-05-02 NOTE — Telephone Encounter (Signed)
Noted, thanks!

## 2013-05-02 NOTE — Telephone Encounter (Signed)
[name] was pa'd and approved through insurance.  Do you want to just leave as is with current dose of Tradjenta.

## 2013-05-02 NOTE — Telephone Encounter (Signed)
Yes please do, just please ALSO correct her current med list , thanks (if i had changed it, I can't remember , so pls check for me , thanks!

## 2013-05-05 ENCOUNTER — Encounter (HOSPITAL_COMMUNITY)
Admission: RE | Disposition: A | Discharge status: Home / Self Care | Payer: Self-pay | Source: Ambulatory Visit | Attending: Internal Medicine

## 2013-05-05 ENCOUNTER — Ambulatory Visit (HOSPITAL_COMMUNITY)
Admission: RE | Admit: 2013-05-05 | Discharge: 2013-05-05 | Disposition: A | Discharge status: Home / Self Care | Payer: Medicare HMO | Source: Ambulatory Visit | Attending: Internal Medicine | Admitting: Internal Medicine

## 2013-05-05 ENCOUNTER — Encounter (HOSPITAL_COMMUNITY): Payer: Self-pay

## 2013-05-05 DIAGNOSIS — R142 Eructation: Principal | ICD-10-CM | POA: Insufficient documentation

## 2013-05-05 DIAGNOSIS — R143 Flatulence: Principal | ICD-10-CM

## 2013-05-05 DIAGNOSIS — R141 Gas pain: Secondary | ICD-10-CM | POA: Insufficient documentation

## 2013-05-05 HISTORY — PX: BACTERIAL OVERGROWTH TEST: SHX5739

## 2013-05-05 SURGERY — BREATH TEST, FOR INTESTINAL BACTERIAL OVERGROWTH

## 2013-05-05 MED ORDER — LACTULOSE 10 GM/15ML PO SOLN
ORAL | Status: AC
  Filled 2013-05-05: qty 30

## 2013-05-05 NOTE — Discharge Instructions (Signed)
No beans, bran or high fiber cereal the day before the procedure? no °NPO except for water 12 hours before procedure? yes °No smoking, sleeping or vigorous exercising for at least 30 before procedure? no °Recent antibiotic use and/or diarrhea? no °  If yes, physician notified. ° °Time Baseline 15 mins 30 mins 45 mins 60 mins 75 mins 90 mins 105 mins 120 mins 135 mins 150 mins 165 mins 180 mins  °H2-ppm 0              ° °

## 2013-05-06 ENCOUNTER — Encounter (HOSPITAL_COMMUNITY): Payer: Self-pay | Admitting: Internal Medicine

## 2013-05-12 ENCOUNTER — Other Ambulatory Visit: Payer: Self-pay

## 2013-05-12 ENCOUNTER — Telehealth: Payer: Self-pay | Admitting: *Deleted

## 2013-05-12 DIAGNOSIS — Z1231 Encounter for screening mammogram for malignant neoplasm of breast: Secondary | ICD-10-CM

## 2013-05-12 IMAGING — CT CT HEAD W/O CM
1 series · 16 of 30 positions shown, 20 images · non-contrast
Comparison: 08/02/2011

CLINICAL DATA: Left leg weakness

CT HEAD WITHOUT CONTRAST
TECHNIQUE: Contiguous axial images were obtained from the base of
the skull through the vertex without contrast.

[Series 2: headseq 4.8 h37s · axial · 0.47mm/px · z∈[+1193,+1359]mm · 16 of 36 slices shown, 20 images]
[im 2/36  brain]
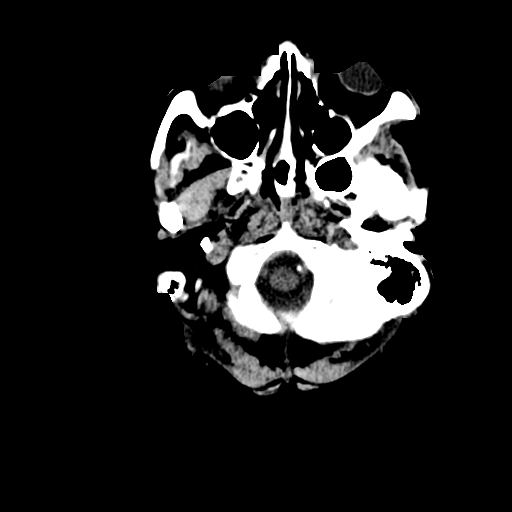
[im 2/36  bone]
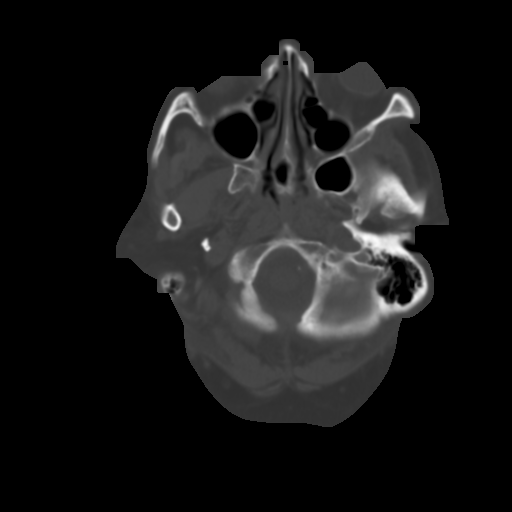
[im 4/36  brain]
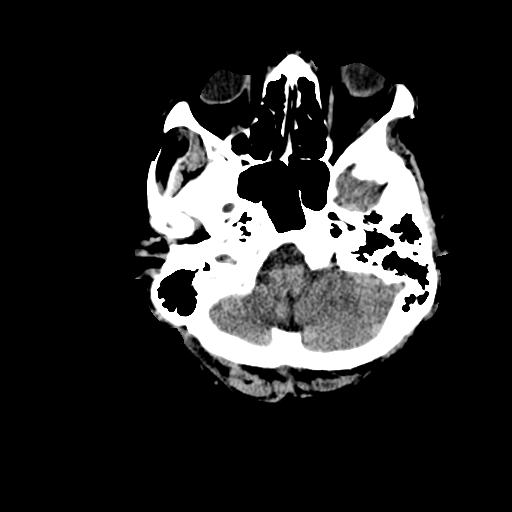
[im 7/36  brain]
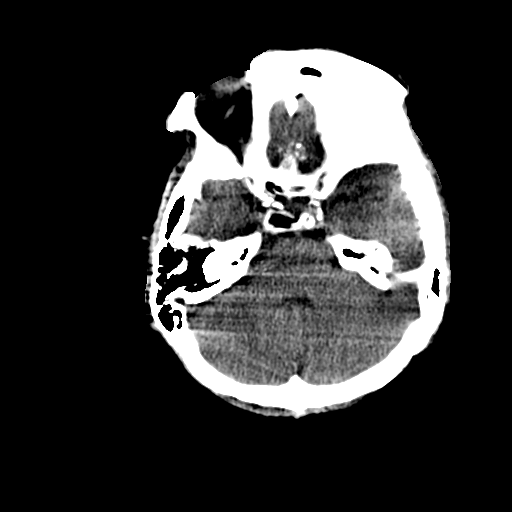
[im 9/36  brain]
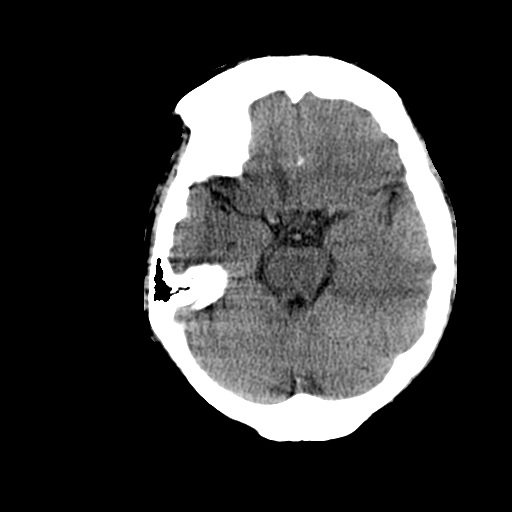
[im 10/36  brain]
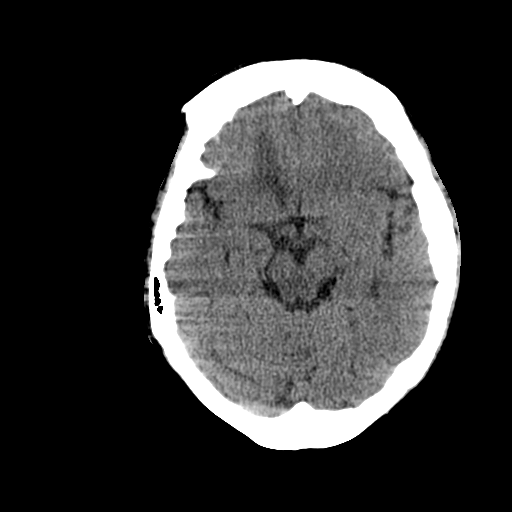
[im 10/36  bone]
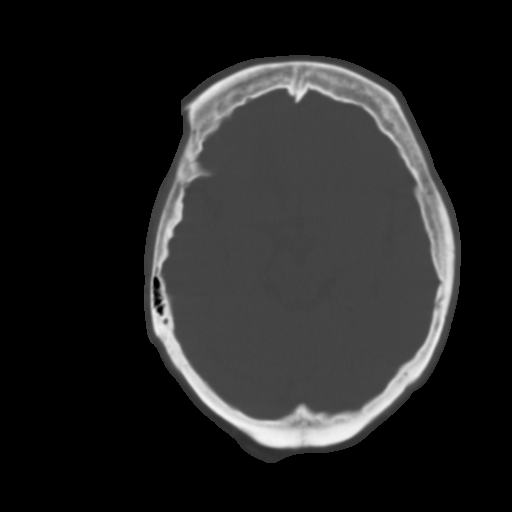
[im 13/36  brain]
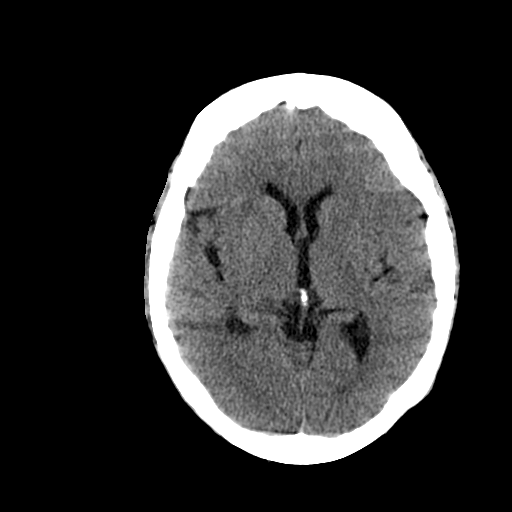
[im 15/36  brain]
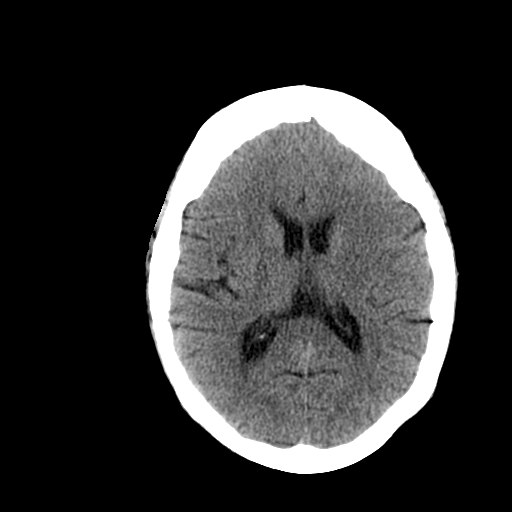
[im 17/36  brain]
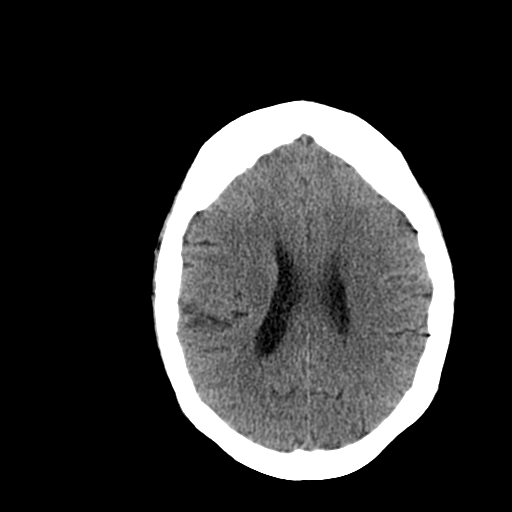
[im 19/36  brain]
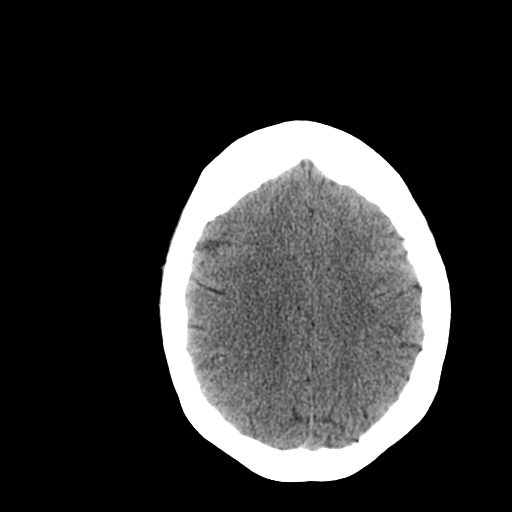
[im 19/36  bone]
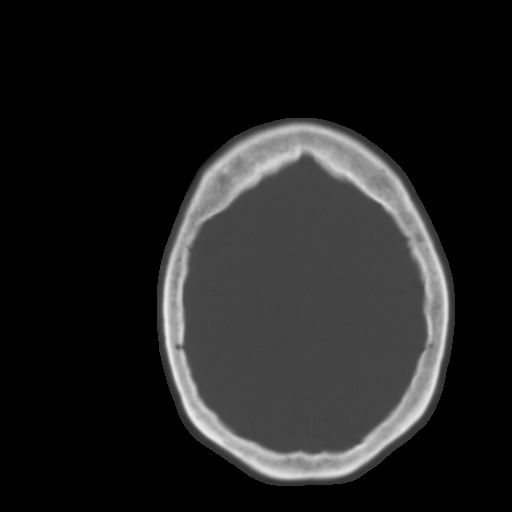
[im 21/36  brain]
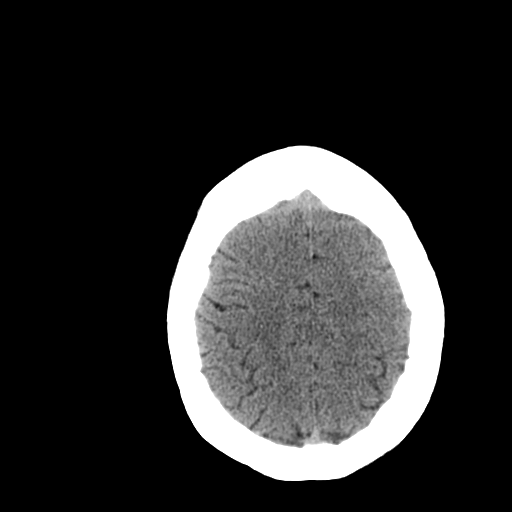
[im 23/36  brain]
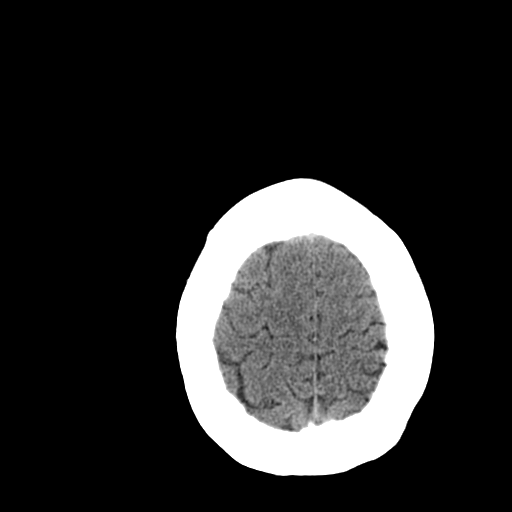
[im 26/36  brain]
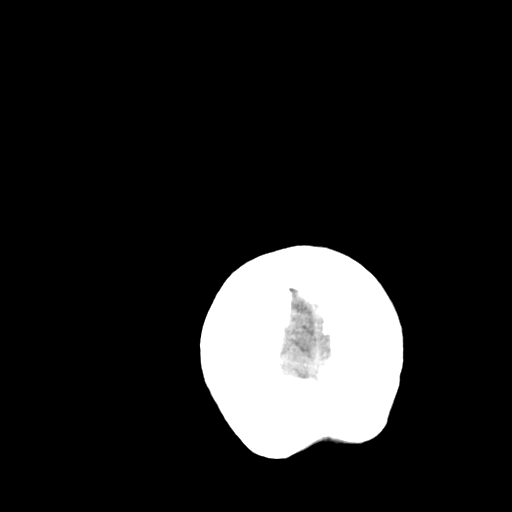
[im 27/36  brain]
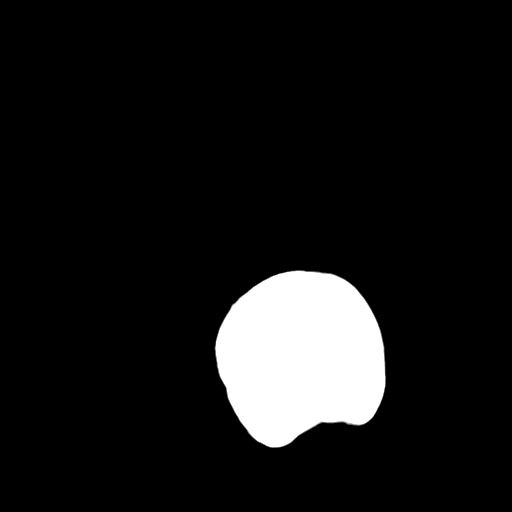
[im 27/36  bone]
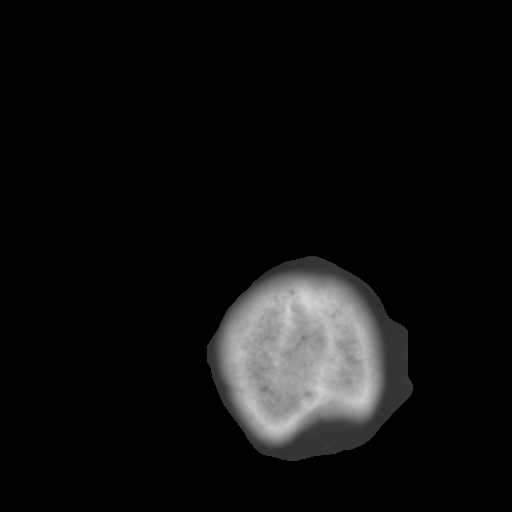
[im 29/36  brain]
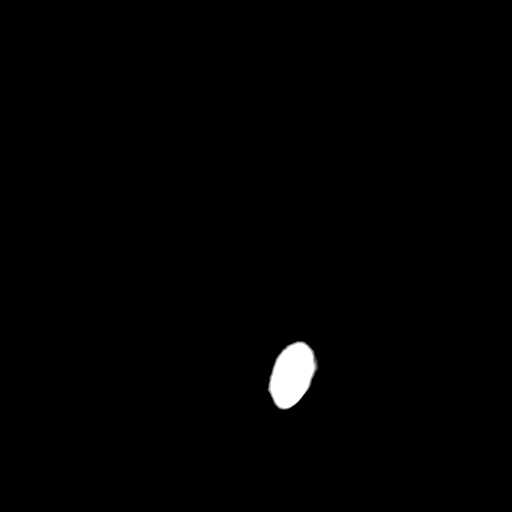
[im 32/36  brain]
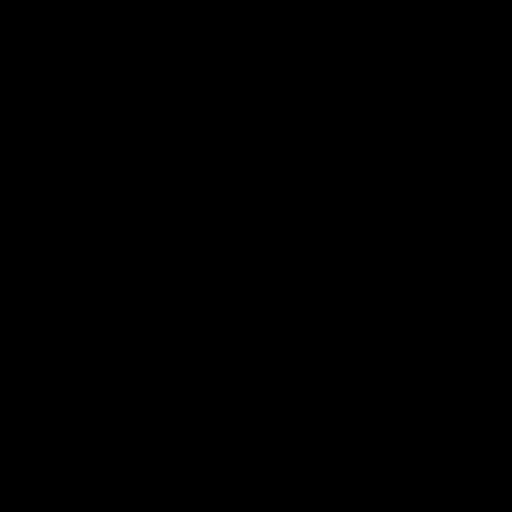
[im 34/36  brain]
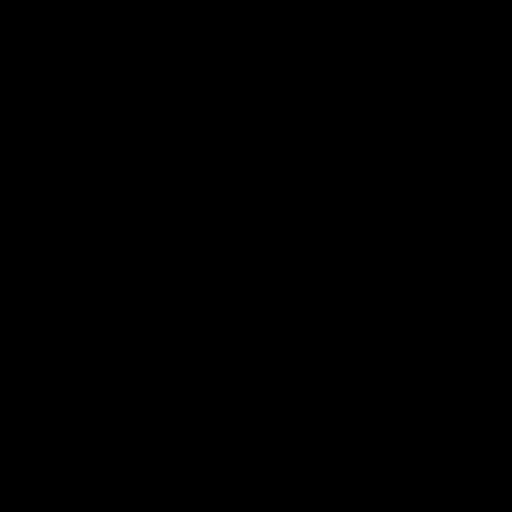

[16 of 30 positions shown; findings below may reference images not displayed]

FINDINGS: Periventricular white matter low density in the lower
right frontal lobe is stable compatible with chronic ischemic
changes.  Calcified anterior falcine meningioma is stable.  No new
low density areas to suggest acute ischemic change.  No new mass
effect, midline shift, or acute hemorrhage.  Mastoid air cells
clear.  Visualized paranasal sinuses are clear.
IMPRESSION: No acute intracranial pathology.  Chronic changes.

## 2013-05-12 IMAGING — MR MR HEAD WO/W CM
8 of 11 series · 30 of 48 positions shown · IV contrast (15ml Multihance)
Comparison: Brain MRI 01/13/2008.

CLINICAL DATA: 61-year-old female with left side numbness and
headache.  Chronic planum sphenoidale meningioma.

MRI HEAD WITHOUT AND WITH CONTRAST
TECHNIQUE: Multiplanar, multiecho pulse sequences of the brain and
surrounding structures were obtained according to standard protocol
without and with intravenous contrast
Contrast: 15mL MULTIHANCE GADOBENATE DIMEGLUMINE 529 MG/ML IV SOLN

[Series 3: DWI · axial · 5.0mm · 0.59mm/px · z∈[-56,+81]mm · 3 of 22 slices shown (1 of 2)]
[im 1/22]
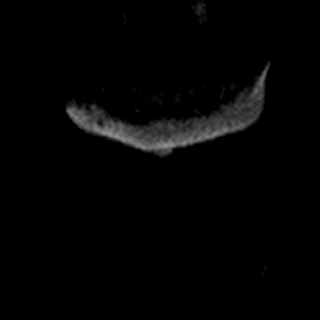
[im 11/22]
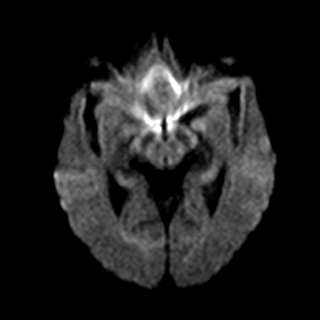
[im 22/22]
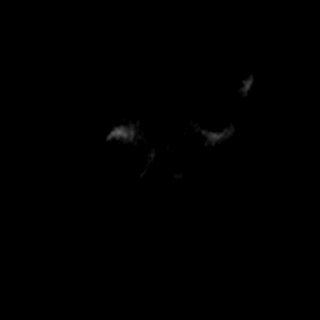

[Series 4: DWI · axial · 5.0mm · 0.64mm/px · z∈[-55,+81]mm · 3 of 22 slices shown (2 of 2)]
[im 1/22]
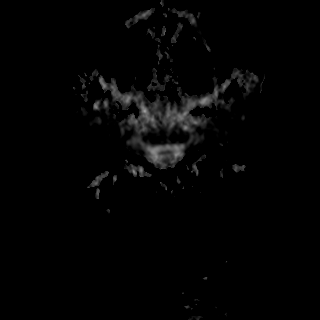
[im 11/22]
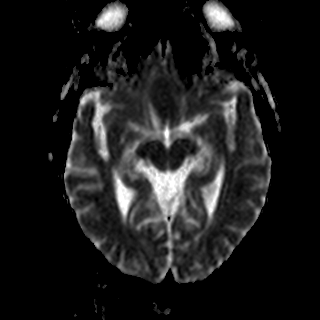
[im 22/22]
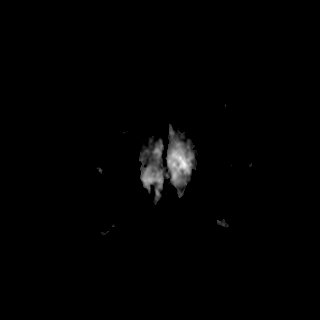

[Series 5: FLAIR · axial · 5.0mm · 0.60mm/px · z∈[-51,+87]mm · 3 of 24 slices shown]
[im 1/24]
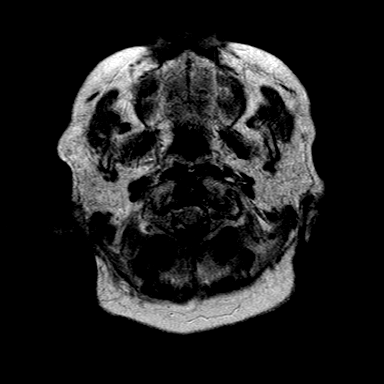
[im 12/24]
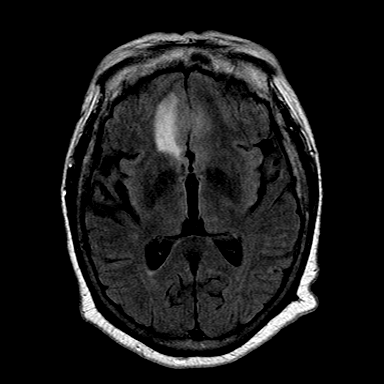
[im 24/24]
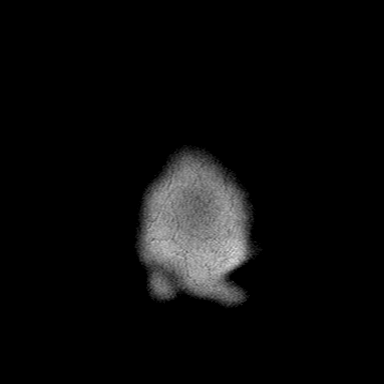

[Series 6: T2 · axial · 5.0mm · 0.57mm/px · z∈[-51,+87]mm · 3 of 24 slices shown (1 of 2)]
[im 1/24]
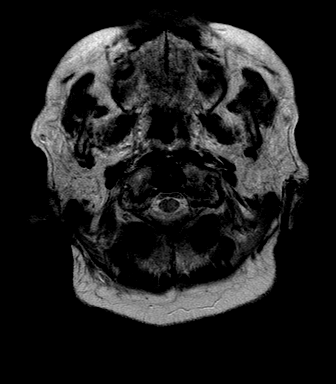
[im 12/24]
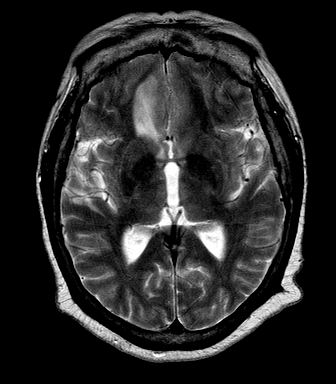
[im 24/24]
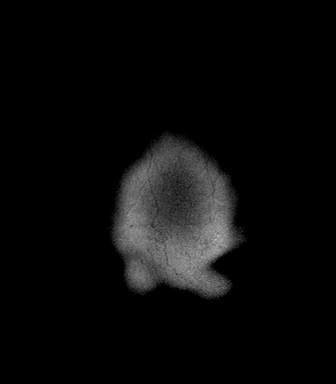

[Series 8: T2 · axial · 5.0mm · 0.43mm/px · z∈[-51,+15]mm · 2 of 24 slices shown (2 of 2)]
[im 1/24]
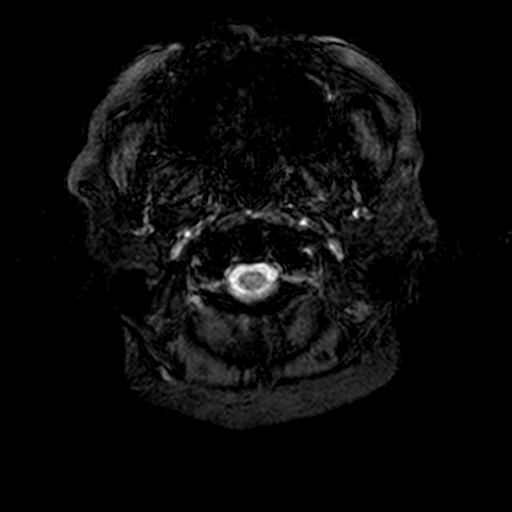
[im 12/24]
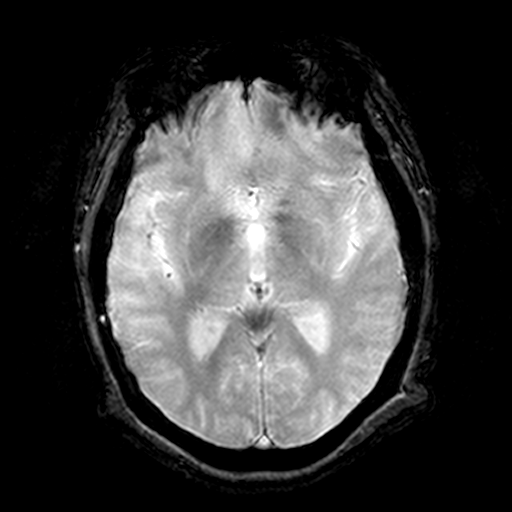

[Series 10: T1 post-contrast · axial · 2.0mm · 0.55mm/px · z∈[-61,+97]mm · 11 of 80 slices shown (1 of 3)]
[im 1/80]
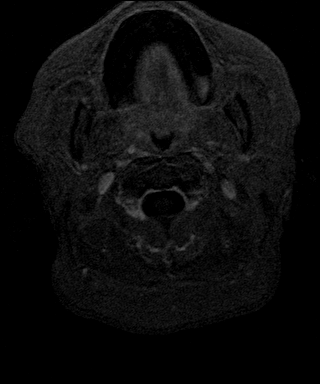
[im 8/80]
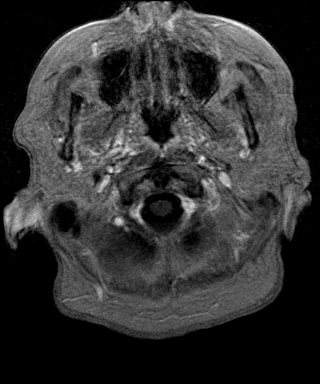
[im 16/80]
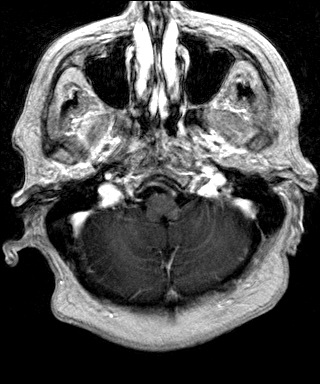
[im 24/80]
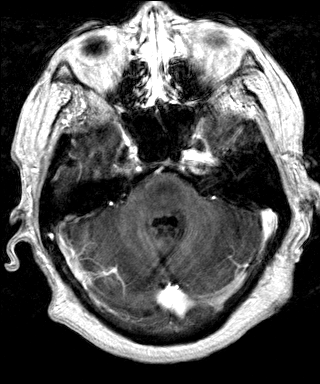
[im 32/80]
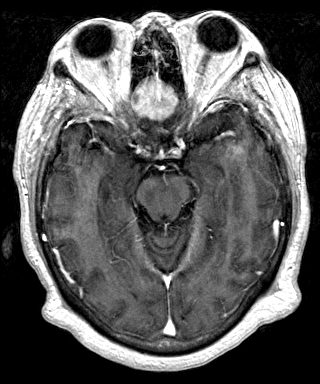
[im 40/80]
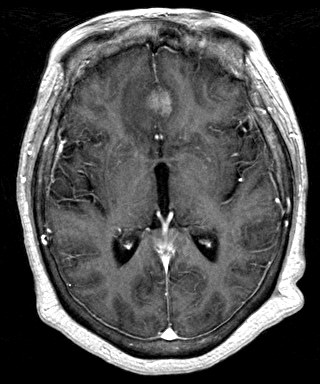
[im 48/80]
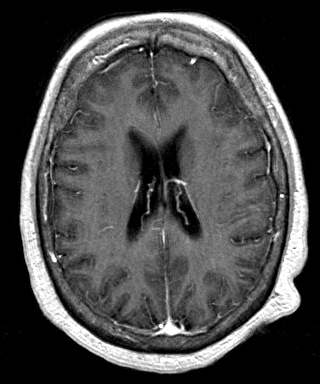
[im 56/80]
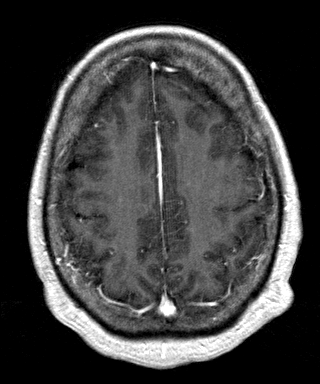
[im 64/80]
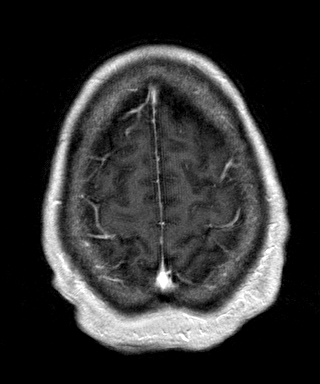
[im 72/80]
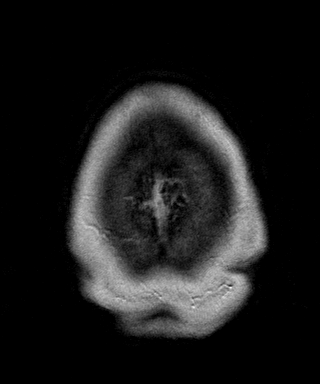
[im 80/80]
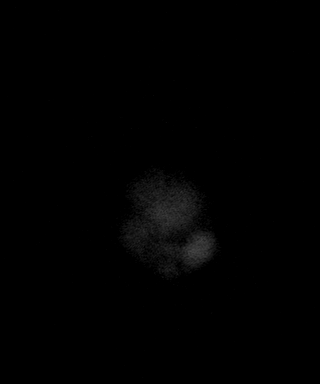

[Series 11: T1 post-contrast · coronal · 5.0mm · 0.43mm/px · 3 of 24 slices shown (2 of 3)]
[im 1/24]
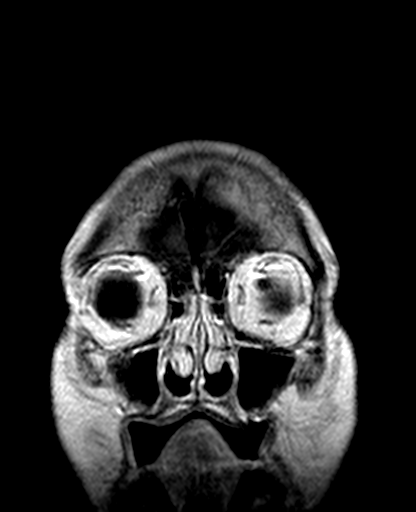
[im 12/24]
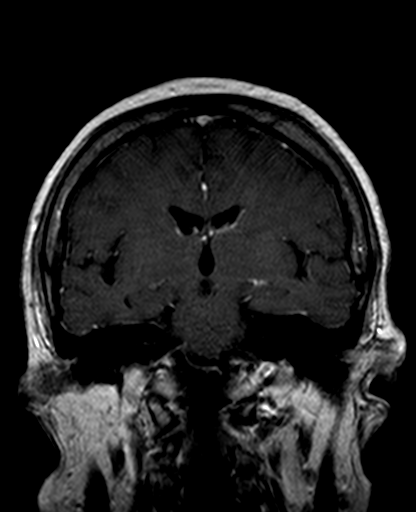
[im 24/24]
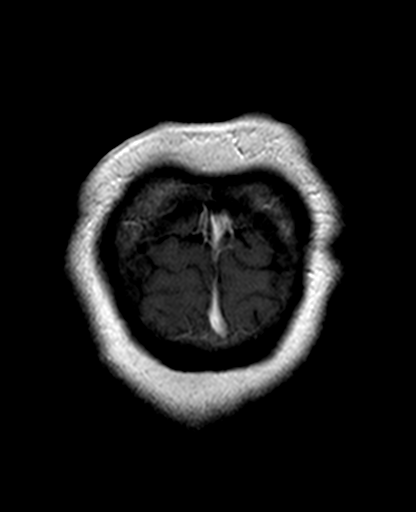

[Series 12: T1 post-contrast · sagittal · 5.0mm · 0.38mm/px · 2 of 17 slices shown (3 of 3)]
[im 1/17]
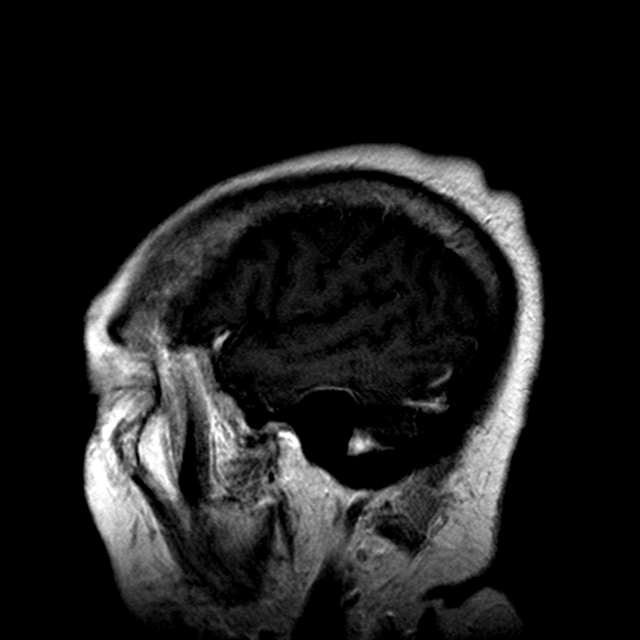
[im 17/17]
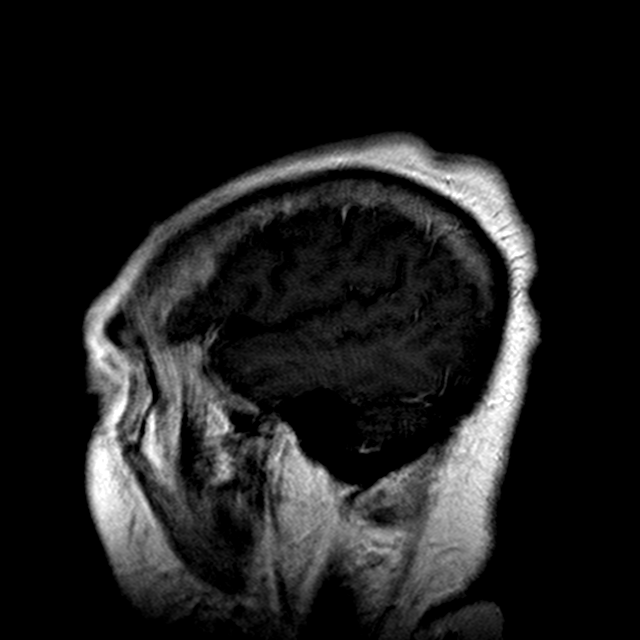

[30 of 48 positions shown; findings below may reference images not displayed]

FINDINGS: There is now cerebral edema associated with the midline
and sphenoid meningioma.  This is greater on the right side and
primarily involving the area of the gyrus rectus.  Meningioma today
measures 26 x 25 x 19 mm (transverse by AP by CC) and measured 12-
13 mm diameter into 1779.  It continues to homogeneously enhance.

No other abnormal intracranial enhancement or discrete intracranial
mass.

No restricted diffusion to suggest acute infarction.  No
ventriculomegaly. No acute intracranial hemorrhage identified.
Outside of the subfrontal region, supratentorial gray and white
matter signal is stable and within normal limits.  There is chronic
moderate to severe T2 hyperintensity in the pons, unchanged in
6773. Major intracranial vascular flow voids are stable.  Negative
visualized cervical spine.  Negative pituitary cervicomedullary
junction.  Normal bone marrow signal.

Stable orbit soft tissues. Visualized paranasal sinuses and
mastoids are clear.  Negative scalp soft tissues.
IMPRESSION: 1.  Chronic planum sphenoidale meningioma has increased in size
since 6773 and now is associated with acute cerebral edema in the
inferior frontal gyri, greater on the right.
2.  No other acute intracranial abnormality.

## 2013-05-12 IMAGING — US US EXTREM LOW VENOUS*L*
1 series · 14 of 24 positions shown · non-contrast
Comparison: Prior examination 07/18/2010.

CLINICAL DATA: Left leg pain.  Question DVT.

LEFT LOWER EXTREMITY VENOUS DUPLEX ULTRASOUND
TECHNIQUE: Gray-scale sonography with graded compression, as well
as color Doppler and duplex ultrasound were performed to evaluate
the deep venous system of the lower extremity from the level of the
common femoral vein through the popliteal and proximal calf veins.
Spectral Doppler was utilized to evaluate flow at rest and with
distal augmentation maneuvers.

[Series 1: us extrem low venous*left* · 0.08mm/px · 14 of 29 slices shown]
[im 1/29]
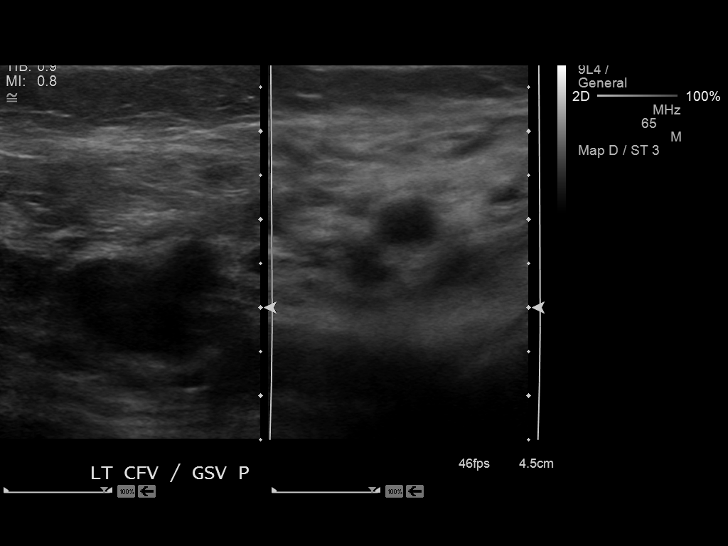
[im 3/29]
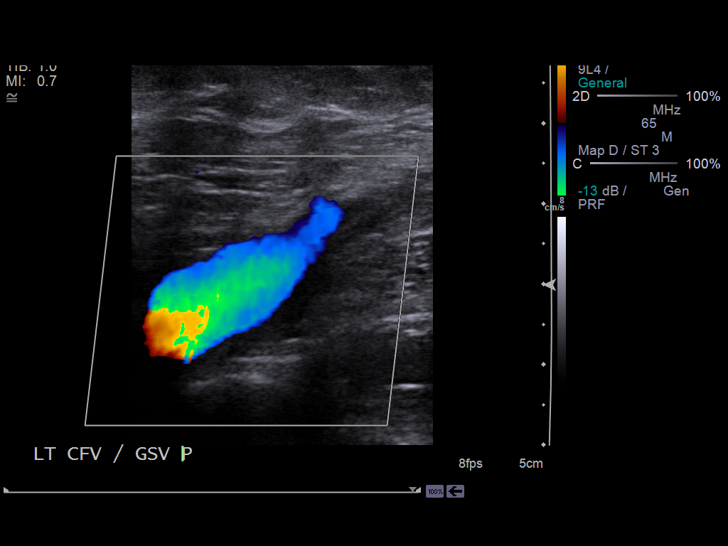
[im 5/29]
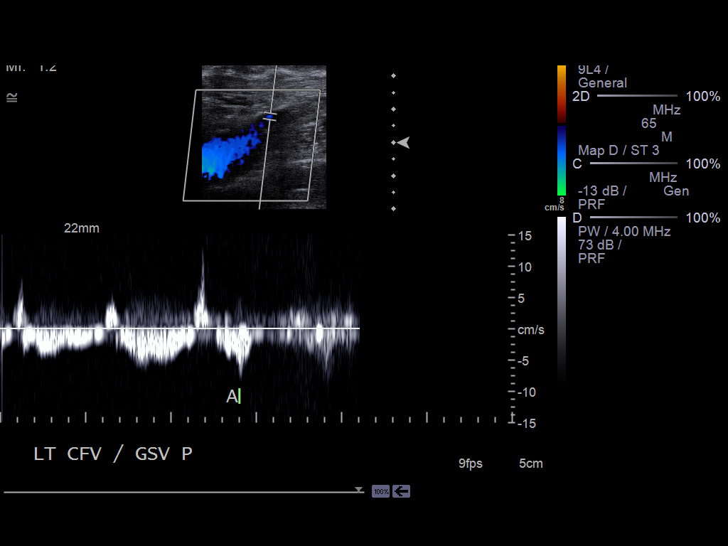
[im 8/29]
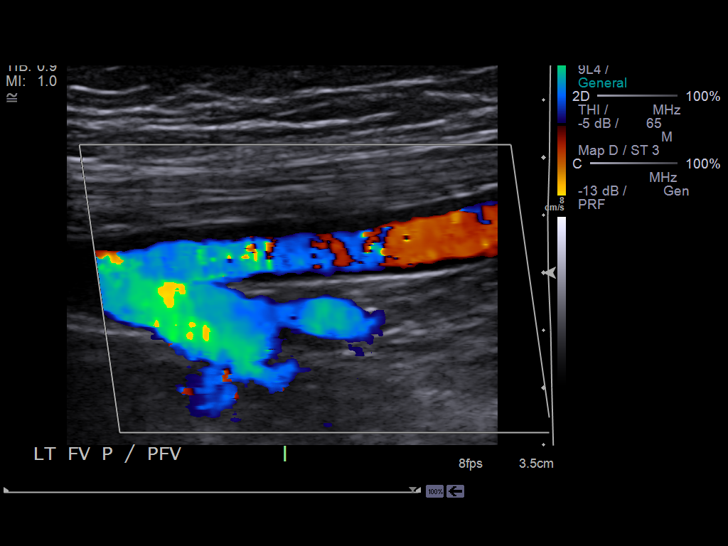
[im 9/29]
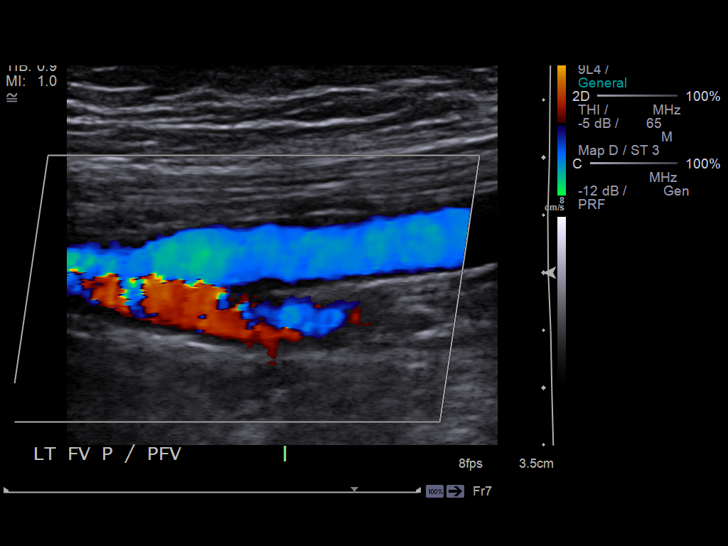
[im 11/29]
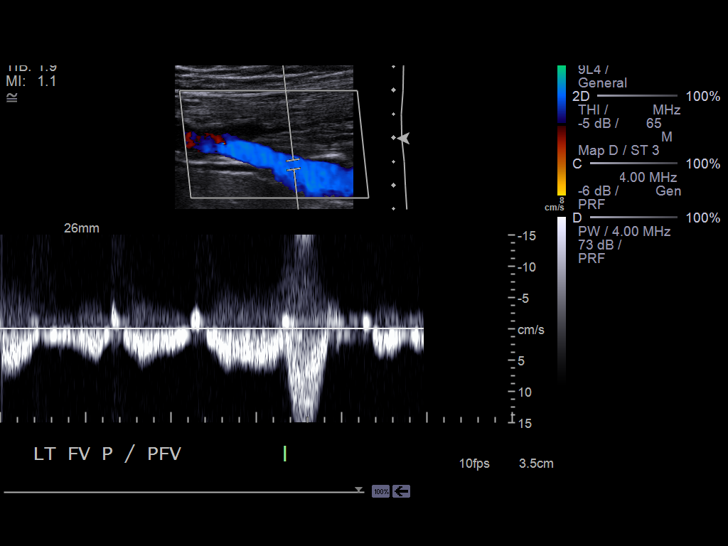
[im 14/29]
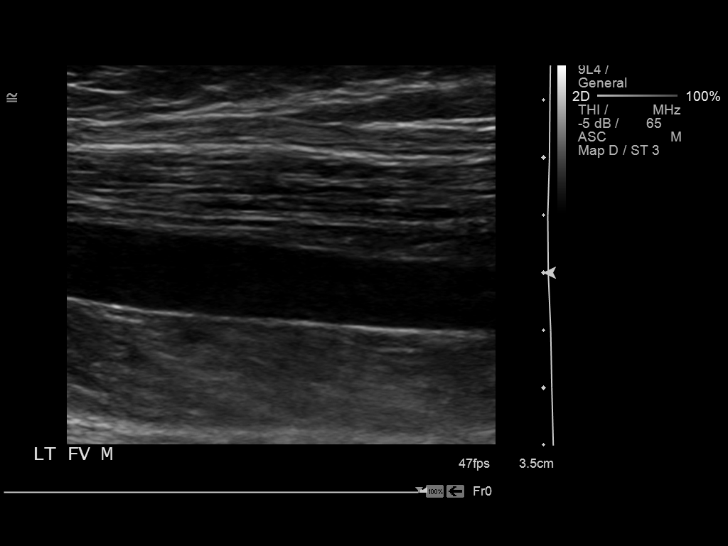
[im 15/29]
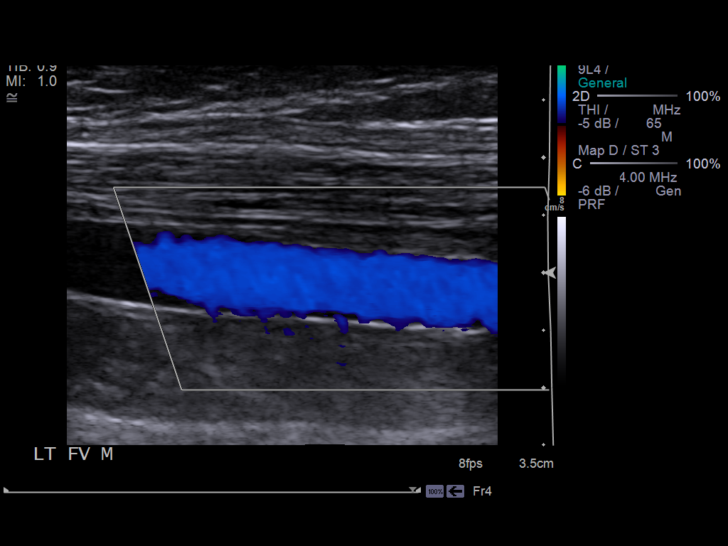
[im 18/29]
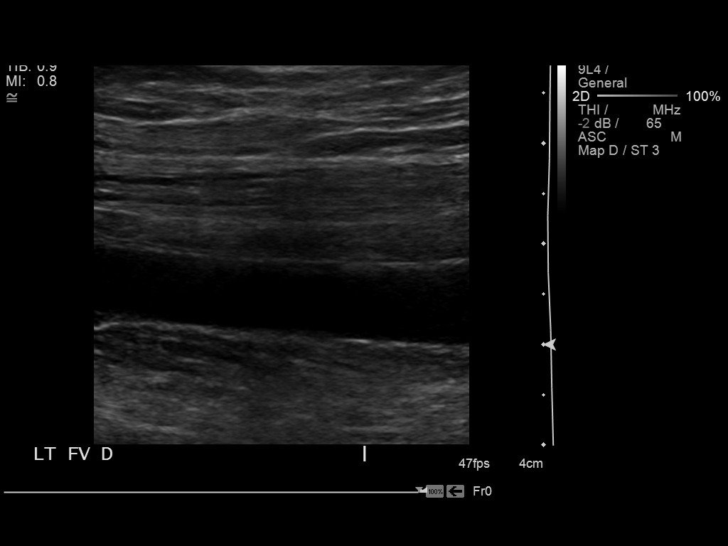
[im 20/29]
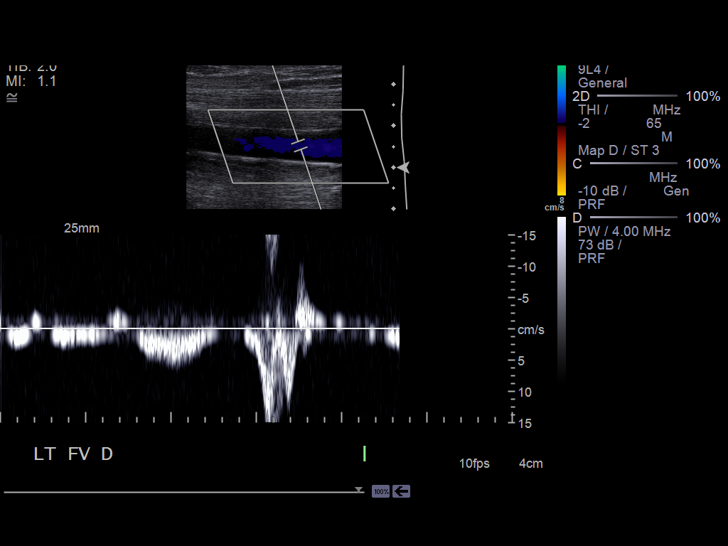
[im 22/29]
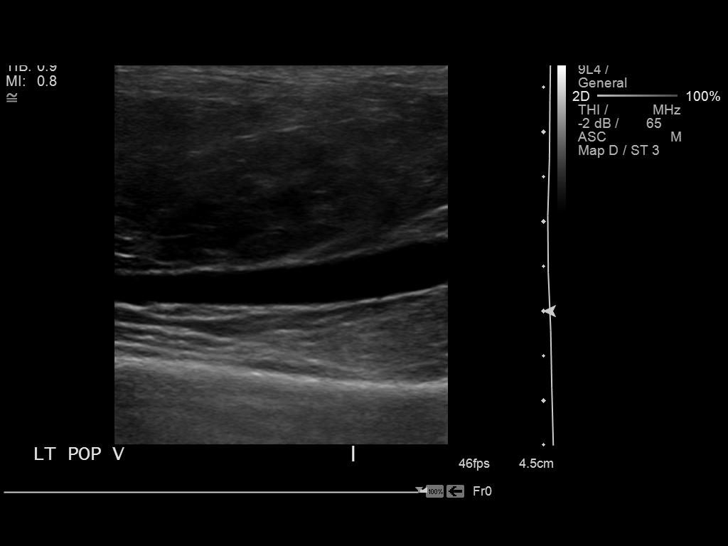
[im 24/29]
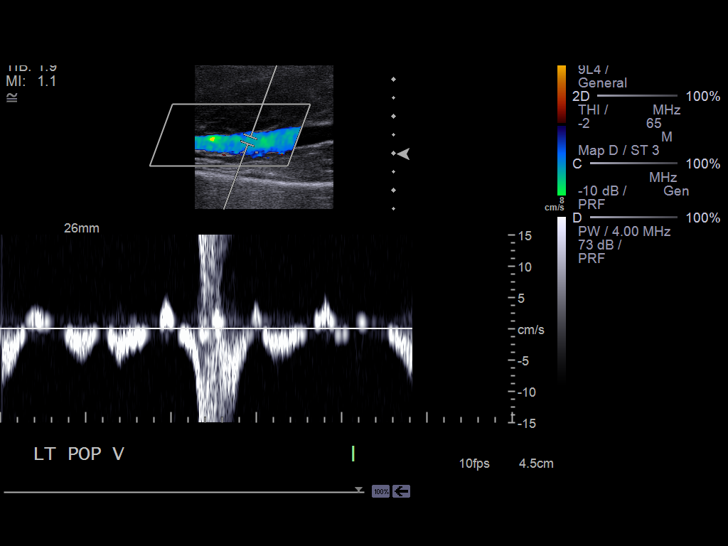
[im 26/29]
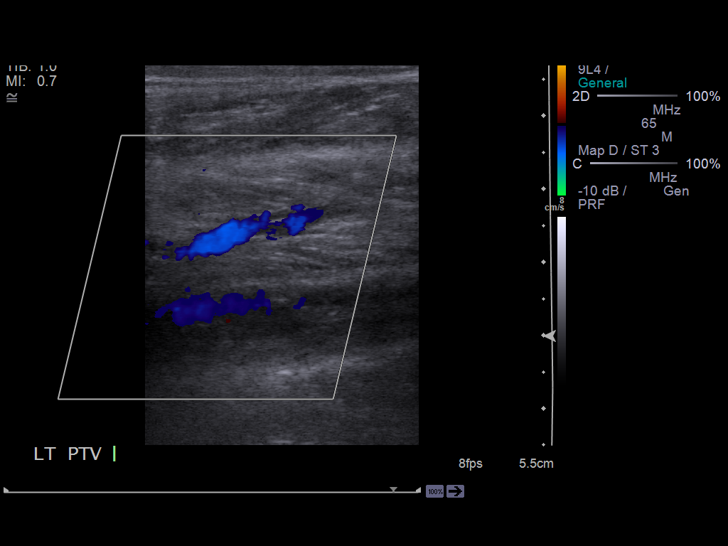
[im 29/29]
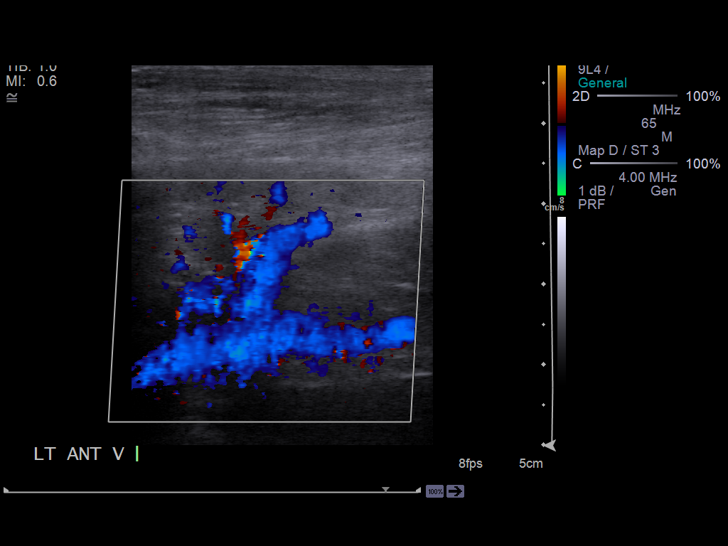

[14 of 24 positions shown; findings below may reference images not displayed]

FINDINGS: Normal compressibility of the common femoral,
superficial femoral, and popliteal veins is demonstrated, as well
as the visualized proximal calf veins.  No filling defects to
suggest DVT on grayscale or color Doppler imaging.  Doppler
waveforms show normal direction of venous flow, normal respiratory
phasicity and response to augmentation.
IMPRESSION: No evidence of acute left lower extremity deep vein thrombosis.

## 2013-05-12 IMAGING — CR DG CHEST 2V
2 series · 2 of 2 positions shown · non-contrast
Comparison: Portable chest x-ray of 08/01/2011

CLINICAL DATA: Numbness in the left leg, weakness

CHEST - 2 VIEW

[view not recorded (1 of 2)]
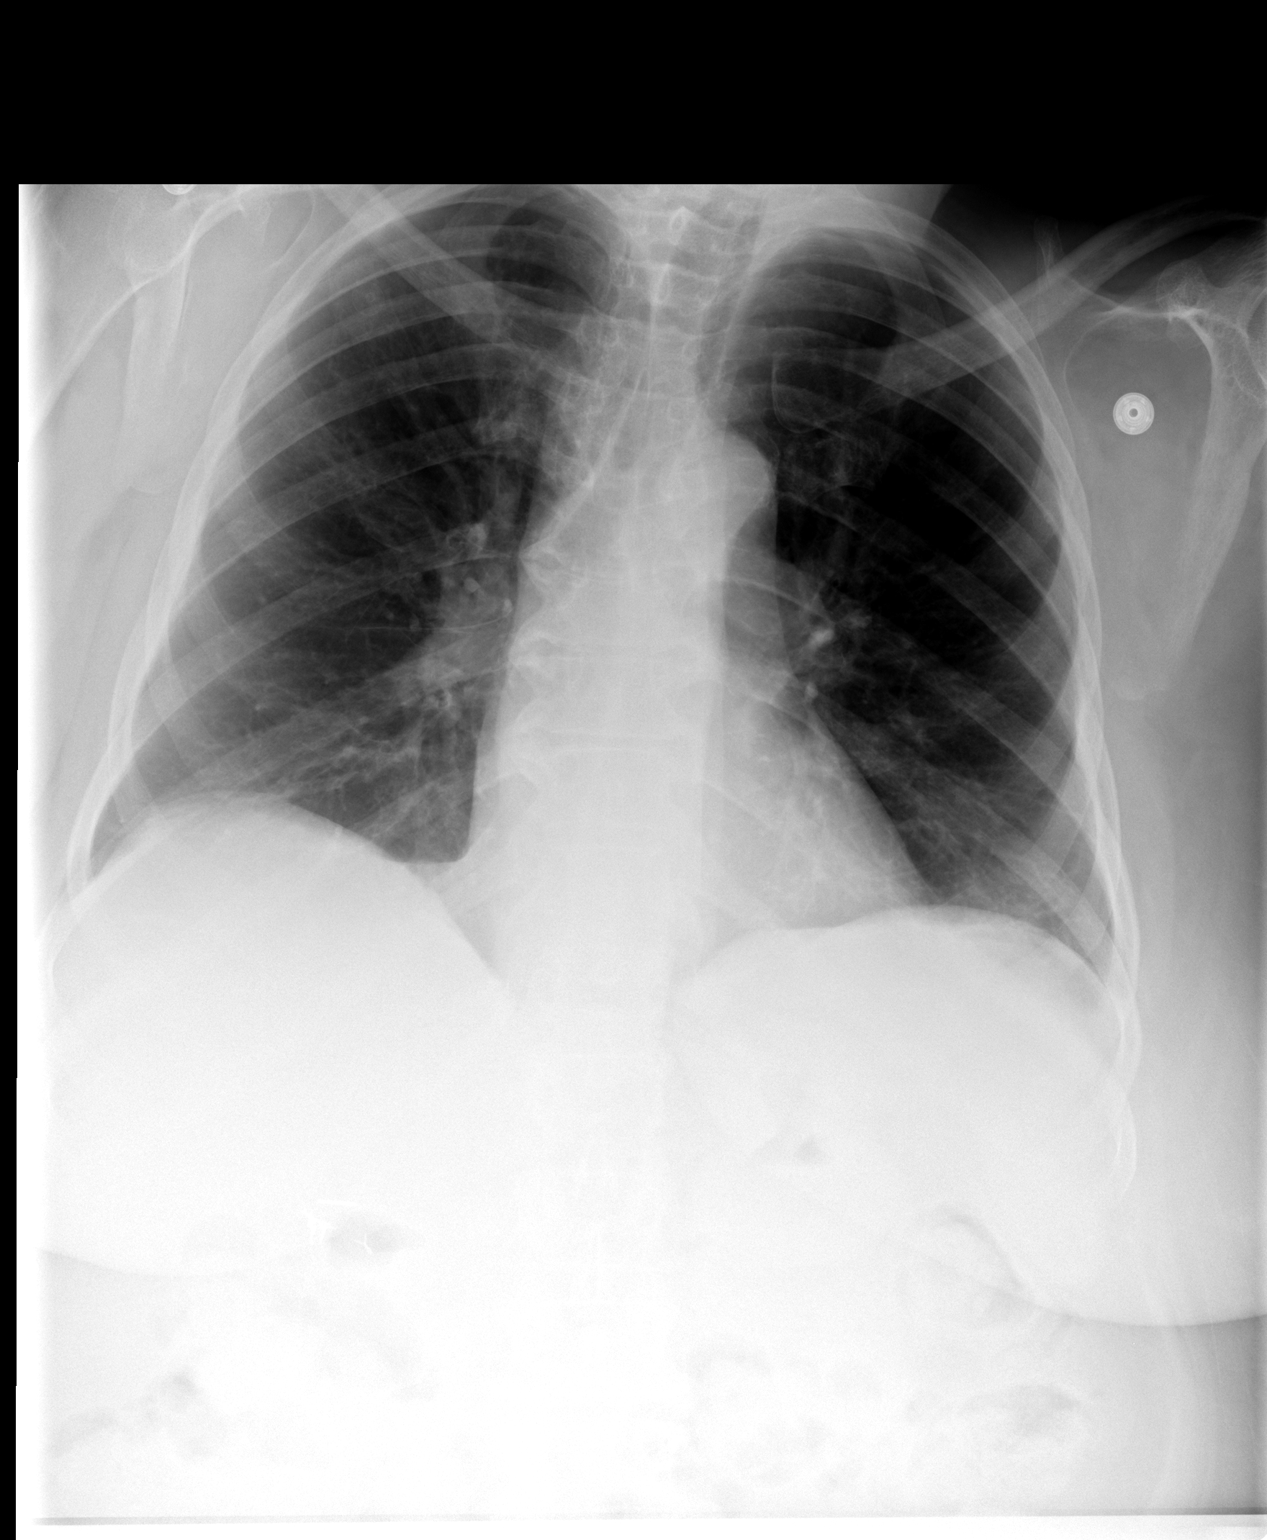

[view not recorded (2 of 2)]
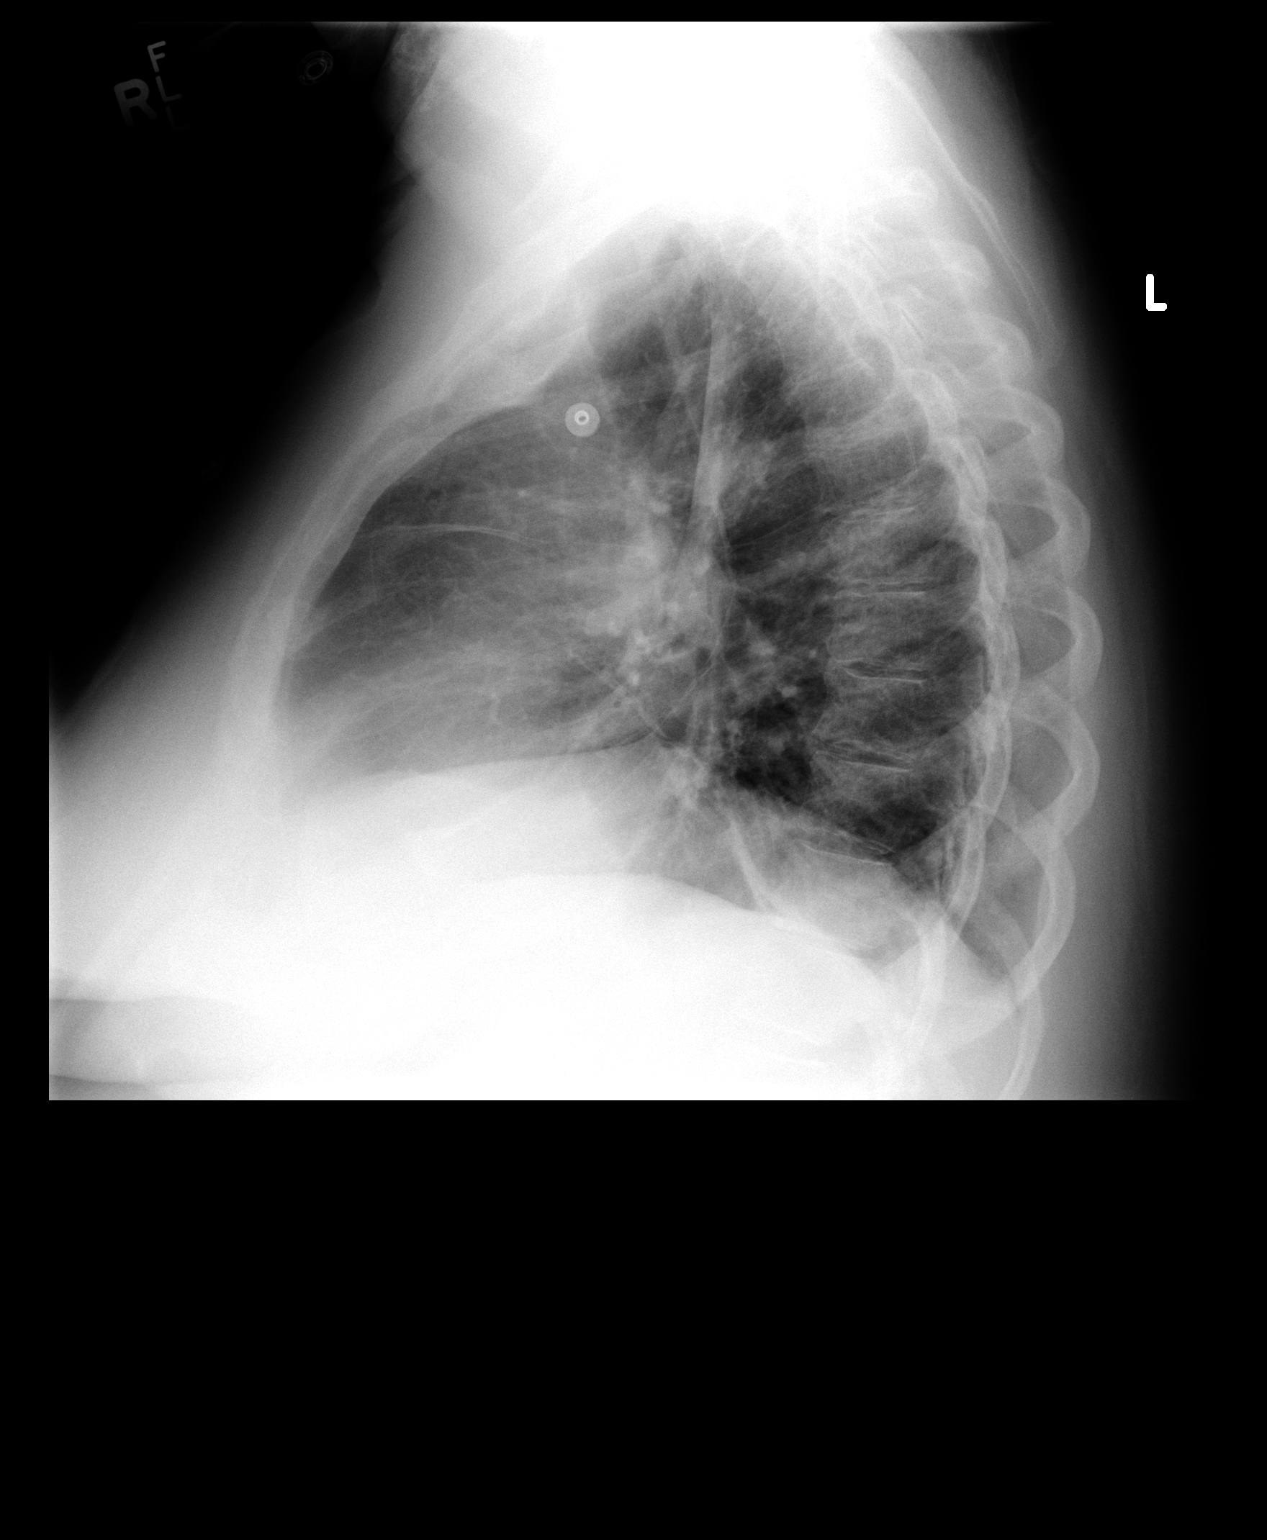

[2 of 2 positions shown; findings below may reference images not displayed]

FINDINGS: Bibasilar linear atelectasis or scarring remains.  No
active infiltrate or effusion is seen.  The heart is within normal
limits in size.  No bony abnormality is seen.
IMPRESSION: No active lung disease.  No change in mild bibasilar linear
atelectasis or scarring.

## 2013-05-12 MED ORDER — LINAGLIPTIN 5 MG PO TABS: 5.0000 mg | ORAL_TABLET | Freq: Every day | ORAL | Status: DC

## 2013-05-12 NOTE — Telephone Encounter (Signed)
States her BP has been running low. Antony Madura checked it and it was 93/64 and her aide from [facility] came out and said it was 90/54. Wants to know if one of her BP meds needs to be reduced. Please advise

## 2013-05-12 NOTE — Telephone Encounter (Signed)
Pt called with a question about her blood pressure. [phone]

## 2013-05-14 ENCOUNTER — Encounter: Payer: Self-pay | Admitting: Gastroenterology

## 2013-05-14 ENCOUNTER — Telehealth: Payer: Self-pay

## 2013-05-14 NOTE — Telephone Encounter (Signed)
Letter faxed to [employer]

## 2013-05-14 NOTE — Telephone Encounter (Signed)
Letter completed.

## 2013-05-14 NOTE — Telephone Encounter (Signed)
Pt has changed insurance to [facility] to do a PA for Aciphex bid- pt has tried and failed: dexilant, protonix, omeprazole bid, and lansoprazole. [employer] formulary includes once daily generics only. Do you want to change or do appeal letter?

## 2013-05-14 NOTE — Telephone Encounter (Signed)
Will do appeal letter. Michel Bickers

## 2013-05-19 NOTE — Telephone Encounter (Signed)
From [facility] in office to me:  "Nicki Reaper from [employer] called to let you know the appeal you have sent for this patient he gave it to his manager they will look over it, it may take 7 days. He wanted you to be aware".   Noted.

## 2013-05-20 ENCOUNTER — Ambulatory Visit (INDEPENDENT_AMBULATORY_CARE_PROVIDER_SITE_OTHER): Payer: Commercial Managed Care - HMO | Admitting: Internal Medicine

## 2013-05-20 ENCOUNTER — Encounter: Payer: Self-pay | Admitting: Internal Medicine

## 2013-05-20 VITALS — BP 110/62 | HR 71 | Ht 61.25 in | Wt 159.8 lb

## 2013-05-20 DIAGNOSIS — Z79899 Other long term (current) drug therapy: Secondary | ICD-10-CM

## 2013-05-20 DIAGNOSIS — G4710 Hypersomnia, unspecified: Secondary | ICD-10-CM

## 2013-05-20 DIAGNOSIS — G4733 Obstructive sleep apnea (adult) (pediatric): Secondary | ICD-10-CM

## 2013-05-20 NOTE — Progress Notes (Signed)
06/18/12- 55 yoF referred courtesy of Dr Joycelyn Schmid [name]-sleep study last year per patient. She reports that she was witnessed stopping breathing in sleep, has a history of loud snoring and is a restless sleeper with frequent waking at night, bathroom trips and nightmares. Easy dyspnea on exertion during the day. She does smoke. New cough and sinus pressure this week attributed to "allergy" with green nasal discharge and no headache. She was sent to hospital April 1 when she says she couldn't wake up while at her doctor's office. She sleeps propped up. Gets sleepy in 30 minutes watching TV during the day. Bedtime 10 PM, sleep latency one or 2 hours, waking during the night 3 or 4 times before up at 8 AM. She had a sleep study at [facility] and was given CPAP but says she can't use it because the machine started smoking and she didn't like the pressure. She was changed from [place] to [place]. She is living alone, divorced and disabled. Son has OSA. History of meningioma 11/23/2011. No seizure in over 3 years. Does not drive. NPSG 3/8/13[name]- mild obstructive sleep apnea, AHI 14 per hour, mostly in REM. Epworth sleepiness score 14/24. Weight 164 pounds.  09/26/12- 48 yoF  Former smoker followed for obstructive sleep apnea follows for:  review download from [place] with pt today.  pt stated that her back has been hurting her.   Did stop smoking! Some wheeze with exertion. She complains that she is falling asleep before she puts her CPAP/ auto/ [employer] on. Download demonstrated poor compliance and control. She had complained of a long history of difficulty falling asleep. Says a neurologist had questioned narcolepsy. She denies cataplexy or sleep paralysis.  11/19/12-62 yoF  Former smoker followed for obstructive sleep apnea  CXR 06/18/12 IMPRESSION:  No acute cardiopulmonary disease.  Mild right hemidiaphragm elevation with right infrahilar volume  loss/atelectasis.  Original Report  Authenticated By: Abigail Miyamoto, M.D.  ROS-see HPI Constitutional:   No-   weight loss, night sweats, fevers, chills, +fatigue, lassitude. HEENT:  + headaches, +difficulty swallowing, tooth/dental problems, sore throat,       +  sneezing, itching, ear ache, +nasal congestion, post nasal drip,  CV:  No-   chest pain, orthopnea, PND, swelling in lower extremities, anasarca, dizziness, palpitations Resp: +  shortness of breath with exertion or at rest.              +  productive cough,  No non-productive cough,  No- coughing up of blood.              No-   change in color of mucus.  No- wheezing.   Skin: No-   rash or lesions. GI:  No-   heartburn, +indigestion, +abdominal pain, nausea, vomiting,  GU:  MS:  No-   joint pain or swelling.   Neuro-     nothing unusual Psych:  No- change in mood or affect. No depression or anxiety.  No memory loss.  OBJ- Physical Exam - +extremely drowsy during conversation General-  Oriented, Affect-flat, Distress- none acute Skin- rash-none, lesions- none, excoriation- none Lymphadenopathy- none Head- atraumatic            Eyes- Gross vision intact, PERRLA, conjunctivae and secretions clear            Ears- Hearing, canals-normal            Nose- Clear, no-Septal dev, mucus, polyps, erosion, perforation  Throat- Mallampati III , mucosa clear , drainage- none, tonsils- atrophic, + dentures Neck- flexible , trachea midline, no stridor , thyroid nl, carotid no bruit Chest - symmetrical excursion , unlabored           Heart/CV- RRR , no murmur , no gallop  , no rub, nl s1 s2                           - JVD- none , edema- none, stasis changes- none, varices- none           Lung- clear to P&A, wheeze- none, cough+rattling , dullness-none, rub- none           Chest wall-  Abd- soft Br/ Gen/ Rectal- Not done, not indicated Extrem- cyanosis- none, clubbing, none, atrophy- none, strength- nl. cane Neuro- lethargic but responsive.     06/18/12- 52 yoF  referred courtesy of Dr Joycelyn Schmid [name]-sleep study last year per patient. She reports that she was witnessed stopping breathing in sleep, has a history of loud snoring and is a restless sleeper with frequent waking at night, bathroom trips and nightmares. Easy dyspnea on exertion during the day. She does smoke. New cough and sinus pressure this week attributed to "allergy" with green nasal discharge and no headache. She was sent to hospital April 1 when she says she couldn't wake up while at her doctor's office. She sleeps propped up. Gets sleepy in 30 minutes watching TV during the day. Bedtime 10 PM, sleep latency one or 2 hours, waking during the night 3 or 4 times before up at 8 AM. She had a sleep study at [facility] and was given CPAP but says she can't use it because the machine started smoking and she didn't like the pressure. She was changed from [place] to [place]. She is living alone, divorced and disabled. Son has OSA. History of meningioma 11/23/2011. No seizure in over 3 years. Does not drive. NPSG 3/8/13[name]- mild obstructive sleep apnea, AHI 14 per hour, mostly in REM. Epworth sleepiness score 14/24. Weight 164 pounds.  09/26/12- 28 yoF  Former smoker followed for obstructive sleep apnea follows for:  review download from [place] with pt today.  pt stated that her back has been hurting her.   Did stop smoking! Some wheeze with exertion. She complains that she is falling asleep before she puts her CPAP/ auto/ [employer] on. Download demonstrated poor compliance and control. She had complained of a long history of difficulty falling asleep. Says a neurologist had questioned narcolepsy. She denies cataplexy or sleep paralysis.  11/19/12- 7 yoF  Former smoker followed for obstructive sleep apnea, ? Lung nodule, complicated by hx resection of meningioma FOLLOWS FOR:  Wearing CPAP 8/ [employer] 6-8 hours per night.  Discuss lung nodule was told she had by Dr. Tula Nakayama Sleep is comfortable with CPAP. Nuvigil sample did not help with daytime alertness. There was questionable lung nodule but not seen on recent CT chest. Incidental slip in bathroom last week- black eye. CT 08/19/12 IMPRESSION:  The opacity in the inferior segment of the lingula of the left  upper lobe is without significant change. It is morphology is  consistent with scarring and / or atelectasis. This does not have  the configuration of a nodule. Other minor areas of reticular  subsegmental atelectasis are stable as well. There is no discrete  lung nodule. The lungs are otherwise clear.  No additional short-term  follow-up is felt indicated. There are no  findings suspicious for neoplastic disease.  Original Report Authenticated By: Lajean Manes, M.D.  05/21/13- 42 yoF  Former smoker followed for obstructive sleep apnea, R/O'd Lung nodule, complicated by hx resection of meningioma FOLLOWS FOR: has been using old CPAP machine(has been waiting for [facility]  to get her outlets changed over for her new CPAP machine). They have done this and still using old CPAP. Pt states Dr Moshe Cipro wants her to have another sleep study. NPSG 05/29/11- AHI 14/ hr CPAP 8/ [employer] Always sleepy with bedtime between 8 and 9 PM, at 7:30 AM. At least twice during the night. "addicted to Ativan"-withdrawal if she skips it. CT chest 12/16/12 IMPRESSION:  Minimal chronic atelectasis versus scarring at the bases of left  lower lobe and lingula.  Otherwise negative exam, with no evidence of pulmonary nodule.  No further workup required.  Electronically Signed  By: Lavonia Dana M.D.  On: 12/16/2012 12:09  ROS-see HPI Constitutional:   No-   weight loss, night sweats, fevers, chills, +fatigue, lassitude. HEENT:  + headaches, +difficulty swallowing, tooth/dental problems, sore throat,       +  sneezing, itching, ear ache, +nasal congestion, post nasal drip,  CV:  No-   chest pain, orthopnea, PND, swelling in lower  extremities, anasarca, dizziness, palpitations Resp: +  shortness of breath with exertion or at rest.              +  productive cough,  No non-productive cough,  No- coughing up of blood.              No-   change in color of mucus.  No- wheezing.   Skin: No-   rash or lesions. GI:  No-   heartburn, +indigestion, no-abdominal pain, nausea, vomiting,  GU:  MS:  No-   joint pain or swelling.   Neuro-     nothing unusual Psych:  No- change in mood or affect. No depression or anxiety.  No memory loss.  OBJ- Physical Exam - + drowsy during conversation General-  Oriented, Affect-flat, Distress- none acute Skin- rash-none, lesions- none, excoriation- none Lymphadenopathy- none Head- + R black eye            Eyes- Gross vision intact, PERRLA, conjunctivae and secretions clear            Ears- Hearing, canals-normal            Nose- Clear, no-Septal dev, mucus, polyps, erosion, perforation             Throat- Mallampati III , mucosa clear , drainage- none, tonsils- atrophic, + dentures Neck- flexible , trachea midline, no stridor , thyroid nl, carotid no bruit Chest - symmetrical excursion , unlabored           Heart/CV- RRR , no murmur , no gallop  , no rub, nl s1 s2                           - JVD- none , edema- none, stasis changes- none, varices- none           Lung- clear to P&A, wheeze- none, cough+rattling , dullness-none, rub- none           Chest wall-  Abd- soft Br/ Gen/ Rectal- Not done, not indicated Extrem- cyanosis- none, clubbing, none, atrophy- none, strength- nl. +cane Neuro- + somewhat slow but oriented.

## 2013-05-20 NOTE — Telephone Encounter (Signed)
aciphex was approved for 3 months

## 2013-05-20 NOTE — Patient Instructions (Signed)
Order- Schedule NPSG split protocol   Dx OSA, hypersomnia              Followed next day by Multiple Sleep Latencyt Test   If possible, skip the Percocet/ oxycodone and promethazine on the test days

## 2013-05-21 NOTE — Telephone Encounter (Signed)
Approval letter faxed to the pharmacy

## 2013-05-22 ENCOUNTER — Ambulatory Visit: Payer: Commercial Managed Care - HMO | Admitting: Neurology

## 2013-05-23 ENCOUNTER — Ambulatory Visit: Payer: Medicare Other | Admitting: Neurology

## 2013-05-26 ENCOUNTER — Telehealth: Payer: Self-pay | Admitting: Family Medicine

## 2013-05-26 ENCOUNTER — Ambulatory Visit: Payer: Medicare HMO | Admitting: Gastroenterology

## 2013-05-26 DIAGNOSIS — E039 Hypothyroidism, unspecified: Secondary | ICD-10-CM

## 2013-05-26 NOTE — Telephone Encounter (Signed)
Referral entered  

## 2013-05-27 ENCOUNTER — Other Ambulatory Visit: Payer: Self-pay

## 2013-05-27 MED ORDER — LINACLOTIDE 290 MCG PO CAPS
ORAL_CAPSULE | ORAL | Status: DC
Start: 2013-05-27 — End: 2013-06-06

## 2013-05-28 ENCOUNTER — Other Ambulatory Visit: Payer: Self-pay | Admitting: Family Medicine

## 2013-05-29 ENCOUNTER — Telehealth: Payer: Self-pay

## 2013-05-30 NOTE — Telephone Encounter (Signed)
May discuss her headache at that visit, hopefully she already has, if not, needs to use medication for allergy symptoms that she has, has flonase , claritin  Or zyrtec , both oTC may be added one daily

## 2013-06-02 ENCOUNTER — Telehealth: Payer: Self-pay | Admitting: Family Medicine

## 2013-06-02 ENCOUNTER — Encounter: Payer: Self-pay | Admitting: Family Medicine

## 2013-06-02 DIAGNOSIS — E1140 Type 2 diabetes mellitus with diabetic neuropathy, unspecified: Secondary | ICD-10-CM

## 2013-06-02 NOTE — Telephone Encounter (Signed)
Referred to podiatry.

## 2013-06-02 NOTE — Telephone Encounter (Signed)
I don't see a previous referral put in. Ok to refer? What is the diagnosis?

## 2013-06-02 NOTE — Telephone Encounter (Signed)
dibetes , all diabetics can see podiatry for foot care with medicare, reason toenail cutting, but dx is diabetes, pls enter

## 2013-06-03 ENCOUNTER — Telehealth: Payer: Self-pay | Admitting: Family Medicine

## 2013-06-03 ENCOUNTER — Ambulatory Visit
Admission: RE | Admit: 2013-06-03 | Discharge: 2013-06-03 | Disposition: A | Discharge status: Home / Self Care | Payer: Commercial Managed Care - HMO | Source: Ambulatory Visit

## 2013-06-03 DIAGNOSIS — Z1231 Encounter for screening mammogram for malignant neoplasm of breast: Secondary | ICD-10-CM

## 2013-06-03 NOTE — Telephone Encounter (Signed)
Needs someone to call [facility] her insurance wants a NP from there to come visit patient and they need a referral also and needs the diag code for what she is being treated for number for [place] fax number is [phone] patient received a letter from [facility]

## 2013-06-04 ENCOUNTER — Encounter: Payer: Self-pay | Admitting: Family Medicine

## 2013-06-05 ENCOUNTER — Ambulatory Visit: Payer: Medicare HMO | Admitting: Neurology

## 2013-06-06 ENCOUNTER — Emergency Department (HOSPITAL_COMMUNITY): Payer: Medicare HMO

## 2013-06-06 ENCOUNTER — Ambulatory Visit (INDEPENDENT_AMBULATORY_CARE_PROVIDER_SITE_OTHER): Payer: Commercial Managed Care - HMO | Admitting: Neurology

## 2013-06-06 ENCOUNTER — Encounter: Payer: Self-pay | Admitting: Neurology

## 2013-06-06 ENCOUNTER — Emergency Department (HOSPITAL_COMMUNITY)
Admission: EM | Admit: 2013-06-06 | Discharge: 2013-06-06 | Disposition: A | Discharge status: Home / Self Care | Payer: Medicare HMO | Attending: Emergency Medicine | Admitting: Emergency Medicine

## 2013-06-06 ENCOUNTER — Encounter (HOSPITAL_COMMUNITY): Payer: Self-pay | Admitting: Emergency Medicine

## 2013-06-06 VITALS — BP 80/46 | HR 60 | Temp 97.0°F | Resp 18 | Ht 64.0 in | Wt 161.9 lb

## 2013-06-06 DIAGNOSIS — Z86018 Personal history of other benign neoplasm: Secondary | ICD-10-CM

## 2013-06-06 DIAGNOSIS — Z79899 Other long term (current) drug therapy: Secondary | ICD-10-CM | POA: Insufficient documentation

## 2013-06-06 DIAGNOSIS — Z9181 History of falling: Secondary | ICD-10-CM | POA: Diagnosis not present

## 2013-06-06 DIAGNOSIS — Z9889 Other specified postprocedural states: Secondary | ICD-10-CM

## 2013-06-06 DIAGNOSIS — Z8673 Personal history of transient ischemic attack (TIA), and cerebral infarction without residual deficits: Secondary | ICD-10-CM

## 2013-06-06 DIAGNOSIS — Z7982 Long term (current) use of aspirin: Secondary | ICD-10-CM | POA: Insufficient documentation

## 2013-06-06 DIAGNOSIS — F17200 Nicotine dependence, unspecified, uncomplicated: Secondary | ICD-10-CM | POA: Insufficient documentation

## 2013-06-06 DIAGNOSIS — E860 Dehydration: Secondary | ICD-10-CM | POA: Diagnosis not present

## 2013-06-06 DIAGNOSIS — I959 Hypotension, unspecified: Secondary | ICD-10-CM

## 2013-06-06 DIAGNOSIS — E039 Hypothyroidism, unspecified: Secondary | ICD-10-CM | POA: Insufficient documentation

## 2013-06-06 DIAGNOSIS — F411 Generalized anxiety disorder: Secondary | ICD-10-CM | POA: Insufficient documentation

## 2013-06-06 DIAGNOSIS — Z794 Long term (current) use of insulin: Secondary | ICD-10-CM | POA: Diagnosis not present

## 2013-06-06 DIAGNOSIS — F8089 Other developmental disorders of speech and language: Secondary | ICD-10-CM | POA: Insufficient documentation

## 2013-06-06 DIAGNOSIS — M129 Arthropathy, unspecified: Secondary | ICD-10-CM | POA: Diagnosis not present

## 2013-06-06 DIAGNOSIS — R404 Transient alteration of awareness: Secondary | ICD-10-CM | POA: Diagnosis not present

## 2013-06-06 DIAGNOSIS — R569 Unspecified convulsions: Secondary | ICD-10-CM

## 2013-06-06 DIAGNOSIS — G8929 Other chronic pain: Secondary | ICD-10-CM | POA: Diagnosis not present

## 2013-06-06 DIAGNOSIS — Z862 Personal history of diseases of the blood and blood-forming organs and certain disorders involving the immune mechanism: Secondary | ICD-10-CM | POA: Diagnosis not present

## 2013-06-06 DIAGNOSIS — IMO0002 Reserved for concepts with insufficient information to code with codable children: Secondary | ICD-10-CM | POA: Diagnosis not present

## 2013-06-06 DIAGNOSIS — G4730 Sleep apnea, unspecified: Secondary | ICD-10-CM | POA: Insufficient documentation

## 2013-06-06 DIAGNOSIS — G40909 Epilepsy, unspecified, not intractable, without status epilepticus: Secondary | ICD-10-CM | POA: Insufficient documentation

## 2013-06-06 DIAGNOSIS — R7309 Other abnormal glucose: Secondary | ICD-10-CM

## 2013-06-06 DIAGNOSIS — K219 Gastro-esophageal reflux disease without esophagitis: Secondary | ICD-10-CM | POA: Diagnosis not present

## 2013-06-06 DIAGNOSIS — R51 Headache: Secondary | ICD-10-CM

## 2013-06-06 DIAGNOSIS — E119 Type 2 diabetes mellitus without complications: Secondary | ICD-10-CM | POA: Insufficient documentation

## 2013-06-06 DIAGNOSIS — G40209 Localization-related (focal) (partial) symptomatic epilepsy and epileptic syndromes with complex partial seizures, not intractable, without status epilepticus: Secondary | ICD-10-CM

## 2013-06-06 DIAGNOSIS — F319 Bipolar disorder, unspecified: Secondary | ICD-10-CM | POA: Insufficient documentation

## 2013-06-06 DIAGNOSIS — R519 Headache, unspecified: Secondary | ICD-10-CM

## 2013-06-06 DIAGNOSIS — R739 Hyperglycemia, unspecified: Secondary | ICD-10-CM

## 2013-06-06 DIAGNOSIS — G4710 Hypersomnia, unspecified: Secondary | ICD-10-CM

## 2013-06-06 HISTORY — DX: Repeated falls: R29.6

## 2013-06-06 HISTORY — DX: Unspecified mononeuropathy of unspecified lower limb: G57.90

## 2013-06-06 LAB — CBC WITH DIFFERENTIAL/PLATELET
BASOS ABS: 0 10*3/uL (ref 0.0–0.1)
Basophils Relative: 0 % (ref 0–1)
EOS ABS: 0.1 10*3/uL (ref 0.0–0.7)
Eosinophils Relative: 2 % (ref 0–5)
HCT: 34.4 % — ABNORMAL LOW (ref 36.0–46.0)
Hemoglobin: 10.5 g/dL — ABNORMAL LOW (ref 12.0–15.0)
LYMPHS PCT: 46 % (ref 12–46)
Lymphs Abs: 2.9 10*3/uL (ref 0.7–4.0)
MCH: 22.6 pg — ABNORMAL LOW (ref 26.0–34.0)
MCHC: 30.5 g/dL (ref 30.0–36.0)
MCV: 74.1 fL — ABNORMAL LOW (ref 78.0–100.0)
MONO ABS: 0.4 10*3/uL (ref 0.1–1.0)
Monocytes Relative: 6 % (ref 3–12)
NEUTROS PCT: 46 % (ref 43–77)
Neutro Abs: 2.8 10*3/uL (ref 1.7–7.7)
PLATELETS: 197 10*3/uL (ref 150–400)
RBC: 4.64 MIL/uL (ref 3.87–5.11)
RDW: 14.2 % (ref 11.5–15.5)
WBC: 6.2 10*3/uL (ref 4.0–10.5)

## 2013-06-06 LAB — URINALYSIS, ROUTINE W REFLEX MICROSCOPIC
BILIRUBIN URINE: NEGATIVE
Glucose, UA: 100 mg/dL — AB
HGB URINE DIPSTICK: NEGATIVE
Ketones, ur: NEGATIVE mg/dL
Leukocytes, UA: NEGATIVE
NITRITE: NEGATIVE
PH: 6 (ref 5.0–8.0)
Protein, ur: NEGATIVE mg/dL
Specific Gravity, Urine: >1.03 — ABNORMAL HIGH (ref 1.005–1.030)
UROBILINOGEN UA: 0.2 mg/dL (ref 0.0–1.0)

## 2013-06-06 LAB — COMPREHENSIVE METABOLIC PANEL
ALBUMIN: 3.3 g/dL — AB (ref 3.5–5.2)
ALK PHOS: 162 U/L — AB (ref 39–117)
ALT: 17 U/L (ref 0–35)
AST: 16 U/L (ref 0–37)
BUN: 24 mg/dL — ABNORMAL HIGH (ref 6–23)
CALCIUM: 9.9 mg/dL (ref 8.4–10.5)
CO2: 26 mEq/L (ref 19–32)
Chloride: 107 mEq/L (ref 96–112)
Creatinine, Ser: 0.75 mg/dL (ref 0.50–1.10)
GFR calc Af Amer: >90 mL/min (ref >90)
GFR calc non Af Amer: 89 mL/min — ABNORMAL LOW (ref >90)
Glucose, Bld: 208 mg/dL — ABNORMAL HIGH (ref 70–99)
POTASSIUM: 4.3 meq/L (ref 3.7–5.3)
Sodium: 144 mEq/L (ref 137–147)
TOTAL PROTEIN: 7.2 g/dL (ref 6.0–8.3)
Total Bilirubin: 0.2 mg/dL — ABNORMAL LOW (ref 0.3–1.2)

## 2013-06-06 LAB — CBG MONITORING, ED: Glucose-Capillary: 248 mg/dL — ABNORMAL HIGH (ref 70–99)

## 2013-06-06 LAB — TROPONIN I: Troponin I: <0.3 ng/mL (ref <0.30)

## 2013-06-06 MED ORDER — SODIUM CHLORIDE 0.9 % IV BOLUS (SEPSIS)
1000.0000 mL | Freq: Once | INTRAVENOUS | Status: AC
  Administered 2013-06-06: 1000 mL via INTRAVENOUS

## 2013-06-06 NOTE — Progress Notes (Signed)
NEUROLOGY FOLLOW UP OFFICE NOTE  [name] [id number]  HISTORY OF PRESENT ILLNESS: [name] is a 63 year old right-handed woman with history of hypertension, stroke, type II diabetes, stroke with residual left-sided weakness, seizure disorder, mononeuritis of leg, OSA, thyroid nodule, cervical disc disease, depression, anxiety, chronic pain, chronic back pain with gait instability and falls, polypharmacy, meningioma resection in September 2013 who follows up for chronic headaches, seizures, and meningioma.  Records and images were personally reviewed where available.    She has been treated by Dr. Gerilyn Pilgrim and [facility] in the past.  UPDATE: In December and January, she was having very elevated blood pressure (190s systolic).  Her blood pressure medication was increased.  This morning, her blood pressure was 131/49, so she didn't take her blood pressure pill.  She checked her blood sugar, which was 488.  It is never usually that elevated.  She took 30 units of Lantus.  In the office, her blood pressure in 80/46 and blood sugar.  She also reports problems with memory, word-finding difficulties, and problems with name recognition of acquaintances of people at church.  This has been going on for about a year.  HEADACHES: She has longstanding history of multifactorial chronic headaches.  They are described as from the back of her head and top of the front of her head.  It is a pounding pain, 8/10.  There is some nausea, but no photophobia or phonophobia.  She has blurred vision, but that is constant.  They usually last all day and occur 2 to 3 times a week.  She usually takes Tylenol for it, but she also is taking oxycodone for left arm pain.  Prior medications included lamotrigine, gabapentin, Lyrica, metoprolol, amitriptyline, and imipramine.  She is not a candidate for triptans due to history of stroke.  Update:  given the complexity of the headaches, such as  medication-overuse, and use of opioids daiIy recommended management by a headache specialist.  She saw [facility] Neale Burly [facility].  She appears to be a candidate for Botox.  She didn't wish to pursue this, so she has been getting trigger point injections every two weeks, which is helping.  She still is on Oxycontin, but limiting intake.  SEIZURES: She has history of seizures for many years.  Her last seizure was about 3 years ago.  She is taking Lamictal 100mg  daily.  Her last EEG from 08/08/12 revealed right temporal epileptiform discharges.  Labs from 04/11/13 reveal Na 142, K 4.5, glucose 264, BUN 19, Cr 0.84, TB 0.3, AP 163, AST 16, ALT 18, TP 6.9, Alb 3.9 and Ca 9.9.  Update:  No seizures.  MENINGIOMA: She had a known history of meningioma.  She presented to the ED in August 2013 with sudden onset numbness of the feet and left hand weakness and numbness.  MRI of brain noted enlargement of previous planum sphenoidale meningioma compared to prior study in 2009, with vasogenic edema.  In September 2013, she underwent a bicoronal craniotomy and gross total resection.  Repeat MRI of brain with and without contrast performed on 05/01/12 revealed postop resection of meningioma of the planum sphenoidale with small amount of residual enhancing tissue along the planum sphenoidale (5 x 58mm), postop blood in the bilateral frontal lobes and bilateral encephalomalacia of the gyrus rectus bilaterally.  STROKE: She has a history of stroke going back several years ago.  Semiology is unclear but reports left-sided issues.  She was  started on Plavix, but last visit, she was switched over to ASA 81mg .  Labs from 01/09/13 revealed LDL of 101 and Hgb A1c of 7.1.  Recommended optimizing LDL to less than 70.  Repeat Hgb A1c and Lipid panel seems to have been ordered in February but results not available.  OTHER HISTORY: She has history of cervical disc disease.  Recent MRI of cervical spine  revealed shallow central disc protrusion at C3-4, degenerative disc disease and facet disease at C5-6 with a central disc protrusion, and shallow left paracentral and medial foraminal disc protrusion at C7-T1.  Dr. Sherwood Gambler, her neurosurgeon, did not think she was a surgical candidate.  She also has history of OSA.  Currently on CPAP and is compliant.  She still has hypersomnolence, and is scheduled for a sleep study.  She is on multiple medications for many chronic conditions as listed above, including, Norco, lorazepam, meclizine, mirtazapine, methocarbamol, Lyrica, gabapentin, Zoloft, phenergan, Lamictal, levothyroxine and naproxen.  She has been hospitalized on occasion for altered mental status.    PAST MEDICAL HISTORY: Past Medical History  Diagnosis Date  . Bipolar disorder   . CVA (cerebral infarction)   . Pancreatitis 2006    due to Depakote with normal EUS   . Osteoporosis   . Chronic back pain   . Diabetes mellitus   . Trigger finger   . Anxiety disorder   . Hypertension   . Migraines   . Diverticulosis     TCS 9/08 by Dr. Delfin Edis for diarrhea . Bx for micro scopic colitis negative.   . Schatzki's ring     non critical / EGD with ED 8/2011with RMR  . S/P colonoscopy 5638,7564, 2011    left-sided diverticula, hx of simple adenomas . 2011, random bx negative for microscopic colitis  . Glaucoma   . Allergic rhinitis   . Hypothyroidism     thyroid goiter  . Anemia   . Blood transfusion   . GERD (gastroesophageal reflux disease)   . Stroke     left sided weakness  . Seizures     unknown etiology-on meds-last seizure was 3 yerars ago  . Anxiety   . Depression   . Metabolic encephalopathy 3/32/9518  . Sleep apnea   . Arthritis   . Gum symptoms     infection on antibiotic    MEDICATIONS: Current Outpatient Prescriptions on File Prior to Visit  Medication Sig Dispense Refill  . aspirin 81 MG tablet Take 81 mg by mouth daily.      . B Complex-Biotin-FA  (B-COMPLEX PO) Take 1 tablet by mouth daily.      . clobetasol (TEMOVATE) 0.05 % external solution Apply 1 application topically 2 (two) times daily.       . cloNIDine (CATAPRES) 0.3 MG tablet One tablet every 8 hours, take at 8am, 4pm and 12 midnight  90 tablet  5  . dexlansoprazole (DEXILANT) 60 MG capsule Take 1 capsule (60 mg total) by mouth daily.  31 capsule  2  . diclofenac sodium (VOLTAREN) 1 % GEL Apply 2 g topically daily as needed (Pain).      [name] diphenoxylate-atropine (LOMOTIL) 2.5-0.025 MG per tablet Take 1 tablet by mouth 2 (two) times daily as needed for diarrhea or loose stools.  30 tablet  0  . fluticasone (FLONASE) 50 MCG/ACT nasal spray Place 1 spray into both nostrils daily. Allergies  16 g  3  . hydrochlorothiazide (MICROZIDE) 12.5 MG capsule Take 12.5 mg by mouth daily.      [name]  hydroxychloroquine (PLAQUENIL) 200 MG tablet Take 200 mg by mouth daily.      [name] lamoTRIgine (LAMICTAL) 100 MG tablet Take 150 mg by mouth every morning.       [name] levothyroxine (LEVOXYL) 50 MCG tablet Take 50 mcg by mouth every morning.       . Linaclotide (LINZESS) 290 MCG CAPS capsule TAKE 1 CAPSULE BY MOUTH ONCE DAILY 30 MINUTES BEFORE BREAKFAST.  30 capsule  5  . linagliptin (TRADJENTA) 5 MG TABS tablet Take 1 tablet (5 mg total) by mouth daily.  30 tablet  2  . LORazepam (ATIVAN) 0.5 MG tablet Take 0.5 mg by mouth 3 (three) times daily.      [name] losartan (COZAAR) 100 MG tablet Take 1 tablet (100 mg total) by mouth daily.  30 tablet  11  . methocarbamol (ROBAXIN) 500 MG tablet Take 500 mg by mouth daily.       . metoprolol (LOPRESSOR) 50 MG tablet Take 1 tablet (50 mg total) by mouth 2 (two) times daily.  60 tablet  5  . mirtazapine (REMERON) 30 MG tablet Take 30 mg by mouth at bedtime.       . montelukast (SINGULAIR) 10 MG tablet TAKE ONE TABLET DAILY.      . Multiple Vitamins-Minerals (ONE-A-DAY 50 PLUS PO) Take 1 tablet by mouth daily.      . ondansetron (ZOFRAN) 4 MG tablet Take 1 tablet (4 mg  total) by mouth 3 (three) times daily with meals.  90 tablet  3  . oxyCODONE-acetaminophen (PERCOCET) 10-325 MG per tablet Take 1 tablet by mouth 3 (three) times daily as needed for pain.       . pregabalin (LYRICA) 75 MG capsule Take 75 mg by mouth at bedtime.       . Probiotic Product (PROBIOTIC FORMULA PO) Take 1 capsule by mouth daily.      . promethazine (PHENERGAN) 12.5 MG tablet Take 1 tablet (12.5 mg total) by mouth every 6 (six) hours as needed. FOR NAUSEA AND VOMITING  30 tablet  0  . rosuvastatin (CRESTOR) 5 MG tablet Take 1 tablet (5 mg total) by mouth at bedtime.  30 tablet  11  . sertraline (ZOLOFT) 100 MG tablet Take 150 mg by mouth at bedtime.       [name] thioridazine (MELLARIL) 10 MG tablet Take 20 mg by mouth at bedtime.       . traZODone (DESYREL) 50 MG tablet Take 50 mg by mouth at bedtime.       . Vitamin D, Ergocalciferol, (DRISDOL) 50000 UNITS CAPS capsule TAKE 1 CAPSULE BY MOUTH ONCE WEEKLY.  4 capsule  2  . glipiZIDE (GLUCOTROL XL) 2.5 MG 24 hr tablet TAKE 1 TABLET WITH BREAKFAST.  30 tablet  3   No current facility-administered medications on file prior to visit.    ALLERGIES: Allergies  Allergen Reactions  . Cephalexin Hives  . Iron Nausea And Vomiting  . Milk-Related Compounds Other (See Comments)    Doesn't agree with stomach.   . Penicillins Hives  . Phenazopyridine Hcl Other (See Comments)    [place]      FAMILY HISTORY: Family History  Problem Relation Age of Onset  . Heart attack Mother   . Pneumonia Father   . Kidney failure Father   . Cancer Sister     pancreatic  . Diabetes Brother   . Hypertension Brother   . Colon cancer Neg Hx   . Anesthesia problems Neg Hx   .  Hypotension Neg Hx   . Malignant hyperthermia Neg Hx   . Pseudochol deficiency Neg Hx     SOCIAL HISTORY: History   Social History  . Marital Status: Divorced    Spouse Name: N/A    Number of Children: 1  . Years of Education: N/A   Occupational History  . disabled      Social History Main Topics  . Smoking status: Current Some Day Smoker -- 0.25 packs/day for 7 years    Types: Cigarettes    Last Attempt to Quit: 09/22/2012  . Smokeless tobacco: Never Used     Comment: started at 24 smoked 6 yrs and stopped. restarted 2 months ago  . Alcohol Use: No     Comment: 5 cigs/day   . Drug Use: No  . Sexual Activity: No   Other Topics Concern  . Not on file   Social History Narrative  . No narrative on file    REVIEW OF SYSTEMS: Constitutional: Fatigued.  No fevers, chills, sweats or change in appetite Eyes: No visual changes, double vision, eye pain Ear, nose and throat: No hearing loss, ear pain, nasal congestion, sore throat Cardiovascular: No chest pain, palpitations Respiratory:  No shortness of breath at rest or with exertion, wheezes GastrointestinaI: No nausea, vomiting, diarrhea, abdominal pain, fecal incontinence Genitourinary:  No dysuria, urinary retention or frequency Musculoskeletal:  Neck pain Integumentary: No rash, pruritus, skin lesions Neurological: as above Psychiatric: No depression, insomnia, anxiety Endocrine: No palpitations, fatigue, diaphoresis, mood swings, change in appetite, change in weight, increased thirst Hematologic/Lymphatic:  No anemia, purpura, petechiae. Allergic/Immunologic: no itchy/runny eyes, nasal congestion, recent allergic reactions, rashes  PHYSICAL EXAM: Filed Vitals:   06/06/13 1034  BP: 80/46  Pulse: 60  Temp: 97 F (36.1 C)  Resp: 18   General: No acute distress Head:  Normocephalic/atraumatic Neck: supple, no paraspinal tenderness, full range of motion Heart:  Regular rate and rhythm Lungs:  Clear to auscultation bilaterally Back: No paraspinal tenderness Neurological Exam: alert and oriented to person, place, and time (although got the date wrong by one day). Able to name, repeat, write, read and follow 3 step commands across midline.  Able to spell WORLD backwards.  Attention span and  concentration intact, recent and remote memory intact (recalled 3 of 3 words), fund of knowledge intact.  Minor inconsistencies when copying intersecting pentagons, but nothing significant.  MMSE 28/30.  Speech slowed but fluent and not dysarthric, language intact.  CN II-XII intact.  Fundi not visualized.  Bulk and tone normal.  Left shoulder strength limited secondary to pain due to rotator cuff problems.  Otherwise 5/5 throughout. Reduced pinprick sensation to left arm and leg. DTR 1+ throughout, except absent at ankles.  Finger to nose slowed but no dysmetria.  Wide-based gait.  IMPRESSION: 1.  Chronic headache, multifactorial, improved. 2.  History of complex partial seizures, controlled. 3.  History of stroke. 4.  Polypharmacy 5.  Chronic pain. 6.  History of meningioma resection. 7.  Hypotension and hyperglycemia  PLAN: 1.  Regarding hypotension and hyperglycemia, we contacted Dr. Griffin Dakin (her PCP) office.  [name] was out of the office and they recommended she go to the ED for evaluation, which se agreed to do.  She was escorted to the car and being driven over there. 2.  Repeat MRI of the brain with and without contrast to follow up for stability of meningioma resection. 3.  Continue ASA 81mg , statin therapy, glycemic control.  Follow up fasting Lipid  panel and Hgb A1c (LDL goal should be less than 70). 4.  Continue Lamictal 100mg  daily 5.  Headaches treated as per [place].  Metta Clines, DO  CC:  Tula Nakayama, MD

## 2013-06-06 NOTE — ED Provider Notes (Signed)
CSN: [id number]     Arrival date & time 06/06/13  1200 History   First MD Initiated Contact with Patient 06/06/13 1303     Chief Complaint  Patient presents with  . Hyperglycemia     (Consider location/radiation/quality/duration/timing/severity/associated sxs/prior Treatment) HPI Patient reports she woke up this morning and she checked her CBG which was 488 and her blood pressure which was 131/49. She states because her blood pressure was low she did not take her blood pressure medicine this morning. She states the only medicine she took was her baby aspirin and her insulin and her Zoloft and her lamictal. She states "I always feel heavy and strange" when she gets up in the morning. She states her doctors always comment that she looks tired. she was seen by Dr. Thurnell Lose, her dermatologist 3 days ago and was told "you look like a zombie". She states her pulmonologist [name] states "you always look tired". She states today she was at her neurologist office in [place] and they noted she had a low blood pressure. They called her PCP who couldn't see her in the office. They told her to come to the ED. Patient states she feels heavy like "someone is controlling me or weighing me down". She initially told me this feeling has been going on for a few weeks and then she told me it only happened today. She states her Lopressor has been increased to 50 mg. Her clonidine was increased from twice a day to 3 times a day, she states she takes 0.3 mg. She however has not taken it today. She denies having depression, suicidal or homicidal ideation. She has chronic nausea. She denies any vomiting. She denies diarrhea, cough, chest pain, shortness of breath she denies dizziness, or diaphoresis.. She states she does have some dyspnea on exertion. She has had some sinus congestion but is better now. She denies any numbness in her extremities. She states she has chronic weakness in her left upper extremity which has been  evaluated by Dr. Sherwood Gambler and Dr. Aline Brochure. She states her MRI shows she has torn tendons. Patient states she has had a stroke and has some mild right-sided weakness. Patient is noted to have very slow speech which she states she's had since she had her strokes. She states she doesn't feel sleepy however when she goes to church or goes out to the doctor's office she falls to sleep. We discussed several of her medications are sedating.  PCP Dr Moshe Cipro Pain Management Dr Nelva Bush just seen 2 days ago  Pulmonary [name] Dermatologist Dr. Karlyne Greenspan Neurologist Dr. Tomi Likens Endocrinologist Dr. Dorris Fetch  Past Medical History  Diagnosis Date  . Bipolar disorder   . CVA (cerebral infarction)   . Pancreatitis 2006    due to Depakote with normal EUS   . Osteoporosis   . Chronic back pain   . Diabetes mellitus   . Trigger finger   . Anxiety disorder   . Hypertension   . Migraines     chronic headaches  . Diverticulosis     TCS 9/08 by Dr. Delfin Edis for diarrhea . Bx for micro scopic colitis negative.   . Schatzki's ring     non critical / EGD with ED 8/2011with RMR  . S/P colonoscopy [id number], 2011    left-sided diverticula, hx of simple adenomas . 2011, random bx negative for microscopic colitis  . Glaucoma   . Allergic rhinitis   . Hypothyroidism     thyroid goiter  .  Anemia   . Blood transfusion   . GERD (gastroesophageal reflux disease)   . Stroke     left sided weakness  . Seizures     unknown etiology-on meds-last seizure was 3 yerars ago  . Anxiety   . Depression   . Metabolic encephalopathy [id number]  . Sleep apnea     on CPAP  . Arthritis   . Gum symptoms     infection on antibiotic  . Chronic neck pain   . Mononeuritis lower limb   . Frequent falls    Past Surgical History  Procedure Laterality Date  . Abdominal hysterectomy    . Cholecystectomy    . Ovarian cyst removal    . Carpal tunnel release  07/22/04    left/ Dr. Aline Brochure   . Breast reduction surgery    .  Bilateral cataract surgery    . Biopsy of thyroid gland  2009  . Surgical excision of 3 tumors from right thigh and right buttock  and left upper thigh  2010  . Back surgery  July 2012  . Spine surgery  09/29/2010    dr brooks  . Maloney dilation  12/29/2010    RMR;  . Esophagogastroduodenoscopy  12/29/2010    Rourk-Retained food in the esophagus and stomach, small hiatal hernia, status post Maloney dilation of the esophagus  . Craniotomy  11/23/2011    Procedure: CRANIOTOMY TUMOR EXCISION;  Surgeon: Hosie Spangle, MD;  Location: [facility];  Service: Neurosurgery;  Laterality: N/A;  Craniotomy for tumor resection  . Brain surgery  11/2011    resection of meningioma  . Colonoscopy N/A 09/25/2012    [id number] diverticulosis.  colonic polyp-removed : tubular adenoma  . 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 Hiatal hernia  . Givens capsule study N/A 01/15/2013    NORMAL.   . Bacterial overgrowth test N/A 05/05/2013    Procedure: BACTERIAL OVERGROWTH TEST;  Surgeon: Daneil Dolin, MD;  Location: [facility];  Service: Endoscopy;  Laterality: N/A;  7:30   Family History  Problem Relation Age of Onset  . Heart attack Mother   . Pneumonia Father   . Kidney failure Father   . Cancer Sister     pancreatic  . Diabetes Brother   . Hypertension Brother   . Colon cancer Neg Hx   . Anesthesia problems Neg Hx   . Hypotension Neg Hx   . Malignant hyperthermia Neg Hx   . Pseudochol deficiency Neg Hx    History  Substance Use Topics  . Smoking status: Current Some Day Smoker -- 0.25 packs/day for 7 years    Types: Cigarettes    Last Attempt to Quit: 09/22/2012  . Smokeless tobacco: Never Used     Comment: started at 24 smoked 6 yrs and stopped. restarted 2 months ago  . Alcohol Use: No     Comment: 5 cigs/day    Lives at home Lives alone  Uses a cane  OB History   Grav Para Term Preterm Abortions TAB  SAB Ect Mult Living   6 1 1  5  5         Review of Systems  All other systems reviewed and are negative.      Allergies  Cephalexin; Iron; Milk-related compounds; Penicillins; and Phenazopyridine hcl  Home Medications   Current Outpatient Rx  Name  Route  Sig  Dispense  Refill  . aspirin EC 81  MG tablet   Oral   Take 81 mg by mouth daily.         . B Complex-Biotin-FA (B-COMPLEX PO)   Oral   Take 1 tablet by mouth daily.         . clobetasol (TEMOVATE) 0.05 % external solution   Topical   Apply 1 application topically 2 (two) times daily.          . cloNIDine (CATAPRES) 0.3 MG tablet   Oral   Take 0.3 mg by mouth every 8 (eight) hours. 8am;4pm;27midnight         . diclofenac sodium (VOLTAREN) 1 % GEL   Topical   Apply 2 g topically daily as needed (Pain).         [name] diphenoxylate-atropine (LOMOTIL) 2.5-0.025 MG per tablet   Oral   Take 1 tablet by mouth 2 (two) times daily as needed for diarrhea or loose stools.   30 tablet   0   . fluticasone (FLONASE) 50 MCG/ACT nasal spray   Each Nare   Place 1 spray into both nostrils daily. Allergies   16 g   3   . hydrochlorothiazide (MICROZIDE) 12.5 MG capsule   Oral   Take 12.5 mg by mouth daily.         . hydroxychloroquine (PLAQUENIL) 200 MG tablet   Oral   Take 200 mg by mouth daily.         . insulin glargine (LANTUS) 100 UNIT/ML injection   Subcutaneous   Inject 30 Units into the skin daily.         [name] lamoTRIgine (LAMICTAL) 100 MG tablet   Oral   Take 150 mg by mouth every morning.          [name] levothyroxine (LEVOXYL) 50 MCG tablet   Oral   Take 50 mcg by mouth every morning.          . Linaclotide (LINZESS) 290 MCG CAPS capsule   Oral   Take 290 mcg by mouth daily.         [name] LORazepam (ATIVAN) 0.5 MG tablet   Oral   Take 0.5 mg by mouth 2 (two) times daily.          [name] losartan (COZAAR) 100 MG tablet   Oral   Take 1 tablet (100 mg total) by mouth daily.   30 tablet    11     Dose increase effective 02/17/2013   . metoprolol (LOPRESSOR) 50 MG tablet   Oral   Take 1 tablet (50 mg total) by mouth 2 (two) times daily.   60 tablet   5   . mirtazapine (REMERON) 30 MG tablet   Oral   Take 30 mg by mouth at bedtime.          . montelukast (SINGULAIR) 10 MG tablet   Oral   Take 10 mg by mouth daily.         . Multiple Vitamins-Minerals (ONE-A-DAY 50 PLUS PO)   Oral   Take 1 tablet by mouth daily.         . ondansetron (ZOFRAN) 4 MG tablet   Oral   Take 1 tablet (4 mg total) by mouth 3 (three) times daily with meals.   90 tablet   3   . ONGLYZA 5 MG TABS tablet   Oral   Take 5 mg by mouth daily.         [name] oxyCODONE-acetaminophen (PERCOCET) 10-325 MG per tablet  Oral   Take 1 tablet by mouth 3 (three) times daily as needed for pain.          . pregabalin (LYRICA) 75 MG capsule   Oral   Take 75 mg by mouth 2 (two) times daily.          . Probiotic Product (PROBIOTIC FORMULA PO)   Oral   Take 1 capsule by mouth daily.         . promethazine (PHENERGAN) 12.5 MG tablet   Oral   Take 1 tablet (12.5 mg total) by mouth every 6 (six) hours as needed. FOR NAUSEA AND VOMITING   30 tablet   0   . RABEprazole (ACIPHEX) 20 MG tablet   Oral   Take 20 mg by mouth daily.         . rosuvastatin (CRESTOR) 5 MG tablet   Oral   Take 1 tablet (5 mg total) by mouth at bedtime.   30 tablet   11     Dose reduction effective 06/14/2012   . sertraline (ZOLOFT) 100 MG tablet   Oral   Take 100 mg by mouth at bedtime.          [name] thioridazine (MELLARIL) 10 MG tablet   Oral   Take 20 mg by mouth at bedtime.          . traZODone (DESYREL) 50 MG tablet   Oral   Take 50 mg by mouth at bedtime.          . Vitamin D, Ergocalciferol, (DRISDOL) 50000 UNITS CAPS capsule   Oral   Take 50,000 Units by mouth every 7 (seven) days. Sunday          ED Triage Vitals  Enc Vitals Group     BP 06/06/13 1223 117/56 mmHg     Pulse Rate  06/06/13 1223 66     Resp 06/06/13 1223 18     Temp 06/06/13 1223 98.9 F (37.2 C)     Temp src 06/06/13 1223 Oral     SpO2 06/06/13 1223 99 %     Weight 06/06/13 1223 161 lb (73.029 kg)     Height 06/06/13 1223 5\' 2"  (1.575 m)     Head Cir --      Peak Flow --      Pain Score 06/06/13 1221 7     Pain Loc --      Pain Edu? --      Excl. in [place]? --      Vital signs normal except for hypotension at 10:34 am of 80/46  Orthostatic vital signs done in the ED are not significant for orthostasis  Physical Exam  Nursing note and vitals reviewed. Constitutional: She is oriented to person, place, and time. She appears well-developed and well-nourished.  Non-toxic appearance. She does not appear ill. No distress.  HENT:  Head: Normocephalic and atraumatic.  Right Ear: External ear normal.  Left Ear: External ear normal.  Nose: Nose normal. No mucosal edema or rhinorrhea.  Mouth/Throat: Mucous membranes are normal. No dental abscesses or uvula swelling.  Dry tongue  Eyes: Conjunctivae and EOM are normal. Pupils are equal, round, and reactive to light.  Neck: Normal range of motion and full passive range of motion without pain. Neck supple.  Cardiovascular: Normal rate, regular rhythm and normal heart sounds.  Exam reveals no gallop and no friction rub.   No murmur heard. Pulmonary/Chest: Effort normal and breath sounds normal. No respiratory distress. She has  no wheezes. She has no rhonchi. She has no rales. She exhibits no tenderness and no crepitus.  Abdominal: Soft. Normal appearance and bowel sounds are normal. She exhibits no distension. There is no tenderness. There is no rebound and no guarding.  Musculoskeletal: Normal range of motion. She exhibits no edema and no tenderness.  Moves all extremities well.   Neurological: She is alert and oriented to person, place, and time. She has normal strength. No cranial nerve deficit.  Skin: Skin is warm, dry and intact. No rash noted. No  erythema. No pallor.  Psychiatric: Her mood appears not anxious. Her affect is blunt. Her speech is delayed. She is slowed.  Speech is very slow    ED Course  Procedures (including critical care time)  Medications  sodium chloride 0.9 % bolus 1,000 mL (1,000 mLs Intravenous New Bag/Given 06/06/13 1626)   Patient given IV fluids for her dehydration. We discussed that several of her medications are sedating. She can discuss this with her primary care doctor and specialist. She was no longer hypotensive.  Patient has chronic nausea which may account for her dehydration. She is on many medications that are sedating. Specifically the clonidine, metoprolol, Singulair, Percocet. She needs to discuss her medications with her PCP. She has no other acute medical problem today to account for her hypertension and her other doctor's office this morning.   Labs Review Results for orders placed during the hospital encounter of 06/06/13  CBC WITH DIFFERENTIAL      Result Value Ref Range   WBC 6.2  4.0 - 10.5 K/uL   RBC 4.64  3.87 - 5.11 MIL/uL   Hemoglobin 10.5 (*) 12.0 - 15.0 g/dL   HCT 82.4 (*) 23.5 - 36.1 %   MCV 74.1 (*) 78.0 - 100.0 fL   MCH 22.6 (*) 26.0 - 34.0 pg   MCHC 30.5  30.0 - 36.0 g/dL   RDW 44.3  15.4 - 00.8 %   Platelets 197  150 - 400 K/uL   Neutrophils Relative % 46  43 - 77 %   Lymphocytes Relative 46  12 - 46 %   Monocytes Relative 6  3 - 12 %   Eosinophils Relative 2  0 - 5 %   Basophils Relative 0  0 - 1 %   Neutro Abs 2.8  1.7 - 7.7 K/uL   Lymphs Abs 2.9  0.7 - 4.0 K/uL   Monocytes Absolute 0.4  0.1 - 1.0 K/uL   Eosinophils Absolute 0.1  0.0 - 0.7 K/uL   Basophils Absolute 0.0  0.0 - 0.1 K/uL   Smear Review MORPHOLOGY UNREMARKABLE    COMPREHENSIVE METABOLIC PANEL      Result Value Ref Range   Sodium 144  137 - 147 mEq/L   Potassium 4.3  3.7 - 5.3 mEq/L   Chloride 107  96 - 112 mEq/L   CO2 26  19 - 32 mEq/L   Glucose, Bld 208 (*) 70 - 99 mg/dL   BUN 24 (*) 6 - 23  mg/dL   Creatinine, Ser 6.76  0.50 - 1.10 mg/dL   Calcium 9.9  8.4 - 19.5 mg/dL   Total Protein 7.2  6.0 - 8.3 g/dL   Albumin 3.3 (*) 3.5 - 5.2 g/dL   AST 16  0 - 37 U/L   ALT 17  0 - 35 U/L   Alkaline Phosphatase 162 (*) 39 - 117 U/L   Total Bilirubin 0.2 (*) 0.3 - 1.2 mg/dL  GFR calc non Af Amer 89 (*) >90 mL/min   GFR calc Af Amer >90  >90 mL/min  TROPONIN I      Result Value Ref Range   Troponin I <0.30  <0.30 ng/mL  URINALYSIS, ROUTINE W REFLEX MICROSCOPIC      Result Value Ref Range   Color, Urine YELLOW  YELLOW   APPearance CLEAR  CLEAR   Specific Gravity, Urine >1.030 (*) 1.005 - 1.030   pH 6.0  5.0 - 8.0   Glucose, UA 100 (*) NEGATIVE mg/dL   Hgb urine dipstick NEGATIVE  NEGATIVE   Bilirubin Urine NEGATIVE  NEGATIVE   Ketones, ur NEGATIVE  NEGATIVE mg/dL   Protein, ur NEGATIVE  NEGATIVE mg/dL   Urobilinogen, UA 0.2  0.0 - 1.0 mg/dL   Nitrite NEGATIVE  NEGATIVE   Leukocytes, UA NEGATIVE  NEGATIVE  CBG MONITORING, ED      Result Value Ref Range   Glucose-Capillary 248 (*) 70 - 99 mg/dL      *Note: Due to a large number of results for the requested time period, some results have not been displayed. A complete set of results can be found in Results Review.   Laboratory interpretation all normal except concentrated urine, hyperglycemia, stable anemia    Imaging Review Ct Head Wo Contrast  06/06/2013   CLINICAL DATA:  Weakness.  EXAM: CT HEAD WITHOUT CONTRAST  TECHNIQUE: Contiguous axial images were obtained from the base of the skull through the vertex without intravenous contrast.  COMPARISON:  CT scan of November 25, 2012.  FINDINGS: Status post frontal craniotomy. No acute abnormality seen in the bony calvarium. Stable left frontal encephalomalacia consistent with prior surgery. No mass effect or midline shift is noted. Ventricular size is within normal limits. There is no evidence of mass lesion, hemorrhage or acute infarction.  IMPRESSION: No acute intracranial  abnormality seen.   Electronically Signed   By: Sabino Dick M.D.   On: 06/06/2013 15:04   Dg Chest Portable 1 View  06/06/2013   CLINICAL DATA:  Weakness  EXAM: PORTABLE CHEST - 1 VIEW  COMPARISON:  None.  FINDINGS: Low lung volumes. The heart size and mediastinal contours are within normal limits. Both lungs are clear. The visualized skeletal structures are unremarkable.  IMPRESSION: No active disease.   Electronically Signed   By: Margaree Mackintosh M.D.   On: 06/06/2013 14:08     EKG Interpretation   Date/Time:  Friday June 06 2013 14:26:08 EDT Ventricular Rate:  66 PR Interval:  180 QRS Duration: 88 QT Interval:  400 QTC Calculation: 419 R Axis:   53 Text Interpretation:  Normal sinus rhythm Normal ECG When compared with  ECG of 05-Mar-2013 16:11, No significant change was found Confirmed by  Danaja Lasota  MD-I, Anjelique Makar ([id number]) on 06/06/2013 3:14:17 PM      MDM   Final diagnoses:  Hypotension  Dehydration  Excessive sleepiness    Plan discharge  Rolland Porter, MD, Abram Sander     Janice Norrie, MD 06/06/13 [phone]

## 2013-06-06 NOTE — ED Notes (Signed)
Pt returned from radiology with Tiffany Kocher

## 2013-06-06 NOTE — Discharge Instructions (Signed)
Try to drink more fluids. Follow-up with Dr Moshe Cipro to discuss your medications, many of your medications are sedating (make your sleepy). Recheck as needed.

## 2013-06-06 NOTE — ED Notes (Signed)
Dr. Tomi Bamberger made aware of Orthostatic vs prior to dc home.

## 2013-06-06 NOTE — Patient Instructions (Signed)
1.  Because your blood sugars are so high and your blood pressure is low, we will have you go to the ED for evaluation. 2.  We will order a follow up MRI of the brain to make sure everything is stable 3.  I will refill your Lamictal. 4.  Follow up in 6 months.  Follow up with [name] as soon as possible.

## 2013-06-06 NOTE — ED Notes (Signed)
nad noted prior to dc dinner tray given to pt prior to dc.

## 2013-06-06 NOTE — ED Notes (Signed)
Pt say bp was 80/46 at md office, and told to come to ER . Seen by Neurologist today.  CBG this morning was over 400,  At MD office cbg 250.  ,ambulatory with cane.Seems drownsy

## 2013-06-06 NOTE — ED Notes (Signed)
Patient denies any dizziness or difficulty standing.

## 2013-06-07 NOTE — Assessment & Plan Note (Signed)
Question medication effect causing apparent drowsiness

## 2013-06-07 NOTE — Assessment & Plan Note (Signed)
Her actual status is very unclear. I'm concerned about apparent sleepiness-primary hypersomnia versus drug effect Plan-NPSG update, followed by MSLT on meds, since we can't get her off. Interpretation of the daytime study will be limited because of her meds.

## 2013-06-10 ENCOUNTER — Ambulatory Visit: Payer: Medicare HMO | Admitting: Gastroenterology

## 2013-06-16 DIAGNOSIS — IMO0001 Reserved for inherently not codable concepts without codable children: Secondary | ICD-10-CM

## 2013-06-16 DIAGNOSIS — I129 Hypertensive chronic kidney disease with stage 1 through stage 4 chronic kidney disease, or unspecified chronic kidney disease: Secondary | ICD-10-CM

## 2013-06-16 DIAGNOSIS — M549 Dorsalgia, unspecified: Secondary | ICD-10-CM

## 2013-06-16 DIAGNOSIS — N189 Chronic kidney disease, unspecified: Secondary | ICD-10-CM

## 2013-06-16 DIAGNOSIS — E1165 Type 2 diabetes mellitus with hyperglycemia: Secondary | ICD-10-CM

## 2013-06-17 ENCOUNTER — Ambulatory Visit (HOSPITAL_COMMUNITY): Payer: Medicare HMO

## 2013-06-17 IMAGING — CR DG CHEST 1V PORT
1 series · 1 of 1 positions shown · non-contrast
Comparison: 10/18/2011 and earlier.

CLINICAL DATA: 61-year-old female status post right IJ placement.
Postop.

PORTABLE CHEST - 1 VIEW

[view not recorded]
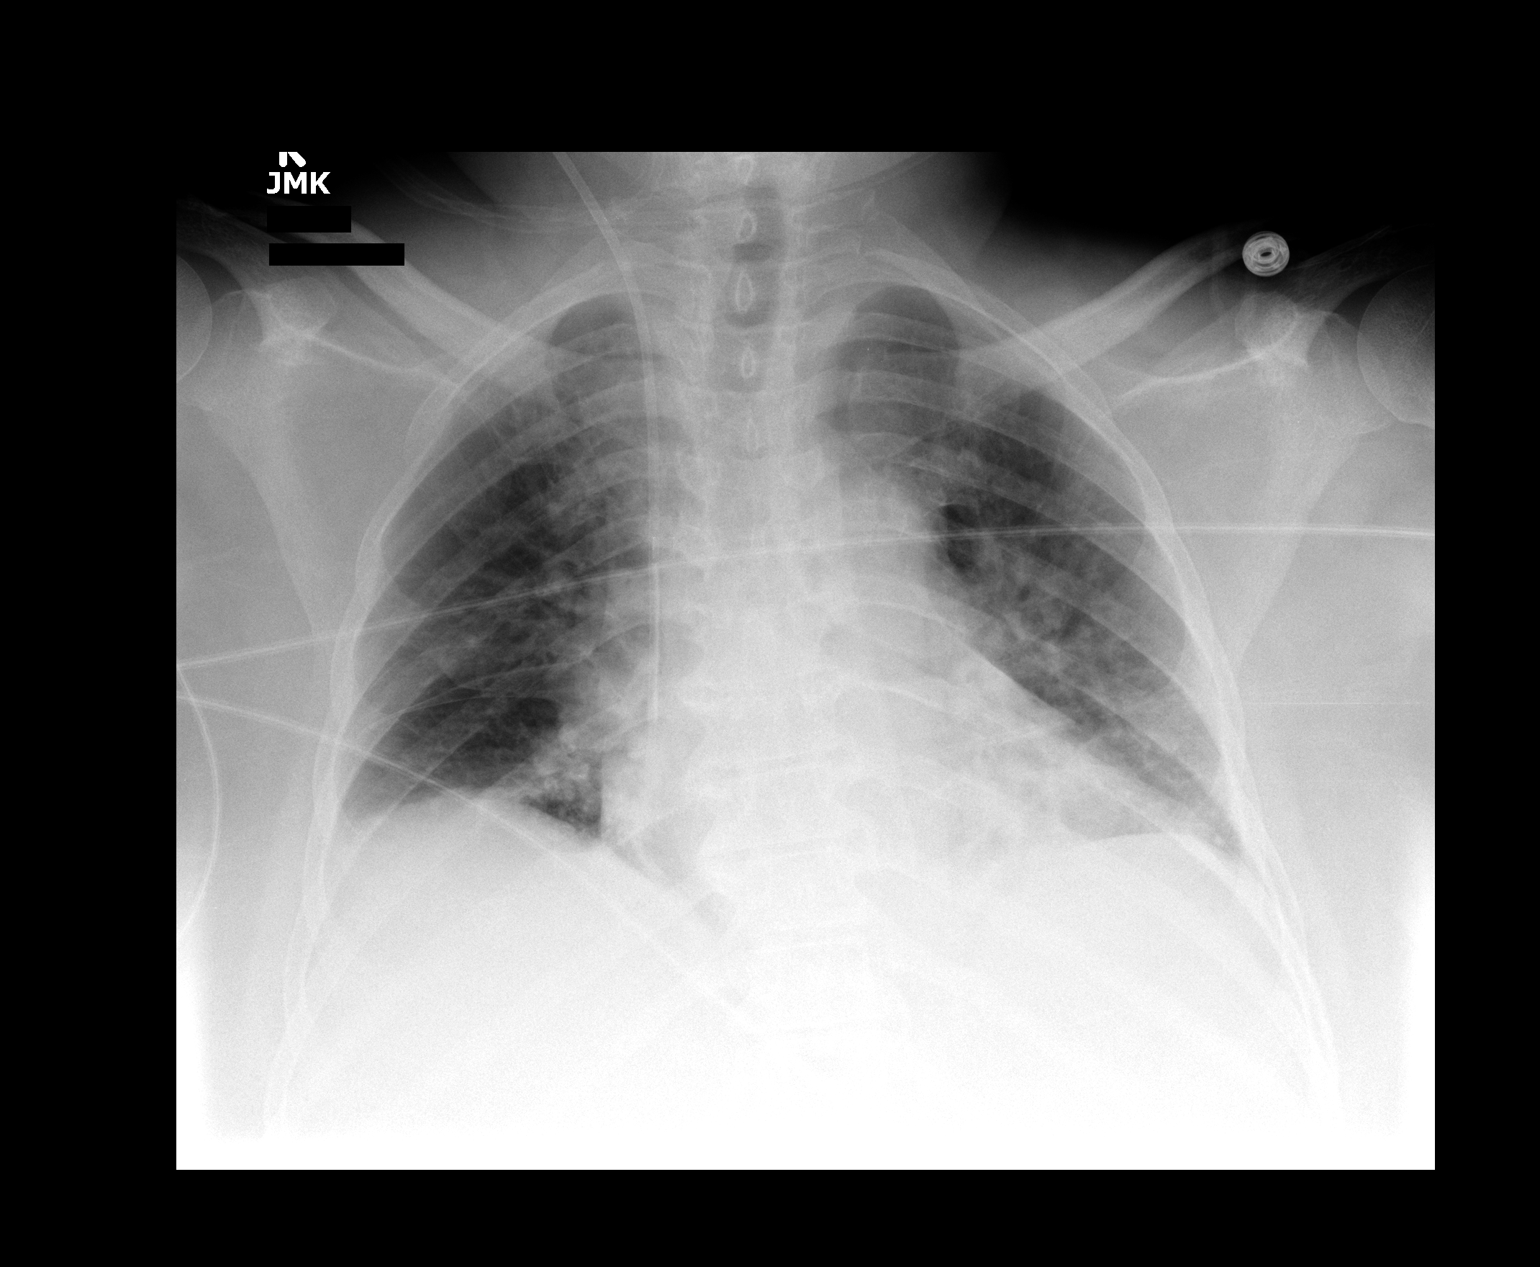

[1 of 1 positions shown; findings below may reference images not displayed]

FINDINGS: Portable AP view 1558 hours.
Right IJ approach central line tip projects to the level of the
level of the cavoatrial junction.  Lower lung volumes.  No
pneumothorax.  Cardiac size and mediastinal contours are within
normal limits.  Visualized tracheal air column is within normal
limits.  Perihilar and basilar atelectasis.  No pulmonary edema,
pleural effusion or consolidation evident.
IMPRESSION: 1.  Right IJ central line tip at the lower SVC level.
2.  Low lung volumes with atelectasis.  No pneumothorax.

## 2013-06-18 ENCOUNTER — Telehealth: Payer: Self-pay

## 2013-06-18 ENCOUNTER — Other Ambulatory Visit: Payer: Self-pay | Admitting: *Deleted

## 2013-06-18 ENCOUNTER — Ambulatory Visit (HOSPITAL_COMMUNITY)
Admission: RE | Admit: 2013-06-18 | Discharge: 2013-06-18 | Disposition: A | Discharge status: Home / Self Care | Payer: Medicare HMO | Source: Ambulatory Visit | Attending: Neurology | Admitting: Neurology

## 2013-06-18 ENCOUNTER — Other Ambulatory Visit (HOSPITAL_COMMUNITY): Payer: Medicare HMO

## 2013-06-18 ENCOUNTER — Other Ambulatory Visit: Payer: Self-pay | Admitting: Family Medicine

## 2013-06-18 DIAGNOSIS — C700 Malignant neoplasm of cerebral meninges: Secondary | ICD-10-CM | POA: Diagnosis not present

## 2013-06-18 DIAGNOSIS — R569 Unspecified convulsions: Secondary | ICD-10-CM | POA: Insufficient documentation

## 2013-06-18 MED ORDER — METOPROLOL TARTRATE 50 MG PO TABS: 50.0000 mg | ORAL_TABLET | Freq: Two times a day (BID) | ORAL | Status: DC

## 2013-06-18 MED ORDER — GADOBENATE DIMEGLUMINE 529 MG/ML IV SOLN
20.0000 mL | Freq: Once | INTRAVENOUS | Status: AC | PRN
  Administered 2013-06-18: 20 mL via INTRAVENOUS

## 2013-06-18 NOTE — Telephone Encounter (Signed)
Letter composed and patient aware to come collect.

## 2013-06-18 NOTE — Telephone Encounter (Signed)
Rx refill sent to patients pharmacy  

## 2013-06-19 NOTE — Telephone Encounter (Signed)
Message copied by Claudie Revering on Thu Jun 19, 2013  8:18 AM ------      Message from: JAFFE, ADAM R      Created: Thu Jun 19, 2013  5:59 AM       MRI looks okay.  Everything is stable.  No recurrent meningioma      ----- Message -----         From: Rad Results In Interface         Sent: 06/18/2013   4:32 PM           To: Dudley Major, DO                   ------

## 2013-06-19 NOTE — Telephone Encounter (Signed)
Patient is aware of normal MRI 

## 2013-06-23 ENCOUNTER — Telehealth: Payer: Self-pay | Admitting: Family Medicine

## 2013-06-23 NOTE — Telephone Encounter (Signed)
Loma Sousa spoke with patient. The [facility] provided is for [facility] and patient already receives [facility]. Loma Sousa told patient to bring [name] more information on the services she wanted

## 2013-06-25 ENCOUNTER — Telehealth: Payer: Self-pay | Admitting: Family Medicine

## 2013-06-26 NOTE — Telephone Encounter (Signed)
Called [facility] and they said they didn't do that and especially if she was a patient of [facility]. Pt aware and told her to bring the information she had when she comes in next regarding this

## 2013-06-26 NOTE — Telephone Encounter (Signed)
Called back and spoke with patient.

## 2013-06-29 ENCOUNTER — Ambulatory Visit (HOSPITAL_BASED_OUTPATIENT_CLINIC_OR_DEPARTMENT_OTHER): Payer: Medicare HMO | Attending: Internal Medicine | Admitting: Radiology

## 2013-06-29 VITALS — Ht 62.0 in | Wt 160.0 lb

## 2013-06-29 DIAGNOSIS — G4733 Obstructive sleep apnea (adult) (pediatric): Secondary | ICD-10-CM | POA: Insufficient documentation

## 2013-06-29 DIAGNOSIS — G4710 Hypersomnia, unspecified: Secondary | ICD-10-CM | POA: Diagnosis not present

## 2013-06-30 ENCOUNTER — Ambulatory Visit (HOSPITAL_BASED_OUTPATIENT_CLINIC_OR_DEPARTMENT_OTHER): Payer: Medicare HMO | Attending: Internal Medicine | Admitting: Radiology

## 2013-06-30 DIAGNOSIS — R404 Transient alteration of awareness: Secondary | ICD-10-CM | POA: Insufficient documentation

## 2013-06-30 DIAGNOSIS — G4710 Hypersomnia, unspecified: Secondary | ICD-10-CM | POA: Insufficient documentation

## 2013-06-30 DIAGNOSIS — G4733 Obstructive sleep apnea (adult) (pediatric): Secondary | ICD-10-CM | POA: Insufficient documentation

## 2013-07-01 ENCOUNTER — Ambulatory Visit: Payer: Medicare HMO | Admitting: Internal Medicine

## 2013-07-05 DIAGNOSIS — G4733 Obstructive sleep apnea (adult) (pediatric): Secondary | ICD-10-CM

## 2013-07-05 DIAGNOSIS — G4710 Hypersomnia, unspecified: Secondary | ICD-10-CM

## 2013-07-05 NOTE — Sleep Study (Signed)
   NAME: [name] DATE OF BIRTH:  25-Feb-1951 MEDICAL RECORD NUMBER [id number]  LOCATION: [facility]  PHYSICIAN: Darlina Mccaughey D Natina Wiginton  DATE OF STUDY: 06/29/2013  SLEEP STUDY TYPE: Nocturnal Polysomnogram               REFERRING PHYSICIAN: Baird Lyons D, MD  INDICATION FOR STUDY: Hypersomnia with sleep apnea  EPWORTH SLEEPINESS SCORE:   14/24 HEIGHT: 5\' 2"  (157.5 cm)  WEIGHT: 160 lb (72.576 kg)    Body mass index is 29.26 kg/(m^2).  NECK SIZE: 14.5 in.  MEDICATIONS: Charted for review  SLEEP ARCHITECTURE: Total sleep time 402 minutes with sleep efficiency 97%. Stage I was 10%, stage II 84.7%, stage III absent, REM 5.3% of total sleep time. Sleep latency 3.5 minutes, REM latency 72 minutes, awake after sleep onset 9.5 minutes, arousal index 9.6. Bedtime medication: Mirtazapine, Tradienta, trazodone, Mellaril, Lyrica, Crestor, Catapres  RESPIRATORY DATA: Apnea hypopneas index (AHI) 8.4 per hour. 56 total events scored including 39 obstructive apneas, 3 central apneas, 1 mixed apnea, 13 hypopneas. Events were not positional. REM AHI 16.7 per hour. There were not enough early events to permit application of split protocol CPAP titration.  OXYGEN DATA: Mild snoring with oxygen desaturation to a nadir of 88% and mean oxygen saturation through the study at 93.4% on room air.  CARDIAC DATA: Normal sinus rhythm  MOVEMENT/PARASOMNIA: No significant movement disturbance, no bathroom trips  IMPRESSION/ RECOMMENDATION:  1) Mild obstructive sleep apnea/hypopnea syndrome, AHI 8.4 per hour with non-positional events. REM AHI 16.7 per hour. Mild snoring with oxygen desaturation to a nadir of 88% and mean oxygen saturation through the study of 93.4% on room air.  2) There were not enough respiratory events to meet protocol requirements for split CPAP titration. 3) A previous polysomnogram on 05/19/2011 in [place] recorded AHI of 14 per hour with BMI for that study 30. 4) See  record of multiple sleep latency test performed following this current study.  [place], [employer] adviser of Sleep Medicine  ELECTRONICALLY SIGNED ON:  07/05/2013, 12:21 PM [place] PH: [phone]   FX: [phone] [place]

## 2013-07-05 NOTE — Sleep Study (Signed)
    NAME: [name] DATE OF BIRTH:  1951-03-04 MEDICAL RECORD NUMBER [id number]  LOCATION: [facility]  PHYSICIAN: Maxcine Strong D Ariyon Gerstenberger  DATE OF STUDY: 06/30/2013  SLEEP STUDY TYPE: Multiple Sleep Latency Test (following NPSG)               REFERRING PHYSICIAN: Baird Lyons D, MD  INDICATION FOR STUDY: Hypersomnia with suspected narcolepsy  EPWORTH SLEEPINESS SCORE:   14/24 HEIGHT:   5 feet 2 inches WEIGHT:   164 pounds   BMI 29 NECK SIZE:   14.5 in.  MEDICATIONS: Charted for review  NAP 1: 8 AM  sleep latency 0.5 minutes, REM latency N/A  NAP 2: 10 AM  sleep latency 0.5 minutes, REM latency N/A  NAP 3: 12 noon  sleep latency 0, REM latency N/A  NAP 4: 14:00PM  sleep latency 0, REM latency N/A  NAP 5: 16:00  sleep latency 3 minutes, REM latency N/A  MEAN SLEEP LATENCY: 0.8 minutes  NUMBER OF REM EPISODES:  None, 0/5 naps  COMMENTS:  NPSG done the night before, demonstrating mild obstructive sleep apnea, AHI 8.4 per hour. Total sleep time was 402 minutes with sleep efficiency 97% and stage REM 5.3%. Bedtime medication had included Mirtazipine, trazodone, Mellaril, Lyrica and Tradjenta.  IMPRESSION/ RECOMMENDATION:   1) Pathological hypersomnia with mean sleep latency 0.8 minutes. Loud snoring was noted with each nap. No REM events were recorded. 2) A formal diagnosis of narcolepsy or idiopathic hypersomnia cannot be made because of the bedtime medications taken the night before. Diminished time in REM may be a medication effect, but would not be characteristic of narcolepsy. 3) This degree of hypersomnia might be a side effect of medications, but is more severe than would be expected. This suggests there is an additional diagnosis of hypersomnia, complicated by medication side effects and mild sleep apnea.  Signed Baird Lyons M.D. [facility], [facility]  ELECTRONICALLY SIGNED ON:  07/05/2013, 12:30 PM [place] PH: [phone]   FX: [phone] [place]

## 2013-07-06 DIAGNOSIS — G4710 Hypersomnia, unspecified: Secondary | ICD-10-CM

## 2013-07-06 DIAGNOSIS — G4733 Obstructive sleep apnea (adult) (pediatric): Secondary | ICD-10-CM

## 2013-07-09 ENCOUNTER — Ambulatory Visit (INDEPENDENT_AMBULATORY_CARE_PROVIDER_SITE_OTHER): Payer: Medicare HMO | Admitting: Gastroenterology

## 2013-07-09 ENCOUNTER — Encounter: Payer: Self-pay | Admitting: Gastroenterology

## 2013-07-09 VITALS — BP 184/77 | HR 72 | Temp 98.3°F | Ht 62.0 in | Wt 164.0 lb

## 2013-07-09 DIAGNOSIS — R109 Unspecified abdominal pain: Secondary | ICD-10-CM

## 2013-07-09 DIAGNOSIS — K3184 Gastroparesis: Secondary | ICD-10-CM

## 2013-07-09 DIAGNOSIS — K5900 Constipation, unspecified: Secondary | ICD-10-CM

## 2013-07-09 NOTE — Progress Notes (Signed)
Referring Provider: [name], MD Primary Care Physician:  Tula Nakayama, MD Primary GI: Dr. Gala Romney   Chief Complaint  Patient presents with  . Follow-up    HPI:   [name] presents today in routine follow-up with a history of chronic abdominal pain (CTA negative for mesenteric ischemia), constipation, gastroparesis, chronic anemia. Went to have HBT but was on Aciphex and had to cancel. Has to strain to have BM. Even if having 2-3 bowel movements a day, still feels constipated. Taking Linzess 290 mcg every morning. Hasn't seen much improvement. BM usually daily. Has to strain though. Hard stool. Took prune juice for awhile with some improvement. States she stays "so bloated". Doesn't eat dairy products often. Left-sided upper abdominal pain, every so often. Church family prayed over it. Some improvement.    Past Medical History  Diagnosis Date  . Bipolar disorder   . CVA (cerebral infarction)   . Pancreatitis 2006    due to Depakote with normal EUS   . Osteoporosis   . Chronic back pain   . Diabetes mellitus   . Trigger finger   . Anxiety disorder   . Hypertension   . Migraines     chronic headaches  . Diverticulosis     TCS 9/08 by Dr. Delfin Edis for diarrhea . Bx for micro scopic colitis negative.   . Schatzki's ring     non critical / EGD with ED 8/2011with RMR  . S/P colonoscopy [id number], 2011    left-sided diverticula, hx of simple adenomas . 2011, random bx negative for microscopic colitis  . Glaucoma   . Allergic rhinitis   . Hypothyroidism     thyroid goiter  . Anemia   . Blood transfusion   . GERD (gastroesophageal reflux disease)   . Stroke     left sided weakness  . Seizures     unknown etiology-on meds-last seizure was 3 yerars ago  . Anxiety   . Depression   . Metabolic encephalopathy 06/26/6061  . Sleep apnea     on CPAP  . Arthritis   . Gum symptoms     infection on antibiotic  . Chronic neck pain   . Mononeuritis lower limb     . Frequent falls     Past Surgical History  Procedure Laterality Date  . Abdominal hysterectomy    . Cholecystectomy    . Ovarian cyst removal    . Carpal tunnel release  07/22/04    left/ Dr. Aline Brochure   . Breast reduction surgery    . Bilateral cataract surgery    . Biopsy of thyroid gland  2009  . Surgical excision of 3 tumors from right thigh and right buttock  and left upper thigh  2010  . Back surgery  July 2012  . Spine surgery  09/29/2010    dr brooks  . Maloney dilation  12/29/2010    RMR;  . Esophagogastroduodenoscopy  12/29/2010    Rourk-Retained food in the esophagus and stomach, small hiatal hernia, status post Maloney dilation of the esophagus  . Craniotomy  11/23/2011    Procedure: CRANIOTOMY TUMOR EXCISION;  Surgeon: Hosie Spangle, MD;  Location: [facility];  Service: Neurosurgery;  Laterality: N/A;  Craniotomy for tumor resection  . Brain surgery  11/2011    resection of meningioma  . Colonoscopy N/A 09/25/2012    KZS:WFUXNAT diverticulosis.  colonic polyp-removed : tubular adenoma  . Esophagogastroduodenoscopy N/A 09/25/2012    FTD:DUKGURKY atonic baggy  esophagus status post Maloney dilation 47 F. Hiatal hernia  . Givens capsule study N/A 01/15/2013    NORMAL.   . Bacterial overgrowth test N/A 05/05/2013    Procedure: BACTERIAL OVERGROWTH TEST;  Surgeon: Daneil Dolin, MD;  Location: [facility];  Service: Endoscopy;  Laterality: N/A;  7:30    Current Outpatient Prescriptions  Medication Sig Dispense Refill  . aspirin EC 81 MG tablet Take 81 mg by mouth daily.      . B Complex-Biotin-FA (B-COMPLEX PO) Take 1 tablet by mouth daily.      . clobetasol (TEMOVATE) 0.05 % external solution Apply 1 application topically 2 (two) times daily.       . cloNIDine (CATAPRES) 0.3 MG tablet Take 0.3 mg by mouth every 8 (eight) hours. 8am;4pm;33midnight      . CRESTOR 5 MG tablet TAKE 1 TABLET BY MOUTH AT BEDTIME.  30 tablet  2  . diclofenac sodium (VOLTAREN) 1 % GEL  Apply 2 g topically daily as needed (Pain).      [name] diphenoxylate-atropine (LOMOTIL) 2.5-0.025 MG per tablet Take 1 tablet by mouth 2 (two) times daily as needed for diarrhea or loose stools.  30 tablet  0  . fluticasone (FLONASE) 50 MCG/ACT nasal spray Place 1 spray into both nostrils daily. Allergies  16 g  3  . hydrochlorothiazide (MICROZIDE) 12.5 MG capsule TAKE ONE CAPSULE DAILY.  30 capsule  2  . hydroxychloroquine (PLAQUENIL) 200 MG tablet Take 200 mg by mouth daily.      . insulin glargine (LANTUS) 100 UNIT/ML injection Inject 50 Units into the skin daily.       [name] lamoTRIgine (LAMICTAL) 100 MG tablet Take 150 mg by mouth every morning.       [name] levothyroxine (LEVOXYL) 50 MCG tablet Take 50 mcg by mouth every morning.       . Linaclotide (LINZESS) 290 MCG CAPS capsule Take 290 mcg by mouth daily.      [name] LORazepam (ATIVAN) 0.5 MG tablet Take 0.5 mg by mouth 2 (two) times daily.       [name] losartan (COZAAR) 100 MG tablet Take 1 tablet (100 mg total) by mouth daily.  30 tablet  11  . metoprolol (LOPRESSOR) 50 MG tablet Take 1 tablet (50 mg total) by mouth 2 (two) times daily.  60 tablet  6  . mirtazapine (REMERON) 30 MG tablet Take 30 mg by mouth at bedtime.       . montelukast (SINGULAIR) 10 MG tablet Take 10 mg by mouth daily.      . Multiple Vitamins-Minerals (ONE-A-DAY 50 PLUS PO) Take 1 tablet by mouth daily.      . ondansetron (ZOFRAN) 4 MG tablet Take 1 tablet (4 mg total) by mouth 3 (three) times daily with meals.  90 tablet  3  . ONGLYZA 5 MG TABS tablet Take 5 mg by mouth daily.      [name] oxyCODONE-acetaminophen (PERCOCET) 10-325 MG per tablet Take 1 tablet by mouth 3 (three) times daily as needed for pain.       . pregabalin (LYRICA) 75 MG capsule Take 75 mg by mouth 2 (two) times daily.       . Probiotic Product (PROBIOTIC FORMULA PO) Take 1 capsule by mouth daily.      . promethazine (PHENERGAN) 12.5 MG tablet Take 1 tablet (12.5 mg total) by mouth every 6 (six) hours as needed. FOR  NAUSEA AND VOMITING  30 tablet  0  . RABEprazole (ACIPHEX)  20 MG tablet Take 20 mg by mouth daily.      . sertraline (ZOLOFT) 100 MG tablet Take 100 mg by mouth at bedtime.       [name] thioridazine (MELLARIL) 10 MG tablet Take 20 mg by mouth at bedtime.       . traZODone (DESYREL) 50 MG tablet Take 50 mg by mouth at bedtime.       . Vitamin D, Ergocalciferol, (DRISDOL) 50000 UNITS CAPS capsule Take 50,000 Units by mouth every 7 (seven) days. Sunday       No current facility-administered medications for this visit.    Allergies as of 07/09/2013 - Review Complete 06/06/2013  Allergen Reaction Noted  . Cephalexin Hives   . Iron Nausea And Vomiting 11/15/2012  . Milk-related compounds Other (See Comments) 07/29/2008  . Penicillins Hives   . Phenazopyridine hcl Other (See Comments) 06/19/2007    Family History  Problem Relation Age of Onset  . Heart attack Mother   . Pneumonia Father   . Kidney failure Father   . Cancer Sister     pancreatic  . Diabetes Brother   . Hypertension Brother   . Colon cancer Neg Hx   . Anesthesia problems Neg Hx   . Hypotension Neg Hx   . Malignant hyperthermia Neg Hx   . Pseudochol deficiency Neg Hx     History   Social History  . Marital Status: Divorced    Spouse Name: N/A    Number of Children: 1  . Years of Education: N/A   Occupational History  . disabled     Social History Main Topics  . Smoking status: Current Some Day Smoker -- 0.25 packs/day for 7 years    Types: Cigarettes    Last Attempt to Quit: 09/22/2012  . Smokeless tobacco: Never Used     Comment: started at 24 smoked 6 yrs and stopped. restarted 2 months ago  . Alcohol Use: No     Comment: 5 cigs/day   . Drug Use: No  . Sexual Activity: No   Other Topics Concern  . None   Social History Narrative  . None    Review of Systems: As mentioned in HPI.   Physical Exam: BP 184/77  Pulse 72  Temp(Src) 98.3 F (36.8 C) (Oral)  Ht 5\' 2"  (1.575 m)  Wt 164 lb (74.39 kg)   BMI 29.99 kg/m2 General:   Alert and oriented. No distress noted. Pleasant and cooperative.  Head:  Normocephalic and atraumatic. Heart:  S1, S2 present without murmurs, rubs, or gallops.  Abdomen:  +BS, soft, non-tender and non-distended. No rebound or guarding. No HSM or masses noted. Msk:  Symmetrical without gross deformities. Normal posture. Extremities:  Without edema. Neurologic:  Alert and  oriented x4;  grossly normal neurologically. Skin:  Intact without significant lesions or rashes. Psych:  Alert and cooperative. Normal mood and affect.

## 2013-07-09 NOTE — Patient Instructions (Signed)
I have provided samples of Amitiza. It is important that you take this WITH FOOD to avoid nausea. Take this morning and night. If this works well for you, we will send in a prescription. Stop the Linzess while trying the Amitiza.    Return in 6-8 weeks to see Dr. Gala Romney.

## 2013-07-10 ENCOUNTER — Telehealth: Payer: Self-pay | Admitting: Gastroenterology

## 2013-07-10 MED ORDER — PROMETHAZINE HCL 12.5 MG PO TABS: 12.5000 mg | ORAL_TABLET | Freq: Four times a day (QID) | ORAL | Status: DC | PRN

## 2013-07-10 MED ORDER — DICYCLOMINE HCL 10 MG PO CAPS: 10.0000 mg | ORAL_CAPSULE | Freq: Three times a day (TID) | ORAL | Status: DC

## 2013-07-10 NOTE — Assessment & Plan Note (Signed)
At baseline. Continue PPI and anti-emetics.

## 2013-07-10 NOTE — Assessment & Plan Note (Signed)
At baseline in setting of IBS

## 2013-07-10 NOTE — Telephone Encounter (Signed)
I have sent Phenergan to pharmacy. I would like to avoid Lomotil. I sent in Bentyl to take sparingly. She has a history of IBS. Hold Linzess for now. Contact us if worsening symptoms.

## 2013-07-10 NOTE — Telephone Encounter (Signed)
Routing to AS 

## 2013-07-10 NOTE — Addendum Note (Signed)
Addended by: Orvil Feil on: 07/10/2013 12:39 PM   Modules accepted: Orders

## 2013-07-10 NOTE — Telephone Encounter (Signed)
C/O abdominal pain and cramping, having diarrhea, heaves shes asking for Lomotil and phenergan called to [facility], please advise?

## 2013-07-10 NOTE — Assessment & Plan Note (Signed)
Linzess 290 mcg without good results. Trial of Amitiza 24 mcg BID. Samples provided. Patient to call if this works well and will send rx. 6 week return.

## 2013-07-10 NOTE — Telephone Encounter (Signed)
Pt is aware.  

## 2013-07-15 NOTE — Progress Notes (Signed)
cc'd to pcp 

## 2013-07-21 ENCOUNTER — Telehealth: Payer: Self-pay | Admitting: Family Medicine

## 2013-07-23 ENCOUNTER — Telehealth: Payer: Self-pay | Admitting: Family Medicine

## 2013-07-23 ENCOUNTER — Ambulatory Visit: Payer: Medicare HMO | Admitting: Family Medicine

## 2013-07-23 NOTE — Telephone Encounter (Signed)
Patient and [employer] rep called office and scheduled an appt for 5/13

## 2013-07-28 ENCOUNTER — Other Ambulatory Visit: Payer: Self-pay | Admitting: Gastroenterology

## 2013-07-28 ENCOUNTER — Other Ambulatory Visit: Payer: Self-pay | Admitting: Family Medicine

## 2013-08-03 IMAGING — CT CT HEAD W/O CM
1 of 2 series · 15 of 30 positions shown, 19 images · non-contrast
Comparison: Brain CT October 18, 2011; brain MRI October 18, 2011

CLINICAL DATA: Frontal headaches; prior surgery for meningioma

CT HEAD WITHOUT CONTRAST
TECHNIQUE: Contiguous axial images were obtained from the base of
the skull through the vertex without contrast.

[Series 2: headseq 4.8 h37s · axial · 0.43mm/px · z∈[+43,+203]mm · 15 of 36 slices shown, 19 images]
[im 2/36  brain]
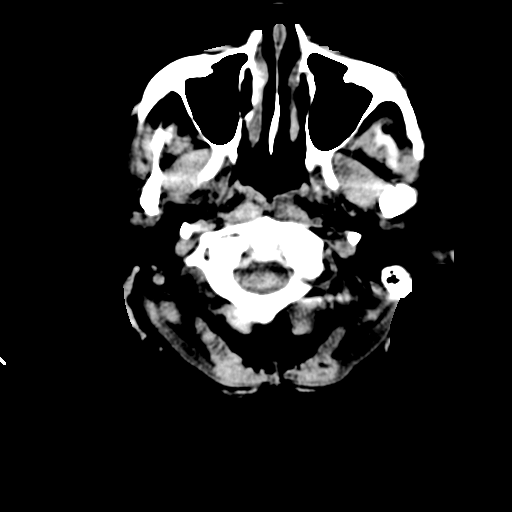
[im 2/36  bone]
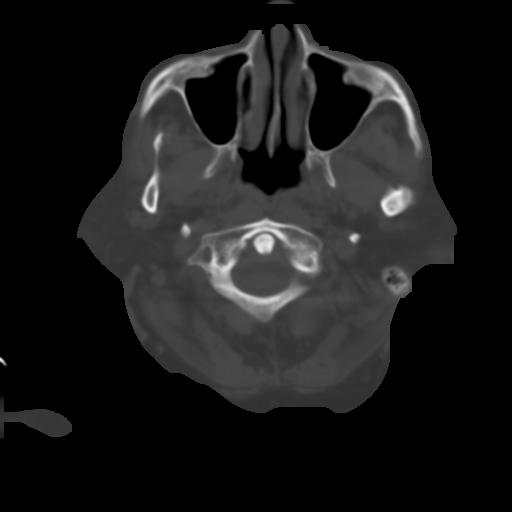
[im 5/36  brain]
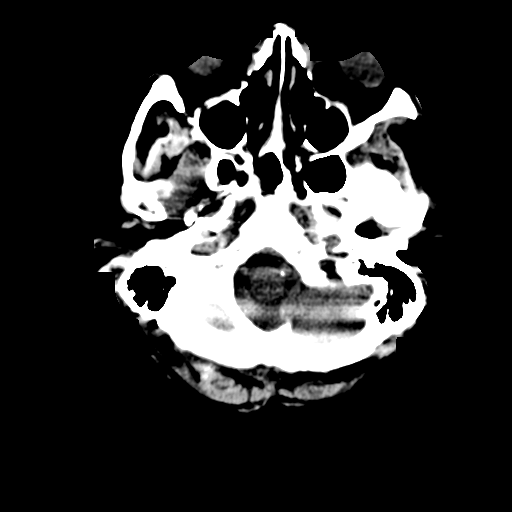
[im 8/36  brain]
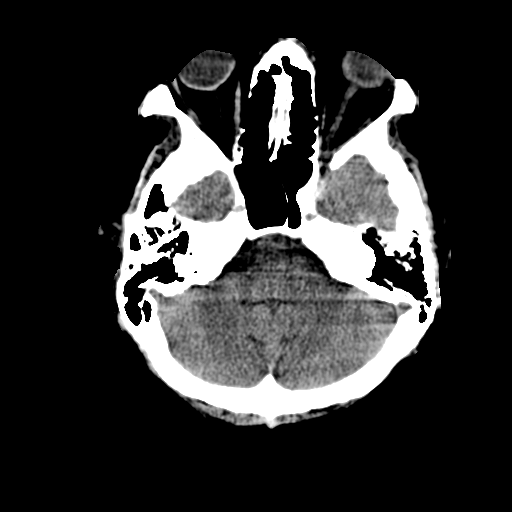
[im 9/36  brain]
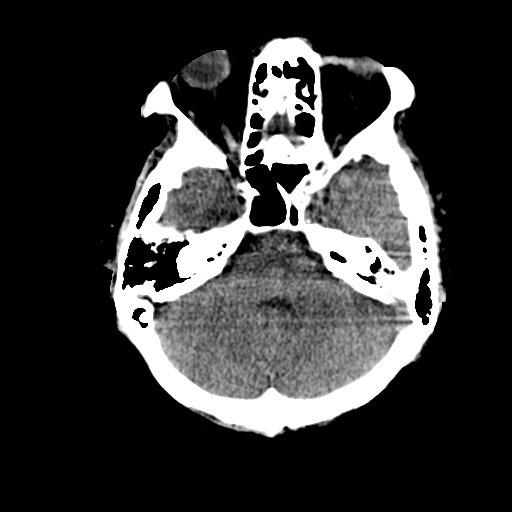
[im 12/36  brain]
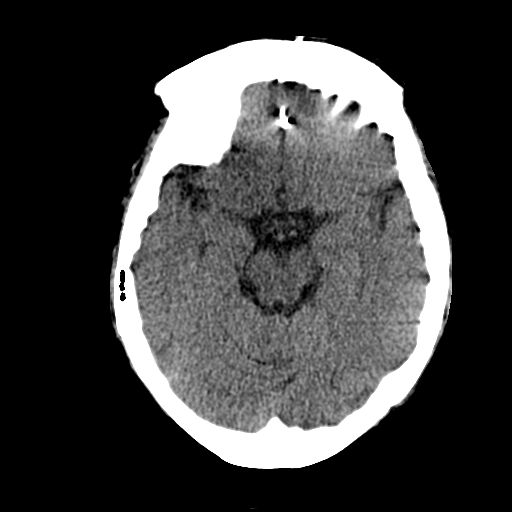
[im 12/36  bone]
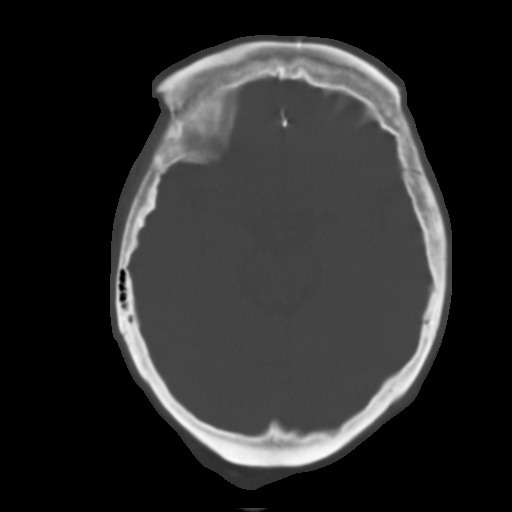
[im 13/36  brain]
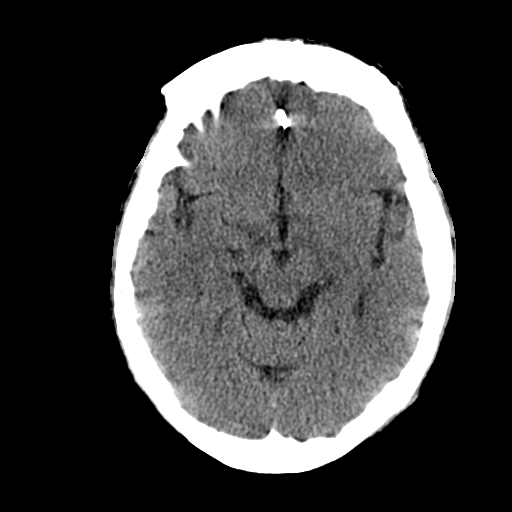
[im 16/36  brain]
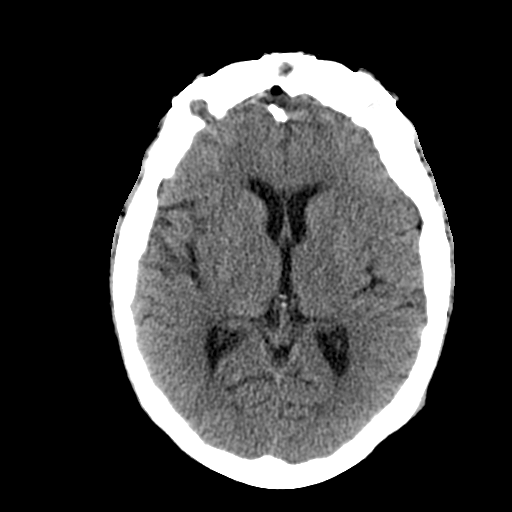
[im 19/36  brain]
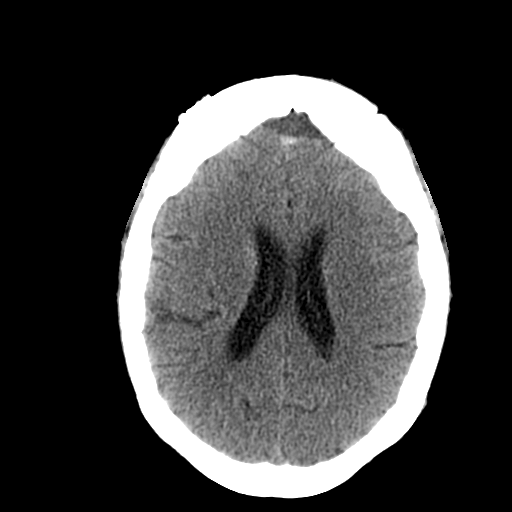
[im 20/36  brain]
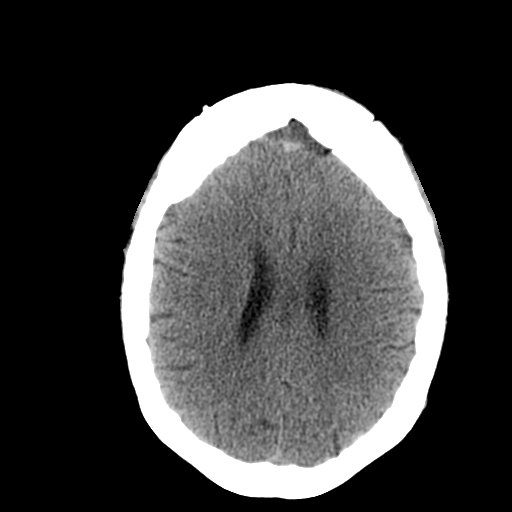
[im 20/36  bone]
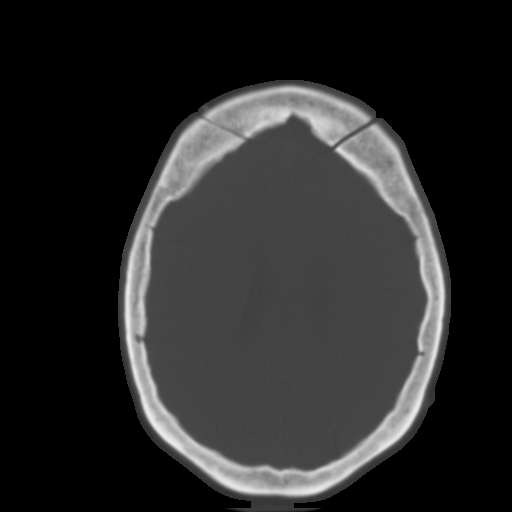
[im 23/36  brain]
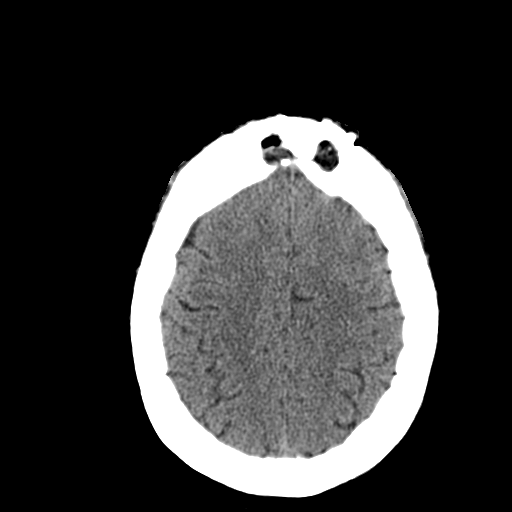
[im 24/36  brain]
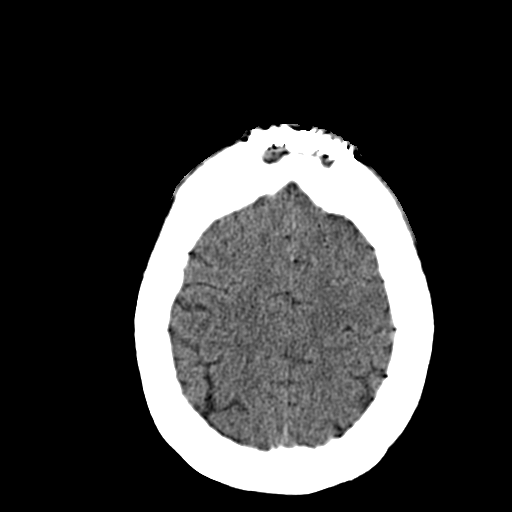
[im 27/36  brain]
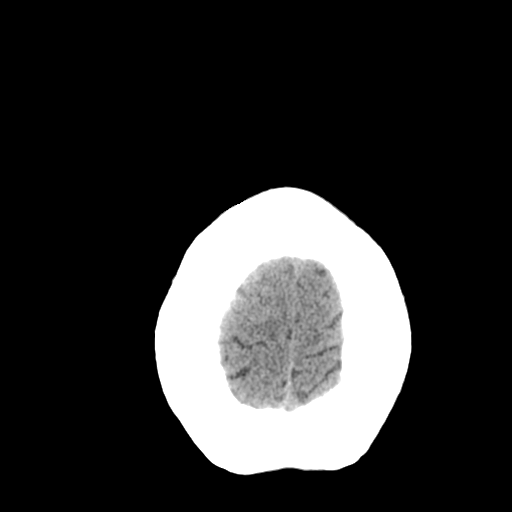
[im 30/36  brain]
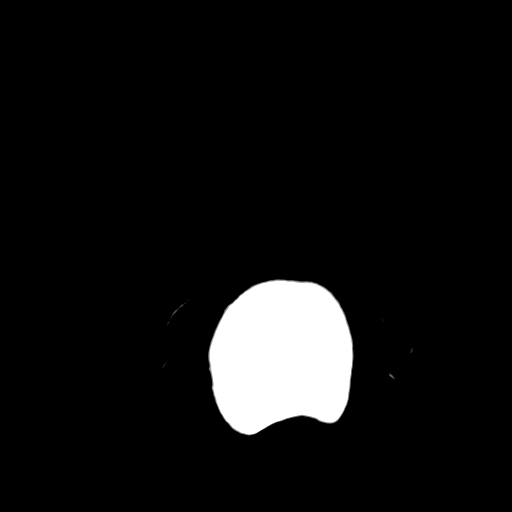
[im 30/36  bone]
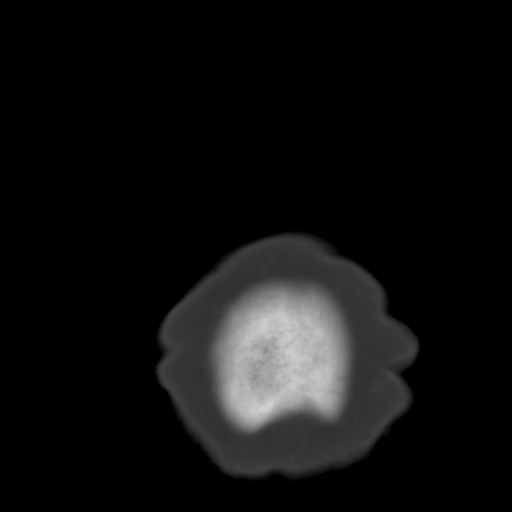
[im 31/36  brain]
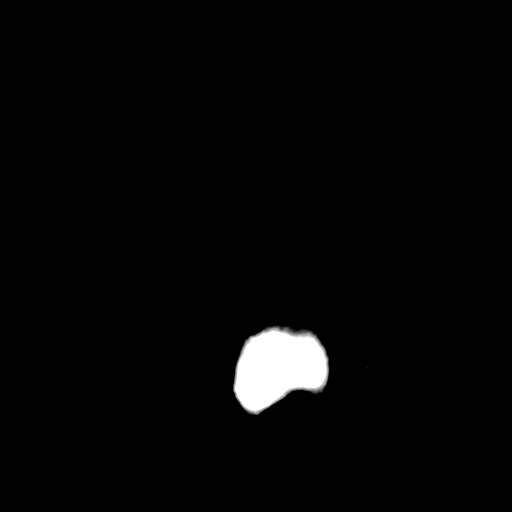
[im 34/36  brain]
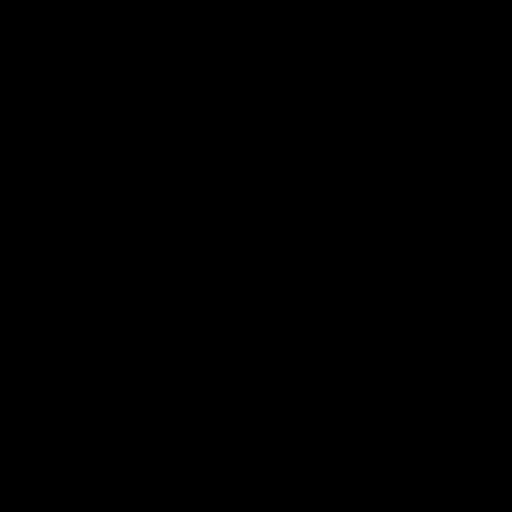

[15 of 30 positions shown; findings below may reference images not displayed]

FINDINGS: The ventricles are normal in size and configuration.
There are surgical clips in the midline frontal region with
interval removal of meningioma from this region.  Currently, there
is no mass or edema.  There is no hemorrhage, extra-axial fluid
collection, or midline shift.  Gray-white compartments are within
normal limits.  Bony calvarium appears intact except for the
bilateral frontal craniotomies.  Mastoids are clear bilaterally.
IMPRESSION: Postoperative removal of anterior frontal meningioma.
There are clips in the midline region in the area of previous
surgery.  On this noncontrast enhanced study, there is no residual
mass. There is no evidence of acute infarct.  Small vessel disease
throughout the pons noted on the prior MR is not well seen on this
noncontrast enhanced CT study. No evidence of hemorrhage.

## 2013-08-06 LAB — COMPLETE METABOLIC PANEL WITH GFR
ALBUMIN: 4 g/dL (ref 3.5–5.2)
ALT: 14 U/L (ref 0–35)
AST: 15 U/L (ref 0–37)
Alkaline Phosphatase: 147 U/L — ABNORMAL HIGH (ref 39–117)
BUN: 18 mg/dL (ref 6–23)
CHLORIDE: 109 meq/L (ref 96–112)
CO2: 24 mEq/L (ref 19–32)
Calcium: 9.2 mg/dL (ref 8.4–10.5)
Creat: 0.88 mg/dL (ref 0.50–1.10)
GFR, EST AFRICAN AMERICAN: 81 mL/min
GFR, EST NON AFRICAN AMERICAN: 70 mL/min
GLUCOSE: 217 mg/dL — AB (ref 70–99)
POTASSIUM: 4.1 meq/L (ref 3.5–5.3)
SODIUM: 141 meq/L (ref 135–145)
Total Bilirubin: 0.3 mg/dL (ref 0.2–1.2)
Total Protein: 6.6 g/dL (ref 6.0–8.3)

## 2013-08-06 LAB — CBC WITH DIFFERENTIAL/PLATELET
Basophils Absolute: 0 10*3/uL (ref 0.0–0.1)
Basophils Relative: 0 % (ref 0–1)
EOS ABS: 0.1 10*3/uL (ref 0.0–0.7)
EOS PCT: 2 % (ref 0–5)
HCT: 33.3 % — ABNORMAL LOW (ref 36.0–46.0)
Hemoglobin: 10.3 g/dL — ABNORMAL LOW (ref 12.0–15.0)
LYMPHS ABS: 2 10*3/uL (ref 0.7–4.0)
LYMPHS PCT: 40 % (ref 12–46)
MCH: 22.4 pg — AB (ref 26.0–34.0)
MCHC: 30.9 g/dL (ref 30.0–36.0)
MCV: 72.4 fL — ABNORMAL LOW (ref 78.0–100.0)
Monocytes Absolute: 0.4 10*3/uL (ref 0.1–1.0)
Monocytes Relative: 8 % (ref 3–12)
Neutro Abs: 2.5 10*3/uL (ref 1.7–7.7)
Neutrophils Relative %: 50 % (ref 43–77)
PLATELETS: 203 10*3/uL (ref 150–400)
RBC: 4.6 MIL/uL (ref 3.87–5.11)
RDW: 14.8 % (ref 11.5–15.5)
WBC: 4.9 10*3/uL (ref 4.0–10.5)

## 2013-08-06 LAB — LIPID PANEL
Cholesterol: 189 mg/dL (ref 0–200)
HDL: 49 mg/dL (ref >39)
LDL CALC: 98 mg/dL (ref 0–99)
Total CHOL/HDL Ratio: 3.9 Ratio
Triglycerides: 209 mg/dL — ABNORMAL HIGH (ref <150)
VLDL: 42 mg/dL — ABNORMAL HIGH (ref 0–40)

## 2013-08-06 LAB — HEMOGLOBIN A1C
Hgb A1c MFr Bld: 8.3 % — ABNORMAL HIGH (ref <5.7)
Mean Plasma Glucose: 192 mg/dL — ABNORMAL HIGH (ref <117)

## 2013-08-11 ENCOUNTER — Telehealth: Payer: Self-pay

## 2013-08-11 NOTE — Telephone Encounter (Signed)
Pt is having diarrhea and does not know when is comes out. She has a lot of gas also. No abd pain. She is wanting to know what she needs to do. She is not taking the Amitiza. Please advise

## 2013-08-12 ENCOUNTER — Ambulatory Visit (INDEPENDENT_AMBULATORY_CARE_PROVIDER_SITE_OTHER): Payer: Commercial Managed Care - HMO | Admitting: Internal Medicine

## 2013-08-12 ENCOUNTER — Encounter: Payer: Self-pay | Admitting: Internal Medicine

## 2013-08-12 VITALS — BP 134/72 | HR 73 | Ht 59.5 in | Wt 166.6 lb

## 2013-08-12 DIAGNOSIS — G4710 Hypersomnia, unspecified: Secondary | ICD-10-CM

## 2013-08-12 DIAGNOSIS — G4733 Obstructive sleep apnea (adult) (pediatric): Secondary | ICD-10-CM

## 2013-08-12 NOTE — Telephone Encounter (Signed)
Find out of she has been on any antibiotics recently. If so, order Cdiff.  Follow a low residue diet.  Hx of IBS.

## 2013-08-12 NOTE — Patient Instructions (Signed)
Order- DME [employer] autotitrate 5-15 cwp x 7 days, for pressure recommendation  Samples Nyuvigil 150 mg, 1 each morning as needed for sleepiness

## 2013-08-12 NOTE — Progress Notes (Signed)
06/18/12- 68 yoF referred courtesy of Dr [name]-sleep study last year per patient. She reports that she was witnessed stopping breathing in sleep, has a history of loud snoring and is a restless sleeper with frequent waking at night, bathroom trips and nightmares. Easy dyspnea on exertion during the day. She does smoke. New cough and sinus pressure this week attributed to "allergy" with green nasal discharge and no headache. She was sent to hospital April 1 when she says she couldn't wake up while at her doctor's office. She sleeps propped up. Gets sleepy in 30 minutes watching TV during the day. Bedtime 10 PM, sleep latency one or 2 hours, waking during the night 3 or 4 times before up at 8 AM. She had a sleep study at [facility] and was given CPAP but says she can't use it because the machine started smoking and she didn't like the pressure. She was changed from [place] to [place]. She is living alone, divorced and disabled. Son has OSA. History of meningioma 11/23/2011. No seizure in over 3 years. Does not drive. NPSG 3/8/13[name]- mild obstructive sleep apnea, AHI 14 per hour, mostly in REM. Epworth sleepiness score 14/24. Weight 164 pounds.  09/26/12- 57 yoF  Former smoker followed for obstructive sleep apnea follows for:  review download from [facility] with pt today.  pt stated that her back has been hurting her.   Did stop smoking! Some wheeze with exertion. She complains that she is falling asleep before she puts her CPAP/ auto/ [employer] on. Download demonstrated poor compliance and control. She had complained of a long history of difficulty falling asleep. Says a neurologist had questioned narcolepsy. She denies cataplexy or sleep paralysis.  11/19/12- 46 yoF  Former smoker followed for obstructive sleep apnea, ? Lung nodule, complicated by hx resection of meningioma FOLLOWS FOR:  Wearing CPAP 8/ [employer] 6-8 hours per night.  Discuss lung nodule was told she had by Dr.  Tula [name] Sleep is comfortable with CPAP. Nuvigil sample did not help with daytime alertness. There was questionable lung nodule but not seen on recent CT chest. Incidental slip in bathroom last week- black eye. CT 08/19/12 IMPRESSION:  The opacity in the inferior segment of the lingula of the left  upper lobe is without significant change. It is morphology is  consistent with scarring and / or atelectasis. This does not have  the configuration of a nodule. Other minor areas of reticular  subsegmental atelectasis are stable as well. There is no discrete  lung nodule. The lungs are otherwise clear.  No additional short-term follow-up is felt indicated. There are no  findings suspicious for neoplastic disease.  Original Report Authenticated By: [name], M.D.  05/21/13- 51 yoF  Former smoker followed for obstructive sleep apnea, R/O'd Lung nodule, complicated by hx resection of meningioma FOLLOWS FOR: has been using old CPAP machine(has been waiting for [employer]  to get her outlets changed over for her new CPAP machine). They have done this and still using old CPAP. Pt states Dr [name] wants her to have another sleep study. NPSG 05/29/11- AHI 14/ hr CPAP 8/ [employer] Always sleepy with bedtime between 8 and 9 PM, at 7:30 AM. At least twice during the night. "addicted to Ativan"-withdrawal if she skips it. CT chest 12/16/12 IMPRESSION:  Minimal chronic atelectasis versus scarring at the bases of left  lower lobe and lingula.  Otherwise negative exam, with no evidence of pulmonary nodule.  No further workup required.  Electronically Signed  By: [name] M.D.  On: 12/16/2012 12:09  08/12/13- 28 yoF Former smoker followed for obstructive sleep apnea, R/O'd Lung nodule, complicated by hx resection of meningioma, seizure, psychosis, CVA-L hemiplegia. Bipolar, GERD, DM Follows for:  Pt here to discuss results of her slpit night sleep study. She is still using her old CPAP machine/ [employer]  8,  and is waking up several times per night.  Describes episodes consistent with cataplexy and laughing or startled, but not sleep paralysis or hallucination. NPSG 06/29/13- Mild OSA, AHI 8.4/ hr, REM only 5.3% of TST, weight 160 lbs MSLT 06/30/13- Pathologically sleepy, mean latency 0.8%, No REM naps. Had taken trazodone, mellaril, Lyrica, mirtazapine the night before, so non-diagnostic.  ROS-see HPI Constitutional:   No-   weight loss, night sweats, fevers, chills, +fatigue, lassitude. HEENT:  + headaches, +difficulty swallowing, tooth/dental problems, sore throat,       +  sneezing, itching, ear ache, +nasal congestion, post nasal drip,  CV:  No-   chest pain, orthopnea, PND, swelling in lower extremities, anasarca, dizziness, palpitations Resp: +  shortness of breath with exertion or at rest.             No- productive cough,  No non-productive cough,  No- coughing up of blood.              No-   change in color of mucus.  No- wheezing.   Skin: No-   rash or lesions. GI:  No-   heartburn, +indigestion, no-abdominal pain, nausea, vomiting,  GU:  MS:  No-   joint pain or swelling.   Neuro-     nothing unusual Psych:  No- change in mood or affect. No depression or anxiety.  No memory loss.  OBJ- Physical Exam - + drowsy during conversation General-  Oriented, Affect-flat, Distress- none acute Skin- rash-none, lesions- none, excoriation- none Lymphadenopathy- none Head- atraumatic            Eyes- Gross vision intact, PERRLA, conjunctivae and secretions clear            Ears- Hearing, canals-normal            Nose- Clear, no-Septal dev, mucus, polyps, erosion, perforation             Throat- Mallampati III , mucosa clear , drainage- none, tonsils- atrophic, + dentures Neck- flexible , trachea midline, no stridor , thyroid nl, carotid no bruit Chest - symmetrical excursion , unlabored           Heart/CV- RRR , no murmur , no gallop  , no rub, nl s1 s2                           - JVD-  none , edema- none, stasis changes- none, varices- none           Lung- clear to P&A, wheeze- none, cough+rattling , dullness-none, rub- none           Chest wall-  Abd- soft Br/ Gen/ Rectal- Not done, not indicated Extrem- cyanosis- none, clubbing, none, atrophy- none, strength- nl. +cane Neuro- + somewhat slow but oriented, responsive, rocking and shifting.

## 2013-08-13 ENCOUNTER — Encounter: Payer: Self-pay | Admitting: *Deleted

## 2013-08-13 NOTE — Telephone Encounter (Signed)
Tried to call with no answer  

## 2013-08-13 NOTE — Telephone Encounter (Signed)
Pt has not been on any antibiotics. She is better since she has not taken the Amitiza.

## 2013-08-13 NOTE — Telephone Encounter (Signed)
Good to hear

## 2013-08-14 ENCOUNTER — Other Ambulatory Visit (HOSPITAL_COMMUNITY): Payer: Medicare HMO

## 2013-08-14 ENCOUNTER — Encounter: Payer: Self-pay | Admitting: Family Medicine

## 2013-08-14 ENCOUNTER — Telehealth: Payer: Self-pay

## 2013-08-14 ENCOUNTER — Ambulatory Visit (HOSPITAL_COMMUNITY)
Admission: RE | Admit: 2013-08-14 | Discharge: 2013-08-14 | Disposition: A | Discharge status: Home / Self Care | Payer: Medicare HMO | Source: Ambulatory Visit | Attending: Family Medicine | Admitting: Family Medicine

## 2013-08-14 ENCOUNTER — Ambulatory Visit (INDEPENDENT_AMBULATORY_CARE_PROVIDER_SITE_OTHER): Payer: Commercial Managed Care - HMO | Admitting: Family Medicine

## 2013-08-14 ENCOUNTER — Ambulatory Visit (HOSPITAL_COMMUNITY): Admission: RE | Admit: 2013-08-14 | Payer: Medicare HMO | Source: Ambulatory Visit

## 2013-08-14 ENCOUNTER — Encounter: Payer: Self-pay | Admitting: Internal Medicine

## 2013-08-14 VITALS — BP 158/82 | HR 96 | Resp 18 | Wt 166.1 lb

## 2013-08-14 DIAGNOSIS — W19XXXA Unspecified fall, initial encounter: Secondary | ICD-10-CM

## 2013-08-14 DIAGNOSIS — E1149 Type 2 diabetes mellitus with other diabetic neurological complication: Secondary | ICD-10-CM

## 2013-08-14 DIAGNOSIS — IMO0002 Reserved for concepts with insufficient information to code with codable children: Secondary | ICD-10-CM

## 2013-08-14 DIAGNOSIS — S0990XA Unspecified injury of head, initial encounter: Secondary | ICD-10-CM

## 2013-08-14 DIAGNOSIS — Y929 Unspecified place or not applicable: Secondary | ICD-10-CM | POA: Insufficient documentation

## 2013-08-14 DIAGNOSIS — Z9181 History of falling: Secondary | ICD-10-CM

## 2013-08-14 DIAGNOSIS — Y92009 Unspecified place in unspecified non-institutional (private) residence as the place of occurrence of the external cause: Principal | ICD-10-CM

## 2013-08-14 DIAGNOSIS — E1142 Type 2 diabetes mellitus with diabetic polyneuropathy: Secondary | ICD-10-CM

## 2013-08-14 DIAGNOSIS — E1065 Type 1 diabetes mellitus with hyperglycemia: Secondary | ICD-10-CM

## 2013-08-14 DIAGNOSIS — E1140 Type 2 diabetes mellitus with diabetic neuropathy, unspecified: Secondary | ICD-10-CM

## 2013-08-14 DIAGNOSIS — I10 Essential (primary) hypertension: Secondary | ICD-10-CM

## 2013-08-14 DIAGNOSIS — Z794 Long term (current) use of insulin: Secondary | ICD-10-CM

## 2013-08-14 DIAGNOSIS — E039 Hypothyroidism, unspecified: Secondary | ICD-10-CM

## 2013-08-14 DIAGNOSIS — R296 Repeated falls: Secondary | ICD-10-CM

## 2013-08-14 DIAGNOSIS — E1165 Type 2 diabetes mellitus with hyperglycemia: Secondary | ICD-10-CM

## 2013-08-14 DIAGNOSIS — IMO0001 Reserved for inherently not codable concepts without codable children: Secondary | ICD-10-CM

## 2013-08-14 DIAGNOSIS — Z0000 Encounter for general adult medical examination without abnormal findings: Secondary | ICD-10-CM

## 2013-08-14 MED ORDER — HYDROCHLOROTHIAZIDE 25 MG PO TABS: 25.0000 mg | ORAL_TABLET | Freq: Every day | ORAL | Status: DC

## 2013-08-14 NOTE — Telephone Encounter (Signed)
Spoke with patient and she is refusing to go to the ED due to the cost of services.  She will make an attempt to get to hospital for CT scan before appointment in this office this evening.

## 2013-08-14 NOTE — Addendum Note (Signed)
Addended by: Denman George B on: 08/14/2013 01:32 PM   Modules accepted: Orders

## 2013-08-14 NOTE — Addendum Note (Signed)
Addended by: Denman George B on: 08/14/2013 01:58 PM   Modules accepted: Orders

## 2013-08-14 NOTE — Telephone Encounter (Signed)
Noted, thanks for discussing need to get head scan done as well as ordering test

## 2013-08-14 NOTE — Patient Instructions (Signed)
F/u 2nd week in July  Also ear flush at that visit  New med for BP , HCTZ 25 mg daily (OK to take two 12.5mg  tabs daily till done)  Foot exam is good today

## 2013-08-14 NOTE — Progress Notes (Signed)
Subjective:    Patient ID: [name], female    DOB: 30-Mar-1950, 63 y.o.   MRN: [id number]  HPI  Preventive Screening-Counseling & Management   Patient present here today for a Medicare annual wellness visit. C/o head trauma earlier on day of visit due to fall, developed "egg sized swelling" denies LOC, urinary or fecal incontinence or seizure type activity as cause of her fall. Ha sh/o multiple falls and unsteady gait   Current Problems (verified)   Medications Prior to Visit Allergies (verified)   PAST HISTORY  Family History  Social History Lives alone, disabled Mom ofon son, nicoitine use as recently as 6 months ago, no alcohol or street drug use   Risk Factors  Current exercise habits:  None significant, marked limitation in mobility with safety issues, chair exercise and stretches encouraged  Dietary issues discussed:heart healthy, low carb   Cardiac risk factors: h/o CVa, hTN, nicotine and IDDM uncontrolled  Depression Screen  (Note: if answer to either of the following is "Yes", a more complete depression escreening is indicated)  [facility], established illness for over 30 years, genrally controlled Over the past two weeks, have you felt down, depressed or hopeless? No  Over the past two weeks, have you felt little interest or pleasure in doing things? No  Have you lost interest or pleasure in daily life? No  Do you often feel hopeless? No  Do you cry easily over simple problems? No   Activities of Daily Living  In your present state of health, do you have any difficulty performing the following activities?  Driving?: N/A , never drove Managing money?:niece has POA Feeding yourself?:yes aide fixes Getting from bed to chair?:yes due to [employer] a flight of stairs?:yes Preparing food and eating?:yes with food prep , able to eat independently Bathing or showering?: yes, needs help Getting dressed?:yes Getting to the  toilet?:yes at times Using the toilet?:No Moving around from place to place?: yes unsteady gait and multiple falls  Fall Risk Assessment In the past year have you fallen or had a near fall?:yes multiple times, unsteady often, as recently as day of visit Are you currently taking any medications that make you dizzy?:No   Hearing Difficulties: No Do you often ask people to speak up or repeat themselves?:No, but infrequently has to do this Do you experience ringing or noises in your ears?:No Do you have difficulty understanding soft or whispered voices?:yes at times, has bilateral cerumen  Cognitive Testing  Alert? Yes Normal Appearance?Yes , however on several occasions at visits, pt excessively drowsy, and this is noted by multiple Providers Oriented to person? Yes Place? Yes  Time? Yes  Displays appropriate judgment?at times Can read the correct time from a watch face? yes Are you having problems remembering things?yes  Advanced Directives have been discussed with the patient?Yes , full code, pt to further address with family members, she has one son and a niece who is responsible for her   List the Names of Other Physician/Practitioners you currently use: as listed    Indicate any recent Medical Services you may have received from other than [facility] providers in the past year (date may be approximate).   Assessment:    Annual Wellness Exam   Plan:    During the course of the visit the patient was educated and counseled about appropriate screening and preventive services including:  A healthy diet is rich in fruit, vegetables and whole grains. Poultry fish,  nuts and beans are a healthy choice for protein rather then red meat. A low sodium diet and drinking 64 ounces of water daily is generally recommended. Oils and sweet should be limited. Carbohydrates especially for those who are diabetic or overweight, should be limited to 30-45 gram per meal. It is important to eat on a regular  schedule, at least 3 times daily. Snacks should be primarily fruits, vegetables or nuts. It is important that you exercise regularly at least 30 minutes 5 times a week. If you develop chest pain, have severe difficulty breathing, or feel very tired, stop exercising immediately and seek medical attention  Immunization reviewed and updated. Cancer screening reviewed and updated    Patient Instructions (the written plan) was given to the patient.  Medicare Attestation  I have personally reviewed:  The patient's medical and social history  Their use of alcohol, tobacco or illicit drugs  Their current medications and supplements  The patient's functional ability including ADLs,fall risks, home safety risks, cognitive, and hearing and visual impairment  Diet and physical activities  Evidence for depression or mood disorders  The patient's weight, height, BMI, and visual acuity have been recorded in the chart. I have made referrals, counseling, and provided education to the patient based on review of the above and I have provided the patient with a written personalized care plan for preventive services.     Review of Systems     Objective:   Physical Exam  Pt alert and oriented in no C/p distress BP 158/82  Pulse 96  Resp 18  Wt 166 lb 1.9 oz (75.352 kg)  SpO2 98%  Neuro: CN 2 to 12 intact, no facial asymetry, power , tone and sensation normal throughout. Head CT scan negative for subdural bleed Pt alert and oriented x 3  CVS:BP 158/82 Heart sounds normal S1 and S2 no S3 no murmur, no JVD, no pedal edema  IDDM: foot exam due and done , within normal      Assessment & Plan:  HYPERTENSION Uncontrolled, dose increase in med DASH diet and commitment to daily physical activity for a minimum of 30 minutes discussed and encouraged, as a part of hypertension management. The importance of attaining a healthy weight is also discussed. Sodium restriction also stressed  Encounter for  Medicare annual wellness exam Annual wellness as documented. Pt requires assistance with ADL's and has care available for 7 days per week She is a high fall risk and has had repeated falls, most recently on the day of visit , called in earlier stating she had fallen and had swelling on her head, the "size of an egg" CT scan done earlier negative for intracranial bleed Pt has collected info to review re her end of life wishes , she is a full code  Recent head trauma No skin breakdown noted , no neurologic deficit, head CT negative for acute bleed  Falls frequently Recurrent falls due to a combination of factors, poly[pharmacy with excessive sleepiness often noted, arthritic problems with neurologic complications also resulting in weakness and numbness with gait instability Safety issues addressed briefly once more, esp in light of fall on the day of visit

## 2013-08-15 ENCOUNTER — Telehealth: Payer: Self-pay | Admitting: *Deleted

## 2013-08-15 NOTE — Telephone Encounter (Signed)
Called latoya and LMTCB x1

## 2013-08-16 ENCOUNTER — Encounter: Payer: Self-pay | Admitting: Family Medicine

## 2013-08-16 DIAGNOSIS — Z0000 Encounter for general adult medical examination without abnormal findings: Secondary | ICD-10-CM | POA: Insufficient documentation

## 2013-08-16 DIAGNOSIS — E039 Hypothyroidism, unspecified: Secondary | ICD-10-CM | POA: Insufficient documentation

## 2013-08-16 DIAGNOSIS — S0990XA Unspecified injury of head, initial encounter: Secondary | ICD-10-CM | POA: Insufficient documentation

## 2013-08-16 LAB — MICROALBUMIN / CREATININE URINE RATIO
Creatinine, Urine: 86.2 mg/dL
MICROALB UR: 0.81 mg/dL (ref 0.00–1.89)
Microalb Creat Ratio: 9.4 mg/g (ref 0.0–30.0)

## 2013-08-16 NOTE — Assessment & Plan Note (Signed)
Recurrent falls due to a combination of factors, poly[pharmacy with excessive sleepiness often noted, arthritic problems with neurologic complications also resulting in weakness and numbness with gait instability Safety issues addressed briefly once more, esp in light of fall on the day of visit

## 2013-08-16 NOTE — Assessment & Plan Note (Addendum)
Uncontrolled, dose increase in med DASH diet and commitment to daily physical activity for a minimum of 30 minutes discussed and encouraged, as a part of hypertension management. The importance of attaining a healthy weight is also discussed. Sodium restriction also stressed

## 2013-08-16 NOTE — Assessment & Plan Note (Signed)
No skin breakdown noted , no neurologic deficit, head CT negative for acute bleed

## 2013-08-16 NOTE — Assessment & Plan Note (Signed)
Annual wellness as documented. Pt requires assistance with ADL's and has care available for 7 days per week She is a high fall risk and has had repeated falls, most recently on the day of visit , called in earlier stating she had fallen and had swelling on her head, the "size of an egg" CT scan done earlier negative for intracranial bleed Pt has collected info to review re her end of life wishes , she is a full code

## 2013-08-18 ENCOUNTER — Encounter: Payer: Self-pay | Admitting: Internal Medicine

## 2013-08-18 ENCOUNTER — Ambulatory Visit (INDEPENDENT_AMBULATORY_CARE_PROVIDER_SITE_OTHER): Payer: Commercial Managed Care - HMO | Admitting: Internal Medicine

## 2013-08-18 VITALS — BP 102/64 | HR 70 | Ht 59.0 in | Wt 169.2 lb

## 2013-08-18 DIAGNOSIS — I2510 Atherosclerotic heart disease of native coronary artery without angina pectoris: Secondary | ICD-10-CM

## 2013-08-18 DIAGNOSIS — F319 Bipolar disorder, unspecified: Secondary | ICD-10-CM

## 2013-08-18 DIAGNOSIS — I10 Essential (primary) hypertension: Secondary | ICD-10-CM

## 2013-08-18 DIAGNOSIS — E785 Hyperlipidemia, unspecified: Secondary | ICD-10-CM

## 2013-08-18 DIAGNOSIS — D320 Benign neoplasm of cerebral meninges: Secondary | ICD-10-CM

## 2013-08-18 DIAGNOSIS — D329 Benign neoplasm of meninges, unspecified: Secondary | ICD-10-CM

## 2013-08-18 NOTE — Progress Notes (Signed)
OFFICE NOTE  Chief Complaint:  Routine follow-up  Primary Care Physician: Tula Nakayama, MD  HPI:  [name] is a 63 year-old female with history of hypertension, diabetes, dyslipidemia, obstructive sleep apnea on CPAP as well as struggling with depression, Bipolar Disorder, insomnia and chronic low back pain. Recently she underwent brain surgery after I had cleared her and felt that she was an intermediate risk for perioperative events. She did do well with the surgery and reports her pain as improved as well as some of her lower extremity swelling. Her main complaint today is depression and insomnia, and I understand that she has seen you about this and as far as management has been deferred to her psychiatrist. She is complaining that she cannot get to see her psychiatrist as he is on extended medical leave. From a cardiac standpoint, her blood pressure is actually very well controlled on her current regimen including clonidine, today was 140/80, heart rate in the 80s, and she denies any chest pain, worsening shortness of breath, palpitations, presyncope, or syncopal symptoms. Her main complaints have to do with memory loss, confusion and speech difficulty as well as left arm pain for which she is seeing Dr. Aline Brochure.  [name] returns today for cardiac followup. She denies any worsening shortness of breath or chest pain. She struggling with both sleep apnea and narcolepsy. She has fallen asleep during office visits and was very somnolent in the office today. Blood pressure was low normal and heart rate to well controlled.  PMHx:  Past Medical History  Diagnosis Date  . Bipolar disorder   . CVA (cerebral infarction)   . Pancreatitis 2006    due to Depakote with normal EUS   . Osteoporosis   . Chronic back pain   . Diabetes mellitus   . Trigger finger   . Anxiety disorder   . Hypertension   . Migraines     chronic headaches  . Diverticulosis     TCS 9/08 by Dr.  Delfin Edis for diarrhea . Bx for micro scopic colitis negative.   . Schatzki's ring     non critical / EGD with ED 8/2011with RMR  . S/P colonoscopy 9735,3299, 2011    left-sided diverticula, hx of simple adenomas . 2011, random bx negative for microscopic colitis  . Glaucoma   . Allergic rhinitis   . Hypothyroidism     thyroid goiter  . Anemia   . Blood transfusion   . GERD (gastroesophageal reflux disease)   . Stroke     left sided weakness  . Seizures     unknown etiology-on meds-last seizure was 3 yerars ago  . Anxiety   . Depression   . Metabolic encephalopathy 2/42/6834  . Sleep apnea     on CPAP  . Arthritis   . Gum symptoms     infection on antibiotic  . Chronic neck pain   . Mononeuritis lower limb   . Frequent falls     Past Surgical History  Procedure Laterality Date  . Abdominal hysterectomy  1978  . Cholecystectomy  1984  . Ovarian cyst removal    . Carpal tunnel release Left 07/22/04    Dr. Aline Brochure  . Breast reduction surgery  1994  . Cataract extraction Bilateral   . Biopsy thyroid  2009  . Surgical excision of 3 tumors from right thigh and right buttock  and left upper thigh  2010  . Back surgery  July 2012  . Spine surgery  09/29/2010    Dr. Rolena Infante  . Maloney dilation  12/29/2010    RMR;  . Esophagogastroduodenoscopy  12/29/2010    Rourk-Retained food in the esophagus and stomach, small hiatal hernia, status post Maloney dilation of the esophagus  . Craniotomy  11/23/2011    Procedure: CRANIOTOMY TUMOR EXCISION;  Surgeon: Hosie Spangle, MD;  Location: [place] [facility];  Service: Neurosurgery;  Laterality: N/A;  Craniotomy for tumor resection  . Brain surgery  11/2011    resection of meningioma  . Colonoscopy N/A 09/25/2012    [facility]:WEXHBZJ diverticulosis.  colonic polyp-removed : tubular adenoma  . Esophagogastroduodenoscopy N/A 09/25/2012    IR[facility]:VELFYBOF atonic baggy esophagus status post Maloney dilation 36 F. Hiatal hernia  . Givens capsule study  N/A 01/15/2013    NORMAL.   . Bacterial overgrowth test N/A 05/05/2013    Procedure: BACTERIAL OVERGROWTH TEST;  Surgeon: Daneil Dolin, MD;  Location: [facility];  Service: Endoscopy;  Laterality: N/A;  7:30  . Transthoracic echocardiogram  2010    EF 60-65%, mild conc LVH, grade 1 diastolic dysfunction; mildly calcified MV annulus with mildly thickened leaflets, mildly calcified MR annulus  . Nm myocar perf wall motion  2006    "relavtiely normal" persantine, mild anterior thinning (breast attenuation artifact), no region of scar/ischemia  . Cardiac catheterization  05/10/2005    normal coronaries, normal LV systolic function and EF (Dr. Jackie Plum)    FAMHx:  Family History  Problem Relation Age of Onset  . Heart attack Mother     HTN  . Pneumonia Father   . Kidney failure Father   . Pancreatic cancer Sister   . Diabetes Brother   . Hypertension Brother   . Colon cancer Neg Hx   . Anesthesia problems Neg Hx   . Hypotension Neg Hx   . Malignant hyperthermia Neg Hx   . Pseudochol deficiency Neg Hx   . Stroke Maternal Grandmother   . Heart attack Maternal Grandfather   . Cancer Sister   . Hypertension Son     SOCHx:   reports that she quit smoking about 13 months ago. Her smoking use included Cigarettes. She has a 1.75 pack-year smoking history. She has never used smokeless tobacco. She reports that she does not drink alcohol or use illicit drugs.  ALLERGIES:  Allergies  Allergen Reactions  . Cephalexin Hives  . Iron Nausea And Vomiting  . Milk-Related Compounds Other (See Comments)    Doesn't agree with stomach.   . Penicillins Hives  . Phenazopyridine Hcl Other (See Comments)    [place]      ROS: A comprehensive review of systems was negative except for: Constitutional: positive for fatigue and somnolence Neurological: positive for memory problems, speech problems and narcolepsy  HOME MEDS: Current Outpatient Prescriptions  Medication Sig Dispense Refill  .  aspirin EC 81 MG tablet Take 81 mg by mouth daily.      . B Complex-Biotin-FA (B-COMPLEX PO) Take 1 tablet by mouth daily.      . clobetasol (TEMOVATE) 0.05 % external solution Apply 1 application topically 2 (two) times daily.       . cloNIDine (CATAPRES) 0.3 MG tablet Take 0.3 mg by mouth every 8 (eight) hours. 8am;4pm;67midnight      . CRESTOR 5 MG tablet TAKE 1 TABLET BY MOUTH AT BEDTIME.  30 tablet  2  . diclofenac sodium (VOLTAREN) 1 % GEL Apply 2 g topically daily as needed (Pain).      [name]  dicyclomine (BENTYL) 10 MG capsule Take 1 capsule (10 mg total) by mouth 4 (four) times daily -  before meals and at bedtime.  120 capsule  3  . diphenoxylate-atropine (LOMOTIL) 2.5-0.025 MG per tablet Take 1 tablet by mouth 2 (two) times daily as needed for diarrhea or loose stools.  30 tablet  0  . hydrochlorothiazide (HYDRODIURIL) 25 MG tablet Take 1 tablet (25 mg total) by mouth daily.  30 tablet  3  . hydroxychloroquine (PLAQUENIL) 200 MG tablet Take 200 mg by mouth daily.      . insulin glargine (LANTUS) 100 UNIT/ML injection Inject 50 Units into the skin daily.       [name] lamoTRIgine (LAMICTAL) 100 MG tablet Take 150 mg by mouth every morning.       [name] levothyroxine (LEVOXYL) 50 MCG tablet Take 50 mcg by mouth every morning.       [name] LORazepam (ATIVAN) 0.5 MG tablet Take 0.5 mg by mouth 2 (two) times daily.       [name] losartan (COZAAR) 100 MG tablet Take 1 tablet (100 mg total) by mouth daily.  30 tablet  11  . metoprolol (LOPRESSOR) 50 MG tablet Take 1 tablet (50 mg total) by mouth 2 (two) times daily.  60 tablet  6  . mirtazapine (REMERON) 30 MG tablet Take 30 mg by mouth at bedtime.       . montelukast (SINGULAIR) 10 MG tablet TAKE ONE TABLET DAILY.  30 tablet  2  . Multiple Vitamins-Minerals (ONE-A-DAY 50 PLUS PO) Take 1 tablet by mouth daily.      . ondansetron (ZOFRAN) 4 MG tablet Take 1 tablet (4 mg total) by mouth 3 (three) times daily with meals.  90 tablet  3  . ONGLYZA 5 MG TABS tablet Take 5 mg  by mouth daily.      [name] oxyCODONE-acetaminophen (PERCOCET) 10-325 MG per tablet Take 1 tablet by mouth 3 (three) times daily as needed for pain.       . pregabalin (LYRICA) 75 MG capsule Take 75 mg by mouth 2 (two) times daily.       . Probiotic Product (PROBIOTIC FORMULA PO) Take 1 capsule by mouth daily.      . promethazine (PHENERGAN) 12.5 MG tablet Take 1 tablet (12.5 mg total) by mouth every 6 (six) hours as needed. FOR NAUSEA AND VOMITING  30 tablet  0  . RABEprazole (ACIPHEX) 20 MG tablet TAKE 1 TABLET BY MOUTH TWICE DAILY.  60 tablet  5  . sertraline (ZOLOFT) 100 MG tablet Take 100 mg by mouth at bedtime.       . terbinafine (LAMISIL) 250 MG tablet Take 1 tablet by mouth daily.      [name] thioridazine (MELLARIL) 10 MG tablet Take 20 mg by mouth at bedtime.       . traZODone (DESYREL) 50 MG tablet Take 50 mg by mouth at bedtime.       . Vitamin D, Ergocalciferol, (DRISDOL) 50000 UNITS CAPS capsule Take 50,000 Units by mouth every 7 (seven) days. Sunday       No current facility-administered medications for this visit.    LABS/IMAGING: No results found for this or any previous visit (from the past 48 hour(s)). No results found.  VITALS: BP 102/64  Pulse 70  Ht 4\' 11"  (1.499 m)  Wt 169 lb 3.2 oz (76.749 kg)  BMI 34.16 kg/m2  EXAM: General appearance: no distress and somnolent Neck: no adenopathy, no carotid bruit, no JVD, supple,  symmetrical, trachea midline and thyroid not enlarged, symmetric, no tenderness/mass/nodules Lungs: clear to auscultation bilaterally Heart: regular rate and rhythm, S1, S2 normal, no murmur, click, rub or gallop Abdomen: soft, non-tender; bowel sounds normal; no masses,  no organomegaly Extremities: extremities normal, atraumatic, no cyanosis or edema Pulses: 2+ and symmetric Skin: Skin color, texture, turgor normal. No rashes or lesions Neurologic: Mental status: Alert and oriented, speech is slow and somewhat rambling  EKG: Normal sinus rhythm at  70  ASSESSMENT: 1. Hypertension-controlled 2. Hyperlipidemia 3. Diabetes type 2 4. Obstructive sleep apnea on CPAP 5. Depression/insomnia/bipolar disorder 6. Recent meningioma status post resection 7. Narcolepsy  PLAN: 1.   Ms. Valladares has well controlled blood pressure. She is on medication for diabetes and dyslipidemia. She does have some known atherosclerosis. She's not having chest pain symptoms. She does get short of breath but it is stable at her baseline. Echocardiogram in 2010 and showed normal systolic function. Her main issues have to do with sleep apnea and narcolepsy.  There do not appear to be any active cardiac issues at this time. Plan to see her back annually or sooner as necessary.   Pixie Casino, MD, [facility] Attending Cardiologist [facility] 08/18/2013, 4:03 PM

## 2013-08-18 NOTE — Telephone Encounter (Signed)
[place], [name] is stating they did not receive the order for this pt. They state they received the sleep study and ov note. Please re-send order, Thanks! Bing, CMA

## 2013-08-18 NOTE — Patient Instructions (Signed)
Your physician wants you to follow-up in: 1 year. You will receive a reminder letter in the mail two months in advance. If you don't receive a letter, please call our office to schedule the follow-up appointment.  

## 2013-08-18 NOTE — Telephone Encounter (Signed)
[facility]ed order to [facility] ° °

## 2013-08-19 ENCOUNTER — Telehealth: Payer: Self-pay | Admitting: Internal Medicine

## 2013-08-19 NOTE — Telephone Encounter (Signed)
Called spoke with pt. She reports [facility] called and told her they are still awaiting CPAP rx from [name]. According to [facility] she faxed this to them yesterday.  I called [facility] and spoke with [facility]. She does see order in system and will get in touch with billing to get this processed. They will call pt within next 3 days Called made pt aware.  Nothing further needed

## 2013-08-21 ENCOUNTER — Telehealth: Payer: Self-pay | Admitting: *Deleted

## 2013-08-21 DIAGNOSIS — G4733 Obstructive sleep apnea (adult) (pediatric): Secondary | ICD-10-CM

## 2013-08-21 NOTE — Telephone Encounter (Signed)
I spoke with [place] with [facility] and she states she called a few days ago about getting an rx for pt cpap. According to pt chart order was faxed. Phineas Real was stating what they received was a referral and it was unacceptable as an order. I asked her why because this is what we always send to DME as an order. After discussing this for several minutes with Latoya the order does have all the information they need except NPI # and we need to change [facility] to [place]. I have placed a new order and sent it to [facility] to re-send. Nothing further needed at this time. [place] Bing, CMA

## 2013-08-25 ENCOUNTER — Other Ambulatory Visit: Payer: Self-pay | Admitting: Family Medicine

## 2013-08-25 ENCOUNTER — Telehealth: Payer: Self-pay | Admitting: Family Medicine

## 2013-08-25 DIAGNOSIS — E1165 Type 2 diabetes mellitus with hyperglycemia: Secondary | ICD-10-CM

## 2013-08-25 DIAGNOSIS — M549 Dorsalgia, unspecified: Secondary | ICD-10-CM

## 2013-08-25 DIAGNOSIS — IMO0001 Reserved for inherently not codable concepts without codable children: Secondary | ICD-10-CM

## 2013-08-25 DIAGNOSIS — N189 Chronic kidney disease, unspecified: Secondary | ICD-10-CM

## 2013-08-26 ENCOUNTER — Telehealth: Payer: Self-pay | Admitting: Internal Medicine

## 2013-08-26 NOTE — Telephone Encounter (Signed)
[name] states BP was low when seen Dr Dorris Fetch. Stopped 2 of her BP meds for now. Will check with patient regarding BP now

## 2013-08-26 NOTE — Telephone Encounter (Addendum)
Pt states that she was informed by [name] that they received our prescription for pt CPAP and changes to be made Needing more information from our office before they can give her the machine  Called and spoke with Lynn Ito at [facility] as though they have everything they need States that she sees a note in the chart that they need a prescription signed with [facility] NPI# attached--- all this information was faxed with order. Per [place] she will call back after doing some research

## 2013-08-28 NOTE — Telephone Encounter (Signed)
Per helena they have everything they needed. Will sign off message

## 2013-08-28 NOTE — Telephone Encounter (Signed)
Arbie Cookey from [place] returned call. Please call back at [phone]

## 2013-08-28 NOTE — Telephone Encounter (Signed)
Will await a call from [place]- I attempted to call [facility] to reach her this am and was placed on hold for 10 minutes. Will forward to Triage to help assist in calling to make sure [name] does not need any thing else and we can sign off on message.

## 2013-09-06 ENCOUNTER — Telehealth: Payer: Self-pay | Admitting: Family Medicine

## 2013-09-06 NOTE — Telephone Encounter (Signed)
C/o excess cramps and spasm affecting hands and fingers, kept her up all night, lab review, needs magnesium level checked. I advise trial of mustard for the cramp, may also need to re address this with her neurologist Also c/o uncontrolled scalp discomfort, needs to  spk with dermatology States her blood sugar is still elevated , advised her to directly call the endo office  About this , state   Nurse pls order magnesium level to be drawn dx muscle cramps and low magnesium

## 2013-09-08 ENCOUNTER — Other Ambulatory Visit: Payer: Self-pay | Admitting: Family Medicine

## 2013-09-09 IMAGING — CT CT HEAD W/O CM
2 of 3 series · 16 of 30 positions shown, 20 images · non-contrast
Comparison: Prior head CT 01/09/2012

CLINICAL DATA: The patient found on floor unconscious, history of
frontal meningioma resection in November 2011

CT HEAD WITHOUT CONTRAST
TECHNIQUE: Contiguous axial images were obtained from the base of
the skull through the vertex without contrast.

[Series 3: headtrauma 4.8 h37s · axial · 0.47mm/px · z∈[+82,+111]mm · 2 of 24 slices shown]
[im 6/24  brain]
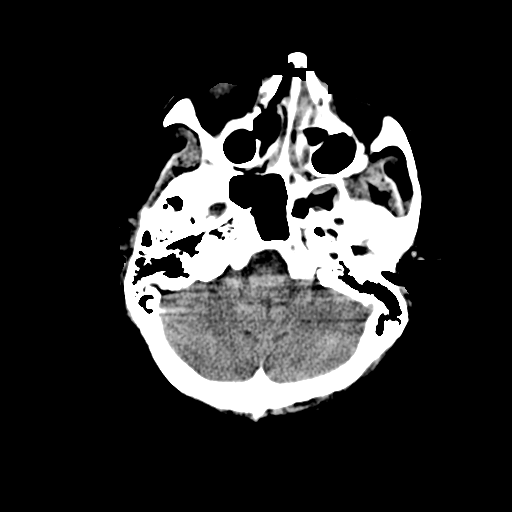
[im 12/24  brain]
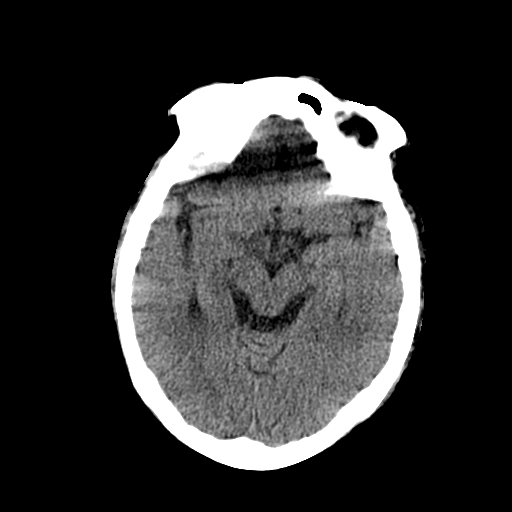

[Series 4: headtrauma 2.4 h60s · axial · 0.47mm/px · z∈[+82,+242]mm · 14 of 72 slices shown, 18 images]
[im 5/72  brain]
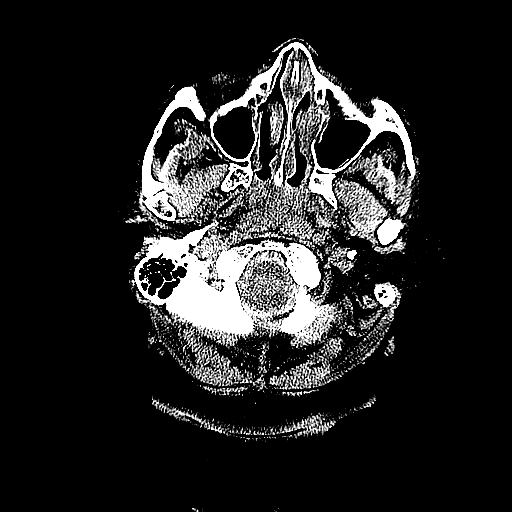
[im 5/72  bone]
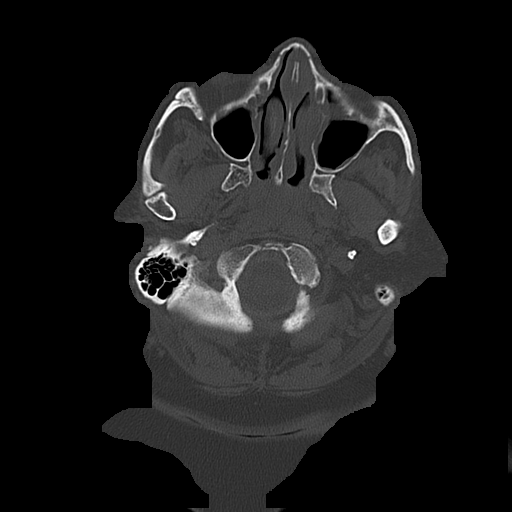
[im 10/72  brain]
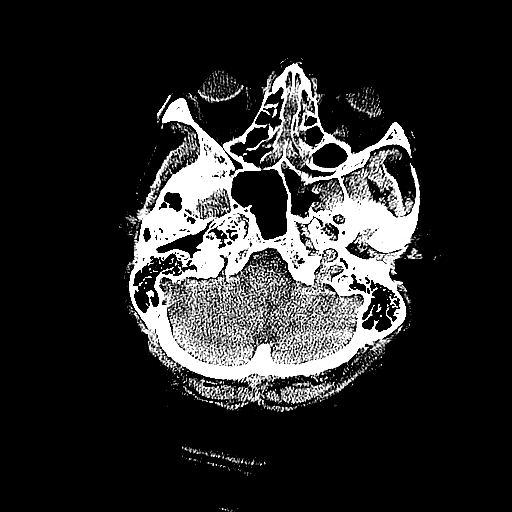
[im 15/72  brain]
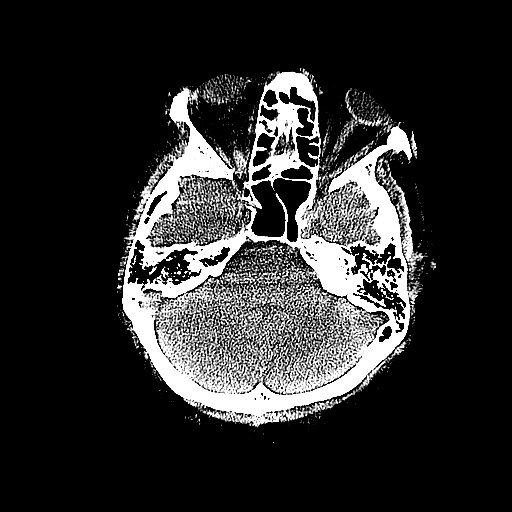
[im 19/72  brain]
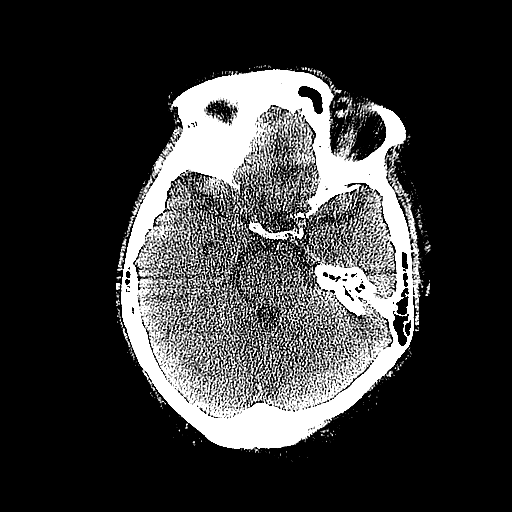
[im 24/72  brain]
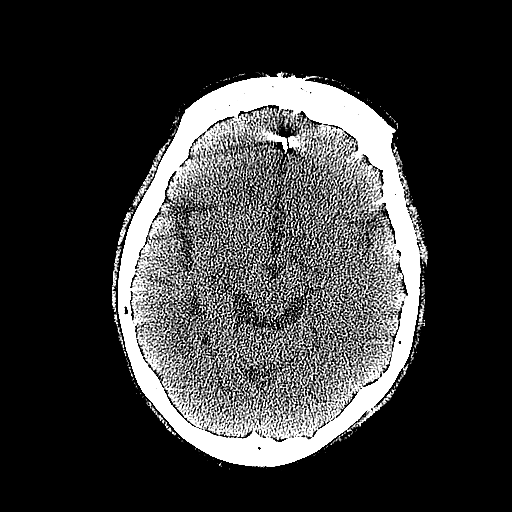
[im 24/72  bone]
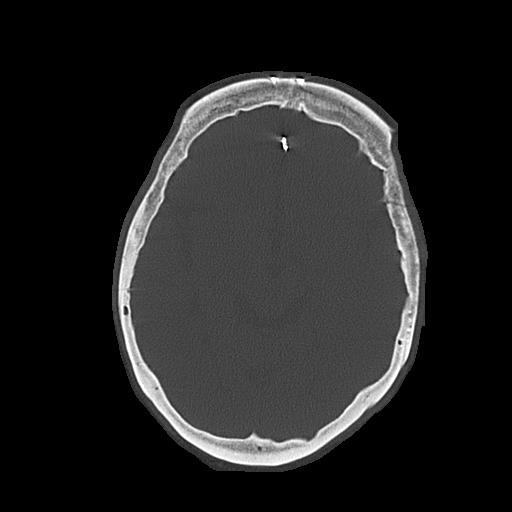
[im 29/72  brain]
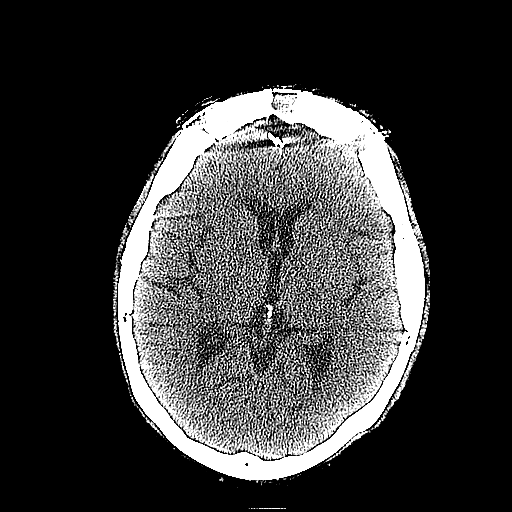
[im 34/72  brain]
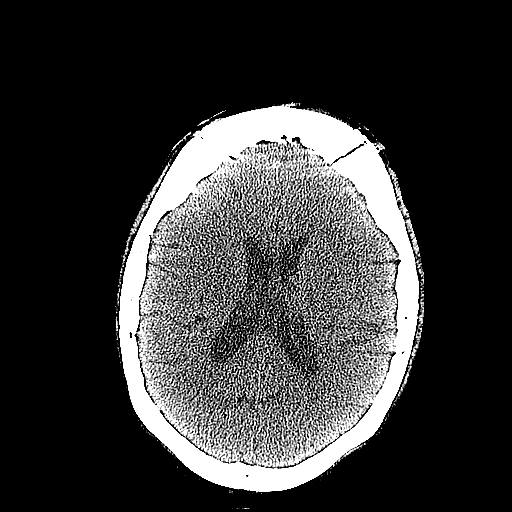
[im 38/72  brain]
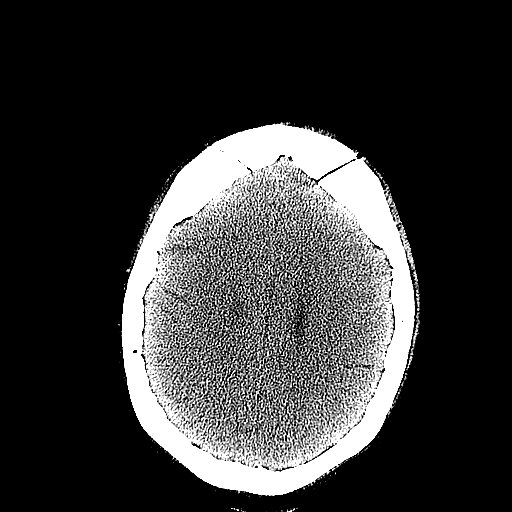
[im 43/72  brain]
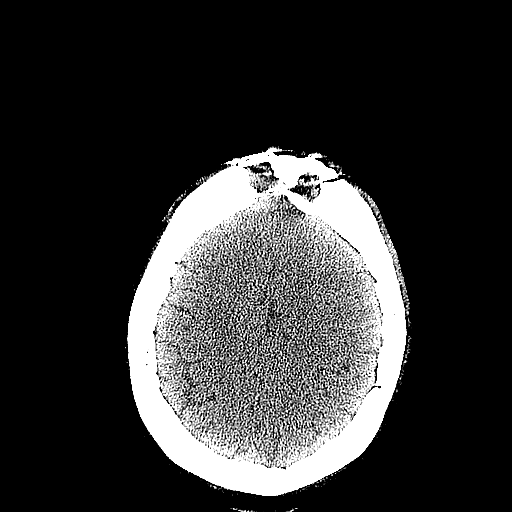
[im 43/72  bone]
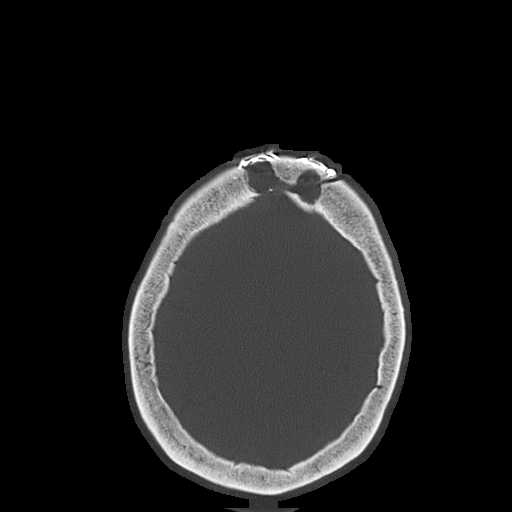
[im 48/72  brain]
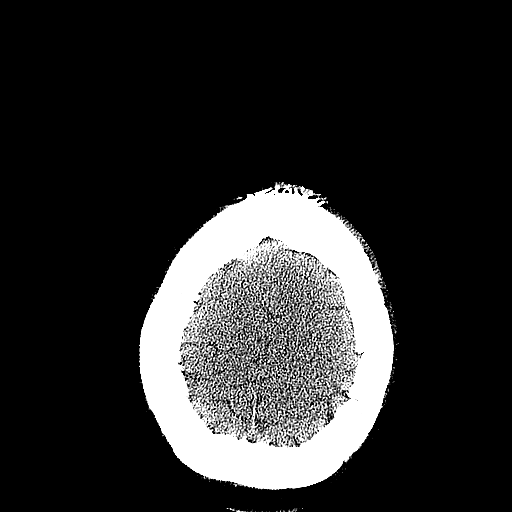
[im 53/72  brain]
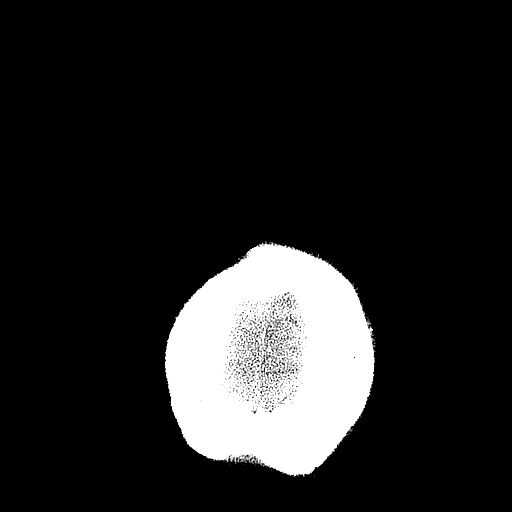
[im 57/72  brain]
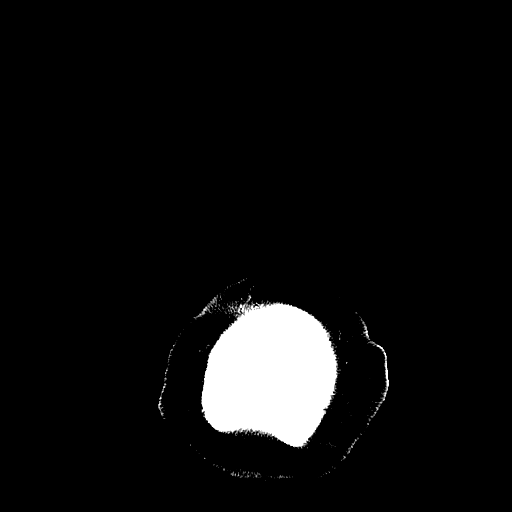
[im 62/72  brain]
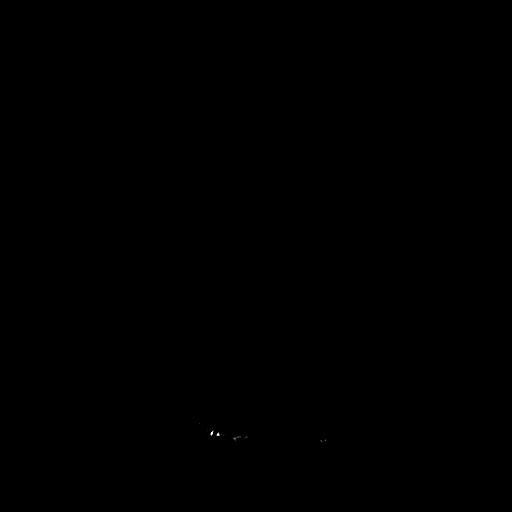
[im 62/72  bone]
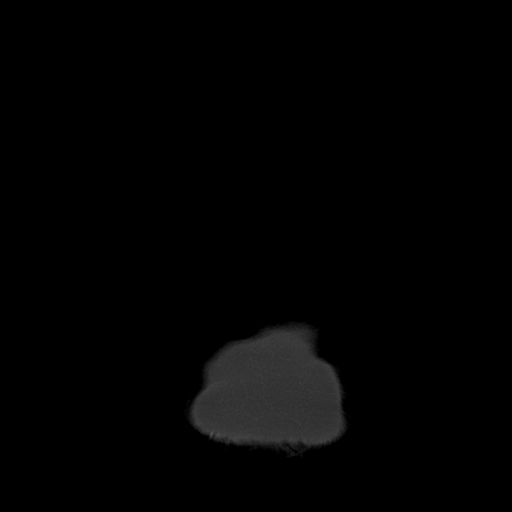
[im 67/72  brain]
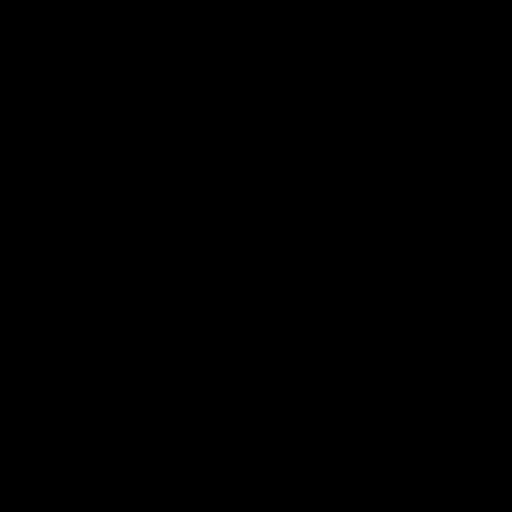

[16 of 30 positions shown; findings below may reference images not displayed]

FINDINGS: Calvarium is intact except for frontal craniotomy.  There
is no evidence of hemorrhage, infarct, or mass.  Clips in the
anterior midline are again identified postoperatively.  There is
multi focal paranasal sinus chronic appearing inflammatory change,
which appears increased when compared to the prior study.
IMPRESSION: Stable postoperative change.  No acute findings.

## 2013-09-09 IMAGING — CR DG CHEST 1V
1 series · 1 of 1 positions shown · non-contrast
Comparison: Some 11/23/2011

CLINICAL DATA: Loss of consciousness

CHEST - 1 VIEW

[view not recorded]
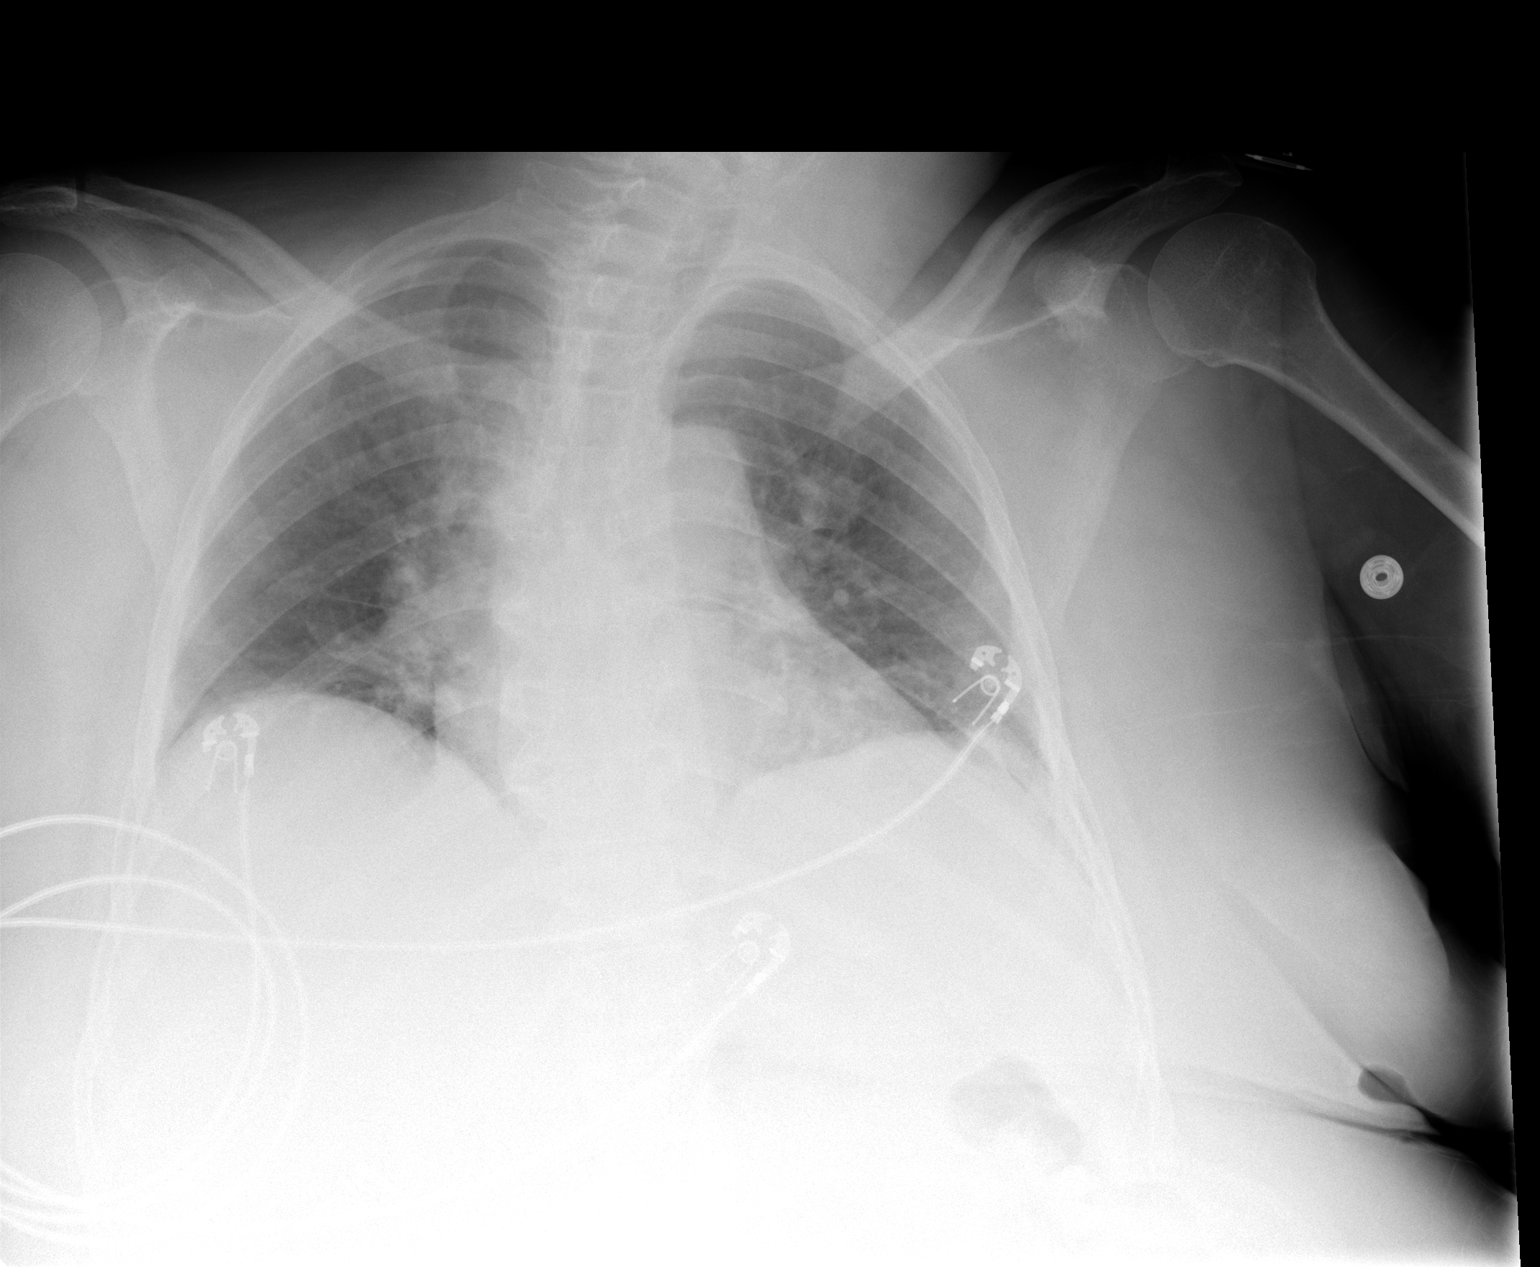

[1 of 1 positions shown; findings below may reference images not displayed]

FINDINGS: Cardiomediastinal silhouette is stable.  The study is
limited by poor inspiration.  Bilateral basilar atelectasis.
Central mild vascular congestion without pulmonary edema.
IMPRESSION: Limited study by poor inspiration.  Mild basilar atelectasis.  No
convincing pulmonary edema.

## 2013-09-11 ENCOUNTER — Telehealth: Payer: Self-pay | Admitting: Family Medicine

## 2013-09-11 DIAGNOSIS — R252 Cramp and spasm: Secondary | ICD-10-CM

## 2013-09-11 NOTE — Addendum Note (Signed)
Addended by: Eual Fines on: 09/11/2013 04:40 PM   Modules accepted: Orders

## 2013-09-11 NOTE — Telephone Encounter (Signed)
Ordered and faxed to lab 

## 2013-09-11 NOTE — Telephone Encounter (Signed)
pls order magnesium level pt c/o cramps keeping her up at night

## 2013-09-15 ENCOUNTER — Other Ambulatory Visit: Payer: Self-pay | Admitting: Family Medicine

## 2013-09-15 ENCOUNTER — Telehealth: Payer: Self-pay | Admitting: *Deleted

## 2013-09-15 ENCOUNTER — Ambulatory Visit (HOSPITAL_COMMUNITY)
Admission: RE | Admit: 2013-09-15 | Discharge: 2013-09-15 | Disposition: A | Discharge status: Home / Self Care | Payer: Medicare HMO | Source: Ambulatory Visit | Attending: Family Medicine | Admitting: Family Medicine

## 2013-09-15 DIAGNOSIS — R05 Cough: Secondary | ICD-10-CM

## 2013-09-15 DIAGNOSIS — R059 Cough, unspecified: Secondary | ICD-10-CM

## 2013-09-15 NOTE — Telephone Encounter (Signed)
Pt called stating her chest started hurting yesterday pt said she needs a chest x-ray ASAP I made pt aware she needs to go to the ER pt stated she did not need to do that, that she needed a chest x-ray, pt is getting blood work done today while her aid is with her she needs to know ASAP if she can have one. Please advise [phone]

## 2013-09-15 NOTE — Telephone Encounter (Signed)
Spoke with patient and she states that she has discomfort when taking a deep breath.  Non productive cough x 2 days.  Order for cxr placed. Will followup with patient.

## 2013-09-15 NOTE — Telephone Encounter (Signed)
Noted and agree. 

## 2013-09-16 ENCOUNTER — Ambulatory Visit (INDEPENDENT_AMBULATORY_CARE_PROVIDER_SITE_OTHER): Payer: Commercial Managed Care - HMO | Admitting: Family Medicine

## 2013-09-16 ENCOUNTER — Telehealth: Payer: Self-pay | Admitting: Family Medicine

## 2013-09-16 ENCOUNTER — Other Ambulatory Visit: Payer: Self-pay | Admitting: Family Medicine

## 2013-09-16 VITALS — BP 150/80 | HR 88 | Resp 16 | Wt 171.0 lb

## 2013-09-16 DIAGNOSIS — E039 Hypothyroidism, unspecified: Secondary | ICD-10-CM

## 2013-09-16 DIAGNOSIS — M5412 Radiculopathy, cervical region: Secondary | ICD-10-CM

## 2013-09-16 DIAGNOSIS — G40909 Epilepsy, unspecified, not intractable, without status epilepticus: Secondary | ICD-10-CM

## 2013-09-16 DIAGNOSIS — M5010 Cervical disc disorder with radiculopathy, unspecified cervical region: Secondary | ICD-10-CM

## 2013-09-16 DIAGNOSIS — J449 Chronic obstructive pulmonary disease, unspecified: Secondary | ICD-10-CM | POA: Insufficient documentation

## 2013-09-16 DIAGNOSIS — IMO0002 Reserved for concepts with insufficient information to code with codable children: Secondary | ICD-10-CM

## 2013-09-16 DIAGNOSIS — Z794 Long term (current) use of insulin: Secondary | ICD-10-CM

## 2013-09-16 DIAGNOSIS — J438 Other emphysema: Secondary | ICD-10-CM

## 2013-09-16 DIAGNOSIS — IMO0001 Reserved for inherently not codable concepts without codable children: Secondary | ICD-10-CM

## 2013-09-16 DIAGNOSIS — J441 Chronic obstructive pulmonary disease with (acute) exacerbation: Secondary | ICD-10-CM

## 2013-09-16 DIAGNOSIS — Z23 Encounter for immunization: Secondary | ICD-10-CM

## 2013-09-16 DIAGNOSIS — E785 Hyperlipidemia, unspecified: Secondary | ICD-10-CM

## 2013-09-16 DIAGNOSIS — E1165 Type 2 diabetes mellitus with hyperglycemia: Secondary | ICD-10-CM

## 2013-09-16 DIAGNOSIS — J4489 Other specified chronic obstructive pulmonary disease: Secondary | ICD-10-CM

## 2013-09-16 DIAGNOSIS — E1065 Type 1 diabetes mellitus with hyperglycemia: Secondary | ICD-10-CM

## 2013-09-16 DIAGNOSIS — I10 Essential (primary) hypertension: Secondary | ICD-10-CM

## 2013-09-16 DIAGNOSIS — F060 Psychotic disorder with hallucinations due to known physiological condition: Secondary | ICD-10-CM

## 2013-09-16 HISTORY — DX: Other specified chronic obstructive pulmonary disease: J44.89

## 2013-09-16 HISTORY — DX: Chronic obstructive pulmonary disease, unspecified: J44.9

## 2013-09-16 LAB — MAGNESIUM: MAGNESIUM: 1.4 mg/dL — AB (ref 1.5–2.5)

## 2013-09-16 MED ORDER — MAGNESIUM OXIDE 250 MG PO TABS: 1.0000 | ORAL_TABLET | Freq: Every day | ORAL | Status: DC

## 2013-09-16 MED ORDER — SPIRONOLACTONE 25 MG PO TABS: 25.0000 mg | ORAL_TABLET | Freq: Every day | ORAL | Status: DC

## 2013-09-16 NOTE — Patient Instructions (Signed)
Annual physical exam in 3 month, call if you need me before  Bilateral ear flush today  Prenvar today  Increase lantus to 65 units daily,   New additional medication for BP spironolactone 25 mg one daily in the morning, continue all others  New for low magnesium is one tablet daily  CXR show no sign of fluid or infection or mass, I have entered a referral for Dr Annamaria Boots to check you as far as increased wheezing and shortness of breath is concerned  HBA1C, chem 7 and EGFr  August 28 or after.I will send the result to Dr Janit Pagan D level will be added on to recent lab  P

## 2013-09-16 NOTE — Telephone Encounter (Signed)
Noted. Addressed

## 2013-09-16 NOTE — Telephone Encounter (Signed)
Patient came in for OV today and was evaluated

## 2013-09-17 LAB — VITAMIN D 25 HYDROXY (VIT D DEFICIENCY, FRACTURES): Vit D, 25-Hydroxy: 64 ng/mL (ref 30–89)

## 2013-09-17 NOTE — Telephone Encounter (Signed)
Called patient and left message for them to return call at the office   

## 2013-09-22 ENCOUNTER — Telehealth: Payer: Self-pay | Admitting: Family Medicine

## 2013-09-22 NOTE — Telephone Encounter (Signed)
Patient aware and states her sugar is doing better and she will call back with any concerns.  (doesn't see Merlene Laughter, she sees [place])

## 2013-09-22 NOTE — Telephone Encounter (Signed)
Needs to discuss with nudleman or [name], I did increase her lantus to 70 units based on weekend call pls ask if her blood sugar is better, also any idea when dr Merlene Laughter will be back so she can get appt there?

## 2013-09-22 NOTE — Telephone Encounter (Signed)
States that her grip is greatly decreased in both hands and both arms are weak too. States she was diagnosed with nerve damage in her arms and legs. Wants to know if this is normal with nerve damage and what she can do about it. Does she need to discuss this with one of her other specialist? Aline Brochure, [place], [place]) Please advise

## 2013-09-23 ENCOUNTER — Encounter: Payer: Self-pay | Admitting: Internal Medicine

## 2013-09-23 ENCOUNTER — Ambulatory Visit (INDEPENDENT_AMBULATORY_CARE_PROVIDER_SITE_OTHER): Payer: Medicare HMO | Admitting: Internal Medicine

## 2013-09-23 VITALS — BP 132/62 | HR 100 | Ht 59.0 in | Wt 172.6 lb

## 2013-09-23 DIAGNOSIS — J4489 Other specified chronic obstructive pulmonary disease: Secondary | ICD-10-CM

## 2013-09-23 DIAGNOSIS — J439 Emphysema, unspecified: Secondary | ICD-10-CM

## 2013-09-23 DIAGNOSIS — J449 Chronic obstructive pulmonary disease, unspecified: Secondary | ICD-10-CM

## 2013-09-23 DIAGNOSIS — G4733 Obstructive sleep apnea (adult) (pediatric): Secondary | ICD-10-CM

## 2013-09-23 DIAGNOSIS — J438 Other emphysema: Secondary | ICD-10-CM

## 2013-09-23 MED ORDER — FLUTICASONE FUROATE-VILANTEROL 100-25 MCG/INH IN AEPB: 1.0000 | INHALATION_SPRAY | Freq: Every day | RESPIRATORY_TRACT | Status: DC

## 2013-09-23 NOTE — Progress Notes (Signed)
06/18/12- 68 yoF referred courtesy of Dr Joycelyn Schmid [name]-sleep study last year per patient. She reports that she was witnessed stopping breathing in sleep, has a history of loud snoring and is a restless sleeper with frequent waking at night, bathroom trips and nightmares. Easy dyspnea on exertion during the day. She does smoke. New cough and sinus pressure this week attributed to "allergy" with green nasal discharge and no headache. She was sent to hospital April 1 when she says she couldn't wake up while at her doctor's office. She sleeps propped up. Gets sleepy in 30 minutes watching TV during the day. Bedtime 10 PM, sleep latency one or 2 hours, waking during the night 3 or 4 times before up at 8 AM. She had a sleep study at [facility] and was given CPAP but says she can't use it because the machine started smoking and she didn't like the pressure. She was changed from [place] to [place]. She is living alone, divorced and disabled. Son has OSA. History of meningioma 11/23/2011. No seizure in over 3 years. Does not drive. NPSG 3/8/13[name]- mild obstructive sleep apnea, AHI 14 per hour, mostly in REM. Epworth sleepiness score 14/24. Weight 164 pounds.  09/26/12- 57 yoF  Former smoker followed for obstructive sleep apnea follows for:  review download from [facility] with pt today.  pt stated that her back has been hurting her.   Did stop smoking! Some wheeze with exertion. She complains that she is falling asleep before she puts her CPAP/ auto/ [employer] on. Download demonstrated poor compliance and control. She had complained of a long history of difficulty falling asleep. Says a neurologist had questioned narcolepsy. She denies cataplexy or sleep paralysis.  11/19/12- 46 yoF  Former smoker followed for obstructive sleep apnea, ? Lung nodule, complicated by hx resection of meningioma FOLLOWS FOR:  Wearing CPAP 8/ [employer] 6-8 hours per night.  Discuss lung nodule was told she had by Dr.  Tula Nakayama Sleep is comfortable with CPAP. Nuvigil sample did not help with daytime alertness. There was questionable lung nodule but not seen on recent CT chest. Incidental slip in bathroom last week- black eye. CT 08/19/12 IMPRESSION:  The opacity in the inferior segment of the lingula of the left  upper lobe is without significant change. It is morphology is  consistent with scarring and / or atelectasis. This does not have  the configuration of a nodule. Other minor areas of reticular  subsegmental atelectasis are stable as well. There is no discrete  lung nodule. The lungs are otherwise clear.  No additional short-term follow-up is felt indicated. There are no  findings suspicious for neoplastic disease.  Original Report Authenticated By: Lajean Manes, M.D.  05/21/13- 51 yoF  Former smoker followed for obstructive sleep apnea, R/O'd Lung nodule, complicated by hx resection of meningioma FOLLOWS FOR: has been using old CPAP machine(has been waiting for [facility] [employer]  to get her outlets changed over for her new CPAP machine). They have done this and still using old CPAP. Pt states Dr Moshe Cipro wants her to have another sleep study. NPSG 05/29/11- AHI 14/ hr CPAP 8/ [employer] Always sleepy with bedtime between 8 and 9 PM, at 7:30 AM. At least twice during the night. "addicted to Ativan"-withdrawal if she skips it. CT chest 12/16/12 IMPRESSION:  Minimal chronic atelectasis versus scarring at the bases of left  lower lobe and lingula.  Otherwise negative exam, with no evidence of pulmonary nodule.  No further workup required.  Electronically Signed  By: Lavonia Dana M.D.  On: 12/16/2012 12:09  08/12/13- 20 yoF Former smoker followed for obstructive sleep apnea, R/O'd Lung nodule, complicated by hx resection of meningioma. Bipolar, GERD, DM Follows for:  Pt here to discuss results of her slpit night sleep study. She is still using her old CPAP machine and is waking up several times per night.    09/23/13- 71 yoF Former smoker followed for obstructive sleep apnea, R/O'd Lung nodule, complicated by hx resection of meningioma. Bipolar, GERD, DM FOLLOWS FOR: Pt states that she is trying to use CPAP 8/ [facility] as much as possible. Only ranging between 2-5hrs at night. Pt states that mask does not fit well. Pt reports that Nuvigil did not work for her.  Pt c/o some chest pressure, wheezing, SOB and fatigue. Asks to try inhaler. Feels she stays awake a lot at night-gets up and reads. CXR 09/15/13 IMPRESSION:  The study is limited due to hypoinflation. Minimal infrahilar  atelectasis on the right is suspected.  Electronically Signed  By: David Martinique  On: 09/15/2013 12:45   ROS-see HPI Constitutional:   No-   weight loss, night sweats, fevers, chills, +fatigue, lassitude. HEENT:  + headaches, +difficulty swallowing, tooth/dental problems, sore throat,       +  sneezing, itching, ear ache, +nasal congestion, post nasal drip,  CV:  No-   chest pain, orthopnea, PND, swelling in lower extremities, anasarca, dizziness, palpitations Resp: +  shortness of breath with exertion or at rest.              +  productive cough,  No non-productive cough,  No- coughing up of blood.              No-   change in color of mucus.  + wheezing.   Skin: No-   rash or lesions. GI:  No-   heartburn, +indigestion, no-abdominal pain, nausea, vomiting,  GU:  MS:  No-   joint pain or swelling.   Neuro-     nothing unusual Psych:  No- change in mood or affect. No depression or anxiety.  No memory loss.  OBJ- Physical Exam - + drowsy during conversation, responses are slow but appropriate General-  Oriented, Affect-flat, Distress- none acute Skin- rash-none, lesions- none, excoriation- none Lymphadenopathy- none Head- Eyes- Gross vision intact, PERRLA, conjunctivae and secretions clear            Ears- Hearing, canals-normal            Nose- Clear, no-Septal dev, mucus, polyps, erosion, perforation              Throat- Mallampati III , mucosa clear , drainage- none, tonsils- atrophic, + dentures Neck- flexible , trachea midline, no stridor , thyroid nl, carotid no bruit Chest - symmetrical excursion , unlabored           Heart/CV- RRR , no murmur , no gallop  , no rub, nl s1 s2                           - JVD- none , edema- none, stasis changes- none, varices- none           Lung- clear to P&A, wheeze- none, cough+rattling , dullness-none, rub- none           Chest wall-  Abd- soft Br/ Gen/ Rectal- Not done, not indicated Extrem- cyanosis- none, clubbing, none, atrophy- none, strength- nl. +cane Neuro- +  somewhat slow but oriented, + drowsy

## 2013-09-23 NOTE — Patient Instructions (Addendum)
We want to get you back to using your CPAP all night, every night. Please follow through with [facility] to get your mask replaced so it is comfortable and works well for you. You can work with [name] to try different types of CPAP masks.   Sample Breo Ellipta to try, 1 puff, one time daily then rinse your mouth    See if this helps your wheezing shortness of breath

## 2013-09-25 NOTE — Telephone Encounter (Signed)
Wants an order for a hospital bed sent to [facility]. Must have appended office notes stating the need. Pt states she has been sleeping in a recliner for months.

## 2013-09-26 ENCOUNTER — Telehealth: Payer: Self-pay

## 2013-09-26 NOTE — Telephone Encounter (Signed)
Patient states her sugars are still running high. Fasting 257, 271, 248. Takes 70 units of lantus in the afternoon at 4pm and tradjenta. Please advise is med change is needed

## 2013-09-30 ENCOUNTER — Telehealth: Payer: Self-pay | Admitting: Family Medicine

## 2013-09-30 NOTE — Telephone Encounter (Signed)
pls send three times daily fior 2 months only pls, script given

## 2013-09-30 NOTE — Telephone Encounter (Signed)
Increase to 80 units lantus please let her know

## 2013-09-30 NOTE — Telephone Encounter (Signed)
Will send to [facility] as requested

## 2013-09-30 NOTE — Telephone Encounter (Signed)
Verify that she can even get one covered since last one pls, then also need to review reason that would qualify her for a bed, if she does qualify  When paper work completed as able , i will sign, thanks

## 2013-09-30 NOTE — Telephone Encounter (Signed)
These are her fasting sugars on 70 units of lantus and tradjenta. Today was 271. Please advise

## 2013-09-30 NOTE — Telephone Encounter (Signed)
pls check on fasting sugars if good on 17 units document and close the message , if not send me back info, thanks

## 2013-09-30 NOTE — Telephone Encounter (Signed)
She wants testing supplies sent to [facility] but she sees [place]. Ok to prescribe?

## 2013-10-01 NOTE — Telephone Encounter (Signed)
[name] needs an Rx with diagnosis AND her last office note stating the need. Patient states she gets SOB if she lays in regular bed. Has to sleep in recliner. Has chronic back pain and nerve damage in her arms and legs. Has trouble getting out of a regular bed due to this.

## 2013-10-01 NOTE — Telephone Encounter (Signed)
Pt aware.

## 2013-10-02 ENCOUNTER — Telehealth: Payer: Self-pay | Admitting: Family Medicine

## 2013-10-02 IMAGING — CT CT HEAD W/O CM
1 of 2 series · 13 of 30 positions shown, 17 images · non-contrast
Comparison: CT of the head performed 02/15/2012

CLINICAL DATA: Status post fall; weakness and headache.

CT HEAD WITHOUT CONTRAST
TECHNIQUE: Contiguous axial images were obtained from the base of
the skull through the vertex without contrast.

[Series 2: headseq 4.8 h37s · axial · 0.45mm/px · z∈[+36,+183]mm · 13 of 36 slices shown, 17 images]
[im 3/36  brain]
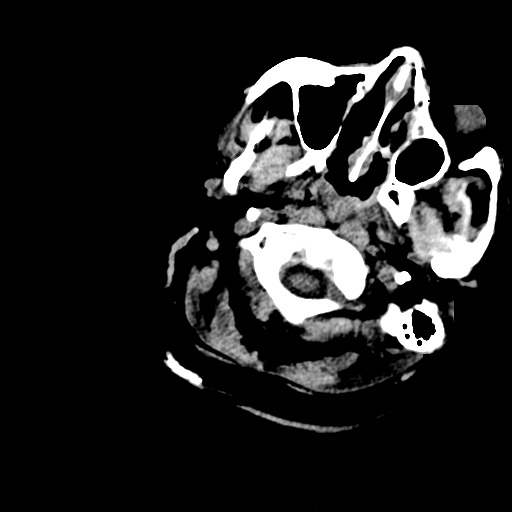
[im 3/36  bone]
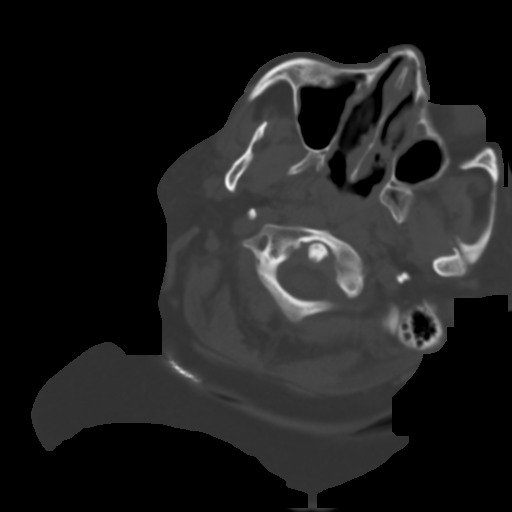
[im 6/36  brain]
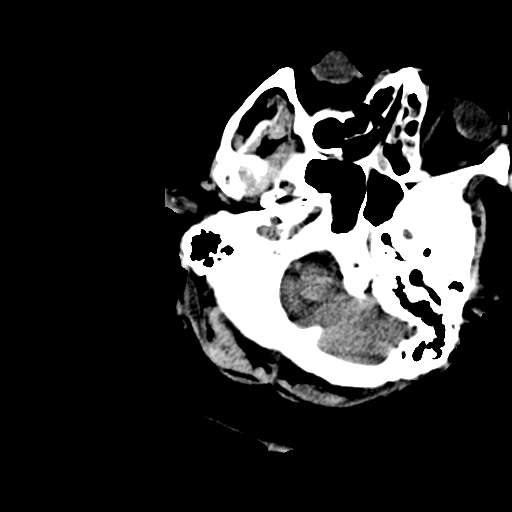
[im 8/36  brain]
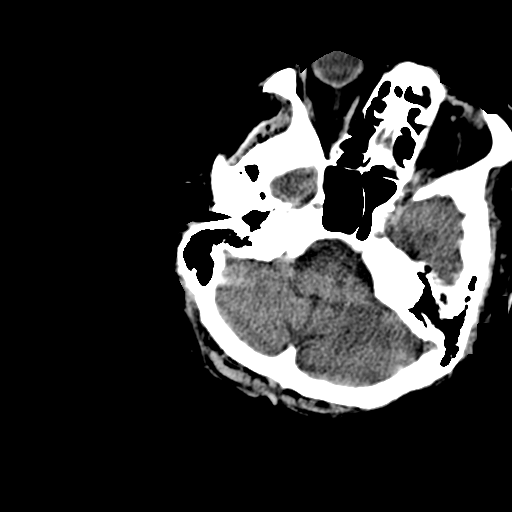
[im 11/36  brain]
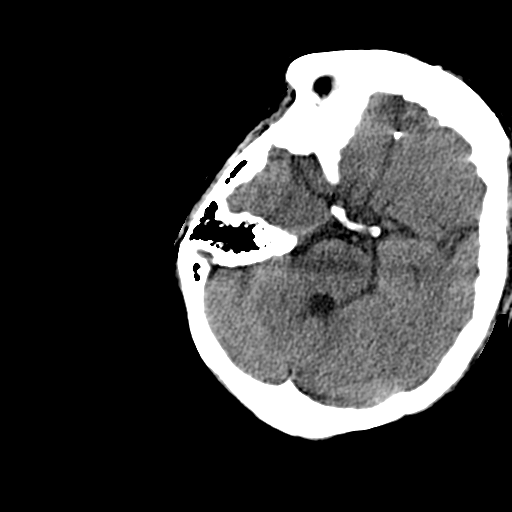
[im 13/36  brain]
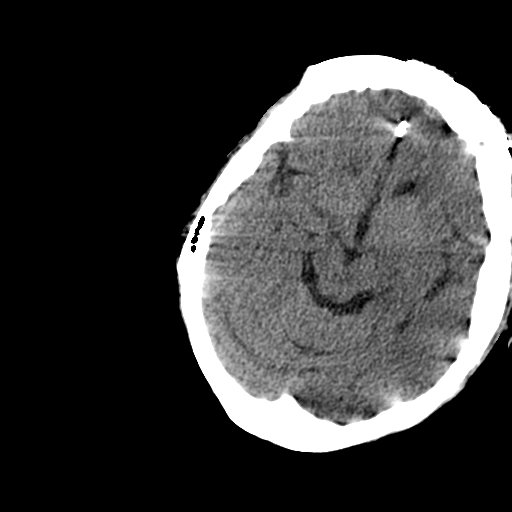
[im 13/36  bone]
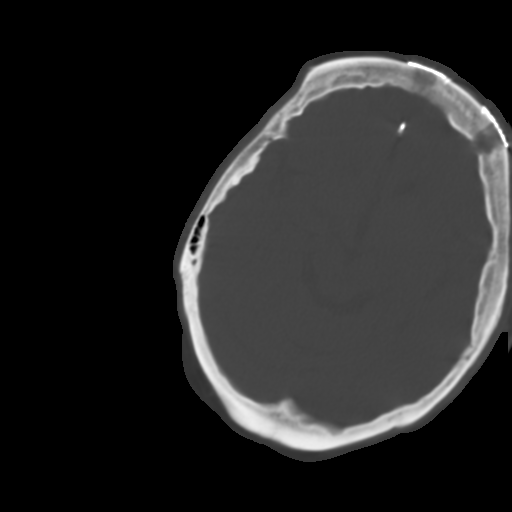
[im 16/36  brain]
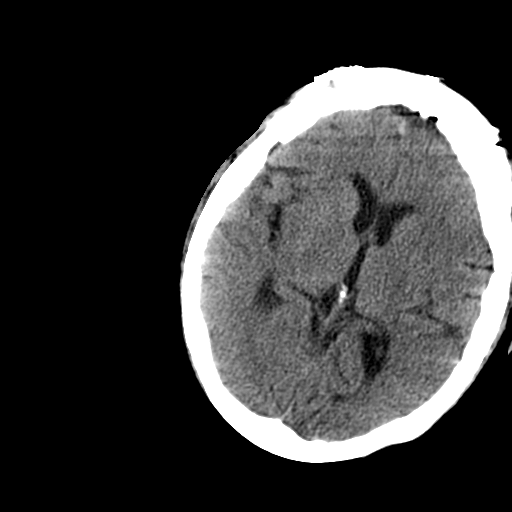
[im 18/36  brain]
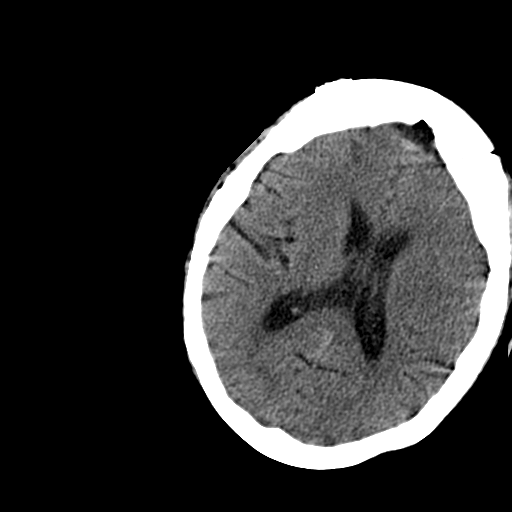
[im 21/36  brain]
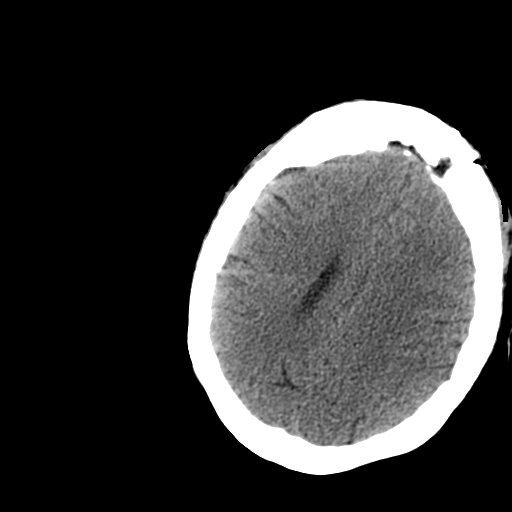
[im 23/36  brain]
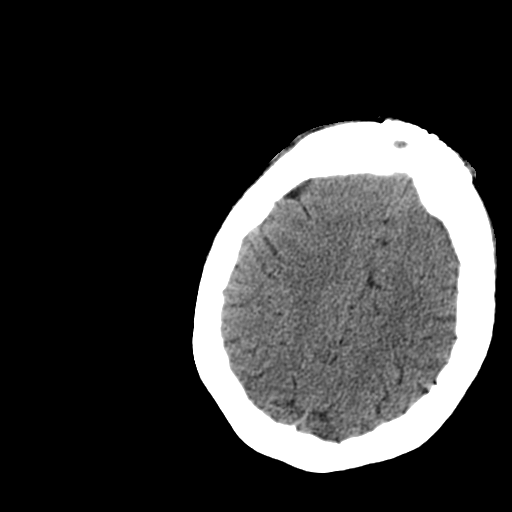
[im 23/36  bone]
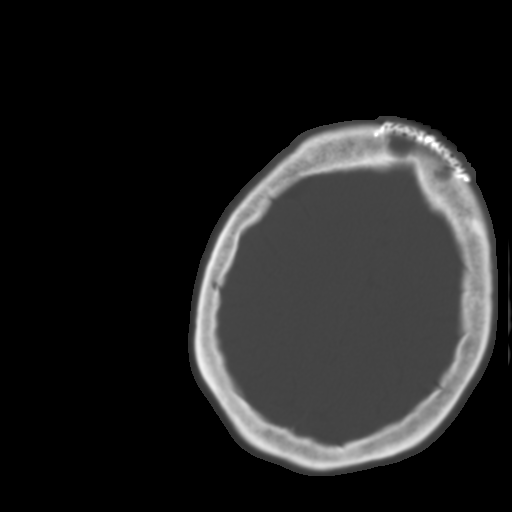
[im 26/36  brain]
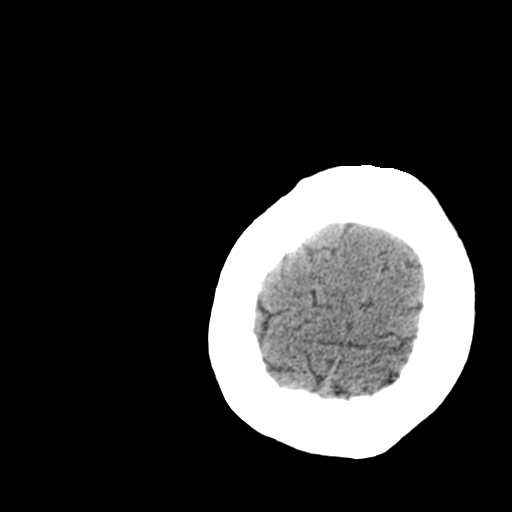
[im 28/36  brain]
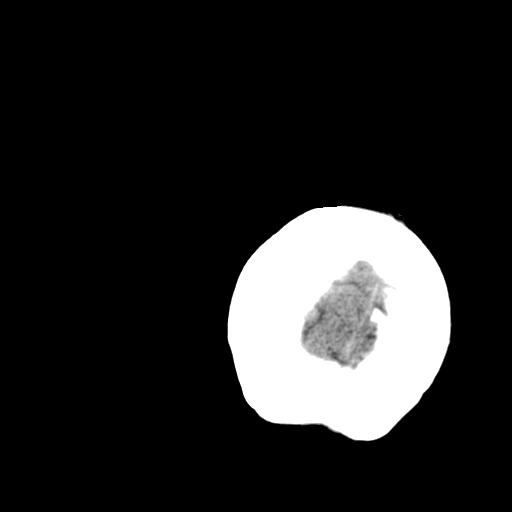
[im 31/36  brain]
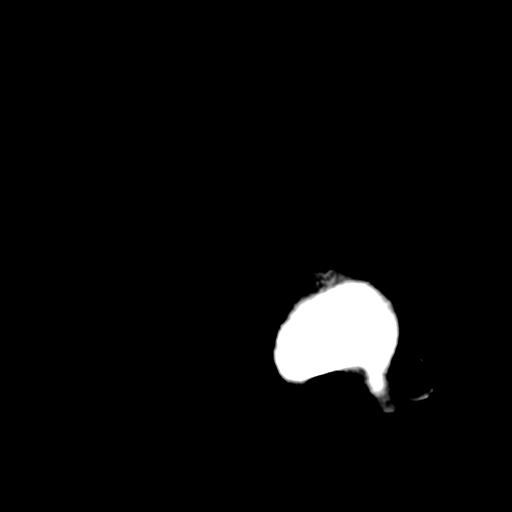
[im 33/36  brain]
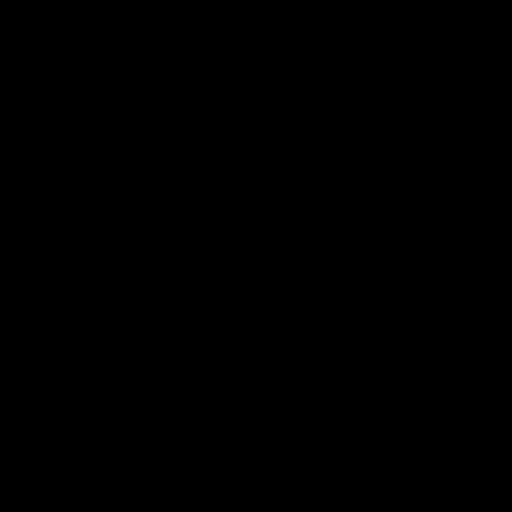
[im 33/36  bone]
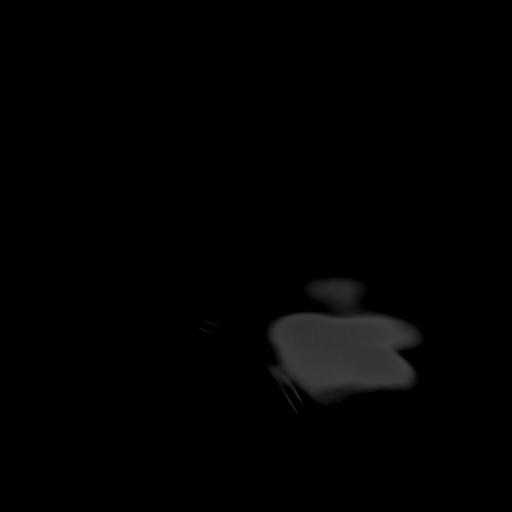

[13 of 30 positions shown; findings below may reference images not displayed]

FINDINGS: There is no evidence of acute infarction, mass lesion, or
intra- or extra-axial hemorrhage on CT.

Postoperative changes along the frontal lobes are grossly stable in
appearance.  Mild periventricular white matter change may reflect
small vessel ischemic microangiopathy.

The posterior fossa, including the cerebellum, brainstem and fourth
ventricle, is within normal limits.  The third and lateral
ventricles, and basal ganglia are unremarkable in appearance.  The
cerebral hemispheres demonstrate grossly normal gray-white
differentiation.  No mass effect or midline shift is seen.

There is no evidence of fracture.  The patient is status post
frontal craniotomy; the flap is grossly unchanged in appearance.
The orbits are within normal limits.  Mild mucosal thickening is
noted at the maxillary sinuses; the remaining paranasal sinuses and
mastoid air cells are well-aerated.  No significant soft tissue
abnormalities are seen.
IMPRESSION: 1.  No evidence of traumatic intracranial injury or fracture.
2.  Postoperative changes along the frontal lobes are grossly
stable.
3.  Mild mucosal thickening at the maxillary sinuses.

## 2013-10-02 NOTE — Telephone Encounter (Signed)
Stated that she can get another bed [employer] stated that they would pay 80% she just needs a Rx for a new bed send to [facility]

## 2013-10-02 NOTE — Telephone Encounter (Signed)
Also a  bed rail go to [facility]

## 2013-10-02 NOTE — Telephone Encounter (Signed)
Will address this

## 2013-10-02 NOTE — Telephone Encounter (Signed)
Patient states she has called her insurance and they are telling her they will pay 80% on a new bed. Patient wants rx sent to [place]. I tried to explain to her but she insists they say otherwise.

## 2013-10-03 LAB — HM DIABETES EYE EXAM

## 2013-10-14 ENCOUNTER — Encounter: Payer: Self-pay | Admitting: Family Medicine

## 2013-10-14 NOTE — Assessment & Plan Note (Signed)
Controlled, npo recent seizure activity, continue treatment through neurologist

## 2013-10-14 NOTE — Assessment & Plan Note (Signed)
Updated lab needed at/ before next visit.Record to treating MD , Nida Patient educated about the importance of limiting  Carbohydrate intake , the need to commit to daily physical activity for a minimum of 30 minutes , and to commit weight loss. The fact that changes in all these areas will reduce or eliminate all together the development of diabetes is stressed.   Lantus dose increased due to elevated readings, diet reviewed, encouraged to attend in house diabetic class

## 2013-10-14 NOTE — Assessment & Plan Note (Signed)
Stable and treated by mental health

## 2013-10-14 NOTE — Assessment & Plan Note (Signed)
Uncontrolled, add spironolactone DASH diet and commitment to daily physical activity for a minimum of 30 minutes discussed and encouraged, as a part of hypertension management. The importance of attaining a healthy weight is also discussed. Nurse BP check needed

## 2013-10-14 NOTE — Assessment & Plan Note (Signed)
Chronic pain management is through a pain clinic, shei to continue this

## 2013-10-14 NOTE — Progress Notes (Signed)
Subjective:    Patient ID: [name], female    DOB: 1951/01/18, 63 y.o.   MRN: [id number]  HPI The PT is here for follow up and re-evaluation of chronic medical conditions, medication management and review of any available recent lab and radiology data.  Preventive health is updated, specifically  Cancer screening and Immunization.   The PT denies any adverse reactions to current medications since the last visit.  C/o increased shortness of breath and wheezing in the past 2 to 3 weeks, denies fever or chills C/o uncontrolled blood sugars , FBG generally over 200, despite care with diet  Satisfied with new psychiatrist, now getting accustomed to her Provider, also follows with pain clinic for spine pain and overall doing well with this    Review of Systems See HPI Denies recent fever or chills. Denies sinus pressure, nasal congestion, ear pain or sore throat. Denies chest congestion or  productive cough she does have  Wheezing and increas exertional fatigue Denies chest pains, palpitations and leg swelling Denies abdominal pain, nausea, vomiting,diarrhea or constipation.   Denies dysuria, frequency, hesitancy or incontinence. C/o chronic joint pain,  and limitation in mobility.however pain is adequately controlled  Denies headaches, or uncontrolled seizures, numbness, or tingling. Denies  uncontrolled depression, anxiety or insomnia. Denies skin break down or rash.        Objective:   Physical Exam BP 150/80  Pulse 88  Resp 16  Wt 171 lb (77.565 kg)  SpO2 99% Patient alert and oriented and in no cardiopulmonary distress.  HEENT: No facial asymmetry, EOMI,   oropharynx pink and moist.  Neck decreased ROM,no JVD, no mass.  Chest: Clear to auscultation bilaterally.No crackles or wheezes. Decreased though adequate air entry  CVS: S1, S2 no murmurs, no S3.Regular rate.  ABD: Soft non tender.   Ext: No edema  MS: decreased ROM spine, adequate in  shoulders, hips and  knees.  Skin: Intact, no ulcerations or rash noted.  Psych: Good eye contact, normal affect. Memory intact not anxious or depressed appearing.  CNS: CN 2-12 intact, power,  normal throughout.no focal deficits noted.        Assessment & Plan:  HYPERTENSION Uncontrolled, add spironolactone DASH diet and commitment to daily physical activity for a minimum of 30 minutes discussed and encouraged, as a part of hypertension management. The importance of attaining a healthy weight is also discussed. Nurse BP check needed  Diabetes mellitus, insulin dependent (IDDM), uncontrolled Updated lab needed at/ before next visit.Record to treating MD , Nida Patient educated about the importance of limiting  Carbohydrate intake , the need to commit to daily physical activity for a minimum of 30 minutes , and to commit weight loss. The fact that changes in all these areas will reduce or eliminate all together the development of diabetes is stressed.   Lantus dose increased due to elevated readings, diet reviewed, encouraged to attend in house diabetic class   Seizure disorder Controlled, npo recent seizure activity, continue treatment through neurologist  HYPERLIPIDEMIA Controlled, cholesterol no change in medication. TG are elevated , dietary modification needed Hyperlipidemia:Low fat diet discussed and encouraged.    PSYCHOTIC D/O W/HALLUCINATIONS CONDS CLASS ELSW Stable and treated by mental health  Unspecified hypothyroidism Controlled, treated by endo, updated lab has been scanned in from outside source  Cervical disc disorder with radiculopathy of cervical region Chronic pain management is through a pain clinic, shei to continue this  COPD (chronic obstructive pulmonary disease) Uncontroled, reports increased  wheeze and dyspnea in past 2 to 3 weeks, will refer to pulmonary for re eval. Clinical exam at visit is unremarkable for pulmonary distress

## 2013-10-14 NOTE — Assessment & Plan Note (Signed)
Controlled, cholesterol no change in medication. TG are elevated , dietary modification needed Hyperlipidemia:Low fat diet discussed and encouraged.

## 2013-10-14 NOTE — Assessment & Plan Note (Signed)
Uncontroled, reports increased wheeze and dyspnea in past 2 to 3 weeks, will refer to pulmonary for re eval. Clinical exam at visit is unremarkable for pulmonary distress

## 2013-10-14 NOTE — Assessment & Plan Note (Signed)
Controlled, treated by endo, updated lab has been scanned in from outside source

## 2013-10-15 NOTE — Assessment & Plan Note (Signed)
Her MS LT confirms pathologic daytime sleepiness but is nonspecific because she had to take her antipsychotic medications. REM pressure was not increased-percentage time in REM was not increased during the nighttime study and there was no REM during her naps. She does describe what may be cataplexy which would indicate REM sleep intrusion during the day while awake. Plan-try sample Nuvigil although it is unlikely to be affordable. We would like to see if it helps. Emphasized her responsibilities as a sleepy person. [employer] printout provided for her narcolepsy and sleep apnea.

## 2013-10-15 NOTE — Assessment & Plan Note (Signed)
Her mild obstructive sleep apnea is not sufficient explanation for her hypersomnolence,but treatment can only help Plan-auto titrate CPAP for pressure check with compliance discussed

## 2013-10-20 ENCOUNTER — Encounter: Payer: Self-pay | Admitting: Internal Medicine

## 2013-10-27 ENCOUNTER — Telehealth: Payer: Self-pay

## 2013-10-28 ENCOUNTER — Telehealth: Payer: Self-pay | Admitting: Family Medicine

## 2013-10-28 ENCOUNTER — Telehealth: Payer: Self-pay | Admitting: Internal Medicine

## 2013-10-28 DIAGNOSIS — F319 Bipolar disorder, unspecified: Secondary | ICD-10-CM

## 2013-10-28 DIAGNOSIS — E039 Hypothyroidism, unspecified: Secondary | ICD-10-CM

## 2013-10-28 DIAGNOSIS — E119 Type 2 diabetes mellitus without complications: Secondary | ICD-10-CM

## 2013-10-28 DIAGNOSIS — E1165 Type 2 diabetes mellitus with hyperglycemia: Secondary | ICD-10-CM

## 2013-10-28 DIAGNOSIS — I129 Hypertensive chronic kidney disease with stage 1 through stage 4 chronic kidney disease, or unspecified chronic kidney disease: Secondary | ICD-10-CM

## 2013-10-28 DIAGNOSIS — IMO0001 Reserved for inherently not codable concepts without codable children: Secondary | ICD-10-CM

## 2013-10-28 DIAGNOSIS — N189 Chronic kidney disease, unspecified: Secondary | ICD-10-CM

## 2013-10-28 DIAGNOSIS — Z794 Long term (current) use of insulin: Secondary | ICD-10-CM

## 2013-10-28 NOTE — Telephone Encounter (Signed)
Wants to know if you will take over her diabetes management and refer her to Dr Bary Leriche for her thyroid problems. Please advise

## 2013-10-28 NOTE — Telephone Encounter (Signed)
Pt called today to make her follow up OV with RMR ONLY. She was offered 9/22 at 3 but she does not want to wait that long. I have put her on the schedule with RMR on 9/8 at 9 which is a banding day for RMR here in the office. I asked CM and she said yes and to send RMR a phone note letting him know.

## 2013-10-28 NOTE — Telephone Encounter (Signed)
Not due until aug 28th. Left message on machine

## 2013-10-29 NOTE — Telephone Encounter (Signed)
She needs to stay with endo for both problems. Ok to refer her to Dr Carmela Rima for both , pls let her know

## 2013-10-29 NOTE — Telephone Encounter (Signed)
Pls send me a message if my impression that pt does not neeed /qualify for a bed at this time, or any equipment for the bed at this time is correct. Got that impression based on conversation you reported having with her HHN If this is so , just sign off no need to send a response, if she still needs/qualifies for abed, pls send me a message, thanks

## 2013-10-30 ENCOUNTER — Ambulatory Visit (INDEPENDENT_AMBULATORY_CARE_PROVIDER_SITE_OTHER)
Admission: RE | Admit: 2013-10-30 | Discharge: 2013-10-30 | Disposition: A | Discharge status: Home / Self Care | Payer: Commercial Managed Care - HMO | Source: Ambulatory Visit | Attending: Internal Medicine | Admitting: Internal Medicine

## 2013-10-30 ENCOUNTER — Encounter: Payer: Self-pay | Admitting: Internal Medicine

## 2013-10-30 ENCOUNTER — Ambulatory Visit (INDEPENDENT_AMBULATORY_CARE_PROVIDER_SITE_OTHER): Payer: Commercial Managed Care - HMO | Admitting: Internal Medicine

## 2013-10-30 VITALS — BP 122/62 | HR 75 | Ht 59.0 in | Wt 175.6 lb

## 2013-10-30 DIAGNOSIS — J45901 Unspecified asthma with (acute) exacerbation: Secondary | ICD-10-CM

## 2013-10-30 DIAGNOSIS — J449 Chronic obstructive pulmonary disease, unspecified: Secondary | ICD-10-CM

## 2013-10-30 DIAGNOSIS — G4733 Obstructive sleep apnea (adult) (pediatric): Secondary | ICD-10-CM

## 2013-10-30 DIAGNOSIS — K219 Gastro-esophageal reflux disease without esophagitis: Secondary | ICD-10-CM

## 2013-10-30 MED ORDER — MOMETASONE FURO-FORMOTEROL FUM 100-5 MCG/ACT IN AERO: 2.0000 | INHALATION_SPRAY | Freq: Two times a day (BID) | RESPIRATORY_TRACT | Status: DC

## 2013-10-30 NOTE — Assessment & Plan Note (Signed)
Review and emphasis on reflux precaution

## 2013-10-30 NOTE — Assessment & Plan Note (Signed)
Continues to use CPAP all night, every night and does not like to sleep without it now.

## 2013-10-30 NOTE — Assessment & Plan Note (Addendum)
Office Spirometry 10/30/2013-submaximal effort based on appearance of loop and curve. Numbers would fit with severe restriction but her physiologic capability may be better than this. FVC 0.91/44%, and 10.74/45%, FEV1/FVC 0.81, FEF 25-75% 1.43/69% Not clear why she reports wheezing and exertional dyspnea just in the last 4 weeks with no specific trigger recognized. She does not appear to be fluid overloaded. Watch for explanations. For now, I noticed some expiratory wheeze and we will address this as a COPD with bronchitis exacerbation. Plan-chest x-ray, sample Breo Ellipta, sample Dulera 100 to compare, with discussion

## 2013-10-30 NOTE — Patient Instructions (Signed)
Order- offcce spirometry   Dx asthma with bronchitis  Order CXR   Dx asthma with bronchitis, question aspiration  Sample Dulera 100 inhaler 2 puffs then rinse mouth, twice daily

## 2013-10-30 NOTE — Progress Notes (Signed)
06/18/12- 68 yoF referred courtesy of Dr Joycelyn Schmid [name]-sleep study last year per patient. She reports that she was witnessed stopping breathing in sleep, has a history of loud snoring and is a restless sleeper with frequent waking at night, bathroom trips and nightmares. Easy dyspnea on exertion during the day. She does smoke. New cough and sinus pressure this week attributed to "allergy" with green nasal discharge and no headache. She was sent to hospital April 1 when she says she couldn't wake up while at her doctor's office. She sleeps propped up. Gets sleepy in 30 minutes watching TV during the day. Bedtime 10 PM, sleep latency one or 2 hours, waking during the night 3 or 4 times before up at 8 AM. She had a sleep study at [facility] and was given CPAP but says she can't use it because the machine started smoking and she didn't like the pressure. She was changed from [place] to [place]. She is living alone, divorced and disabled. Son has OSA. History of meningioma 11/23/2011. No seizure in over 3 years. Does not drive. NPSG 3/8/13[name]- mild obstructive sleep apnea, AHI 14 per hour, mostly in REM. Epworth sleepiness score 14/24. Weight 164 pounds.  09/26/12- 57 yoF  Former smoker followed for obstructive sleep apnea follows for:  review download from [facility] with pt today.  pt stated that her back has been hurting her.   Did stop smoking! Some wheeze with exertion. She complains that she is falling asleep before she puts her CPAP/ auto/ [employer] on. Download demonstrated poor compliance and control. She had complained of a long history of difficulty falling asleep. Says a neurologist had questioned narcolepsy. She denies cataplexy or sleep paralysis.  11/19/12- 46 yoF  Former smoker followed for obstructive sleep apnea, ? Lung nodule, complicated by hx resection of meningioma FOLLOWS FOR:  Wearing CPAP 8/ [employer] 6-8 hours per night.  Discuss lung nodule was told she had by Dr.  Tula Nakayama Sleep is comfortable with CPAP. Nuvigil sample did not help with daytime alertness. There was questionable lung nodule but not seen on recent CT chest. Incidental slip in bathroom last week- black eye. CT 08/19/12 IMPRESSION:  The opacity in the inferior segment of the lingula of the left  upper lobe is without significant change. It is morphology is  consistent with scarring and / or atelectasis. This does not have  the configuration of a nodule. Other minor areas of reticular  subsegmental atelectasis are stable as well. There is no discrete  lung nodule. The lungs are otherwise clear.  No additional short-term follow-up is felt indicated. There are no  findings suspicious for neoplastic disease.  Original Report Authenticated By: Lajean Manes, M.D.  05/21/13- 51 yoF  Former smoker followed for obstructive sleep apnea, R/O'd Lung nodule, complicated by hx resection of meningioma FOLLOWS FOR: has been using old CPAP machine(has been waiting for [employer]  to get her outlets changed over for her new CPAP machine). They have done this and still using old CPAP. Pt states Dr Moshe Cipro wants her to have another sleep study. NPSG 05/29/11- AHI 14/ hr CPAP 8/ [employer] Always sleepy with bedtime between 8 and 9 PM, at 7:30 AM. At least twice during the night. "addicted to Ativan"-withdrawal if she skips it. CT chest 12/16/12 IMPRESSION:  Minimal chronic atelectasis versus scarring at the bases of left  lower lobe and lingula.  Otherwise negative exam, with no evidence of pulmonary nodule.  No further workup required.  Electronically Signed  By: Lavonia Dana M.D.  On: 12/16/2012 12:09  08/12/13- 34 yoF Former smoker followed for obstructive sleep apnea, R/O'd Lung nodule, complicated by hx resection of meningioma. Bipolar, GERD, DM Follows for:  Pt here to discuss results of her slpit night sleep study. She is still using her old CPAP machine and is waking up several times per night.    09/23/13- 87 yoF Former smoker followed for obstructive sleep apnea, R/O'd Lung nodule, complicated by hx resection of meningioma. Bipolar, GERD, DM FOLLOWS FOR: Pt states that she is trying to use CPAP 8/ [employer] as much as possible. Only ranging between 2-5hrs at night. Pt states that mask does not fit well. Pt reports that Nuvigil did not work for her. Pt c/o some chest pressure, wheezing, SOB and fatigue.   10/30/13- 6 yoF Former smoker followed for obstructive sleep apnea with hypersomnia, R/O'd Lung nodule, complicated by hx resection of meningioma. Bipolar, GERD, DM Referred by Dr Moshe Cipro for new problem of wheezing dyspnea Remote history of pneumonia with no history of asthma or other known lung disease. She had smoked for 15 or 20 years, stopping in 2014. The patient complains of shortness of breath with exertion and wheezing noted especially in the last 4 weeks with no significant triggering event. She does not recognize that she had an infection. She will wheeze some sitting quietly but has not noticed wheezing lying down, when she is wearing her CPAP. CPAP 8/ [employer]- All night every night Gets "strangled" occasionally trying to swallow a pill or food. Has some history of reflux Office Spirometry 10/30/2013-submaximal effort based on appearance of loop and curve. Numbers would fit with severe restriction but her physiologic capability may be better than this. FVC 0.91/44%, and 10.74/45%, FEV1/FVC 0.81, FEF 25-75% 1.43/69%. CXR 09/15/13 IMPRESSION:  The study is limited due to hypoinflation. Minimal infrahilar  atelectasis on the right is suspected.  Electronically Signed  By: David Martinique  On: 09/15/2013 12:45  ROS-see HPI Constitutional:   No-   weight loss, night sweats, fevers, chills, +fatigue, lassitude. HEENT:  + headaches, +difficulty swallowing, tooth/dental problems, sore throat,       +  sneezing, itching, +ear ache, +nasal congestion, post nasal drip,  CV:  +chest pain,  orthopnea, PND, +swelling in lower extremities, anasarca, dizziness, +palpitations Resp: +  shortness of breath with exertion or at rest.                productive cough,  + non-productive cough,  No- coughing up of blood.              No-   change in color of mucus.  No- wheezing.   Skin: No-   rash or lesions. GI:  +heartburn, +indigestion, no-abdominal pain, nausea, vomiting,  GU:  MS:  + joint pain or swelling.   Neuro-     nothing unusual Psych:  No- change in mood or affect. +depression or +anxiety.  No memory loss.  OBJ- Physical Exam - more alert than usual General-  Oriented, Affect-flat, Distress- none acute Skin- rash-none, lesions- none, excoriation- none Lymphadenopathy- none Head- Eyes- Gross vision intact, PERRLA, conjunctivae and secretions clear            Ears- Hearing, canals-normal            Nose- Clear, no-Septal dev, mucus, polyps, erosion, perforation             Throat- Mallampati III , mucosa clear , drainage- none, tonsils- atrophic, +  dentures Neck- flexible , trachea midline, no stridor , thyroid nl, carotid no bruit Chest - symmetrical excursion , unlabored           Heart/CV- RRR , no murmur , no gallop  , no rub, nl s1 s2                           - JVD- none , edema- none, stasis changes- none, varices- none           Lung- clear to P&A, wheeze- none, cough+rattling , dullness-none, rub- none           Chest wall-  Abd- soft Br/ Gen/ Rectal- Not done, not indicated Extrem- cyanosis- none, clubbing, none, atrophy- none, strength- nl. +cane Neuro- + somewhat slow but oriented.

## 2013-10-31 NOTE — Telephone Encounter (Signed)
pls let me know what pt needs for bed , I prefer if HH provide the infpo in writing if possible so that the confusion is clarified, thanks

## 2013-10-31 NOTE — Addendum Note (Signed)
Addended by: Tula Nakayama E on: 10/31/2013 11:03 AM   Modules accepted: Orders

## 2013-10-31 NOTE — Telephone Encounter (Signed)
Referral is entered 

## 2013-11-03 ENCOUNTER — Telehealth: Payer: Self-pay | Admitting: Internal Medicine

## 2013-11-03 ENCOUNTER — Other Ambulatory Visit: Payer: Self-pay | Admitting: Family Medicine

## 2013-11-03 NOTE — Telephone Encounter (Signed)
Result Notes    Notes Recorded by Deneise Lever, MD on 10/31/2013 at 1:57 PM CXR- some chronic changes, nothing new to explain recent wheeze.   Pt aware of results and no questions or concerns at this time.

## 2013-11-04 ENCOUNTER — Telehealth: Payer: Self-pay | Admitting: Family Medicine

## 2013-11-04 ENCOUNTER — Other Ambulatory Visit: Payer: Self-pay

## 2013-11-04 NOTE — Telephone Encounter (Signed)
Error

## 2013-11-05 MED ORDER — DICYCLOMINE HCL 10 MG PO CAPS: 10.0000 mg | ORAL_CAPSULE | Freq: Three times a day (TID) | ORAL | Status: DC

## 2013-11-06 LAB — HEMOGLOBIN A1C: A1c: 8.5

## 2013-11-07 ENCOUNTER — Telehealth: Payer: Self-pay | Admitting: Family Medicine

## 2013-11-07 NOTE — Telephone Encounter (Signed)
Pls call pt, let her know that Dr Dorris Fetch got her BP reading low (89/52???) at his office and advised her to hold metoprolol. I suggest a nurse visit for bP check early next week , which i will re check, she may also get flu vaccine then

## 2013-11-10 ENCOUNTER — Other Ambulatory Visit: Payer: Self-pay

## 2013-11-11 NOTE — Telephone Encounter (Signed)
Patient will come in either Thursday or early next week for BP check. Doesn't know which day. Told to call before she comes

## 2013-11-13 ENCOUNTER — Telehealth: Payer: Self-pay | Admitting: Family Medicine

## 2013-11-13 NOTE — Telephone Encounter (Signed)
Pls contact foot center and send letter I have writtent for you to type and fax. In summary, I am NOT the Doc to sign off on diabetic shoes for the pt since I am NOT the Doc who is treating her diabetes, need to request Dr Dorris Fetch to do this , he is treating her diabetes  It Will NOT be approved with my signature , it NEEDS to be tereating Doc  In the letter I have stated that my exam showed only decreased sensation and that ist is oK for you to send a copy of the oV when done 08/14/2013  I will discuss with you

## 2013-11-14 NOTE — Telephone Encounter (Signed)
Letter composed and sent

## 2013-11-17 ENCOUNTER — Telehealth: Payer: Self-pay | Admitting: Family Medicine

## 2013-11-18 ENCOUNTER — Telehealth: Payer: Self-pay

## 2013-11-18 ENCOUNTER — Encounter: Payer: Self-pay | Admitting: Internal Medicine

## 2013-11-18 ENCOUNTER — Ambulatory Visit (INDEPENDENT_AMBULATORY_CARE_PROVIDER_SITE_OTHER): Payer: Commercial Managed Care - HMO | Admitting: Internal Medicine

## 2013-11-18 VITALS — BP 139/78 | HR 87 | Temp 97.4°F | Ht 62.0 in | Wt 171.0 lb

## 2013-11-18 DIAGNOSIS — Z86010 Personal history of colonic polyps: Secondary | ICD-10-CM

## 2013-11-18 DIAGNOSIS — K5730 Diverticulosis of large intestine without perforation or abscess without bleeding: Secondary | ICD-10-CM

## 2013-11-18 DIAGNOSIS — K589 Irritable bowel syndrome without diarrhea: Secondary | ICD-10-CM

## 2013-11-18 DIAGNOSIS — K219 Gastro-esophageal reflux disease without esophagitis: Secondary | ICD-10-CM

## 2013-11-18 NOTE — Progress Notes (Signed)
Primary Care Physician:  Tula Nakayama, MD Primary Gastroenterologist:  Dr. Gala Romney  Pre-Procedure History & Physical: HPI:  [name] is a 63 y.o. female here for alternating constipation and diarrhea. GERD. History of colonic adenoma. Patient tells me she has "good and bad days". However, most of her days in a given month are described as a stool every day to every other day-taking stool softener daily, Bentyl 10 mg 4 times a day. She felt Linzess and Amitiza were too much and this produced diarrhea. Even with withholding those agents, a couple times a month, she may have episodes nonbloody diarrhea and rare episodes of incontinence. She has gained 7 pounds since her last office visit here.  Past Medical History  Diagnosis Date  . Bipolar disorder   . CVA (cerebral infarction)   . Pancreatitis 2006    due to Depakote with normal EUS   . Osteoporosis   . Chronic back pain   . Diabetes mellitus   . Trigger finger   . Anxiety disorder   . Hypertension   . Migraines     chronic headaches  . Diverticulosis     TCS 9/08 by Dr. Delfin Edis for diarrhea . Bx for micro scopic colitis negative.   . Schatzki's ring     non critical / EGD with ED 8/2011with RMR  . S/P colonoscopy 2951,8841, 2011    left-sided diverticula, hx of simple adenomas . 2011, random bx negative for microscopic colitis  . Glaucoma   . Allergic rhinitis   . Hypothyroidism     thyroid goiter  . Anemia   . Blood transfusion   . GERD (gastroesophageal reflux disease)   . Stroke     left sided weakness  . Seizures     unknown etiology-on meds-last seizure was 3 yerars ago  . Anxiety   . Depression   . Metabolic encephalopathy 6/60/6301  . Sleep apnea     on CPAP  . Arthritis   . Gum symptoms     infection on antibiotic  . Chronic neck pain   . Mononeuritis lower limb   . Frequent falls     Past Surgical History  Procedure Laterality Date  . Abdominal hysterectomy  1978  . Cholecystectomy  1984    . Ovarian cyst removal    . Carpal tunnel release Left 07/22/04    Dr. Aline Brochure  . Breast reduction surgery  1994  . Cataract extraction Bilateral   . Biopsy thyroid  2009  . Surgical excision of 3 tumors from right thigh and right buttock  and left upper thigh  2010  . Back surgery  July 2012  . Spine surgery  09/29/2010    Dr. Rolena Infante  . Maloney dilation  12/29/2010    RMR;  . Esophagogastroduodenoscopy  12/29/2010    Serrina Minogue-Retained food in the esophagus and stomach, small hiatal hernia, status post Maloney dilation of the esophagus  . Craniotomy  11/23/2011    Procedure: CRANIOTOMY TUMOR EXCISION;  Surgeon: Hosie Spangle, MD;  Location: [facility];  Service: Neurosurgery;  Laterality: N/A;  Craniotomy for tumor resection  . Brain surgery  11/2011    resection of meningioma  . Colonoscopy N/A 09/25/2012    SWF:UXNATFT diverticulosis.  colonic polyp-removed : tubular adenoma  . Esophagogastroduodenoscopy N/A 09/25/2012    DDU:KGURKYHC atonic baggy esophagus status post Maloney dilation 64 F. Hiatal hernia  . Givens capsule study N/A 01/15/2013    NORMAL.   . Bacterial  overgrowth test N/A 05/05/2013    Procedure: BACTERIAL OVERGROWTH TEST;  Surgeon: Daneil Dolin, MD;  Location: [facility];  Service: Endoscopy;  Laterality: N/A;  7:30  . Transthoracic echocardiogram  2010    EF 60-65%, mild conc LVH, grade 1 diastolic dysfunction; mildly calcified MV annulus with mildly thickened leaflets, mildly calcified MR annulus  . Nm myocar perf wall motion  2006    "relavtiely normal" persantine, mild anterior thinning (breast attenuation artifact), no region of scar/ischemia  . Cardiac catheterization  05/10/2005    normal coronaries, normal LV systolic function and EF (Dr. Jackie Plum)    Prior to Admission medications   Medication Sig Start Date End Date Taking? Authorizing Provider  aspirin EC 81 MG tablet Take 81 mg by mouth daily.   Yes Historical Provider, MD  Cholecalciferol (D  5000) 5000 UNITS capsule Take 5,000 Units by mouth daily.   Yes Historical Provider, MD  cloNIDine (CATAPRES) 0.3 MG tablet TAKE 1 TABLET BY MOUTH EVERY EIGHT HOURS. ONCE AT 8AM, 4PM, AND 12 MIDNIGHT.   Yes [name], MD  CRESTOR 5 MG tablet TAKE 1 TABLET BY MOUTH AT BEDTIME.   Yes [name], MD  diclofenac sodium (VOLTAREN) 1 % GEL Apply 2 g topically daily as needed (Pain).   Yes Historical Provider, MD  dicyclomine (BENTYL) 10 MG capsule Take 1 capsule (10 mg total) by mouth 4 (four) times daily -  before meals and at bedtime. As needed for cramps and diarrhea 11/05/13  Yes Mahala Menghini, PA-C  hydrochlorothiazide (HYDRODIURIL) 25 MG tablet Take 1 tablet (25 mg total) by mouth daily. 08/14/13  Yes [name], MD  HYDROmorphone (DILAUDID) 2 MG tablet Take 1 tablet by mouth 3 (three) times daily. 10/24/13  Yes Historical Provider, MD  hydroxychloroquine (PLAQUENIL) 200 MG tablet Take 200 mg by mouth daily.   Yes Historical Provider, MD  insulin glargine (LANTUS) 100 UNIT/ML injection Inject 50 Units into the skin daily.    Yes Historical Provider, MD  lamoTRIgine (LAMICTAL) 100 MG tablet Take 150 mg by mouth every morning.  06/18/12  Yes Historical Provider, MD  levothyroxine (LEVOXYL) 50 MCG tablet Take 50 mcg by mouth every morning.    Yes Historical Provider, MD  linagliptin (TRADJENTA) 5 MG TABS tablet Take 5 mg by mouth daily.   Yes Historical Provider, MD  LORazepam (ATIVAN) 0.5 MG tablet Take 0.5 mg by mouth 2 (two) times daily.    Yes Historical Provider, MD  losartan (COZAAR) 100 MG tablet Take 1 tablet (100 mg total) by mouth daily. 02/17/13 02/17/14 Yes [name], MD  Magnesium Oxide 250 MG TABS Take 1 tablet (250 mg total) by mouth daily. 09/16/13  Yes [name], MD  methocarbamol (ROBAXIN) 500 MG tablet Take 500 mg by mouth daily.   Yes Historical Provider, MD  metoprolol (LOPRESSOR) 50 MG tablet Take 1 tablet (50 mg total) by mouth 2 (two) times  daily. 06/18/13  Yes Pixie Casino, MD  mirtazapine (REMERON) 30 MG tablet Take 30 mg by mouth at bedtime.    Yes Historical Provider, MD  montelukast (SINGULAIR) 10 MG tablet TAKE ONE TABLET BY MOUTH ONCE DAILY. 11/03/13  Yes [name], MD  Multiple Vitamins-Minerals (ONE-A-DAY 50 PLUS PO) Take 1 tablet by mouth daily.   Yes Historical Provider, MD  NOVOLOG FLEXPEN 100 UNIT/ML FlexPen Inject 8 Units into the skin 4 (four) times daily.  11/13/13  Yes Historical Provider, MD  oxyCODONE-acetaminophen (PERCOCET) 10-325 MG per tablet Take 1 tablet by mouth 3 (three) times daily as needed for pain.  12/10/12  Yes Historical Provider, MD  pregabalin (LYRICA) 75 MG capsule Take 75 mg by mouth 2 (two) times daily.  10/26/11  Yes Carole Civil, MD  Probiotic Product (PROBIOTIC FORMULA PO) Take 1 capsule by mouth daily.   Yes Historical Provider, MD  RABEprazole (ACIPHEX) 20 MG tablet TAKE 1 TABLET BY MOUTH TWICE DAILY. 07/28/13  Yes Orvil Feil, NP  sertraline (ZOLOFT) 100 MG tablet Take 100 mg by mouth at bedtime.    Yes Historical Provider, MD  spironolactone (ALDACTONE) 25 MG tablet Take 1 tablet (25 mg total) by mouth daily. 09/16/13  Yes [name], MD  terbinafine (LAMISIL) 250 MG tablet Take 1 tablet by mouth daily. 08/15/13  Yes Historical Provider, MD  thioridazine (MELLARIL) 10 MG tablet Take 20 mg by mouth at bedtime.  08/16/12  Yes Historical Provider, MD  traZODone (DESYREL) 50 MG tablet Take 50 mg by mouth at bedtime.  05/14/12  Yes Historical Provider, MD  mometasone-formoterol (DULERA) 100-5 MCG/ACT AERO Inhale 2 puffs into the lungs 2 (two) times daily. 10/30/13   Deneise Lever, MD  promethazine (PHENERGAN) 12.5 MG tablet Take 1 tablet (12.5 mg total) by mouth every 6 (six) hours as needed. FOR NAUSEA AND VOMITING 07/10/13   Orvil Feil, NP    Allergies as of 11/18/2013 - Review Complete 11/18/2013  Allergen Reaction Noted  . Cephalexin Hives   . Iron Nausea And Vomiting  11/15/2012  . Milk-related compounds Other (See Comments) 07/29/2008  . Penicillins Hives   . Phenazopyridine hcl Other (See Comments) 06/19/2007    Family History  Problem Relation Age of Onset  . Heart attack Mother     HTN  . Pneumonia Father   . Kidney failure Father   . Pancreatic cancer Sister   . Diabetes Brother   . Hypertension Brother   . Colon cancer Neg Hx   . Anesthesia problems Neg Hx   . Hypotension Neg Hx   . Malignant hyperthermia Neg Hx   . Pseudochol deficiency Neg Hx   . Stroke Maternal Grandmother   . Heart attack Maternal Grandfather   . Cancer Sister   . Hypertension Son     History   Social History  . Marital Status: Divorced    Spouse Name: N/A    Number of Children: 1  . Years of Education: 12   Occupational History  . disabled     Social History Main Topics  . Smoking status: Former Smoker -- 0.25 packs/day for 7 years    Types: Cigarettes    Quit date: 07/11/2012  . Smokeless tobacco: Never Used     Comment: "started back but off now for 3 months" (08/18/13)  . Alcohol Use: No     Comment: 5 cigs/day   . Drug Use: No  . Sexual Activity: No   Other Topics Concern  . Not on file   Social History Narrative  . No narrative on file    Review of Systems: See HPI, otherwise negative ROS  Physical Exam: BP 139/78  Pulse 87  Temp(Src) 97.4 F (36.3 C) (Oral)  Ht 5\' 2"  (1.575 m)  Wt 171 lb (77.565 kg)  BMI 31.27 kg/m2 General:   Alert,  Well-developed, well-nourished, pleasant and cooperative in NAD Skin:  Intact without significant lesions or rashes. Eyes:  Sclera clear, no icterus.   Conjunctiva  pink. Ears:  Normal auditory acuity. Nose:  No deformity, discharge,  or lesions. Mouth:  No deformity or lesions. Neck:  Supple; no masses or thyromegaly. No significant cervical adenopathy. Lungs:  Clear throughout to auscultation.   No wheezes, crackles, or rhonchi. No acute distress. Heart:  Regular rate and rhythm; no murmurs,  clicks, rubs,  or gallops. Abdomen: Obese. normal bowel sounds.  Soft and nontender without appreciable mass or hepatosplenomegaly.  Pulses:  Normal pulses noted. Extremities:  Without clubbing or edema.  Impression: GERD-well controlled on AcipHex. Alternating constipation and diarrhea secondary to IBS. She has a tendency towards constipation these days taking Benadryl 4 times a day as a scheduled agent. It is probably too much. I'm okay with her taking a stool softener. Fiber intake may be inadequate. History of colonic diverticulosis. History of colonic adenoma.   Recommendations:   Literature on IBS-D. provided to the patient today  Use Benefiber 2 teaspoons twice daily as your fiber supplement  May continue to use a stool softener daily  Agree with stopping Amitiza and Linzess  Keep a stool diary  Decrease Bentyl to 10 mg twice daily on most days. May increase to 4 times a day as needed when you have bouts of diarrhea  Surveillance colonoscopy in 4 years from now  Office visit with [name] in 3 months    Notice: This dictation was prepared with Dragon dictation along with smaller phrase technology. Any transcriptional errors that result from this process are unintentional and may not be corrected upon review.

## 2013-11-18 NOTE — Patient Instructions (Signed)
Literature on IBS-D. provided to the patient today  Use Benefiber 2 teaspoons twice daily as your fiber supplement  May continue to use a stool softener daily  Agree with stopping Amitiza and Linzess  Keep a stool diary  Decrease Bentyl to 10 mg twice daily on most days. May increase to 4 times a day as needed when you have bouts of diarrhea  Office visit with [name] in 3 months

## 2013-11-18 NOTE — Telephone Encounter (Signed)
Just a Nurse visit. Dr Moshe Cipro wanted her to come in for this early this week

## 2013-11-18 NOTE — Telephone Encounter (Signed)
Pt said she forgot to ask Dr. Gala Romney for a refill on her phenergan this AM.

## 2013-11-19 MED ORDER — PROMETHAZINE HCL 12.5 MG PO TABS: 12.5000 mg | ORAL_TABLET | Freq: Four times a day (QID) | ORAL | Status: DC | PRN

## 2013-11-19 NOTE — Telephone Encounter (Signed)
Addressed.

## 2013-11-20 ENCOUNTER — Ambulatory Visit (INDEPENDENT_AMBULATORY_CARE_PROVIDER_SITE_OTHER): Payer: Commercial Managed Care - HMO

## 2013-11-20 VITALS — BP 134/62 | Wt 175.0 lb

## 2013-11-20 DIAGNOSIS — Z23 Encounter for immunization: Secondary | ICD-10-CM

## 2013-11-20 NOTE — Progress Notes (Signed)
Patient was told to hold her metoprolol by her endo. Came in for BP check today and BP was 134/62 which was good for her. Dr Moshe Cipro recommended no change in meds. Patient understands

## 2013-11-24 IMAGING — MR MR HEAD WO/W CM
6 of 7 series · 31 of 48 positions shown · IV contrast (12ml Multihance)
Comparison: MRI 10/18/2011

CLINICAL DATA: Meningioma resection November 2011

MRI HEAD WITHOUT AND WITH CONTRAST
TECHNIQUE: Multiplanar, multiecho pulse sequences of the brain and
surrounding structures were obtained according to standard protocol
without and with intravenous contrast
Contrast: 12mL MULTIHANCE GADOBENATE DIMEGLUMINE 529 MG/ML IV SOLN

[Series 3: DWI · axial · 5.0mm · 0.72mm/px · z∈[-22,+114]mm · 3 of 22 slices shown]
[im 1/22]
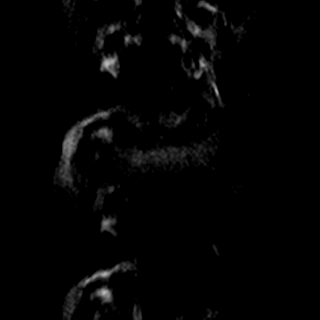
[im 11/22]
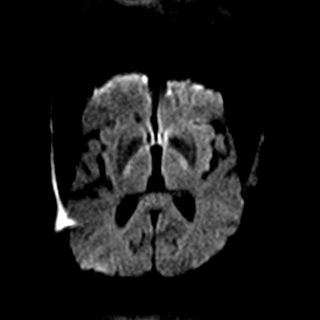
[im 22/22]
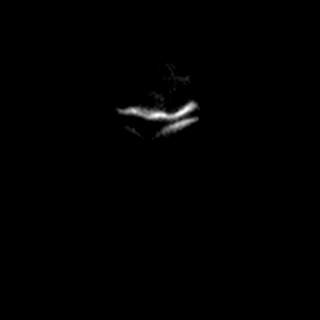

[Series 5: T2 · axial · 5.0mm · 0.57mm/px · z∈[-23,+115]mm · 4 of 24 slices shown]
[im 1/24]
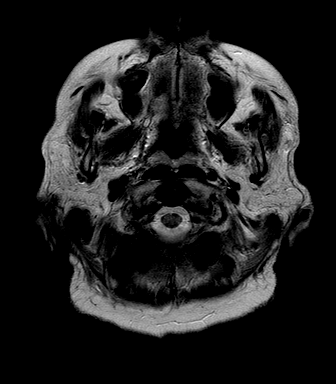
[im 8/24]
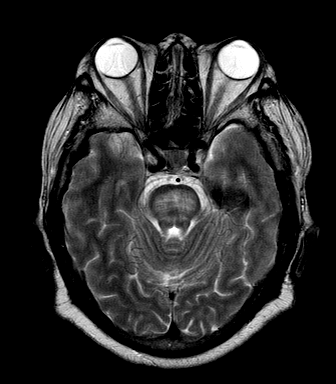
[im 16/24]
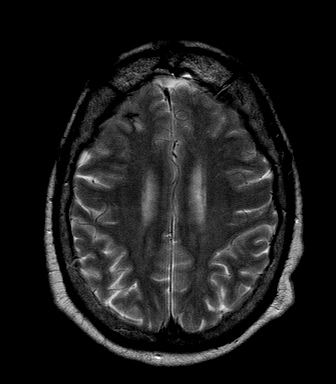
[im 24/24]
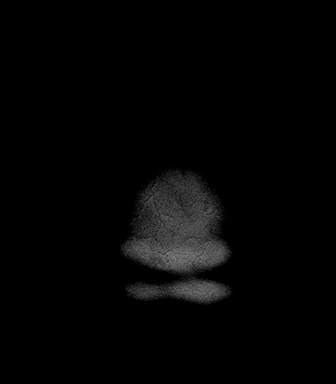

[Series 6: T1 · axial · 2.0mm · 0.55mm/px · z∈[-33,+125]mm · 8 of 80 slices shown]
[im 1/80]
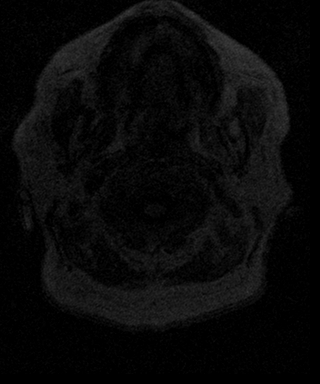
[im 13/80]
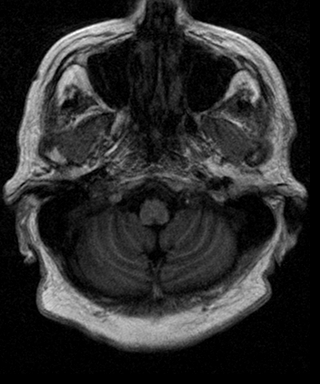
[im 25/80]
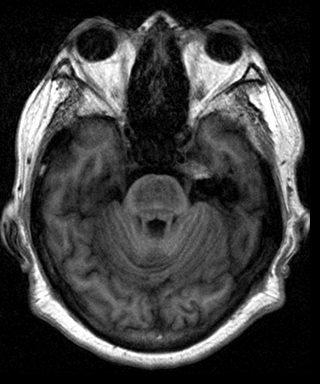
[im 37/80]
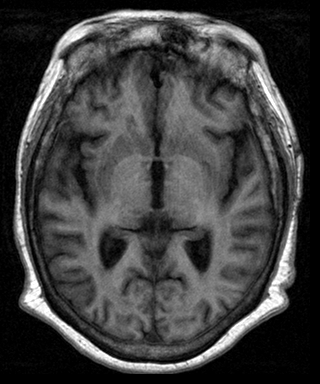
[im 43/80]
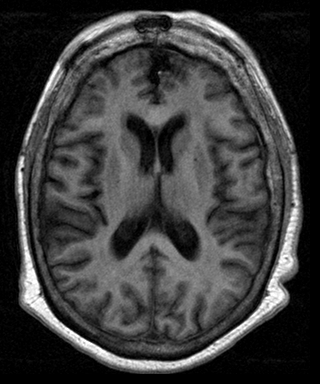
[im 55/80]
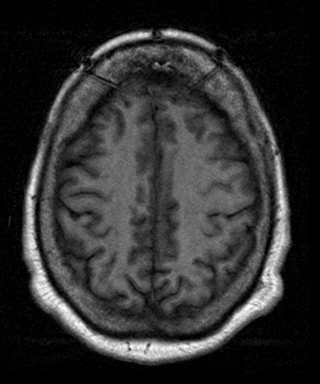
[im 67/80]
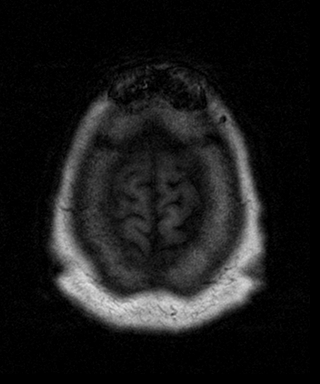
[im 80/80]
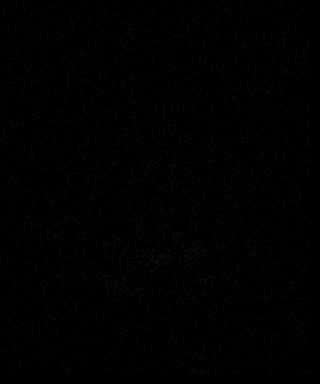

[Series 10: T1 post-contrast · axial · 2.0mm · 0.55mm/px · z∈[-33,+125]mm · 8 of 80 slices shown (1 of 3)]
[im 1/80]
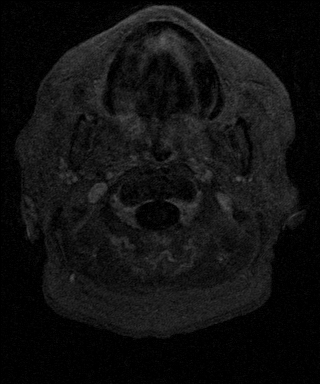
[im 13/80]
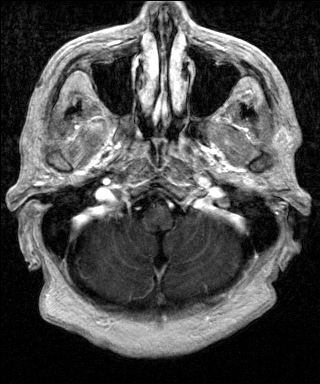
[im 25/80]
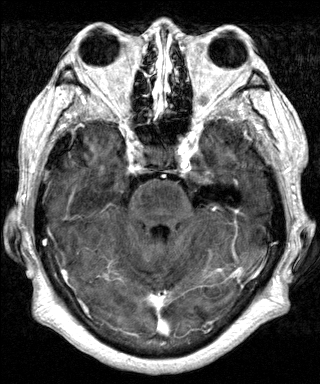
[im 37/80]
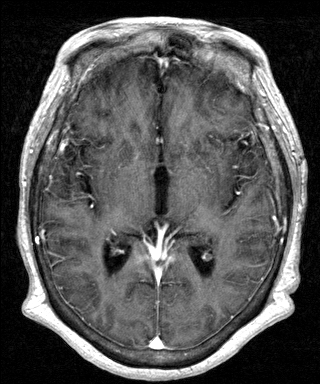
[im 43/80]
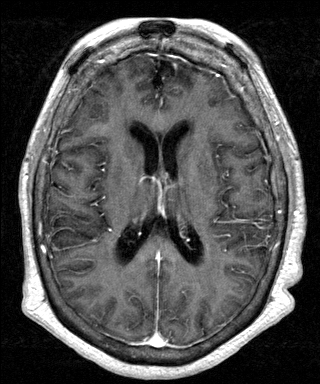
[im 55/80]
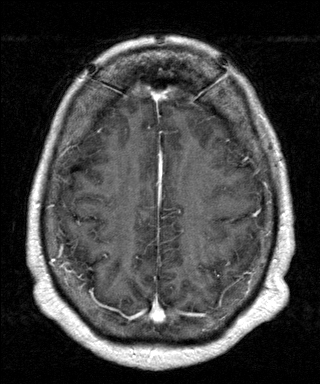
[im 67/80]
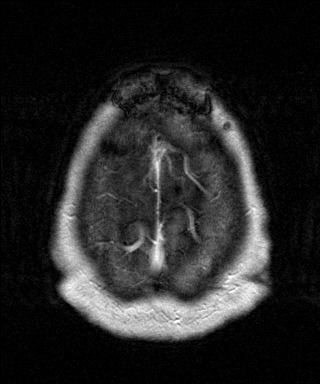
[im 80/80]
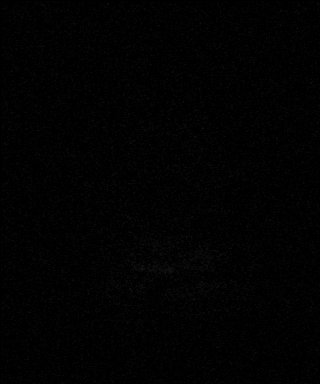

[Series 11: T1 post-contrast · coronal · 5.0mm · 0.43mm/px · 5 of 28 slices shown (2 of 3)]
[im 1/28]
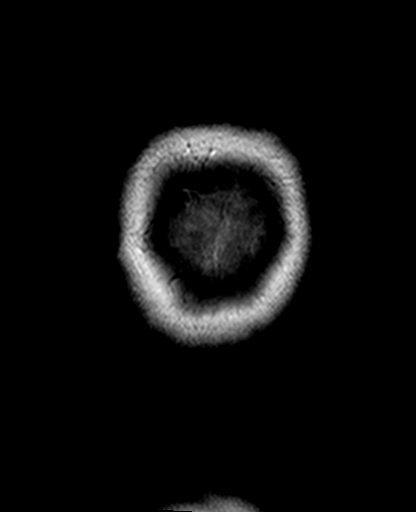
[im 7/28]
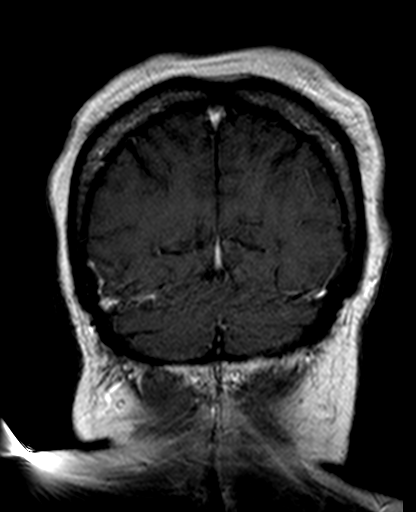
[im 14/28]
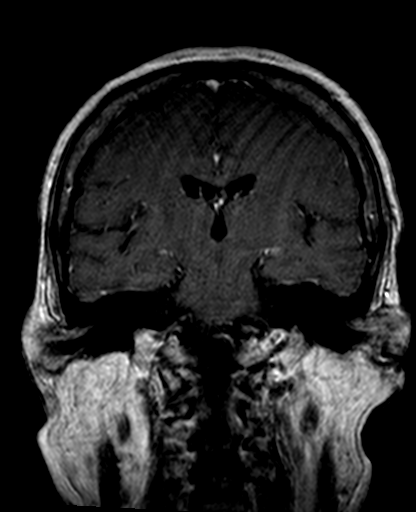
[im 21/28]
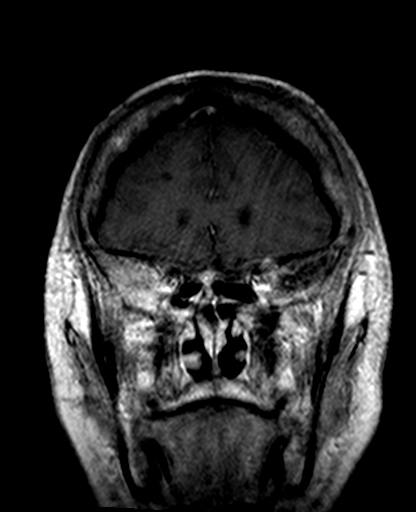
[im 28/28]
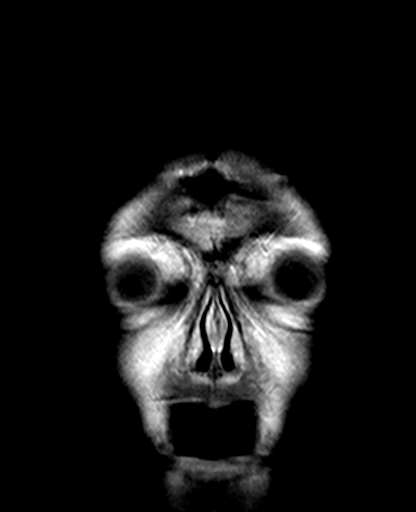

[Series 12: T1 post-contrast · sagittal · 5.0mm · 0.38mm/px · 3 of 17 slices shown (3 of 3)]
[im 1/17]
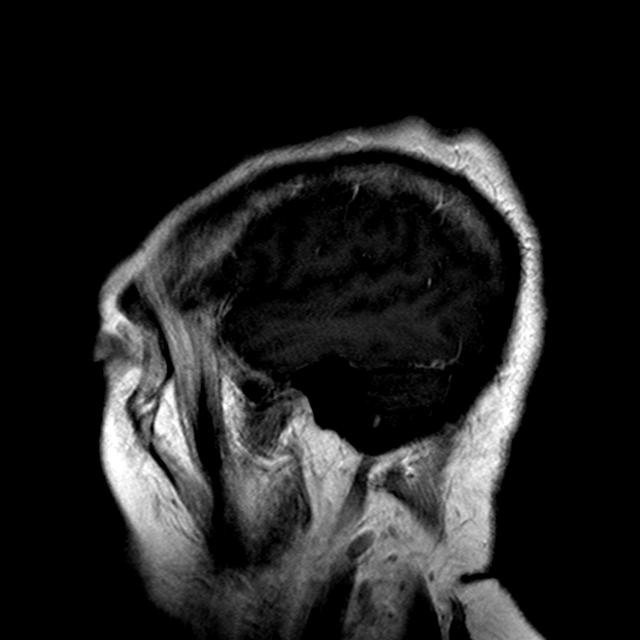
[im 9/17]
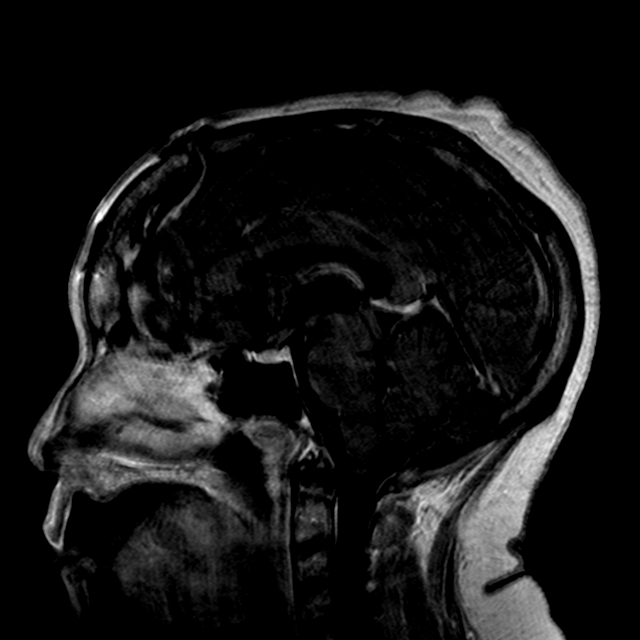
[im 17/17]
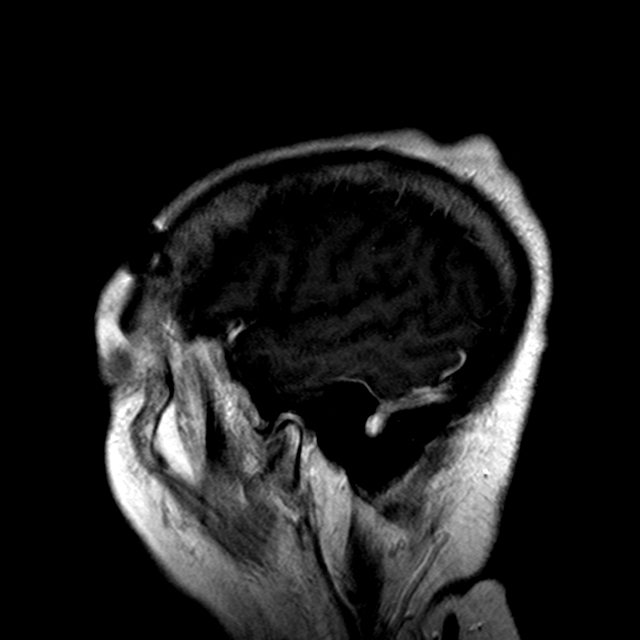

[31 of 48 positions shown; findings below may reference images not displayed]

FINDINGS: Bifrontal craniotomy for resection of meningioma along
the planum sphenoidale.  There is postop hemorrhage in the left
anterior frontal lobe. Postop hemorrhage in the right mid frontal
lobe.  There is encephalomalacia of the gyrus rectus bilaterally.
Small area of enhancement along the planum sphenoidale extending
bilaterally may represent postop dural enhancement or residual
meningioma.  This measures 5 x 8 mm.

Ventricle size is normal.  No shift of the midline structures.
Advanced hyperintensity in the pons bilaterally is stable and most
likely related to chronic ischemia.  Negative for acute infarct.

Postcontrast imaging reveals no other enhancing lesions.  The
postcontrast images are degraded by motion.
IMPRESSION: Postop resection of a meningioma  of the planum sphenoidale. Small
amount of residual enhancing tissue along the planum sphenoidale
may be postop dural enhancement or residual tumor. This measures
approximately 5 x 8 mm.  Continued follow-up MRI is suggested.

## 2013-12-05 NOTE — Telephone Encounter (Signed)
I am assuming that the nurse took care of this

## 2013-12-08 ENCOUNTER — Ambulatory Visit (INDEPENDENT_AMBULATORY_CARE_PROVIDER_SITE_OTHER): Payer: Commercial Managed Care - HMO | Admitting: Neurology

## 2013-12-08 ENCOUNTER — Encounter: Payer: Self-pay | Admitting: Neurology

## 2013-12-08 VITALS — BP 90/52 | HR 76 | Resp 20 | Wt 171.7 lb

## 2013-12-08 DIAGNOSIS — G40209 Localization-related (focal) (partial) symptomatic epilepsy and epileptic syndromes with complex partial seizures, not intractable, without status epilepticus: Secondary | ICD-10-CM

## 2013-12-08 DIAGNOSIS — G43009 Migraine without aura, not intractable, without status migrainosus: Secondary | ICD-10-CM

## 2013-12-08 DIAGNOSIS — Z8673 Personal history of transient ischemic attack (TIA), and cerebral infarction without residual deficits: Secondary | ICD-10-CM

## 2013-12-08 DIAGNOSIS — Z9889 Other specified postprocedural states: Secondary | ICD-10-CM

## 2013-12-08 DIAGNOSIS — I959 Hypotension, unspecified: Secondary | ICD-10-CM

## 2013-12-08 DIAGNOSIS — Z86018 Personal history of other benign neoplasm: Secondary | ICD-10-CM

## 2013-12-08 NOTE — Patient Instructions (Addendum)
1.  Continue lamictal 2.  Limit use of pain medications 3.  Contact [name] regarding low blood pressure 4.  Routine EEG 5.  Check lamictal level 6.  Follow up in 3 months.

## 2013-12-08 NOTE — Progress Notes (Signed)
NEUROLOGY FOLLOW UP OFFICE NOTE  [name] [id number]  HISTORY OF PRESENT ILLNESS: [name] is a 63 year old right-handed woman with history of hypertension, stroke, type II diabetes, stroke with residual left-sided weakness, seizure disorder, mononeuritis of leg, OSA, thyroid nodule, cervical disc disease, depression, anxiety, chronic pain, chronic back pain with gait instability and falls, polypharmacy, meningioma resection in September 2013 who follows up for chronic headaches, seizures, and meningioma.  Records and images were personally reviewed.  UPDATE: She has been feeling dizzy and lightheaded.  She says that she took her blood pressure a week or two ago and it was 80/50.  She saw her PCP and her blood pressure was okay.  Blood pressure today in 90/50.    HEADACHES: She has longstanding history of multifactorial chronic headaches.  They are described as from the back of her head and top of the front of her head.  It is a pounding pain, 8/10.  There is some nausea, but no photophobia or phonophobia.  She has blurred vision, but that is constant.  They usually last all day and occur 2 to 3 times a week.  She usually takes Tylenol for it, but she also is taking oxycodone for left arm pain.  Prior medications included lamotrigine, gabapentin, Lyrica, metoprolol, amitriptyline, and imipramine.  She is not a candidate for triptans due to history of stroke.  Update:  given the complexity of the headaches, such as medication-overuse, and use of opioids daiIy recommended management by a headache specialist.  She saw Dr. Domingo Cocking at the [facility] on 04/07/13.  She received round of trigger point injections.  Dr. Domingo Cocking recommended Botox but she still does not want to pursue this.  She has had reduced frequency of headaches, however, to 1-2 times a month.  SEIZURES: She has history of seizures for many years.  Her last seizure was about 3 years ago.  She is taking Lamictal 100mg   daily.  Her last EEG from 08/08/12 revealed right temporal epileptiform discharges.  Labs from 08/06/13 revealed Na 141, K 4.1, Cl 109, CO2 24, glucose 217, BUN 18, Cr 0.88, TB 0.3, ALP 147, AST 15, ALT 14, TP 6.6, Alb 4.0, Ca 9.2, WBC 9.0, HGB 11.3, HCT 37.2, PLT 234  Update:  No seizures.  MENINGIOMA: She had a known history of meningioma.  She presented to the ED in August 2013 with sudden onset numbness of the feet and left hand weakness and numbness.  MRI of brain noted enlargement of previous planum sphenoidale meningioma compared to prior study in 2009, with vasogenic edema.  In September 2013, she underwent a bicoronal craniotomy and gross total resection.  Repeat MRI of brain with and without contrast performed on 05/01/12 revealed postop resection of meningioma of the planum sphenoidale with small amount of residual enhancing tissue along the planum sphenoidale (5 x 78mm), postop blood in the bilateral frontal lobes and bilateral encephalomalacia of the gyrus rectus bilaterally.  Update:  She had a repeat MRI of the brain with and without contrast performed on 06/18/13, which revealed no recurrent tumor.  STROKE: She has a history of stroke going back several years ago.  Semiology is unclear but reports left-sided issues.  She was started on Plavix, but last visit, she was switched over to ASA 81mg .  LDL from 08/06/13 98.  Hgb A1c from 10/28/13 was 8.5.   MEMORY: She also reports problems with memory, word-finding difficulties, and problems with name recognition of acquaintances of people  at church.  This has been going on for over a year.  MMSE score from 06/06/13 was 28/30 with minor inconsistencies (did not cross the intersecting pentagons and missed the date but not month, day of week, season or year).  Update:  More recently, she reports that at times, when she speaks, she doesn't make sense.  Her words or intelligible but content is confusing.  It may be accompanied by slurred speech.  Both  herself and others notice it.  It has been going on for a while but she notices it more over the past month or two.  OTHER HISTORY: She has history of cervical disc disease.  Recent MRI of cervical spine revealed shallow central disc protrusion at C3-4, degenerative disc disease and facet disease at C5-6 with a central disc protrusion, and shallow left paracentral and medial foraminal disc protrusion at C7-T1.  Dr. Sherwood Gambler, her neurosurgeon, did not think she was a surgical candidate.  She also has history of OSA.  Currently on CPAP and is compliant.  She still has hypersomnolence.  She had a NPSG on 06/29/13, which revealed mild OSA, AHI 8.4/ hr, REM only 5.3% of TST.  MSLT performed on 06/30/13 pathological sleepiness, with mean latency 0.8%, No REM naps. The results were compromised because the night before, she had taken trazodone, mellaril, Lyrica, and mirtazapine.  She is on multiple medications for many chronic conditions as listed above, including, Norco, lorazepam, meclizine, mirtazapine, methocarbamol, Lyrica, gabapentin, Zoloft, phenergan, Lamictal, levothyroxine and naproxen.  She has been hospitalized on occasion for altered mental status.    PAST MEDICAL HISTORY: Past Medical History  Diagnosis Date  . Bipolar disorder   . CVA (cerebral infarction)   . Pancreatitis 2006    due to Depakote with normal EUS   . Osteoporosis   . Chronic back pain   . Diabetes mellitus   . Trigger finger   . Anxiety disorder   . Hypertension   . Migraines     chronic headaches  . Diverticulosis     TCS 9/08 by Dr. Delfin Edis for diarrhea . Bx for micro scopic colitis negative.   . Schatzki's ring     non critical / EGD with ED 8/2011with RMR  . S/P colonoscopy [id number], 2011    left-sided diverticula, hx of simple adenomas . 2011, random bx negative for microscopic colitis  . Glaucoma   . Allergic rhinitis   . Hypothyroidism     thyroid goiter  . Anemia   . Blood transfusion   . GERD  (gastroesophageal reflux disease)   . Stroke     left sided weakness  . Seizures     unknown etiology-on meds-last seizure was 3 yerars ago  . Anxiety   . Depression   . Metabolic encephalopathy 6/96/7893  . Sleep apnea     on CPAP  . Arthritis   . Gum symptoms     infection on antibiotic  . Chronic neck pain   . Mononeuritis lower limb   . Frequent falls     MEDICATIONS: Current Outpatient Prescriptions on File Prior to Visit  Medication Sig Dispense Refill  . aspirin EC 81 MG tablet Take 81 mg by mouth daily.      . Cholecalciferol (D 5000) 5000 UNITS capsule Take 5,000 Units by mouth daily.      . cloNIDine (CATAPRES) 0.3 MG tablet TAKE 1 TABLET BY MOUTH EVERY EIGHT HOURS. ONCE AT 8AM, 4PM, AND 12 MIDNIGHT.  90 tablet  3  .  CRESTOR 5 MG tablet TAKE 1 TABLET BY MOUTH AT BEDTIME.  30 tablet  3  . diclofenac sodium (VOLTAREN) 1 % GEL Apply 2 g topically daily as needed (Pain).      [name] dicyclomine (BENTYL) 10 MG capsule Take 1 capsule (10 mg total) by mouth 4 (four) times daily -  before meals and at bedtime. As needed for cramps and diarrhea  120 capsule  1  . hydrochlorothiazide (HYDRODIURIL) 25 MG tablet Take 1 tablet (25 mg total) by mouth daily.  30 tablet  3  . HYDROmorphone (DILAUDID) 2 MG tablet Take 1 tablet by mouth 3 (three) times daily.      . hydroxychloroquine (PLAQUENIL) 200 MG tablet Take 200 mg by mouth daily.      . insulin glargine (LANTUS) 100 UNIT/ML injection Inject 50 Units into the skin daily.       [name] lamoTRIgine (LAMICTAL) 100 MG tablet Take 150 mg by mouth every morning.       [name] levothyroxine (LEVOXYL) 50 MCG tablet Take 50 mcg by mouth every morning.       . linagliptin (TRADJENTA) 5 MG TABS tablet Take 5 mg by mouth daily.      [name] LORazepam (ATIVAN) 0.5 MG tablet Take 0.5 mg by mouth 2 (two) times daily.       [name] losartan (COZAAR) 100 MG tablet Take 1 tablet (100 mg total) by mouth daily.  30 tablet  11  . Magnesium Oxide 250 MG TABS Take 1 tablet (250 mg  total) by mouth daily.  30 tablet  5  . methocarbamol (ROBAXIN) 500 MG tablet Take 500 mg by mouth daily.      . mometasone-formoterol (DULERA) 100-5 MCG/ACT AERO Inhale 2 puffs into the lungs 2 (two) times daily.  1 Inhaler  0  . montelukast (SINGULAIR) 10 MG tablet TAKE ONE TABLET BY MOUTH ONCE DAILY.  30 tablet  3  . Multiple Vitamins-Minerals (ONE-A-DAY 50 PLUS PO) Take 1 tablet by mouth daily.      [name] NOVOLOG FLEXPEN 100 UNIT/ML FlexPen Inject 8 Units into the skin 4 (four) times daily.       . pregabalin (LYRICA) 75 MG capsule Take 75 mg by mouth 2 (two) times daily.       . Probiotic Product (PROBIOTIC FORMULA PO) Take 1 capsule by mouth daily.      . promethazine (PHENERGAN) 12.5 MG tablet Take 1 tablet (12.5 mg total) by mouth every 6 (six) hours as needed. FOR NAUSEA AND VOMITING  30 tablet  0  . RABEprazole (ACIPHEX) 20 MG tablet TAKE 1 TABLET BY MOUTH TWICE DAILY.  60 tablet  5  . sertraline (ZOLOFT) 100 MG tablet Take 100 mg by mouth at bedtime.       [name] spironolactone (ALDACTONE) 25 MG tablet Take 1 tablet (25 mg total) by mouth daily.  30 tablet  4  . terbinafine (LAMISIL) 250 MG tablet Take 1 tablet by mouth daily.      [name] thioridazine (MELLARIL) 10 MG tablet Take 20 mg by mouth at bedtime.       . traZODone (DESYREL) 50 MG tablet Take 50 mg by mouth at bedtime.       . metoprolol (LOPRESSOR) 50 MG tablet Take 1 tablet (50 mg total) by mouth 2 (two) times daily.  60 tablet  6  . mirtazapine (REMERON) 30 MG tablet Take 30 mg by mouth at bedtime.       [name] oxyCODONE-acetaminophen (PERCOCET) 10-325 MG  per tablet Take 1 tablet by mouth 3 (three) times daily as needed for pain.        No current facility-administered medications on file prior to visit.    ALLERGIES: Allergies  Allergen Reactions  . Cephalexin Hives  . Iron Nausea And Vomiting  . Milk-Related Compounds Other (See Comments)    Doesn't agree with stomach.   . Penicillins Hives  . Phenazopyridine Hcl Other (See  Comments)    [place]      FAMILY HISTORY: Family History  Problem Relation Age of Onset  . Heart attack Mother     HTN  . Pneumonia Father   . Kidney failure Father   . Pancreatic cancer Sister   . Diabetes Brother   . Hypertension Brother   . Colon cancer Neg Hx   . Anesthesia problems Neg Hx   . Hypotension Neg Hx   . Malignant hyperthermia Neg Hx   . Pseudochol deficiency Neg Hx   . Stroke Maternal Grandmother   . Heart attack Maternal Grandfather   . Cancer Sister   . Hypertension Son     SOCIAL HISTORY: History   Social History  . Marital Status: Divorced    Spouse Name: N/A    Number of Children: 1  . Years of Education: 12   Occupational History  . disabled     Social History Main Topics  . Smoking status: Former Smoker -- 0.25 packs/day for 7 years    Types: Cigarettes    Quit date: 07/11/2012  . Smokeless tobacco: Never Used     Comment: "started back but off now for 3 months" (08/18/13)  . Alcohol Use: No     Comment:    . Drug Use: No  . Sexual Activity: No   Other Topics Concern  . Not on file   Social History Narrative  . No narrative on file    REVIEW OF SYSTEMS: Constitutional: fatigue Eyes: occasional blurred vision Ear, nose and throat: No hearing loss, ear pain, nasal congestion, sore throat Cardiovascular: No chest pain, palpitations Respiratory:  No shortness of breath at rest or with exertion, wheezes GastrointestinaI: No nausea, vomiting, diarrhea, abdominal pain, fecal incontinence Genitourinary:  No dysuria, urinary retention or frequency Musculoskeletal:  Generalized pain Integumentary: No rash, pruritus, skin lesions Neurological: as above Psychiatric: Depression Endocrine: No palpitations, fatigue, diaphoresis, mood swings, change in appetite, change in weight, increased thirst Hematologic/Lymphatic:  No anemia, purpura, petechiae. Allergic/Immunologic: no itchy/runny eyes, nasal congestion, recent allergic reactions,  rashes  PHYSICAL EXAM: Filed Vitals:   12/08/13 0941  BP: 90/52  Pulse: 76  Resp: 20   General: No acute distress Head:  Normocephalic/atraumatic Neck: supple, no paraspinal tenderness, full range of motion Heart:  Regular rate and rhythm Lungs:  Clear to auscultation bilaterally Back: No paraspinal tenderness Neurological Exam: alert and oriented to person, place, and time (although got the date wrong by one day). Attention and concentration reduced due to somnolence.  Fund of knowledge intact. MMSE - Mini Mental State Exam 12/08/2013  Orientation to time 5  Orientation to Place 5  Registration 2  Attention/ Calculation 5  Recall 2  Language- name 2 objects 2  Language- repeat 1  Language- follow 3 step command 3  Language- read & follow direction 1  Write a sentence 1  Copy design 0  Total score 27  Speech slowed and dysarthric, language intact. CN II-XII intact. Fundi not visualized. Bulk and tone normal. 5-/5 left hip flexion, otherwise 5/5.  Reduced pinprick sensation to left arm and leg. DTR 1+ throughout, except absent at ankles. Finger to nose slowed but no dysmetria. Wide-based gait.   IMPRESSION: 1.  Chronic headache, multifactorial, improved. 2.  History of complex partial seizures, controlled. 3.  History of stroke. 4.  Polypharmacy 5.  Chronic pain. 6.  History of meningioma resection. 7.  I suspect the occasional confused talking and slurred speech is related to polypharmacy and hypersomnolence, but we will check another EEG to look for any changes from prior test. 8.  Hypotension  PLAN: Continue ASA 81mg , statin therapy, glycemic control.  Follow up fasting Lipid panel and Hgb A1c (LDL goal should be less than 70). Continue Lamictal 100mg  daily.  Will check another level. Headaches treated as per Headache [place] [facility] Instructed to call Dr. Griffin Dakin office regarding low blood pressure and dizziness. Follow up in 3 months.  Metta Clines, DO  CC:   Tula Nakayama, MD

## 2013-12-09 ENCOUNTER — Telehealth: Payer: Self-pay | Admitting: Family Medicine

## 2013-12-09 NOTE — Telephone Encounter (Signed)
Recent neurology visit, pt noted to have SBP of 90, and she  C/o dizziness, pls encourage her to ensure adeuqte hydration, ensure she takes meds as directed. She has OV on 10/07, unless she continues to be dizzy or light headed, will re check her BP a that time ( was  seen less than 4 weeks ago for BP check which was good) Pls call and make her aware of plan , (t ry to call her before she calls  [name] with concern pls)

## 2013-12-10 NOTE — Telephone Encounter (Signed)
Patient aware.

## 2013-12-11 ENCOUNTER — Telehealth: Payer: Self-pay | Admitting: Family Medicine

## 2013-12-11 LAB — LAMOTRIGINE LEVEL: LAMOTRIGINE LVL: 6.7 ug/mL (ref 4.0–18.0)

## 2013-12-11 NOTE — Telephone Encounter (Signed)
Both readings 152/65 and 118/61 were ok. Told to wait until visit to see what Dr recommends

## 2013-12-15 ENCOUNTER — Telehealth: Payer: Self-pay | Admitting: Internal Medicine

## 2013-12-15 NOTE — Telephone Encounter (Signed)
Spoke with patient-states that she needs to give a 4 day notice to her transportation company. Pt has been placed on Friday 12-26-13 11:30am appt with [facility]. Nothing more needed at this time.

## 2013-12-16 ENCOUNTER — Ambulatory Visit: Payer: Commercial Managed Care - HMO | Admitting: Internal Medicine

## 2013-12-16 ENCOUNTER — Telehealth: Payer: Self-pay | Admitting: *Deleted

## 2013-12-16 NOTE — Telephone Encounter (Signed)
Patient is aware of normal labs  

## 2013-12-16 NOTE — Telephone Encounter (Signed)
Message copied by Claudie Revering on Tue Dec 16, 2013  8:11 AM ------      Message from: JAFFE, ADAM R      Created: Tue Dec 16, 2013  6:35 AM       Lamictal level is okay      ----- Message -----         From: [facility]         Sent: 12/11/2013   2:19 AM           To: Dudley Major, DO                   ------

## 2013-12-17 ENCOUNTER — Encounter: Payer: Self-pay | Admitting: Family Medicine

## 2013-12-17 ENCOUNTER — Ambulatory Visit (INDEPENDENT_AMBULATORY_CARE_PROVIDER_SITE_OTHER): Payer: Medicare HMO | Admitting: Family Medicine

## 2013-12-17 VITALS — BP 126/64 | HR 98 | Resp 18 | Ht 59.0 in | Wt 172.0 lb

## 2013-12-17 DIAGNOSIS — E038 Other specified hypothyroidism: Secondary | ICD-10-CM

## 2013-12-17 DIAGNOSIS — Z0000 Encounter for general adult medical examination without abnormal findings: Secondary | ICD-10-CM

## 2013-12-17 DIAGNOSIS — I10 Essential (primary) hypertension: Secondary | ICD-10-CM

## 2013-12-17 DIAGNOSIS — E785 Hyperlipidemia, unspecified: Secondary | ICD-10-CM

## 2013-12-17 DIAGNOSIS — IMO0001 Reserved for inherently not codable concepts without codable children: Secondary | ICD-10-CM

## 2013-12-17 DIAGNOSIS — E1165 Type 2 diabetes mellitus with hyperglycemia: Secondary | ICD-10-CM

## 2013-12-17 DIAGNOSIS — M25551 Pain in right hip: Secondary | ICD-10-CM

## 2013-12-17 DIAGNOSIS — D539 Nutritional anemia, unspecified: Secondary | ICD-10-CM

## 2013-12-17 DIAGNOSIS — Z794 Long term (current) use of insulin: Secondary | ICD-10-CM

## 2013-12-17 NOTE — Patient Instructions (Addendum)
F/u in 3 month, call if you need me before  Blood pressure is good, no change in medcation  Pelvic exam today is normal  Xray of right hip is ordered due to c/o new hip pain  You are referred to endo in [place] re thyroid and diabetes per your request, you will be scheduled if they take your insurance  CBC  And anemia panel fasting lipid , cmp and EGFR as soon as possible  There is hidden blood in your stool today, may be because you recently had diarreah, you also have diverticulosis, if you start having obvious bright red rectal bleeding you will need to go to the ED

## 2013-12-18 DIAGNOSIS — Z0000 Encounter for general adult medical examination without abnormal findings: Secondary | ICD-10-CM | POA: Insufficient documentation

## 2013-12-18 DIAGNOSIS — Z23 Encounter for immunization: Secondary | ICD-10-CM | POA: Insufficient documentation

## 2013-12-18 NOTE — Assessment & Plan Note (Signed)
Annual exam as documented. Counseling done  re healthy lifestyle involving commitment to 150 minutes exercise per week, heart healthy diet, and attaining healthy weight.The importance of adequate sleep also discussed. Regular seat belt use a is also discussed. C Immunization and cancer screening needs are specifically addressed at this visit.These are up to date

## 2013-12-18 NOTE — Assessment & Plan Note (Signed)
1 month h/o right hip pain, no inciting  Trauma, will obtain xray of the joint

## 2013-12-18 NOTE — Addendum Note (Signed)
Addended by: Tula Nakayama E on: 12/18/2013 12:50 AM   Modules accepted: Level of Service

## 2013-12-18 NOTE — Addendum Note (Signed)
Addended by: Denman George B on: 12/18/2013 12:18 PM   Modules accepted: Orders

## 2013-12-18 NOTE — Assessment & Plan Note (Signed)
Vaccine administered at visit.  

## 2013-12-18 NOTE — Progress Notes (Signed)
   Subjective:    Patient ID: [name], female    DOB: 22-Apr-1950, 63 y.o.   MRN: [id number]  HPI Patient is in for annual physical  exam.  She has a 1 month h/o unprovoked right hip pain, wants imaging study done, no referral requested to ortho at this time  Reqests a change in endo Provider , wants to go to Doc in [place] if able  Immunization is complete also her cancer screening tests   Review of Systems See HPI     Objective:   Physical Exam  BP 126/64  Pulse 98  Resp 18  Ht 4\' 11"  (1.499 m)  Wt 172 lb (78.019 kg)  BMI 34.72 kg/m2  SpO2 99% Pleasant well nourished female, alert and oriented x 3, in no cardio-pulmonary distress. Afebrile. HEENT No facial trauma or asymetry. Sinuses non tender.  EOMI, PERTL,  External ears normal, tympanic membranes upper and lower plates. Neck: decreased ROM, no adenopathy,JVD or thyromegaly.No bruits.  Chest: Clear to ascultation bilaterally.No crackles or wheezes. Non tender to palpation  Breast: No asymetry,no masses or lumps. No tenderness. No nipple discharge or inversion. No axillary or supraclavicular adenopathy  Cardiovascular system; Heart sounds normal,  S1 and  S2 ,no S3.  No murmur, or thrill. Apical beat not displaced Peripheral pulses normal.  Abdomen: Soft, non tender, no organomegaly or masses. No bruits. Bowel sounds normal. No guarding, tenderness or rebound.  Rectal:  Normal sphincter tone. No mass.No rectal masses.  Guaiac negative stool.  GU: External genitalia normal female genitalia , female distribution of hair. No lesions. Urethral meatus normal in size, no  Prolapse, no lesions visibly  Present. Bladder non tender. Vagina pink and moist , with no visible lesions , discharge present . Adequate pelvic support no  cystocele or rectocele noted  Uterus absent , no adnexal masses, no  adnexal tenderness.   Musculoskeletal exam: Decreased  ROM of spine, hips , shoulders and knees. No  deformity ,swelling or crepitus noted. No muscle wasting or atrophy.   Neurologic: Cranial nerves 2 to 12 intact. Power, tone ,sensation and reflexes normal throughout. No disturbance in gait. No tremor.  Skin: Intact, no ulceration, erythema , scaling or rash noted. Pigmentation normal throughout  Psych; Normal mood and affect. Judgement and concentration normal       Assessment & Plan:  Encounter for annual physical exam Annual exam as documented. Counseling done  re healthy lifestyle involving commitment to 150 minutes exercise per week, heart healthy diet, and attaining healthy weight.The importance of adequate sleep also discussed. Regular seat belt use a is also discussed. C Immunization and cancer screening needs are specifically addressed at this visit.These are up to date   Right hip pain 1 month h/o right hip pain, no inciting  Trauma, will obtain xray of the joint

## 2013-12-18 NOTE — Telephone Encounter (Signed)
Patient is receiving bed through another source.

## 2013-12-19 ENCOUNTER — Ambulatory Visit (HOSPITAL_COMMUNITY)
Admission: RE | Admit: 2013-12-19 | Discharge: 2013-12-19 | Disposition: A | Discharge status: Home / Self Care | Payer: Medicare HMO | Source: Ambulatory Visit | Attending: Family Medicine | Admitting: Family Medicine

## 2013-12-19 DIAGNOSIS — M25551 Pain in right hip: Secondary | ICD-10-CM | POA: Diagnosis present

## 2013-12-20 LAB — CBC WITH DIFFERENTIAL/PLATELET
Basophils Absolute: 0 10*3/uL (ref 0.0–0.1)
Basophils Relative: 0 % (ref 0–1)
Eosinophils Absolute: 0.1 10*3/uL (ref 0.0–0.7)
Eosinophils Relative: 2 % (ref 0–5)
HCT: 36.1 % (ref 36.0–46.0)
HEMOGLOBIN: 11 g/dL — AB (ref 12.0–15.0)
LYMPHS PCT: 34 % (ref 12–46)
Lymphs Abs: 2.4 10*3/uL (ref 0.7–4.0)
MCH: 22.2 pg — ABNORMAL LOW (ref 26.0–34.0)
MCHC: 30.5 g/dL (ref 30.0–36.0)
MCV: 72.9 fL — ABNORMAL LOW (ref 78.0–100.0)
MONOS PCT: 7 % (ref 3–12)
Monocytes Absolute: 0.5 10*3/uL (ref 0.1–1.0)
NEUTROS ABS: 4.1 10*3/uL (ref 1.7–7.7)
Neutrophils Relative %: 57 % (ref 43–77)
Platelets: 237 10*3/uL (ref 150–400)
RBC: 4.95 MIL/uL (ref 3.87–5.11)
RDW: 15.4 % (ref 11.5–15.5)
WBC: 7.2 10*3/uL (ref 4.0–10.5)

## 2013-12-20 LAB — COMPLETE METABOLIC PANEL WITH GFR
ALT: 18 U/L (ref 0–35)
AST: 18 U/L (ref 0–37)
Albumin: 4.3 g/dL (ref 3.5–5.2)
Alkaline Phosphatase: 117 U/L (ref 39–117)
BILIRUBIN TOTAL: 0.4 mg/dL (ref 0.2–1.2)
BUN: 23 mg/dL (ref 6–23)
CO2: 26 mEq/L (ref 19–32)
CREATININE: 1.04 mg/dL (ref 0.50–1.10)
Calcium: 9.6 mg/dL (ref 8.4–10.5)
Chloride: 106 mEq/L (ref 96–112)
GFR, Est African American: 66 mL/min
GFR, Est Non African American: 57 mL/min — ABNORMAL LOW
Glucose, Bld: 163 mg/dL — ABNORMAL HIGH (ref 70–99)
Potassium: 3.7 mEq/L (ref 3.5–5.3)
Sodium: 141 mEq/L (ref 135–145)
Total Protein: 7.6 g/dL (ref 6.0–8.3)

## 2013-12-20 LAB — LIPID PANEL
Cholesterol: 171 mg/dL (ref 0–200)
HDL: 45 mg/dL (ref >39)
LDL CALC: 78 mg/dL (ref 0–99)
TRIGLYCERIDES: 240 mg/dL — AB (ref <150)
Total CHOL/HDL Ratio: 3.8 Ratio
VLDL: 48 mg/dL — ABNORMAL HIGH (ref 0–40)

## 2013-12-20 LAB — IRON AND TIBC
%SAT: 25 % (ref 20–55)
Iron: 105 ug/dL (ref 42–145)
TIBC: 422 ug/dL (ref 250–470)
UIBC: 317 ug/dL (ref 125–400)

## 2013-12-20 LAB — FOLATE

## 2013-12-20 LAB — FERRITIN: Ferritin: 48 ng/mL (ref 10–291)

## 2013-12-20 LAB — VITAMIN B12: Vitamin B-12: 948 pg/mL — ABNORMAL HIGH (ref 211–911)

## 2013-12-22 MED ORDER — EZETIMIBE 10 MG PO TABS: 10.0000 mg | ORAL_TABLET | Freq: Every day | ORAL | Status: DC

## 2013-12-22 NOTE — Addendum Note (Signed)
Addended by: Denman George B on: 12/22/2013 01:36 PM   Modules accepted: Orders

## 2013-12-23 ENCOUNTER — Other Ambulatory Visit: Payer: Self-pay

## 2013-12-23 ENCOUNTER — Ambulatory Visit (INDEPENDENT_AMBULATORY_CARE_PROVIDER_SITE_OTHER): Payer: Commercial Managed Care - HMO | Admitting: Neurology

## 2013-12-23 DIAGNOSIS — Z8673 Personal history of transient ischemic attack (TIA), and cerebral infarction without residual deficits: Secondary | ICD-10-CM

## 2013-12-23 DIAGNOSIS — G40209 Localization-related (focal) (partial) symptomatic epilepsy and epileptic syndromes with complex partial seizures, not intractable, without status epilepticus: Secondary | ICD-10-CM

## 2013-12-23 DIAGNOSIS — Z86018 Personal history of other benign neoplasm: Secondary | ICD-10-CM

## 2013-12-23 DIAGNOSIS — G43009 Migraine without aura, not intractable, without status migrainosus: Secondary | ICD-10-CM

## 2013-12-23 DIAGNOSIS — I10 Essential (primary) hypertension: Secondary | ICD-10-CM

## 2013-12-23 DIAGNOSIS — Z9889 Other specified postprocedural states: Secondary | ICD-10-CM

## 2013-12-23 MED ORDER — HYDROCHLOROTHIAZIDE 25 MG PO TABS: 25.0000 mg | ORAL_TABLET | Freq: Every day | ORAL | Status: DC

## 2013-12-23 MED ORDER — ROSUVASTATIN CALCIUM 5 MG PO TABS: ORAL_TABLET | ORAL | Status: DC

## 2013-12-23 MED ORDER — CLONIDINE HCL 0.3 MG PO TABS: ORAL_TABLET | ORAL | Status: DC

## 2013-12-25 DIAGNOSIS — N189 Chronic kidney disease, unspecified: Secondary | ICD-10-CM

## 2013-12-25 DIAGNOSIS — F319 Bipolar disorder, unspecified: Secondary | ICD-10-CM

## 2013-12-25 DIAGNOSIS — E1065 Type 1 diabetes mellitus with hyperglycemia: Secondary | ICD-10-CM

## 2013-12-26 ENCOUNTER — Ambulatory Visit (INDEPENDENT_AMBULATORY_CARE_PROVIDER_SITE_OTHER): Payer: Commercial Managed Care - HMO | Admitting: Internal Medicine

## 2013-12-26 ENCOUNTER — Encounter: Payer: Self-pay | Admitting: Internal Medicine

## 2013-12-26 VITALS — BP 122/60 | HR 80 | Ht 59.0 in | Wt 175.8 lb

## 2013-12-26 DIAGNOSIS — R911 Solitary pulmonary nodule: Secondary | ICD-10-CM

## 2013-12-26 DIAGNOSIS — G4710 Hypersomnia, unspecified: Secondary | ICD-10-CM

## 2013-12-26 DIAGNOSIS — J449 Chronic obstructive pulmonary disease, unspecified: Secondary | ICD-10-CM

## 2013-12-26 MED ORDER — MOMETASONE FURO-FORMOTEROL FUM 100-5 MCG/ACT IN AERO: INHALATION_SPRAY | RESPIRATORY_TRACT | Status: DC

## 2013-12-26 NOTE — Assessment & Plan Note (Signed)
She seems to be functioning better today

## 2013-12-26 NOTE — Patient Instructions (Signed)
Script sent refilling Dulera maintenance inhaler  We can keep the appointment to come back January 14, unless you need our help sooner

## 2013-12-26 NOTE — Procedures (Signed)
ELECTROENCEPHALOGRAM REPORT  Date of Study: 12/23/2013  Patient's Name: [name] MRN: [id number] Date of Birth: Jan 15, 1951  Referring Provider: Metta Clines, DO  Indication: 63 year old woman with history of stroke, meningioma resection, seizure disorder and polypharmacy who has occasional episodes of confused talk and slurred speech.  Medications: Lamatrogine, hydromorphone, levothyroxine, methocarbamol, mirtazepine, sertraline, oxycodone-acetaminophen  Technical Summary: This is a multichannel digital EEG recording, using the international 10-20 placement system.  Spike detection software was employed.  Description: The EEG background is symmetric, with a well-developed posterior dominant rhythm of 8-9 Hz, which is reactive to eye opening and closing.  Diffuse beta activity is seen, with a bilateral frontal preponderance.  No focal or generalized abnormalities are seen.  No focal or generalized epileptiform discharges are seen.  Stage II sleep is seen, with normal and symmetric sleep patterns.  Photic stimulation was performed, and produced no abnormalities.  Hyperventilation was not performed.  ECG revealed normal cardiac rate and rhythm.  Impression: This is a normal routine EEG of the awake and asleep states, with activating procedures.  A normal study does not rule out the possibility of a seizure disorder in this patient.  Adam R. Tomi Likens, DO

## 2013-12-26 NOTE — Assessment & Plan Note (Signed)
Better control. Plan-refill Dulera. We looked at her polypharmacy and did not feel she needed a rescue inhaler now

## 2013-12-26 NOTE — Progress Notes (Signed)
06/18/12- 68 yoF referred courtesy of Dr Joycelyn Schmid [name]-sleep study last year per patient. She reports that she was witnessed stopping breathing in sleep, has a history of loud snoring and is a restless sleeper with frequent waking at night, bathroom trips and nightmares. Easy dyspnea on exertion during the day. She does smoke. New cough and sinus pressure this week attributed to "allergy" with green nasal discharge and no headache. She was sent to hospital April 1 when she says she couldn't wake up while at her doctor's office. She sleeps propped up. Gets sleepy in 30 minutes watching TV during the day. Bedtime 10 PM, sleep latency one or 2 hours, waking during the night 3 or 4 times before up at 8 AM. She had a sleep study at [facility] and was given CPAP but says she can't use it because the machine started smoking and she didn't like the pressure. She was changed from [place] to [place]. She is living alone, divorced and disabled. Son has OSA. History of meningioma 11/23/2011. No seizure in over 3 years. Does not drive. NPSG 3/8/13[name]- mild obstructive sleep apnea, AHI 14 per hour, mostly in REM. Epworth sleepiness score 14/24. Weight 164 pounds.  09/26/12- 57 yoF  Former smoker followed for obstructive sleep apnea follows for:  review download from [facility] with pt today.  pt stated that her back has been hurting her.   Did stop smoking! Some wheeze with exertion. She complains that she is falling asleep before she puts her CPAP/ auto/ [employer] on. Download demonstrated poor compliance and control. She had complained of a long history of difficulty falling asleep. Says a neurologist had questioned narcolepsy. She denies cataplexy or sleep paralysis.  11/19/12- 46 yoF  Former smoker followed for obstructive sleep apnea, ? Lung nodule, complicated by hx resection of meningioma FOLLOWS FOR:  Wearing CPAP 8/ [employer] 6-8 hours per night.  Discuss lung nodule was told she had by Dr.  Tula Nakayama Sleep is comfortable with CPAP. Nuvigil sample did not help with daytime alertness. There was questionable lung nodule but not seen on recent CT chest. Incidental slip in bathroom last week- black eye. CT 08/19/12 IMPRESSION:  The opacity in the inferior segment of the lingula of the left  upper lobe is without significant change. It is morphology is  consistent with scarring and / or atelectasis. This does not have  the configuration of a nodule. Other minor areas of reticular  subsegmental atelectasis are stable as well. There is no discrete  lung nodule. The lungs are otherwise clear.  No additional short-term follow-up is felt indicated. There are no  findings suspicious for neoplastic disease.  Original Report Authenticated By: Lajean Manes, M.D.  05/21/13- 51 yoF  Former smoker followed for obstructive sleep apnea, R/O'd Lung nodule, complicated by hx resection of meningioma FOLLOWS FOR: has been using old CPAP machine(has been waiting for [facility]  to get her outlets changed over for her new CPAP machine). They have done this and still using old CPAP. Pt states Dr Moshe Cipro wants her to have another sleep study. NPSG 05/29/11- AHI 14/ hr CPAP 8/ [employer] Always sleepy with bedtime between 8 and 9 PM, at 7:30 AM. At least twice during the night. "addicted to Ativan"-withdrawal if she skips it. CT chest 12/16/12 IMPRESSION:  Minimal chronic atelectasis versus scarring at the bases of left  lower lobe and lingula.  Otherwise negative exam, with no evidence of pulmonary nodule.  No further workup required.  Electronically Signed  By: Lavonia Dana M.D.  On: 12/16/2012 12:09  08/12/13- 60 yoF Former smoker followed for obstructive sleep apnea, R/O'd Lung nodule, complicated by hx resection of meningioma. Bipolar, GERD, DM Follows for:  Pt here to discuss results of her slpit night sleep study. She is still using her old CPAP machine and is waking up several times per night.    09/23/13- 34 yoF Former smoker followed for obstructive sleep apnea, R/O'd Lung nodule, complicated by hx resection of meningioma. Bipolar, GERD, DM FOLLOWS FOR: Pt states that she is trying to use CPAP 8/ [employer] as much as possible. Only ranging between 2-5hrs at night. Pt states that mask does not fit well. Pt reports that Nuvigil did not work for her. Pt c/o some chest pressure, wheezing, SOB and fatigue.   10/30/13- 73 yoF Former smoker followed for obstructive sleep apnea with hypersomnia, R/O'd Lung nodule, complicated by hx resection of meningioma. Bipolar, GERD, DM Referred by Dr Moshe Cipro for new problem of wheezing dyspnea Remote history of pneumonia with no history of asthma or other known lung disease. She had smoked for 15 or 20 years, stopping in 2014. The patient complains of shortness of breath with exertion and wheezing noted especially in the last 4 weeks with no significant triggering event. She does not recognize that she had an infection. She will wheeze some sitting quietly but has not noticed wheezing lying down, when she is wearing her CPAP. CPAP 8/ [employer]- All night every night Gets "strangled" occasionally trying to swallow a pill or food. Has some history of reflux Office Spirometry 10/30/2013-submaximal effort based on appearance of loop and curve. Numbers would fit with severe restriction but her physiologic capability may be better than this. FVC 0.91/44%, FEV1 0.74/45%, FEV1/FVC 0.81, FEF 25-75% 1.43/69%. CXR 09/15/13 IMPRESSION:  The study is limited due to hypoinflation. Minimal infrahilar  atelectasis on the right is suspected.  Electronically Signed  By: David Martinique  On: 09/15/2013 12:45  12/26/13- 27 yoF Former smoker followed for obstructive sleep apnea with hypersomnia, , R/O'd Lung nodule, complicated by hx resection of meningioma. Bipolar, GERD, DM FOLLOWS FOR:  breathing is worse without the inhaler.  increase SOB and wheezing with exertion.  She feels she  is doing better. Less sleepy .Dulera inhaler does help, needs refill. Has had flu and pneumonia vaccines. CXR 10/30/13 IMPRESSION:  Chronic RIGHT basilar atelectasis.  Electronically Signed  By: Lavonia Dana M.D.  On: 10/30/2013 16:57  ROS-see HPI Constitutional:   No-   weight loss, night sweats, fevers, chills, +fatigue, lassitude. HEENT:  + headaches, +difficulty swallowing, tooth/dental problems, sore throat,       No-sneezing, itching, ear ache, nasal congestion, post nasal drip,  CV:  +chest pain, orthopnea, PND, swelling in lower extremities, anasarca, dizziness, +palpitations Resp: +  shortness of breath with exertion or at rest.                productive cough,  + non-productive cough,  No- coughing up of blood.              No-   change in color of mucus.  No- wheezing.   Skin: No-   rash or lesions. GI:  +heartburn, +indigestion, no-abdominal pain, nausea, vomiting,  GU:  MS:  + joint pain or swelling.   Neuro-     nothing unusual Psych:  No- change in mood or affect. +depression or +anxiety.  No memory loss.  OBJ- Physical Exam - more alert than usual  General-  Oriented, Affect-flat, Distress- none acute Skin- rash-none, lesions- none, excoriation- none Lymphadenopathy- none Head- Eyes- Gross vision intact, PERRLA, conjunctivae and secretions clear            Ears- Hearing, canals-normal            Nose- Clear, no-Septal dev, mucus, polyps, erosion, perforation             Throat- Mallampati III , mucosa clear , drainage- none, tonsils- atrophic, + dentures Neck- flexible , trachea midline, no stridor , thyroid nl, carotid no bruit Chest - symmetrical excursion , unlabored           Heart/CV- RRR , no murmur , no gallop  , no rub, nl s1 s2                           - JVD- none , edema- none, stasis changes- none, varices- none           Lung- clear to P&A, wheeze- none, cough+rattling , dullness-none, rub- none           Chest wall-  Abd- soft Br/ Gen/ Rectal- Not  done, not indicated Extrem- cyanosis- none, clubbing, none, atrophy- none, strength- nl. +cane Neuro- + somewhat slow but oriented. Less drowsy/ torpid than previously.

## 2013-12-26 NOTE — Assessment & Plan Note (Signed)
CXR not showing a progressive process

## 2013-12-28 NOTE — Assessment & Plan Note (Addendum)
Difficult to assess. History of craniotomy for meningioma, seizure, psychosis, CVA, " addicted to Ativan " Plan-have his is on good sleep hygiene, minimize sedating medications were possible, use CPAP all night every night. It home care company to work on mask fit for comfort.

## 2013-12-28 NOTE — Assessment & Plan Note (Signed)
Plan-sample Breo Ellipta. Encourage walking and slow deep breaths to expand the lungs and build stamina

## 2013-12-29 ENCOUNTER — Telehealth: Payer: Self-pay | Admitting: *Deleted

## 2013-12-29 NOTE — Telephone Encounter (Signed)
Message copied by Claudie Revering on Mon Dec 29, 2013  1:13 PM ------      Message from: JAFFE, ADAM R      Created: Fri Dec 26, 2013  7:26 AM       eeg normal      ----- Message -----         From: Dudley Major, DO         Sent: 12/26/2013   6:55 AM           To: Dudley Major, DO                   ------

## 2013-12-29 NOTE — Telephone Encounter (Signed)
EEG was normal.

## 2013-12-30 ENCOUNTER — Telehealth: Payer: Self-pay

## 2013-12-30 DIAGNOSIS — M25551 Pain in right hip: Secondary | ICD-10-CM

## 2013-12-30 NOTE — Telephone Encounter (Signed)
Message copied by Bernita Raisin on Tue Dec 30, 2013  8:57 AM ------      Message from: [name]      Created: Mon Dec 22, 2013  5:53 PM       I would suggest  She call him for an appt if the pain is severe since  her hip xray is normal      Ok to enter referral if she needs this I will sign ------

## 2013-12-30 NOTE — Telephone Encounter (Signed)
refreral entered.

## 2013-12-31 ENCOUNTER — Telehealth (HOSPITAL_COMMUNITY): Payer: Self-pay | Admitting: *Deleted

## 2013-12-31 NOTE — Telephone Encounter (Signed)
Pt called and stated that she has not had a stress test in 5 years and would like to know if Dr. Debara Pickett would schedule one for her.

## 2013-12-31 NOTE — Telephone Encounter (Signed)
LMTCB

## 2014-01-01 NOTE — Telephone Encounter (Signed)
Spoke with patient. She has not cardiac complaints, just wanted to know since she is a diabetic and has high blood pressure if she should have another stress test. Will defer to Dr. Debara Pickett

## 2014-01-01 NOTE — Telephone Encounter (Signed)
LMTCB

## 2014-01-04 IMAGING — CT CT HEAD W/O CM
1 series · 16 of 30 positions shown, 20 images · non-contrast
Comparison: March 09, 2012.

CLINICAL DATA: Altered mental status

CT HEAD WITHOUT CONTRAST
TECHNIQUE: Contiguous axial images were obtained from the base of
the skull through the vertex without contrast.

[Series 2: headseq 4.8 h37s · axial · 0.46mm/px · z∈[+86,+221]mm · 16 of 30 slices shown, 20 images]
[im 2/30  brain]
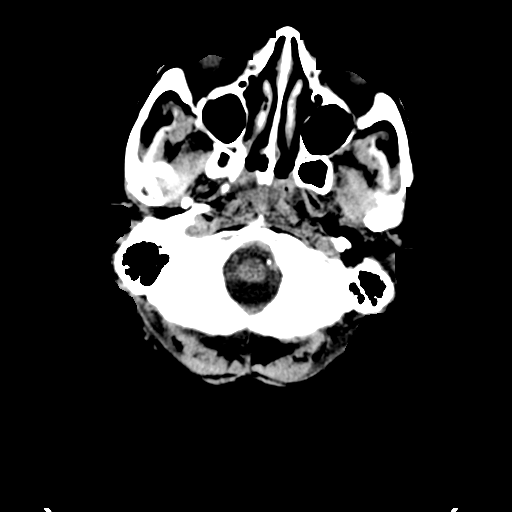
[im 2/30  bone]
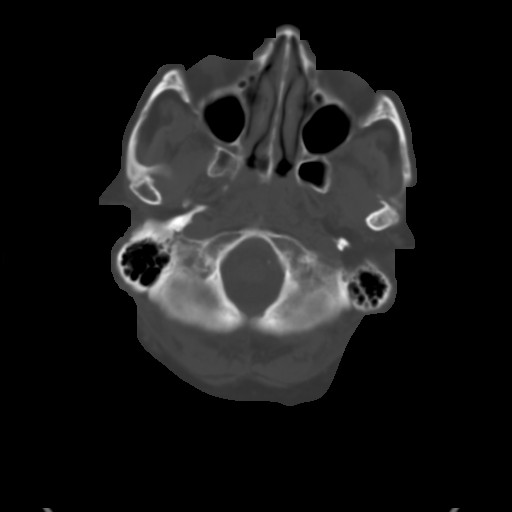
[im 4/30  brain]
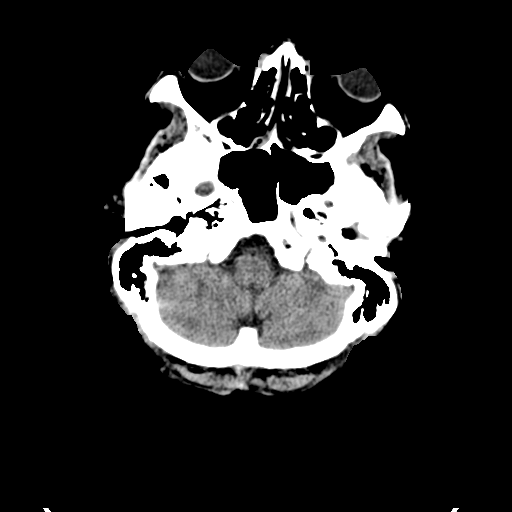
[im 6/30  brain]
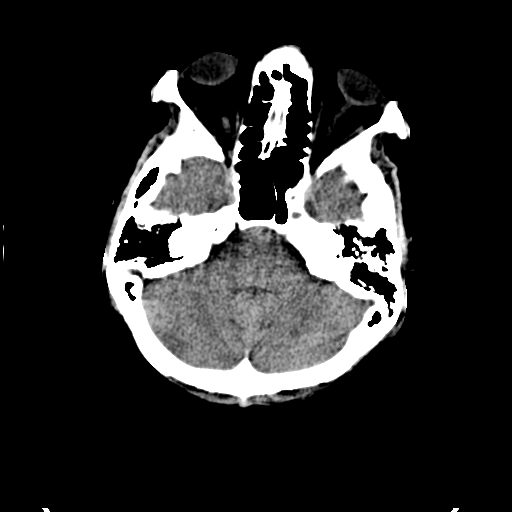
[im 8/30  brain]
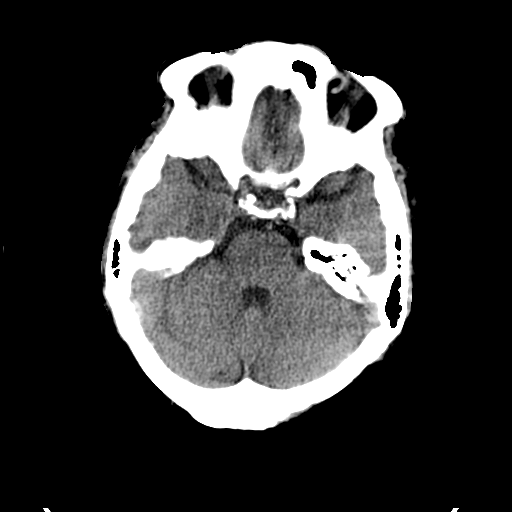
[im 9/30  brain]
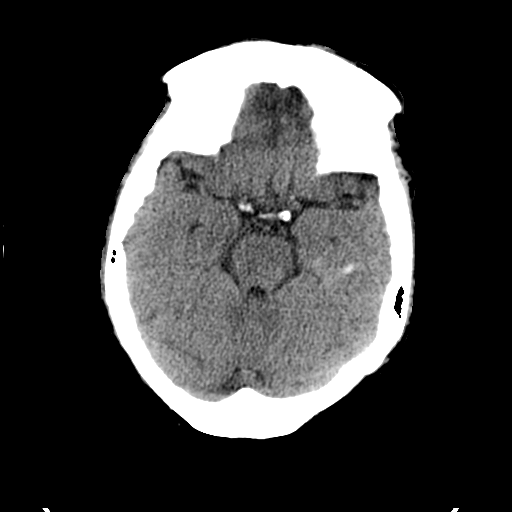
[im 9/30  bone]
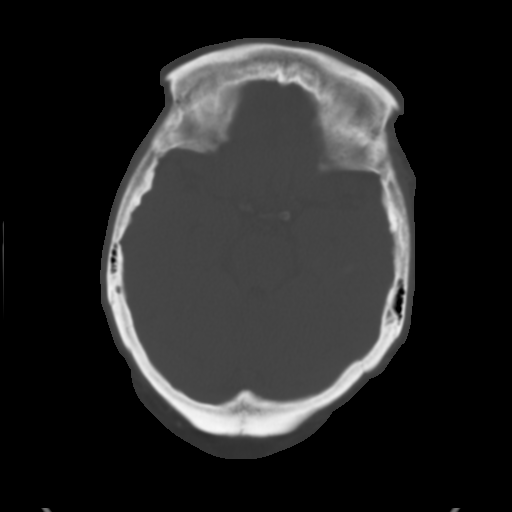
[im 11/30  brain]
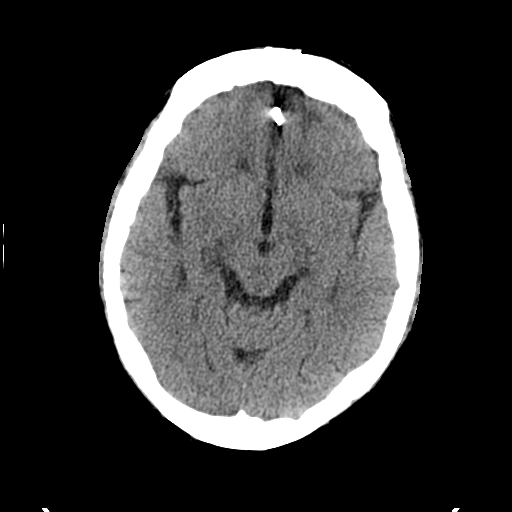
[im 13/30  brain]
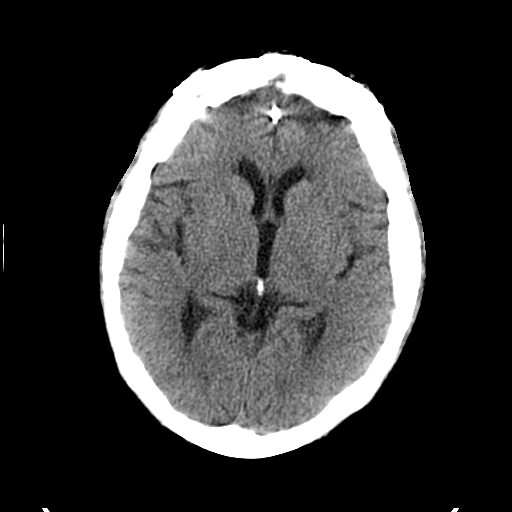
[im 15/30  brain]
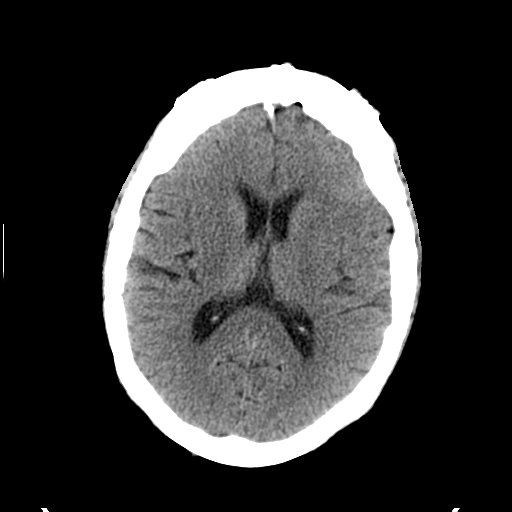
[im 16/30  brain]
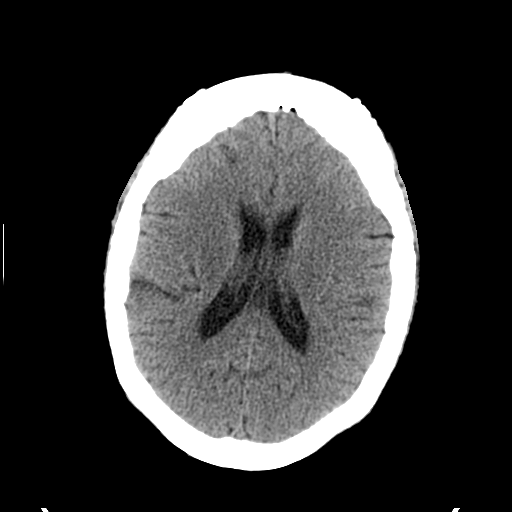
[im 16/30  bone]
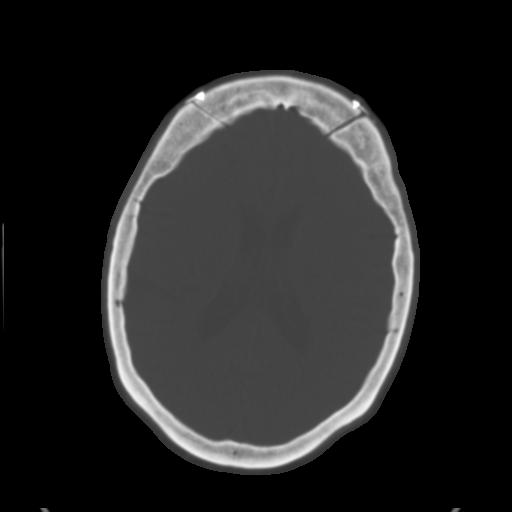
[im 18/30  brain]
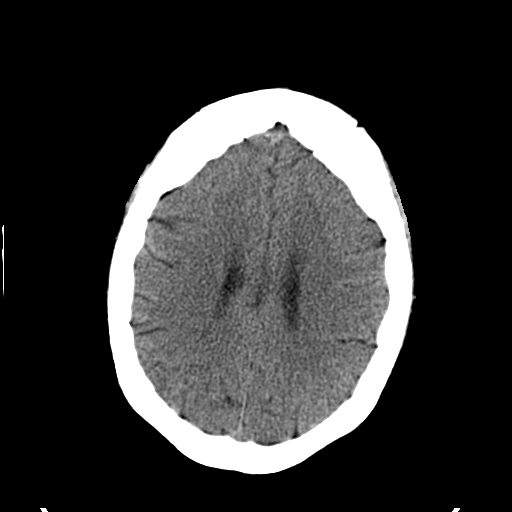
[im 20/30  brain]
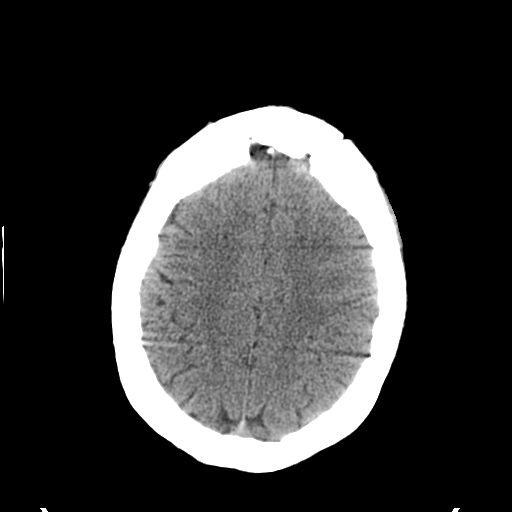
[im 22/30  brain]
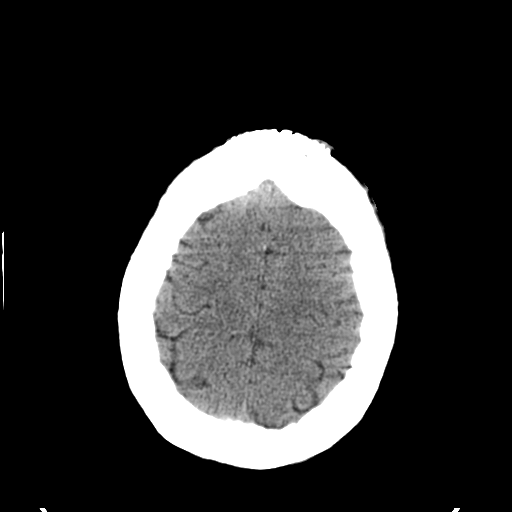
[im 23/30  brain]
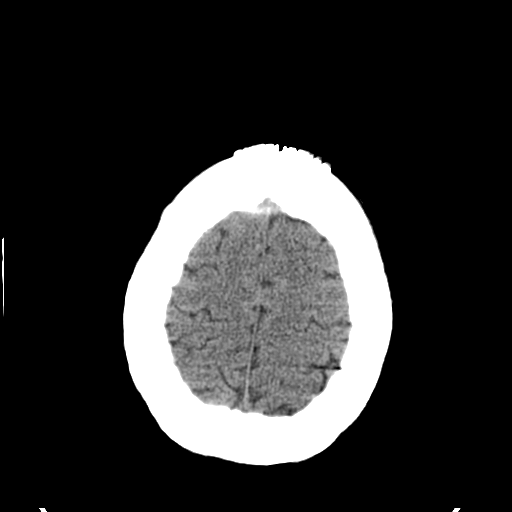
[im 23/30  bone]
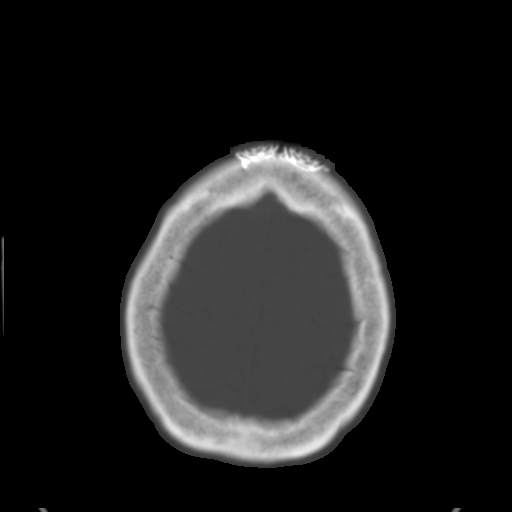
[im 25/30  brain]
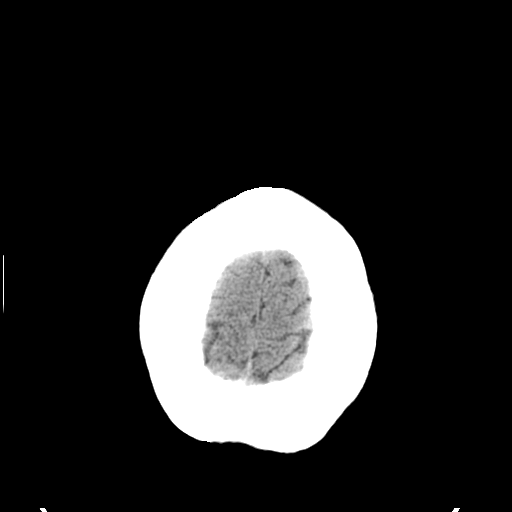
[im 27/30  brain]
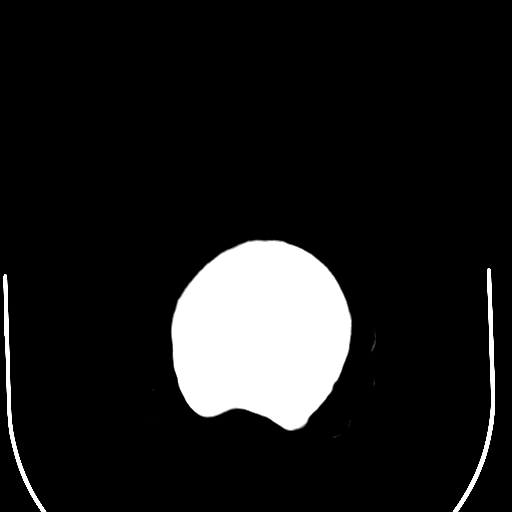
[im 29/30  brain]
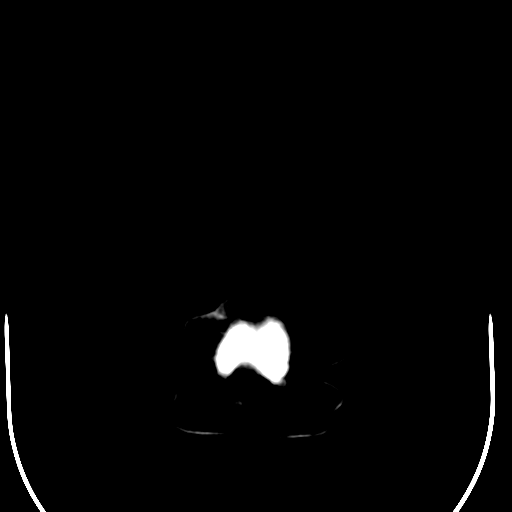

[16 of 30 positions shown; findings below may reference images not displayed]

FINDINGS: Postoperative changes involving the frontal skull are
again noted and unchanged compared to prior exam.  No acute
abnormalities seen involving the bony calvarium.  Postoperative
changes are seen involving the both frontal lobes with surgical
clips present.  There is no mass effect or midline shift.
Ventricular size is within normal limits.  No extra-axial fluid
collection is noted.  There is no evidence of mass lesion,
hemorrhage or acute infarction.
IMPRESSION: Postoperative changes seen involving the frontal skull and lobes as
described above.  These are unchanged compared to prior exam.  No
acute intracranial abnormality is seen.

## 2014-01-04 IMAGING — CR DG CHEST 1V
1 series · 1 of 1 positions shown · non-contrast
Comparison: 02/15/2012

CLINICAL DATA: Altered mental status, confusion

CHEST - 1 VIEW

[view not recorded]
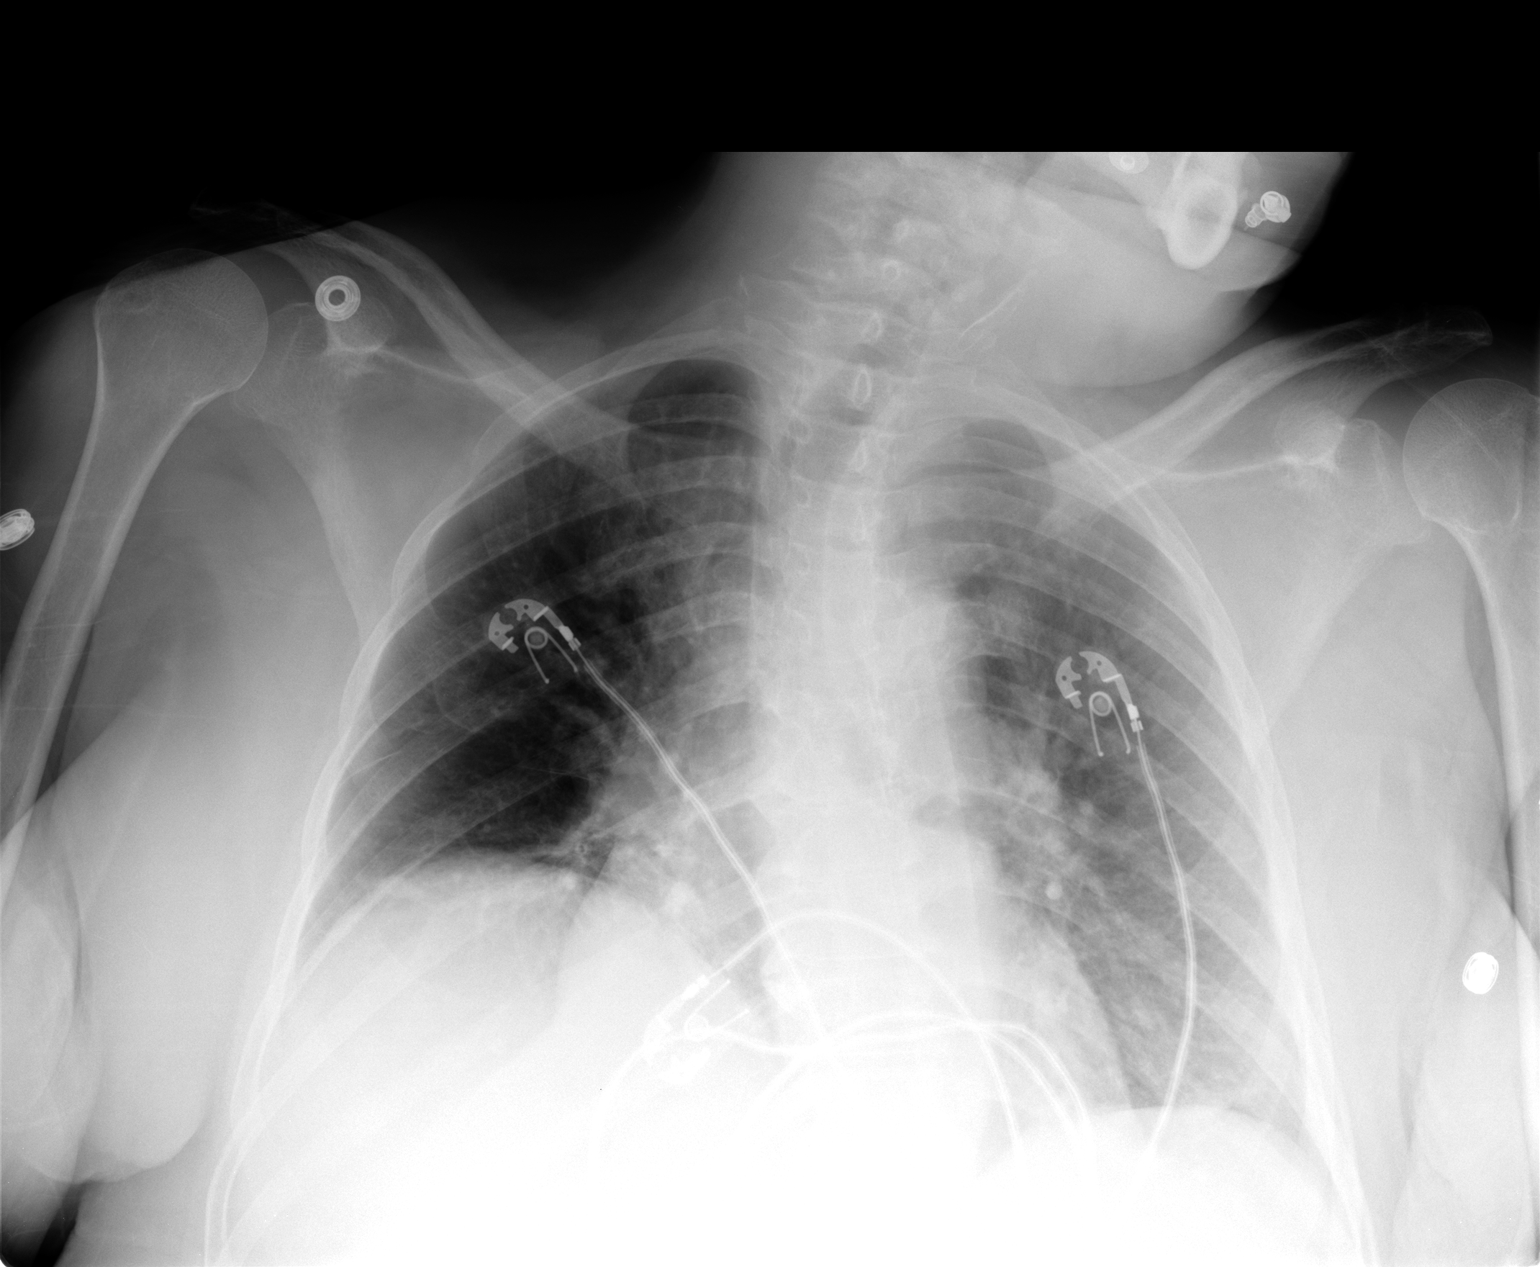

[1 of 1 positions shown; findings below may reference images not displayed]

FINDINGS: Low lung volumes with bibasilar opacities, likely
atelectasis.

Increasing opacity in the left hemithorax likely reflects overlying
soft tissue with patient rotation.

No pleural effusion or pneumothorax.

Mild cardiomegaly.
IMPRESSION: No evidence of acute cardiopulmonary disease.

## 2014-01-05 ENCOUNTER — Encounter: Payer: Self-pay | Admitting: Internal Medicine

## 2014-01-05 ENCOUNTER — Ambulatory Visit (INDEPENDENT_AMBULATORY_CARE_PROVIDER_SITE_OTHER): Payer: Commercial Managed Care - HMO | Admitting: Internal Medicine

## 2014-01-05 VITALS — BP 110/58 | HR 92 | Temp 98.6°F | Resp 12 | Ht 60.0 in | Wt 172.6 lb

## 2014-01-05 DIAGNOSIS — E1165 Type 2 diabetes mellitus with hyperglycemia: Principal | ICD-10-CM

## 2014-01-05 DIAGNOSIS — E039 Hypothyroidism, unspecified: Secondary | ICD-10-CM

## 2014-01-05 DIAGNOSIS — E042 Nontoxic multinodular goiter: Secondary | ICD-10-CM

## 2014-01-05 DIAGNOSIS — IMO0001 Reserved for inherently not codable concepts without codable children: Secondary | ICD-10-CM

## 2014-01-05 DIAGNOSIS — Z794 Long term (current) use of insulin: Principal | ICD-10-CM

## 2014-01-05 DIAGNOSIS — E1065 Type 1 diabetes mellitus with hyperglycemia: Secondary | ICD-10-CM

## 2014-01-05 MED ORDER — METFORMIN HCL 500 MG PO TABS: 1000.0000 mg | ORAL_TABLET | Freq: Two times a day (BID) | ORAL | Status: DC

## 2014-01-05 MED ORDER — INSULIN ASPART 100 UNIT/ML FLEXPEN: 16.0000 [IU] | PEN_INJECTOR | Freq: Three times a day (TID) | SUBCUTANEOUS | Status: DC

## 2014-01-05 NOTE — Patient Instructions (Signed)
Please stop Tradjenta. Continue: - Lantus 50 units at bedtime  Increase: - Novolog to: 14 units with a smaller meal 17 units with a larger meal 20 units with a large meal Add: - Please start Metformin 500 mg with dinner x 4 days. If you tolerate this well, add another Metformin tablet (500 mg) with breakfast x 4 days. If you tolerate this well, add another metformin tablet with dinner (total 1000 mg) x 4 days. If you tolerate this well, add another metformin tablet with breakfast (total 1000 mg). Continue with 1000 mg of metformin 2x a day with breakfast and dinner.  Please return in 1 month with your sugar log.   PATIENT INSTRUCTIONS FOR TYPE 2 DIABETES:  **Please join MyChart!** - see attached instructions about how to join if you have not done so already.  DIET AND EXERCISE Diet and exercise is an important part of diabetic treatment.  We recommended aerobic exercise in the form of brisk walking (working between 40-60% of maximal aerobic capacity, similar to brisk walking) for 150 minutes per week (such as 30 minutes five days per week) along with 3 times per week performing 'resistance' training (using various gauge rubber tubes with handles) 5-10 exercises involving the major muscle groups (upper body, lower body and core) performing 10-15 repetitions (or near fatigue) each exercise. Start at half the above goal but build slowly to reach the above goals. If limited by weight, joint pain, or disability, we recommend daily walking in a swimming pool with water up to waist to reduce pressure from joints while allow for adequate exercise.    BLOOD GLUCOSES Monitoring your blood glucoses is important for continued management of your diabetes. Please check your blood glucoses 2-4 times a day: fasting, before meals and at bedtime (you can rotate these measurements - e.g. one day check before the 3 meals, the next day check before 2 of the meals and before bedtime, etc.).   HYPOGLYCEMIA (low  blood sugar) Hypoglycemia is usually a reaction to not eating, exercising, or taking too much insulin/ other diabetes drugs.  Symptoms include tremors, sweating, hunger, confusion, headache, etc. Treat IMMEDIATELY with 15 grams of Carbs:   4 glucose tablets    cup regular juice/soda   2 tablespoons raisins   4 teaspoons sugar   1 tablespoon honey Recheck blood glucose in 15 mins and repeat above if still symptomatic/blood glucose <100.  RECOMMENDATIONS TO REDUCE YOUR RISK OF DIABETIC COMPLICATIONS: * Take your prescribed MEDICATION(S) * Follow a DIABETIC diet: Complex carbs, fiber rich foods, (monounsaturated and polyunsaturated) fats * AVOID saturated/trans fats, high fat foods, >2,300 mg salt per day. * EXERCISE at least 5 times a week for 30 minutes or preferably daily.  * DO NOT SMOKE OR DRINK more than 1 drink a day. * Check your FEET every day. Do not wear tightfitting shoes. Contact us if you develop an ulcer * See your EYE doctor once a year or more if needed * Get a FLU shot once a year * Get a PNEUMONIA vaccine once before and once after age 60 years  GOALS:  * Your Hemoglobin A1c of <7%  * fasting sugars need to be <130 * after meals sugars need to be <180 (2h after you start eating) * Your Systolic BP should be 341 or lower  * Your Diastolic BP should be 80 or lower  * Your HDL (Good Cholesterol) should be 40 or higher  * Your LDL (Bad Cholesterol) should be 100 or  lower. * Your Triglycerides should be 150 or lower  * Your Urine microalbumin (kidney function) should be <30 * Your Body Mass Index should be 25 or lower    Please consider the following ways to cut down carbs and fat and increase fiber and micronutrients in your diet: - substitute whole grain for white bread or pasta - substitute brown rice for white rice - substitute 90-calorie flat bread pieces for slices of bread when possible - substitute sweet potatoes or yams for white potatoes - substitute  humus for margarine - substitute tofu for cheese when possible - substitute almond or rice milk for regular milk (would not drink soy milk daily due to concern for soy estrogen influence on breast cancer risk) - substitute dark chocolate for other sweets when possible - substitute water - can add lemon or orange slices for taste - for diet sodas (artificial sweeteners will trick your body that you can eat sweets without getting calories and will lead you to overeating and weight gain in the long run) - do not skip breakfast or other meals (this will slow down the metabolism and will result in more weight gain over time)  - can try smoothies made from fruit and almond/rice milk in am instead of regular breakfast - can also try old-fashioned (not instant) oatmeal made with almond/rice milk in am - order the dressing on the side when eating salad at a restaurant (pour less than half of the dressing on the salad) - eat as little meat as possible - can try juicing, but should not forget that juicing will get rid of the fiber, so would alternate with eating raw veg./fruits or drinking smoothies - use as little oil as possible, even when using olive oil - can dress a salad with a mix of balsamic vinegar and lemon juice, for e.g. - use agave nectar, stevia sugar, or regular sugar rather than artificial sweateners - steam or broil/roast veggies  - snack on veggies/fruit/nuts (unsalted, preferably) when possible, rather than processed foods - reduce or eliminate aspartame in diet (it is in diet sodas, chewing gum, etc) Read the labels!  Try to read Dr. Janene Harvey book: "Program for Reversing Diabetes" for other ideas for healthy eating.

## 2014-01-05 NOTE — Progress Notes (Addendum)
Patient ID: [name], female   DOB: 02-07-1951, 63 y.o.   MRN: [id number]  HPI: [name] is a 63 y.o.-year-old female, referred by her PCP, [name], for management of DM2, insulin-dependent, uncontrolled, with complications (gastroparesis, cerebrovasc. Ds-  H/o stroke, PN).  DM2: Patient has been diagnosed with diabetes in 1995; she started insulin in 1997.  Last hemoglobin A1c was: 10/28/2013: HbA1c 8.5% Lab Results  Component Value Date   HGBA1C 8.3* 08/06/2013   HGBA1C 7.2* 04/11/2013   HGBA1C 7.1* 01/09/2013   Pt is on a regimen of: - Tradjenta 5 mg daily in am - Lantus 50 units at bedtime - Novolog 11-16 units 4x a day She tried Metformin in the past >> tolerated it well  Pt checks her sugars 4 a day and they are: - am: 153, 200-300, some 400s - 2h after b'fast: n/c - before lunch: 192-243 - 2h after lunch: n/c - before dinner: 200-292 - 2h after dinner: n/c - bedtime: 190-200s, rarely 300s - nighttime: n/c No lows. Lowest sugar was 83 x1; she has hypoglycemia awareness at 70.  Highest sugar was 400s.  Pt's meals are: - Breakfast: eggs, cereals, fruit, oatmeal, bacon - Lunch: sandwich, beef, mashed potatoes,diet soda - Dinner: chicken, beef, fish, potatoes, vegetables - Snacks: 1-2: fruit  She exercises 1x a da - walks.  - + mild CKD, last BUN/creatinine:  Lab Results  Component Value Date   BUN 23 12/19/2013   CREATININE 1.04 12/19/2013  On Losartan. - last set of lipids: Lab Results  Component Value Date   CHOL 171 12/19/2013   HDL 45 12/19/2013   LDLCALC 78 12/19/2013   TRIG 240* 12/19/2013   CHOLHDL 3.8 12/19/2013  On Ezetimibe, was on Crestor, but stopped.  - last eye exam was in 09/2013. No DR.  - + numbness and tingling in her leg L leg (affected by stroke) but also in R. Sees podiatry.  Pt has FH of DM in mother, father, brothers.  Hypothyroidism: - dx > 10 years ago. - On Levoxyl 50 mcg daily:  - in am (at 7 am) - fasting - with  water - >30 min b'f b'fast - no Ca - no Fe - + MVI 5 hrs later - + Aciphex at 7 am!  Last TSH: Lab Results  Component Value Date   TSH 1.453 08/02/2011   TSH 0.666 09/14/2009   TSH 0.295  05/30/2009   TSH 1.126 03/18/2009   TSH 0.104  09/15/2008   TSH 0.066  09/14/2008   TSH 1.041  02/03/2008   TSH 0.992 09/25/2007   TSH 0.257 08/25/2007   TSH 1.181 05/23/2006   She has a h/o thyroid nodules. + dysphagia, + hoarseness.  Reviewed thyroid U/S (10/2013):  Right thyroid lobe: 3.7 x 1.0 x 1.8 cm   Left thyroid lobe: 3.5 x 1.5 x 1.6 cm   Isthmus: 0.5 cm   Focal nodules: There is a 0.3 mm calcified nodule in the right midzone. There is a 0.6 x 0.7 x 0.5 cm hypoechoic nodule in the  lateral aspect of the right midzone. There is a 0.7 x 0.6 x 0.5 cm hyperechoic nodule in the lateral aspect of the right lower pole.  There is a 2.2 x 1.1 x 1.7 cm complex solid nodule in the inferior aspect of the isthmus extending adjacent to the left lower pole.  There is a 1.4 x 2.1 x 1.4 cm complex solid nodule in the left lower pole.  Lymphadenopathy: None visualized.  FNA (11/05/2013) x2: benign   No FH of thyroid ds.   ROS: Constitutional: + weight gain, + fatigue, no subjective hyperthermia/hypothermia, + excessive urination, + poor sleep Eyes: + blurry vision, no xerophthalmia ENT: + sore throat, + nodules palpated in throat, + dysphagia/no odynophagia,+ hoarseness Cardiovascular: no CP/SOB/palpitations/leg swelling Respiratory: no cough/+ SOB/+ wheezing Gastrointestinal: + N/no V/+ D/+ C/+ acid reflux Musculoskeletal: no muscle/joint aches Skin: no rashes, + itching, + hair loss Neurological: no tremors/numbness/tingling/dizziness, + seizures, + HA Psychiatric: + both depression/anxiety  Past Medical History  Diagnosis Date  . Bipolar disorder   . CVA (cerebral infarction)   . Pancreatitis 2006    due to Depakote with normal EUS   . Osteoporosis   . Chronic back pain   . Diabetes  mellitus   . Trigger finger   . Anxiety disorder   . Hypertension   . Migraines     chronic headaches  . Diverticulosis     TCS 9/08 by Dr. Delfin Edis for diarrhea . Bx for micro scopic colitis negative.   . Schatzki's ring     non critical / EGD with ED 8/2011with RMR  . S/P colonoscopy 1517,6160, 2011    left-sided diverticula, hx of simple adenomas . 2011, random bx negative for microscopic colitis  . Glaucoma   . Allergic rhinitis   . Hypothyroidism     thyroid goiter  . Anemia   . Blood transfusion   . GERD (gastroesophageal reflux disease)   . Stroke     left sided weakness  . Seizures     unknown etiology-on meds-last seizure was 3 yerars ago  . Anxiety   . Depression   . Metabolic encephalopathy 7/37/1062  . Sleep apnea     on CPAP  . Arthritis   . Gum symptoms     infection on antibiotic  . Chronic neck pain   . Mononeuritis lower limb   . Frequent falls    Past Surgical History  Procedure Laterality Date  . Abdominal hysterectomy  1978  . Cholecystectomy  1984  . Ovarian cyst removal    . Carpal tunnel release Left 07/22/04    Dr. Aline Brochure  . Breast reduction surgery  1994  . Cataract extraction Bilateral   . Biopsy thyroid  2009  . Surgical excision of 3 tumors from right thigh and right buttock  and left upper thigh  2010  . Back surgery  July 2012  . Spine surgery  09/29/2010    Dr. Rolena Infante  . Maloney dilation  12/29/2010    RMR;  . Esophagogastroduodenoscopy  12/29/2010    Rourk-Retained food in the esophagus and stomach, small hiatal hernia, status post Maloney dilation of the esophagus  . Craniotomy  11/23/2011    Procedure: CRANIOTOMY TUMOR EXCISION;  Surgeon: Hosie Spangle, MD;  Location: [facility];  Service: Neurosurgery;  Laterality: N/A;  Craniotomy for tumor resection  . Brain surgery  11/2011    resection of meningioma  . Colonoscopy N/A 09/25/2012    IRS:WNIOEVO diverticulosis.  colonic polyp-removed : tubular adenoma  .  Esophagogastroduodenoscopy N/A 09/25/2012    JJK:KXFGHWEX atonic baggy esophagus status post Maloney dilation 21 F. Hiatal hernia  . Givens capsule study N/A 01/15/2013    NORMAL.   . Bacterial overgrowth test N/A 05/05/2013    Procedure: BACTERIAL OVERGROWTH TEST;  Surgeon: Daneil Dolin, MD;  Location: [facility];  Service: Endoscopy;  Laterality: N/A;  7:30  .  Transthoracic echocardiogram  2010    EF 60-65%, mild conc LVH, grade 1 diastolic dysfunction; mildly calcified MV annulus with mildly thickened leaflets, mildly calcified MR annulus  . Nm myocar perf wall motion  2006    "relavtiely normal" persantine, mild anterior thinning (breast attenuation artifact), no region of scar/ischemia  . Cardiac catheterization  05/10/2005    normal coronaries, normal LV systolic function and EF (Dr. Jackie Plum)   History   Social History  . Marital Status: Divorced    Spouse Name: N/A    Number of Children: 1  . Years of Education: 12   Occupational History  . disabled     Social History Main Topics  . Smoking status: Current Every Day Smoker -- 0.25 packs/day for 7 years    Types: Cigarettes  . Smokeless tobacco: Never Used     Comment: "started back but off now for 3 months" (08/18/13)  . Alcohol Use: No     Comment:    . Drug Use: No   Current Outpatient Prescriptions on File Prior to Visit  Medication Sig Dispense Refill  . aspirin EC 81 MG tablet Take 81 mg by mouth daily.      . Cholecalciferol (D 5000) 5000 UNITS capsule Take 5,000 Units by mouth daily.      . cloNIDine (CATAPRES) 0.3 MG tablet TAKE 1 TABLET BY MOUTH EVERY EIGHT HOURS. ONCE AT 8AM, 4PM, AND 12 MIDNIGHT.  90 tablet  3  . diclofenac sodium (VOLTAREN) 1 % GEL Apply 2 g topically daily as needed (Pain).      [name] dicyclomine (BENTYL) 10 MG capsule Take 1 capsule (10 mg total) by mouth 4 (four) times daily -  before meals and at bedtime. As needed for cramps and diarrhea  120 capsule  1  . ezetimibe (ZETIA) 10 MG tablet  Take 1 tablet (10 mg total) by mouth daily.  30 tablet  1  . hydrochlorothiazide (HYDRODIURIL) 25 MG tablet Take 1 tablet (25 mg total) by mouth daily.  30 tablet  3  . HYDROmorphone (DILAUDID) 2 MG tablet Take 1 tablet by mouth 3 (three) times daily.      . hydroxychloroquine (PLAQUENIL) 200 MG tablet Take 200 mg by mouth daily.      . insulin glargine (LANTUS) 100 UNIT/ML injection Inject 50 Units into the skin daily.       [name] lamoTRIgine (LAMICTAL) 100 MG tablet Take 150 mg by mouth every morning.       [name] levothyroxine (LEVOXYL) 50 MCG tablet Take 50 mcg by mouth every morning.       . linagliptin (TRADJENTA) 5 MG TABS tablet Take 5 mg by mouth daily.      [name] LORazepam (ATIVAN) 0.5 MG tablet Take 0.5 mg by mouth 2 (two) times daily.       [name] losartan (COZAAR) 100 MG tablet Take 1 tablet (100 mg total) by mouth daily.  30 tablet  11  . Magnesium Oxide 250 MG TABS Take 1 tablet (250 mg total) by mouth daily.  30 tablet  5  . methocarbamol (ROBAXIN) 500 MG tablet Take 500 mg by mouth daily.      . mirtazapine (REMERON) 30 MG tablet Take 30 mg by mouth at bedtime.       . mometasone-formoterol (DULERA) 100-5 MCG/ACT AERO Inhale 2 puffs into the lungs 2 (two) times daily.  1 Inhaler  0  . mometasone-formoterol (DULERA) 100-5 MCG/ACT AERO 2 puffs then rinse mouth, twice daily  1 Inhaler  prn  . montelukast (SINGULAIR) 10 MG tablet TAKE ONE TABLET BY MOUTH ONCE DAILY.  30 tablet  3  . Multiple Vitamins-Minerals (ONE-A-DAY 50 PLUS PO) Take 1 tablet by mouth daily.      [name] NOVOLOG FLEXPEN 100 UNIT/ML FlexPen Inject 8 Units into the skin 4 (four) times daily.       . pregabalin (LYRICA) 75 MG capsule Take 75 mg by mouth 2 (two) times daily.       . Probiotic Product (PROBIOTIC FORMULA PO) Take 1 capsule by mouth daily.      . RABEprazole (ACIPHEX) 20 MG tablet TAKE 1 TABLET BY MOUTH TWICE DAILY.  60 tablet  5  . rosuvastatin (CRESTOR) 5 MG tablet TAKE 1 TABLET BY MOUTH AT BEDTIME.  30 tablet  3  .  sertraline (ZOLOFT) 100 MG tablet Take 100 mg by mouth at bedtime.       [name] spironolactone (ALDACTONE) 25 MG tablet Take 1 tablet (25 mg total) by mouth daily.  30 tablet  4  . terbinafine (LAMISIL) 250 MG tablet Take 1 tablet by mouth daily.      [name] thioridazine (MELLARIL) 10 MG tablet Take 20 mg by mouth at bedtime.       . traZODone (DESYREL) 50 MG tablet Take 50 mg by mouth at bedtime.        No current facility-administered medications on file prior to visit.   Allergies  Allergen Reactions  . Cephalexin Hives  . Iron Nausea And Vomiting  . Milk-Related Compounds Other (See Comments)    Doesn't agree with stomach.   . Penicillins Hives  . Phenazopyridine Hcl Other (See Comments)    [place]     Family History  Problem Relation Age of Onset  . Heart attack Mother     HTN  . Pneumonia Father   . Kidney failure Father   . Pancreatic cancer Sister   . Diabetes Brother   . Hypertension Brother   . Colon cancer Neg Hx   . Anesthesia problems Neg Hx   . Hypotension Neg Hx   . Malignant hyperthermia Neg Hx   . Pseudochol deficiency Neg Hx   . Stroke Maternal Grandmother   . Heart attack Maternal Grandfather   . Cancer Sister   . Hypertension Son    PE: BP 110/58  Pulse 92  Temp(Src) 98.6 F (37 C) (Oral)  Resp 12  Ht 5' (1.524 m)  Wt 172 lb 9.6 oz (78.291 kg)  BMI 33.71 kg/m2  SpO2 95% Wt Readings from Last 3 Encounters:  01/05/14 172 lb 9.6 oz (78.291 kg)  12/26/13 175 lb 12.8 oz (79.742 kg)  12/17/13 172 lb (78.019 kg)   Constitutional: overweight, in NAD Eyes: PERRLA, EOMI, no exophthalmos ENT: moist mucous membranes, no thyromegaly, large thyroid nodule felt in isthmus, no cervical lymphadenopathy Cardiovascular: RRR, No MRG Respiratory: CTA B Gastrointestinal: abdomen soft, NT, ND, BS+ Musculoskeletal: no deformities, strength intact in all 4 Skin: moist, warm, no rashes Neurological: no tremor with outstretched hands, DTR normal in all 4  ASSESSMENT: 1.  DM2, insulin-dependent, uncontrolled, without complications - gastroparesis - cerebrovasc. Ds -  H/o stroke - PN  2. Hypothyroidism  3. MNG  PLAN:  1. Patient with long-standing, uncontrolled diabetes, on oral antidiabetic regimen + basal-bolus regimen, which became insufficient.  - We discussed about options for treatment, and I suggested to:  Patient Instructions  Please stop Tradjenta. Continue: - Lantus 50 units at bedtime  Increase: -  Novolog to: 14 units with a smaller meal 17 units with a larger meal 20 units with a large meal Add: - Please start Metformin 500 mg with dinner x 4 days. If you tolerate this well, add another Metformin tablet (500 mg) with breakfast x 4 days. If you tolerate this well, add another metformin tablet with dinner (total 1000 mg) x 4 days. If you tolerate this well, add another metformin tablet with breakfast (total 1000 mg). Continue with 1000 mg of metformin 2x a day with breakfast and dinner.  Please return in 1 month with your sugar log.   - Strongly advised her to start checking sugars at different times of the day - check 3-4 times a day, rotating checks - given sugar log and advised how to fill it and to bring it at next appt  - given foot care handout and explained the principles  - given instructions for hypoglycemia management "15-15 rule"  - advised for yearly eye exams - had the flu vaccine this season - Return to clinic in 1 mo with sugar log   2. Hypothyroidism - no recent TFTs - we discussed how to take the thyroid hormone every day, with water, >30 minutes before breakfast, separated by >4 hours from acid reflux medications, calcium, iron, multivitamins. She will need to move Aciphex to lunchtime. - we will check her TFTs at next visit  3. MNG - previously had 2 normal Bx's (2014) - feels one nodule enlarging - will obtain a new U/S per her request. I will addend the results when they become available.  - time spent with the  patient: 1 hour, of which >50% was spent in obtaining information about her symptoms, reviewing her previous labs, evaluations, and treatments, counseling her about her conditions (please see the discussed topics above), and developing a plan to further investigate it. she had a number of questions which I addressed.  CLINICAL DATA: 63 year old female with low thyroid lab values.  EXAM: THYROID ULTRASOUND  TECHNIQUE: Ultrasound examination of the thyroid gland and adjacent soft tissues was performed.  COMPARISON: Biopsy study 10/29/2012, 10/11/2012  FINDINGS: Right thyroid lobe  Measurements: 4.4 cm x 1.2 cm x 1.7 cm. Multiple right-sided nodules identified.  Each right-sided nodule demonstrates increased echogenicity, with the superior measuring 7 mm -8 mm, most inferior measuring 9 mm - 10 mm. A small, 3 mm focus of calcium with posterior shadowing is evident. There is also a mid nodule measuring 7 mm, which is echogenic.  Left thyroid lobe  Measurements: 5.1 cm x 2.1 cm x 2.3 cm. Dominant lesion at the inferior pole of left thyroid is again evident, which has been previously biopsied (10/29/2012). This nodule measures 1.8 cm x 1.7 cm x 2.5 cm. (Previous 1.4 cm x 2.1 cm x 1.4 cm)  Isthmus  Thickness: 4 mm-5 mm. Isthmic nodule again noted, previously biopsied (10/29/2012). Currently this nodule measures 2.2 cm x 1.4 cm x 1.8 cm. (previous measurement 2.2 cm x 1.1 cm x 1.7 cm).  There is a new nodule identified within the isthmus with heterogeneously hyperechoic characteristics. This nodule measures 12 mm x 5.4 mm x 7.2 mm.  Lymphadenopathy  None visualized.  IMPRESSION: Multinodular goiter again evident.  The previously biopsied lesions at the inferior isthmus and the left lower thyroid have grown slightly in the interval.  A new hyperechoic nodule within the isthmus is identified on the current, which does not meet criteria for biopsy.  None of the right  thyroid lobe  lesions meet criteria for biopsy.  Follow-up by clinical exam is recommended. If patient has known risk factors for thyroid carcinoma, consider follow-up ultrasound in 12 months. If patient is clinically hyperthyroid, consider nuclear medicine thyroid uptake and scan.  Reference: Management of Thyroid Nodules Detected at [name]: Society of Radiologists in [place]. Radiology 2005; [id number].  Signed,  Dulcy Fanny. Earleen Newport, DO  [facility]  [facility]   Electronically Signed By: Corrie Mckusick D.O. On: 01/20/2014 19:23  Stable appearance of the nodules. Will let pt know.

## 2014-01-06 NOTE — Telephone Encounter (Signed)
No clear indication for stress test, unless she has prior documented CAD or new symptoms.  Dr. Lemmie Evens

## 2014-01-06 NOTE — Telephone Encounter (Signed)
Patient notified of advice per Dr. Debara Pickett

## 2014-01-08 ENCOUNTER — Telehealth: Payer: Self-pay | Admitting: Internal Medicine

## 2014-01-08 ENCOUNTER — Telehealth: Payer: Self-pay | Admitting: *Deleted

## 2014-01-08 NOTE — Telephone Encounter (Signed)
Patient would like to know if there is another alternative than the metformin  CC: Diarrhea, nausea, weak , headache,  and very tired   Blood sugar is still running in the 200's  Call back: [phone]   Please advise   Pharmacy: [facility]

## 2014-01-08 NOTE — Telephone Encounter (Signed)
Lamotrigine 100 mg #45 take 1 and 1/2 tablets am with 3 refills

## 2014-01-08 NOTE — Telephone Encounter (Signed)
Can we give an alternate to the metformin it is upsetting her IBS very badly

## 2014-01-08 NOTE — Telephone Encounter (Signed)
Please read note below and advise. Thank you.  

## 2014-01-08 NOTE — Telephone Encounter (Signed)
Megan, please advised her to: - Stop metformin - Increase Lantus to 60 units at bedtime - Increase Novolog to:  17 units with a smaller meal  20 units with a larger meal  23 units with a large meal  Call back if sugars not better at the beginning of next week.

## 2014-01-09 NOTE — Telephone Encounter (Signed)
Pt advised of Md's instructions below and voiced understanding.

## 2014-01-12 ENCOUNTER — Other Ambulatory Visit: Payer: Self-pay

## 2014-01-12 ENCOUNTER — Encounter: Payer: Self-pay | Admitting: Internal Medicine

## 2014-01-12 ENCOUNTER — Other Ambulatory Visit: Payer: Self-pay | Admitting: Gastroenterology

## 2014-01-12 NOTE — Telephone Encounter (Signed)
Case manager calling to see if there have been any recent changes so far.  Please followup on alternate for metformin.

## 2014-01-12 NOTE — Telephone Encounter (Signed)
[name], Let's try to resend the Metformin prescription as Metformmin XR if she agrees to try this. If not, she will need to increase the insulins further - let me know.

## 2014-01-12 NOTE — Telephone Encounter (Signed)
Please read notes below and advise.

## 2014-01-12 NOTE — Telephone Encounter (Signed)
#   Startex

## 2014-01-13 MED ORDER — DICYCLOMINE HCL 10 MG PO CAPS: 10.0000 mg | ORAL_CAPSULE | Freq: Three times a day (TID) | ORAL | Status: DC

## 2014-01-13 NOTE — Telephone Encounter (Signed)
Called the case manager, Nickie. Advised her per Dr Arman Filter note. She understood. She will discuss with pt. She has been pt's case Freight forwarder for 10 yrs. Pt is often non-compliant. She wanted [name] to be aware. She will call us back to advise if to send in the Metformin XR or increase her insulin.

## 2014-01-19 ENCOUNTER — Telehealth: Payer: Self-pay | Admitting: Family Medicine

## 2014-01-19 DIAGNOSIS — R432 Parageusia: Secondary | ICD-10-CM

## 2014-01-20 ENCOUNTER — Ambulatory Visit
Admission: RE | Admit: 2014-01-20 | Discharge: 2014-01-20 | Disposition: A | Discharge status: Home / Self Care | Payer: Commercial Managed Care - HMO | Source: Ambulatory Visit | Attending: Internal Medicine | Admitting: Internal Medicine

## 2014-01-20 ENCOUNTER — Other Ambulatory Visit (HOSPITAL_COMMUNITY): Payer: Self-pay | Admitting: Sports Medicine

## 2014-01-20 DIAGNOSIS — M25551 Pain in right hip: Secondary | ICD-10-CM

## 2014-01-21 NOTE — Telephone Encounter (Signed)
Please read note below and advise. Unsure due to the previous notes below. Thank you.

## 2014-01-21 NOTE — Telephone Encounter (Addendum)
Patient stated that [facility] want Dr Cruzita Lederer to call them about patient medications, she need a new prescriptions for Lantus, Novalog and pen needles. She also want to know the results of her lab work and MRI, Patient has more questions.  Please advise. [employer] Phone # [phone]

## 2014-01-21 NOTE — Telephone Encounter (Signed)
Yes she needs Dr [name] for this pls enter I will sign, let her know

## 2014-01-21 NOTE — Telephone Encounter (Signed)
Cannot smell or taste anything- this has been happening from time to time but now its been constant for the past week. Asked Dr Sherwood Gambler if it could be from her brain surgery, he did not know. Can she be referred to Dr [name] about this?

## 2014-01-22 ENCOUNTER — Telehealth: Payer: Self-pay | Admitting: *Deleted

## 2014-01-22 ENCOUNTER — Other Ambulatory Visit: Payer: Self-pay | Admitting: *Deleted

## 2014-01-22 MED ORDER — INSULIN ASPART 100 UNIT/ML FLEXPEN: 14.0000 [IU] | PEN_INJECTOR | Freq: Three times a day (TID) | SUBCUTANEOUS | Status: DC

## 2014-01-22 MED ORDER — GLUCOSE BLOOD VI STRP: ORAL_STRIP | Status: DC

## 2014-01-22 MED ORDER — INSULIN GLARGINE 100 UNIT/ML SOLOSTAR PEN: 50.0000 [IU] | PEN_INJECTOR | Freq: Every day | SUBCUTANEOUS | Status: DC

## 2014-01-22 MED ORDER — INSULIN PEN NEEDLE 31G X 6 MM MISC: Status: DC

## 2014-01-22 NOTE — Telephone Encounter (Signed)
Patient aware and referral entered   

## 2014-01-22 NOTE — Telephone Encounter (Signed)
Rx's sent to [facility]. [place] and cancelled the rx's accidentally sent to them.

## 2014-01-22 NOTE — Telephone Encounter (Signed)
Opened encounter in error  

## 2014-01-22 NOTE — Addendum Note (Signed)
Addended by: Eual Fines on: 01/22/2014 08:13 AM   Modules accepted: Orders

## 2014-01-22 NOTE — Telephone Encounter (Signed)
Contacted pt with results. Rx's sent to [facility] for pt.

## 2014-01-22 NOTE — Telephone Encounter (Signed)
[name], Please call [employer] and see what the pb is. Also, let's send the Rx's. I sent you the following message 2 days ago: Notes Recorded by Philemon Kingdom, MD on 01/20/2014 at 7:45 PM [name], can you please call pt: new thyroid U/S shows stable appearance of her thyroid nodules. No significant change, which is great! I did not order an MRI >> she has one pending but was not ordered by me.

## 2014-01-23 ENCOUNTER — Ambulatory Visit (HOSPITAL_COMMUNITY): Admission: RE | Admit: 2014-01-23 | Payer: Medicaid Other | Source: Ambulatory Visit

## 2014-01-26 ENCOUNTER — Other Ambulatory Visit (HOSPITAL_COMMUNITY): Payer: Self-pay | Admitting: "Endocrinology

## 2014-01-26 ENCOUNTER — Telehealth: Payer: Self-pay | Admitting: Internal Medicine

## 2014-01-26 DIAGNOSIS — E049 Nontoxic goiter, unspecified: Secondary | ICD-10-CM

## 2014-01-26 MED ORDER — METFORMIN HCL ER 500 MG PO TB24: 1000.0000 mg | ORAL_TABLET | Freq: Two times a day (BID) | ORAL | Status: DC

## 2014-01-26 NOTE — Telephone Encounter (Signed)
[name], the nurse case manager back. She said she spoke with pt and she is willing to try the Metformin XR to see if this helps. Also, she said pt has been doing 60 units of Lantus at bedtime instead of 50 units. Her fasting blood sugar was 167 this AM. Please advise.

## 2014-01-26 NOTE — Telephone Encounter (Signed)
NIcki nurse case manager calling regarding the pts meds please call her back at # [phone]

## 2014-01-26 NOTE — Telephone Encounter (Signed)
Please start Metformin XR 500 mg with dinner x 4 days. If you tolerate this well, add another Metformin tablet (500 mg) with breakfast x 4 days. If you tolerate this well, add another metformin tablet with dinner (total 1000 mg) x 4 days. If you tolerate this well, add another metformin tablet with breakfast (total 1000 mg). Continue with 1000 mg of metformin 2x a day with breakfast and dinner.  Please decrease the Lantus to 50 units if the sugars in a.m. Lower than 130 repeatedly after starting metformin XR.  Let's send 120 tabs of metformin XR to her pharmacy, refills#2.

## 2014-01-26 NOTE — Telephone Encounter (Signed)
Reece Agar, Nurse Case Manager and advised her per Dr Arman Filter note below. She understood and will educate pt. Rx sent to pt's pharmacy.

## 2014-01-27 ENCOUNTER — Encounter (HOSPITAL_COMMUNITY): Payer: Self-pay

## 2014-01-27 ENCOUNTER — Ambulatory Visit (HOSPITAL_COMMUNITY)
Admission: RE | Admit: 2014-01-27 | Discharge: 2014-01-27 | Disposition: A | Discharge status: Home / Self Care | Payer: Medicare HMO | Source: Ambulatory Visit | Attending: Sports Medicine | Admitting: Sports Medicine

## 2014-01-27 DIAGNOSIS — M25561 Pain in right knee: Secondary | ICD-10-CM | POA: Diagnosis not present

## 2014-01-27 DIAGNOSIS — M7601 Gluteal tendinitis, right hip: Secondary | ICD-10-CM | POA: Insufficient documentation

## 2014-01-27 DIAGNOSIS — M25551 Pain in right hip: Secondary | ICD-10-CM | POA: Diagnosis present

## 2014-01-28 ENCOUNTER — Telehealth: Payer: Self-pay | Admitting: Internal Medicine

## 2014-01-28 NOTE — Telephone Encounter (Signed)
Spoke with the pt- she is having a lot of constipation issues. She is having a bm almost every day but it is very hard and she is having to strain to get it out. Sometimes she has to put on a glove and manually get it out. She is eating a lot of fruits and vegetables and drinking a lot of water. She is taking 2 colace daily, benefiber bid, using otc suppositories and enemas prn and drinking prune juice. She has not seen any bright red blood but she said sometimes her stools are tarry but not every time. On 12/17/13 she had an ov with Dr.[name] and she was heme + at that visit per Dr.[name]'s ov note, but pt was also having diarrhea at that time. She said she cannot take amitiza or linzess because it causes fecal incontinence.   Pt stated she is still taking the bentyl bid, she was taking it ac and hs but that was changed at her last ov here.   Pt wants to know if there is anything we can do for her.

## 2014-01-28 NOTE — Telephone Encounter (Signed)
Patient called and states that she is having fluttering in her stomach and constipation.  Stomach pain after she eats, but is having constant constipation.  Please advise.  [phone]

## 2014-01-29 NOTE — Telephone Encounter (Signed)
SHOULD I SCHEDULE HER WITH AN EXTENDER IN AN URGENT SPOT?

## 2014-01-29 NOTE — Telephone Encounter (Signed)
Yes, please get pt an appt. Thanks.

## 2014-01-29 NOTE — Telephone Encounter (Signed)
Needs office visit.

## 2014-01-30 NOTE — Telephone Encounter (Signed)
Patient coming in 02/11/14

## 2014-02-04 ENCOUNTER — Other Ambulatory Visit: Payer: Self-pay | Admitting: Family Medicine

## 2014-02-08 IMAGING — CT CT CTA ABD/PEL W/CM AND/OR W/O CM
3 of 13 series · 11 of 46 positions shown, 17 images · IV contrast (Omnipaque 300)
Comparison: CT abdomen pelvis - 03/30/2010

CLINICAL DATA: Abdominal pain, weight loss, evaluate for
mesenteric ischemia

CT ANGIOGRAPHY ABDOMEN AND PELVIS
TECHNIQUE: Multidetector CT imaging of the abdomen and pelvis was
performed using the standard protocol during bolus administration
of intravenous contrast.  Multiplanar reconstructed images
including MIPs were obtained and reviewed to evaluate the vascular
anatomy.
Contrast: 100mL OMNIPAQUE IOHEXOL 350 MG/ML SOLN

[Series 5: arterial 3.0 b30f · axial · arterial · 0.68mm/px · z∈[-353,-299]mm · 2 of 135 slices shown]
[im 9/135  soft-tissue]
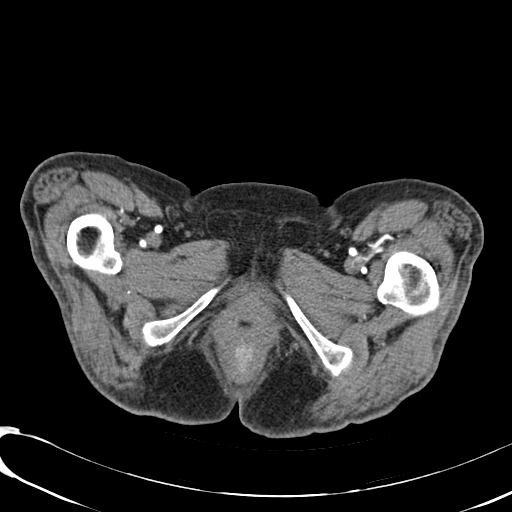
[im 27/135  soft-tissue]
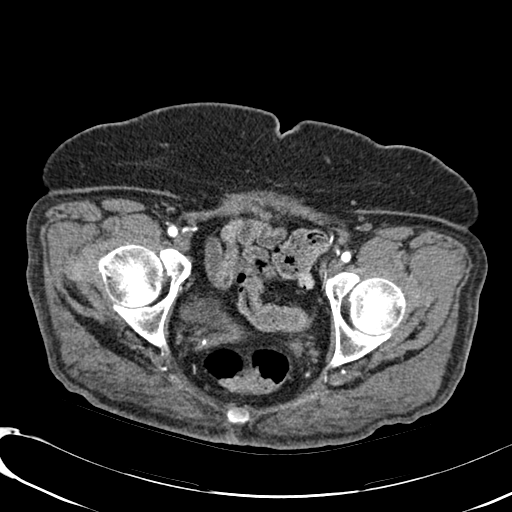

[Series 7: mpr cor post contrast · coronal · 0.67mm/px · 1 of 87 slices shown, 2 images]
[im 44/87  soft-tissue]
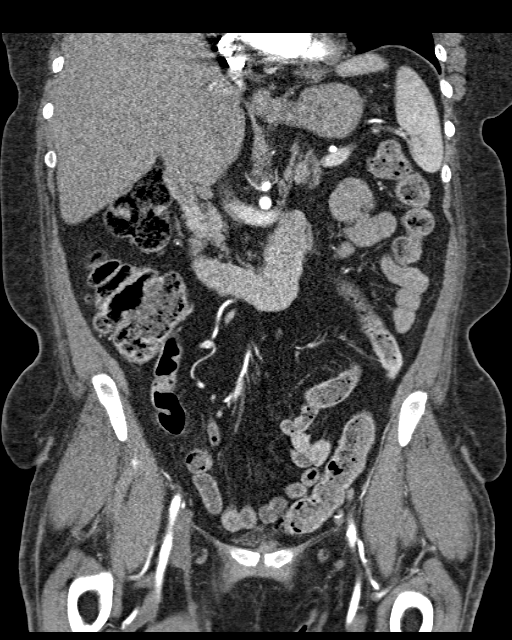
[im 44/87  bone]
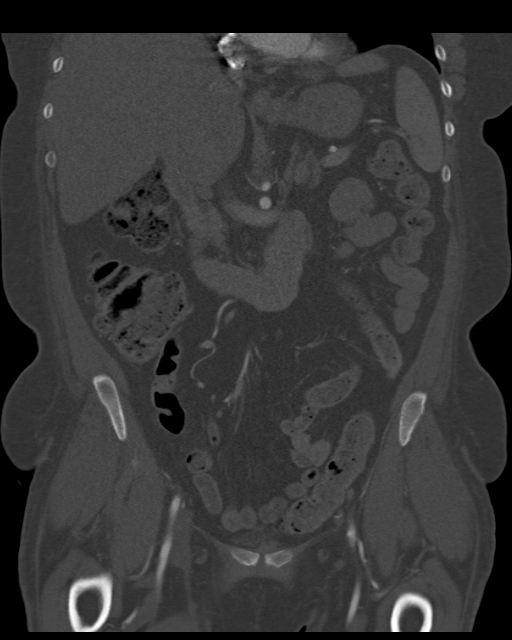

[Series 10: venous 5.0 b30f · axial · portal-venous · 0.68mm/px · z∈[-335,-20]mm · 8 of 81 slices shown, 13 images]
[im 9/81  soft-tissue]
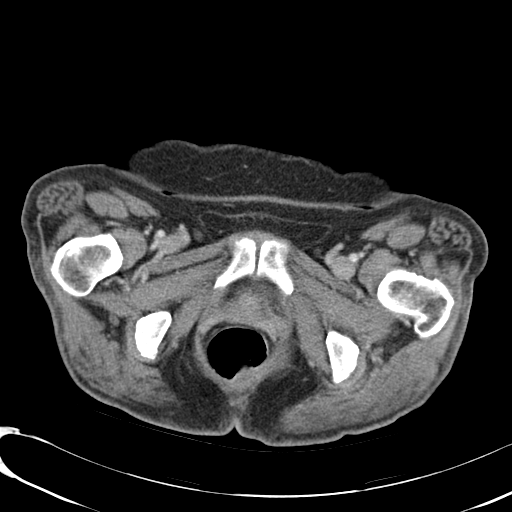
[im 9/81  bone]
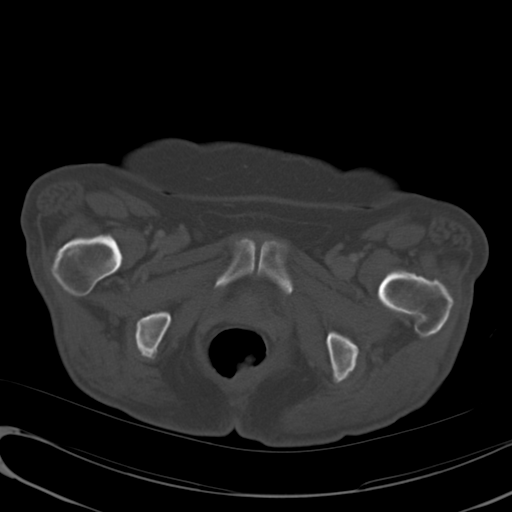
[im 18/81  soft-tissue]
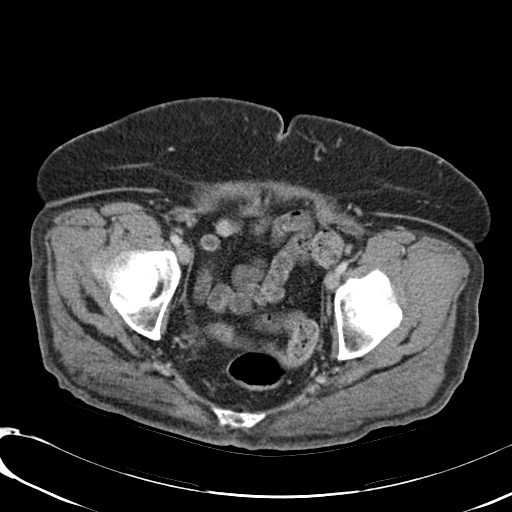
[im 27/81  soft-tissue]
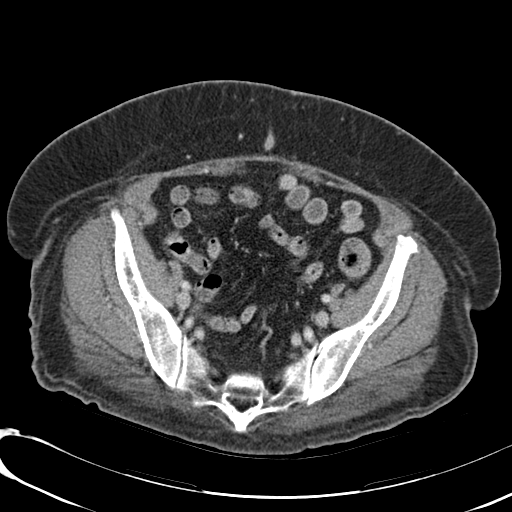
[im 36/81  soft-tissue]
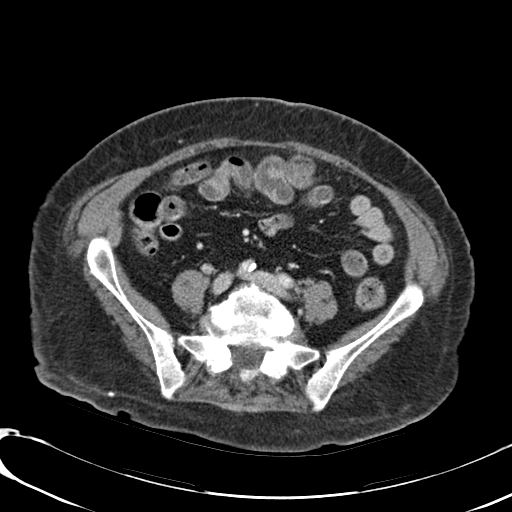
[im 45/81  soft-tissue]
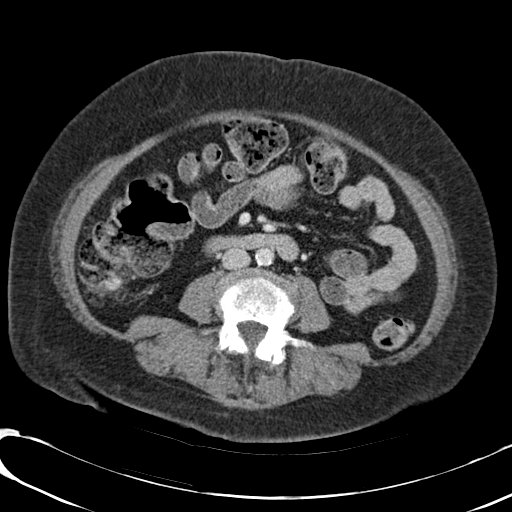
[im 45/81  lung]
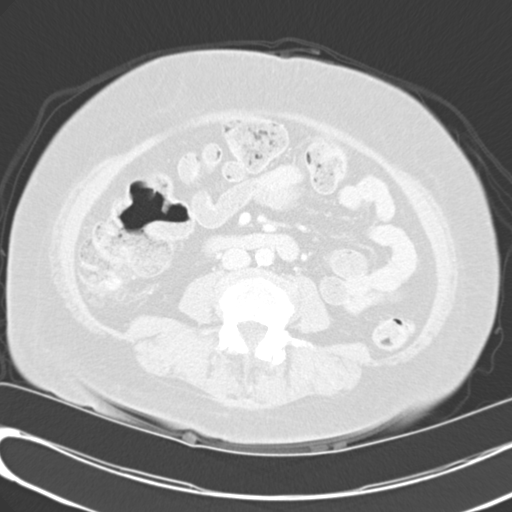
[im 54/81  soft-tissue]
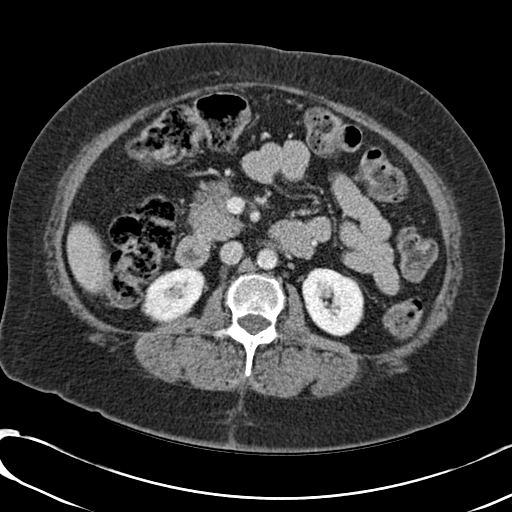
[im 54/81  lung]
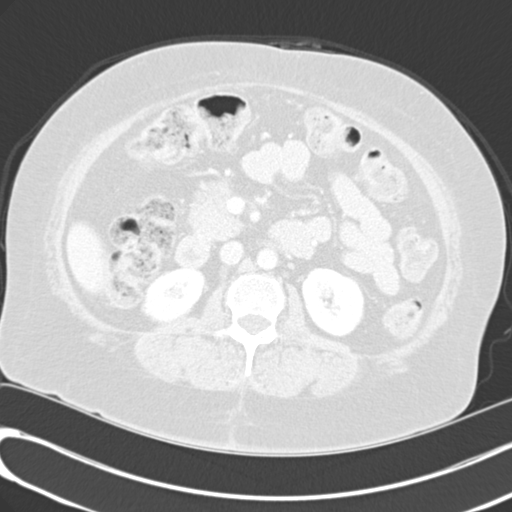
[im 63/81  soft-tissue]
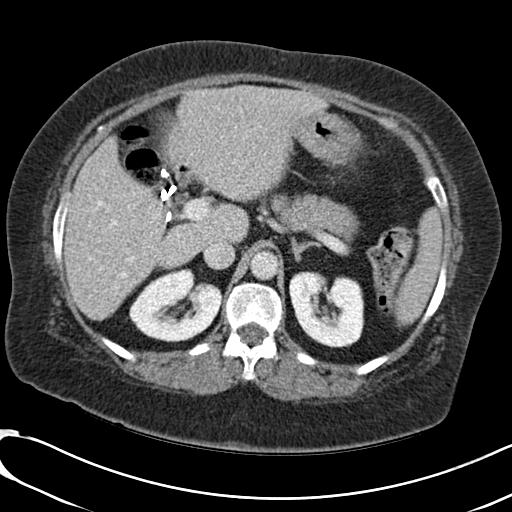
[im 63/81  lung]
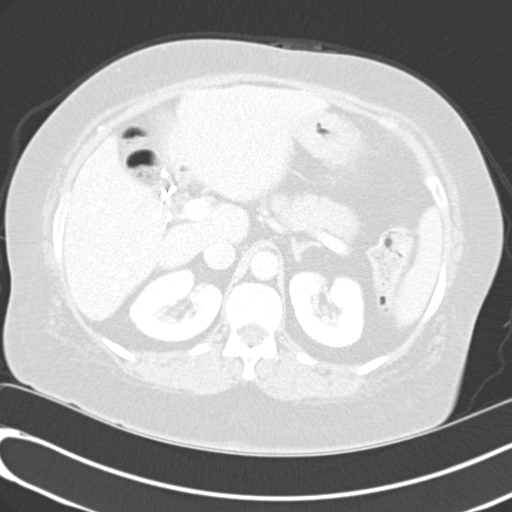
[im 72/81  soft-tissue]
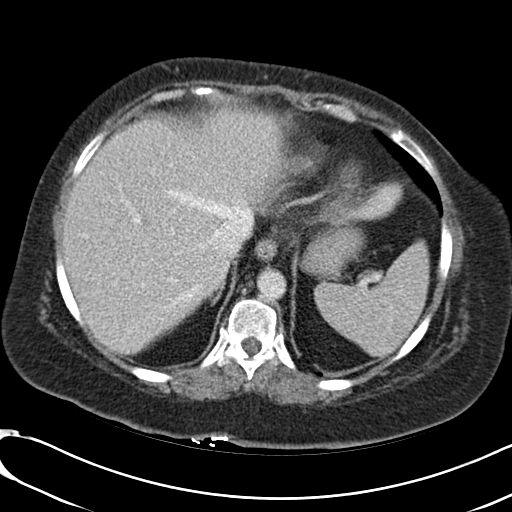
[im 72/81  lung]
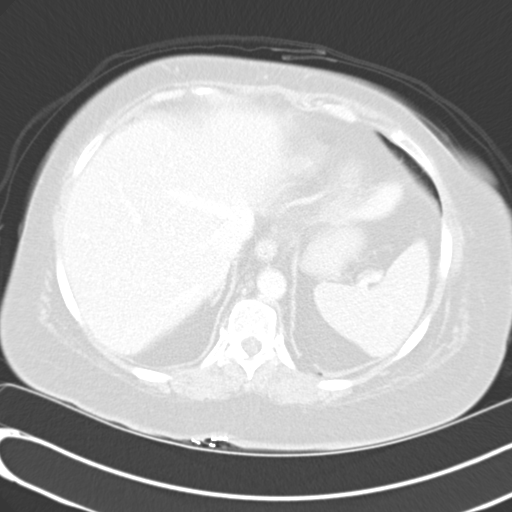

[11 of 46 positions shown; findings below may reference images not displayed]

Vascular Findings:

Abdominal aorta:  Scattered minimal atherosclerotic plaque within a
normal caliber abdominal aorta.  No abdominal aortic dissection or
periaortic stranding.

Celiac artery:  Widely patent without hemodynamically significant
stenosis.  Conventional branching pattern.

SMA:  Widely patent without hemodynamically significant stenosis.
Incidental note is made of a replaced right hepatic artery arising
from the proximal SMA.

Right renal artery:  Solitary; widely patent without
hemodynamically significant stenosis.

Left renal artery: Solitary; widely patent without hemodynamically
significant stenosis.

IMA:  Widely patent without hemodynamically significant stenosis.

Pelvic vasculature:  There is minimal eccentric atherosclerotic
plaque within the right common iliac artery, not resulting in
hemodynamically significant stenosis.  The remainder of the pelvic
vasculature is widely patent and of normal caliber bilaterally.

Review of the MIP images confirms the above findings.

-------------------------------------------------

Nonvascular findings:

Normal hepatic contour.  No discrete hepatic lesions.  Post
cholecystectomy.  No definite intrahepatic biliary duct dilatation.
No ascites.

There is symmetric enhancing excretion of the bilateral kidneys.
No definite renal stones on the post contrast examination.  No
discrete renal lesions or evidence of urinary obstruction.  No
perinephric stranding.  Normal appearance of the bilateral adrenal
glands, pancreas and spleen.

Scattered colonic diverticulosis without evidence of
diverticulitis.  Bowel is otherwise normal in course and caliber
without wall thickening or evidence of obstruction.  Moderate
colonic stool burden.  Normal appearance of the appendix.  No
pneumoperitoneum, pneumatosis or portal venous gas.

No definite retroperitoneal, mesenteric, pelvic or inguinal
lymphadenopathy.

Post hysterectomy.  No discrete adnexal lesion.  No free fluid in
the pelvis.

Limited visualization of the lower thorax demonstrates an
approximately 0.8 x 0.6 cm noncalcified nodule within the inferior
segment lingula, not deftly seen on prior examinations.  Linear
subsegmental atelectasis within the imaged left lower lobe.  No
focal airspace opacity.  No pleural effusion.

Normal heart size.  No pericardial effusion.

No acute or aggressive osseous abnormalities.  Mild multilevel
thoracolumbar spine degenerative change.
IMPRESSION: 1.  Scattered minimal atherosclerotic plaque within a normal
caliber abdominal aorta.  No evidence of mesenteric ischemia.
2.  Possible 8 mm noncalcified nodule within the inferior segment
of the lingula, not definitely seen on prior examinations.  If the
patient is at high risk for bronchogenic carcinoma, follow-up chest
CT at 3-6 months is recommended.  If the patient is at low risk for
bronchogenic carcinoma, follow-up chest CT at 6-12 months is
recommended.  This recommendation follows the consensus statement:
Guidelines for Management of Small Pulmonary Nodules Detected on CT
Scans: A Statement from the [HOSPITAL] as published in

## 2014-02-10 ENCOUNTER — Ambulatory Visit: Payer: Commercial Managed Care - HMO | Admitting: Internal Medicine

## 2014-02-11 ENCOUNTER — Telehealth: Payer: Self-pay | Admitting: Internal Medicine

## 2014-02-11 ENCOUNTER — Ambulatory Visit: Payer: Commercial Managed Care - HMO | Admitting: Gastroenterology

## 2014-02-11 ENCOUNTER — Encounter: Payer: Self-pay | Admitting: Gastroenterology

## 2014-02-11 NOTE — Telephone Encounter (Signed)
Pt is having diarrhea and vomiting and she does feel that it is from the metformin she has been on metformin before and had these same symptoms--please advise on alternate med

## 2014-02-11 NOTE — Telephone Encounter (Signed)
PATIENT WAS A NO SHOW 02/11/14 AND LETTER SENT

## 2014-02-11 NOTE — Telephone Encounter (Signed)
Please read note below and advise.  

## 2014-02-12 NOTE — Telephone Encounter (Signed)
OK, stop Metformin and increase Lantus back to 60 units.

## 2014-02-12 NOTE — Telephone Encounter (Signed)
Called pt and advised her per Dr Gherghe's note. Pt understood.  

## 2014-02-16 ENCOUNTER — Other Ambulatory Visit: Payer: Self-pay | Admitting: Family Medicine

## 2014-02-17 ENCOUNTER — Telehealth: Payer: Self-pay | Admitting: Family Medicine

## 2014-02-17 NOTE — Telephone Encounter (Signed)
Mailed to her

## 2014-02-18 ENCOUNTER — Ambulatory Visit (INDEPENDENT_AMBULATORY_CARE_PROVIDER_SITE_OTHER): Payer: Commercial Managed Care - HMO | Admitting: Gastroenterology

## 2014-02-18 ENCOUNTER — Encounter: Payer: Self-pay | Admitting: Gastroenterology

## 2014-02-18 VITALS — BP 153/77 | HR 90 | Temp 97.8°F | Ht 62.0 in | Wt 169.6 lb

## 2014-02-18 DIAGNOSIS — D509 Iron deficiency anemia, unspecified: Secondary | ICD-10-CM

## 2014-02-18 DIAGNOSIS — K589 Irritable bowel syndrome without diarrhea: Secondary | ICD-10-CM

## 2014-02-18 DIAGNOSIS — R143 Flatulence: Secondary | ICD-10-CM | POA: Insufficient documentation

## 2014-02-18 NOTE — Assessment & Plan Note (Signed)
Patient complains of constipation, described as difficulty passing bowel movement although bowel movements occur most days. Previously intolerant to Amitiza and Linzess due to fecal incontinence associated with urgency and loose bowel movements. She reports Hemoccult-positive stool recently although I do not have documentation of that. She has chronic microcytic anemia, hemoglobin slightly better a couple months ago. Complete workup including EGD, colonoscopy, Givens capsule study as previously outlined.  At this time I would like for her to stop Bentyl altogether as long she is having issues with constipation. Trial of Beano for gas. FODMAP table provided and patient asked to limit foods as shown. Ifobt. Further recommendations to follow.

## 2014-02-18 NOTE — Progress Notes (Signed)
Primary Care Physician: Tula Nakayama, MD  Primary Gastroenterologist:  Garfield Cornea, MD   Chief Complaint  Patient presents with  . Constipation  . Blood In Stools    HPI: [name] is a 63 y.o. female here for follow-up. She missed her appointment last week. She has recently called with complaints of constipation/straining requiring disimpaction at times. Per patient she states she was heme positive with [name] in 12/2013, was having diarrhea at that time. I am having trouble confirming this as physical exam on her last office visit with PCP reported heme-negative stool. Amitiza and Linzess previously led to fecal incontinence. She was last seen in the office in September 2015. Started metformin in 12/2013.   Having to strain to have BM. Having to use enemas at time. Last use was yesterday. "light grayish color" stool. Feels like she has to have BM a lot but then just passes gas. No longer on metformin due to nausea and abdominal cramps, diarrhea. Now back to constipation since off metformin. Trying to drink at least four 16 ounce bottles of water. Drinking warm prune juice twice daily. Tried tsp of MOM. Taking Bentyl BID right now, advised to drop back to twice daily at her last office visit with [name]. For most part has BM every day. No brbpr. No melena. No iron supplements. Complains of bloating. Benefiber not as effective. Complains of a lot of gas. Already tried probiotics and Gas-X.  Current Outpatient Prescriptions  Medication Sig Dispense Refill  . aspirin EC 81 MG tablet Take 81 mg by mouth daily.    . Cholecalciferol (D 5000) 5000 UNITS capsule Take 5,000 Units by mouth daily.    . cloNIDine (CATAPRES) 0.3 MG tablet TAKE 1 TABLET BY MOUTH EVERY EIGHT HOURS. ONCE AT 8AM, 4PM, AND 12 MIDNIGHT. 90 tablet 3  . diclofenac sodium (VOLTAREN) 1 % GEL Apply 2 g topically daily as needed (Pain).    [name] dicyclomine (BENTYL) 10 MG capsule Take 1 capsule (10 mg total) by mouth 4  (four) times daily -  before meals and at bedtime. As needed for cramps and diarrhea (Patient taking differently: Take 10 mg by mouth 2 (two) times daily as needed. As needed for cramps and diarrhea  Currently taking twice per day.) 120 capsule 5  . glucose blood (ACCU-CHEK AVIVA PLUS) test strip Use to test blood sugar 4 times daily as instructed. Dx code: E10.65 400 each 3  . hydrochlorothiazide (HYDRODIURIL) 25 MG tablet Take 1 tablet (25 mg total) by mouth daily. 30 tablet 3  . HYDROmorphone (DILAUDID) 2 MG tablet Take 1 tablet by mouth 3 (three) times daily.    . hydroxychloroquine (PLAQUENIL) 200 MG tablet Take 200 mg by mouth daily.    . insulin aspart (NOVOLOG FLEXPEN) 100 UNIT/ML FlexPen Inject 14-20 Units into the skin 3 (three) times daily with meals. 45 mL 1  . Insulin Glargine (LANTUS) 100 UNIT/ML Solostar Pen Inject 50 Units into the skin daily at 10 pm. 45 mL 1  . Insulin Pen Needle (UNIFINE PENTIPS) 31G X 6 MM MISC Use to inject insulin 4 times daily as directed. 390 each 3  . JANUVIA 25 MG tablet     . lamoTRIgine (LAMICTAL) 100 MG tablet Take 150 mg by mouth every morning.     [name] levothyroxine (LEVOXYL) 50 MCG tablet Take 50 mcg by mouth every morning.     [name] LORazepam (ATIVAN) 0.5 MG tablet Take 0.5 mg by mouth 2 (  two) times daily.     [name] losartan (COZAAR) 100 MG tablet TAKE 1 TABLET BY MOUTH ONCE DAILY. 30 tablet 3  . Magnesium Oxide 250 MG TABS Take 1 tablet (250 mg total) by mouth daily. 30 tablet 5  . methocarbamol (ROBAXIN) 500 MG tablet Take 500 mg by mouth daily.    . mirtazapine (REMERON) 30 MG tablet Take 30 mg by mouth at bedtime.     . mometasone-formoterol (DULERA) 100-5 MCG/ACT AERO Inhale 2 puffs into the lungs 2 (two) times daily. 1 Inhaler 0  . mometasone-formoterol (DULERA) 100-5 MCG/ACT AERO 2 puffs then rinse mouth, twice daily 1 Inhaler prn  . montelukast (SINGULAIR) 10 MG tablet TAKE ONE TABLET BY MOUTH ONCE DAILY. 30 tablet 2  . Multiple Vitamins-Minerals  (ONE-A-DAY 50 PLUS PO) Take 1 tablet by mouth daily.    . pregabalin (LYRICA) 75 MG capsule Take 75 mg by mouth 2 (two) times daily.     . RABEprazole (ACIPHEX) 20 MG tablet TAKE 1 TABLET BY MOUTH TWICE DAILY. 60 tablet 5  . RESTASIS 0.05 % ophthalmic emulsion     . rosuvastatin (CRESTOR) 5 MG tablet TAKE 1 TABLET BY MOUTH AT BEDTIME. 30 tablet 3  . sertraline (ZOLOFT) 100 MG tablet Take 100 mg by mouth at bedtime.     [name] spironolactone (ALDACTONE) 25 MG tablet TAKE ONE TABLET BY MOUTH DAILY. 30 tablet 3  . terbinafine (LAMISIL) 250 MG tablet Take 1 tablet by mouth daily.    [name] thioridazine (MELLARIL) 10 MG tablet Take 20 mg by mouth at bedtime.     . traZODone (DESYREL) 50 MG tablet Take 50 mg by mouth at bedtime.     [name] ZETIA 10 MG tablet TAKE ONE TABLET BY MOUTH ONCE DAILY. 30 tablet 2   No current facility-administered medications for this visit.    Allergies as of 02/18/2014 - Review Complete 01/05/2014  Allergen Reaction Noted  . Cephalexin Hives   . Iron Nausea And Vomiting 11/15/2012  . Milk-related compounds Other (See Comments) 07/29/2008  . Penicillins Hives   . Phenazopyridine hcl Other (See Comments) 06/19/2007   Past Medical History  Diagnosis Date  . Bipolar disorder   . CVA (cerebral infarction)   . Pancreatitis 2006    due to Depakote with normal EUS   . Osteoporosis   . Chronic back pain   . Diabetes mellitus   . Trigger finger   . Anxiety disorder   . Hypertension   . Migraines     chronic headaches  . Diverticulosis     TCS 9/08 by Dr. Delfin Edis for diarrhea . Bx for micro scopic colitis negative.   . Schatzki's ring     non critical / EGD with ED 8/2011with RMR  . S/P colonoscopy [id number], 2011    left-sided diverticula, hx of simple adenomas . 2011, random bx negative for microscopic colitis  . Glaucoma   . Allergic rhinitis   . Hypothyroidism     thyroid goiter  . Anemia   . Blood transfusion   . GERD (gastroesophageal reflux disease)   .  Stroke     left sided weakness  . Seizures     unknown etiology-on meds-last seizure was 3 yerars ago  . Anxiety   . Depression   . Metabolic encephalopathy 6/81/1572  . Sleep apnea     on CPAP  . Arthritis   . Gum symptoms     infection on antibiotic  . Chronic neck pain   .  Mononeuritis lower limb   . Frequent falls    Past Surgical History  Procedure Laterality Date  . Abdominal hysterectomy  1978  . Cholecystectomy  1984  . Ovarian cyst removal    . Carpal tunnel release Left 07/22/04    Dr. Aline Brochure  . Breast reduction surgery  1994  . Cataract extraction Bilateral   . Biopsy thyroid  2009  . Surgical excision of 3 tumors from right thigh and right buttock  and left upper thigh  2010  . Back surgery  July 2012  . Spine surgery  09/29/2010    Dr. Rolena Infante  . Maloney dilation  12/29/2010    RMR;  . Esophagogastroduodenoscopy  12/29/2010    Rourk-Retained food in the esophagus and stomach, small hiatal hernia, status post Maloney dilation of the esophagus  . Craniotomy  11/23/2011    Procedure: CRANIOTOMY TUMOR EXCISION;  Surgeon: Hosie Spangle, MD;  Location: [facility];  Service: Neurosurgery;  Laterality: N/A;  Craniotomy for tumor resection  . Brain surgery  11/2011    resection of meningioma  . Colonoscopy N/A 09/25/2012    [id number] diverticulosis.  colonic polyp-removed : tubular adenoma  . Esophagogastroduodenoscopy N/A 09/25/2012    [id number] atonic baggy esophagus status post Maloney dilation 70 F. Hiatal hernia  . Givens capsule study N/A 01/15/2013    NORMAL.   . Bacterial overgrowth test N/A 05/05/2013    Procedure: BACTERIAL OVERGROWTH TEST;  Surgeon: Daneil Dolin, MD;  Location: [facility];  Service: Endoscopy;  Laterality: N/A;  7:30  . Transthoracic echocardiogram  2010    EF 60-65%, mild conc LVH, grade 1 diastolic dysfunction; mildly calcified MV annulus with mildly thickened leaflets, mildly calcified MR annulus  . Nm myocar perf wall  motion  2006    "relavtiely normal" persantine, mild anterior thinning (breast attenuation artifact), no region of scar/ischemia  . Cardiac catheterization  05/10/2005    normal coronaries, normal LV systolic function and EF (Dr. Jackie Plum)    ROS:  General: Negative for anorexia, weight loss, fever, chills, fatigue, weakness. ENT: Negative for hoarseness, difficulty swallowing , nasal congestion. CV: Negative for chest pain, angina, palpitations, dyspnea on exertion, peripheral edema.  Respiratory: Negative for dyspnea at rest, dyspnea on exertion, cough, sputum, wheezing.  GI: See history of present illness. GU:  Negative for dysuria, hematuria, urinary incontinence, urinary frequency, nocturnal urination.  Endo: Negative for unusual weight change.    Physical Examination:   BP 153/77 mmHg  Pulse 90  Temp(Src) 97.8 F (36.6 C)  Ht 5\' 2"  (1.575 m)  Wt 169 lb 9.6 oz (76.93 kg)  BMI 31.01 kg/m2  General: Well-nourished, well-developed in no acute distress.  Eyes: No icterus. Mouth: Oropharyngeal mucosa moist and pink , no lesions erythema or exudate. Lungs: Clear to auscultation bilaterally.  Heart: Regular rate and rhythm, no murmurs rubs or gallops.  Abdomen: Bowel sounds are normal, nontender, nondistended, no hepatosplenomegaly or masses, no abdominal bruits or hernia , no rebound or guarding.   Extremities: No lower extremity edema. No clubbing or deformities. Neuro: Alert and oriented x 4   Skin: Warm and dry, no jaundice.   Psych: Alert and cooperative, normal mood and affect.  Labs:  Lab Results  Component Value Date   CREATININE 1.04 12/19/2013   BUN 23 12/19/2013   NA 141 12/19/2013   K 3.7 12/19/2013   CL 106 12/19/2013   CO2 26 12/19/2013   Lab Results  Component Value Date  ALT 18 12/19/2013   AST 18 12/19/2013   ALKPHOS 117 12/19/2013   BILITOT 0.4 12/19/2013   Lab Results  Component Value Date   WBC 7.2 12/19/2013   HGB 11.0* 12/19/2013   HCT  36.1 12/19/2013   MCV 72.9* 12/19/2013   PLT 237 12/19/2013   Lab Results  Component Value Date   IRON 105 12/19/2013   TIBC 422 12/19/2013   FERRITIN 48 12/19/2013

## 2014-02-18 NOTE — Addendum Note (Signed)
Addended by: Mahala Menghini on: 02/18/2014 03:13 PM   Modules accepted: Orders, Medications

## 2014-02-18 NOTE — Patient Instructions (Signed)
1. Stop Bentyl for now while you are experiencing issues with constipation.  2. Collect stool specimen and have labs done. 3. Please review the FODMAP table provided and try to LIMIT foods that are on the table to see if this helps your gas. 4. Try Beano as per packaging label.

## 2014-02-18 NOTE — Progress Notes (Signed)
cc'ed to pcp °

## 2014-02-19 ENCOUNTER — Other Ambulatory Visit: Payer: Self-pay

## 2014-02-19 ENCOUNTER — Ambulatory Visit (INDEPENDENT_AMBULATORY_CARE_PROVIDER_SITE_OTHER): Payer: Commercial Managed Care - HMO

## 2014-02-19 DIAGNOSIS — K589 Irritable bowel syndrome without diarrhea: Secondary | ICD-10-CM

## 2014-02-19 DIAGNOSIS — R143 Flatulence: Secondary | ICD-10-CM

## 2014-02-19 DIAGNOSIS — D649 Anemia, unspecified: Secondary | ICD-10-CM

## 2014-02-19 LAB — IFOBT (OCCULT BLOOD): IMMUNOLOGICAL FECAL OCCULT BLOOD TEST: NEGATIVE

## 2014-02-19 NOTE — Progress Notes (Signed)
IFOBT test result was Negative

## 2014-02-23 ENCOUNTER — Encounter: Payer: Self-pay | Admitting: Neurology

## 2014-02-23 ENCOUNTER — Ambulatory Visit (INDEPENDENT_AMBULATORY_CARE_PROVIDER_SITE_OTHER): Payer: Commercial Managed Care - HMO | Admitting: Neurology

## 2014-02-23 VITALS — BP 120/58 | HR 60 | Temp 98.7°F | Resp 16 | Ht 59.0 in | Wt 169.5 lb

## 2014-02-23 DIAGNOSIS — G4710 Hypersomnia, unspecified: Secondary | ICD-10-CM

## 2014-02-23 DIAGNOSIS — G8191 Hemiplegia, unspecified affecting right dominant side: Secondary | ICD-10-CM

## 2014-02-23 DIAGNOSIS — G40909 Epilepsy, unspecified, not intractable, without status epilepticus: Secondary | ICD-10-CM

## 2014-02-23 DIAGNOSIS — Z8603 Personal history of neoplasm of uncertain behavior: Secondary | ICD-10-CM

## 2014-02-23 DIAGNOSIS — I69359 Hemiplegia and hemiparesis following cerebral infarction affecting unspecified side: Secondary | ICD-10-CM

## 2014-02-23 DIAGNOSIS — Z79899 Other long term (current) drug therapy: Secondary | ICD-10-CM

## 2014-02-23 DIAGNOSIS — R413 Other amnesia: Secondary | ICD-10-CM

## 2014-02-23 DIAGNOSIS — Z9889 Other specified postprocedural states: Secondary | ICD-10-CM

## 2014-02-23 DIAGNOSIS — Z720 Tobacco use: Secondary | ICD-10-CM

## 2014-02-23 DIAGNOSIS — IMO0002 Reserved for concepts with insufficient information to code with codable children: Secondary | ICD-10-CM

## 2014-02-23 MED ORDER — LAMOTRIGINE 100 MG PO TABS: 150.0000 mg | ORAL_TABLET | Freq: Every morning | ORAL | Status: DC

## 2014-02-23 NOTE — Progress Notes (Signed)
NEUROLOGY FOLLOW UP OFFICE NOTE  [name] [id number]  HISTORY OF PRESENT ILLNESS: [name] is a 63 year old right-handed woman with hypertension, stroke, type II diabetes, , seizure disorder, mononeuritis of leg, OSA, thyroid nodule, cervical disc disease, depression, anxiety, chronic pain, chronic back pain with gait instability and falls, polypharmacy, and history of stroke with residual left-sided weakness and meningioma resection in September 2013 who follows up for chronic headaches, seizures, and meningioma.   UPDATE: Due to experiencing episodes of transient speech disturbance, she had an EEG performed on 12/23/13, which was normal.  No new issues.  No seizures.  HEADACHES: She has longstanding history of multifactorial chronic headaches.  They are described as from the back of her head and top of the front of her head.  It is a pounding pain, 8/10.  There is some nausea, but no photophobia or phonophobia.  She has blurred vision, but that is constant.  They usually last all day and occur 2 to 3 times a week.  She usually takes Tylenol for it, but she also is taking oxycodone for left arm pain.  Prior medications included lamotrigine, gabapentin, Lyrica, metoprolol, amitriptyline, and imipramine.  She is not a candidate for triptans due to history of stroke.  Given the complexity of the headaches, such as medication-overuse, and use of opioids daiIy recommended management by a headache specialist.  She saw Dr. Domingo Cocking at the [facility] on 04/07/13.  She received round of trigger point injections.  Dr. Domingo Cocking recommended Botox but she still does not want to pursue this.  She has had reduced frequency of headaches, however, to 1-2 times a month.  SEIZURES: She has history of seizures for many years.  Her last seizure was about 3 years ago.  She is taking Lamictal 100mg  daily.  Her EEG from 08/08/12 revealed right temporal epileptiform discharges.    Update:  No seizures.   Lamictal level from 12/08/13 was 6.7.  MENINGIOMA: She had a known history of meningioma.  She presented to the ED in August 2013 with sudden onset numbness of the feet and left hand weakness and numbness.  MRI of brain noted enlargement of previous planum sphenoidale meningioma compared to prior study in 2009, with vasogenic edema.  In September 2013, she underwent a bicoronal craniotomy and gross total resection.  Repeat MRI of brain with and without contrast performed on 05/01/12 revealed postop resection of meningioma of the planum sphenoidale with small amount of residual enhancing tissue along the planum sphenoidale (5 x 44mm), postop blood in the bilateral frontal lobes and bilateral encephalomalacia of the gyrus rectus bilaterally.  She had a repeat MRI of the brain with and without contrast performed on 06/18/13, which revealed no recurrent tumor.  STROKE: She has a history of stroke going back several years ago.  Semiology is unclear but reports left-sided issues.  She was started on Plavix, but last visit, she was switched over to ASA 81mg .  LDL from 08/06/13 98.  Hgb A1c from 10/28/13 was 8.5.   MEMORY: She also reports problems with memory, word-finding difficulties, and problems with name recognition of acquaintances of people at church.  This has been going on for over a year.  MMSE score from 12/08/13 was 27/30 with minor inconsistencies   Update:  Memory is still a problem.  OTHER HISTORY: She reports that at times, when she speaks, she doesn't make sense.  Her words or intelligible but content is confusing.  It may be  accompanied by slurred speech.  Both herself and others notice it.  It has been going on for a while but she notices it more over the past month or two.  She has history of cervical disc disease.  Recent MRI of cervical spine revealed shallow central disc protrusion at C3-4, degenerative disc disease and facet disease at C5-6 with a central disc protrusion, and shallow left  paracentral and medial foraminal disc protrusion at C7-T1.  Dr. Sherwood Gambler, her neurosurgeon, did not think she was a surgical candidate.  She also has history of OSA.  Currently on CPAP and is compliant.  She still has hypersomnolence.  She had a NPSG on 06/29/13, which revealed mild OSA, AHI 8.4/ hr, REM only 5.3% of TST.  MSLT performed on 06/30/13 pathological sleepiness, with mean latency 0.8%, No REM naps. The results were compromised because the night before, she had taken trazodone, mellaril, Lyrica, and mirtazapine.  She says somebody told her she has narcolepsy, but the sleep study does not suggest this.  She is on multiple medications for many chronic conditions as listed above, including, Norco, lorazepam, meclizine, mirtazapine, methocarbamol, Lyrica, gabapentin, Zoloft, phenergan, Lamictal, levothyroxine and naproxen.  She has been hospitalized on occasion for altered mental status.    PAST MEDICAL HISTORY: Past Medical History  Diagnosis Date  . Bipolar disorder   . CVA (cerebral infarction)   . Pancreatitis 2006    due to Depakote with normal EUS   . Osteoporosis   . Chronic back pain   . Diabetes mellitus   . Trigger finger   . Anxiety disorder   . Hypertension   . Migraines     chronic headaches  . Diverticulosis     TCS 9/08 by Dr. Delfin Edis for diarrhea . Bx for micro scopic colitis negative.   . Schatzki's ring     non critical / EGD with ED 8/2011with RMR  . S/P colonoscopy [id number], 2011    left-sided diverticula, hx of simple adenomas . 2011, random bx negative for microscopic colitis  . Glaucoma   . Allergic rhinitis   . Hypothyroidism     thyroid goiter  . Anemia   . Blood transfusion   . GERD (gastroesophageal reflux disease)   . Stroke     left sided weakness  . Seizures     unknown etiology-on meds-last seizure was 3 yerars ago  . Anxiety   . Depression   . Metabolic encephalopathy 06/06/7122  . Sleep apnea     on CPAP  . Arthritis   . Gum  symptoms     infection on antibiotic  . Chronic neck pain   . Mononeuritis lower limb   . Frequent falls     MEDICATIONS: Current Outpatient Prescriptions on File Prior to Visit  Medication Sig Dispense Refill  . aspirin EC 81 MG tablet Take 81 mg by mouth daily.    . Cholecalciferol (D 5000) 5000 UNITS capsule Take 5,000 Units by mouth daily.    . cloNIDine (CATAPRES) 0.3 MG tablet TAKE 1 TABLET BY MOUTH EVERY EIGHT HOURS. ONCE AT 8AM, 4PM, AND 12 MIDNIGHT. 90 tablet 3  . diclofenac sodium (VOLTAREN) 1 % GEL Apply 2 g topically daily as needed (Pain).    [name] glucose blood (ACCU-CHEK AVIVA PLUS) test strip Use to test blood sugar 4 times daily as instructed. Dx code: E10.65 400 each 3  . hydrochlorothiazide (HYDRODIURIL) 25 MG tablet Take 1 tablet (25 mg total) by mouth daily. 30 tablet 3  .  HYDROmorphone (DILAUDID) 2 MG tablet Take 1 tablet by mouth 3 (three) times daily.    . insulin aspart (NOVOLOG FLEXPEN) 100 UNIT/ML FlexPen Inject 14-20 Units into the skin 3 (three) times daily with meals. 45 mL 1  . Insulin Glargine (LANTUS) 100 UNIT/ML Solostar Pen Inject 50 Units into the skin daily at 10 pm. 45 mL 1  . Insulin Pen Needle (UNIFINE PENTIPS) 31G X 6 MM MISC Use to inject insulin 4 times daily as directed. 390 each 3  . JANUVIA 25 MG tablet     . levothyroxine (LEVOXYL) 50 MCG tablet Take 50 mcg by mouth every morning.     [name] LORazepam (ATIVAN) 0.5 MG tablet Take 0.5 mg by mouth 2 (two) times daily.     [name] losartan (COZAAR) 100 MG tablet TAKE 1 TABLET BY MOUTH ONCE DAILY. 30 tablet 3  . Magnesium Oxide 250 MG TABS Take 1 tablet (250 mg total) by mouth daily. 30 tablet 5  . methocarbamol (ROBAXIN) 500 MG tablet Take 500 mg by mouth daily.    . mirtazapine (REMERON) 30 MG tablet Take 30 mg by mouth at bedtime.     . mometasone-formoterol (DULERA) 100-5 MCG/ACT AERO Inhale 2 puffs into the lungs 2 (two) times daily. 1 Inhaler 0  . mometasone-formoterol (DULERA) 100-5 MCG/ACT AERO 2 puffs  then rinse mouth, twice daily 1 Inhaler prn  . montelukast (SINGULAIR) 10 MG tablet TAKE ONE TABLET BY MOUTH ONCE DAILY. 30 tablet 2  . Multiple Vitamins-Minerals (ONE-A-DAY 50 PLUS PO) Take 1 tablet by mouth daily.    . pregabalin (LYRICA) 75 MG capsule Take 75 mg by mouth 2 (two) times daily.     . RABEprazole (ACIPHEX) 20 MG tablet TAKE 1 TABLET BY MOUTH TWICE DAILY. 60 tablet 5  . RESTASIS 0.05 % ophthalmic emulsion     . rosuvastatin (CRESTOR) 5 MG tablet TAKE 1 TABLET BY MOUTH AT BEDTIME. 30 tablet 3  . sertraline (ZOLOFT) 100 MG tablet Take 100 mg by mouth at bedtime.     [name] spironolactone (ALDACTONE) 25 MG tablet TAKE ONE TABLET BY MOUTH DAILY. 30 tablet 3  . terbinafine (LAMISIL) 250 MG tablet Take 1 tablet by mouth daily.    [name] thioridazine (MELLARIL) 10 MG tablet Take 20 mg by mouth at bedtime.     . traZODone (DESYREL) 50 MG tablet Take 50 mg by mouth at bedtime.     [name] ZETIA 10 MG tablet TAKE ONE TABLET BY MOUTH ONCE DAILY. 30 tablet 2  . hydroxychloroquine (PLAQUENIL) 200 MG tablet Take 200 mg by mouth daily.     No current facility-administered medications on file prior to visit.    ALLERGIES: Allergies  Allergen Reactions  . Cephalexin Hives  . Iron Nausea And Vomiting  . Milk-Related Compounds Other (See Comments)    Doesn't agree with stomach.   . Penicillins Hives  . Phenazopyridine Hcl Other (See Comments)    [place]      FAMILY HISTORY: Family History  Problem Relation Age of Onset  . Heart attack Mother     HTN  . Pneumonia Father   . Kidney failure Father   . Pancreatic cancer Sister   . Diabetes Brother   . Hypertension Brother   . Colon cancer Neg Hx   . Anesthesia problems Neg Hx   . Hypotension Neg Hx   . Malignant hyperthermia Neg Hx   . Pseudochol deficiency Neg Hx   . Stroke Maternal Grandmother   . Heart  attack Maternal Grandfather   . Cancer Sister   . Hypertension Son     SOCIAL HISTORY: History   Social History  . Marital Status:  Divorced    Spouse Name: N/A    Number of Children: 1  . Years of Education: 12   Occupational History  . disabled     Social History Main Topics  . Smoking status: Current Every Day Smoker -- 0.25 packs/day for 7 years    Types: Cigarettes  . Smokeless tobacco: Never Used     Comment: "started back but off now for 3 months" (08/18/13)  . Alcohol Use: No     Comment:    . Drug Use: No  . Sexual Activity: No   Other Topics Concern  . Not on file   Social History Narrative    REVIEW OF SYSTEMS: Constitutional: Somnolent Eyes: No visual changes, double vision, eye pain Ear, nose and throat: No hearing loss, ear pain, nasal congestion, sore throat Cardiovascular: No chest pain, palpitations Respiratory:  No shortness of breath at rest or with exertion, wheezes GastrointestinaI: No nausea, vomiting, diarrhea, abdominal pain, fecal incontinence Genitourinary:  No dysuria, urinary retention or frequency Musculoskeletal:  No neck pain, back pain Integumentary: No rash, pruritus, skin lesions Neurological: as above Psychiatric: No depression, insomnia, anxiety Endocrine: No palpitations, fatigue, diaphoresis, mood swings, change in appetite, change in weight, increased thirst Hematologic/Lymphatic:  No anemia, purpura, petechiae. Allergic/Immunologic: no itchy/runny eyes, nasal congestion, recent allergic reactions, rashes  PHYSICAL EXAM: Filed Vitals:   02/23/14 1420  BP: 120/58  Pulse: 60  Temp: 98.7 F (37.1 C)  Resp: 16   General: No acute distress Head:  Normocephalic/atraumatic Eyes:  Fundoscopic exam unremarkable without vessel changes, exudates, hemorrhages or papilledema. Neck: supple, no paraspinal tenderness, full range of motion Heart:  Regular rate and rhythm Lungs:  Clear to auscultation bilaterally Back: No paraspinal tenderness Neurological Exam: Somnolent but easily alerts and oriented to person, place, and time. Attention span and concentration intact,  recent and remote memory intact, fund of knowledge intact.  Speech slowed and dysarthric, language intact. CN II-XII intact. Fundi not visualized. Bulk and tone normal. 5-/5 left hip flexion, otherwise 5/5.  Reduced pinprick sensation to left arm and leg. DTR 1+ throughout, except absent at ankles. Finger to nose slowed but no dysmetria. Wide-based gait.    IMPRESSION: Seizure disorder, stable Left-sided hemiplegia, post stroke Tobacco abuse Hypersomnolence Polypharmacy Memory deficits, likely related to hypersomnolence and polypharmacy Migraines stable  PLAN: Continue Lamictal 150mg  daily Try to reduce medication list Smoking cessation ASA for secondary stroke prevention Follow up in 3 months  Metta Clines, DO  CC:  Tula Nakayama, MD

## 2014-02-23 NOTE — Patient Instructions (Signed)
1.  Continue Lamictal 150mg  daily 2.  Discuss with [name] regarding excessive sleepiness 3.  Follow up in 3 months.

## 2014-02-25 DIAGNOSIS — N189 Chronic kidney disease, unspecified: Secondary | ICD-10-CM

## 2014-02-25 DIAGNOSIS — F319 Bipolar disorder, unspecified: Secondary | ICD-10-CM

## 2014-02-25 DIAGNOSIS — I129 Hypertensive chronic kidney disease with stage 1 through stage 4 chronic kidney disease, or unspecified chronic kidney disease: Secondary | ICD-10-CM

## 2014-02-25 DIAGNOSIS — E119 Type 2 diabetes mellitus without complications: Secondary | ICD-10-CM

## 2014-03-03 ENCOUNTER — Emergency Department (HOSPITAL_COMMUNITY)
Admission: EM | Admit: 2014-03-03 | Discharge: 2014-03-04 | Disposition: A | Discharge status: Home / Self Care | Payer: Commercial Managed Care - HMO | Attending: Emergency Medicine | Admitting: Emergency Medicine

## 2014-03-03 ENCOUNTER — Encounter (HOSPITAL_COMMUNITY): Payer: Self-pay | Admitting: Emergency Medicine

## 2014-03-03 DIAGNOSIS — E039 Hypothyroidism, unspecified: Secondary | ICD-10-CM | POA: Insufficient documentation

## 2014-03-03 DIAGNOSIS — H409 Unspecified glaucoma: Secondary | ICD-10-CM | POA: Insufficient documentation

## 2014-03-03 DIAGNOSIS — Z7982 Long term (current) use of aspirin: Secondary | ICD-10-CM | POA: Diagnosis not present

## 2014-03-03 DIAGNOSIS — Q394 Esophageal web: Secondary | ICD-10-CM | POA: Insufficient documentation

## 2014-03-03 DIAGNOSIS — F419 Anxiety disorder, unspecified: Secondary | ICD-10-CM | POA: Insufficient documentation

## 2014-03-03 DIAGNOSIS — G8929 Other chronic pain: Secondary | ICD-10-CM | POA: Insufficient documentation

## 2014-03-03 DIAGNOSIS — M1990 Unspecified osteoarthritis, unspecified site: Secondary | ICD-10-CM | POA: Diagnosis not present

## 2014-03-03 DIAGNOSIS — F319 Bipolar disorder, unspecified: Secondary | ICD-10-CM | POA: Diagnosis not present

## 2014-03-03 DIAGNOSIS — Z9889 Other specified postprocedural states: Secondary | ICD-10-CM | POA: Diagnosis not present

## 2014-03-03 DIAGNOSIS — G43909 Migraine, unspecified, not intractable, without status migrainosus: Secondary | ICD-10-CM | POA: Diagnosis not present

## 2014-03-03 DIAGNOSIS — Z880 Allergy status to penicillin: Secondary | ICD-10-CM | POA: Diagnosis not present

## 2014-03-03 DIAGNOSIS — Z79899 Other long term (current) drug therapy: Secondary | ICD-10-CM | POA: Diagnosis not present

## 2014-03-03 DIAGNOSIS — K219 Gastro-esophageal reflux disease without esophagitis: Secondary | ICD-10-CM | POA: Insufficient documentation

## 2014-03-03 DIAGNOSIS — E119 Type 2 diabetes mellitus without complications: Secondary | ICD-10-CM | POA: Diagnosis not present

## 2014-03-03 DIAGNOSIS — Z794 Long term (current) use of insulin: Secondary | ICD-10-CM | POA: Diagnosis not present

## 2014-03-03 DIAGNOSIS — I10 Essential (primary) hypertension: Secondary | ICD-10-CM | POA: Diagnosis not present

## 2014-03-03 DIAGNOSIS — Z8673 Personal history of transient ischemic attack (TIA), and cerebral infarction without residual deficits: Secondary | ICD-10-CM | POA: Diagnosis not present

## 2014-03-03 DIAGNOSIS — Z720 Tobacco use: Secondary | ICD-10-CM | POA: Insufficient documentation

## 2014-03-03 LAB — CBG MONITORING, ED: Glucose-Capillary: 124 mg/dL — ABNORMAL HIGH (ref 70–99)

## 2014-03-03 MED ORDER — CLONIDINE HCL 0.2 MG PO TABS
0.3000 mg | ORAL_TABLET | Freq: Once | ORAL | Status: AC
  Administered 2014-03-03: 0.3 mg via ORAL
  Filled 2014-03-03 (×2): qty 1

## 2014-03-03 NOTE — ED Provider Notes (Signed)
CSN: [id number]     Arrival date & time 03/03/14  2005 History  This chart was scribed for Delora Fuel, MD by Hilda Lias, ED Scribe. This patient was seen in room [facility]18[facility]18 and the patient's care was started at 11:42 PM.    Chief Complaint  Patient presents with  . Hypertension    The history is provided by the patient. No language interpreter was used.     HPI Comments:  [name] is a 63 y.o. female with a Hx of Stroke who presents to the Emergency Department complaining of HTN with associated headache and numbness that occurred 4 hours PTA when she became excited about something. Pt states that her BP at the time was 195/90. Pt states that she called her PCP when this occurred and was told to come get checked out in the ED. Pt states that after her BP spiked she began to have a headache and a numbing, tingling sensation in her head and face. Pt has had a stroke before, and states that the left side of her body was the side affected. Pt states that her left side is weaker than her right.     Past Medical History  Diagnosis Date  . Bipolar disorder   . CVA (cerebral infarction)   . Pancreatitis 2006    due to Depakote with normal EUS   . Osteoporosis   . Chronic back pain   . Diabetes mellitus   . Trigger finger   . Anxiety disorder   . Hypertension   . Migraines     chronic headaches  . Diverticulosis     TCS 9/08 by Dr. Delfin Edis for diarrhea . Bx for micro scopic colitis negative.   . Schatzki's ring     non critical / EGD with ED 8/2011with RMR  . S/P colonoscopy [id number], 2011    left-sided diverticula, hx of simple adenomas . 2011, random bx negative for microscopic colitis  . Glaucoma   . Allergic rhinitis   . Hypothyroidism     thyroid goiter  . Anemia   . Blood transfusion   . GERD (gastroesophageal reflux disease)   . Stroke     left sided weakness  . Seizures     unknown etiology-on meds-last seizure was 3 yerars ago  . Anxiety   . Depression    . Metabolic encephalopathy 7/62/8315  . Sleep apnea     on CPAP  . Arthritis   . Gum symptoms     infection on antibiotic  . Chronic neck pain   . Mononeuritis lower limb   . Frequent falls    Past Surgical History  Procedure Laterality Date  . Abdominal hysterectomy  1978  . Cholecystectomy  1984  . Ovarian cyst removal    . Carpal tunnel release Left 07/22/04    Dr. Aline Brochure  . Breast reduction surgery  1994  . Cataract extraction Bilateral   . Biopsy thyroid  2009  . Surgical excision of 3 tumors from right thigh and right buttock  and left upper thigh  2010  . Back surgery  July 2012  . Spine surgery  09/29/2010    Dr. Rolena Infante  . Maloney dilation  12/29/2010    RMR;  . Esophagogastroduodenoscopy  12/29/2010    Rourk-Retained food in the esophagus and stomach, small hiatal hernia, status post Maloney dilation of the esophagus  . Craniotomy  11/23/2011    Procedure: CRANIOTOMY TUMOR EXCISION;  Surgeon: Hosie Spangle, MD;  Location: [facility];  Service: Neurosurgery;  Laterality: N/A;  Craniotomy for tumor resection  . Brain surgery  11/2011    resection of meningioma  . Colonoscopy N/A 09/25/2012    [id number] diverticulosis.  colonic polyp-removed : tubular adenoma  . Esophagogastroduodenoscopy N/A 09/25/2012    [id number] atonic baggy esophagus status post Maloney dilation 54 F. Hiatal hernia  . Givens capsule study N/A 01/15/2013    NORMAL.   . Bacterial overgrowth test N/A 05/05/2013    Procedure: BACTERIAL OVERGROWTH TEST;  Surgeon: Daneil Dolin, MD;  Location: [facility];  Service: Endoscopy;  Laterality: N/A;  7:30  . Transthoracic echocardiogram  2010    EF 60-65%, mild conc LVH, grade 1 diastolic dysfunction; mildly calcified MV annulus with mildly thickened leaflets, mildly calcified MR annulus  . Nm myocar perf wall motion  2006    "relavtiely normal" persantine, mild anterior thinning (breast attenuation artifact), no region of scar/ischemia  .  Cardiac catheterization  05/10/2005    normal coronaries, normal LV systolic function and EF (Dr. Jackie Plum)   Family History  Problem Relation Age of Onset  . Heart attack Mother     HTN  . Pneumonia Father   . Kidney failure Father   . Pancreatic cancer Sister   . Diabetes Brother   . Hypertension Brother   . Colon cancer Neg Hx   . Anesthesia problems Neg Hx   . Hypotension Neg Hx   . Malignant hyperthermia Neg Hx   . Pseudochol deficiency Neg Hx   . Stroke Maternal Grandmother   . Heart attack Maternal Grandfather   . Cancer Sister   . Hypertension Son    History  Substance Use Topics  . Smoking status: Current Every Day Smoker -- 0.25 packs/day for 7 years    Types: Cigarettes  . Smokeless tobacco: Never Used     Comment: "started back but off now for 3 months" (08/18/13)  . Alcohol Use: No     Comment:     OB History    Gravida Para Term Preterm AB TAB SAB Ectopic Multiple Living   6 1 1  5  5         Review of Systems  Neurological: Positive for numbness and headaches.  All other systems reviewed and are negative.     Allergies  Cephalexin; Iron; Milk-related compounds; Penicillins; and Phenazopyridine hcl  Home Medications   Prior to Admission medications   Medication Sig Start Date End Date Taking? Authorizing Provider  aspirin EC 81 MG tablet Take 81 mg by mouth daily.   Yes Historical Provider, MD  Cholecalciferol (D 5000) 5000 UNITS capsule Take 5,000 Units by mouth daily.   Yes Historical Provider, MD  cloNIDine (CATAPRES) 0.3 MG tablet TAKE 1 TABLET BY MOUTH EVERY EIGHT HOURS. ONCE AT 8AM, 4PM, AND 12 MIDNIGHT. 12/23/13  Yes [name], MD  diclofenac sodium (VOLTAREN) 1 % GEL Apply 2 g topically daily as needed (Pain).   Yes Historical Provider, MD  dicyclomine (BENTYL) 10 MG capsule Take 10 mg by mouth 3 (three) times daily before meals.  02/05/14  Yes Historical Provider, MD  glucose blood (ACCU-CHEK AVIVA PLUS) test strip Use to test blood  sugar 4 times daily as instructed. Dx code: E10.65 01/22/14  Yes Philemon Kingdom, MD  hydrochlorothiazide (HYDRODIURIL) 25 MG tablet Take 1 tablet (25 mg total) by mouth daily. 12/23/13  Yes [name], MD  HYDROmorphone (DILAUDID) 4 MG tablet Take 4 mg  by mouth 3 (three) times daily as needed. For pain 02/20/14  Yes Historical Provider, MD  insulin aspart (NOVOLOG FLEXPEN) 100 UNIT/ML FlexPen Inject 14-20 Units into the skin 3 (three) times daily with meals. Patient taking differently: Inject 16 Units into the skin 3 (three) times daily with meals.  01/22/14  Yes Philemon Kingdom, MD  Insulin Glargine (LANTUS) 100 UNIT/ML Solostar Pen Inject 50 Units into the skin daily at 10 pm. 01/22/14  Yes Philemon Kingdom, MD  JANUVIA 25 MG tablet Take 25 mg by mouth daily.  02/06/14  Yes Historical Provider, MD  lamoTRIgine (LAMICTAL) 100 MG tablet Take 1.5 tablets (150 mg total) by mouth every morning. 02/23/14  Yes Adam Melvern Sample, DO  levothyroxine (LEVOXYL) 50 MCG tablet Take 50 mcg by mouth every morning.    Yes Historical Provider, MD  LORazepam (ATIVAN) 0.5 MG tablet Take 0.5 mg by mouth at bedtime.    Yes Historical Provider, MD  losartan (COZAAR) 100 MG tablet TAKE 1 TABLET BY MOUTH ONCE DAILY. 02/09/14  Yes [name], MD  methocarbamol (ROBAXIN) 500 MG tablet Take 500 mg by mouth every morning.    Yes Historical Provider, MD  mometasone-formoterol (DULERA) 100-5 MCG/ACT AERO Inhale 2 puffs into the lungs 2 (two) times daily. 10/30/13  Yes Deneise Lever, MD  montelukast (SINGULAIR) 10 MG tablet TAKE ONE TABLET BY MOUTH ONCE DAILY. 02/17/14  Yes [name], MD  Multiple Vitamins-Minerals (ONE-A-DAY 50 PLUS PO) Take 1 tablet by mouth daily.   Yes Historical Provider, MD  pregabalin (LYRICA) 75 MG capsule Take 75 mg by mouth 2 (two) times daily.  10/26/11  Yes Carole Civil, MD  RABEprazole (ACIPHEX) 20 MG tablet Take 20 mg by mouth every morning.   Yes Historical  Provider, MD  RESTASIS 0.05 % ophthalmic emulsion Place 1 drop into both eyes 2 (two) times daily.  02/04/14  Yes Historical Provider, MD  sertraline (ZOLOFT) 100 MG tablet Take 100 mg by mouth every morning.    Yes Historical Provider, MD  spironolactone (ALDACTONE) 25 MG tablet TAKE ONE TABLET BY MOUTH DAILY. 02/09/14  Yes [name], MD  thioridazine (MELLARIL) 10 MG tablet Take 20 mg by mouth at bedtime.  08/16/12  Yes Historical Provider, MD  traZODone (DESYREL) 50 MG tablet Take 50 mg by mouth at bedtime.  05/14/12  Yes Historical Provider, MD  ZETIA 10 MG tablet TAKE ONE TABLET BY MOUTH ONCE DAILY. 02/17/14  Yes [name], MD  hydroxychloroquine (PLAQUENIL) 200 MG tablet Take 200 mg by mouth daily.    Historical Provider, MD  Insulin Pen Needle (UNIFINE PENTIPS) 31G X 6 MM MISC Use to inject insulin 4 times daily as directed. 01/22/14   Philemon Kingdom, MD  metoprolol (LOPRESSOR) 50 MG tablet Take 50 mg by mouth 2 (two) times daily.  02/04/14   Historical Provider, MD  mirtazapine (REMERON) 30 MG tablet Take 30 mg by mouth at bedtime.     Historical Provider, MD  RABEprazole (ACIPHEX) 20 MG tablet TAKE 1 TABLET BY MOUTH TWICE DAILY. Patient not taking: Reported on 03/03/2014 01/12/14   Mahala Menghini, PA-C  rosuvastatin (CRESTOR) 5 MG tablet TAKE 1 TABLET BY MOUTH AT BEDTIME. Patient taking differently: Take 5 mg by mouth at bedtime. TAKE 1 TABLET BY MOUTH AT BEDTIME. 12/23/13   [name], MD  terbinafine (LAMISIL) 250 MG tablet Take 1 tablet by mouth daily. 08/15/13   Historical Provider, MD   BP 124/59  mmHg  Pulse 88  Resp 18  Ht 4\' 11"  (1.499 m)  Wt 169 lb (76.658 kg)  BMI 34.12 kg/m2  SpO2 98% Physical Exam 63 year old female, resting comfortably and in no acute distress. Vital signs are normal. Oxygen saturation is 98%, which is normal. Head is normocephalic and atraumatic. PERRLA, EOMI. Oropharynx is clear. And I show no hemorrhage, exudate, or  papilledema. Neck is nontender and supple without adenopathy or JVD. Back is nontender and there is no CVA tenderness. Lungs are clear without rales, wheezes, or rhonchi. Chest is nontender. Heart has regular rate and rhythm without murmur. Abdomen is soft, flat, nontender without masses or hepatosplenomegaly and peristalsis is normoactive. Extremities have no cyanosis or edema, full range of motion is present. Skin is warm and dry without rash. Neurologic: Speech is slow and slightly dysarthric but at baseline per family member, mental status otherwise normal, cranial nerves are intact, there are no motor or sensory deficits.  ED Course  Procedures (including critical care time)  DIAGNOSTIC STUDIES: Oxygen Saturation is 98% on RA, normal by my interpretation.    COORDINATION OF CARE: 11:56 PM Discussed treatment plan with pt at bedside and pt agreed to plan.   Labs Review Labs Reviewed  CBG MONITORING, ED - Abnormal; Notable for the following:    Glucose-Capillary 124 (*)    All other components within normal limits   MDM   Final diagnoses:  Essential hypertension    Transient hypertension which has spontaneously resolved. No evidence of end organ damage. She is discharged with reassurance. She was past due for her evening dose of clonidine so that is given in the ED prior to discharge.  I personally performed the services described in this documentation, which was scribed in my presence. The recorded information has been reviewed and is accurate.   Delora Fuel, MD 37/16/96 [id number]

## 2014-03-03 NOTE — Discharge Instructions (Signed)
If your blood pressure is ever very high, wait about 15 minutes and recheck it. Often, it will come down on it's own.   Hypertension Hypertension, commonly called high blood pressure, is when the force of blood pumping through your arteries is too strong. Your arteries are the blood vessels that carry blood from your heart throughout your body. A blood pressure reading consists of a higher number over a lower number, such as 110/72. The higher number (systolic) is the pressure inside your arteries when your heart pumps. The lower number (diastolic) is the pressure inside your arteries when your heart relaxes. Ideally you want your blood pressure below 120/80. Hypertension forces your heart to work harder to pump blood. Your arteries may become narrow or stiff. Having hypertension puts you at risk for heart disease, stroke, and other problems.  RISK FACTORS Some risk factors for high blood pressure are controllable. Others are not.  Risk factors you cannot control include:   Race. You may be at higher risk if you are African American.  Age. Risk increases with age.  Gender. Men are at higher risk than women before age 42 years. After age 24, women are at higher risk than men. Risk factors you can control include:  Not getting enough exercise or physical activity.  Being overweight.  Getting too much fat, sugar, calories, or salt in your diet.  Drinking too much alcohol. SIGNS AND SYMPTOMS Hypertension does not usually cause signs or symptoms. Extremely high blood pressure (hypertensive crisis) may cause headache, anxiety, shortness of breath, and nosebleed. DIAGNOSIS  To check if you have hypertension, your health care provider will measure your blood pressure while you are seated, with your arm held at the level of your heart. It should be measured at least twice using the same arm. Certain conditions can cause a difference in blood pressure between your right and left arms. A blood  pressure reading that is higher than normal on one occasion does not mean that you need treatment. If one blood pressure reading is high, ask your health care provider about having it checked again. TREATMENT  Treating high blood pressure includes making lifestyle changes and possibly taking medicine. Living a healthy lifestyle can help lower high blood pressure. You may need to change some of your habits. Lifestyle changes may include:  Following the DASH diet. This diet is high in fruits, vegetables, and whole grains. It is low in salt, red meat, and added sugars.  Getting at least 2 hours of brisk physical activity every week.  Losing weight if necessary.  Not smoking.  Limiting alcoholic beverages.  Learning ways to reduce stress. If lifestyle changes are not enough to get your blood pressure under control, your health care provider may prescribe medicine. You may need to take more than one. Work closely with your health care provider to understand the risks and benefits. HOME CARE INSTRUCTIONS  Have your blood pressure rechecked as directed by your health care provider.   Take medicines only as directed by your health care provider. Follow the directions carefully. Blood pressure medicines must be taken as prescribed. The medicine does not work as well when you skip doses. Skipping doses also puts you at risk for problems.   Do not smoke.   Monitor your blood pressure at home as directed by your health care provider. SEEK MEDICAL CARE IF:   You think you are having a reaction to medicines taken.  You have recurrent headaches or feel dizzy.  You  have swelling in your ankles.  You have trouble with your vision. SEEK IMMEDIATE MEDICAL CARE IF:  You develop a severe headache or confusion.  You have unusual weakness, numbness, or feel faint.  You have severe chest or abdominal pain.  You vomit repeatedly.  You have trouble breathing. MAKE SURE YOU:   Understand  these instructions.  Will watch your condition.  Will get help right away if you are not doing well or get worse. Document Released: 02/27/2005 Document Revised: 07/14/2013 Document Reviewed: 12/20/2012 [facility] Patient Information 2015 [place], [place]. This information is not intended to replace advice given to you by your health care provider. Make sure you discuss any questions you have with your health care provider.

## 2014-03-03 NOTE — ED Notes (Signed)
Pt reports she "got excited today" and BP was 195/90. Pt reports she called her PCP and was told to come have her pressure checked.

## 2014-03-04 ENCOUNTER — Telehealth: Payer: Self-pay | Admitting: Family Medicine

## 2014-03-04 DIAGNOSIS — I10 Essential (primary) hypertension: Secondary | ICD-10-CM | POA: Diagnosis not present

## 2014-03-04 LAB — CBC WITH DIFFERENTIAL/PLATELET
BASOS ABS: 0 10*3/uL (ref 0.0–0.1)
BASOS PCT: 0 % (ref 0–1)
Eosinophils Absolute: 0.2 10*3/uL (ref 0.0–0.7)
Eosinophils Relative: 3 % (ref 0–5)
HCT: 35.3 % — ABNORMAL LOW (ref 36.0–46.0)
Hemoglobin: 10.8 g/dL — ABNORMAL LOW (ref 12.0–15.0)
Lymphocytes Relative: 33 % (ref 12–46)
Lymphs Abs: 2.4 10*3/uL (ref 0.7–4.0)
MCH: 22.5 pg — ABNORMAL LOW (ref 26.0–34.0)
MCHC: 30.6 g/dL (ref 30.0–36.0)
MCV: 73.5 fL — AB (ref 78.0–100.0)
MPV: 8.9 fL — ABNORMAL LOW (ref 9.4–12.4)
Monocytes Absolute: 0.5 10*3/uL (ref 0.1–1.0)
Monocytes Relative: 7 % (ref 3–12)
Neutro Abs: 4.1 10*3/uL (ref 1.7–7.7)
Neutrophils Relative %: 57 % (ref 43–77)
Platelets: 277 10*3/uL (ref 150–400)
RBC: 4.8 MIL/uL (ref 3.87–5.11)
RDW: 15.3 % (ref 11.5–15.5)
WBC: 7.2 10*3/uL (ref 4.0–10.5)

## 2014-03-04 LAB — IRON AND TIBC
%SAT: 23 % (ref 20–55)
Iron: 90 ug/dL (ref 42–145)
TIBC: 392 ug/dL (ref 250–470)
UIBC: 302 ug/dL (ref 125–400)

## 2014-03-04 LAB — FERRITIN: Ferritin: 67 ng/mL (ref 10–291)

## 2014-03-04 NOTE — Telephone Encounter (Signed)
Pt called in on 12/222/2015 stating that her BP was 190 /100, I advised her to go to the ED for further eval, she called in after office hours

## 2014-03-05 ENCOUNTER — Other Ambulatory Visit: Payer: Self-pay | Admitting: Internal Medicine

## 2014-03-05 NOTE — Telephone Encounter (Signed)
Rx(s) sent to pharmacy electronically.  

## 2014-03-09 ENCOUNTER — Ambulatory Visit: Payer: Commercial Managed Care - HMO | Admitting: Neurology

## 2014-03-11 ENCOUNTER — Other Ambulatory Visit: Payer: Self-pay

## 2014-03-11 ENCOUNTER — Encounter: Payer: Self-pay | Admitting: Internal Medicine

## 2014-03-11 ENCOUNTER — Ambulatory Visit (INDEPENDENT_AMBULATORY_CARE_PROVIDER_SITE_OTHER): Payer: Commercial Managed Care - HMO | Admitting: Internal Medicine

## 2014-03-11 ENCOUNTER — Telehealth: Payer: Self-pay | Admitting: Family Medicine

## 2014-03-11 VITALS — BP 96/50 | HR 64 | Temp 98.1°F | Resp 12 | Wt 171.0 lb

## 2014-03-11 DIAGNOSIS — IMO0001 Reserved for inherently not codable concepts without codable children: Secondary | ICD-10-CM

## 2014-03-11 DIAGNOSIS — E1165 Type 2 diabetes mellitus with hyperglycemia: Principal | ICD-10-CM

## 2014-03-11 DIAGNOSIS — E1065 Type 1 diabetes mellitus with hyperglycemia: Secondary | ICD-10-CM

## 2014-03-11 DIAGNOSIS — Z794 Long term (current) use of insulin: Principal | ICD-10-CM

## 2014-03-11 LAB — HEMOGLOBIN A1C: HEMOGLOBIN A1C: 7.6 % — AB (ref 4.6–6.5)

## 2014-03-11 MED ORDER — MECLIZINE HCL 12.5 MG PO TABS: 12.5000 mg | ORAL_TABLET | Freq: Three times a day (TID) | ORAL | Status: DC | PRN

## 2014-03-11 MED ORDER — NYSTATIN 100000 UNIT/GM EX POWD: Freq: Four times a day (QID) | CUTANEOUS | Status: DC

## 2014-03-11 NOTE — Patient Instructions (Addendum)
  Please decrease: - Lantus to 40 units at bedtime  Decrease: - Novolog to: 12 units with a smaller meal 15 units with a larger meal 18 units with a large meal Please return in 1 month with your sugar log. Please stop at the lab.    Please move Aciphex before lunch.  Please skip HCTZ tomorrow. Please call and discuss with your PCP about your blood pressure medications.

## 2014-03-11 NOTE — Telephone Encounter (Signed)
Wanted nystatin and meclizine refilled

## 2014-03-11 NOTE — Progress Notes (Signed)
Patient ID: [name], female   DOB: 07/06/1950, 63 y.o.   MRN: [id number]  HPI: [name] is a 63 y.o.-year-old female, initially referred by her PCP, [name], for management of DM2, dx 1995, insulin-dependent since 1997, uncontrolled, with complications (gastroparesis, cerebrovasc. Ds-  H/o stroke, PN). Last visit 2 mo ago.  CBG in the office 47 >> pt given Sprite  DM2: Last hemoglobin A1c was: 10/28/2013: HbA1c 8.5% Lab Results  Component Value Date   HGBA1C 8.3* 08/06/2013   HGBA1C 7.2* 04/11/2013   HGBA1C 7.1* 01/09/2013   Pt was on a regimen of: - Tradjenta 5 mg daily in am - Lantus 50 units at bedtime - Novolog 11-16 units 4x a day She tried Metformin in the past >> tolerated it well  She is now on: - Lantus 60 units at bedtime  - Novolog : 14 units with a smaller meal 17 units with a larger meal 20 units with a large meal  Pt checks her sugars 4 a day and they are: - am: 153, 200-300, some 400s >> 68-142, 164 - 2h after b'fast: n/c  - before lunch: 192-243 >> 78-192 - 2h after lunch: n/c - before dinner: 200-292 >> 55 x1, 77-174 - 2h after dinner: n/c - bedtime: 190-200s, rarely 300s >> 72, 94-210 - nighttime: n/c No lows. Lowest sugar was 83 x1; she has hypoglycemia awareness at 70.  Highest sugar was 400s.  Pt's meals are: - Breakfast: eggs, cereals, fruit, oatmeal, bacon - Lunch: sandwich, beef, mashed potatoes,diet soda - Dinner: chicken, beef, fish, potatoes, vegetables - Snacks: 1-2: fruit  She exercises 1x a day - walks.  - + mild CKD, last BUN/creatinine:  Lab Results  Component Value Date   BUN 23 12/19/2013   CREATININE 1.04 12/19/2013  On Losartan. - last set of lipids: Lab Results  Component Value Date   CHOL 171 12/19/2013   HDL 45 12/19/2013   LDLCALC 78 12/19/2013   TRIG 240* 12/19/2013   CHOLHDL 3.8 12/19/2013  On Ezetimibe, was on Crestor, but stopped.  - last eye exam was in 09/2013. No DR. Had cataract sx B. - +  numbness and tingling in her leg L leg (affected by stroke) but also in R. Sees podiatry.  Pt has FH of DM in mother, father, brothers.  Hypothyroidism: - dx > 10 years ago. - On Levoxyl 50 mcg daily:  - in am (at 7 am) - fasting - with water - >30 min b'f b'fast - no Ca - no Fe - + MVI 5 hrs later - + Aciphex at 7 am! Despite advice to take it 4h later  Last TSH:  Lab Results  Component Value Date   TSH 1.453 08/02/2011   TSH 0.666 09/14/2009   TSH 0.295  05/30/2009   TSH 1.126 03/18/2009   TSH 0.104  09/15/2008   TSH 0.066  09/14/2008   TSH 1.041  02/03/2008   TSH 0.992 09/25/2007   TSH 0.257 08/25/2007   TSH 1.181 05/23/2006   She has a h/o thyroid nodules. + dysphagia, + hoarseness.  Reviewed thyroid U/S (10/2013):  Right thyroid lobe: 3.7 x 1.0 x 1.8 cm   Left thyroid lobe: 3.5 x 1.5 x 1.6 cm   Isthmus: 0.5 cm   Focal nodules: There is a 0.3 mm calcified nodule in the right midzone. There is a 0.6 x 0.7 x 0.5 cm hypoechoic nodule in the  lateral aspect of the right midzone. There is a  0.7 x 0.6 x 0.5 cm hyperechoic nodule in the lateral aspect of the right lower pole.  There is a 2.2 x 1.1 x 1.7 cm complex solid nodule in the inferior aspect of the isthmus extending adjacent to the left lower pole.  There is a 1.4 x 2.1 x 1.4 cm complex solid nodule in the left lower pole.   Lymphadenopathy: None visualized.  Repeat U/S (01/2014): stable appearance of nodules  FNA (11/05/2013) x2: benign    ROS: Not discussed b/c hypoglycemia  I reviewed pt's medications, allergies, PMH, social hx, family hx, and changes were documented in the history of present illness. Otherwise, unchanged from my initial visit note. Past Medical History  Diagnosis Date  . Bipolar disorder   . CVA (cerebral infarction)   . Pancreatitis 2006    due to Depakote with normal EUS   . Osteoporosis   . Chronic back pain   . Diabetes mellitus   . Trigger finger   . Anxiety disorder   .  Hypertension   . Migraines     chronic headaches  . Diverticulosis     TCS 9/08 by Dr. Delfin Edis for diarrhea . Bx for micro scopic colitis negative.   . Schatzki's ring     non critical / EGD with ED 8/2011with RMR  . S/P colonoscopy 3710,6269, 2011    left-sided diverticula, hx of simple adenomas . 2011, random bx negative for microscopic colitis  . Glaucoma   . Allergic rhinitis   . Hypothyroidism     thyroid goiter  . Anemia   . Blood transfusion   . GERD (gastroesophageal reflux disease)   . Stroke     left sided weakness  . Seizures     unknown etiology-on meds-last seizure was 3 yerars ago  . Anxiety   . Depression   . Metabolic encephalopathy 4/85/4627  . Sleep apnea     on CPAP  . Arthritis   . Gum symptoms     infection on antibiotic  . Chronic neck pain   . Mononeuritis lower limb   . Frequent falls    Past Surgical History  Procedure Laterality Date  . Abdominal hysterectomy  1978  . Cholecystectomy  1984  . Ovarian cyst removal    . Carpal tunnel release Left 07/22/04    Dr. Aline Brochure  . Breast reduction surgery  1994  . Cataract extraction Bilateral   . Biopsy thyroid  2009  . Surgical excision of 3 tumors from right thigh and right buttock  and left upper thigh  2010  . Back surgery  July 2012  . Spine surgery  09/29/2010    Dr. Rolena Infante  . Maloney dilation  12/29/2010    RMR;  . Esophagogastroduodenoscopy  12/29/2010    Rourk-Retained food in the esophagus and stomach, small hiatal hernia, status post Maloney dilation of the esophagus  . Craniotomy  11/23/2011    Procedure: CRANIOTOMY TUMOR EXCISION;  Surgeon: Hosie Spangle, MD;  Location: [facility];  Service: Neurosurgery;  Laterality: N/A;  Craniotomy for tumor resection  . Brain surgery  11/2011    resection of meningioma  . Colonoscopy N/A 09/25/2012    [id number] diverticulosis.  colonic polyp-removed : tubular adenoma  . Esophagogastroduodenoscopy N/A 09/25/2012    [id number] atonic  baggy esophagus status post Maloney dilation 9 F. Hiatal hernia  . Givens capsule study N/A 01/15/2013    NORMAL.   . Bacterial overgrowth test N/A 05/05/2013    Procedure: BACTERIAL  OVERGROWTH TEST;  Surgeon: Daneil Dolin, MD;  Location: [facility];  Service: Endoscopy;  Laterality: N/A;  7:30  . Transthoracic echocardiogram  2010    EF 60-65%, mild conc LVH, grade 1 diastolic dysfunction; mildly calcified MV annulus with mildly thickened leaflets, mildly calcified MR annulus  . Nm myocar perf wall motion  2006    "relavtiely normal" persantine, mild anterior thinning (breast attenuation artifact), no region of scar/ischemia  . Cardiac catheterization  05/10/2005    normal coronaries, normal LV systolic function and EF (Dr. Jackie Plum)   History   Social History  . Marital Status: Divorced    Spouse Name: N/A    Number of Children: 1  . Years of Education: 12   Occupational History  . disabled     Social History Main Topics  . Smoking status: Current Every Day Smoker -- 0.25 packs/day for 7 years    Types: Cigarettes  . Smokeless tobacco: Never Used     Comment: "started back but off now for 3 months" (08/18/13)  . Alcohol Use: No     Comment:    . Drug Use: No   Current Outpatient Prescriptions on File Prior to Visit  Medication Sig Dispense Refill  . aspirin EC 81 MG tablet Take 81 mg by mouth daily.    . Cholecalciferol (D 5000) 5000 UNITS capsule Take 5,000 Units by mouth daily.    . cloNIDine (CATAPRES) 0.3 MG tablet TAKE 1 TABLET BY MOUTH EVERY EIGHT HOURS. ONCE AT 8AM, 4PM, AND 12 MIDNIGHT. 90 tablet 3  . diclofenac sodium (VOLTAREN) 1 % GEL Apply 2 g topically daily as needed (Pain).    [name] dicyclomine (BENTYL) 10 MG capsule Take 10 mg by mouth 3 (three) times daily before meals.     [name] glucose blood (ACCU-CHEK AVIVA PLUS) test strip Use to test blood sugar 4 times daily as instructed. Dx code: E10.65 400 each 3  . hydrochlorothiazide (HYDRODIURIL) 25 MG tablet Take 1  tablet (25 mg total) by mouth daily. 30 tablet 3  . HYDROmorphone (DILAUDID) 4 MG tablet Take 4 mg by mouth 3 (three) times daily as needed. For pain    . hydroxychloroquine (PLAQUENIL) 200 MG tablet Take 200 mg by mouth daily.    . insulin aspart (NOVOLOG FLEXPEN) 100 UNIT/ML FlexPen Inject 14-20 Units into the skin 3 (three) times daily with meals. (Patient taking differently: Inject 16 Units into the skin 3 (three) times daily with meals. ) 45 mL 1  . Insulin Glargine (LANTUS) 100 UNIT/ML Solostar Pen Inject 50 Units into the skin daily at 10 pm. 45 mL 1  . Insulin Pen Needle (UNIFINE PENTIPS) 31G X 6 MM MISC Use to inject insulin 4 times daily as directed. 390 each 3  .      . lamoTRIgine (LAMICTAL) 100 MG tablet Take 1.5 tablets (150 mg total) by mouth every morning. 45 tablet 5  . levothyroxine (LEVOXYL) 50 MCG tablet Take 50 mcg by mouth every morning.     [name] LORazepam (ATIVAN) 0.5 MG tablet Take 0.5 mg by mouth at bedtime.     [name] losartan (COZAAR) 100 MG tablet TAKE 1 TABLET BY MOUTH ONCE DAILY. 30 tablet 3  . methocarbamol (ROBAXIN) 500 MG tablet Take 500 mg by mouth every morning.     . metoprolol (LOPRESSOR) 50 MG tablet TAKE 1 TABLET BY MOUTH TWICE DAILY. 60 tablet 5  . mirtazapine (REMERON) 30 MG tablet Take 30 mg by mouth at  bedtime.     . mometasone-formoterol (DULERA) 100-5 MCG/ACT AERO Inhale 2 puffs into the lungs 2 (two) times daily. 1 Inhaler 0  . montelukast (SINGULAIR) 10 MG tablet TAKE ONE TABLET BY MOUTH ONCE DAILY. 30 tablet 2  . Multiple Vitamins-Minerals (ONE-A-DAY 50 PLUS PO) Take 1 tablet by mouth daily.    . pregabalin (LYRICA) 75 MG capsule Take 75 mg by mouth 2 (two) times daily.     . RABEprazole (ACIPHEX) 20 MG tablet TAKE 1 TABLET BY MOUTH TWICE DAILY. (Patient not taking: Reported on 03/03/2014) 60 tablet 5  . RABEprazole (ACIPHEX) 20 MG tablet Take 20 mg by mouth every morning.    . RESTASIS 0.05 % ophthalmic emulsion Place 1 drop into both eyes 2 (two) times  daily.     . rosuvastatin (CRESTOR) 5 MG tablet TAKE 1 TABLET BY MOUTH AT BEDTIME. (Patient taking differently: Take 5 mg by mouth at bedtime. TAKE 1 TABLET BY MOUTH AT BEDTIME.) 30 tablet 3  . sertraline (ZOLOFT) 100 MG tablet Take 100 mg by mouth every morning.     [name] spironolactone (ALDACTONE) 25 MG tablet TAKE ONE TABLET BY MOUTH DAILY. 30 tablet 3  . terbinafine (LAMISIL) 250 MG tablet Take 1 tablet by mouth daily.    [name] thioridazine (MELLARIL) 10 MG tablet Take 20 mg by mouth at bedtime.     . traZODone (DESYREL) 50 MG tablet Take 50 mg by mouth at bedtime.     [name] ZETIA 10 MG tablet TAKE ONE TABLET BY MOUTH ONCE DAILY. 30 tablet 2   No current facility-administered medications on file prior to visit.   Allergies  Allergen Reactions  . Cephalexin Hives  . Iron Nausea And Vomiting  . Milk-Related Compounds Other (See Comments)    Doesn't agree with stomach.   . Penicillins Hives  . Phenazopyridine Hcl Other (See Comments)    Whelps     Family History  Problem Relation Age of Onset  . Heart attack Mother     HTN  . Pneumonia Father   . Kidney failure Father   . Pancreatic cancer Sister   . Diabetes Brother   . Hypertension Brother   . Colon cancer Neg Hx   . Anesthesia problems Neg Hx   . Hypotension Neg Hx   . Malignant hyperthermia Neg Hx   . Pseudochol deficiency Neg Hx   . Stroke Maternal Grandmother   . Heart attack Maternal Grandfather   . Cancer Sister   . Hypertension Son    PE: BP 96/50 mmHg  Pulse 64  Temp(Src) 98.1 F (36.7 C) (Oral)  Resp 12  Wt 171 lb (77.565 kg)  SpO2 97% Wt Readings from Last 3 Encounters:  03/11/14 171 lb (77.565 kg)  03/03/14 169 lb (76.658 kg)  02/23/14 169 lb 8 oz (76.885 kg)   Constitutional: overweight, in NAD Eyes: surgical pupils, EOMI, no exophthalmos ENT: moist mucous membranes, no thyromegaly, large thyroid nodule felt in isthmus, no cervical lymphadenopathy Cardiovascular: RRR, No MRG Respiratory: CTA  B Gastrointestinal: abdomen soft, NT, ND, BS+ Musculoskeletal: no deformities, strength intact in all 4 Skin: moist, warm, no rashes Neurological: no tremor with outstretched hands, DTR normal in all 4  ASSESSMENT: 1. DM2, insulin-dependent, uncontrolled, without complications - gastroparesis - cerebrovasc. Ds -  H/o stroke - PN  2. Hypothyroidism  3. MNG 01/20/2014 Thyroid U/S: Right thyroid lobe  Measurements: 4.4 cm x 1.2 cm x 1.7 cm. Multiple right-sided nodules identified.  Each right-sided nodule demonstrates  increased echogenicity, with the superior measuring 7 mm -8 mm, most inferior measuring 9 mm - 10 mm. A small, 3 mm focus of calcium with posterior shadowing is evident. There is also a mid nodule measuring 7 mm, which is echogenic.  Left thyroid lobe  Measurements: 5.1 cm x 2.1 cm x 2.3 cm. Dominant lesion at the inferior pole of left thyroid is again evident, which has been previously biopsied (10/29/2012). This nodule measures 1.8 cm x 1.7 cm x 2.5 cm. (Previous 1.4 cm x 2.1 cm x 1.4 cm)  Isthmus  Thickness: 4 mm-5 mm. Isthmic nodule again noted, previously biopsied (10/29/2012). Currently this nodule measures 2.2 cm x 1.4 cm x 1.8 cm. (previous measurement 2.2 cm x 1.1 cm x 1.7 cm).  There is a new nodule identified within the isthmus with heterogeneously hyperechoic characteristics. This nodule measures 12 mm x 5.4 mm x 7.2 mm.  Lymphadenopathy None visualized.  PLAN:  1. Patient with long-standing, uncontrolled diabetes, on oral antidiabetic regimen + basal-bolus regimen, with fluctuating sugars, has some 50s and some 300s. However, overall sugars better in the last month. She could not tolerate Metformin. Today: CBG 47 (took 16 units in am for a chicken sandwich + banana) >> given Sprite >> at the end of the visit: CBG 136 - We will decrease her insulin doses: Patient Instructions  Please decrease: - Lantus to 40 units at bedtime  Decrease: -  Novolog to: 12 units with a smaller meal 15 units with a larger meal 18 units with a large meal Please return in 1 month with your sugar log.   - Strongly advised her to start checking sugars at different times of the day - check 3-4 times a day, rotating checks - advised for yearly eye exams - had the flu vaccine this season - check A1c today - Return to clinic in 1 mo with sugar log   2. Hypothyroidism - no recent TFTs - we discussed how to take the thyroid hormone every day, with water, >30 minutes before breakfast, separated by >4 hours from acid reflux medications, calcium, iron, multivitamins.  Move Aciphex before lunch. - we will check her TFTs at next visit  3. MNG - previously had 2 normal Bx's (2014) - felt one nodule enlarging >> new U/S report reviewed >> stable appearance of the nodules, except a small new nodule in isthmus: 1.2 cm - will need a new U/S in 1 year.  - time spent with the patient: 25 min, of which >50% was spent in obtaining information about her sugars, reviewing her previous labs, evaluations, and treatments, counseling her about her conditions (please see the discussed topics above), and developing a plan to further investigate it.   Office Visit on 03/11/2014  Component Date Value Ref Range Status  . Hgb A1c MFr Bld 03/11/2014 7.6* 4.6 - 6.5 % Final   Glycemic Control Guidelines for People with Diabetes:Non Diabetic:  <6%Goal of Therapy: <7%Additional Action Suggested:  >8%   HbA1c better.

## 2014-03-16 ENCOUNTER — Ambulatory Visit (HOSPITAL_COMMUNITY): Payer: Commercial Managed Care - HMO

## 2014-03-17 DIAGNOSIS — E119 Type 2 diabetes mellitus without complications: Secondary | ICD-10-CM | POA: Diagnosis not present

## 2014-03-20 DIAGNOSIS — M5440 Lumbago with sciatica, unspecified side: Secondary | ICD-10-CM | POA: Diagnosis not present

## 2014-03-20 NOTE — Progress Notes (Signed)
Quick Note:  Please let patient know her microcytic anemia is stable. Iron studies are normal. ifobt negative.  Recommend repeat CBC, iron/tibc, ferrtin in 3 months. ______

## 2014-03-21 IMAGING — CT CT HEAD W/O CM
5 of 6 series · 15 of 47 positions shown, 17 images · non-contrast
Comparison: 06/11/2012; 03/09/2012; head and C-spine CT -
02/15/2012

CT HEAD

CLINICAL DATA: Trauma, injury, neck pain, depressed mental status,
history of CVA

CT HEAD WITHOUT CONTRAST
CT CERVICAL SPINE WITHOUT CONTRAST
TECHNIQUE: Multidetector CT imaging of the head and cervical spine
was performed following the standard protocol without intravenous
contrast.  Multiplanar CT image reconstructions of the cervical
spine were also generated.

[Series 3: headseq 4.8 h37s · axial · 0.46mm/px · z∈[+1325,+1375]mm · 2 of 30 slices shown]
[im 10/30  brain]
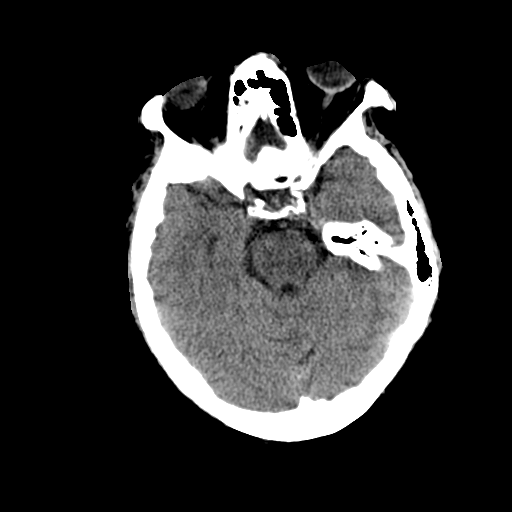
[im 20/30  brain]
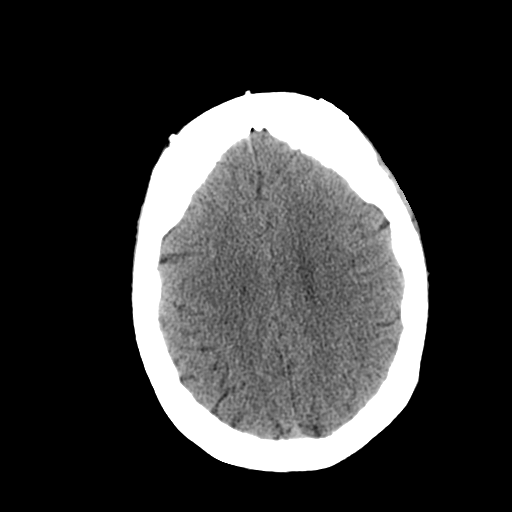

[Series 4: headseq 2.4 h60s · axial · 0.46mm/px · z∈[+1301,+1401]mm · 5 of 60 slices shown, 7 images]
[im 10/60  brain]
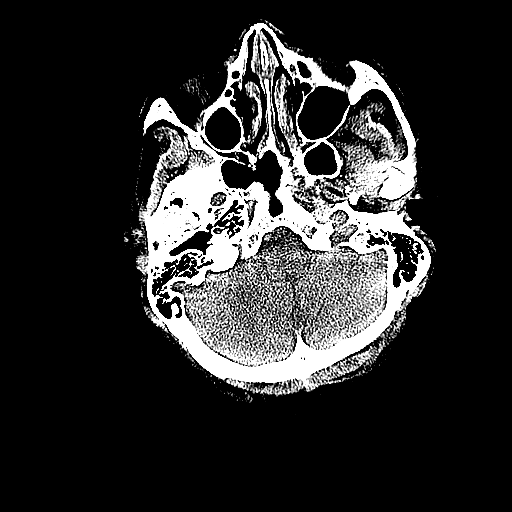
[im 10/60  bone]
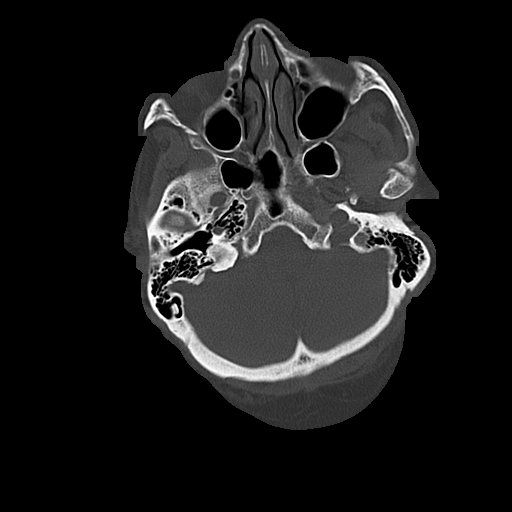
[im 20/60  brain]
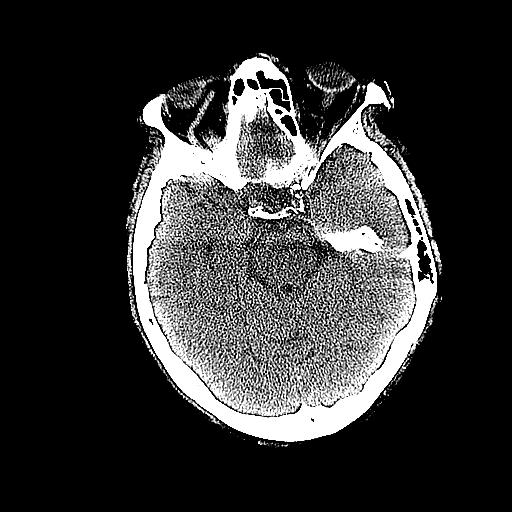
[im 30/60  brain]
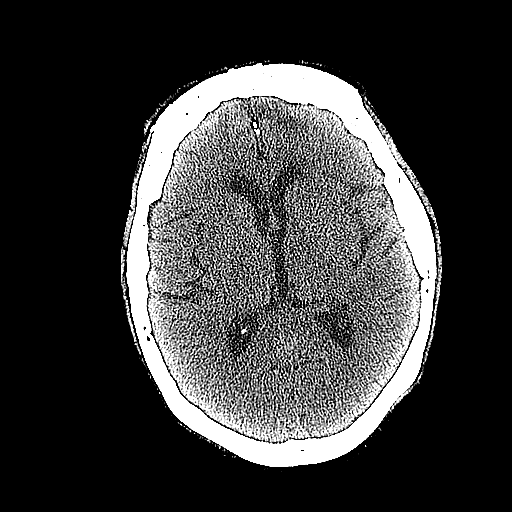
[im 40/60  brain]
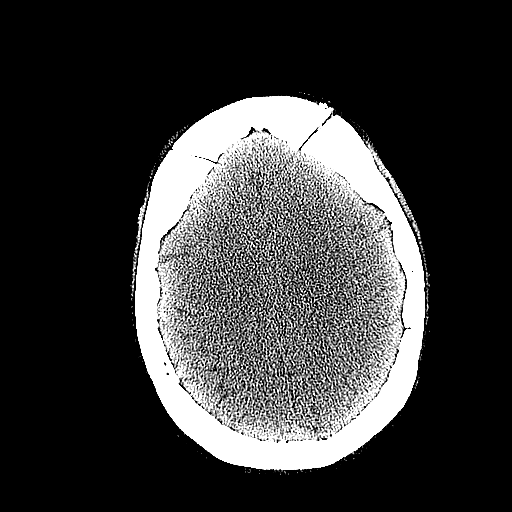
[im 50/60  brain]
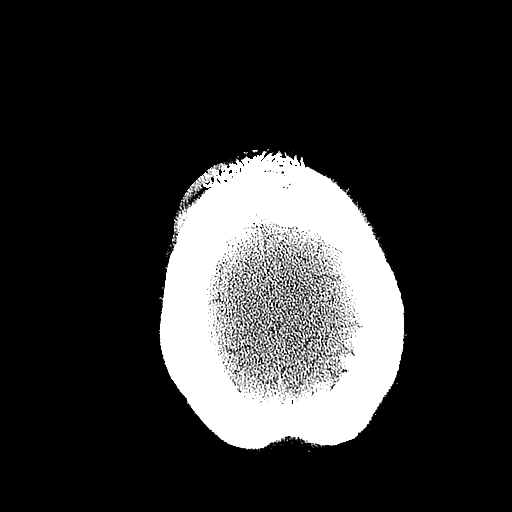
[im 50/60  bone]
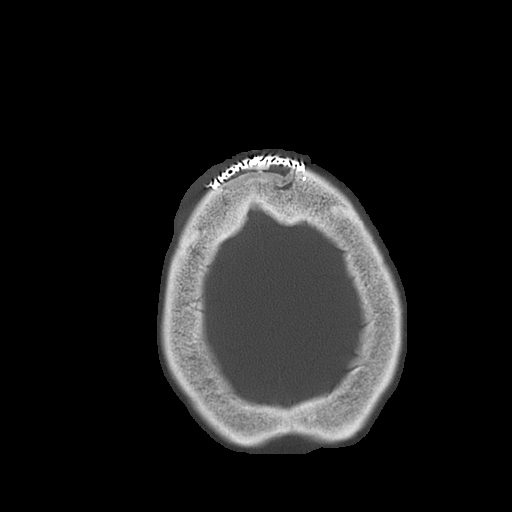

[Series 8: sagittal bone 2.0 · sagittal · 0.25mm/px · 3 of 60 slices shown]
[im 22/60  brain]
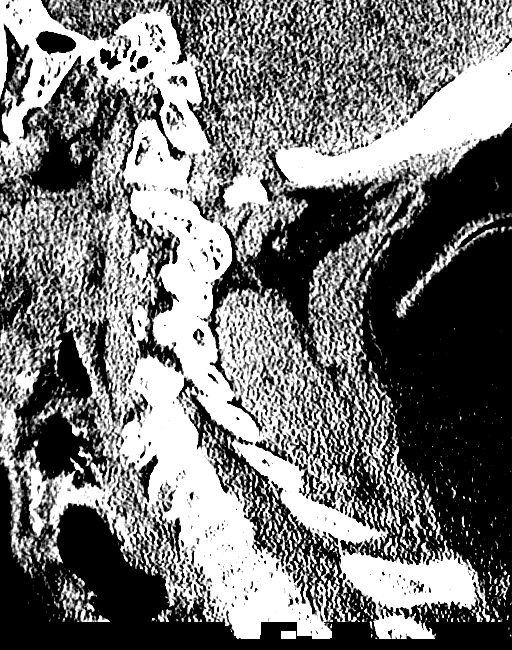
[im 30/60  brain]
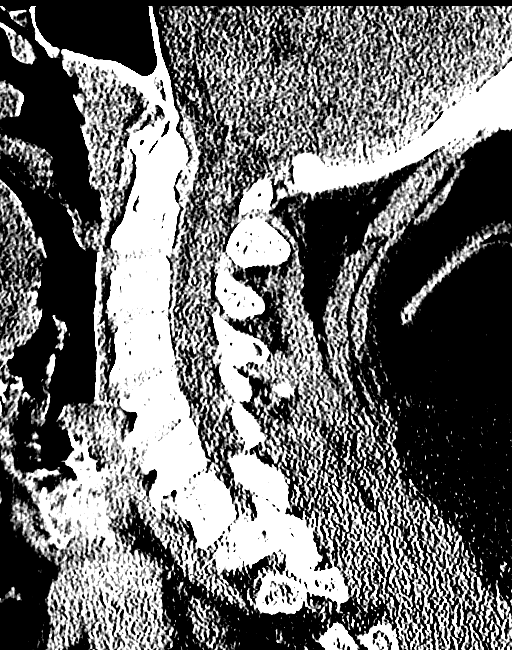
[im 39/60  brain]
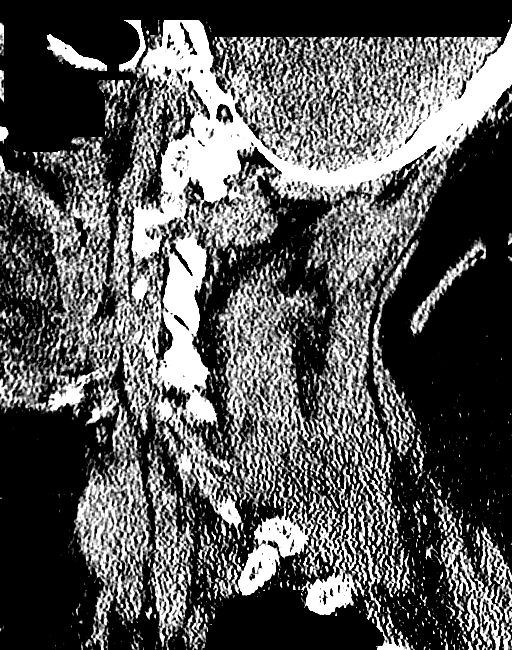

[Series 9: coronal bone 2.0 · coronal · 0.34mm/px · 3 of 58 slices shown]
[im 20/58  brain]
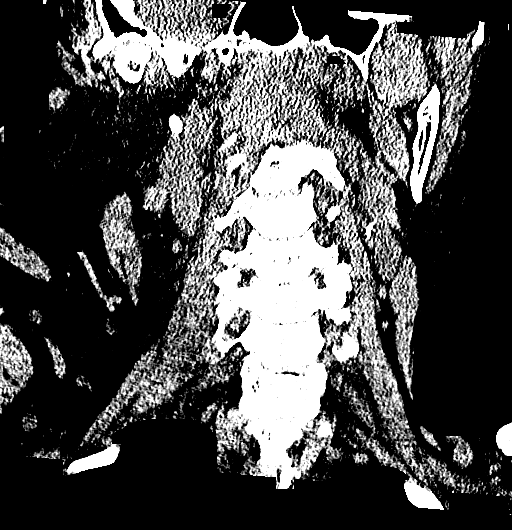
[im 26/58  brain]
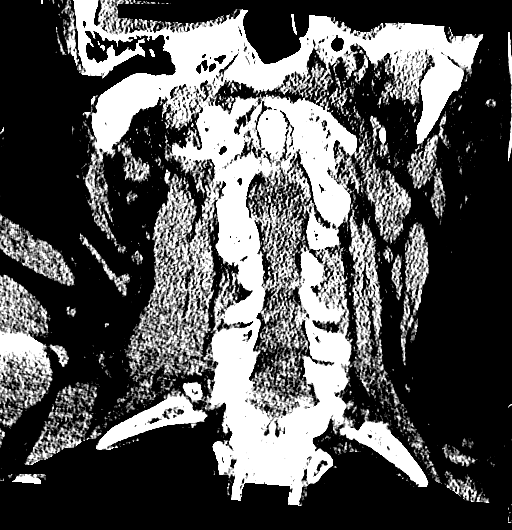
[im 32/58  brain]
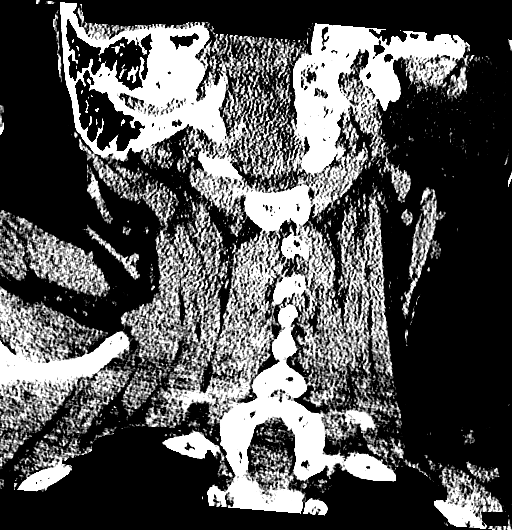

[Series 10: axial bone 2.0 · axial · 0.24mm/px · z∈[+1123,+1141]mm · 2 of 85 slices shown]
[im 10/85  bone]
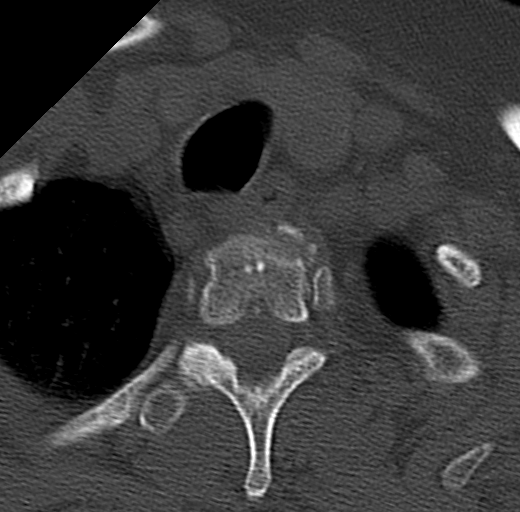
[im 19/85  bone]
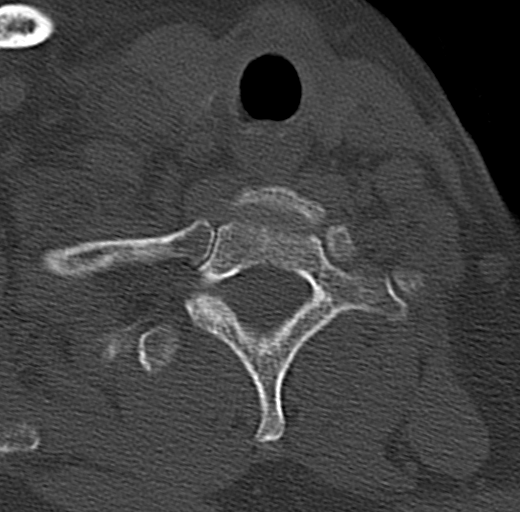

[15 of 47 positions shown; findings below may reference images not displayed]

FINDINGS: There is soft tissue swelling about the high right forehead (images
22-27).  This finding is without associated displaced calvarial
fracture or acute intraparenchymal or extra-axial hemorrhage.

Grossly stable sequela of prior frontal craniotomy.  Surgical clips
are again seen within the ventral aspect of the medial midline
falx.  There is a minimal amount of apparent encephalomalacia
involving the medial aspect of the bilateral frontal lobes.  The
gray-white differentiation is otherwise well maintained.  No CT
evidence of acute large territory infarct.

Vascular calcifications within the bilateral carotid siphons.  Post
bilateral cataract surgery.
IMPRESSION: 1.  Soft tissue swelling about the right forehead without
associated displaced calvarial fracture or acute intra parenchymal
or extra-axial hemorrhage.
2. Stable postsurgical change of the frontal cortex and frontal
lobe as above.

CT CERVICAL SPINE
FINDINGS: C1 to the superior endplate of T2 is imaged.  ]

There is a mild scoliotic curvature of the cervical spine. The
bilateral facets are normally aligned.  No anterolisthesis or
retrolisthesis.  The dens is normally positioned and the lateral
masses of C1.  Normal atlanto-axial articulation.  There is mild
degenerative change of the atlanto-dental articulation.

No fracture or static subluxation of the cervical spine.  Cervical
vertebral body heights are preserved.  Prevertebral soft tissues
are normal.

There is mild to moderate multilevel cervical spine DDD, likely
worse at C5 - 6 and C6 - C7 with disc space height loss, end plate
irregularity and sclerosis.  Limited visualization of the lung
apices are normal.

There is a suspected approximate 1.5 x 1.6 cm hypoattenuating
nodule in the left lobe the thyroid (image 78, series six).
IMPRESSION: 1.  No fracture or static subluxation of the cervical spine.

2.  Mild to moderate multilevel cervical spine DDD.
3.  Possible 1.6 cm nodule within the left lobe of the thyroid.
Further evaluation with dedicated thyroid ultrasound may be
performed as clinically indicated.

## 2014-03-24 ENCOUNTER — Other Ambulatory Visit: Payer: Self-pay | Admitting: Gastroenterology

## 2014-03-24 DIAGNOSIS — D509 Iron deficiency anemia, unspecified: Secondary | ICD-10-CM

## 2014-03-26 ENCOUNTER — Ambulatory Visit: Payer: Commercial Managed Care - HMO | Admitting: Internal Medicine

## 2014-03-30 DIAGNOSIS — E119 Type 2 diabetes mellitus without complications: Secondary | ICD-10-CM | POA: Diagnosis not present

## 2014-03-31 ENCOUNTER — Telehealth: Payer: Self-pay | Admitting: *Deleted

## 2014-03-31 DIAGNOSIS — E119 Type 2 diabetes mellitus without complications: Secondary | ICD-10-CM | POA: Diagnosis not present

## 2014-03-31 NOTE — Telephone Encounter (Signed)
Pt called requesting to speak with [name] about Home Health and she needs a number to them in [place]. Please advise

## 2014-04-01 ENCOUNTER — Telehealth: Payer: Self-pay | Admitting: Internal Medicine

## 2014-04-01 NOTE — Telephone Encounter (Signed)
Pt left message requesting a call back regarding metformin rx

## 2014-04-02 DIAGNOSIS — M150 Primary generalized (osteo)arthritis: Secondary | ICD-10-CM | POA: Diagnosis not present

## 2014-04-02 DIAGNOSIS — E119 Type 2 diabetes mellitus without complications: Secondary | ICD-10-CM | POA: Diagnosis not present

## 2014-04-02 DIAGNOSIS — I639 Cerebral infarction, unspecified: Secondary | ICD-10-CM | POA: Diagnosis not present

## 2014-04-02 DIAGNOSIS — G4733 Obstructive sleep apnea (adult) (pediatric): Secondary | ICD-10-CM | POA: Diagnosis not present

## 2014-04-02 NOTE — Telephone Encounter (Signed)
Called pt and advised her per Dr Gherghe's note. Pt understood.  

## 2014-04-02 NOTE — Telephone Encounter (Signed)
Please do not start Januvia or Metformin >> see my last visit instructions: Please decrease: - Lantus to 40 units at bedtime  Decrease: - Novolog to: 12 units with a smaller meal 15 units with a larger meal 18 units with a large meal Please return in 1 month with your sugar log.

## 2014-04-02 NOTE — Telephone Encounter (Signed)
Returned pt's call. Pt stated that her other dr sent in [place] for her to take, 25 mg. Pt states that she told he [facility] nurse that she is no longer taking Metformin. Pt stated she needs a refill of the Januvia. Please advise.

## 2014-04-02 NOTE — Telephone Encounter (Signed)
Called and spoke with patient, she didn't need anything

## 2014-04-06 ENCOUNTER — Other Ambulatory Visit: Payer: Self-pay | Admitting: *Deleted

## 2014-04-06 MED ORDER — INSULIN ASPART 100 UNIT/ML FLEXPEN: 12.0000 [IU] | PEN_INJECTOR | Freq: Three times a day (TID) | SUBCUTANEOUS | Status: DC

## 2014-04-07 DIAGNOSIS — M79609 Pain in unspecified limb: Secondary | ICD-10-CM | POA: Diagnosis not present

## 2014-04-09 ENCOUNTER — Telehealth: Payer: Self-pay | Admitting: Family Medicine

## 2014-04-10 ENCOUNTER — Encounter: Payer: Self-pay | Admitting: Internal Medicine

## 2014-04-10 ENCOUNTER — Ambulatory Visit (INDEPENDENT_AMBULATORY_CARE_PROVIDER_SITE_OTHER): Payer: Commercial Managed Care - HMO | Admitting: Internal Medicine

## 2014-04-10 VITALS — BP 108/58 | HR 80 | Temp 98.6°F | Resp 12 | Wt 169.6 lb

## 2014-04-10 DIAGNOSIS — IMO0001 Reserved for inherently not codable concepts without codable children: Secondary | ICD-10-CM

## 2014-04-10 DIAGNOSIS — E1165 Type 2 diabetes mellitus with hyperglycemia: Principal | ICD-10-CM

## 2014-04-10 DIAGNOSIS — E039 Hypothyroidism, unspecified: Secondary | ICD-10-CM | POA: Diagnosis not present

## 2014-04-10 DIAGNOSIS — E1065 Type 1 diabetes mellitus with hyperglycemia: Secondary | ICD-10-CM

## 2014-04-10 DIAGNOSIS — Z794 Long term (current) use of insulin: Principal | ICD-10-CM

## 2014-04-10 LAB — T4, FREE: FREE T4: 0.69 ng/dL (ref 0.60–1.60)

## 2014-04-10 LAB — TSH: TSH: 1.1 u[IU]/mL (ref 0.35–4.50)

## 2014-04-10 MED ORDER — INSULIN GLARGINE 300 UNIT/ML ~~LOC~~ SOPN: 40.0000 [IU] | PEN_INJECTOR | Freq: Every day | SUBCUTANEOUS | Status: DC

## 2014-04-10 NOTE — Patient Instructions (Signed)
Please stop Lantus and start Toujeo 40 units at bedtime. Increase NovoLog: 14 units with a small meal 16 units with a regular meal 18 units with a large meal  Please stop at the lab.  Please come back for a follow-up appointment in 2 months with your log.

## 2014-04-10 NOTE — Progress Notes (Signed)
Patient ID: [name], female   DOB: 29-Jul-1950, 64 y.o.   MRN: [id number]  HPI: [name] is a 64 y.o.-year-old female, initially referred by her PCP, Dr. Moshe [name], for management of DM2, dx 1995, insulin-dependent since 1997, uncontrolled, with complications (gastroparesis, cerebrovasc. Ds-  H/o stroke, PN). Last visit 2 mo ago.  DM2: Last hemoglobin A1c was: 10/28/2013: HbA1c 8.5% Lab Results  Component Value Date   HGBA1C 7.6* 03/11/2014   HGBA1C 8.3* 08/06/2013   HGBA1C 7.2* 04/11/2013   CBG in the office at last visit 47 >> pt given Sprite >> we reduced her insulin doses.  She is now on: - Lantus 60 >> 40 units at bedtime  - Novolog: 14 >> 12 units with a smaller meal 17 >> 14 units with a larger meal 20 >> 18 units with a large meal Ends up with:  14 with b'fast 14 with lunch 12-14 with dinner Could not tolerate Metformin >> nausea.   Pt checks her sugars 4 a day and they are: - am: 153, 200-300, some 400s >> 68-142, 164 >> 89-139, 161 - 2h after b'fast: n/c  - before lunch: 192-243 >> 78-192 >> 134-202 - 2h after lunch: n/c - before dinner: 200-292 >> 55 x1, 77-174 >> 91, 100-201 - 2h after dinner: n/c - bedtime: 190-200s, rarely 300s >> 72, 94-210 >> 90, 103-198 - nighttime: n/c No lows. Lowest sugar was 69 x1; she has hypoglycemia awareness at 70.  Highest sugar was 200s.  Pt's meals are: - Breakfast: eggs, cereals, fruit, oatmeal, bacon - Lunch: sandwich, beef, mashed potatoes,diet soda - Dinner: chicken, beef, fish, potatoes, vegetables - Snacks: 1-2: fruit  She exercises 1x a day - walks.  - + mild CKD, last BUN/creatinine:  Lab Results  Component Value Date   BUN 23 12/19/2013   CREATININE 1.04 12/19/2013  On Losartan. - last set of lipids: Lab Results  Component Value Date   CHOL 171 12/19/2013   HDL 45 12/19/2013   LDLCALC 78 12/19/2013   TRIG 240* 12/19/2013   CHOLHDL 3.8 12/19/2013  On Ezetimibe, was on Crestor, but stopped.  - last  eye exam was in 09/2013. No DR. Had cataract sx B. - + numbness and tingling in her leg L leg (affected by stroke) but also in R. Sees podiatry.  Pt has FH of DM in mother, father, brothers.  Hypothyroidism: - dx > 10 years ago. - On Levoxyl 50 mcg daily:  - in am (at 7 am) - fasting - with water - >30 min b'f b'fast - no Ca - no Fe - + MVI 5 hrs later - + Aciphex moved with lunch at last visit  Last TSH:  Lab Results  Component Value Date   TSH 1.453 08/02/2011   TSH 0.666 09/14/2009   TSH 0.295  05/30/2009   TSH 1.126 03/18/2009   TSH 0.104  09/15/2008   TSH 0.066  09/14/2008   TSH 1.041  02/03/2008   TSH 0.992 09/25/2007   TSH 0.257 08/25/2007   TSH 1.181 05/23/2006   She has a h/o thyroid nodules. + dysphagia, + hoarseness.  Reviewed thyroid U/S (10/2013):  Right thyroid lobe: 3.7 x 1.0 x 1.8 cm   Left thyroid lobe: 3.5 x 1.5 x 1.6 cm   Isthmus: 0.5 cm   Focal nodules: There is a 0.3 mm calcified nodule in the right midzone. There is a 0.6 x 0.7 x 0.5 cm hypoechoic nodule in the  lateral aspect of  the right midzone. There is a 0.7 x 0.6 x 0.5 cm hyperechoic nodule in the lateral aspect of the right lower pole.  There is a 2.2 x 1.1 x 1.7 cm complex solid nodule in the inferior aspect of the isthmus extending adjacent to the left lower pole.  There is a 1.4 x 2.1 x 1.4 cm complex solid nodule in the left lower pole.   Lymphadenopathy: None visualized.  Repeat U/S (01/2014): stable appearance of nodules  FNA (11/05/2013) x2: benign    ROS: Constitutional: no weight gain/loss, + fatigue, + subjective hypothermia, + nocturia Eyes: no blurry vision, no xerophthalmia ENT: no sore throat, no nodules palpated in throat, no dysphagia/odynophagia, no hoarseness, + tinnitus Cardiovascular: no CP/SOB/palpitations/leg swelling Respiratory: no cough/SOB Gastrointestinal: no N/V/D/+ C Musculoskeletal: no muscle/joint aches Skin: no rashes Neurological: no  tremors/numbness/tingling/dizziness  I reviewed pt's medications, allergies, PMH, social hx, family hx, and changes were documented in the history of present illness. Otherwise, unchanged from my initial visit note. Past Medical History  Diagnosis Date  . Bipolar disorder   . CVA (cerebral infarction)   . Pancreatitis 2006    due to Depakote with normal EUS   . Osteoporosis   . Chronic back pain   . Diabetes mellitus   . Trigger finger   . Anxiety disorder   . Hypertension   . Migraines     chronic headaches  . Diverticulosis     TCS 9/08 by Dr. Delfin [name] for diarrhea . Bx for micro scopic colitis negative.   . Schatzki's ring     non critical / EGD with ED 8/2011with RMR  . S/P colonoscopy [id number], 2011    left-sided diverticula, hx of simple adenomas . 2011, random bx negative for microscopic colitis  . Glaucoma   . Allergic rhinitis   . Hypothyroidism     thyroid goiter  . Anemia   . Blood transfusion   . GERD (gastroesophageal reflux disease)   . Stroke     left sided weakness  . Seizures     unknown etiology-on meds-last seizure was 3 yerars ago  . Anxiety   . Depression   . Metabolic encephalopathy 03/14/5850  . Sleep apnea     on CPAP  . Arthritis   . Gum symptoms     infection on antibiotic  . Chronic neck pain   . Mononeuritis lower limb   . Frequent falls    Past Surgical History  Procedure Laterality Date  . Abdominal hysterectomy  1978  . Cholecystectomy  1984  . Ovarian cyst removal    . Carpal tunnel release Left 07/22/04    Dr. Aline [name]  . Breast reduction surgery  1994  . Cataract extraction Bilateral   . Biopsy thyroid  2009  . Surgical excision of 3 tumors from right thigh and right buttock  and left upper thigh  2010  . Back surgery  July 2012  . Spine surgery  09/29/2010    Dr. Rolena [name]  . Maloney dilation  12/29/2010    RMR;  . Esophagogastroduodenoscopy  12/29/2010    [name]-Retained food in the esophagus and stomach, small hiatal  hernia, status post Maloney dilation of the esophagus  . Craniotomy  11/23/2011    Procedure: CRANIOTOMY TUMOR EXCISION;  Surgeon: [name] Spangle, MD;  Location: [facility];  Service: Neurosurgery;  Laterality: N/A;  Craniotomy for tumor resection  . Brain surgery  11/2011    resection of meningioma  . Colonoscopy N/A 09/25/2012  [id number] diverticulosis.  colonic polyp-removed : tubular adenoma  . Esophagogastroduodenoscopy N/A 09/25/2012    [id number] atonic baggy esophagus status post Maloney dilation 40 F. Hiatal hernia  . Givens capsule study N/A 01/15/2013    NORMAL.   . Bacterial overgrowth test N/A 05/05/2013    Procedure: BACTERIAL OVERGROWTH TEST;  Surgeon: Daneil Dolin, MD;  Location: [facility];  Service: Endoscopy;  Laterality: N/A;  7:30  . Transthoracic echocardiogram  2010    EF 60-65%, mild conc LVH, grade 1 diastolic dysfunction; mildly calcified MV annulus with mildly thickened leaflets, mildly calcified MR annulus  . Nm myocar perf wall motion  2006    "relavtiely normal" persantine, mild anterior thinning (breast attenuation artifact), no region of scar/ischemia  . Cardiac catheterization  05/10/2005    normal coronaries, normal LV systolic function and EF (Dr. Jackie Plum)   History   Social History  . Marital Status: Divorced    Spouse Name: N/A    Number of Children: 1  . Years of Education: 12   Occupational History  . disabled     Social History Main Topics  . Smoking status: Current Every Day Smoker -- 0.25 packs/day for 7 years    Types: Cigarettes  . Smokeless tobacco: Never Used     Comment: "started back but off now for 3 months" (08/18/13)  . Alcohol Use: No     Comment:    . Drug Use: No   Current Outpatient Prescriptions on File Prior to Visit  Medication Sig Dispense Refill  . aspirin EC 81 MG tablet Take 81 mg by mouth daily.    . Cholecalciferol (D 5000) 5000 UNITS capsule Take 5,000 Units by mouth daily.    . cloNIDine (CATAPRES)  0.3 MG tablet TAKE 1 TABLET BY MOUTH EVERY EIGHT HOURS. ONCE AT 8AM, 4PM, AND 12 MIDNIGHT. 90 tablet 3  . diclofenac sodium (VOLTAREN) 1 % GEL Apply 2 g topically daily as needed (Pain).    [name] dicyclomine (BENTYL) 10 MG capsule Take 10 mg by mouth 3 (three) times daily before meals.     [name] glucose blood (ACCU-CHEK AVIVA PLUS) test strip Use to test blood sugar 4 times daily as instructed. Dx code: E10.65 400 each 3  . hydrochlorothiazide (HYDRODIURIL) 25 MG tablet Take 1 tablet (25 mg total) by mouth daily. 30 tablet 3  . HYDROmorphone (DILAUDID) 4 MG tablet Take 4 mg by mouth 3 (three) times daily as needed. For pain    . hydroxychloroquine (PLAQUENIL) 200 MG tablet Take 200 mg by mouth daily.    . insulin aspart (NOVOLOG FLEXPEN) 100 UNIT/ML FlexPen Inject 14-20 Units into the skin 3 (three) times daily with meals. (Patient taking differently: Inject 16 Units into the skin 3 (three) times daily with meals. ) 45 mL 1  . Insulin Glargine (LANTUS) 100 UNIT/ML Solostar Pen Inject 50 Units into the skin daily at 10 pm. 45 mL 1  . Insulin Pen Needle (UNIFINE PENTIPS) 31G X 6 MM MISC Use to inject insulin 4 times daily as directed. 390 each 3  .      . lamoTRIgine (LAMICTAL) 100 MG tablet Take 1.5 tablets (150 mg total) by mouth every morning. 45 tablet 5  . levothyroxine (LEVOXYL) 50 MCG tablet Take 50 mcg by mouth every morning.     [name] LORazepam (ATIVAN) 0.5 MG tablet Take 0.5 mg by mouth at bedtime.     [name] losartan (COZAAR) 100 MG tablet TAKE 1 TABLET BY MOUTH  ONCE DAILY. 30 tablet 3  . methocarbamol (ROBAXIN) 500 MG tablet Take 500 mg by mouth every morning.     . metoprolol (LOPRESSOR) 50 MG tablet TAKE 1 TABLET BY MOUTH TWICE DAILY. 60 tablet 5  . mirtazapine (REMERON) 30 MG tablet Take 30 mg by mouth at bedtime.     . mometasone-formoterol (DULERA) 100-5 MCG/ACT AERO Inhale 2 puffs into the lungs 2 (two) times daily. 1 Inhaler 0  . montelukast (SINGULAIR) 10 MG tablet TAKE ONE TABLET BY MOUTH ONCE  DAILY. 30 tablet 2  . Multiple Vitamins-Minerals (ONE-A-DAY 50 PLUS PO) Take 1 tablet by mouth daily.    . pregabalin (LYRICA) 75 MG capsule Take 75 mg by mouth 2 (two) times daily.     . RABEprazole (ACIPHEX) 20 MG tablet TAKE 1 TABLET BY MOUTH TWICE DAILY. (Patient not taking: Reported on 03/03/2014) 60 tablet 5  . RABEprazole (ACIPHEX) 20 MG tablet Take 20 mg by mouth every morning.    . RESTASIS 0.05 % ophthalmic emulsion Place 1 drop into both eyes 2 (two) times daily.     . rosuvastatin (CRESTOR) 5 MG tablet TAKE 1 TABLET BY MOUTH AT BEDTIME. (Patient taking differently: Take 5 mg by mouth at bedtime. TAKE 1 TABLET BY MOUTH AT BEDTIME.) 30 tablet 3  . sertraline (ZOLOFT) 100 MG tablet Take 100 mg by mouth every morning.     [name] spironolactone (ALDACTONE) 25 MG tablet TAKE ONE TABLET BY MOUTH DAILY. 30 tablet 3  . terbinafine (LAMISIL) 250 MG tablet Take 1 tablet by mouth daily.    [name] thioridazine (MELLARIL) 10 MG tablet Take 20 mg by mouth at bedtime.     . traZODone (DESYREL) 50 MG tablet Take 50 mg by mouth at bedtime.     [name] ZETIA 10 MG tablet TAKE ONE TABLET BY MOUTH ONCE DAILY. 30 tablet 2   No current facility-administered medications on file prior to visit.   Allergies  Allergen Reactions  . Cephalexin Hives  . Iron Nausea And Vomiting  . Milk-Related Compounds Other (See Comments)    Doesn't agree with stomach.   . Penicillins Hives  . Phenazopyridine Hcl Other (See Comments)    [place]     Family History  Problem Relation Age of Onset  . Heart attack Mother     HTN  . Pneumonia Father   . Kidney failure Father   . Pancreatic cancer Sister   . Diabetes Brother   . Hypertension Brother   . Colon cancer Neg Hx   . Anesthesia problems Neg Hx   . Hypotension Neg Hx   . Malignant hyperthermia Neg Hx   . Pseudochol deficiency Neg Hx   . Stroke Maternal Grandmother   . Heart attack Maternal Grandfather   . Cancer Sister   . Hypertension Son    PE: BP 108/58 mmHg   Pulse 80  Temp(Src) 98.6 F (37 C) (Oral)  Resp 12  Wt 169 lb 9.6 oz (76.93 kg)  SpO2 97% Wt Readings from Last 3 Encounters:  04/10/14 169 lb 9.6 oz (76.93 kg)  03/11/14 171 lb (77.565 kg)  02/23/14 169 lb 8 oz (76.885 kg)   Constitutional: overweight, in NAD Eyes: surgical pupils, EOMI, no exophthalmos ENT: moist mucous membranes, no thyromegaly, large thyroid nodule felt in isthmus, no cervical lymphadenopathy Cardiovascular: RRR, No MRG Respiratory: CTA B Gastrointestinal: abdomen soft, NT, ND, BS+ Musculoskeletal: no deformities, strength intact in all 4 Skin: moist, warm, no rashes Neurological: no tremor with outstretched  hands, DTR normal in all 4  ASSESSMENT: 1. DM2, insulin-dependent, uncontrolled, without complications - gastroparesis - cerebrovasc. Ds -  H/o stroke - PN  2. Hypothyroidism  3. MNG 01/20/2014 Thyroid U/S- felt one nodule enlarging >> new U/S report reviewed >> stable appearance of the nodules, except a small new nodule in isthmus: 1.2 cm  Right thyroid lobe  Measurements: 4.4 cm x 1.2 cm x 1.7 cm. Multiple right-sided nodules identified.  Each right-sided nodule demonstrates increased echogenicity, with the superior measuring 7 mm -8 mm, most inferior measuring 9 mm - 10 mm. A small, 3 mm focus of calcium with posterior shadowing is evident. There is also a mid nodule measuring 7 mm, which is echogenic.  Left thyroid lobe  Measurements: 5.1 cm x 2.1 cm x 2.3 cm. Dominant lesion at the inferior pole of left thyroid is again evident, which has been previously biopsied (10/29/2012). This nodule measures 1.8 cm x 1.7 cm x 2.5 cm. (Previous 1.4 cm x 2.1 cm x 1.4 cm)  Isthmus  Thickness: 4 mm-5 mm. Isthmic nodule again noted, previously biopsied (10/29/2012). Currently this nodule measures 2.2 cm x 1.4 cm x 1.8 cm. (previous measurement 2.2 cm x 1.1 cm x 1.7 cm).  There is a new nodule identified within the isthmus with heterogeneously  hyperechoic characteristics. This nodule measures 12 mm x 5.4 mm x 7.2 mm.  Lymphadenopathy None visualized.  PLAN:  1. Patient with long-standing, uncontrolled diabetes, on basal-bolus regimen, with fluctuating sugars, but no more sugars <60. Sugars before lunch and dinner are higher than goal >> will increase NovoLog. Will also switch from Lantus to Toujeo: Patient Instructions  Please stop Lantus and start Toujeo 40 units at bedtime. Increase NovoLog: 14 units with a small meal 16 units with a regular meal 18 units with a large meal  Please stop at the lab.  Please come back for a follow-up appointment in 2 months with your log.  - continue checking sugars at different times of the day - check 3-4 times a day, rotating checks - advised for yearly eye exams >> she is UTD - had the flu vaccine this season - check A1c at next visit - Return to clinic in 2 mo with sugar log   2. Hypothyroidism - no recent TFTs - we discussed how to take the thyroid hormone every day, with water, >30 minutes before breakfast, separated by >4 hours from acid reflux medications, calcium, iron, multivitamins. At last visit, we moved Aciphex from am to pm - we will check her TFTs today - continue LT4 50 mcg daily  3. MNG - previously had 2 normal Bx's (2014) - will need a new U/S in 1 year.  Office Visit on 04/10/2014  Component Date Value Ref Range Status  . TSH 04/10/2014 1.10  0.35 - 4.50 uIU/mL Final  . Free T4 04/10/2014 0.69  0.60 - 1.60 ng/dL Final  Great thyroid labs!

## 2014-04-11 DIAGNOSIS — G4733 Obstructive sleep apnea (adult) (pediatric): Secondary | ICD-10-CM | POA: Diagnosis not present

## 2014-04-13 ENCOUNTER — Encounter: Payer: Self-pay | Admitting: *Deleted

## 2014-04-13 DIAGNOSIS — F22 Delusional disorders: Secondary | ICD-10-CM | POA: Diagnosis not present

## 2014-04-13 NOTE — Telephone Encounter (Signed)
noted 

## 2014-04-14 DIAGNOSIS — L931 Subacute cutaneous lupus erythematosus: Secondary | ICD-10-CM | POA: Diagnosis not present

## 2014-04-20 DIAGNOSIS — M5440 Lumbago with sciatica, unspecified side: Secondary | ICD-10-CM | POA: Diagnosis not present

## 2014-04-21 DIAGNOSIS — E1051 Type 1 diabetes mellitus with diabetic peripheral angiopathy without gangrene: Secondary | ICD-10-CM | POA: Diagnosis not present

## 2014-04-22 ENCOUNTER — Telehealth: Payer: Self-pay | Admitting: Family Medicine

## 2014-04-22 ENCOUNTER — Ambulatory Visit (INDEPENDENT_AMBULATORY_CARE_PROVIDER_SITE_OTHER): Payer: Commercial Managed Care - HMO | Admitting: Family Medicine

## 2014-04-22 ENCOUNTER — Encounter: Payer: Self-pay | Admitting: Family Medicine

## 2014-04-22 VITALS — BP 120/70 | HR 69 | Resp 16 | Ht 59.0 in | Wt 171.0 lb

## 2014-04-22 DIAGNOSIS — IMO0001 Reserved for inherently not codable concepts without codable children: Secondary | ICD-10-CM

## 2014-04-22 DIAGNOSIS — J309 Allergic rhinitis, unspecified: Secondary | ICD-10-CM

## 2014-04-22 DIAGNOSIS — E785 Hyperlipidemia, unspecified: Secondary | ICD-10-CM | POA: Diagnosis not present

## 2014-04-22 DIAGNOSIS — F3131 Bipolar disorder, current episode depressed, mild: Secondary | ICD-10-CM

## 2014-04-22 DIAGNOSIS — E1065 Type 1 diabetes mellitus with hyperglycemia: Secondary | ICD-10-CM

## 2014-04-22 DIAGNOSIS — E038 Other specified hypothyroidism: Secondary | ICD-10-CM

## 2014-04-22 DIAGNOSIS — J329 Chronic sinusitis, unspecified: Secondary | ICD-10-CM | POA: Diagnosis not present

## 2014-04-22 DIAGNOSIS — Z1231 Encounter for screening mammogram for malignant neoplasm of breast: Secondary | ICD-10-CM | POA: Diagnosis not present

## 2014-04-22 DIAGNOSIS — J449 Chronic obstructive pulmonary disease, unspecified: Secondary | ICD-10-CM | POA: Diagnosis not present

## 2014-04-22 DIAGNOSIS — E1165 Type 2 diabetes mellitus with hyperglycemia: Secondary | ICD-10-CM

## 2014-04-22 DIAGNOSIS — F17208 Nicotine dependence, unspecified, with other nicotine-induced disorders: Secondary | ICD-10-CM

## 2014-04-22 DIAGNOSIS — H9203 Otalgia, bilateral: Secondary | ICD-10-CM

## 2014-04-22 DIAGNOSIS — M810 Age-related osteoporosis without current pathological fracture: Secondary | ICD-10-CM

## 2014-04-22 DIAGNOSIS — Z794 Long term (current) use of insulin: Secondary | ICD-10-CM

## 2014-04-22 DIAGNOSIS — I10 Essential (primary) hypertension: Secondary | ICD-10-CM

## 2014-04-22 DIAGNOSIS — F3160 Bipolar disorder, current episode mixed, unspecified: Secondary | ICD-10-CM

## 2014-04-22 DIAGNOSIS — M5010 Cervical disc disorder with radiculopathy, unspecified cervical region: Secondary | ICD-10-CM

## 2014-04-22 MED ORDER — SULFAMETHOXAZOLE-TRIMETHOPRIM 800-160 MG PO TABS: 1.0000 | ORAL_TABLET | Freq: Two times a day (BID) | ORAL | Status: DC

## 2014-04-22 NOTE — Progress Notes (Signed)
Subjective:    Patient ID: [name], female    DOB: 08-Jun-1950, 64 y.o.   MRN: [id number]  HPI Bilateral ear pain has appt with ENT in am, bilateral cerumen impaction present  1 week h/o sinus pressure and green drainage, intermittent chills , no documented fever, no productive cough or sore throat  Left leg weak and numb, being followed by pain medicine and has had lumbar surgery , with no immediate plan for rept surgery  \Requests referral to new psychiatrist, feels as though sh is not getting the help that she needs, has been discontented since her original psych Dr Theda Sers retied approx 2 years ago. Not currently suicidal or homicidal  Follows with endo, still working on improving her blood sugar control Denies polyuria, polydipsia, blurred vision , or hypoglycemic episodes.    Review of Systems See HPI t. Denies chest congestion, productive cough or wheezing. Denies chest pains, palpitations , c/o intermittent  leg swelling Denies abdominal pain, nausea, vomiting,diarrhea or constipation.   Denies dysuria, frequency, hesitancy or incontinence. Chronic joint pain, swelling and limitation in mobility.  uncontrolled headaches, seizures, numbne, anxiety or insss, or tingling.  Denies skin break down or rash.        Objective:   Physical Exam BP 120/70 mmHg  Pulse 69  Resp 16  Ht 4\' 11"  (1.499 m)  Wt 171 lb (77.565 kg)  BMI 34.52 kg/m2  SpO2 96% Patient alert and oriented and in no cardiopulmonary distress.  HEENT: No facial asymmetry, EOMI,   oropharynx pink and moist.  Neck supple no JVD, no mass. Tm bilaterally occluded with cerumen Frontal and maxillary sinuses tender, no cervical adenopathy, nasal mucosa not erythematous or edematous Chest: Clear to auscultation bilaterally.Decreased though adequate air entry   CVS: S1, S2 no murmurs, no S3.Regular rate.  ABD: Soft non tender.   Ext: No edema  MS: Adequate ROM spine, shoulders, hips and  knees.  Skin: Intact, no ulcerations or rash noted.  Psych: Good eye contact, normal affect. Memory intact not anxious or depressed appearing.  CNS: CN 2-12 intact, power,  normal throughout.no focal deficits noted.        Assessment & Plan:  Sinusitis, chronic Antibiotic course prescribed, and saline nasal flushes encouraged   Essential hypertension Controlled, no change in medication    Allergic sinusitis Controlled, no change in medication Takes daily singulair    COPD (chronic obstructive pulmonary disease) with chronic bronchitis Anticipate worsening disease with ongoing nicotine use Tobacco cessation counseling done, she is struggling with this She is to continue to follow with pulmonary Doc   Diabetes mellitus, insulin dependent (IDDM), uncontrolled Patient advised to reduce carb and sweets, commit to regular physical activity, take meds as prescribed, test blood as directed, and attempt to lose weight, to improve blood sugar control. Followed by endo   Hypothyroidism Controlled, and followed by endo   Cervical disc disorder with radiculopathy of cervical region Chronic pain management through pain center   Bipolar disorder Requests changing to new psychiatrist. Was followed for years at mental health but after psychiatrist Dr Theda Sers retired, she reports ongoing need to change from her current psychiatrist. Not actively suicidal or homicidal Will see if [facility] psych will accept her, special msg to psychiatrist also Pt is compliant with her appts and concerned about her health   Hyperlipidemia Uncontrolled Hyperlipidemia:Low fat diet discussed and encouraged.  Updated lab needed at/ before next visit.    Nicotine dependence Unchanged, wants to quit but finds  this very challenging Patient counseled for approximately 5 minutes regarding the health risks of ongoing nicotine use, specifically all types of cancer, heart disease, stroke and respiratory  failure. The options available for help with cessation ,the behavioral changes to assist the process, and the option to either gradully reduce usage  Or abruptly stop.is also discussed. Pt is also encouraged to set specific goals in number of cigarettes used daily, as well as to set a quit date.    Otalgia of both ears Bilateral cerumen impaction, has am appt with ENT for irigation

## 2014-04-22 NOTE — Patient Instructions (Addendum)
Annual wellness in 3 month, call if you need me ebfore  Antibiotic sent in , septra for sinus infection  When your ears are irrigated tomorrow I know they will hurt less  Fasting lipid,cmp and EGFr , CBC in 3 month and vit D  You are referred to breast center for mammogram and also to psychiatrist at [facility], if they will se you there  Continue to cut back on stopping smoking

## 2014-04-22 NOTE — Telephone Encounter (Signed)
Taking longer than 48 hours to start of care for patient have already set up 2 times with an appointment  and patient asks him to reschedule and just wanted to let you know it is taking more than 48 hours if any questions please call him back he is from [employer]

## 2014-04-23 ENCOUNTER — Ambulatory Visit (INDEPENDENT_AMBULATORY_CARE_PROVIDER_SITE_OTHER): Payer: Commercial Managed Care - HMO | Admitting: Otolaryngology

## 2014-04-23 DIAGNOSIS — R430 Anosmia: Secondary | ICD-10-CM

## 2014-04-23 DIAGNOSIS — J310 Chronic rhinitis: Secondary | ICD-10-CM | POA: Diagnosis not present

## 2014-04-23 DIAGNOSIS — H6123 Impacted cerumen, bilateral: Secondary | ICD-10-CM

## 2014-04-23 DIAGNOSIS — G4733 Obstructive sleep apnea (adult) (pediatric): Secondary | ICD-10-CM | POA: Diagnosis not present

## 2014-04-24 MED ORDER — SULFAMETHOXAZOLE-TRIMETHOPRIM 800-160 MG PO TABS: 1.0000 | ORAL_TABLET | Freq: Two times a day (BID) | ORAL | Status: DC

## 2014-04-27 ENCOUNTER — Encounter: Payer: Self-pay | Admitting: Family Medicine

## 2014-04-27 DIAGNOSIS — H9203 Otalgia, bilateral: Secondary | ICD-10-CM | POA: Insufficient documentation

## 2014-04-27 NOTE — Assessment & Plan Note (Signed)
Controlled, no change in medication Takes daily singulair

## 2014-04-27 NOTE — Assessment & Plan Note (Signed)
Patient advised to reduce carb and sweets, commit to regular physical activity, take meds as prescribed, test blood as directed, and attempt to lose weight, to improve blood sugar control. Followed by endo

## 2014-04-27 NOTE — Assessment & Plan Note (Signed)
Antibiotic course prescribed, and saline nasal flushes encouraged

## 2014-04-27 NOTE — Assessment & Plan Note (Signed)
Uncontrolled. Hyperlipidemia:Low fat diet discussed and encouraged.  Updated lab needed at/ before next visit.  

## 2014-04-27 NOTE — Assessment & Plan Note (Signed)
Requests changing to new psychiatrist. Was followed for years at mental health but after psychiatrist Dr Theda Sers retired, she reports ongoing need to change from her current psychiatrist. Not actively suicidal or homicidal Will see if [facility] psych will accept her, special msg to psychiatrist also Pt is compliant with her appts and concerned about her health

## 2014-04-27 NOTE — Assessment & Plan Note (Signed)
Chronic pain management through pain center

## 2014-04-27 NOTE — Assessment & Plan Note (Signed)
Anticipate worsening disease with ongoing nicotine use Tobacco cessation counseling done, she is struggling with this She is to continue to follow with pulmonary Doc

## 2014-04-27 NOTE — Assessment & Plan Note (Signed)
Controlled, and followed by endo

## 2014-04-27 NOTE — Assessment & Plan Note (Signed)
Bilateral cerumen impaction, has am appt with ENT for irigation

## 2014-04-27 NOTE — Assessment & Plan Note (Signed)
Controlled, no change in medication  

## 2014-04-27 NOTE — Assessment & Plan Note (Signed)
Unchanged, wants to quit but finds this very challenging Patient counseled for approximately 5 minutes regarding the health risks of ongoing nicotine use, specifically all types of cancer, heart disease, stroke and respiratory failure. The options available for help with cessation ,the behavioral changes to assist the process, and the option to either gradully reduce usage  Or abruptly stop.is also discussed. Pt is also encouraged to set specific goals in number of cigarettes used daily, as well as to set a quit date.

## 2014-04-30 ENCOUNTER — Other Ambulatory Visit: Payer: Self-pay | Admitting: Family Medicine

## 2014-05-04 DIAGNOSIS — I639 Cerebral infarction, unspecified: Secondary | ICD-10-CM | POA: Diagnosis not present

## 2014-05-04 DIAGNOSIS — E119 Type 2 diabetes mellitus without complications: Secondary | ICD-10-CM | POA: Diagnosis not present

## 2014-05-04 DIAGNOSIS — M150 Primary generalized (osteo)arthritis: Secondary | ICD-10-CM | POA: Diagnosis not present

## 2014-05-04 DIAGNOSIS — G4733 Obstructive sleep apnea (adult) (pediatric): Secondary | ICD-10-CM | POA: Diagnosis not present

## 2014-05-05 ENCOUNTER — Other Ambulatory Visit: Payer: Self-pay | Admitting: *Deleted

## 2014-05-05 ENCOUNTER — Ambulatory Visit (INDEPENDENT_AMBULATORY_CARE_PROVIDER_SITE_OTHER)
Admission: RE | Admit: 2014-05-05 | Discharge: 2014-05-05 | Disposition: A | Discharge status: Home / Self Care | Payer: Commercial Managed Care - HMO | Source: Ambulatory Visit | Attending: Internal Medicine | Admitting: Internal Medicine

## 2014-05-05 ENCOUNTER — Encounter: Payer: Self-pay | Admitting: Internal Medicine

## 2014-05-05 ENCOUNTER — Ambulatory Visit (INDEPENDENT_AMBULATORY_CARE_PROVIDER_SITE_OTHER): Payer: Commercial Managed Care - HMO | Admitting: Internal Medicine

## 2014-05-05 VITALS — BP 158/70 | HR 75 | Ht 59.0 in | Wt 164.0 lb

## 2014-05-05 DIAGNOSIS — J449 Chronic obstructive pulmonary disease, unspecified: Secondary | ICD-10-CM | POA: Diagnosis not present

## 2014-05-05 DIAGNOSIS — Z720 Tobacco use: Secondary | ICD-10-CM | POA: Diagnosis not present

## 2014-05-05 DIAGNOSIS — J984 Other disorders of lung: Secondary | ICD-10-CM | POA: Diagnosis not present

## 2014-05-05 DIAGNOSIS — G4733 Obstructive sleep apnea (adult) (pediatric): Secondary | ICD-10-CM

## 2014-05-05 DIAGNOSIS — F17200 Nicotine dependence, unspecified, uncomplicated: Secondary | ICD-10-CM | POA: Diagnosis not present

## 2014-05-05 DIAGNOSIS — F17210 Nicotine dependence, cigarettes, uncomplicated: Secondary | ICD-10-CM | POA: Diagnosis not present

## 2014-05-05 MED ORDER — LEVOTHYROXINE SODIUM 50 MCG PO TABS: 50.0000 ug | ORAL_TABLET | ORAL | Status: DC

## 2014-05-05 MED ORDER — VARENICLINE TARTRATE 0.5 MG X 11 & 1 MG X 42 PO MISC: ORAL | Status: DC

## 2014-05-05 NOTE — Patient Instructions (Addendum)
Script printed to try Chantix  Aim to quit smoking 1 week after you start the Chantix, so it has time to start in your system  If you feel that the Chantix is affecting your mind or your mood, then stop it immediately. Hopefully it won't cause a problem.   Order CXR   Dx tobacco user

## 2014-05-05 NOTE — Assessment & Plan Note (Signed)
We discussed potential destabilization of her mental health but she is willing to risk a trial of Chantix to help her stop smoking. Plan-Chantix with instructions to stop immediately if she feels her mood slipping.

## 2014-05-05 NOTE — Assessment & Plan Note (Signed)
Stable symptoms without distinct exacerbation. The key is smoking cessation and she understands that.

## 2014-05-05 NOTE — Assessment & Plan Note (Signed)
CPAP 8/[facility] is medically necessary and remains helpful. Compliance good

## 2014-05-05 NOTE — Progress Notes (Signed)
06/18/12- 71 yoF referred courtesy of Dr Joycelyn Schmid [name]-sleep study last year per patient. She reports that she was witnessed stopping breathing in sleep, has a history of loud snoring and is a restless sleeper with frequent waking at night, bathroom trips and nightmares. Easy dyspnea on exertion during the day. She does smoke. New cough and sinus pressure this week attributed to "allergy" with green nasal discharge and no headache. She was sent to hospital April 1 when she says she couldn't wake up while at her doctor's office. She sleeps propped up. Gets sleepy in 30 minutes watching TV during the day. Bedtime 10 PM, sleep latency one or 2 hours, waking during the night 3 or 4 times before up at 8 AM. She had a sleep study at [facility] and was given CPAP but says she can't use it because the machine started smoking and she didn't like the pressure. She was changed from [place] to [place]. She is living alone, divorced and disabled. Son has OSA. History of meningioma 11/23/2011. No seizure in over 3 years. Does not drive. NPSG 3/8/13[name]- mild obstructive sleep apnea, AHI 14 per hour, mostly in REM. Epworth sleepiness score 14/24. Weight 164 pounds.  09/26/12- 40 yoF  Former smoker followed for obstructive sleep apnea follows for:  review download from [facility] with pt today.  pt stated that her back has been hurting her.   Did stop smoking! Some wheeze with exertion. She complains that she is falling asleep before she puts her CPAP/ auto/ [employer] on. Download demonstrated poor compliance and control. She had complained of a long history of difficulty falling asleep. Says a neurologist had questioned narcolepsy. She denies cataplexy or sleep paralysis.  11/19/12- 77 yoF  Former smoker followed for obstructive sleep apnea, ? Lung nodule, complicated by hx resection of meningioma FOLLOWS FOR:  Wearing CPAP 8/ [facility] 6-8 hours per night.  Discuss lung nodule was told she had by Dr.  Tula Nakayama Sleep is comfortable with CPAP. Nuvigil sample did not help with daytime alertness. There was questionable lung nodule but not seen on recent CT chest. Incidental slip in bathroom last week- black eye. CT 08/19/12 IMPRESSION:  The opacity in the inferior segment of the lingula of the left  upper lobe is without significant change. It is morphology is  consistent with scarring and / or atelectasis. This does not have  the configuration of a nodule. Other minor areas of reticular  subsegmental atelectasis are stable as well. There is no discrete  lung nodule. The lungs are otherwise clear.  No additional short-term follow-up is felt indicated. There are no  findings suspicious for neoplastic disease.  Original Report Authenticated By: Lajean Manes, M.D.  05/21/13- 56 yoF  Former smoker followed for obstructive sleep apnea, R/O'd Lung nodule, complicated by hx resection of meningioma FOLLOWS FOR: has been using old CPAP machine(has been waiting for [facility]  to get her outlets changed over for her new CPAP machine). They have done this and still using old CPAP. Pt states Dr Moshe Cipro wants her to have another sleep study. NPSG 05/29/11- AHI 14/ hr CPAP 8/ [facility] Always sleepy with bedtime between 8 and 9 PM, at 7:30 AM. At least twice during the night. "addicted to Ativan"-withdrawal if she skips it. CT chest 12/16/12 IMPRESSION:  Minimal chronic atelectasis versus scarring at the bases of left  lower lobe and lingula.  Otherwise negative exam, with no evidence of pulmonary nodule.  No further workup required.  Electronically Signed  By: Lavonia Dana M.D.  On: 12/16/2012 12:09  08/12/13- 50 yoF Former smoker followed for obstructive sleep apnea, R/O'd Lung nodule, complicated by hx resection of meningioma. Bipolar, GERD, DM Follows for:  Pt here to discuss results of her slpit night sleep study. She is still using her old CPAP machine and is waking up several times per night.    09/23/13- 89 yoF Former smoker followed for obstructive sleep apnea, R/O'd Lung nodule, complicated by hx resection of meningioma. Bipolar, GERD, DM FOLLOWS FOR: Pt states that she is trying to use CPAP 8/ [facility] as much as possible. Only ranging between 2-5hrs at night. Pt states that mask does not fit well. Pt reports that Nuvigil did not work for her. Pt c/o some chest pressure, wheezing, SOB and fatigue.   10/30/13- 78 yoF Former smoker followed for obstructive sleep apnea with hypersomnia, R/O'd Lung nodule, complicated by hx resection of meningioma. Bipolar, GERD, DM Referred by Dr Moshe Cipro for new problem of wheezing dyspnea Remote history of pneumonia with no history of asthma or other known lung disease. She had smoked for 15 or 20 years, stopping in 2014. The patient complains of shortness of breath with exertion and wheezing noted especially in the last 4 weeks with no significant triggering event. She does not recognize that she had an infection. She will wheeze some sitting quietly but has not noticed wheezing lying down, when she is wearing her CPAP. CPAP 8/ [facility]- All night every night Gets "strangled" occasionally trying to swallow a pill or food. Has some history of reflux Office Spirometry 10/30/2013-submaximal effort based on appearance of loop and curve. Numbers would fit with severe restriction but her physiologic capability may be better than this. FVC 0.91/44%, FEV1 0.74/45%, FEV1/FVC 0.81, FEF 25-75% 1.43/69%. CXR 09/15/13 IMPRESSION:  The study is limited due to hypoinflation. Minimal infrahilar  atelectasis on the right is suspected.  Electronically Signed  By: David Martinique  On: 09/15/2013 12:45  12/26/13- 68 yoF Former smoker followed for obstructive sleep apnea with hypersomnia, , R/O'd Lung nodule, complicated by hx resection of meningioma. Bipolar, GERD, DM FOLLOWS FOR:  breathing is worse without the inhaler.  increase SOB and wheezing with exertion.  She feels she  is doing better. Less sleepy .Dulera inhaler does help, needs refill. Has had flu and pneumonia vaccines. CXR 10/30/13 IMPRESSION:  Chronic RIGHT basilar atelectasis.  Electronically Signed  By: Lavonia Dana M.D.  On: 10/30/2013 16:57  05/05/14-  23 yoF Former smoker followed for obstructive sleep apnea with hypersomnia, , R/O'd Lung nodule, complicated by hx resection of meningioma. Bipolar, GERD, DM FOLLOWS FOR: Pt still gets sob, wheeze with exertion. She has a dry cough. Usually has some wheeze and cough most days. Wants further much to stop smoking and is interested in trying Chantix. We discussed possibility the drug would impact her mood/psychiatric problems. Nicotine patches did not work. Complains of sharp pain to right of sternum, persistent, localized but does go away at times. Continues CPAP 8/[facility] without problem, all night every night.  ROS-see HPI Constitutional:   No-   weight loss, night sweats, fevers, chills, +fatigue, lassitude. HEENT:  + headaches, +difficulty swallowing, tooth/dental problems, sore throat,       No-sneezing, itching, ear ache, nasal congestion, post nasal drip,  CV:  +chest pain, orthopnea, PND, swelling in lower extremities, anasarca, dizziness, +palpitations Resp: +  shortness of breath with exertion or at rest.  productive cough,  + non-productive cough,  No- coughing up of blood.              No-   change in color of mucus.  + wheezing.   Skin: No-   rash or lesions. GI:  +heartburn, +indigestion, no-abdominal pain, nausea, vomiting,  GU:  MS:  + joint pain or swelling.   Neuro-     nothing unusual Psych:  No- change in mood or affect. +depression or +anxiety.  No memory loss.  OBJ- Physical Exam - more alert than usual General-  Oriented, Affect-flat, Distress- none acute Skin- rash-none, lesions- none, excoriation- none Lymphadenopathy- none Head- Eyes- Gross vision intact, PERRLA, conjunctivae and secretions clear             Ears- Hearing, canals-normal            Nose- Clear, no-Septal dev, mucus, polyps, erosion, perforation             Throat- Mallampati III , mucosa clear , drainage- none, tonsils- atrophic, + dentures Neck- flexible , trachea midline, no stridor , thyroid nl, carotid no bruit Chest - symmetrical excursion , unlabored           Heart/CV- RRR , no murmur , no gallop  , no rub, nl s1 s2                           - JVD- none , edema- none, stasis changes- none, varices- none           Lung- clear to P&A, wheeze- none, cough+rattling , dullness-none, rub- none           Chest wall- + mild tenderness to firm pressure over right mid sternal costochondral joint Abd-  Br/ Gen/ Rectal- Not done, not indicated Extrem- cyanosis- none, clubbing, none, atrophy- none, strength- nl. +cane Neuro-

## 2014-05-06 DIAGNOSIS — M5416 Radiculopathy, lumbar region: Secondary | ICD-10-CM | POA: Diagnosis not present

## 2014-05-06 IMAGING — US US SOFT TISSUE HEAD/NECK
1 series · 14 of 25 positions shown · non-contrast
Comparison: Ultrasound dated 10/01/2007

CLINICAL DATA: Thyroid nodules.

THYROID ULTRASOUND
TECHNIQUE: Ultrasound examination of the thyroid gland and adjacent
soft tissues was performed.

[Series 1: us soft tissue head/neck · 0.07mm/px · 14 of 89 slices shown]
[im 1/89]
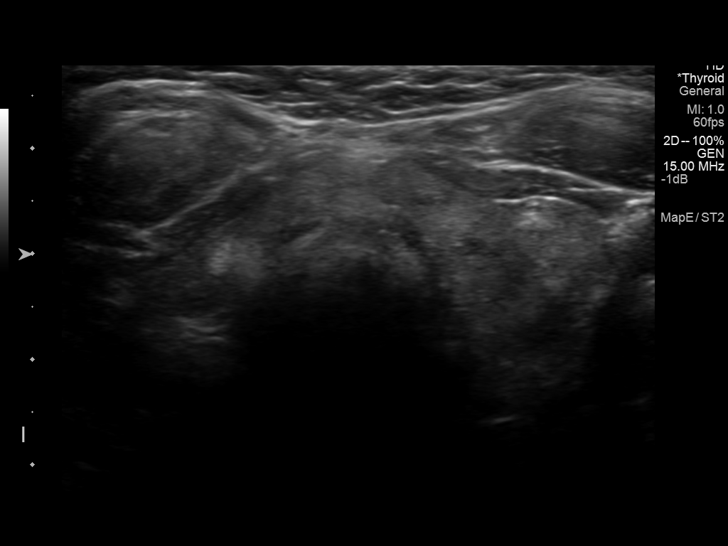
[im 8/89]
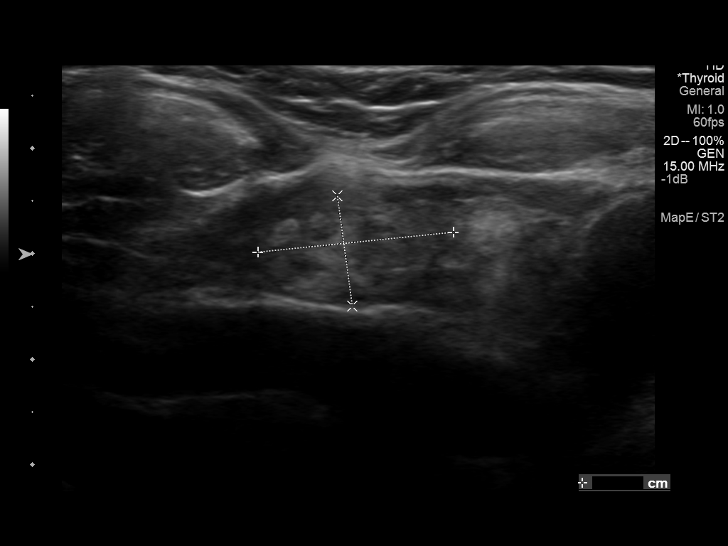
[im 15/89]
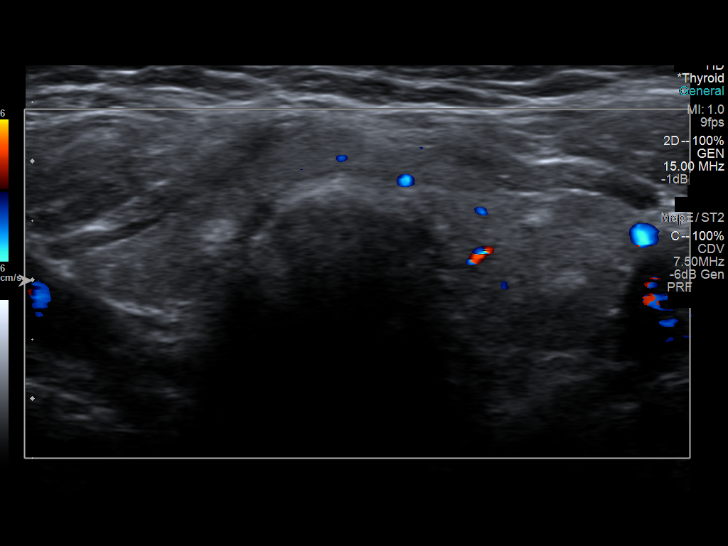
[im 23/89]
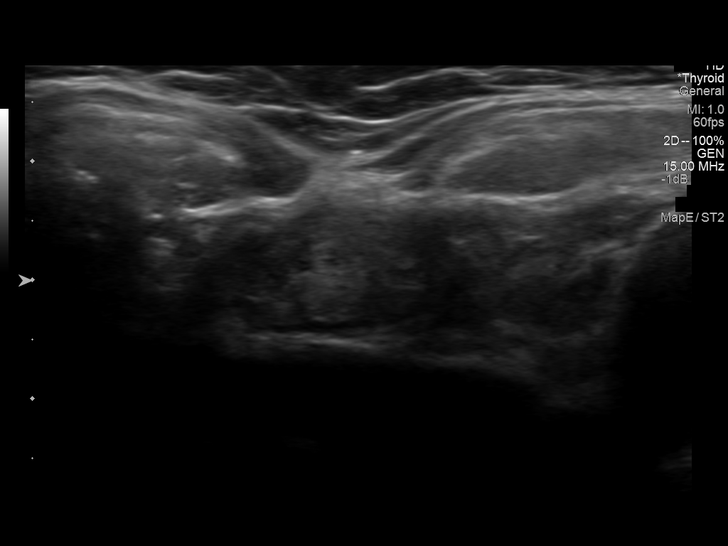
[im 30/89]
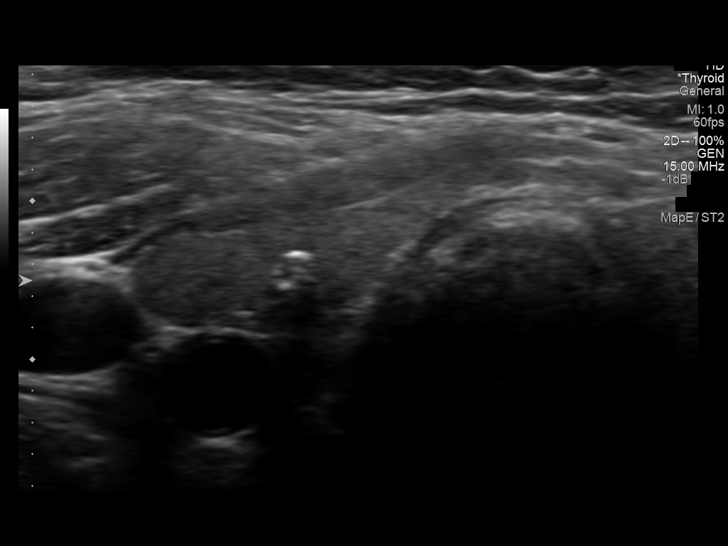
[im 34/89]
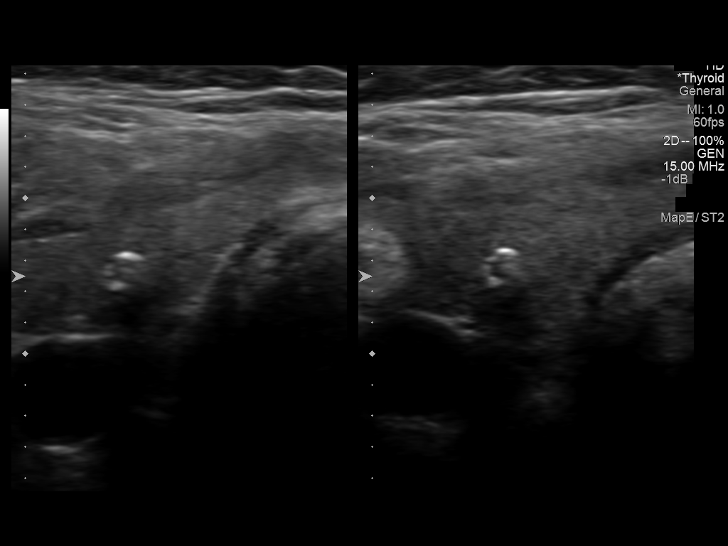
[im 41/89]
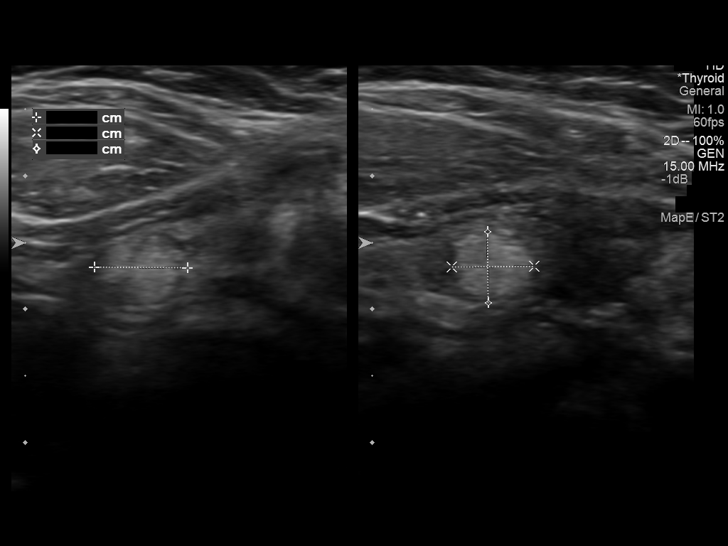
[im 48/89]
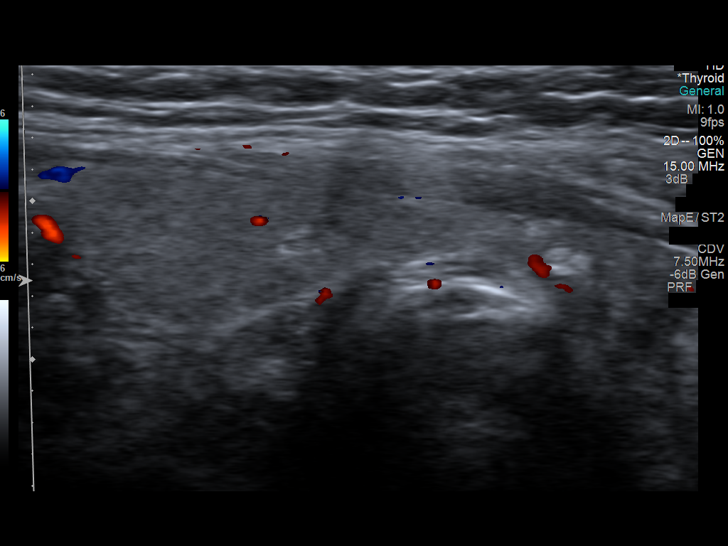
[im 56/89]
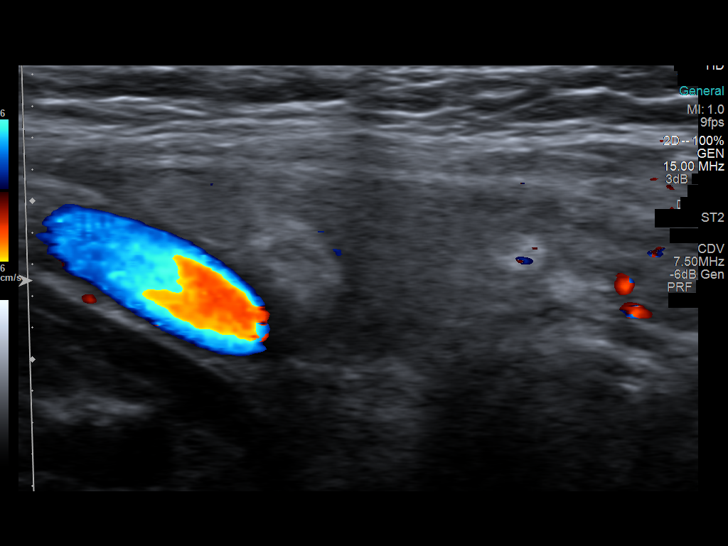
[im 59/89]
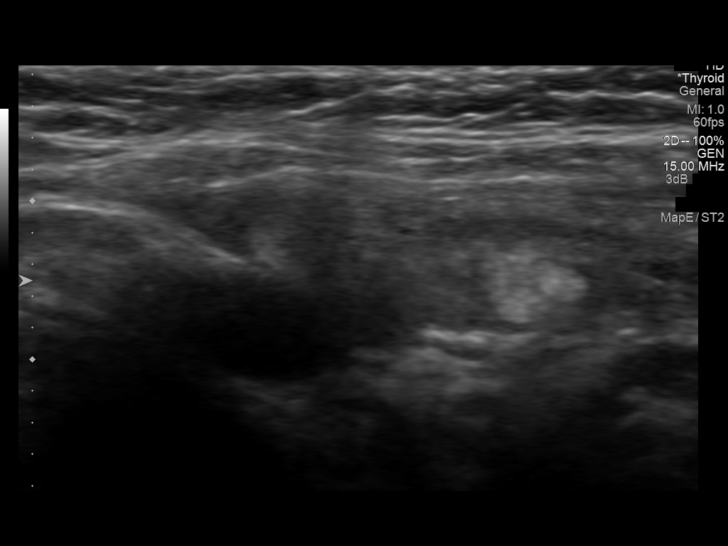
[im 67/89]
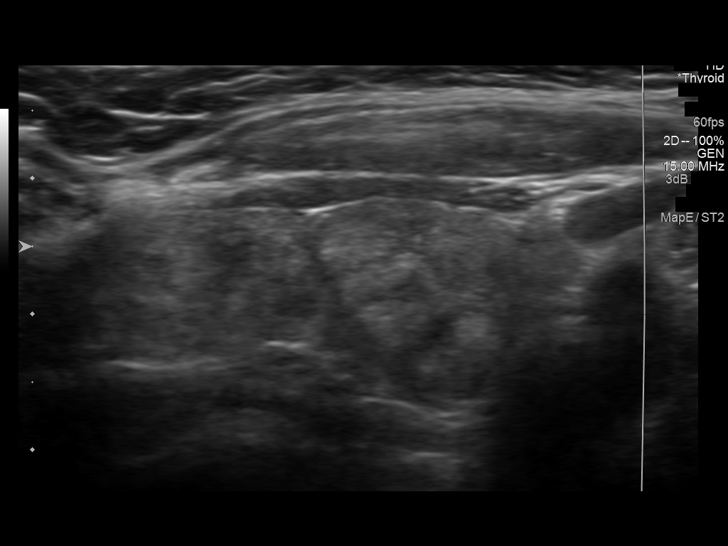
[im 74/89]
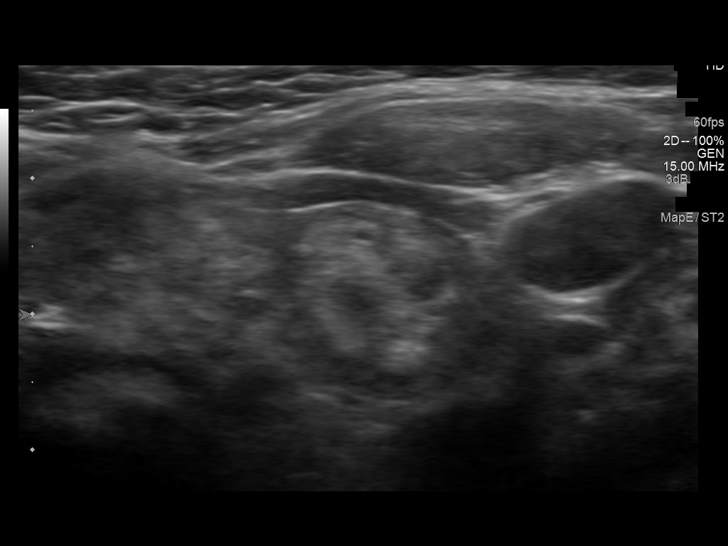
[im 81/89]
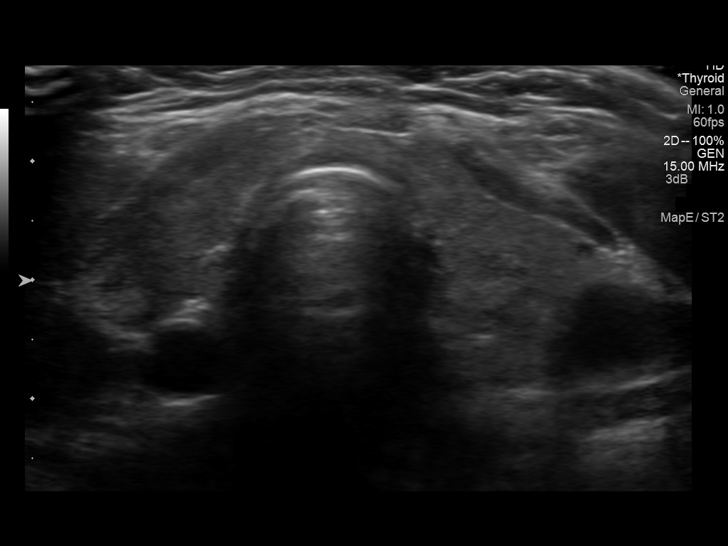
[im 89/89]
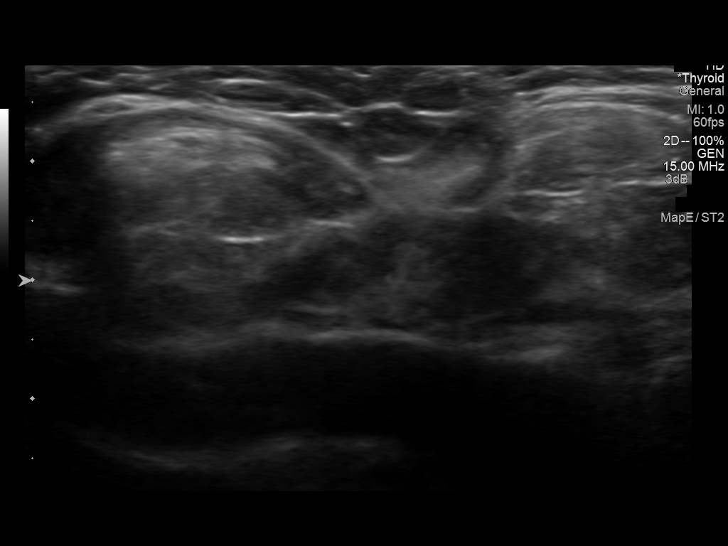

[14 of 25 positions shown; findings below may reference images not displayed]

FINDINGS: Right thyroid lobe:  3.7 x 1.0 x 1.8 cm
Left thyroid lobe:   3.5 x 1.5 x 1.6 cm
Isthmus:  0.5 cm

Focal nodules:  There is a 0.3 mm calcified nodule in the right
midzone.  There is a 0.6 x 0.7 x 0.5 cm hypoechoic nodule in the
lateral aspect of the right midzone.  There is a 0.7 x 0.6 x 0.5 cm
hyperechoic nodule in the lateral aspect of the right lower pole.

There is a 2.2 x 1.1 x 1.7 cm complex solid nodule in the inferior
aspect of the isthmus extending adjacent to the left lower pole.

There is a 1.4 x 2.1 x 1.4 cm complex solid nodule in the left
lower pole.

Lymphadenopathy:  None visualized.
IMPRESSION: Multi nodular goiter which has markedly progressed since the prior
exam.  The dominant nodule at the inferior aspect of the isthmus
fits criteria for fine needle aspiration biopsy if not previously
assessed.

## 2014-05-08 DIAGNOSIS — M79609 Pain in unspecified limb: Secondary | ICD-10-CM | POA: Diagnosis not present

## 2014-05-11 DIAGNOSIS — G4733 Obstructive sleep apnea (adult) (pediatric): Secondary | ICD-10-CM | POA: Diagnosis not present

## 2014-05-12 ENCOUNTER — Other Ambulatory Visit: Payer: Self-pay | Admitting: Family Medicine

## 2014-05-12 ENCOUNTER — Telehealth: Payer: Self-pay | Admitting: Family Medicine

## 2014-05-12 DIAGNOSIS — E119 Type 2 diabetes mellitus without complications: Secondary | ICD-10-CM | POA: Diagnosis not present

## 2014-05-12 DIAGNOSIS — Z794 Long term (current) use of insulin: Secondary | ICD-10-CM | POA: Diagnosis not present

## 2014-05-12 DIAGNOSIS — F209 Schizophrenia, unspecified: Secondary | ICD-10-CM | POA: Diagnosis not present

## 2014-05-12 DIAGNOSIS — I10 Essential (primary) hypertension: Secondary | ICD-10-CM | POA: Diagnosis not present

## 2014-05-12 DIAGNOSIS — F319 Bipolar disorder, unspecified: Secondary | ICD-10-CM | POA: Diagnosis not present

## 2014-05-13 ENCOUNTER — Telehealth: Payer: Self-pay | Admitting: Internal Medicine

## 2014-05-13 NOTE — Telephone Encounter (Signed)
Patient called this afternoon stating her sugars were high   Blood sugar: 477 She would like to know if maybe the steroid injection could have caused high readings?  Please advise    Thank you

## 2014-05-14 ENCOUNTER — Telehealth: Payer: Self-pay | Admitting: Internal Medicine

## 2014-05-14 DIAGNOSIS — B351 Tinea unguium: Secondary | ICD-10-CM | POA: Diagnosis not present

## 2014-05-14 DIAGNOSIS — Z794 Long term (current) use of insulin: Secondary | ICD-10-CM | POA: Diagnosis not present

## 2014-05-14 DIAGNOSIS — F319 Bipolar disorder, unspecified: Secondary | ICD-10-CM | POA: Diagnosis not present

## 2014-05-14 DIAGNOSIS — L603 Nail dystrophy: Secondary | ICD-10-CM | POA: Diagnosis not present

## 2014-05-14 DIAGNOSIS — E1051 Type 1 diabetes mellitus with diabetic peripheral angiopathy without gangrene: Secondary | ICD-10-CM | POA: Diagnosis not present

## 2014-05-14 DIAGNOSIS — F209 Schizophrenia, unspecified: Secondary | ICD-10-CM | POA: Diagnosis not present

## 2014-05-14 DIAGNOSIS — I739 Peripheral vascular disease, unspecified: Secondary | ICD-10-CM | POA: Diagnosis not present

## 2014-05-14 DIAGNOSIS — I10 Essential (primary) hypertension: Secondary | ICD-10-CM | POA: Diagnosis not present

## 2014-05-14 DIAGNOSIS — E119 Type 2 diabetes mellitus without complications: Secondary | ICD-10-CM | POA: Diagnosis not present

## 2014-05-14 NOTE — Telephone Encounter (Signed)
Case manager from [employer] called wanting last [facility] for this pt faxed to her at [phone]  Also can you call Lexine Baton back at [phone] when it has been faxed

## 2014-05-14 NOTE — Telephone Encounter (Signed)
Faxed pt's last A1c to #[phone]. Tried to call [place] at [phone], number disconnected.

## 2014-05-14 NOTE — Telephone Encounter (Signed)
Please read message below and advise.  

## 2014-05-14 NOTE — Telephone Encounter (Signed)
Called pt and lvm advising her per Dr Arman Filter message. Advised her to call back with any questions and/or if her blood sugar does not get better.

## 2014-05-14 NOTE — Telephone Encounter (Signed)
Yes! She needs higher doses of NovoLog until the steroid effect washes off: Increase NovoLog: 14 >> 18 units with a small meal 16 >> 20 units with a regular meal 18 >> 22 units with a large meal

## 2014-05-14 NOTE — Telephone Encounter (Signed)
Please advise if you agree.

## 2014-05-18 DIAGNOSIS — F319 Bipolar disorder, unspecified: Secondary | ICD-10-CM | POA: Diagnosis not present

## 2014-05-18 DIAGNOSIS — I10 Essential (primary) hypertension: Secondary | ICD-10-CM | POA: Diagnosis not present

## 2014-05-18 DIAGNOSIS — Z794 Long term (current) use of insulin: Secondary | ICD-10-CM | POA: Diagnosis not present

## 2014-05-18 DIAGNOSIS — F209 Schizophrenia, unspecified: Secondary | ICD-10-CM | POA: Diagnosis not present

## 2014-05-18 DIAGNOSIS — E119 Type 2 diabetes mellitus without complications: Secondary | ICD-10-CM | POA: Diagnosis not present

## 2014-05-19 DIAGNOSIS — M5440 Lumbago with sciatica, unspecified side: Secondary | ICD-10-CM | POA: Diagnosis not present

## 2014-05-19 NOTE — Telephone Encounter (Signed)
pls verify with Dr Olevia Perches office that he will se pt for her expressed reason  Which is 2nd opinion re IBs Pts are always entitled to 2nd opinion so Refer for same please. I will sign.(verify before entering, at one time both local offices tried to avoid pts going back and forth, have not seen this consistently beoign done so you will need to check) Thank you.

## 2014-05-20 DIAGNOSIS — Z794 Long term (current) use of insulin: Secondary | ICD-10-CM | POA: Diagnosis not present

## 2014-05-20 DIAGNOSIS — F209 Schizophrenia, unspecified: Secondary | ICD-10-CM | POA: Diagnosis not present

## 2014-05-20 DIAGNOSIS — I10 Essential (primary) hypertension: Secondary | ICD-10-CM | POA: Diagnosis not present

## 2014-05-20 DIAGNOSIS — E119 Type 2 diabetes mellitus without complications: Secondary | ICD-10-CM | POA: Diagnosis not present

## 2014-05-20 DIAGNOSIS — F319 Bipolar disorder, unspecified: Secondary | ICD-10-CM | POA: Diagnosis not present

## 2014-05-21 DIAGNOSIS — F209 Schizophrenia, unspecified: Secondary | ICD-10-CM | POA: Diagnosis not present

## 2014-05-21 DIAGNOSIS — G894 Chronic pain syndrome: Secondary | ICD-10-CM | POA: Diagnosis not present

## 2014-05-21 DIAGNOSIS — F319 Bipolar disorder, unspecified: Secondary | ICD-10-CM | POA: Diagnosis not present

## 2014-05-21 DIAGNOSIS — M5441 Lumbago with sciatica, right side: Secondary | ICD-10-CM | POA: Diagnosis not present

## 2014-05-21 DIAGNOSIS — I10 Essential (primary) hypertension: Secondary | ICD-10-CM | POA: Diagnosis not present

## 2014-05-21 DIAGNOSIS — M961 Postlaminectomy syndrome, not elsewhere classified: Secondary | ICD-10-CM | POA: Diagnosis not present

## 2014-05-21 DIAGNOSIS — Z79891 Long term (current) use of opiate analgesic: Secondary | ICD-10-CM | POA: Diagnosis not present

## 2014-05-21 DIAGNOSIS — E119 Type 2 diabetes mellitus without complications: Secondary | ICD-10-CM | POA: Diagnosis not present

## 2014-05-24 IMAGING — CT CT CHEST W/O CM
2 of 4 series · 14 of 36 positions shown, 17 images · non-contrast
Comparison: Abdomen and pelvis CT, 07/16/2012.  Chest CT,
03/29/2010

CLINICAL DATA: The patient had an ill-defined nodular opacity at
the anterior base of the lingular segment of the left upper lobe on
the previous abdomen pelvis CT.  This is for reevaluation.

CT CHEST WITHOUT CONTRAST
TECHNIQUE: Multidetector CT imaging of the chest was performed
following the standard protocol without IV contrast.

[Series 2: chestroutine 5.0 b40f · axial · 0.61mm/px · z∈[-215,-35]mm · 11 of 40 slices shown, 14 images]
[im 2/40  mediastinal]
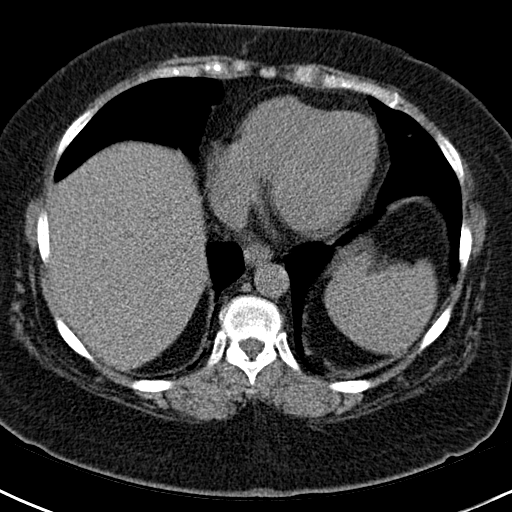
[im 2/40  lung]
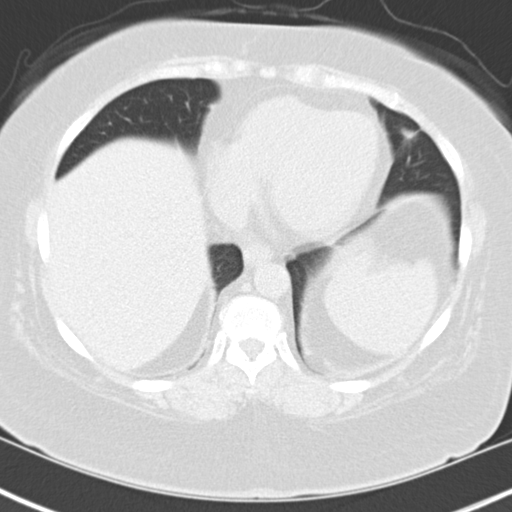
[im 6/40  lung]
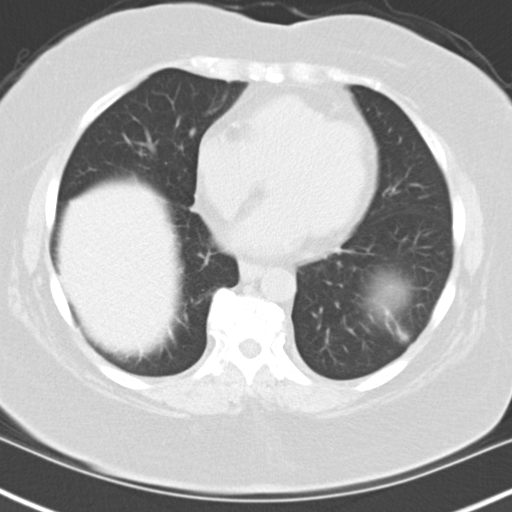
[im 10/40  lung]
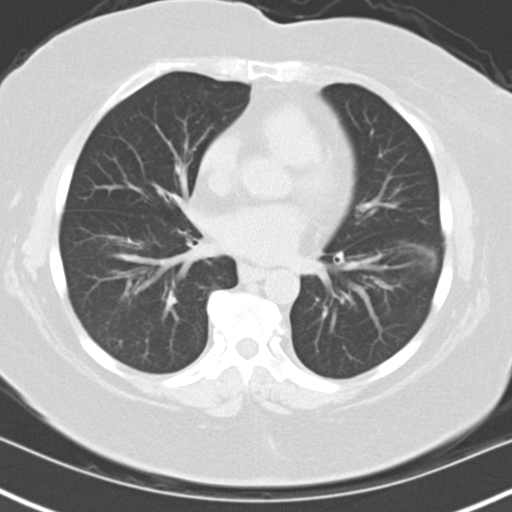
[im 14/40  lung]
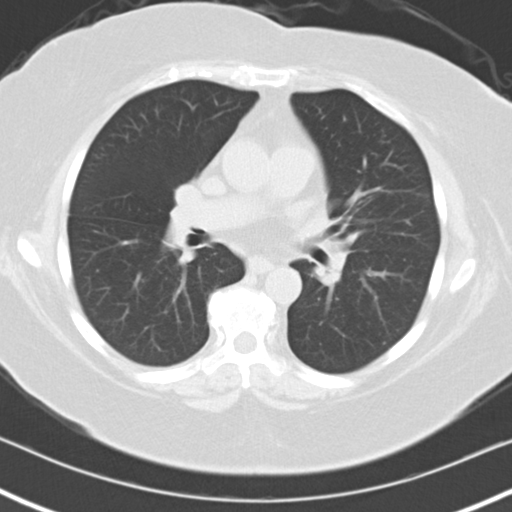
[im 17/40  mediastinal]
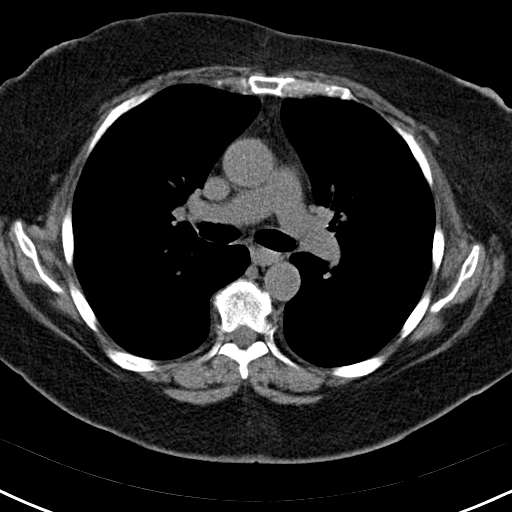
[im 17/40  lung]
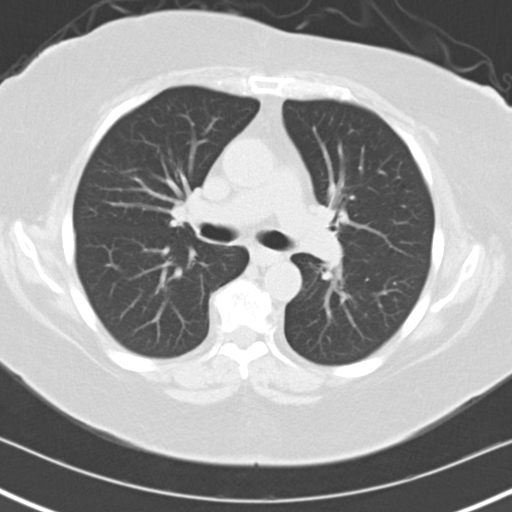
[im 21/40  lung]
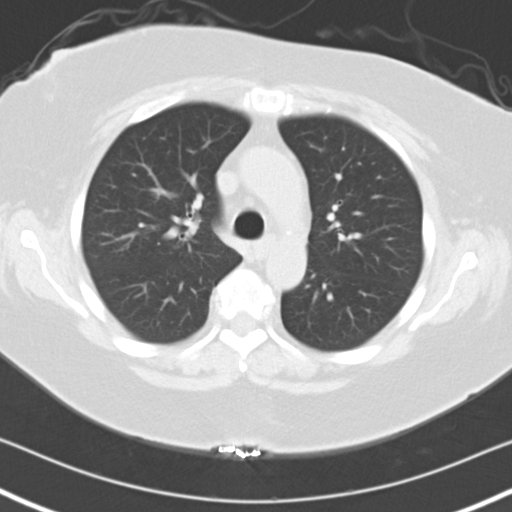
[im 23/40  lung]
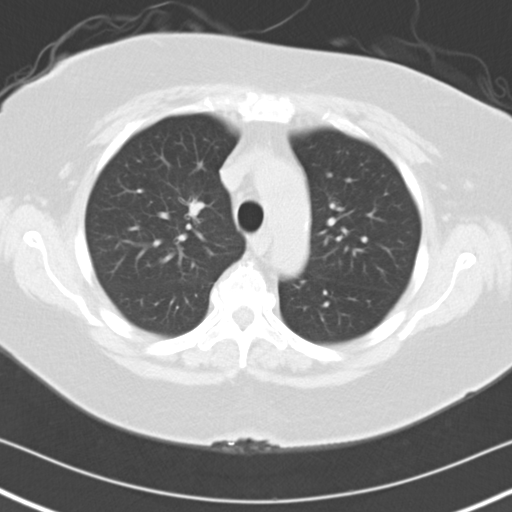
[im 27/40  lung]
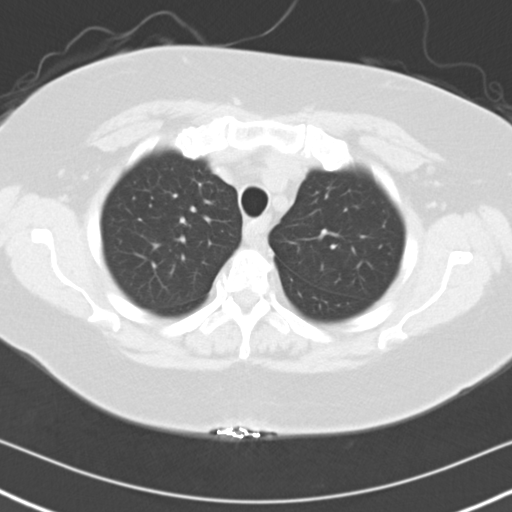
[im 30/40  mediastinal]
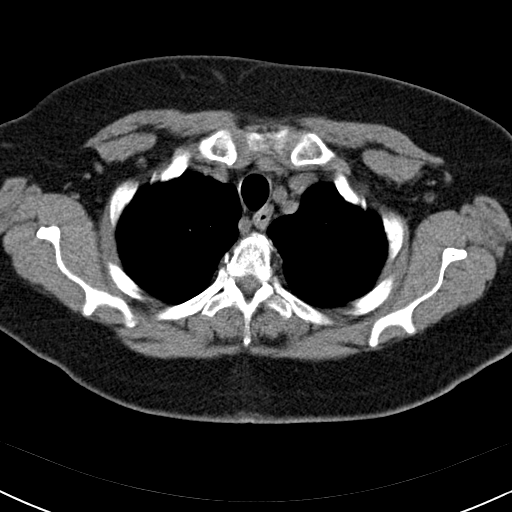
[im 30/40  lung]
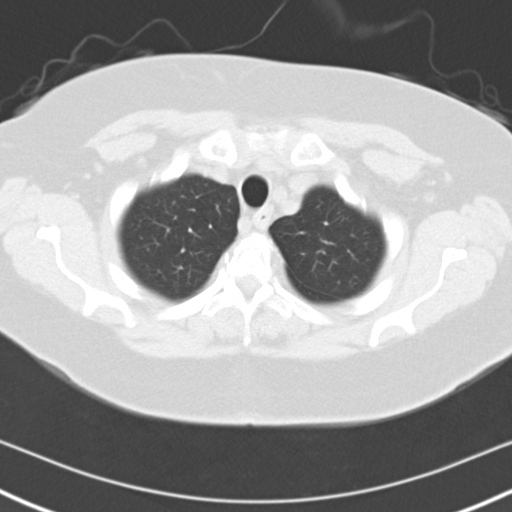
[im 34/40  lung]
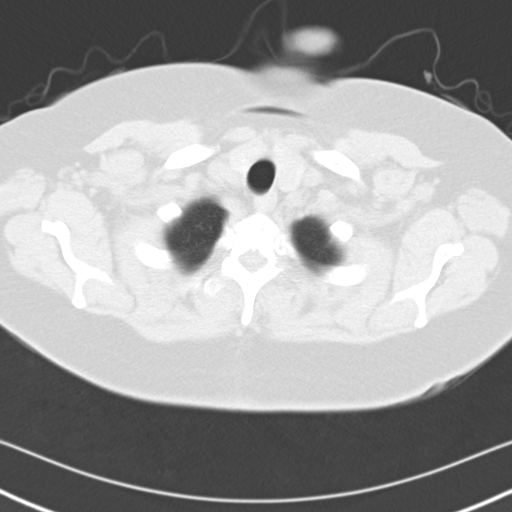
[im 38/40  lung]
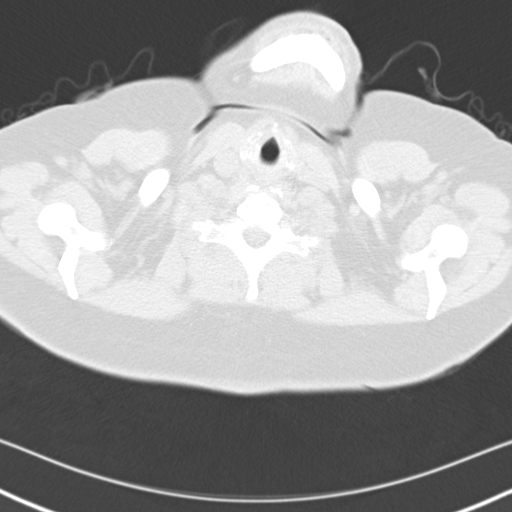

[Series 4: mpr coro 3mm · coronal · 0.39mm/px · 3 of 71 slices shown]
[im 15/71  lung]
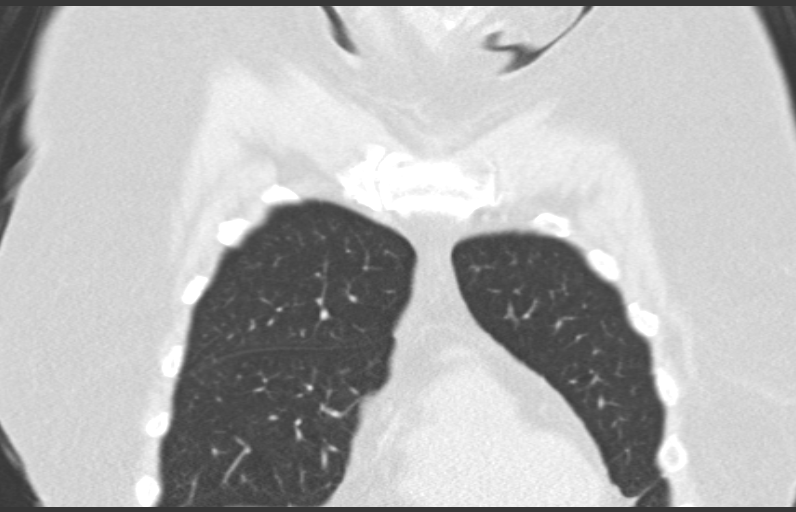
[im 29/71  lung]
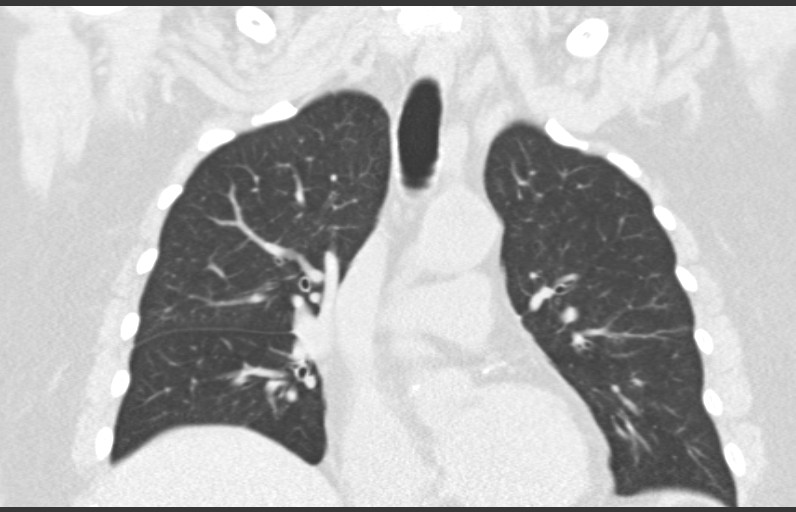
[im 43/71  lung]
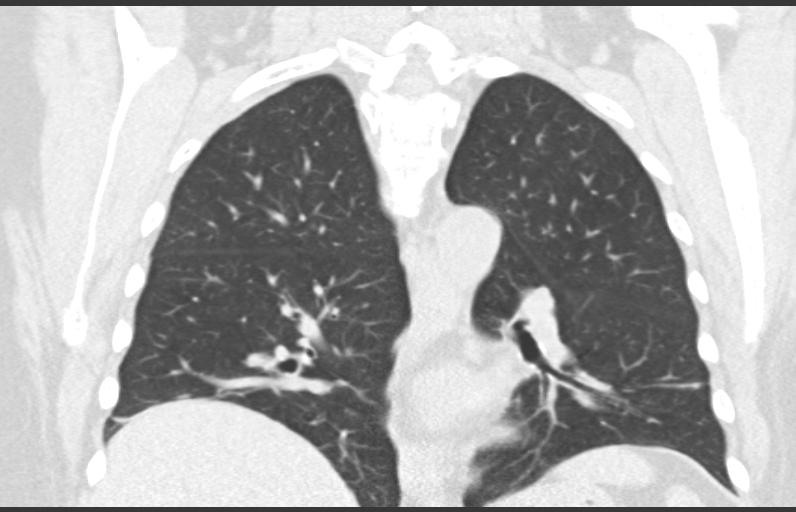

[14 of 36 positions shown; findings below may reference images not displayed]

FINDINGS: There is irregular opacity with associated reticular
opacity at the anterior base of the inferior lingular segment of
the left upper lobe, which is stable.  Its overall appearance is
that of atelectasis/scarring.  Mild reticular opacity in the lower
lobes most consistent with atelectasis, is stable.

The lungs otherwise clear.  There is no pleural effusion.  No
pneumothorax is seen.

There is an ill-defined low density nodule enlarging the left lobe
of the thyroid, which is unchanged from prior chest CT.  No
mediastinal or hilar masses or pathologically enlarged lymph nodes
are noted.  The heart is normal in size and configuration.  There
are minor coronary artery calcifications.  The great vessels are
normal in caliber.

Limited evaluation of the upper abdomen is unremarkable.

There are degenerative changes of the visualized spine.  No
osteoblastic or osteolytic lesions.
IMPRESSION: The opacity in the inferior segment of the lingula of the left
upper lobe is without significant change.  It is morphology is
consistent with scarring and / or atelectasis.  This does not have
the configuration of a nodule.  Other minor areas of reticular
subsegmental atelectasis are stable as well.  There is no discrete
lung nodule.  The lungs are otherwise clear.

No additional short-term follow-up is felt indicated.  There are no
findings suspicious for neoplastic disease.

## 2014-05-24 IMAGING — US US THYROID BIOPSY
1 series · 13 of 24 positions shown · non-contrast
Comparison: none

CLINICAL DATA: Isthmic and left lobe thyroid nodules; patient off
Plavix for 5 days for procedure

ULTRASOUND GUIDED FINE NEEDLE ASPIRATION OF THYROID NODULE:
ULTRASOUND-GUIDED FINE NEEDLE ASPIRATION OF ADDITIONAL THYROID
NODULE
TECHNIQUE: Written informed consent for procedure obtained after discussion of
procedure and risks.
Time-out protocol performed.
Isthmic and lower pole left lobe thyroid nodules localized by
ultrasound.
Isthmic nodule measures 1.9 x 0.9 x 1.8 cm.
Inferior pole left lobe nodule is more difficult to measure due to
inferior position, measuring at least 2.0 x 1.6 cm in axial
dimensions, length suboptimally visualized due to the extent of
inferior margin retro clavicular.
These two nodules abut each other, felt to represent two discrete
nodules rather than a single bilobed nodule.

[Series 2: us thyroid biopsy · 0.04mm/px · 24 acquisitions, 13 frames shown]
[im 1/24]
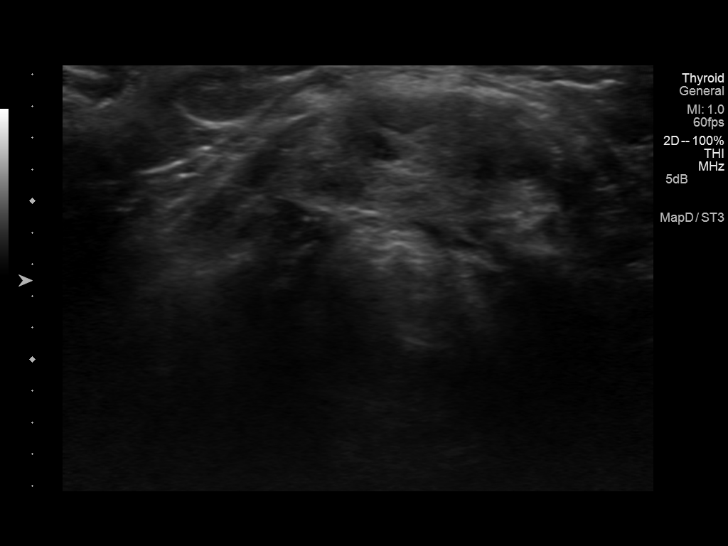
[im 3/24]
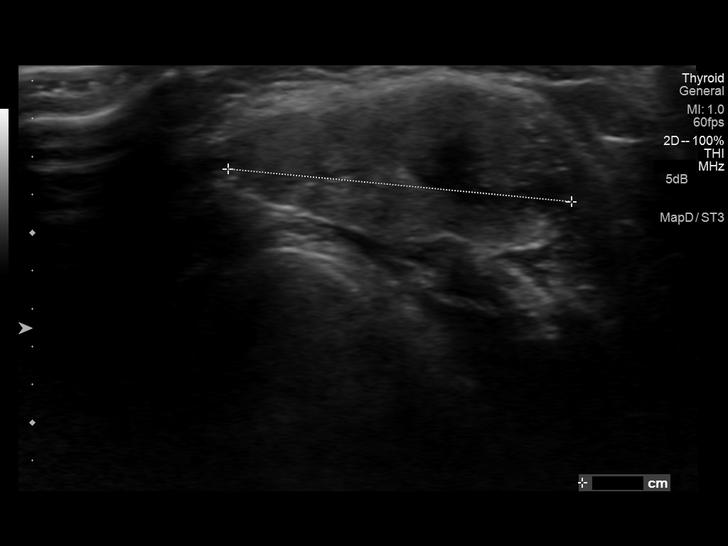
[im 5/24]
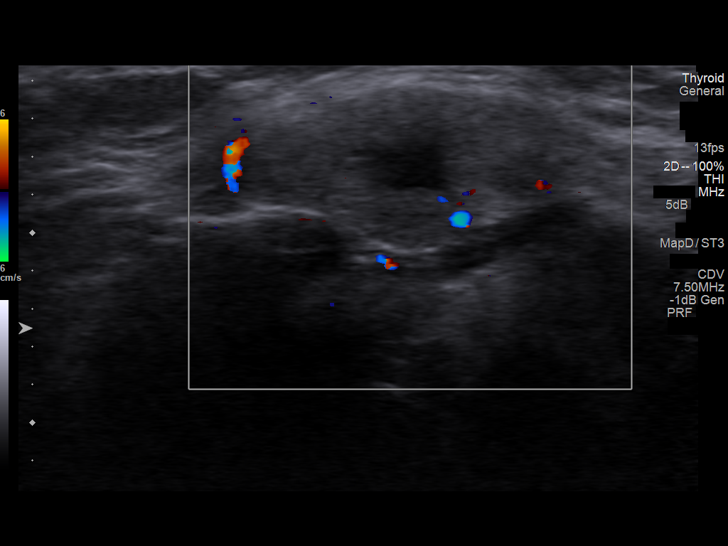
[im 7/24]
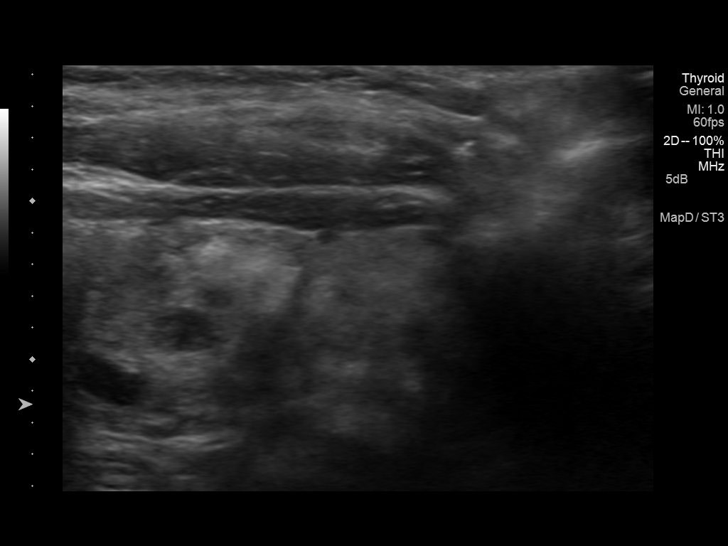
[im 9/24]
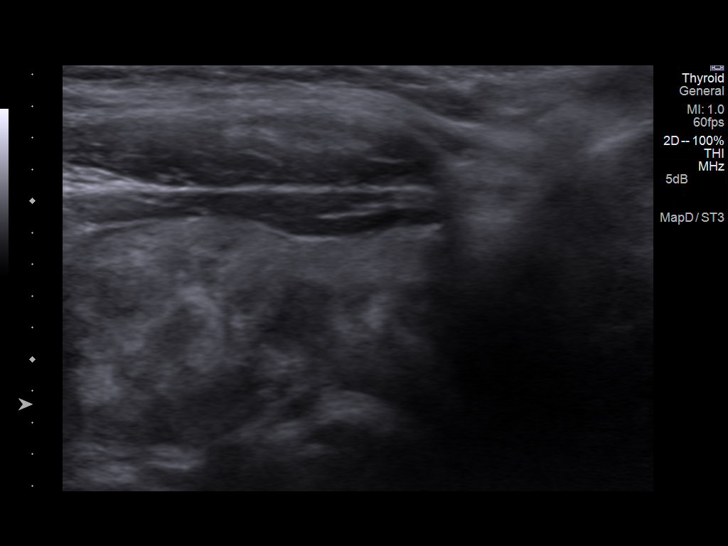
[im 11/24]
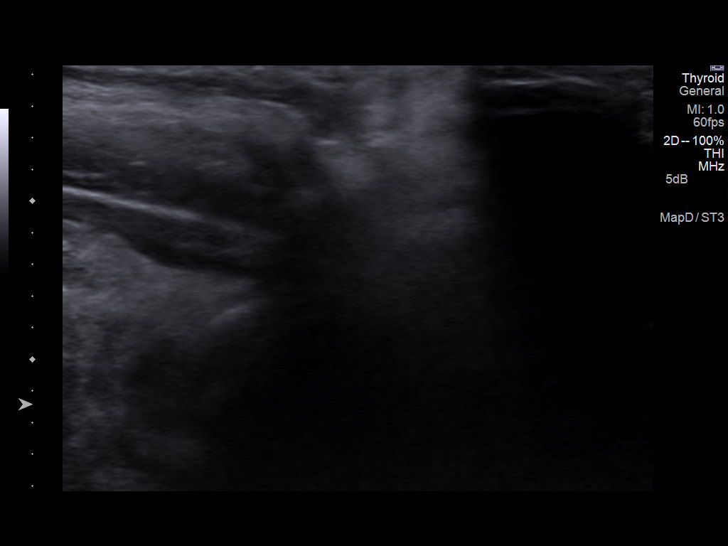
[im 13/24]
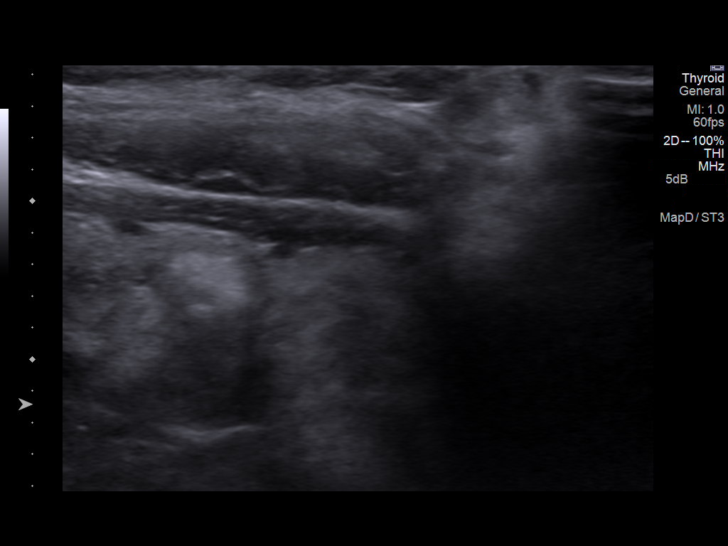
[im 14/24]
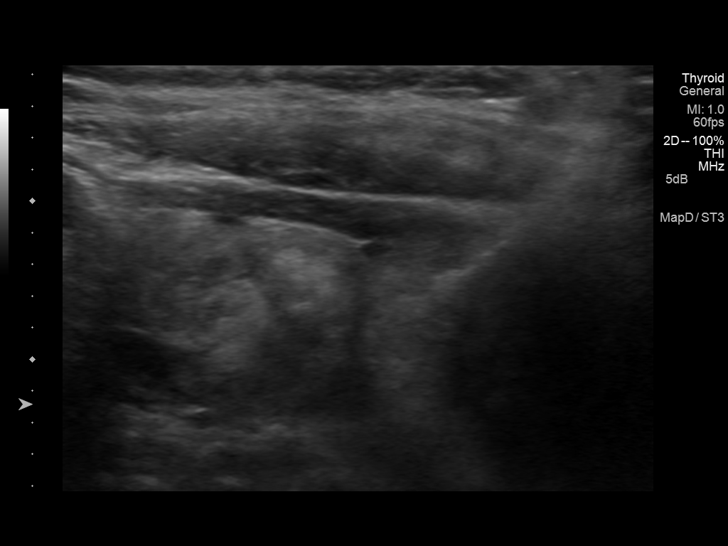
[im 16/24]
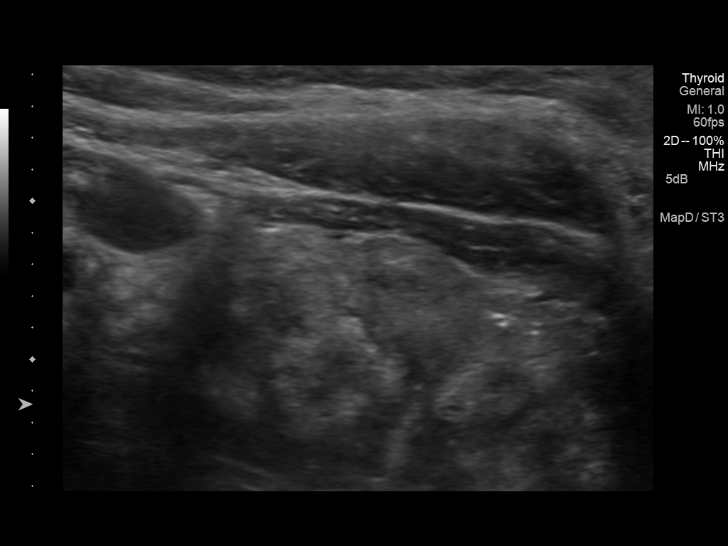
[im 18/24]
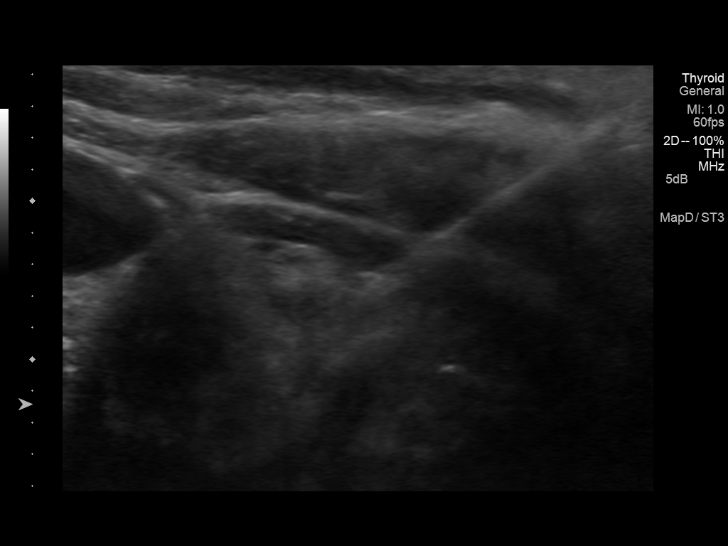
[im 20/24]
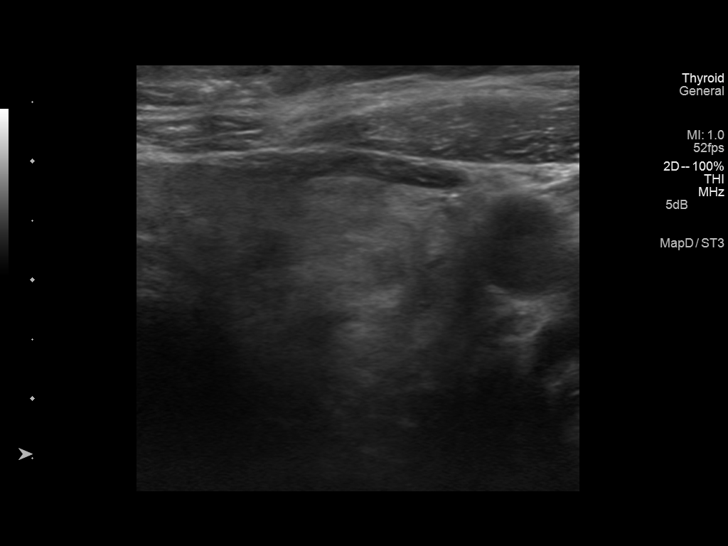
[im 22/24]
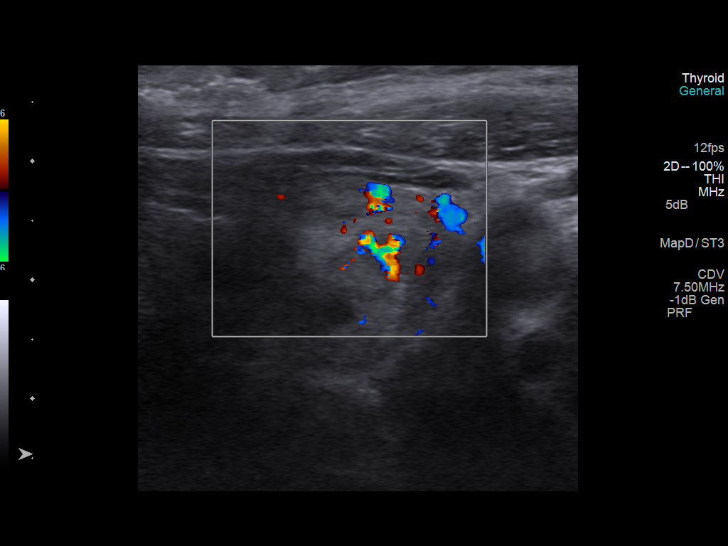
[im 24/24]
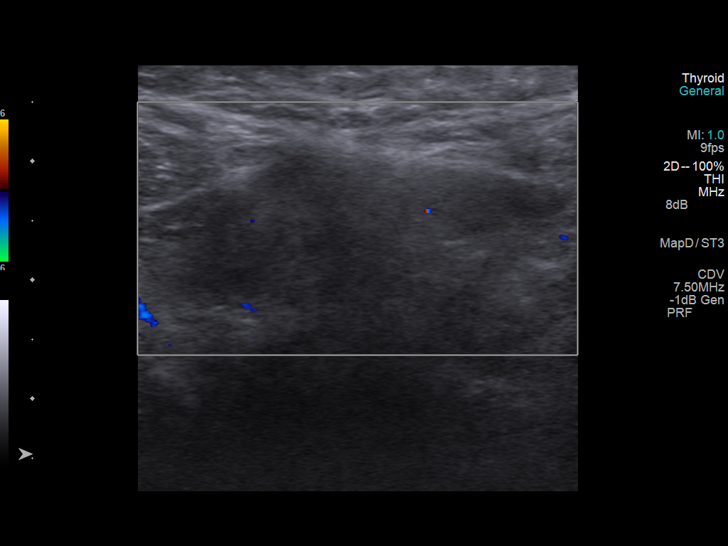

[13 of 24 positions shown; findings below may reference images not displayed]

Skin prepped and draped in usual sterile fashion.
Skin and soft tissues anesthetized with 5 ml of 2% lidocaine.
Under sonographic guidance, for 25-gauge fine needle aspirations of
the isthmic thyroid nodule were performed, first sample
hypocellular.
Procedure tolerated well by patient without immediate complication.
Under sonographic guidance, three 25-gauge fine needle aspirations
of the inferior pole left thyroid nodule were performed.
No evidence of hematoma on postprocedural images.
Standard post procedure instructions were given to patient.
Specimens sent to laboratory for analysis.
Patient instructed to restart Plavix on 11/06/2012.
IMPRESSION: Ultrasound guided fine needle aspirations of isthmic and inferior
pole left thyroid nodules.

## 2014-05-25 ENCOUNTER — Other Ambulatory Visit: Payer: Self-pay | Admitting: Family Medicine

## 2014-05-25 ENCOUNTER — Other Ambulatory Visit: Payer: Self-pay | Admitting: Gastroenterology

## 2014-05-25 ENCOUNTER — Ambulatory Visit: Payer: Commercial Managed Care - HMO | Admitting: Neurology

## 2014-05-25 DIAGNOSIS — Z794 Long term (current) use of insulin: Secondary | ICD-10-CM | POA: Diagnosis not present

## 2014-05-25 DIAGNOSIS — F209 Schizophrenia, unspecified: Secondary | ICD-10-CM | POA: Diagnosis not present

## 2014-05-25 DIAGNOSIS — F319 Bipolar disorder, unspecified: Secondary | ICD-10-CM | POA: Diagnosis not present

## 2014-05-25 DIAGNOSIS — I10 Essential (primary) hypertension: Secondary | ICD-10-CM | POA: Diagnosis not present

## 2014-05-25 DIAGNOSIS — E119 Type 2 diabetes mellitus without complications: Secondary | ICD-10-CM | POA: Diagnosis not present

## 2014-05-26 ENCOUNTER — Telehealth: Payer: Self-pay | Admitting: *Deleted

## 2014-05-26 ENCOUNTER — Other Ambulatory Visit (HOSPITAL_COMMUNITY): Payer: Self-pay | Admitting: Neurosurgery

## 2014-05-26 DIAGNOSIS — D329 Benign neoplasm of meninges, unspecified: Secondary | ICD-10-CM

## 2014-05-26 NOTE — Telephone Encounter (Signed)
Pt called stating her blood sugar has been high 300-400's last week. Please advise. Here is a list of blood sugars since last Friday.   3/11   3/12   3/13   3/14   3/15 266 AM  190 AM  174 AM  147 AM  174  AM 183 B/lunch 178 B/lunch 113 B/lunch 194 B/lunch 371 B/lunch 258 B/dinner 190 B/dinner 125 B/dinner 180 B/dinner 201 Bedtime 188 Bedtime 189 Bedtime 100  Bedtime

## 2014-05-26 NOTE — Telephone Encounter (Signed)
Let's increase Toujeo from 40 units to 50 units at bedtime. Continue the current NovoLog doses:  18 units before a small meal  20 units before a regular meal  22 units before a large meal Please call us back with the sugars on Monday, or sooner if they stay very high or very low.

## 2014-05-27 NOTE — Telephone Encounter (Signed)
Called pt and advised her per Dr Arman Filter message. Pt understood. Will call on Monday with sugar readings.

## 2014-05-28 ENCOUNTER — Ambulatory Visit (INDEPENDENT_AMBULATORY_CARE_PROVIDER_SITE_OTHER): Payer: Commercial Managed Care - HMO | Admitting: Otolaryngology

## 2014-05-28 DIAGNOSIS — R430 Anosmia: Secondary | ICD-10-CM | POA: Diagnosis not present

## 2014-05-29 ENCOUNTER — Ambulatory Visit: Payer: Commercial Managed Care - HMO | Admitting: Neurology

## 2014-06-01 ENCOUNTER — Encounter (HOSPITAL_COMMUNITY): Payer: Self-pay | Admitting: *Deleted

## 2014-06-01 ENCOUNTER — Inpatient Hospital Stay (HOSPITAL_COMMUNITY)
Admission: EM | Admit: 2014-06-01 | Discharge: 2014-06-07 | DRG: 392 | Disposition: A | Discharge status: Home Health | Payer: Commercial Managed Care - HMO | Attending: Internal Medicine | Admitting: Internal Medicine

## 2014-06-01 DIAGNOSIS — Z823 Family history of stroke: Secondary | ICD-10-CM

## 2014-06-01 DIAGNOSIS — F319 Bipolar disorder, unspecified: Secondary | ICD-10-CM | POA: Diagnosis present

## 2014-06-01 DIAGNOSIS — D649 Anemia, unspecified: Secondary | ICD-10-CM | POA: Diagnosis not present

## 2014-06-01 DIAGNOSIS — Z90710 Acquired absence of both cervix and uterus: Secondary | ICD-10-CM | POA: Diagnosis not present

## 2014-06-01 DIAGNOSIS — K3184 Gastroparesis: Secondary | ICD-10-CM | POA: Diagnosis present

## 2014-06-01 DIAGNOSIS — Z800 Family history of malignant neoplasm of digestive organs: Secondary | ICD-10-CM | POA: Diagnosis not present

## 2014-06-01 DIAGNOSIS — Z833 Family history of diabetes mellitus: Secondary | ICD-10-CM | POA: Diagnosis not present

## 2014-06-01 DIAGNOSIS — I69354 Hemiplegia and hemiparesis following cerebral infarction affecting left non-dominant side: Secondary | ICD-10-CM

## 2014-06-01 DIAGNOSIS — K6389 Other specified diseases of intestine: Secondary | ICD-10-CM | POA: Diagnosis not present

## 2014-06-01 DIAGNOSIS — K5900 Constipation, unspecified: Secondary | ICD-10-CM | POA: Diagnosis not present

## 2014-06-01 DIAGNOSIS — D509 Iron deficiency anemia, unspecified: Secondary | ICD-10-CM | POA: Diagnosis present

## 2014-06-01 DIAGNOSIS — E1065 Type 1 diabetes mellitus with hyperglycemia: Secondary | ICD-10-CM | POA: Diagnosis not present

## 2014-06-01 DIAGNOSIS — E039 Hypothyroidism, unspecified: Secondary | ICD-10-CM | POA: Diagnosis not present

## 2014-06-01 DIAGNOSIS — Z9889 Other specified postprocedural states: Secondary | ICD-10-CM | POA: Diagnosis not present

## 2014-06-01 DIAGNOSIS — M810 Age-related osteoporosis without current pathological fracture: Secondary | ICD-10-CM | POA: Diagnosis present

## 2014-06-01 DIAGNOSIS — R100 Acute abdomen: Secondary | ICD-10-CM | POA: Diagnosis not present

## 2014-06-01 DIAGNOSIS — G40909 Epilepsy, unspecified, not intractable, without status epilepticus: Secondary | ICD-10-CM | POA: Diagnosis present

## 2014-06-01 DIAGNOSIS — E1165 Type 2 diabetes mellitus with hyperglycemia: Secondary | ICD-10-CM | POA: Diagnosis not present

## 2014-06-01 DIAGNOSIS — G8929 Other chronic pain: Secondary | ICD-10-CM | POA: Diagnosis present

## 2014-06-01 DIAGNOSIS — Z8249 Family history of ischemic heart disease and other diseases of the circulatory system: Secondary | ICD-10-CM | POA: Diagnosis not present

## 2014-06-01 DIAGNOSIS — F17210 Nicotine dependence, cigarettes, uncomplicated: Secondary | ICD-10-CM | POA: Diagnosis present

## 2014-06-01 DIAGNOSIS — R1084 Generalized abdominal pain: Secondary | ICD-10-CM | POA: Diagnosis not present

## 2014-06-01 DIAGNOSIS — I10 Essential (primary) hypertension: Secondary | ICD-10-CM | POA: Diagnosis present

## 2014-06-01 DIAGNOSIS — R569 Unspecified convulsions: Secondary | ICD-10-CM

## 2014-06-01 DIAGNOSIS — M1990 Unspecified osteoarthritis, unspecified site: Secondary | ICD-10-CM | POA: Diagnosis present

## 2014-06-01 DIAGNOSIS — Z9049 Acquired absence of other specified parts of digestive tract: Secondary | ICD-10-CM | POA: Diagnosis present

## 2014-06-01 DIAGNOSIS — D6489 Other specified anemias: Secondary | ICD-10-CM | POA: Diagnosis not present

## 2014-06-01 DIAGNOSIS — E785 Hyperlipidemia, unspecified: Secondary | ICD-10-CM | POA: Diagnosis present

## 2014-06-01 DIAGNOSIS — Z794 Long term (current) use of insulin: Secondary | ICD-10-CM

## 2014-06-01 DIAGNOSIS — J449 Chronic obstructive pulmonary disease, unspecified: Secondary | ICD-10-CM | POA: Diagnosis present

## 2014-06-01 DIAGNOSIS — R112 Nausea with vomiting, unspecified: Secondary | ICD-10-CM | POA: Diagnosis not present

## 2014-06-01 DIAGNOSIS — G4733 Obstructive sleep apnea (adult) (pediatric): Secondary | ICD-10-CM | POA: Diagnosis not present

## 2014-06-01 DIAGNOSIS — Z7982 Long term (current) use of aspirin: Secondary | ICD-10-CM | POA: Diagnosis not present

## 2014-06-01 DIAGNOSIS — M150 Primary generalized (osteo)arthritis: Secondary | ICD-10-CM | POA: Diagnosis not present

## 2014-06-01 DIAGNOSIS — I639 Cerebral infarction, unspecified: Secondary | ICD-10-CM | POA: Diagnosis not present

## 2014-06-01 DIAGNOSIS — E119 Type 2 diabetes mellitus without complications: Secondary | ICD-10-CM | POA: Diagnosis not present

## 2014-06-01 DIAGNOSIS — K529 Noninfective gastroenteritis and colitis, unspecified: Principal | ICD-10-CM | POA: Diagnosis present

## 2014-06-01 DIAGNOSIS — K219 Gastro-esophageal reflux disease without esophagitis: Secondary | ICD-10-CM | POA: Diagnosis present

## 2014-06-01 DIAGNOSIS — F39 Unspecified mood [affective] disorder: Secondary | ICD-10-CM | POA: Diagnosis present

## 2014-06-01 DIAGNOSIS — M79609 Pain in unspecified limb: Secondary | ICD-10-CM | POA: Diagnosis not present

## 2014-06-01 NOTE — ED Provider Notes (Signed)
CSN: [id number]     Arrival date & time 06/01/14  2155 History   First MD Initiated Contact with Patient 06/01/14 2343    This chart was scribed for Rolland Porter, MD by Terressa Koyanagi, ED Scribe. This patient was seen in room APOTF/OTF and the patient's care was started at 11:58 PM.  Chief Complaint  Patient presents with  . Abdominal Pain   The history is provided by the patient and a relative. No language interpreter was used.   PCP: Tula Nakayama, MD HPI Comments: [name] is a 64 y.o. female who lives alone and uses a cane to ambulate at baseline, with PMH noted below, who presents to the Emergency Department complaining of acute, sudden onset, shooting, cramping, pressure constant abd pain with associated bloating, nausea/vomiting (green bile), decreased appetite, diaphoresis, and chills onset at 10:30AM today. Pt reports taking Gas-X around 1:30PM with minimal, temporary relief from the pain. However, pt notes that her "stomach kept swelling." Pt also complains of aching left breast pain. Pt notes she had two BMs today that were normal and denies diarrhea, dysuria, or frequency. Pt denies any Hx of similar Sx. Per pt's daughter, however, pt has been experiencing abd bloating for the past couple of weeks and is already talked to her doctor about it and been seen by the gastroenterologist. Patient states her stomach is so painful that it is painful to have clothes around her waistband. Nothing she has done today made the pain feel better She also describes loss of appetite. She describes prior hysterectomy, cholecystectomy, ovarian cyst surgery, and then oophorectomy.[name]   PCP Dr Bari Mantis GI Dr Gala Romney Pulmonary Dr Annamaria Boots  Past Medical History  Diagnosis Date  . Bipolar disorder   . CVA (cerebral infarction)   . Pancreatitis 2006    due to Depakote with normal EUS   . Osteoporosis   . Chronic back pain   . Diabetes mellitus   . Trigger finger   . Anxiety disorder   . Hypertension   .  Migraines     chronic headaches  . Diverticulosis     TCS 9/08 by Dr. Delfin Edis for diarrhea . Bx for micro scopic colitis negative.   . Schatzki's ring     non critical / EGD with ED 8/2011with RMR  . S/P colonoscopy [id number], 2011    left-sided diverticula, hx of simple adenomas . 2011, random bx negative for microscopic colitis  . Glaucoma   . Allergic rhinitis   . Hypothyroidism     thyroid goiter  . Anemia   . Blood transfusion   . GERD (gastroesophageal reflux disease)   . Stroke     left sided weakness  . Seizures     unknown etiology-on meds-last seizure was 3 yerars ago  . Anxiety   . Depression   . Metabolic encephalopathy 0/92/3300  . Sleep apnea     on CPAP  . Arthritis   . Gum symptoms     infection on antibiotic  . Chronic neck pain   . Mononeuritis lower limb   . Frequent falls    Past Surgical History  Procedure Laterality Date  . Abdominal hysterectomy  1978  . Cholecystectomy  1984  . Ovarian cyst removal    . Carpal tunnel release Left 07/22/04    Dr. Aline Brochure  . Breast reduction surgery  1994  . Cataract extraction Bilateral   . Biopsy thyroid  2009  . Surgical excision of 3 tumors from right  thigh and right buttock  and left upper thigh  2010  . Back surgery  July 2012  . Spine surgery  09/29/2010    Dr. Rolena Infante  . Maloney dilation  12/29/2010    RMR;  . Esophagogastroduodenoscopy  12/29/2010    Rourk-Retained food in the esophagus and stomach, small hiatal hernia, status post Maloney dilation of the esophagus  . Craniotomy  11/23/2011    Procedure: CRANIOTOMY TUMOR EXCISION;  Surgeon: Hosie Spangle, MD;  Location: [place] [facility];  Service: Neurosurgery;  Laterality: N/A;  Craniotomy for tumor resection  . Brain surgery  11/2011    resection of meningioma  . Colonoscopy N/A 09/25/2012    [id number] diverticulosis.  colonic polyp-removed : tubular adenoma  . Esophagogastroduodenoscopy N/A 09/25/2012    [id number] atonic baggy esophagus  status post Maloney dilation 61 F. Hiatal hernia  . Givens capsule study N/A 01/15/2013    NORMAL.   . Bacterial overgrowth test N/A 05/05/2013    Procedure: BACTERIAL OVERGROWTH TEST;  Surgeon: Daneil Dolin, MD;  Location: [facility];  Service: Endoscopy;  Laterality: N/A;  7:30  . Transthoracic echocardiogram  2010    EF 60-65%, mild conc LVH, grade 1 diastolic dysfunction; mildly calcified MV annulus with mildly thickened leaflets, mildly calcified MR annulus  . Nm myocar perf wall motion  2006    "relavtiely normal" persantine, mild anterior thinning (breast attenuation artifact), no region of scar/ischemia  . Cardiac catheterization  05/10/2005    normal coronaries, normal LV systolic function and EF (Dr. Jackie Plum)   Family History  Problem Relation Age of Onset  . Heart attack Mother     HTN  . Pneumonia Father   . Kidney failure Father   . Pancreatic cancer Sister   . Diabetes Brother   . Hypertension Brother   . Colon cancer Neg Hx   . Anesthesia problems Neg Hx   . Hypotension Neg Hx   . Malignant hyperthermia Neg Hx   . Pseudochol deficiency Neg Hx   . Stroke Maternal Grandmother   . Heart attack Maternal Grandfather   . Cancer Sister   . Hypertension Son    History  Substance Use Topics  . Smoking status: Current Every Day Smoker -- 0.25 packs/day for 7 years    Types: Cigarettes  . Smokeless tobacco: Never Used     Comment: "started back but off now for 3 months" (08/18/13)  . Alcohol Use: No     Comment:     Live at home Lives alone Uses a cane ? Quit smoking, still smokes, but doesn't buy cigarettes, states she is on chantix  OB History    Gravida Para Term Preterm AB TAB SAB Ectopic Multiple Living   6 1 1  5  5         Review of Systems  Constitutional: Positive for chills, diaphoresis and appetite change.  Gastrointestinal: Positive for nausea, vomiting and abdominal pain. Negative for diarrhea.  Genitourinary: Negative for dysuria and frequency.    Musculoskeletal:       Left breast pain   Psychiatric/Behavioral: Negative for confusion.  All other systems reviewed and are negative.  Allergies  Cephalexin; Iron; Milk-related compounds; Penicillins; and Phenazopyridine hcl  Home Medications   Prior to Admission medications   Medication Sig Start Date End Date Taking? Authorizing Provider  aspirin EC 81 MG tablet Take 81 mg by mouth daily.    Historical Provider, MD  b complex vitamins tablet Take 1 tablet  by mouth daily.    Historical Provider, MD  Cholecalciferol (D 5000) 5000 UNITS capsule Take 5,000 Units by mouth daily.    Historical Provider, MD  cloNIDine (CATAPRES) 0.3 MG tablet TAKE 1 TABLET BY MOUTH EVERY EIGHT HOURS. ONCE AT 8AM, [place], AND 12 MIDNIGHT. 05/12/14   [name], MD  CRESTOR 5 MG tablet TAKE 1 TABLET BY MOUTH AT BEDTIME. 05/25/14   [name], MD  diclofenac sodium (VOLTAREN) 1 % GEL Apply 2 g topically daily as needed (Pain).    Historical Provider, MD  dicyclomine (BENTYL) 10 MG capsule TAKE 1 CAPSULE BY MOUTH FOUR TIMES DAILY BEFORE MEALS AND AT BEDTIME. 05/25/14   Orvil Feil, NP  fluticasone [facility]) 50 MCG/ACT nasal spray Place 2 sprays into both nostrils 2 (two) times daily. 04/23/14   Historical Provider, MD  glucose blood (ACCU-CHEK AVIVA PLUS) test strip Use to test blood sugar 4 times daily as instructed. Dx code: E10.65 01/22/14   Philemon Kingdom, MD  hydrochlorothiazide (HYDRODIURIL) 25 MG tablet TAKE 1 TABLET BY MOUTH ONCE DAILY. 04/30/14   [name], MD  HYDROmorphone (DILAUDID) 4 MG tablet Take 4 mg by mouth 3 (three) times daily as needed. For pain 02/20/14   Historical Provider, MD  hydroxychloroquine (PLAQUENIL) 200 MG tablet Take 200 mg by mouth daily.    Historical Provider, MD  insulin aspart (NOVOLOG FLEXPEN) 100 UNIT/ML FlexPen Inject 12-18 Units into the skin 3 (three) times daily with meals. 04/06/14   Philemon Kingdom, MD  Insulin Glargine (TOUJEO SOLOSTAR) 300  UNIT/ML SOPN Inject 40 Units into the skin at bedtime. 04/10/14   Philemon Kingdom, MD  Insulin Pen Needle (UNIFINE PENTIPS) 31G X 6 MM MISC Use to inject insulin 4 times daily as directed. 01/22/14   Philemon Kingdom, MD  JANUVIA 25 MG tablet Take 25 mg by mouth daily. 04/30/14   Historical Provider, MD  lamoTRIgine (LAMICTAL) 100 MG tablet Take 1.5 tablets (150 mg total) by mouth every morning. 02/23/14   Pieter Partridge, DO  levothyroxine (LEVOXYL) 50 MCG tablet Take 1 tablet (50 mcg total) by mouth every morning. 05/05/14   Philemon Kingdom, MD  LORazepam (ATIVAN) 0.5 MG tablet Take 0.5 mg by mouth at bedtime.     Historical Provider, MD  losartan (COZAAR) 100 MG tablet TAKE 1 TABLET BY MOUTH ONCE DAILY. 02/09/14   [name], MD  meclizine (ANTIVERT) 12.5 MG tablet Take 1 tablet (12.5 mg total) by mouth 3 (three) times daily as needed. For nausea 03/11/14   [name], MD  methocarbamol (ROBAXIN) 500 MG tablet Take 500 mg by mouth every morning.     Historical Provider, MD  metoprolol (LOPRESSOR) 50 MG tablet TAKE 1 TABLET BY MOUTH TWICE DAILY. 03/05/14   Pixie Casino, MD  mirtazapine (REMERON) 30 MG tablet Take 30 mg by mouth at bedtime.     Historical Provider, MD  mometasone-formoterol (DULERA) 100-5 MCG/ACT AERO Inhale 2 puffs into the lungs 2 (two) times daily. 10/30/13   Deneise Lever, MD  montelukast (SINGULAIR) 10 MG tablet TAKE ONE TABLET BY MOUTH ONCE DAILY. 05/25/14   [name], MD  Multiple Vitamins-Minerals (ONE-A-DAY 50 PLUS PO) Take 1 tablet by mouth daily.    Historical Provider, MD  nystatin (MYCOSTATIN) powder Apply topically 4 (four) times daily. 03/11/14   [name], MD  pregabalin (LYRICA) 75 MG capsule Take 75 mg by mouth 2 (two) times daily.  10/26/11   Dorothyann Peng  Vela Prose, MD  RABEprazole (ACIPHEX) 20 MG tablet TAKE 1 TABLET BY MOUTH TWICE DAILY. 01/12/14   Mahala Menghini, PA-C  RESTASIS 0.05 % ophthalmic emulsion Place 1 drop into both eyes 2  (two) times daily.  02/04/14   Historical Provider, MD  sertraline (ZOLOFT) 100 MG tablet Take 100 mg by mouth every morning.     Historical Provider, MD  spironolactone (ALDACTONE) 25 MG tablet TAKE ONE TABLET BY MOUTH DAILY. 05/25/14   [name], MD  thioridazine (MELLARIL) 10 MG tablet Take 20 mg by mouth at bedtime.  08/16/12   Historical Provider, MD  traZODone (DESYREL) 100 MG tablet Take 100 mg by mouth at bedtime. 04/13/14   Historical Provider, MD  varenicline (CHANTIX PAK) 0.5 MG X 11 & 1 MG X 42 tablet Take one 0.5 mg tablet by mouth once daily for 3 days, then increase to one 0.5 mg tablet twice daily for 4 days, then increase to one 1 mg tablet twice daily. 05/05/14   Deneise Lever, MD  ZETIA 10 MG tablet TAKE ONE TABLET BY MOUTH ONCE DAILY. 05/25/14   [name], MD   Triage Vitals: BP 191/66 mmHg  Pulse 67  Temp(Src) 98.1 F (36.7 C) (Oral)  Resp 20  Ht 4\' 11"  (1.499 m)  Wt 170 lb (77.111 kg)  BMI 34.32 kg/m2  SpO2 100%  Vital signs normal except for hypertension  Physical Exam  Constitutional: She is oriented to person, place, and time. She appears well-developed and well-nourished.  Non-toxic appearance. She does not appear ill. She appears distressed.  HENT:  Head: Normocephalic and atraumatic.  Right Ear: External ear normal.  Left Ear: External ear normal.  Nose: Nose normal. No mucosal edema or rhinorrhea.  Mouth/Throat: Mucous membranes are normal. No dental abscesses or uvula swelling.    Eyes: Conjunctivae and EOM are normal. Pupils are equal, round, and reactive to light.  Neck: Normal range of motion and full passive range of motion without pain. Neck supple.  Cardiovascular: Normal rate, regular rhythm and normal heart sounds.  Exam reveals no gallop and no friction rub.   No murmur heard. Pulmonary/Chest: Effort normal. No respiratory distress. She has wheezes (faint left expiratory wheezing). She has no rhonchi. She has no rales. She exhibits  no tenderness and no crepitus. Left breast exhibits tenderness.  Abdominal: Soft. Normal appearance and bowel sounds are normal. She exhibits no distension. There is generalized tenderness. There is no rebound and no guarding.  Musculoskeletal: Normal range of motion. She exhibits no edema or tenderness.  Moves all extremities well.   Neurological: She is alert and oriented to person, place, and time. She has normal strength. No cranial nerve deficit.  Skin: Skin is warm, dry and intact. No rash noted. No erythema. No pallor.  Psychiatric: Her speech is normal. Her mood appears not anxious.  Nursing note and vitals reviewed.   ED Course  Procedures (including critical care time)  Medications  0.9 %  sodium chloride infusion ( Intravenous Stopped 06/02/14 0417)  ciprofloxacin (CIPRO) IVPB 400 mg (0 mg Intravenous Stopped 06/02/14 0454)  metroNIDAZOLE (FLAGYL) IVPB 500 mg (500 mg Intravenous New Bag/Given 06/02/14 0454)  sodium chloride 0.9 % bolus 1,000 mL (0 mLs Intravenous Stopped 06/02/14 0239)  ondansetron (ZOFRAN) injection 4 mg (4 mg Intravenous Given 06/02/14 0036)  fentaNYL (SUBLIMAZE) injection 50 mcg (50 mcg Intravenous Given 06/02/14 0037)  iohexol (OMNIPAQUE) 300 MG/ML solution 50 mL (50 mLs Oral Contrast Given 06/02/14 0146)  iohexol (OMNIPAQUE) 300 MG/ML solution 100 mL (100 mLs Intravenous Contrast Given 06/02/14 0148)    DIAGNOSTIC STUDIES: Oxygen Saturation is 100% on RA, nl by my interpretation.    COORDINATION OF CARE: 12:08 AM-Discussed treatment plan with pt at bedside and pt agreed to plan.   3:30 AM patient given her test results. She is agreeable for admission. Patient was started on antibiotics for her enteritis.  03:44 Dr Ernestina Patches will see patient and do orders if admitted.   Labs Review Results for orders placed or performed during the hospital encounter of 06/01/14  Comprehensive metabolic panel  Result Value Ref Range   Sodium 139 135 - 145 mmol/L   Potassium  4.3 3.5 - 5.1 mmol/L   Chloride 104 96 - 112 mmol/L   CO2 26 19 - 32 mmol/L   Glucose, Bld 208 (H) 70 - 99 mg/dL   BUN 21 6 - 23 mg/dL   Creatinine, Ser 1.06 0.50 - 1.10 mg/dL   Calcium 9.6 8.4 - 10.5 mg/dL   Total Protein 7.9 6.0 - 8.3 g/dL   Albumin 4.2 3.5 - 5.2 g/dL   AST 24 0 - 37 U/L   ALT 19 0 - 35 U/L   Alkaline Phosphatase 111 39 - 117 U/L   Total Bilirubin 0.5 0.3 - 1.2 mg/dL   GFR calc non Af Amer 55 (L) >90 mL/min   GFR calc Af Amer 63 (L) >90 mL/min   Anion gap 9 5 - 15  CBC with Differential  Result Value Ref Range   WBC 10.1 4.0 - 10.5 K/uL   RBC 5.33 (H) 3.87 - 5.11 MIL/uL   Hemoglobin 12.0 12.0 - 15.0 g/dL   HCT 39.4 36.0 - 46.0 %   MCV 73.9 (L) 78.0 - 100.0 fL   MCH 22.5 (L) 26.0 - 34.0 pg   MCHC 30.5 30.0 - 36.0 g/dL   RDW 14.3 11.5 - 15.5 %   Platelets 277 150 - 400 K/uL   Neutrophils Relative % 88 (H) 43 - 77 %   Neutro Abs 8.8 (H) 1.7 - 7.7 K/uL   Lymphocytes Relative 8 (L) 12 - 46 %   Lymphs Abs 0.9 0.7 - 4.0 K/uL   Monocytes Relative 4 3 - 12 %   Monocytes Absolute 0.4 0.1 - 1.0 K/uL   Eosinophils Relative 0 0 - 5 %   Eosinophils Absolute 0.0 0.0 - 0.7 K/uL   Basophils Relative 0 0 - 1 %   Basophils Absolute 0.0 0.0 - 0.1 K/uL   RBC Morphology STOMATOCYTES   Lipase, blood  Result Value Ref Range   Lipase 23 11 - 59 U/L  Troponin I  Result Value Ref Range   Troponin I <0.03 <0.031 ng/mL  Urinalysis, Routine w reflex microscopic  Result Value Ref Range   Color, Urine YELLOW YELLOW   APPearance CLEAR CLEAR   Specific Gravity, Urine 1.020 1.005 - 1.030   pH 6.5 5.0 - 8.0   Glucose, UA NEGATIVE NEGATIVE mg/dL   Hgb urine dipstick NEGATIVE NEGATIVE   Bilirubin Urine NEGATIVE NEGATIVE   Ketones, ur NEGATIVE NEGATIVE mg/dL   Protein, ur NEGATIVE NEGATIVE mg/dL   Urobilinogen, UA 0.2 0.0 - 1.0 mg/dL   Nitrite NEGATIVE NEGATIVE   Leukocytes, UA NEGATIVE NEGATIVE     Laboratory interpretation all normal except hyperglycemia     Imaging  Review Ct Abdomen Pelvis W Contrast  06/02/2014   CLINICAL DATA:  Initial evaluation for acute sudden onset shooting and cramping  abdominal pain with associated nausea and vomiting.  EXAM: CT ABDOMEN AND PELVIS WITH CONTRAST  TECHNIQUE: Multidetector CT imaging of the abdomen and pelvis was performed using the standard protocol following bolus administration of intravenous contrast.  CONTRAST:  62mL OMNIPAQUE IOHEXOL 300 MG/ML SOLN, 137mL OMNIPAQUE IOHEXOL 300 MG/ML SOLN  COMPARISON:  Prior CT from 07/16/2012  FINDINGS: The visualized lung bases are clear. No pleural or pericardial effusion. Scattered coronary artery and valvular calcifications noted.  The liver demonstrates a normal contrast enhanced appearance. Gallbladder is absent. No biliary dilatation. Spleen, adrenal glands, and pancreas demonstrate a normal contrast enhanced appearance.  Kidneys are equal in size with symmetric enhancement. No nephrolithiasis, hydronephrosis, or focal enhancing renal mass.  Stomach within normal limits. No evidence for bowel obstruction. Appendix well visualized within the right lower quadrant and is of normal caliber and appearance without associated inflammatory changes to suggest acute appendicitis. There is mild hazy fat stranding about several loops of small bowel clustered in the lower mid abdomen just to the right of midline (series 2, image 57). Findings are nonspecific, but suggestive of possible acute enteritis. Small volume free fluid along the right pericolic fascia noted, which may be reactive from this process. The transverse colon traverses this region, but is favored to be within normal limits, and not the source of inflammation.  Bladder within normal limits.  Uterus and ovaries are absent.  No free intraperitoneal air. No pathologically enlarged intra-abdominal pelvic lymph nodes. Normal intravascular enhancement seen within the abdomen and pelvis. Scattered atherosclerotic plaque noted within the aorta.   No acute osseous abnormality. No worrisome lytic or blastic osseous lesions. Scattered multilevel degenerative changes noted within the visualized spine.  IMPRESSION: 1. Hazy fat stranding about several loops of small bowel in the lower mid abdomen. Finding is nonspecific, but suggestive of possible acute enteritis. 2. No other acute abnormality within the abdomen and pelvis. 3. Status post cholecystectomy.   Electronically Signed   By: Jeannine Boga M.D.   On: 06/02/2014 02:44   Dg Chest Portable 1 View  06/02/2014   CLINICAL DATA:  Acute sudden onset abdominal pain  EXAM: PORTABLE CHEST - 1 VIEW  COMPARISON:  05/05/2014  FINDINGS: A single AP portable view of the chest demonstrates no focal airspace consolidation or alveolar edema. The lungs are grossly clear. There is no large effusion or pneumothorax. Cardiac and mediastinal contours appear unremarkable.  IMPRESSION: No active disease.   Electronically Signed   By: Andreas Newport M.D.   On: 06/02/2014 00:40     EKG Interpretation   Date/Time:  Tuesday June 02 2014 00:59:24 EDT Ventricular Rate:  72 PR Interval:  172 QRS Duration: 80 QT Interval:  414 QTC Calculation: 453 R Axis:   29 Text Interpretation:  Normal sinus rhythm Normal ECG No significant change  since last tracing 06 Jun 2013 Confirmed by Carizma Dunsworth  MD-I, Kay Ricciuti ([id number]) on  06/02/2014 3:26:57 AM      MDM   Final diagnoses:  Generalized abdominal pain  Nausea and vomiting, vomiting of unspecified type    Plan admission  Rolland Porter, MD, FACEP   I personally performed the services described in this documentation, which was scribed in my presence. The recorded information has been reviewed and considered.  Rolland Porter, MD, Barbette Or, MD 06/02/14 [phone]

## 2014-06-01 NOTE — ED Notes (Signed)
Pt reporting she woke up with abdominal pain and bloating. Reporting nausea.

## 2014-06-02 ENCOUNTER — Emergency Department (HOSPITAL_COMMUNITY): Payer: Commercial Managed Care - HMO

## 2014-06-02 ENCOUNTER — Encounter (HOSPITAL_COMMUNITY): Payer: Self-pay

## 2014-06-02 DIAGNOSIS — Z90710 Acquired absence of both cervix and uterus: Secondary | ICD-10-CM | POA: Diagnosis not present

## 2014-06-02 DIAGNOSIS — K3184 Gastroparesis: Secondary | ICD-10-CM | POA: Diagnosis present

## 2014-06-02 DIAGNOSIS — D649 Anemia, unspecified: Secondary | ICD-10-CM | POA: Diagnosis not present

## 2014-06-02 DIAGNOSIS — R112 Nausea with vomiting, unspecified: Secondary | ICD-10-CM | POA: Diagnosis present

## 2014-06-02 DIAGNOSIS — R1084 Generalized abdominal pain: Secondary | ICD-10-CM

## 2014-06-02 DIAGNOSIS — J449 Chronic obstructive pulmonary disease, unspecified: Secondary | ICD-10-CM | POA: Diagnosis present

## 2014-06-02 DIAGNOSIS — E039 Hypothyroidism, unspecified: Secondary | ICD-10-CM | POA: Diagnosis present

## 2014-06-02 DIAGNOSIS — I69354 Hemiplegia and hemiparesis following cerebral infarction affecting left non-dominant side: Secondary | ICD-10-CM | POA: Diagnosis not present

## 2014-06-02 DIAGNOSIS — I10 Essential (primary) hypertension: Secondary | ICD-10-CM | POA: Diagnosis present

## 2014-06-02 DIAGNOSIS — G8929 Other chronic pain: Secondary | ICD-10-CM | POA: Diagnosis present

## 2014-06-02 DIAGNOSIS — E785 Hyperlipidemia, unspecified: Secondary | ICD-10-CM | POA: Diagnosis present

## 2014-06-02 DIAGNOSIS — Z823 Family history of stroke: Secondary | ICD-10-CM | POA: Diagnosis not present

## 2014-06-02 DIAGNOSIS — G40909 Epilepsy, unspecified, not intractable, without status epilepticus: Secondary | ICD-10-CM | POA: Diagnosis present

## 2014-06-02 DIAGNOSIS — M810 Age-related osteoporosis without current pathological fracture: Secondary | ICD-10-CM | POA: Diagnosis present

## 2014-06-02 DIAGNOSIS — E1165 Type 2 diabetes mellitus with hyperglycemia: Secondary | ICD-10-CM | POA: Diagnosis present

## 2014-06-02 DIAGNOSIS — Z800 Family history of malignant neoplasm of digestive organs: Secondary | ICD-10-CM | POA: Diagnosis not present

## 2014-06-02 DIAGNOSIS — E1065 Type 1 diabetes mellitus with hyperglycemia: Secondary | ICD-10-CM | POA: Diagnosis not present

## 2014-06-02 DIAGNOSIS — K5900 Constipation, unspecified: Secondary | ICD-10-CM | POA: Diagnosis not present

## 2014-06-02 DIAGNOSIS — Z9049 Acquired absence of other specified parts of digestive tract: Secondary | ICD-10-CM | POA: Diagnosis present

## 2014-06-02 DIAGNOSIS — D6489 Other specified anemias: Secondary | ICD-10-CM | POA: Diagnosis not present

## 2014-06-02 DIAGNOSIS — F319 Bipolar disorder, unspecified: Secondary | ICD-10-CM | POA: Diagnosis present

## 2014-06-02 DIAGNOSIS — Z7982 Long term (current) use of aspirin: Secondary | ICD-10-CM | POA: Diagnosis not present

## 2014-06-02 DIAGNOSIS — D509 Iron deficiency anemia, unspecified: Secondary | ICD-10-CM | POA: Diagnosis present

## 2014-06-02 DIAGNOSIS — Z794 Long term (current) use of insulin: Secondary | ICD-10-CM | POA: Diagnosis not present

## 2014-06-02 DIAGNOSIS — Z833 Family history of diabetes mellitus: Secondary | ICD-10-CM | POA: Diagnosis not present

## 2014-06-02 DIAGNOSIS — K529 Noninfective gastroenteritis and colitis, unspecified: Principal | ICD-10-CM

## 2014-06-02 DIAGNOSIS — F17210 Nicotine dependence, cigarettes, uncomplicated: Secondary | ICD-10-CM | POA: Diagnosis present

## 2014-06-02 DIAGNOSIS — K219 Gastro-esophageal reflux disease without esophagitis: Secondary | ICD-10-CM | POA: Diagnosis present

## 2014-06-02 DIAGNOSIS — Z8249 Family history of ischemic heart disease and other diseases of the circulatory system: Secondary | ICD-10-CM | POA: Diagnosis not present

## 2014-06-02 DIAGNOSIS — M1990 Unspecified osteoarthritis, unspecified site: Secondary | ICD-10-CM | POA: Diagnosis present

## 2014-06-02 LAB — COMPREHENSIVE METABOLIC PANEL
ALBUMIN: 3.3 g/dL — AB (ref 3.5–5.2)
ALBUMIN: 4.2 g/dL (ref 3.5–5.2)
ALT: 18 U/L (ref 0–35)
ALT: 19 U/L (ref 0–35)
AST: 24 U/L (ref 0–37)
AST: 24 U/L (ref 0–37)
Alkaline Phosphatase: 111 U/L (ref 39–117)
Alkaline Phosphatase: 87 U/L (ref 39–117)
Anion gap: 9 (ref 5–15)
Anion gap: 9 (ref 5–15)
BUN: 21 mg/dL (ref 6–23)
BUN: 17 mg/dL (ref 6–23)
CALCIUM: 9.6 mg/dL (ref 8.4–10.5)
CO2: 26 mmol/L (ref 19–32)
CO2: 23 mmol/L (ref 19–32)
CREATININE: 0.91 mg/dL (ref 0.50–1.10)
Calcium: 8.3 mg/dL — ABNORMAL LOW (ref 8.4–10.5)
Chloride: 104 mmol/L (ref 96–112)
Chloride: 107 mmol/L (ref 96–112)
Creatinine, Ser: 1.06 mg/dL (ref 0.50–1.10)
GFR calc Af Amer: 63 mL/min — ABNORMAL LOW (ref >90)
GFR calc Af Amer: 76 mL/min — ABNORMAL LOW (ref >90)
GFR calc non Af Amer: 55 mL/min — ABNORMAL LOW (ref >90)
GFR calc non Af Amer: 66 mL/min — ABNORMAL LOW (ref >90)
Glucose, Bld: 208 mg/dL — ABNORMAL HIGH (ref 70–99)
Glucose, Bld: 160 mg/dL — ABNORMAL HIGH (ref 70–99)
Potassium: 4.3 mmol/L (ref 3.5–5.1)
Potassium: 4.1 mmol/L (ref 3.5–5.1)
SODIUM: 139 mmol/L (ref 135–145)
SODIUM: 139 mmol/L (ref 135–145)
TOTAL PROTEIN: 6.7 g/dL (ref 6.0–8.3)
TOTAL PROTEIN: 7.9 g/dL (ref 6.0–8.3)
Total Bilirubin: 0.5 mg/dL (ref 0.3–1.2)
Total Bilirubin: 0.4 mg/dL (ref 0.3–1.2)

## 2014-06-02 LAB — CBC WITH DIFFERENTIAL/PLATELET
Basophils Absolute: 0 10*3/uL (ref 0.0–0.1)
Basophils Absolute: 0 10*3/uL (ref 0.0–0.1)
Basophils Relative: 0 % (ref 0–1)
Basophils Relative: 0 % (ref 0–1)
Eosinophils Absolute: 0 10*3/uL (ref 0.0–0.7)
Eosinophils Absolute: 0 10*3/uL (ref 0.0–0.7)
Eosinophils Relative: 0 % (ref 0–5)
Eosinophils Relative: 0 % (ref 0–5)
HCT: 39.4 % (ref 36.0–46.0)
HEMATOCRIT: 33.5 % — AB (ref 36.0–46.0)
Hemoglobin: 12 g/dL (ref 12.0–15.0)
Hemoglobin: 10.1 g/dL — ABNORMAL LOW (ref 12.0–15.0)
LYMPHS ABS: 0.9 10*3/uL (ref 0.7–4.0)
LYMPHS ABS: 1.8 10*3/uL (ref 0.7–4.0)
LYMPHS PCT: 19 % (ref 12–46)
Lymphocytes Relative: 8 % — ABNORMAL LOW (ref 12–46)
MCH: 22.5 pg — ABNORMAL LOW (ref 26.0–34.0)
MCH: 22.3 pg — ABNORMAL LOW (ref 26.0–34.0)
MCHC: 30.5 g/dL (ref 30.0–36.0)
MCHC: 30.1 g/dL (ref 30.0–36.0)
MCV: 73.9 fL — AB (ref 78.0–100.0)
MCV: 74 fL — AB (ref 78.0–100.0)
MONO ABS: 0.6 10*3/uL (ref 0.1–1.0)
MONOS PCT: 4 % (ref 3–12)
Monocytes Absolute: 0.4 10*3/uL (ref 0.1–1.0)
Monocytes Relative: 7 % (ref 3–12)
NEUTROS ABS: 8.8 10*3/uL — AB (ref 1.7–7.7)
Neutro Abs: 7 10*3/uL (ref 1.7–7.7)
Neutrophils Relative %: 88 % — ABNORMAL HIGH (ref 43–77)
Neutrophils Relative %: 74 % (ref 43–77)
PLATELETS: 270 10*3/uL (ref 150–400)
Platelets: 277 10*3/uL (ref 150–400)
RBC: 5.33 MIL/uL — ABNORMAL HIGH (ref 3.87–5.11)
RBC: 4.53 MIL/uL (ref 3.87–5.11)
RDW: 14.2 % (ref 11.5–15.5)
RDW: 14.3 % (ref 11.5–15.5)
WBC: 10.1 10*3/uL (ref 4.0–10.5)
WBC: 9.4 10*3/uL (ref 4.0–10.5)

## 2014-06-02 LAB — URINALYSIS, ROUTINE W REFLEX MICROSCOPIC
Bilirubin Urine: NEGATIVE
GLUCOSE, UA: NEGATIVE mg/dL
HGB URINE DIPSTICK: NEGATIVE
Ketones, ur: NEGATIVE mg/dL
LEUKOCYTES UA: NEGATIVE
Nitrite: NEGATIVE
Protein, ur: NEGATIVE mg/dL
SPECIFIC GRAVITY, URINE: 1.02 (ref 1.005–1.030)
Urobilinogen, UA: 0.2 mg/dL (ref 0.0–1.0)
pH: 6.5 (ref 5.0–8.0)

## 2014-06-02 LAB — GLUCOSE, CAPILLARY
GLUCOSE-CAPILLARY: 82 mg/dL (ref 70–99)
Glucose-Capillary: 140 mg/dL — ABNORMAL HIGH (ref 70–99)
Glucose-Capillary: 140 mg/dL — ABNORMAL HIGH (ref 70–99)
Glucose-Capillary: 118 mg/dL — ABNORMAL HIGH (ref 70–99)

## 2014-06-02 LAB — TROPONIN I

## 2014-06-02 LAB — LIPASE, BLOOD: Lipase: 23 U/L (ref 11–59)

## 2014-06-02 LAB — MRSA PCR SCREENING: MRSA BY PCR: NEGATIVE

## 2014-06-02 MED ORDER — SERTRALINE HCL 50 MG PO TABS
100.0000 mg | ORAL_TABLET | Freq: Every morning | ORAL | Status: DC
  Administered 2014-06-02 – 2014-06-07 (×6): 100 mg via ORAL
  Filled 2014-06-02 (×6): qty 2

## 2014-06-02 MED ORDER — HYDROMORPHONE HCL 4 MG PO TABS
4.0000 mg | ORAL_TABLET | Freq: Three times a day (TID) | ORAL | Status: DC | PRN
  Administered 2014-06-02 – 2014-06-06 (×8): 4 mg via ORAL
  Filled 2014-06-02 (×8): qty 1

## 2014-06-02 MED ORDER — ONDANSETRON HCL 4 MG PO TABS
4.0000 mg | ORAL_TABLET | Freq: Four times a day (QID) | ORAL | Status: DC | PRN
  Administered 2014-06-03 – 2014-06-04 (×2): 4 mg via ORAL
  Filled 2014-06-02 (×2): qty 1

## 2014-06-02 MED ORDER — INSULIN ASPART 100 UNIT/ML ~~LOC~~ SOLN
0.0000 [IU] | SUBCUTANEOUS | Status: DC
  Administered 2014-06-02 – 2014-06-05 (×6): 1 [IU] via SUBCUTANEOUS
  Administered 2014-06-06: 2 [IU] via SUBCUTANEOUS
  Administered 2014-06-06: 1 [IU] via SUBCUTANEOUS

## 2014-06-02 MED ORDER — HEPARIN SODIUM (PORCINE) 5000 UNIT/ML IJ SOLN
5000.0000 [IU] | Freq: Three times a day (TID) | INTRAMUSCULAR | Status: DC
  Administered 2014-06-02 – 2014-06-07 (×16): 5000 [IU] via SUBCUTANEOUS
  Filled 2014-06-02 (×16): qty 1

## 2014-06-02 MED ORDER — SODIUM CHLORIDE 0.9 % IV BOLUS (SEPSIS)
1000.0000 mL | Freq: Once | INTRAVENOUS | Status: AC
  Administered 2014-06-02: 1000 mL via INTRAVENOUS

## 2014-06-02 MED ORDER — PREGABALIN 75 MG PO CAPS
75.0000 mg | ORAL_CAPSULE | Freq: Two times a day (BID) | ORAL | Status: DC
  Administered 2014-06-02 – 2014-06-07 (×11): 75 mg via ORAL
  Filled 2014-06-02 (×11): qty 1

## 2014-06-02 MED ORDER — LEVOTHYROXINE SODIUM 50 MCG PO TABS
50.0000 ug | ORAL_TABLET | ORAL | Status: DC
  Administered 2014-06-02 – 2014-06-07 (×6): 50 ug via ORAL
  Filled 2014-06-02 (×6): qty 1

## 2014-06-02 MED ORDER — IOHEXOL 300 MG/ML  SOLN
100.0000 mL | Freq: Once | INTRAMUSCULAR | Status: AC | PRN
  Administered 2014-06-02: 100 mL via INTRAVENOUS

## 2014-06-02 MED ORDER — ONDANSETRON HCL 4 MG/2ML IJ SOLN
4.0000 mg | Freq: Once | INTRAMUSCULAR | Status: AC
  Administered 2014-06-02: 4 mg via INTRAVENOUS
  Filled 2014-06-02: qty 2

## 2014-06-02 MED ORDER — METRONIDAZOLE IN NACL 5-0.79 MG/ML-% IV SOLN
500.0000 mg | Freq: Three times a day (TID) | INTRAVENOUS | Status: DC
  Administered 2014-06-02 – 2014-06-04 (×8): 500 mg via INTRAVENOUS
  Filled 2014-06-02 (×8): qty 100

## 2014-06-02 MED ORDER — THIORIDAZINE HCL 10 MG PO TABS
20.0000 mg | ORAL_TABLET | Freq: Every day | ORAL | Status: DC
  Administered 2014-06-02 – 2014-06-06 (×5): 20 mg via ORAL
  Filled 2014-06-02 (×6): qty 2

## 2014-06-02 MED ORDER — ONDANSETRON HCL 4 MG/2ML IJ SOLN
4.0000 mg | Freq: Four times a day (QID) | INTRAMUSCULAR | Status: DC | PRN
  Administered 2014-06-02 – 2014-06-06 (×6): 4 mg via INTRAVENOUS
  Filled 2014-06-02 (×6): qty 2

## 2014-06-02 MED ORDER — PANTOPRAZOLE SODIUM 40 MG PO TBEC
40.0000 mg | DELAYED_RELEASE_TABLET | Freq: Every day | ORAL | Status: DC
Start: 2014-06-02 — End: 2014-06-04
  Administered 2014-06-02 – 2014-06-04 (×3): 40 mg via ORAL
  Filled 2014-06-02 (×3): qty 1

## 2014-06-02 MED ORDER — FENTANYL CITRATE 0.05 MG/ML IJ SOLN
50.0000 ug | Freq: Once | INTRAMUSCULAR | Status: AC
  Administered 2014-06-02: 50 ug via INTRAVENOUS
  Filled 2014-06-02: qty 2

## 2014-06-02 MED ORDER — IOHEXOL 300 MG/ML  SOLN
50.0000 mL | Freq: Once | INTRAMUSCULAR | Status: AC | PRN
  Administered 2014-06-02: 50 mL via ORAL

## 2014-06-02 MED ORDER — ASPIRIN EC 81 MG PO TBEC
81.0000 mg | DELAYED_RELEASE_TABLET | Freq: Every day | ORAL | Status: DC
  Administered 2014-06-02 – 2014-06-07 (×6): 81 mg via ORAL
  Filled 2014-06-02 (×6): qty 1

## 2014-06-02 MED ORDER — CLONIDINE HCL 0.1 MG PO TABS
0.1000 mg | ORAL_TABLET | Freq: Three times a day (TID) | ORAL | Status: DC
Start: 2014-06-02 — End: 2014-06-07
  Administered 2014-06-02 – 2014-06-07 (×16): 0.1 mg via ORAL
  Filled 2014-06-02 (×16): qty 1

## 2014-06-02 MED ORDER — MOMETASONE FURO-FORMOTEROL FUM 100-5 MCG/ACT IN AERO
2.0000 | INHALATION_SPRAY | Freq: Two times a day (BID) | RESPIRATORY_TRACT | Status: DC
  Administered 2014-06-02 – 2014-06-07 (×10): 2 via RESPIRATORY_TRACT
  Filled 2014-06-02: qty 8.8

## 2014-06-02 MED ORDER — SODIUM CHLORIDE 0.9 % IV SOLN
INTRAVENOUS | Status: DC
  Administered 2014-06-02: 04:00:00 via INTRAVENOUS

## 2014-06-02 MED ORDER — TRAZODONE HCL 50 MG PO TABS
100.0000 mg | ORAL_TABLET | Freq: Every day | ORAL | Status: DC
  Administered 2014-06-02 – 2014-06-06 (×5): 100 mg via ORAL
  Filled 2014-06-02 (×5): qty 2

## 2014-06-02 MED ORDER — SODIUM CHLORIDE 0.9 % IV SOLN
INTRAVENOUS | Status: DC
  Administered 2014-06-02 (×2): via INTRAVENOUS

## 2014-06-02 MED ORDER — INSULIN GLARGINE 100 UNIT/ML ~~LOC~~ SOLN
40.0000 [IU] | Freq: Every day | SUBCUTANEOUS | Status: DC
  Administered 2014-06-02 – 2014-06-03 (×2): 40 [IU] via SUBCUTANEOUS
  Filled 2014-06-02 (×3): qty 0.4

## 2014-06-02 MED ORDER — METOPROLOL TARTRATE 50 MG PO TABS
50.0000 mg | ORAL_TABLET | Freq: Two times a day (BID) | ORAL | Status: DC
  Administered 2014-06-02 – 2014-06-07 (×11): 50 mg via ORAL
  Filled 2014-06-02 (×11): qty 1

## 2014-06-02 MED ORDER — CIPROFLOXACIN IN D5W 400 MG/200ML IV SOLN
400.0000 mg | Freq: Two times a day (BID) | INTRAVENOUS | Status: DC
  Administered 2014-06-02 – 2014-06-05 (×7): 400 mg via INTRAVENOUS
  Filled 2014-06-02 (×7): qty 200

## 2014-06-02 MED ORDER — LAMOTRIGINE 100 MG PO TABS
150.0000 mg | ORAL_TABLET | Freq: Every morning | ORAL | Status: DC
  Administered 2014-06-02 – 2014-06-07 (×6): 150 mg via ORAL
  Filled 2014-06-02 (×3): qty 1.5
  Filled 2014-06-02 (×2): qty 1
  Filled 2014-06-02 (×2): qty 1.5

## 2014-06-02 MED ORDER — HYDROXYCHLOROQUINE SULFATE 200 MG PO TABS
200.0000 mg | ORAL_TABLET | Freq: Every day | ORAL | Status: DC
  Administered 2014-06-02 – 2014-06-07 (×6): 200 mg via ORAL
  Filled 2014-06-02 (×7): qty 1

## 2014-06-02 NOTE — Care Management Note (Addendum)
    [place] 1 of 1   06/05/2014     3:52:50 PM CARE MANAGEMENT NOTE 06/05/2014  Patient:  [name], [name]   Account Number:  [id number]  Date Initiated:  06/02/2014  Documentation initiated by:  Jolene Provost  Subjective/Objective Assessment:   Pt is from home, lives alone. Pt has limited family support. Pt says she is able to do very few things for her self. Pt has CAP aid that comes 6hrs/7 days a week and an RN that comes biweekly for check ups.     Action/Plan:   Pt has cane, walker, WC, BSC, shower chair, and hospital bed. Pt plans to dsicharge home with continuation of previous services. No CM needs identified.   Anticipated DC Date:  06/03/2014   Anticipated DC Plan:  [place]  CM consult      [facility] Choice  Resumption Of Svcs/PTA Humphrey Guerreiro   Choice offered to / List presented to:             Status of service:  Completed, signed off Medicare Important Message given?  YES (If response is "NO", the following Medicare IM given date fields will be blank) Date Medicare IM given:  06/05/2014 Medicare IM given by:  Jolene Provost Date Additional Medicare IM given:   Additional Medicare IM given by:    Discharge Disposition:  [place]  Per UR Regulation:  Reviewed for med. necessity/level of care/duration of stay  If discussed at [place] of Stay Meetings, dates discussed:    Comments:  06/05/2014 [place], RN, MSN, CM Notified by Lorenza Chick that pt is active with RN/PT services. If pt discharged over weekend, staff instructed on who to contact for resumption of services.  06/02/2014 [place], RN, MSN, CM

## 2014-06-02 NOTE — Telephone Encounter (Signed)
Patient states that she will attempt to schedule appointment.  Is currently inpatient.

## 2014-06-02 NOTE — Progress Notes (Signed)
Patient admitted to the hospital earlier this morning by Dr. Ernestina Patches.  Patient seen and examined. She appears to be lying comfortably in bed. Lungs are clear, heart is regular, no peripheral edema, abdomen appears distended, soft and diffusely tender.  She has been admitted with possible enteritis based on CT scan findings. She has been started on cipro and flagyl, will order gi pathogen panel, she has not had further vomiting since admission, but still feels nauseous. Will hold off on starting diet at this time and continue sips with meds. Will consult GI if no improvement. She has seen Dr. Gala Romney in the past.  [name]

## 2014-06-02 NOTE — H&P (Signed)
Hospitalist Admission History and Physical  Patient name: [name] Medical record number: [id number] Date of birth: February 03, 1951 Age: 64 y.o. Gender: female  Primary Care Provider: Tula Nakayama, MD  Chief Complaint: enteritis History of Present Illness:This is a 64 y.o. year old female with significant past medical history of multiple medical problems including IDDM, arthritis, bipolar disorder, COPD, diverticulosis, hx/o CVA  presenting with enteritis. Pt reports nausea, emesis, generalized/epigastric abd pain over last 1-2 days. No fevers or chills. No known sick contacts. Presented to ER because of inability to tolerate PO. Reports hx/o colonoscopy w/ polyp removal in the past.  Presented to ER afebrile, hemodynamically stable. CBC and CMET WNL. CT abd and pelvis shows Hazy fat stranding about several loops of small bowel in the lower mid abdomen. Finding is nonspecific, but suggestive of possible acute enteritis.   Assessment and Plan: [name] is a 64 y.o. year old female presenting with enteritis   Active Problems:   Enteritis   1- Enteritis -IV cipro and flagyl  -antiemetics  -PPI -IVFs  -follow -no active diarrhea present-hold on stool studies   2- IDDM -SSI  -A1C  3-HTN -BP stable  -cont home portion of home regimen  -restart renally mediated meds once hydration improves/as clinically indicated   4- Bipolar d/o  -stable  -cont home regimen   5-Seizure d/o  -stable  -cont home regimen   FEN/GI: NPO for now except meds. ADAT.  Prophylaxis: sub q heparin  Disposition: pending further evaluation  Code Status:Full Code    Patient Active Problem List   Diagnosis Date Noted  . Enteritis 06/02/2014  . Otalgia of both ears 04/27/2014  . Sinusitis, chronic 04/22/2014  . Flatulence 02/18/2014  . Microcytic anemia 02/18/2014  . Encounter for annual physical exam 12/18/2013  . Need for vaccination with 13-polyvalent pneumococcal conjugate vaccine  12/18/2013  . Right hip pain 12/17/2013  . COPD (chronic obstructive pulmonary disease) with chronic bronchitis 09/16/2013  . Encounter for Medicare annual wellness exam 08/16/2013  . Recent head trauma 08/16/2013  . Hypothyroidism 08/16/2013  . Gastroparesis 04/28/2013  . Abdominal pain 04/28/2013  . Oral thrush 04/16/2013  . Nicotine dependence 03/09/2013  . Encounter for family conference with patient present 01/19/2013  . Seizure disorder 01/19/2013  . ANEMIA 12/11/2012  . Diabetes mellitus, insulin dependent (IDDM), uncontrolled 11/28/2012  . Cervical disc disorder with radiculopathy of cervical region 10/31/2012  . Solitary pulmonary nodule 08/19/2012  . Pain, joint, shoulder region, left 08/19/2012  . 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 Allergic sinusitis 04/18/2012  . Hypercarbia 02/15/2012  . Meningioma 11/19/2011  . 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 Metabolic encephalopathy 37/85/8850  . Hemiplegia affecting non-dominant side, post-stroke 08/02/2011  . Poor vision 08/01/2011  . Carpal tunnel syndrome of right wrist 05/23/2011  . DDD (degenerative disc disease), cervical 05/23/2011  . Polypharmacy 04/28/2011  . Bipolar disorder 04/28/2011  . Altered mental status 04/27/2011  . Constipation 04/13/2011  . Falls frequently 12/12/2010  . Dysphagia 07/12/2010  . URINARY URGENCY 12/16/2009  . HEARING LOSS 10/26/2009  . Hyperlipidemia 12/11/2008  . IBS 12/11/2008  . ACROMIOCLAVICULAR SPRAIN AND STRAIN 09/24/2008  . GERD 07/29/2008  . MILK PRODUCTS ALLERGY 07/29/2008  . HEADACHE 01/14/2008  . ABNORMALITY OF GAIT 12/30/2007  . PSYCHOTIC D/O W/HALLUCINATIONS CONDS CLASS ELSW 11/03/2007  . INSOMNIA 11/03/2007  . Essential hypertension 06/27/2007  . BACK PAIN, CHRONIC 06/19/2007  . Osteoporosis 06/19/2007  . Obstructive sleep apnea 06/19/2007  .  TRIGGER FINGER 04/18/2007  . DIVERTICULOSIS, COLON 11/13/2006   Past Medical History: Past  Medical History  Diagnosis Date  . Bipolar disorder   . CVA (cerebral infarction)   . Pancreatitis 2006    due to Depakote with normal EUS   . Osteoporosis   . Chronic back pain   . Diabetes mellitus   . Trigger finger   . Anxiety disorder   . Hypertension   . Migraines     chronic headaches  . Diverticulosis     TCS 9/08 by Dr. Delfin Edis for diarrhea . Bx for micro scopic colitis negative.   . Schatzki's ring     non critical / EGD with ED 8/2011with RMR  . S/P colonoscopy [id number], 2011    left-sided diverticula, hx of simple adenomas . 2011, random bx negative for microscopic colitis  . Glaucoma   . Allergic rhinitis   . Hypothyroidism     thyroid goiter  . Anemia   . Blood transfusion   . GERD (gastroesophageal reflux disease)   . Stroke     left sided weakness  . Seizures     unknown etiology-on meds-last seizure was 3 yerars ago  . Anxiety   . Depression   . Metabolic encephalopathy 0/34/9179  . Sleep apnea     on CPAP  . Arthritis   . Gum symptoms     infection on antibiotic  . Chronic neck pain   . Mononeuritis lower limb   . Frequent falls     Past Surgical History: Past Surgical History  Procedure Laterality Date  . Abdominal hysterectomy  1978  . Cholecystectomy  1984  . Ovarian cyst removal    . Carpal tunnel release Left 07/22/04    Dr. Aline Brochure  . Breast reduction surgery  1994  . Cataract extraction Bilateral   . Biopsy thyroid  2009  . Surgical excision of 3 tumors from right thigh and right buttock  and left upper thigh  2010  . Back surgery  July 2012  . Spine surgery  09/29/2010    Dr. Rolena Infante  . Maloney dilation  12/29/2010    RMR;  . Esophagogastroduodenoscopy  12/29/2010    Rourk-Retained food in the esophagus and stomach, small hiatal hernia, status post Maloney dilation of the esophagus  . Craniotomy  11/23/2011    Procedure: CRANIOTOMY TUMOR EXCISION;  Surgeon: Hosie Spangle, MD;  Location: [place] [facility];  Service:  Neurosurgery;  Laterality: N/A;  Craniotomy for tumor resection  . Brain surgery  11/2011    resection of meningioma  . Colonoscopy N/A 09/25/2012    XTA:VWPVXYI diverticulosis.  colonic polyp-removed : tubular adenoma  . Esophagogastroduodenoscopy N/A 09/25/2012    AXK:PVVZSMOL atonic baggy esophagus status post Maloney dilation 73 F. Hiatal hernia  . Givens capsule study N/A 01/15/2013    NORMAL.   . Bacterial overgrowth test N/A 05/05/2013    Procedure: BACTERIAL OVERGROWTH TEST;  Surgeon: Daneil Dolin, MD;  Location: [facility];  Service: Endoscopy;  Laterality: N/A;  7:30  . Transthoracic echocardiogram  2010    EF 60-65%, mild conc LVH, grade 1 diastolic dysfunction; mildly calcified MV annulus with mildly thickened leaflets, mildly calcified MR annulus  . Nm myocar perf wall motion  2006    "relavtiely normal" persantine, mild anterior thinning (breast attenuation artifact), no region of scar/ischemia  . Cardiac catheterization  05/10/2005    normal coronaries, normal LV systolic function and EF (Dr. Jackie Plum)  Social History: History   Social History  . Marital Status: Divorced    Spouse Name: N/A  . Number of Children: 1  . Years of Education: 12   Occupational History  . disabled     Social History Main Topics  . Smoking status: Current Every Day Smoker -- 0.25 packs/day for 7 years    Types: Cigarettes  . Smokeless tobacco: Never Used     Comment: "started back but off now for 3 months" (08/18/13)  . Alcohol Use: No     Comment:    . Drug Use: No  . Sexual Activity: No   Other Topics Concern  . None   Social History Narrative    Family History: Family History  Problem Relation Age of Onset  . Heart attack Mother     HTN  . Pneumonia Father   . Kidney failure Father   . Pancreatic cancer Sister   . Diabetes Brother   . Hypertension Brother   . Colon cancer Neg Hx   . Anesthesia problems Neg Hx   . Hypotension Neg Hx   . Malignant hyperthermia Neg  Hx   . Pseudochol deficiency Neg Hx   . Stroke Maternal Grandmother   . Heart attack Maternal Grandfather   . Cancer Sister   . Hypertension Son     Allergies: Allergies  Allergen Reactions  . Cephalexin Hives  . Iron Nausea And Vomiting  . Milk-Related Compounds Other (See Comments)    Doesn't agree with stomach.   . Penicillins Hives  . Phenazopyridine Hcl Other (See Comments)    [place]      Current Facility-Administered Medications  Medication Dose Route Frequency Provider Last Rate Last Dose  . 0.9 %  sodium chloride infusion   Intravenous Continuous Rolland Porter, MD 100 mL/hr at 06/02/14 0333    . 0.9 %  sodium chloride infusion   Intravenous Continuous Deneise Lever, MD      . ciprofloxacin (CIPRO) IVPB 400 mg  400 mg Intravenous Q12H Rolland Porter, MD 200 mL/hr at 06/02/14 0337 400 mg at 06/02/14 0337  . cloNIDine (CATAPRES) tablet 0.1 mg  0.1 mg Oral TID Deneise Lever, MD      . heparin injection 5,000 Units  5,000 Units Subcutaneous 3 times per day Deneise Lever, MD      . hydroxychloroquine (PLAQUENIL) tablet 200 mg  200 mg Oral Daily Deneise Lever, MD      . insulin aspart (novoLOG) injection 0-9 Units  0-9 Units Subcutaneous 6 times per day Deneise Lever, MD      . Insulin Glargine SOPN 40 Units  40 Units Subcutaneous QHS Deneise Lever, MD      . lamoTRIgine (LAMICTAL) tablet 150 mg  150 mg Oral q morning - 10a Deneise Lever, MD      . metroNIDAZOLE (FLAGYL) IVPB 500 mg  500 mg Intravenous Q8H Rolland Porter, MD      . ondansetron (ZOFRAN) tablet 4 mg  4 mg Oral Q6H PRN Deneise Lever, MD       Or  . ondansetron [facility]) injection 4 mg  4 mg Intravenous Q6H PRN Deneise Lever, MD      . pantoprazole (PROTONIX) EC tablet 40 mg  40 mg Oral Daily Deneise Lever, MD      . pregabalin (LYRICA) capsule 75 mg  75 mg Oral BID Deneise Lever, MD      . sertraline (ZOLOFT) tablet 100  mg  100 mg Oral q morning - 10a Deneise Lever, MD      . thioridazine (MELLARIL)  tablet 20 mg  20 mg Oral QHS Deneise Lever, MD      . traZODone (DESYREL) tablet 100 mg  100 mg Oral QHS Deneise Lever, MD       Current Outpatient Prescriptions  Medication Sig Dispense Refill  . aspirin EC 81 MG tablet Take 81 mg by mouth daily.    [name] b complex vitamins tablet Take 1 tablet by mouth daily.    . Cholecalciferol (D 5000) 5000 UNITS capsule Take 5,000 Units by mouth daily.    . cloNIDine (CATAPRES) 0.3 MG tablet TAKE 1 TABLET BY MOUTH EVERY EIGHT HOURS. ONCE AT 8AM, 4PM, AND 12 MIDNIGHT. 90 tablet 3  . CRESTOR 5 MG tablet TAKE 1 TABLET BY MOUTH AT BEDTIME. 30 tablet 2  . diclofenac sodium (VOLTAREN) 1 % GEL Apply 2 g topically daily as needed (Pain).    [name] dicyclomine (BENTYL) 10 MG capsule TAKE 1 CAPSULE BY MOUTH FOUR TIMES DAILY BEFORE MEALS AND AT BEDTIME. 120 capsule 3  . fluticasone (FLONASE) 50 MCG/ACT nasal spray Place 2 sprays into both nostrils 2 (two) times daily.    [name] glucose blood (ACCU-CHEK AVIVA PLUS) test strip Use to test blood sugar 4 times daily as instructed. Dx code: E10.65 400 each 3  . hydrochlorothiazide (HYDRODIURIL) 25 MG tablet TAKE 1 TABLET BY MOUTH ONCE DAILY. 30 tablet 4  . HYDROmorphone (DILAUDID) 4 MG tablet Take 4 mg by mouth 3 (three) times daily as needed. For pain    . hydroxychloroquine (PLAQUENIL) 200 MG tablet Take 200 mg by mouth daily.    . insulin aspart (NOVOLOG FLEXPEN) 100 UNIT/ML FlexPen Inject 12-18 Units into the skin 3 (three) times daily with meals. 45 mL 1  . Insulin Glargine (TOUJEO SOLOSTAR) 300 UNIT/ML SOPN Inject 40 Units into the skin at bedtime. 12 pen 2  . Insulin Pen Needle (UNIFINE PENTIPS) 31G X 6 MM MISC Use to inject insulin 4 times daily as directed. 390 each 3  . JANUVIA 25 MG tablet Take 25 mg by mouth daily.    [name] lamoTRIgine (LAMICTAL) 100 MG tablet Take 1.5 tablets (150 mg total) by mouth every morning. 45 tablet 5  . levothyroxine (LEVOXYL) 50 MCG tablet Take 1 tablet (50 mcg total) by mouth every morning. 30  tablet 3  . LORazepam (ATIVAN) 0.5 MG tablet Take 0.5 mg by mouth at bedtime.     [name] losartan (COZAAR) 100 MG tablet TAKE 1 TABLET BY MOUTH ONCE DAILY. 30 tablet 3  . meclizine (ANTIVERT) 12.5 MG tablet Take 1 tablet (12.5 mg total) by mouth 3 (three) times daily as needed. For nausea 30 tablet 0  . methocarbamol (ROBAXIN) 500 MG tablet Take 500 mg by mouth every morning.     . metoprolol (LOPRESSOR) 50 MG tablet TAKE 1 TABLET BY MOUTH TWICE DAILY. 60 tablet 5  . mirtazapine (REMERON) 30 MG tablet Take 30 mg by mouth at bedtime.     . mometasone-formoterol (DULERA) 100-5 MCG/ACT AERO Inhale 2 puffs into the lungs 2 (two) times daily. 1 Inhaler 0  . montelukast (SINGULAIR) 10 MG tablet TAKE ONE TABLET BY MOUTH ONCE DAILY. 30 tablet 2  . Multiple Vitamins-Minerals (ONE-A-DAY 50 PLUS PO) Take 1 tablet by mouth daily.    [name] nystatin (MYCOSTATIN) powder Apply topically 4 (four) times daily. 30 g 0  . pregabalin (LYRICA) 75  MG capsule Take 75 mg by mouth 2 (two) times daily.     . RABEprazole (ACIPHEX) 20 MG tablet TAKE 1 TABLET BY MOUTH TWICE DAILY. 60 tablet 5  . RESTASIS 0.05 % ophthalmic emulsion Place 1 drop into both eyes 2 (two) times daily.     . sertraline (ZOLOFT) 100 MG tablet Take 100 mg by mouth every morning.     [name] spironolactone (ALDACTONE) 25 MG tablet TAKE ONE TABLET BY MOUTH DAILY. 30 tablet 2  . thioridazine (MELLARIL) 10 MG tablet Take 20 mg by mouth at bedtime.     . traZODone (DESYREL) 100 MG tablet Take 100 mg by mouth at bedtime.    . varenicline (CHANTIX PAK) 0.5 MG X 11 & 1 MG X 42 tablet Take one 0.5 mg tablet by mouth once daily for 3 days, then increase to one 0.5 mg tablet twice daily for 4 days, then increase to one 1 mg tablet twice daily. 53 tablet 0  . ZETIA 10 MG tablet TAKE ONE TABLET BY MOUTH ONCE DAILY. 30 tablet 2   Review Of Systems: 12 point ROS negative except as noted above in HPI.  Physical Exam: Filed Vitals:   06/01/14 2225  BP: 191/66  Pulse: 67   Temp: 98.1 F (36.7 C)  Resp: 20    General: alert and cooperative HEENT: PERRLA, extra ocular movement intact and dry oral mucosa Heart: S1, S2 normal, no murmur, rub or gallop, regular rate and rhythm Lungs: clear to auscultation, no wheezes or rales and unlabored breathing Abdomen: + bowel sounds, minimal abd TTP  Extremities: extremities normal, atraumatic, no cyanosis or edema Skin:no rashes Neurology: normal without focal findings  Labs and Imaging: Lab Results  Component Value Date/Time   NA 139 06/02/2014 12:30 AM   K 4.3 06/02/2014 12:30 AM   CL 104 06/02/2014 12:30 AM   CO2 26 06/02/2014 12:30 AM   BUN 21 06/02/2014 12:30 AM   CREATININE 1.06 06/02/2014 12:30 AM   CREATININE 1.04 12/19/2013 10:39 AM   GLUCOSE 208* 06/02/2014 12:30 AM   Lab Results  Component Value Date   WBC 10.1 06/02/2014   HGB 12.0 06/02/2014   HCT 39.4 06/02/2014   MCV 73.9* 06/02/2014   PLT 277 06/02/2014    Ct Abdomen Pelvis W Contrast  06/02/2014   CLINICAL DATA:  Initial evaluation for acute sudden onset shooting and cramping abdominal pain with associated nausea and vomiting.  EXAM: CT ABDOMEN AND PELVIS WITH CONTRAST  TECHNIQUE: Multidetector CT imaging of the abdomen and pelvis was performed using the standard protocol following bolus administration of intravenous contrast.  CONTRAST:  12mL OMNIPAQUE IOHEXOL 300 MG/ML SOLN, 142mL OMNIPAQUE IOHEXOL 300 MG/ML SOLN  COMPARISON:  Prior CT from 07/16/2012  FINDINGS: The visualized lung bases are clear. No pleural or pericardial effusion. Scattered coronary artery and valvular calcifications noted.  The liver demonstrates a normal contrast enhanced appearance. Gallbladder is absent. No biliary dilatation. Spleen, adrenal glands, and pancreas demonstrate a normal contrast enhanced appearance.  Kidneys are equal in size with symmetric enhancement. No nephrolithiasis, hydronephrosis, or focal enhancing renal mass.  Stomach within normal limits. No  evidence for bowel obstruction. Appendix well visualized within the right lower quadrant and is of normal caliber and appearance without associated inflammatory changes to suggest acute appendicitis. There is mild hazy fat stranding about several loops of small bowel clustered in the lower mid abdomen just to the right of midline (series 2, image 57). Findings are nonspecific, but suggestive  of possible acute enteritis. Small volume free fluid along the right pericolic fascia noted, which may be reactive from this process. The transverse colon traverses this region, but is favored to be within normal limits, and not the source of inflammation.  Bladder within normal limits.  Uterus and ovaries are absent.  No free intraperitoneal air. No pathologically enlarged intra-abdominal pelvic lymph nodes. Normal intravascular enhancement seen within the abdomen and pelvis. Scattered atherosclerotic plaque noted within the aorta.  No acute osseous abnormality. No worrisome lytic or blastic osseous lesions. Scattered multilevel degenerative changes noted within the visualized spine.  IMPRESSION: 1. Hazy fat stranding about several loops of small bowel in the lower mid abdomen. Finding is nonspecific, but suggestive of possible acute enteritis. 2. No other acute abnormality within the abdomen and pelvis. 3. Status post cholecystectomy.   Electronically Signed   By: Jeannine Boga M.D.   On: 06/02/2014 02:44   Dg Chest Portable 1 View  06/02/2014   CLINICAL DATA:  Acute sudden onset abdominal pain  EXAM: PORTABLE CHEST - 1 VIEW  COMPARISON:  05/05/2014  FINDINGS: A single AP portable view of the chest demonstrates no focal airspace consolidation or alveolar edema. The lungs are grossly clear. There is no large effusion or pneumothorax. Cardiac and mediastinal contours appear unremarkable.  IMPRESSION: No active disease.   Electronically Signed   By: Andreas Newport M.D.   On: 06/02/2014 00:40            Shanda Howells MD  Pager: [phone]

## 2014-06-02 NOTE — Progress Notes (Signed)
UR completed 

## 2014-06-03 ENCOUNTER — Other Ambulatory Visit (HOSPITAL_COMMUNITY): Payer: Medicare (Managed Care)

## 2014-06-03 ENCOUNTER — Other Ambulatory Visit: Payer: Self-pay

## 2014-06-03 DIAGNOSIS — D649 Anemia, unspecified: Secondary | ICD-10-CM

## 2014-06-03 DIAGNOSIS — I10 Essential (primary) hypertension: Secondary | ICD-10-CM

## 2014-06-03 DIAGNOSIS — R112 Nausea with vomiting, unspecified: Secondary | ICD-10-CM

## 2014-06-03 DIAGNOSIS — R1084 Generalized abdominal pain: Secondary | ICD-10-CM

## 2014-06-03 DIAGNOSIS — D509 Iron deficiency anemia, unspecified: Secondary | ICD-10-CM

## 2014-06-03 LAB — COMPREHENSIVE METABOLIC PANEL
ALT: 15 U/L (ref 0–35)
AST: 17 U/L (ref 0–37)
Albumin: 3.2 g/dL — ABNORMAL LOW (ref 3.5–5.2)
Alkaline Phosphatase: 75 U/L (ref 39–117)
Anion gap: 6 (ref 5–15)
BUN: 12 mg/dL (ref 6–23)
CALCIUM: 8 mg/dL — AB (ref 8.4–10.5)
CO2: 24 mmol/L (ref 19–32)
CREATININE: 1.01 mg/dL (ref 0.50–1.10)
Chloride: 112 mmol/L (ref 96–112)
GFR calc non Af Amer: 58 mL/min — ABNORMAL LOW (ref >90)
GFR, EST AFRICAN AMERICAN: 67 mL/min — AB (ref >90)
GLUCOSE: 86 mg/dL (ref 70–99)
Potassium: 3.5 mmol/L (ref 3.5–5.1)
Sodium: 142 mmol/L (ref 135–145)
Total Bilirubin: 0.5 mg/dL (ref 0.3–1.2)
Total Protein: 6.2 g/dL (ref 6.0–8.3)

## 2014-06-03 LAB — HEMOGLOBIN A1C
Hgb A1c MFr Bld: 7.3 % — ABNORMAL HIGH (ref 4.8–5.6)
Mean Plasma Glucose: 163 mg/dL

## 2014-06-03 LAB — CBC WITH DIFFERENTIAL/PLATELET
BASOS ABS: 0 10*3/uL (ref 0.0–0.1)
Basophils Relative: 0 % (ref 0–1)
EOS ABS: 0.2 10*3/uL (ref 0.0–0.7)
Eosinophils Relative: 4 % (ref 0–5)
HCT: 30.3 % — ABNORMAL LOW (ref 36.0–46.0)
Hemoglobin: 8.9 g/dL — ABNORMAL LOW (ref 12.0–15.0)
Lymphocytes Relative: 42 % (ref 12–46)
Lymphs Abs: 2.1 10*3/uL (ref 0.7–4.0)
MCH: 22.4 pg — ABNORMAL LOW (ref 26.0–34.0)
MCHC: 29.4 g/dL — AB (ref 30.0–36.0)
MCV: 76.1 fL — ABNORMAL LOW (ref 78.0–100.0)
Monocytes Absolute: 0.4 10*3/uL (ref 0.1–1.0)
Monocytes Relative: 9 % (ref 3–12)
NEUTROS PCT: 46 % (ref 43–77)
Neutro Abs: 2.4 10*3/uL (ref 1.7–7.7)
PLATELETS: 204 10*3/uL (ref 150–400)
RBC: 3.98 MIL/uL (ref 3.87–5.11)
RDW: 14.6 % (ref 11.5–15.5)
WBC: 5.1 10*3/uL (ref 4.0–10.5)

## 2014-06-03 LAB — GLUCOSE, CAPILLARY
GLUCOSE-CAPILLARY: 102 mg/dL — AB (ref 70–99)
GLUCOSE-CAPILLARY: 81 mg/dL (ref 70–99)
GLUCOSE-CAPILLARY: 86 mg/dL (ref 70–99)
GLUCOSE-CAPILLARY: 96 mg/dL (ref 70–99)
Glucose-Capillary: 64 mg/dL — ABNORMAL LOW (ref 70–99)
Glucose-Capillary: 91 mg/dL (ref 70–99)
Glucose-Capillary: 109 mg/dL — ABNORMAL HIGH (ref 70–99)

## 2014-06-03 MED ORDER — DEXTROSE-NACL 5-0.45 % IV SOLN
INTRAVENOUS | Status: DC
  Administered 2014-06-03 – 2014-06-04 (×3): via INTRAVENOUS

## 2014-06-03 MED ORDER — MILK AND MOLASSES ENEMA
1.0000 | Freq: Once | RECTAL | Status: AC
  Administered 2014-06-03: 250 mL via RECTAL

## 2014-06-03 NOTE — Progress Notes (Signed)
MD called and stated to change pt's fluids to D5 1/2 NS due to low blood sugars.

## 2014-06-03 NOTE — Progress Notes (Signed)
Inpatient Diabetes Program Recommendations  AACE/ADA: New Consensus Statement on Inpatient Glycemic Control (2013)  Target Ranges:  Prepandial:   less than 140 mg/dL      Peak postprandial:   less than 180 mg/dL (1-2 hours)      Critically ill patients:  140 - 180 mg/dL   Results for Magallanes, MALCOLM HETZ (MRN [id number]) as of 06/03/2014 09:18  Ref. Range 06/02/2014 08:19 06/02/2014 11:55 06/02/2014 16:09 06/02/2014 20:27 06/03/2014 00:29 06/03/2014 04:18 06/03/2014 05:21 06/03/2014 07:58  Glucose-Capillary Latest Range: 70-99 mg/dL 140 (H) 140 (H) 118 (H) 82 81 64 (L) 86 91    Diabetes history: DM2 Outpatient Diabetes medications:Toujeo 40 units QHS, Novolog 12-18 TID, Januvia 25 mg daily Current orders for Inpatient glycemic control:Lantus 40 units QHS, Novolog 0-9 units Q4H  Inpatient Diabetes Program Recommendations Insulin - Basal: Noted fasting glucose 64 mg/dl this morning. Please consider decreasing Lantus to 32 units QHS.  Thanks, Barnie Alderman, RN, MSN, CCRN, CDE Diabetes Coordinator Inpatient Diabetes Program [phone] (Team Pager from [place] to [place]) [phone] ([facility] office) [phone] [facility] office)

## 2014-06-03 NOTE — Consult Note (Signed)
Referring Provider: Kathie Dike, MD Primary Care Physician:  Tula Nakayama, MD Primary Gastroenterologist:  Dr. Laural Golden  Reason for Consultation:    Abdominal pain nausea and vomiting.  HPI:   Patient is 64 year old African-American female presented to emergency room on the evening of 06/01/2014 with few are history of upper abdominal pain associated with nausea and vomiting. She noted radiation of this pain to epigastric region. He had nausea and vomiting. She vomited fluid and bile but no food, had or coffee-ground material. She has chronic constipation with need to strain every time. She had to formed stools before she came to emergency room. She had diaphoreses and chills but no fever. She did not experience hematuria dysuria or vaginal discharge since her symptoms began. She tried on clear liquids today but she noted nausea bloating and more pain. She did not vomit. She states she's had more bloating in the last when a half months. She has problems with chronic constipation and has been intolerant of Linzess or Amitiza although which resulted in fecal incontinence. Lately she has been trying prone juice and MOM daily to make her bowels move. He denies using other OTC laxatives or herbal medications. Her appetite is not good but she has not lost any weight recently. He also has history of IBS and remains on dicyclomine. He states years ago she is to have diarrhea and urgency but not anymore. She is on pain medication for chronic back pain. She states heartburn is well controlled with AcipHex. She has history of iron deficiency anemia and heme positive stool. She had EGD and colonoscopy on 09/25/2012 Dr. Gala Romney at which time she was also complaining of dysphagia. No structural abnormality noted to esophagus but it was dilated. No bleeding lesion identified on EGD or colonoscopy. She did have small adenoma removed from her colon. She underwent small bowel given capsule study in October 2014  and was within normal limits. She was also scheduled for hydrogen breath test for SIBO on 04/28/2013 but I am unable to locate the report.  Patient lives alone. She has daily help for 6 hours. She smoked for 6 years from age 62 to 30 and then quit for 17 years and went back and was able to quit again 3 months ago. She does not drink alcohol. She is is divorced. She has one son who lives in [place]. Half sister died of hepatic carcinoma in her 54s and another half-sister had breast cancer.   Past Medical History  Diagnosis Date  . Bipolar disorder   . CVA (cerebral infarction)   . Pancreatitis 2006    due to Depakote with normal EUS   . Osteoporosis   . Chronic back pain   . Diabetes mellitus   . Trigger finger   . Anxiety disorder   . Hypertension   . Migraines     chronic headaches  . Diverticulosis     TCS 9/08 by Dr. Delfin Edis for diarrhea . Bx for micro scopic colitis negative.   . Schatzki's ring     non critical / EGD with ED 8/2011with RMR  . S/P colonoscopy 8413,2440, 2011    left-sided diverticula, hx of simple adenomas . 2011, random bx negative for microscopic colitis  . Glaucoma   . Allergic rhinitis   . Hypothyroidism     thyroid goiter  . Anemia   . Blood transfusion   . GERD (gastroesophageal reflux disease)   . Stroke     left sided weakness  .  Seizures     unknown etiology-on meds-last seizure was 3 yerars ago  . Anxiety   . Depression   . Metabolic encephalopathy 4/40/1027  . Sleep apnea     on CPAP  . Arthritis   . Gum symptoms     infection on antibiotic  . Chronic neck pain   . Mononeuritis lower limb   . Frequent falls     Past Surgical History  Procedure Laterality Date  . Abdominal hysterectomy  1978  . Cholecystectomy  1984  . Ovarian cyst removal    . Carpal tunnel release Left 07/22/04    Dr. Aline Brochure  . Breast reduction surgery  1994  . Cataract extraction Bilateral   . Biopsy thyroid  2009  . Surgical  excision of 3 tumors from right thigh and right buttock  and left upper thigh  2010  . Back surgery  July 2012  . Spine surgery  09/29/2010    Dr. Rolena Infante  . Maloney dilation  12/29/2010    RMR;  . Esophagogastroduodenoscopy  12/29/2010    Rourk-Retained food in the esophagus and stomach, small hiatal hernia, status post Maloney dilation of the esophagus  . Craniotomy  11/23/2011    Procedure: CRANIOTOMY TUMOR EXCISION;  Surgeon: Hosie Spangle, MD;  Location: [facility];  Service: Neurosurgery;  Laterality: N/A;  Craniotomy for tumor resection  . Brain surgery  11/2011    resection of meningioma  . Colonoscopy N/A 09/25/2012    OZD:GUYQIHK diverticulosis.  colonic polyp-removed : tubular adenoma  . Esophagogastroduodenoscopy N/A 09/25/2012    VQQ:[id number] atonic baggy esophagus status post Maloney dilation 4 F. Hiatal hernia  . Givens capsule study N/A 01/15/2013    NORMAL.   . Bacterial overgrowth test N/A 05/05/2013    Procedure: BACTERIAL OVERGROWTH TEST;  Surgeon: Daneil Dolin, MD;  Location: [facility];  Service: Endoscopy;  Laterality: N/A;  7:30  . Transthoracic echocardiogram  2010    EF 60-65%, mild conc LVH, grade 1 diastolic dysfunction; mildly calcified MV annulus with mildly thickened leaflets, mildly calcified MR annulus  . Nm myocar perf wall motion  2006    "relavtiely normal" persantine, mild anterior thinning (breast attenuation artifact), no region of scar/ischemia  . Cardiac catheterization  05/10/2005    normal coronaries, normal LV systolic function and EF (Dr. Jackie Plum)    Prior to Admission medications   Medication Sig Start Date End Date Taking? Authorizing Provider  aspirin EC 81 MG tablet Take 81 mg by mouth daily.   Yes Historical Provider, MD  Cholecalciferol (D 5000) 5000 UNITS capsule Take 5,000 Units by mouth daily.   Yes Historical Provider, MD  cloNIDine (CATAPRES) 0.3 MG tablet TAKE 1 TABLET BY MOUTH EVERY EIGHT HOURS. ONCE AT 8AM, [place], AND 12  MIDNIGHT. 05/12/14  Yes [name], MD  CRESTOR 5 MG tablet TAKE 1 TABLET BY MOUTH AT BEDTIME. 05/25/14  Yes [name], MD  diclofenac sodium (VOLTAREN) 1 % GEL Apply 2 g topically daily as needed (Pain).   Yes Historical Provider, MD  fluticasone (FLONASE) 50 MCG/ACT nasal spray Place 2 sprays into both nostrils daily.   Yes Historical Provider, MD  hydrochlorothiazide (HYDRODIURIL) 25 MG tablet TAKE 1 TABLET BY MOUTH ONCE DAILY. 04/30/14  Yes [name], MD  HYDROmorphone (DILAUDID) 4 MG tablet Take 4 mg by mouth 3 (three) times daily as needed for moderate pain.  02/20/14  Yes Historical Provider, MD  hydroxychloroquine (PLAQUENIL) 200 MG tablet  Take 200 mg by mouth daily.   Yes Historical Provider, MD  insulin aspart (NOVOLOG FLEXPEN) 100 UNIT/ML FlexPen Inject 12-18 Units into the skin 3 (three) times daily with meals. Patient taking differently: Inject 16-22 Units into the skin 3 (three) times daily with meals.  04/06/14  Yes Philemon Kingdom, MD  Insulin Glargine (TOUJEO SOLOSTAR) 300 UNIT/ML SOPN Inject 40 Units into the skin at bedtime. Patient taking differently: Inject 50 Units into the skin at bedtime.  04/10/14  Yes Philemon Kingdom, MD  lamoTRIgine (LAMICTAL) 100 MG tablet Take 1.5 tablets (150 mg total) by mouth every morning. 02/23/14  Yes Pieter Partridge, DO  levothyroxine (LEVOXYL) 50 MCG tablet Take 1 tablet (50 mcg total) by mouth every morning. 05/05/14  Yes Philemon Kingdom, MD  LORazepam (ATIVAN) 0.5 MG tablet Take 0.5 mg by mouth at bedtime.    Yes Historical Provider, MD  losartan (COZAAR) 100 MG tablet TAKE 1 TABLET BY MOUTH ONCE DAILY. 02/09/14  Yes [name], MD  meclizine (ANTIVERT) 12.5 MG tablet Take 1 tablet (12.5 mg total) by mouth 3 (three) times daily as needed. For nausea 03/11/14  Yes [name], MD  methocarbamol (ROBAXIN) 500 MG tablet Take 500 mg by mouth every morning.    Yes Historical Provider, MD  metoprolol (LOPRESSOR) 50  MG tablet TAKE 1 TABLET BY MOUTH TWICE DAILY. 03/05/14  Yes Pixie Casino, MD  mirtazapine (REMERON) 30 MG tablet Take 30 mg by mouth at bedtime.    Yes Historical Provider, MD  mometasone-formoterol (DULERA) 100-5 MCG/ACT AERO Inhale 2 puffs into the lungs 2 (two) times daily. 10/30/13  Yes Deneise Lever, MD  montelukast (SINGULAIR) 10 MG tablet TAKE ONE TABLET BY MOUTH ONCE DAILY. 05/25/14  Yes [name], MD  Multiple Vitamins-Minerals (ONE-A-DAY 50 PLUS PO) Take 1 tablet by mouth daily.   Yes Historical Provider, MD  nystatin (MYCOSTATIN) powder Apply topically 4 (four) times daily. Patient taking differently: Apply 1 g topically daily. After each bath. 03/11/14  Yes [name], MD  pregabalin (LYRICA) 75 MG capsule Take 75 mg by mouth 2 (two) times daily.  10/26/11  Yes Carole Civil, MD  RABEprazole (ACIPHEX) 20 MG tablet TAKE 1 TABLET BY MOUTH TWICE DAILY. 01/12/14  Yes Mahala Menghini, PA-C  RESTASIS 0.05 % ophthalmic emulsion Place 1 drop into both eyes 2 (two) times daily.  02/04/14  Yes Historical Provider, MD  sertraline (ZOLOFT) 100 MG tablet Take 100 mg by mouth every morning.    Yes Historical Provider, MD  spironolactone (ALDACTONE) 25 MG tablet TAKE ONE TABLET BY MOUTH DAILY. 05/25/14  Yes [name], MD  thioridazine (MELLARIL) 10 MG tablet Take 20 mg by mouth at bedtime.  08/16/12  Yes Historical Provider, MD  traZODone (DESYREL) 100 MG tablet Take 100 mg by mouth at bedtime. 04/13/14  Yes Historical Provider, MD  varenicline (CHANTIX PAK) 0.5 MG X 11 & 1 MG X 42 tablet Take one 0.5 mg tablet by mouth once daily for 3 days, then increase to one 0.5 mg tablet twice daily for 4 days, then increase to one 1 mg tablet twice daily. 05/05/14  Yes Deneise Lever, MD  ZETIA 10 MG tablet TAKE ONE TABLET BY MOUTH ONCE DAILY. 05/25/14  Yes [name], MD  dicyclomine (BENTYL) 10 MG capsule TAKE 1 CAPSULE BY MOUTH FOUR TIMES DAILY BEFORE MEALS AND AT  BEDTIME. Patient not taking: Reported on 06/03/2014 05/25/14   Vicente Males  Sema Sable, NP  glucose blood (ACCU-CHEK AVIVA PLUS) test strip Use to test blood sugar 4 times daily as instructed. Dx code: E10.65 01/22/14   Philemon Kingdom, MD  Insulin Pen Needle (UNIFINE PENTIPS) 31G X 6 MM MISC Use to inject insulin 4 times daily as directed. 01/22/14   Philemon Kingdom, MD    Current Facility-Administered Medications  Medication Dose Route Frequency Provider Last Rate Last Dose  . 0.9 %  sodium chloride infusion   Intravenous Continuous Deneise Lever, MD 125 mL/hr at 06/02/14 1535    . aspirin EC tablet 81 mg  81 mg Oral Daily Deneise Lever, MD   81 mg at 06/03/14 8115  . ciprofloxacin (CIPRO) IVPB 400 mg  400 mg Intravenous Q12H Rolland Porter, MD 200 mL/hr at 06/03/14 1620 400 mg at 06/03/14 1620  . cloNIDine (CATAPRES) tablet 0.1 mg  0.1 mg Oral TID Deneise Lever, MD   0.1 mg at 06/03/14 1620  . dextrose 5 %-0.45 % sodium chloride infusion   Intravenous Continuous Deneise Lever, MD 100 mL/hr at 06/03/14 0443    . heparin injection 5,000 Units  5,000 Units Subcutaneous 3 times per day Deneise Lever, MD   5,000 Units at 06/03/14 1404  . HYDROmorphone (DILAUDID) tablet 4 mg  4 mg Oral TID PRN Deneise Lever, MD   4 mg at 06/03/14 7262  . hydroxychloroquine (PLAQUENIL) tablet 200 mg  200 mg Oral Daily Deneise Lever, MD   200 mg at 06/03/14 0355  . insulin aspart (novoLOG) injection 0-9 Units  0-9 Units Subcutaneous 6 times per day Deneise Lever, MD   1 Units at 06/02/14 1156  . insulin glargine (LANTUS) injection 40 Units  40 Units Subcutaneous QHS Deneise Lever, MD   40 Units at 06/02/14 2101  . lamoTRIgine (LAMICTAL) tablet 150 mg  150 mg Oral q morning - 10a Deneise Lever, MD   150 mg at 06/03/14 9741  . levothyroxine (SYNTHROID, LEVOTHROID) tablet 50 mcg  50 mcg Oral BH-q7a Deneise Lever, MD   50 mcg at 06/03/14 0531  . metoprolol (LOPRESSOR) tablet 50 mg  50 mg Oral BID Deneise Lever, MD    50 mg at 06/03/14 6384  . metroNIDAZOLE (FLAGYL) IVPB 500 mg  500 mg Intravenous Q8H Rolland Porter, MD 100 mL/hr at 06/03/14 1243 500 mg at 06/03/14 1243  . mometasone-formoterol (DULERA) 100-5 MCG/ACT inhaler 2 puff  2 puff Inhalation BID Deneise Lever, MD   2 puff at 06/03/14 0800  . ondansetron (ZOFRAN) tablet 4 mg  4 mg Oral Q6H PRN Deneise Lever, MD       Or  . ondansetron [facility]) injection 4 mg  4 mg Intravenous Q6H PRN Deneise Lever, MD   4 mg at 06/03/14 1257  . pantoprazole (PROTONIX) EC tablet 40 mg  40 mg Oral Daily Deneise Lever, MD   40 mg at 06/03/14 0936  . pregabalin (LYRICA) capsule 75 mg  75 mg Oral BID Deneise Lever, MD   75 mg at 06/03/14 5364  . sertraline (ZOLOFT) tablet 100 mg  100 mg Oral q morning - 10a Deneise Lever, MD   100 mg at 06/03/14 6803  . thioridazine (MELLARIL) tablet 20 mg  20 mg Oral QHS Deneise Lever, MD   20 mg at 06/02/14 2059  . traZODone (DESYREL) tablet 100 mg  100 mg Oral QHS Deneise Lever, MD   100  mg at 06/02/14 2100    Allergies as of 06/01/2014 - Review Complete 06/01/2014  Allergen Reaction Noted  . Cephalexin Hives   . Iron Nausea And Vomiting 11/15/2012  . Milk-related compounds Other (See Comments) 07/29/2008  . Penicillins Hives   . Phenazopyridine hcl Other (See Comments) 06/19/2007    Family History  Problem Relation Age of Onset  . Heart attack Mother     HTN  . Pneumonia Father   . Kidney failure Father   . Pancreatic cancer Sister   . Diabetes Brother   . Hypertension Brother   . Colon cancer Neg Hx   . Anesthesia problems Neg Hx   . Hypotension Neg Hx   . Malignant hyperthermia Neg Hx   . Pseudochol deficiency Neg Hx   . Stroke Maternal Grandmother   . Heart attack Maternal Grandfather   . Cancer Sister   . Hypertension Son     History   Social History  . Marital Status: Divorced    Spouse Name: N/A  . Number of Children: 1  . Years of Education: 12   Occupational History  . disabled      Social History Main Topics  . Smoking status: Current Every Day Smoker -- 0.25 packs/day for 7 years    Types: Cigarettes  . Smokeless tobacco: Never Used     Comment: "started back but off now for 3 months" (08/18/13)  . Alcohol Use: No     Comment:    . Drug Use: No  . Sexual Activity: No   Other Topics Concern  . Not on file   Social History Narrative    Review of Systems: See HPI, otherwise normal ROS  Physical Exam: Temp:  [98.4 F (36.9 C)-99.4 F (37.4 C)] 98.4 F (36.9 C) (03/23 1410) Pulse Rate:  [59-60] 59 (03/23 1410) Resp:  [20] 20 (03/23 1410) BP: (128-153)/(63-73) 153/65 mmHg (03/23 1410) SpO2:  [94 %-99 %] 95 % (03/23 1410) Patient is alert and in no acute distress. She was on the phone when I walked into her room. Conjunctiva is pink and sclerae nonicteric. Oropharyngeal mucosa is normal. She is edentulous and has dentures which she is not using at present time. No neck masses or thyromegaly noted. Cardiac exam with regular rhythm normal S1 and S2. No murmur or gallop noted. Lungs are clear to auscultation. Abdomen is protuberant. Bowel sounds are hyperactive. On palpation abdomen is soft with [place] moderate tenderness across upper abdomen which is most pronounced in right mid abdomen without guarding or rebound. No organomegaly or masses. Rectal examination reveals scant amount of soft stool within reach and it is guaiac-negative. No LE edema or clubbing noted.  Lab Results:  Recent Labs  06/02/14 0030 06/02/14 0604 06/03/14 0551  WBC 10.1 9.4 5.1  HGB 12.0 10.1* 8.9*  HCT 39.4 33.5* 30.3*  PLT 277 270 204   BMET  Recent Labs  06/02/14 0030 06/02/14 0604 06/03/14 0551  NA 139 139 142  K 4.3 4.1 3.5  CL 104 107 112  CO2 26 23 24   GLUCOSE 208* 160* 86  BUN 21 17 12   CREATININE 1.06 0.91 1.01  CALCIUM 9.6 8.3* 8.0*   LFT  Recent Labs  06/03/14 0551  PROT 6.2  ALBUMIN 3.2*  AST 17  ALT 15  ALKPHOS 75  BILITOT 0.5     Studies/Results: Ct Abdomen Pelvis W Contrast  06/02/2014   CLINICAL DATA:  Initial evaluation for acute sudden onset shooting and cramping abdominal  pain with associated nausea and vomiting.  EXAM: CT ABDOMEN AND PELVIS WITH CONTRAST  TECHNIQUE: Multidetector CT imaging of the abdomen and pelvis was performed using the standard protocol following bolus administration of intravenous contrast.  CONTRAST:  73mL OMNIPAQUE IOHEXOL 300 MG/ML SOLN, 177mL OMNIPAQUE IOHEXOL 300 MG/ML SOLN  COMPARISON:  Prior CT from 07/16/2012  FINDINGS: The visualized lung bases are clear. No pleural or pericardial effusion. Scattered coronary artery and valvular calcifications noted.  The liver demonstrates a normal contrast enhanced appearance. Gallbladder is absent. No biliary dilatation. Spleen, adrenal glands, and pancreas demonstrate a normal contrast enhanced appearance.  Kidneys are equal in size with symmetric enhancement. No nephrolithiasis, hydronephrosis, or focal enhancing renal mass.  Stomach within normal limits. No evidence for bowel obstruction. Appendix well visualized within the right lower quadrant and is of normal caliber and appearance without associated inflammatory changes to suggest acute appendicitis. There is mild hazy fat stranding about several loops of small bowel clustered in the lower mid abdomen just to the right of midline (series 2, image 57). Findings are nonspecific, but suggestive of possible acute enteritis. Small volume free fluid along the right pericolic fascia noted, which may be reactive from this process. The transverse colon traverses this region, but is favored to be within normal limits, and not the source of inflammation.  Bladder within normal limits.  Uterus and ovaries are absent.  No free intraperitoneal air. No pathologically enlarged intra-abdominal pelvic lymph nodes. Normal intravascular enhancement seen within the abdomen and pelvis. Scattered atherosclerotic plaque noted  within the aorta.  No acute osseous abnormality. No worrisome lytic or blastic osseous lesions. Scattered multilevel degenerative changes noted within the visualized spine.  IMPRESSION: 1. Hazy fat stranding about several loops of small bowel in the lower mid abdomen. Finding is nonspecific, but suggestive of possible acute enteritis. 2. No other acute abnormality within the abdomen and pelvis. 3. Status post cholecystectomy.   Electronically Signed   By: Jeannine Boga M.D.   On: 06/02/2014 02:44   Dg Chest Portable 1 View  06/02/2014   CLINICAL DATA:  Acute sudden onset abdominal pain  EXAM: PORTABLE CHEST - 1 VIEW  COMPARISON:  05/05/2014  FINDINGS: A single AP portable view of the chest demonstrates no focal airspace consolidation or alveolar edema. The lungs are grossly clear. There is no large effusion or pneumothorax. Cardiac and mediastinal contours appear unremarkable.  IMPRESSION: No active disease.   Electronically Signed   By: Andreas Newport M.D.   On: 06/02/2014 00:40   I have reviewed imaging studies. Both celiac trunk and SMA appear to be patent.  Assessment;  #1. Acute onset of abdominal pain with nausea and vomiting appears to be secondary to enteritis given CT findings. Abdominal exam is negative for peritoneal signs.  #2. Anemia. Hemoglobin has dropped by 2 g since admission but she may have been dehydrated. No evidence of overt GI bleed and stool is guaiac negative. She has history of iron deficiency anemia with with EGD and colonoscopy in July 2014 removal of small tubular adenoma and subsequently small bowel given capsule study was normal in October 2014. Suspect she may have impaired iron absorption secondary to chronic PPI therapy. If iron studies revealed iron deficiency she may benefit from parenteral iron.  Recommendations;  Continue clear liquids for now. Proceed with milk of molasses enema as ordered by Dr. Roderic Palau. Await results of iron studies. CBC with  differential in a.m.   LOS: 1 day   Zaraya Delauder U  06/03/2014, 4:58 PM

## 2014-06-03 NOTE — Progress Notes (Signed)
Pt is diabetic and NPO. Pt blood sugar is 64. Notified MD, awaiting response.

## 2014-06-03 NOTE — Progress Notes (Signed)
[facility] PROGRESS NOTE  [name] NOM:[id number] DOB: 1950-03-30 DOA: 06/01/2014 PCP: Tula Nakayama, MD  Assessment/Plan: 1. Enteritis. Likely infectious source. Continue ciprofloxacin and Flagyl. Since patient feels worse today, and will request gastroenterology input. Continue clear liquids as tolerated. She is not having any bowel movements. She'll be given a milk and molasses enema. 2. Nausea and vomiting. Related to #1. Continue supportive treatment with antiemetics. 3. Anemia, suspect iron deficiency. Iron studies have been ordered. Continue to follow. He does not have any evidence of bleeding. 4. Diabetes. Had episode of hypoglycemia this morning. Started on dextrose infusion. Continue to monitor blood sugars 5. Hypertension. Stable. Continue outpatient regimen 6. Bipolar disorder. Stable. 7. Seizure disorder. Stable  Code Status: full code Family Communication: discussed with patient Disposition Plan: discharge home once improved   Consultants:    Procedures:    Antibiotics:  cipro 3/22>>  Flagyl 3/22>>  HPI/Subjective: Continues to have abdominal pain, nausea and vomiting. Unable to tolerate liquids today. No bowel movement since before admission  Objective: Filed Vitals:   06/03/14 0419  BP: 128/73  Pulse: 60  Temp: 98.7 F (37.1 C)  Resp: 20    Intake/Output Summary (Last 24 hours) at 06/03/14 1351 Last data filed at 06/03/14 1050  Gross per 24 hour  Intake 1645.83 ml  Output   1400 ml  Net 245.83 ml   Filed Weights   06/01/14 2225 06/02/14 0500  Weight: 77.111 kg (170 lb) 79 kg (174 lb 2.6 oz)    Exam:   General:  NAD  Cardiovascular: s1, s2, rrr  Respiratory: cta b  Abdomen: soft, distended, diffusely tender, bs+  Musculoskeletal: no edema b/l   Data Reviewed: Basic Metabolic Panel:  Recent Labs Lab 06/02/14 0030 06/02/14 0604 06/03/14 0551  NA 139 139 142  K 4.3 4.1 3.5  CL 104 107 112  CO2 26 23 24    GLUCOSE 208* 160* 86  BUN 21 17 12   CREATININE 1.06 0.91 1.01  CALCIUM 9.6 8.3* 8.0*   Liver Function Tests:  Recent Labs Lab 06/02/14 0030 06/02/14 0604 06/03/14 0551  AST 24 24 17   ALT 19 18 15   ALKPHOS 111 87 75  BILITOT 0.5 0.4 0.5  PROT 7.9 6.7 6.2  ALBUMIN 4.2 3.3* 3.2*    Recent Labs Lab 06/02/14 0030  LIPASE 23   No results for input(s): AMMONIA in the last 168 hours. CBC:  Recent Labs Lab 06/02/14 0030 06/02/14 0604 06/03/14 0551  WBC 10.1 9.4 5.1  NEUTROABS 8.8* 7.0 2.4  HGB 12.0 10.1* 8.9*  HCT 39.4 33.5* 30.3*  MCV 73.9* 74.0* 76.1*  PLT 277 270 204   Cardiac Enzymes:  Recent Labs Lab 06/02/14 0030  TROPONINI <0.03   BNP (last 3 results) No results for input(s): BNP in the last 8760 hours.  ProBNP (last 3 results) No results for input(s): PROBNP in the last 8760 hours.  CBG:  Recent Labs Lab 06/03/14 0029 06/03/14 0418 06/03/14 0521 06/03/14 0758 06/03/14 1211  GLUCAP 81 64* 86 91 96    Recent Results (from the past 240 hour(s))  MRSA PCR Screening     Status: None   Collection Time: 06/02/14 11:25 AM  Result Value Ref Range Status   MRSA by PCR NEGATIVE NEGATIVE Final    Comment:        The GeneXpert MRSA Assay (FDA approved for NASAL specimens only), is one component of a comprehensive MRSA colonization surveillance program. It is not intended to diagnose MRSA  infection nor to guide or monitor treatment for MRSA infections.      Studies: Ct Abdomen Pelvis W Contrast  06/02/2014   CLINICAL DATA:  Initial evaluation for acute sudden onset shooting and cramping abdominal pain with associated nausea and vomiting.  EXAM: CT ABDOMEN AND PELVIS WITH CONTRAST  TECHNIQUE: Multidetector CT imaging of the abdomen and pelvis was performed using the standard protocol following bolus administration of intravenous contrast.  CONTRAST:  82mL OMNIPAQUE IOHEXOL 300 MG/ML SOLN, 162mL OMNIPAQUE IOHEXOL 300 MG/ML SOLN  COMPARISON:  Prior  CT from 07/16/2012  FINDINGS: The visualized lung bases are clear. No pleural or pericardial effusion. Scattered coronary artery and valvular calcifications noted.  The liver demonstrates a normal contrast enhanced appearance. Gallbladder is absent. No biliary dilatation. Spleen, adrenal glands, and pancreas demonstrate a normal contrast enhanced appearance.  Kidneys are equal in size with symmetric enhancement. No nephrolithiasis, hydronephrosis, or focal enhancing renal mass.  Stomach within normal limits. No evidence for bowel obstruction. Appendix well visualized within the right lower quadrant and is of normal caliber and appearance without associated inflammatory changes to suggest acute appendicitis. There is mild hazy fat stranding about several loops of small bowel clustered in the lower mid abdomen just to the right of midline (series 2, image 57). Findings are nonspecific, but suggestive of possible acute enteritis. Small volume free fluid along the right pericolic fascia noted, which may be reactive from this process. The transverse colon traverses this region, but is favored to be within normal limits, and not the source of inflammation.  Bladder within normal limits.  Uterus and ovaries are absent.  No free intraperitoneal air. No pathologically enlarged intra-abdominal pelvic lymph nodes. Normal intravascular enhancement seen within the abdomen and pelvis. Scattered atherosclerotic plaque noted within the aorta.  No acute osseous abnormality. No worrisome lytic or blastic osseous lesions. Scattered multilevel degenerative changes noted within the visualized spine.  IMPRESSION: 1. Hazy fat stranding about several loops of small bowel in the lower mid abdomen. Finding is nonspecific, but suggestive of possible acute enteritis. 2. No other acute abnormality within the abdomen and pelvis. 3. Status post cholecystectomy.   Electronically Signed   By: Jeannine Boga M.D.   On: 06/02/2014 02:44   Dg  Chest Portable 1 View  06/02/2014   CLINICAL DATA:  Acute sudden onset abdominal pain  EXAM: PORTABLE CHEST - 1 VIEW  COMPARISON:  05/05/2014  FINDINGS: A single AP portable view of the chest demonstrates no focal airspace consolidation or alveolar edema. The lungs are grossly clear. There is no large effusion or pneumothorax. Cardiac and mediastinal contours appear unremarkable.  IMPRESSION: No active disease.   Electronically Signed   By: Andreas Newport M.D.   On: 06/02/2014 00:40    Scheduled Meds: . aspirin EC  81 mg Oral Daily  . ciprofloxacin  400 mg Intravenous Q12H  . cloNIDine  0.1 mg Oral TID  . heparin  5,000 Units Subcutaneous 3 times per day  . hydroxychloroquine  200 mg Oral Daily  . insulin aspart  0-9 Units Subcutaneous 6 times per day  . insulin glargine  40 Units Subcutaneous QHS  . lamoTRIgine  150 mg Oral q morning - 10a  . levothyroxine  50 mcg Oral BH-q7a  . metoprolol  50 mg Oral BID  . metronidazole  500 mg Intravenous Q8H  . milk and molasses  1 enema Rectal Once  . mometasone-formoterol  2 puff Inhalation BID  . pantoprazole  40 mg Oral  Daily  . pregabalin  75 mg Oral BID  . sertraline  100 mg Oral q morning - 10a  . thioridazine  20 mg Oral QHS  . traZODone  100 mg Oral QHS   Continuous Infusions: . sodium chloride 125 mL/hr at 06/02/14 1535  . dextrose 5 % and 0.45% NaCl 100 mL/hr at 06/03/14 0443    Active Problems:   Enteritis   Generalized abdominal pain   Nausea with vomiting    Time spent: 26mins    MEMON,JEHANZEB  [facility] Pager [phone]. If 7PM-7AM, please contact night-coverage at [email or web address], password [facility] 06/03/2014, 1:51 PM  LOS: 1 day

## 2014-06-04 ENCOUNTER — Inpatient Hospital Stay (HOSPITAL_COMMUNITY): Admission: RE | Admit: 2014-06-04 | Payer: Commercial Managed Care - HMO | Source: Ambulatory Visit

## 2014-06-04 LAB — COMPREHENSIVE METABOLIC PANEL WITH GFR
ALT: 16 U/L (ref 0–35)
AST: 19 U/L (ref 0–37)
Albumin: 3.1 g/dL — ABNORMAL LOW (ref 3.5–5.2)
Alkaline Phosphatase: 79 U/L (ref 39–117)
Anion gap: 4 — ABNORMAL LOW (ref 5–15)
BUN: 9 mg/dL (ref 6–23)
CO2: 25 mmol/L (ref 19–32)
Calcium: 8.2 mg/dL — ABNORMAL LOW (ref 8.4–10.5)
Chloride: 110 mmol/L (ref 96–112)
Creatinine, Ser: 0.94 mg/dL (ref 0.50–1.10)
GFR calc Af Amer: 73 mL/min — ABNORMAL LOW
GFR calc non Af Amer: 63 mL/min — ABNORMAL LOW
Glucose, Bld: 119 mg/dL — ABNORMAL HIGH (ref 70–99)
Potassium: 3.7 mmol/L (ref 3.5–5.1)
Sodium: 139 mmol/L (ref 135–145)
Total Bilirubin: 0.4 mg/dL (ref 0.3–1.2)
Total Protein: 6 g/dL (ref 6.0–8.3)

## 2014-06-04 LAB — CBC WITH DIFFERENTIAL/PLATELET
Basophils Absolute: 0 10*3/uL (ref 0.0–0.1)
Basophils Relative: 0 % (ref 0–1)
EOS ABS: 0.3 10*3/uL (ref 0.0–0.7)
EOS PCT: 6 % — AB (ref 0–5)
HEMATOCRIT: 29.8 % — AB (ref 36.0–46.0)
HEMOGLOBIN: 8.9 g/dL — AB (ref 12.0–15.0)
LYMPHS ABS: 2.3 10*3/uL (ref 0.7–4.0)
LYMPHS PCT: 41 % (ref 12–46)
MCH: 22.7 pg — ABNORMAL LOW (ref 26.0–34.0)
MCHC: 29.9 g/dL — ABNORMAL LOW (ref 30.0–36.0)
MCV: 76 fL — AB (ref 78.0–100.0)
MONOS PCT: 10 % (ref 3–12)
Monocytes Absolute: 0.6 10*3/uL (ref 0.1–1.0)
Neutro Abs: 2.4 10*3/uL (ref 1.7–7.7)
Neutrophils Relative %: 43 % (ref 43–77)
Platelets: 190 10*3/uL (ref 150–400)
RBC: 3.92 MIL/uL (ref 3.87–5.11)
RDW: 14.7 % (ref 11.5–15.5)
WBC: 5.7 10*3/uL (ref 4.0–10.5)

## 2014-06-04 LAB — GLUCOSE, CAPILLARY
GLUCOSE-CAPILLARY: 116 mg/dL — AB (ref 70–99)
Glucose-Capillary: 120 mg/dL — ABNORMAL HIGH (ref 70–99)
Glucose-Capillary: 135 mg/dL — ABNORMAL HIGH (ref 70–99)
Glucose-Capillary: 111 mg/dL — ABNORMAL HIGH (ref 70–99)
Glucose-Capillary: 121 mg/dL — ABNORMAL HIGH (ref 70–99)
Glucose-Capillary: 131 mg/dL — ABNORMAL HIGH (ref 70–99)

## 2014-06-04 MED ORDER — PANTOPRAZOLE SODIUM 40 MG PO TBEC
40.0000 mg | DELAYED_RELEASE_TABLET | Freq: Two times a day (BID) | ORAL | Status: DC
  Administered 2014-06-04 – 2014-06-05 (×2): 40 mg via ORAL
  Filled 2014-06-04 (×2): qty 1

## 2014-06-04 MED ORDER — ONDANSETRON HCL 4 MG/2ML IJ SOLN
4.0000 mg | Freq: Three times a day (TID) | INTRAMUSCULAR | Status: DC
  Administered 2014-06-04 – 2014-06-07 (×9): 4 mg via INTRAVENOUS
  Filled 2014-06-04 (×9): qty 2

## 2014-06-04 MED ORDER — POLYETHYLENE GLYCOL 3350 17 G PO PACK
17.0000 g | PACK | ORAL | Status: AC
  Administered 2014-06-04 (×3): 17 g via ORAL
  Filled 2014-06-04 (×3): qty 1

## 2014-06-04 MED ORDER — INSULIN GLARGINE 100 UNIT/ML ~~LOC~~ SOLN
30.0000 [IU] | Freq: Every day | SUBCUTANEOUS | Status: DC
  Administered 2014-06-04 – 2014-06-06 (×3): 30 [IU] via SUBCUTANEOUS
  Filled 2014-06-04 (×4): qty 0.3

## 2014-06-04 NOTE — Progress Notes (Signed)
[facility] PROGRESS NOTE  [name] TKZ:[id number] DOB: March 31, 1950 DOA: 06/01/2014 PCP: Tula Nakayama, MD  Assessment/Plan: 1. Enteritis. Likely infectious source. On ciprofloxacin. She has not had any diarrhea. Seen by gastroenterology and Flagyl discontinued since it may be contributing to nausea. She has been slow to improve. Continue clear liquids as tolerated. She has not had any significant bowel movements after enema. Started on MiraLAX. Antiemetics have been scheduled. 2. Nausea and vomiting. Related to #1. Continue supportive treatment with antiemetics. 3. Anemia, suspect iron deficiency. Iron studies have been ordered. Continue to follow. She does not have any evidence of bleeding. 4. Diabetes. Had episode of hypoglycemia on 3/23. Started on dextrose infusion. We will decrease Lantus dosing and discontinue dextrose. Continue to monitor blood sugars 5. Hypertension. Stable. Continue outpatient regimen 6. Bipolar disorder. Stable. 7. Seizure disorder. Stable  Code Status: full code Family Communication: discussed with patient Disposition Plan: discharge home once improved   Consultants:  Gastroenterology  Procedures:    Antibiotics:  cipro 3/22>>  Flagyl 3/22>> 3/24  HPI/Subjective: She does not feel any better today. Continues to have abdominal pain, nausea. Has vomiting when she tries to eat or drink anything.  Objective: Filed Vitals:   06/04/14 1544  BP: 192/81  Pulse: 59  Temp: 98.5 F (36.9 C)  Resp: 48    Intake/Output Summary (Last 24 hours) at 06/04/14 1805 Last data filed at 06/04/14 1545  Gross per 24 hour  Intake 4041.66 ml  Output   2500 ml  Net 1541.66 ml   Filed Weights   06/01/14 2225 06/02/14 0500  Weight: 77.111 kg (170 lb) 79 kg (174 lb 2.6 oz)    Exam:   General:  NAD  Cardiovascular: s1, s2, rrr  Respiratory: cta b  Abdomen: soft, distended, diffusely tender, bs+  Musculoskeletal: no edema b/l   Data  Reviewed: Basic Metabolic Panel:  Recent Labs Lab 06/02/14 0030 06/02/14 0604 06/03/14 0551 06/04/14 0545  NA 139 139 142 139  K 4.3 4.1 3.5 3.7  CL 104 107 112 110  CO2 26 23 24 25   GLUCOSE 208* 160* 86 119*  BUN 21 17 12 9   CREATININE 1.06 0.91 1.01 0.94  CALCIUM 9.6 8.3* 8.0* 8.2*   Liver Function Tests:  Recent Labs Lab 06/02/14 0030 06/02/14 0604 06/03/14 0551 06/04/14 0545  AST 24 24 17 19   ALT 19 18 15 16   ALKPHOS 111 87 75 79  BILITOT 0.5 0.4 0.5 0.4  PROT 7.9 6.7 6.2 6.0  ALBUMIN 4.2 3.3* 3.2* 3.1*    Recent Labs Lab 06/02/14 0030  LIPASE 23   No results for input(s): AMMONIA in the last 168 hours. CBC:  Recent Labs Lab 06/02/14 0030 06/02/14 0604 06/03/14 0551 06/04/14 0545  WBC 10.1 9.4 5.1 5.7  NEUTROABS 8.8* 7.0 2.4 2.4  HGB 12.0 10.1* 8.9* 8.9*  HCT 39.4 33.5* 30.3* 29.8*  MCV 73.9* 74.0* 76.1* 76.0*  PLT 277 270 204 190   Cardiac Enzymes:  Recent Labs Lab 06/02/14 0030  TROPONINI <0.03   BNP (last 3 results) No results for input(s): BNP in the last 8760 hours.  ProBNP (last 3 results) No results for input(s): PROBNP in the last 8760 hours.  CBG:  Recent Labs Lab 06/04/14 0027 06/04/14 0410 06/04/14 0747 06/04/14 1159 06/04/14 1619  GLUCAP 116* 120* 135* 111* 121*    Recent Results (from the past 240 hour(s))  MRSA PCR Screening     Status: None   Collection Time: 06/02/14  11:25 AM  Result Value Ref Range Status   MRSA by PCR NEGATIVE NEGATIVE Final    Comment:        The GeneXpert MRSA Assay (FDA approved for NASAL specimens only), is one component of a comprehensive MRSA colonization surveillance program. It is not intended to diagnose MRSA infection nor to guide or monitor treatment for MRSA infections.      Studies: No results found.  Scheduled Meds: . aspirin EC  81 mg Oral Daily  . ciprofloxacin  400 mg Intravenous Q12H  . cloNIDine  0.1 mg Oral TID  . heparin  5,000 Units Subcutaneous 3 times  per day  . hydroxychloroquine  200 mg Oral Daily  . insulin aspart  0-9 Units Subcutaneous 6 times per day  . insulin glargine  40 Units Subcutaneous QHS  . lamoTRIgine  150 mg Oral q morning - 10a  . levothyroxine  50 mcg Oral BH-q7a  . metoprolol  50 mg Oral BID  . mometasone-formoterol  2 puff Inhalation BID  . ondansetron (ZOFRAN) IV  4 mg Intravenous TID AC  . pantoprazole  40 mg Oral BID  . polyethylene glycol  17 g Oral Q1 Hr x 3  . pregabalin  75 mg Oral BID  . sertraline  100 mg Oral q morning - 10a  . thioridazine  20 mg Oral QHS  . traZODone  100 mg Oral QHS   Continuous Infusions: . sodium chloride 75 mL/hr at 06/04/14 1539  . dextrose 5 % and 0.45% NaCl 100 mL/hr at 06/04/14 1610    Active Problems:   Enteritis   Generalized abdominal pain   Nausea with vomiting    Time spent: 43mins    [name]  [facility] Pager [phone]. If 7PM-7AM, please contact night-coverage at [email or web address], password [facility] 06/04/2014, 6:05 PM  LOS: 2 days

## 2014-06-04 NOTE — Progress Notes (Signed)
    Subjective: States she's feeling about the same today, continued nausea and abdominal pain. Denies any vomiting since last night. States no bowl movement since Monday. Has received milk and molasses enema. Notes baseline symptoms of nausea, early satiety, straining for bowel movements and will occasionally go 4-5 days without a bowel movement. States she does not have constipation. Has been tried on Amitiza, Linzess. Consumes adequate water, increased fiber intake. Has tried prune juice and Miralax all without good result. Is diabetic.Per the patient she was told by her PCP that she has IBS although this is not listed in her PMH.  Objective: Vital signs in last 24 hours: Temp:  [98.2 F (36.8 C)-98.4 F (36.9 C)] 98.2 F (36.8 C) (03/24 0413) Pulse Rate:  [51-56] 51 (03/24 0413) Resp:  [14-20] 14 (03/24 0413) BP: (111-113)/(56) 111/56 mmHg (03/24 0413) SpO2:  [95 %-100 %] 99 % (03/24 0707) Last BM Date: 06/03/14 General:   Alert and oriented, pleasant. Slow speech. Head:  Normocephalic and atraumatic. Heart:  S1, S2 present, no murmurs noted.  Lungs: Clear to auscultation bilaterally, without wheezing, rales, or rhonchi.  Abdomen:  Bowel sounds present, soft, non-distended. Moderate TTP generalized abdomen. No HSM or hernias noted. No rebound or guarding. No masses appreciated  Pulses:  Normal pulses noted. Extremities:  Without clubbing or edema. Neurologic:  Alert and  oriented x4;  grossly normal neurologically. Skin:  Warm and dry, intact without significant lesions.  Psych:  Alert and cooperative. Normal mood and affect.  Intake/Output from previous day: 03/23 0701 - 03/24 0700 In: 4041.7 [P.O.:720; I.V.:2621.7; IV Piggyback:700] Out: 2300 [Urine:2300] Intake/Output this shift: Total I/O In: 780 [P.O.:480; IV Piggyback:300] Out: 1200 [Urine:1200]  Lab Results:  Recent Labs  06/02/14 0604 06/03/14 0551 06/04/14 0545  WBC 9.4 5.1 5.7  HGB 10.1* 8.9* 8.9*  HCT 33.5*  30.3* 29.8*  PLT 270 204 190   BMET  Recent Labs  06/02/14 0604 06/03/14 0551 06/04/14 0545  NA 139 142 139  K 4.1 3.5 3.7  CL 107 112 110  CO2 23 24 25   GLUCOSE 160* 86 119*  BUN 17 12 9   CREATININE 0.91 1.01 0.94  CALCIUM 8.3* 8.0* 8.2*   LFT  Recent Labs  06/02/14 0604 06/03/14 0551 06/04/14 0545  PROT 6.7 6.2 6.0  ALBUMIN 3.3* 3.2* 3.1*  AST 24 17 19   ALT 18 15 16   ALKPHOS 87 75 79  BILITOT 0.4 0.5 0.4   PT/INR No results for input(s): LABPROT, INR in the last 72 hours. Hepatitis Panel No results for input(s): HEPBSAG, HCVAB, HEPAIGM, HEPBIGM in the last 72 hours.   Studies/Results: No results found.  Assessment: 64 year old female with underlying DM, nausea, early sateity presents with worsening N/V and abdominal pain. CT indicative of enteritis, no acute findings. Continues with nausea and no bowel movement in 4-5 days. Abdomen remains quite tender but without peritoneal signs. Early satiety in the setting of DM possible gastroparesis as a component of her symptoms.   Plan: 1. Miralax q hour for 3 hours 2. Make antiemetics scheduled 3. Continue supportive measures 4. Will likely need outpatient GI follow-up post-discharge to workup constipation and early satiety in the setting of DM, possible gastroparesis. 5. Discontinue Flagyl as may be worsening nausea.    Walden Field, AGNP-C Adult & Gerontological Nurse Practitioner [facility]    LOS: 2 days    06/04/2014, 3:30 PM

## 2014-06-05 ENCOUNTER — Telehealth: Payer: Self-pay | Admitting: Gastroenterology

## 2014-06-05 ENCOUNTER — Ambulatory Visit: Payer: Commercial Managed Care - HMO

## 2014-06-05 DIAGNOSIS — D6489 Other specified anemias: Secondary | ICD-10-CM

## 2014-06-05 DIAGNOSIS — E1065 Type 1 diabetes mellitus with hyperglycemia: Secondary | ICD-10-CM

## 2014-06-05 LAB — GLUCOSE, CAPILLARY
GLUCOSE-CAPILLARY: 108 mg/dL — AB (ref 70–99)
GLUCOSE-CAPILLARY: 126 mg/dL — AB (ref 70–99)
Glucose-Capillary: 101 mg/dL — ABNORMAL HIGH (ref 70–99)
Glucose-Capillary: 102 mg/dL — ABNORMAL HIGH (ref 70–99)
Glucose-Capillary: 90 mg/dL (ref 70–99)

## 2014-06-05 LAB — CBC WITH DIFFERENTIAL/PLATELET
BASOS PCT: 0 % (ref 0–1)
Basophils Absolute: 0 10*3/uL (ref 0.0–0.1)
EOS ABS: 0.3 10*3/uL (ref 0.0–0.7)
Eosinophils Relative: 6 % — ABNORMAL HIGH (ref 0–5)
HEMATOCRIT: 29.2 % — AB (ref 36.0–46.0)
HEMOGLOBIN: 8.7 g/dL — AB (ref 12.0–15.0)
Lymphocytes Relative: 37 % (ref 12–46)
Lymphs Abs: 1.9 10*3/uL (ref 0.7–4.0)
MCH: 22.6 pg — AB (ref 26.0–34.0)
MCHC: 29.8 g/dL — AB (ref 30.0–36.0)
MCV: 75.8 fL — AB (ref 78.0–100.0)
Monocytes Absolute: 0.5 10*3/uL (ref 0.1–1.0)
Monocytes Relative: 9 % (ref 3–12)
Neutro Abs: 2.5 10*3/uL (ref 1.7–7.7)
Neutrophils Relative %: 49 % (ref 43–77)
PLATELETS: 170 10*3/uL (ref 150–400)
RBC: 3.85 MIL/uL — ABNORMAL LOW (ref 3.87–5.11)
RDW: 14.5 % (ref 11.5–15.5)
WBC: 5.2 10*3/uL (ref 4.0–10.5)

## 2014-06-05 LAB — COMPREHENSIVE METABOLIC PANEL
ALK PHOS: 79 U/L (ref 39–117)
ALT: 18 U/L (ref 0–35)
AST: 24 U/L (ref 0–37)
Albumin: 3.1 g/dL — ABNORMAL LOW (ref 3.5–5.2)
Anion gap: 5 (ref 5–15)
BUN: 7 mg/dL (ref 6–23)
CO2: 25 mmol/L (ref 19–32)
Calcium: 8.3 mg/dL — ABNORMAL LOW (ref 8.4–10.5)
Chloride: 110 mmol/L (ref 96–112)
Creatinine, Ser: 0.98 mg/dL (ref 0.50–1.10)
GFR calc non Af Amer: 60 mL/min — ABNORMAL LOW (ref >90)
GFR, EST AFRICAN AMERICAN: 70 mL/min — AB (ref >90)
Glucose, Bld: 131 mg/dL — ABNORMAL HIGH (ref 70–99)
POTASSIUM: 3.7 mmol/L (ref 3.5–5.1)
SODIUM: 140 mmol/L (ref 135–145)
Total Bilirubin: 0.4 mg/dL (ref 0.3–1.2)
Total Protein: 6 g/dL (ref 6.0–8.3)

## 2014-06-05 LAB — IRON AND TIBC
Iron: 52 ug/dL (ref 42–145)
Saturation Ratios: 17 % — ABNORMAL LOW (ref 20–55)
TIBC: 301 ug/dL (ref 250–470)
UIBC: 249 ug/dL (ref 125–400)

## 2014-06-05 LAB — CLOSTRIDIUM DIFFICILE BY PCR: Toxigenic C. Difficile by PCR: NEGATIVE

## 2014-06-05 LAB — FERRITIN: FERRITIN: 48 ng/mL (ref 10–291)

## 2014-06-05 MED ORDER — DOCUSATE SODIUM 100 MG PO CAPS
200.0000 mg | ORAL_CAPSULE | Freq: Every day | ORAL | Status: DC
  Administered 2014-06-05 – 2014-06-06 (×2): 200 mg via ORAL
  Filled 2014-06-05 (×2): qty 2

## 2014-06-05 MED ORDER — CIPROFLOXACIN IN D5W 400 MG/200ML IV SOLN
400.0000 mg | Freq: Two times a day (BID) | INTRAVENOUS | Status: AC
  Administered 2014-06-05: 400 mg via INTRAVENOUS
  Filled 2014-06-05: qty 200

## 2014-06-05 MED ORDER — DICYCLOMINE HCL 10 MG PO CAPS
10.0000 mg | ORAL_CAPSULE | Freq: Two times a day (BID) | ORAL | Status: DC
  Administered 2014-06-05 – 2014-06-07 (×4): 10 mg via ORAL
  Filled 2014-06-05 (×4): qty 1

## 2014-06-05 MED ORDER — PANTOPRAZOLE SODIUM 40 MG PO TBEC
40.0000 mg | DELAYED_RELEASE_TABLET | Freq: Two times a day (BID) | ORAL | Status: DC
  Administered 2014-06-05 – 2014-06-07 (×4): 40 mg via ORAL
  Filled 2014-06-05 (×4): qty 1

## 2014-06-05 MED ORDER — RISAQUAD PO CAPS
2.0000 | ORAL_CAPSULE | Freq: Every day | ORAL | Status: DC
  Administered 2014-06-05 – 2014-06-07 (×3): 2 via ORAL
  Filled 2014-06-05 (×3): qty 2

## 2014-06-05 NOTE — Progress Notes (Addendum)
Patient ID: [name], female   DOB: [birth year], 64 y.o.   MRN: [id number]  Assessment/Plan: ADMITTED WITH NAUSEA, VOMITING, AND ABDOMINAL PAIN MOST LIKELY DUE TO GASTROPARESIS, GERD, AND IBS. SYMPTOMS NOT IDEALLY CONTROLLED. HAD DIARRHEA AND NOW WITH DIFFICULTY PASSING STOOL. ON DAY 3 OF CIPRO.  PLAN: 1. ADVANCE DIET MAR 26. D/C TO HOME MAR 26 IF PT TOLERATES POs. 2. ZOFRAN QAC AND HS. BID PPI. 3. GES AS AN OUTPATIENT. 4. ADD LOW DOSE BENTYL-DISCUSSED SIDE EFFECTS OF CONSTIPATION WITH PT. ADD COLACE 200 MG QHS. 5. ADD A PROBIOTIC DAILY. 6. OPV IN 4 WEEKS WITH DR. Gala Romney. CONISDER TCS AS OUTPT FOR CHANGE IN BOWEL HABITS/MICROCYTIC ANEMIA. 7. AWAIT FERRITIN. PT WILL NEED CAPSULE ENDOSCOPY FOR ANEMIA/ABDOMINAL PAIN. 8. D/C CIPRO AFTER LAST DOSE TODAY.   Subjective: Since I last evaluated the patient SHE HAS NOT VOMITED. SHE C/O NAUSEA. TOLERATING POs. "FORCED" HERSELF TO EAT CREAM OF POTATO SOUP. GETS FULL FAST. HAS WATERY STOOL WITH MIRALAX. IS HAVING TROUBLE PASSING STOOL. RECORDS REVIEWED FROM 2014 TO PRESENT. LAST TCS JUL 2014: HEME POS STOOLS & PT HAVING CONSTIPATION-2 MM SIMPLE ADENOMA REMOVED.   Objective: Vital signs in last 24 hours: Filed Vitals:   06/05/14 0927  BP: 160/66  Pulse: 55  Temp:   Resp:    General appearance: alert, cooperative and no distress Resp: clear to auscultation bilaterally Cardio: regular rate and rhythm GI: soft, MILDLY tender IN ALL 4 QUADRANTS; bowel sounds normal;   Lab Results:  Cr 0.98, Hb 8.7 MCV 75.8 PLT 170 ALB 3.1   Studies/Results: CT ABD: MAR 22-ENTERITIS  Medications: I have reviewed the patient's current medications.   LOS: 5 days   Barney Drain 08/21/2013, 2:23 PM

## 2014-06-05 NOTE — Evaluation (Signed)
Physical Therapy Evaluation Patient Details Name: [name] MRN: [id number] DOB: [birth year] Today's Date: 06/05/2014   History of Present Illness  History of Present Illness:This is a 64 y.o. year old female with significant past medical history of multiple medical problems including IDDM, arthritis, bipolar disorder, COPD, diverticulosis, hx/o CVA presenting with enteritis. Pt reports nausea, emesis, generalized/epigastric abd pain over last 1-2 days. No fevers or chills. No known sick contacts. Presented to ER because of inability to tolerate PO. Reports hx/o colonoscopy w/ polyp removal in the past.   Clinical Impression  Pt is seen for evaluation.  She is chronically ill and very sedentary at home.  She has mild left hemiparesis for old stroke which has significantly limited her mobility at home.  Pt is generally weak and deconditioned with left extremeties slightly weaker than right.  She is independent in transfers and was able to ambulate 30' with a walker, good stability.  She is probably close to her functional baseline and would benefit from continuing Hoberg.    Follow Up Recommendations Home health PT    Equipment Recommendations  None recommended by PT    Recommendations for Other Services   none    Precautions / Restrictions Precautions Precautions: Fall Restrictions Weight Bearing Restrictions: No      Mobility  Bed Mobility Overal bed mobility: Modified Independent                Transfers Overall transfer level: Modified independent                  Ambulation/Gait Ambulation/Gait assistance: Modified independent (Device/Increase time) Ambulation Distance (Feet): 30 Feet Assistive device: Rolling walker (2 wheeled) Gait Pattern/deviations: Trunk flexed;Shuffle   Gait velocity interpretation: Below normal speed for age/gender General Gait Details: unable to pick either foot up off of the floor...basically slides each forward to take a  step  Stairs            Wheelchair Mobility    Modified Rankin (Stroke Patients Only)       Balance Overall balance assessment: No apparent balance deficits (not formally assessed)                                           Pertinent Vitals/Pain Pain Assessment: No/denies pain    Home Living Family/patient expects to be discharged to:: Private residence Living Arrangements: Alone Available Help at Discharge: Home health;Available PRN/intermittently Type of Home: Apartment Home Access: Level entry     Home Layout: One level Home Equipment: Walker - 2 wheels;Cane - single point;Bedside commode;Wheelchair - Brewing technologist      Prior Function Level of Independence: Needs assistance   Gait / Transfers Assistance Needed: transfers independently, ambulates short distances with walker or cane independently  ADL's / Homemaking Assistance Needed: full assist with household activities:  she is able to give herself a sponge bath and dress herself        Hand Dominance   Dominant Hand: Right    Extremity/Trunk Assessment               Lower Extremity Assessment: Generalized weakness (LLE slightly weaker than RLE)      Cervical / Trunk Assessment: Kyphotic  Communication   Communication: No difficulties  Cognition Arousal/Alertness: Awake/alert Behavior During Therapy: WFL for tasks assessed/performed Overall Cognitive Status: Within Functional Limits for tasks assessed  General Comments      Exercises        Assessment/Plan    PT Assessment All further PT needs can be met in the next venue of care  PT Diagnosis Difficulty walking;Generalized weakness   PT Problem List Decreased strength;Decreased activity tolerance;Decreased mobility;Obesity;Impaired sensation  PT Treatment Interventions     PT Goals (Current goals can be found in the Care Plan section) Acute Rehab PT Goals PT Goal Formulation:  All assessment and education complete, DC therapy    Frequency     Barriers to discharge  none      Co-evaluation               End of Session Equipment Utilized During Treatment: Gait belt Activity Tolerance: Patient tolerated treatment well Patient left: in bed;with call bell/phone within reach;with bed alarm set           Time: 1779-3903 PT Time Calculation (min) (ACUTE ONLY): 28 min   Charges:   PT Evaluation $Initial PT Evaluation Tier I: 1 Procedure     PT G CodesDemetrios Isaacs L 06/05/2014, 3:12 PM

## 2014-06-05 NOTE — Care Management Utilization Note (Signed)
UR completed 

## 2014-06-05 NOTE — Progress Notes (Signed)
Patient appetite poor at breakfast. Encourage patient to eat.

## 2014-06-05 NOTE — Progress Notes (Signed)
[facility] PROGRESS NOTE  [name] BJS:[id number] DOB: [birth year] DOA: 06/01/2014 PCP: Tula Nakayama, MD  Assessment/Plan: 1. Enteritis. Possibly infectious source. On ciprofloxacin which will be discontinued after today's dose. Zofran has been scheduled every 6 hours. We'll continue to try to advance diet as tolerated. Low dose Bentyl added to regimen today. She is not having any diarrhea. 2. Nausea and vomiting. Felt to be related to gastroparesis, GERD and IBS. Plans are for further outpatient work up with gastric emptying study  3. Anemia, suspect iron deficiency. Iron studies have been ordered. Continue to follow. She does not have any evidence of bleeding. Will need outpatient capsule study with GI. 4. Diabetes. Blood sugars now stable. Continue to monitor blood sugars 5. Hypertension. Stable. Continue outpatient regimen 6. Bipolar disorder. Stable. 7. Seizure disorder. Stable  Code Status: full code Family Communication: discussed with patient Disposition Plan: discharge home once improved   Consultants:  Gastroenterology  Procedures:    Antibiotics:  cipro 3/22>>  Flagyl 3/22>> 3/24  HPI/Subjective: Continues to have nausea, no vomiting. Forced herself to have potato soup today. Had bowel movement with miralax yesterday. Continues to have abdominal pain.  Objective: Filed Vitals:   06/05/14 1312  BP: 161/54  Pulse: 56  Temp: 98.8 F (37.1 C)  Resp: 19    Intake/Output Summary (Last 24 hours) at 06/05/14 1551 Last data filed at 06/05/14 1332  Gross per 24 hour  Intake    920 ml  Output   1301 ml  Net   -381 ml   Filed Weights   06/01/14 2225 06/02/14 0500  Weight: 77.111 kg (170 lb) 79 kg (174 lb 2.6 oz)    Exam:   General:  NAD  Cardiovascular: s1, s2, rrr  Respiratory: cta b  Abdomen: soft, distended, diffusely tender, bs+  Musculoskeletal: no edema b/l   Data Reviewed: Basic Metabolic Panel:  Recent Labs Lab  06/02/14 0030 06/02/14 0604 06/03/14 0551 06/04/14 0545 06/05/14 0607  NA 139 139 142 139 140  K 4.3 4.1 3.5 3.7 3.7  CL 104 107 112 110 110  CO2 26 23 24 25 25   GLUCOSE 208* 160* 86 119* 131*  BUN 21 17 12 9 7   CREATININE 1.06 0.91 1.01 0.94 0.98  CALCIUM 9.6 8.3* 8.0* 8.2* 8.3*   Liver Function Tests:  Recent Labs Lab 06/02/14 0030 06/02/14 0604 06/03/14 0551 06/04/14 0545 06/05/14 0607  AST 24 24 17 19 24   ALT 19 18 15 16 18   ALKPHOS 111 87 75 79 79  BILITOT 0.5 0.4 0.5 0.4 0.4  PROT 7.9 6.7 6.2 6.0 6.0  ALBUMIN 4.2 3.3* 3.2* 3.1* 3.1*    Recent Labs Lab 06/02/14 0030  LIPASE 23   No results for input(s): AMMONIA in the last 168 hours. CBC:  Recent Labs Lab 06/02/14 0030 06/02/14 0604 06/03/14 0551 06/04/14 0545 06/05/14 0607  WBC 10.1 9.4 5.1 5.7 5.2  NEUTROABS 8.8* 7.0 2.4 2.4 2.5  HGB 12.0 10.1* 8.9* 8.9* 8.7*  HCT 39.4 33.5* 30.3* 29.8* 29.2*  MCV 73.9* 74.0* 76.1* 76.0* 75.8*  PLT 277 270 204 190 170   Cardiac Enzymes:  Recent Labs Lab 06/02/14 0030  TROPONINI <0.03   BNP (last 3 results) No results for input(s): BNP in the last 8760 hours.  ProBNP (last 3 results) No results for input(s): PROBNP in the last 8760 hours.  CBG:  Recent Labs Lab 06/04/14 2101 06/05/14 0019 06/05/14 0421 06/05/14 0755 06/05/14 1158  GLUCAP 131* 90 102*  101* 108*    Recent Results (from the past 240 hour(s))  MRSA PCR Screening     Status: None   Collection Time: 06/02/14 11:25 AM  Result Value Ref Range Status   MRSA by PCR NEGATIVE NEGATIVE Final    Comment:        The GeneXpert MRSA Assay (FDA approved for NASAL specimens only), is one component of a comprehensive MRSA colonization surveillance program. It is not intended to diagnose MRSA infection nor to guide or monitor treatment for MRSA infections.   Clostridium Difficile by PCR     Status: None   Collection Time: 06/04/14 10:43 PM  Result Value Ref Range Status   C difficile  by pcr NEGATIVE NEGATIVE Final     Studies: No results found.  Scheduled Meds: . acidophilus  2 capsule Oral Daily  . aspirin EC  81 mg Oral Daily  . ciprofloxacin  400 mg Intravenous Q12H  . cloNIDine  0.1 mg Oral TID  . dicyclomine  10 mg Oral BID AC  . docusate sodium  200 mg Oral QHS  . heparin  5,000 Units Subcutaneous 3 times per day  . hydroxychloroquine  200 mg Oral Daily  . insulin aspart  0-9 Units Subcutaneous 6 times per day  . insulin glargine  30 Units Subcutaneous QHS  . lamoTRIgine  150 mg Oral q morning - 10a  . levothyroxine  50 mcg Oral BH-q7a  . metoprolol  50 mg Oral BID  . mometasone-formoterol  2 puff Inhalation BID  . ondansetron (ZOFRAN) IV  4 mg Intravenous TID AC  . pantoprazole  40 mg Oral BID AC  . pregabalin  75 mg Oral BID  . sertraline  100 mg Oral q morning - 10a  . thioridazine  20 mg Oral QHS  . traZODone  100 mg Oral QHS   Continuous Infusions:    Active Problems:   Essential hypertension   Bipolar disorder   Diabetes mellitus, insulin dependent (IDDM), uncontrolled   Anemia   Seizure disorder   Enteritis   Generalized abdominal pain   Nausea with vomiting    Time spent: 71mins    Tanyon Alipio  [facility] Pager [phone]. If 7PM-7AM, please contact night-coverage at [email or web address], password [facility] 06/05/2014, 3:51 PM  LOS: 3 days

## 2014-06-05 NOTE — Telephone Encounter (Signed)
ARRANGE FOR:  1. GES AS AN OUTPATIENT, DX: NAUSEA/VOMITING WITHIN 7 DAYS. 2. OPV IN 4 WEEKS WITH DR. Gala Romney. CONISDER TCS AS OUTPT FOR CHANGE IN BOWEL HABITS/MICROCYTIC ANEMIA.

## 2014-06-06 DIAGNOSIS — K5900 Constipation, unspecified: Secondary | ICD-10-CM

## 2014-06-06 DIAGNOSIS — K589 Irritable bowel syndrome without diarrhea: Secondary | ICD-10-CM

## 2014-06-06 LAB — GLUCOSE, CAPILLARY
GLUCOSE-CAPILLARY: 95 mg/dL (ref 70–99)
GLUCOSE-CAPILLARY: 106 mg/dL — AB (ref 70–99)
GLUCOSE-CAPILLARY: 130 mg/dL — AB (ref 70–99)
Glucose-Capillary: 119 mg/dL — ABNORMAL HIGH (ref 70–99)
Glucose-Capillary: 182 mg/dL — ABNORMAL HIGH (ref 70–99)
Glucose-Capillary: 100 mg/dL — ABNORMAL HIGH (ref 70–99)
Glucose-Capillary: 164 mg/dL — ABNORMAL HIGH (ref 70–99)

## 2014-06-06 LAB — CBC WITH DIFFERENTIAL/PLATELET
BASOS PCT: 0 % (ref 0–1)
Basophils Absolute: 0 10*3/uL (ref 0.0–0.1)
Eosinophils Absolute: 0.2 10*3/uL (ref 0.0–0.7)
Eosinophils Relative: 4 % (ref 0–5)
HEMATOCRIT: 29.7 % — AB (ref 36.0–46.0)
HEMOGLOBIN: 8.7 g/dL — AB (ref 12.0–15.0)
Lymphocytes Relative: 37 % (ref 12–46)
Lymphs Abs: 2 10*3/uL (ref 0.7–4.0)
MCH: 22.2 pg — ABNORMAL LOW (ref 26.0–34.0)
MCHC: 29.3 g/dL — ABNORMAL LOW (ref 30.0–36.0)
MCV: 75.8 fL — AB (ref 78.0–100.0)
MONO ABS: 0.5 10*3/uL (ref 0.1–1.0)
MONOS PCT: 9 % (ref 3–12)
NEUTROS ABS: 2.7 10*3/uL (ref 1.7–7.7)
NEUTROS PCT: 50 % (ref 43–77)
Platelets: 181 10*3/uL (ref 150–400)
RBC: 3.92 MIL/uL (ref 3.87–5.11)
RDW: 14.6 % (ref 11.5–15.5)
WBC: 5.4 10*3/uL (ref 4.0–10.5)

## 2014-06-06 LAB — COMPREHENSIVE METABOLIC PANEL
ALT: 20 U/L (ref 0–35)
AST: 26 U/L (ref 0–37)
Albumin: 3 g/dL — ABNORMAL LOW (ref 3.5–5.2)
Alkaline Phosphatase: 75 U/L (ref 39–117)
Anion gap: 5 (ref 5–15)
BUN: 7 mg/dL (ref 6–23)
CALCIUM: 8.4 mg/dL (ref 8.4–10.5)
CHLORIDE: 109 mmol/L (ref 96–112)
CO2: 25 mmol/L (ref 19–32)
Creatinine, Ser: 1.17 mg/dL — ABNORMAL HIGH (ref 0.50–1.10)
GFR calc Af Amer: 56 mL/min — ABNORMAL LOW (ref >90)
GFR calc non Af Amer: 49 mL/min — ABNORMAL LOW (ref >90)
Glucose, Bld: 152 mg/dL — ABNORMAL HIGH (ref 70–99)
Potassium: 3.5 mmol/L (ref 3.5–5.1)
SODIUM: 139 mmol/L (ref 135–145)
TOTAL PROTEIN: 5.8 g/dL — AB (ref 6.0–8.3)
Total Bilirubin: 0.4 mg/dL (ref 0.3–1.2)

## 2014-06-06 NOTE — Progress Notes (Signed)
  Subjective:  Patient reports having less abdominal pain. She points to her mid abdomen. She continues to complain of being constipated. She had 3 bowel movements other day after she took polyethylene glycol. She says appetite is not good. She denies nausea or vomiting.  Objective: Blood pressure 147/64, pulse 62, temperature 98.7 F (37.1 C), temperature source Oral, resp. rate 20, height 4\' 11"  (1.499 m), weight 174 lb 2.6 oz (79 kg), SpO2 94 %. Patient appears to become stable. Abdomen is full with normal bowel sounds. Abdomen is soft with mild generalized tenderness. No organomegaly or masses. No LE edema or clubbing noted.  Labs/studies Results:   Recent Labs  06/04/14 0545 06/05/14 0607 06/06/14 0612  WBC 5.7 5.2 5.4  HGB 8.9* 8.7* 8.7*  HCT 29.8* 29.2* 29.7*  PLT 190 170 181    BMET   Recent Labs  06/04/14 0545 06/05/14 0607 06/06/14 0612  NA 139 140 139  K 3.7 3.7 3.5  CL 110 110 109  CO2 25 25 25   GLUCOSE 119* 131* 152*  BUN 9 7 7   CREATININE 0.94 0.98 1.17*  CALCIUM 8.2* 8.3* 8.4    LFT   Recent Labs  06/04/14 0545 06/05/14 0607 06/06/14 0612  PROT 6.0 6.0 5.8*  ALBUMIN 3.1* 3.1* 3.0*  AST 19 24 26   ALT 16 18 20   ALKPHOS 79 79 75  BILITOT 0.4 0.4 0.4     Assessment:  #1. Acute on chronic abdominal pain possibly due to enteritis treated empirically with antibiotics. She has residual abdominal pain some of which may be due to IBS and/or gastroparesis. #2. Iron deficiency anemia. Patient will undergo outpatient colonoscopy as recommended by Dr. Oneida Alar. #3. Chronic constipation predominant is secondary to her medications but she also has IBS.   Recommendations;  Will schedule outpatient colonoscopy with Dr. Gala Romney.

## 2014-06-06 NOTE — Progress Notes (Signed)
[facility] PROGRESS NOTE  [name] UDJ:[id number] DOB: Jul 05, 1950 DOA: 06/01/2014 PCP: Tula Nakayama, MD  Assessment/Plan: 1. Abdominal pain. She was treated with intravenous antibiotics for enteritis, but has not had any diarrhea. She does have an element of IBS which is likely contributing to pain. Plans are for outpatient colonoscopy. Continue pain medications. 2. Nausea and vomiting. Felt to be related to gastroparesis, GERD and IBS. Plans are for further outpatient work up with gastric emptying study. Continue to advance diet as tolerated  3. Anemia, suspect iron deficiency. Iron studies have been ordered. Continue to follow. She does not have any evidence of bleeding. Will need outpatient capsule study with GI. 4. Diabetes. Blood sugars now stable. Continue to monitor blood sugars 5. Hypertension. Stable. Continue outpatient regimen 6. Bipolar disorder. Stable. 7. Seizure disorder. Stable  Code Status: full code Family Communication: discussed with patient Disposition Plan: discharge home once improved   Consultants:  Gastroenterology  Procedures:    Antibiotics:  cipro 3/22>>3/25  Flagyl 3/22>> 3/24  HPI/Subjective: Feels pain is only mildly improved. Having difficulty consuming foods. Feels she is about to vomit every time she tries to eat anything.  Objective: Filed Vitals:   06/06/14 1505  BP: 187/76  Pulse:   Temp:   Resp:     Intake/Output Summary (Last 24 hours) at 06/06/14 1902 Last data filed at 06/06/14 1700  Gross per 24 hour  Intake    240 ml  Output      0 ml  Net    240 ml   Filed Weights   06/01/14 2225 06/02/14 0500  Weight: 77.111 kg (170 lb) 79 kg (174 lb 2.6 oz)    Exam:   General:  NAD  Cardiovascular: s1, s2, rrr  Respiratory: cta b  Abdomen: soft, distended, diffusely tender, bs+  Musculoskeletal: no edema b/l   Data Reviewed: Basic Metabolic Panel:  Recent Labs Lab 06/02/14 0604 06/03/14 0551  06/04/14 0545 06/05/14 0607 06/06/14 0612  NA 139 142 139 140 139  K 4.1 3.5 3.7 3.7 3.5  CL 107 112 110 110 109  CO2 23 24 25 25 25   GLUCOSE 160* 86 119* 131* 152*  BUN 17 12 9 7 7   CREATININE 0.91 1.01 0.94 0.98 1.17*  CALCIUM 8.3* 8.0* 8.2* 8.3* 8.4   Liver Function Tests:  Recent Labs Lab 06/02/14 0604 06/03/14 0551 06/04/14 0545 06/05/14 0607 06/06/14 0612  AST 24 17 19 24 26   ALT 18 15 16 18 20   ALKPHOS 87 75 79 79 75  BILITOT 0.4 0.5 0.4 0.4 0.4  PROT 6.7 6.2 6.0 6.0 5.8*  ALBUMIN 3.3* 3.2* 3.1* 3.1* 3.0*    Recent Labs Lab 06/02/14 0030  LIPASE 23   No results for input(s): AMMONIA in the last 168 hours. CBC:  Recent Labs Lab 06/02/14 0604 06/03/14 0551 06/04/14 0545 06/05/14 0607 06/06/14 0612  WBC 9.4 5.1 5.7 5.2 5.4  NEUTROABS 7.0 2.4 2.4 2.5 2.7  HGB 10.1* 8.9* 8.9* 8.7* 8.7*  HCT 33.5* 30.3* 29.8* 29.2* 29.7*  MCV 74.0* 76.1* 76.0* 75.8* 75.8*  PLT 270 204 190 170 181   Cardiac Enzymes:  Recent Labs Lab 06/02/14 0030  TROPONINI <0.03   BNP (last 3 results) No results for input(s): BNP in the last 8760 hours.  ProBNP (last 3 results) No results for input(s): PROBNP in the last 8760 hours.  CBG:  Recent Labs Lab 06/06/14 0103 06/06/14 0459 06/06/14 0715 06/06/14 1133 06/06/14 1614  GLUCAP 119* 106*  182* 100* 130*    Recent Results (from the past 240 hour(s))  MRSA PCR Screening     Status: None   Collection Time: 06/02/14 11:25 AM  Result Value Ref Range Status   MRSA by PCR NEGATIVE NEGATIVE Final    Comment:        The GeneXpert MRSA Assay (FDA approved for NASAL specimens only), is one component of a comprehensive MRSA colonization surveillance program. It is not intended to diagnose MRSA infection nor to guide or monitor treatment for MRSA infections.   Clostridium Difficile by PCR     Status: None   Collection Time: 06/04/14 10:43 PM  Result Value Ref Range Status   C difficile by pcr NEGATIVE NEGATIVE  Final     Studies: No results found.  Scheduled Meds: . acidophilus  2 capsule Oral Daily  . aspirin EC  81 mg Oral Daily  . cloNIDine  0.1 mg Oral TID  . dicyclomine  10 mg Oral BID AC  . docusate sodium  200 mg Oral QHS  . heparin  5,000 Units Subcutaneous 3 times per day  . hydroxychloroquine  200 mg Oral Daily  . insulin aspart  0-9 Units Subcutaneous 6 times per day  . insulin glargine  30 Units Subcutaneous QHS  . lamoTRIgine  150 mg Oral q morning - 10a  . levothyroxine  50 mcg Oral BH-q7a  . metoprolol  50 mg Oral BID  . mometasone-formoterol  2 puff Inhalation BID  . ondansetron (ZOFRAN) IV  4 mg Intravenous TID AC  . pantoprazole  40 mg Oral BID AC  . pregabalin  75 mg Oral BID  . sertraline  100 mg Oral q morning - 10a  . thioridazine  20 mg Oral QHS  . traZODone  100 mg Oral QHS   Continuous Infusions:    Active Problems:   Essential hypertension   Bipolar disorder   Diabetes mellitus, insulin dependent (IDDM), uncontrolled   Anemia   Seizure disorder   Enteritis   Generalized abdominal pain   Nausea with vomiting    Time spent: 13mins    Denman Pichardo  [facility] Pager [phone]. If 7PM-7AM, please contact night-coverage at [email or web address], password [facility] 06/06/2014, 7:02 PM  LOS: 4 days

## 2014-06-07 LAB — CBC WITH DIFFERENTIAL/PLATELET
Basophils Absolute: 0 10*3/uL (ref 0.0–0.1)
Basophils Relative: 0 % (ref 0–1)
Eosinophils Absolute: 0.2 10*3/uL (ref 0.0–0.7)
Eosinophils Relative: 4 % (ref 0–5)
HCT: 29.9 % — ABNORMAL LOW (ref 36.0–46.0)
Hemoglobin: 9.1 g/dL — ABNORMAL LOW (ref 12.0–15.0)
LYMPHS ABS: 2 10*3/uL (ref 0.7–4.0)
LYMPHS PCT: 37 % (ref 12–46)
MCH: 23 pg — AB (ref 26.0–34.0)
MCHC: 30.4 g/dL (ref 30.0–36.0)
MCV: 75.7 fL — AB (ref 78.0–100.0)
Monocytes Absolute: 0.5 10*3/uL (ref 0.1–1.0)
Monocytes Relative: 9 % (ref 3–12)
Neutro Abs: 2.8 10*3/uL (ref 1.7–7.7)
Neutrophils Relative %: 50 % (ref 43–77)
Platelets: 190 10*3/uL (ref 150–400)
RBC: 3.95 MIL/uL (ref 3.87–5.11)
RDW: 14.6 % (ref 11.5–15.5)
WBC: 5.5 10*3/uL (ref 4.0–10.5)

## 2014-06-07 LAB — GLUCOSE, CAPILLARY
Glucose-Capillary: 92 mg/dL (ref 70–99)
Glucose-Capillary: 103 mg/dL — ABNORMAL HIGH (ref 70–99)

## 2014-06-07 MED ORDER — CLOBETASOL PROPIONATE 0.05 % EX OINT
TOPICAL_OINTMENT | Freq: Two times a day (BID) | CUTANEOUS | Status: DC
  Administered 2014-06-07: 12:00:00 via TOPICAL
  Filled 2014-06-07: qty 15

## 2014-06-07 MED ORDER — DICYCLOMINE HCL 10 MG PO CAPS: 10.0000 mg | ORAL_CAPSULE | Freq: Three times a day (TID) | ORAL | Status: DC

## 2014-06-07 MED ORDER — CLOBETASOL PROPIONATE 0.05 % EX CREA
TOPICAL_CREAM | Freq: Two times a day (BID) | CUTANEOUS | Status: DC
  Filled 2014-06-07: qty 15

## 2014-06-07 MED ORDER — DOCUSATE SODIUM 100 MG PO CAPS: 200.0000 mg | ORAL_CAPSULE | Freq: Every day | ORAL | Status: DC

## 2014-06-07 MED ORDER — ONDANSETRON HCL 4 MG PO TABS: 4.0000 mg | ORAL_TABLET | Freq: Four times a day (QID) | ORAL | Status: DC | PRN

## 2014-06-07 MED ORDER — RISAQUAD PO CAPS: 2.0000 | ORAL_CAPSULE | Freq: Every day | ORAL | Status: DC

## 2014-06-07 NOTE — Discharge Summary (Signed)
Physician Discharge Summary  [name] BZJ:[id number] DOB: 1951/01/11 DOA: 06/01/2014  PCP: Tula Nakayama, MD  Admit date: 06/01/2014 Discharge date: 06/07/2014  Time spent: 40 minutes  Recommendations for Outpatient Follow-up:  1. Follow up with gastroenterology to pursue further work up of nausea/vomiting including gastric emptying study. There are also plans for outpatient colonoscopy 2. Follow up with primary care physician in 2 weeks.  Discharge Diagnoses:  Active Problems:   Essential hypertension   Bipolar disorder   Diabetes mellitus, insulin dependent (IDDM), uncontrolled   Anemia   Seizure disorder   Enteritis   Generalized abdominal pain   Nausea with vomiting   Discharge Condition: stable  Diet recommendation: low salt, low carb  Filed Weights   06/01/14 2225 06/02/14 0500  Weight: 77.111 kg (170 lb) 79 kg (174 lb 2.6 oz)    History of present illness:  This patient was admitted to the hospital with nausea, emesis, generalized as epigastric abdominal pain for 1-2 days. She did not have any fevers or chills. No known sick contacts. She was CT scan in the emergency room which indicated possible enteritis. She was admitted for further treatments  Hospital Course:  Patient was initially started on ciprofloxacin and Flagyl. She remained afebrile, did not have any loose stools during her hospital stay. Infection was constipated. She continued to have abdominal pain and nausea and therefore gastric neurology was consulted. It was felt that her symptoms were likely multifactorial related to an element of possible gastroparesis, GERD and likely irritable bowel syndrome. She was started on Bentyl as well as scheduled Zofran. Antibiotics were discontinued. Her nausea improved to the point that she was tolerating solid food. Although she was not vomiting, she did still complain of nausea. She is not having any diarrhea. Abdominal pain persisted, but she does have an element of  chronic pain. Patient does take Dilaudid as an outpatient. On several visits, she was noted to be sleeping in bed. Per gastroenterology, further workup including gastric emptying screening colonoscopy would need to be done on an outpatient basis. She is cleared for discharge by gastroenterology. She will follow up with them for further management.  Procedures:    Consultations:  gastroenterology  Discharge Exam: Filed Vitals:   06/07/14 0600  BP: 121/60  Pulse: 49  Temp: 98 F (36.7 C)  Resp: 20    General: NAD, sleeping on my arrival  Cardiovascular: s1, s2, rrr Respiratory: cta b  Discharge Instructions   Discharge Instructions    Diet - low sodium heart healthy    Complete by:  As directed      Face-to-face encounter (required for Medicare/Medicaid patients)    Complete by:  As directed   I Shaquayla Klimas certify that this patient is under my care and that I, or a nurse practitioner or physician's assistant working with me, had a face-to-face encounter that meets the physician face-to-face encounter requirements with this patient on 06/07/2014. The encounter with the patient was in whole, or in part for the following medical condition(s) which is the primary reason for home health care (List medical condition): resuming home health services of PT, RN for deconditioning and nausea/vomiting  The encounter with the patient was in whole, or in part, for the following medical condition, which is the primary reason for home health care:  nausea vomiting  I certify that, based on my findings, the following services are medically necessary home health services:   Nursing Physical therapy    Reason for Medically Necessary  Home Health Services:  Skilled Nursing- Skilled Assessment/Observation  My clinical findings support the need for the above services:  Unable to leave home safely without assistance and/or assistive device  Further, I certify that my clinical findings support that  this patient is homebound due to:  Unable to leave home safely without assistance     Home Health    Complete by:  As directed   To provide the following care/treatments:   PT RN       Increase activity slowly    Complete by:  As directed           Discharge Medication List as of 06/07/2014 12:35 PM    START taking these medications   Details  acidophilus (RISAQUAD) CAPS capsule Take 2 capsules by mouth daily., Starting 06/07/2014, Until Discontinued, Print    docusate sodium (COLACE) 100 MG capsule Take 2 capsules (200 mg total) by mouth at bedtime., Starting 06/07/2014, Until Discontinued, Print    ondansetron (ZOFRAN) 4 MG tablet Take 1 tablet (4 mg total) by mouth every 6 (six) hours as needed for nausea., Starting 06/07/2014, Until Discontinued, Print      CONTINUE these medications which have CHANGED   Details  dicyclomine (BENTYL) 10 MG capsule Take 1 capsule (10 mg total) by mouth 3 (three) times daily before meals., Starting 06/07/2014, Until Discontinued, Print      CONTINUE these medications which have NOT CHANGED   Details  aspirin EC 81 MG tablet Take 81 mg by mouth daily., Until Discontinued, Historical Med    Cholecalciferol (D 5000) 5000 UNITS capsule Take 5,000 Units by mouth daily., Until Discontinued, Historical Med    cloNIDine (CATAPRES) 0.3 MG tablet TAKE 1 TABLET BY MOUTH EVERY EIGHT HOURS. ONCE AT 8AM, 4PM, AND 12 MIDNIGHT., Normal    CRESTOR 5 MG tablet TAKE 1 TABLET BY MOUTH AT BEDTIME., Normal    diclofenac sodium (VOLTAREN) 1 % GEL Apply 2 g topically daily as needed (Pain)., Until Discontinued, Historical Med    fluticasone (FLONASE) 50 MCG/ACT nasal spray Place 2 sprays into both nostrils daily., Until Discontinued, Historical Med    HYDROmorphone (DILAUDID) 4 MG tablet Take 4 mg by mouth 3 (three) times daily as needed for moderate pain. , Starting 02/20/2014, Until Discontinued, Historical Med    hydroxychloroquine (PLAQUENIL) 200 MG tablet Take  200 mg by mouth daily., Until Discontinued, Historical Med    insulin aspart (NOVOLOG FLEXPEN) 100 UNIT/ML FlexPen Inject 12-18 Units into the skin 3 (three) times daily with meals., Starting 04/06/2014, Until Discontinued, Normal    Insulin Glargine (TOUJEO SOLOSTAR) 300 UNIT/ML SOPN Inject 40 Units into the skin at bedtime., Starting 04/10/2014, Until Discontinued, Normal    lamoTRIgine (LAMICTAL) 100 MG tablet Take 1.5 tablets (150 mg total) by mouth every morning., Starting 02/23/2014, Until Discontinued, Normal    levothyroxine (LEVOXYL) 50 MCG tablet Take 1 tablet (50 mcg total) by mouth every morning., Starting 05/05/2014, Until Discontinued, Normal    LORazepam (ATIVAN) 0.5 MG tablet Take 0.5 mg by mouth at bedtime. , Until Discontinued, Historical Med    losartan (COZAAR) 100 MG tablet TAKE 1 TABLET BY MOUTH ONCE DAILY., Normal    meclizine (ANTIVERT) 12.5 MG tablet Take 1 tablet (12.5 mg total) by mouth 3 (three) times daily as needed. For nausea, Starting 03/11/2014, Until Discontinued, Normal    methocarbamol (ROBAXIN) 500 MG tablet Take 500 mg by mouth every morning. , Until Discontinued, Historical Med    metoprolol (LOPRESSOR) 50 MG tablet  TAKE 1 TABLET BY MOUTH TWICE DAILY., Normal    mirtazapine (REMERON) 30 MG tablet Take 30 mg by mouth at bedtime. , Until Discontinued, Historical Med    mometasone-formoterol (DULERA) 100-5 MCG/ACT AERO Inhale 2 puffs into the lungs 2 (two) times daily., Starting 10/30/2013, Until Discontinued, Sample    montelukast (SINGULAIR) 10 MG tablet TAKE ONE TABLET BY MOUTH ONCE DAILY., Normal    Multiple Vitamins-Minerals (ONE-A-DAY 50 PLUS PO) Take 1 tablet by mouth daily., Until Discontinued, Historical Med    nystatin (MYCOSTATIN) powder Apply topically 4 (four) times daily., Starting 03/11/2014, Until Discontinued, Normal    pregabalin (LYRICA) 75 MG capsule Take 75 mg by mouth 2 (two) times daily. , Starting 10/26/2011, Until Discontinued,  Historical Med    RABEprazole (ACIPHEX) 20 MG tablet TAKE 1 TABLET BY MOUTH TWICE DAILY., Normal    RESTASIS 0.05 % ophthalmic emulsion Place 1 drop into both eyes 2 (two) times daily. , Starting 02/04/2014, Until Discontinued, Historical Med    sertraline (ZOLOFT) 100 MG tablet Take 100 mg by mouth every morning. , Until Discontinued, Historical Med    spironolactone (ALDACTONE) 25 MG tablet TAKE ONE TABLET BY MOUTH DAILY., Normal    thioridazine (MELLARIL) 10 MG tablet Take 20 mg by mouth at bedtime. , Starting 08/16/2012, Until Discontinued, Historical Med    traZODone (DESYREL) 100 MG tablet Take 100 mg by mouth at bedtime., Starting 04/13/2014, Until Discontinued, Historical Med    varenicline (CHANTIX PAK) 0.5 MG X 11 & 1 MG X 42 tablet Take one 0.5 mg tablet by mouth once daily for 3 days, then increase to one 0.5 mg tablet twice daily for 4 days, then increase to one 1 mg tablet twice daily., Print    ZETIA 10 MG tablet TAKE ONE TABLET BY MOUTH ONCE DAILY., Normal    glucose blood (ACCU-CHEK AVIVA PLUS) test strip Use to test blood sugar 4 times daily as instructed. Dx code: E10.65, Normal    Insulin Pen Needle (UNIFINE PENTIPS) 31G X 6 MM MISC Use to inject insulin 4 times daily as directed., Normal      STOP taking these medications     hydrochlorothiazide (HYDRODIURIL) 25 MG tablet        Allergies  Allergen Reactions  . Cephalexin Hives  . Iron Nausea And Vomiting  . Milk-Related Compounds Other (See Comments)    Doesn't agree with stomach.   . Penicillins Hives  . Phenazopyridine Hcl Other (See Comments)    [place]        The results of significant diagnostics from this hospitalization (including imaging, microbiology, ancillary and laboratory) are listed below for reference.    Significant Diagnostic Studies: Ct Abdomen Pelvis W Contrast  06/02/2014   CLINICAL DATA:  Initial evaluation for acute sudden onset shooting and cramping abdominal pain with associated  nausea and vomiting.  EXAM: CT ABDOMEN AND PELVIS WITH CONTRAST  TECHNIQUE: Multidetector CT imaging of the abdomen and pelvis was performed using the standard protocol following bolus administration of intravenous contrast.  CONTRAST:  79mL OMNIPAQUE IOHEXOL 300 MG/ML SOLN, 116mL OMNIPAQUE IOHEXOL 300 MG/ML SOLN  COMPARISON:  Prior CT from 07/16/2012  FINDINGS: The visualized lung bases are clear. No pleural or pericardial effusion. Scattered coronary artery and valvular calcifications noted.  The liver demonstrates a normal contrast enhanced appearance. Gallbladder is absent. No biliary dilatation. Spleen, adrenal glands, and pancreas demonstrate a normal contrast enhanced appearance.  Kidneys are equal in size with symmetric enhancement. No nephrolithiasis, hydronephrosis, or  focal enhancing renal mass.  Stomach within normal limits. No evidence for bowel obstruction. Appendix well visualized within the right lower quadrant and is of normal caliber and appearance without associated inflammatory changes to suggest acute appendicitis. There is mild hazy fat stranding about several loops of small bowel clustered in the lower mid abdomen just to the right of midline (series 2, image 57). Findings are nonspecific, but suggestive of possible acute enteritis. Small volume free fluid along the right pericolic fascia noted, which may be reactive from this process. The transverse colon traverses this region, but is favored to be within normal limits, and not the source of inflammation.  Bladder within normal limits.  Uterus and ovaries are absent.  No free intraperitoneal air. No pathologically enlarged intra-abdominal pelvic lymph nodes. Normal intravascular enhancement seen within the abdomen and pelvis. Scattered atherosclerotic plaque noted within the aorta.  No acute osseous abnormality. No worrisome lytic or blastic osseous lesions. Scattered multilevel degenerative changes noted within the visualized spine.   IMPRESSION: 1. Hazy fat stranding about several loops of small bowel in the lower mid abdomen. Finding is nonspecific, but suggestive of possible acute enteritis. 2. No other acute abnormality within the abdomen and pelvis. 3. Status post cholecystectomy.   Electronically Signed   By: Jeannine Boga M.D.   On: 06/02/2014 02:44   Dg Chest Portable 1 View  06/02/2014   CLINICAL DATA:  Acute sudden onset abdominal pain  EXAM: PORTABLE CHEST - 1 VIEW  COMPARISON:  05/05/2014  FINDINGS: A single AP portable view of the chest demonstrates no focal airspace consolidation or alveolar edema. The lungs are grossly clear. There is no large effusion or pneumothorax. Cardiac and mediastinal contours appear unremarkable.  IMPRESSION: No active disease.   Electronically Signed   By: Andreas Newport M.D.   On: 06/02/2014 00:40    Microbiology: Recent Results (from the past 240 hour(s))  MRSA PCR Screening     Status: None   Collection Time: 06/02/14 11:25 AM  Result Value Ref Range Status   MRSA by PCR NEGATIVE NEGATIVE Final    Comment:        The GeneXpert MRSA Assay (FDA approved for NASAL specimens only), is one component of a comprehensive MRSA colonization surveillance program. It is not intended to diagnose MRSA infection nor to guide or monitor treatment for MRSA infections.   Clostridium Difficile by PCR     Status: None   Collection Time: 06/04/14 10:43 PM  Result Value Ref Range Status   C difficile by pcr NEGATIVE NEGATIVE Final     Labs: Basic Metabolic Panel:  Recent Labs Lab 06/02/14 0604 06/03/14 0551 06/04/14 0545 06/05/14 0607 06/06/14 0612  NA 139 142 139 140 139  K 4.1 3.5 3.7 3.7 3.5  CL 107 112 110 110 109  CO2 23 24 25 25 25   GLUCOSE 160* 86 119* 131* 152*  BUN 17 12 9 7 7   CREATININE 0.91 1.01 0.94 0.98 1.17*  CALCIUM 8.3* 8.0* 8.2* 8.3* 8.4   Liver Function Tests:  Recent Labs Lab 06/02/14 0604 06/03/14 0551 06/04/14 0545 06/05/14 0607  06/06/14 0612  AST 24 17 19 24 26   ALT 18 15 16 18 20   ALKPHOS 87 75 79 79 75  BILITOT 0.4 0.5 0.4 0.4 0.4  PROT 6.7 6.2 6.0 6.0 5.8*  ALBUMIN 3.3* 3.2* 3.1* 3.1* 3.0*    Recent Labs Lab 06/02/14 0030  LIPASE 23   No results for input(s): AMMONIA in the last  168 hours. CBC:  Recent Labs Lab 06/03/14 0551 06/04/14 0545 06/05/14 0607 06/06/14 0612 06/07/14 0700  WBC 5.1 5.7 5.2 5.4 5.5  NEUTROABS 2.4 2.4 2.5 2.7 2.8  HGB 8.9* 8.9* 8.7* 8.7* 9.1*  HCT 30.3* 29.8* 29.2* 29.7* 29.9*  MCV 76.1* 76.0* 75.8* 75.8* 75.7*  PLT 204 190 170 181 190   Cardiac Enzymes:  Recent Labs Lab 06/02/14 0030  TROPONINI <0.03   BNP: BNP (last 3 results) No results for input(s): BNP in the last 8760 hours.  ProBNP (last 3 results) No results for input(s): PROBNP in the last 8760 hours.  CBG:  Recent Labs Lab 06/06/14 1133 06/06/14 1614 06/06/14 2018 06/07/14 0810 06/07/14 1151  GLUCAP 100* 130* 164* 92 103*       Signed:  Casmere Hollenbeck  [facility] 06/07/2014, 3:35 PM

## 2014-06-07 NOTE — Progress Notes (Signed)
Patient states understanding of discharge instructions, prescriptions given. Home health orders and facesheet faxed to [facility] and message left on phone informing of patient's discharge.

## 2014-06-08 ENCOUNTER — Encounter: Payer: Self-pay | Admitting: Internal Medicine

## 2014-06-08 ENCOUNTER — Other Ambulatory Visit: Payer: Self-pay | Admitting: Gastroenterology

## 2014-06-08 DIAGNOSIS — R112 Nausea with vomiting, unspecified: Secondary | ICD-10-CM

## 2014-06-08 NOTE — Telephone Encounter (Signed)
Pt is set up for [facility] on 06/23/14 @ 10:00 and then a follow up appointment on 06/30/14 @ 10:30 am with [facility]. Pt is aware and letters has also been mailed out to her.

## 2014-06-08 NOTE — Telephone Encounter (Signed)
PA# for the GES is #[id number]

## 2014-06-08 NOTE — Progress Notes (Signed)
UR chart review completed.  

## 2014-06-08 NOTE — Telephone Encounter (Signed)
APPOINTMENT MADE AND PATIENT AWARE OF DATE AND TIME  °

## 2014-06-09 ENCOUNTER — Ambulatory Visit (HOSPITAL_COMMUNITY): Payer: Self-pay | Admitting: Psychiatry

## 2014-06-09 LAB — GI PATHOGEN PANEL BY PCR, STOOL
C DIFFICILE TOXIN A/B: NOT DETECTED
Campylobacter by PCR: NOT DETECTED
Cryptosporidium by PCR: NOT DETECTED
E coli (ETEC) LT/ST: NOT DETECTED
E coli (STEC): NOT DETECTED
E coli 0157 by PCR: NOT DETECTED
G lamblia by PCR: NOT DETECTED
NOROVIRUS G1/G2: NOT DETECTED
ROTAVIRUS A BY PCR: NOT DETECTED
SALMONELLA BY PCR: NOT DETECTED
SHIGELLA BY PCR: NOT DETECTED

## 2014-06-10 DIAGNOSIS — E119 Type 2 diabetes mellitus without complications: Secondary | ICD-10-CM | POA: Diagnosis not present

## 2014-06-10 DIAGNOSIS — F319 Bipolar disorder, unspecified: Secondary | ICD-10-CM | POA: Diagnosis not present

## 2014-06-10 DIAGNOSIS — G4733 Obstructive sleep apnea (adult) (pediatric): Secondary | ICD-10-CM | POA: Diagnosis not present

## 2014-06-10 DIAGNOSIS — I10 Essential (primary) hypertension: Secondary | ICD-10-CM | POA: Diagnosis not present

## 2014-06-10 DIAGNOSIS — F209 Schizophrenia, unspecified: Secondary | ICD-10-CM | POA: Diagnosis not present

## 2014-06-10 DIAGNOSIS — Z794 Long term (current) use of insulin: Secondary | ICD-10-CM | POA: Diagnosis not present

## 2014-06-10 IMAGING — CT CT MAXILLOFACIAL W/O CM
4 of 9 series · 13 of 47 positions shown, 15 images · non-contrast
Comparison: Most recent prior CT head and cervical spine
08/26/2012

CT HEAD

CLINICAL DATA: Fall, severe headache; history of prior craniotomy
for meningioma resection and cerebrovascular accident

CT HEAD WITHOUT CONTRAST
CT MAXILLOFACIAL WITHOUT CONTRAST
CT CERVICAL SPINE WITHOUT CONTRAST
TECHNIQUE: Multidetector CT imaging of the head, cervical spine,
and maxillofacial structures were performed using the standard
protocol without intravenous contrast. Multiplanar CT image
reconstructions of the cervical spine and maxillofacial structures
were also generated.

[Series 6: max st coronal · coronal · 0.25mm/px · 3 of 57 slices shown]
[im 19/57  bone]
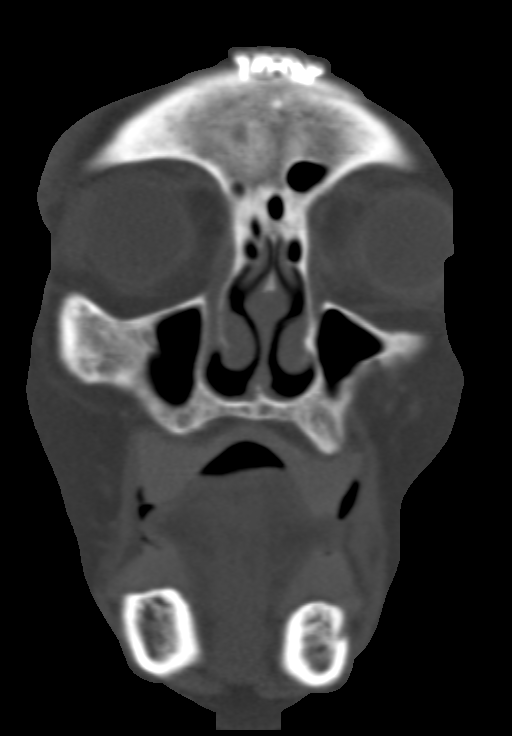
[im 29/57  bone]
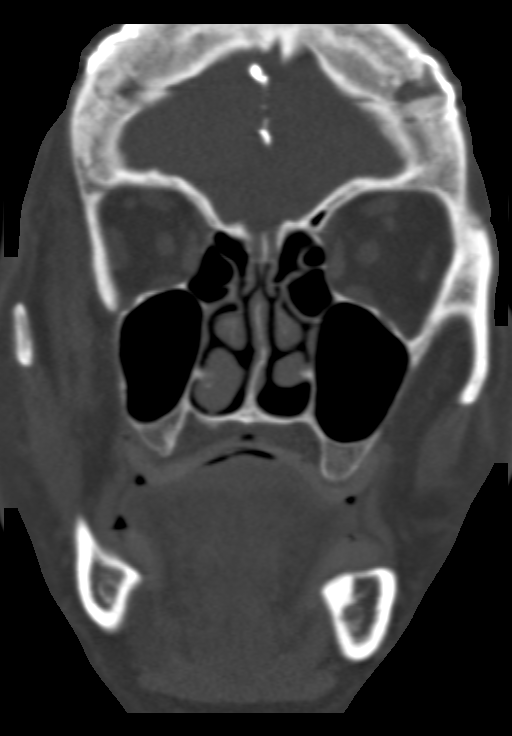
[im 38/57  bone]
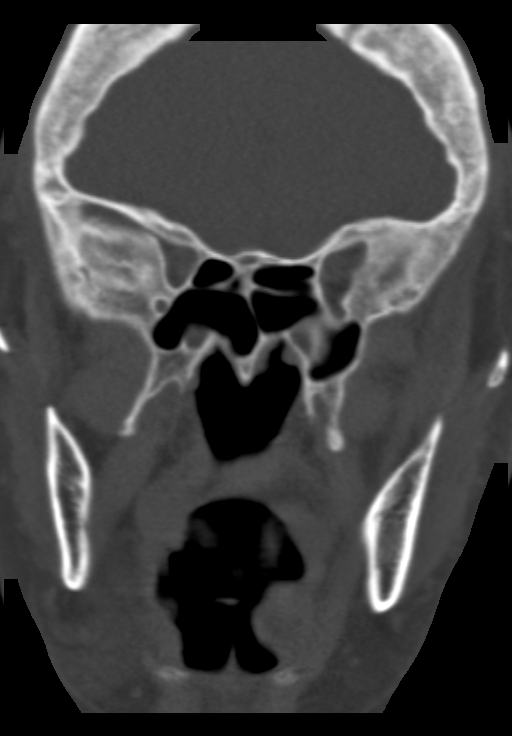

[Series 7: max st sagittal · sagittal · 0.26mm/px · 2 of 63 slices shown]
[im 21/63  bone]
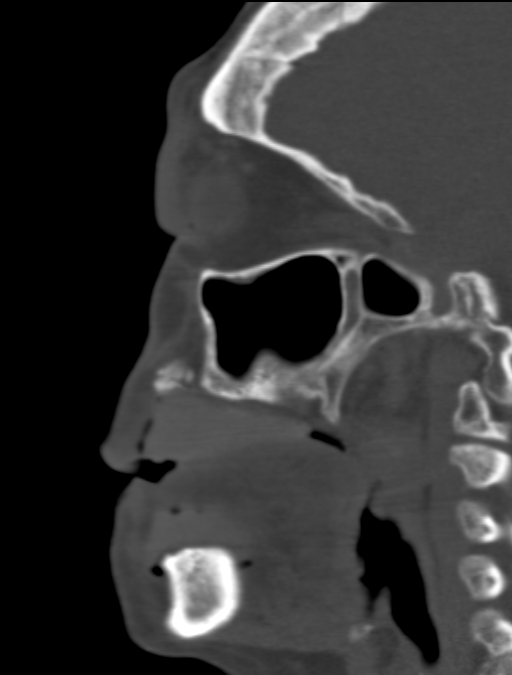
[im 42/63  bone]
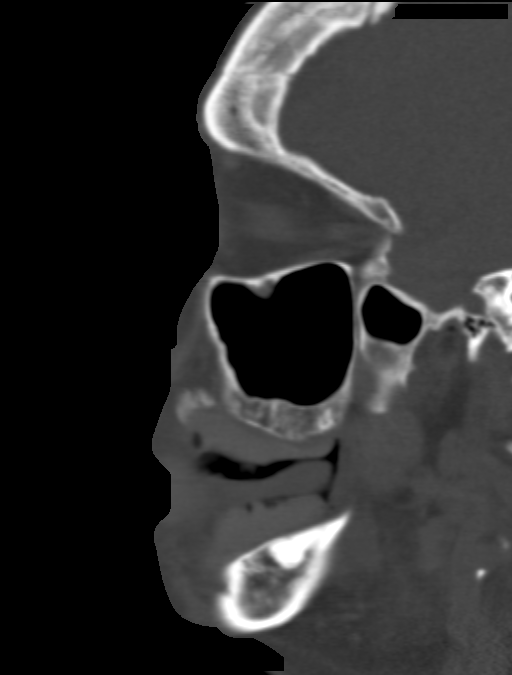

[Series 11: cervical st 2.0 b31s · axial · 0.27mm/px · z∈[+964,+1030]mm · 3 of 84 slices shown]
[im 17/84  bone]
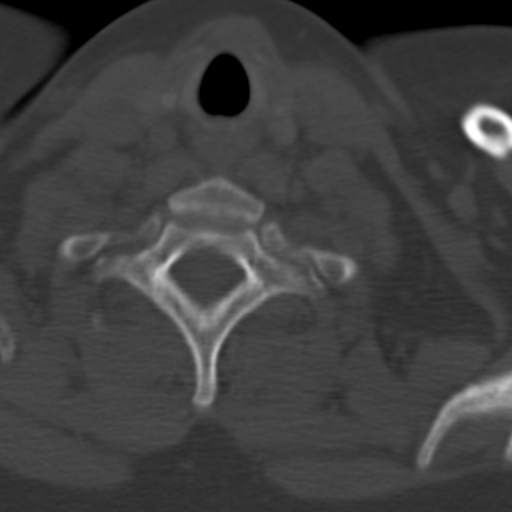
[im 34/84  bone]
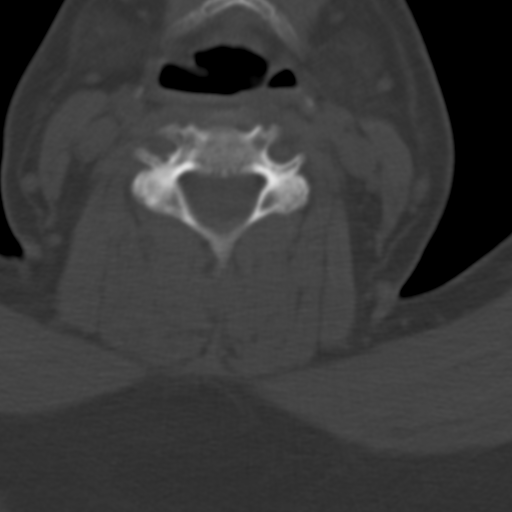
[im 50/84  bone]
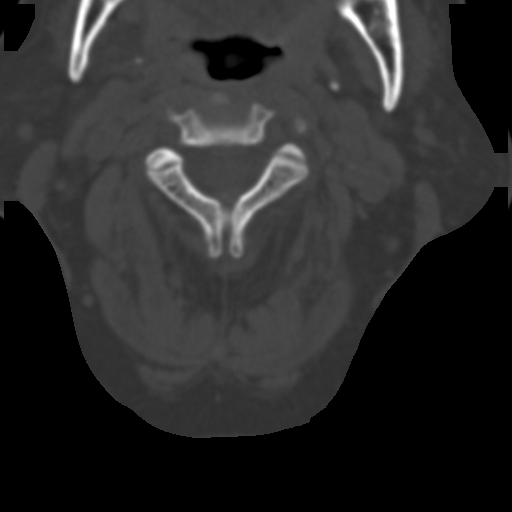

[Series 15: axial bone 2.0 · axial · 0.21mm/px · z∈[+925,+1037]mm · 5 of 90 slices shown, 7 images]
[im 15/90  brain]
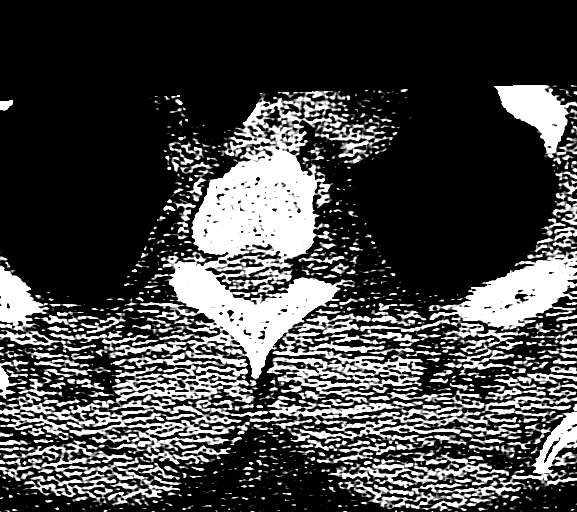
[im 15/90  bone]
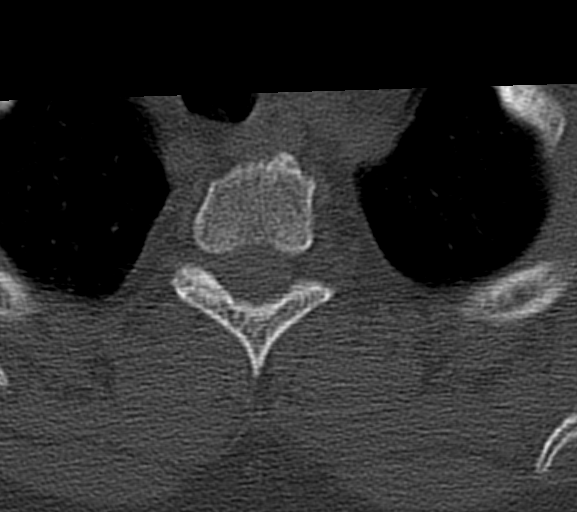
[im 30/90  bone]
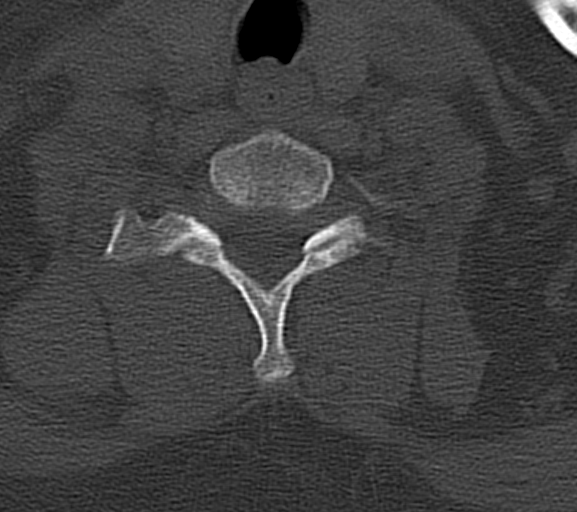
[im 45/90  bone]
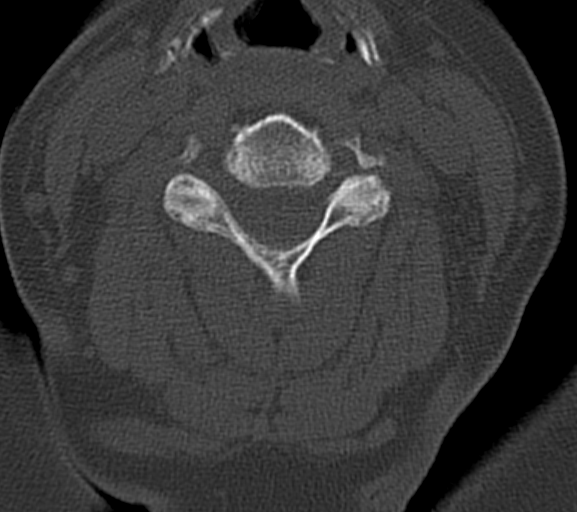
[im 60/90  bone]
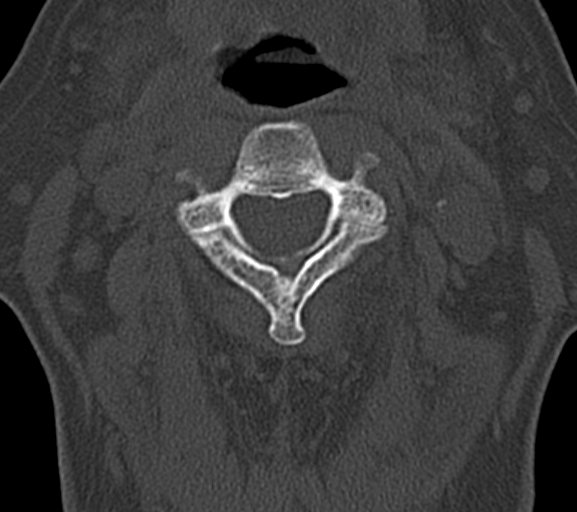
[im 75/90  brain]
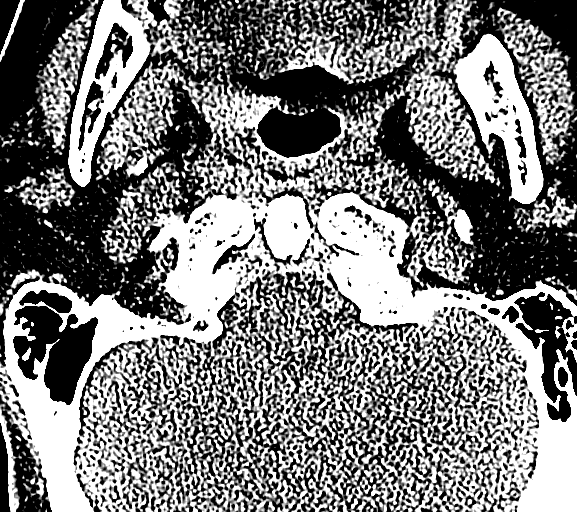
[im 75/90  bone]
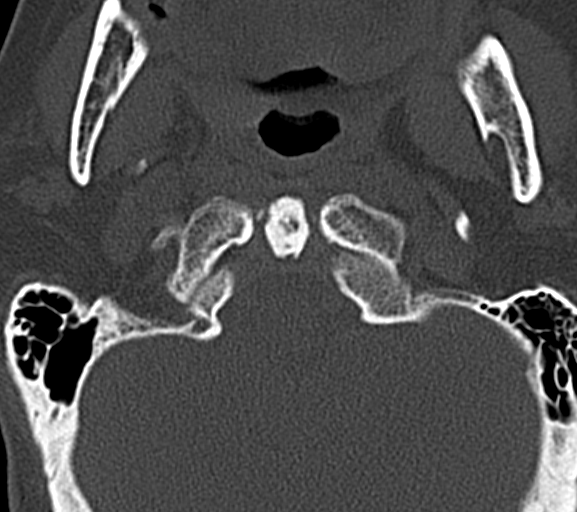

[13 of 47 positions shown; findings below may reference images not displayed]

FINDINGS: No acute intracranial hemorrhage, acute infarction, mass
lesion, mass effect, midline shift or hydrocephalus.  Gray-white
differentiation is preserved throughout.  Stable frontal
encephalomalacia in the region of prior meningioma resection. The
name Shomari focal soft tissue/scalp hematoma overlying the right
frontal orbital ridge and writes pre orbital soft tissues.  The
underlying globe and orbits appear intact and unremarkable.  Intact
frontal craniotomy with metallic fixation plates.  No acute
fracture identified.  Normal aeration of the mastoid air cells and
visualized paranasal sinuses.  Atherosclerotic calcifications noted
throughout the cavernous carotid arteries.
IMPRESSION: 1.  No acute intracranial abnormality.
2.  Right periorbital and frontal scalp hematoma/soft tissue
contusion without evidence of underlying orbital extension or
fracture.
3.  Stable appearance of frontal craniotomy and surgical changes of
prior meningioma resection.
4.  Intracranial internal carotid atherosclerosis.

CT MAXILLOFACIAL
FINDINGS: Soft tissue hematoma/contusion overlying the right
frontal orbital ridge and the preseptal right periorbital soft
tissues.  The globes and orbits are intact and symmetric.  The
right zygoma and orbit are intact without evidence of acute
fracture.  Normal aeration of the mastoid air cells and paranasal
sinuses.  The remainder the visualized bones and soft tissues are
unremarkable without evidence of acute injury. Partial
calcification of the stylohyoid ligament bilaterally noted
incidentally.  The patient is edentulous.  Residual frontal
calvarial hyperostosis.
IMPRESSION: Right preseptal periorbital soft tissue contusion without evidence
of underlying fracture, intraorbital extension or globe injury.

CT CERVICAL SPINE
FINDINGS: No acute fracture, malalignment or prevertebral soft
tissue swelling.  No acute soft tissue abnormality. Mild multilevel
cervical spondylosis without focality.  Unremarkable appearance of
the thyroid gland by CT.  Unremarkable visualized lung apices.  No
suspicious adenopathy.  Fatty infiltration of the bilateral parotid
glands noted incidentally.
IMPRESSION: No acute fracture or malalignment.

## 2014-06-11 ENCOUNTER — Other Ambulatory Visit (HOSPITAL_COMMUNITY): Payer: Self-pay

## 2014-06-13 IMAGING — MR MR CERVICAL SPINE W/O CM
4 of 5 series · 25 of 48 positions shown · non-contrast
Comparison: None

CLINICAL DATA: Neck and left shoulder pain.

MRI CERVICAL SPINE WITHOUT CONTRAST
TECHNIQUE: Multiplanar and multiecho pulse sequences of the
cervical spine, to include the craniocervical junction and
cervicothoracic junction, were obtained according to standard
protocol without intravenous contrast.

[Series 4: T2 · sagittal · 3.0mm · 0.43mm/px · 6 of 15 slices shown (1 of 2)]
[im 1/15]
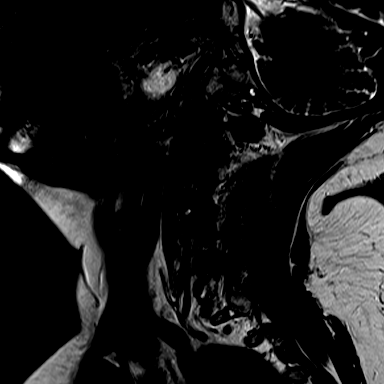
[im 3/15]
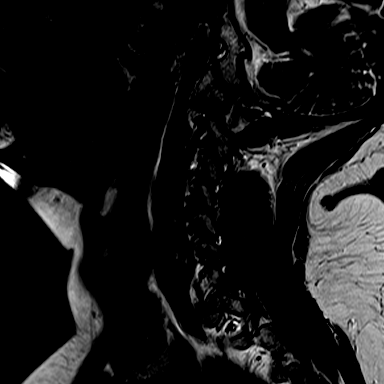
[im 6/15]
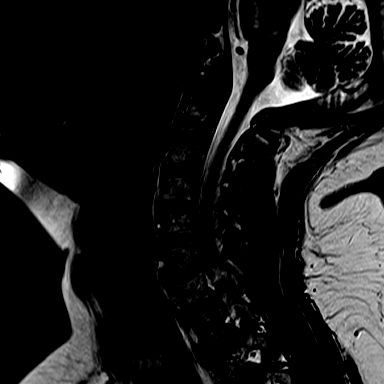
[im 9/15]
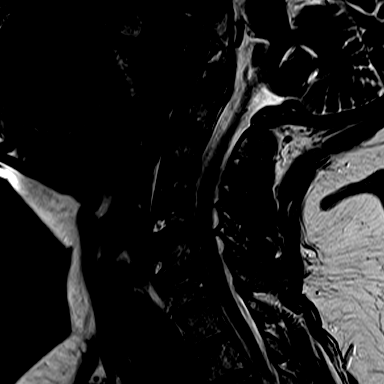
[im 12/15]
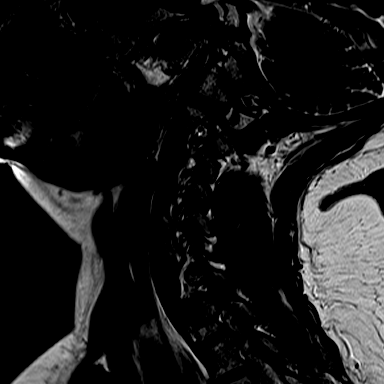
[im 15/15]
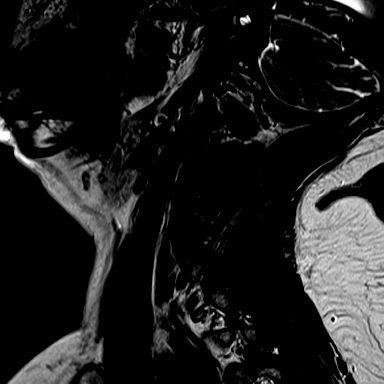

[Series 5: (id) sagital · sagittal · 3.0mm · 0.57mm/px · 7 of 15 slices shown]
[im 1/15]
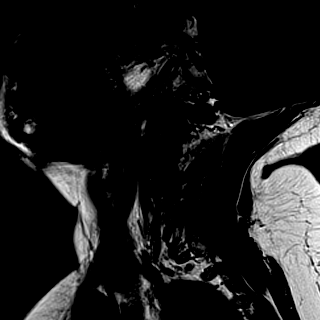
[im 3/15]
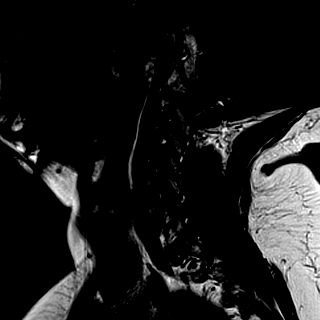
[im 5/15]
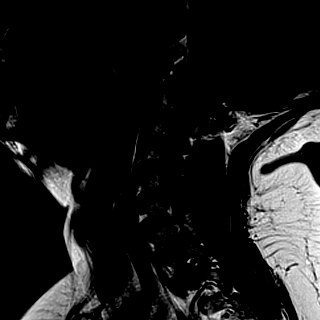
[im 8/15]
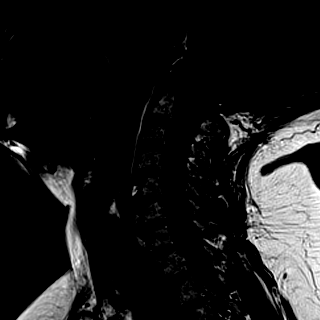
[im 10/15]
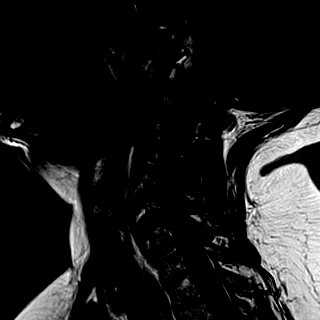
[im 12/15]
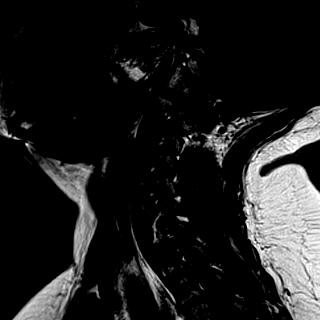
[im 15/15]
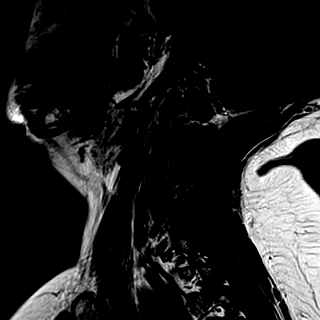

[Series 6: ir sagital · sagittal · 3.0mm · 0.33mm/px · 4 of 15 slices shown]
[im 1/15]
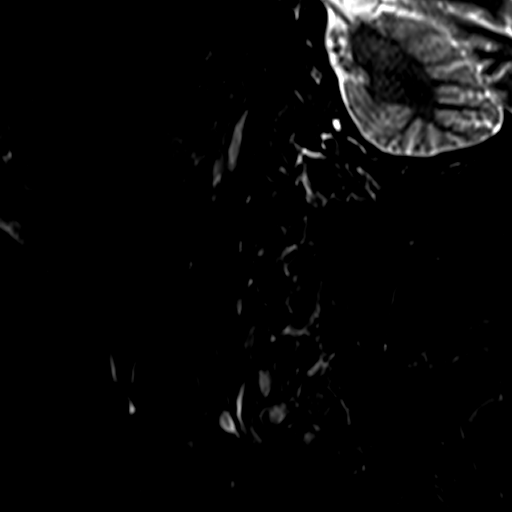
[im 3/15]
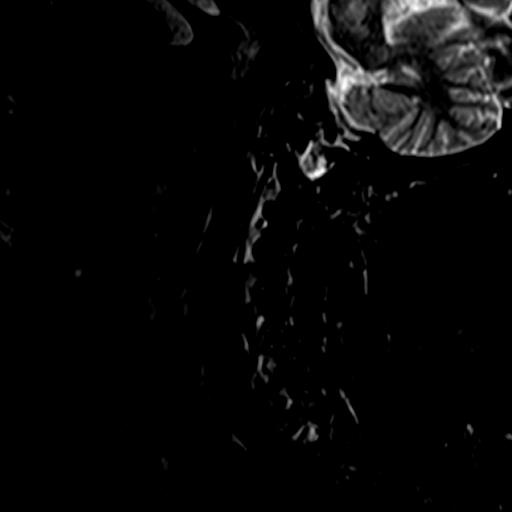
[im 8/15]
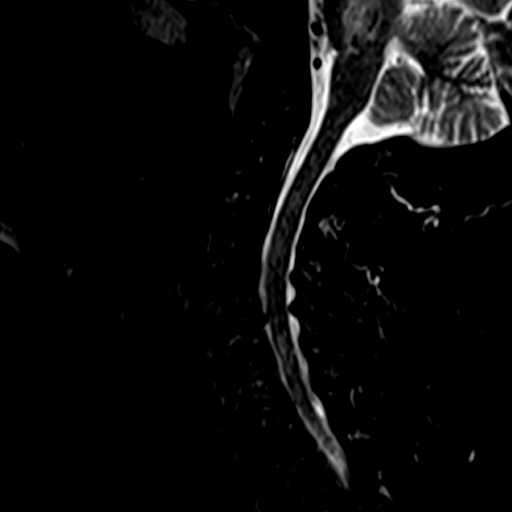
[im 12/15]
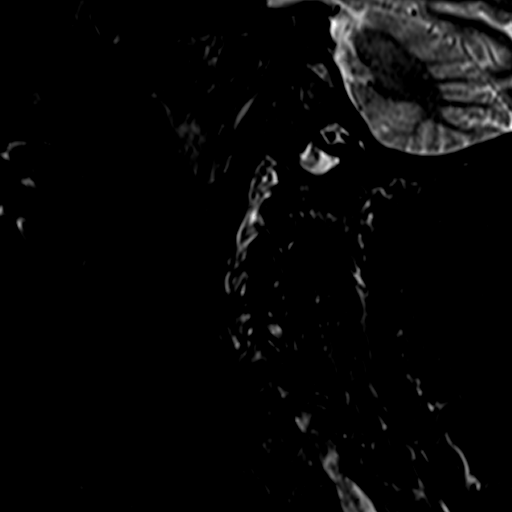

[Series 8: T2 · axial · 3.0mm · 0.19mm/px · z∈[-35,+65]mm · 8 of 32 slices shown (2 of 2)]
[im 1/32]
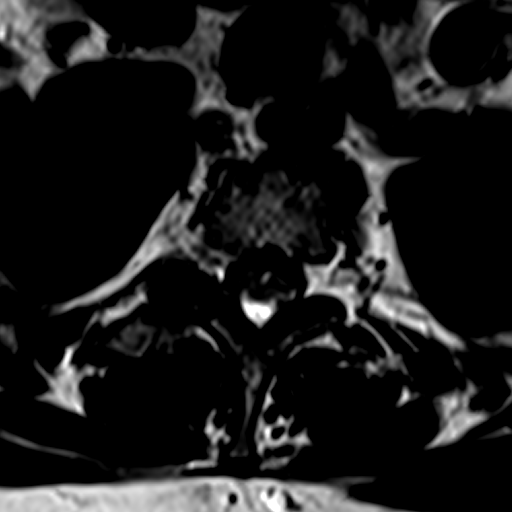
[im 5/32]
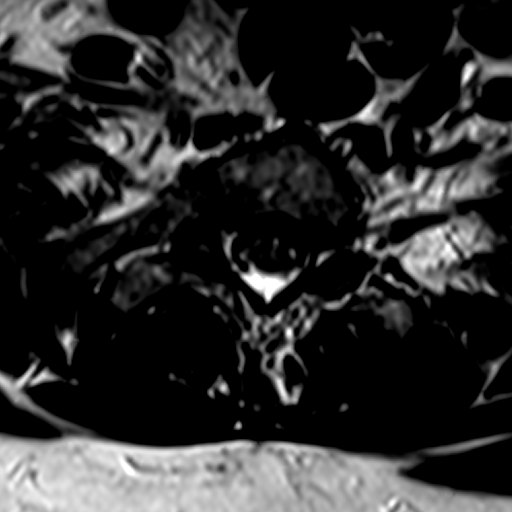
[im 10/32]
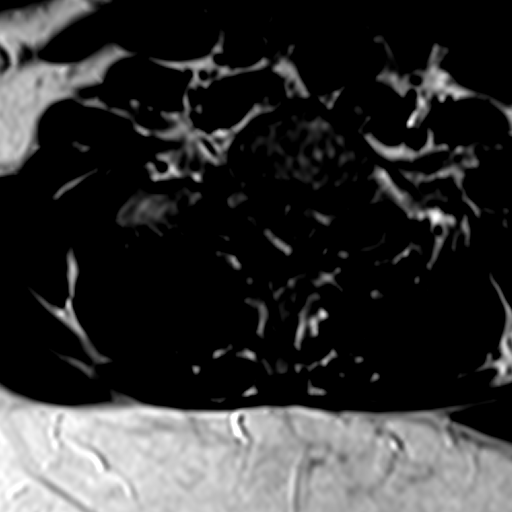
[im 15/32]
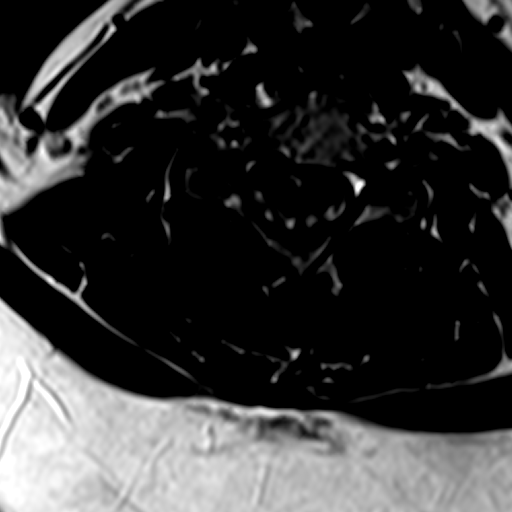
[im 17/32]
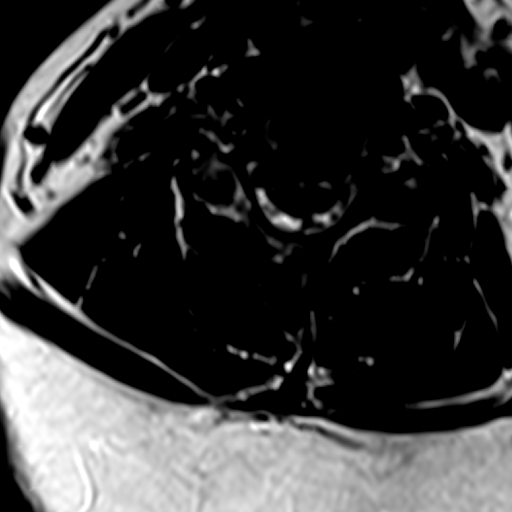
[im 22/32]
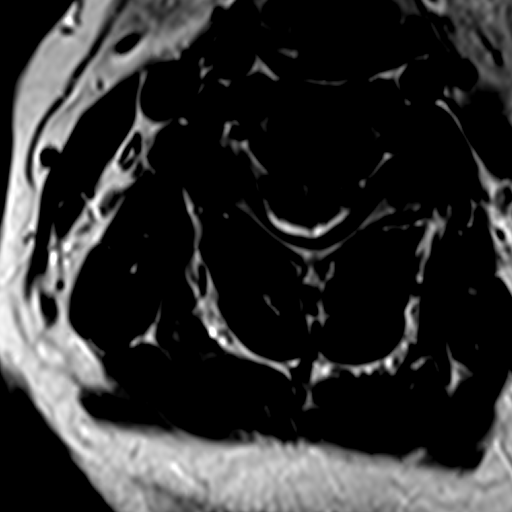
[im 27/32]
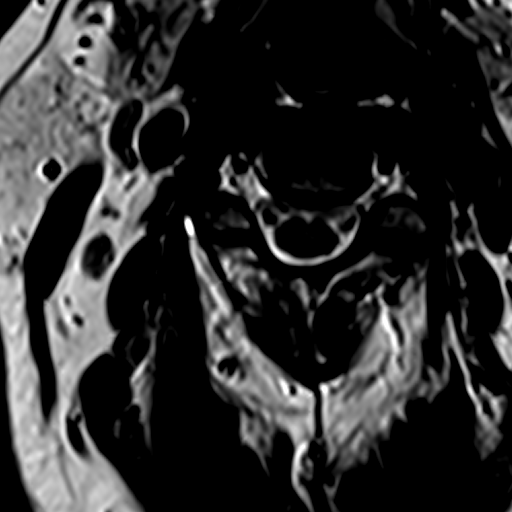
[im 32/32]
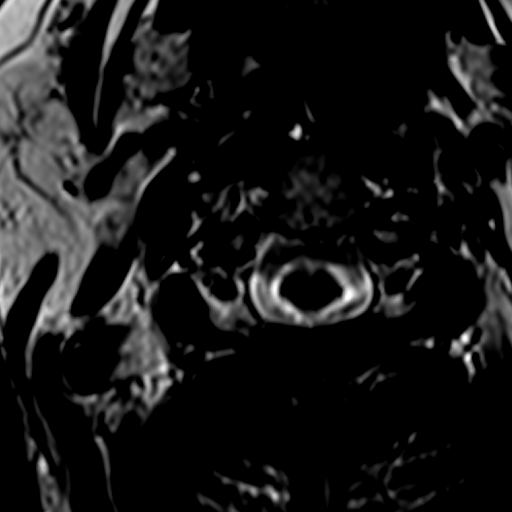

[25 of 48 positions shown; findings below may reference images not displayed]

FINDINGS: The sagittal MR images demonstrate normal overall
alignment of the cervical vertebral bodies.  They demonstrate
normal marrow signal.  The cervical spinal cord demonstrates normal
signal intensity.  No Chiari malformation.  Incidental note is made
of white matter disease in the pons.

Degenerative changes at C1-2 with mild pannus formation but no
significant mass effect on the upper cervical cord.  Moderate facet
disease.

C2-3:  No significant findings.

C3-4:  Shallow central disc protrusion but no significant neural
compression.  No foraminal stenosis.

C4-5:  Mild annular bulge.  No significant spinal or foraminal
stenosis.

C5-6:  Diffuse annular bulge and central disc protrusion with mild
mass effect on the ventral thecal sac and focal narrowing of the
ventral CSF space.  Mild osteophytic ridging and uncinate spurring
with mild foraminal encroachment bilaterally.

C6-7:  Bulging annulus, osteophytic ridging and uncinate spurring
but no significant spinal stenosis.  There is mild left greater
than right foraminal stenosis.

C7-T1:  Degenerative disc disease with a shallow left paracentral
and medial foraminal disc protrusion. There is also moderate facet
disease.
IMPRESSION: 1.  Shallow central disc protrusion at C3-4.
2.  Degenerative disc disease and facet disease at C5-6 with a
central disc protrusion.
3.  Shallow left paracentral and medial foraminal disc protrusion
at C7-T1.

## 2014-06-15 ENCOUNTER — Ambulatory Visit: Payer: Self-pay

## 2014-06-15 ENCOUNTER — Telehealth: Payer: Self-pay

## 2014-06-15 DIAGNOSIS — E119 Type 2 diabetes mellitus without complications: Secondary | ICD-10-CM | POA: Diagnosis not present

## 2014-06-15 DIAGNOSIS — I10 Essential (primary) hypertension: Secondary | ICD-10-CM | POA: Diagnosis not present

## 2014-06-15 DIAGNOSIS — F319 Bipolar disorder, unspecified: Secondary | ICD-10-CM | POA: Diagnosis not present

## 2014-06-15 DIAGNOSIS — Z794 Long term (current) use of insulin: Secondary | ICD-10-CM | POA: Diagnosis not present

## 2014-06-15 DIAGNOSIS — F209 Schizophrenia, unspecified: Secondary | ICD-10-CM | POA: Diagnosis not present

## 2014-06-15 MED ORDER — FLUCONAZOLE 150 MG PO TABS: 150.0000 mg | ORAL_TABLET | Freq: Once | ORAL | Status: DC

## 2014-06-15 NOTE — Telephone Encounter (Signed)
Med sent.

## 2014-06-15 NOTE — Telephone Encounter (Signed)
[name] from [place] states that pt has a yeast infection from receiving IV antibiotics in the hospital. Wants fluconazole called in. Please advise

## 2014-06-16 DIAGNOSIS — F319 Bipolar disorder, unspecified: Secondary | ICD-10-CM | POA: Diagnosis not present

## 2014-06-16 DIAGNOSIS — I10 Essential (primary) hypertension: Secondary | ICD-10-CM | POA: Diagnosis not present

## 2014-06-16 DIAGNOSIS — F209 Schizophrenia, unspecified: Secondary | ICD-10-CM | POA: Diagnosis not present

## 2014-06-16 DIAGNOSIS — E119 Type 2 diabetes mellitus without complications: Secondary | ICD-10-CM | POA: Diagnosis not present

## 2014-06-16 DIAGNOSIS — Z794 Long term (current) use of insulin: Secondary | ICD-10-CM | POA: Diagnosis not present

## 2014-06-19 DIAGNOSIS — M5440 Lumbago with sciatica, unspecified side: Secondary | ICD-10-CM | POA: Diagnosis not present

## 2014-06-22 ENCOUNTER — Other Ambulatory Visit: Payer: Self-pay | Admitting: Family Medicine

## 2014-06-22 ENCOUNTER — Ambulatory Visit (INDEPENDENT_AMBULATORY_CARE_PROVIDER_SITE_OTHER): Payer: Commercial Managed Care - HMO | Admitting: Internal Medicine

## 2014-06-22 ENCOUNTER — Encounter: Payer: Self-pay | Admitting: Internal Medicine

## 2014-06-22 VITALS — BP 122/62 | HR 79 | Temp 98.0°F | Resp 12 | Wt 166.0 lb

## 2014-06-22 DIAGNOSIS — Z794 Long term (current) use of insulin: Secondary | ICD-10-CM

## 2014-06-22 DIAGNOSIS — E038 Other specified hypothyroidism: Secondary | ICD-10-CM

## 2014-06-22 DIAGNOSIS — E1165 Type 2 diabetes mellitus with hyperglycemia: Secondary | ICD-10-CM

## 2014-06-22 DIAGNOSIS — E1065 Type 1 diabetes mellitus with hyperglycemia: Secondary | ICD-10-CM | POA: Diagnosis not present

## 2014-06-22 DIAGNOSIS — IMO0001 Reserved for inherently not codable concepts without codable children: Secondary | ICD-10-CM

## 2014-06-22 DIAGNOSIS — F319 Bipolar disorder, unspecified: Secondary | ICD-10-CM | POA: Diagnosis not present

## 2014-06-22 DIAGNOSIS — E119 Type 2 diabetes mellitus without complications: Secondary | ICD-10-CM | POA: Diagnosis not present

## 2014-06-22 DIAGNOSIS — F209 Schizophrenia, unspecified: Secondary | ICD-10-CM | POA: Diagnosis not present

## 2014-06-22 DIAGNOSIS — I10 Essential (primary) hypertension: Secondary | ICD-10-CM | POA: Diagnosis not present

## 2014-06-22 MED ORDER — INSULIN ASPART 100 UNIT/ML FLEXPEN: 20.0000 [IU] | PEN_INJECTOR | Freq: Three times a day (TID) | SUBCUTANEOUS | Status: DC

## 2014-06-22 MED ORDER — INSULIN GLARGINE 300 UNIT/ML ~~LOC~~ SOPN: 50.0000 [IU] | PEN_INJECTOR | Freq: Every day | SUBCUTANEOUS | Status: DC

## 2014-06-22 NOTE — Progress Notes (Signed)
Patient ID: [name], female   DOB: 12/03/1950, 64 y.o.   MRN: [id number]  HPI: [name] is a 64 y.o.-year-old female, initially referred by her PCP, [name], for management of DM2, dx 1995, insulin-dependent since 1997, uncontrolled, with complications (gastroparesis, cerebrovasc. Ds-  H/o stroke, PN). Last visit 2.5 mo ago.  She was admitted with N/V/AP and found to have enteritis on CT scan (D/C summary reviewed). Since tx with ABx, sugars improved.  DM2: Last hemoglobin A1c was: Lab Results  Component Value Date   HGBA1C 7.3* 06/02/2014   HGBA1C 7.6* 03/11/2014   HGBA1C 8.3* 08/06/2013  10/28/2013: HbA1c 8.5%  She is now on: - Toujeo 50 units at bedtime - Novolog:  18 units before a small meal (B and L)  20 units before a regular meal  22 units before a large meal (D) Could not tolerate Metformin >> nausea.   We changed to above doses after she called with high sugars 05/27/14 (retrospectively, they were likely elevated 2/2 infection): Pt called stating her blood sugar has been high 300-400's last week. Please advise. Here is a list of blood sugars since last Friday.   3/11   3/12   3/13   3/14   3/15 266 AM  190 AM  174 AM  147 AM  174  AM 183 B/lunch 178 B/lunch 113 B/lunch 194 B/lunch 371 B/lunch 258 B/dinner 190 B/dinner 125 B/dinner 180 B/dinner 201 Bedtime 188 Bedtime 189 Bedtime 100  Bedtime  Pt checks her sugars 4 a day and they are: - am: 153, 200-300, some 400s >> 68-142, 164 >> 89-139, 161 >> 88-147, 184 - 2h after b'fast: n/c  - before lunch: 192-243 >> 78-192 >> 134-202 >> 90, 99-150, 198, 288 (forgot insulin) - 2h after lunch: n/c - before dinner: 200-292 >> 55 x1, 77-174 >> 91, 100-201 >> 96, 125-201, 260 - 2h after dinner: n/c - bedtime: 190-200s, rarely 300s >> 72, 94-210 >> 90, 103-198 >> 101-193 - nighttime: n/c No lows. Lowest sugar was 69 >> 88x1; she has hypoglycemia awareness at 70.  Highest sugar was 200s.  Pt's meals are: -  Breakfast: eggs, cereals, fruit, oatmeal, bacon - Lunch: sandwich, beef, mashed potatoes,diet soda - Dinner: chicken, beef, fish, potatoes, vegetables - Snacks: 1-2: fruit  She exercises 1x a day - walks.  - + mild CKD, last BUN/creatinine:  Lab Results  Component Value Date   BUN 7 06/06/2014   CREATININE 1.17* 06/06/2014  On Losartan. - last set of lipids: Lab Results  Component Value Date   CHOL 171 12/19/2013   HDL 45 12/19/2013   LDLCALC 78 12/19/2013   TRIG 240* 12/19/2013   CHOLHDL 3.8 12/19/2013  On Ezetimibe, was on Crestor, but stopped.  - last eye exam was in 09/2013. No DR. Had cataract sx B. - + numbness and tingling in her leg L leg (affected by stroke) but also in R. Sees podiatry.  Hypothyroidism: - dx > 10 years ago. - On Levoxyl 50 mcg daily:  - in am (at 7 am) - fasting - with water - >30 min b'f b'fast - no Ca - no Fe - + MVI at noon - + Aciphex moved at noon  Last TSH: Lab Results  Component Value Date   TSH 1.10 04/10/2014  Previously: Lab Results  Component Value Date   TSH 1.453 08/02/2011   TSH 0.666 09/14/2009   TSH 0.295  05/30/2009   TSH 1.126 03/18/2009   TSH  0.104  09/15/2008   TSH 0.066  09/14/2008   TSH 1.041  02/03/2008   TSH 0.992 09/25/2007   TSH 0.257 08/25/2007   TSH 1.181 05/23/2006   She has a h/o thyroid nodules. + dysphagia, + hoarseness.  Reviewed thyroid U/S (10/2013):  Right thyroid lobe: 3.7 x 1.0 x 1.8 cm   Left thyroid lobe: 3.5 x 1.5 x 1.6 cm   Isthmus: 0.5 cm   Focal nodules: There is a 0.3 mm calcified nodule in the right midzone. There is a 0.6 x 0.7 x 0.5 cm hypoechoic nodule in the  lateral aspect of the right midzone. There is a 0.7 x 0.6 x 0.5 cm hyperechoic nodule in the lateral aspect of the right lower pole.  There is a 2.2 x 1.1 x 1.7 cm complex solid nodule in the inferior aspect of the isthmus extending adjacent to the left lower pole.  There is a 1.4 x 2.1 x 1.4 cm complex solid nodule in the left  lower pole.   Lymphadenopathy: None visualized.  Repeat U/S (01/2014): stable appearance of nodules  FNA (11/05/2013) x2: benign    ROS: Constitutional: no weight gain/loss, + fatigue, + subjective hypothermia, + nocturia Eyes: no blurry vision, no xerophthalmia ENT: no sore throat, no nodules palpated in throat, no dysphagia/odynophagia, no hoarseness, + tinnitus Cardiovascular: no CP/SOB/palpitations/leg swelling Respiratory: no cough/SOB Gastrointestinal: no N/V/D/C Musculoskeletal: no muscle/joint aches Skin: no rashes Neurological: no tremors/numbness/tingling/dizziness  I reviewed pt's medications, allergies, PMH, social hx, family hx, and changes were documented in the history of present illness. Otherwise, unchanged from my initial visit note. Past Medical History  Diagnosis Date  . Bipolar disorder   . CVA (cerebral infarction)   . Pancreatitis 2006    due to Depakote with normal EUS   . Osteoporosis   . Chronic back pain   . Diabetes mellitus   . Trigger finger   . Anxiety disorder   . Hypertension   . Migraines     chronic headaches  . Diverticulosis     TCS 9/08 by Dr. Delfin Edis for diarrhea . Bx for micro scopic colitis negative.   . Schatzki's ring     non critical / EGD with ED 8/2011with RMR  . S/P colonoscopy 9629,5284, 2011    left-sided diverticula, hx of simple adenomas . 2011, random bx negative for microscopic colitis  . Glaucoma   . Allergic rhinitis   . Hypothyroidism     thyroid goiter  . Anemia   . Blood transfusion   . GERD (gastroesophageal reflux disease)   . Stroke     left sided weakness  . Seizures     unknown etiology-on meds-last seizure was 3 yerars ago  . Anxiety   . Depression   . Metabolic encephalopathy 1/32/4401  . Sleep apnea     on CPAP  . Arthritis   . Gum symptoms     infection on antibiotic  . Chronic neck pain   . Mononeuritis lower limb   . Frequent falls    Past Surgical History  Procedure Laterality  Date  . Abdominal hysterectomy  1978  . Cholecystectomy  1984  . Ovarian cyst removal    . Carpal tunnel release Left 07/22/04    Dr. Aline Brochure  . Breast reduction surgery  1994  . Cataract extraction Bilateral   . Biopsy thyroid  2009  . Surgical excision of 3 tumors from right thigh and right buttock  and left upper thigh  2010  .  Back surgery  July 2012  . Spine surgery  09/29/2010    Dr. Rolena Infante  . Maloney dilation  12/29/2010    RMR;  . Esophagogastroduodenoscopy  12/29/2010    Rourk-Retained food in the esophagus and stomach, small hiatal hernia, status post Maloney dilation of the esophagus  . Craniotomy  11/23/2011    Procedure: CRANIOTOMY TUMOR EXCISION;  Surgeon: Hosie Spangle, MD;  Location: [facility];  Service: Neurosurgery;  Laterality: N/A;  Craniotomy for tumor resection  . Brain surgery  11/2011    resection of meningioma  . Colonoscopy N/A 09/25/2012    [id number]:[id number] diverticulosis.  colonic polyp-removed : tubular adenoma  . Esophagogastroduodenoscopy N/A 09/25/2012    JQZ:[id number] atonic baggy esophagus status post Maloney dilation 84 F. Hiatal hernia  . Givens capsule study N/A 01/15/2013    NORMAL.   . Bacterial overgrowth test N/A 05/05/2013    Procedure: BACTERIAL OVERGROWTH TEST;  Surgeon: Daneil Dolin, MD;  Location: [facility];  Service: Endoscopy;  Laterality: N/A;  7:30  . Transthoracic echocardiogram  2010    EF 60-65%, mild conc LVH, grade 1 diastolic dysfunction; mildly calcified MV annulus with mildly thickened leaflets, mildly calcified MR annulus  . Nm myocar perf wall motion  2006    "relavtiely normal" persantine, mild anterior thinning (breast attenuation artifact), no region of scar/ischemia  . Cardiac catheterization  05/10/2005    normal coronaries, normal LV systolic function and EF (Dr. Jackie Plum)   History   Social History  . Marital Status: Divorced    Spouse Name: N/A    Number of Children: 1  . Years of Education: 12    Occupational History  . disabled     Social History Main Topics  . Smoking status: Current Every Day Smoker -- 0.25 packs/day for 7 years    Types: Cigarettes  . Smokeless tobacco: Never Used     Comment: "started back but off now for 3 months" (08/18/13)  . Alcohol Use: No     Comment:    . Drug Use: No   Current Outpatient Prescriptions on File Prior to Visit  Medication Sig Dispense Refill  . aspirin EC 81 MG tablet Take 81 mg by mouth daily.    . Cholecalciferol (D 5000) 5000 UNITS capsule Take 5,000 Units by mouth daily.    . cloNIDine (CATAPRES) 0.3 MG tablet TAKE 1 TABLET BY MOUTH EVERY EIGHT HOURS. ONCE AT 8AM, 4PM, AND 12 MIDNIGHT. 90 tablet 3  . diclofenac sodium (VOLTAREN) 1 % GEL Apply 2 g topically daily as needed (Pain).    [name] dicyclomine (BENTYL) 10 MG capsule Take 10 mg by mouth 3 (three) times daily before meals.     [name] glucose blood (ACCU-CHEK AVIVA PLUS) test strip Use to test blood sugar 4 times daily as instructed. Dx code: E10.65 400 each 3  . hydrochlorothiazide (HYDRODIURIL) 25 MG tablet Take 1 tablet (25 mg total) by mouth daily. 30 tablet 3  . HYDROmorphone (DILAUDID) 4 MG tablet Take 4 mg by mouth 3 (three) times daily as needed. For pain    . hydroxychloroquine (PLAQUENIL) 200 MG tablet Take 200 mg by mouth daily.    . insulin aspart (NOVOLOG FLEXPEN) 100 UNIT/ML FlexPen Inject 14-20 Units into the skin 3 (three) times daily with meals. (Patient taking differently: Inject 16 Units into the skin 3 (three) times daily with meals. ) 45 mL 1  . Insulin Glargine (LANTUS) 100 UNIT/ML Solostar Pen Inject 50 Units into  the skin daily at 10 pm. 45 mL 1  . Insulin Pen Needle (UNIFINE PENTIPS) 31G X 6 MM MISC Use to inject insulin 4 times daily as directed. 390 each 3  .      . lamoTRIgine (LAMICTAL) 100 MG tablet Take 1.5 tablets (150 mg total) by mouth every morning. 45 tablet 5  . levothyroxine (LEVOXYL) 50 MCG tablet Take 50 mcg by mouth every morning.     [name]  LORazepam (ATIVAN) 0.5 MG tablet Take 0.5 mg by mouth at bedtime.     [name] losartan (COZAAR) 100 MG tablet TAKE 1 TABLET BY MOUTH ONCE DAILY. 30 tablet 3  . methocarbamol (ROBAXIN) 500 MG tablet Take 500 mg by mouth every morning.     . metoprolol (LOPRESSOR) 50 MG tablet TAKE 1 TABLET BY MOUTH TWICE DAILY. 60 tablet 5  . mirtazapine (REMERON) 30 MG tablet Take 30 mg by mouth at bedtime.     . mometasone-formoterol (DULERA) 100-5 MCG/ACT AERO Inhale 2 puffs into the lungs 2 (two) times daily. 1 Inhaler 0  . montelukast (SINGULAIR) 10 MG tablet TAKE ONE TABLET BY MOUTH ONCE DAILY. 30 tablet 2  . Multiple Vitamins-Minerals (ONE-A-DAY 50 PLUS PO) Take 1 tablet by mouth daily.    . pregabalin (LYRICA) 75 MG capsule Take 75 mg by mouth 2 (two) times daily.     . RABEprazole (ACIPHEX) 20 MG tablet TAKE 1 TABLET BY MOUTH TWICE DAILY. (Patient not taking: Reported on 03/03/2014) 60 tablet 5  . RABEprazole (ACIPHEX) 20 MG tablet Take 20 mg by mouth every morning.    . RESTASIS 0.05 % ophthalmic emulsion Place 1 drop into both eyes 2 (two) times daily.     . rosuvastatin (CRESTOR) 5 MG tablet TAKE 1 TABLET BY MOUTH AT BEDTIME. (Patient taking differently: Take 5 mg by mouth at bedtime. TAKE 1 TABLET BY MOUTH AT BEDTIME.) 30 tablet 3  . sertraline (ZOLOFT) 100 MG tablet Take 100 mg by mouth every morning.     [name] spironolactone (ALDACTONE) 25 MG tablet TAKE ONE TABLET BY MOUTH DAILY. 30 tablet 3  . terbinafine (LAMISIL) 250 MG tablet Take 1 tablet by mouth daily.    [name] thioridazine (MELLARIL) 10 MG tablet Take 20 mg by mouth at bedtime.     . traZODone (DESYREL) 50 MG tablet Take 50 mg by mouth at bedtime.     [name] ZETIA 10 MG tablet TAKE ONE TABLET BY MOUTH ONCE DAILY. 30 tablet 2   No current facility-administered medications on file prior to visit.   Allergies  Allergen Reactions  . Cephalexin Hives  . Iron Nausea And Vomiting  . Milk-Related Compounds Other (See Comments)    Doesn't agree with stomach.    . Penicillins Hives  . Phenazopyridine Hcl Other (See Comments)    [place]     Family History  Problem Relation Age of Onset  . Heart attack Mother     HTN  . Pneumonia Father   . Kidney failure Father   . Pancreatic cancer Sister   . Diabetes Brother   . Hypertension Brother   . Colon cancer Neg Hx   . Anesthesia problems Neg Hx   . Hypotension Neg Hx   . Malignant hyperthermia Neg Hx   . Pseudochol deficiency Neg Hx   . Stroke Maternal Grandmother   . Heart attack Maternal Grandfather   . Cancer Sister   . Hypertension Son    PE: BP 122/62 mmHg  Pulse 79  Temp(Src)  98 F (36.7 C) (Oral)  Resp 12  Wt 166 lb (75.297 kg)  SpO2 94% Body mass index is 33.51 kg/(m^2).  Wt Readings from Last 3 Encounters:  06/22/14 166 lb (75.297 kg)  06/02/14 174 lb 2.6 oz (79 kg)  05/05/14 164 lb (74.39 kg)   Constitutional: overweight, in NAD but again appears somnolent Eyes: surgical pupils, EOMI, no exophthalmos ENT: moist mucous membranes, no thyromegaly, large thyroid nodule felt in isthmus, no cervical lymphadenopathy Cardiovascular: RRR, No MRG Respiratory: CTA B Gastrointestinal: abdomen soft, NT, ND, BS+ Musculoskeletal: no deformities, strength intact in all 4 Skin: moist, warm, no rashes Neurological: no tremor with outstretched hands, DTR normal in all 4  ASSESSMENT: 1. DM2, insulin-dependent, uncontrolled, without complications - gastroparesis - cerebrovasc. Ds -  H/o stroke - PN  2. Hypothyroidism  3. MNG - 01/20/2014 Thyroid U/S- felt one nodule enlarging >> new U/S: stable appearance of the nodules, except a small new nodule in isthmus: 1.2 cm  Right thyroid lobe  Measurements: 4.4 cm x 1.2 cm x 1.7 cm. Multiple right-sided nodules identified.  Each right-sided nodule demonstrates increased echogenicity, with the superior measuring 7 mm -8 mm, most inferior measuring 9 mm - 10 mm. A small, 3 mm focus of calcium with posterior shadowing is evident. There  is also a mid nodule measuring 7 mm, which is echogenic.  Left thyroid lobe  Measurements: 5.1 cm x 2.1 cm x 2.3 cm. Dominant lesion at the inferior pole of left thyroid is again evident, which has been previously biopsied (10/29/2012). This nodule measures 1.8 cm x 1.7 cm x 2.5 cm. (Previous 1.4 cm x 2.1 cm x 1.4 cm)  Isthmus  Thickness: 4 mm-5 mm. Isthmic nodule again noted, previously biopsied (10/29/2012). Currently this nodule measures 2.2 cm x 1.4 cm x 1.8 cm. (previous measurement 2.2 cm x 1.1 cm x 1.7 cm).  There is a new nodule identified within the isthmus with heterogeneously hyperechoic characteristics. This nodule measures 12 mm x 5.4 mm x 7.2 mm.  Lymphadenopathy None visualized.  FNA (11/05/2013) x2: benign  PLAN:  1. Patient with long-standing, uncontrolled diabetes, on basal-bolus regimen, with improved sugars after resolving enteritis. Still some high sugars before lunch and dinner >> will increase a little the NovoLog with these meals: Patient Instructions  Please continue: - Toujeo 50 units at bedtime Please increase: - Novolog:  20 units before a regular meal  22 units before a large meal   Please return in 3 month with your sugar log.   - continue checking sugars at different times of the day - check 3-4 times a day, rotating checks - advised for yearly eye exams >> she is UTD - check A1c at next visit - Return to clinic in 3 mo with sugar log   2. Hypothyroidism - recent TFTs normal - we discussed how to take the thyroid hormone every day, with water, >30 minutes before breakfast, separated by >4 hours from acid reflux medications, calcium, iron, multivitamins. We have moved Aciphex from am to later in the day. - continue LT4 50 mcg daily  3. MNG - previously had 2 normal Bx's (2014) - will need a new U/S in 1 year from previous.

## 2014-06-22 NOTE — Patient Instructions (Signed)
Please continue: - Toujeo 50 units at bedtime Please increase: - Novolog:  20 units before a regular meal  22 units before a large meal   Please return in 3 month with your sugar log.

## 2014-06-23 ENCOUNTER — Encounter (HOSPITAL_COMMUNITY)
Admission: RE | Admit: 2014-06-23 | Discharge: 2014-06-23 | Disposition: A | Discharge status: Home / Self Care | Payer: Commercial Managed Care - HMO | Source: Ambulatory Visit | Attending: Gastroenterology | Admitting: Gastroenterology

## 2014-06-23 ENCOUNTER — Encounter (HOSPITAL_COMMUNITY): Payer: Self-pay

## 2014-06-23 DIAGNOSIS — R112 Nausea with vomiting, unspecified: Secondary | ICD-10-CM | POA: Insufficient documentation

## 2014-06-23 DIAGNOSIS — K3184 Gastroparesis: Secondary | ICD-10-CM | POA: Diagnosis not present

## 2014-06-23 DIAGNOSIS — R140 Abdominal distension (gaseous): Secondary | ICD-10-CM | POA: Diagnosis not present

## 2014-06-23 MED ORDER — TECHNETIUM TC 99M SULFUR COLLOID
2.0000 | Freq: Once | INTRAVENOUS | Status: AC | PRN
  Administered 2014-06-23: 2 via ORAL

## 2014-06-24 ENCOUNTER — Ambulatory Visit: Payer: Medicare (Managed Care) | Admitting: Family Medicine

## 2014-06-25 ENCOUNTER — Encounter: Payer: Self-pay | Admitting: Family Medicine

## 2014-06-25 ENCOUNTER — Ambulatory Visit (INDEPENDENT_AMBULATORY_CARE_PROVIDER_SITE_OTHER): Payer: Commercial Managed Care - HMO | Admitting: Family Medicine

## 2014-06-25 VITALS — BP 158/80 | HR 72 | Resp 16 | Ht 59.0 in | Wt 167.0 lb

## 2014-06-25 DIAGNOSIS — IMO0001 Reserved for inherently not codable concepts without codable children: Secondary | ICD-10-CM

## 2014-06-25 DIAGNOSIS — E785 Hyperlipidemia, unspecified: Secondary | ICD-10-CM | POA: Diagnosis not present

## 2014-06-25 DIAGNOSIS — R1084 Generalized abdominal pain: Secondary | ICD-10-CM

## 2014-06-25 DIAGNOSIS — E1065 Type 1 diabetes mellitus with hyperglycemia: Secondary | ICD-10-CM

## 2014-06-25 DIAGNOSIS — M5440 Lumbago with sciatica, unspecified side: Secondary | ICD-10-CM

## 2014-06-25 DIAGNOSIS — I10 Essential (primary) hypertension: Secondary | ICD-10-CM | POA: Diagnosis not present

## 2014-06-25 DIAGNOSIS — F3170 Bipolar disorder, currently in remission, most recent episode unspecified: Secondary | ICD-10-CM

## 2014-06-25 DIAGNOSIS — E1165 Type 2 diabetes mellitus with hyperglycemia: Secondary | ICD-10-CM

## 2014-06-25 DIAGNOSIS — Z794 Long term (current) use of insulin: Secondary | ICD-10-CM

## 2014-06-25 NOTE — Patient Instructions (Addendum)
Annual wellness as before  We will ask your home health nurse to call in with weekly blood pressure for next 4 weeks, I may change BP med before you get back in May   CONGRATS on stopping smoking  Thankful blood sugar improving  Continue to work with [employer] as they try to help you with your bowel concerns  Labs BEFORE you get back   [name]

## 2014-06-26 DIAGNOSIS — R030 Elevated blood-pressure reading, without diagnosis of hypertension: Secondary | ICD-10-CM | POA: Diagnosis not present

## 2014-06-28 NOTE — Assessment & Plan Note (Signed)
Elevated at this visit, pt on multiple meds, review of recent BP results shows wide variation, will get input from H/H before any med changes DASH diet and commitment to daily physical activity for a minimum of 30 minutes discussed and encouraged, as a part of hypertension management. The importance of attaining a healthy weight is also discussed.  BP/Weight 06/25/2014 06/22/2014 06/07/2014 06/02/2014 05/05/2014 04/22/2014 6/46/8032  Systolic BP 122 482 500 - 370 488 891  Diastolic BP 80 62 60 - 70 70 58  Wt. (Lbs) 167 166 - 174.16 164 171 169.6  BMI 33.71 33.51 - 35.16 33.11 34.52 34.24       '

## 2014-06-28 NOTE — Assessment & Plan Note (Signed)
Controlled on current mediation through pain clinic, trying to reduce medication needs, which is good

## 2014-06-28 NOTE — Assessment & Plan Note (Signed)
Ongoing complaint , currently being evaluated and treated  by GI

## 2014-06-28 NOTE — Assessment & Plan Note (Signed)
Currently stable has upcoming appt with new Provider

## 2014-06-28 NOTE — Progress Notes (Signed)
Subjective:    Patient ID: [name], female    DOB: 05-21-1950, 64 y.o.   MRN: [id number]  HPI Pt in for f/u recent hospitalization when a dmitted with abdominal pain , chronic nausea, bloating and emesis, also describes "straining " at stool, with abnormal formed stool, all of this is being evaluated on ongoing process as out patient by local GI Doc. Feels better about her diabetes, still not as well controlled as she would like but better Denies polyuria, polydipsia, blurred vision , or hypoglycemic episodes. Chronic pain is unchanged,a and she is trying to reduce her dependence on pain medicaton Has actually quit nicotine and feels really good about this as she should. Has upcoming appt with Behavioral health , and is looking forward to changing to new Provider, not suicidal or homicidal currently, had  Been with Dr Theda Sers for years and never seemed to have been able to 'connect" with his successor   Review of Systems See HPI Denies recent fever or chills. Denies sinus pressure, nasal congestion, ear pain or sore throat. Denies chest congestion, productive cough or wheezing. Denies chest pains, palpitations and leg swelling .   Denies dysuria, frequency, hesitancy or incontinence. Chronic joint pain, swelling and limitation in mobility. . Chronic  depression, anxiety or insomnia. Denies skin break down or rash.        Objective:   Physical Exam BP 158/80 mmHg  Pulse 72  Resp 16  Ht 4\' 11"  (1.499 m)  Wt 167 lb (75.751 kg)  BMI 33.71 kg/m2  SpO2 98% Patient alert and oriented and in no cardiopulmonary distress.  HEENT: No facial asymmetry, EOMI,   oropharynx pink and moist.  Neck decreased ROM, no JVD, no mass.  Chest: Clear to auscultation bilaterally.  CVS: S1, S2 no murmurs, no S3.Regular rate.  ABD: Soft obese, no tender to superficial touch.   Ext: No edema  MS: decreased  ROM spine, shoulders, hips and knees.  Skin: Intact, no ulcerations or rash  noted.  Psych: Good eye contact, normal affect. Memory intact not anxious or depressed appearing.  CNS: CN 2-12 intact, power,  normal throughout.no focal deficits noted.        Assessment & Plan:  Essential hypertension Elevated at this visit, pt on multiple meds, review of recent BP results shows wide variation, will get input from H/H before any med changes DASH diet and commitment to daily physical activity for a minimum of 30 minutes discussed and encouraged, as a part of hypertension management. The importance of attaining a healthy weight is also discussed.  BP/Weight 06/25/2014 06/22/2014 06/07/2014 06/02/2014 05/05/2014 04/22/2014 09/01/2977  Systolic BP 892 119 417 - 408 144 818  Diastolic BP 80 62 60 - 70 70 58  Wt. (Lbs) 167 166 - 174.16 164 171 169.6  BMI 33.71 33.51 - 35.16 33.11 34.52 34.24       '   Hyperlipidemia Hyperlipidemia:Low fat diet discussed and encouraged. Updated lab neede, elevated triglycerides   Lipid Panel  Lab Results  Component Value Date   CHOL 171 12/19/2013   HDL 45 12/19/2013   LDLCALC 78 12/19/2013   TRIG 240* 12/19/2013   CHOLHDL 3.8 12/19/2013         Backache Controlled on current mediation through pain clinic, trying to reduce medication needs, which is good   Generalized abdominal pain Ongoing complaint , currently being evaluated and treated  by GI   Bipolar disorder Currently stable has upcoming appt with new Provider  Diabetes mellitus, insulin dependent (IDDM), uncontrolled Patient advised to reduce carb and sweets, commit to regular physical activity, take meds as prescribed, test blood as directed, and attempt to lose weight, to improve blood sugar control. Patient educated about the importance of limiting  Carbohydrate intake , the need to commit to daily physical activity for a minimum of 30 minutes , and to commit weight loss.  Diabetic Labs Latest Ref Rng 06/06/2014 06/05/2014 06/04/2014 06/03/2014 06/02/2014    HbA1c 4.8 - 5.6 % - - - - -  Microalbumin 0.00 - 1.89 mg/dL - - - - -  Micro/Creat Ratio 0.0 - 30.0 mg/g - - - - -  Chol 0 - 200 mg/dL - - - - -  HDL >39 mg/dL - - - - -  Calc LDL 0 - 99 mg/dL - - - - -  Triglycerides <150 mg/dL - - - - -  Creatinine 0.50 - 1.10 mg/dL 1.17(H) 0.98 0.94 1.01 0.91   BP/Weight 06/25/2014 06/22/2014 06/07/2014 06/02/2014 05/05/2014 04/22/2014 4/80/1655  Systolic BP 374 827 078 - 675 449 201  Diastolic BP 80 62 60 - 70 70 58  Wt. (Lbs) 167 166 - 174.16 164 171 169.6  BMI 33.71 33.51 - 35.16 33.11 34.52 34.24   Foot/eye exam completion dates Latest Ref Rng 10/03/2013 08/14/2013  Eye Exam No Retinopathy No Retinopathy -  Foot exam Order - - -  Foot Form Completion - - Done

## 2014-06-28 NOTE — Assessment & Plan Note (Addendum)
Improved, managed by endo Patient advised to reduce carb and sweets, commit to regular physical activity, take meds as prescribed, test blood as directed, and attempt to lose weight, to improve blood sugar control. Patient educated about the importance of limiting  Carbohydrate intake , the need to commit to daily physical activity for a minimum of 30 minutes , and to commit weight loss.  Diabetic Labs Latest Ref Rng 06/06/2014 06/05/2014 06/04/2014 06/03/2014 06/02/2014  HbA1c 4.8 - 5.6 % - - - - -  Microalbumin 0.00 - 1.89 mg/dL - - - - -  Micro/Creat Ratio 0.0 - 30.0 mg/g - - - - -  Chol 0 - 200 mg/dL - - - - -  HDL >39 mg/dL - - - - -  Calc LDL 0 - 99 mg/dL - - - - -  Triglycerides <150 mg/dL - - - - -  Creatinine 0.50 - 1.10 mg/dL 1.17(H) 0.98 0.94 1.01 0.91   BP/Weight 06/25/2014 06/22/2014 06/07/2014 06/02/2014 05/05/2014 04/22/2014 0/86/5784  Systolic BP 696 295 284 - 132 440 102  Diastolic BP 80 62 60 - 70 70 58  Wt. (Lbs) 167 166 - 174.16 164 171 169.6  BMI 33.71 33.51 - 35.16 33.11 34.52 34.24   Foot/eye exam completion dates Latest Ref Rng 10/03/2013 08/14/2013  Eye Exam No Retinopathy No Retinopathy -  Foot exam Order - - -  Foot Form Completion - - Done

## 2014-06-28 NOTE — Assessment & Plan Note (Signed)
Hyperlipidemia:Low fat diet discussed and encouraged. Updated lab neede, elevated triglycerides   Lipid Panel  Lab Results  Component Value Date   CHOL 171 12/19/2013   HDL 45 12/19/2013   LDLCALC 78 12/19/2013   TRIG 240* 12/19/2013   CHOLHDL 3.8 12/19/2013

## 2014-06-29 ENCOUNTER — Telehealth: Payer: Self-pay | Admitting: Family Medicine

## 2014-06-29 DIAGNOSIS — E119 Type 2 diabetes mellitus without complications: Secondary | ICD-10-CM | POA: Diagnosis not present

## 2014-06-29 DIAGNOSIS — I10 Essential (primary) hypertension: Secondary | ICD-10-CM | POA: Diagnosis not present

## 2014-06-29 DIAGNOSIS — Z794 Long term (current) use of insulin: Secondary | ICD-10-CM | POA: Diagnosis not present

## 2014-06-29 DIAGNOSIS — F319 Bipolar disorder, unspecified: Secondary | ICD-10-CM | POA: Diagnosis not present

## 2014-06-29 DIAGNOSIS — F209 Schizophrenia, unspecified: Secondary | ICD-10-CM | POA: Diagnosis not present

## 2014-06-30 ENCOUNTER — Ambulatory Visit: Payer: Commercial Managed Care - HMO | Admitting: Internal Medicine

## 2014-06-30 ENCOUNTER — Ambulatory Visit (INDEPENDENT_AMBULATORY_CARE_PROVIDER_SITE_OTHER): Payer: Medicare HMO | Admitting: Psychiatry

## 2014-06-30 ENCOUNTER — Encounter (HOSPITAL_COMMUNITY): Payer: Self-pay | Admitting: Psychiatry

## 2014-06-30 VITALS — BP 155/98 | HR 72 | Ht 59.0 in | Wt 167.0 lb

## 2014-06-30 DIAGNOSIS — F332 Major depressive disorder, recurrent severe without psychotic features: Secondary | ICD-10-CM

## 2014-06-30 DIAGNOSIS — F331 Major depressive disorder, recurrent, moderate: Secondary | ICD-10-CM

## 2014-06-30 MED ORDER — SERTRALINE HCL 100 MG PO TABS: 200.0000 mg | ORAL_TABLET | Freq: Every day | ORAL | Status: DC

## 2014-06-30 MED ORDER — LORAZEPAM 0.5 MG PO TABS: 0.5000 mg | ORAL_TABLET | Freq: Every day | ORAL | Status: DC

## 2014-06-30 MED ORDER — TRAZODONE HCL 150 MG PO TABS: 150.0000 mg | ORAL_TABLET | Freq: Every day | ORAL | Status: DC

## 2014-06-30 MED ORDER — RISPERIDONE 0.5 MG PO TABS: 0.5000 mg | ORAL_TABLET | Freq: Every day | ORAL | Status: DC
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
## 2014-06-30 NOTE — Patient Instructions (Signed)
Stop mellaril, remeron Increase Zoloft Start Risperdal Continue clonazepam

## 2014-07-02 ENCOUNTER — Encounter: Payer: Self-pay | Admitting: Neurology

## 2014-07-02 ENCOUNTER — Ambulatory Visit (INDEPENDENT_AMBULATORY_CARE_PROVIDER_SITE_OTHER): Payer: Commercial Managed Care - HMO | Admitting: Neurology

## 2014-07-02 VITALS — BP 130/50 | HR 68 | Resp 16 | Ht 59.0 in | Wt 167.1 lb

## 2014-07-02 DIAGNOSIS — G40209 Localization-related (focal) (partial) symptomatic epilepsy and epileptic syndromes with complex partial seizures, not intractable, without status epilepticus: Secondary | ICD-10-CM

## 2014-07-02 DIAGNOSIS — IMO0002 Reserved for concepts with insufficient information to code with codable children: Secondary | ICD-10-CM

## 2014-07-02 DIAGNOSIS — R413 Other amnesia: Secondary | ICD-10-CM | POA: Diagnosis not present

## 2014-07-02 DIAGNOSIS — G43009 Migraine without aura, not intractable, without status migrainosus: Secondary | ICD-10-CM | POA: Insufficient documentation

## 2014-07-02 DIAGNOSIS — Z9889 Other specified postprocedural states: Secondary | ICD-10-CM

## 2014-07-02 DIAGNOSIS — E119 Type 2 diabetes mellitus without complications: Secondary | ICD-10-CM | POA: Diagnosis not present

## 2014-07-02 DIAGNOSIS — I69359 Hemiplegia and hemiparesis following cerebral infarction affecting unspecified side: Secondary | ICD-10-CM

## 2014-07-02 DIAGNOSIS — G8191 Hemiplegia, unspecified affecting right dominant side: Secondary | ICD-10-CM

## 2014-07-02 DIAGNOSIS — M150 Primary generalized (osteo)arthritis: Secondary | ICD-10-CM | POA: Diagnosis not present

## 2014-07-02 DIAGNOSIS — I639 Cerebral infarction, unspecified: Secondary | ICD-10-CM | POA: Diagnosis not present

## 2014-07-02 DIAGNOSIS — Z8603 Personal history of neoplasm of uncertain behavior: Secondary | ICD-10-CM

## 2014-07-02 DIAGNOSIS — G4733 Obstructive sleep apnea (adult) (pediatric): Secondary | ICD-10-CM | POA: Diagnosis not present

## 2014-07-02 NOTE — Patient Instructions (Signed)
1.  Continue Lamictal 150mg  twice daily 2.  Monitor memory issues and whether it is getting worse.  Next visit, if it is getting worse, we will refer you for cognitive testing. 3.  Follow up in 6 months

## 2014-07-02 NOTE — Telephone Encounter (Signed)
Pt called requesting that [name] to call her back. Please advise

## 2014-07-02 NOTE — Progress Notes (Signed)
NEUROLOGY FOLLOW UP OFFICE NOTE  Jazmynn Pho [place] [id number]  HISTORY OF PRESENT ILLNESS: [name] is a 64 year old right-handed woman with hypertension, stroke, type II diabetes, , seizure disorder, mononeuritis of leg, OSA, thyroid nodule, cervical disc disease, depression, anxiety, chronic pain, chronic back pain with gait instability and falls, polypharmacy, and history of stroke with residual left-sided weakness and meningioma resection in September 2013 who follows up for chronic headaches, seizures, and meningioma.  Records, including recent hospitalization, and labs reviewed.  UPDATE: She was admitted to the hospital last month for enteritis.  She is doing better but still notes abdominal discomfort.  Since last visit, Zoloft was switched to risperidone.  She reports an episode last week where she had a difficult time concentrating.  Her son said she wasn't making sense.  For example, he asked her where she wanted to go to eat and she answered "next year."  She has since quit smoking.  HEADACHES: She has longstanding history of multifactorial chronic headaches.  They are described as from the back of her head and top of the front of her head.  It is a pounding pain, 8/10.  There is some nausea, but no photophobia or phonophobia.  She has blurred vision, but that is constant.  They usually last all day and occur 2 to 3 times a week.  She usually takes Tylenol for it, but she also is taking oxycodone for left arm pain.  Prior medications included lamotrigine, gabapentin, Lyrica, metoprolol, amitriptyline, and imipramine.  She is not a candidate for triptans due to history of stroke.  Given the complexity of the headaches, such as medication-overuse, and use of opioids daiIy recommended management by a headache specialist.  She saw Dr. Domingo Cocking at the [facility] on 04/07/13.  She received round of trigger point injections.  Dr. Domingo Cocking recommended Botox but she still does not want to  pursue this.  She has had reduced frequency of headaches, however, to 1-2 times a month.  SEIZURES: She has history of seizures for many years.  Her last seizure was about 3 years ago.  She described them as staring spells.  She is taking Lamictal 150mg  twice daily.  Her EEG from 08/08/12 revealed right temporal epileptiform discharges.    Update:  No seizures.  Lamictal level from 12/08/13 was 6.7.  MENINGIOMA: She had a known history of meningioma.  She presented to the ED in August 2013 with sudden onset numbness of the feet and left hand weakness and numbness.  MRI of brain noted enlargement of previous planum sphenoidale meningioma compared to prior study in 2009, with vasogenic edema.  In September 2013, she underwent a bicoronal craniotomy and gross total resection.  Repeat MRI of brain with and without contrast performed on 05/01/12 revealed postop resection of meningioma of the planum sphenoidale with small amount of residual enhancing tissue along the planum sphenoidale (5 x 95mm), postop blood in the bilateral frontal lobes and bilateral encephalomalacia of the gyrus rectus bilaterally.  She had a repeat MRI of the brain with and without contrast performed on 06/18/13, which revealed no recurrent tumor.  STROKE: She has a history of stroke going back several years ago.  Semiology is unclear but reports left-sided issues.  She was started on Plavix, but last visit, she was switched over to ASA 81mg .  LDL from 08/06/13 98.  Hgb A1c from 10/28/13 was 8.5.   MEMORY: She also reports problems with memory, word-finding difficulties, and problems with  name recognition of acquaintances of people at church.  This has been going on for over a year.  MMSE score from 12/08/13 was 27/30 with minor inconsistencies   Update:  Memory is still a problem.  OTHER HISTORY: She reports that at times, when she speaks, she doesn't make sense.  Her words or intelligible but content is confusing.  It may be  accompanied by slurred speech.  Both herself and others notice it.  It has been going on for a while but she notices it more over the past month or two.  EEG performed on 12/23/13 was normal.  She has history of cervical disc disease.  Recent MRI of cervical spine revealed shallow central disc protrusion at C3-4, degenerative disc disease and facet disease at C5-6 with a central disc protrusion, and shallow left paracentral and medial foraminal disc protrusion at C7-T1.  Dr. Sherwood Gambler, her neurosurgeon, did not think she was a surgical candidate.  She also has history of OSA.  Currently on CPAP and is compliant.  She still has hypersomnolence.  She had a NPSG on 06/29/13, which revealed mild OSA, AHI 8.4/ hr, REM only 5.3% of TST.  MSLT performed on 06/30/13 pathological sleepiness, with mean latency 0.8%, No REM naps. The results were compromised because the night before, she had taken trazodone, mellaril, Lyrica, and mirtazapine.  She says somebody told her she has narcolepsy, but the sleep study does not suggest this.  She is on multiple medications for many chronic conditions as listed above  PAST MEDICAL HISTORY: Past Medical History  Diagnosis Date  . Bipolar disorder   . CVA (cerebral infarction)   . Pancreatitis 2006    due to Depakote with normal EUS   . Osteoporosis   . Chronic back pain   . Diabetes mellitus   . Trigger finger   . Anxiety disorder   . Hypertension   . Migraines     chronic headaches  . Diverticulosis     TCS 9/08 by Dr. Delfin Edis for diarrhea . Bx for micro scopic colitis negative.   . Schatzki's ring     non critical / EGD with ED 8/2011with RMR  . S/P colonoscopy [id number], 2011    left-sided diverticula, hx of simple adenomas . 2011, random bx negative for microscopic colitis  . Glaucoma   . Allergic rhinitis   . Hypothyroidism     thyroid goiter  . Anemia   . Blood transfusion   . GERD (gastroesophageal reflux disease)   . Stroke     left sided  weakness  . Seizures     unknown etiology-on meds-last seizure was 3 yerars ago  . Anxiety   . Depression   . Metabolic encephalopathy 0/25/4270  . Sleep apnea     on CPAP  . Arthritis   . Gum symptoms     infection on antibiotic  . Chronic neck pain   . Mononeuritis lower limb   . Frequent falls     MEDICATIONS: Current Outpatient Prescriptions on File Prior to Visit  Medication Sig Dispense Refill  . acidophilus (RISAQUAD) CAPS capsule Take 2 capsules by mouth daily. 60 capsule 1  . aspirin EC 81 MG tablet Take 81 mg by mouth daily.    . Cholecalciferol (D 5000) 5000 UNITS capsule Take 5,000 Units by mouth daily.    . cloNIDine (CATAPRES) 0.3 MG tablet TAKE 1 TABLET BY MOUTH EVERY EIGHT HOURS. ONCE AT 8AM, 4PM, AND 12 MIDNIGHT. 90 tablet 3  .  CRESTOR 5 MG tablet TAKE 1 TABLET BY MOUTH AT BEDTIME. 30 tablet 2  . diclofenac sodium (VOLTAREN) 1 % GEL Apply 2 g topically daily as needed (Pain).    [name] dicyclomine (BENTYL) 10 MG capsule Take 1 capsule (10 mg total) by mouth 3 (three) times daily before meals. 120 capsule 3  . docusate sodium (COLACE) 100 MG capsule Take 2 capsules (200 mg total) by mouth at bedtime. 30 capsule 0  . fluticasone (FLONASE) 50 MCG/ACT nasal spray Place 2 sprays into both nostrils daily.    [name] glucose blood (ACCU-CHEK AVIVA PLUS) test strip Use to test blood sugar 4 times daily as instructed. Dx code: E10.65 400 each 3  . HYDROmorphone (DILAUDID) 4 MG tablet Take 4 mg by mouth 3 (three) times daily as needed for moderate pain.     . hydroxychloroquine (PLAQUENIL) 200 MG tablet Take 200 mg by mouth daily.    . insulin aspart (NOVOLOG FLEXPEN) 100 UNIT/ML FlexPen Inject 20-22 Units into the skin 3 (three) times daily with meals. 45 mL 1  . Insulin Glargine (TOUJEO SOLOSTAR) 300 UNIT/ML SOPN Inject 50 Units into the skin at bedtime. 12 pen 2  . Insulin Pen Needle (UNIFINE PENTIPS) 31G X 6 MM MISC Use to inject insulin 4 times daily as directed. 390 each 3  .  lamoTRIgine (LAMICTAL) 100 MG tablet Take 1.5 tablets (150 mg total) by mouth every morning. 45 tablet 5  . levothyroxine (LEVOXYL) 50 MCG tablet Take 1 tablet (50 mcg total) by mouth every morning. 30 tablet 3  . LORazepam (ATIVAN) 0.5 MG tablet Take 1 tablet (0.5 mg total) by mouth at bedtime. 30 tablet 2  . losartan (COZAAR) 100 MG tablet TAKE 1 TABLET BY MOUTH ONCE DAILY. 30 tablet 2  . meclizine (ANTIVERT) 12.5 MG tablet Take 1 tablet (12.5 mg total) by mouth 3 (three) times daily as needed. For nausea 30 tablet 0  . methocarbamol (ROBAXIN) 500 MG tablet Take 500 mg by mouth every morning.     . metoprolol (LOPRESSOR) 50 MG tablet TAKE 1 TABLET BY MOUTH TWICE DAILY. 60 tablet 5  . mometasone-formoterol (DULERA) 100-5 MCG/ACT AERO Inhale 2 puffs into the lungs 2 (two) times daily. 1 Inhaler 0  . montelukast (SINGULAIR) 10 MG tablet TAKE ONE TABLET BY MOUTH ONCE DAILY. 30 tablet 2  . Multiple Vitamins-Minerals (ONE-A-DAY 50 PLUS PO) Take 1 tablet by mouth daily.    [name] nystatin (MYCOSTATIN) powder Apply topically 4 (four) times daily. (Patient taking differently: Apply 1 g topically daily. After each bath.) 30 g 0  . ondansetron (ZOFRAN) 4 MG tablet Take 1 tablet (4 mg total) by mouth every 6 (six) hours as needed for nausea. 20 tablet 0  . pregabalin (LYRICA) 75 MG capsule Take 75 mg by mouth 2 (two) times daily.     . RABEprazole (ACIPHEX) 20 MG tablet TAKE 1 TABLET BY MOUTH TWICE DAILY. 60 tablet 5  . RESTASIS 0.05 % ophthalmic emulsion Place 1 drop into both eyes 2 (two) times daily.     . risperiDONE (RISPERDAL) 0.5 MG tablet Take 1 tablet (0.5 mg total) by mouth at bedtime. 30 tablet 2  . spironolactone (ALDACTONE) 25 MG tablet TAKE ONE TABLET BY MOUTH DAILY. 30 tablet 2  . traZODone (DESYREL) 150 MG tablet Take 1 tablet (150 mg total) by mouth at bedtime. 30 tablet 2  . ZETIA 10 MG tablet TAKE ONE TABLET BY MOUTH ONCE DAILY. 30 tablet 2  . sertraline (ZOLOFT)  100 MG tablet Take 2 tablets  (200 mg total) by mouth daily. (Patient not taking: Reported on 07/02/2014) 60 tablet 2   No current facility-administered medications on file prior to visit.    ALLERGIES: Allergies  Allergen Reactions  . Cephalexin Hives  . Iron Nausea And Vomiting  . Milk-Related Compounds Other (See Comments)    Doesn't agree with stomach.   . Penicillins Hives  . Phenazopyridine Hcl Other (See Comments)    [place]      FAMILY HISTORY: Family History  Problem Relation Age of Onset  . Heart attack Mother     HTN  . Pneumonia Father   . Kidney failure Father   . Pancreatic cancer Sister   . Diabetes Brother   . Hypertension Brother   . Colon cancer Neg Hx   . Anesthesia problems Neg Hx   . Hypotension Neg Hx   . Malignant hyperthermia Neg Hx   . Pseudochol deficiency Neg Hx   . Stroke Maternal Grandmother   . Heart attack Maternal Grandfather   . Cancer Sister   . Hypertension Son   . Alcohol abuse Maternal Uncle     SOCIAL HISTORY: History   Social History  . Marital Status: Divorced    Spouse Name: N/A  . Number of Children: 1  . Years of Education: 12   Occupational History  . disabled     Social History Main Topics  . Smoking status: Former Smoker -- 0.25 packs/day for 7 years    Types: Cigarettes  . Smokeless tobacco: Never Used     Comment: quit for 5 weeks now   . Alcohol Use: No     Comment:    . Drug Use: No  . Sexual Activity: No   Other Topics Concern  . Not on file   Social History Narrative    REVIEW OF SYSTEMS: Constitutional: No fevers, chills, or sweats, no generalized fatigue, change in appetite Eyes: No visual changes, double vision, eye pain Ear, nose and throat: No hearing loss, ear pain, nasal congestion, sore throat Cardiovascular: No chest pain, palpitations Respiratory:  No shortness of breath at rest or with exertion, wheezes GastrointestinaI: No nausea, vomiting, diarrhea, abdominal pain, fecal incontinence Genitourinary:  No dysuria,  urinary retention or frequency Musculoskeletal:  No neck pain, back pain Integumentary: No rash, pruritus, skin lesions Neurological: as above Psychiatric: No depression, insomnia, anxiety Endocrine: No palpitations, fatigue, diaphoresis, mood swings, change in appetite, change in weight, increased thirst Hematologic/Lymphatic:  No anemia, purpura, petechiae. Allergic/Immunologic: no itchy/runny eyes, nasal congestion, recent allergic reactions, rashes  PHYSICAL EXAM: Filed Vitals:   07/02/14 0917  BP: 130/50  Pulse: 68  Resp: 16   General: No acute distress Head:  Normocephalic/atraumatic Eyes:  Fundoscopic exam unremarkable without vessel changes, exudates, hemorrhages or papilledema. Neck: supple, no paraspinal tenderness, full range of motion Heart:  Regular rate and rhythm Lungs:  Clear to auscultation bilaterally Back: No paraspinal tenderness Neurological Exam:  alert and oriented to person, place, and time. Attention span and concentration intact, recent and remote memory intact, fund of knowledge intact.  Speech slowed and dysarthric, language intact.  MMSE - Mini Mental State Exam 07/02/2014 12/08/2013  Orientation to time 5 5  Orientation to Place 5 5  Registration 3 2  Attention/ Calculation 3 5  Recall 3 2  Language- name 2 objects 2 2  Language- repeat 1 1  Language- follow 3 step command 3 3  Language- read & follow direction 1 1  Write a sentence 0 1  Copy design 0 0  Total score 26 27  When writing a sentence, she misspelled "daughter" as "duaghter" CN II-XII intact. Fundi not visualized. Bulk and tone normal. 5-/5 left hip flexion, otherwise 5/5.  Reduced pinprick sensation to left arm and leg. DTR 1+ throughout, except absent at ankles. Finger to nose slowed but no dysmetria. Wide-based gait.   IMPRESSION: Seizure disorder, stable History of meningioma resection Left-sided hemiplegia post stroke Memory deficits, likely related to hypersomnolence and  polypharmacy Migraines stable  PLAN: Continue Lamictal 150mg  daily Try to reduce medication list Smoking cessation ASA for secondary stroke prevention Monitor memory.  If worsens, consider neuropsych testing Follow up in 6 months  Metta Clines, DO  CC: Tula Nakayama, MD

## 2014-07-03 NOTE — Telephone Encounter (Signed)
[name] aware

## 2014-07-03 NOTE — Telephone Encounter (Signed)
Please Advise pt, no change in BP med based on reading obtained, thanks Let Nicki and pt know no need to continue weekly BP call in unless nurse Sherron Flemings) has a concern about it, thanks!

## 2014-07-03 NOTE — Telephone Encounter (Signed)
[name] called in with her weekly BP reading---104/56 HR 60

## 2014-07-06 ENCOUNTER — Ambulatory Visit: Payer: Self-pay | Admitting: Internal Medicine

## 2014-07-07 ENCOUNTER — Telehealth: Payer: Self-pay | Admitting: Family Medicine

## 2014-07-07 DIAGNOSIS — M79609 Pain in unspecified limb: Secondary | ICD-10-CM | POA: Diagnosis not present

## 2014-07-07 NOTE — Telephone Encounter (Signed)
BEEN HAVING BITES AND SWELLING UP AND THEY ARE ON LEGS NO ITCHY JUST BOTHERING HER AFRAID SHE MAY HIT IT  PLEASE CALL BACK

## 2014-07-08 ENCOUNTER — Telehealth: Payer: Self-pay | Admitting: Family Medicine

## 2014-07-09 DIAGNOSIS — Z794 Long term (current) use of insulin: Secondary | ICD-10-CM | POA: Diagnosis not present

## 2014-07-09 DIAGNOSIS — M5441 Lumbago with sciatica, right side: Secondary | ICD-10-CM | POA: Diagnosis not present

## 2014-07-09 DIAGNOSIS — I10 Essential (primary) hypertension: Secondary | ICD-10-CM | POA: Diagnosis not present

## 2014-07-09 DIAGNOSIS — M961 Postlaminectomy syndrome, not elsewhere classified: Secondary | ICD-10-CM | POA: Diagnosis not present

## 2014-07-09 DIAGNOSIS — E119 Type 2 diabetes mellitus without complications: Secondary | ICD-10-CM | POA: Diagnosis not present

## 2014-07-09 DIAGNOSIS — F209 Schizophrenia, unspecified: Secondary | ICD-10-CM | POA: Diagnosis not present

## 2014-07-09 DIAGNOSIS — F319 Bipolar disorder, unspecified: Secondary | ICD-10-CM | POA: Diagnosis not present

## 2014-07-09 NOTE — Telephone Encounter (Signed)
Tell [name] no need to call in BP anymore thanks I sent this sammessage last weeek, hopefully she gets it this time!

## 2014-07-09 NOTE — Telephone Encounter (Signed)
Concern addressed

## 2014-07-09 NOTE — Telephone Encounter (Signed)
[name] called to report her weekly BP it was 114/54.  No other concerns voiced to nurse

## 2014-07-11 DIAGNOSIS — G4733 Obstructive sleep apnea (adult) (pediatric): Secondary | ICD-10-CM | POA: Diagnosis not present

## 2014-07-13 ENCOUNTER — Ambulatory Visit (HOSPITAL_COMMUNITY)
Admission: RE | Admit: 2014-07-13 | Discharge: 2014-07-13 | Disposition: A | Discharge status: Home / Self Care | Payer: Commercial Managed Care - HMO | Source: Ambulatory Visit | Attending: Neurosurgery | Admitting: Neurosurgery

## 2014-07-13 DIAGNOSIS — Z9889 Other specified postprocedural states: Secondary | ICD-10-CM | POA: Insufficient documentation

## 2014-07-13 DIAGNOSIS — F319 Bipolar disorder, unspecified: Secondary | ICD-10-CM | POA: Diagnosis not present

## 2014-07-13 DIAGNOSIS — I10 Essential (primary) hypertension: Secondary | ICD-10-CM | POA: Diagnosis not present

## 2014-07-13 DIAGNOSIS — Z794 Long term (current) use of insulin: Secondary | ICD-10-CM | POA: Diagnosis not present

## 2014-07-13 DIAGNOSIS — F209 Schizophrenia, unspecified: Secondary | ICD-10-CM | POA: Diagnosis not present

## 2014-07-13 DIAGNOSIS — D329 Benign neoplasm of meninges, unspecified: Secondary | ICD-10-CM | POA: Diagnosis not present

## 2014-07-13 DIAGNOSIS — D320 Benign neoplasm of cerebral meninges: Secondary | ICD-10-CM | POA: Diagnosis not present

## 2014-07-13 DIAGNOSIS — E119 Type 2 diabetes mellitus without complications: Secondary | ICD-10-CM | POA: Diagnosis not present

## 2014-07-13 MED ORDER — GADOBENATE DIMEGLUMINE 529 MG/ML IV SOLN
15.0000 mL | Freq: Once | INTRAVENOUS | Status: AC | PRN
  Administered 2014-07-13: 15 mL via INTRAVENOUS

## 2014-07-15 ENCOUNTER — Other Ambulatory Visit: Payer: Self-pay | Admitting: Gastroenterology

## 2014-07-15 DIAGNOSIS — I10 Essential (primary) hypertension: Secondary | ICD-10-CM | POA: Diagnosis not present

## 2014-07-15 DIAGNOSIS — M810 Age-related osteoporosis without current pathological fracture: Secondary | ICD-10-CM | POA: Diagnosis not present

## 2014-07-15 DIAGNOSIS — E785 Hyperlipidemia, unspecified: Secondary | ICD-10-CM | POA: Diagnosis not present

## 2014-07-15 DIAGNOSIS — D509 Iron deficiency anemia, unspecified: Secondary | ICD-10-CM | POA: Diagnosis not present

## 2014-07-16 ENCOUNTER — Ambulatory Visit: Payer: Self-pay

## 2014-07-16 LAB — CBC WITH DIFFERENTIAL/PLATELET
BASOS ABS: 0 10*3/uL (ref 0.0–0.1)
Basophils Relative: 0 % (ref 0–1)
Eosinophils Absolute: 0.2 10*3/uL (ref 0.0–0.7)
Eosinophils Relative: 3 % (ref 0–5)
HCT: 35.3 % — ABNORMAL LOW (ref 36.0–46.0)
HEMOGLOBIN: 10.5 g/dL — AB (ref 12.0–15.0)
LYMPHS PCT: 31 % (ref 12–46)
Lymphs Abs: 1.9 10*3/uL (ref 0.7–4.0)
MCH: 22 pg — AB (ref 26.0–34.0)
MCHC: 29.7 g/dL — ABNORMAL LOW (ref 30.0–36.0)
MCV: 73.8 fL — ABNORMAL LOW (ref 78.0–100.0)
MONO ABS: 0.5 10*3/uL (ref 0.1–1.0)
MPV: 8.9 fL (ref 8.6–12.4)
Monocytes Relative: 9 % (ref 3–12)
NEUTROS ABS: 3.4 10*3/uL (ref 1.7–7.7)
Neutrophils Relative %: 57 % (ref 43–77)
Platelets: 215 10*3/uL (ref 150–400)
RBC: 4.78 MIL/uL (ref 3.87–5.11)
RDW: 14.6 % (ref 11.5–15.5)
WBC: 6 10*3/uL (ref 4.0–10.5)

## 2014-07-16 LAB — CBC
HCT: 35.1 % — ABNORMAL LOW (ref 36.0–46.0)
Hemoglobin: 10.4 g/dL — ABNORMAL LOW (ref 12.0–15.0)
MCH: 21.8 pg — AB (ref 26.0–34.0)
MCHC: 29.6 g/dL — ABNORMAL LOW (ref 30.0–36.0)
MCV: 73.6 fL — AB (ref 78.0–100.0)
MPV: 9 fL (ref 8.6–12.4)
PLATELETS: 218 10*3/uL (ref 150–400)
RBC: 4.77 MIL/uL (ref 3.87–5.11)
RDW: 14.5 % (ref 11.5–15.5)
WBC: 5.9 10*3/uL (ref 4.0–10.5)

## 2014-07-16 LAB — IRON AND TIBC
%SAT: 13 % — AB (ref 20–55)
Iron: 44 ug/dL (ref 42–145)
TIBC: 343 ug/dL (ref 250–470)
UIBC: 299 ug/dL (ref 125–400)

## 2014-07-16 LAB — COMPLETE METABOLIC PANEL WITH GFR
ALT: 18 U/L (ref 0–35)
AST: 18 U/L (ref 0–37)
Albumin: 4 g/dL (ref 3.5–5.2)
Alkaline Phosphatase: 122 U/L — ABNORMAL HIGH (ref 39–117)
BILIRUBIN TOTAL: 0.3 mg/dL (ref 0.2–1.2)
BUN: 23 mg/dL (ref 6–23)
CO2: 31 mEq/L (ref 19–32)
Calcium: 9.3 mg/dL (ref 8.4–10.5)
Chloride: 107 mEq/L (ref 96–112)
Creat: 0.9 mg/dL (ref 0.50–1.10)
GFR, EST NON AFRICAN AMERICAN: 68 mL/min
GFR, Est African American: 78 mL/min
Glucose, Bld: 171 mg/dL — ABNORMAL HIGH (ref 70–99)
POTASSIUM: 4.6 meq/L (ref 3.5–5.3)
Sodium: 142 mEq/L (ref 135–145)
Total Protein: 7.1 g/dL (ref 6.0–8.3)

## 2014-07-16 LAB — LIPID PANEL
Cholesterol: 121 mg/dL (ref 0–200)
HDL: 39 mg/dL — ABNORMAL LOW (ref >=46)
LDL CALC: 57 mg/dL (ref 0–99)
Total CHOL/HDL Ratio: 3.1 Ratio
Triglycerides: 123 mg/dL (ref <150)
VLDL: 25 mg/dL (ref 0–40)

## 2014-07-16 LAB — VITAMIN D 25 HYDROXY (VIT D DEFICIENCY, FRACTURES): VIT D 25 HYDROXY: 50 ng/mL (ref 30–100)

## 2014-07-16 LAB — FERRITIN: Ferritin: 76 ng/mL (ref 10–291)

## 2014-07-17 ENCOUNTER — Ambulatory Visit (INDEPENDENT_AMBULATORY_CARE_PROVIDER_SITE_OTHER): Payer: Commercial Managed Care - HMO | Admitting: Internal Medicine

## 2014-07-17 ENCOUNTER — Encounter (INDEPENDENT_AMBULATORY_CARE_PROVIDER_SITE_OTHER): Payer: Self-pay

## 2014-07-17 ENCOUNTER — Other Ambulatory Visit: Payer: Self-pay

## 2014-07-17 ENCOUNTER — Encounter: Payer: Self-pay | Admitting: Internal Medicine

## 2014-07-17 VITALS — BP 155/79 | HR 93 | Temp 97.9°F | Ht 62.0 in | Wt 169.2 lb

## 2014-07-17 DIAGNOSIS — K3184 Gastroparesis: Secondary | ICD-10-CM

## 2014-07-17 DIAGNOSIS — R1013 Epigastric pain: Secondary | ICD-10-CM

## 2014-07-17 DIAGNOSIS — R1314 Dysphagia, pharyngoesophageal phase: Secondary | ICD-10-CM

## 2014-07-17 DIAGNOSIS — K219 Gastro-esophageal reflux disease without esophagitis: Secondary | ICD-10-CM | POA: Diagnosis not present

## 2014-07-17 DIAGNOSIS — R159 Full incontinence of feces: Secondary | ICD-10-CM | POA: Diagnosis not present

## 2014-07-17 LAB — LIPASE: LIPASE: 16 U/L (ref 0–75)

## 2014-07-17 NOTE — Patient Instructions (Signed)
Serum lipase today  Barium pill esophagram to further evaluate swallowing difficulties  Gastroparesis diet.  Strive for better control of blood sugars. Continue AcipHex 20 mg daily  Begin Benefiber 2 teaspoons twice daily  Follow-up office visit with [name] in 4 weeks.

## 2014-07-17 NOTE — Progress Notes (Signed)
Primary Care Physician:  Tula Nakayama, MD Primary Gastroenterologist:  Dr. Gala Romney  Pre-Procedure History & Physical: HPI:  [name] is a 64 y.o. female here for evaluation of epigastric pain and dysphagia. Last seen in December 2015 here. Bowel function normalized since stopping Bentyl although patient does relate intermittent small episodes of fecal incontinence. Has not been bleeding. Stool Hemoccult negative from last visit. Chronic microcytic anemia. Iron studies recently negative for iron deficiency.EGD, colonoscopy and capsule study within the past 2 years completed without significant pathology being found. Reports intermittent esophageal dysphagia to solid food. Has history of Schatzki's ring-felt to be noncritical previously. Distant history of pancreatitis felt to be drug related. Seen in the ED in March of this year for acute onset abdominal pain. Was found to have some mild changes consistent with enteritis on CT. Reflux symptoms well controlled on AcipHex 20 mg daily recent nausea and vomiting; delayed gastric emptying GES.. Patient reports her last hemoglobin A1c was in the 8-1/2 range. She denies melena, hematochezia or hematemesis. No taste since craniotomy.  Takes oral narcotics on a regular basis. Past Medical History  Diagnosis Date  . Bipolar disorder   . CVA (cerebral infarction)   . Pancreatitis 2006    due to Depakote with normal EUS   . Osteoporosis   . Chronic back pain   . Diabetes mellitus   . Trigger finger   . Anxiety disorder   . Hypertension   . Migraines     chronic headaches  . Diverticulosis     TCS 9/08 by Dr. Delfin Edis for diarrhea . Bx for micro scopic colitis negative.   . Schatzki's ring     non critical / EGD with ED 8/2011with RMR  . S/P colonoscopy 0932,6712, 2011    left-sided diverticula, hx of simple adenomas . 2011, random bx negative for microscopic colitis  . Glaucoma   . Allergic rhinitis   . Hypothyroidism     thyroid  goiter  . Anemia   . Blood transfusion   . GERD (gastroesophageal reflux disease)   . Stroke     left sided weakness  . Seizures     unknown etiology-on meds-last seizure was 3 yerars ago  . Anxiety   . Depression   . Metabolic encephalopathy 4/58/0998  . Sleep apnea     on CPAP  . Arthritis   . Gum symptoms     infection on antibiotic  . Chronic neck pain   . Mononeuritis lower limb   . Frequent falls     Past Surgical History  Procedure Laterality Date  . Abdominal hysterectomy  1978  . Cholecystectomy  1984  . Ovarian cyst removal    . Carpal tunnel release Left 07/22/04    Dr. Aline Brochure  . Breast reduction surgery  1994  . Cataract extraction Bilateral   . Biopsy thyroid  2009  . Surgical excision of 3 tumors from right thigh and right buttock  and left upper thigh  2010  . Back surgery  July 2012  . Spine surgery  09/29/2010    Dr. Rolena Infante  . Maloney dilation  12/29/2010    RMR;  . Esophagogastroduodenoscopy  12/29/2010    Zayne Draheim-Retained food in the esophagus and stomach, small hiatal hernia, status post Maloney dilation of the esophagus  . Craniotomy  11/23/2011    Procedure: CRANIOTOMY TUMOR EXCISION;  Surgeon: Hosie Spangle, MD;  Location: [facility];  Service: Neurosurgery;  Laterality: N/A;  Craniotomy  for tumor resection  . Brain surgery  11/2011    resection of meningioma  . Colonoscopy N/A 09/25/2012    UQJ:FHLKTGY diverticulosis.  colonic polyp-removed : tubular adenoma  . Esophagogastroduodenoscopy N/A 09/25/2012    BWL:SLHTDSKA atonic baggy esophagus status post Maloney dilation 16 F. Hiatal hernia  . Givens capsule study N/A 01/15/2013    NORMAL.   . Bacterial overgrowth test N/A 05/05/2013    Procedure: BACTERIAL OVERGROWTH TEST;  Surgeon: Daneil Dolin, MD;  Location: [facility];  Service: Endoscopy;  Laterality: N/A;  7:30  . Transthoracic echocardiogram  2010    EF 60-65%, mild conc LVH, grade 1 diastolic dysfunction; mildly calcified MV  annulus with mildly thickened leaflets, mildly calcified MR annulus  . Nm myocar perf wall motion  2006    "relavtiely normal" persantine, mild anterior thinning (breast attenuation artifact), no region of scar/ischemia  . Cardiac catheterization  05/10/2005    normal coronaries, normal LV systolic function and EF (Dr. Jackie Plum)    Prior to Admission medications   Medication Sig Start Date End Date Taking? Authorizing Provider  acidophilus (RISAQUAD) CAPS capsule Take 2 capsules by mouth daily. 06/07/14  Yes Kathie Dike, MD  aspirin EC 81 MG tablet Take 81 mg by mouth daily.   Yes Historical Provider, MD  Cholecalciferol (D 5000) 5000 UNITS capsule Take 5,000 Units by mouth daily.   Yes Historical Provider, MD  cloNIDine (CATAPRES) 0.3 MG tablet TAKE 1 TABLET BY MOUTH EVERY EIGHT HOURS. ONCE AT 8AM, [place], AND 12 MIDNIGHT. 05/12/14  Yes [name], MD  CRESTOR 5 MG tablet TAKE 1 TABLET BY MOUTH AT BEDTIME. 05/25/14  Yes [name], MD  diclofenac sodium (VOLTAREN) 1 % GEL Apply 2 g topically daily as needed (Pain).   Yes Historical Provider, MD  dicyclomine (BENTYL) 10 MG capsule Take 1 capsule (10 mg total) by mouth 3 (three) times daily before meals. 06/07/14  Yes Kathie Dike, MD  docusate sodium (COLACE) 100 MG capsule Take 2 capsules (200 mg total) by mouth at bedtime. 06/07/14  Yes Kathie Dike, MD  fluticasone (FLONASE) 50 MCG/ACT nasal spray Place 2 sprays into both nostrils daily.   Yes Historical Provider, MD  glucose blood (ACCU-CHEK AVIVA PLUS) test strip Use to test blood sugar 4 times daily as instructed. Dx code: E10.65 01/22/14  Yes Philemon Kingdom, MD  HYDROmorphone (DILAUDID) 4 MG tablet Take 4 mg by mouth 3 (three) times daily as needed for moderate pain.  02/20/14  Yes Historical Provider, MD  hydroxychloroquine (PLAQUENIL) 200 MG tablet Take 200 mg by mouth daily.   Yes Historical Provider, MD  insulin aspart (NOVOLOG FLEXPEN) 100 UNIT/ML FlexPen Inject 20-22  Units into the skin 3 (three) times daily with meals. 06/22/14  Yes Philemon Kingdom, MD  Insulin Glargine (TOUJEO SOLOSTAR) 300 UNIT/ML SOPN Inject 50 Units into the skin at bedtime. 06/22/14  Yes Philemon Kingdom, MD  Insulin Pen Needle (UNIFINE PENTIPS) 31G X 6 MM MISC Use to inject insulin 4 times daily as directed. 01/22/14  Yes Philemon Kingdom, MD  lamoTRIgine (LAMICTAL) 100 MG tablet Take 1.5 tablets (150 mg total) by mouth every morning. 02/23/14  Yes Pieter Partridge, DO  levothyroxine (LEVOXYL) 50 MCG tablet Take 1 tablet (50 mcg total) by mouth every morning. 05/05/14  Yes Philemon Kingdom, MD  LORazepam (ATIVAN) 0.5 MG tablet Take 1 tablet (0.5 mg total) by mouth at bedtime. 06/30/14  Yes Cloria Spring, MD  losartan (COZAAR) 100  MG tablet TAKE 1 TABLET BY MOUTH ONCE DAILY. 06/22/14  Yes [name], MD  meclizine (ANTIVERT) 12.5 MG tablet Take 1 tablet (12.5 mg total) by mouth 3 (three) times daily as needed. For nausea 03/11/14  Yes [name], MD  methocarbamol (ROBAXIN) 500 MG tablet Take 500 mg by mouth every morning.    Yes Historical Provider, MD  metoprolol (LOPRESSOR) 50 MG tablet TAKE 1 TABLET BY MOUTH TWICE DAILY. 03/05/14  Yes Pixie Casino, MD  mometasone-formoterol (DULERA) 100-5 MCG/ACT AERO Inhale 2 puffs into the lungs 2 (two) times daily. 10/30/13  Yes Deneise Lever, MD  montelukast (SINGULAIR) 10 MG tablet TAKE ONE TABLET BY MOUTH ONCE DAILY. 05/25/14  Yes [name], MD  Multiple Vitamins-Minerals (ONE-A-DAY 50 PLUS PO) Take 1 tablet by mouth daily.   Yes Historical Provider, MD  nystatin (MYCOSTATIN) powder Apply topically 4 (four) times daily. Patient taking differently: Apply 1 g topically daily. After each bath. 03/11/14  Yes [name], MD  ondansetron (ZOFRAN) 4 MG tablet Take 1 tablet (4 mg total) by mouth every 6 (six) hours as needed for nausea. 06/07/14  Yes Kathie Dike, MD  pregabalin (LYRICA) 75 MG capsule Take 75 mg by mouth 2  (two) times daily.  10/26/11  Yes Carole Civil, MD  RABEprazole (ACIPHEX) 20 MG tablet TAKE 1 TABLET BY MOUTH TWICE DAILY. 01/12/14  Yes Mahala Menghini, PA-C  RESTASIS 0.05 % ophthalmic emulsion Place 1 drop into both eyes 2 (two) times daily.  02/04/14  Yes Historical Provider, MD  risperiDONE (RISPERDAL) 0.5 MG tablet Take 1 tablet (0.5 mg total) by mouth at bedtime. 06/30/14  Yes Cloria Spring, MD  sertraline (ZOLOFT) 100 MG tablet Take 2 tablets (200 mg total) by mouth daily. 06/30/14  Yes Cloria Spring, MD  spironolactone (ALDACTONE) 25 MG tablet TAKE ONE TABLET BY MOUTH DAILY. 05/25/14  Yes [name], MD  traZODone (DESYREL) 150 MG tablet Take 1 tablet (150 mg total) by mouth at bedtime. 06/30/14  Yes Cloria Spring, MD  ZETIA 10 MG tablet TAKE ONE TABLET BY MOUTH ONCE DAILY. 05/25/14  Yes [name], MD    Allergies as of 07/17/2014 - Review Complete 07/17/2014  Allergen Reaction Noted  . Cephalexin Hives   . Iron Nausea And Vomiting 11/15/2012  . Milk-related compounds Other (See Comments) 07/29/2008  . Penicillins Hives   . Phenazopyridine hcl Other (See Comments) 06/19/2007    Family History  Problem Relation Age of Onset  . Heart attack Mother     HTN  . Pneumonia Father   . Kidney failure Father   . Pancreatic cancer Sister   . Diabetes Brother   . Hypertension Brother   . Colon cancer Neg Hx   . Anesthesia problems Neg Hx   . Hypotension Neg Hx   . Malignant hyperthermia Neg Hx   . Pseudochol deficiency Neg Hx   . Stroke Maternal Grandmother   . Heart attack Maternal Grandfather   . Cancer Sister   . Hypertension Son   . Alcohol abuse Maternal Uncle     History   Social History  . Marital Status: Divorced    Spouse Name: N/A  . Number of Children: 1  . Years of Education: 12   Occupational History  . disabled     Social History Main Topics  . Smoking status: Former Smoker -- 0.25 packs/day for 7 years    Types: Cigarettes  .  Smokeless tobacco: Never Used     Comment: quit for 5 weeks now   . Alcohol Use: No     Comment:    . Drug Use: No  . Sexual Activity: No   Other Topics Concern  . Not on file   Social History Narrative    Review of Systems: See HPI, otherwise negative ROS  Physical Exam: BP 155/79 mmHg  Pulse 93  Temp(Src) 97.9 F (36.6 C) (Oral)  Ht 5\' 2"  (1.575 m)  Wt 169 lb 3.2 oz (76.749 kg)  BMI 30.94 kg/m2 General:  Alert conversant, pleasant and cooperative in NAD Skin:  Intact without significant lesions or rashes. Eyes:  Sclera clear, no icterus.   Conjunctiva pink. Ears:  Normal auditory acuity. Nose:  No deformity, discharge,  or lesions. Mouth:  No deformity or lesions. Neck:  Supple; no masses or thyromegaly. No significant cervical adenopathy. Lungs:  Clear throughout to auscultation.   No wheezes, crackles, or rhonchi. No acute distress. Heart:  Regular rate and rhythm; no murmurs, clicks, rubs,  or gallops. Abdomen: somewhat obese. Positive bowel sounds. No succussion splash. Soft with some epigastric/periumbilical tenderness to palpation. No appreciable mass or hepatosplenomegaly. Pulses:  Normal pulses noted. Extremities:  Without clubbing or edema. Rectal exam:  No external lesions. Moderately good sphincter tone scant brown stool in rectal vault. Hemoccult negative.   Impression:  Pleasant  64 year old lady with multiple GI issues in the setting of multiple non-GI comorbidities. Patient has had intermittent nausea, vomiting and epigastric/periumbilical pain. Intermittent fecal incontinence. GERD symptoms well controlled with AcipHex. Poorly controlled diabetes mellitus. Delay in gastric emptying recently. Enteritis on CT. I reviewed the CT with Dr. Thornton Papas. He states mesenteric haziness was minimal. Minimal thickening of the small bowel. Nonspecific. Pancreas looked okay. Some of her symptoms are likely related to diabetic and narcotic-induced gastroparesis. I'm hesitant to  indite gastroparesis as the sole cause of her abdominal pain, however. In reviewing the CT scan it was noted that her mesenteric vasculature looked good. Distant history of pancreatitis. No recent serum lipase. She's had her entire luminal GI tract evaluated within the past 2 years without significant findings.  Reglan would be a poor choice given her pre-existing psychiatric illness ad her ongoing pharmacotherapy.     Recommendations:  Serum lipase today  Barium pill esophagram to further evaluate swallowing difficulties  Gastroparesis diet.  Strive for better control of blood sugars. Continue AcipHex 20 mg daily  Begin Benefiber 2 teaspoons twice daily  Follow-up office visit with [name] in 4 weeks.          Notice: This dictation was prepared with Dragon dictation along with smaller phrase technology. Any transcriptional errors that result from this process are unintentional and may not be corrected upon review.

## 2014-07-19 DIAGNOSIS — M5440 Lumbago with sciatica, unspecified side: Secondary | ICD-10-CM | POA: Diagnosis not present

## 2014-07-20 DIAGNOSIS — F319 Bipolar disorder, unspecified: Secondary | ICD-10-CM | POA: Diagnosis not present

## 2014-07-20 DIAGNOSIS — Z794 Long term (current) use of insulin: Secondary | ICD-10-CM | POA: Diagnosis not present

## 2014-07-20 DIAGNOSIS — D329 Benign neoplasm of meninges, unspecified: Secondary | ICD-10-CM | POA: Diagnosis not present

## 2014-07-20 DIAGNOSIS — E119 Type 2 diabetes mellitus without complications: Secondary | ICD-10-CM | POA: Diagnosis not present

## 2014-07-20 DIAGNOSIS — I10 Essential (primary) hypertension: Secondary | ICD-10-CM | POA: Diagnosis not present

## 2014-07-20 DIAGNOSIS — F209 Schizophrenia, unspecified: Secondary | ICD-10-CM | POA: Diagnosis not present

## 2014-07-22 IMAGING — MR MR SHOULDER*L* W/O CM
5 series · 40 of 40 positions shown · non-contrast
Comparison: Chest CT 12/16/2012.

CLINICAL DATA: Left shoulder pain with limited range of motion for
3 or 4 months. No acute injury or prior relevant surgery.

EXAM:
MRI OF THE LEFT SHOULDER WITHOUT CONTRAST
TECHNIQUE: Multiplanar, multisequence MR imaging of the shoulder was performed.
No intravenous contrast was administered.

[Series 4: t2fs axial blade · axial · 3.0mm · 0.47mm/px · z∈[-42,+44]mm · 8 of 25 slices shown]
[im 1/25]
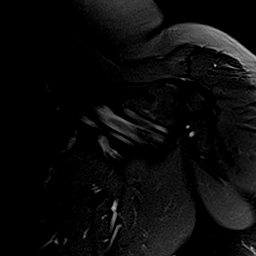
[im 4/25]
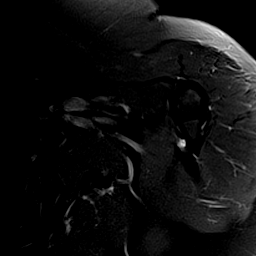
[im 7/25]
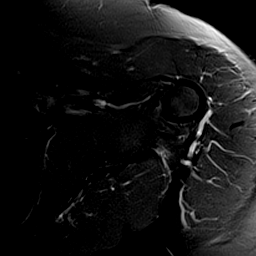
[im 11/25]
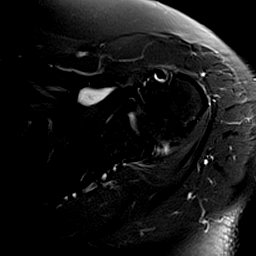
[im 14/25]
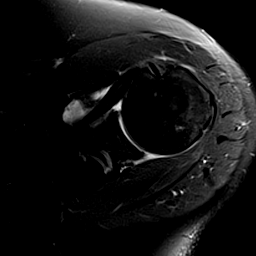
[im 18/25]
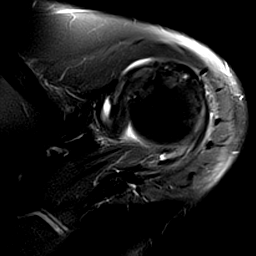
[im 21/25]
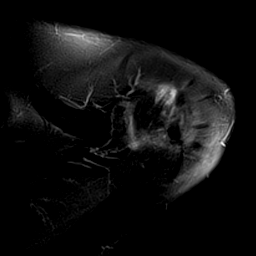
[im 25/25]
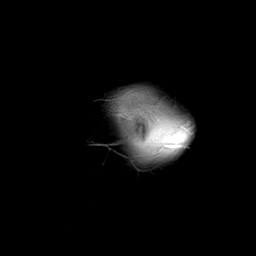

[Series 6: pd_blade_cor · oblique · 3.0mm · 0.51mm/px · 8 of 22 slices shown]
[im 1/22]
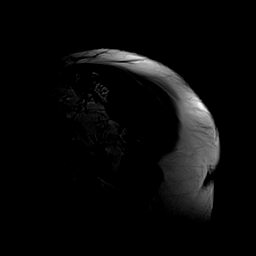
[im 4/22]
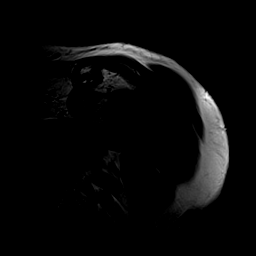
[im 7/22]
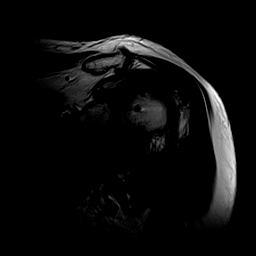
[im 10/22]
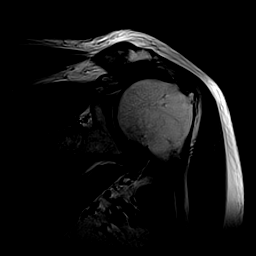
[im 13/22]
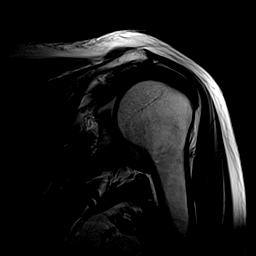
[im 16/22]
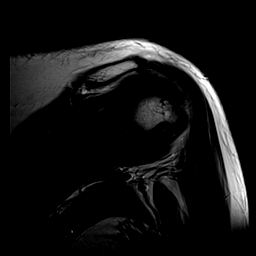
[im 19/22]
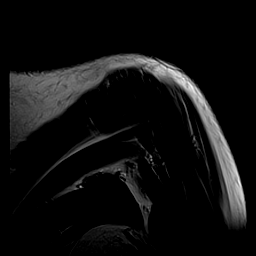
[im 22/22]
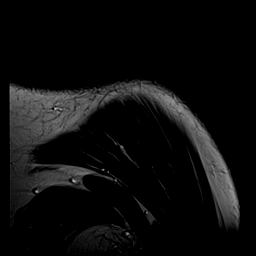

[Series 7: t2fs coronal blade · oblique · 3.0mm · 0.52mm/px · 8 of 22 slices shown]
[im 1/22]
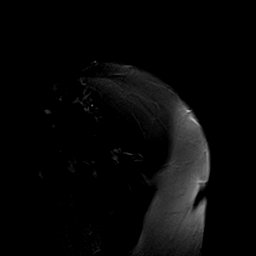
[im 4/22]
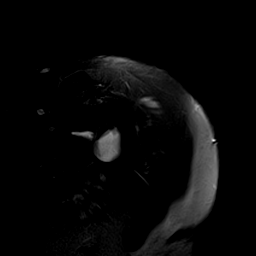
[im 7/22]
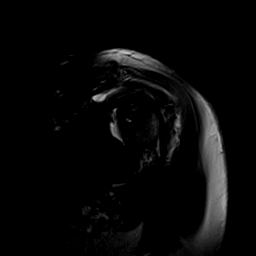
[im 10/22]
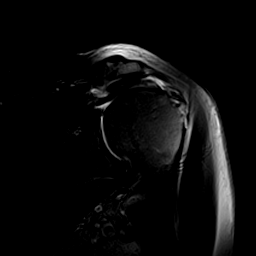
[im 13/22]
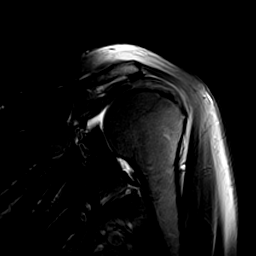
[im 16/22]
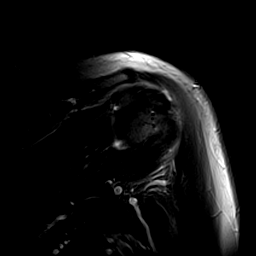
[im 19/22]
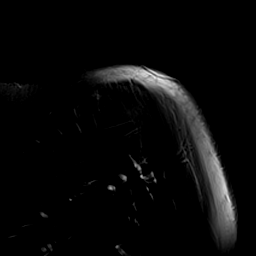
[im 22/22]
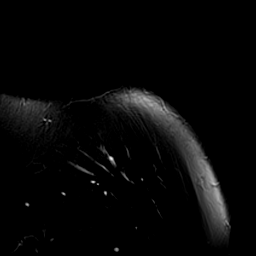

[Series 8: t2fs sagital blade · oblique · 3.0mm · 0.59mm/px · 8 of 24 slices shown]
[im 1/24]
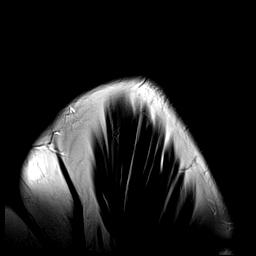
[im 4/24]
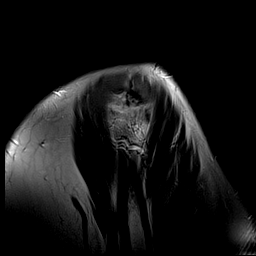
[im 7/24]
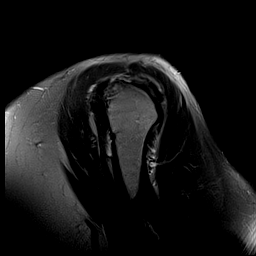
[im 10/24]
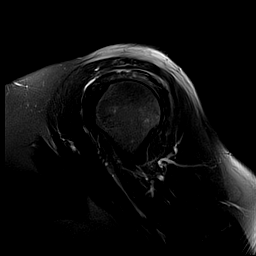
[im 14/24]
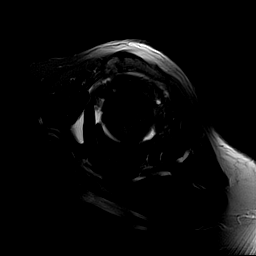
[im 17/24]
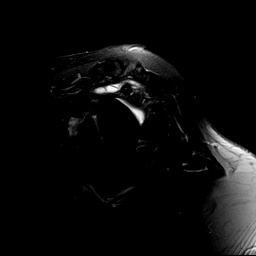
[im 20/24]
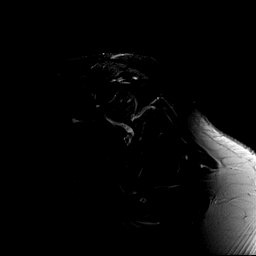
[im 24/24]
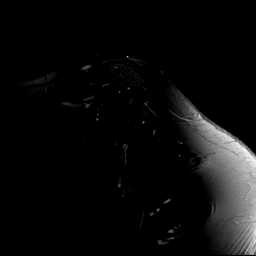

[Series 9: t1_blade_sag · oblique · 3.0mm · 0.57mm/px · 8 of 24 slices shown]
[im 1/24]
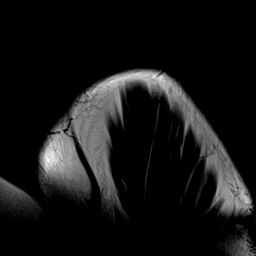
[im 4/24]
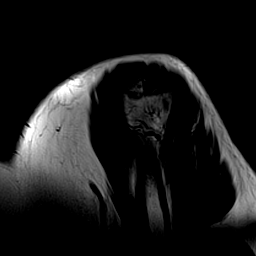
[im 7/24]
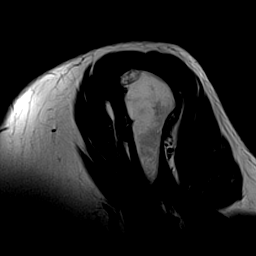
[im 10/24]
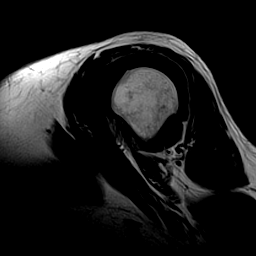
[im 14/24]
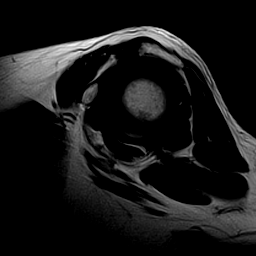
[im 17/24]
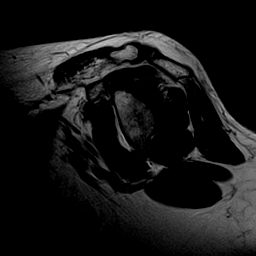
[im 20/24]
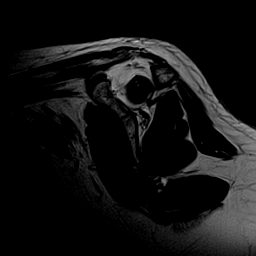
[im 24/24]
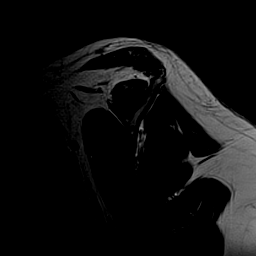

[40 of 40 positions shown; findings below may reference images not displayed]

FINDINGS: Rotator cuff: There is diffuse supraspinatus and infraspinous
tendinosis. There is focal tearing of the supraspinatus tendon in
the critical zone which is likely full-thickness, best seen on the
sagittal images. There is some retraction of the articular surface
fibers on the coronal images. The infraspinous and subscapularis
tendons are intact.

Muscles:  No significant focal muscular atrophy is identified.

Biceps long head: Mild tendinosis of the intra-articular portion. No
evidence of tendon tear or subluxation.

Acromioclavicular Joint: The acromion is type 2. There are moderate
acromioclavicular degenerative changes. There is a small amount
fluid in the subacromial - subdeltoid and the subcoracoid bursa.

Glenohumeral Joint: Small shoulder joint effusion. No significant
glenohumeral arthropathy.

Labrum: There is degeneration of the superior labrum without
discrete labral tear. There is no paralabral cyst.

Bones:  No significant extra-articular osseous findings.
IMPRESSION: 1. Supraspinatus and infraspinous tendinosis with small
full-thickness non insertional tear of the supraspinatus tendon. No
focal muscular atrophy identified.
2. Superior labral degeneration with mild tendinosis of the
intra-articular portion of the biceps tendon.
3. Mild subacromial -subdeltoid and subcoracoid bursitis.

## 2014-07-23 ENCOUNTER — Other Ambulatory Visit (HOSPITAL_COMMUNITY): Payer: Self-pay

## 2014-07-23 DIAGNOSIS — L603 Nail dystrophy: Secondary | ICD-10-CM | POA: Diagnosis not present

## 2014-07-23 DIAGNOSIS — I739 Peripheral vascular disease, unspecified: Secondary | ICD-10-CM | POA: Diagnosis not present

## 2014-07-23 DIAGNOSIS — E1051 Type 1 diabetes mellitus with diabetic peripheral angiopathy without gangrene: Secondary | ICD-10-CM | POA: Diagnosis not present

## 2014-07-23 NOTE — Progress Notes (Signed)
Quick Note:  Labs very stable. Mild anemia, likely anemia of chronic disease. She can continue to have labs periodically checked with PCP as deemed appropriate. ______

## 2014-07-24 ENCOUNTER — Ambulatory Visit: Payer: Self-pay

## 2014-07-24 ENCOUNTER — Ambulatory Visit (HOSPITAL_COMMUNITY): Payer: Self-pay | Admitting: Psychiatry

## 2014-07-27 ENCOUNTER — Ambulatory Visit (INDEPENDENT_AMBULATORY_CARE_PROVIDER_SITE_OTHER): Payer: Commercial Managed Care - HMO | Admitting: Family Medicine

## 2014-07-27 ENCOUNTER — Encounter: Payer: Self-pay | Admitting: Family Medicine

## 2014-07-27 ENCOUNTER — Other Ambulatory Visit: Payer: Self-pay | Admitting: Gastroenterology

## 2014-07-27 VITALS — BP 164/74 | HR 80 | Resp 16 | Ht 61.5 in | Wt 168.4 lb

## 2014-07-27 DIAGNOSIS — I10 Essential (primary) hypertension: Secondary | ICD-10-CM

## 2014-07-27 DIAGNOSIS — Z0000 Encounter for general adult medical examination without abnormal findings: Secondary | ICD-10-CM

## 2014-07-27 MED ORDER — AMLODIPINE BESYLATE 2.5 MG PO TABS: 2.5000 mg | ORAL_TABLET | Freq: Every day | ORAL | Status: DC

## 2014-07-27 NOTE — Assessment & Plan Note (Signed)
Annual exam as documented. Counseling done  re healthy lifestyle involving commitment to 150 minutes exercise per week, heart healthy diet, and attaining healthy weight.The importance of adequate sleep also discussed. Regular seat belt use and home safety, is also discussed. Changes in health habits are decided on by the patient with goals and time frames  set for achieving them. Immunization and cancer screening needs are specifically addressed at this visit.  

## 2014-07-27 NOTE — Progress Notes (Signed)
Subjective:    Patient ID: [name], female    DOB: 1950-09-29, 63 y.o.   MRN: [id number]  HPI Preventive Screening-Counseling & Management   Patient present here today for a Medicare annual wellness visit.   Current Problems (verified)   Medications Prior to Visit Allergies (verified)   PAST HISTORY  Family History (verified)   Social History  Divorced, ex passed away this year. 1 son, currently lives alone    Risk Factors  Current exercise habits:  Tries to walk some when able. WIll try to start going to the [facility] 2 days a week   Dietary issues discussed: Heart healthy diet discussed, tries to limit fried foods and carbs     Cardiac risk factors: DM, mother had MI   Depression Screen  (Note: if answer to either of the following is "Yes", a more complete depression screening is indicated)   Over the past two weeks, have you felt down, depressed or hopeless?  Some  Over the past two weeks, have you felt little interest or pleasure in doing things? Sometimes   Have you lost interest or pleasure in daily life? No  Do you often feel hopeless? No  Do you cry easily over simple problems? Yes   Activities of Daily Living  In your present state of health, do you have any difficulty performing the following activities?  Driving?: doesn't drive Managing money?:  Her niece writes the checks from her account for her bills Feeding yourself?:No Getting from bed to chair?: uses a cane  Climbing a flight of stairs? Cannot climb stairs  Preparing food and eating?:No Bathing or showering?: sometimes her aid helps with her bath and dressing  Getting dressed?: sometimes aid helps  Getting to the toilet?:No Using the toilet?:No Moving around from place to place?: has to use cane for stability   Fall Risk Assessment In the past year have you fallen or had a near fall?:No Are you currently taking any medications that make you dizzy?:No   Hearing Difficulties: No Do you often  ask people to speak up or repeat themselves?:no  Do you experience ringing or noises in your ears?:No Do you have difficulty understanding soft or whispered voices?:No  Cognitive Testing  Alert? Yes Normal Appearance?Yes  Oriented to person? Yes Place? Yes  Time? Yes  Displays appropriate judgment?Yes  Can read the correct time from a watch face? yes Are you having problems remembering things?No  Advanced Directives have been discussed with the patient?Yes, is still working on her living will  , currently full code   List the Names of Other Physician/Practitioners you currently use:  Dr Dirk Dress-  Endo Dr Nelva Bush (pain management) Dr Earl Lites (neuro) Dr Annamaria Boots (pulmo)  Dr Harrington Challenger (psych)  Dr Debara Pickett (cardio)  Dr Rolena Infante (ortho)    Indicate any recent Medical Services you may have received from other than [facility] providers in the past year (date may be approximate).   Assessment:    Annual Wellness Exam   Plan:    .  Medicare Attestation  I have personally reviewed:  The patient's medical and social history  Their use of alcohol, tobacco or illicit drugs  Their current medications and supplements  The patient's functional ability including ADLs,fall risks, home safety risks, cognitive, and hearing and visual impairment  Diet and physical activities  Evidence for depression or mood disorders  The patient's weight, height, BMI, and visual acuity have been recorded in the chart. I have made referrals, counseling, and provided  education to the patient based on review of the above and I have provided the patient with a written personalized care plan for preventive services.      Review of Systems     Objective:   Physical Exam BP 164/74 mmHg  Pulse 80  Resp 16  Ht 5' 1.5" (1.562 m)  Wt 168 lb 6.4 oz (76.386 kg)  BMI 31.31 kg/m2  SpO2 96%  CVS: Heart sounds S1 and S2 are normal, no murmur, no S3 No JVD, no leg edema Chest: clear to ascultation       Assessment & Plan:    Medicare annual wellness visit, subsequent Annual exam as documented. Counseling done  re healthy lifestyle involving commitment to 150 minutes exercise per week, heart healthy diet, and attaining healthy weight.The importance of adequate sleep also discussed. Regular seat belt use and home safety, is also discussed. Changes in health habits are decided on by the patient with goals and time frames  set for achieving them. Immunization and cancer screening needs are specifically addressed at this visit.    Essential hypertension Uncontrolled, amlodipine added,  DASH diet and commitment to daily physical activity for a minimum of 30 minutes discussed and encouraged, as a part of hypertension management. The importance of attaining a healthy weight is also discussed.  BP/Weight 07/27/2014 07/17/2014 07/13/2014 07/02/2014 06/25/2014 06/22/2014 2/50/0370  Systolic BP 488 891 - 694 503 888 280  Diastolic BP 74 79 - 50 80 62 60  Wt. (Lbs) 168.4 169.2 167 167.1 167 166 -  BMI 31.31 30.94 37.46 33.73 33.71 33.51 -  Some encounter information is confidential and restricted. Go to Review Flowsheets activity to see all data.      nurse BP check in 2 weeks

## 2014-07-27 NOTE — Patient Instructions (Addendum)
Annual physical exam in 3 month, call if you need me before  Blood pressure is too high, new additional medication amlodipine is added  To your blood pressure medications that you currently taake    Nurse bP check in 2 weeks

## 2014-07-27 NOTE — Assessment & Plan Note (Addendum)
Uncontrolled, amlodipine added,  DASH diet and commitment to daily physical activity for a minimum of 30 minutes discussed and encouraged, as a part of hypertension management. The importance of attaining a healthy weight is also discussed.  BP/Weight 07/27/2014 07/17/2014 07/13/2014 07/02/2014 06/25/2014 06/22/2014 6/33/3545  Systolic BP 625 638 - 937 [phone]  Diastolic BP 74 79 - 50 80 62 60  Wt. (Lbs) 168.4 169.2 167 167.1 167 166 -  BMI 31.31 30.94 37.46 33.73 33.71 33.51 -  Some encounter information is confidential and restricted. Go to Review Flowsheets activity to see all data.      nurse BP check in 2 weeks

## 2014-07-28 ENCOUNTER — Ambulatory Visit (HOSPITAL_COMMUNITY): Payer: Self-pay | Admitting: Psychology

## 2014-07-28 DIAGNOSIS — E119 Type 2 diabetes mellitus without complications: Secondary | ICD-10-CM | POA: Diagnosis not present

## 2014-07-28 DIAGNOSIS — Z794 Long term (current) use of insulin: Secondary | ICD-10-CM | POA: Diagnosis not present

## 2014-07-28 DIAGNOSIS — F319 Bipolar disorder, unspecified: Secondary | ICD-10-CM | POA: Diagnosis not present

## 2014-07-28 DIAGNOSIS — I10 Essential (primary) hypertension: Secondary | ICD-10-CM | POA: Diagnosis not present

## 2014-07-28 DIAGNOSIS — F209 Schizophrenia, unspecified: Secondary | ICD-10-CM | POA: Diagnosis not present

## 2014-07-30 ENCOUNTER — Ambulatory Visit (HOSPITAL_COMMUNITY)
Admission: RE | Admit: 2014-07-30 | Discharge: 2014-07-30 | Disposition: A | Discharge status: Home / Self Care | Payer: Commercial Managed Care - HMO | Source: Ambulatory Visit | Attending: Internal Medicine | Admitting: Internal Medicine

## 2014-07-30 ENCOUNTER — Ambulatory Visit (HOSPITAL_COMMUNITY): Payer: Self-pay | Admitting: Psychiatry

## 2014-07-30 DIAGNOSIS — K224 Dyskinesia of esophagus: Secondary | ICD-10-CM | POA: Insufficient documentation

## 2014-07-30 DIAGNOSIS — R1310 Dysphagia, unspecified: Secondary | ICD-10-CM | POA: Diagnosis not present

## 2014-07-30 DIAGNOSIS — R1314 Dysphagia, pharyngoesophageal phase: Secondary | ICD-10-CM

## 2014-07-31 DIAGNOSIS — M150 Primary generalized (osteo)arthritis: Secondary | ICD-10-CM | POA: Diagnosis not present

## 2014-07-31 DIAGNOSIS — G4733 Obstructive sleep apnea (adult) (pediatric): Secondary | ICD-10-CM | POA: Diagnosis not present

## 2014-07-31 DIAGNOSIS — I639 Cerebral infarction, unspecified: Secondary | ICD-10-CM | POA: Diagnosis not present

## 2014-07-31 DIAGNOSIS — E119 Type 2 diabetes mellitus without complications: Secondary | ICD-10-CM | POA: Diagnosis not present

## 2014-08-02 NOTE — Assessment & Plan Note (Signed)
Annual exam as documented. Counseling done  re healthy lifestyle involving commitment to 150 minutes exercise per week, heart healthy diet, and attaining healthy weight.The importance of adequate sleep also discussed. Regular seat belt use and home safety, is also discussed. Changes in health habits are decided on by the patient with goals and time frames  set for achieving them. Immunization and cancer screening needs are specifically addressed at this visit.  

## 2014-08-03 DIAGNOSIS — I10 Essential (primary) hypertension: Secondary | ICD-10-CM | POA: Diagnosis not present

## 2014-08-03 DIAGNOSIS — F319 Bipolar disorder, unspecified: Secondary | ICD-10-CM | POA: Diagnosis not present

## 2014-08-03 DIAGNOSIS — F209 Schizophrenia, unspecified: Secondary | ICD-10-CM | POA: Diagnosis not present

## 2014-08-03 DIAGNOSIS — E119 Type 2 diabetes mellitus without complications: Secondary | ICD-10-CM | POA: Diagnosis not present

## 2014-08-03 DIAGNOSIS — Z794 Long term (current) use of insulin: Secondary | ICD-10-CM | POA: Diagnosis not present

## 2014-08-04 ENCOUNTER — Ambulatory Visit
Admission: RE | Admit: 2014-08-04 | Discharge: 2014-08-04 | Disposition: A | Discharge status: Home / Self Care | Payer: Commercial Managed Care - HMO | Source: Ambulatory Visit | Attending: Family Medicine | Admitting: Family Medicine

## 2014-08-04 DIAGNOSIS — Z1231 Encounter for screening mammogram for malignant neoplasm of breast: Secondary | ICD-10-CM | POA: Diagnosis not present

## 2014-08-06 DIAGNOSIS — F209 Schizophrenia, unspecified: Secondary | ICD-10-CM | POA: Diagnosis not present

## 2014-08-06 DIAGNOSIS — F319 Bipolar disorder, unspecified: Secondary | ICD-10-CM | POA: Diagnosis not present

## 2014-08-06 DIAGNOSIS — M5441 Lumbago with sciatica, right side: Secondary | ICD-10-CM | POA: Diagnosis not present

## 2014-08-06 DIAGNOSIS — I10 Essential (primary) hypertension: Secondary | ICD-10-CM | POA: Diagnosis not present

## 2014-08-06 DIAGNOSIS — M961 Postlaminectomy syndrome, not elsewhere classified: Secondary | ICD-10-CM | POA: Diagnosis not present

## 2014-08-06 DIAGNOSIS — E119 Type 2 diabetes mellitus without complications: Secondary | ICD-10-CM | POA: Diagnosis not present

## 2014-08-06 DIAGNOSIS — M79609 Pain in unspecified limb: Secondary | ICD-10-CM | POA: Diagnosis not present

## 2014-08-07 ENCOUNTER — Ambulatory Visit (HOSPITAL_COMMUNITY): Payer: Self-pay | Admitting: Psychiatry

## 2014-08-07 DIAGNOSIS — E119 Type 2 diabetes mellitus without complications: Secondary | ICD-10-CM | POA: Diagnosis not present

## 2014-08-07 DIAGNOSIS — Z794 Long term (current) use of insulin: Secondary | ICD-10-CM | POA: Diagnosis not present

## 2014-08-07 DIAGNOSIS — F319 Bipolar disorder, unspecified: Secondary | ICD-10-CM | POA: Diagnosis not present

## 2014-08-07 DIAGNOSIS — I10 Essential (primary) hypertension: Secondary | ICD-10-CM | POA: Diagnosis not present

## 2014-08-07 DIAGNOSIS — F209 Schizophrenia, unspecified: Secondary | ICD-10-CM | POA: Diagnosis not present

## 2014-08-10 DIAGNOSIS — G4733 Obstructive sleep apnea (adult) (pediatric): Secondary | ICD-10-CM | POA: Diagnosis not present

## 2014-08-11 ENCOUNTER — Ambulatory Visit: Payer: Commercial Managed Care - HMO

## 2014-08-11 VITALS — BP 98/50 | Wt 168.0 lb

## 2014-08-11 DIAGNOSIS — I10 Essential (primary) hypertension: Secondary | ICD-10-CM

## 2014-08-11 NOTE — Progress Notes (Signed)
Patient will d/c amlodipine per Dr and keep her next follow up appt. Nurse Antony Madura from [facility] will be there to see her on Wednesday. Patient aware

## 2014-08-12 ENCOUNTER — Other Ambulatory Visit: Payer: Self-pay | Admitting: Internal Medicine

## 2014-08-12 DIAGNOSIS — Z794 Long term (current) use of insulin: Secondary | ICD-10-CM | POA: Diagnosis not present

## 2014-08-12 DIAGNOSIS — I10 Essential (primary) hypertension: Secondary | ICD-10-CM | POA: Diagnosis not present

## 2014-08-12 DIAGNOSIS — F319 Bipolar disorder, unspecified: Secondary | ICD-10-CM | POA: Diagnosis not present

## 2014-08-12 DIAGNOSIS — E119 Type 2 diabetes mellitus without complications: Secondary | ICD-10-CM | POA: Diagnosis not present

## 2014-08-12 DIAGNOSIS — F209 Schizophrenia, unspecified: Secondary | ICD-10-CM | POA: Diagnosis not present

## 2014-08-12 NOTE — Telephone Encounter (Signed)
Allergies  Allergen Reactions  . Cephalexin Hives  . Iron Nausea And Vomiting  . Milk-Related Compounds Other (See Comments)    Doesn't agree with stomach.   . Penicillins Hives  . Phenazopyridine Hcl Other (See Comments)    [place]     Pt is requesting an rx for Chantix starter pack. Last OV was 05/05/14 and following was printed in notes   Script printed to try Chantix  Aim to quit smoking 1 week after you start the Chantix, so it has time to start in your system  If you feel that the Chantix is affecting your mind or your mood, then stop it immediately. Hopefully it won't cause a problem.    Please advise on refill request.

## 2014-08-13 ENCOUNTER — Telehealth: Payer: Self-pay

## 2014-08-13 NOTE — Telephone Encounter (Signed)
Please let her know that her esophagus does not appear to be contracting as it should , and that Dr  Gala Romney may need to have her get the test that he initally wanted to do with the barium pill. PLS also encourage her to directly call the Docs/ offices/ specialists who have ordered the tests she has concerns about   dealing with her specific concerns, as they can and are most qualified to answer those specific concerns concerns

## 2014-08-13 NOTE — Telephone Encounter (Signed)
States she did a swallow test with Dr Gala Romney and they called with results and wasn't her to come back in for more testing but she wants your opinion on what the results are and what she should do. I read the note from [place] but didn't know what to tell her. Please advise

## 2014-08-13 NOTE — Telephone Encounter (Signed)
Ok to Rx as requested

## 2014-08-14 ENCOUNTER — Ambulatory Visit: Payer: Self-pay | Admitting: Gastroenterology

## 2014-08-14 NOTE — Telephone Encounter (Signed)
Patient aware.

## 2014-08-17 DIAGNOSIS — F319 Bipolar disorder, unspecified: Secondary | ICD-10-CM | POA: Diagnosis not present

## 2014-08-17 DIAGNOSIS — Z794 Long term (current) use of insulin: Secondary | ICD-10-CM | POA: Diagnosis not present

## 2014-08-17 DIAGNOSIS — I10 Essential (primary) hypertension: Secondary | ICD-10-CM | POA: Diagnosis not present

## 2014-08-17 DIAGNOSIS — E119 Type 2 diabetes mellitus without complications: Secondary | ICD-10-CM | POA: Diagnosis not present

## 2014-08-17 DIAGNOSIS — F209 Schizophrenia, unspecified: Secondary | ICD-10-CM | POA: Diagnosis not present

## 2014-08-19 DIAGNOSIS — M5440 Lumbago with sciatica, unspecified side: Secondary | ICD-10-CM | POA: Diagnosis not present

## 2014-08-20 ENCOUNTER — Ambulatory Visit (INDEPENDENT_AMBULATORY_CARE_PROVIDER_SITE_OTHER): Payer: Commercial Managed Care - HMO | Admitting: Gastroenterology

## 2014-08-20 ENCOUNTER — Encounter: Payer: Self-pay | Admitting: Gastroenterology

## 2014-08-20 VITALS — BP 144/74 | HR 81 | Temp 98.5°F | Ht 61.0 in | Wt 162.8 lb

## 2014-08-20 DIAGNOSIS — R140 Abdominal distension (gaseous): Secondary | ICD-10-CM | POA: Diagnosis not present

## 2014-08-20 DIAGNOSIS — K3184 Gastroparesis: Secondary | ICD-10-CM

## 2014-08-20 DIAGNOSIS — K5900 Constipation, unspecified: Secondary | ICD-10-CM

## 2014-08-20 DIAGNOSIS — R1310 Dysphagia, unspecified: Secondary | ICD-10-CM | POA: Diagnosis not present

## 2014-08-20 MED ORDER — PROMETHAZINE HCL 12.5 MG PO TABS: 12.5000 mg | ORAL_TABLET | Freq: Four times a day (QID) | ORAL | Status: DC | PRN

## 2014-08-20 NOTE — Patient Instructions (Addendum)
1. Please stop dicyclomine to see if this is contributing to your bloating, infrequent bowel movements.  2. We will try to get you set up to swallow the barium pill at no extra charge to you.  3. Please use phenergan sparingly as it will contribute to sedation.  4. Return to the office in 8 weeks.

## 2014-08-20 NOTE — Progress Notes (Signed)
Primary Care Physician: Tula Nakayama, MD  Primary Gastroenterologist:  Garfield Cornea, MD   Chief Complaint  Patient presents with  . Follow-up    HPI: [name] is a 64 y.o. female here for f/u. Last seen in 07/2014 for epigastric pain, dysphagia, fecal incontinence, gastroparesis, microcytic anemia likely anemia of chronic disease.   Intermittent diarrhea only after finally get first stool to start. Happens sometimes. BM only 1-2 times per week. Taking Bentyl twice per day. Stool not hard. No melena, brbpr. Nausea real bad, sometimes with vomiting. Sometimes just bitter stuff. Some ongoing dysphagia to bread. Some cramping pain, bloating. Can't stand anything tight around the waist. No melena, brbpr. Esophagram showed mild to moderate esophageal dysmotility, no fixed esophageal narrowing or stricture, apparent narrowing at the GE junction however on prone swallows the distal esophagus was confirmed to distend normally. Thought to favor LES dysfunction. Barium tablet was not administered. Per Dr. Thornton Papas, it would definitely become lodged and that is why the radiologist did not give it.      Current Outpatient Prescriptions  Medication Sig Dispense Refill  . amLODipine (NORVASC) 2.5 MG tablet Take 1 tablet (2.5 mg total) by mouth daily. 30 tablet 3  . aspirin EC 81 MG tablet Take 81 mg by mouth daily.    . CHANTIX STARTING MONTH PAK 0.5 MG X 11 & 1 MG X 42 tablet TAKE AS DIRECTED PER PACKAGE INSTRUCTIONS. 53 tablet 0  . Cholecalciferol (D 5000) 5000 UNITS capsule Take 5,000 Units by mouth daily.    . clobetasol cream (TEMOVATE) 9.56 % Apply 1 application topically daily.    . cloNIDine (CATAPRES) 0.3 MG tablet TAKE 1 TABLET BY MOUTH EVERY EIGHT HOURS. ONCE AT 8AM, 4PM, AND 12 MIDNIGHT. 90 tablet 3  . CRESTOR 5 MG tablet TAKE 1 TABLET BY MOUTH AT BEDTIME. 30 tablet 2  . diclofenac sodium (VOLTAREN) 1 % GEL Apply 2 g topically daily as needed (Pain).    [name] dicyclomine (BENTYL)  10 MG capsule Take 1 capsule (10 mg total) by mouth 3 (three) times daily before meals. 120 capsule 3  . docusate sodium (COLACE) 100 MG capsule Take 2 capsules (200 mg total) by mouth at bedtime. 30 capsule 0  . fluticasone (FLONASE) 50 MCG/ACT nasal spray Place 2 sprays into both nostrils daily.    [name] glucose blood (ACCU-CHEK AVIVA PLUS) test strip Use to test blood sugar 4 times daily as instructed. Dx code: E10.65 400 each 3  . hydroxychloroquine (PLAQUENIL) 200 MG tablet Take 200 mg by mouth daily.    . insulin aspart (NOVOLOG FLEXPEN) 100 UNIT/ML FlexPen Inject 20-22 Units into the skin 3 (three) times daily with meals. 45 mL 1  . Insulin Glargine (TOUJEO SOLOSTAR) 300 UNIT/ML SOPN Inject 50 Units into the skin at bedtime. 12 pen 2  . Insulin Pen Needle (UNIFINE PENTIPS) 31G X 6 MM MISC Use to inject insulin 4 times daily as directed. 390 each 3  . lamoTRIgine (LAMICTAL) 100 MG tablet Take 1.5 tablets (150 mg total) by mouth every morning. 45 tablet 5  . levothyroxine (LEVOXYL) 50 MCG tablet Take 1 tablet (50 mcg total) by mouth every morning. 30 tablet 3  . LORazepam (ATIVAN) 0.5 MG tablet Take 1 tablet (0.5 mg total) by mouth at bedtime. 30 tablet 2  . losartan (COZAAR) 100 MG tablet TAKE 1 TABLET BY MOUTH ONCE DAILY. 30 tablet 2  . Magnesium 250 MG TABS Take 1 tablet by mouth  daily.    . meclizine (ANTIVERT) 12.5 MG tablet Take 1 tablet (12.5 mg total) by mouth 3 (three) times daily as needed. For nausea 30 tablet 0  . metoprolol (LOPRESSOR) 50 MG tablet TAKE 1 TABLET BY MOUTH TWICE DAILY. 60 tablet 5  . mirtazapine (REMERON) 30 MG tablet Take 30 mg by mouth at bedtime.    . mometasone-formoterol (DULERA) 100-5 MCG/ACT AERO Inhale 2 puffs into the lungs 2 (two) times daily. 1 Inhaler 0  . montelukast (SINGULAIR) 10 MG tablet TAKE ONE TABLET BY MOUTH ONCE DAILY. 30 tablet 2  . Multiple Vitamins-Minerals (ONE-A-DAY 50 PLUS PO) Take 1 tablet by mouth daily.    [name] nystatin (MYCOSTATIN) powder  Apply topically 4 (four) times daily. (Patient taking differently: Apply 1 g topically daily. After each bath.) 30 g 0  . ondansetron (ZOFRAN) 4 MG tablet Take 1 tablet (4 mg total) by mouth every 6 (six) hours as needed for nausea. 20 tablet 0  . pregabalin (LYRICA) 75 MG capsule Take 75 mg by mouth 2 (two) times daily.     . RABEprazole (ACIPHEX) 20 MG tablet TAKE 1 TABLET BY MOUTH TWICE DAILY. 60 tablet 5  . RESTASIS 0.05 % ophthalmic emulsion Place 1 drop into both eyes 2 (two) times daily.     . risperiDONE (RISPERDAL) 0.5 MG tablet Take 1 tablet (0.5 mg total) by mouth at bedtime. 30 tablet 2  . sertraline (ZOLOFT) 100 MG tablet Take 2 tablets (200 mg total) by mouth daily. 60 tablet 2  . spironolactone (ALDACTONE) 25 MG tablet TAKE ONE TABLET BY MOUTH DAILY. 30 tablet 2  . traZODone (DESYREL) 150 MG tablet Take 1 tablet (150 mg total) by mouth at bedtime. 30 tablet 2  . vitamin E 400 UNIT capsule Take 400 Units by mouth daily.    [name] ZETIA 10 MG tablet TAKE ONE TABLET BY MOUTH ONCE DAILY. 30 tablet 2  . HYDROmorphone (DILAUDID) 4 MG tablet Take 4 mg by mouth 3 (three) times daily as needed for moderate pain.      No current facility-administered medications for this visit.    Allergies as of 08/20/2014 - Review Complete 08/20/2014  Allergen Reaction Noted  . Cephalexin Hives   . Iron Nausea And Vomiting 11/15/2012  . Milk-related compounds Other (See Comments) 07/29/2008  . Penicillins Hives   . Phenazopyridine hcl Other (See Comments) 06/19/2007    ROS:  General: Negative for anorexia, weight loss, fever, chills, fatigue, weakness. ENT: Negative for hoarseness, nasal congestion. See hpi CV: Negative for chest pain, angina, palpitations, dyspnea on exertion, peripheral edema.  Respiratory: Negative for dyspnea at rest, dyspnea on exertion, cough, sputum, wheezing.  GI: See history of present illness. GU:  Negative for dysuria, hematuria, urinary incontinence, urinary frequency,  nocturnal urination.  Endo: Negative for unusual weight change.    Physical Examination:   BP 144/74 mmHg  Pulse 81  Temp(Src) 98.5 F (36.9 C)  Ht 5\' 1"  (1.549 m)  Wt 162 lb 12.8 oz (73.846 kg)  BMI 30.78 kg/m2  General: Well-nourished, well-developed in no acute distress.  Eyes: No icterus. Mouth: Oropharyngeal mucosa moist and pink , no lesions erythema or exudate. Lungs: Clear to auscultation bilaterally.  Heart: Regular rate and rhythm, no murmurs rubs or gallops.  Abdomen: Bowel sounds are normal, nontender, nondistended, no hepatosplenomegaly or masses, no abdominal bruits or hernia , no rebound or guarding.   Extremities: No lower extremity edema. No clubbing or deformities. Neuro: Alert and oriented x  4   Skin: Warm and dry, no jaundice.   Psych: Alert and cooperative, normal mood and affect.  Labs:  Lab Results  Component Value Date   LIPASE 16 07/15/2014   Lab Results  Component Value Date   CREATININE 0.90 07/15/2014   BUN 23 07/15/2014   NA 142 07/15/2014   K 4.6 07/15/2014   CL 107 07/15/2014   CO2 31 07/15/2014   Lab Results  Component Value Date   WBC 6.0 07/15/2014   HGB 10.5* 07/15/2014   HCT 35.3* 07/15/2014   MCV 73.8* 07/15/2014   PLT 215 07/15/2014   Lab Results  Component Value Date   ALT 18 07/15/2014   AST 18 07/15/2014   ALKPHOS 122* 07/15/2014   BILITOT 0.3 07/15/2014    Imaging Studies: Dg Esophagus  07/30/2014   CLINICAL DATA:  Dysphagia. Food/ meet getting stuck. Multiple prior esophageal dilatations.  EXAM: ESOPHOGRAM/BARIUM SWALLOW  TECHNIQUE: Combined double contrast and single contrast examination performed using effervescent crystals, thick barium liquid, and thin barium liquid.  FLUOROSCOPY TIME:  Fluoroscopy Time:  3 minutes 24 seconds  Number of Acquired Images:  20  COMPARISON:  None.  FINDINGS: Mild to moderate esophageal dysmotility.  No fixed esophageal narrowing or stricture.  Apparent narrowing at the GE junction.  However, on prone swallows, the distal esophagus was confirmed to distended normally. Given these findings, the appearance of the distal esophagus is favored to reflect lower esophageal sphincter dysfunction.  A barium tablet was not administered due to esophageal dysmotility.  IMPRESSION: Mild to moderate esophageal dysmotility.  Suspected lower esophageal sphincter dysfunction rather than true narrowing at the distal esophagus/ GE junction.  Normal caliber of the lower esophagus on prone swallows.   Electronically Signed   By: Julian Hy M.D.   On: 07/30/2014 12:00   Mm Screening Breast Tomo Bilateral  08/04/2014   CLINICAL DATA:  Screening.  EXAM: DIGITAL SCREENING BILATERAL MAMMOGRAM WITH 3D TOMO WITH CAD  COMPARISON:  Previous exam(s).  ACR Breast Density Category b: There are scattered areas of fibroglandular density.  FINDINGS: Postreduction changes are noted of both breasts. There are no findings suspicious for malignancy. Images were processed with CAD.  IMPRESSION: No mammographic evidence of malignancy. A result letter of this screening mammogram will be mailed directly to the patient.  RECOMMENDATION: Screening mammogram in one year. (Code:[id number])  BI-RADS CATEGORY  2: Benign.   Electronically Signed   By: Pamelia Hoit M.D.   On: 08/04/2014 19:00

## 2014-08-20 NOTE — Progress Notes (Signed)
Talked with Dr.Boles and he stated that she could come when she had time but the pill would get stuck because she was tight.

## 2014-08-21 ENCOUNTER — Other Ambulatory Visit: Payer: Self-pay | Admitting: Neurology

## 2014-08-21 ENCOUNTER — Other Ambulatory Visit: Payer: Self-pay | Admitting: Family Medicine

## 2014-08-21 NOTE — Telephone Encounter (Signed)
Rx sent 

## 2014-08-23 NOTE — Assessment & Plan Note (Addendum)
Hold bentyl to see if bowel function normalizes. Use narcotics sparingly. Return to the office in eight weeks.

## 2014-08-23 NOTE — Assessment & Plan Note (Signed)
Ongoing solid food dysphagia with esophageal dysmotility on recent barium esophagram. LES dysfunction suspected rather than stricture. Barium tablet was not administered. Last EGD in 2014. To discuss further with Dr. Gala Romney. Dr. Thornton Papas says the tablet would definitely get stuck and not felt to be necessary but would complete if wanted.

## 2014-08-23 NOTE — Assessment & Plan Note (Signed)
Encouraged tight glycemic control and minimize narcotics. Antiemetics as needed. Patient request phenergan as opposed to zofran reported better efficacy. Low dose phenergan provided. Return to the office in 8 weeks.

## 2014-08-24 ENCOUNTER — Encounter: Payer: Self-pay | Admitting: Family Medicine

## 2014-08-24 ENCOUNTER — Telehealth (HOSPITAL_COMMUNITY): Payer: Self-pay | Admitting: *Deleted

## 2014-08-24 DIAGNOSIS — F319 Bipolar disorder, unspecified: Secondary | ICD-10-CM | POA: Diagnosis not present

## 2014-08-24 DIAGNOSIS — Z794 Long term (current) use of insulin: Secondary | ICD-10-CM | POA: Diagnosis not present

## 2014-08-24 DIAGNOSIS — F209 Schizophrenia, unspecified: Secondary | ICD-10-CM | POA: Diagnosis not present

## 2014-08-24 DIAGNOSIS — E119 Type 2 diabetes mellitus without complications: Secondary | ICD-10-CM | POA: Diagnosis not present

## 2014-08-24 DIAGNOSIS — I10 Essential (primary) hypertension: Secondary | ICD-10-CM | POA: Diagnosis not present

## 2014-08-24 NOTE — Telephone Encounter (Signed)
Pt called 8-00 am stating that she need refills for her medication. Informed pt that there's no medications on her list that starts with an "M" that Dr. Harrington Challenger fills. Pt did not know the name of the medication and did not have the bottle anymore due to pharmacy discarding medication bottle for her. Informed pt that RMA will call her pharmacy to ask them which medication needed refills. Call pharmacy at 9:06 am due to that'd when they open, and spoke with Ovid Curd and he stated pt need refills for pt  Remeron. Informed Ovid Curd that on 06-30-14 Dr. Harrington Challenger took pt off of this medication. Per Ovid Curd, he will note that in pt's chart. Called pt and informed her of this and she showed underanding

## 2014-08-25 ENCOUNTER — Other Ambulatory Visit: Payer: Self-pay | Admitting: Family Medicine

## 2014-08-25 ENCOUNTER — Ambulatory Visit (INDEPENDENT_AMBULATORY_CARE_PROVIDER_SITE_OTHER): Payer: Commercial Managed Care - HMO | Admitting: Internal Medicine

## 2014-08-25 ENCOUNTER — Encounter: Payer: Self-pay | Admitting: Internal Medicine

## 2014-08-25 VITALS — BP 132/68 | HR 61 | Ht 61.0 in | Wt 162.6 lb

## 2014-08-25 DIAGNOSIS — E785 Hyperlipidemia, unspecified: Secondary | ICD-10-CM

## 2014-08-25 DIAGNOSIS — G4710 Hypersomnia, unspecified: Secondary | ICD-10-CM | POA: Diagnosis not present

## 2014-08-25 DIAGNOSIS — I10 Essential (primary) hypertension: Secondary | ICD-10-CM

## 2014-08-25 DIAGNOSIS — F060 Psychotic disorder with hallucinations due to known physiological condition: Secondary | ICD-10-CM

## 2014-08-25 NOTE — Progress Notes (Signed)
OFFICE NOTE  Chief Complaint:  Routine follow-up  Primary Care Physician: Tula Nakayama, MD  HPI:  [name] is a 64 year-old female with history of hypertension, diabetes, dyslipidemia, obstructive sleep apnea on CPAP as well as struggling with depression, Bipolar Disorder, insomnia and chronic low back pain. Recently she underwent brain surgery after I had cleared her and felt that she was an intermediate risk for perioperative events. She did do well with the surgery and reports her pain as improved as well as some of her lower extremity swelling. Her main complaint today is depression and insomnia, and I understand that she has seen you about this and as far as management has been deferred to her psychiatrist. She is complaining that she cannot get to see her psychiatrist as he is on extended medical leave. From a cardiac standpoint, her blood pressure is actually very well controlled on her current regimen including clonidine, today was 140/80, heart rate in the 80s, and she denies any chest pain, worsening shortness of breath, palpitations, presyncope, or syncopal symptoms. Her main complaints have to do with memory loss, confusion and speech difficulty as well as left arm pain for which she is seeing Dr. Aline Brochure.  [name] returns today for cardiac followup. She denies any worsening shortness of breath or chest pain. She struggling with both sleep apnea and narcolepsy. She has fallen asleep during office visits and was very somnolent in the office today. Blood pressure was low normal and heart rate to well controlled.  PMHx:  Past Medical History  Diagnosis Date  . Bipolar disorder   . CVA (cerebral infarction)   . Pancreatitis 2006    due to Depakote with normal EUS   . Osteoporosis   . Chronic back pain   . Diabetes mellitus   . Trigger finger   . Anxiety disorder   . Hypertension   . Migraines     chronic headaches  . Diverticulosis     TCS 9/08 by Dr.  Delfin Edis for diarrhea . Bx for micro scopic colitis negative.   . Schatzki's ring     non critical / EGD with ED 8/2011with RMR  . S/P colonoscopy 9735,3299, 2011    left-sided diverticula, hx of simple adenomas . 2011, random bx negative for microscopic colitis  . Glaucoma   . Allergic rhinitis   . Hypothyroidism     thyroid goiter  . Anemia   . Blood transfusion   . GERD (gastroesophageal reflux disease)   . Stroke     left sided weakness  . Seizures     unknown etiology-on meds-last seizure was 3 yerars ago  . Anxiety   . Depression   . Metabolic encephalopathy 2/42/6834  . Sleep apnea     on CPAP  . Arthritis   . Gum symptoms     infection on antibiotic  . Chronic neck pain   . Mononeuritis lower limb   . Frequent falls     Past Surgical History  Procedure Laterality Date  . Abdominal hysterectomy  1978  . Cholecystectomy  1984  . Ovarian cyst removal    . Carpal tunnel release Left 07/22/04    Dr. Aline Brochure  . Breast reduction surgery  1994  . Cataract extraction Bilateral   . Biopsy thyroid  2009  . Surgical excision of 3 tumors from right thigh and right buttock  and left upper thigh  2010  . Back surgery  July 2012  . Spine surgery  09/29/2010    Dr. Rolena Infante  . Maloney dilation  12/29/2010    RMR;  . Esophagogastroduodenoscopy  12/29/2010    Rourk-Retained food in the esophagus and stomach, small hiatal hernia, status post Maloney dilation of the esophagus  . Craniotomy  11/23/2011    Procedure: CRANIOTOMY TUMOR EXCISION;  Surgeon: Hosie Spangle, MD;  Location: [place] [facility];  Service: Neurosurgery;  Laterality: N/A;  Craniotomy for tumor resection  . Brain surgery  11/2011    resection of meningioma  . Colonoscopy N/A 09/25/2012    XTG:GYIRSWN diverticulosis.  colonic polyp-removed : tubular adenoma  . Esophagogastroduodenoscopy N/A 09/25/2012    [id number] atonic baggy esophagus status post Maloney dilation 16 F. Hiatal hernia  . Givens capsule study  N/A 01/15/2013    NORMAL.   . Bacterial overgrowth test N/A 05/05/2013    Procedure: BACTERIAL OVERGROWTH TEST;  Surgeon: Daneil Dolin, MD;  Location: [facility];  Service: Endoscopy;  Laterality: N/A;  7:30  . Transthoracic echocardiogram  2010    EF 60-65%, mild conc LVH, grade 1 diastolic dysfunction; mildly calcified MV annulus with mildly thickened leaflets, mildly calcified MR annulus  . Nm myocar perf wall motion  2006    "relavtiely normal" persantine, mild anterior thinning (breast attenuation artifact), no region of scar/ischemia  . Cardiac catheterization  05/10/2005    normal coronaries, normal LV systolic function and EF (Dr. Jackie Plum)    FAMHx:  Family History  Problem Relation Age of Onset  . Heart attack Mother     HTN  . Pneumonia Father   . Kidney failure Father   . Diabetes Father   . Pancreatic cancer Sister   . Diabetes Brother   . Hypertension Brother   . Diabetes Brother   . Colon cancer Neg Hx   . Anesthesia problems Neg Hx   . Hypotension Neg Hx   . Malignant hyperthermia Neg Hx   . Pseudochol deficiency Neg Hx   . Stroke Maternal Grandmother   . Heart attack Maternal Grandfather   . Cancer Sister     breast   . Alcohol abuse Maternal Uncle   . Hypertension Brother     SOCHx:   reports that she quit smoking about 2 months ago. Her smoking use included Cigarettes. She has a 1.75 pack-year smoking history. She has never used smokeless tobacco. She reports that she does not drink alcohol or use illicit drugs.  ALLERGIES:  Allergies  Allergen Reactions  . Cephalexin Hives  . Iron Nausea And Vomiting  . Milk-Related Compounds Other (See Comments)    Doesn't agree with stomach.   . Penicillins Hives  . Phenazopyridine Hcl Other (See Comments)    [place]      ROS: A comprehensive review of systems was negative except for: Constitutional: positive for fatigue and somnolence Neurological: positive for memory problems, speech problems and  narcolepsy  HOME MEDS: Current Outpatient Prescriptions  Medication Sig Dispense Refill  . amLODipine (NORVASC) 2.5 MG tablet Take 1 tablet (2.5 mg total) by mouth daily. 30 tablet 3  . aspirin EC 81 MG tablet Take 81 mg by mouth daily.    . CHANTIX STARTING MONTH PAK 0.5 MG X 11 & 1 MG X 42 tablet TAKE AS DIRECTED PER PACKAGE INSTRUCTIONS. 53 tablet 0  . Cholecalciferol (D 5000) 5000 UNITS capsule Take 5,000 Units by mouth daily.    . clobetasol cream (TEMOVATE) 8.18 % Apply 1 application topically daily.    . cloNIDine (CATAPRES)  0.3 MG tablet TAKE 1 TABLET BY MOUTH EVERY EIGHT HOURS. ONCE AT 8AM, 4PM, AND 12 MIDNIGHT. 90 tablet 3  . CRESTOR 5 MG tablet TAKE 1 TABLET BY MOUTH AT BEDTIME. 30 tablet 2  . diclofenac sodium (VOLTAREN) 1 % GEL Apply 2 g topically daily as needed (Pain).    [name] dicyclomine (BENTYL) 10 MG capsule Take 1 capsule by mouth 2 (two) times daily.    [name] docusate sodium (COLACE) 100 MG capsule Take 2 capsules (200 mg total) by mouth at bedtime. 30 capsule 0  . fluticasone (FLONASE) 50 MCG/ACT nasal spray Place 2 sprays into both nostrils daily.    [name] glucose blood (ACCU-CHEK AVIVA PLUS) test strip Use to test blood sugar 4 times daily as instructed. Dx code: E10.65 400 each 3  . hydrochlorothiazide (HYDRODIURIL) 25 MG tablet Take 1 tablet by mouth daily.    [name] HYDROmorphone (DILAUDID) 4 MG tablet Take 4 mg by mouth 3 (three) times daily as needed for moderate pain.     . hydroxychloroquine (PLAQUENIL) 200 MG tablet Take 200 mg by mouth daily.    . insulin aspart (NOVOLOG FLEXPEN) 100 UNIT/ML FlexPen Inject 20-22 Units into the skin 3 (three) times daily with meals. 45 mL 1  . Insulin Glargine (TOUJEO SOLOSTAR) 300 UNIT/ML SOPN Inject 50 Units into the skin at bedtime. 12 pen 2  . Insulin Pen Needle (UNIFINE PENTIPS) 31G X 6 MM MISC Use to inject insulin 4 times daily as directed. 390 each 3  . lamoTRIgine (LAMICTAL) 100 MG tablet TAKE 1 & 1/2 TABLETS (150mg ) BY MOUTH EVERY  MORNING. 45 tablet 3  . levothyroxine (LEVOXYL) 50 MCG tablet Take 1 tablet (50 mcg total) by mouth every morning. 30 tablet 3  . LORazepam (ATIVAN) 0.5 MG tablet Take 1 tablet (0.5 mg total) by mouth at bedtime. 30 tablet 2  . losartan (COZAAR) 100 MG tablet TAKE 1 TABLET BY MOUTH ONCE DAILY. 30 tablet 2  . Magnesium 250 MG TABS Take 1 tablet by mouth daily.    . meclizine (ANTIVERT) 12.5 MG tablet Take 1 tablet (12.5 mg total) by mouth 3 (three) times daily as needed. For nausea 30 tablet 0  . methocarbamol (ROBAXIN) 500 MG tablet Take 1 tablet by mouth daily.    . metoprolol (LOPRESSOR) 50 MG tablet TAKE 1 TABLET BY MOUTH TWICE DAILY. 60 tablet 5  . mometasone-formoterol (DULERA) 100-5 MCG/ACT AERO Inhale 2 puffs into the lungs 2 (two) times daily. 1 Inhaler 0  . montelukast (SINGULAIR) 10 MG tablet TAKE ONE TABLET BY MOUTH ONCE DAILY. 30 tablet 2  . Multiple Vitamins-Minerals (ONE-A-DAY 50 PLUS PO) Take 1 tablet by mouth daily.    [name] nystatin (MYCOSTATIN) powder Apply topically 4 (four) times daily. (Patient taking differently: Apply 1 g topically daily. After each bath.) 30 g 0  . pregabalin (LYRICA) 75 MG capsule Take 75 mg by mouth 2 (two) times daily.     . promethazine (PHENERGAN) 12.5 MG tablet Take 1 tablet (12.5 mg total) by mouth every 6 (six) hours as needed for nausea or vomiting. 30 tablet 0  . RABEprazole (ACIPHEX) 20 MG tablet TAKE 1 TABLET BY MOUTH TWICE DAILY. 60 tablet 5  . RESTASIS 0.05 % ophthalmic emulsion Place 1 drop into both eyes 2 (two) times daily.     . risperiDONE (RISPERDAL) 0.5 MG tablet Take 1 tablet (0.5 mg total) by mouth at bedtime. 30 tablet 2  . sertraline (ZOLOFT) 100 MG tablet Take 2 tablets (  200 mg total) by mouth daily. 60 tablet 2  . spironolactone (ALDACTONE) 25 MG tablet TAKE ONE TABLET BY MOUTH DAILY. 30 tablet 2  . thioridazine (MELLARIL) 10 MG tablet Take 1 tablet by mouth at bedtime.    . traZODone (DESYREL) 150 MG tablet Take 1 tablet (150 mg  total) by mouth at bedtime. 30 tablet 2  . vitamin E 400 UNIT capsule Take 400 Units by mouth daily.    [name] ZETIA 10 MG tablet TAKE ONE TABLET BY MOUTH ONCE DAILY. 30 tablet 2   No current facility-administered medications for this visit.    LABS/IMAGING: No results found. However, due to the size of the patient record, not all encounters were searched. Please check Results Review for a complete set of results. No results found.  VITALS: BP 132/68 mmHg  Pulse 61  Ht 5\' 1"  (1.549 m)  Wt 162 lb 9.6 oz (73.755 kg)  BMI 30.74 kg/m2  EXAM: General appearance: no distress and somnolent Neck: no adenopathy, no carotid bruit, no JVD, supple, symmetrical, trachea midline and thyroid not enlarged, symmetric, no tenderness/mass/nodules Lungs: clear to auscultation bilaterally Heart: regular rate and rhythm, S1, S2 normal, no murmur, click, rub or gallop Abdomen: soft, non-tender; bowel sounds normal; no masses,  no organomegaly Extremities: extremities normal, atraumatic, no cyanosis or edema Pulses: 2+ and symmetric Skin: Skin color, texture, turgor normal. No rashes or lesions Neurologic: Mental status: Alert and oriented, speech is slow and somewhat rambling  EKG: Normal sinus rhythm at 61  ASSESSMENT: 1. Hypertension-controlled 2. Hyperlipidemia 3. Diabetes type 2 4. Obstructive sleep apnea on CPAP 5. Depression/insomnia/bipolar disorder 6. Recent meningioma status post resection 7. Narcolepsy 8. Varicose veins  PLAN: 1.   [name] has well controlled blood pressure. She is on medication for diabetes and dyslipidemia. She does have some known atherosclerosis. She's not having chest pain symptoms. She does get short of breath but it is stable at her baseline. Echocardiogram in 2010 and showed normal systolic function. Her main issues have to do with sleep apnea and narcolepsy.  There do not appear to be any active cardiac issues at this time. She reported some bumps on her lower legs  which are consistent with varicose veins. She has at times they swell and are somewhat uncomfortable. I recommended 20-30 mmHg knee-high compression stockings bilaterally.   Plan to see her back annually or sooner as necessary.   Pixie Casino, MD, [facility] Attending Cardiologist [place] 08/25/2014, 5:40 PM

## 2014-08-25 NOTE — Patient Instructions (Signed)
Your physician wants you to follow-up in: 1 Year You will receive a reminder letter in the mail two months in advance. If you don't receive a letter, please call our office to schedule the follow-up appointment.  Your physician want you to wear Stocking 58mm-30mm

## 2014-08-26 NOTE — Progress Notes (Signed)
cc'd to pcp 

## 2014-08-31 ENCOUNTER — Ambulatory Visit: Payer: Commercial Managed Care - HMO | Admitting: Family Medicine

## 2014-08-31 DIAGNOSIS — F319 Bipolar disorder, unspecified: Secondary | ICD-10-CM | POA: Diagnosis not present

## 2014-08-31 DIAGNOSIS — F209 Schizophrenia, unspecified: Secondary | ICD-10-CM | POA: Diagnosis not present

## 2014-08-31 DIAGNOSIS — I10 Essential (primary) hypertension: Secondary | ICD-10-CM | POA: Diagnosis not present

## 2014-08-31 DIAGNOSIS — Z794 Long term (current) use of insulin: Secondary | ICD-10-CM | POA: Diagnosis not present

## 2014-08-31 DIAGNOSIS — E119 Type 2 diabetes mellitus without complications: Secondary | ICD-10-CM | POA: Diagnosis not present

## 2014-09-01 ENCOUNTER — Encounter (HOSPITAL_COMMUNITY): Payer: Self-pay | Admitting: Psychiatry

## 2014-09-01 ENCOUNTER — Telehealth (HOSPITAL_COMMUNITY): Payer: Self-pay | Admitting: *Deleted

## 2014-09-01 ENCOUNTER — Ambulatory Visit: Payer: Commercial Managed Care - HMO

## 2014-09-01 ENCOUNTER — Ambulatory Visit (INDEPENDENT_AMBULATORY_CARE_PROVIDER_SITE_OTHER): Payer: Medicare HMO | Admitting: Psychiatry

## 2014-09-01 VITALS — BP 167/86 | HR 68 | Ht 61.0 in | Wt 163.4 lb

## 2014-09-01 DIAGNOSIS — F332 Major depressive disorder, recurrent severe without psychotic features: Secondary | ICD-10-CM | POA: Diagnosis not present

## 2014-09-01 DIAGNOSIS — F331 Major depressive disorder, recurrent, moderate: Secondary | ICD-10-CM

## 2014-09-01 DIAGNOSIS — R32 Unspecified urinary incontinence: Secondary | ICD-10-CM | POA: Diagnosis not present

## 2014-09-01 MED ORDER — RISPERIDONE 0.5 MG PO TABS: 0.5000 mg | ORAL_TABLET | Freq: Every day | ORAL | Status: DC

## 2014-09-01 MED ORDER — TRAZODONE HCL 100 MG PO TABS: 200.0000 mg | ORAL_TABLET | Freq: Every day | ORAL | Status: DC

## 2014-09-01 MED ORDER — LORAZEPAM 0.5 MG PO TABS: 0.5000 mg | ORAL_TABLET | Freq: Three times a day (TID) | ORAL | Status: DC

## 2014-09-01 MED ORDER — SERTRALINE HCL 100 MG PO TABS
200.0000 mg | ORAL_TABLET | Freq: Every day | ORAL | Status: DC
Start: 2014-09-01 — End: 2014-11-09

## 2014-09-01 NOTE — Telephone Encounter (Signed)
Per Dr. Harrington Challenger to call in pt Lozazepam 0.5 mg with 2 refills. Called pt pharmacy and spoke with Nicki Reaper and he showed understanding. After script was called in, script was shredded.
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
## 2014-09-02 ENCOUNTER — Ambulatory Visit (INDEPENDENT_AMBULATORY_CARE_PROVIDER_SITE_OTHER): Payer: Commercial Managed Care - HMO

## 2014-09-02 DIAGNOSIS — N3000 Acute cystitis without hematuria: Secondary | ICD-10-CM | POA: Diagnosis not present

## 2014-09-02 LAB — POCT URINALYSIS DIPSTICK
BILIRUBIN UA: NEGATIVE
Blood, UA: NEGATIVE
Glucose, UA: NEGATIVE
Ketones, UA: NEGATIVE
Nitrite, UA: NEGATIVE
Protein, UA: NEGATIVE
Spec Grav, UA: 1.025
UROBILINOGEN UA: 0.2
pH, UA: 5

## 2014-09-02 NOTE — Progress Notes (Signed)
Will send for culture and await results

## 2014-09-04 ENCOUNTER — Telehealth: Payer: Self-pay | Admitting: Family Medicine

## 2014-09-04 LAB — URINE CULTURE: Colony Count: 4000

## 2014-09-06 DIAGNOSIS — M79609 Pain in unspecified limb: Secondary | ICD-10-CM | POA: Diagnosis not present

## 2014-09-07 ENCOUNTER — Ambulatory Visit (INDEPENDENT_AMBULATORY_CARE_PROVIDER_SITE_OTHER): Payer: Commercial Managed Care - HMO | Admitting: Internal Medicine

## 2014-09-07 ENCOUNTER — Encounter: Payer: Self-pay | Admitting: Internal Medicine

## 2014-09-07 VITALS — BP 122/70 | HR 72 | Ht 59.0 in | Wt 161.8 lb

## 2014-09-07 DIAGNOSIS — Z794 Long term (current) use of insulin: Secondary | ICD-10-CM | POA: Diagnosis not present

## 2014-09-07 DIAGNOSIS — F319 Bipolar disorder, unspecified: Secondary | ICD-10-CM | POA: Diagnosis not present

## 2014-09-07 DIAGNOSIS — I10 Essential (primary) hypertension: Secondary | ICD-10-CM | POA: Diagnosis not present

## 2014-09-07 DIAGNOSIS — F209 Schizophrenia, unspecified: Secondary | ICD-10-CM | POA: Diagnosis not present

## 2014-09-07 DIAGNOSIS — G4733 Obstructive sleep apnea (adult) (pediatric): Secondary | ICD-10-CM | POA: Diagnosis not present

## 2014-09-07 DIAGNOSIS — E119 Type 2 diabetes mellitus without complications: Secondary | ICD-10-CM | POA: Diagnosis not present

## 2014-09-07 NOTE — Patient Instructions (Signed)
We can continue CPAP 8/ [employer]. Remember our goal is to wear it all night, every night  Please call as needed

## 2014-09-07 NOTE — Progress Notes (Signed)
06/18/12- 68 yoF referred courtesy of Dr Joycelyn Schmid [name]-sleep study last year per patient. She reports that she was witnessed stopping breathing in sleep, has a history of loud snoring and is a restless sleeper with frequent waking at night, bathroom trips and nightmares. Easy dyspnea on exertion during the day. She does smoke. New cough and sinus pressure this week attributed to "allergy" with green nasal discharge and no headache. She was sent to hospital April 1 when she says she couldn't wake up while at her doctor's office. She sleeps propped up. Gets sleepy in 30 minutes watching TV during the day. Bedtime 10 PM, sleep latency one or 2 hours, waking during the night 3 or 4 times before up at 8 AM. She had a sleep study at [facility] and was given CPAP but says she can't use it because the machine started smoking and she didn't like the pressure. She was changed from [place] to [place]. She is living alone, divorced and disabled. Son has OSA. History of meningioma 11/23/2011. No seizure in over 3 years. Does not drive. NPSG 3/8/13[name]- mild obstructive sleep apnea, AHI 14 per hour, mostly in REM. Epworth sleepiness score 14/24. Weight 164 pounds.  09/26/12- 57 yoF  Former smoker followed for obstructive sleep apnea follows for:  review download from [facility] with pt today.  pt stated that her back has been hurting her.   Did stop smoking! Some wheeze with exertion. She complains that she is falling asleep before she puts her CPAP/ auto/ [employer] on. Download demonstrated poor compliance and control. She had complained of a long history of difficulty falling asleep. Says a neurologist had questioned narcolepsy. She denies cataplexy or sleep paralysis.  11/19/12- 46 yoF  Former smoker followed for obstructive sleep apnea, ? Lung nodule, complicated by hx resection of meningioma FOLLOWS FOR:  Wearing CPAP 8/ [employer] 6-8 hours per night.  Discuss lung nodule was told she had by Dr.  Tula Nakayama Sleep is comfortable with CPAP. Nuvigil sample did not help with daytime alertness. There was questionable lung nodule but not seen on recent CT chest. Incidental slip in bathroom last week- black eye. CT 08/19/12 IMPRESSION:  The opacity in the inferior segment of the lingula of the left  upper lobe is without significant change. It is morphology is  consistent with scarring and / or atelectasis. This does not have  the configuration of a nodule. Other minor areas of reticular  subsegmental atelectasis are stable as well. There is no discrete  lung nodule. The lungs are otherwise clear.  No additional short-term follow-up is felt indicated. There are no  findings suspicious for neoplastic disease.  Original Report Authenticated By: Lajean Manes, M.D.  05/21/13- 51 yoF  Former smoker followed for obstructive sleep apnea, R/O'd Lung nodule, complicated by hx resection of meningioma FOLLOWS FOR: has been using old CPAP machine(has been waiting for [facility]  to get her outlets changed over for her new CPAP machine). They have done this and still using old CPAP. Pt states Dr Moshe Cipro wants her to have another sleep study. NPSG 05/29/11- AHI 14/ hr CPAP 8/ [employer] Always sleepy with bedtime between 8 and 9 PM, at 7:30 AM. At least twice during the night. "addicted to Ativan"-withdrawal if she skips it. CT chest 12/16/12 IMPRESSION:  Minimal chronic atelectasis versus scarring at the bases of left  lower lobe and lingula.  Otherwise negative exam, with no evidence of pulmonary nodule.  No further workup required.  Electronically Signed  By: Lavonia Dana M.D.  On: 12/16/2012 12:09  08/12/13- 72 yoF Former smoker followed for obstructive sleep apnea, R/O'd Lung nodule, complicated by hx resection of meningioma. Bipolar, GERD, DM Follows for:  Pt here to discuss results of her slpit night sleep study. She is still using her old CPAP machine and is waking up several times per night.    09/23/13- 25 yoF Former smoker followed for obstructive sleep apnea, R/O'd Lung nodule, complicated by hx resection of meningioma. Bipolar, GERD, DM FOLLOWS FOR: Pt states that she is trying to use CPAP 8/ [employer] as much as possible. Only ranging between 2-5hrs at night. Pt states that mask does not fit well. Pt reports that Nuvigil did not work for her. Pt c/o some chest pressure, wheezing, SOB and fatigue.   10/30/13- 89 yoF Former smoker followed for obstructive sleep apnea with hypersomnia, R/O'd Lung nodule, complicated by hx resection of meningioma. Bipolar, GERD, DM Referred by Dr Moshe Cipro for new problem of wheezing dyspnea Remote history of pneumonia with no history of asthma or other known lung disease. She had smoked for 15 or 20 years, stopping in 2014. The patient complains of shortness of breath with exertion and wheezing noted especially in the last 4 weeks with no significant triggering event. She does not recognize that she had an infection. She will wheeze some sitting quietly but has not noticed wheezing lying down, when she is wearing her CPAP. CPAP 8/ [employer]- All night every night Gets "strangled" occasionally trying to swallow a pill or food. Has some history of reflux Office Spirometry 10/30/2013-submaximal effort based on appearance of loop and curve. Numbers would fit with severe restriction but her physiologic capability may be better than this. FVC 0.91/44%, FEV1 0.74/45%, FEV1/FVC 0.81, FEF 25-75% 1.43/69%. CXR 09/15/13 IMPRESSION:  The study is limited due to hypoinflation. Minimal infrahilar  atelectasis on the right is suspected.  Electronically Signed  By: David Martinique  On: 09/15/2013 12:45  12/26/13- 40 yoF Former smoker followed for obstructive sleep apnea with hypersomnia, , R/O'd Lung nodule, complicated by hx resection of meningioma. Bipolar, GERD, DM FOLLOWS FOR:  breathing is worse without the inhaler.  increase SOB and wheezing with exertion.  She feels she  is doing better. Less sleepy .Dulera inhaler does help, needs refill. Has had flu and pneumonia vaccines. CXR 10/30/13 IMPRESSION:  Chronic RIGHT basilar atelectasis.  Electronically Signed  By: Lavonia Dana M.D.  On: 10/30/2013 16:57  05/05/14-  36 yoF Former smoker followed for obstructive sleep apnea with hypersomnia, , R/O'd Lung nodule, complicated by hx resection of meningioma. Bipolar, GERD, DM FOLLOWS FOR: Pt still gets sob, wheeze with exertion. She has a dry cough. Usually has some wheeze and cough most days. Wants further much to stop smoking and is interested in trying Chantix. We discussed possibility the drug would impact her mood/psychiatric problems. Nicotine patches did not work. Complains of sharp pain to right of sternum, persistent, localized but does go away at times. Continues CPAP 8/[facility] without problem, all night every night.  09/07/14-  49 yoF Former smoker followed for obstructive sleep apnea with hypersomnia, , R/O'd Lung nodule, complicated by hx resection of meningioma. Bipolar, GERD, DM FOLLOWS FOR: Breathing has been holding pretty good; Wears CPAP 8 most every night for about 4-5 hours. DME is [facility].  Falls asleep in a chair reading before she puts CPAP on some nights Lives alone. Sleeps head of bed up for GERD. CXR 05/05/14 IMPRESSION: Stable linear scarring  at the right lung base. No definite active process. Electronically Signed  By: Ivar Drape M.D.  On: 05/05/2014 16:20  ROS-see HPI Constitutional:   No-   weight loss, night sweats, fevers, chills, +fatigue, lassitude. HEENT:  + headaches, +difficulty swallowing, tooth/dental problems, sore throat,       No-sneezing, itching, ear ache, nasal congestion, post nasal drip,  CV:  +chest pain, orthopnea, PND, swelling in lower extremities, anasarca, dizziness, +palpitations Resp: +  shortness of breath with exertion or at rest.                productive cough,  + non-productive cough,  No- coughing up of  blood.              No-   change in color of mucus.  + wheezing.   Skin: No-   rash or lesions. GI:  +heartburn, +indigestion, no-abdominal pain, nausea, vomiting,  GU:  MS:  + joint pain or swelling.   Neuro-     nothing unusual Psych:  No- change in mood or affect. +depression or +anxiety.  No memory loss.  OBJ- Physical Exam - + more alert than usual General-  Oriented, Affect-flat, Distress- none acute Skin- rash-none, lesions- none, excoriation- none Lymphadenopathy- none Head- Eyes- Gross vision intact, PERRLA, conjunctivae and secretions clear            Ears- Hearing, canals-normal            Nose- Clear, no-Septal dev, mucus, polyps, erosion, perforation             Throat- Mallampati III , mucosa clear , drainage- none, tonsils- atrophic, + dentures Neck- flexible , trachea midline, no stridor , thyroid nl, carotid no bruit Chest - symmetrical excursion , unlabored           Heart/CV- RRR , no murmur , no gallop  , no rub, nl s1 s2                           - JVD- none , edema- none, stasis changes- none, varices- none           Lung- clear to P&A, wheeze- none, cough+rattling , dullness-none, rub- none           Chest wall- + mild tenderness to firm pressure over right mid sternal costochondral joint Abd-  Br/ Gen/ Rectal- Not done, not indicated Extrem- cyanosis- none, clubbing, none, atrophy- none, strength- nl. +cane Neuro- + seems fully oriented. Rocking in chair.

## 2014-09-08 ENCOUNTER — Telehealth: Payer: Self-pay | Admitting: *Deleted

## 2014-09-08 NOTE — Telephone Encounter (Signed)
Pt called requesting to speak with [name]. Please advise [phone]

## 2014-09-09 ENCOUNTER — Telehealth: Payer: Self-pay | Admitting: Internal Medicine

## 2014-09-09 ENCOUNTER — Telehealth: Payer: Self-pay | Admitting: *Deleted

## 2014-09-09 ENCOUNTER — Other Ambulatory Visit: Payer: Self-pay | Admitting: *Deleted

## 2014-09-09 MED ORDER — ACCU-CHEK AVIVA PLUS W/DEVICE KIT: PACK | Status: DC

## 2014-09-09 NOTE — Telephone Encounter (Signed)
Returned call to Abby, Therapist, sports with [employer], lvm advising her to return my call.

## 2014-09-09 NOTE — Telephone Encounter (Signed)
Spoke with Abby, RN with [employer] She spoke with pt. Pt needs a new meter.

## 2014-09-09 NOTE — Telephone Encounter (Signed)
Patient aware her urine culture was negative

## 2014-09-09 NOTE — Telephone Encounter (Signed)
Spoke with Abby. She advised the pt is having consistently in the 200's. After talking with pt, she is not eating well. Pt stated that she does not have much of an appetite. She is having some GI issues. She only had one meal yesterday, some stew beef with mixed vegetables. Pt is eating some fruit occasionally and having a Boost for meal replacement. Pt stated her b/s was 217 this AM, fasting. She had 2 times last week that her blood sugar was so high that it did not register on her meter. Advised pt to eat, even if it is something lite. Advised her to drink plenty of water so she does not get dehydrated and to help flush the sugars out. Pt voiced understanding. Will call pt back on Friday to check on her. Pt understood.

## 2014-09-09 NOTE — Telephone Encounter (Signed)
Abby with [employer] called please call her back regarding pt sugar levels  Call back at [phone] ext [phone]

## 2014-09-09 NOTE — Telephone Encounter (Signed)
Not due until age 64. Aware

## 2014-09-10 DIAGNOSIS — G4733 Obstructive sleep apnea (adult) (pediatric): Secondary | ICD-10-CM | POA: Diagnosis not present

## 2014-09-13 NOTE — Assessment & Plan Note (Signed)
She has seemed more appropriately alert last 2 visits. CPAP 8/[employer] does well for her if she doesn't fall asleep before she puts it on. We discussed sleep habits.

## 2014-09-16 NOTE — Telephone Encounter (Signed)
Encounter opened in error

## 2014-09-18 ENCOUNTER — Other Ambulatory Visit: Payer: Self-pay | Admitting: Internal Medicine

## 2014-09-18 ENCOUNTER — Other Ambulatory Visit: Payer: Self-pay | Admitting: Family Medicine

## 2014-09-18 DIAGNOSIS — M5440 Lumbago with sciatica, unspecified side: Secondary | ICD-10-CM | POA: Diagnosis not present

## 2014-09-18 NOTE — Telephone Encounter (Signed)
Rx has been sent to the pharmacy electronically. ° °

## 2014-09-21 ENCOUNTER — Ambulatory Visit (INDEPENDENT_AMBULATORY_CARE_PROVIDER_SITE_OTHER): Payer: Commercial Managed Care - HMO | Admitting: Internal Medicine

## 2014-09-21 ENCOUNTER — Other Ambulatory Visit: Payer: Self-pay | Admitting: Internal Medicine

## 2014-09-21 ENCOUNTER — Encounter: Payer: Self-pay | Admitting: Internal Medicine

## 2014-09-21 VITALS — BP 122/60 | HR 66 | Temp 98.0°F | Resp 12 | Wt 160.0 lb

## 2014-09-21 DIAGNOSIS — IMO0001 Reserved for inherently not codable concepts without codable children: Secondary | ICD-10-CM

## 2014-09-21 DIAGNOSIS — E1065 Type 1 diabetes mellitus with hyperglycemia: Secondary | ICD-10-CM | POA: Diagnosis not present

## 2014-09-21 DIAGNOSIS — E1165 Type 2 diabetes mellitus with hyperglycemia: Principal | ICD-10-CM

## 2014-09-21 DIAGNOSIS — E038 Other specified hypothyroidism: Secondary | ICD-10-CM | POA: Diagnosis not present

## 2014-09-21 DIAGNOSIS — Z794 Long term (current) use of insulin: Principal | ICD-10-CM

## 2014-09-21 LAB — TSH: TSH: 0.28 u[IU]/mL — ABNORMAL LOW (ref 0.35–4.50)

## 2014-09-21 LAB — HEMOGLOBIN A1C: Hgb A1c MFr Bld: 7 % — ABNORMAL HIGH (ref 4.6–6.5)

## 2014-09-21 LAB — T4, FREE: FREE T4: 0.73 ng/dL (ref 0.60–1.60)

## 2014-09-21 MED ORDER — INSULIN GLARGINE 300 UNIT/ML ~~LOC~~ SOPN: 55.0000 [IU] | PEN_INJECTOR | Freq: Every day | SUBCUTANEOUS | Status: DC

## 2014-09-21 MED ORDER — INSULIN ASPART 100 UNIT/ML FLEXPEN
20.0000 [IU] | PEN_INJECTOR | Freq: Three times a day (TID) | SUBCUTANEOUS | Status: DC
Start: 2014-09-21 — End: 2015-03-25

## 2014-09-21 MED ORDER — INSULIN PEN NEEDLE 32G X 4 MM MISC: Status: DC

## 2014-09-21 NOTE — Patient Instructions (Signed)
Please increase: - Toujeo to 55 units at bedtime Continue: - Novolog:  20 units before a regular meal  22 units before a large meal   Please return in 3 month with your sugar log.

## 2014-09-21 NOTE — Progress Notes (Signed)
Patient ID: [name], female   DOB: 1950/07/23, 64 y.o.   MRN: [id number]  HPI: [name] is a 57 y.o.-year-old female, initially referred by her PCP, Dr. Moshe [name], for management of DM2, dx 1995, insulin-dependent since 1997, uncontrolled, with complications (gastroparesis, cerebrovasc. Ds-  H/o stroke, PN). Last visit 3 mo ago.  DM2: Last hemoglobin A1c was: Lab Results  Component Value Date   HGBA1C 7.3* 06/02/2014   HGBA1C 7.6* 03/11/2014   HGBA1C 8.3* 08/06/2013  10/28/2013: HbA1c 8.5%  She is now on: - Toujeo 50 units at bedtime - Novolog:  18 units before a small meal (B and L)  20 units before a regular meal  22 units before a large meal (D) Could not tolerate Metformin >> nausea.   Pt checks her sugars 4 a day and they are higher: - am: 153, 200-300, some 400s >> 68-142, 164 >> 89-139, 161 >> 88-147, 184 >> 98, 100-222, 310 - 2h after b'fast: n/c  - before lunch: 192-243 >> 78-192 >> 134-202 >> 90, 99-150, 198, 288 (forgot insulin) >> 84, 96-219, 309 - 2h after lunch: n/c - before dinner: 200-292 >> 55 x1, 77-174 >> 91, 100-201 >> 96, 125-201, 260 >> 98-301, 398 - 2h after dinner: n/c - bedtime: 190-200s, rarely 300s >> 72, 94-210 >> 90, 103-198 >> 101-193 >> 93, 160-218, 290, 398 - nighttime: n/c No lows. Lowest sugar was 69 >> 88x1 >> 84 x1; she has hypoglycemia awareness at 70.  Highest sugar was 200s >> 390s.  Pt's meals are: - Breakfast: eggs, cereals, fruit, oatmeal, bacon - Lunch: sandwich, beef, mashed potatoes,diet soda - Dinner: chicken, beef, fish, potatoes, vegetables - Snacks: 1-2: fruit  She exercises 1x a day - walks.  - + mild CKD, last BUN/creatinine:  Lab Results  Component Value Date   BUN 23 07/15/2014   CREATININE 0.90 07/15/2014  On Losartan. - last set of lipids: Lab Results  Component Value Date   CHOL 121 07/15/2014   HDL 39* 07/15/2014   LDLCALC 57 07/15/2014   TRIG 123 07/15/2014   CHOLHDL 3.1 07/15/2014  On Ezetimibe  + Crestor. - last eye exam was in 09/2013. No DR. Had cataract sx B. - + numbness and tingling in her leg L leg (affected by stroke) but also in R. Sees podiatry.  Hypothyroidism: - dx > 10 years ago. - On Levoxyl 50 mcg daily:  - in am (at 7 am) - fasting - with water - >30 min b'f b'fast - no Ca - no Fe - + MVI at noon - + Aciphex moved at noon  Last TSH: Lab Results  Component Value Date   TSH 1.10 04/10/2014  Previously: Lab Results  Component Value Date   TSH 1.453 08/02/2011   TSH 0.666 09/14/2009   TSH 0.295  05/30/2009   TSH 1.126 03/18/2009   TSH 0.104  09/15/2008   TSH 0.066  09/14/2008   TSH 1.041  02/03/2008   TSH 0.992 09/25/2007   TSH 0.257 08/25/2007   TSH 1.181 05/23/2006   She has a h/o thyroid nodules. + dysphagia, + hoarseness.  Reviewed thyroid U/S (10/2013):  Right thyroid lobe: 3.7 x 1.0 x 1.8 cm   Left thyroid lobe: 3.5 x 1.5 x 1.6 cm   Isthmus: 0.5 cm   Focal nodules: There is a 0.3 mm calcified nodule in the right midzone. There is a 0.6 x 0.7 x 0.5 cm hypoechoic nodule in the  lateral aspect of the  right midzone. There is a 0.7 x 0.6 x 0.5 cm hyperechoic nodule in the lateral aspect of the right lower pole.  There is a 2.2 x 1.1 x 1.7 cm complex solid nodule in the inferior aspect of the isthmus extending adjacent to the left lower pole.  There is a 1.4 x 2.1 x 1.4 cm complex solid nodule in the left lower pole.   Lymphadenopathy: None visualized.  Repeat U/S (01/2014): stable appearance of nodules  FNA (2014) x2: benign   ROS: Constitutional: no weight gain/loss, + fatigue, + subjective hypothermia, + nocturia Eyes: no blurry vision, no xerophthalmia ENT: no sore throat, no nodules palpated in throat, + dysphagia/no odynophagia, no hoarseness, + tinnitus Cardiovascular: no CP/SOB/palpitations/leg swelling Respiratory: no cough/SOB Gastrointestinal: + N/no V/D/C Musculoskeletal: no muscle/joint aches Skin: no rashes Neurological: no  tremors/numbness/tingling/dizziness  I reviewed pt's medications, allergies, PMH, social hx, family hx, and changes were documented in the history of present illness. Otherwise, unchanged from my initial visit note - except stopped Remeron, started Risperdal: Past Medical History  Diagnosis Date  . Bipolar disorder   . CVA (cerebral infarction)   . Pancreatitis 2006    due to Depakote with normal EUS   . Osteoporosis   . Chronic back pain   . Diabetes mellitus   . Trigger finger   . Anxiety disorder   . Hypertension   . Migraines     chronic headaches  . Diverticulosis     TCS 9/08 by Dr. Delfin Edis for diarrhea . Bx for micro scopic colitis negative.   . Schatzki's ring     non critical / EGD with ED 8/2011with RMR  . S/P colonoscopy 6606,3016, 2011    left-sided diverticula, hx of simple adenomas . 2011, random bx negative for microscopic colitis  . Glaucoma   . Allergic rhinitis   . Hypothyroidism     thyroid goiter  . Anemia   . Blood transfusion   . GERD (gastroesophageal reflux disease)   . Stroke     left sided weakness  . Seizures     unknown etiology-on meds-last seizure was 3 yerars ago  . Anxiety   . Depression   . Metabolic encephalopathy 0/12/9321  . Sleep apnea     on CPAP  . Arthritis   . Gum symptoms     infection on antibiotic  . Chronic neck pain   . Mononeuritis lower limb   . Frequent falls    Past Surgical History  Procedure Laterality Date  . Abdominal hysterectomy  1978  . Cholecystectomy  1984  . Ovarian cyst removal    . Carpal tunnel release Left 07/22/04    Dr. Aline Brochure  . Breast reduction surgery  1994  . Cataract extraction Bilateral   . Biopsy thyroid  2009  . Surgical excision of 3 tumors from right thigh and right buttock  and left upper thigh  2010  . Back surgery  July 2012  . Spine surgery  09/29/2010    Dr. Rolena Infante  . Maloney dilation  12/29/2010    RMR;  . Esophagogastroduodenoscopy  12/29/2010    Rourk-Retained food  in the esophagus and stomach, small hiatal hernia, status post Maloney dilation of the esophagus  . Craniotomy  11/23/2011    Procedure: CRANIOTOMY TUMOR EXCISION;  Surgeon: Hosie Spangle, MD;  Location: [facility];  Service: Neurosurgery;  Laterality: N/A;  Craniotomy for tumor resection  . Brain surgery  11/2011    resection of meningioma  .  Colonoscopy N/A 09/25/2012    DEY:CXKGYJE diverticulosis.  colonic polyp-removed : tubular adenoma  . Esophagogastroduodenoscopy N/A 09/25/2012    HUD:JSHFWYOV atonic baggy esophagus status post Maloney dilation 42 F. Hiatal hernia  . Givens capsule study N/A 01/15/2013    NORMAL.   . Bacterial overgrowth test N/A 05/05/2013    Procedure: BACTERIAL OVERGROWTH TEST;  Surgeon: Daneil Dolin, MD;  Location: [facility];  Service: Endoscopy;  Laterality: N/A;  7:30  . Transthoracic echocardiogram  2010    EF 60-65%, mild conc LVH, grade 1 diastolic dysfunction; mildly calcified MV annulus with mildly thickened leaflets, mildly calcified MR annulus  . Nm myocar perf wall motion  2006    "relavtiely normal" persantine, mild anterior thinning (breast attenuation artifact), no region of scar/ischemia  . Cardiac catheterization  05/10/2005    normal coronaries, normal LV systolic function and EF (Dr. Jackie Plum)   History   Social History  . Marital Status: Divorced    Spouse Name: N/A    Number of Children: 1  . Years of Education: 12   Occupational History  . disabled     Social History Main Topics  . Smoking status: Current Every Day Smoker -- 0.25 packs/day for 7 years    Types: Cigarettes  . Smokeless tobacco: Never Used     Comment: "started back but off now for 3 months" (08/18/13)  . Alcohol Use: No     Comment:    . Drug Use: No   Current Outpatient Prescriptions on File Prior to Visit  Medication Sig Dispense Refill  . aspirin EC 81 MG tablet Take 81 mg by mouth daily.    . Cholecalciferol (D 5000) 5000 UNITS capsule Take 5,000 Units  by mouth daily.    . cloNIDine (CATAPRES) 0.3 MG tablet TAKE 1 TABLET BY MOUTH EVERY EIGHT HOURS. ONCE AT 8AM, 4PM, AND 12 MIDNIGHT. 90 tablet 3  . diclofenac sodium (VOLTAREN) 1 % GEL Apply 2 g topically daily as needed (Pain).    [name] dicyclomine (BENTYL) 10 MG capsule Take 10 mg by mouth 3 (three) times daily before meals.     [name] glucose blood (ACCU-CHEK AVIVA PLUS) test strip Use to test blood sugar 4 times daily as instructed. Dx code: E10.65 400 each 3  . hydrochlorothiazide (HYDRODIURIL) 25 MG tablet Take 1 tablet (25 mg total) by mouth daily. 30 tablet 3  . HYDROmorphone (DILAUDID) 4 MG tablet Take 4 mg by mouth 3 (three) times daily as needed. For pain    . hydroxychloroquine (PLAQUENIL) 200 MG tablet Take 200 mg by mouth daily.    . insulin aspart (NOVOLOG FLEXPEN) 100 UNIT/ML FlexPen Inject 14-20 Units into the skin 3 (three) times daily with meals. (Patient taking differently: Inject 16 Units into the skin 3 (three) times daily with meals. ) 45 mL 1  . Insulin Glargine (LANTUS) 100 UNIT/ML Solostar Pen Inject 50 Units into the skin daily at 10 pm. 45 mL 1  . Insulin Pen Needle (UNIFINE PENTIPS) 31G X 6 MM MISC Use to inject insulin 4 times daily as directed. 390 each 3  .      . lamoTRIgine (LAMICTAL) 100 MG tablet Take 1.5 tablets (150 mg total) by mouth every morning. 45 tablet 5  . levothyroxine (LEVOXYL) 50 MCG tablet Take 50 mcg by mouth every morning.     [name] LORazepam (ATIVAN) 0.5 MG tablet Take 0.5 mg by mouth at bedtime.     [name] losartan (COZAAR) 100 MG  tablet TAKE 1 TABLET BY MOUTH ONCE DAILY. 30 tablet 3  . methocarbamol (ROBAXIN) 500 MG tablet Take 500 mg by mouth every morning.     . metoprolol (LOPRESSOR) 50 MG tablet TAKE 1 TABLET BY MOUTH TWICE DAILY. 60 tablet 5  . mirtazapine (REMERON) 30 MG tablet Take 30 mg by mouth at bedtime.     . mometasone-formoterol (DULERA) 100-5 MCG/ACT AERO Inhale 2 puffs into the lungs 2 (two) times daily. 1 Inhaler 0  . montelukast (SINGULAIR)  10 MG tablet TAKE ONE TABLET BY MOUTH ONCE DAILY. 30 tablet 2  . Multiple Vitamins-Minerals (ONE-A-DAY 50 PLUS PO) Take 1 tablet by mouth daily.    . pregabalin (LYRICA) 75 MG capsule Take 75 mg by mouth 2 (two) times daily.     . RABEprazole (ACIPHEX) 20 MG tablet TAKE 1 TABLET BY MOUTH TWICE DAILY. (Patient not taking: Reported on 03/03/2014) 60 tablet 5  . RABEprazole (ACIPHEX) 20 MG tablet Take 20 mg by mouth every morning.    . RESTASIS 0.05 % ophthalmic emulsion Place 1 drop into both eyes 2 (two) times daily.     . rosuvastatin (CRESTOR) 5 MG tablet TAKE 1 TABLET BY MOUTH AT BEDTIME. (Patient taking differently: Take 5 mg by mouth at bedtime. TAKE 1 TABLET BY MOUTH AT BEDTIME.) 30 tablet 3  . sertraline (ZOLOFT) 100 MG tablet Take 100 mg by mouth every morning.     [name] spironolactone (ALDACTONE) 25 MG tablet TAKE ONE TABLET BY MOUTH DAILY. 30 tablet 3  . terbinafine (LAMISIL) 250 MG tablet Take 1 tablet by mouth daily.    [name] thioridazine (MELLARIL) 10 MG tablet Take 20 mg by mouth at bedtime.     . traZODone (DESYREL) 50 MG tablet Take 50 mg by mouth at bedtime.     [name] ZETIA 10 MG tablet TAKE ONE TABLET BY MOUTH ONCE DAILY. 30 tablet 2   No current facility-administered medications on file prior to visit.   Allergies  Allergen Reactions  . Cephalexin Hives  . Iron Nausea And Vomiting  . Milk-Related Compounds Other (See Comments)    Doesn't agree with stomach.   . Penicillins Hives  . Phenazopyridine Hcl Other (See Comments)    [place]     Family History  Problem Relation Age of Onset  . Heart attack Mother     HTN  . Pneumonia Father   . Kidney failure Father   . Diabetes Father   . Pancreatic cancer Sister   . Diabetes Brother   . Hypertension Brother   . Diabetes Brother   . Colon cancer Neg Hx   . Anesthesia problems Neg Hx   . Hypotension Neg Hx   . Malignant hyperthermia Neg Hx   . Pseudochol deficiency Neg Hx   . Stroke Maternal Grandmother   . Heart attack  Maternal Grandfather   . Cancer Sister     breast   . Alcohol abuse Maternal Uncle   . Hypertension Brother    PE: BP 122/60 mmHg  Pulse 66  Temp(Src) 98 F (36.7 C) (Oral)  Resp 12  Wt 160 lb (72.576 kg)  SpO2 96% Body mass index is 32.3 kg/(m^2).  Wt Readings from Last 3 Encounters:  09/21/14 160 lb (72.576 kg)  09/07/14 161 lb 12.8 oz (73.392 kg)  09/01/14 163 lb 6.4 oz (74.118 kg)   Constitutional: overweight, in NAD but again appears somnolent Eyes: surgical pupils, EOMI, no exophthalmos ENT: moist mucous membranes, no thyromegaly, large thyroid nodule  felt in isthmus, no cervical lymphadenopathy Cardiovascular: RRR, No MRG Respiratory: CTA B Gastrointestinal: abdomen soft, NT, ND, BS+ Musculoskeletal: no deformities, strength intact in all 4 Skin: moist, warm, no rashes Neurological: no tremor with outstretched hands, DTR normal in all 4  ASSESSMENT: 1. DM2, insulin-dependent, uncontrolled, without complications - gastroparesis - cerebrovasc. Ds -  H/o stroke - PN  2. Hypothyroidism  3. MNG - Thyroid U/S (10/11/2012):  Right thyroid lobe: 3.7 x 1.0 x 1.8 cm  Left thyroid lobe: 3.5 x 1.5 x 1.6 cm  Isthmus: 0.5 cm  Focal nodules: There is a 0.3 mm calcified nodule in the right midzone. There is a 0.6 x 0.7 x 0.5 cm hypoechoic nodule in the lateral aspect of the right midzone. There is a 0.7 x 0.6 x 0.5 cm hyperechoic nodule in the lateral aspect of the right lower pole. There is a 2.2 x 1.1 x 1.7 cm complex solid nodule in the inferior aspect of the isthmus extending adjacent to the left lower pole. There is a 1.4 x 2.1 x 1.4 cm complex solid nodule in the left lower pole.  Lymphadenopathy: None visualized.  IMPRESSION: Multi nodular goiter which has markedly progressed since the prior exam. The dominant nodule at the inferior aspect of the isthmus fits criteria for fine needle aspiration biopsy if not previously Assessed.  - FNA (11/05/2012)  x2: benign  - Thyroid U/S (01/20/2014) - felt one nodule enlarging >> new U/S: stable appearance of the nodules, except a small new nodule in isthmus: 1.2 cm   Right thyroid lobe Measurements: 4.4 cm x 1.2 cm x 1.7 cm. Multiple right-sided nodules identified. Each right-sided nodule demonstrates increased echogenicity, with the superior measuring 7 mm -8 mm, most inferior measuring 9 mm - 10 mm. A small, 3 mm focus of calcium with posterior shadowing is evident. There is also a mid nodule measuring 7 mm, which is echogenic.  Left thyroid lobe Measurements: 5.1 cm x 2.1 cm x 2.3 cm. Dominant lesion at the inferior pole of left thyroid is again evident, which has been previously biopsied (10/29/2012). This nodule measures 1.8 cm x 1.7cm x 2.5 cm. (Previous 1.4 cm x 2.1 cm x 1.4 cm)  Isthmus Thickness: 4 mm-5 mm. Isthmic nodule again noted, previously biopsied (10/29/2012). Currently this nodule measures 2.2 cm x 1.4 cm x 1.8 cm. (previous measurement 2.2 cm x 1.1 cm x 1.7 cm). There is a new nodule identified within the isthmus with heterogeneously hyperechoic characteristics. This nodule measures 12 mm x 5.4 mm x 7.2 mm.  Lymphadenopathy: None visualized.   PLAN:  1. Patient with long-standing, uncontrolled diabetes, on basal-bolus regimen, with worsening sugars again. Still some high sugars before lunch and dinner >> will increase a little the Toujeo to 55: Patient Instructions  Please increase: - Toujeo to 55 units at bedtime Continue: - Novolog:  20 units before a regular meal  22 units before a large meal   Please return in 3 month with your sugar log.   - continue checking sugars at different times of the day - check 3-4 times a day, rotating checks - advised for yearly eye exams >> she is UTD - check A1c today - Return to clinic in 3 mo with sugar log   2. Hypothyroidism - latest TFTs normal 6 mo ago - we discussed how to take the thyroid hormone every day, with water, >30  minutes before breakfast, separated by >4 hours from acid reflux medications, calcium, iron, multivitamins. We  have moved Aciphex from am to later in the day >> now taking the LT4 correctly. - continue LT4 50 mcg daily - will recheck TFTs today  3. MNG - previously had 2 normal Bx's (2014). Reviewed the ultrasounds from 2014 in 2015. - will need a new U/S in 1-2 years from previous (01/2014).  Office Visit on 09/21/2014  Component Date Value Ref Range Status  . TSH 09/21/2014 0.28* 0.35 - 4.50 uIU/mL Final  . Free T4 09/21/2014 0.73  0.60 - 1.60 ng/dL Final  . Hgb A1c MFr Bld 09/21/2014 7.0* 4.6 - 6.5 % Final   Glycemic Control Guidelines for People with Diabetes:Non Diabetic:  <6%Goal of Therapy: <7%Additional Action Suggested:  >8%    Excellent hemoglobin A1c! TSH slightly low, possibly because of separating levothyroxine from Aciphex. I will ask her to take only half of her levothyroxine 50 g tablet on Sundays. We'll repeat her thyroid tests at next visit.

## 2014-09-22 ENCOUNTER — Telehealth: Payer: Self-pay | Admitting: *Deleted

## 2014-09-22 DIAGNOSIS — E119 Type 2 diabetes mellitus without complications: Secondary | ICD-10-CM | POA: Diagnosis not present

## 2014-09-22 DIAGNOSIS — M5440 Lumbago with sciatica, unspecified side: Secondary | ICD-10-CM

## 2014-09-22 DIAGNOSIS — F209 Schizophrenia, unspecified: Secondary | ICD-10-CM | POA: Diagnosis not present

## 2014-09-22 DIAGNOSIS — Z9181 History of falling: Secondary | ICD-10-CM | POA: Diagnosis not present

## 2014-09-22 DIAGNOSIS — I10 Essential (primary) hypertension: Secondary | ICD-10-CM | POA: Diagnosis not present

## 2014-09-22 DIAGNOSIS — M5010 Cervical disc disorder with radiculopathy, unspecified cervical region: Secondary | ICD-10-CM

## 2014-09-22 DIAGNOSIS — E039 Hypothyroidism, unspecified: Secondary | ICD-10-CM | POA: Diagnosis not present

## 2014-09-22 DIAGNOSIS — Z794 Long term (current) use of insulin: Secondary | ICD-10-CM | POA: Diagnosis not present

## 2014-09-22 DIAGNOSIS — F319 Bipolar disorder, unspecified: Secondary | ICD-10-CM | POA: Diagnosis not present

## 2014-09-22 NOTE — Telephone Encounter (Signed)
Pt called stating she needs a referral sent to Dr. Nelva Bush he is her othro/ pain management doctor.

## 2014-09-25 ENCOUNTER — Telehealth (HOSPITAL_COMMUNITY): Payer: Self-pay | Admitting: *Deleted

## 2014-09-28 DIAGNOSIS — E039 Hypothyroidism, unspecified: Secondary | ICD-10-CM | POA: Diagnosis not present

## 2014-09-28 DIAGNOSIS — F209 Schizophrenia, unspecified: Secondary | ICD-10-CM | POA: Diagnosis not present

## 2014-09-28 DIAGNOSIS — Z794 Long term (current) use of insulin: Secondary | ICD-10-CM | POA: Diagnosis not present

## 2014-09-28 DIAGNOSIS — E119 Type 2 diabetes mellitus without complications: Secondary | ICD-10-CM | POA: Diagnosis not present

## 2014-09-28 DIAGNOSIS — I10 Essential (primary) hypertension: Secondary | ICD-10-CM | POA: Diagnosis not present

## 2014-09-28 DIAGNOSIS — Z9181 History of falling: Secondary | ICD-10-CM | POA: Diagnosis not present

## 2014-09-28 DIAGNOSIS — F319 Bipolar disorder, unspecified: Secondary | ICD-10-CM | POA: Diagnosis not present

## 2014-09-28 NOTE — Telephone Encounter (Signed)
Referral entered  

## 2014-09-28 NOTE — Telephone Encounter (Signed)
He is in [employer], Pt said she has been going to him for a while, pt said Dr. Nelva Bush office told her she needs another referral before scheduling her appt.

## 2014-09-30 ENCOUNTER — Telehealth (HOSPITAL_COMMUNITY): Payer: Self-pay | Admitting: *Deleted

## 2014-10-02 NOTE — Progress Notes (Signed)
Please apologize to patient but we never received the addendum to BPE after she went back to swallow pill. Was following up on chart today and saw it. Barium tablet passed without obstruction. Keep OV as planned.

## 2014-10-05 DIAGNOSIS — F319 Bipolar disorder, unspecified: Secondary | ICD-10-CM | POA: Diagnosis not present

## 2014-10-05 DIAGNOSIS — I10 Essential (primary) hypertension: Secondary | ICD-10-CM | POA: Diagnosis not present

## 2014-10-05 DIAGNOSIS — E039 Hypothyroidism, unspecified: Secondary | ICD-10-CM | POA: Diagnosis not present

## 2014-10-05 DIAGNOSIS — Z9181 History of falling: Secondary | ICD-10-CM | POA: Diagnosis not present

## 2014-10-05 DIAGNOSIS — Z794 Long term (current) use of insulin: Secondary | ICD-10-CM | POA: Diagnosis not present

## 2014-10-05 DIAGNOSIS — F209 Schizophrenia, unspecified: Secondary | ICD-10-CM | POA: Diagnosis not present

## 2014-10-05 DIAGNOSIS — E119 Type 2 diabetes mellitus without complications: Secondary | ICD-10-CM | POA: Diagnosis not present

## 2014-10-06 DIAGNOSIS — E109 Type 1 diabetes mellitus without complications: Secondary | ICD-10-CM | POA: Diagnosis not present

## 2014-10-06 DIAGNOSIS — I10 Essential (primary) hypertension: Secondary | ICD-10-CM | POA: Diagnosis not present

## 2014-10-06 DIAGNOSIS — Z0100 Encounter for examination of eyes and vision without abnormal findings: Secondary | ICD-10-CM | POA: Diagnosis not present

## 2014-10-06 LAB — HM DIABETES EYE EXAM

## 2014-10-08 ENCOUNTER — Telehealth: Payer: Self-pay | Admitting: *Deleted

## 2014-10-08 DIAGNOSIS — L603 Nail dystrophy: Secondary | ICD-10-CM | POA: Diagnosis not present

## 2014-10-08 DIAGNOSIS — E1051 Type 1 diabetes mellitus with diabetic peripheral angiopathy without gangrene: Secondary | ICD-10-CM | POA: Diagnosis not present

## 2014-10-08 DIAGNOSIS — B351 Tinea unguium: Secondary | ICD-10-CM | POA: Diagnosis not present

## 2014-10-08 DIAGNOSIS — I739 Peripheral vascular disease, unspecified: Secondary | ICD-10-CM | POA: Diagnosis not present

## 2014-10-08 DIAGNOSIS — L84 Corns and callosities: Secondary | ICD-10-CM | POA: Diagnosis not present

## 2014-10-08 NOTE — Telephone Encounter (Signed)
Pt called stating she called the nurse line last night and they told her she needed to see [name], pt wants to speak with [name] about it and see if it is worth coming

## 2014-10-09 NOTE — Telephone Encounter (Signed)
Spoke with patient and [employer] on the line. Appt made

## 2014-10-12 DIAGNOSIS — E119 Type 2 diabetes mellitus without complications: Secondary | ICD-10-CM | POA: Diagnosis not present

## 2014-10-12 DIAGNOSIS — Z9181 History of falling: Secondary | ICD-10-CM | POA: Diagnosis not present

## 2014-10-12 DIAGNOSIS — E039 Hypothyroidism, unspecified: Secondary | ICD-10-CM | POA: Diagnosis not present

## 2014-10-12 DIAGNOSIS — Z794 Long term (current) use of insulin: Secondary | ICD-10-CM | POA: Diagnosis not present

## 2014-10-12 DIAGNOSIS — F319 Bipolar disorder, unspecified: Secondary | ICD-10-CM | POA: Diagnosis not present

## 2014-10-12 DIAGNOSIS — I10 Essential (primary) hypertension: Secondary | ICD-10-CM | POA: Diagnosis not present

## 2014-10-12 DIAGNOSIS — F209 Schizophrenia, unspecified: Secondary | ICD-10-CM | POA: Diagnosis not present

## 2014-10-13 ENCOUNTER — Ambulatory Visit: Payer: Self-pay | Admitting: Family Medicine

## 2014-10-13 ENCOUNTER — Encounter: Payer: Self-pay | Admitting: *Deleted

## 2014-10-14 DIAGNOSIS — Z0100 Encounter for examination of eyes and vision without abnormal findings: Secondary | ICD-10-CM | POA: Diagnosis not present

## 2014-10-14 NOTE — Progress Notes (Signed)
Tried to call pt- NA 

## 2014-10-19 DIAGNOSIS — Z9181 History of falling: Secondary | ICD-10-CM | POA: Diagnosis not present

## 2014-10-19 DIAGNOSIS — F319 Bipolar disorder, unspecified: Secondary | ICD-10-CM | POA: Diagnosis not present

## 2014-10-19 DIAGNOSIS — E119 Type 2 diabetes mellitus without complications: Secondary | ICD-10-CM | POA: Diagnosis not present

## 2014-10-19 DIAGNOSIS — Z794 Long term (current) use of insulin: Secondary | ICD-10-CM | POA: Diagnosis not present

## 2014-10-19 DIAGNOSIS — I10 Essential (primary) hypertension: Secondary | ICD-10-CM | POA: Diagnosis not present

## 2014-10-19 DIAGNOSIS — M5440 Lumbago with sciatica, unspecified side: Secondary | ICD-10-CM | POA: Diagnosis not present

## 2014-10-19 DIAGNOSIS — E039 Hypothyroidism, unspecified: Secondary | ICD-10-CM | POA: Diagnosis not present

## 2014-10-19 DIAGNOSIS — F209 Schizophrenia, unspecified: Secondary | ICD-10-CM | POA: Diagnosis not present

## 2014-10-20 ENCOUNTER — Ambulatory Visit (INDEPENDENT_AMBULATORY_CARE_PROVIDER_SITE_OTHER): Payer: Commercial Managed Care - HMO | Admitting: Family Medicine

## 2014-10-20 ENCOUNTER — Encounter: Payer: Self-pay | Admitting: Family Medicine

## 2014-10-20 VITALS — BP 110/60 | HR 64 | Resp 16 | Ht 59.0 in | Wt 157.0 lb

## 2014-10-20 DIAGNOSIS — R569 Unspecified convulsions: Secondary | ICD-10-CM

## 2014-10-20 DIAGNOSIS — G43009 Migraine without aura, not intractable, without status migrainosus: Secondary | ICD-10-CM

## 2014-10-20 DIAGNOSIS — R42 Dizziness and giddiness: Secondary | ICD-10-CM | POA: Diagnosis not present

## 2014-10-20 DIAGNOSIS — E109 Type 1 diabetes mellitus without complications: Secondary | ICD-10-CM | POA: Diagnosis not present

## 2014-10-20 DIAGNOSIS — I10 Essential (primary) hypertension: Secondary | ICD-10-CM

## 2014-10-20 DIAGNOSIS — E1140 Type 2 diabetes mellitus with diabetic neuropathy, unspecified: Secondary | ICD-10-CM

## 2014-10-20 DIAGNOSIS — E785 Hyperlipidemia, unspecified: Secondary | ICD-10-CM

## 2014-10-20 DIAGNOSIS — R296 Repeated falls: Secondary | ICD-10-CM | POA: Diagnosis not present

## 2014-10-20 DIAGNOSIS — N39498 Other specified urinary incontinence: Secondary | ICD-10-CM

## 2014-10-20 DIAGNOSIS — E1065 Type 1 diabetes mellitus with hyperglycemia: Secondary | ICD-10-CM | POA: Diagnosis not present

## 2014-10-20 DIAGNOSIS — R0989 Other specified symptoms and signs involving the circulatory and respiratory systems: Secondary | ICD-10-CM

## 2014-10-20 MED ORDER — NYAMYC 100000 UNIT/GM EX POWD: CUTANEOUS | Status: DC

## 2014-10-20 NOTE — Progress Notes (Signed)
Letter mailed to the pt. 

## 2014-10-20 NOTE — Patient Instructions (Addendum)
F/u as before in October, call for flu vaccine in September  You are referred to neurologist as well as for [name] of neck arteries to ensure you have no blockage in neck arteries   Be careful not to fall. No sudden movement  Script for depends provided and nystatin refilled  No changes in medicaction

## 2014-10-20 NOTE — Progress Notes (Signed)
Tried to call pt- NA 

## 2014-10-20 NOTE — Progress Notes (Signed)
Subjective:    Patient ID: [name], female    DOB: 21-Feb-1951, 64 y.o.   MRN: [id number]  HPI Recurrent lifght hadeness with falls approx 3 in the past 2 week, "spells come on her" C/o problems with wetting on herself and is requesting supplies for this be approved, which she has had in the past   [name]     MRN: [id number]      DOB: 12/08/1950   HPI [name] is here for follow up and re-evaluation of chronic medical conditions, medication management and review of any available recent lab and radiology data.  Preventive health is updated, specifically  Cancer screening and Immunization.   Questions or concerns regarding consultations or procedures which the PT has had in the interim are  addressed. The PT denies any adverse reactions to current medications since the last visit.  Denies polyuria, polydipsia, blurred vision , or hypoglycemic episodes.   ROS Denies recent fever or chills. Denies sinus pressure, nasal congestion, ear pain or sore throat. Denies chest congestion, productive cough or wheezing. Denies chest pains, palpitations and leg swelling Denies abdominal pain, nausea, vomiting,diarrhea or constipation.   Denies dysuria, frequency, hesitancy Denies headaches, seizures, Denies uncontrolled depression, anxiety or insomnia. Denies skin break down or rash.   PE  BP 110/60 mmHg  Pulse 64  Resp 16  Ht 4\' 11"  (1.499 m)  Wt 157 lb (71.215 kg)  BMI 31.69 kg/m2  SpO2 95%  Patient alert and oriented and in no cardiopulmonary distress.  HEENT: No facial asymmetry, EOMI,   oropharynx pink and moist.  Neck supple no JVD, no mass.Bilateral bruits  Chest: Clear to auscultation bilaterally.  CVS: S1, S2 no murmurs, no S3.Regular rate.  ABD: Soft non tender.   Ext: No edema  MS: Decreased  ROM spine, shoulders, hips and knees.  Skin: Intact, no ulcerations or rash noted.  Psych: Good eye contact, normal affect. Memory intact not anxious or depressed  appearing.  CNS: CN 2-12 intact, power,  normal throughout.no focal deficits noted.   Assessment & Plan   Light headedness Recurrent light headedness with imbalance and falls, imaging of carotid arteries neeeded  Urinary incontinence Uncontrolled , despite medication, needs supplies to assist with the problem  Falls frequently High fall risk with repeated falls, , home safety reviewed, may require PT again, she has had this in the past  Essential hypertension Controlled, no change in medication DASH diet and commitment to daily physical activity for a minimum of 30 minutes discussed and encouraged, as a part of hypertension management. The importance of attaining a healthy weight is also discussed.  BP/Weight 11/10/2014 11/05/2014 11/03/2014 11/03/2014 10/20/2014 09/21/2014 0/34/7425  Systolic BP 956 387 564 90 332 951 884  Diastolic BP 68 68 166 50 60 60 70  Wt. (Lbs) 156.12 159.2 155 155.5 157 160 161.8  BMI 27.66 28.21 31.29 31.39 31.69 32.3 32.66  Some encounter information is confidential and restricted. Go to Review Flowsheets activity to see all data.        Migraine without aura and without status migrainosus, not intractable No recent flare adequately controlled  Hyperlipidemia Hyperlipidemia:Low fat diet discussed and encouraged.   Lipid Panel  Lab Results  Component Value Date   CHOL 121 07/15/2014   HDL 39* 07/15/2014   LDLCALC 57 07/15/2014   TRIG 123 07/15/2014   CHOLHDL 3.1 07/15/2014      Controlled, no change in medication   Bipolar disorder Stable, managed by  psychiatry  Backache Chronic pain management through pain clinic  Diabetes mellitus, insulin dependent (IDDM), uncontrolled Managed by endo [name] is reminded of the importance of commitment to daily physical activity for 30 minutes or more, as able and the need to limit carbohydrate intake to 30 to 60 grams per meal to help with blood sugar control.   The need to take medication as  prescribed, test blood sugar as directed, and to call between visits if there is a concern that blood sugar is uncontrolled is also discussed.   [name] is reminded of the importance of daily foot exam, annual eye examination, and good blood sugar, blood pressure and cholesterol control.  Diabetic Labs Latest Ref Rng 11/09/2014 11/03/2014 11/03/2014 11/03/2014 10/20/2014  HbA1c 4.6 - 6.5 % - - - - -  Microalbumin <2.0 mg/dL - - - - 1.3  Micro/Creat Ratio 0.0 - 30.0 mg/g - - - - 4.8  Chol 0 - 200 mg/dL - - - - -  HDL >=46 mg/dL - - - - -  Calc LDL 0 - 99 mg/dL - - - - -  Triglycerides <150 mg/dL - - - - -  Creatinine 0.50 - 0.99 mg/dL 1.05(H) 2.27(H) 2.85(H) 2.79(H) -  GFR >60.00 mL/min - - - 21.94(L) -   BP/Weight 11/10/2014 11/05/2014 11/03/2014 11/03/2014 10/20/2014 09/21/2014 08/27/8370  Systolic BP 902 111 552 90 080 223 361  Diastolic BP 68 68 224 50 60 60 70  Wt. (Lbs) 156.12 159.2 155 155.5 157 160 161.8  BMI 27.66 28.21 31.29 31.39 31.69 32.3 32.66  Some encounter information is confidential and restricted. Go to Review Flowsheets activity to see all data.   Foot/eye exam completion dates Latest Ref Rng 10/06/2014 10/03/2013  Eye Exam No Retinopathy No Retinopathy No Retinopathy  Foot exam Order - - -  Foot Form Completion - - -               Review of Systems     Objective:   Physical Exam        Assessment & Plan:

## 2014-10-21 ENCOUNTER — Ambulatory Visit: Payer: Self-pay | Admitting: Gastroenterology

## 2014-10-21 ENCOUNTER — Ambulatory Visit: Payer: Commercial Managed Care - HMO | Admitting: Gastroenterology

## 2014-10-21 DIAGNOSIS — Z79891 Long term (current) use of opiate analgesic: Secondary | ICD-10-CM | POA: Diagnosis not present

## 2014-10-21 DIAGNOSIS — G894 Chronic pain syndrome: Secondary | ICD-10-CM | POA: Diagnosis not present

## 2014-10-21 DIAGNOSIS — M961 Postlaminectomy syndrome, not elsewhere classified: Secondary | ICD-10-CM | POA: Diagnosis not present

## 2014-10-21 DIAGNOSIS — M5090 Cervical disc disorder, unspecified, unspecified cervical region: Secondary | ICD-10-CM | POA: Diagnosis not present

## 2014-10-21 LAB — MICROALBUMIN / CREATININE URINE RATIO
Creatinine, Urine: 271.8 mg/dL
MICROALB UR: 1.3 mg/dL (ref <2.0)
Microalb Creat Ratio: 4.8 mg/g (ref 0.0–30.0)

## 2014-10-22 ENCOUNTER — Telehealth: Payer: Self-pay | Admitting: Family Medicine

## 2014-10-22 NOTE — Telephone Encounter (Signed)
[name] is calling asking for [name], she will not give me any indication what it is in regards to.

## 2014-10-23 DIAGNOSIS — I69359 Hemiplegia and hemiparesis following cerebral infarction affecting unspecified side: Secondary | ICD-10-CM | POA: Diagnosis not present

## 2014-10-23 DIAGNOSIS — E119 Type 2 diabetes mellitus without complications: Secondary | ICD-10-CM | POA: Diagnosis not present

## 2014-10-23 NOTE — Telephone Encounter (Signed)
Wanted her bedside commode and her shower rail sent in.

## 2014-10-26 ENCOUNTER — Ambulatory Visit: Payer: Self-pay | Admitting: Neurology

## 2014-10-26 ENCOUNTER — Ambulatory Visit (HOSPITAL_COMMUNITY): Payer: Self-pay | Admitting: Psychiatry

## 2014-10-28 ENCOUNTER — Telehealth: Payer: Self-pay | Admitting: Family Medicine

## 2014-10-28 DIAGNOSIS — F209 Schizophrenia, unspecified: Secondary | ICD-10-CM | POA: Diagnosis not present

## 2014-10-28 DIAGNOSIS — F319 Bipolar disorder, unspecified: Secondary | ICD-10-CM | POA: Diagnosis not present

## 2014-10-28 DIAGNOSIS — Z794 Long term (current) use of insulin: Secondary | ICD-10-CM | POA: Diagnosis not present

## 2014-10-28 DIAGNOSIS — I10 Essential (primary) hypertension: Secondary | ICD-10-CM | POA: Diagnosis not present

## 2014-10-28 DIAGNOSIS — Z9181 History of falling: Secondary | ICD-10-CM | POA: Diagnosis not present

## 2014-10-28 DIAGNOSIS — E039 Hypothyroidism, unspecified: Secondary | ICD-10-CM | POA: Diagnosis not present

## 2014-10-28 DIAGNOSIS — E119 Type 2 diabetes mellitus without complications: Secondary | ICD-10-CM | POA: Diagnosis not present

## 2014-10-28 NOTE — Telephone Encounter (Signed)
pls schedule carotid US for this pt,  Also arrange neurology appt requested in April  She will need an appt here after the [name] as pre op clearance exam needed for oral surgery also pls  ?? pls ask!

## 2014-10-28 NOTE — Telephone Encounter (Signed)
Pt called in and has a request for clearance for dental surgery. She recently saw cardiology so that is good At lat visit she was referred for carotid doppler for recurrent light headedness needs this done, also mentioned needed to see neurologist, referral was placed since April, still no appt scheduled , explain this Robbie Lis , and I also sending this message to referral staff so that pt can get at least  The carotid doppler so I can follow through with the clearance  She will also need an oV specifically for pre op history and exam based on request of dentist so that needs to be scheduled here also explain and let her know pls  I will send a separate message to front staf to follow through on referrals ju and set appt , just explain to Kyia what is happening pls

## 2014-10-29 NOTE — Telephone Encounter (Signed)
dopplar scheduled and msg left for patient to call in to schedule appt next week

## 2014-10-29 NOTE — Telephone Encounter (Signed)
Pt has a appt 11/03/14 with Dr. Tomi Likens

## 2014-10-29 NOTE — Telephone Encounter (Signed)
Pt has a appt with [name] 11/10/14 at 10:45. Pt is aware of all of her appts.

## 2014-10-29 NOTE — Telephone Encounter (Signed)
Pt scheduled for appt and [name] next week

## 2014-10-29 NOTE — Telephone Encounter (Signed)
Pt has a appt with AP 11/06/14 at 10:00.

## 2014-11-03 ENCOUNTER — Emergency Department (HOSPITAL_COMMUNITY)
Admission: EM | Admit: 2014-11-03 | Discharge: 2014-11-03 | Disposition: A | Discharge status: Home / Self Care | Payer: Commercial Managed Care - HMO | Attending: Emergency Medicine | Admitting: Emergency Medicine

## 2014-11-03 ENCOUNTER — Telehealth: Payer: Self-pay | Admitting: *Deleted

## 2014-11-03 ENCOUNTER — Encounter (HOSPITAL_COMMUNITY): Payer: Self-pay | Admitting: *Deleted

## 2014-11-03 ENCOUNTER — Other Ambulatory Visit (INDEPENDENT_AMBULATORY_CARE_PROVIDER_SITE_OTHER): Payer: Commercial Managed Care - HMO

## 2014-11-03 ENCOUNTER — Encounter: Payer: Self-pay | Admitting: Neurology

## 2014-11-03 ENCOUNTER — Ambulatory Visit (INDEPENDENT_AMBULATORY_CARE_PROVIDER_SITE_OTHER): Payer: Commercial Managed Care - HMO | Admitting: Neurology

## 2014-11-03 ENCOUNTER — Telehealth: Payer: Self-pay | Admitting: Family Medicine

## 2014-11-03 ENCOUNTER — Telehealth: Payer: Self-pay | Admitting: Neurology

## 2014-11-03 VITALS — BP 90/50 | HR 58 | Resp 14 | Ht 59.0 in | Wt 155.5 lb

## 2014-11-03 DIAGNOSIS — Z87738 Personal history of other specified (corrected) congenital malformations of digestive system: Secondary | ICD-10-CM | POA: Insufficient documentation

## 2014-11-03 DIAGNOSIS — E860 Dehydration: Secondary | ICD-10-CM | POA: Diagnosis not present

## 2014-11-03 DIAGNOSIS — Z862 Personal history of diseases of the blood and blood-forming organs and certain disorders involving the immune mechanism: Secondary | ICD-10-CM | POA: Insufficient documentation

## 2014-11-03 DIAGNOSIS — G40109 Localization-related (focal) (partial) symptomatic epilepsy and epileptic syndromes with simple partial seizures, not intractable, without status epilepticus: Secondary | ICD-10-CM

## 2014-11-03 DIAGNOSIS — M545 Low back pain: Secondary | ICD-10-CM | POA: Insufficient documentation

## 2014-11-03 DIAGNOSIS — K219 Gastro-esophageal reflux disease without esophagitis: Secondary | ICD-10-CM | POA: Insufficient documentation

## 2014-11-03 DIAGNOSIS — Z86011 Personal history of benign neoplasm of the brain: Secondary | ICD-10-CM

## 2014-11-03 DIAGNOSIS — Z79899 Other long term (current) drug therapy: Secondary | ICD-10-CM

## 2014-11-03 DIAGNOSIS — R208 Other disturbances of skin sensation: Secondary | ICD-10-CM

## 2014-11-03 DIAGNOSIS — R531 Weakness: Secondary | ICD-10-CM | POA: Diagnosis not present

## 2014-11-03 DIAGNOSIS — G4739 Other sleep apnea: Secondary | ICD-10-CM | POA: Diagnosis not present

## 2014-11-03 DIAGNOSIS — R200 Anesthesia of skin: Secondary | ICD-10-CM

## 2014-11-03 DIAGNOSIS — G43909 Migraine, unspecified, not intractable, without status migrainosus: Secondary | ICD-10-CM | POA: Diagnosis not present

## 2014-11-03 DIAGNOSIS — Z880 Allergy status to penicillin: Secondary | ICD-10-CM | POA: Diagnosis not present

## 2014-11-03 DIAGNOSIS — R400 Somnolence: Secondary | ICD-10-CM

## 2014-11-03 DIAGNOSIS — F419 Anxiety disorder, unspecified: Secondary | ICD-10-CM | POA: Diagnosis not present

## 2014-11-03 DIAGNOSIS — E039 Hypothyroidism, unspecified: Secondary | ICD-10-CM | POA: Diagnosis not present

## 2014-11-03 DIAGNOSIS — N179 Acute kidney failure, unspecified: Secondary | ICD-10-CM | POA: Insufficient documentation

## 2014-11-03 DIAGNOSIS — Z7982 Long term (current) use of aspirin: Secondary | ICD-10-CM | POA: Insufficient documentation

## 2014-11-03 DIAGNOSIS — M1990 Unspecified osteoarthritis, unspecified site: Secondary | ICD-10-CM | POA: Diagnosis not present

## 2014-11-03 DIAGNOSIS — R4189 Other symptoms and signs involving cognitive functions and awareness: Secondary | ICD-10-CM

## 2014-11-03 DIAGNOSIS — G8929 Other chronic pain: Secondary | ICD-10-CM | POA: Diagnosis not present

## 2014-11-03 DIAGNOSIS — I10 Essential (primary) hypertension: Secondary | ICD-10-CM | POA: Diagnosis not present

## 2014-11-03 DIAGNOSIS — F319 Bipolar disorder, unspecified: Secondary | ICD-10-CM | POA: Insufficient documentation

## 2014-11-03 DIAGNOSIS — K068 Other specified disorders of gingiva and edentulous alveolar ridge: Secondary | ICD-10-CM | POA: Diagnosis not present

## 2014-11-03 DIAGNOSIS — R296 Repeated falls: Secondary | ICD-10-CM

## 2014-11-03 DIAGNOSIS — Z8673 Personal history of transient ischemic attack (TIA), and cerebral infarction without residual deficits: Secondary | ICD-10-CM | POA: Diagnosis not present

## 2014-11-03 DIAGNOSIS — Z9889 Other specified postprocedural states: Secondary | ICD-10-CM | POA: Insufficient documentation

## 2014-11-03 DIAGNOSIS — E119 Type 2 diabetes mellitus without complications: Secondary | ICD-10-CM | POA: Diagnosis not present

## 2014-11-03 DIAGNOSIS — IMO0002 Reserved for concepts with insufficient information to code with codable children: Secondary | ICD-10-CM

## 2014-11-03 DIAGNOSIS — Z8669 Personal history of other diseases of the nervous system and sense organs: Secondary | ICD-10-CM

## 2014-11-03 DIAGNOSIS — G8191 Hemiplegia, unspecified affecting right dominant side: Secondary | ICD-10-CM

## 2014-11-03 DIAGNOSIS — Z87891 Personal history of nicotine dependence: Secondary | ICD-10-CM | POA: Diagnosis not present

## 2014-11-03 DIAGNOSIS — G40909 Epilepsy, unspecified, not intractable, without status epilepticus: Secondary | ICD-10-CM | POA: Diagnosis not present

## 2014-11-03 DIAGNOSIS — Z794 Long term (current) use of insulin: Secondary | ICD-10-CM | POA: Insufficient documentation

## 2014-11-03 DIAGNOSIS — I69359 Hemiplegia and hemiparesis following cerebral infarction affecting unspecified side: Secondary | ICD-10-CM | POA: Diagnosis not present

## 2014-11-03 DIAGNOSIS — Z7951 Long term (current) use of inhaled steroids: Secondary | ICD-10-CM | POA: Insufficient documentation

## 2014-11-03 LAB — COMPREHENSIVE METABOLIC PANEL
ALT: 32 U/L (ref 0–35)
AST: 35 U/L (ref 0–37)
Albumin: 3.8 g/dL (ref 3.5–5.2)
Alkaline Phosphatase: 112 U/L (ref 39–117)
BUN: 63 mg/dL — ABNORMAL HIGH (ref 6–23)
CO2: 26 meq/L (ref 19–32)
CREATININE: 2.79 mg/dL — AB (ref 0.40–1.20)
Calcium: 9.8 mg/dL (ref 8.4–10.5)
Chloride: 102 mEq/L (ref 96–112)
GFR: 21.94 mL/min — AB (ref >60.00)
Glucose, Bld: 197 mg/dL — ABNORMAL HIGH (ref 70–99)
Potassium: 4 mEq/L (ref 3.5–5.1)
Sodium: 137 mEq/L (ref 135–145)
Total Bilirubin: 0.4 mg/dL (ref 0.2–1.2)
Total Protein: 7 g/dL (ref 6.0–8.3)

## 2014-11-03 LAB — BASIC METABOLIC PANEL
ANION GAP: 9 (ref 5–15)
ANION GAP: 7 (ref 5–15)
BUN: 72 mg/dL — ABNORMAL HIGH (ref 6–20)
BUN: 60 mg/dL — ABNORMAL HIGH (ref 6–20)
CALCIUM: 8.3 mg/dL — AB (ref 8.9–10.3)
CHLORIDE: 110 mmol/L (ref 101–111)
CO2: 23 mmol/L (ref 22–32)
CO2: 21 mmol/L — AB (ref 22–32)
Calcium: 9.1 mg/dL (ref 8.9–10.3)
Chloride: 105 mmol/L (ref 101–111)
Creatinine, Ser: 2.85 mg/dL — ABNORMAL HIGH (ref 0.44–1.00)
Creatinine, Ser: 2.27 mg/dL — ABNORMAL HIGH (ref 0.44–1.00)
GFR calc non Af Amer: 16 mL/min — ABNORMAL LOW (ref >60)
GFR calc non Af Amer: 22 mL/min — ABNORMAL LOW (ref >60)
GFR, EST AFRICAN AMERICAN: 19 mL/min — AB (ref >60)
GFR, EST AFRICAN AMERICAN: 25 mL/min — AB (ref >60)
Glucose, Bld: 241 mg/dL — ABNORMAL HIGH (ref 65–99)
Glucose, Bld: 181 mg/dL — ABNORMAL HIGH (ref 65–99)
POTASSIUM: 4.5 mmol/L (ref 3.5–5.1)
POTASSIUM: 4.2 mmol/L (ref 3.5–5.1)
Sodium: 137 mmol/L (ref 135–145)
Sodium: 138 mmol/L (ref 135–145)

## 2014-11-03 MED ORDER — SODIUM CHLORIDE 0.9 % IV BOLUS (SEPSIS)
1000.0000 mL | Freq: Once | INTRAVENOUS | Status: AC
  Administered 2014-11-03: 1000 mL via INTRAVENOUS

## 2014-11-03 MED ORDER — SODIUM CHLORIDE 0.9 % IV BOLUS (SEPSIS)
1500.0000 mL | Freq: Once | INTRAVENOUS | Status: AC
  Administered 2014-11-03: 1500 mL via INTRAVENOUS

## 2014-11-03 MED ORDER — SODIUM CHLORIDE 0.9 % IV BOLUS (SEPSIS): 1000.0000 mL | Freq: Once | INTRAVENOUS | Status: DC

## 2014-11-03 NOTE — Progress Notes (Addendum)
NEUROLOGY FOLLOW UP OFFICE NOTE  [name] [id number]  HISTORY OF PRESENT ILLNESS: [name] is a 64 year old right-handed woman with hypertension, stroke, type II diabetes, , seizure disorder, mononeuritis of leg, OSA, thyroid nodule, cervical disc disease, depression, anxiety, chronic pain, chronic back pain with gait instability and falls, polypharmacy, and history of stroke with residual left-sided weakness and meningioma resection in September 2013 who follows up for chronic headaches, seizures, and meningioma.    UPDATE: Labs from May include CBC, which again demonstrated chronic anemia with normal platelets, and CMP with glucose of 171 and mildly elevated alk phos of 122 but normal AST and ALT.  Since June, she has had "spells" where she suddenly doesn't feel well.  She notes numbness in the left leg and will sometimes fall.  She had a fall in late July.  There was no loss of consciousness.  She saw her PCP earlier this week who ordered a carotid doppler, which is pending.  Recent cardiac evaluation was okay.  She says she has been drinking water.  HEADACHES/MIGRAINES: She has longstanding history of multifactorial chronic headaches.  They are described as from the back of her head and top of the front of her head.  It is a pounding pain, 8/10.  There is some nausea, but no photophobia or phonophobia.  She has blurred vision, but that is constant.  She usually takes Tylenol for it, but she also is taking oxycodone for left arm pain.  She has history of medication-overuse and opioids for other pain.  Prior medications included lamotrigine, gabapentin, Lyrica, metoprolol, amitriptyline, and imipramine.  She also tried trigger point injections.  She is not a candidate for triptans due to history of stroke.  Update: stable  SEIZURES: She has history of seizures for many years.  Her last seizure was about 3 years ago.  She described them as staring spells.  She is taking Lamictal 15m  twice daily.  Her EEG from 08/08/12 revealed right temporal epileptiform discharges.    Update:  No seizures.  Lamictal level from 12/08/13 was 6.7.  MENINGIOMA: She had a known history of meningioma.  She presented to the ED in August 2013 with sudden onset numbness of the feet and left hand weakness and numbness.  MRI of brain noted enlargement of previous planum sphenoidale meningioma compared to prior study in 2009, with vasogenic edema.  In September 2013, she underwent a bicoronal craniotomy and gross total resection.  Repeat MRI of brain with and without contrast performed on 05/01/12 revealed postop resection of meningioma of the planum sphenoidale with small amount of residual enhancing tissue along the planum sphenoidale (5 x 83m, postop blood in the bilateral frontal lobes and bilateral encephalomalacia of the gyrus rectus bilaterally.  She had a repeat MRI of the brain with and without contrast performed in May 2016, which showed no recurrence of tumor.  STROKE: She has a history of stroke going back several years ago.  Semiology is unclear but reports left-sided issues.  She was started on Plavix, but she was switched over to ASA 8122m LDL from 08/06/13 98.  Hgb A1c from 10/28/13 was 8.5.   Update:  A1c in July was 7.  LDL in May was 57.  MEMORY: She also reports problems with memory, word-finding difficulties, and problems with name recognition of acquaintances of people at church.  This has been going on for over a year.  MMSE score from 12/08/13 was 27/30 with minor inconsistencies.  Repeat testing from 07/02/14 was stable at 26/30.   PAST MEDICAL HISTORY: Past Medical History  Diagnosis Date  . Bipolar disorder   . CVA (cerebral infarction)   . Pancreatitis 2006    due to Depakote with normal EUS   . Osteoporosis   . Chronic back pain   . Diabetes mellitus   . Trigger finger   . Anxiety disorder   . Hypertension   . Migraines     chronic headaches  . Diverticulosis      TCS 9/08 by Dr. Delfin Edis for diarrhea . Bx for micro scopic colitis negative.   . Schatzki's ring     non critical / EGD with ED 8/2011with RMR  . S/P colonoscopy 5176,1607, 2011    left-sided diverticula, hx of simple adenomas . 2011, random bx negative for microscopic colitis  . Glaucoma   . Allergic rhinitis   . Hypothyroidism     thyroid goiter  . Anemia   . Blood transfusion   . GERD (gastroesophageal reflux disease)   . Stroke     left sided weakness  . Seizures     unknown etiology-on meds-last seizure was 3 yerars ago  . Anxiety   . Depression   . Metabolic encephalopathy 3/71/0626  . Sleep apnea     on CPAP  . Arthritis   . Gum symptoms     infection on antibiotic  . Chronic neck pain   . Mononeuritis lower limb   . Frequent falls     MEDICATIONS: Current Outpatient Prescriptions on File Prior to Visit  Medication Sig Dispense Refill  . amLODipine (NORVASC) 2.5 MG tablet Take 1 tablet (2.5 mg total) by mouth daily. 30 tablet 3  . aspirin EC 81 MG tablet Take 81 mg by mouth daily.    . Blood Glucose Monitoring Suppl (ACCU-CHEK AVIVA PLUS) W/DEVICE KIT Use to test blood sugar daily. 1 kit 0  . Cholecalciferol (D 5000) 5000 UNITS capsule Take 5,000 Units by mouth daily.    . clobetasol cream (TEMOVATE) 9.48 % Apply 1 application topically daily.    . cloNIDine (CATAPRES) 0.3 MG tablet TAKE 1 TABLET BY MOUTH EVERY EIGHT HOURS. ONCE AT 8AM, 4PM, AND 12 MIDNIGHT. 90 tablet 3  . CRESTOR 5 MG tablet TAKE 1 TABLET BY MOUTH AT BEDTIME. 30 tablet 2  . diclofenac sodium (VOLTAREN) 1 % GEL Apply 2 g topically daily as needed (Pain).    [name] dicyclomine (BENTYL) 10 MG capsule Take 1 capsule by mouth 2 (two) times daily.    [name] docusate sodium (COLACE) 100 MG capsule Take 2 capsules (200 mg total) by mouth at bedtime. 30 capsule 0  . fluticasone (FLONASE) 50 MCG/ACT nasal spray Place 2 sprays into both nostrils daily.    [name] glucose blood (ACCU-CHEK AVIVA PLUS) test strip Use to test  blood sugar 4 times daily as instructed. Dx code: E10.65 400 each 3  . hydroxychloroquine (PLAQUENIL) 200 MG tablet Take 200 mg by mouth daily.    . insulin aspart (NOVOLOG FLEXPEN) 100 UNIT/ML FlexPen Inject 20-22 Units into the skin 3 (three) times daily with meals. 30 mL 1  . Insulin Glargine (TOUJEO SOLOSTAR) 300 UNIT/ML SOPN Inject 55 Units into the skin at bedtime. 6 pen 1  . Insulin Pen Needle (CLICKFINE PEN NEEDLES) 32G X 4 MM MISC Use 4x a day 300 each 3  . lamoTRIgine (LAMICTAL) 100 MG tablet TAKE 1 & 1/2 TABLETS ($RemoveBefo'150mg'opKwkFBuowx$ ) BY MOUTH EVERY MORNING. 45 tablet 3  .  levothyroxine (SYNTHROID, LEVOTHROID) 50 MCG tablet TAKE 1 TABLET BY MOUTH EVERY MORNING. 30 tablet 0  . LORazepam (ATIVAN) 0.5 MG tablet Take 1 tablet (0.5 mg total) by mouth 3 (three) times daily. 90 tablet 2  . losartan (COZAAR) 100 MG tablet TAKE 1 TABLET BY MOUTH ONCE DAILY. 30 tablet 3  . Magnesium 250 MG TABS Take 1 tablet by mouth daily.    . meclizine (ANTIVERT) 12.5 MG tablet Take 1 tablet (12.5 mg total) by mouth 3 (three) times daily as needed. For nausea 30 tablet 0  . methocarbamol (ROBAXIN) 500 MG tablet Take 1 tablet by mouth daily.    . metoprolol (LOPRESSOR) 50 MG tablet TAKE 1 TABLET BY MOUTH TWICE DAILY. 60 tablet 6  . mometasone-formoterol (DULERA) 100-5 MCG/ACT AERO Inhale 2 puffs into the lungs 2 (two) times daily. 1 Inhaler 0  . montelukast (SINGULAIR) 10 MG tablet TAKE ONE TABLET BY MOUTH ONCE DAILY. 30 tablet 2  . Multiple Vitamins-Minerals (ONE-A-DAY 50 PLUS PO) Take 1 tablet by mouth daily.    [name] Nystatin (NYAMYC) 100000 UNIT/GM POWD APPLY TO AFFECTED AREA 4 TIMES DAILY. 30 g 2  . pregabalin (LYRICA) 75 MG capsule Take 75 mg by mouth 2 (two) times daily.     . promethazine (PHENERGAN) 12.5 MG tablet Take 1 tablet (12.5 mg total) by mouth every 6 (six) hours as needed for nausea or vomiting. 30 tablet 0  . RABEprazole (ACIPHEX) 20 MG tablet TAKE 1 TABLET BY MOUTH TWICE DAILY. 60 tablet 5  . RESTASIS 0.05 %  ophthalmic emulsion Place 1 drop into both eyes 2 (two) times daily.     . risperiDONE (RISPERDAL) 0.5 MG tablet Take 1 tablet (0.5 mg total) by mouth at bedtime. 30 tablet 2  . sertraline (ZOLOFT) 100 MG tablet Take 2 tablets (200 mg total) by mouth daily. 60 tablet 2  . spironolactone (ALDACTONE) 25 MG tablet TAKE ONE TABLET BY MOUTH DAILY. 30 tablet 2  . traZODone (DESYREL) 100 MG tablet Take 2 tablets (200 mg total) by mouth at bedtime. 60 tablet 2  . vitamin E 400 UNIT capsule Take 400 Units by mouth daily.    [name] ZETIA 10 MG tablet TAKE ONE TABLET BY MOUTH ONCE DAILY. 30 tablet 2   No current facility-administered medications on file prior to visit.    ALLERGIES: Allergies  Allergen Reactions  . Cephalexin Hives  . Iron Nausea And Vomiting  . Milk-Related Compounds Other (See Comments)    Doesn't agree with stomach.   . Penicillins Hives  . Phenazopyridine Hcl Other (See Comments)    [place]      FAMILY HISTORY: Family History  Problem Relation Age of Onset  . Heart attack Mother     HTN  . Pneumonia Father   . Kidney failure Father   . Diabetes Father   . Pancreatic cancer Sister   . Diabetes Brother   . Hypertension Brother   . Diabetes Brother   . Colon cancer Neg Hx   . Anesthesia problems Neg Hx   . Hypotension Neg Hx   . Malignant hyperthermia Neg Hx   . Pseudochol deficiency Neg Hx   . Stroke Maternal Grandmother   . Heart attack Maternal Grandfather   . Cancer Sister     breast   . Alcohol abuse Maternal Uncle   . Hypertension Brother     SOCIAL HISTORY: Social History   Social History  . Marital Status: Divorced    Spouse Name: N/A  .  Number of Children: 1  . Years of Education: 12   Occupational History  . disabled     Social History Main Topics  . Smoking status: Former Smoker -- 0.25 packs/day for 7 years    Types: Cigarettes    Quit date: 05/27/2014  . Smokeless tobacco: Never Used     Comment: quit for 5 weeks now   . Alcohol Use: No       Comment:    . Drug Use: No  . Sexual Activity: No   Other Topics Concern  . Not on file   Social History Narrative    REVIEW OF SYSTEMS: Constitutional: No fevers, chills, or sweats, no generalized fatigue, change in appetite Eyes: No visual changes, double vision, eye pain Ear, nose and throat: No hearing loss, ear pain, nasal congestion, sore throat Cardiovascular: No chest pain, palpitations Respiratory:  No shortness of breath at rest or with exertion, wheezes GastrointestinaI: No nausea, vomiting, diarrhea, abdominal pain, fecal incontinence Genitourinary:  No dysuria, urinary retention or frequency Musculoskeletal:  No neck pain, back pain Integumentary: No rash, pruritus, skin lesions Neurological: as above Psychiatric: No depression, insomnia, anxiety Endocrine: No palpitations, fatigue, diaphoresis, mood swings, change in appetite, change in weight, increased thirst Hematologic/Lymphatic:  No anemia, purpura, petechiae. Allergic/Immunologic: no itchy/runny eyes, nasal congestion, recent allergic reactions, rashes  PHYSICAL EXAM: Filed Vitals:   11/03/14 1000  BP: 90/50  Pulse: 58  Resp: 14  Pulse Ox 91% General: No acute distress.  Patient appears somnolent.  She will close her eyes and mumble responses but will easily arouse when I question why she is so somnolent.  She says she closes her eyes due to an "eye condition".   Head:  Normocephalic/atraumatic Eyes:  Fundi not visualized. Neck: supple, no paraspinal tenderness, full range of motion Heart:  Regular rate and rhythm Lungs:  Clear to auscultation bilaterally Back: No paraspinal tenderness Neurological Exam: alert and oriented to person, place, and time. Attention span and concentration intact, recent and remote memory intact, fund of knowledge intact.  Speech slowed and dysarthric, language intact.  CN II-XII intact. Fundi not visualized. Bulk and tone normal. 5-/5 left hip flexion, otherwise 5/5.   Reduced pinprick sensation to left arm and leg. DTR 1+ throughout, except absent at ankles. Finger to nose slowed but no dysmetria. Wide-based gait.   IMPRESSION: Episodes of weakness and left leg numbness, causing falls.  Unclear etiology.  She has residual left sided leg weakness from the stroke.  It does not really sound like seizure or TIA, although it is on the differential.  Her blood pressure is low today. She has low heart rate, which does not appear new.  Pulse ox was 91%.  Toxic metabolic etiology should be ruled out. Hypersomnolence, chronic Localization-related epilepsy, stable History of meningioma resection Left-sided hemiplegia as late effect of stroke Memory deficits, multifactorial, related to vascular cognitive impairment, hypersomnolence and polypharmacy Polypharmacy  PLAN: 1.  Carotid doppler already ordered and pending. 2.  Will check MRI of brain to look for any acute changes since May (symptoms started in June) 3.  Continue ASA, statin (LDL at goal of less than 70) 4.  Continue Lamictal 111m daily.  Check CMP and lamotrigine level 5.  Follow up with PCP (scheduled next week) 6.  Follow up in 3 months  35 minutes spent face to face with patient, over 50% spent discussing diagnosis and management.  ADDENDUM:  Checked CMP.  BUN is 63 with Cr 2.79 and GFR  21.94.  Probably dehydrated.  Contacted [name] with results.  We will both try and contact patient to go to ED for evaluation and fluids.  Metta Clines, DO  CC:  Tula Nakayama, MD

## 2014-11-03 NOTE — ED Notes (Signed)
Ordered meal tray for pt 

## 2014-11-03 NOTE — ED Provider Notes (Signed)
CSN: [id number]     Arrival date & time 11/03/14  1640 History   First MD Initiated Contact with Patient 11/03/14 1652     Chief Complaint  Patient presents with  . Dehydration     HPI  Patient presents for evaluation of weakness and an abnormal lab. Extensive past medical history. Diabetic. Seizures. Bipolar disorder. Chronic back pain.  Describes about 4 weeks of worsening, pain. Reports chronic pain in her mouth, and her back. Is scheduled for an outpatient surgical procedure on her gum. Was previously on MS Contin 15 mg, however this was discontinued 5 or 6 months ago. She will be started on MS Contin about 7 or 10 days ago. Since her symptoms have worsened since restarting this. Her appetite is poor. She denied eating and drinking well. No vomiting or diarrhea.  She's been dizzy for about the last 7 days. Seen by her neurologist today. Had a little Motrin J level sent off as well as basic labs. She is renal insufficient. Baseline creatinine is less than 1. Creatinine today is 2.8 BUN elevated at 72.  Past Medical History  Diagnosis Date  . Bipolar disorder   . CVA (cerebral infarction)   . Pancreatitis 2006    due to Depakote with normal EUS   . Osteoporosis   . Chronic back pain   . Diabetes mellitus   . Trigger finger   . Anxiety disorder   . Hypertension   . Migraines     chronic headaches  . Diverticulosis     TCS 9/08 by Dr. Delfin Edis for diarrhea . Bx for micro scopic colitis negative.   . Schatzki's ring     non critical / EGD with ED 8/2011with RMR  . S/P colonoscopy [id number], 2011    left-sided diverticula, hx of simple adenomas . 2011, random bx negative for microscopic colitis  . Glaucoma   . Allergic rhinitis   . Hypothyroidism     thyroid goiter  . Anemia   . Blood transfusion   . GERD (gastroesophageal reflux disease)   . Stroke     left sided weakness  . Seizures     unknown etiology-on meds-last seizure was 3 yerars ago  . Anxiety   . Depression    . Metabolic encephalopathy 8/54/6270  . Sleep apnea     on CPAP  . Arthritis   . Gum symptoms     infection on antibiotic  . Chronic neck pain   . Mononeuritis lower limb   . Frequent falls    Past Surgical History  Procedure Laterality Date  . Abdominal hysterectomy  1978  . Cholecystectomy  1984  . Ovarian cyst removal    . Carpal tunnel release Left 07/22/04    Dr. Aline Brochure  . Breast reduction surgery  1994  . Cataract extraction Bilateral   . Biopsy thyroid  2009  . Surgical excision of 3 tumors from right thigh and right buttock  and left upper thigh  2010  . Back surgery  July 2012  . Spine surgery  09/29/2010    Dr. Rolena Infante  . Maloney dilation  12/29/2010    RMR;  . Esophagogastroduodenoscopy  12/29/2010    Rourk-Retained food in the esophagus and stomach, small hiatal hernia, status post Maloney dilation of the esophagus  . Craniotomy  11/23/2011    Procedure: CRANIOTOMY TUMOR EXCISION;  Surgeon: Hosie Spangle, MD;  Location: [facility];  Service: Neurosurgery;  Laterality: N/A;  Craniotomy for tumor resection  .  Brain surgery  11/2011    resection of meningioma  . Colonoscopy N/A 09/25/2012    IOX:BDZHGDJ diverticulosis.  colonic polyp-removed : tubular adenoma  . Esophagogastroduodenoscopy N/A 09/25/2012    MEQ:ASTMHDQQ atonic baggy esophagus status post Maloney dilation 56 F. Hiatal hernia  . Givens capsule study N/A 01/15/2013    NORMAL.   . Bacterial overgrowth test N/A 05/05/2013    Procedure: BACTERIAL OVERGROWTH TEST;  Surgeon: Daneil Dolin, MD;  Location: [facility];  Service: Endoscopy;  Laterality: N/A;  7:30  . Transthoracic echocardiogram  2010    EF 60-65%, mild conc LVH, grade 1 diastolic dysfunction; mildly calcified MV annulus with mildly thickened leaflets, mildly calcified MR annulus  . Nm myocar perf wall motion  2006    "relavtiely normal" persantine, mild anterior thinning (breast attenuation artifact), no region of scar/ischemia  .  Cardiac catheterization  05/10/2005    normal coronaries, normal LV systolic function and EF (Dr. Jackie Plum)   Family History  Problem Relation Age of Onset  . Heart attack Mother     HTN  . Pneumonia Father   . Kidney failure Father   . Diabetes Father   . Pancreatic cancer Sister   . Diabetes Brother   . Hypertension Brother   . Diabetes Brother   . Colon cancer Neg Hx   . Anesthesia problems Neg Hx   . Hypotension Neg Hx   . Malignant hyperthermia Neg Hx   . Pseudochol deficiency Neg Hx   . Stroke Maternal Grandmother   . Heart attack Maternal Grandfather   . Cancer Sister     breast   . Alcohol abuse Maternal Uncle   . Hypertension Brother    Social History  Substance Use Topics  . Smoking status: Former Smoker -- 0.25 packs/day for 7 years    Types: Cigarettes    Quit date: 05/27/2014  . Smokeless tobacco: Never Used     Comment: quit for 5 weeks now   . Alcohol Use: No     Comment:     OB History    Gravida Para Term Preterm AB TAB SAB Ectopic Multiple Living   _0 Review of Systems  Constitutional: Positive for appetite change. Negative for fever, chills, diaphoresis and fatigue.  HENT: Negative for mouth sores, sore throat and trouble swallowing.        Chronic mouth and lower gingival pain  Eyes: Negative for visual disturbance.  Respiratory: Negative for cough, chest tightness, shortness of breath and wheezing.   Cardiovascular: Negative for chest pain.  Gastrointestinal: Negative for nausea, vomiting, abdominal pain, diarrhea and abdominal distention.       Early satiety and poor appetite.  Endocrine: Negative for polydipsia, polyphagia and polyuria.  Genitourinary: Negative for dysuria, frequency and hematuria.  Musculoskeletal: Positive for back pain. Negative for gait problem.  Skin: Negative for color change, pallor and rash.  Neurological: Positive for dizziness and weakness. Negative for syncope, light-headedness and headaches.   Hematological: Does not bruise/bleed easily.  Psychiatric/Behavioral: Negative for behavioral problems and confusion.      Allergies  Cephalexin; Iron; Milk-related compounds; Penicillins; and Phenazopyridine hcl  Home Medications   Prior to Admission medications   Medication Sig Start Date End Date Taking? Authorizing Provider  cloNIDine (CATAPRES) 0.3 MG tablet TAKE 1 TABLET BY MOUTH EVERY EIGHT HOURS. ONCE AT 8AM, [place], AND 12 MIDNIGHT. 09/18/14  Yes [name], MD  CRESTOR 5 MG tablet TAKE 1 TABLET BY MOUTH AT BEDTIME. 08/24/14  Yes Margaret E [name], MD  dicyclomine (BENTYL) 10 MG capsule Take 1 capsule by mouth 3 (three) times daily before meals.  08/21/14  Yes Historical Provider, MD  fluticasone (FLONASE) 50 MCG/ACT nasal spray Place 2 sprays into both nostrils daily.   Yes Historical Provider, MD  lamoTRIgine (LAMICTAL) 100 MG tablet TAKE 1 & 1/2 TABLETS (150mg) BY MOUTH EVERY MORNING. 08/21/14  Yes Adam R Jaffe, DO  levothyroxine (SYNTHROID, LEVOTHROID) 50 MCG tablet TAKE 1 TABLET BY MOUTH EVERY MORNING. 09/21/14  Yes Cristina Gherghe, MD  LORazepam (ATIVAN) 0.5 MG tablet Take 1 tablet (0.5 mg total) by mouth 3 (three) times daily. 09/01/14  Yes Deborah R Ross, MD  losartan (COZAAR) 100 MG tablet TAKE 1 TABLET BY MOUTH ONCE DAILY. 09/18/14  Yes Margaret E [name], MD  metoprolol (LOPRESSOR) 50 MG tablet TAKE 1 TABLET BY MOUTH TWICE DAILY. 09/18/14  Yes Kenneth C Hilty, MD  montelukast (SINGULAIR) 10 MG tablet TAKE ONE TABLET BY MOUTH ONCE DAILY. 08/24/14  Yes Margaret E [name], MD  morphine (MSIR) 15 MG tablet Take 15 mg by mouth 3 (three) times daily as needed for moderate pain or severe pain.   Yes Historical Provider, MD  Nystatin (NYAMYC) 100000 UNIT/GM POWD APPLY TO AFFECTED AREA 4 TIMES DAILY. Patient taking differently: Apply topically 4 (four) times daily. APPLY TO AFFECTED AREA 4 TIMES DAILY. 10/20/14  Yes Margaret E [name], MD  pregabalin (LYRICA) 75 MG capsule Take 75 mg  by mouth 2 (two) times daily.  10/26/11  Yes Stanley E Harrison, MD  RABEprazole (ACIPHEX) 20 MG tablet TAKE 1 TABLET BY MOUTH TWICE DAILY. 07/28/14  Yes Eric A Gill, NP  risperiDONE (RISPERDAL) 0.5 MG tablet Take 1 tablet (0.5 mg total) by mouth at bedtime. 09/01/14  Yes Deborah R Ross, MD  sertraline (ZOLOFT) 100 MG tablet Take 2 tablets (200 mg total) by mouth daily. 09/01/14  Yes Deborah R Ross, MD  spironolactone (ALDACTONE) 25 MG tablet TAKE ONE TABLET BY MOUTH DAILY. 08/24/14  Yes Margaret E [name], MD  traZODone (DESYREL) 150 MG tablet Take 300 mg by mouth at bedtime.   Yes Historical Provider, MD  ZETIA 10 MG tablet TAKE ONE TABLET BY MOUTH ONCE DAILY. 08/24/14  Yes Margaret E [name], MD  amLODipine (NORVASC) 2.5 MG tablet Take 1 tablet (2.5 mg total) by mouth daily. 07/27/14   Margaret E [name], MD  aspirin EC 81 MG tablet Take 81 mg by mouth daily.    Historical Provider, MD  Blood Glucose Monitoring Suppl (ACCU-CHEK AVIVA PLUS) W/DEVICE KIT Use to test blood sugar daily. 09/09/14   Cristina Gherghe, MD  Cholecalciferol (D 5000) 5000 UNITS capsule Take 5,000 Units by mouth daily.    Historical Provider, MD  clobetasol cream (TEMOVATE) 0.05 % Apply 1 application topically daily.    Historical Provider, MD  diclofenac sodium (VOLTAREN) 1 % GEL Apply 2 g topically daily as needed (Pain).    Historical Provider, MD  docusate sodium (COLACE) 100 MG capsule Take 2 capsules (200 mg total) by mouth at bedtime. 06/07/14   Jehanzeb Memon, MD  glucose blood (ACCU-CHEK AVIVA PLUS) test strip Use to test blood sugar 4 times daily as instructed. Dx code: E10.65 01/22/14   Cristina Gherghe, MD  hydroxychloroquine (PLAQUENIL) 200 MG tablet Take 200 mg by mouth daily.    Historical Provider, MD  insulin aspart (NOVOLOG FLEXPEN) 100 UNIT/ML FlexPen Inject 20-22 Units into   the skin 3 (three) times daily with meals. 09/21/14   Cristina Gherghe, MD  Insulin Glargine (TOUJEO SOLOSTAR) 300 UNIT/ML SOPN Inject 55 Units  into the skin at bedtime. 09/21/14   Cristina Gherghe, MD  Insulin Pen Needle (CLICKFINE PEN NEEDLES) 32G X 4 MM MISC Use 4x a day 09/21/14   Cristina Gherghe, MD  Magnesium 250 MG TABS Take 1 tablet by mouth daily.    Historical Provider, MD  Multiple Vitamins-Minerals (ONE-A-DAY 50 PLUS PO) Take 1 tablet by mouth daily.    Historical Provider, MD  promethazine (PHENERGAN) 12.5 MG tablet Take 1 tablet (12.5 mg total) by mouth every 6 (six) hours as needed for nausea or vomiting. 08/20/14   Leslie S Lewis, PA-C  RESTASIS 0.05 % ophthalmic emulsion Place 1 drop into both eyes 2 (two) times daily.  02/04/14   Historical Provider, MD  vitamin E 400 UNIT capsule Take 400 Units by mouth daily.    Historical Provider, MD   BP 147/77 mmHg  Pulse 71  Temp(Src) 98.6 F (37 C) (Oral)  Resp 18  Ht 4' 11" (1.499 m)  Wt 155 lb (70.308 kg)  BMI 31.29 kg/m2  SpO2 97% Physical Exam  Constitutional: She is oriented to person, place, and time. She appears well-developed and well-nourished. No distress.  Awake alert. Offers details of her own history.  HENT:  Head: Normocephalic.  Eyes: Conjunctivae are normal. Pupils are equal, round, and reactive to light. No scleral icterus.  Neck: Normal range of motion. Neck supple. No thyromegaly present.  Cardiovascular: Normal rate and regular rhythm.  Exam reveals no gallop and no friction rub.   No murmur heard. Pulmonary/Chest: Effort normal and breath sounds normal. No respiratory distress. She has no wheezes. She has no rales.  Abdominal: Soft. Bowel sounds are normal. She exhibits no distension. There is no tenderness. There is no rebound.  Soft benign abdomen. No guarding rebound or peritoneal irritation.  Musculoskeletal: Normal range of motion.  Neurological: She is alert and oriented to person, place, and time.  Nonfocal. No nystagmus. No resting tremor. Normal peripheral strength and sensation. No cerebellar findings.  Skin: Skin is warm and dry. No rash  noted.  Psychiatric: She has a normal mood and affect. Her behavior is normal.    ED Course  Procedures (including critical care time) Labs Review Labs Reviewed  BASIC METABOLIC PANEL - Abnormal; Notable for the following:    Glucose, Bld 241 (*)    BUN 72 (*)    Creatinine, Ser 2.85 (*)    GFR calc non Af Amer 16 (*)    GFR calc Af Amer 19 (*)    All other components within normal limits  BASIC METABOLIC PANEL - Abnormal; Notable for the following:    CO2 21 (*)    Glucose, Bld 181 (*)    BUN 60 (*)    Creatinine, Ser 2.27 (*)    Calcium 8.3 (*)    GFR calc non Af Amer 22 (*)    GFR calc Af Amer 25 (*)    All other components within normal limits    Imaging Review No results found. I have personally reviewed and evaluated these images and lab results as part of my medical decision-making.   EKG Interpretation None      MDM   Final diagnoses:  Dehydration  AKI (acute kidney injury)    With initiation of the morphine or hepatitis deteriorated her intake is dropped. She shows acute kidney injury and   dehydration. Plan will be hydration. Recheck kidney function. If improving. We may be able to continue her at home, hold her Lamictal.  Stop her MS Contin. Have her push fluids and recheck within the next 24-48 hours with primary care to recheck kidney status. If not improving may need consideration for admission. We will reevaluate after fluids, and repeat labs.   20:44:  Reevaluated. States she feels "a lot better". She examined his story here. She has urinated. With hydration her creatinine has improved to 2.27. Again discussed with her the possibility of admission to the hospital. She again politely declines. I think this is not an appropriate period of*. Her morphine. I think is contributing to her poor appetite, constipation and fatigue. A vascular hold her Lamictal tonight until she hears from her neurologist about her Lamictal level. Push her fluids and stay hydrated.  I've asked her to have her BUN and creatinine rechecked with Dr. [name] her primary care physician within 48 hours. Told her in no uncertain terms that if is not improving then she may require hospitalization for additional testing and treatment.     , MD 11/03/14 2046 

## 2014-11-03 NOTE — ED Notes (Signed)
Pt states she "doesn't quiet feel herself." Pt went to the neurologist and had blood work done and was told to come here. Pt was been by neurologist in [facility] [place] system, blood work accessible. Pt appears drowsy on triage. Denies pain.

## 2014-11-03 NOTE — Telephone Encounter (Signed)
I spoke with patient niece Oris Drone and advised her that per Dr Tomi Likens and  Dr Moshe Cipro due to abnormal labs it would be best if this patient was taken to the hospital to be checked out . Oris Drone question what type of Dr was calling her and stated this sounds like the normal for her . I advised again that both Dr's  agreed it was really important that she be taken to the hospital  To be checked out

## 2014-11-03 NOTE — ED Notes (Signed)
Pt states the Dr. Rockey Situ her she was dehydrated.

## 2014-11-03 NOTE — Telephone Encounter (Signed)
Mrs gwen called you back please call her at [phone]

## 2014-11-03 NOTE — Telephone Encounter (Signed)
Sent ED referral to [facility]

## 2014-11-03 NOTE — Telephone Encounter (Signed)
I spoke with Mrs [name] son [name] and made him aware that Dr Tomi Likens and Dr Moshe Cipro both agree at this time this patient needs to go to hospital  Due abnormal labs . He stated he was at work now at he would look into it when he got home . Patient is also aware .

## 2014-11-03 NOTE — ED Notes (Signed)
MD at bedside. 

## 2014-11-03 NOTE — Patient Instructions (Signed)
1.  Check MRI of brain without contrast 2.  Check CMP and lamictal level 3.  Follow up with [name] 4.  Follow up on carotid ultrasound 5.  Follow up in 3 months.

## 2014-11-03 NOTE — Telephone Encounter (Signed)
Called today concerned about excessive sioomnolence with intent to rept mRI of brain due to new hemiplegia CMP done shows pre enal azotemia, pt needs to go to ED , will have nurse contact   Nurse pls spk with ED nurse also, reason, somnolence , dehyration pr ARI with dehydration

## 2014-11-03 NOTE — Discharge Instructions (Signed)
Hold your morphine. Hold your Lamictal for 24 hours. Push fluids. Recheck with your physician within 48 hours to recheck your kidney function. If it is not improving, then you may need additional testing, or hospitalization for treatment.  Dehydration, Adult Dehydration means your body does not have as much fluid as it needs. Your kidneys, brain, and heart will not work properly without the right amount of fluids and salt.  HOME CARE  Ask your doctor how to replace body fluid losses (rehydrate).  Drink enough fluids to keep your pee (urine) clear or pale yellow.  Drink small amounts of fluids often if you feel sick to your stomach (nauseous) or throw up (vomit).  Eat like you normally do.  Avoid:  Foods or drinks high in sugar.  Bubbly (carbonated) drinks.  Juice.  Very hot or cold fluids.  Drinks with caffeine.  Fatty, greasy foods.  Alcohol.  Tobacco.  Eating too much.  Gelatin desserts.  Wash your hands to avoid spreading germs (bacteria, viruses).  Only take medicine as told by your doctor.  Keep all doctor visits as told. GET HELP RIGHT AWAY IF:   You cannot drink something without throwing up.  You get worse even with treatment.  Your vomit has blood in it or looks greenish.  Your poop (stool) has blood in it or looks black and tarry.  You have not peed in 6 to 8 hours.  You pee a small amount of very dark pee.  You have a fever.  You pass out (faint).  You have belly (abdominal) pain that gets worse or stays in one spot (localizes).  You have a rash, stiff neck, or bad headache.  You get easily annoyed, sleepy, or are hard to wake up.  You feel weak, dizzy, or very thirsty. MAKE SURE YOU:   Understand these instructions.  Will watch your condition.  Will get help right away if you are not doing well or get worse. Document Released: 12/24/2008 Document Revised: 05/22/2011 Document Reviewed: 10/17/2010 [facility] Patient Information  2015 [place], [place]. This information is not intended to replace advice given to you by your health care provider. Make sure you discuss any questions you have with your health care provider.  Acute Kidney Injury Acute kidney injury is a disease in which there is sudden (acute) damage to the kidneys. The kidneys are 2 organs that lie on either side of the spine between the middle of the back and the front of the abdomen. The kidneys:  Remove wastes and extra water from the blood.   Produce important hormones. These help keep bones strong, regulate blood pressure, and help create red blood cells.   Balance the fluids and chemicals in the blood and tissues. A small amount of kidney damage may not cause problems, but a large amount of damage may make it difficult or impossible for the kidneys to work the way they should. Acute kidney injury may develop into long-lasting (chronic) kidney disease. It may also develop into a life-threatening disease called end-stage kidney disease. Acute kidney injury can get worse very quickly, so it should be treated right away. Early treatment may prevent other kidney diseases from developing.  CAUSES   A problem with blood flow to the kidneys. This may be caused by:   Blood loss.   Heart disease.   Severe burns.   Liver disease.  Direct damage to the kidneys. This may be caused by:  Some medicines.   A kidney infection.   Poisoning  or consuming toxic substances.   A surgical wound.   A blow to the kidney area.   A problem with urine flow. This may be caused by:   Cancer.   Kidney stones.   An enlarged prostate. SYMPTOMS   Swelling (edema) of the legs, ankles, or feet.   Tiredness (lethargy).   Nausea or vomiting.   Confusion.   Problems with urination, such as:   Painful or burning feeling during urination.   Decreased urine production.   Frequent accidents in children who are potty trained.   Bloody  urine.   Muscle twitches and cramps.   Shortness of breath.   Seizures.   Chest pain or pressure. Sometimes, no symptoms are present. DIAGNOSIS Acute kidney injury may be detected and diagnosed by tests, including blood, urine, imaging, or kidney biopsy tests.  TREATMENT Treatment of acute kidney injury varies depending on the cause and severity of the kidney damage. In mild cases, no treatment may be needed. The kidneys may heal on their own. If acute kidney injury is more severe, your caregiver will treat the cause of the kidney damage, help the kidneys heal, and prevent complications from occurring. Severe cases may require a procedure to remove toxic wastes from the body (dialysis) or surgery to repair kidney damage. Surgery may involve:   Repair of a torn kidney.   Removal of an obstruction. Most of the time, you will need to stay overnight at the hospital.  HOME CARE INSTRUCTIONS:  Follow your prescribed diet.  Only take over-the-counter or prescription medicines as directed by your caregiver.  Do not take any new medicines (prescription, over-the-counter, or nutritional supplements) unless approved by your caregiver. Many medicines can worsen your kidney damage or need to have the dose adjusted.   Keep all follow-up appointments as directed by your caregiver.  Observe your condition to make sure you are healing as expected. SEEK IMMEDIATE MEDICAL CARE IF:  You are feeling ill or have severe pain in the back or side.   Your symptoms return or you have new symptoms.  You have any symptoms of end-stage kidney disease. These include:   Persistent itchiness.   Loss of appetite.   Headaches.   Abnormally dark or light skin.  Numbness in the hands or feet.   Easy bruising.   Frequent hiccups.   Menstruation stops.   You have a fever.  You have increased urine production.  You have pain or bleeding when urinating. MAKE SURE YOU:    Understand these instructions.  Will watch your condition.  Will get help right away if you are not doing well or get worse Document Released: 09/12/2010 Document Revised: 06/24/2012 Document Reviewed: 10/27/2011 [facility] Patient Information 2015 [place], [place]. This information is not intended to replace advice given to you by your health care provider. Make sure you discuss any questions you have with your health care provider.

## 2014-11-03 NOTE — ED Notes (Signed)
Meal given

## 2014-11-04 ENCOUNTER — Ambulatory Visit: Payer: Self-pay | Admitting: Family Medicine

## 2014-11-04 ENCOUNTER — Ambulatory Visit (HOSPITAL_COMMUNITY): Payer: Self-pay | Admitting: Psychiatry

## 2014-11-04 ENCOUNTER — Telehealth: Payer: Self-pay

## 2014-11-04 DIAGNOSIS — R4189 Other symptoms and signs involving cognitive functions and awareness: Secondary | ICD-10-CM

## 2014-11-04 DIAGNOSIS — I10 Essential (primary) hypertension: Secondary | ICD-10-CM

## 2014-11-04 NOTE — Telephone Encounter (Signed)
Mrs Meredith Mody was advised to take patient to hospital

## 2014-11-04 NOTE — Telephone Encounter (Signed)
Needs cbc and diff , cmp and EGFR and CCUA doe on 008/29 has OV here on 8/30 whic needs to be changed to eval for oral surgery , that is why she needfs to come then Since ED d/c her yesterday would not rept before Monday. Encourage her to ensure maintains adequate fluid intake pls order her labs , not stat

## 2014-11-05 ENCOUNTER — Encounter: Payer: Self-pay | Admitting: Gastroenterology

## 2014-11-05 ENCOUNTER — Ambulatory Visit (INDEPENDENT_AMBULATORY_CARE_PROVIDER_SITE_OTHER): Payer: Commercial Managed Care - HMO | Admitting: Gastroenterology

## 2014-11-05 VITALS — BP 138/68 | HR 85 | Temp 97.3°F | Ht 63.0 in | Wt 159.2 lb

## 2014-11-05 DIAGNOSIS — D649 Anemia, unspecified: Secondary | ICD-10-CM | POA: Diagnosis not present

## 2014-11-05 DIAGNOSIS — R1310 Dysphagia, unspecified: Secondary | ICD-10-CM

## 2014-11-05 DIAGNOSIS — K3184 Gastroparesis: Secondary | ICD-10-CM | POA: Diagnosis not present

## 2014-11-05 DIAGNOSIS — K219 Gastro-esophageal reflux disease without esophagitis: Secondary | ICD-10-CM | POA: Diagnosis not present

## 2014-11-05 LAB — LAMOTRIGINE LEVEL

## 2014-11-05 MED ORDER — LUBIPROSTONE 8 MCG PO CAPS: 8.0000 ug | ORAL_CAPSULE | Freq: Every day | ORAL | Status: DC

## 2014-11-05 NOTE — Telephone Encounter (Signed)
Patient aware and will have labs drawn.

## 2014-11-05 NOTE — Addendum Note (Signed)
Addended by: Denman George B on: 11/05/2014 08:34 AM   Modules accepted: Orders

## 2014-11-05 NOTE — Patient Instructions (Signed)
1. Suspect you have pelvic floor dysfunction. We can treat you with low dose amitiza to see if stool is easier to pass if they are looser AND/OR we can send you to [facility] for further testing and treatment if appropriate.  2. I will also discuss your symptoms with Dr. Gala Romney. If there are any further recommendations, we will let you know.

## 2014-11-05 NOTE — Progress Notes (Signed)
Primary Care Physician: Tula Nakayama, MD  Primary Gastroenterologist:  Garfield Cornea, MD   Chief Complaint  Patient presents with  . Follow-up    HPI: [name] is a 64 y.o. female here for follow up. She has h/o epigastric pain, dysphagia, fecal incontinence, gastroparesis, microcytic anemia likely anemia of chronic disease. Last seen in 08/2014. .EGD, colonoscopy and capsule study within the past 2 years completed without significant pathology being found. BPE 07/2014 showed dysmotility but barium tablet transiently delays at [facility] but passed with additional swallow of water. CT 05/2014 with no evidence of mesenteric ischemia or significant findings. See Dr. Roseanne Kaufman OV note from 07/2014 with specific notations to review of CT.  Biggest concern of ongoing issues with BMs. Still straining to have BM. Stools are soft. Takes stool softeners. Passes stool every 3-4 days, soft/formed. Has trouble getting stool out. Back on Bentyl BID now for abd pain. Straining makes stomach sore. Changed probiotics. Increased water intake. More fiber. Pain left abdomen as if going to have diarrhea and have a lot of gas.  Takes gas-x. Symptoms present for 40+ years, after "I tore having my son". Used to be more diarrhea and incontinence years ago but now has to strain.   Current Outpatient Prescriptions  Medication Sig Dispense Refill  . amLODipine (NORVASC) 2.5 MG tablet Take 1 tablet (2.5 mg total) by mouth daily. 30 tablet 3  . aspirin EC 81 MG tablet Take 81 mg by mouth daily.    . Blood Glucose Monitoring Suppl (ACCU-CHEK AVIVA PLUS) W/DEVICE KIT Use to test blood sugar daily. 1 kit 0  . Cholecalciferol (D 5000) 5000 UNITS capsule Take 5,000 Units by mouth daily.    . clobetasol cream (TEMOVATE) 6.71 % Apply 1 application topically daily.    . cloNIDine (CATAPRES) 0.3 MG tablet TAKE 1 TABLET BY MOUTH EVERY EIGHT HOURS. ONCE AT 8AM, 4PM, AND 12 MIDNIGHT. 90 tablet 3  . CRESTOR 5 MG tablet TAKE 1  TABLET BY MOUTH AT BEDTIME. 30 tablet 2  . diclofenac sodium (VOLTAREN) 1 % GEL Apply 2 g topically daily as needed (Pain).    [name] dicyclomine (BENTYL) 10 MG capsule Take 1 capsule by mouth 3 (three) times daily before meals.     . docusate sodium (COLACE) 100 MG capsule Take 2 capsules (200 mg total) by mouth at bedtime. 30 capsule 0  . fluticasone (FLONASE) 50 MCG/ACT nasal spray Place 2 sprays into both nostrils daily.    [name] glucose blood (ACCU-CHEK AVIVA PLUS) test strip Use to test blood sugar 4 times daily as instructed. Dx code: E10.65 400 each 3  . hydroxychloroquine (PLAQUENIL) 200 MG tablet Take 200 mg by mouth daily.    . insulin aspart (NOVOLOG FLEXPEN) 100 UNIT/ML FlexPen Inject 20-22 Units into the skin 3 (three) times daily with meals. 30 mL 1  . Insulin Glargine (TOUJEO SOLOSTAR) 300 UNIT/ML SOPN Inject 55 Units into the skin at bedtime. 6 pen 1  . Insulin Pen Needle (CLICKFINE PEN NEEDLES) 32G X 4 MM MISC Use 4x a day 300 each 3  . levothyroxine (SYNTHROID, LEVOTHROID) 50 MCG tablet TAKE 1 TABLET BY MOUTH EVERY MORNING. 30 tablet 0  . LORazepam (ATIVAN) 0.5 MG tablet Take 1 tablet (0.5 mg total) by mouth 3 (three) times daily. 90 tablet 2  . losartan (COZAAR) 100 MG tablet TAKE 1 TABLET BY MOUTH ONCE DAILY. 30 tablet 3  . Magnesium 250 MG TABS Take 1 tablet by mouth  daily.    . metoprolol (LOPRESSOR) 50 MG tablet TAKE 1 TABLET BY MOUTH TWICE DAILY. 60 tablet 6  . montelukast (SINGULAIR) 10 MG tablet TAKE ONE TABLET BY MOUTH ONCE DAILY. 30 tablet 2  . Multiple Vitamins-Minerals (ONE-A-DAY 50 PLUS PO) Take 1 tablet by mouth daily.    [name] Nystatin (NYAMYC) 100000 UNIT/GM POWD APPLY TO AFFECTED AREA 4 TIMES DAILY. (Patient taking differently: Apply topically 4 (four) times daily. APPLY TO AFFECTED AREA 4 TIMES DAILY.) 30 g 2  . pregabalin (LYRICA) 75 MG capsule Take 75 mg by mouth 2 (two) times daily.     . promethazine (PHENERGAN) 12.5 MG tablet Take 1 tablet (12.5 mg total) by mouth  every 6 (six) hours as needed for nausea or vomiting. 30 tablet 0  . RABEprazole (ACIPHEX) 20 MG tablet TAKE 1 TABLET BY MOUTH TWICE DAILY. 60 tablet 5  . RESTASIS 0.05 % ophthalmic emulsion Place 1 drop into both eyes 2 (two) times daily.     . risperiDONE (RISPERDAL) 0.5 MG tablet Take 1 tablet (0.5 mg total) by mouth at bedtime. 30 tablet 2  . sertraline (ZOLOFT) 100 MG tablet Take 2 tablets (200 mg total) by mouth daily. 60 tablet 2  . spironolactone (ALDACTONE) 25 MG tablet TAKE ONE TABLET BY MOUTH DAILY. 30 tablet 2  . traZODone (DESYREL) 150 MG tablet Take 300 mg by mouth at bedtime.    . vitamin E 400 UNIT capsule Take 400 Units by mouth daily.    [name] ZETIA 10 MG tablet TAKE ONE TABLET BY MOUTH ONCE DAILY. 30 tablet 2   No current facility-administered medications for this visit.    Allergies as of 11/05/2014 - Review Complete 11/05/2014  Allergen Reaction Noted  . Cephalexin Hives   . Iron Nausea And Vomiting 11/15/2012  . Milk-related compounds Other (See Comments) 07/29/2008  . Penicillins Hives   . Phenazopyridine hcl Other (See Comments) 06/19/2007    ROS:  General: Negative for anorexia, weight loss, fever, chills, fatigue, weakness. ENT: Negative for hoarseness, difficulty swallowing , nasal congestion. CV: Negative for chest pain, angina, palpitations, dyspnea on exertion, peripheral edema.  Respiratory: Negative for dyspnea at rest, dyspnea on exertion, cough, sputum, wheezing.  GI: See history of present illness. GU:  Negative for dysuria, hematuria, urinary incontinence, urinary frequency, nocturnal urination.  Endo: Negative for unusual weight change.    Physical Examination:   BP 138/68 mmHg  Pulse 85  Temp(Src) 97.3 F (36.3 C) (Oral)  Ht _0  (1.6 m)  Wt 159 lb 3.2 oz (72.213 kg)  BMI 28.21 kg/m2  General: Well-nourished, well-developed in no acute distress.  Eyes: No icterus. Mouth: Oropharyngeal mucosa moist and pink , no lesions erythema or  exudate. Lungs: Clear to auscultation bilaterally.  Heart: Regular rate and rhythm, no murmurs rubs or gallops.  Abdomen: Bowel sounds are normal, nontender, nondistended, no hepatosplenomegaly or masses, no abdominal bruits or hernia , no rebound or guarding.   Extremities: No lower extremity edema. No clubbing or deformities. Neuro: Alert and oriented x 4   Skin: Warm and dry, no jaundice.   Psych: Alert and cooperative, normal mood and affect.  Labs:  Lab Results  Component Value Date   WBC 6.0 07/15/2014   HGB 10.5* 07/15/2014   HCT 35.3* 07/15/2014   MCV 73.8* 07/15/2014   PLT 215 07/15/2014   Lab Results  Component Value Date   CREATININE 2.27* 11/03/2014   BUN 60* 11/03/2014   NA 138 11/03/2014  K 4.2 11/03/2014   CL 110 11/03/2014   CO2 21* 11/03/2014   Lab Results  Component Value Date   ALT 32 11/03/2014   AST 35 11/03/2014   ALKPHOS 112 11/03/2014   BILITOT 0.4 11/03/2014    Imaging Studies: No results found.   Impression/Plan:  64 y/o female with multiple GI issues. GERD/gastroparesis/dysphagia stable. Chronic anemia stable. Difficulty with passage of BM, soft stool. ?pelvic floor dysfunction. Trial of Amitiza (low-dose). Discussed possibility of referral to [facility] for further testing and treatment but she declines at this time. Progress report in few weeks.

## 2014-11-06 ENCOUNTER — Ambulatory Visit (HOSPITAL_COMMUNITY)
Admission: RE | Admit: 2014-11-06 | Discharge: 2014-11-06 | Disposition: A | Discharge status: Home / Self Care | Payer: Commercial Managed Care - HMO | Source: Ambulatory Visit | Attending: Family Medicine | Admitting: Family Medicine

## 2014-11-06 DIAGNOSIS — R0989 Other specified symptoms and signs involving the circulatory and respiratory systems: Secondary | ICD-10-CM | POA: Diagnosis present

## 2014-11-06 DIAGNOSIS — R42 Dizziness and giddiness: Secondary | ICD-10-CM

## 2014-11-06 DIAGNOSIS — I6523 Occlusion and stenosis of bilateral carotid arteries: Secondary | ICD-10-CM | POA: Insufficient documentation

## 2014-11-09 ENCOUNTER — Ambulatory Visit (INDEPENDENT_AMBULATORY_CARE_PROVIDER_SITE_OTHER): Payer: Medicare HMO | Admitting: Psychiatry

## 2014-11-09 ENCOUNTER — Other Ambulatory Visit: Payer: Self-pay | Admitting: Family Medicine

## 2014-11-09 ENCOUNTER — Encounter (HOSPITAL_COMMUNITY): Payer: Self-pay | Admitting: Psychiatry

## 2014-11-09 VITALS — BP 168/75 | HR 71 | Ht 63.0 in | Wt 156.0 lb

## 2014-11-09 DIAGNOSIS — R4189 Other symptoms and signs involving cognitive functions and awareness: Secondary | ICD-10-CM | POA: Diagnosis not present

## 2014-11-09 DIAGNOSIS — I10 Essential (primary) hypertension: Secondary | ICD-10-CM | POA: Diagnosis not present

## 2014-11-09 DIAGNOSIS — F331 Major depressive disorder, recurrent, moderate: Secondary | ICD-10-CM

## 2014-11-09 DIAGNOSIS — F332 Major depressive disorder, recurrent severe without psychotic features: Secondary | ICD-10-CM

## 2014-11-09 MED ORDER — LORAZEPAM 0.5 MG PO TABS: 0.5000 mg | ORAL_TABLET | Freq: Three times a day (TID) | ORAL | Status: DC

## 2014-11-09 MED ORDER — RISPERIDONE 0.5 MG PO TABS: 0.5000 mg | ORAL_TABLET | Freq: Every day | ORAL | Status: DC

## 2014-11-09 MED ORDER — TRAZODONE HCL 150 MG PO TABS: 300.0000 mg | ORAL_TABLET | Freq: Every day | ORAL | Status: DC

## 2014-11-09 MED ORDER — SERTRALINE HCL 100 MG PO TABS: 200.0000 mg | ORAL_TABLET | Freq: Every day | ORAL | Status: DC
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
## 2014-11-10 ENCOUNTER — Telehealth (HOSPITAL_COMMUNITY): Payer: Self-pay | Admitting: *Deleted

## 2014-11-10 ENCOUNTER — Ambulatory Visit: Payer: Self-pay | Admitting: Family Medicine

## 2014-11-10 ENCOUNTER — Encounter: Payer: Self-pay | Admitting: Family Medicine

## 2014-11-10 ENCOUNTER — Ambulatory Visit (INDEPENDENT_AMBULATORY_CARE_PROVIDER_SITE_OTHER): Payer: Commercial Managed Care - HMO | Admitting: Family Medicine

## 2014-11-10 VITALS — BP 142/68 | HR 62 | Resp 16 | Ht 63.0 in | Wt 156.1 lb

## 2014-11-10 DIAGNOSIS — Z01818 Encounter for other preprocedural examination: Secondary | ICD-10-CM

## 2014-11-10 DIAGNOSIS — Z23 Encounter for immunization: Secondary | ICD-10-CM

## 2014-11-10 LAB — URINALYSIS, MICROSCOPIC ONLY
BACTERIA UA: NONE SEEN [HPF]
Casts: NONE SEEN [LPF]
Crystals: NONE SEEN [HPF]
Yeast: NONE SEEN [HPF]

## 2014-11-10 LAB — COMPLETE METABOLIC PANEL WITH GFR
ALBUMIN: 4 g/dL (ref 3.6–5.1)
ALK PHOS: 84 U/L (ref 33–130)
ALT: 20 U/L (ref 6–29)
AST: 18 U/L (ref 10–35)
BUN: 14 mg/dL (ref 7–25)
CO2: 27 mmol/L (ref 20–31)
Calcium: 9.1 mg/dL (ref 8.6–10.4)
Chloride: 106 mmol/L (ref 98–110)
Creat: 1.05 mg/dL — ABNORMAL HIGH (ref 0.50–0.99)
GFR, EST NON AFRICAN AMERICAN: 56 mL/min — AB (ref >=60)
GFR, Est African American: 65 mL/min (ref >=60)
GLUCOSE: 104 mg/dL — AB (ref 65–99)
POTASSIUM: 3.8 mmol/L (ref 3.5–5.3)
SODIUM: 141 mmol/L (ref 135–146)
Total Bilirubin: 0.4 mg/dL (ref 0.2–1.2)
Total Protein: 6.7 g/dL (ref 6.1–8.1)

## 2014-11-10 LAB — CBC WITH DIFFERENTIAL/PLATELET
BASOS PCT: 0 % (ref 0–1)
Basophils Absolute: 0 10*3/uL (ref 0.0–0.1)
EOS ABS: 0.2 10*3/uL (ref 0.0–0.7)
EOS PCT: 2 % (ref 0–5)
HCT: 34.1 % — ABNORMAL LOW (ref 36.0–46.0)
HEMOGLOBIN: 10.4 g/dL — AB (ref 12.0–15.0)
Lymphocytes Relative: 26 % (ref 12–46)
Lymphs Abs: 2.5 10*3/uL (ref 0.7–4.0)
MCH: 22.5 pg — AB (ref 26.0–34.0)
MCHC: 30.5 g/dL (ref 30.0–36.0)
MCV: 73.7 fL — ABNORMAL LOW (ref 78.0–100.0)
MONO ABS: 0.7 10*3/uL (ref 0.1–1.0)
MONOS PCT: 7 % (ref 3–12)
MPV: 8.8 fL (ref 8.6–12.4)
Neutro Abs: 6.4 10*3/uL (ref 1.7–7.7)
Neutrophils Relative %: 65 % (ref 43–77)
Platelets: 224 10*3/uL (ref 150–400)
RBC: 4.63 MIL/uL (ref 3.87–5.11)
RDW: 15.9 % — ABNORMAL HIGH (ref 11.5–15.5)
WBC: 9.8 10*3/uL (ref 4.0–10.5)

## 2014-11-10 LAB — URINALYSIS, ROUTINE W REFLEX MICROSCOPIC
Bilirubin Urine: NEGATIVE
GLUCOSE, UA: NEGATIVE
Hgb urine dipstick: NEGATIVE
Ketones, ur: NEGATIVE
Nitrite: NEGATIVE
PROTEIN: NEGATIVE
Specific Gravity, Urine: 1.017 (ref 1.001–1.035)
pH: 5.5 (ref 5.0–8.0)

## 2014-11-10 NOTE — Progress Notes (Signed)
   Subjective:    Patient ID: [name], female    DOB: 21-Jul-1950, 64 y.o.   MRN: [id number]  HPI Pt is in for medical clearance for oral surgery She denies any recent or current fever, chills, chest congestion or dysuria or frequency. She denies chest pain , palpitations or light headeness. Recently was dehydrated , however, with ensuring adequate fluids, her labs have corrected and are normal Blood sugar control is good and she is followed by endocrinologist for this. Her most recent cardiology evaluation in June reports normal exam and normal EKG. Medcations are visually reviewed in the office and she takes her medication as listed   Review of Systems See HPI     Objective:   Physical Exam  BP 142/68 mmHg  Pulse 62  Resp 16  Ht 5\' 3"  (1.6 m)  Wt 156 lb 1.9 oz (70.816 kg)  BMI 27.66 kg/m2  SpO2 98%   Patient alert and oriented and in no cardiopulmonary distress.  HEENT: No facial asymmetry, EOMI,   oropharynx pink and moist.  Neck supple no JVD, no mass.  Chest: Clear to auscultation bilaterally.  CVS: S1, S2 no murmurs, no S3.Regular rate.  ABD: Soft non tender.   Ext: No edema  MS: Adequate ROM spine, shoulders, hips and knees.  Skin: Intact, no ulcerations or rash noted.  Psych: Good eye contact, normal affect. Memory intact not anxious or depressed appearing.  CNS: CN 2-12 intact, power,  normal throughout.no focal deficits noted.       Assessment & Plan:  Preoperative evaluation to rule out surgical contraindication Medical and surgical history are reviewed and are as documented Patient has no current symptoms of infection or cardio pulmonary compromise that would prevent her from undergoing general anaesthesia. She had a normal cardiology evaluation on August 25, 2014 , EKG at that visit was normal Mental health is stable She is medically cleared for  oral surgery, under general anaesthesia. Recent blood work is excellent  Need for prophylactic  vaccination and inoculation against influenza After obtaining informed consent, the vaccine is  administered by LPN.

## 2014-11-10 NOTE — Assessment & Plan Note (Signed)
After obtaining informed consent, the vaccine is  administered by LPN.  

## 2014-11-10 NOTE — Patient Instructions (Signed)
Annual exam in October as before  Labs and exam are good and you are medically cleared for dental surgery  Flu vaccine today  I will send a message to your cardiologist after you next visit

## 2014-11-10 NOTE — Telephone Encounter (Signed)
Per Dr. Harrington Challenger to call in pt Ativan to her pharmacy. Called pharmacy and spoke with Ovid Curd and per Ovid Curd, they will go ahead and get pt medication ready for her.

## 2014-11-10 NOTE — Assessment & Plan Note (Signed)
Medical and surgical history are reviewed and are as documented Patient has no current symptoms of infection or cardio pulmonary compromise that would prevent her from undergoing general anaesthesia. She had a normal cardiology evaluation on August 25, 2014 , EKG at that visit was normal Mental health is stable She is medically cleared for  oral surgery, under general anaesthesia. Recent blood work is excellent

## 2014-11-11 ENCOUNTER — Ambulatory Visit (HOSPITAL_COMMUNITY): Admission: RE | Admit: 2014-11-11 | Payer: Commercial Managed Care - HMO | Source: Ambulatory Visit

## 2014-11-11 LAB — TSH: TSH: 1.352 u[IU]/mL (ref 0.350–4.500)

## 2014-11-12 ENCOUNTER — Ambulatory Visit: Payer: Self-pay | Admitting: Family Medicine

## 2014-11-13 ENCOUNTER — Telehealth: Payer: Self-pay

## 2014-11-13 NOTE — Telephone Encounter (Signed)
Aid is aware

## 2014-11-13 NOTE — Telephone Encounter (Signed)
pls let her know last BP was good. She is already on a "fluid pill" spironolactone, so I will not add the HCTZ,   Keep her salt intake down and keep elevating her legs

## 2014-11-13 NOTE — Telephone Encounter (Signed)
States her legs have been swelling again and around her ankles, have been drinking water and keeping them elevated but they have been swelling since her HCTZ was d/c'd. Wants to know if she can have something for it. [phone]

## 2014-11-13 NOTE — Telephone Encounter (Signed)
Patient aware.

## 2014-11-17 ENCOUNTER — Telehealth: Payer: Self-pay | Admitting: *Deleted

## 2014-11-17 NOTE — Progress Notes (Signed)
cc'ed to pcp °

## 2014-11-17 NOTE — Telephone Encounter (Signed)
Pt called LMOM stating her feet and ankles are swelling.

## 2014-11-17 NOTE — Telephone Encounter (Signed)
Message was on the machine from Friday. Already talked to her aid and gave advise from Dr. See previous message

## 2014-11-19 ENCOUNTER — Telehealth: Payer: Self-pay | Admitting: *Deleted

## 2014-11-19 DIAGNOSIS — M5440 Lumbago with sciatica, unspecified side: Secondary | ICD-10-CM | POA: Diagnosis not present

## 2014-11-19 NOTE — Telephone Encounter (Signed)
Pt called requesting to talk with the nurse about some things that are going on, pt said she received a letter regarding her personal information and ID. I made pt aware that it may could be where we talked to [facility] regarding her PCP reguarding a bill because somehow they got Dr. Harrington Challenger on her card for PCP and I called and spoke with Merrilee Seashore at [facility] and he stated [name] is on card, I made him aware Viney is Dr. Griffin Dakin patient and we referred her to Dr. Harrington Challenger in Feb of 2016 and Dr. Harrington Challenger works for [facility] and she is not a PCP, pt said the letter is stating a fraud alert and they have a Radio producer reviewing it. Anaisa requested that [name] to still call her back.

## 2014-11-20 MED ORDER — DICLOFENAC SODIUM 1 % TD GEL: 2.0000 g | Freq: Every day | TRANSDERMAL | Status: DC | PRN

## 2014-11-20 NOTE — Telephone Encounter (Signed)
States she has been having pain in her right leg for the past week or so and feels like it is going to just give out on her sometimes. A throbbing pain- I advised it could possibly be coming from her back but she said she had a recent hip MRI but the pain wasn't in her hip, its in her upper leg and down into her lower leg. Wants to know if you think she needs an xray next week.

## 2014-11-21 NOTE — Telephone Encounter (Signed)
pls explain that pain is described is from her low back, no I do not believe she needs an xray, I recommend she see dr Rolena Infante who has operated on her back in [place], pls refer if she agrees, he will examine an order any necessary tests he sees fit

## 2014-11-24 NOTE — Telephone Encounter (Signed)
Pt aware.

## 2014-12-01 ENCOUNTER — Telehealth: Payer: Self-pay | Admitting: *Deleted

## 2014-12-01 NOTE — Telephone Encounter (Signed)
Ms [name] called stating she needs a meter called in for her to use, patient said [place] [employer] was going to charge her 15$ for the meter and [employer] may give it to her for free. Patient said she has needles and strips she just needs the meter, patient said she needs something to make the needle to come out patient said she has problems sticking her finger with the regular needle . Please advise

## 2014-12-02 ENCOUNTER — Telehealth: Payer: Self-pay | Admitting: Family Medicine

## 2014-12-02 ENCOUNTER — Telehealth: Payer: Self-pay | Admitting: Internal Medicine

## 2014-12-02 MED ORDER — ACCU-CHEK MULTICLIX LANCET DEV KIT: PACK | Status: DC

## 2014-12-02 NOTE — Telephone Encounter (Signed)
States she thinks its been called in and she will call back if she needs meter

## 2014-12-02 NOTE — Telephone Encounter (Signed)
[name] please call [name] reg: a condition she is having, please advise?

## 2014-12-02 NOTE — Telephone Encounter (Signed)
Patient called and would like to know if the lancet trigger thing can be sent to her pharmacy as hers is broken    Please advise   Thank you

## 2014-12-02 NOTE — Telephone Encounter (Signed)
Called pt and asked which lancet device she uses. Pt uses a accu-chek multiclix device. rx for a new one sent to pt's pharmacy.

## 2014-12-03 ENCOUNTER — Other Ambulatory Visit: Payer: Self-pay | Admitting: *Deleted

## 2014-12-03 MED ORDER — ACCU-CHEK FASTCLIX LANCETS MISC: Status: DC

## 2014-12-03 MED ORDER — GLUCOSE BLOOD VI STRP: ORAL_STRIP | Status: DC

## 2014-12-03 MED ORDER — ACCU-CHEK NANO SMARTVIEW W/DEVICE KIT: 1.0000 | PACK | Freq: Every day | Status: DC

## 2014-12-03 MED ORDER — ACCU-CHEK SMARTVIEW CONTROL VI LIQD: 1.0000 | Status: DC | PRN

## 2014-12-03 MED ORDER — BD SWAB SINGLE USE REGULAR PADS: MEDICATED_PAD | Status: DC

## 2014-12-03 NOTE — Telephone Encounter (Signed)
Glucose Meter change.

## 2014-12-09 NOTE — Telephone Encounter (Signed)
Spoke with patient and she was upset with her family situation. I offered to refer her for therapy but she declined now and would call me back if she needed anything

## 2014-12-10 NOTE — H&P (Signed)
HISTORY AND PHYSICAL  [name] is a 64 y.o. female patient with CC: painful areas right and left mandible. Unable to wear denture   No diagnosis found.  Past Medical History  Diagnosis Date  . Bipolar disorder   . CVA (cerebral infarction)   . Pancreatitis 2006    due to Depakote with normal EUS   . Osteoporosis   . Chronic back pain   . Diabetes mellitus   . Trigger finger   . Anxiety disorder   . Hypertension   . Migraines     chronic headaches  . Diverticulosis     TCS 9/08 by Dr. Delfin Edis for diarrhea . Bx for micro scopic colitis negative.   . Schatzki's ring     non critical / EGD with ED 8/2011with RMR  . S/P colonoscopy 7517,0017, 2011    left-sided diverticula, hx of simple adenomas . 2011, random bx negative for microscopic colitis  . Glaucoma   . Allergic rhinitis   . Hypothyroidism     thyroid goiter  . Anemia   . Blood transfusion   . GERD (gastroesophageal reflux disease)   . Stroke     left sided weakness  . Seizures     unknown etiology-on meds-last seizure was 3 yerars ago  . Anxiety   . Depression   . Metabolic encephalopathy 4/94/4967  . Sleep apnea     on CPAP  . Arthritis   . Gum symptoms     infection on antibiotic  . Chronic neck pain   . Mononeuritis lower limb   . Frequent falls     No current facility-administered medications for this encounter.   Current Outpatient Prescriptions  Medication Sig Dispense Refill  . ACCU-CHEK FASTCLIX LANCETS MISC Use to test blood sugar 4 times daily. Dx: E10.65 408 each 1  . Alcohol Swabs (B-D SINGLE USE SWABS REGULAR) PADS Use for injections and glucose testing 8 times daily. Dx: E10.65 900 each 1  . amLODipine (NORVASC) 2.5 MG tablet Take 1 tablet (2.5 mg total) by mouth daily. 30 tablet 3  . aspirin EC 81 MG tablet Take 81 mg by mouth daily.    . Blood Glucose Calibration (ACCU-CHEK SMARTVIEW CONTROL) LIQD 1 each by Other route as needed. 1 each 2  . Blood Glucose Monitoring Suppl  (ACCU-CHEK NANO SMARTVIEW) W/DEVICE KIT 1 each by Does not apply route daily. Dx: E10.65 1 kit 0  . Cholecalciferol (D 5000) 5000 UNITS capsule Take 5,000 Units by mouth daily.    . clobetasol cream (TEMOVATE) 5.91 % Apply 1 application topically daily.    . cloNIDine (CATAPRES) 0.3 MG tablet TAKE 1 TABLET BY MOUTH EVERY EIGHT HOURS. ONCE AT 8AM, 4PM, AND 12 MIDNIGHT. 90 tablet 3  . CRESTOR 5 MG tablet TAKE 1 TABLET BY MOUTH AT BEDTIME. 30 tablet 2  . diclofenac sodium (VOLTAREN) 1 % GEL Apply 2 g topically daily as needed (Pain). 100 g 0  . dicyclomine (BENTYL) 10 MG capsule Take 1 capsule by mouth 3 (three) times daily before meals.     . docusate sodium (COLACE) 100 MG capsule Take 2 capsules (200 mg total) by mouth at bedtime. 30 capsule 0  . fluticasone (FLONASE) 50 MCG/ACT nasal spray Place 2 sprays into both nostrils daily.    [name] glucose blood (ACCU-CHEK SMARTVIEW) test strip Use to test blood sugar 4 times daily as instructed. Dx: E10.65 375 each 1  . hydroxychloroquine (PLAQUENIL) 200 MG tablet Take 200 mg by  mouth daily.    . insulin aspart (NOVOLOG FLEXPEN) 100 UNIT/ML FlexPen Inject 20-22 Units into the skin 3 (three) times daily with meals. 30 mL 1  . Insulin Glargine (TOUJEO SOLOSTAR) 300 UNIT/ML SOPN Inject 55 Units into the skin at bedtime. 6 pen 1  . Insulin Pen Needle (CLICKFINE PEN NEEDLES) 32G X 4 MM MISC Use 4x a day 300 each 3  . lamoTRIgine (LAMICTAL) 100 MG tablet Take 150 mg by mouth daily.    [name] levothyroxine (SYNTHROID, LEVOTHROID) 50 MCG tablet TAKE 1 TABLET BY MOUTH EVERY MORNING. 30 tablet 0  . LORazepam (ATIVAN) 0.5 MG tablet Take 1 tablet (0.5 mg total) by mouth 3 (three) times daily. 90 tablet 2  . losartan (COZAAR) 100 MG tablet TAKE 1 TABLET BY MOUTH ONCE DAILY. 30 tablet 3  . lubiprostone (AMITIZA) 8 MCG capsule Take 1 capsule (8 mcg total) by mouth daily after breakfast. 20 capsule 0  . Magnesium 250 MG TABS Take 1 tablet by mouth daily.    . metoprolol  (LOPRESSOR) 50 MG tablet TAKE 1 TABLET BY MOUTH TWICE DAILY. 60 tablet 6  . montelukast (SINGULAIR) 10 MG tablet TAKE ONE TABLET BY MOUTH ONCE DAILY. 30 tablet 2  . morphine (MSIR) 15 MG tablet Take 15 mg by mouth 2 (two) times daily.    . Multiple Vitamins-Minerals (ONE-A-DAY 50 PLUS PO) Take 1 tablet by mouth daily.    [name] Nystatin (NYAMYC) 100000 UNIT/GM POWD APPLY TO AFFECTED AREA 4 TIMES DAILY. (Patient taking differently: Apply topically 4 (four) times daily. APPLY TO AFFECTED AREA 4 TIMES DAILY.) 30 g 2  . pregabalin (LYRICA) 75 MG capsule Take 75 mg by mouth 2 (two) times daily.     . promethazine (PHENERGAN) 12.5 MG tablet Take 1 tablet (12.5 mg total) by mouth every 6 (six) hours as needed for nausea or vomiting. 30 tablet 0  . RABEprazole (ACIPHEX) 20 MG tablet TAKE 1 TABLET BY MOUTH TWICE DAILY. 60 tablet 5  . RESTASIS 0.05 % ophthalmic emulsion Place 1 drop into both eyes 2 (two) times daily.     . risperiDONE (RISPERDAL) 0.5 MG tablet Take 1 tablet (0.5 mg total) by mouth at bedtime. 30 tablet 2  . sertraline (ZOLOFT) 100 MG tablet Take 2 tablets (200 mg total) by mouth daily. 60 tablet 2  . spironolactone (ALDACTONE) 25 MG tablet TAKE ONE TABLET BY MOUTH DAILY. 30 tablet 2  . traZODone (DESYREL) 150 MG tablet Take 2 tablets (300 mg total) by mouth at bedtime. 60 tablet 2  . vitamin E 400 UNIT capsule Take 400 Units by mouth daily.    [name] ZETIA 10 MG tablet TAKE ONE TABLET BY MOUTH ONCE DAILY. 30 tablet 2   Allergies  Allergen Reactions  . Cephalexin Hives  . Iron Nausea And Vomiting  . Milk-Related Compounds Other (See Comments)    Doesn't agree with stomach.   . Penicillins Hives  . Phenazopyridine Hcl Other (See Comments)    [place]     Active Problems:   * No active hospital problems. *  Vitals: There were no vitals taken for this visit. Lab results:No results found. However, due to the size of the patient record, not all encounters were searched. Please check Results  Review for a complete set of results. Radiology Results: No results found. General appearance: alert, cooperative, no distress and moderately obese Head: Normocephalic, without obvious abnormality, atraumatic Eyes: negative Nose: Nares normal. Septum midline. Mucosa normal. No drainage or sinus tenderness.  Throat: Bilateral mandibular buccal exostoses. Edentulous maxilla and mandible. Pharynx clear.  Resp: clear to auscultation bilaterally Cardio: regular rate and rhythm, S1, S2 normal, no murmur, click, rub or gallop  Assessment: Bilateral mandibular exostoses   Plan:Removal mandibular exostoses. General anesthesia. Day surgery.   Gae Bon 12/10/2014

## 2014-12-11 ENCOUNTER — Encounter (HOSPITAL_COMMUNITY): Payer: Self-pay | Admitting: *Deleted

## 2014-12-11 NOTE — Progress Notes (Signed)
Ms Mothershead has a long list of medication and CNA prepares medication and patient can not identify which medication is which.  I instructed patient to not take any medications Monday am.  I am requesting Dr Judd Lien office to fax a list of patient;s medications to [facility] pharmacy.

## 2014-12-13 MED ORDER — CLINDAMYCIN PHOSPHATE 600 MG/50ML IV SOLN
600.0000 mg | INTRAVENOUS | Status: AC
  Administered 2014-12-14: 600 mg via INTRAVENOUS
  Filled 2014-12-13: qty 50

## 2014-12-13 NOTE — Assessment & Plan Note (Signed)
High fall risk with repeated falls, , home safety reviewed, may require PT again, she has had this in the past

## 2014-12-13 NOTE — Assessment & Plan Note (Signed)
Uncontrolled , despite medication, needs supplies to assist with the problem

## 2014-12-13 NOTE — Assessment & Plan Note (Signed)
Stable, managed by psychiatry.  

## 2014-12-13 NOTE — Assessment & Plan Note (Signed)
No recent flare adequately controlled

## 2014-12-13 NOTE — Assessment & Plan Note (Signed)
Hyperlipidemia:Low fat diet discussed and encouraged.   Lipid Panel  Lab Results  Component Value Date   CHOL 121 07/15/2014   HDL 39* 07/15/2014   LDLCALC 57 07/15/2014   TRIG 123 07/15/2014   CHOLHDL 3.1 07/15/2014      Controlled, no change in medication

## 2014-12-13 NOTE — Assessment & Plan Note (Signed)
Managed by endo [name] is reminded of the importance of commitment to daily physical activity for 30 minutes or more, as able and the need to limit carbohydrate intake to 30 to 60 grams per meal to help with blood sugar control.   The need to take medication as prescribed, test blood sugar as directed, and to call between visits if there is a concern that blood sugar is uncontrolled is also discussed.   [name] is reminded of the importance of daily foot exam, annual eye examination, and good blood sugar, blood pressure and cholesterol control.  Diabetic Labs Latest Ref Rng 11/09/2014 11/03/2014 11/03/2014 11/03/2014 10/20/2014  HbA1c 4.6 - 6.5 % - - - - -  Microalbumin <2.0 mg/dL - - - - 1.3  Micro/Creat Ratio 0.0 - 30.0 mg/g - - - - 4.8  Chol 0 - 200 mg/dL - - - - -  HDL >=46 mg/dL - - - - -  Calc LDL 0 - 99 mg/dL - - - - -  Triglycerides <150 mg/dL - - - - -  Creatinine 0.50 - 0.99 mg/dL 1.05(H) 2.27(H) 2.85(H) 2.79(H) -  GFR >60.00 mL/min - - - 21.94(L) -   BP/Weight 11/10/2014 11/05/2014 11/03/2014 11/03/2014 10/20/2014 09/21/2014 0/17/4944  Systolic BP 967 591 638 90 466 599 357  Diastolic BP 68 68 017 50 60 60 70  Wt. (Lbs) 156.12 159.2 155 155.5 157 160 161.8  BMI 27.66 28.21 31.29 31.39 31.69 32.3 32.66  Some encounter information is confidential and restricted. Go to Review Flowsheets activity to see all data.   Foot/eye exam completion dates Latest Ref Rng 10/06/2014 10/03/2013  Eye Exam No Retinopathy No Retinopathy No Retinopathy  Foot exam Order - - -  Foot Form Completion - - -

## 2014-12-13 NOTE — Assessment & Plan Note (Signed)
Recurrent light headedness with imbalance and falls, imaging of carotid arteries neeeded

## 2014-12-13 NOTE — Assessment & Plan Note (Signed)
Controlled, no change in medication DASH diet and commitment to daily physical activity for a minimum of 30 minutes discussed and encouraged, as a part of hypertension management. The importance of attaining a healthy weight is also discussed.  BP/Weight 11/10/2014 11/05/2014 11/03/2014 11/03/2014 10/20/2014 09/21/2014 6/33/3545  Systolic BP 625 638 937 90 342 876 811  Diastolic BP 68 68 572 50 60 60 70  Wt. (Lbs) 156.12 159.2 155 155.5 157 160 161.8  BMI 27.66 28.21 31.29 31.39 31.69 32.3 32.66  Some encounter information is confidential and restricted. Go to Review Flowsheets activity to see all data.

## 2014-12-13 NOTE — Assessment & Plan Note (Signed)
Chronic pain management through pain clinic ?

## 2014-12-14 ENCOUNTER — Encounter (HOSPITAL_COMMUNITY): Payer: Self-pay | Admitting: *Deleted

## 2014-12-14 ENCOUNTER — Ambulatory Visit (HOSPITAL_COMMUNITY)
Admission: RE | Admit: 2014-12-14 | Discharge: 2014-12-14 | Disposition: A | Discharge status: Home / Self Care | Payer: Commercial Managed Care - HMO | Source: Ambulatory Visit | Attending: Oral Surgery | Admitting: Oral Surgery

## 2014-12-14 ENCOUNTER — Encounter (HOSPITAL_COMMUNITY)
Admission: RE | Disposition: A | Discharge status: Home / Self Care | Payer: Self-pay | Source: Ambulatory Visit | Attending: Oral Surgery

## 2014-12-14 ENCOUNTER — Telehealth: Payer: Self-pay | Admitting: Gastroenterology

## 2014-12-14 ENCOUNTER — Ambulatory Visit (HOSPITAL_COMMUNITY): Payer: Commercial Managed Care - HMO | Admitting: Anesthesiology

## 2014-12-14 DIAGNOSIS — Z7951 Long term (current) use of inhaled steroids: Secondary | ICD-10-CM | POA: Insufficient documentation

## 2014-12-14 DIAGNOSIS — Z87891 Personal history of nicotine dependence: Secondary | ICD-10-CM | POA: Insufficient documentation

## 2014-12-14 DIAGNOSIS — E039 Hypothyroidism, unspecified: Secondary | ICD-10-CM | POA: Diagnosis not present

## 2014-12-14 DIAGNOSIS — Z79899 Other long term (current) drug therapy: Secondary | ICD-10-CM | POA: Insufficient documentation

## 2014-12-14 DIAGNOSIS — M1990 Unspecified osteoarthritis, unspecified site: Secondary | ICD-10-CM | POA: Insufficient documentation

## 2014-12-14 DIAGNOSIS — Z7982 Long term (current) use of aspirin: Secondary | ICD-10-CM | POA: Insufficient documentation

## 2014-12-14 DIAGNOSIS — F319 Bipolar disorder, unspecified: Secondary | ICD-10-CM | POA: Insufficient documentation

## 2014-12-14 DIAGNOSIS — E119 Type 2 diabetes mellitus without complications: Secondary | ICD-10-CM | POA: Insufficient documentation

## 2014-12-14 DIAGNOSIS — M270 Developmental disorders of jaws: Secondary | ICD-10-CM | POA: Diagnosis not present

## 2014-12-14 DIAGNOSIS — Z794 Long term (current) use of insulin: Secondary | ICD-10-CM | POA: Insufficient documentation

## 2014-12-14 DIAGNOSIS — M278 Other specified diseases of jaws: Secondary | ICD-10-CM | POA: Diagnosis not present

## 2014-12-14 DIAGNOSIS — F209 Schizophrenia, unspecified: Secondary | ICD-10-CM | POA: Insufficient documentation

## 2014-12-14 DIAGNOSIS — G4730 Sleep apnea, unspecified: Secondary | ICD-10-CM | POA: Insufficient documentation

## 2014-12-14 DIAGNOSIS — R69 Illness, unspecified: Secondary | ICD-10-CM | POA: Diagnosis not present

## 2014-12-14 DIAGNOSIS — Z791 Long term (current) use of non-steroidal anti-inflammatories (NSAID): Secondary | ICD-10-CM | POA: Diagnosis not present

## 2014-12-14 DIAGNOSIS — I69354 Hemiplegia and hemiparesis following cerebral infarction affecting left non-dominant side: Secondary | ICD-10-CM | POA: Insufficient documentation

## 2014-12-14 DIAGNOSIS — J449 Chronic obstructive pulmonary disease, unspecified: Secondary | ICD-10-CM | POA: Diagnosis not present

## 2014-12-14 DIAGNOSIS — K029 Dental caries, unspecified: Secondary | ICD-10-CM | POA: Diagnosis not present

## 2014-12-14 DIAGNOSIS — R569 Unspecified convulsions: Secondary | ICD-10-CM | POA: Diagnosis not present

## 2014-12-14 DIAGNOSIS — I10 Essential (primary) hypertension: Secondary | ICD-10-CM | POA: Insufficient documentation

## 2014-12-14 DIAGNOSIS — K219 Gastro-esophageal reflux disease without esophagitis: Secondary | ICD-10-CM | POA: Diagnosis not present

## 2014-12-14 DIAGNOSIS — D649 Anemia, unspecified: Secondary | ICD-10-CM | POA: Diagnosis not present

## 2014-12-14 HISTORY — PX: TOOTH EXTRACTION: SHX859

## 2014-12-14 LAB — CBC
HCT: 35 % — ABNORMAL LOW (ref 36.0–46.0)
Hemoglobin: 10.3 g/dL — ABNORMAL LOW (ref 12.0–15.0)
MCH: 22.3 pg — AB (ref 26.0–34.0)
MCHC: 29.4 g/dL — AB (ref 30.0–36.0)
MCV: 75.8 fL — ABNORMAL LOW (ref 78.0–100.0)
PLATELETS: 220 10*3/uL (ref 150–400)
RBC: 4.62 MIL/uL (ref 3.87–5.11)
RDW: 14.6 % (ref 11.5–15.5)
WBC: 6.8 10*3/uL (ref 4.0–10.5)

## 2014-12-14 LAB — GLUCOSE, CAPILLARY
GLUCOSE-CAPILLARY: 131 mg/dL — AB (ref 65–99)
GLUCOSE-CAPILLARY: 200 mg/dL — AB (ref 65–99)

## 2014-12-14 LAB — BASIC METABOLIC PANEL
Anion gap: 9 (ref 5–15)
BUN: 22 mg/dL — AB (ref 6–20)
CHLORIDE: 106 mmol/L (ref 101–111)
CO2: 21 mmol/L — ABNORMAL LOW (ref 22–32)
CREATININE: 1.15 mg/dL — AB (ref 0.44–1.00)
Calcium: 9.1 mg/dL (ref 8.9–10.3)
GFR calc Af Amer: 57 mL/min — ABNORMAL LOW (ref >60)
GFR, EST NON AFRICAN AMERICAN: 49 mL/min — AB (ref >60)
Glucose, Bld: 142 mg/dL — ABNORMAL HIGH (ref 65–99)
Potassium: 3.9 mmol/L (ref 3.5–5.1)
SODIUM: 136 mmol/L (ref 135–145)

## 2014-12-14 SURGERY — EXTRACTION, TOOTH, MOLAR
Anesthesia: General | Site: Mouth | Laterality: Bilateral

## 2014-12-14 MED ORDER — PROPOFOL 10 MG/ML IV BOLUS
INTRAVENOUS | Status: AC
  Filled 2014-12-14: qty 20

## 2014-12-14 MED ORDER — OXYCODONE-ACETAMINOPHEN 5-325 MG PO TABS
1.0000 | ORAL_TABLET | ORAL | Status: DC | PRN
Start: 2014-12-14 — End: 2014-12-30

## 2014-12-14 MED ORDER — SODIUM CHLORIDE 0.9 % IR SOLN
Status: DC | PRN
  Administered 2014-12-14: 1000 mL

## 2014-12-14 MED ORDER — PROMETHAZINE HCL 25 MG/ML IJ SOLN: 6.2500 mg | INTRAMUSCULAR | Status: DC | PRN

## 2014-12-14 MED ORDER — MIDAZOLAM HCL 2 MG/2ML IJ SOLN: 0.5000 mg | Freq: Once | INTRAMUSCULAR | Status: DC | PRN

## 2014-12-14 MED ORDER — FENTANYL CITRATE (PF) 100 MCG/2ML IJ SOLN
25.0000 ug | INTRAMUSCULAR | Status: DC | PRN
  Administered 2014-12-14 (×2): 50 ug via INTRAVENOUS

## 2014-12-14 MED ORDER — ONDANSETRON HCL 4 MG/2ML IJ SOLN
INTRAMUSCULAR | Status: AC
  Filled 2014-12-14: qty 2

## 2014-12-14 MED ORDER — LIDOCAINE-EPINEPHRINE 2 %-1:100000 IJ SOLN
INTRAMUSCULAR | Status: DC | PRN
  Administered 2014-12-14: 10 mL

## 2014-12-14 MED ORDER — MEPERIDINE HCL 25 MG/ML IJ SOLN: 6.2500 mg | INTRAMUSCULAR | Status: DC | PRN

## 2014-12-14 MED ORDER — FENTANYL CITRATE (PF) 250 MCG/5ML IJ SOLN
INTRAMUSCULAR | Status: AC
  Filled 2014-12-14: qty 5

## 2014-12-14 MED ORDER — SUCCINYLCHOLINE CHLORIDE 20 MG/ML IJ SOLN
INTRAMUSCULAR | Status: AC
  Filled 2014-12-14: qty 1

## 2014-12-14 MED ORDER — LIDOCAINE-EPINEPHRINE 2 %-1:100000 IJ SOLN
INTRAMUSCULAR | Status: AC
  Filled 2014-12-14: qty 1

## 2014-12-14 MED ORDER — LIDOCAINE HCL (CARDIAC) 20 MG/ML IV SOLN
INTRAVENOUS | Status: AC
  Filled 2014-12-14: qty 5

## 2014-12-14 MED ORDER — PHENYLEPHRINE HCL 10 MG/ML IJ SOLN
INTRAMUSCULAR | Status: DC | PRN
  Administered 2014-12-14 (×3): 80 ug via INTRAVENOUS

## 2014-12-14 MED ORDER — METOPROLOL TARTRATE 50 MG PO TABS
50.0000 mg | ORAL_TABLET | Freq: Once | ORAL | Status: AC
  Administered 2014-12-14: 50 mg via ORAL

## 2014-12-14 MED ORDER — SUCCINYLCHOLINE CHLORIDE 20 MG/ML IJ SOLN
INTRAMUSCULAR | Status: DC | PRN
  Administered 2014-12-14: 100 mg via INTRAVENOUS

## 2014-12-14 MED ORDER — EPHEDRINE SULFATE 50 MG/ML IJ SOLN
INTRAMUSCULAR | Status: DC | PRN
  Administered 2014-12-14: 10 mg via INTRAVENOUS

## 2014-12-14 MED ORDER — MIDAZOLAM HCL 5 MG/5ML IJ SOLN
INTRAMUSCULAR | Status: DC | PRN
  Administered 2014-12-14: 1 mg via INTRAVENOUS

## 2014-12-14 MED ORDER — ROCURONIUM BROMIDE 50 MG/5ML IV SOLN
INTRAVENOUS | Status: AC
  Filled 2014-12-14: qty 1

## 2014-12-14 MED ORDER — METOPROLOL TARTRATE 50 MG PO TABS
ORAL_TABLET | ORAL | Status: AC
  Filled 2014-12-14: qty 1

## 2014-12-14 MED ORDER — OXYMETAZOLINE HCL 0.05 % NA SOLN
NASAL | Status: AC
  Filled 2014-12-14: qty 15

## 2014-12-14 MED ORDER — SODIUM CHLORIDE 0.9 % IJ SOLN
INTRAMUSCULAR | Status: AC
  Filled 2014-12-14: qty 10

## 2014-12-14 MED ORDER — EPHEDRINE SULFATE 50 MG/ML IJ SOLN
INTRAMUSCULAR | Status: AC
  Filled 2014-12-14: qty 1

## 2014-12-14 MED ORDER — MIDAZOLAM HCL 2 MG/2ML IJ SOLN
INTRAMUSCULAR | Status: AC
  Filled 2014-12-14: qty 4

## 2014-12-14 MED ORDER — FENTANYL CITRATE (PF) 100 MCG/2ML IJ SOLN
INTRAMUSCULAR | Status: AC
  Administered 2014-12-14: 50 ug via INTRAVENOUS
  Filled 2014-12-14: qty 2

## 2014-12-14 MED ORDER — PROPOFOL 10 MG/ML IV BOLUS
INTRAVENOUS | Status: DC | PRN
  Administered 2014-12-14: 50 mg via INTRAVENOUS
  Administered 2014-12-14: 110 mg via INTRAVENOUS

## 2014-12-14 MED ORDER — CLINDAMYCIN PHOSPHATE 600 MG/50ML IV SOLN: 600.0000 mg | Freq: Four times a day (QID) | INTRAVENOUS | Status: DC

## 2014-12-14 MED ORDER — FENTANYL CITRATE (PF) 100 MCG/2ML IJ SOLN
INTRAMUSCULAR | Status: DC | PRN
  Administered 2014-12-14 (×5): 50 ug via INTRAVENOUS

## 2014-12-14 MED ORDER — LACTATED RINGERS IV SOLN
INTRAVENOUS | Status: DC | PRN
  Administered 2014-12-14: 07:00:00 via INTRAVENOUS

## 2014-12-14 MED ORDER — ONDANSETRON HCL 4 MG/2ML IJ SOLN
INTRAMUSCULAR | Status: DC | PRN
  Administered 2014-12-14: 4 mg via INTRAVENOUS

## 2014-12-14 MED ORDER — PHENYLEPHRINE 40 MCG/ML (10ML) SYRINGE FOR IV PUSH (FOR BLOOD PRESSURE SUPPORT)
PREFILLED_SYRINGE | INTRAVENOUS | Status: AC
  Filled 2014-12-14: qty 10

## 2014-12-14 SURGICAL SUPPLY — 32 items
BLADE SURG 15 STRL LF DISP TIS (BLADE) ×1 IMPLANT
BLADE SURG 15 STRL SS (BLADE)
BUR CROSS CUT FISSURE 1.6 (BURR) ×2 IMPLANT
BUR CROSS CUT FISSURE 1.6MM (BURR) ×1
BUR EGG ELITE 4.0 (BURR) ×1 IMPLANT
BUR EGG ELITE 4.0MM (BURR) ×1
CANISTER SUCTION 2500CC (MISCELLANEOUS) ×3 IMPLANT
COVER SURGICAL LIGHT HANDLE (MISCELLANEOUS) ×3 IMPLANT
GAUZE PACKING FOLDED 2  STR (GAUZE/BANDAGES/DRESSINGS) ×2
GAUZE PACKING FOLDED 2 STR (GAUZE/BANDAGES/DRESSINGS) ×1 IMPLANT
GAUZE SPONGE 4X4 16PLY XRAY LF (GAUZE/BANDAGES/DRESSINGS) IMPLANT
GLOVE BIO SURGEON STRL SZ 6.5 (GLOVE) ×2 IMPLANT
GLOVE BIO SURGEON STRL SZ7.5 (GLOVE) ×3 IMPLANT
GLOVE BIO SURGEONS STRL SZ 6.5 (GLOVE) ×1
GLOVE BIOGEL PI IND STRL 7.0 (GLOVE) ×1 IMPLANT
GLOVE BIOGEL PI INDICATOR 7.0 (GLOVE) ×2
GOWN STRL REUS W/ TWL LRG LVL3 (GOWN DISPOSABLE) ×1 IMPLANT
GOWN STRL REUS W/ TWL XL LVL3 (GOWN DISPOSABLE) ×1 IMPLANT
GOWN STRL REUS W/TWL LRG LVL3 (GOWN DISPOSABLE) ×3
GOWN STRL REUS W/TWL XL LVL3 (GOWN DISPOSABLE) ×3
KIT BASIN OR (CUSTOM PROCEDURE TRAY) ×3 IMPLANT
KIT ROOM TURNOVER OR (KITS) ×3 IMPLANT
NEEDLE 22X1 1/2 (OR ONLY) (NEEDLE) ×5 IMPLANT
NS IRRIG 1000ML POUR BTL (IV SOLUTION) ×3 IMPLANT
PAD ARMBOARD 7.5X6 YLW CONV (MISCELLANEOUS) ×6 IMPLANT
SUT CHROMIC 3 0 PS 2 (SUTURE) ×6 IMPLANT
TOWEL OR 17X26 10 PK STRL BLUE (TOWEL DISPOSABLE) ×3 IMPLANT
TRAY ENT MC OR (CUSTOM PROCEDURE TRAY) ×3 IMPLANT
TUBE CONNECTING 12'X1/4 (SUCTIONS)
TUBE CONNECTING 12X1/4 (SUCTIONS) IMPLANT
TUBING IRRIGATION (MISCELLANEOUS) IMPLANT
YANKAUER SUCT BULB TIP NO VENT (SUCTIONS) ×3 IMPLANT

## 2014-12-14 NOTE — Anesthesia Procedure Notes (Signed)
Procedure Name: Intubation Date/Time: 12/14/2014 7:35 AM Performed by: Rebekah Chesterfield L Pre-anesthesia Checklist: Patient identified, Emergency Drugs available, Suction available, Patient being monitored and Timeout performed Patient Re-evaluated:Patient Re-evaluated prior to inductionOxygen Delivery Method: Circle system utilized Preoxygenation: Pre-oxygenation with 100% oxygen Intubation Type: IV induction Laryngoscope Size: Mac and 3 Grade View: Grade I Nasal Tubes: Right, Magill forceps - small, utilized and Nasal prep performed Tube size: 7.0 mm Number of attempts: 1 Placement Confirmation: ETT inserted through vocal cords under direct vision,  positive ETCO2 and breath sounds checked- equal and bilateral Secured at: 27 cm Tube secured with: Tape Dental Injury: Teeth and Oropharynx as per pre-operative assessment  Comments: Sequential dilitation c NPA 7.0  + 7.5 s trauma

## 2014-12-14 NOTE — Telephone Encounter (Signed)
Patient needs nonurgent follow up with RMR only for ?pelvic floor dysfunction.

## 2014-12-14 NOTE — H&P (Signed)
H&P documentation  -History and Physical Reviewed  -Patient has been re-examined  -No change in the plan of care  [name]  

## 2014-12-14 NOTE — Op Note (Signed)
12/14/2014  8:07 AM  PATIENT:  [name]  64 y.o. female  PRE-OPERATIVE DIAGNOSIS:  Bilateral mandibular exostoses  POST-OPERATIVE DIAGNOSIS:  SAME + Right mandibular lingual torus =  PROCEDURE:  Procedure(s): REMOVAL OF BILATERAL MANDIBULAR EXOSTOSES  SURGEON:  Surgeon(s): Diona Browner, DDS  ANESTHESIA:   local and general  EBL:  minimal  DRAINS: none   SPECIMEN:  No Specimen  COUNTS:  YES  PLAN OF CARE: Discharge to home after PACU  PATIENT DISPOSITION:  PACU - hemodynamically stable.   PROCEDURE DETAILS: Dictation # [id number]  Gae Bon, DMD 12/14/2014 8:07 AM

## 2014-12-14 NOTE — Anesthesia Preprocedure Evaluation (Signed)
Anesthesia Evaluation  Patient identified by MRN, date of birth, ID band Patient awake    Reviewed: Allergy & Precautions, NPO status , Patient's Chart, lab work & pertinent test results  History of Anesthesia Complications Negative for: history of anesthetic complications  Airway Mallampati: I  TM Distance: >3 FB Neck ROM: Full    Dental  (+) Edentulous Upper, Edentulous Lower   Pulmonary sleep apnea and Continuous Positive Airway Pressure Ventilation , COPD,  COPD inhaler, former smoker,    breath sounds clear to auscultation       Cardiovascular hypertension, Pt. on medications and Pt. on home beta blockers  Rhythm:Regular Rate:Normal     Neuro/Psych Seizures -, Well Controlled,  PSYCHIATRIC DISORDERS Anxiety Depression Bipolar Disorder Schizophrenia CVA    GI/Hepatic Neg liver ROS, GERD  Medicated and Controlled,  Endo/Other  diabetes (glu 131), Insulin Dependent  Renal/GU negative Renal ROS     Musculoskeletal  (+) Arthritis ,   Abdominal   Peds  Hematology  (+) Blood dyscrasia (Hb 10.3), ,   Anesthesia Other Findings   Reproductive/Obstetrics                             Anesthesia Physical Anesthesia Plan  ASA: III  Anesthesia Plan: General   Post-op Pain Management:    Induction: Intravenous  Airway Management Planned: Nasal ETT  Additional Equipment:   Intra-op Plan:   Post-operative Plan: Extubation in OR  Informed Consent: I have reviewed the patients History and Physical, chart, labs and discussed the procedure including the risks, benefits and alternatives for the proposed anesthesia with the patient or authorized representative who has indicated his/her understanding and acceptance.   Dental advisory given  Plan Discussed with: CRNA and Surgeon  Anesthesia Plan Comments: (Plan routine monitors, GETA)        Anesthesia Quick Evaluation

## 2014-12-14 NOTE — Op Note (Signed)
NAME:  PAGETanai, Bouler                  ACCOUNT NO.:  [id number]  MEDICAL RECORD NO.:  [id number]  LOCATION:  [facility]                         FACILITY:  [facility]  PHYSICIAN:  Gae Bon, M.D.  DATE OF BIRTH:  05-12-1950  DATE OF PROCEDURE: DATE OF DISCHARGE:                              OPERATIVE REPORT   PREOPERATIVE DIAGNOSIS:  Bilateral mandibular exostoses.  POSTOPERATIVE DIAGNOSIS:  Bilateral mandibular exostoses plus right mandibular lingual torus.  PROCEDURE:  Removal of bilateral mandibular buccal exostoses and removal of right mandibular lingual torus.  SURGEON:  Gae Bon, M.D.  ANESTHESIA:  Alvera Singh attending, nasal intubation.  INDICATIONS FOR PROCEDURE:  The patient is a 64 year old female, with multiple medical problems, who is edentulous, who underwent a split thickness skin graft from the thigh to the mandible in 2011 to perform vestibuloplasty, so that a lower denture could be fabricated.  She did fine after the surgery, but recently has begun having difficulty wearing a denture and was seen and found to have a stable skin graft healing well, but there were multiple buccal exostoses developing underneath the skin graft and it was recommended these be removed under general anesthesia.  PROCEDURE IN DETAIL:  The patient was taken to the operating room, placed on the table in supine position.  General anesthesia was administered intravenously and a nasal endotracheal tube was placed and secured.  The eyes were protected and the patient was draped for the procedure.  Time-out was performed.  The posterior pharynx was suctioned.  A throat pack was placed.  A 2% lidocaine with 1:100,000 epinephrine was infiltrated in an inferior alveolar block on the right and left side and a buccal infiltration of the mandible in the area of the exostoses.  A total of 10 mL was utilized.  A bite block was placed on the right side of the mouth and a sweetheart retractor  was used to retract the tongue and a #15 blade was used to make an incision in the alveolar crest beginning in the left molar region, carrying forward to the midline, where a vertical releasing incision was created.  There was a small frenal attachment in the left canine area that had formed, where the skin graft joined the regular mucosa and this was excised with a 15 blade to perform a splenectomy.  The periosteum was reflected until the bone was exposed and then bony exostoses were removed using the egg- shaped burr and bone file.  The bite block and sweetheart retractor were repositioned the other side of the mouth and a similar incision was made on the right mandibular crest.  The periosteum was reflected with a periosteal elevator and bony exostoses were removed.  There was also a prominent lingual exostoses in the posterior mandible that had not been identified before and this was reduced as well.  Then, the areas were irrigated and closed with 3-0 chromic.  The oral cavity was inspected, found to have good contour hemostasis and closure.  The oral cavity was irrigated and suctioned.  Throat pack was removed.  The patient was awakened taken to the recovery room, breathing spontaneously in good condition.  ESTIMATED BLOOD LOSS:  Minimal.  COMPLICATIONS:  None.  SPECIMENS:  None.     Gae Bon, M.D.     SMJ/MEDQ  D:  12/14/2014  T:  12/14/2014  Job:  [id number]

## 2014-12-14 NOTE — Transfer of Care (Signed)
Immediate Anesthesia Transfer of Care Note  Patient: [name]  Procedure(s) Performed: Procedure(s): REMOVAL OF BILATERAL MANDIBULAR EXOSTOSES (Bilateral)  Patient Location: PACU  Anesthesia Type:General  Level of Consciousness: awake, alert , oriented and patient cooperative  Airway & Oxygen Therapy: Patient Spontanous Breathing and Patient connected to nasal cannula oxygen  Post-op Assessment: Report given to RN, Post -op Vital signs reviewed and stable and Patient moving all extremities  Post vital signs: Reviewed and stable  Last Vitals:  Filed Vitals:   12/14/14 0818  BP:   Pulse:   Temp: 36.8 C  Resp:     Complications: No apparent anesthesia complications

## 2014-12-14 NOTE — Anesthesia Postprocedure Evaluation (Signed)
  Anesthesia Post-op Note  Patient: [name]  Procedure(s) Performed: Procedure(s): REMOVAL OF BILATERAL MANDIBULAR EXOSTOSES (Bilateral)  Patient Location: PACU  Anesthesia Type:General  Level of Consciousness: awake, alert , oriented and patient cooperative  Airway and Oxygen Therapy: Patient Spontanous Breathing  Post-op Pain: none  Post-op Assessment: Post-op Vital signs reviewed, Patient's Cardiovascular Status Stable, Respiratory Function Stable, Patent Airway, No signs of Nausea or vomiting and Pain level controlled              Post-op Vital Signs: Reviewed and stable  Last Vitals:  Filed Vitals:   12/14/14 0924  BP: 171/61  Pulse: 66  Temp:   Resp: 18    Complications: No apparent anesthesia complications

## 2014-12-15 ENCOUNTER — Encounter: Payer: Self-pay | Admitting: Internal Medicine

## 2014-12-15 ENCOUNTER — Encounter (HOSPITAL_COMMUNITY): Payer: Self-pay | Admitting: Oral Surgery

## 2014-12-15 NOTE — Telephone Encounter (Signed)
APPT MADE AND LETTER SENT  °

## 2014-12-19 DIAGNOSIS — M5440 Lumbago with sciatica, unspecified side: Secondary | ICD-10-CM | POA: Diagnosis not present

## 2014-12-21 ENCOUNTER — Other Ambulatory Visit: Payer: Self-pay | Admitting: Internal Medicine

## 2014-12-21 ENCOUNTER — Other Ambulatory Visit: Payer: Self-pay | Admitting: Family Medicine

## 2014-12-21 DIAGNOSIS — L603 Nail dystrophy: Secondary | ICD-10-CM | POA: Diagnosis not present

## 2014-12-21 DIAGNOSIS — I739 Peripheral vascular disease, unspecified: Secondary | ICD-10-CM | POA: Diagnosis not present

## 2014-12-21 DIAGNOSIS — E1051 Type 1 diabetes mellitus with diabetic peripheral angiopathy without gangrene: Secondary | ICD-10-CM | POA: Diagnosis not present

## 2014-12-21 DIAGNOSIS — G4733 Obstructive sleep apnea (adult) (pediatric): Secondary | ICD-10-CM | POA: Diagnosis not present

## 2014-12-22 ENCOUNTER — Encounter: Payer: Self-pay | Admitting: Internal Medicine

## 2014-12-22 ENCOUNTER — Ambulatory Visit (INDEPENDENT_AMBULATORY_CARE_PROVIDER_SITE_OTHER): Payer: Commercial Managed Care - HMO | Admitting: Internal Medicine

## 2014-12-22 ENCOUNTER — Other Ambulatory Visit (INDEPENDENT_AMBULATORY_CARE_PROVIDER_SITE_OTHER): Payer: Commercial Managed Care - HMO | Admitting: *Deleted

## 2014-12-22 VITALS — BP 122/60 | HR 68 | Temp 98.5°F | Resp 12 | Wt 153.8 lb

## 2014-12-22 DIAGNOSIS — E1022 Type 1 diabetes mellitus with diabetic chronic kidney disease: Secondary | ICD-10-CM

## 2014-12-22 DIAGNOSIS — E039 Hypothyroidism, unspecified: Secondary | ICD-10-CM | POA: Diagnosis not present

## 2014-12-22 DIAGNOSIS — E1065 Type 1 diabetes mellitus with hyperglycemia: Secondary | ICD-10-CM

## 2014-12-22 DIAGNOSIS — E119 Type 2 diabetes mellitus without complications: Secondary | ICD-10-CM

## 2014-12-22 DIAGNOSIS — Z794 Long term (current) use of insulin: Secondary | ICD-10-CM | POA: Diagnosis not present

## 2014-12-22 DIAGNOSIS — IMO0002 Reserved for concepts with insufficient information to code with codable children: Secondary | ICD-10-CM

## 2014-12-22 DIAGNOSIS — E042 Nontoxic multinodular goiter: Secondary | ICD-10-CM | POA: Diagnosis not present

## 2014-12-22 DIAGNOSIS — N182 Chronic kidney disease, stage 2 (mild): Secondary | ICD-10-CM

## 2014-12-22 DIAGNOSIS — IMO0001 Reserved for inherently not codable concepts without codable children: Secondary | ICD-10-CM

## 2014-12-22 LAB — POCT GLYCOSYLATED HEMOGLOBIN (HGB A1C): Hemoglobin A1C: 6.2

## 2014-12-22 NOTE — Patient Instructions (Addendum)
Please continue: - Toujeo 55 units at bedtime - Novolog:  20 units before a regular meal  22 units before a large meal   Please return in 3 month with your sugar log.

## 2014-12-22 NOTE — Progress Notes (Signed)
Patient ID: [name], female   DOB: Jul 11, 1950, 64 y.o.   MRN: [id number]  HPI: [name] is a 64 y.o.-year-old female, initially referred by her PCP, [name], for management of DM2, dx 1995, insulin-dependent since 1997, uncontrolled, with complications (gastroparesis, cerebrovasc. Ds-  H/o stroke, PN),  Hypothyroidism, thyroid nodules.  Last visit 3 mo ago.  DM2: Last hemoglobin A1c was: Lab Results  Component Value Date   HGBA1C 7.0* 09/21/2014   HGBA1C 7.3* 06/02/2014   HGBA1C 7.6* 03/11/2014  10/28/2013: HbA1c 8.5%  She is now on: - Toujeo to 55 units at bedtime - Novolog:  20 units before a regular meal  22 units before a large meal  Could not tolerate Metformin >> nausea.   Pt checks her sugars 4 a day and they are higher: - am: 153, 200-300, some 400s >> 68-142, 164 >> 89-139, 161 >> 88-147, 184 >> 98, 100-222, 310 >> 81-128, 166 - 2h after b'fast: n/c  - before lunch: 192-243 >> 78-192 >> 134-202 >> 90, 99-150, 198, 288 (forgot insulin) >> 84, 96-219, 309 >> 71-131, 170, 261 - 2h after lunch: n/c - before dinner: 200-292 >> 55 x1, 77-174 >> 91, 100-201 >> 96, 125-201, 260 >> 98-301, 398 >> 66, 81-150, 170 - 2h after dinner: n/c - bedtime: 190-200s, rarely 300s >> 72, 94-210 >> 90, 103-198 >> 101-193 >> 93, 160-218, 290, 398 >> 98-175 - nighttime: n/c No lows. Lowest sugar was 69 >> 88x1 >> 84 x1; she has hypoglycemia awareness at 70.  Highest sugar was 200s >> 390s >> 200s  Pt's meals are: - Breakfast: eggs, cereals, fruit, oatmeal, bacon - Lunch: sandwich, beef, mashed potatoes,diet soda - Dinner: chicken, beef, fish, potatoes, vegetables - Snacks: 1-2: fruit  She exercises 1x a day - walks.  - + mild CKD, last BUN/creatinine:  Lab Results  Component Value Date   BUN 22* 12/14/2014   CREATININE 1.15* 12/14/2014  On Losartan. - last set of lipids: Lab Results  Component Value Date   CHOL 121 07/15/2014   HDL 39* 07/15/2014   LDLCALC 57  07/15/2014   TRIG 123 07/15/2014   CHOLHDL 3.1 07/15/2014  On Ezetimibe + Crestor. - last eye exam was in 10/06/2014. No DR. Had cataract sx B. - + numbness and tingling in her leg L leg (affected by stroke) but also in R. Sees podiatry.  Hypothyroidism: - dx > 10 years ago. - On Levoxyl 50 mcg - did not decrease the dose as advised.  - in am (at 7 am) - fasting - with water - >30 min b'f b'fast - no Ca - no Fe - + MVI at noon - + Aciphex moved at noon  Last TSH: Lab Results  Component Value Date   TSH 1.352 11/09/2014  Previously: Lab Results  Component Value Date   TSH 1.453 08/02/2011   TSH 0.666 09/14/2009   TSH 0.295  05/30/2009   TSH 1.126 03/18/2009   TSH 0.104  09/15/2008   TSH 0.066  09/14/2008   TSH 1.041  02/03/2008   TSH 0.992 09/25/2007   TSH 0.257 08/25/2007   TSH 1.181 05/23/2006   She has a h/o thyroid nodules. + dysphagia, + hoarseness.  Reviewed thyroid U/S (10/2013):  Right thyroid lobe: 3.7 x 1.0 x 1.8 cm   Left thyroid lobe: 3.5 x 1.5 x 1.6 cm   Isthmus: 0.5 cm   Focal nodules: There is a 0.3 mm calcified nodule in the right midzone.  There is a 0.6 x 0.7 x 0.5 cm hypoechoic nodule in the  lateral aspect of the right midzone. There is a 0.7 x 0.6 x 0.5 cm hyperechoic nodule in the lateral aspect of the right lower pole.  There is a 2.2 x 1.1 x 1.7 cm complex solid nodule in the inferior aspect of the isthmus extending adjacent to the left lower pole.  There is a 1.4 x 2.1 x 1.4 cm complex solid nodule in the left lower pole.   Lymphadenopathy: None visualized.  Repeat U/S (01/2014): stable appearance of nodules  FNA (2014) x2: benign   ROS: Constitutional: no weight gain/loss, no fatigue, + subjective hypothermia, + nocturia Eyes: no blurry vision, no xerophthalmia ENT: no sore throat, no nodules palpated in throat, no dysphagia/no odynophagia, no hoarseness Cardiovascular: no CP/SOB/palpitations/leg swelling Respiratory: no  cough/SOB Gastrointestinal: + N/no V/D/C Musculoskeletal: no muscle/joint aches Skin: no rashes Neurological: no tremors/numbness/tingling/dizziness  I reviewed pt's medications, allergies, PMH, social hx, family hx, and changes were documented in the history of present illness. Otherwise, unchanged from my initial visit note - except stopped Remeron, started Risperdal: Past Medical History  Diagnosis Date  . Bipolar disorder ([place])   . CVA (cerebral infarction)   . Pancreatitis 2006    due to Depakote with normal EUS   . Osteoporosis   . Chronic back pain   . Trigger finger   . Anxiety disorder   . Hypertension   . Migraines     chronic headaches  . Diverticulosis     TCS 9/08 by Dr. Delfin Edis for diarrhea . Bx for micro scopic colitis negative.   . Schatzki's ring     non critical / EGD with ED 8/2011with RMR  . S/P colonoscopy [id number], 2011    left-sided diverticula, hx of simple adenomas . 2011, random bx negative for microscopic colitis  . Glaucoma   . Allergic rhinitis   . Hypothyroidism     thyroid goiter  . Anemia   . Blood transfusion   . GERD (gastroesophageal reflux disease)   . Stroke [facility])     left sided weakness  . Seizures ([place])     unknown etiology-on meds-last seizure was 3 yerars ago  . Anxiety   . Depression   . Metabolic encephalopathy 07/05/9561  . Sleep apnea     on CPAP  . Arthritis   . Gum symptoms     infection on antibiotic  . Chronic neck pain   . Mononeuritis lower limb   . Frequent falls   . Diabetes mellitus     Type II  . Complication of anesthesia     "hard to put sleep"   Past Surgical History  Procedure Laterality Date  . Abdominal hysterectomy  1978  . Cholecystectomy  1984  . Ovarian cyst removal    . Carpal tunnel release Left 07/22/04    Dr. Aline Brochure  . Breast reduction surgery  1994  . Cataract extraction Bilateral   . Biopsy thyroid  2009  . Surgical excision of 3 tumors from right thigh and right buttock  and  left upper thigh  2010  . Back surgery  July 2012  . Spine surgery  09/29/2010    Dr. Rolena Infante  . Maloney dilation  12/29/2010    RMR;  . Esophagogastroduodenoscopy  12/29/2010    Rourk-Retained food in the esophagus and stomach, small hiatal hernia, status post Maloney dilation of the esophagus  . Craniotomy  11/23/2011    Procedure:  CRANIOTOMY TUMOR EXCISION;  Surgeon: Hosie Spangle, MD;  Location: [place] [facility];  Service: Neurosurgery;  Laterality: N/A;  Craniotomy for tumor resection  . Brain surgery  11/2011    resection of meningioma  . Colonoscopy N/A 09/25/2012    GEX:BMWUXLK diverticulosis.  colonic polyp-removed : tubular adenoma  . Esophagogastroduodenoscopy N/A 09/25/2012    GMW:NUUVOZDG atonic baggy esophagus status post Maloney dilation 62 F. Hiatal hernia  . Givens capsule study N/A 01/15/2013    NORMAL.   . Bacterial overgrowth test N/A 05/05/2013    Procedure: BACTERIAL OVERGROWTH TEST;  Surgeon: Daneil Dolin, MD;  [facility];  Service: Endoscopy;  Laterality: N/A;  7:30  . Transthoracic echocardiogram  2010    EF 60-65%, mild conc LVH, grade 1 diastolic dysfunction; mildly calcified MV annulus with mildly thickened leaflets, mildly calcified MR annulus  . Nm myocar perf wall motion  2006    "relavtiely normal" persantine, mild anterior thinning (breast attenuation artifact), no region of scar/ischemia  . Cardiac catheterization  05/10/2005    normal coronaries, normal LV systolic function and EF (Dr. Jackie Plum)  . Tooth extraction Bilateral 12/14/2014    Procedure: REMOVAL OF BILATERAL MANDIBULAR EXOSTOSES;  Surgeon: Diona Browner, DDS;  Location: [facility];  Service: Oral Surgery;  Laterality: Bilateral;   History   Social History  . Marital Status: Divorced    Spouse Name: N/A    Number of Children: 1  . Years of Education: 12   Occupational History  . disabled     Social History Main Topics  . Smoking status: Current Every Day Smoker -- 0.25 packs/day  for 7 years    Types: Cigarettes  . Smokeless tobacco: Never Used     Comment: "started back but off now for 3 months" (08/18/13)  . Alcohol Use: No     Comment:    . Drug Use: No   Current Outpatient Prescriptions on File Prior to Visit  Medication Sig Dispense Refill  . aspirin EC 81 MG tablet Take 81 mg by mouth daily.    . Cholecalciferol (D 5000) 5000 UNITS capsule Take 5,000 Units by mouth daily.    . cloNIDine (CATAPRES) 0.3 MG tablet TAKE 1 TABLET BY MOUTH EVERY EIGHT HOURS. ONCE AT 8AM, 4PM, AND 12 MIDNIGHT. 90 tablet 3  . diclofenac sodium (VOLTAREN) 1 % GEL Apply 2 g topically daily as needed (Pain).    [name] dicyclomine (BENTYL) 10 MG capsule Take 10 mg by mouth 3 (three) times daily before meals.     [name] glucose blood (ACCU-CHEK AVIVA PLUS) test strip Use to test blood sugar 4 times daily as instructed. Dx code: E10.65 400 each 3  . hydrochlorothiazide (HYDRODIURIL) 25 MG tablet Take 1 tablet (25 mg total) by mouth daily. 30 tablet 3  . HYDROmorphone (DILAUDID) 4 MG tablet Take 4 mg by mouth 3 (three) times daily as needed. For pain    . hydroxychloroquine (PLAQUENIL) 200 MG tablet Take 200 mg by mouth daily.    . insulin aspart (NOVOLOG FLEXPEN) 100 UNIT/ML FlexPen Inject 14-20 Units into the skin 3 (three) times daily with meals. (Patient taking differently: Inject 16 Units into the skin 3 (three) times daily with meals. ) 45 mL 1  . Insulin Glargine (LANTUS) 100 UNIT/ML Solostar Pen Inject 50 Units into the skin daily at 10 pm. 45 mL 1  . Insulin Pen Needle (UNIFINE PENTIPS) 31G X 6 MM MISC Use to inject insulin 4 times daily as  directed. 390 each 3  .      . lamoTRIgine (LAMICTAL) 100 MG tablet Take 1.5 tablets (150 mg total) by mouth every morning. 45 tablet 5  . levothyroxine (LEVOXYL) 50 MCG tablet Take 50 mcg by mouth every morning.     [name] LORazepam (ATIVAN) 0.5 MG tablet Take 0.5 mg by mouth at bedtime.     [name] losartan (COZAAR) 100 MG tablet TAKE 1 TABLET BY MOUTH ONCE DAILY. 30  tablet 3  . methocarbamol (ROBAXIN) 500 MG tablet Take 500 mg by mouth every morning.     . metoprolol (LOPRESSOR) 50 MG tablet TAKE 1 TABLET BY MOUTH TWICE DAILY. 60 tablet 5  . mirtazapine (REMERON) 30 MG tablet Take 30 mg by mouth at bedtime.     . mometasone-formoterol (DULERA) 100-5 MCG/ACT AERO Inhale 2 puffs into the lungs 2 (two) times daily. 1 Inhaler 0  . montelukast (SINGULAIR) 10 MG tablet TAKE ONE TABLET BY MOUTH ONCE DAILY. 30 tablet 2  . Multiple Vitamins-Minerals (ONE-A-DAY 50 PLUS PO) Take 1 tablet by mouth daily.    . pregabalin (LYRICA) 75 MG capsule Take 75 mg by mouth 2 (two) times daily.     . RABEprazole (ACIPHEX) 20 MG tablet TAKE 1 TABLET BY MOUTH TWICE DAILY. (Patient not taking: Reported on 03/03/2014) 60 tablet 5  . RABEprazole (ACIPHEX) 20 MG tablet Take 20 mg by mouth every morning.    . RESTASIS 0.05 % ophthalmic emulsion Place 1 drop into both eyes 2 (two) times daily.     . rosuvastatin (CRESTOR) 5 MG tablet TAKE 1 TABLET BY MOUTH AT BEDTIME. (Patient taking differently: Take 5 mg by mouth at bedtime. TAKE 1 TABLET BY MOUTH AT BEDTIME.) 30 tablet 3  . sertraline (ZOLOFT) 100 MG tablet Take 100 mg by mouth every morning.     [name] spironolactone (ALDACTONE) 25 MG tablet TAKE ONE TABLET BY MOUTH DAILY. 30 tablet 3  . terbinafine (LAMISIL) 250 MG tablet Take 1 tablet by mouth daily.    [name] thioridazine (MELLARIL) 10 MG tablet Take 20 mg by mouth at bedtime.     . traZODone (DESYREL) 50 MG tablet Take 50 mg by mouth at bedtime.     [name] ZETIA 10 MG tablet TAKE ONE TABLET BY MOUTH ONCE DAILY. 30 tablet 2   No current facility-administered medications on file prior to visit.   Allergies  Allergen Reactions  . Cephalexin Hives  . Iron Nausea And Vomiting  . Milk-Related Compounds Other (See Comments)    Doesn't agree with stomach.   . Penicillins Hives  . Phenazopyridine Hcl Other (See Comments)    [place]     Family History  Problem Relation Age of Onset  . Heart  attack Mother     HTN  . Pneumonia Father   . Kidney failure Father   . Diabetes Father   . Pancreatic cancer Sister   . Diabetes Brother   . Hypertension Brother   . Diabetes Brother   . Colon cancer Neg Hx   . Anesthesia problems Neg Hx   . Hypotension Neg Hx   . Malignant hyperthermia Neg Hx   . Pseudochol deficiency Neg Hx   . Stroke Maternal Grandmother   . Heart attack Maternal Grandfather   . Cancer Sister     breast   . Alcohol abuse Maternal Uncle   . Hypertension Brother    PE: BP 122/60 mmHg  Pulse 68  Temp(Src) 98.5 F (36.9 C) (Oral)  Resp  12  Wt 153 lb 12.8 oz (69.763 kg)  SpO2 98% Body mass index is 29.08 kg/(m^2).  Wt Readings from Last 3 Encounters:  12/22/14 153 lb 12.8 oz (69.763 kg)  12/14/14 157 lb (71.215 kg)  11/10/14 156 lb 1.9 oz (70.816 kg)   Constitutional: overweight, in NAD but again appears somnolent Eyes: surgical pupils, EOMI, no exophthalmos ENT: moist mucous membranes, no thyromegaly, large thyroid nodule felt in isthmus, no cervical lymphadenopathy Cardiovascular: RRR, No MRG Respiratory: CTA B Gastrointestinal: abdomen soft, NT, ND, BS+ Musculoskeletal: no deformities, strength intact in all 4 Skin: moist, warm, no rashes Neurological: no tremor with outstretched hands, DTR normal in all 4  ASSESSMENT: 1. DM2, insulin-dependent, uncontrolled, without complications - gastroparesis - cerebrovasc. Ds -  H/o stroke - PN  2. Hypothyroidism  3. MNG - Thyroid U/S (10/11/2012):  Right thyroid lobe: 3.7 x 1.0 x 1.8 cm  Left thyroid lobe: 3.5 x 1.5 x 1.6 cm  Isthmus: 0.5 cm  Focal nodules: There is a 0.3 mm calcified nodule in the right midzone. There is a 0.6 x 0.7 x 0.5 cm hypoechoic nodule in the lateral aspect of the right midzone. There is a 0.7 x 0.6 x 0.5 cm hyperechoic nodule in the lateral aspect of the right lower pole. There is a 2.2 x 1.1 x 1.7 cm complex solid nodule in the inferior aspect of the isthmus  extending adjacent to the left lower pole. There is a 1.4 x 2.1 x 1.4 cm complex solid nodule in the left lower pole.  Lymphadenopathy: None visualized.  IMPRESSION: Multi nodular goiter which has markedly progressed since the prior exam. The dominant nodule at the inferior aspect of the isthmus fits criteria for fine needle aspiration biopsy if not previously Assessed.  - FNA (11/05/2012) x2: benign  - Thyroid U/S (01/20/2014) - felt one nodule enlarging >> new U/S: stable appearance of the nodules, except a small new nodule in isthmus: 1.2 cm   Right thyroid lobe Measurements: 4.4 cm x 1.2 cm x 1.7 cm. Multiple right-sided nodules identified. Each right-sided nodule demonstrates increased echogenicity, with the superior measuring 7 mm -8 mm, most inferior measuring 9 mm - 10 mm. A small, 3 mm focus of calcium with posterior shadowing is evident. There is also a mid nodule measuring 7 mm, which is echogenic.  Left thyroid lobe Measurements: 5.1 cm x 2.1 cm x 2.3 cm. Dominant lesion at the inferior pole of left thyroid is again evident, which has been previously biopsied (10/29/2012). This nodule measures 1.8 cm x 1.7cm x 2.5 cm. (Previous 1.4 cm x 2.1 cm x 1.4 cm)  Isthmus Thickness: 4 mm-5 mm. Isthmic nodule again noted, previously biopsied (10/29/2012). Currently this nodule measures 2.2 cm x 1.4 cm x 1.8 cm. (previous measurement 2.2 cm x 1.1 cm x 1.7 cm). There is a new nodule identified within the isthmus with heterogeneously hyperechoic characteristics. This nodule measures 12 mm x 5.4 mm x 7.2 mm.  Lymphadenopathy: None visualized.   PLAN:  1. Patient with long-standing, uncontrolled diabetes, on basal-bolus regimen, with great sugars now, except when she misses a dose of insulin. Few lows, no lower than 60s. Will continue same regimen.  Patient Instructions  Please continue: - Toujeo 55 units at bedtime - Novolog:  20 units before a regular meal  22 units before a  large meal   Please return in 3 month with your sugar log.   - continue checking sugars at different times of the day -  check 3-4 times a day, rotating checks - advised for yearly eye exams >> she is UTD - check A1c today >> 6.2% (improved!) - Return to clinic in 3 mo with sugar log   2. Hypothyroidism - latest TFTs normal recently - we discussed how to take the thyroid hormone every day, with water, >30 minutes before breakfast, separated by >4 hours from acid reflux medications, calcium, iron, multivitamins. She is taking the LT4 correctly. - continue LT4 50 mcg daily  3. MNG - previously had 2 normal Bx's (2014). Reviewed the ultrasounds from 2014 in 2015. - will need a new U/S in 2 years from previous (01/2016).

## 2014-12-26 ENCOUNTER — Emergency Department (HOSPITAL_COMMUNITY): Payer: Commercial Managed Care - HMO

## 2014-12-26 ENCOUNTER — Emergency Department (HOSPITAL_COMMUNITY)
Admission: EM | Admit: 2014-12-26 | Discharge: 2014-12-26 | Disposition: A | Discharge status: Home / Self Care | Payer: Commercial Managed Care - HMO | Attending: Emergency Medicine | Admitting: Emergency Medicine

## 2014-12-26 ENCOUNTER — Encounter (HOSPITAL_COMMUNITY): Payer: Self-pay | Admitting: *Deleted

## 2014-12-26 DIAGNOSIS — Y9289 Other specified places as the place of occurrence of the external cause: Secondary | ICD-10-CM | POA: Diagnosis not present

## 2014-12-26 DIAGNOSIS — G4730 Sleep apnea, unspecified: Secondary | ICD-10-CM | POA: Diagnosis not present

## 2014-12-26 DIAGNOSIS — G40909 Epilepsy, unspecified, not intractable, without status epilepticus: Secondary | ICD-10-CM | POA: Diagnosis not present

## 2014-12-26 DIAGNOSIS — Z7952 Long term (current) use of systemic steroids: Secondary | ICD-10-CM | POA: Insufficient documentation

## 2014-12-26 DIAGNOSIS — Z794 Long term (current) use of insulin: Secondary | ICD-10-CM | POA: Diagnosis not present

## 2014-12-26 DIAGNOSIS — Y998 Other external cause status: Secondary | ICD-10-CM | POA: Diagnosis not present

## 2014-12-26 DIAGNOSIS — E119 Type 2 diabetes mellitus without complications: Secondary | ICD-10-CM | POA: Diagnosis not present

## 2014-12-26 DIAGNOSIS — Y9389 Activity, other specified: Secondary | ICD-10-CM | POA: Insufficient documentation

## 2014-12-26 DIAGNOSIS — Z9981 Dependence on supplemental oxygen: Secondary | ICD-10-CM | POA: Insufficient documentation

## 2014-12-26 DIAGNOSIS — M810 Age-related osteoporosis without current pathological fracture: Secondary | ICD-10-CM | POA: Diagnosis not present

## 2014-12-26 DIAGNOSIS — Z880 Allergy status to penicillin: Secondary | ICD-10-CM | POA: Diagnosis not present

## 2014-12-26 DIAGNOSIS — E039 Hypothyroidism, unspecified: Secondary | ICD-10-CM | POA: Insufficient documentation

## 2014-12-26 DIAGNOSIS — F419 Anxiety disorder, unspecified: Secondary | ICD-10-CM | POA: Insufficient documentation

## 2014-12-26 DIAGNOSIS — F319 Bipolar disorder, unspecified: Secondary | ICD-10-CM | POA: Insufficient documentation

## 2014-12-26 DIAGNOSIS — I10 Essential (primary) hypertension: Secondary | ICD-10-CM | POA: Diagnosis not present

## 2014-12-26 DIAGNOSIS — W01190A Fall on same level from slipping, tripping and stumbling with subsequent striking against furniture, initial encounter: Secondary | ICD-10-CM | POA: Insufficient documentation

## 2014-12-26 DIAGNOSIS — M1990 Unspecified osteoarthritis, unspecified site: Secondary | ICD-10-CM | POA: Diagnosis not present

## 2014-12-26 DIAGNOSIS — Z79899 Other long term (current) drug therapy: Secondary | ICD-10-CM | POA: Diagnosis not present

## 2014-12-26 DIAGNOSIS — Z87738 Personal history of other specified (corrected) congenital malformations of digestive system: Secondary | ICD-10-CM | POA: Diagnosis not present

## 2014-12-26 DIAGNOSIS — Z9889 Other specified postprocedural states: Secondary | ICD-10-CM | POA: Insufficient documentation

## 2014-12-26 DIAGNOSIS — Z7951 Long term (current) use of inhaled steroids: Secondary | ICD-10-CM | POA: Diagnosis not present

## 2014-12-26 DIAGNOSIS — Z8673 Personal history of transient ischemic attack (TIA), and cerebral infarction without residual deficits: Secondary | ICD-10-CM | POA: Insufficient documentation

## 2014-12-26 DIAGNOSIS — S2232XA Fracture of one rib, left side, initial encounter for closed fracture: Secondary | ICD-10-CM | POA: Diagnosis not present

## 2014-12-26 DIAGNOSIS — K219 Gastro-esophageal reflux disease without esophagitis: Secondary | ICD-10-CM | POA: Insufficient documentation

## 2014-12-26 DIAGNOSIS — G43909 Migraine, unspecified, not intractable, without status migrainosus: Secondary | ICD-10-CM | POA: Diagnosis not present

## 2014-12-26 DIAGNOSIS — Z87891 Personal history of nicotine dependence: Secondary | ICD-10-CM | POA: Insufficient documentation

## 2014-12-26 DIAGNOSIS — Z7982 Long term (current) use of aspirin: Secondary | ICD-10-CM | POA: Diagnosis not present

## 2014-12-26 DIAGNOSIS — G8929 Other chronic pain: Secondary | ICD-10-CM | POA: Insufficient documentation

## 2014-12-26 DIAGNOSIS — S299XXA Unspecified injury of thorax, initial encounter: Secondary | ICD-10-CM | POA: Diagnosis present

## 2014-12-26 MED ORDER — HYDROCODONE-ACETAMINOPHEN 5-325 MG PO TABS: ORAL_TABLET | ORAL | Status: DC

## 2014-12-26 MED ORDER — HYDROCODONE-ACETAMINOPHEN 5-325 MG PO TABS
1.0000 | ORAL_TABLET | Freq: Once | ORAL | Status: AC
  Administered 2014-12-26: 1 via ORAL
  Filled 2014-12-26: qty 1

## 2014-12-26 NOTE — ED Notes (Addendum)
Pt states she was trying to answer the phone earlier this morning and her left side caught the side of the recliner and caused her to fall. Pt c/o pain to her left ribs. Denies loc

## 2014-12-26 NOTE — ED Provider Notes (Signed)
CSN: [id number]     Arrival date & time 12/26/14  2041 History   First MD Initiated Contact with Patient 12/26/14 2056     Chief Complaint  Patient presents with  . rib pain       HPI Pt was seen at 2110. Per pt, c/o sudden onset and persistence of constant left sided chest wall "pain" that began this morning. Pt states the pain began after she slipped and fell, hitting her left chest wall onto the back of a recliner. States she took "some old pain medicines I had from when I went to the dentist." Denies any other injuries. Denies neck or back pain, no abd pain, no N/V/D, no SOB/cough.    Past Medical History  Diagnosis Date  . Bipolar disorder ([place])   . CVA (cerebral infarction)   . Pancreatitis 2006    due to Depakote with normal EUS   . Osteoporosis   . Chronic back pain   . Trigger finger   . Anxiety disorder   . Hypertension   . Migraines     chronic headaches  . Diverticulosis     TCS 9/08 by Dr. Delfin Edis for diarrhea . Bx for micro scopic colitis negative.   . Schatzki's ring     non critical / EGD with ED 8/2011with RMR  . S/P colonoscopy 4098,1191, 2011    left-sided diverticula, hx of simple adenomas . 2011, random bx negative for microscopic colitis  . Glaucoma   . Allergic rhinitis   . Hypothyroidism     thyroid goiter  . Anemia   . Blood transfusion   . GERD (gastroesophageal reflux disease)   . Stroke [facility])     left sided weakness  . Seizures ([place])     unknown etiology-on meds-last seizure was 3 yerars ago  . Anxiety   . Depression   . Metabolic encephalopathy 4/78/2956  . Sleep apnea     on CPAP  . Arthritis   . Gum symptoms     infection on antibiotic  . Chronic neck pain   . Mononeuritis lower limb   . Frequent falls   . Diabetes mellitus     Type II  . Complication of anesthesia     "hard to put sleep"   Past Surgical History  Procedure Laterality Date  . Abdominal hysterectomy  1978  . Cholecystectomy  1984  . Ovarian cyst removal     . Carpal tunnel release Left 07/22/04    Dr. Aline Brochure  . Breast reduction surgery  1994  . Cataract extraction Bilateral   . Biopsy thyroid  2009  . Surgical excision of 3 tumors from right thigh and right buttock  and left upper thigh  2010  . Back surgery  July 2012  . Spine surgery  09/29/2010    Dr. Rolena Infante  . Maloney dilation  12/29/2010    RMR;  . Esophagogastroduodenoscopy  12/29/2010    Rourk-Retained food in the esophagus and stomach, small hiatal hernia, status post Maloney dilation of the esophagus  . Craniotomy  11/23/2011    Procedure: CRANIOTOMY TUMOR EXCISION;  Surgeon: Hosie Spangle, MD;  Location: [place] [facility];  Service: Neurosurgery;  Laterality: N/A;  Craniotomy for tumor resection  . Brain surgery  11/2011    resection of meningioma  . Colonoscopy N/A 09/25/2012    OZH:YQMVHQI diverticulosis.  colonic polyp-removed : tubular adenoma  . Esophagogastroduodenoscopy N/A 09/25/2012    ONG:[id number] atonic baggy esophagus status post [facility] dilation  49 F. Hiatal hernia  . Givens capsule study N/A 01/15/2013    NORMAL.   . Bacterial overgrowth test N/A 05/05/2013    Procedure: BACTERIAL OVERGROWTH TEST;  Surgeon: Daneil Dolin, MD;  Location: [facility];  Service: Endoscopy;  Laterality: N/A;  7:30  . Transthoracic echocardiogram  2010    EF 60-65%, mild conc LVH, grade 1 diastolic dysfunction; mildly calcified MV annulus with mildly thickened leaflets, mildly calcified MR annulus  . Nm myocar perf wall motion  2006    "relavtiely normal" persantine, mild anterior thinning (breast attenuation artifact), no region of scar/ischemia  . Cardiac catheterization  05/10/2005    normal coronaries, normal LV systolic function and EF (Dr. Jackie Plum)  . Tooth extraction Bilateral 12/14/2014    Procedure: REMOVAL OF BILATERAL MANDIBULAR EXOSTOSES;  Surgeon: Diona Browner, DDS;  Location: [place];  Service: Oral Surgery;  Laterality: Bilateral;   Family History  Problem Relation Age  of Onset  . Heart attack Mother     HTN  . Pneumonia Father   . Kidney failure Father   . Diabetes Father   . Pancreatic cancer Sister   . Diabetes Brother   . Hypertension Brother   . Diabetes Brother   . Colon cancer Neg Hx   . Anesthesia problems Neg Hx   . Hypotension Neg Hx   . Malignant hyperthermia Neg Hx   . Pseudochol deficiency Neg Hx   . Stroke Maternal Grandmother   . Heart attack Maternal Grandfather   . Cancer Sister     breast   . Alcohol abuse Maternal Uncle   . Hypertension Brother    Social History  Substance Use Topics  . Smoking status: Former Smoker -- 0.25 packs/day for 7 years    Types: Cigarettes    Quit date: 05/27/2014  . Smokeless tobacco: Never Used     Comment: quit for 5 weeks now   . Alcohol Use: No     Comment:     OB History    Gravida Para Term Preterm AB TAB SAB Ectopic Multiple Living   _0 Review of Systems ROS: Statement: All systems negative except as marked or noted in the HPI; Constitutional: Negative for fever and chills. ; ; Eyes: Negative for eye pain, redness and discharge. ; ; ENMT: Negative for ear pain, hoarseness, nasal congestion, sinus pressure and sore throat. ; ; Cardiovascular: Negative for palpitations, diaphoresis, dyspnea and peripheral edema. ; ; Respiratory: Negative for cough, wheezing and stridor. ; ; Gastrointestinal: Negative for nausea, vomiting, diarrhea, abdominal pain, blood in stool, hematemesis, jaundice and rectal bleeding. . ; ; Genitourinary: Negative for dysuria, flank pain and hematuria. ; ; Musculoskeletal: +chest wall pain. Negative for back pain and neck pain. Negative for swelling and deformity.; ; Skin: Negative for pruritus, rash, abrasions, blisters, bruising and skin lesion.; ; Neuro: Negative for headache, lightheadedness and neck stiffness. Negative for weakness, altered level of consciousness , altered mental status, extremity weakness, paresthesias, involuntary movement, seizure  and syncope.      Allergies  Cephalexin; Iron; Milk-related compounds; Penicillins; and Phenazopyridine hcl  Home Medications   Prior to Admission medications   Medication Sig Start Date End Date Taking? Authorizing Provider  aspirin EC 81 MG tablet Take 81 mg by mouth daily.   Yes Historical Provider, MD  Cholecalciferol (D 5000) 5000 UNITS capsule Take 5,000 Units by mouth daily.   Yes Historical  Provider, MD  insulin aspart (NOVOLOG FLEXPEN) 100 UNIT/ML FlexPen Inject 20-22 Units into the skin 3 (three) times daily with meals. 09/21/14  Yes Philemon Kingdom, MD  Insulin Glargine (TOUJEO SOLOSTAR) 300 UNIT/ML SOPN Inject 55 Units into the skin at bedtime. Patient taking differently: Inject 50 Units into the skin at bedtime.  09/21/14  Yes Philemon Kingdom, MD  lubiprostone (AMITIZA) 8 MCG capsule Take 1 capsule (8 mcg total) by mouth daily after breakfast. 11/05/14  Yes Mahala Menghini, PA-C  Multiple Vitamins-Minerals (ONE-A-DAY 50 PLUS PO) Take 1 tablet by mouth daily.   Yes Historical Provider, MD  vitamin E 400 UNIT capsule Take 400 Units by mouth daily.   Yes Historical Provider, MD  ACCU-CHEK FASTCLIX LANCETS MISC Use to test blood sugar 4 times daily. Dx: E10.65 12/03/14   Philemon Kingdom, MD  Alcohol Swabs (B-D SINGLE USE SWABS REGULAR) PADS Use for injections and glucose testing 8 times daily. Dx: E10.65 12/03/14   Philemon Kingdom, MD  amLODipine (NORVASC) 2.5 MG tablet Take 1 tablet (2.5 mg total) by mouth daily. 07/27/14   [name], MD  Blood Glucose Calibration (ACCU-CHEK SMARTVIEW CONTROL) LIQD 1 each by Other route as needed. 12/03/14   Philemon Kingdom, MD  Blood Glucose Monitoring Suppl (ACCU-CHEK NANO SMARTVIEW) W/DEVICE KIT 1 each by Does not apply route daily. Dx: E10.65 12/03/14   Philemon Kingdom, MD  clobetasol cream (TEMOVATE) 6.01 % Apply 1 application topically daily.    Historical Provider, MD  cloNIDine (CATAPRES) 0.3 MG tablet TAKE 1 TABLET BY MOUTH EVERY  EIGHT HOURS. ONCE AT 8AM, [place], AND 12 MIDNIGHT. 09/18/14   [name], MD  diclofenac sodium (VOLTAREN) 1 % GEL Apply 2 g topically daily as needed (Pain). 11/20/14   [name], MD  dicyclomine (BENTYL) 10 MG capsule Take 1 capsule by mouth 3 (three) times daily before meals.  08/21/14   Historical Provider, MD  docusate sodium (COLACE) 100 MG capsule Take 2 capsules (200 mg total) by mouth at bedtime. 06/07/14   Kathie Dike, MD  fluticasone (FLONASE) 50 MCG/ACT nasal spray Place 2 sprays into both nostrils daily.    Historical Provider, MD  glucose blood (ACCU-CHEK SMARTVIEW) test strip Use to test blood sugar 4 times daily as instructed. Dx: E10.65 12/03/14   Philemon Kingdom, MD  hydroxychloroquine (PLAQUENIL) 200 MG tablet Take 200 mg by mouth daily.    Historical Provider, MD  Insulin Pen Needle (CLICKFINE PEN NEEDLES) 32G X 4 MM MISC Use 4x a day 09/21/14   Philemon Kingdom, MD  lamoTRIgine (LAMICTAL) 100 MG tablet Take 150 mg by mouth daily.    Historical Provider, MD  levothyroxine (SYNTHROID, LEVOTHROID) 50 MCG tablet Take 1 tablet (50 mcg total) by mouth daily. And 1/2 tablet on Sunday. 12/22/14   Philemon Kingdom, MD  LORazepam (ATIVAN) 0.5 MG tablet Take 1 tablet (0.5 mg total) by mouth 3 (three) times daily. 11/09/14   Cloria Spring, MD  losartan (COZAAR) 100 MG tablet TAKE 1 TABLET BY MOUTH ONCE DAILY. 09/18/14   [name], MD  methocarbamol (ROBAXIN) 500 MG tablet Take 500 mg by mouth daily. 12/21/14   Historical Provider, MD  metoprolol (LOPRESSOR) 50 MG tablet TAKE 1 TABLET BY MOUTH TWICE DAILY. 09/18/14   Pixie Casino, MD  montelukast (SINGULAIR) 10 MG tablet TAKE ONE TABLET BY MOUTH ONCE DAILY. 08/24/14   [name], MD  Nystatin [facility]) 100000 UNIT/GM POWD APPLY TO AFFECTED AREA 4 TIMES DAILY.  Patient taking differently: Apply topically 4 (four) times daily. APPLY TO AFFECTED AREA 4 TIMES DAILY. 10/20/14   [name], MD  oxyCODONE-acetaminophen  (PERCOCET) 5-325 MG tablet Take 1-2 tablets by mouth every 4 (four) hours as needed for severe pain. 12/14/14   Diona Browner, DDS  pregabalin (LYRICA) 75 MG capsule Take 75 mg by mouth 2 (two) times daily.  10/26/11   Carole Civil, MD  promethazine (PHENERGAN) 12.5 MG tablet Take 1 tablet (12.5 mg total) by mouth every 6 (six) hours as needed for nausea or vomiting. 08/20/14   Mahala Menghini, PA-C  RABEprazole (ACIPHEX) 20 MG tablet TAKE 1 TABLET BY MOUTH TWICE DAILY. 07/28/14   Carlis Stable, NP  RESTASIS 0.05 % ophthalmic emulsion Place 1 drop into both eyes 2 (two) times daily.  02/04/14   Historical Provider, MD  risperiDONE (RISPERDAL) 0.5 MG tablet Take 1 tablet (0.5 mg total) by mouth at bedtime. 11/09/14   Cloria Spring, MD  rosuvastatin (CRESTOR) 5 MG tablet TAKE 1 TABLET BY MOUTH AT BEDTIME. 12/22/14   [name], MD  sertraline (ZOLOFT) 100 MG tablet Take 2 tablets (200 mg total) by mouth daily. 11/09/14   Cloria Spring, MD  spironolactone (ALDACTONE) 25 MG tablet TAKE ONE TABLET BY MOUTH DAILY. 12/22/14   [name], MD  traZODone (DESYREL) 150 MG tablet Take 2 tablets (300 mg total) by mouth at bedtime. 11/09/14   Cloria Spring, MD  ZETIA 10 MG tablet TAKE ONE TABLET BY MOUTH ONCE DAILY. 12/22/14   [name], MD   BP 186/67 mmHg  Pulse 72  Temp(Src) 98.8 F (37.1 C) (Oral)  Resp 13  Ht 5' (1.524 m)  Wt 153 lb (69.4 kg)  BMI 29.88 kg/m2  SpO2 100% Physical Exam  2115: Physical examination:  Nursing notes reviewed; Vital signs and O2 SAT reviewed;  Constitutional: Well developed, Well nourished, Well hydrated, In no acute distress; Head:  Normocephalic, atraumatic; Eyes: EOMI, PERRL, No scleral icterus; ENMT: Mouth and pharynx normal, Mucous membranes moist; Neck: Supple, Full range of motion, No lymphadenopathy; Cardiovascular: Regular rate and rhythm, No gallop; Respiratory: Breath sounds clear & equal bilaterally, No wheezes.  Speaking full sentences with  ease, Normal respiratory effort/excursion; Chest: +left posterior-lateral chest wall tender to palp. No abrasions or ecchymosis. No rash, no soft tissue crepitus, no deformity. Movement normal; Abdomen: Soft, Nontender, Nondistended, Normal bowel sounds; Genitourinary: No CVA tenderness; Spine:  No midline CS, TS, LS tenderness.;; Extremities: Pulses normal, No tenderness, No edema, No calf edema or asymmetry.; Neuro: AA&Ox3, Major CN grossly intact.  Speech clear. No gross focal motor or sensory deficits in extremities.; Skin: Color normal, Warm, Dry.   ED Course  Procedures (including critical care time) Labs Review   Imaging Review  I have personally reviewed and evaluated these images and lab results as part of my medical decision-making.   EKG Interpretation None      MDM  MDM Reviewed: previous chart, nursing note and vitals Interpretation: x-ray   Dg Ribs Unilateral W/chest Left 12/26/2014  CLINICAL DATA:  Initial valuation for acute trauma, fall, left-sided rib pain. EXAM: LEFT RIBS AND CHEST - 3+ VIEW COMPARISON:  Prior radiograph from 06/02/2014. FINDINGS: Cardiac and mediastinal silhouettes are within normal limits. Lungs are mildly hypoinflated. Scatter linear opacity at the left lung base most consistent with subsegmental atelectasis. No focal infiltrate. No pulmonary edema or pleural effusion. No pneumothorax. Dedicated views of the left ribs demonstrate  an acute minimally displaced fracture of the left posterolateral seventh rib. No other definite acute displaced rib fractures. IMPRESSION: 1. Acute minimally displaced fracture of the left posterolateral seventh rib. 2. Mild left basilar atelectasis. No other active cardiopulmonary disease. Electronically Signed   By: Jeannine Boga M.D.   On: 12/26/2014 22:37    2255:  VSS, resps easy, abd remains benign. Tx symptomatically at this time (IS and pain meds). Dx and testing d/w pt and family.  Questions answered.  Verb  understanding, agreeable to d/c home with outpt f/u.   Francine Graven, DO 12/30/14 2103

## 2014-12-26 NOTE — Discharge Instructions (Signed)
°Emergency Department Resource Guide °1) Find a Doctor and Pay Out of Pocket °Although you won't have to find out who is covered by your insurance plan, it is a good idea to ask around and get recommendations. You will then need to call the office and see if the doctor you have chosen will accept you as a new patient and what types of options they offer for patients who are self-pay. Some doctors offer discounts or will set up payment plans for their patients who do not have insurance, but you will need to ask so you aren't surprised when you get to your appointment. ° °2) Contact Your Local Health Department °Not all health departments have doctors that can see patients for sick visits, but many do, so it is worth a call to see if yours does. If you don't know where your local health department is, you can check in your phone book. The CDC also has a tool to help you locate your state's health department, and many state websites also have listings of all of their local health departments. ° °3) Find a Walk-in Clinic °If your illness is not likely to be very severe or complicated, you may want to try a walk in clinic. These are popping up all over the country in pharmacies, drugstores, and shopping centers. They're usually staffed by nurse practitioners or physician assistants that have been trained to treat common illnesses and complaints. They're usually fairly quick and inexpensive. However, if you have serious medical issues or chronic medical problems, these are probably not your best option. ° °No Primary Care Doctor: °- Call [facility] at  832-8000 - they can help you locate a primary care doctor that  accepts your insurance, provides certain services, etc. °- Physician Referral Service- 1-800-533-3463 ° °Chronic Pain Problems: °Organization         Address  Phone   Notes  °[facility]  (336) 297-2271 Patients need to be referred by their primary care doctor.  ° °Medication  Assistance: °Organization         Address  Phone   Notes  °[place] [facility] [address] °[place], [place] [address] (336) 641-8030 --Must be a resident of [place] °-- Must have NO insurance coverage whatsoever (no Medicaid/ Medicare, etc.) °-- The pt. MUST have a primary care doctor that directs their care regularly and follows them in the community °  °[facility]  (866) 331-1348   °[employer]  (888) 892-1162   ° °Agencies that provide inexpensive medical care: °Organization         Address  Phone   Notes  °[facility]  (336) 832-8035   °[facility]    (336) 832-7272   °[facility] [address] °[place], [place] [address] (336) 832-4777   °[facility] [address], °[place] (336) 271-4999   °[facility]    (336) 373-0678   °[facility]    (336) 272-1050   °[facility] ° [address],  Phone:  (336) 832-4444, Fax:  (336) 832-4440 Hours of Operation:  9 am - 6 pm, M-F.  Also accepts Medicaid/Medicare and self-pay.  °[facility] ° [address],  Phone: (336) 832-3150, Fax: (336) [phone]. Hours of Operation:  8:30 am - 5:30 pm, M-F.  Also accepts Medicaid and self-pay.  °[facility] [place] [address], [place] Phone: (336) 878-6027   °[facility] [address], [place], [place] (336)723-1848, Ext. 123 Mondays & Thursdays: 7-9 AM.  First 15 patients are seen on a first come, first serve basis. °  ° °Medicaid-accepting [place] Providers: ° °Organization         Address  Phone   Notes  °[facility] [address], [place] (336) 641-2100 Also accepts self-pay patients.  °[facility] [address], [place] ° (336) 856-9996   °[facility] [address], [place]  (336) 288-8857   °[facility] [address] [place] [address], [place] (336) 299-7000   °Veita Bland [address], [place]  ° (336) 373-1557 Only accepts [place] Access Medicaid patients after they have their name applied to their card.  ° °Self-Pay (no insurance) in [place]: ° °Organization         Address  Phone   Notes  °Sickle Cell Patients, [facility] [address], [place] (336) 832-1970   °[facility] [address], [place] (336) 832-4400   °[facility] ° [address], [place] (336) 992-4800   °[facility]/Dr. Osei-Bonsu ° [address] [place] [address], [place] or [address], [place] (336) [phone] Phone number for both [place] and [place] locations is the same.  °[facility] [address], [place] (336) 299-0000   °[facility] [address] [place] [address], [place] or [address] (336) 852-7530 °(336) 878-2260   °[facility] [address], [place] (336) 350-1642, phone; (336) 294-5005, fax Sees patients 1st and 3rd Saturday of every month.  Must not qualify for public or private insurance (i.e. Medicaid, Medicare, [place] Health Choice, Veterans' Benefits) • Household income should be no more than 200% of the poverty level •The clinic cannot treat you if you are pregnant or think you are pregnant • Sexually transmitted diseases are not treated at the clinic.  ° ° °Dental Care: °Organization         Address  Phone  Notes  °[place] [facility] [address], [place] (336) 641-6152 Accepts children up to age 21 who are enrolled in Medicaid or [place] Health Choice; pregnant women with a Medicaid card; and children who have applied for Medicaid or [place] Health Choice, but were declined, whose parents can pay a reduced fee at time of service.  °[place]  [facility] [place]  [address], [place] (336) 641-7733 Accepts children up to age 21 who are enrolled in Medicaid or [place] Health Choice; pregnant women with a Medicaid card; and children who have applied for Medicaid or [place] Health Choice, but were declined, whose parents can pay a reduced fee at time of service.  °[facility] ° [address],  (336) 641-4533 Patients are seen by appointment only. Walk-ins are not accepted. [facility] will see patients 18 years of age and older. °Monday - Tuesday (8am-5pm) °Most Wednesdays (8:30-5pm) °$30 per visit, cash only  °[facility] ° [address], [place] (336) 641-4533 Patients are seen by appointment only. Walk-ins are not accepted. [facility] will see patients 18 years of age and older. °One   Wednesday Evening (Monthly: Volunteer Based).  $30 per visit, cash only  °[facility]  (919) 537-3737 for adults; Children under age 4, call Graduate Pediatric Dentistry at ([phone]) [phone]. Children aged 4-14, please call ([phone]) [phone] to request a pediatric application. ° Dental services are provided in all areas of dental care including fillings, crowns and bridges, complete and partial dentures, implants, gum treatment, root canals, and extractions. Preventive care is also provided. Treatment is provided to both adults and children. °Patients are selected via a lottery and there is often a waiting list. °  °[facility] [address], °[place] ° (336) 763-8833 [email or web address] °  °[facility] [address], [place], [place] (336)723-1848, Ext. 123 Second and Fourth Thursday of each month, opens at 6:30 AM; Clinic ends at 9 AM.  Patients are seen on a first-come first-served basis, and a limited number are seen during each clinic.  ° °[facility] ° [address], [place], [place] (336) 723-7904    Eligibility Requirements °You must have lived in [place], [place], or [place] counties for at least the last three months. °  You cannot be eligible for state or federal sponsored healthcare insurance, including Veterans Administration, Medicaid, or Medicare. °  You generally cannot be eligible for healthcare insurance through your employer.  °  How to apply: °Eligibility screenings are held every Tuesday and Wednesday afternoon from 1:00 pm until 4:00 pm. You do not need an appointment for the interview!  °[facility] [address], [place], [place] 336-631-2330   °[facility]  336-342-8273   °[facility]  336-703-3100   °[facility]  336-570-6415   ° °Behavioral Health Resources in the Community: °Intensive Outpatient Programs °Organization         Address  Phone  Notes  °[facility] [address], [place], [place] 336-878-6098   °[facility] [address], [place], [place] 336-832-9800   °[facility] [address], [place], [place] ° 336-882-2125   °[facility] [address],  °[place], [place] 1-800-853-5163 or 336-641-4981   °Substance Abuse Resources °Organization         Address  Phone  Notes  °[facility]  336-882-2125   °[facility]  336-784-9470   °[facility]  336-285-9073   °[facility]  336-845-3988   °Residential & Outpatient Substance Abuse Program  1-800-659-3381   °Psychological Services °Organization         Address  Phone  Notes  °[facility]  336- 832-9600   °[facility]  336- 378-7881   °[facility] [address], [place] 1-800-853-5163 or 336-641-4981   ° °Mobile Crisis Teams °Organization         Address  Phone  Notes  °[facility], Mobile Crisis Care Unit  1-877-626-1772   °Assertive °[facility] ° [address]  [place], [place] 336-834-9664   °Sharon DeEsch [address] °[place] 336-554-5454   ° °Self-Help/Support Groups °Organization         Address  Phone             Notes  °[facility] - variety of support groups  336- 373-1402 Call for more information  °Narcotics Anonymous (NA), [facility] [address], °[place]  2 meetings at this location  ° °  Residential Treatment Programs Organization         Address  Phone  Notes  [facility] [address],    [place]  1-336-455-3620   [facility]  [address] [place] [address], [place], [place]   [place], [place] (437)808-7717 Admissions: 8am-3pm M-F  [facility] [address],    [place], [place] 102-725-3664   [facility] [address] [place], [place], [place]   [facility] [address]  [place], [place]   [facility] Intensive Outpatient [address], [place], [place]   [facility] [address]  [place], [place] 1-(775) 302-1178 or 413-556-1316   [facility] [address], [place], [place] Accepts Medicaid  [facility] [address],  [place] 1-931 136 7816 Substance Abuse/Addiction Treatment   [facility]         Address  Phone  Notes  [facility]  308-080-2892   Domenic Schwab, PhD [address] [place], [place]   308-522-9699 or 587-559-5203   [facility] [place], [place] 873-755-4220   [facility] [address], [place], [place] (413)[phone] Insurance/Medicaid/sponsorship through [facility] [address] [place]                                    [place], [place] [phone] [place] [address] [place] [address], [place] [phone]    Dr. Adele Schilder  [phone]   [facility] Dept. 1) [address] [place] 2) [place] 3)  [place] [address], [place] [phone]  [phone]   [place] [phone] or [phone] (After Hours)      Take the prescription as directed.  Apply moist heat or ice to the area(s) of discomfort, for 15 minutes at a time, several times per day for the next few days.  Do not fall asleep on a heating or ice pack.  Call your regular medical doctor on Monday to schedule a follow up appointment in the next 2 days.  Return to the Emergency Department immediately if worsening.

## 2014-12-30 ENCOUNTER — Encounter: Payer: Self-pay | Admitting: Family Medicine

## 2014-12-30 ENCOUNTER — Ambulatory Visit (INDEPENDENT_AMBULATORY_CARE_PROVIDER_SITE_OTHER): Payer: Commercial Managed Care - HMO | Admitting: Family Medicine

## 2014-12-30 VITALS — BP 160/68 | HR 80 | Resp 18 | Ht 61.0 in | Wt 151.0 lb

## 2014-12-30 DIAGNOSIS — R0781 Pleurodynia: Secondary | ICD-10-CM | POA: Diagnosis not present

## 2014-12-30 DIAGNOSIS — E038 Other specified hypothyroidism: Secondary | ICD-10-CM | POA: Diagnosis not present

## 2014-12-30 DIAGNOSIS — E785 Hyperlipidemia, unspecified: Secondary | ICD-10-CM | POA: Diagnosis not present

## 2014-12-30 DIAGNOSIS — G40909 Epilepsy, unspecified, not intractable, without status epilepticus: Secondary | ICD-10-CM

## 2014-12-30 DIAGNOSIS — F17210 Nicotine dependence, cigarettes, uncomplicated: Secondary | ICD-10-CM

## 2014-12-30 DIAGNOSIS — Z13820 Encounter for screening for osteoporosis: Secondary | ICD-10-CM

## 2014-12-30 DIAGNOSIS — Z1159 Encounter for screening for other viral diseases: Secondary | ICD-10-CM | POA: Diagnosis not present

## 2014-12-30 DIAGNOSIS — I10 Essential (primary) hypertension: Secondary | ICD-10-CM | POA: Diagnosis not present

## 2014-12-30 DIAGNOSIS — E559 Vitamin D deficiency, unspecified: Secondary | ICD-10-CM | POA: Diagnosis not present

## 2014-12-30 DIAGNOSIS — Z720 Tobacco use: Secondary | ICD-10-CM

## 2014-12-30 DIAGNOSIS — R296 Repeated falls: Secondary | ICD-10-CM

## 2014-12-30 DIAGNOSIS — Z114 Encounter for screening for human immunodeficiency virus [HIV]: Secondary | ICD-10-CM

## 2014-12-30 IMAGING — CT CT HEAD W/O CM
1 of 2 series · 16 of 30 positions shown, 20 images · non-contrast
Comparison: CT scan of November 25, 2012.

CLINICAL DATA: Weakness.

EXAM:
CT HEAD WITHOUT CONTRAST
TECHNIQUE: Contiguous axial images were obtained from the base of the skull
through the vertex without intravenous contrast.

[Series 2: headtrauma 4.8 h37s · axial · 0.46mm/px · z∈[+107,+237]mm · 16 of 30 slices shown, 20 images]
[im 2/30  brain]
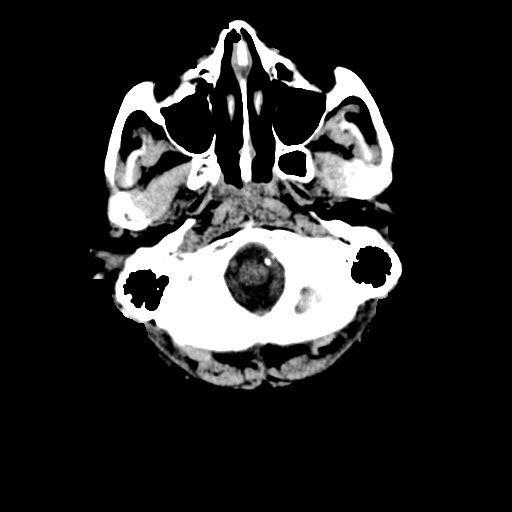
[im 2/30  bone]
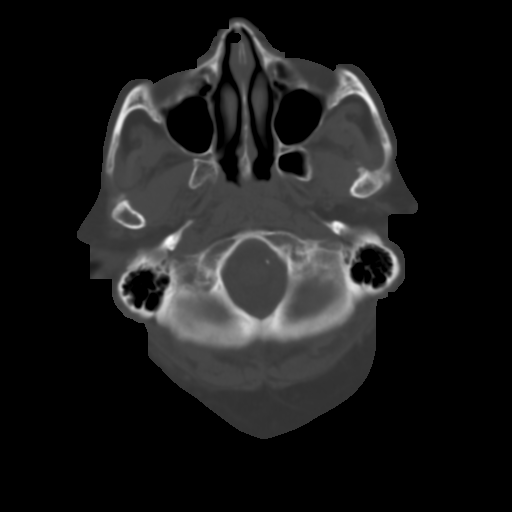
[im 4/30  brain]
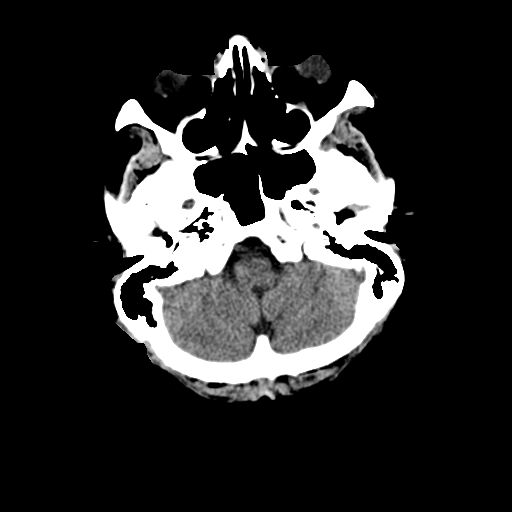
[im 5/30  brain]
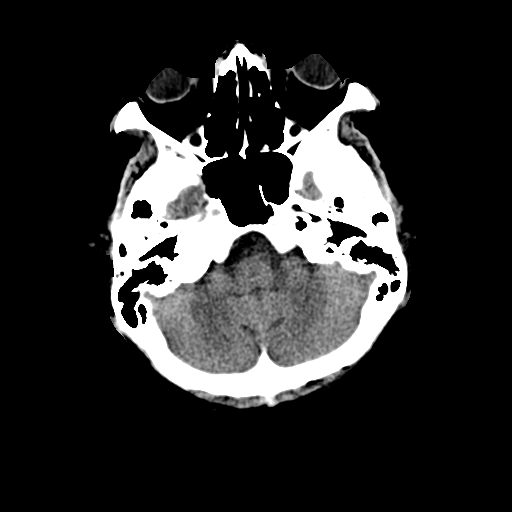
[im 8/30  brain]
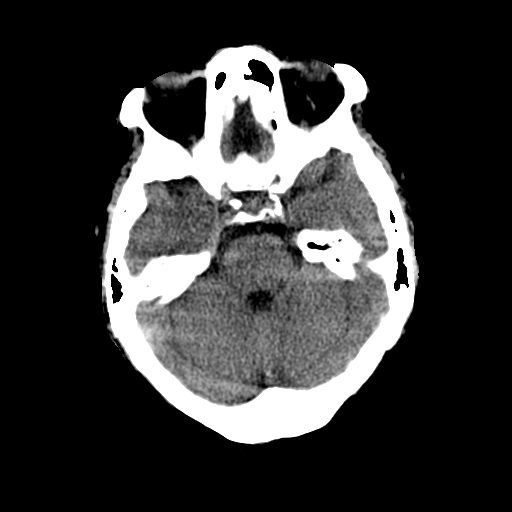
[im 9/30  brain]
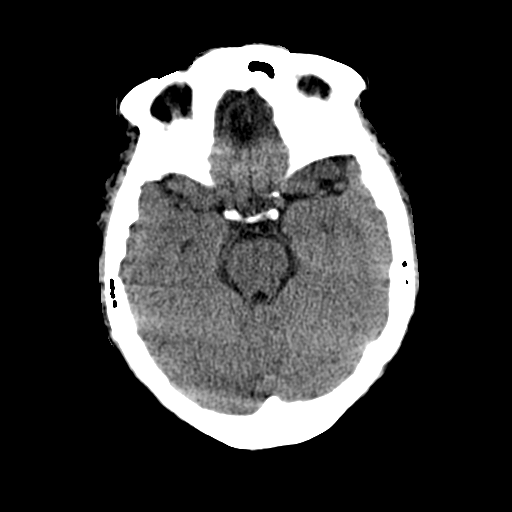
[im 9/30  bone]
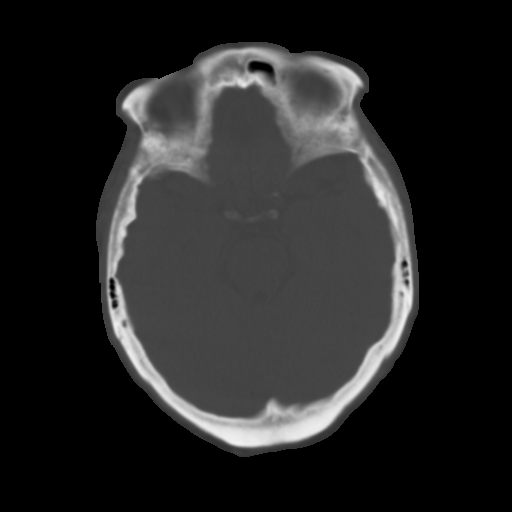
[im 10/30  brain]
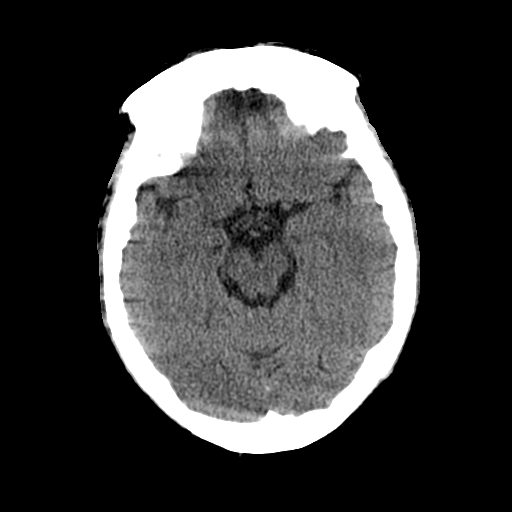
[im 13/30  brain]
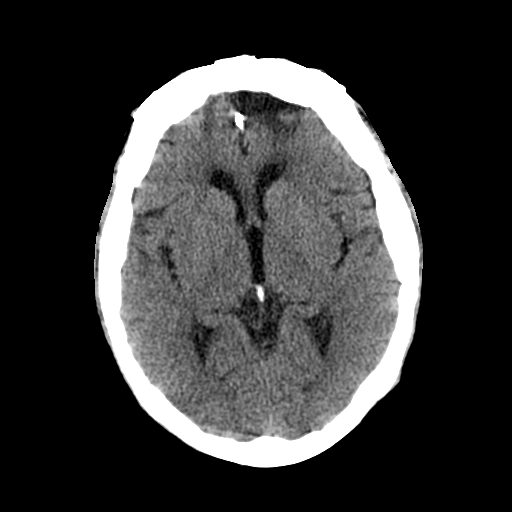
[im 14/30  brain]
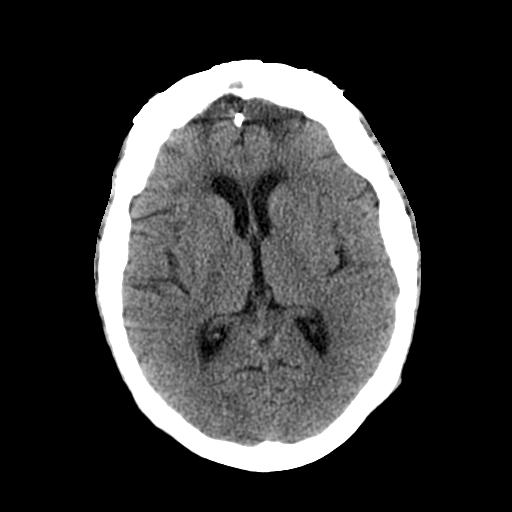
[im 16/30  brain]
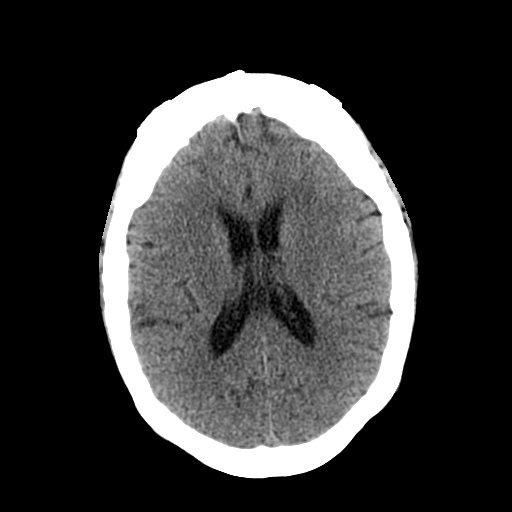
[im 16/30  bone]
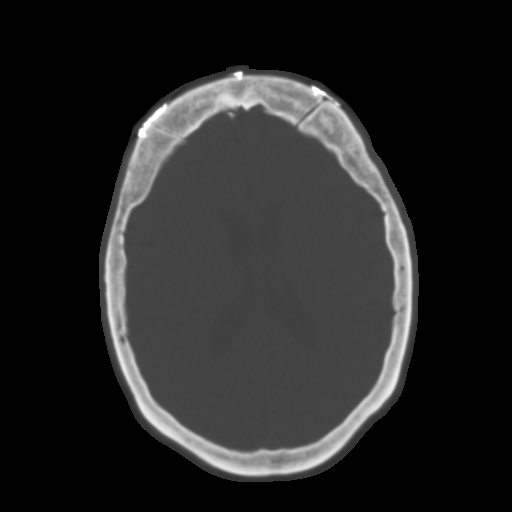
[im 17/30  brain]
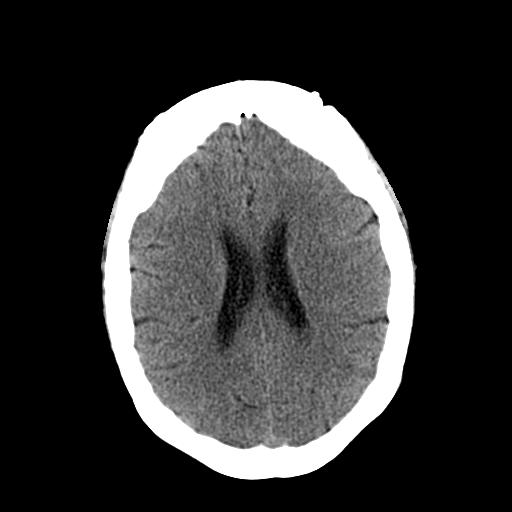
[im 20/30  brain]
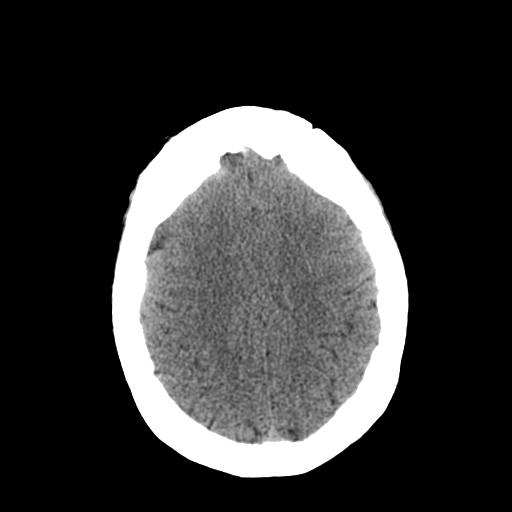
[im 21/30  brain]
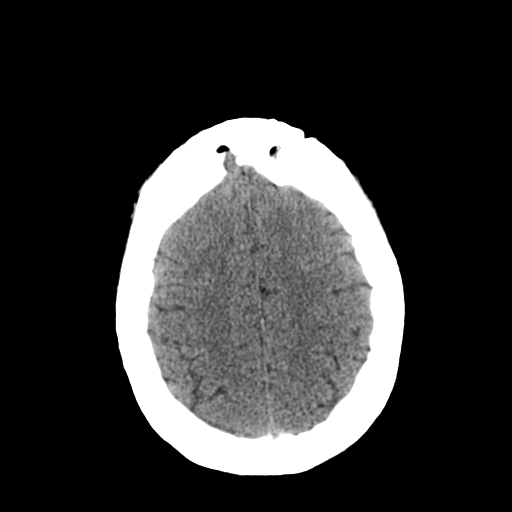
[im 22/30  brain]
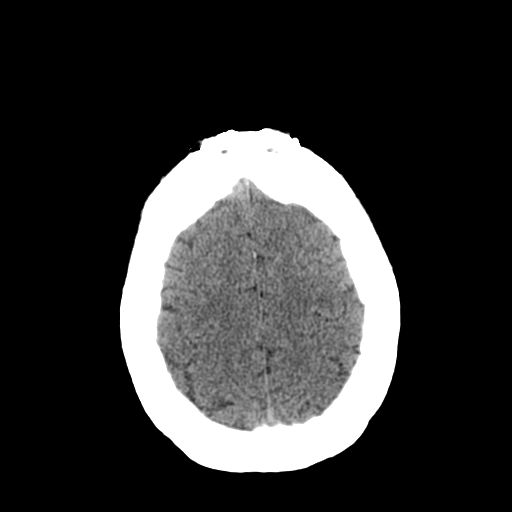
[im 22/30  bone]
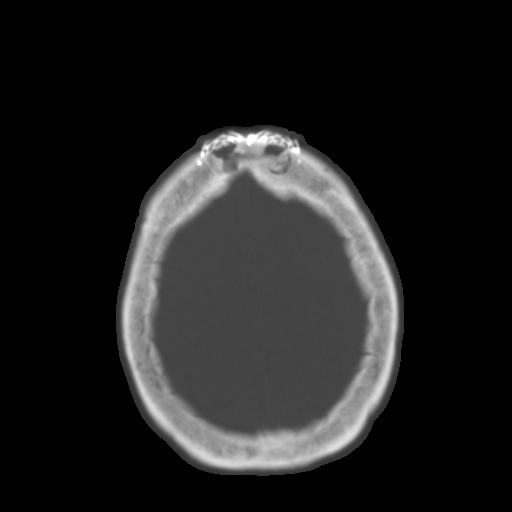
[im 25/30  brain]
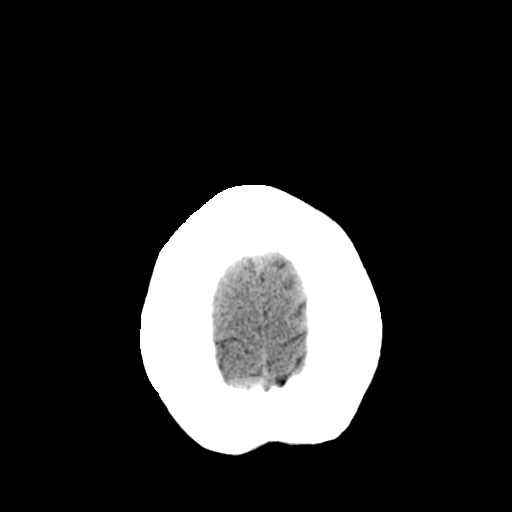
[im 26/30  brain]
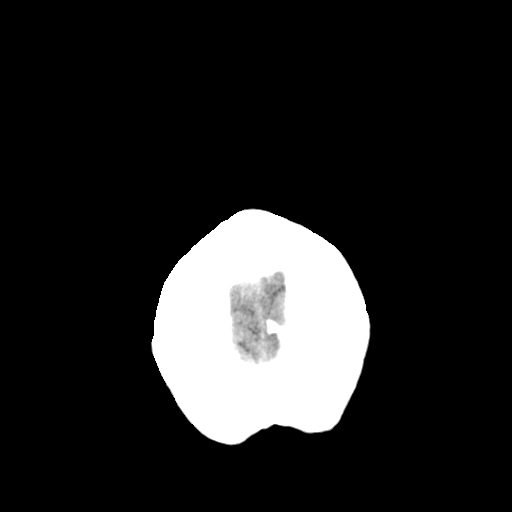
[im 28/30  brain]
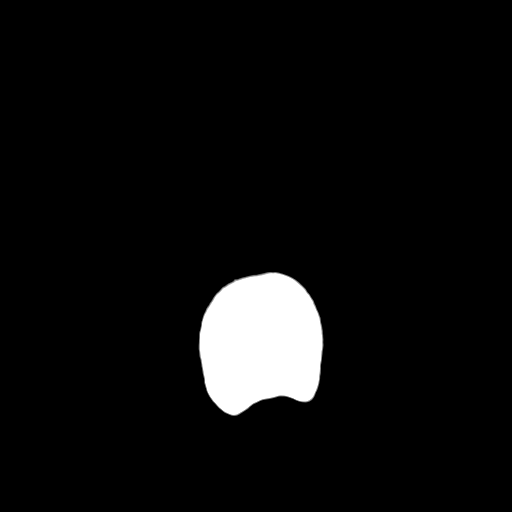

[16 of 30 positions shown; findings below may reference images not displayed]

FINDINGS: Status post frontal craniotomy. No acute abnormality seen in the
bony calvarium. Stable left frontal encephalomalacia consistent with
prior surgery. No mass effect or midline shift is noted. Ventricular
size is within normal limits. There is no evidence of mass lesion,
hemorrhage or acute infarction.
IMPRESSION: No acute intracranial abnormality seen.

## 2014-12-30 IMAGING — CR DG CHEST 1V PORT
1 series · 1 of 1 positions shown · non-contrast
Comparison: None.

CLINICAL DATA: Weakness

EXAM:
PORTABLE CHEST - 1 VIEW

[portable]
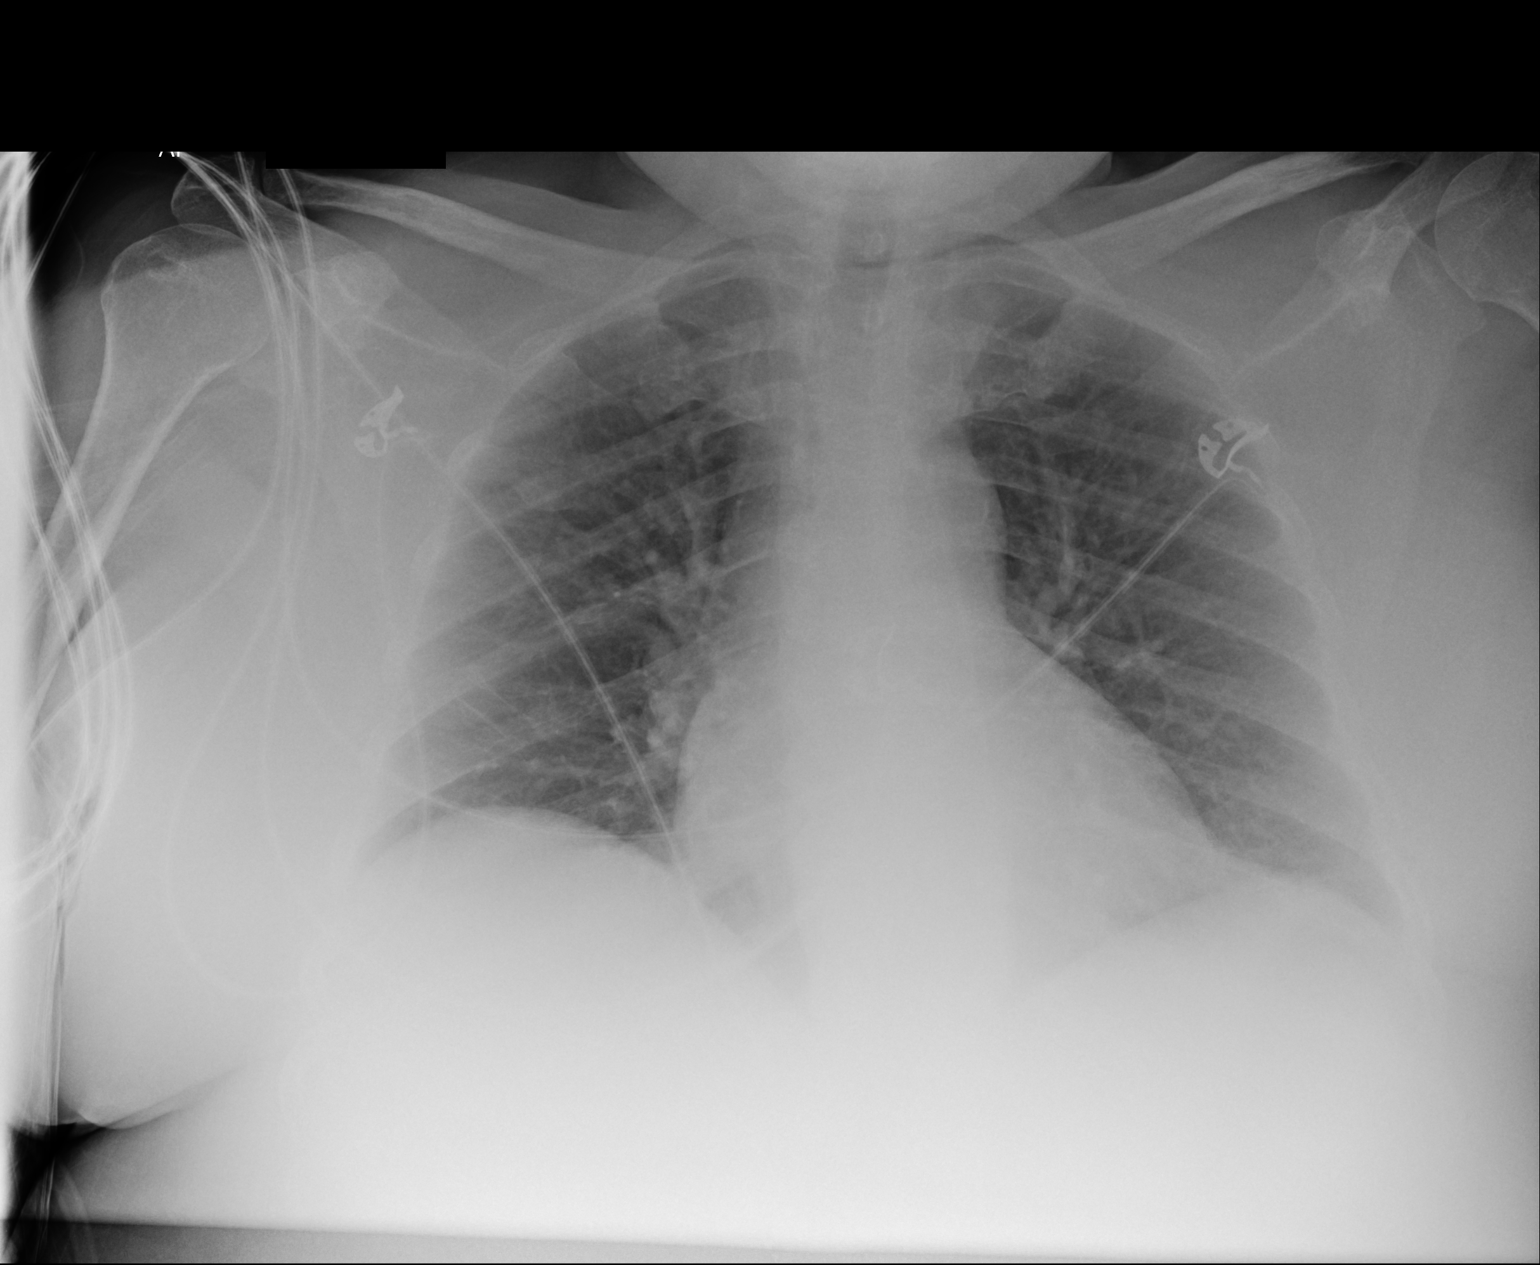

[1 of 1 positions shown; findings below may reference images not displayed]

FINDINGS: Low lung volumes. The heart size and mediastinal contours are within
normal limits. Both lungs are clear. The visualized skeletal
structures are unremarkable.
IMPRESSION: No active disease.

## 2014-12-30 MED ORDER — HYDROCODONE-ACETAMINOPHEN 5-325 MG PO TABS: 1.0000 | ORAL_TABLET | Freq: Two times a day (BID) | ORAL | Status: DC

## 2014-12-30 MED ORDER — AMLODIPINE BESYLATE 5 MG PO TABS: 5.0000 mg | ORAL_TABLET | Freq: Every day | ORAL | Status: DC

## 2014-12-30 NOTE — Patient Instructions (Addendum)
f/u as before  Increase in dose of amlodipine to 5 mg daily, bP too high  1 week additional script for hydrocodone twice daily for left rib pain  You are referred for dexa  Labs today please

## 2014-12-30 NOTE — Progress Notes (Signed)
Subjective:    Patient ID: [name], female    DOB: 04-06-1950, 64 y.o.   MRN: [id number]  HPI  Pt in for follow up from recent Ed visit, was seen following  Fall when she sustained a left  rib fracture and blunt trauma to her left knee, no abrasion or fracture of knee. Still c/o a lot of chest pain with breathing, and short course of additional poin med is prescribed to address this. Denies polyuria, polydipsia, blurred vision , or hypoglycemic episodes. Had been doing fairly well up until the fall  Review of Systems See HPI Denies recent fever or chills. Denies sinus pressure, nasal congestion, ear pain or sore throat. Denies chest congestion, productive cough or wheezing. Denies , palpitations and leg swelling Denies abdominal pain, nausea, vomiting,diarrhea or constipation.   Denies dysuria, frequency, hesitancy or incontinence. Denies headaches, seizures, numbness, or tingling. Denies uncontrolled  depression, anxiety or insomnia. Denies skin break down or rash.        Objective:   Physical Exam BP 160/68 mmHg  Pulse 80  Resp 18  Ht 5\' 1"  (1.549 m)  Wt 151 lb (68.493 kg)  BMI 28.55 kg/m2  SpO2 97% Patient alert and oriented and in no cardiopulmonary distress.Pain on breathing  HEENT: No facial asymmetry, EOMI,   oropharynx pink and moist.  Neck supple no JVD, no mass.  Chest: Clear to auscultation bilaterally.  CVS: S1, S2 no murmurs, no S3.Regular rate.  ABD: Soft non tender.   Ext: No edema  MS: Decreased  ROM spine, shoulders, hips and knees.  Skin: Intact, no ulcerations or rash noted.  Psych: Good eye contact, normal affect. Memory intact not anxious or depressed appearing.  CNS: CN 2-12 intact, power,  normal throughout.no focal deficits noted.        Assessment & Plan:  Essential hypertension Uncontrolled, inc amlodipine, nurse BP recheck visit in 1 month DASH diet and commitment to daily physical activity for a minimum of 30 minutes  discussed and encouraged, as a part of hypertension management. The importance of attaining a healthy weight is also discussed.  BP/Weight 01/12/2015 01/05/2015 12/30/2014 12/26/2014 12/22/2014 12/14/2014 4/33/2951  Systolic BP 884 166 063 016 010 932 355  Diastolic BP 73 62 68 65 60 61 68  Wt. (Lbs) 151.8 154.12 151 153 153.8 157 156.12  BMI 26.9 29.14 28.55 29.88 29.08 29.68 27.66  Some encounter information is confidential and restricted. Go to Review Flowsheets activity to see all data.        Rib pain on left side Increased and uncontrolled pain following recent fall which has resulted in fracture. Adjustment made in pain medication short term to manage her acute pain  Diabetes mellitus type 2 in obese [facility]) Improved and controlled, managed by endo, may be overcorrected as has episodes of confusion , also her polypharmacy contributes to that Ms. Castanon is reminded of the importance of commitment to daily physical activity for 30 minutes or more, as able and the need to limit carbohydrate intake to 30 to 60 grams per meal to help with blood sugar control.   The need to take medication as prescribed, test blood sugar as directed, and to call between visits if there is a concern that blood sugar is uncontrolled is also discussed.   Ms. Paro is reminded of the importance of daily foot exam, annual eye examination, and good blood sugar, blood pressure and cholesterol control.  Diabetic Labs Latest Ref Rng 12/30/2014 12/22/2014 12/14/2014 11/09/2014  11/03/2014  HbA1c - - 6.2 - - -  Microalbumin <2.0 mg/dL - - - - -  Micro/Creat Ratio 0.0 - 30.0 mg/g - - - - -  Chol 125 - 200 mg/dL 117(L) - - - -  HDL >=46 mg/dL 36(L) - - - -  Calc LDL <130 mg/dL 51 - - - -  Triglycerides <150 mg/dL 148 - - - -  Creatinine 0.50 - 0.99 mg/dL 1.12(H) - 1.15(H) 1.05(H) 2.27(H)  GFR >60.00 mL/min - - - - -   BP/Weight 01/12/2015 01/05/2015 12/30/2014 12/26/2014 12/22/2014 12/14/2014 2/70/7867  Systolic BP 544  920 100 712 197 588 325  Diastolic BP 73 62 68 65 60 61 68  Wt. (Lbs) 151.8 154.12 151 153 153.8 157 156.12  BMI 26.9 29.14 28.55 29.88 29.08 29.68 27.66  Some encounter information is confidential and restricted. Go to Review Flowsheets activity to see all data.   Foot/eye exam completion dates Latest Ref Rng 10/06/2014 10/03/2013  Eye Exam No Retinopathy No Retinopathy No Retinopathy  Foot exam Order - - -  Foot Form Completion - - -          Falls frequently New rib FRACTURE AND KNEE TRAUMA FOLLOWING RECENT FALL. HOME SAFETY REVIEWED AT VISIT  Hyperlipidemia Controlled, no change in medication Hyperlipidemia:Low fat diet discussed and encouraged.   Lipid Panel  Lab Results  Component Value Date   CHOL 117* 12/30/2014   HDL 36* 12/30/2014   LDLCALC 51 12/30/2014   TRIG 148 12/30/2014   CHOLHDL 3.3 12/30/2014        Seizure disorder Controlled, no change in medication   Light cigarette smoker (1-9 cigarettes per day) Patient counseled for approximately 5 minutes regarding the health risks of ongoing nicotine use, specifically all types of cancer, heart disease, stroke and respiratory failure. The options available for help with cessation ,the behavioral changes to assist the process, and the option to either gradully reduce usage  Or abruptly stop.is also discussed. Pt is also encouraged to set specific goals in number of cigarettes used daily, as well as to set a quit date.  Number of cigarettes/cigars currently smoking daily: 5

## 2014-12-30 NOTE — Assessment & Plan Note (Addendum)
Uncontrolled, inc amlodipine, nurse BP recheck visit in 1 month DASH diet and commitment to daily physical activity for a minimum of 30 minutes discussed and encouraged, as a part of hypertension management. The importance of attaining a healthy weight is also discussed.  BP/Weight 01/12/2015 01/05/2015 12/30/2014 12/26/2014 12/22/2014 12/14/2014 2/86/3817  Systolic BP 711 657 903 833 383 291 916  Diastolic BP 73 62 68 65 60 61 68  Wt. (Lbs) 151.8 154.12 151 153 153.8 157 156.12  BMI 26.9 29.14 28.55 29.88 29.08 29.68 27.66  Some encounter information is confidential and restricted. Go to Review Flowsheets activity to see all data.

## 2014-12-31 ENCOUNTER — Ambulatory Visit: Payer: Self-pay | Admitting: Family Medicine

## 2014-12-31 LAB — COMPLETE METABOLIC PANEL WITH GFR
ALBUMIN: 3.7 g/dL (ref 3.6–5.1)
ALK PHOS: 81 U/L (ref 33–130)
ALT: 16 U/L (ref 6–29)
AST: 16 U/L (ref 10–35)
BILIRUBIN TOTAL: 0.4 mg/dL (ref 0.2–1.2)
BUN: 23 mg/dL (ref 7–25)
CO2: 23 mmol/L (ref 20–31)
CREATININE: 1.12 mg/dL — AB (ref 0.50–0.99)
Calcium: 9.1 mg/dL (ref 8.6–10.4)
Chloride: 109 mmol/L (ref 98–110)
GFR, Est African American: 60 mL/min (ref >=60)
GFR, Est Non African American: 52 mL/min — ABNORMAL LOW (ref >=60)
GLUCOSE: 36 mg/dL — AB (ref 65–99)
Potassium: 3.7 mmol/L (ref 3.5–5.3)
SODIUM: 143 mmol/L (ref 135–146)
TOTAL PROTEIN: 6.7 g/dL (ref 6.1–8.1)

## 2014-12-31 LAB — LIPID PANEL
Cholesterol: 117 mg/dL — ABNORMAL LOW (ref 125–200)
HDL: 36 mg/dL — AB (ref >=46)
LDL Cholesterol: 51 mg/dL (ref <130)
Total CHOL/HDL Ratio: 3.3 Ratio (ref <=5.0)
Triglycerides: 148 mg/dL (ref <150)
VLDL: 30 mg/dL (ref <30)

## 2015-01-01 ENCOUNTER — Telehealth: Payer: Self-pay

## 2015-01-01 ENCOUNTER — Ambulatory Visit: Payer: Self-pay | Admitting: Neurology

## 2015-01-01 LAB — HEPATITIS C ANTIBODY: HCV AB: NEGATIVE

## 2015-01-01 LAB — VITAMIN D 25 HYDROXY (VIT D DEFICIENCY, FRACTURES): Vit D, 25-Hydroxy: 54 ng/mL (ref 30–100)

## 2015-01-01 LAB — HIV ANTIBODY (ROUTINE TESTING W REFLEX): HIV: NONREACTIVE

## 2015-01-01 NOTE — Telephone Encounter (Signed)
[name] from [facility] called to report a critical low glucose of 36. Repeated and verified.   Results sent to Dr Moshe Cipro.  Called patient and told her the next time she had to be fasting for labs it was ok to sip some coke throughout the day to keep her glucose from falling to low. Patient understands.

## 2015-01-01 NOTE — Telephone Encounter (Signed)
Noted and has been addressed

## 2015-01-05 ENCOUNTER — Ambulatory Visit (INDEPENDENT_AMBULATORY_CARE_PROVIDER_SITE_OTHER): Payer: Commercial Managed Care - HMO | Admitting: Family Medicine

## 2015-01-05 ENCOUNTER — Ambulatory Visit: Payer: Self-pay | Admitting: Internal Medicine

## 2015-01-05 ENCOUNTER — Encounter: Payer: Self-pay | Admitting: Family Medicine

## 2015-01-05 VITALS — BP 124/62 | HR 83 | Resp 16 | Ht 61.0 in | Wt 154.1 lb

## 2015-01-05 DIAGNOSIS — Z1211 Encounter for screening for malignant neoplasm of colon: Secondary | ICD-10-CM

## 2015-01-05 DIAGNOSIS — Z0000 Encounter for general adult medical examination without abnormal findings: Secondary | ICD-10-CM

## 2015-01-05 DIAGNOSIS — Z720 Tobacco use: Secondary | ICD-10-CM

## 2015-01-05 DIAGNOSIS — Z124 Encounter for screening for malignant neoplasm of cervix: Secondary | ICD-10-CM

## 2015-01-05 DIAGNOSIS — E1065 Type 1 diabetes mellitus with hyperglycemia: Secondary | ICD-10-CM

## 2015-01-05 DIAGNOSIS — F17210 Nicotine dependence, cigarettes, uncomplicated: Secondary | ICD-10-CM

## 2015-01-05 LAB — GLUCOSE, POCT (MANUAL RESULT ENTRY): POC Glucose: 215 mg/dl — AB (ref 70–99)

## 2015-01-05 LAB — POC HEMOCCULT BLD/STL (OFFICE/1-CARD/DIAGNOSTIC): Fecal Occult Blood, POC: NEGATIVE

## 2015-01-05 NOTE — Patient Instructions (Addendum)
F/u in 4  Mopnths, call if you need me sooner  No new medications, and no immunization due.  Blood pressure and cholesterol are excellent and blood sugar improved as well  Thanks for choosing [facility], we consider it a privelige to serve you. PLEASE work on smoking cessation, now at 3 per day, you need to and are able to quit.  All the best!

## 2015-01-05 NOTE — Progress Notes (Signed)
   Subjective:    Patient ID: [name], female    DOB: 11-Jul-1950, 64 y.o.   MRN: [id number]  HPI Patient is in for annual physical exam. No other health concerns are expressed or addressed at the visit. Recent labs, if available are reviewed. Immunization is reviewed , and  updated if needed.    Review of Systems See HPI     Objective:   Physical Exam  BP 124/62 mmHg  Pulse 83  Resp 16  Ht 5\' 1"  (1.549 m)  Wt 154 lb 1.9 oz (69.908 kg)  BMI 29.14 kg/m2  SpO2 96%  Pleasant well nourished female, alert and oriented x 3, in no cardio-pulmonary distress. Afebrile. HEENT No facial trauma or asymetry. Sinuses non tender.  Extra occullar muscles intact, . External ears normal, tympanic membranes clear. Oropharynx moist, no exudate, edentulous . Neck: supple, no adenopathy,JVD or thyromegaly.No bruits.  Chest: Clear to ascultation bilaterally.No crackles or wheezes. Non tender to palpation  Breast: No asymetry,no masses or lumps. No tenderness. No nipple discharge or inversion. No axillary or supraclavicular adenopathy  Cardiovascular system; Heart sounds normal,  S1 and  S2 ,no S3.  No murmur, or thrill. Apical beat not displaced Peripheral pulses normal.  Abdomen: Soft, non tender, no organomegaly or masses. No bruits. Bowel sounds normal. No guarding, tenderness or rebound.  Rectal:  Normal sphincter tone. No mass.No rectal masses.  Guaiac negative stool.  GU: External genitalia normal female genitalia , female distribution of hair. No lesions. Urethral meatus normal in size, no  Prolapse, no lesions visibly  Present. Bladder non tender. Vagina pink and moist , with no visible lesions , discharge present . Adequate pelvic support no  cystocele or rectocele noted  Uterus absent, no adnexal masses, no  adnexal tenderness.   Musculoskeletal exam: Rreduced  ROM of spine, adequate in  hips , shoulders and knees. No deformity ,swelling or crepitus  noted. No muscle wasting or atrophy.   Neurologic: Cranial nerves 2 to 12 intact. Power, tone ,sensation and reflexes normal throughout. No disturbance in gait. No tremor.  Skin: Intact, no ulceration, erythema , scaling or rash noted. Pigmentation normal throughout  Psych; Normal mood and affect. Judgement and concentration normal        Assessment & Plan:

## 2015-01-07 LAB — CYTOLOGY - PAP

## 2015-01-08 ENCOUNTER — Telehealth: Payer: Self-pay

## 2015-01-08 NOTE — Telephone Encounter (Signed)
Called patient to see if she planned to reschedule (or wants me to) her MRI appointment that she cancelled for 11/11/14.

## 2015-01-08 NOTE — Telephone Encounter (Signed)
Pt returned call. States she cancelled MRI due to gum surgery, and then she fell and broke a few ribs. Pt is healing well, still in a fair amount of pain. Does plan to reschedule MRI. Hoping to reschedule near the end of November. [facility] Radiology scheduling number given to patient. Pt wants to know if she should keep her appointment if she has not had MRI by that time or should she reschedule. Pt's appointment is 02/03/15. Which is a rescheduled appointment from October 21.

## 2015-01-11 ENCOUNTER — Telehealth: Payer: Self-pay | Admitting: Family Medicine

## 2015-01-11 ENCOUNTER — Other Ambulatory Visit: Payer: Self-pay | Admitting: Family Medicine

## 2015-01-11 ENCOUNTER — Encounter: Payer: Self-pay | Admitting: Family Medicine

## 2015-01-11 DIAGNOSIS — E1065 Type 1 diabetes mellitus with hyperglycemia: Secondary | ICD-10-CM | POA: Diagnosis not present

## 2015-01-11 DIAGNOSIS — F17210 Nicotine dependence, cigarettes, uncomplicated: Secondary | ICD-10-CM | POA: Insufficient documentation

## 2015-01-11 IMAGING — MR MR HEAD WO/W CM
6 of 14 series · 18 of 48 positions shown · IV contrast (15ml Multihance)
Comparison: CT head 06/06/2013, MRI 05/01/2012

CLINICAL DATA: Meningioma resection November 2011.  Seizure

EXAM:
MRI HEAD WITHOUT AND WITH CONTRAST
TECHNIQUE: Multiplanar, multiecho pulse sequences of the brain and surrounding
structures were obtained without and with intravenous contrast.
CONTRAST:  20mL MULTIHANCE GADOBENATE DIMEGLUMINE 529 MG/ML IV SOLN

[Series 6: T2 · axial · 5.0mm · 0.51mm/px · z∈[-105,+35]mm · 2 of 23 slices shown (1 of 2)]
[im 1/23]
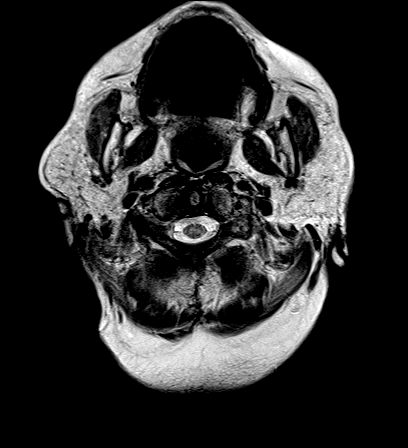
[im 23/23]
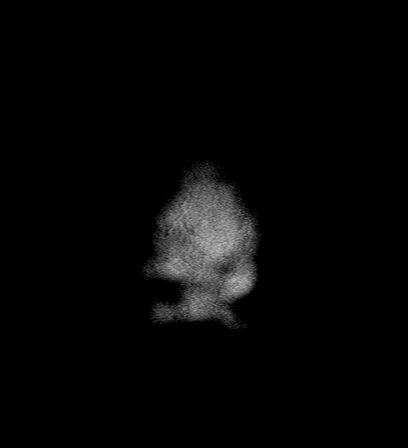

[Series 7: FLAIR · axial · 5.0mm · 0.38mm/px · z∈[-104,+36]mm · 2 of 23 slices shown]
[im 1/23]
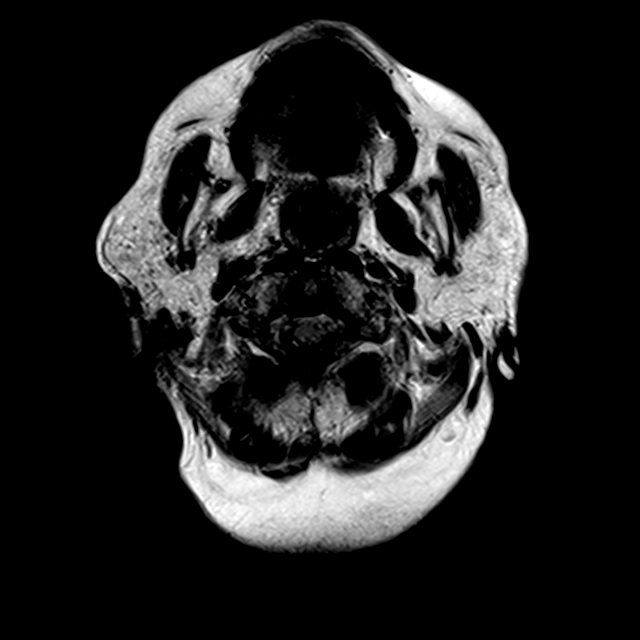
[im 23/23]
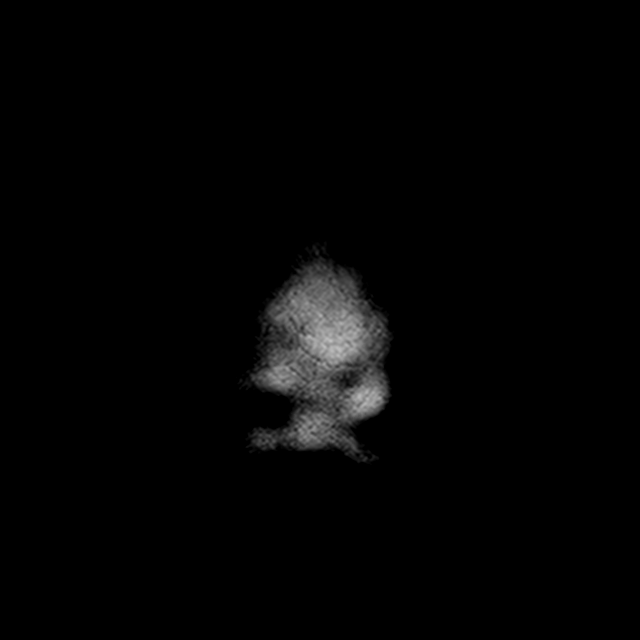

[Series 10: T2 · coronal · 3.0mm · 0.22mm/px · 1 of 36 slices shown (2 of 2)]
[im 1/36]
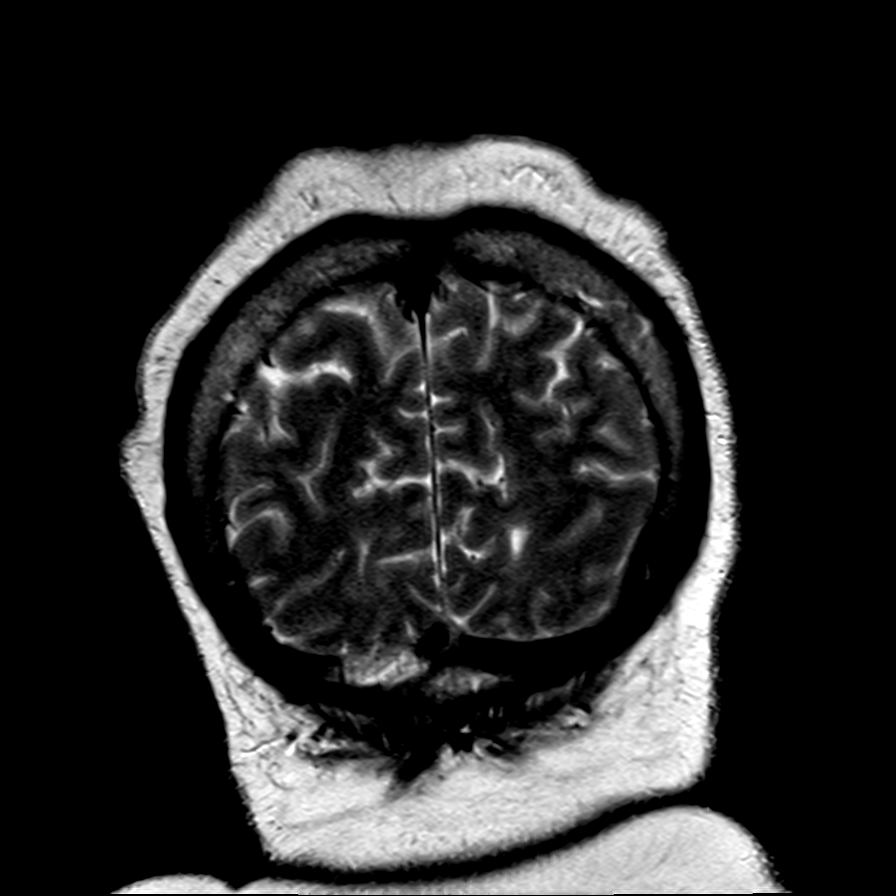

[Series 12: T1 post-contrast · axial · 2.0mm · 0.45mm/px · z∈[-118,+66]mm · 9 of 95 slices shown (1 of 3)]
[im 1/95]
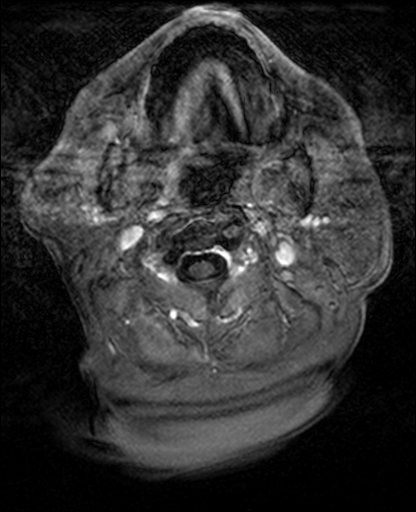
[im 12/95]
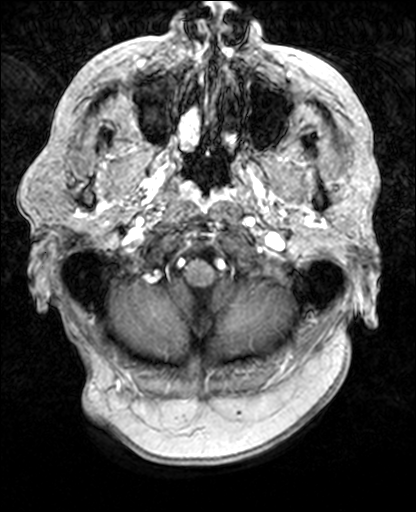
[im 24/95]
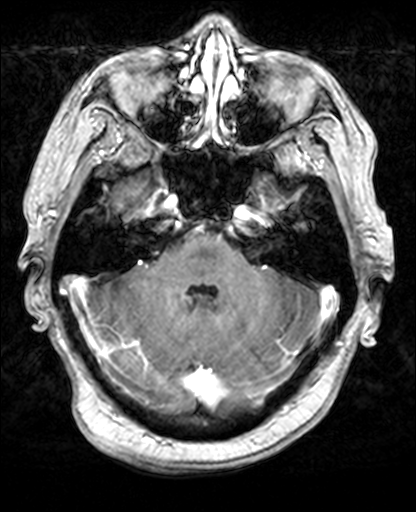
[im 36/95]
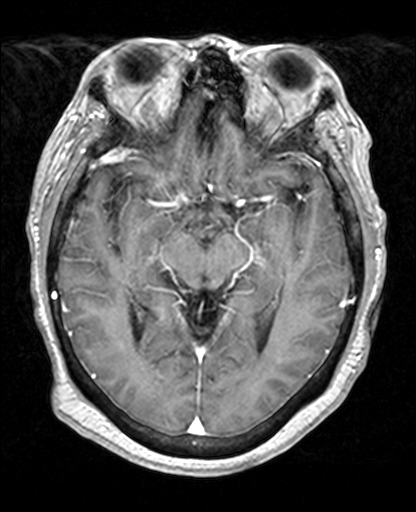
[im 48/95]
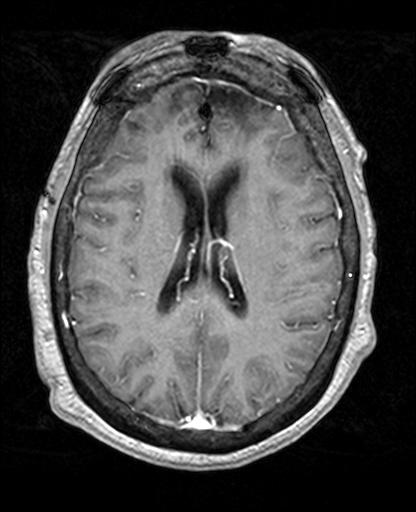
[im 59/95]
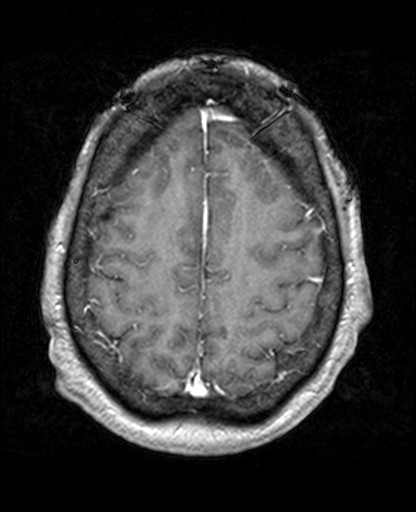
[im 71/95]
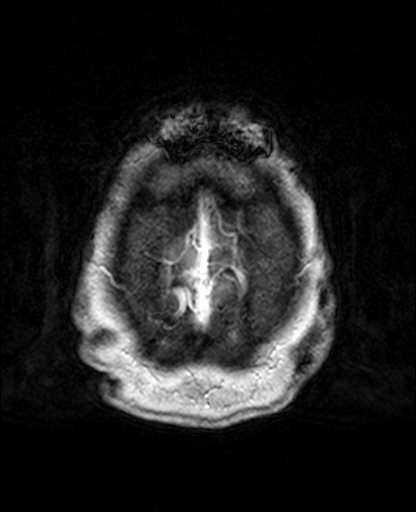
[im 83/95]
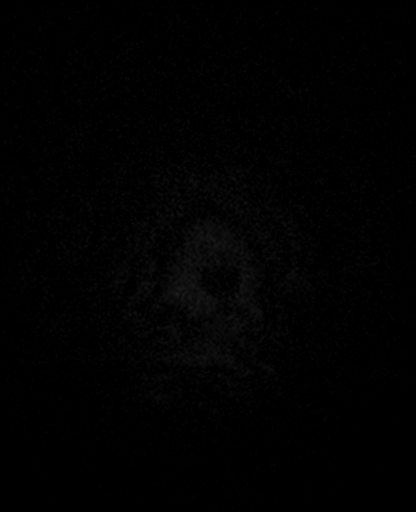
[im 95/95]
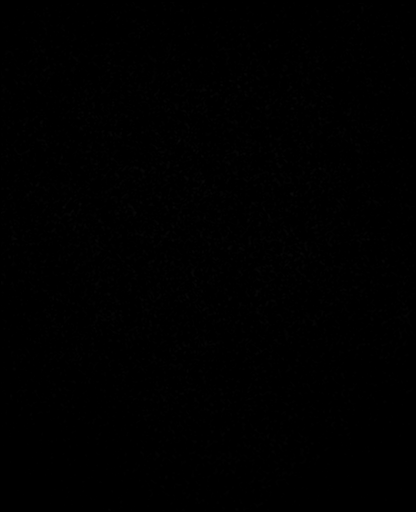

[Series 13: T1 post-contrast · coronal · 5.0mm · 0.38mm/px · 2 of 24 slices shown (2 of 3)]
[im 1/24]
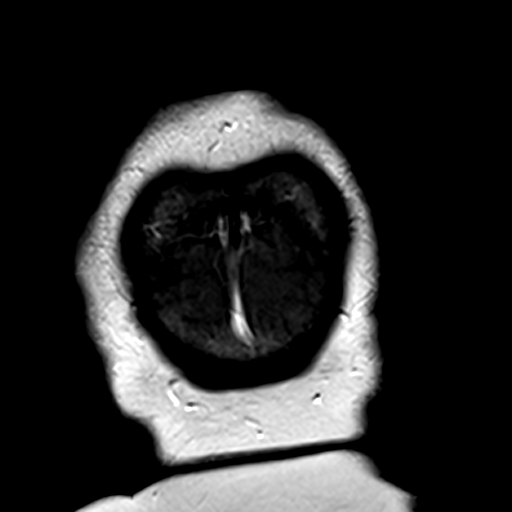
[im 24/24]
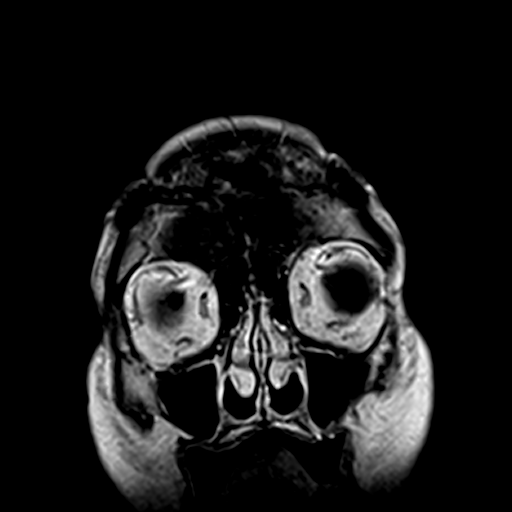

[Series 14: T1 post-contrast · sagittal · 5.0mm · 0.43mm/px · 2 of 20 slices shown (3 of 3)]
[im 1/20]
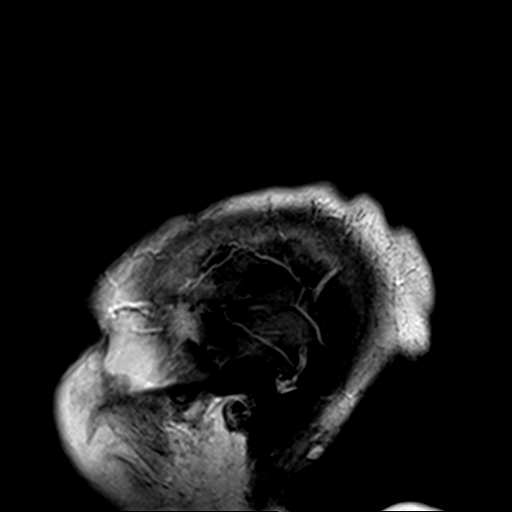
[im 20/20]
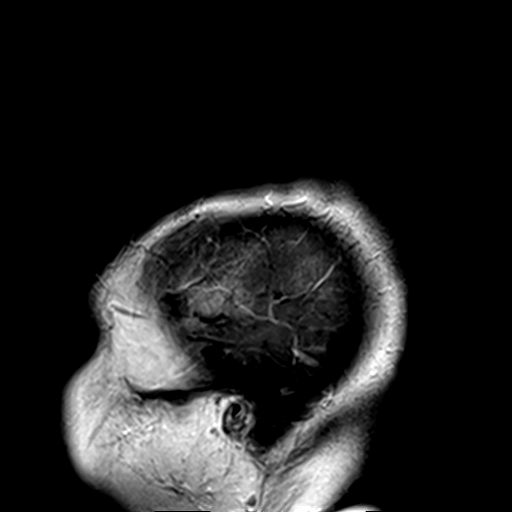

[18 of 48 positions shown; findings below may reference images not displayed]

FINDINGS: Bifrontal craniotomy for resection of meningioma along the planum
sphenoidale. There is postop encephalomalacia in the frontal lobes
bilaterally including the gyrus rectus. This is stable. Mild amount
of chronic hemosiderin is present in the frontal lobes from prior
resection.

Postcontrast imaging reveals improvement in the small amount of
dural based enhancement along the cribriform plate compared with the
prior study. No recurrent tumor identified. No enhancing lesions
elsewhere in the brain.

Ventricle size is normal. No shift of the midline structures.
Extensive hyperintensity throughout the pons is stable and may be
related to microvascular ischemia.

Negative for acute infarct.  Pituitary is normal in size.
IMPRESSION: Postop resection of planum sphenoidale meningioma. No recurrent
tumor. Other findings are stable since the prior study.

## 2015-01-11 NOTE — Assessment & Plan Note (Signed)
Annual exam as documented. Counseling done  re healthy lifestyle involving commitment to 150 minutes exercise per week, heart healthy diet, and attaining healthy weight.The importance of adequate sleep also discussed. Regular seat belt use and home safety, is also discussed. Changes in health habits are decided on by the patient with goals and time frames  set for achieving them. Immunization and cancer screening needs are specifically addressed at this visit.  

## 2015-01-11 NOTE — Assessment & Plan Note (Signed)

## 2015-01-11 NOTE — Telephone Encounter (Signed)
Concern addressed

## 2015-01-11 NOTE — Telephone Encounter (Signed)
Please call [name] she has questions about some paper work she has received, please advise?

## 2015-01-12 ENCOUNTER — Encounter: Payer: Self-pay | Admitting: Internal Medicine

## 2015-01-12 ENCOUNTER — Ambulatory Visit (INDEPENDENT_AMBULATORY_CARE_PROVIDER_SITE_OTHER): Payer: Commercial Managed Care - HMO | Admitting: Internal Medicine

## 2015-01-12 ENCOUNTER — Other Ambulatory Visit: Payer: Self-pay

## 2015-01-12 VITALS — BP 153/73 | HR 97 | Temp 97.0°F | Ht 63.0 in | Wt 151.8 lb

## 2015-01-12 DIAGNOSIS — E1065 Type 1 diabetes mellitus with hyperglycemia: Secondary | ICD-10-CM | POA: Diagnosis not present

## 2015-01-12 DIAGNOSIS — K5900 Constipation, unspecified: Secondary | ICD-10-CM

## 2015-01-12 DIAGNOSIS — K219 Gastro-esophageal reflux disease without esophagitis: Secondary | ICD-10-CM | POA: Diagnosis not present

## 2015-01-12 NOTE — Patient Instructions (Signed)
Dynamic pelvic MRI with valsalva to evaluate for pelvic floor descent/ rectocele, etc -difficulty in evacuating stool   Continue Rabeprazole daily for GERD   Further recommendations to follow

## 2015-01-12 NOTE — Progress Notes (Addendum)
Primary Care Physician:  Tula Nakayama, MD Primary Gastroenterologist:  Dr. Gala Romney  Pre-Procedure History & Physical: HPI:  [name] is a 64 y.o. female here for followup of GERD. And constipation. Patient states she's had difficulty having a bowel movement for a long time. Denies actual "constipation". States stool needs to come out she just cannot get it out. She had a very difficult vaginal delivery with a "tear" delivery her son many years ago. She's had trouble ever since that is getting insidiously worse. She's been on Amitiza 8 mcg twice daily. No improvement in bowel function. May go days without what she describes as an adequate bowel movement although she gets the urge frequently.  EGD colonoscopy not too long ago as outlined. Patient has not had any rectal bleeding or melena. No nausea or vomiting.`  Past Medical History  Diagnosis Date  . Bipolar disorder ([place])   . CVA (cerebral infarction)   . Pancreatitis 2006    due to Depakote with normal EUS   . Osteoporosis   . Chronic back pain   . Trigger finger   . Anxiety disorder   . Hypertension   . Migraines     chronic headaches  . Diverticulosis     TCS 9/08 by Dr. Delfin Edis for diarrhea . Bx for micro scopic colitis negative.   . Schatzki's ring     non critical / EGD with ED 8/2011with RMR  . S/P colonoscopy 2536,6440, 2011    left-sided diverticula, hx of simple adenomas . 2011, random bx negative for microscopic colitis  . Glaucoma   . Allergic rhinitis   . Hypothyroidism     thyroid goiter  . Anemia   . Blood transfusion   . GERD (gastroesophageal reflux disease)   . Stroke [facility])     left sided weakness  . Seizures ([place])     unknown etiology-on meds-last seizure was 3 yerars ago  . Anxiety   . Depression   . Metabolic encephalopathy 3/47/4259  . Sleep apnea     on CPAP  . Arthritis   . Gum symptoms     infection on antibiotic  . Chronic neck pain   . Mononeuritis lower limb   . Frequent falls    . Diabetes mellitus     Type II  . Complication of anesthesia     "hard to put sleep"    Past Surgical History  Procedure Laterality Date  . Abdominal hysterectomy  1978  . Cholecystectomy  1984  . Ovarian cyst removal    . Carpal tunnel release Left 07/22/04    Dr. Aline Brochure  . Breast reduction surgery  1994  . Cataract extraction Bilateral   . Biopsy thyroid  2009  . Surgical excision of 3 tumors from right thigh and right buttock  and left upper thigh  2010  . Back surgery  July 2012  . Spine surgery  09/29/2010    Dr. Rolena Infante  . Maloney dilation  12/29/2010    RMR;  . Esophagogastroduodenoscopy  12/29/2010    Haralambos Yeatts-Retained food in the esophagus and stomach, small hiatal hernia, status post Maloney dilation of the esophagus  . Craniotomy  11/23/2011    Procedure: CRANIOTOMY TUMOR EXCISION;  Surgeon: Hosie Spangle, MD;  Location: [place] NEURO ORS;  Service: Neurosurgery;  Laterality: N/A;  Craniotomy for tumor resection  . Brain surgery  11/2011    resection of meningioma  . Colonoscopy N/A 09/25/2012    DGL:OVFIEPP diverticulosis.  colonic polyp-removed : tubular adenoma  . Esophagogastroduodenoscopy N/A 09/25/2012    EFE:OFHQRFXJ atonic baggy esophagus status post Maloney dilation 27 F. Hiatal hernia  . Givens capsule study N/A 01/15/2013    NORMAL.   . Bacterial overgrowth test N/A 05/05/2013    Procedure: BACTERIAL OVERGROWTH TEST;  Surgeon: Daneil Dolin, MD;  Location: [facility];  Service: Endoscopy;  Laterality: N/A;  7:30  . Transthoracic echocardiogram  2010    EF 60-65%, mild conc LVH, grade 1 diastolic dysfunction; mildly calcified MV annulus with mildly thickened leaflets, mildly calcified MR annulus  . Nm myocar perf wall motion  2006    "relavtiely normal" persantine, mild anterior thinning (breast attenuation artifact), no region of scar/ischemia  . Cardiac catheterization  05/10/2005    normal coronaries, normal LV systolic function and EF (Dr. Jackie Plum)  .  Tooth extraction Bilateral 12/14/2014    Procedure: REMOVAL OF BILATERAL MANDIBULAR EXOSTOSES;  Surgeon: Diona Browner, DDS;  Location: [place];  Service: Oral Surgery;  Laterality: Bilateral;    Prior to Admission medications   Medication Sig Start Date End Date Taking? Authorizing Provider  ACCU-CHEK FASTCLIX LANCETS MISC Use to test blood sugar 4 times daily. Dx: E10.65 12/03/14  Yes Philemon Kingdom, MD  Alcohol Swabs (B-D SINGLE USE SWABS REGULAR) PADS Use for injections and glucose testing 8 times daily. Dx: E10.65 12/03/14  Yes Philemon Kingdom, MD  amLODipine (NORVASC) 5 MG tablet Take 1 tablet (5 mg total) by mouth daily. 12/30/14  Yes [name], MD  aspirin EC 81 MG tablet Take 81 mg by mouth daily.   Yes Historical Provider, MD  Blood Glucose Calibration (ACCU-CHEK SMARTVIEW CONTROL) LIQD 1 each by Other route as needed. 12/03/14  Yes Philemon Kingdom, MD  Blood Glucose Monitoring Suppl (ACCU-CHEK NANO SMARTVIEW) W/DEVICE KIT 1 each by Does not apply route daily. Dx: E10.65 12/03/14  Yes Philemon Kingdom, MD  Cholecalciferol (D 5000) 5000 UNITS capsule Take 5,000 Units by mouth daily.   Yes Historical Provider, MD  clobetasol cream (TEMOVATE) 8.83 % Apply 1 application topically daily.   Yes Historical Provider, MD  cloNIDine (CATAPRES) 0.3 MG tablet TAKE 1 TABLET BY MOUTH EVERY EIGHT HOURS. ONCE AT 8AM, [place], AND 12 MIDNIGHT. 09/18/14  Yes [name], MD  diclofenac sodium (VOLTAREN) 1 % GEL Apply 2 g topically daily as needed (Pain). 11/20/14  Yes [name], MD  dicyclomine (BENTYL) 10 MG capsule TAKE 1 CAPSULE BY M OUTH THREE TIMES DAILY BEFORE MEALS. 01/11/15  Yes [name], MD  fluticasone (FLONASE) 50 MCG/ACT nasal spray Place 2 sprays into both nostrils daily.   Yes Historical Provider, MD  glucose blood (ACCU-CHEK SMARTVIEW) test strip Use to test blood sugar 4 times daily as instructed. Dx: E10.65 12/03/14  Yes Philemon Kingdom, MD  hydroxychloroquine  (PLAQUENIL) 200 MG tablet Take 200 mg by mouth daily.   Yes Historical Provider, MD  insulin aspart (NOVOLOG FLEXPEN) 100 UNIT/ML FlexPen Inject 20-22 Units into the skin 3 (three) times daily with meals. 09/21/14  Yes Philemon Kingdom, MD  Insulin Glargine (TOUJEO SOLOSTAR) 300 UNIT/ML SOPN Inject 55 Units into the skin at bedtime. Patient taking differently: Inject 50 Units into the skin at bedtime.  09/21/14  Yes Philemon Kingdom, MD  Insulin Pen Needle (CLICKFINE PEN NEEDLES) 32G X 4 MM MISC Use 4x a day 09/21/14  Yes Philemon Kingdom, MD  lamoTRIgine (LAMICTAL) 100 MG tablet Take 150 mg by mouth daily.   Yes  Historical Provider, MD  levothyroxine (SYNTHROID, LEVOTHROID) 50 MCG tablet Take 1 tablet (50 mcg total) by mouth daily. And 1/2 tablet on Sunday. 12/22/14  Yes Philemon Kingdom, MD  LORazepam (ATIVAN) 0.5 MG tablet Take 1 tablet (0.5 mg total) by mouth 3 (three) times daily. 11/09/14  Yes Cloria Spring, MD  losartan (COZAAR) 100 MG tablet TAKE 1 TABLET BY MOUTH ONCE DAILY. 09/18/14  Yes [name], MD  lubiprostone (AMITIZA) 8 MCG capsule Take 1 capsule (8 mcg total) by mouth daily after breakfast. 11/05/14  Yes Mahala Menghini, PA-C  methocarbamol (ROBAXIN) 500 MG tablet Take 500 mg by mouth daily. 12/21/14  Yes Historical Provider, MD  metoprolol (LOPRESSOR) 50 MG tablet TAKE 1 TABLET BY MOUTH TWICE DAILY. 09/18/14  Yes Pixie Casino, MD  montelukast (SINGULAIR) 10 MG tablet TAKE ONE TABLET BY MOUTH ONCE DAILY. 08/24/14  Yes [name], MD  Multiple Vitamins-Minerals (ONE-A-DAY 50 PLUS PO) Take 1 tablet by mouth daily.   Yes Historical Provider, MD  Nystatin ([place]) 100000 UNIT/GM POWD APPLY TO AFFECTED AREA 4 TIMES DAILY. Patient taking differently: Apply topically 4 (four) times daily. APPLY TO AFFECTED AREA 4 TIMES DAILY. 10/20/14  Yes [name], MD  pregabalin (LYRICA) 75 MG capsule Take 75 mg by mouth 2 (two) times daily.  10/26/11  Yes Carole Civil, MD    promethazine (PHENERGAN) 12.5 MG tablet Take 1 tablet (12.5 mg total) by mouth every 6 (six) hours as needed for nausea or vomiting. 08/20/14  Yes Mahala Menghini, PA-C  RABEprazole (ACIPHEX) 20 MG tablet TAKE 1 TABLET BY MOUTH TWICE DAILY. 07/28/14  Yes Carlis Stable, NP  RESTASIS 0.05 % ophthalmic emulsion Place 1 drop into both eyes 2 (two) times daily.  02/04/14  Yes Historical Provider, MD  risperiDONE (RISPERDAL) 0.5 MG tablet Take 1 tablet (0.5 mg total) by mouth at bedtime. 11/09/14  Yes Cloria Spring, MD  rosuvastatin (CRESTOR) 5 MG tablet TAKE 1 TABLET BY MOUTH AT BEDTIME. 12/22/14  Yes [name], MD  sertraline (ZOLOFT) 100 MG tablet Take 2 tablets (200 mg total) by mouth daily. 11/09/14  Yes Cloria Spring, MD  spironolactone (ALDACTONE) 25 MG tablet TAKE ONE TABLET BY MOUTH DAILY. 12/22/14  Yes [name], MD  traZODone (DESYREL) 150 MG tablet Take 2 tablets (300 mg total) by mouth at bedtime. 11/09/14  Yes Cloria Spring, MD  vitamin E 400 UNIT capsule Take 400 Units by mouth daily.   Yes Historical Provider, MD  ZETIA 10 MG tablet TAKE ONE TABLET BY MOUTH ONCE DAILY. 12/22/14  Yes [name], MD  docusate sodium (COLACE) 100 MG capsule Take 2 capsules (200 mg total) by mouth at bedtime. Patient not taking: Reported on 01/12/2015 06/07/14   Kathie Dike, MD  HYDROcodone-acetaminophen (NORCO/VICODIN) 5-325 MG tablet Take 1 tablet by mouth 2 (two) times daily. Patient not taking: Reported on 01/12/2015 12/30/14   [name], MD    Allergies as of 01/12/2015 - Review Complete 01/12/2015  Allergen Reaction Noted  . Cephalexin Hives   . Iron Nausea And Vomiting 11/15/2012  . Milk-related compounds Other (See Comments) 07/29/2008  . Penicillins Hives   . Phenazopyridine hcl Hives 06/19/2007    Family History  Problem Relation Age of Onset  . Heart attack Mother     HTN  . Pneumonia Father   . Kidney failure Father   . Diabetes Father   . Pancreatic  cancer  Sister   . Diabetes Brother   . Hypertension Brother   . Diabetes Brother   . Colon cancer Neg Hx   . Anesthesia problems Neg Hx   . Hypotension Neg Hx   . Malignant hyperthermia Neg Hx   . Pseudochol deficiency Neg Hx   . Stroke Maternal Grandmother   . Heart attack Maternal Grandfather   . Cancer Sister     breast   . Alcohol abuse Maternal Uncle   . Hypertension Brother     Social History   Social History  . Marital Status: Divorced    Spouse Name: N/A  . Number of Children: 1  . Years of Education: 12   Occupational History  . disabled     Social History Main Topics  . Smoking status: Current Every Day Smoker -- 0.25 packs/day for 7 years    Types: Cigarettes  . Smokeless tobacco: Never Used     Comment: quit for 5 weeks now   . Alcohol Use: No     Comment:    . Drug Use: No  . Sexual Activity: No   Other Topics Concern  . Not on file   Social History Narrative    Review of Systems: See HPI, otherwise negative ROS  Physical Exam: BP 153/73 mmHg  Pulse 97  Temp(Src) 97 F (36.1 C) (Oral)  Ht _0  (1.6 m)  Wt 151 lb 12.8 oz (68.856 kg)  BMI 26.90 kg/m2 General:   Alert,  Well-developed, well-nourished, pleasant and cooperative in NAD Skin:  Intact without significant lesions or rashes. Eyes:  Sclera clear, no icterus.   Conjunctiva pink. Ears:  Normal auditory acuity. Nose:  No deformity, discharge,  or lesions. Mouth:  No deformity or lesions. Neck:  Supple; no masses or thyromegaly. No significant cervical adenopathy. Lungs:  Clear throughout to auscultation.   No wheezes, crackles, or rhonchi. No acute distress. Heart:  Regular rate and rhythm; no murmurs, clicks, rubs,  or gallops. Abdomen: Non-distended, normal bowel sounds.  Soft and nontender without appreciable mass or hepatosplenomegaly.  Pulses:  Normal pulses noted. Extremities:  Without clubbing or edema.  Impression:  64 year old lady with long-standing GERD-well controlled on  rabeprazole. Long-standing complaints of difficulty evacuating. Last colonoscopy 2014. I do sense that she has either functional or physiologic outlet obstruction. I doubt functional etiology. More likely, she has pelvic floor dyspnea with or without rectocele, etc. Amitiza really has not helped her symptoms.   Recommendations:    Dynamic pelvic MRI with valsalva to evaluate for pelvic floor descent/ rectocele, etc -difficulty in evacuating stool  Continue Rabeprazole daily for GERD.   Further recommendations to follo  Notice: This dictation was prepared with Dragon dictation along with smaller phrase technology. Any transcriptional errors that result from this process are unintentional and may not be corrected upon review.

## 2015-01-15 ENCOUNTER — Telehealth: Payer: Self-pay | Admitting: Neurology

## 2015-01-15 NOTE — Telephone Encounter (Signed)
Spoke with patient. Has a lot of upcoming appointments. Wants to schedule MRI at [facility], but will contact [place] if there is too long of a wait.

## 2015-01-15 NOTE — Telephone Encounter (Signed)
Attempted to reach patient. Busy line.

## 2015-01-15 NOTE — Telephone Encounter (Signed)
Pt needs to talk to someone about her MRI that needs to be done at [facility] please call patient at [phone]

## 2015-01-20 ENCOUNTER — Other Ambulatory Visit: Payer: Self-pay | Admitting: Nurse Practitioner

## 2015-01-20 ENCOUNTER — Other Ambulatory Visit: Payer: Self-pay | Admitting: Family Medicine

## 2015-01-20 ENCOUNTER — Other Ambulatory Visit (HOSPITAL_COMMUNITY): Payer: Self-pay | Admitting: Psychiatry

## 2015-01-20 DIAGNOSIS — E1065 Type 1 diabetes mellitus with hyperglycemia: Secondary | ICD-10-CM | POA: Diagnosis not present

## 2015-01-21 ENCOUNTER — Telehealth: Payer: Self-pay | Admitting: Family Medicine

## 2015-01-21 NOTE — Telephone Encounter (Signed)
[name] Ms. Woodrum is asking if you would please return her call, she has some questions

## 2015-01-21 NOTE — Telephone Encounter (Signed)
Will update [facility] referrals for patient

## 2015-01-22 NOTE — Telephone Encounter (Addendum)
Referrals have been updated with [facility]

## 2015-01-26 ENCOUNTER — Telehealth: Payer: Self-pay | Admitting: General Practice

## 2015-01-26 NOTE — Telephone Encounter (Signed)
Pt is set up for MRI on 02/08/15 @ 9:45. She is aware of the appointment

## 2015-01-26 NOTE — Telephone Encounter (Signed)
Per RMR's office note on 01/12/2015, the patient needs to have Dynamic pelvic MRI with valsalva to evaluate for pelvic floor descent/ rectocele, etc -difficulty in evacuating stool.  It looks like it was scheduled at [facility], however it was cancelled stating it needs to be done at [place].  Please follow-up on the scheduling status of the patient's MRI.

## 2015-01-30 NOTE — Assessment & Plan Note (Signed)

## 2015-01-30 NOTE — Assessment & Plan Note (Signed)
Controlled, no change in medication Hyperlipidemia:Low fat diet discussed and encouraged.   Lipid Panel  Lab Results  Component Value Date   CHOL 117* 12/30/2014   HDL 36* 12/30/2014   LDLCALC 51 12/30/2014   TRIG 148 12/30/2014   CHOLHDL 3.3 12/30/2014

## 2015-01-30 NOTE — Assessment & Plan Note (Signed)
Controlled, no change in medication  

## 2015-01-30 NOTE — Assessment & Plan Note (Signed)
New rib FRACTURE AND KNEE TRAUMA FOLLOWING RECENT FALL. HOME SAFETY REVIEWED AT VISIT

## 2015-01-30 NOTE — Assessment & Plan Note (Signed)
Increased and uncontrolled pain following recent fall which has resulted in fracture. Adjustment made in pain medication short term to manage her acute pain

## 2015-01-30 NOTE — Assessment & Plan Note (Addendum)
Improved and controlled, managed by endo, may be overcorrected as has episodes of confusion , also her polypharmacy contributes to that [name] is reminded of the importance of commitment to daily physical activity for 30 minutes or more, as able and the need to limit carbohydrate intake to 30 to 60 grams per meal to help with blood sugar control.   The need to take medication as prescribed, test blood sugar as directed, and to call between visits if there is a concern that blood sugar is uncontrolled is also discussed.   [name] is reminded of the importance of daily foot exam, annual eye examination, and good blood sugar, blood pressure and cholesterol control.  Diabetic Labs Latest Ref Rng 12/30/2014 12/22/2014 12/14/2014 11/09/2014 11/03/2014  HbA1c - - 6.2 - - -  Microalbumin <2.0 mg/dL - - - - -  Micro/Creat Ratio 0.0 - 30.0 mg/g - - - - -  Chol 125 - 200 mg/dL 117(L) - - - -  HDL >=46 mg/dL 36(L) - - - -  Calc LDL <130 mg/dL 51 - - - -  Triglycerides <150 mg/dL 148 - - - -  Creatinine 0.50 - 0.99 mg/dL 1.12(H) - 1.15(H) 1.05(H) 2.27(H)  GFR >60.00 mL/min - - - - -   BP/Weight 01/12/2015 01/05/2015 12/30/2014 12/26/2014 12/22/2014 12/14/2014 [id number]  Systolic BP 0000000 [id number] 0000000 [id number]  Diastolic BP 73 62 68 65 60 61 68  Wt. (Lbs) 151.8 154.12 151 153 153.8 157 156.12  BMI 26.9 29.14 28.55 29.88 29.08 29.68 27.66  Some encounter information is confidential and restricted. Go to Review Flowsheets activity to see all data.   Foot/eye exam completion dates Latest Ref Rng 10/06/2014 10/03/2013  Eye Exam No Retinopathy No Retinopathy No Retinopathy  Foot exam Order - - -  Foot Form Completion - - -

## 2015-02-01 ENCOUNTER — Ambulatory Visit (HOSPITAL_COMMUNITY): Admission: RE | Admit: 2015-02-01 | Payer: Commercial Managed Care - HMO | Source: Ambulatory Visit

## 2015-02-01 ENCOUNTER — Other Ambulatory Visit (HOSPITAL_COMMUNITY): Payer: Self-pay | Admitting: Neurology

## 2015-02-01 ENCOUNTER — Other Ambulatory Visit: Payer: Self-pay | Admitting: Family Medicine

## 2015-02-01 ENCOUNTER — Ambulatory Visit (HOSPITAL_COMMUNITY)
Admission: RE | Admit: 2015-02-01 | Discharge: 2015-02-01 | Disposition: A | Discharge status: Home / Self Care | Payer: Commercial Managed Care - HMO | Source: Ambulatory Visit | Attending: Neurology | Admitting: Neurology

## 2015-02-01 DIAGNOSIS — Z8669 Personal history of other diseases of the nervous system and sense organs: Secondary | ICD-10-CM | POA: Diagnosis not present

## 2015-02-01 DIAGNOSIS — Z86011 Personal history of benign neoplasm of the brain: Secondary | ICD-10-CM

## 2015-02-01 DIAGNOSIS — G40109 Localization-related (focal) (partial) symptomatic epilepsy and epileptic syndromes with simple partial seizures, not intractable, without status epilepticus: Secondary | ICD-10-CM

## 2015-02-01 DIAGNOSIS — E1065 Type 1 diabetes mellitus with hyperglycemia: Secondary | ICD-10-CM | POA: Diagnosis not present

## 2015-02-01 DIAGNOSIS — S0990XA Unspecified injury of head, initial encounter: Secondary | ICD-10-CM | POA: Diagnosis not present

## 2015-02-02 ENCOUNTER — Ambulatory Visit (INDEPENDENT_AMBULATORY_CARE_PROVIDER_SITE_OTHER): Payer: Medicare HMO | Admitting: Psychiatry

## 2015-02-02 ENCOUNTER — Telehealth (HOSPITAL_COMMUNITY): Payer: Self-pay | Admitting: *Deleted

## 2015-02-02 ENCOUNTER — Telehealth: Payer: Self-pay

## 2015-02-02 ENCOUNTER — Encounter (HOSPITAL_COMMUNITY): Payer: Self-pay | Admitting: Psychiatry

## 2015-02-02 ENCOUNTER — Other Ambulatory Visit (HOSPITAL_COMMUNITY): Payer: Commercial Managed Care - HMO

## 2015-02-02 VITALS — BP 116/56 | HR 61 | Ht 61.0 in | Wt 158.0 lb

## 2015-02-02 DIAGNOSIS — F331 Major depressive disorder, recurrent, moderate: Secondary | ICD-10-CM | POA: Diagnosis not present

## 2015-02-02 DIAGNOSIS — E1169 Type 2 diabetes mellitus with other specified complication: Secondary | ICD-10-CM

## 2015-02-02 DIAGNOSIS — E669 Obesity, unspecified: Principal | ICD-10-CM

## 2015-02-02 DIAGNOSIS — I10 Essential (primary) hypertension: Secondary | ICD-10-CM

## 2015-02-02 MED ORDER — TRAZODONE HCL 150 MG PO TABS: 300.0000 mg | ORAL_TABLET | Freq: Every day | ORAL | Status: DC

## 2015-02-02 MED ORDER — LORAZEPAM 0.5 MG PO TABS: 0.5000 mg | ORAL_TABLET | Freq: Three times a day (TID) | ORAL | Status: DC

## 2015-02-02 MED ORDER — RISPERIDONE 0.5 MG PO TABS: 0.5000 mg | ORAL_TABLET | Freq: Every day | ORAL | Status: DC

## 2015-02-02 MED ORDER — SERTRALINE HCL 100 MG PO TABS: 200.0000 mg | ORAL_TABLET | Freq: Every day | ORAL | Status: DC
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
## 2015-02-02 NOTE — Telephone Encounter (Signed)
Referral per [facility] Tier 4 list.  Patient agrees to referral.

## 2015-02-02 NOTE — Telephone Encounter (Signed)
Dr. Harrington Challenger printed script for pt Lorazepam (Ativan) 0.5 mg TID and asked if office could call script in due to pt informing her that she is not able to take it to her pharmacy. Per Dr. Harrington Challenger to call in script to pt pharmacy. Script was printed with quantity of 90 with 2 refills. Called pt pharmacy and spoke with Lovena Le and he stated that they will take care of it. Printed script was shredded.

## 2015-02-03 ENCOUNTER — Other Ambulatory Visit: Payer: Commercial Managed Care - HMO

## 2015-02-03 ENCOUNTER — Ambulatory Visit (INDEPENDENT_AMBULATORY_CARE_PROVIDER_SITE_OTHER): Payer: Commercial Managed Care - HMO | Admitting: Neurology

## 2015-02-03 ENCOUNTER — Encounter: Payer: Self-pay | Admitting: Neurology

## 2015-02-03 VITALS — BP 136/58 | HR 77 | Ht 61.0 in | Wt 148.0 lb

## 2015-02-03 DIAGNOSIS — Z79899 Other long term (current) drug therapy: Secondary | ICD-10-CM | POA: Diagnosis not present

## 2015-02-03 DIAGNOSIS — G43009 Migraine without aura, not intractable, without status migrainosus: Secondary | ICD-10-CM

## 2015-02-03 DIAGNOSIS — I69359 Hemiplegia and hemiparesis following cerebral infarction affecting unspecified side: Secondary | ICD-10-CM

## 2015-02-03 DIAGNOSIS — R4189 Other symptoms and signs involving cognitive functions and awareness: Secondary | ICD-10-CM

## 2015-02-03 DIAGNOSIS — G40109 Localization-related (focal) (partial) symptomatic epilepsy and epileptic syndromes with simple partial seizures, not intractable, without status epilepticus: Secondary | ICD-10-CM

## 2015-02-03 DIAGNOSIS — IMO0002 Reserved for concepts with insufficient information to code with codable children: Secondary | ICD-10-CM

## 2015-02-03 NOTE — Progress Notes (Signed)
NEUROLOGY FOLLOW UP OFFICE NOTE  [name] [id number]  HISTORY OF PRESENT ILLNESS: [name] is a 64 year old right-handed woman with hypertension, stroke, type II diabetes, , seizure disorder, mononeuritis of leg, OSA, thyroid nodule, cervical disc disease, depression, anxiety, chronic pain, chronic back pain with gait instability and falls, polypharmacy, and history of stroke with residual left-sided weakness and meningioma resection in September 2013 who follows up for chronic headaches, seizures, and meningioma.  Images of brain MRI reviewed.  UPDATE: Since June, she has had "spells" where she suddenly doesn't feel well.  She notes numbness in the left leg and will sometimes fall during these spells.  She had a fall in late July.  There was no loss of consciousness.  MRI of the brain performed on 02/01/15 showed a tiny area of altered signal in left pons, which location doesn't correlate with symptoms and seems more likely to be T2 shine through.  She had a recent fall in October where she sustained rib fracture.   Recent labs:  LDL 51, Hgb  A1c 6.2.   CMP from last month showed BUN 23 and Cr 1.12.  She had significantly low glucose of 36 which was treated.  HEADACHES/MIGRAINES: She has longstanding history of multifactorial chronic headaches.  They are described as from the back of her head and top of the front of her head.  It is a pounding pain, 8/10.  There is some nausea, but no photophobia or phonophobia.  She has blurred vision, but that is constant.  She usually takes Tylenol for it, but she also is taking oxycodone for left arm pain.  She has history of medication-overuse and opioids for other pain.  Prior medications included lamotrigine, gabapentin, Lyrica, metoprolol, amitriptyline, and imipramine.  She also tried trigger point injections.  She is not a candidate for triptans due to history of stroke.  Update: stable  SEIZURES: She has history of seizures for many years.   Her last seizure was about 3 years ago.  She described them as staring spells.  She is taking Lamictal 114m twice daily.  Her EEG from 08/08/12 revealed right temporal epileptiform discharges.    Update:  No seizures.  Lamictal level was canceled.   MENINGIOMA: She had a known history of meningioma.  She presented to the ED in August 2013 with sudden onset numbness of the feet and left hand weakness and numbness.  MRI of brain noted enlargement of previous planum sphenoidale meningioma compared to prior study in 2009, with vasogenic edema.  In September 2013, she underwent a bicoronal craniotomy and gross total resection.  Repeat MRI of brain with and without contrast performed on 05/01/12 revealed postop resection of meningioma of the planum sphenoidale with small amount of residual enhancing tissue along the planum sphenoidale (5 x 840m, postop blood in the bilateral frontal lobes and bilateral encephalomalacia of the gyrus rectus bilaterally.  She had a repeat MRI of the brain with and without contrast performed in May 2016, which showed no recurrence of tumor.  STROKE: She has a history of stroke going back several years ago.  Semiology is unclear but reports left-sided issues.  She was started on Plavix, but she was switched over to ASA 8112m A1c in July was 7.  LDL in May was 57  Update: Carotid doppler from 11/06/14 showed bilateral atherosclerosis with less than 50% stenosis bilaterally.  MEMORY: She also reports problems with memory, word-finding difficulties, and problems with name recognition of acquaintances  of people at church.  This has been going on for over a year.  MMSE score from 12/08/13 was 27/30 with minor inconsistencies.  Repeat testing from 07/02/14 was stable at 26/30.  PAST MEDICAL HISTORY: Past Medical History  Diagnosis Date  . Bipolar disorder ([place])   . CVA (cerebral infarction)   . Pancreatitis 2006    due to Depakote with normal EUS   . Osteoporosis   . Chronic  back pain   . Trigger finger   . Anxiety disorder   . Hypertension   . Migraines     chronic headaches  . Diverticulosis     TCS 9/08 by Dr. Delfin Edis for diarrhea . Bx for micro scopic colitis negative.   . Schatzki's ring     non critical / EGD with ED 8/2011with RMR  . S/P colonoscopy [id number], 2011    left-sided diverticula, hx of simple adenomas . 2011, random bx negative for microscopic colitis  . Glaucoma   . Allergic rhinitis   . Hypothyroidism     thyroid goiter  . Anemia   . Blood transfusion   . GERD (gastroesophageal reflux disease)   . Stroke [facility])     left sided weakness  . Seizures ([place])     unknown etiology-on meds-last seizure was 3 yerars ago  . Anxiety   . Depression   . Metabolic encephalopathy 09/07/3660  . Sleep apnea     on CPAP  . Arthritis   . Gum symptoms     infection on antibiotic  . Chronic neck pain   . Mononeuritis lower limb   . Frequent falls   . Diabetes mellitus     Type II  . Complication of anesthesia     "hard to put sleep"    MEDICATIONS: Current Outpatient Prescriptions on File Prior to Visit  Medication Sig Dispense Refill  . ACCU-CHEK FASTCLIX LANCETS MISC Use to test blood sugar 4 times daily. Dx: E10.65 408 each 1  . Alcohol Swabs (B-D SINGLE USE SWABS REGULAR) PADS Use for injections and glucose testing 8 times daily. Dx: E10.65 900 each 1  . amLODipine (NORVASC) 5 MG tablet Take 1 tablet (5 mg total) by mouth daily. 30 tablet 3  . aspirin EC 81 MG tablet Take 81 mg by mouth daily.    . Blood Glucose Calibration (ACCU-CHEK SMARTVIEW CONTROL) LIQD 1 each by Other route as needed. 1 each 2  . Blood Glucose Monitoring Suppl (ACCU-CHEK NANO SMARTVIEW) W/DEVICE KIT 1 each by Does not apply route daily. Dx: E10.65 1 kit 0  . Cholecalciferol (D 5000) 5000 UNITS capsule Take 5,000 Units by mouth daily.    . clobetasol cream (TEMOVATE) 9.47 % Apply 1 application topically daily.    . cloNIDine (CATAPRES) 0.3 MG tablet TAKE 1  TABLET BY MOUTH EVERY EIGHT HOURS. ONCE AT 8AM, 4PM, AND 12 MIDNIGHT. 90 tablet 3  . diclofenac sodium (VOLTAREN) 1 % GEL Apply 2 g topically daily as needed (Pain). 100 g 0  . dicyclomine (BENTYL) 10 MG capsule TAKE 1 CAPSULE BY M OUTH THREE TIMES DAILY BEFORE MEALS. 90 capsule 0  . docusate sodium (COLACE) 100 MG capsule Take 2 capsules (200 mg total) by mouth at bedtime. 30 capsule 0  . fluticasone (FLONASE) 50 MCG/ACT nasal spray Place 2 sprays into both nostrils daily.    [name] glucose blood (ACCU-CHEK SMARTVIEW) test strip Use to test blood sugar 4 times daily as instructed. Dx: E10.65 375 each 1  .  HYDROcodone-acetaminophen (NORCO/VICODIN) 5-325 MG tablet Take 1 tablet by mouth 2 (two) times daily. 14 tablet 0  . hydroxychloroquine (PLAQUENIL) 200 MG tablet Take 200 mg by mouth daily.    . insulin aspart (NOVOLOG FLEXPEN) 100 UNIT/ML FlexPen Inject 20-22 Units into the skin 3 (three) times daily with meals. 30 mL 1  . Insulin Glargine (TOUJEO SOLOSTAR) 300 UNIT/ML SOPN Inject 55 Units into the skin at bedtime. (Patient taking differently: Inject 50 Units into the skin at bedtime. ) 6 pen 1  . Insulin Pen Needle (CLICKFINE PEN NEEDLES) 32G X 4 MM MISC Use 4x a day 300 each 3  . lamoTRIgine (LAMICTAL) 100 MG tablet Take 150 mg by mouth daily.    [name] levothyroxine (SYNTHROID, LEVOTHROID) 50 MCG tablet Take 1 tablet (50 mcg total) by mouth daily. And 1/2 tablet on Sunday. 30 tablet 2  . LORazepam (ATIVAN) 0.5 MG tablet Take 1 tablet (0.5 mg total) by mouth 3 (three) times daily. 90 tablet 2  . losartan (COZAAR) 100 MG tablet TAKE 1 TABLET BY MOUTH ONCE DAILY. 30 tablet 2  . lubiprostone (AMITIZA) 8 MCG capsule Take 1 capsule (8 mcg total) by mouth daily after breakfast. 20 capsule 0  . methocarbamol (ROBAXIN) 500 MG tablet Take 500 mg by mouth daily.    . metoprolol (LOPRESSOR) 50 MG tablet TAKE 1 TABLET BY MOUTH TWICE DAILY. 60 tablet 6  . montelukast (SINGULAIR) 10 MG tablet TAKE ONE TABLET BY MOUTH  ONCE DAILY. 30 tablet 2  . Multiple Vitamins-Minerals (ONE-A-DAY 50 PLUS PO) Take 1 tablet by mouth daily.    [name] Nystatin (NYAMYC) 100000 UNIT/GM POWD APPLY TO AFFECTED AREA 4 TIMES DAILY. (Patient taking differently: Apply topically 4 (four) times daily. APPLY TO AFFECTED AREA 4 TIMES DAILY.) 30 g 2  . pregabalin (LYRICA) 75 MG capsule Take 75 mg by mouth 2 (two) times daily.     . promethazine (PHENERGAN) 12.5 MG tablet Take 1 tablet (12.5 mg total) by mouth every 6 (six) hours as needed for nausea or vomiting. 30 tablet 0  . RABEprazole (ACIPHEX) 20 MG tablet TAKE 1 TABLET BY MOUTH TWICE DAILY. 60 tablet 3  . RESTASIS 0.05 % ophthalmic emulsion Place 1 drop into both eyes 2 (two) times daily.     . risperiDONE (RISPERDAL) 0.5 MG tablet Take 1 tablet (0.5 mg total) by mouth at bedtime. 30 tablet 2  . rosuvastatin (CRESTOR) 5 MG tablet TAKE 1 TABLET BY MOUTH AT BEDTIME. 30 tablet 4  . sertraline (ZOLOFT) 100 MG tablet Take 2 tablets (200 mg total) by mouth daily. 60 tablet 2  . spironolactone (ALDACTONE) 25 MG tablet TAKE ONE TABLET BY MOUTH DAILY. 30 tablet 4  . traZODone (DESYREL) 150 MG tablet Take 2 tablets (300 mg total) by mouth at bedtime. 60 tablet 2  . vitamin E 400 UNIT capsule Take 400 Units by mouth daily.    [name] ZETIA 10 MG tablet TAKE ONE TABLET BY MOUTH ONCE DAILY. 30 tablet 4   No current facility-administered medications on file prior to visit.    ALLERGIES: Allergies  Allergen Reactions  . Cephalexin Hives  . Iron Nausea And Vomiting  . Milk-Related Compounds Other (See Comments)    Doesn't agree with stomach.   . Penicillins Hives  . Phenazopyridine Hcl Hives          FAMILY HISTORY: Family History  Problem Relation Age of Onset  . Heart attack Mother     HTN  . Pneumonia Father   .  Kidney failure Father   . Diabetes Father   . Pancreatic cancer Sister   . Diabetes Brother   . Hypertension Brother   . Diabetes Brother   . Colon cancer Neg Hx   . Anesthesia  problems Neg Hx   . Hypotension Neg Hx   . Malignant hyperthermia Neg Hx   . Pseudochol deficiency Neg Hx   . Stroke Maternal Grandmother   . Heart attack Maternal Grandfather   . Cancer Sister     breast   . Alcohol abuse Maternal Uncle   . Hypertension Brother     SOCIAL HISTORY: Social History   Social History  . Marital Status: Divorced    Spouse Name: N/A  . Number of Children: 1  . Years of Education: 12   Occupational History  . disabled     Social History Main Topics  . Smoking status: Former Smoker -- 0.25 packs/day for 7 years    Types: Cigarettes  . Smokeless tobacco: Never Used     Comment: quit for 5 weeks now   . Alcohol Use: No     Comment:    . Drug Use: No  . Sexual Activity: No   Other Topics Concern  . Not on file   Social History Narrative    REVIEW OF SYSTEMS: Constitutional: No fevers, chills, or sweats, no generalized fatigue, change in appetite Eyes: No visual changes, double vision, eye pain Ear, nose and throat: No hearing loss, ear pain, nasal congestion, sore throat Cardiovascular: No chest pain, palpitations Respiratory:  No shortness of breath at rest or with exertion, wheezes GastrointestinaI: No nausea, vomiting, diarrhea, abdominal pain, fecal incontinence Genitourinary:  No dysuria, urinary retention or frequency Musculoskeletal:  No neck pain, back pain Integumentary: No rash, pruritus, skin lesions Neurological: as above Psychiatric: No depression, insomnia, anxiety Endocrine: No palpitations, fatigue, diaphoresis, mood swings, change in appetite, change in weight, increased thirst Hematologic/Lymphatic:  No anemia, purpura, petechiae. Allergic/Immunologic: no itchy/runny eyes, nasal congestion, recent allergic reactions, rashes  PHYSICAL EXAM: Filed Vitals:   02/03/15 1111  BP: 136/58  Pulse: 77   General: No acute distress.   Head:  Normocephalic/atraumatic Eyes:  Fundoscopic exam unremarkable without vessel  changes, exudates, hemorrhages or papilledema. Neck: supple, no paraspinal tenderness, full range of motion Heart:  Regular rate and rhythm Lungs:  Clear to auscultation bilaterally Back: No paraspinal tenderness Neurological Exam: alert and oriented to person, place, and time. Attention span and concentration intact, recent and remote memory intact, fund of knowledge intact.  Speech slowed and dysarthric, language intact.  CN II-XII intact. Fundi not visualized. Bulk and tone normal. 5-/5 left hip flexion, otherwise 5/5.  Reduced pinprick sensation to left arm and leg. DTR 1+ throughout, except absent at ankles. Finger to nose slowed but no dysmetria. Wide-based gait.   IMPRESSION: Episodes of altered sensorium, weakness and left leg numbness, causing falls.  Unclear etiology.  She has residual left sided leg weakness from the stroke.  Consider partial seizures. Hypersomnolence, chronic Localization-related epilepsy, stable History of meningioma resection Left-sided hemiplegia as late effect of stroke Memory deficits, multifactorial, related to vascular cognitive impairment, hypersomnolence and polypharmacy  PLAN: ASA and statin (LDL at goal of less than 70) Glycemic control Lamictal 144m daily.  Will check lamictal level and likely increase dose.  AMetta Clines DO  CC:  MTula Nakayama MD

## 2015-02-03 NOTE — Patient Instructions (Signed)
Continue lamictal 150mg  daily for now We will check a lamictal level.  Likely, we will increase dose Follow up in 5 months.

## 2015-02-06 LAB — LAMOTRIGINE LEVEL: Lamotrigine Lvl: 6.5 ug/mL (ref 4.0–18.0)

## 2015-02-08 ENCOUNTER — Ambulatory Visit (HOSPITAL_COMMUNITY): Payer: Commercial Managed Care - HMO

## 2015-02-08 ENCOUNTER — Inpatient Hospital Stay (HOSPITAL_COMMUNITY): Admission: RE | Admit: 2015-02-08 | Payer: Commercial Managed Care - HMO | Source: Ambulatory Visit

## 2015-02-10 ENCOUNTER — Other Ambulatory Visit: Payer: Self-pay | Admitting: *Deleted

## 2015-02-10 DIAGNOSIS — Z9181 History of falling: Secondary | ICD-10-CM

## 2015-02-10 DIAGNOSIS — J449 Chronic obstructive pulmonary disease, unspecified: Secondary | ICD-10-CM

## 2015-02-10 NOTE — Patient Outreach (Signed)
[facility]) Care Management  02/10/2015  [name] 1950/05/30 [id number]   Referral from MD/ [employer] Tier 4 List: Telephone call to patient; HIPPA verification received.  Patient advised of reason for call & of [facility] care management services.  Subjective:  Patient states major health concerns are related to neuropathy in both legs with numbness & throbbing. States more problems in right leg. States she has diabetes but also has major back problem with nerve damage. States she has fallen without warning because " legs just give away ". Most recent fall -patient sustained "broken ribs". Voices that she uses cane, walker and/or wheelchair as needed.   Patient also voices she is on disability due to mental health condition and back condition. States she has personal care assistance for help with daily activities from Monday through Sunday for 6 hours daily.  Also states she has help with filling medication box every 2 weeks. States she administers own medications without problems. States she has community transportation available for doctors'  appointments. Voices that she has pulmonologist, endocrinologist, pain management specialist, psychiatrist.   Voices that she is trying to stop smoking & was placed on chantix  Recently.  States she does have COPD. Patient was unable to state COPD action plan for red and yellow zones. Patient voices that she has had diabetic teaching and knows how to counteract episodes of low blood sugar. States she checks blood sugar 3 times daily. States last hemoglobin A1c level was 6.2.   Patient consents to [facility] care management services.   Objective:  See health assessments as noted.   Assessment: Patient lives alone but has personal care assistant 6 hours daily.  Patient is falls risk due to health history and falls history. Patient has knowledge deficit regarding COPD action plan. Patient would benefit from [place] care coordinator services.    Plan: Refer to care management assistant to assign to community care coordinator. Telephonic signing off. Sherrin Daisy, RN BSN [place] Management Coordinator [facility] Care Management  [phone]

## 2015-02-11 ENCOUNTER — Telehealth: Payer: Self-pay | Admitting: Family Medicine

## 2015-02-11 ENCOUNTER — Other Ambulatory Visit: Payer: Self-pay | Admitting: Family Medicine

## 2015-02-11 ENCOUNTER — Ambulatory Visit (HOSPITAL_COMMUNITY): Payer: Self-pay | Admitting: Psychiatry

## 2015-02-11 MED ORDER — PROMETHAZINE-DM 6.25-15 MG/5ML PO SYRP: ORAL_SOLUTION | ORAL | Status: DC

## 2015-02-11 MED ORDER — PREDNISONE 5 MG PO TABS: 5.0000 mg | ORAL_TABLET | Freq: Two times a day (BID) | ORAL | Status: DC

## 2015-02-11 NOTE — Telephone Encounter (Signed)
Patient said to tell you thank you very much.

## 2015-02-11 NOTE — Telephone Encounter (Signed)
Sinus drainage, coughing so much her stomach is hurting denies fever, please advise

## 2015-02-11 NOTE — Telephone Encounter (Signed)
Prednisone and cough syrup sent to her pharmacy, pls let her know

## 2015-02-12 ENCOUNTER — Telehealth: Payer: Self-pay

## 2015-02-12 NOTE — Telephone Encounter (Signed)
Left message on machine for pt to return call to the office.  

## 2015-02-12 NOTE — Telephone Encounter (Signed)
-----   Message from Pieter Partridge, DO sent at 02/12/2015  7:21 AM EST ----- Corey Skains, I would like to increase her Lamictal.  However, I want to verify exactly what she is taking.  She is listed as taking Lamictal 150mg  daily.  Can we call her pharmacy to see if this is immediate release or ER?

## 2015-02-12 NOTE — Telephone Encounter (Signed)
Based on her recent Lamictal level, it appears she has been taking it.  I would check with patient to see if she has missed doses in past or is using a different pharmacy.    Nevertheless, if she is taking immediate release Lamictal at 150mg  daily, I would like to increase that to 100mg  twice daily.

## 2015-02-12 NOTE — Telephone Encounter (Signed)
Spoke with [name] pt is on immediate release. However, medication last picked up on 12/21/14. Spoke with [place] they have not dispensed medication either.

## 2015-02-12 NOTE — Telephone Encounter (Signed)
Spoke with patient. She verified that she is taking 1.5 tablets in the morning (150 mg). Told patient to increase to 100 mg BID. Pt verbalized understanding.

## 2015-02-15 ENCOUNTER — Other Ambulatory Visit: Payer: Self-pay | Admitting: Neurology

## 2015-02-15 ENCOUNTER — Other Ambulatory Visit: Payer: Self-pay

## 2015-02-15 ENCOUNTER — Other Ambulatory Visit: Payer: Self-pay | Admitting: Internal Medicine

## 2015-02-15 ENCOUNTER — Ambulatory Visit (HOSPITAL_COMMUNITY): Admission: RE | Admit: 2015-02-15 | Payer: Commercial Managed Care - HMO | Source: Ambulatory Visit

## 2015-02-15 ENCOUNTER — Telehealth: Payer: Self-pay | Admitting: Family Medicine

## 2015-02-15 ENCOUNTER — Other Ambulatory Visit: Payer: Self-pay | Admitting: Family Medicine

## 2015-02-15 DIAGNOSIS — E1065 Type 1 diabetes mellitus with hyperglycemia: Secondary | ICD-10-CM | POA: Diagnosis not present

## 2015-02-15 MED ORDER — MONTELUKAST SODIUM 10 MG PO TABS: 10.0000 mg | ORAL_TABLET | Freq: Every day | ORAL | Status: DC

## 2015-02-15 NOTE — Telephone Encounter (Signed)
Singulair refilled.   Lamictal not prescribed by this office.

## 2015-02-15 NOTE — Telephone Encounter (Signed)
Rx sent 

## 2015-02-15 NOTE — Telephone Encounter (Signed)
[name] home health nurse is asking if we could please send refills of montelukast (SINGULAIR) 10 MG tablet  And lamoTRIgine (LAMICTAL) 100 MG tablet to the pharmacy

## 2015-02-16 ENCOUNTER — Telehealth: Payer: Self-pay | Admitting: Family Medicine

## 2015-02-16 MED ORDER — BENZONATATE 100 MG PO CAPS: 100.0000 mg | ORAL_CAPSULE | Freq: Three times a day (TID) | ORAL | Status: DC

## 2015-02-16 NOTE — Telephone Encounter (Signed)
C/o excess cough and congestion, no fever always cold, sputum is thick and white

## 2015-02-17 ENCOUNTER — Telehealth: Payer: Self-pay | Admitting: Family Medicine

## 2015-02-17 DIAGNOSIS — E1065 Type 1 diabetes mellitus with hyperglycemia: Secondary | ICD-10-CM | POA: Diagnosis not present

## 2015-02-17 MED ORDER — AZITHROMYCIN 250 MG PO TABS: ORAL_TABLET | ORAL | Status: DC

## 2015-02-17 NOTE — Telephone Encounter (Signed)
Patient aware.

## 2015-02-17 NOTE — Telephone Encounter (Signed)
Patient called and spoke with md after hours.

## 2015-02-17 NOTE — Telephone Encounter (Signed)
Patient left a message on voicemail asking to speak to the nurse, she states that she is sick and has questions about her medications, please advise?

## 2015-02-17 NOTE — Telephone Encounter (Signed)
Please advise 

## 2015-02-17 NOTE — Telephone Encounter (Signed)
Z pack sent please let her know this is an antibiotic, needs OV next week if still not better  Use sugar free robitussin instead of tessalon perles to loosen congestion , or may fill $4 script at [facility] for 14 tabs

## 2015-02-17 NOTE — Telephone Encounter (Signed)
[name] is calling back stating that the cough medication that was called in is to expensive and the insurance wont pay for it, she is asking for an antibiotic to see if that will clear the cough up, please advise?

## 2015-02-21 ENCOUNTER — Emergency Department (HOSPITAL_COMMUNITY): Payer: Commercial Managed Care - HMO

## 2015-02-21 ENCOUNTER — Inpatient Hospital Stay (HOSPITAL_COMMUNITY)
Admission: EM | Admit: 2015-02-21 | Discharge: 2015-02-22 | DRG: 202 | Disposition: A | Discharge status: Home Health | Payer: Commercial Managed Care - HMO | Attending: Internal Medicine | Admitting: Internal Medicine

## 2015-02-21 ENCOUNTER — Encounter (HOSPITAL_COMMUNITY): Payer: Self-pay

## 2015-02-21 ENCOUNTER — Encounter (HOSPITAL_COMMUNITY)
Admission: EM | Disposition: A | Discharge status: Home Health | Payer: Self-pay | Source: Home / Self Care | Attending: Internal Medicine

## 2015-02-21 DIAGNOSIS — Z794 Long term (current) use of insulin: Secondary | ICD-10-CM | POA: Diagnosis not present

## 2015-02-21 DIAGNOSIS — R29898 Other symptoms and signs involving the musculoskeletal system: Secondary | ICD-10-CM

## 2015-02-21 DIAGNOSIS — F319 Bipolar disorder, unspecified: Secondary | ICD-10-CM | POA: Diagnosis present

## 2015-02-21 DIAGNOSIS — Z823 Family history of stroke: Secondary | ICD-10-CM

## 2015-02-21 DIAGNOSIS — Z7982 Long term (current) use of aspirin: Secondary | ICD-10-CM | POA: Diagnosis not present

## 2015-02-21 DIAGNOSIS — R143 Flatulence: Secondary | ICD-10-CM

## 2015-02-21 DIAGNOSIS — E875 Hyperkalemia: Secondary | ICD-10-CM | POA: Diagnosis present

## 2015-02-21 DIAGNOSIS — R0789 Other chest pain: Secondary | ICD-10-CM | POA: Diagnosis not present

## 2015-02-21 DIAGNOSIS — Z79899 Other long term (current) drug therapy: Secondary | ICD-10-CM

## 2015-02-21 DIAGNOSIS — N179 Acute kidney failure, unspecified: Secondary | ICD-10-CM

## 2015-02-21 DIAGNOSIS — E869 Volume depletion, unspecified: Secondary | ICD-10-CM | POA: Diagnosis present

## 2015-02-21 DIAGNOSIS — J440 Chronic obstructive pulmonary disease with acute lower respiratory infection: Secondary | ICD-10-CM

## 2015-02-21 DIAGNOSIS — G4733 Obstructive sleep apnea (adult) (pediatric): Secondary | ICD-10-CM

## 2015-02-21 DIAGNOSIS — Z9049 Acquired absence of other specified parts of digestive tract: Secondary | ICD-10-CM

## 2015-02-21 DIAGNOSIS — E1169 Type 2 diabetes mellitus with other specified complication: Secondary | ICD-10-CM

## 2015-02-21 DIAGNOSIS — E669 Obesity, unspecified: Secondary | ICD-10-CM | POA: Diagnosis not present

## 2015-02-21 DIAGNOSIS — E785 Hyperlipidemia, unspecified: Secondary | ICD-10-CM

## 2015-02-21 DIAGNOSIS — H409 Unspecified glaucoma: Secondary | ICD-10-CM | POA: Diagnosis present

## 2015-02-21 DIAGNOSIS — Z90710 Acquired absence of both cervix and uterus: Secondary | ICD-10-CM

## 2015-02-21 DIAGNOSIS — I69359 Hemiplegia and hemiparesis following cerebral infarction affecting unspecified side: Secondary | ICD-10-CM

## 2015-02-21 DIAGNOSIS — R42 Dizziness and giddiness: Secondary | ICD-10-CM

## 2015-02-21 DIAGNOSIS — K219 Gastro-esophageal reflux disease without esophagitis: Secondary | ICD-10-CM | POA: Diagnosis present

## 2015-02-21 DIAGNOSIS — G4700 Insomnia, unspecified: Secondary | ICD-10-CM

## 2015-02-21 DIAGNOSIS — E039 Hypothyroidism, unspecified: Secondary | ICD-10-CM | POA: Diagnosis not present

## 2015-02-21 DIAGNOSIS — G4710 Hypersomnia, unspecified: Secondary | ICD-10-CM

## 2015-02-21 DIAGNOSIS — Z833 Family history of diabetes mellitus: Secondary | ICD-10-CM

## 2015-02-21 DIAGNOSIS — J209 Acute bronchitis, unspecified: Secondary | ICD-10-CM | POA: Diagnosis not present

## 2015-02-21 DIAGNOSIS — R531 Weakness: Secondary | ICD-10-CM | POA: Diagnosis not present

## 2015-02-21 DIAGNOSIS — Z841 Family history of disorders of kidney and ureter: Secondary | ICD-10-CM | POA: Diagnosis not present

## 2015-02-21 DIAGNOSIS — M6281 Muscle weakness (generalized): Secondary | ICD-10-CM | POA: Diagnosis not present

## 2015-02-21 DIAGNOSIS — I69354 Hemiplegia and hemiparesis following cerebral infarction affecting left non-dominant side: Secondary | ICD-10-CM | POA: Diagnosis not present

## 2015-02-21 DIAGNOSIS — R202 Paresthesia of skin: Secondary | ICD-10-CM | POA: Diagnosis not present

## 2015-02-21 DIAGNOSIS — J069 Acute upper respiratory infection, unspecified: Secondary | ICD-10-CM

## 2015-02-21 DIAGNOSIS — J309 Allergic rhinitis, unspecified: Secondary | ICD-10-CM

## 2015-02-21 DIAGNOSIS — G43009 Migraine without aura, not intractable, without status migrainosus: Secondary | ICD-10-CM

## 2015-02-21 DIAGNOSIS — R079 Chest pain, unspecified: Secondary | ICD-10-CM

## 2015-02-21 DIAGNOSIS — F060 Psychotic disorder with hallucinations due to known physiological condition: Secondary | ICD-10-CM

## 2015-02-21 DIAGNOSIS — D509 Iron deficiency anemia, unspecified: Secondary | ICD-10-CM

## 2015-02-21 DIAGNOSIS — M5010 Cervical disc disorder with radiculopathy, unspecified cervical region: Secondary | ICD-10-CM

## 2015-02-21 DIAGNOSIS — G40909 Epilepsy, unspecified, not intractable, without status epilepticus: Secondary | ICD-10-CM

## 2015-02-21 DIAGNOSIS — R0781 Pleurodynia: Secondary | ICD-10-CM

## 2015-02-21 DIAGNOSIS — IMO0002 Reserved for concepts with insufficient information to code with codable children: Secondary | ICD-10-CM

## 2015-02-21 DIAGNOSIS — Z800 Family history of malignant neoplasm of digestive organs: Secondary | ICD-10-CM | POA: Diagnosis not present

## 2015-02-21 DIAGNOSIS — I10 Essential (primary) hypertension: Secondary | ICD-10-CM

## 2015-02-21 DIAGNOSIS — R911 Solitary pulmonary nodule: Secondary | ICD-10-CM

## 2015-02-21 DIAGNOSIS — Z0000 Encounter for general adult medical examination without abnormal findings: Secondary | ICD-10-CM

## 2015-02-21 DIAGNOSIS — E118 Type 2 diabetes mellitus with unspecified complications: Secondary | ICD-10-CM | POA: Diagnosis not present

## 2015-02-21 DIAGNOSIS — F39 Unspecified mood [affective] disorder: Secondary | ICD-10-CM | POA: Diagnosis present

## 2015-02-21 DIAGNOSIS — J9811 Atelectasis: Secondary | ICD-10-CM | POA: Diagnosis not present

## 2015-02-21 DIAGNOSIS — J449 Chronic obstructive pulmonary disease, unspecified: Secondary | ICD-10-CM

## 2015-02-21 DIAGNOSIS — R296 Repeated falls: Secondary | ICD-10-CM

## 2015-02-21 DIAGNOSIS — Z91011 Allergy to milk products, unspecified: Secondary | ICD-10-CM

## 2015-02-21 DIAGNOSIS — M810 Age-related osteoporosis without current pathological fracture: Secondary | ICD-10-CM

## 2015-02-21 DIAGNOSIS — E119 Type 2 diabetes mellitus without complications: Secondary | ICD-10-CM

## 2015-02-21 DIAGNOSIS — Z8249 Family history of ischemic heart disease and other diseases of the circulatory system: Secondary | ICD-10-CM

## 2015-02-21 DIAGNOSIS — J4489 Other specified chronic obstructive pulmonary disease: Secondary | ICD-10-CM

## 2015-02-21 DIAGNOSIS — G5601 Carpal tunnel syndrome, right upper limb: Secondary | ICD-10-CM

## 2015-02-21 DIAGNOSIS — D329 Benign neoplasm of meninges, unspecified: Secondary | ICD-10-CM

## 2015-02-21 DIAGNOSIS — F17210 Nicotine dependence, cigarettes, uncomplicated: Secondary | ICD-10-CM | POA: Diagnosis not present

## 2015-02-21 DIAGNOSIS — K3184 Gastroparesis: Secondary | ICD-10-CM

## 2015-02-21 DIAGNOSIS — I272 Pulmonary hypertension, unspecified: Secondary | ICD-10-CM | POA: Diagnosis present

## 2015-02-21 DIAGNOSIS — I6789 Other cerebrovascular disease: Secondary | ICD-10-CM | POA: Diagnosis not present

## 2015-02-21 HISTORY — DX: Reserved for concepts with insufficient information to code with codable children: IMO0002

## 2015-02-21 HISTORY — DX: Cervical disc disorder with radiculopathy, unspecified cervical region: M50.10

## 2015-02-21 HISTORY — DX: Chronic obstructive pulmonary disease, unspecified: J44.9

## 2015-02-21 HISTORY — DX: Carpal tunnel syndrome, right upper limb: G56.01

## 2015-02-21 LAB — PROTIME-INR
INR: 1.14 (ref 0.00–1.49)
PROTHROMBIN TIME: 14.8 s (ref 11.6–15.2)

## 2015-02-21 LAB — I-STAT TROPONIN, ED
TROPONIN I, POC: 0.01 ng/mL (ref 0.00–0.08)
Troponin i, poc: 0 ng/mL (ref 0.00–0.08)

## 2015-02-21 LAB — TROPONIN I
Troponin I: <0.03 ng/mL (ref <0.031)
Troponin I: <0.03 ng/mL (ref <0.031)
Troponin I: <0.03 ng/mL (ref <0.031)

## 2015-02-21 LAB — I-STAT CHEM 8, ED
BUN: 32 mg/dL — ABNORMAL HIGH (ref 6–20)
CALCIUM ION: 1.29 mmol/L (ref 1.13–1.30)
CHLORIDE: 105 mmol/L (ref 101–111)
CREATININE: 1.5 mg/dL — AB (ref 0.44–1.00)
GLUCOSE: 124 mg/dL — AB (ref 65–99)
HCT: 33 % — ABNORMAL LOW (ref 36.0–46.0)
HEMOGLOBIN: 11.2 g/dL — AB (ref 12.0–15.0)
POTASSIUM: 4.9 mmol/L (ref 3.5–5.1)
Sodium: 140 mmol/L (ref 135–145)
TCO2: 24 mmol/L (ref 0–100)

## 2015-02-21 LAB — CBC
HEMATOCRIT: 31.2 % — AB (ref 36.0–46.0)
HEMOGLOBIN: 9.4 g/dL — AB (ref 12.0–15.0)
MCH: 22.8 pg — AB (ref 26.0–34.0)
MCHC: 30.1 g/dL (ref 30.0–36.0)
MCV: 75.7 fL — AB (ref 78.0–100.0)
Platelets: 201 10*3/uL (ref 150–400)
RBC: 4.12 MIL/uL (ref 3.87–5.11)
RDW: 14.8 % (ref 11.5–15.5)
WBC: 7.3 10*3/uL (ref 4.0–10.5)

## 2015-02-21 LAB — COMPREHENSIVE METABOLIC PANEL
ALK PHOS: 90 U/L (ref 38–126)
ALT: 25 U/L (ref 14–54)
ANION GAP: 5 (ref 5–15)
AST: 24 U/L (ref 15–41)
Albumin: 3.5 g/dL (ref 3.5–5.0)
BILIRUBIN TOTAL: 0.3 mg/dL (ref 0.3–1.2)
BUN: 34 mg/dL — ABNORMAL HIGH (ref 6–20)
CALCIUM: 9 mg/dL (ref 8.9–10.3)
CO2: 25 mmol/L (ref 22–32)
Chloride: 106 mmol/L (ref 101–111)
Creatinine, Ser: 1.49 mg/dL — ABNORMAL HIGH (ref 0.44–1.00)
GFR, EST AFRICAN AMERICAN: 42 mL/min — AB (ref >60)
GFR, EST NON AFRICAN AMERICAN: 36 mL/min — AB (ref >60)
GLUCOSE: 132 mg/dL — AB (ref 65–99)
POTASSIUM: 4.8 mmol/L (ref 3.5–5.1)
Sodium: 136 mmol/L (ref 135–145)
TOTAL PROTEIN: 6.7 g/dL (ref 6.5–8.1)

## 2015-02-21 LAB — LIPID PANEL
CHOL/HDL RATIO: 3.1 ratio
Cholesterol: 96 mg/dL (ref 0–200)
HDL: 31 mg/dL — ABNORMAL LOW (ref >40)
LDL Cholesterol: 35 mg/dL (ref 0–99)
Triglycerides: 150 mg/dL — ABNORMAL HIGH (ref <150)
VLDL: 30 mg/dL (ref 0–40)

## 2015-02-21 LAB — TSH: TSH: 1.207 u[IU]/mL (ref 0.350–4.500)

## 2015-02-21 LAB — MAGNESIUM: Magnesium: 2 mg/dL (ref 1.7–2.4)

## 2015-02-21 LAB — APTT: APTT: 25 s (ref 24–37)

## 2015-02-21 SURGERY — INVASIVE LAB ABORTED CASE

## 2015-02-21 MED ORDER — DOCUSATE SODIUM 100 MG PO CAPS
200.0000 mg | ORAL_CAPSULE | Freq: Every day | ORAL | Status: DC
  Administered 2015-02-21: 200 mg via ORAL
  Filled 2015-02-21 (×2): qty 2

## 2015-02-21 MED ORDER — LORAZEPAM 0.5 MG PO TABS
0.5000 mg | ORAL_TABLET | Freq: Three times a day (TID) | ORAL | Status: DC
  Administered 2015-02-21 – 2015-02-22 (×3): 0.5 mg via ORAL
  Filled 2015-02-21 (×3): qty 1

## 2015-02-21 MED ORDER — PANTOPRAZOLE SODIUM 40 MG PO TBEC
40.0000 mg | DELAYED_RELEASE_TABLET | Freq: Every day | ORAL | Status: DC
  Administered 2015-02-21 – 2015-02-22 (×2): 40 mg via ORAL
  Filled 2015-02-21 (×2): qty 1

## 2015-02-21 MED ORDER — INSULIN ASPART 100 UNIT/ML ~~LOC~~ SOLN: 0.0000 [IU] | Freq: Every day | SUBCUTANEOUS | Status: DC

## 2015-02-21 MED ORDER — HEPARIN SODIUM (PORCINE) 5000 UNIT/ML IJ SOLN
4000.0000 [IU] | INTRAMUSCULAR | Status: DC
  Filled 2015-02-21: qty 1

## 2015-02-21 MED ORDER — SERTRALINE HCL 50 MG PO TABS
200.0000 mg | ORAL_TABLET | Freq: Every day | ORAL | Status: DC
  Administered 2015-02-21 – 2015-02-22 (×2): 200 mg via ORAL
  Filled 2015-02-21 (×2): qty 4

## 2015-02-21 MED ORDER — METOPROLOL TARTRATE 50 MG PO TABS
50.0000 mg | ORAL_TABLET | Freq: Two times a day (BID) | ORAL | Status: DC
  Administered 2015-02-22: 50 mg via ORAL
  Filled 2015-02-21: qty 1

## 2015-02-21 MED ORDER — POTASSIUM CHLORIDE IN NACL 20-0.9 MEQ/L-% IV SOLN: INTRAVENOUS | Status: DC

## 2015-02-21 MED ORDER — LEVOTHYROXINE SODIUM 25 MCG PO TABS: 25.0000 ug | ORAL_TABLET | Freq: Every day | ORAL | Status: DC

## 2015-02-21 MED ORDER — INSULIN ASPART 100 UNIT/ML FLEXPEN
20.0000 [IU] | PEN_INJECTOR | Freq: Three times a day (TID) | SUBCUTANEOUS | Status: DC
  Filled 2015-02-21: qty 3

## 2015-02-21 MED ORDER — FLUTICASONE PROPIONATE 50 MCG/ACT NA SUSP
2.0000 | Freq: Every day | NASAL | Status: DC
  Administered 2015-02-22: 2 via NASAL
  Filled 2015-02-21 (×2): qty 16

## 2015-02-21 MED ORDER — HYDROXYCHLOROQUINE SULFATE 200 MG PO TABS
200.0000 mg | ORAL_TABLET | Freq: Every day | ORAL | Status: DC
  Administered 2015-02-22: 200 mg via ORAL
  Filled 2015-02-21 (×4): qty 1

## 2015-02-21 MED ORDER — LOSARTAN POTASSIUM 50 MG PO TABS
100.0000 mg | ORAL_TABLET | Freq: Every day | ORAL | Status: DC
  Filled 2015-02-21: qty 2

## 2015-02-21 MED ORDER — PREGABALIN 75 MG PO CAPS
75.0000 mg | ORAL_CAPSULE | Freq: Two times a day (BID) | ORAL | Status: DC
  Administered 2015-02-21 – 2015-02-22 (×2): 75 mg via ORAL
  Filled 2015-02-21 (×2): qty 1

## 2015-02-21 MED ORDER — DICLOFENAC SODIUM 1 % TD GEL
2.0000 g | Freq: Every day | TRANSDERMAL | Status: DC | PRN
  Filled 2015-02-21: qty 100

## 2015-02-21 MED ORDER — METHYLPREDNISOLONE SODIUM SUCC 40 MG IJ SOLR
40.0000 mg | Freq: Three times a day (TID) | INTRAMUSCULAR | Status: DC
  Administered 2015-02-21 – 2015-02-22 (×3): 40 mg via INTRAVENOUS
  Filled 2015-02-21 (×3): qty 1

## 2015-02-21 MED ORDER — CYCLOSPORINE 0.05 % OP EMUL
1.0000 [drp] | Freq: Two times a day (BID) | OPHTHALMIC | Status: DC
  Administered 2015-02-22: 1 [drp] via OPHTHALMIC
  Filled 2015-02-21 (×6): qty 1

## 2015-02-21 MED ORDER — CLONIDINE HCL 0.2 MG PO TABS: 0.3000 mg | ORAL_TABLET | Freq: Three times a day (TID) | ORAL | Status: DC

## 2015-02-21 MED ORDER — ACETAMINOPHEN 325 MG PO TABS: 650.0000 mg | ORAL_TABLET | Freq: Four times a day (QID) | ORAL | Status: DC | PRN

## 2015-02-21 MED ORDER — ASPIRIN 81 MG PO CHEW
324.0000 mg | CHEWABLE_TABLET | Freq: Once | ORAL | Status: DC
  Filled 2015-02-21: qty 4

## 2015-02-21 MED ORDER — METOPROLOL TARTRATE 50 MG PO TABS: 50.0000 mg | ORAL_TABLET | Freq: Two times a day (BID) | ORAL | Status: DC

## 2015-02-21 MED ORDER — ONDANSETRON HCL 4 MG/2ML IJ SOLN: 4.0000 mg | Freq: Four times a day (QID) | INTRAMUSCULAR | Status: DC | PRN

## 2015-02-21 MED ORDER — GUAIFENESIN-DM 100-10 MG/5ML PO SYRP: 5.0000 mL | ORAL_SOLUTION | ORAL | Status: DC | PRN

## 2015-02-21 MED ORDER — HYDROCODONE-ACETAMINOPHEN 5-325 MG PO TABS: 1.0000 | ORAL_TABLET | ORAL | Status: DC | PRN

## 2015-02-21 MED ORDER — ASPIRIN EC 81 MG PO TBEC
81.0000 mg | DELAYED_RELEASE_TABLET | Freq: Every day | ORAL | Status: DC
  Administered 2015-02-22: 81 mg via ORAL
  Filled 2015-02-21: qty 1

## 2015-02-21 MED ORDER — ASPIRIN 300 MG RE SUPP
RECTAL | Status: AC
  Administered 2015-02-21: 300 mg
  Filled 2015-02-21: qty 1

## 2015-02-21 MED ORDER — MONTELUKAST SODIUM 10 MG PO TABS
10.0000 mg | ORAL_TABLET | Freq: Every day | ORAL | Status: DC
  Administered 2015-02-21 – 2015-02-22 (×2): 10 mg via ORAL
  Filled 2015-02-21 (×2): qty 1

## 2015-02-21 MED ORDER — RISPERIDONE 0.5 MG PO TABS
0.5000 mg | ORAL_TABLET | Freq: Every day | ORAL | Status: DC
  Administered 2015-02-21: 0.5 mg via ORAL
  Filled 2015-02-21 (×2): qty 1

## 2015-02-21 MED ORDER — LUBIPROSTONE 8 MCG PO CAPS
8.0000 ug | ORAL_CAPSULE | Freq: Every day | ORAL | Status: DC
  Administered 2015-02-22: 8 ug via ORAL
  Filled 2015-02-21 (×4): qty 1

## 2015-02-21 MED ORDER — LAMOTRIGINE 100 MG PO TABS
100.0000 mg | ORAL_TABLET | Freq: Two times a day (BID) | ORAL | Status: DC
  Administered 2015-02-21 – 2015-02-22 (×2): 100 mg via ORAL
  Filled 2015-02-21 (×4): qty 1

## 2015-02-21 MED ORDER — TRAZODONE HCL 50 MG PO TABS
300.0000 mg | ORAL_TABLET | Freq: Every day | ORAL | Status: DC
  Administered 2015-02-21: 300 mg via ORAL
  Filled 2015-02-21: qty 6

## 2015-02-21 MED ORDER — INSULIN ASPART 100 UNIT/ML ~~LOC~~ SOLN
0.0000 [IU] | Freq: Three times a day (TID) | SUBCUTANEOUS | Status: DC
  Administered 2015-02-22: 3 [IU] via SUBCUTANEOUS
  Administered 2015-02-22: 5 [IU] via SUBCUTANEOUS
  Administered 2015-02-22: 3 [IU] via SUBCUTANEOUS

## 2015-02-21 MED ORDER — ALBUTEROL SULFATE (2.5 MG/3ML) 0.083% IN NEBU
2.5000 mg | INHALATION_SOLUTION | Freq: Four times a day (QID) | RESPIRATORY_TRACT | Status: DC
  Administered 2015-02-21 – 2015-02-22 (×2): 2.5 mg via RESPIRATORY_TRACT
  Filled 2015-02-21 (×2): qty 3

## 2015-02-21 MED ORDER — INSULIN GLARGINE 300 UNIT/ML ~~LOC~~ SOPN: 30.0000 [IU] | PEN_INJECTOR | Freq: Every day | SUBCUTANEOUS | Status: DC

## 2015-02-21 MED ORDER — ONDANSETRON HCL 4 MG PO TABS: 4.0000 mg | ORAL_TABLET | Freq: Four times a day (QID) | ORAL | Status: DC | PRN

## 2015-02-21 MED ORDER — SODIUM CHLORIDE 0.9 % IV SOLN
INTRAVENOUS | Status: DC
  Administered 2015-02-21: 20 mL/h via INTRAVENOUS

## 2015-02-21 MED ORDER — DEXTROSE 5 % IV SOLN
INTRAVENOUS | Status: AC
  Filled 2015-02-21: qty 500

## 2015-02-21 MED ORDER — LOSARTAN POTASSIUM 50 MG PO TABS: 100.0000 mg | ORAL_TABLET | Freq: Every day | ORAL | Status: DC

## 2015-02-21 MED ORDER — SPIRONOLACTONE 25 MG PO TABS
25.0000 mg | ORAL_TABLET | Freq: Every day | ORAL | Status: DC
  Filled 2015-02-21: qty 1

## 2015-02-21 MED ORDER — ENOXAPARIN SODIUM 30 MG/0.3ML ~~LOC~~ SOLN
30.0000 mg | SUBCUTANEOUS | Status: DC
  Administered 2015-02-21: 30 mg via SUBCUTANEOUS
  Filled 2015-02-21: qty 0.3

## 2015-02-21 MED ORDER — ROSUVASTATIN CALCIUM 10 MG PO TABS
5.0000 mg | ORAL_TABLET | Freq: Every day | ORAL | Status: DC
  Administered 2015-02-21: 5 mg via ORAL
  Filled 2015-02-21 (×2): qty 1

## 2015-02-21 MED ORDER — INSULIN GLARGINE 100 UNIT/ML ~~LOC~~ SOLN
30.0000 [IU] | Freq: Every day | SUBCUTANEOUS | Status: DC
  Administered 2015-02-21: 30 [IU] via SUBCUTANEOUS
  Filled 2015-02-21 (×2): qty 0.3

## 2015-02-21 MED ORDER — DEXTROSE 5 % IV SOLN
500.0000 mg | INTRAVENOUS | Status: DC
  Administered 2015-02-21: 500 mg via INTRAVENOUS
  Filled 2015-02-21: qty 500

## 2015-02-21 MED ORDER — LOSARTAN POTASSIUM 50 MG PO TABS
100.0000 mg | ORAL_TABLET | Freq: Every day | ORAL | Status: DC
  Filled 2015-02-21 (×2): qty 2

## 2015-02-21 MED ORDER — EZETIMIBE 10 MG PO TABS
10.0000 mg | ORAL_TABLET | Freq: Every day | ORAL | Status: DC
  Administered 2015-02-21 – 2015-02-22 (×2): 10 mg via ORAL
  Filled 2015-02-21 (×4): qty 1

## 2015-02-21 MED ORDER — CLONIDINE HCL 0.1 MG PO TABS
0.1000 mg | ORAL_TABLET | Freq: Three times a day (TID) | ORAL | Status: DC
  Administered 2015-02-21 – 2015-02-22 (×3): 0.1 mg via ORAL
  Filled 2015-02-21 (×3): qty 1

## 2015-02-21 MED ORDER — LEVOTHYROXINE SODIUM 25 MCG PO TABS
25.0000 ug | ORAL_TABLET | Freq: Every day | ORAL | Status: DC
  Administered 2015-02-21 – 2015-02-22 (×2): 25 ug via ORAL
  Filled 2015-02-21 (×2): qty 1

## 2015-02-21 MED ORDER — BENZONATATE 100 MG PO CAPS
100.0000 mg | ORAL_CAPSULE | Freq: Three times a day (TID) | ORAL | Status: DC
  Administered 2015-02-21 – 2015-02-22 (×4): 100 mg via ORAL
  Filled 2015-02-21 (×4): qty 1

## 2015-02-21 MED ORDER — METHOCARBAMOL 500 MG PO TABS
500.0000 mg | ORAL_TABLET | Freq: Every day | ORAL | Status: DC
  Administered 2015-02-21 – 2015-02-22 (×2): 500 mg via ORAL
  Filled 2015-02-21 (×2): qty 1

## 2015-02-21 MED ORDER — ALUM & MAG HYDROXIDE-SIMETH 200-200-20 MG/5ML PO SUSP: 30.0000 mL | Freq: Four times a day (QID) | ORAL | Status: DC | PRN

## 2015-02-21 MED ORDER — ACETAMINOPHEN 650 MG RE SUPP: 650.0000 mg | Freq: Four times a day (QID) | RECTAL | Status: DC | PRN

## 2015-02-21 MED ORDER — SODIUM CHLORIDE 0.9 % IV SOLN
INTRAVENOUS | Status: DC
  Administered 2015-02-21 – 2015-02-22 (×2): via INTRAVENOUS

## 2015-02-21 MED ORDER — AMLODIPINE BESYLATE 5 MG PO TABS: 5.0000 mg | ORAL_TABLET | Freq: Every day | ORAL | Status: DC

## 2015-02-21 SURGICAL SUPPLY — 10 items
CATH INFINITI 5FR ANG PIGTAIL (CATHETERS) ×1 IMPLANT
CATH OPTITORQUE JACKY 4.0 5F (CATHETERS) ×1 IMPLANT
DEVICE RAD COMP TR BAND LRG (VASCULAR PRODUCTS) ×1 IMPLANT
GLIDESHEATH SLEND SS 6F .021 (SHEATH) ×1 IMPLANT
KIT HEART LEFT (KITS) ×1 IMPLANT
PACK CARDIAC CATHETERIZATION (CUSTOM PROCEDURE TRAY) ×1 IMPLANT
SYR MEDRAD MARK V 150ML (SYRINGE) ×1 IMPLANT
TRANSDUCER W/STOPCOCK (MISCELLANEOUS) ×1 IMPLANT
TUBING CIL FLEX 10 FLL-RA (TUBING) ×1 IMPLANT
WIRE SAFE-T 1.5MM-J .035X260CM (WIRE) ×1 IMPLANT

## 2015-02-21 NOTE — ED Notes (Signed)
Doing swallow evaluation .  Pt unable to stay awake for evaluation.  Dr. Rogene Houston notified.  Okay to give aspirin rectally.

## 2015-02-21 NOTE — ED Notes (Addendum)
Pt says woke up around 0730 with increased weakness in left hand.  Reports history of stroke with residual weakness in left arm but this morning was worse.  Pt also c/o tightness in chest.  BP 95/50 with EMS.  Pt also reports cold symptoms x 2 weeks and is currently on antibiotics.  Reports was on prednisone recently.

## 2015-02-21 NOTE — H&P (Addendum)
[facility] History and Physical  [name] ZOX:[id number] DOB: [birth year] DOA: 02/21/2015  Referring physician: ED MD Dr. Rogene Houston PCP: Tula Nakayama, MD   Chief Complaint:   HPI: [name] is a 64 y.o. female with multiple medical conditions including previous CVA with chronic left-sided hemiparesis, cervical radiculopathy, bipolar disorder with psychotic features, chronic bronchitis, obstructive sleep apnea, seizure disorder, and diabetes mellitus. She presents with a chief complaint of central chest tightness and left upper extremity weakness. Her symptoms started early yesterday. She describes the chest pain as tightness. It was initially 7/10 in intensity. It starts in her central chest and then radiates to the left. It is not associated with diaphoresis or nausea. It is associated with shortness of breath, cough, and wheezing. URI symptoms started last week. She has had subjective chills, but no fever. She has had a nonproductive cough. Her left arm and leg are chronically weak from her previous stroke. However, yesterday afternoon, she noticed more weakness in her left hand. She is unable to extend her left hand. She denies difficulty swallowing, left leg weakness, trouble understanding, or difficulty speaking. She denies a headache. Of note, she was recently started on azithromycin, cough medication, and a prednisone taper by her PCP, [name] for URI symptoms.  In the ED, she is afebrile and hemodynamically stable, though her heart rate ranges from 55-59. She is oxygenating 100% on nasal cannula oxygen. Her chest x-ray reveals minimal bibasal atelectasis. Her head CT reveals chronic changes in the frontal lobes consistent with a prior surgical resection, but no acute abnormalities. Her lab data are significant for a BUN of 32, creatinine 1.50, glucose of 124, hemoglobin of 11.2, and normal troponin I. She is being admitted for further evaluation and  management.     Review of Systems:  Review of systems as above in history present illness. In addition, she has chronic pain in her back and legs; sometimes she gets anxious; she has chronic weakness on her left side. Otherwise review of systems is negative.  Past Medical History  Diagnosis Date  . Bipolar disorder ([place])   . CVA (cerebral infarction)   . Pancreatitis 2006    due to Depakote with normal EUS   . Osteoporosis   . Chronic back pain   . Trigger finger   . Anxiety disorder   . Hypertension   . Migraines     chronic headaches  . Diverticulosis     TCS 9/08 by Dr. Delfin Edis for diarrhea . Bx for micro scopic colitis negative.   . Schatzki's ring     non critical / EGD with ED 8/2011with RMR  . S/P colonoscopy 8119,1478, 2011    left-sided diverticula, hx of simple adenomas . 2011, random bx negative for microscopic colitis  . Glaucoma   . Allergic rhinitis   . Hypothyroidism     thyroid goiter  . Anemia   . Blood transfusion   . GERD (gastroesophageal reflux disease)   . Stroke [facility])     left sided weakness  . Seizures ([place])     unknown etiology-on meds-last seizure was 3 yerars ago  . Anxiety   . Depression   . Metabolic encephalopathy 2/95/6213  . Sleep apnea     on CPAP  . Arthritis   . Gum symptoms     infection on antibiotic  . Chronic neck pain   . Mononeuritis lower limb   . Frequent falls   . Diabetes mellitus  Type II  . Complication of anesthesia     "hard to put sleep"  . Cervical disc disorder with radiculopathy of cervical region 10/31/2012  . COPD (chronic obstructive pulmonary disease) with chronic bronchitis ([place]) 09/16/2013    Office Spirometry 10/30/2013-submaximal effort based on appearance of loop and curve. Numbers would fit with severe restriction but her physiologic capability may be better than this. FVC 0.91/44%, and 10.74/45%, FEV1/FVC 0.81, FEF 25-75% 1.43/69%    . Carpal tunnel syndrome of right wrist 05/23/2011  .  Hemiplegia affecting non-dominant side, post-stroke ([place]) 08/02/2011   Past Surgical History  Procedure Laterality Date  . Abdominal hysterectomy  1978  . Cholecystectomy  1984  . Ovarian cyst removal    . Carpal tunnel release Left 07/22/04    Dr. Aline Brochure  . Breast reduction surgery  1994  . Cataract extraction Bilateral   . Biopsy thyroid  2009  . Surgical excision of 3 tumors from right thigh and right buttock  and left upper thigh  2010  . Back surgery  July 2012  . Spine surgery  09/29/2010    Dr. Rolena Infante  . Maloney dilation  12/29/2010    RMR;  . Esophagogastroduodenoscopy  12/29/2010    Rourk-Retained food in the esophagus and stomach, small hiatal hernia, status post Maloney dilation of the esophagus  . Craniotomy  11/23/2011    Procedure: CRANIOTOMY TUMOR EXCISION;  Surgeon: Hosie Spangle, MD;  Location: [place] [facility];  Service: Neurosurgery;  Laterality: N/A;  Craniotomy for tumor resection  . Brain surgery  11/2011    resection of meningioma  . Colonoscopy N/A 09/25/2012    NOM:VEHMCNO diverticulosis.  colonic polyp-removed : tubular adenoma  . Esophagogastroduodenoscopy N/A 09/25/2012    BSJ:GGEZMOQH atonic baggy esophagus status post Maloney dilation 89 F. Hiatal hernia  . Givens capsule study N/A 01/15/2013    NORMAL.   . Bacterial overgrowth test N/A 05/05/2013    Procedure: BACTERIAL OVERGROWTH TEST;  Surgeon: Daneil Dolin, MD;  [facility];  Service: Endoscopy;  Laterality: N/A;  7:30  . Transthoracic echocardiogram  2010    EF 60-65%, mild conc LVH, grade 1 diastolic dysfunction; mildly calcified MV annulus with mildly thickened leaflets, mildly calcified MR annulus  . Nm myocar perf wall motion  2006    "relavtiely normal" persantine, mild anterior thinning (breast attenuation artifact), no region of scar/ischemia  . Cardiac catheterization  05/10/2005    normal coronaries, normal LV systolic function and EF (Dr. Jackie Plum)  . Tooth extraction  Bilateral 12/14/2014    Procedure: REMOVAL OF BILATERAL MANDIBULAR EXOSTOSES;  Surgeon: Diona Browner, DDS;  Location: [place];  Service: Oral Surgery;  Laterality: Bilateral;   Social History: She is divorced. She has one son. She is disabled. She decreased her smoking to 2 cigarettes daily. She denies alcohol and illicit drug use. She relates chronically with a cane and walker. She has a sitter/home health aide 6 hours 5 days weekly.  Allergies  Allergen Reactions  . Cephalexin Hives  . Iron Nausea And Vomiting  . Milk-Related Compounds Other (See Comments)    Doesn't agree with stomach.   . Penicillins Hives    Has patient had a PCN reaction causing immediate rash, facial/tongue/throat swelling, SOB or lightheadedness with hypotension: Yes Has patient had a PCN reaction causing severe rash involving mucus membranes or skin necrosis: No Has patient had a PCN reaction that required hospitalization No Has patient had a PCN reaction occurring within the last  10 years: No If all of the above answers are "NO", then may proceed with Cephalosporin use.   [name] Phenazopyridine Hcl Hives          Family History  Problem Relation Age of Onset  . Heart attack Mother     HTN  . Pneumonia Father   . Kidney failure Father   . Diabetes Father   . Pancreatic cancer Sister   . Diabetes Brother   . Hypertension Brother   . Diabetes Brother   . Colon cancer Neg Hx   . Anesthesia problems Neg Hx   . Hypotension Neg Hx   . Malignant hyperthermia Neg Hx   . Pseudochol deficiency Neg Hx   . Stroke Maternal Grandmother   . Heart attack Maternal Grandfather   . Cancer Sister     breast   . Alcohol abuse Maternal Uncle   . Hypertension Brother     Prior to Admission medications   Medication Sig Start Date End Date Taking? Authorizing Provider  amLODipine (NORVASC) 5 MG tablet Take 1 tablet (5 mg total) by mouth daily. 12/30/14  Yes [name], MD  aspirin EC 81 MG tablet Take 81 mg by mouth  daily.   Yes Historical Provider, MD  azithromycin (ZITHROMAX) 250 MG tablet Two tablets on day one, then one daily for an additional four days Patient taking differently: Take 250-500 mg by mouth See admin instructions. Two tablets on day one, then one daily for an additional four days 02/17/15  Yes [name], MD  benzonatate (TESSALON) 100 MG capsule Take 1 capsule (100 mg total) by mouth 3 (three) times daily. 02/16/15  Yes [name], MD  Cholecalciferol (D 5000) 5000 UNITS capsule Take 5,000 Units by mouth daily.   Yes Historical Provider, MD  clobetasol cream (TEMOVATE) 4.58 % Apply 1 application topically daily.   Yes Historical Provider, MD  cloNIDine (CATAPRES) 0.3 MG tablet TAKE 1 TABLET BY MOUTH EVERY EIGHT HOURS. ONCE AT 8AM, 4PM, AND 12 MIDNIGHT. 02/01/15  Yes [name], MD  diclofenac sodium (VOLTAREN) 1 % GEL Apply 2 g topically daily as needed (Pain). 11/20/14  Yes [name], MD  dicyclomine (BENTYL) 10 MG capsule TAKE 1 CAPSULE BY M OUTH THREE TIMES DAILY BEFORE MEALS. 01/11/15  Yes [name], MD  docusate sodium (COLACE) 100 MG capsule Take 2 capsules (200 mg total) by mouth at bedtime. 06/07/14  Yes Kathie Dike, MD  fluticasone (FLONASE) 50 MCG/ACT nasal spray Place 2 sprays into both nostrils daily.   Yes Historical Provider, MD  HYDROcodone-acetaminophen (NORCO/VICODIN) 5-325 MG tablet Take 1 tablet by mouth 2 (two) times daily. 12/30/14  Yes [name], MD  hydroxychloroquine (PLAQUENIL) 200 MG tablet Take 200 mg by mouth daily.   Yes Historical Provider, MD  insulin aspart (NOVOLOG FLEXPEN) 100 UNIT/ML FlexPen Inject 20-22 Units into the skin 3 (three) times daily with meals. 09/21/14  Yes Philemon Kingdom, MD  Insulin Glargine (TOUJEO SOLOSTAR) 300 UNIT/ML SOPN Inject 55 Units into the skin at bedtime. Patient taking differently: Inject 50 Units into the skin at bedtime.  09/21/14  Yes Philemon Kingdom, MD  lamoTRIgine (LAMICTAL)  100 MG tablet Take 1 tablet (100 mg total) by mouth 2 (two) times daily. 02/15/15  Yes Adam Telford Nab, DO  levothyroxine (SYNTHROID, LEVOTHROID) 50 MCG tablet TAKE 1 TABLET BY MOUTH ONCE DAILY AND 1/2 TABLET ON SUNDAYS. Patient taking differently: Take 25-50 mcg by mouth daily. TAKE 1 TABLET  BY MOUTH ONCE DAILY AND 1/2 TABLET ON SUNDAYS. 02/15/15  Yes Philemon Kingdom, MD  LORazepam (ATIVAN) 0.5 MG tablet Take 1 tablet (0.5 mg total) by mouth 3 (three) times daily. 02/02/15  Yes Cloria Spring, MD  losartan (COZAAR) 100 MG tablet TAKE 1 TABLET BY MOUTH ONCE DAILY. 01/21/15  Yes [name], MD  lubiprostone (AMITIZA) 8 MCG capsule Take 1 capsule (8 mcg total) by mouth daily after breakfast. 11/05/14  Yes Mahala Menghini, PA-C  methocarbamol (ROBAXIN) 500 MG tablet Take 500 mg by mouth daily. 12/21/14  Yes Historical Provider, MD  metoprolol (LOPRESSOR) 50 MG tablet TAKE 1 TABLET BY MOUTH TWICE DAILY. 09/18/14  Yes Pixie Casino, MD  montelukast (SINGULAIR) 10 MG tablet Take 1 tablet (10 mg total) by mouth daily. 02/15/15  Yes [name], MD  Multiple Vitamins-Minerals (ONE-A-DAY 50 PLUS PO) Take 1 tablet by mouth daily.   Yes Historical Provider, MD  Nystatin ([place]) 100000 UNIT/GM POWD APPLY TO AFFECTED AREA 4 TIMES DAILY. Patient taking differently: Apply topically 4 (four) times daily. APPLY TO AFFECTED AREA 4 TIMES DAILY. 10/20/14  Yes [name], MD  pregabalin (LYRICA) 75 MG capsule Take 75 mg by mouth 2 (two) times daily.  10/26/11  Yes Carole Civil, MD  promethazine (PHENERGAN) 12.5 MG tablet Take 1 tablet (12.5 mg total) by mouth every 6 (six) hours as needed for nausea or vomiting. 08/20/14  Yes Mahala Menghini, PA-C  promethazine-dextromethorphan (PROMETHAZINE-DM) 6.25-15 MG/5ML syrup One teaspoon at bedtime, as needed, for excessive cough Patient taking differently: Take 5 mLs by mouth at bedtime as needed for cough. as needed, for excessive cough 02/11/15  Yes [name], MD  RABEprazole (ACIPHEX) 20 MG tablet TAKE 1 TABLET BY MOUTH TWICE DAILY. 01/20/15  Yes Orvil Feil, NP  RESTASIS 0.05 % ophthalmic emulsion Place 1 drop into both eyes 2 (two) times daily.  02/04/14  Yes Historical Provider, MD  risperiDONE (RISPERDAL) 0.5 MG tablet Take 1 tablet (0.5 mg total) by mouth at bedtime. 02/02/15  Yes Cloria Spring, MD  rosuvastatin (CRESTOR) 5 MG tablet TAKE 1 TABLET BY MOUTH AT BEDTIME. 12/22/14  Yes [name], MD  sertraline (ZOLOFT) 100 MG tablet Take 2 tablets (200 mg total) by mouth daily. 02/02/15  Yes Cloria Spring, MD  spironolactone (ALDACTONE) 25 MG tablet TAKE ONE TABLET BY MOUTH DAILY. 12/22/14  Yes [name], MD  traZODone (DESYREL) 150 MG tablet Take 2 tablets (300 mg total) by mouth at bedtime. 02/02/15  Yes Cloria Spring, MD  vitamin E 400 UNIT capsule Take 400 Units by mouth daily.   Yes Historical Provider, MD  ZETIA 10 MG tablet TAKE ONE TABLET BY MOUTH ONCE DAILY. 12/22/14  Yes [name], MD  ACCU-CHEK FASTCLIX LANCETS MISC Use to test blood sugar 4 times daily. Dx: E10.65 12/03/14   Philemon Kingdom, MD  Alcohol Swabs (B-D SINGLE USE SWABS REGULAR) PADS Use for injections and glucose testing 8 times daily. Dx: E10.65 12/03/14   Philemon Kingdom, MD  Blood Glucose Calibration (ACCU-CHEK SMARTVIEW CONTROL) LIQD 1 each by Other route as needed. 12/03/14   Philemon Kingdom, MD  Blood Glucose Monitoring Suppl (ACCU-CHEK NANO SMARTVIEW) W/DEVICE KIT 1 each by Does not apply route daily. Dx: E10.65 12/03/14   Philemon Kingdom, MD  glucose blood (ACCU-CHEK SMARTVIEW) test strip Use to test blood sugar 4 times daily as instructed. Dx: E10.65 12/03/14   Philemon Kingdom,  MD  Insulin Pen Needle (CLICKFINE PEN NEEDLES) 32G X 4 MM MISC Use 4x a day 09/21/14   Philemon Kingdom, MD  predniSONE (DELTASONE) 5 MG tablet Take 1 tablet (5 mg total) by mouth 2 (two) times daily with a meal. Patient not taking: Reported on 02/21/2015  02/11/15   [name], MD   Physical Exam: Filed Vitals:   02/21/15 1500 02/21/15 1515 02/21/15 1558 02/21/15 1602  BP: 121/57 124/70  128/70  Pulse:  55  59  Temp:   97.6 F (36.4 C)   TempSrc:      Resp: _0 Height:      Weight:      SpO2:  100%  100%    Wt Readings from Last 3 Encounters:  02/21/15 72.576 kg (160 lb)  02/03/15 67.132 kg (148 lb)  02/02/15 71.668 kg (158 lb)    General:  Appears calm and comfortable; 64 year old African-American woman, alert, no acute distress. Eyes: PERRL, normal lids, irises & conjunctiva; conjunctivae are clear and sclerae are white. ENT: grossly normal hearing; oropharynx reveals no teeth. Mixed membranes are dry. Neck: no LAD, masses or thyromegaly Cardiovascular: S1, S2, with a soft systolic murmur and borderline bradycardia. No LE edema. Telemetry: SR, no arrhythmias  Respiratory: Coarse breath sounds with occasional scattered wheezes; breathing nonlabored at rest. Abdomen: Positive bowel sounds, soft, nontender, nondistended. Skin: no rash or induration seen on limited exam Musculoskeletal/extremities: No acute hot red joints. No tenderness or discomfort with passive range of motion. She does have a left hand drop. Psychiatric: grossly normal mood and affect, speech fluent and appropriate. Neurologic: She has a left hand drop and is unable to extend her left hand. She is able to lift her left arm against gravity. She is able to lift her left leg against gravity. She lives both her right arm and leg against gravity. She has a slightly weaker hand grip on the left. Cranial nerves II through XII are grossly intact without no evidence of dysphasia, aphasia, dysarthria, or facial droop.           Labs on Admission:  Basic Metabolic Panel:  Recent Labs Lab 02/21/15 1039 02/21/15 1044  NA 136 140  K 4.8 4.9  CL 106 105  CO2 25  --   GLUCOSE 132* 124*  BUN 34* 32*  CREATININE 1.49* 1.50*  CALCIUM 9.0  --    Liver  Function Tests:  Recent Labs Lab 02/21/15 1039  AST 24  ALT 25  ALKPHOS 90  BILITOT 0.3  PROT 6.7  ALBUMIN 3.5   No results for input(s): LIPASE, AMYLASE in the last 168 hours. No results for input(s): AMMONIA in the last 168 hours. CBC:  Recent Labs Lab 02/21/15 1039 02/21/15 1044  WBC 7.3  --   HGB 9.4* 11.2*  HCT 31.2* 33.0*  MCV 75.7*  --   PLT 201  --    Cardiac Enzymes:  Recent Labs Lab 02/21/15 1039  TROPONINI <0.03    BNP (last 3 results) No results for input(s): BNP in the last 8760 hours.  ProBNP (last 3 results) No results for input(s): PROBNP in the last 8760 hours.  CBG: No results for input(s): GLUCAP in the last 168 hours.  Radiological Exams on Admission: Ct Head Wo Contrast  02/21/2015  CLINICAL DATA:  Left-sided weakness EXAM: CT HEAD WITHOUT CONTRAST TECHNIQUE: Contiguous axial images were obtained from the base of the skull through the vertex without intravenous contrast. COMPARISON:  02/01/2015. FINDINGS: There are changes consistent with frontal craniotomy consistent with the given clinical history of meningioma resection. Encephalomalacia changes are noted in the frontal lobes bilaterally consistent with the prior surgery. These are stable from the previous exam. No findings to suggest acute hemorrhage, acute infarction or space-occupying mass lesion are noted. IMPRESSION: Chronic changes in the frontal lobes consistent with a prior surgical resection. No acute abnormality is noted. These results were called by telephone at the time of interpretation on 02/21/2015 at 10:45 am to Dr. [name] , who verbally acknowledged these results. Electronically Signed   By: Inez Catalina M.D.   On: 02/21/2015 10:47   Dg Chest Port 1 View  02/21/2015  CLINICAL DATA:  Chest pain EXAM: PORTABLE CHEST - 1 VIEW COMPARISON:  12/26/2014 FINDINGS: Cardiac shadow is mildly enlarged. Bibasilar atelectatic changes are seen. No focal confluent infiltrate is  noted. No acute bony abnormality is seen. IMPRESSION: Minimal bibasilar atelectasis. Electronically Signed   By: Inez Catalina M.D.   On: 02/21/2015 11:03    EKG: Independently reviewed. Sinus bradycardia, heart rate 56 bpm, probable LVH.  Assessment/Plan Principal Problem:   Chest tightness Active Problems:   Weakness of left upper extremity   Acute bronchitis   AKI (acute kidney injury) ([place])   Essential hypertension   Obstructive sleep apnea   Bipolar disorder ([facility])   Hemiplegia affecting non-dominant side, post-stroke ([facility])   Diabetes mellitus type 2 in obese [facility])   Hypothyroidism   64 year old with polypharmacy and multiple medical conditions, presents with chest tightness and left upper extremity weakness superimposed on chronic left-sided weakness from her previous stroke. I do not believe the patient has ACS. I believe her chest pain is more likely from acute bronchitis. I do not believe she has had an acute stroke, but it will need to be ruled out. She has a left hand drop which could be secondary to worsening cervical disc disease or a mononeuropathy given her chronic comorbid conditions.   Plan: 1. Will start treatment for acute bronchitis with azithromycin, IV site Solu-Medrol, albuterol nebulizer. Will treat her cough with Tessalon Perles and as needed Robitussin-DM. 2. Will continue her aspirin and statin. 3. Will start IV fluid hydration due to her acute kidney injury, likely from prerenal azotemia. Will hold spironolactone until tomorrow. 4. Her blood pressures on the lower side of normal. Will decrease the dose of clonidine. Will hold metoprolol and losartan until tomorrow morning. Will discontinue Norvasc for now. 5. Will continue insulin therapy for her diabetes with sliding scale NovoLog and Lantus. 6. Will continue her psychotropic medications. 7. Will continue Synthroid. Will order TSH for further evaluation. 8. For further evaluation, will cycle cardiac enzymes,  order MRI of the brain, MRI of the cervical spine, 2-D echo, fasting lipid panel, TSH, vitamin B12, hemoglobin A1c. 9. Would order PT and OT evaluation. 10. She passed the bedside swallow evaluation, so we'll start a dysphagia 2 diet.   Code Status: Full code DVT Prophylaxis: Lovenox Family Communication: Family not available; discussed with patient Disposition Plan: Likely discharge to home in 48 hours.  Time spent: One hour.  [facility] Pager [phone]

## 2015-02-21 NOTE — ED Notes (Signed)
Per Dr. Rogene Houston.  Cancelling stemi.   Ok to give aspirin.  CT report back.

## 2015-02-21 NOTE — ED Notes (Signed)
Advised family member Dr Rogene Houston would be in to speak with her.

## 2015-02-21 NOTE — ED Notes (Signed)
Pt home health aide came by and left number.  Requested to be called when disposition was set.   Netty Starring [phone].

## 2015-02-21 NOTE — ED Provider Notes (Signed)
CSN: [id number]     Arrival date & time 02/21/15  1002 History  By signing my name below, I, [name], attest that this documentation has been prepared under the direction and in the presence of [name] Sorrow, MD. Electronically Signed: Virgel [name], ED Scribe. 02/21/2015. 10:25 AM.   Chief Complaint  Patient presents with  . Chest Pain  . Extremity Weakness   The history is provided by the patient. No language interpreter was used.   HPI Comments: [name] is a 64 y.o. female who presents to the Emergency Department with hx of HTN, CVA, and stroke with permanent left-sided weakness complaining of 7/10 left-sided, non-radiating chest pain that started 22.5 hours ago. She describes the pain as tightness in her chest. Patent reports associated nausea and increased left hand weakness that is worse than the baseline weakness due to a previous stroke. Per patient, she states that she has had a cough and cold-like symptoms for 1 week. She notes that pain is present without cough. Patient denies SOB.   Past Medical History  Diagnosis Date  . Bipolar disorder ([place])   . CVA (cerebral infarction)   . Pancreatitis 2006    due to Depakote with normal EUS   . Osteoporosis   . Chronic back pain   . Trigger finger   . Anxiety disorder   . Hypertension   . Migraines     chronic headaches  . Diverticulosis     TCS 9/08 by Dr. Delfin Edis for diarrhea . Bx for micro scopic colitis negative.   . Schatzki's ring     non critical / EGD with ED 8/2011with RMR  . S/P colonoscopy 9774,[id number], 2011    left-sided diverticula, hx of simple adenomas . 2011, random bx negative for microscopic colitis  . Glaucoma   . Allergic rhinitis   . Hypothyroidism     thyroid goiter  . Anemia   . Blood transfusion   . GERD (gastroesophageal reflux disease)   . Stroke [facility])     left sided weakness  . Seizures ([place])     unknown etiology-on meds-last seizure was 3 yerars ago  . Anxiety   .  Depression   . Metabolic encephalopathy 9/53/2023  . Sleep apnea     on CPAP  . Arthritis   . Gum symptoms     infection on antibiotic  . Chronic neck pain   . Mononeuritis lower limb   . Frequent falls   . Diabetes mellitus     Type II  . Complication of anesthesia     "hard to put sleep"   Past Surgical History  Procedure Laterality Date  . Abdominal hysterectomy  1978  . Cholecystectomy  1984  . Ovarian cyst removal    . Carpal tunnel release Left 07/22/04    Dr. Aline Brochure  . Breast reduction surgery  1994  . Cataract extraction Bilateral   . Biopsy thyroid  2009  . Surgical excision of 3 tumors from right thigh and right buttock  and left upper thigh  2010  . Back surgery  July 2012  . Spine surgery  09/29/2010    Dr. Rolena Infante  . Maloney dilation  12/29/2010    RMR;  . Esophagogastroduodenoscopy  12/29/2010    Rourk-Retained food in the esophagus and stomach, small hiatal hernia, status post Maloney dilation of the esophagus  . Craniotomy  11/23/2011    Procedure: CRANIOTOMY TUMOR EXCISION;  Surgeon: Hosie Spangle, MD;  Location: [place]  ORS;  Service: Neurosurgery;  Laterality: N/A;  Craniotomy for tumor resection  . Brain surgery  11/2011    resection of meningioma  . Colonoscopy N/A 09/25/2012    SUP:JSRPRXY diverticulosis.  colonic polyp-removed : tubular adenoma  . Esophagogastroduodenoscopy N/A 09/25/2012    VOP:[id number] atonic baggy esophagus status post Maloney dilation 32 F. Hiatal hernia  . Givens capsule study N/A 01/15/2013    NORMAL.   . Bacterial overgrowth test N/A 05/05/2013    Procedure: BACTERIAL OVERGROWTH TEST;  Surgeon: Daneil Dolin, MD;  [facility];  Service: Endoscopy;  Laterality: N/A;  7:30  . Transthoracic echocardiogram  2010    EF 60-65%, mild conc LVH, grade 1 diastolic dysfunction; mildly calcified MV annulus with mildly thickened leaflets, mildly calcified MR annulus  . Nm myocar perf wall motion  2006    "relavtiely  normal" persantine, mild anterior thinning (breast attenuation artifact), no region of scar/ischemia  . Cardiac catheterization  05/10/2005    normal coronaries, normal LV systolic function and EF (Dr. Jackie Plum)  . Tooth extraction Bilateral 12/14/2014    Procedure: REMOVAL OF BILATERAL MANDIBULAR EXOSTOSES;  Surgeon: Diona Browner, DDS;  Location: [place];  Service: Oral Surgery;  Laterality: Bilateral;   Family History  Problem Relation Age of Onset  . Heart attack Mother     HTN  . Pneumonia Father   . Kidney failure Father   . Diabetes Father   . Pancreatic cancer Sister   . Diabetes Brother   . Hypertension Brother   . Diabetes Brother   . Colon cancer Neg Hx   . Anesthesia problems Neg Hx   . Hypotension Neg Hx   . Malignant hyperthermia Neg Hx   . Pseudochol deficiency Neg Hx   . Stroke Maternal Grandmother   . Heart attack Maternal Grandfather   . Cancer Sister     breast   . Alcohol abuse Maternal Uncle   . Hypertension Brother    Social History  Substance Use Topics  . Smoking status: Current Some Day Smoker -- 0.25 packs/day for 7 years    Types: Cigarettes  . Smokeless tobacco: Never Used     Comment: quit for 5 weeks now   . Alcohol Use: No     Comment:     OB History    Gravida Para Term Preterm AB TAB SAB Ectopic Multiple Living   6 1 1  5  5         Review of Systems  Unable to perform ROS: Acuity of condition  Cardiovascular: Positive for chest pain (Left-sided).  Gastrointestinal: Positive for nausea.  Neurological: Positive for weakness (Left-hand increased from baseline).      Allergies  Cephalexin; Iron; Milk-related compounds; Penicillins; and Phenazopyridine hcl  Home Medications   Prior to Admission medications   Medication Sig Start Date End Date Taking? Authorizing Provider  ACCU-CHEK FASTCLIX LANCETS MISC Use to test blood sugar 4 times daily. Dx: E10.65 12/03/14   Philemon Kingdom, MD  Alcohol Swabs (B-D SINGLE USE SWABS REGULAR) PADS  Use for injections and glucose testing 8 times daily. Dx: E10.65 12/03/14   Philemon Kingdom, MD  amLODipine (NORVASC) 5 MG tablet Take 1 tablet (5 mg total) by mouth daily. 12/30/14   [name], MD  aspirin EC 81 MG tablet Take 81 mg by mouth daily.    Historical Provider, MD  azithromycin (ZITHROMAX) 250 MG tablet Two tablets on day one, then one daily for an additional four  days 02/17/15   [name], MD  benzonatate (TESSALON) 100 MG capsule Take 1 capsule (100 mg total) by mouth 3 (three) times daily. 02/16/15   [name], MD  Blood Glucose Calibration (ACCU-CHEK SMARTVIEW CONTROL) LIQD 1 each by Other route as needed. 12/03/14   Philemon Kingdom, MD  Blood Glucose Monitoring Suppl (ACCU-CHEK NANO SMARTVIEW) W/DEVICE KIT 1 each by Does not apply route daily. Dx: E10.65 12/03/14   Philemon Kingdom, MD  Cholecalciferol (D 5000) 5000 UNITS capsule Take 5,000 Units by mouth daily.    Historical Provider, MD  clobetasol cream (TEMOVATE) 1.28 % Apply 1 application topically daily.    Historical Provider, MD  cloNIDine (CATAPRES) 0.3 MG tablet TAKE 1 TABLET BY MOUTH EVERY EIGHT HOURS. ONCE AT 8AM, 4PM, AND 12 MIDNIGHT. 02/01/15   [name], MD  diclofenac sodium (VOLTAREN) 1 % GEL Apply 2 g topically daily as needed (Pain). 11/20/14   [name], MD  dicyclomine (BENTYL) 10 MG capsule TAKE 1 CAPSULE BY M OUTH THREE TIMES DAILY BEFORE MEALS. 01/11/15   [name], MD  docusate sodium (COLACE) 100 MG capsule Take 2 capsules (200 mg total) by mouth at bedtime. 06/07/14   Kathie Dike, MD  fluticasone (FLONASE) 50 MCG/ACT nasal spray Place 2 sprays into both nostrils daily.    Historical Provider, MD  glucose blood (ACCU-CHEK SMARTVIEW) test strip Use to test blood sugar 4 times daily as instructed. Dx: E10.65 12/03/14   Philemon Kingdom, MD  HYDROcodone-acetaminophen (NORCO/VICODIN) 5-325 MG tablet Take 1 tablet by mouth 2 (two) times daily. 12/30/14   [name], MD  hydroxychloroquine (PLAQUENIL) 200 MG tablet Take 200 mg by mouth daily.    Historical Provider, MD  insulin aspart (NOVOLOG FLEXPEN) 100 UNIT/ML FlexPen Inject 20-22 Units into the skin 3 (three) times daily with meals. 09/21/14   Philemon Kingdom, MD  Insulin Glargine (TOUJEO SOLOSTAR) 300 UNIT/ML SOPN Inject 55 Units into the skin at bedtime. Patient taking differently: Inject 50 Units into the skin at bedtime.  09/21/14   Philemon Kingdom, MD  Insulin Pen Needle (CLICKFINE PEN NEEDLES) 32G X 4 MM MISC Use 4x a day 09/21/14   Philemon Kingdom, MD  lamoTRIgine (LAMICTAL) 100 MG tablet Take 1 tablet (100 mg total) by mouth 2 (two) times daily. 02/15/15   Pieter Partridge, DO  levothyroxine (SYNTHROID, LEVOTHROID) 50 MCG tablet TAKE 1 TABLET BY MOUTH ONCE DAILY AND 1/2 TABLET ON SUNDAYS. 02/15/15   Philemon Kingdom, MD  LORazepam (ATIVAN) 0.5 MG tablet Take 1 tablet (0.5 mg total) by mouth 3 (three) times daily. 02/02/15   Cloria Spring, MD  losartan (COZAAR) 100 MG tablet TAKE 1 TABLET BY MOUTH ONCE DAILY. 01/21/15   [name], MD  lubiprostone (AMITIZA) 8 MCG capsule Take 1 capsule (8 mcg total) by mouth daily after breakfast. 11/05/14   Mahala Menghini, PA-C  methocarbamol (ROBAXIN) 500 MG tablet Take 500 mg by mouth daily. 12/21/14   Historical Provider, MD  metoprolol (LOPRESSOR) 50 MG tablet TAKE 1 TABLET BY MOUTH TWICE DAILY. 09/18/14   Pixie Casino, MD  montelukast (SINGULAIR) 10 MG tablet Take 1 tablet (10 mg total) by mouth daily. 02/15/15   [name], MD  Multiple Vitamins-Minerals (ONE-A-DAY 50 PLUS PO) Take 1 tablet by mouth daily.    Historical Provider, MD  Nystatin ([place]) 100000 UNIT/GM POWD APPLY TO AFFECTED AREA 4 TIMES DAILY. Patient taking differently: Apply topically 4 (four) times  daily. APPLY TO AFFECTED AREA 4 TIMES DAILY. 10/20/14   [name], MD  predniSONE (DELTASONE) 5 MG tablet Take 1 tablet (5 mg total) by mouth 2 (two) times daily with a  meal. 02/11/15   [name], MD  pregabalin (LYRICA) 75 MG capsule Take 75 mg by mouth 2 (two) times daily.  10/26/11   Carole Civil, MD  promethazine (PHENERGAN) 12.5 MG tablet Take 1 tablet (12.5 mg total) by mouth every 6 (six) hours as needed for nausea or vomiting. 08/20/14   Mahala Menghini, PA-C  promethazine-dextromethorphan (PROMETHAZINE-DM) 6.25-15 MG/5ML syrup One teaspoon at bedtime, as needed, for excessive cough 02/11/15   [name], MD  RABEprazole (ACIPHEX) 20 MG tablet TAKE 1 TABLET BY MOUTH TWICE DAILY. 01/20/15   Orvil Feil, NP  RESTASIS 0.05 % ophthalmic emulsion Place 1 drop into both eyes 2 (two) times daily.  02/04/14   Historical Provider, MD  risperiDONE (RISPERDAL) 0.5 MG tablet Take 1 tablet (0.5 mg total) by mouth at bedtime. 02/02/15   Cloria Spring, MD  rosuvastatin (CRESTOR) 5 MG tablet TAKE 1 TABLET BY MOUTH AT BEDTIME. 12/22/14   [name], MD  sertraline (ZOLOFT) 100 MG tablet Take 2 tablets (200 mg total) by mouth daily. 02/02/15   Cloria Spring, MD  spironolactone (ALDACTONE) 25 MG tablet TAKE ONE TABLET BY MOUTH DAILY. 12/22/14   [name], MD  traZODone (DESYREL) 150 MG tablet Take 2 tablets (300 mg total) by mouth at bedtime. 02/02/15   Cloria Spring, MD  vitamin E 400 UNIT capsule Take 400 Units by mouth daily.    Historical Provider, MD  ZETIA 10 MG tablet TAKE ONE TABLET BY MOUTH ONCE DAILY. 12/22/14   [name], MD   BP 121/57 mmHg  Pulse 58  Temp(Src) 97.6 F (36.4 C) (Oral)  Resp 20  Ht 5' 1"  (1.549 m)  Wt 72.576 kg  BMI 30.25 kg/m2  SpO2 100% Physical Exam  Constitutional: She is oriented to person, place, and time. She appears well-developed and well-nourished. No distress.  HENT:  Head: Normocephalic and atraumatic.  Eyes: Conjunctivae and EOM are normal.  Neck: Neck supple. No tracheal deviation present.  Cardiovascular: Normal rate.   Pulmonary/Chest: Effort normal. No respiratory distress.   Musculoskeletal: Normal range of motion.  Neurological: She is alert and oriented to person, place, and time.  Skin: Skin is warm and dry.  Psychiatric: She has a normal mood and affect. Her behavior is normal.  Nursing note and vitals reviewed.   ED Course  Procedures (including critical care time)  DIAGNOSTIC STUDIES: Oxygen Saturation is 97% on RA, normal by my interpretation.    COORDINATION OF CARE: 10:10 AM Will order a chest x-ray, head CT, labs, aspirin and herapin. Discussed treatment plan with pt at bedside and pt agreed to plan.   Labs Review Labs Reviewed  CBC - Abnormal; Notable for the following:    Hemoglobin 9.4 (*)    HCT 31.2 (*)    MCV 75.7 (*)    MCH 22.8 (*)    All other components within normal limits  COMPREHENSIVE METABOLIC PANEL - Abnormal; Notable for the following:    Glucose, Bld 132 (*)    BUN 34 (*)    Creatinine, Ser 1.49 (*)    GFR calc non Af Amer 36 (*)    GFR calc Af Amer 42 (*)    All other components within normal limits  I-STAT CHEM 8, ED - Abnormal; Notable for the following:    BUN 32 (*)    Creatinine, Ser 1.50 (*)    Glucose, Bld 124 (*)    Hemoglobin 11.2 (*)    HCT 33.0 (*)    All other components within normal limits  TROPONIN I  APTT  PROTIME-INR  I-STAT TROPOININ, ED  I-STAT TROPOININ, ED   Results for orders placed or performed during the [name] encounter of 02/21/15  Troponin I  Result Value Ref Range   Troponin I <0.03 <0.031 ng/mL  APTT  Result Value Ref Range   aPTT 25 24 - 37 seconds  CBC  Result Value Ref Range   WBC 7.3 4.0 - 10.5 K/uL   RBC 4.12 3.87 - 5.11 MIL/uL   Hemoglobin 9.4 (L) 12.0 - 15.0 g/dL   HCT 31.2 (L) 36.0 - 46.0 %   MCV 75.7 (L) 78.0 - 100.0 fL   MCH 22.8 (L) 26.0 - 34.0 pg   MCHC 30.1 30.0 - 36.0 g/dL   RDW 14.8 11.5 - 15.5 %   Platelets 201 150 - 400 K/uL  Comprehensive metabolic panel  Result Value Ref Range   Sodium 136 135 - 145 mmol/L   Potassium 4.8 3.5 - 5.1 mmol/L    Chloride 106 101 - 111 mmol/L   CO2 25 22 - 32 mmol/L   Glucose, Bld 132 (H) 65 - 99 mg/dL   BUN 34 (H) 6 - 20 mg/dL   Creatinine, Ser 1.49 (H) 0.44 - 1.00 mg/dL   Calcium 9.0 8.9 - 10.3 mg/dL   Total Protein 6.7 6.5 - 8.1 g/dL   Albumin 3.5 3.5 - 5.0 g/dL   AST 24 15 - 41 U/L   ALT 25 14 - 54 U/L   Alkaline Phosphatase 90 38 - 126 U/L   Total Bilirubin 0.3 0.3 - 1.2 mg/dL   GFR calc non Af Amer 36 (L) >60 mL/min   GFR calc Af Amer 42 (L) >60 mL/min   Anion gap 5 5 - 15  Protime-INR  Result Value Ref Range   Prothrombin Time 14.8 11.6 - 15.2 seconds   INR 1.14 0.00 - 1.49  I-Stat Troponin, ED (not at [name], [facility])  Result Value Ref Range   Troponin i, poc 0.01 0.00 - 0.08 ng/mL   Comment 3          I-Stat Chem 8, ED  Result Value Ref Range   Sodium 140 135 - 145 mmol/L   Potassium 4.9 3.5 - 5.1 mmol/L   Chloride 105 101 - 111 mmol/L   BUN 32 (H) 6 - 20 mg/dL   Creatinine, Ser 1.50 (H) 0.44 - 1.00 mg/dL   Glucose, Bld 124 (H) 65 - 99 mg/dL   Calcium, Ion 1.29 1.13 - 1.30 mmol/L   TCO2 24 0 - 100 mmol/L   Hemoglobin 11.2 (L) 12.0 - 15.0 g/dL   HCT 33.0 (L) 36.0 - 46.0 %  I-Stat Troponin, ED (not at [facility])  Result Value Ref Range   Troponin i, poc 0.00 0.00 - 0.08 ng/mL   Comment 3           *Note: Due to a large number of results and/or encounters for the requested time period, some results have not been displayed. A complete set of results can be found in Results Review.     Imaging Review Ct Head Wo Contrast  02/21/2015  CLINICAL DATA:  Left-sided weakness EXAM: CT HEAD WITHOUT CONTRAST TECHNIQUE:  Contiguous axial images were obtained from the base of the skull through the vertex without intravenous contrast. COMPARISON:  02/01/2015. FINDINGS: There are changes consistent with frontal craniotomy consistent with the given clinical history of meningioma resection. Encephalomalacia changes are noted in the frontal lobes bilaterally consistent with the prior surgery. These are  stable from the previous exam. No findings to suggest acute hemorrhage, acute infarction or space-occupying mass lesion are noted. IMPRESSION: Chronic changes in the frontal lobes consistent with a prior surgical resection. No acute abnormality is noted. These results were called by telephone at the time of interpretation on 02/21/2015 at 10:45 am to Dr. Fredia [name] , who verbally acknowledged these results. Electronically Signed   By: Inez Catalina M.D.   On: 02/21/2015 10:47   Dg Chest Port 1 View  02/21/2015  CLINICAL DATA:  Chest pain EXAM: PORTABLE CHEST - 1 VIEW COMPARISON:  12/26/2014 FINDINGS: Cardiac shadow is mildly enlarged. Bibasilar atelectatic changes are seen. No focal confluent infiltrate is noted. No acute bony abnormality is seen. IMPRESSION: Minimal bibasilar atelectasis. Electronically Signed   By: Inez Catalina M.D.   On: 02/21/2015 11:03   I have personally reviewed and evaluated these images and lab results as part of my medical decision-making.   EKG Interpretation   Date/Time:  Sunday February 21 2015 10:09:03 EST Ventricular Rate:  56 PR Interval:  197 QRS Duration: 73 QT Interval:  426 QTC Calculation: 411 R Axis:   67 Text Interpretation:  Sinus rhythm Inferolateral infarct, acute New since  previous tracing Confirmed by Michaelene Dutan  MD, Harleyquinn Gasser [phone]) on 02/21/2015  10:11:40 AM      CRITICAL CARE Performed by: [name] Total critical care time: 30 minutes Critical care time was exclusive of separately billable procedures and treating other patients. Critical care was necessary to treat or prevent imminent or life-threatening deterioration. Critical care was time spent personally by me on the following activities: development of treatment plan with patient and/or surrogate as well as nursing, discussions with consultants, evaluation of patient's response to treatment, examination of patient, obtaining history from patient or surrogate, ordering and  performing treatments and interventions, ordering and review of laboratory studies, ordering and review of radiographic studies, pulse oximetry and re-evaluation of patient's condition.      MDM   Final diagnoses:  Chest pain, unspecified chest pain type  URI (upper respiratory infection)  Progressive focal motor weakness   Patient brought in by EMS for concerns of worsening weakness to her left arm. Patient has residual weakness left arm and leg following brain surgery. Was originally reported as possibly due to a stroke. But may been due to meningioma resection.  Patient's initial EKG was consistent with an inferior MI. A code STEMI was called discussed with cardiology. Of her story was odd and that she had upper respiratory infection for several days and also had chest tightness anterior chest since 12 noon yesterday. They requested that we check point care troponin was normal if it was normal they felt that patient could be observed for rule out. Did repeat second troponin it was also negative. Her EKGs markedly changed compared to March 2016. However there does not appear to be an acute cardiac event at this time.  Head CT without any acute changes. However MRI would be necessary to further evaluate the increased arm weakness the patient still states is there. Rest of workup without any significant findings.  Patient never did receive heparin she immediately went to head CT scanner because  of the increased arm weakness before she got any anticoagulation therapy for the potential STEMI so it would not cause a bleed.   Patient's chest x-ray without significant findings no evidence of pneumonia.  Discussed with the hospitalist team who will admit. Temporary middle orders completed.  I personally performed the services described in this documentation, which was scribed in my presence. The recorded information has been reviewed and is accurate.      [name] Sorrow, MD 02/21/15 [phone]

## 2015-02-22 ENCOUNTER — Other Ambulatory Visit: Payer: Self-pay

## 2015-02-22 ENCOUNTER — Inpatient Hospital Stay (HOSPITAL_COMMUNITY): Payer: Commercial Managed Care - HMO

## 2015-02-22 ENCOUNTER — Encounter (HOSPITAL_COMMUNITY): Payer: Self-pay | Admitting: Internal Medicine

## 2015-02-22 DIAGNOSIS — G4710 Hypersomnia, unspecified: Secondary | ICD-10-CM

## 2015-02-22 DIAGNOSIS — I272 Pulmonary hypertension, unspecified: Secondary | ICD-10-CM | POA: Diagnosis present

## 2015-02-22 DIAGNOSIS — Z79899 Other long term (current) drug therapy: Secondary | ICD-10-CM

## 2015-02-22 DIAGNOSIS — R079 Chest pain, unspecified: Secondary | ICD-10-CM

## 2015-02-22 DIAGNOSIS — E875 Hyperkalemia: Secondary | ICD-10-CM | POA: Diagnosis present

## 2015-02-22 DIAGNOSIS — N816 Rectocele: Secondary | ICD-10-CM

## 2015-02-22 DIAGNOSIS — R29898 Other symptoms and signs involving the musculoskeletal system: Secondary | ICD-10-CM | POA: Diagnosis present

## 2015-02-22 LAB — BASIC METABOLIC PANEL
ANION GAP: 5 (ref 5–15)
BUN: 27 mg/dL — AB (ref 6–20)
CHLORIDE: 110 mmol/L (ref 101–111)
CO2: 24 mmol/L (ref 22–32)
Calcium: 8.8 mg/dL — ABNORMAL LOW (ref 8.9–10.3)
Creatinine, Ser: 1.16 mg/dL — ABNORMAL HIGH (ref 0.44–1.00)
GFR calc Af Amer: 56 mL/min — ABNORMAL LOW (ref >60)
GFR, EST NON AFRICAN AMERICAN: 49 mL/min — AB (ref >60)
Glucose, Bld: 182 mg/dL — ABNORMAL HIGH (ref 65–99)
POTASSIUM: 5.3 mmol/L — AB (ref 3.5–5.1)
SODIUM: 139 mmol/L (ref 135–145)

## 2015-02-22 LAB — CBC
HEMATOCRIT: 33.4 % — AB (ref 36.0–46.0)
HEMOGLOBIN: 10 g/dL — AB (ref 12.0–15.0)
MCH: 22.7 pg — ABNORMAL LOW (ref 26.0–34.0)
MCHC: 29.9 g/dL — ABNORMAL LOW (ref 30.0–36.0)
MCV: 75.9 fL — AB (ref 78.0–100.0)
Platelets: 226 10*3/uL (ref 150–400)
RBC: 4.4 MIL/uL (ref 3.87–5.11)
RDW: 14.3 % (ref 11.5–15.5)
WBC: 6.1 10*3/uL (ref 4.0–10.5)

## 2015-02-22 LAB — GLUCOSE, CAPILLARY
GLUCOSE-CAPILLARY: 151 mg/dL — AB (ref 65–99)
Glucose-Capillary: 165 mg/dL — ABNORMAL HIGH (ref 65–99)
Glucose-Capillary: 217 mg/dL — ABNORMAL HIGH (ref 65–99)

## 2015-02-22 LAB — TROPONIN I

## 2015-02-22 LAB — MRSA PCR SCREENING: MRSA BY PCR: NEGATIVE

## 2015-02-22 LAB — VITAMIN B12: Vitamin B-12: 5221 pg/mL — ABNORMAL HIGH (ref 180–914)

## 2015-02-22 MED ORDER — ALBUTEROL SULFATE HFA 108 (90 BASE) MCG/ACT IN AERS: 1.0000 | INHALATION_SPRAY | Freq: Three times a day (TID) | RESPIRATORY_TRACT | Status: DC

## 2015-02-22 MED ORDER — AMLODIPINE BESYLATE 5 MG PO TABS
5.0000 mg | ORAL_TABLET | Freq: Every day | ORAL | Status: DC
  Administered 2015-02-22: 5 mg via ORAL
  Filled 2015-02-22: qty 1

## 2015-02-22 MED ORDER — ENOXAPARIN SODIUM 40 MG/0.4ML ~~LOC~~ SOLN
40.0000 mg | SUBCUTANEOUS | Status: DC
Start: 2015-02-22 — End: 2015-02-22
  Administered 2015-02-22: 40 mg via SUBCUTANEOUS
  Filled 2015-02-22: qty 0.4

## 2015-02-22 MED ORDER — ALBUTEROL SULFATE (2.5 MG/3ML) 0.083% IN NEBU: 2.5000 mg | INHALATION_SOLUTION | RESPIRATORY_TRACT | Status: DC | PRN

## 2015-02-22 MED ORDER — ALBUTEROL SULFATE (2.5 MG/3ML) 0.083% IN NEBU
2.5000 mg | INHALATION_SOLUTION | Freq: Three times a day (TID) | RESPIRATORY_TRACT | Status: DC
  Administered 2015-02-22: 2.5 mg via RESPIRATORY_TRACT

## 2015-02-22 MED ORDER — GADOBENATE DIMEGLUMINE 529 MG/ML IV SOLN
7.0000 mL | Freq: Once | INTRAVENOUS | Status: AC | PRN
  Administered 2015-02-22: 7 mL via INTRAVENOUS

## 2015-02-22 MED ORDER — PREDNISONE 5 MG PO TABS: 5.0000 mg | ORAL_TABLET | Freq: Two times a day (BID) | ORAL | Status: DC

## 2015-02-22 NOTE — Care Management Important Message (Signed)
Important Message  Patient Details  Name: [name] MRN: [id number] Date of Birth: 1950/08/03   Medicare Important Message Given:  N/A - LOS <3 / Initial given by admissions    Sherald Barge, RN 02/22/2015, 11:54 AM

## 2015-02-22 NOTE — Evaluation (Signed)
Physical Therapy Evaluation Patient Details Name: [name] MRN: [id number] DOB: April 01, 1950 Today's Date: 02/22/2015   History of Present Illness  : [name] is a 64 y.o. female with multiple medical conditions including previous CVA with chronic left-sided hemiparesis, cervical radiculopathy, bipolar disorder with psychotic features, chronic bronchitis, obstructive sleep apnea, seizure disorder, and diabetes mellitus. She presents with a chief complaint of central chest tightness and left upper extremity weakness. Her symptoms started early yesterday. She describes the chest pain as tightness. It was initially 7/10 in intensity. It starts in her central chest and then radiates to the left. It is not associated with diaphoresis or nausea. It is associated with shortness of breath, cough, and wheezing. URI symptoms started last week. She has had subjective chills, but no fever. She has had a nonproductive cough. Her left arm and leg are chronically weak from her previous stroke. However, yesterday afternoon, she noticed more weakness in her left hand. She is unable to extend her left hand. She denies difficulty swallowing, left leg weakness, trouble understanding, or difficulty speaking. She denies a headache.  Clinical Impression   Pt was seen for evaluation.  She was alert and oriented, cooperative.  She has had some residual left sided weakness from an old stroke but it was difficult to ascertain the degree of weakness she had as compared to now.  Currently, she is generally deconditioned with LLE strength less than RLE strength.  She normally ambulates with a cane or walker.   Because of her sudden acute onset of weakness in the wrist and finger extensors the use of a walker will not be advisable.  Fortunately she is able to ambulate short distances with a cane.  I am requesting HHPT to insure gait stability at home.  She may, at some point, need a left platform for her walker if the wrist  strength does not improve.  In the event that OT is unable to see her this afternoon before discharge, I have taken the liberty to order a wrist cockup splint for the left UE.     Follow Up Recommendations Home health PT    Equipment Recommendations  None recommended by PT    Recommendations for Other Services OT consult     Precautions / Restrictions Precautions Precautions: Fall Restrictions Weight Bearing Restrictions: No      Mobility  Bed Mobility Overal bed mobility: Modified Independent                Transfers Overall transfer level: Needs assistance Equipment used: Straight cane Transfers: Sit to/from Stand Sit to Stand: Supervision            Ambulation/Gait Ambulation/Gait assistance: Supervision Ambulation Distance (Feet): 30 Feet Assistive device: Straight cane Gait Pattern/deviations: Decreased stride length;Decreased dorsiflexion - right;Decreased dorsiflexion - left   Gait velocity interpretation: <1.8 ft/sec, indicative of risk for recurrent falls    Stairs            Wheelchair Mobility    Modified Rankin (Stroke Patients Only)       Balance Overall balance assessment: No apparent balance deficits (not formally assessed)                                           Pertinent Vitals/Pain Pain Assessment: No/denies pain    Home Living Family/patient expects to be discharged to:: Private residence Living Arrangements:  Alone Available Help at Discharge: Home health;Available PRN/intermittently Type of Home: Apartment Home Access: Level entry     Home Layout: One level Home Equipment: Walker - 2 wheels;Cane - single point;Bedside commode;Wheelchair - manual;Shower seat;Hospital bed      Prior Function Level of Independence: Needs assistance   Gait / Transfers Assistance Needed: ambulates independently with a cane or walker, transfers independently  ADL's / [place] Needed: full assist with  household activities:  she is able to give herself a sponge bath and dress herself        Hand Dominance   Dominant Hand: Right    Extremity/Trunk Assessment   Upper Extremity Assessment: Defer to OT evaluation (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 with significant weakness of the left wrist and finger extensors)           Lower Extremity Assessment: LLE deficits/detail;Generalized weakness   LLE Deficits / Details: LLE about 1/2 to 1 grade weaker than RLE due to old stroke ---strength generally 2+/5  Cervical / Trunk Assessment: Kyphotic  Communication   Communication: No difficulties  Cognition Arousal/Alertness: Awake/alert Behavior During Therapy: WFL for tasks assessed/performed Overall Cognitive Status: Within Functional Limits for tasks assessed                      General Comments      Exercises        Assessment/Plan    PT Assessment All further PT needs can be met in the next venue of care  PT Diagnosis Difficulty walking;Generalized weakness   PT Problem List Decreased strength;Decreased activity tolerance;Decreased mobility  PT Treatment Interventions     PT Goals (Current goals can be found in the Care Plan section) Acute Rehab PT Goals PT Goal Formulation: All assessment and education complete, DC therapy    Frequency     Barriers to discharge        Co-evaluation               End of Session Equipment Utilized During Treatment: Gait belt Activity Tolerance: Patient tolerated treatment well Patient left: in bed;with call bell/phone within reach;with bed alarm set           Time: 6015-6153 PT Time Calculation (min) (ACUTE ONLY): 36 min   Charges:   PT Evaluation $Initial PT Evaluation Tier I: 1 Procedure     PT G CodesOwens Shark, Nigel Wessman L  PT 02/22/2015, 1:35 PM [phone]

## 2015-02-22 NOTE — Care Management Note (Signed)
Case Management Note  Patient Details  Name: JAUNA BORAM MRN: [id number] Date of Birth: 1950/10/07  Subjective/Objective:                  Pt is from home, lives alone and has CAP aid 6 hrs per day 5 days a week and 2 hr per day sat/sunday. Pt has cane, walker, wheelchair to use as needed. Pt also has neb machine, CPAP and lifeline. Pt has RN who comes from [facility].   Action/Plan: Pt plans to return home today with resumption of CAP aid and RN/PT services through [facility]. Pt is aware HH has 48 hours to make first visit after DC. Farrah, [facility] Rep, has been made aware of referral and will obtain pt info from chart. Pt has no further CM needs at this time.   Expected Discharge Date:     12 /02/2015             Expected Discharge Plan:  [place]  In-House Referral:  NA  Discharge planning Services  CM Consult  Post Acute Care Choice:  Home Health Choice offered to:  Patient  DME Arranged:    DME Agency:     HH Arranged:  RN, PT HH Agency:  [facility]  Status of Service:  Completed, signed off  Medicare Important Message Given:  N/A - LOS <3 / Initial given by admissions Date Medicare IM Given:    Medicare IM give by:    Date Additional Medicare IM Given:    Additional Medicare Important Message give by:     If discussed at [place] of Stay Meetings, dates discussed:    Additional Comments:  Sherald Barge, RN 02/22/2015, 11:54 AM

## 2015-02-22 NOTE — Discharge Summary (Signed)
Physician Discharge Summary  [name] Rising VEL:[id number] DOB: December 07, 1950 DOA: 02/21/2015  PCP: Tula Nakayama, MD  Admit date: 02/21/2015 Discharge date: 02/22/2015  Time spent: Greater than 30  minutes  Recommendations for Outpatient Follow-up:  1. Recommend rechecking the patient's serum potassium at the hospital follow-up as it was elevated at time of discharge. Losartan as coronal lactone were withheld at the time of discharge. 2. Recommend follow-up of the patient's polypharmacy. 3. Patient received a left hand splint by occupational therapy. Home health occupational therapy was ordered.     Discharge Diagnoses:  1. Chest tightness secondary to acute bronchitis. 2. Acute on chronic weakness of the left upper extremity with left hand drop. 3. Chronic left-sided hemiparesis secondary to a previous stroke. 4. Mild hyperkalemia. 5. Diabetes mellitus with complications. 6. Obstructive sleep apnea. Patient refused CPAP in the hospital. 7. Hypothyroidism. 8. Acute kidney injury, secondary to prerenal azotemia. 9. Essential hypertension. 10. Obesity. 11. Bipolar disorder. 12. Polypharmacy.   Discharge Condition: Improved.  Diet recommendation: Heart healthy/carbohydrate modified.  Filed Weights   02/21/15 1009 02/22/15 0507  Weight: 72.576 kg (160 lb) 72.3 kg (159 lb 6.3 oz)    History of present illness:   HPI: [name] is a 64 y.o. female with multiple medical conditions who presented with a chief complaint of central chest tightness and left upper extremity weakness. She described the chest pain as tightness. It was initially 7/10 in intensity. She had URI symptoms that started last week. She had subjective chills, but no fever. She had a nonproductive cough. Her left arm and leg are chronically weak from her previous stroke. However, she noticed more weakness in her left hand. She was unable to extend her left hand. She denied difficulty swallowing, left leg weakness,  trouble understanding, or difficulty speaking. She denied a headache. Of note, she was recently started on azithromycin, cough medication, and a prednisone taper by her PCP, [name] for URI symptoms. In the ED, she was afebrile and hemodynamically stable, though her heart rate ranged from 55-59. She was oxygenating 100% on nasal cannula oxygen. Her chest x-ray revealed minimal bibasal atelectasis. Her head CT revealed chronic changes in the frontal lobes consistent with a prior surgical resection, but no acute abnormalities. Her lab data were significant for a BUN of 32, creatinine 1.50, glucose of 124, hemoglobin of 11.2, and normal troponin I. She was admitted for further evaluation and management.    Hospital Course:  Patient was started on IV Solu-Medrol, IV azithromycin, and albuterol for treatment of presumed acute bronchitis. Her cough was treated with Ladona Ridgel and as needed Robitussin-DM. She was continued on her aspirin and statin. Because of her renal insufficiency, she was started on IV fluid hydration and spironolactone was withheld. Because her blood pressures were on the lower side of normal, the dose of clonidine was decreased, Norvasc was withheld, and metoprolol and losartan were to be started the following morning. Her diabetes was treated with sliding scale NovoLog and Lantus at significantly lower doses. On exam, she had difficulty extending her left hand, but she did not appear to have any acute cranial nerve deficits. She was able to raise her proximal left arm and her lower extremities against gravity. She had a few wheezes on exam, so her chest pain was thought to be secondary to bronchospasms or bronchitis.  For further evaluation, a number studies were ordered. Her cardiac enzymes were negative 4. Her total cholesterol was 96 with an LDL of 35.  Her vitamin B12 level was actually elevated. Her magnesium level was within normal limits. Her TSH was within normal limits at  1.2. Her echo revealed an EF of 60-65% and moderate pulmonary hypertension with a PA pressure of 45 mmHg. MRI of her brain revealed no acute findings, but with chronic small vessel ischemic changes. MRI of her cervical spine revealed age-related spondylosis, but no advanced disease and no acute abnormality to explain her clinical presentation.  Her BUN and creatinine improved with hydration, owing to mild volume depletion. I was concerned about polypharmacy, but further management was deferred to her PCP [name].  Patient was evaluated by the occupational and physical therapist. The therapist recommended a left hand splint which was ordered. Home health PT/OT was also ordered.  Her serum potassium was mildly elevated at the time of discharge, so she was instructed to not take losartan and spironolactone until she was reevaluated by her PCP.  The patient was discharged in improved condition, but the explanation for her left hand drop was unclear. It could've been a mononeuropathy or just worsening sequelae of her previous stroke. She was discharged on azithromycin, prednisone taper, and an albuterol inhaler for treatment of her bronchitis.  Procedures: 02/22/15 2-D echocardiogram: Study Conclusions - Left ventricle: The cavity size was normal. Wall thickness was increased in a pattern of mild LVH. Systolic function was normal. The estimated ejection fraction was in the range of 60% to 65%. Indeterminate diastolic function. Wall motion was normal; there were no regional wall motion abnormalities. - Aortic valve: Valve area (VTI): 2.06 cm^2. Valve area (Vmax): 2.39 cm^2. - Mitral valve: There was mild regurgitation. - Left atrium: The atrium was mildly dilated. - Atrial septum: No defect or patent foramen ovale was identified. - Pulmonary arteries: Systolic pressure was moderately increased. PA peak pressure: 45 mm Hg (S). - Technically adequate  study.  Consultations:  None  Discharge Exam: Filed Vitals:   02/22/15 0652 02/22/15 0723  BP:    Pulse:  69  Temp: 97.8 F (36.6 C)   Resp:  15    General: 64 year old African-American woman in no acute distress. Cardiovascular: S1, S2, no murmurs rubs or gallops. Respiratory: Resolution of wheezes; coarse breath sounds in the bases. Breathing nonlabored. Neurologic: Patient is unable to extend her left hand,; the remainder of her neurological exam remains unchanged. She is alert and oriented 3; no acute cranial nerve deficits noted.  Discharge Instructions   Discharge Instructions    Diet - low sodium heart healthy    Complete by:  As directed      Diet Carb Modified    Complete by:  As directed      Discharge instructions    Complete by:  As directed   1. Losartan and spironolactone was stopped until you see [name]. You will need her blood potassium rechecked in a few days. 2. Stop smoking. 3. Check your blood sugars and if they are too low, discussed changing her insulin dosing with [name].     Increase activity slowly    Complete by:  As directed           Current Discharge Medication List    START taking these medications   Details  albuterol (PROVENTIL HFA;VENTOLIN HFA) 108 (90 BASE) MCG/ACT inhaler Inhale 1 puff into the lungs 3 (three) times daily. Qty: 1 Inhaler, Refills: 2      CONTINUE these medications which have CHANGED   Details  predniSONE (DELTASONE) 5 MG tablet  Take 1 tablet (5 mg total) by mouth 2 (two) times daily with a meal. Qty: 4 tablet, Refills: 0      CONTINUE these medications which have NOT CHANGED   Details  amLODipine (NORVASC) 5 MG tablet Take 1 tablet (5 mg total) by mouth daily. Qty: 30 tablet, Refills: 3   Associated Diagnoses: Essential hypertension    aspirin EC 81 MG tablet Take 81 mg by mouth daily.    azithromycin (ZITHROMAX) 250 MG tablet Two tablets on day one, then one daily for an additional four  days Qty: 6 tablet, Refills: 0    benzonatate (TESSALON) 100 MG capsule Take 1 capsule (100 mg total) by mouth 3 (three) times daily. Qty: 30 capsule, Refills: 0    Cholecalciferol (D 5000) 5000 UNITS capsule Take 5,000 Units by mouth daily.    clobetasol cream (TEMOVATE) 5.64 % Apply 1 application topically daily.    cloNIDine (CATAPRES) 0.3 MG tablet TAKE 1 TABLET BY MOUTH EVERY EIGHT HOURS. ONCE AT 8AM, 4PM, AND 12 MIDNIGHT. Qty: 90 tablet, Refills: 3    diclofenac sodium (VOLTAREN) 1 % GEL Apply 2 g topically daily as needed (Pain). Qty: 100 g, Refills: 0    dicyclomine (BENTYL) 10 MG capsule TAKE 1 CAPSULE BY M OUTH THREE TIMES DAILY BEFORE MEALS. Qty: 90 capsule, Refills: 0    docusate sodium (COLACE) 100 MG capsule Take 2 capsules (200 mg total) by mouth at bedtime. Qty: 30 capsule, Refills: 0    fluticasone (FLONASE) 50 MCG/ACT nasal spray Place 2 sprays into both nostrils daily.    HYDROcodone-acetaminophen (NORCO/VICODIN) 5-325 MG tablet Take 1 tablet by mouth 2 (two) times daily. Qty: 14 tablet, Refills: 0    hydroxychloroquine (PLAQUENIL) 200 MG tablet Take 200 mg by mouth daily.    insulin aspart (NOVOLOG FLEXPEN) 100 UNIT/ML FlexPen Inject 20-22 Units into the skin 3 (three) times daily with meals. Qty: 30 mL, Refills: 1    Insulin Glargine (TOUJEO SOLOSTAR) 300 UNIT/ML SOPN Inject 55 Units into the skin at bedtime. Qty: 6 pen, Refills: 1    lamoTRIgine (LAMICTAL) 100 MG tablet Take 1 tablet (100 mg total) by mouth 2 (two) times daily. Qty: 60 tablet, Refills: 1    levothyroxine (SYNTHROID, LEVOTHROID) 50 MCG tablet TAKE 1 TABLET BY MOUTH ONCE DAILY AND 1/2 TABLET ON SUNDAYS. Qty: 30 tablet, Refills: 2    LORazepam (ATIVAN) 0.5 MG tablet Take 1 tablet (0.5 mg total) by mouth 3 (three) times daily. Qty: 90 tablet, Refills: 2    lubiprostone (AMITIZA) 8 MCG capsule Take 1 capsule (8 mcg total) by mouth daily after breakfast. Qty: 20 capsule, Refills: 0     methocarbamol (ROBAXIN) 500 MG tablet Take 500 mg by mouth daily.    metoprolol (LOPRESSOR) 50 MG tablet TAKE 1 TABLET BY MOUTH TWICE DAILY. Qty: 60 tablet, Refills: 6    montelukast (SINGULAIR) 10 MG tablet Take 1 tablet (10 mg total) by mouth daily. Qty: 30 tablet, Refills: 2    Multiple Vitamins-Minerals (ONE-A-DAY 50 PLUS PO) Take 1 tablet by mouth daily.    Nystatin (NYAMYC) 100000 UNIT/GM POWD APPLY TO AFFECTED AREA 4 TIMES DAILY. Qty: 30 g, Refills: 2    pregabalin (LYRICA) 75 MG capsule Take 75 mg by mouth 2 (two) times daily.     promethazine (PHENERGAN) 12.5 MG tablet Take 1 tablet (12.5 mg total) by mouth every 6 (six) hours as needed for nausea or vomiting. Qty: 30 tablet, Refills: 0    promethazine-dextromethorphan (  PROMETHAZINE-DM) 6.25-15 MG/5ML syrup One teaspoon at bedtime, as needed, for excessive cough Qty: 118 mL, Refills: 0    RABEprazole (ACIPHEX) 20 MG tablet TAKE 1 TABLET BY MOUTH TWICE DAILY. Qty: 60 tablet, Refills: 3    RESTASIS 0.05 % ophthalmic emulsion Place 1 drop into both eyes 2 (two) times daily.     risperiDONE (RISPERDAL) 0.5 MG tablet Take 1 tablet (0.5 mg total) by mouth at bedtime. Qty: 30 tablet, Refills: 2    rosuvastatin (CRESTOR) 5 MG tablet TAKE 1 TABLET BY MOUTH AT BEDTIME. Qty: 30 tablet, Refills: 4    sertraline (ZOLOFT) 100 MG tablet Take 2 tablets (200 mg total) by mouth daily. Qty: 60 tablet, Refills: 2    traZODone (DESYREL) 150 MG tablet Take 2 tablets (300 mg total) by mouth at bedtime. Qty: 60 tablet, Refills: 2    vitamin E 400 UNIT capsule Take 400 Units by mouth daily.    ZETIA 10 MG tablet TAKE ONE TABLET BY MOUTH ONCE DAILY. Qty: 30 tablet, Refills: 4    ACCU-CHEK FASTCLIX LANCETS MISC Use to test blood sugar 4 times daily. Dx: E10.65 Qty: 408 each, Refills: 1    Alcohol Swabs (B-D SINGLE USE SWABS REGULAR) PADS Use for injections and glucose testing 8 times daily. Dx: E10.65 Qty: 900 each, Refills: 1     Blood Glucose Calibration (ACCU-CHEK SMARTVIEW CONTROL) LIQD 1 each by Other route as needed. Qty: 1 each, Refills: 2    Blood Glucose Monitoring Suppl (ACCU-CHEK NANO SMARTVIEW) W/DEVICE KIT 1 each by Does not apply route daily. Dx: E10.65 Qty: 1 kit, Refills: 0    glucose blood (ACCU-CHEK SMARTVIEW) test strip Use to test blood sugar 4 times daily as instructed. Dx: E10.65 Qty: 375 each, Refills: 1    Insulin Pen Needle (CLICKFINE PEN NEEDLES) 32G X 4 MM MISC Use 4x a day Qty: 300 each, Refills: 3      STOP taking these medications     losartan (COZAAR) 100 MG tablet      spironolactone (ALDACTONE) 25 MG tablet        Allergies  Allergen Reactions  . Cephalexin Hives  . Iron Nausea And Vomiting  . Milk-Related Compounds Other (See Comments)    Doesn't agree with stomach.   . Penicillins Hives    Has patient had a PCN reaction causing immediate rash, facial/tongue/throat swelling, SOB or lightheadedness with hypotension: Yes Has patient had a PCN reaction causing severe rash involving mucus membranes or skin necrosis: No Has patient had a PCN reaction that required hospitalization No Has patient had a PCN reaction occurring within the last 10 years: No If all of the above answers are "NO", then may proceed with Cephalosporin use.   [name] Phenazopyridine Hcl Hives         Follow-up Information    Follow up with [facility].   Specialty:  Home Health Services   Contact information:   [place] [id number] [phone]       Follow up with Tula Nakayama, MD On 02/25/2015.   Specialty:  Family Medicine   Why:  at 1:45 pm   Contact information:   [address], [place] [address] [phone]        The results of significant diagnostics from this hospitalization (including imaging, microbiology, ancillary and laboratory) are listed below for reference.    Significant Diagnostic Studies: Ct Head Wo  Contrast  02/21/2015  CLINICAL DATA:  Left-sided weakness  EXAM: CT HEAD WITHOUT CONTRAST TECHNIQUE: Contiguous axial images were obtained from the base of the skull through the vertex without intravenous contrast. COMPARISON:  02/01/2015. FINDINGS: There are changes consistent with frontal craniotomy consistent with the given clinical history of meningioma resection. Encephalomalacia changes are noted in the frontal lobes bilaterally consistent with the prior surgery. These are stable from the previous exam. No findings to suggest acute hemorrhage, acute infarction or space-occupying mass lesion are noted. IMPRESSION: Chronic changes in the frontal lobes consistent with a prior surgical resection. No acute abnormality is noted. These results were called by telephone at the time of interpretation on 02/21/2015 at 10:45 am to Dr. [name] , who verbally acknowledged these results. Electronically Signed   By: Inez Catalina M.D.   On: 02/21/2015 10:47   Mr Brain Wo Contrast  02/01/2015  CLINICAL DATA:  64 year old female with difficulty walking. Recent fall without head injury. History of meningioma and resection. Subsequent encounter. EXAM: MRI HEAD WITHOUT CONTRAST TECHNIQUE: Multiplanar, multiecho pulse sequences of the brain and surrounding structures were obtained without intravenous contrast. COMPARISON:  07/13/2014 brain MR. FINDINGS: Exam is motion degraded. Artifact extends through medial left thalamus on diffusion sequence. Left pons tiny area of altered signal intensity may represent T2 shine through versus result of tiny subacute infarct. Overall no definitive findings of acute infarct. Post frontal craniotomy for resection of meningioma are per history provided with surrounding encephalomalacia similar to prior exam. Contrast was not administered. Taking this limitation into account, findings appear similar to prior exam. Prominent white matter type changes of the pons similar to prior exam.  Mild periventricular white matter type changes stable. No hydrocephalus. No obvious intracranial hemorrhage although evaluation limited by motion. Major intracranial vascular structures appear patent. Post lens replacement.  Exophthalmos. Minimal paranasal sinus mucosal thickening. Cervical medullary junction, pituitary region and pineal region unremarkable. IMPRESSION: Exam is motion degraded. Left pons tiny area of altered signal intensity may represent T2 shine through versus result of tiny subacute infarct. Overall no definitive findings of acute infarct. Post frontal craniotomy for resection of meningioma as per history provided with surrounding encephalomalacia similar to prior exam. Contrast was not administered. Taking this limitation into account, findings appear similar to prior exam. Prominent white matter type changes of the pons similar to prior exam. Mild periventricular white matter type changes stable. No obvious intracranial hemorrhage although evaluation limited by motion. Electronically Signed   By: Genia Del M.D.   On: 02/01/2015 21:32   Mr Jeri Cos TR Contrast  02/22/2015  CLINICAL DATA:  Left-sided weakness and difficulty walking, 2 days duration. Previous meningioma resection. EXAM: MRI HEAD WITH CONTRAST MRI CERVICAL SPINE WITHout CONTRAST TECHNIQUE: Multiplanar, multiecho pulse sequences of the brain and surrounding structures, and cervical spine, to include the craniocervical junction and cervicothoracic junction, were obtained with intravenous contrast. Cervical study was done without contrast. CONTRAST:  23mL MULTIHANCE GADOBENATE DIMEGLUMINE 529 MG/ML IV SOLN COMPARISON:  Head CT 02/21/2015.  MRI 02/01/2015.  MRI 07/13/2014. FINDINGS: MRI HEAD FINDINGS Diffusion imaging does not show any acute or subacute infarction. There are extensive chronic small vessel ischemic changes throughout the pons. No focal cerebellar insult. The cerebral hemispheres show atrophy, encephalomalacia and  gliosis presumably secondary to previous meningioma and subsequent resection. No evidence of residual or recurrent meningioma in that region. No intra-axial mass lesion, acute hemorrhage, hydrocephalus or extra-axial fluid collection. No pituitary mass. No inflammatory sinus disease. No acute calvarial finding. Major vessels at the base of the brain  show flow. MRI CERVICAL SPINE FINDINGS There is no abnormality at the foramen magnum, C1-2 or C2-3. C3-4: Minimal spondylosis with minimal bulging of the disc. No stenosis. C4-5: Minimal spondylosis with minimal bulging of the disc. Mild left-sided facet degeneration. No stenosis. C5-6: Shallow protrusion of disc material narrows the ventral subarachnoid space but does not compress the cord. Mild foraminal encroachment by uncovertebral osteophytes without likely compressive stenosis. C6-7:  Bulging of the disc.  No canal or foraminal stenosis. C7-T1:  Bulging of the disc.  No canal or foraminal stenosis. No abnormal cord signal. IMPRESSION: MRI brain: No acute finding. Chronic small-vessel ischemic changes of the pons. Atrophy and gliosis of the frontal lobes related to previous planum sphenoidale meningioma with resection. No evidence of residual or recurrent tumor. MRI cervical spine: Ordinary age related spondylosis. No advanced disease. No abnormality seen to explain the clinical presentation. Electronically Signed   By: Nelson Chimes M.D.   On: 02/22/2015 09:03   Mr Cervical Spine Wo Contrast  02/22/2015  CLINICAL DATA:  Left-sided weakness and difficulty walking, 2 days duration. Previous meningioma resection. EXAM: MRI HEAD WITH CONTRAST MRI CERVICAL SPINE WITHout CONTRAST TECHNIQUE: Multiplanar, multiecho pulse sequences of the brain and surrounding structures, and cervical spine, to include the craniocervical junction and cervicothoracic junction, were obtained with intravenous contrast. Cervical study was done without contrast. CONTRAST:  15m MULTIHANCE  GADOBENATE DIMEGLUMINE 529 MG/ML IV SOLN COMPARISON:  Head CT 02/21/2015.  MRI 02/01/2015.  MRI 07/13/2014. FINDINGS: MRI HEAD FINDINGS Diffusion imaging does not show any acute or subacute infarction. There are extensive chronic small vessel ischemic changes throughout the pons. No focal cerebellar insult. The cerebral hemispheres show atrophy, encephalomalacia and gliosis presumably secondary to previous meningioma and subsequent resection. No evidence of residual or recurrent meningioma in that region. No intra-axial mass lesion, acute hemorrhage, hydrocephalus or extra-axial fluid collection. No pituitary mass. No inflammatory sinus disease. No acute calvarial finding. Major vessels at the base of the brain show flow. MRI CERVICAL SPINE FINDINGS There is no abnormality at the foramen magnum, C1-2 or C2-3. C3-4: Minimal spondylosis with minimal bulging of the disc. No stenosis. C4-5: Minimal spondylosis with minimal bulging of the disc. Mild left-sided facet degeneration. No stenosis. C5-6: Shallow protrusion of disc material narrows the ventral subarachnoid space but does not compress the cord. Mild foraminal encroachment by uncovertebral osteophytes without likely compressive stenosis. C6-7:  Bulging of the disc.  No canal or foraminal stenosis. C7-T1:  Bulging of the disc.  No canal or foraminal stenosis. No abnormal cord signal. IMPRESSION: MRI brain: No acute finding. Chronic small-vessel ischemic changes of the pons. Atrophy and gliosis of the frontal lobes related to previous planum sphenoidale meningioma with resection. No evidence of residual or recurrent tumor. MRI cervical spine: Ordinary age related spondylosis. No advanced disease. No abnormality seen to explain the clinical presentation. Electronically Signed   By: MNelson ChimesM.D.   On: 02/22/2015 09:03   Dg Chest Port 1 View  02/21/2015  CLINICAL DATA:  Chest pain EXAM: PORTABLE CHEST - 1 VIEW COMPARISON:  12/26/2014 FINDINGS: Cardiac shadow  is mildly enlarged. Bibasilar atelectatic changes are seen. No focal confluent infiltrate is noted. No acute bony abnormality is seen. IMPRESSION: Minimal bibasilar atelectasis. Electronically Signed   By: MInez CatalinaM.D.   On: 02/21/2015 11:03    Microbiology: Recent Results (from the past 240 hour(s))  MRSA PCR Screening     Status: None   Collection Time: 02/22/15  5:00 AM  Result Value Ref Range Status   MRSA by PCR NEGATIVE NEGATIVE Final    Comment:        The GeneXpert MRSA Assay (FDA approved for NASAL specimens only), is one component of a comprehensive MRSA colonization surveillance program. It is not intended to diagnose MRSA infection nor to guide or monitor treatment for MRSA infections.      Labs: Basic Metabolic Panel:  Recent Labs Lab 02/21/15 1039 02/21/15 1044 02/21/15 1733 02/22/15 0457  NA 136 140  --  139  K 4.8 4.9  --  5.3*  CL 106 105  --  110  CO2 25  --   --  24  GLUCOSE 132* 124*  --  182*  BUN 34* 32*  --  27*  CREATININE 1.49* 1.50*  --  1.16*  CALCIUM 9.0  --   --  8.8*  MG  --   --  2.0  --    Liver Function Tests:  Recent Labs Lab 02/21/15 1039  AST 24  ALT 25  ALKPHOS 90  BILITOT 0.3  PROT 6.7  ALBUMIN 3.5   No results for input(s): LIPASE, AMYLASE in the last 168 hours. No results for input(s): AMMONIA in the last 168 hours. CBC:  Recent Labs Lab 02/21/15 1039 02/21/15 1044 02/22/15 0457  WBC 7.3  --  6.1  HGB 9.4* 11.2* 10.0*  HCT 31.2* 33.0* 33.4*  MCV 75.7*  --  75.9*  PLT 201  --  226   Cardiac Enzymes:  Recent Labs Lab 02/21/15 1039 02/21/15 1733 02/21/15 2249 02/22/15 0457  TROPONINI <0.03 <0.03 <0.03 <0.03   BNP: BNP (last 3 results) No results for input(s): BNP in the last 8760 hours.  ProBNP (last 3 results) No results for input(s): PROBNP in the last 8760 hours.  CBG:  Recent Labs Lab 02/22/15 0911 02/22/15 1154  GLUCAP 165* 151*       Signed:  Maude [name]  [facility] 02/22/2015, 2:02 PM

## 2015-02-22 NOTE — Progress Notes (Signed)
Patient refused CPAP for tonight after checking multiple times.

## 2015-02-22 NOTE — Progress Notes (Addendum)
Patient states understanding of discharge instructions,prescriptions given. Cock up splint applied to left wrist.

## 2015-02-23 LAB — HEMOGLOBIN A1C
HEMOGLOBIN A1C: 6.4 % — AB (ref 4.8–5.6)
Mean Plasma Glucose: 137 mg/dL

## 2015-02-24 ENCOUNTER — Other Ambulatory Visit: Payer: Self-pay

## 2015-02-25 ENCOUNTER — Inpatient Hospital Stay: Admission: RE | Admit: 2015-02-25 | Payer: Self-pay | Source: Ambulatory Visit

## 2015-02-25 ENCOUNTER — Ambulatory Visit: Payer: Self-pay | Admitting: Family Medicine

## 2015-02-25 ENCOUNTER — Other Ambulatory Visit: Payer: Self-pay

## 2015-02-25 DIAGNOSIS — R260 Ataxic gait: Secondary | ICD-10-CM | POA: Diagnosis not present

## 2015-02-25 DIAGNOSIS — E119 Type 2 diabetes mellitus without complications: Secondary | ICD-10-CM | POA: Diagnosis not present

## 2015-02-25 DIAGNOSIS — F250 Schizoaffective disorder, bipolar type: Secondary | ICD-10-CM | POA: Diagnosis not present

## 2015-02-25 DIAGNOSIS — I10 Essential (primary) hypertension: Secondary | ICD-10-CM | POA: Diagnosis not present

## 2015-02-25 DIAGNOSIS — Z794 Long term (current) use of insulin: Secondary | ICD-10-CM | POA: Diagnosis not present

## 2015-02-25 DIAGNOSIS — F17200 Nicotine dependence, unspecified, uncomplicated: Secondary | ICD-10-CM | POA: Diagnosis not present

## 2015-02-25 DIAGNOSIS — M5010 Cervical disc disorder with radiculopathy, unspecified cervical region: Secondary | ICD-10-CM | POA: Diagnosis not present

## 2015-02-25 DIAGNOSIS — I69354 Hemiplegia and hemiparesis following cerebral infarction affecting left non-dominant side: Secondary | ICD-10-CM | POA: Diagnosis not present

## 2015-02-25 DIAGNOSIS — J440 Chronic obstructive pulmonary disease with acute lower respiratory infection: Secondary | ICD-10-CM | POA: Diagnosis not present

## 2015-02-26 DIAGNOSIS — M5010 Cervical disc disorder with radiculopathy, unspecified cervical region: Secondary | ICD-10-CM | POA: Diagnosis not present

## 2015-02-26 DIAGNOSIS — R260 Ataxic gait: Secondary | ICD-10-CM | POA: Diagnosis not present

## 2015-02-26 DIAGNOSIS — J440 Chronic obstructive pulmonary disease with acute lower respiratory infection: Secondary | ICD-10-CM | POA: Diagnosis not present

## 2015-02-26 DIAGNOSIS — I69354 Hemiplegia and hemiparesis following cerebral infarction affecting left non-dominant side: Secondary | ICD-10-CM | POA: Diagnosis not present

## 2015-02-26 DIAGNOSIS — E119 Type 2 diabetes mellitus without complications: Secondary | ICD-10-CM | POA: Diagnosis not present

## 2015-02-26 DIAGNOSIS — I10 Essential (primary) hypertension: Secondary | ICD-10-CM | POA: Diagnosis not present

## 2015-02-26 DIAGNOSIS — F250 Schizoaffective disorder, bipolar type: Secondary | ICD-10-CM | POA: Diagnosis not present

## 2015-02-26 DIAGNOSIS — F17200 Nicotine dependence, unspecified, uncomplicated: Secondary | ICD-10-CM | POA: Diagnosis not present

## 2015-02-26 DIAGNOSIS — Z794 Long term (current) use of insulin: Secondary | ICD-10-CM | POA: Diagnosis not present

## 2015-03-02 ENCOUNTER — Ambulatory Visit (INDEPENDENT_AMBULATORY_CARE_PROVIDER_SITE_OTHER): Payer: Commercial Managed Care - HMO | Admitting: Family Medicine

## 2015-03-02 ENCOUNTER — Encounter: Payer: Self-pay | Admitting: Family Medicine

## 2015-03-02 VITALS — BP 120/70 | HR 73 | Resp 16 | Ht 61.0 in | Wt 155.8 lb

## 2015-03-02 DIAGNOSIS — E119 Type 2 diabetes mellitus without complications: Secondary | ICD-10-CM | POA: Diagnosis not present

## 2015-03-02 DIAGNOSIS — M5010 Cervical disc disorder with radiculopathy, unspecified cervical region: Secondary | ICD-10-CM | POA: Diagnosis not present

## 2015-03-02 DIAGNOSIS — H109 Unspecified conjunctivitis: Secondary | ICD-10-CM | POA: Diagnosis not present

## 2015-03-02 DIAGNOSIS — Z794 Long term (current) use of insulin: Secondary | ICD-10-CM | POA: Diagnosis not present

## 2015-03-02 DIAGNOSIS — F17200 Nicotine dependence, unspecified, uncomplicated: Secondary | ICD-10-CM | POA: Diagnosis not present

## 2015-03-02 DIAGNOSIS — F250 Schizoaffective disorder, bipolar type: Secondary | ICD-10-CM | POA: Diagnosis not present

## 2015-03-02 DIAGNOSIS — E669 Obesity, unspecified: Secondary | ICD-10-CM

## 2015-03-02 DIAGNOSIS — Z09 Encounter for follow-up examination after completed treatment for conditions other than malignant neoplasm: Secondary | ICD-10-CM

## 2015-03-02 DIAGNOSIS — R260 Ataxic gait: Secondary | ICD-10-CM | POA: Diagnosis not present

## 2015-03-02 DIAGNOSIS — I10 Essential (primary) hypertension: Secondary | ICD-10-CM | POA: Diagnosis not present

## 2015-03-02 DIAGNOSIS — I69359 Hemiplegia and hemiparesis following cerebral infarction affecting unspecified side: Secondary | ICD-10-CM

## 2015-03-02 DIAGNOSIS — N39498 Other specified urinary incontinence: Secondary | ICD-10-CM

## 2015-03-02 DIAGNOSIS — I69354 Hemiplegia and hemiparesis following cerebral infarction affecting left non-dominant side: Secondary | ICD-10-CM | POA: Diagnosis not present

## 2015-03-02 DIAGNOSIS — H1032 Unspecified acute conjunctivitis, left eye: Secondary | ICD-10-CM

## 2015-03-02 DIAGNOSIS — IMO0002 Reserved for concepts with insufficient information to code with codable children: Secondary | ICD-10-CM

## 2015-03-02 DIAGNOSIS — J440 Chronic obstructive pulmonary disease with acute lower respiratory infection: Secondary | ICD-10-CM | POA: Diagnosis not present

## 2015-03-02 DIAGNOSIS — E1169 Type 2 diabetes mellitus with other specified complication: Secondary | ICD-10-CM

## 2015-03-02 LAB — BASIC METABOLIC PANEL WITH GFR
BUN: 26 mg/dL — AB (ref 7–25)
CALCIUM: 9.3 mg/dL (ref 8.6–10.4)
CO2: 26 mmol/L (ref 20–31)
CREATININE: 1.23 mg/dL — AB (ref 0.50–0.99)
Chloride: 105 mmol/L (ref 98–110)
GFR, Est African American: 54 mL/min — ABNORMAL LOW (ref >=60)
GFR, Est Non African American: 46 mL/min — ABNORMAL LOW (ref >=60)
GLUCOSE: 35 mg/dL — AB (ref 65–99)
Potassium: 3.6 mmol/L (ref 3.5–5.3)
Sodium: 142 mmol/L (ref 135–146)

## 2015-03-02 MED ORDER — GENTAMICIN SULFATE 0.3 % OP SOLN: 1.0000 [drp] | Freq: Three times a day (TID) | OPHTHALMIC | Status: DC

## 2015-03-02 MED ORDER — OXYBUTYNIN CHLORIDE 5 MG PO TABS: ORAL_TABLET | ORAL | Status: DC

## 2015-03-02 NOTE — Progress Notes (Signed)
   Subjective:    Patient ID: [name], female    DOB: Mar 19, 1950, 64 y.o.   MRN: [id number]  HPI Pt in for hospital discharge follow up, she was kept overnight due to bronchitis for which she had already been on antibiotics, but she also was c/o increased upper extremity weakness. Discharged with a splint and to complete her antibiotic course. Breathing is improved, hand still weak New c/o  eye redness and draining for 1 day, and worsening incontinence Currently on high dose steroid so blood sugar will need monitoring   Review of Systems See HPI Denies recent fever or chills. Denies sinus pressure, nasal congestion, ear pain or sore throat.  Denies chest pains, palpitations and leg swelling Denies abdominal pain, nausea, vomiting,diarrhea or constipation.   . Denies uncontrolled  joint pain, swelling and limitation in mobility. Denies headaches, seizures, numbness, or tingling. Denies uncontrolled depression, anxiety or insomnia. Denies skin break down or rash.        Objective:   Physical Exam BP 120/70 mmHg  Pulse 73  Resp 16  Ht 5\' 1"  (1.549 m)  Wt 155 lb 12.8 oz (70.67 kg)  BMI 29.45 kg/m2  SpO2 98% Patient alert and oriented and in no cardiopulmonary distress.  HEENT: No facial asymmetry, EOMI,   oropharynx pink and moist.  Neck supple no JVD, no mass. Erythema and drainage from left eye Chest: decreased air entry scattered wheezes and cracklesy.  CVS: S1, S2 no murmurs, no S3.Regular rate.  ABD: Soft non tender.   Ext: No edema  MS: Adequate ROM spine, shoulders, hips and knees.  Skin: Intact, no ulcerations or rash noted.  Psych: Good eye contact, normal affect. Memory intact not anxious or depressed appearing.  CNS: CN 2-12 intact, power,  normal throughout.no focal deficits noted.       Assessment & Plan:  Hospital discharge follow-up Still cough, no recent fever or chills, symptomatic treatment only, no indication for additional antibiotic  .  Conjunctivitis of left eye 1 day h/o left eye redness and drainage  Essential hypertension Controlled, stay on current medication, check kidney function today  Hemiplegia affecting non-dominant side, post-stroke [facility]) Out patient therapy arranged while in hospital for improved strengthening  Diabetes mellitus type 2 in obese ([place]) Follow up with endo, question of erratic blood sugar levels as on steroid currently, she will follow through with this  Acute conjunctivitis of left eye Patient education provided and topical antibiotic for short term use  Urinary incontinence Uncontrolled and problematic, start medication and pt ed provided

## 2015-03-02 NOTE — Patient Instructions (Signed)
F/u in Feb as before, call if you need me sooner  Chem 7 and eGFR today  Stay on current blood pressure medication  Eye drop sent for left eye, due to acute conjunctivitis  New medication to be started for excessive wetting, also need to go to bathroom on a schedule  Bacterial Conjunctivitis Bacterial conjunctivitis (commonly called pink eye) is redness, soreness, or puffiness (inflammation) of the white part of your eye. It is caused by a germ called bacteria. These germs can easily spread from person to person (contagious). Your eye often will become red or pink. Your eye may also become irritated, watery, or have a thick discharge.  HOME CARE   Apply a cool, clean washcloth over closed eyelids. Do this for 10-20 minutes, 3-4 times a day while you have pain.  Gently wipe away any fluid coming from the eye with a warm, wet washcloth or cotton ball.  Wash your hands often with soap and water. Use paper towels to dry your hands.  Do not share towels or washcloths.  Change or wash your pillowcase every day.  Do not use eye makeup until the infection is gone.  Do not use machines or drive if your vision is blurry.  Stop using contact lenses. Do not use them again until your doctor says it is okay.  Do not touch the tip of the eye drop bottle or medicine tube with your fingers when you put medicine on the eye. GET HELP RIGHT AWAY IF:  1. Your eye is not better after 3 days of starting your medicine. 2. You have a yellowish fluid coming out of the eye. 3. You have more pain in the eye. 4. Your eye redness is spreading. 5. Your vision becomes blurry. 6. You have a fever or lasting symptoms for more than 2-3 days. 7. You have a fever and your symptoms suddenly get worse. 8. You have pain in the face. 9. Your face gets red or puffy (swollen). MAKE SURE YOU:   Understand these instructions.  Will watch this condition.  Will get help right away if you are not doing well or get  worse.   This information is not intended to replace advice given to you by your health care provider. Make sure you discuss any questions you have with your health care provider.   Document Released: 12/07/2007 Document Revised: 02/14/2012 Document Reviewed: 11/03/2011 Elsevier Interactive Patient Education 2016 Elsevier Inc.  Urinary Incontinence Urinary incontinence is the involuntary loss of urine from your bladder. CAUSES  There are many causes of urinary incontinence. They include:  Medicines.  Infections.  Prostatic enlargement, leading to overflow of urine from your bladder.  Surgery.  Neurological diseases.  Emotional factors. SIGNS AND SYMPTOMS Urinary Incontinence can be divided into four types: 10. Urge incontinence. Urge incontinence is the involuntary loss of urine before you have the opportunity to go to the bathroom. There is a sudden urge to void but not enough time to reach a bathroom. 11. Stress incontinence. Stress incontinence is the sudden loss of urine with any activity that forces urine to pass. It is commonly caused by anatomical changes to the pelvis and sphincter areas of your body. 12. Overflow incontinence. Overflow incontinence is the loss of urine from an obstructed opening to your bladder. This results in a backup of urine and a resultant buildup of pressure within the bladder. When the pressure within the bladder exceeds the closing pressure of the sphincter, the urine overflows, which causes incontinence,  similar to water overflowing a dam. 13. Total incontinence. Total incontinence is the loss of urine as a result of the inability to store urine within your bladder. DIAGNOSIS  Evaluating the cause of incontinence may require:  A thorough and complete medical and obstetric history.  A complete physical exam.  Laboratory tests such as a urine culture and sensitivities. When additional tests are indicated, they can include:  An ultrasound  exam.  Kidney and bladder X-rays.  Cystoscopy. This is an exam of the bladder using a narrow scope.  Urodynamic testing to test the nerve function to the bladder and sphincter areas. TREATMENT  Treatment for urinary incontinence depends on the cause:  For urge incontinence caused by a bacterial infection, antibiotics will be prescribed. If the urge incontinence is related to medicines you take, your health care provider may have you change the medicine.  For stress incontinence, surgery to re-establish anatomical support to the bladder or sphincter, or both, will often correct the condition.  For overflow incontinence caused by an enlarged prostate, an operation to open the channel through the enlarged prostate will allow the flow of urine out of the bladder. In women with fibroids, a hysterectomy may be recommended.  For total incontinence, surgery on your urinary sphincter may help. An artificial urinary sphincter (an inflatable cuff placed around the urethra) may be required. In women who have developed a hole-like passage between their bladder and vagina (vesicovaginal fistula), surgery to close the fistula often is required. HOME CARE INSTRUCTIONS  Normal daily hygiene and the use of pads or adult diapers that are changed regularly will help prevent odors and skin damage.  Avoid caffeine. It can overstimulate your bladder.  Use the bathroom regularly. Try about every 2-3 hours to go to the bathroom, even if you do not feel the need to do so. Take time to empty your bladder completely. After urinating, wait a minute. Then try to urinate again.  For causes involving nerve dysfunction, keep a log of the medicines you take and a journal of the times you go to the bathroom. SEEK MEDICAL CARE IF:  You experience worsening of pain instead of improvement in pain after your procedure.  Your incontinence becomes worse instead of better. SEE IMMEDIATE MEDICAL CARE IF:  You experience fever  or shaking chills.  You are unable to pass your urine.  You have redness spreading into your groin or down into your thighs. MAKE SURE YOU:   Understand these instructions.   Will watch your condition.  Will get help right away if you are not doing well or get worse.   This information is not intended to replace advice given to you by your health care provider. Make sure you discuss any questions you have with your health care provider.   Document Released: 04/06/2004 Document Revised: 03/20/2014 Document Reviewed: 08/06/2012 Elsevier Interactive Patient Education [employer].

## 2015-03-02 NOTE — Assessment & Plan Note (Signed)
1 day h/o left eye redness and drainage

## 2015-03-02 NOTE — Assessment & Plan Note (Addendum)
Still cough, no recent fever or chills, symptomatic treatment only, no indication for additional antibiotic .

## 2015-03-03 ENCOUNTER — Other Ambulatory Visit: Payer: Self-pay | Admitting: *Deleted

## 2015-03-03 ENCOUNTER — Telehealth: Payer: Self-pay

## 2015-03-03 NOTE — Patient Outreach (Signed)
Call to patient in response to referral to [place] with patient, discussed referral to [facility] CM community Due to patient appointments and holidays, unable to schedule outreach visit until 03/18/15. Gave patient RNCM contact for questions or concerns. Royetta Crochet. Laymond Purser, RN, BSN, [place] [phone]

## 2015-03-03 NOTE — Telephone Encounter (Signed)
pkls peak directly to pt and explain how impt it is top eat on regular schedule

## 2015-03-03 NOTE — Telephone Encounter (Signed)
Spoke with patient.

## 2015-03-04 DIAGNOSIS — F17200 Nicotine dependence, unspecified, uncomplicated: Secondary | ICD-10-CM | POA: Diagnosis not present

## 2015-03-04 DIAGNOSIS — R260 Ataxic gait: Secondary | ICD-10-CM | POA: Diagnosis not present

## 2015-03-04 DIAGNOSIS — Z794 Long term (current) use of insulin: Secondary | ICD-10-CM | POA: Diagnosis not present

## 2015-03-04 DIAGNOSIS — I10 Essential (primary) hypertension: Secondary | ICD-10-CM | POA: Diagnosis not present

## 2015-03-04 DIAGNOSIS — M5010 Cervical disc disorder with radiculopathy, unspecified cervical region: Secondary | ICD-10-CM | POA: Diagnosis not present

## 2015-03-04 DIAGNOSIS — F250 Schizoaffective disorder, bipolar type: Secondary | ICD-10-CM | POA: Diagnosis not present

## 2015-03-04 DIAGNOSIS — E119 Type 2 diabetes mellitus without complications: Secondary | ICD-10-CM | POA: Diagnosis not present

## 2015-03-04 DIAGNOSIS — I69354 Hemiplegia and hemiparesis following cerebral infarction affecting left non-dominant side: Secondary | ICD-10-CM | POA: Diagnosis not present

## 2015-03-04 DIAGNOSIS — J440 Chronic obstructive pulmonary disease with acute lower respiratory infection: Secondary | ICD-10-CM | POA: Diagnosis not present

## 2015-03-05 DIAGNOSIS — I10 Essential (primary) hypertension: Secondary | ICD-10-CM | POA: Diagnosis not present

## 2015-03-05 DIAGNOSIS — Z794 Long term (current) use of insulin: Secondary | ICD-10-CM | POA: Diagnosis not present

## 2015-03-05 DIAGNOSIS — R260 Ataxic gait: Secondary | ICD-10-CM | POA: Diagnosis not present

## 2015-03-05 DIAGNOSIS — J440 Chronic obstructive pulmonary disease with acute lower respiratory infection: Secondary | ICD-10-CM | POA: Diagnosis not present

## 2015-03-05 DIAGNOSIS — F17200 Nicotine dependence, unspecified, uncomplicated: Secondary | ICD-10-CM | POA: Diagnosis not present

## 2015-03-05 DIAGNOSIS — E119 Type 2 diabetes mellitus without complications: Secondary | ICD-10-CM | POA: Diagnosis not present

## 2015-03-05 DIAGNOSIS — M5010 Cervical disc disorder with radiculopathy, unspecified cervical region: Secondary | ICD-10-CM | POA: Diagnosis not present

## 2015-03-05 DIAGNOSIS — F250 Schizoaffective disorder, bipolar type: Secondary | ICD-10-CM | POA: Diagnosis not present

## 2015-03-05 DIAGNOSIS — I69354 Hemiplegia and hemiparesis following cerebral infarction affecting left non-dominant side: Secondary | ICD-10-CM | POA: Diagnosis not present

## 2015-03-08 DIAGNOSIS — M79609 Pain in unspecified limb: Secondary | ICD-10-CM | POA: Diagnosis not present

## 2015-03-10 ENCOUNTER — Ambulatory Visit
Admission: RE | Admit: 2015-03-10 | Discharge: 2015-03-10 | Disposition: A | Discharge status: Home / Self Care | Payer: Commercial Managed Care - HMO | Source: Ambulatory Visit | Attending: Internal Medicine | Admitting: Internal Medicine

## 2015-03-10 DIAGNOSIS — R260 Ataxic gait: Secondary | ICD-10-CM | POA: Diagnosis not present

## 2015-03-10 DIAGNOSIS — N816 Rectocele: Secondary | ICD-10-CM | POA: Diagnosis not present

## 2015-03-10 DIAGNOSIS — I69354 Hemiplegia and hemiparesis following cerebral infarction affecting left non-dominant side: Secondary | ICD-10-CM | POA: Diagnosis not present

## 2015-03-10 DIAGNOSIS — M5010 Cervical disc disorder with radiculopathy, unspecified cervical region: Secondary | ICD-10-CM | POA: Diagnosis not present

## 2015-03-10 DIAGNOSIS — J440 Chronic obstructive pulmonary disease with acute lower respiratory infection: Secondary | ICD-10-CM | POA: Diagnosis not present

## 2015-03-10 DIAGNOSIS — I10 Essential (primary) hypertension: Secondary | ICD-10-CM | POA: Diagnosis not present

## 2015-03-10 DIAGNOSIS — E119 Type 2 diabetes mellitus without complications: Secondary | ICD-10-CM | POA: Diagnosis not present

## 2015-03-10 DIAGNOSIS — F17200 Nicotine dependence, unspecified, uncomplicated: Secondary | ICD-10-CM | POA: Diagnosis not present

## 2015-03-10 DIAGNOSIS — Z794 Long term (current) use of insulin: Secondary | ICD-10-CM | POA: Diagnosis not present

## 2015-03-10 DIAGNOSIS — F250 Schizoaffective disorder, bipolar type: Secondary | ICD-10-CM | POA: Diagnosis not present

## 2015-03-11 ENCOUNTER — Ambulatory Visit: Payer: Self-pay | Admitting: Internal Medicine

## 2015-03-11 DIAGNOSIS — I69354 Hemiplegia and hemiparesis following cerebral infarction affecting left non-dominant side: Secondary | ICD-10-CM | POA: Diagnosis not present

## 2015-03-11 DIAGNOSIS — F250 Schizoaffective disorder, bipolar type: Secondary | ICD-10-CM | POA: Diagnosis not present

## 2015-03-11 DIAGNOSIS — E119 Type 2 diabetes mellitus without complications: Secondary | ICD-10-CM | POA: Diagnosis not present

## 2015-03-11 DIAGNOSIS — F17200 Nicotine dependence, unspecified, uncomplicated: Secondary | ICD-10-CM | POA: Diagnosis not present

## 2015-03-11 DIAGNOSIS — Z794 Long term (current) use of insulin: Secondary | ICD-10-CM | POA: Diagnosis not present

## 2015-03-11 DIAGNOSIS — I10 Essential (primary) hypertension: Secondary | ICD-10-CM | POA: Diagnosis not present

## 2015-03-11 DIAGNOSIS — R260 Ataxic gait: Secondary | ICD-10-CM | POA: Diagnosis not present

## 2015-03-11 DIAGNOSIS — J440 Chronic obstructive pulmonary disease with acute lower respiratory infection: Secondary | ICD-10-CM | POA: Diagnosis not present

## 2015-03-11 DIAGNOSIS — M5010 Cervical disc disorder with radiculopathy, unspecified cervical region: Secondary | ICD-10-CM | POA: Diagnosis not present

## 2015-03-16 ENCOUNTER — Telehealth: Payer: Self-pay | Admitting: Family Medicine

## 2015-03-16 ENCOUNTER — Other Ambulatory Visit: Payer: Self-pay

## 2015-03-16 ENCOUNTER — Other Ambulatory Visit: Payer: Self-pay | Admitting: Neurology

## 2015-03-16 DIAGNOSIS — Z794 Long term (current) use of insulin: Secondary | ICD-10-CM | POA: Diagnosis not present

## 2015-03-16 DIAGNOSIS — I10 Essential (primary) hypertension: Secondary | ICD-10-CM | POA: Diagnosis not present

## 2015-03-16 DIAGNOSIS — F17200 Nicotine dependence, unspecified, uncomplicated: Secondary | ICD-10-CM | POA: Diagnosis not present

## 2015-03-16 DIAGNOSIS — F250 Schizoaffective disorder, bipolar type: Secondary | ICD-10-CM | POA: Diagnosis not present

## 2015-03-16 DIAGNOSIS — I69354 Hemiplegia and hemiparesis following cerebral infarction affecting left non-dominant side: Secondary | ICD-10-CM | POA: Diagnosis not present

## 2015-03-16 DIAGNOSIS — J440 Chronic obstructive pulmonary disease with acute lower respiratory infection: Secondary | ICD-10-CM | POA: Diagnosis not present

## 2015-03-16 DIAGNOSIS — M5010 Cervical disc disorder with radiculopathy, unspecified cervical region: Secondary | ICD-10-CM | POA: Diagnosis not present

## 2015-03-16 DIAGNOSIS — E119 Type 2 diabetes mellitus without complications: Secondary | ICD-10-CM | POA: Diagnosis not present

## 2015-03-16 DIAGNOSIS — R260 Ataxic gait: Secondary | ICD-10-CM | POA: Diagnosis not present

## 2015-03-16 MED ORDER — DICYCLOMINE HCL 10 MG PO CAPS: ORAL_CAPSULE | ORAL | Status: DC

## 2015-03-16 NOTE — Telephone Encounter (Signed)
[name] left a voicemail requesting that [name] call in her something for nausea, she states that she is staying nauseated, please advise?

## 2015-03-16 NOTE — Telephone Encounter (Signed)
Last OV: 02/08/15 Next OV: 06/28/15

## 2015-03-17 ENCOUNTER — Other Ambulatory Visit: Payer: Self-pay

## 2015-03-17 ENCOUNTER — Telehealth: Payer: Self-pay | Admitting: Family Medicine

## 2015-03-17 DIAGNOSIS — IMO0002 Reserved for concepts with insufficient information to code with codable children: Secondary | ICD-10-CM

## 2015-03-17 MED ORDER — PROMETHAZINE-DM 6.25-15 MG/5ML PO SYRP: ORAL_SOLUTION | ORAL | Status: DC

## 2015-03-17 NOTE — Telephone Encounter (Signed)
Noted.   Patient is also complaining of loss of grip in left hand.   Is it ok to refer to current home health agency ([facility]) for therapy with this?

## 2015-03-17 NOTE — Telephone Encounter (Signed)
Patient is asking for anti nausea medication.  Should this come from GI?   She is also asking if you will review recent Pelvic MRI.   Also complaining of residual cough from recent sickness.  Is taking cough med.  Is there any other advice.

## 2015-03-17 NOTE — Telephone Encounter (Signed)
OK OT for left hand weakness twice weekly for  6 weeks, document also duration of her symptoms

## 2015-03-17 NOTE — Telephone Encounter (Signed)
[name] Blood Pressure yesterday 174/80 and has a pain of 6 out 10 with back pain

## 2015-03-17 NOTE — Telephone Encounter (Signed)
GI to refill the  nausea med  I have sent in an additional refill on phenergan DM , she is nOT to expect to keep taking cough med, and explain that a lot of respiratory illness leaves people with a residual cough for several weeks

## 2015-03-17 NOTE — Telephone Encounter (Signed)
Called and left message for patient to contact GI about nausea.  She should call back if she is in need of more cough medication.

## 2015-03-18 ENCOUNTER — Ambulatory Visit: Payer: Self-pay | Admitting: *Deleted

## 2015-03-18 ENCOUNTER — Other Ambulatory Visit: Payer: Self-pay | Admitting: *Deleted

## 2015-03-18 DIAGNOSIS — M5010 Cervical disc disorder with radiculopathy, unspecified cervical region: Secondary | ICD-10-CM | POA: Diagnosis not present

## 2015-03-18 DIAGNOSIS — J440 Chronic obstructive pulmonary disease with acute lower respiratory infection: Secondary | ICD-10-CM | POA: Diagnosis not present

## 2015-03-18 DIAGNOSIS — E119 Type 2 diabetes mellitus without complications: Secondary | ICD-10-CM | POA: Diagnosis not present

## 2015-03-18 DIAGNOSIS — Z794 Long term (current) use of insulin: Secondary | ICD-10-CM | POA: Diagnosis not present

## 2015-03-18 DIAGNOSIS — I69354 Hemiplegia and hemiparesis following cerebral infarction affecting left non-dominant side: Secondary | ICD-10-CM | POA: Diagnosis not present

## 2015-03-18 DIAGNOSIS — R260 Ataxic gait: Secondary | ICD-10-CM | POA: Diagnosis not present

## 2015-03-18 DIAGNOSIS — F17200 Nicotine dependence, unspecified, uncomplicated: Secondary | ICD-10-CM | POA: Diagnosis not present

## 2015-03-18 DIAGNOSIS — I10 Essential (primary) hypertension: Secondary | ICD-10-CM | POA: Diagnosis not present

## 2015-03-18 DIAGNOSIS — F250 Schizoaffective disorder, bipolar type: Secondary | ICD-10-CM | POA: Diagnosis not present

## 2015-03-18 NOTE — Telephone Encounter (Signed)
Orders written and awaiting signature.

## 2015-03-18 NOTE — Telephone Encounter (Signed)
Patient states that symptoms started at her last hospital admission.

## 2015-03-18 NOTE — Patient Outreach (Signed)
Arrived per scheduled appointment. Call to patient as requested before home visit so she could open Apartment complex door as she she lives in secured apartment complex. Patient states she forgot visit and had planned to run an errand in a few minutes. Requested RNCM reschedule visit. Rescheduled visit for 03/19/15 Plan to make initial visit 03/19/15. Royetta Crochet. Laymond Purser, RN, BSN, [place] [phone]

## 2015-03-19 ENCOUNTER — Other Ambulatory Visit: Payer: Self-pay | Admitting: *Deleted

## 2015-03-19 ENCOUNTER — Encounter: Payer: Self-pay | Admitting: *Deleted

## 2015-03-19 DIAGNOSIS — Z794 Long term (current) use of insulin: Secondary | ICD-10-CM | POA: Diagnosis not present

## 2015-03-19 DIAGNOSIS — M5010 Cervical disc disorder with radiculopathy, unspecified cervical region: Secondary | ICD-10-CM | POA: Diagnosis not present

## 2015-03-19 DIAGNOSIS — J440 Chronic obstructive pulmonary disease with acute lower respiratory infection: Secondary | ICD-10-CM | POA: Diagnosis not present

## 2015-03-19 DIAGNOSIS — I10 Essential (primary) hypertension: Secondary | ICD-10-CM | POA: Diagnosis not present

## 2015-03-19 DIAGNOSIS — F250 Schizoaffective disorder, bipolar type: Secondary | ICD-10-CM | POA: Diagnosis not present

## 2015-03-19 DIAGNOSIS — I69354 Hemiplegia and hemiparesis following cerebral infarction affecting left non-dominant side: Secondary | ICD-10-CM | POA: Diagnosis not present

## 2015-03-19 DIAGNOSIS — F17200 Nicotine dependence, unspecified, uncomplicated: Secondary | ICD-10-CM | POA: Diagnosis not present

## 2015-03-19 DIAGNOSIS — E119 Type 2 diabetes mellitus without complications: Secondary | ICD-10-CM | POA: Diagnosis not present

## 2015-03-19 DIAGNOSIS — R260 Ataxic gait: Secondary | ICD-10-CM | POA: Diagnosis not present

## 2015-03-19 NOTE — Patient Outreach (Addendum)
[facility]) Care Management   03/19/2015  Peola Joynt Maines [birth year] [id number]  Tristyn Pharris Voght is an 65 y.o. female  Visit with patient, [place] aide present, [place] OT present  Subjective:   "I have headache" patient [place], checked BP earlier today and found BP was elevated, called MD for direction, they wanted patient to restart BP medication. Medication boxes filled  Patient reports CBG this am was 92.  Patient states [place] RN fills med box weekly. Patient confirms she has [place] services, [facility], PT/OT, Aide  Patient has transportation through [facility] to appointments  Patient has dental appointment Monday, but may need to cancel due to bad weather forecasted   Objective:   BP 160/68 mmHg  Pulse 80  Resp 20  Ht 1.549 m ('5\' 1"'$ )  Wt 155 lb (70.308 kg)  BMI 29.30 kg/m2  SpO2 98%  Apartment clean, Patient neatly groomed and dressed Review of Systems  Constitutional: Negative.   HENT: Negative.   Respiratory: Negative.   Cardiovascular: Negative.   Musculoskeletal: Positive for joint pain.  Skin: Negative.   Neurological: Negative.   Endo/Heme/Allergies: Negative.   Psychiatric/Behavioral: Positive for depression.    Physical Exam  Constitutional: She is oriented to person, place, and time. She appears well-developed and well-nourished.  Neck: Normal range of motion.  Cardiovascular: Normal rate and regular rhythm.   Respiratory: Effort normal.  GI: Soft. Bowel sounds are normal.  Musculoskeletal: She exhibits edema.  Feet, but not pitting  Neurological: She is alert and oriented to person, place, and time.  Skin: Skin is warm and dry.    Current Medications:   Current Outpatient Prescriptions  Medication Sig Dispense Refill  . ACCU-CHEK FASTCLIX LANCETS MISC Use to test blood sugar 4 times daily. Dx: E10.65 408 each 1  . Alcohol Swabs (B-D SINGLE USE SWABS REGULAR) PADS Use for injections and glucose testing 8 times daily. Dx: E10.65 900 each 1  .  amLODipine (NORVASC) 5 MG tablet Take 1 tablet (5 mg total) by mouth daily. 30 tablet 3  . aspirin EC 81 MG tablet Take 81 mg by mouth daily.    . benzonatate (TESSALON) 100 MG capsule Take 1 capsule (100 mg total) by mouth 3 (three) times daily. 30 capsule 0  . Blood Glucose Calibration (ACCU-CHEK SMARTVIEW CONTROL) LIQD 1 each by Other route as needed. 1 each 2  . Blood Glucose Monitoring Suppl (ACCU-CHEK NANO SMARTVIEW) W/DEVICE KIT 1 each by Does not apply route daily. Dx: E10.65 1 kit 0  . Cholecalciferol (D 5000) 5000 UNITS capsule Take 5,000 Units by mouth daily.    . clobetasol cream (TEMOVATE) 7.03 % Apply 1 application topically daily.    . cloNIDine (CATAPRES) 0.3 MG tablet TAKE 1 TABLET BY MOUTH EVERY EIGHT HOURS. ONCE AT 8AM, 4PM, AND 12 MIDNIGHT. 90 tablet 3  . diclofenac sodium (VOLTAREN) 1 % GEL Apply 2 g topically daily as needed (Pain). 100 g 0  . dicyclomine (BENTYL) 10 MG capsule TAKE 1 CAPSULE BY M OUTH THREE TIMES DAILY BEFORE MEALS. 90 capsule 3  . docusate sodium (COLACE) 100 MG capsule Take 2 capsules (200 mg total) by mouth at bedtime. 30 capsule 0  . fluticasone (FLONASE) 50 MCG/ACT nasal spray Place 2 sprays into both nostrils daily.    [name] gentamicin (GARAMYCIN) 0.3 % ophthalmic solution Place 1 drop into the left eye 3 (three) times daily. 5 mL 0  . glucose blood (ACCU-CHEK SMARTVIEW) test strip Use to test blood  sugar 4 times daily as instructed. Dx: E10.65 375 each 1  . HYDROcodone-acetaminophen (NORCO/VICODIN) 5-325 MG tablet Take 1 tablet by mouth 2 (two) times daily. 14 tablet 0  . hydroxychloroquine (PLAQUENIL) 200 MG tablet Take 200 mg by mouth daily.    . insulin aspart (NOVOLOG FLEXPEN) 100 UNIT/ML FlexPen Inject 20-22 Units into the skin 3 (three) times daily with meals. 30 mL 1  . Insulin Glargine (TOUJEO SOLOSTAR) 300 UNIT/ML SOPN Inject 55 Units into the skin at bedtime. (Patient taking differently: Inject 50 Units into the skin at bedtime. ) 6 pen 1  .  Insulin Pen Needle (CLICKFINE PEN NEEDLES) 32G X 4 MM MISC Use 4x a day 300 each 3  . lamoTRIgine (LAMICTAL) 100 MG tablet TAKE 1 TABLET BY MOUTH TWICE DAILY. 60 tablet 5  . levothyroxine (SYNTHROID, LEVOTHROID) 50 MCG tablet TAKE 1 TABLET BY MOUTH ONCE DAILY AND 1/2 TABLET ON SUNDAYS. (Patient taking differently: Take 25-50 mcg by mouth daily. TAKE 1 TABLET BY MOUTH ONCE DAILY AND 1/2 TABLET ON SUNDAYS.) 30 tablet 2  . LORazepam (ATIVAN) 0.5 MG tablet Take 1 tablet (0.5 mg total) by mouth 3 (three) times daily. 90 tablet 2  . methocarbamol (ROBAXIN) 500 MG tablet Take 500 mg by mouth daily.    . metoprolol (LOPRESSOR) 50 MG tablet TAKE 1 TABLET BY MOUTH TWICE DAILY. 60 tablet 6  . montelukast (SINGULAIR) 10 MG tablet Take 1 tablet (10 mg total) by mouth daily. 30 tablet 2  . Multiple Vitamins-Minerals (ONE-A-DAY 50 PLUS PO) Take 1 tablet by mouth daily.    . pregabalin (LYRICA) 75 MG capsule Take 75 mg by mouth 2 (two) times daily.     . promethazine-dextromethorphan (PROMETHAZINE-DM) 6.25-15 MG/5ML syrup One teaspoon at bedtime as needed , for excess cough 180 mL 0  . RABEprazole (ACIPHEX) 20 MG tablet TAKE 1 TABLET BY MOUTH TWICE DAILY. 60 tablet 3  . RESTASIS 0.05 % ophthalmic emulsion Place 1 drop into both eyes 2 (two) times daily.     . risperiDONE (RISPERDAL) 0.5 MG tablet Take 1 tablet (0.5 mg total) by mouth at bedtime. 30 tablet 2  . rosuvastatin (CRESTOR) 5 MG tablet TAKE 1 TABLET BY MOUTH AT BEDTIME. 30 tablet 4  . sertraline (ZOLOFT) 100 MG tablet Take 2 tablets (200 mg total) by mouth daily. 60 tablet 2  . traZODone (DESYREL) 150 MG tablet Take 2 tablets (300 mg total) by mouth at bedtime. 60 tablet 2  . vitamin E 400 UNIT capsule Take 400 Units by mouth daily.    [name] ZETIA 10 MG tablet TAKE ONE TABLET BY MOUTH ONCE DAILY. 30 tablet 4  . albuterol (PROVENTIL HFA;VENTOLIN HFA) 108 (90 BASE) MCG/ACT inhaler Inhale 1 puff into the lungs 3 (three) times daily. 1 Inhaler 2  . azithromycin  (ZITHROMAX) 250 MG tablet Two tablets on day one, then one daily for an additional four days (Patient not taking: Reported on 03/19/2015) 6 tablet 0  . lubiprostone (AMITIZA) 8 MCG capsule Take 1 capsule (8 mcg total) by mouth daily after breakfast. (Patient not taking: Reported on 03/19/2015) 20 capsule 0  . Nystatin (NYAMYC) 100000 UNIT/GM POWD APPLY TO AFFECTED AREA 4 TIMES DAILY. (Patient not taking: Reported on 03/19/2015) 30 g 2  . oxybutynin (DITROPAN) 5 MG tablet One tablet once daily (Patient not taking: Reported on 03/19/2015) 30 tablet 3  . predniSONE (DELTASONE) 5 MG tablet Take 1 tablet (5 mg total) by mouth 2 (two) times daily with a  meal. (Patient not taking: Reported on 03/19/2015) 4 tablet 0  . promethazine (PHENERGAN) 12.5 MG tablet Take 1 tablet (12.5 mg total) by mouth every 6 (six) hours as needed for nausea or vomiting. (Patient not taking: Reported on 03/19/2015) 30 tablet 0  . promethazine-dextromethorphan (PROMETHAZINE-DM) 6.25-15 MG/5ML syrup One teaspoon at bedtime, as needed, for excessive cough (Patient not taking: Reported on 03/19/2015) 118 mL 0   No current facility-administered medications for this visit.    Functional Status:   In your present state of health, do you have any difficulty performing the following activities: 03/19/2015 02/21/2015  Hearing? N N  Vision? Y N  Difficulty concentrating or making decisions? N N  Walking or climbing stairs? Y Y  Dressing or bathing? Y Y  Doing errands, shopping? Tempie Donning    Fall/Depression Screening:    Fall Risk  03/19/2015 02/10/2015 02/03/2015 01/05/2015 07/27/2014  Falls in the past year? Yes Yes Yes Yes Yes  Number falls in past yr: 2 or more 2 or more 2 or more 2 or more 2 or more  Injury with Fall? Yes Yes Yes Yes Yes  Risk Factor Category  High Fall Risk High Fall Risk High Fall Risk High Fall Risk -  Risk for fall due to : History of fall(s);Impaired balance/gait;Impaired mobility Impaired mobility;Impaired balance/gait - - -   Follow up Falls evaluation completed Falls prevention discussed Falls evaluation completed - -   PHQ 2/9 Scores 03/19/2015 02/10/2015  PHQ - 2 Score 2 0  PHQ- 9 Score 9 -    Assessment:   Diabetes-educate patient on her diet and CBG monitoring Fall risk-educate on fall prevention HTN-HH RN, Awilda Metro,  called MD to restart BP medication  Plan:  [facility] CM Care Plan Problem One        Most Recent Value   Care Plan Problem One  Diabetes-knowledge deficit    Role Documenting the Problem One  Care Management Coordinator   Care Plan for Problem One  Active   THN Long Term Goal (31-90 days)  Patient will be able to verbalize signs and symptoms of low blood sugar within the next 21 days    THN Long Term Goal Start Date  03/19/15   Interventions for Problem One Long Term Goal  RNCM gave [facility] calendar and reviewed diabetes tab. Discussed diabetes protocol using teachback    THN CM Care Plan Problem Two        Most Recent Value   Care Plan Problem Two  High risk for fall as evidenced by falls over past year   Role Documenting the Problem Two  Care Management [place] for Problem Two  Active   Interventions for Problem Two Long Term Goal   Fall risk assessment completed   THN Long Term Goal (31-90) days  Patient will not have any falls over the next 31 days   THN Long Term Goal Start Date  03/19/15   THN CM Short Term Goal #1 (0-30 days)  Patient will continue with her PT and OT services over the nex 21 days   THN CM Short Term Goal #1 Start Date  03/19/15   Interventions for Short Term Goal #2   RNCM encouraged patient to use assistive devices as directed by PT/OT.      Patient has [place] services in place, aware to call [facility] for any acute problems Open case today. Monitor for ongoing [facility] program case management needs Visit in February if  needed  Fax MD initial visit   Royetta Crochet. Laymond Purser, RN, BSN, [place] [phone]

## 2015-03-23 DIAGNOSIS — J440 Chronic obstructive pulmonary disease with acute lower respiratory infection: Secondary | ICD-10-CM | POA: Diagnosis not present

## 2015-03-23 DIAGNOSIS — I69354 Hemiplegia and hemiparesis following cerebral infarction affecting left non-dominant side: Secondary | ICD-10-CM | POA: Diagnosis not present

## 2015-03-23 DIAGNOSIS — E119 Type 2 diabetes mellitus without complications: Secondary | ICD-10-CM | POA: Diagnosis not present

## 2015-03-23 DIAGNOSIS — Z794 Long term (current) use of insulin: Secondary | ICD-10-CM | POA: Diagnosis not present

## 2015-03-23 DIAGNOSIS — R260 Ataxic gait: Secondary | ICD-10-CM | POA: Diagnosis not present

## 2015-03-23 DIAGNOSIS — F17200 Nicotine dependence, unspecified, uncomplicated: Secondary | ICD-10-CM | POA: Diagnosis not present

## 2015-03-23 DIAGNOSIS — I10 Essential (primary) hypertension: Secondary | ICD-10-CM | POA: Diagnosis not present

## 2015-03-23 DIAGNOSIS — F250 Schizoaffective disorder, bipolar type: Secondary | ICD-10-CM | POA: Diagnosis not present

## 2015-03-23 DIAGNOSIS — M5010 Cervical disc disorder with radiculopathy, unspecified cervical region: Secondary | ICD-10-CM | POA: Diagnosis not present

## 2015-03-25 ENCOUNTER — Ambulatory Visit (INDEPENDENT_AMBULATORY_CARE_PROVIDER_SITE_OTHER): Payer: Commercial Managed Care - HMO | Admitting: Internal Medicine

## 2015-03-25 ENCOUNTER — Encounter: Payer: Self-pay | Admitting: Internal Medicine

## 2015-03-25 VITALS — BP 112/60 | HR 66 | Temp 98.5°F | Resp 12 | Wt 159.8 lb

## 2015-03-25 DIAGNOSIS — F250 Schizoaffective disorder, bipolar type: Secondary | ICD-10-CM | POA: Diagnosis not present

## 2015-03-25 DIAGNOSIS — IMO0002 Reserved for concepts with insufficient information to code with codable children: Secondary | ICD-10-CM

## 2015-03-25 DIAGNOSIS — J440 Chronic obstructive pulmonary disease with acute lower respiratory infection: Secondary | ICD-10-CM | POA: Diagnosis not present

## 2015-03-25 DIAGNOSIS — N182 Chronic kidney disease, stage 2 (mild): Secondary | ICD-10-CM

## 2015-03-25 DIAGNOSIS — I69354 Hemiplegia and hemiparesis following cerebral infarction affecting left non-dominant side: Secondary | ICD-10-CM | POA: Diagnosis not present

## 2015-03-25 DIAGNOSIS — R260 Ataxic gait: Secondary | ICD-10-CM | POA: Diagnosis not present

## 2015-03-25 DIAGNOSIS — Z794 Long term (current) use of insulin: Secondary | ICD-10-CM | POA: Diagnosis not present

## 2015-03-25 DIAGNOSIS — E039 Hypothyroidism, unspecified: Secondary | ICD-10-CM | POA: Diagnosis not present

## 2015-03-25 DIAGNOSIS — E119 Type 2 diabetes mellitus without complications: Secondary | ICD-10-CM | POA: Diagnosis not present

## 2015-03-25 DIAGNOSIS — F17200 Nicotine dependence, unspecified, uncomplicated: Secondary | ICD-10-CM | POA: Diagnosis not present

## 2015-03-25 DIAGNOSIS — E1022 Type 1 diabetes mellitus with diabetic chronic kidney disease: Secondary | ICD-10-CM

## 2015-03-25 DIAGNOSIS — I10 Essential (primary) hypertension: Secondary | ICD-10-CM | POA: Diagnosis not present

## 2015-03-25 DIAGNOSIS — E042 Nontoxic multinodular goiter: Secondary | ICD-10-CM | POA: Diagnosis not present

## 2015-03-25 DIAGNOSIS — E1065 Type 1 diabetes mellitus with hyperglycemia: Secondary | ICD-10-CM

## 2015-03-25 DIAGNOSIS — M5010 Cervical disc disorder with radiculopathy, unspecified cervical region: Secondary | ICD-10-CM | POA: Diagnosis not present

## 2015-03-25 MED ORDER — INSULIN GLARGINE 300 UNIT/ML ~~LOC~~ SOPN: 35.0000 [IU] | PEN_INJECTOR | Freq: Every day | SUBCUTANEOUS | Status: DC

## 2015-03-25 MED ORDER — INSULIN ASPART 100 UNIT/ML FLEXPEN: 14.0000 [IU] | PEN_INJECTOR | Freq: Three times a day (TID) | SUBCUTANEOUS | Status: DC

## 2015-03-25 NOTE — Patient Instructions (Signed)
Please decrease: - Toujeo to 35 units at bedtime - Novolog:  14 units with a smaller meal  18 units with a larger meal  Continue Levothyroxine 50 mcg daily.  Take the thyroid hormone every day, with water, at least 30 minutes before breakfast, separated by at least 4 hours from: - acid reflux medications - calcium - iron - multivitamins  Please return in 1.5 months with your sugar log.

## 2015-03-25 NOTE — Progress Notes (Signed)
Patient ID: [name], female   DOB: 09-21-1950, 65 y.o.   MRN: [id number]  HPI: [name] is a 65 y.o.-year-old female, initially referred by her PCP, Dr. Moshe [name], for management of DM2, dx 1995, insulin-dependent since 1997, uncontrolled, with complications (gastroparesis, cerebrovasc. Ds-  H/o stroke, PN),  Hypothyroidism, thyroid nodules.  Last visit 3 mo ago.  She had a L rib fracture in 12/2014.  DM2: Last hemoglobin A1c was: Lab Results  Component Value Date   HGBA1C 6.4* 02/21/2015   HGBA1C 6.2 12/22/2014   HGBA1C 7.0* 09/21/2014  10/28/2013: HbA1c 8.5%  She is now on: - Toujeo 55 >> 50 units at bedtime - Novolog:  20 units before a regular meal  22 units before a large meal  Could not tolerate Metformin >> nausea.   Pt checks her sugars 4 a day and they are lower - am: 68-142, 164 >> 89-139, 161 >> 88-147, 184 >> 98, 100-222, 310 >> 81-128, 166 >> 36, 41, 52-100, 122 - 2h after b'fast: n/c  - before lunch: 90, 99-150, 198, 288 (forgot insulin) >> 84, 96-219, 309 >> 71-131, 170, 261 >> 36, 39, 65-117, 227 - 2h after lunch: n/c - before dinner:91, 100-201 >> 96, 125-201, 260 >> 98-301, 398 >> 66, 81-150, 170 >> 47-133, 243 - 2h after dinner: n/c - bedtime:  72, 94-210 >> 90, 103-198 >> 101-193 >> 93, 160-218, 290, 398 >> 98-175 >> 54-150, 269 - nighttime: n/c No lows. Lowest sugar was 69 >> 88x1 >> 84 x1 >> 36; she has hypoglycemia awareness at 70.  Highest sugar was 200s >> 390s >> 200s  Pt's meals are: - Breakfast: eggs, cereals, fruit, oatmeal, bacon - Lunch: sandwich, beef, mashed potatoes,diet soda - Dinner: chicken, beef, fish, potatoes, vegetables - Snacks: 1-2: fruit  She exercises 1x a day - walks.  - + mild CKD, last BUN/creatinine:  Lab Results  Component Value Date   BUN 26* 03/02/2015   CREATININE 1.23* 03/02/2015  On Losartan. - last set of lipids: Lab Results  Component Value Date   CHOL 96 02/21/2015   HDL 31* 02/21/2015   LDLCALC 35  02/21/2015   TRIG 150* 02/21/2015   CHOLHDL 3.1 02/21/2015  On Ezetimibe + Crestor. - last eye exam was in 10/06/2014. No DR. Had cataract sx B. - + numbness and tingling in her leg L leg (affected by stroke) but also in R. Sees podiatry.  Hypothyroidism: - dx > 10 years ago. - On Levoxyl 50 mcg 6/7 days and 25 1/7 days.  - in am (at 7 am) - fasting - with water - >30 min b'f b'fast - no Ca - no Fe - + MVI at noon - + Aciphex moved at noon  Last TSH: Lab Results  Component Value Date   TSH 1.207 02/21/2015  Previously: Lab Results  Component Value Date   TSH 1.453 08/02/2011   TSH 0.666 09/14/2009   TSH 0.295  05/30/2009   TSH 1.126 03/18/2009   TSH 0.104  09/15/2008   TSH 0.066  09/14/2008   TSH 1.041  02/03/2008   TSH 0.992 09/25/2007   TSH 0.257 08/25/2007   TSH 1.181 05/23/2006   She has a h/o thyroid nodules. + dysphagia, + hoarseness.  Reviewed thyroid U/S (10/2013):  Right thyroid lobe: 3.7 x 1.0 x 1.8 cm   Left thyroid lobe: 3.5 x 1.5 x 1.6 cm   Isthmus: 0.5 cm   Focal nodules: There is a 0.3 mm calcified  nodule in the right midzone. There is a 0.6 x 0.7 x 0.5 cm hypoechoic nodule in the  lateral aspect of the right midzone. There is a 0.7 x 0.6 x 0.5 cm hyperechoic nodule in the lateral aspect of the right lower pole.  There is a 2.2 x 1.1 x 1.7 cm complex solid nodule in the inferior aspect of the isthmus extending adjacent to the left lower pole.  There is a 1.4 x 2.1 x 1.4 cm complex solid nodule in the left lower pole.   Lymphadenopathy: None visualized.  Repeat U/S (01/2014): stable appearance of nodules  FNA (2014) x2: benign   ROS: Constitutional: + weight loss, no fatigue, + subjective hypothermia, + nocturia Eyes: no blurry vision, no xerophthalmia ENT: no sore throat, no nodules palpated in throat, no dysphagia/no odynophagia, no hoarseness Cardiovascular: no CP/SOB/palpitations/+ leg swelling Respiratory: no cough/SOB Gastrointestinal: + N/no  V/D/C Musculoskeletal: no muscle/joint aches Skin: no rashes Neurological: no tremors/numbness/tingling/dizziness  I reviewed pt's medications, allergies, PMH, social hx, family hx, and changes were documented in the history of present illness. Otherwise, unchanged from my initial visit note - except stopped Remeron, started Risperdal: Past Medical History  Diagnosis Date  . Bipolar disorder ([place])   . CVA (cerebral infarction)   . Pancreatitis 2006    due to Depakote with normal EUS   . Osteoporosis   . Chronic back pain   . Trigger finger   . Anxiety disorder   . Hypertension   . Migraines     chronic headaches  . Diverticulosis     TCS 9/08 by Dr. Delfin Edis for diarrhea . Bx for micro scopic colitis negative.   . Schatzki's ring     non critical / EGD with ED 8/2011with RMR  . S/P colonoscopy [id number], 2011    left-sided diverticula, hx of simple adenomas . 2011, random bx negative for microscopic colitis  . Glaucoma   . Allergic rhinitis   . Hypothyroidism     thyroid goiter  . Anemia   . Blood transfusion   . GERD (gastroesophageal reflux disease)   . Stroke [facility])     left sided weakness  . Seizures ([place])     unknown etiology-on meds-last seizure was 3 yerars ago  . Anxiety   . Depression   . Metabolic encephalopathy [id number]  . Sleep apnea     on CPAP  . Arthritis   . Gum symptoms     infection on antibiotic  . Chronic neck pain   . Mononeuritis lower limb   . Frequent falls   . Diabetes mellitus     Type II  . Complication of anesthesia     "hard to put sleep"  . Cervical disc disorder with radiculopathy of cervical region 10/31/2012  . COPD (chronic obstructive pulmonary disease) with chronic bronchitis ([place]) 09/16/2013    Office Spirometry 10/30/2013-submaximal effort based on appearance of loop and curve. Numbers would fit with severe restriction but her physiologic capability may be better than this. FVC 0.91/44%, and 10.74/45%, FEV1/FVC 0.81, FEF  25-75% 1.43/69%    . Carpal tunnel syndrome of right wrist 05/23/2011  . Hemiplegia affecting non-dominant side, post-stroke ([place]) 08/02/2011  . Pulmonary hypertension ([place]) 02/22/2015    45 mmHg   . LUE weakness 02/21/15    Left hand drop   Past Surgical History  Procedure Laterality Date  . Abdominal hysterectomy  1978  . Cholecystectomy  1984  . Ovarian cyst removal    . Carpal  tunnel release Left 07/22/04    Dr. Aline Brochure  . Breast reduction surgery  1994  . Cataract extraction Bilateral   . Biopsy thyroid  2009  . Surgical excision of 3 tumors from right thigh and right buttock  and left upper thigh  2010  . Back surgery  July 2012  . Spine surgery  09/29/2010    Dr. Rolena Infante  . Maloney dilation  12/29/2010    RMR;  . Esophagogastroduodenoscopy  12/29/2010    Rourk-Retained food in the esophagus and stomach, small hiatal hernia, status post Maloney dilation of the esophagus  . Craniotomy  11/23/2011    Procedure: CRANIOTOMY TUMOR EXCISION;  Surgeon: Hosie Spangle, MD;  Location: [facility];  Service: Neurosurgery;  Laterality: N/A;  Craniotomy for tumor resection  . Brain surgery  11/2011    resection of meningioma  . Colonoscopy N/A 09/25/2012    [id number] diverticulosis.  colonic polyp-removed : tubular adenoma  . Esophagogastroduodenoscopy N/A 09/25/2012    [id number] atonic baggy esophagus status post Maloney dilation 37 F. Hiatal hernia  . Givens capsule study N/A 01/15/2013    NORMAL.   . Bacterial overgrowth test N/A 05/05/2013    Procedure: BACTERIAL OVERGROWTH TEST;  Surgeon: Daneil Dolin, MD;  Location: [facility];  Service: Endoscopy;  Laterality: N/A;  7:30  . Transthoracic echocardiogram  2010    EF 60-65%, mild conc LVH, grade 1 diastolic dysfunction; mildly calcified MV annulus with mildly thickened leaflets, mildly calcified MR annulus  . Nm myocar perf wall motion  2006    "relavtiely normal" persantine, mild anterior thinning (breast attenuation  artifact), no region of scar/ischemia  . Cardiac catheterization  05/10/2005    normal coronaries, normal LV systolic function and EF (Dr. Jackie Plum)  . Tooth extraction Bilateral 12/14/2014    Procedure: REMOVAL OF BILATERAL MANDIBULAR EXOSTOSES;  Surgeon: Diona Browner, DDS;  Location: [facility];  Service: Oral Surgery;  Laterality: Bilateral;   History   Social History  . Marital Status: Divorced    Spouse Name: N/A    Number of Children: 1  . Years of Education: 12   Occupational History  . disabled     Social History Main Topics  . Smoking status: Current Every Day Smoker -- 0.25 packs/day for 7 years    Types: Cigarettes  . Smokeless tobacco: Never Used     Comment: "started back but off now for 3 months" (08/18/13)  . Alcohol Use: No     Comment:    . Drug Use: No   Current Outpatient Prescriptions on File Prior to Visit  Medication Sig Dispense Refill  . aspirin EC 81 MG tablet Take 81 mg by mouth daily.    . Cholecalciferol (D 5000) 5000 UNITS capsule Take 5,000 Units by mouth daily.    . cloNIDine (CATAPRES) 0.3 MG tablet TAKE 1 TABLET BY MOUTH EVERY EIGHT HOURS. ONCE AT 8AM, 4PM, AND 12 MIDNIGHT. 90 tablet 3  . diclofenac sodium (VOLTAREN) 1 % GEL Apply 2 g topically daily as needed (Pain).    [name] dicyclomine (BENTYL) 10 MG capsule Take 10 mg by mouth 3 (three) times daily before meals.     [name] glucose blood (ACCU-CHEK AVIVA PLUS) test strip Use to test blood sugar 4 times daily as instructed. Dx code: E10.65 400 each 3  . hydrochlorothiazide (HYDRODIURIL) 25 MG tablet Take 1 tablet (25 mg total) by mouth daily. 30 tablet 3  . HYDROmorphone (DILAUDID) 4 MG tablet Take 4  mg by mouth 3 (three) times daily as needed. For pain    . hydroxychloroquine (PLAQUENIL) 200 MG tablet Take 200 mg by mouth daily.    . insulin aspart (NOVOLOG FLEXPEN) 100 UNIT/ML FlexPen Inject 14-20 Units into the skin 3 (three) times daily with meals. (Patient taking differently: Inject 16 Units into the skin 3  (three) times daily with meals. ) 45 mL 1  . Insulin Glargine (LANTUS) 100 UNIT/ML Solostar Pen Inject 50 Units into the skin daily at 10 pm. 45 mL 1  . Insulin Pen Needle (UNIFINE PENTIPS) 31G X 6 MM MISC Use to inject insulin 4 times daily as directed. 390 each 3  .      . lamoTRIgine (LAMICTAL) 100 MG tablet Take 1.5 tablets (150 mg total) by mouth every morning. 45 tablet 5  . levothyroxine (LEVOXYL) 50 MCG tablet Take 50 mcg by mouth every morning.     [name] LORazepam (ATIVAN) 0.5 MG tablet Take 0.5 mg by mouth at bedtime.     [name] losartan (COZAAR) 100 MG tablet TAKE 1 TABLET BY MOUTH ONCE DAILY. 30 tablet 3  . methocarbamol (ROBAXIN) 500 MG tablet Take 500 mg by mouth every morning.     . metoprolol (LOPRESSOR) 50 MG tablet TAKE 1 TABLET BY MOUTH TWICE DAILY. 60 tablet 5  . mirtazapine (REMERON) 30 MG tablet Take 30 mg by mouth at bedtime.     . mometasone-formoterol (DULERA) 100-5 MCG/ACT AERO Inhale 2 puffs into the lungs 2 (two) times daily. 1 Inhaler 0  . montelukast (SINGULAIR) 10 MG tablet TAKE ONE TABLET BY MOUTH ONCE DAILY. 30 tablet 2  . Multiple Vitamins-Minerals (ONE-A-DAY 50 PLUS PO) Take 1 tablet by mouth daily.    . pregabalin (LYRICA) 75 MG capsule Take 75 mg by mouth 2 (two) times daily.     . RABEprazole (ACIPHEX) 20 MG tablet TAKE 1 TABLET BY MOUTH TWICE DAILY. (Patient not taking: Reported on 03/03/2014) 60 tablet 5  . RABEprazole (ACIPHEX) 20 MG tablet Take 20 mg by mouth every morning.    . RESTASIS 0.05 % ophthalmic emulsion Place 1 drop into both eyes 2 (two) times daily.     . rosuvastatin (CRESTOR) 5 MG tablet TAKE 1 TABLET BY MOUTH AT BEDTIME. (Patient taking differently: Take 5 mg by mouth at bedtime. TAKE 1 TABLET BY MOUTH AT BEDTIME.) 30 tablet 3  . sertraline (ZOLOFT) 100 MG tablet Take 100 mg by mouth every morning.     [name] spironolactone (ALDACTONE) 25 MG tablet TAKE ONE TABLET BY MOUTH DAILY. 30 tablet 3  . terbinafine (LAMISIL) 250 MG tablet Take 1 tablet by mouth  daily.    [name] thioridazine (MELLARIL) 10 MG tablet Take 20 mg by mouth at bedtime.     . traZODone (DESYREL) 50 MG tablet Take 50 mg by mouth at bedtime.     [name] ZETIA 10 MG tablet TAKE ONE TABLET BY MOUTH ONCE DAILY. 30 tablet 2   No current facility-administered medications on file prior to visit.   Allergies  Allergen Reactions  . Cephalexin Hives  . Iron Nausea And Vomiting  . Milk-Related Compounds Other (See Comments)    Doesn't agree with stomach.   . Penicillins Hives    Has patient had a PCN reaction causing immediate rash, facial/tongue/throat swelling, SOB or lightheadedness with hypotension: Yes Has patient had a PCN reaction causing severe rash involving mucus membranes or skin necrosis: No Has patient had a PCN reaction that required hospitalization No Has  patient had a PCN reaction occurring within the last 10 years: No If all of the above answers are "NO", then may proceed with Cephalosporin use.   [name] Phenazopyridine Hcl Hives         Family History  Problem Relation Age of Onset  . Heart attack Mother     HTN  . Pneumonia Father   . Kidney failure Father   . Diabetes Father   . Pancreatic cancer Sister   . Diabetes Brother   . Hypertension Brother   . Diabetes Brother   . Colon cancer Neg Hx   . Anesthesia problems Neg Hx   . Hypotension Neg Hx   . Malignant hyperthermia Neg Hx   . Pseudochol deficiency Neg Hx   . Stroke Maternal Grandmother   . Heart attack Maternal Grandfather   . Cancer Sister     breast   . Alcohol abuse Maternal Uncle   . Hypertension Brother    PE: BP 112/60 mmHg  Pulse 66  Temp(Src) 98.5 F (36.9 C) (Oral)  Resp 12  Wt 159 lb 12.8 oz (72.485 kg)  SpO2 96% Body mass index is 30.21 kg/(m^2).  Wt Readings from Last 3 Encounters:  03/25/15 159 lb 12.8 oz (72.485 kg)  03/19/15 155 lb (70.308 kg)  03/02/15 155 lb 12.8 oz (70.67 kg)   Constitutional: overweight, in NAD but again appears somnolent Eyes: surgical pupils, EOMI, no  exophthalmos ENT: moist mucous membranes, no thyromegaly, large thyroid nodule felt in isthmus, no cervical lymphadenopathy Cardiovascular: RRR, No MRG Respiratory: CTA B Gastrointestinal: abdomen soft, NT, ND, BS+ Musculoskeletal: no deformities, strength intact in all 4 Skin: moist, warm, no rashes Neurological: no tremor with outstretched hands, DTR normal in all 4  ASSESSMENT: 1. DM2, insulin-dependent, uncontrolled, without complications - gastroparesis - cerebrovasc. Ds -  H/o stroke - PN  2. Hypothyroidism  3. MNG - Thyroid U/S (10/11/2012):  Right thyroid lobe: 3.7 x 1.0 x 1.8 cm  Left thyroid lobe: 3.5 x 1.5 x 1.6 cm  Isthmus: 0.5 cm  Focal nodules: There is a 0.3 mm calcified nodule in the right midzone. There is a 0.6 x 0.7 x 0.5 cm hypoechoic nodule in the lateral aspect of the right midzone. There is a 0.7 x 0.6 x 0.5 cm hyperechoic nodule in the lateral aspect of the right lower pole. There is a 2.2 x 1.1 x 1.7 cm complex solid nodule in the inferior aspect of the isthmus extending adjacent to the left lower pole. There is a 1.4 x 2.1 x 1.4 cm complex solid nodule in the left lower pole.  Lymphadenopathy: None visualized.  IMPRESSION: Multi nodular goiter which has markedly progressed since the prior exam. The dominant nodule at the inferior aspect of the isthmus fits criteria for fine needle aspiration biopsy if not previously Assessed.  - FNA (11/05/2012) x2: benign  - Thyroid U/S (01/20/2014) - felt one nodule enlarging >> new U/S: stable appearance of the nodules, except a small new nodule in isthmus: 1.2 cm   Right thyroid lobe Measurements: 4.4 cm x 1.2 cm x 1.7 cm. Multiple right-sided nodules identified. Each right-sided nodule demonstrates increased echogenicity, with the superior measuring 7 mm -8 mm, most inferior measuring 9 mm - 10 mm. A small, 3 mm focus of calcium with posterior shadowing is evident. There is also a mid nodule  measuring 7 mm, which is echogenic.  Left thyroid lobe Measurements: 5.1 cm x 2.1 cm x 2.3 cm. Dominant lesion at the  inferior pole of left thyroid is again evident, which has been previously biopsied (10/29/2012). This nodule measures 1.8 cm x 1.7cm x 2.5 cm. (Previous 1.4 cm x 2.1 cm x 1.4 cm)  Isthmus Thickness: 4 mm-5 mm. Isthmic nodule again noted, previously biopsied (10/29/2012). Currently this nodule measures 2.2 cm x 1.4 cm x 1.8 cm. (previous measurement 2.2 cm x 1.1 cm x 1.7 cm). There is a new nodule identified within the isthmus with heterogeneously hyperechoic characteristics. This nodule measures 12 mm x 5.4 mm x 7.2 mm.  Lymphadenopathy: None visualized.   PLAN:  1. Patient with long-standing, uncontrolled diabetes, on basal-bolus regimen, with low sugars now, in the 30's! We will decrease all insulins: Patient Instructions  Please decrease: - Toujeo to 35 units at bedtime - Novolog:  14 units with a smaller meal  18 units with a larger meal  Please return in 1.5 months with your sugar log.   - continue checking sugars at different times of the day - check 3-4 times a day, rotating checks - advised for yearly eye exams >> she is UTD - HbA1c checked last month >> 6.4%  - Return to clinic in 1.5 mo with sugar log   2. Hypothyroidism - latest TFTs normal recently - we discussed how to take the thyroid hormone every day, with water, >30 minutes before breakfast, separated by >4 hours from acid reflux medications, calcium, iron, multivitamins. She is taking the LT4 correctly. - continue LT4 50 mcg daily and on Sunday, 25 mcg.  3. MNG - previously had 2 normal Bx's (2014). Reviewed the ultrasounds from 2014 in 2015. - will need a new U/S in 2 years from previous (01/2016).

## 2015-03-26 DIAGNOSIS — J440 Chronic obstructive pulmonary disease with acute lower respiratory infection: Secondary | ICD-10-CM | POA: Diagnosis not present

## 2015-03-26 DIAGNOSIS — E119 Type 2 diabetes mellitus without complications: Secondary | ICD-10-CM | POA: Diagnosis not present

## 2015-03-26 DIAGNOSIS — I10 Essential (primary) hypertension: Secondary | ICD-10-CM | POA: Diagnosis not present

## 2015-03-26 DIAGNOSIS — F17200 Nicotine dependence, unspecified, uncomplicated: Secondary | ICD-10-CM | POA: Diagnosis not present

## 2015-03-26 DIAGNOSIS — F250 Schizoaffective disorder, bipolar type: Secondary | ICD-10-CM | POA: Diagnosis not present

## 2015-03-26 DIAGNOSIS — I69354 Hemiplegia and hemiparesis following cerebral infarction affecting left non-dominant side: Secondary | ICD-10-CM | POA: Diagnosis not present

## 2015-03-26 DIAGNOSIS — M5010 Cervical disc disorder with radiculopathy, unspecified cervical region: Secondary | ICD-10-CM | POA: Diagnosis not present

## 2015-03-26 DIAGNOSIS — Z794 Long term (current) use of insulin: Secondary | ICD-10-CM | POA: Diagnosis not present

## 2015-03-26 DIAGNOSIS — R260 Ataxic gait: Secondary | ICD-10-CM | POA: Diagnosis not present

## 2015-03-30 ENCOUNTER — Encounter: Payer: Self-pay | Admitting: Obstetrics and Gynecology

## 2015-03-30 ENCOUNTER — Ambulatory Visit (INDEPENDENT_AMBULATORY_CARE_PROVIDER_SITE_OTHER): Payer: Commercial Managed Care - HMO | Admitting: Obstetrics and Gynecology

## 2015-03-30 VITALS — BP 120/82 | Ht 61.0 in

## 2015-03-30 DIAGNOSIS — N816 Rectocele: Secondary | ICD-10-CM

## 2015-03-30 DIAGNOSIS — Z90711 Acquired absence of uterus with remaining cervical stump: Secondary | ICD-10-CM

## 2015-03-30 NOTE — Progress Notes (Signed)
Patient ID: TIEARRA COLWELL, female   DOB: 1950/04/20, 65 y.o.   MRN: [id number]   [facility] Visit  Patient name: [name] MRN [id number]  Date of birth: [birth year]  CC & HPI:  [name] is a 65 y.o. female presenting today for referral for defecation difficulties, with pt having to strain but not hard stool present.  ROS:  S/p hyst. Had mri, shows signs of generalized pelvic relaxation. l  Pertinent History Reviewed:   Reviewed: Significant for s/p CVA with weakness in LUE with tremor Medical         Past Medical History  Diagnosis Date  . Bipolar disorder ([place])   . CVA (cerebral infarction)   . Pancreatitis 2006    due to Depakote with normal EUS   . Osteoporosis   . Chronic back pain   . Trigger finger   . Anxiety disorder   . Hypertension   . Migraines     chronic headaches  . Diverticulosis     TCS 9/08 by Dr. Delfin Edis for diarrhea . Bx for micro scopic colitis negative.   . Schatzki's ring     non critical / EGD with ED 8/2011with RMR  . S/P colonoscopy [id number], 2011    left-sided diverticula, hx of simple adenomas . 2011, random bx negative for microscopic colitis  . Glaucoma   . Allergic rhinitis   . Hypothyroidism     thyroid goiter  . Anemia   . Blood transfusion   . GERD (gastroesophageal reflux disease)   . Stroke [facility])     left sided weakness  . Seizures ([place])     unknown etiology-on meds-last seizure was 3 yerars ago  . Anxiety   . Depression   . Metabolic encephalopathy 4/70/9628  . Sleep apnea     on CPAP  . Arthritis   . Gum symptoms     infection on antibiotic  . Chronic neck pain   . Mononeuritis lower limb   . Frequent falls   . Diabetes mellitus     Type II  . Complication of anesthesia     "hard to put sleep"  . Cervical disc disorder with radiculopathy of cervical region 10/31/2012  . COPD (chronic obstructive pulmonary disease) with chronic bronchitis ([place]) 09/16/2013    Office Spirometry 10/30/2013-submaximal  effort based on appearance of loop and curve. Numbers would fit with severe restriction but her physiologic capability may be better than this. FVC 0.91/44%, and 10.74/45%, FEV1/FVC 0.81, FEF 25-75% 1.43/69%    . Carpal tunnel syndrome of right wrist 05/23/2011  . Hemiplegia affecting non-dominant side, post-stroke ([place]) 08/02/2011  . Pulmonary hypertension ([place]) 02/22/2015    45 mmHg   . LUE weakness 02/21/15    Left hand drop                              Surgical Hx:    Past Surgical History  Procedure Laterality Date  . Abdominal hysterectomy  1978  . Cholecystectomy  1984  . Ovarian cyst removal    . Carpal tunnel release Left 07/22/04    Dr. Aline Brochure  . Breast reduction surgery  1994  . Cataract extraction Bilateral   . Biopsy thyroid  2009  . Surgical excision of 3 tumors from right thigh and right buttock  and left upper thigh  2010  . Back surgery  July 2012  . Spine surgery  09/29/2010  Dr. Rolena Infante  . Maloney dilation  12/29/2010    RMR;  . Esophagogastroduodenoscopy  12/29/2010    [place]k-Retained food in the esophagus and stomach, small hiatal hernia, status post Maloney dilation of the esophagus  . Craniotomy  11/23/2011    Procedure: CRANIOTOMY TUMOR EXCISION;  Surgeon: Hosie Spangle, MD;  Location: [place] NEURO ORS;  Service: Neurosurgery;  Laterality: N/A;  Craniotomy for tumor resection  . Brain surgery  11/2011    resection of meningioma  . Colonoscopy N/A 09/25/2012    FKC:LEXNTZG diverticulosis.  colonic polyp-removed : tubular adenoma  . Esophagogastroduodenoscopy N/A 09/25/2012    YFV:CBSWHQPR atonic baggy esophagus status post Maloney dilation 87 F. Hiatal hernia  . Givens capsule study N/A 01/15/2013    NORMAL.   . Bacterial overgrowth test N/A 05/05/2013    Procedure: BACTERIAL OVERGROWTH TEST;  Surgeon: Daneil Dolin, MD;  [facility];  Service: Endoscopy;  Laterality: N/A;  7:30  . Transthoracic echocardiogram  2010    EF 60-65%, mild conc LVH,  grade 1 diastolic dysfunction; mildly calcified MV annulus with mildly thickened leaflets, mildly calcified MR annulus  . Nm myocar perf wall motion  2006    "relavtiely normal" persantine, mild anterior thinning (breast attenuation artifact), no region of scar/ischemia  . Cardiac catheterization  05/10/2005    normal coronaries, normal LV systolic function and EF (Dr. Jackie Plum)  . Tooth extraction Bilateral 12/14/2014    Procedure: REMOVAL OF BILATERAL MANDIBULAR EXOSTOSES;  Surgeon: Diona Browner, DDS;  Location: [place];  Service: Oral Surgery;  Laterality: Bilateral;   Medications: Reviewed & Updated - see associated section                       Current outpatient prescriptions:  .  ACCU-CHEK FASTCLIX LANCETS MISC, Use to test blood sugar 4 times daily. Dx: E10.65, Disp: 408 each, Rfl: 1 .  albuterol (PROVENTIL HFA;VENTOLIN HFA) 108 (90 BASE) MCG/ACT inhaler, Inhale 1 puff into the lungs 3 (three) times daily., Disp: 1 Inhaler, Rfl: 2 .  Alcohol Swabs (B-D SINGLE USE SWABS REGULAR) PADS, Use for injections and glucose testing 8 times daily. Dx: E10.65, Disp: 900 each, Rfl: 1 .  amLODipine (NORVASC) 5 MG tablet, Take 1 tablet (5 mg total) by mouth daily., Disp: 30 tablet, Rfl: 3 .  aspirin EC 81 MG tablet, Take 81 mg by mouth daily., Disp: , Rfl:  .  Blood Glucose Calibration (ACCU-CHEK SMARTVIEW CONTROL) LIQD, 1 each by Other route as needed., Disp: 1 each, Rfl: 2 .  Blood Glucose Monitoring Suppl (ACCU-CHEK NANO SMARTVIEW) W/DEVICE KIT, 1 each by Does not apply route daily. Dx: E10.65, Disp: 1 kit, Rfl: 0 .  Cholecalciferol (D 5000) 5000 UNITS capsule, Take 5,000 Units by mouth daily., Disp: , Rfl:  .  clobetasol cream (TEMOVATE) 9.16 %, Apply 1 application topically daily., Disp: , Rfl:  .  cloNIDine (CATAPRES) 0.3 MG tablet, TAKE 1 TABLET BY MOUTH EVERY EIGHT HOURS. ONCE AT 8AM, 4PM, AND 12 MIDNIGHT., Disp: 90 tablet, Rfl: 3 .  diclofenac sodium (VOLTAREN) 1 % GEL, Apply 2 g topically daily  as needed (Pain)., Disp: 100 g, Rfl: 0 .  dicyclomine (BENTYL) 10 MG capsule, TAKE 1 CAPSULE BY M OUTH THREE TIMES DAILY BEFORE MEALS., Disp: 90 capsule, Rfl: 3 .  docusate sodium (COLACE) 100 MG capsule, Take 2 capsules (200 mg total) by mouth at bedtime., Disp: 30 capsule, Rfl: 0 .  gentamicin (GARAMYCIN) 0.3 %  ophthalmic solution, Place 1 drop into the left eye 3 (three) times daily., Disp: 5 mL, Rfl: 0 .  glucose blood (ACCU-CHEK SMARTVIEW) test strip, Use to test blood sugar 4 times daily as instructed. Dx: E10.65, Disp: 375 each, Rfl: 1 .  hydroxychloroquine (PLAQUENIL) 200 MG tablet, Take 200 mg by mouth daily., Disp: , Rfl:  .  insulin aspart (NOVOLOG FLEXPEN) 100 UNIT/ML FlexPen, Inject 14-18 Units into the skin 3 (three) times daily with meals., Disp: 30 mL, Rfl: 1 .  Insulin Glargine (TOUJEO SOLOSTAR) 300 UNIT/ML SOPN, Inject 35 Units into the skin at bedtime., Disp: 6 pen, Rfl: 1 .  Insulin Pen Needle (CLICKFINE PEN NEEDLES) 32G X 4 MM MISC, Use 4x a day, Disp: 300 each, Rfl: 3 .  lamoTRIgine (LAMICTAL) 100 MG tablet, TAKE 1 TABLET BY MOUTH TWICE DAILY., Disp: 60 tablet, Rfl: 5 .  levothyroxine (SYNTHROID, LEVOTHROID) 50 MCG tablet, TAKE 1 TABLET BY MOUTH ONCE DAILY AND 1/2 TABLET ON SUNDAYS. (Patient taking differently: Take 25-50 mcg by mouth daily. TAKE 1 TABLET BY MOUTH ONCE DAILY AND 1/2 TABLET ON SUNDAYS.), Disp: 30 tablet, Rfl: 2 .  LORazepam (ATIVAN) 0.5 MG tablet, Take 1 tablet (0.5 mg total) by mouth 3 (three) times daily., Disp: 90 tablet, Rfl: 2 .  lubiprostone (AMITIZA) 8 MCG capsule, Take 1 capsule (8 mcg total) by mouth daily after breakfast., Disp: 20 capsule, Rfl: 0 .  methocarbamol (ROBAXIN) 500 MG tablet, Take 500 mg by mouth daily., Disp: , Rfl:  .  metoprolol (LOPRESSOR) 50 MG tablet, TAKE 1 TABLET BY MOUTH TWICE DAILY., Disp: 60 tablet, Rfl: 6 .  montelukast (SINGULAIR) 10 MG tablet, Take 1 tablet (10 mg total) by mouth daily., Disp: 30 tablet, Rfl: 2 .  Multiple  Vitamins-Minerals (ONE-A-DAY 50 PLUS PO), Take 1 tablet by mouth daily., Disp: , Rfl:  .  Nystatin (NYAMYC) 100000 UNIT/GM POWD, APPLY TO AFFECTED AREA 4 TIMES DAILY., Disp: 30 g, Rfl: 2 .  oxybutynin (DITROPAN) 5 MG tablet, One tablet once daily, Disp: 30 tablet, Rfl: 3 .  pregabalin (LYRICA) 75 MG capsule, Take 75 mg by mouth 2 (two) times daily. , Disp: , Rfl:  .  promethazine (PHENERGAN) 12.5 MG tablet, Take 1 tablet (12.5 mg total) by mouth every 6 (six) hours as needed for nausea or vomiting., Disp: 30 tablet, Rfl: 0 .  promethazine-dextromethorphan (PROMETHAZINE-DM) 6.25-15 MG/5ML syrup, One teaspoon at bedtime as needed , for excess cough, Disp: 180 mL, Rfl: 0 .  RABEprazole (ACIPHEX) 20 MG tablet, TAKE 1 TABLET BY MOUTH TWICE DAILY., Disp: 60 tablet, Rfl: 3 .  RESTASIS 0.05 % ophthalmic emulsion, Place 1 drop into both eyes 2 (two) times daily. , Disp: , Rfl:  .  risperiDONE (RISPERDAL) 0.5 MG tablet, Take 1 tablet (0.5 mg total) by mouth at bedtime., Disp: 30 tablet, Rfl: 2 .  rosuvastatin (CRESTOR) 5 MG tablet, TAKE 1 TABLET BY MOUTH AT BEDTIME., Disp: 30 tablet, Rfl: 4 .  sertraline (ZOLOFT) 100 MG tablet, Take 2 tablets (200 mg total) by mouth daily., Disp: 60 tablet, Rfl: 2 .  traZODone (DESYREL) 150 MG tablet, Take 2 tablets (300 mg total) by mouth at bedtime., Disp: 60 tablet, Rfl: 2 .  vitamin E 400 UNIT capsule, Take 400 Units by mouth daily., Disp: , Rfl:  .  ZETIA 10 MG tablet, TAKE ONE TABLET BY MOUTH ONCE DAILY., Disp: 30 tablet, Rfl: 4   Social History: Reviewed -  reports that she has been smoking Cigarettes.  She has a 3.5 pack-year smoking history. She has never used smokeless tobacco.  Objective Findings:  Vitals: Blood pressure 120/82, height 5' 1"  (1.549 m).  Physical Examination: General appearance - alert, well appearing, and in no distress, oriented to person, place, and time, normal appearing weight and chronically ill appearing Mental status - alert, oriented  to person, place, and time, normal mood, behavior, speech, dress, motor activity, and thought processes Left arm c tremor and weakness. Eyes - pupils equal and reactive, extraocular eye movements intact, left eye normal Chest - clear to auscultation, no wheezes, rales or rhonchi, symmetric air entry Heart - normal rate, regular rhythm, normal S1, S2, no murmurs, rubs, clicks or gallops Abdomen - soft, nontender, nondistended, no masses or organomegaly Pelvic - VULVA: normal appearing vulva with no masses, tenderness or lesions, VAGINA: normal appearing vagina with normal color and discharge, no lesions, atrophic, PELVIC FLOOR EXAM: rectocele , to 90 degree,,mild cystocele with cuff adequatel supported,  , CERVIX: surgically absent, normal appearing cervix without discharge orly lesions, UTERUS: uterus is normal size, shape, consistency and nontender, ADNEXA: normal adnexa in size, nontender and no masses, RECTAL: normal rectal, no masses   Assessment & Plan:   A:  1. Pelvic relaxation s/p hysterectomy 2 rectocele  P:  1. Discuss posterior repair done in detail preOp in 2 wk Surgery shortly thereafter

## 2015-03-30 NOTE — Progress Notes (Signed)
Patient ID: [name], female   DOB: June 26, 1950, 65 y.o.   MRN: [id number] Pt here today for follow up from an MRI done by Dr. Gala Romney.

## 2015-03-30 NOTE — Patient Instructions (Signed)
Anterior and Posterior Colporrhaphy, Sling Procedure, Care After Refer to this sheet in the next few weeks. These instructions provide you with information on caring for yourself after your procedure. Your health care provider may also give you more specific instructions. Your treatment has been planned according to current medical practices, but problems sometimes occur. Call your health care provider if you have any problems or questions after your procedure.  HOME CARE INSTRUCTIONS  Rest as much as possible during the first 2 weeks after the procedure.   Avoid heavy lifting (more than 10 pounds [4.5 kg]), pushing, or pulling. Limit stair climbing to once or twice a day the first week, then slowly increase this activity.   Avoid standing for prolonged periods of time.   Talk with your health care provider about when you may resume your usual physical activity.   You may resume your normal diet right away.   Drink at least 6-8 glasses of non-caffeinated beverages per day.   Eat a well-balanced diet. Daily portions of food from the meat (protein), milk, fruit, vegetable, and bread families are necessary for your health.   Your normal bowel function should return. If you become constipated, you may:   Take a mild laxative.  Add fruit and bran to your diet.  Drink more liquids.  You may take a shower and wash your hair.   Only take over-the-counter or prescription medicines as directed by your health care provider.   Clean the incision with water. Do not use a dressing unless the incision is draining or irritated. Check your incision daily for redness, draining, swelling, or separation of the skin.   Follow any bladder care instructions provided by your health care provider.   Keep your perineal area (the area between vagina and rectum) clean and dry. Perform perineal care after every bowel movement and each time you urinate. You may take a sitz bath or sit in a tub of  clean, warm water when necessary, unless your health care provider tells you otherwise.   Do not have sexual intercourse until permitted by your health care provider.   Follow up with your health care provider as directed.  SEEK MEDICAL CARE IF:  You have shaking chills.   Your pain is not relieved with medicine or becomes worse.  You have frequent or urgent urination, or you are unable to completely empty your bladder.   You feel a burning sensation when urinating.   You see pus coming from the wounds.  SEEK IMMEDIATE MEDICAL CARE IF:  You develop a fever.  You notice redness, drainage, swelling, or separation of the skin at the incision site.  You have difficulty breathing.  You are unable to urinate. MAKE SURE YOU:   Understand these instructions.  Will watch your condition.  Will get help right away if you are not doing well or get worse.   This information is not intended to replace advice given to you by your health care provider. Make sure you discuss any questions you have with your health care provider.   Document Released: 10/12/2003 Document Revised: 10/30/2012 Document Reviewed: 07/19/2012 Elsevier Interactive Patient Education 2016 [employer].     About Rectocele  Overview  A rectocele is a type of hernia which causes different degrees of bulging of the rectal tissues into the vaginal wall.  You may even notice that it presses against the vaginal wall so much that some vaginal tissues droop outside of the opening of your vagina.  Causes  of Rectocele  The most common cause is childbirth.  The muscles and ligaments in the pelvis that hold up and support the female organs and vagina become stretched and weakened during labor and delivery.  The more babies you have, the more the support tissues are stretched and weakened.  Not everyone who has a baby will develop a rectocele.  Some women have stronger supporting tissue in the pelvis and may not  have as much of a problem as others.  Women who have a Cesarean section usually do not get rectocele's unless they pushed a long time prior to the cesarean delivery.  Other conditions that can cause a rectocele include chronic constipation, a chronic cough, a lot of heavy lifting, and obesity.  Older women may have this problem because the loss of female hormones causes the vaginal tissue to become weaker.  Symptoms  There may not be any symptoms.  If you do have symptoms, they may include:  Pelvic pressure in the rectal area  Protrusion of the lower part of the vagina through the opening of the vagina  Constipation and trapping of the stool, making it difficult to have a bowel movement.  In severe cases, you may have to press on the lower part of your vagina to help push the stool out of you rectum.  This is called splinting to empty.  Diagnosing Rectocele  Your health care provider will ask about your symptoms and perform a pelvic exam.  S/he will ask you to bear down, pushing like you are having a bowel movement so as to see how far the lower part of the vagina protrudes into the vagina and possible outside of the vagina.  Your provider will also ask you to contract the muscles of your pelvis (like you are stopping the stream in the middle of urinating) to determine the strength of your pelvic muscles.  Your provider may also do a rectal exam.  Treatment Options  If you do not have any symptoms, no treatment may be necessary.  Other treatment options include:  Pelvic floor exercises: Contracting the muscles in your genital area may help strengthen your muscles and support the organs.  Be sure to get proper exercise instruction from you physical therapist.  A pessary (removealbe pelvic support device) sometimes helps rectocele symptoms.  Surgery: Surgical repair may be necessary. In some cases the uterus may need to be taken out ( a hysterectomy) as well.  There are many types of surgery  for pelvic support problems.  Look for physicians who specialize in repair procedures.  You can take care of yourself by:  Treating and preventing constipation  Avoiding heavy lifting, and lifting correctly (with your legs, not with you waist or back)  Treating a chronic cough or bronchitis  Not smoking  avoiding too much weight gain  Doing pelvic floor exercises   2007, [employer] Doc.33

## 2015-03-31 ENCOUNTER — Telehealth: Payer: Self-pay | Admitting: Internal Medicine

## 2015-03-31 ENCOUNTER — Other Ambulatory Visit: Payer: Self-pay | Admitting: *Deleted

## 2015-03-31 DIAGNOSIS — F17200 Nicotine dependence, unspecified, uncomplicated: Secondary | ICD-10-CM | POA: Diagnosis not present

## 2015-03-31 DIAGNOSIS — E119 Type 2 diabetes mellitus without complications: Secondary | ICD-10-CM | POA: Diagnosis not present

## 2015-03-31 DIAGNOSIS — F250 Schizoaffective disorder, bipolar type: Secondary | ICD-10-CM | POA: Diagnosis not present

## 2015-03-31 DIAGNOSIS — I10 Essential (primary) hypertension: Secondary | ICD-10-CM | POA: Diagnosis not present

## 2015-03-31 DIAGNOSIS — R260 Ataxic gait: Secondary | ICD-10-CM | POA: Diagnosis not present

## 2015-03-31 DIAGNOSIS — M5010 Cervical disc disorder with radiculopathy, unspecified cervical region: Secondary | ICD-10-CM | POA: Diagnosis not present

## 2015-03-31 DIAGNOSIS — Z794 Long term (current) use of insulin: Secondary | ICD-10-CM | POA: Diagnosis not present

## 2015-03-31 DIAGNOSIS — J440 Chronic obstructive pulmonary disease with acute lower respiratory infection: Secondary | ICD-10-CM | POA: Diagnosis not present

## 2015-03-31 DIAGNOSIS — I69354 Hemiplegia and hemiparesis following cerebral infarction affecting left non-dominant side: Secondary | ICD-10-CM | POA: Diagnosis not present

## 2015-03-31 NOTE — Patient Outreach (Signed)
VM from [place] at [employer] requesting call. Call back to [facility], she is asking about need for patient to have PFT prior to surgery. Discussed that this RNCM not aware of any upcoming procedures and had not discussed any needs, but patient does have HHRN, Nikki and can contact her regarding the above. And will call back. Royetta Crochet. Laymond Purser, RN, BSN, [place] [phone]

## 2015-03-31 NOTE — Telephone Encounter (Signed)
Spoke with Stanton Kidney, nurse at [facility] that she did not relay this info to pt but Stanton Kidney also does not see pt regularly-has a nurse come out weekly to do pill boxes for her.  Stanton Kidney states that she will reach out to nurse that sees patient to see if this was something she mentioned to pt.  Will call our office back.

## 2015-03-31 NOTE — Telephone Encounter (Signed)
Mary returned call [phone] or [phone]

## 2015-03-31 NOTE — Patient Outreach (Signed)
Call to [facility], nurse with [employer]. She is not aware of patient needs for PFT before surgery. She has appointment to see patient tomorrow and will discuss with her and get back to [facility]. Plan to let Caryl Pina at pulmonary know of the results of [facility] visit with patient. Royetta Crochet. Laymond Purser, RN, BSN, [place] [phone]

## 2015-03-31 NOTE — Telephone Encounter (Signed)
Spoke with pt, states that pt's in-home nurse told her that she needed a "breathing study" before her surgery.  States that she was not told this by her insurance company.  Pt could not tell me what company her home health nurse is through. Per Epic, she was referred to [facility] in-home nursing in 01/2015.  Called [facility], states that her home health nurse is named "mary"-phone # [phone]. Mal Amabile, nurse through [facility], lmtcb on named voicemail.

## 2015-03-31 NOTE — Telephone Encounter (Signed)
Spoke with Stanton Kidney again at [facility] that no nurses have spoken to pt about needing a pft, but Nicki (nurse who checks on pt weekly) will check up with pt tomorrow to see if she did in fact receive any letter from [facility], and will call back tomorrow with this info.  Will await call back.

## 2015-03-31 NOTE — Patient Outreach (Signed)
Call back to [facility] pulmonology, discussed [facility] findings and will call her back tomorrow after Lexine Baton, [facility] with [employer] sees patient and if patient has any orders or papers regarding procedure and any needed tests. Caryl Pina agrees to [employer]. Laymond Purser, RN, BSN, [place] [phone]

## 2015-04-01 ENCOUNTER — Other Ambulatory Visit: Payer: Self-pay | Admitting: *Deleted

## 2015-04-01 DIAGNOSIS — J440 Chronic obstructive pulmonary disease with acute lower respiratory infection: Secondary | ICD-10-CM | POA: Diagnosis not present

## 2015-04-01 DIAGNOSIS — E119 Type 2 diabetes mellitus without complications: Secondary | ICD-10-CM | POA: Diagnosis not present

## 2015-04-01 DIAGNOSIS — Z794 Long term (current) use of insulin: Secondary | ICD-10-CM | POA: Diagnosis not present

## 2015-04-01 DIAGNOSIS — F250 Schizoaffective disorder, bipolar type: Secondary | ICD-10-CM | POA: Diagnosis not present

## 2015-04-01 DIAGNOSIS — R260 Ataxic gait: Secondary | ICD-10-CM | POA: Diagnosis not present

## 2015-04-01 DIAGNOSIS — I10 Essential (primary) hypertension: Secondary | ICD-10-CM | POA: Diagnosis not present

## 2015-04-01 DIAGNOSIS — F17200 Nicotine dependence, unspecified, uncomplicated: Secondary | ICD-10-CM | POA: Diagnosis not present

## 2015-04-01 DIAGNOSIS — M5010 Cervical disc disorder with radiculopathy, unspecified cervical region: Secondary | ICD-10-CM | POA: Diagnosis not present

## 2015-04-01 DIAGNOSIS — I69354 Hemiplegia and hemiparesis following cerebral infarction affecting left non-dominant side: Secondary | ICD-10-CM | POA: Diagnosis not present

## 2015-04-01 NOTE — Patient Outreach (Signed)
Call to [facility] to inform of patient pre-op appointment 04/12/15 and any needed tests can be ordered then if needed. Plan to follow up with patient as scheduled in February. [name]. Laymond Purser, RN, BSN, [place] [phone]

## 2015-04-01 NOTE — Patient Outreach (Signed)
Call from [place], [place] at [facility], she followed up with patient regarding surgery and needed test. She states patient was confused with something her "[employer] Nurse" told her about wheezing in the past, but has not been told she needs any test by the surgeon as of yet.  She has pre-op appointment on 1/30 and any needed tests will be ordered then.  Plan to call Caryl Pina and relate the above. Royetta Crochet. Laymond Purser, RN, BSN, [place] [phone]

## 2015-04-01 NOTE — Telephone Encounter (Signed)
Spoke with [facility], states that the patient does not need any testing at this time. Her Pre-Op appt is scheduled for 04/12/15 and if anything is stated to be needed at that time, they will call and let us know. Nothing further needed.

## 2015-04-02 DIAGNOSIS — I69354 Hemiplegia and hemiparesis following cerebral infarction affecting left non-dominant side: Secondary | ICD-10-CM | POA: Diagnosis not present

## 2015-04-02 DIAGNOSIS — R260 Ataxic gait: Secondary | ICD-10-CM | POA: Diagnosis not present

## 2015-04-02 DIAGNOSIS — Z794 Long term (current) use of insulin: Secondary | ICD-10-CM | POA: Diagnosis not present

## 2015-04-02 DIAGNOSIS — I10 Essential (primary) hypertension: Secondary | ICD-10-CM | POA: Diagnosis not present

## 2015-04-02 DIAGNOSIS — M5010 Cervical disc disorder with radiculopathy, unspecified cervical region: Secondary | ICD-10-CM | POA: Diagnosis not present

## 2015-04-02 DIAGNOSIS — F17200 Nicotine dependence, unspecified, uncomplicated: Secondary | ICD-10-CM | POA: Diagnosis not present

## 2015-04-02 DIAGNOSIS — F250 Schizoaffective disorder, bipolar type: Secondary | ICD-10-CM | POA: Diagnosis not present

## 2015-04-02 DIAGNOSIS — J440 Chronic obstructive pulmonary disease with acute lower respiratory infection: Secondary | ICD-10-CM | POA: Diagnosis not present

## 2015-04-02 DIAGNOSIS — E119 Type 2 diabetes mellitus without complications: Secondary | ICD-10-CM | POA: Diagnosis not present

## 2015-04-05 DIAGNOSIS — E119 Type 2 diabetes mellitus without complications: Secondary | ICD-10-CM | POA: Diagnosis not present

## 2015-04-05 DIAGNOSIS — J440 Chronic obstructive pulmonary disease with acute lower respiratory infection: Secondary | ICD-10-CM | POA: Diagnosis not present

## 2015-04-05 DIAGNOSIS — I10 Essential (primary) hypertension: Secondary | ICD-10-CM | POA: Diagnosis not present

## 2015-04-05 DIAGNOSIS — F17200 Nicotine dependence, unspecified, uncomplicated: Secondary | ICD-10-CM | POA: Diagnosis not present

## 2015-04-05 DIAGNOSIS — Z794 Long term (current) use of insulin: Secondary | ICD-10-CM | POA: Diagnosis not present

## 2015-04-05 DIAGNOSIS — M5010 Cervical disc disorder with radiculopathy, unspecified cervical region: Secondary | ICD-10-CM | POA: Diagnosis not present

## 2015-04-05 DIAGNOSIS — I69354 Hemiplegia and hemiparesis following cerebral infarction affecting left non-dominant side: Secondary | ICD-10-CM | POA: Diagnosis not present

## 2015-04-05 DIAGNOSIS — R260 Ataxic gait: Secondary | ICD-10-CM | POA: Diagnosis not present

## 2015-04-05 DIAGNOSIS — F250 Schizoaffective disorder, bipolar type: Secondary | ICD-10-CM | POA: Diagnosis not present

## 2015-04-06 DIAGNOSIS — I69354 Hemiplegia and hemiparesis following cerebral infarction affecting left non-dominant side: Secondary | ICD-10-CM | POA: Diagnosis not present

## 2015-04-06 DIAGNOSIS — E119 Type 2 diabetes mellitus without complications: Secondary | ICD-10-CM | POA: Diagnosis not present

## 2015-04-06 DIAGNOSIS — R260 Ataxic gait: Secondary | ICD-10-CM | POA: Diagnosis not present

## 2015-04-06 DIAGNOSIS — Z794 Long term (current) use of insulin: Secondary | ICD-10-CM | POA: Diagnosis not present

## 2015-04-06 DIAGNOSIS — J440 Chronic obstructive pulmonary disease with acute lower respiratory infection: Secondary | ICD-10-CM | POA: Diagnosis not present

## 2015-04-06 DIAGNOSIS — F17200 Nicotine dependence, unspecified, uncomplicated: Secondary | ICD-10-CM | POA: Diagnosis not present

## 2015-04-06 DIAGNOSIS — I10 Essential (primary) hypertension: Secondary | ICD-10-CM | POA: Diagnosis not present

## 2015-04-06 DIAGNOSIS — M5010 Cervical disc disorder with radiculopathy, unspecified cervical region: Secondary | ICD-10-CM | POA: Diagnosis not present

## 2015-04-06 DIAGNOSIS — F250 Schizoaffective disorder, bipolar type: Secondary | ICD-10-CM | POA: Diagnosis not present

## 2015-04-07 DIAGNOSIS — F250 Schizoaffective disorder, bipolar type: Secondary | ICD-10-CM | POA: Diagnosis not present

## 2015-04-07 DIAGNOSIS — Z794 Long term (current) use of insulin: Secondary | ICD-10-CM | POA: Diagnosis not present

## 2015-04-07 DIAGNOSIS — R260 Ataxic gait: Secondary | ICD-10-CM | POA: Diagnosis not present

## 2015-04-07 DIAGNOSIS — M5010 Cervical disc disorder with radiculopathy, unspecified cervical region: Secondary | ICD-10-CM | POA: Diagnosis not present

## 2015-04-07 DIAGNOSIS — F17200 Nicotine dependence, unspecified, uncomplicated: Secondary | ICD-10-CM | POA: Diagnosis not present

## 2015-04-07 DIAGNOSIS — I69354 Hemiplegia and hemiparesis following cerebral infarction affecting left non-dominant side: Secondary | ICD-10-CM | POA: Diagnosis not present

## 2015-04-07 DIAGNOSIS — J440 Chronic obstructive pulmonary disease with acute lower respiratory infection: Secondary | ICD-10-CM | POA: Diagnosis not present

## 2015-04-07 DIAGNOSIS — I10 Essential (primary) hypertension: Secondary | ICD-10-CM | POA: Diagnosis not present

## 2015-04-07 DIAGNOSIS — E119 Type 2 diabetes mellitus without complications: Secondary | ICD-10-CM | POA: Diagnosis not present

## 2015-04-08 DIAGNOSIS — J440 Chronic obstructive pulmonary disease with acute lower respiratory infection: Secondary | ICD-10-CM | POA: Diagnosis not present

## 2015-04-08 DIAGNOSIS — D492 Neoplasm of unspecified behavior of bone, soft tissue, and skin: Secondary | ICD-10-CM | POA: Diagnosis not present

## 2015-04-08 DIAGNOSIS — M79609 Pain in unspecified limb: Secondary | ICD-10-CM | POA: Diagnosis not present

## 2015-04-08 DIAGNOSIS — F250 Schizoaffective disorder, bipolar type: Secondary | ICD-10-CM | POA: Diagnosis not present

## 2015-04-08 DIAGNOSIS — M5010 Cervical disc disorder with radiculopathy, unspecified cervical region: Secondary | ICD-10-CM | POA: Diagnosis not present

## 2015-04-08 DIAGNOSIS — E1151 Type 2 diabetes mellitus with diabetic peripheral angiopathy without gangrene: Secondary | ICD-10-CM | POA: Diagnosis not present

## 2015-04-08 DIAGNOSIS — Z794 Long term (current) use of insulin: Secondary | ICD-10-CM | POA: Diagnosis not present

## 2015-04-08 DIAGNOSIS — F17200 Nicotine dependence, unspecified, uncomplicated: Secondary | ICD-10-CM | POA: Diagnosis not present

## 2015-04-08 DIAGNOSIS — I10 Essential (primary) hypertension: Secondary | ICD-10-CM | POA: Diagnosis not present

## 2015-04-08 DIAGNOSIS — E119 Type 2 diabetes mellitus without complications: Secondary | ICD-10-CM | POA: Diagnosis not present

## 2015-04-08 DIAGNOSIS — L603 Nail dystrophy: Secondary | ICD-10-CM | POA: Diagnosis not present

## 2015-04-08 DIAGNOSIS — I739 Peripheral vascular disease, unspecified: Secondary | ICD-10-CM | POA: Diagnosis not present

## 2015-04-08 DIAGNOSIS — I69354 Hemiplegia and hemiparesis following cerebral infarction affecting left non-dominant side: Secondary | ICD-10-CM | POA: Diagnosis not present

## 2015-04-08 DIAGNOSIS — R260 Ataxic gait: Secondary | ICD-10-CM | POA: Diagnosis not present

## 2015-04-08 DIAGNOSIS — B079 Viral wart, unspecified: Secondary | ICD-10-CM | POA: Diagnosis not present

## 2015-04-10 IMAGING — CR DG CHEST 2V
2 series · 2 of 2 positions shown · non-contrast
Comparison: Portable chest of June 06, 2013

CLINICAL DATA: Cough and congestion

EXAM:
CHEST  2 VIEW

[view not recorded (1 of 2)]
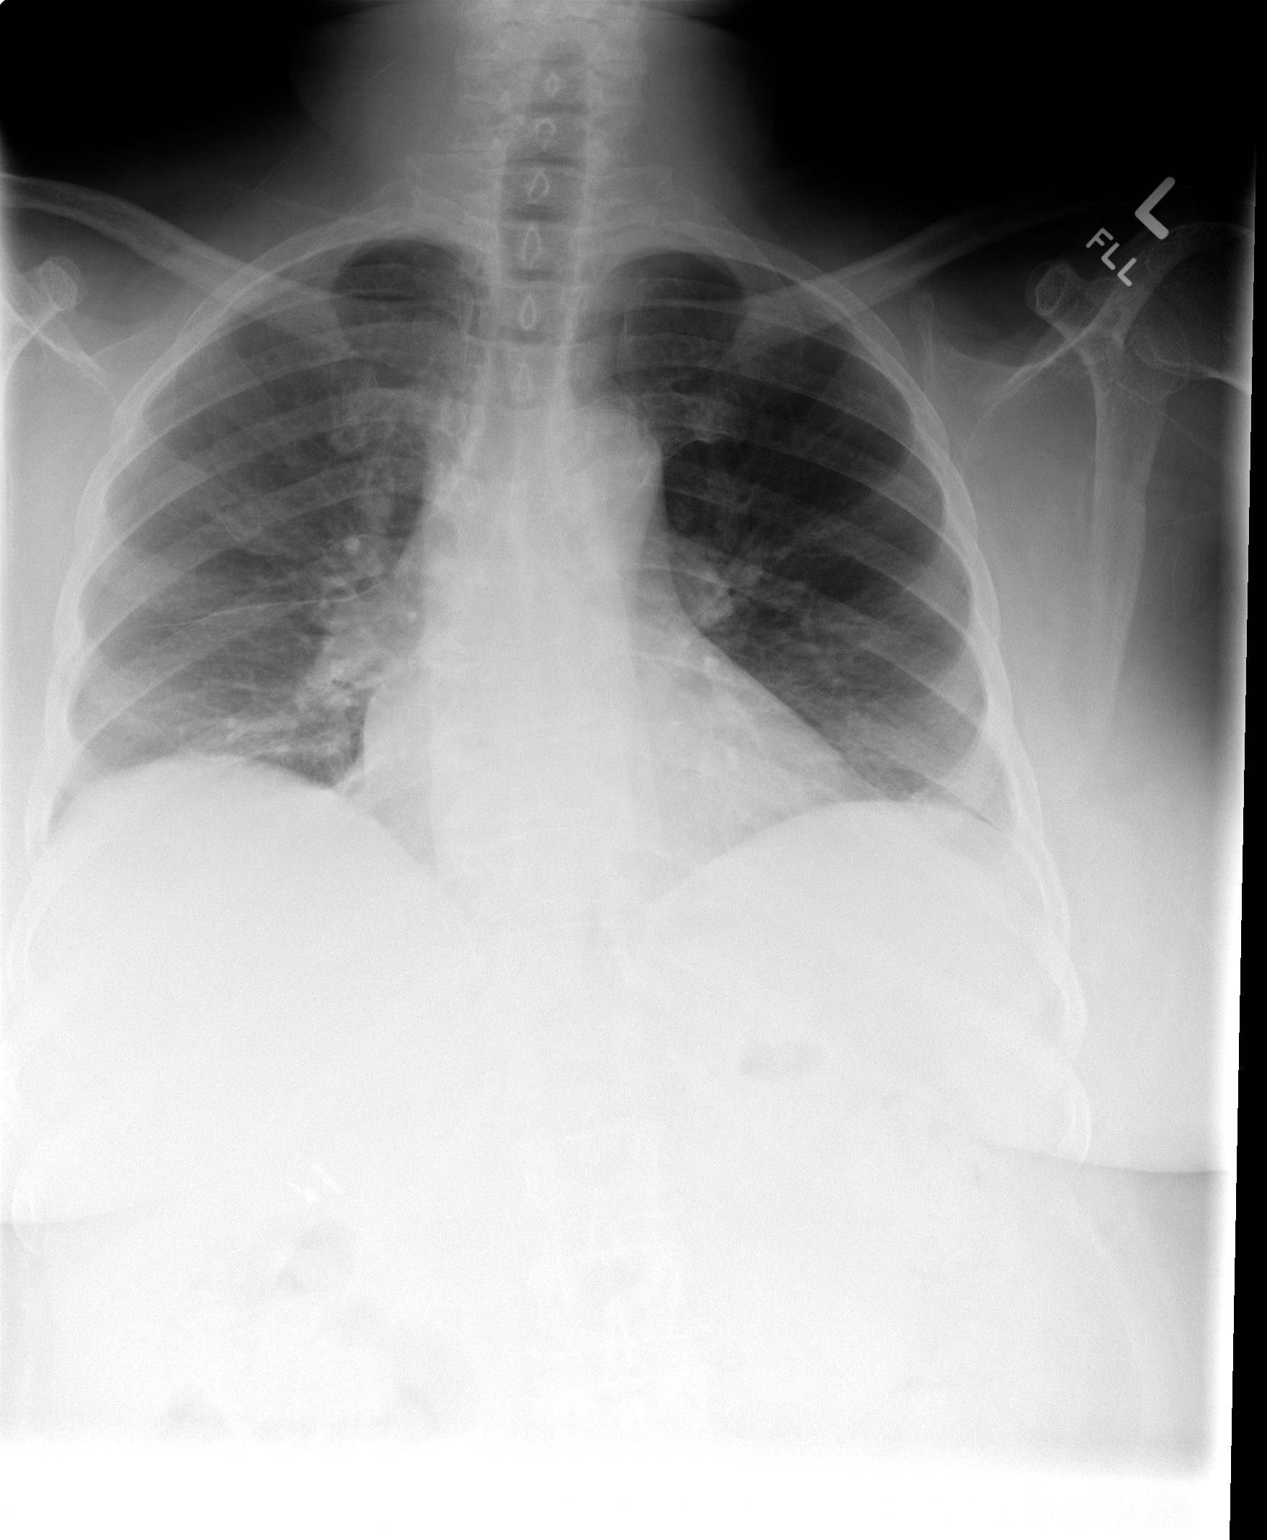

[view not recorded (2 of 2)]
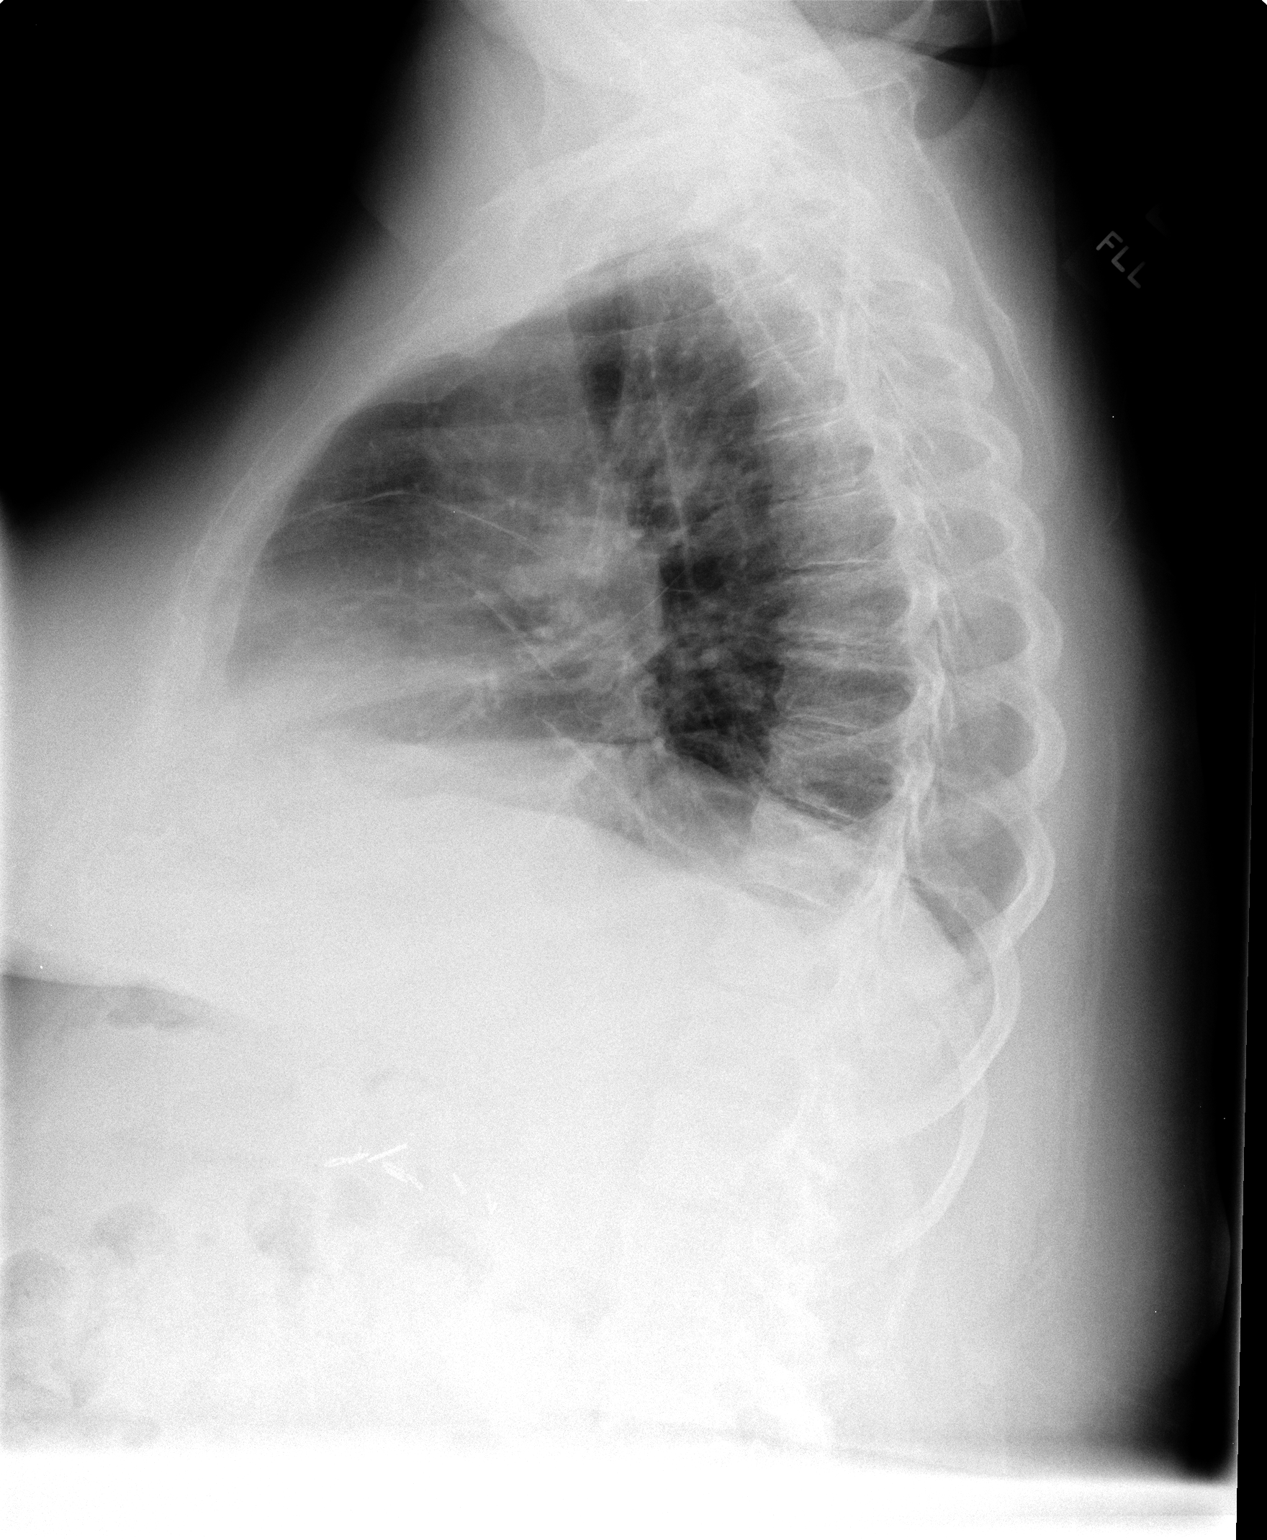

[2 of 2 positions shown; findings below may reference images not displayed]

FINDINGS: The lungs are hypoinflated. There is no focal infiltrate. There is
some crowding of lung markings in the right infrahilar region. There
is no pleural effusion. The cardiac silhouette is top-normal in size
but stable. The pulmonary vascularity is not engorged. There is
degenerative disc space narrowing at multiple thoracic levels.
IMPRESSION: The study is limited due to hypoinflation. Minimal infrahilar
atelectasis on the right is suspected.

## 2015-04-12 ENCOUNTER — Ambulatory Visit (INDEPENDENT_AMBULATORY_CARE_PROVIDER_SITE_OTHER): Payer: Commercial Managed Care - HMO | Admitting: Obstetrics and Gynecology

## 2015-04-12 ENCOUNTER — Encounter: Payer: Self-pay | Admitting: Obstetrics and Gynecology

## 2015-04-12 VITALS — BP 160/72 | Ht 61.0 in | Wt 156.0 lb

## 2015-04-12 DIAGNOSIS — I69354 Hemiplegia and hemiparesis following cerebral infarction affecting left non-dominant side: Secondary | ICD-10-CM | POA: Diagnosis not present

## 2015-04-12 DIAGNOSIS — Z90710 Acquired absence of both cervix and uterus: Secondary | ICD-10-CM

## 2015-04-12 DIAGNOSIS — K5909 Other constipation: Secondary | ICD-10-CM

## 2015-04-12 DIAGNOSIS — Z794 Long term (current) use of insulin: Secondary | ICD-10-CM | POA: Diagnosis not present

## 2015-04-12 DIAGNOSIS — J440 Chronic obstructive pulmonary disease with acute lower respiratory infection: Secondary | ICD-10-CM | POA: Diagnosis not present

## 2015-04-12 DIAGNOSIS — I10 Essential (primary) hypertension: Secondary | ICD-10-CM | POA: Diagnosis not present

## 2015-04-12 DIAGNOSIS — R260 Ataxic gait: Secondary | ICD-10-CM | POA: Diagnosis not present

## 2015-04-12 DIAGNOSIS — F17200 Nicotine dependence, unspecified, uncomplicated: Secondary | ICD-10-CM | POA: Diagnosis not present

## 2015-04-12 DIAGNOSIS — F250 Schizoaffective disorder, bipolar type: Secondary | ICD-10-CM | POA: Diagnosis not present

## 2015-04-12 DIAGNOSIS — E119 Type 2 diabetes mellitus without complications: Secondary | ICD-10-CM | POA: Diagnosis not present

## 2015-04-12 DIAGNOSIS — N8189 Other female genital prolapse: Secondary | ICD-10-CM

## 2015-04-12 DIAGNOSIS — N816 Rectocele: Secondary | ICD-10-CM | POA: Diagnosis not present

## 2015-04-12 DIAGNOSIS — M5010 Cervical disc disorder with radiculopathy, unspecified cervical region: Secondary | ICD-10-CM | POA: Diagnosis not present

## 2015-04-12 MED ORDER — POLYETHYLENE GLYCOL 3350 17 GM/SCOOP PO POWD: 17.0000 g | Freq: Every day | ORAL | Status: DC

## 2015-04-12 MED ORDER — LUBIPROSTONE 24 MCG PO CAPS: 24.0000 ug | ORAL_CAPSULE | Freq: Two times a day (BID) | ORAL | Status: DC

## 2015-04-12 NOTE — Patient Instructions (Signed)
Take Miralax 17 gm , one capful daily, along with an increased dosing of the Amitiza twice daily. Keep po fluid intake high  Return 2 wk for preOP, About Rectocele  Overview  A rectocele is a type of hernia which causes different degrees of bulging of the rectal tissues into the vaginal wall.  You may even notice that it presses against the vaginal wall so much that some vaginal tissues droop outside of the opening of your vagina.  Causes of Rectocele  The most common cause is childbirth.  The muscles and ligaments in the pelvis that hold up and support the female organs and vagina become stretched and weakened during labor and delivery.  The more babies you have, the more the support tissues are stretched and weakened.  Not everyone who has a baby will develop a rectocele.  Some women have stronger supporting tissue in the pelvis and may not have as much of a problem as others.  Women who have a Cesarean section usually do not get rectocele's unless they pushed a long time prior to the cesarean delivery.  Other conditions that can cause a rectocele include chronic constipation, a chronic cough, a lot of heavy lifting, and obesity.  Older women may have this problem because the loss of female hormones causes the vaginal tissue to become weaker.  Symptoms  There may not be any symptoms.  If you do have symptoms, they may include:  Pelvic pressure in the rectal area  Protrusion of the lower part of the vagina through the opening of the vagina  Constipation and trapping of the stool, making it difficult to have a bowel movement.  In severe cases, you may have to press on the lower part of your vagina to help push the stool out of you rectum.  This is called splinting to empty.  Diagnosing Rectocele  Your health care provider will ask about your symptoms and perform a pelvic exam.  S/he will ask you to bear down, pushing like you are having a bowel movement so as to see how far the lower part  of the vagina protrudes into the vagina and possible outside of the vagina.  Your provider will also ask you to contract the muscles of your pelvis (like you are stopping the stream in the middle of urinating) to determine the strength of your pelvic muscles.  Your provider may also do a rectal exam.  Treatment Options  If you do not have any symptoms, no treatment may be necessary.  Other treatment options include:  Pelvic floor exercises: Contracting the muscles in your genital area may help strengthen your muscles and support the organs.  Be sure to get proper exercise instruction from you physical therapist.  A pessary (removealbe pelvic support device) sometimes helps rectocele symptoms.  Surgery: Surgical repair may be necessary. In some cases the uterus may need to be taken out ( a hysterectomy) as well.  There are many types of surgery for pelvic support problems.  Look for physicians who specialize in repair procedures.  You can take care of yourself by:  Treating and preventing constipation  Avoiding heavy lifting, and lifting correctly (with your legs, not with you waist or back)  Treating a chronic cough or bronchitis  Not smoking  avoiding too much weight gain  Doing pelvic floor exercises   2007, [employer] Doc.33

## 2015-04-12 NOTE — Progress Notes (Signed)
Patient ID: SHIKIRA FOLINO, female   DOB: 07-11-1950, 65 y.o.   MRN: [id number]   [facility]  Patient name: [name] MRN [id number]  Date of birth: 1950/07/16  CC & HPI:  [name] is a 65 y.o. female presenting today for followup of chronic constipation, and pelvic relaxation with rectocele, also cystocele noted  ROS:  GI: BM q2-5 days has gone over a week without bm, has to strain to defecate., rare episodes of diarrhea. Had a hx of loose diarrhea in the distant past. Pt with sense of abd fullness on the left at times, bloating .  GU urinary urgency at times, on ditropan 5 mg TID not qd.   Pertinent History Reviewed:   Reviewed: Significant for as below, reviewed. Has been tried on Miralax in the past with minimal results. Will also increase Amitiza.  Medical         Past Medical History  Diagnosis Date  . Bipolar disorder ([place])   . CVA (cerebral infarction)   . Pancreatitis 2006    due to Depakote with normal EUS   . Osteoporosis   . Chronic back pain   . Trigger finger   . Anxiety disorder   . Hypertension   . Migraines     chronic headaches  . Diverticulosis     TCS 9/08 by Dr. Delfin Edis for diarrhea . Bx for micro scopic colitis negative.   . Schatzki's ring     non critical / EGD with ED 8/2011with RMR  . S/P colonoscopy [id number], 2011    left-sided diverticula, hx of simple adenomas . 2011, random bx negative for microscopic colitis  . Glaucoma   . Allergic rhinitis   . Hypothyroidism     thyroid goiter  . Anemia   . Blood transfusion   . GERD (gastroesophageal reflux disease)   . Stroke [facility])     left sided weakness  . Seizures ([place])     unknown etiology-on meds-last seizure was 3 yerars ago  . Anxiety   . Depression   . Metabolic encephalopathy 9/50/9326  . Sleep apnea     on CPAP  . Arthritis   . Gum symptoms     infection on antibiotic  . Chronic neck pain   . Mononeuritis lower limb   . Frequent falls   . Diabetes mellitus      Type II  . Complication of anesthesia     "hard to put sleep"  . Cervical disc disorder with radiculopathy of cervical region 10/31/2012  . COPD (chronic obstructive pulmonary disease) with chronic bronchitis ([place]) 09/16/2013    Office Spirometry 10/30/2013-submaximal effort based on appearance of loop and curve. Numbers would fit with severe restriction but her physiologic capability may be better than this. FVC 0.91/44%, and 10.74/45%, FEV1/FVC 0.81, FEF 25-75% 1.43/69%    . Carpal tunnel syndrome of right wrist 05/23/2011  . Hemiplegia affecting non-dominant side, post-stroke ([place]) 08/02/2011  . Pulmonary hypertension ([place]) 02/22/2015    45 mmHg   . LUE weakness 02/21/15    Left hand drop                              Surgical Hx:    Past Surgical History  Procedure Laterality Date  . Abdominal hysterectomy  1978  . Cholecystectomy  1984  . Ovarian cyst removal    . Carpal tunnel release Left 07/22/04  Dr. Romeo Apple  . Breast reduction surgery  1994  . Cataract extraction Bilateral   . Biopsy thyroid  2009  . Surgical excision of 3 tumors from right thigh and right buttock  and left upper thigh  2010  . Back surgery  July 2012  . Spine surgery  09/29/2010    Dr. Shon Baton  . Maloney dilation  12/29/2010    RMR;  . Esophagogastroduodenoscopy  12/29/2010    Rourk-Retained food in the esophagus and stomach, small hiatal hernia, status post Maloney dilation of the esophagus  . Craniotomy  11/23/2011    Procedure: CRANIOTOMY TUMOR EXCISION;  Surgeon: Hewitt Shorts, MD;  Location: [facility] NEURO ORS;  Service: Neurosurgery;  Laterality: N/A;  Craniotomy for tumor resection  . Brain surgery  11/2011    resection of meningioma  . Colonoscopy N/A 09/25/2012    EXA:DVYUUQM diverticulosis.  colonic polyp-removed : tubular adenoma  . Esophagogastroduodenoscopy N/A 09/25/2012    CZN:MDDLRREL atonic baggy esophagus status post Maloney dilation 56 F. Hiatal hernia  . Givens capsule study N/A  01/15/2013    NORMAL.   . Bacterial overgrowth test N/A 05/05/2013    Procedure: BACTERIAL OVERGROWTH TEST;  Surgeon: Corbin Ade, MD;  Location: [facility] ENDO SUITE;  Service: Endoscopy;  Laterality: N/A;  7:30  . Transthoracic echocardiogram  2010    EF 60-65%, mild conc LVH, grade 1 diastolic dysfunction; mildly calcified MV annulus with mildly thickened leaflets, mildly calcified MR annulus  . Nm myocar perf wall motion  2006    "relavtiely normal" persantine, mild anterior thinning (breast attenuation artifact), no region of scar/ischemia  . Cardiac catheterization  05/10/2005    normal coronaries, normal LV systolic function and EF (Dr. Evlyn Courier)  . Tooth extraction Bilateral 12/14/2014    Procedure: REMOVAL OF BILATERAL MANDIBULAR EXOSTOSES;  Surgeon: Ocie Doyne, DDS;  Location: [facility] OR;  Service: Oral Surgery;  Laterality: Bilateral;   Medications: Reviewed & Updated - see associated section                       Current outpatient prescriptions:  .  ACCU-CHEK FASTCLIX LANCETS MISC, Use to test blood sugar 4 times daily. Dx: E10.65, Disp: 408 each, Rfl: 1 .  albuterol (PROVENTIL HFA;VENTOLIN HFA) 108 (90 BASE) MCG/ACT inhaler, Inhale 1 puff into the lungs 3 (three) times daily., Disp: 1 Inhaler, Rfl: 2 .  Alcohol Swabs (B-D SINGLE USE SWABS REGULAR) PADS, Use for injections and glucose testing 8 times daily. Dx: E10.65, Disp: 900 each, Rfl: 1 .  amLODipine (NORVASC) 5 MG tablet, Take 1 tablet (5 mg total) by mouth daily., Disp: 30 tablet, Rfl: 3 .  aspirin EC 81 MG tablet, Take 81 mg by mouth daily., Disp: , Rfl:  .  Blood Glucose Calibration (ACCU-CHEK SMARTVIEW CONTROL) LIQD, 1 each by Other route as needed., Disp: 1 each, Rfl: 2 .  Blood Glucose Monitoring Suppl (ACCU-CHEK NANO SMARTVIEW) W/DEVICE KIT, 1 each by Does not apply route daily. Dx: E10.65, Disp: 1 kit, Rfl: 0 .  Cholecalciferol (D 5000) 5000 UNITS capsule, Take 5,000 Units by mouth daily., Disp: , Rfl:  .  clobetasol cream  (TEMOVATE) 0.05 %, Apply 1 application topically daily., Disp: , Rfl:  .  cloNIDine (CATAPRES) 0.3 MG tablet, TAKE 1 TABLET BY MOUTH EVERY EIGHT HOURS. ONCE AT 8AM, 4PM, AND 12 MIDNIGHT., Disp: 90 tablet, Rfl: 3 .  diclofenac sodium (VOLTAREN) 1 % GEL, Apply 2 g topically daily as needed (Pain).,  Disp: 100 g, Rfl: 0 .  dicyclomine (BENTYL) 10 MG capsule, TAKE 1 CAPSULE BY M OUTH THREE TIMES DAILY BEFORE MEALS., Disp: 90 capsule, Rfl: 3 .  docusate sodium (COLACE) 100 MG capsule, Take 2 capsules (200 mg total) by mouth at bedtime., Disp: 30 capsule, Rfl: 0 .  gentamicin (GARAMYCIN) 0.3 % ophthalmic solution, Place 1 drop into the left eye 3 (three) times daily., Disp: 5 mL, Rfl: 0 .  glucose blood (ACCU-CHEK SMARTVIEW) test strip, Use to test blood sugar 4 times daily as instructed. Dx: E10.65, Disp: 375 each, Rfl: 1 .  hydroxychloroquine (PLAQUENIL) 200 MG tablet, Take 200 mg by mouth daily., Disp: , Rfl:  .  insulin aspart (NOVOLOG FLEXPEN) 100 UNIT/ML FlexPen, Inject 14-18 Units into the skin 3 (three) times daily with meals., Disp: 30 mL, Rfl: 1 .  Insulin Glargine (TOUJEO SOLOSTAR) 300 UNIT/ML SOPN, Inject 35 Units into the skin at bedtime., Disp: 6 pen, Rfl: 1 .  Insulin Pen Needle (CLICKFINE PEN NEEDLES) 32G X 4 MM MISC, Use 4x a day, Disp: 300 each, Rfl: 3 .  lamoTRIgine (LAMICTAL) 100 MG tablet, TAKE 1 TABLET BY MOUTH TWICE DAILY., Disp: 60 tablet, Rfl: 5 .  levothyroxine (SYNTHROID, LEVOTHROID) 50 MCG tablet, TAKE 1 TABLET BY MOUTH ONCE DAILY AND 1/2 TABLET ON SUNDAYS. (Patient taking differently: Take 25-50 mcg by mouth daily. TAKE 1 TABLET BY MOUTH ONCE DAILY AND 1/2 TABLET ON SUNDAYS.), Disp: 30 tablet, Rfl: 2 .  LORazepam (ATIVAN) 0.5 MG tablet, Take 1 tablet (0.5 mg total) by mouth 3 (three) times daily., Disp: 90 tablet, Rfl: 2 .  lubiprostone (AMITIZA) 8 MCG capsule, Take 1 capsule (8 mcg total) by mouth daily after breakfast., Disp: 20 capsule, Rfl: 0 .  methocarbamol (ROBAXIN) 500 MG  tablet, Take 500 mg by mouth daily., Disp: , Rfl:  .  metoprolol (LOPRESSOR) 50 MG tablet, TAKE 1 TABLET BY MOUTH TWICE DAILY., Disp: 60 tablet, Rfl: 6 .  montelukast (SINGULAIR) 10 MG tablet, Take 1 tablet (10 mg total) by mouth daily., Disp: 30 tablet, Rfl: 2 .  Multiple Vitamins-Minerals (ONE-A-DAY 50 PLUS PO), Take 1 tablet by mouth daily., Disp: , Rfl:  .  Nystatin (NYAMYC) 100000 UNIT/GM POWD, APPLY TO AFFECTED AREA 4 TIMES DAILY., Disp: 30 g, Rfl: 2 .  oxybutynin (DITROPAN) 5 MG tablet, One tablet once daily, Disp: 30 tablet, Rfl: 3 .  pregabalin (LYRICA) 75 MG capsule, Take 75 mg by mouth 2 (two) times daily. , Disp: , Rfl:  .  promethazine (PHENERGAN) 12.5 MG tablet, Take 1 tablet (12.5 mg total) by mouth every 6 (six) hours as needed for nausea or vomiting., Disp: 30 tablet, Rfl: 0 .  promethazine-dextromethorphan (PROMETHAZINE-DM) 6.25-15 MG/5ML syrup, One teaspoon at bedtime as needed , for excess cough, Disp: 180 mL, Rfl: 0 .  RABEprazole (ACIPHEX) 20 MG tablet, TAKE 1 TABLET BY MOUTH TWICE DAILY., Disp: 60 tablet, Rfl: 3 .  RESTASIS 0.05 % ophthalmic emulsion, Place 1 drop into both eyes 2 (two) times daily. , Disp: , Rfl:  .  risperiDONE (RISPERDAL) 0.5 MG tablet, Take 1 tablet (0.5 mg total) by mouth at bedtime., Disp: 30 tablet, Rfl: 2 .  rosuvastatin (CRESTOR) 5 MG tablet, TAKE 1 TABLET BY MOUTH AT BEDTIME., Disp: 30 tablet, Rfl: 4 .  sertraline (ZOLOFT) 100 MG tablet, Take 2 tablets (200 mg total) by mouth daily., Disp: 60 tablet, Rfl: 2 .  traZODone (DESYREL) 150 MG tablet, Take 2 tablets (300 mg total) by  mouth at bedtime., Disp: 60 tablet, Rfl: 2 .  vitamin E 400 UNIT capsule, Take 400 Units by mouth daily., Disp: , Rfl:  .  ZETIA 10 MG tablet, TAKE ONE TABLET BY MOUTH ONCE DAILY., Disp: 30 tablet, Rfl: 4   Social History: Reviewed -  reports that she has been smoking Cigarettes.  She has a 3.5 pack-year smoking history. She has never used smokeless tobacco.  Objective  Findings:  Vitals: Blood pressure 160/72, height 5' 1"  (1.549 m), weight 156 lb (70.761 kg).  Physical Examination: General appearance - alert, well appearing, and in no distress, oriented to person, place, and time and normal appearing weight Mental status - alert, oriented to person, place, and time, normal mood, behavior, speech, dress, motor activity, and thought processes, affect appropriate to mood Eyes - pupils equal and reactive, extraocular eye movements intact Neck - supple, no significant adenopathy Chest - clear to auscultation, no wheezes, rales or rhonchi, symmetric air entry Heart - normal rate, regular rhythm, normal S1, S2, no murmurs, rubs, clicks or gallops, normal rate and regular rhythm Abdomen - soft, nontender, nondistended, no masses or organomegaly Breasts -  Pelvic - VULVA: normal appearing vulva with no masses, tenderness or lesions, vulvar hyperpigmentation and or lesions , VAGINA: atrophic, PELVIC FLOOR EXAM: rectocele grade 2 to introidus, cystocele mild, not currently a problem , CERVIX: surgically absent, UTERUS: surgically absent, vaginal cuff well healed, ADNEXA: normal adnexa in size, nontender and no masses, RECTAL: rectovaginal exam confirms pelvic findings, rectocele noted to 90 degree, no palpable internal organs Rectal - reduced sphincter tone, rectocele noted to 90 degree Musculoskeletal - no joint tenderness, deformity or swelling Extremities - peripheral pulses normal, no pedal edema, no clubbing or cyanosis   Assessment & Plan:   A:  1. Chronic constipation 2 rectocele, cystocele, generalized pelvic relaxation, s/p hysterectomy 3.Hx IBS  4. Hx urge incontinence 5 mult medical probs, DM  P:  1. Delay surgery til a trial of Amitiza at 24 mcg, bid, and Miralax 17 gm daily tried to see what bowel fxn will respond.  Return 2 wk to judge response, and schedule surgery(anterior and posterior repair.).

## 2015-04-15 DIAGNOSIS — I10 Essential (primary) hypertension: Secondary | ICD-10-CM | POA: Diagnosis not present

## 2015-04-15 DIAGNOSIS — J440 Chronic obstructive pulmonary disease with acute lower respiratory infection: Secondary | ICD-10-CM | POA: Diagnosis not present

## 2015-04-15 DIAGNOSIS — I69354 Hemiplegia and hemiparesis following cerebral infarction affecting left non-dominant side: Secondary | ICD-10-CM | POA: Diagnosis not present

## 2015-04-15 DIAGNOSIS — F250 Schizoaffective disorder, bipolar type: Secondary | ICD-10-CM | POA: Diagnosis not present

## 2015-04-15 DIAGNOSIS — Z794 Long term (current) use of insulin: Secondary | ICD-10-CM | POA: Diagnosis not present

## 2015-04-15 DIAGNOSIS — R260 Ataxic gait: Secondary | ICD-10-CM | POA: Diagnosis not present

## 2015-04-15 DIAGNOSIS — E119 Type 2 diabetes mellitus without complications: Secondary | ICD-10-CM | POA: Diagnosis not present

## 2015-04-15 DIAGNOSIS — M5010 Cervical disc disorder with radiculopathy, unspecified cervical region: Secondary | ICD-10-CM | POA: Diagnosis not present

## 2015-04-15 DIAGNOSIS — F17210 Nicotine dependence, cigarettes, uncomplicated: Secondary | ICD-10-CM | POA: Diagnosis not present

## 2015-04-20 ENCOUNTER — Other Ambulatory Visit: Payer: Self-pay | Admitting: *Deleted

## 2015-04-20 ENCOUNTER — Other Ambulatory Visit: Payer: Self-pay | Admitting: Internal Medicine

## 2015-04-20 DIAGNOSIS — I69354 Hemiplegia and hemiparesis following cerebral infarction affecting left non-dominant side: Secondary | ICD-10-CM | POA: Diagnosis not present

## 2015-04-20 DIAGNOSIS — E119 Type 2 diabetes mellitus without complications: Secondary | ICD-10-CM | POA: Diagnosis not present

## 2015-04-20 DIAGNOSIS — R260 Ataxic gait: Secondary | ICD-10-CM | POA: Diagnosis not present

## 2015-04-20 DIAGNOSIS — M5010 Cervical disc disorder with radiculopathy, unspecified cervical region: Secondary | ICD-10-CM | POA: Diagnosis not present

## 2015-04-20 DIAGNOSIS — J440 Chronic obstructive pulmonary disease with acute lower respiratory infection: Secondary | ICD-10-CM | POA: Diagnosis not present

## 2015-04-20 DIAGNOSIS — F250 Schizoaffective disorder, bipolar type: Secondary | ICD-10-CM | POA: Diagnosis not present

## 2015-04-20 DIAGNOSIS — F17210 Nicotine dependence, cigarettes, uncomplicated: Secondary | ICD-10-CM | POA: Diagnosis not present

## 2015-04-20 DIAGNOSIS — Z794 Long term (current) use of insulin: Secondary | ICD-10-CM | POA: Diagnosis not present

## 2015-04-20 DIAGNOSIS — I10 Essential (primary) hypertension: Secondary | ICD-10-CM | POA: Diagnosis not present

## 2015-04-21 ENCOUNTER — Other Ambulatory Visit: Payer: Self-pay | Admitting: *Deleted

## 2015-04-21 ENCOUNTER — Encounter: Payer: Self-pay | Admitting: *Deleted

## 2015-04-21 DIAGNOSIS — I69354 Hemiplegia and hemiparesis following cerebral infarction affecting left non-dominant side: Secondary | ICD-10-CM | POA: Diagnosis not present

## 2015-04-21 DIAGNOSIS — E119 Type 2 diabetes mellitus without complications: Secondary | ICD-10-CM | POA: Diagnosis not present

## 2015-04-21 DIAGNOSIS — Z794 Long term (current) use of insulin: Secondary | ICD-10-CM | POA: Diagnosis not present

## 2015-04-21 DIAGNOSIS — F17210 Nicotine dependence, cigarettes, uncomplicated: Secondary | ICD-10-CM | POA: Diagnosis not present

## 2015-04-21 DIAGNOSIS — F250 Schizoaffective disorder, bipolar type: Secondary | ICD-10-CM | POA: Diagnosis not present

## 2015-04-21 DIAGNOSIS — I10 Essential (primary) hypertension: Secondary | ICD-10-CM | POA: Diagnosis not present

## 2015-04-21 DIAGNOSIS — M5010 Cervical disc disorder with radiculopathy, unspecified cervical region: Secondary | ICD-10-CM | POA: Diagnosis not present

## 2015-04-21 DIAGNOSIS — J440 Chronic obstructive pulmonary disease with acute lower respiratory infection: Secondary | ICD-10-CM | POA: Diagnosis not present

## 2015-04-21 DIAGNOSIS — R260 Ataxic gait: Secondary | ICD-10-CM | POA: Diagnosis not present

## 2015-04-21 NOTE — Patient Outreach (Signed)
Call to patient to scheduled Home visit Scheduled for 04/22/15  [name]. Laymond Purser, RN, BSN, [place] [phone]

## 2015-04-21 NOTE — Patient Outreach (Signed)
[facility]) Care Management   04/21/2015  [name] [birth year] [id number]  [name] is an 65 y.o. female  Subjective:  "I am doing ok today, the therapist just left" Looking to move to new apartment  "I keep a coke in my fridge to treat low blood sugars"  Home Health nurse still coming weekly to fill medbox. Patient has CAP aide.  Patient reports upcoming appointments: Dr. Glo Herring on Monday Dental appointment on Tuesday   Objective:   BP 132/58 mmHg  Pulse 66  Resp 20  SpO2 98% Review of Systems  Constitutional: Negative.   HENT: Negative.   Eyes: Negative.   Respiratory: Negative.   Cardiovascular: Negative.   Gastrointestinal: Negative.   Musculoskeletal: Positive for back pain and joint pain.  Skin: Negative.   Neurological: Negative.   Psychiatric/Behavioral:       Has bi-polar diagnosis    Physical Exam  Constitutional: She is oriented to person, place, and time.  Cardiovascular: Normal rate.   Respiratory: Effort normal.  GI: Soft. Bowel sounds are normal.  Musculoskeletal: Normal range of motion.  Neurological: She is alert and oriented to person, place, and time.  Skin: Skin is warm and dry.  Psychiatric: She has a normal mood and affect. Her behavior is normal. Judgment and thought content normal.  Does see Dr. Harrington Challenger for counseling    Current Medications:   Current Outpatient Prescriptions  Medication Sig Dispense Refill  . ACCU-CHEK FASTCLIX LANCETS MISC Use to test blood sugar 4 times daily. Dx: E10.65 408 each 1  . cloNIDine (CATAPRES) 0.3 MG tablet TAKE 1 TABLET BY MOUTH EVERY EIGHT HOURS. ONCE AT 8AM, 4PM, AND 12 MIDNIGHT. 90 tablet 3  . dicyclomine (BENTYL) 10 MG capsule TAKE 1 CAPSULE BY M OUTH THREE TIMES DAILY BEFORE MEALS. 90 capsule 3  . hydroxychloroquine (PLAQUENIL) 200 MG tablet Take 200 mg by mouth daily.    . insulin aspart (NOVOLOG FLEXPEN) 100 UNIT/ML FlexPen Inject 14-18 Units into the skin 3 (three) times  daily with meals. 30 mL 1  . Insulin Glargine (TOUJEO SOLOSTAR) 300 UNIT/ML SOPN Inject 35 Units into the skin at bedtime. 6 pen 1  . lamoTRIgine (LAMICTAL) 100 MG tablet TAKE 1 TABLET BY MOUTH TWICE DAILY. 60 tablet 5  . levothyroxine (SYNTHROID, LEVOTHROID) 50 MCG tablet TAKE 1 TABLET BY MOUTH ONCE DAILY AND 1/2 TABLET ON SUNDAYS. (Patient taking differently: Take 25-50 mcg by mouth daily. TAKE 1 TABLET BY MOUTH ONCE DAILY AND 1/2 TABLET ON SUNDAYS.) 30 tablet 2  . LORazepam (ATIVAN) 0.5 MG tablet Take 1 tablet (0.5 mg total) by mouth 3 (three) times daily. 90 tablet 2  . methocarbamol (ROBAXIN) 500 MG tablet Take 500 mg by mouth daily.    . metoprolol (LOPRESSOR) 50 MG tablet TAKE 1 TABLET BY MOUTH TWICE DAILY. 60 tablet 6  . montelukast (SINGULAIR) 10 MG tablet Take 1 tablet (10 mg total) by mouth daily. 30 tablet 2  . Multiple Vitamins-Minerals (ONE-A-DAY 50 PLUS PO) Take 1 tablet by mouth daily.    [name] Nystatin (NYAMYC) 100000 UNIT/GM POWD APPLY TO AFFECTED AREA 4 TIMES DAILY. 30 g 2  . polyethylene glycol powder (MIRALAX) powder Take 17 g by mouth daily. To prevent constipation 255 g prn  . pregabalin (LYRICA) 75 MG capsule Take 75 mg by mouth 2 (two) times daily.     . RABEprazole (ACIPHEX) 20 MG tablet TAKE 1 TABLET BY MOUTH TWICE DAILY. 60 tablet 3  . RESTASIS  0.05 % ophthalmic emulsion Place 1 drop into both eyes 2 (two) times daily.     . risperiDONE (RISPERDAL) 0.5 MG tablet Take 1 tablet (0.5 mg total) by mouth at bedtime. 30 tablet 2  . rosuvastatin (CRESTOR) 5 MG tablet TAKE 1 TABLET BY MOUTH AT BEDTIME. 30 tablet 4  . sertraline (ZOLOFT) 100 MG tablet Take 2 tablets (200 mg total) by mouth daily. 60 tablet 2  . traZODone (DESYREL) 150 MG tablet Take 2 tablets (300 mg total) by mouth at bedtime. 60 tablet 2  . vitamin E 400 UNIT capsule Take 400 Units by mouth daily.    [name] ZETIA 10 MG tablet TAKE ONE TABLET BY MOUTH ONCE DAILY. 30 tablet 4  . albuterol (PROVENTIL HFA;VENTOLIN HFA)  108 (90 BASE) MCG/ACT inhaler Inhale 1 puff into the lungs 3 (three) times daily. 1 Inhaler 2  . Alcohol Swabs (B-D SINGLE USE SWABS REGULAR) PADS Use for injections and glucose testing 8 times daily. Dx: E10.65 900 each 1  . amLODipine (NORVASC) 5 MG tablet Take 1 tablet (5 mg total) by mouth daily. 30 tablet 3  . aspirin EC 81 MG tablet Take 81 mg by mouth daily.    . BD PEN NEEDLE NANO U/F 32G X 4 MM MISC USE FOUR TIMES DAILY 360 each 3  . Blood Glucose Calibration (ACCU-CHEK SMARTVIEW CONTROL) LIQD 1 each by Other route as needed. 1 each 2  . Blood Glucose Monitoring Suppl (ACCU-CHEK NANO SMARTVIEW) W/DEVICE KIT 1 each by Does not apply route daily. Dx: E10.65 1 kit 0  . Cholecalciferol (D 5000) 5000 UNITS capsule Take 5,000 Units by mouth daily.    . clobetasol cream (TEMOVATE) 5.27 % Apply 1 application topically daily.    . diclofenac sodium (VOLTAREN) 1 % GEL Apply 2 g topically daily as needed (Pain). 100 g 0  . gentamicin (GARAMYCIN) 0.3 % ophthalmic solution Place 1 drop into the left eye 3 (three) times daily. 5 mL 0  . glucose blood (ACCU-CHEK SMARTVIEW) test strip Use to test blood sugar 4 times daily as instructed. Dx: E10.65 375 each 1  . lubiprostone (AMITIZA) 24 MCG capsule Take 1 capsule (24 mcg total) by mouth 2 (two) times daily with a meal. (Patient not taking: Reported on 04/21/2015) 60 capsule 2  . oxybutynin (DITROPAN) 5 MG tablet One tablet once daily (Patient not taking: Reported on 04/21/2015) 30 tablet 3  . promethazine (PHENERGAN) 12.5 MG tablet Take 1 tablet (12.5 mg total) by mouth every 6 (six) hours as needed for nausea or vomiting. (Patient not taking: Reported on 04/21/2015) 30 tablet 0  . promethazine-dextromethorphan (PROMETHAZINE-DM) 6.25-15 MG/5ML syrup One teaspoon at bedtime as needed , for excess cough (Patient not taking: Reported on 04/21/2015) 180 mL 0   No current facility-administered medications for this visit.    Functional Status:   In your present  state of health, do you have any difficulty performing the following activities: 04/21/2015 03/19/2015  Hearing? N N  Vision? N Y  Difficulty concentrating or making decisions? Y N  Walking or climbing stairs? Y Y  Dressing or bathing? Y Y  Doing errands, shopping? [place]pie Donning    Fall/Depression Screening:    Fall Risk  04/21/2015 03/19/2015 02/10/2015 02/03/2015 01/05/2015  Falls in the past year? Yes Yes Yes Yes Yes  Number falls in past yr: 2 or more 2 or more 2 or more 2 or more 2 or more  Injury with Fall? Yes Yes Yes Yes Yes  Risk Factor Category  High Fall Risk High Fall Risk High Fall Risk High Fall Risk High Fall Risk  Risk for fall due to : History of fall(s) History of fall(s);Impaired balance/gait;Impaired mobility Impaired mobility;Impaired balance/gait - -  Follow up - Falls evaluation completed Falls prevention discussed Falls evaluation completed -   PHQ 2/9 Scores 04/21/2015 03/19/2015 02/10/2015  PHQ - 2 Score 1 2 0  PHQ- 9 Score - 9 -    Assessment:   Diabetes-patient logging CBGs  Plan:  [facility] CM Care Plan Problem One        Most Recent Value   Care Plan Problem One  Diabetes-knowledge deficit    Role Documenting the Problem One  Care Management Coordinator   Care Plan for Problem One  Active   [facility] Long Term Goal (31-90 days)  Patient will be able to verbalize signs and symptoms of low blood sugar within the next 21 days    [facility] Long Term Goal Start Date  03/19/15   [facility] Long Term Goal Met Date  04/21/15   Interventions for Problem One Long Term Goal  Using teachback, reviewed with patient how to treat low blood sugar,   [facility] CM Short Term Goal #1 Start Date  04/21/15   Interventions for Short Term Goal #1  Using teachback, reviewed medications, reviewed cbg log, encouraged to keep appt with endocrinologist, Had patient describe how to treat low blood sugar    [facility] CM Care Plan Problem Two        Most Recent Value   Care Plan Problem Two  High risk for fall as evidenced by falls  over past year   Role Documenting the Problem Two  Care Management [place] for Problem Two  Active   Interventions for Problem Two Long Term Goal   Reviewed safety, use of DME/Cane, taking up throw rugs, encouraged to where shoes    [facility] Long Term Goal (31-90) days  Patient will not have any falls over the next 31 days   [facility] Long Term Goal Start Date  04/21/15 [restarted goal]     Will visit again in a month and plan to discharge    [place]. Laymond Purser, RN, BSN, [place] [phone]

## 2015-04-23 ENCOUNTER — Telehealth: Payer: Self-pay | Admitting: *Deleted

## 2015-04-23 DIAGNOSIS — F17210 Nicotine dependence, cigarettes, uncomplicated: Secondary | ICD-10-CM | POA: Diagnosis not present

## 2015-04-23 DIAGNOSIS — I69354 Hemiplegia and hemiparesis following cerebral infarction affecting left non-dominant side: Secondary | ICD-10-CM | POA: Diagnosis not present

## 2015-04-23 DIAGNOSIS — Z794 Long term (current) use of insulin: Secondary | ICD-10-CM | POA: Diagnosis not present

## 2015-04-23 DIAGNOSIS — R260 Ataxic gait: Secondary | ICD-10-CM | POA: Diagnosis not present

## 2015-04-23 DIAGNOSIS — M5010 Cervical disc disorder with radiculopathy, unspecified cervical region: Secondary | ICD-10-CM | POA: Diagnosis not present

## 2015-04-23 DIAGNOSIS — E119 Type 2 diabetes mellitus without complications: Secondary | ICD-10-CM | POA: Diagnosis not present

## 2015-04-23 DIAGNOSIS — I10 Essential (primary) hypertension: Secondary | ICD-10-CM | POA: Diagnosis not present

## 2015-04-23 DIAGNOSIS — F250 Schizoaffective disorder, bipolar type: Secondary | ICD-10-CM | POA: Diagnosis not present

## 2015-04-23 DIAGNOSIS — J440 Chronic obstructive pulmonary disease with acute lower respiratory infection: Secondary | ICD-10-CM | POA: Diagnosis not present

## 2015-04-23 NOTE — Telephone Encounter (Signed)
Please decrease: - Toujeo from 35 >> 25 units at bedtime - Novolog:  14 >> 10 units with a smaller meal  18 >> 14 units with a larger meal

## 2015-04-23 NOTE — Telephone Encounter (Signed)
Home health nurse, [name], pt is having fasting lows in the 30's approx 1 time a week. She is concerned for pt and wants to know if Dr Cruzita Lederer wants to make any changes to her Toujeo. Please advise.

## 2015-04-23 NOTE — Telephone Encounter (Signed)
Called pt and advised her per Dr Arman Filter message. Pt stated she wrote the changes down and voiced understanding. Advised pt to call back on Monday to let me know how she is doing. Pt understood.

## 2015-04-26 ENCOUNTER — Telehealth: Payer: Self-pay | Admitting: Internal Medicine

## 2015-04-26 ENCOUNTER — Encounter: Payer: Self-pay | Admitting: Obstetrics and Gynecology

## 2015-04-26 ENCOUNTER — Ambulatory Visit (INDEPENDENT_AMBULATORY_CARE_PROVIDER_SITE_OTHER): Payer: Commercial Managed Care - HMO | Admitting: Obstetrics and Gynecology

## 2015-04-26 VITALS — BP 130/76 | Ht 61.0 in | Wt 160.0 lb

## 2015-04-26 DIAGNOSIS — I272 Other secondary pulmonary hypertension: Secondary | ICD-10-CM

## 2015-04-26 DIAGNOSIS — N816 Rectocele: Secondary | ICD-10-CM | POA: Diagnosis not present

## 2015-04-26 DIAGNOSIS — R159 Full incontinence of feces: Secondary | ICD-10-CM

## 2015-04-26 NOTE — Telephone Encounter (Signed)
Ok to see NP 

## 2015-04-26 NOTE — Telephone Encounter (Signed)
Patient scheduled to see Rexene Edison, NP on Monday 2/20 at 2:30pm.  Patient advised to arrive a few minutes early for her appointment for check in. Nothing further needed.

## 2015-04-26 NOTE — Telephone Encounter (Signed)
Spoke with [name].  C/o increased SOB x 2 months and wheezing qhs.  Denies cough and chest tightness.  [name] states she is using albuterol hfa with relief.  [name] states she was told a while back the drs "thought she had pna."  With recent increased SOB, [name] is requesting OV sooner than 1st available in May to have SOB rechecked with a "breathing test" and a "test to check the spot."  [name], please advise if [name] can be worked in.  Thank you!  Last OV with CY: 09/07/14, asked to f/u in 6 months. No pending appt  [facility]  Allergies  Allergen Reactions  . Cephalexin Hives  . Iron Nausea And Vomiting  . Milk-Related Compounds Other (See Comments)    Doesn't agree with stomach.   . Penicillins Hives    Has patient had a PCN reaction causing immediate rash, facial/tongue/throat swelling, SOB or lightheadedness with hypotension: Yes Has patient had a PCN reaction causing severe rash involving mucus membranes or skin necrosis: No Has patient had a PCN reaction that required hospitalization No Has patient had a PCN reaction occurring within the last 10 years: No If all of the above answers are "NO", then may proceed with Cephalosporin use.   [name] Phenazopyridine Hcl Hives           Current Outpatient Prescriptions on File Prior to Visit  Medication Sig Dispense Refill  . ACCU-CHEK FASTCLIX LANCETS MISC Use to test blood sugar 4 times daily. Dx: E10.65 408 each 1  . albuterol (PROVENTIL HFA;VENTOLIN HFA) 108 (90 BASE) MCG/ACT inhaler Inhale 1 puff into the lungs 3 (three) times daily. 1 Inhaler 2  . Alcohol Swabs (B-D SINGLE USE SWABS REGULAR) PADS Use for injections and glucose testing 8 times daily. Dx: E10.65 900 each 1  . amLODipine (NORVASC) 5 MG tablet Take 1 tablet (5 mg total) by mouth daily. 30 tablet 3  . aspirin EC 81 MG tablet Take 81 mg by mouth daily.    . BD PEN NEEDLE NANO U/F 32G X 4 MM MISC USE FOUR TIMES DAILY 360 each 3  . Blood Glucose Calibration (ACCU-CHEK SMARTVIEW  CONTROL) LIQD 1 each by Other route as needed. 1 each 2  . Blood Glucose Monitoring Suppl (ACCU-CHEK NANO SMARTVIEW) W/DEVICE KIT 1 each by Does not apply route daily. Dx: E10.65 1 kit 0  . Cholecalciferol (D 5000) 5000 UNITS capsule Take 5,000 Units by mouth daily.    . clobetasol cream (TEMOVATE) 6.50 % Apply 1 application topically daily.    . cloNIDine (CATAPRES) 0.3 MG tablet TAKE 1 TABLET BY MOUTH EVERY EIGHT HOURS. ONCE AT 8AM, 4PM, AND 12 MIDNIGHT. 90 tablet 3  . diclofenac sodium (VOLTAREN) 1 % GEL Apply 2 g topically daily as needed (Pain). 100 g 0  . dicyclomine (BENTYL) 10 MG capsule TAKE 1 CAPSULE BY M OUTH THREE TIMES DAILY BEFORE MEALS. 90 capsule 3  . gentamicin (GARAMYCIN) 0.3 % ophthalmic solution Place 1 drop into the left eye 3 (three) times daily. 5 mL 0  . glucose blood (ACCU-CHEK SMARTVIEW) test strip Use to test blood sugar 4 times daily as instructed. Dx: E10.65 375 each 1  . hydroxychloroquine (PLAQUENIL) 200 MG tablet Take 200 mg by mouth daily.    . insulin aspart (NOVOLOG FLEXPEN) 100 UNIT/ML FlexPen Inject 14-18 Units into the skin 3 (three) times daily with meals. 30 mL 1  . Insulin Glargine (TOUJEO SOLOSTAR) 300 UNIT/ML SOPN Inject 35 Units into the skin  at bedtime. 6 pen 1  . lamoTRIgine (LAMICTAL) 100 MG tablet TAKE 1 TABLET BY MOUTH TWICE DAILY. 60 tablet 5  . levothyroxine (SYNTHROID, LEVOTHROID) 50 MCG tablet TAKE 1 TABLET BY MOUTH ONCE DAILY AND 1/2 TABLET ON SUNDAYS. (Patient taking differently: Take 25-50 mcg by mouth daily. TAKE 1 TABLET BY MOUTH ONCE DAILY AND 1/2 TABLET ON SUNDAYS.) 30 tablet 2  . LORazepam (ATIVAN) 0.5 MG tablet Take 1 tablet (0.5 mg total) by mouth 3 (three) times daily. 90 tablet 2  . lubiprostone (AMITIZA) 24 MCG capsule Take 1 capsule (24 mcg total) by mouth 2 (two) times daily with a meal. (Patient not taking: Reported on 04/21/2015) 60 capsule 2  . methocarbamol (ROBAXIN) 500 MG tablet Take 500 mg by mouth daily.    . metoprolol  (LOPRESSOR) 50 MG tablet TAKE 1 TABLET BY MOUTH TWICE DAILY. 60 tablet 6  . montelukast (SINGULAIR) 10 MG tablet Take 1 tablet (10 mg total) by mouth daily. 30 tablet 2  . Multiple Vitamins-Minerals (ONE-A-DAY 50 PLUS PO) Take 1 tablet by mouth daily.    [name] Nystatin (NYAMYC) 100000 UNIT/GM POWD APPLY TO AFFECTED AREA 4 TIMES DAILY. 30 g 2  . oxybutynin (DITROPAN) 5 MG tablet One tablet once daily (Patient not taking: Reported on 04/21/2015) 30 tablet 3  . polyethylene glycol powder (MIRALAX) powder Take 17 g by mouth daily. To prevent constipation 255 g prn  . pregabalin (LYRICA) 75 MG capsule Take 75 mg by mouth 2 (two) times daily.     . promethazine (PHENERGAN) 12.5 MG tablet Take 1 tablet (12.5 mg total) by mouth every 6 (six) hours as needed for nausea or vomiting. (Patient not taking: Reported on 04/21/2015) 30 tablet 0  . promethazine-dextromethorphan (PROMETHAZINE-DM) 6.25-15 MG/5ML syrup One teaspoon at bedtime as needed , for excess cough (Patient not taking: Reported on 04/21/2015) 180 mL 0  . RABEprazole (ACIPHEX) 20 MG tablet TAKE 1 TABLET BY MOUTH TWICE DAILY. 60 tablet 3  . RESTASIS 0.05 % ophthalmic emulsion Place 1 drop into both eyes 2 (two) times daily.     . risperiDONE (RISPERDAL) 0.5 MG tablet Take 1 tablet (0.5 mg total) by mouth at bedtime. 30 tablet 2  . rosuvastatin (CRESTOR) 5 MG tablet TAKE 1 TABLET BY MOUTH AT BEDTIME. 30 tablet 4  . sertraline (ZOLOFT) 100 MG tablet Take 2 tablets (200 mg total) by mouth daily. 60 tablet 2  . traZODone (DESYREL) 150 MG tablet Take 2 tablets (300 mg total) by mouth at bedtime. 60 tablet 2  . vitamin E 400 UNIT capsule Take 400 Units by mouth daily.    [name] ZETIA 10 MG tablet TAKE ONE TABLET BY MOUTH ONCE DAILY. 30 tablet 4   No current facility-administered medications on file prior to visit.

## 2015-04-26 NOTE — Progress Notes (Signed)
[facility] Visit  Patient name: [name] MRN [id number]  Date of birth: 03-24-1950  Preoperative History and Physical  [name] is a 65 y.o. [phone] here for surgical management of rectal incontinence and rectocele due to incomplete evacuation.   No significant preoperative concerns.  Pt is presenting today for follow-up regarding rectocele s/p abdominal hysterectomy, performed by Dr. Katherina Right about 37 years ago. Patient complains of bowel incontinence, with 2 episodes yesterday, as well as bladder incontinence - she states she regularly wears Depends underpants. Patient states her incontinent stools are soft but not runny. Patient last had a BM 2 days ago. She describes her stools then as "wanting to go into diarrhea, bu t it drains."  Patient notes a history of spinal surgery, and she is scheduled to have another surgery.   Patient states she has gained weight recently due to excessive sugar consumption.   Proposed surgery: Posterior repair  Past Medical History  Diagnosis Date  . Bipolar disorder ([place])   . CVA (cerebral infarction)   . Pancreatitis 2006    due to Depakote with normal EUS   . Osteoporosis   . Chronic back pain   . Trigger finger   . Anxiety disorder   . Hypertension   . Migraines     chronic headaches  . Diverticulosis     TCS 9/08 by Dr. Delfin Edis for diarrhea . Bx for micro scopic colitis negative.   . Schatzki's ring     non critical / EGD with ED 8/2011with RMR  . S/P colonoscopy [id number], 2011    left-sided diverticula, hx of simple adenomas . 2011, random bx negative for microscopic colitis  . Glaucoma   . Allergic rhinitis   . Hypothyroidism     thyroid goiter  . Anemia   . Blood transfusion   . GERD (gastroesophageal reflux disease)   . Stroke [facility])     left sided weakness  . Seizures ([place])     unknown etiology-on meds-last seizure was 3 yerars ago  . Anxiety   . Depression   . Metabolic encephalopathy 3/38/2505  . Sleep  apnea     on CPAP  . Arthritis   . Gum symptoms     infection on antibiotic  . Chronic neck pain   . Mononeuritis lower limb   . Frequent falls   . Diabetes mellitus     Type II  . Complication of anesthesia     "hard to put sleep"  . Cervical disc disorder with radiculopathy of cervical region 10/31/2012  . COPD (chronic obstructive pulmonary disease) with chronic bronchitis ([place]) 09/16/2013    Office Spirometry 10/30/2013-submaximal effort based on appearance of loop and curve. Numbers would fit with severe restriction but her physiologic capability may be better than this. FVC 0.91/44%, and 10.74/45%, FEV1/FVC 0.81, FEF 25-75% 1.43/69%    . Carpal tunnel syndrome of right wrist 05/23/2011  . Hemiplegia affecting non-dominant side, post-stroke ([place]) 08/02/2011  . Pulmonary hypertension ([place]) 02/22/2015    45 mmHg   . LUE weakness 02/21/15    Left hand drop   Past Surgical History  Procedure Laterality Date  . Abdominal hysterectomy  1978  . Cholecystectomy  1984  . Ovarian cyst removal    . Carpal tunnel release Left 07/22/04    Dr. Aline Brochure  . Breast reduction surgery  1994  . Cataract extraction Bilateral   . Biopsy thyroid  2009  . Surgical excision of 3 tumors from  right thigh and right buttock  and left upper thigh  2010  . Back surgery  July 2012  . Spine surgery  09/29/2010    Dr. Rolena Infante  . Maloney dilation  12/29/2010    RMR;  . Esophagogastroduodenoscopy  12/29/2010    Rourk-Retained food in the esophagus and stomach, small hiatal hernia, status post Maloney dilation of the esophagus  . Craniotomy  11/23/2011    Procedure: CRANIOTOMY TUMOR EXCISION;  Surgeon: Hosie Spangle, MD;  Location: [facility];  Service: Neurosurgery;  Laterality: N/A;  Craniotomy for tumor resection  . Brain surgery  11/2011    resection of meningioma  . Colonoscopy N/A 09/25/2012    PYP:[id number] diverticulosis.  colonic polyp-removed : tubular adenoma  . Esophagogastroduodenoscopy N/A  09/25/2012    [id number] atonic baggy esophagus status post Maloney dilation 27 F. Hiatal hernia  . Givens capsule study N/A 01/15/2013    NORMAL.   . Bacterial overgrowth test N/A 05/05/2013    Procedure: BACTERIAL OVERGROWTH TEST;  Surgeon: Daneil Dolin, MD;  [facility];  Service: Endoscopy;  Laterality: N/A;  7:30  . Transthoracic echocardiogram  2010    EF 60-65%, mild conc LVH, grade 1 diastolic dysfunction; mildly calcified MV annulus with mildly thickened leaflets, mildly calcified MR annulus  . Nm myocar perf wall motion  2006    "relavtiely normal" persantine, mild anterior thinning (breast attenuation artifact), no region of scar/ischemia  . Cardiac catheterization  05/10/2005    normal coronaries, normal LV systolic function and EF (Dr. Jackie Plum)  . Tooth extraction Bilateral 12/14/2014    Procedure: REMOVAL OF BILATERAL MANDIBULAR EXOSTOSES;  Surgeon: Diona Browner, DDS;  Location: [facility];  Service: Oral Surgery;  Laterality: Bilateral;   OB History  Gravida Para Term Preterm AB SAB TAB Ectopic Multiple Living  6 1 1  5 5         # Outcome Date GA Lbr Len/2nd Weight Sex Delivery Anes PTL Lv  6 SAB           5 SAB           4 SAB           3 SAB           2 SAB           1 Term             Patient denies any other pertinent gynecologic issues.   Current Outpatient Prescriptions on File Prior to Visit  Medication Sig Dispense Refill  . ACCU-CHEK FASTCLIX LANCETS MISC Use to test blood sugar 4 times daily. Dx: E10.65 408 each 1  . albuterol (PROVENTIL HFA;VENTOLIN HFA) 108 (90 BASE) MCG/ACT inhaler Inhale 1 puff into the lungs 3 (three) times daily. 1 Inhaler 2  . Alcohol Swabs (B-D SINGLE USE SWABS REGULAR) PADS Use for injections and glucose testing 8 times daily. Dx: E10.65 900 each 1  . amLODipine (NORVASC) 5 MG tablet Take 1 tablet (5 mg total) by mouth daily. 30 tablet 3  . aspirin EC 81 MG tablet Take 81 mg by mouth daily.    . BD PEN NEEDLE NANO U/F 32G X  4 MM MISC USE FOUR TIMES DAILY 360 each 3  . Blood Glucose Calibration (ACCU-CHEK SMARTVIEW CONTROL) LIQD 1 each by Other route as needed. 1 each 2  . Blood Glucose Monitoring Suppl (ACCU-CHEK NANO SMARTVIEW) W/DEVICE KIT 1 each by Does not apply route daily. Dx: E10.65 1 kit  0  . Cholecalciferol (D 5000) 5000 UNITS capsule Take 5,000 Units by mouth daily.    . clobetasol cream (TEMOVATE) 5.27 % Apply 1 application topically daily.    . cloNIDine (CATAPRES) 0.3 MG tablet TAKE 1 TABLET BY MOUTH EVERY EIGHT HOURS. ONCE AT 8AM, 4PM, AND 12 MIDNIGHT. 90 tablet 3  . diclofenac sodium (VOLTAREN) 1 % GEL Apply 2 g topically daily as needed (Pain). 100 g 0  . dicyclomine (BENTYL) 10 MG capsule TAKE 1 CAPSULE BY M OUTH THREE TIMES DAILY BEFORE MEALS. 90 capsule 3  . gentamicin (GARAMYCIN) 0.3 % ophthalmic solution Place 1 drop into the left eye 3 (three) times daily. 5 mL 0  . glucose blood (ACCU-CHEK SMARTVIEW) test strip Use to test blood sugar 4 times daily as instructed. Dx: E10.65 375 each 1  . hydroxychloroquine (PLAQUENIL) 200 MG tablet Take 200 mg by mouth daily.    . insulin aspart (NOVOLOG FLEXPEN) 100 UNIT/ML FlexPen Inject 14-18 Units into the skin 3 (three) times daily with meals. 30 mL 1  . Insulin Glargine (TOUJEO SOLOSTAR) 300 UNIT/ML SOPN Inject 35 Units into the skin at bedtime. 6 pen 1  . lamoTRIgine (LAMICTAL) 100 MG tablet TAKE 1 TABLET BY MOUTH TWICE DAILY. 60 tablet 5  . levothyroxine (SYNTHROID, LEVOTHROID) 50 MCG tablet TAKE 1 TABLET BY MOUTH ONCE DAILY AND 1/2 TABLET ON SUNDAYS. (Patient taking differently: Take 25-50 mcg by mouth daily. TAKE 1 TABLET BY MOUTH ONCE DAILY AND 1/2 TABLET ON SUNDAYS.) 30 tablet 2  . LORazepam (ATIVAN) 0.5 MG tablet Take 1 tablet (0.5 mg total) by mouth 3 (three) times daily. 90 tablet 2  . lubiprostone (AMITIZA) 24 MCG capsule Take 1 capsule (24 mcg total) by mouth 2 (two) times daily with a meal. (Patient not taking: Reported on 04/21/2015) 60 capsule 2   . methocarbamol (ROBAXIN) 500 MG tablet Take 500 mg by mouth daily.    . metoprolol (LOPRESSOR) 50 MG tablet TAKE 1 TABLET BY MOUTH TWICE DAILY. 60 tablet 6  . montelukast (SINGULAIR) 10 MG tablet Take 1 tablet (10 mg total) by mouth daily. 30 tablet 2  . Multiple Vitamins-Minerals (ONE-A-DAY 50 PLUS PO) Take 1 tablet by mouth daily.    [name] Nystatin (NYAMYC) 100000 UNIT/GM POWD APPLY TO AFFECTED AREA 4 TIMES DAILY. 30 g 2  . oxybutynin (DITROPAN) 5 MG tablet One tablet once daily (Patient not taking: Reported on 04/21/2015) 30 tablet 3  . polyethylene glycol powder (MIRALAX) powder Take 17 g by mouth daily. To prevent constipation 255 g prn  . pregabalin (LYRICA) 75 MG capsule Take 75 mg by mouth 2 (two) times daily.     . promethazine (PHENERGAN) 12.5 MG tablet Take 1 tablet (12.5 mg total) by mouth every 6 (six) hours as needed for nausea or vomiting. (Patient not taking: Reported on 04/21/2015) 30 tablet 0  . promethazine-dextromethorphan (PROMETHAZINE-DM) 6.25-15 MG/5ML syrup One teaspoon at bedtime as needed , for excess cough (Patient not taking: Reported on 04/21/2015) 180 mL 0  . RABEprazole (ACIPHEX) 20 MG tablet TAKE 1 TABLET BY MOUTH TWICE DAILY. 60 tablet 3  . RESTASIS 0.05 % ophthalmic emulsion Place 1 drop into both eyes 2 (two) times daily.     . risperiDONE (RISPERDAL) 0.5 MG tablet Take 1 tablet (0.5 mg total) by mouth at bedtime. 30 tablet 2  . rosuvastatin (CRESTOR) 5 MG tablet TAKE 1 TABLET BY MOUTH AT BEDTIME. 30 tablet 4  . sertraline (ZOLOFT) 100 MG tablet Take 2  tablets (200 mg total) by mouth daily. 60 tablet 2  . traZODone (DESYREL) 150 MG tablet Take 2 tablets (300 mg total) by mouth at bedtime. 60 tablet 2  . vitamin E 400 UNIT capsule Take 400 Units by mouth daily.    [name] ZETIA 10 MG tablet TAKE ONE TABLET BY MOUTH ONCE DAILY. 30 tablet 4   No current facility-administered medications on file prior to visit.   Allergies  Allergen Reactions  . Cephalexin Hives  . Iron  Nausea And Vomiting  . Milk-Related Compounds Other (See Comments)    Doesn't agree with stomach.   . Penicillins Hives    Has patient had a PCN reaction causing immediate rash, facial/tongue/throat swelling, SOB or lightheadedness with hypotension: Yes Has patient had a PCN reaction causing severe rash involving mucus membranes or skin necrosis: No Has patient had a PCN reaction that required hospitalization No Has patient had a PCN reaction occurring within the last 10 years: No If all of the above answers are "NO", then may proceed with Cephalosporin use.   [name] Phenazopyridine Hcl Hives          Social History:   reports that she has been smoking Cigarettes.  She has a 3.5 pack-year smoking history. She has never used smokeless tobacco. She reports that she does not drink alcohol or use illicit drugs.  Family History  Problem Relation Age of Onset  . Heart attack Mother     HTN  . Pneumonia Father   . Kidney failure Father   . Diabetes Father   . Pancreatic cancer Sister   . Diabetes Brother   . Hypertension Brother   . Diabetes Brother   . Colon cancer Neg Hx   . Anesthesia problems Neg Hx   . Hypotension Neg Hx   . Malignant hyperthermia Neg Hx   . Pseudochol deficiency Neg Hx   . Stroke Maternal Grandmother   . Heart attack Maternal Grandfather   . Cancer Sister     breast   . Alcohol abuse Maternal Uncle   . Hypertension Brother   . Hypertension Son   . Sleep apnea Son     Review of Systems: Noncontributory  PHYSICAL EXAM: Blood pressure 130/76, height '5\' 1"'$  (1.549 m), weight 160 lb (72.576 kg). General appearance - alert, well appearing, and in no distress Chest - clear to auscultation, no wheezes, rales or rhonchi, symmetric air entry Heart - normal rate and regular rhythm Abdomen - soft, nontender, nondistended, no masses or organomegaly                      Pelvic - examination  VAGINA: Short vaginal length, atrophic, adequate support of vaginal apex, with  no masses The perineum can be lifted up voluntarily with Kegel exercise, but resting position allows a large grade 3 rectocele protruding to introitus.   RECTAL:  Forward perineal laxity, good sphincter tone, large rectocele on digital rectal to >90degrees.  Extremities - peripheral pulses normal, no pedal edema, no clubbing or cyanosis  Labs: No results found. However, due to the size of the patient record, not all encounters were searched. Please check Results Review for a complete set of results.  Imaging Studies: No results found.  Assessment: Patient Active Problem List   Diagnosis Date Noted  . Pelvic relaxation due to rectocele 03/30/2015  . Hospital discharge follow-up 03/02/2015  . Conjunctivitis of left eye 03/02/2015  . Hyperkalemia 02/22/2015  . Pulmonary hypertension ([place]) 02/22/2015  .  LUE weakness   . Chest pain 02/21/2015  . Chest tightness 02/21/2015  . Weakness of left upper extremity 02/21/2015  . AKI (acute kidney injury) ([place]) 02/21/2015  . Acute bronchitis 02/21/2015  . Light cigarette smoker (1-9 cigarettes per day) 01/11/2015  . Annual physical exam 01/05/2015  . Rib pain on left side 12/30/2014  . Light headedness 10/20/2014  . Migraine without aura and without status migrainosus, not intractable 07/02/2014  . Flatulence 02/18/2014  . Microcytic anemia 02/18/2014  . COPD (chronic obstructive pulmonary disease) with chronic bronchitis ([place]) 09/16/2013  . Hypothyroidism 08/16/2013  . Gastroparesis 04/28/2013  . Seizure disorder ([place]) 01/19/2013  . Diabetes mellitus type 2 in obese ([place]) 11/28/2012  . Cervical disc disorder with radiculopathy of cervical region 10/31/2012  . Solitary pulmonary nodule 08/19/2012  . Anemia 07/05/2012  . 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 Allergic sinusitis 04/18/2012  . Meningioma ([place]) 11/19/2011  . 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 Hemiplegia affecting non-dominant side, post-stroke  ([place]) 08/02/2011  . Carpal tunnel syndrome of right wrist 05/23/2011  . Polypharmacy 04/28/2011  . Bipolar disorder ([place]) 04/28/2011  . Constipation 04/13/2011  . Falls frequently 12/12/2010  . Urinary incontinence 12/16/2009  . HEARING LOSS 10/26/2009  . Hyperlipidemia 12/11/2008  . IBS 12/11/2008  . GERD 07/29/2008  . MILK PRODUCTS ALLERGY 07/29/2008  . 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 INSOMNIA 11/03/2007  . Essential hypertension 06/27/2007  . Backache 06/19/2007  . Osteoporosis 06/19/2007  . Obstructive sleep apnea 06/19/2007  . TRIGGER FINGER 04/18/2007  . DIVERTICULOSIS, COLON 11/13/2006    Plan: Patient will undergo surgical management with posterior repair.  Pt to be referred to surgical scheduler here I will ask Dr Moshe Cipro to review pt's medications, from a presurgical standpoint. cuurently I see NO evidence of any blood thinners , and pts diabetes and hypertension are being addressed.   .mec 04/26/2015 11:07 AM    By signing my name below, I, [name], attest that this documentation has been prepared under the direction and in the presence of Jonnie Kind, MD. Electronically Signed: [name], ED Scribe. 04/26/2015. 11:11 AM. I personally performed the services described in this documentation, which was SCRIBED in my presence. The recorded information has been reviewed and considered accurate. It has been edited as necessary during review. Jonnie Kind, MD

## 2015-04-27 DIAGNOSIS — N816 Rectocele: Secondary | ICD-10-CM | POA: Insufficient documentation

## 2015-04-27 DIAGNOSIS — H1032 Unspecified acute conjunctivitis, left eye: Secondary | ICD-10-CM | POA: Insufficient documentation

## 2015-04-27 DIAGNOSIS — K1329 Other disturbances of oral epithelium, including tongue: Secondary | ICD-10-CM | POA: Diagnosis not present

## 2015-04-27 DIAGNOSIS — K1321 Leukoplakia of oral mucosa, including tongue: Secondary | ICD-10-CM | POA: Diagnosis not present

## 2015-04-27 DIAGNOSIS — R159 Full incontinence of feces: Secondary | ICD-10-CM | POA: Insufficient documentation

## 2015-04-27 NOTE — Assessment & Plan Note (Signed)
Out patient therapy arranged while in hospital for improved strengthening

## 2015-04-27 NOTE — Assessment & Plan Note (Addendum)
Patient education provided and topical antibiotic for short term use

## 2015-04-27 NOTE — Assessment & Plan Note (Signed)
Follow up with endo, question of erratic blood sugar levels as on steroid currently, she will follow through with this

## 2015-04-27 NOTE — Assessment & Plan Note (Signed)
Uncontrolled and problematic, start medication and pt ed provided

## 2015-04-27 NOTE — Assessment & Plan Note (Signed)
Controlled, stay on current medication, check kidney function today

## 2015-04-29 DIAGNOSIS — I10 Essential (primary) hypertension: Secondary | ICD-10-CM | POA: Diagnosis not present

## 2015-04-29 DIAGNOSIS — Z794 Long term (current) use of insulin: Secondary | ICD-10-CM | POA: Diagnosis not present

## 2015-04-29 DIAGNOSIS — F17210 Nicotine dependence, cigarettes, uncomplicated: Secondary | ICD-10-CM | POA: Diagnosis not present

## 2015-04-29 DIAGNOSIS — I69354 Hemiplegia and hemiparesis following cerebral infarction affecting left non-dominant side: Secondary | ICD-10-CM | POA: Diagnosis not present

## 2015-04-29 DIAGNOSIS — R260 Ataxic gait: Secondary | ICD-10-CM | POA: Diagnosis not present

## 2015-04-29 DIAGNOSIS — J440 Chronic obstructive pulmonary disease with acute lower respiratory infection: Secondary | ICD-10-CM | POA: Diagnosis not present

## 2015-04-29 DIAGNOSIS — F250 Schizoaffective disorder, bipolar type: Secondary | ICD-10-CM | POA: Diagnosis not present

## 2015-04-29 DIAGNOSIS — M5010 Cervical disc disorder with radiculopathy, unspecified cervical region: Secondary | ICD-10-CM | POA: Diagnosis not present

## 2015-04-29 DIAGNOSIS — E119 Type 2 diabetes mellitus without complications: Secondary | ICD-10-CM | POA: Diagnosis not present

## 2015-05-01 ENCOUNTER — Encounter (HOSPITAL_COMMUNITY): Payer: Self-pay | Admitting: *Deleted

## 2015-05-01 ENCOUNTER — Emergency Department (HOSPITAL_COMMUNITY)
Admission: EM | Admit: 2015-05-01 | Discharge: 2015-05-01 | Disposition: A | Discharge status: Home / Self Care | Payer: Commercial Managed Care - HMO | Attending: Emergency Medicine | Admitting: Emergency Medicine

## 2015-05-01 ENCOUNTER — Emergency Department (HOSPITAL_COMMUNITY): Payer: Commercial Managed Care - HMO

## 2015-05-01 DIAGNOSIS — R4182 Altered mental status, unspecified: Secondary | ICD-10-CM | POA: Diagnosis not present

## 2015-05-01 DIAGNOSIS — F17210 Nicotine dependence, cigarettes, uncomplicated: Secondary | ICD-10-CM | POA: Insufficient documentation

## 2015-05-01 DIAGNOSIS — Y998 Other external cause status: Secondary | ICD-10-CM | POA: Insufficient documentation

## 2015-05-01 DIAGNOSIS — M549 Dorsalgia, unspecified: Secondary | ICD-10-CM

## 2015-05-01 DIAGNOSIS — E119 Type 2 diabetes mellitus without complications: Secondary | ICD-10-CM | POA: Diagnosis not present

## 2015-05-01 DIAGNOSIS — I10 Essential (primary) hypertension: Secondary | ICD-10-CM | POA: Insufficient documentation

## 2015-05-01 DIAGNOSIS — E039 Hypothyroidism, unspecified: Secondary | ICD-10-CM | POA: Diagnosis not present

## 2015-05-01 DIAGNOSIS — J449 Chronic obstructive pulmonary disease, unspecified: Secondary | ICD-10-CM | POA: Insufficient documentation

## 2015-05-01 DIAGNOSIS — R404 Transient alteration of awareness: Secondary | ICD-10-CM | POA: Diagnosis not present

## 2015-05-01 DIAGNOSIS — S0990XA Unspecified injury of head, initial encounter: Secondary | ICD-10-CM | POA: Diagnosis not present

## 2015-05-01 DIAGNOSIS — F1310 Sedative, hypnotic or anxiolytic abuse, uncomplicated: Secondary | ICD-10-CM | POA: Diagnosis not present

## 2015-05-01 DIAGNOSIS — Y929 Unspecified place or not applicable: Secondary | ICD-10-CM | POA: Diagnosis not present

## 2015-05-01 DIAGNOSIS — W19XXXA Unspecified fall, initial encounter: Secondary | ICD-10-CM

## 2015-05-01 DIAGNOSIS — Y939 Activity, unspecified: Secondary | ICD-10-CM | POA: Insufficient documentation

## 2015-05-01 DIAGNOSIS — R42 Dizziness and giddiness: Secondary | ICD-10-CM | POA: Diagnosis not present

## 2015-05-01 DIAGNOSIS — Z79899 Other long term (current) drug therapy: Secondary | ICD-10-CM

## 2015-05-01 DIAGNOSIS — F319 Bipolar disorder, unspecified: Secondary | ICD-10-CM | POA: Insufficient documentation

## 2015-05-01 DIAGNOSIS — R0902 Hypoxemia: Secondary | ICD-10-CM | POA: Diagnosis not present

## 2015-05-01 DIAGNOSIS — W228XXA Striking against or struck by other objects, initial encounter: Secondary | ICD-10-CM | POA: Diagnosis not present

## 2015-05-01 DIAGNOSIS — S3992XA Unspecified injury of lower back, initial encounter: Secondary | ICD-10-CM | POA: Diagnosis not present

## 2015-05-01 DIAGNOSIS — R51 Headache: Secondary | ICD-10-CM | POA: Diagnosis not present

## 2015-05-01 DIAGNOSIS — S199XXA Unspecified injury of neck, initial encounter: Secondary | ICD-10-CM | POA: Diagnosis not present

## 2015-05-01 LAB — COMPREHENSIVE METABOLIC PANEL
ALK PHOS: 109 U/L (ref 38–126)
ALT: 24 U/L (ref 14–54)
ANION GAP: 9 (ref 5–15)
AST: 27 U/L (ref 15–41)
Albumin: 3.6 g/dL (ref 3.5–5.0)
BUN: 27 mg/dL — ABNORMAL HIGH (ref 6–20)
CALCIUM: 8.7 mg/dL — AB (ref 8.9–10.3)
CO2: 25 mmol/L (ref 22–32)
CREATININE: 1.02 mg/dL — AB (ref 0.44–1.00)
Chloride: 109 mmol/L (ref 101–111)
GFR, EST NON AFRICAN AMERICAN: 57 mL/min — AB (ref >60)
Glucose, Bld: 115 mg/dL — ABNORMAL HIGH (ref 65–99)
Potassium: 4.6 mmol/L (ref 3.5–5.1)
Sodium: 143 mmol/L (ref 135–145)
TOTAL PROTEIN: 6.7 g/dL (ref 6.5–8.1)
Total Bilirubin: 0.4 mg/dL (ref 0.3–1.2)

## 2015-05-01 LAB — RAPID URINE DRUG SCREEN, HOSP PERFORMED
Amphetamines: NOT DETECTED
BARBITURATES: NOT DETECTED
Benzodiazepines: POSITIVE — AB
Cocaine: NOT DETECTED
Opiates: POSITIVE — AB
TETRAHYDROCANNABINOL: NOT DETECTED

## 2015-05-01 LAB — CBC WITH DIFFERENTIAL/PLATELET
Basophils Absolute: 0 10*3/uL (ref 0.0–0.1)
Basophils Relative: 0 %
EOS ABS: 0.1 10*3/uL (ref 0.0–0.7)
EOS PCT: 2 %
HCT: 33 % — ABNORMAL LOW (ref 36.0–46.0)
HEMOGLOBIN: 10.1 g/dL — AB (ref 12.0–15.0)
LYMPHS ABS: 2.7 10*3/uL (ref 0.7–4.0)
LYMPHS PCT: 39 %
MCH: 23.3 pg — AB (ref 26.0–34.0)
MCHC: 30.6 g/dL (ref 30.0–36.0)
MCV: 76 fL — AB (ref 78.0–100.0)
MONOS PCT: 8 %
Monocytes Absolute: 0.6 10*3/uL (ref 0.1–1.0)
NEUTROS PCT: 51 %
Neutro Abs: 3.6 10*3/uL (ref 1.7–7.7)
Platelets: 212 10*3/uL (ref 150–400)
RBC: 4.34 MIL/uL (ref 3.87–5.11)
RDW: 14 % (ref 11.5–15.5)
WBC: 7 10*3/uL (ref 4.0–10.5)

## 2015-05-01 LAB — URINALYSIS, ROUTINE W REFLEX MICROSCOPIC
Bilirubin Urine: NEGATIVE
GLUCOSE, UA: NEGATIVE mg/dL
Hgb urine dipstick: NEGATIVE
KETONES UR: NEGATIVE mg/dL
NITRITE: NEGATIVE
PROTEIN: NEGATIVE mg/dL
Specific Gravity, Urine: 1.02 (ref 1.005–1.030)
pH: 5.5 (ref 5.0–8.0)

## 2015-05-01 LAB — URINE MICROSCOPIC-ADD ON

## 2015-05-01 LAB — ACETAMINOPHEN LEVEL

## 2015-05-01 LAB — ETHANOL

## 2015-05-01 LAB — AMMONIA: Ammonia: 33 umol/L (ref 9–35)

## 2015-05-01 LAB — CBG MONITORING, ED: GLUCOSE-CAPILLARY: 117 mg/dL — AB (ref 65–99)

## 2015-05-01 LAB — SALICYLATE LEVEL

## 2015-05-01 MED ORDER — SODIUM CHLORIDE 0.9 % IV SOLN
INTRAVENOUS | Status: DC
  Administered 2015-05-01: 03:00:00 via INTRAVENOUS

## 2015-05-01 NOTE — ED Provider Notes (Addendum)
TIME SEEN: 2:20 AM  CHIEF COMPLAINT:  fall  HPI: Pt is a 65 y.o.  Female with history of previous CVA, hypertension , diabetes, COPD who presents to the emergency department with her niece after she fell tonight. Patient reports she was getting up to get a ball of water when her "legs went out from underneath  Me". States she has felt off balance for 2 days. States she did hit the left side of her head. She is not on anticoagulation. No loss of consciousness. Patient is very drowsy and states she has taken her nighttime sleep medication. There are reports that this is her baseline. They report that she has narcolepsy and is on multiple sedating medications. They state that she has an aide who stays with her the daytime. Patient denies any fevers, cough, vomiting or diarrhea. No chest pain or shortness of breath. Is complaining of lower back pain which meets reports is chronic. No new numbness or focal weakness.  ROS: See HPI Constitutional: no fever  Eyes: no drainage  ENT: no runny nose   Cardiovascular:  no chest pain  Resp: no SOB  GI: no vomiting GU: no dysuria Integumentary: no rash  Allergy: no hives  Musculoskeletal: no leg swelling  Neurological: no slurred speech ROS otherwise negative  PAST MEDICAL HISTORY/PAST SURGICAL HISTORY:  Past Medical History  Diagnosis Date  . Bipolar disorder ([place])   . CVA (cerebral infarction)   . Pancreatitis 2006    due to Depakote with normal EUS   . Osteoporosis   . Chronic back pain   . Trigger finger   . Anxiety disorder   . Hypertension   . Migraines     chronic headaches  . Diverticulosis     TCS 9/08 by Dr. Delfin Edis for diarrhea . Bx for micro scopic colitis negative.   . Schatzki's ring     non critical / EGD with ED 8/2011with RMR  . S/P colonoscopy [id number], 2011    left-sided diverticula, hx of simple adenomas . 2011, random bx negative for microscopic colitis  . Glaucoma   . Allergic rhinitis   . Hypothyroidism      thyroid goiter  . Anemia   . Blood transfusion   . GERD (gastroesophageal reflux disease)   . Stroke [facility])     left sided weakness  . Seizures ([place])     unknown etiology-on meds-last seizure was 3 yerars ago  . Anxiety   . Depression   . Metabolic encephalopathy 0/37/0964  . Sleep apnea     on CPAP  . Arthritis   . Gum symptoms     infection on antibiotic  . Chronic neck pain   . Mononeuritis lower limb   . Frequent falls   . Diabetes mellitus     Type II  . Complication of anesthesia     "hard to put sleep"  . Cervical disc disorder with radiculopathy of cervical region 10/31/2012  . COPD (chronic obstructive pulmonary disease) with chronic bronchitis ([place]) 09/16/2013    Office Spirometry 10/30/2013-submaximal effort based on appearance of loop and curve. Numbers would fit with severe restriction but her physiologic capability may be better than this. FVC 0.91/44%, and 10.74/45%, FEV1/FVC 0.81, FEF 25-75% 1.43/69%    . Carpal tunnel syndrome of right wrist 05/23/2011  . Hemiplegia affecting non-dominant side, post-stroke ([place]) 08/02/2011  . Pulmonary hypertension ([place]) 02/22/2015    45 mmHg   . LUE weakness 02/21/15    Left hand drop  MEDICATIONS:  Prior to Admission medications   Medication Sig Start Date End Date Taking? Authorizing Provider  ACCU-CHEK FASTCLIX LANCETS MISC Use to test blood sugar 4 times daily. Dx: E10.65 12/03/14   Philemon Kingdom, MD  albuterol (PROVENTIL HFA;VENTOLIN HFA) 108 (90 BASE) MCG/ACT inhaler Inhale 1 puff into the lungs 3 (three) times daily. 02/22/15   Rexene Alberts, MD  Alcohol Swabs (B-D SINGLE USE SWABS REGULAR) PADS Use for injections and glucose testing 8 times daily. Dx: E10.65 12/03/14   Philemon Kingdom, MD  amLODipine (NORVASC) 5 MG tablet Take 1 tablet (5 mg total) by mouth daily. 12/30/14   [name], MD  aspirin EC 81 MG tablet Take 81 mg by mouth daily.    Historical Provider, MD  BD PEN NEEDLE NANO U/F 32G X 4 MM MISC USE  FOUR TIMES DAILY 04/20/15   Philemon Kingdom, MD  Blood Glucose Calibration (ACCU-CHEK SMARTVIEW CONTROL) LIQD 1 each by Other route as needed. 12/03/14   Philemon Kingdom, MD  Blood Glucose Monitoring Suppl (ACCU-CHEK NANO SMARTVIEW) W/DEVICE KIT 1 each by Does not apply route daily. Dx: E10.65 12/03/14   Philemon Kingdom, MD  Cholecalciferol (D 5000) 5000 UNITS capsule Take 5,000 Units by mouth daily.    Historical Provider, MD  clobetasol cream (TEMOVATE) 1.69 % Apply 1 application topically daily.    Historical Provider, MD  cloNIDine (CATAPRES) 0.3 MG tablet TAKE 1 TABLET BY MOUTH EVERY EIGHT HOURS. ONCE AT 8AM, 4PM, AND 12 MIDNIGHT. 02/01/15   [name], MD  diclofenac sodium (VOLTAREN) 1 % GEL Apply 2 g topically daily as needed (Pain). 11/20/14   [name], MD  dicyclomine (BENTYL) 10 MG capsule TAKE 1 CAPSULE BY M OUTH THREE TIMES DAILY BEFORE MEALS. 03/16/15   [name], MD  gentamicin (GARAMYCIN) 0.3 % ophthalmic solution Place 1 drop into the left eye 3 (three) times daily. 03/02/15   [name], MD  glucose blood (ACCU-CHEK SMARTVIEW) test strip Use to test blood sugar 4 times daily as instructed. Dx: E10.65 12/03/14   Philemon Kingdom, MD  hydroxychloroquine (PLAQUENIL) 200 MG tablet Take 200 mg by mouth daily.    Historical Provider, MD  insulin aspart (NOVOLOG FLEXPEN) 100 UNIT/ML FlexPen Inject 14-18 Units into the skin 3 (three) times daily with meals. 03/25/15   Philemon Kingdom, MD  Insulin Glargine (TOUJEO SOLOSTAR) 300 UNIT/ML SOPN Inject 35 Units into the skin at bedtime. 03/25/15   Philemon Kingdom, MD  lamoTRIgine (LAMICTAL) 100 MG tablet TAKE 1 TABLET BY MOUTH TWICE DAILY. 03/16/15   Pieter Partridge, DO  levothyroxine (SYNTHROID, LEVOTHROID) 50 MCG tablet TAKE 1 TABLET BY MOUTH ONCE DAILY AND 1/2 TABLET ON SUNDAYS. Patient taking differently: Take 25-50 mcg by mouth daily. TAKE 1 TABLET BY MOUTH ONCE DAILY AND 1/2 TABLET ON SUNDAYS. 02/15/15   Philemon Kingdom, MD  LORazepam (ATIVAN) 0.5 MG tablet Take 1 tablet (0.5 mg total) by mouth 3 (three) times daily. 02/02/15   Cloria Spring, MD  lubiprostone (AMITIZA) 24 MCG capsule Take 1 capsule (24 mcg total) by mouth 2 (two) times daily with a meal. Patient not taking: Reported on 04/21/2015 04/12/15   Jonnie Kind, MD  methocarbamol (ROBAXIN) 500 MG tablet Take 500 mg by mouth daily. 12/21/14   Historical Provider, MD  metoprolol (LOPRESSOR) 50 MG tablet TAKE 1 TABLET BY MOUTH TWICE DAILY. 09/18/14   Pixie Casino, MD  montelukast (SINGULAIR) 10 MG tablet Take 1 tablet (10 mg  total) by mouth daily. 02/15/15   [name], MD  Multiple Vitamins-Minerals (ONE-A-DAY 50 PLUS PO) Take 1 tablet by mouth daily.    Historical Provider, MD  Nystatin [facility]) 100000 UNIT/GM POWD APPLY TO AFFECTED AREA 4 TIMES DAILY. 10/20/14   [name], MD  oxybutynin (DITROPAN) 5 MG tablet One tablet once daily Patient not taking: Reported on 04/21/2015 03/02/15   [name], MD  polyethylene glycol powder (MIRALAX) powder Take 17 g by mouth daily. To prevent constipation 04/12/15   Jonnie Kind, MD  pregabalin (LYRICA) 75 MG capsule Take 75 mg by mouth 2 (two) times daily.  10/26/11   Carole Civil, MD  promethazine (PHENERGAN) 12.5 MG tablet Take 1 tablet (12.5 mg total) by mouth every 6 (six) hours as needed for nausea or vomiting. Patient not taking: Reported on 04/21/2015 08/20/14   Mahala Menghini, PA-C  promethazine-dextromethorphan (PROMETHAZINE-DM) 6.25-15 MG/5ML syrup One teaspoon at bedtime as needed , for excess cough Patient not taking: Reported on 04/21/2015 03/17/15   [name], MD  RABEprazole (ACIPHEX) 20 MG tablet TAKE 1 TABLET BY MOUTH TWICE DAILY. 01/20/15   Orvil Feil, NP  RESTASIS 0.05 % ophthalmic emulsion Place 1 drop into both eyes 2 (two) times daily.  02/04/14   Historical Provider, MD  risperiDONE (RISPERDAL) 0.5 MG tablet Take 1 tablet (0.5 mg total) by mouth at  bedtime. 02/02/15   Cloria Spring, MD  rosuvastatin (CRESTOR) 5 MG tablet TAKE 1 TABLET BY MOUTH AT BEDTIME. 12/22/14   [name], MD  sertraline (ZOLOFT) 100 MG tablet Take 2 tablets (200 mg total) by mouth daily. 02/02/15   Cloria Spring, MD  traZODone (DESYREL) 150 MG tablet Take 2 tablets (300 mg total) by mouth at bedtime. 02/02/15   Cloria Spring, MD  vitamin E 400 UNIT capsule Take 400 Units by mouth daily.    Historical Provider, MD  ZETIA 10 MG tablet TAKE ONE TABLET BY MOUTH ONCE DAILY. 12/22/14   [name], MD    ALLERGIES:  Allergies  Allergen Reactions  . Cephalexin Hives  . Iron Nausea And Vomiting  . Milk-Related Compounds Other (See Comments)    Doesn't agree with stomach.   . Penicillins Hives    Has patient had a PCN reaction causing immediate rash, facial/tongue/throat swelling, SOB or lightheadedness with hypotension: Yes Has patient had a PCN reaction causing severe rash involving mucus membranes or skin necrosis: No Has patient had a PCN reaction that required hospitalization No Has patient had a PCN reaction occurring within the last 10 years: No If all of the above answers are "NO", then may proceed with Cephalosporin use.   [name] Phenazopyridine Hcl Hives          SOCIAL HISTORY:  Social History  Substance Use Topics  . Smoking status: Current Some Day Smoker -- 0.50 packs/day for 7 years    Types: Cigarettes  . Smokeless tobacco: Never Used     Comment: quit for 5 weeks now   . Alcohol Use: No     Comment:      FAMILY HISTORY: Family History  Problem Relation Age of Onset  . Heart attack Mother     HTN  . Pneumonia Father   . Kidney failure Father   . Diabetes Father   . Pancreatic cancer Sister   . Diabetes Brother   . Hypertension Brother   . Diabetes Brother   . Colon cancer  Neg Hx   . Anesthesia problems Neg Hx   . Hypotension Neg Hx   . Malignant hyperthermia Neg Hx   . Pseudochol deficiency Neg Hx   . Stroke  Maternal Grandmother   . Heart attack Maternal Grandfather   . Cancer Sister     breast   . Alcohol abuse Maternal Uncle   . Hypertension Brother   . Hypertension Son   . Sleep apnea Son     EXAM: BP 113/51 mmHg  Pulse 63  Temp(Src) 98.3 F (36.8 C) (Oral)  Resp 14  Ht _0  (1.549 m)  Wt 155 lb (70.308 kg)  BMI 29.30 kg/m2  SpO2 100% CONSTITUTIONAL: Alert and oriented and responds appropriately to questions.   chronically ill-appearing, drowsy but arousable to voice HEAD: Normocephalic; atraumatic EYES: Conjunctivae clear, PERRL, EOMI ENT: normal nose; no rhinorrhea; moist mucous membranes; pharynx without lesions noted; no dental injury; no septal hematoma NECK: Supple, no meningismus, no LAD; no midline spinal tenderness, step-off or deformity CARD: RRR; S1 and S2 appreciated; no murmurs, no clicks, no rubs, no gallops RESP: Normal chest excursion without splinting or tachypnea; breath sounds clear and equal bilaterally; no wheezes, no rhonchi, no rales; no hypoxia or respiratory distress CHEST:  chest wall stable, no crepitus or ecchymosis or deformity, nontender to palpation ABD/GI: Normal bowel sounds; non-distended; soft, non-tender, no rebound, no guarding PELVIS:  stable, nontender to palpation BACK:  The back appears normal and is  Tender over the lumbar spine without step-off or deformity, there is no CVA tenderness; no midline spinal tenderness, step-off or deformity  Of the thoracic spine EXT: Normal ROM in all joints; non-tender to palpation; no edema; normal capillary refill; no cyanosis, no bony tenderness or bony deformity of patient's extremities, no joint effusion, no ecchymosis or lacerations    SKIN: Normal color for age and race; warm NEURO: Moves all extremities equally, sensation to light touch intact diffusely, cranial nerves II through XII intact PSYCH: The patient's mood and manner are appropriate. Grooming and personal hygiene are appropriate.  MEDICAL  DECISION MAKING:  Patient here after a fall. Reports feeling "off balance". She is very drowsy on exam but arousable to voice. No obvious other focal neurologic deficits. No obvious sign of trauma on exam.  Her niece who is at the bedside reports that this is the patient's baseline and that she is on multiple sedative medications. Patient reports she did take her sleeping medication prior to her fall. We'll obtain CT imaging of her head and cervical spine, x-ray of her lumbar spine. Given she does appear very drowsy will obtain labs, urine and chest x-ray to evaluate for any organic cause for her drowsiness. EKG shows no acute abnormality. CBG is normal.  ED PROGRESS:  Patient's labs are unremarkable. Imaging shows no acute injury. Urine drug screen is positive for opiates and benzodiazepines and also appears per her medication list that she is on trazodone. Patient is still drowsy  And sleeping but is again still arousable by voice. We'll fluid challenge patient in angulate patient. If she is able to successfully perform this task patient's niece states she is ready to take her home. They have a PCP for follow-up. Niece reports that she can stay with the patient until patient's aide comes in the morning. Have had a lengthy discussion with niece and patient that I feel that this is polypharmacy. I recommend she follow-up with her primary care physician for further management. Discussed return precautions. Discussed the risk  of taking multiple sedative medications at the same time. Patient and niece verbalize understanding and are comfortable with this plan.    Date: 05/01/2015 2:35 AM  Rate: 62  Rhythm: normal sinus rhythm  QRS Axis: normal  Intervals: normal  ST/T Wave abnormalities: normal  Conduction Disutrbances: none  Narrative Interpretation: unremarkable      [place], DO 05/01/15 0457    5:15 AM  Patient able to ambulate with minimal assistance. She does use a cane at baseline. I  feel she is safe to be discharged.  [place], DO 05/01/15 [phone]

## 2015-05-01 NOTE — Discharge Instructions (Signed)
You're on multiple sedative medications that are likely contributing to you feeling weak, off balance and likely contributed to  your fall tonight. I recommend  you follow-up with your primary care physician beginning of next week to determine if there are any medications are safe for you to stop. I do not think that you should take your pain medication with your benzodiazepines and trazodone as this is likely causing you to be very sleepy.   Your imaging, labs and urine were otherwise unremarkable.    Head Injury, Adult You have a head injury. Headaches and throwing up (vomiting) are common after a head injury. It should be easy to wake up from sleeping. Sometimes you must stay in the hospital. Most problems happen within the first 24 hours. Side effects may occur up to 7-10 days after the injury.  WHAT ARE THE TYPES OF HEAD INJURIES? Head injuries can be as minor as a bump. Some head injuries can be more severe. More severe head injuries include:  A jarring injury to the brain (concussion).  A bruise of the brain (contusion). This mean there is bleeding in the brain that can cause swelling.  A cracked skull (skull fracture).  Bleeding in the brain that collects, clots, and forms a bump (hematoma). WHEN SHOULD I GET HELP RIGHT AWAY?   You are confused or sleepy.  You cannot be woken up.  You feel sick to your stomach (nauseous) or keep throwing up (vomiting).  Your dizziness or unsteadiness is getting worse.  You have very bad, lasting headaches that are not helped by medicine. Take medicines only as told by your doctor.  You cannot use your arms or legs like normal.  You cannot walk.  You notice changes in the black spots in the center of the colored part of your eye (pupil).  You have clear or bloody fluid coming from your nose or ears.  You have trouble seeing. During the next 24 hours after the injury, you must stay with someone who can watch you. This person should get  help right away (call 911 in the [place] if you start to shake and are not able to control it (have seizures), you pass out, or you are unable to wake up. HOW CAN I PREVENT A HEAD INJURY IN THE FUTURE?  Wear seat belts.  Wear a helmet while bike riding and playing sports like football.  Stay away from dangerous activities around the house. WHEN CAN I RETURN TO NORMAL ACTIVITIES AND ATHLETICS? See your doctor before doing these activities. You should not do normal activities or play contact sports until 1 week after the following symptoms have stopped:  Headache that does not go away.  Dizziness.  Poor attention.  Confusion.  Memory problems.  Sickness to your stomach or throwing up.  Tiredness.  Fussiness.  Bothered by bright lights or loud noises.  Anxiousness or depression.  Restless sleep. MAKE SURE YOU:   Understand these instructions.  Will watch your condition.  Will get help right away if you are not doing well or get worse.   This information is not intended to replace advice given to you by your health care provider. Make sure you discuss any questions you have with your health care provider.   Document Released: 02/10/2008 Document Revised: 03/20/2014 Document Reviewed: 11/04/2012 Elsevier Interactive Patient Education [employer].

## 2015-05-01 NOTE — ED Notes (Signed)
Ambulated patient from her room to the nurses station with a walker. No issues walking. Made doctor aware.

## 2015-05-01 NOTE — ED Notes (Addendum)
Pt states she has felt "off balance" for 2 days. Pt states she got up to get a bottle of water and the pt states her legs went out from under her and fell and hit on a stereo.Pt denies loc. Pt states she called 911 because she hit the left side of her head and had a nose bleed. Pt drowsy stating she has already taken her night time sleep medicine. Pt's niece states that her aunt is acting her normal self.

## 2015-05-03 ENCOUNTER — Encounter: Payer: Self-pay | Admitting: Acute Care

## 2015-05-03 ENCOUNTER — Ambulatory Visit (INDEPENDENT_AMBULATORY_CARE_PROVIDER_SITE_OTHER): Payer: Commercial Managed Care - HMO | Admitting: Acute Care

## 2015-05-03 ENCOUNTER — Ambulatory Visit: Payer: Self-pay | Admitting: Adult Health

## 2015-05-03 VITALS — BP 122/62 | HR 68 | Temp 98.3°F | Ht 61.0 in | Wt 159.0 lb

## 2015-05-03 DIAGNOSIS — J209 Acute bronchitis, unspecified: Secondary | ICD-10-CM | POA: Diagnosis not present

## 2015-05-03 DIAGNOSIS — G4733 Obstructive sleep apnea (adult) (pediatric): Secondary | ICD-10-CM | POA: Diagnosis not present

## 2015-05-03 MED ORDER — AZITHROMYCIN 250 MG PO TABS: ORAL_TABLET | ORAL | Status: DC

## 2015-05-03 MED ORDER — PREDNISONE 10 MG PO TABS: ORAL_TABLET | ORAL | Status: DC

## 2015-05-03 NOTE — Assessment & Plan Note (Addendum)
Suspected early stage bronchitis in this COPD patient who is complaining of shortness of breath and wheezing in addition to discolored mucus.  Plan:  We will treat you with a  Prednisone taper; 10 mg tablets: 4 tabs x 2 days,2 tabs x 2 days 1 tab x 2 days then stop. Please watch your blood sugars while on the prednisone. Call for blood sugars greater than 250. We will treat you with a z-pack for your bronchitis. This is an antibiotic. Take probiotics while on the antibiotic. These can be purchased over the counter. Mucinex twice daily. Please use nasal saline mist for your nasal dryness. Please quit smoking. It is the single most powerful action you can take to improve your health. Please make this patient an appointment with [name] for her regular follow up.  Please contact office for sooner follow up if symptoms do not improve or worsen or seek emergency care

## 2015-05-03 NOTE — Assessment & Plan Note (Signed)
Pt. States she is not using her CPAP. She complains of being very tired. She is very lethargic.  Plan: Reminded patient she needs to wear the CPAP to get the benefits of the treatment. Appointment with [name] to follow up regarding CPAP usage.She has not been seen here since 08/2014.

## 2015-05-03 NOTE — Progress Notes (Signed)
Subjective:    Patient ID: [name], female    DOB: April 17, 1950, 65 y.o.   MRN: [id number]  HPI 65 year old female patient, current smoker followed for obstructive sleep apnea with hypersomnia, COPD and bronchitis, R/O'd Lung nodule, complicated by hx resection of meningioma. Bipolar, GERD, DM.  Significant events/ Procedures:  EXAM: 05/01/15  CHEST 1 VIEW  COMPARISON: 02/21/2015  FINDINGS: Shallow inspiration. Mild cardiac enlargement. No significant vascular congestion or edema. No focal consolidation or airspace disease. No blunting of costophrenic angles. No pneumothorax. Surgical clips in the right upper quadrant.  IMPRESSION: Shallow inspiration. Cardiac enlargement. No evidence of active pulmonary disease   05/01/15 ED Visit for Fall: Determined to be polypharmacy in nature. CXR as above  05/18/15: Pending Surgery for Posterior repair ( Retrocele)  05/03/15: Acute Office Visit: Patient returns today for complaint of continued wheezing, sinus pressure, and nasal drainage. She states this is been going on for 3-4 weeks. She denies fever, chest pain, hemoptysis, or leg pain. She had a recent emergency room visit 05/01/2015 for a fall due to suspected polypharmacy. She is very lethargic in the office today. Chest x-ray on 05/01/15 was clear.  Current outpatient prescriptions:  .  ACCU-CHEK FASTCLIX LANCETS MISC, Use to test blood sugar 4 times daily. Dx: E10.65, Disp: 408 each, Rfl: 1 .  albuterol (PROVENTIL HFA;VENTOLIN HFA) 108 (90 BASE) MCG/ACT inhaler, Inhale 1 puff into the lungs 3 (three) times daily., Disp: 1 Inhaler, Rfl: 2 .  Alcohol Swabs (B-D SINGLE USE SWABS REGULAR) PADS, Use for injections and glucose testing 8 times daily. Dx: E10.65, Disp: 900 each, Rfl: 1 .  amLODipine (NORVASC) 5 MG tablet, Take 1 tablet (5 mg total) by mouth daily., Disp: 30 tablet, Rfl: 3 .  aspirin EC 81 MG tablet, Take 81 mg by mouth daily., Disp: , Rfl:  .  BD PEN NEEDLE NANO U/F  32G X 4 MM MISC, USE FOUR TIMES DAILY, Disp: 360 each, Rfl: 3 .  Blood Glucose Calibration (ACCU-CHEK SMARTVIEW CONTROL) LIQD, 1 each by Other route as needed., Disp: 1 each, Rfl: 2 .  Blood Glucose Monitoring Suppl (ACCU-CHEK NANO SMARTVIEW) W/DEVICE KIT, 1 each by Does not apply route daily. Dx: E10.65, Disp: 1 kit, Rfl: 0 .  Cholecalciferol (D 5000) 5000 UNITS capsule, Take 5,000 Units by mouth daily., Disp: , Rfl:  .  clobetasol cream (TEMOVATE) 5.27 %, Apply 1 application topically daily., Disp: , Rfl:  .  cloNIDine (CATAPRES) 0.3 MG tablet, TAKE 1 TABLET BY MOUTH EVERY EIGHT HOURS. ONCE AT 8AM, 4PM, AND 12 MIDNIGHT., Disp: 90 tablet, Rfl: 3 .  diclofenac sodium (VOLTAREN) 1 % GEL, Apply 2 g topically daily as needed (Pain)., Disp: 100 g, Rfl: 0 .  dicyclomine (BENTYL) 10 MG capsule, TAKE 1 CAPSULE BY M OUTH THREE TIMES DAILY BEFORE MEALS., Disp: 90 capsule, Rfl: 3 .  gentamicin (GARAMYCIN) 0.3 % ophthalmic solution, Place 1 drop into the left eye 3 (three) times daily., Disp: 5 mL, Rfl: 0 .  hydroxychloroquine (PLAQUENIL) 200 MG tablet, Take 200 mg by mouth daily., Disp: , Rfl:  .  insulin aspart (NOVOLOG FLEXPEN) 100 UNIT/ML FlexPen, Inject 14-18 Units into the skin 3 (three) times daily with meals., Disp: 30 mL, Rfl: 1 .  Insulin Glargine (TOUJEO SOLOSTAR) 300 UNIT/ML SOPN, Inject 35 Units into the skin at bedtime., Disp: 6 pen, Rfl: 1 .  lamoTRIgine (LAMICTAL) 100 MG tablet, TAKE 1 TABLET BY MOUTH TWICE DAILY., Disp: 60 tablet, Rfl:  5 .  levothyroxine (SYNTHROID, LEVOTHROID) 50 MCG tablet, TAKE 1 TABLET BY MOUTH ONCE DAILY AND 1/2 TABLET ON SUNDAYS. (Patient taking differently: Take 25-50 mcg by mouth daily. TAKE 1 TABLET BY MOUTH ONCE DAILY AND 1/2 TABLET ON SUNDAYS.), Disp: 30 tablet, Rfl: 2 .  LORazepam (ATIVAN) 0.5 MG tablet, Take 1 tablet (0.5 mg total) by mouth 3 (three) times daily., Disp: 90 tablet, Rfl: 2 .  lubiprostone (AMITIZA) 24 MCG capsule, Take 1 capsule (24 mcg total) by  mouth 2 (two) times daily with a meal., Disp: 60 capsule, Rfl: 2 .  methocarbamol (ROBAXIN) 500 MG tablet, Take 500 mg by mouth daily., Disp: , Rfl:  .  metoprolol (LOPRESSOR) 50 MG tablet, TAKE 1 TABLET BY MOUTH TWICE DAILY., Disp: 60 tablet, Rfl: 6 .  montelukast (SINGULAIR) 10 MG tablet, Take 1 tablet (10 mg total) by mouth daily., Disp: 30 tablet, Rfl: 2 .  Multiple Vitamins-Minerals (ONE-A-DAY 50 PLUS PO), Take 1 tablet by mouth daily., Disp: , Rfl:  .  Nystatin (NYAMYC) 100000 UNIT/GM POWD, APPLY TO AFFECTED AREA 4 TIMES DAILY., Disp: 30 g, Rfl: 2 .  oxybutynin (DITROPAN) 5 MG tablet, One tablet once daily, Disp: 30 tablet, Rfl: 3 .  polyethylene glycol powder (MIRALAX) powder, Take 17 g by mouth daily. To prevent constipation, Disp: 255 g, Rfl: prn .  pregabalin (LYRICA) 75 MG capsule, Take 75 mg by mouth 2 (two) times daily. , Disp: , Rfl:  .  promethazine (PHENERGAN) 12.5 MG tablet, Take 1 tablet (12.5 mg total) by mouth every 6 (six) hours as needed for nausea or vomiting., Disp: 30 tablet, Rfl: 0 .  promethazine-dextromethorphan (PROMETHAZINE-DM) 6.25-15 MG/5ML syrup, One teaspoon at bedtime as needed , for excess cough, Disp: 180 mL, Rfl: 0 .  RABEprazole (ACIPHEX) 20 MG tablet, TAKE 1 TABLET BY MOUTH TWICE DAILY., Disp: 60 tablet, Rfl: 3 .  RESTASIS 0.05 % ophthalmic emulsion, Place 1 drop into both eyes 2 (two) times daily. , Disp: , Rfl:  .  risperiDONE (RISPERDAL) 0.5 MG tablet, Take 1 tablet (0.5 mg total) by mouth at bedtime., Disp: 30 tablet, Rfl: 2 .  rosuvastatin (CRESTOR) 5 MG tablet, TAKE 1 TABLET BY MOUTH AT BEDTIME., Disp: 30 tablet, Rfl: 4 .  sertraline (ZOLOFT) 100 MG tablet, Take 2 tablets (200 mg total) by mouth daily., Disp: 60 tablet, Rfl: 2 .  traZODone (DESYREL) 150 MG tablet, Take 2 tablets (300 mg total) by mouth at bedtime., Disp: 60 tablet, Rfl: 2 .  vitamin E 400 UNIT capsule, Take 400 Units by mouth daily., Disp: , Rfl:  .  ZETIA 10 MG tablet, TAKE ONE TABLET  BY MOUTH ONCE DAILY., Disp: 30 tablet, Rfl: 4 .  azithromycin (ZITHROMAX) 250 MG tablet, Take 2 tablets by mouth today, then take 1 tablet daily until gone, Disp: 6 tablet, Rfl: 0 .  glucose blood (ACCU-CHEK SMARTVIEW) test strip, Use to test blood sugar 4 times daily as instructed. Dx: E10.65, Disp: 375 each, Rfl: 1 .  predniSONE (DELTASONE) 10 MG tablet, Take 4 tabs x 2 days, 2 tabs x 2 days 1 tab x 2 days then stop., Disp: 14 tablet, Rfl: 0   Past Medical History  Diagnosis Date  . Bipolar disorder ([place])   . CVA (cerebral infarction)   . Pancreatitis 2006    due to Depakote with normal EUS   . Osteoporosis   . Chronic back pain   . Trigger finger   . Anxiety disorder   .  Hypertension   . Migraines     chronic headaches  . Diverticulosis     TCS 9/08 by Dr. Delfin Edis for diarrhea . Bx for micro scopic colitis negative.   . Schatzki's ring     non critical / EGD with ED 8/2011with RMR  . S/P colonoscopy [id number],9604, 2011    left-sided diverticula, hx of simple adenomas . 2011, random bx negative for microscopic colitis  . Glaucoma   . Allergic rhinitis   . Hypothyroidism     thyroid goiter  . Anemia   . Blood transfusion   . GERD (gastroesophageal reflux disease)   . Stroke [facility])     left sided weakness  . Seizures ([place])     unknown etiology-on meds-last seizure was 3 yerars ago  . Anxiety   . Depression   . Metabolic encephalopathy 5/40/9811  . Sleep apnea     on CPAP  . Arthritis   . Gum symptoms     infection on antibiotic  . Chronic neck pain   . Mononeuritis lower limb   . Frequent falls   . Diabetes mellitus     Type II  . Complication of anesthesia     "hard to put sleep"  . Cervical disc disorder with radiculopathy of cervical region 10/31/2012  . COPD (chronic obstructive pulmonary disease) with chronic bronchitis ([place]) 09/16/2013    Office Spirometry 10/30/2013-submaximal effort based on appearance of loop and curve. Numbers would fit with severe  restriction but her physiologic capability may be better than this. FVC 0.91/44%, and 10.74/45%, FEV1/FVC 0.81, FEF 25-75% 1.43/69%    . Carpal tunnel syndrome of right wrist 05/23/2011  . Hemiplegia affecting non-dominant side, post-stroke ([place]) 08/02/2011  . Pulmonary hypertension ([place]) 02/22/2015    45 mmHg   . LUE weakness 02/21/15    Left hand drop    Allergies  Allergen Reactions  . Cephalexin Hives  . Iron Nausea And Vomiting  . Milk-Related Compounds Other (See Comments)    Doesn't agree with stomach.   . Penicillins Hives    Has patient had a PCN reaction causing immediate rash, facial/tongue/throat swelling, SOB or lightheadedness with hypotension: Yes Has patient had a PCN reaction causing severe rash involving mucus membranes or skin necrosis: No Has patient had a PCN reaction that required hospitalization No Has patient had a PCN reaction occurring within the last 10 years: No If all of the above answers are "NO", then may proceed with Cephalosporin use.   [name] Phenazopyridine Hcl Hives          Review of Systems Constitutional:   No  weight loss, night sweats,  Fevers, chills, fatigue, or  lassitude.  HEENT:   No headaches,  Difficulty swallowing,  Tooth/dental problems, or  Sore throat,                No sneezing, itching, ear ache,+nasal congestion, post nasal drip,   CV:  No chest pain,  Orthopnea, PND, swelling in lower extremities, anasarca, dizziness, palpitations, syncope.   GI  No heartburn, indigestion, abdominal pain, nausea, vomiting, diarrhea, change in bowel habits, loss of appetite, bloody stools.   Resp: + shortness of breath with exertion or at rest.  + excess mucus, no productive cough,  No non-productive cough,  No coughing up of blood.  + change in color of mucus.  No wheezing.  No chest wall deformity  Skin: no rash or lesions.  GU: no dysuria, change in color of urine, no urgency  or frequency.  No flank pain, no hematuria   MS:  No joint pain or  swelling.  No decreased range of motion.  No back pain.  Psych:  No change in mood or affect. No depression or anxiety.  No memory loss.        Objective:   Physical Exam  BP 122/62 mmHg  Pulse 68  Temp(Src) 98.3 F (36.8 C) (Oral)  Ht 5' 1"  (1.549 m)  Wt 159 lb (72.122 kg)  BMI 30.06 kg/m2  SpO2 98%  Physical Exam:  General- No distress,  A&Ox3, very lethargic, using a cane ENT: No sinus tenderness, TM clear,edematous nasal mucosa, no oral exudate,no post nasal drip, no LAN Cardiac: S1, S2, regular rate and rhythm, no murmur Chest: + wheeze/ no rales/ dullness; no accessory muscle use, no nasal flaring, no sternal retractions Abd.: Soft Non-tender Ext: No clubbing cyanosis, edema Neuro:  normal strength Skin: No rashes, warm and dry Psych: normal mood and behavior     Assessment & Plan:

## 2015-05-03 NOTE — Patient Instructions (Addendum)
Its nice to meet you today. I am sorry you are not feeling well today. We will treat you with a  Prednisone taper; 10 mg tablets: 4 tabs x 2 days,2 tabs x 2 days 1 tab x 2 days then stop. Please watch your blood sugars while on the prednisone. Call for blood sugars greater than 250. We will treat you with a z-pack for your bronchitis. This is an antibiotic. Take probiotics while on the antibiotic. These can be purchased over the counter. Please use nasal saline mist for your nasal dryness. Please follow up as needed, as you have surgery in less than 2 weeks and will not be able to come to the office for follow up. Please wear your CPAP machine every night for at least 4-6 hours. Please contact office for sooner follow up if symptoms do not improve or worsen or seek emergency care

## 2015-05-05 ENCOUNTER — Ambulatory Visit (HOSPITAL_COMMUNITY): Payer: Self-pay | Admitting: Psychiatry

## 2015-05-06 ENCOUNTER — Encounter: Payer: Self-pay | Admitting: Internal Medicine

## 2015-05-06 ENCOUNTER — Ambulatory Visit (INDEPENDENT_AMBULATORY_CARE_PROVIDER_SITE_OTHER): Payer: Commercial Managed Care - HMO | Admitting: Internal Medicine

## 2015-05-06 VITALS — BP 104/60 | HR 65 | Temp 98.5°F | Resp 12 | Wt 157.0 lb

## 2015-05-06 DIAGNOSIS — Z794 Long term (current) use of insulin: Secondary | ICD-10-CM

## 2015-05-06 DIAGNOSIS — E039 Hypothyroidism, unspecified: Secondary | ICD-10-CM | POA: Diagnosis not present

## 2015-05-06 DIAGNOSIS — R260 Ataxic gait: Secondary | ICD-10-CM | POA: Diagnosis not present

## 2015-05-06 DIAGNOSIS — M5010 Cervical disc disorder with radiculopathy, unspecified cervical region: Secondary | ICD-10-CM | POA: Diagnosis not present

## 2015-05-06 DIAGNOSIS — E1159 Type 2 diabetes mellitus with other circulatory complications: Secondary | ICD-10-CM

## 2015-05-06 DIAGNOSIS — E119 Type 2 diabetes mellitus without complications: Secondary | ICD-10-CM | POA: Diagnosis not present

## 2015-05-06 DIAGNOSIS — F17210 Nicotine dependence, cigarettes, uncomplicated: Secondary | ICD-10-CM | POA: Diagnosis not present

## 2015-05-06 DIAGNOSIS — E042 Nontoxic multinodular goiter: Secondary | ICD-10-CM | POA: Diagnosis not present

## 2015-05-06 DIAGNOSIS — E1165 Type 2 diabetes mellitus with hyperglycemia: Secondary | ICD-10-CM | POA: Diagnosis not present

## 2015-05-06 DIAGNOSIS — I10 Essential (primary) hypertension: Secondary | ICD-10-CM | POA: Diagnosis not present

## 2015-05-06 DIAGNOSIS — I69354 Hemiplegia and hemiparesis following cerebral infarction affecting left non-dominant side: Secondary | ICD-10-CM | POA: Diagnosis not present

## 2015-05-06 DIAGNOSIS — F250 Schizoaffective disorder, bipolar type: Secondary | ICD-10-CM | POA: Diagnosis not present

## 2015-05-06 DIAGNOSIS — J440 Chronic obstructive pulmonary disease with acute lower respiratory infection: Secondary | ICD-10-CM | POA: Diagnosis not present

## 2015-05-06 NOTE — Progress Notes (Signed)
Patient ID: [name], female   DOB: February 03, 1951, 65 y.o.   MRN: [id number]  HPI: [name] is a 63 y.o.-year-old female, initially referred by her PCP, [name], for management of DM2, dx 1995, insulin-dependent since 1997, uncontrolled, with complications (gastroparesis, cerebrovasc. Ds-  H/o stroke, PN),  Hypothyroidism, thyroid nodules.  Last visit 1.5 mo ago.  She will start Prednisone today for bronchitis.   She will have rectocele surgery in 05/2015.   DM2: Last hemoglobin A1c was: Lab Results  Component Value Date   HGBA1C 6.4* 02/21/2015   HGBA1C 6.2 12/22/2014   HGBA1C 7.0* 09/21/2014  10/28/2013: HbA1c 8.5%  She is now on (decreased in 04/23/2015) - Toujeo 35 >> 25 units at bedtime - Novolog:  14 >> 10 units with a smaller meal  18 >> 14 units with a larger meal Could not tolerate Metformin >> nausea.   Pt checks her sugars 4 a day and they are - after last insulin dose adjustment:  - am:88-147, 184 >> 98, 100-222, 310 >> 81-128, 166 >> 36, 41, 52-100, 122 >> 115-139, 158 - 2h after b'fast: n/c  - before lunch: 84, 96-219, 309 >> 71-131, 170, 261 >> 36, 39, 65-117, 227 >> 129-154, 229 - 2h after lunch: n/c >> 127-154, 229 - before dinner:96, 125-201, 260 >> 98-301, 398 >> 66, 81-150, 170 >> 47-133, 243 >> 125, 212, 289 (may forget ins) - 2h after dinner: n/c - bedtime:  101-193 >> 93, 160-218, 290, 398 >> 98-175 >> 54-150, 269 >> 64x1, 127-242 - nighttime: n/c No lows. Lowest sugar was 69 >> 88x1 >> 84 x1 >> 36 >> 64x1; she has hypoglycemia awareness at 70.  Highest sugar was 200s >> 390s >> 289.  Pt's meals are: - Breakfast: eggs, cereals, fruit, oatmeal, bacon - Lunch: sandwich, beef, mashed potatoes,diet soda - Dinner: chicken, beef, fish, potatoes, vegetables - Snacks: 1-2: fruit She exercises 1x a day - walks.  - + mild CKD, last BUN/creatinine:  Lab Results  Component Value Date   BUN 27* 05/01/2015   CREATININE 1.02* 05/01/2015  On  Losartan. - last set of lipids: Lab Results  Component Value Date   CHOL 96 02/21/2015   HDL 31* 02/21/2015   LDLCALC 35 02/21/2015   TRIG 150* 02/21/2015   CHOLHDL 3.1 02/21/2015  On Ezetimibe + Crestor. - last eye exam was in 10/06/2014. No DR. Had cataract sx B. - + numbness and tingling in her leg L leg (affected by stroke) but also in R. Sees podiatry.  Hypothyroidism: - dx > 10 years ago. - On Levoxyl 50 mcg 6/7 days and 25 1/7 days.  - in am (at 7 am) - fasting - with water - >30 min b'f b'fast - no Ca - no Fe - + MVI at noon - + Aciphex moved at noon  Last TSH: Lab Results  Component Value Date   TSH 1.207 02/21/2015  Previously: Lab Results  Component Value Date   TSH 1.453 08/02/2011   TSH 0.666 09/14/2009   TSH 0.295  05/30/2009   TSH 1.126 03/18/2009   TSH 0.104  09/15/2008   TSH 0.066  09/14/2008   TSH 1.041  02/03/2008   TSH 0.992 09/25/2007   TSH 0.257 08/25/2007   TSH 1.181 05/23/2006   She has a h/o thyroid nodules. + dysphagia, + hoarseness.  Reviewed thyroid U/S (10/2013):  Right thyroid lobe: 3.7 x 1.0 x 1.8 cm   Left thyroid lobe: 3.5 x 1.5  x 1.6 cm   Isthmus: 0.5 cm   Focal nodules: There is a 0.3 mm calcified nodule in the right midzone. There is a 0.6 x 0.7 x 0.5 cm hypoechoic nodule in the  lateral aspect of the right midzone. There is a 0.7 x 0.6 x 0.5 cm hyperechoic nodule in the lateral aspect of the right lower pole.  There is a 2.2 x 1.1 x 1.7 cm complex solid nodule in the inferior aspect of the isthmus extending adjacent to the left lower pole.  There is a 1.4 x 2.1 x 1.4 cm complex solid nodule in the left lower pole.   Lymphadenopathy: None visualized.  Repeat U/S (01/2014): stable appearance of nodules  FNA (2014) x2: benign  She had a L rib fracture in 12/2014.  ROS: Constitutional: no weight loss, no fatigue, + subjective hypothermia, + nocturia Eyes: + blurry vision, no xerophthalmia ENT: no sore throat, no nodules palpated  in throat, no dysphagia/no odynophagia, no hoarseness Cardiovascular: no CP/+ SOB/no palpitations/leg swelling Respiratory: no cough/+ SOB Gastrointestinal: no N/V/D/C Musculoskeletal: + muscle/no joint aches Skin: no rashes Neurological: no tremors/numbness/tingling/dizziness  I reviewed pt's medications, allergies, PMH, social hx, family hx, and changes were documented in the history of present illness. Otherwise, unchanged from my initial visit note - except stopped Remeron, started Risperdal: Past Medical History  Diagnosis Date  . Bipolar disorder ([place])   . CVA (cerebral infarction)   . Pancreatitis 2006    due to Depakote with normal EUS   . Osteoporosis   . Chronic back pain   . Trigger finger   . Anxiety disorder   . Hypertension   . Migraines     chronic headaches  . Diverticulosis     TCS 9/08 by Dr. Delfin Edis for diarrhea . Bx for micro scopic colitis negative.   . Schatzki's ring     non critical / EGD with ED 8/2011with RMR  . S/P colonoscopy 3009,2330, 2011    left-sided diverticula, hx of simple adenomas . 2011, random bx negative for microscopic colitis  . Glaucoma   . Allergic rhinitis   . Hypothyroidism     thyroid goiter  . Anemia   . Blood transfusion   . GERD (gastroesophageal reflux disease)   . Stroke [facility])     left sided weakness  . Seizures ([place])     unknown etiology-on meds-last seizure was 3 yerars ago  . Anxiety   . Depression   . Metabolic encephalopathy 0/76/2263  . Sleep apnea     on CPAP  . Arthritis   . Gum symptoms     infection on antibiotic  . Chronic neck pain   . Mononeuritis lower limb   . Frequent falls   . Diabetes mellitus     Type II  . Complication of anesthesia     "hard to put sleep"  . Cervical disc disorder with radiculopathy of cervical region 10/31/2012  . COPD (chronic obstructive pulmonary disease) with chronic bronchitis ([place]) 09/16/2013    Office Spirometry 10/30/2013-submaximal effort based on appearance of  loop and curve. Numbers would fit with severe restriction but her physiologic capability may be better than this. FVC 0.91/44%, and 10.74/45%, FEV1/FVC 0.81, FEF 25-75% 1.43/69%    . Carpal tunnel syndrome of right wrist 05/23/2011  . Hemiplegia affecting non-dominant side, post-stroke ([place]) 08/02/2011  . Pulmonary hypertension ([place]) 02/22/2015    45 mmHg   . LUE weakness 02/21/15    Left hand drop  Past Surgical History  Procedure Laterality Date  . Abdominal hysterectomy  1978  . Cholecystectomy  1984  . Ovarian cyst removal    . Carpal tunnel release Left 07/22/04    Dr. Aline Brochure  . Breast reduction surgery  1994  . Cataract extraction Bilateral   . Biopsy thyroid  2009  . Surgical excision of 3 tumors from right thigh and right buttock  and left upper thigh  2010  . Back surgery  July 2012  . Spine surgery  09/29/2010    Dr. Rolena Infante  . Maloney dilation  12/29/2010    RMR;  . Esophagogastroduodenoscopy  12/29/2010    Rourk-Retained food in the esophagus and stomach, small hiatal hernia, status post Maloney dilation of the esophagus  . Craniotomy  11/23/2011    Procedure: CRANIOTOMY TUMOR EXCISION;  Surgeon: Hosie Spangle, MD;  Location: [place];  Service: Neurosurgery;  Laterality: N/A;  Craniotomy for tumor resection  . Brain surgery  11/2011    resection of meningioma  . Colonoscopy N/A 09/25/2012    LZJ:[id number] diverticulosis.  colonic polyp-removed : tubular adenoma  . Esophagogastroduodenoscopy N/A 09/25/2012    XTK:[id number] atonic baggy esophagus status post Maloney dilation 33 F. Hiatal hernia  . Givens capsule study N/A 01/15/2013    NORMAL.   . Bacterial overgrowth test N/A 05/05/2013    Procedure: BACTERIAL OVERGROWTH TEST;  Surgeon: Daneil Dolin, MD;  Location: [facility];  Service: Endoscopy;  Laterality: N/A;  7:30  . Transthoracic echocardiogram  2010    EF 60-65%, mild conc LVH, grade 1 diastolic dysfunction; mildly calcified MV annulus with mildly  thickened leaflets, mildly calcified MR annulus  . Nm myocar perf wall motion  2006    "relavtiely normal" persantine, mild anterior thinning (breast attenuation artifact), no region of scar/ischemia  . Cardiac catheterization  05/10/2005    normal coronaries, normal LV systolic function and EF (Dr. Jackie Plum)  . Tooth extraction Bilateral 12/14/2014    Procedure: REMOVAL OF BILATERAL MANDIBULAR EXOSTOSES;  Surgeon: Diona Browner, DDS;  Location: [place];  Service: Oral Surgery;  Laterality: Bilateral;   History   Social History  . Marital Status: Divorced    Spouse Name: N/A    Number of Children: 1  . Years of Education: 12   Occupational History  . disabled     Social History Main Topics  . Smoking status: Current Every Day Smoker -- 0.25 packs/day for 7 years    Types: Cigarettes  . Smokeless tobacco: Never Used     Comment: "started back but off now for 3 months" (08/18/13)  . Alcohol Use: No     Comment:    . Drug Use: No   Current Outpatient Rx  Name  Route  Sig  Dispense  Refill  . ACCU-CHEK FASTCLIX LANCETS MISC      Use to test blood sugar 4 times daily. Dx: E10.65   408 each   1   . albuterol (PROVENTIL HFA;VENTOLIN HFA) 108 (90 BASE) MCG/ACT inhaler   Inhalation   Inhale 1 puff into the lungs 3 (three) times daily.   1 Inhaler   2   . Alcohol Swabs (B-D SINGLE USE SWABS REGULAR) PADS      Use for injections and glucose testing 8 times daily. Dx: E10.65   900 each   1   . amLODipine (NORVASC) 5 MG tablet   Oral   Take 1 tablet (5 mg total) by mouth daily.  30 tablet   3     Increase dose of amlodipine effective 12/30/2014   . aspirin EC 81 MG tablet   Oral   Take 81 mg by mouth daily.         [name] azithromycin (ZITHROMAX) 250 MG tablet      Take 2 tablets by mouth today, then take 1 tablet daily until gone Patient taking differently: Take 250-500 mg by mouth daily. Take 2 tablets by mouth today, then take 1 tablet daily until gone   6 tablet   0     . BD PEN NEEDLE NANO U/F 32G X 4 MM MISC      USE FOUR TIMES DAILY   360 each   3   . Blood Glucose Calibration (ACCU-CHEK SMARTVIEW CONTROL) LIQD   Other   1 each by Other route as needed.   1 each   2   . Blood Glucose Monitoring Suppl (ACCU-CHEK NANO SMARTVIEW) W/DEVICE KIT   Does not apply   1 each by Does not apply route daily. Dx: E10.65   1 kit   0   . Cholecalciferol (D 5000) 5000 UNITS capsule   Oral   Take 5,000 Units by mouth daily.         . clobetasol cream (TEMOVATE) 0.05 %   Topical   Apply 1 application topically daily.         . cloNIDine (CATAPRES) 0.3 MG tablet      TAKE 1 TABLET BY MOUTH EVERY EIGHT HOURS. ONCE AT 8AM, 4PM, AND 12 MIDNIGHT.   90 tablet   3   . diclofenac sodium (VOLTAREN) 1 % GEL   Topical   Apply 2 g topically daily as needed (Pain).   100 g   0   . dicyclomine (BENTYL) 10 MG capsule      TAKE 1 CAPSULE BY M OUTH THREE TIMES DAILY BEFORE MEALS.   90 capsule   3   . gentamicin (GARAMYCIN) 0.3 % ophthalmic solution   Left Eye   Place 1 drop into the left eye 3 (three) times daily.   5 mL   0   . glucose blood (ACCU-CHEK SMARTVIEW) test strip      Use to test blood sugar 4 times daily as instructed. Dx: E10.65   375 each   1   . hydroxychloroquine (PLAQUENIL) 200 MG tablet   Oral   Take 200 mg by mouth daily.         . insulin aspart (NOVOLOG FLEXPEN) 100 UNIT/ML FlexPen   Subcutaneous   Inject 14-18 Units into the skin 3 (three) times daily with meals.   30 mL   1   . Insulin Glargine (TOUJEO SOLOSTAR) 300 UNIT/ML SOPN   Subcutaneous   Inject 35 Units into the skin at bedtime.   6 pen   1   . lamoTRIgine (LAMICTAL) 100 MG tablet      TAKE 1 TABLET BY MOUTH TWICE DAILY.   60 tablet   5   . levothyroxine (SYNTHROID, LEVOTHROID) 50 MCG tablet      TAKE 1 TABLET BY MOUTH ONCE DAILY AND 1/2 TABLET ON SUNDAYS. Patient taking differently: Take 25-50 mcg by mouth daily. TAKE 1 TABLET BY MOUTH ONCE  DAILY AND 1/2 TABLET ON SUNDAYS.   30 tablet   2   . LORazepam (ATIVAN) 0.5 MG tablet   Oral   Take 1 tablet (0.5 mg total) by mouth 3 (three) times daily.  90 tablet   2   . lubiprostone (AMITIZA) 24 MCG capsule   Oral   Take 1 capsule (24 mcg total) by mouth 2 (two) times daily with a meal.   60 capsule   2   . methocarbamol (ROBAXIN) 500 MG tablet   Oral   Take 500 mg by mouth daily.         . metoprolol (LOPRESSOR) 50 MG tablet      TAKE 1 TABLET BY MOUTH TWICE DAILY.   60 tablet   6   . montelukast (SINGULAIR) 10 MG tablet   Oral   Take 1 tablet (10 mg total) by mouth daily.   30 tablet   2   . Multiple Vitamins-Minerals (ONE-A-DAY 50 PLUS PO)   Oral   Take 1 tablet by mouth daily.         [name] Nystatin (NYAMYC) 100000 UNIT/GM POWD      APPLY TO AFFECTED AREA 4 TIMES DAILY.   30 g   2   . oxybutynin (DITROPAN) 5 MG tablet      One tablet once daily Patient taking differently: Take 5 mg by mouth daily. One tablet once daily   30 tablet   3   . polyethylene glycol powder (MIRALAX) powder   Oral   Take 17 g by mouth daily. To prevent constipation   255 g   prn   . predniSONE (DELTASONE) 10 MG tablet      Take 4 tabs x 2 days, 2 tabs x 2 days 1 tab x 2 days then stop. Patient taking differently: Take 10-40 mg by mouth See admin instructions. Take 4 tabs x 2 days, 2 tabs x 2 days 1 tab x 2 days then stop.   14 tablet   0   . pregabalin (LYRICA) 75 MG capsule   Oral   Take 75 mg by mouth 2 (two) times daily.          . promethazine (PHENERGAN) 12.5 MG tablet   Oral   Take 1 tablet (12.5 mg total) by mouth every 6 (six) hours as needed for nausea or vomiting.   30 tablet   0   . promethazine-dextromethorphan (PROMETHAZINE-DM) 6.25-15 MG/5ML syrup      One teaspoon at bedtime as needed , for excess cough Patient taking differently: Take 5 mLs by mouth at bedtime as needed for cough. One teaspoon at bedtime as needed , for excess cough   180  mL   0   . RABEprazole (ACIPHEX) 20 MG tablet      TAKE 1 TABLET BY MOUTH TWICE DAILY.   60 tablet   3   . RESTASIS 0.05 % ophthalmic emulsion   Both Eyes   Place 1 drop into both eyes 2 (two) times daily.          . risperiDONE (RISPERDAL) 0.5 MG tablet   Oral   Take 1 tablet (0.5 mg total) by mouth at bedtime.   30 tablet   2   . rosuvastatin (CRESTOR) 5 MG tablet      TAKE 1 TABLET BY MOUTH AT BEDTIME.   30 tablet   4   . sertraline (ZOLOFT) 100 MG tablet   Oral   Take 2 tablets (200 mg total) by mouth daily.   60 tablet   2   . traZODone (DESYREL) 150 MG tablet   Oral   Take 2 tablets (300 mg total) by mouth at bedtime.   60 tablet  2   . vitamin E 400 UNIT capsule   Oral   Take 400 Units by mouth daily.         [name] ZETIA 10 MG tablet      TAKE ONE TABLET BY MOUTH ONCE DAILY.   30 tablet   4      Allergies  Allergen Reactions  . Cephalexin Hives  . Iron Nausea And Vomiting  . Milk-Related Compounds Other (See Comments)    Doesn't agree with stomach.   . Penicillins Hives    Has patient had a PCN reaction causing immediate rash, facial/tongue/throat swelling, SOB or lightheadedness with hypotension: Yes Has patient had a PCN reaction causing severe rash involving mucus membranes or skin necrosis: No Has patient had a PCN reaction that required hospitalization No Has patient had a PCN reaction occurring within the last 10 years: No If all of the above answers are "NO", then may proceed with Cephalosporin use.   [name] Phenazopyridine Hcl Hives         Family History  Problem Relation Age of Onset  . Heart attack Mother     HTN  . Pneumonia Father   . Kidney failure Father   . Diabetes Father   . Pancreatic cancer Sister   . Diabetes Brother   . Hypertension Brother   . Diabetes Brother   . Colon cancer Neg Hx   . Anesthesia problems Neg Hx   . Hypotension Neg Hx   . Malignant hyperthermia Neg Hx   . Pseudochol deficiency Neg Hx   .  Stroke Maternal Grandmother   . Heart attack Maternal Grandfather   . Cancer Sister     breast   . Alcohol abuse Maternal Uncle   . Hypertension Brother   . Hypertension Son   . Sleep apnea Son    PE: BP 104/60 mmHg  Pulse 65  Temp(Src) 98.5 F (36.9 C) (Oral)  Resp 12  Wt 157 lb (71.215 kg)  SpO2 99% Body mass index is 29.68 kg/(m^2).  Wt Readings from Last 3 Encounters:  05/06/15 157 lb (71.215 kg)  05/03/15 159 lb (72.122 kg)  05/01/15 155 lb (70.308 kg)   Constitutional: overweight, in NAD but again appears somnolent Eyes: surgical pupils, EOMI, no exophthalmos ENT: moist mucous membranes, no thyromegaly, large thyroid nodule felt in isthmus, no cervical lymphadenopathy Cardiovascular: RRR, No MRG Respiratory: CTA B Gastrointestinal: abdomen soft, NT, ND, BS+ Musculoskeletal: no deformities, strength intact in all 4 Skin: moist, warm, no rashes Neurological: no tremor with outstretched hands, DTR normal in all 4  ASSESSMENT: 1. DM2, insulin-dependent, uncontrolled, without complications - gastroparesis - cerebrovasc. Ds -  H/o stroke - PN  2. Hypothyroidism  3. MNG - Thyroid U/S (10/11/2012):  Right thyroid lobe: 3.7 x 1.0 x 1.8 cm  Left thyroid lobe: 3.5 x 1.5 x 1.6 cm  Isthmus: 0.5 cm  Focal nodules: There is a 0.3 mm calcified nodule in the right midzone. There is a 0.6 x 0.7 x 0.5 cm hypoechoic nodule in the lateral aspect of the right midzone. There is a 0.7 x 0.6 x 0.5 cm hyperechoic nodule in the lateral aspect of the right lower pole. There is a 2.2 x 1.1 x 1.7 cm complex solid nodule in the inferior aspect of the isthmus extending adjacent to the left lower pole. There is a 1.4 x 2.1 x 1.4 cm complex solid nodule in the left lower pole.  Lymphadenopathy: None visualized.  IMPRESSION: Multi nodular goiter which has  markedly progressed since the prior exam. The dominant nodule at the inferior aspect of the isthmus fits criteria for fine  needle aspiration biopsy if not previously Assessed.  - FNA (11/05/2012) x2: benign  - Thyroid U/S (01/20/2014) - felt one nodule enlarging >> new U/S: stable appearance of the nodules, except a small new nodule in isthmus: 1.2 cm   Right thyroid lobe Measurements: 4.4 cm x 1.2 cm x 1.7 cm. Multiple right-sided nodules identified. Each right-sided nodule demonstrates increased echogenicity, with the superior measuring 7 mm -8 mm, most inferior measuring 9 mm - 10 mm. A small, 3 mm focus of calcium with posterior shadowing is evident. There is also a mid nodule measuring 7 mm, which is echogenic.  Left thyroid lobe Measurements: 5.1 cm x 2.1 cm x 2.3 cm. Dominant lesion at the inferior pole of left thyroid is again evident, which has been previously biopsied (10/29/2012). This nodule measures 1.8 cm x 1.7cm x 2.5 cm. (Previous 1.4 cm x 2.1 cm x 1.4 cm)  Isthmus Thickness: 4 mm-5 mm. Isthmic nodule again noted, previously biopsied (10/29/2012). Currently this nodule measures 2.2 cm x 1.4 cm x 1.8 cm. (previous measurement 2.2 cm x 1.1 cm x 1.7 cm). There is a new nodule identified within the isthmus with heterogeneously hyperechoic characteristics. This nodule measures 12 mm x 5.4 mm x 7.2 mm.  Lymphadenopathy: None visualized.   PLAN:  1. Patient with long-standing, uncontrolled diabetes, on basal-bolus regimen, with low sugars now, in the 30's, due to which we have been decreasing her insulin doses. Less lows after last decrease 2 weeks ago, but sugars before dinner >> higher >> will advise her to take more insulin with lunch and also to increase NovoLog when starting Prednisone. - last HbA1c <7% - I advised her to:  Patient Instructions  Please continue: - Toujeo 25 units at bedtime - Novolog:  10 units with a smaller meal  14 units with a larger meal or with lunch  When taking steroids, you will likely need 14-16 units Novolog with meals.  Please return in 1.5 months with your  sugar log.   - continue checking sugars at different times of the day - check 3-4 times a day, rotating checks - advised for yearly eye exams >> she is UTD - Return to clinic in 1.5 mo with sugar log   2. Hypothyroidism - latest TFTs normal 2 mo ago - we discussed how to take the thyroid hormone every day, with water, >30 minutes before breakfast, separated by >4 hours from acid reflux medications, calcium, iron, multivitamins. She is taking the LT4 correctly. - continue LT4 50 mcg daily and on Sunday, 25 mcg.  3. MNG - previously had 2 normal Bx's (2014). Reviewed the ultrasounds from 2014 in 2015. - will need a new U/S in 2 years from previous (01/2016).

## 2015-05-06 NOTE — Patient Instructions (Addendum)
Patient Instructions  Please continue: - Toujeo 25 units at bedtime - Novolog:  10 units with a smaller meal  14 units with a larger meal or with lunch  When taking steroids, you will likely need 14-16 units Novolog with meals.  Please return in 1.5 months with your sugar log.

## 2015-05-07 DIAGNOSIS — K1329 Other disturbances of oral epithelium, including tongue: Secondary | ICD-10-CM | POA: Diagnosis not present

## 2015-05-09 DIAGNOSIS — M79609 Pain in unspecified limb: Secondary | ICD-10-CM | POA: Diagnosis not present

## 2015-05-10 ENCOUNTER — Telehealth: Payer: Self-pay | Admitting: Internal Medicine

## 2015-05-10 ENCOUNTER — Ambulatory Visit (INDEPENDENT_AMBULATORY_CARE_PROVIDER_SITE_OTHER): Payer: Commercial Managed Care - HMO | Admitting: Family Medicine

## 2015-05-10 ENCOUNTER — Encounter: Payer: Self-pay | Admitting: Family Medicine

## 2015-05-10 VITALS — BP 136/60 | HR 64 | Resp 18 | Ht 61.0 in | Wt 157.0 lb

## 2015-05-10 DIAGNOSIS — I10 Essential (primary) hypertension: Secondary | ICD-10-CM | POA: Diagnosis not present

## 2015-05-10 DIAGNOSIS — N3090 Cystitis, unspecified without hematuria: Secondary | ICD-10-CM | POA: Insufficient documentation

## 2015-05-10 DIAGNOSIS — N3001 Acute cystitis with hematuria: Secondary | ICD-10-CM

## 2015-05-10 DIAGNOSIS — E1159 Type 2 diabetes mellitus with other circulatory complications: Secondary | ICD-10-CM | POA: Diagnosis not present

## 2015-05-10 DIAGNOSIS — J0110 Acute frontal sinusitis, unspecified: Secondary | ICD-10-CM | POA: Diagnosis not present

## 2015-05-10 DIAGNOSIS — F3161 Bipolar disorder, current episode mixed, mild: Secondary | ICD-10-CM

## 2015-05-10 DIAGNOSIS — N816 Rectocele: Secondary | ICD-10-CM

## 2015-05-10 DIAGNOSIS — E785 Hyperlipidemia, unspecified: Secondary | ICD-10-CM | POA: Diagnosis not present

## 2015-05-10 DIAGNOSIS — E1165 Type 2 diabetes mellitus with hyperglycemia: Secondary | ICD-10-CM

## 2015-05-10 DIAGNOSIS — J321 Chronic frontal sinusitis: Secondary | ICD-10-CM | POA: Insufficient documentation

## 2015-05-10 DIAGNOSIS — N3000 Acute cystitis without hematuria: Secondary | ICD-10-CM | POA: Diagnosis not present

## 2015-05-10 LAB — POCT URINALYSIS DIPSTICK
Bilirubin, UA: NEGATIVE
Glucose, UA: NEGATIVE
Nitrite, UA: NEGATIVE
PROTEIN UA: NEGATIVE
UROBILINOGEN UA: 0.2
pH, UA: 5.5

## 2015-05-10 MED ORDER — AZITHROMYCIN 250 MG PO TABS: ORAL_TABLET | ORAL | Status: DC

## 2015-05-10 NOTE — Telephone Encounter (Signed)
[facility] note dated 05/08/15 at 7:22 pm Caller states she is on prednisone since Thursday for a lung infection and now her BSs are over 400. Is was 412 at lunch. SHe took her prescribed insulin and took a walk, drank some more water, it is currently 417. On call provider was paged new orders from Dr. Randel Pigg was place for 4 u novolog

## 2015-05-10 NOTE — Assessment & Plan Note (Signed)
1 week h/o pain and drainage, z pack  Prescribed, states antibiotic course prescribed last week was d/c due to stomach cramps

## 2015-05-10 NOTE — Progress Notes (Signed)
Subjective:    Patient ID: [name], female    DOB: Jul 15, 1950, 65 y.o.   MRN: [id number]  HPI   [name]     MRN: [id number]      DOB: [birth year]   HPI [name] is here for follow up and re-evaluation of chronic medical conditions, medication management and review of any available recent lab and radiology data.  Preventive health is updated, specifically  Cancer screening and Immunization.   Questions or concerns regarding consultations or procedures which the PT has had in the interim are  addressed. The PT denies any adverse reactions to current medications since the last visit.  1 week h/o increased frontal pressure and thick at times yellow drainage Increased anxiety , resisted therapy in past but will re consider   ROS Denies recent fever or chills. Denies sinus pressure, nasal congestion, ear pain or sore throat. Denies chest congestion, productive cough or wheezing. Denies chest pains, palpitations and leg swelling Denies abdominal pain, nausea, vomiting,diarrhea or constipation.   C/o dysuria, frequency, x 1 week. Denies uncontrolled  joint pain, swelling and limitation in mobility. Denies headaches, seizures, numbness, or tingling. C/o increased anxiety, worries over her family a lot and feels as though her son's  Girlfriend is pushing him away from her. Denies skin break down or rash.   PE  BP 136/60 mmHg  Pulse 64  Resp 18  Ht 5\' 1"  (1.549 m)  Wt 157 lb (71.215 kg)  BMI 29.68 kg/m2  SpO2 96%  Patient alert and oriented and in no cardiopulmonary distress.  HEENT: No facial asymmetry, EOMI,   oropharynx pink and moist.  Neck supple no JVD, no mass. Frontal sinus tenderness. TM clear Chest: Clear to auscultation bilaterally.  CVS: S1, S2 no murmurs, no S3.Regular rate.  ABD: Soft non tender.   Ext: No edema  MS: Adequate though reduced  ROM spine, normal in  shoulders, hips and knees.  Skin: Intact, no ulcerations or rash noted.  Psych: Good  eye contact, normal affect. Memory intact not anxious or depressed appearing.  CNS: CN 2-12 intact, power,  normal throughout.no focal deficits noted.   Assessment & Plan  Essential hypertension Controlled, no change in medication DASH diet and commitment to daily physical activity for a minimum of 30 minutes discussed and encouraged, as a part of hypertension management. The importance of attaining a healthy weight is also discussed.  BP/Weight 05/10/2015 05/06/2015 05/03/2015 05/01/2015 04/26/2015 04/21/2015 [id number]  Systolic BP [id number] 123456 [id number] [id number] [id number] 0000000  Diastolic BP 60 60 62 62 76 58 72  Wt. (Lbs) 157 157 159 155 160 - 156  BMI 29.68 29.68 30.06 29.3 30.25 - 29.49        Poorly controlled type 2 diabetes mellitus with circulatory disorder (HCC) Treated by endo and controlled [name] is reminded of the importance of commitment to daily physical activity for 30 minutes or more, as able and the need to limit carbohydrate intake to 30 to 60 grams per meal to help with blood sugar control.   The need to take medication as prescribed, test blood sugar as directed, and to call between visits if there is a concern that blood sugar is uncontrolled is also discussed.   [name] is reminded of the importance of daily foot exam, annual eye examination, and good blood sugar, blood pressure and cholesterol control.  Diabetic Labs Latest Ref Rng 05/01/2015 03/02/2015 02/22/2015 02/21/2015 02/21/2015  HbA1c 4.8 - 5.6 % - - - - -  Microalbumin <2.0 mg/dL - - - - -  Micro/Creat Ratio 0.0 - 30.0 mg/g - - - - -  Chol 0 - 200 mg/dL - - - 96 -  HDL >40 mg/dL - - - 31(L) -  Calc LDL 0 - 99 mg/dL - - - 35 -  Triglycerides <150 mg/dL - - - 150(H) -  Creatinine 0.44 - 1.00 mg/dL 1.02(H) 1.23(H) 1.16(H) - 1.50(H)  GFR >60.00 mL/min - - - - -   BP/Weight 05/10/2015 05/06/2015 05/03/2015 05/01/2015 04/26/2015 04/21/2015 [id number]  Systolic BP [id number] 123456 [id number] 123456 [id number] 0000000  Diastolic BP 60 60 62 62 76 58 72    Wt. (Lbs) 157 157 159 155 160 - 156  BMI 29.68 29.68 30.06 29.3 30.25 - 29.49   Foot/eye exam completion dates Latest Ref Rng 10/06/2014 10/03/2013  Eye Exam No Retinopathy No Retinopathy No Retinopathy  Foot exam Order - - -  Foot Form Completion - - -          Hyperlipidemia Stop zetia, fasting labs in May, continue crestor  Frontal sinusitis 1 week h/o pain and drainage, z pack  Prescribed, states antibiotic course prescribed last week was d/c due to stomach cramps  Rectocele, female Repair planned for March, 2017  Acute cystitis Mildly symptomatic, urine mildly abnormal  Will await c/s report  Bipolar disorder ([place]) Increased anxiety and mild depression, worried over her family, agrees to see therapist     Review of Systems     Objective:   Physical Exam        Assessment & Plan:

## 2015-05-10 NOTE — Assessment & Plan Note (Signed)
Stop zetia, fasting labs in May, continue crestor

## 2015-05-10 NOTE — Assessment & Plan Note (Signed)
Increased anxiety and mild depression, worried over her family, agrees to see therapist

## 2015-05-10 NOTE — Assessment & Plan Note (Signed)
Repair planned for March, 2017

## 2015-05-10 NOTE — Assessment & Plan Note (Signed)
Treated by endo and controlled [name] is reminded of the importance of commitment to daily physical activity for 30 minutes or more, as able and the need to limit carbohydrate intake to 30 to 60 grams per meal to help with blood sugar control.   The need to take medication as prescribed, test blood sugar as directed, and to call between visits if there is a concern that blood sugar is uncontrolled is also discussed.   [name] is reminded of the importance of daily foot exam, annual eye examination, and good blood sugar, blood pressure and cholesterol control.  Diabetic Labs Latest Ref Rng 05/01/2015 03/02/2015 02/22/2015 02/21/2015 02/21/2015  HbA1c 4.8 - 5.6 % - - - - -  Microalbumin <2.0 mg/dL - - - - -  Micro/Creat Ratio 0.0 - 30.0 mg/g - - - - -  Chol 0 - 200 mg/dL - - - 96 -  HDL >40 mg/dL - - - 31(L) -  Calc LDL 0 - 99 mg/dL - - - 35 -  Triglycerides <150 mg/dL - - - 150(H) -  Creatinine 0.44 - 1.00 mg/dL 1.02(H) 1.23(H) 1.16(H) - 1.50(H)  GFR >60.00 mL/min - - - - -   BP/Weight 05/10/2015 05/06/2015 05/03/2015 05/01/2015 04/26/2015 04/21/2015 [id number]  Systolic BP [id number] 123456 [id number] 123456 [id number] 0000000  Diastolic BP 60 60 62 62 76 58 72  Wt. (Lbs) 157 157 159 155 160 - 156  BMI 29.68 29.68 30.06 29.3 30.25 - 29.49   Foot/eye exam completion dates Latest Ref Rng 10/06/2014 10/03/2013  Eye Exam No Retinopathy No Retinopathy No Retinopathy  Foot exam Order - - -  Foot Form Completion - - -

## 2015-05-10 NOTE — Assessment & Plan Note (Signed)
Controlled, no change in medication DASH diet and commitment to daily physical activity for a minimum of 30 minutes discussed and encouraged, as a part of hypertension management. The importance of attaining a healthy weight is also discussed.  BP/Weight 05/10/2015 05/06/2015 05/03/2015 05/01/2015 04/26/2015 04/21/2015 [id number]  Systolic BP [id number] 123456 [id number] 123456 [id number] 0000000  Diastolic BP 60 60 62 62 76 58 72  Wt. (Lbs) 157 157 159 155 160 - 156  BMI 29.68 29.68 30.06 29.3 30.25 - 29.49

## 2015-05-10 NOTE — Assessment & Plan Note (Signed)
Mildly symptomatic, urine mildly abnormal  Will await c/s report

## 2015-05-10 NOTE — Patient Instructions (Addendum)
Annual wellness last week END mAY, call if you need me sooner  I recommend you see a therapist to help with depression and anxiety, continue with Dr Harrington Challenger. I understand your ongoing concern about your family  Please plan to stop smoking to improve your health  STOP [place] hAVE COMPLETED CURRENT BOTTLE, ONLY CRESTOR FOR CHOLESTEROL  URINE CHECKED FOR INFECTION BASED ON SYMPTOMS, suggests infection we will follow up on this  Z pack sent for treating sinus infection, take entire course    Good luck with move and upcoming surgery in March  fASTING LIPID, CMP AND Egfr 1 WEK BEFORE mAY VISIT

## 2015-05-11 ENCOUNTER — Other Ambulatory Visit: Payer: Self-pay | Admitting: Family Medicine

## 2015-05-11 ENCOUNTER — Other Ambulatory Visit: Payer: Self-pay | Admitting: Internal Medicine

## 2015-05-11 ENCOUNTER — Other Ambulatory Visit (HOSPITAL_COMMUNITY): Payer: Self-pay | Admitting: Psychiatry

## 2015-05-11 ENCOUNTER — Other Ambulatory Visit: Payer: Self-pay | Admitting: Gastroenterology

## 2015-05-11 LAB — URINE CULTURE: Colony Count: 80000

## 2015-05-11 NOTE — Telephone Encounter (Signed)
REFILL 

## 2015-05-11 NOTE — Telephone Encounter (Signed)
Please read message below and advise.  

## 2015-05-11 NOTE — Telephone Encounter (Signed)
Per last visit's instructions:  Please continue: - Toujeo 25 units at bedtime - Novolog:  10 units with a smaller meal  14 units with a larger meal or with lunch  When taking steroids, you will likely need 14-16 units Novolog with meals.  Is she taking 14-16 units of Novolog before meals? If so, she may need to increase to 18-20 units.

## 2015-05-12 NOTE — Telephone Encounter (Signed)
Called pt and advised her. Pt voiced understanding. Pt stated her bs was 151 this morning. 200's last night. Pt had a few lows b/c her appetite was decreased and she did not eat a lot. Advised pt to keep an eye on her sugars and call us if they go below 90; she may need to decrease the Novolog. Pt voiced understanding.

## 2015-05-13 ENCOUNTER — Other Ambulatory Visit: Payer: Self-pay

## 2015-05-13 ENCOUNTER — Other Ambulatory Visit: Payer: Self-pay | Admitting: Family Medicine

## 2015-05-13 DIAGNOSIS — F17210 Nicotine dependence, cigarettes, uncomplicated: Secondary | ICD-10-CM | POA: Diagnosis not present

## 2015-05-13 DIAGNOSIS — I10 Essential (primary) hypertension: Secondary | ICD-10-CM | POA: Diagnosis not present

## 2015-05-13 DIAGNOSIS — M5010 Cervical disc disorder with radiculopathy, unspecified cervical region: Secondary | ICD-10-CM | POA: Diagnosis not present

## 2015-05-13 DIAGNOSIS — R260 Ataxic gait: Secondary | ICD-10-CM | POA: Diagnosis not present

## 2015-05-13 DIAGNOSIS — F250 Schizoaffective disorder, bipolar type: Secondary | ICD-10-CM | POA: Diagnosis not present

## 2015-05-13 DIAGNOSIS — E119 Type 2 diabetes mellitus without complications: Secondary | ICD-10-CM | POA: Diagnosis not present

## 2015-05-13 DIAGNOSIS — I69354 Hemiplegia and hemiparesis following cerebral infarction affecting left non-dominant side: Secondary | ICD-10-CM | POA: Diagnosis not present

## 2015-05-13 DIAGNOSIS — J440 Chronic obstructive pulmonary disease with acute lower respiratory infection: Secondary | ICD-10-CM | POA: Diagnosis not present

## 2015-05-13 DIAGNOSIS — Z794 Long term (current) use of insulin: Secondary | ICD-10-CM | POA: Diagnosis not present

## 2015-05-13 MED ORDER — ERYTHROMYCIN BASE 500 MG PO TBEC
500.0000 mg | DELAYED_RELEASE_TABLET | Freq: Three times a day (TID) | ORAL | Status: DC
Start: 2015-05-13 — End: 2015-05-19

## 2015-05-14 ENCOUNTER — Inpatient Hospital Stay (HOSPITAL_COMMUNITY): Admission: RE | Admit: 2015-05-14 | Payer: Self-pay | Source: Ambulatory Visit

## 2015-05-17 ENCOUNTER — Telehealth: Payer: Self-pay | Admitting: Family Medicine

## 2015-05-17 DIAGNOSIS — R109 Unspecified abdominal pain: Secondary | ICD-10-CM

## 2015-05-17 DIAGNOSIS — I10 Essential (primary) hypertension: Secondary | ICD-10-CM

## 2015-05-17 NOTE — Telephone Encounter (Signed)
[name] is asking to speak to the nurse about a medical problem , please advise?

## 2015-05-18 NOTE — Telephone Encounter (Signed)
No c/s sent pls request a urine c/s  I have ordered US of kidneys, let her know

## 2015-05-18 NOTE — Telephone Encounter (Signed)
Patient states that she is having left sided flank pain that continues after antibiotic use.  She is still having frequency.  Please advise of next steps.

## 2015-05-18 NOTE — Telephone Encounter (Signed)
Noted , will further address in am

## 2015-05-18 NOTE — Telephone Encounter (Signed)
Do you want to bring her in for another urine specimen.  According to comments C&S due to the type of bacteria growth

## 2015-05-19 ENCOUNTER — Telehealth (HOSPITAL_COMMUNITY): Payer: Self-pay | Admitting: *Deleted

## 2015-05-19 ENCOUNTER — Ambulatory Visit (INDEPENDENT_AMBULATORY_CARE_PROVIDER_SITE_OTHER): Payer: Commercial Managed Care - HMO | Admitting: Family Medicine

## 2015-05-19 ENCOUNTER — Ambulatory Visit (HOSPITAL_COMMUNITY)
Admission: RE | Admit: 2015-05-19 | Discharge: 2015-05-19 | Disposition: A | Discharge status: Home / Self Care | Payer: Commercial Managed Care - HMO | Source: Ambulatory Visit | Attending: Family Medicine | Admitting: Family Medicine

## 2015-05-19 ENCOUNTER — Encounter: Payer: Self-pay | Admitting: Family Medicine

## 2015-05-19 ENCOUNTER — Other Ambulatory Visit: Payer: Self-pay | Admitting: *Deleted

## 2015-05-19 VITALS — BP 126/70 | HR 70 | Temp 98.9°F | Resp 18 | Ht 61.0 in | Wt 159.0 lb

## 2015-05-19 DIAGNOSIS — I10 Essential (primary) hypertension: Secondary | ICD-10-CM

## 2015-05-19 DIAGNOSIS — Z720 Tobacco use: Secondary | ICD-10-CM

## 2015-05-19 DIAGNOSIS — R109 Unspecified abdominal pain: Secondary | ICD-10-CM

## 2015-05-19 DIAGNOSIS — Z1211 Encounter for screening for malignant neoplasm of colon: Secondary | ICD-10-CM

## 2015-05-19 DIAGNOSIS — F17210 Nicotine dependence, cigarettes, uncomplicated: Secondary | ICD-10-CM

## 2015-05-19 DIAGNOSIS — M5442 Lumbago with sciatica, left side: Secondary | ICD-10-CM | POA: Diagnosis not present

## 2015-05-19 DIAGNOSIS — E1159 Type 2 diabetes mellitus with other circulatory complications: Secondary | ICD-10-CM

## 2015-05-19 DIAGNOSIS — R10A2 Flank pain, left side: Secondary | ICD-10-CM

## 2015-05-19 DIAGNOSIS — E1165 Type 2 diabetes mellitus with hyperglycemia: Secondary | ICD-10-CM

## 2015-05-19 DIAGNOSIS — E038 Other specified hypothyroidism: Secondary | ICD-10-CM

## 2015-05-19 LAB — POCT URINALYSIS DIPSTICK
BILIRUBIN UA: NEGATIVE
GLUCOSE UA: NEGATIVE
Ketones, UA: NEGATIVE
NITRITE UA: NEGATIVE
Protein, UA: NEGATIVE
RBC UA: NEGATIVE
Spec Grav, UA: 1.025
UROBILINOGEN UA: 0.2
pH, UA: 5.5

## 2015-05-19 LAB — POC HEMOCCULT BLD/STL (OFFICE/1-CARD/DIAGNOSTIC): FECAL OCCULT BLD: NEGATIVE

## 2015-05-19 MED ORDER — HYDROCODONE-ACETAMINOPHEN 5-325 MG PO TABS: 1.0000 | ORAL_TABLET | Freq: Every day | ORAL | Status: DC | PRN

## 2015-05-19 MED ORDER — KETOROLAC TROMETHAMINE 60 MG/2ML IM SOLN
60.0000 mg | Freq: Once | INTRAMUSCULAR | Status: AC
  Administered 2015-05-19: 60 mg via INTRAMUSCULAR

## 2015-05-19 MED ORDER — METHYLPREDNISOLONE ACETATE 80 MG/ML IJ SUSP
80.0000 mg | Freq: Once | INTRAMUSCULAR | Status: AC
  Administered 2015-05-19: 80 mg via INTRAMUSCULAR

## 2015-05-19 NOTE — Patient Instructions (Addendum)
F/u as before  Call if you need me sooner  Pain you are having I believe is due to arhthritis in spine You need to return to pain management Doctor for review  Toradol and depo medrol in office today, and x rays of hip and abdomen Five days ONLY of hydrocodone one daily is prescribed , and I will let Dr Nelva Bush know CONTINUE NO MORPHINE until you see your pain Doc Urine shows no blood, and stool shows no blood

## 2015-05-19 NOTE — Patient Outreach (Signed)
[name]) Care Management   05/19/2015  [name] 08-03-1950 [id number]  [name] is an 65 y.o. female  Subjective:   Patient has seen MD this am due to her flank pain, was given pain med for 5 days and given 2 injections. Patient reporting she has good relief with the injections at this time. Patient also had x-rays completed and will have a renal u/s on Friday. Patient states her CBGs have been more elevated as she took a prednisone dose pack and antibiotic for her lungs.  Patient also had gum surgery, states the cells were questionable and they are going to do more surgery to remove more tissue.  Patient has new apartment-will on [address]thinks it is apt. [address]) Patient will have outpatient surgery in April  Patient compliant with MD appointments Patient has support services in place-reports CAP aide M-F 6 hours Saturday and Sunday 2 hours each day. [facility] nurse is filling medication box. Patient states [facility] to resume.  Patient agrees to call next month for follow up and discharge from [facility].  Objective:   BP 130/60 mmHg  Pulse 75  Resp 20  Wt 159 lb (72.122 kg)  SpO2 95%  LMP  (Exact Date) Review of Systems  Constitutional: Negative.   HENT: Negative.   Eyes: Negative.   Respiratory: Negative.   Cardiovascular: Negative.   Gastrointestinal: Negative.   Genitourinary: Positive for frequency.  Musculoskeletal: Positive for myalgias.  Skin: Negative.   Neurological: Negative.   Psychiatric/Behavioral: Negative.     Physical Exam  Constitutional: She is oriented to person, place, and time. She appears well-developed.  Neck: Normal range of motion.  Cardiovascular: Normal rate.   Respiratory: Effort normal.  GI: Soft. There is tenderness.  Musculoskeletal: Normal range of motion.  Neurological: She is alert and oriented to person, place, and time.  Skin: Skin is warm and dry.  Psychiatric: She has a normal mood and affect. Her  behavior is normal. Judgment and thought content normal.    Current Medications:   Current Outpatient Prescriptions  Medication Sig Dispense Refill  . ACCU-CHEK FASTCLIX LANCETS MISC Use to test blood sugar 4 times daily. Dx: E10.65 408 each 1  . albuterol (PROVENTIL HFA;VENTOLIN HFA) 108 (90 BASE) MCG/ACT inhaler Inhale 1 puff into the lungs 3 (three) times daily. 1 Inhaler 2  . Alcohol Swabs (B-D SINGLE USE SWABS REGULAR) PADS Use for injections and glucose testing 8 times daily. Dx: E10.65 900 each 1  . amLODipine (NORVASC) 5 MG tablet Take 1 tablet (5 mg total) by mouth daily. 30 tablet 3  . aspirin EC 81 MG tablet Take 81 mg by mouth daily.    . BD PEN NEEDLE NANO U/F 32G X 4 MM MISC USE FOUR TIMES DAILY 360 each 3  . Blood Glucose Calibration (ACCU-CHEK SMARTVIEW CONTROL) LIQD 1 each by Other route as needed. 1 each 2  . Blood Glucose Monitoring Suppl (ACCU-CHEK NANO SMARTVIEW) W/DEVICE KIT 1 each by Does not apply route daily. Dx: E10.65 1 kit 0  . Cholecalciferol (D 5000) 5000 UNITS capsule Take 5,000 Units by mouth daily.    . clobetasol cream (TEMOVATE) 3.24 % Apply 1 application topically daily.    . cloNIDine (CATAPRES) 0.3 MG tablet TAKE 1 TABLET BY MOUTH EVERY EIGHT HOURS. ONCE AT 8AM, 4PM, AND 12 MIDNIGHT. 90 tablet 3  . diclofenac sodium (VOLTAREN) 1 % GEL Apply 2 g topically daily as needed (Pain). 100 g 0  .  dicyclomine (BENTYL) 10 MG capsule TAKE 1 CAPSULE BY M OUTH THREE TIMES DAILY BEFORE MEALS. 90 capsule 3  . glucose blood (ACCU-CHEK SMARTVIEW) test strip Use to test blood sugar 4 times daily as instructed. Dx: E10.65 375 each 1  . HYDROcodone-acetaminophen (NORCO/VICODIN) 5-325 MG tablet Take 1 tablet by mouth daily as needed for moderate pain. X 5 days 5 tablet 0  . hydroxychloroquine (PLAQUENIL) 200 MG tablet Take 200 mg by mouth daily.    . insulin aspart (NOVOLOG FLEXPEN) 100 UNIT/ML FlexPen Inject 14-18 Units into the skin 3 (three) times daily with meals. 30 mL 1   . Insulin Glargine (TOUJEO SOLOSTAR) 300 UNIT/ML SOPN Inject 35 Units into the skin at bedtime. 6 pen 1  . lamoTRIgine (LAMICTAL) 100 MG tablet TAKE 1 TABLET BY MOUTH TWICE DAILY. 60 tablet 5  . levothyroxine (SYNTHROID, LEVOTHROID) 50 MCG tablet TAKE 1 TABLET BY MOUTH ONCE DAILY AND 1/2 TABLET ON SUNDAYS. (Patient taking differently: Take 25-50 mcg by mouth daily. TAKE 1 TABLET BY MOUTH ONCE DAILY AND 1/2 TABLET ON SUNDAYS.) 30 tablet 2  . LORazepam (ATIVAN) 0.5 MG tablet Take 1 tablet (0.5 mg total) by mouth 3 (three) times daily. 90 tablet 2  . losartan (COZAAR) 100 MG tablet TAKE 1 TABLET BY MOUTH ONCE DAILY. 30 tablet 3  . methocarbamol (ROBAXIN) 500 MG tablet Take 500 mg by mouth daily.    . metoprolol (LOPRESSOR) 50 MG tablet Take 1 tablet (50 mg total) by mouth 2 (two) times daily. NEED OV. 60 tablet 0  . montelukast (SINGULAIR) 10 MG tablet TAKE ONE TABLET BY MOUTH ONCE DAILY. 30 tablet 3  . Multiple Vitamins-Minerals (ONE-A-DAY 50 PLUS PO) Take 1 tablet by mouth daily.    [name] Nystatin (NYAMYC) 100000 UNIT/GM POWD APPLY TO AFFECTED AREA 4 TIMES DAILY. 30 g 2  . polyethylene glycol powder (MIRALAX) powder Take 17 g by mouth daily. To prevent constipation 255 g prn  . pregabalin (LYRICA) 75 MG capsule Take 75 mg by mouth 2 (two) times daily.     . RABEprazole (ACIPHEX) 20 MG tablet TAKE 1 TABLET BY MOUTH TWICE DAILY. 60 tablet 1  . RESTASIS 0.05 % ophthalmic emulsion Place 1 drop into both eyes 2 (two) times daily.     . risperiDONE (RISPERDAL) 0.5 MG tablet Take 1 tablet (0.5 mg total) by mouth at bedtime. 30 tablet 2  . rosuvastatin (CRESTOR) 5 MG tablet TAKE 1 TABLET BY MOUTH AT BEDTIME. 30 tablet 4  . sertraline (ZOLOFT) 100 MG tablet Take 2 tablets (200 mg total) by mouth daily. 60 tablet 2  . spironolactone (ALDACTONE) 25 MG tablet TAKE ONE TABLET BY MOUTH DAILY. 30 tablet 3  . traZODone (DESYREL) 150 MG tablet Take 2 tablets (300 mg total) by mouth at bedtime. 60 tablet 2  . vitamin  E 400 UNIT capsule Take 400 Units by mouth daily.     No current facility-administered medications for this visit.    Functional Status:   In your present state of health, do you have any difficulty performing the following activities: 04/21/2015 03/19/2015  Hearing? N N  Vision? N Y  Difficulty concentrating or making decisions? Y N  Walking or climbing stairs? Y Y  Dressing or bathing? Y Y  Doing errands, shopping? [place]pie Donning    Fall/Depression Screening:    Fall Risk  04/21/2015 03/19/2015 02/10/2015 02/03/2015 01/05/2015  Falls in the past year? Yes Yes Yes Yes Yes  Number falls in past yr: 2  or more 2 or more 2 or more 2 or more 2 or more  Injury with Fall? Yes Yes Yes Yes Yes  Risk Factor Category  High Fall Risk High Fall Risk High Fall Risk High Fall Risk High Fall Risk  Risk for fall due to : History of fall(s) History of fall(s);Impaired balance/gait;Impaired mobility Impaired mobility;Impaired balance/gait - -  Follow up - Falls evaluation completed Falls prevention discussed Falls evaluation completed -   PHQ 2/9 Scores 04/21/2015 03/19/2015 02/10/2015  PHQ - 2 Score 1 2 0  PHQ- 9 Score - 9 -    Assessment:   Diabetes: elevated CBG averages,reviewed treatment plan as given by endocrinologist and reviewed high and low CBG reading tx COPD: being followed by pulmonologist, no new symptoms this visit PAIN: patient saw MD and had xrays, pending renal u/s    Plan:  [facility] CM Care Plan Problem One        Most Recent Value   Care Plan Problem One  Diabetes-knowledge deficit    Role Documenting the Problem One  Care Management Coordinator   Care Plan for Problem One  Not Active   THN CM Short Term Goal #1 (0-30 days)  Patient will monitor and record CBGs on her log and take to endocrinologist appoointments    THN CM Short Term Goal #1 Start Date  05/19/15   Interventions for Short Term Goal #1  Using teachback reviewed logs and self treatment of high and low blood sugar readings    THN CM  Care Plan Problem Two        Most Recent Value   Care Plan Problem Two  High risk for fall as evidenced by falls over past year   Role Documenting the Problem Two  Care Management [place] for Problem Two  Active   Interventions for Problem Two Long Term Goal   no new falls this visit will assess at next call for goal met    [facility] Long Term Goal (31-90) days  Patient will not have any falls over the next 31 days   THN Long Term Goal Start Date  04/21/15   THN CM Short Term Goal #1 (0-30 days)  Patient will continue with her PT and OT services over the nex 21 days   THN CM Short Term Goal #1 Start Date  05/19/15 [restart]   Interventions for Short Term Goal #2   Patient reports PT plans to restart.       Patient will be moving to new apartment Patient will keep upcoming appointments  Patient will call Endocrinologist if her blood sugars increase as result of steroid injection she received today, for new treatment plan if indicated   Plan to call patient next month for discharge call [facility] E. Laymond Purser, RN, BSN, [place] [phone]

## 2015-05-19 NOTE — Progress Notes (Signed)
Subjective:    Patient ID: [name], female    DOB: 06-30-1950, 65 y.o.   MRN: [id number]  HPI   [name]     MRN: [id number]      DOB: 1950/06/01   HPI [name] is here for follow up and re-evaluation of chronic medical conditions, medication management and review of any available recent lab and radiology data.  Preventive health is updated, specifically  Cancer screening and Immunization.   Questions or concerns regarding consultations or procedures which the PT has had in the interim are  addressed. The PT denies any adverse reactions to current medications since the last visit.  C/o increased left flank pain also uncontrolled back pain and left lower extremity pain  ROS Denies recent fever or chills. Denies sinus pressure, nasal congestion, ear pain or sore throat. Denies chest congestion, productive cough or wheezing. Denies chest pains, palpitations and leg swelling Denies abdominal pain, nausea, vomiting,diarrhea or constipation.   Denies dysuria, frequency, hesitancy or incontinence. C/o increased  joint pain, swelling and limitation in mobility. Denies headaches, seizures, numbness, or tingling. Denies depression, anxiety or insomnia. Denies skin break down or rash.   PE  BP 126/70 mmHg  Pulse 70  Temp(Src) 98.9 F (37.2 C)  Resp 18  Ht 5\' 1"  (1.549 m)  Wt 159 lb (72.122 kg)  BMI 30.06 kg/m2  SpO2 98%  LMP  (Exact Date)  Patient alert and oriented and in no cardiopulmonary distress.  HEENT: No facial asymmetry, EOMI,   oropharynx pink and moist.  Neck supple no JVD, no mass.  Chest: Clear to auscultation bilaterally.  CVS: S1, S2 no murmurs, no S3.Regular rate.  ABD: Soft non tender. Mild left renal angle tenderness  Ext: No edema  MS: Decreased  ROM spine, shoulders, hips and knees.  Skin: Intact, no ulcerations or rash noted.  Psych: Good eye contact, normal affect. Memory intact not anxious or depressed appearing.  CNS: CN 2-12 intact,  power,  normal throughout.no focal deficits noted.   Assessment & Plan   Left-sided low back pain with left-sided sciatica hUncontrolled.Toradol and depo medrol administered IM in the office , Return to pain management, referral entered, short course of hydrocodone prescribed and she is not to take any other pain med wit  this  Left flank pain Left flank pain with hypertension, will order renal US, also stool tested for hidden blood and is negative In house UA negative for blood, likely referred pain from back, will f/u  Renal US  Essential hypertension UnControlled, no change in medication DASH diet and commitment to daily physical activity for a minimum of 30 minutes discussed and encouraged, as a part of hypertension management. The importance of attaining a healthy weight is also discussed.  BP/Weight 06/23/2015 06/23/2015 05/31/2015 05/19/2015 05/19/2015 05/10/2015 Q000111Q  Systolic BP [id number] [id number] 123XX123 [id number]  Diastolic BP 90 66 56 70 60 60 60  Wt. (Lbs) 154 - 159 159 159 157 157  BMI 29.11 - 30.06 30.06 30.06 29.68 29.68  Some encounter information is confidential and restricted. Go to Review Flowsheets activity to see all data.        Poorly controlled type 2 diabetes mellitus with circulatory disorder ([place]) Managed by endo [name] is reminded of the importance of commitment to daily physical activity for 30 minutes or more, as able and the need to limit carbohydrate intake to 30 to 60 grams per meal to help with blood sugar control.  The need to take medication as prescribed, test blood sugar as directed, and to call between visits if there is a concern that blood sugar is uncontrolled is also discussed.   [name] is reminded of the importance of daily foot exam, annual eye examination, and good blood sugar, blood pressure and cholesterol control.  Diabetic Labs Latest Ref Rng 06/23/2015 05/31/2015 05/01/2015 03/02/2015 02/22/2015  HbA1c 4.8 - 5.6 % - 6.2(H) - - -    Microalbumin <2.0 mg/dL - - - - -  Micro/Creat Ratio 0.0 - 30.0 mg/g - - - - -  Chol 0 - 200 mg/dL - - - - -  HDL >40 mg/dL - - - - -  Calc LDL 0 - 99 mg/dL - - - - -  Triglycerides <150 mg/dL - - - - -  Creatinine 0.44 - 1.00 mg/dL 0.94 0.90 1.02(H) 1.23(H) 1.16(H)  GFR >60.00 mL/min - - - - -   BP/Weight 06/23/2015 06/23/2015 05/31/2015 05/19/2015 05/19/2015 05/10/2015 Q000111Q  Systolic BP [id number] [id number] 123XX123 [id number]  Diastolic BP 90 66 56 70 60 60 60  Wt. (Lbs) 154 - 159 159 159 157 157  BMI 29.11 - 30.06 30.06 30.06 29.68 29.68  Some encounter information is confidential and restricted. Go to Review Flowsheets activity to see all data.   Foot/eye exam completion dates Latest Ref Rng 10/06/2014 10/03/2013  Eye Exam No Retinopathy No Retinopathy No Retinopathy  Foot exam Order - - -  Foot Form Completion - - -         Hypothyroidism Managed by endo  Light cigarette smoker (1-9 cigarettes per day) Patient counseled for approximately 5 minutes regarding the health risks of ongoing nicotine use, specifically all types of cancer, heart disease, stroke and respiratory failure. The options available for help with cessation ,the behavioral changes to assist the process, and the option to either gradully reduce usage  Or abruptly stop.is also discussed. Pt is also encouraged to set specific goals in number of cigarettes used daily, as well as to set a quit date.  Number of cigarettes/cigars currently smoking daily:5        Review of Systems     Objective:   Physical Exam        Assessment & Plan:

## 2015-05-20 ENCOUNTER — Telehealth: Payer: Self-pay | Admitting: Family Medicine

## 2015-05-20 ENCOUNTER — Telehealth: Payer: Self-pay

## 2015-05-20 DIAGNOSIS — R260 Ataxic gait: Secondary | ICD-10-CM | POA: Diagnosis not present

## 2015-05-20 DIAGNOSIS — F250 Schizoaffective disorder, bipolar type: Secondary | ICD-10-CM | POA: Diagnosis not present

## 2015-05-20 DIAGNOSIS — I69354 Hemiplegia and hemiparesis following cerebral infarction affecting left non-dominant side: Secondary | ICD-10-CM | POA: Diagnosis not present

## 2015-05-20 DIAGNOSIS — I10 Essential (primary) hypertension: Secondary | ICD-10-CM | POA: Diagnosis not present

## 2015-05-20 DIAGNOSIS — E119 Type 2 diabetes mellitus without complications: Secondary | ICD-10-CM | POA: Diagnosis not present

## 2015-05-20 DIAGNOSIS — Z794 Long term (current) use of insulin: Secondary | ICD-10-CM | POA: Diagnosis not present

## 2015-05-20 DIAGNOSIS — F17210 Nicotine dependence, cigarettes, uncomplicated: Secondary | ICD-10-CM | POA: Diagnosis not present

## 2015-05-20 DIAGNOSIS — J440 Chronic obstructive pulmonary disease with acute lower respiratory infection: Secondary | ICD-10-CM | POA: Diagnosis not present

## 2015-05-20 DIAGNOSIS — M5010 Cervical disc disorder with radiculopathy, unspecified cervical region: Secondary | ICD-10-CM | POA: Diagnosis not present

## 2015-05-20 NOTE — Telephone Encounter (Signed)
[name] needs a call back from the nurse regarding [name] Medications

## 2015-05-20 NOTE — Telephone Encounter (Signed)
Patient received losartan and spironolactone from [facility] today and nurse states they were d'c'd at her hospital stay in Dec and I seen that in the notes but they were not d'c'd from her med list and got refilled. Told nurse if she had not been on them since Dec to d/c them and I would sent a note to [facility] making them aware. Note faxed to [facility]

## 2015-05-20 NOTE — Telephone Encounter (Signed)
yes

## 2015-05-20 NOTE — Telephone Encounter (Signed)
Called and left message for patient notifying.  

## 2015-05-21 ENCOUNTER — Ambulatory Visit (HOSPITAL_COMMUNITY)
Admission: RE | Admit: 2015-05-21 | Discharge: 2015-05-21 | Disposition: A | Discharge status: Home / Self Care | Payer: Commercial Managed Care - HMO | Source: Ambulatory Visit | Attending: Family Medicine | Admitting: Family Medicine

## 2015-05-21 DIAGNOSIS — I10 Essential (primary) hypertension: Secondary | ICD-10-CM | POA: Diagnosis not present

## 2015-05-21 DIAGNOSIS — R109 Unspecified abdominal pain: Secondary | ICD-10-CM | POA: Diagnosis not present

## 2015-05-21 DIAGNOSIS — N261 Atrophy of kidney (terminal): Secondary | ICD-10-CM | POA: Diagnosis not present

## 2015-05-24 ENCOUNTER — Telehealth: Payer: Self-pay | Admitting: Family Medicine

## 2015-05-24 ENCOUNTER — Telehealth (HOSPITAL_COMMUNITY): Payer: Self-pay | Admitting: *Deleted

## 2015-05-24 NOTE — Telephone Encounter (Signed)
[name] is calling asking about test results, please advise?

## 2015-05-24 NOTE — Telephone Encounter (Signed)
patient called, she need a refill of Ativan. She is out of this medication.   She uses [name].

## 2015-05-25 ENCOUNTER — Telehealth (HOSPITAL_COMMUNITY): Payer: Self-pay | Admitting: *Deleted

## 2015-05-25 ENCOUNTER — Encounter: Payer: Commercial Managed Care - HMO | Admitting: Obstetrics and Gynecology

## 2015-05-25 IMAGING — CR DG CHEST 2V
2 series · 2 of 2 positions shown · non-contrast
Comparison: 09/15/2013

CLINICAL DATA: Cough, wheezing, and shortness of breath for 4
weeks, asthma with bronchitis, history hypertension, asthma, former
smoker, diabetes, GERD, stroke

EXAM:
CHEST  2 VIEW

[view not recorded (1 of 2)]
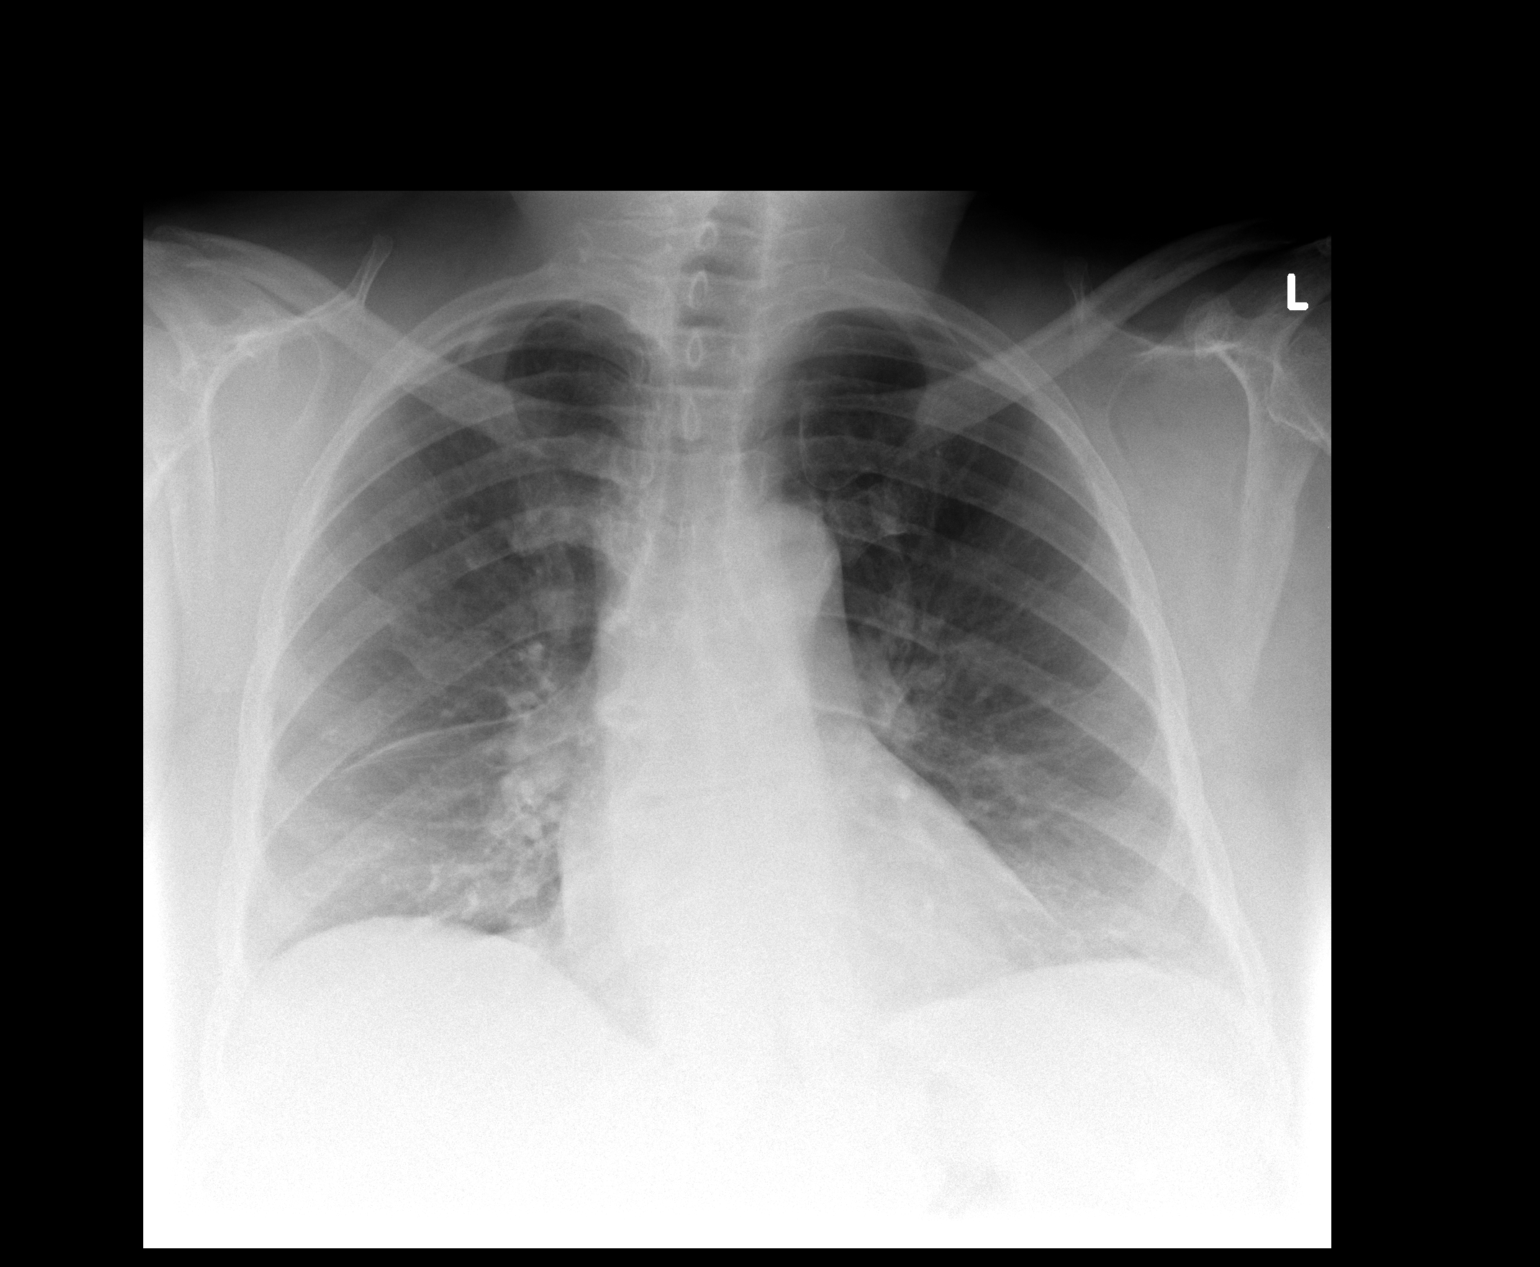

[view not recorded (2 of 2)]
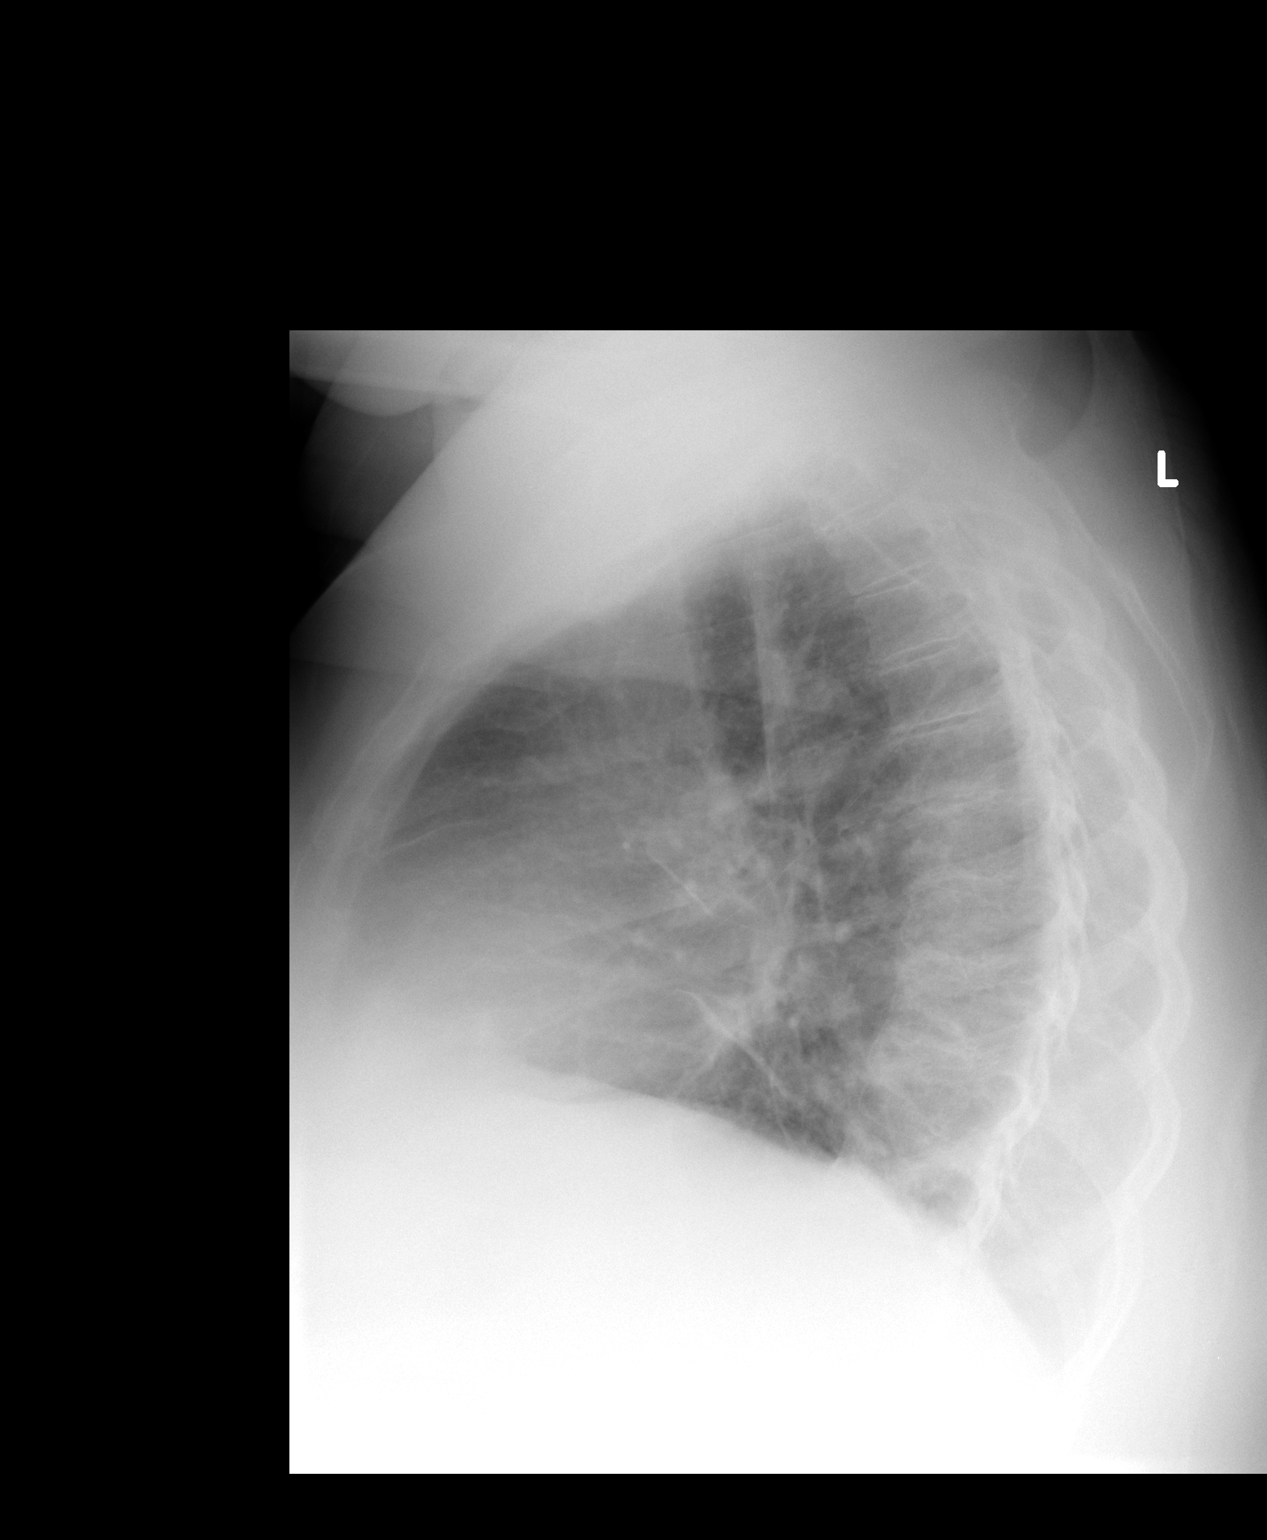

[2 of 2 positions shown; findings below may reference images not displayed]

FINDINGS: Upper normal heart size.

Normal mediastinal contours and pulmonary vascularity.

Chronic atelectasis at RIGHT lower lobe.

Lungs otherwise clear.

No pleural effusion or pneumothorax.

Bones unremarkable.
IMPRESSION: Chronic RIGHT basilar atelectasis.

## 2015-05-25 MED ORDER — LORAZEPAM 0.5 MG PO TABS: 0.5000 mg | ORAL_TABLET | Freq: Three times a day (TID) | ORAL | Status: DC

## 2015-05-25 NOTE — Telephone Encounter (Signed)
Per Dr Harrington Challenger to call pt pharmacy and call in 30 days supply for pt Ativan. Pt is aware. Called pt pharmacy and spoke with April and she showed understanding.

## 2015-05-25 NOTE — Telephone Encounter (Signed)
Pt called stating she is out of her Ativan. Pt medication last printed on 02-02-15 with 90 tabs 2 refills. Pt f/u appt is sch for 06-22-15. Pt pharmacy is [name]. Pt number is [phone].

## 2015-05-25 NOTE — Telephone Encounter (Signed)
You may call in one month supply 

## 2015-05-25 NOTE — Telephone Encounter (Signed)
Called in pt medication  And spoke with April

## 2015-05-26 NOTE — Telephone Encounter (Signed)
Patient aware of results.

## 2015-05-27 ENCOUNTER — Encounter (HOSPITAL_COMMUNITY): Payer: Self-pay | Admitting: *Deleted

## 2015-05-27 DIAGNOSIS — F17210 Nicotine dependence, cigarettes, uncomplicated: Secondary | ICD-10-CM | POA: Diagnosis not present

## 2015-05-27 DIAGNOSIS — M5010 Cervical disc disorder with radiculopathy, unspecified cervical region: Secondary | ICD-10-CM | POA: Diagnosis not present

## 2015-05-27 DIAGNOSIS — J440 Chronic obstructive pulmonary disease with acute lower respiratory infection: Secondary | ICD-10-CM | POA: Diagnosis not present

## 2015-05-27 DIAGNOSIS — Z794 Long term (current) use of insulin: Secondary | ICD-10-CM | POA: Diagnosis not present

## 2015-05-27 DIAGNOSIS — R260 Ataxic gait: Secondary | ICD-10-CM | POA: Diagnosis not present

## 2015-05-27 DIAGNOSIS — I69354 Hemiplegia and hemiparesis following cerebral infarction affecting left non-dominant side: Secondary | ICD-10-CM | POA: Diagnosis not present

## 2015-05-27 DIAGNOSIS — E119 Type 2 diabetes mellitus without complications: Secondary | ICD-10-CM | POA: Diagnosis not present

## 2015-05-27 DIAGNOSIS — I10 Essential (primary) hypertension: Secondary | ICD-10-CM | POA: Diagnosis not present

## 2015-05-27 DIAGNOSIS — F250 Schizoaffective disorder, bipolar type: Secondary | ICD-10-CM | POA: Diagnosis not present

## 2015-05-27 NOTE — Progress Notes (Signed)
Pt instructed that the night before surgery to only take 28 units of Toujeo. If morning of surgery when she gets up if sugar is > 220 then take 1/2 of usual dosage correction. If below 70 may have a half cup of cranberry or apple juice.Verbalized understanding.

## 2015-05-31 ENCOUNTER — Encounter (HOSPITAL_COMMUNITY): Payer: Self-pay | Admitting: Certified Registered Nurse Anesthetist

## 2015-05-31 ENCOUNTER — Encounter (HOSPITAL_COMMUNITY)
Admission: RE | Disposition: A | Discharge status: Home / Self Care | Payer: Self-pay | Source: Ambulatory Visit | Attending: Oral Surgery

## 2015-05-31 ENCOUNTER — Ambulatory Visit (HOSPITAL_COMMUNITY)
Admission: RE | Admit: 2015-05-31 | Discharge: 2015-05-31 | Disposition: A | Discharge status: Home / Self Care | Payer: Commercial Managed Care - HMO | Source: Ambulatory Visit | Attending: Oral Surgery | Admitting: Oral Surgery

## 2015-05-31 ENCOUNTER — Ambulatory Visit (HOSPITAL_COMMUNITY): Payer: Commercial Managed Care - HMO | Admitting: Certified Registered Nurse Anesthetist

## 2015-05-31 DIAGNOSIS — E785 Hyperlipidemia, unspecified: Secondary | ICD-10-CM | POA: Insufficient documentation

## 2015-05-31 DIAGNOSIS — I69354 Hemiplegia and hemiparesis following cerebral infarction affecting left non-dominant side: Secondary | ICD-10-CM | POA: Insufficient documentation

## 2015-05-31 DIAGNOSIS — L280 Lichen simplex chronicus: Secondary | ICD-10-CM | POA: Diagnosis not present

## 2015-05-31 DIAGNOSIS — M1990 Unspecified osteoarthritis, unspecified site: Secondary | ICD-10-CM | POA: Diagnosis not present

## 2015-05-31 DIAGNOSIS — G4700 Insomnia, unspecified: Secondary | ICD-10-CM | POA: Diagnosis not present

## 2015-05-31 DIAGNOSIS — K219 Gastro-esophageal reflux disease without esophagitis: Secondary | ICD-10-CM | POA: Insufficient documentation

## 2015-05-31 DIAGNOSIS — I10 Essential (primary) hypertension: Secondary | ICD-10-CM | POA: Insufficient documentation

## 2015-05-31 DIAGNOSIS — Z9989 Dependence on other enabling machines and devices: Secondary | ICD-10-CM | POA: Insufficient documentation

## 2015-05-31 DIAGNOSIS — M542 Cervicalgia: Secondary | ICD-10-CM | POA: Diagnosis not present

## 2015-05-31 DIAGNOSIS — E039 Hypothyroidism, unspecified: Secondary | ICD-10-CM | POA: Diagnosis not present

## 2015-05-31 DIAGNOSIS — J449 Chronic obstructive pulmonary disease, unspecified: Secondary | ICD-10-CM | POA: Diagnosis not present

## 2015-05-31 DIAGNOSIS — F319 Bipolar disorder, unspecified: Secondary | ICD-10-CM | POA: Diagnosis not present

## 2015-05-31 DIAGNOSIS — G4730 Sleep apnea, unspecified: Secondary | ICD-10-CM | POA: Diagnosis not present

## 2015-05-31 DIAGNOSIS — Z7984 Long term (current) use of oral hypoglycemic drugs: Secondary | ICD-10-CM | POA: Diagnosis not present

## 2015-05-31 DIAGNOSIS — R569 Unspecified convulsions: Secondary | ICD-10-CM | POA: Insufficient documentation

## 2015-05-31 DIAGNOSIS — K1379 Other lesions of oral mucosa: Secondary | ICD-10-CM | POA: Diagnosis not present

## 2015-05-31 DIAGNOSIS — F419 Anxiety disorder, unspecified: Secondary | ICD-10-CM | POA: Insufficient documentation

## 2015-05-31 DIAGNOSIS — E119 Type 2 diabetes mellitus without complications: Secondary | ICD-10-CM | POA: Insufficient documentation

## 2015-05-31 DIAGNOSIS — K1321 Leukoplakia of oral mucosa, including tongue: Secondary | ICD-10-CM | POA: Diagnosis not present

## 2015-05-31 DIAGNOSIS — M549 Dorsalgia, unspecified: Secondary | ICD-10-CM | POA: Diagnosis not present

## 2015-05-31 DIAGNOSIS — M278 Other specified diseases of jaws: Secondary | ICD-10-CM | POA: Diagnosis present

## 2015-05-31 HISTORY — DX: Hyperlipidemia, unspecified: E78.5

## 2015-05-31 HISTORY — PX: LESION REMOVAL: SHX5196

## 2015-05-31 HISTORY — DX: Insomnia, unspecified: G47.00

## 2015-05-31 LAB — BASIC METABOLIC PANEL
ANION GAP: 9 (ref 5–15)
BUN: 22 mg/dL — ABNORMAL HIGH (ref 6–20)
CALCIUM: 9.3 mg/dL (ref 8.9–10.3)
CO2: 24 mmol/L (ref 22–32)
Chloride: 112 mmol/L — ABNORMAL HIGH (ref 101–111)
Creatinine, Ser: 0.9 mg/dL (ref 0.44–1.00)
GLUCOSE: 86 mg/dL (ref 65–99)
POTASSIUM: 3.7 mmol/L (ref 3.5–5.1)
SODIUM: 145 mmol/L (ref 135–145)

## 2015-05-31 LAB — GLUCOSE, CAPILLARY
GLUCOSE-CAPILLARY: 86 mg/dL (ref 65–99)
GLUCOSE-CAPILLARY: 86 mg/dL (ref 65–99)
Glucose-Capillary: 70 mg/dL (ref 65–99)
Glucose-Capillary: 91 mg/dL (ref 65–99)

## 2015-05-31 LAB — CBC
HEMATOCRIT: 36.5 % (ref 36.0–46.0)
HEMOGLOBIN: 11 g/dL — AB (ref 12.0–15.0)
MCH: 22.6 pg — ABNORMAL LOW (ref 26.0–34.0)
MCHC: 30.1 g/dL (ref 30.0–36.0)
MCV: 75.1 fL — ABNORMAL LOW (ref 78.0–100.0)
Platelets: 242 10*3/uL (ref 150–400)
RBC: 4.86 MIL/uL (ref 3.87–5.11)
RDW: 14.2 % (ref 11.5–15.5)
WBC: 6.8 10*3/uL (ref 4.0–10.5)

## 2015-05-31 SURGERY — WIDE EXCISION, LESION, UPPER EXTREMITY
Anesthesia: General | Site: Mouth

## 2015-05-31 MED ORDER — LACTATED RINGERS IV SOLN
INTRAVENOUS | Status: DC | PRN
  Administered 2015-05-31: 07:00:00 via INTRAVENOUS

## 2015-05-31 MED ORDER — SUGAMMADEX SODIUM 200 MG/2ML IV SOLN
INTRAVENOUS | Status: AC
  Filled 2015-05-31: qty 2

## 2015-05-31 MED ORDER — LIDOCAINE-EPINEPHRINE 2 %-1:100000 IJ SOLN
INTRAMUSCULAR | Status: AC
  Filled 2015-05-31: qty 1

## 2015-05-31 MED ORDER — OXYMETAZOLINE HCL 0.05 % NA SOLN
NASAL | Status: AC
  Administered 2015-05-31: 2 via NASAL
  Filled 2015-05-31: qty 15

## 2015-05-31 MED ORDER — FLUMAZENIL 0.5 MG/5ML IV SOLN
INTRAVENOUS | Status: DC | PRN
  Administered 2015-05-31: 0.2 mg via INTRAVENOUS

## 2015-05-31 MED ORDER — BACITRACIN-NEOMYCIN-POLYMYXIN 400-5-5000 EX OINT
TOPICAL_OINTMENT | CUTANEOUS | Status: AC
  Filled 2015-05-31: qty 1

## 2015-05-31 MED ORDER — LIDOCAINE-EPINEPHRINE 2 %-1:100000 IJ SOLN
INTRAMUSCULAR | Status: DC | PRN
  Administered 2015-05-31: 11 mL

## 2015-05-31 MED ORDER — SUGAMMADEX SODIUM 200 MG/2ML IV SOLN
INTRAVENOUS | Status: DC | PRN
  Administered 2015-05-31: 200 mg via INTRAVENOUS

## 2015-05-31 MED ORDER — HYDROCODONE-ACETAMINOPHEN 5-325 MG PO TABS
1.0000 | ORAL_TABLET | Freq: Four times a day (QID) | ORAL | Status: DC | PRN
Start: 2015-05-31 — End: 2015-08-31

## 2015-05-31 MED ORDER — CLINDAMYCIN PHOSPHATE 600 MG/50ML IV SOLN
600.0000 mg | Freq: Once | INTRAVENOUS | Status: AC
  Administered 2015-05-31: 600 mg via INTRAVENOUS
  Filled 2015-05-31: qty 50

## 2015-05-31 MED ORDER — FENTANYL CITRATE (PF) 250 MCG/5ML IJ SOLN
INTRAMUSCULAR | Status: AC
  Filled 2015-05-31: qty 5

## 2015-05-31 MED ORDER — PHENYLEPHRINE HCL 10 MG/ML IJ SOLN
INTRAMUSCULAR | Status: DC | PRN
  Administered 2015-05-31 (×4): 120 ug via INTRAVENOUS

## 2015-05-31 MED ORDER — PROPOFOL 10 MG/ML IV BOLUS
INTRAVENOUS | Status: DC | PRN
  Administered 2015-05-31: 130 mg via INTRAVENOUS

## 2015-05-31 MED ORDER — ONDANSETRON HCL 4 MG/2ML IJ SOLN
INTRAMUSCULAR | Status: DC | PRN
  Administered 2015-05-31: 4 mg via INTRAVENOUS

## 2015-05-31 MED ORDER — EPHEDRINE SULFATE 50 MG/ML IJ SOLN
INTRAMUSCULAR | Status: DC | PRN
  Administered 2015-05-31: 10 mg via INTRAVENOUS
  Administered 2015-05-31: 5 mg via INTRAVENOUS
  Administered 2015-05-31: 10 mg via INTRAVENOUS

## 2015-05-31 MED ORDER — FENTANYL CITRATE (PF) 100 MCG/2ML IJ SOLN
INTRAMUSCULAR | Status: DC | PRN
  Administered 2015-05-31: 150 ug via INTRAVENOUS

## 2015-05-31 MED ORDER — LIDOCAINE HCL (CARDIAC) 20 MG/ML IV SOLN
INTRAVENOUS | Status: DC | PRN
  Administered 2015-05-31: 100 mg via INTRAVENOUS

## 2015-05-31 MED ORDER — PROPOFOL 10 MG/ML IV BOLUS
INTRAVENOUS | Status: AC
  Filled 2015-05-31: qty 20

## 2015-05-31 MED ORDER — ROCURONIUM BROMIDE 100 MG/10ML IV SOLN
INTRAVENOUS | Status: DC | PRN
  Administered 2015-05-31: 50 mg via INTRAVENOUS

## 2015-05-31 SURGICAL SUPPLY — 43 items
BLADE 10 SAFETY STRL DISP (BLADE) ×3 IMPLANT
BLADE SURG 15 STRL LF DISP TIS (BLADE) ×1 IMPLANT
BLADE SURG 15 STRL SS (BLADE) ×3
BUR CROSS CUT FISSURE 1.6 (BURR) IMPLANT
BUR CROSS CUT FISSURE 1.6MM (BURR)
BUR EGG ELITE 4.0 (BURR) IMPLANT
BUR EGG ELITE 4.0MM (BURR)
BUR FAST CUTTING (BURR)
BUR SRGRND 54.5X2.4X8 (BURR) IMPLANT
BURR SRGRND 54.5X2.4X8 (BURR)
CANISTER SUCT 1200ML W/VALVE (MISCELLANEOUS) IMPLANT
CLOSURE WOUND 1/2 X4 (GAUZE/BANDAGES/DRESSINGS)
COVER MAYO STAND STRL (DRAPES) ×3 IMPLANT
COVER TABLE BACK 60X90 (DRAPES) ×3 IMPLANT
DECANTER SPIKE VIAL GLASS SM (MISCELLANEOUS) IMPLANT
DRAPE U-SHAPE 76X120 STRL (DRAPES) ×3 IMPLANT
GAUZE PACKING FOLDED 2  STR (GAUZE/BANDAGES/DRESSINGS) ×2
GAUZE PACKING FOLDED 2 STR (GAUZE/BANDAGES/DRESSINGS) ×1 IMPLANT
GAUZE PACKING IODOFORM 1/4X15 (GAUZE/BANDAGES/DRESSINGS) IMPLANT
GLOVE BIO SURGEON STRL SZ 6.5 (GLOVE) ×2 IMPLANT
GLOVE BIO SURGEON STRL SZ7.5 (GLOVE) ×3 IMPLANT
GLOVE BIO SURGEONS STRL SZ 6.5 (GLOVE) ×1
GOWN STRL REUS W/ TWL LRG LVL3 (GOWN DISPOSABLE) ×1 IMPLANT
GOWN STRL REUS W/ TWL XL LVL3 (GOWN DISPOSABLE) ×1 IMPLANT
GOWN STRL REUS W/TWL LRG LVL3 (GOWN DISPOSABLE) ×3
GOWN STRL REUS W/TWL XL LVL3 (GOWN DISPOSABLE) ×3
IV NS 500ML (IV SOLUTION)
IV NS 500ML BAXH (IV SOLUTION) IMPLANT
KIT BASIN OR (CUSTOM PROCEDURE TRAY) ×3 IMPLANT
NEEDLE HYPO 22GX1.5 SAFETY (NEEDLE) ×3 IMPLANT
NS IRRIG 1000ML POUR BTL (IV SOLUTION) ×3 IMPLANT
SPONGE SURGIFOAM ABS GEL 12-7 (HEMOSTASIS) IMPLANT
STRIP CLOSURE SKIN 1/2X4 (GAUZE/BANDAGES/DRESSINGS) IMPLANT
SUT CHROMIC 3 0 PS 2 (SUTURE) IMPLANT
SYR 20CC LL (SYRINGE) IMPLANT
SYR BULB 3OZ (MISCELLANEOUS) IMPLANT
SYR CONTROL 10ML LL (SYRINGE) ×3 IMPLANT
TOOTHBRUSH ADULT (PERSONAL CARE ITEMS) IMPLANT
TOWEL OR 17X24 6PK STRL BLUE (TOWEL DISPOSABLE) ×3 IMPLANT
TOWEL OR NON WOVEN STRL DISP B (DISPOSABLE) ×3 IMPLANT
TUBE CONNECTING 20'X1/4 (TUBING) ×1
TUBE CONNECTING 20X1/4 (TUBING) ×2 IMPLANT
YANKAUER SUCT BULB TIP NO VENT (SUCTIONS) ×3 IMPLANT

## 2015-05-31 NOTE — Transfer of Care (Signed)
Immediate Anesthesia Transfer of Care Note  Patient: [name]  Procedure(s) Performed: Procedure(s): REMOVAL RIGHT AND LEFT LESIONS OF MANDIBLE (N/A)  Patient Location: PACU  Anesthesia Type:General  Level of Consciousness: awake, alert , oriented and patient cooperative  Airway & Oxygen Therapy: Patient Spontanous Breathing and Patient connected to face mask oxygen  Post-op Assessment: Report given to RN, Post -op Vital signs reviewed and stable and Patient moving all extremities  Post vital signs: Reviewed and stable  Last Vitals:  Filed Vitals:   05/31/15 0630  BP: 122/55  Pulse: 66  Temp: 36.8 C  Resp: 20    Complications: No apparent anesthesia complications

## 2015-05-31 NOTE — Op Note (Signed)
05/31/2015  8:08 AM  PATIENT:  [name]  65 y.o. female  PRE-OPERATIVE DIAGNOSIS:  Lesions right and left mandible  POST-OPERATIVE DIAGNOSIS:  SAME  PROCEDURE:  Procedure(s): REMOVAL RIGHT AND LEFT LESIONS OF MANDIBLE  SURGEON:  Surgeon(s): Diona Browner, DDS  ANESTHESIA:   local and general  EBL:  minimal  DRAINS: none   SPECIMEN:  1. Lesion left mandible 2. Lesion anterior mandible 3. Lesion right mandible  COUNTS:  YES  PLAN OF CARE: Discharge to home after PACU  PATIENT DISPOSITION:  PACU - hemodynamically stable.   PROCEDURE DETAILS: Dictation #  Gae Bon, DMD 05/31/2015 8:08 AM

## 2015-05-31 NOTE — Anesthesia Procedure Notes (Signed)
Procedure Name: Intubation Date/Time: 05/31/2015 7:42 AM Performed by: Shirlyn Goltz Pre-anesthesia Checklist: Patient identified, Emergency Drugs available, Suction available and Patient being monitored Patient Re-evaluated:Patient Re-evaluated prior to inductionOxygen Delivery Method: Circle system utilized Preoxygenation: Pre-oxygenation with 100% oxygen Intubation Type: IV induction Ventilation: Mask ventilation without difficulty Laryngoscope Size: Mac and 3 Grade View: Grade I Nasal Tubes: Nasal Rae, Right and Magill forceps - small, utilized Tube size: 7.0 mm Number of attempts: 1 Airway Equipment and Method: Stylet Placement Confirmation: ETT inserted through vocal cords under direct vision,  positive ETCO2 and breath sounds checked- equal and bilateral Tube secured with: Tape Dental Injury: Teeth and Oropharynx as per pre-operative assessment

## 2015-05-31 NOTE — Anesthesia Preprocedure Evaluation (Signed)
Anesthesia Evaluation  Patient identified by MRN, date of birth, ID band Patient awake    Reviewed: Allergy & Precautions, NPO status , Patient's Chart, lab work & pertinent test results  Airway Mallampati: I  TM Distance: >3 FB Neck ROM: Full    Dental   Pulmonary sleep apnea and Continuous Positive Airway Pressure Ventilation , Current Smoker,    Pulmonary exam normal        Cardiovascular hypertension, Pt. on medications Normal cardiovascular exam     Neuro/Psych Anxiety Depression Bipolar Disorder    GI/Hepatic GERD  Medicated and Controlled,  Endo/Other  diabetes, Type 2, Oral Hypoglycemic AgentsHypothyroidism   Renal/GU      Musculoskeletal   Abdominal   Peds  Hematology   Anesthesia Other Findings   Reproductive/Obstetrics                             Anesthesia Physical Anesthesia Plan  ASA: III  Anesthesia Plan: General   Post-op Pain Management:    Induction: Intravenous  Airway Management Planned: Nasal ETT  Additional Equipment:   Intra-op Plan:   Post-operative Plan: Extubation in OR  Informed Consent: I have reviewed the patients History and Physical, chart, labs and discussed the procedure including the risks, benefits and alternatives for the proposed anesthesia with the patient or authorized representative who has indicated his/her understanding and acceptance.     Plan Discussed with: CRNA and Surgeon  Anesthesia Plan Comments:         Anesthesia Quick Evaluation

## 2015-05-31 NOTE — H&P (Signed)
HISTORY AND PHYSICAL  [name] is a 65 y.o. female patient with CC: painful oral lesions.  HPI: Patient underwent skin graft to mandible previously and now has callous-like lesions of the mandible.  No diagnosis found.  Past Medical History  Diagnosis Date  . Pancreatitis 2006    due to Depakote with normal EUS   . Osteoporosis   . Chronic back pain   . Migraines     chronic headaches  . Diverticulosis     TCS 9/08 by Dr. Delfin Edis for diarrhea . Bx for micro scopic colitis negative.   . Schatzki's ring     non critical / EGD with ED 8/2011with RMR  . Glaucoma     eye drops daily  . Anemia   . Blood transfusion   . Stroke [facility])     left sided weakness  . Metabolic encephalopathy [id number]  . Sleep apnea     on CPAP  . Arthritis   . Gum symptoms     infection on antibiotic  . Chronic neck pain   . Mononeuritis lower limb   . Frequent falls   . Diabetes mellitus     Type II  . Complication of anesthesia     "hard to put sleep"  . Cervical disc disorder with radiculopathy of cervical region 10/31/2012  . COPD (chronic obstructive pulmonary disease) with chronic bronchitis ([place]) 09/16/2013    Office Spirometry 10/30/2013-submaximal effort based on appearance of loop and curve. Numbers would fit with severe restriction but her physiologic capability may be better than this. FVC 0.91/44%, and 10.74/45%, FEV1/FVC 0.81, FEF 25-75% 1.43/69%    . Carpal tunnel syndrome of right wrist 05/23/2011  . Hemiplegia affecting non-dominant side, post-stroke ([place]) 08/02/2011  . Hypertension     takes Amlodipine,Metoprolol,and Clonidine daily  . Hyperlipidemia     takes Crestor daily  . Insomnia     takes Trazodone nightly  . Depression     takes Zoloft daily  . Bipolar disorder ([place])     takes Risperdal nightly  . GERD (gastroesophageal reflux disease)     takes Aciphex daily  . Anxiety     takes Ativan daily  . Hypothyroidism     takes Synthroid daily  . Seizures ([place])    takes Lamictal daily.Last seizure 3 yrs ago    No current facility-administered medications for this encounter.   Allergies  Allergen Reactions  . Cephalexin Hives  . Iron Nausea And Vomiting  . Milk-Related Compounds Other (See Comments)    Doesn't agree with stomach.   . Penicillins Hives    Has patient had a PCN reaction causing immediate rash, facial/tongue/throat swelling, SOB or lightheadedness with hypotension: Yes Has patient had a PCN reaction causing severe rash involving mucus membranes or skin necrosis: No Has patient had a PCN reaction that required hospitalization No Has patient had a PCN reaction occurring within the last 10 years: No If all of the above answers are "NO", then may proceed with Cephalosporin use.   [name] Phenazopyridine Hcl Hives         Active Problems:   * No active hospital problems. *  Vitals: Blood pressure 122/55, pulse 66, temperature 98.2 F (36.8 C), temperature source Oral, resp. rate 20, height 5\' 1"  (1.549 m), weight 72.122 kg (159 lb), SpO2 100 %. Lab results: Results for orders placed or performed during the hospital encounter of 05/31/15 (from the past 24 hour(s))  Glucose, capillary     Status: None  Collection Time: 05/31/15  6:29 AM  Result Value Ref Range   Glucose-Capillary 86 65 - 99 mg/dL   Comment 1 Notify RN    Comment 2 Document in Chart   CBC     Status: Abnormal   Collection Time: 05/31/15  6:30 AM  Result Value Ref Range   WBC 6.8 4.0 - 10.5 K/uL   RBC 4.86 3.87 - 5.11 MIL/uL   Hemoglobin 11.0 (L) 12.0 - 15.0 g/dL   HCT 36.5 36.0 - 46.0 %   MCV 75.1 (L) 78.0 - 100.0 fL   MCH 22.6 (L) 26.0 - 34.0 pg   MCHC 30.1 30.0 - 36.0 g/dL   RDW 14.2 11.5 - 15.5 %   Platelets 242 150 - 400 K/uL  Glucose, capillary     Status: None   Collection Time: 05/31/15  7:05 AM  Result Value Ref Range   Glucose-Capillary 86 65 - 99 mg/dL   *Note: Due to a large number of results and/or encounters for the requested time period, some  results have not been displayed. A complete set of results can be found in Results Review.   Radiology Results: No results found. General appearance: alert, cooperative, no distress and moderately obese Head: Normocephalic, without obvious abnormality, atraumatic Eyes: negative Nose: Nares normal. Septum midline. Mucosa normal. No drainage or sinus tenderness. Throat: edentulous maxilla and mandible. multiple sessile pigmented lesions bilateral mandible mucosa. Pharynx clear. Neck: no adenopathy, supple, symmetrical, trachea midline and thyroid not enlarged, symmetric, no tenderness/mass/nodules Resp: clear to auscultation bilaterally Cardio: regular rate and rhythm, S1, S2 normal, no murmur, click, rub or gallop  Assessment: Bilateral mandibular lesions  Plan: Removal lesions bilateral mandible. General anesthesia. day surgery.   Gae Bon 05/31/2015

## 2015-05-31 NOTE — Anesthesia Postprocedure Evaluation (Signed)
Anesthesia Post Note  Patient: [name]  Procedure(s) Performed: Procedure(s) (LRB): REMOVAL RIGHT AND LEFT LESIONS OF MANDIBLE (N/A)  Patient location during evaluation: PACU Anesthesia Type: General Level of consciousness: awake and alert Pain management: pain level controlled Vital Signs Assessment: post-procedure vital signs reviewed and stable Respiratory status: spontaneous breathing, nonlabored ventilation, respiratory function stable and patient connected to nasal cannula oxygen Cardiovascular status: blood pressure returned to baseline and stable Postop Assessment: no signs of nausea or vomiting Anesthetic complications: no    Last Vitals:  Filed Vitals:   05/31/15 0915 05/31/15 0930  BP: 76/44 93/45  Pulse: 58 58  Temp:    Resp: 15 16    Last Pain:  Filed Vitals:   05/31/15 0946  PainSc: 6                  Alfred Eckley DAVID

## 2015-05-31 NOTE — Op Note (Signed)
NAME:  PAGELeylany, Hevey                  ACCOUNT NO.:  [id number]  MEDICAL RECORD NO.:  [id number]  LOCATION:  [facility]                         FACILITY:  [facility]  PHYSICIAN:  Gae Bon, M.D.  DATE OF BIRTH:  1950-10-28  DATE OF PROCEDURE:  05/31/2015 DATE OF DISCHARGE:                              OPERATIVE REPORT   PREOPERATIVE DIAGNOSIS:  Lesions of right and left mandible.  POSTOPERATIVE DIAGNOSIS:  Lesions of right and left mandible.  PROCEDURE:  Removal of lesions of the right and left mandible.  SURGEON:  Gae Bon, M.D.  ANESTHESIA:  General, Assi, attending, nasal intubation.  INDICATIONS FOR PROCEDURE:  The patient is a 65 year old black female, who was seen in the office recently for her complaint of lesions of the right and left mandible, these lesions were painful and interfered with wearing of her denture.  She has a past surgical history significant for split-thickness skin graft from the thigh to the mouth many years ago, I believe in 2010.  She underwent biopsy in the office on May 05, 2015 of one of the lesions of the right mandible and the pathology report was significant for moderate epithelial dysplasia with verrucous hyperkeratosis, therefore, the decision was made to remove the remaining lesions of the mandible.  PROCEDURE:  The patient was taken to the operating room, placed on the table in supine position.  General anesthesia was administered intravenously and a nasal endotracheal tube was placed and secured.  The eyes were protected.  The patient was draped for the procedure.  A time- out was performed.  The posterior pharynx was suctioned.  A throat pack was placed.  A 2% lidocaine with 1:100,000 epinephrine was infiltrated in an inferior alveolar block and in the infiltration around the 3 lesions of the mandible, all were on the lingual aspect of the mandible, one on the left and one on the anterior midline and one on the right.  A sweetheart  retractor was used and a bite block was used to provide access, a #15 blade was used to make an incision beginning on the left side around the lesion, the lesion was excised, and the area was left open to heal by secondary intention.  The #15 blade was then used to remove the lesion of the anterior lingual mandible; and then after switching the sweetheart retractor and bite block, the right lesion was excised as well.  The oral cavity was then irrigated and suctioned and the throat pack was removed.  The surgery being completed.  The patient was left in the hands of the nurse anesthetist.  ESTIMATED BLOOD LOSS:  Minimal.  DRAINS:  None.  SPECIMEN:  Three lesions of left midline and right lingual mandible.  COUNTS:  Correct.     Gae Bon, M.D.     SMJ/MEDQ  D:  05/31/2015  T:  05/31/2015  Job:  [id number]

## 2015-06-01 ENCOUNTER — Encounter (HOSPITAL_COMMUNITY): Payer: Self-pay | Admitting: Oral Surgery

## 2015-06-01 LAB — HEMOGLOBIN A1C
Hgb A1c MFr Bld: 6.2 % — ABNORMAL HIGH (ref 4.8–5.6)
MEAN PLASMA GLUCOSE: 131 mg/dL

## 2015-06-03 DIAGNOSIS — I10 Essential (primary) hypertension: Secondary | ICD-10-CM | POA: Diagnosis not present

## 2015-06-03 DIAGNOSIS — J440 Chronic obstructive pulmonary disease with acute lower respiratory infection: Secondary | ICD-10-CM | POA: Diagnosis not present

## 2015-06-03 DIAGNOSIS — F17210 Nicotine dependence, cigarettes, uncomplicated: Secondary | ICD-10-CM | POA: Diagnosis not present

## 2015-06-03 DIAGNOSIS — E119 Type 2 diabetes mellitus without complications: Secondary | ICD-10-CM | POA: Diagnosis not present

## 2015-06-03 DIAGNOSIS — Z794 Long term (current) use of insulin: Secondary | ICD-10-CM | POA: Diagnosis not present

## 2015-06-03 DIAGNOSIS — F250 Schizoaffective disorder, bipolar type: Secondary | ICD-10-CM | POA: Diagnosis not present

## 2015-06-03 DIAGNOSIS — M5010 Cervical disc disorder with radiculopathy, unspecified cervical region: Secondary | ICD-10-CM | POA: Diagnosis not present

## 2015-06-03 DIAGNOSIS — R260 Ataxic gait: Secondary | ICD-10-CM | POA: Diagnosis not present

## 2015-06-03 DIAGNOSIS — I69354 Hemiplegia and hemiparesis following cerebral infarction affecting left non-dominant side: Secondary | ICD-10-CM | POA: Diagnosis not present

## 2015-06-04 ENCOUNTER — Telehealth (HOSPITAL_COMMUNITY): Payer: Self-pay | Admitting: *Deleted

## 2015-06-04 ENCOUNTER — Ambulatory Visit: Payer: Self-pay | Admitting: Internal Medicine

## 2015-06-04 ENCOUNTER — Other Ambulatory Visit (HOSPITAL_COMMUNITY): Payer: Self-pay | Admitting: Psychiatry

## 2015-06-04 MED ORDER — SERTRALINE HCL 100 MG PO TABS: 200.0000 mg | ORAL_TABLET | Freq: Every day | ORAL | Status: DC

## 2015-06-04 NOTE — Telephone Encounter (Signed)
done

## 2015-06-04 NOTE — Telephone Encounter (Addendum)
Pt pharmacy [facility] requesting refills for her Sertraline HCL 100 mg 2 Tablets QD. Pt medication last filled 02-02-15. Pt f/u appt is scheduled for 06-22-15. Pharmacy number is [phone].

## 2015-06-06 DIAGNOSIS — M79609 Pain in unspecified limb: Secondary | ICD-10-CM | POA: Diagnosis not present

## 2015-06-07 NOTE — Telephone Encounter (Signed)
noted 

## 2015-06-09 ENCOUNTER — Other Ambulatory Visit: Payer: Self-pay | Admitting: Family Medicine

## 2015-06-09 ENCOUNTER — Other Ambulatory Visit: Payer: Self-pay | Admitting: Internal Medicine

## 2015-06-09 ENCOUNTER — Other Ambulatory Visit (HOSPITAL_COMMUNITY): Payer: Self-pay | Admitting: Psychiatry

## 2015-06-09 DIAGNOSIS — J440 Chronic obstructive pulmonary disease with acute lower respiratory infection: Secondary | ICD-10-CM | POA: Diagnosis not present

## 2015-06-09 DIAGNOSIS — I69354 Hemiplegia and hemiparesis following cerebral infarction affecting left non-dominant side: Secondary | ICD-10-CM | POA: Diagnosis not present

## 2015-06-09 DIAGNOSIS — E119 Type 2 diabetes mellitus without complications: Secondary | ICD-10-CM | POA: Diagnosis not present

## 2015-06-09 DIAGNOSIS — I10 Essential (primary) hypertension: Secondary | ICD-10-CM | POA: Diagnosis not present

## 2015-06-09 NOTE — Telephone Encounter (Signed)
Rx(s) sent to pharmacy electronically.  

## 2015-06-10 DIAGNOSIS — F250 Schizoaffective disorder, bipolar type: Secondary | ICD-10-CM | POA: Diagnosis not present

## 2015-06-10 DIAGNOSIS — M5090 Cervical disc disorder, unspecified, unspecified cervical region: Secondary | ICD-10-CM | POA: Diagnosis not present

## 2015-06-10 DIAGNOSIS — M961 Postlaminectomy syndrome, not elsewhere classified: Secondary | ICD-10-CM | POA: Diagnosis not present

## 2015-06-10 DIAGNOSIS — J440 Chronic obstructive pulmonary disease with acute lower respiratory infection: Secondary | ICD-10-CM | POA: Diagnosis not present

## 2015-06-10 DIAGNOSIS — G894 Chronic pain syndrome: Secondary | ICD-10-CM | POA: Diagnosis not present

## 2015-06-10 DIAGNOSIS — E119 Type 2 diabetes mellitus without complications: Secondary | ICD-10-CM | POA: Diagnosis not present

## 2015-06-10 DIAGNOSIS — I10 Essential (primary) hypertension: Secondary | ICD-10-CM | POA: Diagnosis not present

## 2015-06-10 DIAGNOSIS — F17210 Nicotine dependence, cigarettes, uncomplicated: Secondary | ICD-10-CM | POA: Diagnosis not present

## 2015-06-10 DIAGNOSIS — M5010 Cervical disc disorder with radiculopathy, unspecified cervical region: Secondary | ICD-10-CM | POA: Diagnosis not present

## 2015-06-10 DIAGNOSIS — R260 Ataxic gait: Secondary | ICD-10-CM | POA: Diagnosis not present

## 2015-06-10 DIAGNOSIS — I69354 Hemiplegia and hemiparesis following cerebral infarction affecting left non-dominant side: Secondary | ICD-10-CM | POA: Diagnosis not present

## 2015-06-10 DIAGNOSIS — Z794 Long term (current) use of insulin: Secondary | ICD-10-CM | POA: Diagnosis not present

## 2015-06-10 DIAGNOSIS — Z79891 Long term (current) use of opiate analgesic: Secondary | ICD-10-CM | POA: Diagnosis not present

## 2015-06-11 ENCOUNTER — Telehealth (HOSPITAL_COMMUNITY): Payer: Self-pay | Admitting: *Deleted

## 2015-06-11 ENCOUNTER — Other Ambulatory Visit: Payer: Self-pay | Admitting: Family Medicine

## 2015-06-11 ENCOUNTER — Other Ambulatory Visit (HOSPITAL_COMMUNITY): Payer: Self-pay | Admitting: Psychiatry

## 2015-06-11 MED ORDER — TRAZODONE HCL 150 MG PO TABS: 300.0000 mg | ORAL_TABLET | Freq: Every day | ORAL | Status: DC

## 2015-06-11 NOTE — Telephone Encounter (Signed)
sent 

## 2015-06-11 NOTE — Telephone Encounter (Signed)
Pt pharmacy requesting refills for pt Trazodone 150 mg 2 tablets at bedtime. Pt medication last filled on 02-02-15 with 2 refills. Pt f/u appt is scheduled for 06-22-15. Pt pharmacy number is [phone].

## 2015-06-11 NOTE — Telephone Encounter (Signed)
noted 

## 2015-06-16 ENCOUNTER — Ambulatory Visit (HOSPITAL_COMMUNITY): Payer: Self-pay | Admitting: Psychiatry

## 2015-06-17 ENCOUNTER — Other Ambulatory Visit: Payer: Self-pay | Admitting: Family Medicine

## 2015-06-17 DIAGNOSIS — I69354 Hemiplegia and hemiparesis following cerebral infarction affecting left non-dominant side: Secondary | ICD-10-CM | POA: Diagnosis not present

## 2015-06-17 DIAGNOSIS — J440 Chronic obstructive pulmonary disease with acute lower respiratory infection: Secondary | ICD-10-CM | POA: Diagnosis not present

## 2015-06-17 DIAGNOSIS — M5010 Cervical disc disorder with radiculopathy, unspecified cervical region: Secondary | ICD-10-CM | POA: Diagnosis not present

## 2015-06-17 DIAGNOSIS — Z794 Long term (current) use of insulin: Secondary | ICD-10-CM | POA: Diagnosis not present

## 2015-06-17 DIAGNOSIS — E119 Type 2 diabetes mellitus without complications: Secondary | ICD-10-CM | POA: Diagnosis not present

## 2015-06-17 DIAGNOSIS — F17210 Nicotine dependence, cigarettes, uncomplicated: Secondary | ICD-10-CM | POA: Diagnosis not present

## 2015-06-17 DIAGNOSIS — I10 Essential (primary) hypertension: Secondary | ICD-10-CM | POA: Diagnosis not present

## 2015-06-17 DIAGNOSIS — F250 Schizoaffective disorder, bipolar type: Secondary | ICD-10-CM | POA: Diagnosis not present

## 2015-06-17 DIAGNOSIS — R260 Ataxic gait: Secondary | ICD-10-CM | POA: Diagnosis not present

## 2015-06-21 ENCOUNTER — Telehealth: Payer: Self-pay

## 2015-06-21 DIAGNOSIS — I739 Peripheral vascular disease, unspecified: Secondary | ICD-10-CM | POA: Diagnosis not present

## 2015-06-21 DIAGNOSIS — R7309 Other abnormal glucose: Secondary | ICD-10-CM | POA: Diagnosis not present

## 2015-06-21 DIAGNOSIS — E1151 Type 2 diabetes mellitus with diabetic peripheral angiopathy without gangrene: Secondary | ICD-10-CM | POA: Diagnosis not present

## 2015-06-21 DIAGNOSIS — L603 Nail dystrophy: Secondary | ICD-10-CM | POA: Diagnosis not present

## 2015-06-21 DIAGNOSIS — E161 Other hypoglycemia: Secondary | ICD-10-CM | POA: Diagnosis not present

## 2015-06-21 NOTE — Telephone Encounter (Signed)
Wants to be referred to a different cardiologist. The foot Dr called this am and said her BP and her sugar was low and she kept passing out (or falling asleep) and they called here and was advised to call EMS. They did an EKG and asked her if she ever had a stress test and she said no. She said the cardio she is seeing now does nothing for her and wants to be referred to another. Please advise

## 2015-06-22 ENCOUNTER — Telehealth (HOSPITAL_COMMUNITY): Payer: Self-pay | Admitting: *Deleted

## 2015-06-22 ENCOUNTER — Other Ambulatory Visit (HOSPITAL_COMMUNITY): Payer: Self-pay | Admitting: Psychiatry

## 2015-06-22 ENCOUNTER — Encounter (HOSPITAL_COMMUNITY): Payer: Self-pay | Admitting: Psychiatry

## 2015-06-22 ENCOUNTER — Telehealth: Payer: Self-pay | Admitting: Internal Medicine

## 2015-06-22 ENCOUNTER — Other Ambulatory Visit: Payer: Self-pay

## 2015-06-22 ENCOUNTER — Telehealth: Payer: Self-pay | Admitting: Family Medicine

## 2015-06-22 ENCOUNTER — Ambulatory Visit (INDEPENDENT_AMBULATORY_CARE_PROVIDER_SITE_OTHER): Payer: Commercial Managed Care - HMO | Admitting: Psychiatry

## 2015-06-22 VITALS — BP 106/64 | HR 58 | Ht 61.0 in | Wt 155.4 lb

## 2015-06-22 DIAGNOSIS — R040 Epistaxis: Secondary | ICD-10-CM

## 2015-06-22 DIAGNOSIS — F331 Major depressive disorder, recurrent, moderate: Secondary | ICD-10-CM

## 2015-06-22 MED ORDER — LORAZEPAM 0.5 MG PO TABS: 0.5000 mg | ORAL_TABLET | Freq: Three times a day (TID) | ORAL | Status: DC

## 2015-06-22 MED ORDER — SERTRALINE HCL 100 MG PO TABS: 200.0000 mg | ORAL_TABLET | Freq: Every day | ORAL | Status: DC

## 2015-06-22 MED ORDER — RISPERIDONE 0.5 MG PO TABS: 0.5000 mg | ORAL_TABLET | Freq: Every day | ORAL | Status: DC

## 2015-06-22 MED ORDER — TRAZODONE HCL 150 MG PO TABS: 300.0000 mg | ORAL_TABLET | Freq: Every day | ORAL | Status: DC

## 2015-06-22 MED ORDER — DICLOFENAC SODIUM 1 % TD GEL: 2.0000 g | Freq: Every day | TRANSDERMAL | Status: DC | PRN

## 2015-06-22 MED ORDER — NYAMYC 100000 UNIT/GM EX POWD: CUTANEOUS | Status: DC

## 2015-06-22 NOTE — Telephone Encounter (Signed)
Her BP today was 112/56. See previous message sent about referral to another cardio. Also do you want her to come in for a BP check or is that BP acceptable?
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
## 2015-06-22 NOTE — Telephone Encounter (Signed)
Spoke with [name] Mola, RN with [facility] and she stated pt is better today. Her b/s was 135 at lunch. She wanted Dr Cruzita Lederer to be aware that this happened at another Dr's appt. They called EMS, gave her glucose tabs and her b/s came up to 110 while she was there. Her b/p was low as well. The call was just to make Dr Cruzita Lederer aware of the situation. Be advised.

## 2015-06-22 NOTE — Telephone Encounter (Signed)
Please decrease - Toujeo 25 >> 22 units at bedtime - Novolog:  10 units with a smaller meal (take no more than this when going out after that meal)  14 >> 12 units with a larger meal

## 2015-06-22 NOTE — Telephone Encounter (Signed)
[name] from [facility] is calling to inform [name] that Ms. Pages Blood Pressure today was 112/56 , and to also make her aware of the falling/passing out episode at the [facility] and denied EMS transport to the [facility]

## 2015-06-22 NOTE — Telephone Encounter (Signed)
Per Dr. Harrington Challenger to call in pt Ativan to her pharmacy after she printed script for pt. Called pt pharmacy and spoke with Nicki Reaper and he showed understanding.

## 2015-06-22 NOTE — Telephone Encounter (Signed)
[name] from [facility] stated that patient had a fall yesterday, she wasn't injured, but her b/s was 46. [phone]

## 2015-06-22 NOTE — Patient Instructions (Signed)
[name]  06/22/2015     @[email or web address] @   Your procedure is scheduled on  06/29/2015   Report to [name] at  720  A.M.  Call this number if you have problems the morning of surgery:  [phone]   Remember:  Do not eat food or drink liquids after midnight.  Take these medicines the morning of surgery with A SIP OF WATER  Norvasc, clonidine, hydrocodone, synthroid, ativan, robaxin, metoprolol, singulair, lyrica, zoloft. Take 1/2 of your usual insulin dosage the night before your surgery. Take your inhaler before you come and bring it with you.   Do not wear jewelry, make-up or nail polish.  Do not wear lotions, powders, or perfumes.  You may wear deodorant.  Do not shave 48 hours prior to surgery.  Men may shave face and neck.  Do not bring valuables to the hospital.  [facility] is not responsible for any belongings or valuables.  Contacts, dentures or bridgework may not be worn into surgery.  Leave your suitcase in the car.  After surgery it may be brought to your room.  For patients admitted to the hospital, discharge time will be determined by your treatment team.  Patients discharged the day of surgery will not be allowed to drive home.   Name and phone number of your driver:   family Special instructions:  none  Please read over the following fact sheets that you were given. Coughing and Deep Breathing, Surgical Site Infection Prevention, Anesthesia Post-op Instructions and Care and Recovery After Surgery         Posterior Colporrhaphy Anterior or posterior colporrhaphy is surgery to fix a prolapse of organs in the genital tract. Prolapse means the falling down, bulging, dropping, or drooping of an organ. Organs that commonly prolapse include the rectum, bladder, vagina, and uterus. Prolapse can affect a single organ or several organs at the same time. This often worsens when women stop having their monthly periods (menopause) because estrogen loss  weakens the muscles and tissues in the genital tract. In addition, prolapse happens when the organs are damaged or weakened. This commonly happens after childbirth and as a result of aging. Surgery is often done for severe prolapses.  The type of colporrhaphy done depends on the type of genital prolapse. Types of genital prolapse include the following:   Cystocele. This is a prolapse of the upper (anterior) wall of the vagina. The anterior wall bulges into the vagina and brings the bladder with it.   Rectocele. This is a prolapse of the lower (posterior) wall of the vagina. The posterior vaginal wall bulges into the vagina and brings the rectum with it.   Enterocele. This is a prolapse of part of the pelvic organs called the pouch of Douglas. It also involves a portion of the small bowel. It appears as a bulge under the neck of the uterus at the top of the back wall of the vagina.   Procidentia. This is a complete prolapse of the uterus and the cervix. The prolapse can be seen and felt coming out of the vagina. LET [facility] CARE PROVIDER KNOW ABOUT:   Any allergies you have.   All medicines you are taking, including vitamins, herbs, eye drops, creams, and over-the-counter medicines.   Previous problems you or members of your family have had with the use of anesthetics.   Any blood disorders you have.   Previous surgeries you have had.  Medical conditions you have.   Smoking history or history of alcohol use.   Possibility of pregnancy, if this applies.  RISKS AND COMPLICATIONS Generally, anterior or posterior colporrhaphy is a safe procedure. However, as with any procedure, complications can occur. Possible complications include:   Infection.   Damage to other organs during surgery.   Bleeding after surgery.   Problems urinating.   Problems from the anesthetic.  BEFORE THE PROCEDURE  Ask your health care provider about changing or stopping your regular  medicines.   Do not eat or drink anything for at least 8 hours before the surgery.   If you smoke, do not smoke for at least 2 weeks before the surgery.   Make plans to have someone drive you home after your hospital stay. Also, arrange for someone to help you with activities during recovery. PROCEDURE  You may be given medicine to help you relax before the surgery (sedative). During the surgery you will be given medicine to make you sleep through the procedure (general anesthetic) or medicine to numb you from the waist down (spinal anesthetic). This medicine will be given through an intravenous (IV) access tube that is put into one of your veins.  The procedure will vary depending on the type of repair:   Anterior repair. A cut (incision) is made in the midline section of the front part of the vaginal wall. A triangular-shaped piece of vaginal tissue is removed, and the stronger, healthier tissue is sewn together in order to support and suspend the bladder.   Posterior repair. An incision is made midline on the back wall of the vagina. A triangular portion of vaginal skin is removed to expose the muscle. Excess tissue is removed, and stronger, healthier muscle and ligament tissue is sewn together to support the rectum.   Anterior and posterior repair. Both procedures are done during the same surgery. AFTER THE PROCEDURE You will be taken to a recovery area. Your blood pressure, pulse, breathing, and temperature (vital signs) will be monitored. This is done until you are stable. Then you will be transferred to a hospital room.  After surgery, you will have a small rubber tube in place to drain your bladder (urinary catheter). This will be in place for 2 to 7 days or until your bladder is working properly on its own. The IV access tube will be removed in 1 to 3 days. You may have a gauze packing in your vagina to prevent bleeding. This will be removed 2 or 3 days after the surgery. You will  likely need to stay in the hospital for 3 to 5 days.    This information is not intended to replace advice given to you by your health care provider. Make sure you discuss any questions you have with your health care provider.   Document Released: 05/20/2003 Document Revised: 10/30/2012 Document Reviewed: 07/19/2012 Elsevier Interactive Patient Education 2016 [employer]  Posterior Colporrhaphy, Care After Refer to this sheet in the next few weeks. These instructions provide you with information on caring for yourself after your procedure. Your health care provider may also give you more specific instructions. Your treatment has been planned according to current medical practices, but problems sometimes occur. Call your health care provider if you have any problems or questions after your procedure. HOME CARE INSTRUCTIONS   Take frequent rest periods throughout the day.   Only take over-the-counter or prescription medicines as directed by your health care provider.   Avoid strenuous activity  such as heavy lifting (more than 10 pounds [4.5 kg]), pushing, and pulling until your health care provider says it is okay.   Take showers if your health care provider approves. Pat incisions dry. Do not rub incisions with a washcloth or towel. Do not take tub baths until your health care provider approves.   Wear compression stockings as directed by your health care provider. These stockings help prevent blood clots from forming in your legs.   Talk with your health care provider about when you may return to work and your exercise routine.   Do not drive until your health care provider approves.   You may resume your normal diet. Eat a well-balanced diet.   Drink enough fluids to keep your urine clear or pale yellow.   Your normal bowel function should return. If you become constipated, you may:   Take a mild laxative.   Add fruit and bran to your diet.   Drink more fluids.  Do  not have sexual intercourse until permitted by your health care provider.  Follow up with your health care provider as directed. SEEK MEDICAL CARE IF: You have persistent nausea or vomiting.  SEEK IMMEDIATE MEDICAL CARE IF:   You have increased bleeding (more than a small spot) from the vaginal area.   Your pain is not relieved with medicine or becomes worse.   You have redness, swelling, or increasing pain in the vaginal area.   You have abdominal pain.   You see pus coming from the wounds.   You develop a fever.   You have a foul smell coming from your vaginal area.   You develop light-headedness or you feel faint.   You have difficulty breathing.  MAKE SURE YOU:  Understand these instructions.  Will watch your condition.  Will get help right away if you are not doing well or get worse.   This information is not intended to replace advice given to you by your health care provider. Make sure you discuss any questions you have with your health care provider.   Document Released: 09/15/2004 Document Revised: 10/30/2012 Document Reviewed: 07/19/2012 Elsevier Interactive Patient Education 2016 [employer] PATIENT INSTRUCTIONS POST-ANESTHESIA  IMMEDIATELY FOLLOWING SURGERY:  Do not drive or operate machinery for the first twenty four hours after surgery.  Do not make any important decisions for twenty four hours after surgery or while taking narcotic pain medications or sedatives.  If you develop intractable nausea and vomiting or a severe headache please notify your doctor immediately.  FOLLOW-UP:  Please make an appointment with your surgeon as instructed. You do not need to follow up with anesthesia unless specifically instructed to do so.  WOUND CARE INSTRUCTIONS (if applicable):  Keep a dry clean dressing on the anesthesia/puncture wound site if there is drainage.  Once the wound has quit draining you may leave it open to air.  Generally you should leave the  bandage intact for twenty four hours unless there is drainage.  If the epidural site drains for more than 36-48 hours please call the anesthesia department.  QUESTIONS?:  Please feel free to call your physician or the hospital operator if you have any questions, and they will be happy to assist you.

## 2015-06-23 ENCOUNTER — Ambulatory Visit (INDEPENDENT_AMBULATORY_CARE_PROVIDER_SITE_OTHER): Payer: Commercial Managed Care - HMO | Admitting: Obstetrics and Gynecology

## 2015-06-23 ENCOUNTER — Encounter: Payer: Self-pay | Admitting: Obstetrics and Gynecology

## 2015-06-23 ENCOUNTER — Other Ambulatory Visit: Payer: Self-pay | Admitting: Family Medicine

## 2015-06-23 ENCOUNTER — Encounter (HOSPITAL_COMMUNITY): Payer: Self-pay

## 2015-06-23 ENCOUNTER — Encounter (HOSPITAL_COMMUNITY)
Admission: RE | Admit: 2015-06-23 | Discharge: 2015-06-23 | Disposition: A | Discharge status: Home / Self Care | Payer: Commercial Managed Care - HMO | Source: Ambulatory Visit | Attending: Obstetrics and Gynecology | Admitting: Obstetrics and Gynecology

## 2015-06-23 ENCOUNTER — Other Ambulatory Visit: Payer: Self-pay | Admitting: Obstetrics and Gynecology

## 2015-06-23 VITALS — BP 130/90 | Ht 61.0 in | Wt 154.0 lb

## 2015-06-23 DIAGNOSIS — Z01812 Encounter for preprocedural laboratory examination: Secondary | ICD-10-CM | POA: Diagnosis not present

## 2015-06-23 DIAGNOSIS — R42 Dizziness and giddiness: Secondary | ICD-10-CM

## 2015-06-23 DIAGNOSIS — N816 Rectocele: Secondary | ICD-10-CM | POA: Diagnosis not present

## 2015-06-23 DIAGNOSIS — R0989 Other specified symptoms and signs involving the circulatory and respiratory systems: Secondary | ICD-10-CM

## 2015-06-23 LAB — URINALYSIS, ROUTINE W REFLEX MICROSCOPIC
BILIRUBIN URINE: NEGATIVE
Glucose, UA: NEGATIVE mg/dL
HGB URINE DIPSTICK: NEGATIVE
Ketones, ur: NEGATIVE mg/dL
Leukocytes, UA: NEGATIVE
NITRITE: NEGATIVE
Protein, ur: NEGATIVE mg/dL
Specific Gravity, Urine: 1.025 (ref 1.005–1.030)
pH: 5.5 (ref 5.0–8.0)

## 2015-06-23 LAB — CBC
HCT: 35.4 % — ABNORMAL LOW (ref 36.0–46.0)
HEMOGLOBIN: 10.8 g/dL — AB (ref 12.0–15.0)
MCH: 22.9 pg — AB (ref 26.0–34.0)
MCHC: 30.5 g/dL (ref 30.0–36.0)
MCV: 75 fL — AB (ref 78.0–100.0)
Platelets: 233 10*3/uL (ref 150–400)
RBC: 4.72 MIL/uL (ref 3.87–5.11)
RDW: 14.4 % (ref 11.5–15.5)
WBC: 6.2 10*3/uL (ref 4.0–10.5)

## 2015-06-23 LAB — COMPREHENSIVE METABOLIC PANEL
ALK PHOS: 94 U/L (ref 38–126)
ALT: 24 U/L (ref 14–54)
AST: 23 U/L (ref 15–41)
Albumin: 4 g/dL (ref 3.5–5.0)
Anion gap: 7 (ref 5–15)
BUN: 25 mg/dL — ABNORMAL HIGH (ref 6–20)
CALCIUM: 9 mg/dL (ref 8.9–10.3)
CO2: 25 mmol/L (ref 22–32)
CREATININE: 0.94 mg/dL (ref 0.44–1.00)
Chloride: 110 mmol/L (ref 101–111)
Glucose, Bld: 51 mg/dL — ABNORMAL LOW (ref 65–99)
Potassium: 3.8 mmol/L (ref 3.5–5.1)
Sodium: 142 mmol/L (ref 135–145)
Total Bilirubin: 0.4 mg/dL (ref 0.3–1.2)
Total Protein: 7.1 g/dL (ref 6.5–8.1)

## 2015-06-23 NOTE — Addendum Note (Signed)
Addended by: Eual Fines on: 06/23/2015 04:56 PM   Modules accepted: Orders

## 2015-06-23 NOTE — Telephone Encounter (Signed)
Called pt and advised her per Dr Arman Filter message. Pt voiced understanding. Pt to have rectocele surgery next Tuesday. Be advised.

## 2015-06-23 NOTE — Telephone Encounter (Signed)
pls refer her to ENT for epistaxis, I will sign, I will refer her to cardiology, let he rknwio

## 2015-06-23 NOTE — Telephone Encounter (Signed)
States she doesn't want to come in for a bp check because she saw Dr Glo Herring today and her BP was 130/90 and its documented in the computer. Wants to be referred to  cardio in [place] and also she said she has been having frequent nose bleeds just on the left side -the same side that she hit her head awhile back. Wants to know if its concerning and what you think needs to be done about it.

## 2015-06-23 NOTE — Telephone Encounter (Signed)
Nurse bP check is fine as able, as far as new card Doc will need to review further , they all work together and do the same thing,

## 2015-06-23 NOTE — Telephone Encounter (Signed)
Documented response in 2nd message

## 2015-06-23 NOTE — Telephone Encounter (Signed)
Patient referred.

## 2015-06-23 NOTE — Progress Notes (Signed)
Patient ID: [name], female   DOB: 29-Aug-1950, 65 y.o.   MRN: [id number]  Preoperative History and Physical  [name] is a 58 y.o. [phone] here for surgical management of rectal incontinence and rectocele due to incomplete evacuation.   No significant preoperative concerns.  Pt is presenting today for follow-up regarding rectocele s/p abdominal hysterectomy, performed by Dr. Katherina Right about 37 years ago, per medical records. Patient complains of bowel incontinence, with typically 2 episodes per day per pt today, as well as bladder incontinence - she states she regularly wears Depends underpants. Patient states her incontinent stools are soft but not runny. Patient states she last had a BM yesterday. Patient states she has previously used Golytely for chronic constipation.    Proposed surgery: Posterior repair   Past Medical History  Diagnosis Date  . Pancreatitis 2006    due to Depakote with normal EUS   . Osteoporosis   . Chronic back pain   . Migraines     chronic headaches  . Diverticulosis     TCS 9/08 by Dr. Delfin Edis for diarrhea . Bx for micro scopic colitis negative.   . Schatzki's ring     non critical / EGD with ED 8/2011with RMR  . Glaucoma     eye drops daily  . Anemia   . Blood transfusion   . Stroke [facility])     left sided weakness  . Metabolic encephalopathy 7/51/7001  . Sleep apnea     on CPAP  . Arthritis   . Gum symptoms     infection on antibiotic  . Chronic neck pain   . Mononeuritis lower limb   . Frequent falls   . Diabetes mellitus     Type II  . Complication of anesthesia     "hard to put sleep"  . Cervical disc disorder with radiculopathy of cervical region 10/31/2012  . COPD (chronic obstructive pulmonary disease) with chronic bronchitis ([place]) 09/16/2013    Office Spirometry 10/30/2013-submaximal effort based on appearance of loop and curve. Numbers would fit with severe restriction but her physiologic capability may be better than this. FVC  0.91/44%, and 10.74/45%, FEV1/FVC 0.81, FEF 25-75% 1.43/69%    . Carpal tunnel syndrome of right wrist 05/23/2011  . Hemiplegia affecting non-dominant side, post-stroke ([place]) 08/02/2011  . Hypertension     takes Amlodipine,Metoprolol,and Clonidine daily  . Hyperlipidemia     takes Crestor daily  . Insomnia     takes Trazodone nightly  . Depression     takes Zoloft daily  . Bipolar disorder ([place])     takes Risperdal nightly  . GERD (gastroesophageal reflux disease)     takes Aciphex daily  . Anxiety     takes Ativan daily  . Hypothyroidism     takes Synthroid daily  . Seizures ([place])     takes Lamictal daily.Last seizure 3 yrs ago   Past Surgical History  Procedure Laterality Date  . Abdominal hysterectomy  1978  . Cholecystectomy  1984  . Ovarian cyst removal    . Carpal tunnel release Left 07/22/04    Dr. Aline Brochure  . Breast reduction surgery  1994  . Cataract extraction Bilateral   . Biopsy thyroid  2009  . Surgical excision of 3 tumors from right thigh and right buttock  and left upper thigh  2010  . Back surgery  July 2012  . Spine surgery  09/29/2010    Dr. Rolena Infante  . Maloney dilation  12/29/2010  RMR;  . Esophagogastroduodenoscopy  12/29/2010    Rourk-Retained food in the esophagus and stomach, small hiatal hernia, status post Maloney dilation of the esophagus  . Craniotomy  11/23/2011    Procedure: CRANIOTOMY TUMOR EXCISION;  Surgeon: Hosie Spangle, MD;  Location: [place] NEURO ORS;  Service: Neurosurgery;  Laterality: N/A;  Craniotomy for tumor resection  . Brain surgery  11/2011    resection of meningioma  . Colonoscopy N/A 09/25/2012    QVZ:DGLOVFI diverticulosis.  colonic polyp-removed : tubular adenoma  . Esophagogastroduodenoscopy N/A 09/25/2012    EPP:IRJJOACZ atonic baggy esophagus status post Maloney dilation 64 F. Hiatal hernia  . Givens capsule study N/A 01/15/2013    NORMAL.   . Bacterial overgrowth test N/A 05/05/2013    Procedure: BACTERIAL OVERGROWTH  TEST;  Surgeon: Daneil Dolin, MD;  Location: [facility];  Service: Endoscopy;  Laterality: N/A;  7:30  . Transthoracic echocardiogram  2010    EF 60-65%, mild conc LVH, grade 1 diastolic dysfunction; mildly calcified MV annulus with mildly thickened leaflets, mildly calcified MR annulus  . Nm myocar perf wall motion  2006    "relavtiely normal" persantine, mild anterior thinning (breast attenuation artifact), no region of scar/ischemia  . Cardiac catheterization  05/10/2005    normal coronaries, normal LV systolic function and EF (Dr. Jackie Plum)  . Tooth extraction Bilateral 12/14/2014    Procedure: REMOVAL OF BILATERAL MANDIBULAR EXOSTOSES;  Surgeon: Diona Browner, DDS;  Location: [place];  Service: Oral Surgery;  Laterality: Bilateral;  . Lesion removal N/A 05/31/2015    Procedure: REMOVAL RIGHT AND LEFT LESIONS OF MANDIBLE;  Surgeon: Diona Browner, DDS;  Location: [place];  Service: Oral Surgery;  Laterality: N/A;   OB History  Gravida Para Term Preterm AB SAB TAB Ectopic Multiple Living  6 1 1  5 5         # Outcome Date GA Lbr Len/2nd Weight Sex Delivery Anes PTL Lv  6 SAB           5 SAB           4 SAB           3 SAB           2 SAB           1 Term             Patient denies any other pertinent gynecologic issues.   Current Outpatient Prescriptions on File Prior to Visit  Medication Sig Dispense Refill  . ACCU-CHEK FASTCLIX LANCETS MISC Use to test blood sugar 4 times daily. Dx: E10.65 408 each 1  . albuterol (PROVENTIL HFA;VENTOLIN HFA) 108 (90 BASE) MCG/ACT inhaler Inhale 1 puff into the lungs 3 (three) times daily. 1 Inhaler 2  . Alcohol Swabs (B-D SINGLE USE SWABS REGULAR) PADS Use for injections and glucose testing 8 times daily. Dx: E10.65 900 each 1  . amLODipine (NORVASC) 5 MG tablet TAKE 1 TABLET BY MOUTH ONCE DAILY. 30 tablet 3  . aspirin EC 81 MG tablet Take 81 mg by mouth daily.    . BD PEN NEEDLE NANO U/F 32G X 4 MM MISC USE FOUR TIMES DAILY 360 each 3  . Blood  Glucose Calibration (ACCU-CHEK SMARTVIEW CONTROL) LIQD 1 each by Other route as needed. 1 each 2  . Blood Glucose Monitoring Suppl (ACCU-CHEK NANO SMARTVIEW) W/DEVICE KIT 1 each by Does not apply route daily. Dx: E10.65 1 kit 0  . Cholecalciferol (D 5000) 5000 UNITS  capsule Take 5,000 Units by mouth daily.    . clobetasol cream (TEMOVATE) 1.61 % Apply 1 application topically daily.    . cloNIDine (CATAPRES) 0.3 MG tablet TAKE 1 TABLET BY MOUTH EVERY EIGHT HOURS. ONCE AT 8AM, 4PM, AND 12 MIDNIGHT. 90 tablet 3  . diclofenac sodium (VOLTAREN) 1 % GEL Apply 2 g topically daily as needed (Pain). 100 g 2  . dicyclomine (BENTYL) 10 MG capsule TAKE 1 CAPSULE BY MOUTH THREE TIMES DAILY BEFORE MEALS. 90 capsule 2  . glucose blood (ACCU-CHEK SMARTVIEW) test strip Use to test blood sugar 4 times daily as instructed. Dx: E10.65 375 each 1  . HYDROcodone-acetaminophen (NORCO) 5-325 MG tablet Take 1 tablet by mouth every 6 (six) hours as needed for moderate pain. 30 tablet 0  . hydroxychloroquine (PLAQUENIL) 200 MG tablet Take 200 mg by mouth daily.    . insulin aspart (NOVOLOG FLEXPEN) 100 UNIT/ML FlexPen Inject 14-18 Units into the skin 3 (three) times daily with meals. 30 mL 1  . Insulin Glargine (TOUJEO SOLOSTAR) 300 UNIT/ML SOPN Inject 35 Units into the skin at bedtime. 6 pen 1  . lamoTRIgine (LAMICTAL) 100 MG tablet TAKE 1 TABLET BY MOUTH TWICE DAILY. 60 tablet 5  . levothyroxine (SYNTHROID, LEVOTHROID) 50 MCG tablet TAKE 1 TABLET BY MOUTH ONCE DAILY AND 1/2 TABLET ON SUNDAYS. 30 tablet 1  . LORazepam (ATIVAN) 0.5 MG tablet Take 1 tablet (0.5 mg total) by mouth 3 (three) times daily. 90 tablet 1  . methocarbamol (ROBAXIN) 500 MG tablet Take 500 mg by mouth daily.    . metoprolol (LOPRESSOR) 50 MG tablet Take 1 tablet (50 mg total) by mouth 2 (two) times daily. <PLEASE MAKE APPOINTMENT FOR REFILLS> 60 tablet 0  . montelukast (SINGULAIR) 10 MG tablet TAKE ONE TABLET BY MOUTH ONCE DAILY. 30 tablet 3  .  Multiple Vitamins-Minerals (ONE-A-DAY 50 PLUS PO) Take 1 tablet by mouth daily.    [name] Nystatin (NYAMYC) 100000 UNIT/GM POWD APPLY TO AFFECTED AREA 4 TIMES DAILY. 30 g 2  . polyethylene glycol powder (MIRALAX) powder Take 17 g by mouth daily. To prevent constipation 255 g prn  . pregabalin (LYRICA) 75 MG capsule Take 75 mg by mouth 2 (two) times daily.     . RABEprazole (ACIPHEX) 20 MG tablet TAKE 1 TABLET BY MOUTH TWICE DAILY. 60 tablet 1  . RESTASIS 0.05 % ophthalmic emulsion Place 1 drop into both eyes 2 (two) times daily.     . risperiDONE (RISPERDAL) 0.5 MG tablet Take 1 tablet (0.5 mg total) by mouth at bedtime. 30 tablet 3  . rosuvastatin (CRESTOR) 5 MG tablet TAKE 1 TABLET BY MOUTH AT BEDTIME. 30 tablet 3  . sertraline (ZOLOFT) 100 MG tablet Take 2 tablets (200 mg total) by mouth daily. 60 tablet 3  . traZODone (DESYREL) 150 MG tablet Take 2 tablets (300 mg total) by mouth at bedtime. 60 tablet 3  . vitamin E 400 UNIT capsule Take 400 Units by mouth daily.     No current facility-administered medications on file prior to visit.   Allergies  Allergen Reactions  . Cephalexin Hives  . Iron Nausea And Vomiting  . Milk-Related Compounds Other (See Comments)    Doesn't agree with stomach.   . Penicillins Hives    Has patient had a PCN reaction causing immediate rash, facial/tongue/throat swelling, SOB or lightheadedness with hypotension: Yes Has patient had a PCN reaction causing severe rash involving mucus membranes or skin necrosis: No Has patient had a  PCN reaction that required hospitalization No Has patient had a PCN reaction occurring within the last 10 years: No If all of the above answers are "NO", then may proceed with Cephalosporin use.   [name] Phenazopyridine Hcl Hives          Social History:   reports that she has been smoking Cigarettes.  She has a 3.5 pack-year smoking history. She has never used smokeless tobacco. She reports that she does not drink alcohol or use illicit  drugs.  Family History  Problem Relation Age of Onset  . Heart attack Mother     HTN  . Pneumonia Father   . Kidney failure Father   . Diabetes Father   . Pancreatic cancer Sister   . Diabetes Brother   . Hypertension Brother   . Diabetes Brother   . Colon cancer Neg Hx   . Anesthesia problems Neg Hx   . Hypotension Neg Hx   . Malignant hyperthermia Neg Hx   . Pseudochol deficiency Neg Hx   . Stroke Maternal Grandmother   . Heart attack Maternal Grandfather   . Cancer Sister     breast   . Alcohol abuse Maternal Uncle   . Hypertension Brother   . Hypertension Son   . Sleep apnea Son     Review of Systems: Noncontributory  PHYSICAL EXAM: Blood pressure 130/90, height '5\' 1"'$  (1.549 m), weight 154 lb (69.854 kg). General appearance - alert, well appearing, and in no distress Chest - clear to auscultation, no wheezes, rales or rhonchi, symmetric air entry Heart - normal rate and regular rhythm Abdomen - soft, nontender, nondistended, no masses or organomegaly Pelvic - examination   Vagina - Normal size introitus, permits 2 fingers  Rectal - Posterior wall laxity, poor sphincter control, evidence of defect more on right side  Extremities - peripheral pulses normal, no pedal edema, no clubbing or cyanosis  Labs: No results found. However, due to the size of the patient record, not all encounters were searched. Please check Results Review for a complete set of results.  Imaging Studies: No results found.  Assessment: Patient Active Problem List   Diagnosis Date Noted  . Frontal sinusitis 05/10/2015  . Acute cystitis 05/10/2015  . Poorly controlled type 2 diabetes mellitus with circulatory disorder ([place]) 05/06/2015  . Multinodular goiter 05/06/2015  . Acute conjunctivitis of left eye 04/27/2015  . Rectocele, female 04/27/2015  . Anal sphincter incontinence 04/27/2015  . Pelvic relaxation due to rectocele 03/30/2015  . Hospital discharge follow-up 03/02/2015  .  Conjunctivitis of left eye 03/02/2015  . Hyperkalemia 02/22/2015  . Pulmonary hypertension ([place]) 02/22/2015  . LUE weakness   . Chest pain 02/21/2015  . Chest tightness 02/21/2015  . Weakness of left upper extremity 02/21/2015  . AKI (acute kidney injury) ([place]) 02/21/2015  . Light cigarette smoker (1-9 cigarettes per day) 01/11/2015  . Annual physical exam 01/05/2015  . Rib pain on left side 12/30/2014  . Light headedness 10/20/2014  . Migraine without aura and without status migrainosus, not intractable 07/02/2014  . Flatulence 02/18/2014  . Microcytic anemia 02/18/2014  . COPD (chronic obstructive pulmonary disease) with chronic bronchitis ([place]) 09/16/2013  . Hypothyroidism 08/16/2013  . Gastroparesis 04/28/2013  . Seizure disorder ([place]) 01/19/2013  . Cervical disc disorder with radiculopathy of cervical region 10/31/2012  . Solitary pulmonary nodule 08/19/2012  . Anemia 07/05/2012  . 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 Allergic sinusitis 04/18/2012  . Meningioma ([place]) 11/19/2011  .  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 Hemiplegia affecting non-dominant side, post-stroke ([place]) 08/02/2011  . Carpal tunnel syndrome of right wrist 05/23/2011  . Polypharmacy 04/28/2011  . Bipolar disorder ([place]) 04/28/2011  . Constipation 04/13/2011  . Falls frequently 12/12/2010  . Urinary incontinence 12/16/2009  . HEARING LOSS 10/26/2009  . Hyperlipidemia 12/11/2008  . IBS 12/11/2008  . GERD 07/29/2008  . MILK PRODUCTS ALLERGY 07/29/2008  . 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 INSOMNIA 11/03/2007  . Essential hypertension 06/27/2007  . Backache 06/19/2007  . Osteoporosis 06/19/2007  . Obstructive sleep apnea 06/19/2007  . TRIGGER FINGER 04/18/2007  . DIVERTICULOSIS, COLON 11/13/2006    Plan: Patient will undergo surgical management with posterior repair. Follow up in 1 week after surgery for post-op check.     .mec 06/23/2015 10:41 AM    By signing my name below, I, [name], attest that this documentation has been prepared under the direction and in the presence of Jonnie Kind, MD. Electronically Signed: [name], ED Scribe. 06/23/2015. 10:52 AM.  I personally performed the services described in this documentation, which was SCRIBED in my presence. The recorded information has been reviewed and considered accurate. It has been edited as necessary during review. Jonnie Kind, MD

## 2015-06-24 ENCOUNTER — Other Ambulatory Visit (HOSPITAL_COMMUNITY): Payer: Self-pay

## 2015-06-27 ENCOUNTER — Encounter: Payer: Self-pay | Admitting: Family Medicine

## 2015-06-27 DIAGNOSIS — M5442 Lumbago with sciatica, left side: Secondary | ICD-10-CM | POA: Insufficient documentation

## 2015-06-27 DIAGNOSIS — R109 Unspecified abdominal pain: Secondary | ICD-10-CM | POA: Insufficient documentation

## 2015-06-27 NOTE — Assessment & Plan Note (Signed)
hUncontrolled.Toradol and depo medrol administered IM in the office , Return to pain management, referral entered, short course of hydrocodone prescribed and she is not to take any other pain med wit  this

## 2015-06-28 ENCOUNTER — Ambulatory Visit: Payer: Self-pay | Admitting: Neurology

## 2015-06-28 DIAGNOSIS — J440 Chronic obstructive pulmonary disease with acute lower respiratory infection: Secondary | ICD-10-CM | POA: Diagnosis not present

## 2015-06-28 NOTE — Assessment & Plan Note (Signed)

## 2015-06-28 NOTE — Assessment & Plan Note (Addendum)
UnControlled, no change in medication DASH diet and commitment to daily physical activity for a minimum of 30 minutes discussed and encouraged, as a part of hypertension management. The importance of attaining a healthy weight is also discussed.  BP/Weight 06/23/2015 06/23/2015 05/31/2015 05/19/2015 05/19/2015 05/10/2015 [id number]  Systolic BP [id number]  Diastolic BP 90 66 56 70 60 60 60  Wt. (Lbs) 154 - 159 159 159 157 157  BMI 29.11 - 30.06 30.06 30.06 29.68 29.68  Some encounter information is confidential and restricted. Go to Review Flowsheets activity to see all data.

## 2015-06-28 NOTE — Assessment & Plan Note (Signed)
Left flank pain with hypertension, will order renal US, also stool tested for hidden blood and is negative In house UA negative for blood, likely referred pain from back, will f/u  Renal US

## 2015-06-28 NOTE — Assessment & Plan Note (Signed)
Managed by endo

## 2015-06-28 NOTE — H&P (Signed)
Expand All Collapse All   Patient ID: [name], female DOB: 02/21/1951, 65 y.o. MRN: [id number]  Preoperative History and Physical  [name] is a 65 y.o. [phone] here for surgical management of rectal incontinence and rectocele due to incomplete evacuation. No significant preoperative concerns.  Pt is presenting today for follow-up regarding rectocele s/p abdominal hysterectomy, performed by Dr. Katherina Right about 37 years ago, per medical records. Patient complains of bowel incontinence, with typically 2 episodes per day per pt today, as well as bladder incontinence - she states she regularly wears Depends underpants. Patient states her incontinent stools are soft but not runny. Patient states she last had a BM yesterday. Patient states she has previously used Golytely for chronic constipation.   Proposed surgery: Posterior repair   Past Medical History  Diagnosis Date  . Pancreatitis 2006    due to Depakote with normal EUS   . Osteoporosis   . Chronic back pain   . Migraines     chronic headaches  . Diverticulosis     TCS 9/08 by Dr. Delfin Edis for diarrhea . Bx for micro scopic colitis negative.   . Schatzki's ring     non critical / EGD with ED 8/2011with RMR  . Glaucoma     eye drops daily  . Anemia   . Blood transfusion   . Stroke [facility])     left sided weakness  . Metabolic encephalopathy 8/65/7846  . Sleep apnea     on CPAP  . Arthritis   . Gum symptoms     infection on antibiotic  . Chronic neck pain   . Mononeuritis lower limb   . Frequent falls   . Diabetes mellitus     Type II  . Complication of anesthesia     "hard to put sleep"  . Cervical disc disorder with radiculopathy of cervical region 10/31/2012  . COPD (chronic obstructive pulmonary disease) with chronic bronchitis ([place]) 09/16/2013    Office Spirometry 10/30/2013-submaximal effort based on  appearance of loop and curve. Numbers would fit with severe restriction but her physiologic capability may be better than this. FVC 0.91/44%, and 10.74/45%, FEV1/FVC 0.81, FEF 25-75% 1.43/69%   . Carpal tunnel syndrome of right wrist 05/23/2011  . Hemiplegia affecting non-dominant side, post-stroke ([place]) 08/02/2011  . Hypertension     takes Amlodipine,Metoprolol,and Clonidine daily  . Hyperlipidemia     takes Crestor daily  . Insomnia     takes Trazodone nightly  . Depression     takes Zoloft daily  . Bipolar disorder ([place])     takes Risperdal nightly  . GERD (gastroesophageal reflux disease)     takes Aciphex daily  . Anxiety     takes Ativan daily  . Hypothyroidism     takes Synthroid daily  . Seizures ([place])     takes Lamictal daily.Last seizure 3 yrs ago   Past Surgical History  Procedure Laterality Date  . Abdominal hysterectomy  1978  . Cholecystectomy  1984  . Ovarian cyst removal    . Carpal tunnel release Left 07/22/04    Dr. Aline Brochure  . Breast reduction surgery  1994  . Cataract extraction Bilateral   . Biopsy thyroid  2009  . Surgical excision of 3 tumors from right thigh and right buttock and left upper thigh  2010  . Back surgery  July 2012  . Spine surgery  09/29/2010    Dr. Rolena Infante  . Maloney dilation  12/29/2010    RMR;  .  Esophagogastroduodenoscopy  12/29/2010    Rourk-Retained food in the esophagus and stomach, small hiatal hernia, status post Maloney dilation of the esophagus  . Craniotomy  11/23/2011    Procedure: CRANIOTOMY TUMOR EXCISION; Surgeon: Hosie Spangle, MD; Location: [place] [facility]; Service: Neurosurgery; Laterality: N/A; Craniotomy for tumor resection  . Brain surgery  11/2011    resection of meningioma  . Colonoscopy N/A 09/25/2012    XYI:[id number] diverticulosis. colonic polyp-removed : tubular  adenoma  . Esophagogastroduodenoscopy N/A 09/25/2012    SMO:[id number] atonic baggy esophagus status post Maloney dilation 68 F. Hiatal hernia  . Givens capsule study N/A 01/15/2013    NORMAL.   . Bacterial overgrowth test N/A 05/05/2013    Procedure: BACTERIAL OVERGROWTH TEST; Surgeon: Daneil Dolin, MD; Location: [facility]; Service: Endoscopy; Laterality: N/A; 7:30  . Transthoracic echocardiogram  2010    EF 60-65%, mild conc LVH, grade 1 diastolic dysfunction; mildly calcified MV annulus with mildly thickened leaflets, mildly calcified MR annulus  . Nm myocar perf wall motion  2006    "relavtiely normal" persantine, mild anterior thinning (breast attenuation artifact), no region of scar/ischemia  . Cardiac catheterization  05/10/2005    normal coronaries, normal LV systolic function and EF (Dr. Jackie Plum)  . Tooth extraction Bilateral 12/14/2014    Procedure: REMOVAL OF BILATERAL MANDIBULAR EXOSTOSES; Surgeon: Diona Browner, DDS; Location: [place]; Service: Oral Surgery; Laterality: Bilateral;  . Lesion removal N/A 05/31/2015    Procedure: REMOVAL RIGHT AND LEFT LESIONS OF MANDIBLE; Surgeon: Diona Browner, DDS; Location: [place]; Service: Oral Surgery; Laterality: N/A;   OB History  Gravida Para Term Preterm AB SAB TAB Ectopic Multiple Living  6 1 1  5 5         # Outcome Date GA Lbr Len/2nd Weight Sex Delivery Anes PTL Lv  6 SAB           5 SAB           4 SAB           3 SAB           2 SAB           1 Term             Patient denies any other pertinent gynecologic issues.   Current Outpatient Prescriptions on File Prior to Visit  Medication Sig Dispense Refill  . ACCU-CHEK FASTCLIX LANCETS MISC Use to test blood sugar 4 times daily. Dx: E10.65 408 each 1  . albuterol (PROVENTIL HFA;VENTOLIN HFA) 108 (90  BASE) MCG/ACT inhaler Inhale 1 puff into the lungs 3 (three) times daily. 1 Inhaler 2  . Alcohol Swabs (B-D SINGLE USE SWABS REGULAR) PADS Use for injections and glucose testing 8 times daily. Dx: E10.65 900 each 1  . amLODipine (NORVASC) 5 MG tablet TAKE 1 TABLET BY MOUTH ONCE DAILY. 30 tablet 3  . aspirin EC 81 MG tablet Take 81 mg by mouth daily.    . BD PEN NEEDLE NANO U/F 32G X 4 MM MISC USE FOUR TIMES DAILY 360 each 3  . Blood Glucose Calibration (ACCU-CHEK SMARTVIEW CONTROL) LIQD 1 each by Other route as needed. 1 each 2  . Blood Glucose Monitoring Suppl (ACCU-CHEK NANO SMARTVIEW) W/DEVICE KIT 1 each by Does not apply route daily. Dx: E10.65 1 kit 0  . Cholecalciferol (D 5000) 5000 UNITS capsule Take 5,000 Units by mouth daily.    . clobetasol cream (TEMOVATE) 4.92 % Apply 1 application topically daily.    [name]  cloNIDine (CATAPRES) 0.3 MG tablet TAKE 1 TABLET BY MOUTH EVERY EIGHT HOURS. ONCE AT 8AM, 4PM, AND 12 MIDNIGHT. 90 tablet 3  . diclofenac sodium (VOLTAREN) 1 % GEL Apply 2 g topically daily as needed (Pain). 100 g 2  . dicyclomine (BENTYL) 10 MG capsule TAKE 1 CAPSULE BY MOUTH THREE TIMES DAILY BEFORE MEALS. 90 capsule 2  . glucose blood (ACCU-CHEK SMARTVIEW) test strip Use to test blood sugar 4 times daily as instructed. Dx: E10.65 375 each 1  . HYDROcodone-acetaminophen (NORCO) 5-325 MG tablet Take 1 tablet by mouth every 6 (six) hours as needed for moderate pain. 30 tablet 0  . hydroxychloroquine (PLAQUENIL) 200 MG tablet Take 200 mg by mouth daily.    . insulin aspart (NOVOLOG FLEXPEN) 100 UNIT/ML FlexPen Inject 14-18 Units into the skin 3 (three) times daily with meals. 30 mL 1  . Insulin Glargine (TOUJEO SOLOSTAR) 300 UNIT/ML SOPN Inject 35 Units into the skin at bedtime. 6 pen 1  . lamoTRIgine (LAMICTAL) 100 MG tablet TAKE 1 TABLET BY MOUTH TWICE DAILY. 60 tablet 5  . levothyroxine  (SYNTHROID, LEVOTHROID) 50 MCG tablet TAKE 1 TABLET BY MOUTH ONCE DAILY AND 1/2 TABLET ON SUNDAYS. 30 tablet 1  . LORazepam (ATIVAN) 0.5 MG tablet Take 1 tablet (0.5 mg total) by mouth 3 (three) times daily. 90 tablet 1  . methocarbamol (ROBAXIN) 500 MG tablet Take 500 mg by mouth daily.    . metoprolol (LOPRESSOR) 50 MG tablet Take 1 tablet (50 mg total) by mouth 2 (two) times daily. <PLEASE MAKE APPOINTMENT FOR REFILLS> 60 tablet 0  . montelukast (SINGULAIR) 10 MG tablet TAKE ONE TABLET BY MOUTH ONCE DAILY. 30 tablet 3  . Multiple Vitamins-Minerals (ONE-A-DAY 50 PLUS PO) Take 1 tablet by mouth daily.    [name] Nystatin (NYAMYC) 100000 UNIT/GM POWD APPLY TO AFFECTED AREA 4 TIMES DAILY. 30 g 2  . polyethylene glycol powder (MIRALAX) powder Take 17 g by mouth daily. To prevent constipation 255 g prn  . pregabalin (LYRICA) 75 MG capsule Take 75 mg by mouth 2 (two) times daily.     . RABEprazole (ACIPHEX) 20 MG tablet TAKE 1 TABLET BY MOUTH TWICE DAILY. 60 tablet 1  . RESTASIS 0.05 % ophthalmic emulsion Place 1 drop into both eyes 2 (two) times daily.     . risperiDONE (RISPERDAL) 0.5 MG tablet Take 1 tablet (0.5 mg total) by mouth at bedtime. 30 tablet 3  . rosuvastatin (CRESTOR) 5 MG tablet TAKE 1 TABLET BY MOUTH AT BEDTIME. 30 tablet 3  . sertraline (ZOLOFT) 100 MG tablet Take 2 tablets (200 mg total) by mouth daily. 60 tablet 3  . traZODone (DESYREL) 150 MG tablet Take 2 tablets (300 mg total) by mouth at bedtime. 60 tablet 3  . vitamin E 400 UNIT capsule Take 400 Units by mouth daily.     No current facility-administered medications on file prior to visit.   Allergies  Allergen Reactions  . Cephalexin Hives  . Iron Nausea And Vomiting  . Milk-Related Compounds Other (See Comments)    Doesn't agree with stomach.   . Penicillins Hives    Has patient had a PCN reaction causing immediate rash,  facial/tongue/throat swelling, SOB or lightheadedness with hypotension: Yes Has patient had a PCN reaction causing severe rash involving mucus membranes or skin necrosis: No Has patient had a PCN reaction that required hospitalization No Has patient had a PCN reaction occurring within the last 10 years: No If all of the above  answers are "NO", then may proceed with Cephalosporin use.   [name] Phenazopyridine Hcl Hives         Social History:  reports that she has been smoking Cigarettes. She has a 3.5 pack-year smoking history. She has never used smokeless tobacco. She reports that she does not drink alcohol or use illicit drugs.  Family History  Problem Relation Age of Onset  . Heart attack Mother     HTN  . Pneumonia Father   . Kidney failure Father   . Diabetes Father   . Pancreatic cancer Sister   . Diabetes Brother   . Hypertension Brother   . Diabetes Brother   . Colon cancer Neg Hx   . Anesthesia problems Neg Hx   . Hypotension Neg Hx   . Malignant hyperthermia Neg Hx   . Pseudochol deficiency Neg Hx   . Stroke Maternal Grandmother   . Heart attack Maternal Grandfather   . Cancer Sister     breast   . Alcohol abuse Maternal Uncle   . Hypertension Brother   . Hypertension Son   . Sleep apnea Son     Review of Systems: Noncontributory  PHYSICAL EXAM: Blood pressure 130/90, height 5' 1"  (1.549 m), weight 154 lb (69.854 kg). General appearance - alert, well appearing, and in no distress Chest - clear to auscultation, no wheezes, rales or rhonchi, symmetric air entry Heart - normal rate and regular rhythm Abdomen - soft, nontender, nondistended, no masses or organomegaly Pelvic - examination  Vagina - Normal size introitus, permits 2 fingers Rectal - Posterior wall laxity, poor sphincter control, evidence of defect more on right side   Extremities - peripheral pulses normal, no pedal edema, no clubbing or cyanosis  Labs: No results found. However, due to the size of the patient record, not all encounters were searched. Please check Results Review for a complete set of results.  Imaging Studies:  Imaging Results    No results found.    Assessment: Patient Active Problem List   Diagnosis Date Noted  . Frontal sinusitis 05/10/2015  . Acute cystitis 05/10/2015  . Poorly controlled type 2 diabetes mellitus with circulatory disorder ([place]) 05/06/2015  . Multinodular goiter 05/06/2015  . Acute conjunctivitis of left eye 04/27/2015  . Rectocele, female 04/27/2015  . Anal sphincter incontinence 04/27/2015  . Pelvic relaxation due to rectocele 03/30/2015  . Hospital discharge follow-up 03/02/2015  . Conjunctivitis of left eye 03/02/2015  . Hyperkalemia 02/22/2015  . Pulmonary hypertension ([place]) 02/22/2015  . LUE weakness   . Chest pain 02/21/2015  . Chest tightness 02/21/2015  . Weakness of left upper extremity 02/21/2015  . AKI (acute kidney injury) ([place]) 02/21/2015  . Light cigarette smoker (1-9 cigarettes per day) 01/11/2015  . Annual physical exam 01/05/2015  . Rib pain on left side 12/30/2014  . Light headedness 10/20/2014  . Migraine without aura and without status migrainosus, not intractable 07/02/2014  . Flatulence 02/18/2014  . Microcytic anemia 02/18/2014  . COPD (chronic obstructive pulmonary disease) with chronic bronchitis ([place]) 09/16/2013  . Hypothyroidism 08/16/2013  . Gastroparesis 04/28/2013  . Seizure disorder ([place]) 01/19/2013  . Cervical disc disorder with radiculopathy of cervical region 10/31/2012  . Solitary pulmonary nodule 08/19/2012  . Anemia 07/05/2012  . 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 Allergic sinusitis 04/18/2012  . Meningioma ([place])  11/19/2011  . 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 Hemiplegia affecting non-dominant side, post-stroke ([place]) 08/02/2011  . Carpal tunnel syndrome of right  wrist 05/23/2011  . Polypharmacy 04/28/2011  . Bipolar disorder ([place]) 04/28/2011  . Constipation 04/13/2011  . Falls frequently 12/12/2010  . Urinary incontinence 12/16/2009  . HEARING LOSS 10/26/2009  . Hyperlipidemia 12/11/2008  . IBS 12/11/2008  . GERD 07/29/2008  . MILK PRODUCTS ALLERGY 07/29/2008  . 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 INSOMNIA 11/03/2007  . Essential hypertension 06/27/2007  . Backache 06/19/2007  . Osteoporosis 06/19/2007  . Obstructive sleep apnea 06/19/2007  . TRIGGER FINGER 04/18/2007  . DIVERTICULOSIS, COLON 11/13/2006    Plan: Patient will undergo surgical management with posterior repair. Follow up in 1 week after surgery for post-op check.

## 2015-06-28 NOTE — Assessment & Plan Note (Signed)
Managed by endo Ms. Dhanani is reminded of the importance of commitment to daily physical activity for 30 minutes or more, as able and the need to limit carbohydrate intake to 30 to 60 grams per meal to help with blood sugar control.   The need to take medication as prescribed, test blood sugar as directed, and to call between visits if there is a concern that blood sugar is uncontrolled is also discussed.   Ms. Diercks is reminded of the importance of daily foot exam, annual eye examination, and good blood sugar, blood pressure and cholesterol control.  Diabetic Labs Latest Ref Rng 06/23/2015 05/31/2015 05/01/2015 03/02/2015 02/22/2015  HbA1c 4.8 - 5.6 % - 6.2(H) - - -  Microalbumin <2.0 mg/dL - - - - -  Micro/Creat Ratio 0.0 - 30.0 mg/g - - - - -  Chol 0 - 200 mg/dL - - - - -  HDL >40 mg/dL - - - - -  Calc LDL 0 - 99 mg/dL - - - - -  Triglycerides <150 mg/dL - - - - -  Creatinine 0.44 - 1.00 mg/dL 0.94 0.90 1.02(H) 1.23(H) 1.16(H)  GFR >60.00 mL/min - - - - -   BP/Weight 06/23/2015 06/23/2015 05/31/2015 05/19/2015 05/19/2015 05/10/2015 [id number]  Systolic BP [id number]  Diastolic BP 90 66 56 70 60 60 60  Wt. (Lbs) 154 - 159 159 159 157 157  BMI 29.11 - 30.06 30.06 30.06 29.68 29.68  Some encounter information is confidential and restricted. Go to Review Flowsheets activity to see all data.   Foot/eye exam completion dates Latest Ref Rng 10/06/2014 10/03/2013  Eye Exam No Retinopathy No Retinopathy No Retinopathy  Foot exam Order - - -  Foot Form Completion - - -

## 2015-06-29 ENCOUNTER — Ambulatory Visit (HOSPITAL_COMMUNITY): Payer: Commercial Managed Care - HMO | Admitting: Anesthesiology

## 2015-06-29 ENCOUNTER — Encounter (HOSPITAL_COMMUNITY): Payer: Self-pay | Admitting: *Deleted

## 2015-06-29 ENCOUNTER — Observation Stay (HOSPITAL_COMMUNITY)
Admission: RE | Admit: 2015-06-29 | Discharge: 2015-06-30 | Disposition: A | Discharge status: Home / Self Care | Payer: Commercial Managed Care - HMO | Source: Ambulatory Visit | Attending: Obstetrics and Gynecology | Admitting: Obstetrics and Gynecology

## 2015-06-29 ENCOUNTER — Ambulatory Visit: Payer: Self-pay | Admitting: *Deleted

## 2015-06-29 ENCOUNTER — Encounter (HOSPITAL_COMMUNITY)
Admission: RE | Disposition: A | Discharge status: Home / Self Care | Payer: Self-pay | Source: Ambulatory Visit | Attending: Obstetrics and Gynecology

## 2015-06-29 DIAGNOSIS — N816 Rectocele: Principal | ICD-10-CM | POA: Insufficient documentation

## 2015-06-29 DIAGNOSIS — Z794 Long term (current) use of insulin: Secondary | ICD-10-CM | POA: Diagnosis not present

## 2015-06-29 DIAGNOSIS — F319 Bipolar disorder, unspecified: Secondary | ICD-10-CM | POA: Diagnosis not present

## 2015-06-29 DIAGNOSIS — I10 Essential (primary) hypertension: Secondary | ICD-10-CM | POA: Diagnosis not present

## 2015-06-29 DIAGNOSIS — R569 Unspecified convulsions: Secondary | ICD-10-CM | POA: Insufficient documentation

## 2015-06-29 DIAGNOSIS — F329 Major depressive disorder, single episode, unspecified: Secondary | ICD-10-CM | POA: Insufficient documentation

## 2015-06-29 DIAGNOSIS — G4730 Sleep apnea, unspecified: Secondary | ICD-10-CM | POA: Insufficient documentation

## 2015-06-29 DIAGNOSIS — Z79899 Other long term (current) drug therapy: Secondary | ICD-10-CM | POA: Diagnosis not present

## 2015-06-29 DIAGNOSIS — E669 Obesity, unspecified: Secondary | ICD-10-CM | POA: Insufficient documentation

## 2015-06-29 DIAGNOSIS — E039 Hypothyroidism, unspecified: Secondary | ICD-10-CM | POA: Insufficient documentation

## 2015-06-29 DIAGNOSIS — F419 Anxiety disorder, unspecified: Secondary | ICD-10-CM | POA: Diagnosis not present

## 2015-06-29 DIAGNOSIS — R32 Unspecified urinary incontinence: Secondary | ICD-10-CM | POA: Diagnosis not present

## 2015-06-29 DIAGNOSIS — K219 Gastro-esophageal reflux disease without esophagitis: Secondary | ICD-10-CM | POA: Diagnosis not present

## 2015-06-29 DIAGNOSIS — M1991 Primary osteoarthritis, unspecified site: Secondary | ICD-10-CM | POA: Diagnosis not present

## 2015-06-29 DIAGNOSIS — E785 Hyperlipidemia, unspecified: Secondary | ICD-10-CM | POA: Insufficient documentation

## 2015-06-29 DIAGNOSIS — F17210 Nicotine dependence, cigarettes, uncomplicated: Secondary | ICD-10-CM | POA: Diagnosis not present

## 2015-06-29 DIAGNOSIS — F209 Schizophrenia, unspecified: Secondary | ICD-10-CM | POA: Insufficient documentation

## 2015-06-29 DIAGNOSIS — Z6829 Body mass index (BMI) 29.0-29.9, adult: Secondary | ICD-10-CM | POA: Insufficient documentation

## 2015-06-29 DIAGNOSIS — I69354 Hemiplegia and hemiparesis following cerebral infarction affecting left non-dominant side: Secondary | ICD-10-CM | POA: Diagnosis not present

## 2015-06-29 DIAGNOSIS — E119 Type 2 diabetes mellitus without complications: Secondary | ICD-10-CM | POA: Insufficient documentation

## 2015-06-29 DIAGNOSIS — Z7982 Long term (current) use of aspirin: Secondary | ICD-10-CM | POA: Diagnosis not present

## 2015-06-29 DIAGNOSIS — R150 Incomplete defecation: Secondary | ICD-10-CM | POA: Insufficient documentation

## 2015-06-29 DIAGNOSIS — J449 Chronic obstructive pulmonary disease, unspecified: Secondary | ICD-10-CM | POA: Insufficient documentation

## 2015-06-29 HISTORY — PX: RECTOCELE REPAIR: SHX761

## 2015-06-29 LAB — GLUCOSE, CAPILLARY
GLUCOSE-CAPILLARY: 62 mg/dL — AB (ref 65–99)
GLUCOSE-CAPILLARY: 96 mg/dL (ref 65–99)
GLUCOSE-CAPILLARY: 92 mg/dL (ref 65–99)
Glucose-Capillary: 162 mg/dL — ABNORMAL HIGH (ref 65–99)
Glucose-Capillary: 124 mg/dL — ABNORMAL HIGH (ref 65–99)

## 2015-06-29 SURGERY — COLPORRHAPHY, POSTERIOR, FOR RECTOCELE REPAIR
Anesthesia: Spinal

## 2015-06-29 MED ORDER — SODIUM CHLORIDE 0.9 % IV SOLN
INTRAVENOUS | Status: DC
Start: 2015-06-29 — End: 2015-06-30
  Administered 2015-06-29: 125 mL/h via INTRAVENOUS

## 2015-06-29 MED ORDER — LACTATED RINGERS IV SOLN
INTRAVENOUS | Status: DC
  Administered 2015-06-29: 08:00:00 via INTRAVENOUS

## 2015-06-29 MED ORDER — ONDANSETRON HCL 4 MG/2ML IJ SOLN: 4.0000 mg | Freq: Four times a day (QID) | INTRAMUSCULAR | Status: DC | PRN

## 2015-06-29 MED ORDER — POLYETHYLENE GLYCOL 3350 17 G PO PACK
17.0000 g | PACK | Freq: Every day | ORAL | Status: DC
  Administered 2015-06-29: 17 g via ORAL
  Filled 2015-06-29: qty 1

## 2015-06-29 MED ORDER — FENTANYL CITRATE (PF) 100 MCG/2ML IJ SOLN
INTRAMUSCULAR | Status: AC
  Filled 2015-06-29: qty 2

## 2015-06-29 MED ORDER — LIDOCAINE HCL (PF) 1 % IJ SOLN
INTRAMUSCULAR | Status: AC
  Filled 2015-06-29: qty 5

## 2015-06-29 MED ORDER — MIDAZOLAM HCL 2 MG/2ML IJ SOLN: 1.0000 mg | INTRAMUSCULAR | Status: DC | PRN

## 2015-06-29 MED ORDER — CIPROFLOXACIN IN D5W 400 MG/200ML IV SOLN
INTRAVENOUS | Status: AC
  Filled 2015-06-29: qty 200

## 2015-06-29 MED ORDER — OXYCODONE-ACETAMINOPHEN 5-325 MG PO TABS
1.0000 | ORAL_TABLET | ORAL | Status: DC | PRN
  Administered 2015-06-29: 1 via ORAL
  Filled 2015-06-29: qty 1

## 2015-06-29 MED ORDER — BUPIVACAINE-EPINEPHRINE (PF) 0.5% -1:200000 IJ SOLN
INTRAMUSCULAR | Status: AC
  Filled 2015-06-29: qty 30

## 2015-06-29 MED ORDER — BUPIVACAINE IN DEXTROSE 0.75-8.25 % IT SOLN
INTRATHECAL | Status: DC | PRN
  Administered 2015-06-29: 11.25 mg via INTRATHECAL

## 2015-06-29 MED ORDER — FENTANYL CITRATE (PF) 100 MCG/2ML IJ SOLN: 25.0000 ug | INTRAMUSCULAR | Status: DC | PRN

## 2015-06-29 MED ORDER — DEXTROSE 50 % IV SOLN
INTRAVENOUS | Status: AC
  Filled 2015-06-29: qty 50

## 2015-06-29 MED ORDER — CLONIDINE HCL 0.2 MG PO TABS
0.3000 mg | ORAL_TABLET | Freq: Every day | ORAL | Status: DC
  Filled 2015-06-29: qty 1

## 2015-06-29 MED ORDER — PROPOFOL 10 MG/ML IV BOLUS
INTRAVENOUS | Status: AC
  Filled 2015-06-29: qty 20

## 2015-06-29 MED ORDER — AMLODIPINE BESYLATE 5 MG PO TABS
5.0000 mg | ORAL_TABLET | Freq: Every day | ORAL | Status: DC
  Administered 2015-06-29: 5 mg via ORAL
  Filled 2015-06-29: qty 1

## 2015-06-29 MED ORDER — INSULIN GLARGINE 100 UNIT/ML ~~LOC~~ SOLN
30.0000 [IU] | Freq: Every day | SUBCUTANEOUS | Status: DC
  Administered 2015-06-29: 30 [IU] via SUBCUTANEOUS
  Filled 2015-06-29 (×2): qty 0.3

## 2015-06-29 MED ORDER — KETOROLAC TROMETHAMINE 30 MG/ML IJ SOLN
30.0000 mg | Freq: Four times a day (QID) | INTRAMUSCULAR | Status: DC
  Administered 2015-06-29 – 2015-06-30 (×4): 30 mg via INTRAVENOUS
  Filled 2015-06-29 (×3): qty 1

## 2015-06-29 MED ORDER — BUPIVACAINE IN DEXTROSE 0.75-8.25 % IT SOLN
INTRATHECAL | Status: AC
  Filled 2015-06-29: qty 2

## 2015-06-29 MED ORDER — INSULIN ASPART 100 UNIT/ML ~~LOC~~ SOLN: 0.0000 [IU] | Freq: Three times a day (TID) | SUBCUTANEOUS | Status: DC

## 2015-06-29 MED ORDER — 0.9 % SODIUM CHLORIDE (POUR BTL) OPTIME
TOPICAL | Status: DC | PRN
  Administered 2015-06-29: 1000 mL

## 2015-06-29 MED ORDER — CLINDAMYCIN PHOSPHATE 900 MG/50ML IV SOLN
INTRAVENOUS | Status: AC
  Filled 2015-06-29: qty 50

## 2015-06-29 MED ORDER — ONDANSETRON HCL 4 MG PO TABS: 4.0000 mg | ORAL_TABLET | Freq: Four times a day (QID) | ORAL | Status: DC | PRN

## 2015-06-29 MED ORDER — HYDROXYCHLOROQUINE SULFATE 200 MG PO TABS
200.0000 mg | ORAL_TABLET | Freq: Every day | ORAL | Status: DC
  Filled 2015-06-29 (×3): qty 1

## 2015-06-29 MED ORDER — HYDROMORPHONE HCL 1 MG/ML IJ SOLN: 0.5000 mg | INTRAMUSCULAR | Status: DC | PRN

## 2015-06-29 MED ORDER — PREGABALIN 75 MG PO CAPS
75.0000 mg | ORAL_CAPSULE | Freq: Two times a day (BID) | ORAL | Status: DC
  Administered 2015-06-29 (×2): 75 mg via ORAL
  Filled 2015-06-29 (×2): qty 1

## 2015-06-29 MED ORDER — FENTANYL CITRATE (PF) 100 MCG/2ML IJ SOLN
INTRAMUSCULAR | Status: DC | PRN
  Administered 2015-06-29: 12.5 ug via INTRATHECAL

## 2015-06-29 MED ORDER — INSULIN ASPART 100 UNIT/ML ~~LOC~~ SOLN
4.0000 [IU] | Freq: Three times a day (TID) | SUBCUTANEOUS | Status: DC
  Administered 2015-06-29: 4 [IU] via SUBCUTANEOUS

## 2015-06-29 MED ORDER — CLINDAMYCIN PHOSPHATE 900 MG/50ML IV SOLN
900.0000 mg | INTRAVENOUS | Status: AC
  Administered 2015-06-29: 900 mg via INTRAVENOUS

## 2015-06-29 MED ORDER — BUPIVACAINE-EPINEPHRINE 0.5% -1:200000 IJ SOLN
INTRAMUSCULAR | Status: DC | PRN
  Administered 2015-06-29: 10 mL

## 2015-06-29 MED ORDER — LAMOTRIGINE 100 MG PO TABS
100.0000 mg | ORAL_TABLET | Freq: Two times a day (BID) | ORAL | Status: DC
  Administered 2015-06-29 (×2): 100 mg via ORAL
  Filled 2015-06-29 (×2): qty 1

## 2015-06-29 MED ORDER — MONTELUKAST SODIUM 10 MG PO TABS
10.0000 mg | ORAL_TABLET | Freq: Every day | ORAL | Status: DC
  Administered 2015-06-29: 10 mg via ORAL
  Filled 2015-06-29: qty 1

## 2015-06-29 MED ORDER — KETOROLAC TROMETHAMINE 30 MG/ML IJ SOLN: 30.0000 mg | Freq: Four times a day (QID) | INTRAMUSCULAR | Status: DC

## 2015-06-29 MED ORDER — CIPROFLOXACIN IN D5W 400 MG/200ML IV SOLN
400.0000 mg | INTRAVENOUS | Status: AC
  Administered 2015-06-29: 400 mg via INTRAVENOUS

## 2015-06-29 MED ORDER — DEXTROSE 50 % IV SOLN
12.5000 g | Freq: Once | INTRAVENOUS | Status: AC
  Administered 2015-06-29: 12.5 g via INTRAVENOUS

## 2015-06-29 MED ORDER — RISPERIDONE 0.5 MG PO TABS
0.5000 mg | ORAL_TABLET | Freq: Every day | ORAL | Status: DC
  Administered 2015-06-29: 0.5 mg via ORAL
  Filled 2015-06-29: qty 1

## 2015-06-29 MED ORDER — PANTOPRAZOLE SODIUM 40 MG PO TBEC
40.0000 mg | DELAYED_RELEASE_TABLET | Freq: Every day | ORAL | Status: DC
  Filled 2015-06-29: qty 1

## 2015-06-29 MED ORDER — KETOROLAC TROMETHAMINE 30 MG/ML IJ SOLN
30.0000 mg | Freq: Once | INTRAMUSCULAR | Status: AC
  Administered 2015-06-29: 30 mg via INTRAVENOUS
  Filled 2015-06-29: qty 1

## 2015-06-29 MED ORDER — POLYETHYLENE GLYCOL 3350 17 GM/SCOOP PO POWD
17.0000 g | Freq: Every day | ORAL | Status: DC
  Filled 2015-06-29: qty 255

## 2015-06-29 MED ORDER — LEVOTHYROXINE SODIUM 50 MCG PO TABS
50.0000 ug | ORAL_TABLET | Freq: Every day | ORAL | Status: DC
  Filled 2015-06-29: qty 1

## 2015-06-29 MED ORDER — TRAZODONE HCL 50 MG PO TABS
300.0000 mg | ORAL_TABLET | Freq: Every day | ORAL | Status: DC
  Administered 2015-06-29: 300 mg via ORAL
  Filled 2015-06-29: qty 6

## 2015-06-29 MED ORDER — IBUPROFEN 600 MG PO TABS: 600.0000 mg | ORAL_TABLET | Freq: Four times a day (QID) | ORAL | Status: DC | PRN

## 2015-06-29 MED ORDER — ONDANSETRON HCL 4 MG/2ML IJ SOLN: 4.0000 mg | Freq: Once | INTRAMUSCULAR | Status: DC | PRN

## 2015-06-29 MED ORDER — INSULIN ASPART 100 UNIT/ML ~~LOC~~ SOLN: 0.0000 [IU] | Freq: Every day | SUBCUTANEOUS | Status: DC

## 2015-06-29 MED ORDER — SERTRALINE HCL 50 MG PO TABS
200.0000 mg | ORAL_TABLET | Freq: Every day | ORAL | Status: DC
  Administered 2015-06-29: 200 mg via ORAL
  Filled 2015-06-29: qty 4

## 2015-06-29 MED ORDER — METOPROLOL TARTRATE 50 MG PO TABS
50.0000 mg | ORAL_TABLET | Freq: Two times a day (BID) | ORAL | Status: DC
  Administered 2015-06-29 (×2): 50 mg via ORAL
  Filled 2015-06-29 (×2): qty 1

## 2015-06-29 SURGICAL SUPPLY — 32 items
BAG HAMPER (MISCELLANEOUS) ×3 IMPLANT
CLOTH BEACON ORANGE TIMEOUT ST (SAFETY) ×3 IMPLANT
COVER LIGHT HANDLE STERIS (MISCELLANEOUS) ×6 IMPLANT
DECANTER SPIKE VIAL GLASS SM (MISCELLANEOUS) ×3 IMPLANT
DRAPE PROXIMA HALF (DRAPES) ×3 IMPLANT
DRAPE STERI URO 9X17 APER PCH (DRAPES) ×3 IMPLANT
ELECT REM PT RETURN 9FT ADLT (ELECTROSURGICAL) ×3
ELECTRODE REM PT RTRN 9FT ADLT (ELECTROSURGICAL) ×1 IMPLANT
FORMALIN 10 PREFIL 480ML (MISCELLANEOUS) ×3 IMPLANT
GAUZE PACKING 2X5 YD STRL (GAUZE/BANDAGES/DRESSINGS) ×3 IMPLANT
GLOVE BIO SURGEON STRL SZ7 (GLOVE) ×4 IMPLANT
GLOVE BIOGEL PI IND STRL 7.0 (GLOVE) ×1 IMPLANT
GLOVE BIOGEL PI IND STRL 9 (GLOVE) ×1 IMPLANT
GLOVE BIOGEL PI INDICATOR 7.0 (GLOVE) ×4
GLOVE BIOGEL PI INDICATOR 9 (GLOVE) ×2
GLOVE ECLIPSE 6.5 STRL STRAW (GLOVE) ×2 IMPLANT
GLOVE ECLIPSE 9.0 STRL (GLOVE) ×7 IMPLANT
GOWN SPEC L3 XXLG W/TWL (GOWN DISPOSABLE) ×4 IMPLANT
GOWN STRL REUS W/TWL LRG LVL3 (GOWN DISPOSABLE) ×7 IMPLANT
KIT ROOM TURNOVER AP CYSTO (KITS) ×3 IMPLANT
MANIFOLD NEPTUNE II (INSTRUMENTS) ×3 IMPLANT
NDL HYPO 25X1 1.5 SAFETY (NEEDLE) ×1 IMPLANT
NEEDLE HYPO 25X1 1.5 SAFETY (NEEDLE) ×3 IMPLANT
NS IRRIG 1000ML POUR BTL (IV SOLUTION) ×3 IMPLANT
PACK PERI GYN (CUSTOM PROCEDURE TRAY) ×3 IMPLANT
PAD ARMBOARD 7.5X6 YLW CONV (MISCELLANEOUS) ×3 IMPLANT
SET BASIN LINEN APH (SET/KITS/TRAYS/PACK) ×3 IMPLANT
SUT CHROMIC 2 0 CT 1 (SUTURE) ×6 IMPLANT
SUT VIC AB 0 CT2 8-18 (SUTURE) IMPLANT
SYR BULB IRRIGATION 50ML (SYRINGE) ×2 IMPLANT
SYR CONTROL 10ML LL (SYRINGE) ×3 IMPLANT
TRAY FOLEY CATH SILVER 16FR (SET/KITS/TRAYS/PACK) IMPLANT

## 2015-06-29 NOTE — Interval H&P Note (Signed)
History and Physical Interval Note:  06/29/2015 8:19 AM  [name]  has presented today for surgery, with the diagnosis of rectocele  The various methods of treatment have been discussed with the patient and family. After consideration of risks, benefits and other options for treatment, the patient has consented to  Procedure(s): POSTERIOR REPAIR (RECTOCELE) (N/A) as a surgical intervention .  The patient's history has been reviewed, patient examined, no change in status, stable for surgery.  I have reviewed the patient's chart and labs.  Questions were answered to the patient's satisfaction.   Patient will be observed overnight.  Jonnie Kind

## 2015-06-29 NOTE — Anesthesia Preprocedure Evaluation (Signed)
Anesthesia Evaluation  Patient identified by MRN, date of birth, ID band Patient awake    Reviewed: Allergy & Precautions, NPO status , Patient's Chart, lab work & pertinent test results, reviewed documented beta blocker date and time   Airway Mallampati: III       Dental  (+) Edentulous Upper, Edentulous Lower   Pulmonary sleep apnea and Continuous Positive Airway Pressure Ventilation , COPD,  COPD inhaler, Current Smoker,    Pulmonary exam normal        Cardiovascular hypertension, Pt. on medications and Pt. on home beta blockers + Peripheral Vascular Disease, + PND and + DOE  Normal cardiovascular exam     Neuro/Psych  Headaches, Seizures -, Well Controlled,  Anxiety Depression Bipolar Disorder Schizophrenia CVA, Residual Symptoms    GI/Hepatic GERD  Poorly Controlled,  Endo/Other  diabetes, Poorly Controlled, Type 2, Insulin Dependent  Renal/GU      Musculoskeletal  (+) Arthritis , Osteoarthritis,    Abdominal (+) + obese,   Peds  Hematology  (+) anemia ,   Anesthesia Other Findings   Reproductive/Obstetrics                             Anesthesia Physical Anesthesia Plan  ASA: III  Anesthesia Plan: Spinal   Post-op Pain Management:    Induction:   Airway Management Planned: Nasal Cannula  Additional Equipment:   Intra-op Plan:   Post-operative Plan:   Informed Consent: I have reviewed the patients History and Physical, chart, labs and discussed the procedure including the risks, benefits and alternatives for the proposed anesthesia with the patient or authorized representative who has indicated his/her understanding and acceptance.     Plan Discussed with: CRNA  Anesthesia Plan Comments:         Anesthesia Quick Evaluation

## 2015-06-29 NOTE — Progress Notes (Signed)
Patient in waiting room asleep when called back. Patient ambulated to preop room, but once lying down on stretcher patient asleep, but arousable. Difficult to ask questions. Blood sugar 62,  half an amp of D50 given. Follow up blood sugar 162, but patient continues to be very drowsy. Oxygen in place. Son at bedside. Dr. Milford Cage made aware and no preop medications given.

## 2015-06-29 NOTE — Brief Op Note (Signed)
06/29/2015  10:07 AM  PATIENT:  [name]  65 y.o. female  PRE-OPERATIVE DIAGNOSIS:  rectocele and anal incontinence  POST-OPERATIVE DIAGNOSIS:  rectocele, anal incontinence  PROCEDURE:  Procedure(s): POSTERIOR REPAIR (RECTOCELE) (N/A)  SURGEON:  Surgeon(s) and Role:    * Jonnie Kind, MD - Primary  PHYSICIAN ASSISTANT:   ASSISTANTS: Corrie Dandy RNFA   ANESTHESIA:   spinal  EBL:  Total I/O In: 500 [I.V.:500] Out: 125 [Urine:100; Blood:25]  BLOOD ADMINISTERED:none  DRAINS: Urinary Catheter (Foley)   LOCAL MEDICATIONS USED:  MARCAINE    and Amount: 10 ml  SPECIMEN:  No Specimen  DISPOSITION OF SPECIMEN:  N/A  COUNTS:  YES  TOURNIQUET:  * No tourniquets in log *  DICTATION: .Dragon Dictation  PLAN OF CARE: Admit for overnight observation  PATIENT DISPOSITION:  PACU - hemodynamically stable. overnight observation planned due to lack of family support   Delay start of Pharmacological VTE agent (>24hrs) due to surgical blood loss or risk of bleeding: not applicable Indications: 65 year old female with anal incontinence and loss of control of stool, anal sphincter essentially absent, with large rectocele above it. Details of procedure: Patient was taken operating room prepped and draped under spinal anesthesia for vaginal procedure with antibiotics administered timeout conducted and procedure confirmed by surgical team a vaginal deal was in place and was sewn across the perineal body to isolate the anus from the surgical site in the vagina.. The perineum was grasped at the vaginally introitus, along the lines of the hymen remnants, and 5 and 7:00 and split transversely at the line of the pigmented skin, transversely and then split in the midline a distance approximate 5 cm up the posterior vaginal wall. The vaginal epithelium was dissected free from the underlying connective tissue which will had a high preponderance of fatty tissue 10 cc of Marcaine with epinephrine  had been injected through the vaginal skin in order to infiltrate this area prior to the dissection. The dissection was able be carried laterally and cephalad, that clamps placed in the perineal body could identify rather good support on the left side, and very loose redundant tissue on the right. Efforts to reconstruct the perineal body were best achieved by a causation of the redundant tissue on the patient right side in such a way that could be pulled laterally to the left and cephalad and attached to this the better supportive tissues on the left side. These of 6 or 7 interrupted 0 Vicryl sutures were used to pull the tissues from inside the perineal body across the midline and cephalad and attached them she weighs rebuilt perineal body minute Lee exam of the rectum was performed with a separate sterile glove order to ensure that we were staying well away from the rectum itself The series of horizontal mattress sutures assaulted and a firmer perineal body with slight mobility still present in my and anterior examination of the vagina showed adequate vaginal opening to extend beyond the repair. Once the perineal body been reconstructed with care of mattress sutures, 2 additional sutures were placed at the level of the introitus trying to reconstruct the anal sphincter. A fibrous area of very thin circumferential muscular tissue could be identified on the patient's left side however I could not find the sphincter capsule  on either side as we burrowed into the perineal body trying to identify the anal sphincter remnants. Efforts to Rod Holler the support in the area of the presumed sphincter remnants was performed by placing 2  interrupted sutures from 10:00 to 2:00 reducing the laxity of the perineal body. Even after these 2 stitches, the anal sphincter was quite lax and was essentially nonexistent. The suture area was irrigated again. Particular care and been used throughout the case to place gloves when working  under the vaginal bed to incise suspect the anus, and it was felt that adequate ration of cleaning contaminated areas had been performed. The surgical field was irrigated copiously. Vaginal epithelium was then closed with running 2-0 chromic with adequate tissue its approximation. Then prepped and Foley catheter inserted which will be left overnight. No vaginal packing was considered necessary. Patient to recovery in stable condition.

## 2015-06-29 NOTE — Anesthesia Procedure Notes (Signed)
Procedure Name: MAC Date/Time: 06/29/2015 8:24 AM Performed by: Vista Deck Pre-anesthesia Checklist: Patient identified, Emergency Drugs available, Suction available, Timeout performed and Patient being monitored Patient Re-evaluated:Patient Re-evaluated prior to inductionOxygen Delivery Method: Nasal Cannula    Spinal Patient location during procedure: OR Start time: 06/29/2015 8:34 AM End time: 06/29/2015 8:42 AM Staffing Resident/CRNA: Vista Deck Preanesthetic Checklist Completed: patient identified, site marked, surgical consent, pre-op evaluation, timeout performed, IV checked, risks and benefits discussed and monitors and equipment checked Spinal Block Patient position: right lateral decubitus Prep: Betadine Patient monitoring: heart rate, cardiac monitor, continuous pulse ox and blood pressure Approach: right paramedian Location: L3-4 Injection technique: single-shot Needle Needle type: Spinocan  Needle gauge: 22 G Needle length: 9 cm Assessment Sensory level: T8 Additional Notes ATTEMPTS: 3 TRAY ID: [id number] TRAY EXPIRATION DATE: 2017-08 Clear CSF 31/2 in spinal needle buried to hub for successful block

## 2015-06-29 NOTE — Anesthesia Postprocedure Evaluation (Signed)
Anesthesia Post Note  Patient: [name]  Procedure(s) Performed: Procedure(s) (LRB): POSTERIOR REPAIR (RECTOCELE) (N/A)  Patient location during evaluation: Nursing Unit Anesthesia Type: Spinal Level of consciousness: awake and alert Pain management: pain level controlled Vital Signs Assessment: post-procedure vital signs reviewed and stable Respiratory status: spontaneous breathing and non-rebreather facemask Cardiovascular status: stable Anesthetic complications: no      Last Pain:  Filed Vitals:   06/29/15 1338  PainSc: 0-No pain                 Seraphine Gudiel

## 2015-06-29 NOTE — Transfer of Care (Signed)
Immediate Anesthesia Transfer of Care Note  Patient: [name]  Procedure(s) Performed: Procedure(s): POSTERIOR REPAIR (RECTOCELE) (N/A)  Patient Location: PACU  Anesthesia Type:Spinal  Level of Consciousness: sedated and patient cooperative  Airway & Oxygen Therapy: Patient Spontanous Breathing and Patient connected to nasal cannula oxygen  Post-op Assessment: Report given to RN and Post -op Vital signs reviewed and stable  Post vital signs: Reviewed and stable    Complications: No apparent anesthesia complications T 12

## 2015-06-30 ENCOUNTER — Encounter (HOSPITAL_COMMUNITY): Payer: Self-pay | Admitting: Obstetrics and Gynecology

## 2015-06-30 DIAGNOSIS — G4730 Sleep apnea, unspecified: Secondary | ICD-10-CM | POA: Diagnosis not present

## 2015-06-30 DIAGNOSIS — E039 Hypothyroidism, unspecified: Secondary | ICD-10-CM | POA: Diagnosis not present

## 2015-06-30 DIAGNOSIS — R32 Unspecified urinary incontinence: Secondary | ICD-10-CM | POA: Diagnosis not present

## 2015-06-30 DIAGNOSIS — E119 Type 2 diabetes mellitus without complications: Secondary | ICD-10-CM | POA: Diagnosis not present

## 2015-06-30 DIAGNOSIS — J449 Chronic obstructive pulmonary disease, unspecified: Secondary | ICD-10-CM | POA: Diagnosis not present

## 2015-06-30 DIAGNOSIS — M1991 Primary osteoarthritis, unspecified site: Secondary | ICD-10-CM | POA: Diagnosis not present

## 2015-06-30 DIAGNOSIS — I10 Essential (primary) hypertension: Secondary | ICD-10-CM | POA: Diagnosis not present

## 2015-06-30 DIAGNOSIS — N816 Rectocele: Secondary | ICD-10-CM | POA: Diagnosis not present

## 2015-06-30 DIAGNOSIS — R150 Incomplete defecation: Secondary | ICD-10-CM | POA: Diagnosis not present

## 2015-06-30 LAB — BASIC METABOLIC PANEL
Anion gap: 3 — ABNORMAL LOW (ref 5–15)
BUN: 23 mg/dL — AB (ref 6–20)
CHLORIDE: 113 mmol/L — AB (ref 101–111)
CO2: 27 mmol/L (ref 22–32)
CREATININE: 0.87 mg/dL (ref 0.44–1.00)
Calcium: 8.4 mg/dL — ABNORMAL LOW (ref 8.9–10.3)
GFR calc Af Amer: >60 mL/min (ref >60)
GFR calc non Af Amer: >60 mL/min (ref >60)
Glucose, Bld: 51 mg/dL — ABNORMAL LOW (ref 65–99)
POTASSIUM: 4 mmol/L (ref 3.5–5.1)
Sodium: 143 mmol/L (ref 135–145)

## 2015-06-30 LAB — GLUCOSE, CAPILLARY
GLUCOSE-CAPILLARY: 81 mg/dL (ref 65–99)
Glucose-Capillary: 41 mg/dL — CL (ref 65–99)

## 2015-06-30 LAB — CBC
HEMATOCRIT: 31.1 % — AB (ref 36.0–46.0)
HEMOGLOBIN: 9.6 g/dL — AB (ref 12.0–15.0)
MCH: 23.1 pg — AB (ref 26.0–34.0)
MCHC: 30.9 g/dL (ref 30.0–36.0)
MCV: 74.8 fL — ABNORMAL LOW (ref 78.0–100.0)
Platelets: 187 10*3/uL (ref 150–400)
RBC: 4.16 MIL/uL (ref 3.87–5.11)
RDW: 14.6 % (ref 11.5–15.5)
WBC: 7.1 10*3/uL (ref 4.0–10.5)

## 2015-06-30 MED ORDER — POLYETHYLENE GLYCOL 3350 17 GM/SCOOP PO POWD: 17.0000 g | Freq: Every day | ORAL | Status: DC

## 2015-06-30 MED ORDER — CIPROFLOXACIN HCL 500 MG PO TABS: 500.0000 mg | ORAL_TABLET | Freq: Two times a day (BID) | ORAL | Status: DC

## 2015-06-30 MED ORDER — TRAMADOL HCL 50 MG PO TABS: 50.0000 mg | ORAL_TABLET | Freq: Four times a day (QID) | ORAL | Status: DC | PRN

## 2015-06-30 MED ORDER — OXYCODONE-ACETAMINOPHEN 5-325 MG PO TABS: 1.0000 | ORAL_TABLET | ORAL | Status: DC | PRN

## 2015-06-30 NOTE — Care Management Obs Status (Signed)
MEDICARE OBSERVATION STATUS NOTIFICATION   Patient Details  Name: [name] MRN: [id number] Date of Birth: 18-Feb-1951   Medicare Observation Status Notification Given:   (Less than 24 hours in OBS)    Alvie Heidelberg, RN 06/30/2015, 11:08 AM

## 2015-06-30 NOTE — Discharge Instructions (Signed)
Take stool softener 1-2 times daily. Take your temperature daily , notify us of any fevers, or vomiting, or increasing pain.

## 2015-06-30 NOTE — Anesthesia Postprocedure Evaluation (Signed)
Anesthesia Post Note  Patient: [name]  Procedure(s) Performed: Procedure(s) (LRB): POSTERIOR REPAIR (RECTOCELE) (N/A)  Patient location during evaluation: Nursing Unit Anesthesia Type: Spinal Level of consciousness: awake and alert and oriented Pain management: pain level controlled Vital Signs Assessment: post-procedure vital signs reviewed and stable Respiratory status: spontaneous breathing Cardiovascular status: blood pressure returned to baseline Postop Assessment: no signs of nausea or vomiting Anesthetic complications: no    Last Vitals:  Filed Vitals:   06/30/15 0200 06/30/15 0635  BP: 136/78 137/61  Pulse: 80 58  Temp: 36.2 C 36.9 C  Resp: 20 20    Last Pain:  Filed Vitals:   06/30/15 0635  PainSc: Asleep                 Marlea Gambill

## 2015-06-30 NOTE — Discharge Summary (Signed)
Physician Discharge Summary  Patient ID: [name] MRN: [id number] DOB/AGE: 05/03/1950 65 y.o.  Admit date: 06/29/2015 Discharge date: 06/30/2015  Admission Diagnoses:rectocele, anal incontinence  Discharge Diagnoses: rectocele, anal incontinence  Active Problems:   Rectocele   Discharged Condition: stable  Hospital Course: Patient was admitted for posterior repair, which was done in standard fashion. The anal sphincter was essentially nonexistent, and limited efforts at plication made only slight difference. It was important not to overcorrect this.  Pt was observed overnight , remained alert, with good cbg control.  Consults: None  Significant Diagnostic Studies: labs:  CBC Latest Ref Rng 06/30/2015 06/23/2015 05/31/2015  WBC 4.0 - 10.5 K/uL 7.1 6.2 6.8  Hemoglobin 12.0 - 15.0 g/dL 9.6(L) 10.8(L) 11.0(L)  Hematocrit 36.0 - 46.0 % 31.1(L) 35.4(L) 36.5  Platelets 150 - 400 K/uL 187 233 242      Treatments: surgery: posterior repair  Discharge Exam: Blood pressure 143/52, pulse 68, temperature 99.4 F (37.4 C), temperature source Oral, resp. rate 20, height '5\' 1"'$  (1.549 m), weight 154 lb (69.854 kg), SpO2 100 %. General appearance: alert, cooperative, appears stated age and no distress Head: Normocephalic, without obvious abnormality, atraumatic Resp: clear to auscultation bilaterally GI: soft, non-tender; bowel sounds normal; no masses,  no organomegaly Extremities: extremities normal, atraumatic, no cyanosis or edema and Homans sign is negative, no sign of DVT  Disposition: 01-Home or Self Care     Medication List    ASK your doctor about these medications        ACCU-CHEK FASTCLIX LANCETS Misc  Use to test blood sugar 4 times daily. Dx: E10.65     ACCU-CHEK NANO SMARTVIEW w/Device Kit  1 each by Does not apply route daily. Dx: E10.65     ACCU-CHEK SMARTVIEW CONTROL Liqd  1 each by Other route as needed.     albuterol 108 (90 Base) MCG/ACT inhaler  Commonly  known as:  PROVENTIL HFA;VENTOLIN HFA  Inhale 1 puff into the lungs 3 (three) times daily.     amLODipine 5 MG tablet  Commonly known as:  NORVASC  TAKE 1 TABLET BY MOUTH ONCE DAILY.     aspirin EC 81 MG tablet  Take 81 mg by mouth daily.     B-D SINGLE USE SWABS REGULAR Pads  Use for injections and glucose testing 8 times daily. Dx: E10.65     BD PEN NEEDLE NANO U/F 32G X 4 MM Misc  Generic drug:  Insulin Pen Needle  USE FOUR TIMES DAILY     clobetasol cream 0.05 %  Commonly known as:  TEMOVATE  Apply 1 application topically daily.     cloNIDine 0.3 MG tablet  Commonly known as:  CATAPRES  TAKE 1 TABLET BY MOUTH EVERY EIGHT HOURS. ONCE AT 8AM, 4PM, AND 12 MIDNIGHT.     D 5000 5000 units capsule  Generic drug:  Cholecalciferol  Take 5,000 Units by mouth daily.     diclofenac sodium 1 % Gel  Commonly known as:  VOLTAREN  Apply 2 g topically daily as needed (Pain).     dicyclomine 10 MG capsule  Commonly known as:  BENTYL  TAKE 1 CAPSULE BY MOUTH THREE TIMES DAILY BEFORE MEALS.     glucose blood test strip  Commonly known as:  ACCU-CHEK SMARTVIEW  Use to test blood sugar 4 times daily as instructed. Dx: E10.65     HYDROcodone-acetaminophen 5-325 MG tablet  Commonly known as:  NORCO  Take 1 tablet by mouth every 6 (  six) hours as needed for moderate pain.     hydroxychloroquine 200 MG tablet  Commonly known as:  PLAQUENIL  Take 200 mg by mouth daily.     insulin aspart 100 UNIT/ML FlexPen  Commonly known as:  NOVOLOG FLEXPEN  Inject 14-18 Units into the skin 3 (three) times daily with meals.     Insulin Glargine 300 UNIT/ML Sopn  Commonly known as:  TOUJEO SOLOSTAR  Inject 35 Units into the skin at bedtime.     lamoTRIgine 100 MG tablet  Commonly known as:  LAMICTAL  TAKE 1 TABLET BY MOUTH TWICE DAILY.     levothyroxine 50 MCG tablet  Commonly known as:  SYNTHROID, LEVOTHROID  TAKE 1 TABLET BY MOUTH ONCE DAILY AND 1/2 TABLET ON SUNDAYS.     LORazepam 0.5  MG tablet  Commonly known as:  ATIVAN  Take 1 tablet (0.5 mg total) by mouth 3 (three) times daily.     methocarbamol 500 MG tablet  Commonly known as:  ROBAXIN  Take 500 mg by mouth daily.     metoprolol 50 MG tablet  Commonly known as:  LOPRESSOR  Take 1 tablet (50 mg total) by mouth 2 (two) times daily. <PLEASE MAKE APPOINTMENT FOR REFILLS>     montelukast 10 MG tablet  Commonly known as:  SINGULAIR  TAKE ONE TABLET BY MOUTH ONCE DAILY.     [place] 100000 UNIT/GM Powd  APPLY TO AFFECTED AREA 4 TIMES DAILY.     ONE-A-DAY 50 PLUS PO  Take 1 tablet by mouth daily.     polyethylene glycol powder powder  Commonly known as:  MIRALAX  Take 17 g by mouth daily. To prevent constipation     pregabalin 75 MG capsule  Commonly known as:  LYRICA  Take 75 mg by mouth 2 (two) times daily.     RABEprazole 20 MG tablet  Commonly known as:  ACIPHEX  TAKE 1 TABLET BY MOUTH TWICE DAILY.     RESTASIS 0.05 % ophthalmic emulsion  Generic drug:  cycloSPORINE  Place 1 drop into both eyes 2 (two) times daily.     risperiDONE 0.5 MG tablet  Commonly known as:  RISPERDAL  Take 1 tablet (0.5 mg total) by mouth at bedtime.     rosuvastatin 5 MG tablet  Commonly known as:  CRESTOR  TAKE 1 TABLET BY MOUTH AT BEDTIME.     sertraline 100 MG tablet  Commonly known as:  ZOLOFT  Take 2 tablets (200 mg total) by mouth daily.     traZODone 150 MG tablet  Commonly known as:  DESYREL  Take 2 tablets (300 mg total) by mouth at bedtime.     vitamin E 400 UNIT capsule  Take 400 Units by mouth daily.           Follow-up Information    Follow up with Jonnie Kind, MD. Go in 1 week.   Specialties:  Obstetrics and Gynecology, Radiology   Why:  Postoperative visit   Contact information:   [place] [address] [address] [phone]       Signed: Jonnie Kind 06/30/2015, 6:34 AM

## 2015-06-30 NOTE — Care Management Note (Signed)
Case Management Note  Patient Details  Name: ALYSSON RESSLER MRN: [id number] Date of Birth: [birth year]  Subjective/Objective:    Spoke with patient who is alert from home with CAP program and [facility] home health care. Pateitn stated that she has walker and Wheelchair. Niece will pick up today. No other CM needs identified.              Action/Plan: Home with HH.   Expected Discharge Date:                  Expected Discharge Plan:  Home/Self Care  In-House Referral:     Discharge planning Services  CM Consult  Post Acute Care Choice:    Choice offered to:     DME Arranged:    DME Agency:     HH Arranged:    [place] Agency:     Status of Service:  Completed, signed off  Medicare Important Message Given:    Date Medicare IM Given:    Medicare IM give by:    Date Additional Medicare IM Given:    Additional Medicare Important Message give by:     If discussed at [place] of Stay Meetings, dates discussed:    Additional Comments:  Alvie Heidelberg, RN 06/30/2015, 9:15 AM

## 2015-06-30 NOTE — Addendum Note (Signed)
Addendum  created 06/30/15 1201 by Ollen Bowl, CRNA   Modules edited: Clinical Notes   Clinical Notes:  File: [id number]

## 2015-07-01 ENCOUNTER — Ambulatory Visit: Payer: Self-pay | Admitting: Internal Medicine

## 2015-07-02 ENCOUNTER — Other Ambulatory Visit: Payer: Self-pay | Admitting: *Deleted

## 2015-07-02 NOTE — Patient Outreach (Signed)
Call to patient Patient had her rectocele repair this week, states she is on bed rest and lifting restrictions until she sees the surgeon for f/u appointment next week 07/07/15. She states she has some pain but has pain medication and she is tolerating pain well. She continues to have [facility] nurse for medication management She has CAP aide daily.  Plan to make home visit next week. Royetta Crochet. Laymond Purser, RN, BSN, [place] [phone]

## 2015-07-05 ENCOUNTER — Encounter: Payer: Self-pay | Admitting: *Deleted

## 2015-07-05 ENCOUNTER — Other Ambulatory Visit: Payer: Self-pay | Admitting: Internal Medicine

## 2015-07-05 ENCOUNTER — Other Ambulatory Visit: Payer: Self-pay | Admitting: *Deleted

## 2015-07-05 VITALS — BP 100/60 | HR 58 | Resp 20

## 2015-07-05 DIAGNOSIS — Z79899 Other long term (current) drug therapy: Secondary | ICD-10-CM

## 2015-07-05 DIAGNOSIS — E118 Type 2 diabetes mellitus with unspecified complications: Secondary | ICD-10-CM

## 2015-07-05 NOTE — Telephone Encounter (Signed)
Rx(s) sent to pharmacy electronically.  

## 2015-07-05 NOTE — Patient Outreach (Signed)
[facility]) Care Management   07/05/2015  Ashaki Frosch Landini 09/07/1950 [id number]  Chaslyn Eisen Lau is an 65 y.o. female  Co-visit with Londell Moh, patient friend from her old apartment  Subjective:  "I am having pain in my vagina and rectum" "I had an episode at my foot MD appointment, reports blood sugar dropped to 30s. States her endocrinologist decreased her insulin dosage. Patient still has [place] RN filling medbox, scheduled to be there tomorrow, requested they document BP reading as BP was low per RNCM cuff at today's visit.   Objective:   BP 100/60 mmHg  Pulse 58  Resp 20  SpO2 98%  LMP  (Exact Date) Review of Systems  Respiratory: Negative.   Musculoskeletal: Negative.   Psychiatric/Behavioral:       Hx of depression, sees therapist for counseling and medication management    Physical Exam  Constitutional: She is oriented to person, place, and time. She appears well-developed.  Cardiovascular: Regular rhythm.   GI: Soft.  Genitourinary:  Recent rectocele repair surgery  Neurological: She is alert and oriented to person, place, and time.    Encounter Medications:   Outpatient Encounter Prescriptions as of 07/05/2015  Medication Sig Note  . ACCU-CHEK FASTCLIX LANCETS MISC Use to test blood sugar 4 times daily. Dx: E10.65   . albuterol (PROVENTIL HFA;VENTOLIN HFA) 108 (90 BASE) MCG/ACT inhaler Inhale 1 puff into the lungs 3 (three) times daily.   . Alcohol Swabs (B-D SINGLE USE SWABS REGULAR) PADS Use for injections and glucose testing 8 times daily. Dx: E10.65   . amLODipine (NORVASC) 5 MG tablet TAKE 1 TABLET BY MOUTH ONCE DAILY.   [name] aspirin EC 81 MG tablet Take 81 mg by mouth daily.   . BD PEN NEEDLE NANO U/F 32G X 4 MM MISC USE FOUR TIMES DAILY   . Blood Glucose Calibration (ACCU-CHEK SMARTVIEW CONTROL) LIQD 1 each by Other route as needed.   . Blood Glucose Monitoring Suppl (ACCU-CHEK NANO SMARTVIEW) W/DEVICE KIT 1 each by Does not apply route  daily. Dx: E10.65   . Cholecalciferol (D 5000) 5000 UNITS capsule Take 5,000 Units by mouth daily.   . ciprofloxacin (CIPRO) 500 MG tablet Take 1 tablet (500 mg total) by mouth 2 (two) times daily.   . clobetasol cream (TEMOVATE) 0.73 % Apply 1 application topically daily.   . cloNIDine (CATAPRES) 0.3 MG tablet TAKE 1 TABLET BY MOUTH EVERY EIGHT HOURS. ONCE AT 8AM, 4PM, AND 12 MIDNIGHT.   [name] diclofenac sodium (VOLTAREN) 1 % GEL Apply 2 g topically daily as needed (Pain).   [name] dicyclomine (BENTYL) 10 MG capsule TAKE 1 CAPSULE BY MOUTH THREE TIMES DAILY BEFORE MEALS.   . glucose blood (ACCU-CHEK SMARTVIEW) test strip Use to test blood sugar 4 times daily as instructed. Dx: E10.65   . HYDROcodone-acetaminophen (NORCO) 5-325 MG tablet Take 1 tablet by mouth every 6 (six) hours as needed for moderate pain.   . hydroxychloroquine (PLAQUENIL) 200 MG tablet Take 200 mg by mouth daily.   . insulin aspart (NOVOLOG FLEXPEN) 100 UNIT/ML FlexPen Inject 14-18 Units into the skin 3 (three) times daily with meals. 07/05/2015: Recently decreased to 10u small meals and 14u larger meals  . Insulin Glargine (TOUJEO SOLOSTAR) 300 UNIT/ML SOPN Inject 35 Units into the skin at bedtime. 07/05/2015: Decreased to 25 u   . lamoTRIgine (LAMICTAL) 100 MG tablet TAKE 1 TABLET BY MOUTH TWICE DAILY.   [name] levothyroxine (SYNTHROID, LEVOTHROID) 50 MCG tablet TAKE 1 TABLET  BY MOUTH ONCE DAILY AND 1/2 TABLET ON SUNDAYS.   [name] LORazepam (ATIVAN) 0.5 MG tablet Take 1 tablet (0.5 mg total) by mouth 3 (three) times daily.   . methocarbamol (ROBAXIN) 500 MG tablet Take 500 mg by mouth daily.   . metoprolol (LOPRESSOR) 50 MG tablet Take 1 tablet (50 mg total) by mouth 2 (two) times daily. <PLEASE MAKE APPOINTMENT FOR REFILLS>   . montelukast (SINGULAIR) 10 MG tablet TAKE ONE TABLET BY MOUTH ONCE DAILY.   . Multiple Vitamins-Minerals (ONE-A-DAY 50 PLUS PO) Take 1 tablet by mouth daily.   [name] Nystatin (NYAMYC) 100000 UNIT/GM POWD APPLY TO AFFECTED  AREA 4 TIMES DAILY.   [name] oxyCODONE-acetaminophen (PERCOCET/ROXICET) 5-325 MG tablet Take 1-2 tablets by mouth every 4 (four) hours as needed for severe pain (moderate to severe pain (when tolerating fluids)).   [name] polyethylene glycol powder (MIRALAX) powder Take 17 g by mouth daily. To prevent constipation   . polyethylene glycol powder (MIRALAX) powder Take 17 g by mouth daily. To prevent constipation   . pregabalin (LYRICA) 75 MG capsule Take 75 mg by mouth 2 (two) times daily.    . RABEprazole (ACIPHEX) 20 MG tablet TAKE 1 TABLET BY MOUTH TWICE DAILY.   [name] RESTASIS 0.05 % ophthalmic emulsion Place 1 drop into both eyes 2 (two) times daily.    . risperiDONE (RISPERDAL) 0.5 MG tablet Take 1 tablet (0.5 mg total) by mouth at bedtime.   . rosuvastatin (CRESTOR) 5 MG tablet TAKE 1 TABLET BY MOUTH AT BEDTIME.   [name] sertraline (ZOLOFT) 100 MG tablet Take 2 tablets (200 mg total) by mouth daily.   . traMADol (ULTRAM) 50 MG tablet Take 1 tablet (50 mg total) by mouth every 6 (six) hours as needed for moderate pain or severe pain.   . traZODone (DESYREL) 150 MG tablet Take 2 tablets (300 mg total) by mouth at bedtime.   . vitamin E 400 UNIT capsule Take 400 Units by mouth daily.    No facility-administered encounter medications on file as of 07/05/2015.    Functional Status:   In your present state of health, do you have any difficulty performing the following activities: 06/29/2015 06/23/2015  Hearing? N N  Vision? N -  Difficulty concentrating or making decisions? N N  Walking or climbing stairs? Y Y  Dressing or bathing? N N  Doing errands, shopping? Y Y  Preparing Food and eating ? - -  Using the Toilet? - -  In the past six months, have you accidently leaked urine? - -  Do you have problems with loss of bowel control? - -  Managing your Medications? - -  Managing your Finances? - -  Housekeeping or managing your Housekeeping? - -    Fall/Depression Screening:    PHQ 2/9 Scores 04/21/2015  03/19/2015 02/10/2015  PHQ - 2 Score 1 2 0  PHQ- 9 Score - 9 -    Assessment:   Diabetes-had an episode of low cbg at an MD appointment. Gave and reviewed diabetes education folder, encouraged keeping blood sugar log, reviewed average cbg readings.  Recent surgery-patient on bedrest until sees MD on Wednesday, has CAP aide and a friend assisting her   Hypotension-Reviewed symptoms to report to MD, requested that she document BP from [facility] visit tomorrow for [name] to report to MD  Plan:  [place] Problem One        Most Recent Value   Care Plan Problem One  Diabetes-knowledge deficit  Role Documenting the Problem One  Care Management Coordinator   Care Plan for Problem One  Active   THN Long Term Goal (31-90 days)  Patient will be able to verbalize signs and symptoms of low blood sugar within the next 21 days    THN Long Term Goal Start Date  07/05/15 [restarted goal]   Interventions for Problem One Long Term Goal  Gave diabetes educational folder, reviewed low blood sugar treatement, reviewed CBG averages   THN CM Short Term Goal #1 (0-30 days)  Patient will monitor and record CBGs on her log and take to endocrinologist appoointments    THN CM Short Term Goal #1 Start Date  07/05/15   Interventions for Short Term Goal #1  Encouraged continuing blood sugar documentation on log    THN CM Care Plan Problem Two        Most Recent Value   Care Plan Problem Two  High risk for fall as evidenced by falls over past year   Role Documenting the Problem Two  Care Management [place] for Problem Two  Active   Interventions for Problem Two Long Term Goal   no falls reported but had a "shaky spell"   THN Long Term Goal (31-90) days  Patient will not have any falls over the next 31 days   THN Long Term Goal Start Date  04/21/15   THN CM Short Term Goal #1 (0-30 days)  Patient will continue with her PT and OT services over the nex 21 days   THN CM Short Term Goal #1 Start Date   05/19/15   [facility] CM Short Term Goal #1 Met Date   07/05/15   Interventions for Short Term Goal #2   therapy completed     Report BP to PCP Requested patient have [place] nurse document BP, so we can report to PCP  [facility] E. Laymond Purser, RN, BSN, [place] [phone]

## 2015-07-06 ENCOUNTER — Other Ambulatory Visit: Payer: Self-pay | Admitting: *Deleted

## 2015-07-06 ENCOUNTER — Telehealth: Payer: Self-pay | Admitting: Internal Medicine

## 2015-07-06 DIAGNOSIS — J440 Chronic obstructive pulmonary disease with acute lower respiratory infection: Secondary | ICD-10-CM | POA: Diagnosis not present

## 2015-07-06 NOTE — Telephone Encounter (Signed)
Called pt and left a detailed message advising her per Dr Arman Filter message below.

## 2015-07-06 NOTE — Telephone Encounter (Signed)
Please read message below from [facility] and advise.

## 2015-07-06 NOTE — Patient Outreach (Signed)
Call to patient to follow up on BP reading by [facility] RN Patient reports "it was good today", gave [facility] telephone number for [name] [phone] to call to get the reading.  Call to [facility] nurse at [facility], BP reading was 130/64.  Will route BP reading to MD. Will visit patient in May Thamara Leger E. Laymond Purser, RN, BSN, [place] [phone]

## 2015-07-06 NOTE — Telephone Encounter (Signed)
Can you please find out her sugar values and the exact regimen of insulin she is on right now. 83 is not low or high.Marland KitchenMarland KitchenMarland Kitchen

## 2015-07-06 NOTE — Telephone Encounter (Signed)
Called pt to ask her sugar values. Pt stated she's had some high's for the month of April (200's). Pt stated that her sugars have decreased since having surgery on the 18th. Yesterday AM it was 201, but then after lunch it was 82 and after dinner it was 85. This AM it was 118. Pt stated that when her sugar levels drop below 90 that is when she starts feeling confused and sleepy. Her CNA was there and she said that pt had had some highs but mostly normal to low since her surgery. Pt is doing 25 units of Toujeo units at bedtime.

## 2015-07-06 NOTE — Telephone Encounter (Signed)
Per last dose change:        Philemon Kingdom, MD at 06/22/2015 5:02 PM     Status: Signed       Expand All Collapse All   Please decrease - Toujeo 25 >> 22 units at bedtime - Novolog:  10 units with a smaller meal (take no more than this when going out after that meal)  14 >> 12 units with a larger meal       Please ask her to decrease Toujeo as above. I am not sure if she is actually using the Novolog as above.Marland KitchenMarland Kitchen

## 2015-07-06 NOTE — Telephone Encounter (Signed)
[facility] Note Dated 07/05/15 5:12 PM Caller states her BS is 41 and she is having confusion, she is concerned because she reduced her insulin and it's been running a little high esp in AM

## 2015-07-07 ENCOUNTER — Ambulatory Visit (INDEPENDENT_AMBULATORY_CARE_PROVIDER_SITE_OTHER): Payer: Commercial Managed Care - HMO | Admitting: Obstetrics and Gynecology

## 2015-07-07 ENCOUNTER — Encounter: Payer: Self-pay | Admitting: Obstetrics and Gynecology

## 2015-07-07 ENCOUNTER — Other Ambulatory Visit: Payer: Self-pay | Admitting: Internal Medicine

## 2015-07-07 VITALS — BP 140/60 | Ht 61.0 in | Wt 157.0 lb

## 2015-07-07 DIAGNOSIS — M79609 Pain in unspecified limb: Secondary | ICD-10-CM | POA: Diagnosis not present

## 2015-07-07 DIAGNOSIS — N952 Postmenopausal atrophic vaginitis: Secondary | ICD-10-CM

## 2015-07-07 MED ORDER — ESTROGENS, CONJUGATED 0.625 MG/GM VA CREA: 1.0000 | TOPICAL_CREAM | VAGINAL | Status: DC

## 2015-07-07 NOTE — Progress Notes (Signed)
Patient ID: [name], female   DOB: 25-Feb-1951, 65 y.o.   MRN: [id number] Pt here today for first post op visit. Pt is one week out from surgery and states that she is still having pain and soreness, and some spotting at times.

## 2015-07-07 NOTE — Progress Notes (Signed)
Patient ID: [name], female   DOB: November 15, 1950, 65 y.o.   MRN: [id number] Subjective:  [name] is a 65 y.o. female now 1 weeks status post posterior rectocele.   Pt last BM was 3 days ago and she has been taking the Rx stool softeners. Pt notes that since the surgery, there has not been much difference when trying to have bowel movements.   Review of Systems Negative except soreness and spotting   Diet:   nl   Bowel movements : abnormal with constipation.  The patient is not having any pain.  Objective:  BP 140/60 mmHg  Ht 5\' 1"  (1.549 m)  Wt 157 lb (71.215 kg)  BMI 29.68 kg/m2  LMP  (Exact Date) General:Well developed, well nourished.  No acute distress. Abdomen: Bowel sounds normal, soft, non-tender. Pelvic Exam:    External Genitalia:  Normal.    Vagina: light bleeding, considered normal. Good support.     Cervix: Normal    Uterus: Normal    Adnexa/Bimanual: Normal Rectal  Good anterior support. Incision(s):   Healing well, no drainage, no erythema, no hernia, no swelling, no dehiscence, appropriate bruising.     Assessment:  Post-Op 1 weeks s/p posterior rectocele    Doing well postoperatively.   Plan:  1.Wound care discussed   2. . current medications. premarin vaginal cream Rx 3. Activity restrictions: no lifting more than 25 pounds 4. return to work: not applicable. 5. Follow up in 4 week. For  Final post-op  By signing my name below, I, [name], attest that this documentation has been prepared under the direction and in the presence of Jonnie Kind, MD. Electronically Signed: Soijett [name], ED Scribe. 07/07/2015. 10:24 AM.  I personally performed the services described in this documentation, which was SCRIBED in my presence. The recorded information has been reviewed and considered accurate. It has been edited as necessary during review. Jonnie Kind, MD

## 2015-07-09 ENCOUNTER — Telehealth: Payer: Self-pay | Admitting: Family Medicine

## 2015-07-09 NOTE — Telephone Encounter (Signed)
Patient is asking if she could be referred to Dr. Denna Haggard for scalp and skin issues, please advise?

## 2015-07-12 ENCOUNTER — Telehealth: Payer: Self-pay

## 2015-07-12 NOTE — Telephone Encounter (Signed)
Patient states when she was having her recent surgery- she was told that she had a "bad lung". I advised her to call pulmonology but she states she wanted to hear your opinion on why the physician would tell her that and what was wrong with her lung? Please advise

## 2015-07-12 NOTE — Telephone Encounter (Signed)
Didn't start smoking until she was 25 and a pack would last her 2-3 days and she quit for 17 years and started back a few years ago so doesn't think she has smoked that much but she does want a rx for chantix because she does still smoke and a pack will last her 2-3 days

## 2015-07-12 NOTE — Telephone Encounter (Signed)
She Will need to check with her psych as to safety of chantix, not recommended for people being treated for mental health illness generally Let her know of free nicotine replacement through mental health,

## 2015-07-12 NOTE — Telephone Encounter (Signed)
Most recent CXR does not report fluid or mass in lungs, so that's good. Pls verify that she does have a 30 pack year smoking history, 1 PPD for 30 years or 2 PPD for 15 years, if she does needs low dose chest screening for lung cancer, has never had one ??? pls ask Let her know I am thankful she did well with surgery The fact that she has a long smoking history will  negatively affect her lung function  So maybe that was what the comment was about, may ask Doc who told her to further explain also

## 2015-07-12 NOTE — Telephone Encounter (Signed)
Patient doesn't need actual referral but needs a [facility] referral entered for Dr. Denna Haggard.  Appointment is 07/20/2015 and 08/16/2015.

## 2015-07-13 NOTE — Telephone Encounter (Signed)
[facility] Referral Has been completed

## 2015-07-14 IMAGING — CR DG HIP COMPLETE 2+V*R*
3 series · 3 of 3 positions shown · non-contrast
Comparison: None.

CLINICAL DATA: Acute right hip pain after fall in the tub.

EXAM:
RIGHT HIP - COMPLETE 2+ VIEW

[view not recorded (1 of 3)]
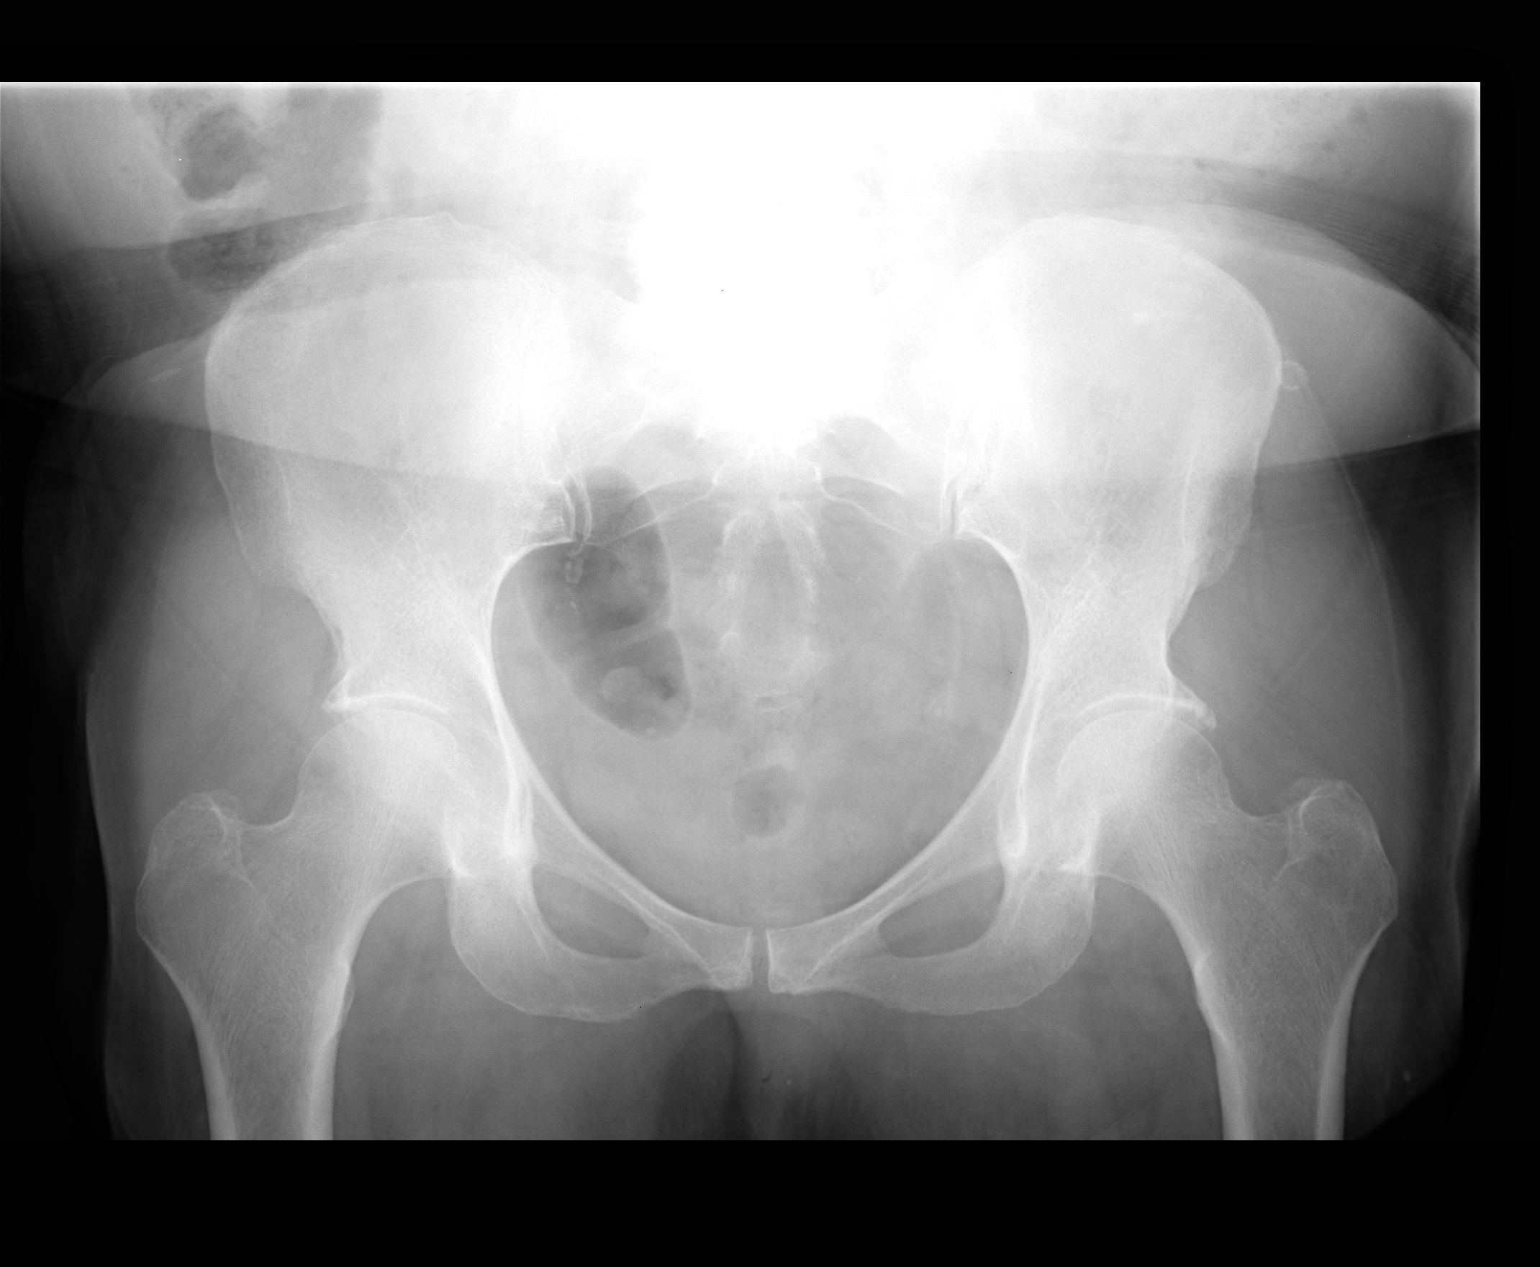

[view not recorded (2 of 3)]
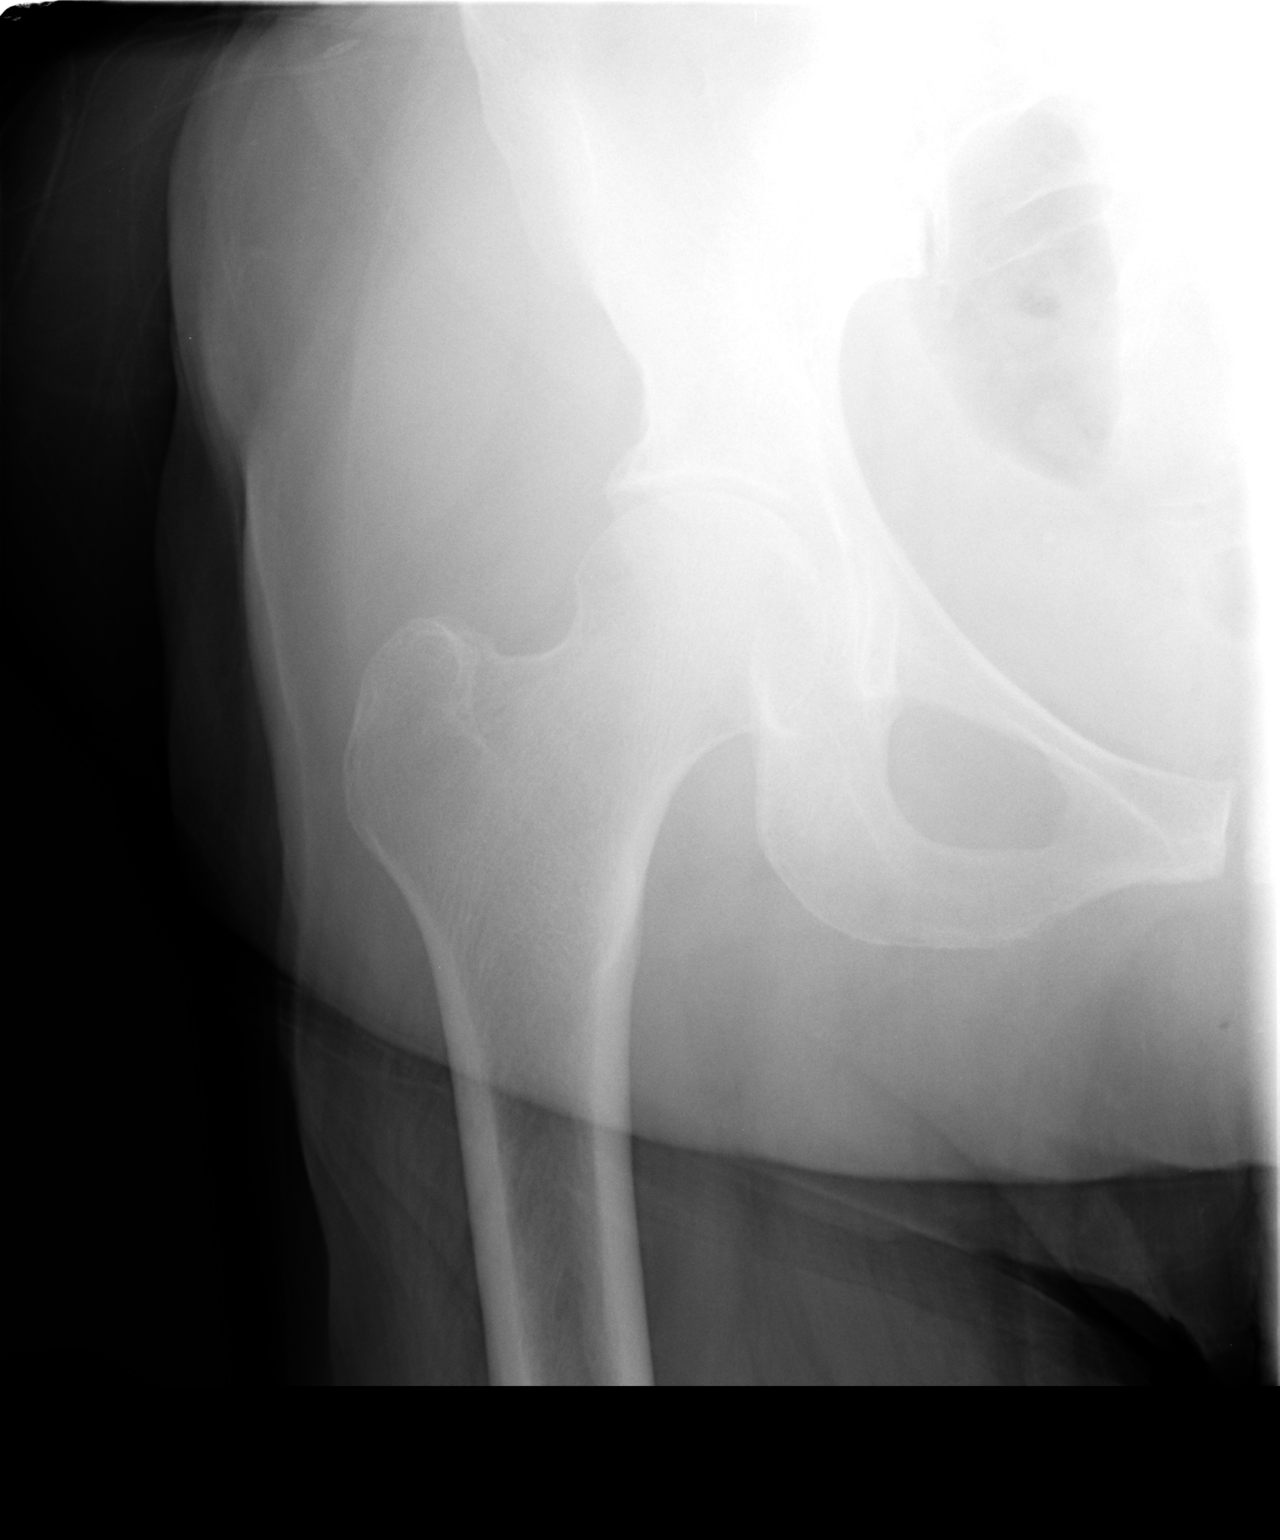

[view not recorded (3 of 3)]
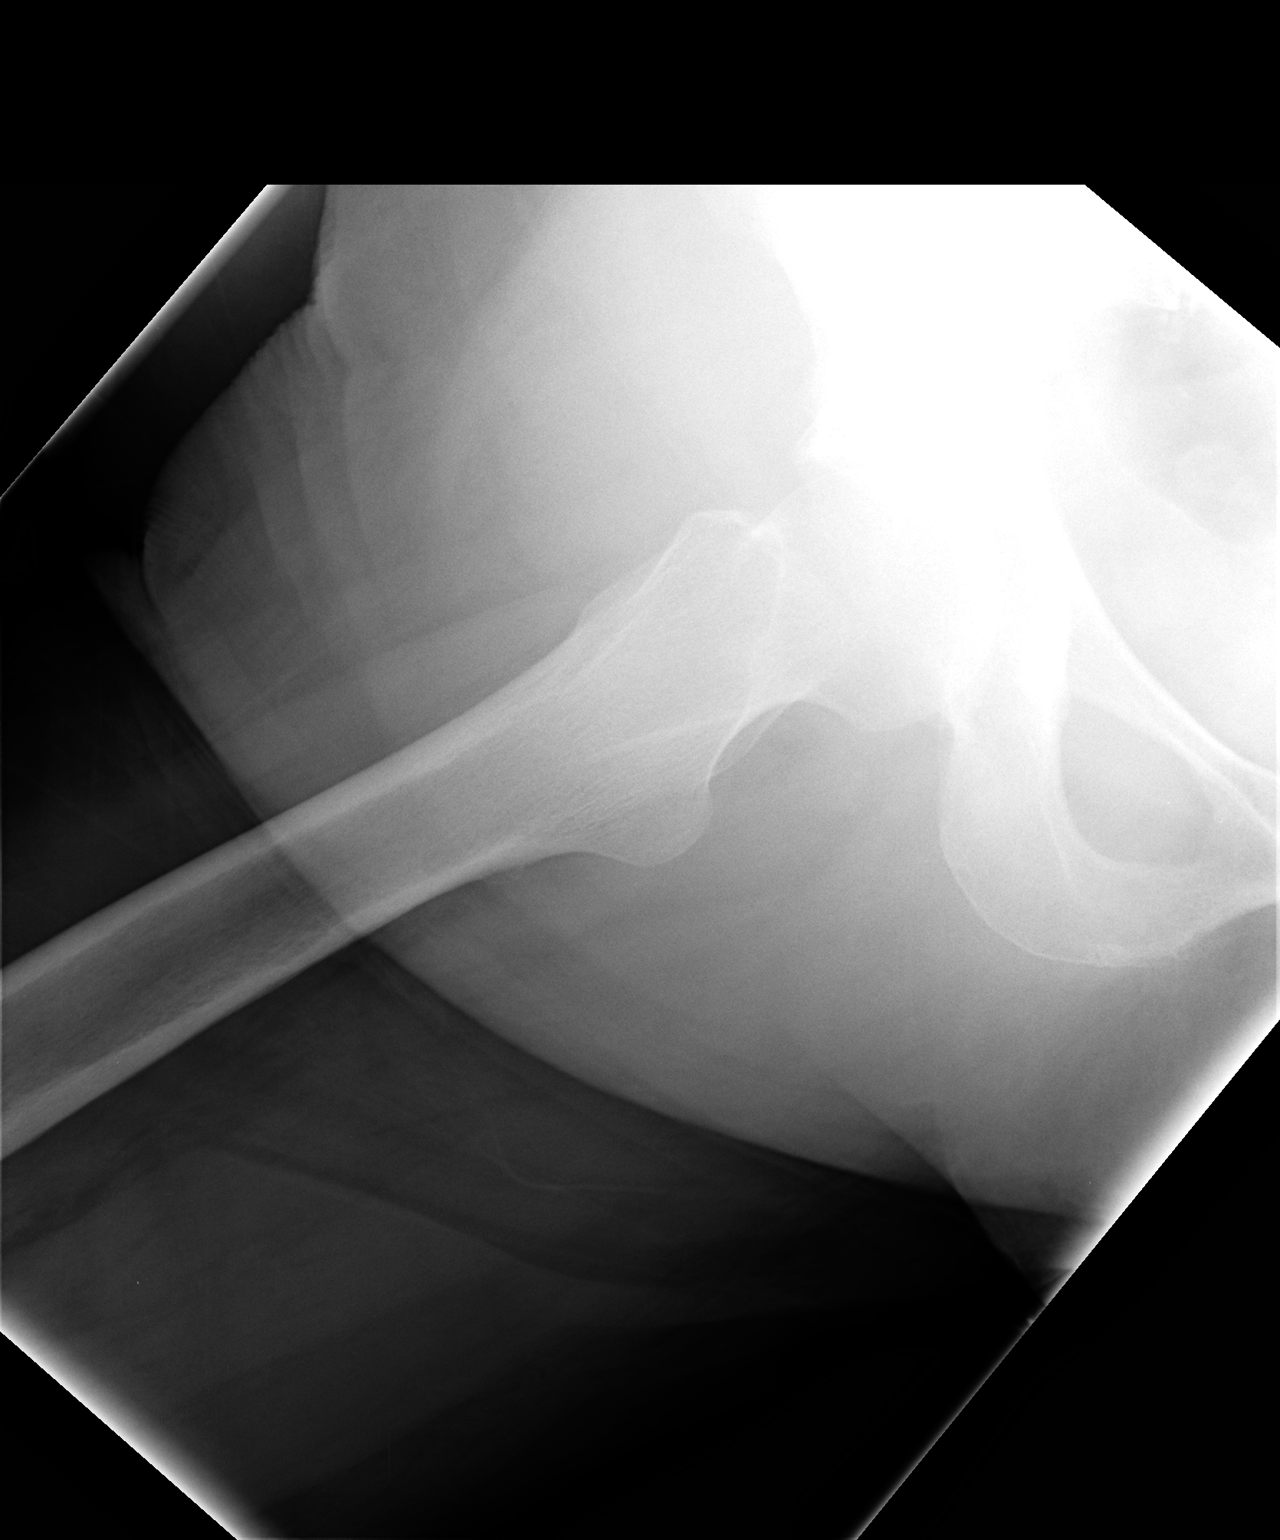

[3 of 3 positions shown; findings below may reference images not displayed]

FINDINGS: There is no evidence of hip fracture or dislocation. There is no
evidence of arthropathy or other focal bone abnormality.
IMPRESSION: Normal right hip.

## 2015-07-15 ENCOUNTER — Telehealth: Payer: Self-pay

## 2015-07-15 ENCOUNTER — Encounter: Payer: Self-pay | Admitting: Family Medicine

## 2015-07-15 ENCOUNTER — Ambulatory Visit (HOSPITAL_COMMUNITY): Payer: Self-pay | Admitting: Psychiatry

## 2015-07-15 ENCOUNTER — Ambulatory Visit (INDEPENDENT_AMBULATORY_CARE_PROVIDER_SITE_OTHER): Payer: Commercial Managed Care - HMO | Admitting: Family Medicine

## 2015-07-15 ENCOUNTER — Other Ambulatory Visit: Payer: Self-pay

## 2015-07-15 VITALS — BP 130/70 | HR 60 | Resp 18 | Ht 61.0 in | Wt 157.0 lb

## 2015-07-15 DIAGNOSIS — I69354 Hemiplegia and hemiparesis following cerebral infarction affecting left non-dominant side: Secondary | ICD-10-CM | POA: Diagnosis not present

## 2015-07-15 DIAGNOSIS — R110 Nausea: Secondary | ICD-10-CM

## 2015-07-15 DIAGNOSIS — E1165 Type 2 diabetes mellitus with hyperglycemia: Secondary | ICD-10-CM

## 2015-07-15 DIAGNOSIS — E119 Type 2 diabetes mellitus without complications: Secondary | ICD-10-CM | POA: Diagnosis not present

## 2015-07-15 DIAGNOSIS — Z8679 Personal history of other diseases of the circulatory system: Secondary | ICD-10-CM

## 2015-07-15 DIAGNOSIS — E1159 Type 2 diabetes mellitus with other circulatory complications: Secondary | ICD-10-CM

## 2015-07-15 DIAGNOSIS — J440 Chronic obstructive pulmonary disease with acute lower respiratory infection: Secondary | ICD-10-CM | POA: Diagnosis not present

## 2015-07-15 DIAGNOSIS — I10 Essential (primary) hypertension: Secondary | ICD-10-CM | POA: Diagnosis not present

## 2015-07-15 DIAGNOSIS — R42 Dizziness and giddiness: Secondary | ICD-10-CM | POA: Diagnosis not present

## 2015-07-15 DIAGNOSIS — Z87898 Personal history of other specified conditions: Secondary | ICD-10-CM

## 2015-07-15 LAB — GLUCOSE, POCT (MANUAL RESULT ENTRY): POC GLUCOSE: 135 mg/dL — AB (ref 70–99)

## 2015-07-15 MED ORDER — ONDANSETRON HCL 4 MG/2ML IJ SOLN
4.0000 mg | Freq: Once | INTRAMUSCULAR | Status: AC
  Administered 2015-07-15: 4 mg via INTRAMUSCULAR

## 2015-07-15 MED ORDER — MECLIZINE HCL 12.5 MG PO TABS: 12.5000 mg | ORAL_TABLET | Freq: Three times a day (TID) | ORAL | Status: DC | PRN

## 2015-07-15 NOTE — Progress Notes (Signed)
Subjective:    Patient ID: [name], female    DOB: 1950-10-05, 65 y.o.   MRN: [id number]  HPI Pt in with main concern re elevated blood pressure, she had called in reporting elevated and uncontroled blood pressure. Initial plan was nurse BP check, however, she c/o nurse of nausea, dizziness and palpitations.Denies chest pain, does have chronic fatigue. Recently changed to new cardiologist as she complained that she did not  Feel she was getting the care that she needed, despite the fact that I tried to assure her that this was not the case   Review of Systems See HPI Denies recent fever or chills. Denies sinus pressure, nasal congestion, ear pain or sore throat. Denies chest congestion, productive cough or wheezing. Denies chest pains,PNd, orthopnea and leg swelling Denies abdominal pain, , vomiting,diarrhea or constipation.   Denies dysuria, frequency, hesitancy or incontinence. C/o chronic  joint pain, swelling and limitation in mobility. Denies headaches, seizures, numbness, or tingling. Denies depression, anxiety or insomnia. Denies skin break down or rash.        Objective:   Physical Exam BP 130/70 mmHg  Pulse 60  Resp 18  Ht 5\' 1"  (1.549 m)  Wt 157 lb (71.215 kg)  BMI 29.68 kg/m2  SpO2 92%  LMP  (Exact Date) Patient alert and oriented and in no cardiopulmonary distress.  HEENT: No facial asymmetry, EOMI,   oropharynx pink and moist.  Neck supple no JVD, no mass.  Chest: Clear to auscultation bilaterally.  CVS: S1, S2 no murmurs, no S3.Regular rate.  ABD: Soft non tender. Normal BS  Ext: No edema  MS: Adequate though reduced  ROM spine, shoulders, hips and knees.  Skin: Intact, no ulcerations or rash noted.  Psych: Good eye contact, normal affect. Memory intact not anxious or depressed appearing.  CNS: CN 2-12 intact, power,  normal throughout.no focal deficits noted.        Assessment & Plan:  History of palpitations Office EKG done at visit  on 07/15/2015 showed NSR, no ischemia  Essential hypertension Blood pressure checked personally by me and normal DASH diet and commitment to daily physical activity for a minimum of 30 minutes discussed and encouraged, as a part of hypertension management. The importance of attaining a healthy weight is also discussed.  BP/Weight 08/04/2015 08/03/2015 07/29/2015 07/27/2015 07/15/2015 07/07/2015 [id number]  Systolic BP [id number]  Diastolic BP 60 81 78 74 70 60 60  Wt. (Lbs) 158 155.8 158.6 158.2 157 157 -  BMI 29.87 29.45 32.02 31.94 29.68 29.68 -        Poorly controlled type 2 diabetes mellitus with circulatory disorder [facility]) Ms. Toral is reminded of the importance of commitment to daily physical activity for 30 minutes or more, as able and the need to limit carbohydrate intake to 30 to 60 grams per meal to help with blood sugar control.   The need to take medication as prescribed, test blood sugar as directed, and to call between visits if there is a concern that blood sugar is uncontrolled is also discussed.   Ms. Kirst is reminded of the importance of daily foot exam, annual eye examination, and good blood sugar, blood pressure and cholesterol control. Treated by endo and well controlled currently  Diabetic Labs Latest Ref Rng 07/19/2015 06/30/2015 06/23/2015 05/31/2015 05/01/2015  HbA1c 4.8 - 5.6 % - - - 6.2(H) -  Microalbumin <2.0 mg/dL - - - - -  Micro/Creat Ratio 0.0 - 30.0  mg/g - - - - -  Chol 125 - 200 mg/dL 136 - - - -  HDL >=46 mg/dL 44(L) - - - -  Calc LDL <130 mg/dL 66 - - - -  Triglycerides <150 mg/dL 131 - - - -  Creatinine 0.50 - 0.99 mg/dL 0.90 0.87 0.94 0.90 1.02(H)  GFR >60.00 mL/min - - - - -   BP/Weight 08/04/2015 08/03/2015 07/29/2015 07/27/2015 07/15/2015 07/07/2015 [id number]  Systolic BP [id number]  Diastolic BP 60 81 78 74 70 60 60  Wt. (Lbs) 158 155.8 158.6 158.2 157 157 -  BMI 29.87 29.45 32.02 31.94 29.68 29.68 -   Foot/eye exam  completion dates Latest Ref Rng 10/06/2014 10/03/2013  Eye Exam No Retinopathy No Retinopathy No Retinopathy  Foot exam Order - - -  Foot Form Completion - - -         Vertigo Currently symptomatic, o nystagmus, antivert prescribed for short term, as needed, use  Nausea without vomiting zofran administered in office as currently symptomatic

## 2015-07-15 NOTE — Telephone Encounter (Signed)
OV today 

## 2015-07-15 NOTE — Patient Instructions (Signed)
Wellness visit as before in May  EKG does not show rapid heart rate and your blood pressure is not elevated, it is normal  A few tablets meclizine are sent for dizziness, only use if room is spinning  Zofran given for nausea in the office

## 2015-07-15 NOTE — Telephone Encounter (Signed)
Patient is also requesting medication for nausea and injection

## 2015-07-19 DIAGNOSIS — E785 Hyperlipidemia, unspecified: Secondary | ICD-10-CM | POA: Diagnosis not present

## 2015-07-19 DIAGNOSIS — I10 Essential (primary) hypertension: Secondary | ICD-10-CM | POA: Diagnosis not present

## 2015-07-20 ENCOUNTER — Other Ambulatory Visit: Payer: Self-pay | Admitting: Gastroenterology

## 2015-07-20 DIAGNOSIS — L739 Follicular disorder, unspecified: Secondary | ICD-10-CM | POA: Diagnosis not present

## 2015-07-20 DIAGNOSIS — J440 Chronic obstructive pulmonary disease with acute lower respiratory infection: Secondary | ICD-10-CM | POA: Diagnosis not present

## 2015-07-20 DIAGNOSIS — Z79899 Other long term (current) drug therapy: Secondary | ICD-10-CM | POA: Diagnosis not present

## 2015-07-20 DIAGNOSIS — L299 Pruritus, unspecified: Secondary | ICD-10-CM | POA: Diagnosis not present

## 2015-07-20 LAB — COMPLETE METABOLIC PANEL WITH GFR
ALT: 12 U/L (ref 6–29)
AST: 13 U/L (ref 10–35)
Albumin: 3.6 g/dL (ref 3.6–5.1)
Alkaline Phosphatase: 146 U/L — ABNORMAL HIGH (ref 33–130)
BUN: 23 mg/dL (ref 7–25)
CO2: 28 mmol/L (ref 20–31)
Calcium: 8.8 mg/dL (ref 8.6–10.4)
Chloride: 105 mmol/L (ref 98–110)
Creat: 0.9 mg/dL (ref 0.50–0.99)
GFR, EST NON AFRICAN AMERICAN: 67 mL/min (ref >=60)
GFR, Est African American: 78 mL/min (ref >=60)
GLUCOSE: 298 mg/dL — AB (ref 65–99)
POTASSIUM: 4 mmol/L (ref 3.5–5.3)
SODIUM: 141 mmol/L (ref 135–146)
TOTAL PROTEIN: 6.1 g/dL (ref 6.1–8.1)
Total Bilirubin: 0.2 mg/dL (ref 0.2–1.2)

## 2015-07-20 LAB — LIPID PANEL
Cholesterol: 136 mg/dL (ref 125–200)
HDL: 44 mg/dL — AB (ref >=46)
LDL CALC: 66 mg/dL (ref <130)
Total CHOL/HDL Ratio: 3.1 Ratio (ref <=5.0)
Triglycerides: 131 mg/dL (ref <150)
VLDL: 26 mg/dL (ref <30)

## 2015-07-21 ENCOUNTER — Other Ambulatory Visit: Payer: Self-pay | Admitting: Pharmacist

## 2015-07-21 NOTE — Patient Outreach (Signed)
[facility]) Care Management  [place]   07/21/2015  [name] 01-Mar-1951 [id number]  Subjective: [name] is a 65 yo who was referred to [facility] for medication reconciliation.    Phone call to patient who verified name and date of birth.  Explained purpose of phone call to review medications with patient.  Patient reports she is not interested in reviewing her medications.  Patient reports she has recently had her medications reviewed by [facility] and [name].  Patient also confirms that [facility] fills her pill boxes for her.      Objective:   Encounter Medications: Outpatient Encounter Prescriptions as of 07/21/2015  Medication Sig Note  . ACCU-CHEK FASTCLIX LANCETS MISC Use to test blood sugar 4 times daily. Dx: E10.65   . albuterol (PROVENTIL HFA;VENTOLIN HFA) 108 (90 BASE) MCG/ACT inhaler Inhale 1 puff into the lungs 3 (three) times daily.   . Alcohol Swabs (B-D SINGLE USE SWABS REGULAR) PADS Use for injections and glucose testing 8 times daily. Dx: E10.65   . amLODipine (NORVASC) 5 MG tablet TAKE 1 TABLET BY MOUTH ONCE DAILY.   [name] aspirin EC 81 MG tablet Take 81 mg by mouth daily.   . BD PEN NEEDLE NANO U/F 32G X 4 MM MISC USE FOUR TIMES DAILY   . Blood Glucose Calibration (ACCU-CHEK SMARTVIEW CONTROL) LIQD 1 each by Other route as needed.   . Blood Glucose Monitoring Suppl (ACCU-CHEK NANO SMARTVIEW) W/DEVICE KIT 1 each by Does not apply route daily. Dx: E10.65   . Cholecalciferol (D 5000) 5000 UNITS capsule Take 5,000 Units by mouth daily.   . clobetasol cream (TEMOVATE) 7.20 % Apply 1 application topically daily.   . cloNIDine (CATAPRES) 0.3 MG tablet TAKE 1 TABLET BY MOUTH EVERY EIGHT HOURS. ONCE AT 8AM, 4PM, AND 12 MIDNIGHT.   [name] conjugated estrogens (PREMARIN) vaginal cream Place 1 Applicatorful vaginally 2 (two) times a week.   . diclofenac sodium (VOLTAREN) 1 % GEL Apply 2 g topically daily as needed (Pain).   [name] dicyclomine  (BENTYL) 10 MG capsule TAKE 1 CAPSULE BY MOUTH THREE TIMES DAILY BEFORE MEALS.   . glucose blood (ACCU-CHEK SMARTVIEW) test strip Use to test blood sugar 4 times daily as instructed. Dx: E10.65   . HYDROcodone-acetaminophen (NORCO) 5-325 MG tablet Take 1 tablet by mouth every 6 (six) hours as needed for moderate pain.   . hydroxychloroquine (PLAQUENIL) 200 MG tablet Take 200 mg by mouth daily.   . insulin aspart (NOVOLOG FLEXPEN) 100 UNIT/ML FlexPen Inject 14-18 Units into the skin 3 (three) times daily with meals. 07/05/2015: Recently decreased to 10u small meals and 14u larger meals  . Insulin Glargine (TOUJEO SOLOSTAR) 300 UNIT/ML SOPN Inject 35 Units into the skin at bedtime. 07/05/2015: Decreased to 25 u   . lamoTRIgine (LAMICTAL) 100 MG tablet TAKE 1 TABLET BY MOUTH TWICE DAILY.   [name] levothyroxine (SYNTHROID, LEVOTHROID) 50 MCG tablet TAKE 1 TABLET BY MOUTH ONCE DAILY AND 1/2 TABLET ON SUNDAYS.   [name] LORazepam (ATIVAN) 0.5 MG tablet Take 1 tablet (0.5 mg total) by mouth 3 (three) times daily.   . meclizine (ANTIVERT) 12.5 MG tablet Take 1 tablet (12.5 mg total) by mouth 3 (three) times daily as needed for dizziness.   . methocarbamol (ROBAXIN) 500 MG tablet Take 500 mg by mouth daily.   . metoprolol (LOPRESSOR) 50 MG tablet TAKE 1 TABLET BY MOUTH TWICE DAILY.   . montelukast (SINGULAIR) 10 MG tablet TAKE ONE  TABLET BY MOUTH ONCE DAILY.   . Multiple Vitamins-Minerals (ONE-A-DAY 50 PLUS PO) Take 1 tablet by mouth daily.   [name] Nystatin (NYAMYC) 100000 UNIT/GM POWD APPLY TO AFFECTED AREA 4 TIMES DAILY.   [name] polyethylene glycol powder (MIRALAX) powder Take 17 g by mouth daily. To prevent constipation   . pregabalin (LYRICA) 75 MG capsule Take 75 mg by mouth 2 (two) times daily.    . RABEprazole (ACIPHEX) 20 MG tablet TAKE 1 TABLET BY MOUTH TWICE DAILY.   [name] RESTASIS 0.05 % ophthalmic emulsion Place 1 drop into both eyes 2 (two) times daily.    . risperiDONE (RISPERDAL) 0.5 MG tablet Take 1 tablet (0.5 mg  total) by mouth at bedtime.   . rosuvastatin (CRESTOR) 5 MG tablet TAKE 1 TABLET BY MOUTH AT BEDTIME.   [name] sertraline (ZOLOFT) 100 MG tablet Take 2 tablets (200 mg total) by mouth daily.   . traZODone (DESYREL) 150 MG tablet Take 2 tablets (300 mg total) by mouth at bedtime.   . vitamin E 400 UNIT capsule Take 400 Units by mouth daily.   . [DISCONTINUED] oxyCODONE-acetaminophen (PERCOCET/ROXICET) 5-325 MG tablet Take 1-2 tablets by mouth every 4 (four) hours as needed for severe pain (moderate to severe pain (when tolerating fluids)). (Patient not taking: Reported on 07/21/2015)   . [DISCONTINUED] polyethylene glycol powder (MIRALAX) powder Take 17 g by mouth daily. To prevent constipation (Patient not taking: Reported on 07/21/2015)   . [DISCONTINUED] traMADol (ULTRAM) 50 MG tablet Take 1 tablet (50 mg total) by mouth every 6 (six) hours as needed for moderate pain or severe pain. (Patient not taking: Reported on 07/21/2015)    No facility-administered encounter medications on file as of 07/21/2015.   Functional Status: In your present state of health, do you have any difficulty performing the following activities: 06/29/2015 06/23/2015  Hearing? N N  Vision? N -  Difficulty concentrating or making decisions? N N  Walking or climbing stairs? Y Y  Dressing or bathing? N N  Doing errands, shopping? Y Y  Preparing Food and eating ? - -  Using the Toilet? - -  In the past six months, have you accidently leaked urine? - -  Do you have problems with loss of bowel control? - -  Managing your Medications? - -  Managing your Finances? - -  Housekeeping or managing your Housekeeping? - -   Fall/Depression Screening: PHQ 2/9 Scores 04/21/2015 03/19/2015 02/10/2015  PHQ - 2 Score 1 2 0  PHQ- 9 Score - 9 -    Assessment:  Drugs sorted by system:  Neurologic/Psychologic: lamotrigine, lorazepam, risperidone, sertraline, trazodone  Cardiovascular: amlodipine, aspirin, clonidine, metoprolol tartrate,  rosuvastatin  Pulmonary/Allergy: albuterol HFA, montelukast  Gastrointestinal: dicyclomine, polyethylene glycol, rabeprazole  Endocrine: Novolog, Toujeo, levothyroxine  Renal: none   Topical: clobetasol cream, nystatin powder  Pain: methocarbamol, pregabalin, tramadol  Vitamins/Minerals: cholecalciferol, Restasis, Voltaren gel, Premarin cream, multivitamin, vitamin E  Infectious Diseases: none  Miscellaneous: hydroxychloroquine, meclizine   Duplications in therapy: patient had hydrocodone-acetaminophen, oxycodone-acetaminophen, and tramadol on her medication list - I was able to confirm during phone call that patient is no longer taking oxycodone-acetaminophen and tramadol; and she only takes hydrocodone-acetaminophen as needed.   Gaps in therapy: Based on the AHA estimate 10-year risk for ASCVD calculator, patient has a calculated 10-year risk of 30.4%.  Patient is currently on moderate-intensity statin therapy.  Based on this risk estimate, recommend a high intensity statin for this patient.    Medications to avoid  in the elderly:  - rabeprazole (risk of Clostridium difficile infection and bone loss and fractures) - lorazepam (increase risk of cognitive impairment, delirium, falls, fractures, and motor vehicle crashes) - methocarbamol (anticholingergic adverse effects, sedation, increased risk of fractures)  - meclizine (highly anticholinergic - risk of confusion, dry mouth, constipation) - dicyclomine (highly anticholinergic)  Drug interactions:  Hydrocodone and lorazepam - may cause additive CNS depression   Other issues noted: none noted   Plan: 1.  Based on patient's estimate 10-year risk for ASCVD of 30.4%, will send a letter to patient's PCP to recommend use of a high intensity statin, such as rosuvastatin 20 or 40 mg daily.   2.  Patient is on multiple medications on the beers list of medications that should be avoided in older adults.  I will send a letter to  patient's PCP to recommend using caution with medications on the beers list, and will recommend for rabeprazole to be switched to a H2 blocker for reflux if clinically appropriate.   3.  Patient is on hydrocodone and lorazepam which may cause additive CNS depression.  I counseled patient about the risk of slowed or difficult breathing and/or sedation with the combined use of these medications.  Will send a letter to patient's PCP to recommend prescribing a naloxone kit for this patient if clinically appropriate.   4.  Will close pharmacy program as patient declines pharmacy services.  I have updated [facility] CMRN [facility].     Elisabeth Most, Pharm.D. Pharmacy Resident [place] [phone]

## 2015-07-22 ENCOUNTER — Telehealth: Payer: Self-pay | Admitting: Internal Medicine

## 2015-07-22 ENCOUNTER — Ambulatory Visit (INDEPENDENT_AMBULATORY_CARE_PROVIDER_SITE_OTHER): Payer: Commercial Managed Care - HMO | Admitting: Otolaryngology

## 2015-07-22 DIAGNOSIS — R040 Epistaxis: Secondary | ICD-10-CM | POA: Diagnosis not present

## 2015-07-22 NOTE — Telephone Encounter (Signed)
PT requests call back from [place], did not specify why.

## 2015-07-22 NOTE — Telephone Encounter (Signed)
Called pt and lvm advising her that I will try to reach her tomorrow.

## 2015-07-23 ENCOUNTER — Other Ambulatory Visit: Payer: Self-pay

## 2015-07-23 ENCOUNTER — Encounter: Payer: Self-pay | Admitting: Pharmacist

## 2015-07-23 DIAGNOSIS — Z1231 Encounter for screening mammogram for malignant neoplasm of breast: Secondary | ICD-10-CM

## 2015-07-23 NOTE — Telephone Encounter (Signed)
Called pt again and lvm.

## 2015-07-26 ENCOUNTER — Encounter: Payer: Self-pay | Admitting: Family Medicine

## 2015-07-26 NOTE — Telephone Encounter (Signed)
Aware. 

## 2015-07-27 ENCOUNTER — Encounter: Payer: Self-pay | Admitting: Internal Medicine

## 2015-07-27 ENCOUNTER — Ambulatory Visit (INDEPENDENT_AMBULATORY_CARE_PROVIDER_SITE_OTHER): Payer: Commercial Managed Care - HMO | Admitting: Internal Medicine

## 2015-07-27 ENCOUNTER — Telehealth: Payer: Self-pay | Admitting: Internal Medicine

## 2015-07-27 VITALS — BP 126/74 | HR 60 | Ht 59.0 in | Wt 158.2 lb

## 2015-07-27 DIAGNOSIS — J449 Chronic obstructive pulmonary disease, unspecified: Secondary | ICD-10-CM | POA: Diagnosis not present

## 2015-07-27 DIAGNOSIS — G4733 Obstructive sleep apnea (adult) (pediatric): Secondary | ICD-10-CM | POA: Diagnosis not present

## 2015-07-27 DIAGNOSIS — R0600 Dyspnea, unspecified: Secondary | ICD-10-CM

## 2015-07-27 DIAGNOSIS — J309 Allergic rhinitis, unspecified: Secondary | ICD-10-CM

## 2015-07-27 MED ORDER — AZELASTINE-FLUTICASONE 137-50 MCG/ACT NA SUSP: 1.0000 | Freq: Every day | NASAL | Status: DC

## 2015-07-27 NOTE — Patient Instructions (Signed)
Order- DME [facility]- reduce CPAP to 7       Dx OSA  Mrs Posten, please try using your CPAP every night. If you have nose bleeds again, you need to go back to your ENT doctor.  Sample Dymista nasal spray  1- puffs each nostril once daily at bedtime  Order- Office spirometry     Dx dyspnea

## 2015-07-27 NOTE — Telephone Encounter (Signed)
Spoke with pt and she states that transportation does not have her on their schedule to bring her to her appt today. She states that if we can fax a note to them showing that she has an appt they can try to get her here. Pt states that she did call to schedule for a ride but they do not have her on for today.  Spoke with transportation and they state that the pt did not schedule for a ride to her appt. They will need a fax to confirm that she does have an appt and that she needs to be seen today in order to be able to schedule her a ride for today. Fax sent showing pt's appt time, when it was made and stating that it will be >1 month before pt can be rescheduled.  [phone] phone  [phone] fax Transportation  Pt notified. KW notified.

## 2015-07-27 NOTE — Progress Notes (Signed)
06/18/12- 65 yoF referred courtesy of Dr Joycelyn Schmid [name]-sleep study last year per patient. She reports that she was witnessed stopping breathing in sleep, has a history of loud snoring and is a restless sleeper with frequent waking at night, bathroom trips and nightmares. Easy dyspnea on exertion during the day. She does smoke. New cough and sinus pressure this week attributed to "allergy" with green nasal discharge and no headache. She was sent to hospital April 1 when she says she couldn't wake up while at her doctor's office. She sleeps propped up. Gets sleepy in 30 minutes watching TV during the day. Bedtime 10 PM, sleep latency one or 2 hours, waking during the night 3 or 4 times before up at 8 AM. She had a sleep study at [facility] and was given CPAP but says she can't use it because the machine started smoking and she didn't like the pressure. She was changed from [place] to [place]. She is living alone, divorced and disabled. Son has OSA. History of meningioma 11/23/2011. No seizure in over 3 years. Does not drive. NPSG 3/8/13[name]- mild obstructive sleep apnea, AHI 14 per hour, mostly in REM. Epworth sleepiness score 14/24. Weight 164 pounds.  09/26/12- 65 yoF  Former smoker followed for obstructive sleep apnea follows for:  review download from [facility] with pt today.  pt stated that her back has been hurting her.   Did stop smoking! Some wheeze with exertion. She complains that she is falling asleep before she puts her CPAP/ auto/ [employer] on. Download demonstrated poor compliance and control. She had complained of a long history of difficulty falling asleep. Says a neurologist had questioned narcolepsy. She denies cataplexy or sleep paralysis.  11/19/12- 65 yoF  Former smoker followed for obstructive sleep apnea, ? Lung nodule, complicated by hx resection of meningioma FOLLOWS FOR:  Wearing CPAP 8/ [employer] 6-8 hours per night.  Discuss lung nodule was told she had by Dr.  Tula Nakayama Sleep is comfortable with CPAP. Nuvigil sample did not help with daytime alertness. There was questionable lung nodule but not seen on recent CT chest. Incidental slip in bathroom last week- black eye. CT 08/19/12 IMPRESSION:  The opacity in the inferior segment of the lingula of the left  upper lobe is without significant change. It is morphology is  consistent with scarring and / or atelectasis. This does not have  the configuration of a nodule. Other minor areas of reticular  subsegmental atelectasis are stable as well. There is no discrete  lung nodule. The lungs are otherwise clear.  No additional short-term follow-up is felt indicated. There are no  findings suspicious for neoplastic disease.  Original Report Authenticated By: Lajean Manes, M.D.  05/21/13- 65 yoF  Former smoker followed for obstructive sleep apnea, R/O'd Lung nodule, complicated by hx resection of meningioma FOLLOWS FOR: has been using old CPAP machine(has been waiting for [facility]  to get her outlets changed over for her new CPAP machine). They have done this and still using old CPAP. Pt states Dr Moshe Cipro wants her to have another sleep study. NPSG 05/29/11- AHI 14/ hr CPAP 8/ [employer] Always sleepy with bedtime between 8 and 9 PM, at 7:30 AM. At least twice during the night. "addicted to Ativan"-withdrawal if she skips it. CT chest 12/16/12 IMPRESSION:  Minimal chronic atelectasis versus scarring at the bases of left  lower lobe and lingula.  Otherwise negative exam, with no evidence of pulmonary nodule.  No further workup required.  Electronically Signed  By: Lavonia Dana M.D.  On: 12/16/2012 12:09  08/12/13- 65 yoF Former smoker followed for obstructive sleep apnea, R/O'd Lung nodule, complicated by hx resection of meningioma. Bipolar, GERD, DM Follows for:  Pt here to discuss results of her slpit night sleep study. She is still using her old CPAP machine and is waking up several times per night.    09/23/13- 65 yoF Former smoker followed for obstructive sleep apnea, R/O'd Lung nodule, complicated by hx resection of meningioma. Bipolar, GERD, DM FOLLOWS FOR: Pt states that she is trying to use CPAP 8/ [employer] as much as possible. Only ranging between 2-5hrs at night. Pt states that mask does not fit well. Pt reports that Nuvigil did not work for her. Pt c/o some chest pressure, wheezing, SOB and fatigue.   10/30/13- 65 yoF Former smoker followed for obstructive sleep apnea with hypersomnia, R/O'd Lung nodule, complicated by hx resection of meningioma. Bipolar, GERD, DM Referred by Dr Moshe Cipro for new problem of wheezing dyspnea Remote history of pneumonia with no history of asthma or other known lung disease. She had smoked for 15 or 20 years, stopping in 2014. The patient complains of shortness of breath with exertion and wheezing noted especially in the last 4 weeks with no significant triggering event. She does not recognize that she had an infection. She will wheeze some sitting quietly but has not noticed wheezing lying down, when she is wearing her CPAP. CPAP 8/ [facility]- All night every night Gets "strangled" occasionally trying to swallow a pill or food. Has some history of reflux Office Spirometry 10/30/2013-submaximal effort based on appearance of loop and curve. Numbers would fit with severe restriction but her physiologic capability may be better than this. FVC 0.91/44%, FEV1 0.74/45%, FEV1/FVC 0.81, FEF 25-75% 1.43/69%. CXR 09/15/13 IMPRESSION:  The study is limited due to hypoinflation. Minimal infrahilar  atelectasis on the right is suspected.  Electronically Signed  By: David Martinique  On: 09/15/2013 12:45  12/26/13- 65 yoF Former smoker followed for obstructive sleep apnea with hypersomnia, , R/O'd Lung nodule, complicated by hx resection of meningioma. Bipolar, GERD, DM FOLLOWS FOR:  breathing is worse without the inhaler.  increase SOB and wheezing with exertion.  She feels she  is doing better. Less sleepy .Dulera inhaler does help, needs refill. Has had flu and pneumonia vaccines. CXR 10/30/13 IMPRESSION:  Chronic RIGHT basilar atelectasis.  Electronically Signed  By: Lavonia Dana M.D.  On: 10/30/2013 16:57  05/05/14-  36 yoF Former smoker followed for obstructive sleep apnea with hypersomnia, , R/O'd Lung nodule, complicated by hx resection of meningioma. Bipolar, GERD, DM FOLLOWS FOR: Pt still gets sob, wheeze with exertion. She has a dry cough. Usually has some wheeze and cough most days. Wants further much to stop smoking and is interested in trying Chantix. We discussed possibility the drug would impact her mood/psychiatric problems. Nicotine patches did not work. Complains of sharp pain to right of sternum, persistent, localized but does go away at times. Continues CPAP 8/[facility] without problem, all night every night.  09/07/14-  49 yoF Former smoker followed for obstructive sleep apnea with hypersomnia, , R/O'd Lung nodule, complicated by hx resection of meningioma. Bipolar, GERD, DM FOLLOWS FOR: Breathing has been holding pretty good; Wears CPAP 8 most every night for about 4-5 hours. DME is [facility].  Falls asleep in a chair reading before she puts CPAP on some nights Lives alone. Sleeps head of bed up for GERD. CXR 05/05/14 IMPRESSION: Stable linear scarring  at the right lung base. No definite active process. Electronically Signed  By: Ivar Drape M.D.  On: 05/05/2014 16:20  07/27/2015-65 year old female former smoker followed for OSA/hypersomnia, complicated by history resection of meningioma, Bipolar, GERD, DM CPAP 8/[facility]> 7 LOV 05/03/15 by NP- acute bronchitis treated with prednisone taper, Z-Pak. Smoking cessation emphasized. She admitted she was not using CPAP and complained. Retired. FOLLOWS FORL: DME [facility]. DL attached. Pt states she has not been wearing CPAP due to sinus infection and every time she wears CPAP she gets worse/causes nose bleeds. Went  to ENT and was told she had a broken blood vessel. Several psychotropic meds and meds with potential sedation such as trazodone, meclizine, lorazepam. She doesn't know if ENT visit involved cauterizing for epistaxis but bleeding has stopped. Uses nasal pillow mask with CPAP, but stopped for a while because of epistaxis. Has return appointment scheduled. PCP asked about PFT. She denies shortness of breath or cough currently but is not very active. Office Spirometry 07/27/2015-weak effort but still better than her last previous trial effort. Mild restriction of exhaled volume. FVC 1.35/67%, FEV1 1.27/79%, FEV1/FVC 0.94, FEF 25-75 percent 2.14/107%.  ROS-see HPI Constitutional:   No-   weight loss, night sweats, fevers, chills, +fatigue, lassitude. HEENT:  + headaches, +difficulty swallowing, tooth/dental problems, sore throat,       No-sneezing, itching, ear ache, nasal congestion, post nasal drip,  CV:  chest pain, orthopnea, PND, swelling in lower extremities, anasarca, dizziness, +palpitations Resp: +  shortness of breath with exertion or at rest.                productive cough,   non-productive cough,  No- coughing up of blood.              No-   change in color of mucus.   wheezing.   Skin: No-   rash or lesions. GI:  +heartburn, +indigestion, no-abdominal pain, nausea, vomiting,  GU:  MS:  + joint pain or swelling.   Neuro-     nothing unusual Psych:  No- change in mood or affect. +depression or +anxiety.  No memory loss.  OBJ- Physical Exam - + more alert than usual General-  Oriented, Affect-flat, Distress- none acute Skin- rash-none, lesions- none, excoriation- none Lymphadenopathy- none Head- Eyes- Gross vision intact, PERRLA, conjunctivae and secretions clear            Ears- Hearing, canals-normal            Nose- + mucus and granulation appearing tissue left nostril, no-Septal dev, polyps, erosion, perforation             Throat- Mallampati III , mucosa clear , drainage-  none, tonsils- atrophic, + dentures Neck- flexible , trachea midline, no stridor , thyroid nl, carotid no bruit Chest - symmetrical excursion , unlabored           Heart/CV- RRR , no murmur , no gallop  , no rub, nl s1 s2                           - JVD- none , edema- none, stasis changes- none, varices- none           Lung- clear to P&A, wheeze- none, cough+rattling , dullness-none, rub- none           Chest wall- + mild tenderness to firm pressure over right mid sternal costochondral joint Abd-  Br/ Gen/ Rectal- Not done, not indicated  Extrem- cyanosis- none, clubbing, none, atrophy- none, strength- nl. +cane Neuro- + seems fully oriented. Rocking in chair.

## 2015-07-27 NOTE — Assessment & Plan Note (Signed)
We discussed medical goals and compliance goals for use of CPAP. She stopped using it for a little while because of sinusitis and epistaxis. We can try reducing airflow reduced nasal irritation. Plan-reduce CPAP pressure to 7

## 2015-07-27 NOTE — Assessment & Plan Note (Signed)
Better effort on spirometry at this visit compared with 2015. I still suspect submaximal numbers due to effort.

## 2015-07-29 ENCOUNTER — Other Ambulatory Visit (HOSPITAL_COMMUNITY): Payer: Self-pay | Admitting: Psychiatry

## 2015-07-29 ENCOUNTER — Encounter: Payer: Self-pay | Admitting: Internal Medicine

## 2015-07-29 ENCOUNTER — Ambulatory Visit (INDEPENDENT_AMBULATORY_CARE_PROVIDER_SITE_OTHER): Payer: Commercial Managed Care - HMO | Admitting: Internal Medicine

## 2015-07-29 VITALS — BP 178/78 | HR 66 | Temp 98.3°F | Wt 158.6 lb

## 2015-07-29 DIAGNOSIS — E039 Hypothyroidism, unspecified: Secondary | ICD-10-CM | POA: Diagnosis not present

## 2015-07-29 DIAGNOSIS — E042 Nontoxic multinodular goiter: Secondary | ICD-10-CM

## 2015-07-29 DIAGNOSIS — E1165 Type 2 diabetes mellitus with hyperglycemia: Secondary | ICD-10-CM | POA: Diagnosis not present

## 2015-07-29 DIAGNOSIS — Z794 Long term (current) use of insulin: Secondary | ICD-10-CM

## 2015-07-29 DIAGNOSIS — I10 Essential (primary) hypertension: Secondary | ICD-10-CM | POA: Diagnosis not present

## 2015-07-29 MED ORDER — AMLODIPINE BESYLATE 5 MG PO TABS: 10.0000 mg | ORAL_TABLET | Freq: Every day | ORAL | Status: DC

## 2015-07-29 MED ORDER — INSULIN GLARGINE 300 UNIT/ML ~~LOC~~ SOPN: 25.0000 [IU] | PEN_INJECTOR | Freq: Every day | SUBCUTANEOUS | Status: DC

## 2015-07-29 NOTE — Patient Instructions (Addendum)
Patient Instructions  Please continue: - Toujeo 25 units at bedtime - Novolog:  10 units with a smaller meal  14 units with a larger meal or with lunch   Please continue the current dose of Levoxyl.  Take the thyroid hormone every day, with water, at least 30 minutes before breakfast, separated by at least 4 hours from: - acid reflux medications - calcium - iron - multivitamins  Please increase Amlodipine to 10 mg daily. Please return in 3 months with your sugar log.

## 2015-07-29 NOTE — Progress Notes (Signed)
Patient ID: [name], female   DOB: February 11, 1951, 65 y.o.   MRN: [id number]  HPI: [name] is a 65 y.o.-year-old female, initially referred by her PCP, Dr. Moshe [name], for management of DM2, dx 1995, insulin-dependent since 1997, uncontrolled, with complications (gastroparesis, cerebrovasc. Ds-  H/o stroke, PN),  Hypothyroidism, thyroid nodules.  Last visit 3 mo ago.  She had rectocele surgery in 05/2015.   DM2: Last hemoglobin A1c was: Lab Results  Component Value Date   HGBA1C 6.2* 05/31/2015   HGBA1C 6.4* 02/21/2015   HGBA1C 6.2 12/22/2014  10/28/2013: HbA1c 8.5%  She is now on (decreased in 04/23/2015) - Toujeo 35 >> 25 units at bedtime - Novolog:  10 units with a smaller meal  14 units with a larger meal or with lunch Could not tolerate Metformin >> nausea.   Pt checks her sugars 4 a day and they are - after last insulin dose adjustment:  - am:88-147, 184 >> 98, 100-222, 310 >> 81-128, 166 >> 36, 41, 52-100, 122 >> 115-139, 158 >> 100-190, 201 - 2h after b'fast: n/c  - before lunch: 84, 96-219, 309 >> 71-131, 170, 261 >> 36, 39, 65-117, 227 >> 129-154, 229 >> 44x1, 82-162, 201 - 2h after lunch: n/c >> 127-154, 229 - before dinner: 98-301, 398 >> 66, 81-150, 170 >> 47-133, 243 >> 125, 212, 289 (may forget ins) >> 81-179, 287 - 2h after dinner: n/c - bedtime:  101-193 >> 93, 160-218, 290, 398 >> 98-175 >> 54-150, 269 >> 64x1, 127-242 >> 57, 90-302 - nighttime: n/c No lows. Lowest sugar was 69 >> 88x1 >> 84 x1 >> 36 >> 64x1 >> 44x1; she has hypoglycemia awareness at 70.  Highest sugar was 200s >> 390s >> 289 >> 309.  Pt's meals are: - Breakfast: eggs, cereals, fruit, oatmeal, bacon - Lunch: sandwich, beef, mashed potatoes,diet soda - Dinner: chicken, beef, fish, potatoes, vegetables - Snacks: 1-2: fruit She exercises 1x a day - walks.  - + mild CKD, last BUN/creatinine:  Lab Results  Component Value Date   BUN 23 07/19/2015   CREATININE 0.90 07/19/2015  On  Losartan. - last set of lipids: Lab Results  Component Value Date   CHOL 136 07/19/2015   HDL 44* 07/19/2015   LDLCALC 66 07/19/2015   TRIG 131 07/19/2015   CHOLHDL 3.1 07/19/2015  On Ezetimibe + Crestor. - last eye exam was in 10/06/2014. No DR. Had cataract sx B. - + numbness and tingling in her leg L leg (affected by stroke) but also in R. Sees podiatry.  Hypothyroidism: - dx > 10 years ago. - On Levoxyl 50 mcg 6/7 days and 25 1/7 days.  - in am (at 7 am) - fasting - with water - >30 min b/f b'fast - no Ca - no Fe - + MVI at noon - + Aciphex moved at noon  Last TSH: Lab Results  Component Value Date   TSH 1.207 02/21/2015  Previously: Lab Results  Component Value Date   TSH 1.453 08/02/2011   TSH 0.666 09/14/2009   TSH 0.295  05/30/2009   TSH 1.126 03/18/2009   TSH 0.104  09/15/2008   TSH 0.066  09/14/2008   TSH 1.041  02/03/2008   TSH 0.992 09/25/2007   TSH 0.257 08/25/2007   TSH 1.181 05/23/2006   She has a h/o thyroid nodules. + dysphagia, + hoarseness.  Reviewed thyroid U/S (10/2013):  Right thyroid lobe: 3.7 x 1.0 x 1.8 cm   Left thyroid lobe:  3.5 x 1.5 x 1.6 cm   Isthmus: 0.5 cm   Focal nodules: There is a 0.3 mm calcified nodule in the right midzone. There is a 0.6 x 0.7 x 0.5 cm hypoechoic nodule in the  lateral aspect of the right midzone. There is a 0.7 x 0.6 x 0.5 cm hyperechoic nodule in the lateral aspect of the right lower pole.  There is a 2.2 x 1.1 x 1.7 cm complex solid nodule in the inferior aspect of the isthmus extending adjacent to the left lower pole.  There is a 1.4 x 2.1 x 1.4 cm complex solid nodule in the left lower pole.   Lymphadenopathy: None visualized.  Repeat U/S (01/2014): stable appearance of nodules  FNA (2014) x2: benign  She had a L rib fracture in 12/2014.  ROS: Constitutional: no weight loss, no fatigue, + subjective hypothermia, + nocturia Eyes: + blurry vision, no xerophthalmia ENT: no sore throat, no nodules palpated  in throat, no dysphagia/no odynophagia, no hoarseness Cardiovascular: no CP/+ SOB/no palpitations/leg swelling Respiratory: no cough/+ SOB Gastrointestinal: no N/V/D/C Musculoskeletal: + muscle/no joint aches Skin: no rashes Neurological: no tremors/numbness/tingling/dizziness  I reviewed pt's medications, allergies, PMH, social hx, family hx, and changes were documented in the history of present illness. Otherwise, unchanged from my initial visit note - except stopped Remeron, started Risperdal: Past Medical History  Diagnosis Date  . Pancreatitis 2006    due to Depakote with normal EUS   . Osteoporosis   . Chronic back pain   . Migraines     chronic headaches  . Diverticulosis     TCS 9/08 by Dr. Lina [name] for diarrhea . Bx for micro scopic colitis negative.   . Schatzki's ring     non critical / EGD with ED 8/2011with RMR  . Glaucoma     eye drops daily  . Anemia   . Blood transfusion   . Stroke [facility])     left sided weakness  . Metabolic encephalopathy 08/03/2011  . Sleep apnea     on CPAP  . Arthritis   . Gum symptoms     infection on antibiotic  . Chronic neck pain   . Mononeuritis lower limb   . Frequent falls   . Diabetes mellitus     Type II  . Cervical disc disorder with radiculopathy of cervical region 10/31/2012  . COPD (chronic obstructive pulmonary disease) with chronic bronchitis (HCC) 09/16/2013    Office Spirometry 10/30/2013-submaximal effort based on appearance of loop and curve. Numbers would fit with severe restriction but her physiologic capability may be better than this. FVC 0.91/44%, and 10.74/45%, FEV1/FVC 0.81, FEF 25-75% 1.43/69%    . Carpal tunnel syndrome of right wrist 05/23/2011  . Hemiplegia affecting non-dominant side, post-stroke (HCC) 08/02/2011  . Hypertension     takes Amlodipine,Metoprolol,and Clonidine daily  . Hyperlipidemia     takes Crestor daily  . Insomnia     takes Trazodone nightly  . Depression     takes Zoloft daily  .  Bipolar disorder (HCC)     takes Risperdal nightly  . GERD (gastroesophageal reflux disease)     takes Aciphex daily  . Anxiety     takes Ativan daily  . Hypothyroidism     takes Synthroid daily  . Seizures (HCC)     takes Lamictal daily.Last seizure 3 yrs ago   Past Surgical History  Procedure Laterality Date  . Abdominal hysterectomy  1978  . Cholecystectomy  1984  .  Ovarian cyst removal    . Carpal tunnel release Left 07/22/04    Dr. Aline Brochure  . Breast reduction surgery  1994  . Cataract extraction Bilateral   . Biopsy thyroid  2009  . Surgical excision of 3 tumors from right thigh and right buttock  and left upper thigh  2010  . Back surgery  July 2012  . Spine surgery  09/29/2010    Dr. Rolena Infante  . Maloney dilation  12/29/2010    RMR;  . Esophagogastroduodenoscopy  12/29/2010    Rourk-Retained food in the esophagus and stomach, small hiatal hernia, status post Maloney dilation of the esophagus  . Craniotomy  11/23/2011    Procedure: CRANIOTOMY TUMOR EXCISION;  Surgeon: Hosie Spangle, MD;  Location: [place] [facility];  Service: Neurosurgery;  Laterality: N/A;  Craniotomy for tumor resection  . Brain surgery  11/2011    resection of meningioma  . Colonoscopy N/A 09/25/2012    RRN:HAFBXUX diverticulosis.  colonic polyp-removed : tubular adenoma  . Esophagogastroduodenoscopy N/A 09/25/2012    YBF:XOVANVBT atonic baggy esophagus status post Maloney dilation 57 F. Hiatal hernia  . Givens capsule study N/A 01/15/2013    NORMAL.   . Bacterial overgrowth test N/A 05/05/2013    Procedure: BACTERIAL OVERGROWTH TEST;  Surgeon: Daneil Dolin, MD;  Location: [facility];  Service: Endoscopy;  Laterality: N/A;  7:30  . Transthoracic echocardiogram  2010    EF 60-65%, mild conc LVH, grade 1 diastolic dysfunction; mildly calcified MV annulus with mildly thickened leaflets, mildly calcified MR annulus  . Nm myocar perf wall motion  2006    "relavtiely normal" persantine, mild anterior  thinning (breast attenuation artifact), no region of scar/ischemia  . Cardiac catheterization  05/10/2005    normal coronaries, normal LV systolic function and EF (Dr. Jackie Plum)  . Tooth extraction Bilateral 12/14/2014    Procedure: REMOVAL OF BILATERAL MANDIBULAR EXOSTOSES;  Surgeon: Diona Browner, DDS;  Location: [place];  Service: Oral Surgery;  Laterality: Bilateral;  . Lesion removal N/A 05/31/2015    Procedure: REMOVAL RIGHT AND LEFT LESIONS OF MANDIBLE;  Surgeon: Diona Browner, DDS;  Location: [place];  Service: Oral Surgery;  Laterality: N/A;  . Rectocele repair N/A 06/29/2015    Procedure: POSTERIOR REPAIR (RECTOCELE);  Surgeon: Jonnie Kind, MD;  Location: [facility];  Service: Gynecology;  Laterality: N/A;   History   Social History  . Marital Status: Divorced    Spouse Name: N/A    Number of Children: 1  . Years of Education: 12   Occupational History  . disabled     Social History Main Topics  . Smoking status: Current Every Day Smoker -- 0.25 packs/day for 7 years    Types: Cigarettes  . Smokeless tobacco: Never Used     Comment: "started back but off now for 3 months" (08/18/13)  . Alcohol Use: No     Comment:    . Drug Use: No   Current Outpatient Rx  Name  Route  Sig  Dispense  Refill  . ACCU-CHEK FASTCLIX LANCETS MISC      Use to test blood sugar 4 times daily. Dx: E10.65   408 each   1   . albuterol (PROVENTIL HFA;VENTOLIN HFA) 108 (90 BASE) MCG/ACT inhaler   Inhalation   Inhale 1 puff into the lungs 3 (three) times daily.   1 Inhaler   2   . Alcohol Swabs (B-D SINGLE USE SWABS REGULAR) PADS  Use for injections and glucose testing 8 times daily. Dx: E10.65   900 each   1   . amLODipine (NORVASC) 5 MG tablet      TAKE 1 TABLET BY MOUTH ONCE DAILY.   30 tablet   3   . aspirin EC 81 MG tablet   Oral   Take 81 mg by mouth daily.         . Azelastine-Fluticasone (DYMISTA) 137-50 MCG/ACT SUSP   Nasal   Place 1 puff into the nose at bedtime.   1  Bottle   0   . BD PEN NEEDLE NANO U/F 32G X 4 MM MISC      USE FOUR TIMES DAILY   360 each   3   . Blood Glucose Calibration (ACCU-CHEK SMARTVIEW CONTROL) LIQD   Other   1 each by Other route as needed.   1 each   2   . Blood Glucose Monitoring Suppl (ACCU-CHEK NANO SMARTVIEW) W/DEVICE KIT   Does not apply   1 each by Does not apply route daily. Dx: E10.65   1 kit   0   . Cholecalciferol (D 5000) 5000 UNITS capsule   Oral   Take 5,000 Units by mouth daily.         . clobetasol cream (TEMOVATE) 0.05 %   Topical   Apply 1 application topically daily.         . cloNIDine (CATAPRES) 0.3 MG tablet      TAKE 1 TABLET BY MOUTH EVERY EIGHT HOURS. ONCE AT 8AM, 4PM, AND 12 MIDNIGHT.   90 tablet   3   . conjugated estrogens (PREMARIN) vaginal cream   Vaginal   Place 1 Applicatorful vaginally 2 (two) times a week.   30 g   1   . diclofenac sodium (VOLTAREN) 1 % GEL   Topical   Apply 2 g topically daily as needed (Pain).   100 g   2   . dicyclomine (BENTYL) 10 MG capsule      TAKE 1 CAPSULE BY MOUTH THREE TIMES DAILY BEFORE MEALS.   90 capsule   2   . glucose blood (ACCU-CHEK SMARTVIEW) test strip      Use to test blood sugar 4 times daily as instructed. Dx: E10.65   375 each   1   . HYDROcodone-acetaminophen (NORCO) 5-325 MG tablet   Oral   Take 1 tablet by mouth every 6 (six) hours as needed for moderate pain.   30 tablet   0   . hydroxychloroquine (PLAQUENIL) 200 MG tablet   Oral   Take 200 mg by mouth daily.         . insulin aspart (NOVOLOG FLEXPEN) 100 UNIT/ML FlexPen   Subcutaneous   Inject 14-18 Units into the skin 3 (three) times daily with meals.   30 mL   1   . Insulin Glargine (TOUJEO SOLOSTAR) 300 UNIT/ML SOPN   Subcutaneous   Inject 35 Units into the skin at bedtime.   6 pen   1   . lamoTRIgine (LAMICTAL) 100 MG tablet      TAKE 1 TABLET BY MOUTH TWICE DAILY.   60 tablet   5   . levothyroxine (SYNTHROID, LEVOTHROID) 50 MCG  tablet      TAKE 1 TABLET BY MOUTH ONCE DAILY AND 1/2 TABLET ON SUNDAYS.   30 tablet   2   . LORazepam (ATIVAN) 0.5 MG tablet   Oral   Take 1 tablet (0.5  mg total) by mouth 3 (three) times daily.   90 tablet   1   . meclizine (ANTIVERT) 12.5 MG tablet   Oral   Take 1 tablet (12.5 mg total) by mouth 3 (three) times daily as needed for dizziness.   15 tablet   0   . methocarbamol (ROBAXIN) 500 MG tablet   Oral   Take 500 mg by mouth daily.         . metoprolol (LOPRESSOR) 50 MG tablet      TAKE 1 TABLET BY MOUTH TWICE DAILY.   60 tablet   0   . montelukast (SINGULAIR) 10 MG tablet      TAKE ONE TABLET BY MOUTH ONCE DAILY.   30 tablet   3   . Multiple Vitamins-Minerals (ONE-A-DAY 50 PLUS PO)   Oral   Take 1 tablet by mouth daily.         [name] Nystatin (NYAMYC) 100000 UNIT/GM POWD      APPLY TO AFFECTED AREA 4 TIMES DAILY.   30 g   2   . polyethylene glycol powder (MIRALAX) powder   Oral   Take 17 g by mouth daily. To prevent constipation   255 g   prn   . pregabalin (LYRICA) 75 MG capsule   Oral   Take 75 mg by mouth 2 (two) times daily.          . RABEprazole (ACIPHEX) 20 MG tablet      TAKE 1 TABLET BY MOUTH TWICE DAILY.   60 tablet   5   . RESTASIS 0.05 % ophthalmic emulsion   Both Eyes   Place 1 drop into both eyes 2 (two) times daily.          . risperiDONE (RISPERDAL) 0.5 MG tablet   Oral   Take 1 tablet (0.5 mg total) by mouth at bedtime.   30 tablet   3   . rosuvastatin (CRESTOR) 5 MG tablet      TAKE 1 TABLET BY MOUTH AT BEDTIME.   30 tablet   3   . sertraline (ZOLOFT) 100 MG tablet   Oral   Take 2 tablets (200 mg total) by mouth daily.   60 tablet   3   . traZODone (DESYREL) 150 MG tablet   Oral   Take 2 tablets (300 mg total) by mouth at bedtime.   60 tablet   3   . vitamin E 400 UNIT capsule   Oral   Take 400 Units by mouth daily.            Allergies  Allergen Reactions  . Cephalexin Hives  . Iron  Nausea And Vomiting  . Milk-Related Compounds Other (See Comments)    Doesn't agree with stomach.   . Penicillins Hives    Has patient had a PCN reaction causing immediate rash, facial/tongue/throat swelling, SOB or lightheadedness with hypotension: Yes Has patient had a PCN reaction causing severe rash involving mucus membranes or skin necrosis: No Has patient had a PCN reaction that required hospitalization No Has patient had a PCN reaction occurring within the last 10 years: No If all of the above answers are "NO", then may proceed with Cephalosporin use.   [name] Phenazopyridine Hcl Hives         Family History  Problem Relation Age of Onset  . Heart attack Mother     HTN  . Pneumonia Father   . Kidney failure Father   . Diabetes Father   .  Pancreatic cancer Sister   . Diabetes Brother   . Hypertension Brother   . Diabetes Brother   . Colon cancer Neg Hx   . Anesthesia problems Neg Hx   . Hypotension Neg Hx   . Malignant hyperthermia Neg Hx   . Pseudochol deficiency Neg Hx   . Stroke Maternal Grandmother   . Heart attack Maternal Grandfather   . Cancer Sister     breast   . Alcohol abuse Maternal Uncle   . Hypertension Brother   . Hypertension Son   . Sleep apnea Son    PE: BP 178/78 mmHg  Pulse 66  Temp(Src) 98.3 F (36.8 C)  Wt 158 lb 9.6 oz (71.94 kg)  LMP  (Exact Date) Body mass index is 32.02 kg/(m^2).  Wt Readings from Last 3 Encounters:  07/29/15 158 lb 9.6 oz (71.94 kg)  07/27/15 158 lb 3.2 oz (71.759 kg)  07/07/15 157 lb (71.215 kg)   Constitutional: overweight, in NAD but again appears somnolent Eyes: surgical pupils, EOMI, no exophthalmos ENT: moist mucous membranes, no thyromegaly, large thyroid nodule felt in isthmus, no cervical lymphadenopathy Cardiovascular: RRR, No MRG Respiratory: CTA B Gastrointestinal: abdomen soft, NT, ND, BS+ Musculoskeletal: no deformities, strength intact in all 4 Skin: moist, warm, no rashes Neurological: no tremor  with outstretched hands, DTR normal in all 4  ASSESSMENT: 1. DM2, insulin-dependent, uncontrolled, without complications - gastroparesis - cerebrovasc. Ds -  H/o stroke - PN  2. Hypothyroidism  3. MNG - Thyroid U/S (10/11/2012):  Right thyroid lobe: 3.7 x 1.0 x 1.8 cm  Left thyroid lobe: 3.5 x 1.5 x 1.6 cm  Isthmus: 0.5 cm  Focal nodules: There is a 0.3 mm calcified nodule in the right midzone. There is a 0.6 x 0.7 x 0.5 cm hypoechoic nodule in the lateral aspect of the right midzone. There is a 0.7 x 0.6 x 0.5 cm hyperechoic nodule in the lateral aspect of the right lower pole. There is a 2.2 x 1.1 x 1.7 cm complex solid nodule in the inferior aspect of the isthmus extending adjacent to the left lower pole. There is a 1.4 x 2.1 x 1.4 cm complex solid nodule in the left lower pole.  Lymphadenopathy: None visualized.  IMPRESSION: Multi nodular goiter which has markedly progressed since the prior exam. The dominant nodule at the inferior aspect of the isthmus fits criteria for fine needle aspiration biopsy if not previously Assessed.  - FNA (11/05/2012) x2: benign  - Thyroid U/S (01/20/2014) - felt one nodule enlarging >> new U/S: stable appearance of the nodules, except a small new nodule in isthmus: 1.2 cm   Right thyroid lobe Measurements: 4.4 cm x 1.2 cm x 1.7 cm. Multiple right-sided nodules identified. Each right-sided nodule demonstrates increased echogenicity, with the superior measuring 7 mm -8 mm, most inferior measuring 9 mm - 10 mm. A small, 3 mm focus of calcium with posterior shadowing is evident. There is also a mid nodule measuring 7 mm, which is echogenic.  Left thyroid lobe Measurements: 5.1 cm x 2.1 cm x 2.3 cm. Dominant lesion at the inferior pole of left thyroid is again evident, which has been previously biopsied (10/29/2012). This nodule measures 1.8 cm x 1.7cm x 2.5 cm. (Previous 1.4 cm x 2.1 cm x 1.4 cm)  Isthmus Thickness: 4 mm-5 mm.  Isthmic nodule again noted, previously biopsied (10/29/2012). Currently this nodule measures 2.2 cm x 1.4 cm x 1.8 cm. (previous measurement 2.2 cm x 1.1 cm x 1.7  cm). There is a new nodule identified within the isthmus with heterogeneously hyperechoic characteristics. This nodule measures 12 mm x 5.4 mm x 7.2 mm.  Lymphadenopathy: None visualized.   PLAN:  1. Patient with long-standing, uncontrolled diabetes, on basal-bolus regimen, with few lows >> we have decreased her doses  >> Less lows after last decrease. Had 1 x 44  - does not know what happened... Will not change regimen for now. - last HbA1c <7% - I advised her to:  Patient Instructions  Please continue: - Toujeo 25 units at bedtime - Novolog:  10 units with a smaller meal  14 units with a larger meal or with lunch  Please return in 3 months with your sugar log.   - continue checking sugars at different times of the day - check 3-4 times a day, rotating checks - advised for yearly eye exams >> she is UTD - she is interested in a pump >> given her a brochure for a VGo to see if covered by insurance - Return to clinic in 1.5 mo with sugar log   2. Hypothyroidism - latest TFTs normal 5 mo ago - we discussed how to take the thyroid hormone every day, with water, >30 minutes before breakfast, separated by >4 hours from acid reflux medications, calcium, iron, multivitamins. She is taking the LT4 correctly. - continue LT4 50 mcg daily and on Sunday, 25 mcg.  3. MNG - previously had 2 normal Bx's (2014). Reviewed the ultrasounds from 2014 in 2015. - will need a new U/S in 2 years from previous (01/2016). - she c/o dysphagia, but no external Es compression per Ba swallow from 08/2014  - she did have some Es dysmotility and is s/p multiple Es dilations  4. HTN - high now and also at home - increase Amlodipine to 10 mg daily at least until she sees PCP at the end of June  - time spent with the patient: 40 min, of which >50% was  spent in obtaining information about her symptoms, reviewing her previous labs, evaluations, and treatments, counseling her about her conditions (please see the discussed topics above), and developing a plan to further investigate it; she had a number of questions which I addressed.

## 2015-07-30 ENCOUNTER — Telehealth: Payer: Self-pay

## 2015-07-30 NOTE — Telephone Encounter (Signed)
Patient aware.

## 2015-07-30 NOTE — Telephone Encounter (Signed)
I reviewed endo note, last Doc who saw her has given new direction based on the reading obtained, I recommend as she was intstructed take med at new dose ,unless she feels ill with this , as instruceted at her endo visit, she should stay on that dose till her appt with me in June

## 2015-07-30 NOTE — Telephone Encounter (Signed)
States that her endo increased her amlodipine to 2 tabs daily and she wants to know what you think. Stats her last 2 appts her BP has been good and then yesterday the endo office got 178/78 and increased her med. Wants to know what you want her to do.

## 2015-08-02 ENCOUNTER — Other Ambulatory Visit: Payer: Self-pay | Admitting: Family Medicine

## 2015-08-02 ENCOUNTER — Other Ambulatory Visit (HOSPITAL_COMMUNITY): Payer: Self-pay | Admitting: Psychiatry

## 2015-08-02 ENCOUNTER — Telehealth: Payer: Self-pay | Admitting: Family Medicine

## 2015-08-02 DIAGNOSIS — J440 Chronic obstructive pulmonary disease with acute lower respiratory infection: Secondary | ICD-10-CM | POA: Diagnosis not present

## 2015-08-02 NOTE — Telephone Encounter (Signed)
Please advise 

## 2015-08-02 NOTE — Telephone Encounter (Signed)
[name] is calling stating that she no longer wants to see Dr. Debara Pickett anymore, she is asking for a new cardiologist she is stating that he does not do anything for her, please advise?

## 2015-08-03 ENCOUNTER — Ambulatory Visit (INDEPENDENT_AMBULATORY_CARE_PROVIDER_SITE_OTHER): Payer: Commercial Managed Care - HMO | Admitting: Cardiology

## 2015-08-03 ENCOUNTER — Encounter: Payer: Self-pay | Admitting: Cardiology

## 2015-08-03 VITALS — BP 174/81 | HR 67 | Ht 61.0 in | Wt 155.8 lb

## 2015-08-03 DIAGNOSIS — IMO0001 Reserved for inherently not codable concepts without codable children: Secondary | ICD-10-CM | POA: Insufficient documentation

## 2015-08-03 DIAGNOSIS — Z79899 Other long term (current) drug therapy: Secondary | ICD-10-CM

## 2015-08-03 DIAGNOSIS — G4733 Obstructive sleep apnea (adult) (pediatric): Secondary | ICD-10-CM

## 2015-08-03 DIAGNOSIS — R0989 Other specified symptoms and signs involving the circulatory and respiratory systems: Secondary | ICD-10-CM | POA: Insufficient documentation

## 2015-08-03 DIAGNOSIS — E1165 Type 2 diabetes mellitus with hyperglycemia: Secondary | ICD-10-CM | POA: Diagnosis not present

## 2015-08-03 DIAGNOSIS — F3131 Bipolar disorder, current episode depressed, mild: Secondary | ICD-10-CM

## 2015-08-03 DIAGNOSIS — E785 Hyperlipidemia, unspecified: Secondary | ICD-10-CM

## 2015-08-03 DIAGNOSIS — Z794 Long term (current) use of insulin: Secondary | ICD-10-CM

## 2015-08-03 DIAGNOSIS — I10 Essential (primary) hypertension: Secondary | ICD-10-CM

## 2015-08-03 DIAGNOSIS — Z0389 Encounter for observation for other suspected diseases and conditions ruled out: Secondary | ICD-10-CM

## 2015-08-03 MED ORDER — LOSARTAN POTASSIUM 25 MG PO TABS: 25.0000 mg | ORAL_TABLET | Freq: Every day | ORAL | Status: DC

## 2015-08-03 NOTE — Assessment & Plan Note (Signed)
Recently seen by Dr Cruzita Lederer- Amlodipine increased

## 2015-08-03 NOTE — Telephone Encounter (Signed)
pls explain to pt that changing Docs constantly jeopardizes quality of health care. Needs to giove Doc time to get her problems taken care of. I am unwilling e to refer tio another new cardiologist at this time as she has had only one appt with Dr Debara Pickett and was just referred to him, keep follow up with him and explain her concerns

## 2015-08-03 NOTE — Assessment & Plan Note (Signed)
Multiple medications for multiple medical and psychiatric issues- ? If compliance is an issue, she denies

## 2015-08-03 NOTE — Progress Notes (Signed)
08/03/2015 [name]   1950/09/10  [id number]  Primary Physician Tula Nakayama, MD Primary Cardiologist: Einar Grad)  HPI:  38 y/oAA female with multiple medical problems as well a bipolar disorder and depression. She has seen by Dr Debara Pickett in the past. The pt is from [facility] and request her cardiology care be transferred there for convenience. We follow her for HTN. She had an abnormal Cardiolite in 2006 followed by a cath in 2007 which revealed normal coronaries. An echo in Dec 2016 revealed normal LVF, mild LVH, moderate pulm HTN. She is followed by Dr Annamaria Boots for COPD and OSA, Dr Cruzita Lederer for DM and hypothyroidism, Dr Tomi Likens for neurology (she is s/p  Craniotomy for meningioma 2013), and Dr Moshe Cipro is her PCP.           Recently it has been noted that her B/P is elevated. Dr Cruzita Lederer increased her Amlodipine to 10 mg daily. She is her today for follow up for her B/P. She says she has been compliant with her medications. She has vague chest discomfort at time and is chronically SOB. She denies orthopnea or PND.   Current Outpatient Prescriptions  Medication Sig Dispense Refill  . ACCU-CHEK FASTCLIX LANCETS MISC Use to test blood sugar 4 times daily. Dx: E10.65 408 each 1  . albuterol (PROVENTIL HFA;VENTOLIN HFA) 108 (90 BASE) MCG/ACT inhaler Inhale 1 puff into the lungs 3 (three) times daily. 1 Inhaler 2  . Alcohol Swabs (B-D SINGLE USE SWABS REGULAR) PADS Use for injections and glucose testing 8 times daily. Dx: E10.65 900 each 1  . amLODipine (NORVASC) 5 MG tablet Take 2 tablets (10 mg total) by mouth daily. 60 tablet 3  . aspirin EC 81 MG tablet Take 81 mg by mouth daily.    . Azelastine-Fluticasone (DYMISTA) 137-50 MCG/ACT SUSP Place 1 puff into the nose at bedtime. 1 Bottle 0  . BD PEN NEEDLE NANO U/F 32G X 4 MM MISC USE FOUR TIMES DAILY 360 each 3  . Blood Glucose Calibration (ACCU-CHEK SMARTVIEW CONTROL) LIQD 1 each by Other route as needed. 1 each 2  . Blood Glucose  Monitoring Suppl (ACCU-CHEK NANO SMARTVIEW) W/DEVICE KIT 1 each by Does not apply route daily. Dx: E10.65 1 kit 0  . Cholecalciferol (D 5000) 5000 UNITS capsule Take 5,000 Units by mouth daily.    . clobetasol cream (TEMOVATE) 5.94 % Apply 1 application topically daily.    . cloNIDine (CATAPRES) 0.3 MG tablet TAKE 1 TABLET BY MOUTH EVERY EIGHT HOURS. ONCE AT 8AM, 4PM, AND 12 MIDNIGHT. 90 tablet 3  . conjugated estrogens (PREMARIN) vaginal cream Place 1 Applicatorful vaginally 2 (two) times a week. 30 g 1  . diclofenac sodium (VOLTAREN) 1 % GEL Apply 2 g topically daily as needed (Pain). 100 g 2  . dicyclomine (BENTYL) 10 MG capsule TAKE 1 CAPSULE BY MOUTH THREE TIMES DAILY BEFORE MEALS. 90 capsule 2  . glucose blood (ACCU-CHEK SMARTVIEW) test strip Use to test blood sugar 4 times daily as instructed. Dx: E10.65 375 each 1  . HYDROcodone-acetaminophen (NORCO) 5-325 MG tablet Take 1 tablet by mouth every 6 (six) hours as needed for moderate pain. 30 tablet 0  . hydroxychloroquine (PLAQUENIL) 200 MG tablet Take 200 mg by mouth daily.    . insulin aspart (NOVOLOG FLEXPEN) 100 UNIT/ML FlexPen Inject 14-18 Units into the skin 3 (three) times daily with meals. 30 mL 1  . Insulin Glargine (TOUJEO SOLOSTAR) 300 UNIT/ML SOPN Inject 25 Units into the  skin at bedtime. 6 pen 1  . lamoTRIgine (LAMICTAL) 100 MG tablet TAKE 1 TABLET BY MOUTH TWICE DAILY. 60 tablet 5  . levothyroxine (SYNTHROID, LEVOTHROID) 50 MCG tablet TAKE 1 TABLET BY MOUTH ONCE DAILY AND 1/2 TABLET ON SUNDAYS. 30 tablet 2  . LORazepam (ATIVAN) 0.5 MG tablet Take 1 tablet (0.5 mg total) by mouth 3 (three) times daily. 90 tablet 1  . meclizine (ANTIVERT) 12.5 MG tablet Take 1 tablet (12.5 mg total) by mouth 3 (three) times daily as needed for dizziness. 15 tablet 0  . methocarbamol (ROBAXIN) 500 MG tablet Take 500 mg by mouth daily.    . metoprolol (LOPRESSOR) 50 MG tablet TAKE 1 TABLET BY MOUTH TWICE DAILY. 60 tablet 0  . montelukast (SINGULAIR)  10 MG tablet TAKE ONE TABLET BY MOUTH ONCE DAILY. 30 tablet 3  . Multiple Vitamins-Minerals (ONE-A-DAY 50 PLUS PO) Take 1 tablet by mouth daily.    [name] Nystatin (NYAMYC) 100000 UNIT/GM POWD APPLY TO AFFECTED AREA 4 TIMES DAILY. 30 g 2  . polyethylene glycol powder (MIRALAX) powder Take 17 g by mouth daily. To prevent constipation 255 g prn  . pregabalin (LYRICA) 75 MG capsule Take 75 mg by mouth 2 (two) times daily.     . RABEprazole (ACIPHEX) 20 MG tablet TAKE 1 TABLET BY MOUTH TWICE DAILY. 60 tablet 5  . RESTASIS 0.05 % ophthalmic emulsion Place 1 drop into both eyes 2 (two) times daily.     . risperiDONE (RISPERDAL) 0.5 MG tablet Take 1 tablet (0.5 mg total) by mouth at bedtime. 30 tablet 3  . rosuvastatin (CRESTOR) 5 MG tablet TAKE 1 TABLET BY MOUTH AT BEDTIME. 30 tablet 3  . sertraline (ZOLOFT) 100 MG tablet Take 2 tablets (200 mg total) by mouth daily. 60 tablet 3  . traZODone (DESYREL) 150 MG tablet Take 2 tablets (300 mg total) by mouth at bedtime. 60 tablet 3  . vitamin E 400 UNIT capsule Take 400 Units by mouth daily.    [name] losartan (COZAAR) 25 MG tablet Take 1 tablet (25 mg total) by mouth daily. 30 tablet 5   No current facility-administered medications for this visit.    Allergies  Allergen Reactions  . Cephalexin Hives  . Iron Nausea And Vomiting  . Milk-Related Compounds Other (See Comments)    Doesn't agree with stomach.   . Penicillins Hives    Has patient had a PCN reaction causing immediate rash, facial/tongue/throat swelling, SOB or lightheadedness with hypotension: Yes Has patient had a PCN reaction causing severe rash involving mucus membranes or skin necrosis: No Has patient had a PCN reaction that required hospitalization No Has patient had a PCN reaction occurring within the last 10 years: No If all of the above answers are "NO", then may proceed with Cephalosporin use.   [name] Phenazopyridine Hcl Hives          Social History   Social History  . Marital Status:  Divorced    Spouse Name: N/A  . Number of Children: 1  . Years of Education: 12   Occupational History  . disabled     Social History Main Topics  . Smoking status: Current Some Day Smoker -- 0.50 packs/day for 7 years    Types: Cigarettes  . Smokeless tobacco: Never Used     Comment: continues to smoke  . Alcohol Use: No     Comment:    . Drug Use: No  . Sexual Activity: No   Other Topics Concern  .  Not on file   Social History Narrative     Review of Systems: General: negative for chills, fever, night sweats or weight changes.  Cardiovascular: negative for edema, palpitations Dermatological: negative for rash Respiratory: negative for cough or wheezing Urologic: negative for hematuria Abdominal: negative for nausea, vomiting, diarrhea, bright red blood per rectum, melena, or hematemesis Neurologic: negative for visual changes, syncope, or dizziness All other systems reviewed and are otherwise negative except as noted above.    Blood pressure 174/81, pulse 67, height 5' 1"  (1.549 m), weight 155 lb 12.8 oz (70.67 kg).  General appearance: alert, cooperative, no distress and moderately obese Neck: no carotid bruit and no JVD Lungs: clear to auscultation bilaterally and kyphosis Heart: regular rate and rhythm Extremities: no edema Skin: Skin color, texture, turgor normal. No rashes or lesions Neurologic: Grossly normal  EKG NSR  ASSESSMENT AND PLAN:   Polypharmacy Multiple medications for multiple medical and psychiatric issues- ? If compliance is an issue, she denies  Type 2 diabetes mellitus with hyperglycemia, with long-term current use of insulin (HCC) Recently seen by Dr Cruzita Lederer- Amlodipine increased  Obstructive sleep apnea She has had spotty compliance with C-pap  Hyperlipidemia LDL 66 May 2017, PCP follows  Bipolar disorder ([place]) On multiple medications  Normal coronary arteries 2007 after an abnormal Cardiolite  Labile hypertension B/P  elevated recently, Norvasc recently increased but still high today in the office.    PLAN  She is at max dose Amlodipine and clonidine. I added low dose ARB. She'll need a f/u B/P and BMP in [place] in the next few weeks. Once her B/P is stable she should f/u with Dr Moshe Cipro.  Kerin Ransom PA-C 08/03/2015 12:14 PM

## 2015-08-03 NOTE — Assessment & Plan Note (Signed)
B/P elevated recently, Norvasc recently increased but still high today in the office.

## 2015-08-03 NOTE — Assessment & Plan Note (Signed)
2007 after an abnormal Cardiolite

## 2015-08-03 NOTE — Assessment & Plan Note (Signed)
She has had spotty compliance with C-pap

## 2015-08-03 NOTE — Patient Instructions (Signed)
Your physician has recommended you make the following change in your medication: START losartan 25mg  once daily  Your physician recommends that you schedule a follow-up appointment in: 2 weeks with MD or NP in [place] (to establish care)

## 2015-08-03 NOTE — Assessment & Plan Note (Signed)
LDL 66 May 2017, PCP follows

## 2015-08-03 NOTE — Assessment & Plan Note (Signed)
On multiple medications 

## 2015-08-04 ENCOUNTER — Encounter: Payer: Self-pay | Admitting: Obstetrics and Gynecology

## 2015-08-04 ENCOUNTER — Ambulatory Visit (INDEPENDENT_AMBULATORY_CARE_PROVIDER_SITE_OTHER): Payer: Commercial Managed Care - HMO | Admitting: Obstetrics and Gynecology

## 2015-08-04 VITALS — BP 150/60 | HR 67 | Ht 61.0 in | Wt 158.0 lb

## 2015-08-04 DIAGNOSIS — Z09 Encounter for follow-up examination after completed treatment for conditions other than malignant neoplasm: Secondary | ICD-10-CM

## 2015-08-04 DIAGNOSIS — N816 Rectocele: Secondary | ICD-10-CM

## 2015-08-04 NOTE — Telephone Encounter (Signed)
Called and left message for patient to return call.  

## 2015-08-04 NOTE — Progress Notes (Signed)
Patient ID: [name], female   DOB: 04-23-1950, 65 y.o.   MRN: [id number]     Subjective:  [name] is a 65 y.o. female now 6 weeks status post posterior rectocele repair.   Pt reports her bowel incontinence prior to the procedure has improved since the procedure; however, she is still straining to start to pass BMs. Per pt, her bowel movements are accompanied by shooting abdominal pain secondary to straining. She also reports continued soreness to her rectum. She is sore in perineum just to left of midline when sitting  Review of Systems Negative except for abdominal pain with straining, mild soreness to rectum    Diet:   normal   Bowel movements : abnormal with constipation.  Pain is controlled without any medications.  Objective:  BP 150/60 mmHg  Pulse 67  Ht 5\' 1"  (1.549 m)  Wt 158 lb (71.668 kg)  BMI 29.87 kg/m2  LMP  (Exact Date) General:Well developed, well nourished.  No acute distress. Abdomen: Bowel sounds normal, soft, non-tender. Pelvic Exam:    External Genitalia:  Normal.    Vagina: Normal with good posterior support     Cervix: Normal    Uterus: Normal    Adnexa/Bimanual: Normal    Rectal- good anterior support, perineal pressure pain d/t postoperative sensitivity   Incision(s):   Healing well, no drainage, no erythema, no hernia, no swelling, no dehiscence,    Assessment:  Post-Op 6 weeks s/p posterior rectocele repair  Pt with perineal pressure pain d/t postoperative sensitivity   Doing well postoperatively.   Plan:  1.Wound care discussed   2. . current medications: unchanged  3. Activity restrictions: none 4. return to work: not applicable. 5. Follow up in 4 weeks for recheck.   By signing my name below, I, Hansel Feinstein, attest that this documentation has been prepared under the direction and in the presence of Jonnie Kind, MD. Electronically Signed: Hansel Feinstein, ED Scribe. 08/04/2015. 10:28 AM.  I personally performed the services  described in this documentation, which was SCRIBED in my presence. The recorded information has been reviewed and considered accurate. It has been edited as necessary during review. Jonnie Kind, MD

## 2015-08-04 NOTE — Telephone Encounter (Signed)
Patient aware.

## 2015-08-06 ENCOUNTER — Other Ambulatory Visit: Payer: Self-pay | Admitting: *Deleted

## 2015-08-06 DIAGNOSIS — M79609 Pain in unspecified limb: Secondary | ICD-10-CM | POA: Diagnosis not present

## 2015-08-06 NOTE — Patient Outreach (Signed)
Call to [facility] spoke with Karlyne Greenspan regarding patient med box. She will take care of getting medications in box. Royetta Crochet. Laymond Purser, RN, BSN, [place] [phone]

## 2015-08-06 NOTE — Patient Outreach (Signed)
Call to patient home.  Spoke with patient. Patient states she is still having issues from her surgery. Was told by MD that it would "take a while to get back to normal" due to her other health related issues. Patient also states she is having issues with her BP, states it has been running high. They have changed her medications for her BP, also, her Aciphex is to be between meals and not with her vitamins.  Reminded patient to call the home care agency when the medications change and the box needs updating, especially for something as important as a heart or BP medication.  Plan:  RNCM will call [facility] agency regarding med box RNCM will visit next week. Royetta Crochet. Laymond Purser, RN, BSN, [place] [phone]

## 2015-08-09 DIAGNOSIS — R110 Nausea: Secondary | ICD-10-CM | POA: Insufficient documentation

## 2015-08-09 DIAGNOSIS — Z87898 Personal history of other specified conditions: Secondary | ICD-10-CM | POA: Insufficient documentation

## 2015-08-09 NOTE — Assessment & Plan Note (Signed)
zofran administered in office as currently symptomatic

## 2015-08-09 NOTE — Assessment & Plan Note (Signed)
Office EKG done at visit on 07/15/2015 showed NSR, no ischemia

## 2015-08-09 NOTE — Assessment & Plan Note (Signed)
Currently symptomatic, o nystagmus, antivert prescribed for short term, as needed, use

## 2015-08-09 NOTE — Assessment & Plan Note (Signed)
Blood pressure checked personally by me and normal DASH diet and commitment to daily physical activity for a minimum of 30 minutes discussed and encouraged, as a part of hypertension management. The importance of attaining a healthy weight is also discussed.  BP/Weight 08/04/2015 08/03/2015 07/29/2015 07/27/2015 07/15/2015 07/07/2015 [id number]  Systolic BP [id number]  Diastolic BP 60 81 78 74 70 60 60  Wt. (Lbs) 158 155.8 158.6 158.2 157 157 -  BMI 29.87 29.45 32.02 31.94 29.68 29.68 -

## 2015-08-09 NOTE — Assessment & Plan Note (Signed)
[name] is reminded of the importance of commitment to daily physical activity for 30 minutes or more, as able and the need to limit carbohydrate intake to 30 to 60 grams per meal to help with blood sugar control.   The need to take medication as prescribed, test blood sugar as directed, and to call between visits if there is a concern that blood sugar is uncontrolled is also discussed.   [name] is reminded of the importance of daily foot exam, annual eye examination, and good blood sugar, blood pressure and cholesterol control. Treated by endo and well controlled currently  Diabetic Labs Latest Ref Rng 07/19/2015 06/30/2015 06/23/2015 05/31/2015 05/01/2015  HbA1c 4.8 - 5.6 % - - - 6.2(H) -  Microalbumin <2.0 mg/dL - - - - -  Micro/Creat Ratio 0.0 - 30.0 mg/g - - - - -  Chol 125 - 200 mg/dL 136 - - - -  HDL >=46 mg/dL 44(L) - - - -  Calc LDL <130 mg/dL 66 - - - -  Triglycerides <150 mg/dL 131 - - - -  Creatinine 0.50 - 0.99 mg/dL 0.90 0.87 0.94 0.90 1.02(H)  GFR >60.00 mL/min - - - - -   BP/Weight 08/04/2015 08/03/2015 07/29/2015 07/27/2015 07/15/2015 07/07/2015 [id number]  Systolic BP [id number]  Diastolic BP 60 81 78 74 70 60 60  Wt. (Lbs) 158 155.8 158.6 158.2 157 157 -  BMI 29.87 29.45 32.02 31.94 29.68 29.68 -   Foot/eye exam completion dates Latest Ref Rng 10/06/2014 10/03/2013  Eye Exam No Retinopathy No Retinopathy No Retinopathy  Foot exam Order - - -  Foot Form Completion - - -

## 2015-08-10 ENCOUNTER — Ambulatory Visit: Payer: Self-pay | Admitting: *Deleted

## 2015-08-10 ENCOUNTER — Telehealth: Payer: Self-pay | Admitting: Internal Medicine

## 2015-08-10 DIAGNOSIS — J440 Chronic obstructive pulmonary disease with acute lower respiratory infection: Secondary | ICD-10-CM | POA: Diagnosis not present

## 2015-08-10 NOTE — Telephone Encounter (Signed)
Spoke with [name], she is with [facility], she wanted to make sure we knew her amlodipine was increased prior to [name] starting the losartan. Per luke kilroy's office note, we were aware. Pt bp today was 122/60, the pt has not started the losartan yet. She will call with any concerns regarding blood pressure.

## 2015-08-10 NOTE — Telephone Encounter (Signed)
New message    Pt C/O medication issue:  1. Name of Medication: Norvasc   2. How are you currently taking this medication (dosage and times per day)? 10 mg   3. Are you having a reaction (difficulty breathing--STAT)? No   4. What is your medication issue? Please advise on losartan as well as Norvasc.

## 2015-08-11 ENCOUNTER — Telehealth: Payer: Self-pay | Admitting: Internal Medicine

## 2015-08-11 ENCOUNTER — Ambulatory Visit: Payer: Self-pay

## 2015-08-11 ENCOUNTER — Other Ambulatory Visit: Payer: Self-pay | Admitting: Internal Medicine

## 2015-08-11 ENCOUNTER — Encounter: Payer: Self-pay | Admitting: *Deleted

## 2015-08-11 ENCOUNTER — Other Ambulatory Visit: Payer: Self-pay | Admitting: *Deleted

## 2015-08-11 NOTE — Telephone Encounter (Signed)
Spoke with pt. She saw CY on 07/27/15 for a follow up. There was another OV scheduled for 08/16/15. Pt does not want to keep this appointment. This has been canceled. Nothing further was needed.

## 2015-08-11 NOTE — Telephone Encounter (Signed)
Rx Refill

## 2015-08-11 NOTE — Patient Outreach (Signed)
[facility]) Care Management   08/11/2015  [name] Tani Jan 29, 1951 [id number]  [name] is an 65 y.o. female  Subjective:  "my blood pressure has been high" "my surgery is not healed up yet" "my blood sugars are a little higher since the MD cut back on my insulin" "I am going to get my mammogram today" Patient states her endocrinologist is keeping a good eye on her diabetes, states blood sugars were low and Dr. Letta Median cut back on insulin and now they are higher.  Patient states a couple of months ago her BP was low, then it started being high, so they increased medication and added a medication. She is being followed closely by Cardiology and also Primary care.  Patient has home care nurse that is filling medication box Patient has new CAP aide who is working out really well. Patient agrees to referral to [facility] at home and agrees to discharge from [facility] program  Objective: Patient neatly groomed and dressed BP 128/64 mmHg  Pulse 64  Resp 20  Wt 154 lb (69.854 kg)  SpO2 96%  LMP  (Exact Date)   Review of Systems  Constitutional: Negative.   HENT: Negative.   Eyes: Negative.   Respiratory: Negative.   Cardiovascular: Negative.   Gastrointestinal: Negative.   Genitourinary: Negative.   Musculoskeletal: Positive for myalgias, back pain and joint pain.  Skin: Negative.     Physical Exam  Constitutional: She is oriented to person, place, and time. She appears well-developed and well-nourished.  Neck: Normal range of motion.  Cardiovascular: Normal rate.   Respiratory: Effort normal and breath sounds normal.  GI: Soft. Bowel sounds are normal.  Musculoskeletal: Normal range of motion.  Neurological: She is alert and oriented to person, place, and time.  Skin: Skin is warm.    Encounter Medications:   Outpatient Encounter Prescriptions as of 08/11/2015  Medication Sig Note  . losartan (COZAAR) 25 MG tablet Take 1 tablet (25 mg total) by mouth daily.    [name] ACCU-CHEK FASTCLIX LANCETS MISC Use to test blood sugar 4 times daily. Dx: E10.65   . albuterol (PROVENTIL HFA;VENTOLIN HFA) 108 (90 BASE) MCG/ACT inhaler Inhale 1 puff into the lungs 3 (three) times daily.   . Alcohol Swabs (B-D SINGLE USE SWABS REGULAR) PADS Use for injections and glucose testing 8 times daily. Dx: E10.65   . amLODipine (NORVASC) 5 MG tablet Take 2 tablets (10 mg total) by mouth daily.   [name] aspirin EC 81 MG tablet Take 81 mg by mouth daily.   . Azelastine-Fluticasone (DYMISTA) 137-50 MCG/ACT SUSP Place 1 puff into the nose at bedtime. (Patient not taking: Reported on 08/04/2015)   . BD PEN NEEDLE NANO U/F 32G X 4 MM MISC USE FOUR TIMES DAILY   . Blood Glucose Calibration (ACCU-CHEK SMARTVIEW CONTROL) LIQD 1 each by Other route as needed.   . Blood Glucose Monitoring Suppl (ACCU-CHEK NANO SMARTVIEW) W/DEVICE KIT 1 each by Does not apply route daily. Dx: E10.65   . Cholecalciferol (D 5000) 5000 UNITS capsule Take 5,000 Units by mouth daily.   . clobetasol cream (TEMOVATE) 4.09 % Apply 1 application topically daily.   . cloNIDine (CATAPRES) 0.3 MG tablet TAKE 1 TABLET BY MOUTH EVERY EIGHT HOURS. ONCE AT 8AM, 4PM, AND 12 MIDNIGHT.   [name] conjugated estrogens (PREMARIN) vaginal cream Place 1 Applicatorful vaginally 2 (two) times a week.   . diclofenac sodium (VOLTAREN) 1 % GEL Apply 2 g topically daily as needed (Pain).   [name]  dicyclomine (BENTYL) 10 MG capsule TAKE 1 CAPSULE BY MOUTH THREE TIMES DAILY BEFORE MEALS.   . glucose blood (ACCU-CHEK SMARTVIEW) test strip Use to test blood sugar 4 times daily as instructed. Dx: E10.65   . HYDROcodone-acetaminophen (NORCO) 5-325 MG tablet Take 1 tablet by mouth every 6 (six) hours as needed for moderate pain. (Patient not taking: Reported on 08/04/2015)   . hydroxychloroquine (PLAQUENIL) 200 MG tablet Take 200 mg by mouth daily.   . insulin aspart (NOVOLOG FLEXPEN) 100 UNIT/ML FlexPen Inject 14-18 Units into the skin 3 (three) times daily with  meals. 07/05/2015: Recently decreased to 10u small meals and 14u larger meals  . Insulin Glargine (TOUJEO SOLOSTAR) 300 UNIT/ML SOPN Inject 25 Units into the skin at bedtime.   . lamoTRIgine (LAMICTAL) 100 MG tablet TAKE 1 TABLET BY MOUTH TWICE DAILY.   [name] levothyroxine (SYNTHROID, LEVOTHROID) 50 MCG tablet TAKE 1 TABLET BY MOUTH ONCE DAILY AND 1/2 TABLET ON SUNDAYS.   [name] LORazepam (ATIVAN) 0.5 MG tablet Take 1 tablet (0.5 mg total) by mouth 3 (three) times daily.   . meclizine (ANTIVERT) 12.5 MG tablet Take 1 tablet (12.5 mg total) by mouth 3 (three) times daily as needed for dizziness. (Patient not taking: Reported on 08/04/2015)   . methocarbamol (ROBAXIN) 500 MG tablet Take 500 mg by mouth daily.   . metoprolol (LOPRESSOR) 50 MG tablet TAKE 1 TABLET BY MOUTH TWICE DAILY.   . montelukast (SINGULAIR) 10 MG tablet TAKE ONE TABLET BY MOUTH ONCE DAILY.   . Multiple Vitamins-Minerals (ONE-A-DAY 50 PLUS PO) Take 1 tablet by mouth daily.   [name] Nystatin (NYAMYC) 100000 UNIT/GM POWD APPLY TO AFFECTED AREA 4 TIMES DAILY.   [name] polyethylene glycol powder (MIRALAX) powder Take 17 g by mouth daily. To prevent constipation   . pregabalin (LYRICA) 75 MG capsule Take 75 mg by mouth 2 (two) times daily.    . RABEprazole (ACIPHEX) 20 MG tablet TAKE 1 TABLET BY MOUTH TWICE DAILY.   [name] RESTASIS 0.05 % ophthalmic emulsion Place 1 drop into both eyes 2 (two) times daily.    . risperiDONE (RISPERDAL) 0.5 MG tablet Take 1 tablet (0.5 mg total) by mouth at bedtime.   . rosuvastatin (CRESTOR) 5 MG tablet TAKE 1 TABLET BY MOUTH AT BEDTIME.   [name] sertraline (ZOLOFT) 100 MG tablet Take 2 tablets (200 mg total) by mouth daily.   . traZODone (DESYREL) 150 MG tablet Take 2 tablets (300 mg total) by mouth at bedtime.   . vitamin E 400 UNIT capsule Take 400 Units by mouth daily.    No facility-administered encounter medications on file as of 08/11/2015.       Assessment:   Diabetes: Patient reports blood sugars have been higher  because her endocrinologist lowered the dose of her insulins Today she states CBG was 273, otherwise it has been running in low 100's,patient keeping a log to take to endocrinologist appointments Reviewed instructions from endocrinologist and encouraged patient ot let MD know if her CBG's continue to be over 200  HTN: Patient working with MD on medications for BP Today BP is stable, she is seeing new cardiologist and has physical in June with her primary care.  Plan:  [facility] CM Care Plan Problem One        Most Recent Value   Care Plan Problem One  Diabetes-knowledge deficit    Role Documenting the Problem One  Care Management Coordinator   Care Plan for Problem One  Active   [facility]  Long Term Goal (31-90 days)  Patient will be able to verbalize signs and symptoms of low blood sugar within the next 21 days    [facility] Long Term Goal Start Date  07/05/15   Interventions for Problem One Long Term Goal  Reinstructed on diabetes diet information packet   [facility] CM Short Term Goal #1 (0-30 days)  Patient will monitor and record CBGs on her log and take to endocrinologist appoointments    [facility] CM Short Term Goal #1 Start Date  07/05/15   [facility] CM Short Term Goal #1 Met Date  08/11/15 [patient keeping blood sugar log]   Interventions for Short Term Goal #1  Reviewed blood sugar log    [facility] CM Care Plan Problem Two        Most Recent Value   Care Plan for Problem Two  Not Active     Refer to [employer] at home Program for ongoing disease management and case management  Will close out RNCM case Stanton Kidney E. Laymond Purser, RN, BSN, [place] [phone]

## 2015-08-15 IMAGING — US US SOFT TISSUE HEAD/NECK
1 series · 13 of 25 positions shown · non-contrast
Comparison: Biopsy study 10/29/2012, 10/11/2012

CLINICAL DATA: 63-year-old female with low thyroid lab values.

EXAM:
THYROID ULTRASOUND
TECHNIQUE: Ultrasound examination of the thyroid gland and adjacent soft
tissues was performed.

[Series 1: us soft tissue head/neck · 0.09mm/px · 13 of 55 slices shown]
[im 1/55]
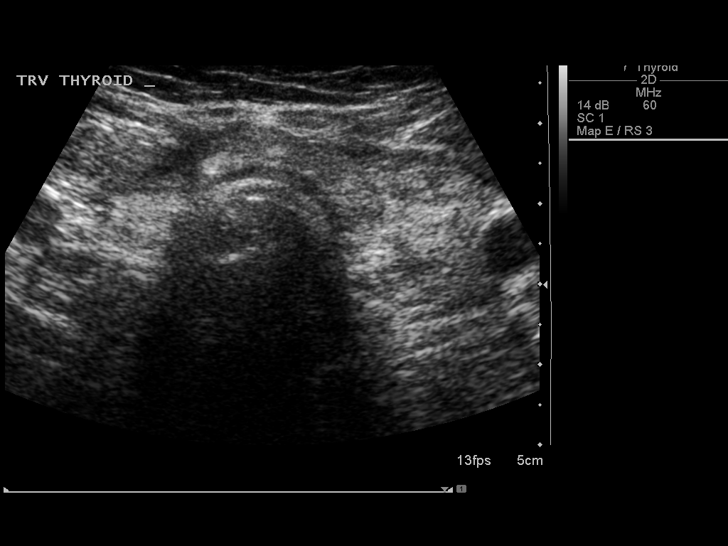
[im 5/55]
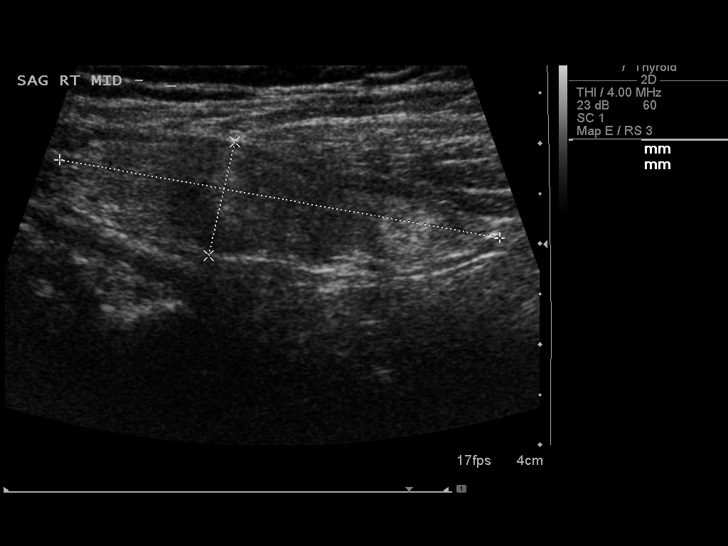
[im 10/55]
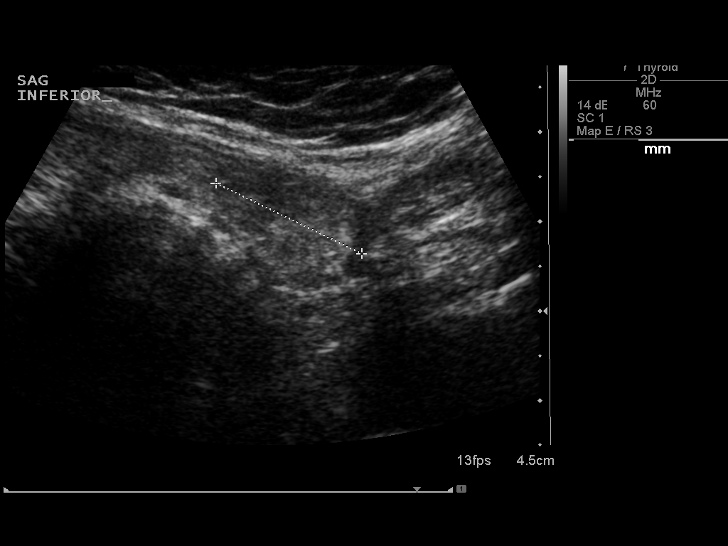
[im 14/55]
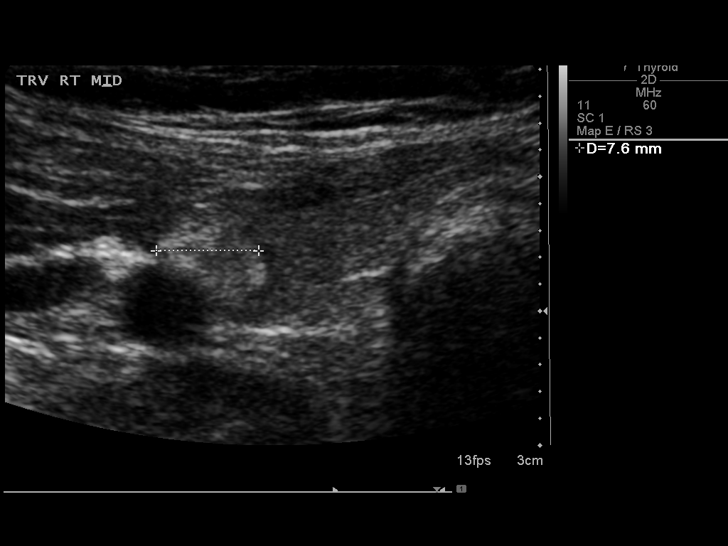
[im 19/55]
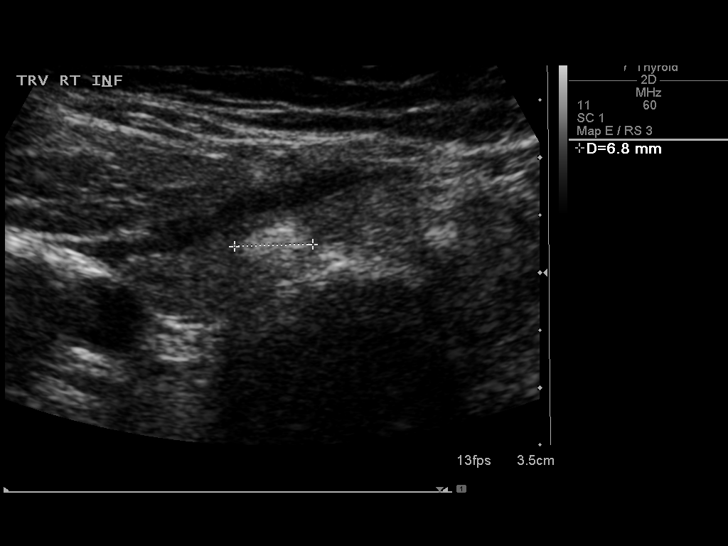
[im 23/55]
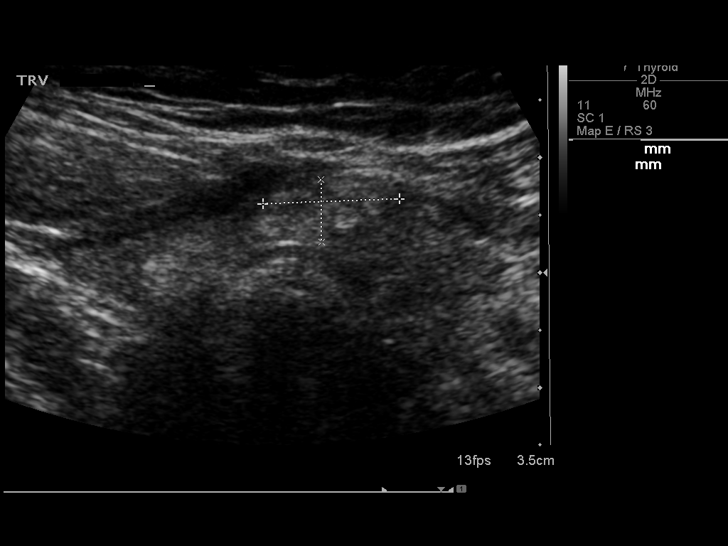
[im 28/55]
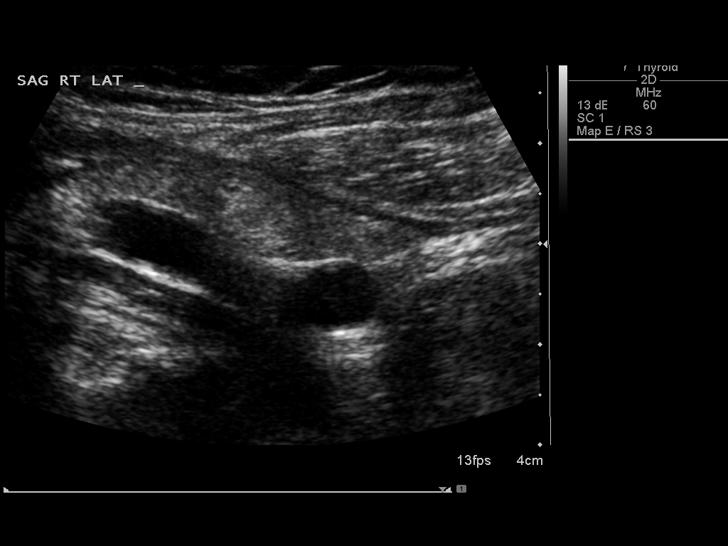
[im 32/55]
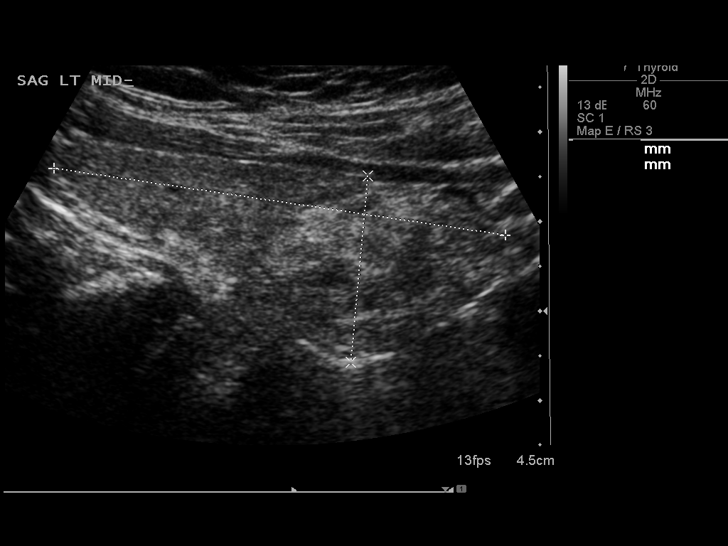
[im 37/55]
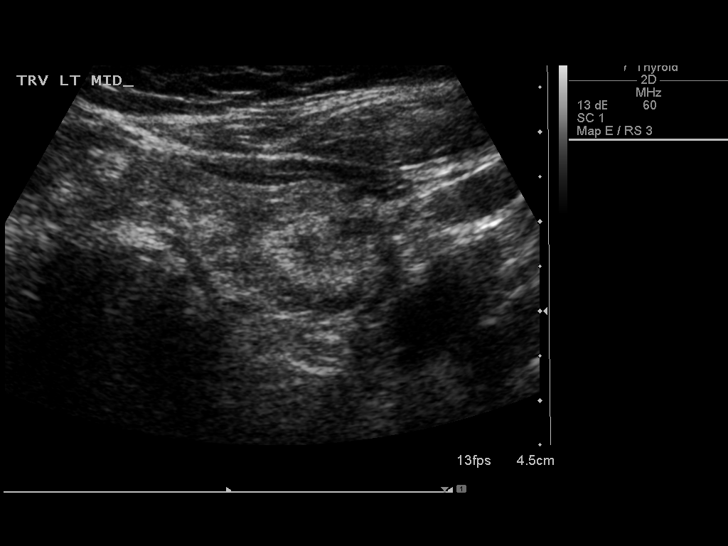
[im 41/55]
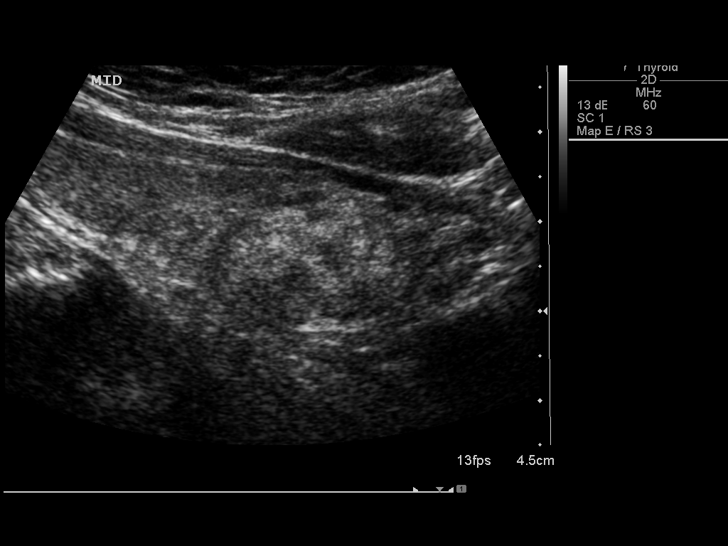
[im 46/55]
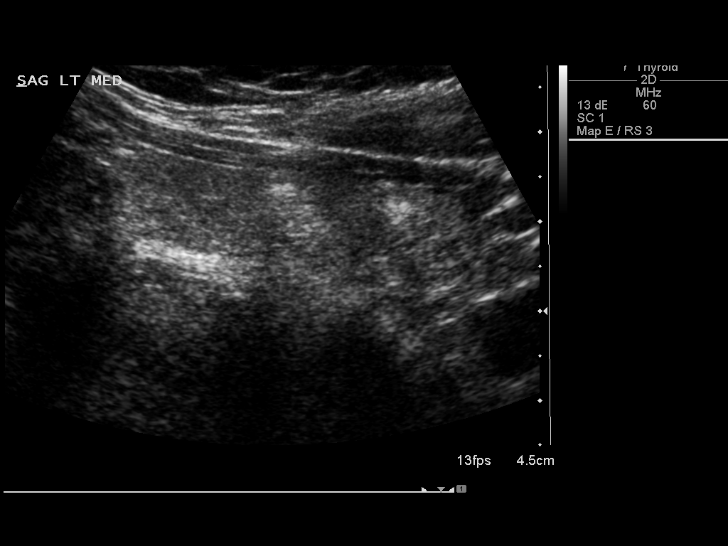
[im 50/55]
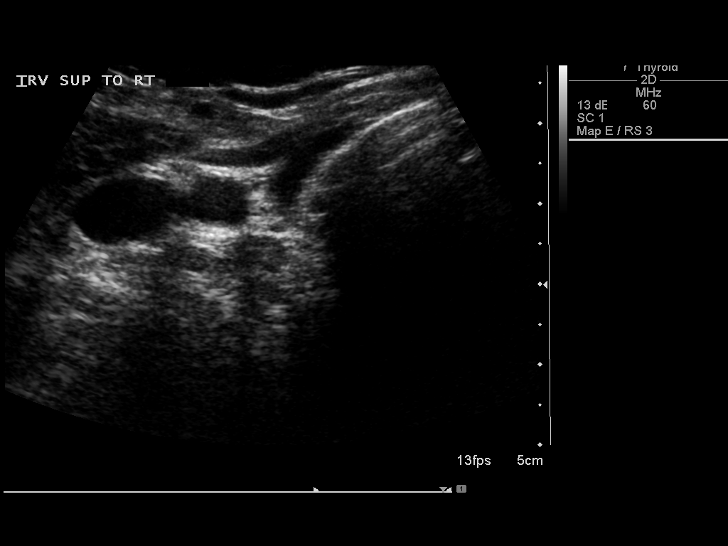
[im 55/55]
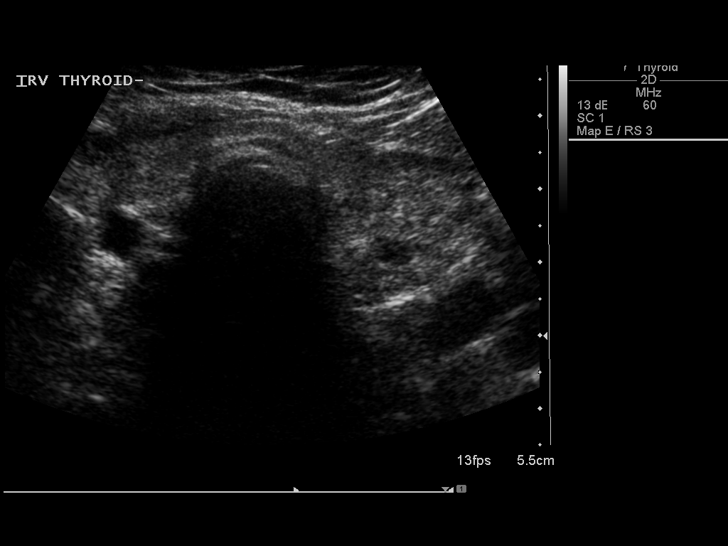

[13 of 25 positions shown; findings below may reference images not displayed]

FINDINGS: Right thyroid lobe

Measurements: 4.4 cm x 1.2 cm x 1.7 cm. Multiple right-sided nodules
identified.

Each right-sided nodule demonstrates increased echogenicity, with
the superior measuring 7 mm -8 mm, most inferior measuring 9 mm - 10
mm. A small, 3 mm focus of calcium with posterior shadowing is
evident. There is also a mid nodule measuring 7 mm, which is
echogenic.

Left thyroid lobe

Measurements: 5.1 cm x 2.1 cm x 2.3 cm. Dominant lesion at the
inferior pole of left thyroid is again evident, which has been
previously biopsied (10/29/2012). This nodule measures 1.8 cm x
cm x 2.5 cm. (Previous 1.4 cm x 2.1 cm x 1.4 cm)

Isthmus

Thickness: 4 mm-5 mm. Isthmic nodule again noted, previously
biopsied (10/29/2012). Currently this nodule measures 2.2 cm x
cm x 1.8 cm. (previous measurement 2.2 cm x 1.1 cm x 1.7 cm).

There is a new nodule identified within the isthmus with
heterogeneously hyperechoic characteristics. This nodule measures 12
mm x 5.4 mm x 7.2 mm.

Lymphadenopathy

None visualized.
IMPRESSION: Multinodular goiter again evident.

The previously biopsied lesions at the inferior isthmus and the left
lower thyroid have grown slightly in the interval.

A new hyperechoic nodule within the isthmus is identified on the
current, which does not meet criteria for biopsy.

None of the right thyroid lobe lesions meet criteria for biopsy.

Follow-up by clinical exam is recommended. If patient has known risk
factors for thyroid carcinoma, consider follow-up ultrasound in 12
months. If patient is clinically hyperthyroid, consider nuclear
medicine thyroid uptake and scan.

Reference: Management of Thyroid Nodules Detected at US: Society of
Radiologists in Ultrasound Consensus Conference Statement. Radiology
6888; [DATE].

## 2015-08-16 ENCOUNTER — Other Ambulatory Visit (HOSPITAL_COMMUNITY): Payer: Self-pay | Admitting: Psychiatry

## 2015-08-16 ENCOUNTER — Ambulatory Visit: Payer: Self-pay | Admitting: Internal Medicine

## 2015-08-16 ENCOUNTER — Telehealth (HOSPITAL_COMMUNITY): Payer: Self-pay | Admitting: *Deleted

## 2015-08-16 DIAGNOSIS — J440 Chronic obstructive pulmonary disease with acute lower respiratory infection: Secondary | ICD-10-CM | POA: Diagnosis not present

## 2015-08-16 MED ORDER — TRAZODONE HCL 150 MG PO TABS: 300.0000 mg | ORAL_TABLET | Freq: Every day | ORAL | Status: DC

## 2015-08-16 NOTE — Telephone Encounter (Signed)
Sent, she needs appt

## 2015-08-16 NOTE — Telephone Encounter (Signed)
Called pt due to previous call. Informed pt that script was called into her pharmacy and she showed understanding.

## 2015-08-16 NOTE — Telephone Encounter (Signed)
voice message from patient, she need refill of Trazodone.

## 2015-08-17 ENCOUNTER — Ambulatory Visit (INDEPENDENT_AMBULATORY_CARE_PROVIDER_SITE_OTHER): Payer: Commercial Managed Care - HMO | Admitting: Adult Health

## 2015-08-17 ENCOUNTER — Encounter: Payer: Self-pay | Admitting: *Deleted

## 2015-08-17 ENCOUNTER — Encounter: Payer: Self-pay | Admitting: Adult Health

## 2015-08-17 ENCOUNTER — Telehealth (HOSPITAL_COMMUNITY): Payer: Self-pay | Admitting: *Deleted

## 2015-08-17 ENCOUNTER — Other Ambulatory Visit (HOSPITAL_COMMUNITY): Payer: Self-pay | Admitting: Psychiatry

## 2015-08-17 VITALS — BP 124/60 | HR 63 | Ht 61.0 in | Wt 160.0 lb

## 2015-08-17 DIAGNOSIS — R0602 Shortness of breath: Secondary | ICD-10-CM | POA: Diagnosis not present

## 2015-08-17 DIAGNOSIS — Z720 Tobacco use: Secondary | ICD-10-CM

## 2015-08-17 DIAGNOSIS — I10 Essential (primary) hypertension: Secondary | ICD-10-CM

## 2015-08-17 DIAGNOSIS — R0789 Other chest pain: Secondary | ICD-10-CM

## 2015-08-17 MED ORDER — LORAZEPAM 0.5 MG PO TABS: 0.5000 mg | ORAL_TABLET | Freq: Three times a day (TID) | ORAL | Status: DC

## 2015-08-17 NOTE — Telephone Encounter (Signed)
printed

## 2015-08-17 NOTE — Progress Notes (Signed)
Name: [name]    DOB: 03-04-1951  Age: 65 y.o.  MR#: [id number]       PCP:  Tula Nakayama, MD      Insurance: Payor: [employer] Rossetti MEDICARE / Plan: [place] THN/NTSP / Product Type: *No Product type* /   CC:   No chief complaint on file.   VS Filed Vitals:   08/17/15 1342  BP: 124/60  Pulse: 63  Height: _0  (1.549 m)  Weight: 160 lb (72.576 kg)  SpO2: 97%    Weights Current Weight  08/17/15 160 lb (72.576 kg)  08/11/15 154 lb (69.854 kg)  08/04/15 158 lb (71.668 kg)    Blood Pressure  BP Readings from Last 3 Encounters:  08/17/15 124/60  08/11/15 128/64  08/04/15 150/60     Admit date:  (Not on file) Last encounter with RMR:  Visit date not found   Allergy Cephalexin; Iron; Milk-related compounds; Penicillins; and Phenazopyridine hcl  Current Outpatient Prescriptions  Medication Sig Dispense Refill  . ACCU-CHEK FASTCLIX LANCETS MISC Use to test blood sugar 4 times daily. Dx: E10.65 408 each 1  . albuterol (PROVENTIL HFA;VENTOLIN HFA) 108 (90 BASE) MCG/ACT inhaler Inhale 1 puff into the lungs 3 (three) times daily. 1 Inhaler 2  . Alcohol Swabs (B-D SINGLE USE SWABS REGULAR) PADS Use for injections and glucose testing 8 times daily. Dx: E10.65 900 each 1  . amLODipine (NORVASC) 5 MG tablet Take 2 tablets (10 mg total) by mouth daily. 60 tablet 3  . aspirin EC 81 MG tablet Take 81 mg by mouth daily.    . Azelastine-Fluticasone (DYMISTA) 137-50 MCG/ACT SUSP Place 1 puff into the nose at bedtime. 1 Bottle 0  . BD PEN NEEDLE NANO U/F 32G X 4 MM MISC USE FOUR TIMES DAILY 360 each 3  . Blood Glucose Calibration (ACCU-CHEK SMARTVIEW CONTROL) LIQD 1 each by Other route as needed. 1 each 2  . Blood Glucose Monitoring Suppl (ACCU-CHEK NANO SMARTVIEW) W/DEVICE KIT 1 each by Does not apply route daily. Dx: E10.65 1 kit 0  . Cholecalciferol (D 5000) 5000 UNITS capsule Take 5,000 Units by mouth daily.    . clobetasol cream (TEMOVATE) 2.20 % Apply 1 application topically  daily.    . cloNIDine (CATAPRES) 0.3 MG tablet TAKE 1 TABLET BY MOUTH EVERY EIGHT HOURS. ONCE AT 8AM, 4PM, AND 12 MIDNIGHT. 90 tablet 3  . conjugated estrogens (PREMARIN) vaginal cream Place 1 Applicatorful vaginally 2 (two) times a week. 30 g 1  . diclofenac sodium (VOLTAREN) 1 % GEL Apply 2 g topically daily as needed (Pain). 100 g 2  . dicyclomine (BENTYL) 10 MG capsule TAKE 1 CAPSULE BY MOUTH THREE TIMES DAILY BEFORE MEALS. 90 capsule 2  . glucose blood (ACCU-CHEK SMARTVIEW) test strip Use to test blood sugar 4 times daily as instructed. Dx: E10.65 375 each 1  . HYDROcodone-acetaminophen (NORCO) 5-325 MG tablet Take 1 tablet by mouth every 6 (six) hours as needed for moderate pain. 30 tablet 0  . hydroxychloroquine (PLAQUENIL) 200 MG tablet Take 200 mg by mouth daily.    . insulin aspart (NOVOLOG FLEXPEN) 100 UNIT/ML FlexPen Inject 14-18 Units into the skin 3 (three) times daily with meals. 30 mL 1  . Insulin Glargine (TOUJEO SOLOSTAR) 300 UNIT/ML SOPN Inject 25 Units into the skin at bedtime. 6 pen 1  . lamoTRIgine (LAMICTAL) 100 MG tablet TAKE 1 TABLET BY MOUTH TWICE DAILY. 60 tablet 5  . levothyroxine (SYNTHROID, LEVOTHROID) 50 MCG tablet  TAKE 1 TABLET BY MOUTH ONCE DAILY AND 1/2 TABLET ON SUNDAYS. 30 tablet 2  . LORazepam (ATIVAN) 0.5 MG tablet Take 1 tablet (0.5 mg total) by mouth 3 (three) times daily. 90 tablet 2  . losartan (COZAAR) 25 MG tablet Take 1 tablet (25 mg total) by mouth daily. 30 tablet 5  . meclizine (ANTIVERT) 12.5 MG tablet Take 1 tablet (12.5 mg total) by mouth 3 (three) times daily as needed for dizziness. 15 tablet 0  . methocarbamol (ROBAXIN) 500 MG tablet Take 500 mg by mouth daily.    . metoprolol (LOPRESSOR) 50 MG tablet TAKE 1 TABLET BY MOUTH TWICE DAILY. 180 tablet 0  . montelukast (SINGULAIR) 10 MG tablet TAKE ONE TABLET BY MOUTH ONCE DAILY. 30 tablet 3  . Multiple Vitamins-Minerals (ONE-A-DAY 50 PLUS PO) Take 1 tablet by mouth daily.    [name] Nystatin (NYAMYC)  100000 UNIT/GM POWD APPLY TO AFFECTED AREA 4 TIMES DAILY. 30 g 2  . polyethylene glycol powder (MIRALAX) powder Take 17 g by mouth daily. To prevent constipation 255 g prn  . pregabalin (LYRICA) 75 MG capsule Take 75 mg by mouth 2 (two) times daily.     . RABEprazole (ACIPHEX) 20 MG tablet TAKE 1 TABLET BY MOUTH TWICE DAILY. 60 tablet 5  . RESTASIS 0.05 % ophthalmic emulsion Place 1 drop into both eyes 2 (two) times daily.     . risperiDONE (RISPERDAL) 0.5 MG tablet Take 1 tablet (0.5 mg total) by mouth at bedtime. 30 tablet 3  . rosuvastatin (CRESTOR) 5 MG tablet TAKE 1 TABLET BY MOUTH AT BEDTIME. 30 tablet 3  . sertraline (ZOLOFT) 100 MG tablet Take 2 tablets (200 mg total) by mouth daily. 60 tablet 3  . traZODone (DESYREL) 150 MG tablet Take 2 tablets (300 mg total) by mouth at bedtime. 60 tablet 3  . vitamin E 400 UNIT capsule Take 400 Units by mouth daily.     No current facility-administered medications for this visit.    Discontinued Meds:   There are no discontinued medications.  Patient Active Problem List   Diagnosis Date Noted  . History of palpitations 08/09/2015  . Nausea without vomiting 08/09/2015  . Labile hypertension 08/03/2015  . Normal coronary arteries 08/03/2015  . Type 2 diabetes mellitus with hyperglycemia, with long-term current use of insulin ([place]) 07/29/2015  . Vertigo 07/15/2015  . Rectocele 06/29/2015  . Left-sided low back pain with left-sided sciatica 06/27/2015  . Left flank pain 06/27/2015  . Poorly controlled type 2 diabetes mellitus with circulatory disorder ([place]) 05/06/2015  . Multinodular goiter 05/06/2015  . Rectocele, female 04/27/2015  . Anal sphincter incontinence 04/27/2015  . Pelvic relaxation due to rectocele 03/30/2015  . Conjunctivitis of left eye 03/02/2015  . Hyperkalemia 02/22/2015  . Pulmonary hypertension ([place]) 02/22/2015  . LUE weakness   . Chest pain 02/21/2015  . Chest tightness 02/21/2015  . Weakness of left upper extremity  02/21/2015  . AKI (acute kidney injury) ([place]) 02/21/2015  . Light cigarette smoker (1-9 cigarettes per day) 01/11/2015  . Rib pain on left side 12/30/2014  . Light headedness 10/20/2014  . Migraine without aura and without status migrainosus, not intractable 07/02/2014  . Flatulence 02/18/2014  . Microcytic anemia 02/18/2014  . COPD (chronic obstructive pulmonary disease) with chronic bronchitis ([place]) 09/16/2013  . Hypothyroidism 08/16/2013  . Gastroparesis 04/28/2013  . Seizure disorder ([place]) 01/19/2013  . Cervical disc disorder with radiculopathy of cervical region 10/31/2012  . Solitary pulmonary nodule 08/19/2012  .  Anemia 07/05/2012  . 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 Allergic sinusitis 04/18/2012  . Meningioma ([place]) 11/19/2011  . 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 Hemiplegia affecting non-dominant side, post-stroke ([place]) 08/02/2011  . Carpal tunnel syndrome of right wrist 05/23/2011  . Polypharmacy 04/28/2011  . Bipolar disorder ([place]) 04/28/2011  . Constipation 04/13/2011  . Falls frequently 12/12/2010  . Urinary incontinence 12/16/2009  . HEARING LOSS 10/26/2009  . Hyperlipidemia 12/11/2008  . IBS 12/11/2008  . GERD 07/29/2008  . MILK PRODUCTS ALLERGY 07/29/2008  . 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 Essential hypertension 06/27/2007  . Backache 06/19/2007  . Osteoporosis 06/19/2007  . Obstructive sleep apnea 06/19/2007  . TRIGGER FINGER 04/18/2007  . DIVERTICULOSIS, COLON 11/13/2006    LABS    Component Value Date/Time   NA 141 07/19/2015 0925   NA 143 06/30/2015 0553   NA 142 06/23/2015 1145   K 4.0 07/19/2015 0925   K 4.0 06/30/2015 0553   K 3.8 06/23/2015 1145   CL 105 07/19/2015 0925   CL 113* 06/30/2015 0553   CL 110 06/23/2015 1145   CO2 28 07/19/2015 0925   CO2 27 06/30/2015 0553   CO2 25 06/23/2015 1145   GLUCOSE 298* 07/19/2015 0925   GLUCOSE 51* 06/30/2015 0553    GLUCOSE 51* 06/23/2015 1145   BUN 23 07/19/2015 0925   BUN 23* 06/30/2015 0553   BUN 25* 06/23/2015 1145   CREATININE 0.90 07/19/2015 0925   CREATININE 0.87 06/30/2015 0553   CREATININE 0.94 06/23/2015 1145   CREATININE 0.90 05/31/2015 0630   CREATININE 1.23* 03/02/2015 0930   CREATININE 1.12* 12/30/2014 1219   CALCIUM 8.8 07/19/2015 0925   CALCIUM 8.4* 06/30/2015 0553   CALCIUM 9.0 06/23/2015 1145   GFRNONAA 67 07/19/2015 0925   GFRNONAA >60 06/30/2015 0553   GFRNONAA >60 06/23/2015 1145   GFRNONAA >60 05/31/2015 0630   GFRNONAA 46* 03/02/2015 0930   GFRNONAA 52* 12/30/2014 1219   GFRAA 78 07/19/2015 0925   GFRAA >60 06/30/2015 0553   GFRAA >60 06/23/2015 1145   GFRAA >60 05/31/2015 0630   GFRAA 54* 03/02/2015 0930   GFRAA 60 12/30/2014 1219   CMP     Component Value Date/Time   NA 141 07/19/2015 0925   K 4.0 07/19/2015 0925   CL 105 07/19/2015 0925   CO2 28 07/19/2015 0925   GLUCOSE 298* 07/19/2015 0925   BUN 23 07/19/2015 0925   CREATININE 0.90 07/19/2015 0925   CREATININE 0.87 06/30/2015 0553   CALCIUM 8.8 07/19/2015 0925   PROT 6.1 07/19/2015 0925   ALBUMIN 3.6 07/19/2015 0925   AST 13 07/19/2015 0925   ALT 12 07/19/2015 0925   ALKPHOS 146* 07/19/2015 0925   BILITOT 0.2 07/19/2015 0925   GFRNONAA 67 07/19/2015 0925   GFRNONAA >60 06/30/2015 0553   GFRAA 78 07/19/2015 0925   GFRAA >60 06/30/2015 0553       Component Value Date/Time   WBC 7.1 06/30/2015 0553   WBC 6.2 06/23/2015 1145   WBC 6.8 05/31/2015 0630   HGB 9.6* 06/30/2015 0553   HGB 10.8* 06/23/2015 1145   HGB 11.0* 05/31/2015 0630   HCT 31.1* 06/30/2015 0553   HCT 35.4* 06/23/2015 1145   HCT 36.5 05/31/2015 0630   MCV 74.8* 06/30/2015 0553   MCV 75.0* 06/23/2015 1145   MCV 75.1* 05/31/2015 0630    Lipid Panel     Component Value Date/Time   CHOL 136 07/19/2015  0925   TRIG 131 07/19/2015 0925   HDL 44* 07/19/2015 0925   CHOLHDL 3.1 07/19/2015 0925   VLDL 26 07/19/2015 0925   LDLCALC  66 07/19/2015 0925    ABG    Component Value Date/Time   PHART 7.308* 02/15/2012 1024   PCO2ART 51.9* 02/15/2012 1024   PO2ART 67.5* 02/15/2012 1024   HCO3 25.3* 02/15/2012 1024   TCO2 24 02/21/2015 1044   ACIDBASEDEF 0.2 02/15/2012 1024   O2SAT 91.4 02/15/2012 1024     Lab Results  Component Value Date   TSH 1.207 02/21/2015   BNP (last 3 results) No results for input(s): BNP in the last 8760 hours.  ProBNP (last 3 results) No results for input(s): PROBNP in the last 8760 hours.  Cardiac Panel (last 3 results) No results for input(s): CKTOTAL, CKMB, TROPONINI, RELINDX in the last 72 hours.  Iron/TIBC/Ferritin/ %Sat    Component Value Date/Time   IRON 44 07/15/2014 1233   TIBC 343 07/15/2014 1233   FERRITIN 76 07/15/2014 1233   IRONPCTSAT 13* 07/15/2014 1233   IRONPCTSAT 17* 06/05/2014 0607     EKG Orders placed or performed in visit on 08/03/15  . EKG 12-Lead   *Note: Due to a large number of results and/or encounters for the requested time period, some results have not been displayed. A complete set of results can be found in Results Review.     Prior Assessment and Plan Problem List as of 08/17/2015      Cardiovascular and Mediastinum   Essential hypertension   Last Assessment & Plan 07/15/2015 Office Visit Written 08/09/2015  1:28 PM by [name], MD    Blood pressure checked personally by me and normal DASH diet and commitment to daily physical activity for a minimum of 30 minutes discussed and encouraged, as a part of hypertension management. The importance of attaining a healthy weight is also discussed.  BP/Weight 08/04/2015 08/03/2015 07/29/2015 07/27/2015 07/15/2015 07/07/2015 7/78/2423  Systolic BP 536 144 315 400 867 619 509  Diastolic BP 60 81 78 74 70 60 60  Wt. (Lbs) 158 155.8 158.6 158.2 157 157 -  BMI 29.87 29.45 32.02 31.94 29.68 29.68 -            Migraine without aura and without status migrainosus, not intractable   Last Assessment  & Plan 10/20/2014 Office Visit Written 12/13/2014  8:01 AM by [name], MD    No recent flare adequately controlled      Pulmonary hypertension ([place])   Poorly controlled type 2 diabetes mellitus with circulatory disorder [facility])   Last Assessment & Plan 07/15/2015 Office Visit Written 08/09/2015  1:29 PM by [name], MD    Ms. Carvey is reminded of the importance of commitment to daily physical activity for 30 minutes or more, as able and the need to limit carbohydrate intake to 30 to 60 grams per meal to help with blood sugar control.   The need to take medication as prescribed, test blood sugar as directed, and to call between visits if there is a concern that blood sugar is uncontrolled is also discussed.   Ms. Windhorst is reminded of the importance of daily foot exam, annual eye examination, and good blood sugar, blood pressure and cholesterol control. Treated by endo and well controlled currently  Diabetic Labs Latest Ref Rng 07/19/2015 06/30/2015 06/23/2015 05/31/2015 05/01/2015  HbA1c 4.8 - 5.6 % - - - 6.2(H) -  Microalbumin <2.0 mg/dL - - - - -  Micro/Creat Ratio 0.0 - 30.0 mg/g - - - - -  Chol 125 - 200 mg/dL 136 - - - -  HDL >=46 mg/dL 44(L) - - - -  Calc LDL <130 mg/dL 66 - - - -  Triglycerides <150 mg/dL 131 - - - -  Creatinine 0.50 - 0.99 mg/dL 0.90 0.87 0.94 0.90 1.02(H)  GFR >60.00 mL/min - - - - -   BP/Weight 08/04/2015 08/03/2015 07/29/2015 07/27/2015 07/15/2015 07/07/2015 3/97/6734  Systolic BP 193 790 240 973 532 992 426  Diastolic BP 60 81 78 74 70 60 60  Wt. (Lbs) 158 155.8 158.6 158.2 157 157 -  BMI 29.87 29.45 32.02 31.94 29.68 29.68 -   Foot/eye exam completion dates Latest Ref Rng 10/06/2014 10/03/2013  Eye Exam No Retinopathy No Retinopathy No Retinopathy  Foot exam Order - - -  Foot Form Completion - - -             Labile hypertension   Last Assessment & Plan 08/03/2015 Office Visit Written 08/03/2015 12:14 PM by Erlene Quan, PA-C    B/P elevated  recently, Norvasc recently increased but still high today in the office.        Respiratory   Obstructive sleep apnea   Last Assessment & Plan 08/03/2015 Office Visit Written 08/03/2015 11:57 AM by Erlene Quan, PA-C    She has had spotty compliance with C-pap      Allergic sinusitis   Last Assessment & Plan 04/22/2014 Office Visit Written 04/27/2014  3:24 AM by [name], MD    Controlled, no change in medication Takes daily singulair       COPD (chronic obstructive pulmonary disease) with chronic bronchitis [facility])   Last Assessment & Plan 07/27/2015 Office Visit Written 07/27/2015  3:50 PM by Deneise Lever, MD    Better effort on spirometry at this visit compared with 2015. I still suspect submaximal numbers due to effort.        Digestive   GERD   Last Assessment & Plan 10/30/2013 Office Visit Written 10/30/2013  9:45 PM by Deneise Lever, MD    Review and emphasis on reflux precaution      DIVERTICULOSIS, COLON   IBS   Last Assessment & Plan 02/18/2014 Office Visit Written 02/18/2014  3:06 PM by Mahala Menghini, PA-C    Patient complains of constipation, described as difficulty passing bowel movement although bowel movements occur most days. Previously intolerant to Amitiza and Linzess due to fecal incontinence associated with urgency and loose bowel movements. She reports Hemoccult-positive stool recently although I do not have documentation of that. She has chronic microcytic anemia, hemoglobin slightly better a couple months ago. Complete workup including EGD, colonoscopy, Givens capsule study as previously outlined.  At this time I would like for her to stop Bentyl altogether as long she is having issues with constipation. Trial of Beano for gas. FODMAP table provided and patient asked to limit foods as shown. Ifobt. Further recommendations to follow.       Constipation   Last Assessment & Plan 08/20/2014 Office Visit Edited 08/23/2014  9:54 PM by Mahala Menghini, PA-C     Hold bentyl to see if bowel function normalizes. Use narcotics sparingly. Return to the office in eight weeks.       Gastroparesis   Last Assessment & Plan 08/20/2014 Office Visit Written 08/23/2014  9:58 PM by Mahala Menghini, PA-C    Encouraged tight glycemic control and minimize  narcotics. Antiemetics as needed. Patient request phenergan as opposed to zofran reported better efficacy. Low dose phenergan provided. Return to the office in 8 weeks.       Pelvic relaxation due to rectocele   Rectocele, female   Last Assessment & Plan 05/10/2015 Office Visit Written 05/10/2015  1:06 PM by [name], MD    Repair planned for March, 2017      Rectocele     Endocrine   Hypothyroidism   Last Assessment & Plan 05/19/2015 Office Visit Written 06/28/2015 12:05 AM by [name], MD    Managed by endo      Type 2 diabetes mellitus with hyperglycemia, with long-term current use of insulin [facility])   Last Assessment & Plan 08/03/2015 Office Visit Written 08/03/2015 11:56 AM by Erlene Quan, PA-C    Recently seen by Dr Cruzita Lederer- Amlodipine increased      Multinodular goiter     Nervous and Auditory   Hemiplegia affecting non-dominant side, post-stroke [facility])   Last Assessment & Plan 03/02/2015 Office Visit Written 04/27/2015  6:04 AM by [name], MD    Out patient therapy arranged while in hospital for improved strengthening      HEARING LOSS   Carpal tunnel syndrome of right wrist   Mononeuritis leg   Meningioma [facility])   Last Assessment & Plan 11/16/2011 Office Visit Written 11/19/2011  7:43 AM by [name], MD    Upcoming surgery in the next 1 week planned      Seizure disorder [facility])   Last Assessment & Plan 12/30/2014 Office Visit Written 01/30/2015  8:41 PM by [name], MD    Controlled, no change in medication         Musculoskeletal and Integument   TRIGGER FINGER   Osteoporosis   Cervical disc disorder with radiculopathy of cervical region   Last  Assessment & Plan 04/22/2014 Office Visit Written 04/27/2014  3:28 AM by [name], MD    Chronic pain management through pain center        Genitourinary   AKI (acute kidney injury) [facility])     Other   Hyperlipidemia   Last Assessment & Plan 08/03/2015 Office Visit Written 08/03/2015 11:58 AM by Erlene Quan, PA-C    LDL 66 May 2017, PCP follows      Polypharmacy   Last Assessment & Plan 08/03/2015 Office Visit Written 08/03/2015 11:55 AM by Erlene Quan, PA-C    Multiple medications for multiple medical and psychiatric issues- ? 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   Last Assessment & Plan 09/16/2013 Office Visit Written 10/14/2013 10:49 PM by [name], MD    Stable and treated by mental health      Backache   Last Assessment & Plan 10/20/2014 Office Visit Written 12/13/2014  8:03 AM by [name], MD    Chronic pain management through pain clinic      Urinary incontinence   Last Assessment & Plan 03/02/2015 Office Visit Written 04/27/2015  6:13 AM by [name], MD    Uncontrolled and problematic, start medication and pt ed provided      MILK PRODUCTS ALLERGY   Falls frequently  Last Assessment & Plan 12/30/2014 Office Visit Written 01/30/2015  8:41 PM by [name], MD    New rib FRACTURE AND KNEE TRAUMA FOLLOWING RECENT FALL. HOME SAFETY REVIEWED AT VISIT      Hypersomnia disorder related to a known organic factor   Last Assessment & Plan 12/26/2013 Office Visit Written 12/26/2013  8:56 PM by Deneise Lever, MD    She seems to be functioning better today      Anemia   Last Assessment & Plan 07/04/2012 Office Visit Written 07/05/2012  8:51 PM by Orvil Feil, NP    Chronic, with drop in Hgb from 12 to 9 range  recently. Question if true result, as she tends to remain in the 9-10 range. No overt signs of GI bleeding, EGD fairly recent but last colonoscopy in 2011.  Repeat CBC Add ferritin, iron Consider ifobt, updated colonoscopy       Solitary pulmonary nodule   Last Assessment & Plan 12/26/2013 Office Visit Written 12/26/2013  8:55 PM by Deneise Lever, MD    CXR not showing a progressive process      Flatulence   Microcytic anemia   Light headedness   Last Assessment & Plan 10/20/2014 Office Visit Written 12/13/2014  7:59 AM by [name], MD    Recurrent light headedness with imbalance and falls, imaging of carotid arteries neeeded      Rib pain on left side   Last Assessment & Plan 12/30/2014 Office Visit Written 01/30/2015  8:34 PM by [name], MD    Increased and uncontrolled pain following recent fall which has resulted in fracture. Adjustment made in pain medication short term to manage her acute pain      Light cigarette smoker (1-9 cigarettes per day)   Last Assessment & Plan 05/19/2015 Office Visit Written 06/28/2015 12:06 AM by [name], MD    Patient counseled for approximately 5 minutes regarding the health risks of ongoing nicotine use, specifically all types of cancer, heart disease, stroke and respiratory failure. The options available for help with cessation ,the behavioral changes to assist the process, and the option to either gradully reduce usage  Or abruptly stop.is also discussed. Pt is also encouraged to set specific goals in number of cigarettes used daily, as well as to set a quit date.  Number of cigarettes/cigars currently smoking daily:5       Chest pain   Chest tightness   Weakness of left upper extremity   LUE weakness   Hyperkalemia   Conjunctivitis of left eye   Last Assessment & Plan 03/02/2015 Office Visit Written 03/02/2015  9:09 AM by [name], MD    1 day h/o left eye redness and drainage      Anal sphincter  incontinence   Left-sided low back pain with left-sided sciatica   Last Assessment & Plan 05/19/2015 Office Visit Written 06/27/2015 11:59 PM by [name], MD    hUncontrolled.Toradol and depo medrol administered IM in the office , Return to pain management, referral entered, short course of hydrocodone prescribed and she is not to take any other pain med wit  this      Left flank pain   Last Assessment & Plan 05/19/2015 Office Visit Written 06/28/2015 12:01 AM by [name], MD    Left flank pain with hypertension, will order renal US, also stool tested for hidden blood and is negative In house UA negative for blood, likely referred pain  from back, will f/u  Renal US      Vertigo   Last Assessment & Plan 07/15/2015 Office Visit Written 08/09/2015  1:31 PM by [name], MD    Currently symptomatic, o nystagmus, antivert prescribed for short term, as needed, use      Normal coronary arteries   Last Assessment & Plan 08/03/2015 Office Visit Written 08/03/2015 12:10 PM by Erlene Quan, PA-C    2007 after an abnormal Cardiolite      History of palpitations   Last Assessment & Plan 07/15/2015 Office Visit Written 08/09/2015  1:27 PM by [name], MD    Office EKG done at visit on 07/15/2015 showed NSR, no ischemia      Nausea without vomiting   Last Assessment & Plan 07/15/2015 Office Visit Written 08/09/2015  1:34 PM by [name], MD    zofran administered in office as currently symptomatic          Imaging: No results found.

## 2015-08-17 NOTE — Progress Notes (Signed)
Cardiology Office Note   Date:  08/17/2015   ID:  [name], DOB May 09, 1950, MRN [id number]  PCP:  Tula Nakayama, MD  Cardiologist: To be established/  Jory Sims, NP   Chief Complaint  Patient presents with  . Hypertension      History of Present Illness: [name] is a 65 y.o. female who presents for ongoing assessment and management of hypertension, with history of abnormal Cardiolite followed by a cardiac catheterization 2007 revealing normal coronary anatomy. The patient also has had a echocardiogram in 2016 revealing normal LV function, mild LVH, moderate pulmonary hypertension. She is also followed by pulmonology for COPD and obstructive sleep apnea. She was last seen in the office on 08/03/2015 post-ospitalization for chest pain and dyspnea. She needs to be established with a cardiologist here in [place] as per last office note.  Of note the patient has multiple medical problems and psychiatric issues. There was concern about compliance. On last office visit the patient was seen by Kerin Ransom, PA, and low-dose ARB was added. Followup labs on 07/19/2015 demonstrated sodium of 141, potassium 4.0, chloride 105, CO2 28, BUN 23, creatinine 0.90.   She is here with multiple complaints. She was to quit smoking, she has headaches, she has occasional chest pressure. She is concerned that she may have a heart attack soon.she is tolerating the medication Cozaar 25 mg daily without complaint.   Past Medical History  Diagnosis Date  . Pancreatitis 2006    due to Depakote with normal EUS   . Osteoporosis   . Chronic back pain   . Migraines     chronic headaches  . Diverticulosis     TCS 9/08 by Dr. Delfin Edis for diarrhea . Bx for micro scopic colitis negative.   . Schatzki's ring     non critical / EGD with ED 8/2011with RMR  . Glaucoma     eye drops daily  . Anemia   . Blood transfusion   . Stroke [facility])     left sided weakness  . Metabolic encephalopathy  9/73/5329  . Sleep apnea     on CPAP  . Arthritis   . Gum symptoms     infection on antibiotic  . Chronic neck pain   . Mononeuritis lower limb   . Frequent falls   . Diabetes mellitus     Type II  . Cervical disc disorder with radiculopathy of cervical region 10/31/2012  . COPD (chronic obstructive pulmonary disease) with chronic bronchitis ([place]) 09/16/2013    Office Spirometry 10/30/2013-submaximal effort based on appearance of loop and curve. Numbers would fit with severe restriction but her physiologic capability may be better than this. FVC 0.91/44%, and 10.74/45%, FEV1/FVC 0.81, FEF 25-75% 1.43/69%    . Carpal tunnel syndrome of right wrist 05/23/2011  . Hemiplegia affecting non-dominant side, post-stroke ([place]) 08/02/2011  . Hypertension     takes Amlodipine,Metoprolol,and Clonidine daily  . Hyperlipidemia     takes Crestor daily  . Insomnia     takes Trazodone nightly  . Depression     takes Zoloft daily  . Bipolar disorder ([place])     takes Risperdal nightly  . GERD (gastroesophageal reflux disease)     takes Aciphex daily  . Anxiety     takes Ativan daily  . Hypothyroidism     takes Synthroid daily  . Seizures ([place])     takes Lamictal daily.Last seizure 3 yrs ago    Past Surgical History  Procedure Laterality  Date  . Abdominal hysterectomy  1978  . Cholecystectomy  1984  . Ovarian cyst removal    . Carpal tunnel release Left 07/22/04    Dr. Aline Brochure  . Breast reduction surgery  1994  . Cataract extraction Bilateral   . Biopsy thyroid  2009  . Surgical excision of 3 tumors from right thigh and right buttock  and left upper thigh  2010  . Back surgery  July 2012  . Spine surgery  09/29/2010    Dr. Rolena Infante  . Maloney dilation  12/29/2010    RMR;  . Esophagogastroduodenoscopy  12/29/2010    Rourk-Retained food in the esophagus and stomach, small hiatal hernia, status post Maloney dilation of the esophagus  . Craniotomy  11/23/2011    Procedure: CRANIOTOMY TUMOR  EXCISION;  Surgeon: Hosie Spangle, MD;  Location: [facility];  Service: Neurosurgery;  Laterality: N/A;  Craniotomy for tumor resection  . Brain surgery  11/2011    resection of meningioma  . Colonoscopy N/A 09/25/2012    EQA:STMHDQQ diverticulosis.  colonic polyp-removed : tubular adenoma  . Esophagogastroduodenoscopy N/A 09/25/2012    IWL:NLGXQJJH atonic baggy esophagus status post Maloney dilation 2 F. Hiatal hernia  . Givens capsule study N/A 01/15/2013    NORMAL.   . Bacterial overgrowth test N/A 05/05/2013    Procedure: BACTERIAL OVERGROWTH TEST;  Surgeon: Daneil Dolin, MD;  Location: [facility];  Service: Endoscopy;  Laterality: N/A;  7:30  . Transthoracic echocardiogram  2010    EF 60-65%, mild conc LVH, grade 1 diastolic dysfunction; mildly calcified MV annulus with mildly thickened leaflets, mildly calcified MR annulus  . Nm myocar perf wall motion  2006    "relavtiely normal" persantine, mild anterior thinning (breast attenuation artifact), no region of scar/ischemia  . Cardiac catheterization  05/10/2005    normal coronaries, normal LV systolic function and EF (Dr. Jackie Plum)  . Tooth extraction Bilateral 12/14/2014    Procedure: REMOVAL OF BILATERAL MANDIBULAR EXOSTOSES;  Surgeon: Diona Browner, DDS;  Location: [facility];  Service: Oral Surgery;  Laterality: Bilateral;  . Lesion removal N/A 05/31/2015    Procedure: REMOVAL RIGHT AND LEFT LESIONS OF MANDIBLE;  Surgeon: Diona Browner, DDS;  Location: [facility];  Service: Oral Surgery;  Laterality: N/A;  . Rectocele repair N/A 06/29/2015    Procedure: POSTERIOR REPAIR (RECTOCELE);  Surgeon: Jonnie Kind, MD;  Location: [facility];  Service: Gynecology;  Laterality: N/A;     Current Outpatient Prescriptions  Medication Sig Dispense Refill  . ACCU-CHEK FASTCLIX LANCETS MISC Use to test blood sugar 4 times daily. Dx: E10.65 408 each 1  . albuterol (PROVENTIL HFA;VENTOLIN HFA) 108 (90 BASE) MCG/ACT inhaler Inhale 1 puff into the lungs 3  (three) times daily. 1 Inhaler 2  . Alcohol Swabs (B-D SINGLE USE SWABS REGULAR) PADS Use for injections and glucose testing 8 times daily. Dx: E10.65 900 each 1  . amLODipine (NORVASC) 5 MG tablet Take 2 tablets (10 mg total) by mouth daily. 60 tablet 3  . aspirin EC 81 MG tablet Take 81 mg by mouth daily.    . Azelastine-Fluticasone (DYMISTA) 137-50 MCG/ACT SUSP Place 1 puff into the nose at bedtime. 1 Bottle 0  . BD PEN NEEDLE NANO U/F 32G X 4 MM MISC USE FOUR TIMES DAILY 360 each 3  . Blood Glucose Calibration (ACCU-CHEK SMARTVIEW CONTROL) LIQD 1 each by Other route as needed. 1 each 2  . Blood Glucose Monitoring Suppl (ACCU-CHEK NANO SMARTVIEW) W/DEVICE KIT  1 each by Does not apply route daily. Dx: E10.65 1 kit 0  . Cholecalciferol (D 5000) 5000 UNITS capsule Take 5,000 Units by mouth daily.    . clobetasol cream (TEMOVATE) 5.80 % Apply 1 application topically daily.    . cloNIDine (CATAPRES) 0.3 MG tablet TAKE 1 TABLET BY MOUTH EVERY EIGHT HOURS. ONCE AT 8AM, 4PM, AND 12 MIDNIGHT. 90 tablet 3  . conjugated estrogens (PREMARIN) vaginal cream Place 1 Applicatorful vaginally 2 (two) times a week. 30 g 1  . diclofenac sodium (VOLTAREN) 1 % GEL Apply 2 g topically daily as needed (Pain). 100 g 2  . dicyclomine (BENTYL) 10 MG capsule TAKE 1 CAPSULE BY MOUTH THREE TIMES DAILY BEFORE MEALS. 90 capsule 2  . glucose blood (ACCU-CHEK SMARTVIEW) test strip Use to test blood sugar 4 times daily as instructed. Dx: E10.65 375 each 1  . HYDROcodone-acetaminophen (NORCO) 5-325 MG tablet Take 1 tablet by mouth every 6 (six) hours as needed for moderate pain. 30 tablet 0  . hydroxychloroquine (PLAQUENIL) 200 MG tablet Take 200 mg by mouth daily.    . insulin aspart (NOVOLOG FLEXPEN) 100 UNIT/ML FlexPen Inject 14-18 Units into the skin 3 (three) times daily with meals. 30 mL 1  . Insulin Glargine (TOUJEO SOLOSTAR) 300 UNIT/ML SOPN Inject 25 Units into the skin at bedtime. 6 pen 1  . lamoTRIgine (LAMICTAL) 100  MG tablet TAKE 1 TABLET BY MOUTH TWICE DAILY. 60 tablet 5  . levothyroxine (SYNTHROID, LEVOTHROID) 50 MCG tablet TAKE 1 TABLET BY MOUTH ONCE DAILY AND 1/2 TABLET ON SUNDAYS. 30 tablet 2  . LORazepam (ATIVAN) 0.5 MG tablet Take 1 tablet (0.5 mg total) by mouth 3 (three) times daily. 90 tablet 2  . losartan (COZAAR) 25 MG tablet Take 1 tablet (25 mg total) by mouth daily. 30 tablet 5  . meclizine (ANTIVERT) 12.5 MG tablet Take 1 tablet (12.5 mg total) by mouth 3 (three) times daily as needed for dizziness. 15 tablet 0  . methocarbamol (ROBAXIN) 500 MG tablet Take 500 mg by mouth daily.    . metoprolol (LOPRESSOR) 50 MG tablet TAKE 1 TABLET BY MOUTH TWICE DAILY. 180 tablet 0  . montelukast (SINGULAIR) 10 MG tablet TAKE ONE TABLET BY MOUTH ONCE DAILY. 30 tablet 3  . Multiple Vitamins-Minerals (ONE-A-DAY 50 PLUS PO) Take 1 tablet by mouth daily.    [name] Nystatin (NYAMYC) 100000 UNIT/GM POWD APPLY TO AFFECTED AREA 4 TIMES DAILY. 30 g 2  . polyethylene glycol powder (MIRALAX) powder Take 17 g by mouth daily. To prevent constipation 255 g prn  . pregabalin (LYRICA) 75 MG capsule Take 75 mg by mouth 2 (two) times daily.     . RABEprazole (ACIPHEX) 20 MG tablet TAKE 1 TABLET BY MOUTH TWICE DAILY. 60 tablet 5  . RESTASIS 0.05 % ophthalmic emulsion Place 1 drop into both eyes 2 (two) times daily.     . risperiDONE (RISPERDAL) 0.5 MG tablet Take 1 tablet (0.5 mg total) by mouth at bedtime. 30 tablet 3  . rosuvastatin (CRESTOR) 5 MG tablet TAKE 1 TABLET BY MOUTH AT BEDTIME. 30 tablet 3  . sertraline (ZOLOFT) 100 MG tablet Take 2 tablets (200 mg total) by mouth daily. 60 tablet 3  . traZODone (DESYREL) 150 MG tablet Take 2 tablets (300 mg total) by mouth at bedtime. 60 tablet 3  . vitamin E 400 UNIT capsule Take 400 Units by mouth daily.     No current facility-administered medications for this visit.  Allergies:   Cephalexin; Iron; Milk-related compounds; Penicillins; and Phenazopyridine hcl    Social  History:  The patient  reports that she has been smoking Cigarettes.  She has a 3.5 pack-year smoking history. She has never used smokeless tobacco. She reports that she does not drink alcohol or use illicit drugs.   Family History:  The patient's family history includes Alcohol abuse in her maternal uncle; Cancer in her sister; Diabetes in her brother, brother, and father; Heart attack in her maternal grandfather and mother; Hypertension in her brother, brother, and son; Kidney failure in her father; Pancreatic cancer in her sister; Pneumonia in her father; Sleep apnea in her son; Stroke in her maternal grandmother. There is no history of Colon cancer, Anesthesia problems, Hypotension, Malignant hyperthermia, or Pseudochol deficiency.    ROS: All other systems are reviewed and negative. Unless otherwise mentioned in H&P    PHYSICAL EXAM: VS:  BP 124/60 mmHg  Pulse 63  Ht 5' 1"  (1.549 m)  Wt 160 lb (72.576 kg)  BMI 30.25 kg/m2  SpO2 97%  LMP  (Exact Date) , BMI Body mass index is 30.25 kg/(m^2). GEN: Well nourished, well developed, in no acute distress HEENT: normal Neck: no JVD, carotid bruits, or masses Cardiac: RRR; no murmurs, rubs, or gallops,no edema  Respiratory:  clear to auscultation bilaterally, normal work of breathing GI: soft, nontender, nondistended, + BS MS: no deformity or atrophy Skin: warm and dry, no rash Neuro:  Strength and sensation are intact Psych: euthymic mood, full affect   Recent Labs: 02/21/2015: Magnesium 2.0; TSH 1.207 06/30/2015: Hemoglobin 9.6*; Platelets 187 07/19/2015: ALT 12; BUN 23; Creat 0.90; Potassium 4.0; Sodium 141    Lipid Panel    Component Value Date/Time   CHOL 136 07/19/2015 0925   TRIG 131 07/19/2015 0925   HDL 44* 07/19/2015 0925   CHOLHDL 3.1 07/19/2015 0925   VLDL 26 07/19/2015 0925   LDLCALC 66 07/19/2015 0925      Wt Readings from Last 3 Encounters:  08/17/15 160 lb (72.576 kg)  08/11/15 154 lb (69.854 kg)  08/04/15  158 lb (71.668 kg)     ASSESSMENT AND PLAN:  1. Hypertension: Blood pressure is very well controlled. She is tolerating medications without dizziness or weakness. We'll continue current regimen.  2. Recurrent chest pain: she had a normal catheterization in 2007 but with continued smoking and recurrent chest pain she is concerned about possibility of coronary artery disease. She wishes to have a stress test. My plan will be to schedule her for LexiScan Cardiolite. She does not wish to have this until August of 2017. This will be planned.  3. Ongoing tobacco abuse:she is up to a pack or more a day. She had quit in the past using Chantix. She is due to see her primary care physician next week. She will discuss with [name] treatment regimen. She is encouraged to followup with this as this is a significant cardiovascular risk factor.   Current medicines are reviewed at length with the patient today.    Labs/ tests ordered today include:  No orders of the defined types were placed in this encounter.     Disposition:   FU with 3 months after stress test.  Signed, Jory Sims, NP  08/17/2015 2:07 PM    [facility] [address], [place], [place] [address] Phone: [phone]; Fax: [phone]

## 2015-08-17 NOTE — Patient Instructions (Signed)
Your physician recommends that you schedule a follow-up appointment after your stress test.   Your physician recommends that you continue on your current medications as directed. Please refer to the Current Medication list given to you today.  Your physician has requested that you have a lexiscan myoview. For further information please visit [email or web address]. Please follow instruction sheet, as given.  If you need a refill on your cardiac medications before your next appointment, please call your pharmacy.  Thank you for choosing [facility]!

## 2015-08-17 NOTE — Telephone Encounter (Signed)
Pt called stating when she called yesterday stating she was out of her Trazodone, it was the wrong medication she requested. Per pt, she is out of her Lorazepam. Per pt chart, medication was printed and given to her on 06-22-2015 with 1 refill. Pt was instructed to return to office in Aug 2017. Per pt, she is out of refills. Pt number [phone].

## 2015-08-17 NOTE — Telephone Encounter (Signed)
Called pt and informed her that her printed script is ready for pick up. Per pt she will try her hardest to pick up printed script on 08-18-2015.

## 2015-08-18 ENCOUNTER — Encounter (HOSPITAL_COMMUNITY): Payer: Self-pay | Admitting: *Deleted

## 2015-08-18 DIAGNOSIS — G4733 Obstructive sleep apnea (adult) (pediatric): Secondary | ICD-10-CM | POA: Diagnosis not present

## 2015-08-18 NOTE — Progress Notes (Signed)
Pt came into office to pick up her printed script for Lorazepam 0.5 mg due to previous phone call. Pt D/L number is [id number] with expiration date of 06-24-2018. Pt agreed with printed script.

## 2015-08-19 ENCOUNTER — Ambulatory Visit (HOSPITAL_COMMUNITY): Payer: Self-pay | Admitting: Psychiatry

## 2015-08-22 IMAGING — MR MR HIP*R* W/O CM
5 series · 35 of 40 positions shown · non-contrast
Comparison: Right hip radiographs 12/19/2013. Pelvic CT 07/16/2012.

CLINICAL DATA: Right hip pain extending into the knee for several
months. No acute injury. Initial encounter.

EXAM:
MR OF THE RIGHT HIP WITHOUT CONTRAST
TECHNIQUE: Multiplanar, multisequence MR imaging was performed. No intravenous
contrast was administered.

[Series 3: t2fs_tse_tra · axial · 4.0mm · 1.03mm/px · z∈[-102,+18]mm · 9 of 26 slices shown]
[im 1/26]
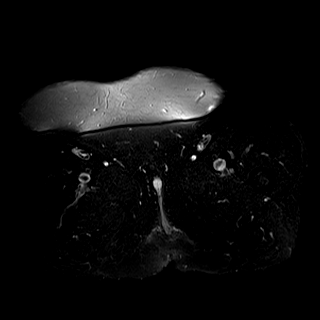
[im 4/26]
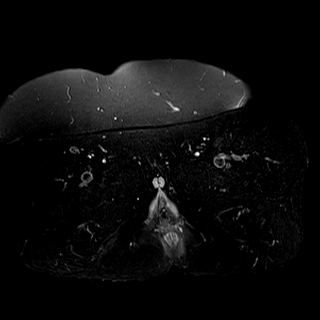
[im 7/26]
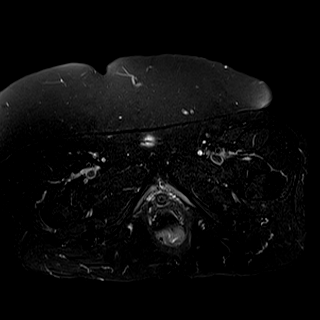
[im 10/26]
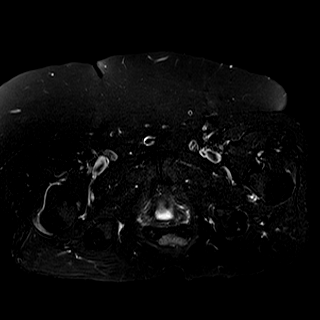
[im 13/26]
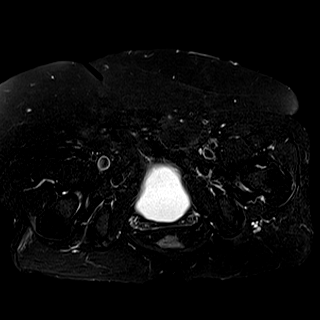
[im 16/26]
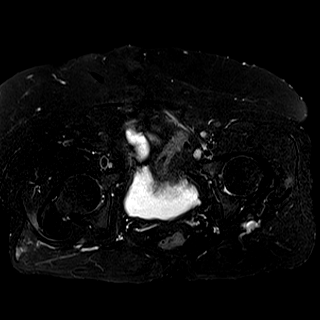
[im 19/26]
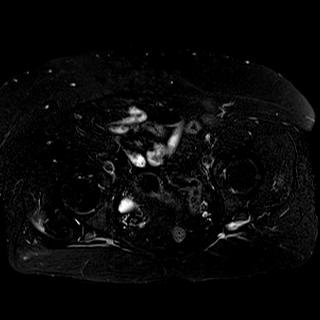
[im 22/26]
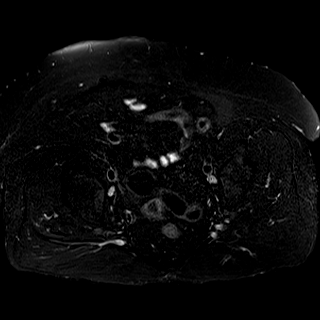
[im 26/26]
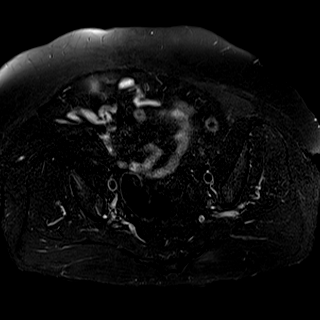

[Series 4: t1_tse_tra · axial · 4.0mm · 1.03mm/px · z∈[-102,+18]mm · 8 of 26 slices shown]
[im 1/26]
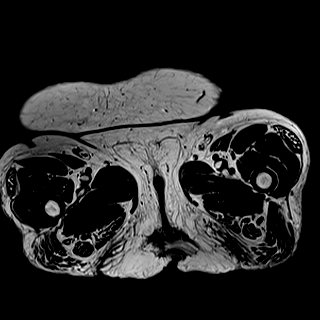
[im 4/26]
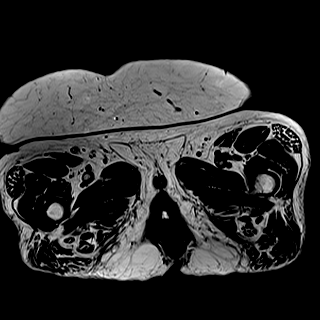
[im 8/26]
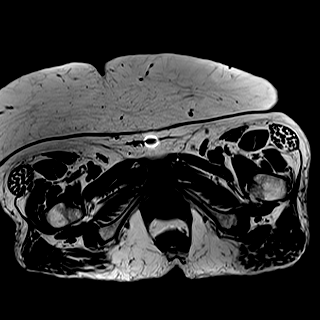
[im 11/26]
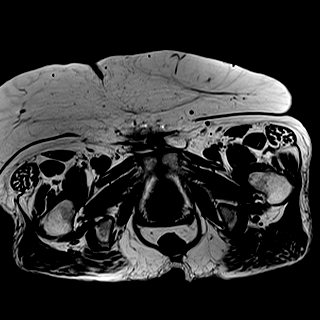
[im 15/26]
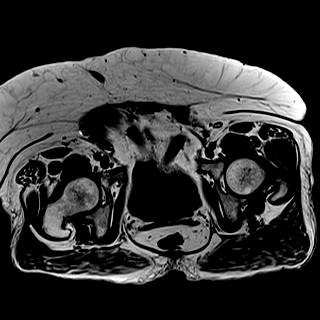
[im 18/26]
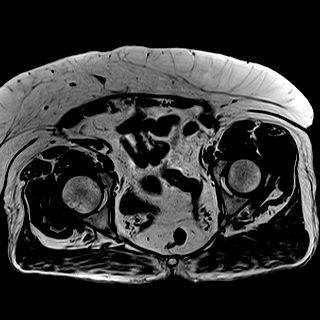
[im 22/26]
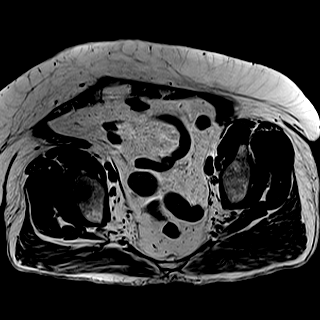
[im 26/26]
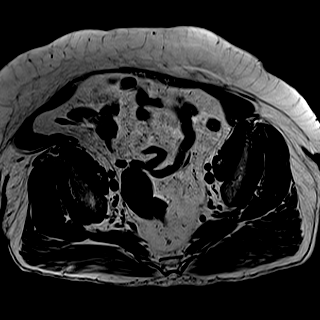

[Series 5: t1_tse_cor · coronal · 4.0mm · 0.67mm/px · 8 of 26 slices shown]
[im 1/26]
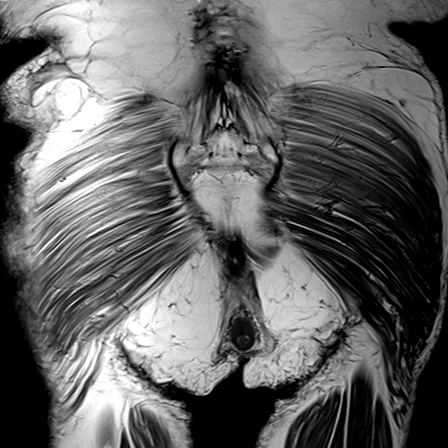
[im 4/26]
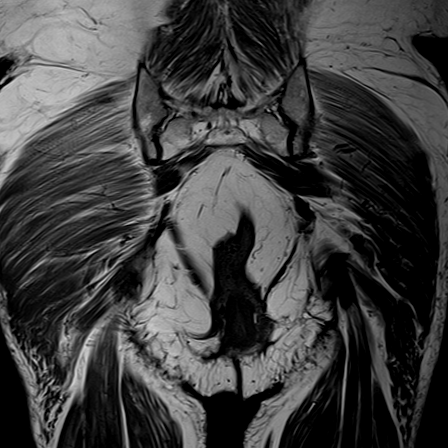
[im 8/26]
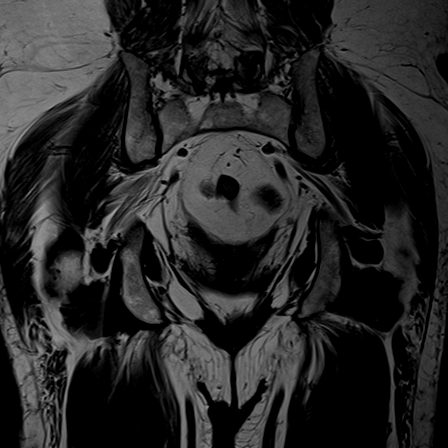
[im 11/26]
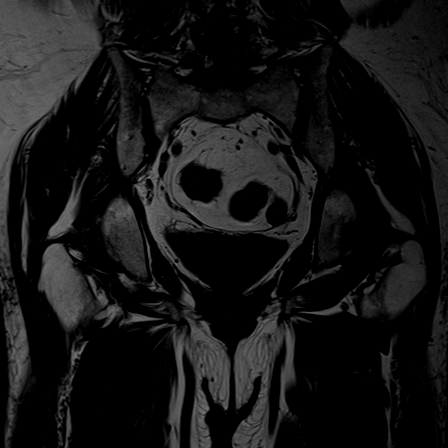
[im 15/26]
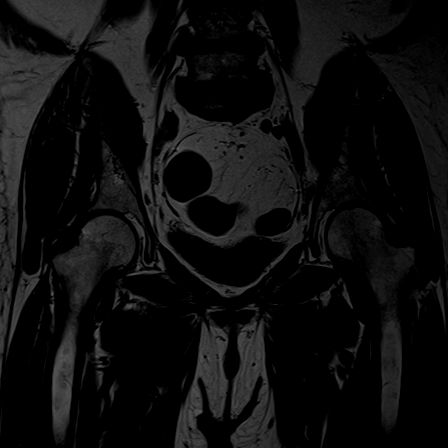
[im 18/26]
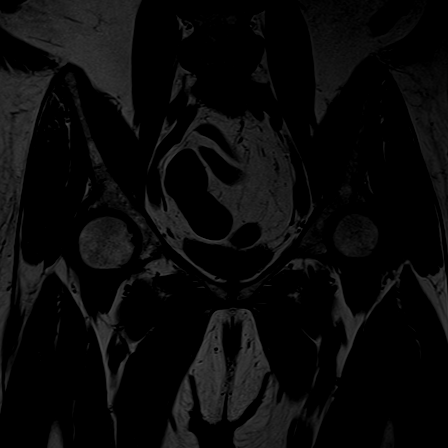
[im 22/26]
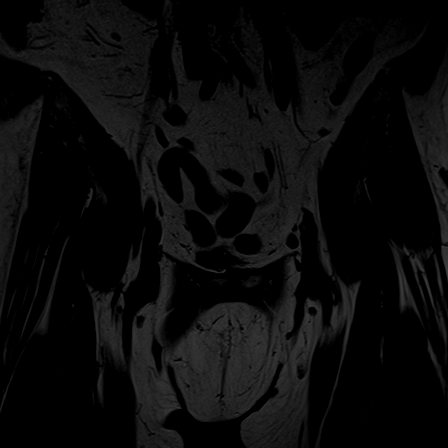
[im 26/26]
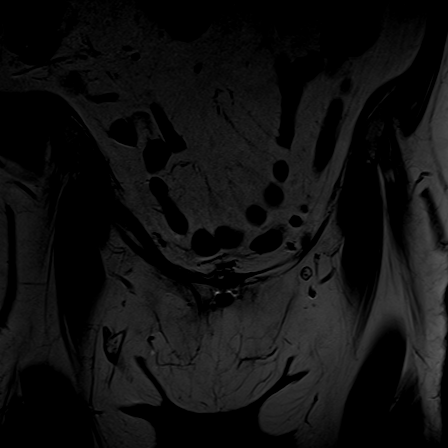

[Series 7: t1_ir_cor · coronal · 4.0mm · 0.95mm/px · 8 of 26 slices shown]
[im 1/26]
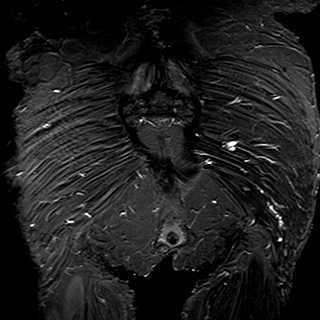
[im 4/26]
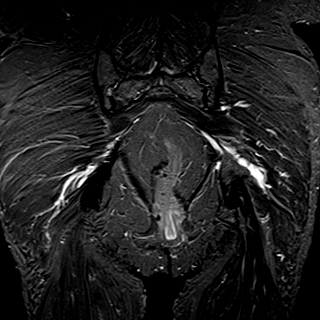
[im 8/26]
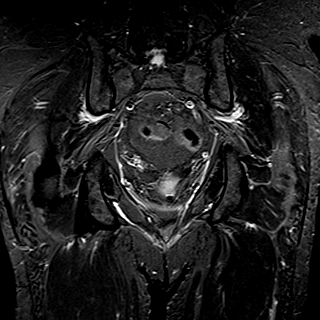
[im 11/26]
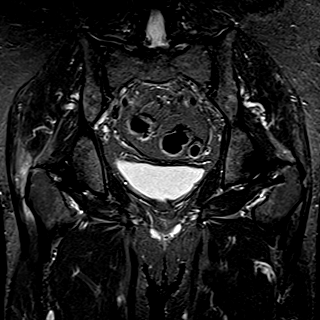
[im 15/26]
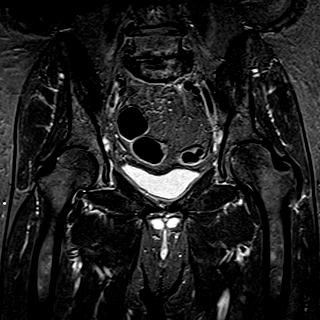
[im 18/26]
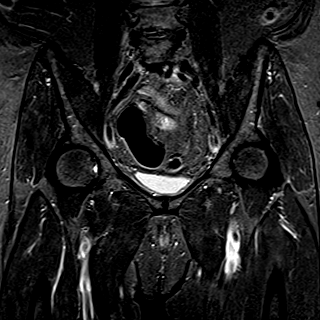
[im 22/26]
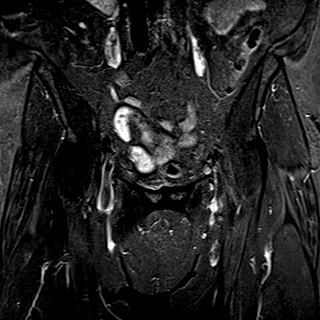
[im 26/26]
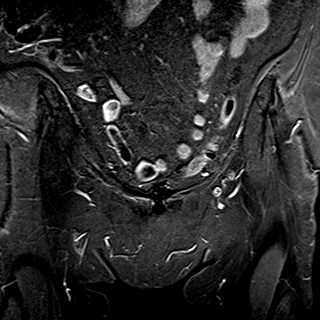

[Series 8: pd_tse_fs_sag · sagittal · 4.0mm · 0.29mm/px · 2 of 22 slices shown]
[im 1/22]
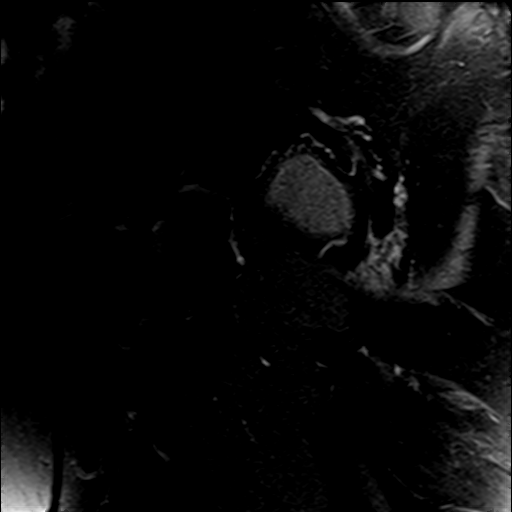
[im 4/22]
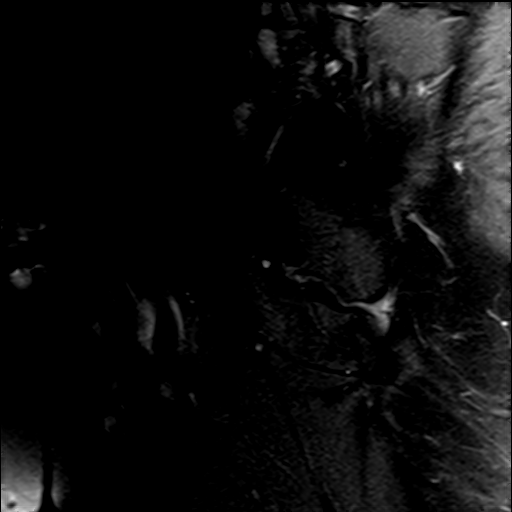

[35 of 40 positions shown; findings below may reference images not displayed]

FINDINGS: Bones: Both femoral heads appear normal without evidence of acute
fracture, dislocation or avascular necrosis. The visualized bony
pelvis appears normal. There are mild degenerative changes of the
sacroiliac joints. The symphysis pubis appears normal. There is
lower lumbar spondylosis which appears grossly stable compared with
prior CT.

Articular cartilage and labrum

Articular cartilage: There are mild symmetric degenerative changes
of both hips. No focal chondral defect or subchondral signal
abnormality demonstrated.

Labrum: Bilateral labral degeneration without gross tear or
paralabral abnormality.

Joint or bursal effusion

Joint effusion: No significant hip joint effusion.

Bursae: No focal periarticular fluid collection.

Muscles and tendons

Muscles and tendons: There is mild gluteus medius tendinosis, worse
on the right where there is adjacent ill-defined fluid. No tendon
tear is demonstrated. The iliopsoas and hamstring tendons appear
normal. The piriformis muscles are symmetric.

Other findings

Miscellaneous: Status post hysterectomy. A degree of pelvic floor
laxity likely. There is postsurgical susceptibility effect adjacent
to the symphysis pubis.
IMPRESSION: 1. Mild gluteus medius tendinosis, worse on the right. No tendon
tear or significant bursal fluid collection.
2. Fairly symmetric degenerative changes of the hips, sacroiliac
joints and lower lumbar spine. No acute osseous findings evident.

## 2015-08-23 DIAGNOSIS — J440 Chronic obstructive pulmonary disease with acute lower respiratory infection: Secondary | ICD-10-CM | POA: Diagnosis not present

## 2015-08-26 ENCOUNTER — Ambulatory Visit (INDEPENDENT_AMBULATORY_CARE_PROVIDER_SITE_OTHER): Payer: Commercial Managed Care - HMO | Admitting: Otolaryngology

## 2015-08-26 DIAGNOSIS — R040 Epistaxis: Secondary | ICD-10-CM

## 2015-08-30 ENCOUNTER — Encounter: Payer: Self-pay | Admitting: *Deleted

## 2015-08-30 ENCOUNTER — Other Ambulatory Visit: Payer: Self-pay

## 2015-08-31 ENCOUNTER — Encounter: Payer: Self-pay | Admitting: Family Medicine

## 2015-08-31 ENCOUNTER — Ambulatory Visit (INDEPENDENT_AMBULATORY_CARE_PROVIDER_SITE_OTHER): Payer: Commercial Managed Care - HMO | Admitting: Family Medicine

## 2015-08-31 VITALS — BP 120/60 | HR 73 | Resp 16 | Ht 61.0 in | Wt 159.0 lb

## 2015-08-31 DIAGNOSIS — Z23 Encounter for immunization: Secondary | ICD-10-CM

## 2015-08-31 DIAGNOSIS — Z0000 Encounter for general adult medical examination without abnormal findings: Secondary | ICD-10-CM | POA: Diagnosis not present

## 2015-08-31 DIAGNOSIS — F17210 Nicotine dependence, cigarettes, uncomplicated: Secondary | ICD-10-CM

## 2015-08-31 DIAGNOSIS — Z720 Tobacco use: Secondary | ICD-10-CM

## 2015-08-31 NOTE — Assessment & Plan Note (Signed)
Annual exam as documented. Counseling done  re healthy lifestyle involving commitment to 150 minutes exercise per week, heart healthy diet, and attaining healthy weight.The importance of adequate sleep also discussed. Regular seat belt use and home safety, is also discussed. Changes in health habits are decided on by the patient with goals and time frames  set for achieving them. Immunization and cancer screening needs are specifically addressed at this visit.  

## 2015-08-31 NOTE — Patient Instructions (Signed)
F/u in September, call if you need me sooner  Pneumonia 23 today  Work on what we discussed as far as stopping smoking.  No MORE cigarettes starting tomorrow, do nit but anymore, use the nicotrol inhaler instead for the next 6 weeks total  Get help from your [name], you CAN do this!  Care with falls in the home, de clutter the house and use good lighting  Thank you  for choosing [facility]. We consider it a privelige to serve you.  Delivering excellent health care in a caring and  compassionate way is our goal.  Partnering with you,  so that together we can achieve this goal is our strategy.

## 2015-08-31 NOTE — Progress Notes (Signed)
Subjective:    Patient ID: [name], female    DOB: January 17, 1951, 65 y.o.   MRN: [id number]  HPI Preventive Screening-Counseling & Management   Patient present here today for a Medicare annual wellness visit.   Current Problems (verified)   Medications Prior to Visit Allergies (verified)   PAST HISTORY  Family History (verified)  Social History Divorced, 1 son, lives alone, current smoker    Risk Factors  Current exercise habits:  Tries to walk daily as able   Dietary issues discussed: heart healthy, limit fried foods and carbs    Cardiac risk factors: DM type 2. Mother had MI   Depression Screen  (Note: if answer to either of the following is "Yes", a more complete depression screening is indicated)   Over the past two weeks, have you felt down, depressed or hopeless? Yes- sees Dr Harrington Challenger (psych)  Over the past two weeks, have you felt little interest or pleasure in doing things? yes Have you lost interest or pleasure in daily life? No  Do you often feel hopeless? No  Do you cry easily over simple problems? yes  Activities of Daily Living  In your present state of health, do you have any difficulty performing the following activities?  Driving?: doesn't drive  Managing money?: Niece manages money  Feeding yourself?:No Getting from bed to chair?:uses a cane  Climbing a flight of stairs?: doesn't climb stairs  Preparing food and eating?:No Bathing or showering?:sometimes aid helps her  Getting dressed?:No Getting to the toilet?:No Using the toilet?:No Moving around from place to place?: uses cane for stability   Fall Risk Assessment In the past year have you fallen or had a near fall?:No Are you currently taking any medications that make you dizzy?:No   Hearing Difficulties: No Do you often ask people to speak up or repeat themselves?:No Do you experience ringing or noises in your ears?:No Do you have difficulty understanding soft or whispered  voices?:No  Cognitive Testing  Alert? Yes Normal Appearance?Yes  Oriented to person? Yes Place? Yes  Time? Yes  Displays appropriate judgment?Yes  Can read the correct time from a watch face? yes Are you having problems remembering things? Yes, sometimes   Advanced Directives have been discussed with the patient?Yes, working on her will  , full code   List the Names of Other Physician/Practitioners you currently use:  (updated)    Indicate any recent Medical Services you may have received from other than [facility] providers in the past year (date may be approximate).   Assessment:    Annual Wellness Exam   Plan:    .  Medicare Attestation  I have personally reviewed:  The patient's medical and social history  Their use of alcohol, tobacco or illicit drugs  Their current medications and supplements  The patient's functional ability including ADLs,fall risks, home safety risks, cognitive, and hearing and visual impairment  Diet and physical activities  Evidence for depression or mood disorders  The patient's weight, height, BMI, and visual acuity have been recorded in the chart. I have made referrals, counseling, and provided education to the patient based on review of the above and I have provided the patient with a written personalized care plan for preventive services.      Review of Systems     Objective:   Physical Exam  BP 120/60 mmHg  Pulse 73  Resp 16  Ht 5\' 1"  (1.549 m)  Wt 159 lb (72.122 kg)  BMI 30.06 kg/m2  SpO2 95%  LMP  (Exact Date)       Assessment & Plan:   Medicare annual wellness visit, subsequent Annual exam as documented. Counseling done  re healthy lifestyle involving commitment to 150 minutes exercise per week, heart healthy diet, and attaining healthy weight.The importance of adequate sleep also discussed. Regular seat belt use and home safety, is also discussed. Changes in health habits are decided on by the patient with goals and time  frames  set for achieving them. Immunization and cancer screening needs are specifically addressed at this visit.   Light cigarette smoker (1-9 cigarettes per day) Patient counseled for approximately 5 minutes regarding the health risks of ongoing nicotine use, specifically all types of cancer, heart disease, stroke and respiratory failure. The options available for help with cessation ,the behavioral changes to assist the process, and the option to either gradully reduce usage  Or abruptly stop.is also discussed. Pt is also encouraged to set specific goals in number of cigarettes used daily, as well as to set a quit date.  Number of cigarettes/cigars currently smoking daily: 9

## 2015-09-01 ENCOUNTER — Other Ambulatory Visit: Payer: Self-pay

## 2015-09-01 ENCOUNTER — Ambulatory Visit (INDEPENDENT_AMBULATORY_CARE_PROVIDER_SITE_OTHER): Payer: Commercial Managed Care - HMO | Admitting: Obstetrics and Gynecology

## 2015-09-01 ENCOUNTER — Encounter: Payer: Self-pay | Admitting: Obstetrics and Gynecology

## 2015-09-01 VITALS — BP 150/72 | Ht 60.0 in | Wt 160.0 lb

## 2015-09-01 DIAGNOSIS — Z09 Encounter for follow-up examination after completed treatment for conditions other than malignant neoplasm: Secondary | ICD-10-CM

## 2015-09-01 DIAGNOSIS — N816 Rectocele: Secondary | ICD-10-CM

## 2015-09-01 NOTE — Progress Notes (Signed)
Patient ID: [name], female   DOB: 08-03-1950, 65 y.o.   MRN: [id number]   Subjective:  [name] is a 65 y.o. female now 10 weeks status post posterior colporrhaphy.  Patient complains of continuing mild sore rectal pain preceding a BM. She states she has a BM every other day. Patient states she has been using a stool softener as instructed by her GI doctor, Dr. Gala Romney.   Patient states she has been continuing to use clobetasol for vulvar irritation. Advised to reduce from bid to 2x/wk maximum, and to d/c if sx resolved  Review of Systems Negative except as noted above   Diet:   normal   Bowel movements : occurring every other day, associated with sore rectal pain..     Objective:  BP 150/72 mmHg  Ht 5' (1.524 m)  Wt 160 lb (72.576 kg)  BMI 31.25 kg/m2  LMP  (Exact Date) General:Well developed, well nourished.  No acute distress. Abdomen: Bowel sounds normal, soft, non-tender. Pelvic Exam:    External Genitalia:  Normal, except perineal tissues appear older than stated age. No vulvar pruritic areas    Vagina: Adequate posterior support; Generalized descent of vulva.  Assessment:  Post-Op 10 weeks s/p posterior colporrhaphy   Doing well postoperatively.   Plan:  1.Wound care discussed  n/a 2. D/c clabetasol. 3. Activity restrictions: none 4. return to work: not applicable. 5. Follow up prn or in 1 year   By signing my name below, I, [name], attest that this documentation has been prepared under the direction and in the presence of Jonnie Kind, MD. Electronically Signed: Stephania [name], ED Scribe. 09/01/2015. 10:12 AM.  I personally performed the services described in this documentation, which was SCRIBED in my presence. The recorded information has been reviewed and considered accurate. It has been edited as necessary during review. Jonnie Kind, MD

## 2015-09-03 ENCOUNTER — Encounter: Payer: Self-pay | Admitting: Family Medicine

## 2015-09-03 NOTE — Assessment & Plan Note (Signed)
Patient counseled for approximately 5 minutes regarding the health risks of ongoing nicotine use, specifically all types of cancer, heart disease, stroke and respiratory failure. The options available for help with cessation ,the behavioral changes to assist the process, and the option to either gradully reduce usage  Or abruptly stop.is also discussed. Pt is also encouraged to set specific goals in number of cigarettes used daily, as well as to set a quit date.  Number of cigarettes/cigars currently smoking daily: 9

## 2015-09-06 DIAGNOSIS — J440 Chronic obstructive pulmonary disease with acute lower respiratory infection: Secondary | ICD-10-CM | POA: Diagnosis not present

## 2015-09-06 DIAGNOSIS — M79609 Pain in unspecified limb: Secondary | ICD-10-CM | POA: Diagnosis not present

## 2015-09-06 DIAGNOSIS — I10 Essential (primary) hypertension: Secondary | ICD-10-CM | POA: Diagnosis not present

## 2015-09-07 ENCOUNTER — Ambulatory Visit
Admission: RE | Admit: 2015-09-07 | Discharge: 2015-09-07 | Disposition: A | Discharge status: Home / Self Care | Payer: Commercial Managed Care - HMO | Source: Ambulatory Visit

## 2015-09-07 DIAGNOSIS — Z1231 Encounter for screening mammogram for malignant neoplasm of breast: Secondary | ICD-10-CM

## 2015-09-08 ENCOUNTER — Ambulatory Visit (INDEPENDENT_AMBULATORY_CARE_PROVIDER_SITE_OTHER): Payer: Commercial Managed Care - HMO | Admitting: Neurology

## 2015-09-08 ENCOUNTER — Encounter: Payer: Self-pay | Admitting: Neurology

## 2015-09-08 VITALS — BP 134/56 | HR 65 | Ht 60.0 in | Wt 161.0 lb

## 2015-09-08 DIAGNOSIS — Z8669 Personal history of other diseases of the nervous system and sense organs: Secondary | ICD-10-CM

## 2015-09-08 DIAGNOSIS — I69359 Hemiplegia and hemiparesis following cerebral infarction affecting unspecified side: Secondary | ICD-10-CM | POA: Diagnosis not present

## 2015-09-08 DIAGNOSIS — Z720 Tobacco use: Secondary | ICD-10-CM

## 2015-09-08 DIAGNOSIS — G43009 Migraine without aura, not intractable, without status migrainosus: Secondary | ICD-10-CM

## 2015-09-08 DIAGNOSIS — R4189 Other symptoms and signs involving cognitive functions and awareness: Secondary | ICD-10-CM | POA: Diagnosis not present

## 2015-09-08 DIAGNOSIS — G40209 Localization-related (focal) (partial) symptomatic epilepsy and epileptic syndromes with complex partial seizures, not intractable, without status epilepticus: Secondary | ICD-10-CM

## 2015-09-08 DIAGNOSIS — Z86011 Personal history of benign neoplasm of the brain: Secondary | ICD-10-CM

## 2015-09-08 DIAGNOSIS — IMO0002 Reserved for concepts with insufficient information to code with codable children: Secondary | ICD-10-CM

## 2015-09-08 DIAGNOSIS — G4710 Hypersomnia, unspecified: Secondary | ICD-10-CM

## 2015-09-08 MED ORDER — LAMOTRIGINE 100 MG PO TABS: 100.0000 mg | ORAL_TABLET | Freq: Two times a day (BID) | ORAL | Status: DC

## 2015-09-08 NOTE — Patient Instructions (Addendum)
1.  Continue lamotrigine 100mg  twice daily 2.  Continue aspirin 81mg  daily 3.  Follow up in 6 months.

## 2015-09-08 NOTE — Progress Notes (Signed)
NEUROLOGY FOLLOW UP OFFICE NOTE  [name] [id number]  HISTORY OF PRESENT ILLNESS: [name] is a 65 year old right-handed woman with hypertension, stroke, type II diabetes, , seizure disorder, mononeuritis of leg, OSA, thyroid nodule, cervical disc disease, depression, anxiety, chronic pain, chronic back pain with gait instability and falls, polypharmacy, and history of stroke with residual left-sided weakness and meningioma resection in September 2013 who follows up for chronic headaches, seizures, and meningioma.    UPDATE: She was hospitalized in December 2016 for chest pain and worsening left arm weakness.  MRI of brain showed was personally reviewed and showed frontal encephalomalacia but no acute stroke or meningioma recurrence.  MRI of cervical spine was personally reviewed and revealed mild spondylitic changes.  She was found to have bronchitis.  She underwent OT afterwards with improvement in hand weakness.  HEADACHES/MIGRAINES: History:  She has longstanding history of multifactorial chronic headaches.  They are described as from the back of her head and top of the front of her head.  It is a pounding pain, 8/10.  There is some nausea, but no photophobia or phonophobia.  She has blurred vision, but that is constant.  She usually takes Tylenol for it, but she also is taking oxycodone for left arm pain.  She has history of medication-overuse and opioids for other pain.  Prior medications included lamotrigine, gabapentin, Lyrica, metoprolol, amitriptyline, and imipramine.  She also tried trigger point injections.  She is not a candidate for triptans due to history of stroke.  Update: stable  SEIZURES: History:  She has history of seizures for many years.  Her last seizure was about 3 years ago.  She described them as staring spells.  Her EEG from 08/08/12 revealed right temporal epileptiform discharges.    Since June 2016, she has had "spells" where she suddenly doesn't feel well.   She notes numbness in the left leg and will sometimes fall during these spells.  She had a fall in late July.  There was no loss of consciousness.  MRI of the brain performed on 02/01/15 showed a tiny area of altered signal in left pons, which location doesn't correlate with symptoms and seems more likely to be T2 shine through.  Update:  Due to spells, Lamictal level was checked.  Lamictal level was 6.5.  She was advised to increase lamictal from 1271m daily to 1052mtwice daily.  MENINGIOMA: She had a known history of meningioma.  She presented to the ED in August 2013 with sudden onset numbness of the feet and left hand weakness and numbness.  MRI of brain noted enlargement of previous planum sphenoidale meningioma compared to prior study in 2009, with vasogenic edema.  In September 2013, she underwent a bicoronal craniotomy and gross total resection.  Repeat MRI of brain with and without contrast performed on 05/01/12 revealed postop resection of meningioma of the planum sphenoidale with small amount of residual enhancing tissue along the planum sphenoidale (5 x 71m37m postop blood in the bilateral frontal lobes and bilateral encephalomalacia of the gyrus rectus bilaterally.  She had a repeat MRI of the brain with and without contrast performed in May 2016, which showed no recurrence of tumor.  STROKE: She has a history of stroke going back several years ago.  Semiology is unclear but reports left-sided issues.  She was started on Plavix, but she was switched over to ASA 44m37mA1c in July was 7.  LDL in May was 57.  Carotid doppler from  11/06/14 showed bilateral atherosclerosis with less than 50% stenosis bilaterally.  Update: Hgb A1c from 05/31/15 was 6.2.    MEMORY: She also reports problems with memory, word-finding difficulties, and problems with name recognition of acquaintances of people at church.  This has been going on for over a year.  MMSE score from 12/08/13 was 27/30 with minor  inconsistencies.  Repeat testing from 07/02/14 was stable at 26/30.  CMP from 07/19/15 showed Na 141, K 4, glucose 298, BUN 23, Cr 0.90, TB 0.2, AP 146, AST 13, ALT 12  PAST MEDICAL HISTORY: Past Medical History  Diagnosis Date  . Pancreatitis 2006    due to Depakote with normal EUS   . Osteoporosis   . Chronic back pain   . Migraines     chronic headaches  . Diverticulosis     TCS 9/08 by Dr. Delfin Edis for diarrhea . Bx for micro scopic colitis negative.   . Schatzki's ring     non critical / EGD with ED 8/2011with RMR  . Glaucoma     eye drops daily  . Anemia   . Blood transfusion   . Stroke [facility])     left sided weakness  . Metabolic encephalopathy 1/76/1607  . Sleep apnea     on CPAP  . Arthritis   . Gum symptoms     infection on antibiotic  . Chronic neck pain   . Mononeuritis lower limb   . Frequent falls   . Diabetes mellitus     Type II  . Cervical disc disorder with radiculopathy of cervical region 10/31/2012  . COPD (chronic obstructive pulmonary disease) with chronic bronchitis ([place]) 09/16/2013    Office Spirometry 10/30/2013-submaximal effort based on appearance of loop and curve. Numbers would fit with severe restriction but her physiologic capability may be better than this. FVC 0.91/44%, and 10.74/45%, FEV1/FVC 0.81, FEF 25-75% 1.43/69%    . Carpal tunnel syndrome of right wrist 05/23/2011  . Hemiplegia affecting non-dominant side, post-stroke ([place]) 08/02/2011  . Hypertension     takes Amlodipine,Metoprolol,and Clonidine daily  . Hyperlipidemia     takes Crestor daily  . Insomnia     takes Trazodone nightly  . Depression     takes Zoloft daily  . Bipolar disorder ([place])     takes Risperdal nightly  . GERD (gastroesophageal reflux disease)     takes Aciphex daily  . Anxiety     takes Ativan daily  . Hypothyroidism     takes Synthroid daily  . Seizures ([place])     takes Lamictal daily.Last seizure 3 yrs ago    MEDICATIONS: Current Outpatient  Prescriptions on File Prior to Visit  Medication Sig Dispense Refill  . ACCU-CHEK FASTCLIX LANCETS MISC Use to test blood sugar 4 times daily. Dx: E10.65 408 each 1  . albuterol (PROVENTIL HFA;VENTOLIN HFA) 108 (90 BASE) MCG/ACT inhaler Inhale 1 puff into the lungs 3 (three) times daily. 1 Inhaler 2  . Alcohol Swabs (B-D SINGLE USE SWABS REGULAR) PADS Use for injections and glucose testing 8 times daily. Dx: E10.65 900 each 1  . amLODipine (NORVASC) 5 MG tablet Take 2 tablets (10 mg total) by mouth daily. 60 tablet 3  . aspirin EC 81 MG tablet Take 81 mg by mouth daily.    . Azelastine-Fluticasone (DYMISTA) 137-50 MCG/ACT SUSP Place 1 puff into the nose at bedtime. 1 Bottle 0  . BD PEN NEEDLE NANO U/F 32G X 4 MM MISC USE FOUR TIMES DAILY 360 each  3  . Blood Glucose Calibration (ACCU-CHEK SMARTVIEW CONTROL) LIQD 1 each by Other route as needed. 1 each 2  . Blood Glucose Monitoring Suppl (ACCU-CHEK NANO SMARTVIEW) W/DEVICE KIT 1 each by Does not apply route daily. Dx: E10.65 1 kit 0  . Cholecalciferol (VITAMIN D) 2000 units CAPS Take 1 capsule by mouth daily.    . clobetasol cream (TEMOVATE) 8.24 % Apply 1 application topically daily.    . cloNIDine (CATAPRES) 0.3 MG tablet TAKE 1 TABLET BY MOUTH EVERY EIGHT HOURS. ONCE AT 8AM, 4PM, AND 12 MIDNIGHT. 90 tablet 3  . conjugated estrogens (PREMARIN) vaginal cream Place 1 Applicatorful vaginally 2 (two) times a week. 30 g 1  . diclofenac sodium (VOLTAREN) 1 % GEL Apply 2 g topically daily as needed (Pain). 100 g 2  . dicyclomine (BENTYL) 10 MG capsule TAKE 1 CAPSULE BY MOUTH THREE TIMES DAILY BEFORE MEALS. 90 capsule 2  . glucose blood (ACCU-CHEK SMARTVIEW) test strip Use to test blood sugar 4 times daily as instructed. Dx: E10.65 375 each 1  . hydroxychloroquine (PLAQUENIL) 200 MG tablet Take 200 mg by mouth daily.    . insulin aspart (NOVOLOG FLEXPEN) 100 UNIT/ML FlexPen Inject 14-18 Units into the skin 3 (three) times daily with meals. 30 mL 1  .  Insulin Glargine (TOUJEO SOLOSTAR) 300 UNIT/ML SOPN Inject 25 Units into the skin at bedtime. 6 pen 1  . levothyroxine (SYNTHROID, LEVOTHROID) 50 MCG tablet TAKE 1 TABLET BY MOUTH ONCE DAILY AND 1/2 TABLET ON SUNDAYS. 30 tablet 2  . LORazepam (ATIVAN) 0.5 MG tablet Take 1 tablet (0.5 mg total) by mouth 3 (three) times daily. 90 tablet 2  . losartan (COZAAR) 25 MG tablet Take 1 tablet (25 mg total) by mouth daily. 30 tablet 5  . Magnesium 250 MG TABS Take 1 tablet by mouth daily.    . meclizine (ANTIVERT) 12.5 MG tablet Take 1 tablet (12.5 mg total) by mouth 3 (three) times daily as needed for dizziness. 15 tablet 0  . methocarbamol (ROBAXIN) 500 MG tablet Take 500 mg by mouth daily.    . metoprolol (LOPRESSOR) 50 MG tablet TAKE 1 TABLET BY MOUTH TWICE DAILY. 180 tablet 0  . montelukast (SINGULAIR) 10 MG tablet TAKE ONE TABLET BY MOUTH ONCE DAILY. 30 tablet 3  . Multiple Vitamins-Minerals (ONE-A-DAY 50 PLUS PO) Take 1 tablet by mouth daily.    [name] Nystatin (NYAMYC) 100000 UNIT/GM POWD APPLY TO AFFECTED AREA 4 TIMES DAILY. 30 g 2  . polyethylene glycol powder (MIRALAX) powder Take 17 g by mouth daily. To prevent constipation 255 g prn  . pregabalin (LYRICA) 75 MG capsule Take 75 mg by mouth 2 (two) times daily.     . RABEprazole (ACIPHEX) 20 MG tablet TAKE 1 TABLET BY MOUTH TWICE DAILY. 60 tablet 5  . RESTASIS 0.05 % ophthalmic emulsion Place 1 drop into both eyes 2 (two) times daily.     . risperiDONE (RISPERDAL) 0.5 MG tablet Take 1 tablet (0.5 mg total) by mouth at bedtime. 30 tablet 3  . rosuvastatin (CRESTOR) 5 MG tablet TAKE 1 TABLET BY MOUTH AT BEDTIME. 30 tablet 3  . sertraline (ZOLOFT) 100 MG tablet Take 2 tablets (200 mg total) by mouth daily. 60 tablet 3  . traZODone (DESYREL) 150 MG tablet Take 2 tablets (300 mg total) by mouth at bedtime. 60 tablet 3  . vitamin B-12 (CYANOCOBALAMIN) 1000 MCG tablet Take 1,000 mcg by mouth daily.    . vitamin E 400 UNIT capsule  Take 400 Units by mouth  daily.     No current facility-administered medications on file prior to visit.    ALLERGIES: Allergies  Allergen Reactions  . Cephalexin Hives  . Iron Nausea And Vomiting  . Milk-Related Compounds Other (See Comments)    Doesn't agree with stomach.   . Penicillins Hives    Has patient had a PCN reaction causing immediate rash, facial/tongue/throat swelling, SOB or lightheadedness with hypotension: Yes Has patient had a PCN reaction causing severe rash involving mucus membranes or skin necrosis: No Has patient had a PCN reaction that required hospitalization No Has patient had a PCN reaction occurring within the last 10 years: No If all of the above answers are "NO", then may proceed with Cephalosporin use.   [name] Phenazopyridine Hcl Hives          FAMILY HISTORY: Family History  Problem Relation Age of Onset  . Heart attack Mother     HTN  . Pneumonia Father   . Kidney failure Father   . Diabetes Father   . Pancreatic cancer Sister   . Diabetes Brother   . Hypertension Brother   . Diabetes Brother   . Colon cancer Neg Hx   . Anesthesia problems Neg Hx   . Hypotension Neg Hx   . Malignant hyperthermia Neg Hx   . Pseudochol deficiency Neg Hx   . Stroke Maternal Grandmother   . Heart attack Maternal Grandfather   . Cancer Sister     breast   . Alcohol abuse Maternal Uncle   . Hypertension Son   . Sleep apnea Son   . Cancer Sister     pancreatic   SOCIAL HISTORY: Social History   Social History  . Marital Status: Divorced    Spouse Name: N/A  . Number of Children: 1  . Years of Education: 12   Occupational History  . disabled     Social History Main Topics  . Smoking status: Current Every Day Smoker -- 0.50 packs/day for 7 years    Types: Cigarettes  . Smokeless tobacco: Never Used     Comment: continues to smoke 1/2 pack a day   . Alcohol Use: No     Comment:    . Drug Use: No  . Sexual Activity: No   Other Topics Concern  . Not on file   Social  History Narrative    REVIEW OF SYSTEMS: Constitutional: No fevers, chills, or sweats, no generalized fatigue, change in appetite Eyes: No visual changes, double vision, eye pain Ear, nose and throat: No hearing loss, ear pain, nasal congestion, sore throat Cardiovascular: No chest pain, palpitations Respiratory:  No shortness of breath at rest or with exertion, wheezes GastrointestinaI: No nausea, vomiting, diarrhea, abdominal pain, fecal incontinence Genitourinary:  No dysuria, urinary retention or frequency Musculoskeletal:  No neck pain, back pain Integumentary: No rash, pruritus, skin lesions Neurological: as above Psychiatric: No depression, insomnia, anxiety Endocrine: No palpitations, fatigue, diaphoresis, mood swings, change in appetite, change in weight, increased thirst Hematologic/Lymphatic:  No purpura, petechiae. Allergic/Immunologic: no itchy/runny eyes, nasal congestion, recent allergic reactions, rashes  PHYSICAL EXAM: Filed Vitals:   09/08/15 0828  BP: 134/56  Pulse: 65   General: No acute distress.  Patient appears well-groomed.   Head:  Normocephalic/atraumatic Eyes:  Fundi examined but not visualized Neck: supple, no paraspinal tenderness, full range of motion Heart:  Regular rate and rhythm Lungs:  Clear to auscultation bilaterally Back: No paraspinal tenderness Neurological Exam: alert and  oriented to person, place, and time. Attention span and concentration intact, recent and remote memory intact, fund of knowledge intact.  Speech slowed and dysarthric, language intact.   MMSE - Mini Mental State Exam 09/08/2015 07/02/2014 12/08/2013  Orientation to time _0 Orientation to Place _1 Registration _2 Attention/ Calculation _3 Recall _4 Language- name 2 objects _5 Language- repeat 0 1 1  Language- follow 3 step command _6 Language- read & follow direction _7 Write a sentence 1 0 1  Copy design 0 0 0  Total score _8 CN II-XII intact. Fundi not visualized. Bulk and tone normal. 5-/5 left hip flexion, otherwise 5/5.  Reduced pinprick sensation to left arm and leg. DTR 1+ throughout, except absent at ankles. Finger to nose slowed but no dysmetria. Wide-based gait.   IMPRESSION: Worsening left arm weakness may have been worsening of chronic deficits due to infection (bronchitis). Hypersomnolence, chronic Localization-related epilepsy, stable History of meningioma resection Left-sided hemiplegia as late effect of stroke Memory deficits, multifactorial, related to vascular cognitive impairment, hypersomnolence and polypharmacy, stable Tobacco use  PLAN: 1.  Lamictal 162m twice daily 2.  ASA 865mdaily 3.  On Crestor as managed by PCP.  LDL goal should be less than 70 4.  Smoking cessation 5.  Follow up in 6 months  15 minutes spent face to face with patient, over 50% spent discussing management.  AdMetta ClinesDO  CC: MaTula NakayamaMD

## 2015-09-09 DIAGNOSIS — M5090 Cervical disc disorder, unspecified, unspecified cervical region: Secondary | ICD-10-CM | POA: Diagnosis not present

## 2015-09-09 DIAGNOSIS — M961 Postlaminectomy syndrome, not elsewhere classified: Secondary | ICD-10-CM | POA: Diagnosis not present

## 2015-09-09 DIAGNOSIS — G894 Chronic pain syndrome: Secondary | ICD-10-CM | POA: Diagnosis not present

## 2015-09-09 DIAGNOSIS — Z79891 Long term (current) use of opiate analgesic: Secondary | ICD-10-CM | POA: Diagnosis not present

## 2015-09-20 DIAGNOSIS — J440 Chronic obstructive pulmonary disease with acute lower respiratory infection: Secondary | ICD-10-CM | POA: Diagnosis not present

## 2015-09-21 DIAGNOSIS — K1321 Leukoplakia of oral mucosa, including tongue: Secondary | ICD-10-CM | POA: Diagnosis not present

## 2015-09-21 DIAGNOSIS — K1329 Other disturbances of oral epithelium, including tongue: Secondary | ICD-10-CM | POA: Diagnosis not present

## 2015-09-22 DIAGNOSIS — L603 Nail dystrophy: Secondary | ICD-10-CM | POA: Diagnosis not present

## 2015-09-22 DIAGNOSIS — I739 Peripheral vascular disease, unspecified: Secondary | ICD-10-CM | POA: Diagnosis not present

## 2015-09-22 DIAGNOSIS — E1051 Type 1 diabetes mellitus with diabetic peripheral angiopathy without gangrene: Secondary | ICD-10-CM | POA: Diagnosis not present

## 2015-09-22 DIAGNOSIS — L84 Corns and callosities: Secondary | ICD-10-CM | POA: Diagnosis not present

## 2015-09-27 ENCOUNTER — Telehealth: Payer: Self-pay

## 2015-09-27 NOTE — Telephone Encounter (Signed)
Patient aware, does not want to go to ED but verbalized understanding.

## 2015-09-27 NOTE — Telephone Encounter (Signed)
Needs to go to ED

## 2015-09-30 ENCOUNTER — Ambulatory Visit (INDEPENDENT_AMBULATORY_CARE_PROVIDER_SITE_OTHER): Payer: Commercial Managed Care - HMO | Admitting: Otolaryngology

## 2015-09-30 DIAGNOSIS — R040 Epistaxis: Secondary | ICD-10-CM

## 2015-10-01 DIAGNOSIS — E119 Type 2 diabetes mellitus without complications: Secondary | ICD-10-CM | POA: Diagnosis not present

## 2015-10-01 DIAGNOSIS — M150 Primary generalized (osteo)arthritis: Secondary | ICD-10-CM | POA: Diagnosis not present

## 2015-10-01 DIAGNOSIS — I639 Cerebral infarction, unspecified: Secondary | ICD-10-CM | POA: Diagnosis not present

## 2015-10-01 DIAGNOSIS — R32 Unspecified urinary incontinence: Secondary | ICD-10-CM | POA: Diagnosis not present

## 2015-10-04 DIAGNOSIS — J440 Chronic obstructive pulmonary disease with acute lower respiratory infection: Secondary | ICD-10-CM | POA: Diagnosis not present

## 2015-10-06 ENCOUNTER — Other Ambulatory Visit (HOSPITAL_COMMUNITY): Payer: Self-pay | Admitting: Psychiatry

## 2015-10-06 DIAGNOSIS — M79609 Pain in unspecified limb: Secondary | ICD-10-CM | POA: Diagnosis not present

## 2015-10-08 ENCOUNTER — Encounter: Payer: Self-pay | Admitting: Family Medicine

## 2015-10-08 ENCOUNTER — Other Ambulatory Visit: Payer: Self-pay | Admitting: Neurology

## 2015-10-08 DIAGNOSIS — Z0100 Encounter for examination of eyes and vision without abnormal findings: Secondary | ICD-10-CM | POA: Diagnosis not present

## 2015-10-08 DIAGNOSIS — H524 Presbyopia: Secondary | ICD-10-CM | POA: Diagnosis not present

## 2015-10-08 DIAGNOSIS — H5210 Myopia, unspecified eye: Secondary | ICD-10-CM | POA: Diagnosis not present

## 2015-10-08 DIAGNOSIS — H2510 Age-related nuclear cataract, unspecified eye: Secondary | ICD-10-CM | POA: Diagnosis not present

## 2015-10-08 LAB — HM DIABETES EYE EXAM

## 2015-10-08 NOTE — Telephone Encounter (Signed)
Last OV: 08/17/15 Next OV: 03/09/16  1.  Lamictal 100mg  twice daily

## 2015-10-13 ENCOUNTER — Telehealth: Payer: Self-pay | Admitting: Internal Medicine

## 2015-10-13 NOTE — Telephone Encounter (Signed)
Patient stated that her b/s have been running in the 300, please advise on what to do.

## 2015-10-13 NOTE — Telephone Encounter (Signed)
I contacted the pt and advised of note below. She voiced understanding and had no further questions at this time.

## 2015-10-13 NOTE — Telephone Encounter (Signed)
I contacted the pt. Blood sugar readings below: 10/11/2015: Fasting: 147 After lunch: 330 4pm: 236 Before supper: 150 At bedtime: 296  10/12/2015: Fasting: 177 Supper time 177  Bedtime: 100  10/13/2015: Fasting: 130  After lunch: 332 445 pm: 234  Pt is taking between 10 and 18 units of novolog depening upon the size of her meals and 25 units of Toujeo. She confirmed she is not taking any steroid products.

## 2015-10-13 NOTE — Telephone Encounter (Signed)
She needs to add about 6 more units of Novolog to what she is doing at each meal, no change in Toujeo since morning blood sugars are better

## 2015-10-13 NOTE — Telephone Encounter (Signed)
Please take down the blood sugar readings by time of day for the last 3 days. Please confirm what doses of insulin she is taking She getting any prednisone or injectable cortisone products?

## 2015-10-18 ENCOUNTER — Inpatient Hospital Stay (HOSPITAL_COMMUNITY): Admission: RE | Admit: 2015-10-18 | Payer: Self-pay | Source: Ambulatory Visit

## 2015-10-18 ENCOUNTER — Other Ambulatory Visit (HOSPITAL_COMMUNITY): Payer: Self-pay | Admitting: Psychiatry

## 2015-10-18 ENCOUNTER — Encounter (HOSPITAL_COMMUNITY): Payer: Commercial Managed Care - HMO

## 2015-10-18 ENCOUNTER — Ambulatory Visit (HOSPITAL_COMMUNITY): Payer: Commercial Managed Care - HMO

## 2015-10-18 ENCOUNTER — Other Ambulatory Visit: Payer: Self-pay | Admitting: Family Medicine

## 2015-10-18 DIAGNOSIS — G47411 Narcolepsy with cataplexy: Secondary | ICD-10-CM | POA: Diagnosis not present

## 2015-10-18 DIAGNOSIS — J440 Chronic obstructive pulmonary disease with acute lower respiratory infection: Secondary | ICD-10-CM | POA: Diagnosis not present

## 2015-10-18 DIAGNOSIS — I10 Essential (primary) hypertension: Secondary | ICD-10-CM | POA: Diagnosis not present

## 2015-10-18 DIAGNOSIS — M5137 Other intervertebral disc degeneration, lumbosacral region: Secondary | ICD-10-CM | POA: Diagnosis not present

## 2015-10-18 DIAGNOSIS — M810 Age-related osteoporosis without current pathological fracture: Secondary | ICD-10-CM | POA: Diagnosis not present

## 2015-10-22 ENCOUNTER — Ambulatory Visit (INDEPENDENT_AMBULATORY_CARE_PROVIDER_SITE_OTHER): Payer: Commercial Managed Care - HMO | Admitting: Psychiatry

## 2015-10-22 ENCOUNTER — Encounter (HOSPITAL_COMMUNITY): Payer: Self-pay | Admitting: Psychiatry

## 2015-10-22 VITALS — BP 94/59 | HR 70 | Ht 60.0 in | Wt 162.2 lb

## 2015-10-22 DIAGNOSIS — F331 Major depressive disorder, recurrent, moderate: Secondary | ICD-10-CM

## 2015-10-22 MED ORDER — SERTRALINE HCL 100 MG PO TABS: 200.0000 mg | ORAL_TABLET | Freq: Every day | ORAL | 3 refills | Status: DC

## 2015-10-22 MED ORDER — RISPERIDONE 0.5 MG PO TABS: 0.5000 mg | ORAL_TABLET | Freq: Every day | ORAL | 3 refills | Status: DC

## 2015-10-22 MED ORDER — TRAZODONE HCL 150 MG PO TABS: 300.0000 mg | ORAL_TABLET | Freq: Every day | ORAL | 3 refills | Status: DC

## 2015-10-22 MED ORDER — LORAZEPAM 0.5 MG PO TABS: 0.5000 mg | ORAL_TABLET | Freq: Three times a day (TID) | ORAL | 2 refills | Status: DC

## 2015-10-22 MED ORDER — LORAZEPAM 0.5 MG PO TABS: 0.5000 mg | ORAL_TABLET | Freq: Three times a day (TID) | ORAL | 3 refills | Status: DC
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
## 2015-10-25 ENCOUNTER — Encounter: Payer: Self-pay | Admitting: Family Medicine

## 2015-10-25 ENCOUNTER — Ambulatory Visit: Payer: Self-pay | Admitting: Adult Health

## 2015-10-25 ENCOUNTER — Ambulatory Visit (INDEPENDENT_AMBULATORY_CARE_PROVIDER_SITE_OTHER): Payer: Commercial Managed Care - HMO | Admitting: Family Medicine

## 2015-10-25 VITALS — BP 178/80 | HR 62 | Resp 18 | Ht 60.0 in | Wt 162.0 lb

## 2015-10-25 DIAGNOSIS — E038 Other specified hypothyroidism: Secondary | ICD-10-CM

## 2015-10-25 DIAGNOSIS — E1159 Type 2 diabetes mellitus with other circulatory complications: Secondary | ICD-10-CM

## 2015-10-25 DIAGNOSIS — I10 Essential (primary) hypertension: Secondary | ICD-10-CM | POA: Diagnosis not present

## 2015-10-25 DIAGNOSIS — K589 Irritable bowel syndrome without diarrhea: Secondary | ICD-10-CM | POA: Diagnosis not present

## 2015-10-25 DIAGNOSIS — K219 Gastro-esophageal reflux disease without esophagitis: Secondary | ICD-10-CM | POA: Diagnosis not present

## 2015-10-25 DIAGNOSIS — K588 Other irritable bowel syndrome: Secondary | ICD-10-CM

## 2015-10-25 DIAGNOSIS — Z720 Tobacco use: Secondary | ICD-10-CM

## 2015-10-25 DIAGNOSIS — E1165 Type 2 diabetes mellitus with hyperglycemia: Secondary | ICD-10-CM

## 2015-10-25 DIAGNOSIS — F17210 Nicotine dependence, cigarettes, uncomplicated: Secondary | ICD-10-CM

## 2015-10-25 DIAGNOSIS — M5440 Lumbago with sciatica, unspecified side: Secondary | ICD-10-CM

## 2015-10-25 DIAGNOSIS — N3000 Acute cystitis without hematuria: Secondary | ICD-10-CM | POA: Diagnosis not present

## 2015-10-25 DIAGNOSIS — R0989 Other specified symptoms and signs involving the circulatory and respiratory systems: Secondary | ICD-10-CM

## 2015-10-25 LAB — POCT URINALYSIS DIPSTICK
BILIRUBIN UA: NEGATIVE
GLUCOSE UA: NEGATIVE
Ketones, UA: NEGATIVE
NITRITE UA: NEGATIVE
Protein, UA: NEGATIVE
RBC UA: NEGATIVE
Spec Grav, UA: 1.015
Urobilinogen, UA: 0.2
pH, UA: 5.5

## 2015-10-25 MED ORDER — CIPROFLOXACIN HCL 500 MG PO TABS: 500.0000 mg | ORAL_TABLET | Freq: Two times a day (BID) | ORAL | 0 refills | Status: DC

## 2015-10-25 MED ORDER — LOSARTAN POTASSIUM 50 MG PO TABS: 50.0000 mg | ORAL_TABLET | Freq: Every day | ORAL | 4 refills | Status: DC

## 2015-10-25 NOTE — Assessment & Plan Note (Addendum)
4 day history of symptoms with abnormal UA, antibioitic for 3 days and check c/s

## 2015-10-25 NOTE — Assessment & Plan Note (Signed)
Controlled, no change in medication Managed by endo 

## 2015-10-25 NOTE — Progress Notes (Signed)
[name]     MRN: [id number]      DOB: April 15, 1950   HPI [name] is here for follow up and re-evaluation of chronic medical conditions, medication management and review of any available recent lab and radiology data.  Preventive health is updated, specifically  Cancer screening and Immunization.   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  4 day h/o urinary frequency and frequency. ROS Denies recent fever or chills. Denies sinus pressure, nasal congestion, ear pain or sore throat. Denies chest congestion, productive cough or wheezing. Denies chest pains, palpitations and leg swelling Denies abdominal pain, nausea, vomiting,diarrhea or constipation.   4 day h/o pressure and urinary frequency also has left flank pain, no fever or chills C/o back and lower extremity pain and limitation in mobility. Denies headaches, seizures, numbness, or tingling. Denies uncontrolled  depression, anxiety or insomnia. Denies skin break down or rash.   PE  BP 140/68   Pulse 62   Resp 18   Ht 5' (1.524 m)   Wt 162 lb (73.5 kg)   LMP  (Exact Date)   SpO2 98%   BMI 31.64 kg/m   Patient alert and oriented and in no cardiopulmonary distress.  HEENT: No facial asymmetry, EOMI,   oropharynx pink and moist.  Neck supple no JVD, no mass.  Chest: Clear to auscultation bilaterally.  CVS: S1, S2 no murmurs, no S3.Regular rate.  ABD: Soft non tender.   Ext: No edema  MS: Adequate though reduced  ROM spine, shoulders, hips and knees.  Skin: Intact, no ulcerations or rash noted.  Psych: Good eye contact, normal affect. Memory intact not anxious or depressed appearing.  CNS: CN 2-12 intact, power,  normal throughout.no focal deficits  noted.   Assessment & Plan  Acute cystitis without hematuria 4 day history of symptoms with abnormal UA, antibioitic for 3 days and check c/s  Labile hypertension Elevated SBP, cozaar dose increased  DASH diet and commitment to daily physical activity for a minimum of 30 minutes discussed and encouraged, as a part of hypertension management. The importance of attaining a healthy weight is also discussed.  BP/Weight 10/25/2015 09/08/2015 09/01/2015 08/31/2015 08/17/2015 08/11/2015 [id number]  Systolic BP 0000000 Q000111Q Q000111Q 123456 [id number] 0000000 Q000111Q  Diastolic BP 80 56 72 60 60 64 60  Wt. (Lbs) 162 161 160 159 160 154 158  BMI 31.64 31.44 31.25 30.06 30.25 29.11 29.87  Some encounter information is confidential and restricted. Go to Review Flowsheets activity to see all data.       Poorly controlled type 2 diabetes mellitus with circulatory disorder ([place]) Reports poor control, however , states with recent dose increase there is improvement [name] is reminded of the importance of commitment to daily physical activity for 30 minutes or more, as able and the need to limit carbohydrate intake to 30 to 60 grams per meal to help with blood sugar control.   The need to take medication as prescribed, test blood sugar as directed, and to call between visits if there is a concern that blood sugar is uncontrolled is also discussed.   [name] is reminded of the importance of daily foot exam, annual eye examination, and good blood  sugar, blood pressure and cholesterol control.  Diabetic Labs Latest Ref Rng & Units 07/19/2015 06/30/2015 06/23/2015 05/31/2015 05/01/2015  HbA1c 4.8 - 5.6 % - - - 6.2(H) -  Microalbumin <2.0 mg/dL - - - - -  Micro/Creat Ratio 0.0 - 30.0 mg/g - - - - -  Chol 125 - 200 mg/dL 136 - - - -  HDL >=46 mg/dL 44(L) - - - -  Calc LDL <130 mg/dL 66 - - - -  Triglycerides <150 mg/dL 131 - - - -  Creatinine 0.50 - 0.99 mg/dL 0.90 0.87 0.94 0.90 1.02(H)  GFR >60.00 mL/min - - - - -   BP/Weight  10/25/2015 09/08/2015 09/01/2015 08/31/2015 08/17/2015 08/11/2015 [id number]  Systolic BP 0000000 Q000111Q Q000111Q 123456 [id number] 0000000 Q000111Q  Diastolic BP 80 56 72 60 60 64 60  Wt. (Lbs) 162 161 160 159 160 154 158  BMI 31.64 31.44 31.25 30.06 30.25 29.11 29.87  Some encounter information is confidential and restricted. Go to Review Flowsheets activity to see all data.   Foot/eye exam completion dates Latest Ref Rng & Units 08/11/2015 10/06/2014  Eye Exam No Retinopathy - No Retinopathy  Foot exam Order - - -  Foot Form Completion - Done -         Light cigarette smoker (1-9 cigarettes per day) Patient counseled for approximately 5 minutes regarding the health risks of ongoing nicotine use, specifically all types of cancer, heart disease, stroke and respiratory failure. The options available for help with cessation ,the behavioral changes to assist the process, and the option to either gradully reduce usage  Or abruptly stop.is also discussed. Pt is also encouraged to set specific goals in number of cigarettes used daily, as well as to set a quit date.     GERD Currently controlled but wants to re establish with previous Provider, referral enetered  Hypothyroidism Controlled, no change in medication Managed by endo  Backache Chronic , has had back surgery, followed by pain clinic , reports increased and uncontrolled pain, no recent trauma , defer to pain mx  IBS Chronic, no current flare , gI referral toProvider of her choice , requests return to prior GI Doc

## 2015-10-25 NOTE — Assessment & Plan Note (Signed)
Elevated SBP, cozaar dose increased  DASH diet and commitment to daily physical activity for a minimum of 30 minutes discussed and encouraged, as a part of hypertension management. The importance of attaining a healthy weight is also discussed.  BP/Weight 10/25/2015 09/08/2015 09/01/2015 08/31/2015 08/17/2015 08/11/2015 [id number]  Systolic BP 0000000 Q000111Q Q000111Q 123456 [id number] 0000000 Q000111Q  Diastolic BP 80 56 72 60 60 64 60  Wt. (Lbs) 162 161 160 159 160 154 158  BMI 31.64 31.44 31.25 30.06 30.25 29.11 29.87  Some encounter information is confidential and restricted. Go to Review Flowsheets activity to see all data.

## 2015-10-25 NOTE — Assessment & Plan Note (Signed)
Chronic, no current flare , gI referral toProvider of her choice , requests return to prior GI Doc

## 2015-10-25 NOTE — Assessment & Plan Note (Signed)
Currently controlled but wants to re establish with previous Provider, referral enetered

## 2015-10-25 NOTE — Assessment & Plan Note (Signed)
Reports poor control, however , states with recent dose increase there is improvement [name] is reminded of the importance of commitment to daily physical activity for 30 minutes or more, as able and the need to limit carbohydrate intake to 30 to 60 grams per meal to help with blood sugar control.   The need to take medication as prescribed, test blood sugar as directed, and to call between visits if there is a concern that blood sugar is uncontrolled is also discussed.   [name] is reminded of the importance of daily foot exam, annual eye examination, and good blood sugar, blood pressure and cholesterol control.  Diabetic Labs Latest Ref Rng & Units 07/19/2015 06/30/2015 06/23/2015 05/31/2015 05/01/2015  HbA1c 4.8 - 5.6 % - - - 6.2(H) -  Microalbumin <2.0 mg/dL - - - - -  Micro/Creat Ratio 0.0 - 30.0 mg/g - - - - -  Chol 125 - 200 mg/dL 136 - - - -  HDL >=46 mg/dL 44(L) - - - -  Calc LDL <130 mg/dL 66 - - - -  Triglycerides <150 mg/dL 131 - - - -  Creatinine 0.50 - 0.99 mg/dL 0.90 0.87 0.94 0.90 1.02(H)  GFR >60.00 mL/min - - - - -   BP/Weight 10/25/2015 09/08/2015 09/01/2015 08/31/2015 08/17/2015 08/11/2015 [id number]  Systolic BP 0000000 Q000111Q Q000111Q [id number] 0000000 Q000111Q  Diastolic BP 80 56 72 60 60 64 60  Wt. (Lbs) 162 161 160 159 160 154 158  BMI 31.64 31.44 31.25 30.06 30.25 29.11 29.87  Some encounter information is confidential and restricted. Go to Review Flowsheets activity to see all data.   Foot/eye exam completion dates Latest Ref Rng & Units 08/11/2015 10/06/2014  Eye Exam No Retinopathy - No Retinopathy  Foot exam Order - - -  Foot Form Completion - Done -

## 2015-10-25 NOTE — Assessment & Plan Note (Signed)
Patient counseled for approximately 5 minutes regarding the health risks of ongoing nicotine use, specifically all types of cancer, heart disease, stroke and respiratory failure. The options available for help with cessation ,the behavioral changes to assist the process, and the option to either gradully reduce usage  Or abruptly stop.is also discussed. Pt is also encouraged to set specific goals in number of cigarettes used daily, as well as to set a quit date.  

## 2015-10-25 NOTE — Assessment & Plan Note (Signed)
Chronic , has had back surgery, followed by pain clinic , reports increased and uncontrolled pain, no recent trauma , defer to pain mx

## 2015-10-25 NOTE — Patient Instructions (Addendum)
F/u in 4 month call if you need me before  Medication sent for urinary infection  Increase cozaar to 50 mg daily, bP is high  PLEASE work on [employer] all NICOTINE, you are making progress  Thank you  for choosing [facility]. We consider it a privelige to serve you.  Delivering excellent health care in a caring and  compassionate way is our goal.  Partnering with you,  so that together we can achieve this goal is our strategy. You are referred to "new GI Doc" per your request

## 2015-10-27 ENCOUNTER — Telehealth: Payer: Self-pay | Admitting: Internal Medicine

## 2015-10-27 NOTE — Telephone Encounter (Signed)
Pt is wanting to transfer from [facility] to our office states she was not satisfied with her care there. Pt stated she did not have a preference who she is seen by here. Pt was previously a Dr.Brodie pt in 2008. All previous records are in [place]. Sent to be be reviewed by Doctor of the day Dr.Pyrtle.

## 2015-10-27 NOTE — Telephone Encounter (Signed)
Pt has been evaluated extensive by Dr. Gala Romney and had dynamic pelvic MRI showing pelvic floor dyssynergia and rectocele plus cystocele He recommended gynecologic involvement and probable repair Please ensure this was performed Given pelvic floor dysfunction, she may need anorectal manometry. I will forward to Dr. Silverio Decamp for her input and opinion.

## 2015-10-27 NOTE — Telephone Encounter (Signed)
Dr. Hilarie Fredrickson please advise if you will accept pt or not as doc of the day.

## 2015-10-28 ENCOUNTER — Encounter (HOSPITAL_COMMUNITY)
Admission: RE | Admit: 2015-10-28 | Discharge: 2015-10-28 | Disposition: A | Discharge status: Home / Self Care | Payer: Commercial Managed Care - HMO | Source: Ambulatory Visit | Attending: Adult Health | Admitting: Adult Health

## 2015-10-28 ENCOUNTER — Inpatient Hospital Stay (HOSPITAL_COMMUNITY): Admission: RE | Admit: 2015-10-28 | Payer: Self-pay | Source: Ambulatory Visit

## 2015-10-28 ENCOUNTER — Encounter (HOSPITAL_COMMUNITY): Payer: Self-pay

## 2015-10-28 DIAGNOSIS — R0789 Other chest pain: Secondary | ICD-10-CM

## 2015-10-28 DIAGNOSIS — R0602 Shortness of breath: Secondary | ICD-10-CM | POA: Diagnosis not present

## 2015-10-28 DIAGNOSIS — R079 Chest pain, unspecified: Secondary | ICD-10-CM | POA: Diagnosis not present

## 2015-10-28 HISTORY — DX: Malignant (primary) neoplasm, unspecified: C80.1

## 2015-10-28 LAB — NM MYOCAR MULTI W/SPECT W/WALL MOTION / EF
CHL CUP NUCLEAR SDS: 1
CHL CUP NUCLEAR SRS: 0
CHL CUP RESTING HR STRESS: 52 {beats}/min
LHR: 0.42
LV dias vol: 69 mL (ref 46–106)
LVSYSVOL: 17 mL
Peak HR: 79 {beats}/min
SSS: 1
TID: 1.05

## 2015-10-28 MED ORDER — TECHNETIUM TC 99M SESTAMIBI - CARDIOLITE
10.0000 | Freq: Once | INTRAVENOUS | Status: AC | PRN
  Administered 2015-10-28: 10:00:00 11 via INTRAVENOUS

## 2015-10-28 MED ORDER — REGADENOSON 0.4 MG/5ML IV SOLN
INTRAVENOUS | Status: AC
  Administered 2015-10-28: 0.4 mg via INTRAVENOUS
  Filled 2015-10-28: qty 5

## 2015-10-28 MED ORDER — SODIUM CHLORIDE 0.9% FLUSH
INTRAVENOUS | Status: AC
  Administered 2015-10-28: 10 mL via INTRAVENOUS
  Filled 2015-10-28: qty 10

## 2015-10-28 MED ORDER — TECHNETIUM TC 99M TETROFOSMIN IV KIT
30.0000 | PACK | Freq: Once | INTRAVENOUS | Status: AC | PRN
  Administered 2015-10-28: 33 via INTRAVENOUS

## 2015-10-28 NOTE — Telephone Encounter (Signed)
I contacted patient who states that Dr Roseanne Kaufman office called and told her about pelvic floor dysfunction. At that time, she was sent to Dr Glo Herring for surgery (see 06/29/15 op note in [facility])  and did have that surgery. She states that she is doing better from that standpoint but still has some lower abdominal cramping with bowel movements and does not want to continue care with Dr Gala Romney. Patient has been scheduled to see Dr Hilarie Fredrickson on 01/07/16 @ 9:30 am. She verbalizes understanding.

## 2015-10-28 NOTE — Telephone Encounter (Signed)
She will benefit from biofeedback and pelvic floor physical therapy. I refer my patients to [facility] for pelvic PT.  Given she already has evidence of dyssynergia on dynamic pelvic MRI, can consider holding off ano rectal manometry for now.  Thanks VN

## 2015-10-28 NOTE — Telephone Encounter (Signed)
Thanks Dr. Toy Cookey, please touch base with the patient.  She has been dx with pelvic floor dysfunction as likely cause of her defecation issues which she has discussed with Dr. Gala Romney.  It does not appear she was seen again after her MRI which made the diagnosis. If seen here, our recommendation would be referral for pelvic PT with [facility]. She can return to Dr. Gala Romney, be referred, or be booked for next available with me based on patient preference

## 2015-10-29 ENCOUNTER — Encounter: Payer: Self-pay | Admitting: Internal Medicine

## 2015-10-29 ENCOUNTER — Ambulatory Visit (INDEPENDENT_AMBULATORY_CARE_PROVIDER_SITE_OTHER): Payer: Commercial Managed Care - HMO | Admitting: Internal Medicine

## 2015-10-29 ENCOUNTER — Other Ambulatory Visit: Payer: Self-pay

## 2015-10-29 VITALS — BP 92/58 | HR 68 | Ht 61.0 in | Wt 160.0 lb

## 2015-10-29 DIAGNOSIS — E042 Nontoxic multinodular goiter: Secondary | ICD-10-CM | POA: Diagnosis not present

## 2015-10-29 DIAGNOSIS — E039 Hypothyroidism, unspecified: Secondary | ICD-10-CM

## 2015-10-29 MED ORDER — INSULIN ASPART 100 UNIT/ML FLEXPEN: 16.0000 [IU] | PEN_INJECTOR | Freq: Three times a day (TID) | SUBCUTANEOUS | 2 refills | Status: DC

## 2015-10-29 MED ORDER — INSULIN GLARGINE 300 UNIT/ML ~~LOC~~ SOPN: 25.0000 [IU] | PEN_INJECTOR | Freq: Every day | SUBCUTANEOUS | 1 refills | Status: DC

## 2015-10-29 NOTE — Patient Instructions (Addendum)
Please continue: - Toujeo 25 units at bedtime - Novolog:  16 units with a smaller meal  30 units with a larger meal or with lunch  Please continue: - continue Levothyroxine 50 mcg daily and on Sunday, 25 mcg.  Take the thyroid hormone every day, with water, at least 30 minutes before breakfast, separated by at least 4 hours from: - acid reflux medications - calcium - iron - multivitamins  Please return in 3 months with your sugar log.

## 2015-10-29 NOTE — Progress Notes (Addendum)
Patient ID: [name], female   DOB: Nov 10, 1950, 65 y.o.   MRN: [id number]  HPI: [name] is a 58 y.o.-year-old female, initially referred by her PCP, [name], for management of DM2, dx 1995, insulin-dependent since 1997, uncontrolled, with complications (gastroparesis, cerebrovasc. Ds-  H/o stroke, PN),  Hypothyroidism, thyroid nodules.  Last visit 3 mo ago.  DM2: Last hemoglobin A1c was: Lab Results  Component Value Date   HGBA1C 6.2 (H) 05/31/2015   HGBA1C 6.4 (H) 02/21/2015   HGBA1C 6.2 12/22/2014  10/28/2013: HbA1c 8.5%  She is now on: - Toujeo 25 units at bedtime - Novolog:  10 units with a smaller meal >> 16 units  14 units with a larger meal or with lunch >> 30 units Could not tolerate Metformin >> nausea.   Pt checks her sugars 4x a day and they are - after last insulin dose adjustment at the beginning of the month:  - am: 98, 100-222, 310 >> 81-128, 166 >> 36, 41, 52-100, 122 >> 115-139, 158 >> 100-190, 201 >> 85-135, 140 - 2h after b'fast: n/c  - before lunch: 71-131, 170, 261 >> 36, 39, 65-117, 227 >> 129-154, 229 >> 44x1, 82-162, 201 >> 98-130, 148 - 2h after lunch: n/c >> 127-154, 229 - before dinner:  47-133, 243 >> 125, 212, 289 (may forget ins) >> 81-179, 287 >> 76, 89-110, 178 - 2h after dinner: n/c - bedtime:  101-193 >> 93, 160-218, 290, 398 >> 98-175 >> 54-150, 269 >> 64x1, 127-242 >> 57, 90-302 >> 100-160 - nighttime: n/c No lows. Lowest sugar was 69 >> 88x1 >> 84 x1 >> 36 >> 64x1 >> 44x1 >> 76; she has hypoglycemia awareness at 70.  Highest sugar was 200s >> 390s >> 289 >> 309 >> 332.  Pt's meals are: - Breakfast: eggs, cereals, fruit, oatmeal, bacon - Lunch: sandwich, beef, mashed potatoes,diet soda - Dinner: chicken, beef, fish, potatoes, vegetables - Snacks: 1-2: fruit She exercises 1x a day - walks.  - + mild CKD, last BUN/creatinine:  Lab Results  Component Value Date   BUN 23 07/19/2015   CREATININE 0.90 07/19/2015  On Losartan. -  last set of lipids: Lab Results  Component Value Date   CHOL 136 07/19/2015   HDL 44 (L) 07/19/2015   LDLCALC 66 07/19/2015   TRIG 131 07/19/2015   CHOLHDL 3.1 07/19/2015  On Ezetimibe + Crestor. - last eye exam was in 09/2015. No DR. Had cataract sx B. Now seeing Dr. Hassell Done [facility]). - + numbness and tingling in her leg L leg (affected by stroke) but also in R. Sees podiatry.  Hypothyroidism: - dx > 10 years ago. - On Levoxyl 50 mcg 6/7 days and 25 1/7 days.  - in am (at 7 am) - fasting - with water - >30 min b/f b'fast - no Ca - no Fe - + MVI at noon - + Aciphex moved at noon  Last TSH: Lab Results  Component Value Date   TSH 1.207 02/21/2015   TSH 1.352 11/09/2014   TSH 0.28 (L) 09/21/2014   TSH 1.10 04/10/2014   TSH 1.453 08/02/2011   She has a h/o thyroid nodules. + dysphagia, + hoarseness.  Reviewed thyroid U/S (10/2013):  Right thyroid lobe: 3.7 x 1.0 x 1.8 cm   Left thyroid lobe: 3.5 x 1.5 x 1.6 cm   Isthmus: 0.5 cm   Focal nodules: There is a 0.3 mm calcified nodule in the right midzone. There is a 0.6  x 0.7 x 0.5 cm hypoechoic nodule in the  lateral aspect of the right midzone. There is a 0.7 x 0.6 x 0.5 cm hyperechoic nodule in the lateral aspect of the right lower pole.  There is a 2.2 x 1.1 x 1.7 cm complex solid nodule in the inferior aspect of the isthmus extending adjacent to the left lower pole.  There is a 1.4 x 2.1 x 1.4 cm complex solid nodule in the left lower pole.   Lymphadenopathy: None visualized.  Repeat U/S (01/2014): stable appearance of nodules  FNA (2014) x2: benign  She had a L rib fracture in 12/2014.  ROS: Constitutional: no weight loss, no fatigue, + subjective hypothermia, + nocturia Eyes: + blurry vision, no xerophthalmia ENT: no sore throat, no nodules palpated in throat, no dysphagia/no odynophagia, no hoarseness Cardiovascular: no CP/+ SOB/no palpitations/leg swelling Respiratory: no cough/+ SOB Gastrointestinal: no  N/V/D/C Musculoskeletal: + muscle/no joint aches Skin: no rashes Neurological: no tremors/numbness/tingling/dizziness  I reviewed pt's medications, allergies, PMH, social hx, family hx, and changes were documented in the history of present illness. Otherwise, unchanged from my initial visit note - except stopped Remeron, started Risperdal: Past Medical History:  Diagnosis Date  . Anemia   . Anxiety    takes Ativan daily  . Arthritis   . Bipolar disorder ([place])    takes Risperdal nightly  . Blood transfusion   . Cancer ([place])    In her gum  . Carpal tunnel syndrome of right wrist 05/23/2011  . Cervical disc disorder with radiculopathy of cervical region 10/31/2012  . Chronic back pain   . Chronic neck pain   . COPD (chronic obstructive pulmonary disease) with chronic bronchitis ([place]) 09/16/2013   Office Spirometry 10/30/2013-submaximal effort based on appearance of loop and curve. Numbers would fit with severe restriction but her physiologic capability may be better than this. FVC 0.91/44%, and 10.74/45%, FEV1/FVC 0.81, FEF 25-75% 1.43/69%    . Depression    takes Zoloft daily  . Diabetes mellitus    Type II  . Diverticulosis    TCS 9/08 by Dr. Delfin Edis for diarrhea . Bx for micro scopic colitis negative.   . Frequent falls   . GERD (gastroesophageal reflux disease)    takes Aciphex daily  . Glaucoma    eye drops daily  . Gum symptoms    infection on antibiotic  . Hemiplegia affecting non-dominant side, post-stroke ([place]) 08/02/2011  . Hyperlipidemia    takes Crestor daily  . Hypertension    takes Amlodipine,Metoprolol,and Clonidine daily  . Hypothyroidism    takes Synthroid daily  . Insomnia    takes Trazodone nightly  . Metabolic encephalopathy 2/97/9892  . Migraines    chronic headaches  . Mononeuritis lower limb   . Osteoporosis   . Pancreatitis 2006   due to Depakote with normal EUS   . Schatzki's ring    non critical / EGD with ED 8/2011with RMR  . Seizures ([place])     takes Lamictal daily.Last seizure 3 yrs ago  . Sleep apnea    on CPAP  . Stroke [facility])    left sided weakness   Past Surgical History:  Procedure Laterality Date  . ABDOMINAL HYSTERECTOMY  1978  . BACK SURGERY  July 2012  . BACTERIAL OVERGROWTH TEST N/A 05/05/2013   Procedure: BACTERIAL OVERGROWTH TEST;  Surgeon: Daneil Dolin, MD;  [facility];  Service: Endoscopy;  Laterality: N/A;  7:30  . BIOPSY THYROID  2009  .  BRAIN SURGERY  11/2011   resection of meningioma  . BREAST REDUCTION SURGERY  1994  . CARDIAC CATHETERIZATION  05/10/2005   normal coronaries, normal LV systolic function and EF (Dr. Jackie Plum)  . CARPAL TUNNEL RELEASE Left 07/22/04   Dr. Aline Brochure  . CATARACT EXTRACTION Bilateral   . CHOLECYSTECTOMY  1984  . COLONOSCOPY N/A 09/25/2012   DDU:KGURKYH diverticulosis.  colonic polyp-removed : tubular adenoma  . CRANIOTOMY  11/23/2011   Procedure: CRANIOTOMY TUMOR EXCISION;  Surgeon: Hosie Spangle, MD;  Location: [facility];  Service: Neurosurgery;  Laterality: N/A;  Craniotomy for tumor resection  . ESOPHAGOGASTRODUODENOSCOPY  12/29/2010   Rourk-Retained food in the esophagus and stomach, small hiatal hernia, status post Maloney dilation of the esophagus  . ESOPHAGOGASTRODUODENOSCOPY N/A 09/25/2012   CWC:BJSEGBTD atonic baggy esophagus status post Maloney dilation 22 F. Hiatal hernia  . GIVENS CAPSULE STUDY N/A 01/15/2013   NORMAL.   [name] LESION REMOVAL N/A 05/31/2015   Procedure: REMOVAL RIGHT AND LEFT LESIONS OF MANDIBLE;  Surgeon: Diona Browner, DDS;  Location: [facility];  Service: Oral Surgery;  Laterality: N/A;  . MALONEY DILATION  12/29/2010   RMR;  . NM MYOCAR PERF WALL MOTION  2006   "relavtiely normal" persantine, mild anterior thinning (breast attenuation artifact), no region of scar/ischemia  . OVARIAN CYST REMOVAL    . RECTOCELE REPAIR N/A 06/29/2015   Procedure: POSTERIOR REPAIR (RECTOCELE);  Surgeon: Jonnie Kind, MD;  Location: [facility];  Service:  Gynecology;  Laterality: N/A;  . SPINE SURGERY  09/29/2010   Dr. Rolena Infante  . surgical excision of 3 tumors from right thigh and right buttock  and left upper thigh  2010  . TOOTH EXTRACTION Bilateral 12/14/2014   Procedure: REMOVAL OF BILATERAL MANDIBULAR EXOSTOSES;  Surgeon: Diona Browner, DDS;  Location: [facility];  Service: Oral Surgery;  Laterality: Bilateral;  . TRANSTHORACIC ECHOCARDIOGRAM  2010   EF 60-65%, mild conc LVH, grade 1 diastolic dysfunction; mildly calcified MV annulus with mildly thickened leaflets, mildly calcified MR annulus   History   Social History  . Marital Status: Divorced    Spouse Name: N/A    Number of Children: 1  . Years of Education: 12   Occupational History  . disabled     Social History Main Topics  . Smoking status: Current Every Day Smoker -- 0.25 packs/day for 7 years    Types: Cigarettes  . Smokeless tobacco: Never Used     Comment: "started back but off now for 3 months" (08/18/13)  . Alcohol Use: No     Comment:    . Drug Use: No   Current Outpatient Prescriptions on File Prior to Visit  Medication Sig Dispense Refill  . ACCU-CHEK FASTCLIX LANCETS MISC Use to test blood sugar 4 times daily. Dx: E10.65 408 each 1  . albuterol (PROVENTIL HFA;VENTOLIN HFA) 108 (90 BASE) MCG/ACT inhaler Inhale 1 puff into the lungs 3 (three) times daily. 1 Inhaler 2  . Alcohol Swabs (B-D SINGLE USE SWABS REGULAR) PADS Use for injections and glucose testing 8 times daily. Dx: E10.65 900 each 1  . amLODipine (NORVASC) 5 MG tablet Take 2 tablets (10 mg total) by mouth daily. 60 tablet 3  . aspirin EC 81 MG tablet Take 81 mg by mouth daily.    . Azelastine-Fluticasone (DYMISTA) 137-50 MCG/ACT SUSP Place 1 puff into the nose at bedtime. 1 Bottle 0  . BD PEN NEEDLE NANO U/F 32G X 4 MM MISC USE FOUR TIMES  DAILY 360 each 3  . Blood Glucose Calibration (ACCU-CHEK SMARTVIEW CONTROL) LIQD 1 each by Other route as needed. 1 each 2  . Blood Glucose Monitoring Suppl (ACCU-CHEK  NANO SMARTVIEW) W/DEVICE KIT 1 each by Does not apply route daily. Dx: E10.65 1 kit 0  . Cholecalciferol (VITAMIN D) 2000 units CAPS Take 1 capsule by mouth daily.    . ciprofloxacin (CIPRO) 500 MG tablet Take 1 tablet (500 mg total) by mouth 2 (two) times daily. 6 tablet 0  . clobetasol cream (TEMOVATE) 2.99 % Apply 1 application topically daily.    . cloNIDine (CATAPRES) 0.3 MG tablet TAKE 1 TABLET BY MOUTH EVERY EIGHT HOURS. ONCE AT 8AM, 4PM, AND 12 MIDNIGHT. 90 tablet 3  . conjugated estrogens (PREMARIN) vaginal cream Place 1 Applicatorful vaginally 2 (two) times a week. 30 g 1  . diclofenac sodium (VOLTAREN) 1 % GEL Apply 2 g topically daily as needed (Pain). 100 g 2  . dicyclomine (BENTYL) 10 MG capsule TAKE 1 CAPSULE BY MOUTH THREE TIMES DAILY BEFORE MEALS. 90 capsule 1  . glucose blood (ACCU-CHEK SMARTVIEW) test strip Use to test blood sugar 4 times daily as instructed. Dx: E10.65 375 each 1  . hydroxychloroquine (PLAQUENIL) 200 MG tablet Take 200 mg by mouth daily.    . insulin aspart (NOVOLOG FLEXPEN) 100 UNIT/ML FlexPen Inject 14-18 Units into the skin 3 (three) times daily with meals. 30 mL 1  . Insulin Glargine (TOUJEO SOLOSTAR) 300 UNIT/ML SOPN Inject 25 Units into the skin at bedtime. 6 pen 1  . lamoTRIgine (LAMICTAL) 100 MG tablet TAKE 1 TABLET BY MOUTH TWICE DAILY. 60 tablet 6  . levothyroxine (SYNTHROID, LEVOTHROID) 50 MCG tablet TAKE 1 TABLET BY MOUTH ONCE DAILY AND 1/2 TABLET ON SUNDAYS. 30 tablet 2  . LORazepam (ATIVAN) 0.5 MG tablet Take 1 tablet (0.5 mg total) by mouth 3 (three) times daily. 90 tablet 2  . LORazepam (ATIVAN) 0.5 MG tablet Take 1 tablet (0.5 mg total) by mouth 3 (three) times daily. 90 tablet 3  . losartan (COZAAR) 50 MG tablet Take 1 tablet (50 mg total) by mouth daily. 30 tablet 4  . Magnesium 250 MG TABS Take 1 tablet by mouth daily.    . meclizine (ANTIVERT) 12.5 MG tablet Take 1 tablet (12.5 mg total) by mouth 3 (three) times daily as needed for  dizziness. 15 tablet 0  . methocarbamol (ROBAXIN) 500 MG tablet Take 500 mg by mouth daily.    . metoprolol (LOPRESSOR) 50 MG tablet TAKE 1 TABLET BY MOUTH TWICE DAILY. 180 tablet 0  . montelukast (SINGULAIR) 10 MG tablet TAKE ONE TABLET BY MOUTH ONCE DAILY. 30 tablet 3  . Multiple Vitamins-Minerals (ONE-A-DAY 50 PLUS PO) Take 1 tablet by mouth daily.    [name] Nystatin (NYAMYC) 100000 UNIT/GM POWD APPLY TO AFFECTED AREA 4 TIMES DAILY. 30 g 2  . polyethylene glycol powder (MIRALAX) powder Take 17 g by mouth daily. To prevent constipation 255 g prn  . pregabalin (LYRICA) 75 MG capsule Take 75 mg by mouth 2 (two) times daily.     . RABEprazole (ACIPHEX) 20 MG tablet TAKE 1 TABLET BY MOUTH TWICE DAILY. 60 tablet 5  . RESTASIS 0.05 % ophthalmic emulsion Place 1 drop into both eyes 2 (two) times daily.     . risperiDONE (RISPERDAL) 0.5 MG tablet Take 1 tablet (0.5 mg total) by mouth at bedtime. 30 tablet 3  . rosuvastatin (CRESTOR) 5 MG tablet TAKE 1 TABLET BY MOUTH AT BEDTIME.  30 tablet 3  . sertraline (ZOLOFT) 100 MG tablet Take 2 tablets (200 mg total) by mouth daily. 60 tablet 3  . traZODone (DESYREL) 150 MG tablet Take 2 tablets (300 mg total) by mouth at bedtime. 60 tablet 3  . vitamin B-12 (CYANOCOBALAMIN) 1000 MCG tablet Take 1,000 mcg by mouth daily.    . vitamin E 400 UNIT capsule Take 400 Units by mouth daily.     No current facility-administered medications on file prior to visit.       Allergies  Allergen Reactions  . Cephalexin Hives  . Iron Nausea And Vomiting  . Milk-Related Compounds Other (See Comments)    Doesn't agree with stomach.   . Penicillins Hives    Has patient had a PCN reaction causing immediate rash, facial/tongue/throat swelling, SOB or lightheadedness with hypotension: Yes Has patient had a PCN reaction causing severe rash involving mucus membranes or skin necrosis: No Has patient had a PCN reaction that required hospitalization No Has patient had a PCN reaction  occurring within the last 10 years: No If all of the above answers are "NO", then may proceed with Cephalosporin use.   [name] Phenazopyridine Hcl Hives         Family History  Problem Relation Age of Onset  . Heart attack Mother     HTN  . Pneumonia Father   . Kidney failure Father   . Diabetes Father   . Pancreatic cancer Sister   . Diabetes Brother   . Hypertension Brother   . Diabetes Brother   . Cancer Sister     breast   . Hypertension Son   . Sleep apnea Son   . Cancer Sister     pancreatic  . Stroke Maternal Grandmother   . Heart attack Maternal Grandfather   . Alcohol abuse Maternal Uncle   . Colon cancer Neg Hx   . Anesthesia problems Neg Hx   . Hypotension Neg Hx   . Malignant hyperthermia Neg Hx   . Pseudochol deficiency Neg Hx    PE: LMP  (Exact Date)  There is no height or weight on file to calculate BMI.  Wt Readings from Last 3 Encounters:  10/25/15 162 lb (73.5 kg)  09/08/15 161 lb (73 kg)  09/01/15 160 lb (72.6 kg)   Constitutional: overweight, in NAD but again appears somnolent Eyes: surgical pupils, EOMI, no exophthalmos ENT: moist mucous membranes, no thyromegaly, large thyroid nodule felt in isthmus, no cervical lymphadenopathy Cardiovascular: RRR, No MRG Respiratory: CTA B Gastrointestinal: abdomen soft, NT, ND, BS+ Musculoskeletal: no deformities, strength intact in all 4 Skin: moist, warm, no rashes Neurological: no tremor with outstretched hands, DTR normal in all 4  ASSESSMENT: 1. DM2, insulin-dependent, uncontrolled, without complications - gastroparesis - cerebrovasc. Ds -  H/o stroke - PN  2. Hypothyroidism  3. MNG - Thyroid U/S (10/11/2012):  Right thyroid lobe: 3.7 x 1.0 x 1.8 cm  Left thyroid lobe: 3.5 x 1.5 x 1.6 cm  Isthmus: 0.5 cm  Focal nodules: There is a 0.3 mm calcified nodule in the right midzone. There is a 0.6 x 0.7 x 0.5 cm hypoechoic nodule in the lateral aspect of the right midzone. There is a 0.7 x 0.6  x 0.5 cm hyperechoic nodule in the lateral aspect of the right lower pole. There is a 2.2 x 1.1 x 1.7 cm complex solid nodule in the inferior aspect of the isthmus extending adjacent to the left lower pole. There is a 1.4 x  2.1 x 1.4 cm complex solid nodule in the left lower pole.  Lymphadenopathy: None visualized.  IMPRESSION: Multi nodular goiter which has markedly progressed since the prior exam. The dominant nodule at the inferior aspect of the isthmus fits criteria for fine needle aspiration biopsy if not previously Assessed.  - FNA (11/05/2012) x2: benign  - Thyroid U/S (01/20/2014) - felt one nodule enlarging >> new U/S: stable appearance of the nodules, except a small new nodule in isthmus: 1.2 cm   Right thyroid lobe Measurements: 4.4 cm x 1.2 cm x 1.7 cm. Multiple right-sided nodules identified. Each right-sided nodule demonstrates increased echogenicity, with the superior measuring 7 mm -8 mm, most inferior measuring 9 mm - 10 mm. A small, 3 mm focus of calcium with posterior shadowing is evident. There is also a mid nodule measuring 7 mm, which is echogenic.  Left thyroid lobe Measurements: 5.1 cm x 2.1 cm x 2.3 cm. Dominant lesion at the inferior pole of left thyroid is again evident, which has been previously biopsied (10/29/2012). This nodule measures 1.8 cm x 1.7cm x 2.5 cm. (Previous 1.4 cm x 2.1 cm x 1.4 cm)  Isthmus Thickness: 4 mm-5 mm. Isthmic nodule again noted, previously biopsied (10/29/2012). Currently this nodule measures 2.2 cm x 1.4 cm x 1.8 cm. (previous measurement 2.2 cm x 1.1 cm x 1.7 cm). There is a new nodule identified within the isthmus with heterogeneously hyperechoic characteristics. This nodule measures 12 mm x 5.4 mm x 7.2 mm.  Lymphadenopathy: None visualized.   PLAN:  1. Patient with long-standing, uncontrolled diabetes, on basal-bolus regimen, with few lows >> we have decreased her doses  >> Less lows after last decrease. Had 1 x 44  - does  not know what happened... Will not change regimen for now. - last HbA1c <7% - I advised her to:  Patient Instructions  Please continue: - Toujeo 25 units at bedtime - Novolog:  10 units with a smaller meal  14 units with a larger meal or with lunch  Please return in 3 months with your sugar log.   - continue checking sugars at different times of the day - check 3-4 times a day, rotating checks - advised for yearly eye exams >> she is UTD - HbA1c today: 6.6% (great!) - she is interested in a pump >> given her a brochure for a VGo to see if covered by insurance - Return to clinic in 1.5 mo with sugar log   2. Hypothyroidism - latest TFTs normal 8 mo ago - we discussed how to take the thyroid hormone every day, with water, >30 minutes before breakfast, separated by >4 hours from acid reflux medications, calcium, iron, multivitamins. She is taking the LT4 correctly. - continue LT4 50 mcg daily and on Sunday, 25 mcg. - check TFTs now.  3. MNG - previously had 2 normal Bx's (2014). Reviewed the ultrasounds from 2014 in 2015. - will need a new U/S in 2 years from previous (01/2016) >> will order a new one - she c/o dysphagia, but no external Es compression per Ba swallow from 08/2014  - she did have some Es dysmotility and is s/p multiple Es dilations  Orders Placed This Encounter  Procedures  . US Soft Tissue Head/Neck  . T4, free  . TSH  I will addend the results when they become available.  Pt did not stop at the lab... Will check at next visit  US Soft Tissue Head/Neck  Order: [id number]  Status:  Final result Visible to patient:  No (Not Released) Dx:  Multinodular goiter  Details   Reading Physician Reading Date Result Priority  Sandi Mariscal, MD 11/09/2015   Narrative    CLINICAL DATA: Follow-up thyroid nodules.  EXAM: THYROID ULTRASOUND  TECHNIQUE: Ultrasound examination of the thyroid gland and adjacent soft tissues was performed.  COMPARISON: Thyroid  ultrasound - 01/20/2014 ; 10/11/2012  FINDINGS: Parenchymal Echotexture: Moderately heterogenous  Estimated total number of nodules > 1 cm: <5  Number of spongiform nodules > 2 cm not described below (TR1): 0  Number of mixed cystic nodules > 1.5 cm not described below ([place]): 0  _________________________________________________________  Isthmus: 0.7 cm  Nodule # 1:  Prior biopsy: No  Location: Isthmus; left  Size: 1.5 x 1.3 x 1.5 cm, previously 2.2 x 1.4 x 1.8 cm  Composition: solid/almost completely solid (2)  Echogenicity: hyperechoic (1)  Shape: not taller-than-wide (0)  Margins: ill-defined (0)  Echogenic foci: none (0)  ACR TI-RADS total points: 3.  ACR TI-RADS risk category: TR3 (3 points).  Change in features: Yes. Increased internal cystic degeneration and smaller in size.  Change in ACR TI-RADS risk category: No  ACR TI-RADS recommendations:  *Given size (1.5 - 2.4 cm) and appearance, a follow-up ultrasound in 1 year is recommended based on TI-RADS criteria as clinically indicated.  _________________________________________________________  Right lobe: 4.6 x 1.1 x 1.9 cm, previously 4.4 x 1.2 x 1.7 cm  Stable scattered subcentimeter echogenic nodules  _________________________________________________________  Left lobe: 4.8 x 2.1 x 1.9 cm, previously 5.1 x 2.1 x 2.3 cm  Nodule # 1:  Prior biopsy: No  Location: Left; Inferior  Size: 2.4 x 1.7 x 1.9 cm, previously 2.5 x 1.7 x 1.7  Composition: solid/almost completely solid (2)  Echogenicity: hyperechoic (1)  Shape: not taller-than-wide (0)  Margins: ill-defined (0)  Echogenic foci: none (0)  ACR TI-RADS total points: 3.  ACR TI-RADS risk category: TR3 (3 points).  Change in features: No  Change in ACR TI-RADS risk category: No  ACR TI-RADS recommendations:  *Given size (1.5 - 2.4 cm) and appearance, a follow-up ultrasound in 1 year is recommended based on TI-RADS criteria  as clinically indicated.  IMPRESSION: TR 3 isthmic and left thyroid nodules unchanged. *Given size (1.5 - 2.4 cm) and appearance of both thyroid nodules, a follow-up ultrasound in 1 year is recommended based on TI-RADS criteria as clinically indicated.  The above is in keeping with the ACR TI-RADS recommendations - J Am Coll Radiol 2017;14:587-595.   Electronically Signed By: Sandi Mariscal M.D. On: 11/09/2015 19:10       None of the nodules have enlarged or show concerning features. Since they have been biopsied before, will continue following them clinically and by ultrasound.   Philemon Kingdom, MD PhD [facility] Endocrinology

## 2015-11-01 ENCOUNTER — Ambulatory Visit (INDEPENDENT_AMBULATORY_CARE_PROVIDER_SITE_OTHER): Payer: Commercial Managed Care - HMO | Admitting: Adult Health

## 2015-11-01 ENCOUNTER — Telehealth: Payer: Self-pay

## 2015-11-01 ENCOUNTER — Telehealth: Payer: Self-pay | Admitting: Internal Medicine

## 2015-11-01 ENCOUNTER — Encounter: Payer: Self-pay | Admitting: Adult Health

## 2015-11-01 VITALS — BP 106/58 | HR 57 | Ht 61.0 in | Wt 160.0 lb

## 2015-11-01 DIAGNOSIS — I10 Essential (primary) hypertension: Secondary | ICD-10-CM

## 2015-11-01 DIAGNOSIS — F419 Anxiety disorder, unspecified: Secondary | ICD-10-CM

## 2015-11-01 DIAGNOSIS — I639 Cerebral infarction, unspecified: Secondary | ICD-10-CM | POA: Diagnosis not present

## 2015-11-01 DIAGNOSIS — J440 Chronic obstructive pulmonary disease with acute lower respiratory infection: Secondary | ICD-10-CM | POA: Diagnosis not present

## 2015-11-01 DIAGNOSIS — R32 Unspecified urinary incontinence: Secondary | ICD-10-CM | POA: Diagnosis not present

## 2015-11-01 DIAGNOSIS — M150 Primary generalized (osteo)arthritis: Secondary | ICD-10-CM | POA: Diagnosis not present

## 2015-11-01 DIAGNOSIS — R0789 Other chest pain: Secondary | ICD-10-CM

## 2015-11-01 LAB — POCT GLYCOSYLATED HEMOGLOBIN (HGB A1C): Hemoglobin A1C: 6.6

## 2015-11-01 NOTE — Patient Instructions (Signed)
Your physician wants you to follow-up in: 1 Year.  You will receive a reminder letter in the mail two months in advance. If you don't receive a letter, please call our office to schedule the follow-up appointment.  Your physician recommends that you continue on your current medications as directed. Please refer to the Current Medication list given to you today.  If you need a refill on your cardiac medications before your next appointment, please call your pharmacy.  Thank you for choosing [facility]!   

## 2015-11-01 NOTE — Progress Notes (Signed)
Name: [name]    DOB: 04-Jul-1950  Age: 65 y.o.  MR#: [id number]       PCP:  Tula Nakayama, MD      Insurance: Payor: [employer] Rossetti MEDICARE / Plan: [place] THN/NTSP / Product Type: *No Product type* /   CC:   No chief complaint on file.   VS Vitals:   11/01/15 1444  Pulse: (!) 57  SpO2: 97%  Weight: 160 lb (72.6 kg)  Height: 5' 1"  (1.549 m)    Weights Current Weight  11/01/15 160 lb (72.6 kg)  10/29/15 160 lb (72.6 kg)  10/25/15 162 lb (73.5 kg)    Blood Pressure  BP Readings from Last 3 Encounters:  10/29/15 (!) 92/58  10/25/15 (!) 178/80  09/08/15 (!) 134/56     Admit date:  (Not on file) Last encounter with RMR:  08/17/2015   Allergy Cephalexin; Iron; Milk-related compounds; Penicillins; and Phenazopyridine hcl  Current Outpatient Prescriptions  Medication Sig Dispense Refill  . ACCU-CHEK FASTCLIX LANCETS MISC Use to test blood sugar 4 times daily. Dx: E10.65 408 each 1  . albuterol (PROVENTIL HFA;VENTOLIN HFA) 108 (90 BASE) MCG/ACT inhaler Inhale 1 puff into the lungs 3 (three) times daily. 1 Inhaler 2  . Alcohol Swabs (B-D SINGLE USE SWABS REGULAR) PADS Use for injections and glucose testing 8 times daily. Dx: E10.65 900 each 1  . amLODipine (NORVASC) 5 MG tablet Take 2 tablets (10 mg total) by mouth daily. 60 tablet 3  . aspirin EC 81 MG tablet Take 81 mg by mouth daily.    . Azelastine-Fluticasone (DYMISTA) 137-50 MCG/ACT SUSP Place 1 puff into the nose at bedtime. 1 Bottle 0  . BD PEN NEEDLE NANO U/F 32G X 4 MM MISC USE FOUR TIMES DAILY 360 each 3  . Blood Glucose Calibration (ACCU-CHEK SMARTVIEW CONTROL) LIQD 1 each by Other route as needed. 1 each 2  . Blood Glucose Monitoring Suppl (ACCU-CHEK NANO SMARTVIEW) W/DEVICE KIT 1 each by Does not apply route daily. Dx: E10.65 1 kit 0  . Cholecalciferol (VITAMIN D) 2000 units CAPS Take 1 capsule by mouth daily.    . ciprofloxacin (CIPRO) 500 MG tablet Take 1 tablet (500 mg total) by mouth 2 (two) times  daily. 6 tablet 0  . clobetasol cream (TEMOVATE) 5.03 % Apply 1 application topically daily.    . cloNIDine (CATAPRES) 0.3 MG tablet TAKE 1 TABLET BY MOUTH EVERY EIGHT HOURS. ONCE AT 8AM, 4PM, AND 12 MIDNIGHT. 90 tablet 3  . diclofenac sodium (VOLTAREN) 1 % GEL Apply 2 g topically daily as needed (Pain). 100 g 2  . dicyclomine (BENTYL) 10 MG capsule TAKE 1 CAPSULE BY MOUTH THREE TIMES DAILY BEFORE MEALS. 90 capsule 1  . glucose blood (ACCU-CHEK SMARTVIEW) test strip Use to test blood sugar 4 times daily as instructed. Dx: E10.65 375 each 1  . hydroxychloroquine (PLAQUENIL) 200 MG tablet Take 200 mg by mouth daily.    . insulin aspart (NOVOLOG FLEXPEN) 100 UNIT/ML FlexPen Inject 16-30 Units into the skin 3 (three) times daily with meals. 30 mL 2  . Insulin Glargine (TOUJEO SOLOSTAR) 300 UNIT/ML SOPN Inject 25 Units into the skin at bedtime. 6 pen 1  . lamoTRIgine (LAMICTAL) 100 MG tablet TAKE 1 TABLET BY MOUTH TWICE DAILY. 60 tablet 6  . levothyroxine (SYNTHROID, LEVOTHROID) 50 MCG tablet TAKE 1 TABLET BY MOUTH ONCE DAILY AND 1/2 TABLET ON SUNDAYS. 30 tablet 2  . LORazepam (ATIVAN) 0.5 MG tablet Take 1  tablet (0.5 mg total) by mouth 3 (three) times daily. 90 tablet 2  . LORazepam (ATIVAN) 0.5 MG tablet Take 1 tablet (0.5 mg total) by mouth 3 (three) times daily. 90 tablet 3  . losartan (COZAAR) 50 MG tablet Take 1 tablet (50 mg total) by mouth daily. 30 tablet 4  . methocarbamol (ROBAXIN) 500 MG tablet Take 500 mg by mouth daily.    . metoprolol (LOPRESSOR) 50 MG tablet TAKE 1 TABLET BY MOUTH TWICE DAILY. 180 tablet 0  . montelukast (SINGULAIR) 10 MG tablet TAKE ONE TABLET BY MOUTH ONCE DAILY. 30 tablet 3  . Multiple Vitamins-Minerals (ONE-A-DAY 50 PLUS PO) Take 1 tablet by mouth daily.    [name] Nystatin (NYAMYC) 100000 UNIT/GM POWD APPLY TO AFFECTED AREA 4 TIMES DAILY. 30 g 2  . polyethylene glycol powder (MIRALAX) powder Take 17 g by mouth daily. To prevent constipation 255 g prn  . pregabalin  (LYRICA) 75 MG capsule Take 75 mg by mouth 2 (two) times daily.     . RABEprazole (ACIPHEX) 20 MG tablet TAKE 1 TABLET BY MOUTH TWICE DAILY. 60 tablet 5  . RESTASIS 0.05 % ophthalmic emulsion Place 1 drop into both eyes 2 (two) times daily.     . risperiDONE (RISPERDAL) 0.5 MG tablet Take 1 tablet (0.5 mg total) by mouth at bedtime. 30 tablet 3  . rosuvastatin (CRESTOR) 5 MG tablet TAKE 1 TABLET BY MOUTH AT BEDTIME. 30 tablet 3  . sertraline (ZOLOFT) 100 MG tablet Take 2 tablets (200 mg total) by mouth daily. 60 tablet 3  . traZODone (DESYREL) 150 MG tablet Take 2 tablets (300 mg total) by mouth at bedtime. 60 tablet 3  . vitamin B-12 (CYANOCOBALAMIN) 1000 MCG tablet Take 1,000 mcg by mouth daily.    . vitamin E 400 UNIT capsule Take 400 Units by mouth daily.     No current facility-administered medications for this visit.     Discontinued Meds:    Medications Discontinued During This Encounter  Medication Reason  . conjugated estrogens (PREMARIN) vaginal cream Error  . meclizine (ANTIVERT) 12.5 MG tablet Error  . Magnesium 250 MG TABS Error    Patient Active Problem List   Diagnosis Date Noted  . History of palpitations 08/09/2015  . Nausea without vomiting 08/09/2015  . Labile hypertension 08/03/2015  . Normal coronary arteries 08/03/2015  . Vertigo 07/15/2015  . Rectocele 06/29/2015  . Left-sided low back pain with left-sided sciatica 06/27/2015  . Left flank pain 06/27/2015  . Acute cystitis without hematuria 05/10/2015  . Poorly controlled type 2 diabetes mellitus with circulatory disorder ([place]) 05/06/2015  . Multinodular goiter 05/06/2015  . Rectocele, female 04/27/2015  . Anal sphincter incontinence 04/27/2015  . Pelvic relaxation due to rectocele 03/30/2015  . Pulmonary hypertension ([place]) 02/22/2015  . Weakness of left upper extremity 02/21/2015  . Light cigarette smoker (1-9 cigarettes per day) 01/11/2015  . Migraine without aura and without status migrainosus, not  intractable 07/02/2014  . Flatulence 02/18/2014  . Microcytic anemia 02/18/2014  . COPD (chronic obstructive pulmonary disease) with chronic bronchitis ([place]) 09/16/2013  . Hypothyroidism 08/16/2013  . Gastroparesis 04/28/2013  . Seizure disorder ([place]) 01/19/2013  . Cervical disc disorder with radiculopathy of cervical region 10/31/2012  . Solitary pulmonary nodule 08/19/2012  . Anemia 07/05/2012  . 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 Allergic sinusitis 04/18/2012  . Meningioma ([place]) 11/19/2011  . 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 Hemiplegia affecting non-dominant side, post-stroke ([place]) 08/02/2011  .  Carpal tunnel syndrome of right wrist 05/23/2011  . Polypharmacy 04/28/2011  . Bipolar disorder (HCC) 04/28/2011  . Constipation 04/13/2011  . Falls frequently 12/12/2010  . Urinary incontinence 12/16/2009  . HEARING LOSS 10/26/2009  . Hyperlipidemia 12/11/2008  . IBS 12/11/2008  . GERD 07/29/2008  . MILK PRODUCTS ALLERGY 07/29/2008  . 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 Backache 06/19/2007  . Osteoporosis 06/19/2007  . Obstructive sleep apnea 06/19/2007  . TRIGGER FINGER 04/18/2007  . DIVERTICULOSIS, COLON 11/13/2006    LABS    Component Value Date/Time   NA 141 07/19/2015 0925   NA 143 06/30/2015 0553   NA 142 06/23/2015 1145   K 4.0 07/19/2015 0925   K 4.0 06/30/2015 0553   K 3.8 06/23/2015 1145   CL 105 07/19/2015 0925   CL 113 (H) 06/30/2015 0553   CL 110 06/23/2015 1145   CO2 28 07/19/2015 0925   CO2 27 06/30/2015 0553   CO2 25 06/23/2015 1145   GLUCOSE 298 (H) 07/19/2015 0925   GLUCOSE 51 (L) 06/30/2015 0553   GLUCOSE 51 (L) 06/23/2015 1145   BUN 23 07/19/2015 0925   BUN 23 (H) 06/30/2015 0553   BUN 25 (H) 06/23/2015 1145   CREATININE 0.90 07/19/2015 0925   CREATININE 0.87 06/30/2015 0553   CREATININE 0.94 06/23/2015 1145   CREATININE 0.90 05/31/2015 0630   CREATININE 1.23 (H)  03/02/2015 0930   CREATININE 1.12 (H) 12/30/2014 1219   CALCIUM 8.8 07/19/2015 0925   CALCIUM 8.4 (L) 06/30/2015 0553   CALCIUM 9.0 06/23/2015 1145   GFRNONAA 67 07/19/2015 0925   GFRNONAA >60 06/30/2015 0553   GFRNONAA >60 06/23/2015 1145   GFRNONAA >60 05/31/2015 0630   GFRNONAA 46 (L) 03/02/2015 0930   GFRNONAA 52 (L) 12/30/2014 1219   GFRAA 78 07/19/2015 0925   GFRAA >60 06/30/2015 0553   GFRAA >60 06/23/2015 1145   GFRAA >60 05/31/2015 0630   GFRAA 54 (L) 03/02/2015 0930   GFRAA 60 12/30/2014 1219   CMP     Component Value Date/Time   NA 141 07/19/2015 0925   K 4.0 07/19/2015 0925   CL 105 07/19/2015 0925   CO2 28 07/19/2015 0925   GLUCOSE 298 (H) 07/19/2015 0925   BUN 23 07/19/2015 0925   CREATININE 0.90 07/19/2015 0925   CALCIUM 8.8 07/19/2015 0925   PROT 6.1 07/19/2015 0925   ALBUMIN 3.6 07/19/2015 0925   AST 13 07/19/2015 0925   ALT 12 07/19/2015 0925   ALKPHOS 146 (H) 07/19/2015 0925   BILITOT 0.2 07/19/2015 0925   GFRNONAA 67 07/19/2015 0925   GFRAA 78 07/19/2015 0925       Component Value Date/Time   WBC 7.1 06/30/2015 0553   WBC 6.2 06/23/2015 1145   WBC 6.8 05/31/2015 0630   HGB 9.6 (L) 06/30/2015 0553   HGB 10.8 (L) 06/23/2015 1145   HGB 11.0 (L) 05/31/2015 0630   HCT 31.1 (L) 06/30/2015 0553   HCT 35.4 (L) 06/23/2015 1145   HCT 36.5 05/31/2015 0630   MCV 74.8 (L) 06/30/2015 0553   MCV 75.0 (L) 06/23/2015 1145   MCV 75.1 (L) 05/31/2015 0630    Lipid Panel     Component Value Date/Time   CHOL 136 07/19/2015 0925   TRIG 131 07/19/2015 0925   HDL 44 (L) 07/19/2015 0925   CHOLHDL 3.1 07/19/2015 0925   VLDL 26 07/19/2015 0925   LDLCALC 66 07/19/2015 0925    ABG    Component  Value Date/Time   PHART 7.308 (L) 02/15/2012 1024   PCO2ART 51.9 (H) 02/15/2012 1024   PO2ART 67.5 (L) 02/15/2012 1024   HCO3 25.3 (H) 02/15/2012 1024   TCO2 24 02/21/2015 1044   ACIDBASEDEF 0.2 02/15/2012 1024   O2SAT 91.4 02/15/2012 1024     Lab Results   Component Value Date   TSH 1.207 02/21/2015   BNP (last 3 results) No results for input(s): BNP in the last 8760 hours.  ProBNP (last 3 results) No results for input(s): PROBNP in the last 8760 hours.  Cardiac Panel (last 3 results) No results for input(s): CKTOTAL, CKMB, TROPONINI, RELINDX in the last 72 hours.  Iron/TIBC/Ferritin/ %Sat    Component Value Date/Time   IRON 44 07/15/2014 1233   TIBC 343 07/15/2014 1233   FERRITIN 76 07/15/2014 1233   IRONPCTSAT 13 (L) 07/15/2014 1233     EKG Orders placed or performed in visit on 08/03/15  . EKG 12-Lead   *Note: Due to a large number of results and/or encounters for the requested time period, some results have not been displayed. A complete set of results can be found in Results Review.     Prior Assessment and Plan Problem List as of 11/01/2015 Reviewed: 10/29/2015  3:07 PM by Philemon Kingdom, MD     Cardiovascular and Mediastinum   Migraine without aura and without status migrainosus, not intractable   Last Assessment & Plan 10/20/2014 Office Visit Written 12/13/2014  8:01 AM by [name], MD    No recent flare adequately controlled      Pulmonary hypertension ([place])   Poorly controlled type 2 diabetes mellitus with circulatory disorder [facility])   Last Assessment & Plan 10/25/2015 Office Visit Written 10/25/2015 10:11 PM by [name], MD    Reports poor control, however , states with recent dose increase there is improvement Ms. Fullman is reminded of the importance of commitment to daily physical activity for 30 minutes or more, as able and the need to limit carbohydrate intake to 30 to 60 grams per meal to help with blood sugar control.   The need to take medication as prescribed, test blood sugar as directed, and to call between visits if there is a concern that blood sugar is uncontrolled is also discussed.   Ms. Ramaker is reminded of the importance of daily foot exam, annual eye examination, and good blood  sugar, blood pressure and cholesterol control.  Diabetic Labs Latest Ref Rng & Units 07/19/2015 06/30/2015 06/23/2015 05/31/2015 05/01/2015  HbA1c 4.8 - 5.6 % - - - 6.2(H) -  Microalbumin <2.0 mg/dL - - - - -  Micro/Creat Ratio 0.0 - 30.0 mg/g - - - - -  Chol 125 - 200 mg/dL 136 - - - -  HDL >=46 mg/dL 44(L) - - - -  Calc LDL <130 mg/dL 66 - - - -  Triglycerides <150 mg/dL 131 - - - -  Creatinine 0.50 - 0.99 mg/dL 0.90 0.87 0.94 0.90 1.02(H)  GFR >60.00 mL/min - - - - -   BP/Weight 10/25/2015 09/08/2015 09/01/2015 08/31/2015 08/17/2015 08/11/2015 9/37/9024  Systolic BP 097 353 299 242 683 419 622  Diastolic BP 80 56 72 60 60 64 60  Wt. (Lbs) 162 161 160 159 160 154 158  BMI 31.64 31.44 31.25 30.06 30.25 29.11 29.87  Some encounter information is confidential and restricted. Go to Review Flowsheets activity to see all data.   Foot/eye exam completion dates Latest Ref Rng & Units 08/11/2015  10/06/2014  Eye Exam No Retinopathy - No Retinopathy  Foot exam Order - - -  Foot Form Completion - Done -             Labile hypertension   Last Assessment & Plan 10/25/2015 Office Visit Written 10/25/2015 10:08 PM by [name], MD    Elevated SBP, cozaar dose increased  DASH diet and commitment to daily physical activity for a minimum of 30 minutes discussed and encouraged, as a part of hypertension management. The importance of attaining a healthy weight is also discussed.  BP/Weight 10/25/2015 09/08/2015 09/01/2015 08/31/2015 08/17/2015 08/11/2015 1/61/0960  Systolic BP 454 098 119 147 829 562 130  Diastolic BP 80 56 72 60 60 64 60  Wt. (Lbs) 162 161 160 159 160 154 158  BMI 31.64 31.44 31.25 30.06 30.25 29.11 29.87  Some encounter information is confidential and restricted. Go to Review Flowsheets activity to see all data.             Respiratory   Obstructive sleep apnea   Last Assessment & Plan 08/03/2015 Office Visit Written 08/03/2015 11:57 AM by Erlene Quan, PA-C    She has had  spotty compliance with C-pap      Allergic sinusitis   Last Assessment & Plan 04/22/2014 Office Visit Written 04/27/2014  3:24 AM by [name], MD    Controlled, no change in medication Takes daily singulair       COPD (chronic obstructive pulmonary disease) with chronic bronchitis [facility])   Last Assessment & Plan 07/27/2015 Office Visit Written 07/27/2015  3:50 PM by Deneise Lever, MD    Better effort on spirometry at this visit compared with 2015. I still suspect submaximal numbers due to effort.        Digestive   GERD   Last Assessment & Plan 10/25/2015 Office Visit Written 10/25/2015 10:11 PM by [name], MD    Currently controlled but wants to re establish with previous Provider, referral enetered      DIVERTICULOSIS, COLON   IBS   Last Assessment & Plan 10/25/2015 Office Visit Written 10/25/2015 10:13 PM by [name], MD    Chronic, no current flare , gI referral toProvider of her choice , requests return to prior GI Doc      Constipation   Last Assessment & Plan 08/20/2014 Office Visit Edited 08/23/2014  9:54 PM by Mahala Menghini, PA-C    Hold bentyl to see if bowel function normalizes. Use narcotics sparingly. Return to the office in eight weeks.       Gastroparesis   Last Assessment & Plan 08/20/2014 Office Visit Written 08/23/2014  9:58 PM by Mahala Menghini, PA-C    Encouraged tight glycemic control and minimize narcotics. Antiemetics as needed. Patient request phenergan as opposed to zofran reported better efficacy. Low dose phenergan provided. Return to the office in 8 weeks.       Pelvic relaxation due to rectocele   Rectocele, female   Last Assessment & Plan 05/10/2015 Office Visit Written 05/10/2015  1:06 PM by [name], MD    Repair planned for March, 2017      Rectocele     Endocrine   Hypothyroidism   Last Assessment & Plan 10/25/2015 Office Visit Written 10/25/2015 10:12 PM by [name], MD    Controlled, no change in  medication Managed by endo      Multinodular goiter     Nervous and Auditory  Hemiplegia affecting non-dominant side, post-stroke [facility])   Last Assessment & Plan 03/02/2015 Office Visit Written 04/27/2015  6:04 AM by [name], MD    Out patient therapy arranged while in hospital for improved strengthening      HEARING LOSS   Carpal tunnel syndrome of right wrist   Mononeuritis leg   Meningioma [facility])   Last Assessment & Plan 11/16/2011 Office Visit Written 11/19/2011  7:43 AM by [name], MD    Upcoming surgery in the next 1 week planned      Seizure disorder [facility])   Last Assessment & Plan 12/30/2014 Office Visit Written 01/30/2015  8:41 PM by [name], MD    Controlled, no change in medication         Musculoskeletal and Integument   TRIGGER FINGER   Osteoporosis   Cervical disc disorder with radiculopathy of cervical region   Last Assessment & Plan 04/22/2014 Office Visit Written 04/27/2014  3:28 AM by [name], MD    Chronic pain management through pain center        Genitourinary   Acute cystitis without hematuria   Last Assessment & Plan 10/25/2015 Office Visit Edited 10/25/2015 10:09 PM by [name], MD    4 day history of symptoms with abnormal UA, antibioitic for 3 days and check c/s        Other   Hyperlipidemia   Last Assessment & Plan 08/03/2015 Office Visit Written 08/03/2015 11:58 AM by Erlene Quan, PA-C    LDL 66 May 2017, PCP follows      Polypharmacy   Last Assessment & Plan 08/03/2015 Office Visit Written 08/03/2015 11:55 AM by Erlene Quan, PA-C    Multiple medications for multiple medical and psychiatric issues- ? 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   Last Assessment & Plan  09/16/2013 Office Visit Written 10/14/2013 10:49 PM by [name], MD    Stable and treated by mental health      Backache   Last Assessment & Plan 10/25/2015 Office Visit Written 10/25/2015 10:13 PM by [name], MD    Chronic , has had back surgery, followed by pain clinic , reports increased and uncontrolled pain, no recent trauma , defer to pain mx      Urinary incontinence   Last Assessment & Plan 03/02/2015 Office Visit Written 04/27/2015  6:13 AM by [name], MD    Uncontrolled and problematic, start medication and pt ed provided      MILK PRODUCTS ALLERGY   Falls frequently   Last Assessment & Plan 12/30/2014 Office Visit Written 01/30/2015  8:41 PM by [name], MD    New rib FRACTURE AND KNEE TRAUMA FOLLOWING RECENT FALL. HOME SAFETY REVIEWED AT VISIT      Hypersomnia disorder related to a known organic factor   Last Assessment & Plan 12/26/2013 Office Visit Written 12/26/2013  8:56 PM by Deneise Lever, MD    She seems to be functioning better today      Anemia   Last Assessment & Plan 07/04/2012 Office Visit Written 07/05/2012  8:51 PM by Orvil Feil, NP    Chronic, with drop in Hgb from 12  to 9 range recently. Question if true result, as she tends to remain in the 9-10 range. No overt signs of GI bleeding, EGD fairly recent but last colonoscopy in 2011.  Repeat CBC Add ferritin, iron Consider ifobt, updated colonoscopy       Solitary pulmonary nodule   Last Assessment & Plan 12/26/2013 Office Visit Written 12/26/2013  8:55 PM by Deneise Lever, MD    CXR not showing a progressive process      Flatulence   Microcytic anemia   Light cigarette smoker (1-9 cigarettes per day)   Last Assessment & Plan 10/25/2015 Office Visit Written 10/25/2015 10:11 PM by [name], MD    Patient counseled for approximately 5 minutes regarding the health risks of ongoing nicotine use, specifically all types of cancer, heart disease, stroke and  respiratory failure. The options available for help with cessation ,the behavioral changes to assist the process, and the option to either gradully reduce usage  Or abruptly stop.is also discussed. Pt is also encouraged to set specific goals in number of cigarettes used daily, as well as to set a quit date.         Weakness of left upper extremity   Anal sphincter incontinence   Left-sided low back pain with left-sided sciatica   Last Assessment & Plan 05/19/2015 Office Visit Written 06/27/2015 11:59 PM by [name], MD    hUncontrolled.Toradol and depo medrol administered IM in the office , Return to pain management, referral entered, short course of hydrocodone prescribed and she is not to take any other pain med wit  this      Left flank pain   Last Assessment & Plan 05/19/2015 Office Visit Written 06/28/2015 12:01 AM by [name], MD    Left flank pain with hypertension, will order renal US, also stool tested for hidden blood and is negative In house UA negative for blood, likely referred pain from back, will f/u  Renal US      Vertigo   Last Assessment & Plan 07/15/2015 Office Visit Written 08/09/2015  1:31 PM by [name], MD    Currently symptomatic, o nystagmus, antivert prescribed for short term, as needed, use      Normal coronary arteries   Last Assessment & Plan 08/03/2015 Office Visit Written 08/03/2015 12:10 PM by Erlene Quan, PA-C    2007 after an abnormal Cardiolite      History of palpitations   Last Assessment & Plan 07/15/2015 Office Visit Written 08/09/2015  1:27 PM by [name], MD    Office EKG done at visit on 07/15/2015 showed NSR, no ischemia      Nausea without vomiting   Last Assessment & Plan 07/15/2015 Office Visit Written 08/09/2015  1:34 PM by [name], MD    zofran administered in office as currently symptomatic          Imaging: Nm Myocar Multi W/spect W/wall Motion / Ef  Result Date: 10/28/2015  The study  is normal.  This is a low risk study.  The left ventricular ejection fraction is hyperdynamic (>65%).  There was no ST segment deviation noted during stress.

## 2015-11-01 NOTE — Progress Notes (Signed)
Cardiology Office Note   Date:  11/01/2015   ID:  [name], DOB [birth year], MRN [id number]  PCP:  [name] Nakayama, MD  Cardiologist: To Be Established/  [name] Sims, NP   Chief Complaint  Patient presents with  . Hypertension      History of Present Illness: [name] is a 65 y.o. female who presents for ongoing assessment and management of hypertension, history of abnormal Cardiolite stress test followed by cardiac catheterization in 2007 revealing normal coronary anatomy. Most recent echocardiogram was completed in 2016 revealing normal LV function, mild LVH, moderate pulmonary hypertension. On last offisit on 08/17/2015 she came with multiple somatic complaints.   The patient requested a repeat stress test to evaluate for coronary artery disease in the setting of recurrent chest pain. This was scheduled for the end of August 2017.stress test results reveal a low risk study. The left ventricular ejection fraction was hyperdynamic (greater than 65%) there were noted during stress.    Past Medical History:  Diagnosis Date  . Anemia   . Anxiety    takes Ativan daily  . Arthritis   . Bipolar disorder ([place])    takes Risperdal nightly  . Blood transfusion   . Cancer ([place])    In her gum  . Carpal tunnel syndrome of right wrist 05/23/2011  . Cervical disc disorder with radiculopathy of cervical region 10/31/2012  . Chronic back pain   . Chronic neck pain   . COPD (chronic obstructive pulmonary disease) with chronic bronchitis ([place]) 09/16/2013   Office Spirometry 10/30/2013-submaximal effort based on appearance of loop and curve. Numbers would fit with severe restriction but her physiologic capability may be better than this. FVC 0.91/44%, and 10.74/45%, FEV1/FVC 0.81, FEF 25-75% 1.43/69%    . Depression    takes Zoloft daily  . Diabetes mellitus    Type II  . Diverticulosis    TCS 9/08 by Dr. Delfin Edis for diarrhea . Bx for micro scopic colitis negative.   . Frequent  falls   . GERD (gastroesophageal reflux disease)    takes Aciphex daily  . Glaucoma    eye drops daily  . Gum symptoms    infection on antibiotic  . Hemiplegia affecting non-dominant side, post-stroke ([place]) 08/02/2011  . Hyperlipidemia    takes Crestor daily  . Hypertension    takes Amlodipine,Metoprolol,and Clonidine daily  . Hypothyroidism    takes Synthroid daily  . Insomnia    takes Trazodone nightly  . Metabolic encephalopathy 6/76/1950  . Migraines    chronic headaches  . Mononeuritis lower limb   . Osteoporosis   . Pancreatitis 2006   due to Depakote with normal EUS   . Schatzki's ring    non critical / EGD with ED 8/2011with RMR  . Seizures ([place])    takes Lamictal daily.Last seizure 3 yrs ago  . Sleep apnea    on CPAP  . Stroke [facility])    left sided weakness    Past Surgical History:  Procedure Laterality Date  . ABDOMINAL HYSTERECTOMY  1978  . BACK SURGERY  July 2012  . BACTERIAL OVERGROWTH TEST N/A 05/05/2013   Procedure: BACTERIAL OVERGROWTH TEST;  Surgeon: Daneil Dolin, MD;  [facility];  Service: Endoscopy;  Laterality: N/A;  7:30  . BIOPSY THYROID  2009  . BRAIN SURGERY  11/2011   resection of meningioma  . BREAST REDUCTION SURGERY  1994  . CARDIAC CATHETERIZATION  05/10/2005   normal coronaries, normal LV  systolic function and EF (Dr. Jackie Plum)  . CARPAL TUNNEL RELEASE Left 07/22/04   Dr. Aline Brochure  . CATARACT EXTRACTION Bilateral   . CHOLECYSTECTOMY  1984  . COLONOSCOPY N/A 09/25/2012   [id number] diverticulosis.  colonic polyp-removed : tubular adenoma  . CRANIOTOMY  11/23/2011   Procedure: CRANIOTOMY TUMOR EXCISION;  Surgeon: Hosie Spangle, MD;  Location: [facility];  Service: Neurosurgery;  Laterality: N/A;  Craniotomy for tumor resection  . ESOPHAGOGASTRODUODENOSCOPY  12/29/2010   Rourk-Retained food in the esophagus and stomach, small hiatal hernia, status post Maloney dilation of the esophagus  . ESOPHAGOGASTRODUODENOSCOPY N/A  09/25/2012   [id number] atonic baggy esophagus status post Maloney dilation 90 F. Hiatal hernia  . GIVENS CAPSULE STUDY N/A 01/15/2013   NORMAL.   [name] LESION REMOVAL N/A 05/31/2015   Procedure: REMOVAL RIGHT AND LEFT LESIONS OF MANDIBLE;  Surgeon: Diona Browner, DDS;  Location: [facility];  Service: Oral Surgery;  Laterality: N/A;  . MALONEY DILATION  12/29/2010   RMR;  . NM MYOCAR PERF WALL MOTION  2006   "relavtiely normal" persantine, mild anterior thinning (breast attenuation artifact), no region of scar/ischemia  . OVARIAN CYST REMOVAL    . RECTOCELE REPAIR N/A 06/29/2015   Procedure: POSTERIOR REPAIR (RECTOCELE);  Surgeon: Jonnie Kind, MD;  Location: [facility];  Service: Gynecology;  Laterality: N/A;  . SPINE SURGERY  09/29/2010   Dr. Rolena Infante  . surgical excision of 3 tumors from right thigh and right buttock  and left upper thigh  2010  . TOOTH EXTRACTION Bilateral 12/14/2014   Procedure: REMOVAL OF BILATERAL MANDIBULAR EXOSTOSES;  Surgeon: Diona Browner, DDS;  Location: [facility];  Service: Oral Surgery;  Laterality: Bilateral;  . TRANSTHORACIC ECHOCARDIOGRAM  2010   EF 60-65%, mild conc LVH, grade 1 diastolic dysfunction; mildly calcified MV annulus with mildly thickened leaflets, mildly calcified MR annulus     Current Outpatient Prescriptions  Medication Sig Dispense Refill  . ACCU-CHEK FASTCLIX LANCETS MISC Use to test blood sugar 4 times daily. Dx: E10.65 408 each 1  . albuterol (PROVENTIL HFA;VENTOLIN HFA) 108 (90 BASE) MCG/ACT inhaler Inhale 1 puff into the lungs 3 (three) times daily. 1 Inhaler 2  . Alcohol Swabs (B-D SINGLE USE SWABS REGULAR) PADS Use for injections and glucose testing 8 times daily. Dx: E10.65 900 each 1  . amLODipine (NORVASC) 5 MG tablet Take 2 tablets (10 mg total) by mouth daily. 60 tablet 3  . aspirin EC 81 MG tablet Take 81 mg by mouth daily.    . Azelastine-Fluticasone (DYMISTA) 137-50 MCG/ACT SUSP Place 1 puff into the nose at bedtime. 1 Bottle 0  . BD PEN  NEEDLE NANO U/F 32G X 4 MM MISC USE FOUR TIMES DAILY 360 each 3  . Blood Glucose Calibration (ACCU-CHEK SMARTVIEW CONTROL) LIQD 1 each by Other [name] as needed. 1 each 2  . Blood Glucose Monitoring Suppl (ACCU-CHEK NANO SMARTVIEW) W/DEVICE KIT 1 each by Does not apply [name] daily. Dx: E10.65 1 kit 0  . Cholecalciferol (VITAMIN D) 2000 units CAPS Take 1 capsule by mouth daily.    . ciprofloxacin (CIPRO) 500 MG tablet Take 1 tablet (500 mg total) by mouth 2 (two) times daily. 6 tablet 0  . clobetasol cream (TEMOVATE) 6.96 % Apply 1 application topically daily.    . cloNIDine (CATAPRES) 0.3 MG tablet TAKE 1 TABLET BY MOUTH EVERY EIGHT HOURS. ONCE AT 8AM, 4PM, AND 12 MIDNIGHT. 90 tablet 3  . diclofenac sodium (VOLTAREN) 1 % GEL  Apply 2 g topically daily as needed (Pain). 100 g 2  . dicyclomine (BENTYL) 10 MG capsule TAKE 1 CAPSULE BY MOUTH THREE TIMES DAILY BEFORE MEALS. 90 capsule 1  . glucose blood (ACCU-CHEK SMARTVIEW) test strip Use to test blood sugar 4 times daily as instructed. Dx: E10.65 375 each 1  . hydroxychloroquine (PLAQUENIL) 200 MG tablet Take 200 mg by mouth daily.    . insulin aspart (NOVOLOG FLEXPEN) 100 UNIT/ML FlexPen Inject 16-30 Units into the skin 3 (three) times daily with meals. 30 mL 2  . Insulin Glargine (TOUJEO SOLOSTAR) 300 UNIT/ML SOPN Inject 25 Units into the skin at bedtime. 6 pen 1  . lamoTRIgine (LAMICTAL) 100 MG tablet TAKE 1 TABLET BY MOUTH TWICE DAILY. 60 tablet 6  . levothyroxine (SYNTHROID, LEVOTHROID) 50 MCG tablet TAKE 1 TABLET BY MOUTH ONCE DAILY AND 1/2 TABLET ON SUNDAYS. 30 tablet 2  . LORazepam (ATIVAN) 0.5 MG tablet Take 1 tablet (0.5 mg total) by mouth 3 (three) times daily. 90 tablet 2  . LORazepam (ATIVAN) 0.5 MG tablet Take 1 tablet (0.5 mg total) by mouth 3 (three) times daily. 90 tablet 3  . losartan (COZAAR) 50 MG tablet Take 1 tablet (50 mg total) by mouth daily. 30 tablet 4  . methocarbamol (ROBAXIN) 500 MG tablet Take 500 mg by mouth daily.    .  metoprolol (LOPRESSOR) 50 MG tablet TAKE 1 TABLET BY MOUTH TWICE DAILY. 180 tablet 0  . montelukast (SINGULAIR) 10 MG tablet TAKE ONE TABLET BY MOUTH ONCE DAILY. 30 tablet 3  . Multiple Vitamins-Minerals (ONE-A-DAY 50 PLUS PO) Take 1 tablet by mouth daily.    [name] Nystatin (NYAMYC) 100000 UNIT/GM POWD APPLY TO AFFECTED AREA 4 TIMES DAILY. 30 g 2  . polyethylene glycol powder (MIRALAX) powder Take 17 g by mouth daily. To prevent constipation 255 g prn  . pregabalin (LYRICA) 75 MG capsule Take 75 mg by mouth 2 (two) times daily.     . RABEprazole (ACIPHEX) 20 MG tablet TAKE 1 TABLET BY MOUTH TWICE DAILY. 60 tablet 5  . RESTASIS 0.05 % ophthalmic emulsion Place 1 drop into both eyes 2 (two) times daily.     . risperiDONE (RISPERDAL) 0.5 MG tablet Take 1 tablet (0.5 mg total) by mouth at bedtime. 30 tablet 3  . rosuvastatin (CRESTOR) 5 MG tablet TAKE 1 TABLET BY MOUTH AT BEDTIME. 30 tablet 3  . sertraline (ZOLOFT) 100 MG tablet Take 2 tablets (200 mg total) by mouth daily. 60 tablet 3  . traZODone (DESYREL) 150 MG tablet Take 2 tablets (300 mg total) by mouth at bedtime. 60 tablet 3  . vitamin B-12 (CYANOCOBALAMIN) 1000 MCG tablet Take 1,000 mcg by mouth daily.    . vitamin E 400 UNIT capsule Take 400 Units by mouth daily.     No current facility-administered medications for this visit.     Allergies:   Cephalexin; Iron; Milk-related compounds; Penicillins; and Phenazopyridine hcl    Social History:  The patient  reports that she has been smoking Cigarettes.  She has a 1.75 pack-year smoking history. She has never used smokeless tobacco. She reports that she does not drink alcohol or use drugs.   Family History:  The patient's family history includes Alcohol abuse in her maternal uncle; Cancer in her sister and sister; Diabetes in her brother, brother, and father; Heart attack in her maternal grandfather and mother; Hypertension in her brother and son; Kidney failure in her father; Pancreatic cancer  in her sister;  Pneumonia in her father; Sleep apnea in her son; Stroke in her maternal grandmother.    ROS: All other systems are reviewed and negative. Unless otherwise mentioned in H&P    PHYSICAL EXAM: VS:  BP (!) 106/58   Pulse (!) 57   Ht 5' 1"  (1.549 m)   Wt 160 lb (72.6 kg)   LMP  (Exact Date)   SpO2 97%   BMI 30.23 kg/m  , BMI Body mass index is 30.23 kg/m. GEN: Well nourished, well developed, in no acute distress  HEENT: normal  Neck: no JVD, carotid bruits, or masses Cardiac: RRR; no murmurs, rubs, or gallops,no edema  Respiratory:  clear to auscultation bilaterally, normal work of breathing GI: soft, nontender, nondistended, + BS MS: no deformity or atrophy  Skin: warm and dry, no rash Neuro:  Strength and sensation are intact Psych: euthymic mood, full affect   Recent Labs: 02/21/2015: Magnesium 2.0; TSH 1.207 06/30/2015: Hemoglobin 9.6; Platelets 187 07/19/2015: ALT 12; BUN 23; Creat 0.90; Potassium 4.0; Sodium 141    Lipid Panel    Component Value Date/Time   CHOL 136 07/19/2015 0925   TRIG 131 07/19/2015 0925   HDL 44 (L) 07/19/2015 0925   CHOLHDL 3.1 07/19/2015 0925   VLDL 26 07/19/2015 0925   LDLCALC 66 07/19/2015 0925      Wt Readings from Last 3 Encounters:  11/01/15 160 lb (72.6 kg)  10/29/15 160 lb (72.6 kg)  10/25/15 162 lb (73.5 kg)     ASSESSMENT AND PLAN:  1.  Chest Pain: Non-cardiac in etiology. Stress test was normal No changes in regimen. See in one year.  2. Hypertension: Labile. BP elevated on follow up check. Continue on current regimen   3. Anxiety and Depression: Continue to follow up with Dr. Durward Fortes.    Current medicines are reviewed at length with the patient today.    Labs/ tests ordered today include: None No orders of the defined types were placed in this encounter.    Disposition:   FU with one year.   Signed, [name] Sims, NP  11/01/2015 5:18 PM    [facility]  [address], [place], [place] [address] Phone: [phone]; Fax: [phone]

## 2015-11-01 NOTE — Telephone Encounter (Signed)
Called and spoke with patient about getting lab work, patient scheduled appointment on Friday at 8:45 to get labs drawn, no other questions or concerns at this time.

## 2015-11-01 NOTE — Telephone Encounter (Signed)
Pt is asking if there is blood work she is supposed to have done before her [name] appt

## 2015-11-01 NOTE — Telephone Encounter (Signed)
Patient want to know if you called her. please advise.

## 2015-11-01 NOTE — Telephone Encounter (Signed)
Yes, she should have stopped at the lab at the time of the appointment. Please have her come back for those.

## 2015-11-02 ENCOUNTER — Telehealth: Payer: Self-pay | Admitting: Internal Medicine

## 2015-11-02 NOTE — Telephone Encounter (Signed)
PT requests her lab orders be sent to the [facility] on [address] in [place].  She will be getting her labs done there.

## 2015-11-03 ENCOUNTER — Other Ambulatory Visit: Payer: Self-pay | Admitting: Internal Medicine

## 2015-11-03 DIAGNOSIS — E119 Type 2 diabetes mellitus without complications: Secondary | ICD-10-CM | POA: Diagnosis not present

## 2015-11-03 DIAGNOSIS — E039 Hypothyroidism, unspecified: Secondary | ICD-10-CM | POA: Diagnosis not present

## 2015-11-03 DIAGNOSIS — J440 Chronic obstructive pulmonary disease with acute lower respiratory infection: Secondary | ICD-10-CM | POA: Diagnosis not present

## 2015-11-03 DIAGNOSIS — I69354 Hemiplegia and hemiparesis following cerebral infarction affecting left non-dominant side: Secondary | ICD-10-CM | POA: Diagnosis not present

## 2015-11-03 DIAGNOSIS — I10 Essential (primary) hypertension: Secondary | ICD-10-CM | POA: Diagnosis not present

## 2015-11-04 LAB — TSH: TSH: 1.03 u[IU]/mL (ref 0.450–4.500)

## 2015-11-05 ENCOUNTER — Other Ambulatory Visit: Payer: Self-pay

## 2015-11-06 DIAGNOSIS — M79609 Pain in unspecified limb: Secondary | ICD-10-CM | POA: Diagnosis not present

## 2015-11-08 ENCOUNTER — Ambulatory Visit (INDEPENDENT_AMBULATORY_CARE_PROVIDER_SITE_OTHER): Payer: Commercial Managed Care - HMO | Admitting: Otolaryngology

## 2015-11-08 ENCOUNTER — Telehealth: Payer: Self-pay

## 2015-11-08 ENCOUNTER — Other Ambulatory Visit: Payer: Self-pay | Admitting: Internal Medicine

## 2015-11-08 DIAGNOSIS — R040 Epistaxis: Secondary | ICD-10-CM

## 2015-11-08 NOTE — Telephone Encounter (Signed)
Called patient and gave lab results. Patient had no questions or concerns.  

## 2015-11-09 ENCOUNTER — Ambulatory Visit
Admission: RE | Admit: 2015-11-09 | Discharge: 2015-11-09 | Disposition: A | Discharge status: Home / Self Care | Payer: Commercial Managed Care - HMO | Source: Ambulatory Visit | Attending: Internal Medicine | Admitting: Internal Medicine

## 2015-11-09 DIAGNOSIS — E042 Nontoxic multinodular goiter: Secondary | ICD-10-CM | POA: Diagnosis not present

## 2015-11-10 ENCOUNTER — Telehealth: Payer: Self-pay

## 2015-11-10 ENCOUNTER — Other Ambulatory Visit: Payer: Self-pay | Admitting: Internal Medicine

## 2015-11-10 NOTE — Telephone Encounter (Signed)
Called and left message advising of normal results on [name], nothing was seen that needed intervention for now, gave call back number if any questions.

## 2015-11-16 DIAGNOSIS — M5010 Cervical disc disorder with radiculopathy, unspecified cervical region: Secondary | ICD-10-CM | POA: Diagnosis not present

## 2015-11-16 DIAGNOSIS — I10 Essential (primary) hypertension: Secondary | ICD-10-CM | POA: Diagnosis not present

## 2015-11-16 DIAGNOSIS — E119 Type 2 diabetes mellitus without complications: Secondary | ICD-10-CM | POA: Diagnosis not present

## 2015-11-16 DIAGNOSIS — J440 Chronic obstructive pulmonary disease with acute lower respiratory infection: Secondary | ICD-10-CM | POA: Diagnosis not present

## 2015-11-16 DIAGNOSIS — I69354 Hemiplegia and hemiparesis following cerebral infarction affecting left non-dominant side: Secondary | ICD-10-CM | POA: Diagnosis not present

## 2015-11-16 DIAGNOSIS — Z794 Long term (current) use of insulin: Secondary | ICD-10-CM | POA: Diagnosis not present

## 2015-11-16 DIAGNOSIS — F250 Schizoaffective disorder, bipolar type: Secondary | ICD-10-CM | POA: Diagnosis not present

## 2015-11-16 DIAGNOSIS — F17210 Nicotine dependence, cigarettes, uncomplicated: Secondary | ICD-10-CM | POA: Diagnosis not present

## 2015-11-16 DIAGNOSIS — Z9181 History of falling: Secondary | ICD-10-CM | POA: Diagnosis not present

## 2015-11-28 IMAGING — CR DG CHEST 2V
2 series · 2 of 2 positions shown · non-contrast
Comparison: Chest x-ray of 10/30/2013 and 09/15/2013

CLINICAL DATA: Tobacco use, diabetes, hypertension, cough for 2
months

EXAM:
CHEST  2 VIEW

[view not recorded (1 of 2)]
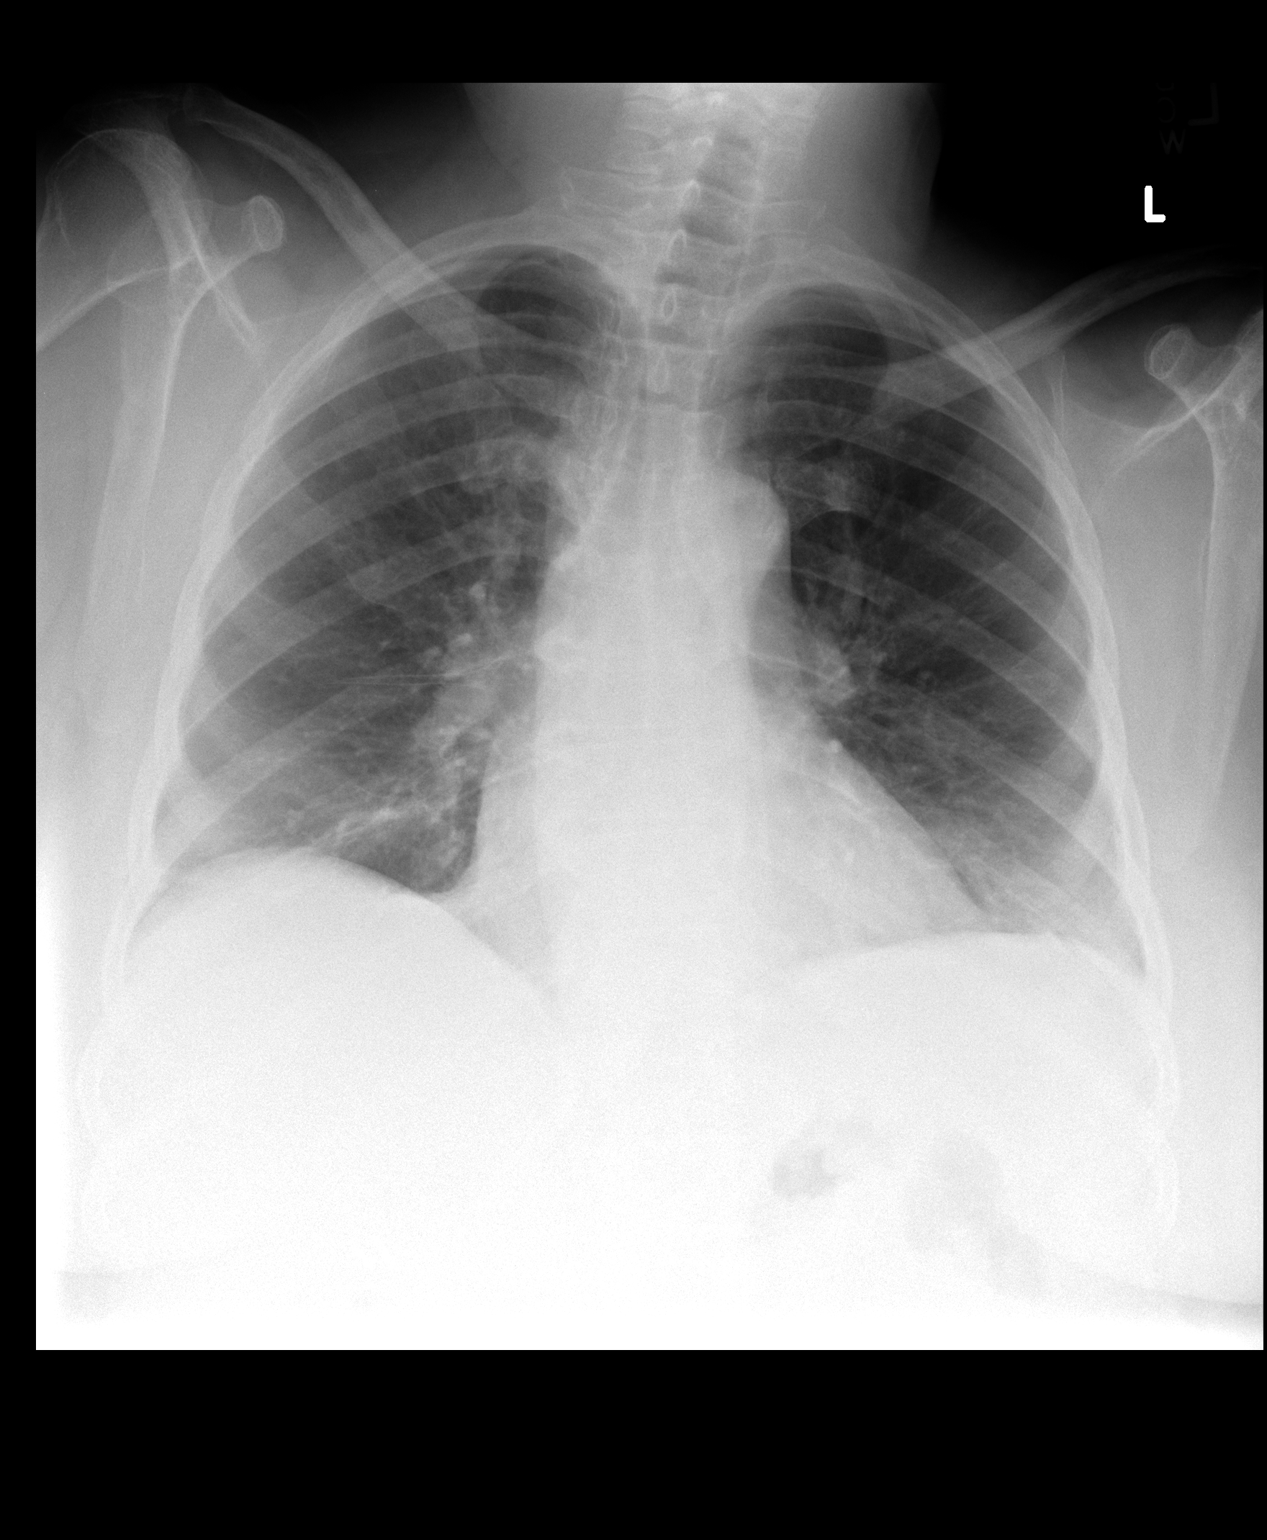

[view not recorded (2 of 2)]
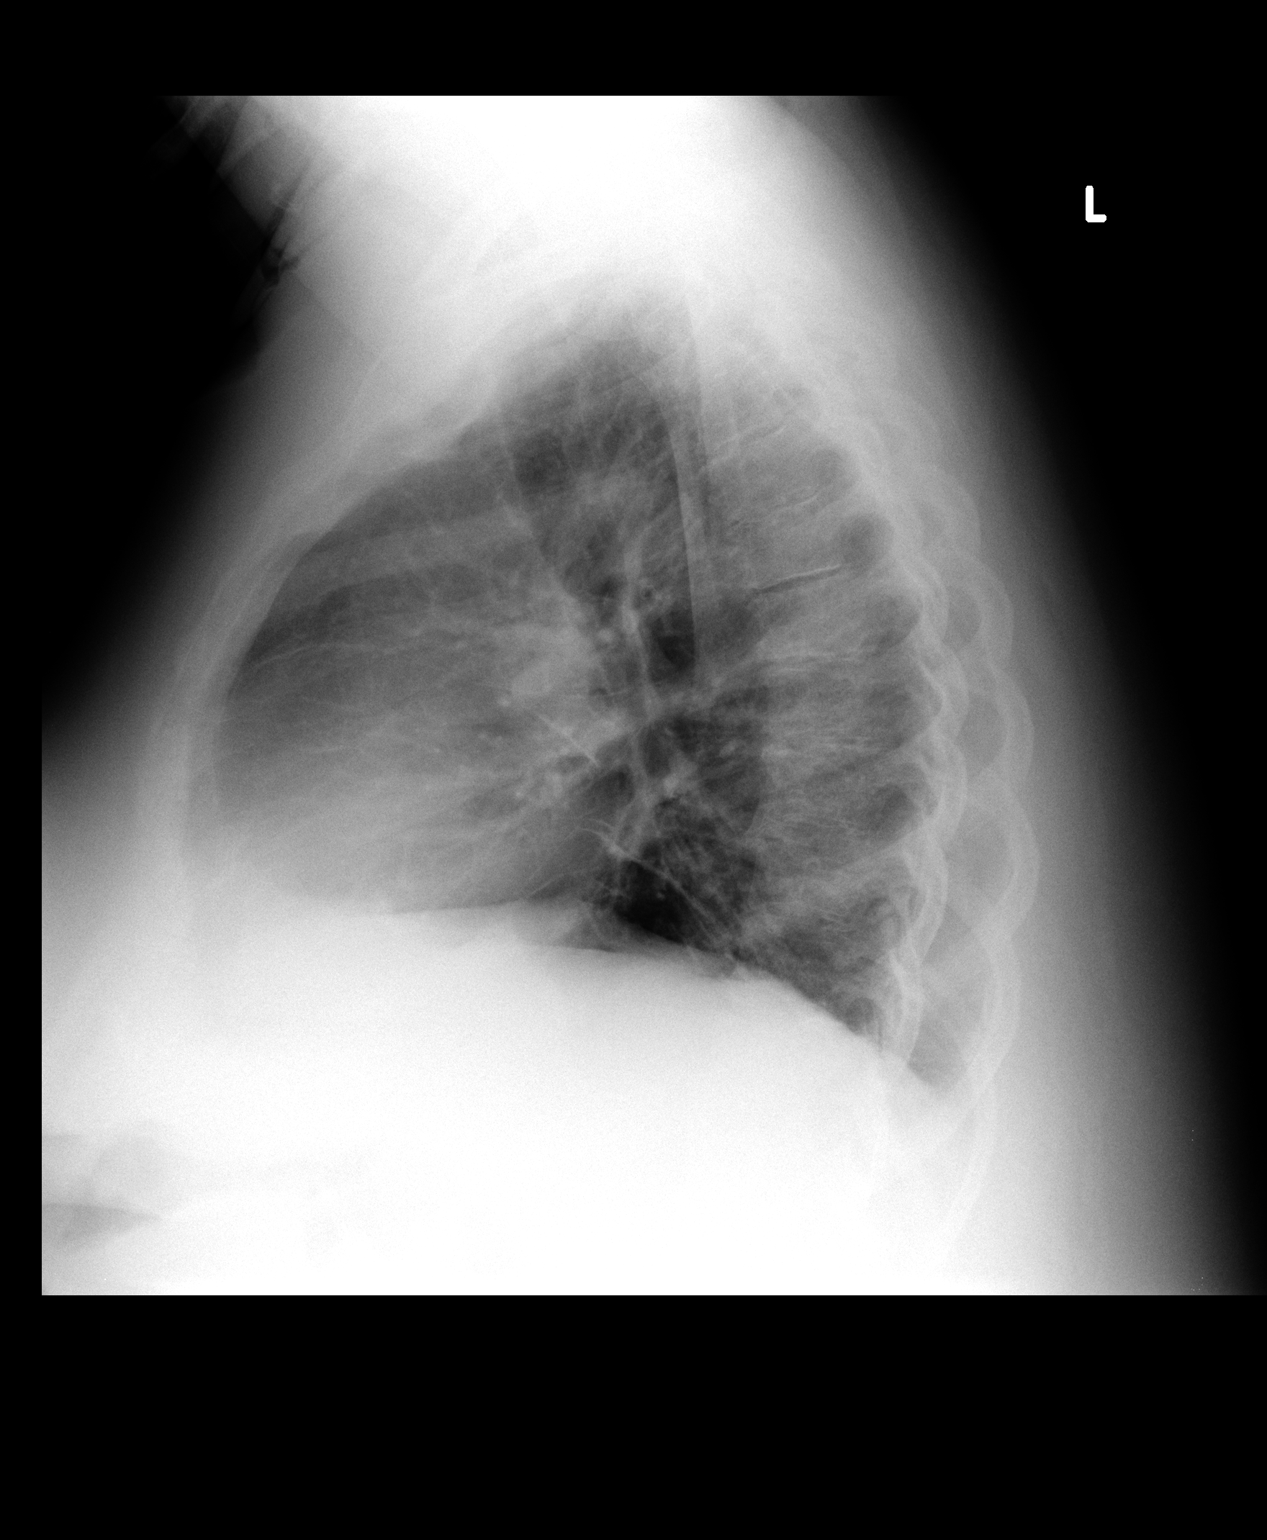

[2 of 2 positions shown; findings below may reference images not displayed]

FINDINGS: Linear scarring at the right lung base is stable. No focal
infiltrate or effusion is seen. No suspicious lung nodule is
evident. Mediastinal and hilar contours are unchanged and mild
cardiomegaly is stable. There are degenerative changes throughout
the thoracic spine.
IMPRESSION: Stable linear scarring at the right lung base. No definite active
process.

## 2015-11-29 DIAGNOSIS — Z794 Long term (current) use of insulin: Secondary | ICD-10-CM | POA: Diagnosis not present

## 2015-11-29 DIAGNOSIS — J440 Chronic obstructive pulmonary disease with acute lower respiratory infection: Secondary | ICD-10-CM | POA: Diagnosis not present

## 2015-11-29 DIAGNOSIS — I10 Essential (primary) hypertension: Secondary | ICD-10-CM | POA: Diagnosis not present

## 2015-11-29 DIAGNOSIS — I69354 Hemiplegia and hemiparesis following cerebral infarction affecting left non-dominant side: Secondary | ICD-10-CM | POA: Diagnosis not present

## 2015-11-29 DIAGNOSIS — Z9181 History of falling: Secondary | ICD-10-CM | POA: Diagnosis not present

## 2015-11-29 DIAGNOSIS — M5010 Cervical disc disorder with radiculopathy, unspecified cervical region: Secondary | ICD-10-CM | POA: Diagnosis not present

## 2015-11-29 DIAGNOSIS — F250 Schizoaffective disorder, bipolar type: Secondary | ICD-10-CM | POA: Diagnosis not present

## 2015-11-29 DIAGNOSIS — F17210 Nicotine dependence, cigarettes, uncomplicated: Secondary | ICD-10-CM | POA: Diagnosis not present

## 2015-11-29 DIAGNOSIS — E119 Type 2 diabetes mellitus without complications: Secondary | ICD-10-CM | POA: Diagnosis not present

## 2015-12-02 DIAGNOSIS — R32 Unspecified urinary incontinence: Secondary | ICD-10-CM | POA: Diagnosis not present

## 2015-12-02 DIAGNOSIS — I639 Cerebral infarction, unspecified: Secondary | ICD-10-CM | POA: Diagnosis not present

## 2015-12-02 DIAGNOSIS — M150 Primary generalized (osteo)arthritis: Secondary | ICD-10-CM | POA: Diagnosis not present

## 2015-12-03 ENCOUNTER — Other Ambulatory Visit: Payer: Self-pay | Admitting: Internal Medicine

## 2015-12-03 ENCOUNTER — Other Ambulatory Visit: Payer: Self-pay | Admitting: Family Medicine

## 2015-12-06 ENCOUNTER — Ambulatory Visit (INDEPENDENT_AMBULATORY_CARE_PROVIDER_SITE_OTHER): Payer: Commercial Managed Care - HMO | Admitting: Otolaryngology

## 2015-12-06 ENCOUNTER — Ambulatory Visit (INDEPENDENT_AMBULATORY_CARE_PROVIDER_SITE_OTHER): Payer: Medicaid Other

## 2015-12-06 DIAGNOSIS — R040 Epistaxis: Secondary | ICD-10-CM | POA: Diagnosis not present

## 2015-12-06 DIAGNOSIS — Z23 Encounter for immunization: Secondary | ICD-10-CM

## 2015-12-07 ENCOUNTER — Ambulatory Visit: Payer: Self-pay | Admitting: Family Medicine

## 2015-12-07 DIAGNOSIS — M79609 Pain in unspecified limb: Secondary | ICD-10-CM | POA: Diagnosis not present

## 2015-12-13 DIAGNOSIS — Z9181 History of falling: Secondary | ICD-10-CM | POA: Diagnosis not present

## 2015-12-13 DIAGNOSIS — M5010 Cervical disc disorder with radiculopathy, unspecified cervical region: Secondary | ICD-10-CM | POA: Diagnosis not present

## 2015-12-13 DIAGNOSIS — Z794 Long term (current) use of insulin: Secondary | ICD-10-CM | POA: Diagnosis not present

## 2015-12-13 DIAGNOSIS — F250 Schizoaffective disorder, bipolar type: Secondary | ICD-10-CM | POA: Diagnosis not present

## 2015-12-13 DIAGNOSIS — I10 Essential (primary) hypertension: Secondary | ICD-10-CM | POA: Diagnosis not present

## 2015-12-13 DIAGNOSIS — F17210 Nicotine dependence, cigarettes, uncomplicated: Secondary | ICD-10-CM | POA: Diagnosis not present

## 2015-12-13 DIAGNOSIS — I69354 Hemiplegia and hemiparesis following cerebral infarction affecting left non-dominant side: Secondary | ICD-10-CM | POA: Diagnosis not present

## 2015-12-13 DIAGNOSIS — J440 Chronic obstructive pulmonary disease with acute lower respiratory infection: Secondary | ICD-10-CM | POA: Diagnosis not present

## 2015-12-13 DIAGNOSIS — E119 Type 2 diabetes mellitus without complications: Secondary | ICD-10-CM | POA: Diagnosis not present

## 2015-12-15 ENCOUNTER — Encounter: Payer: Self-pay | Admitting: *Deleted

## 2015-12-16 ENCOUNTER — Ambulatory Visit (INDEPENDENT_AMBULATORY_CARE_PROVIDER_SITE_OTHER): Payer: Commercial Managed Care - HMO | Admitting: Otolaryngology

## 2015-12-16 DIAGNOSIS — R040 Epistaxis: Secondary | ICD-10-CM | POA: Diagnosis not present

## 2015-12-17 ENCOUNTER — Telehealth: Payer: Self-pay | Admitting: Neurology

## 2015-12-17 NOTE — Telephone Encounter (Signed)
ENT provider's info:  Su Raynelle Bring, MD, [address], [address] [place], [place] [address] [phone]

## 2015-12-17 NOTE — Telephone Encounter (Signed)
Patient wants to talk to someone about her having nose bleeds please call [phone]

## 2015-12-17 NOTE — Telephone Encounter (Signed)
If the ENT doctor thinks a brain scan is warranted, then he/she should be the one to order it.

## 2015-12-17 NOTE — Telephone Encounter (Signed)
Left message on machine for pt to return call to the office.  

## 2015-12-17 NOTE — Telephone Encounter (Signed)
Spoke with pt. She stated she is having daily Nose bleeds. Sometimes multiple a day. Pt has been seeing ENT, can not determine where bleed is coming from. Stated ENT (Dr. Melene Plan) wanted a Brain scan ordered. Advised pt to please call Dr. Verlan Friends office to request records (I also requested records via fax). Pt/Dr. Glorious Peach brain mass has returned. Please advise.

## 2015-12-17 NOTE — Telephone Encounter (Signed)
Called and spoke to ENT office. They do not know what the pt is talking about. She is scheduled for a f/u with them on Monday. They will address it at that time.

## 2015-12-20 ENCOUNTER — Ambulatory Visit (INDEPENDENT_AMBULATORY_CARE_PROVIDER_SITE_OTHER): Payer: Commercial Managed Care - HMO | Admitting: Otolaryngology

## 2015-12-20 DIAGNOSIS — F17210 Nicotine dependence, cigarettes, uncomplicated: Secondary | ICD-10-CM | POA: Diagnosis not present

## 2015-12-20 DIAGNOSIS — I69354 Hemiplegia and hemiparesis following cerebral infarction affecting left non-dominant side: Secondary | ICD-10-CM | POA: Diagnosis not present

## 2015-12-20 DIAGNOSIS — I10 Essential (primary) hypertension: Secondary | ICD-10-CM | POA: Diagnosis not present

## 2015-12-20 DIAGNOSIS — R040 Epistaxis: Secondary | ICD-10-CM

## 2015-12-20 DIAGNOSIS — F250 Schizoaffective disorder, bipolar type: Secondary | ICD-10-CM | POA: Diagnosis not present

## 2015-12-20 DIAGNOSIS — Z9181 History of falling: Secondary | ICD-10-CM | POA: Diagnosis not present

## 2015-12-20 DIAGNOSIS — Z794 Long term (current) use of insulin: Secondary | ICD-10-CM | POA: Diagnosis not present

## 2015-12-20 DIAGNOSIS — M5010 Cervical disc disorder with radiculopathy, unspecified cervical region: Secondary | ICD-10-CM | POA: Diagnosis not present

## 2015-12-20 DIAGNOSIS — J440 Chronic obstructive pulmonary disease with acute lower respiratory infection: Secondary | ICD-10-CM | POA: Diagnosis not present

## 2015-12-20 DIAGNOSIS — E119 Type 2 diabetes mellitus without complications: Secondary | ICD-10-CM | POA: Diagnosis not present

## 2015-12-21 ENCOUNTER — Ambulatory Visit: Payer: Self-pay | Admitting: Neurology

## 2015-12-22 DIAGNOSIS — E1151 Type 2 diabetes mellitus with diabetic peripheral angiopathy without gangrene: Secondary | ICD-10-CM | POA: Diagnosis not present

## 2015-12-22 DIAGNOSIS — I739 Peripheral vascular disease, unspecified: Secondary | ICD-10-CM | POA: Diagnosis not present

## 2015-12-24 DIAGNOSIS — Z794 Long term (current) use of insulin: Secondary | ICD-10-CM

## 2015-12-24 DIAGNOSIS — I69354 Hemiplegia and hemiparesis following cerebral infarction affecting left non-dominant side: Secondary | ICD-10-CM | POA: Diagnosis not present

## 2015-12-24 DIAGNOSIS — J440 Chronic obstructive pulmonary disease with acute lower respiratory infection: Secondary | ICD-10-CM | POA: Diagnosis not present

## 2015-12-24 DIAGNOSIS — Z7982 Long term (current) use of aspirin: Secondary | ICD-10-CM

## 2015-12-24 DIAGNOSIS — I10 Essential (primary) hypertension: Secondary | ICD-10-CM | POA: Diagnosis not present

## 2015-12-24 DIAGNOSIS — Z9181 History of falling: Secondary | ICD-10-CM

## 2015-12-24 DIAGNOSIS — M5010 Cervical disc disorder with radiculopathy, unspecified cervical region: Secondary | ICD-10-CM

## 2015-12-24 DIAGNOSIS — E119 Type 2 diabetes mellitus without complications: Secondary | ICD-10-CM | POA: Diagnosis not present

## 2015-12-24 DIAGNOSIS — F17210 Nicotine dependence, cigarettes, uncomplicated: Secondary | ICD-10-CM

## 2015-12-24 DIAGNOSIS — F250 Schizoaffective disorder, bipolar type: Secondary | ICD-10-CM

## 2015-12-26 IMAGING — CR DG CHEST 1V PORT
1 series · 1 of 1 positions shown · non-contrast
Comparison: 05/05/2014

CLINICAL DATA: Acute sudden onset abdominal pain

EXAM:
PORTABLE CHEST - 1 VIEW

[portable]
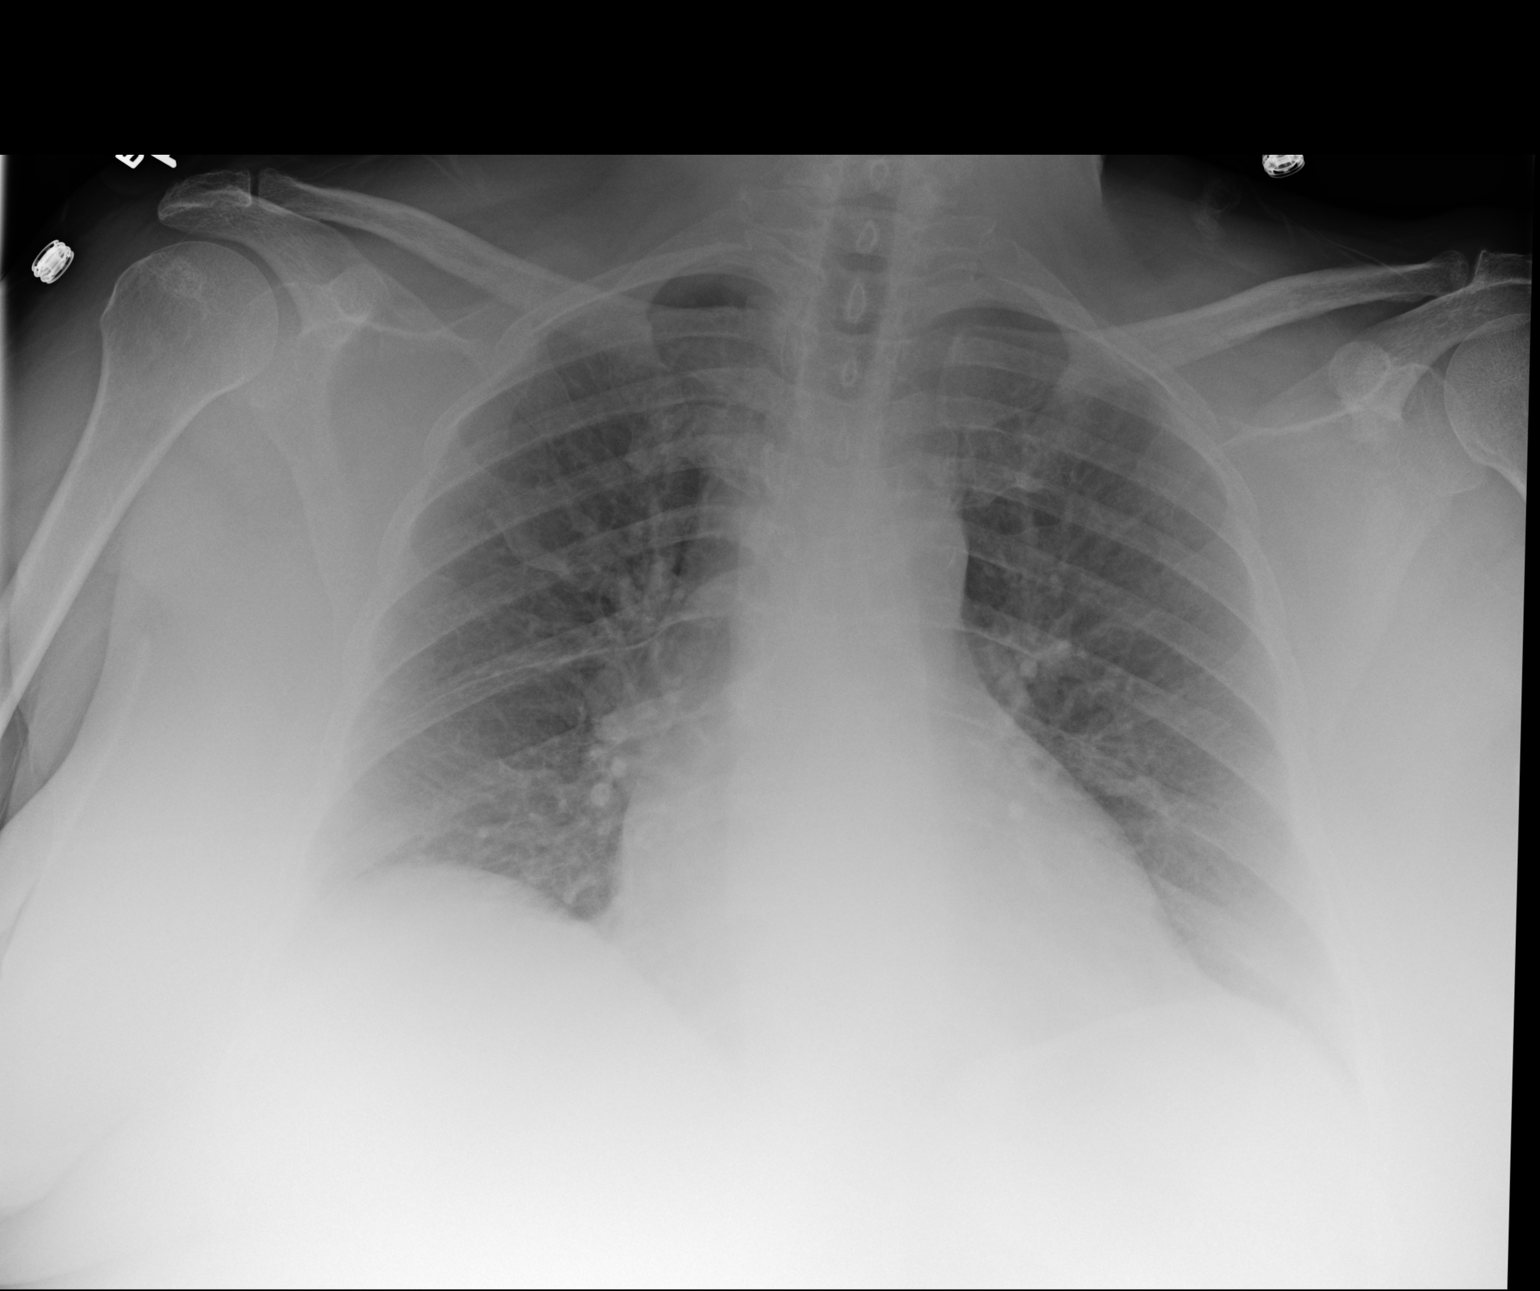

[1 of 1 positions shown; findings below may reference images not displayed]

FINDINGS: A single AP portable view of the chest demonstrates no focal
airspace consolidation or alveolar edema. The lungs are grossly
clear. There is no large effusion or pneumothorax. Cardiac and
mediastinal contours appear unremarkable.
IMPRESSION: No active disease.

## 2015-12-28 DIAGNOSIS — M961 Postlaminectomy syndrome, not elsewhere classified: Secondary | ICD-10-CM | POA: Diagnosis not present

## 2015-12-28 DIAGNOSIS — M5090 Cervical disc disorder, unspecified, unspecified cervical region: Secondary | ICD-10-CM | POA: Diagnosis not present

## 2015-12-29 ENCOUNTER — Telehealth: Payer: Self-pay

## 2015-12-29 ENCOUNTER — Other Ambulatory Visit: Payer: Self-pay | Admitting: Family Medicine

## 2015-12-29 MED ORDER — ONDANSETRON HCL 4 MG PO TABS: 4.0000 mg | ORAL_TABLET | Freq: Three times a day (TID) | ORAL | 0 refills | Status: DC | PRN

## 2015-12-29 NOTE — Telephone Encounter (Signed)
Med sent to [facility] , pls let her know if having chills and feeling ill, needs to be seen

## 2015-12-29 NOTE — Telephone Encounter (Signed)
Woke up this am feeling bad, nauseated and having chills, unsure of fever but just feels bad. Unable to come in today for appt but wants something called in for nausea and wants to see how she feels tomorrow. Please advise

## 2015-12-29 NOTE — Progress Notes (Signed)
zofran

## 2015-12-30 NOTE — Telephone Encounter (Signed)
Patient aware and will call monday if no better

## 2015-12-31 DIAGNOSIS — H04123 Dry eye syndrome of bilateral lacrimal glands: Secondary | ICD-10-CM | POA: Diagnosis not present

## 2016-01-01 DIAGNOSIS — R32 Unspecified urinary incontinence: Secondary | ICD-10-CM | POA: Diagnosis not present

## 2016-01-01 DIAGNOSIS — M150 Primary generalized (osteo)arthritis: Secondary | ICD-10-CM | POA: Diagnosis not present

## 2016-01-01 DIAGNOSIS — I639 Cerebral infarction, unspecified: Secondary | ICD-10-CM | POA: Diagnosis not present

## 2016-01-03 ENCOUNTER — Other Ambulatory Visit: Payer: Self-pay | Admitting: Family Medicine

## 2016-01-03 ENCOUNTER — Other Ambulatory Visit: Payer: Self-pay | Admitting: Internal Medicine

## 2016-01-03 DIAGNOSIS — I69354 Hemiplegia and hemiparesis following cerebral infarction affecting left non-dominant side: Secondary | ICD-10-CM | POA: Diagnosis not present

## 2016-01-03 DIAGNOSIS — M5010 Cervical disc disorder with radiculopathy, unspecified cervical region: Secondary | ICD-10-CM | POA: Diagnosis not present

## 2016-01-03 DIAGNOSIS — F250 Schizoaffective disorder, bipolar type: Secondary | ICD-10-CM | POA: Diagnosis not present

## 2016-01-03 DIAGNOSIS — Z9181 History of falling: Secondary | ICD-10-CM | POA: Diagnosis not present

## 2016-01-03 DIAGNOSIS — I10 Essential (primary) hypertension: Secondary | ICD-10-CM | POA: Diagnosis not present

## 2016-01-03 DIAGNOSIS — E119 Type 2 diabetes mellitus without complications: Secondary | ICD-10-CM | POA: Diagnosis not present

## 2016-01-03 DIAGNOSIS — J440 Chronic obstructive pulmonary disease with acute lower respiratory infection: Secondary | ICD-10-CM | POA: Diagnosis not present

## 2016-01-03 DIAGNOSIS — F17210 Nicotine dependence, cigarettes, uncomplicated: Secondary | ICD-10-CM | POA: Diagnosis not present

## 2016-01-03 DIAGNOSIS — Z794 Long term (current) use of insulin: Secondary | ICD-10-CM | POA: Diagnosis not present

## 2016-01-04 NOTE — Addendum Note (Signed)
Addended by: Annitta Needs on: 01/04/2016 01:42 PM   Modules accepted: Orders

## 2016-01-05 ENCOUNTER — Other Ambulatory Visit: Payer: Self-pay | Admitting: Family Medicine

## 2016-01-05 ENCOUNTER — Other Ambulatory Visit: Payer: Self-pay | Admitting: Internal Medicine

## 2016-01-05 ENCOUNTER — Ambulatory Visit (INDEPENDENT_AMBULATORY_CARE_PROVIDER_SITE_OTHER): Payer: Commercial Managed Care - HMO | Admitting: Psychiatry

## 2016-01-05 ENCOUNTER — Ambulatory Visit (INDEPENDENT_AMBULATORY_CARE_PROVIDER_SITE_OTHER): Payer: Commercial Managed Care - HMO | Admitting: Internal Medicine

## 2016-01-05 ENCOUNTER — Encounter: Payer: Self-pay | Admitting: Internal Medicine

## 2016-01-05 ENCOUNTER — Encounter (HOSPITAL_COMMUNITY): Payer: Self-pay | Admitting: Psychiatry

## 2016-01-05 VITALS — BP 140/50 | HR 68 | Ht 59.25 in | Wt 165.0 lb

## 2016-01-05 DIAGNOSIS — Z86010 Personal history of colonic polyps: Secondary | ICD-10-CM

## 2016-01-05 DIAGNOSIS — M6289 Other specified disorders of muscle: Secondary | ICD-10-CM

## 2016-01-05 DIAGNOSIS — R1310 Dysphagia, unspecified: Secondary | ICD-10-CM

## 2016-01-05 DIAGNOSIS — K5900 Constipation, unspecified: Secondary | ICD-10-CM | POA: Diagnosis not present

## 2016-01-05 DIAGNOSIS — F331 Major depressive disorder, recurrent, moderate: Secondary | ICD-10-CM | POA: Diagnosis not present

## 2016-01-05 MED ORDER — NA SULFATE-K SULFATE-MG SULF 17.5-3.13-1.6 GM/177ML PO SOLN: 1.0000 | Freq: Once | ORAL | 0 refills | Status: AC

## 2016-01-05 MED ORDER — PLECANATIDE 3 MG PO TABS: 3.0000 mg | ORAL_TABLET | Freq: Every day | ORAL | 2 refills | Status: DC

## 2016-01-05 NOTE — Patient Instructions (Signed)
We will refer you to [facility] for Pelvic Floor Dysfunction for therapy  Continue Miralax, and colace and probiotic as needed unless the Trulance works for you then you can discontinue   We have given you samples of Trulance and will send Trulance to your pharmacy  You have been scheduled for an endoscopy and colonoscopy. Please follow the written instructions given to you at your visit today. Please pick up your prep supplies at the pharmacy within the next 1-3 days. If you use inhalers (even only as needed), please bring them with you on the day of your procedure.

## 2016-01-05 NOTE — Progress Notes (Signed)
Patient ID: [name], female   DOB: 1950/06/11, 65 y.o.   MRN: [id number] HPI: [name] is a 65 year old female with a past medical history of adenomatous colon polyps, chronic constipation, pelvic floor dysfunction, rectocele and cystocele status post repair, GERD, history of esophageal stricture versus dysfunctional LES who is seen in consultation at the request of [name] to evaluate ongoing issues with constipation and abdominal bloating along with dysphagia.  She is here alone today and previously saw Dr. Gala Romney for her GI care. She has had extensive workup there including colonoscopy performed on 09/25/2012. This showed pancolonic diverticulosis and a diminutive rectosigmoid polyp which was removed and found to be an adenoma. Upper endoscopy performed on the same day revealed a somewhat atonic esophagus which was dilated with [facility] dilator 91 [place]. There was a hiatal hernia. Stomach and examined duodenum appeared normal. She also had a dynamic pelvic MRI in December 2016 which showed resting and dynamic pelvic floor laxity with poor emptying of rectocele. There was also a cystocele and mild vaginal cuff prolapse. She had surgery performed by her gynecologist Dr. Glo Herring with posterior repair of the rectocele in April 2017.  Today she reports she continues to struggle with constipation, incomplete evacuation and abdominal bloating. She states that she has "trouble getting the bowel movement, down". She can go as long as one week between bowel movement. She's currently using MiraLAX Colace and probiotic and with this regimen having a bowel movement every 3-5 days. She reports occasionally when she is able to have a bowel movement she will have hard stool followed by a rush of liquid stool. She's tried Amitiza in the past which she did not think helped. Linzess was also tried but caused too much urgent loose stools.  She states that her heartburn most recently has been controlled. She is using  AcipHex 20 mg twice daily. She does state that she has developed recurrent solid food dysphagia. She states this improved after dilation performed at the time of EGD in 2014  She reports a family history of colorectal cancer and she is requesting a repeat colonoscopy. She states Dr. Buford Dresser recommended repeat colonoscopy in "3-4 years". She states she felt like her sister had pancreas and colorectal cancer. According to the chart her sister had pancreas cancer, though when I question her she states she had "both".  Past Medical History:  Diagnosis Date  . Anemia   . Anxiety    takes Ativan daily  . Arthritis   . Bipolar disorder ([place])    takes Risperdal nightly  . Blood transfusion   . Cancer ([place])    In her gum  . Carpal tunnel syndrome of right wrist 05/23/2011  . Cervical disc disorder with radiculopathy of cervical region 10/31/2012  . Chronic back pain   . Colon polyps   . COPD (chronic obstructive pulmonary disease) with chronic bronchitis ([place]) 09/16/2013   Office Spirometry 10/30/2013-submaximal effort based on appearance of loop and curve. Numbers would fit with severe restriction but her physiologic capability may be better than this. FVC 0.91/44%, and 10.74/45%, FEV1/FVC 0.81, FEF 25-75% 1.43/69%    . Depression    takes Zoloft daily  . Diabetes mellitus    Type II  . Diverticulosis    TCS 9/08 by Dr. Delfin Edis for diarrhea . Bx for micro scopic colitis negative.   . Fibromyalgia   . Frequent falls   . GERD (gastroesophageal reflux disease)    takes Aciphex daily  . Glaucoma  eye drops daily  . Gum symptoms    infection on antibiotic  . Hemiplegia affecting non-dominant side, post-stroke ([place]) 08/02/2011  . Hyperlipidemia    takes Crestor daily  . Hypertension    takes Amlodipine,Metoprolol,and Clonidine daily  . Hypothyroidism    takes Synthroid daily  . IBS (irritable bowel syndrome)   . Insomnia    takes Trazodone nightly  . Metabolic encephalopathy 7/51/0258   . Migraines    chronic headaches  . Mononeuritis lower limb   . Osteoporosis   . Pancreatitis 2006   due to Depakote with normal EUS   . Schatzki's ring    non critical / EGD with ED 8/2011with RMR  . Seizures ([place])    takes Lamictal daily.Last seizure 3 yrs ago  . Sleep apnea    on CPAP  . Stroke [facility])    left sided weakness  . Tubular adenoma of colon     Past Surgical History:  Procedure Laterality Date  . ABDOMINAL HYSTERECTOMY  1978  . BACK SURGERY  July 2012  . BACTERIAL OVERGROWTH TEST N/A 05/05/2013   Procedure: BACTERIAL OVERGROWTH TEST;  Surgeon: Daneil Dolin, MD;  [facility];  Service: Endoscopy;  Laterality: N/A;  7:30  . BIOPSY THYROID  2009  . BRAIN SURGERY  11/2011   resection of meningioma  . BREAST REDUCTION SURGERY  1994  . CARDIAC CATHETERIZATION  05/10/2005   normal coronaries, normal LV systolic function and EF (Dr. Jackie Plum)  . CARPAL TUNNEL RELEASE Left 07/22/04   Dr. Aline Brochure  . CATARACT EXTRACTION Bilateral   . CHOLECYSTECTOMY  1984  . COLONOSCOPY N/A 09/25/2012   NID:[id number] diverticulosis.  colonic polyp-removed : tubular adenoma  . CRANIOTOMY  11/23/2011   Procedure: CRANIOTOMY TUMOR EXCISION;  Surgeon: Hosie Spangle, MD;  Location: [place] [facility]URO ORS;  Service: Neurosurgery;  Laterality: N/A;  Craniotomy for tumor resection  . ESOPHAGOGASTRODUODENOSCOPY  12/29/2010   Rourk-Retained food in the esophagus and stomach, small hiatal hernia, status post Maloney dilation of the esophagus  . ESOPHAGOGASTRODUODENOSCOPY N/A 09/25/2012   TIR:[id number] atonic baggy esophagus status post Maloney dilation 58 F. Hiatal hernia  . GIVENS CAPSULE STUDY N/A 01/15/2013   NORMAL.   [name] LESION REMOVAL N/A 05/31/2015   Procedure: REMOVAL RIGHT AND LEFT LESIONS OF MANDIBLE;  Surgeon: Diona Browner, DDS;  Location: [place];  Service: Oral Surgery;  Laterality: N/A;  . MALONEY DILATION  12/29/2010   RMR;  . NM MYOCAR PERF WALL MOTION  2006   "relavtiely normal"  persantine, mild anterior thinning (breast attenuation artifact), no region of scar/ischemia  . OVARIAN CYST REMOVAL    . RECTOCELE REPAIR N/A 06/29/2015   Procedure: POSTERIOR REPAIR (RECTOCELE);  Surgeon: Jonnie Kind, MD;  Location: [facility];  Service: Gynecology;  Laterality: N/A;  . SPINE SURGERY  09/29/2010   Dr. Rolena Infante  . surgical excision of 3 tumors from right thigh and right buttock  and left upper thigh  2010  . TOOTH EXTRACTION Bilateral 12/14/2014   Procedure: REMOVAL OF BILATERAL MANDIBULAR EXOSTOSES;  Surgeon: Diona Browner, DDS;  Location: [place];  Service: Oral Surgery;  Laterality: Bilateral;  . TRANSTHORACIC ECHOCARDIOGRAM  2010   EF 60-65%, mild conc LVH, grade 1 diastolic dysfunction; mildly calcified MV annulus with mildly thickened leaflets, mildly calcified MR annulus    Outpatient Medications Prior to Visit  Medication Sig Dispense Refill  . ACCU-CHEK FASTCLIX LANCETS MISC Use to test blood sugar 4 times daily. Dx: E10.65 408  each 1  . albuterol (PROVENTIL HFA;VENTOLIN HFA) 108 (90 BASE) MCG/ACT inhaler Inhale 1 puff into the lungs 3 (three) times daily. 1 Inhaler 2  . Alcohol Swabs (B-D SINGLE USE SWABS REGULAR) PADS Use for injections and glucose testing 8 times daily. Dx: E10.65 900 each 1  . amLODipine (NORVASC) 5 MG tablet TAKE 2 TABLETS BY MOUTH ONCE DAILY. 60 tablet 0  . aspirin EC 81 MG tablet Take 81 mg by mouth daily.    . Azelastine-Fluticasone (DYMISTA) 137-50 MCG/ACT SUSP Place 1 puff into the nose at bedtime. 1 Bottle 0  . BD PEN NEEDLE NANO U/F 32G X 4 MM MISC USE FOUR TIMES DAILY 360 each 3  . Blood Glucose Calibration (ACCU-CHEK SMARTVIEW CONTROL) LIQD 1 each by Other route as needed. 1 each 2  . Blood Glucose Monitoring Suppl (ACCU-CHEK NANO SMARTVIEW) W/DEVICE KIT 1 each by Does not apply route daily. Dx: E10.65 1 kit 0  . Cholecalciferol (VITAMIN D) 2000 units CAPS Take 1 capsule by mouth daily.    . clobetasol cream (TEMOVATE) 0.26 % Apply 1  application topically daily.    . cloNIDine (CATAPRES) 0.3 MG tablet TAKE 1 TABLET BY MOUTH EVERY EIGHT HOURS. ONCE AT 8AM, 4PM, AND 12 MIDNIGHT. 90 tablet 0  . diclofenac sodium (VOLTAREN) 1 % GEL Apply 2 g topically daily as needed (Pain). 100 g 2  . dicyclomine (BENTYL) 10 MG capsule TAKE 1 CAPSULE BY MOUTH THREE TIMES DAILY BEFORE MEALS. 90 capsule 3  . glucose blood (ACCU-CHEK SMARTVIEW) test strip Use to test blood sugar 4 times daily as instructed. Dx: E10.65 375 each 1  . hydroxychloroquine (PLAQUENIL) 200 MG tablet Take 200 mg by mouth daily.    . insulin aspart (NOVOLOG FLEXPEN) 100 UNIT/ML FlexPen Inject 16-30 Units into the skin 3 (three) times daily with meals. 30 mL 2  . Insulin Glargine (TOUJEO SOLOSTAR) 300 UNIT/ML SOPN Inject 25 Units into the skin at bedtime. 6 pen 1  . lamoTRIgine (LAMICTAL) 100 MG tablet TAKE 1 TABLET BY MOUTH TWICE DAILY. 60 tablet 6  . levothyroxine (SYNTHROID, LEVOTHROID) 50 MCG tablet TAKE 1 TABLET BY MOUTH ONCE DAILY AND 1/2 TABLET ON SUNDAYS. 30 tablet 0  . LORazepam (ATIVAN) 0.5 MG tablet Take 1 tablet (0.5 mg total) by mouth 3 (three) times daily. 90 tablet 2  . losartan (COZAAR) 50 MG tablet Take 1 tablet (50 mg total) by mouth daily. 30 tablet 4  . methocarbamol (ROBAXIN) 500 MG tablet Take 500 mg by mouth daily.    . metoprolol (LOPRESSOR) 50 MG tablet TAKE 1 TABLET BY MOUTH TWICE DAILY. 180 tablet 3  . montelukast (SINGULAIR) 10 MG tablet TAKE ONE TABLET BY MOUTH ONCE DAILY. 30 tablet 3  . Multiple Vitamins-Minerals (ONE-A-DAY 50 PLUS PO) Take 1 tablet by mouth daily.    [name] Nystatin (NYAMYC) 100000 UNIT/GM POWD APPLY TO AFFECTED AREA 4 TIMES DAILY. 30 g 2  . ondansetron (ZOFRAN) 4 MG tablet Take 1 tablet (4 mg total) by mouth every 8 (eight) hours as needed for nausea or vomiting. 20 tablet 0  . polyethylene glycol powder (MIRALAX) powder Take 17 g by mouth daily. To prevent constipation 255 g prn  . pregabalin (LYRICA) 75 MG capsule Take 75 mg by  mouth 2 (two) times daily.     . RABEprazole (ACIPHEX) 20 MG tablet TAKE 1 TABLET BY MOUTH TWICE DAILY. 60 tablet 5  . RESTASIS 0.05 % ophthalmic emulsion Place 1 drop into both eyes 2 (  two) times daily.     . risperiDONE (RISPERDAL) 0.5 MG tablet Take 1 tablet (0.5 mg total) by mouth at bedtime. 30 tablet 3  . rosuvastatin (CRESTOR) 5 MG tablet TAKE 1 TABLET BY MOUTH AT BEDTIME. 30 tablet 3  . sertraline (ZOLOFT) 100 MG tablet Take 2 tablets (200 mg total) by mouth daily. 60 tablet 3  . traZODone (DESYREL) 150 MG tablet Take 2 tablets (300 mg total) by mouth at bedtime. 60 tablet 3  . vitamin B-12 (CYANOCOBALAMIN) 1000 MCG tablet Take 1,000 mcg by mouth daily.    . vitamin E 400 UNIT capsule Take 400 Units by mouth daily.    . ciprofloxacin (CIPRO) 500 MG tablet Take 1 tablet (500 mg total) by mouth 2 (two) times daily. 6 tablet 0  . LORazepam (ATIVAN) 0.5 MG tablet Take 1 tablet (0.5 mg total) by mouth 3 (three) times daily. 90 tablet 3   No facility-administered medications prior to visit.     Allergies  Allergen Reactions  . Cephalexin Hives  . Iron Nausea And Vomiting  . Milk-Related Compounds Other (See Comments)    Doesn't agree with stomach.   . Penicillins Hives    Has patient had a PCN reaction causing immediate rash, facial/tongue/throat swelling, SOB or lightheadedness with hypotension: Yes Has patient had a PCN reaction causing severe rash involving mucus membranes or skin necrosis: No Has patient had a PCN reaction that required hospitalization No Has patient had a PCN reaction occurring within the last 10 years: No If all of the above answers are "NO", then may proceed with Cephalosporin use.   [name] Phenazopyridine Hcl Hives          Family History  Problem Relation Age of Onset  . Heart attack Mother     HTN  . Pneumonia Father   . Kidney failure Father   . Diabetes Father   . Pancreatic cancer Sister   . Diabetes Brother   . Hypertension Brother   . Diabetes  Brother   . Cancer Sister     breast   . Hypertension Son   . Sleep apnea Son   . Cancer Sister     pancreatic  . Stroke Maternal Grandmother   . Heart attack Maternal Grandfather   . Alcohol abuse Maternal Uncle   . Colon cancer Neg Hx   . Anesthesia problems Neg Hx   . Hypotension Neg Hx   . Malignant hyperthermia Neg Hx   . Pseudochol deficiency Neg Hx     Social History  Substance Use Topics  . Smoking status: Light Tobacco Smoker    Packs/day: 0.25    Years: 7.00    Types: Cigarettes  . Smokeless tobacco: Never Used     Comment: continues to smoke 1/4 pack a day   . Alcohol use No     Comment:      ROS: As per history of present illness, otherwise negative  BP (!) 140/50   Pulse 68   Ht 4' 11.25" (1.505 m) Comment: measured without shoes  Wt 165 lb (74.8 kg)   LMP  (Exact Date)   BMI 33.05 kg/m  Constitutional: Well-developed and well-nourished. No distress. HEENT: Normocephalic and atraumatic. Oropharynx is clear and moist. No oropharyngeal exudate. Conjunctivae are normal.  No scleral icterus. Neck: Neck supple. Trachea midline. Cardiovascular: Normal rate, regular rhythm and intact distal pulses. No M/R/G Pulmonary/chest: Effort normal and breath sounds normal. No wheezing, rales or rhonchi. Abdominal: Soft, nontender, nondistended. Bowel sounds  active throughout. There are no masses palpable. No hepatosplenomegaly. Extremities: no clubbing, cyanosis, or edema Lymphadenopathy: No cervical adenopathy noted. Neurological: Alert and oriented to person place and time. Skin: Skin is warm and dry. No rashes noted. Psychiatric: Normal mood and affect. Behavior is normal.  RELEVANT LABS AND IMAGING: CBC    Component Value Date/Time   WBC 7.1 06/30/2015 0553   RBC 4.16 06/30/2015 0553   HGB 9.6 (L) 06/30/2015 0553   HCT 31.1 (L) 06/30/2015 0553   PLT 187 06/30/2015 0553   MCV 74.8 (L) 06/30/2015 0553   MCH 23.1 (L) 06/30/2015 0553   MCHC 30.9 06/30/2015  0553   RDW 14.6 06/30/2015 0553   LYMPHSABS 2.7 05/01/2015 0234   MONOABS 0.6 05/01/2015 0234   EOSABS 0.1 05/01/2015 0234   BASOSABS 0.0 05/01/2015 0234    CMP     Component Value Date/Time   NA 141 07/19/2015 0925   K 4.0 07/19/2015 0925   CL 105 07/19/2015 0925   CO2 28 07/19/2015 0925   GLUCOSE 298 (H) 07/19/2015 0925   BUN 23 07/19/2015 0925   CREATININE 0.90 07/19/2015 0925   CALCIUM 8.8 07/19/2015 0925   PROT 6.1 07/19/2015 0925   ALBUMIN 3.6 07/19/2015 0925   AST 13 07/19/2015 0925   ALT 12 07/19/2015 0925   ALKPHOS 146 (H) 07/19/2015 0925   BILITOT 0.2 07/19/2015 0925   GFRNONAA 67 07/19/2015 0925   GFRAA 78 07/19/2015 0925   CLINICAL DATA:  Constipation. Sensation of incomplete evacuation during defecation. Rectocele.   EXAM: MRI PELVIS WITHOUT CONTRAST   TECHNIQUE: Multiplanar multisequence MR imaging of the pelvis was performed. No intravenous contrast was administered. Ultrasound gel was administered per rectum. Dynamic imaging was performed during straining and defecation.   COMPARISON:  None.   FINDINGS: At rest, the levator ani musculature shows mild diffuse thinning and mild concave downward contour bilaterally consistent with laxity.   The following measurements are obtained at rest:   H line:  5.6 cm cm   M line: 4.7 cm   Urinary bladder base:  0.2 cm above the pubococcygeal line   Vaginal apex:  1.9 cm above the pubococcygeal line   Levator plate angle:  45 degrees   The following measurements are obtained during maximum strain/defectation:   H line:  6.5 cm   M line:  6.0 cm   Urinary bladder base:  1.9 cm below the pubococcygeal line   Vaginal apex:  0.1 cm above the pubococcygeal line   Levator plate angle:  60 degrees   Other findings during maximum strain/defecation: A cystocele is present, with bladder base approximately 1.9 cm below the pubococcygeal line.   Prior hysterectomy noted. Decent of the vaginal apex is  also seen to the level of the pubococcygeal line.   A moderate size rectocele is seen which extends 2.9 cm anterior to the anal canal. There is poor emptying of the entire rectum as well as the rectocele during evacuation.   IMPRESSION: Resting and dynamic pelvic floor laxity with involvement of anterior, middle and posterior compartments, including a cystocele, mild vaginal cuff prolapse, and rectocele. There is poor emptying of the rectocele as well as the entire rectum during evacuation.     Electronically Signed   By: Earle Gell M.D.   On: 03/11/2015 10:48    FINDINGS: Mild to moderate esophageal dysmotility.  No fixed esophageal narrowing or stricture.  Apparent narrowing at the GE junction. However, on prone swallows, the distal esophagus  was confirmed to distended normally. Given these findings, the appearance of the distal esophagus is favored to reflect lower esophageal sphincter dysfunction.  A barium tablet was not administered due to esophageal dysmotility.  IMPRESSION: Mild to moderate esophageal dysmotility.  Suspected lower esophageal sphincter dysfunction rather than true narrowing at the distal esophagus/ GE junction.  Normal caliber of the lower esophagus on prone swallows.  Electronically Signed: By: Julian Hy M.D. On: 07/30/2014 12:00  ADDENDUM REPORT: 08/24/2014 11:17   ADDENDUM: Patient returned for assessment during the swallowing of a 12.5 mm diameter barium tablet.   Under fluoroscopic visualization, the barium tablet transiently delayed at the gastroesophageal junction but passed beyond into the stomach with additional swallows of water.   No obstruction of the tablet was identified.   Mark A. Thornton Papas, M.D.   08/24/2014 @ 1100 hr     Electronically Signed   By: Lavonia Dana M.D.   On: 08/24/2014 11:17    Addended by Lavonia Dana, MD on 08/24/2014 11:19 AM    ASSESSMENT/PLAN: 65 year old female with a past medical  history of adenomatous colon polyps, chronic constipation, pelvic floor dysfunction, rectocele and cystocele status post repair, GERD, history of esophageal stricture versus dysfunctional LES who is seen in consultation at the request of [name] to evaluate ongoing issues with constipation and abdominal bloating along with dysphagia.  1. Constipation/incomplete evacuation/pelvic floor dysfunction -- we discussed her symptoms at length today which I think are in part related to pelvic floor dysfunction and dysyngeric defecation. She has had imaging suggesting pelvic floor dysfunction. Rectocele repair doesn't seem to have changed her defecatory dysfunction very much.  Given lack of response to Amitiza and diarrhea with Linzess I recommended we try Trulance 3 mg daily.  He can continue MiraLAX and Colace but his stools are too loose of recommended she discontinue MiraLAX and Colace first. Of also recommended we refer her to [facility] for pelvic floor physical therapy.  2. History of adenomatous colon polyp/possible family history of colon cancer -- she is very worried about possible colon cancer and requests colonoscopy today. I told her that by guidelines her colonoscopy would be due in 2019. She understands this recommendation but prefers repeat colonoscopy at this time. We discussed the risks, benefits and alternatives and she wishes to proceed. Given her overall concern we will proceed with repeat colonoscopy.  3. GERD with dysphagia -- history of possible narrowing at GE junction suggested by transient delay in barium tablet at barium swallow. Reports favorable response to prior dilation. We'll proceed with EGD with repeat dilation depending on findings at examination. We discussed the risk, benefit and alternatives and she wishes to proceed.    KR:[id number] E [name], [address], [place], [place] [address]

## 2016-01-06 ENCOUNTER — Other Ambulatory Visit: Payer: Self-pay | Admitting: Internal Medicine

## 2016-01-06 DIAGNOSIS — M79609 Pain in unspecified limb: Secondary | ICD-10-CM | POA: Diagnosis not present
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
## 2016-01-07 ENCOUNTER — Ambulatory Visit: Payer: Self-pay | Admitting: Internal Medicine

## 2016-01-07 ENCOUNTER — Other Ambulatory Visit: Payer: Self-pay | Admitting: Internal Medicine

## 2016-01-08 ENCOUNTER — Other Ambulatory Visit: Payer: Self-pay | Admitting: Family Medicine

## 2016-01-08 DIAGNOSIS — Z13820 Encounter for screening for osteoporosis: Secondary | ICD-10-CM

## 2016-01-10 ENCOUNTER — Telehealth: Payer: Self-pay | Admitting: *Deleted

## 2016-01-10 NOTE — Telephone Encounter (Signed)
She has both, CIC and pelvic floor dysfunction thanks

## 2016-01-10 NOTE — Telephone Encounter (Signed)
Error

## 2016-01-10 NOTE — Telephone Encounter (Signed)
Patient's insurance [name] ( phone [phone] fax [phone]) requires prior authorization (EOC ID number [id number]) for Trulance. Patient has tried Linzess, Amitiza, Miralax and OTC stool softeners for constipation in the past. However, Trulance is only FDA approved for opiod induced constipation and idiopathic chronic constipation. Does patient have idopathic chronic constipation or is it from the pelvic floor disfuction. If pelvic floor dysfunction, she will likely need another medication as Trulance will probably be denied.

## 2016-01-11 ENCOUNTER — Encounter: Payer: Self-pay | Admitting: Family Medicine

## 2016-01-11 ENCOUNTER — Encounter: Payer: Self-pay | Admitting: *Deleted

## 2016-01-11 NOTE — Telephone Encounter (Signed)
I have completed an appeal via phone through [employer]. Spoke with [employer]. Case # [id number]. Decision is to be made within 7 days of appeal today.

## 2016-01-13 ENCOUNTER — Telehealth: Payer: Self-pay | Admitting: Internal Medicine

## 2016-01-13 NOTE — Telephone Encounter (Signed)
Patient is advised that we have already filed an appeal through [facility] and are awaiting a response. She verbalizes understanding and states she just got her denial letter today.

## 2016-01-14 DIAGNOSIS — R040 Epistaxis: Secondary | ICD-10-CM | POA: Diagnosis not present

## 2016-01-14 DIAGNOSIS — D329 Benign neoplasm of meninges, unspecified: Secondary | ICD-10-CM | POA: Diagnosis not present

## 2016-01-16 IMAGING — NM NM GASTRIC EMPTYING
10 series · 10 of 10 positions shown · non-contrast
Comparison: CT abdomen of 06/02/2014

CLINICAL DATA: Bloating, abdominal pain, nausea and vomiting

EXAM:
NUCLEAR MEDICINE GASTRIC EMPTYING SCAN
TECHNIQUE: After oral ingestion of radiolabeled meal, sequential abdominal
images were obtained for 4 hours. Percentage of activity emptying
the stomach calculated at 1 hour, 2 hour, 3 hour, and 4 hours.
RADIOPHARMACEUTICALS:  2.0 Technetium 99-m labeled sulfur colloid

[Series 1: 0 min · 4.14mm/px · 1 of 1 slices shown (1 of 2)]
[im 1/1]
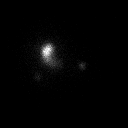

[Series 1: 0 min · 4.14mm/px · 1 of 1 slices shown (2 of 2)]
[im 1/1]
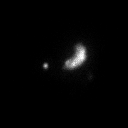

[Series 2: 60 min · 4.14mm/px · 1 of 1 slices shown (1 of 2)]
[im 1/1]
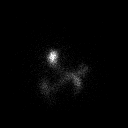

[Series 2: 60 min · 4.14mm/px · 1 of 1 slices shown (2 of 2)]
[im 1/1]
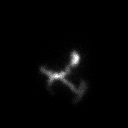

[Series 3: 120 min · 4.14mm/px · 1 of 1 slices shown (1 of 2)]
[im 1/1]
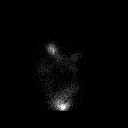

[Series 3: 120 min · 4.14mm/px · 1 of 1 slices shown (2 of 2)]
[im 1/1]
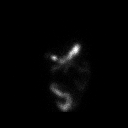

[Series 4: 180 min · 4.14mm/px · 1 of 1 slices shown (1 of 2)]
[im 1/1]
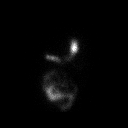

[Series 4: 180 min · 4.14mm/px · 1 of 1 slices shown (2 of 2)]
[im 1/1  full-range]
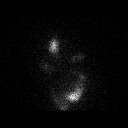

[Series 5: 240 min · 4.14mm/px · 1 of 1 slices shown (1 of 2)]
[im 1/1]
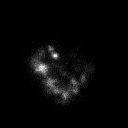

[Series 5: 240 min · 4.14mm/px · 1 of 1 slices shown (2 of 2)]
[im 1/1]
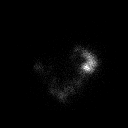

[10 of 10 positions shown; findings below may reference images not displayed]

FINDINGS: Expected location of the stomach in the left upper quadrant.
Ingested meal empties the stomach gradually over the course of the
study.

30% emptied at 1 hr ( normal >= 10%)

58 emptied at 2 hr ( normal >= 40%)

61 emptied at 3 hr ( normal >= 70%)

85 emptied at 4 hr ( normal >= 90%)
IMPRESSION: Delayed gastric emptying study.

## 2016-01-17 ENCOUNTER — Ambulatory Visit (INDEPENDENT_AMBULATORY_CARE_PROVIDER_SITE_OTHER): Payer: Commercial Managed Care - HMO | Admitting: Otolaryngology

## 2016-01-17 DIAGNOSIS — Z794 Long term (current) use of insulin: Secondary | ICD-10-CM | POA: Diagnosis not present

## 2016-01-17 DIAGNOSIS — F17210 Nicotine dependence, cigarettes, uncomplicated: Secondary | ICD-10-CM | POA: Diagnosis not present

## 2016-01-17 DIAGNOSIS — J440 Chronic obstructive pulmonary disease with acute lower respiratory infection: Secondary | ICD-10-CM | POA: Diagnosis not present

## 2016-01-17 DIAGNOSIS — Z9181 History of falling: Secondary | ICD-10-CM | POA: Diagnosis not present

## 2016-01-17 DIAGNOSIS — E119 Type 2 diabetes mellitus without complications: Secondary | ICD-10-CM | POA: Diagnosis not present

## 2016-01-17 DIAGNOSIS — I10 Essential (primary) hypertension: Secondary | ICD-10-CM | POA: Diagnosis not present

## 2016-01-17 DIAGNOSIS — F250 Schizoaffective disorder, bipolar type: Secondary | ICD-10-CM | POA: Diagnosis not present

## 2016-01-17 DIAGNOSIS — I69354 Hemiplegia and hemiparesis following cerebral infarction affecting left non-dominant side: Secondary | ICD-10-CM | POA: Diagnosis not present

## 2016-01-17 DIAGNOSIS — M5010 Cervical disc disorder with radiculopathy, unspecified cervical region: Secondary | ICD-10-CM | POA: Diagnosis not present

## 2016-01-17 DIAGNOSIS — R040 Epistaxis: Secondary | ICD-10-CM

## 2016-01-18 ENCOUNTER — Ambulatory Visit: Payer: Commercial Managed Care - HMO | Attending: Internal Medicine | Admitting: Physical Therapy

## 2016-01-18 ENCOUNTER — Encounter: Payer: Self-pay | Admitting: Physical Therapy

## 2016-01-18 DIAGNOSIS — M6281 Muscle weakness (generalized): Secondary | ICD-10-CM | POA: Insufficient documentation

## 2016-01-18 DIAGNOSIS — R279 Unspecified lack of coordination: Secondary | ICD-10-CM

## 2016-01-18 DIAGNOSIS — M62838 Other muscle spasm: Secondary | ICD-10-CM | POA: Insufficient documentation

## 2016-01-18 NOTE — Therapy (Signed)
[facility] [address], [place], [place], [address] Phone: [phone]   Fax:  [phone]  Physical Therapy Evaluation  Patient Details  Name: [name] MRN: [id number] Date of Birth: 1951-02-27 Referring Provider: Dr. Zenovia Jarred  Encounter Date: 01/18/2016      PT End of Session - 01/18/16 1139    Visit Number 1   Number of Visits 10   Date for PT Re-Evaluation 03/14/16   Authorization Type g-code 10th visit;    PT Start Time 1100   PT Stop Time 1141   PT Time Calculation (min) 41 min   Activity Tolerance Patient tolerated treatment well   Behavior During Therapy [facility] for tasks assessed/performed      Past Medical History:  Diagnosis Date  . Anemia   . Anxiety    takes Ativan daily  . Arthritis   . Bipolar disorder ([place])    takes Risperdal nightly  . Blood transfusion   . Cancer ([place])    In her gum  . Carpal tunnel syndrome of right wrist 05/23/2011  . Cervical disc disorder with radiculopathy of cervical region 10/31/2012  . Chronic back pain   . Chronic idiopathic constipation   . Colon polyps   . COPD (chronic obstructive pulmonary disease) with chronic bronchitis ([place]) 09/16/2013   Office Spirometry 10/30/2013-submaximal effort based on appearance of loop and curve. Numbers would fit with severe restriction but her physiologic capability may be better than this. FVC 0.91/44%, and 10.74/45%, FEV1/FVC 0.81, FEF 25-75% 1.43/69%    . Depression    takes Zoloft daily  . Diabetes mellitus    Type II  . Diverticulosis    TCS 9/08 by Dr. Delfin Edis for diarrhea . Bx for micro scopic colitis negative.   . Fibromyalgia   . Frequent falls   . GERD (gastroesophageal reflux disease)    takes Aciphex daily  . Glaucoma    eye drops daily  . Gum symptoms    infection on antibiotic  . Hemiplegia affecting non-dominant side, post-stroke ([place]) 08/02/2011  . Hyperlipidemia    takes Crestor daily  .  Hypertension    takes Amlodipine,Metoprolol,and Clonidine daily  . Hypothyroidism    takes Synthroid daily  . IBS (irritable bowel syndrome)   . Insomnia    takes Trazodone nightly  . Metabolic encephalopathy [id number]  . Migraines    chronic headaches  . Mononeuritis lower limb   . Osteoporosis   . Pancreatitis 2006   due to Depakote with normal EUS   . Schatzki's ring    non critical / EGD with ED 8/2011with RMR  . Seizures ([place])    takes Lamictal daily.Last seizure 3 yrs ago  . Sleep apnea    on CPAP  . Stroke [facility])    left sided weakness  . Tubular adenoma of colon     Past Surgical History:  Procedure Laterality Date  . ABDOMINAL HYSTERECTOMY  1978  . BACK SURGERY  July 2012  . BACTERIAL OVERGROWTH TEST N/A 05/05/2013   Procedure: BACTERIAL OVERGROWTH TEST;  Surgeon: Daneil Dolin, MD;  [facility];  Service: Endoscopy;  Laterality: N/A;  7:30  . BIOPSY THYROID  2009  . BRAIN SURGERY  11/2011   resection of meningioma  . BREAST REDUCTION SURGERY  1994  . CARDIAC CATHETERIZATION  05/10/2005   normal coronaries, normal LV systolic function and EF (Dr. Jackie Plum)  . CARPAL TUNNEL RELEASE Left 07/22/04   Dr.  Aline Brochure  . CATARACT EXTRACTION Bilateral   . CHOLECYSTECTOMY  1984  . COLONOSCOPY N/A 09/25/2012   [id number] diverticulosis.  colonic polyp-removed : tubular adenoma  . CRANIOTOMY  11/23/2011   Procedure: CRANIOTOMY TUMOR EXCISION;  Surgeon: Hosie Spangle, MD;  Location: [place] NEURO ORS;  Service: Neurosurgery;  Laterality: N/A;  Craniotomy for tumor resection  . ESOPHAGOGASTRODUODENOSCOPY  12/29/2010   Rourk-Retained food in the esophagus and stomach, small hiatal hernia, status post Maloney dilation of the esophagus  . ESOPHAGOGASTRODUODENOSCOPY N/A 09/25/2012   [id number] atonic baggy esophagus status post Maloney dilation 80 F. Hiatal hernia  . GIVENS CAPSULE STUDY N/A 01/15/2013   NORMAL.   [name] LESION REMOVAL N/A 05/31/2015   Procedure: REMOVAL  RIGHT AND LEFT LESIONS OF MANDIBLE;  Surgeon: Diona Browner, DDS;  Location: [place];  Service: Oral Surgery;  Laterality: N/A;  . MALONEY DILATION  12/29/2010   RMR;  . NM MYOCAR PERF WALL MOTION  2006   "relavtiely normal" persantine, mild anterior thinning (breast attenuation artifact), no region of scar/ischemia  . OVARIAN CYST REMOVAL    . RECTOCELE REPAIR N/A 06/29/2015   Procedure: POSTERIOR REPAIR (RECTOCELE);  Surgeon: Jonnie Kind, MD;  Location: [facility] ORS;  Service: Gynecology;  Laterality: N/A;  . SPINE SURGERY  09/29/2010   Dr. Rolena Infante  . surgical excision of 3 tumors from right thigh and right buttock  and left upper thigh  2010  . TOOTH EXTRACTION Bilateral 12/14/2014   Procedure: REMOVAL OF BILATERAL MANDIBULAR EXOSTOSES;  Surgeon: Diona Browner, DDS;  Location: [place];  Service: Oral Surgery;  Laterality: Bilateral;  . TRANSTHORACIC ECHOCARDIOGRAM  2010   EF 60-65%, mild conc LVH, grade 1 diastolic dysfunction; mildly calcified MV annulus with mildly thickened leaflets, mildly calcified MR annulus    There were no vitals filed for this visit.       Subjective Assessment - 01/18/16 1105    Subjective Patient reports 2 years she has to strain her bowels. The last 5 days and strained with no bowel movement. Patient is unable to push the bowels out.     Patient Stated Goals not strain to have a bowel movement   Currently in Pain? Yes   Pain Score 7    Pain Location Abdomen   Pain Orientation Mid;Lower   Pain Descriptors / Indicators Cramping  bloating of abdomen   Pain Type Chronic pain   Pain Onset More than a month ago   Pain Frequency Intermittent   Aggravating Factors  eating, night time,    Pain Relieving Factors having a bowel movement   Multiple Pain Sites No            [facility] PT Assessment - 01/18/16 0001      Assessment   Medical Diagnosis K59.00 constipation, unspecified constipation type; M62.89 Pelvic floor dysfunction   Referring Provider Dr. Ulice Dash Pyrtle    Onset Date/Surgical Date 07/12/15   Prior Therapy None     Precautions   Precautions Other (comment);Fall   Precaution Comments cancer     Restrictions   Weight Bearing Restrictions No     Balance Screen   Has the patient fallen in the past 6 months Yes   How many times? 4  had therapy for her balance, last year   Has the patient had a decrease in activity level because of a fear of falling?  Yes   Is the patient reluctant to leave their home because of a fear of falling?  No  Home Environment   Living Environment Private residence   Living Arrangements Alone   Type of [place] One level     Prior Function   Level of Independence Independent with basic ADLs;Requires assistive device for independence   Vocation On disability   Leisure standing hip exercise; short walks     Cognition   Overall Cognitive Status Within Functional Limits for tasks assessed     Observation/Other Assessments   Focus on Therapeutic Outcomes (FOTO)  70% limitation CL  goal is 55% limitation CK     Posture/Postural Control   Posture/Postural Control Postural limitations   Postural Limitations Rounded Shoulders;Forward head;Decreased lumbar lordosis     ROM / Strength   AROM / PROM / Strength Strength     Strength   Overall Strength Deficits   Overall Strength Comments left sided weakness from stroke; abdominal strength is 1/5     Transfers   Comments takes 2-3 tries to get out of chair, has to use hands, trouble getting to the bathroom due to difficulty getting out of chair and walking     Ambulation/Gait   Ambulation/Gait Yes   Assistive device Straight cane;Rolling walker  uses walker when legs feels numb     Balance   Balance Assessed Yes     Standardized Balance Assessment   Standardized Balance Assessment Timed Up and Go Test     Timed Up and Go Test   TUG Normal TUG   Normal TUG (seconds) 37   TUG Comments high risk of falls; used SPC and chair with  armrest                 Pelvic Floor Special Questions - 01/18/16 0001    Currently Sexually Active No   Urinary Leakage Yes   Pad use 2-3   Activities that cause leaking Other  not sure   Fecal incontinence Yes  does not realize   Skin Integrity Hemorroids   Pelvic Floor Internal Exam Patient comfirms identification and approves PT to assess muscle intergrity    Exam Type Rectal   Sensation decreased rectally   Palpation No pain; needs verbal cues to not bear down   Strength Flicker   Strength # of reps 2   Strength # of seconds 1   Tone low tone                  PT Education - 01/18/16 1136    Education provided Yes   Education Details abdominal massage   Person(s) Educated Patient   Methods Explanation;Demonstration;Verbal cues;Handout   Comprehension Returned demonstration;Verbalized understanding          PT Short Term Goals - 01/18/16 1232      PT SHORT TERM GOAL #1   Title independent with abdominal massage   Time 4   Period Weeks   Status New     PT SHORT TERM GOAL #2   Title ability to strain </= 25% less due to increased pelvic floor strength and improved coordination   Time 4   Period Weeks   Status New     PT SHORT TERM GOAL #3   Title abdominal pain decreased >/= 25% due to ability to have a bowel movement   Time 4   Period Weeks   Status New           PT Long Term Goals - 01/18/16 1416      PT LONG TERM GOAL #1  Title independent with HEP   Time 8   Period Weeks   Status New     PT LONG TERM GOAL #2   Title understand correct toileting technique using abdominal bracing and relaxation of the pelvic floor to not strain with a bowel movement   Time 8   Period Weeks   Status New     PT LONG TERM GOAL #3   Title ability to have a bowel movement with 50% less straining due to increased pelvic floor strength >/= 3/5   Time 8   Period Weeks   Status New     PT LONG TERM GOAL #4   Title abdominal pain decreased  >/= 50% due to bowels able to move through the intestines with greater ease   Time 8   Period Weeks   Status New     PT LONG TERM GOAL #5   Title FOTO score </= 55% limitation   Time 8   Period Weeks   Status New     Additional Long Term Goals   Additional Long Term Goals Yes     PT LONG TERM GOAL #6   Title timed up and go test </=25 sec   Time 8   Period Weeks   Status New               Plan - 01/18/16 1223    Clinical Impression Statement Patient is a 65 year old female with diagnosis of constipation and pelvic floor dysfunction. Patient reports abdominal pain at level 7/10 that is intermittent with cramping and pressure.  Patient pain is worse with sitting and eating.  Patient has not had a bowel movement in 5 days.  Pelvic floor weakness is 1/5 with difficulty coordinating contraction and  not bearing down. When patient goes to have a bowel movement she has to strain due to weakness in rectal area and abdomen.  Abdominal strength is 1/5.  TUG score is 37 seconds indicating risk of falls.  Patient has difficulty getting up and walking to the commode and will soil her pads.  She wears 2-3 pads per day due to urinary leakage.  Patient ambulates with a single point cane.  Patient posture consists with reduced lumbar lordosis, no buttocks tone, protruding abdomen, scoliosis, and posterior tilted pelvis.  Patient is of moderate complexity due to evolving condition and many comorbities that will impact her care.  Patient will benefit from skilled therapy to improve pelvic floor strength, improve coordination of pelvic floor and reduce pain due to being constipated.    Rehab Potential Good   Clinical Impairments Affecting Rehab Potential s/p Craniotomy; rectocele surgery; stroke with left sided weakness; lumbar surger   PT Frequency 1x / week   PT Duration 8 weeks   PT Treatment/Interventions Biofeedback;Electrical Stimulation;Therapeutic activities;Therapeutic exercise;Neuromuscular  re-education;Balance training;Patient/family education;Passive range of motion;Manual techniques;Energy conservation   PT Next Visit Plan abdominal massage; abdominal bracing; pelvic floor contraction; bowel health   PT Home Exercise Plan pelvic floor exercise with assistance   Recommended Other Services None   Consulted and Agree with Plan of Care Patient      Patient will benefit from skilled therapeutic intervention in order to improve the following deficits and impairments:  Decreased coordination, Decreased strength, Decreased activity tolerance, Decreased endurance, Increased muscle spasms, Increased fascial restricitons, Decreased balance  Visit Diagnosis: Muscle weakness (generalized) - Plan: PT plan of care cert/re-cert  Unspecified lack of coordination - Plan: PT plan of care cert/re-cert  Other muscle  spasm - Plan: PT plan of care cert/re-cert      G-Codes - [id number]    Functional Assessment Tool Used FOTO score is 70% limitation  goal is 55% limitation   Functional Limitation Other PT primary   Other PT Primary Current Status [id number]) At least 60 percent but less than 80 percent impaired, limited or restricted   Other PT Primary Goal Status [id number]) At least 40 percent but less than 60 percent impaired, limited or restricted       Problem List Patient Active Problem List   Diagnosis Date Noted  . History of palpitations 08/09/2015  . Nausea without vomiting 08/09/2015  . Labile hypertension 08/03/2015  . Normal coronary arteries 08/03/2015  . Vertigo 07/15/2015  . Rectocele 06/29/2015  . Left-sided low back pain with left-sided sciatica 06/27/2015  . Left flank pain 06/27/2015  . Acute cystitis without hematuria 05/10/2015  . Poorly controlled type 2 diabetes mellitus with circulatory disorder ([place]) 05/06/2015  . Multinodular goiter 05/06/2015  . Rectocele, female 04/27/2015  . Anal sphincter incontinence 04/27/2015  . Pelvic relaxation due to rectocele  03/30/2015  . Pulmonary hypertension 02/22/2015  . Weakness of left upper extremity 02/21/2015  . Light cigarette smoker (1-9 cigarettes per day) 01/11/2015  . Migraine without aura and without status migrainosus, not intractable 07/02/2014  . Flatulence 02/18/2014  . Microcytic anemia 02/18/2014  . COPD (chronic obstructive pulmonary disease) with chronic bronchitis ([place]) 09/16/2013  . Hypothyroidism 08/16/2013  . Gastroparesis 04/28/2013  . Seizure disorder ([place]) 01/19/2013  . Cervical disc disorder with radiculopathy of cervical region 10/31/2012  . Solitary pulmonary nodule 08/19/2012  . Anemia 07/05/2012  . 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 Allergic sinusitis 04/18/2012  . Meningioma ([place]) 11/19/2011  . 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 Hemiplegia affecting non-dominant side, post-stroke ([place]) 08/02/2011  . Carpal tunnel syndrome of right wrist 05/23/2011  . Polypharmacy 04/28/2011  . Bipolar disorder ([place]) 04/28/2011  . Constipation 04/13/2011  . Falls frequently 12/12/2010  . Urinary incontinence 12/16/2009  . HEARING LOSS 10/26/2009  . Hyperlipidemia 12/11/2008  . IBS 12/11/2008  . GERD 07/29/2008  . MILK PRODUCTS ALLERGY 07/29/2008  . 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 Backache 06/19/2007  . Osteoporosis 06/19/2007  . Obstructive sleep apnea 06/19/2007  . TRIGGER FINGER 04/18/2007  . DIVERTICULOSIS, COLON 11/13/2006    Earlie Counts, PT 01/18/16 2:24 PM   [facility] [address] [place], [place], [address] Phone: [phone]   Fax:  [phone]  Name: [name] MRN: [id number] Date of Birth: Mar 06, 1951

## 2016-01-18 NOTE — Patient Instructions (Signed)
About Abdominal Massage  Abdominal massage, also called external colon massage, is a self-treatment circular massage technique that can reduce and eliminate gas and ease constipation. The colon naturally contracts in waves in a clockwise direction starting from inside the right hip, moving up toward the ribs, across the belly, and down inside the left hip.  When you perform circular abdominal massage, you help stimulate your colon's normal wave pattern of movement called peristalsis.  It is most beneficial when done after eating.  Positioning You can practice abdominal massage with oil while lying down, or in the shower with soap.  Some people find that it is just as effective to do the massage through clothing while sitting or standing.  How to Massage Start by placing your finger tips or knuckles on your right side, just inside your hip bone.  . Make small circular movements while you move upward toward your rib cage.   . Once you reach the bottom right side of your rib cage, take your circular movements across to the left side of the bottom of your rib cage.  . Next, move downward until you reach the inside of your left hip bone.  This is the path your feces travel in your colon. . Continue to perform your abdominal massage in this pattern for 10 minutes each day.     You can apply as much pressure as is comfortable in your massage.  Start gently and build pressure as you continue to practice.  Notice any areas of pain as you massage; areas of slight pain may be relieved as you massage, but if you have areas of significant or intense pain, consult with your healthcare provider.  Other Considerations . General physical activity including bending and stretching can have a beneficial massage-like effect on the colon.  Deep breathing can also stimulate the colon because breathing deeply activates the same nervous system that supplies the colon.   . Abdominal massage should always be used in  combination with a bowel-conscious diet that is high in the proper type of fiber for you, fluids (primarily water), and a regular exercise program. [facility] [address], [address] [place], [place] [address] Phone # [phone] Fax [phone]  

## 2016-01-19 ENCOUNTER — Ambulatory Visit (HOSPITAL_COMMUNITY)
Admission: RE | Admit: 2016-01-19 | Discharge: 2016-01-19 | Disposition: A | Discharge status: Home / Self Care | Payer: Commercial Managed Care - HMO | Source: Ambulatory Visit | Attending: Family Medicine | Admitting: Family Medicine

## 2016-01-19 DIAGNOSIS — M8589 Other specified disorders of bone density and structure, multiple sites: Secondary | ICD-10-CM | POA: Diagnosis not present

## 2016-01-19 DIAGNOSIS — Z13820 Encounter for screening for osteoporosis: Secondary | ICD-10-CM

## 2016-01-19 DIAGNOSIS — M85851 Other specified disorders of bone density and structure, right thigh: Secondary | ICD-10-CM | POA: Diagnosis not present

## 2016-01-19 NOTE — Telephone Encounter (Signed)
Per [employer], denial for [employer] has been overturned and is now approved from 01/13/16-03/12/16. Reference number [id number].

## 2016-01-20 ENCOUNTER — Other Ambulatory Visit (HOSPITAL_COMMUNITY)
Admission: RE | Admit: 2016-01-20 | Discharge: 2016-01-20 | Disposition: A | Discharge status: Home / Self Care | Payer: Commercial Managed Care - HMO | Source: Ambulatory Visit | Attending: Family Medicine | Admitting: Family Medicine

## 2016-01-20 ENCOUNTER — Encounter: Payer: Self-pay | Admitting: Family Medicine

## 2016-01-20 ENCOUNTER — Ambulatory Visit (INDEPENDENT_AMBULATORY_CARE_PROVIDER_SITE_OTHER): Payer: Commercial Managed Care - HMO | Admitting: Family Medicine

## 2016-01-20 VITALS — BP 138/70 | HR 63 | Resp 16 | Ht 59.0 in | Wt 163.0 lb

## 2016-01-20 DIAGNOSIS — Z0000 Encounter for general adult medical examination without abnormal findings: Secondary | ICD-10-CM

## 2016-01-20 DIAGNOSIS — I10 Essential (primary) hypertension: Secondary | ICD-10-CM

## 2016-01-20 DIAGNOSIS — R296 Repeated falls: Secondary | ICD-10-CM

## 2016-01-20 DIAGNOSIS — Z124 Encounter for screening for malignant neoplasm of cervix: Secondary | ICD-10-CM | POA: Diagnosis not present

## 2016-01-20 DIAGNOSIS — M8580 Other specified disorders of bone density and structure, unspecified site: Secondary | ICD-10-CM | POA: Insufficient documentation

## 2016-01-20 DIAGNOSIS — Z1211 Encounter for screening for malignant neoplasm of colon: Secondary | ICD-10-CM | POA: Diagnosis not present

## 2016-01-20 DIAGNOSIS — E1159 Type 2 diabetes mellitus with other circulatory complications: Secondary | ICD-10-CM

## 2016-01-20 DIAGNOSIS — R0989 Other specified symptoms and signs involving the circulatory and respiratory systems: Secondary | ICD-10-CM

## 2016-01-20 DIAGNOSIS — E785 Hyperlipidemia, unspecified: Secondary | ICD-10-CM

## 2016-01-20 DIAGNOSIS — E1165 Type 2 diabetes mellitus with hyperglycemia: Secondary | ICD-10-CM

## 2016-01-20 LAB — POC HEMOCCULT BLD/STL (OFFICE/1-CARD/DIAGNOSTIC): Fecal Occult Blood, POC: NEGATIVE

## 2016-01-20 NOTE — Patient Instructions (Signed)
F/u in 3 month, call if you need me before   You are referred for in home PT twice weekly for 6 week due to unsteady gait and feeling of imbalance  Lipid, cmp an EGFR, TSH, cBC, vit D   You need to start medication for thinning of the bones which has worsened  Thank you  for choosing [facility]. We consider it a privelige to serve you.  Delivering excellent health care in a caring and  compassionate way is our goal.  Partnering with you,  so that together we can achieve this goal is our strategy.Thanks for choosing [facility], we consider it a privelige to serve you.

## 2016-01-20 NOTE — Assessment & Plan Note (Signed)
Reports increased instability, and feels as though she will fall forward on bending forward/. Significant spine disease, with weakness and muscle wasting. Needs in home PT twice weekly for 6 weeks

## 2016-01-20 NOTE — Progress Notes (Signed)
    [name]     MRN: [id number]      DOB: 04/27/1950  HPI: Patient is in for annual physical exam. Recent dexa shows worsening osteopenia, she is at high risk for falls due to past fall and unsteady gait. Needs in home PT twice weekly for 6 weeks, reports that she often feels that she will fall over when she bends forward, ambulates with a cane , and has recently been advised to keep a walker at her bedside. Labs today, and intend to start reclast for osteopenia treatment in renal function allows PE: Pleasant  female, alert and oriented x 3, in no cardio-pulmonary distress. Afebrile. HEENT No facial trauma or asymetry. Sinuses non tender.  Extra occullar muscles intact,External ears normal, tympanic membranes clear. Oropharynx moist, no exudate. Neck: supple, no adenopathy,JVD or thyromegaly.No bruits.  Chest: Clear to ascultation bilaterally.No crackles or wheezes. Decreased though adequate air entry Non tender to palpation  Breast: No asymetry,no masses or lumps. No tenderness. No nipple discharge or inversion. No axillary or supraclavicular adenopathy  Cardiovascular system; Heart sounds normal,  S1 and  S2 ,no S3.  No murmur, or thrill. Apical beat not displaced Peripheral pulses normal.  Abdomen:diffuse superficial  tenderness, no organomegaly or masses. No bruits. Bowel sounds normal. No guarding,  or rebound.  Rectal:  Loose sphincter tone. No rectal mass. Guaiac negative stool.  GU: External genitalia normal female genitalia , normal female distribution of hair. No lesions. Urethral meatus normal in size, no  Prolapse, no lesions visibly  Present. Bladder non tender. Vagina pink and moist , with no visible lesions , discharge present . Inadequate pelvic support  rectocele corrected   Uterus absent, no adnexal masses, no  adnexal tenderness.   Musculoskeletal exam: Decreased  ROM of spine, hips , shoulders and knees.   muscle wasting present.    Neurologic: Cranial nerves 2 to 12 intact. Power and  tone , decreased in all 4 extremities, sensation and reflexes normal throughout. Abnormal gait. No tremor.  Skin: Intact, no ulceration, erythema , scaling or rash noted. Pigmentation normal throughout  Psych; Normal mood and affect. Judgement and concentration normal   Assessment & Plan:  Annual physical exam Annual exam as documented. Counseling done  re healthy lifestyle involving commitment to 150 minutes exercise per week, heart healthy diet, and attaining healthy weight.The importance of adequate sleep also discussed. Regular seat belt use and home safety, is also discussed. Changes in health habits are decided on by the patient with goals and time frames  set for achieving them. Immunization and cancer screening needs are specifically addressed at this visit.   Falls frequently Reports increased instability, and feels as though she will fall forward on bending forward/. Significant spine disease, with weakness and muscle wasting. Needs in home PT twice weekly for 6 weeks  Osteopenia Check vit D level and renal function , p[lan is annual reclast, has gERD

## 2016-01-20 NOTE — Addendum Note (Signed)
Addended by: Eual Fines on: 01/20/2016 12:59 PM   Modules accepted: Orders

## 2016-01-20 NOTE — Assessment & Plan Note (Signed)
Annual exam as documented. Counseling done  re healthy lifestyle involving commitment to 150 minutes exercise per week, heart healthy diet, and attaining healthy weight.The importance of adequate sleep also discussed. Regular seat belt use and home safety, is also discussed. Changes in health habits are decided on by the patient with goals and time frames  set for achieving them. Immunization and cancer screening needs are specifically addressed at this visit.  

## 2016-01-20 NOTE — Assessment & Plan Note (Signed)
Check vit D level and renal function , p[lan is annual reclast, has gERD

## 2016-01-21 LAB — CYTOLOGY - PAP: DIAGNOSIS: NEGATIVE

## 2016-01-22 ENCOUNTER — Other Ambulatory Visit: Payer: Self-pay | Admitting: Family Medicine

## 2016-01-22 DIAGNOSIS — I10 Essential (primary) hypertension: Secondary | ICD-10-CM | POA: Diagnosis not present

## 2016-01-22 DIAGNOSIS — E785 Hyperlipidemia, unspecified: Secondary | ICD-10-CM | POA: Diagnosis not present

## 2016-01-24 DIAGNOSIS — F17210 Nicotine dependence, cigarettes, uncomplicated: Secondary | ICD-10-CM

## 2016-01-24 DIAGNOSIS — Z794 Long term (current) use of insulin: Secondary | ICD-10-CM

## 2016-01-24 DIAGNOSIS — Z7982 Long term (current) use of aspirin: Secondary | ICD-10-CM

## 2016-01-24 DIAGNOSIS — I10 Essential (primary) hypertension: Secondary | ICD-10-CM | POA: Diagnosis not present

## 2016-01-24 DIAGNOSIS — F250 Schizoaffective disorder, bipolar type: Secondary | ICD-10-CM

## 2016-01-24 DIAGNOSIS — J440 Chronic obstructive pulmonary disease with acute lower respiratory infection: Secondary | ICD-10-CM | POA: Diagnosis not present

## 2016-01-24 DIAGNOSIS — I69354 Hemiplegia and hemiparesis following cerebral infarction affecting left non-dominant side: Secondary | ICD-10-CM | POA: Diagnosis not present

## 2016-01-24 DIAGNOSIS — M5010 Cervical disc disorder with radiculopathy, unspecified cervical region: Secondary | ICD-10-CM

## 2016-01-24 DIAGNOSIS — Z9181 History of falling: Secondary | ICD-10-CM

## 2016-01-24 DIAGNOSIS — E119 Type 2 diabetes mellitus without complications: Secondary | ICD-10-CM | POA: Diagnosis not present

## 2016-01-25 ENCOUNTER — Ambulatory Visit (INDEPENDENT_AMBULATORY_CARE_PROVIDER_SITE_OTHER): Payer: Commercial Managed Care - HMO | Admitting: Psychiatry

## 2016-01-25 ENCOUNTER — Encounter (HOSPITAL_COMMUNITY): Payer: Self-pay | Admitting: Psychiatry

## 2016-01-25 DIAGNOSIS — F331 Major depressive disorder, recurrent, moderate: Secondary | ICD-10-CM | POA: Diagnosis not present

## 2016-01-25 LAB — COMPREHENSIVE METABOLIC PANEL
ALK PHOS: 124 IU/L — AB (ref 39–117)
ALT: 22 IU/L (ref 0–32)
AST: 19 IU/L (ref 0–40)
Albumin/Globulin Ratio: 1.6 (ref 1.2–2.2)
Albumin: 4.4 g/dL (ref 3.6–4.8)
BILIRUBIN TOTAL: 0.3 mg/dL (ref 0.0–1.2)
BUN/Creatinine Ratio: 24 (ref 12–28)
BUN: 23 mg/dL (ref 8–27)
CHLORIDE: 104 mmol/L (ref 96–106)
CO2: 22 mmol/L (ref 18–29)
Calcium: 9.6 mg/dL (ref 8.7–10.3)
Creatinine, Ser: 0.94 mg/dL (ref 0.57–1.00)
GFR calc Af Amer: 74 mL/min/{1.73_m2} (ref >59)
GFR calc non Af Amer: 64 mL/min/{1.73_m2} (ref >59)
GLUCOSE: 147 mg/dL — AB (ref 65–99)
Globulin, Total: 2.7 g/dL (ref 1.5–4.5)
Potassium: 3.8 mmol/L (ref 3.5–5.2)
Sodium: 144 mmol/L (ref 134–144)
Total Protein: 7.1 g/dL (ref 6.0–8.5)

## 2016-01-25 LAB — LIPID PANEL W/O CHOL/HDL RATIO
Cholesterol, Total: 159 mg/dL (ref 100–199)
HDL: 48 mg/dL (ref >39)
LDL CALC: 73 mg/dL (ref 0–99)
TRIGLYCERIDES: 190 mg/dL — AB (ref 0–149)
VLDL Cholesterol Cal: 38 mg/dL (ref 5–40)

## 2016-01-25 LAB — CBC
HEMATOCRIT: 36.6 % (ref 34.0–46.6)
Hemoglobin: 10.8 g/dL — ABNORMAL LOW (ref 11.1–15.9)
MCH: 22 pg — ABNORMAL LOW (ref 26.6–33.0)
MCHC: 29.5 g/dL — ABNORMAL LOW (ref 31.5–35.7)
MCV: 75 fL — ABNORMAL LOW (ref 79–97)
Platelets: 218 10*3/uL (ref 150–379)
RBC: 4.91 x10E6/uL (ref 3.77–5.28)
RDW: 15.3 % (ref 12.3–15.4)
WBC: 6.5 10*3/uL (ref 3.4–10.8)

## 2016-01-25 LAB — VITAMIN D 25 HYDROXY (VIT D DEFICIENCY, FRACTURES): Vit D, 25-Hydroxy: 44.8 ng/mL (ref 30.0–100.0)

## 2016-01-25 LAB — TSH: TSH: 0.906 u[IU]/mL (ref 0.450–4.500)

## 2016-01-25 NOTE — Progress Notes (Signed)
Patient:  [name]   DOB: Aug 10, 1950  MR Number: [id number]  Location: [facility]:  [address] [place],  [place], [address]  Start: Tuesday 01/25/2016 11:15 AM End: Tuesday 01/25/2916 11:59 AM  Provider/Observer:     Maurice Small, MSW, LCSW   Chief Complaint:     Depression  Reason For Service:     [name] is a 65 y.o. female who presents with a long standing history of recurrent periods of depression beginning when she was 85 and her favorite uncle died. She has been experiencing increased symptoms of depression recently due to stress in the relationship with her son. Per patient's report, he rarely visits her anymore due to his involvement with an older woman who is controlling. She also reports frustration regarding her knees who is her power of attorney and is very demanding per patient's report. She reports issues with other family members and states she needs help dealing with her family Patient reports multiple psychiatric hospitlaizations due to depression and suicidal ideations with thel last one occuring in 1997. Patient has participated in outpatient psychotherapy and medication management intermittently since age 70.  She currently is seeing psychiatrist Dr. Harrington Challenger . Prior to this, she was being seen at [facility]. Patient also has had ECT at [facility].    Interventions Strategy:  Supportive  Participation Level:   Active  Participation Quality:  Appropriate      Behavioral Observation:  Casual, Alert, and Tearful.   Current Psychosocial Factors: Tension in relationship with son, multiple health issues  Content of Session:   Establish rapport, reviewed symptoms, facilitated expression of feelings, began to discuss patient's support system and ways to use, began to identify coping statements discussed spirituality as coping tool  Current Status:   Depressed mood, anxiety, excessive worry  Suicidal/Homicidal:    No   Patient Progress:   Fair.  Patient reports ups and down since last session. She continues to express disappointment and frustration regarding her son and his relationship with her grandson. She expresses sadness and frustration and says son rarely visits her but does peak her up for visits at his home in [place]. She also reports she attends church on Sundays and Wednesdays. She reports support from her sister-in-law and her pastor.  Target Goals:   1. Establish rapport. 2. Learn and implement behavioral strategies to overcome depression.  Last Reviewed:     Goals Addressed Today:    1,2  Plan:      Return again in 2 weeks.  Impression/Diagnosis:   Patient presents with long standing history of recurrent periods  of depression beginning when she was [age] and her favorite uncle died. Patient reports multiple psychiatric hospitlaizations due to depression and suicidal ideations with thel last one occuring in 1997. Patient has participated in outpatient psychotherapy and medication management intermittently since age 74.  She currently is seeing psychiatrist Dr. Harrington Challenger . Prior to this, she was being seen at [facility]. Patient also has had ECT at [facility]. Symptoms have worsened in recent months due to family stress. Current symptoms include depressed mood, anxiety, excessive worry, and tearfulness.   Diagnosis:  Axis I: MDD recurrent episode, moderate          Axis II: Deferred     Ronte Parker, LCSW 01/25/2016

## 2016-01-31 ENCOUNTER — Other Ambulatory Visit: Payer: Self-pay | Admitting: Internal Medicine

## 2016-01-31 ENCOUNTER — Other Ambulatory Visit (HOSPITAL_COMMUNITY): Payer: Self-pay | Admitting: Psychiatry

## 2016-01-31 DIAGNOSIS — J449 Chronic obstructive pulmonary disease, unspecified: Secondary | ICD-10-CM | POA: Diagnosis not present

## 2016-01-31 DIAGNOSIS — I69398 Other sequelae of cerebral infarction: Secondary | ICD-10-CM | POA: Diagnosis not present

## 2016-01-31 DIAGNOSIS — R2681 Unsteadiness on feet: Secondary | ICD-10-CM | POA: Diagnosis not present

## 2016-01-31 DIAGNOSIS — F250 Schizoaffective disorder, bipolar type: Secondary | ICD-10-CM | POA: Diagnosis not present

## 2016-01-31 DIAGNOSIS — F17210 Nicotine dependence, cigarettes, uncomplicated: Secondary | ICD-10-CM | POA: Diagnosis not present

## 2016-01-31 DIAGNOSIS — E119 Type 2 diabetes mellitus without complications: Secondary | ICD-10-CM | POA: Diagnosis not present

## 2016-01-31 DIAGNOSIS — M6281 Muscle weakness (generalized): Secondary | ICD-10-CM | POA: Diagnosis not present

## 2016-01-31 DIAGNOSIS — I10 Essential (primary) hypertension: Secondary | ICD-10-CM | POA: Diagnosis not present

## 2016-01-31 DIAGNOSIS — M545 Low back pain: Secondary | ICD-10-CM | POA: Diagnosis not present

## 2016-02-01 DIAGNOSIS — F17210 Nicotine dependence, cigarettes, uncomplicated: Secondary | ICD-10-CM | POA: Diagnosis not present

## 2016-02-01 DIAGNOSIS — M6281 Muscle weakness (generalized): Secondary | ICD-10-CM | POA: Diagnosis not present

## 2016-02-01 DIAGNOSIS — I10 Essential (primary) hypertension: Secondary | ICD-10-CM | POA: Diagnosis not present

## 2016-02-01 DIAGNOSIS — F250 Schizoaffective disorder, bipolar type: Secondary | ICD-10-CM | POA: Diagnosis not present

## 2016-02-01 DIAGNOSIS — R32 Unspecified urinary incontinence: Secondary | ICD-10-CM | POA: Diagnosis not present

## 2016-02-01 DIAGNOSIS — R2681 Unsteadiness on feet: Secondary | ICD-10-CM | POA: Diagnosis not present

## 2016-02-01 DIAGNOSIS — M150 Primary generalized (osteo)arthritis: Secondary | ICD-10-CM | POA: Diagnosis not present

## 2016-02-01 DIAGNOSIS — I639 Cerebral infarction, unspecified: Secondary | ICD-10-CM | POA: Diagnosis not present

## 2016-02-01 DIAGNOSIS — E119 Type 2 diabetes mellitus without complications: Secondary | ICD-10-CM | POA: Diagnosis not present

## 2016-02-01 DIAGNOSIS — J449 Chronic obstructive pulmonary disease, unspecified: Secondary | ICD-10-CM | POA: Diagnosis not present

## 2016-02-01 DIAGNOSIS — D329 Benign neoplasm of meninges, unspecified: Secondary | ICD-10-CM | POA: Diagnosis not present

## 2016-02-01 DIAGNOSIS — M545 Low back pain: Secondary | ICD-10-CM | POA: Diagnosis not present

## 2016-02-01 DIAGNOSIS — I69398 Other sequelae of cerebral infarction: Secondary | ICD-10-CM | POA: Diagnosis not present

## 2016-02-02 ENCOUNTER — Encounter: Payer: Self-pay | Admitting: Internal Medicine

## 2016-02-02 ENCOUNTER — Ambulatory Visit (INDEPENDENT_AMBULATORY_CARE_PROVIDER_SITE_OTHER): Payer: Commercial Managed Care - HMO | Admitting: Internal Medicine

## 2016-02-02 VITALS — BP 118/62 | HR 69 | Ht 59.0 in | Wt 162.6 lb

## 2016-02-02 DIAGNOSIS — E1159 Type 2 diabetes mellitus with other circulatory complications: Secondary | ICD-10-CM

## 2016-02-02 DIAGNOSIS — E1165 Type 2 diabetes mellitus with hyperglycemia: Secondary | ICD-10-CM

## 2016-02-02 DIAGNOSIS — E039 Hypothyroidism, unspecified: Secondary | ICD-10-CM | POA: Diagnosis not present

## 2016-02-02 DIAGNOSIS — E042 Nontoxic multinodular goiter: Secondary | ICD-10-CM

## 2016-02-02 LAB — POCT GLYCOSYLATED HEMOGLOBIN (HGB A1C): Hemoglobin A1C: 6.3

## 2016-02-02 MED ORDER — INSULIN GLARGINE 300 UNIT/ML ~~LOC~~ SOPN: 25.0000 [IU] | PEN_INJECTOR | Freq: Every day | SUBCUTANEOUS | 3 refills | Status: DC

## 2016-02-02 MED ORDER — LEVOTHYROXINE SODIUM 50 MCG PO TABS: ORAL_TABLET | ORAL | 3 refills | Status: DC

## 2016-02-02 NOTE — Patient Instructions (Addendum)
Please continue: - Toujeo 25 units at bedtime - Novolog:  17 units with a smaller meal  24 units with a larger meal or with lunch  Please return in 3 months with your sugar log.   Please continue Levothyroxine 50 mcg daily except on Sunday, 25 mcg.  Take the thyroid hormone every day, with water, at least 30 minutes before breakfast, separated by at least 4 hours from: - acid reflux medications - calcium - iron - multivitamins  Please return in 4 months with your sugar log.

## 2016-02-02 NOTE — Progress Notes (Signed)
Patient ID: [name], female   DOB: 1951/01/24, 65 y.o.   MRN: [id number]  HPI: [name] is a 65 y.o.-year-old female, initially referred by her PCP, [name], for management of DM2, dx 1995, insulin-dependent since 1997, uncontrolled, with complications (gastroparesis, cerebrovasc. Ds-  H/o stroke, PN),  Hypothyroidism, thyroid nodules.  Last visit 3 mo ago.  DM2: Last hemoglobin A1c was: Lab Results  Component Value Date   HGBA1C 6.6 11/01/2015   HGBA1C 6.2 (H) 05/31/2015   HGBA1C 6.4 (H) 02/21/2015  10/28/2013: HbA1c 8.5%  She is now on: - Toujeo 25 units at bedtime - Novolog:  17 units with a smaller meal  24 units with a larger meal or with lunch Could not tolerate Metformin >> nausea.   Pt checks her sugars 4x a day and they are: - am: \36, 41, 52-100, 122 >> 115-139, 158 >> 100-190, 201 >> 85-135, 140 >> 64-160, 218, 316 - 2h after b'fast: n/c  - before lunch:  129-154, 229 >> 44x1, 82-162, 201 >> 98-130, 148 >> 63-140, 160, 297 - 2h after lunch: n/c >> 127-154, 229 >> n/c - before dinner:  47-133, 243 >> 125, 212, 289 (may forget ins) >> 81-179, 287 >> 76, 89-110, 178 >> 66-160, 205, 229 - 2h after dinner: n/c - bedtime:  98-175 >> 54-150, 269 >> 64x1, 127-242 >> 57, 90-302 >> 100-160 >> 79, 120-180, 201, 301 - nighttime: n/c No lows. Lowest sugar was 44x1 >> 76 >> 63; she has hypoglycemia awareness at 70.  Highest sugar was 332 >> 319.  Pt's meals are: - Breakfast: eggs, cereals, fruit, oatmeal, bacon - Lunch: sandwich, beef, mashed potatoes,diet soda - Dinner: chicken, beef, fish, potatoes, vegetables - Snacks: 1-2: fruit She exercises 1x a day - walks.  - + mild CKD, last BUN/creatinine:  Lab Results  Component Value Date   BUN 23 01/22/2016   CREATININE 0.94 01/22/2016  On Losartan. - last set of lipids: Lab Results  Component Value Date   CHOL 159 01/22/2016   HDL 48 01/22/2016   LDLCALC 73 01/22/2016   TRIG 190 (H) 01/22/2016   CHOLHDL 3.1  07/19/2015  On Ezetimibe + Crestor. - last eye exam was in 09/2015. No DR. Had cataract sx B. Now seeing Dr. Hassell Done [facility]). - + numbness and tingling in her leg L leg (affected by stroke) but also in R. Sees podiatry.  Hypothyroidism: - dx > 10 years ago. - On Levoxyl 50 mcg 6/7 days and 25 1/7 days.  - in am (at 7 am) - fasting - with water - >30 min b/f b'fast - no Ca - no Fe - + MVI at noon - + Aciphex moved at noon  Last TSH: Lab Results  Component Value Date   TSH 0.906 01/22/2016   TSH 1.030 11/03/2015   TSH 1.207 02/21/2015   TSH 1.352 11/09/2014   TSH 0.28 (L) 09/21/2014   She has a h/o thyroid nodules. + dysphagia, + hoarseness.  Reviewed thyroid U/S (10/2013):  Right thyroid lobe: 3.7 x 1.0 x 1.8 cm   Left thyroid lobe: 3.5 x 1.5 x 1.6 cm   Isthmus: 0.5 cm   Focal nodules: There is a 0.3 mm calcified nodule in the right midzone. There is a 0.6 x 0.7 x 0.5 cm hypoechoic nodule in the  lateral aspect of the right midzone. There is a 0.7 x 0.6 x 0.5 cm hyperechoic nodule in the lateral aspect of the right lower pole.  There is a 2.2 x 1.1 x 1.7 cm complex solid nodule in the inferior aspect of the isthmus extending adjacent to the left lower pole.  There is a 1.4 x 2.1 x 1.4 cm complex solid nodule in the left lower pole.   Lymphadenopathy: None visualized.  Repeat U/S (01/2014): stable appearance of nodules  FNA (2014) x2: benign  Repeat U/S (10/2015): stable appearance of nodules  She had a L rib fracture in 12/2014.  ROS: Constitutional: no weight loss, no fatigue, + subjective hypothermia, + nocturia Eyes: no blurry vision, no xerophthalmia ENT: no sore throat, no nodules palpated in throat, no dysphagia/no odynophagia, no hoarseness Cardiovascular: no CP/+ SOB/no palpitations/+ leg swelling Respiratory: no cough/+ SOB Gastrointestinal: no N/V/D/C Musculoskeletal: no muscle/no joint aches Skin: no rashes Neurological: no  tremors/numbness/tingling/dizziness  I reviewed pt's medications, allergies, PMH, social hx, family hx, and changes were documented in the history of present illness. Otherwise, unchanged from my initial visit note - except stopped Remeron, started Risperdal: Past Medical History:  Diagnosis Date  . Anemia   . Anxiety    takes Ativan daily  . Arthritis   . Bipolar disorder ([place])    takes Risperdal nightly  . Blood transfusion   . Cancer ([place])    In her gum  . Carpal tunnel syndrome of right wrist 05/23/2011  . Cervical disc disorder with radiculopathy of cervical region 10/31/2012  . Chronic back pain   . Chronic idiopathic constipation   . Colon polyps   . COPD (chronic obstructive pulmonary disease) with chronic bronchitis ([place]) 09/16/2013   Office Spirometry 10/30/2013-submaximal effort based on appearance of loop and curve. Numbers would fit with severe restriction but her physiologic capability may be better than this. FVC 0.91/44%, and 10.74/45%, FEV1/FVC 0.81, FEF 25-75% 1.43/69%    . Depression    takes Zoloft daily  . Diabetes mellitus    Type II  . Diverticulosis    TCS 9/08 by Dr. Delfin Edis for diarrhea . Bx for micro scopic colitis negative.   . Fibromyalgia   . Frequent falls   . GERD (gastroesophageal reflux disease)    takes Aciphex daily  . Glaucoma    eye drops daily  . Gum symptoms    infection on antibiotic  . Hemiplegia affecting non-dominant side, post-stroke ([place]) 08/02/2011  . Hyperlipidemia    takes Crestor daily  . Hypertension    takes Amlodipine,Metoprolol,and Clonidine daily  . Hypothyroidism    takes Synthroid daily  . IBS (irritable bowel syndrome)   . Insomnia    takes Trazodone nightly  . Metabolic encephalopathy 04/14/5425  . Migraines    chronic headaches  . Mononeuritis lower limb   . Osteoporosis   . Pancreatitis 2006   due to Depakote with normal EUS   . Schatzki's ring    non critical / EGD with ED 8/2011with RMR  . Seizures  ([place])    takes Lamictal daily.Last seizure 3 yrs ago  . Sleep apnea    on CPAP  . Stroke [facility])    left sided weakness  . Tubular adenoma of colon    Past Surgical History:  Procedure Laterality Date  . ABDOMINAL HYSTERECTOMY  1978  . BACK SURGERY  July 2012  . BACTERIAL OVERGROWTH TEST N/A 05/05/2013   Procedure: BACTERIAL OVERGROWTH TEST;  Surgeon: Daneil Dolin, MD;  [facility];  Service: Endoscopy;  Laterality: N/A;  7:30  . BIOPSY THYROID  2009  . BRAIN SURGERY  11/2011  resection of meningioma  . BREAST REDUCTION SURGERY  1994  . CARDIAC CATHETERIZATION  05/10/2005   normal coronaries, normal LV systolic function and EF (Dr. Jackie Plum)  . CARPAL TUNNEL RELEASE Left 07/22/04   Dr. Aline Brochure  . CATARACT EXTRACTION Bilateral   . CHOLECYSTECTOMY  1984  . COLONOSCOPY N/A 09/25/2012   [id number] diverticulosis.  colonic polyp-removed : tubular adenoma  . CRANIOTOMY  11/23/2011   Procedure: CRANIOTOMY TUMOR EXCISION;  Surgeon: Hosie Spangle, MD;  Location: [facility];  Service: Neurosurgery;  Laterality: N/A;  Craniotomy for tumor resection  . ESOPHAGOGASTRODUODENOSCOPY  12/29/2010   Rourk-Retained food in the esophagus and stomach, small hiatal hernia, status post Maloney dilation of the esophagus  . ESOPHAGOGASTRODUODENOSCOPY N/A 09/25/2012   [id number] atonic baggy esophagus status post Maloney dilation 40 F. Hiatal hernia  . GIVENS CAPSULE STUDY N/A 01/15/2013   NORMAL.   [name] LESION REMOVAL N/A 05/31/2015   Procedure: REMOVAL RIGHT AND LEFT LESIONS OF MANDIBLE;  Surgeon: Diona Browner, DDS;  Location: [facility];  Service: Oral Surgery;  Laterality: N/A;  . MALONEY DILATION  12/29/2010   RMR;  . NM MYOCAR PERF WALL MOTION  2006   "relavtiely normal" persantine, mild anterior thinning (breast attenuation artifact), no region of scar/ischemia  . OVARIAN CYST REMOVAL    . RECTOCELE REPAIR N/A 06/29/2015   Procedure: POSTERIOR REPAIR (RECTOCELE);  Surgeon: Jonnie Kind, MD;  Location: [facility];  Service: Gynecology;  Laterality: N/A;  . SPINE SURGERY  09/29/2010   Dr. Rolena Infante  . surgical excision of 3 tumors from right thigh and right buttock  and left upper thigh  2010  . TOOTH EXTRACTION Bilateral 12/14/2014   Procedure: REMOVAL OF BILATERAL MANDIBULAR EXOSTOSES;  Surgeon: Diona Browner, DDS;  Location: [facility];  Service: Oral Surgery;  Laterality: Bilateral;  . TRANSTHORACIC ECHOCARDIOGRAM  2010   EF 60-65%, mild conc LVH, grade 1 diastolic dysfunction; mildly calcified MV annulus with mildly thickened leaflets, mildly calcified MR annulus   History   Social History  . Marital Status: Divorced    Spouse Name: N/A    Number of Children: 1  . Years of Education: 12   Occupational History  . disabled     Social History Main Topics  . Smoking status: Current Every Day Smoker -- 0.25 packs/day for 7 years    Types: Cigarettes  . Smokeless tobacco: Never Used     Comment: "started back but off now for 3 months" (08/18/13)  . Alcohol Use: No     Comment:    . Drug Use: No   Current Outpatient Prescriptions on File Prior to Visit  Medication Sig Dispense Refill  . ACCU-CHEK FASTCLIX LANCETS MISC Use to test blood sugar 4 times daily. Dx: E10.65 408 each 1  . ACCU-CHEK SMARTVIEW test strip TEST BLOOD SUGAR FOUR TIMES DAILY AS DIRECTED 350 each 2  . albuterol (PROVENTIL HFA;VENTOLIN HFA) 108 (90 BASE) MCG/ACT inhaler Inhale 1 puff into the lungs 3 (three) times daily. 1 Inhaler 2  . Alcohol Swabs (B-D SINGLE USE SWABS REGULAR) PADS Use for injections and glucose testing 8 times daily. Dx: E10.65 900 each 1  . amLODipine (NORVASC) 5 MG tablet TAKE 2 TABLETS BY MOUTH ONCE DAILY. 60 tablet 0  . aspirin EC 81 MG tablet Take 81 mg by mouth daily.    . Azelastine-Fluticasone (DYMISTA) 137-50 MCG/ACT SUSP Place 1 puff into the nose at bedtime. 1 Bottle 0  . BD PEN NEEDLE NANO  U/F 32G X 4 MM MISC USE FOUR TIMES DAILY 360 each 3  . Blood Glucose Calibration  (ACCU-CHEK SMARTVIEW CONTROL) LIQD 1 each by Other route as needed. 1 each 2  . Blood Glucose Monitoring Suppl (ACCU-CHEK NANO SMARTVIEW) W/DEVICE KIT 1 each by Does not apply route daily. Dx: E10.65 1 kit 0  . Cholecalciferol (VITAMIN D) 2000 units CAPS Take 1 capsule by mouth daily.    . clobetasol cream (TEMOVATE) 5.69 % Apply 1 application topically daily.    . cloNIDine (CATAPRES) 0.3 MG tablet TAKE 1 TABLET BY MOUTH EVERY EIGHT HOURS. ONCE AT 8AM, 4PM, AND 12 MIDNIGHT. 90 tablet 3  . diclofenac sodium (VOLTAREN) 1 % GEL Apply 2 g topically daily as needed (Pain). 100 g 2  . dicyclomine (BENTYL) 10 MG capsule TAKE 1 CAPSULE BY MOUTH THREE TIMES DAILY BEFORE MEALS. 90 capsule 3  . hydroxychloroquine (PLAQUENIL) 200 MG tablet Take 200 mg by mouth daily.    . Insulin Glargine (TOUJEO SOLOSTAR) 300 UNIT/ML SOPN Inject 25 Units into the skin at bedtime. 6 pen 1  . lamoTRIgine (LAMICTAL) 100 MG tablet TAKE 1 TABLET BY MOUTH TWICE DAILY. 60 tablet 6  . levothyroxine (SYNTHROID, LEVOTHROID) 50 MCG tablet TAKE 1 TABLET BY MOUTH ONCE DAILY AND 1/2 TABLET ON SUNDAYS. 30 tablet 0  . LORazepam (ATIVAN) 0.5 MG tablet Take 1 tablet (0.5 mg total) by mouth 3 (three) times daily. 90 tablet 2  . losartan (COZAAR) 50 MG tablet Take 1 tablet (50 mg total) by mouth daily. 30 tablet 4  . methocarbamol (ROBAXIN) 500 MG tablet Take 500 mg by mouth daily.    . metoprolol (LOPRESSOR) 50 MG tablet TAKE 1 TABLET BY MOUTH TWICE DAILY. 180 tablet 3  . montelukast (SINGULAIR) 10 MG tablet TAKE ONE TABLET BY MOUTH ONCE DAILY. 30 tablet 3  . Multiple Vitamins-Minerals (ONE-A-DAY 50 PLUS PO) Take 1 tablet by mouth daily.    [name] NOVOLOG FLEXPEN 100 UNIT/ML FlexPen INJECT 16 TO 30 UNITS INTO THE SKIN 3 (THREE) TIMES DAILY WITH MEALS. 90 mL 2  . Nystatin (NYAMYC) 100000 UNIT/GM POWD APPLY TO AFFECTED AREA 4 TIMES DAILY. 30 g 2  . ondansetron (ZOFRAN) 4 MG tablet Take 1 tablet (4 mg total) by mouth every 8 (eight) hours as needed  for nausea or vomiting. 20 tablet 0  . Plecanatide (TRULANCE) 3 MG TABS Take 3 mg by mouth daily. 30 tablet 2  . polyethylene glycol powder (MIRALAX) powder Take 17 g by mouth daily. To prevent constipation 255 g prn  . pregabalin (LYRICA) 75 MG capsule Take 75 mg by mouth 2 (two) times daily.     . RABEprazole (ACIPHEX) 20 MG tablet TAKE 1 TABLET BY MOUTH TWICE DAILY. 60 tablet 5  . RESTASIS 0.05 % ophthalmic emulsion Place 1 drop into both eyes 2 (two) times daily.     . risperiDONE (RISPERDAL) 0.5 MG tablet Take 1 tablet (0.5 mg total) by mouth at bedtime. 30 tablet 3  . rosuvastatin (CRESTOR) 5 MG tablet TAKE 1 TABLET BY MOUTH AT BEDTIME. 30 tablet 3  . sertraline (ZOLOFT) 100 MG tablet Take 2 tablets (200 mg total) by mouth daily. 60 tablet 3  . traZODone (DESYREL) 150 MG tablet Take 2 tablets (300 mg total) by mouth at bedtime. 60 tablet 3  . vitamin B-12 (CYANOCOBALAMIN) 1000 MCG tablet Take 1,000 mcg by mouth daily.    . vitamin E 400 UNIT capsule Take 400 Units by mouth daily.  No current facility-administered medications on file prior to visit.       Allergies  Allergen Reactions  . Cephalexin Hives  . Iron Nausea And Vomiting  . Milk-Related Compounds Other (See Comments)    Doesn't agree with stomach.   . Penicillins Hives    Has patient had a PCN reaction causing immediate rash, facial/tongue/throat swelling, SOB or lightheadedness with hypotension: Yes Has patient had a PCN reaction causing severe rash involving mucus membranes or skin necrosis: No Has patient had a PCN reaction that required hospitalization No Has patient had a PCN reaction occurring within the last 10 years: No If all of the above answers are "NO", then may proceed with Cephalosporin use.   [name] Phenazopyridine Hcl Hives         Family History  Problem Relation Age of Onset  . Heart attack Mother     HTN  . Pneumonia Father   . Kidney failure Father   . Diabetes Father   . Pancreatic cancer  Sister   . Diabetes Brother   . Hypertension Brother   . Diabetes Brother   . Cancer Sister     breast   . Hypertension Son   . Sleep apnea Son   . Cancer Sister     pancreatic  . Stroke Maternal Grandmother   . Heart attack Maternal Grandfather   . Alcohol abuse Maternal Uncle   . Colon cancer Neg Hx   . Anesthesia problems Neg Hx   . Hypotension Neg Hx   . Malignant hyperthermia Neg Hx   . Pseudochol deficiency Neg Hx    PE: BP 118/62   Pulse 69   Ht 4' 11"  (1.499 m)   Wt 162 lb 9.6 oz (73.8 kg)   LMP  (Exact Date)   SpO2 97%   BMI 32.84 kg/m  Body mass index is 32.84 kg/m.  Wt Readings from Last 3 Encounters:  02/02/16 162 lb 9.6 oz (73.8 kg)  01/20/16 163 lb (73.9 kg)  01/05/16 165 lb (74.8 kg)   Constitutional: overweight, in NAD but again appears somnolent Eyes: surgical pupils, EOMI, no exophthalmos ENT: moist mucous membranes, no thyromegaly, large thyroid nodule felt in isthmus, no cervical lymphadenopathy Cardiovascular: RRR, No MRG Respiratory: CTA B Gastrointestinal: abdomen soft, NT, ND, BS+ Musculoskeletal: no deformities, strength intact in all 4 Skin: moist, warm, no rashes Neurological: no tremor with outstretched hands, DTR normal in all 4  ASSESSMENT: 1. DM2, insulin-dependent, uncontrolled, without complications - gastroparesis - cerebrovasc. Ds -  H/o stroke - PN  She was interested in an insulin pump >> discussed about [employer] (given brochure) at a previous visit.  2. Hypothyroidism  3. MNG - Thyroid U/S (10/11/2012):  Right thyroid lobe: 3.7 x 1.0 x 1.8 cm  Left thyroid lobe: 3.5 x 1.5 x 1.6 cm  Isthmus: 0.5 cm  Focal nodules: There is a 0.3 mm calcified nodule in the right midzone. There is a 0.6 x 0.7 x 0.5 cm hypoechoic nodule in the lateral aspect of the right midzone. There is a 0.7 x 0.6 x 0.5 cm hyperechoic nodule in the lateral aspect of the right lower pole. There is a 2.2 x 1.1 x 1.7 cm complex solid nodule in the  inferior aspect of the isthmus extending adjacent to the left lower pole. There is a 1.4 x 2.1 x 1.4 cm complex solid nodule in the left lower pole.  Lymphadenopathy: None visualized.  IMPRESSION: Multi nodular goiter which has markedly progressed since the  prior exam. The dominant nodule at the inferior aspect of the isthmus fits criteria for fine needle aspiration biopsy if not previously Assessed.  - FNA (11/05/2012) x2: benign  - Thyroid U/S (01/20/2014) - felt one nodule enlarging >> new U/S: stable appearance of the nodules, except a small new nodule in isthmus: 1.2 cm   Right thyroid lobe Measurements: 4.4 cm x 1.2 cm x 1.7 cm. Multiple right-sided nodules identified. Each right-sided nodule demonstrates increased echogenicity, with the superior measuring 7 mm -8 mm, most inferior measuring 9 mm - 10 mm. A small, 3 mm focus of calcium with posterior shadowing is evident. There is also a mid nodule measuring 7 mm, which is echogenic.  Left thyroid lobe Measurements: 5.1 cm x 2.1 cm x 2.3 cm. Dominant lesion at the inferior pole of left thyroid is again evident, which has been previously biopsied (10/29/2012). This nodule measures 1.8 cm x 1.7cm x 2.5 cm. (Previous 1.4 cm x 2.1 cm x 1.4 cm)  Isthmus Thickness: 4 mm-5 mm. Isthmic nodule again noted, previously biopsied (10/29/2012). Currently this nodule measures 2.2 cm x 1.4 cm x 1.8 cm. (previous measurement 2.2 cm x 1.1 cm x 1.7 cm). There is a new nodule identified within the isthmus with heterogeneously hyperechoic characteristics. This nodule measures 12 mm x 5.4 mm x 7.2 mm.  Lymphadenopathy: None visualized.  - Thyroid U/S (11/09/2015): Details   Reading Physician Reading Date Result Priority  Sandi Mariscal, MD 11/09/2015   Narrative    CLINICAL DATA: Follow-up thyroid nodules.  EXAM: THYROID ULTRASOUND  TECHNIQUE: Ultrasound examination of the thyroid gland and adjacent soft tissues was performed.  COMPARISON:  Thyroid ultrasound - 01/20/2014 ; 10/11/2012  FINDINGS: Parenchymal Echotexture: Moderately heterogenous  Estimated total number of nodules > 1 cm: <5  Number of spongiform nodules > 2 cm not described below (TR1): 0  Number of mixed cystic nodules > 1.5 cm not described below ([place]): 0  _________________________________________________________  Isthmus: 0.7 cm  Nodule # 1:  Prior biopsy: No  Location: Isthmus; left  Size: 1.5 x 1.3 x 1.5 cm, previously 2.2 x 1.4 x 1.8 cm  Composition: solid/almost completely solid (2)  Echogenicity: hyperechoic (1)  Shape: not taller-than-wide (0)  Margins: ill-defined (0)  Echogenic foci: none (0)  ACR TI-RADS total points: 3.  ACR TI-RADS risk category: TR3 (3 points).  Change in features: Yes. Increased internal cystic degeneration and smaller in size.  Change in ACR TI-RADS risk category: No  ACR TI-RADS recommendations:  *Given size (1.5 - 2.4 cm) and appearance, a follow-up ultrasound in 1 year is recommended based on TI-RADS criteria as clinically indicated.  _________________________________________________________  Right lobe: 4.6 x 1.1 x 1.9 cm, previously 4.4 x 1.2 x 1.7 cm  Stable scattered subcentimeter echogenic nodules  _________________________________________________________  Left lobe: 4.8 x 2.1 x 1.9 cm, previously 5.1 x 2.1 x 2.3 cm  Nodule # 1:  Prior biopsy: No  Location: Left; Inferior  Size: 2.4 x 1.7 x 1.9 cm, previously 2.5 x 1.7 x 1.7  Composition: solid/almost completely solid (2)  Echogenicity: hyperechoic (1)  Shape: not taller-than-wide (0)  Margins: ill-defined (0)  Echogenic foci: none (0)  ACR TI-RADS total points: 3.  ACR TI-RADS risk category: TR3 (3 points).  Change in features: No  Change in ACR TI-RADS risk category: No  ACR TI-RADS recommendations:  *Given size (1.5 - 2.4 cm) and appearance, a follow-up ultrasound in 1 year is recommended based on TI-RADS  criteria as clinically  indicated.  IMPRESSION: TR 3 isthmic and left thyroid nodules unchanged. *Given size (1.5 - 2.4 cm) and appearance of both thyroid nodules, a follow-up ultrasound in 1 year is recommended based on TI-RADS criteria as clinically indicated.  The above is in keeping with the ACR TI-RADS recommendations - J Am Coll Radiol 2017;14:587-595.   Electronically Signed By: Sandi Mariscal M.D. On: 11/09/2015 19:10       None of the nodules have enlarged or show concerning features. Since they have been biopsied before, will continue following them clinically and by ultrasound.  PLAN:  1. Patient with long-standing, uncontrolled diabetes, on basal-bolus regimen, with few lows >> we have decreased her doses  >> Less lows now. Sugars are mostly at goal now. Will not change regimen for now. - last HbA1c <7% - I advised her to:  Patient Instructions  Please continue: - Toujeo 25 units at bedtime - Novolog:  17 units with a smaller meal  24 units with a larger meal or with lunch  Please return in 4 months with your sugar log.   - continue checking sugars at different times of the day - check 3-4 times a day, rotating checks - advised for yearly eye exams >> she is UTD - HbA1c today: 6.3% (great!) - Return to clinic in 4 mo with sugar log   2. Hypothyroidism - latest TFTs normal this month - we discussed how to take the thyroid hormone every day, with water, >30 minutes before breakfast, separated by >4 hours from acid reflux medications, calcium, iron, multivitamins. She is taking the LT4 correctly. - continue LT4 50 mcg daily and on Sunday, 25 mcg >> refilled today  3. MNG - previously had 2 normal Bx's (2014). Reviewed the ultrasounds from 2014, 2015, 2017 >> stable nodules. - she c/o dysphagia, but no external Es compression per Ba swallow from 08/2014  - she did have some Es dysmotility and is s/p multiple Es dilations  Philemon Kingdom, MD PhD [facility]  Endocrinology

## 2016-02-05 IMAGING — MR MR HEAD WO/W CM
7 of 13 series · 21 of 48 positions shown · IV contrast (multihance)
Comparison: MRI of the brain 06/18/2013.

CLINICAL DATA: Meningioma status post resection.

EXAM:
MRI HEAD WITHOUT AND WITH CONTRAST
TECHNIQUE: Multiplanar, multiecho pulse sequences of the brain and surrounding
structures were obtained without and with intravenous contrast.
CONTRAST:  15mL MULTIHANCE GADOBENATE DIMEGLUMINE 529 MG/ML IV SOLN

[Series 2: T1 · sagittal · 5.0mm · 0.43mm/px · 3 of 21 slices shown]
[im 1/21]
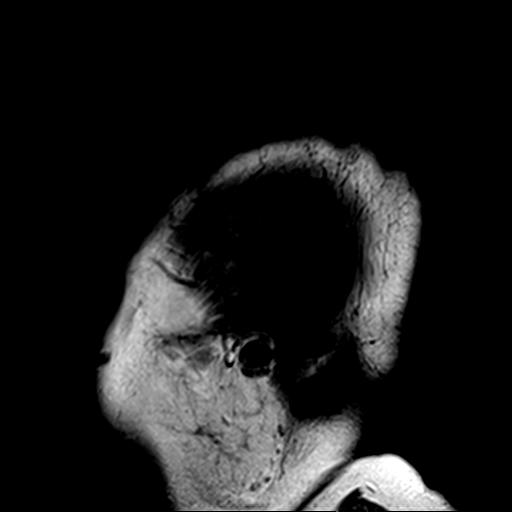
[im 11/21]
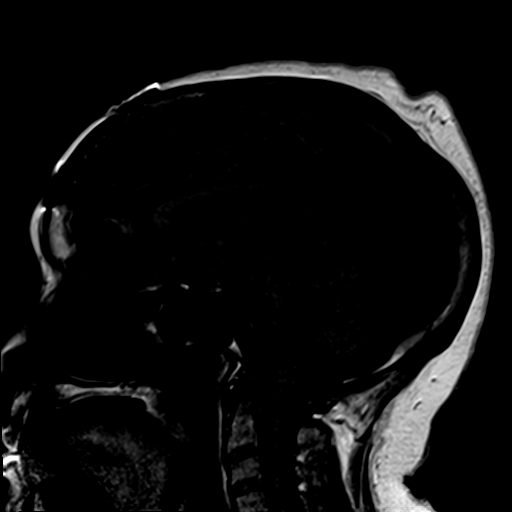
[im 21/21]
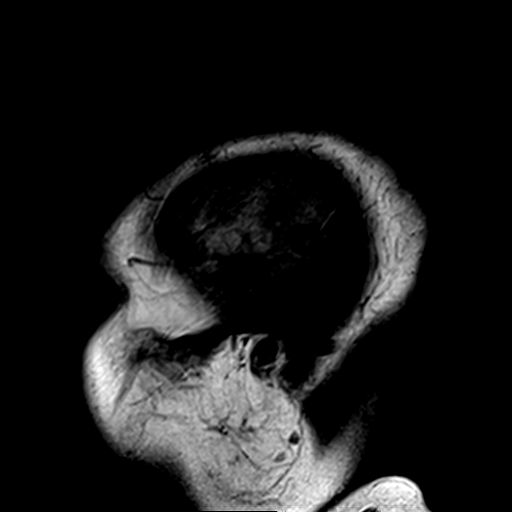

[Series 5: T2 · axial · 5.0mm · 0.48mm/px · z∈[-44,+98]mm · 2 of 23 slices shown (1 of 2)]
[im 1/23]
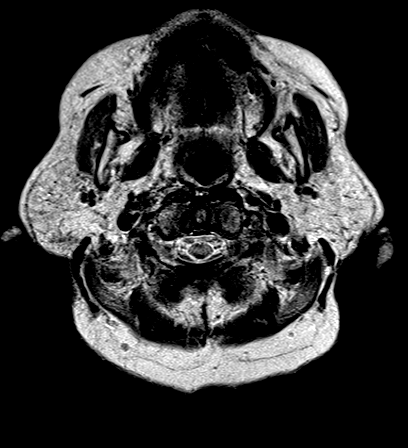
[im 23/23]
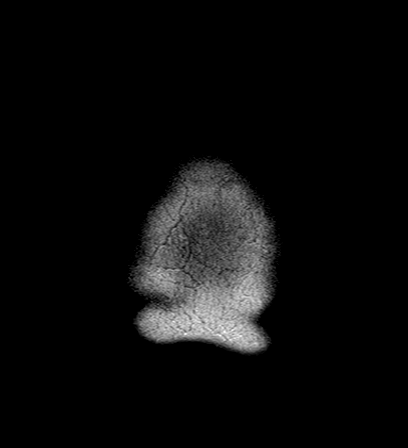

[Series 6: FLAIR · axial · 5.0mm · 0.34mm/px · z∈[-45,+98]mm · 2 of 23 slices shown]
[im 1/23]
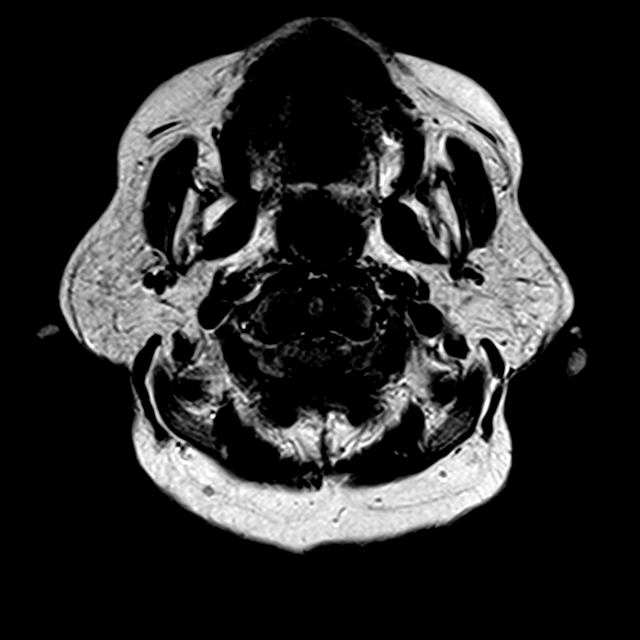
[im 23/23]
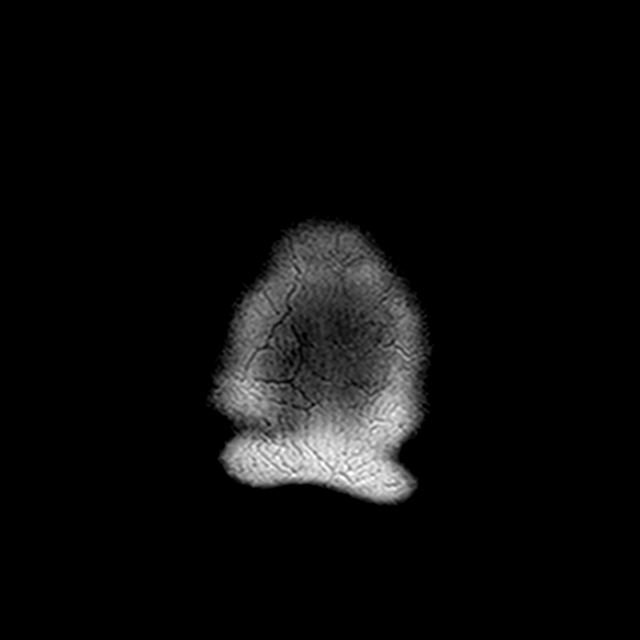

[Series 9: T2 · coronal · 5.0mm · 0.46mm/px · 2 of 24 slices shown (2 of 2)]
[im 1/24]
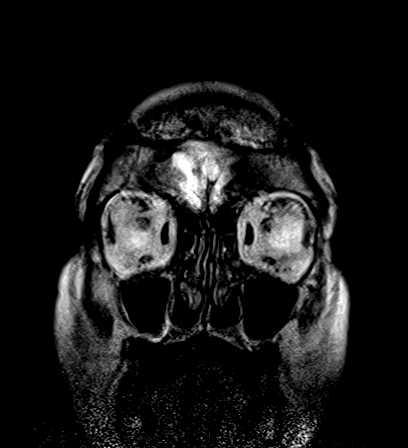
[im 24/24]
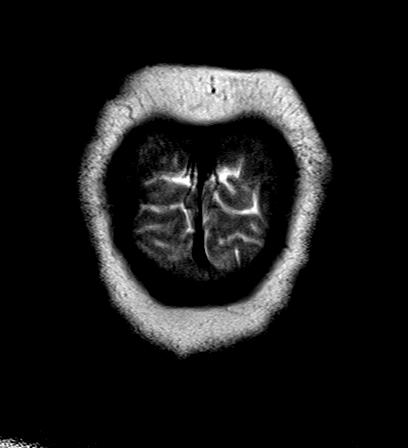

[Series 10: T1 post-contrast · axial · 2.0mm · 0.42mm/px · z∈[-41,+93]mm · 7 of 68 slices shown (1 of 3)]
[im 1/68]
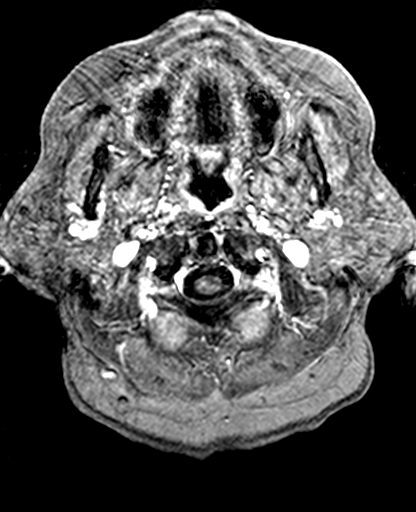
[im 12/68]
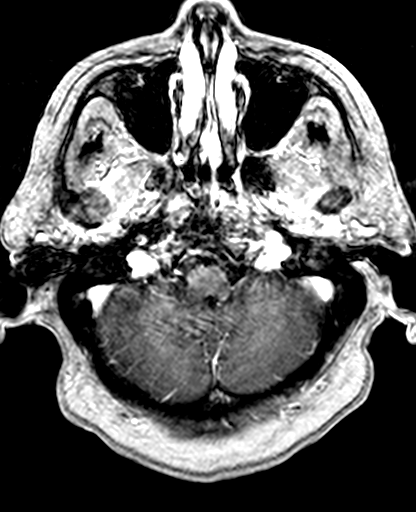
[im 23/68]
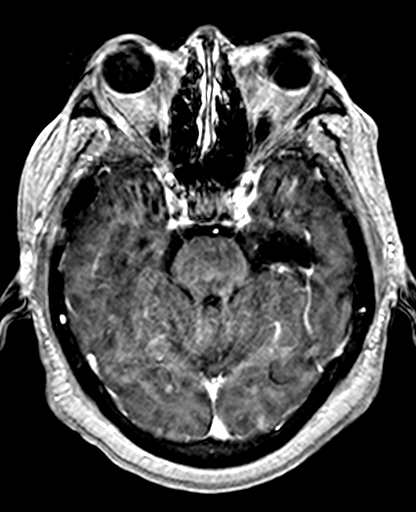
[im 34/68]
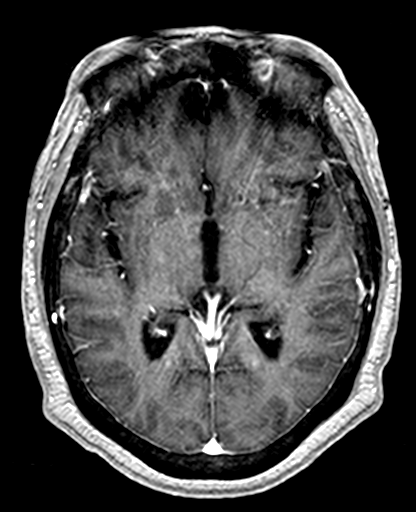
[im 45/68]
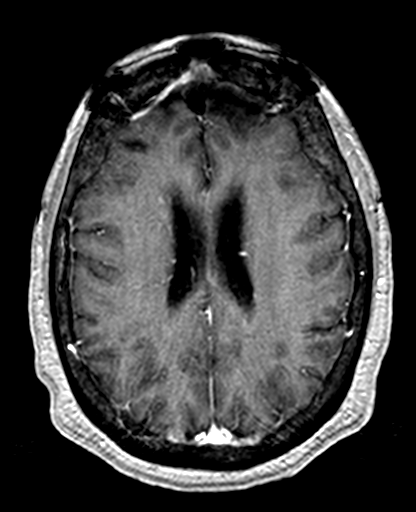
[im 56/68]
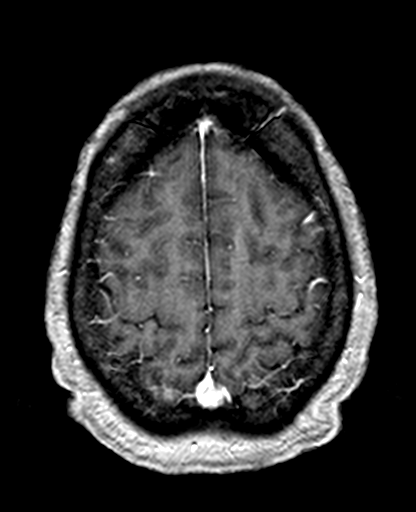
[im 68/68]
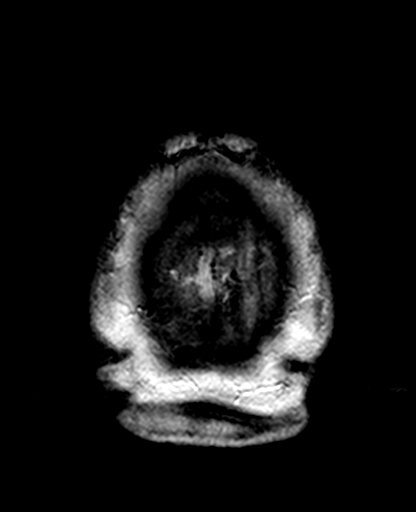

[Series 11: T1 post-contrast · coronal · 5.0mm · 0.38mm/px · 3 of 28 slices shown (2 of 3)]
[im 1/28]
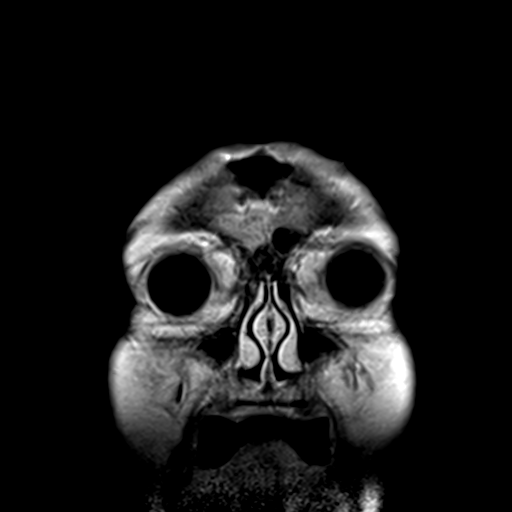
[im 14/28]
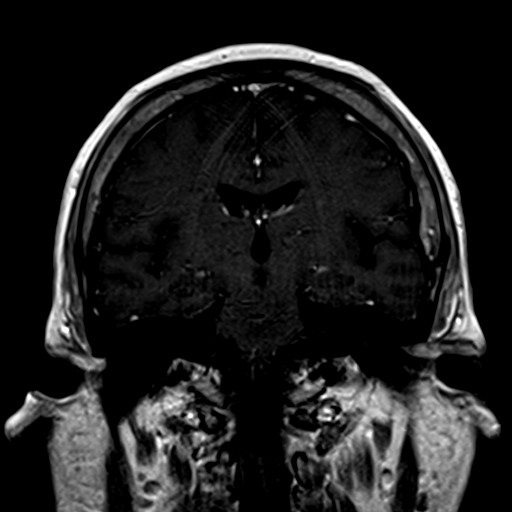
[im 28/28]
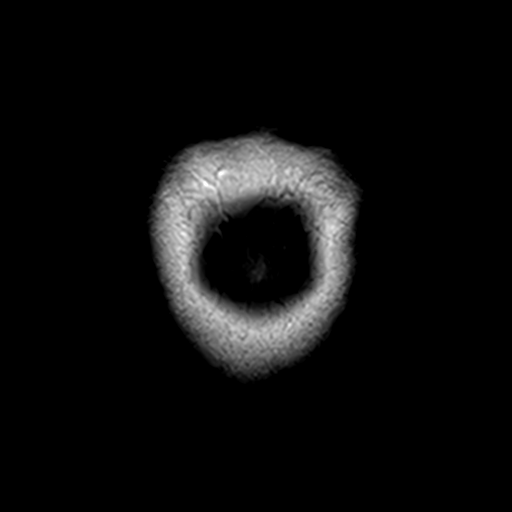

[Series 12: T1 post-contrast · sagittal · 5.0mm · 0.41mm/px · 2 of 21 slices shown (3 of 3)]
[im 1/21]
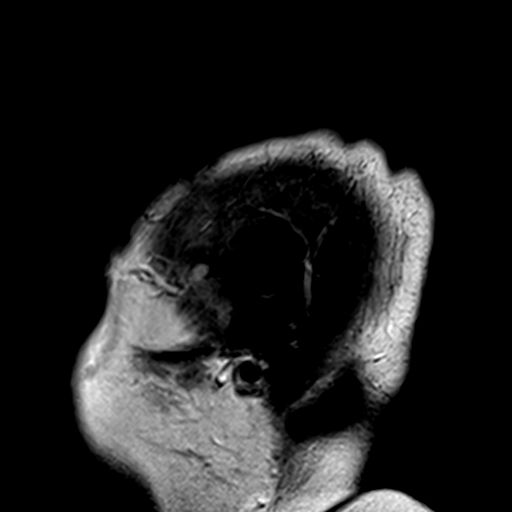
[im 21/21]
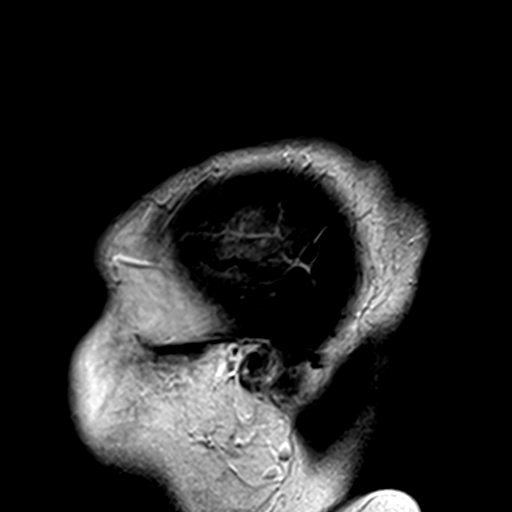

[21 of 48 positions shown; findings below may reference images not displayed]

FINDINGS: A bifrontal craniotomy is again noted. Bifrontal encephalomalacia is
stable. Extensive white matter changes within the brainstem are
stable. There is no significant progression of white matter disease.

No acute infarct, hemorrhage, or mass lesion is present. The
ventricles are of normal size. Flow is present in the major
intracranial arteries. Bilateral lens replacements are noted.

The postcontrast images demonstrate no evidence for residual or
recurrent meningioma along the planum sphenoidale. No pathologic
enhancement is present.

The paranasal sinuses mastoid air cells are clear. Skullbase is
within normal limits. Midline structures are unremarkable.
IMPRESSION: 1. Status post resection of planum sphenoidale meningioma without
evidence for residual or recurrent tumor.
2. No acute intracranial abnormality or significant interval change.

## 2016-02-06 DIAGNOSIS — M79609 Pain in unspecified limb: Secondary | ICD-10-CM | POA: Diagnosis not present

## 2016-02-07 DIAGNOSIS — F250 Schizoaffective disorder, bipolar type: Secondary | ICD-10-CM

## 2016-02-07 DIAGNOSIS — E119 Type 2 diabetes mellitus without complications: Secondary | ICD-10-CM

## 2016-02-07 DIAGNOSIS — M6281 Muscle weakness (generalized): Secondary | ICD-10-CM | POA: Diagnosis not present

## 2016-02-07 DIAGNOSIS — F17210 Nicotine dependence, cigarettes, uncomplicated: Secondary | ICD-10-CM

## 2016-02-07 DIAGNOSIS — Z9181 History of falling: Secondary | ICD-10-CM

## 2016-02-07 DIAGNOSIS — I10 Essential (primary) hypertension: Secondary | ICD-10-CM

## 2016-02-07 DIAGNOSIS — K1321 Leukoplakia of oral mucosa, including tongue: Secondary | ICD-10-CM | POA: Diagnosis not present

## 2016-02-07 DIAGNOSIS — K1329 Other disturbances of oral epithelium, including tongue: Secondary | ICD-10-CM | POA: Diagnosis not present

## 2016-02-07 DIAGNOSIS — R2681 Unsteadiness on feet: Secondary | ICD-10-CM | POA: Diagnosis not present

## 2016-02-07 DIAGNOSIS — M545 Low back pain: Secondary | ICD-10-CM

## 2016-02-07 DIAGNOSIS — I69398 Other sequelae of cerebral infarction: Secondary | ICD-10-CM | POA: Diagnosis not present

## 2016-02-07 DIAGNOSIS — J449 Chronic obstructive pulmonary disease, unspecified: Secondary | ICD-10-CM | POA: Diagnosis not present

## 2016-02-07 DIAGNOSIS — Z794 Long term (current) use of insulin: Secondary | ICD-10-CM

## 2016-02-08 ENCOUNTER — Other Ambulatory Visit: Payer: Self-pay | Admitting: Internal Medicine

## 2016-02-08 ENCOUNTER — Other Ambulatory Visit (HOSPITAL_COMMUNITY): Payer: Self-pay | Admitting: Psychiatry

## 2016-02-08 ENCOUNTER — Ambulatory Visit (INDEPENDENT_AMBULATORY_CARE_PROVIDER_SITE_OTHER): Payer: Commercial Managed Care - HMO | Admitting: Psychiatry

## 2016-02-08 ENCOUNTER — Encounter (HOSPITAL_COMMUNITY): Payer: Self-pay | Admitting: Psychiatry

## 2016-02-08 DIAGNOSIS — M545 Low back pain: Secondary | ICD-10-CM | POA: Diagnosis not present

## 2016-02-08 DIAGNOSIS — I10 Essential (primary) hypertension: Secondary | ICD-10-CM | POA: Diagnosis not present

## 2016-02-08 DIAGNOSIS — F17210 Nicotine dependence, cigarettes, uncomplicated: Secondary | ICD-10-CM | POA: Diagnosis not present

## 2016-02-08 DIAGNOSIS — M6281 Muscle weakness (generalized): Secondary | ICD-10-CM | POA: Diagnosis not present

## 2016-02-08 DIAGNOSIS — J449 Chronic obstructive pulmonary disease, unspecified: Secondary | ICD-10-CM | POA: Diagnosis not present

## 2016-02-08 DIAGNOSIS — F250 Schizoaffective disorder, bipolar type: Secondary | ICD-10-CM | POA: Diagnosis not present

## 2016-02-08 DIAGNOSIS — F331 Major depressive disorder, recurrent, moderate: Secondary | ICD-10-CM

## 2016-02-08 DIAGNOSIS — I69398 Other sequelae of cerebral infarction: Secondary | ICD-10-CM | POA: Diagnosis not present

## 2016-02-08 DIAGNOSIS — R2681 Unsteadiness on feet: Secondary | ICD-10-CM | POA: Diagnosis not present

## 2016-02-08 DIAGNOSIS — E119 Type 2 diabetes mellitus without complications: Secondary | ICD-10-CM | POA: Diagnosis not present

## 2016-02-08 NOTE — Progress Notes (Signed)
Patient:  [name]   DOB: May 21, 1950  MR Number: [id number]  Location: [place]:  [address] [place],  [place], [address]  Start: Tuesday 02/08/2016 11:13 AM End: Tuesday 02/08/2016 11:58 AM  Provider/Observer:     Maurice Small, MSW, LCSW   Chief Complaint:     Depression  Reason For Service:     [name] is a 65 y.o. female who presents with a long standing history of recurrent periods of depression beginning when she was 10 and her favorite uncle died. She has been experiencing increased symptoms of depression recently due to stress in the relationship with her son. Per patient's report, he rarely visits her anymore due to his involvement with an older woman who is controlling. She also reports frustration regarding her knees who is her power of attorney and is very demanding per patient's report. She reports issues with other family members and states she needs help dealing with her family Patient reports multiple psychiatric hospitlaizations due to depression and suicidal ideations with thel last one occuring in 1997. Patient has participated in outpatient psychotherapy and medication management intermittently since age 30.  She currently is seeing psychiatrist Dr. Harrington Challenger . Prior to this, she was being seen at [facility]. Patient also has had ECT at [facility].    Interventions Strategy:  Supportive  Participation Level:   Active  Participation Quality:  Appropriate      Behavioral Observation:  Casual, Alert, and Tearful.   Current Psychosocial Factors: Tension in relationship with son, multiple health issues  Content of Session:   reviewed symptoms, facilitated expression of feelings, examined patient's pattern of interaction in the relationship with her son and grandson, discussed changes in her role, assisted patient identify ways she has coped with previous adversities and disappointments, assisted patient  identify coping statements,discussed  spirituality as coping tool  Current Status:   Depressed mood, anxiety, excessive worry  Suicidal/Homicidal:    No   Patient Progress:   Fair. Patient reports continued stress since last session. She reports visiting son and his girlfriend for Thanksgiving but expresses frustration and diapoointment son didn't invite his son to dinner. She continues to express frustration regarding son's girlfriend and fears she may have negative influence on patient's relationship with her granddaughter. She continues to worry about  her son and his relationship with her grandson. She is pleased she has weekly contact with grandson.   Target Goals:   1.  Learn and implement behavioral strategies to overcome depression.  Last Reviewed:     Goals Addressed Today:    1,2  Plan:      Return again in 2 weeks.  Impression/Diagnosis:   Patient presents with long standing history of recurrent periods  of depression beginning when she was [age] and her favorite uncle died. Patient reports multiple psychiatric hospitlaizations due to depression and suicidal ideations with thel last one occuring in 1997. Patient has participated in outpatient psychotherapy and medication management intermittently since age 15.  She currently is seeing psychiatrist Dr. Harrington Challenger . Prior to this, she was being seen at [facility]. Patient also has had ECT at [facility]. Symptoms have worsened in recent months due to family stress. Current symptoms include depressed mood, anxiety, excessive worry, and tearfulness.   Diagnosis:  Axis I: MDD recurrent episode, moderate          Axis II: Deferred     [name] Rabinovich, LCSW 02/08/2016

## 2016-02-10 ENCOUNTER — Ambulatory Visit (AMBULATORY_SURGERY_CENTER): Payer: Commercial Managed Care - HMO | Admitting: Internal Medicine

## 2016-02-10 ENCOUNTER — Encounter: Payer: Self-pay | Admitting: Internal Medicine

## 2016-02-10 VITALS — BP 171/79 | HR 72 | Temp 99.1°F | Resp 13 | Ht 59.0 in | Wt 165.0 lb

## 2016-02-10 DIAGNOSIS — K589 Irritable bowel syndrome without diarrhea: Secondary | ICD-10-CM | POA: Diagnosis not present

## 2016-02-10 DIAGNOSIS — K2970 Gastritis, unspecified, without bleeding: Secondary | ICD-10-CM

## 2016-02-10 DIAGNOSIS — R1310 Dysphagia, unspecified: Secondary | ICD-10-CM | POA: Diagnosis not present

## 2016-02-10 DIAGNOSIS — K209 Esophagitis, unspecified without bleeding: Secondary | ICD-10-CM

## 2016-02-10 DIAGNOSIS — Z8673 Personal history of transient ischemic attack (TIA), and cerebral infarction without residual deficits: Secondary | ICD-10-CM | POA: Diagnosis not present

## 2016-02-10 DIAGNOSIS — K2990 Gastroduodenitis, unspecified, without bleeding: Secondary | ICD-10-CM

## 2016-02-10 DIAGNOSIS — K3189 Other diseases of stomach and duodenum: Secondary | ICD-10-CM | POA: Diagnosis not present

## 2016-02-10 DIAGNOSIS — F319 Bipolar disorder, unspecified: Secondary | ICD-10-CM | POA: Diagnosis not present

## 2016-02-10 DIAGNOSIS — K5900 Constipation, unspecified: Secondary | ICD-10-CM

## 2016-02-10 DIAGNOSIS — I272 Pulmonary hypertension, unspecified: Secondary | ICD-10-CM | POA: Diagnosis not present

## 2016-02-10 DIAGNOSIS — D124 Benign neoplasm of descending colon: Secondary | ICD-10-CM | POA: Diagnosis not present

## 2016-02-10 DIAGNOSIS — E039 Hypothyroidism, unspecified: Secondary | ICD-10-CM | POA: Diagnosis not present

## 2016-02-10 LAB — GLUCOSE, CAPILLARY
GLUCOSE-CAPILLARY: 87 mg/dL (ref 65–99)
GLUCOSE-CAPILLARY: 153 mg/dL — AB (ref 65–99)

## 2016-02-10 MED ORDER — SODIUM CHLORIDE 0.9 % IV SOLN: 500.0000 mL | INTRAVENOUS | Status: DC

## 2016-02-10 MED ORDER — FLUCONAZOLE 100 MG PO TABS: ORAL_TABLET | ORAL | 0 refills | Status: DC

## 2016-02-10 NOTE — Patient Instructions (Signed)
YOU HAD AN ENDOSCOPIC PROCEDURE TODAY AT [facility]:   Refer to the procedure report that was given to you for any specific questions about what was found during the examination.  If the procedure report does not answer your questions, please call your gastroenterologist to clarify.  If you requested that your care partner not be given the details of your procedure findings, then the procedure report has been included in a sealed envelope for you to review at your convenience later.  YOU SHOULD EXPECT: Some feelings of bloating in the abdomen. Passage of more gas than usual.  Walking can help get rid of the air that was put into your GI tract during the procedure and reduce the bloating. If you had a lower endoscopy (such as a colonoscopy or flexible sigmoidoscopy) you may notice spotting of blood in your stool or on the toilet paper. If you underwent a bowel prep for your procedure, you may not have a normal bowel movement for a few days.  Please Note:  You might notice some irritation and congestion in your nose or some drainage.  This is from the oxygen used during your procedure.  There is no need for concern and it should clear up in a day or so.  SYMPTOMS TO REPORT IMMEDIATELY:   Following lower endoscopy (colonoscopy or flexible sigmoidoscopy):  Excessive amounts of blood in the stool  Significant tenderness or worsening of abdominal pains  Swelling of the abdomen that is new, acute  Fever of 100F or higher   Following upper endoscopy (EGD)  Vomiting of blood or coffee ground material  New chest pain or pain under the shoulder blades  Painful or persistently difficult swallowing  New shortness of breath  Fever of 100F or higher  Black, tarry-looking stools  For urgent or emergent issues, a gastroenterologist can be reached at any hour by calling [phone].   DIET:  We do recommend a small meal at first, but then you may proceed to your regular diet.  Drink  plenty of fluids but you should avoid alcoholic beverages for 24 hours.  ACTIVITY:  You should plan to take it easy for the rest of today and you should NOT DRIVE or use heavy machinery until tomorrow (because of the sedation medicines used during the test).    FOLLOW UP: Our staff will call the number listed on your records the next business day following your procedure to check on you and address any questions or concerns that you may have regarding the information given to you following your procedure. If we do not reach you, we will leave a message.  However, if you are feeling well and you are not experiencing any problems, there is no need to return our call.  We will assume that you have returned to your regular daily activities without incident.  If any biopsies were taken you will be contacted by phone or by letter within the next 1-3 weeks.  Please call us at [phone] if you have not heard about the biopsies in 3 weeks.    SIGNATURES/CONFIDENTIALITY: You and/or your care partner have signed paperwork which will be entered into your electronic medical record.  These signatures attest to the fact that that the information above on your After Visit Summary has been reviewed and is understood.  Full responsibility of the confidentiality of this discharge information lies with you and/or your care-partner.   Resume medications. Information given on polyps,diverticulosis,high fiber diet,Gastritis and Esophagitis.

## 2016-02-10 NOTE — Op Note (Signed)
[place] [facility] Patient Name: [name] Procedure Date: 02/10/2016 1:47 PM MRN: [id number] Endoscopist: Jerene Bears , MD Age: 65 Referring MD:  Date of Birth: [birth year] Gender: Female Account #: [id number] Procedure:                Upper GI endoscopy Indications:              Dysphagia, Gastro-esophageal reflux disease Medicines:                Monitored Anesthesia Care Procedure:                Pre-Anesthesia Assessment:                           - Prior to the procedure, a History and Physical                            was performed, and patient medications and                            allergies were reviewed. The patient's tolerance of                            previous anesthesia was also reviewed. The risks                            and benefits of the procedure and the sedation                            options and risks were discussed with the patient.                            All questions were answered, and informed consent                            was obtained. Prior Anticoagulants: The patient has                            taken no previous anticoagulant or antiplatelet                            agents. ASA Grade Assessment: III - A patient with                            severe systemic disease. After reviewing the risks                            and benefits, the patient was deemed in                            satisfactory condition to undergo the procedure.                           After obtaining informed consent, the endoscope was  passed under direct vision. Throughout the                            procedure, the patient's blood pressure, pulse, and                            oxygen saturations were monitored continuously. The                            Model GIF-HQ190 [id number]) scope was introduced                            through the mouth, and advanced to the second part                            of  duodenum. The upper GI endoscopy was                            accomplished without difficulty. The patient                            tolerated the procedure well. Scope In: Scope Out: Findings:                 Mild to moderate esophagitis with no bleeding was                            found in the middle and distal esophagus. This was                            characterized by ringed appearance with shallow                            linear furrows. There was also adherent whitish                            plaques consist with yeast. Biopsies were obtained                            from the proximal and distal esophagus with cold                            forceps for histology and exclusion of eosinophilic                            esophagitis.                           The Z-line was regular and was found 39 cm from the                            incisors.                           Diffuse mild inflammation characterized by erythema  was found in the gastric antrum. Biopsies were                            taken with a cold forceps for histology and                            Helicobacter pylori testing.                           The cardia and gastric fundus were normal on                            retroflexion.                           The examined duodenum was normal. Complications:            No immediate complications. Estimated Blood Loss:     Estimated blood loss was minimal. Impression:               - Reflux and herpetiform esophagitis. Biopsied.                           - Z-line regular, 39 cm from the incisors. No                            evidence of stricture or stenosis.                           - Gastritis. Biopsied.                           - Normal examined duodenum. Recommendation:           - Patient has a contact number available for                            emergencies. The signs and symptoms of potential                             delayed complications were discussed with the                            patient. Return to normal activities tomorrow.                            Written discharge instructions were provided to the                            patient.                           - Resume previous diet.                           - Continue present medications including Aciphex.  Begin fluconazole 200 mg x 1 day, then 100 mg x 13                            days for Candida esophagitis.                           - Await pathology results.                           - If dysphagia persists, consider esophageal                            manometry. Jerene Bears, MD 02/10/2016 3:06:18 PM This report has been signed electronically.

## 2016-02-10 NOTE — Op Note (Signed)
[facility] Patient Name: [name] Procedure Date: 02/10/2016 1:46 PM MRN: [id number] Endoscopist: Jerene Bears , MD Age: 65 Referring MD:  Date of Birth: 1951-02-11 Gender: Female Account #: [id number] Procedure:                Colonoscopy Indications:              Surveillance: Personal history of adenomatous                            polyps on last colonoscopy 3 years ago Medicines:                Monitored Anesthesia Care Procedure:                Pre-Anesthesia Assessment:                           - Prior to the procedure, a History and Physical                            was performed, and patient medications and                            allergies were reviewed. The patient's tolerance of                            previous anesthesia was also reviewed. The risks                            and benefits of the procedure and the sedation                            options and risks were discussed with the patient.                            All questions were answered, and informed consent                            was obtained. Prior Anticoagulants: The patient has                            taken no previous anticoagulant or antiplatelet                            agents. ASA Grade Assessment: III - A patient with                            severe systemic disease. After reviewing the risks                            and benefits, the patient was deemed in                            satisfactory condition to undergo the procedure.  After obtaining informed consent, the colonoscope                            was passed under direct vision. Throughout the                            procedure, the patient's blood pressure, pulse, and                            oxygen saturations were monitored continuously. The                            Model PCF-H190L [id number]) scope was introduced                            through the anus and  advanced to the the cecum,                            identified by the appendiceal orifice, ileocecal                            valve and palpation. The colonoscopy was performed                            without difficulty. The patient tolerated the                            procedure well. The quality of the bowel                            preparation was adequate to identify polyps 6 mm                            and larger in size. The ileocecal valve,                            appendiceal orifice, and rectum were photographed. Scope In: 2:39:21 PM Scope Out: 2:56:36 PM Scope Withdrawal Time: 0 hours 12 minutes 22 seconds  Total Procedure Duration: 0 hours 17 minutes 15 seconds  Findings:                 The digital rectal exam findings include decreased                            sphincter tone.                           Two sessile polyps were found in the descending                            colon. The polyps were 2 to 5 mm in size. These                            polyps were removed with a cold snare.  Resection                            and retrieval were complete.                           Multiple small and large-mouthed diverticula were                            found in the sigmoid colon, descending colon and                            ascending colon.                           The retroflexed view of the distal rectum and anal                            verge was normal and showed no anal or rectal                            abnormalities. Complications:            No immediate complications. Estimated Blood Loss:     Estimated blood loss was minimal. Impression:               - Decreased sphincter tone found on digital rectal                            exam.                           - Two 2 to 5 mm polyps in the descending colon,                            removed with a cold snare. Resected and retrieved.                           - Moderate diverticulosis in  the sigmoid colon, in                            the descending colon and in the ascending colon.                           - The distal rectum and anal verge are normal on                            retroflexion view. Recommendation:           - Patient has a contact number available for                            emergencies. The signs and symptoms of potential                            delayed complications were discussed with the  patient. Return to normal activities tomorrow.                            Written discharge instructions were provided to the                            patient.                           - Resume previous diet.                           - Continue present medications.                           - Await pathology results.                           - Repeat colonoscopy is recommended. The                            colonoscopy date will be determined after pathology                            results from today's exam become available for                            review.                           - Continue pelvic floor physical therapy with                            Earlie Counts for known pelvic floor dysfunction. Jerene Bears, MD 02/10/2016 3:10:36 PM This report has been signed electronically.

## 2016-02-10 NOTE — Progress Notes (Signed)
Called to room to assist during endoscopic procedure.  Patient ID and intended procedure confirmed with present staff. Received instructions for my participation in the procedure from the performing physician.  

## 2016-02-10 NOTE — Progress Notes (Signed)
To recover report to RN VSS

## 2016-02-11 ENCOUNTER — Telehealth: Payer: Self-pay | Admitting: *Deleted

## 2016-02-11 NOTE — Telephone Encounter (Signed)
  Follow up Call-  Call back number 02/10/2016  Post procedure Call Back phone  # [phone], [phone]  Permission to leave phone message Yes  Some recent data might be hidden     Patient questions:  Do you have a fever, pain , or abdominal swelling? No. Pain Score  0 *  Have you tolerated food without any problems? Yes.    Have you been able to return to your normal activities? Yes.    Do you have any questions about your discharge instructions: Diet   No. Medications  No. Follow up visit  No.  Do you have questions or concerns about your Care? No.  Actions: * If pain score is 4 or above: No action needed, pain <4.

## 2016-02-14 DIAGNOSIS — M6281 Muscle weakness (generalized): Secondary | ICD-10-CM | POA: Diagnosis not present

## 2016-02-14 DIAGNOSIS — E119 Type 2 diabetes mellitus without complications: Secondary | ICD-10-CM | POA: Diagnosis not present

## 2016-02-14 DIAGNOSIS — F250 Schizoaffective disorder, bipolar type: Secondary | ICD-10-CM | POA: Diagnosis not present

## 2016-02-14 DIAGNOSIS — J449 Chronic obstructive pulmonary disease, unspecified: Secondary | ICD-10-CM | POA: Diagnosis not present

## 2016-02-14 DIAGNOSIS — F17210 Nicotine dependence, cigarettes, uncomplicated: Secondary | ICD-10-CM | POA: Diagnosis not present

## 2016-02-14 DIAGNOSIS — I69398 Other sequelae of cerebral infarction: Secondary | ICD-10-CM | POA: Diagnosis not present

## 2016-02-14 DIAGNOSIS — I10 Essential (primary) hypertension: Secondary | ICD-10-CM | POA: Diagnosis not present

## 2016-02-14 DIAGNOSIS — M545 Low back pain: Secondary | ICD-10-CM | POA: Diagnosis not present

## 2016-02-14 DIAGNOSIS — R2681 Unsteadiness on feet: Secondary | ICD-10-CM | POA: Diagnosis not present

## 2016-02-15 ENCOUNTER — Telehealth: Payer: Self-pay | Admitting: Family Medicine

## 2016-02-15 ENCOUNTER — Encounter: Payer: Self-pay | Admitting: Physical Therapy

## 2016-02-15 ENCOUNTER — Ambulatory Visit: Payer: Commercial Managed Care - HMO | Attending: Internal Medicine | Admitting: Physical Therapy

## 2016-02-15 DIAGNOSIS — M62838 Other muscle spasm: Secondary | ICD-10-CM

## 2016-02-15 DIAGNOSIS — M6281 Muscle weakness (generalized): Secondary | ICD-10-CM | POA: Diagnosis not present

## 2016-02-15 DIAGNOSIS — R279 Unspecified lack of coordination: Secondary | ICD-10-CM | POA: Diagnosis not present

## 2016-02-15 DIAGNOSIS — M25571 Pain in right ankle and joints of right foot: Secondary | ICD-10-CM

## 2016-02-15 DIAGNOSIS — W19XXXA Unspecified fall, initial encounter: Secondary | ICD-10-CM

## 2016-02-15 NOTE — Telephone Encounter (Signed)
Please advise 

## 2016-02-15 NOTE — Telephone Encounter (Signed)
pls order , I will sign

## 2016-02-15 NOTE — Telephone Encounter (Signed)
Order entered and patient aware.

## 2016-02-15 NOTE — Patient Instructions (Signed)
About Abdominal Massage  Abdominal massage, also called external colon massage, is a self-treatment circular massage technique that can reduce and eliminate gas and ease constipation. The colon naturally contracts in waves in a clockwise direction starting from inside the right hip, moving up toward the ribs, across the belly, and down inside the left hip.  When you perform circular abdominal massage, you help stimulate your colon's normal wave pattern of movement called peristalsis.  It is most beneficial when done after eating.  Positioning You can practice abdominal massage with oil while lying down, or in the shower with soap.  Some people find that it is just as effective to do the massage through clothing while sitting or standing.  How to Massage Start by placing your finger tips or knuckles on your right side, just inside your hip bone.  . Make small circular movements while you move upward toward your rib cage.   . Once you reach the bottom right side of your rib cage, take your circular movements across to the left side of the bottom of your rib cage.  . Next, move downward until you reach the inside of your left hip bone.  This is the path your feces travel in your colon. . Continue to perform your abdominal massage in this pattern for 10 minutes each day.     You can apply as much pressure as is comfortable in your massage.  Start gently and build pressure as you continue to practice.  Notice any areas of pain as you massage; areas of slight pain may be relieved as you massage, but if you have areas of significant or intense pain, consult with your healthcare provider.  Other Considerations . General physical activity including bending and stretching can have a beneficial massage-like effect on the colon.  Deep breathing can also stimulate the colon because breathing deeply activates the same nervous system that supplies the colon.   . Abdominal massage should always be used in  combination with a bowel-conscious diet that is high in the proper type of fiber for you, fluids (primarily water), and a regular exercise   Introduction to [place] and daily habits can help you predict when your bowels will move on a regular basis.  The consistency and quantity of the stool is usually more important than the frequency.  The goal is to have a regular bowel movement that is soft but formed.   Tips on Emptying Regularly . Eat breakfast.  Usually the best time of day for a bowel movement will be a half hour to an hour after eating.  These times are best because the body uses the gastrocolic reflex, a stimulation of bowel motion that occurs with eating, to help produce a bowel movement.  For some people even a simple hot drink in the morning can help the reflex action begin. . Eat all your meals at a predictable time each day.  The bowel functions best when food is introduced at the same regular intervals. . The amount of food eaten at a given time of day should be about the same size from day to day.  The bowel functions best when food is introduced in similar quantities from day to day. It is fine to have a small breakfast and a large lunch, or vice versa, just be consistent. . Eat two servings of fruit or vegetables and at least one serving of a complex carbohydrates (whole grains such as brown rice, bran, whole wheat bread, or  oatmeal) at each meal. . Drink plenty of water-ideally eight glasses a day.  Be sure to increase your water intake if you are increasing fiber into your diet.  Maintain Healthy Habits . Exercise daily.  You may exercise at any time of day, but you may find that bowel function is helped most if the exercise is at a consistent time each day. . Make sure that you are not rushed and have convenient access to a bathroom at your selected time to empty your bowels.      Drink 80 ounces of water or 10 glasses of water that are 8 ounces.  Earlie Counts,  PT @TODAY @ 8:25 AM  [facility] [address], [place], [place] [address] Phone # [phone] Fax [phone]

## 2016-02-15 NOTE — Therapy (Signed)
[facility] [address], [place], [place], [address] Phone: [phone]   Fax:  [phone]  Physical Therapy Treatment  Patient Details  Name: [name] MRN: [id number] Date of Birth: 18-Jan-1951 Referring Provider: Dr. Zenovia Jarred  Encounter Date: 02/15/2016      PT End of Session - 02/15/16 0827    Visit Number 2   Number of Visits 10   Date for PT Re-Evaluation 03/14/16   Authorization Type g-code 10th visit;    PT Start Time 0800   PT Stop Time 0828  ended early due to patient falling asleep on therapist   PT Time Calculation (min) 28 min   Activity Tolerance Patient limited by fatigue;Other (comment)  kept on falling asleep on therapist   Behavior During Therapy [facility] for tasks assessed/performed  very sleepy      Past Medical History:  Diagnosis Date  . Anemia   . Anxiety    takes Ativan daily  . Arthritis   . Bipolar disorder ([place])    takes Risperdal nightly  . Blood transfusion   . Cancer ([place])    In her gum  . Carpal tunnel syndrome of right wrist 05/23/2011  . Cervical disc disorder with radiculopathy of cervical region 10/31/2012  . Chronic back pain   . Chronic idiopathic constipation   . Colon polyps   . COPD (chronic obstructive pulmonary disease) with chronic bronchitis ([place]) 09/16/2013   Office Spirometry 10/30/2013-submaximal effort based on appearance of loop and curve. Numbers would fit with severe restriction but her physiologic capability may be better than this. FVC 0.91/44%, and 10.74/45%, FEV1/FVC 0.81, FEF 25-75% 1.43/69%    . Depression    takes Zoloft daily  . Diabetes mellitus    Type II  . Diverticulosis    TCS 9/08 by Dr. Delfin Edis for diarrhea . Bx for micro scopic colitis negative.   . Fibromyalgia   . Frequent falls   . GERD (gastroesophageal reflux disease)    takes Aciphex daily  . Glaucoma    eye drops daily  . Gum symptoms    infection on antibiotic  .  Hemiplegia affecting non-dominant side, post-stroke ([place]) 08/02/2011  . Hyperlipidemia    takes Crestor daily  . Hypertension    takes Amlodipine,Metoprolol,and Clonidine daily  . Hypothyroidism    takes Synthroid daily  . IBS (irritable bowel syndrome)   . Insomnia    takes Trazodone nightly  . Metabolic encephalopathy 7/74/1287  . Migraines    chronic headaches  . Mononeuritis lower limb   . Osteoporosis   . Pancreatitis 2006   due to Depakote with normal EUS   . Schatzki's ring    non critical / EGD with ED 8/2011with RMR  . Seizures ([place])    takes Lamictal daily.Last seizure 3 yrs ago  . Sleep apnea    on CPAP  . Stroke [facility] [place]    left sided weakness  . Tubular adenoma of colon     Past Surgical History:  Procedure Laterality Date  . ABDOMINAL HYSTERECTOMY  1978  . BACK SURGERY  July 2012  . BACTERIAL OVERGROWTH TEST N/A 05/05/2013   Procedure: BACTERIAL OVERGROWTH TEST;  Surgeon: Daneil Dolin, MD;  [facility];  Service: Endoscopy;  Laterality: N/A;  7:30  . BIOPSY THYROID  2009  . BRAIN SURGERY  11/2011   resection of meningioma  . BREAST REDUCTION SURGERY  1994  . CARDIAC CATHETERIZATION  05/10/2005  normal coronaries, normal LV systolic function and EF (Dr. Jackie Plum)  . CARPAL TUNNEL RELEASE Left 07/22/04   Dr. Aline Brochure  . CATARACT EXTRACTION Bilateral   . CHOLECYSTECTOMY  1984  . COLONOSCOPY N/A 09/25/2012   IRS:WNIOEVO diverticulosis.  colonic polyp-removed : tubular adenoma  . CRANIOTOMY  11/23/2011   Procedure: CRANIOTOMY TUMOR EXCISION;  Surgeon: Hosie Spangle, MD;  Location: [place] [facility];  Service: Neurosurgery;  Laterality: N/A;  Craniotomy for tumor resection  . ESOPHAGOGASTRODUODENOSCOPY  12/29/2010   [place]-Retained food in the esophagus and stomach, small hiatal hernia, status post Maloney dilation of the esophagus  . ESOPHAGOGASTRODUODENOSCOPY N/A 09/25/2012   JJK:[id number] atonic baggy esophagus status post Maloney dilation 26 F. Hiatal  hernia  . GIVENS CAPSULE STUDY N/A 01/15/2013   NORMAL.   [name] LESION REMOVAL N/A 05/31/2015   Procedure: REMOVAL RIGHT AND LEFT LESIONS OF MANDIBLE;  Surgeon: Diona Browner, DDS;  Location: [place];  Service: Oral Surgery;  Laterality: N/A;  . MALONEY DILATION  12/29/2010   RMR;  . NM MYOCAR PERF WALL MOTION  2006   "relavtiely normal" persantine, mild anterior thinning (breast attenuation artifact), no region of scar/ischemia  . OVARIAN CYST REMOVAL    . RECTOCELE REPAIR N/A 06/29/2015   Procedure: POSTERIOR REPAIR (RECTOCELE);  Surgeon: Jonnie Kind, MD;  Location: [facility];  Service: Gynecology;  Laterality: N/A;  . SPINE SURGERY  09/29/2010   Dr. Rolena Infante  . surgical excision of 3 tumors from right thigh and right buttock  and left upper thigh  2010  . TOOTH EXTRACTION Bilateral 12/14/2014   Procedure: REMOVAL OF BILATERAL MANDIBULAR EXOSTOSES;  Surgeon: Diona Browner, DDS;  Location: [place];  Service: Oral Surgery;  Laterality: Bilateral;  . TRANSTHORACIC ECHOCARDIOGRAM  2010   EF 60-65%, mild conc LVH, grade 1 diastolic dysfunction; mildly calcified MV annulus with mildly thickened leaflets, mildly calcified MR annulus    There were no vitals filed for this visit.      Subjective Assessment - 02/15/16 0805    Subjective Still having trouble to go to the bathroom.  I had my colonoscopy last Thursday.  They found polyp, infection in stomach and esophogus.  I had a biopsy of my stomach and esophogus. I fell off the bed last Saturday. I was sitting up and went to lay on side.  Had trouble to get body into sitting. The right leg hit the rail and got hit on the back of the head .I have not seen the doctor.    Patient Stated Goals not strain to have a bowel movement   Currently in Pain? Yes   Pain Score 6    Pain Location Leg   Pain Orientation Right   Pain Descriptors / Indicators Aching;Sharp;Shooting   Pain Type Acute pain   Pain Onset In the past 7 days   Pain Frequency Intermittent    Aggravating Factors  standing on leg   Pain Relieving Factors off right leg   Multiple Pain Sites No                         [facility] Adult PT Treatment/Exercise - 02/15/16 0001      Manual Therapy   Manual Therapy Soft tissue mobilization;Myofascial release   Soft tissue mobilization circular massage to abdomen to improve peristalic effect on colon                PT Education - 02/15/16 0829    Education  provided Yes   Education Details abdominal massage; bowel health   Person(s) Educated Patient   Methods Explanation;Demonstration;Verbal cues;Handout   Comprehension Returned demonstration;Verbalized understanding;Need further instruction;Other (comment)  very sleepy          PT Short Term Goals - 02/15/16 1517      PT SHORT TERM GOAL #1   Title independent with abdominal massage   Time 4   Period Weeks   Status On-going  still learning     PT SHORT TERM GOAL #2   Title ability to strain </= 25% less due to increased pelvic floor strength and improved coordination   Time 4   Period Weeks   Status On-going     PT SHORT TERM GOAL #3   Title abdominal pain decreased >/= 25% due to ability to have a bowel movement   Period Weeks   Status On-going           PT Long Term Goals - 01/18/16 1416      PT LONG TERM GOAL #1   Title independent with HEP   Time 8   Period Weeks   Status New     PT LONG TERM GOAL #2   Title understand correct toileting technique using abdominal bracing and relaxation of the pelvic floor to not strain with a bowel movement   Time 8   Period Weeks   Status New     PT LONG TERM GOAL #3   Title ability to have a bowel movement with 50% less straining due to increased pelvic floor strength >/= 3/5   Time 8   Period Weeks   Status New     PT LONG TERM GOAL #4   Title abdominal pain decreased >/= 50% due to bowels able to move through the intestines with greater ease   Time 8   Period Weeks   Status New      PT LONG TERM GOAL #5   Title FOTO score </= 55% limitation   Time 8   Period Weeks   Status New     Additional Long Term Goals   Additional Long Term Goals Yes     PT LONG TERM GOAL #6   Title timed up and go test </=25 sec   Time 8   Period Weeks   Status New               Plan - 02/15/16 6160    Clinical Impression Statement Patient was not able to remember last visit being instructed on abdominal massage.  Therapist reviewed the abdominal massage but patient kept falling asleep in therapy.  Patient has not met goals due to just starting therapy.  Patient has an infection of the esophogus and stomach and is taking antibiotics.  Patient continues to have a weak rectal shincter.  Patient will be educated on contracting rectal sphincter when she is more awake. Patient will benefit from skilled therapy to improve pelvic floor strength, improve coordination of pelvic floor and reduce pain due to being constipated.    Rehab Potential Good   Clinical Impairments Affecting Rehab Potential s/p Craniotomy; rectocele surgery; stroke with left sided weakness; lumbar surger   PT Frequency 1x / week   PT Duration 8 weeks   PT Treatment/Interventions Biofeedback;Electrical Stimulation;Therapeutic activities;Therapeutic exercise;Neuromuscular re-education;Balance training;Patient/family education;Passive range of motion;Manual techniques;Energy conservation   PT Next Visit Plan abdominal massage; abdominal bracing; pelvic floor contraction   PT Home Exercise Plan pelvic floor exercise with assistance  Consulted and Agree with Plan of Care Patient      Patient will benefit from skilled therapeutic intervention in order to improve the following deficits and impairments:  Decreased coordination, Decreased strength, Decreased activity tolerance, Decreased endurance, Increased muscle spasms, Increased fascial restricitons, Decreased balance  Visit Diagnosis: Muscle weakness  (generalized)  Unspecified lack of coordination  Other muscle spasm     Problem List Patient Active Problem List   Diagnosis Date Noted  . Osteopenia 01/20/2016  . History of palpitations 08/09/2015  . Nausea without vomiting 08/09/2015  . Labile hypertension 08/03/2015  . Normal coronary arteries 08/03/2015  . Left-sided low back pain with left-sided sciatica 06/27/2015  . Left flank pain 06/27/2015  . Acute cystitis without hematuria 05/10/2015  . Poorly controlled type 2 diabetes mellitus with circulatory disorder ([place]) 05/06/2015  . Multinodular goiter 05/06/2015  . Rectocele, female 04/27/2015  . Anal sphincter incontinence 04/27/2015  . Pelvic relaxation due to rectocele 03/30/2015  . Pulmonary hypertension 02/22/2015  . Weakness of left upper extremity 02/21/2015  . Light cigarette smoker (1-9 cigarettes per day) 01/11/2015  . Annual physical exam 01/05/2015  . Migraine without aura and without status migrainosus, not intractable 07/02/2014  . Flatulence 02/18/2014  . Microcytic anemia 02/18/2014  . COPD (chronic obstructive pulmonary disease) with chronic bronchitis ([place]) 09/16/2013  . Hypothyroidism 08/16/2013  . Gastroparesis 04/28/2013  . Seizure disorder ([place]) 01/19/2013  . Cervical disc disorder with radiculopathy of cervical region 10/31/2012  . Solitary pulmonary nodule 08/19/2012  . Anemia 07/05/2012  . 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 Allergic sinusitis 04/18/2012  . Meningioma ([place]) 11/19/2011  . 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 Hemiplegia affecting non-dominant side, post-stroke ([place]) 08/02/2011  . Carpal tunnel syndrome of right wrist 05/23/2011  . Polypharmacy 04/28/2011  . Bipolar disorder ([place]) 04/28/2011  . Constipation 04/13/2011  . Falls frequently 12/12/2010  . Urinary incontinence 12/16/2009  . HEARING LOSS 10/26/2009  . Hyperlipidemia 12/11/2008  . IBS 12/11/2008  . GERD 07/29/2008  . MILK PRODUCTS  ALLERGY 07/29/2008  . 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 Backache 06/19/2007  . Osteoporosis 06/19/2007  . Obstructive sleep apnea 06/19/2007  . TRIGGER FINGER 04/18/2007  . DIVERTICULOSIS, COLON 11/13/2006    Earlie Counts, PT 02/15/16 8:34 AM   [facility] [address], [place], [place], [address] Phone: [phone]   Fax:  [phone]  Name: [name] MRN: [id number] Date of Birth: [birth year]

## 2016-02-15 NOTE — Telephone Encounter (Signed)
[name] is calling asking if [name] would order an xray of her right ankle she fell Thursday night and hit it against the bed post, please advise?

## 2016-02-16 ENCOUNTER — Encounter: Payer: Self-pay | Admitting: Physical Therapy

## 2016-02-17 ENCOUNTER — Telehealth (HOSPITAL_COMMUNITY): Payer: Self-pay

## 2016-02-17 DIAGNOSIS — D329 Benign neoplasm of meninges, unspecified: Secondary | ICD-10-CM | POA: Diagnosis not present

## 2016-02-17 NOTE — Telephone Encounter (Signed)
Called to schedule consult, left message for pt to return call. AW 

## 2016-02-18 ENCOUNTER — Encounter: Payer: Self-pay | Admitting: Internal Medicine

## 2016-02-18 DIAGNOSIS — G4733 Obstructive sleep apnea (adult) (pediatric): Secondary | ICD-10-CM | POA: Diagnosis not present

## 2016-02-19 DIAGNOSIS — F17210 Nicotine dependence, cigarettes, uncomplicated: Secondary | ICD-10-CM | POA: Diagnosis not present

## 2016-02-19 DIAGNOSIS — R2681 Unsteadiness on feet: Secondary | ICD-10-CM | POA: Diagnosis not present

## 2016-02-19 DIAGNOSIS — I10 Essential (primary) hypertension: Secondary | ICD-10-CM | POA: Diagnosis not present

## 2016-02-19 DIAGNOSIS — E119 Type 2 diabetes mellitus without complications: Secondary | ICD-10-CM | POA: Diagnosis not present

## 2016-02-19 DIAGNOSIS — I69398 Other sequelae of cerebral infarction: Secondary | ICD-10-CM | POA: Diagnosis not present

## 2016-02-19 DIAGNOSIS — M6281 Muscle weakness (generalized): Secondary | ICD-10-CM | POA: Diagnosis not present

## 2016-02-19 DIAGNOSIS — F250 Schizoaffective disorder, bipolar type: Secondary | ICD-10-CM | POA: Diagnosis not present

## 2016-02-19 DIAGNOSIS — J449 Chronic obstructive pulmonary disease, unspecified: Secondary | ICD-10-CM | POA: Diagnosis not present

## 2016-02-19 DIAGNOSIS — M545 Low back pain: Secondary | ICD-10-CM | POA: Diagnosis not present

## 2016-02-21 ENCOUNTER — Encounter (HOSPITAL_COMMUNITY): Payer: Self-pay | Admitting: Psychiatry

## 2016-02-21 ENCOUNTER — Ambulatory Visit (INDEPENDENT_AMBULATORY_CARE_PROVIDER_SITE_OTHER): Payer: Commercial Managed Care - HMO | Admitting: Psychiatry

## 2016-02-21 VITALS — BP 122/77 | HR 61 | Ht 59.0 in | Wt 162.6 lb

## 2016-02-21 DIAGNOSIS — I10 Essential (primary) hypertension: Secondary | ICD-10-CM | POA: Diagnosis not present

## 2016-02-21 DIAGNOSIS — Z803 Family history of malignant neoplasm of breast: Secondary | ICD-10-CM

## 2016-02-21 DIAGNOSIS — Z823 Family history of stroke: Secondary | ICD-10-CM | POA: Diagnosis not present

## 2016-02-21 DIAGNOSIS — Z841 Family history of disorders of kidney and ureter: Secondary | ICD-10-CM

## 2016-02-21 DIAGNOSIS — D329 Benign neoplasm of meninges, unspecified: Secondary | ICD-10-CM | POA: Diagnosis not present

## 2016-02-21 DIAGNOSIS — Z79899 Other long term (current) drug therapy: Secondary | ICD-10-CM

## 2016-02-21 DIAGNOSIS — Z888 Allergy status to other drugs, medicaments and biological substances status: Secondary | ICD-10-CM | POA: Diagnosis not present

## 2016-02-21 DIAGNOSIS — R040 Epistaxis: Secondary | ICD-10-CM | POA: Diagnosis not present

## 2016-02-21 DIAGNOSIS — Z6830 Body mass index (BMI) 30.0-30.9, adult: Secondary | ICD-10-CM | POA: Diagnosis not present

## 2016-02-21 DIAGNOSIS — Z8249 Family history of ischemic heart disease and other diseases of the circulatory system: Secondary | ICD-10-CM

## 2016-02-21 DIAGNOSIS — F331 Major depressive disorder, recurrent, moderate: Secondary | ICD-10-CM | POA: Diagnosis not present

## 2016-02-21 DIAGNOSIS — Z833 Family history of diabetes mellitus: Secondary | ICD-10-CM

## 2016-02-21 DIAGNOSIS — Z880 Allergy status to penicillin: Secondary | ICD-10-CM

## 2016-02-21 MED ORDER — RISPERIDONE 0.5 MG PO TABS: 0.5000 mg | ORAL_TABLET | Freq: Every day | ORAL | 3 refills | Status: DC

## 2016-02-21 MED ORDER — TRAZODONE HCL 150 MG PO TABS: 300.0000 mg | ORAL_TABLET | Freq: Every day | ORAL | 3 refills | Status: DC

## 2016-02-21 MED ORDER — SERTRALINE HCL 100 MG PO TABS: 200.0000 mg | ORAL_TABLET | Freq: Every day | ORAL | 3 refills | Status: DC

## 2016-02-21 MED ORDER — LORAZEPAM 0.5 MG PO TABS: 0.5000 mg | ORAL_TABLET | Freq: Three times a day (TID) | ORAL | 2 refills | Status: DC
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
## 2016-02-22 ENCOUNTER — Ambulatory Visit (HOSPITAL_COMMUNITY): Payer: Self-pay | Admitting: Psychiatry

## 2016-02-22 IMAGING — RF DG ESOPHAGUS
14 of 20 series · 14 of 20 positions shown · non-contrast
Comparison: None.

ADDENDUM:
Patient returned for assessment during the swallowing of a 12.5 mm
diameter barium tablet.

Under fluoroscopic visualization, the barium tablet transiently
delayed at the gastroesophageal junction but passed beyond into the
stomach with additional swallows of water.
No obstruction of the tablet was identified.
CLINICAL DATA: Dysphagia. Food/ meet getting stuck. Multiple prior
esophageal dilatations.
EXAM:
ESOPHOGRAM/BARIUM SWALLOW
TECHNIQUE: Combined double contrast and single contrast examination performed
using effervescent crystals, thick barium liquid, and thin barium
liquid.
FLUOROSCOPY TIME:  Fluoroscopy Time:  3 minutes 24 seconds
Number of Acquired Images:  20

[Series 1: run · 1 of 1 slices shown (1 of 14)]
[im 1/1]
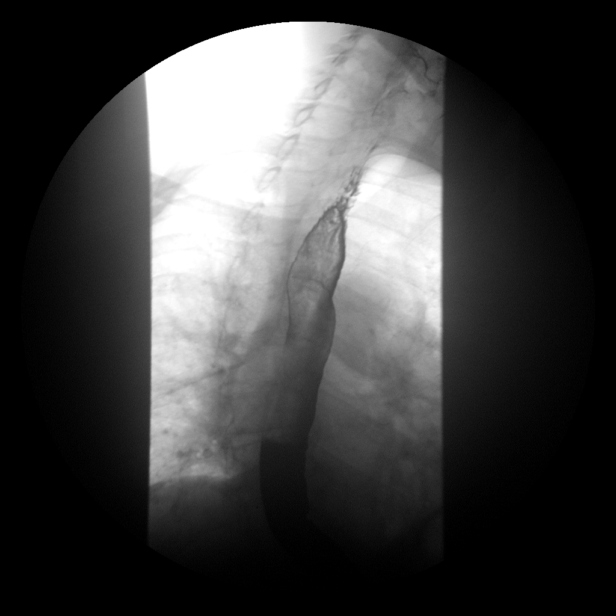

[Series 3: run · 1 of 1 slices shown (2 of 14)]
[im 1/1]
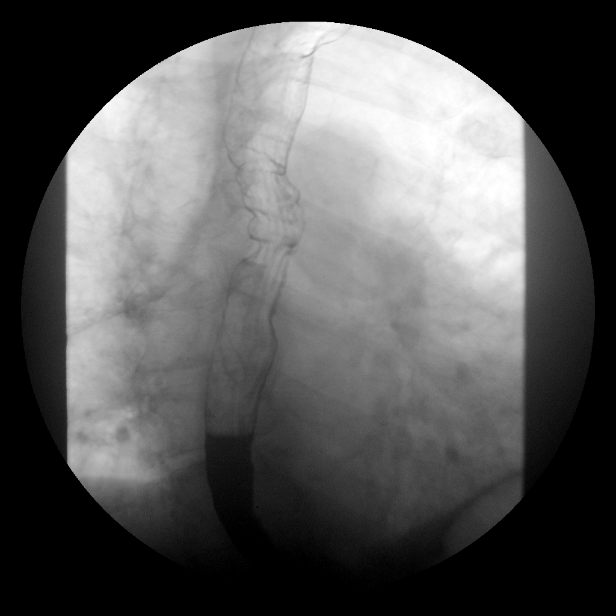

[Series 4: run · 1 of 1 slices shown (3 of 14)]
[im 1/1]
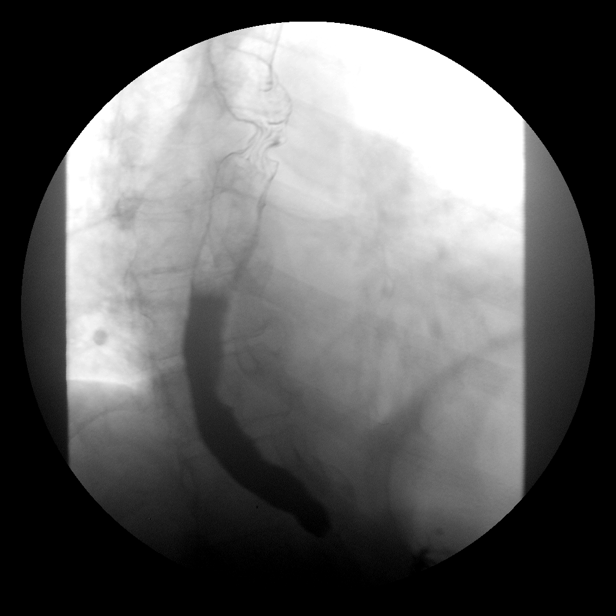

[Series 6: run · 1 of 1 slices shown (4 of 14)]
[im 1/1]
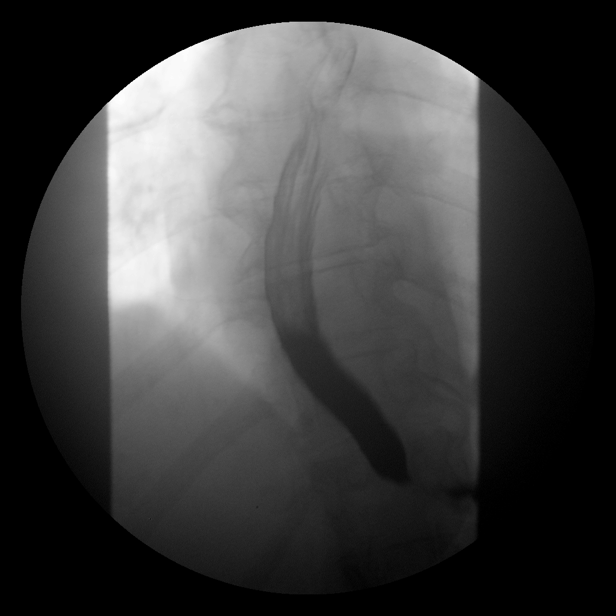

[Series 7: run · 1 of 1 slices shown (5 of 14)]
[im 1/1]
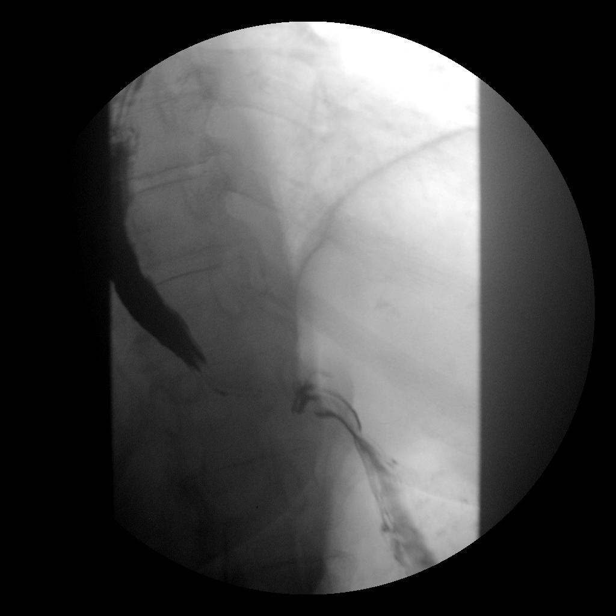

[Series 8: run · 1 of 1 slices shown (6 of 14)]
[im 1/1]
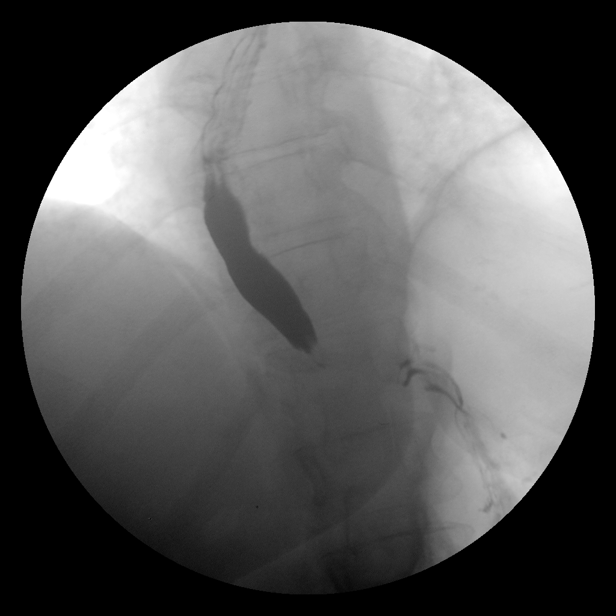

[Series 10: run · 1 of 1 slices shown (7 of 14)]
[im 1/1]
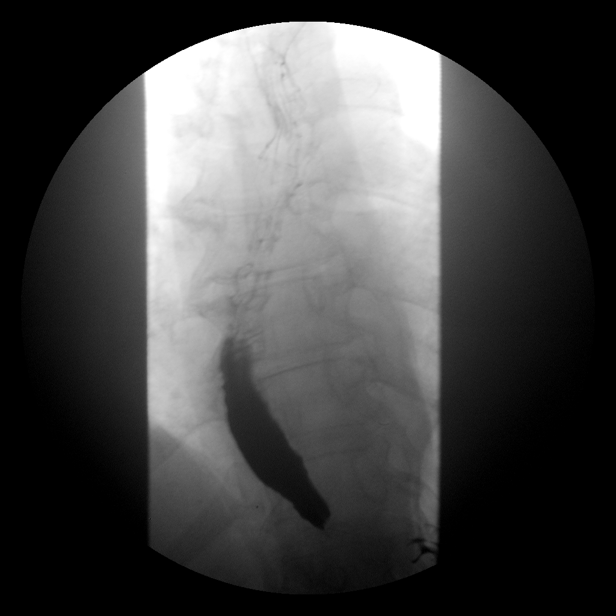

[Series 11: run · 1 of 1 slices shown (8 of 14)]
[im 1/1]
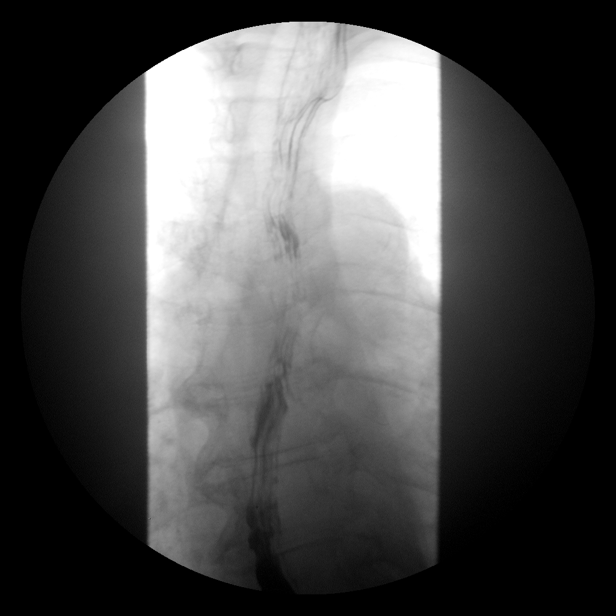

[Series 13: run · 1 of 1 slices shown (9 of 14)]
[im 1/1]
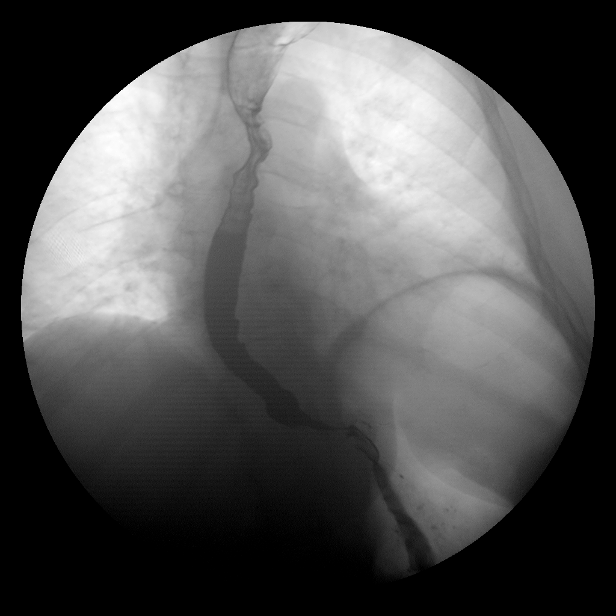

[Series 14: run · 1 of 1 slices shown (10 of 14)]
[im 1/1]
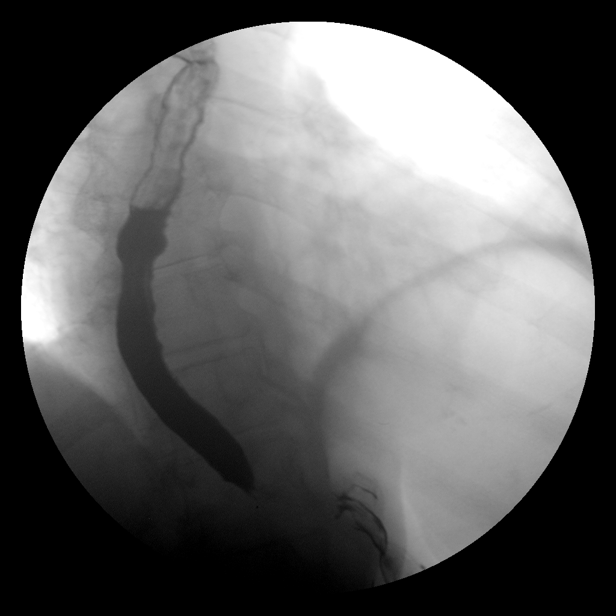

[Series 16: run · 1 of 1 slices shown (11 of 14)]
[im 1/1]
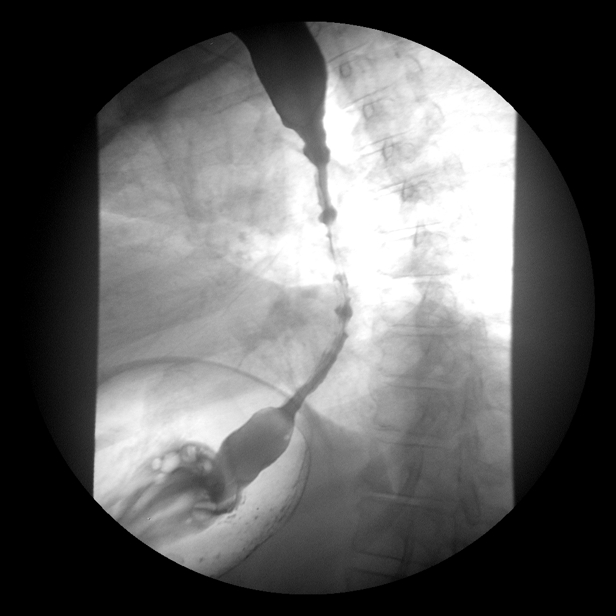

[Series 17: run · 1 of 1 slices shown (12 of 14)]
[im 1/1]
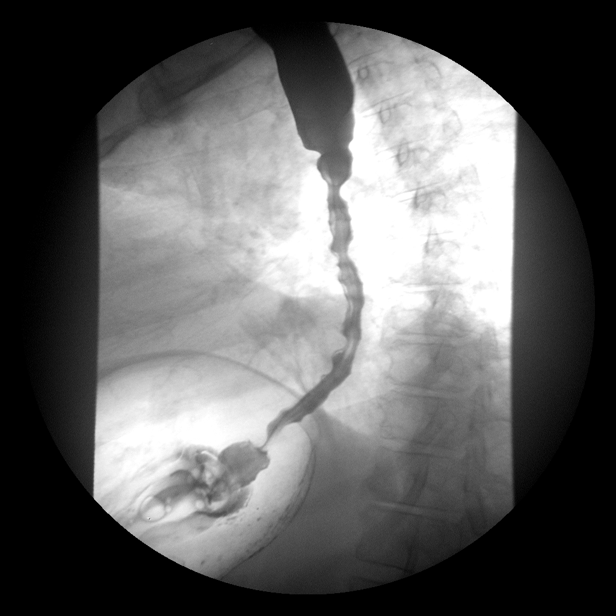

[Series 18: run · 1 of 1 slices shown (13 of 14)]
[im 1/1]
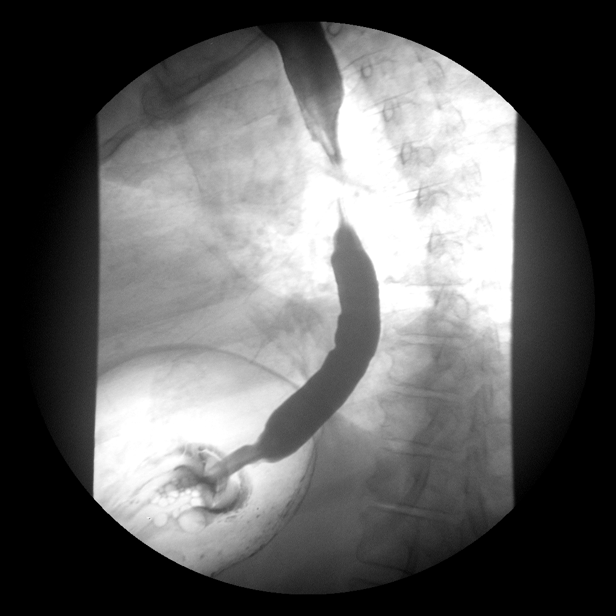

[Series 20: run · 1 of 1 slices shown (14 of 14)]
[im 1/1]
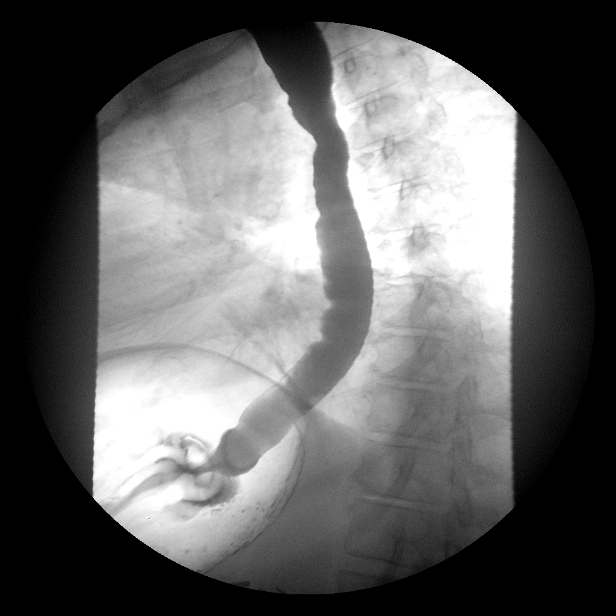

[14 of 20 positions shown; findings below may reference images not displayed]

FINDINGS: Mild to moderate esophageal dysmotility.

No fixed esophageal narrowing or stricture.

Apparent narrowing at the GE junction. However, on prone swallows,
the distal esophagus was confirmed to distended normally. Given
these findings, the appearance of the distal esophagus is favored to
reflect lower esophageal sphincter dysfunction.

A barium tablet was not administered due to esophageal dysmotility.
IMPRESSION: Mild to moderate esophageal dysmotility.

Suspected lower esophageal sphincter dysfunction rather than true
narrowing at the distal esophagus/ GE junction.

Normal caliber of the lower esophagus on prone swallows.

## 2016-02-23 DIAGNOSIS — F250 Schizoaffective disorder, bipolar type: Secondary | ICD-10-CM | POA: Diagnosis not present

## 2016-02-23 DIAGNOSIS — M6281 Muscle weakness (generalized): Secondary | ICD-10-CM | POA: Diagnosis not present

## 2016-02-23 DIAGNOSIS — I10 Essential (primary) hypertension: Secondary | ICD-10-CM | POA: Diagnosis not present

## 2016-02-23 DIAGNOSIS — E119 Type 2 diabetes mellitus without complications: Secondary | ICD-10-CM | POA: Diagnosis not present

## 2016-02-23 DIAGNOSIS — I69398 Other sequelae of cerebral infarction: Secondary | ICD-10-CM | POA: Diagnosis not present

## 2016-02-23 DIAGNOSIS — M545 Low back pain: Secondary | ICD-10-CM | POA: Diagnosis not present

## 2016-02-23 DIAGNOSIS — F17210 Nicotine dependence, cigarettes, uncomplicated: Secondary | ICD-10-CM | POA: Diagnosis not present

## 2016-02-23 DIAGNOSIS — R2681 Unsteadiness on feet: Secondary | ICD-10-CM | POA: Diagnosis not present

## 2016-02-23 DIAGNOSIS — J449 Chronic obstructive pulmonary disease, unspecified: Secondary | ICD-10-CM | POA: Diagnosis not present

## 2016-02-24 ENCOUNTER — Telehealth: Payer: Self-pay

## 2016-02-24 ENCOUNTER — Other Ambulatory Visit (HOSPITAL_COMMUNITY): Payer: Self-pay | Admitting: Interventional Radiology

## 2016-02-24 DIAGNOSIS — I69398 Other sequelae of cerebral infarction: Secondary | ICD-10-CM | POA: Diagnosis not present

## 2016-02-24 DIAGNOSIS — J449 Chronic obstructive pulmonary disease, unspecified: Secondary | ICD-10-CM | POA: Diagnosis not present

## 2016-02-24 DIAGNOSIS — I10 Essential (primary) hypertension: Secondary | ICD-10-CM | POA: Diagnosis not present

## 2016-02-24 DIAGNOSIS — M6281 Muscle weakness (generalized): Secondary | ICD-10-CM | POA: Diagnosis not present

## 2016-02-24 DIAGNOSIS — R2681 Unsteadiness on feet: Secondary | ICD-10-CM | POA: Diagnosis not present

## 2016-02-24 DIAGNOSIS — R040 Epistaxis: Secondary | ICD-10-CM

## 2016-02-24 DIAGNOSIS — F250 Schizoaffective disorder, bipolar type: Secondary | ICD-10-CM | POA: Diagnosis not present

## 2016-02-24 DIAGNOSIS — F17210 Nicotine dependence, cigarettes, uncomplicated: Secondary | ICD-10-CM | POA: Diagnosis not present

## 2016-02-24 DIAGNOSIS — E119 Type 2 diabetes mellitus without complications: Secondary | ICD-10-CM | POA: Diagnosis not present

## 2016-02-24 DIAGNOSIS — M545 Low back pain: Secondary | ICD-10-CM | POA: Diagnosis not present

## 2016-02-24 NOTE — Telephone Encounter (Signed)
Patient called to state that she had fallen on 02/23/2016 and hurt her shoulder and hit her head. Wanted to have xray ordered. I advised since she hit her head that she go to the ER. Stated she had no way and her home health pt was on his way and she was going to ask him what he thought and let me know what she decided to do

## 2016-02-24 NOTE — Telephone Encounter (Signed)
Noted and xray ordered and sent to [facility] to complete.

## 2016-02-24 NOTE — Telephone Encounter (Signed)
Ok mobile x ray of c spine

## 2016-02-25 ENCOUNTER — Ambulatory Visit (INDEPENDENT_AMBULATORY_CARE_PROVIDER_SITE_OTHER): Payer: Commercial Managed Care - HMO | Admitting: Psychiatry

## 2016-02-25 ENCOUNTER — Encounter (HOSPITAL_COMMUNITY): Payer: Self-pay | Admitting: Psychiatry

## 2016-02-25 DIAGNOSIS — F331 Major depressive disorder, recurrent, moderate: Secondary | ICD-10-CM

## 2016-02-25 NOTE — Progress Notes (Signed)
Patient:  [name]   DOB: 01/23/1951  MR Number: [id number]  Location: [place]:  [address] [place],  [place], [address]  Start: Friday 02/25/2016 9:20 AM  End: Friday 02/25/2016 10:10 AM  Provider/Observer:     Maurice Small, MSW, LCSW   Chief Complaint:     Depression  Reason For Service:     [name] is a 65 y.o. female who presents with a long standing history of recurrent periods of depression beginning when she was 63 and her favorite uncle died. She has been experiencing increased symptoms of depression recently due to stress in the relationship with her son. Per patient's report, he rarely visits her anymore due to his involvement with an older woman who is controlling. She also reports frustration regarding her knees who is her power of attorney and is very demanding per patient's report. She reports issues with other family members and states she needs help dealing with her family Patient reports multiple psychiatric hospitlaizations due to depression and suicidal ideations with thel last one occuring in 1997. Patient has participated in outpatient psychotherapy and medication management intermittently since age 37.  She currently is seeing psychiatrist Dr. Harrington Challenger . Prior to this, she was being seen at [facility]. Patient also has had ECT at [facility].    Interventions Strategy:  Supportive  Participation Level:   Active  Participation Quality:  Appropriate      Behavioral Observation:  Casual, Alert, and Tearful.   Current Psychosocial Factors: Tension in relationship with son, multiple health issues  Content of Session:   reviewed symptoms, facilitated expression of feelings, assisted patient  identify coping statements,discussed spirituality as coping tool  Current Status:   Depressed mood, anxiety, excessive worry  Suicidal/Homicidal:    No   Patient Progress:   Good. Patient reports learning earlier this week there is another tumor on her  brain. Per her report, doctor said it is not large enough to remove right now and will consider possible surgery next year. Patient report noticing increased confusion, short term memory difficulty, and poor concentration. She reports appropriate concern about tumor but is not overwhelmed by this. She reports using memories of overcoming previous tumor and her spirituality to cope. She also reports being vigilant about attending her medical appointments.  She continues to worry about son and grandson but is not as stressed by this. She does express frustration regarding now having to use a walker as she has been falling more frequently. trying  continued stress since last session.   Target Goals:   1.  Learn and implement behavioral strategies to overcome depression.  Last Reviewed:     Goals Addressed Today:    1,2  Plan:      Return again in 2 weeks.  Impression/Diagnosis:   Patient presents with long standing history of recurrent periods  of depression beginning when she was [age] and her favorite uncle died. Patient reports multiple psychiatric hospitlaizations due to depression and suicidal ideations with thel last one occuring in 1997. Patient has participated in outpatient psychotherapy and medication management intermittently since age 24.  She currently is seeing psychiatrist Dr. Harrington Challenger . Prior to this, she was being seen at [facility]. Patient also has had ECT at [facility]. Symptoms have worsened in recent months due to family stress. Current symptoms include depressed mood, anxiety, excessive worry, and tearfulness.   Diagnosis:  Axis I: MDD recurrent episode, moderate  Axis II: Deferred     Kaprice Kage, LCSW 02/25/2016

## 2016-02-28 ENCOUNTER — Ambulatory Visit: Payer: Commercial Managed Care - HMO | Admitting: Physical Therapy

## 2016-02-28 ENCOUNTER — Telehealth: Payer: Self-pay | Admitting: Family Medicine

## 2016-02-28 DIAGNOSIS — I10 Essential (primary) hypertension: Secondary | ICD-10-CM | POA: Diagnosis not present

## 2016-02-28 DIAGNOSIS — M545 Low back pain: Secondary | ICD-10-CM | POA: Diagnosis not present

## 2016-02-28 DIAGNOSIS — I69398 Other sequelae of cerebral infarction: Secondary | ICD-10-CM | POA: Diagnosis not present

## 2016-02-28 DIAGNOSIS — F17210 Nicotine dependence, cigarettes, uncomplicated: Secondary | ICD-10-CM | POA: Diagnosis not present

## 2016-02-28 DIAGNOSIS — F250 Schizoaffective disorder, bipolar type: Secondary | ICD-10-CM | POA: Diagnosis not present

## 2016-02-28 DIAGNOSIS — R2681 Unsteadiness on feet: Secondary | ICD-10-CM | POA: Diagnosis not present

## 2016-02-28 DIAGNOSIS — J449 Chronic obstructive pulmonary disease, unspecified: Secondary | ICD-10-CM | POA: Diagnosis not present

## 2016-02-28 DIAGNOSIS — M6281 Muscle weakness (generalized): Secondary | ICD-10-CM | POA: Diagnosis not present

## 2016-02-28 DIAGNOSIS — E119 Type 2 diabetes mellitus without complications: Secondary | ICD-10-CM | POA: Diagnosis not present

## 2016-02-29 NOTE — Telephone Encounter (Signed)
Opened in error

## 2016-03-01 ENCOUNTER — Ambulatory Visit: Payer: Self-pay | Admitting: Family Medicine

## 2016-03-01 DIAGNOSIS — M545 Low back pain: Secondary | ICD-10-CM | POA: Diagnosis not present

## 2016-03-01 DIAGNOSIS — L03032 Cellulitis of left toe: Secondary | ICD-10-CM | POA: Diagnosis not present

## 2016-03-01 DIAGNOSIS — F17210 Nicotine dependence, cigarettes, uncomplicated: Secondary | ICD-10-CM | POA: Diagnosis not present

## 2016-03-01 DIAGNOSIS — J449 Chronic obstructive pulmonary disease, unspecified: Secondary | ICD-10-CM | POA: Diagnosis not present

## 2016-03-01 DIAGNOSIS — M6281 Muscle weakness (generalized): Secondary | ICD-10-CM | POA: Diagnosis not present

## 2016-03-01 DIAGNOSIS — E119 Type 2 diabetes mellitus without complications: Secondary | ICD-10-CM | POA: Diagnosis not present

## 2016-03-01 DIAGNOSIS — I10 Essential (primary) hypertension: Secondary | ICD-10-CM | POA: Diagnosis not present

## 2016-03-01 DIAGNOSIS — R2681 Unsteadiness on feet: Secondary | ICD-10-CM | POA: Diagnosis not present

## 2016-03-01 DIAGNOSIS — I69398 Other sequelae of cerebral infarction: Secondary | ICD-10-CM | POA: Diagnosis not present

## 2016-03-01 DIAGNOSIS — F250 Schizoaffective disorder, bipolar type: Secondary | ICD-10-CM | POA: Diagnosis not present

## 2016-03-02 ENCOUNTER — Other Ambulatory Visit (HOSPITAL_COMMUNITY): Payer: Self-pay | Admitting: Psychiatry

## 2016-03-02 ENCOUNTER — Other Ambulatory Visit: Payer: Self-pay | Admitting: Family Medicine

## 2016-03-02 DIAGNOSIS — M150 Primary generalized (osteo)arthritis: Secondary | ICD-10-CM | POA: Diagnosis not present

## 2016-03-02 DIAGNOSIS — I639 Cerebral infarction, unspecified: Secondary | ICD-10-CM | POA: Diagnosis not present

## 2016-03-02 DIAGNOSIS — R32 Unspecified urinary incontinence: Secondary | ICD-10-CM | POA: Diagnosis not present

## 2016-03-03 NOTE — Telephone Encounter (Signed)
Called pt to verify pharmacy due to provider sending meds to a different phone then who is requesting meds

## 2016-03-03 NOTE — Telephone Encounter (Signed)
lmtcb and office number provided 

## 2016-03-07 DIAGNOSIS — M545 Low back pain: Secondary | ICD-10-CM | POA: Diagnosis not present

## 2016-03-07 DIAGNOSIS — E119 Type 2 diabetes mellitus without complications: Secondary | ICD-10-CM | POA: Diagnosis not present

## 2016-03-07 DIAGNOSIS — I10 Essential (primary) hypertension: Secondary | ICD-10-CM | POA: Diagnosis not present

## 2016-03-07 DIAGNOSIS — F17210 Nicotine dependence, cigarettes, uncomplicated: Secondary | ICD-10-CM | POA: Diagnosis not present

## 2016-03-07 DIAGNOSIS — F250 Schizoaffective disorder, bipolar type: Secondary | ICD-10-CM | POA: Diagnosis not present

## 2016-03-07 DIAGNOSIS — M6281 Muscle weakness (generalized): Secondary | ICD-10-CM | POA: Diagnosis not present

## 2016-03-07 DIAGNOSIS — J449 Chronic obstructive pulmonary disease, unspecified: Secondary | ICD-10-CM | POA: Diagnosis not present

## 2016-03-07 DIAGNOSIS — R2681 Unsteadiness on feet: Secondary | ICD-10-CM | POA: Diagnosis not present

## 2016-03-07 DIAGNOSIS — I69398 Other sequelae of cerebral infarction: Secondary | ICD-10-CM | POA: Diagnosis not present

## 2016-03-07 DIAGNOSIS — M79609 Pain in unspecified limb: Secondary | ICD-10-CM | POA: Diagnosis not present

## 2016-03-08 ENCOUNTER — Ambulatory Visit (INDEPENDENT_AMBULATORY_CARE_PROVIDER_SITE_OTHER): Payer: Commercial Managed Care - HMO | Admitting: Psychiatry

## 2016-03-08 ENCOUNTER — Encounter (HOSPITAL_COMMUNITY): Payer: Self-pay | Admitting: Psychiatry

## 2016-03-08 DIAGNOSIS — F331 Major depressive disorder, recurrent, moderate: Secondary | ICD-10-CM

## 2016-03-08 NOTE — Progress Notes (Signed)
Patient:  Audrie Mauthe Lafuente   DOB: March 08, 1951  MR Number: [id number]  Location: [facility]:  [address] [place],  [place], [address]  Start: Wednesday 03/08/2016 9:20 AM  End: Wednesday 12/27/201710:10 AM  Provider/Observer:     Maurice Small, MSW, LCSW   Chief Complaint:     Depression  Reason For Service:               Orlinda Ley Simerson is a 65 y.o. female who presents with a long standing history of recurrent periods of depression beginning when she was 33 and her favorite uncle died. She has been experiencing increased symptoms of depression recently due to stress in the relationship with her son. Per patient's report, he rarely visits her anymore due to his involvement with an older woman who is controlling. She also reports frustration regarding her knees who is her power of attorney and is very demanding per patient's report. She reports issues with other family members and states she needs help dealing with her family Patient reports multiple psychiatric hospitlaizations due to depression and suicidal ideations with thel last one occuring in 1997. Patient has participated in outpatient psychotherapy and medication management intermittently since age 82.  She currently is seeing psychiatrist Dr. Harrington Challenger . Prior to this, she was being seen at [facility]. Patient also has had ECT at [facility].    Interventions Strategy:  Supportive  Participation Level:   Active  Participation Quality:  Appropriate      Behavioral Observation:  Casual, Alert, and Tearful.   Current Psychosocial Factors: Tension in relationship with son, multiple health issues  Content of Session:   reviewed symptoms, facilitated expression of feelings, assisted patient identify strengths and supports, developed treatment plan  Current Status:   Improved mood, decreased anxiety, continued excessive worry  Suicidal/Homicidal:    No   Patient Progress:   Good. Patient reports having ups and downs since last  session. She reports enjoying celebrating Christmas with a friend who is like a son and his family. She also was pleased son and her two grandchildren visited her the day after Christmas. She continues to express frustration with son regarding his relationship with his son. She expresses increased acceptance she can't change son's behavior but still worries about grandson's future. Patient continues to rely on her spirituality and reports continued strong support from pastor and her church family.    Target Goals:   1.  Learn and implement behavioral strategies to overcome depression.    2.  Verbalize an understanding and resolution of current interpersonal problems.   Last Reviewed:   03/08/2016  Goals Addressed Today:    1,2  Plan:      Return again in 2 weeks.  Impression/Diagnosis:   Patient presents with long standing history of recurrent periods  of depression beginning when she was [age] and her favorite uncle died. Patient reports multiple psychiatric hospitlaizations due to depression and suicidal ideations with thel last one occuring in 1997. Patient has participated in outpatient psychotherapy and medication management intermittently since age 74.  She currently is seeing psychiatrist Dr. Harrington Challenger . Prior to this, she was being seen at [facility]. Patient also has had ECT at [facility]. Symptoms have worsened in recent months due to family stress. Current symptoms include depressed mood, anxiety, excessive worry, and tearfulness.   Diagnosis:  Axis I: MDD recurrent episode, moderate          Axis II: Deferred     Maricsa Sammons, LCSW  03/08/2016 

## 2016-03-09 ENCOUNTER — Ambulatory Visit: Payer: Self-pay | Admitting: Neurology

## 2016-03-09 DIAGNOSIS — J449 Chronic obstructive pulmonary disease, unspecified: Secondary | ICD-10-CM | POA: Diagnosis not present

## 2016-03-09 DIAGNOSIS — F250 Schizoaffective disorder, bipolar type: Secondary | ICD-10-CM | POA: Diagnosis not present

## 2016-03-09 DIAGNOSIS — M545 Low back pain: Secondary | ICD-10-CM | POA: Diagnosis not present

## 2016-03-09 DIAGNOSIS — I10 Essential (primary) hypertension: Secondary | ICD-10-CM | POA: Diagnosis not present

## 2016-03-09 DIAGNOSIS — F17210 Nicotine dependence, cigarettes, uncomplicated: Secondary | ICD-10-CM | POA: Diagnosis not present

## 2016-03-09 DIAGNOSIS — R2681 Unsteadiness on feet: Secondary | ICD-10-CM | POA: Diagnosis not present

## 2016-03-09 DIAGNOSIS — M6281 Muscle weakness (generalized): Secondary | ICD-10-CM | POA: Diagnosis not present

## 2016-03-09 DIAGNOSIS — I69398 Other sequelae of cerebral infarction: Secondary | ICD-10-CM | POA: Diagnosis not present

## 2016-03-09 DIAGNOSIS — E119 Type 2 diabetes mellitus without complications: Secondary | ICD-10-CM | POA: Diagnosis not present

## 2016-03-15 DIAGNOSIS — F250 Schizoaffective disorder, bipolar type: Secondary | ICD-10-CM | POA: Diagnosis not present

## 2016-03-15 DIAGNOSIS — M545 Low back pain: Secondary | ICD-10-CM | POA: Diagnosis not present

## 2016-03-15 DIAGNOSIS — M6281 Muscle weakness (generalized): Secondary | ICD-10-CM | POA: Diagnosis not present

## 2016-03-15 DIAGNOSIS — J449 Chronic obstructive pulmonary disease, unspecified: Secondary | ICD-10-CM | POA: Diagnosis not present

## 2016-03-15 DIAGNOSIS — I69398 Other sequelae of cerebral infarction: Secondary | ICD-10-CM | POA: Diagnosis not present

## 2016-03-15 DIAGNOSIS — Z9181 History of falling: Secondary | ICD-10-CM | POA: Diagnosis not present

## 2016-03-15 DIAGNOSIS — I10 Essential (primary) hypertension: Secondary | ICD-10-CM | POA: Diagnosis not present

## 2016-03-15 DIAGNOSIS — E119 Type 2 diabetes mellitus without complications: Secondary | ICD-10-CM | POA: Diagnosis not present

## 2016-03-15 DIAGNOSIS — F17210 Nicotine dependence, cigarettes, uncomplicated: Secondary | ICD-10-CM | POA: Diagnosis not present

## 2016-03-17 ENCOUNTER — Ambulatory Visit (HOSPITAL_COMMUNITY)
Admission: RE | Admit: 2016-03-17 | Discharge: 2016-03-17 | Disposition: A | Discharge status: Home / Self Care | Payer: Commercial Managed Care - HMO | Source: Ambulatory Visit | Attending: Interventional Radiology | Admitting: Interventional Radiology

## 2016-03-17 ENCOUNTER — Encounter (HOSPITAL_COMMUNITY): Payer: Self-pay | Admitting: Radiology

## 2016-03-17 DIAGNOSIS — R040 Epistaxis: Secondary | ICD-10-CM

## 2016-03-17 HISTORY — PX: IR GENERIC HISTORICAL: IMG1180011

## 2016-03-20 ENCOUNTER — Ambulatory Visit (INDEPENDENT_AMBULATORY_CARE_PROVIDER_SITE_OTHER): Payer: Commercial Managed Care - HMO | Admitting: Neurology

## 2016-03-20 ENCOUNTER — Encounter: Payer: Self-pay | Admitting: Neurology

## 2016-03-20 ENCOUNTER — Telehealth: Payer: Self-pay | Admitting: Neurology

## 2016-03-20 VITALS — BP 124/58 | HR 77 | Ht 59.0 in | Wt 167.1 lb

## 2016-03-20 DIAGNOSIS — R4189 Other symptoms and signs involving cognitive functions and awareness: Secondary | ICD-10-CM

## 2016-03-20 DIAGNOSIS — I69344 Monoplegia of lower limb following cerebral infarction affecting left non-dominant side: Secondary | ICD-10-CM | POA: Diagnosis not present

## 2016-03-20 DIAGNOSIS — Z720 Tobacco use: Secondary | ICD-10-CM

## 2016-03-20 DIAGNOSIS — G40209 Localization-related (focal) (partial) symptomatic epilepsy and epileptic syndromes with complex partial seizures, not intractable, without status epilepticus: Secondary | ICD-10-CM | POA: Diagnosis not present

## 2016-03-20 DIAGNOSIS — D329 Benign neoplasm of meninges, unspecified: Secondary | ICD-10-CM

## 2016-03-20 NOTE — Patient Instructions (Signed)
Continue Lamictal 100mg  twice daily Continue aspirin 81mg  daily Continue statin therapy as managed by PCP Quit smoking Follow up in 6 months.

## 2016-03-20 NOTE — Telephone Encounter (Signed)
Returned call. No answer. Left vmail.  

## 2016-03-20 NOTE — Progress Notes (Signed)
NEUROLOGY FOLLOW UP OFFICE NOTE  [name] [id number]  HISTORY OF PRESENT ILLNESS: [name] is a 66 year old right-handed woman with hypertension, stroke, type II diabetes, , seizure disorder, mononeuritis of leg, OSA, thyroid nodule, cervical disc disease, depression, anxiety, chronic pain, chronic back pain with gait instability and falls, polypharmacy, and history of stroke with residual left-sided weakness and meningioma resection in September 2013 who follows up for cerebrovascular disease with cognitive impairment and history of stroke, as well as seizures, migraines, and meningioma.     UPDATE: She reports having had a repeat MRI of the brain by Dr. Sherwood [name], her neurosurgeon, last month which showed recurrence of meningioma.  She reports increased nose bleed.  She saw ENT who said there was nothing he can do and the possibility of stopping ASA was raised.   HEADACHES/MIGRAINES: History:  She has longstanding history of multifactorial chronic headaches.  They are described as from the back of her head and top of the front of her head.  It is a pounding pain, 8/10.  There is some nausea, but no photophobia or phonophobia.  She has blurred vision, but that is constant.  She usually takes Tylenol for it, but she also is taking oxycodone for left arm pain.  She has history of medication-overuse and opioids for other pain.  Prior medications included lamotrigine, gabapentin, Lyrica, metoprolol, amitriptyline, and imipramine.  She also tried trigger point injections.  She is not a candidate for triptans due to history of stroke.   Update: stable   SEIZURES: History:  She has history of seizures for many years.  Her last seizure was about 3 years ago.  She described them as staring spells.  Her EEG from 08/08/12 revealed right temporal epileptiform discharges.     Since June 2016, she has had "spells" where she suddenly doesn't feel well.  She notes numbness in the left leg and will  sometimes fall during these spells.  She had a fall in late July.  There was no loss of consciousness.  MRI of the brain performed on 02/01/15 showed a tiny area of altered signal in left pons, which location doesn't correlate with symptoms and seems more likely to be T2 shine through.   Update:  [name].   MENINGIOMA: She had a known history of meningioma.  She presented to the ED in August 2013 with sudden onset numbness of the feet and left hand weakness and numbness.  MRI of brain noted enlargement of previous planum sphenoidale meningioma compared to prior study in 2009, with vasogenic edema.  In September 2013, she underwent a bicoronal craniotomy and gross total resection.  Repeat MRI of brain with and without contrast performed on 05/01/12 revealed postop resection of meningioma of the planum sphenoidale with small amount of residual enhancing tissue along the planum sphenoidale (5 x 838m, postop blood in the bilateral frontal lobes and bilateral encephalomalacia of the gyrus rectus bilaterally.   She had a repeat MRI of the brain with and without contrast performed on 02/22/15, which revealed on meningioma recurrence.   STROKE: She has a history of stroke going back several years ago.  Semiology is unclear but reports left-sided issues.  She was started on Plavix, but she was switched over to ASA 8149mShe was hospitalized in December 2016 for chest pain and worsening left arm weakness.  MRI of brain showed was personally reviewed and showed frontal encephalomalacia but no acute stroke or  meningioma recurrence.  MRI of cervical spine was personally reviewed and revealed mild spondylitic changes.  She was found to have bronchitis, so symptoms have have been recrudescence of prior stroke symptoms.   Carotid doppler from 11/06/14 showed bilateral atherosclerosis with less than 50% stenosis bilaterally.   Update: 01/22/16 LDL 73; 02/02/16 Hgb A1c 6.3     MEMORY: She also reports problems with memory, word-finding difficulties, and problems with name recognition of acquaintances of people at church.    01/22/16: CBC with WBC 6.5, HGB 10.8, HCT 36.6, PLT 218; CMP with Na 144, K 3.8, glucose 147, BUN 23, Cr 0.94, total bili 0.3, ALP 124, AST 19 and ALT 22.  PAST MEDICAL HISTORY: Past Medical History:  Diagnosis Date  . Anemia   . Anxiety    takes Ativan daily  . Arthritis   . Bipolar disorder ([place])    takes Risperdal nightly  . Blood transfusion   . Cancer ([place])    In her gum  . Carpal tunnel syndrome of right wrist 05/23/2011  . Cervical disc disorder with radiculopathy of cervical region 10/31/2012  . Chronic back pain   . Chronic idiopathic constipation   . Colon polyps   . COPD (chronic obstructive pulmonary disease) with chronic bronchitis ([place]) 09/16/2013   Office Spirometry 10/30/2013-submaximal effort based on appearance of loop and curve. Numbers would fit with severe restriction but her physiologic capability may be better than this. FVC 0.91/44%, and 10.74/45%, FEV1/FVC 0.81, FEF 25-75% 1.43/69%    . Depression    takes Zoloft daily  . Diabetes mellitus    Type II  . Diverticulosis    TCS 9/08 by Dr. Delfin Edis for diarrhea . Bx for micro scopic colitis negative.   . Fibromyalgia   . Frequent falls   . GERD (gastroesophageal reflux disease)    takes Aciphex daily  . Glaucoma    eye drops daily  . Gum symptoms    infection on antibiotic  . Hemiplegia affecting non-dominant side, post-stroke ([place]) 08/02/2011  . Hyperlipidemia    takes Crestor daily  . Hypertension    takes Amlodipine,Metoprolol,and Clonidine daily  . Hypothyroidism    takes Synthroid daily  . IBS (irritable bowel syndrome)   . Insomnia    takes Trazodone nightly  . Metabolic encephalopathy 0/53/9767  . Migraines    chronic headaches  . Mononeuritis lower limb   . Osteoporosis   . Pancreatitis 2006   due to Depakote with normal EUS   .  Schatzki's ring    non critical / EGD with ED 8/2011with RMR  . Seizures ([place])    takes Lamictal daily.Last seizure 3 yrs ago  . Sleep apnea    on CPAP  . Stroke [facility])    left sided weakness  . Tubular adenoma of colon     MEDICATIONS: Current Outpatient Prescriptions on File Prior to Visit  Medication Sig Dispense Refill  . ACCU-CHEK FASTCLIX LANCETS MISC Use to test blood sugar 4 times daily. Dx: E10.65 408 each 1  . ACCU-CHEK SMARTVIEW test strip TEST BLOOD SUGAR FOUR TIMES DAILY AS DIRECTED 350 each 2  . albuterol (PROVENTIL HFA;VENTOLIN HFA) 108 (90 BASE) MCG/ACT inhaler Inhale 1 puff into the lungs 3 (three) times daily. 1 Inhaler 2  . Alcohol Swabs (B-D SINGLE USE SWABS REGULAR) PADS Use for injections and glucose testing 8 times daily. Dx: E10.65 900 each 1  . amLODipine (NORVASC) 5 MG tablet TAKE 2 TABLETS BY MOUTH ONCE DAILY. 60 tablet  0  . aspirin EC 81 MG tablet Take 81 mg by mouth daily.    . Azelastine-Fluticasone (DYMISTA) 137-50 MCG/ACT SUSP Place 1 puff into the nose at bedtime. 1 Bottle 0  . BD PEN NEEDLE NANO U/F 32G X 4 MM MISC USE FOUR TIMES DAILY 360 each 3  . Blood Glucose Calibration (ACCU-CHEK SMARTVIEW CONTROL) LIQD 1 each by Other route as needed. 1 each 2  . Blood Glucose Monitoring Suppl (ACCU-CHEK NANO SMARTVIEW) W/DEVICE KIT 1 each by Does not apply route daily. Dx: E10.65 1 kit 0  . Cholecalciferol (VITAMIN D) 2000 units CAPS Take 1 capsule by mouth daily.    . clobetasol cream (TEMOVATE) 6.44 % Apply 1 application topically daily.    . cloNIDine (CATAPRES) 0.3 MG tablet TAKE 1 TABLET BY MOUTH EVERY EIGHT HOURS. ONCE AT 8AM, 4PM, AND 12 MIDNIGHT. 90 tablet 3  . diclofenac sodium (VOLTAREN) 1 % GEL Apply 2 g topically daily as needed (Pain). 100 g 2  . dicyclomine (BENTYL) 10 MG capsule TAKE 1 CAPSULE BY MOUTH THREE TIMES DAILY BEFORE MEALS. 90 capsule 3  . hydroxychloroquine (PLAQUENIL) 200 MG tablet Take 200 mg by mouth daily.    . Insulin Glargine  (TOUJEO SOLOSTAR) 300 UNIT/ML SOPN Inject 25 Units into the skin at bedtime. 6 pen 3  . lamoTRIgine (LAMICTAL) 100 MG tablet TAKE 1 TABLET BY MOUTH TWICE DAILY. 60 tablet 6  . levothyroxine (SYNTHROID, LEVOTHROID) 50 MCG tablet TAKE 1 TABLET BY MOUTH ONCE DAILY AND 1/2 TABLET ON SUNDAYS. 30 tablet 0  . LORazepam (ATIVAN) 0.5 MG tablet Take 1 tablet (0.5 mg total) by mouth 3 (three) times daily. 90 tablet 2  . losartan (COZAAR) 50 MG tablet TAKE ONE TABLET BY MOUTH DAILY. 90 tablet 1  . methocarbamol (ROBAXIN) 500 MG tablet Take 500 mg by mouth daily.    . metoprolol (LOPRESSOR) 50 MG tablet TAKE 1 TABLET BY MOUTH TWICE DAILY. 180 tablet 3  . montelukast (SINGULAIR) 10 MG tablet TAKE ONE TABLET BY MOUTH ONCE DAILY. 30 tablet 3  . Multiple Vitamins-Minerals (ONE-A-DAY 50 PLUS PO) Take 1 tablet by mouth daily.    [name] NOVOLOG FLEXPEN 100 UNIT/ML FlexPen INJECT 16 TO 30 UNITS INTO THE SKIN 3 (THREE) TIMES DAILY WITH MEALS. 90 mL 2  . Nystatin (NYAMYC) 100000 UNIT/GM POWD APPLY TO AFFECTED AREA 4 TIMES DAILY. 30 g 2  . ondansetron (ZOFRAN) 4 MG tablet Take 1 tablet (4 mg total) by mouth every 8 (eight) hours as needed for nausea or vomiting. 20 tablet 0  . Plecanatide (TRULANCE) 3 MG TABS Take 3 mg by mouth daily. 30 tablet 2  . pregabalin (LYRICA) 75 MG capsule Take 75 mg by mouth 2 (two) times daily.     . RABEprazole (ACIPHEX) 20 MG tablet TAKE 1 TABLET BY MOUTH TWICE DAILY. 60 tablet 5  . RESTASIS 0.05 % ophthalmic emulsion Place 1 drop into both eyes 2 (two) times daily.     . risperiDONE (RISPERDAL) 0.5 MG tablet Take 1 tablet (0.5 mg total) by mouth at bedtime. 30 tablet 3  . rosuvastatin (CRESTOR) 5 MG tablet TAKE 1 TABLET BY MOUTH AT BEDTIME. 30 tablet 3  . sertraline (ZOLOFT) 100 MG tablet Take 2 tablets (200 mg total) by mouth daily. 60 tablet 3  . traZODone (DESYREL) 150 MG tablet Take 2 tablets (300 mg total) by mouth at bedtime. 60 tablet 3  . vitamin B-12 (CYANOCOBALAMIN) 1000 MCG tablet  Take 1,000 mcg by mouth  daily.    . vitamin E 400 UNIT capsule Take 400 Units by mouth daily.     Current Facility-Administered Medications on File Prior to Visit  Medication Dose Route Frequency Provider Last Rate Last Dose  . 0.9 %  sodium chloride infusion  500 mL Intravenous Continuous Jerene Bears, MD        ALLERGIES: Allergies  Allergen Reactions  . Cephalexin Hives  . Iron Nausea And Vomiting  . Milk-Related Compounds Other (See Comments)    Doesn't agree with stomach.   . Penicillins Hives    Has patient had a PCN reaction causing immediate rash, facial/tongue/throat swelling, SOB or lightheadedness with hypotension: Yes Has patient had a PCN reaction causing severe rash involving mucus membranes or skin necrosis: No Has patient had a PCN reaction that required hospitalization No Has patient had a PCN reaction occurring within the last 10 years: No If all of the above answers are "NO", then may proceed with Cephalosporin use.   [name] Phenazopyridine Hcl Hives          FAMILY HISTORY: Family History  Problem Relation Age of Onset  . Heart attack Mother     HTN  . Pneumonia Father   . Kidney failure Father   . Diabetes Father   . Pancreatic cancer Sister   . Diabetes Brother   . Hypertension Brother   . Diabetes Brother   . Cancer Sister     breast   . Hypertension Son   . Sleep apnea Son   . Cancer Sister     pancreatic  . Stroke Maternal Grandmother   . Heart attack Maternal Grandfather   . Alcohol abuse Maternal Uncle   . Colon cancer Neg Hx   . Anesthesia problems Neg Hx   . Hypotension Neg Hx   . Malignant hyperthermia Neg Hx   . Pseudochol deficiency Neg Hx     SOCIAL HISTORY: Social History   Social History  . Marital status: Divorced    Spouse name: N/A  . Number of children: 1  . Years of education: 33   Occupational History  . disabled     Social History Main Topics  . Smoking status: Light Tobacco Smoker    Packs/day: 0.25    Years:  7.00    Types: Cigarettes  . Smokeless tobacco: Never Used     Comment: continues to smoke 1/4 pack a day   . Alcohol use No     Comment:    . Drug use: No  . Sexual activity: No   Other Topics Concern  . Not on file   Social History Narrative  . No narrative on file    REVIEW OF SYSTEMS: Constitutional: No fevers, chills, or sweats, no generalized fatigue, change in appetite Eyes: No visual changes, double vision, eye pain Ear, nose and throat: nose bleeds Cardiovascular: No chest pain, palpitations Respiratory:  No shortness of breath at rest or with exertion, wheezes GastrointestinaI: No nausea, vomiting, diarrhea, abdominal pain, fecal incontinence Genitourinary:  No dysuria, urinary retention or frequency Musculoskeletal:  No neck pain, back pain Integumentary: No rash, pruritus, skin lesions Neurological: as above Psychiatric: no anxiety Endocrine: No palpitations, fatigue, diaphoresis, mood swings, change in appetite, change in weight, increased thirst Hematologic/Lymphatic:  No purpura, petechiae. Allergic/Immunologic: no itchy/runny eyes, nasal congestion, recent allergic reactions, rashes  PHYSICAL EXAM: Vitals:   03/20/16 0910  BP: (!) 124/58  Pulse: 77   General: No acute distress.  Patient appears well-groomed.  normal body habitus. Head:  Normocephalic/atraumatic Eyes:  Fundi examined but not visualized Neck: supple, no paraspinal tenderness, full range of motion Heart:  Regular rate and rhythm Lungs:  Clear to auscultation bilaterally Back: No paraspinal tenderness Neurological Exam: alert and oriented to person, place, and time. Attention span and concentration intact, recent and remote memory intact, fund of knowledge intact.  Speech slowed and dysarthric, language intact.   CN II-XII intact. Fundi not visualized. Bulk and tone normal. 5-/5 left hip flexion, otherwise 5/5.  Reduced pinprick sensation to left arm and leg. DTR 1+ throughout, except absent  at ankles. Finger to nose slowed but no dysmetria. Wide-based gait.  IMPRESSION: left hemiplegia Hypersomnolence, chronic Localization-related epilepsy, stable History of meningioma resection Left-sided hemiplegia as late effect of stroke Memory deficits, multifactorial, related to vascular cognitive impairment, hypersomnolence and polypharmacy, stable Tobacco use  PLAN: 1.  Lamictal 186m twice daily 2.  ASA 842mdaily for secondary stroke prevention.  I would rather not to stop ASA as it would increase her risk for stroke. 3.  On Crestor as managed by PCP.  LDL goal should be less than 70 4.  Smoking cessation 5.  Will contact Dr. NuDonnella Biffice for recent MRI report 6.  Follow up in 6 months  15 minutes spent face to face with patient, over 50% spent discussing current symptoms and recent MRI findings.  AdMetta ClinesDO  CC: MaTula NakayamaMD

## 2016-03-20 NOTE — Telephone Encounter (Signed)
PT wants to know if Dr Tomi Likens can prescribe something for her to stop smoking/Dawn-OK to leave a message if she does not answer [phone]

## 2016-03-21 ENCOUNTER — Ambulatory Visit: Payer: Commercial Managed Care - HMO | Attending: Internal Medicine | Admitting: Physical Therapy

## 2016-03-21 ENCOUNTER — Encounter: Payer: Self-pay | Admitting: Physical Therapy

## 2016-03-21 ENCOUNTER — Telehealth: Payer: Self-pay | Admitting: Internal Medicine

## 2016-03-21 DIAGNOSIS — F250 Schizoaffective disorder, bipolar type: Secondary | ICD-10-CM | POA: Diagnosis not present

## 2016-03-21 DIAGNOSIS — M62838 Other muscle spasm: Secondary | ICD-10-CM | POA: Insufficient documentation

## 2016-03-21 DIAGNOSIS — M6281 Muscle weakness (generalized): Secondary | ICD-10-CM | POA: Diagnosis not present

## 2016-03-21 DIAGNOSIS — M545 Low back pain: Secondary | ICD-10-CM | POA: Diagnosis not present

## 2016-03-21 DIAGNOSIS — E119 Type 2 diabetes mellitus without complications: Secondary | ICD-10-CM | POA: Diagnosis not present

## 2016-03-21 DIAGNOSIS — I69398 Other sequelae of cerebral infarction: Secondary | ICD-10-CM | POA: Diagnosis not present

## 2016-03-21 DIAGNOSIS — F17210 Nicotine dependence, cigarettes, uncomplicated: Secondary | ICD-10-CM | POA: Diagnosis not present

## 2016-03-21 DIAGNOSIS — R279 Unspecified lack of coordination: Secondary | ICD-10-CM | POA: Insufficient documentation

## 2016-03-21 DIAGNOSIS — I10 Essential (primary) hypertension: Secondary | ICD-10-CM | POA: Diagnosis not present

## 2016-03-21 DIAGNOSIS — J449 Chronic obstructive pulmonary disease, unspecified: Secondary | ICD-10-CM | POA: Diagnosis not present

## 2016-03-21 DIAGNOSIS — Z9181 History of falling: Secondary | ICD-10-CM | POA: Diagnosis not present

## 2016-03-21 NOTE — Therapy (Signed)
[facility] [address] [place], [place], [address] Phone: [phone]   Fax:  [phone]  Physical Therapy Treatment  Patient Details  Name: [name] MRN: [id number] Date of Birth: 01/23/1951 Referring Provider: Dr. Zenovia Jarred  Encounter Date: 03/21/2016      PT End of Session - 03/21/16 0904    Visit Number 2   Number of Visits 10   Date for PT Re-Evaluation 03/14/16   Authorization Type g-code 10th visit;    PT Start Time 0845   PT Stop Time 0900   PT Time Calculation (min) 15 min   Activity Tolerance Patient limited by fatigue  patient fell asleep during therapy   Behavior During Therapy --  fell asleep      Past Medical History:  Diagnosis Date  . Anemia   . Anxiety    takes Ativan daily  . Arthritis   . Bipolar disorder ([place])    takes Risperdal nightly  . Blood transfusion   . Cancer ([place])    In her gum  . Carpal tunnel syndrome of right wrist 05/23/2011  . Cervical disc disorder with radiculopathy of cervical region 10/31/2012  . Chronic back pain   . Chronic idiopathic constipation   . Colon polyps   . COPD (chronic obstructive pulmonary disease) with chronic bronchitis ([place]) 09/16/2013   Office Spirometry 10/30/2013-submaximal effort based on appearance of loop and curve. Numbers would fit with severe restriction but her physiologic capability may be better than this. FVC 0.91/44%, and 10.74/45%, FEV1/FVC 0.81, FEF 25-75% 1.43/69%    . Depression    takes Zoloft daily  . Diabetes mellitus    Type II  . Diverticulosis    TCS 9/08 by Dr. Delfin Edis for diarrhea . Bx for micro scopic colitis negative.   . Fibromyalgia   . Frequent falls   . GERD (gastroesophageal reflux disease)    takes Aciphex daily  . Glaucoma    eye drops daily  . Gum symptoms    infection on antibiotic  . Hemiplegia affecting non-dominant side, post-stroke ([place]) 08/02/2011  . Hyperlipidemia    takes Crestor daily   . Hypertension    takes Amlodipine,Metoprolol,and Clonidine daily  . Hypothyroidism    takes Synthroid daily  . IBS (irritable bowel syndrome)   . Insomnia    takes Trazodone nightly  . Metabolic encephalopathy 2/42/3536  . Migraines    chronic headaches  . Mononeuritis lower limb   . Osteoporosis   . Pancreatitis 2006   due to Depakote with normal EUS   . Schatzki's ring    non critical / EGD with ED 8/2011with RMR  . Seizures ([place])    takes Lamictal daily.Last seizure 3 yrs ago  . Sleep apnea    on CPAP  . Stroke [facility])    left sided weakness  . Tubular adenoma of colon     Past Surgical History:  Procedure Laterality Date  . ABDOMINAL HYSTERECTOMY  1978  . BACK SURGERY  July 2012  . BACTERIAL OVERGROWTH TEST N/A 05/05/2013   Procedure: BACTERIAL OVERGROWTH TEST;  Surgeon: Daneil Dolin, MD;  [facility];  Service: Endoscopy;  Laterality: N/A;  7:30  . BIOPSY THYROID  2009  . BRAIN SURGERY  11/2011   resection of meningioma  . BREAST REDUCTION SURGERY  1994  . CARDIAC CATHETERIZATION  05/10/2005   normal coronaries, normal LV systolic function and EF (Dr. Jackie Plum)  . CARPAL TUNNEL  RELEASE Left 07/22/04   Dr. Aline Brochure  . CATARACT EXTRACTION Bilateral   . CHOLECYSTECTOMY  1984  . COLONOSCOPY N/A 09/25/2012   [id number]:WRUEAVW diverticulosis.  colonic polyp-removed : tubular adenoma  . CRANIOTOMY  11/23/2011   Procedure: CRANIOTOMY TUMOR EXCISION;  Surgeon: Hosie Spangle, MD;  Location: [place] NEURO ORS;  Service: Neurosurgery;  Laterality: N/A;  Craniotomy for tumor resection  . ESOPHAGOGASTRODUODENOSCOPY  12/29/2010   Rourk-Retained food in the esophagus and stomach, small hiatal hernia, status post Maloney dilation of the esophagus  . ESOPHAGOGASTRODUODENOSCOPY N/A 09/25/2012   [id number] atonic baggy esophagus status post Maloney dilation 25 F. Hiatal hernia  . GIVENS CAPSULE STUDY N/A 01/15/2013   NORMAL.   . IR GENERIC HISTORICAL  03/17/2016   IR RADIOLOGIST  EVAL & MGMT 03/17/2016 [facility]-INTERV RAD  . LESION REMOVAL N/A 05/31/2015   Procedure: REMOVAL RIGHT AND LEFT LESIONS OF MANDIBLE;  Surgeon: Diona Browner, DDS;  Location: [place];  Service: Oral Surgery;  Laterality: N/A;  . MALONEY DILATION  12/29/2010   RMR;  . NM MYOCAR PERF WALL MOTION  2006   "relavtiely normal" persantine, mild anterior thinning (breast attenuation artifact), no region of scar/ischemia  . OVARIAN CYST REMOVAL    . RECTOCELE REPAIR N/A 06/29/2015   Procedure: POSTERIOR REPAIR (RECTOCELE);  Surgeon: Jonnie Kind, MD;  Location: [facility] ORS;  Service: Gynecology;  Laterality: N/A;  . SPINE SURGERY  09/29/2010   Dr. Rolena Infante  . surgical excision of 3 tumors from right thigh and right buttock  and left upper thigh  2010  . TOOTH EXTRACTION Bilateral 12/14/2014   Procedure: REMOVAL OF BILATERAL MANDIBULAR EXOSTOSES;  Surgeon: Diona Browner, DDS;  Location: [facility] [facility];  Service: Oral Surgery;  Laterality: Bilateral;  . TRANSTHORACIC ECHOCARDIOGRAM  2010   EF 60-65%, mild conc LVH, grade 1 diastolic dysfunction; mildly calcified MV annulus with mildly thickened leaflets, mildly calcified MR annulus    There were no vitals filed for this visit.      Subjective Assessment - 03/21/16 0904    Subjective Patient reports she is getting home health physical therapy   Patient Stated Goals not strain to have a bowel movement   Currently in Pain? Other (Comment)  unable to assess due to patient falling asleep in therapy        g-code: functional assessment tool used is FOTO score is 70% limitation, functional limitation is Other PT primary, Goal status is CK, and discharge status is CL.  Earlie Counts, PT 03/21/16 9:12 AM                             PT Short Term Goals - 02/15/16 0833      PT SHORT TERM GOAL #1   Title independent with abdominal massage   Time 4   Period Weeks   Status On-going  still learning     PT SHORT TERM GOAL #2   Title ability to strain  </= 25% less due to increased pelvic floor strength and improved coordination   Time 4   Period Weeks   Status On-going     PT SHORT TERM GOAL #3   Title abdominal pain decreased >/= 25% due to ability to have a bowel movement   Period Weeks   Status On-going           PT Long Term Goals - 01/18/16 1416      PT LONG TERM GOAL #1  Title independent with HEP   Time 8   Period Weeks   Status New     PT LONG TERM GOAL #2   Title understand correct toileting technique using abdominal bracing and relaxation of the pelvic floor to not strain with a bowel movement   Time 8   Period Weeks   Status New     PT LONG TERM GOAL #3   Title ability to have a bowel movement with 50% less straining due to increased pelvic floor strength >/= 3/5   Time 8   Period Weeks   Status New     PT LONG TERM GOAL #4   Title abdominal pain decreased >/= 50% due to bowels able to move through the intestines with greater ease   Time 8   Period Weeks   Status New     PT LONG TERM GOAL #5   Title FOTO score </= 55% limitation   Time 8   Period Weeks   Status New     Additional Long Term Goals   Additional Long Term Goals Yes     PT LONG TERM GOAL #6   Title timed up and go test </=25 sec   Time 8   Period Weeks   Status New             Patient will benefit from skilled therapeutic intervention in order to improve the following deficits and impairments:     Visit Diagnosis: Muscle weakness (generalized)  Unspecified lack of coordination  Other muscle spasm     Problem List Patient Active Problem List   Diagnosis Date Noted  . Osteopenia 01/20/2016  . History of palpitations 08/09/2015  . Nausea without vomiting 08/09/2015  . Labile hypertension 08/03/2015  . Normal coronary arteries 08/03/2015  . Left-sided low back pain with left-sided sciatica 06/27/2015  . Left flank pain 06/27/2015  . Acute cystitis without hematuria 05/10/2015  . Poorly controlled type 2  diabetes mellitus with circulatory disorder ([place]) 05/06/2015  . Multinodular goiter 05/06/2015  . Rectocele, female 04/27/2015  . Anal sphincter incontinence 04/27/2015  . Pelvic relaxation due to rectocele 03/30/2015  . Pulmonary hypertension 02/22/2015  . Weakness of left upper extremity 02/21/2015  . Light cigarette smoker (1-9 cigarettes per day) 01/11/2015  . Annual physical exam 01/05/2015  . Migraine without aura and without status migrainosus, not intractable 07/02/2014  . Flatulence 02/18/2014  . Microcytic anemia 02/18/2014  . COPD (chronic obstructive pulmonary disease) with chronic bronchitis ([place]) 09/16/2013  . Hypothyroidism 08/16/2013  . Gastroparesis 04/28/2013  . Seizure disorder ([place]) 01/19/2013  . Cervical disc disorder with radiculopathy of cervical region 10/31/2012  . Solitary pulmonary nodule 08/19/2012  . Anemia 07/05/2012  . 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 Allergic sinusitis 04/18/2012  . Meningioma ([place]) 11/19/2011  . 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 Hemiplegia affecting non-dominant side, post-stroke ([place]) 08/02/2011  . Carpal tunnel syndrome of right wrist 05/23/2011  . Polypharmacy 04/28/2011  . Bipolar disorder ([place]) 04/28/2011  . Constipation 04/13/2011  . Falls frequently 12/12/2010  . Urinary incontinence 12/16/2009  . HEARING LOSS 10/26/2009  . Hyperlipidemia 12/11/2008  . IBS 12/11/2008  . GERD 07/29/2008  . MILK PRODUCTS ALLERGY 07/29/2008  . 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 Backache 06/19/2007  . Osteoporosis 06/19/2007  . Obstructive sleep apnea 06/19/2007  . TRIGGER FINGER 04/18/2007  . DIVERTICULOSIS, COLON 11/13/2006    Earlie Counts, PT 03/21/16 9:12 AM  [facility] [address], [place], [place], [address] Phone: [phone]   Fax:  [phone]  Name: [name] MRN:  [id number] Date of Birth: 1950-11-10  PHYSICAL THERAPY DISCHARGE SUMMARY  Visits from Start of Care: 2  Current functional level related to goals / functional outcomes: Unable to assess patient due to her sleeping.  Patient is too fatigue to answer too many questions.  Patient is not appropriate for home health physical therapy due to sleeping. Patient reports she is receiving physical therapy at home which is more appropriate.    Remaining deficits: Unable to assess due to falling asleep.   Education / Equipment: HEP but patient is doing home health physical therapy Plan: Patient agrees to discharge.  Patient goals were not met. Patient is being discharged due to                                                    falling asleep in therapy and receiving home health physical therapy. Earlie Counts, PT 03/21/16 9:12 AM   ?????

## 2016-03-21 NOTE — Telephone Encounter (Signed)
Patient been up since 3, am haven't eaten anything, b/s running in the 200dreds. Been sleeping a lot. Please advise.

## 2016-03-21 NOTE — Telephone Encounter (Signed)
[name], can you please give her a call to see what happened and to find out exactly what her sugars are throughout the day. Also, please let me know exactly what doses of insulin she is taking just in case she takes it differently than at last visit.

## 2016-03-22 ENCOUNTER — Telehealth: Payer: Self-pay

## 2016-03-22 NOTE — Telephone Encounter (Signed)
Ok to increase Toujeo to 30 units and let us know about sugars in 2 days.

## 2016-03-22 NOTE — Telephone Encounter (Signed)
Called patient and she stated she is not eating at all during the day and sugars are staying high.  Yesterday patient sugars were:  Breakfast 4:00 am- 199; she took 17 units of novolog. Nothing to eat or drink since breakfast. Lunch 12:30- 263; she took 17 units of novolog. Nothing to eat, drank a little Dinner - 190; she took 17 units of novolog. Before bed; she took 25 units of toujeo.   Sugar right now was 165 and nothing to eat this morning.   Please advise if you would like patient to come in sooner. Thank you!

## 2016-03-22 NOTE — Telephone Encounter (Signed)
Called and left patient a voicemail regarding the increase in toujeo to 30 units. Left call back number if patient did not understand or was confused.

## 2016-03-22 NOTE — Telephone Encounter (Signed)
Called and left voicemail for patient.

## 2016-03-23 DIAGNOSIS — M545 Low back pain: Secondary | ICD-10-CM | POA: Diagnosis not present

## 2016-03-23 DIAGNOSIS — F250 Schizoaffective disorder, bipolar type: Secondary | ICD-10-CM | POA: Diagnosis not present

## 2016-03-23 DIAGNOSIS — I10 Essential (primary) hypertension: Secondary | ICD-10-CM | POA: Diagnosis not present

## 2016-03-23 DIAGNOSIS — L600 Ingrowing nail: Secondary | ICD-10-CM | POA: Diagnosis not present

## 2016-03-23 DIAGNOSIS — J449 Chronic obstructive pulmonary disease, unspecified: Secondary | ICD-10-CM | POA: Diagnosis not present

## 2016-03-23 DIAGNOSIS — F17210 Nicotine dependence, cigarettes, uncomplicated: Secondary | ICD-10-CM | POA: Diagnosis not present

## 2016-03-23 DIAGNOSIS — I69398 Other sequelae of cerebral infarction: Secondary | ICD-10-CM | POA: Diagnosis not present

## 2016-03-23 DIAGNOSIS — Z9181 History of falling: Secondary | ICD-10-CM | POA: Diagnosis not present

## 2016-03-23 DIAGNOSIS — M6281 Muscle weakness (generalized): Secondary | ICD-10-CM | POA: Diagnosis not present

## 2016-03-23 DIAGNOSIS — E119 Type 2 diabetes mellitus without complications: Secondary | ICD-10-CM | POA: Diagnosis not present

## 2016-03-27 ENCOUNTER — Encounter (HOSPITAL_COMMUNITY): Payer: Self-pay | Admitting: Interventional Radiology

## 2016-03-27 ENCOUNTER — Telehealth: Payer: Self-pay | Admitting: Family Medicine

## 2016-03-27 ENCOUNTER — Other Ambulatory Visit: Payer: Self-pay | Admitting: Gastroenterology

## 2016-03-27 ENCOUNTER — Other Ambulatory Visit: Payer: Self-pay | Admitting: Internal Medicine

## 2016-03-27 DIAGNOSIS — I69398 Other sequelae of cerebral infarction: Secondary | ICD-10-CM | POA: Diagnosis not present

## 2016-03-27 DIAGNOSIS — Z9181 History of falling: Secondary | ICD-10-CM | POA: Diagnosis not present

## 2016-03-27 DIAGNOSIS — M545 Low back pain: Secondary | ICD-10-CM | POA: Diagnosis not present

## 2016-03-27 DIAGNOSIS — I10 Essential (primary) hypertension: Secondary | ICD-10-CM | POA: Diagnosis not present

## 2016-03-27 DIAGNOSIS — F250 Schizoaffective disorder, bipolar type: Secondary | ICD-10-CM | POA: Diagnosis not present

## 2016-03-27 DIAGNOSIS — E119 Type 2 diabetes mellitus without complications: Secondary | ICD-10-CM | POA: Diagnosis not present

## 2016-03-27 DIAGNOSIS — F17210 Nicotine dependence, cigarettes, uncomplicated: Secondary | ICD-10-CM | POA: Diagnosis not present

## 2016-03-27 DIAGNOSIS — M6281 Muscle weakness (generalized): Secondary | ICD-10-CM | POA: Diagnosis not present

## 2016-03-27 DIAGNOSIS — J449 Chronic obstructive pulmonary disease, unspecified: Secondary | ICD-10-CM | POA: Diagnosis not present

## 2016-03-27 NOTE — Telephone Encounter (Signed)
Patient is c/o bilateral leg and ankle swelling , please advise?

## 2016-03-28 DIAGNOSIS — Z9181 History of falling: Secondary | ICD-10-CM

## 2016-03-28 DIAGNOSIS — M6281 Muscle weakness (generalized): Secondary | ICD-10-CM | POA: Diagnosis not present

## 2016-03-28 DIAGNOSIS — M545 Low back pain: Secondary | ICD-10-CM | POA: Diagnosis not present

## 2016-03-28 DIAGNOSIS — E119 Type 2 diabetes mellitus without complications: Secondary | ICD-10-CM | POA: Diagnosis not present

## 2016-03-28 DIAGNOSIS — I69398 Other sequelae of cerebral infarction: Secondary | ICD-10-CM | POA: Diagnosis not present

## 2016-03-28 DIAGNOSIS — F17210 Nicotine dependence, cigarettes, uncomplicated: Secondary | ICD-10-CM | POA: Diagnosis not present

## 2016-03-28 DIAGNOSIS — Z794 Long term (current) use of insulin: Secondary | ICD-10-CM

## 2016-03-28 DIAGNOSIS — F250 Schizoaffective disorder, bipolar type: Secondary | ICD-10-CM

## 2016-03-28 DIAGNOSIS — I10 Essential (primary) hypertension: Secondary | ICD-10-CM

## 2016-03-28 DIAGNOSIS — J449 Chronic obstructive pulmonary disease, unspecified: Secondary | ICD-10-CM | POA: Diagnosis not present

## 2016-03-28 NOTE — Telephone Encounter (Signed)
Recommend leg elevation, salt restriction, no med change recommended , if worsening swelling with pND or orthopnea, ( having to prop head up on more pillows, unable to lie flat without sOB, or awakening sOB0 needs eval

## 2016-03-28 NOTE — Telephone Encounter (Signed)
Patient states x 1 week.  Please advise.  No shortness or breath or noted injury.

## 2016-04-01 ENCOUNTER — Other Ambulatory Visit: Payer: Self-pay | Admitting: Family Medicine

## 2016-04-02 DIAGNOSIS — I639 Cerebral infarction, unspecified: Secondary | ICD-10-CM | POA: Diagnosis not present

## 2016-04-02 DIAGNOSIS — R32 Unspecified urinary incontinence: Secondary | ICD-10-CM | POA: Diagnosis not present

## 2016-04-02 DIAGNOSIS — M150 Primary generalized (osteo)arthritis: Secondary | ICD-10-CM | POA: Diagnosis not present

## 2016-04-03 DIAGNOSIS — J449 Chronic obstructive pulmonary disease, unspecified: Secondary | ICD-10-CM | POA: Diagnosis not present

## 2016-04-03 DIAGNOSIS — M545 Low back pain: Secondary | ICD-10-CM | POA: Diagnosis not present

## 2016-04-03 DIAGNOSIS — E119 Type 2 diabetes mellitus without complications: Secondary | ICD-10-CM | POA: Diagnosis not present

## 2016-04-03 DIAGNOSIS — F250 Schizoaffective disorder, bipolar type: Secondary | ICD-10-CM | POA: Diagnosis not present

## 2016-04-03 DIAGNOSIS — M6281 Muscle weakness (generalized): Secondary | ICD-10-CM | POA: Diagnosis not present

## 2016-04-03 DIAGNOSIS — I10 Essential (primary) hypertension: Secondary | ICD-10-CM | POA: Diagnosis not present

## 2016-04-03 DIAGNOSIS — I69398 Other sequelae of cerebral infarction: Secondary | ICD-10-CM | POA: Diagnosis not present

## 2016-04-03 DIAGNOSIS — Z9181 History of falling: Secondary | ICD-10-CM | POA: Diagnosis not present

## 2016-04-03 DIAGNOSIS — F17210 Nicotine dependence, cigarettes, uncomplicated: Secondary | ICD-10-CM | POA: Diagnosis not present

## 2016-04-04 ENCOUNTER — Encounter (HOSPITAL_COMMUNITY): Payer: Self-pay | Admitting: Psychiatry

## 2016-04-04 ENCOUNTER — Ambulatory Visit (INDEPENDENT_AMBULATORY_CARE_PROVIDER_SITE_OTHER): Payer: Commercial Managed Care - HMO | Admitting: Psychiatry

## 2016-04-04 DIAGNOSIS — F331 Major depressive disorder, recurrent, moderate: Secondary | ICD-10-CM | POA: Diagnosis not present

## 2016-04-04 NOTE — Progress Notes (Signed)
Patient:  [name]   DOB: [birth year]  MR Number: [id number]  Location: [place]:  [address] [place],  [place], [address]  Start: Tuesday 04/04/2016 9:27 AM  End: Tuesday 04/04/2016 10:02 AM  Provider/Observer:     Maurice Small, MSW, LCSW   Chief Complaint:     Depression  Reason For Service:               [name] is a 66 y.o. female who presents with a long standing history of recurrent periods of depression beginning when she was 53 and her favorite uncle died. She has been experiencing increased symptoms of depression recently due to stress in the relationship with her son. Per patient's report, he rarely visits her anymore due to his involvement with an older woman who is controlling. She also reports frustration regarding her knees who is her power of attorney and is very demanding per patient's report. She reports issues with other family members and states she needs help dealing with her family Patient reports multiple psychiatric hospitlaizations due to depression and suicidal ideations with thel last one occuring in 1997. Patient has participated in outpatient psychotherapy and medication management intermittently since age 59.  She currently is seeing psychiatrist Dr. Harrington Challenger . Prior to this, she was being seen at [facility]. Patient also has had ECT at [facility].    Interventions Strategy:  Supportive  Participation Level:   Active  Participation Quality:  Appropriate      Behavioral Observation:  Casual, well groomed, drowsy, speech very slow   Current Psychosocial Factors: Tension in relationship with son, multiple health issues, concerns about grandson  Content of Session:   reviewed symptoms, facilitated expression of feelings, discussed thought stopping technique to manage worry, identified distracting activities and ways to use spirituality  Current Status:   Increased depressed  mood, increased anxiety, continued excessive  worry  Suicidal/Homicidal:    No   Patient Progress:   Fair. Patient reports increased depressed mood for the past 3-4 days as she says her grandson has not answered her phone calls for the past two weeks. She says his mother told her he has been staying in his room. Patient fears grandson is depressed and needs professional help. She continues to express frustration regarding her son's relationship with her grandson. Target Goals:   1.  Learn and implement behavioral strategies to overcome depression.    2.  Verbalize an understanding and resolution of current interpersonal problems.   Last Reviewed:   03/08/2016  Goals Addressed Today:    1,  Plan:      Return again in 2 weeks.  Impression/Diagnosis:   Patient presents with long standing history of recurrent periods  of depression beginning when she was [age] and her favorite uncle died. Patient reports multiple psychiatric hospitlaizations due to depression and suicidal ideations with thel last one occuring in 1997. Patient has participated in outpatient psychotherapy and medication management intermittently since age 36.  She currently is seeing psychiatrist Dr. Harrington Challenger . Prior to this, she was being seen at [facility]. Patient also has had ECT at [facility]. Symptoms have worsened in recent months due to family stress. Current symptoms include depressed mood, anxiety, excessive worry, and tearfulness.   Diagnosis:  Axis I: MDD recurrent episode, moderate          Axis II: Deferred     [name] Vallee, LCSW 04/04/2016

## 2016-04-06 ENCOUNTER — Other Ambulatory Visit (HOSPITAL_COMMUNITY): Payer: Self-pay | Admitting: Psychiatry

## 2016-04-06 DIAGNOSIS — M6281 Muscle weakness (generalized): Secondary | ICD-10-CM | POA: Diagnosis not present

## 2016-04-06 DIAGNOSIS — Z9181 History of falling: Secondary | ICD-10-CM | POA: Diagnosis not present

## 2016-04-06 DIAGNOSIS — E119 Type 2 diabetes mellitus without complications: Secondary | ICD-10-CM | POA: Diagnosis not present

## 2016-04-06 DIAGNOSIS — J449 Chronic obstructive pulmonary disease, unspecified: Secondary | ICD-10-CM | POA: Diagnosis not present

## 2016-04-06 DIAGNOSIS — I10 Essential (primary) hypertension: Secondary | ICD-10-CM | POA: Diagnosis not present

## 2016-04-06 DIAGNOSIS — F17210 Nicotine dependence, cigarettes, uncomplicated: Secondary | ICD-10-CM | POA: Diagnosis not present

## 2016-04-06 DIAGNOSIS — I69398 Other sequelae of cerebral infarction: Secondary | ICD-10-CM | POA: Diagnosis not present

## 2016-04-06 DIAGNOSIS — F250 Schizoaffective disorder, bipolar type: Secondary | ICD-10-CM | POA: Diagnosis not present

## 2016-04-06 DIAGNOSIS — M545 Low back pain: Secondary | ICD-10-CM | POA: Diagnosis not present

## 2016-04-07 DIAGNOSIS — M79609 Pain in unspecified limb: Secondary | ICD-10-CM | POA: Diagnosis not present

## 2016-04-10 DIAGNOSIS — J449 Chronic obstructive pulmonary disease, unspecified: Secondary | ICD-10-CM | POA: Diagnosis not present

## 2016-04-10 DIAGNOSIS — Z9181 History of falling: Secondary | ICD-10-CM | POA: Diagnosis not present

## 2016-04-10 DIAGNOSIS — E119 Type 2 diabetes mellitus without complications: Secondary | ICD-10-CM | POA: Diagnosis not present

## 2016-04-10 DIAGNOSIS — I69398 Other sequelae of cerebral infarction: Secondary | ICD-10-CM | POA: Diagnosis not present

## 2016-04-10 DIAGNOSIS — I10 Essential (primary) hypertension: Secondary | ICD-10-CM | POA: Diagnosis not present

## 2016-04-10 DIAGNOSIS — M6281 Muscle weakness (generalized): Secondary | ICD-10-CM | POA: Diagnosis not present

## 2016-04-10 DIAGNOSIS — M545 Low back pain: Secondary | ICD-10-CM | POA: Diagnosis not present

## 2016-04-10 DIAGNOSIS — F250 Schizoaffective disorder, bipolar type: Secondary | ICD-10-CM | POA: Diagnosis not present

## 2016-04-10 DIAGNOSIS — F17210 Nicotine dependence, cigarettes, uncomplicated: Secondary | ICD-10-CM | POA: Diagnosis not present

## 2016-04-11 ENCOUNTER — Telehealth: Payer: Self-pay

## 2016-04-11 NOTE — Telephone Encounter (Signed)
fyi

## 2016-04-13 DIAGNOSIS — M6281 Muscle weakness (generalized): Secondary | ICD-10-CM | POA: Diagnosis not present

## 2016-04-13 DIAGNOSIS — M792 Neuralgia and neuritis, unspecified: Secondary | ICD-10-CM | POA: Diagnosis not present

## 2016-04-13 DIAGNOSIS — F250 Schizoaffective disorder, bipolar type: Secondary | ICD-10-CM | POA: Diagnosis not present

## 2016-04-13 DIAGNOSIS — E119 Type 2 diabetes mellitus without complications: Secondary | ICD-10-CM | POA: Diagnosis not present

## 2016-04-13 DIAGNOSIS — J449 Chronic obstructive pulmonary disease, unspecified: Secondary | ICD-10-CM | POA: Diagnosis not present

## 2016-04-13 DIAGNOSIS — Z9181 History of falling: Secondary | ICD-10-CM | POA: Diagnosis not present

## 2016-04-13 DIAGNOSIS — I69398 Other sequelae of cerebral infarction: Secondary | ICD-10-CM | POA: Diagnosis not present

## 2016-04-13 DIAGNOSIS — I10 Essential (primary) hypertension: Secondary | ICD-10-CM | POA: Diagnosis not present

## 2016-04-13 DIAGNOSIS — F17210 Nicotine dependence, cigarettes, uncomplicated: Secondary | ICD-10-CM | POA: Diagnosis not present

## 2016-04-13 DIAGNOSIS — M545 Low back pain: Secondary | ICD-10-CM | POA: Diagnosis not present

## 2016-04-13 NOTE — Telephone Encounter (Signed)
Patient aware.

## 2016-04-17 DIAGNOSIS — M5441 Lumbago with sciatica, right side: Secondary | ICD-10-CM | POA: Diagnosis not present

## 2016-04-18 ENCOUNTER — Other Ambulatory Visit: Payer: Self-pay | Admitting: Family Medicine

## 2016-04-18 ENCOUNTER — Encounter: Payer: Self-pay | Admitting: Family Medicine

## 2016-04-18 ENCOUNTER — Ambulatory Visit (INDEPENDENT_AMBULATORY_CARE_PROVIDER_SITE_OTHER): Payer: Commercial Managed Care - HMO | Admitting: Family Medicine

## 2016-04-18 VITALS — BP 158/70 | HR 77 | Resp 16 | Ht 59.0 in | Wt 168.0 lb

## 2016-04-18 DIAGNOSIS — M6281 Muscle weakness (generalized): Secondary | ICD-10-CM | POA: Diagnosis not present

## 2016-04-18 DIAGNOSIS — I10 Essential (primary) hypertension: Secondary | ICD-10-CM | POA: Diagnosis not present

## 2016-04-18 DIAGNOSIS — E119 Type 2 diabetes mellitus without complications: Secondary | ICD-10-CM | POA: Diagnosis not present

## 2016-04-18 DIAGNOSIS — L97521 Non-pressure chronic ulcer of other part of left foot limited to breakdown of skin: Secondary | ICD-10-CM | POA: Insufficient documentation

## 2016-04-18 DIAGNOSIS — F17210 Nicotine dependence, cigarettes, uncomplicated: Secondary | ICD-10-CM | POA: Diagnosis not present

## 2016-04-18 DIAGNOSIS — F250 Schizoaffective disorder, bipolar type: Secondary | ICD-10-CM | POA: Diagnosis not present

## 2016-04-18 DIAGNOSIS — Z9181 History of falling: Secondary | ICD-10-CM | POA: Diagnosis not present

## 2016-04-18 DIAGNOSIS — L97522 Non-pressure chronic ulcer of other part of left foot with fat layer exposed: Secondary | ICD-10-CM

## 2016-04-18 DIAGNOSIS — E785 Hyperlipidemia, unspecified: Secondary | ICD-10-CM | POA: Diagnosis not present

## 2016-04-18 DIAGNOSIS — Z6833 Body mass index (BMI) 33.0-33.9, adult: Secondary | ICD-10-CM

## 2016-04-18 DIAGNOSIS — E662 Morbid (severe) obesity with alveolar hypoventilation: Secondary | ICD-10-CM | POA: Diagnosis not present

## 2016-04-18 DIAGNOSIS — I69398 Other sequelae of cerebral infarction: Secondary | ICD-10-CM | POA: Diagnosis not present

## 2016-04-18 DIAGNOSIS — F319 Bipolar disorder, unspecified: Secondary | ICD-10-CM | POA: Diagnosis not present

## 2016-04-18 DIAGNOSIS — M810 Age-related osteoporosis without current pathological fracture: Secondary | ICD-10-CM

## 2016-04-18 DIAGNOSIS — J449 Chronic obstructive pulmonary disease, unspecified: Secondary | ICD-10-CM | POA: Diagnosis not present

## 2016-04-18 DIAGNOSIS — R0989 Other specified symptoms and signs involving the circulatory and respiratory systems: Secondary | ICD-10-CM

## 2016-04-18 DIAGNOSIS — M545 Low back pain: Secondary | ICD-10-CM | POA: Diagnosis not present

## 2016-04-18 NOTE — Patient Instructions (Addendum)
Wellness visit June 21 or after, call if you need me before  M D f/u in 6 month  You will be referred for podiatry to reassess you on Friday or next week due to foot pain and swelling with ulcer on left 2nd toe  Labs today lipid , vit D  Please work on smoking cessation   Steps to Quit Smoking Smoking tobacco can be bad for your health. It can also affect almost every organ in your body. Smoking puts you and people around you at risk for many serious long-lasting (chronic) diseases. Quitting smoking is hard, but it is one of the best things that you can do for your health. It is never too late to quit. What are the benefits of quitting smoking? When you quit smoking, you lower your risk for getting serious diseases and conditions. They can include:  Lung cancer or lung disease.  Heart disease.  Stroke.  Heart attack.  Not being able to have children (infertility).  Weak bones (osteoporosis) and broken bones (fractures). If you have coughing, wheezing, and shortness of breath, those symptoms may get better when you quit. You may also get sick less often. If you are pregnant, quitting smoking can help to lower your chances of having a baby of low birth weight. What can I do to help me quit smoking? Talk with your doctor about what can help you quit smoking. Some things you can do (strategies) include:  Quitting smoking totally, instead of slowly cutting back how much you smoke over a period of time.  Going to in-person counseling. You are more likely to quit if you go to many counseling sessions.  Using resources and support systems, such as:  Online chats with a Social worker.  Phone quitlines.  Printed Furniture conservator/restorer.  Support groups or group counseling.  Text messaging programs.  Mobile phone apps or applications.  Taking medicines. Some of these medicines may have nicotine in them. If you are pregnant or breastfeeding, do not take any medicines to quit smoking  unless your doctor says it is okay. Talk with your doctor about counseling or other things that can help you. Talk with your doctor about using more than one strategy at the same time, such as taking medicines while you are also going to in-person counseling. This can help make quitting easier. What things can I do to make it easier to quit? Quitting smoking might feel very hard at first, but there is a lot that you can do to make it easier. Take these steps:  Talk to your family and friends. Ask them to support and encourage you.  Call phone quitlines, reach out to support groups, or work with a Social worker.  Ask people who smoke to not smoke around you.  Avoid places that make you want (trigger) to smoke, such as:  Bars.  Parties.  Smoke-break areas at work.  Spend time with people who do not smoke.  Lower the stress in your life. Stress can make you want to smoke. Try these things to help your stress:  Getting regular exercise.  Deep-breathing exercises.  Yoga.  Meditating.  Doing a body scan. To do this, close your eyes, focus on one area of your body at a time from head to toe, and notice which parts of your body are tense. Try to relax the muscles in those areas.  Download or buy apps on your mobile phone or tablet that can help you stick to your quit plan. There are  many free apps, such as QuitGuide from the [employer] Office manager for Disease Control and Prevention). You can find more support from smokefree.gov and other websites. This information is not intended to replace advice given to you by your health care provider. Make sure you discuss any questions you have with your health care provider. Document Released: 12/24/2008 Document Revised: 10/26/2015 Document Reviewed: 07/14/2014 Elsevier Interactive Patient Education  2017 [employer].

## 2016-04-18 NOTE — Assessment & Plan Note (Addendum)
2 week h/o left foot pain and swelling with left toe ulcer, s/p removal of left 2nd toenail, refer for re eval by podiatry, no purulent drainage from area

## 2016-04-19 ENCOUNTER — Telehealth: Payer: Self-pay | Admitting: Family Medicine

## 2016-04-19 NOTE — Telephone Encounter (Signed)
[name] is stating that she needs a new rolling walker and she is asking if [name] would send a written Rx with her diagnosis, along with a facesheet to [facility] faxed to [phone], please advise?

## 2016-04-19 NOTE — Telephone Encounter (Signed)
Please advise if you agree.

## 2016-04-20 DIAGNOSIS — E119 Type 2 diabetes mellitus without complications: Secondary | ICD-10-CM | POA: Diagnosis not present

## 2016-04-20 DIAGNOSIS — F250 Schizoaffective disorder, bipolar type: Secondary | ICD-10-CM | POA: Diagnosis not present

## 2016-04-20 DIAGNOSIS — M6281 Muscle weakness (generalized): Secondary | ICD-10-CM | POA: Diagnosis not present

## 2016-04-20 DIAGNOSIS — J449 Chronic obstructive pulmonary disease, unspecified: Secondary | ICD-10-CM | POA: Diagnosis not present

## 2016-04-20 DIAGNOSIS — M545 Low back pain: Secondary | ICD-10-CM | POA: Diagnosis not present

## 2016-04-20 DIAGNOSIS — I10 Essential (primary) hypertension: Secondary | ICD-10-CM | POA: Diagnosis not present

## 2016-04-20 DIAGNOSIS — Z9181 History of falling: Secondary | ICD-10-CM | POA: Diagnosis not present

## 2016-04-20 DIAGNOSIS — I69398 Other sequelae of cerebral infarction: Secondary | ICD-10-CM | POA: Diagnosis not present

## 2016-04-20 DIAGNOSIS — F17210 Nicotine dependence, cigarettes, uncomplicated: Secondary | ICD-10-CM | POA: Diagnosis not present

## 2016-04-20 LAB — LIPID PANEL W/O CHOL/HDL RATIO
Cholesterol, Total: 158 mg/dL (ref 100–199)
HDL: 44 mg/dL (ref >39)
LDL CALC: 75 mg/dL (ref 0–99)
Triglycerides: 195 mg/dL — ABNORMAL HIGH (ref 0–149)
VLDL CHOLESTEROL CAL: 39 mg/dL (ref 5–40)

## 2016-04-20 LAB — VITAMIN D 25 HYDROXY (VIT D DEFICIENCY, FRACTURES): VIT D 25 HYDROXY: 52.1 ng/mL (ref 30.0–100.0)

## 2016-04-20 LAB — AMBIG ABBREV LP DEFAULT

## 2016-04-20 NOTE — Telephone Encounter (Signed)
Noted  

## 2016-04-20 NOTE — Telephone Encounter (Signed)
May order,  Dx is recurrent falls and unsteady gait

## 2016-04-24 ENCOUNTER — Other Ambulatory Visit: Payer: Self-pay | Admitting: Internal Medicine

## 2016-04-24 ENCOUNTER — Encounter: Payer: Self-pay | Admitting: Family Medicine

## 2016-04-24 DIAGNOSIS — Z6833 Body mass index (BMI) 33.0-33.9, adult: Secondary | ICD-10-CM

## 2016-04-24 DIAGNOSIS — M545 Low back pain: Secondary | ICD-10-CM | POA: Diagnosis not present

## 2016-04-24 DIAGNOSIS — I69398 Other sequelae of cerebral infarction: Secondary | ICD-10-CM | POA: Diagnosis not present

## 2016-04-24 DIAGNOSIS — J449 Chronic obstructive pulmonary disease, unspecified: Secondary | ICD-10-CM | POA: Diagnosis not present

## 2016-04-24 DIAGNOSIS — Z9181 History of falling: Secondary | ICD-10-CM | POA: Diagnosis not present

## 2016-04-24 DIAGNOSIS — E119 Type 2 diabetes mellitus without complications: Secondary | ICD-10-CM | POA: Diagnosis not present

## 2016-04-24 DIAGNOSIS — Z720 Tobacco use: Secondary | ICD-10-CM | POA: Insufficient documentation

## 2016-04-24 DIAGNOSIS — E669 Obesity, unspecified: Secondary | ICD-10-CM | POA: Insufficient documentation

## 2016-04-24 DIAGNOSIS — I10 Essential (primary) hypertension: Secondary | ICD-10-CM | POA: Diagnosis not present

## 2016-04-24 DIAGNOSIS — M2040 Other hammer toe(s) (acquired), unspecified foot: Secondary | ICD-10-CM | POA: Diagnosis not present

## 2016-04-24 DIAGNOSIS — F17200 Nicotine dependence, unspecified, uncomplicated: Secondary | ICD-10-CM | POA: Insufficient documentation

## 2016-04-24 DIAGNOSIS — F250 Schizoaffective disorder, bipolar type: Secondary | ICD-10-CM | POA: Diagnosis not present

## 2016-04-24 DIAGNOSIS — M792 Neuralgia and neuritis, unspecified: Secondary | ICD-10-CM | POA: Diagnosis not present

## 2016-04-24 DIAGNOSIS — M2042 Other hammer toe(s) (acquired), left foot: Secondary | ICD-10-CM | POA: Diagnosis not present

## 2016-04-24 DIAGNOSIS — F17210 Nicotine dependence, cigarettes, uncomplicated: Secondary | ICD-10-CM | POA: Diagnosis not present

## 2016-04-24 DIAGNOSIS — M6281 Muscle weakness (generalized): Secondary | ICD-10-CM | POA: Diagnosis not present

## 2016-04-24 DIAGNOSIS — E1151 Type 2 diabetes mellitus with diabetic peripheral angiopathy without gangrene: Secondary | ICD-10-CM | POA: Diagnosis not present

## 2016-04-24 NOTE — Progress Notes (Signed)
[name]     MRN: [id number]      DOB: January 21, 1951   HPI [name] is here for follow up and re-evaluation of chronic medical conditions, medication management and review of any available recent lab and radiology data.  Preventive health is updated, specifically  Cancer screening and Immunization.   Questions or concerns regarding consultations or procedures which the PT has had in the interim are  addressed. The PT denies any adverse reactions to current medications since the last visit.  There are no new concerns.  There are no specific complaints   ROS Denies recent fever or chills. Denies sinus pressure, nasal congestion, ear pain or sore throat. Denies chest congestion, productive cough or wheezing. Denies chest pains, palpitations and leg swelling Denies abdominal pain, nausea, vomiting,diarrhea or constipation.   Denies dysuria, frequency, hesitancy or incontinence. Denies joint pain, swelling and limitation in mobility. Denies headaches, seizures, numbness, or tingling. Denies depression, anxiety or insomnia. Denies skin break down or rash.   PE  BP (!) 158/70   Pulse 77   Resp 16   Ht 4\' 11"  (1.499 m)   Wt 168 lb (76.2 kg)   SpO2 100%   BMI 33.93 kg/m   Patient alert and oriented and in no cardiopulmonary distress.  HEENT: No facial asymmetry, EOMI,   oropharynx pink and moist.  Neck decreased ROM, no JVD, no mass.  Chest: Clear to auscultation bilaterally.  CVS: S1, S2 no murmurs, no S3.Regular rate.  ABD: Soft non tender.   Ext: No edema  MS: Adequate though reduced  ROM spine, shoulders, hips and knees.  Skin: ulcer on left 2nd toe to fat layer, toe is hyperpigmented  Psych: Good eye contact, normal affect. Memory intact not anxious or depressed appearing.  CNS: CN 2-12 intact, power,  normal throughout.no focal deficits noted.   Assessment & Plan  Toe ulcer, left, with fat layer exposed ([place]) 2 week h/o left foot pain and swelling with left  toe ulcer, s/p removal of left 2nd toenail, refer for re eval by podiatry, no purulent drainage from area  Labile hypertension Elevated at this visit , . No changes ,made DASH diet and commitment to daily physical activity for a minimum of 30 minutes discussed and encouraged, as a part of hypertension management. The importance of attaining a healthy weight is also discussed.  BP/Weight 04/18/2016 03/20/2016 02/10/2016 02/02/2016 01/20/2016 01/05/2016 [id number]  Systolic BP 0000000 [id number] 0000000 [id number]  Diastolic BP 70 58 79 62 70 50 58  Wt. (Lbs) 168 167.1 165 162.6 163 165 160  BMI 33.93 33.75 33.33 32.84 32.92 33.05 30.23  Some encounter information is confidential and restricted. Go to Review Flowsheets activity to see all data.       Hyperlipidemia Hyperlipidemia:Low fat diet discussed and encouraged.   Lipid Panel  Lab Results  Component Value Date   CHOL 158 04/18/2016   HDL 44 04/18/2016   LDLCALC 75 04/18/2016   TRIG 195 (H) 04/18/2016   CHOLHDL 3.1 07/19/2015  need sto reduce dietary  Fat due to high TG   Bipolar disorder (HCC) Treated by psych and stable  Cigarette smoker Patient counseled for approximately 5 minutes regarding the health risks of ongoing nicotine use, specifically all types of cancer, heart disease, stroke and respiratory failure. The options available for help with cessation ,the behavioral changes to assist the process, and the option to either gradully reduce usage  Or abruptly stop.is also discussed. Pt  is also encouraged to set specific goals in number of cigarettes used daily, as well as to set a quit date.     Class 1 obesity with serious comorbidity and body mass index (BMI) of 33.0 to 33.9 in adult Deteriorated. Patient re-educated about  the importance of commitment to a  minimum of 150 minutes of exercise per week.  The importance of healthy food choices with portion control discussed. Encouraged to start a food diary, count calories  and to consider  joining a support group. Sample diet sheets offered. Goals set by the patient for the next several months.   Weight /BMI 04/18/2016 03/20/2016 02/10/2016  WEIGHT 168 lb 167 lb 1.6 oz 165 lb  HEIGHT 4\' 11"  4\' 11"  4\' 11"   BMI 33.93 kg/m2 33.75 kg/m2 33.33 kg/m2  Some encounter information is confidential and restricted. Go to Review Flowsheets activity to see all data.

## 2016-04-24 NOTE — Assessment & Plan Note (Signed)
Elevated at this visit , . No changes ,made DASH diet and commitment to daily physical activity for a minimum of 30 minutes discussed and encouraged, as a part of hypertension management. The importance of attaining a healthy weight is also discussed.  BP/Weight 04/18/2016 03/20/2016 02/10/2016 02/02/2016 01/20/2016 01/05/2016 [id number]  Systolic BP 0000000 [id number] [id number] 123456 0000000 [id number] [id number]  Diastolic BP 70 58 79 62 70 50 58  Wt. (Lbs) 168 167.1 165 162.6 163 165 160  BMI 33.93 33.75 33.33 32.84 32.92 33.05 30.23  Some encounter information is confidential and restricted. Go to Review Flowsheets activity to see all data.

## 2016-04-24 NOTE — Assessment & Plan Note (Signed)
Patient counseled for approximately 5 minutes regarding the health risks of ongoing nicotine use, specifically all types of cancer, heart disease, stroke and respiratory failure. The options available for help with cessation ,the behavioral changes to assist the process, and the option to either gradully reduce usage  Or abruptly stop.is also discussed. Pt is also encouraged to set specific goals in number of cigarettes used daily, as well as to set a quit date.  

## 2016-04-24 NOTE — Assessment & Plan Note (Signed)
Hyperlipidemia:Low fat diet discussed and encouraged.   Lipid Panel  Lab Results  Component Value Date   CHOL 158 04/18/2016   HDL 44 04/18/2016   LDLCALC 75 04/18/2016   TRIG 195 (H) 04/18/2016   CHOLHDL 3.1 07/19/2015  need sto reduce dietary  Fat due to high TG

## 2016-04-24 NOTE — Assessment & Plan Note (Signed)
Treated by psych and stable 

## 2016-04-24 NOTE — Assessment & Plan Note (Signed)
Deteriorated. Patient re-educated about  the importance of commitment to a  minimum of 150 minutes of exercise per week.  The importance of healthy food choices with portion control discussed. Encouraged to start a food diary, count calories and to consider  joining a support group. Sample diet sheets offered. Goals set by the patient for the next several months.   Weight /BMI 04/18/2016 03/20/2016 02/10/2016  WEIGHT 168 lb 167 lb 1.6 oz 165 lb  HEIGHT 4\' 11"  4\' 11"  4\' 11"   BMI 33.93 kg/m2 33.75 kg/m2 33.33 kg/m2  Some encounter information is confidential and restricted. Go to Review Flowsheets activity to see all data.

## 2016-04-25 DIAGNOSIS — Z9181 History of falling: Secondary | ICD-10-CM | POA: Diagnosis not present

## 2016-04-25 DIAGNOSIS — F17210 Nicotine dependence, cigarettes, uncomplicated: Secondary | ICD-10-CM | POA: Diagnosis not present

## 2016-04-25 DIAGNOSIS — J449 Chronic obstructive pulmonary disease, unspecified: Secondary | ICD-10-CM | POA: Diagnosis not present

## 2016-04-25 DIAGNOSIS — I10 Essential (primary) hypertension: Secondary | ICD-10-CM | POA: Diagnosis not present

## 2016-04-25 DIAGNOSIS — M6281 Muscle weakness (generalized): Secondary | ICD-10-CM | POA: Diagnosis not present

## 2016-04-25 DIAGNOSIS — I69398 Other sequelae of cerebral infarction: Secondary | ICD-10-CM | POA: Diagnosis not present

## 2016-04-25 DIAGNOSIS — E119 Type 2 diabetes mellitus without complications: Secondary | ICD-10-CM | POA: Diagnosis not present

## 2016-04-25 DIAGNOSIS — F250 Schizoaffective disorder, bipolar type: Secondary | ICD-10-CM | POA: Diagnosis not present

## 2016-04-25 DIAGNOSIS — M545 Low back pain: Secondary | ICD-10-CM | POA: Diagnosis not present

## 2016-04-27 ENCOUNTER — Encounter (HOSPITAL_COMMUNITY): Payer: Self-pay | Admitting: Psychiatry

## 2016-04-27 ENCOUNTER — Ambulatory Visit (INDEPENDENT_AMBULATORY_CARE_PROVIDER_SITE_OTHER): Payer: Medicare HMO | Admitting: Psychiatry

## 2016-04-27 ENCOUNTER — Other Ambulatory Visit (HOSPITAL_COMMUNITY): Payer: Self-pay | Admitting: Psychiatry

## 2016-04-27 DIAGNOSIS — F17210 Nicotine dependence, cigarettes, uncomplicated: Secondary | ICD-10-CM | POA: Diagnosis not present

## 2016-04-27 DIAGNOSIS — F331 Major depressive disorder, recurrent, moderate: Secondary | ICD-10-CM | POA: Diagnosis not present

## 2016-04-27 DIAGNOSIS — M545 Low back pain: Secondary | ICD-10-CM | POA: Diagnosis not present

## 2016-04-27 DIAGNOSIS — I69398 Other sequelae of cerebral infarction: Secondary | ICD-10-CM | POA: Diagnosis not present

## 2016-04-27 DIAGNOSIS — I10 Essential (primary) hypertension: Secondary | ICD-10-CM | POA: Diagnosis not present

## 2016-04-27 DIAGNOSIS — M6281 Muscle weakness (generalized): Secondary | ICD-10-CM | POA: Diagnosis not present

## 2016-04-27 DIAGNOSIS — E119 Type 2 diabetes mellitus without complications: Secondary | ICD-10-CM | POA: Diagnosis not present

## 2016-04-27 DIAGNOSIS — J449 Chronic obstructive pulmonary disease, unspecified: Secondary | ICD-10-CM | POA: Diagnosis not present

## 2016-04-27 DIAGNOSIS — F250 Schizoaffective disorder, bipolar type: Secondary | ICD-10-CM | POA: Diagnosis not present

## 2016-04-27 DIAGNOSIS — Z9181 History of falling: Secondary | ICD-10-CM | POA: Diagnosis not present

## 2016-04-27 NOTE — Progress Notes (Signed)
Patient:  [name]   DOB: 25-May-1950  MR Number: [id number]  Location: [facility]:  [address] [place],  [place], [address]  Start: Thursday 04/27/2016 9:20 AM  End: Thursday 04/27/2016 10:09 AM  Provider/Observer:     Maurice Small, MSW, LCSW   Chief Complaint:     Depression  Reason For Service:               [name] is a 66 y.o. female who presents with a long standing history of recurrent periods of depression beginning when she was 67 and her favorite uncle died. She has been experiencing increased symptoms of depression recently due to stress in the relationship with her son. Per patient's report, he rarely visits her anymore due to his involvement with an older woman who is controlling. She also reports frustration regarding her knees who is her power of attorney and is very demanding per patient's report. She reports issues with other family members and states she needs help dealing with her family Patient reports multiple psychiatric hospitlaizations due to depression and suicidal ideations with thel last one occuring in 1997. Patient has participated in outpatient psychotherapy and medication management intermittently since age 56.  She currently is seeing psychiatrist Dr. Harrington Challenger . Prior to this, she was being seen at [facility]. Patient also has had ECT at [facility].    Interventions Strategy:  Supportive  Participation Level:   Active      Participation Quality:  Appropriate      Behavioral Observation:  Casual, well groomed, drowsy, speech very slow   Current Psychosocial Factors: Tension in relationship with son, multiple health issues, concerns about grandson  Content of Session:   reviewed symptoms, facilitated expression of feelings, discussed interpersonal issues with family and aide, discussed ways to improve assertive communication and assisted patient identify ways to set/maintain boundaries, discussed ways to use talking to other members of  support system to help cope  Current Status:   less depressed  mood, less anxiety, continued excessive worry  Suicidal/Homicidal:    No   Patient Progress:   Fair. Patient reports having ups and downs since last session. She reports finally talking to grandson yesterday but being frustrated as he hung up on her. She continues to express frustration and worry about the relationship between her son and grandson. She reports having conflict with son yesterday regarding visiting the family of a cousin who recently died. She says son tried to pressure her to see the family but patient has unresolved issues regarding past conflict with the cousin. She also expresses frustration and mistrust regarding her aide due to recent events and interactions.  Target Goals:   1.  Learn and implement behavioral strategies to overcome depression.    2.  Verbalize an understanding and resolution of current interpersonal problems.   Last Reviewed:   03/08/2016  Goals Addressed Today:    1, 2  Plan:      Return again in 2 weeks.  Impression/Diagnosis:   Patient presents with long standing history of recurrent periods  of depression beginning when she was [age] and her favorite uncle died. Patient reports multiple psychiatric hospitlaizations due to depression and suicidal ideations with thel last one occuring in 1997. Patient has participated in outpatient psychotherapy and medication management intermittently since age 50.  She currently is seeing psychiatrist Dr. Harrington Challenger . Prior to this, she was being seen at [facility]. Patient also has had ECT at [facility]. Symptoms have  worsened in recent months due to family stress. Current symptoms include depressed mood, anxiety, excessive worry, and tearfulness.   Diagnosis:  Axis I: MDD recurrent episode, moderate          Axis II: Deferred     Mishelle Hassan, LCSW 04/27/2016

## 2016-04-28 ENCOUNTER — Telehealth: Payer: Self-pay | Admitting: Family Medicine

## 2016-04-28 ENCOUNTER — Other Ambulatory Visit: Payer: Self-pay | Admitting: Family Medicine

## 2016-04-28 DIAGNOSIS — L97501 Non-pressure chronic ulcer of other part of unspecified foot limited to breakdown of skin: Secondary | ICD-10-CM

## 2016-04-28 NOTE — Telephone Encounter (Signed)
Sent Referral to [facility] in [place] due to Insurance purposes

## 2016-04-28 NOTE — Progress Notes (Signed)
amb w

## 2016-04-28 NOTE — Telephone Encounter (Signed)
Just saw the podiatrist, but oK to refer to Dr Nils Pyle in [place] will enter pls let her know

## 2016-04-28 NOTE — Telephone Encounter (Signed)
Thank you :)

## 2016-04-28 NOTE — Telephone Encounter (Signed)
[name] is asking if we can send a referral to the wound clinic about her foot, she states that her foot is not getting any better, please advise?

## 2016-05-01 NOTE — Telephone Encounter (Signed)
You are welcome

## 2016-05-02 ENCOUNTER — Other Ambulatory Visit: Payer: Self-pay | Admitting: Internal Medicine

## 2016-05-02 ENCOUNTER — Other Ambulatory Visit: Payer: Self-pay | Admitting: *Deleted

## 2016-05-02 ENCOUNTER — Other Ambulatory Visit: Payer: Self-pay | Admitting: Family Medicine

## 2016-05-02 DIAGNOSIS — L97529 Non-pressure chronic ulcer of other part of left foot with unspecified severity: Secondary | ICD-10-CM

## 2016-05-03 ENCOUNTER — Telehealth: Payer: Self-pay

## 2016-05-03 DIAGNOSIS — I10 Essential (primary) hypertension: Secondary | ICD-10-CM | POA: Diagnosis not present

## 2016-05-03 DIAGNOSIS — F17210 Nicotine dependence, cigarettes, uncomplicated: Secondary | ICD-10-CM | POA: Diagnosis not present

## 2016-05-03 DIAGNOSIS — M545 Low back pain: Secondary | ICD-10-CM | POA: Diagnosis not present

## 2016-05-03 DIAGNOSIS — M6281 Muscle weakness (generalized): Secondary | ICD-10-CM | POA: Diagnosis not present

## 2016-05-03 DIAGNOSIS — J449 Chronic obstructive pulmonary disease, unspecified: Secondary | ICD-10-CM | POA: Diagnosis not present

## 2016-05-03 DIAGNOSIS — I69398 Other sequelae of cerebral infarction: Secondary | ICD-10-CM | POA: Diagnosis not present

## 2016-05-03 DIAGNOSIS — F250 Schizoaffective disorder, bipolar type: Secondary | ICD-10-CM | POA: Diagnosis not present

## 2016-05-03 DIAGNOSIS — M150 Primary generalized (osteo)arthritis: Secondary | ICD-10-CM | POA: Diagnosis not present

## 2016-05-03 DIAGNOSIS — I639 Cerebral infarction, unspecified: Secondary | ICD-10-CM | POA: Diagnosis not present

## 2016-05-03 DIAGNOSIS — Z9181 History of falling: Secondary | ICD-10-CM | POA: Diagnosis not present

## 2016-05-03 DIAGNOSIS — E119 Type 2 diabetes mellitus without complications: Secondary | ICD-10-CM | POA: Diagnosis not present

## 2016-05-03 DIAGNOSIS — R32 Unspecified urinary incontinence: Secondary | ICD-10-CM | POA: Diagnosis not present

## 2016-05-03 NOTE — Telephone Encounter (Signed)
Sees gI , this is a chronic complaint, I recommend she speak with GI Doc

## 2016-05-04 ENCOUNTER — Telehealth: Payer: Self-pay

## 2016-05-04 DIAGNOSIS — M5010 Cervical disc disorder with radiculopathy, unspecified cervical region: Secondary | ICD-10-CM

## 2016-05-04 NOTE — Telephone Encounter (Signed)
Pls print the script , I will sign, dx is back pain with neuropathy, high fall risk

## 2016-05-04 NOTE — Telephone Encounter (Signed)
Patient aware.

## 2016-05-05 ENCOUNTER — Encounter (HOSPITAL_BASED_OUTPATIENT_CLINIC_OR_DEPARTMENT_OTHER): Payer: Medicare HMO | Attending: Internal Medicine

## 2016-05-05 ENCOUNTER — Encounter: Payer: Self-pay | Admitting: Vascular Surgery

## 2016-05-05 DIAGNOSIS — G4730 Sleep apnea, unspecified: Secondary | ICD-10-CM | POA: Diagnosis not present

## 2016-05-05 DIAGNOSIS — E1140 Type 2 diabetes mellitus with diabetic neuropathy, unspecified: Secondary | ICD-10-CM | POA: Insufficient documentation

## 2016-05-05 DIAGNOSIS — L988 Other specified disorders of the skin and subcutaneous tissue: Secondary | ICD-10-CM | POA: Insufficient documentation

## 2016-05-05 DIAGNOSIS — Z794 Long term (current) use of insulin: Secondary | ICD-10-CM | POA: Diagnosis not present

## 2016-05-05 DIAGNOSIS — Z8631 Personal history of diabetic foot ulcer: Secondary | ICD-10-CM | POA: Diagnosis not present

## 2016-05-05 DIAGNOSIS — J449 Chronic obstructive pulmonary disease, unspecified: Secondary | ICD-10-CM | POA: Insufficient documentation

## 2016-05-05 DIAGNOSIS — F17200 Nicotine dependence, unspecified, uncomplicated: Secondary | ICD-10-CM | POA: Diagnosis not present

## 2016-05-05 DIAGNOSIS — I69354 Hemiplegia and hemiparesis following cerebral infarction affecting left non-dominant side: Secondary | ICD-10-CM | POA: Insufficient documentation

## 2016-05-05 DIAGNOSIS — I10 Essential (primary) hypertension: Secondary | ICD-10-CM | POA: Diagnosis not present

## 2016-05-05 DIAGNOSIS — L97529 Non-pressure chronic ulcer of other part of left foot with unspecified severity: Secondary | ICD-10-CM | POA: Diagnosis not present

## 2016-05-05 NOTE — Addendum Note (Signed)
Addended by: Denman George B on: 05/05/2016 12:14 PM   Modules accepted: Orders

## 2016-05-05 NOTE — Telephone Encounter (Signed)
Order entered.  Will send to supplier of patient's choice

## 2016-05-08 DIAGNOSIS — E119 Type 2 diabetes mellitus without complications: Secondary | ICD-10-CM | POA: Diagnosis not present

## 2016-05-08 DIAGNOSIS — J449 Chronic obstructive pulmonary disease, unspecified: Secondary | ICD-10-CM | POA: Diagnosis not present

## 2016-05-08 DIAGNOSIS — F250 Schizoaffective disorder, bipolar type: Secondary | ICD-10-CM | POA: Diagnosis not present

## 2016-05-08 DIAGNOSIS — M79609 Pain in unspecified limb: Secondary | ICD-10-CM | POA: Diagnosis not present

## 2016-05-08 DIAGNOSIS — I69398 Other sequelae of cerebral infarction: Secondary | ICD-10-CM | POA: Diagnosis not present

## 2016-05-08 DIAGNOSIS — M6281 Muscle weakness (generalized): Secondary | ICD-10-CM | POA: Diagnosis not present

## 2016-05-08 DIAGNOSIS — I10 Essential (primary) hypertension: Secondary | ICD-10-CM | POA: Diagnosis not present

## 2016-05-08 DIAGNOSIS — Z9181 History of falling: Secondary | ICD-10-CM | POA: Diagnosis not present

## 2016-05-08 DIAGNOSIS — M545 Low back pain: Secondary | ICD-10-CM | POA: Diagnosis not present

## 2016-05-08 DIAGNOSIS — F17210 Nicotine dependence, cigarettes, uncomplicated: Secondary | ICD-10-CM | POA: Diagnosis not present

## 2016-05-09 ENCOUNTER — Ambulatory Visit (HOSPITAL_COMMUNITY)
Admission: RE | Admit: 2016-05-09 | Discharge: 2016-05-09 | Disposition: A | Discharge status: Home / Self Care | Payer: Medicare HMO | Source: Ambulatory Visit | Attending: Internal Medicine | Admitting: Internal Medicine

## 2016-05-09 ENCOUNTER — Other Ambulatory Visit (HOSPITAL_COMMUNITY): Payer: Self-pay | Admitting: *Deleted

## 2016-05-09 DIAGNOSIS — M7989 Other specified soft tissue disorders: Secondary | ICD-10-CM | POA: Insufficient documentation

## 2016-05-09 DIAGNOSIS — I998 Other disorder of circulatory system: Secondary | ICD-10-CM | POA: Diagnosis not present

## 2016-05-09 DIAGNOSIS — M79672 Pain in left foot: Secondary | ICD-10-CM

## 2016-05-10 DIAGNOSIS — E662 Morbid (severe) obesity with alveolar hypoventilation: Secondary | ICD-10-CM | POA: Diagnosis not present

## 2016-05-11 ENCOUNTER — Encounter (HOSPITAL_BASED_OUTPATIENT_CLINIC_OR_DEPARTMENT_OTHER): Payer: Medicare HMO | Attending: Internal Medicine

## 2016-05-11 ENCOUNTER — Ambulatory Visit (HOSPITAL_COMMUNITY)
Admission: RE | Admit: 2016-05-11 | Discharge: 2016-05-11 | Disposition: A | Discharge status: Home / Self Care | Payer: Medicare HMO | Source: Ambulatory Visit | Attending: Vascular Surgery | Admitting: Vascular Surgery

## 2016-05-11 DIAGNOSIS — F17200 Nicotine dependence, unspecified, uncomplicated: Secondary | ICD-10-CM | POA: Diagnosis not present

## 2016-05-11 DIAGNOSIS — L97529 Non-pressure chronic ulcer of other part of left foot with unspecified severity: Secondary | ICD-10-CM | POA: Diagnosis not present

## 2016-05-11 DIAGNOSIS — Z09 Encounter for follow-up examination after completed treatment for conditions other than malignant neoplasm: Secondary | ICD-10-CM | POA: Diagnosis not present

## 2016-05-11 DIAGNOSIS — Z8631 Personal history of diabetic foot ulcer: Secondary | ICD-10-CM | POA: Diagnosis not present

## 2016-05-11 DIAGNOSIS — E785 Hyperlipidemia, unspecified: Secondary | ICD-10-CM | POA: Insufficient documentation

## 2016-05-11 DIAGNOSIS — E11621 Type 2 diabetes mellitus with foot ulcer: Secondary | ICD-10-CM | POA: Insufficient documentation

## 2016-05-11 DIAGNOSIS — E119 Type 2 diabetes mellitus without complications: Secondary | ICD-10-CM | POA: Insufficient documentation

## 2016-05-11 DIAGNOSIS — I10 Essential (primary) hypertension: Secondary | ICD-10-CM | POA: Insufficient documentation

## 2016-05-12 ENCOUNTER — Other Ambulatory Visit: Payer: Self-pay | Admitting: *Deleted

## 2016-05-12 ENCOUNTER — Encounter: Payer: Self-pay | Admitting: Vascular Surgery

## 2016-05-12 ENCOUNTER — Ambulatory Visit (INDEPENDENT_AMBULATORY_CARE_PROVIDER_SITE_OTHER): Payer: Medicare HMO | Admitting: Vascular Surgery

## 2016-05-12 VITALS — BP 133/65 | HR 59 | Temp 98.9°F | Resp 18 | Ht 59.0 in | Wt 162.8 lb

## 2016-05-12 DIAGNOSIS — L97522 Non-pressure chronic ulcer of other part of left foot with fat layer exposed: Secondary | ICD-10-CM | POA: Diagnosis not present

## 2016-05-12 NOTE — Progress Notes (Signed)
Referred by:  [name] Helper, MD [address], [place], [place] [address]   Reason for referral: poor healing L 2nd toe    History of Present Illness  [name] is a 66 y.o. (04/02/1950) female who presents with cc: not healing toe.  Pt notes onset of wound to L 2nd toe in Jan 2018.  She is not certain exact mechanism of injury.  She has subsequently undergone wound care with her podiatry without resolution of her wound.  She notes pain, mod-severe, with manipulating the L 2nd toe.  She denies any drain currently.  She denies fever or chills.  She feels like her mid-foot is becoming swollen.  Her atherosclerotic risks include: DM, HLD, HTN and smoking.  Pt notes her walking is limited even before developing her L foot wound.  She denies any intermittent claudication or rest pain.  Past Medical History:  Diagnosis Date  . Anemia   . Anxiety    takes Ativan daily  . Arthritis   . Bipolar disorder ([place])    takes Risperdal nightly  . Blood transfusion   . Cancer ([place])    In her gum  . Carpal tunnel syndrome of right wrist 05/23/2011  . Cervical disc disorder with radiculopathy of cervical region 10/31/2012  . Chronic back pain   . Chronic idiopathic constipation   . Colon polyps   . COPD (chronic obstructive pulmonary disease) with chronic bronchitis ([place]) 09/16/2013   Office Spirometry 10/30/2013-submaximal effort based on appearance of loop and curve. Numbers would fit with severe restriction but her physiologic capability may be better than this. FVC 0.91/44%, and 10.74/45%, FEV1/FVC 0.81, FEF 25-75% 1.43/69%    . Depression    takes Zoloft daily  . Diabetes mellitus    Type II  . Diverticulosis    TCS 9/08 by Dr. Delfin Edis for diarrhea . Bx for micro scopic colitis negative.   . Fibromyalgia   . Frequent falls   . GERD (gastroesophageal reflux disease)    takes Aciphex daily  . Glaucoma    eye drops daily  . Gum symptoms    infection on antibiotic  .  Hemiplegia affecting non-dominant side, post-stroke ([place]) 08/02/2011  . Hyperlipidemia    takes Crestor daily  . Hypertension    takes Amlodipine,Metoprolol,and Clonidine daily  . Hypothyroidism    takes Synthroid daily  . IBS (irritable bowel syndrome)   . Insomnia    takes Trazodone nightly  . Metabolic encephalopathy 0/16/0109  . Migraines    chronic headaches  . Mononeuritis lower limb   . Osteoporosis   . Pancreatitis 2006   due to Depakote with normal EUS   . Schatzki's ring    non critical / EGD with ED 8/2011with RMR  . Seizures ([place])    takes Lamictal daily.Last seizure 3 yrs ago  . Sleep apnea    on CPAP  . Stroke [facility])    left sided weakness  . Tubular adenoma of colon     Past Surgical History:  Procedure Laterality Date  . ABDOMINAL HYSTERECTOMY  1978  . BACK SURGERY  July 2012  . BACTERIAL OVERGROWTH TEST N/A 05/05/2013   Procedure: BACTERIAL OVERGROWTH TEST;  Surgeon: Daneil Dolin, MD;  [facility];  Service: Endoscopy;  Laterality: N/A;  7:30  . BIOPSY THYROID  2009  . BRAIN SURGERY  11/2011   resection of meningioma  . BREAST REDUCTION SURGERY  1994  . CARDIAC CATHETERIZATION  05/10/2005   normal coronaries, normal LV systolic function and EF (Dr. Jackie Plum)  . CARPAL TUNNEL RELEASE Left 07/22/04   Dr. Aline Brochure  . CATARACT EXTRACTION Bilateral   . CHOLECYSTECTOMY  1984  . COLONOSCOPY N/A 09/25/2012   ZDG:LOVFIEP diverticulosis.  colonic polyp-removed : tubular adenoma  . CRANIOTOMY  11/23/2011   Procedure: CRANIOTOMY TUMOR EXCISION;  Surgeon: Hosie Spangle, MD;  Location: [name];  Service: Neurosurgery;  Laterality: N/A;  Craniotomy for tumor resection  . ESOPHAGOGASTRODUODENOSCOPY  12/29/2010   [place]k-Retained food in the esophagus and stomach, small hiatal hernia, status post Maloney dilation of the esophagus  . ESOPHAGOGASTRODUODENOSCOPY N/A 09/25/2012   PIR:JJOACZYS atonic baggy esophagus status post Maloney dilation 42 F. Hiatal  hernia  . GIVENS CAPSULE STUDY N/A 01/15/2013   NORMAL.   . IR GENERIC HISTORICAL  03/17/2016   IR RADIOLOGIST EVAL & MGMT 03/17/2016 [facility]INTER[facility] RAD  . LESION REMOVAL N/A 05/31/2015   Procedure: REMOVAL RIGHT AND LEFT LESIONS OF MANDIBLE;  Surgeon: Diona Browner, DDS;  Location: [facility];  Service: Oral Surgery;  Laterality: N/A;  . MALONEY DILATION  12/29/2010   RMR;  . NM MYOCAR PERF WALL MOTION  2006   "relavtiely normal" persantine, mild anterior thinning (breast attenuation artifact), no region of scar/ischemia  . OVARIAN CYST REMOVAL    . RECTOCELE REPAIR N/A 06/29/2015   Procedure: POSTERIOR REPAIR (RECTOCELE);  Surgeon: Jonnie Kind, MD;  Location: [facility];  Service: Gynecology;  Laterality: N/A;  . SPINE SURGERY  09/29/2010   Dr. Rolena Infante  . surgical excision of 3 tumors from right thigh and right buttock  and left upper thigh  2010  . TOOTH EXTRACTION Bilateral 12/14/2014   Procedure: REMOVAL OF BILATERAL MANDIBULAR EXOSTOSES;  Surgeon: Diona Browner, DDS;  Location: [facility];  Service: Oral Surgery;  Laterality: Bilateral;  . TRANSTHORACIC ECHOCARDIOGRAM  2010   EF 60-65%, mild conc LVH, grade 1 diastolic dysfunction; mildly calcified MV annulus with mildly thickened leaflets, mildly calcified MR annulus    Social History   Social History  . Marital status: Divorced    Spouse name: N/A  . Number of children: 1  . Years of education: 25   Occupational History  . disabled     Social History Main Topics  . Smoking status: Light Tobacco Smoker    Packs/day: 0.25    Years: 7.00    Types: Cigarettes  . Smokeless tobacco: Never Used     Comment: continues to smoke 1/4 pack a day   . Alcohol use No     Comment:    . Drug use: No  . Sexual activity: No   Other Topics Concern  . Not on file   Social History Narrative  . No narrative on file    Family History  Problem Relation Age of Onset  . Heart attack Mother     HTN  . Pneumonia Father   . Kidney failure Father   .  Diabetes Father   . Pancreatic cancer Sister   . Diabetes Brother   . Hypertension Brother   . Diabetes Brother   . Cancer Sister     breast   . Hypertension Son   . Sleep apnea Son   . Cancer Sister     pancreatic  . Stroke Maternal Grandmother   . Heart attack Maternal Grandfather   . Alcohol abuse Maternal Uncle   . Colon cancer Neg Hx   . Anesthesia problems Neg Hx   .  Hypotension Neg Hx   . Malignant hyperthermia Neg Hx   . Pseudochol deficiency Neg Hx     Current Outpatient Prescriptions  Medication Sig Dispense Refill  . ACCU-CHEK FASTCLIX LANCETS MISC Use to test blood sugar 4 times daily. Dx: E10.65 408 each 1  . ACCU-CHEK SMARTVIEW test strip TEST BLOOD SUGAR FOUR TIMES DAILY AS DIRECTED 350 each 2  . albuterol (PROVENTIL HFA;VENTOLIN HFA) 108 (90 BASE) MCG/ACT inhaler Inhale 1 puff into the lungs 3 (three) times daily. 1 Inhaler 2  . Alcohol Swabs (B-D SINGLE USE SWABS REGULAR) PADS Use for injections and glucose testing 8 times daily. Dx: E10.65 900 each 1  . amLODipine (NORVASC) 5 MG tablet TAKE 2 TABLETS BY MOUTH ONCE DAILY. 60 tablet 0  . aspirin EC 81 MG tablet Take 81 mg by mouth daily.    . Azelastine-Fluticasone (DYMISTA) 137-50 MCG/ACT SUSP Place 1 puff into the nose at bedtime. 1 Bottle 0  . BD PEN NEEDLE NANO U/F 32G X 4 MM MISC USE FOUR TIMES DAILY 360 each 3  . Blood Glucose Calibration (ACCU-CHEK SMARTVIEW CONTROL) LIQD 1 each by Other route as needed. 1 each 2  . Blood Glucose Monitoring Suppl (ACCU-CHEK NANO SMARTVIEW) W/DEVICE KIT 1 each by Does not apply route daily. Dx: E10.65 1 kit 0  . Cholecalciferol (VITAMIN D) 2000 units CAPS Take 1 capsule by mouth daily.    . clobetasol cream (TEMOVATE) 3.50 % Apply 1 application topically daily.    . cloNIDine (CATAPRES) 0.3 MG tablet TAKE 1 TABLET BY MOUTH EVERY EIGHT HOURS. ONCE AT 8AM, 4PM, AND 12 MIDNIGHT. 270 tablet 1  . diclofenac sodium (VOLTAREN) 1 % GEL Apply 2 g topically daily as needed (Pain). 100  g 2  . dicyclomine (BENTYL) 10 MG capsule TAKE 1 CAPSULE BY MOUTH THREE TIMES DAILY BEFORE MEALS. 90 capsule 3  . hydroxychloroquine (PLAQUENIL) 200 MG tablet Take 200 mg by mouth daily.    . Insulin Glargine (TOUJEO SOLOSTAR) 300 UNIT/ML SOPN Inject 25 Units into the skin at bedtime. 6 pen 3  . lamoTRIgine (LAMICTAL) 100 MG tablet TAKE 1 TABLET BY MOUTH TWICE DAILY. 60 tablet 6  . levothyroxine (SYNTHROID, LEVOTHROID) 50 MCG tablet TAKE 1 TABLET BY MOUTH ONCE DAILY AND 1/2 TABLET ON SUNDAYS. 30 tablet 0  . LORazepam (ATIVAN) 0.5 MG tablet Take 1 tablet (0.5 mg total) by mouth 3 (three) times daily. 90 tablet 2  . losartan (COZAAR) 50 MG tablet TAKE ONE TABLET BY MOUTH DAILY. 90 tablet 1  . methocarbamol (ROBAXIN) 500 MG tablet Take 500 mg by mouth daily.    . metoprolol (LOPRESSOR) 50 MG tablet TAKE 1 TABLET BY MOUTH TWICE DAILY. 180 tablet 3  . montelukast (SINGULAIR) 10 MG tablet TAKE ONE TABLET BY MOUTH ONCE DAILY. 90 tablet 1  . Multiple Vitamins-Minerals (ONE-A-DAY 50 PLUS PO) Take 1 tablet by mouth daily.    [name] NOVOLOG FLEXPEN 100 UNIT/ML FlexPen INJECT 16 TO 30 UNITS INTO THE SKIN 3 (THREE) TIMES DAILY WITH MEALS. 90 mL 2  . nystatin (MYCOSTATIN/NYSTOP) powder APPLY TO AFFECTED AREA FOUR TIMES DAILY. 30 g 2  . ondansetron (ZOFRAN) 4 MG tablet Take 1 tablet (4 mg total) by mouth every 8 (eight) hours as needed for nausea or vomiting. 20 tablet 0  . Plecanatide (TRULANCE) 3 MG TABS Take 3 mg by mouth daily. 30 tablet 2  . pregabalin (LYRICA) 75 MG capsule Take 75 mg by mouth 2 (two) times daily.     [name]  RABEprazole (ACIPHEX) 20 MG tablet TAKE 1 TABLET BY MOUTH TWICE DAILY. 60 tablet 3  . RESTASIS 0.05 % ophthalmic emulsion Place 1 drop into both eyes 2 (two) times daily.     . risperiDONE (RISPERDAL) 0.5 MG tablet Take 1 tablet (0.5 mg total) by mouth at bedtime. 30 tablet 3  . rosuvastatin (CRESTOR) 5 MG tablet TAKE 1 TABLET BY MOUTH AT BEDTIME. 90 tablet 1  . sertraline (ZOLOFT) 100 MG  tablet Take 2 tablets (200 mg total) by mouth daily. 60 tablet 3  . traZODone (DESYREL) 150 MG tablet Take 2 tablets (300 mg total) by mouth at bedtime. 60 tablet 3  . vitamin B-12 (CYANOCOBALAMIN) 1000 MCG tablet Take 1,000 mcg by mouth daily.    . vitamin E 400 UNIT capsule Take 400 Units by mouth daily.     Current Facility-Administered Medications  Medication Dose Route Frequency Provider Last Rate Last Dose  . 0.9 %  sodium chloride infusion  500 mL Intravenous Continuous Jerene Bears, MD        Allergies  Allergen Reactions  . Cephalexin Hives  . Iron Nausea And Vomiting  . Milk-Related Compounds Other (See Comments)    Doesn't agree with stomach.   . Penicillins Hives    Has patient had a PCN reaction causing immediate rash, facial/tongue/throat swelling, SOB or lightheadedness with hypotension: Yes Has patient had a PCN reaction causing severe rash involving mucus membranes or skin necrosis: No Has patient had a PCN reaction that required hospitalization No Has patient had a PCN reaction occurring within the last 10 years: No If all of the above answers are "NO", then may proceed with Cephalosporin use.   [name] Phenazopyridine Hcl Hives           REVIEW OF SYSTEMS:   Cardiac:  positive for: Shortness of breath upon exertion, negative for: Chest pain or chest pressure and Shortness of breath when lying flat,   Vascular:  positive for: no symptoms,  negative for: Pain in calf, thigh, or hip brought on by ambulation, Pain in feet at night that wakes you up from your sleep, Blood clot in your veins and Leg swelling  Pulmonary:  positive for: no symptoms,  negative for: Oxygen at home, Productive cough and Wheezing  Neurologic:  positive for: sudden weakness and numbness in arms and legs, negative for: Sudden onset of difficulty speaking or slurred speech, Temporary loss of vision in one eye and Problems with dizziness  Gastrointestinal:  positive for: no  symptoms, negative for: Blood in stool and Vomited blood  Genitourinary:  positive for: no symptoms, negative for: Burning when urinating and Blood in urine  Psychiatric:  positive for: bipolar and depression,  negative for: schizophrenia  Hematologic:  positive for: no symptoms,  negative for: negative for: Bleeding problems and Problems with blood clotting too easily  Dermatologic:  positive for: Rashes or ulcers, negative for: abnormal skin lesions  Constitutional:  positive for: no symptoms, negative for: Fever or chills  Ear/Nose/Throat:  positive for: no symptoms, negative for: Change in hearing, Nose bleeds and Sore throat  Musculoskeletal:  positive for: no symptoms, negative for: Back pain, Joint pain and Muscle pain   Physical Examination  Vitals:   05/12/16 1526  BP: 133/65  Pulse: (!) 59  Resp: 18  Temp: 98.9 F (37.2 C)  SpO2: 98%  Weight: 162 lb 12.8 oz (73.8 kg)  Height: 4' 11"  (1.499 m)    Body mass index is 32.88 kg/m.  General: Alert, O x 3, Obese, NAD  Head: [place]/AT,    Ear/Nose/Throat: Hearing grossly intact, nares without erythema or drainage, oropharynx with  Erythema without Exudate, Mallampati score: 3,   Eyes: PERRLA, EOMI,    Neck: Supple, mid-line trachea,    Pulmonary: Sym exp, good B air movt, rales on B up to mid-segment  Cardiac: RRR, Nl S1, S2, no Murmurs, No rubs, No S3,S4  Vascular: Vessel Right Left  Radial Palpable Palpable  Brachial Palpable Palpable  Carotid Palpable Palpable  Aorta Not palpable due to pannus N/A  Femoral Palpable Palpable  Popliteal Not palpable Not palpable  PT Palpable Palpable  DP Palpable Palpable   Gastrointestinal: soft, non-distended, non-tender to palpation, No guarding or rebound, no HSM, no masses, no CVAT B, large pannus,    Musculoskeletal: grossly intact motor in all extremities, Extremities without ischemic changes except L 2nd toe appears ischemic with clean ulcer at distal  phalange, Left fore foot swollen and tender to palpation, No obvious varicosities , No Lipodermatosclerosis present  Neurologic: Cranial nerves 2-12 intact , Pain and light touch intact in extremities except for decreased sensation in L>R, Motor exam as listed above  Psychiatric: Judgement intact, Mood & affect appropriate for pt's clinical situation  Dermatologic: See M/S exam for extremity exam, No rashes otherwise noted  Lymph : Palpable lymph nodes: None   Non-Invasive Vascular Imaging  ABI (Date: 05/12/2016)  R:   ABI: 1.24,   DP: tri  PT: bi  TBI: 0.88  L:   ABI: 1.14,   DP: tri  PT: bi  TBI: 0.87  Radiology: L foot complete (05/09/16): There is no acute bony abnormality of the left foot. There is no significant degenerative joint change. Mild soft tissue swelling over the dorsum of the forefoot may reflect edema or cellulitis. Correlation with clinical and laboratory values will be needed. If the patient's symptoms persist, MRI may be the most useful next imaging step.    Outside Studies/Documentation 10 pages of outside documents were reviewed including: outpatient podiatry records.   Medical Decision Making  Azucena Dart Sch[facility] is a 66 y.o. female who presents with: possible Left 2nd toe osteomyelitis with associated metatarsal infection, multiple active co-morbidities, no evidence of peripheral arterial disease    While the patient does not have PAD currently based on ABI, her L 2nd toe appears ischemic.   In a patient with DM, I would have a high index of suspicion for osteomyelitis especially findings on L foot X-ray.  Fortunately, she should have adequate blood flow to heal any interventions on that L foot.  Based on the patient's vascular studies and examination, I have offered the patient: L foot MRI, referral to Dr. Sharol Given for further managment.  I discussed in depth with the patient the nature of atherosclerosis, and emphasized the importance of  maximal medical management including strict control of blood pressure, blood glucose, and lipid levels, obtaining regular exercise, antiplatelet agents, and cessation of smoking.   The patient is currently on a statin: Crestor.  The patient is currently on an anti-platelet: ASA.  The patient is aware that without maximal medical management the underlying atherosclerotic disease process will progress, limiting the benefit of any interventions.  Thank you for allowing [name] to participate in this patient's care.   Adele Barthel, MD, FACS [facility] Office: [phone] Pager: [phone]  05/12/2016, 3:53 PM

## 2016-05-14 ENCOUNTER — Emergency Department (HOSPITAL_COMMUNITY)
Admission: EM | Admit: 2016-05-14 | Discharge: 2016-05-14 | Disposition: A | Discharge status: Home / Self Care | Payer: Medicare HMO | Attending: Emergency Medicine | Admitting: Emergency Medicine

## 2016-05-14 ENCOUNTER — Emergency Department (HOSPITAL_COMMUNITY): Payer: Medicare HMO

## 2016-05-14 ENCOUNTER — Encounter (HOSPITAL_COMMUNITY): Payer: Self-pay | Admitting: *Deleted

## 2016-05-14 DIAGNOSIS — E039 Hypothyroidism, unspecified: Secondary | ICD-10-CM | POA: Diagnosis not present

## 2016-05-14 DIAGNOSIS — Z79899 Other long term (current) drug therapy: Secondary | ICD-10-CM | POA: Insufficient documentation

## 2016-05-14 DIAGNOSIS — J449 Chronic obstructive pulmonary disease, unspecified: Secondary | ICD-10-CM | POA: Diagnosis not present

## 2016-05-14 DIAGNOSIS — I10 Essential (primary) hypertension: Secondary | ICD-10-CM | POA: Diagnosis not present

## 2016-05-14 DIAGNOSIS — R109 Unspecified abdominal pain: Secondary | ICD-10-CM | POA: Diagnosis not present

## 2016-05-14 DIAGNOSIS — F17210 Nicotine dependence, cigarettes, uncomplicated: Secondary | ICD-10-CM | POA: Insufficient documentation

## 2016-05-14 DIAGNOSIS — Z7982 Long term (current) use of aspirin: Secondary | ICD-10-CM | POA: Insufficient documentation

## 2016-05-14 DIAGNOSIS — R1031 Right lower quadrant pain: Secondary | ICD-10-CM | POA: Insufficient documentation

## 2016-05-14 DIAGNOSIS — Z794 Long term (current) use of insulin: Secondary | ICD-10-CM | POA: Diagnosis not present

## 2016-05-14 DIAGNOSIS — E119 Type 2 diabetes mellitus without complications: Secondary | ICD-10-CM | POA: Diagnosis not present

## 2016-05-14 DIAGNOSIS — M5489 Other dorsalgia: Secondary | ICD-10-CM | POA: Diagnosis not present

## 2016-05-14 LAB — CBC WITH DIFFERENTIAL/PLATELET
Basophils Absolute: 0 10*3/uL (ref 0.0–0.1)
Basophils Relative: 0 %
Eosinophils Absolute: 0.1 10*3/uL (ref 0.0–0.7)
Eosinophils Relative: 2 %
HEMATOCRIT: 34.6 % — AB (ref 36.0–46.0)
HEMOGLOBIN: 10.7 g/dL — AB (ref 12.0–15.0)
LYMPHS ABS: 1.7 10*3/uL (ref 0.7–4.0)
LYMPHS PCT: 26 %
MCH: 23 pg — ABNORMAL LOW (ref 26.0–34.0)
MCHC: 30.9 g/dL (ref 30.0–36.0)
MCV: 74.2 fL — AB (ref 78.0–100.0)
MONO ABS: 0.6 10*3/uL (ref 0.1–1.0)
MONOS PCT: 8 %
NEUTROS ABS: 4.3 10*3/uL (ref 1.7–7.7)
Neutrophils Relative %: 64 %
Platelets: 190 10*3/uL (ref 150–400)
RBC: 4.66 MIL/uL (ref 3.87–5.11)
RDW: 14.6 % (ref 11.5–15.5)
WBC: 6.6 10*3/uL (ref 4.0–10.5)

## 2016-05-14 LAB — COMPREHENSIVE METABOLIC PANEL
ALBUMIN: 3.7 g/dL (ref 3.5–5.0)
ALT: 25 U/L (ref 14–54)
ANION GAP: 7 (ref 5–15)
AST: 29 U/L (ref 15–41)
Alkaline Phosphatase: 97 U/L (ref 38–126)
BUN: 12 mg/dL (ref 6–20)
CHLORIDE: 109 mmol/L (ref 101–111)
CO2: 25 mmol/L (ref 22–32)
Calcium: 9.3 mg/dL (ref 8.9–10.3)
Creatinine, Ser: 0.67 mg/dL (ref 0.44–1.00)
GFR calc non Af Amer: >60 mL/min (ref >60)
GLUCOSE: 83 mg/dL (ref 65–99)
POTASSIUM: 3.1 mmol/L — AB (ref 3.5–5.1)
SODIUM: 141 mmol/L (ref 135–145)
Total Bilirubin: 0.3 mg/dL (ref 0.3–1.2)
Total Protein: 7.1 g/dL (ref 6.5–8.1)

## 2016-05-14 LAB — URINALYSIS, ROUTINE W REFLEX MICROSCOPIC
Bilirubin Urine: NEGATIVE
Glucose, UA: NEGATIVE mg/dL
Hgb urine dipstick: NEGATIVE
KETONES UR: NEGATIVE mg/dL
LEUKOCYTES UA: NEGATIVE
Nitrite: NEGATIVE
PH: 6 (ref 5.0–8.0)
Protein, ur: NEGATIVE mg/dL
SPECIFIC GRAVITY, URINE: 1.017 (ref 1.005–1.030)

## 2016-05-14 LAB — LIPASE, BLOOD: Lipase: 12 U/L (ref 11–51)

## 2016-05-14 MED ORDER — IOPAMIDOL (ISOVUE-300) INJECTION 61%
100.0000 mL | Freq: Once | INTRAVENOUS | Status: AC | PRN
  Administered 2016-05-14: 100 mL via INTRAVENOUS

## 2016-05-14 MED ORDER — ONDANSETRON HCL 4 MG/2ML IJ SOLN
INTRAMUSCULAR | Status: AC
  Filled 2016-05-14: qty 2

## 2016-05-14 MED ORDER — IOPAMIDOL (ISOVUE-300) INJECTION 61%
INTRAVENOUS | Status: AC
  Administered 2016-05-14: 30 mL via ORAL
  Filled 2016-05-14: qty 30

## 2016-05-14 MED ORDER — ONDANSETRON HCL 4 MG/2ML IJ SOLN
4.0000 mg | Freq: Once | INTRAMUSCULAR | Status: AC
  Administered 2016-05-14: 4 mg via INTRAVENOUS

## 2016-05-14 NOTE — ED Provider Notes (Signed)
[facility] DEPT Provider Note   CSN: [id number] Arrival date & time: 05/14/16  1416     History   Chief Complaint Chief Complaint  Patient presents with  . Wound Check    HPI [name] is a 66 y.o. female.  Patient complains of right flank pain right lower quadrant pain   The history is provided by the patient. No language interpreter was used.  Flank Pain  This is a new problem. The current episode started 2 days ago. The problem occurs constantly. The problem has been gradually improving. Pertinent negatives include no chest pain, no abdominal pain and no headaches. Nothing aggravates the symptoms. Nothing relieves the symptoms. She has tried nothing for the symptoms.    Past Medical History:  Diagnosis Date  . Anemia   . Anxiety    takes Ativan daily  . Arthritis   . Bipolar disorder ([place])    takes Risperdal nightly  . Blood transfusion   . Cancer ([place])    In her gum  . Carpal tunnel syndrome of right wrist 05/23/2011  . Cervical disc disorder with radiculopathy of cervical region 10/31/2012  . Chronic back pain   . Chronic idiopathic constipation   . Colon polyps   . COPD (chronic obstructive pulmonary disease) with chronic bronchitis ([place]) 09/16/2013   Office Spirometry 10/30/2013-submaximal effort based on appearance of loop and curve. Numbers would fit with severe restriction but her physiologic capability may be better than this. FVC 0.91/44%, and 10.74/45%, FEV1/FVC 0.81, FEF 25-75% 1.43/69%    . Depression    takes Zoloft daily  . Diabetes mellitus    Type II  . Diverticulosis    TCS 9/08 by Dr. Delfin Edis for diarrhea . Bx for micro scopic colitis negative.   . Fibromyalgia   . Frequent falls   . GERD (gastroesophageal reflux disease)    takes Aciphex daily  . Glaucoma    eye drops daily  . Gum symptoms    infection on antibiotic  . Hemiplegia affecting non-dominant side, post-stroke ([place]) 08/02/2011  . Hyperlipidemia    takes Crestor daily  .  Hypertension    takes Amlodipine,Metoprolol,and Clonidine daily  . Hypothyroidism    takes Synthroid daily  . IBS (irritable bowel syndrome)   . Insomnia    takes Trazodone nightly  . Metabolic encephalopathy 06/19/8117  . Migraines    chronic headaches  . Mononeuritis lower limb   . Osteoporosis   . Pancreatitis 2006   due to Depakote with normal EUS   . Schatzki's ring    non critical / EGD with ED 8/2011with RMR  . Seizures ([place])    takes Lamictal daily.Last seizure 3 yrs ago  . Sleep apnea    on CPAP  . Stroke [name])    left sided weakness  . Tubular adenoma of colon     Patient Active Problem List   Diagnosis Date Noted  . Cigarette smoker 04/24/2016  . Class 1 obesity with serious comorbidity and body mass index (BMI) of 33.0 to 33.9 in adult 04/24/2016  . Toe ulcer, left, with fat layer exposed ([place]) 04/18/2016  . History of palpitations 08/09/2015  . Nausea without vomiting 08/09/2015  . Labile hypertension 08/03/2015  . Normal coronary arteries 08/03/2015  . Left-sided low back pain with left-sided sciatica 06/27/2015  . Left flank pain 06/27/2015  . Acute cystitis without hematuria 05/10/2015  . Poorly controlled type 2 diabetes mellitus with circulatory disorder ([place]) 05/06/2015  . Multinodular goiter  05/06/2015  . Rectocele, female 04/27/2015  . Anal sphincter incontinence 04/27/2015  . Pelvic relaxation due to rectocele 03/30/2015  . Pulmonary hypertension 02/22/2015  . Weakness of left upper extremity 02/21/2015  . Light cigarette smoker (1-9 cigarettes per day) 01/11/2015  . Migraine without aura and without status migrainosus, not intractable 07/02/2014  . Flatulence 02/18/2014  . Microcytic anemia 02/18/2014  . COPD (chronic obstructive pulmonary disease) with chronic bronchitis ([facility]) 09/16/2013  . Hypothyroidism 08/16/2013  . Gastroparesis 04/28/2013  . Seizure disorder ([facility]) 01/19/2013  . Cervical disc disorder with radiculopathy of cervical region  10/31/2012  . Solitary pulmonary nodule 08/19/2012  . Anemia 07/05/2012  . 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 Allergic sinusitis 04/18/2012  . Meningioma ([facility]) 11/19/2011  . 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 Hemiplegia affecting non-dominant side, post-stroke ([facility]) 08/02/2011  . Carpal tunnel syndrome of right wrist 05/23/2011  . Polypharmacy 04/28/2011  . Bipolar disorder ([facility]) 04/28/2011  . Constipation 04/13/2011  . Falls frequently 12/12/2010  . Urinary incontinence 12/16/2009  . HEARING LOSS 10/26/2009  . Hyperlipidemia 12/11/2008  . IBS 12/11/2008  . GERD 07/29/2008  . MILK PRODUCTS ALLERGY 07/29/2008  . 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 Backache 06/19/2007  . Osteoporosis 06/19/2007  . Obstructive sleep apnea 06/19/2007  . TRIGGER FINGER 04/18/2007  . DIVERTICULOSIS, COLON 11/13/2006    Past Surgical History:  Procedure Laterality Date  . ABDOMINAL HYSTERECTOMY  1978  . BACK SURGERY  July 2012  . BACTERIAL OVERGROWTH TEST N/A 05/05/2013   Procedure: BACTERIAL OVERGROWTH TEST;  Surgeon: Corbin Ade, MD;  Location: [facility] ENDO SUITE;  Service: Endoscopy;  Laterality: N/A;  7:30  . BIOPSY THYROID  2009  . BRAIN SURGERY  11/2011   resection of meningioma  . BREAST REDUCTION SURGERY  1994  . CARDIAC CATHETERIZATION  05/10/2005   normal coronaries, normal LV systolic function and EF (Dr. Evlyn Courier)  . CARPAL TUNNEL RELEASE Left 07/22/04   Dr. Romeo Apple  . CATARACT EXTRACTION Bilateral   . CHOLECYSTECTOMY  1984  . COLONOSCOPY N/A 09/25/2012   QLQ:[id number] diverticulosis.  colonic polyp-removed : tubular adenoma  . CRANIOTOMY  11/23/2011   Procedure: CRANIOTOMY TUMOR EXCISION;  Surgeon: Hewitt Shorts, MD;  Location: [facility] NEURO ORS;  Service: Neurosurgery;  Laterality: N/A;  Craniotomy for tumor resection  . ESOPHAGOGASTRODUODENOSCOPY  12/29/2010   R[place]k-Retained food in the esophagus and stomach, small  hiatal hernia, status post Maloney dilation of the esophagus  . ESOPHAGOGASTRODUODENOSCOPY N/A 09/25/2012   TFQ:[id number] atonic baggy esophagus status post Maloney dilation 56 F. Hiatal hernia  . GIVENS CAPSULE STUDY N/A 01/15/2013   NORMAL.   . IR GENERIC HISTORICAL  03/17/2016   IR RADIOLOGIST EVAL & MGMT 03/17/2016 [facility]-INTERV RAD  . LESION REMOVAL N/A 05/31/2015   Procedure: REMOVAL RIGHT AND LEFT LESIONS OF MANDIBLE;  Surgeon: Ocie Doyne, DDS;  Location: [facility] OR;  Service: Oral Surgery;  Laterality: N/A;  . MALONEY DILATION  12/29/2010   RMR;  . NM MYOCAR PERF WALL MOTION  2006   "relavtiely normal" persantine, mild anterior thinning (breast attenuation artifact), no region of scar/ischemia  . OVARIAN CYST REMOVAL    . RECTOCELE REPAIR N/A 06/29/2015   Procedure: POSTERIOR REPAIR (RECTOCELE);  Surgeon: Tilda Burrow, MD;  Location: [facility] ORS;  Service: Gynecology;  Laterality: N/A;  . SPINE SURGERY  09/29/2010   Dr. Shon Baton  . surgical excision of 3 tumors from right thigh and right  buttock  and left upper thigh  2010  . TOOTH EXTRACTION Bilateral 12/14/2014   Procedure: REMOVAL OF BILATERAL MANDIBULAR EXOSTOSES;  Surgeon: Diona Browner, DDS;  Location: [facility];  Service: Oral Surgery;  Laterality: Bilateral;  . TRANSTHORACIC ECHOCARDIOGRAM  2010   EF 60-65%, mild conc LVH, grade 1 diastolic dysfunction; mildly calcified MV annulus with mildly thickened leaflets, mildly calcified MR annulus    OB History    Gravida Para Term Preterm AB Living   6 1 1   5      SAB TAB Ectopic Multiple Live Births   5               Home Medications    Prior to Admission medications   Medication Sig Start Date End Date Taking? Authorizing Provider  NOVOLOG FLEXPEN 100 UNIT/ML FlexPen INJECT 16 TO 30 UNITS INTO THE SKIN 3 (THREE) TIMES DAILY WITH MEALS. 01/06/16  Yes Philemon Kingdom, MD  vitamin B-12 (CYANOCOBALAMIN) 1000 MCG tablet Take 1,000 mcg by mouth daily.   Yes Historical Provider, MD  vitamin E 400  UNIT capsule Take 400 Units by mouth daily.   Yes Historical Provider, MD  ACCU-CHEK FASTCLIX LANCETS MISC Use to test blood sugar 4 times daily. Dx: E10.65 12/03/14   Philemon Kingdom, MD  ACCU-CHEK SMARTVIEW test strip TEST BLOOD SUGAR FOUR TIMES DAILY AS DIRECTED 01/10/16   Philemon Kingdom, MD  albuterol (PROVENTIL HFA;VENTOLIN HFA) 108 (90 BASE) MCG/ACT inhaler Inhale 1 puff into the lungs 3 (three) times daily. 02/22/15   Rexene Alberts, MD  Alcohol Swabs (B-D SINGLE USE SWABS REGULAR) PADS Use for injections and glucose testing 8 times daily. Dx: E10.65 12/03/14   Philemon Kingdom, MD  amLODipine (NORVASC) 5 MG tablet TAKE 2 TABLETS BY MOUTH ONCE DAILY. 04/24/16   Philemon Kingdom, MD  aspirin EC 81 MG tablet Take 81 mg by mouth daily.    Historical Provider, MD  Azelastine-Fluticasone (DYMISTA) 137-50 MCG/ACT SUSP Place 1 puff into the nose at bedtime. 07/27/15   Deneise Lever, MD  BD PEN NEEDLE NANO U/F 32G X 4 MM MISC USE FOUR TIMES DAILY 04/20/15   Philemon Kingdom, MD  Blood Glucose Calibration (ACCU-CHEK SMARTVIEW CONTROL) LIQD 1 each by Other route as needed. 12/03/14   Philemon Kingdom, MD  Blood Glucose Monitoring Suppl (ACCU-CHEK NANO SMARTVIEW) W/DEVICE KIT 1 each by Does not apply route daily. Dx: E10.65 12/03/14   Philemon Kingdom, MD  Cholecalciferol (VITAMIN D) 2000 units CAPS Take 1 capsule by mouth daily.    Historical Provider, MD  clobetasol cream (TEMOVATE) 2.33 % Apply 1 application topically daily.    Historical Provider, MD  cloNIDine (CATAPRES) 0.3 MG tablet TAKE 1 TABLET BY MOUTH EVERY EIGHT HOURS. ONCE AT 8AM, 4PM, AND 12 MIDNIGHT. 05/02/16   [name], MD  diclofenac sodium (VOLTAREN) 1 % GEL Apply 2 g topically daily as needed (Pain). 06/22/15   [name], MD  dicyclomine (BENTYL) 10 MG capsule TAKE 1 CAPSULE BY MOUTH THREE TIMES DAILY BEFORE MEALS. 01/05/16   [name], MD  hydroxychloroquine (PLAQUENIL) 200 MG tablet Take 200 mg by mouth daily.     Historical Provider, MD  Insulin Glargine (TOUJEO SOLOSTAR) 300 UNIT/ML SOPN Inject 25 Units into the skin at bedtime. 02/02/16   Philemon Kingdom, MD  lamoTRIgine (LAMICTAL) 100 MG tablet TAKE 1 TABLET BY MOUTH TWICE DAILY. 10/08/15   Pieter Partridge, DO  levothyroxine (SYNTHROID, LEVOTHROID) 50 MCG tablet TAKE 1 TABLET BY MOUTH  ONCE DAILY AND 1/2 TABLET ON SUNDAYS. 05/02/16   Philemon Kingdom, MD  LORazepam (ATIVAN) 0.5 MG tablet Take 1 tablet (0.5 mg total) by mouth 3 (three) times daily. 02/21/16   Cloria Spring, MD  losartan (COZAAR) 50 MG tablet TAKE ONE TABLET BY MOUTH DAILY. 03/03/16   [name], MD  methocarbamol (ROBAXIN) 500 MG tablet Take 500 mg by mouth daily. 12/21/14   Historical Provider, MD  metoprolol (LOPRESSOR) 50 MG tablet TAKE 1 TABLET BY MOUTH TWICE DAILY. 11/10/15   Pixie Casino, MD  montelukast (SINGULAIR) 10 MG tablet TAKE ONE TABLET BY MOUTH ONCE DAILY. 05/02/16   [name], MD  Multiple Vitamins-Minerals (ONE-A-DAY 50 PLUS PO) Take 1 tablet by mouth daily.    Historical Provider, MD  nystatin (MYCOSTATIN/NYSTOP) powder APPLY TO AFFECTED AREA FOUR TIMES DAILY. 04/03/16   [name], MD  ondansetron (ZOFRAN) 4 MG tablet Take 1 tablet (4 mg total) by mouth every 8 (eight) hours as needed for nausea or vomiting. 12/29/15   [name], MD  Plecanatide (TRULANCE) 3 MG TABS Take 3 mg by mouth daily. 01/05/16   Jerene Bears, MD  pregabalin (LYRICA) 75 MG capsule Take 75 mg by mouth 2 (two) times daily.  10/26/11   Carole Civil, MD  RABEprazole (ACIPHEX) 20 MG tablet TAKE 1 TABLET BY MOUTH TWICE DAILY. 03/28/16   Mahala Menghini, PA-C  RESTASIS 0.05 % ophthalmic emulsion Place 1 drop into both eyes 2 (two) times daily.  02/04/14   Historical Provider, MD  risperiDONE (RISPERDAL) 0.5 MG tablet Take 1 tablet (0.5 mg total) by mouth at bedtime. 02/21/16   Cloria Spring, MD  rosuvastatin (CRESTOR) 5 MG tablet TAKE 1 TABLET BY MOUTH AT BEDTIME.  05/02/16   [name], MD  sertraline (ZOLOFT) 100 MG tablet Take 2 tablets (200 mg total) by mouth daily. 02/21/16   Cloria Spring, MD  traZODone (DESYREL) 150 MG tablet Take 2 tablets (300 mg total) by mouth at bedtime. 02/21/16   Cloria Spring, MD    Family History Family History  Problem Relation Age of Onset  . Heart attack Mother     HTN  . Pneumonia Father   . Kidney failure Father   . Diabetes Father   . Pancreatic cancer Sister   . Diabetes Brother   . Hypertension Brother   . Diabetes Brother   . Cancer Sister     breast   . Hypertension Son   . Sleep apnea Son   . Cancer Sister     pancreatic  . Stroke Maternal Grandmother   . Heart attack Maternal Grandfather   . Alcohol abuse Maternal Uncle   . Colon cancer Neg Hx   . Anesthesia problems Neg Hx   . Hypotension Neg Hx   . Malignant hyperthermia Neg Hx   . Pseudochol deficiency Neg Hx     Social History Social History  Substance Use Topics  . Smoking status: Light Tobacco Smoker    Packs/day: 0.25    Years: 7.00    Types: Cigarettes  . Smokeless tobacco: Never Used     Comment: continues to smoke 1/4 pack a day   . Alcohol use No     Comment:       Allergies   Cephalexin; Iron; Milk-related compounds; Penicillins; and Phenazopyridine hcl   Review of Systems Review of Systems  Constitutional: Negative for appetite change and fatigue.  HENT: Negative for  congestion, ear discharge and sinus pressure.   Eyes: Negative for discharge.  Respiratory: Negative for cough.   Cardiovascular: Negative for chest pain.  Gastrointestinal: Negative for abdominal pain and diarrhea.  Genitourinary: Positive for flank pain. Negative for frequency and hematuria.  Musculoskeletal: Negative for back pain.  Skin: Negative for rash.  Neurological: Negative for seizures and headaches.  Psychiatric/Behavioral: Negative for hallucinations.     Physical Exam Updated Vital Signs BP 159/90   Pulse 66   Temp  98.9 F (37.2 C) (Oral)   Resp 18   Ht 4' 11"  (1.499 m)   Wt 162 lb (73.5 kg)   SpO2 99%   BMI 32.72 kg/m   Physical Exam  Constitutional: She is oriented to person, place, and time. She appears well-developed.  HENT:  Head: Normocephalic.  Eyes: Conjunctivae and EOM are normal. No scleral icterus.  Neck: Neck supple. No thyromegaly present.  Cardiovascular: Normal rate and regular rhythm.  Exam reveals no gallop and no friction rub.   No murmur heard. Pulmonary/Chest: No stridor. She has no wheezes. She has no rales. She exhibits no tenderness.  Abdominal: She exhibits no distension. There is no tenderness. There is no rebound.  Genitourinary:  Genitourinary Comments: Tender right flank  Musculoskeletal: Normal range of motion. She exhibits no edema.  Lymphadenopathy:    She has no cervical adenopathy.  Neurological: She is oriented to person, place, and time. She exhibits normal muscle tone. Coordination normal.  Skin: No rash noted. No erythema.  Psychiatric: She has a normal mood and affect. Her behavior is normal.     ED Treatments / Results  Labs (all labs ordered are listed, but only abnormal results are displayed) Labs Reviewed  CBC WITH DIFFERENTIAL/PLATELET - Abnormal; Notable for the following:       Result Value   Hemoglobin 10.7 (*)    HCT 34.6 (*)    MCV 74.2 (*)    MCH 23.0 (*)    All other components within normal limits  COMPREHENSIVE METABOLIC PANEL - Abnormal; Notable for the following:    Potassium 3.1 (*)    All other components within normal limits  URINALYSIS, ROUTINE W REFLEX MICROSCOPIC - Abnormal; Notable for the following:    Color, Urine STRAW (*)    All other components within normal limits  LIPASE, BLOOD    EKG  EKG Interpretation None       Radiology Ct Abdomen Pelvis W Contrast  Result Date: 05/14/2016 CLINICAL DATA:  Right lower quadrant pain. Diabetes. Constipation. COPD. irritable bowel syndrome. Gastroesophageal reflux  disease. Pancreatitis. EXAM: CT ABDOMEN AND PELVIS WITH CONTRAST TECHNIQUE: Multidetector CT imaging of the abdomen and pelvis was performed using the standard protocol following bolus administration of intravenous contrast. CONTRAST:  154m ISOVUE-300 IOPAMIDOL (ISOVUE-300) INJECTION 61% COMPARISON:  06/02/2014 FINDINGS: Lower chest: Clear lung bases. Mild cardiomegaly. Multivessel coronary artery atherosclerosis. Hepatobiliary: Normal liver. Cholecystectomy, without biliary ductal dilatation. Pancreas: Normal, without mass or ductal dilatation. Spleen: Normal in size, without focal abnormality. Adrenals/Urinary Tract: Normal adrenal glands. Normal kidneys, without hydronephrosis. Normal urinary bladder. Stomach/Bowel: Normal stomach, without wall thickening. Scattered colonic diverticula. Normal terminal ileum. Normal appendix, including on image 60/series 2. Normal small bowel. Vascular/Lymphatic: Aortic and branch vessel atherosclerosis. No abdominopelvic adenopathy. Reproductive: Hysterectomy.  No adnexal mass. Other: No significant free fluid.  Mild pelvic floor laxity. Musculoskeletal: Lumbosacral spondylosis IMPRESSION: 1.  No acute process in the abdomen or pelvis. 2.  Coronary artery atherosclerosis. Aortic atherosclerosis. 3. Pelvic floor laxity. Electronically  Signed   By: Abigail Miyamoto M.D.   On: 05/14/2016 17:15    Procedures Procedures (including critical care time)  Medications Ordered in ED Medications  ondansetron (ZOFRAN) injection 4 mg (not administered)  iopamidol (ISOVUE-300) 61 % injection (30 mLs Oral Contrast Given 05/14/16 1525)  iopamidol (ISOVUE-300) 61 % injection 100 mL (100 mLs Intravenous Contrast Given 05/14/16 1647)     Initial Impression / Assessment and Plan / ED Course  I have reviewed the triage vital signs and the nursing notes.  Pertinent labs & imaging results that were available during my care of the patient were reviewed by me and considered in my medical  decision making (see chart for details).     Labs including urinalysis also CT scan all negative. Patient's pain has improved. She'll follow-up with her doctor  Final Clinical Impressions(s) / ED Diagnoses   Final diagnoses:  Flank pain    New Prescriptions New Prescriptions   No medications on file     Milton Ferguson, MD 05/14/16 [phone]

## 2016-05-14 NOTE — Discharge Instructions (Signed)
Follow-up with her doctor as planned

## 2016-05-14 NOTE — ED Triage Notes (Signed)
Pt comes in by EMS from home. She states she thinks she has a leg wound on her left foot. Pt had toe nail removed on left foot second toe. Pt was seen at wound clinic Thursday. Pt states she is having lower back pain and a headache.

## 2016-05-16 ENCOUNTER — Telehealth: Payer: Self-pay | Admitting: Vascular Surgery

## 2016-05-16 NOTE — Addendum Note (Signed)
Addended by: Lianne Cure A on: 05/16/2016 03:18 PM   Modules accepted: Orders

## 2016-05-16 NOTE — Telephone Encounter (Signed)
I called to confirm the patient's MRI appointment at [facility] on 3/8 @ 5pm. The patient states she will check with transportation and call me back.

## 2016-05-18 ENCOUNTER — Ambulatory Visit (HOSPITAL_COMMUNITY)
Admission: RE | Admit: 2016-05-18 | Discharge: 2016-05-18 | Disposition: A | Discharge status: Home / Self Care | Payer: Medicare HMO | Source: Ambulatory Visit | Attending: Vascular Surgery | Admitting: Vascular Surgery

## 2016-05-18 DIAGNOSIS — E119 Type 2 diabetes mellitus without complications: Secondary | ICD-10-CM | POA: Diagnosis not present

## 2016-05-18 DIAGNOSIS — Z8631 Personal history of diabetic foot ulcer: Secondary | ICD-10-CM | POA: Diagnosis not present

## 2016-05-18 DIAGNOSIS — L03116 Cellulitis of left lower limb: Secondary | ICD-10-CM | POA: Diagnosis not present

## 2016-05-18 DIAGNOSIS — F17200 Nicotine dependence, unspecified, uncomplicated: Secondary | ICD-10-CM | POA: Diagnosis not present

## 2016-05-18 DIAGNOSIS — L97522 Non-pressure chronic ulcer of other part of left foot with fat layer exposed: Secondary | ICD-10-CM | POA: Insufficient documentation

## 2016-05-18 DIAGNOSIS — E11621 Type 2 diabetes mellitus with foot ulcer: Secondary | ICD-10-CM | POA: Diagnosis not present

## 2016-05-18 DIAGNOSIS — L97529 Non-pressure chronic ulcer of other part of left foot with unspecified severity: Secondary | ICD-10-CM | POA: Diagnosis not present

## 2016-05-18 DIAGNOSIS — Z09 Encounter for follow-up examination after completed treatment for conditions other than malignant neoplasm: Secondary | ICD-10-CM | POA: Diagnosis not present

## 2016-05-18 DIAGNOSIS — L97521 Non-pressure chronic ulcer of other part of left foot limited to breakdown of skin: Secondary | ICD-10-CM | POA: Diagnosis not present

## 2016-05-18 MED ORDER — GADOBENATE DIMEGLUMINE 529 MG/ML IV SOLN
15.0000 mL | Freq: Once | INTRAVENOUS | Status: AC | PRN
  Administered 2016-05-18: 15 mL via INTRAVENOUS

## 2016-05-19 ENCOUNTER — Encounter (INDEPENDENT_AMBULATORY_CARE_PROVIDER_SITE_OTHER): Payer: Self-pay | Admitting: Orthopedic Surgery

## 2016-05-19 ENCOUNTER — Ambulatory Visit (INDEPENDENT_AMBULATORY_CARE_PROVIDER_SITE_OTHER): Payer: Medicare HMO | Admitting: Orthopedic Surgery

## 2016-05-19 DIAGNOSIS — F17200 Nicotine dependence, unspecified, uncomplicated: Secondary | ICD-10-CM

## 2016-05-19 DIAGNOSIS — E1142 Type 2 diabetes mellitus with diabetic polyneuropathy: Secondary | ICD-10-CM

## 2016-05-19 DIAGNOSIS — L97521 Non-pressure chronic ulcer of other part of left foot limited to breakdown of skin: Secondary | ICD-10-CM | POA: Diagnosis not present

## 2016-05-19 NOTE — Progress Notes (Signed)
Office Visit Note   Patient: [name]           Date of Birth: [birth year]           MRN: [id number] Visit Date: 05/19/2016              Requested by: Conrad Burnett, MD [address] [place], [place] [address] PCP: Tula Nakayama, MD   Assessment & Plan: Visit Diagnoses:  1. Diabetic polyneuropathy associated with type 2 diabetes mellitus ([place])   2. Ulcer of toe, left, limited to breakdown of skin ([place])     Plan: Discussed the importance of smoking cessation. I discussed that with diabetes and smoking she is at high risk for a transtibial amputation. Continue with her postoperative shoe and reevaluated 4 weeks. No indications for surgery at this time.  Follow-Up Instructions: Return in about 4 weeks (around 06/16/2016).   Orders:  No orders of the defined types were placed in this encounter.  No orders of the defined types were placed in this encounter.     Procedures: No procedures performed   Clinical Data: No additional findings.   Subjective: Chief Complaint  Patient presents with  . Left Foot - Wound Check    Patient presents with wound left second toe. She is referred by Dr. Bridgett Larsson from [facility]. She states that she noticed this wound in January and was treated by a podiatrist. It did not get any better and started draining, so she was sent to see Dr. Bridgett Larsson. She has had a MRI of the left foot. She is doing dry dressing changes 2 x daily. She denies putting anything on the wound, and states that she has not been on an antibiotic at all. She is complaining of pain in her left great toe as well.     Review of Systems complete review of systems negative except as mentioned in the history of present illness. Patient is a smoker with diabetes.   Objective: Vital Signs: There were no vitals taken for this visit.  Physical Exam examination patient is extremely sleepy she states her blood sugar was 200 this morning when she checked she does not feel she is hypoglycemic. She  states that this is her normal. She is oriented well-developed well-nourished she has an antalgic gait. Examination the left foot she has a strong dorsalis pedis pulse. She has been evaluated by vascular surgery and not felt to have any vascular compromise. She has no redness no cellulitis no swelling of her toes. She has a superficial ulcer on the second toe left foot this does not extend down to bone or tendon. There is no sausage digit swelling. Review of MRI scan shows no evidence of abscess no osteomyelitis.  Ortho Exam  Specialty Comments:  No specialty comments available.  Imaging: Mr Foot Left W Wo Contrast  Result Date: 05/18/2016 CLINICAL DATA:  Ulceration off of the left second toe. EXAM: MRI OF THE LEFT FOREFOOT WITHOUT AND WITH CONTRAST TECHNIQUE: Multiplanar, multisequence MR imaging of the left foot was performed both before and after administration of intravenous contrast. CONTRAST:  22mL MULTIHANCE GADOBENATE DIMEGLUMINE 529 MG/ML IV SOLN COMPARISON:  Left foot radiographs 05/09/2016 FINDINGS: There is soft tissue swelling/edema/fluid involving the second toe and dorsal forefoot. Findings suggest cellulitis. No discrete drainable soft tissue abscess is identified. No MR findings to suggest septic arthritis or osteomyelitis. No findings for myofasciitis or pyomyositis. IMPRESSION: Cellulitis but no drainable soft tissue abscess. No myofasciitis or pyomyositis. No findings for  septic arthritis or osteomyelitis. Electronically Signed   By: Marijo Sanes M.D.   On: 05/18/2016 18:32     PMFS History: Patient Active Problem List   Diagnosis Date Noted  . Diabetic polyneuropathy associated with type 2 diabetes mellitus ([place]) 05/19/2016  . Cigarette smoker 04/24/2016  . Class 1 obesity with serious comorbidity and body mass index (BMI) of 33.0 to 33.9 in adult 04/24/2016  . Ulcer of toe, left, limited to breakdown of skin ([place]) 04/18/2016  . History of palpitations 08/09/2015  . Nausea  without vomiting 08/09/2015  . Labile hypertension 08/03/2015  . Normal coronary arteries 08/03/2015  . Left-sided low back pain with left-sided sciatica 06/27/2015  . Left flank pain 06/27/2015  . Acute cystitis without hematuria 05/10/2015  . Poorly controlled type 2 diabetes mellitus with circulatory disorder ([place]) 05/06/2015  . Multinodular goiter 05/06/2015  . Rectocele, female 04/27/2015  . Anal sphincter incontinence 04/27/2015  . Pelvic relaxation due to rectocele 03/30/2015  . Pulmonary hypertension 02/22/2015  . Weakness of left upper extremity 02/21/2015  . Light cigarette smoker (1-9 cigarettes per day) 01/11/2015  . Migraine without aura and without status migrainosus, not intractable 07/02/2014  . Flatulence 02/18/2014  . Microcytic anemia 02/18/2014  . COPD (chronic obstructive pulmonary disease) with chronic bronchitis ([place]) 09/16/2013  . Hypothyroidism 08/16/2013  . Gastroparesis 04/28/2013  . Seizure disorder ([place]) 01/19/2013  . Cervical disc disorder with radiculopathy of cervical region 10/31/2012  . Solitary pulmonary nodule 08/19/2012  . Anemia 07/05/2012  . 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 Allergic sinusitis 04/18/2012  . Meningioma ([place]) 11/19/2011  . 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 Hemiplegia affecting non-dominant side, post-stroke ([place]) 08/02/2011  . Carpal tunnel syndrome of right wrist 05/23/2011  . Polypharmacy 04/28/2011  . Bipolar disorder ([place]) 04/28/2011  . Constipation 04/13/2011  . Falls frequently 12/12/2010  . Urinary incontinence 12/16/2009  . HEARING LOSS 10/26/2009  . Hyperlipidemia 12/11/2008  . IBS 12/11/2008  . GERD 07/29/2008  . MILK PRODUCTS ALLERGY 07/29/2008  . 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 Backache 06/19/2007  . Osteoporosis 06/19/2007  . Obstructive sleep apnea 06/19/2007  . TRIGGER FINGER 04/18/2007  . DIVERTICULOSIS, COLON 11/13/2006    Past Medical History:  Diagnosis Date  . Anemia   . Anxiety    takes Ativan daily  . Arthritis   . Bipolar disorder ([place])    takes Risperdal nightly  . Blood transfusion   . Cancer ([place])    In her gum  . Carpal tunnel syndrome of right wrist 05/23/2011  . Cervical disc disorder with radiculopathy of cervical region 10/31/2012  . Chronic back pain   . Chronic idiopathic constipation   . Colon polyps   . COPD (chronic obstructive pulmonary disease) with chronic bronchitis ([place]) 09/16/2013   Office Spirometry 10/30/2013-submaximal effort based on appearance of loop and curve. Numbers would fit with severe restriction but her physiologic capability may be better than this. FVC 0.91/44%, and 10.74/45%, FEV1/FVC 0.81, FEF 25-75% 1.43/69%    . Depression    takes Zoloft daily  . Diabetes mellitus    Type II  . Diverticulosis    TCS 9/08 by Dr. Delfin Edis for diarrhea . Bx for micro scopic colitis negative.   . Fibromyalgia   . Frequent falls   . GERD (gastroesophageal reflux disease)    takes Aciphex daily  . Glaucoma    eye drops daily  . Gum symptoms  infection on antibiotic  . Hemiplegia affecting non-dominant side, post-stroke ([place]) 08/02/2011  . Hyperlipidemia    takes Crestor daily  . Hypertension    takes Amlodipine,Metoprolol,and Clonidine daily  . Hypothyroidism    takes Synthroid daily  . IBS (irritable bowel syndrome)   . Insomnia    takes Trazodone nightly  . Metabolic encephalopathy 6/78/9381  . Migraines    chronic headaches  . Mononeuritis lower limb   . Osteoporosis   . Pancreatitis 2006   due to Depakote with normal EUS   . Schatzki's ring    non critical / EGD with ED 8/2011with RMR  . Seizures ([place])    takes Lamictal daily.Last seizure 3 yrs ago  . Sleep apnea    on CPAP  . Stroke [facility])    left sided weakness  . Tubular adenoma of colon     Family History  Problem Relation Age of Onset  . Heart attack Mother     HTN  . Pneumonia Father   .  Kidney failure Father   . Diabetes Father   . Pancreatic cancer Sister   . Diabetes Brother   . Hypertension Brother   . Diabetes Brother   . Cancer Sister     breast   . Hypertension Son   . Sleep apnea Son   . Cancer Sister     pancreatic  . Stroke Maternal Grandmother   . Heart attack Maternal Grandfather   . Alcohol abuse Maternal Uncle   . Colon cancer Neg Hx   . Anesthesia problems Neg Hx   . Hypotension Neg Hx   . Malignant hyperthermia Neg Hx   . Pseudochol deficiency Neg Hx     Past Surgical History:  Procedure Laterality Date  . ABDOMINAL HYSTERECTOMY  1978  . BACK SURGERY  July 2012  . BACTERIAL OVERGROWTH TEST N/A 05/05/2013   Procedure: BACTERIAL OVERGROWTH TEST;  Surgeon: Daneil Dolin, MD;  [facility];  Service: Endoscopy;  Laterality: N/A;  7:30  . BIOPSY THYROID  2009  . BRAIN SURGERY  11/2011   resection of meningioma  . BREAST REDUCTION SURGERY  1994  . CARDIAC CATHETERIZATION  05/10/2005   normal coronaries, normal LV systolic function and EF (Dr. Jackie Plum)  . CARPAL TUNNEL RELEASE Left 07/22/04   Dr. Aline Brochure  . CATARACT EXTRACTION Bilateral   . CHOLECYSTECTOMY  1984  . COLONOSCOPY N/A 09/25/2012   OFB:PZWCHEN diverticulosis.  colonic polyp-removed : tubular adenoma  . CRANIOTOMY  11/23/2011   Procedure: CRANIOTOMY TUMOR EXCISION;  Surgeon: Hosie Spangle, MD;  Location: [facility];  Service: Neurosurgery;  Laterality: N/A;  Craniotomy for tumor resection  . ESOPHAGOGASTRODUODENOSCOPY  12/29/2010   R[place]k-Retained food in the esophagus and stomach, small hiatal hernia, status post Maloney dilation of the esophagus  . ESOPHAGOGASTRODUODENOSCOPY N/A 09/25/2012   IDP:OEUMPNTI atonic baggy esophagus status post Maloney dilation 56 F. Hiatal hernia  . GIVENS CAPSULE STUDY N/A 01/15/2013   NORMAL.   . IR GENERIC HISTORICAL  03/17/2016   IR RADIOLOGIST EVAL & MGMT 03/17/2016 [facility]  . LESION REMOVAL N/A 05/31/2015   Procedure: REMOVAL RIGHT  AND LEFT LESIONS OF MANDIBLE;  Surgeon: Diona Browner, DDS;  Location: [facility];  Service: Oral Surgery;  Laterality: N/A;  . MALONEY DILATION  12/29/2010   RMR;  . NM MYOCAR PERF WALL MOTION  2006   "relavtiely normal" persantine, mild anterior thinning (breast attenuation artifact), no region of scar/ischemia  . OVARIAN CYST REMOVAL    .  RECTOCELE REPAIR N/A 06/29/2015   Procedure: POSTERIOR REPAIR (RECTOCELE);  Surgeon: Jonnie Kind, MD;  Location: [facility];  Service: Gynecology;  Laterality: N/A;  . SPINE SURGERY  09/29/2010   Dr. Rolena Infante  . surgical excision of 3 tumors from right thigh and right buttock  and left upper thigh  2010  . TOOTH EXTRACTION Bilateral 12/14/2014   Procedure: REMOVAL OF BILATERAL MANDIBULAR EXOSTOSES;  Surgeon: Diona Browner, DDS;  Location: [facility];  Service: Oral Surgery;  Laterality: Bilateral;  . TRANSTHORACIC ECHOCARDIOGRAM  2010   EF 60-65%, mild conc LVH, grade 1 diastolic dysfunction; mildly calcified MV annulus with mildly thickened leaflets, mildly calcified MR annulus   Social History   Occupational History  . disabled     Social History Main Topics  . Smoking status: Light Tobacco Smoker    Packs/day: 0.25    Years: 7.00    Types: Cigarettes  . Smokeless tobacco: Never Used     Comment: continues to smoke 1/4 pack a day   . Alcohol use No     Comment:    . Drug use: No  . Sexual activity: No

## 2016-05-22 ENCOUNTER — Other Ambulatory Visit (HOSPITAL_COMMUNITY): Payer: Self-pay | Admitting: Psychiatry

## 2016-05-22 ENCOUNTER — Other Ambulatory Visit: Payer: Self-pay | Admitting: Internal Medicine

## 2016-05-22 ENCOUNTER — Other Ambulatory Visit: Payer: Self-pay | Admitting: Family Medicine

## 2016-05-22 DIAGNOSIS — I110 Hypertensive heart disease with heart failure: Secondary | ICD-10-CM | POA: Diagnosis not present

## 2016-05-22 DIAGNOSIS — M6281 Muscle weakness (generalized): Secondary | ICD-10-CM | POA: Diagnosis not present

## 2016-05-22 DIAGNOSIS — J449 Chronic obstructive pulmonary disease, unspecified: Secondary | ICD-10-CM | POA: Diagnosis not present

## 2016-05-22 DIAGNOSIS — F17210 Nicotine dependence, cigarettes, uncomplicated: Secondary | ICD-10-CM | POA: Diagnosis not present

## 2016-05-22 DIAGNOSIS — F250 Schizoaffective disorder, bipolar type: Secondary | ICD-10-CM | POA: Diagnosis not present

## 2016-05-22 DIAGNOSIS — E119 Type 2 diabetes mellitus without complications: Secondary | ICD-10-CM | POA: Diagnosis not present

## 2016-05-22 DIAGNOSIS — I69393 Ataxia following cerebral infarction: Secondary | ICD-10-CM | POA: Diagnosis not present

## 2016-05-22 DIAGNOSIS — M545 Low back pain: Secondary | ICD-10-CM | POA: Diagnosis not present

## 2016-05-22 DIAGNOSIS — I509 Heart failure, unspecified: Secondary | ICD-10-CM | POA: Diagnosis not present

## 2016-05-26 ENCOUNTER — Ambulatory Visit (INDEPENDENT_AMBULATORY_CARE_PROVIDER_SITE_OTHER): Payer: Commercial Managed Care - HMO | Admitting: Psychiatry

## 2016-05-26 DIAGNOSIS — F331 Major depressive disorder, recurrent, moderate: Secondary | ICD-10-CM | POA: Diagnosis not present

## 2016-05-26 NOTE — Progress Notes (Signed)
Patient:  [name]   DOB: 1950/03/20  MR Number: [id number]  Location: [facility]:  [address] [place],  [place],          [place]  Start: Friday 05/26/2016 9:10 AM End: Friday 05/26/2016 9:57 AM  Provider/Observer:     Maurice Small, MSW, LCSW   Chief Complaint:     Depression  Reason For Service:               [name] is a 66 y.o. female who presents with a long standing history of recurrent periods of depression beginning when she was 11 and her favorite uncle died. She has been experiencing increased symptoms of depression recently due to stress in the relationship with her son. Per patient's report, he rarely visits her anymore due to his involvement with an older woman who is controlling. She also reports frustration regarding her knees who is her power of attorney and is very demanding per patient's report. She reports issues with other family members and states she needs help dealing with her family Patient reports multiple psychiatric hospitlaizations due to depression and suicidal ideations with thel last one occuring in 1997. Patient has participated in outpatient psychotherapy and medication management intermittently since age 59.  She currently is seeing psychiatrist Dr. Harrington Challenger . Prior to this, she was being seen at [facility]. Patient also has had ECT at [facility].    Interventions Strategy:  Supportive  Participation Level:   Active      Participation Quality:  Appropriate      Behavioral Observation:  Casual, well groomed, drowsy, speech very slow   Current Psychosocial Factors: Tension in relationship with son, multiple health issues, concerns about grandson  Content of Session:   reviewed symptoms, used nondirective techniques to allow patient to verbalize feelings of anger, betrayal, and hurt regarding family member who recently died, praised and reinforced patient's use of assertive communication regarding interaction/situation with aide     Current Status:   less depressed  mood, less anxiety, continued excessive worry  Suicidal/Homicidal:    No   Patient Progress:   Fair. Patient states she has been "fair". She reports increased memories and thoughts of past triggered by recently attending her cousin's funeral. Patient reports being hurt by this cousin due to past betrayal. Patient reports carrying this hurt for the past 2 years and expresses anger regarding the negative impact this cousin had on her life. She reports decreased stress regarding aide as patient reports trying to talk to aide about concerns but reports aide had negative reaction. Patient reports she contacted home health agency and requested another aide who started working with patient this week. Patient reports positive interaction with current aide.   Target Goals:   1.  Learn and implement behavioral strategies to overcome depression.    2.  Verbalize an understanding and resolution of current interpersonal problems.   Last Reviewed:   03/08/2016  Goals Addressed Today:    1, 2  Plan:      Return again in 2 weeks.  Impression/Diagnosis:   Patient presents with long standing history of recurrent periods  of depression beginning when she was [age] and her favorite uncle died. Patient reports multiple psychiatric hospitlaizations due to depression and suicidal ideations with thel last one occuring in 1997. Patient has participated in outpatient psychotherapy and medication management intermittently since age 54.  She currently is seeing psychiatrist Dr. Harrington Challenger . Prior to this, she was being seen  at [facility]. Patient also has had ECT at [facility]. Symptoms have worsened in recent months due to family stress. Current symptoms include depressed mood, anxiety, excessive worry, and tearfulness.   Diagnosis:  Axis I: MDD recurrent episode, moderate          Axis II: Deferred     Almon Whitford, LCSW 05/26/2016

## 2016-05-29 DIAGNOSIS — L409 Psoriasis, unspecified: Secondary | ICD-10-CM | POA: Diagnosis not present

## 2016-05-31 ENCOUNTER — Ambulatory Visit: Payer: Self-pay | Admitting: Internal Medicine

## 2016-05-31 DIAGNOSIS — I509 Heart failure, unspecified: Secondary | ICD-10-CM | POA: Diagnosis not present

## 2016-05-31 DIAGNOSIS — I110 Hypertensive heart disease with heart failure: Secondary | ICD-10-CM | POA: Diagnosis not present

## 2016-05-31 DIAGNOSIS — Z9181 History of falling: Secondary | ICD-10-CM | POA: Diagnosis not present

## 2016-05-31 DIAGNOSIS — Z794 Long term (current) use of insulin: Secondary | ICD-10-CM | POA: Diagnosis not present

## 2016-05-31 DIAGNOSIS — I69393 Ataxia following cerebral infarction: Secondary | ICD-10-CM | POA: Diagnosis not present

## 2016-05-31 DIAGNOSIS — F17210 Nicotine dependence, cigarettes, uncomplicated: Secondary | ICD-10-CM | POA: Diagnosis not present

## 2016-05-31 DIAGNOSIS — E119 Type 2 diabetes mellitus without complications: Secondary | ICD-10-CM | POA: Diagnosis not present

## 2016-05-31 DIAGNOSIS — I639 Cerebral infarction, unspecified: Secondary | ICD-10-CM | POA: Diagnosis not present

## 2016-05-31 DIAGNOSIS — J449 Chronic obstructive pulmonary disease, unspecified: Secondary | ICD-10-CM | POA: Diagnosis not present

## 2016-05-31 DIAGNOSIS — M6281 Muscle weakness (generalized): Secondary | ICD-10-CM | POA: Diagnosis not present

## 2016-05-31 DIAGNOSIS — R32 Unspecified urinary incontinence: Secondary | ICD-10-CM | POA: Diagnosis not present

## 2016-05-31 DIAGNOSIS — F250 Schizoaffective disorder, bipolar type: Secondary | ICD-10-CM | POA: Diagnosis not present

## 2016-05-31 DIAGNOSIS — M150 Primary generalized (osteo)arthritis: Secondary | ICD-10-CM | POA: Diagnosis not present

## 2016-05-31 DIAGNOSIS — M545 Low back pain: Secondary | ICD-10-CM | POA: Diagnosis not present

## 2016-06-05 ENCOUNTER — Other Ambulatory Visit (HOSPITAL_COMMUNITY): Payer: Self-pay | Admitting: Psychiatry

## 2016-06-05 DIAGNOSIS — M79609 Pain in unspecified limb: Secondary | ICD-10-CM | POA: Diagnosis not present

## 2016-06-06 ENCOUNTER — Other Ambulatory Visit (HOSPITAL_COMMUNITY): Payer: Self-pay | Admitting: Psychiatry

## 2016-06-06 DIAGNOSIS — E119 Type 2 diabetes mellitus without complications: Secondary | ICD-10-CM | POA: Diagnosis not present

## 2016-06-06 DIAGNOSIS — M6281 Muscle weakness (generalized): Secondary | ICD-10-CM | POA: Diagnosis not present

## 2016-06-06 DIAGNOSIS — I110 Hypertensive heart disease with heart failure: Secondary | ICD-10-CM | POA: Diagnosis not present

## 2016-06-06 DIAGNOSIS — J449 Chronic obstructive pulmonary disease, unspecified: Secondary | ICD-10-CM | POA: Diagnosis not present

## 2016-06-06 DIAGNOSIS — F17210 Nicotine dependence, cigarettes, uncomplicated: Secondary | ICD-10-CM | POA: Diagnosis not present

## 2016-06-06 DIAGNOSIS — I509 Heart failure, unspecified: Secondary | ICD-10-CM | POA: Diagnosis not present

## 2016-06-06 DIAGNOSIS — M545 Low back pain: Secondary | ICD-10-CM | POA: Diagnosis not present

## 2016-06-06 DIAGNOSIS — F250 Schizoaffective disorder, bipolar type: Secondary | ICD-10-CM | POA: Diagnosis not present

## 2016-06-06 DIAGNOSIS — I69393 Ataxia following cerebral infarction: Secondary | ICD-10-CM | POA: Diagnosis not present

## 2016-06-07 ENCOUNTER — Other Ambulatory Visit (HOSPITAL_COMMUNITY): Payer: Self-pay | Admitting: Psychiatry

## 2016-06-07 ENCOUNTER — Telehealth: Payer: Self-pay | Admitting: General Practice

## 2016-06-07 ENCOUNTER — Encounter: Payer: Self-pay | Admitting: General Practice

## 2016-06-07 NOTE — Telephone Encounter (Signed)
Noted  

## 2016-06-07 NOTE — Telephone Encounter (Signed)
Called pt to resch for sooner appt. lmtcb

## 2016-06-07 NOTE — Telephone Encounter (Signed)
We received a PA for the patient's Aciphex, however it's noted the patient does not wish to continue care here with [name] and has established care with [facility] in [place].  I called the patient to confirm she does not wish to continue seeing [name] and she will have [facility] refill her Aciphex.  I will mail out a discharge letter.  Routing to Dr, Gala Romney as a Juluis Rainier

## 2016-06-07 NOTE — Telephone Encounter (Signed)
Communication noted. She's been seen by [name] at least going back to October of last year. Agree with discharge letter. No further prescriptions to this office.

## 2016-06-08 ENCOUNTER — Other Ambulatory Visit (HOSPITAL_COMMUNITY): Payer: Self-pay | Admitting: Psychiatry

## 2016-06-08 ENCOUNTER — Telehealth (HOSPITAL_COMMUNITY): Payer: Self-pay | Admitting: *Deleted

## 2016-06-08 NOTE — Telephone Encounter (Signed)
patient called, said she need Trazodone and Risperdal.   She uses [facility].

## 2016-06-12 ENCOUNTER — Telehealth: Payer: Self-pay | Admitting: Internal Medicine

## 2016-06-12 MED ORDER — RABEPRAZOLE SODIUM 20 MG PO TBEC
20.0000 mg | DELAYED_RELEASE_TABLET | Freq: Two times a day (BID) | ORAL | 1 refills | Status: DC
Start: 2016-06-12 — End: 2016-12-01

## 2016-06-12 NOTE — Telephone Encounter (Signed)
Per [name], patient picked up 3 month supply of Bentyl rx'ed by Dr Tula Nakayama last month so she should have 2 more months supply at home. She also has 1 additional refill at the pharmacy. She states she has a home nurse that helps with her medications so she will let the nurse know this. I have also advised that we have sent a prescription for Aciphex to the pharmacy for her.

## 2016-06-14 ENCOUNTER — Ambulatory Visit (INDEPENDENT_AMBULATORY_CARE_PROVIDER_SITE_OTHER): Payer: Medicare HMO | Admitting: Podiatry

## 2016-06-14 ENCOUNTER — Ambulatory Visit (INDEPENDENT_AMBULATORY_CARE_PROVIDER_SITE_OTHER): Payer: Medicare HMO

## 2016-06-14 ENCOUNTER — Encounter: Payer: Self-pay | Admitting: Podiatry

## 2016-06-14 VITALS — BP 135/69 | HR 65

## 2016-06-14 DIAGNOSIS — Z9889 Other specified postprocedural states: Secondary | ICD-10-CM

## 2016-06-14 DIAGNOSIS — B351 Tinea unguium: Secondary | ICD-10-CM

## 2016-06-14 DIAGNOSIS — Q822 Mastocytosis: Secondary | ICD-10-CM | POA: Diagnosis not present

## 2016-06-14 DIAGNOSIS — R52 Pain, unspecified: Secondary | ICD-10-CM

## 2016-06-14 DIAGNOSIS — Q828 Other specified congenital malformations of skin: Secondary | ICD-10-CM | POA: Diagnosis not present

## 2016-06-14 DIAGNOSIS — M79676 Pain in unspecified toe(s): Secondary | ICD-10-CM | POA: Diagnosis not present

## 2016-06-14 MED ORDER — DOXYCYCLINE HYCLATE 100 MG PO TABS: 100.0000 mg | ORAL_TABLET | Freq: Two times a day (BID) | ORAL | 0 refills | Status: DC

## 2016-06-14 NOTE — Progress Notes (Signed)
   Subjective:    Patient ID: [name], female    DOB: 05/13/1950, 66 y.o.   MRN: [id number]  HPI this patient presents to the office with a primary complaint of a painful second toe left foot.  She says she had [facility] following the surgery. The toe became extremely painful and there was drainage from the toe for whatever reason, the patient refused to return to the [facility] for follow-up for her nail surgery. She now presents the office. She's having severe pain noted at the site of the nail surgery and wearing a surgical shoe. She also requests an evaluation and treatment of her long thick painful nails with associated callus on the bottom of her right foot. He office today for an evaluation and treatment of this condition. This patient is also diabetic    Review of Systems  All other systems reviewed and are negative.      Objective:   Physical Exam GENERAL APPEARANCE: Alert, conversant. Appropriately groomed. No acute distress.  VASCULAR: Pedal pulses are  palpable at  DP and PT bilateral.  Capillary refill time is immediate to all digits,   NEUROLOGIC: sensation is diminished  to 5.07 monofilament at 5/5 sites bilateral.  Light touch is intact bilateral, Muscle strength normal.  MUSCULOSKELETAL: acceptable muscle strength, tone and stability bilateral.  Intrinsic muscluature intact bilateral.  HAV  B/L.Thickened PIPJ second toe left foot.  DERMATOLOGIC: skin color, texture, and turgor are within normal limits.  No preulcerative lesions or ulcers  are seen, no interdigital maceration noted.  No open lesions present.  . No drainage noted.  NAILS  thick disfigured discolored nails 1 through 5 bilaterally except for the second toenail left foot at the second toenail left foot. There is healing and absence of the nail plate noted. There is no evidence of any redness, swelling or drainage noted from this area calluses noted at  the proximal nail fold. No evidence of any redness, swelling or infection         Assessment & Plan:  S?P nail surgery  Onychomycosis  B/L  Porokeratosis right foot.    IE  After evaluating her second toe left foot. X-rays were taken which revealed no evidence of any the site of her pain had a topical anesthetic applied to help to reduce her pain. She was also called in a prescription of doxycycline. #2000 mg 1 twice a day.  I discussed this condition with the patient and told her the area appears to has healed nicely except she is having pain out of proportion. I told her to try the topical  medication as well as the antibiotics call the office if needed. Return to the clinic in 3 months for continued preventative foot care services   Gardiner Barefoot DPM

## 2016-06-16 ENCOUNTER — Ambulatory Visit (INDEPENDENT_AMBULATORY_CARE_PROVIDER_SITE_OTHER): Payer: Medicare HMO | Admitting: Orthopedic Surgery

## 2016-06-16 ENCOUNTER — Ambulatory Visit (INDEPENDENT_AMBULATORY_CARE_PROVIDER_SITE_OTHER): Payer: Medicare HMO | Admitting: Family

## 2016-06-16 ENCOUNTER — Encounter (INDEPENDENT_AMBULATORY_CARE_PROVIDER_SITE_OTHER): Payer: Self-pay | Admitting: Family

## 2016-06-16 VITALS — Ht 59.0 in | Wt 162.0 lb

## 2016-06-16 DIAGNOSIS — E1165 Type 2 diabetes mellitus with hyperglycemia: Secondary | ICD-10-CM

## 2016-06-16 DIAGNOSIS — L97521 Non-pressure chronic ulcer of other part of left foot limited to breakdown of skin: Secondary | ICD-10-CM

## 2016-06-16 DIAGNOSIS — E1159 Type 2 diabetes mellitus with other circulatory complications: Secondary | ICD-10-CM | POA: Diagnosis not present

## 2016-06-16 NOTE — Progress Notes (Signed)
Office Visit Note   Patient: [name]           Date of Birth: 1950-12-20           MRN: [id number] Visit Date: 06/16/2016              Requested by: [name], MD [address], [place], [place] [address] PCP: Tula Nakayama, MD   Assessment & Plan: Visit Diagnoses:  1. Ulcer of toe, left, limited to breakdown of skin ([place])   2. Poorly controlled type 2 diabetes mellitus with circulatory disorder [facility])     Plan: Discussed the importance of smoking cessation. I discussed that with diabetes and smoking she is at high risk for a transtibial amputation. Continue with her postoperative shoe and reevaluated 4 weeks. No indications for surgery at this time.  Follow-Up Instructions: Return in about 2 months (around 08/16/2016).   Orders:  No orders of the defined types were placed in this encounter.  No orders of the defined types were placed in this encounter.     Procedures: No procedures performed   Clinical Data: No additional findings.   Subjective: Chief Complaint  Patient presents with  . Left Foot - Follow-up    Second toe ulcer    Patient is a 66 year old woman who presents with a healed wound left second toe. The second toenail plate is absent, was surgically excised by podiatry.   She was referred by Dr. Bridgett Larsson from [facility]. She states that she noticed this wound in January and was treated by a podiatrist. It did not get any better and started draining, so she was sent to see Dr. Bridgett Larsson. She has had a MRI of the left foot which was negative for osteomyelitis.  Today presents for reevaluation. Was seen by podiatry yesterday for same. There is no open ulceration. Does complain of tenderness in the second toe. States Dr. Paulla Dolly did send in a doxycycline yesterday for her due to continued pain.    Review of Systems  Constitutional: Negative for chills and fever.  Cardiovascular: Negative for leg swelling.  Musculoskeletal: Positive for arthralgias.  Negative for gait problem.  Skin: Negative for color change and wound.   complete review of systems negative except as mentioned in the history of present illness. Patient is a smoker with diabetes.   Objective: Vital Signs: Ht 4\' 11"  (1.499 m)   Wt 162 lb (73.5 kg)   BMI 32.72 kg/m   Physical Exam examination: She is oriented well-developed well-nourished she has an antalgic gait. Examination the left foot she has a strong dorsalis pedis pulse. She has no redness no cellulitis no swelling of her toes. She has healed ulceration on the second toe left foot. There is no sausage digit swelling.   Ortho Exam  Specialty Comments:  No specialty comments available.  Imaging: No results found.   PMFS History: Patient Active Problem List   Diagnosis Date Noted  . Diabetic polyneuropathy associated with type 2 diabetes mellitus ([place]) 05/19/2016  . Smoker 04/24/2016  . Class 1 obesity with serious comorbidity and body mass index (BMI) of 33.0 to 33.9 in adult 04/24/2016  . Ulcer of toe, left, limited to breakdown of skin ([place]) 04/18/2016  . History of palpitations 08/09/2015  . Nausea without vomiting 08/09/2015  . Labile hypertension 08/03/2015  . Normal coronary arteries 08/03/2015  . Left-sided low back pain with left-sided sciatica 06/27/2015  . Left flank pain 06/27/2015  . Acute  cystitis without hematuria 05/10/2015  . Poorly controlled type 2 diabetes mellitus with circulatory disorder ([place]) 05/06/2015  . Multinodular goiter 05/06/2015  . Rectocele, female 04/27/2015  . Anal sphincter incontinence 04/27/2015  . Pelvic relaxation due to rectocele 03/30/2015  . Pulmonary hypertension 02/22/2015  . Weakness of left upper extremity 02/21/2015  . Light cigarette smoker (1-9 cigarettes per day) 01/11/2015  . Migraine without aura and without status migrainosus, not intractable 07/02/2014  . Flatulence 02/18/2014  . Microcytic anemia 02/18/2014  . COPD (chronic obstructive  pulmonary disease) with chronic bronchitis ([place]) 09/16/2013  . Hypothyroidism 08/16/2013  . Gastroparesis 04/28/2013  . Seizure disorder ([place]) 01/19/2013  . Cervical disc disorder with radiculopathy of cervical region 10/31/2012  . Solitary pulmonary nodule 08/19/2012  . Anemia 07/05/2012  . 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 Allergic sinusitis 04/18/2012  . Meningioma ([place]) 11/19/2011  . 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 Hemiplegia affecting non-dominant side, post-stroke ([place]) 08/02/2011  . Carpal tunnel syndrome of right wrist 05/23/2011  . Polypharmacy 04/28/2011  . Bipolar disorder ([place]) 04/28/2011  . Constipation 04/13/2011  . Falls frequently 12/12/2010  . Urinary incontinence 12/16/2009  . HEARING LOSS 10/26/2009  . Hyperlipidemia 12/11/2008  . IBS 12/11/2008  . GERD 07/29/2008  . MILK PRODUCTS ALLERGY 07/29/2008  . 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 Backache 06/19/2007  . Osteoporosis 06/19/2007  . Obstructive sleep apnea 06/19/2007  . TRIGGER FINGER 04/18/2007  . DIVERTICULOSIS, COLON 11/13/2006   Past Medical History:  Diagnosis Date  . Anemia   . Anxiety    takes Ativan daily  . Arthritis   . Bipolar disorder ([place])    takes Risperdal nightly  . Blood transfusion   . Cancer ([place])    In her gum  . Carpal tunnel syndrome of right wrist 05/23/2011  . Cervical disc disorder with radiculopathy of cervical region 10/31/2012  . Chronic back pain   . Chronic idiopathic constipation   . Colon polyps   . COPD (chronic obstructive pulmonary disease) with chronic bronchitis ([place]) 09/16/2013   Office Spirometry 10/30/2013-submaximal effort based on appearance of loop and curve. Numbers would fit with severe restriction but her physiologic capability may be better than this. FVC 0.91/44%, and 10.74/45%, FEV1/FVC 0.81, FEF 25-75% 1.43/69%    . Depression    takes Zoloft daily  . Diabetes mellitus     Type II  . Diverticulosis    TCS 9/08 by Dr. Delfin Edis for diarrhea . Bx for micro scopic colitis negative.   . Fibromyalgia   . Frequent falls   . GERD (gastroesophageal reflux disease)    takes Aciphex daily  . Glaucoma    eye drops daily  . Gum symptoms    infection on antibiotic  . Hemiplegia affecting non-dominant side, post-stroke ([place]) 08/02/2011  . Hyperlipidemia    takes Crestor daily  . Hypertension    takes Amlodipine,Metoprolol,and Clonidine daily  . Hypothyroidism    takes Synthroid daily  . IBS (irritable bowel syndrome)   . Insomnia    takes Trazodone nightly  . Metabolic encephalopathy 11/04/537  . Migraines    chronic headaches  . Mononeuritis lower limb   . Osteoporosis   . Pancreatitis 2006   due to Depakote with normal EUS   . Schatzki's ring    non critical / EGD with ED 8/2011with RMR  . Seizures ([place])    takes Lamictal daily.Last seizure 3 yrs ago  . Sleep  apnea    on CPAP  . Stroke [facility])    left sided weakness  . Tubular adenoma of colon     Family History  Problem Relation Age of Onset  . Heart attack Mother     HTN  . Pneumonia Father   . Kidney failure Father   . Diabetes Father   . Pancreatic cancer Sister   . Diabetes Brother   . Hypertension Brother   . Diabetes Brother   . Cancer Sister     breast   . Hypertension Son   . Sleep apnea Son   . Cancer Sister     pancreatic  . Stroke Maternal Grandmother   . Heart attack Maternal Grandfather   . Alcohol abuse Maternal Uncle   . Colon cancer Neg Hx   . Anesthesia problems Neg Hx   . Hypotension Neg Hx   . Malignant hyperthermia Neg Hx   . Pseudochol deficiency Neg Hx     Past Surgical History:  Procedure Laterality Date  . ABDOMINAL HYSTERECTOMY  1978  . BACK SURGERY  July 2012  . BACTERIAL OVERGROWTH TEST N/A 05/05/2013   Procedure: BACTERIAL OVERGROWTH TEST;  Surgeon: Daneil Dolin, MD;  [facility];  Service: Endoscopy;  Laterality: N/A;  7:30  . BIOPSY  THYROID  2009  . BRAIN SURGERY  11/2011   resection of meningioma  . BREAST REDUCTION SURGERY  1994  . CARDIAC CATHETERIZATION  05/10/2005   normal coronaries, normal LV systolic function and EF (Dr. Jackie Plum)  . CARPAL TUNNEL RELEASE Left 07/22/04   Dr. Aline Brochure  . CATARACT EXTRACTION Bilateral   . CHOLECYSTECTOMY  1984  . COLONOSCOPY N/A 09/25/2012   [id number] diverticulosis.  colonic polyp-removed : tubular adenoma  . CRANIOTOMY  11/23/2011   Procedure: CRANIOTOMY TUMOR EXCISION;  Surgeon: Hosie Spangle, MD;  Location: [place] [facility];  Service: Neurosurgery;  Laterality: N/A;  Craniotomy for tumor resection  . ESOPHAGOGASTRODUODENOSCOPY  12/29/2010   [place]-Retained food in the esophagus and stomach, small hiatal hernia, status post Maloney dilation of the esophagus  . ESOPHAGOGASTRODUODENOSCOPY N/A 09/25/2012   UTM:[id number] atonic baggy esophagus status post Maloney dilation 10 F. Hiatal hernia  . GIVENS CAPSULE STUDY N/A 01/15/2013   NORMAL.   . IR GENERIC HISTORICAL  03/17/2016   IR RADIOLOGIST EVAL & MGMT 03/17/2016 [facility]-INTERV RAD  . LESION REMOVAL N/A 05/31/2015   Procedure: REMOVAL RIGHT AND LEFT LESIONS OF MANDIBLE;  Surgeon: Diona Browner, DDS;  Location: [facility];  Service: Oral Surgery;  Laterality: N/A;  . MALONEY DILATION  12/29/2010   RMR;  . NM MYOCAR PERF WALL MOTION  2006   "relavtiely normal" persantine, mild anterior thinning (breast attenuation artifact), no region of scar/ischemia  . OVARIAN CYST REMOVAL    . RECTOCELE REPAIR N/A 06/29/2015   Procedure: POSTERIOR REPAIR (RECTOCELE);  Surgeon: Jonnie Kind, MD;  Location: [facility];  Service: Gynecology;  Laterality: N/A;  . SPINE SURGERY  09/29/2010   Dr. Rolena Infante  . surgical excision of 3 tumors from right thigh and right buttock  and left upper thigh  2010  . TOOTH EXTRACTION Bilateral 12/14/2014   Procedure: REMOVAL OF BILATERAL MANDIBULAR EXOSTOSES;  Surgeon: Diona Browner, DDS;  Location: [facility];  Service: Oral Surgery;   Laterality: Bilateral;  . TRANSTHORACIC ECHOCARDIOGRAM  2010   EF 60-65%, mild conc LVH, grade 1 diastolic dysfunction; mildly calcified MV annulus with mildly thickened leaflets, mildly calcified MR annulus   Social History  Occupational History  . disabled     Social History Main Topics  . Smoking status: Light Tobacco Smoker    Packs/day: 0.25    Years: 7.00    Types: Cigarettes  . Smokeless tobacco: Never Used     Comment: continues to smoke 1/4 pack a day   . Alcohol use No     Comment:    . Drug use: No  . Sexual activity: No

## 2016-06-19 ENCOUNTER — Ambulatory Visit (INDEPENDENT_AMBULATORY_CARE_PROVIDER_SITE_OTHER): Payer: Commercial Managed Care - HMO | Admitting: Psychiatry

## 2016-06-19 ENCOUNTER — Other Ambulatory Visit: Payer: Self-pay | Admitting: Internal Medicine

## 2016-06-19 ENCOUNTER — Other Ambulatory Visit (HOSPITAL_COMMUNITY): Payer: Self-pay | Admitting: Psychiatry

## 2016-06-19 ENCOUNTER — Encounter (HOSPITAL_COMMUNITY): Payer: Self-pay | Admitting: Psychiatry

## 2016-06-19 ENCOUNTER — Encounter (HOSPITAL_COMMUNITY): Payer: Self-pay

## 2016-06-19 ENCOUNTER — Telehealth (HOSPITAL_COMMUNITY): Payer: Self-pay | Admitting: *Deleted

## 2016-06-19 DIAGNOSIS — M6281 Muscle weakness (generalized): Secondary | ICD-10-CM | POA: Diagnosis not present

## 2016-06-19 DIAGNOSIS — I69393 Ataxia following cerebral infarction: Secondary | ICD-10-CM | POA: Diagnosis not present

## 2016-06-19 DIAGNOSIS — J449 Chronic obstructive pulmonary disease, unspecified: Secondary | ICD-10-CM | POA: Diagnosis not present

## 2016-06-19 DIAGNOSIS — I509 Heart failure, unspecified: Secondary | ICD-10-CM | POA: Diagnosis not present

## 2016-06-19 DIAGNOSIS — I110 Hypertensive heart disease with heart failure: Secondary | ICD-10-CM | POA: Diagnosis not present

## 2016-06-19 DIAGNOSIS — E119 Type 2 diabetes mellitus without complications: Secondary | ICD-10-CM | POA: Diagnosis not present

## 2016-06-19 DIAGNOSIS — F250 Schizoaffective disorder, bipolar type: Secondary | ICD-10-CM | POA: Diagnosis not present

## 2016-06-19 DIAGNOSIS — M545 Low back pain: Secondary | ICD-10-CM | POA: Diagnosis not present

## 2016-06-19 DIAGNOSIS — F331 Major depressive disorder, recurrent, moderate: Secondary | ICD-10-CM

## 2016-06-19 DIAGNOSIS — F17210 Nicotine dependence, cigarettes, uncomplicated: Secondary | ICD-10-CM | POA: Diagnosis not present

## 2016-06-19 NOTE — Telephone Encounter (Signed)
Called pt to inform with what provider stated. Informed pt that RMA will call her pharmacy to get more information as to why she is out of her medications. Called pt pharmacy ([facility]) and spoke with Olivia Mackie. Per Olivia Mackie (pharmacy tech), per pt chart with them pt was last shipped 3 months supply of her Trazodone, Risperdal and Zoloft was 02-24-2016 and pt do not have any refills for any of her medications on file with them. Called pt back and informed her with this information and informed her a new message will be put back to provider and pt verbalized understanding.

## 2016-06-19 NOTE — Telephone Encounter (Signed)
Pt came by office to see another provider. Per pt, she is out of her Zoloft, Trazodone, Risperdal and Ativan. Per pt she wants provider to not send it to local pharmacy and wants provider to send it to [facility] (mail order). Per pt chart, pt Zoloft, Risperdal and Trazodone  was last filled on 02-21-2016 with 1 month supply 3 refills. Pt Ativan last printed on 02-21-16 with 1 month supply 2 refills. Pt f/u appt is 06-21-2016. Pt phone number is [phone].

## 2016-06-19 NOTE — Progress Notes (Signed)
  Therapist and patient decided to reschedule appointment as patient was very lethargic, drowsy, and could not fully participate in session. She reports experiencing increased symptoms of narcolepsy.   Patient agreed to keep 06/21/2016 appointment with psychiatrist Dr. Harrington Challenger. She agreed to reschedule appointment with therapist when she arrives for that appointment.  [place], [place] 06/19/2016

## 2016-06-19 NOTE — Telephone Encounter (Signed)
noted 

## 2016-06-19 NOTE — Telephone Encounter (Signed)
She should have had enough with 3 refills

## 2016-06-19 NOTE — Telephone Encounter (Signed)
patient is here to see Peggy.   She said she is out of her medicines.  Trazodone, risperiDONE (RISPERDAL) 0.5 MG tablet, LORazepam (ATIVAN) 0.5 MG tablet and Zoloft.

## 2016-06-20 ENCOUNTER — Telehealth (HOSPITAL_COMMUNITY): Payer: Self-pay | Admitting: *Deleted

## 2016-06-20 NOTE — Telephone Encounter (Signed)
Called pt due to previous message about refills for her medications. Unable to reach pt and lmtcb

## 2016-06-20 NOTE — Telephone Encounter (Signed)
She is coming in tomorrow and we will settle it then

## 2016-06-20 NOTE — Telephone Encounter (Signed)
phone call from patient, said she is returning [place] call.

## 2016-06-21 ENCOUNTER — Ambulatory Visit (INDEPENDENT_AMBULATORY_CARE_PROVIDER_SITE_OTHER): Payer: Commercial Managed Care - HMO | Admitting: Psychiatry

## 2016-06-21 ENCOUNTER — Encounter (HOSPITAL_COMMUNITY): Payer: Self-pay | Admitting: Psychiatry

## 2016-06-21 VITALS — BP 139/80 | HR 63 | Ht 59.0 in | Wt 147.0 lb

## 2016-06-21 DIAGNOSIS — F331 Major depressive disorder, recurrent, moderate: Secondary | ICD-10-CM

## 2016-06-21 DIAGNOSIS — Z811 Family history of alcohol abuse and dependence: Secondary | ICD-10-CM | POA: Diagnosis not present

## 2016-06-21 DIAGNOSIS — Z79899 Other long term (current) drug therapy: Secondary | ICD-10-CM

## 2016-06-21 MED ORDER — SERTRALINE HCL 100 MG PO TABS: 200.0000 mg | ORAL_TABLET | Freq: Every day | ORAL | 2 refills | Status: DC

## 2016-06-21 MED ORDER — TRAZODONE HCL 150 MG PO TABS: 150.0000 mg | ORAL_TABLET | Freq: Every day | ORAL | 2 refills | Status: DC

## 2016-06-21 MED ORDER — RISPERIDONE 0.5 MG PO TABS: 0.5000 mg | ORAL_TABLET | Freq: Every day | ORAL | 2 refills | Status: DC

## 2016-06-21 MED ORDER — LORAZEPAM 0.5 MG PO TABS: 0.5000 mg | ORAL_TABLET | Freq: Three times a day (TID) | ORAL | 2 refills | Status: DC
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
## 2016-06-21 NOTE — Telephone Encounter (Signed)
Called pt and staff spoke with pt. Staff asked pt if she received staff message and pt stated she did not. Informed pt with provider's message and pt verbalized understanding.

## 2016-06-26 ENCOUNTER — Telehealth: Payer: Self-pay | Admitting: Internal Medicine

## 2016-06-26 NOTE — Telephone Encounter (Signed)
Patient called the [facility] she need a partial fill for insulin [place]

## 2016-06-27 ENCOUNTER — Other Ambulatory Visit (HOSPITAL_COMMUNITY): Payer: Self-pay | Admitting: Psychiatry

## 2016-06-27 ENCOUNTER — Telehealth (INDEPENDENT_AMBULATORY_CARE_PROVIDER_SITE_OTHER): Payer: Self-pay | Admitting: *Deleted

## 2016-06-27 MED ORDER — INSULIN GLARGINE 300 UNIT/ML ~~LOC~~ SOPN: 25.0000 [IU] | PEN_INJECTOR | Freq: Every day | SUBCUTANEOUS | 3 refills | Status: DC

## 2016-06-27 NOTE — Telephone Encounter (Signed)
Rx for toujeo submitted to [facility].

## 2016-06-27 NOTE — Telephone Encounter (Signed)
Patient called in this morning regards to needing to know what to do about her toe? She stated it has a knot that came up on it and it hurts pretty bad. She was seen last week by Junie Panning I suggested she come back in and have [name] take a look at it but she stated she has issues with transportation. Her CB # [phone]. Thank you

## 2016-06-28 ENCOUNTER — Telehealth: Payer: Self-pay | Admitting: *Deleted

## 2016-06-28 NOTE — Telephone Encounter (Signed)
Pt states she is having a problem with the top of her toe and felt like there was a knot. I spoke with pt and offered an appt and she states she just spoke with someone from Dr. Jess Barters office and he wanted to see it, and she has an appt Friday.

## 2016-06-28 NOTE — Telephone Encounter (Signed)
Being that she has had ulcer on this toe in past no way to advise what is causing the pain without evaluation. Please call pt and make appt.

## 2016-06-28 NOTE — Telephone Encounter (Signed)
I just called pt and she states that some one has sch her for this Friday.

## 2016-06-29 DIAGNOSIS — E119 Type 2 diabetes mellitus without complications: Secondary | ICD-10-CM | POA: Diagnosis not present

## 2016-06-29 DIAGNOSIS — I110 Hypertensive heart disease with heart failure: Secondary | ICD-10-CM | POA: Diagnosis not present

## 2016-06-29 DIAGNOSIS — M545 Low back pain: Secondary | ICD-10-CM | POA: Diagnosis not present

## 2016-06-29 DIAGNOSIS — F250 Schizoaffective disorder, bipolar type: Secondary | ICD-10-CM | POA: Diagnosis not present

## 2016-06-29 DIAGNOSIS — F17210 Nicotine dependence, cigarettes, uncomplicated: Secondary | ICD-10-CM | POA: Diagnosis not present

## 2016-06-29 DIAGNOSIS — I509 Heart failure, unspecified: Secondary | ICD-10-CM | POA: Diagnosis not present

## 2016-06-29 DIAGNOSIS — I69393 Ataxia following cerebral infarction: Secondary | ICD-10-CM | POA: Diagnosis not present

## 2016-06-29 DIAGNOSIS — J449 Chronic obstructive pulmonary disease, unspecified: Secondary | ICD-10-CM | POA: Diagnosis not present

## 2016-06-29 DIAGNOSIS — M6281 Muscle weakness (generalized): Secondary | ICD-10-CM | POA: Diagnosis not present

## 2016-06-30 ENCOUNTER — Ambulatory Visit (INDEPENDENT_AMBULATORY_CARE_PROVIDER_SITE_OTHER): Payer: Medicare HMO | Admitting: Orthopedic Surgery

## 2016-06-30 ENCOUNTER — Encounter (INDEPENDENT_AMBULATORY_CARE_PROVIDER_SITE_OTHER): Payer: Self-pay | Admitting: Orthopedic Surgery

## 2016-06-30 ENCOUNTER — Ambulatory Visit (INDEPENDENT_AMBULATORY_CARE_PROVIDER_SITE_OTHER): Payer: Medicare HMO

## 2016-06-30 VITALS — Ht 59.0 in | Wt 147.0 lb

## 2016-06-30 DIAGNOSIS — M79672 Pain in left foot: Secondary | ICD-10-CM

## 2016-06-30 NOTE — Progress Notes (Signed)
Office Visit Note   Patient: [name]           Date of Birth: [birth year]           MRN: [id number] Visit Date: 06/30/2016              Requested by: [name], MD [address], [place], [place] [address] PCP: Tula Nakayama, MD  Chief Complaint  Patient presents with  . Left Foot - Follow-up      HPI: Patient is a 66 year old woman who presents stating that she is having some second toe pain dorsally. She is status post toenail removal. She is complains of circumferential pain around the second toe. She denies any swelling denies any ulceration currently ambulates with a cane. She has been using lidocaine topically.  Assessment & Plan: Visit Diagnoses:  1. Pain in left foot     Plan: Recommending continue with her postop shoe to unload pressure from the forefoot. Patient will follow up as needed if she develops any redness swelling cellulitis or recurrent ulceration. Recommended using anti-inflammatories and not using lidocaine or her foot pain  Follow-Up Instructions: Return if symptoms worsen or fail to improve.   Ortho Exam  Patient is alert, oriented, no adenopathy, well-dressed, normal affect, normal respiratory effort. Patient has an antalgic gait. Examination she has a good dorsalis pedis pulse she has good ankle subtalar motion. Dorsiflexion past neutral no heel cord contracture she has no tenderness to palpation across the midfoot or forefoot. She does have some tenderness to palpation around the second toe there is no swelling no ulceration no signs of infection.  Imaging: Xr Foot Complete Left  Result Date: 06/30/2016 Three-view radiographs of the left foot shows no bony abnormalities no evidence of osteomyelitis. There is no joint space subluxation or dislocation. There is no Charcot arthropathy.   Labs: Lab Results  Component Value Date   HGBA1C 6.3 02/02/2016   HGBA1C 6.6 11/01/2015   HGBA1C 6.2 (H) 05/31/2015   ESRSEDRATE 20  02/05/2008   LABURIC 5.1 04/23/2009   REPTSTATUS 06/12/2012 FINAL 06/11/2012   CULT NO GROWTH 06/11/2012   LABORGA GROUP B STREP (S.AGALACTIAE) ISOLATED 05/10/2015    Orders:  Orders Placed This Encounter  Procedures  . XR Foot Complete Left   No orders of the defined types were placed in this encounter.    Procedures: No procedures performed  Clinical Data: No additional findings.  ROS:  All other systems negative, except as noted in the HPI. Review of Systems  Objective: Vital Signs: Ht 4\' 11"  (1.499 m)   Wt 147 lb (66.7 kg)   BMI 29.69 kg/m   Specialty Comments:  No specialty comments available.  PMFS History: Patient Active Problem List   Diagnosis Date Noted  . Diabetic polyneuropathy associated with type 2 diabetes mellitus ([place]) 05/19/2016  . Smoker 04/24/2016  . Class 1 obesity with serious comorbidity and body mass index (BMI) of 33.0 to 33.9 in adult 04/24/2016  . Ulcer of toe, left, limited to breakdown of skin ([place]) 04/18/2016  . History of palpitations 08/09/2015  . Nausea without vomiting 08/09/2015  . Labile hypertension 08/03/2015  . Normal coronary arteries 08/03/2015  . Left-sided low back pain with left-sided sciatica 06/27/2015  . Left flank pain 06/27/2015  . Acute cystitis without hematuria 05/10/2015  . Poorly controlled type 2 diabetes mellitus with circulatory disorder ([place]) 05/06/2015  . Multinodular goiter 05/06/2015  . Rectocele, female 04/27/2015  . Anal sphincter  incontinence 04/27/2015  . Pelvic relaxation due to rectocele 03/30/2015  . Pulmonary hypertension ([place]) 02/22/2015  . Weakness of left upper extremity 02/21/2015  . Light cigarette smoker (1-9 cigarettes per day) 01/11/2015  . Migraine without aura and without status migrainosus, not intractable 07/02/2014  . Flatulence 02/18/2014  . Microcytic anemia 02/18/2014  . COPD (chronic obstructive pulmonary disease) with chronic bronchitis ([place]) 09/16/2013  . Hypothyroidism  08/16/2013  . Gastroparesis 04/28/2013  . Seizure disorder ([place]) 01/19/2013  . Cervical disc disorder with radiculopathy of cervical region 10/31/2012  . Solitary pulmonary nodule 08/19/2012  . Anemia 07/05/2012  . 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 Allergic sinusitis 04/18/2012  . Meningioma ([place]) 11/19/2011  . 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 Hemiplegia affecting non-dominant side, post-stroke 08/02/2011  . Carpal tunnel syndrome of right wrist 05/23/2011  . Polypharmacy 04/28/2011  . Bipolar disorder ([place]) 04/28/2011  . Constipation 04/13/2011  . Falls frequently 12/12/2010  . Urinary incontinence 12/16/2009  . HEARING LOSS 10/26/2009  . Hyperlipidemia 12/11/2008  . IBS 12/11/2008  . GERD 07/29/2008  . MILK PRODUCTS ALLERGY 07/29/2008  . 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 Backache 06/19/2007  . Osteoporosis 06/19/2007  . Obstructive sleep apnea 06/19/2007  . TRIGGER FINGER 04/18/2007  . DIVERTICULOSIS, COLON 11/13/2006   Past Medical History:  Diagnosis Date  . Anemia   . Anxiety    takes Ativan daily  . Arthritis   . Bipolar disorder ([place])    takes Risperdal nightly  . Blood transfusion   . Cancer ([place])    In her gum  . Carpal tunnel syndrome of right wrist 05/23/2011  . Cervical disc disorder with radiculopathy of cervical region 10/31/2012  . Chronic back pain   . Chronic idiopathic constipation   . Colon polyps   . COPD (chronic obstructive pulmonary disease) with chronic bronchitis ([place]) 09/16/2013   Office Spirometry 10/30/2013-submaximal effort based on appearance of loop and curve. Numbers would fit with severe restriction but her physiologic capability may be better than this. FVC 0.91/44%, and 10.74/45%, FEV1/FVC 0.81, FEF 25-75% 1.43/69%    . Depression    takes Zoloft daily  . Diabetes mellitus    Type II  . Diverticulosis    TCS 9/08 by Dr. Delfin Edis for diarrhea . Bx for micro  scopic colitis negative.   . Fibromyalgia   . Frequent falls   . GERD (gastroesophageal reflux disease)    takes Aciphex daily  . Glaucoma    eye drops daily  . Gum symptoms    infection on antibiotic  . Hemiplegia affecting non-dominant side, post-stroke 08/02/2011  . Hyperlipidemia    takes Crestor daily  . Hypertension    takes Amlodipine,Metoprolol,and Clonidine daily  . Hypothyroidism    takes Synthroid daily  . IBS (irritable bowel syndrome)   . Insomnia    takes Trazodone nightly  . Metabolic encephalopathy 0/25/8527  . Migraines    chronic headaches  . Mononeuritis lower limb   . Osteoporosis   . Pancreatitis 2006   due to Depakote with normal EUS   . Schatzki's ring    non critical / EGD with ED 8/2011with RMR  . Seizures ([place])    takes Lamictal daily.Last seizure 3 yrs ago  . Sleep apnea    on CPAP  . Stroke [facility])    left sided weakness  . Tubular adenoma of colon     Family History  Problem Relation Age  of Onset  . Heart attack Mother     HTN  . Pneumonia Father   . Kidney failure Father   . Diabetes Father   . Pancreatic cancer Sister   . Diabetes Brother   . Hypertension Brother   . Diabetes Brother   . Cancer Sister     breast   . Hypertension Son   . Sleep apnea Son   . Cancer Sister     pancreatic  . Stroke Maternal Grandmother   . Heart attack Maternal Grandfather   . Alcohol abuse Maternal Uncle   . Colon cancer Neg Hx   . Anesthesia problems Neg Hx   . Hypotension Neg Hx   . Malignant hyperthermia Neg Hx   . Pseudochol deficiency Neg Hx     Past Surgical History:  Procedure Laterality Date  . ABDOMINAL HYSTERECTOMY  1978  . BACK SURGERY  July 2012  . BACTERIAL OVERGROWTH TEST N/A 05/05/2013   Procedure: BACTERIAL OVERGROWTH TEST;  Surgeon: Daneil Dolin, MD;  Location: [facility];  Service: Endoscopy;  Laterality: N/A;  7:30  . BIOPSY THYROID  2009  . BRAIN SURGERY  11/2011   resection of meningioma  . BREAST REDUCTION  SURGERY  1994  . CARDIAC CATHETERIZATION  05/10/2005   normal coronaries, normal LV systolic function and EF (Dr. Jackie Plum)  . CARPAL TUNNEL RELEASE Left 07/22/04   Dr. Aline Brochure  . CATARACT EXTRACTION Bilateral   . CHOLECYSTECTOMY  1984  . COLONOSCOPY N/A 09/25/2012   QJJ:HERDEYC diverticulosis.  colonic polyp-removed : tubular adenoma  . CRANIOTOMY  11/23/2011   Procedure: CRANIOTOMY TUMOR EXCISION;  Surgeon: Hosie Spangle, MD;  Location: [facility];  Service: Neurosurgery;  Laterality: N/A;  Craniotomy for tumor resection  . ESOPHAGOGASTRODUODENOSCOPY  12/29/2010   [place]-Retained food in the esophagus and stomach, small hiatal hernia, status post Maloney dilation of the esophagus  . ESOPHAGOGASTRODUODENOSCOPY N/A 09/25/2012   XKG:YJEHUDJS atonic baggy esophagus status post Maloney dilation 9 F. Hiatal hernia  . GIVENS CAPSULE STUDY N/A 01/15/2013   NORMAL.   . IR GENERIC HISTORICAL  03/17/2016   IR RADIOLOGIST EVAL & MGMT 03/17/2016 [facility]  . LESION REMOVAL N/A 05/31/2015   Procedure: REMOVAL RIGHT AND LEFT LESIONS OF MANDIBLE;  Surgeon: Diona Browner, DDS;  Location: [facility];  Service: Oral Surgery;  Laterality: N/A;  . MALONEY DILATION  12/29/2010   RMR;  . NM MYOCAR PERF WALL MOTION  2006   "relavtiely normal" persantine, mild anterior thinning (breast attenuation artifact), no region of scar/ischemia  . OVARIAN CYST REMOVAL    . RECTOCELE REPAIR N/A 06/29/2015   Procedure: POSTERIOR REPAIR (RECTOCELE);  Surgeon: Jonnie Kind, MD;  Location: [facility];  Service: Gynecology;  Laterality: N/A;  . SPINE SURGERY  09/29/2010   Dr. Rolena Infante  . surgical excision of 3 tumors from right thigh and right buttock  and left upper thigh  2010  . TOOTH EXTRACTION Bilateral 12/14/2014   Procedure: REMOVAL OF BILATERAL MANDIBULAR EXOSTOSES;  Surgeon: Diona Browner, DDS;  Location: [facility];  Service: Oral Surgery;  Laterality: Bilateral;  . TRANSTHORACIC ECHOCARDIOGRAM  2010   EF 60-65%, mild conc LVH,  grade 1 diastolic dysfunction; mildly calcified MV annulus with mildly thickened leaflets, mildly calcified MR annulus   Social History   Occupational History  . disabled     Social History Main Topics  . Smoking status: Light Tobacco Smoker    Packs/day: 0.25    Years: 7.00  Types: Cigarettes  . Smokeless tobacco: Never Used     Comment: continues to smoke 1/4 pack a day   . Alcohol use No     Comment:    . Drug use: No  . Sexual activity: No

## 2016-07-01 DIAGNOSIS — I639 Cerebral infarction, unspecified: Secondary | ICD-10-CM | POA: Diagnosis not present

## 2016-07-01 DIAGNOSIS — M150 Primary generalized (osteo)arthritis: Secondary | ICD-10-CM | POA: Diagnosis not present

## 2016-07-01 DIAGNOSIS — E119 Type 2 diabetes mellitus without complications: Secondary | ICD-10-CM | POA: Diagnosis not present

## 2016-07-01 DIAGNOSIS — R32 Unspecified urinary incontinence: Secondary | ICD-10-CM | POA: Diagnosis not present

## 2016-07-03 ENCOUNTER — Other Ambulatory Visit: Payer: Self-pay | Admitting: Family Medicine

## 2016-07-03 ENCOUNTER — Ambulatory Visit (INDEPENDENT_AMBULATORY_CARE_PROVIDER_SITE_OTHER): Payer: Medicare HMO | Admitting: Orthopedic Surgery

## 2016-07-03 ENCOUNTER — Telehealth: Payer: Self-pay | Admitting: Family Medicine

## 2016-07-03 DIAGNOSIS — I69393 Ataxia following cerebral infarction: Secondary | ICD-10-CM | POA: Diagnosis not present

## 2016-07-03 DIAGNOSIS — F17210 Nicotine dependence, cigarettes, uncomplicated: Secondary | ICD-10-CM | POA: Diagnosis not present

## 2016-07-03 DIAGNOSIS — M6281 Muscle weakness (generalized): Secondary | ICD-10-CM | POA: Diagnosis not present

## 2016-07-03 DIAGNOSIS — J449 Chronic obstructive pulmonary disease, unspecified: Secondary | ICD-10-CM | POA: Diagnosis not present

## 2016-07-03 DIAGNOSIS — M5441 Lumbago with sciatica, right side: Secondary | ICD-10-CM | POA: Diagnosis not present

## 2016-07-03 DIAGNOSIS — E119 Type 2 diabetes mellitus without complications: Secondary | ICD-10-CM | POA: Diagnosis not present

## 2016-07-03 DIAGNOSIS — I509 Heart failure, unspecified: Secondary | ICD-10-CM | POA: Diagnosis not present

## 2016-07-03 DIAGNOSIS — I110 Hypertensive heart disease with heart failure: Secondary | ICD-10-CM | POA: Diagnosis not present

## 2016-07-03 DIAGNOSIS — M545 Low back pain: Secondary | ICD-10-CM | POA: Diagnosis not present

## 2016-07-03 DIAGNOSIS — F250 Schizoaffective disorder, bipolar type: Secondary | ICD-10-CM | POA: Diagnosis not present

## 2016-07-03 NOTE — Telephone Encounter (Signed)
Patient is requesting to speak to brandi, she says she lost some medical papers, she would not specify anything other than requesting dates. She did not want me to help her, she told me I was nosey for asking.   cb#: [phone]

## 2016-07-05 NOTE — Telephone Encounter (Signed)
Spoke with patient and she will bring papers by at her next visit

## 2016-07-06 DIAGNOSIS — M79609 Pain in unspecified limb: Secondary | ICD-10-CM | POA: Diagnosis not present

## 2016-07-07 ENCOUNTER — Ambulatory Visit (INDEPENDENT_AMBULATORY_CARE_PROVIDER_SITE_OTHER): Payer: Commercial Managed Care - HMO | Admitting: Internal Medicine

## 2016-07-07 ENCOUNTER — Encounter: Payer: Self-pay | Admitting: Internal Medicine

## 2016-07-07 VITALS — BP 152/62 | HR 75 | Resp 16 | Ht 59.0 in | Wt 156.4 lb

## 2016-07-07 DIAGNOSIS — E039 Hypothyroidism, unspecified: Secondary | ICD-10-CM

## 2016-07-07 DIAGNOSIS — E1165 Type 2 diabetes mellitus with hyperglycemia: Secondary | ICD-10-CM

## 2016-07-07 DIAGNOSIS — E1159 Type 2 diabetes mellitus with other circulatory complications: Secondary | ICD-10-CM

## 2016-07-07 DIAGNOSIS — Z794 Long term (current) use of insulin: Secondary | ICD-10-CM | POA: Diagnosis not present

## 2016-07-07 DIAGNOSIS — E042 Nontoxic multinodular goiter: Secondary | ICD-10-CM | POA: Diagnosis not present

## 2016-07-07 LAB — POCT GLYCOSYLATED HEMOGLOBIN (HGB A1C): Hemoglobin A1C: 6.6

## 2016-07-07 NOTE — Progress Notes (Signed)
Patient ID: [name], female   DOB: Aug 07, 1950, 66 y.o.   MRN: [id number]  HPI: [name] BERNALES is a 66 y.o.-year-old female, initially referred by her PCP, [name], for management of DM2, dx 1995, insulin-dependent since 1997, uncontrolled, with complications (gastroparesis, cerebrovasc. Ds-  H/o stroke, PN),  Hypothyroidism, thyroid nodules.  Last visit 5 mo ago.  She had an infected L 2nd toe since last visit (04/2016) >> saw Dr. Sharol Given >> no need for amputation. Her L foot is in a boot.  DM2: Last hemoglobin A1c was: Lab Results  Component Value Date   HGBA1C 6.3 02/02/2016   HGBA1C 6.6 11/01/2015   HGBA1C 6.2 (H) 05/31/2015  10/28/2013: HbA1c 8.5%  She is now on: - Toujeo 25 >> 30 units at bedtime - Novolog:  17 units with a smaller meal  24 units with a larger meal or with lunch >> only uses it when eating out Could not tolerate Metformin >> nausea.   Pt checks her sugars 4x a day and they are: - am: \100-190, 201 >> 85-135, 140 >> 64-160, 218, 316 >> 44, 58, 74-140, 172, 212 - 2h after b'fast: n/c  - before lunch: 44x1, 82-162, 201 >> 98-130, 148 >> 63-140, 160, 297 >>70, 82-149, 155 - 2h after lunch: n/c >> 127-154, 229 >> n/c - before dinner:   81-179, 287 >> 76, 89-110, 178 >> 66-160, 205, 229 >> 44, 72, 99-192, 210, 230, 256, 525  - 2h after dinner: n/c - bedtime:  57, 90-302 >> 100-160 >> 79, 120-180, 201, 301 >> 121-190, 386 - nighttime: n/c No lows. Lowest sugar was 44x1 >> 76 >> 63 >> 44; she has hypoglycemia awareness at 70.  Highest sugar was 332 >> 319 >> 500s (infection).  Pt's meals are: - Breakfast: eggs, cereals, fruit, oatmeal, bacon - Lunch: sandwich, beef, mashed potatoes,diet soda - Dinner: chicken, beef, fish, potatoes, vegetables - Snacks: 1-2: fruit  - + mild CKD, last BUN/creatinine:  Lab Results  Component Value Date   BUN 12 05/14/2016   CREATININE 0.67 05/14/2016  On Losartan. - last set of lipids: Lab Results  Component Value Date    CHOL 158 04/18/2016   HDL 44 04/18/2016   LDLCALC 75 04/18/2016   TRIG 195 (H) 04/18/2016   CHOLHDL 3.1 07/19/2015  On Ezetimibe + Crestor. - last eye exam was in 09/2015. No DR. Had cataract sx B. Now seeing Dr. Hassell Done [facility]). - + numbness and tingling in her leg L leg (affected by stroke) but also in R. Sees podiatry.  Hypothyroidism: - dx > 10 years ago. - On Levoxyl 50 mcg 6/7 days and 25 1/7 days: - in am - fasting - at least 30 min from b'fast - no Ca, Fe - not on Biotin - + MVI at noon - + Aciphex moved before b'fast!!!  LatestTSH levels norma: Lab Results  Component Value Date   TSH 0.906 01/22/2016   TSH 1.030 11/03/2015   TSH 1.207 02/21/2015   TSH 1.352 11/09/2014   TSH 0.28 (L) 09/21/2014   She has a h/o thyroid nodules. + dysphagia with certain foods, + hoarseness.  reviewed thyroid U/S (10/2013):  Right thyroid lobe: 3.7 x 1.0 x 1.8 cm   Left thyroid lobe: 3.5 x 1.5 x 1.6 cm   Isthmus: 0.5 cm   Focal nodules: There is a 0.3 mm calcified nodule in the right midzone. There is a 0.6 x 0.7 x 0.5 cm hypoechoic nodule in the  lateral aspect of the right midzone. There is a 0.7 x 0.6 x 0.5 cm hyperechoic nodule in the lateral aspect of the right lower pole.  There is a 2.2 x 1.1 x 1.7 cm complex solid nodule in the inferior aspect of the isthmus extending adjacent to the left lower pole.  There is a 1.4 x 2.1 x 1.4 cm complex solid nodule in the left lower pole.   Lymphadenopathy: None visualized.  Repeat U/S (01/2014): stable appearance of nodules  FNA (2014) x2: benign  Repeat U/S (10/2015): stable appearance of nodules  She had a L rib fracture in 12/2014.  ROS: Constitutional: no weight gain/no weight loss, no fatigue, no subjective hyperthermia, + subjective hypothermia Eyes: + blurry vision, no xerophthalmia ENT: no sore throat, no nodules palpated in throat, no dysphagia, no odynophagia, no hoarseness Cardiovascular: no CP/no SOB/no  palpitations/no leg swelling Respiratory: no cough/no SOB/no wheezing Gastrointestinal: + N/no V/no D/no C/no acid reflux Musculoskeletal: no muscle aches/no joint aches Skin: no rashes, no hair loss Neurological: no tremors/no numbness/no tingling/no dizziness, + HA  I reviewed pt's medications, allergies, PMH, social hx, family hx, and changes were documented in the history of present illness. Otherwise, unchanged from my initial visit note - except stopped Remeron, started Risperdal: Past Medical History:  Diagnosis Date  . Anemia   . Anxiety    takes Ativan daily  . Arthritis   . Bipolar disorder ([place])    takes Risperdal nightly  . Blood transfusion   . Cancer ([place])    In her gum  . Carpal tunnel syndrome of right wrist 05/23/2011  . Cervical disc disorder with radiculopathy of cervical region 10/31/2012  . Chronic back pain   . Chronic idiopathic constipation   . Colon polyps   . COPD (chronic obstructive pulmonary disease) with chronic bronchitis ([place]) 09/16/2013   Office Spirometry 10/30/2013-submaximal effort based on appearance of loop and curve. Numbers would fit with severe restriction but her physiologic capability may be better than this. FVC 0.91/44%, and 10.74/45%, FEV1/FVC 0.81, FEF 25-75% 1.43/69%    . Depression    takes Zoloft daily  . Diabetes mellitus    Type II  . Diverticulosis    TCS 9/08 by Dr. Delfin Edis for diarrhea . Bx for micro scopic colitis negative.   . Fibromyalgia   . Frequent falls   . GERD (gastroesophageal reflux disease)    takes Aciphex daily  . Glaucoma    eye drops daily  . Gum symptoms    infection on antibiotic  . Hemiplegia affecting non-dominant side, post-stroke 08/02/2011  . Hyperlipidemia    takes Crestor daily  . Hypertension    takes Amlodipine,Metoprolol,and Clonidine daily  . Hypothyroidism    takes Synthroid daily  . IBS (irritable bowel syndrome)   . Insomnia    takes Trazodone nightly  . Metabolic encephalopathy  12/04/2681  . Migraines    chronic headaches  . Mononeuritis lower limb   . Osteoporosis   . Pancreatitis 2006   due to Depakote with normal EUS   . Schatzki's ring    non critical / EGD with ED 8/2011with RMR  . Seizures ([place])    takes Lamictal daily.Last seizure 3 yrs ago  . Sleep apnea    on CPAP  . Stroke [facility])    left sided weakness  . Tubular adenoma of colon    Past Surgical History:  Procedure Laterality Date  . ABDOMINAL HYSTERECTOMY  1978  . BACK SURGERY  July  2012  . BACTERIAL OVERGROWTH TEST N/A 05/05/2013   Procedure: BACTERIAL OVERGROWTH TEST;  Surgeon: Daneil Dolin, MD;  [facility];  Service: Endoscopy;  Laterality: N/A;  7:30  . BIOPSY THYROID  2009  . BRAIN SURGERY  11/2011   resection of meningioma  . BREAST REDUCTION SURGERY  1994  . CARDIAC CATHETERIZATION  05/10/2005   normal coronaries, normal LV systolic function and EF (Dr. Jackie Plum)  . CARPAL TUNNEL RELEASE Left 07/22/04   Dr. Aline Brochure  . CATARACT EXTRACTION Bilateral   . CHOLECYSTECTOMY  1984  . COLONOSCOPY N/A 09/25/2012   CWC:BJSEGBT diverticulosis.  colonic polyp-removed : tubular adenoma  . CRANIOTOMY  11/23/2011   Procedure: CRANIOTOMY TUMOR EXCISION;  Surgeon: Hosie Spangle, MD;  Location: [place] NEURO ORS;  Service: Neurosurgery;  Laterality: N/A;  Craniotomy for tumor resection  . ESOPHAGOGASTRODUODENOSCOPY  12/29/2010   [place]k-Retained food in the esophagus and stomach, small hiatal hernia, status post Maloney dilation of the esophagus  . ESOPHAGOGASTRODUODENOSCOPY N/A 09/25/2012   DVV:OHYWVPXT atonic baggy esophagus status post Maloney dilation 10 F. Hiatal hernia  . GIVENS CAPSULE STUDY N/A 01/15/2013   NORMAL.   . IR GENERIC HISTORICAL  03/17/2016   IR RADIOLOGIST EVAL & MGMT 03/17/2016 [facility]-INTERV RAD  . LESION REMOVAL N/A 05/31/2015   Procedure: REMOVAL RIGHT AND LEFT LESIONS OF MANDIBLE;  Surgeon: Diona Browner, DDS;  Location: [place];  Service: Oral Surgery;  Laterality: N/A;  .  MALONEY DILATION  12/29/2010   RMR;  . NM MYOCAR PERF WALL MOTION  2006   "relavtiely normal" persantine, mild anterior thinning (breast attenuation artifact), no region of scar/ischemia  . OVARIAN CYST REMOVAL    . RECTOCELE REPAIR N/A 06/29/2015   Procedure: POSTERIOR REPAIR (RECTOCELE);  Surgeon: Jonnie Kind, MD;  Location: [facility];  Service: Gynecology;  Laterality: N/A;  . SPINE SURGERY  09/29/2010   Dr. Rolena Infante  . surgical excision of 3 tumors from right thigh and right buttock  and left upper thigh  2010  . TOOTH EXTRACTION Bilateral 12/14/2014   Procedure: REMOVAL OF BILATERAL MANDIBULAR EXOSTOSES;  Surgeon: Diona Browner, DDS;  Location: [place];  Service: Oral Surgery;  Laterality: Bilateral;  . TRANSTHORACIC ECHOCARDIOGRAM  2010   EF 60-65%, mild conc LVH, grade 1 diastolic dysfunction; mildly calcified MV annulus with mildly thickened leaflets, mildly calcified MR annulus   History   Social History  . Marital Status: Divorced    Spouse Name: N/A    Number of Children: 1  . Years of Education: 12   Occupational History  . disabled     Social History Main Topics  . Smoking status: Current Every Day Smoker -- 0.25 packs/day for 7 years    Types: Cigarettes  . Smokeless tobacco: Never Used     Comment: "started back but off now for 3 months" (08/18/13)  . Alcohol Use: No     Comment:    . Drug Use: No   Current Outpatient Prescriptions on File Prior to Visit  Medication Sig Dispense Refill  . ACCU-CHEK FASTCLIX LANCETS MISC Use to test blood sugar 4 times daily. Dx: E10.65 408 each 1  . ACCU-CHEK SMARTVIEW test strip TEST BLOOD SUGAR FOUR TIMES DAILY AS DIRECTED 350 each 2  . albuterol (PROVENTIL HFA;VENTOLIN HFA) 108 (90 BASE) MCG/ACT inhaler Inhale 1 puff into the lungs 3 (three) times daily. (Patient taking differently: Inhale 1 puff into the lungs 3 (three) times daily as needed. ) 1 Inhaler 2  .  Alcohol Swabs (B-D SINGLE USE SWABS REGULAR) PADS Use for injections and  glucose testing 8 times daily. Dx: E10.65 900 each 1  . amLODipine (NORVASC) 5 MG tablet TAKE 2 TABLETS BY MOUTH ONCE DAILY. 60 tablet 0  . aspirin EC 81 MG tablet Take 81 mg by mouth daily.    . Azelastine-Fluticasone (DYMISTA) 137-50 MCG/ACT SUSP Place 1 puff into the nose at bedtime. 1 Bottle 0  . BD PEN NEEDLE NANO U/F 32G X 4 MM MISC USE FOUR TIMES DAILY 360 each 3  . Blood Glucose Calibration (ACCU-CHEK SMARTVIEW CONTROL) LIQD 1 each by Other route as needed. 1 each 2  . Blood Glucose Monitoring Suppl (ACCU-CHEK NANO SMARTVIEW) W/DEVICE KIT 1 each by Does not apply route daily. Dx: E10.65 1 kit 0  . Cholecalciferol (VITAMIN D) 2000 units CAPS Take 1 capsule by mouth daily.    . clobetasol cream (TEMOVATE) 4.65 % Apply 1 application topically daily.    . cloNIDine (CATAPRES) 0.3 MG tablet TAKE 1 TABLET BY MOUTH EVERY EIGHT HOURS. ONCE AT 8AM, 4PM, AND 12 MIDNIGHT. 270 tablet 1  . diclofenac sodium (VOLTAREN) 1 % GEL Apply 2 g topically daily as needed (Pain). 100 g 2  . dicyclomine (BENTYL) 10 MG capsule TAKE 1 CAPSULE BY MOUTH THREE TIMES DAILY BEFORE MEALS. 270 capsule 1  . doxycycline (VIBRA-TABS) 100 MG tablet Take 1 tablet (100 mg total) by mouth 2 (two) times daily. 20 tablet 0  . hydroxychloroquine (PLAQUENIL) 200 MG tablet Take 200 mg by mouth daily.    . Insulin Glargine (TOUJEO SOLOSTAR) 300 UNIT/ML SOPN Inject 25 Units into the skin at bedtime. 6 pen 3  . lamoTRIgine (LAMICTAL) 100 MG tablet TAKE 1 TABLET BY MOUTH TWICE DAILY. 60 tablet 6  . levothyroxine (SYNTHROID, LEVOTHROID) 50 MCG tablet TAKE 1 TABLET BY MOUTH ONCE DAILY AND 1/2 TABLET ON SUNDAYS. 30 tablet 0  . LORazepam (ATIVAN) 0.5 MG tablet Take 1 tablet (0.5 mg total) by mouth 3 (three) times daily. 90 tablet 2  . losartan (COZAAR) 50 MG tablet TAKE ONE TABLET BY MOUTH DAILY. 90 tablet 1  . methocarbamol (ROBAXIN) 500 MG tablet Take 500 mg by mouth daily.    . metoprolol (LOPRESSOR) 50 MG tablet TAKE 1 TABLET BY MOUTH  TWICE DAILY. 180 tablet 3  . montelukast (SINGULAIR) 10 MG tablet TAKE ONE TABLET BY MOUTH ONCE DAILY. 90 tablet 1  . Multiple Vitamins-Minerals (ONE-A-DAY 50 PLUS PO) Take 1 tablet by mouth daily.    [name] NOVOLOG FLEXPEN 100 UNIT/ML FlexPen INJECT 16 TO 30 UNITS INTO THE SKIN 3 (THREE) TIMES DAILY WITH MEALS. 90 mL 2  . nystatin (MYCOSTATIN/NYSTOP) powder APPLY TO AFFECTED AREA FOUR TIMES DAILY. 30 g 2  . ondansetron (ZOFRAN) 4 MG tablet Take 1 tablet (4 mg total) by mouth every 8 (eight) hours as needed for nausea or vomiting. 20 tablet 0  . Plecanatide (TRULANCE) 3 MG TABS Take 3 mg by mouth daily. 30 tablet 2  . pregabalin (LYRICA) 75 MG capsule Take 75 mg by mouth 2 (two) times daily.     . RABEprazole (ACIPHEX) 20 MG tablet Take 1 tablet (20 mg total) by mouth 2 (two) times daily. 180 tablet 1  . RESTASIS 0.05 % ophthalmic emulsion Place 1 drop into both eyes 2 (two) times daily.     . risperiDONE (RISPERDAL) 0.5 MG tablet Take 1 tablet (0.5 mg total) by mouth at bedtime. 90 tablet 2  . rosuvastatin (CRESTOR) 5 MG  tablet TAKE 1 TABLET BY MOUTH AT BEDTIME. 90 tablet 1  . sertraline (ZOLOFT) 100 MG tablet Take 2 tablets (200 mg total) by mouth daily. 180 tablet 2  . traZODone (DESYREL) 150 MG tablet Take 1 tablet (150 mg total) by mouth at bedtime. 90 tablet 2  . vitamin B-12 (CYANOCOBALAMIN) 1000 MCG tablet Take 1,000 mcg by mouth daily.    . vitamin E 400 UNIT capsule Take 400 Units by mouth daily.     Current Facility-Administered Medications on File Prior to Visit  Medication Dose Route Frequency Provider Last Rate Last Dose  . 0.9 %  sodium chloride infusion  500 mL Intravenous Continuous Jerene Bears, MD          Allergies  Allergen Reactions  . Cephalexin Hives  . Iron Nausea And Vomiting  . Milk-Related Compounds Other (See Comments)    Doesn't agree with stomach.   . Penicillins Hives       . Phenazopyridine Hcl Hives         Family History  Problem Relation Age of  Onset  . Heart attack Mother     HTN  . Pneumonia Father   . Kidney failure Father   . Diabetes Father   . Pancreatic cancer Sister   . Diabetes Brother   . Hypertension Brother   . Diabetes Brother   . Cancer Sister     breast   . Hypertension Son   . Sleep apnea Son   . Cancer Sister     pancreatic  . Stroke Maternal Grandmother   . Heart attack Maternal Grandfather   . Alcohol abuse Maternal Uncle   . Colon cancer Neg Hx   . Anesthesia problems Neg Hx   . Hypotension Neg Hx   . Malignant hyperthermia Neg Hx   . Pseudochol deficiency Neg Hx    PE: BP (!) 152/62   Pulse 75   Resp 16   Ht _0  (1.499 m)   Wt 156 lb 6 oz (70.9 kg)   SpO2 95%   BMI 31.58 kg/m  Body mass index is 31.58 kg/m.  Wt Readings from Last 3 Encounters:  07/07/16 156 lb 6 oz (70.9 kg)  06/30/16 147 lb (66.7 kg)  06/16/16 162 lb (73.5 kg)  Constitutional: overweight, in NAD Eyes: PERRLA, EOMI, no exophthalmos ENT: moist mucous membranes,  + large thyroid nodule felt in isthmus, no cervical lymphadenopathy Cardiovascular: RRR, No MRG Respiratory: CTA B Gastrointestinal: abdomen soft, NT, ND, BS+ Musculoskeletal: no deformities, strength intact in all 4,  L foot in a boot. Skin: moist, warm, no rashes Neurological: no tremor with outstretched hands, DTR normal in all 4  ASSESSMENT: 1. DM2, insulin-dependent, uncontrolled, without complications - gastroparesis - cerebrovasc. Ds -  H/o stroke - PN  She was interested in an insulin pump >> discussed about [employer] (given brochure) at a previous visit.  2. Hypothyroidism  3. MNG - Thyroid U/S (10/11/2012):  Right thyroid lobe: 3.7 x 1.0 x 1.8 cm  Left thyroid lobe: 3.5 x 1.5 x 1.6 cm  Isthmus: 0.5 cm  Focal nodules: There is a 0.3 mm calcified nodule in the right midzone. There is a 0.6 x 0.7 x 0.5 cm hypoechoic nodule in the lateral aspect of the right midzone. There is a 0.7 x 0.6 x 0.5 cm hyperechoic nodule in the lateral  aspect of the right lower pole. There is a 2.2 x 1.1 x 1.7 cm complex solid nodule in the inferior aspect  of the isthmus extending adjacent to the left lower pole. There is a 1.4 x 2.1 x 1.4 cm complex solid nodule in the left lower pole.  Lymphadenopathy: None visualized.  IMPRESSION: Multi nodular goiter which has markedly progressed since the prior exam. The dominant nodule at the inferior aspect of the isthmus fits criteria for fine needle aspiration biopsy if not previously Assessed.  - FNA (11/05/2012) x2: benign  - Thyroid U/S (01/20/2014) - felt one nodule enlarging >> new U/S: stable appearance of the nodules, except a small new nodule in isthmus: 1.2 cm   Right thyroid lobe Measurements: 4.4 cm x 1.2 cm x 1.7 cm. Multiple right-sided nodules identified. Each right-sided nodule demonstrates increased echogenicity, with the superior measuring 7 mm -8 mm, most inferior measuring 9 mm - 10 mm. A small, 3 mm focus of calcium with posterior shadowing is evident. There is also a mid nodule measuring 7 mm, which is echogenic.  Left thyroid lobe Measurements: 5.1 cm x 2.1 cm x 2.3 cm. Dominant lesion at the inferior pole of left thyroid is again evident, which has been previously biopsied (10/29/2012). This nodule measures 1.8 cm x 1.7cm x 2.5 cm. (Previous 1.4 cm x 2.1 cm x 1.4 cm)  Isthmus Thickness: 4 mm-5 mm. Isthmic nodule again noted, previously biopsied (10/29/2012). Currently this nodule measures 2.2 cm x 1.4 cm x 1.8 cm. (previous measurement 2.2 cm x 1.1 cm x 1.7 cm). There is a new nodule identified within the isthmus with heterogeneously hyperechoic characteristics. This nodule measures 12 mm x 5.4 mm x 7.2 mm.  Lymphadenopathy: None visualized.  - Thyroid U/S (11/09/2015): Details   Reading Physician Reading Date Result Priority  Sandi Mariscal, MD 11/09/2015   Narrative    CLINICAL DATA: Follow-up thyroid nodules.  EXAM: THYROID ULTRASOUND  TECHNIQUE: Ultrasound  examination of the thyroid gland and adjacent soft tissues was performed.  COMPARISON: Thyroid ultrasound - 01/20/2014 ; 10/11/2012  FINDINGS: Parenchymal Echotexture: Moderately heterogenous  Estimated total number of nodules > 1 cm: <5  Number of spongiform nodules > 2 cm not described below (TR1): 0  Number of mixed cystic nodules > 1.5 cm not described below ([place]): 0  _________________________________________________________  Isthmus: 0.7 cm  Nodule # 1:  Prior biopsy: No  Location: Isthmus; left  Size: 1.5 x 1.3 x 1.5 cm, previously 2.2 x 1.4 x 1.8 cm  Composition: solid/almost completely solid (2)  Echogenicity: hyperechoic (1)  Shape: not taller-than-wide (0)  Margins: ill-defined (0)  Echogenic foci: none (0)  ACR TI-RADS total points: 3.  ACR TI-RADS risk category: TR3 (3 points).  Change in features: Yes. Increased internal cystic degeneration and smaller in size.  Change in ACR TI-RADS risk category: No  ACR TI-RADS recommendations:  *Given size (1.5 - 2.4 cm) and appearance, a follow-up ultrasound in 1 year is recommended based on TI-RADS criteria as clinically indicated.  _________________________________________________________  Right lobe: 4.6 x 1.1 x 1.9 cm, previously 4.4 x 1.2 x 1.7 cm  Stable scattered subcentimeter echogenic nodules  _________________________________________________________  Left lobe: 4.8 x 2.1 x 1.9 cm, previously 5.1 x 2.1 x 2.3 cm  Nodule # 1:  Prior biopsy: No  Location: Left; Inferior  Size: 2.4 x 1.7 x 1.9 cm, previously 2.5 x 1.7 x 1.7  Composition: solid/almost completely solid (2)  Echogenicity: hyperechoic (1)  Shape: not taller-than-wide (0)  Margins: ill-defined (0)  Echogenic foci: none (0)  ACR TI-RADS total points: 3.  ACR TI-RADS risk category: TR3 (3  points).  Change in features: No  Change in ACR TI-RADS risk category: No  ACR TI-RADS recommendations:  *Given size (1.5 -  2.4 cm) and appearance, a follow-up ultrasound in 1 year is recommended based on TI-RADS criteria as clinically indicated.  IMPRESSION: TR 3 isthmic and left thyroid nodules unchanged. *Given size (1.5 - 2.4 cm) and appearance of both thyroid nodules, a follow-up ultrasound in 1 year is recommended based on TI-RADS criteria as clinically indicated.  The above is in keeping with the ACR TI-RADS recommendations - J Am Coll Radiol 2017;14:587-595.   Electronically Signed By: Sandi Mariscal M.D. On: 11/09/2015 19:10       None of the nodules have enlarged or show concerning features. Since they have been biopsied before, will continue following them clinically and by ultrasound.  PLAN:  1. Patient with long-standing, uncontrolled diabetes, on basal-bolus regimen, with again few lows when she sleeps all day and not eat >> advised her that this is not safe... Will decrease her usual Novolog doses a little. - I advised her to:  Patient Instructions  Please continue: - Toujeo 30 units at bedtime - Novolog:  17 units with a smaller meal  24 units with a larger meal or with lunch  Please return in 4 months with your sugar log.   - continue checking sugars at different times of the day - check 3-4 times a day, rotating checks - advised for yearly eye exams >> she is UTD - HbA1c today: 6.6% (great!) - Return to clinic in 4 mo with sugar log   2. Hypothyroidism - latest TFTs normal 01/2016 but since then she moved Aciphex before b'fast !!!) - we discussed how to take the thyroid hormone every day, with water, >30 minutes before breakfast, separated by >4 hours from acid reflux medications, calcium, iron, multivitamins.Will move Aciphex 4h after LT4. - continue LT4 50 mcg daily and on Sunday, 25 mcg  3. MNG - previously had 2 normal Bx's (2014). Reviewed her imaging tests: the ultrasounds from 2014, 2015, 2017 >> stable nodules. - she again c/o dysphagia, but no external Es  compression per Ba swallow from 08/2014  - she did have some Es dysmotility and is s/p multiple Es dilations - no U/S needed unless her dysphagia increases  Philemon Kingdom, MD PhD [facility] Endocrinology

## 2016-07-07 NOTE — Patient Instructions (Addendum)
Please continue: - Toujeo 30 units at bedtime  Change: - Novolog:  17 units with a smaller meal  20 units with a larger meal  24 units when eating out or if you have dessert  Please continue: - continue Levothyroxine 50 mcg daily and on Sunday, 25 mcg.  Take the thyroid hormone every day, with water, at least 30 minutes before breakfast, separated by at least 4 hours from: - acid reflux medications - calcium - iron - multivitamins  Move the acid reflux medication 4h after Levothyroxine.  Please return in 4 months with your sugar log.

## 2016-07-10 ENCOUNTER — Telehealth: Payer: Self-pay | Admitting: Neurology

## 2016-07-10 NOTE — Telephone Encounter (Signed)
Caller: PT  Urgent? No  Reason for the call: PT says she is having a lot of headaches and wants to be seen sooner

## 2016-07-10 NOTE — Telephone Encounter (Signed)
Called patient to ask some questions about her headaches. She sounded very impaired, she would not answer my questions and kept falling asleep on the phone.   I tried to find out what she was taking for her headaches and how long they have been going on. It sounded like possibly two weeks. She states she takes tylenol but did not say how much or how often. When I asked about her oxycodone use she stated "off and on". She did state that the medication dulls the headache down. She complains of blurry vision, which looks like a chronic issue.   I could not triage further as the patient was snoring on the phone and could not answer any more questions. I have put her on a wait list for a sooner appt.

## 2016-07-12 ENCOUNTER — Other Ambulatory Visit: Payer: Self-pay | Admitting: Family Medicine

## 2016-07-12 DIAGNOSIS — Z1231 Encounter for screening mammogram for malignant neoplasm of breast: Secondary | ICD-10-CM

## 2016-07-17 DIAGNOSIS — M6281 Muscle weakness (generalized): Secondary | ICD-10-CM | POA: Diagnosis not present

## 2016-07-17 DIAGNOSIS — I110 Hypertensive heart disease with heart failure: Secondary | ICD-10-CM | POA: Diagnosis not present

## 2016-07-17 DIAGNOSIS — J449 Chronic obstructive pulmonary disease, unspecified: Secondary | ICD-10-CM | POA: Diagnosis not present

## 2016-07-17 DIAGNOSIS — M545 Low back pain: Secondary | ICD-10-CM | POA: Diagnosis not present

## 2016-07-17 DIAGNOSIS — F250 Schizoaffective disorder, bipolar type: Secondary | ICD-10-CM | POA: Diagnosis not present

## 2016-07-17 DIAGNOSIS — F17210 Nicotine dependence, cigarettes, uncomplicated: Secondary | ICD-10-CM | POA: Diagnosis not present

## 2016-07-17 DIAGNOSIS — I509 Heart failure, unspecified: Secondary | ICD-10-CM | POA: Diagnosis not present

## 2016-07-17 DIAGNOSIS — E119 Type 2 diabetes mellitus without complications: Secondary | ICD-10-CM | POA: Diagnosis not present

## 2016-07-17 DIAGNOSIS — I69393 Ataxia following cerebral infarction: Secondary | ICD-10-CM | POA: Diagnosis not present

## 2016-07-18 ENCOUNTER — Encounter (HOSPITAL_COMMUNITY): Payer: Self-pay | Admitting: Psychiatry

## 2016-07-18 ENCOUNTER — Ambulatory Visit (INDEPENDENT_AMBULATORY_CARE_PROVIDER_SITE_OTHER): Payer: Commercial Managed Care - HMO | Admitting: Psychiatry

## 2016-07-18 DIAGNOSIS — F331 Major depressive disorder, recurrent, moderate: Secondary | ICD-10-CM

## 2016-07-18 NOTE — Progress Notes (Signed)
Patient:  [name]   DOB: [birth year]  MR Number: [id number]  Location: [place]:  [address] [place],  [place],          [place]  Start: Tuesday 07/18/2016  10:05 AM  End:       Tuesday 07/18/2016  11:00 AM  Provider/Observer:     Maurice Small, MSW, LCSW   Chief Complaint:     Depression  Reason For Service:               [name] is a 66 y.o. female who presents with a long standing history of recurrent periods of depression beginning when she was 73 and her favorite uncle died. She has been experiencing increased symptoms of depression recently due to stress in the relationship with her son. Per patient's report, he rarely visits her anymore due to his involvement with an older woman who is controlling. She also reports frustration regarding her knees who is her power of attorney and is very demanding per patient's report. She reports issues with other family members and states she needs help dealing with her family Patient reports multiple psychiatric hospitlaizations due to depression and suicidal ideations with thel last one occuring in 1997. Patient has participated in outpatient psychotherapy and medication management intermittently since age 30.  She currently is seeing psychiatrist Dr. Harrington Challenger . Prior to this, she was being seen at [facility]. Patient also has had ECT at [facility]    Interventions Strategy:  Supportive  Participation Level:   Active      Participation Quality:  Appropriate      Behavioral Observation:  Casual, well groomed, more alert, speech very slow   Current Psychosocial Factors: Tension in relationship with son, multiple health issues, concerns about grandson  Content of Session:   reviewed symptoms, reviewed treatment plan, began to discuss possible pleasant activities to pursue, assigned patient to make list and bring to next session, dicussed her patterns of interaction with son/grandson, discussed boundary issues in the  relationships,  Current Status:   less depressed  mood, less anxiety, continued excessive worry  Suicidal/Homicidal:    No   Patient Progress:   Good. Patient says her mood has been better since last session. She has been less depressed and less worried. She reports still having concerns about relationship between her son and her grandson. She expresses continued frustration and says she knows she may have been overprotective. She reports trying to rely more on her spirituality and trying to "let go". She has continued to attend church regularly but reports little other involvement in activity. She says she is planning to join [employer] program.  Target Goals:   1.  Learn and implement behavioral strategies to overcome depression.    2.  Verbalize an understanding and resolution of current interpersonal problems.   Last Reviewed:   03/08/2016  Goals Addressed Today:    1, 2  Plan:      Return again in 2 weeks.  Impression/Diagnosis:   Patient presents with long standing history of recurrent periods  of depression beginning when she was [age] and her favorite uncle died. Patient reports multiple psychiatric hospitlaizations due to depression and suicidal ideations with thel last one occuring in 1997. Patient has participated in outpatient psychotherapy and medication management intermittently since age 52.  She currently is seeing psychiatrist Dr. Harrington Challenger . Prior to this, she was being seen at [facility]. Patient also has had ECT at [facility]. Symptoms  have worsened in recent months due to family stress. Current symptoms include depressed mood, anxiety, excessive worry, and tearfulness.   Diagnosis:  Axis I: MDD recurrent episode, moderate          Axis II: Deferred     [name] Lantzy, LCSW 07/18/2016

## 2016-07-20 IMAGING — DX DG RIBS W/ CHEST 3+V*L*
4 series · 4 of 4 positions shown · non-contrast
Comparison: Prior radiograph from 06/02/2014.

CLINICAL DATA: Initial valuation for acute trauma, fall, left-sided
rib pain.

EXAM:
LEFT RIBS AND CHEST - 3+ VIEW

[chest pa]
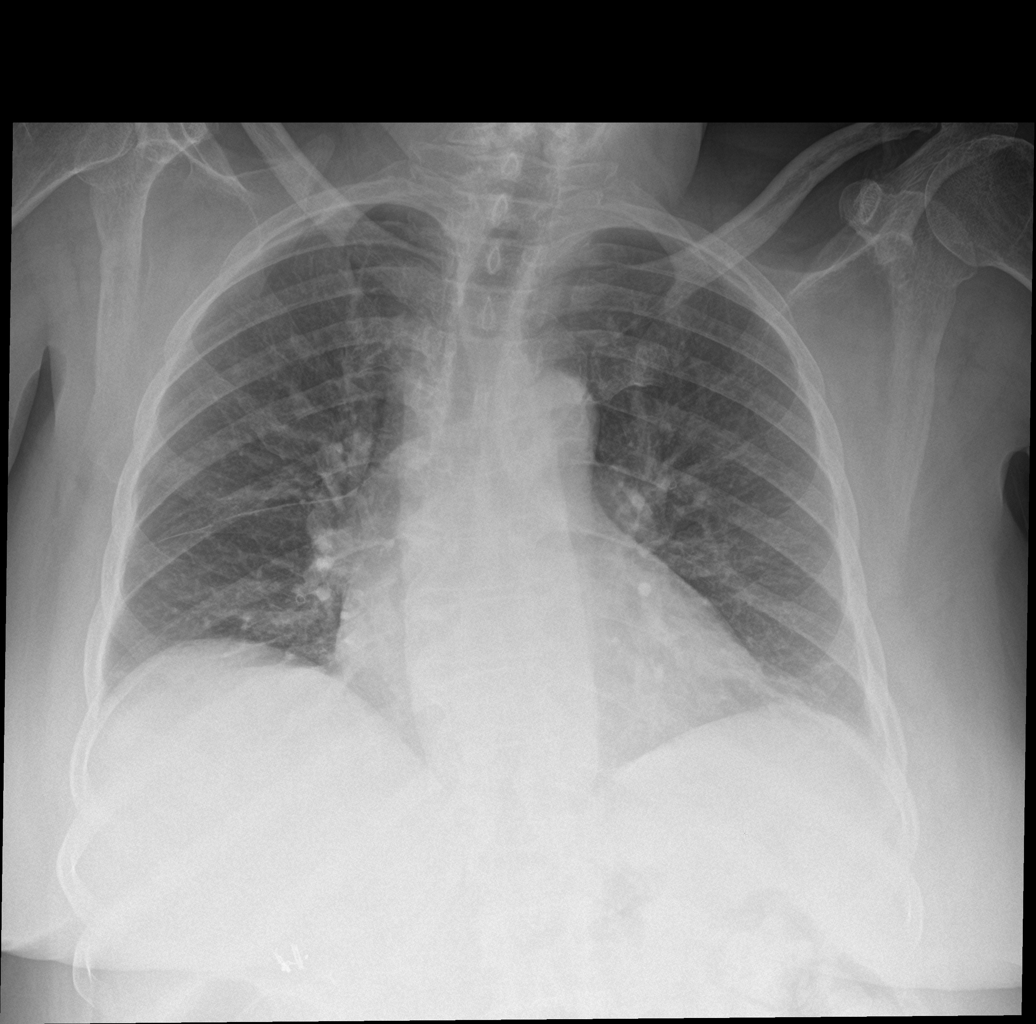

[rib pa obl (1 of 2)]
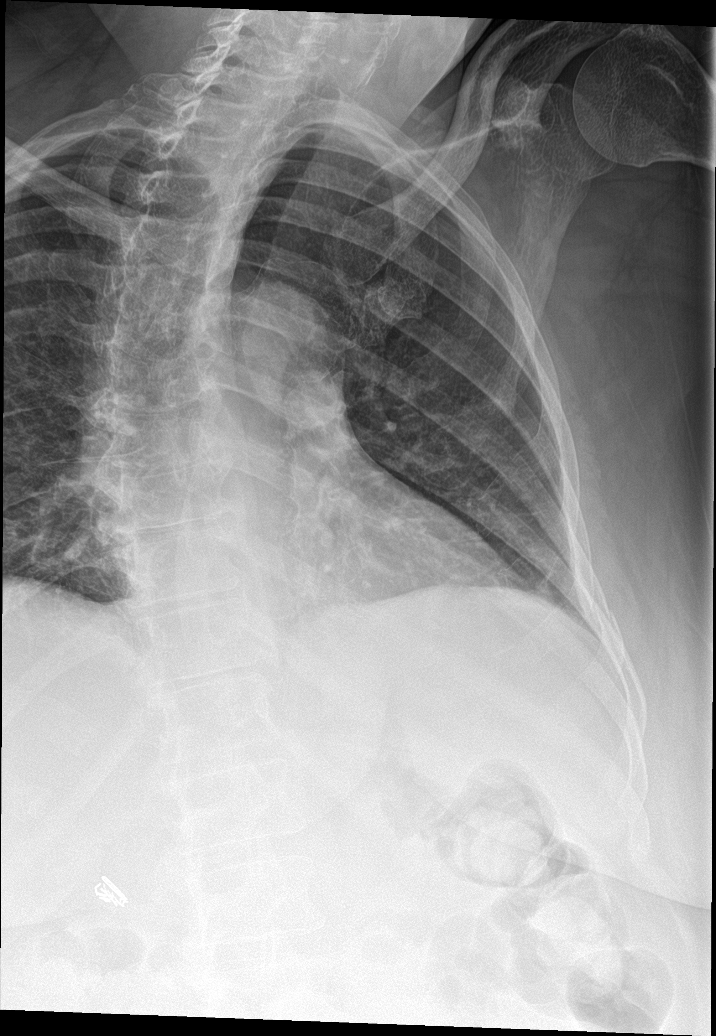

[rib pa obl (2 of 2)]
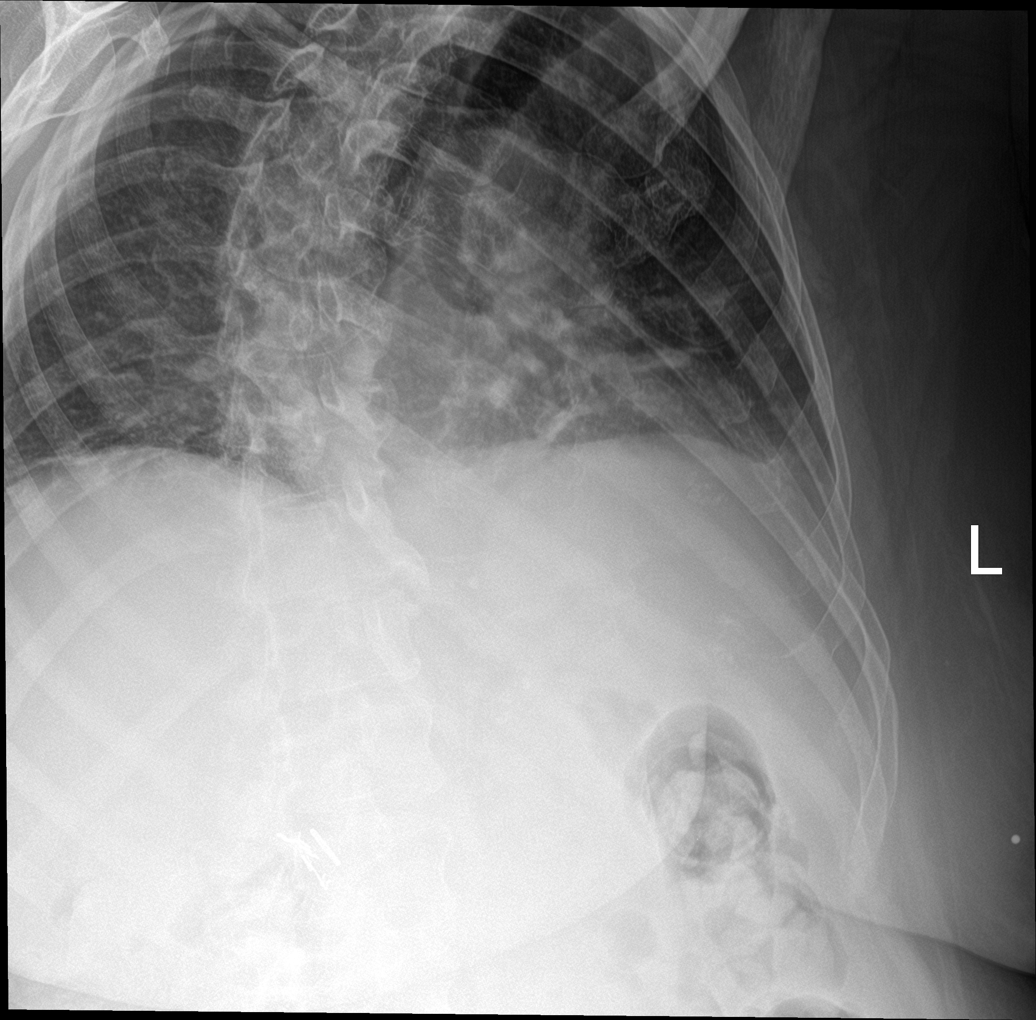

[rib pa]
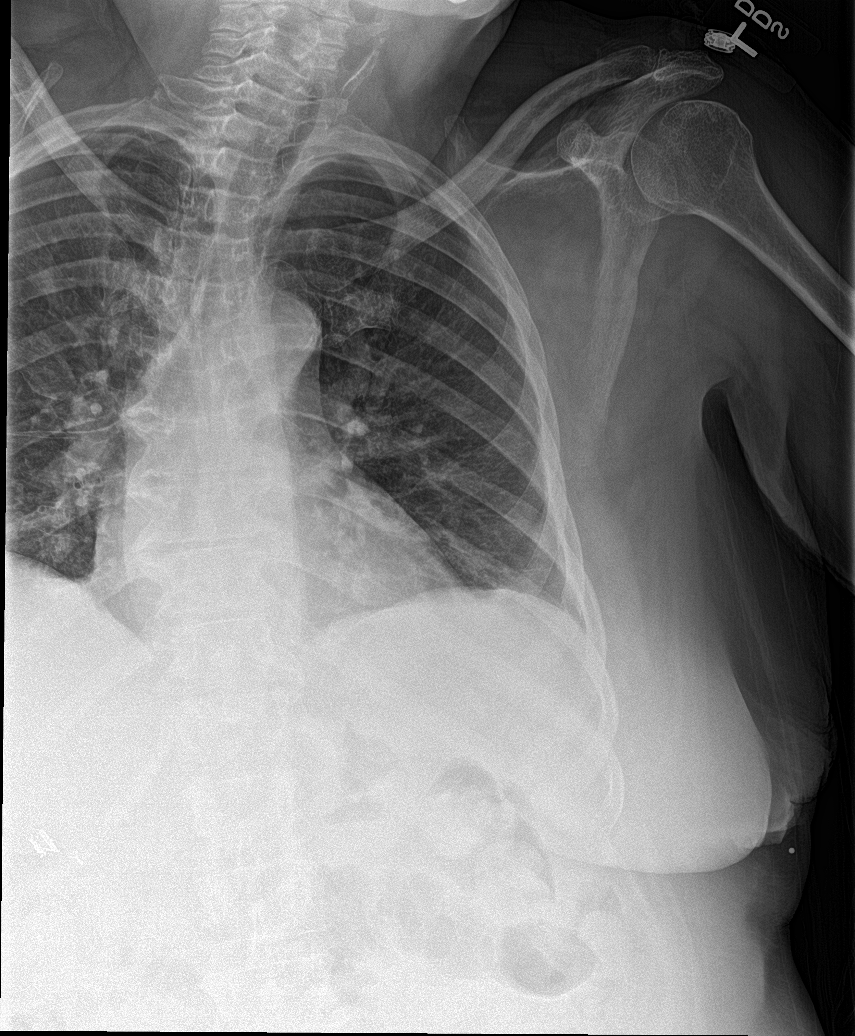

[4 of 4 positions shown; findings below may reference images not displayed]

FINDINGS: Cardiac and mediastinal silhouettes are within normal limits.

Lungs are mildly hypoinflated. Scatter linear opacity at the left
lung base most consistent with subsegmental atelectasis. No focal
infiltrate. No pulmonary edema or pleural effusion. No pneumothorax.

Dedicated views of the left ribs demonstrate an acute minimally
displaced fracture of the left posterolateral seventh rib. No other
definite acute displaced rib fractures.
IMPRESSION: 1. Acute minimally displaced fracture of the left posterolateral
seventh rib.
2. Mild left basilar atelectasis. No other active cardiopulmonary
disease.

## 2016-07-21 ENCOUNTER — Other Ambulatory Visit: Payer: Self-pay

## 2016-07-21 ENCOUNTER — Telehealth: Payer: Self-pay | Admitting: Family Medicine

## 2016-07-21 MED ORDER — DICLOFENAC SODIUM 1 % TD GEL: 2.0000 g | Freq: Every day | TRANSDERMAL | 2 refills | Status: DC | PRN

## 2016-07-21 NOTE — Telephone Encounter (Signed)
Patient is requesting a call back, states her nose is bleeding cb#: [phone]

## 2016-07-21 NOTE — Telephone Encounter (Signed)
Spoke with patient. It has now stopped and I advised her of return instruction per Teoh's last OV but she declined because she doesn't agree with his plan

## 2016-07-27 ENCOUNTER — Ambulatory Visit (INDEPENDENT_AMBULATORY_CARE_PROVIDER_SITE_OTHER)
Admission: RE | Admit: 2016-07-27 | Discharge: 2016-07-27 | Disposition: A | Discharge status: Home / Self Care | Payer: Medicare HMO | Source: Ambulatory Visit | Attending: Internal Medicine | Admitting: Internal Medicine

## 2016-07-27 ENCOUNTER — Ambulatory Visit (INDEPENDENT_AMBULATORY_CARE_PROVIDER_SITE_OTHER): Payer: Medicare HMO | Admitting: Internal Medicine

## 2016-07-27 ENCOUNTER — Encounter: Payer: Self-pay | Admitting: Internal Medicine

## 2016-07-27 VITALS — BP 120/60 | HR 84 | Resp 18 | Ht 59.0 in | Wt 157.6 lb

## 2016-07-27 DIAGNOSIS — Z720 Tobacco use: Secondary | ICD-10-CM

## 2016-07-27 DIAGNOSIS — R911 Solitary pulmonary nodule: Secondary | ICD-10-CM | POA: Diagnosis not present

## 2016-07-27 DIAGNOSIS — J449 Chronic obstructive pulmonary disease, unspecified: Secondary | ICD-10-CM

## 2016-07-27 DIAGNOSIS — G4733 Obstructive sleep apnea (adult) (pediatric): Secondary | ICD-10-CM

## 2016-07-27 DIAGNOSIS — F17210 Nicotine dependence, cigarettes, uncomplicated: Secondary | ICD-10-CM | POA: Diagnosis not present

## 2016-07-27 DIAGNOSIS — J9811 Atelectasis: Secondary | ICD-10-CM | POA: Diagnosis not present

## 2016-07-27 MED ORDER — ALBUTEROL SULFATE HFA 108 (90 BASE) MCG/ACT IN AERS: INHALATION_SPRAY | RESPIRATORY_TRACT | 3 refills | Status: DC

## 2016-07-27 NOTE — Progress Notes (Signed)
HPI F Former smoker followed for obstructive sleep apnea with hypersomnia, , R/O'd Lung nodule,Wheezing dyspnea, complicated by hx resection of meningioma. Bipolar, GERD, DM, chronic epistaxis NPSG 3/8/13[name]- mild obstructive sleep apnea, AHI 14 per hour, mostly in REM. Epworth sleepiness score 14/24. Weight 164 pounds. She is living alone, divorced and disabled. Son has OSA. History of meningioma 11/23/2011. No seizure in over 3 years. Does not drive Office Spirometry 10/30/2013-submaximal effort based on appearance of loop and curve. Numbers would fit with severe restriction but her physiologic capability may be better than this. FVC 0.91/44%, FEV1 0.74/45%, FEV1/FVC 0.81, FEF 25-75% 1.43/69%. ------------------------------------------------------------------------------------------------------------- 05/16/2017v CPAP 8/[employer]> 7 LOV 05/03/15 by NP- acute bronchitis treated with prednisone taper, Z-Pak. Smoking cessation emphasized. She admitted she was not using CPAP and complained. Retired. FOLLOWS FORL: [facility] [employer]. DL attached. Pt states she has not been wearing CPAP due to sinus infection and every time she wears CPAP she gets worse/causes nose bleeds. Went to ENT and was told she had a broken blood vessel. Several psychotropic meds and meds with potential sedation such as trazodone, meclizine, lorazepam. She doesn't know if ENT visit involved cauterizing for epistaxis but bleeding has stopped. Uses nasal pillow mask with CPAP, but stopped for a while because of epistaxis. Has return appointment scheduled. PCP asked about PFT. She denies shortness of breath or cough currently but is not very active. Office Spirometry 07/27/2015-weak effort but still better than her last previous trial effort. Mild restriction of exhaled volume. FVC 1.35/67%, FEV1 1.27/79%, FEV1/FVC 0.94, FEF 25-75 percent 2.14/107%.  07/27/16- 63 yoF smoker followed for obstructive sleep apnea with hypersomnia, , R/O'd  Lung nodule,Wheezing dyspnea, complicated by hx resection of meningioma. Bipolar, GERD, DM, chronic epistaxis CPAP 7/ [employer] FOLLOW UP FOR DME IS [place] patient is not wearing her cpap due to nose bleeds almost every night patient did not bring her cpap in as she was asked to . patient stated that she is not using it right now . Smoking again- discussed Transportation is hard for her-no cough. Continues follow-up with psychiatry.  Has had packing by ENT for recurrent epistaxis- Dr [name]. Little cough. She asks about updating her chest x-ray. Previous imaging had indicated scarring.  ROS-see HPI Constitutional:   No-   weight loss, night sweats, fevers, chills, +fatigue, lassitude. HEENT:  + headaches, +difficulty swallowing, tooth/dental problems, sore throat,       No-sneezing, itching, ear ache, nasal congestion, post nasal drip,  CV:  chest pain, orthopnea, PND, swelling in lower extremities, anasarca, dizziness, +palpitations Resp: +  shortness of breath with exertion or at rest.                productive cough,   non-productive cough,  No- coughing up of blood.              No-   change in color of mucus.   wheezing.   Skin: No-   rash or lesions. GI:  +heartburn, +indigestion, no-abdominal pain, nausea, vomiting,  GU:  MS:  + joint pain or swelling.   Neuro-     nothing unusual Psych:  No- change in mood or affect. +depression or +anxiety.  No memory loss.  OBJ- Physical Exam - + she speaks slowly but seems alert and appropriate, neatly dressed today General-  Oriented, Affect-flat, Distress- none acute Skin- rash-none, lesions- none, excoriation- none Lymphadenopathy- none Head- Eyes- Gross vision intact, PERRLA, conjunctivae and secretions clear  Ears- Hearing, canals-normal            Nose- no-Septal dev, polyps, erosion, perforation             Throat- Mallampati III , mucosa clear , drainage- none, tonsils- atrophic, + dentures Neck- flexible , trachea midline, no  stridor , thyroid nl, carotid no bruit Chest - symmetrical excursion , unlabored           Heart/CV- RRR , no murmur , no gallop  , no rub, nl s1 s2                           - JVD- none , edema- none, stasis changes- none, varices- none           Lung- clear to P&A, wheeze- none, cough+rattling , dullness-none, rub- none           Chest wall-  Abd-  Br/ Gen/ Rectal- Not done, not indicated Extrem- cyanosis- none, clubbing, none, atrophy- none, strength- nl. +cane Neuro- + seems fully oriented. Rocking in chair.

## 2016-07-27 NOTE — Patient Instructions (Addendum)
Order- DME [name] please change mask from nasal pillows to either full face or nasal mask. Trying to reduce epistaxis related to nasal pillows. Reduce pressure to CPAP 6, humidifier, supplies, AirView    Dx OSA  Order- CXR   Dx tobacco user  Refill script for albuterol inhaler

## 2016-07-28 DIAGNOSIS — G4733 Obstructive sleep apnea (adult) (pediatric): Secondary | ICD-10-CM | POA: Diagnosis not present

## 2016-07-28 DIAGNOSIS — Z720 Tobacco use: Secondary | ICD-10-CM | POA: Diagnosis not present

## 2016-07-30 ENCOUNTER — Encounter: Payer: Self-pay | Admitting: Internal Medicine

## 2016-07-30 NOTE — Assessment & Plan Note (Addendum)
She is breathing comfortably and denies exacerbation. Does  Smoke a little now. Has been unable to generate a reliable spirometry effort.

## 2016-07-30 NOTE — Assessment & Plan Note (Signed)
Plan- CXR 

## 2016-07-30 NOTE — Assessment & Plan Note (Signed)
We discussed importance of getting her epistaxis/nasal problems cleared by ENT so she can resume using CPAP.

## 2016-07-30 NOTE — Assessment & Plan Note (Signed)
Smoking cessation emphasized. Support offered.

## 2016-07-31 DIAGNOSIS — M150 Primary generalized (osteo)arthritis: Secondary | ICD-10-CM | POA: Diagnosis not present

## 2016-07-31 DIAGNOSIS — I639 Cerebral infarction, unspecified: Secondary | ICD-10-CM | POA: Diagnosis not present

## 2016-07-31 DIAGNOSIS — R32 Unspecified urinary incontinence: Secondary | ICD-10-CM | POA: Diagnosis not present

## 2016-08-01 ENCOUNTER — Other Ambulatory Visit: Payer: Self-pay | Admitting: Internal Medicine

## 2016-08-01 DIAGNOSIS — I69393 Ataxia following cerebral infarction: Secondary | ICD-10-CM | POA: Diagnosis not present

## 2016-08-03 ENCOUNTER — Telehealth: Payer: Self-pay | Admitting: Family Medicine

## 2016-08-03 NOTE — Telephone Encounter (Signed)
I had advised her that the next time she had a nosebleed that she was supposed to go to [place] to have a procedure done that could only ne done during a nosebleed. She refuses to do that. Unable to call her back today but is there anything that you want me to tell her regarding the return of the nosebleeds?

## 2016-08-03 NOTE — Telephone Encounter (Signed)
Patient called leaving voice mail today stating that she was waiting for the nurse to call her about nosebleeds.  cb  [phone]

## 2016-08-03 NOTE — Telephone Encounter (Signed)
I spoke directly with patient states she was told to go to [place]  By Dr [name] if she has recurrent nose bleed which she reports having today, still not following direction, and calling in with c/o nosebleed I advised her to go to the [facility] ED as directed and if unable to get there, then she should go to the nearer eD , [facility] ED and explain her situation. She wet on to say she was concerned about a headache and I explained that I could not establish whether the headache was related to the nosebleed and it was very important she had her nosebleed addressed

## 2016-08-05 DIAGNOSIS — M79609 Pain in unspecified limb: Secondary | ICD-10-CM | POA: Diagnosis not present

## 2016-08-09 ENCOUNTER — Ambulatory Visit (INDEPENDENT_AMBULATORY_CARE_PROVIDER_SITE_OTHER): Payer: Medicare HMO | Admitting: Neurology

## 2016-08-09 ENCOUNTER — Encounter: Payer: Self-pay | Admitting: Neurology

## 2016-08-09 VITALS — BP 130/70 | HR 68 | Ht 59.0 in | Wt 157.0 lb

## 2016-08-09 DIAGNOSIS — D329 Benign neoplasm of meninges, unspecified: Secondary | ICD-10-CM | POA: Diagnosis not present

## 2016-08-09 DIAGNOSIS — R519 Headache, unspecified: Secondary | ICD-10-CM

## 2016-08-09 DIAGNOSIS — G40209 Localization-related (focal) (partial) symptomatic epilepsy and epileptic syndromes with complex partial seizures, not intractable, without status epilepticus: Secondary | ICD-10-CM | POA: Diagnosis not present

## 2016-08-09 DIAGNOSIS — E118 Type 2 diabetes mellitus with unspecified complications: Secondary | ICD-10-CM | POA: Diagnosis not present

## 2016-08-09 DIAGNOSIS — Z794 Long term (current) use of insulin: Secondary | ICD-10-CM | POA: Diagnosis not present

## 2016-08-09 DIAGNOSIS — R51 Headache: Secondary | ICD-10-CM | POA: Diagnosis not present

## 2016-08-09 DIAGNOSIS — Z720 Tobacco use: Secondary | ICD-10-CM | POA: Diagnosis not present

## 2016-08-09 DIAGNOSIS — E785 Hyperlipidemia, unspecified: Secondary | ICD-10-CM | POA: Diagnosis not present

## 2016-08-09 DIAGNOSIS — I69359 Hemiplegia and hemiparesis following cerebral infarction affecting unspecified side: Secondary | ICD-10-CM

## 2016-08-09 MED ORDER — COENZYME Q-10 100 MG PO CAPS: 100.0000 mg | ORAL_CAPSULE | Freq: Three times a day (TID) | ORAL | 5 refills | Status: DC

## 2016-08-09 MED ORDER — MAGNESIUM CITRATE 200 MG PO TABS: 400.0000 mg | ORAL_TABLET | Freq: Every day | ORAL | 5 refills | Status: DC

## 2016-08-09 MED ORDER — RIBOFLAVIN 400 MG PO TABS: 400.0000 mg | ORAL_TABLET | Freq: Every day | ORAL | 5 refills | Status: DC

## 2016-08-09 MED ORDER — PREDNISONE 10 MG PO TABS: ORAL_TABLET | ORAL | 0 refills | Status: DC

## 2016-08-09 NOTE — Patient Instructions (Signed)
1.  To help break headache, take prednisone taper.  Take 6tabs x1day, then 5tabs x1day, then 4tabs x1day, then 3tabs x1day, then 2tabs x1day, then 1tab x1day, then STOP.  Take the reflux medicine since it may upset your stomach 2.  Take magnesium citrate 400mg  daily, riboflavin 400mg  daily and coenzyme Q10 100mg  three times daily to try and reduce frequency of headache. 3.  Limit Tylenol to no more than 2 days out of the week to prevent rebound headache 4.  Use the CPAP 5.  Continue Lamictal 100mg  twice daily 6.  Follow up in 6 months

## 2016-08-09 NOTE — Progress Notes (Signed)
NEUROLOGY FOLLOW UP OFFICE NOTE  [name] [id number]  HISTORY OF PRESENT ILLNESS: [name] is a 66 year old right-handed woman with hypertension, stroke, type II diabetes, , seizure disorder, mononeuritis of leg, OSA, thyroid nodule, cervical disc disease, depression, anxiety, chronic pain, chronic back pain with gait instability and falls, polypharmacy, and history of stroke with residual left-sided weakness and meningioma resection in September 2013 who follows up for cerebrovascular disease with cognitive impairment and history of stroke, as well as seizures, migraines, and meningioma.    05/14/16 LABS:  CBC with WBC 6.6, HGB 10.7, HCT 34.6, PLT 190, MCV 74.2; CMP with Na 141, K 3.1, Cl 109, CO2 25, glucose 83, BUN 12, Cr 0.67, total bili 0.3, ALP 97, AST 29 and ALT 25.    HEADACHES/MIGRAINES: History:  She has longstanding history of multifactorial chronic headaches.  They are described as from the back of her head and top of the front of her head.  It is a pounding pain, 8/10.  There is some nausea, but no photophobia or phonophobia.  She has blurred vision, but that is constant.  She has history of medication-overuse and opioids for other pain.    Prior medications included gabapentin, Lyrica, metoprolol, amitriptyline, and imipramine.  She also tried trigger point injections.  She is not a candidate for triptans due to history of stroke.   Update:  Headaches daily over the past 6 months.  Over the course of that time, she was off of CPAP until her epistaxis was evaluated by ENT.  CPAP was restarted about 2 weeks ago.  She has been taking Tylenol daily.  Headaches are moderate intensity and constant.  No specific triggers or relieving factors.   SEIZURES: History:  She has history of seizures for many years.  Her last seizure was about 3 years ago.  She described them as staring spells.  Her EEG from 08/08/12 revealed right temporal epileptiform discharges.     Since June 2016, she  has had "spells" where she suddenly doesn't feel well.  She notes numbness in the left leg and will sometimes fall during these spells.  She had a fall in late July.  There was no loss of consciousness.  MRI of the brain performed on 02/01/15 showed a tiny area of altered signal in left pons, which location doesn't correlate with symptoms and seems more likely to be T2 shine through.   Update:  She is currently taking Lamictal 131m twice daily.   MENINGIOMA: She had a known history of meningioma.  She presented to the ED in August 2013 with sudden onset numbness of the feet and left hand weakness and numbness.  MRI of brain noted enlargement of previous planum sphenoidale meningioma compared to prior study in 2009, with vasogenic edema.  In September 2013, she underwent a bicoronal craniotomy and gross total resection.  Repeat MRI of brain with and without contrast performed on 05/01/12 revealed postop resection of meningioma of the planum sphenoidale with small amount of residual enhancing tissue along the planum sphenoidale (5 x 877m, postop blood in the bilateral frontal lobes and bilateral encephalomalacia of the gyrus rectus bilaterally.   She had a repeat MRI of the brain with and without contrast performed on 02/22/15, which revealed no meningioma recurrence.  However, a repeat MRI of the brain by Dr. NuSherwood Gamblerher neurosurgeon, in December 2017 reportedly showed recurrence of meningioma.   STROKE: She has a history of stroke going back several years ago.  Semiology is unclear but reports left-sided issues.  She was started on Plavix, but she was switched over to ASA 58m. She was hospitalized in December 2016 for chest pain and worsening left arm weakness.  MRI of brain showed was personally reviewed and showed frontal encephalomalacia but no acute stroke or meningioma recurrence.  MRI of cervical spine was personally reviewed and revealed mild spondylitic changes.  She was found to have  bronchitis, so symptoms have have been recrudescence of prior stroke symptoms.   Carotid doppler from 11/06/14 showed bilateral atherosclerosis with less than 50% stenosis bilaterally.   Update: 04/18/16 LDL 75; 07/07/16 Hgb A1c 6.6.   MEMORY: She also reports problems with memory, word-finding difficulties, and problems with name recognition of acquaintances of people at church.   PAST MEDICAL HISTORY: Past Medical History:  Diagnosis Date  . Anemia   . Anxiety    takes Ativan daily  . Arthritis   . Bipolar disorder ([facility]    takes Risperdal nightly  . Blood transfusion   . Cancer ([facility]    In her gum  . Carpal tunnel syndrome of right wrist 05/23/2011  . Cervical disc disorder with radiculopathy of cervical region 10/31/2012  . Chronic back pain   . Chronic idiopathic constipation   . Colon polyps   . COPD (chronic obstructive pulmonary disease) with chronic bronchitis ([facility] 09/16/2013   Office Spirometry 10/30/2013-submaximal effort based on appearance of loop and curve. Numbers would fit with severe restriction but her physiologic capability may be better than this. FVC 0.91/44%, and 10.74/45%, FEV1/FVC 0.81, FEF 25-75% 1.43/69%    . Depression    takes Zoloft daily  . Diabetes mellitus    Type II  . Diverticulosis    TCS 9/08 by Dr. DDelfin Edisfor diarrhea . Bx for micro scopic colitis negative.   . Fibromyalgia   . Frequent falls   . GERD (gastroesophageal reflux disease)    takes Aciphex daily  . Glaucoma    eye drops daily  . Gum symptoms    infection on antibiotic  . Hemiplegia affecting non-dominant side, post-stroke 08/02/2011  . Hyperlipidemia    takes Crestor daily  . Hypertension    takes Amlodipine,Metoprolol,and Clonidine daily  . Hypothyroidism    takes Synthroid daily  . IBS (irritable bowel syndrome)   . Insomnia    takes Trazodone nightly  . Metabolic encephalopathy 52/70/3500 . Migraines    chronic headaches  . Mononeuritis lower limb   . Osteoporosis    . Pancreatitis 2006   due to Depakote with normal EUS   . Schatzki's ring    non critical / EGD with ED 8/2011with RMR  . Seizures ([facility]    takes Lamictal daily.Last seizure 3 yrs ago  . Sleep apnea    on CPAP  . Stroke ([facility]    left sided weakness  . Tubular adenoma of colon     MEDICATIONS: Current Outpatient Prescriptions on File Prior to Visit  Medication Sig Dispense Refill  . ACCU-CHEK FASTCLIX LANCETS MISC Use to test blood sugar 4 times daily. Dx: E10.65 408 each 1  . ACCU-CHEK SMARTVIEW test strip TEST BLOOD SUGAR FOUR TIMES DAILY AS DIRECTED 350 each 2  . albuterol (PROVENTIL HFA;VENTOLIN HFA) 108 (90 Base) MCG/ACT inhaler Inhale 1-2 puffs every 6 hours as needed for wheezing, shortness of breath 3 Inhaler 3  . Alcohol Swabs (B-D SINGLE USE SWABS REGULAR) PADS Use for injections and glucose testing 8 times daily. Dx: E10.65 900  each 1  . amLODipine (NORVASC) 5 MG tablet TAKE 2 TABLETS BY MOUTH ONCE DAILY. 60 tablet 0  . aspirin EC 81 MG tablet Take 81 mg by mouth daily.    . Azelastine-Fluticasone (DYMISTA) 137-50 MCG/ACT SUSP Place 1 puff into the nose at bedtime. 1 Bottle 0  . BD PEN NEEDLE NANO U/F 32G X 4 MM MISC USE FOUR TIMES DAILY 360 each 3  . Blood Glucose Calibration (ACCU-CHEK SMARTVIEW CONTROL) LIQD 1 each by Other route as needed. 1 each 2  . Blood Glucose Monitoring Suppl (ACCU-CHEK NANO SMARTVIEW) W/DEVICE KIT 1 each by Does not apply route daily. Dx: E10.65 1 kit 0  . Cholecalciferol (VITAMIN D) 2000 units CAPS Take 1 capsule by mouth daily.    . clobetasol cream (TEMOVATE) 1.91 % Apply 1 application topically daily.    . cloNIDine (CATAPRES) 0.3 MG tablet TAKE 1 TABLET BY MOUTH EVERY EIGHT HOURS. ONCE AT 8AM, 4PM, AND 12 MIDNIGHT. 270 tablet 1  . diclofenac sodium (VOLTAREN) 1 % GEL Apply 2 g topically daily as needed (Pain). 100 g 2  . dicyclomine (BENTYL) 10 MG capsule TAKE 1 CAPSULE BY MOUTH THREE TIMES DAILY BEFORE MEALS. 270 capsule 1  . doxycycline  (VIBRA-TABS) 100 MG tablet Take 1 tablet (100 mg total) by mouth 2 (two) times daily. 20 tablet 0  . hydroxychloroquine (PLAQUENIL) 200 MG tablet Take 200 mg by mouth daily.    . Insulin Glargine (TOUJEO SOLOSTAR) 300 UNIT/ML SOPN Inject 25 Units into the skin at bedtime. 6 pen 3  . lamoTRIgine (LAMICTAL) 100 MG tablet TAKE 1 TABLET BY MOUTH TWICE DAILY. 60 tablet 6  . levothyroxine (SYNTHROID, LEVOTHROID) 50 MCG tablet TAKE 1 TABLET BY MOUTH ONCE DAILY AND 1/2 TABLET ON SUNDAYS. 30 tablet 0  . LORazepam (ATIVAN) 0.5 MG tablet Take 1 tablet (0.5 mg total) by mouth 3 (three) times daily. 90 tablet 2  . losartan (COZAAR) 50 MG tablet TAKE ONE TABLET BY MOUTH DAILY. 90 tablet 1  . methocarbamol (ROBAXIN) 500 MG tablet Take 500 mg by mouth daily.    . metoprolol (LOPRESSOR) 50 MG tablet TAKE 1 TABLET BY MOUTH TWICE DAILY. 180 tablet 3  . montelukast (SINGULAIR) 10 MG tablet TAKE ONE TABLET BY MOUTH ONCE DAILY. 90 tablet 1  . Multiple Vitamins-Minerals (ONE-A-DAY 50 PLUS PO) Take 1 tablet by mouth daily.    [name] NOVOLOG FLEXPEN 100 UNIT/ML FlexPen INJECT 16 TO 30 UNITS INTO THE SKIN 3 (THREE) TIMES DAILY WITH MEALS. 90 mL 2  . nystatin (MYCOSTATIN/NYSTOP) powder APPLY TO AFFECTED AREA FOUR TIMES DAILY. 30 g 2  . ondansetron (ZOFRAN) 4 MG tablet Take 1 tablet (4 mg total) by mouth every 8 (eight) hours as needed for nausea or vomiting. 20 tablet 0  . Plecanatide (TRULANCE) 3 MG TABS Take 3 mg by mouth daily. 30 tablet 2  . pregabalin (LYRICA) 75 MG capsule Take 75 mg by mouth 2 (two) times daily.     . RABEprazole (ACIPHEX) 20 MG tablet Take 1 tablet (20 mg total) by mouth 2 (two) times daily. 180 tablet 1  . RESTASIS 0.05 % ophthalmic emulsion Place 1 drop into both eyes 2 (two) times daily.     . risperiDONE (RISPERDAL) 0.5 MG tablet Take 1 tablet (0.5 mg total) by mouth at bedtime. 90 tablet 2  . rosuvastatin (CRESTOR) 5 MG tablet TAKE 1 TABLET BY MOUTH AT BEDTIME. 90 tablet 1  . sertraline (ZOLOFT)  100 MG tablet Take 2  tablets (200 mg total) by mouth daily. 180 tablet 2  . traZODone (DESYREL) 150 MG tablet Take 1 tablet (150 mg total) by mouth at bedtime. 90 tablet 2  . vitamin B-12 (CYANOCOBALAMIN) 1000 MCG tablet Take 1,000 mcg by mouth daily.    . vitamin E 400 UNIT capsule Take 400 Units by mouth daily.     Current Facility-Administered Medications on File Prior to Visit  Medication Dose Route Frequency Provider Last Rate Last Dose  . 0.9 %  sodium chloride infusion  500 mL Intravenous Continuous Pyrtle, Lajuan Lines, MD        ALLERGIES: Allergies  Allergen Reactions  . Cephalexin Hives  . Iron Nausea And Vomiting  . Milk-Related Compounds Other (See Comments)    Doesn't agree with stomach.   . Penicillins Hives    Has patient had a PCN reaction causing immediate rash, facial/tongue/throat swelling, SOB or lightheadedness with hypotension: Yes Has patient had a PCN reaction causing severe rash involving mucus membranes or skin necrosis: No Has patient had a PCN reaction that required hospitalization No Has patient had a PCN reaction occurring within the last 10 years: No If all of the above answers are "NO", then may proceed with Cephalosporin use.   [name] Phenazopyridine Hcl Hives          FAMILY HISTORY: Family History  Problem Relation Age of Onset  . Heart attack Mother        HTN  . Pneumonia Father   . Kidney failure Father   . Diabetes Father   . Pancreatic cancer Sister   . Diabetes Brother   . Hypertension Brother   . Diabetes Brother   . Cancer Sister        breast   . Hypertension Son   . Sleep apnea Son   . Cancer Sister        pancreatic  . Stroke Maternal Grandmother   . Heart attack Maternal Grandfather   . Alcohol abuse Maternal Uncle   . Colon cancer Neg Hx   . Anesthesia problems Neg Hx   . Hypotension Neg Hx   . Malignant hyperthermia Neg Hx   . Pseudochol deficiency Neg Hx     SOCIAL HISTORY: Social History   Social History  . Marital  status: Divorced    Spouse name: N/A  . Number of children: 1  . Years of education: 76   Occupational History  . disabled     Social History Main Topics  . Smoking status: Light Tobacco Smoker    Packs/day: 0.25    Years: 7.00    Types: Cigarettes  . Smokeless tobacco: Never Used     Comment: continues to smoke 1/4 pack a day   . Alcohol use No     Comment:    . Drug use: No  . Sexual activity: No   Other Topics Concern  . Not on file   Social History Narrative  . No narrative on file    REVIEW OF SYSTEMS: Constitutional: No fevers, chills, or sweats, no generalized fatigue, change in appetite Eyes: No visual changes, double vision, eye pain Ear, nose and throat: No hearing loss, ear pain, nasal congestion, sore throat Cardiovascular: No chest pain, palpitations Respiratory:  No shortness of breath at rest or with exertion, wheezes GastrointestinaI: No nausea, vomiting, diarrhea, abdominal pain, fecal incontinence Genitourinary:  No dysuria, urinary retention or frequency Musculoskeletal:  No neck pain, back pain Integumentary: No rash, pruritus, skin lesions Neurological: as above  Psychiatric: No depression, insomnia, anxiety Endocrine: No palpitations, fatigue, diaphoresis, mood swings, change in appetite, change in weight, increased thirst Hematologic/Lymphatic:  No purpura, petechiae. Allergic/Immunologic: no itchy/runny eyes, nasal congestion, recent allergic reactions, rashes  PHYSICAL EXAM: Vitals:   08/09/16 0749  BP: 130/70  Pulse: 68   General: No acute distress.  Patient appears well-groomed. Head:  Normocephalic/atraumatic Eyes:  Fundi examined but not visualized Neck: supple, no paraspinal tenderness, full range of motion Heart:  Regular rate and rhythm Lungs:  Clear to auscultation bilaterally Back: No paraspinal tenderness Neurological Exam: alert and oriented to person, place, and time. Attention span and concentration intact, recent and remote  memory intact, fund of knowledge intact.  Speech fluent and not dysarthric, language intact.  CN II-XII intact. Bulk and tone normal, 5-/5 left hand grip and hip flexion, otherwise muscle strength 5/5 throughout.  Reduced pinprick sensation to left arm and leg. DTR 1+ throughout, except absent at ankles. Finger to nose slowed but no dysmetria. Wide-based gait.  IMPRESSION: Chronic daily headaches.  Possibly increased due to untreated OSA (since she was off of CPAP for some time) and now complicated by medication overuse. Left hemiplegia as late effect of stroke Localization-related epilepsy, stable Meningioma status post resection (she reports recurrence) Hyperlipidemia Type 2 diabetes Tobacco use disorder  PLAN: 1.  For headaches:  recommended magnesium citrate, riboflavin and coQ10.    Limit tylenol to no more than 2 days out of the week    Hopefully will improve with resuming of CPAP 2.  For epilepsy:  Continue Lamictal 175m twice daily 3.  Meningioma monitored by Dr. NSherwood Gambler(neurosurgery) 4.  For secondary stroke prevention:  ASA 883mdaily  Statin therapy as managed by PCP (LDL goal less than 70)  Glycemic control  Smoking cessation 5.  Follow up in 6 months.  AdMetta ClinesDO  CC:  MaTula NakayamaMD

## 2016-08-10 ENCOUNTER — Telehealth: Payer: Self-pay | Admitting: Family Medicine

## 2016-08-10 NOTE — Telephone Encounter (Signed)
Pt left message on nurse line re a Rx that the doctor wants her to come off of.  She doesn't want to do this without talking with [name].  I returned pt's call to get more detail about the Rx and the doctor but the patient does not answer.

## 2016-08-11 ENCOUNTER — Ambulatory Visit (INDEPENDENT_AMBULATORY_CARE_PROVIDER_SITE_OTHER): Payer: Medicare HMO | Admitting: Psychiatry

## 2016-08-11 ENCOUNTER — Encounter (HOSPITAL_COMMUNITY): Payer: Self-pay | Admitting: Psychiatry

## 2016-08-11 DIAGNOSIS — F331 Major depressive disorder, recurrent, moderate: Secondary | ICD-10-CM

## 2016-08-11 NOTE — Progress Notes (Addendum)
Patient:  [name]   DOB: 10-09-1950  MR Number: [id number]  Location: [place]:  [address] [place],  [place],          [place]  Start: Friday 08/11/2016 9:00 AM  End:       Friday 08/11/2016 9:55 AM  Provider/Observer:     Maurice Small, MSW, LCSW   Chief Complaint:     Depression  Reason For Service:               [name] is a 66 y.o. female who presents with a long standing history of recurrent periods of depression beginning when she was 30 and her favorite uncle died. She has been experiencing increased symptoms of depression recently due to stress in the relationship with her son. Per patient's report, he rarely visits her anymore due to his involvement with an older woman who is controlling. She also reports frustration regarding her knees who is her power of attorney and is very demanding per      patient's report. She reports issues with other family members and states she needs help dealing with her family Patient reports multiple psychiatric hospitlaizations due to depression and suicidal ideations with thel last one occuring in 1997. Patient has participated in outpatient psychotherapy and medication management intermittently since age 32.  She currently is seeing psychiatrist Dr. Harrington Challenger . Prior to this, she was being seen at [facility]. Patient also has had ECT at [facility].    Interventions Strategy:  Supportive  Participation Level:   Active      Participation Quality:  Appropriate      Behavioral Observation:  Casual, well groomed, drowsy, speech very slow   Current Psychosocial Factors: Tension in relationship with son, multiple health issues, concerns about grandson  Content of Session:   reviewed symptoms, discussed stressors, did problem solving with patient regarding talking with medical providers regarding her medication,  praised and reinforced efforts to set and maintain boundaries in relationship with son, discussed concerns and thoughts  about grandson, assisted patient identify, challenge, and replace negative thoughts about grandson's future with healthy alternatives, reviewed rationale for pursuing pleasant activities and assigned patient to make list and bring to next session,   Current Status:   less depressed  mood, less anxiety, continued excessive worry  Suicidal/Homicidal:    No   Patient Progress:   Fair. Patient reports increased stress and worry since last session. She has been suffering from headaches/nosebleeds and reports seeing neurologist who prescribed prednisone for her headaches per patient's report. She is reluctant to take the medication as she fears interaction with other medication as well as possible effects on glucose level as patient is a diabetic.  Therapist advised patient to contact PCP. Patient also reports worry about grandson along with his graduation plans and post graduation plans. She expresses frustration he isn't making choices she thinks he should make and fears he will have a negative future. She reports "letting go" of son and says they are getting along better. She reports not completing homework assignment as she lost her homework and has experienced increased memory difficulty along with poor concentration.   Target Goals:   1.  Learn and implement behavioral strategies to overcome depression.    2.  Verbalize an understanding and resolution of current interpersonal problems.   Last Reviewed:   07/18/2016  Goals Addressed Today:    1, 2  Plan:      Return again in 2  weeks.  Impression/Diagnosis:   Patient presents with long standing history of recurrent periods  of depression beginning when she was [age] and her favorite uncle died. Patient reports multiple psychiatric hospitlaizations due to depression and suicidal ideations with thel last one occuring in 1997. Patient has participated in outpatient psychotherapy and medication management intermittently since age 69.  She currently is  seeing psychiatrist Dr. Harrington Challenger . Prior to this, she was being seen at [facility]. Patient also has had ECT at [facility]. Symptoms have worsened in recent months due to family stress. Current symptoms include depressed mood, anxiety, excessive worry, and tearfulness.   Diagnosis:  Axis I: MDD recurrent episode, moderate          Axis II: Deferred     [name] Vantassell, LCSW 08/11/2016

## 2016-08-11 NOTE — Telephone Encounter (Signed)
Tried to call, not available

## 2016-08-14 DIAGNOSIS — I69393 Ataxia following cerebral infarction: Secondary | ICD-10-CM | POA: Diagnosis not present

## 2016-08-15 ENCOUNTER — Telehealth: Payer: Self-pay | Admitting: Family Medicine

## 2016-08-15 NOTE — Telephone Encounter (Signed)
[name] (patient nurse - couldn't hear where she was from) left a voice message that she was concerned about the instructions for patient to stop medications as instructed by [facility] Neurology (being treated for headaches.) She is requesting clearance to follow through with this  [name] cb#: [phone]  Patient cb#: [phone]

## 2016-08-16 ENCOUNTER — Telehealth: Payer: Self-pay | Admitting: Family Medicine

## 2016-08-19 ENCOUNTER — Other Ambulatory Visit: Payer: Self-pay | Admitting: Family Medicine

## 2016-08-21 ENCOUNTER — Ambulatory Visit (INDEPENDENT_AMBULATORY_CARE_PROVIDER_SITE_OTHER): Payer: Medicare HMO | Admitting: Orthopedic Surgery

## 2016-08-21 NOTE — Telephone Encounter (Signed)
I know you had been attempting to call patient but I don't see in the neurology note where they advised her to stop medications. Do you have any updates?

## 2016-08-21 NOTE — Telephone Encounter (Signed)
I reviewed the cardiology note, he never advised her to stop the bP medication and diabetes medication

## 2016-08-21 NOTE — Telephone Encounter (Signed)
I explained this to the pt, she states she is still getting frequent severe headaches , not being relieved , I advised her she needs ti call and schedule a sooner appt with her neurologist

## 2016-08-24 ENCOUNTER — Telehealth (HOSPITAL_COMMUNITY): Payer: Self-pay | Admitting: *Deleted

## 2016-08-24 NOTE — Telephone Encounter (Signed)
left voice message, provider out of office 09/08/15.

## 2016-08-26 IMAGING — MR MR HEAD W/O CM
6 of 10 series · 22 of 48 positions shown · non-contrast
Comparison: 07/13/2014 brain MR.

CLINICAL DATA: 64-year-old female with difficulty walking. Recent
fall without head injury. History of meningioma and resection.
Subsequent encounter.

EXAM:
MRI HEAD WITHOUT CONTRAST
TECHNIQUE: Multiplanar, multiecho pulse sequences of the brain and surrounding
structures were obtained without intravenous contrast.

[Series 3: t1_fl2d_sag · sagittal · 5.0mm · 0.45mm/px · 2 of 20 slices shown]
[im 1/20]
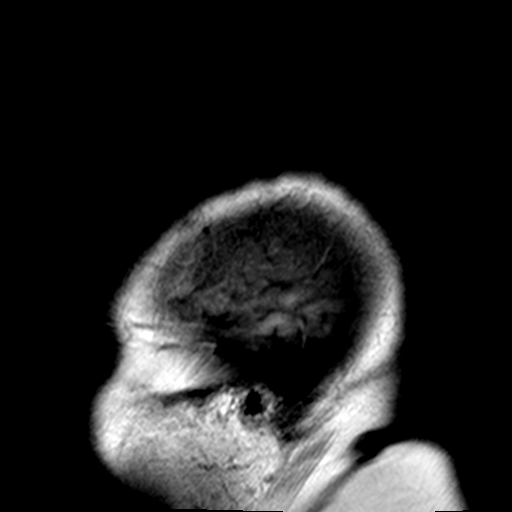
[im 20/20]
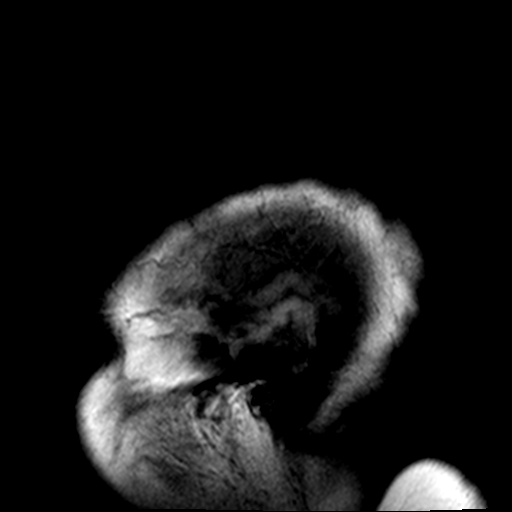

[Series 6: T2 · axial · 5.0mm · 0.51mm/px · z∈[-104,+35]mm · 3 of 23 slices shown (1 of 2)]
[im 1/23]
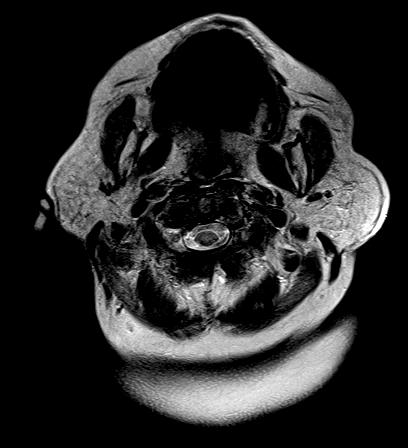
[im 12/23]
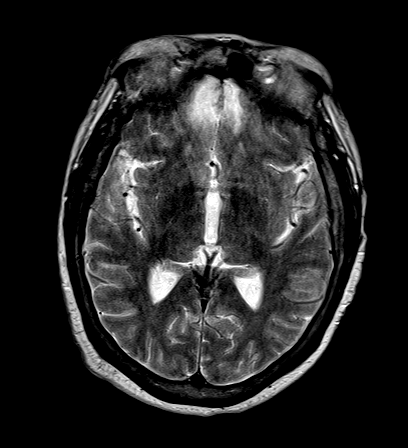
[im 23/23]
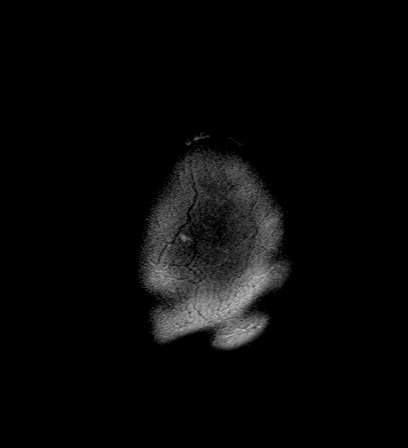

[Series 7: FLAIR · axial · 5.0mm · 0.38mm/px · z∈[-101,+38]mm · 3 of 23 slices shown]
[im 1/23]
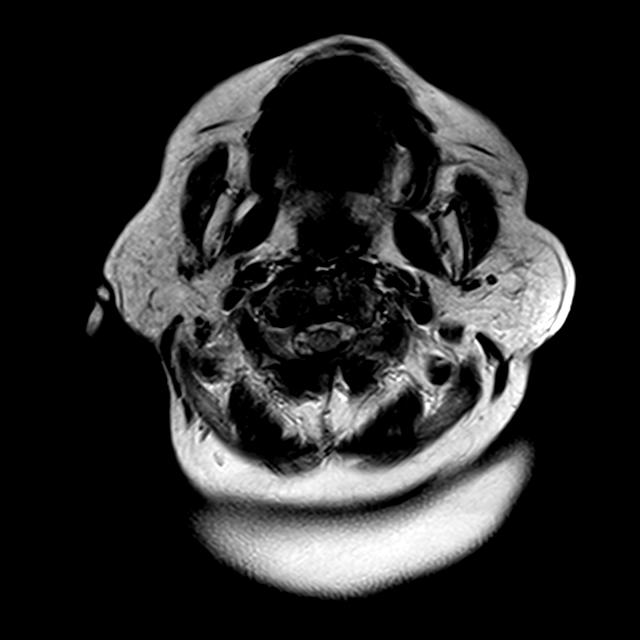
[im 12/23]
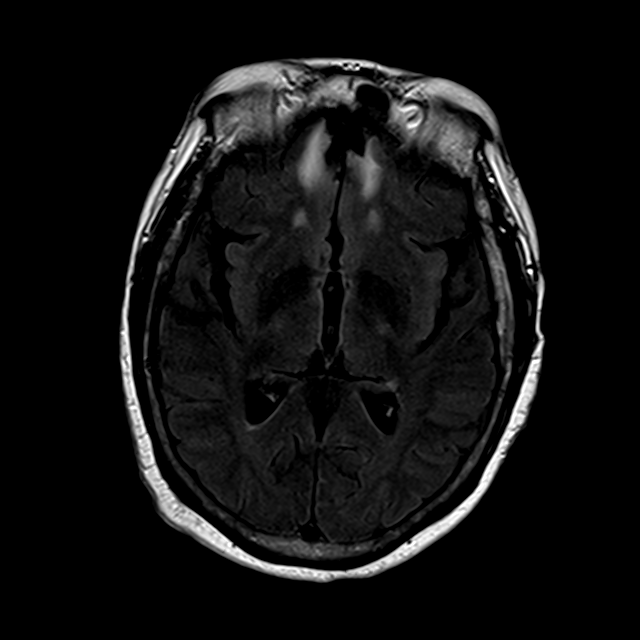
[im 23/23]
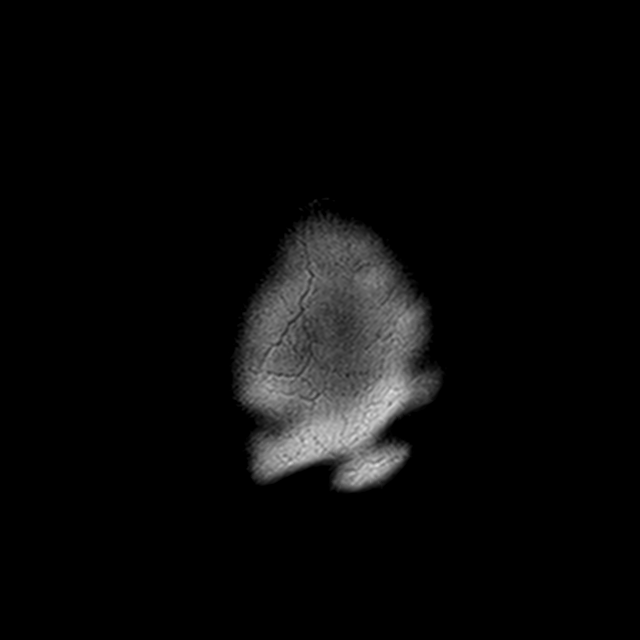

[Series 8: T1 · axial · 2.0mm · 0.45mm/px · z∈[-118,+65]mm · 9 of 95 slices shown]
[im 1/95]
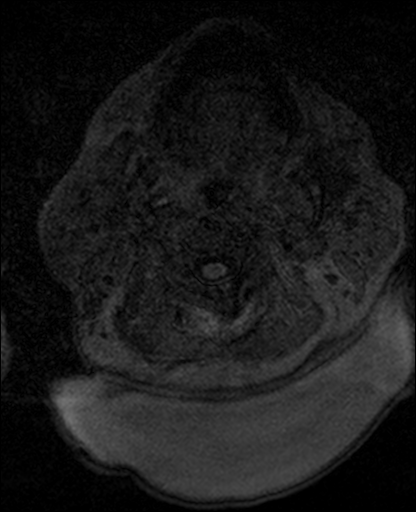
[im 18/95]
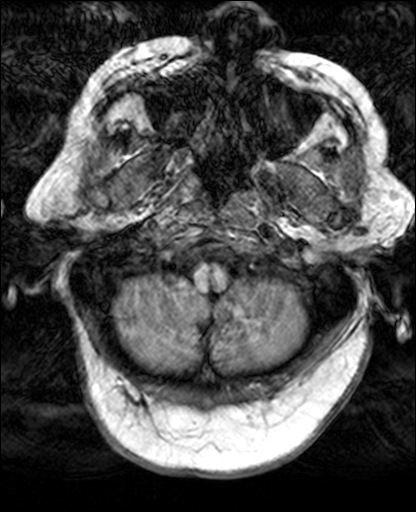
[im 26/95]
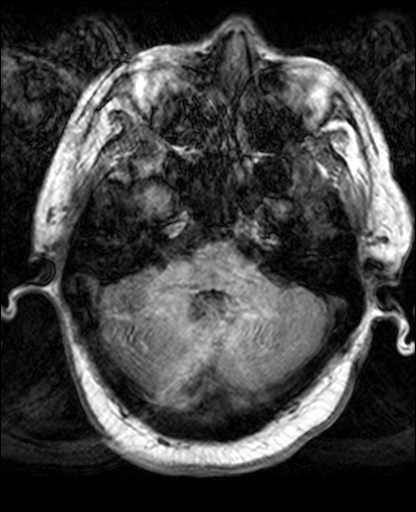
[im 43/95]
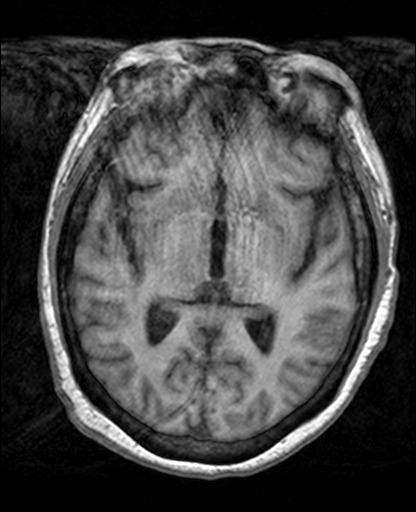
[im 52/95]
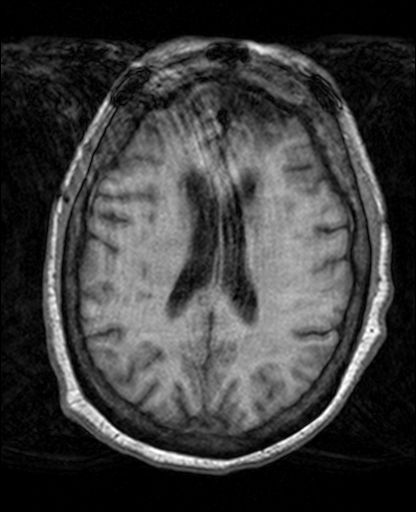
[im 69/95]
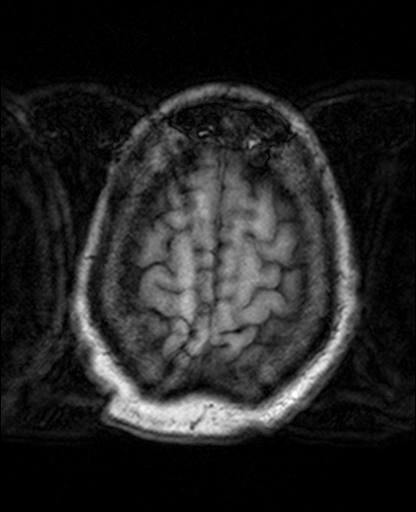
[im 77/95]
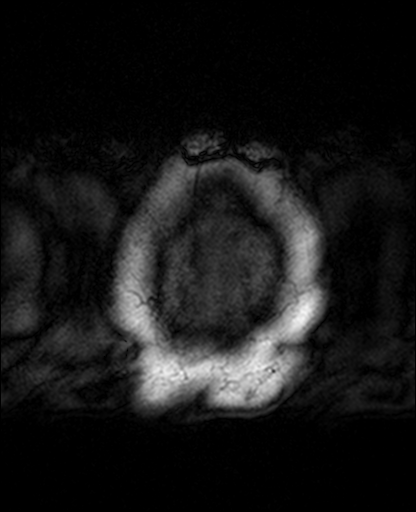
[im 86/95]
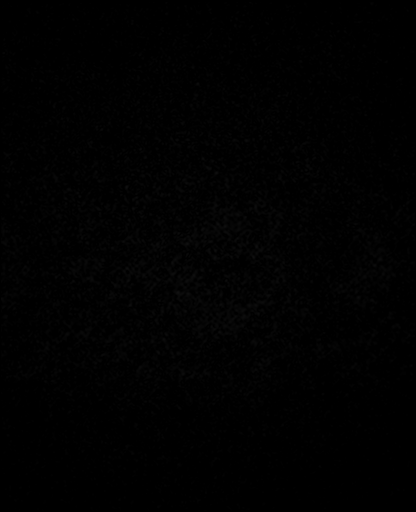
[im 95/95]
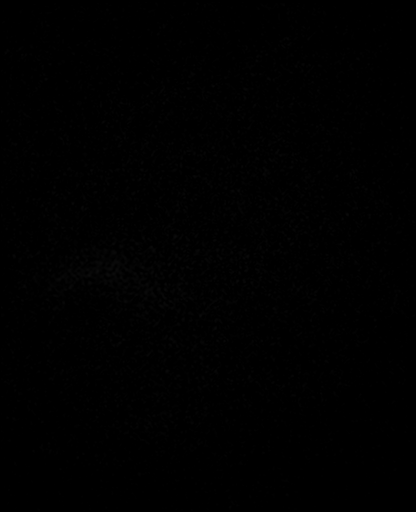

[Series 9: trauma axial · axial · 5.0mm · 0.45mm/px · z∈[-96,-33]mm · 2 of 21 slices shown]
[im 1/21]
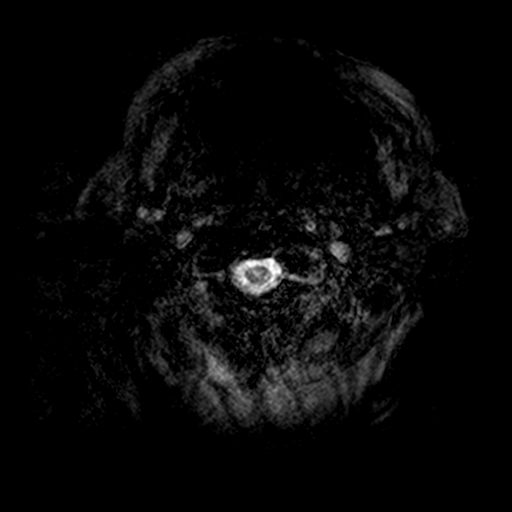
[im 11/21]
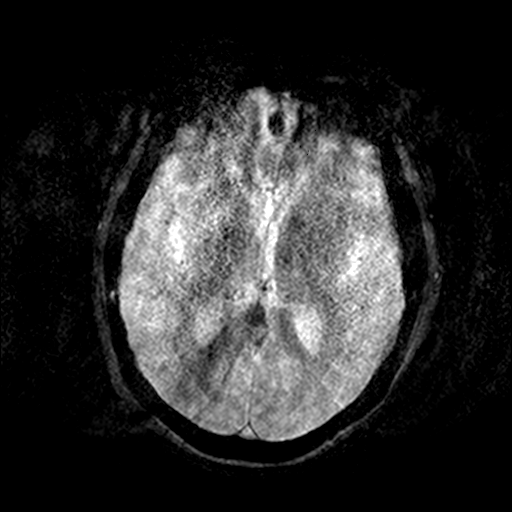

[Series 10: T2 · coronal · 5.0mm · 0.69mm/px · 3 of 28 slices shown (2 of 2)]
[im 1/28]
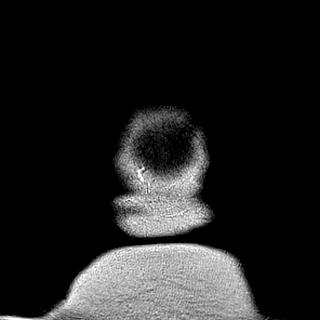
[im 14/28]
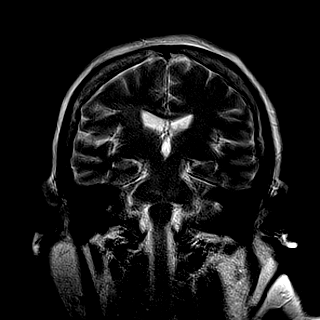
[im 28/28]
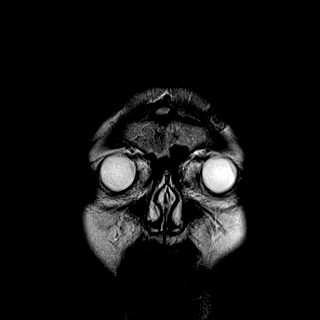

[22 of 48 positions shown; findings below may reference images not displayed]

FINDINGS: Exam is motion degraded.

Artifact extends through medial left thalamus on diffusion sequence.

Left pons tiny area of altered signal intensity [DATE]
shine through versus result of tiny subacute infarct.

Overall no definitive findings of acute infarct.

Post frontal craniotomy for resection of meningioma are per history
provided with surrounding encephalomalacia similar to prior exam.
Contrast was not administered. Taking this limitation into account,
findings appear similar to prior exam.

Prominent white matter type changes of the pons similar to prior
exam.

Mild periventricular white matter type changes stable.

No hydrocephalus.

No obvious intracranial hemorrhage although evaluation limited by
motion.

Major intracranial vascular structures appear patent.

Post lens replacement.  Exophthalmos.

Minimal paranasal sinus mucosal thickening.

Cervical medullary junction, pituitary region and pineal region
unremarkable.
IMPRESSION: Exam is motion degraded.

Left pons tiny area of altered signal intensity [DATE]
shine through versus result of tiny subacute infarct.

Overall no definitive findings of acute infarct.

Post frontal craniotomy for resection of meningioma as per history
provided with surrounding encephalomalacia similar to prior exam.
Contrast was not administered. Taking this limitation into account,
findings appear similar to prior exam.

Prominent white matter type changes of the pons similar to prior
exam.

Mild periventricular white matter type changes stable.

No obvious intracranial hemorrhage although evaluation limited by
motion.

## 2016-08-28 DIAGNOSIS — M5441 Lumbago with sciatica, right side: Secondary | ICD-10-CM | POA: Diagnosis not present

## 2016-08-28 DIAGNOSIS — G894 Chronic pain syndrome: Secondary | ICD-10-CM | POA: Diagnosis not present

## 2016-08-28 DIAGNOSIS — I69393 Ataxia following cerebral infarction: Secondary | ICD-10-CM | POA: Diagnosis not present

## 2016-08-29 ENCOUNTER — Telehealth (HOSPITAL_COMMUNITY): Payer: Self-pay | Admitting: *Deleted

## 2016-08-29 NOTE — Telephone Encounter (Signed)
Called pt and informed her with what provider stated and she resch appt for July 10th

## 2016-08-29 NOTE — Telephone Encounter (Signed)
noted 

## 2016-08-29 NOTE — Telephone Encounter (Signed)
patient is scheduled for 08/30/16.   Per Beth at [facility], she needs to be rescheduled due to not enough time called in for transportation.   she was on Dr. Harrington Challenger schedule for next week, but 08/30/16 was the only thing available.  [name] wants you to call her and let her know if she will be able to get her refills, since transportation will not be able to bring her tomorrow.

## 2016-08-29 NOTE — Telephone Encounter (Signed)
All her meds were filled on 4/11 with 2 refills. She should have enough to last until 7/11

## 2016-08-29 NOTE — Telephone Encounter (Signed)
patient is scheduled for 08/30/16.   Per Beth at [facility], she needs to be rescheduled due to not enough time called in for transportation.   she was on Dr. Harrington Challenger schedule for next week, but 08/30/16 was the only thing available.  [name] wants you to call her and let her know if she will be able to get her refills, since transportation will not be able to bring her tomorrow.

## 2016-08-29 NOTE — Telephone Encounter (Signed)
left voice message regarding appointment for 08/30/16, per transportation person Beth at [facility] to reschedule appointment.

## 2016-08-30 ENCOUNTER — Ambulatory Visit (HOSPITAL_COMMUNITY): Payer: Self-pay | Admitting: Psychiatry

## 2016-08-30 ENCOUNTER — Other Ambulatory Visit (HOSPITAL_COMMUNITY): Payer: Self-pay | Admitting: Psychiatry

## 2016-08-30 ENCOUNTER — Other Ambulatory Visit: Payer: Self-pay | Admitting: Internal Medicine

## 2016-08-30 ENCOUNTER — Other Ambulatory Visit: Payer: Self-pay | Admitting: Neurology

## 2016-08-30 ENCOUNTER — Other Ambulatory Visit: Payer: Self-pay | Admitting: *Deleted

## 2016-08-30 ENCOUNTER — Other Ambulatory Visit: Payer: Self-pay | Admitting: Family Medicine

## 2016-08-30 MED ORDER — LAMOTRIGINE 100 MG PO TABS: 100.0000 mg | ORAL_TABLET | Freq: Two times a day (BID) | ORAL | 5 refills | Status: DC

## 2016-08-31 DIAGNOSIS — M150 Primary generalized (osteo)arthritis: Secondary | ICD-10-CM | POA: Diagnosis not present

## 2016-08-31 DIAGNOSIS — I639 Cerebral infarction, unspecified: Secondary | ICD-10-CM | POA: Diagnosis not present

## 2016-08-31 DIAGNOSIS — R32 Unspecified urinary incontinence: Secondary | ICD-10-CM | POA: Diagnosis not present

## 2016-09-04 ENCOUNTER — Encounter (HOSPITAL_COMMUNITY): Payer: Self-pay | Admitting: Psychiatry

## 2016-09-04 ENCOUNTER — Ambulatory Visit (INDEPENDENT_AMBULATORY_CARE_PROVIDER_SITE_OTHER): Payer: Commercial Managed Care - HMO | Admitting: Psychiatry

## 2016-09-04 ENCOUNTER — Telehealth: Payer: Self-pay | Admitting: *Deleted

## 2016-09-04 DIAGNOSIS — F331 Major depressive disorder, recurrent, moderate: Secondary | ICD-10-CM

## 2016-09-04 NOTE — Telephone Encounter (Signed)
Patient called requesting to speak with [name], Patient states the last 2 times she has called whomever she spoke with left her on hold first time 45 minutes and second time was for 15 minutes. Patient is requesting only for [name] to call her back.

## 2016-09-04 NOTE — Progress Notes (Signed)
Patient:  Merinda Victorino Mcgivern   DOB: 1950-05-18  MR Number: [id number]  Location: [facility]:  [address] [place],   [place]  Start: Monday 09/04/2016 10:16 AM   End:       Monday 09/04/2016 11:05 AM   Provider/Observer:     Maurice Small, MSW, LCSW   Chief Complaint:     Depression  Reason For Service:               Arbell Wycoff Sedgwick is a 66 y.o. female who presents with a long standing history of recurrent periods of depression beginning when she was 36 and her favorite uncle died. She has been experiencing increased symptoms of depression recently due to stress in the relationship with her son. Per patient's report, he rarely visits her anymore due to his involvement with an older woman who is controlling. She also reports frustration regarding her knees who is her power of attorn      ey and is very demanding per      patient's report. She reports issues with other family members and states she needs help dealing with her family Patient reports multiple psychiatric hospitlaizations due to depression and suicidal ideations with thel last one occuring in 1997. Patient has participated in outpatient psychotherapy and medication management intermittently since age 54.  She currently is seeing psychiatrist Dr. Harrington Challenger . Prior to this, she was being seen at [facility]. Patient also has had ECT at [facility].    Interventions Strategy:  Supportive  Participation Level:   Active      Participation Quality:  Appropriate      Behavioral Observation:  Casual, fairly groomed, drowsy, speech very slow   Current Psychosocial Factors: Tension in relationship with son, multiple health issues, concerns about grandson  Content of Session:   reviewed symptoms, praise and reinforced patient's involvement in activity, discussed effects on mood, discussed stressors, discussed concerns and thoughts about grandson, reviewed rationale for and practiced mindfulness breathing, discussed patient's  spirituality and ways to use to help cope  Current Status:   Anger, irritability, worry, anxiety   Suicidal/Homicidal:    No   Patient Progress:   Fair. Patient reports increased irritability and anger since last session. She has continued to have headaches and expresses frustration with her medical providers. She also reports being stressed by getting her apartment building for inspection. She has increased involvement in activity as she and her a packing up items in preparation for carpet being replaced in her apartment. She also reports enjoying attending her grandson's recent high school graduation. However, she continues to express frustration and worry about his future and choices.   Target Goals:   1.  Learn and implement behavioral strategies to overcome depression.    2.  Verbalize an understanding and resolution of current interpersonal problems.   Last Reviewed:   07/18/2016  Goals Addressed Today:    1, 2  Plan:      Return again in 2 - 3 weeks.  Impression/Diagnosis:   Patient presents with long standing history of recurrent periods  of depression beginning when she was [age] and her favorite uncle died. Patient reports multiple psychiatric hospitlaizations due to depression and suicidal ideations with thel last one occuring in 1997. Patient has participated in outpatient psychotherapy and medication management intermittently since age 19.  She currently is seeing psychiatrist Dr. Harrington Challenger . Prior to this, she was being seen at [facility]. Patient also has had ECT at  [facility]. Symptoms have worsened in recent months due to family stress. Current symptoms include depressed mood, anxiety, excessive worry, and tearfulness.   Diagnosis:  Axis I: MDD recurrent episode, moderate          Axis II: Deferred     Alise Calais, LCSW 09/04/2016

## 2016-09-04 NOTE — Telephone Encounter (Signed)
Wants to be referred to another neurologist/ or ent that will help her out with the chronic headaches and nosebleeds. Shes not satisfied with the care she has been getting

## 2016-09-05 ENCOUNTER — Other Ambulatory Visit: Payer: Self-pay | Admitting: Family Medicine

## 2016-09-05 ENCOUNTER — Ambulatory Visit: Payer: Self-pay

## 2016-09-05 DIAGNOSIS — M79609 Pain in unspecified limb: Secondary | ICD-10-CM | POA: Diagnosis not present

## 2016-09-05 DIAGNOSIS — R51 Headache: Principal | ICD-10-CM

## 2016-09-05 DIAGNOSIS — R519 Headache, unspecified: Secondary | ICD-10-CM

## 2016-09-05 NOTE — Progress Notes (Signed)
neurologyu

## 2016-09-05 NOTE — Telephone Encounter (Signed)
Spoke directly the pt and she requests referral to [facility] re headache management as she cries all the time in pain, will refer per her request

## 2016-09-06 ENCOUNTER — Encounter: Payer: Self-pay | Admitting: Internal Medicine

## 2016-09-07 ENCOUNTER — Ambulatory Visit
Admission: RE | Admit: 2016-09-07 | Discharge: 2016-09-07 | Disposition: A | Discharge status: Home / Self Care | Payer: Commercial Managed Care - HMO | Source: Ambulatory Visit | Attending: Family Medicine | Admitting: Family Medicine

## 2016-09-07 ENCOUNTER — Ambulatory Visit (HOSPITAL_COMMUNITY): Payer: Self-pay | Admitting: Psychiatry

## 2016-09-07 DIAGNOSIS — Z1231 Encounter for screening mammogram for malignant neoplasm of breast: Secondary | ICD-10-CM

## 2016-09-10 ENCOUNTER — Emergency Department (HOSPITAL_COMMUNITY): Payer: Medicare HMO

## 2016-09-10 ENCOUNTER — Inpatient Hospital Stay (HOSPITAL_COMMUNITY)
Admission: EM | Admit: 2016-09-10 | Discharge: 2016-09-18 | DRG: 389 | Disposition: A | Discharge status: Home Health | Payer: Medicare HMO | Attending: Internal Medicine | Admitting: Internal Medicine

## 2016-09-10 ENCOUNTER — Inpatient Hospital Stay (HOSPITAL_COMMUNITY): Payer: Medicare HMO

## 2016-09-10 ENCOUNTER — Encounter (HOSPITAL_COMMUNITY): Payer: Self-pay | Admitting: *Deleted

## 2016-09-10 DIAGNOSIS — Z9049 Acquired absence of other specified parts of digestive tract: Secondary | ICD-10-CM | POA: Diagnosis not present

## 2016-09-10 DIAGNOSIS — Z794 Long term (current) use of insulin: Secondary | ICD-10-CM | POA: Diagnosis not present

## 2016-09-10 DIAGNOSIS — Z823 Family history of stroke: Secondary | ICD-10-CM

## 2016-09-10 DIAGNOSIS — R000 Tachycardia, unspecified: Secondary | ICD-10-CM | POA: Diagnosis not present

## 2016-09-10 DIAGNOSIS — E782 Mixed hyperlipidemia: Secondary | ICD-10-CM | POA: Diagnosis present

## 2016-09-10 DIAGNOSIS — R1110 Vomiting, unspecified: Secondary | ICD-10-CM | POA: Diagnosis not present

## 2016-09-10 DIAGNOSIS — I69354 Hemiplegia and hemiparesis following cerebral infarction affecting left non-dominant side: Secondary | ICD-10-CM | POA: Diagnosis not present

## 2016-09-10 DIAGNOSIS — E039 Hypothyroidism, unspecified: Secondary | ICD-10-CM | POA: Diagnosis not present

## 2016-09-10 DIAGNOSIS — E1159 Type 2 diabetes mellitus with other circulatory complications: Secondary | ICD-10-CM | POA: Diagnosis present

## 2016-09-10 DIAGNOSIS — Z90710 Acquired absence of both cervix and uterus: Secondary | ICD-10-CM

## 2016-09-10 DIAGNOSIS — Z833 Family history of diabetes mellitus: Secondary | ICD-10-CM | POA: Diagnosis not present

## 2016-09-10 DIAGNOSIS — E785 Hyperlipidemia, unspecified: Secondary | ICD-10-CM | POA: Diagnosis not present

## 2016-09-10 DIAGNOSIS — F17210 Nicotine dependence, cigarettes, uncomplicated: Secondary | ICD-10-CM | POA: Diagnosis present

## 2016-09-10 DIAGNOSIS — F319 Bipolar disorder, unspecified: Secondary | ICD-10-CM | POA: Diagnosis present

## 2016-09-10 DIAGNOSIS — G4700 Insomnia, unspecified: Secondary | ICD-10-CM | POA: Diagnosis present

## 2016-09-10 DIAGNOSIS — K56609 Unspecified intestinal obstruction, unspecified as to partial versus complete obstruction: Secondary | ICD-10-CM | POA: Diagnosis present

## 2016-09-10 DIAGNOSIS — E1165 Type 2 diabetes mellitus with hyperglycemia: Secondary | ICD-10-CM | POA: Diagnosis present

## 2016-09-10 DIAGNOSIS — Z0189 Encounter for other specified special examinations: Secondary | ICD-10-CM

## 2016-09-10 DIAGNOSIS — R0989 Other specified symptoms and signs involving the circulatory and respiratory systems: Secondary | ICD-10-CM | POA: Diagnosis present

## 2016-09-10 DIAGNOSIS — R109 Unspecified abdominal pain: Secondary | ICD-10-CM | POA: Diagnosis not present

## 2016-09-10 DIAGNOSIS — Z8249 Family history of ischemic heart disease and other diseases of the circulatory system: Secondary | ICD-10-CM

## 2016-09-10 DIAGNOSIS — R110 Nausea: Secondary | ICD-10-CM | POA: Diagnosis not present

## 2016-09-10 DIAGNOSIS — Z9119 Patient's noncompliance with other medical treatment and regimen: Secondary | ICD-10-CM

## 2016-09-10 DIAGNOSIS — K219 Gastro-esophageal reflux disease without esophagitis: Secondary | ICD-10-CM | POA: Diagnosis present

## 2016-09-10 DIAGNOSIS — M810 Age-related osteoporosis without current pathological fracture: Secondary | ICD-10-CM | POA: Diagnosis present

## 2016-09-10 DIAGNOSIS — H409 Unspecified glaucoma: Secondary | ICD-10-CM | POA: Diagnosis present

## 2016-09-10 DIAGNOSIS — M797 Fibromyalgia: Secondary | ICD-10-CM | POA: Diagnosis present

## 2016-09-10 DIAGNOSIS — J449 Chronic obstructive pulmonary disease, unspecified: Secondary | ICD-10-CM | POA: Diagnosis not present

## 2016-09-10 DIAGNOSIS — D72829 Elevated white blood cell count, unspecified: Secondary | ICD-10-CM | POA: Diagnosis not present

## 2016-09-10 DIAGNOSIS — G4733 Obstructive sleep apnea (adult) (pediatric): Secondary | ICD-10-CM | POA: Diagnosis present

## 2016-09-10 DIAGNOSIS — K5669 Other partial intestinal obstruction: Secondary | ICD-10-CM | POA: Diagnosis not present

## 2016-09-10 DIAGNOSIS — G8929 Other chronic pain: Secondary | ICD-10-CM | POA: Diagnosis not present

## 2016-09-10 DIAGNOSIS — K56699 Other intestinal obstruction unspecified as to partial versus complete obstruction: Secondary | ICD-10-CM | POA: Diagnosis not present

## 2016-09-10 DIAGNOSIS — K56600 Partial intestinal obstruction, unspecified as to cause: Secondary | ICD-10-CM | POA: Diagnosis not present

## 2016-09-10 DIAGNOSIS — Z452 Encounter for adjustment and management of vascular access device: Secondary | ICD-10-CM | POA: Diagnosis not present

## 2016-09-10 DIAGNOSIS — F39 Unspecified mood [affective] disorder: Secondary | ICD-10-CM | POA: Diagnosis present

## 2016-09-10 DIAGNOSIS — R10817 Generalized abdominal tenderness: Secondary | ICD-10-CM | POA: Diagnosis not present

## 2016-09-10 DIAGNOSIS — R112 Nausea with vomiting, unspecified: Secondary | ICD-10-CM

## 2016-09-10 DIAGNOSIS — K2970 Gastritis, unspecified, without bleeding: Secondary | ICD-10-CM | POA: Diagnosis not present

## 2016-09-10 DIAGNOSIS — I10 Essential (primary) hypertension: Secondary | ICD-10-CM | POA: Diagnosis present

## 2016-09-10 DIAGNOSIS — E739 Lactose intolerance, unspecified: Secondary | ICD-10-CM | POA: Diagnosis not present

## 2016-09-10 DIAGNOSIS — Z7982 Long term (current) use of aspirin: Secondary | ICD-10-CM

## 2016-09-10 LAB — CBC WITH DIFFERENTIAL/PLATELET
Basophils Absolute: 0.1 K/uL (ref 0.0–0.1)
Basophils Relative: 1 %
Eosinophils Absolute: 0.1 K/uL (ref 0.0–0.7)
Eosinophils Relative: 1 %
HCT: 41.5 % (ref 36.0–46.0)
Hemoglobin: 12.6 g/dL (ref 12.0–15.0)
Lymphocytes Relative: 12 %
Lymphs Abs: 1 K/uL (ref 0.7–4.0)
MCH: 22.3 pg — ABNORMAL LOW (ref 26.0–34.0)
MCHC: 30.4 g/dL (ref 30.0–36.0)
MCV: 73.3 fL — ABNORMAL LOW (ref 78.0–100.0)
Monocytes Absolute: 0.3 K/uL (ref 0.1–1.0)
Monocytes Relative: 4 %
Neutro Abs: 6.9 K/uL (ref 1.7–7.7)
Neutrophils Relative %: 82 %
Platelets: 272 K/uL (ref 150–400)
RBC: 5.66 MIL/uL — ABNORMAL HIGH (ref 3.87–5.11)
RDW: 14.9 % (ref 11.5–15.5)
WBC: 8.4 K/uL (ref 4.0–10.5)

## 2016-09-10 LAB — COMPREHENSIVE METABOLIC PANEL
ALT: 28 U/L (ref 14–54)
AST: 29 U/L (ref 15–41)
Albumin: 4.2 g/dL (ref 3.5–5.0)
Alkaline Phosphatase: 127 U/L — ABNORMAL HIGH (ref 38–126)
Anion gap: 10 (ref 5–15)
BILIRUBIN TOTAL: 0.6 mg/dL (ref 0.3–1.2)
BUN: 18 mg/dL (ref 6–20)
CO2: 27 mmol/L (ref 22–32)
Calcium: 9.8 mg/dL (ref 8.9–10.3)
Chloride: 105 mmol/L (ref 101–111)
Creatinine, Ser: 0.93 mg/dL (ref 0.44–1.00)
GFR calc Af Amer: >60 mL/min (ref >60)
Glucose, Bld: 262 mg/dL — ABNORMAL HIGH (ref 65–99)
POTASSIUM: 3.8 mmol/L (ref 3.5–5.1)
Sodium: 142 mmol/L (ref 135–145)
TOTAL PROTEIN: 8.1 g/dL (ref 6.5–8.1)

## 2016-09-10 LAB — GLUCOSE, CAPILLARY
GLUCOSE-CAPILLARY: 189 mg/dL — AB (ref 65–99)
Glucose-Capillary: 158 mg/dL — ABNORMAL HIGH (ref 65–99)
Glucose-Capillary: 142 mg/dL — ABNORMAL HIGH (ref 65–99)

## 2016-09-10 LAB — URINALYSIS, ROUTINE W REFLEX MICROSCOPIC
Bilirubin Urine: NEGATIVE
Glucose, UA: >=500 mg/dL — AB
Hgb urine dipstick: NEGATIVE
Ketones, ur: NEGATIVE mg/dL
Leukocytes, UA: NEGATIVE
Nitrite: NEGATIVE
Protein, ur: 30 mg/dL — AB
RBC / HPF: NONE SEEN RBC/hpf (ref 0–5)
Specific Gravity, Urine: 1.01 (ref 1.005–1.030)
pH: 7 (ref 5.0–8.0)

## 2016-09-10 LAB — LIPASE, BLOOD: Lipase: 23 U/L (ref 11–51)

## 2016-09-10 MED ORDER — CIPROFLOXACIN IN D5W 400 MG/200ML IV SOLN
400.0000 mg | Freq: Two times a day (BID) | INTRAVENOUS | Status: DC
  Administered 2016-09-10 – 2016-09-11 (×2): 400 mg via INTRAVENOUS
  Filled 2016-09-10 (×2): qty 200

## 2016-09-10 MED ORDER — METRONIDAZOLE IN NACL 5-0.79 MG/ML-% IV SOLN
500.0000 mg | Freq: Once | INTRAVENOUS | Status: AC
  Administered 2016-09-10: 500 mg via INTRAVENOUS
  Filled 2016-09-10: qty 100

## 2016-09-10 MED ORDER — ACETAMINOPHEN 325 MG PO TABS: 650.0000 mg | ORAL_TABLET | Freq: Four times a day (QID) | ORAL | Status: DC | PRN

## 2016-09-10 MED ORDER — METOPROLOL TARTRATE 5 MG/5ML IV SOLN
5.0000 mg | Freq: Four times a day (QID) | INTRAVENOUS | Status: DC
  Administered 2016-09-10 – 2016-09-14 (×16): 5 mg via INTRAVENOUS
  Filled 2016-09-10 (×15): qty 5

## 2016-09-10 MED ORDER — MORPHINE SULFATE (PF) 2 MG/ML IV SOLN
2.0000 mg | INTRAVENOUS | Status: DC | PRN
  Administered 2016-09-10 – 2016-09-16 (×11): 2 mg via INTRAVENOUS
  Filled 2016-09-10 (×11): qty 1

## 2016-09-10 MED ORDER — AZELASTINE-FLUTICASONE 137-50 MCG/ACT NA SUSP: 1.0000 | Freq: Every day | NASAL | Status: DC

## 2016-09-10 MED ORDER — SODIUM CHLORIDE 0.9 % IV BOLUS (SEPSIS)
500.0000 mL | Freq: Once | INTRAVENOUS | Status: AC
  Administered 2016-09-10: 500 mL via INTRAVENOUS

## 2016-09-10 MED ORDER — FENTANYL CITRATE (PF) 100 MCG/2ML IJ SOLN
50.0000 ug | Freq: Once | INTRAMUSCULAR | Status: AC
  Administered 2016-09-10: 50 ug via INTRAVENOUS
  Filled 2016-09-10: qty 2

## 2016-09-10 MED ORDER — PANTOPRAZOLE SODIUM 40 MG IV SOLR
40.0000 mg | INTRAVENOUS | Status: DC
  Administered 2016-09-10 – 2016-09-13 (×4): 40 mg via INTRAVENOUS
  Filled 2016-09-10 (×4): qty 40

## 2016-09-10 MED ORDER — HYDRALAZINE HCL 20 MG/ML IJ SOLN
10.0000 mg | Freq: Four times a day (QID) | INTRAMUSCULAR | Status: DC | PRN
  Administered 2016-09-11: 20 mg via INTRAVENOUS
  Administered 2016-09-13 – 2016-09-14 (×2): 10 mg via INTRAVENOUS
  Filled 2016-09-10 (×3): qty 1

## 2016-09-10 MED ORDER — METRONIDAZOLE IN NACL 5-0.79 MG/ML-% IV SOLN
500.0000 mg | Freq: Three times a day (TID) | INTRAVENOUS | Status: DC
  Administered 2016-09-10 – 2016-09-11 (×3): 500 mg via INTRAVENOUS
  Filled 2016-09-10 (×3): qty 100

## 2016-09-10 MED ORDER — CLOBETASOL PROPIONATE 0.05 % EX OINT
1.0000 "application " | TOPICAL_OINTMENT | Freq: Every day | CUTANEOUS | Status: DC
  Administered 2016-09-11 – 2016-09-18 (×7): 1 via TOPICAL
  Filled 2016-09-10: qty 15

## 2016-09-10 MED ORDER — IPRATROPIUM-ALBUTEROL 0.5-2.5 (3) MG/3ML IN SOLN
3.0000 mL | Freq: Three times a day (TID) | RESPIRATORY_TRACT | Status: DC
  Administered 2016-09-10 – 2016-09-14 (×11): 3 mL via RESPIRATORY_TRACT
  Filled 2016-09-10 (×11): qty 3

## 2016-09-10 MED ORDER — IOPAMIDOL (ISOVUE-300) INJECTION 61%
100.0000 mL | Freq: Once | INTRAVENOUS | Status: AC | PRN
  Administered 2016-09-10: 100 mL via INTRAVENOUS

## 2016-09-10 MED ORDER — INSULIN GLARGINE 100 UNIT/ML ~~LOC~~ SOLN
10.0000 [IU] | Freq: Every day | SUBCUTANEOUS | Status: DC
  Administered 2016-09-10 – 2016-09-17 (×8): 10 [IU] via SUBCUTANEOUS
  Filled 2016-09-10 (×9): qty 0.1

## 2016-09-10 MED ORDER — IPRATROPIUM-ALBUTEROL 0.5-2.5 (3) MG/3ML IN SOLN
3.0000 mL | Freq: Four times a day (QID) | RESPIRATORY_TRACT | Status: DC
  Administered 2016-09-10: 3 mL via RESPIRATORY_TRACT
  Filled 2016-09-10: qty 3

## 2016-09-10 MED ORDER — ONDANSETRON HCL 4 MG/2ML IJ SOLN
4.0000 mg | Freq: Once | INTRAMUSCULAR | Status: AC
  Administered 2016-09-10: 4 mg via INTRAVENOUS
  Filled 2016-09-10: qty 2

## 2016-09-10 MED ORDER — ACETAMINOPHEN 650 MG RE SUPP: 650.0000 mg | Freq: Four times a day (QID) | RECTAL | Status: DC | PRN

## 2016-09-10 MED ORDER — ONDANSETRON HCL 4 MG/2ML IJ SOLN
4.0000 mg | Freq: Four times a day (QID) | INTRAMUSCULAR | Status: DC | PRN
  Administered 2016-09-10 – 2016-09-17 (×8): 4 mg via INTRAVENOUS
  Filled 2016-09-10 (×8): qty 2

## 2016-09-10 MED ORDER — ALBUTEROL SULFATE (2.5 MG/3ML) 0.083% IN NEBU: 2.5000 mg | INHALATION_SOLUTION | RESPIRATORY_TRACT | Status: DC | PRN

## 2016-09-10 MED ORDER — ONDANSETRON HCL 4 MG PO TABS
4.0000 mg | ORAL_TABLET | Freq: Four times a day (QID) | ORAL | Status: DC | PRN
  Administered 2016-09-18: 4 mg via ORAL
  Filled 2016-09-10: qty 1

## 2016-09-10 MED ORDER — INSULIN ASPART 100 UNIT/ML ~~LOC~~ SOLN
0.0000 [IU] | SUBCUTANEOUS | Status: DC
  Administered 2016-09-10: 2 [IU] via SUBCUTANEOUS
  Administered 2016-09-10 – 2016-09-11 (×3): 3 [IU] via SUBCUTANEOUS
  Administered 2016-09-11: 2 [IU] via SUBCUTANEOUS
  Administered 2016-09-11: 3 [IU] via SUBCUTANEOUS
  Administered 2016-09-11 – 2016-09-12 (×2): 2 [IU] via SUBCUTANEOUS
  Administered 2016-09-12 (×2): 3 [IU] via SUBCUTANEOUS
  Administered 2016-09-12: 2 [IU] via SUBCUTANEOUS
  Administered 2016-09-12 – 2016-09-13 (×5): 3 [IU] via SUBCUTANEOUS
  Administered 2016-09-13 – 2016-09-14 (×4): 2 [IU] via SUBCUTANEOUS
  Administered 2016-09-14 (×2): 3 [IU] via SUBCUTANEOUS
  Administered 2016-09-14: 2 [IU] via SUBCUTANEOUS
  Administered 2016-09-14 (×2): 3 [IU] via SUBCUTANEOUS
  Administered 2016-09-15: 2 [IU] via SUBCUTANEOUS
  Administered 2016-09-15 (×2): 3 [IU] via SUBCUTANEOUS
  Administered 2016-09-15 (×3): 2 [IU] via SUBCUTANEOUS
  Administered 2016-09-16: 3 [IU] via SUBCUTANEOUS
  Administered 2016-09-16 (×2): 2 [IU] via SUBCUTANEOUS
  Administered 2016-09-16 – 2016-09-17 (×4): 3 [IU] via SUBCUTANEOUS
  Administered 2016-09-17: 2 [IU] via SUBCUTANEOUS
  Administered 2016-09-17: 3 [IU] via SUBCUTANEOUS
  Administered 2016-09-18: 2 [IU] via SUBCUTANEOUS
  Administered 2016-09-18 (×2): 3 [IU] via SUBCUTANEOUS
  Administered 2016-09-18: 2 [IU] via SUBCUTANEOUS

## 2016-09-10 MED ORDER — CYCLOSPORINE 0.05 % OP EMUL
1.0000 [drp] | Freq: Two times a day (BID) | OPHTHALMIC | Status: DC
  Administered 2016-09-10 – 2016-09-18 (×16): 1 [drp] via OPHTHALMIC
  Filled 2016-09-10 (×16): qty 1

## 2016-09-10 MED ORDER — SODIUM CHLORIDE 0.9 % IV BOLUS (SEPSIS)
1000.0000 mL | Freq: Once | INTRAVENOUS | Status: AC
  Administered 2016-09-10: 1000 mL via INTRAVENOUS

## 2016-09-10 MED ORDER — ENOXAPARIN SODIUM 40 MG/0.4ML ~~LOC~~ SOLN
40.0000 mg | SUBCUTANEOUS | Status: DC
  Administered 2016-09-10 – 2016-09-17 (×8): 40 mg via SUBCUTANEOUS
  Filled 2016-09-10 (×9): qty 0.4

## 2016-09-10 MED ORDER — ONDANSETRON 4 MG PO TBDP
4.0000 mg | ORAL_TABLET | Freq: Once | ORAL | Status: AC
  Administered 2016-09-10: 4 mg via ORAL
  Filled 2016-09-10: qty 1

## 2016-09-10 MED ORDER — POTASSIUM CHLORIDE IN NACL 20-0.9 MEQ/L-% IV SOLN
INTRAVENOUS | Status: DC
  Administered 2016-09-10 – 2016-09-14 (×4): via INTRAVENOUS

## 2016-09-10 MED ORDER — CIPROFLOXACIN IN D5W 400 MG/200ML IV SOLN
400.0000 mg | Freq: Once | INTRAVENOUS | Status: AC
  Administered 2016-09-10: 400 mg via INTRAVENOUS
  Filled 2016-09-10: qty 200

## 2016-09-10 NOTE — ED Triage Notes (Signed)
Pt c/o abd pain with n/v that started tonight,

## 2016-09-10 NOTE — H&P (Signed)
History and Physical    [name] YHC:[id number] DOB: 10-05-1950 DOA: 09/10/2016  PCP: [name], MD  Patient coming from: home  I have personally briefly reviewed patient's old medical records in [place]  Chief Complaint: abdominal pain  HPI: [name] is a 66 y.o. female with medical history significant of COPD, bipolar disorder, hypertension, diabetes. Patient presents to the hospital with abdominal pain. Reports onset of symptoms yesterday with associated vomiting. She had a formed bowel movement prior to arrival, but has not passed any flatus in the past several days. No dysuria. She has noticed that her abdomen is more distended. She is not having shortness of breath, chest pain, fever.  ED Course: Patient was evaluated in emergency room a CT scan indicated high-grade small bowel obstruction. Lab work was otherwise unremarkable. She was noted to be hypertensive. She is now referred for admission.  Review of Systems: As per HPI otherwise 10 point review of systems negative.    Past Medical History:  Diagnosis Date  . Anemia   . Anxiety    takes Ativan daily  . Arthritis   . Bipolar disorder ([place])    takes Risperdal nightly  . Blood transfusion   . Cancer ([place])    In her gum  . Carpal tunnel syndrome of right wrist 05/23/2011  . Cervical disc disorder with radiculopathy of cervical region 10/31/2012  . Chronic back pain   . Chronic idiopathic constipation   . Colon polyps   . COPD (chronic obstructive pulmonary disease) with chronic bronchitis ([place]) 09/16/2013   Office Spirometry 10/30/2013-submaximal effort based on appearance of loop and curve. Numbers would fit with severe restriction but her physiologic capability may be better than this. FVC 0.91/44%, and 10.74/45%, FEV1/FVC 0.81, FEF 25-75% 1.43/69%    . Depression    takes Zoloft daily  . Diabetes mellitus    Type II  . Diverticulosis    TCS 9/08 by Dr. Delfin Edis for diarrhea . Bx for micro  scopic colitis negative.   . Fibromyalgia   . Frequent falls   . GERD (gastroesophageal reflux disease)    takes Aciphex daily  . Glaucoma    eye drops daily  . Gum symptoms    infection on antibiotic  . Hemiplegia affecting non-dominant side, post-stroke 08/02/2011  . Hyperlipidemia    takes Crestor daily  . Hypertension    takes Amlodipine,Metoprolol,and Clonidine daily  . Hypothyroidism    takes Synthroid daily  . IBS (irritable bowel syndrome)   . Insomnia    takes Trazodone nightly  . Metabolic encephalopathy 07/28/6158  . Migraines    chronic headaches  . Mononeuritis lower limb   . Osteoporosis   . Pancreatitis 2006   due to Depakote with normal EUS   . Schatzki's ring    non critical / EGD with ED 8/2011with RMR  . Seizures ([place])    takes Lamictal daily.Last seizure 3 yrs ago  . Sleep apnea    on CPAP  . Stroke [facility])    left sided weakness  . Tubular adenoma of colon     Past Surgical History:  Procedure Laterality Date  . ABDOMINAL HYSTERECTOMY  1978  . BACK SURGERY  July 2012  . BACTERIAL OVERGROWTH TEST N/A 05/05/2013   Procedure: BACTERIAL OVERGROWTH TEST;  Surgeon: Daneil Dolin, MD;  [facility];  Service: Endoscopy;  Laterality: N/A;  7:30  . BIOPSY THYROID  2009  . BRAIN SURGERY  11/2011  resection of meningioma  . BREAST REDUCTION SURGERY  1994  . CARDIAC CATHETERIZATION  05/10/2005   normal coronaries, normal LV systolic function and EF (Dr. Jackie Plum)  . CARPAL TUNNEL RELEASE Left 07/22/04   Dr. Aline Brochure  . CATARACT EXTRACTION Bilateral   . CHOLECYSTECTOMY  1984  . COLONOSCOPY N/A 09/25/2012   KVQ:[id number] diverticulosis.  colonic polyp-removed : tubular adenoma  . CRANIOTOMY  11/23/2011   Procedure: CRANIOTOMY TUMOR EXCISION;  Surgeon: Hosie Spangle, MD;  Location: [place] NEURO ORS;  Service: Neurosurgery;  Laterality: N/A;  Craniotomy for tumor resection  . ESOPHAGOGASTRODUODENOSCOPY  12/29/2010   Rourk-Retained food in the esophagus  and stomach, small hiatal hernia, status post Maloney dilation of the esophagus  . ESOPHAGOGASTRODUODENOSCOPY N/A 09/25/2012   VFI:EPPIRJJO atonic baggy esophagus status post Maloney dilation 74 F. Hiatal hernia  . GIVENS CAPSULE STUDY N/A 01/15/2013   NORMAL.   . IR GENERIC HISTORICAL  03/17/2016   IR RADIOLOGIST EVAL & MGMT 03/17/2016 [facility]-INTERV RAD  . LESION REMOVAL N/A 05/31/2015   Procedure: REMOVAL RIGHT AND LEFT LESIONS OF MANDIBLE;  Surgeon: Diona Browner, DDS;  Location: [place];  Service: Oral Surgery;  Laterality: N/A;  . MALONEY DILATION  12/29/2010   RMR;  . NM MYOCAR PERF WALL MOTION  2006   "relavtiely normal" persantine, mild anterior thinning (breast attenuation artifact), no region of scar/ischemia  . OVARIAN CYST REMOVAL    . RECTOCELE REPAIR N/A 06/29/2015   Procedure: POSTERIOR REPAIR (RECTOCELE);  Surgeon: Jonnie Kind, MD;  Location: [facility];  Service: Gynecology;  Laterality: N/A;  . REDUCTION MAMMAPLASTY Bilateral   . SPINE SURGERY  09/29/2010   Dr. Rolena Infante  . surgical excision of 3 tumors from right thigh and right buttock  and left upper thigh  2010  . TOOTH EXTRACTION Bilateral 12/14/2014   Procedure: REMOVAL OF BILATERAL MANDIBULAR EXOSTOSES;  Surgeon: Diona Browner, DDS;  Location: [place];  Service: Oral Surgery;  Laterality: Bilateral;  . TRANSTHORACIC ECHOCARDIOGRAM  2010   EF 60-65%, mild conc LVH, grade 1 diastolic dysfunction; mildly calcified MV annulus with mildly thickened leaflets, mildly calcified MR annulus     reports that she has been smoking Cigarettes.  She has a 1.75 pack-year smoking history. She has never used smokeless tobacco. She reports that she does not drink alcohol or use drugs.  Allergies  Allergen Reactions  . Cephalexin Hives  . Iron Nausea And Vomiting  . Milk-Related Compounds Other (See Comments)    Doesn't agree with stomach.   . Penicillins Hives    Has patient had a PCN reaction causing immediate rash, facial/tongue/throat swelling,  SOB or lightheadedness with hypotension: Yes Has patient had a PCN reaction causing severe rash involving mucus membranes or skin necrosis: No Has patient had a PCN reaction that required hospitalization No Has patient had a PCN reaction occurring within the last 10 years: No If all of the above answers are "NO", then may proceed with Cephalosporin use.   [name] Phenazopyridine Hcl Hives          Family History  Problem Relation Age of Onset  . Heart attack Mother        HTN  . Pneumonia Father   . Kidney failure Father   . Diabetes Father   . Pancreatic cancer Sister   . Diabetes Brother   . Hypertension Brother   . Diabetes Brother   . Cancer Sister        breast   . Hypertension Son   .  Sleep apnea Son   . Cancer Sister        pancreatic  . Stroke Maternal Grandmother   . Heart attack Maternal Grandfather   . Alcohol abuse Maternal Uncle   . Colon cancer Neg Hx   . Anesthesia problems Neg Hx   . Hypotension Neg Hx   . Malignant hyperthermia Neg Hx   . Pseudochol deficiency Neg Hx      Prior to Admission medications   Medication Sig Start Date End Date Taking? Authorizing Provider  ACCU-CHEK FASTCLIX LANCETS MISC Use to test blood sugar 4 times daily. Dx: E10.65 12/03/14  Yes Philemon Kingdom, MD  ACCU-CHEK SMARTVIEW test strip TEST BLOOD SUGAR FOUR TIMES DAILY AS DIRECTED 01/10/16  Yes Philemon Kingdom, MD  albuterol (PROVENTIL HFA;VENTOLIN HFA) 108 (90 Base) MCG/ACT inhaler Inhale 1-2 puffs every 6 hours as needed for wheezing, shortness of breath 07/27/16  Yes Young, Tarri Fuller D, MD  Alcohol Swabs (B-D SINGLE USE SWABS REGULAR) PADS Use for injections and glucose testing 8 times daily. Dx: E10.65 12/03/14  Yes Philemon Kingdom, MD  amLODipine (NORVASC) 5 MG tablet TAKE 2 TABLETS BY MOUTH ONCE DAILY. 08/30/16  Yes Philemon Kingdom, MD  aspirin EC 81 MG tablet Take 81 mg by mouth daily.   Yes [provider]  Azelastine-Fluticasone (DYMISTA) 137-50 MCG/ACT SUSP  Place 1 puff into the nose at bedtime. 07/27/15  Yes Young, Tarri Fuller D, MD  BD PEN NEEDLE NANO U/F 32G X 4 MM MISC USE FOUR TIMES DAILY 04/20/15  Yes Philemon Kingdom, MD  Blood Glucose Calibration (ACCU-CHEK SMARTVIEW CONTROL) LIQD 1 each by Other route as needed. 12/03/14  Yes Philemon Kingdom, MD  Blood Glucose Monitoring Suppl (ACCU-CHEK NANO SMARTVIEW) W/DEVICE KIT 1 each by Does not apply route daily. Dx: E10.65 12/03/14  Yes Philemon Kingdom, MD  Cholecalciferol (VITAMIN D) 2000 units CAPS Take 1 capsule by mouth daily.   Yes [provider]  clobetasol cream (TEMOVATE) 8.84 % Apply 1 application topically daily.   Yes [provider]  cloNIDine (CATAPRES) 0.3 MG tablet TAKE 1 TABLET BY MOUTH EVERY EIGHT HOURS. ONCE AT 8AM, 4PM, AND 12 MIDNIGHT. 08/30/16  Yes [name], MD  diclofenac sodium (VOLTAREN) 1 % GEL Apply 2 g topically daily as needed (Pain). 07/21/16  Yes [name], MD  dicyclomine (BENTYL) 10 MG capsule TAKE 1 CAPSULE BY MOUTH THREE TIMES DAILY BEFORE MEALS. 05/23/16  Yes [name], MD  hydroxychloroquine (PLAQUENIL) 200 MG tablet Take 200 mg by mouth daily.   Yes [provider]  Insulin Glargine (TOUJEO SOLOSTAR) 300 UNIT/ML SOPN Inject 25 Units into the skin at bedtime. 06/27/16  Yes Philemon Kingdom, MD  lamoTRIgine (LAMICTAL) 100 MG tablet Take 1 tablet (100 mg total) by mouth 2 (two) times daily. 08/30/16  Yes Jaffe, Adam R, DO  levothyroxine (SYNTHROID, LEVOTHROID) 50 MCG tablet TAKE 1 TABLET BY MOUTH ONCE DAILY AND 1/2 TABLET ON SUNDAYS. 05/02/16  Yes Philemon Kingdom, MD  LORazepam (ATIVAN) 0.5 MG tablet Take 1 tablet (0.5 mg total) by mouth 3 (three) times daily. 06/21/16  Yes Cloria Spring, MD  losartan (COZAAR) 50 MG tablet TAKE ONE TABLET BY MOUTH DAILY. 07/04/16  Yes [name], MD  metoprolol (LOPRESSOR) 50 MG tablet TAKE 1 TABLET BY MOUTH TWICE DAILY. 11/10/15  Yes Hilty, Nadean Corwin, MD  montelukast  (SINGULAIR) 10 MG tablet TAKE ONE TABLET BY MOUTH ONCE DAILY. 08/30/16  Yes [name], MD  Multiple Vitamins-Minerals (ONE-A-DAY  50 PLUS PO) Take 1 tablet by mouth daily.   Yes [provider]  NOVOLOG FLEXPEN 100 UNIT/ML FlexPen INJECT 16 TO 30 UNITS INTO THE SKIN 3 (THREE) TIMES DAILY WITH MEALS. 01/06/16  Yes Philemon Kingdom, MD  nystatin (MYCOSTATIN/NYSTOP) powder APPLY TO AFFECTED AREA FOUR TIMES DAILY. 04/03/16  Yes [name], MD  pregabalin (LYRICA) 75 MG capsule Take 75 mg by mouth 2 (two) times daily.  10/26/11  Yes Carole Civil, MD  RABEprazole (ACIPHEX) 20 MG tablet Take 1 tablet (20 mg total) by mouth 2 (two) times daily. 06/12/16  Yes Pyrtle, Lajuan Lines, MD  RESTASIS 0.05 % ophthalmic emulsion Place 1 drop into both eyes 2 (two) times daily.  02/04/14  Yes [provider]  Riboflavin 400 MG TABS Take 400 mg by mouth daily. 08/09/16  Yes Jaffe, Adam R, DO  risperiDONE (RISPERDAL) 0.5 MG tablet Take 1 tablet (0.5 mg total) by mouth at bedtime. 06/21/16  Yes Cloria Spring, MD  rosuvastatin (CRESTOR) 5 MG tablet TAKE 1 TABLET BY MOUTH AT BEDTIME. 08/30/16  Yes [name], MD  sertraline (ZOLOFT) 100 MG tablet Take 2 tablets (200 mg total) by mouth daily. 06/21/16  Yes Cloria Spring, MD  traZODone (DESYREL) 150 MG tablet Take 1 tablet (150 mg total) by mouth at bedtime. 06/21/16  Yes Cloria Spring, MD  vitamin B-12 (CYANOCOBALAMIN) 1000 MCG tablet Take 1,000 mcg by mouth daily.   Yes [provider]  vitamin E 400 UNIT capsule Take 400 Units by mouth daily.   Yes [provider]  Coenzyme Q-10 100 MG capsule Take 1 capsule (100 mg total) by mouth 3 (three) times daily. 08/09/16   Pieter Partridge, DO  Magnesium Citrate 200 MG TABS Take 400 mg by mouth daily. 08/09/16   Pieter Partridge, DO  methocarbamol (ROBAXIN) 500 MG tablet Take 500 mg by mouth daily. 12/21/14   [provider]  ondansetron (ZOFRAN) 4 MG tablet Take 1  tablet (4 mg total) by mouth every 8 (eight) hours as needed for nausea or vomiting. 12/29/15   [name], MD    Physical Exam: Vitals:   09/10/16 0423 09/10/16 0427  BP:  (!) 189/74  Pulse:  84  Resp:  18  Temp:  98.7 F (37.1 C)  TempSrc:  Oral  SpO2:  100%  Weight: 71.2 kg (157 lb)   Height: 4' 11" (1.499 m)     Constitutional: NAD, calm, comfortable Vitals:   09/10/16 0423 09/10/16 0427  BP:  (!) 189/74  Pulse:  84  Resp:  18  Temp:  98.7 F (37.1 C)  TempSrc:  Oral  SpO2:  100%  Weight: 71.2 kg (157 lb)   Height: 4' 11" (1.499 m)    Eyes: PERRL, lids and conjunctivae normal ENMT: Mucous membranes are moist. Posterior pharynx clear of any exudate or lesions.Normal dentition.  Neck: normal, supple, no masses, no thyromegaly Respiratory: mild wheeze bilaterally. Normal respiratory effort. No accessory muscle use.  Cardiovascular: Regular rate and rhythm, no murmurs / rubs / gallops. No extremity edema. 2+ pedal pulses. No carotid bruits.  Abdomen: distended, diffusely tender, no masses palpated. No hepatosplenomegaly. Bowel sounds positive.  Musculoskeletal: no clubbing / cyanosis. No joint deformity upper and lower extremities. Good ROM, no contractures. Normal muscle tone.  Skin: no rashes, lesions, ulcers. No induration Neurologic: CN 2-12 grossly intact. Sensation intact, DTR normal. Strength 5/5 in all 4.  Psychiatric: Normal judgment and insight.  Alert and oriented x 3. Normal mood.   Labs on Admission: I have personally reviewed following labs and imaging studies  CBC:  Recent Labs Lab 09/10/16 0503  WBC 8.4  NEUTROABS 6.9  HGB 12.6  HCT 41.5  MCV 73.3*  PLT 163   Basic Metabolic Panel:  Recent Labs Lab 09/10/16 0503  NA 142  K 3.8  CL 105  CO2 27  GLUCOSE 262*  BUN 18  CREATININE 0.93  CALCIUM 9.8   GFR: Estimated Creatinine Clearance: 51.1 mL/min (by C-G formula based on SCr of 0.93 mg/dL). Liver Function Tests:  Recent  Labs Lab 09/10/16 0503  AST 29  ALT 28  ALKPHOS 127*  BILITOT 0.6  PROT 8.1  ALBUMIN 4.2    Recent Labs Lab 09/10/16 0503  LIPASE 23   No results for input(s): AMMONIA in the last 168 hours. Coagulation Profile: No results for input(s): INR, PROTIME in the last 168 hours. Cardiac Enzymes: No results for input(s): CKTOTAL, CKMB, CKMBINDEX, TROPONINI in the last 168 hours. BNP (last 3 results) No results for input(s): PROBNP in the last 8760 hours. HbA1C: No results for input(s): HGBA1C in the last 72 hours. CBG: No results for input(s): GLUCAP in the last 168 hours. Lipid Profile: No results for input(s): CHOL, HDL, LDLCALC, TRIG, CHOLHDL, LDLDIRECT in the last 72 hours. Thyroid Function Tests: No results for input(s): TSH, T4TOTAL, FREET4, T3FREE, THYROIDAB in the last 72 hours. Anemia Panel: No results for input(s): VITAMINB12, FOLATE, FERRITIN, TIBC, IRON, RETICCTPCT in the last 72 hours. Urine analysis:    Component Value Date/Time   COLORURINE STRAW (A) 09/10/2016 0535   APPEARANCEUR CLEAR 09/10/2016 0535   LABSPEC 1.010 09/10/2016 0535   PHURINE 7.0 09/10/2016 0535   GLUCOSEU >=500 (A) 09/10/2016 0535   HGBUR NEGATIVE 09/10/2016 0535   HGBUR negative 03/29/2010 0834   BILIRUBINUR NEGATIVE 09/10/2016 0535   BILIRUBINUR negative 10/25/2015 1044   KETONESUR NEGATIVE 09/10/2016 0535   PROTEINUR 30 (A) 09/10/2016 0535   UROBILINOGEN 0.2 10/25/2015 1044   UROBILINOGEN 0.2 06/02/2014 0140   NITRITE NEGATIVE 09/10/2016 0535   LEUKOCYTESUR NEGATIVE 09/10/2016 0535    Radiological Exams on Admission: Ct Abdomen Pelvis W Contrast  Result Date: 09/10/2016 CLINICAL DATA:  History of oral carcinoma. Abdominal pain with nausea and vomiting beginning last night. EXAM: CT ABDOMEN AND PELVIS WITH CONTRAST TECHNIQUE: Multidetector CT imaging of the abdomen and pelvis was performed using the standard protocol following bolus administration of intravenous contrast. CONTRAST:   156m ISOVUE-300 IOPAMIDOL (ISOVUE-300) INJECTION 61% COMPARISON:  05/14/2016 FINDINGS: Lower chest: Mild basilar atelectasis. No pleural or pericardial fluid. Hepatobiliary: No liver parenchymal lesion. Previous cholecystectomy. Pancreas: Normal Spleen: Normal Adrenals/Urinary Tract: Adrenal glands are normal. Kidneys are normal. No cyst, mass, stone or hydronephrosis. Normal appearance of the bladder. Stomach/Bowel: Small bowel obstruction, fairly high-grade with March caliber change in the mid ileum. Normal appearing appendix. Free intraperitoneal fluid, small in volume. Some strandiness of the mesentery in the right mid abdomen probably related to some fluid distribution. The possibility of peritoneal nodules is not completely excluded. Vascular/Lymphatic: Aortic atherosclerosis. No aneurysm. IVC is normal. No retroperitoneal adenopathy. Reproductive: Previous hysterectomy.  No pelvic mass. Other: No free air. Musculoskeletal: Chronic lower lumbar degenerative changes. Previous decompressive surgery. IMPRESSION: Acute high-grade small bowel obstruction, probably in the mid ileum. Small a moderate amount of free intraperitoneal fluid. Some density in the mesenteric in the right abdomen the probably represents fluid distribution, but the possibility of peritoneal nodules is not  completely excluded. Electronically Signed   By: Nelson Chimes M.D.   On: 09/10/2016 08:00   Dg Abdomen Acute W/chest  Result Date: 09/10/2016 CLINICAL DATA:  66 y/o  F; abdominal pain with nausea and vomiting. EXAM: DG ABDOMEN ACUTE W/ 1V CHEST COMPARISON:  05/14/2016 CT abdomen and pelvis FINDINGS: Stable normal cardiac silhouette. Aortic atherosclerosis with calcification. A focal low lung volumes accentuate pulmonary markings. Minor bibasilar atelectasis. No pleural effusion or pneumothorax. Right upper quadrant surgical clips, presumably cholecystectomy. Mildly dilated loops of small bowel in the left hemiabdomen. IMPRESSION: 1. Low  lung volumes and minor bibasilar atelectasis. 2. Mildly dilated loops of small bowel in left hemiabdomen may represent obstruction or ileus. Electronically Signed   By: Kristine Garbe M.D.   On: 09/10/2016 06:04    Assessment/Plan Principal Problem:   SBO (small bowel obstruction) (HCC) Active Problems:   Hyperlipidemia   GERD   Obstructive sleep apnea   Bipolar disorder ([facility])   Hypothyroidism   COPD (chronic obstructive pulmonary disease) with chronic bronchitis ([place])   Poorly controlled type 2 diabetes mellitus with circulatory disorder ([place])   Labile hypertension     1. Small bowel obstruction. CT scan of the abdomen and pelvis indicate small bowel obstruction. She has prior history of cholecystectomy. General surgery is following. Will place NG tube for decompression and continue supportive measures. She'll be kept nothing by mouth for now. Her oral medications will need to be held/adjusted to IV. 2. COPD. No evidence of decompensation at this time. Continue on bronchodilators.  3. Hypertension. She is on multiple agents will need to be held. Will start on IV Lopressor for now with holding parameters and use hydralazine when necessary 4. Diabetes. Will give decreased dose of Lantus at 10 units daily at bedtime. Start on sliding scale insulin. 5. Hypothyroidism. Hold Synthroid for now. 6. Bipolar disorder. She normally takes Risperdal daily at bedtime. Since she is nothing by mouth, she'll receive Haldol daily at bedtime. 7. GERD. Continue on PPI 8. Obstructive sleep apnea. Patient uses nightly CPAP.  DVT prophylaxis: lovenox Code Status: full code Family Communication: no family present Disposition Plan: discharge home once improved Consults called: general surgery Admission status: inpatient, telemetry   Darl Kuss MD [facility] Pager [phone]  If 7PM-7AM, please contact night-coverage [email or web address] Password [email or web address]  09/10/2016, 10:09 AM

## 2016-09-10 NOTE — ED Provider Notes (Addendum)
[facility] DEPT Provider Note   CSN: [id number] Arrival date & time: 09/10/16  0422  Time seen 04:55 AM   History   Chief Complaint Chief Complaint  Patient presents with  . Abdominal Pain    HPI KYMIAH [name] is a 66 y.o. female.  HPI  patient presents via EMS stating she started having nausea and vomiting about 3:30 this morning. She states she's vomited about 4 times. She denies diarrhea but states she's had about 5 episodes of normal bowel movements. She complains of diffuse abdominal pain. She also notes she feels bloated especially on the left side. She also states she has chronic back pain. She has not been around anybody who is ill, she states she ate glazed chicken and ribs tonight. She was with her niece however her niece ate something else.  PCP [name], MD   Past Medical History:  Diagnosis Date  . Anemia   . Anxiety    takes Ativan daily  . Arthritis   . Bipolar disorder ([place])    takes Risperdal nightly  . Blood transfusion   . Cancer ([place])    In her gum  . Carpal tunnel syndrome of right wrist 05/23/2011  . Cervical disc disorder with radiculopathy of cervical region 10/31/2012  . Chronic back pain   . Chronic idiopathic constipation   . Colon polyps   . COPD (chronic obstructive pulmonary disease) with chronic bronchitis ([place]) 09/16/2013   Office Spirometry 10/30/2013-submaximal effort based on appearance of loop and curve. Numbers would fit with severe restriction but her physiologic capability may be better than this. FVC 0.91/44%, and 10.74/45%, FEV1/FVC 0.81, FEF 25-75% 1.43/69%    . Depression    takes Zoloft daily  . Diabetes mellitus    Type II  . Diverticulosis    TCS 9/08 by Dr. Delfin Edis for diarrhea . Bx for micro scopic colitis negative.   . Fibromyalgia   . Frequent falls   . GERD (gastroesophageal reflux disease)    takes Aciphex daily  . Glaucoma    eye drops daily  . Gum symptoms    infection on antibiotic  . Hemiplegia  affecting non-dominant side, post-stroke 08/02/2011  . Hyperlipidemia    takes Crestor daily  . Hypertension    takes Amlodipine,Metoprolol,and Clonidine daily  . Hypothyroidism    takes Synthroid daily  . IBS (irritable bowel syndrome)   . Insomnia    takes Trazodone nightly  . Metabolic encephalopathy 5/36/4680  . Migraines    chronic headaches  . Mononeuritis lower limb   . Osteoporosis   . Pancreatitis 2006   due to Depakote with normal EUS   . Schatzki's ring    non critical / EGD with ED 8/2011with RMR  . Seizures ([place])    takes Lamictal daily.Last seizure 3 yrs ago  . Sleep apnea    on CPAP  . Stroke [facility])    left sided weakness  . Tubular adenoma of colon     Patient Active Problem List   Diagnosis Date Noted  . Diabetic polyneuropathy associated with type 2 diabetes mellitus ([place]) 05/19/2016  . Smoker 04/24/2016  . Class 1 obesity with serious comorbidity and body mass index (BMI) of 33.0 to 33.9 in adult 04/24/2016  . Ulcer of toe, left, limited to breakdown of skin ([place]) 04/18/2016  . History of palpitations 08/09/2015  . Nausea without vomiting 08/09/2015  . Labile hypertension 08/03/2015  . Normal coronary arteries 08/03/2015  . Left-sided low back  pain with left-sided sciatica 06/27/2015  . Left flank pain 06/27/2015  . Acute cystitis without hematuria 05/10/2015  . Poorly controlled type 2 diabetes mellitus with circulatory disorder ([place]) 05/06/2015  . Multinodular goiter 05/06/2015  . Rectocele, female 04/27/2015  . Anal sphincter incontinence 04/27/2015  . Pelvic relaxation due to rectocele 03/30/2015  . Pulmonary hypertension ([place]) 02/22/2015  . Weakness of left upper extremity 02/21/2015  . Light cigarette smoker (1-9 cigarettes per day) 01/11/2015  . Migraine without aura and without status migrainosus, not intractable 07/02/2014  . Flatulence 02/18/2014  . Microcytic anemia 02/18/2014  . COPD (chronic obstructive pulmonary disease) with chronic  bronchitis ([place]) 09/16/2013  . Hypothyroidism 08/16/2013  . Gastroparesis 04/28/2013  . Seizure disorder ([place]) 01/19/2013  . Cervical disc disorder with radiculopathy of cervical region 10/31/2012  . Solitary pulmonary nodule 08/19/2012  . Anemia 07/05/2012  . 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 Allergic sinusitis 04/18/2012  . Meningioma ([place]) 11/19/2011  . 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 Hemiplegia affecting non-dominant side, post-stroke 08/02/2011  . Carpal tunnel syndrome of right wrist 05/23/2011  . Polypharmacy 04/28/2011  . Bipolar disorder ([place]) 04/28/2011  . Constipation 04/13/2011  . Falls frequently 12/12/2010  . Urinary incontinence 12/16/2009  . HEARING LOSS 10/26/2009  . Hyperlipidemia 12/11/2008  . IBS 12/11/2008  . GERD 07/29/2008  . MILK PRODUCTS ALLERGY 07/29/2008  . 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 Backache 06/19/2007  . Osteoporosis 06/19/2007  . Obstructive sleep apnea 06/19/2007  . TRIGGER FINGER 04/18/2007  . DIVERTICULOSIS, COLON 11/13/2006    Past Surgical History:  Procedure Laterality Date  . ABDOMINAL HYSTERECTOMY  1978  . BACK SURGERY  July 2012  . BACTERIAL OVERGROWTH TEST N/A 05/05/2013   Procedure: BACTERIAL OVERGROWTH TEST;  Surgeon: Daneil Dolin, MD;  [facility];  Service: Endoscopy;  Laterality: N/A;  7:30  . BIOPSY THYROID  2009  . BRAIN SURGERY  11/2011   resection of meningioma  . BREAST REDUCTION SURGERY  1994  . CARDIAC CATHETERIZATION  05/10/2005   normal coronaries, normal LV systolic function and EF (Dr. Jackie Plum)  . CARPAL TUNNEL RELEASE Left 07/22/04   Dr. Aline Brochure  . CATARACT EXTRACTION Bilateral   . CHOLECYSTECTOMY  1984  . COLONOSCOPY N/A 09/25/2012   DJS:HFWYOVZ diverticulosis.  colonic polyp-removed : tubular adenoma  . CRANIOTOMY  11/23/2011   Procedure: CRANIOTOMY TUMOR EXCISION;  Surgeon: Hosie Spangle, MD;  Location: [facility]  NEURO ORS;  Service: Neurosurgery;  Laterality: N/A;  Craniotomy for tumor resection  . ESOPHAGOGASTRODUODENOSCOPY  12/29/2010   Rourk-Retained food in the esophagus and stomach, small hiatal hernia, status post Maloney dilation of the esophagus  . ESOPHAGOGASTRODUODENOSCOPY N/A 09/25/2012   CHY:IFOYDXAJ atonic baggy esophagus status post Maloney dilation 48 F. Hiatal hernia  . GIVENS CAPSULE STUDY N/A 01/15/2013   NORMAL.   . IR GENERIC HISTORICAL  03/17/2016   IR RADIOLOGIST EVAL & MGMT 03/17/2016 [facility]-INTERV RAD  . LESION REMOVAL N/A 05/31/2015   Procedure: REMOVAL RIGHT AND LEFT LESIONS OF MANDIBLE;  Surgeon: Diona Browner, DDS;  Location: [facility];  Service: Oral Surgery;  Laterality: N/A;  . MALONEY DILATION  12/29/2010   RMR;  . NM MYOCAR PERF WALL MOTION  2006   "relavtiely normal" persantine, mild anterior thinning (breast attenuation artifact), no region of scar/ischemia  . OVARIAN CYST REMOVAL    . RECTOCELE REPAIR N/A 06/29/2015   Procedure: POSTERIOR REPAIR (RECTOCELE);  Surgeon: Jenny Reichmann  France Ravens, MD;  Location: [facility]S;  Service: Gynecology;  Laterality: N/A;  . REDUCTION MAMMAPLASTY Bilateral   . SPINE SURGERY  09/29/2010   Dr. Rolena Infante  . surgical excision of 3 tumors from right thigh and right buttock  and left upper thigh  2010  . TOOTH EXTRACTION Bilateral 12/14/2014   Procedure: REMOVAL OF BILATERAL MANDIBULAR EXOSTOSES;  Surgeon: Diona Browner, DDS;  Location: [facility];  Service: Oral Surgery;  Laterality: Bilateral;  . TRANSTHORACIC ECHOCARDIOGRAM  2010   EF 60-65%, mild conc LVH, grade 1 diastolic dysfunction; mildly calcified MV annulus with mildly thickened leaflets, mildly calcified MR annulus    OB History    Gravida Para Term Preterm AB Living   6 1 1   5      SAB TAB Ectopic Multiple Live Births   5               Home Medications    Prior to Admission medications   Medication Sig Start Date End Date Taking? Authorizing Provider  ACCU-CHEK FASTCLIX LANCETS MISC Use to  test blood sugar 4 times daily. Dx: E10.65 12/03/14  Yes Philemon Kingdom, MD  ACCU-CHEK SMARTVIEW test strip TEST BLOOD SUGAR FOUR TIMES DAILY AS DIRECTED 01/10/16  Yes Philemon Kingdom, MD  albuterol (PROVENTIL HFA;VENTOLIN HFA) 108 (90 Base) MCG/ACT inhaler Inhale 1-2 puffs every 6 hours as needed for wheezing, shortness of breath 07/27/16  Yes Young, Tarri Fuller D, MD  Alcohol Swabs (B-D SINGLE USE SWABS REGULAR) PADS Use for injections and glucose testing 8 times daily. Dx: E10.65 12/03/14  Yes Philemon Kingdom, MD  amLODipine (NORVASC) 5 MG tablet TAKE 2 TABLETS BY MOUTH ONCE DAILY. 08/30/16  Yes Philemon Kingdom, MD  aspirin EC 81 MG tablet Take 81 mg by mouth daily.   Yes [provider]  Azelastine-Fluticasone (DYMISTA) 137-50 MCG/ACT SUSP Place 1 puff into the nose at bedtime. 07/27/15  Yes Young, Tarri Fuller D, MD  BD PEN NEEDLE NANO U/F 32G X 4 MM MISC USE FOUR TIMES DAILY 04/20/15  Yes Philemon Kingdom, MD  Blood Glucose Calibration (ACCU-CHEK SMARTVIEW CONTROL) LIQD 1 each by Other route as needed. 12/03/14  Yes Philemon Kingdom, MD  Blood Glucose Monitoring Suppl (ACCU-CHEK NANO SMARTVIEW) W/DEVICE KIT 1 each by Does not apply route daily. Dx: E10.65 12/03/14  Yes Philemon Kingdom, MD  Cholecalciferol (VITAMIN D) 2000 units CAPS Take 1 capsule by mouth daily.   Yes [provider]  clobetasol cream (TEMOVATE) 4.31 % Apply 1 application topically daily.   Yes [provider]  cloNIDine (CATAPRES) 0.3 MG tablet TAKE 1 TABLET BY MOUTH EVERY EIGHT HOURS. ONCE AT 8AM, 4PM, AND 12 MIDNIGHT. 08/30/16  Yes [name], MD  diclofenac sodium (VOLTAREN) 1 % GEL Apply 2 g topically daily as needed (Pain). 07/21/16  Yes [name], MD  dicyclomine (BENTYL) 10 MG capsule TAKE 1 CAPSULE BY MOUTH THREE TIMES DAILY BEFORE MEALS. 05/23/16  Yes [name], MD  hydroxychloroquine (PLAQUENIL) 200 MG tablet Take 200 mg by mouth daily.   Yes [provider]    Insulin Glargine (TOUJEO SOLOSTAR) 300 UNIT/ML SOPN Inject 25 Units into the skin at bedtime. 06/27/16  Yes Philemon Kingdom, MD  lamoTRIgine (LAMICTAL) 100 MG tablet Take 1 tablet (100 mg total) by mouth 2 (two) times daily. 08/30/16  Yes Jaffe, Adam R, DO  levothyroxine (SYNTHROID, LEVOTHROID) 50 MCG tablet TAKE 1 TABLET BY MOUTH ONCE DAILY AND 1/2 TABLET ON SUNDAYS. 05/02/16  Yes Philemon Kingdom,  MD  LORazepam (ATIVAN) 0.5 MG tablet Take 1 tablet (0.5 mg total) by mouth 3 (three) times daily. 06/21/16  Yes Cloria Spring, MD  losartan (COZAAR) 50 MG tablet TAKE ONE TABLET BY MOUTH DAILY. 07/04/16  Yes [name], MD  metoprolol (LOPRESSOR) 50 MG tablet TAKE 1 TABLET BY MOUTH TWICE DAILY. 11/10/15  Yes Hilty, Nadean Corwin, MD  montelukast (SINGULAIR) 10 MG tablet TAKE ONE TABLET BY MOUTH ONCE DAILY. 08/30/16  Yes [name], MD  Multiple Vitamins-Minerals (ONE-A-DAY 50 PLUS PO) Take 1 tablet by mouth daily.   Yes [provider]  NOVOLOG FLEXPEN 100 UNIT/ML FlexPen INJECT 16 TO 30 UNITS INTO THE SKIN 3 (THREE) TIMES DAILY WITH MEALS. 01/06/16  Yes Philemon Kingdom, MD  nystatin (MYCOSTATIN/NYSTOP) powder APPLY TO AFFECTED AREA FOUR TIMES DAILY. 04/03/16  Yes [name], MD  pregabalin (LYRICA) 75 MG capsule Take 75 mg by mouth 2 (two) times daily.  10/26/11  Yes Carole Civil, MD  RABEprazole (ACIPHEX) 20 MG tablet Take 1 tablet (20 mg total) by mouth 2 (two) times daily. 06/12/16  Yes Pyrtle, Lajuan Lines, MD  RESTASIS 0.05 % ophthalmic emulsion Place 1 drop into both eyes 2 (two) times daily.  02/04/14  Yes [provider]  Riboflavin 400 MG TABS Take 400 mg by mouth daily. 08/09/16  Yes Jaffe, Adam R, DO  risperiDONE (RISPERDAL) 0.5 MG tablet Take 1 tablet (0.5 mg total) by mouth at bedtime. 06/21/16  Yes Cloria Spring, MD  rosuvastatin (CRESTOR) 5 MG tablet TAKE 1 TABLET BY MOUTH AT BEDTIME. 08/30/16  Yes [name], MD  sertraline (ZOLOFT) 100 MG  tablet Take 2 tablets (200 mg total) by mouth daily. 06/21/16  Yes Cloria Spring, MD  traZODone (DESYREL) 150 MG tablet Take 1 tablet (150 mg total) by mouth at bedtime. 06/21/16  Yes Cloria Spring, MD  vitamin B-12 (CYANOCOBALAMIN) 1000 MCG tablet Take 1,000 mcg by mouth daily.   Yes [provider]  vitamin E 400 UNIT capsule Take 400 Units by mouth daily.   Yes [provider]  Coenzyme Q-10 100 MG capsule Take 1 capsule (100 mg total) by mouth 3 (three) times daily. 08/09/16   Pieter Partridge, DO  HYDROcodone-acetaminophen (NORCO) 10-325 MG tablet Take 1 tablet by mouth 3 (three) times daily. 08/28/16   [provider]  Magnesium Citrate 200 MG TABS Take 400 mg by mouth daily. 08/09/16   Pieter Partridge, DO  methocarbamol (ROBAXIN) 500 MG tablet Take 500 mg by mouth daily. 12/21/14   [provider]  ondansetron (ZOFRAN) 4 MG tablet Take 1 tablet (4 mg total) by mouth every 8 (eight) hours as needed for nausea or vomiting. 12/29/15   [name], MD  Plecanatide (TRULANCE) 3 MG TABS Take 3 mg by mouth daily. 01/05/16   Pyrtle, Lajuan Lines, MD  predniSONE (DELTASONE) 10 MG tablet Take 6tabs x1day, then 5tabs x1day, then 4tabs x1day, then 3tabs x1day, then 2tabs x1day, then 1tab x1day, then STOP 08/09/16   Pieter Partridge, DO    Family History Family History  Problem Relation Age of Onset  . Heart attack Mother        HTN  . Pneumonia Father   . Kidney failure Father   . Diabetes Father   . Pancreatic cancer Sister   . Diabetes Brother   . Hypertension Brother   . Diabetes Brother   . Cancer Sister  breast   . Hypertension Son   . Sleep apnea Son   . Cancer Sister        pancreatic  . Stroke Maternal Grandmother   . Heart attack Maternal Grandfather   . Alcohol abuse Maternal Uncle   . Colon cancer Neg Hx   . Anesthesia problems Neg Hx   . Hypotension Neg Hx   . Malignant hyperthermia Neg Hx   . Pseudochol deficiency Neg Hx     Social  History Social History  Substance Use Topics  . Smoking status: Light Tobacco Smoker    Packs/day: 0.25    Years: 7.00    Types: Cigarettes  . Smokeless tobacco: Never Used     Comment: continues to smoke 1/4 pack a day   . Alcohol use No     Comment:    lives at home Lives alone   Allergies   Cephalexin; Iron; Milk-related compounds; Penicillins; and Phenazopyridine hcl   Review of Systems Review of Systems  All other systems reviewed and are negative.    Physical Exam Updated Vital Signs BP (!) 189/74   Pulse 84   Temp 98.7 F (37.1 C) (Oral)   Resp 18   Ht '4\' 11"'$  (1.499 m)   Wt 71.2 kg (157 lb)   SpO2 100%   BMI 31.71 kg/m   Vital signs normal except for hypertension   Physical Exam  Constitutional: She is oriented to person, place, and time. She appears well-developed and well-nourished.  Non-toxic appearance. She does not appear ill. She appears distressed.  HENT:  Head: Normocephalic and atraumatic.  Right Ear: External ear normal.  Left Ear: External ear normal.  Nose: Nose normal. No mucosal edema or rhinorrhea.  Mouth/Throat: Oropharynx is clear and moist. Mucous membranes are dry. No dental abscesses or uvula swelling.  Eyes: Conjunctivae and EOM are normal. Pupils are equal, round, and reactive to light.  Neck: Normal range of motion and full passive range of motion without pain. Neck supple.  Cardiovascular: Normal rate, regular rhythm and normal heart sounds.  Exam reveals no gallop and no friction rub.   No murmur heard. Pulmonary/Chest: Effort normal and breath sounds normal. No respiratory distress. She has no wheezes. She has no rhonchi. She has no rales. She exhibits no tenderness and no crepitus.  Abdominal: Soft. Normal appearance. She exhibits distension. Bowel sounds are decreased. There is generalized tenderness. There is no rebound and no guarding.  Musculoskeletal: Normal range of motion. She exhibits no edema or tenderness.  Moves all  extremities well.   Neurological: She is alert and oriented to person, place, and time. She has normal strength. No cranial nerve deficit.  Skin: Skin is warm, dry and intact. No rash noted. No erythema. No pallor.  Psychiatric: She has a normal mood and affect. Her speech is normal and behavior is normal. Her mood appears not anxious.  Nursing note and vitals reviewed.    ED Treatments / Results  Labs (all labs ordered are listed, but only abnormal results are displayed) Results for orders placed or performed during the hospital encounter of 09/10/16  Comprehensive metabolic panel  Result Value Ref Range   Sodium 142 135 - 145 mmol/L   Potassium 3.8 3.5 - 5.1 mmol/L   Chloride 105 101 - 111 mmol/L   CO2 27 22 - 32 mmol/L   Glucose, Bld 262 (H) 65 - 99 mg/dL   BUN 18 6 - 20 mg/dL   Creatinine, Ser 0.93 0.44 - 1.00  mg/dL   Calcium 9.8 8.9 - 10.3 mg/dL   Total Protein 8.1 6.5 - 8.1 g/dL   Albumin 4.2 3.5 - 5.0 g/dL   AST 29 15 - 41 U/L   ALT 28 14 - 54 U/L   Alkaline Phosphatase 127 (H) 38 - 126 U/L   Total Bilirubin 0.6 0.3 - 1.2 mg/dL   GFR calc non Af Amer >60 >60 mL/min   GFR calc Af Amer >60 >60 mL/min   Anion gap 10 5 - 15  Lipase, blood  Result Value Ref Range   Lipase 23 11 - 51 U/L  CBC with Differential  Result Value Ref Range   WBC 8.4 4.0 - 10.5 K/uL   RBC 5.66 (H) 3.87 - 5.11 MIL/uL   Hemoglobin 12.6 12.0 - 15.0 g/dL   HCT 41.5 36.0 - 46.0 %   MCV 73.3 (L) 78.0 - 100.0 fL   MCH 22.3 (L) 26.0 - 34.0 pg   MCHC 30.4 30.0 - 36.0 g/dL   RDW 14.9 11.5 - 15.5 %   Platelets 272 150 - 400 K/uL   Neutrophils Relative % 82 %   Lymphocytes Relative 12 %   Monocytes Relative 4 %   Eosinophils Relative 1 %   Basophils Relative 1 %   Neutro Abs 6.9 1.7 - 7.7 K/uL   Lymphs Abs 1.0 0.7 - 4.0 K/uL   Monocytes Absolute 0.3 0.1 - 1.0 K/uL   Eosinophils Absolute 0.1 0.0 - 0.7 K/uL   Basophils Absolute 0.1 0.0 - 0.1 K/uL   RBC Morphology TEARDROP CELLS   Urinalysis,  Routine w reflex microscopic  Result Value Ref Range   Color, Urine STRAW (A) YELLOW   APPearance CLEAR CLEAR   Specific Gravity, Urine 1.010 1.005 - 1.030   pH 7.0 5.0 - 8.0   Glucose, UA >=500 (A) NEGATIVE mg/dL   Hgb urine dipstick NEGATIVE NEGATIVE   Bilirubin Urine NEGATIVE NEGATIVE   Ketones, ur NEGATIVE NEGATIVE mg/dL   Protein, ur 30 (A) NEGATIVE mg/dL   Nitrite NEGATIVE NEGATIVE   Leukocytes, UA NEGATIVE NEGATIVE   RBC / HPF NONE SEEN 0 - 5 RBC/hpf   WBC, UA 0-5 0 - 5 WBC/hpf   Bacteria, UA RARE (A) NONE SEEN   Squamous Epithelial / LPF 0-5 (A) NONE SEEN   Mucous PRESENT    *Note: Due to a large number of results and/or encounters for the requested time period, some results have not been displayed. A complete set of results can be found in Results Review.   Laboratory interpretation all normal except hyperglycemia, glucosuria    EKG  EKG Interpretation None       Radiology  Ct Abdomen Pelvis W Contrast  Result Date: 09/10/2016 CLINICAL DATA:  History of oral carcinoma. Abdominal pain with nausea and vomiting beginning last night. EXAM: CT ABDOMEN AND PELVIS WITH CONTRAST TECHNIQUE: Multidetector CT imaging of the abdomen and pelvis was performed using the standard protocol following bolus administration of intravenous contrast. CONTRAST:  152m ISOVUE-300 IOPAMIDOL (ISOVUE-300) INJECTION 61% COMPARISON:  05/14/2016 FINDINGS: Lower chest: Mild basilar atelectasis. No pleural or pericardial fluid. Hepatobiliary: No liver parenchymal lesion. Previous cholecystectomy. Pancreas: Normal Spleen: Normal Adrenals/Urinary Tract: Adrenal glands are normal. Kidneys are normal. No cyst, mass, stone or hydronephrosis. Normal appearance of the bladder. Stomach/Bowel: Small bowel obstruction, fairly high-grade with March caliber change in the mid ileum. Normal appearing appendix. Free intraperitoneal fluid, small in volume. Some strandiness of the mesentery in the right mid abdomen  probably related to some fluid distribution. The possibility of peritoneal nodules is not completely excluded. Vascular/Lymphatic: Aortic atherosclerosis. No aneurysm. IVC is normal. No retroperitoneal adenopathy. Reproductive: Previous hysterectomy.  No pelvic mass. Other: No free air. Musculoskeletal: Chronic lower lumbar degenerative changes. Previous decompressive surgery. IMPRESSION: Acute high-grade small bowel obstruction, probably in the mid ileum. Small a moderate amount of free intraperitoneal fluid. Some density in the mesenteric in the right abdomen the probably represents fluid distribution, but the possibility of peritoneal nodules is not completely excluded. Electronically Signed   By: Nelson Chimes M.D.   On: 09/10/2016 08:00     Dg Abdomen Acute W/chest  Result Date: 09/10/2016 CLINICAL DATA:  66 y/o  F; abdominal pain with nausea and vomiting. EXAM: DG ABDOMEN ACUTE W/ 1V CHEST COMPARISON:  05/14/2016 CT abdomen and pelvis FINDINGS: Stable normal cardiac silhouette. Aortic atherosclerosis with calcification. A focal low lung volumes accentuate pulmonary markings. Minor bibasilar atelectasis. No pleural effusion or pneumothorax. Right upper quadrant surgical clips, presumably cholecystectomy. Mildly dilated loops of small bowel in the left hemiabdomen. IMPRESSION: 1. Low lung volumes and minor bibasilar atelectasis. 2. Mildly dilated loops of small bowel in left hemiabdomen may represent obstruction or ileus. Electronically Signed   By: Kristine Garbe M.D.   On: 09/10/2016 06:04    Procedures Procedures (including critical care time)  CRITICAL CARE Performed by: Orli Degrave L Janila Arrazola Total critical care time: 31 minutes Critical care time was exclusive of separately billable procedures and treating other patients. Critical care was necessary to treat or prevent imminent or life-threatening deterioration. Critical care was time spent personally by me on the following activities:  development of treatment plan with patient and/or surrogate as well as nursing, discussions with consultants, evaluation of patient's response to treatment, examination of patient, obtaining history from patient or surrogate, ordering and performing treatments and interventions, ordering and review of laboratory studies, ordering and review of radiographic studies, pulse oximetry and re-evaluation of patient's condition.   Medications Ordered in ED Medications  ciprofloxacin (CIPRO) IVPB 400 mg (not administered)    And  metroNIDAZOLE (FLAGYL) IVPB 500 mg (not administered)  ondansetron (ZOFRAN-ODT) disintegrating tablet 4 mg (4 mg Oral Given 09/10/16 0503)  sodium chloride 0.9 % bolus 1,000 mL (0 mLs Intravenous Stopped 09/10/16 0807)  sodium chloride 0.9 % bolus 500 mL (0 mLs Intravenous Stopped 09/10/16 0807)  fentaNYL (SUBLIMAZE) injection 50 mcg (50 mcg Intravenous Given 09/10/16 0537)  ondansetron (ZOFRAN) injection 4 mg (4 mg Intravenous Given 09/10/16 0537)  fentaNYL (SUBLIMAZE) injection 50 mcg (50 mcg Intravenous Given 09/10/16 0807)  iopamidol (ISOVUE-300) 61 % injection 100 mL (100 mLs Intravenous Contrast Given 09/10/16 0706)     Initial Impression / Assessment and Plan / ED Course  I have reviewed the triage vital signs and the nursing notes.  Pertinent labs & imaging results that were available during my care of the patient were reviewed by me and considered in my medical decision making (see chart for details).  Patient was given IV fluids, IV nausea and pain medication. I started with an acute abdominal series to make sure she doesn't have a volvulus or other bowel blockage. She c/o more abdominal distention on the left and is more tender on the left although she is tender diffusely.   6:35 AM after reviewing her test results CT scan of the abdomen was ordered. I went back to see the patient and give her her test results. The dilatation of the bowel corresponds to where her  pain is worse.  Her pain medicine was repeated.  8:10 AM patient given her test results.  8:13 AM Dr. Arnoldo Morale, general surgeon states he will see patient this morning.  08:28 AM Dr Roderic Palau, hospitalist, will admit  Final Clinical Impressions(s) / ED Diagnoses   Final diagnoses:  Abdominal pain, unspecified abdominal location  Non-intractable vomiting with nausea, unspecified vomiting type  SBO (small bowel obstruction) ([place])    Plan admission  Rolland Porter, MD, Barbette Or, MD, Barbette Or, MD 09/10/16 0830    Rolland Porter, MD 09/10/16 [phone]

## 2016-09-10 NOTE — Consult Note (Signed)
Reason for Consult: Abdominal distention Referring Physician: Dr. Amelia Jo Moger is an 66 y.o. female.  HPI: Patient is a 66 year old black female who presented to the emergency room with worsening abdominal pain. She states she has had episodes of emesis. She did have a well formed bowel movement last night. She has multiple medical problems including hypertension, diabetes mellitus, bipolar disorder, and COPD. CT scan the abdomen revealed a small bowel obstruction with gradual decrease in caliber of the bowel to the colon. No specific transition zone is noted. Mild peritoneal fluid is present. I was consulted by the emergency room for further evaluation and treatment. She states her pain is around 4 out of 10.  Past Medical History:  Diagnosis Date  . Anemia   . Anxiety    takes Ativan daily  . Arthritis   . Bipolar disorder ([place])    takes Risperdal nightly  . Blood transfusion   . Cancer ([place])    In her gum  . Carpal tunnel syndrome of right wrist 05/23/2011  . Cervical disc disorder with radiculopathy of cervical region 10/31/2012  . Chronic back pain   . Chronic idiopathic constipation   . Colon polyps   . COPD (chronic obstructive pulmonary disease) with chronic bronchitis ([place]) 09/16/2013   Office Spirometry 10/30/2013-submaximal effort based on appearance of loop and curve. Numbers would fit with severe restriction but her physiologic capability may be better than this. FVC 0.91/44%, and 10.74/45%, FEV1/FVC 0.81, FEF 25-75% 1.43/69%    . Depression    takes Zoloft daily  . Diabetes mellitus    Type II  . Diverticulosis    TCS 9/08 by Dr. Delfin Edis for diarrhea . Bx for micro scopic colitis negative.   . Fibromyalgia   . Frequent falls   . GERD (gastroesophageal reflux disease)    takes Aciphex daily  . Glaucoma    eye drops daily  . Gum symptoms    infection on antibiotic  . Hemiplegia affecting non-dominant side, post-stroke 08/02/2011  . Hyperlipidemia    takes  Crestor daily  . Hypertension    takes Amlodipine,Metoprolol,and Clonidine daily  . Hypothyroidism    takes Synthroid daily  . IBS (irritable bowel syndrome)   . Insomnia    takes Trazodone nightly  . Metabolic encephalopathy 0/93/2355  . Migraines    chronic headaches  . Mononeuritis lower limb   . Osteoporosis   . Pancreatitis 2006   due to Depakote with normal EUS   . Schatzki's ring    non critical / EGD with ED 8/2011with RMR  . Seizures ([place])    takes Lamictal daily.Last seizure 3 yrs ago  . Sleep apnea    on CPAP  . Stroke [facility])    left sided weakness  . Tubular adenoma of colon     Past Surgical History:  Procedure Laterality Date  . ABDOMINAL HYSTERECTOMY  1978  . BACK SURGERY  July 2012  . BACTERIAL OVERGROWTH TEST N/A 05/05/2013   Procedure: BACTERIAL OVERGROWTH TEST;  Surgeon: Daneil Dolin, MD;  [facility];  Service: Endoscopy;  Laterality: N/A;  7:30  . BIOPSY THYROID  2009  . BRAIN SURGERY  11/2011   resection of meningioma  . BREAST REDUCTION SURGERY  1994  . CARDIAC CATHETERIZATION  05/10/2005   normal coronaries, normal LV systolic function and EF (Dr. Jackie Plum)  . CARPAL TUNNEL RELEASE Left 07/22/04   Dr. Aline Brochure  . CATARACT EXTRACTION Bilateral   . CHOLECYSTECTOMY  1984  . COLONOSCOPY N/A 09/25/2012   QPR:[id number] diverticulosis.  colonic polyp-removed : tubular adenoma  . CRANIOTOMY  11/23/2011   Procedure: CRANIOTOMY TUMOR EXCISION;  Surgeon: Hosie Spangle, MD;  Location: [place] NEURO ORS;  Service: Neurosurgery;  Laterality: N/A;  Craniotomy for tumor resection  . ESOPHAGOGASTRODUODENOSCOPY  12/29/2010   Rourk-Retained food in the esophagus and stomach, small hiatal hernia, status post Maloney dilation of the esophagus  . ESOPHAGOGASTRODUODENOSCOPY N/A 09/25/2012   [id number] atonic baggy esophagus status post Maloney dilation 59 F. Hiatal hernia  . GIVENS CAPSULE STUDY N/A 01/15/2013   NORMAL.   . IR GENERIC HISTORICAL  03/17/2016    IR RADIOLOGIST EVAL & MGMT 03/17/2016 [facility]INTER[facility] RAD  . LESION REMOVAL N/A 05/31/2015   Procedure: REMOVAL RIGHT AND LEFT LESIONS OF MANDIBLE;  Surgeon: Diona Browner, DDS;  Location: [place];  Service: Oral Surgery;  Laterality: N/A;  . MALONEY DILATION  12/29/2010   RMR;  . NM MYOCAR PERF WALL MOTION  2006   "relavtiely normal" persantine, mild anterior thinning (breast attenuation artifact), no region of scar/ischemia  . OVARIAN CYST REMOVAL    . RECTOCELE REPAIR N/A 06/29/2015   Procedure: POSTERIOR REPAIR (RECTOCELE);  Surgeon: Jonnie Kind, MD;  Location: [facility];  Service: Gynecology;  Laterality: N/A;  . REDUCTION MAMMAPLASTY Bilateral   . SPINE SURGERY  09/29/2010   Dr. Rolena Infante  . surgical excision of 3 tumors from right thigh and right buttock  and left upper thigh  2010  . TOOTH EXTRACTION Bilateral 12/14/2014   Procedure: REMOVAL OF BILATERAL MANDIBULAR EXOSTOSES;  Surgeon: Diona Browner, DDS;  Location: [place];  Service: Oral Surgery;  Laterality: Bilateral;  . TRANSTHORACIC ECHOCARDIOGRAM  2010   EF 60-65%, mild conc LVH, grade 1 diastolic dysfunction; mildly calcified MV annulus with mildly thickened leaflets, mildly calcified MR annulus    Family History  Problem Relation Age of Onset  . Heart attack Mother        HTN  . Pneumonia Father   . Kidney failure Father   . Diabetes Father   . Pancreatic cancer Sister   . Diabetes Brother   . Hypertension Brother   . Diabetes Brother   . Cancer Sister        breast   . Hypertension Son   . Sleep apnea Son   . Cancer Sister        pancreatic  . Stroke Maternal Grandmother   . Heart attack Maternal Grandfather   . Alcohol abuse Maternal Uncle   . Colon cancer Neg Hx   . Anesthesia problems Neg Hx   . Hypotension Neg Hx   . Malignant hyperthermia Neg Hx   . Pseudochol deficiency Neg Hx     Social History:  reports that she has been smoking Cigarettes.  She has a 1.75 pack-year smoking history. She has never used smokeless  tobacco. She reports that she does not drink alcohol or use drugs.  Allergies:  Allergies  Allergen Reactions  . Cephalexin Hives  . Iron Nausea And Vomiting  . Milk-Related Compounds Other (See Comments)    Doesn't agree with stomach.   . Penicillins Hives    Has patient had a PCN reaction causing immediate rash, facial/tongue/throat swelling, SOB or lightheadedness with hypotension: Yes Has patient had a PCN reaction causing severe rash involving mucus membranes or skin necrosis: No Has patient had a PCN reaction that required hospitalization No Has patient had a PCN reaction occurring within the last 10 years:  No If all of the above answers are "NO", then may proceed with Cephalosporin use.   [name] Phenazopyridine Hcl Hives          Medications:  Prior to Admission:  (Not in a hospital admission) Scheduled: . enoxaparin (LOVENOX) injection  40 mg Subcutaneous Q24H    Results for orders placed or performed during the hospital encounter of 09/10/16 (from the past 48 hour(s))  Comprehensive metabolic panel     Status: Abnormal   Collection Time: 09/10/16  5:03 AM  Result Value Ref Range   Sodium 142 135 - 145 mmol/L   Potassium 3.8 3.5 - 5.1 mmol/L   Chloride 105 101 - 111 mmol/L   CO2 27 22 - 32 mmol/L   Glucose, Bld 262 (H) 65 - 99 mg/dL   BUN 18 6 - 20 mg/dL   Creatinine, Ser 0.93 0.44 - 1.00 mg/dL   Calcium 9.8 8.9 - 10.3 mg/dL   Total Protein 8.1 6.5 - 8.1 g/dL   Albumin 4.2 3.5 - 5.0 g/dL   AST 29 15 - 41 U/L   ALT 28 14 - 54 U/L   Alkaline Phosphatase 127 (H) 38 - 126 U/L   Total Bilirubin 0.6 0.3 - 1.2 mg/dL   GFR calc non Af Amer >60 >60 mL/min   GFR calc Af Amer >60 >60 mL/min    Comment: (NOTE) The eGFR has been calculated using the CKD EPI equation. This calculation has not been validated in all clinical situations. eGFR's persistently <60 mL/min signify possible Chronic Kidney Disease.    Anion gap 10 5 - 15  Lipase, blood     Status: None   Collection  Time: 09/10/16  5:03 AM  Result Value Ref Range   Lipase 23 11 - 51 U/L  CBC with Differential     Status: Abnormal   Collection Time: 09/10/16  5:03 AM  Result Value Ref Range   WBC 8.4 4.0 - 10.5 K/uL   RBC 5.66 (H) 3.87 - 5.11 MIL/uL   Hemoglobin 12.6 12.0 - 15.0 g/dL   HCT 41.5 36.0 - 46.0 %   MCV 73.3 (L) 78.0 - 100.0 fL   MCH 22.3 (L) 26.0 - 34.0 pg   MCHC 30.4 30.0 - 36.0 g/dL   RDW 14.9 11.5 - 15.5 %   Platelets 272 150 - 400 K/uL   Neutrophils Relative % 82 %   Lymphocytes Relative 12 %   Monocytes Relative 4 %   Eosinophils Relative 1 %   Basophils Relative 1 %   Neutro Abs 6.9 1.7 - 7.7 K/uL   Lymphs Abs 1.0 0.7 - 4.0 K/uL   Monocytes Absolute 0.3 0.1 - 1.0 K/uL   Eosinophils Absolute 0.1 0.0 - 0.7 K/uL   Basophils Absolute 0.1 0.0 - 0.1 K/uL   RBC Morphology TEARDROP CELLS     Comment: MICROCYTES  Urinalysis, Routine w reflex microscopic     Status: Abnormal   Collection Time: 09/10/16  5:35 AM  Result Value Ref Range   Color, Urine STRAW (A) YELLOW   APPearance CLEAR CLEAR   Specific Gravity, Urine 1.010 1.005 - 1.030   pH 7.0 5.0 - 8.0   Glucose, UA >=500 (A) NEGATIVE mg/dL   Hgb urine dipstick NEGATIVE NEGATIVE   Bilirubin Urine NEGATIVE NEGATIVE   Ketones, ur NEGATIVE NEGATIVE mg/dL   Protein, ur 30 (A) NEGATIVE mg/dL   Nitrite NEGATIVE NEGATIVE   Leukocytes, UA NEGATIVE NEGATIVE   RBC / HPF NONE SEEN 0 -  5 RBC/hpf   WBC, UA 0-5 0 - 5 WBC/hpf   Bacteria, UA RARE (A) NONE SEEN   Squamous Epithelial / LPF 0-5 (A) NONE SEEN   Mucous PRESENT    *Note: Due to a large number of results and/or encounters for the requested time period, some results have not been displayed. A complete set of results can be found in Results Review.    Ct Abdomen Pelvis W Contrast  Result Date: 09/10/2016 CLINICAL DATA:  History of oral carcinoma. Abdominal pain with nausea and vomiting beginning last night. EXAM: CT ABDOMEN AND PELVIS WITH CONTRAST TECHNIQUE: Multidetector CT  imaging of the abdomen and pelvis was performed using the standard protocol following bolus administration of intravenous contrast. CONTRAST:  129m ISOVUE-300 IOPAMIDOL (ISOVUE-300) INJECTION 61% COMPARISON:  05/14/2016 FINDINGS: Lower chest: Mild basilar atelectasis. No pleural or pericardial fluid. Hepatobiliary: No liver parenchymal lesion. Previous cholecystectomy. Pancreas: Normal Spleen: Normal Adrenals/Urinary Tract: Adrenal glands are normal. Kidneys are normal. No cyst, mass, stone or hydronephrosis. Normal appearance of the bladder. Stomach/Bowel: Small bowel obstruction, fairly high-grade with March caliber change in the mid ileum. Normal appearing appendix. Free intraperitoneal fluid, small in volume. Some strandiness of the mesentery in the right mid abdomen probably related to some fluid distribution. The possibility of peritoneal nodules is not completely excluded. Vascular/Lymphatic: Aortic atherosclerosis. No aneurysm. IVC is normal. No retroperitoneal adenopathy. Reproductive: Previous hysterectomy.  No pelvic mass. Other: No free air. Musculoskeletal: Chronic lower lumbar degenerative changes. Previous decompressive surgery. IMPRESSION: Acute high-grade small bowel obstruction, probably in the mid ileum. Small a moderate amount of free intraperitoneal fluid. Some density in the mesenteric in the right abdomen the probably represents fluid distribution, but the possibility of peritoneal nodules is not completely excluded. Electronically Signed   By: MNelson ChimesM.D.   On: 09/10/2016 08:00   Dg Abdomen Acute W/chest  Result Date: 09/10/2016 CLINICAL DATA:  66y/o  F; abdominal pain with nausea and vomiting. EXAM: DG ABDOMEN ACUTE W/ 1V CHEST COMPARISON:  05/14/2016 CT abdomen and pelvis FINDINGS: Stable normal cardiac silhouette. Aortic atherosclerosis with calcification. A focal low lung volumes accentuate pulmonary markings. Minor bibasilar atelectasis. No pleural effusion or pneumothorax.  Right upper quadrant surgical clips, presumably cholecystectomy. Mildly dilated loops of small bowel in the left hemiabdomen. IMPRESSION: 1. Low lung volumes and minor bibasilar atelectasis. 2. Mildly dilated loops of small bowel in left hemiabdomen may represent obstruction or ileus. Electronically Signed   By: LKristine GarbeM.D.   On: 09/10/2016 06:04    ROS:  Pertinent items are noted in HPI.  Blood pressure (!) 189/74, pulse 84, temperature 98.7 F (37.1 C), temperature source Oral, resp. rate 18, height _0  (1.499 m), weight 157 lb (71.2 kg), SpO2 100 %. Physical Exam: Black female in no acute distress. Head is normocephalic, atraumatic Neck is supple Lungs clear auscultation with breath sounds bilaterally Heart examination reveals a regular rate and rhythm without S3, S4, murmurs Abdomen is soft but distended. I do hear occasional bowel sounds. No rigidity is noted. No hernias are noted.  CT scan images personally reviewed. Dr. MBlythe Stanfordnote personally reviewed  Assessment/Plan: Impression: Small bowel obstruction, etiology unknown. No need for acute surgical intervention at this time. Plan: Due to significant gastric dilatation, NG tube is warranted. Discussed with Dr. MRoderic Palau  MAviva Signs7/03/2016, 10:50 AM

## 2016-09-11 LAB — CBC
HCT: 35.6 % — ABNORMAL LOW (ref 36.0–46.0)
Hemoglobin: 10.7 g/dL — ABNORMAL LOW (ref 12.0–15.0)
MCH: 22.3 pg — AB (ref 26.0–34.0)
MCHC: 30.1 g/dL (ref 30.0–36.0)
MCV: 74.3 fL — AB (ref 78.0–100.0)
Platelets: 249 10*3/uL (ref 150–400)
RBC: 4.79 MIL/uL (ref 3.87–5.11)
RDW: 14.5 % (ref 11.5–15.5)
WBC: 10.2 10*3/uL (ref 4.0–10.5)

## 2016-09-11 LAB — BASIC METABOLIC PANEL
Anion gap: 9 (ref 5–15)
BUN: 24 mg/dL — AB (ref 6–20)
CALCIUM: 8.5 mg/dL — AB (ref 8.9–10.3)
CHLORIDE: 107 mmol/L (ref 101–111)
CO2: 24 mmol/L (ref 22–32)
CREATININE: 0.85 mg/dL (ref 0.44–1.00)
GFR calc Af Amer: >60 mL/min (ref >60)
GFR calc non Af Amer: >60 mL/min (ref >60)
Glucose, Bld: 137 mg/dL — ABNORMAL HIGH (ref 65–99)
Potassium: 3.2 mmol/L — ABNORMAL LOW (ref 3.5–5.1)
Sodium: 140 mmol/L (ref 135–145)

## 2016-09-11 LAB — GLUCOSE, CAPILLARY
GLUCOSE-CAPILLARY: 115 mg/dL — AB (ref 65–99)
GLUCOSE-CAPILLARY: 120 mg/dL — AB (ref 65–99)
GLUCOSE-CAPILLARY: 150 mg/dL — AB (ref 65–99)
GLUCOSE-CAPILLARY: 173 mg/dL — AB (ref 65–99)
Glucose-Capillary: 145 mg/dL — ABNORMAL HIGH (ref 65–99)
Glucose-Capillary: 159 mg/dL — ABNORMAL HIGH (ref 65–99)
Glucose-Capillary: 166 mg/dL — ABNORMAL HIGH (ref 65–99)

## 2016-09-11 MED ORDER — HALOPERIDOL LACTATE 5 MG/ML IJ SOLN
2.0000 mg | Freq: Every day | INTRAMUSCULAR | Status: DC
  Administered 2016-09-12 – 2016-09-17 (×6): 2 mg via INTRAVENOUS
  Filled 2016-09-11 (×6): qty 1

## 2016-09-11 MED ORDER — BISACODYL 10 MG RE SUPP
10.0000 mg | Freq: Two times a day (BID) | RECTAL | Status: DC
  Administered 2016-09-11 – 2016-09-17 (×13): 10 mg via RECTAL
  Filled 2016-09-11 (×15): qty 1

## 2016-09-11 MED ORDER — PHENOL 1.4 % MT LIQD: 1.0000 | OROMUCOSAL | Status: DC | PRN

## 2016-09-11 MED ORDER — POTASSIUM CHLORIDE 10 MEQ/100ML IV SOLN
10.0000 meq | INTRAVENOUS | Status: AC
  Administered 2016-09-11 (×3): 10 meq via INTRAVENOUS
  Filled 2016-09-11 (×4): qty 100

## 2016-09-11 MED ORDER — LORAZEPAM 2 MG/ML IJ SOLN
0.5000 mg | Freq: Four times a day (QID) | INTRAMUSCULAR | Status: DC | PRN
  Administered 2016-09-11 – 2016-09-13 (×3): 0.5 mg via INTRAVENOUS
  Filled 2016-09-11 (×3): qty 1

## 2016-09-11 MED ORDER — DIPHENHYDRAMINE HCL 50 MG/ML IJ SOLN
25.0000 mg | Freq: Every evening | INTRAMUSCULAR | Status: DC | PRN
  Administered 2016-09-12: 25 mg via INTRAVENOUS
  Filled 2016-09-11: qty 1

## 2016-09-11 NOTE — Progress Notes (Signed)
PROGRESS NOTE    Sylvania Moss Girgenti  EXH:[id number] DOB: March 07, 1951 DOA: 09/10/2016 PCP: [name], MD    Brief Narrative:  66 year old female admitted to the hospital with abdominal pain and vomiting. Found to have small bowel obstruction on CT scan. Admitted for supportive management . general surgery following.   Assessment & Plan:   Principal Problem:   SBO (small bowel obstruction) ([facility]) Active Problems:   Hyperlipidemia   GERD   Obstructive sleep apnea   Bipolar disorder ([facility])   Hypothyroidism   COPD (chronic obstructive pulmonary disease) with chronic bronchitis ([place])   Poorly controlled type 2 diabetes mellitus with circulatory disorder ([place])   Labile hypertension   1. Small bowel obstruction. CT scan of the abdomen and pelvis indicate small bowel obstruction. She has prior history of cholecystectomy. General surgery is following. NG tube has been placed. She's not had any recovery of bowel function yet. Continue supportive management. Encourage ambulation 2. COPD. No evidence of decompensation at this time. Continue on bronchodilators.  3. Hypertension. She is on multiple agents which are on hold due to nothing by mouth status. Continue IV Lopressor and hydralazine when necessary 4. Diabetes. Continue Lantus and sliding scale. Blood sugars have been stable. 5. Hypothyroidism. Hold Synthroid for now. 6. Bipolar disorder. She normally takes Risperdal daily at bedtime. Since she is nothing by mouth, she'll receive Haldol daily at bedtime. 7. GERD. Continue on PPI 8. Obstructive sleep apnea. Patient uses nightly CPAP.   DVT prophylaxis: Lovenox Code Status: Full code Family Communication: Discussed with family at the bedside Disposition Plan: Discharge home once improved   Consultants:   Gen. surgery  Procedures:     Antimicrobials:       Subjective: Still has abdominal discomfort. No bowel movements or flatus.  Objective: Vitals:   09/10/16 2008  09/10/16 2128 09/11/16 0609 09/11/16 0755  BP:  (!) 180/64 (!) 153/59   Pulse:  92 75   Resp:  18 18   Temp:  100 F (37.8 C) 99.6 F (37.6 C)   TempSrc:  Oral Oral   SpO2: 91% 94% 94% 92%  Weight:      Height:        Intake/Output Summary (Last 24 hours) at 09/11/16 1208 Last data filed at 09/11/16 0538  Gross per 24 hour  Intake          1548.33 ml  Output              150 ml  Net          1398.33 ml   Filed Weights   09/10/16 0423 09/10/16 1144  Weight: 71.2 kg (157 lb) 71.7 kg (158 lb 1.6 oz)    Examination:  General exam: Appears calm and comfortable  Respiratory system: Clear to auscultation. Respiratory effort normal. Cardiovascular system: S1 & S2 heard, RRR. No JVD, murmurs, rubs, gallops or clicks. No pedal edema. Gastrointestinal system: Abdomen is distended, soft and diffusely tender. No organomegaly or masses felt. Normal bowel sounds heard. Central nervous system: Alert and oriented. No focal neurological deficits. Extremities: Symmetric 5 x 5 power. Skin: No rashes, lesions or ulcers Psychiatry: Judgement and insight appear normal. Mood & affect appropriate.     Data Reviewed: I have personally reviewed following labs and imaging studies  CBC:  Recent Labs Lab 09/10/16 0503 09/11/16 0551  WBC 8.4 10.2  NEUTROABS 6.9  --   HGB 12.6 10.7*  HCT 41.5 35.6*  MCV 73.3* 74.3*  PLT 272  086   Basic Metabolic Panel:  Recent Labs Lab 09/10/16 0503 09/11/16 0551  NA 142 140  K 3.8 3.2*  CL 105 107  CO2 27 24  GLUCOSE 262* 137*  BUN 18 24*  CREATININE 0.93 0.85  CALCIUM 9.8 8.5*   GFR: Estimated Creatinine Clearance: 56.1 mL/min (by C-G formula based on SCr of 0.85 mg/dL). Liver Function Tests:  Recent Labs Lab 09/10/16 0503  AST 29  ALT 28  ALKPHOS 127*  BILITOT 0.6  PROT 8.1  ALBUMIN 4.2    Recent Labs Lab 09/10/16 0503  LIPASE 23   No results for input(s): AMMONIA in the last 168 hours. Coagulation Profile: No results for  input(s): INR, PROTIME in the last 168 hours. Cardiac Enzymes: No results for input(s): CKTOTAL, CKMB, CKMBINDEX, TROPONINI in the last 168 hours. BNP (last 3 results) No results for input(s): PROBNP in the last 8760 hours. HbA1C: No results for input(s): HGBA1C in the last 72 hours. CBG:  Recent Labs Lab 09/10/16 2026 09/11/16 0019 09/11/16 0404 09/11/16 0744 09/11/16 1106  GLUCAP 142* 145* 159* 166* 115*   Lipid Profile: No results for input(s): CHOL, HDL, LDLCALC, TRIG, CHOLHDL, LDLDIRECT in the last 72 hours. Thyroid Function Tests: No results for input(s): TSH, T4TOTAL, FREET4, T3FREE, THYROIDAB in the last 72 hours. Anemia Panel: No results for input(s): VITAMINB12, FOLATE, FERRITIN, TIBC, IRON, RETICCTPCT in the last 72 hours. Sepsis Labs: No results for input(s): PROCALCITON, LATICACIDVEN in the last 168 hours.  No results found for this or any previous visit (from the past 240 hour(s)).       Radiology Studies: Ct Abdomen Pelvis W Contrast  Result Date: 09/10/2016 CLINICAL DATA:  History of oral carcinoma. Abdominal pain with nausea and vomiting beginning last night. EXAM: CT ABDOMEN AND PELVIS WITH CONTRAST TECHNIQUE: Multidetector CT imaging of the abdomen and pelvis was performed using the standard protocol following bolus administration of intravenous contrast. CONTRAST:  17mL ISOVUE-300 IOPAMIDOL (ISOVUE-300) INJECTION 61% COMPARISON:  05/14/2016 FINDINGS: Lower chest: Mild basilar atelectasis. No pleural or pericardial fluid. Hepatobiliary: No liver parenchymal lesion. Previous cholecystectomy. Pancreas: Normal Spleen: Normal Adrenals/Urinary Tract: Adrenal glands are normal. Kidneys are normal. No cyst, mass, stone or hydronephrosis. Normal appearance of the bladder. Stomach/Bowel: Small bowel obstruction, fairly high-grade with March caliber change in the mid ileum. Normal appearing appendix. Free intraperitoneal fluid, small in volume. Some strandiness of the  mesentery in the right mid abdomen probably related to some fluid distribution. The possibility of peritoneal nodules is not completely excluded. Vascular/Lymphatic: Aortic atherosclerosis. No aneurysm. IVC is normal. No retroperitoneal adenopathy. Reproductive: Previous hysterectomy.  No pelvic mass. Other: No free air. Musculoskeletal: Chronic lower lumbar degenerative changes. Previous decompressive surgery. IMPRESSION: Acute high-grade small bowel obstruction, probably in the mid ileum. Small a moderate amount of free intraperitoneal fluid. Some density in the mesenteric in the right abdomen the probably represents fluid distribution, but the possibility of peritoneal nodules is not completely excluded. Electronically Signed   By: Nelson Chimes M.D.   On: 09/10/2016 08:00   Dg Chest Port 1 View  Result Date: 09/10/2016 CLINICAL DATA:  Nasogastric placement. EXAM: PORTABLE CHEST 1 VIEW COMPARISON:  Earlier same day FINDINGS: Nasogastric tube enters the stomach. Tip is not visible but this extends at least to the mid body. Slightly more prominent atelectasis at the right lung base. IMPRESSION: Nasogastric tube enters the stomach with the tip at least at the mid body of the stomach. Slight worsening of right lower lobe atelectasis.  Electronically Signed   By: Nelson Chimes M.D.   On: 09/10/2016 14:09   Dg Abdomen Acute W/chest  Result Date: 09/10/2016 CLINICAL DATA:  66 y/o  F; abdominal pain with nausea and vomiting. EXAM: DG ABDOMEN ACUTE W/ 1V CHEST COMPARISON:  05/14/2016 CT abdomen and pelvis FINDINGS: Stable normal cardiac silhouette. Aortic atherosclerosis with calcification. A focal low lung volumes accentuate pulmonary markings. Minor bibasilar atelectasis. No pleural effusion or pneumothorax. Right upper quadrant surgical clips, presumably cholecystectomy. Mildly dilated loops of small bowel in the left hemiabdomen. IMPRESSION: 1. Low lung volumes and minor bibasilar atelectasis. 2. Mildly dilated  loops of small bowel in left hemiabdomen may represent obstruction or ileus. Electronically Signed   By: Kristine Garbe M.D.   On: 09/10/2016 06:04        Scheduled Meds: . Azelastine-Fluticasone  1 puff Nasal QHS  . bisacodyl  10 mg Rectal BID  . clobetasol ointment  1 application Topical Daily  . cycloSPORINE  1 drop Both Eyes BID  . enoxaparin (LOVENOX) injection  40 mg Subcutaneous Q24H  . insulin aspart  0-15 Units Subcutaneous Q4H  . insulin glargine  10 Units Subcutaneous QHS  . ipratropium-albuterol  3 mL Nebulization TID  . metoprolol tartrate  5 mg Intravenous Q6H  . pantoprazole (PROTONIX) IV  40 mg Intravenous Q24H   Continuous Infusions: . 0.9 % NaCl with KCl 20 mEq / L 50 mL/hr at 09/10/16 1340  . ciprofloxacin Stopped (09/11/16 8546)  . metronidazole Stopped (09/11/16 2703)  . potassium chloride       LOS: 1 day    Time spent: 50mins    Rosalio Catterton, MD [facility] Pager [phone]  If 7PM-7AM, please contact night-coverage [email or web address] Password [email or web address] 09/11/2016, 12:08 PM

## 2016-09-11 NOTE — Progress Notes (Signed)
Subjective: No bowel movement or flatus yet. No nausea or vomiting since she has been admitted.  Objective: Vital [name] in last 24 hours: Temp:  [98.6 F (37 C)-100 F (37.8 C)] 99.6 F (37.6 C) (07/02 0609) Pulse Rate:  [75-92] 75 (07/02 0609) Resp:  [18] 18 (07/02 0609) BP: (148-191)/(59-85) 153/59 (07/02 0609) SpO2:  [91 %-99 %] 92 % (07/02 0755) Weight:  [158 lb 1.6 oz (71.7 kg)] 158 lb 1.6 oz (71.7 kg) (07/01 1144)    Intake/Output from previous day: 07/01 0701 - 07/02 0700 In: 3248.3 [P.O.:350; I.V.:798.3; IV Piggyback:2100] Out: 150 [Urine:150] Intake/Output this shift: No intake/output data recorded.  General appearance: alert, cooperative and no distress GI: soft, non-tender; bowel sounds normal; no masses,  no organomegaly and Minimal distention  Lab Results:   Recent Labs  09/10/16 0503 09/11/16 0551  WBC 8.4 10.2  HGB 12.6 10.7*  HCT 41.5 35.6*  PLT 272 249   BMET  Recent Labs  09/10/16 0503 09/11/16 0551  NA 142 140  K 3.8 3.2*  CL 105 107  CO2 27 24  GLUCOSE 262* 137*  BUN 18 24*  CREATININE 0.93 0.85  CALCIUM 9.8 8.5*   PT/INR No results for input(s): LABPROT, INR in the last 72 hours.  Studies/Results: Ct Abdomen Pelvis W Contrast  Result Date: 09/10/2016 CLINICAL DATA:  History of oral carcinoma. Abdominal pain with nausea and vomiting beginning last night. EXAM: CT ABDOMEN AND PELVIS WITH CONTRAST TECHNIQUE: Multidetector CT imaging of the abdomen and pelvis was performed using the standard protocol following bolus administration of intravenous contrast. CONTRAST:  112mL ISOVUE-300 IOPAMIDOL (ISOVUE-300) INJECTION 61% COMPARISON:  05/14/2016 FINDINGS: Lower chest: Mild basilar atelectasis. No pleural or pericardial fluid. Hepatobiliary: No liver parenchymal lesion. Previous cholecystectomy. Pancreas: Normal Spleen: Normal Adrenals/Urinary Tract: Adrenal glands are normal. Kidneys are normal. No cyst, mass, stone or hydronephrosis. Normal  appearance of the bladder. Stomach/Bowel: Small bowel obstruction, fairly high-grade with March caliber change in the mid ileum. Normal appearing appendix. Free intraperitoneal fluid, small in volume. Some strandiness of the mesentery in the right mid abdomen probably related to some fluid distribution. The possibility of peritoneal nodules is not completely excluded. Vascular/Lymphatic: Aortic atherosclerosis. No aneurysm. IVC is normal. No retroperitoneal adenopathy. Reproductive: Previous hysterectomy.  No pelvic mass. Other: No free air. Musculoskeletal: Chronic lower lumbar degenerative changes. Previous decompressive surgery. IMPRESSION: Acute high-grade small bowel obstruction, probably in the mid ileum. Small a moderate amount of free intraperitoneal fluid. Some density in the mesenteric in the right abdomen the probably represents fluid distribution, but the possibility of peritoneal nodules is not completely excluded. Electronically Signed   By: Nelson Chimes M.D.   On: 09/10/2016 08:00   Dg Chest Port 1 View  Result Date: 09/10/2016 CLINICAL DATA:  Nasogastric placement. EXAM: PORTABLE CHEST 1 VIEW COMPARISON:  Earlier same day FINDINGS: Nasogastric tube enters the stomach. Tip is not visible but this extends at least to the mid body. Slightly more prominent atelectasis at the right lung base. IMPRESSION: Nasogastric tube enters the stomach with the tip at least at the mid body of the stomach. Slight worsening of right lower lobe atelectasis. Electronically Signed   By: Nelson Chimes M.D.   On: 09/10/2016 14:09   Dg Abdomen Acute W/chest  Result Date: 09/10/2016 CLINICAL DATA:  66 y/o  F; abdominal pain with nausea and vomiting. EXAM: DG ABDOMEN ACUTE W/ 1V CHEST COMPARISON:  05/14/2016 CT abdomen and pelvis FINDINGS: Stable normal cardiac silhouette. Aortic atherosclerosis with  calcification. A focal low lung volumes accentuate pulmonary markings. Minor bibasilar atelectasis. No pleural effusion or  pneumothorax. Right upper quadrant surgical clips, presumably cholecystectomy. Mildly dilated loops of small bowel in the left hemiabdomen. IMPRESSION: 1. Low lung volumes and minor bibasilar atelectasis. 2. Mildly dilated loops of small bowel in left hemiabdomen may represent obstruction or ileus. Electronically Signed   By: Kristine Garbe M.D.   On: 09/10/2016 06:04    Anti-infectives: Anti-infectives    Start     Dose/Rate Route Frequency Ordered Stop   09/10/16 2200  ciprofloxacin (CIPRO) IVPB 400 mg     400 mg 200 mL/hr over 60 Minutes Intravenous Every 12 hours 09/10/16 1213     09/10/16 1800  metroNIDAZOLE (FLAGYL) IVPB 500 mg     500 mg 100 mL/hr over 60 Minutes Intravenous Every 8 hours 09/10/16 1213     09/10/16 0845  ciprofloxacin (CIPRO) IVPB 400 mg     400 mg 200 mL/hr over 60 Minutes Intravenous  Once 09/10/16 0831 09/10/16 1015   09/10/16 0845  metroNIDAZOLE (FLAGYL) IVPB 500 mg     500 mg 100 mL/hr over 60 Minutes Intravenous  Once 09/10/16 0831 09/10/16 1130      Assessment/Plan: Impression: Small bowel obstruction versus ileus. No emesis since admission. No need for acute surgical intervention at this time. Will continue to follow with you.  LOS: 1 day    [name] 09/11/2016

## 2016-09-11 NOTE — Care Management Note (Signed)
Case Management Note  Patient Details  Name: [name] MRN: [id number] Date of Birth: 18-Apr-1950  Subjective/Objective:                  Admitted with SBO. Pt from home, lives alone and has aid 34 hrs/7 days a week. She has family but they are not a support system for her. She uses a walker with ambulation. She has [place] nursing through [facility], Clarise Cruz (rep) aware of admission. Pt plans to return home with care of her aid and [facility] nursing at [place]. Pt remains NPO with NGT to stuction.   Action/Plan: CM will cont to follow.   Expected Discharge Date:      09/14/2016            Expected Discharge Plan:  [place]  In-House Referral:  NA  Discharge planning Services  CM Consult  Post Acute Care Choice:  Home Health, Resumption of Svcs/PTA Provider Choice offered to:  Patient  HH Arranged:  RN [facility] Agency:  [place]  Status of Service:  In process, will continue to follow  Sherald Barge, RN 09/11/2016, 12:55 PM

## 2016-09-11 NOTE — Progress Notes (Signed)
Patient not using CPAP at this time due to NG tube being in place.

## 2016-09-12 LAB — BASIC METABOLIC PANEL
Anion gap: 11 (ref 5–15)
BUN: 14 mg/dL (ref 6–20)
CO2: 24 mmol/L (ref 22–32)
CREATININE: 0.69 mg/dL (ref 0.44–1.00)
Calcium: 9 mg/dL (ref 8.9–10.3)
Chloride: 103 mmol/L (ref 101–111)
GFR calc non Af Amer: >60 mL/min (ref >60)
Glucose, Bld: 161 mg/dL — ABNORMAL HIGH (ref 65–99)
Potassium: 3.8 mmol/L (ref 3.5–5.1)
SODIUM: 138 mmol/L (ref 135–145)

## 2016-09-12 LAB — GLUCOSE, CAPILLARY
Glucose-Capillary: 174 mg/dL — ABNORMAL HIGH (ref 65–99)
Glucose-Capillary: 148 mg/dL — ABNORMAL HIGH (ref 65–99)
Glucose-Capillary: 144 mg/dL — ABNORMAL HIGH (ref 65–99)
Glucose-Capillary: 157 mg/dL — ABNORMAL HIGH (ref 65–99)
Glucose-Capillary: 170 mg/dL — ABNORMAL HIGH (ref 65–99)

## 2016-09-12 MED ORDER — LEVOTHYROXINE SODIUM 100 MCG IV SOLR
25.0000 ug | Freq: Every day | INTRAVENOUS | Status: DC
  Administered 2016-09-12 – 2016-09-18 (×6): 25 ug via INTRAVENOUS
  Filled 2016-09-12 (×9): qty 5

## 2016-09-12 MED ORDER — MILK AND MOLASSES ENEMA
1.0000 | Freq: Once | RECTAL | Status: AC
  Administered 2016-09-12: 250 mL via RECTAL

## 2016-09-12 NOTE — Progress Notes (Signed)
Milk and molasses enema given this AM with no results.  Patient did pass small amount of gas.

## 2016-09-12 NOTE — Progress Notes (Signed)
PROGRESS NOTE    [name]  WUJ:[id number] DOB: Jan 07, 1951 DOA: 09/10/2016 PCP: [name], MD    Brief Narrative:  66 year old female admitted to the hospital with abdominal pain and vomiting. Found to have small bowel obstruction on CT scan. Admitted for supportive management . general surgery following.   Assessment & Plan:   Principal Problem:   SBO (small bowel obstruction) (HCC) Active Problems:   Hyperlipidemia   GERD   Obstructive sleep apnea   Bipolar disorder (HCC)   Hypothyroidism   COPD (chronic obstructive pulmonary disease) with chronic bronchitis ([place])   Poorly controlled type 2 diabetes mellitus with circulatory disorder ([place])   Labile hypertension   1. Small bowel obstruction. CT scan of the abdomen and pelvis on admission indicated small bowel obstruction. She has prior history of cholecystectomy. General surgery is following. She's not had any recovery of bowel function yet. Continue supportive management with NG tube decompression. Encourage ambulation 2. COPD. No evidence of decompensation at this time. Continue on bronchodilators.  3. Hypertension. She is on multiple agents which are on hold due to nothing by mouth status. Continue IV Lopressor and hydralazine when necessary 4. Diabetes. Continue Lantus and sliding scale. Blood sugars have been stable. 5. Hypothyroidism. Start on IV synthroid. 6. Bipolar disorder. She normally takes Risperdal daily at bedtime. Since she is nothing by mouth, she'll receive Haldol daily at bedtime. 7. GERD. Continue on PPI 8. Obstructive sleep apnea. Patient uses nightly CPAP.   DVT prophylaxis: Lovenox Code Status: Full code Family Communication: Discussed with family at the bedside Disposition Plan: Discharge home once improved   Consultants:   Gen. surgery  Procedures:     Antimicrobials:       Subjective: She's not had a bowel movement. No flatus. NG tube with low  output.  Objective: Vitals:   09/11/16 2017 09/11/16 2018 09/12/16 0440 09/12/16 0743  BP: (!) 178/84  (!) 190/68   Pulse: 90  (!) 111   Resp: 18  18   Temp: 99 F (37.2 C)  99.2 F (37.3 C)   TempSrc: Oral  Oral   SpO2: 97% 98% 97% 98%  Weight:      Height:        Intake/Output Summary (Last 24 hours) at 09/12/16 1134 Last data filed at 09/12/16 1031  Gross per 24 hour  Intake          1318.33 ml  Output              850 ml  Net           468.33 ml   Filed Weights   09/10/16 0423 09/10/16 1144  Weight: 71.2 kg (157 lb) 71.7 kg (158 lb 1.6 oz)    Examination:  General exam: Alert, awake, oriented x 3 Respiratory system: Clear to auscultation. Respiratory effort normal. Cardiovascular system:RRR. No murmurs, rubs, gallops. Gastrointestinal system: Abdomen is distended, soft and Diffusely tender. No organomegaly or masses felt. Normal bowel sounds heard. Central nervous system: Alert and oriented. No focal neurological deficits. Extremities: No C/C/E, +pedal pulses Skin: No rashes, lesions or ulcers Psychiatry: Judgement and insight appear normal. Mood & affect appropriate.      Data Reviewed: I have personally reviewed following labs and imaging studies  CBC:  Recent Labs Lab 09/10/16 0503 09/11/16 0551  WBC 8.4 10.2  NEUTROABS 6.9  --   HGB 12.6 10.7*  HCT 41.5 35.6*  MCV 73.3* 74.3*  PLT 272 249   Basic  Metabolic Panel:  Recent Labs Lab 09/10/16 0503 09/11/16 0551 09/12/16 0631  NA 142 140 138  K 3.8 3.2* 3.8  CL 105 107 103  CO2 27 24 24   GLUCOSE 262* 137* 161*  BUN 18 24* 14  CREATININE 0.93 0.85 0.69  CALCIUM 9.8 8.5* 9.0   GFR: Estimated Creatinine Clearance: 59.6 mL/min (by C-G formula based on SCr of 0.69 mg/dL). Liver Function Tests:  Recent Labs Lab 09/10/16 0503  AST 29  ALT 28  ALKPHOS 127*  BILITOT 0.6  PROT 8.1  ALBUMIN 4.2    Recent Labs Lab 09/10/16 0503  LIPASE 23   No results for input(s): AMMONIA in the  last 168 hours. Coagulation Profile: No results for input(s): INR, PROTIME in the last 168 hours. Cardiac Enzymes: No results for input(s): CKTOTAL, CKMB, CKMBINDEX, TROPONINI in the last 168 hours. BNP (last 3 results) No results for input(s): PROBNP in the last 8760 hours. HbA1C: No results for input(s): HGBA1C in the last 72 hours. CBG:  Recent Labs Lab 09/11/16 2015 09/11/16 2349 09/12/16 0345 09/12/16 0739 09/12/16 1116  GLUCAP 120* 173* 174* 148* 144*   Lipid Profile: No results for input(s): CHOL, HDL, LDLCALC, TRIG, CHOLHDL, LDLDIRECT in the last 72 hours. Thyroid Function Tests: No results for input(s): TSH, T4TOTAL, FREET4, T3FREE, THYROIDAB in the last 72 hours. Anemia Panel: No results for input(s): VITAMINB12, FOLATE, FERRITIN, TIBC, IRON, RETICCTPCT in the last 72 hours. Sepsis Labs: No results for input(s): PROCALCITON, LATICACIDVEN in the last 168 hours.  No results found for this or any previous visit (from the past 240 hour(s)).       Radiology Studies: Dg Chest Port 1 View  Result Date: 09/10/2016 CLINICAL DATA:  Nasogastric placement. EXAM: PORTABLE CHEST 1 VIEW COMPARISON:  Earlier same day FINDINGS: Nasogastric tube enters the stomach. Tip is not visible but this extends at least to the mid body. Slightly more prominent atelectasis at the right lung base. IMPRESSION: Nasogastric tube enters the stomach with the tip at least at the mid body of the stomach. Slight worsening of right lower lobe atelectasis. Electronically Signed   By: Nelson Chimes M.D.   On: 09/10/2016 14:09        Scheduled Meds: . Azelastine-Fluticasone  1 puff Nasal QHS  . bisacodyl  10 mg Rectal BID  . clobetasol ointment  1 application Topical Daily  . cycloSPORINE  1 drop Both Eyes BID  . enoxaparin (LOVENOX) injection  40 mg Subcutaneous Q24H  . haloperidol lactate  2 mg Intravenous QHS  . insulin aspart  0-15 Units Subcutaneous Q4H  . insulin glargine  10 Units  Subcutaneous QHS  . ipratropium-albuterol  3 mL Nebulization TID  . metoprolol tartrate  5 mg Intravenous Q6H  . milk and molasses  1 enema Rectal Once  . pantoprazole (PROTONIX) IV  40 mg Intravenous Q24H   Continuous Infusions: . 0.9 % NaCl with KCl 20 mEq / L 50 mL/hr at 09/12/16 0211     LOS: 2 days    Time spent: 65mins    [name] Zingale, MD [facility] Pager [phone]  If 7PM-7AM, please contact night-coverage [email or web address] Password [email or web address] 09/12/2016, 11:34 AM

## 2016-09-12 NOTE — Progress Notes (Signed)
  Subjective: No bowel movement or flatus yet. Still with mild abdominal discomfort.  Objective: Vital signs in last 24 hours: Temp:  [97.5 F (36.4 C)-99.2 F (37.3 C)] 99.2 F (37.3 C) (07/03 0440) Pulse Rate:  [90-111] 111 (07/03 0440) Resp:  [18-19] 18 (07/03 0440) BP: (178-201)/(68-94) 190/68 (07/03 0440) SpO2:  [97 %-98 %] 98 % (07/03 0743)    Intake/Output from previous day: 07/02 0701 - 07/03 0700 In: 1318.3 [I.V.:1218.3; IV Piggyback:100] Out: 550 [Urine:300; Emesis/NG output:250] Intake/Output this shift: No intake/output data recorded.  General appearance: alert, cooperative and no distress GI: Soft and not particularly distended. No specific tenderness noted. No rigidity noted. Rare bowel sounds appreciated.  Lab Results:   Recent Labs  09/10/16 0503 09/11/16 0551  WBC 8.4 10.2  HGB 12.6 10.7*  HCT 41.5 35.6*  PLT 272 249   BMET  Recent Labs  09/11/16 0551 09/12/16 0631  NA 140 138  K 3.2* 3.8  CL 107 103  CO2 24 24  GLUCOSE 137* 161*  BUN 24* 14  CREATININE 0.85 0.69  CALCIUM 8.5* 9.0   PT/INR No results for input(s): LABPROT, INR in the last 72 hours.  Studies/Results: Dg Chest Port 1 View  Result Date: 09/10/2016 CLINICAL DATA:  Nasogastric placement. EXAM: PORTABLE CHEST 1 VIEW COMPARISON:  Earlier same day FINDINGS: Nasogastric tube enters the stomach. Tip is not visible but this extends at least to the mid body. Slightly more prominent atelectasis at the right lung base. IMPRESSION: Nasogastric tube enters the stomach with the tip at least at the mid body of the stomach. Slight worsening of right lower lobe atelectasis. Electronically Signed   By: Nelson Chimes M.D.   On: 09/10/2016 14:09    Anti-infectives: Anti-infectives    Start     Dose/Rate Route Frequency Ordered Stop   09/10/16 2200  ciprofloxacin (CIPRO) IVPB 400 mg  Status:  Discontinued     400 mg 200 mL/hr over 60 Minutes Intravenous Every 12 hours 09/10/16 1213 09/11/16  1227   09/10/16 1800  metroNIDAZOLE (FLAGYL) IVPB 500 mg  Status:  Discontinued     500 mg 100 mL/hr over 60 Minutes Intravenous Every 8 hours 09/10/16 1213 09/11/16 1227   09/10/16 0845  ciprofloxacin (CIPRO) IVPB 400 mg     400 mg 200 mL/hr over 60 Minutes Intravenous  Once 09/10/16 0831 09/10/16 1015   09/10/16 0845  metroNIDAZOLE (FLAGYL) IVPB 500 mg     500 mg 100 mL/hr over 60 Minutes Intravenous  Once 09/10/16 0831 09/10/16 1130      Assessment/Plan: Impression: Small bowel obstruction versus ileus. NG tube in place now. Plan: Continue NG tube decompression. Will give enema.  LOS: 2 days    Aviva Signs 09/12/2016

## 2016-09-13 LAB — BASIC METABOLIC PANEL
ANION GAP: 13 (ref 5–15)
BUN: 18 mg/dL (ref 6–20)
CALCIUM: 9.2 mg/dL (ref 8.9–10.3)
CO2: 22 mmol/L (ref 22–32)
Chloride: 108 mmol/L (ref 101–111)
Creatinine, Ser: 0.66 mg/dL (ref 0.44–1.00)
Glucose, Bld: 132 mg/dL — ABNORMAL HIGH (ref 65–99)
Potassium: 3.8 mmol/L (ref 3.5–5.1)
Sodium: 143 mmol/L (ref 135–145)

## 2016-09-13 LAB — GLUCOSE, CAPILLARY
GLUCOSE-CAPILLARY: 123 mg/dL — AB (ref 65–99)
GLUCOSE-CAPILLARY: 124 mg/dL — AB (ref 65–99)
GLUCOSE-CAPILLARY: 161 mg/dL — AB (ref 65–99)
Glucose-Capillary: 138 mg/dL — ABNORMAL HIGH (ref 65–99)
Glucose-Capillary: 177 mg/dL — ABNORMAL HIGH (ref 65–99)
Glucose-Capillary: 193 mg/dL — ABNORMAL HIGH (ref 65–99)
Glucose-Capillary: 132 mg/dL — ABNORMAL HIGH (ref 65–99)

## 2016-09-13 MED ORDER — METOPROLOL TARTRATE 5 MG/5ML IV SOLN
5.0000 mg | Freq: Once | INTRAVENOUS | Status: AC
  Administered 2016-09-13: 5 mg via INTRAVENOUS
  Filled 2016-09-13: qty 5

## 2016-09-13 MED ORDER — LORAZEPAM 2 MG/ML IJ SOLN
0.5000 mg | Freq: Three times a day (TID) | INTRAMUSCULAR | Status: DC
  Administered 2016-09-13 – 2016-09-14 (×3): 0.5 mg via INTRAVENOUS
  Filled 2016-09-13 (×3): qty 1

## 2016-09-13 MED ORDER — HYDRALAZINE HCL 20 MG/ML IJ SOLN
5.0000 mg | INTRAMUSCULAR | Status: DC
  Administered 2016-09-13 – 2016-09-14 (×4): 5 mg via INTRAVENOUS
  Filled 2016-09-13 (×4): qty 1

## 2016-09-13 MED ORDER — SODIUM CHLORIDE 0.9 % IV BOLUS (SEPSIS)
500.0000 mL | Freq: Once | INTRAVENOUS | Status: AC
  Administered 2016-09-13: 500 mL via INTRAVENOUS

## 2016-09-13 MED ORDER — RISPERIDONE 0.5 MG PO TABS: 0.5000 mg | ORAL_TABLET | Freq: Every day | ORAL | Status: DC

## 2016-09-13 MED ORDER — SODIUM CHLORIDE 0.9 % IV BOLUS (SEPSIS)
250.0000 mL | Freq: Once | INTRAVENOUS | Status: AC
  Administered 2016-09-13: 250 mL via INTRAVENOUS

## 2016-09-13 MED ORDER — TRAZODONE HCL 50 MG PO TABS: 150.0000 mg | ORAL_TABLET | Freq: Every day | ORAL | Status: DC

## 2016-09-13 MED ORDER — SODIUM CHLORIDE 0.9 % IV BOLUS (SEPSIS): 500.0000 mL | Freq: Once | INTRAVENOUS | Status: DC

## 2016-09-13 MED ORDER — LORAZEPAM 0.5 MG PO TABS
0.5000 mg | ORAL_TABLET | Freq: Three times a day (TID) | ORAL | Status: DC
  Filled 2016-09-13: qty 1

## 2016-09-13 NOTE — Progress Notes (Signed)
Informed by central telemetry that patient was sustaining HR in 130's Pt assessed and denied any chest pain or discomfort at this time. Pt's Vital signs are as follows    09/13/16 1915  Vitals  Temp 97.6 F (36.4 C)  Temp Source Oral  BP (!) 168/137  BP Location Left Arm  BP Method Automatic  Patient Position (if appropriate) Lying  Pulse Rate (!) 125  Pulse Rate Source Dinamap  Cardiac Rhythm ST  Resp 18  Oxygen Therapy  SpO2 100 %  Pain Assessment  Pain Assessment No/denies pain  Pain Score 0   Dr. Hilbert Bible E-Paged and received order for 12 Lead EKG.

## 2016-09-13 NOTE — Progress Notes (Signed)
Informed by central telemetry that patient's order for tele has expired. MD E-Paged. No further instructions at this time.

## 2016-09-13 NOTE — Progress Notes (Signed)
Informed by Dr. Arnoldo Morale that pt may have sips of Ginger Ale to make her comfortable. Pt informed that she may only have sips.

## 2016-09-13 NOTE — Progress Notes (Signed)
  Subjective: Patient complains of dry throat. No bowel movement or flatus yet.  Objective: Vital signs in last 24 hours: Temp:  [97.8 F (36.6 C)-98.4 F (36.9 C)] 98.4 F (36.9 C) (07/04 0439) Pulse Rate:  [97-104] 103 (07/04 0439) Resp:  [16-18] 18 (07/04 0439) BP: (185-207)/(70-77) 207/77 (07/04 0439) SpO2:  [97 %-98 %] 97 % (07/04 0750)    Intake/Output from previous day: 07/03 0701 - 07/04 0700 In: 1200 [I.V.:1200] Out: 700 [Urine:300; Emesis/NG output:400] Intake/Output this shift: No intake/output data recorded.  General appearance: alert, cooperative and no distress GI: Soft, not particularly distended. No tenderness noted. Minimal bowel sounds appreciated.  Lab Results:   Recent Labs  09/11/16 0551  WBC 10.2  HGB 10.7*  HCT 35.6*  PLT 249   BMET  Recent Labs  09/12/16 0631 09/13/16 0547  NA 138 143  K 3.8 3.8  CL 103 108  CO2 24 22  GLUCOSE 161* 132*  BUN 14 18  CREATININE 0.69 0.66  CALCIUM 9.0 9.2   PT/INR No results for input(s): LABPROT, INR in the last 72 hours.  Studies/Results: No results found.  Anti-infectives: Anti-infectives    Start     Dose/Rate Route Frequency Ordered Stop   09/10/16 2200  ciprofloxacin (CIPRO) IVPB 400 mg  Status:  Discontinued     400 mg 200 mL/hr over 60 Minutes Intravenous Every 12 hours 09/10/16 1213 09/11/16 1227   09/10/16 1800  metroNIDAZOLE (FLAGYL) IVPB 500 mg  Status:  Discontinued     500 mg 100 mL/hr over 60 Minutes Intravenous Every 8 hours 09/10/16 1213 09/11/16 1227   09/10/16 0845  ciprofloxacin (CIPRO) IVPB 400 mg     400 mg 200 mL/hr over 60 Minutes Intravenous  Once 09/10/16 0831 09/10/16 1015   09/10/16 0845  metroNIDAZOLE (FLAGYL) IVPB 500 mg     500 mg 100 mL/hr over 60 Minutes Intravenous  Once 09/10/16 0831 09/10/16 1130      Assessment/Plan: Impression: No return of bowel function yet. Ileus versus bowel obstruction. Plan: Patient may have sips of clears. If we see no  improvement over the next 24 hours, will get CT scan of abdomen with contrast.  LOS: 3 days    Aviva Signs 09/13/2016

## 2016-09-13 NOTE — Progress Notes (Signed)
Pt's Vital signs as follows    09/13/16 1833  Vitals  BP (!) 193/89  BP Location Left Arm  BP Method Automatic  Patient Position (if appropriate) Lying  Pulse Rate 97  Pulse Rate Source Dinamap  Resp 18  Oxygen Therapy  SpO2 100 %  O2 Device Room Air   PRN Hydralazine 10 mg given for B/P 193/89. Will continue to monitor patient.

## 2016-09-13 NOTE — Progress Notes (Signed)
At 2150 tele called, pt HR 150, pt was up to bedside commode. MD made aware and Lopressor 5 mg given. Denies chest pain, will continue to monitor.

## 2016-09-13 NOTE — Progress Notes (Signed)
PROGRESS NOTE  [name]  QMG:[id number] DOB: Feb 03, 1951 DOA: 09/10/2016   PCP: [name], MD   Brief Narrative:  Patient is 66 year old female admitted to the hospital with several days duration of progressively worsening abdominal pain associated with nausea and vomiting, poor oral intake. Found to have small bowel obstruction on CT scan and admitted for supportive management.  Assessment & Plan:   Small bowel obstruction - Rather slow clinical improvement, still rather faint bowel sounds - NG tube in place, minimal drainage - Patient wants to try ice chips and some ginger ale, okay per surgery team for now with close monitoring - Encouraged ambulation  COPD, obstructive sleep apnea - No signs of acute exacerbation  Hypertension, accelerated - Keep on IV metoprolol for now until patient able to tolerate by mouth - Patient is also on IV hydralazine as needed  Diabetes mellitus type 2 with long-term use of insulin - Reasonably stable so far  Hypothyroidism - Continue IV Synthroid  Bipolar disorder - On risperidone at home but currently nothing by mouth so will hold home regimen for now - Clinical status stable  DVT prophylaxis: Lovenox SQ Code Status: Full code Family Communication: Patient updated at bedside Disposition Plan: Home once cleared by surgery team   Consultants:   Gen. surgery  Procedures:   None  Antimicrobials:    None  Subjective: Patient reports no significant change. Would like to try some ice chips and ginger ale  Objective: Vitals:   09/12/16 2010 09/12/16 2018 09/13/16 0439 09/13/16 0750  BP:  (!) 187/70 (!) 207/77   Pulse:  97 (!) 103   Resp:  18 18   Temp:  98.3 F (36.8 C) 98.4 F (36.9 C)   TempSrc:  Oral Oral   SpO2: 97% 98% 97% 97%  Weight:      Height:        Intake/Output Summary (Last 24 hours) at 09/13/16 1033 Last data filed at 09/13/16 1029  Gross per 24 hour  Intake             1200 ml  Output               600 ml  Net              600 ml   Filed Weights   09/10/16 0423 09/10/16 1144  Weight: 71.2 kg (157 lb) 71.7 kg (158 lb 1.6 oz)    Physical Exam  Constitutional: Appears calm, NAD CVS: RRR, S1/S2 +, no murmurs, no gallops, no carotid bruit.  Pulmonary: Effort and breath sounds normal, no stridor, rhonchi, wheezes, rales.  Abdominal: Soft, no significant abdominal distention, no tenderness, minimal bowel sounds noted  Musculoskeletal: Normal range of motion. No edema and no tenderness.  Lymphadenopathy: No lymphadenopathy noted, cervical, inguinal. Neuro: Alert. Normal reflexes, muscle tone coordination. No cranial nerve deficit. Skin: Skin is warm and dry. No rash noted. Not diaphoretic. No erythema. No pallor.  Psychiatric: Normal mood and affect. Behavior, judgment, thought content normal.    Data Reviewed: I have personally reviewed following labs and imaging studies  CBC:  Recent Labs Lab 09/10/16 0503 09/11/16 0551  WBC 8.4 10.2  NEUTROABS 6.9  --   HGB 12.6 10.7*  HCT 41.5 35.6*  MCV 73.3* 74.3*  PLT 272 326   Basic Metabolic Panel:  Recent Labs Lab 09/10/16 0503 09/11/16 0551 09/12/16 0631 09/13/16 0547  NA 142 140 138 143  K 3.8 3.2* 3.8 3.8  CL 105  107 103 108  CO2 27 24 24 22   GLUCOSE 262* 137* 161* 132*  BUN 18 24* 14 18  CREATININE 0.93 0.85 0.69 0.66  CALCIUM 9.8 8.5* 9.0 9.2   GFR: Estimated Creatinine Clearance: 59.6 mL/min (by C-G formula based on SCr of 0.66 mg/dL). Liver Function Tests:  Recent Labs Lab 09/10/16 0503  AST 29  ALT 28  ALKPHOS 127*  BILITOT 0.6  PROT 8.1  ALBUMIN 4.2    Recent Labs Lab 09/10/16 0503  LIPASE 23   CBG:  Recent Labs Lab 09/12/16 1614 09/12/16 2016 09/13/16 0136 09/13/16 0437 09/13/16 0749  GLUCAP 157* 170* 138* 177* 123*   Radiology Studies: No results found.  Scheduled Meds: . Azelastine-Fluticasone  1 puff Nasal QHS  . bisacodyl  10 mg Rectal BID  . clobetasol ointment  1  application Topical Daily  . cycloSPORINE  1 drop Both Eyes BID  . enoxaparin (LOVENOX) injection  40 mg Subcutaneous Q24H  . haloperidol lactate  2 mg Intravenous QHS  . insulin aspart  0-15 Units Subcutaneous Q4H  . insulin glargine  10 Units Subcutaneous QHS  . ipratropium-albuterol  3 mL Nebulization TID  . levothyroxine  25 mcg Intravenous QAC breakfast  . metoprolol tartrate  5 mg Intravenous Q6H  . pantoprazole (PROTONIX) IV  40 mg Intravenous Q24H   Continuous Infusions: . 0.9 % NaCl with KCl 20 mEq / L 50 mL/hr at 09/12/16 2111     LOS: 3 days   Time spent: 25 minutes   Faye Ramsay, MD [facility] Pager [phone]  If 7PM-7AM, please contact night-coverage [email or web address] Password [email or web address] 09/13/2016, 10:33 AM

## 2016-09-14 DIAGNOSIS — I10 Essential (primary) hypertension: Secondary | ICD-10-CM

## 2016-09-14 DIAGNOSIS — R000 Tachycardia, unspecified: Secondary | ICD-10-CM

## 2016-09-14 LAB — BASIC METABOLIC PANEL
Anion gap: 12 (ref 5–15)
BUN: 16 mg/dL (ref 6–20)
CALCIUM: 9.1 mg/dL (ref 8.9–10.3)
CO2: 20 mmol/L — ABNORMAL LOW (ref 22–32)
Chloride: 107 mmol/L (ref 101–111)
Creatinine, Ser: 0.59 mg/dL (ref 0.44–1.00)
GFR calc Af Amer: >60 mL/min (ref >60)
GLUCOSE: 164 mg/dL — AB (ref 65–99)
Potassium: 3.6 mmol/L (ref 3.5–5.1)
Sodium: 139 mmol/L (ref 135–145)

## 2016-09-14 LAB — CBC
HCT: 40.8 % (ref 36.0–46.0)
Hemoglobin: 12.5 g/dL (ref 12.0–15.0)
MCH: 22.4 pg — AB (ref 26.0–34.0)
MCHC: 30.6 g/dL (ref 30.0–36.0)
MCV: 73.1 fL — ABNORMAL LOW (ref 78.0–100.0)
PLATELETS: 277 10*3/uL (ref 150–400)
RBC: 5.58 MIL/uL — ABNORMAL HIGH (ref 3.87–5.11)
RDW: 14.5 % (ref 11.5–15.5)
WBC: 13.3 10*3/uL — AB (ref 4.0–10.5)

## 2016-09-14 LAB — GLUCOSE, CAPILLARY
GLUCOSE-CAPILLARY: 144 mg/dL — AB (ref 65–99)
GLUCOSE-CAPILLARY: 174 mg/dL — AB (ref 65–99)
Glucose-Capillary: 169 mg/dL — ABNORMAL HIGH (ref 65–99)
Glucose-Capillary: 154 mg/dL — ABNORMAL HIGH (ref 65–99)
Glucose-Capillary: 173 mg/dL — ABNORMAL HIGH (ref 65–99)

## 2016-09-14 MED ORDER — LORAZEPAM 2 MG/ML IJ SOLN
0.5000 mg | Freq: Four times a day (QID) | INTRAMUSCULAR | Status: DC | PRN
  Administered 2016-09-14 – 2016-09-16 (×5): 1 mg via INTRAVENOUS
  Administered 2016-09-17: 0.5 mg via INTRAVENOUS
  Filled 2016-09-14 (×6): qty 1

## 2016-09-14 MED ORDER — METOPROLOL TARTRATE 5 MG/5ML IV SOLN: 5.0000 mg | Freq: Four times a day (QID) | INTRAVENOUS | Status: DC | PRN

## 2016-09-14 MED ORDER — TRAZODONE HCL 50 MG PO TABS
150.0000 mg | ORAL_TABLET | Freq: Every day | ORAL | Status: DC
  Administered 2016-09-14 – 2016-09-17 (×4): 150 mg via ORAL
  Filled 2016-09-14 (×4): qty 3

## 2016-09-14 MED ORDER — LABETALOL HCL 5 MG/ML IV SOLN
10.0000 mg | Freq: Four times a day (QID) | INTRAVENOUS | Status: DC
  Administered 2016-09-14 – 2016-09-18 (×16): 10 mg via INTRAVENOUS
  Filled 2016-09-14 (×17): qty 4

## 2016-09-14 MED ORDER — LEVALBUTEROL HCL 1.25 MG/0.5ML IN NEBU: 1.2500 mg | INHALATION_SOLUTION | Freq: Four times a day (QID) | RESPIRATORY_TRACT | Status: DC

## 2016-09-14 MED ORDER — PANTOPRAZOLE SODIUM 40 MG PO TBEC
40.0000 mg | DELAYED_RELEASE_TABLET | Freq: Every day | ORAL | Status: DC
  Administered 2016-09-14 – 2016-09-18 (×5): 40 mg via ORAL
  Filled 2016-09-14 (×5): qty 1

## 2016-09-14 MED ORDER — LEVALBUTEROL HCL 1.25 MG/0.5ML IN NEBU: 1.2500 mg | INHALATION_SOLUTION | Freq: Four times a day (QID) | RESPIRATORY_TRACT | Status: DC | PRN

## 2016-09-14 MED ORDER — LEVALBUTEROL HCL 1.25 MG/0.5ML IN NEBU
1.2500 mg | INHALATION_SOLUTION | Freq: Three times a day (TID) | RESPIRATORY_TRACT | Status: DC
  Administered 2016-09-14 – 2016-09-18 (×12): 1.25 mg via RESPIRATORY_TRACT
  Filled 2016-09-14 (×12): qty 0.5

## 2016-09-14 MED ORDER — HYDRALAZINE HCL 20 MG/ML IJ SOLN
10.0000 mg | Freq: Once | INTRAMUSCULAR | Status: AC
  Administered 2016-09-14: 10 mg via INTRAVENOUS
  Filled 2016-09-14: qty 1

## 2016-09-14 MED ORDER — HYDRALAZINE HCL 20 MG/ML IJ SOLN
10.0000 mg | Freq: Four times a day (QID) | INTRAMUSCULAR | Status: DC
  Administered 2016-09-14 – 2016-09-18 (×14): 10 mg via INTRAVENOUS
  Filled 2016-09-14 (×16): qty 1

## 2016-09-14 NOTE — Evaluation (Signed)
Physical Therapy Evaluation Patient Details Name: [name] MRN: [id number] DOB: 01-Jul-1950 Today's Date: 09/14/2016   History of Present Illness  [name] is a 66yo black female who comes to [facility] on 7/1 with ABD pain assicated with emesis. Pt is found to have SBO, placed on NPO with g-tube, today is without g-tube, clear liquid diet. Pt lives alone with aide services 7d/week. PMH: COPD, bipolar, HTN, DM, cervical radiculopathy, CVA c Lt hemiplegia, OSA c non compliance.   Clinical Impression  Pt admitted with above diagnosis. Pt currently with functional limitations due to the deficits listed below (see "PT Problem List"). Upon entry, the patient is received semirecumbent in bed, niece present. The pt is awake, somewhat drowsy in appearance, but agreeable to participate. Pt is tacycardic and hypertensive upon arrival (192/26mmHg), and after AMB trial 183/34mmHg. HR is 119bpm at rest, increasing to 140bpm NSR with very slow AMB in room, recovers to 126bpm within 2 minutes sitting. Pt denies CP, SOB, no noted diaphoresis, but she endorses some stable dizziness with activity. The pt is alert and oriented x3, pleasant, conversational, and provides detailed history. Functional mobility assessment demonstrates baseline level bed mobility and transfers, however gait is somewhat slower than baseline, and cognition/alertness appears worse after 59ft AMB. The patient is at high risk for falls as evidence by gait speed <0.12m/s,frequent falls in home, and multiple LOB demonstrated throughout session. Pt will benefit from skilled PT intervention to increase independence and safety with basic mobility in preparation for discharge to the venue listed below.       Follow Up Recommendations Home health PT    Equipment Recommendations  None recommended by PT    Recommendations for Other Services       Precautions / Restrictions Precautions Precautions: Fall Restrictions Weight Bearing Restrictions: No       Mobility  Bed Mobility Overal bed mobility: Independent                Transfers Overall transfer level: Independent                  Ambulation/Gait Ambulation/Gait assistance: Min assist Ambulation Distance (Feet): 32 Feet Assistive device: Rolling walker (2 wheeled)   Gait velocity: <0.47m/s Gait velocity interpretation: <1.8 ft/sec, indicative of risk for recurrent falls General Gait Details: very slow, unsteady, poor safety awareness with turns.   Stairs            Wheelchair Mobility    Modified Rankin (Stroke Patients Only)       Balance Overall balance assessment: History of Falls;Needs assistance (reports to have falls about 1x monthly)                                           Pertinent Vitals/Pain Pain Assessment:  (reports mild abdominal dyscomfort )    Home Living Family/patient expects to be discharged to:: Private residence Living Arrangements: Alone Available Help at Discharge: Personal care attendant (7hrs M-F; 2hrs Sa-Su) Type of Home: Apartment Home Access: Level entry     Home Layout: One level Home Equipment: Walker - 2 wheels;Cane - single point;Bedside commode;Shower seat;Wheelchair - manual;Hospital bed      Prior Function Level of Independence: Needs assistance   Gait / Transfers Assistance Needed: limited community distance AMB with SPC  ADL's / Homemaking Assistance Needed: grocery shops with aide  Hand Dominance        Extremity/Trunk Assessment   Upper Extremity Assessment Upper Extremity Assessment: Generalized weakness;Overall [facility] for tasks assessed    Lower Extremity Assessment Lower Extremity Assessment: Generalized weakness;Overall WFL for tasks assessed       Communication   Communication: No difficulties  Cognition Arousal/Alertness: Lethargic Behavior During Therapy: Flat affect Overall Cognitive Status: Difficult to assess                                         General Comments      Exercises     Assessment/Plan    PT Assessment Patient needs continued PT services  PT Problem List Decreased activity tolerance;Decreased strength;Obesity;Decreased balance;Decreased mobility;Cardiopulmonary status limiting activity       PT Treatment Interventions Functional mobility training;Therapeutic activities;Therapeutic exercise;Balance training;Patient/family education    PT Goals (Current goals can be found in the Care Plan section)  Acute Rehab PT Goals PT Goal Formulation: Patient unable to participate in goal setting    Frequency Min 2X/week   Barriers to discharge        Co-evaluation               AM-PAC PT "6 Clicks" Daily Activity  Outcome Measure Difficulty turning over in bed (including adjusting bedclothes, sheets and blankets)?: None Difficulty moving from lying on back to sitting on the side of the bed? : None Difficulty sitting down on and standing up from a chair with arms (e.g., wheelchair, bedside commode, etc,.)?: None Help needed moving to and from a bed to chair (including a wheelchair)?: None Help needed walking in hospital room?: Total Help needed climbing 3-5 steps with a railing? : Total 6 Click Score: 18    End of Session Equipment Utilized During Treatment: Gait belt Activity Tolerance: Patient tolerated treatment well;Patient limited by fatigue;Treatment limited secondary to medical complications (Comment) Patient left: in chair;with nursing/sitter in room;with chair alarm set;with family/visitor present Nurse Communication: Mobility status PT Visit Diagnosis: Unsteadiness on feet (R26.81);Difficulty in walking, not elsewhere classified (R26.2)    Time: 2993-7169 PT Time Calculation (min) (ACUTE ONLY): 23 min   Charges:   PT Evaluation $PT Eval Moderate Complexity: 1 Procedure PT Treatments $Therapeutic Activity: 8-22 mins   PT G Codes:       3:11 PM, 2016/10/08 Etta Grandchild, PT, DPT Physical Therapist - [place] [phone] (Office)    Loreto Loescher C October 08, 2016, 3:05 PM

## 2016-09-14 NOTE — Progress Notes (Signed)
PROGRESS NOTE  [name]  GXQ:[id number] DOB: 1950/07/11 DOA: 09/10/2016   PCP: [name], MD   Brief Narrative:  Patient is 66 year old female admitted to the hospital with several days duration of progressively worsening abdominal pain associated with nausea and vomiting, poor oral intake. Found to have small bowel obstruction on CT scan and admitted for supportive management.  Assessment & Plan:   Small bowel obstruction - relatively slow improvement initially but has had BM in the past 24 hours and passing flatus  - NGT removed, diet to be advanced slowly  - encouraged ambulation - appreciate surgery team following   Leukocytosis - unclear etiology - pt with no fevers - repeat CBC in AM  COPD, obstructive sleep apnea - stable respiratory status   Hypertension, accelerated, Tachycardia  - cardiology consulted for assistance - likely exacerbated by acute illness and not taking O blood pressure meds - pt is on Norvasc, Clonidine, Losartan, Metoprolol at home but these were held as pt was NPO - cardiology placed on labetalol and hydralazine IV for now until BP stabilizes with plan to resume home regimen hopefully in next 24 hours  - appreciate cardiology team following   Diabetes mellitus type 2 with long-term use of insulin - stable for now - at home on Insulin Glargine 25 U QHS, currently getting 10 U QHS, will plan to readjust the dose as clinically indicated   Hypothyroidism - Continue IV Synthroid and plan to change to PO once we confirm pt tolerating PO off NGT   Bipolar disorder - On risperidone at home, resume possibly today or tomorrow  DVT prophylaxis: Lovenox SQ Code Status: Full code Family Communication: Patient updated at bedside Disposition Plan: Home once cleared by surgery and cardiology team    Consultants:   Gen. Surgery  Cardiology   Procedures:   None  Antimicrobials:    None  Subjective: Pt reports feeling better, denies  palpitations, no chest pain and no dyspnea.   Objective: Vitals:   09/14/16 0626 09/14/16 0741 09/14/16 0921 09/14/16 1059  BP: (!) 190/69  (!) 191/88 (!) 198/70  Pulse: (!) 130  (!) 128 (!) 131  Resp:   18   Temp:      TempSrc:      SpO2:  98%    Weight:      Height:        Intake/Output Summary (Last 24 hours) at 09/14/16 1122 Last data filed at 09/14/16 0900  Gross per 24 hour  Intake          1018.33 ml  Output              700 ml  Net           318.33 ml   Filed Weights   09/10/16 0423 09/10/16 1144  Weight: 71.2 kg (157 lb) 71.7 kg (158 lb 1.6 oz)   Physical Exam  Constitutional: Appears calm, NAD CVS: tachycardic, S1/S2 +, no gallops, no carotid bruit.  Pulmonary: Effort and breath sounds normal, no stridor, diminished air movement at bases  Abdominal: Soft. BS +,  no distension, tenderness, rebound or guarding.  Lymphadenopathy: No lymphadenopathy noted, cervical, inguinal. Neuro: Alert. Normal reflexes, muscle tone coordination. No cranial nerve deficit. Skin: Skin is warm and dry. No rash noted. Not diaphoretic. No erythema. No pallor.  Psychiatric: Normal mood and affect. Behavior, judgment, thought content normal.   Data Reviewed: I have personally reviewed following labs and imaging studies  CBC:  Recent  Labs Lab 09/10/16 0503 09/11/16 0551 09/14/16 0603  WBC 8.4 10.2 13.3*  NEUTROABS 6.9  --   --   HGB 12.6 10.7* 12.5  HCT 41.5 35.6* 40.8  MCV 73.3* 74.3* 73.1*  PLT 272 249 962   Basic Metabolic Panel:  Recent Labs Lab 09/10/16 0503 09/11/16 0551 09/12/16 0631 09/13/16 0547 09/14/16 0603  NA 142 140 138 143 139  K 3.8 3.2* 3.8 3.8 3.6  CL 105 107 103 108 107  CO2 27 24 24 22  20*  GLUCOSE 262* 137* 161* 132* 164*  BUN 18 24* 14 18 16   CREATININE 0.93 0.85 0.69 0.66 0.59  CALCIUM 9.8 8.5* 9.0 9.2 9.1   Liver Function Tests:  Recent Labs Lab 09/10/16 0503  AST 29  ALT 28  ALKPHOS 127*  BILITOT 0.6  PROT 8.1  ALBUMIN 4.2     Recent Labs Lab 09/10/16 0503  LIPASE 23   CBG:  Recent Labs Lab 09/13/16 1628 09/13/16 2039 09/13/16 2358 09/14/16 0356 09/14/16 0728  GLUCAP 132* 161* 124* 169* 154*   Radiology Studies: No results found.  Scheduled Meds: . Azelastine-Fluticasone  1 puff Nasal QHS  . bisacodyl  10 mg Rectal BID  . clobetasol ointment  1 application Topical Daily  . cycloSPORINE  1 drop Both Eyes BID  . enoxaparin (LOVENOX) injection  40 mg Subcutaneous Q24H  . haloperidol lactate  2 mg Intravenous QHS  . hydrALAZINE  10 mg Intravenous Q6H  . insulin aspart  0-15 Units Subcutaneous Q4H  . insulin glargine  10 Units Subcutaneous QHS  . ipratropium-albuterol  3 mL Nebulization TID  . labetalol  10 mg Intravenous Q6H  . levothyroxine  25 mcg Intravenous QAC breakfast  . LORazepam  0.5 mg Intravenous TID  . pantoprazole  40 mg Oral Q1200   Continuous Infusions: . 0.9 % NaCl with KCl 20 mEq / L 50 mL/hr at 09/14/16 0415     LOS: 4 days   Time spent: 25 minutes   Faye Ramsay, MD [facility] Pager [phone]  If 7PM-7AM, please contact night-coverage [email or web address] Password [email or web address] 09/14/2016, 11:22 AM

## 2016-09-14 NOTE — Consult Note (Signed)
Cardiology Consultation:   Patient ID: [name]; [id number]; 1950/07/24   Admit date: 09/10/2016 Date of Consult: 09/14/2016  Primary Care Provider: [name], MD Primary Cardiologist: Debara Pickett (former)   Patient Profile:   [name] is a 66 y.o. female with a hx of small bowel obstruction and hypertension who is being seen today for the evaluation of tachycardia and hypertension at the request of Dr. Doyle Askew.  History of Present Illness:   [name]  is a 66 y.o. female with a hx of small bowel obstruction and hypertension who is being seen today for the evaluation of tachycardia and hypertension. She has a reported h/o OSA with noncompliance with CPAP, type 2 diabetes, and bipolar disorder.  Has normal coronaries by cath in 2007.  Echocardiogram on 02/22/15 showed normal LV systolic function, EF 59-16%, mild LVH, mild MR, mild LAE, and moderately elevated pulmonary pressures (45 mmHg).  Last seen in our office by K. [facility] DNP in 10/2015.  ECG performed on 09/13/16 which I personally interpreted demonstrated sinus tachycardia, HR 118 bpm.  She was admitted on 09/10/16 with abdominal pain and found to have a high grade small bowel obstruction. An NG tube has been placed.  Has been evaluated by general sugery and yesterday mentioned "ileus vs bowel obstruction". There is consideration for repeat abdominal CT if not showing signs of improvement.  Has been tachycardic and hypertensive due to this. Currently IV hydralazine 5 mg q 4 hours and IV metoprolol 5 mg q 6 hours has been ordered.  NG tube removed earlier this morning by surgery.  Denies chest pain and shortness of breath.  Complains of inability to sleep and waxing and waning abdominal pain.  Denies having had flatus or a bowel movement.   Past Medical History:  Diagnosis Date  . Anemia   . Anxiety    takes Ativan daily  . Arthritis   . Bipolar disorder ([place])    takes Risperdal nightly  . Blood transfusion    . Cancer ([place])    In her gum  . Carpal tunnel syndrome of right wrist 05/23/2011  . Cervical disc disorder with radiculopathy of cervical region 10/31/2012  . Chronic back pain   . Chronic idiopathic constipation   . Colon polyps   . COPD (chronic obstructive pulmonary disease) with chronic bronchitis ([place]) 09/16/2013   Office Spirometry 10/30/2013-submaximal effort based on appearance of loop and curve. Numbers would fit with severe restriction but her physiologic capability may be better than this. FVC 0.91/44%, and 10.74/45%, FEV1/FVC 0.81, FEF 25-75% 1.43/69%    . Depression    takes Zoloft daily  . Diabetes mellitus    Type II  . Diverticulosis    TCS 9/08 by Dr. Delfin Edis for diarrhea . Bx for micro scopic colitis negative.   . Fibromyalgia   . Frequent falls   . GERD (gastroesophageal reflux disease)    takes Aciphex daily  . Glaucoma    eye drops daily  . Gum symptoms    infection on antibiotic  . Hemiplegia affecting non-dominant side, post-stroke 08/02/2011  . Hyperlipidemia    takes Crestor daily  . Hypertension    takes Amlodipine,Metoprolol,and Clonidine daily  . Hypothyroidism    takes Synthroid daily  . IBS (irritable bowel syndrome)   . Insomnia    takes Trazodone nightly  . Metabolic encephalopathy 3/84/6659  . Migraines    chronic headaches  . Mononeuritis lower limb   . Osteoporosis   .  Pancreatitis 2006   due to Depakote with normal EUS   . Schatzki's ring    non critical / EGD with ED 8/2011with RMR  . Seizures ([place])    takes Lamictal daily.Last seizure 3 yrs ago  . Sleep apnea    on CPAP  . Stroke [facility])    left sided weakness  . Tubular adenoma of colon     Past Surgical History:  Procedure Laterality Date  . ABDOMINAL HYSTERECTOMY  1978  . BACK SURGERY  July 2012  . BACTERIAL OVERGROWTH TEST N/A 05/05/2013   Procedure: BACTERIAL OVERGROWTH TEST;  Surgeon: Daneil Dolin, MD;  [facility];  Service: Endoscopy;  Laterality: N/A;   7:30  . BIOPSY THYROID  2009  . BRAIN SURGERY  11/2011   resection of meningioma  . BREAST REDUCTION SURGERY  1994  . CARDIAC CATHETERIZATION  05/10/2005   normal coronaries, normal LV systolic function and EF (Dr. Jackie Plum)  . CARPAL TUNNEL RELEASE Left 07/22/04   Dr. Aline Brochure  . CATARACT EXTRACTION Bilateral   . CHOLECYSTECTOMY  1984  . COLONOSCOPY N/A 09/25/2012   UMP:NTIRWER diverticulosis.  colonic polyp-removed : tubular adenoma  . CRANIOTOMY  11/23/2011   Procedure: CRANIOTOMY TUMOR EXCISION;  Surgeon: Hosie Spangle, MD;  Location: [facility];  Service: Neurosurgery;  Laterality: N/A;  Craniotomy for tumor resection  . ESOPHAGOGASTRODUODENOSCOPY  12/29/2010   Rourk-Retained food in the esophagus and stomach, small hiatal hernia, status post Maloney dilation of the esophagus  . ESOPHAGOGASTRODUODENOSCOPY N/A 09/25/2012   XVQ:[id number] atonic baggy esophagus status post Maloney dilation 69 F. Hiatal hernia  . GIVENS CAPSULE STUDY N/A 01/15/2013   NORMAL.   . IR GENERIC HISTORICAL  03/17/2016   IR RADIOLOGIST EVAL & MGMT 03/17/2016 [facility]-INTERV RAD  . LESION REMOVAL N/A 05/31/2015   Procedure: REMOVAL RIGHT AND LEFT LESIONS OF MANDIBLE;  Surgeon: Diona Browner, DDS;  Location: [facility];  Service: Oral Surgery;  Laterality: N/A;  . MALONEY DILATION  12/29/2010   RMR;  . NM MYOCAR PERF WALL MOTION  2006   "relavtiely normal" persantine, mild anterior thinning (breast attenuation artifact), no region of scar/ischemia  . OVARIAN CYST REMOVAL    . RECTOCELE REPAIR N/A 06/29/2015   Procedure: POSTERIOR REPAIR (RECTOCELE);  Surgeon: Jonnie Kind, MD;  Location: [facility];  Service: Gynecology;  Laterality: N/A;  . REDUCTION MAMMAPLASTY Bilateral   . SPINE SURGERY  09/29/2010   Dr. Rolena Infante  . surgical excision of 3 tumors from right thigh and right buttock  and left upper thigh  2010  . TOOTH EXTRACTION Bilateral 12/14/2014   Procedure: REMOVAL OF BILATERAL MANDIBULAR EXOSTOSES;  Surgeon: Diona Browner, DDS;  Location: [facility];  Service: Oral Surgery;  Laterality: Bilateral;  . TRANSTHORACIC ECHOCARDIOGRAM  2010   EF 60-65%, mild conc LVH, grade 1 diastolic dysfunction; mildly calcified MV annulus with mildly thickened leaflets, mildly calcified MR annulus     Inpatient Medications: Scheduled Meds: . Azelastine-Fluticasone  1 puff Nasal QHS  . bisacodyl  10 mg Rectal BID  . clobetasol ointment  1 application Topical Daily  . cycloSPORINE  1 drop Both Eyes BID  . enoxaparin (LOVENOX) injection  40 mg Subcutaneous Q24H  . haloperidol lactate  2 mg Intravenous QHS  . hydrALAZINE  5 mg Intravenous Q4H  . insulin aspart  0-15 Units Subcutaneous Q4H  . insulin glargine  10 Units Subcutaneous QHS  . ipratropium-albuterol  3 mL Nebulization TID  . levothyroxine  25 mcg Intravenous QAC breakfast  . LORazepam  0.5 mg Intravenous TID  . metoprolol tartrate  5 mg Intravenous Q6H  . pantoprazole (PROTONIX) IV  40 mg Intravenous Q24H   Continuous Infusions: . 0.9 % NaCl with KCl 20 mEq / L 50 mL/hr at 09/14/16 0415   PRN Meds: acetaminophen **OR** acetaminophen, albuterol, hydrALAZINE, LORazepam, metoprolol tartrate, morphine injection, ondansetron **OR** ondansetron (ZOFRAN) IV, phenol  Allergies:    Allergies  Allergen Reactions  . Cephalexin Hives  . Iron Nausea And Vomiting  . Milk-Related Compounds Other (See Comments)    Doesn't agree with stomach.   . Penicillins Hives    Has patient had a PCN reaction causing immediate rash, facial/tongue/throat swelling, SOB or lightheadedness with hypotension: Yes Has patient had a PCN reaction causing severe rash involving mucus membranes or skin necrosis: No Has patient had a PCN reaction that required hospitalization No Has patient had a PCN reaction occurring within the last 10 years: No If all of the above answers are "NO", then may proceed with Cephalosporin use.   [name] Phenazopyridine Hcl Hives          Social History:   Social  History   Social History  . Marital status: Divorced    Spouse name: N/A  . Number of children: 1  . Years of education: 63   Occupational History  . disabled     Social History Main Topics  . Smoking status: Light Tobacco Smoker    Packs/day: 0.25    Years: 7.00    Types: Cigarettes  . Smokeless tobacco: Never Used     Comment: continues to smoke 1/4 pack a day   . Alcohol use No     Comment:    . Drug use: No  . Sexual activity: Not on file   Other Topics Concern  . Not on file   Social History Narrative  . No narrative on file    Family History:   The patient's family history includes Alcohol abuse in her maternal uncle; Cancer in her sister and sister; Diabetes in her brother, brother, and father; Heart attack in her maternal grandfather and mother; Hypertension in her brother and son; Kidney failure in her father; Pancreatic cancer in her sister; Pneumonia in her father; Sleep apnea in her son; Stroke in her maternal grandmother. There is no history of Colon cancer, Anesthesia problems, Hypotension, Malignant hyperthermia, or Pseudochol deficiency.  ROS:  Please see the history of present illness.  ROS  All other ROS reviewed and negative.     Physical Exam/Data:   Vitals:   09/14/16 0357 09/14/16 0626 09/14/16 0741 09/14/16 0921  BP: (!) 208/83 (!) 190/69  (!) 191/88  Pulse: (!) 123 (!) 130  (!) 128  Resp:    18  Temp:      TempSrc:      SpO2: 98%  98%   Weight:      Height:        Intake/Output Summary (Last 24 hours) at 09/14/16 0938 Last data filed at 09/14/16 0322  Gross per 24 hour  Intake          1018.33 ml  Output             1200 ml  Net          -181.67 ml   Filed Weights   09/10/16 0423 09/10/16 1144  Weight: 157 lb (71.2 kg) 158 lb 1.6 oz (71.7 kg)   Body mass index is  31.93 kg/m.  General:  Well nourished, well developed, in no acute distress HEENT: normal Lymph: no adenopathy Neck: no JVD Endocrine:  No thryomegaly Cardiac:   Tachycardic, normal W4R1, soft systolic murmur Lungs:  clear to auscultation bilaterally, no wheezing, rhonchi or rales  Abd: Mildly distended and diffusely tender Ext: no edema Skin: warm and dry  Neuro:  no focal abnormalities noted Psych:  Normal affect   Telemetry:  Telemetry was personally reviewed and demonstrates:  Sinus tachycardia  Relevant CV Studies: Last echo reviewed above  Laboratory Data:  Chemistry Recent Labs Lab 09/12/16 0631 09/13/16 0547 09/14/16 0603  NA 138 143 139  K 3.8 3.8 3.6  CL 103 108 107  CO2 24 22 20*  GLUCOSE 161* 132* 164*  BUN 14 18 16   CREATININE 0.69 0.66 0.59  CALCIUM 9.0 9.2 9.1  GFRNONAA >60 >60 >60  GFRAA >60 >60 >60  ANIONGAP 11 13 12      Recent Labs Lab 09/10/16 0503  PROT 8.1  ALBUMIN 4.2  AST 29  ALT 28  ALKPHOS 127*  BILITOT 0.6   Hematology Recent Labs Lab 09/10/16 0503 09/11/16 0551 09/14/16 0603  WBC 8.4 10.2 13.3*  RBC 5.66* 4.79 5.58*  HGB 12.6 10.7* 12.5  HCT 41.5 35.6* 40.8  MCV 73.3* 74.3* 73.1*  MCH 22.3* 22.3* 22.4*  MCHC 30.4 30.1 30.6  RDW 14.9 14.5 14.5  PLT 272 249 277   Cardiac EnzymesNo results for input(s): TROPONINI in the last 168 hours. No results for input(s): TROPIPOC in the last 168 hours.  BNPNo results for input(s): BNP, PROBNP in the last 168 hours.  DDimer No results for input(s): DDIMER in the last 168 hours.  Radiology/Studies:  Dg Chest Port 1 View  Result Date: 09/10/2016 CLINICAL DATA:  Nasogastric placement. EXAM: PORTABLE CHEST 1 VIEW COMPARISON:  Earlier same day FINDINGS: Nasogastric tube enters the stomach. Tip is not visible but this extends at least to the mid body. Slightly more prominent atelectasis at the right lung base. IMPRESSION: Nasogastric tube enters the stomach with the tip at least at the mid body of the stomach. Slight worsening of right lower lobe atelectasis. Electronically Signed   By: Nelson Chimes M.D.   On: 09/10/2016 14:09    Assessment and Plan:     1. Accelerated hypertension: She has a baseline diagnosis of essential hypertension being aggravated by small bowel obstruction leading to abdominal pain as well as anxiety from inability to sleep. I have changed IV hydralazine to 10 mg q 6 hours and discontinued IV metoprolol in favor of IV labetalol which has both alpha and beta blockade activity.  2. Tachycardia: This is sinus tachycardia and is physiologic and the result of small bowel obstruction leading to abdominal pain as well as anxiety from inability to sleep. I have discontinued IV metoprolol in favor of IV labetalol which has both alpha and beta blockade activity. That being said, sinus tachycardia even with these rates is permissible and will resolve once the underlying issue (SBO) has resolved. Await general surgery input regarding repeat abdominal imaging.  3. SBO: NG tube removed. Has waxing and waning abdominal pain. Await general surgery input regarding repeat abdominal imaging.   Signed, Kate Sable, MD  09/14/2016 9:38 AM

## 2016-09-14 NOTE — Progress Notes (Signed)
Pt BP 208/83, HR 123. PRN hydralazine was given at 0243. MD aware, and ordered Hydralazine 10 mg IV once. Will continue to monitor.

## 2016-09-14 NOTE — Progress Notes (Signed)
  Subjective: Patient has had a bowel movement within the past 24 hours. She is passing flatus. She denies any abdominal pain.  Objective: Vital signs in last 24 hours: Temp:  [97.6 F (36.4 C)-99.5 F (37.5 C)] 98.4 F (36.9 C) (07/04 2036) Pulse Rate:  [97-136] 128 (07/05 0921) Resp:  [18] 18 (07/05 0921) BP: (166-211)/(64-137) 191/88 (07/05 0921) SpO2:  [96 %-100 %] 98 % (07/05 0741)    Intake/Output from previous day: 07/04 0701 - 07/05 0700 In: 1018.3 [I.V.:1018.3] Out: 1200 [Urine:1200] Intake/Output this shift: No intake/output data recorded.  General appearance: alert, cooperative and no distress GI: soft, non-tender; bowel sounds normal; no masses,  no organomegaly  Lab Results:   Recent Labs  09/14/16 0603  WBC 13.3*  HGB 12.5  HCT 40.8  PLT 277   BMET  Recent Labs  09/13/16 0547 09/14/16 0603  NA 143 139  K 3.8 3.6  CL 108 107  CO2 22 20*  GLUCOSE 132* 164*  BUN 18 16  CREATININE 0.66 0.59  CALCIUM 9.2 9.1   PT/INR No results for input(s): LABPROT, INR in the last 72 hours.  Studies/Results: No results found.  Anti-infectives: Anti-infectives    Start     Dose/Rate Route Frequency Ordered Stop   09/10/16 2200  ciprofloxacin (CIPRO) IVPB 400 mg  Status:  Discontinued     400 mg 200 mL/hr over 60 Minutes Intravenous Every 12 hours 09/10/16 1213 09/11/16 1227   09/10/16 1800  metroNIDAZOLE (FLAGYL) IVPB 500 mg  Status:  Discontinued     500 mg 100 mL/hr over 60 Minutes Intravenous Every 8 hours 09/10/16 1213 09/11/16 1227   09/10/16 0845  ciprofloxacin (CIPRO) IVPB 400 mg     400 mg 200 mL/hr over 60 Minutes Intravenous  Once 09/10/16 0831 09/10/16 1015   09/10/16 0845  metroNIDAZOLE (FLAGYL) IVPB 500 mg     500 mg 100 mL/hr over 60 Minutes Intravenous  Once 09/10/16 0831 09/10/16 1130      Assessment/Plan: Impression: Small bowel obstruction, resolving. Currently having hypertensive issues. Plan: DC gastric tube. Start clear  liquid diet.  LOS: 4 days    Aviva Signs 09/14/2016

## 2016-09-15 ENCOUNTER — Ambulatory Visit: Payer: Medicare HMO | Admitting: Podiatry

## 2016-09-15 ENCOUNTER — Inpatient Hospital Stay (HOSPITAL_COMMUNITY): Payer: Medicare HMO

## 2016-09-15 DIAGNOSIS — E785 Hyperlipidemia, unspecified: Secondary | ICD-10-CM

## 2016-09-15 DIAGNOSIS — K56609 Unspecified intestinal obstruction, unspecified as to partial versus complete obstruction: Secondary | ICD-10-CM

## 2016-09-15 DIAGNOSIS — F319 Bipolar disorder, unspecified: Secondary | ICD-10-CM

## 2016-09-15 DIAGNOSIS — E039 Hypothyroidism, unspecified: Secondary | ICD-10-CM

## 2016-09-15 LAB — GLUCOSE, CAPILLARY
GLUCOSE-CAPILLARY: 148 mg/dL — AB (ref 65–99)
GLUCOSE-CAPILLARY: 158 mg/dL — AB (ref 65–99)
GLUCOSE-CAPILLARY: 159 mg/dL — AB (ref 65–99)
Glucose-Capillary: 134 mg/dL — ABNORMAL HIGH (ref 65–99)
Glucose-Capillary: 142 mg/dL — ABNORMAL HIGH (ref 65–99)
Glucose-Capillary: 149 mg/dL — ABNORMAL HIGH (ref 65–99)
Glucose-Capillary: 178 mg/dL — ABNORMAL HIGH (ref 65–99)

## 2016-09-15 LAB — BASIC METABOLIC PANEL
ANION GAP: 9 (ref 5–15)
BUN: 18 mg/dL (ref 6–20)
CALCIUM: 9.1 mg/dL (ref 8.9–10.3)
CO2: 24 mmol/L (ref 22–32)
Chloride: 105 mmol/L (ref 101–111)
Creatinine, Ser: 0.75 mg/dL (ref 0.44–1.00)
GFR calc Af Amer: >60 mL/min (ref >60)
GFR calc non Af Amer: >60 mL/min (ref >60)
GLUCOSE: 163 mg/dL — AB (ref 65–99)
Potassium: 3.3 mmol/L — ABNORMAL LOW (ref 3.5–5.1)
Sodium: 138 mmol/L (ref 135–145)

## 2016-09-15 LAB — CBC
HEMATOCRIT: 40.2 % (ref 36.0–46.0)
Hemoglobin: 12.2 g/dL (ref 12.0–15.0)
MCH: 22.1 pg — ABNORMAL LOW (ref 26.0–34.0)
MCHC: 30.3 g/dL (ref 30.0–36.0)
MCV: 72.8 fL — AB (ref 78.0–100.0)
Platelets: 271 10*3/uL (ref 150–400)
RBC: 5.52 MIL/uL — ABNORMAL HIGH (ref 3.87–5.11)
RDW: 14.6 % (ref 11.5–15.5)
WBC: 9.8 10*3/uL (ref 4.0–10.5)

## 2016-09-15 IMAGING — CR DG CHEST 1V PORT
1 series · 1 of 1 positions shown · non-contrast
Comparison: 12/26/2014

CLINICAL DATA: Chest pain

EXAM:
PORTABLE CHEST - 1 VIEW

[ap portable]
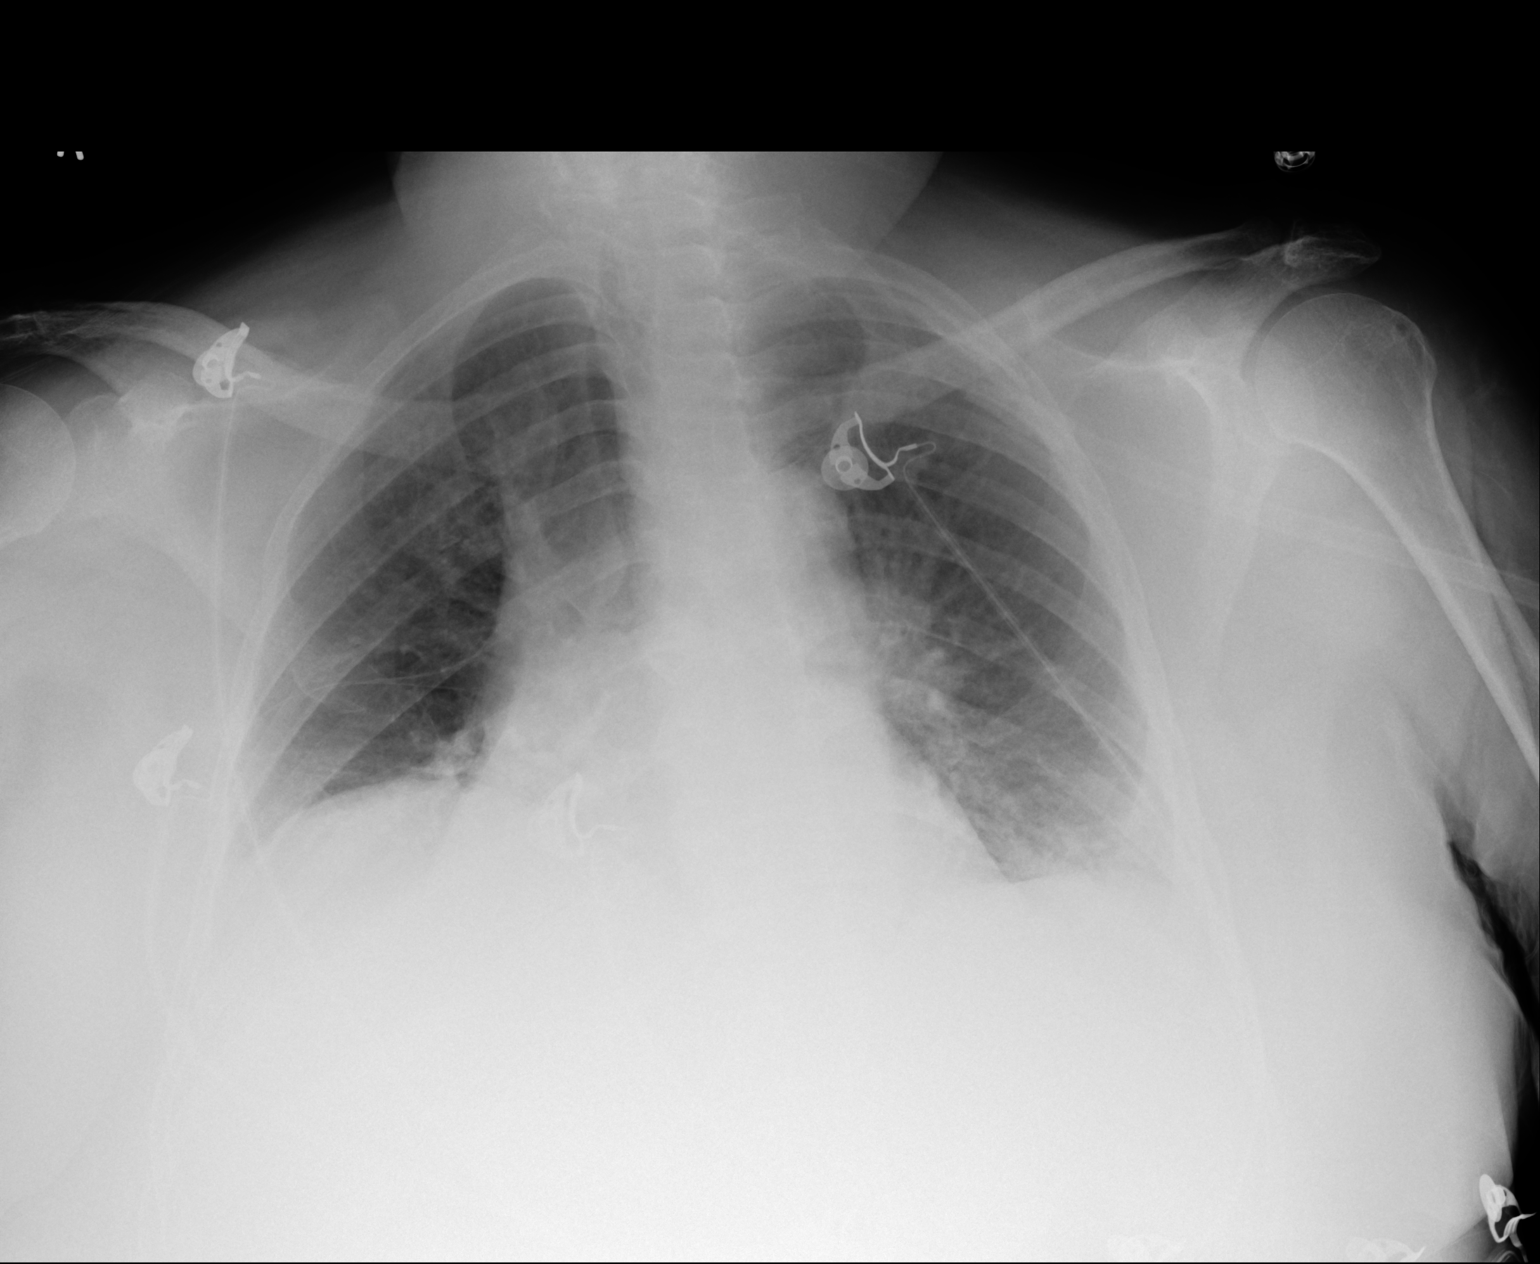

[1 of 1 positions shown; findings below may reference images not displayed]

FINDINGS: Cardiac shadow is mildly enlarged. Bibasilar atelectatic changes are
seen. No focal confluent infiltrate is noted. No acute bony
abnormality is seen.
IMPRESSION: Minimal bibasilar atelectasis.

## 2016-09-15 IMAGING — CT CT HEAD W/O CM
1 of 2 series · 13 of 30 positions shown, 17 images · non-contrast
Comparison: 02/01/2015.

CLINICAL DATA: Left-sided weakness

EXAM:
CT HEAD WITHOUT CONTRAST
TECHNIQUE: Contiguous axial images were obtained from the base of the skull
through the vertex without intravenous contrast.

[Series 2: headseq 4.8 h37s · axial · 0.48mm/px · z∈[+125,+245]mm · 13 of 30 slices shown, 17 images]
[im 3/30  brain]
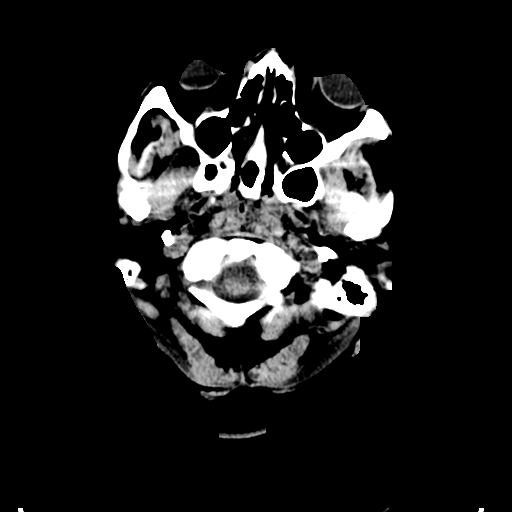
[im 3/30  bone]
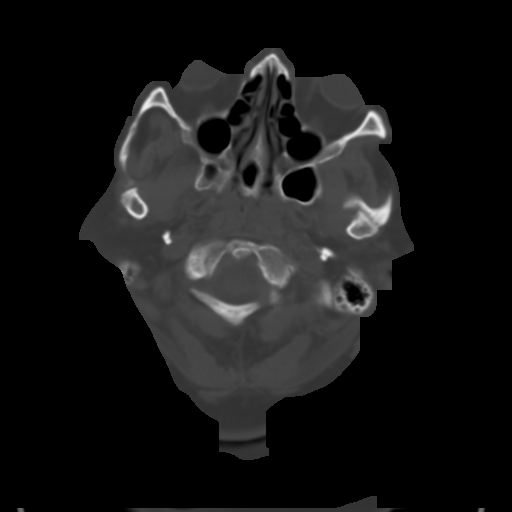
[im 5/30  brain]
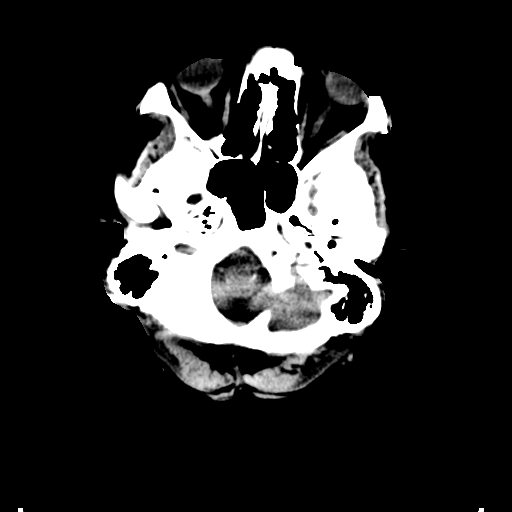
[im 7/30  brain]
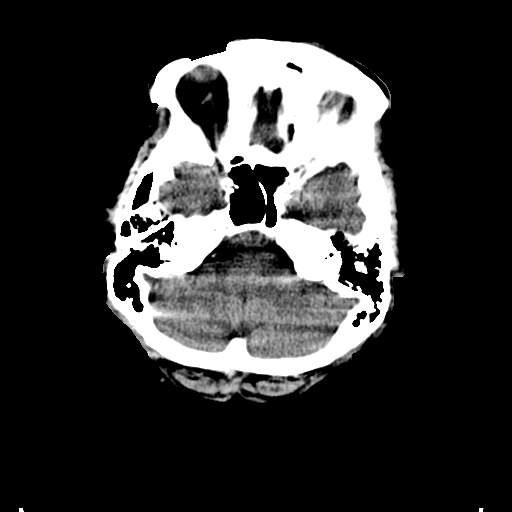
[im 9/30  brain]
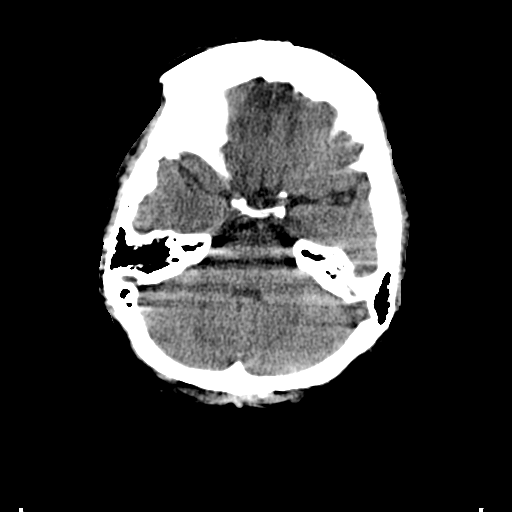
[im 11/30  brain]
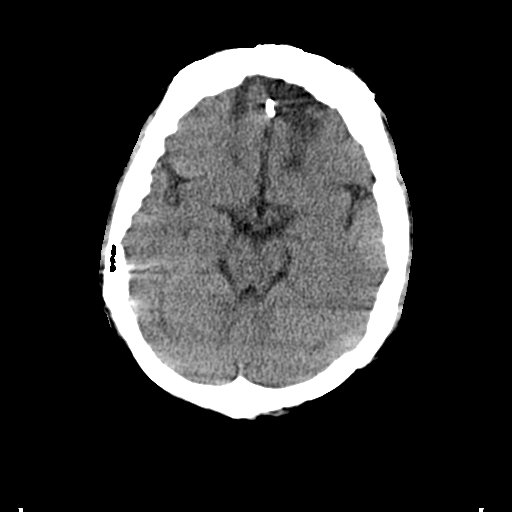
[im 11/30  bone]
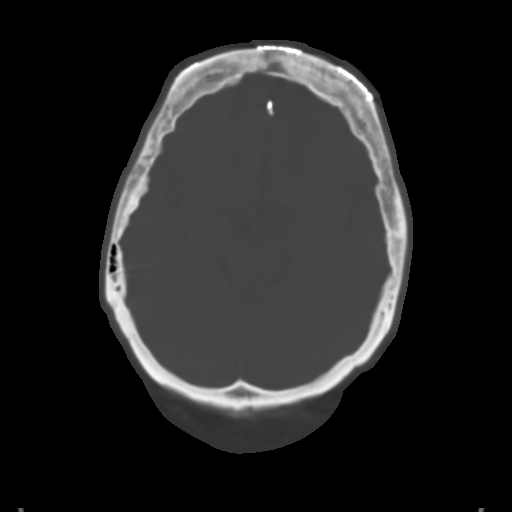
[im 13/30  brain]
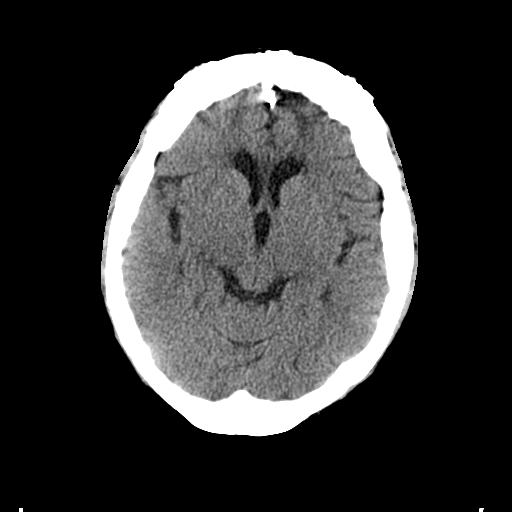
[im 15/30  brain]
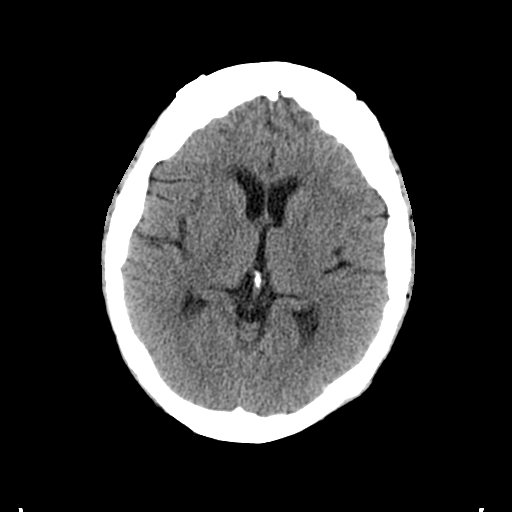
[im 17/30  brain]
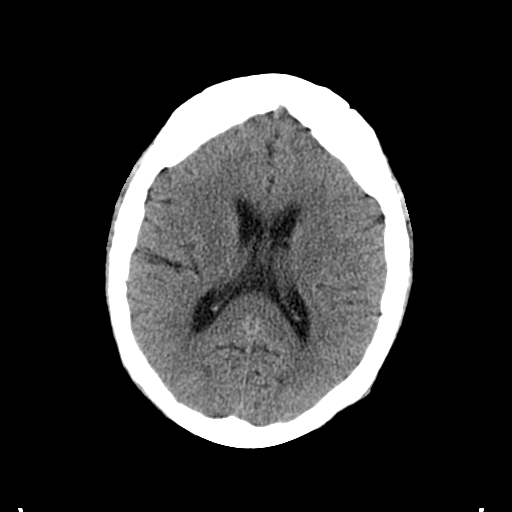
[im 19/30  brain]
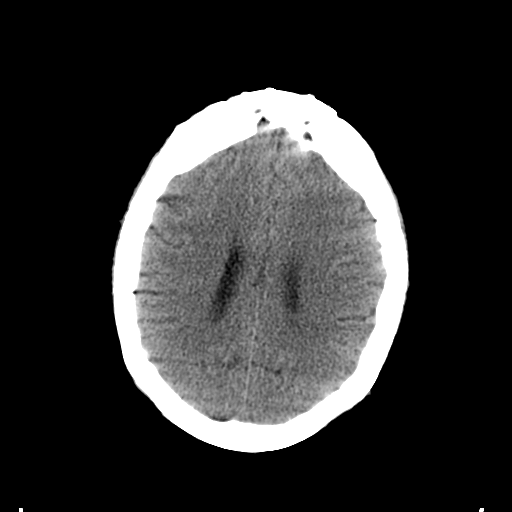
[im 19/30  bone]
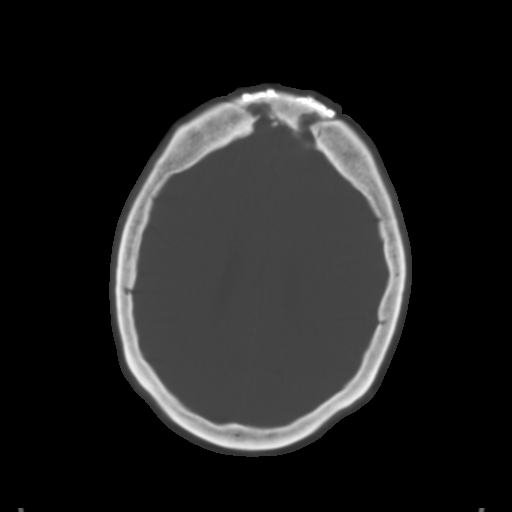
[im 21/30  brain]
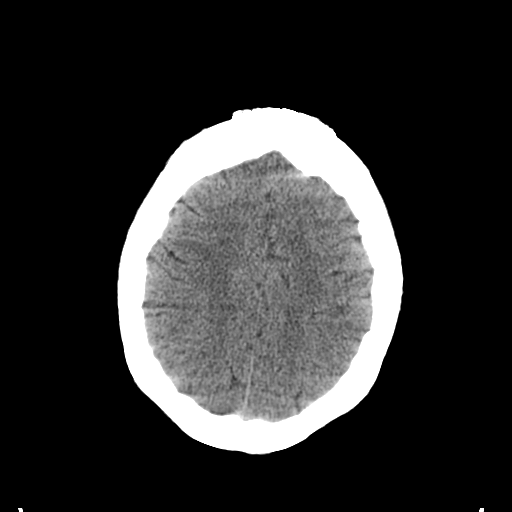
[im 23/30  brain]
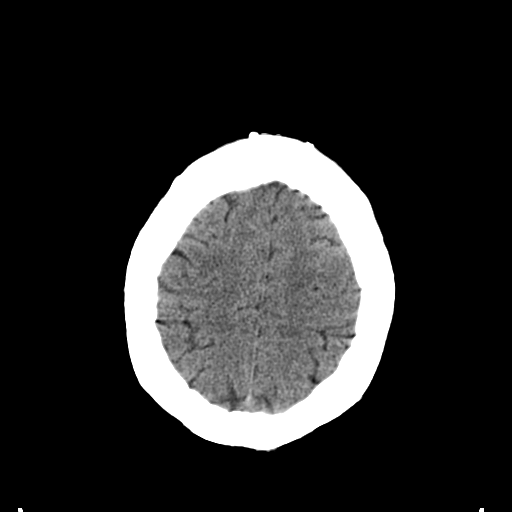
[im 25/30  brain]
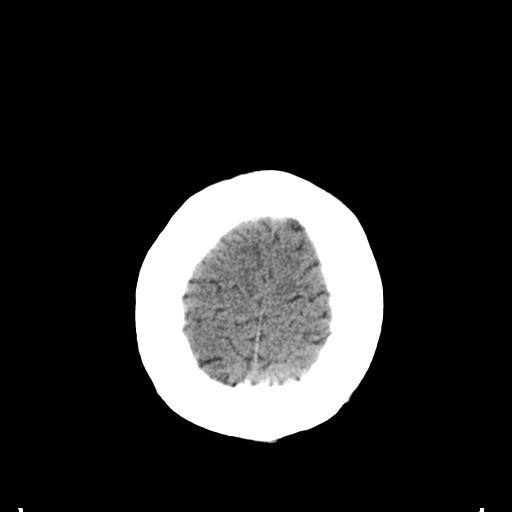
[im 27/30  brain]
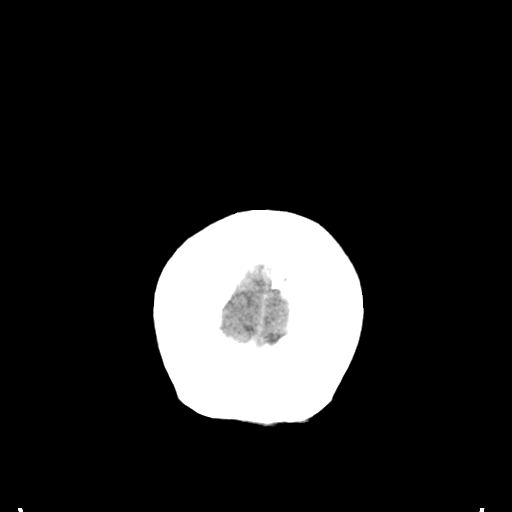
[im 27/30  bone]
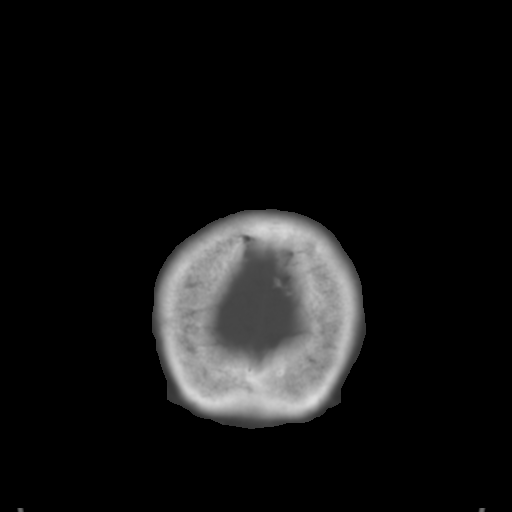

[13 of 30 positions shown; findings below may reference images not displayed]

FINDINGS: There are changes consistent with frontal craniotomy consistent with
the given clinical history of meningioma resection. Encephalomalacia
changes are noted in the frontal lobes bilaterally consistent with
the prior surgery. These are stable from the previous exam. No
findings to suggest acute hemorrhage, acute infarction or
space-occupying mass lesion are noted.
IMPRESSION: Chronic changes in the frontal lobes consistent with a prior
surgical resection. No acute abnormality is noted.

These results were called by telephone at the time of interpretation
on 02/21/2015 at [DATE] to Dr. LA CARRICO , who verbally
acknowledged these results.

## 2016-09-15 MED ORDER — IOPAMIDOL (ISOVUE-300) INJECTION 61%
INTRAVENOUS | Status: AC
  Administered 2016-09-15: 30 mL
  Filled 2016-09-15: qty 30

## 2016-09-15 MED ORDER — IOPAMIDOL (ISOVUE-300) INJECTION 61%
100.0000 mL | Freq: Once | INTRAVENOUS | Status: AC | PRN
  Administered 2016-09-15: 100 mL via INTRAVENOUS

## 2016-09-15 NOTE — Progress Notes (Signed)
PROGRESS NOTE    [name]  UEA:[id number] DOB: 01-15-1951 DOA: 09/10/2016 PCP: [name], MD    Brief Narrative: Patient is 66 year old female admitted to the hospital with several days duration of progressively worsening abdominal pain associated with nausea and vomiting, poor oral intake. Found to have small bowel obstruction on CT scan and admitted for supportive management.  Surgery has been following.  Repeated CT showed some improvement, but SBO persisted.    Assessment & Plan:   Principal Problem:   SBO (small bowel obstruction) (HCC) Active Problems:   Hyperlipidemia   GERD   Obstructive sleep apnea   Bipolar disorder (HCC)   Hypothyroidism   COPD (chronic obstructive pulmonary disease) with chronic bronchitis ([place])   Poorly controlled type 2 diabetes mellitus with circulatory disorder (HCC)   Labile hypertension  Small bowel obstruction :  Continue with clear liquid.  Advance when better.  Continue with IVF.    Leukocytosis :  Unclear etiology.  Resolved.   COPD, obstructive sleep apnea - stable respiratory status   Hypertension, accelerated, Tachycardia  - cardiology consulted for assistance - likely exacerbated by acute illness and not taking O blood pressure meds -Continue with IV PRN Hydralazine and Labetelol.   Diabetes mellitus type 2 with long-term use of insulin - stable for now - at home on Insulin Glargine 25 U QHS, currently getting 10 U QHS, will plan to readjust the dose as clinically indicated   Hypothyroidism - Continue IV Synthroid and plan to change to PO once we confirm pt tolerating PO off NGT   Bipolar disorder - On risperidone at home, resume possibly today or tomorrow   DVT prophylaxis: Lovenox.  Code Status: FULL CODE.  Family Communication: None  Disposition Plan:  Home.   Consultants:   Cardiology  Surgery.   Procedures:   None.   Antimicrobials: Anti-infectives    Start     Dose/Rate Route Frequency  Ordered Stop   09/10/16 2200  ciprofloxacin (CIPRO) IVPB 400 mg  Status:  Discontinued     400 mg 200 mL/hr over 60 Minutes Intravenous Every 12 hours 09/10/16 1213 09/11/16 1227   09/10/16 1800  metroNIDAZOLE (FLAGYL) IVPB 500 mg  Status:  Discontinued     500 mg 100 mL/hr over 60 Minutes Intravenous Every 8 hours 09/10/16 1213 09/11/16 1227   09/10/16 0845  ciprofloxacin (CIPRO) IVPB 400 mg     400 mg 200 mL/hr over 60 Minutes Intravenous  Once 09/10/16 0831 09/10/16 1015   09/10/16 0845  metroNIDAZOLE (FLAGYL) IVPB 500 mg     500 mg 100 mL/hr over 60 Minutes Intravenous  Once 09/10/16 0831 09/10/16 1130       Subjective:   No complaints.  Wants med for nausea.   Objective: Vitals:   09/15/16 0318 09/15/16 0543 09/15/16 1355 09/15/16 1421  BP: (!) 149/61 (!) 119/56 (!) 158/70   Pulse: (!) 126 (!) 120 (!) 105   Resp: 18  18   Temp: 99.2 F (37.3 C)  98.6 F (37 C)   TempSrc: Oral  Oral   SpO2: 96%  99% 96%  Weight:      Height:        Intake/Output Summary (Last 24 hours) at 09/15/16 1703 Last data filed at 09/15/16 1400  Gross per 24 hour  Intake          1440.67 ml  Output              200 ml  Net          1240.67 ml   Filed Weights   09/10/16 0423 09/10/16 1144  Weight: 71.2 kg (157 lb) 71.7 kg (158 lb 1.6 oz)    Examination:  General exam: Appears calm and comfortable  Respiratory system: Clear to auscultation. Respiratory effort normal. Cardiovascular system: S1 & S2 heard, RRR. No JVD, murmurs, rubs, gallops or clicks. No pedal edema. Gastrointestinal system: Abdomen is nondistended, soft and nontender. No organomegaly or masses felt. Normal bowel sounds heard. Central nervous system: Alert and oriented. No focal neurological deficits. Extremities: Symmetric 5 x 5 power. Skin: No rashes, lesions or ulcers Psychiatry: Judgement and insight appear normal. Mood & affect appropriate.   Data Reviewed: I have personally reviewed following labs and imaging  studies  CBC:  Recent Labs Lab 09/10/16 0503 09/11/16 0551 09/14/16 0603 09/15/16 0720  WBC 8.4 10.2 13.3* 9.8  NEUTROABS 6.9  --   --   --   HGB 12.6 10.7* 12.5 12.2  HCT 41.5 35.6* 40.8 40.2  MCV 73.3* 74.3* 73.1* 72.8*  PLT 272 249 277 643   Basic Metabolic Panel:  Recent Labs Lab 09/11/16 0551 09/12/16 0631 09/13/16 0547 09/14/16 0603 09/15/16 0720  NA 140 138 143 139 138  K 3.2* 3.8 3.8 3.6 3.3*  CL 107 103 108 107 105  CO2 24 24 22  20* 24  GLUCOSE 137* 161* 132* 164* 163*  BUN 24* 14 18 16 18   CREATININE 0.85 0.69 0.66 0.59 0.75  CALCIUM 8.5* 9.0 9.2 9.1 9.1   GFR: Estimated Creatinine Clearance: 59.6 mL/min (by C-G formula based on SCr of 0.75 mg/dL). Liver Function Tests:  Recent Labs Lab 09/10/16 0503  AST 29  ALT 28  ALKPHOS 127*  BILITOT 0.6  PROT 8.1  ALBUMIN 4.2    Recent Labs Lab 09/10/16 0503  LIPASE 23   CBG:  Recent Labs Lab 09/15/16 0023 09/15/16 0323 09/15/16 0746 09/15/16 1143 09/15/16 1627  GLUCAP 149* 142* 159* 178* 134*   Radiology Studies: Ct Abdomen Pelvis W Contrast  Result Date: 09/15/2016 CLINICAL DATA:  Followup small bowel obstruction. EXAM: CT ABDOMEN AND PELVIS WITH CONTRAST TECHNIQUE: Multidetector CT imaging of the abdomen and pelvis was performed using the standard protocol following bolus administration of intravenous contrast. CONTRAST:  160mL ISOVUE-300 IOPAMIDOL (ISOVUE-300) INJECTION 61%, <See Chart> ISOVUE-300 IOPAMIDOL (ISOVUE-300) INJECTION 61% COMPARISON:  09/10/2016 FINDINGS: Lower chest: The lung bases are clear. No pleural effusion or pulmonary lesions. The heart is normal in size. No pericardial effusion. Hepatobiliary: No focal hepatic lesions. Status post cholecystectomy. Mild stable biliary dilatation. Pancreas: No mass, inflammation or ductal dilatation. Spleen: Normal size.  No focal lesions. Adrenals/Urinary Tract: The adrenal glands and kidneys are normal. The bladder is normal. Stomach/Bowel:  The stomach is unremarkable. The duodenum appears normal. Persistent mild proximal small bowel dilatation with air fluid and air contrast levels. There is a transition to normal/ decompressed small bowel loops and terminal ileum. There is moderate fluid, stool and air scattered in the colon. Findings consistent with a persistent but slightly improved partial small bowel obstruction. This is most likely due to adhesions in the left abdomen. The appendix is normal. Vascular/Lymphatic: Stable moderate atherosclerotic calcifications involving the aorta and iliac arteries. The major venous structures are patent. No mesenteric or retroperitoneal mass or adenopathy. Reproductive: Surgically absent. Other: No pelvic mass or adenopathy. No free pelvic fluid collections. No inguinal mass or adenopathy. No abdominal wall hernia or subcutaneous lesions. Musculoskeletal: No significant bony  findings. Remote surgical changes in the lower lumbar spine with decompressive laminectomies at L4-5 and L5-S1. IMPRESSION: Persistent but slightly improved partial small bowel obstruction. Electronically Signed   By: Marijo Sanes M.D.   On: 09/15/2016 14:48    Scheduled Meds: . Azelastine-Fluticasone  1 puff Nasal QHS  . bisacodyl  10 mg Rectal BID  . clobetasol ointment  1 application Topical Daily  . cycloSPORINE  1 drop Both Eyes BID  . enoxaparin (LOVENOX) injection  40 mg Subcutaneous Q24H  . haloperidol lactate  2 mg Intravenous QHS  . hydrALAZINE  10 mg Intravenous Q6H  . insulin aspart  0-15 Units Subcutaneous Q4H  . insulin glargine  10 Units Subcutaneous QHS  . labetalol  10 mg Intravenous Q6H  . levalbuterol  1.25 mg Nebulization TID  . levothyroxine  25 mcg Intravenous QAC breakfast  . pantoprazole  40 mg Oral Q1200  . traZODone  150 mg Oral QHS   Continuous Infusions: . 0.9 % NaCl with KCl 20 mEq / L 20 mL/hr at 09/14/16 1218     LOS: 5 days   [name] Emanuele, MD [facility].   If 7PM-7AM, please  contact night-coverage [email or web address] Password [email or web address] 09/15/2016, 5:03 PM

## 2016-09-15 NOTE — Progress Notes (Signed)
Patient has refused to use a CPAP unit again this evening. I went over the  Benefits of her using a CPAP and asked if she wanted to try and she refused again. She says her stomach is still not right and she wanted to sleep comfortably this evening. Patient is on room air with a nasal cannula on standby for use later. Nursing staff made aware of situation.

## 2016-09-15 NOTE — Care Management Important Message (Signed)
Important Message  Patient Details  Name: [name] MRN: [id number] Date of Birth: 1950/08/23   Medicare Important Message Given:  Yes    Sherald Barge, RN 09/15/2016, 2:43 PM

## 2016-09-15 NOTE — Progress Notes (Signed)
  Subjective: Patient states she has tried to have a bowel movement. She does pass some flatus. No change in her abdominal discomfort.  Objective: Vital signs in last 24 hours: Temp:  [98.5 F (36.9 C)-99.3 F (37.4 C)] 99.2 F (37.3 C) (07/06 0318) Pulse Rate:  [109-140] 120 (07/06 0543) Resp:  [18-20] 18 (07/06 0318) BP: (119-199)/(56-97) 119/56 (07/06 0543) SpO2:  [95 %-100 %] 96 % (07/06 0318)    Intake/Output from previous day: 07/05 0701 - 07/06 0700 In: 480 [P.O.:480] Out: 300 [Urine:300] Intake/Output this shift: No intake/output data recorded.  General appearance: alert, cooperative and no distress GI: Soft, nontender. Bowel sounds are present.  Lab Results:   Recent Labs  09/14/16 0603 09/15/16 0720  WBC 13.3* 9.8  HGB 12.5 12.2  HCT 40.8 40.2  PLT 277 271   BMET  Recent Labs  09/14/16 0603 09/15/16 0720  NA 139 138  K 3.6 3.3*  CL 107 105  CO2 20* 24  GLUCOSE 164* 163*  BUN 16 18  CREATININE 0.59 0.75  CALCIUM 9.1 9.1   PT/INR No results for input(s): LABPROT, INR in the last 72 hours.  Studies/Results: No results found.  Anti-infectives: Anti-infectives    Start     Dose/Rate Route Frequency Ordered Stop   09/10/16 2200  ciprofloxacin (CIPRO) IVPB 400 mg  Status:  Discontinued     400 mg 200 mL/hr over 60 Minutes Intravenous Every 12 hours 09/10/16 1213 09/11/16 1227   09/10/16 1800  metroNIDAZOLE (FLAGYL) IVPB 500 mg  Status:  Discontinued     500 mg 100 mL/hr over 60 Minutes Intravenous Every 8 hours 09/10/16 1213 09/11/16 1227   09/10/16 0845  ciprofloxacin (CIPRO) IVPB 400 mg     400 mg 200 mL/hr over 60 Minutes Intravenous  Once 09/10/16 0831 09/10/16 1015   09/10/16 0845  metroNIDAZOLE (FLAGYL) IVPB 500 mg     500 mg 100 mL/hr over 60 Minutes Intravenous  Once 09/10/16 0831 09/10/16 1130      Assessment/Plan: Impression: Small bowel obstruction versus ileus. Plan: Will repeat CT scan of abdomen with oral contrast today.  Further management pending those results.  LOS: 5 days    Aviva Signs 09/15/2016

## 2016-09-16 DIAGNOSIS — K219 Gastro-esophageal reflux disease without esophagitis: Secondary | ICD-10-CM

## 2016-09-16 DIAGNOSIS — R109 Unspecified abdominal pain: Secondary | ICD-10-CM

## 2016-09-16 LAB — GLUCOSE, CAPILLARY
GLUCOSE-CAPILLARY: 175 mg/dL — AB (ref 65–99)
Glucose-Capillary: 100 mg/dL — ABNORMAL HIGH (ref 65–99)
Glucose-Capillary: 113 mg/dL — ABNORMAL HIGH (ref 65–99)

## 2016-09-16 IMAGING — MR MR CERVICAL SPINE W/O CM
4 of 5 series · 19 of 48 positions shown · IV contrast (multihance)
Comparison: Head CT 02/21/2015.  MRI 02/01/2015.  MRI 07/13/2014.

CLINICAL DATA: Left-sided weakness and difficulty walking, 2 days
duration. Previous meningioma resection.

EXAM:
MRI HEAD WITH CONTRAST
MRI CERVICAL SPINE WITHout CONTRAST
TECHNIQUE: Multiplanar, multiecho pulse sequences of the brain and surrounding
structures, and cervical spine, to include the craniocervical
junction and cervicothoracic junction, were obtained with
intravenous contrast. Cervical study was done without contrast.
CONTRAST:  7mL MULTIHANCE GADOBENATE DIMEGLUMINE 529 MG/ML IV SOLN

[Series 4: T2 · sagittal · 3.0mm · 0.28mm/px · 8 of 15 slices shown (1 of 2)]
[im 1/15]
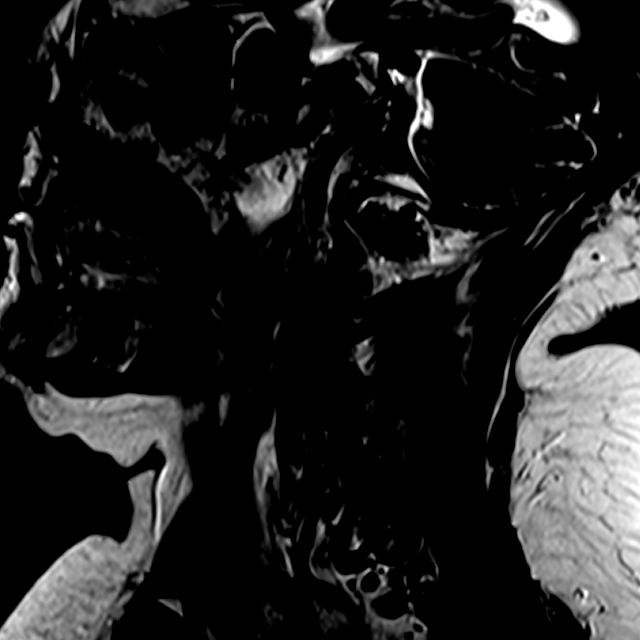
[im 3/15]
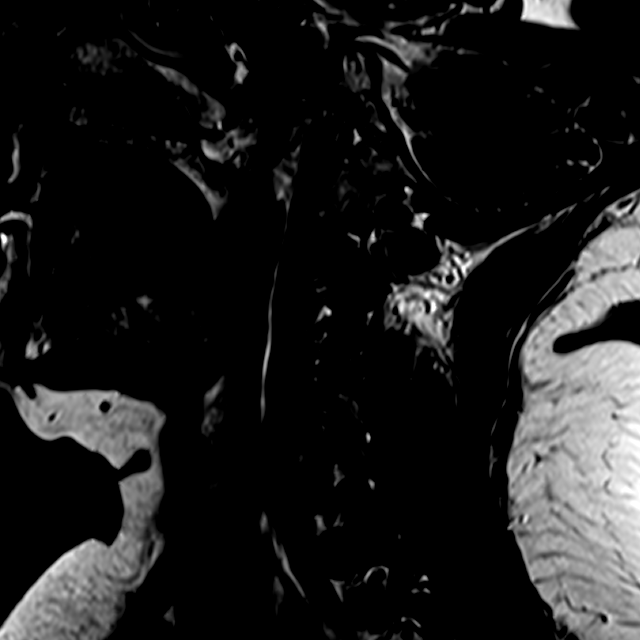
[im 5/15]
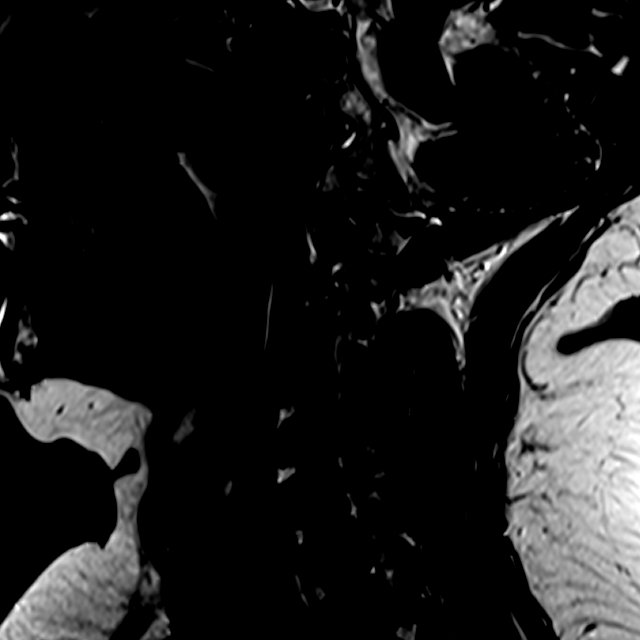
[im 7/15]
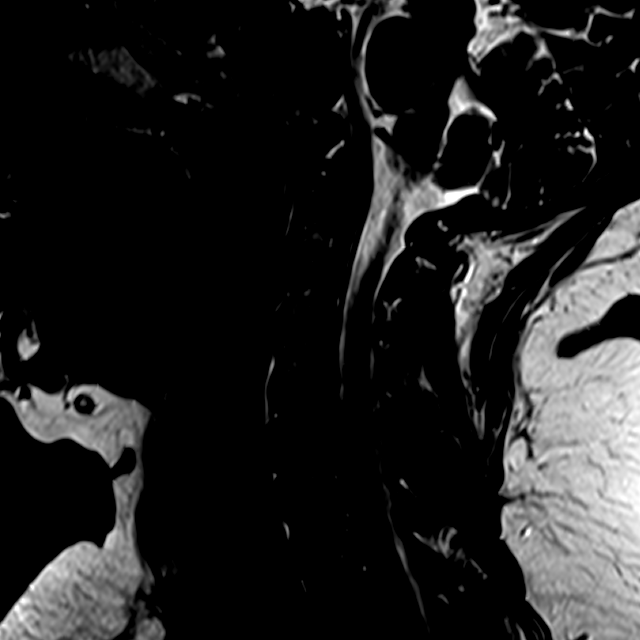
[im 9/15]
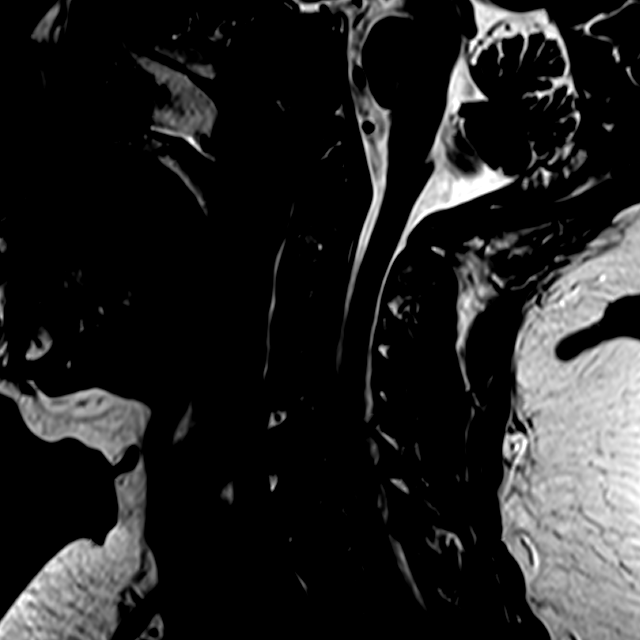
[im 11/15]
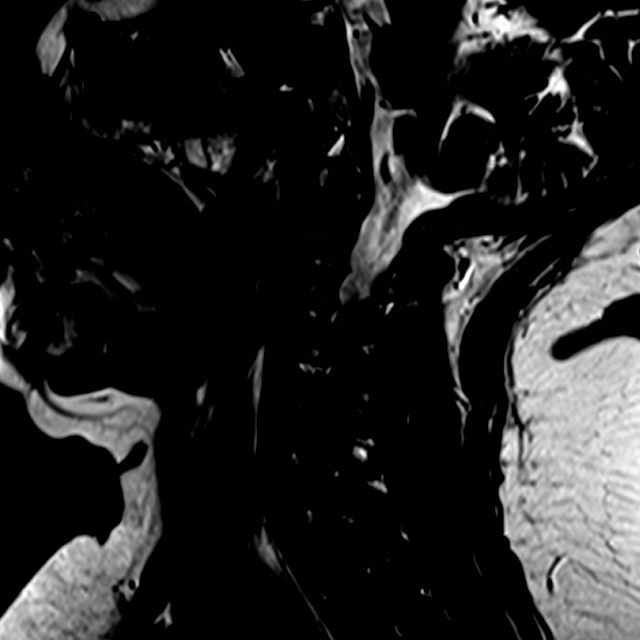
[im 13/15]
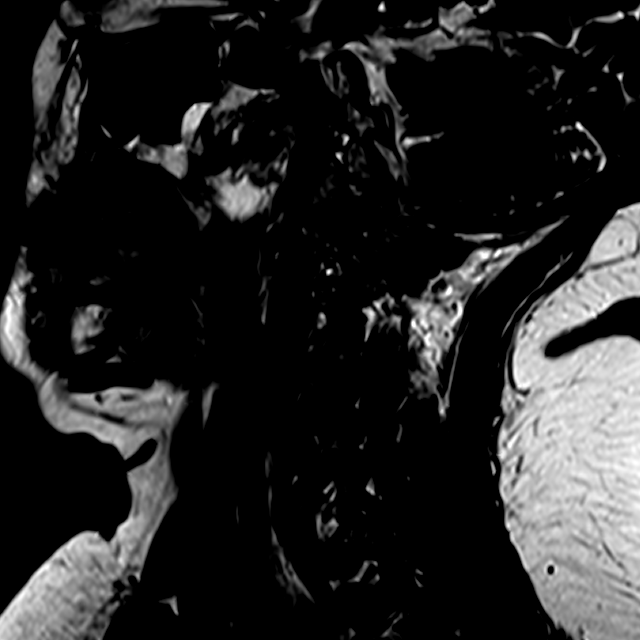
[im 15/15]
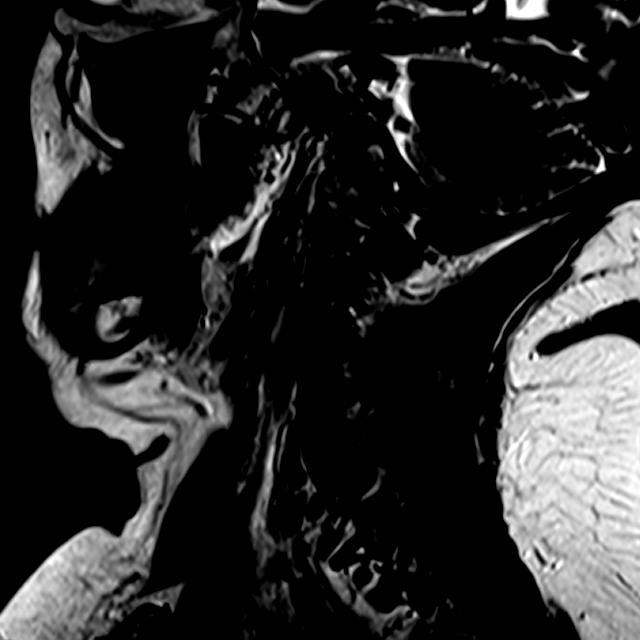

[Series 5: ir sagital · sagittal · 3.0mm · 0.30mm/px · 3 of 15 slices shown]
[im 3/15]
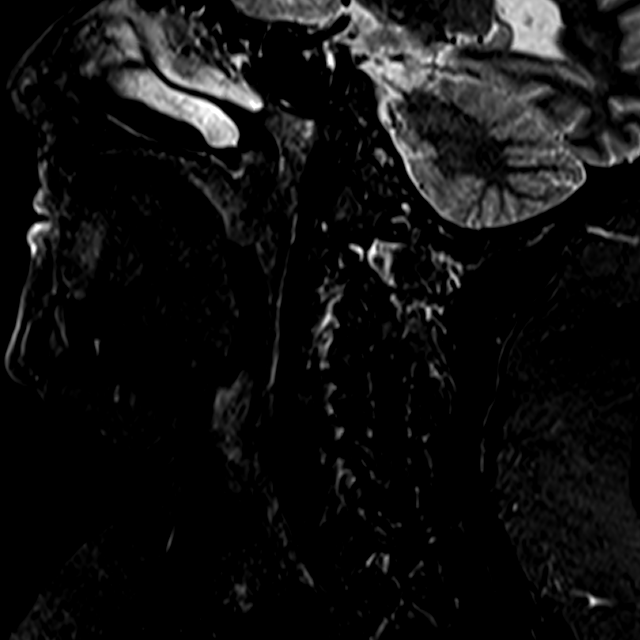
[im 8/15]
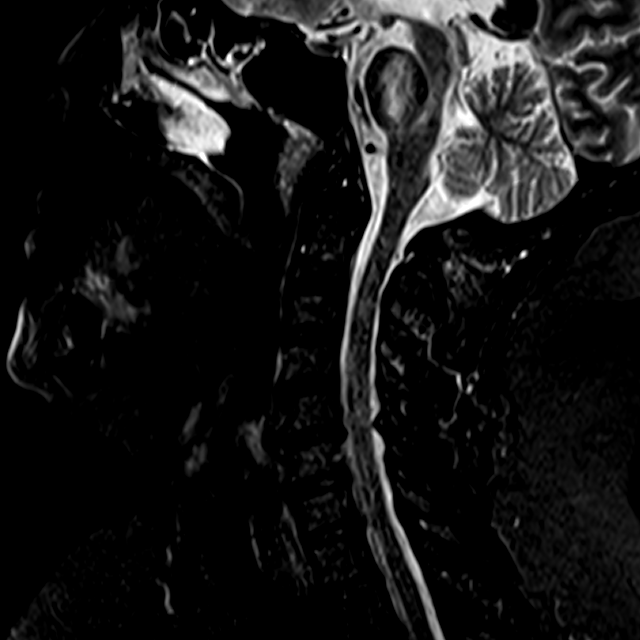
[im 12/15]
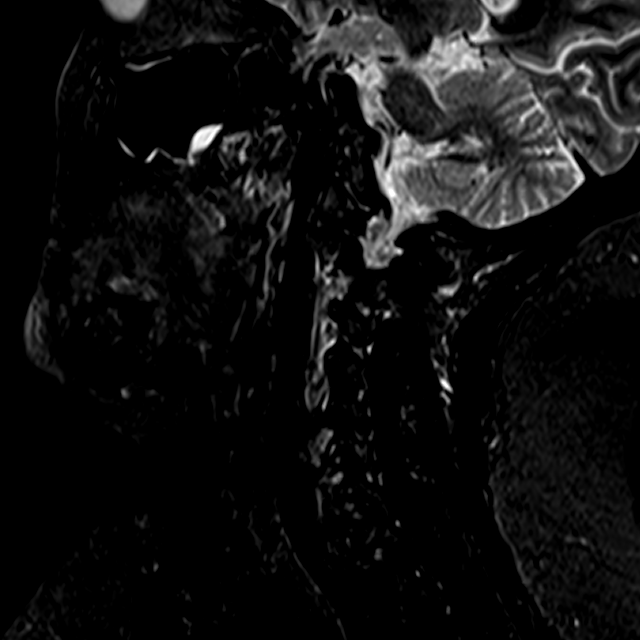

[Series 6: (id) sagital repeat · sagittal · 3.0mm · 0.53mm/px · 3 of 15 slices shown]
[im 3/15]
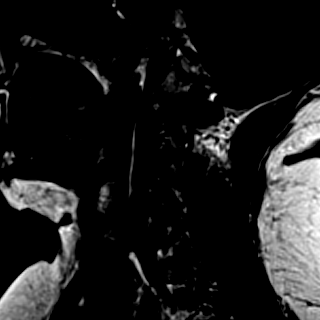
[im 8/15]
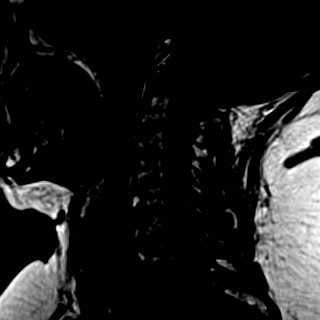
[im 12/15]
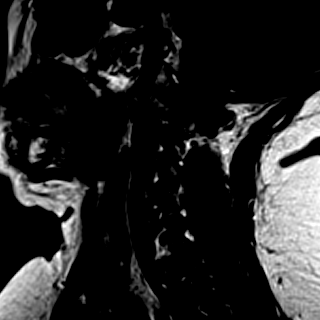

[Series 8: T2 · axial · 3.0mm · 0.43mm/px · z∈[-94,-23]mm · 5 of 28 slices shown (2 of 2)]
[im 1/28]
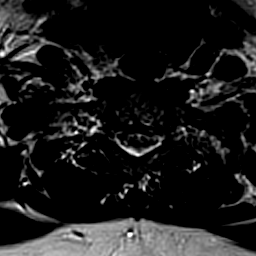
[im 5/28]
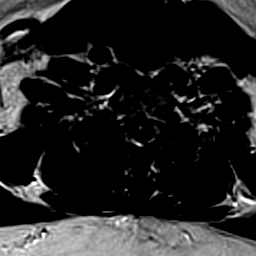
[im 10/28]
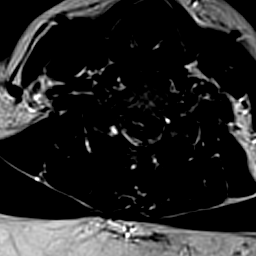
[im 14/28]
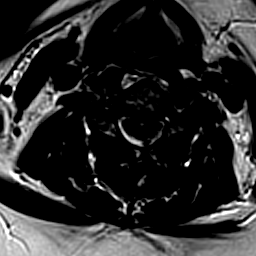
[im 23/28]
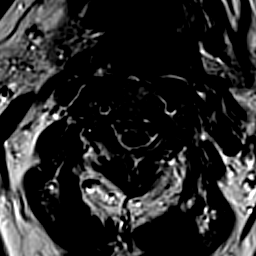

[19 of 48 positions shown; findings below may reference images not displayed]

FINDINGS: MRI HEAD FINDINGS

Diffusion imaging does not show any acute or subacute infarction.
There are extensive chronic small vessel ischemic changes throughout
the pons. No focal cerebellar insult. The cerebral hemispheres show
atrophy, encephalomalacia and gliosis presumably secondary to
previous meningioma and subsequent resection. No evidence of
residual or recurrent meningioma in that region. No intra-axial mass
lesion, acute hemorrhage, hydrocephalus or extra-axial fluid
collection. No pituitary mass. No inflammatory sinus disease. No
acute calvarial finding. Major vessels at the base of the brain show
flow.

MRI CERVICAL SPINE FINDINGS

There is no abnormality at the foramen magnum, C1-2 or C2-3.

C3-4: Minimal spondylosis with minimal bulging of the disc. No
stenosis.

C4-5: Minimal spondylosis with minimal bulging of the disc. Mild
left-sided facet degeneration. No stenosis.

C5-6: Shallow protrusion of disc material narrows the ventral
subarachnoid space but does not compress the cord. Mild foraminal
encroachment by uncovertebral osteophytes without likely compressive
stenosis.

C6-7:  Bulging of the disc.  No canal or foraminal stenosis.

C7-T1:  Bulging of the disc.  No canal or foraminal stenosis.

No abnormal cord signal.
IMPRESSION: MRI brain: No acute finding. Chronic small-vessel ischemic changes
of the pons. Atrophy and gliosis of the frontal lobes related to
previous planum sphenoidale meningioma with resection. No evidence
of residual or recurrent tumor.

MRI cervical spine: Ordinary age related spondylosis. No advanced
disease. No abnormality seen to explain the clinical presentation.

## 2016-09-16 IMAGING — MR MR HEAD WO/W CM
7 of 13 series · 25 of 48 positions shown · IV contrast (7ml Multihance)
Comparison: Head CT 02/21/2015.  MRI 02/01/2015.  MRI 07/13/2014.

CLINICAL DATA: Left-sided weakness and difficulty walking, 2 days
duration. Previous meningioma resection.

EXAM:
MRI HEAD WITH CONTRAST
MRI CERVICAL SPINE WITHout CONTRAST
TECHNIQUE: Multiplanar, multiecho pulse sequences of the brain and surrounding
structures, and cervical spine, to include the craniocervical
junction and cervicothoracic junction, were obtained with
intravenous contrast. Cervical study was done without contrast.
CONTRAST:  7mL MULTIHANCE GADOBENATE DIMEGLUMINE 529 MG/ML IV SOLN

[Series 2: t1_fl2d_sag · sagittal · 5.0mm · 0.45mm/px · 1 of 20 slices shown]
[im 1/20]
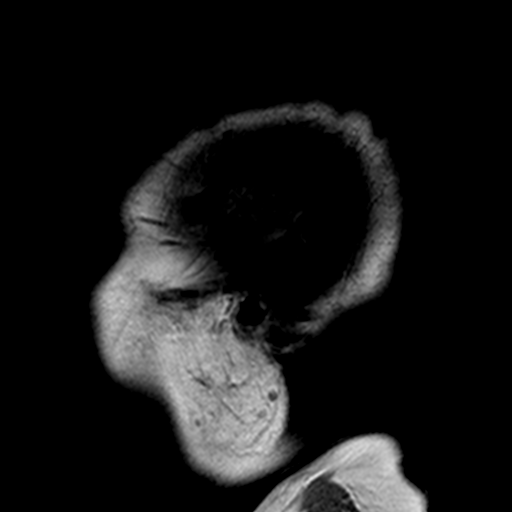

[Series 5: T2 · axial · 5.0mm · 0.75mm/px · z∈[+20,+163]mm · 2 of 23 slices shown]
[im 1/23]
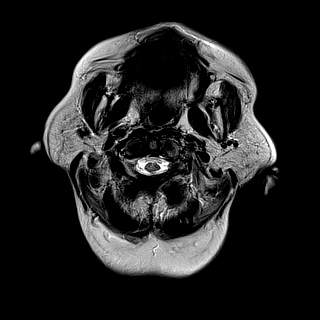
[im 23/23]
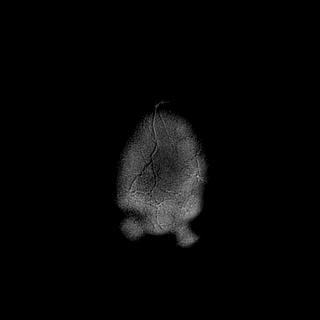

[Series 6: FLAIR · axial · 5.0mm · 0.94mm/px · z∈[+20,+163]mm · 2 of 23 slices shown]
[im 1/23]
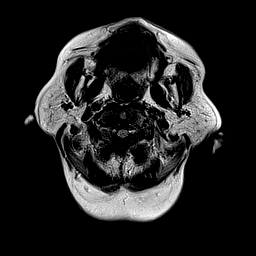
[im 23/23]
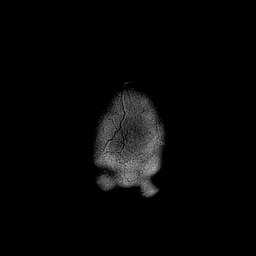

[Series 7: T1 · axial · 2.0mm · 0.47mm/px · z∈[+6,+193]mm · 8 of 95 slices shown]
[im 1/95]
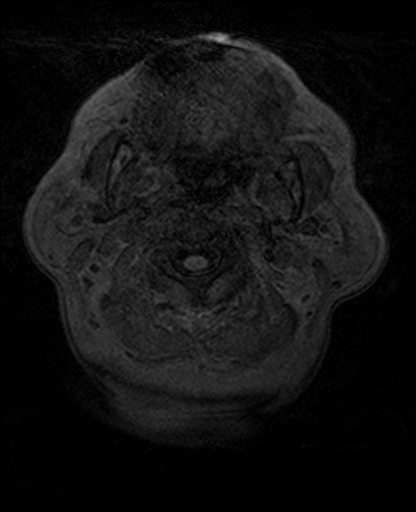
[im 14/95]
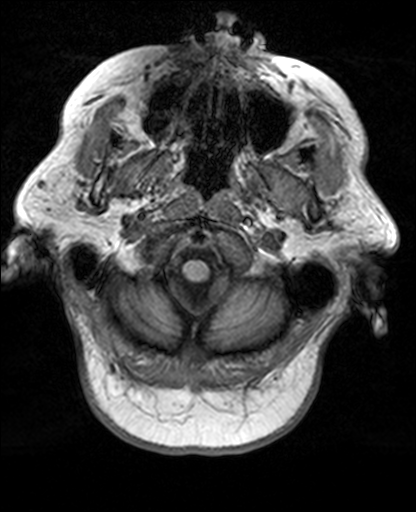
[im 27/95]
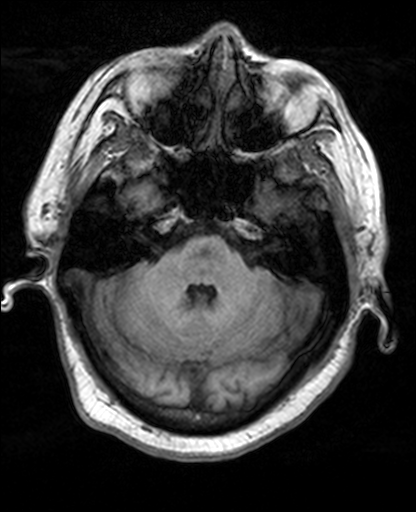
[im 41/95]
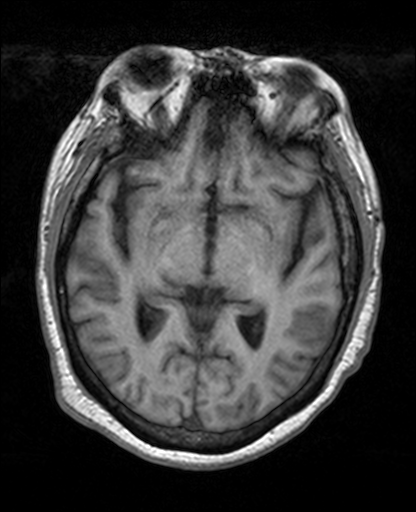
[im 54/95]
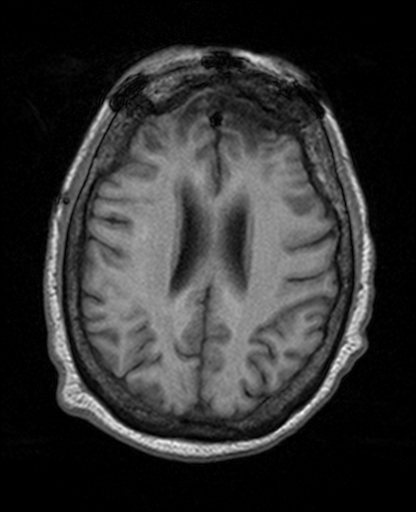
[im 68/95]
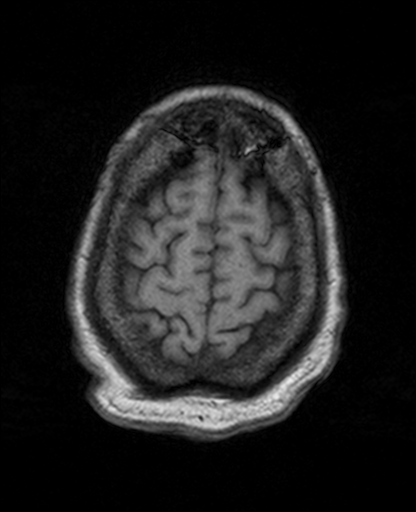
[im 81/95]
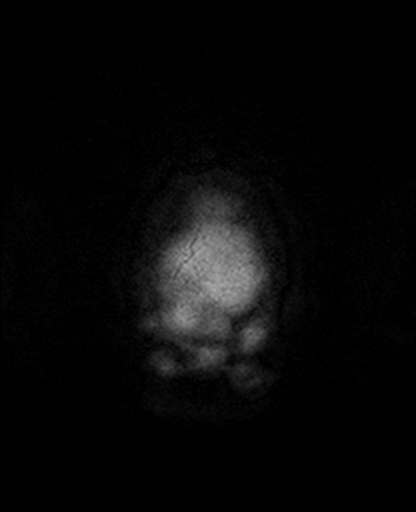
[im 95/95]
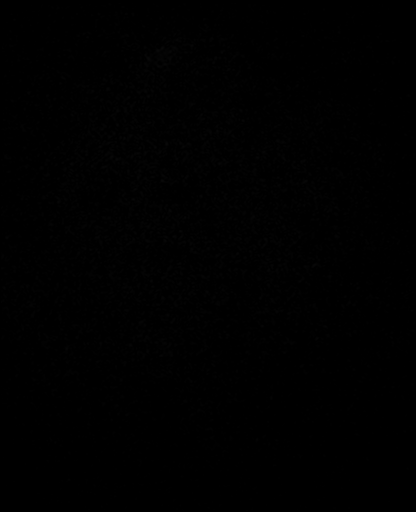

[Series 11: T2 post-contrast · coronal · 5.0mm · 0.64mm/px · 2 of 28 slices shown]
[im 1/28]
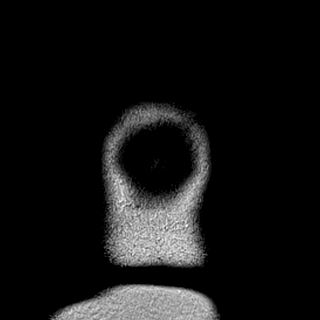
[im 28/28]
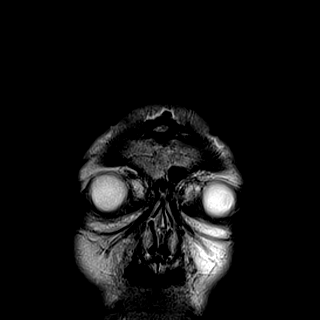

[Series 12: T1 post-contrast · coronal · 5.0mm · 0.43mm/px · 2 of 24 slices shown (1 of 2)]
[im 1/24]
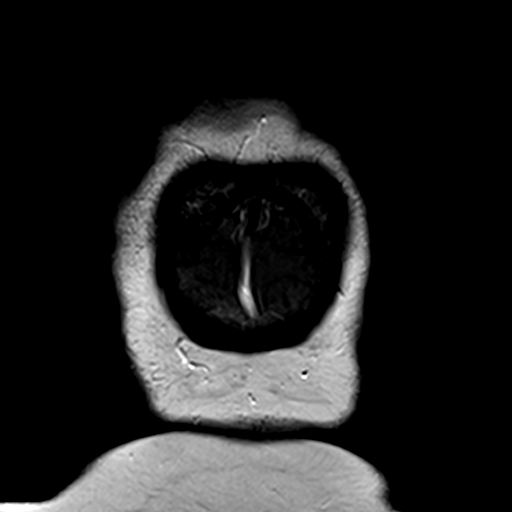
[im 24/24]
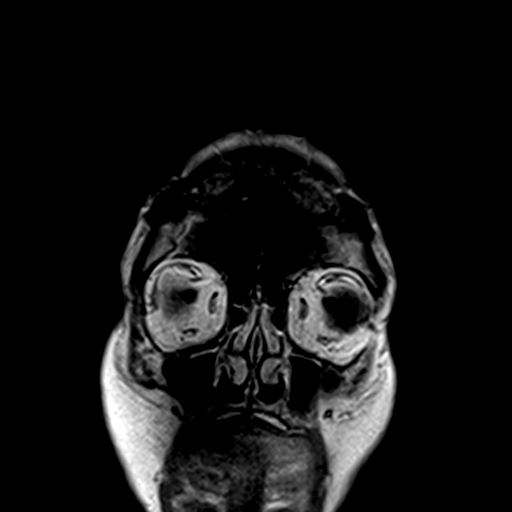

[Series 13: T1 post-contrast · axial · 2.0mm · 0.47mm/px · z∈[+6,+193]mm · 8 of 95 slices shown (2 of 2)]
[im 1/95]
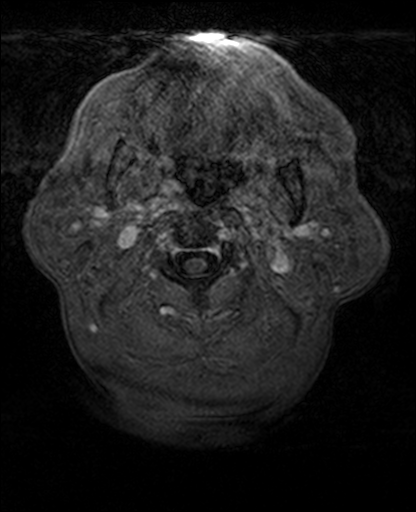
[im 14/95]
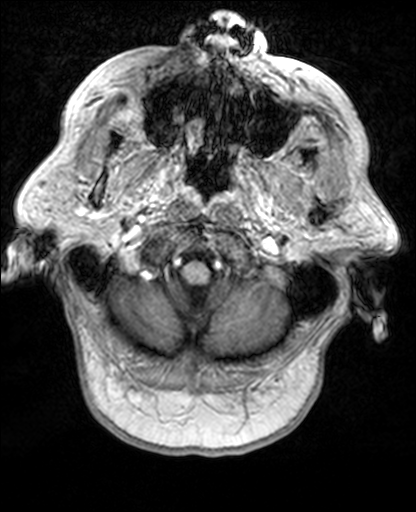
[im 27/95]
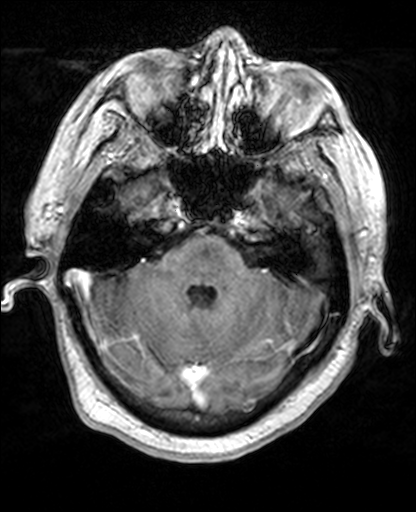
[im 41/95]
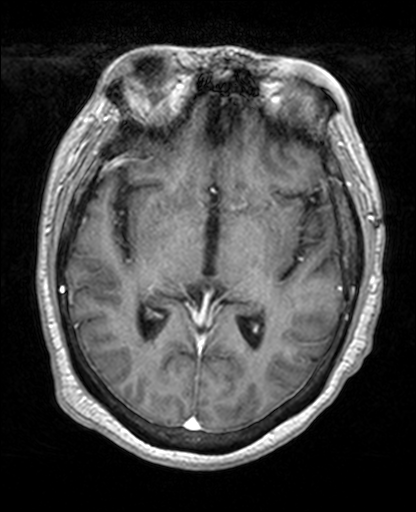
[im 54/95]
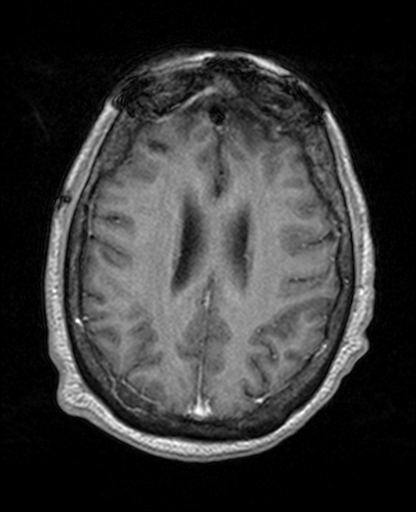
[im 68/95]
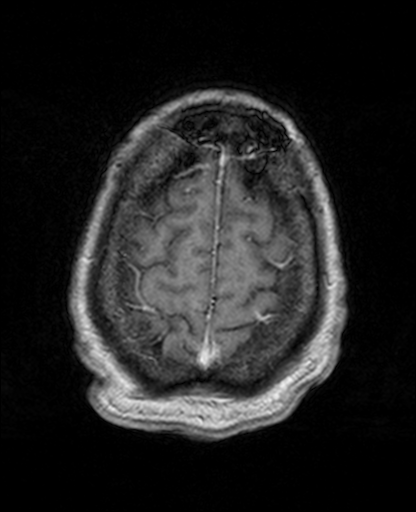
[im 81/95]
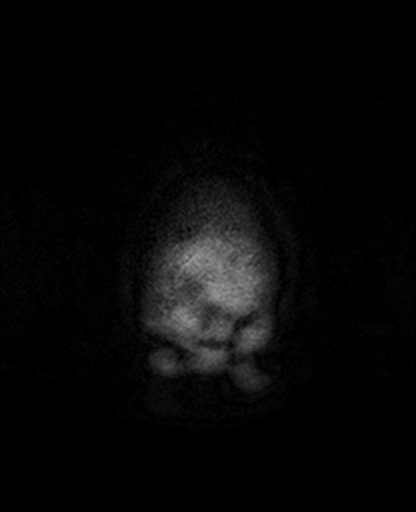
[im 95/95]
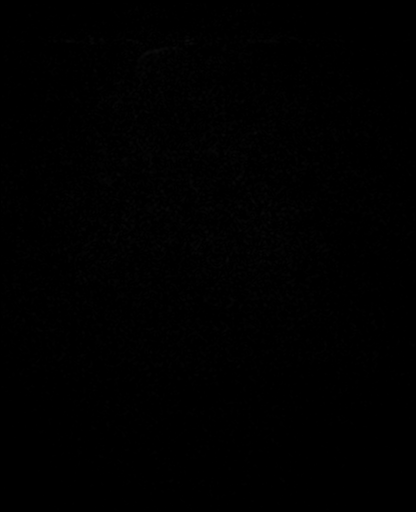

[25 of 48 positions shown; findings below may reference images not displayed]

FINDINGS: MRI HEAD FINDINGS

Diffusion imaging does not show any acute or subacute infarction.
There are extensive chronic small vessel ischemic changes throughout
the pons. No focal cerebellar insult. The cerebral hemispheres show
atrophy, encephalomalacia and gliosis presumably secondary to
previous meningioma and subsequent resection. No evidence of
residual or recurrent meningioma in that region. No intra-axial mass
lesion, acute hemorrhage, hydrocephalus or extra-axial fluid
collection. No pituitary mass. No inflammatory sinus disease. No
acute calvarial finding. Major vessels at the base of the brain show
flow.

MRI CERVICAL SPINE FINDINGS

There is no abnormality at the foramen magnum, C1-2 or C2-3.

C3-4: Minimal spondylosis with minimal bulging of the disc. No
stenosis.

C4-5: Minimal spondylosis with minimal bulging of the disc. Mild
left-sided facet degeneration. No stenosis.

C5-6: Shallow protrusion of disc material narrows the ventral
subarachnoid space but does not compress the cord. Mild foraminal
encroachment by uncovertebral osteophytes without likely compressive
stenosis.

C6-7:  Bulging of the disc.  No canal or foraminal stenosis.

C7-T1:  Bulging of the disc.  No canal or foraminal stenosis.

No abnormal cord signal.
IMPRESSION: MRI brain: No acute finding. Chronic small-vessel ischemic changes
of the pons. Atrophy and gliosis of the frontal lobes related to
previous planum sphenoidale meningioma with resection. No evidence
of residual or recurrent tumor.

MRI cervical spine: Ordinary age related spondylosis. No advanced
disease. No abnormality seen to explain the clinical presentation.

## 2016-09-16 MED ORDER — KCL IN DEXTROSE-NACL 20-5-0.9 MEQ/L-%-% IV SOLN
INTRAVENOUS | Status: DC
  Administered 2016-09-16: 19:00:00 via INTRAVENOUS

## 2016-09-16 MED ORDER — POLYETHYLENE GLYCOL 3350 17 G PO PACK
17.0000 g | PACK | Freq: Two times a day (BID) | ORAL | Status: DC
  Administered 2016-09-16: 17 g via ORAL
  Filled 2016-09-16: qty 1

## 2016-09-16 NOTE — Progress Notes (Signed)
Received verbal order from Dr. Arnoldo Morale for patient to upgrade to a soft diet/carb mod.

## 2016-09-16 NOTE — Progress Notes (Signed)
Notified MD, pt lost iv access and unable to get a.m. Doses of Hydralazine and Labetalol. MD responded and instructed to leave out until this morning when iv team comes. Will continue to monitor patient.

## 2016-09-16 NOTE — Progress Notes (Signed)
Paged Dr. Marin Comment about pt's loss of IV access and multiple attempts at regaining access. Awaiting further orders.

## 2016-09-16 NOTE — Progress Notes (Signed)
PROGRESS NOTE    [name]  XMI:[id number] DOB: 06-Dec-1950 DOA: 09/10/2016 PCP: [name], MD    Brief Narrative: Patient is 66 year old female admitted to the hospital with several days duration of progressively worsening abdominal pain associated with nausea and vomiting, poor oral intake. Found to have small bowel obstruction on CT scan and admitted for supportive management.  Surgery has been following.  Repeated CT showed some improvement, but SBO persisted. Dr Arnoldo Morale was intending to give her full liquid today, but she is having lactose intolerance, so he advanced her diet to soft mechanical, but she was not able to tolerate it. She has increased pain and nausea.  She did have a BM yesterday.    Assessment & Plan:   Principal Problem:   SBO (small bowel obstruction) (HCC) Active Problems:   Hyperlipidemia   GERD   Obstructive sleep apnea   Bipolar disorder (HCC)   Hypothyroidism   COPD (chronic obstructive pulmonary disease) with chronic bronchitis ([place])   Poorly controlled type 2 diabetes mellitus with circulatory disorder (HCC)   Labile hypertension  Small bowel obstruction :  Continue with clear liquid.  Advance when better.  Repeat CT showed improvement.  She could not tolerate soft diet today.  Continue with IVF.   Leukocytosis :  Unclear etiology.  Resolved.   COPD, obstructive sleep apnea - stable respiratory status   Hypertension, accelerated, Tachycardia  - cardiology consulted for assistance - likely exacerbated by acute illness and not taking O blood pressure meds -Continue with IV PRN Hydralazine and Labetelol.   Diabetes mellitus type 2 with long-term use of insulin - stable for now - at home on Insulin Glargine 25 U QHS, currently getting 10 U QHS, will plan to readjust the dose as clinically indicated   Hypothyroidism - Continue IV Synthroid and plan to change to PO once we confirm pt tolerating PO off NGT   Bipolar disorder - On  risperidone at home, resume possibly today or tomorrow   DVT prophylaxis: Lovenox.  Code Status: FULL CODE.  Family Communication: None  Disposition Plan:  Home.   Consultants:   Cardiology  Surgery.   Antimicrobials: Anti-infectives    Start     Dose/Rate Route Frequency Ordered Stop   09/10/16 2200  ciprofloxacin (CIPRO) IVPB 400 mg  Status:  Discontinued     400 mg 200 mL/hr over 60 Minutes Intravenous Every 12 hours 09/10/16 1213 09/11/16 1227   09/10/16 1800  metroNIDAZOLE (FLAGYL) IVPB 500 mg  Status:  Discontinued     500 mg 100 mL/hr over 60 Minutes Intravenous Every 8 hours 09/10/16 1213 09/11/16 1227   09/10/16 0845  ciprofloxacin (CIPRO) IVPB 400 mg     400 mg 200 mL/hr over 60 Minutes Intravenous  Once 09/10/16 0831 09/10/16 1015   09/10/16 0845  metroNIDAZOLE (FLAGYL) IVPB 500 mg     500 mg 100 mL/hr over 60 Minutes Intravenous  Once 09/10/16 0831 09/10/16 1130       Subjective:  No complaints until after soft mech diet.   Objective: Vitals:   09/16/16 0747 09/16/16 1120 09/16/16 1441 09/16/16 1443  BP:  (!) 156/69  (!) 168/66  Pulse:  (!) 115  97  Resp:    15  Temp:    99 F (37.2 C)  TempSrc:    Oral  SpO2: 97%  99% 100%  Weight:      Height:        Intake/Output Summary (Last 24 hours) at  09/16/16 1729 Last data filed at 09/16/16 1300  Gross per 24 hour  Intake              560 ml  Output              500 ml  Net               60 ml   Filed Weights   09/10/16 0423 09/10/16 1144  Weight: 71.2 kg (157 lb) 71.7 kg (158 lb 1.6 oz)    Examination:  General exam: Appears calm and comfortable  Respiratory system: Clear to auscultation. Respiratory effort normal. Cardiovascular system: S1 & S2 heard, RRR. No JVD, murmurs, rubs, gallops or clicks. No pedal edema. Gastrointestinal system: Abdomen is nondistended, soft and nontender. No organomegaly or masses felt. Normal bowel sounds heard. Central nervous system: Alert and oriented. No  focal neurological deficits. Extremities: Symmetric 5 x 5 power. Skin: No rashes, lesions or ulcers Psychiatry: Judgement and insight appear normal. Mood & affect appropriate.   Data Reviewed: I have personally reviewed following labs and imaging studies  CBC:  Recent Labs Lab 09/10/16 0503 09/11/16 0551 09/14/16 0603 09/15/16 0720  WBC 8.4 10.2 13.3* 9.8  NEUTROABS 6.9  --   --   --   HGB 12.6 10.7* 12.5 12.2  HCT 41.5 35.6* 40.8 40.2  MCV 73.3* 74.3* 73.1* 72.8*  PLT 272 249 277 932   Basic Metabolic Panel:  Recent Labs Lab 09/11/16 0551 09/12/16 0631 09/13/16 0547 09/14/16 0603 09/15/16 0720  NA 140 138 143 139 138  K 3.2* 3.8 3.8 3.6 3.3*  CL 107 103 108 107 105  CO2 24 24 22  20* 24  GLUCOSE 137* 161* 132* 164* 163*  BUN 24* 14 18 16 18   CREATININE 0.85 0.69 0.66 0.59 0.75  CALCIUM 8.5* 9.0 9.2 9.1 9.1   Liver Function Tests:  Recent Labs Lab 09/10/16 0503  AST 29  ALT 28  ALKPHOS 127*  BILITOT 0.6  PROT 8.1  ALBUMIN 4.2    Recent Labs Lab 09/10/16 0503  LIPASE 23   CBG:  Recent Labs Lab 09/15/16 1627 09/15/16 2037 09/15/16 2340 09/16/16 0425 09/16/16 1113  GLUCAP 134* 148* 158* 100* 113*   Lipid Profile:  Radiology Studies: Ct Abdomen Pelvis W Contrast  Result Date: 09/15/2016 CLINICAL DATA:  Followup small bowel obstruction. EXAM: CT ABDOMEN AND PELVIS WITH CONTRAST TECHNIQUE: Multidetector CT imaging of the abdomen and pelvis was performed using the standard protocol following bolus administration of intravenous contrast. CONTRAST:  163mL ISOVUE-300 IOPAMIDOL (ISOVUE-300) INJECTION 61%, <See Chart> ISOVUE-300 IOPAMIDOL (ISOVUE-300) INJECTION 61% COMPARISON:  09/10/2016 FINDINGS: Lower chest: The lung bases are clear. No pleural effusion or pulmonary lesions. The heart is normal in size. No pericardial effusion. Hepatobiliary: No focal hepatic lesions. Status post cholecystectomy. Mild stable biliary dilatation. Pancreas: No mass,  inflammation or ductal dilatation. Spleen: Normal size.  No focal lesions. Adrenals/Urinary Tract: The adrenal glands and kidneys are normal. The bladder is normal. Stomach/Bowel: The stomach is unremarkable. The duodenum appears normal. Persistent mild proximal small bowel dilatation with air fluid and air contrast levels. There is a transition to normal/ decompressed small bowel loops and terminal ileum. There is moderate fluid, stool and air scattered in the colon. Findings consistent with a persistent but slightly improved partial small bowel obstruction. This is most likely due to adhesions in the left abdomen. The appendix is normal. Vascular/Lymphatic: Stable moderate atherosclerotic calcifications involving the aorta and iliac arteries. The major  venous structures are patent. No mesenteric or retroperitoneal mass or adenopathy. Reproductive: Surgically absent. Other: No pelvic mass or adenopathy. No free pelvic fluid collections. No inguinal mass or adenopathy. No abdominal wall hernia or subcutaneous lesions. Musculoskeletal: No significant bony findings. Remote surgical changes in the lower lumbar spine with decompressive laminectomies at L4-5 and L5-S1. IMPRESSION: Persistent but slightly improved partial small bowel obstruction. Electronically Signed   By: Marijo Sanes M.D.   On: 09/15/2016 14:48    Scheduled Meds: . Azelastine-Fluticasone  1 puff Nasal QHS  . bisacodyl  10 mg Rectal BID  . clobetasol ointment  1 application Topical Daily  . cycloSPORINE  1 drop Both Eyes BID  . enoxaparin (LOVENOX) injection  40 mg Subcutaneous Q24H  . haloperidol lactate  2 mg Intravenous QHS  . hydrALAZINE  10 mg Intravenous Q6H  . insulin aspart  0-15 Units Subcutaneous Q4H  . insulin glargine  10 Units Subcutaneous QHS  . labetalol  10 mg Intravenous Q6H  . levalbuterol  1.25 mg Nebulization TID  . levothyroxine  25 mcg Intravenous QAC breakfast  . pantoprazole  40 mg Oral Q1200  . polyethylene  glycol  17 g Oral BID  . traZODone  150 mg Oral QHS   Continuous Infusions: . 0.9 % NaCl with KCl 20 mEq / L 20 mL/hr at 09/14/16 1218     LOS: 6 days   Amiri Riechers, MD [facility].   If 7PM-7AM, please contact night-coverage [email or web address] Password [email or web address] 09/16/2016, 5:29 PM

## 2016-09-16 NOTE — Progress Notes (Signed)
Subjective: Patient had a bowel movement yesterday. Denies any nausea or vomiting. She denies any significant abdominal pain.  Objective: Vital signs in last 24 hours: Temp:  [98.4 F (36.9 C)-98.9 F (37.2 C)] 98.4 F (36.9 C) (07/07 0540) Pulse Rate:  [78-109] 88 (07/07 0540) Resp:  [16-20] 18 (07/07 0540) BP: (123-168)/(56-70) 123/70 (07/07 0540) SpO2:  [94 %-99 %] 94 % (07/07 0540) Last BM Date: 09/15/16  Intake/Output from previous day: 07/06 0701 - 07/07 0700 In: 1280.7 [P.O.:240; I.V.:1040.7] Out: -  Intake/Output this shift: No intake/output data recorded.  General appearance: alert, cooperative and no distress GI: Soft, bowel sounds present. No tenderness noted. Patient seems to be back to baseline abdominal habitus.  Lab Results:   Recent Labs  09/14/16 0603 09/15/16 0720  WBC 13.3* 9.8  HGB 12.5 12.2  HCT 40.8 40.2  PLT 277 271   BMET  Recent Labs  09/14/16 0603 09/15/16 0720  NA 139 138  K 3.6 3.3*  CL 107 105  CO2 20* 24  GLUCOSE 164* 163*  BUN 16 18  CREATININE 0.59 0.75  CALCIUM 9.1 9.1   PT/INR No results for input(s): LABPROT, INR in the last 72 hours.  Studies/Results: Ct Abdomen Pelvis W Contrast  Result Date: 09/15/2016 CLINICAL DATA:  Followup small bowel obstruction. EXAM: CT ABDOMEN AND PELVIS WITH CONTRAST TECHNIQUE: Multidetector CT imaging of the abdomen and pelvis was performed using the standard protocol following bolus administration of intravenous contrast. CONTRAST:  135mL ISOVUE-300 IOPAMIDOL (ISOVUE-300) INJECTION 61%, <See Chart> ISOVUE-300 IOPAMIDOL (ISOVUE-300) INJECTION 61% COMPARISON:  09/10/2016 FINDINGS: Lower chest: The lung bases are clear. No pleural effusion or pulmonary lesions. The heart is normal in size. No pericardial effusion. Hepatobiliary: No focal hepatic lesions. Status post cholecystectomy. Mild stable biliary dilatation. Pancreas: No mass, inflammation or ductal dilatation. Spleen: Normal size.  No  focal lesions. Adrenals/Urinary Tract: The adrenal glands and kidneys are normal. The bladder is normal. Stomach/Bowel: The stomach is unremarkable. The duodenum appears normal. Persistent mild proximal small bowel dilatation with air fluid and air contrast levels. There is a transition to normal/ decompressed small bowel loops and terminal ileum. There is moderate fluid, stool and air scattered in the colon. Findings consistent with a persistent but slightly improved partial small bowel obstruction. This is most likely due to adhesions in the left abdomen. The appendix is normal. Vascular/Lymphatic: Stable moderate atherosclerotic calcifications involving the aorta and iliac arteries. The major venous structures are patent. No mesenteric or retroperitoneal mass or adenopathy. Reproductive: Surgically absent. Other: No pelvic mass or adenopathy. No free pelvic fluid collections. No inguinal mass or adenopathy. No abdominal wall hernia or subcutaneous lesions. Musculoskeletal: No significant bony findings. Remote surgical changes in the lower lumbar spine with decompressive laminectomies at L4-5 and L5-S1. IMPRESSION: Persistent but slightly improved partial small bowel obstruction. Electronically Signed   By: Marijo Sanes M.D.   On: 09/15/2016 14:48    Anti-infectives: Anti-infectives    Start     Dose/Rate Route Frequency Ordered Stop   09/10/16 2200  ciprofloxacin (CIPRO) IVPB 400 mg  Status:  Discontinued     400 mg 200 mL/hr over 60 Minutes Intravenous Every 12 hours 09/10/16 1213 09/11/16 1227   09/10/16 1800  metroNIDAZOLE (FLAGYL) IVPB 500 mg  Status:  Discontinued     500 mg 100 mL/hr over 60 Minutes Intravenous Every 8 hours 09/10/16 1213 09/11/16 1227   09/10/16 0845  ciprofloxacin (CIPRO) IVPB 400 mg     400 mg 200  mL/hr over 60 Minutes Intravenous  Once 09/10/16 0831 09/10/16 1015   09/10/16 0845  metroNIDAZOLE (FLAGYL) IVPB 500 mg     500 mg 100 mL/hr over 60 Minutes Intravenous  Once  09/10/16 0831 09/10/16 1130      Assessment/Plan: Impression: Patient has had slow return of bowel function. CT scan of abdomen shows mild improvement. Will advance to full liquid diet. Have encouraged patient ambulate in hallway.  LOS: 6 days    Aviva Signs 09/16/2016

## 2016-09-16 NOTE — Progress Notes (Signed)
The patient started experiencing abdominal pain and nausea post-lunch time meal.  Informed Dr. Marin Comment and he gave a verbal order to change patient back to a clear liquid diet.

## 2016-09-17 DIAGNOSIS — Z0189 Encounter for other specified special examinations: Secondary | ICD-10-CM

## 2016-09-17 LAB — GLUCOSE, CAPILLARY
GLUCOSE-CAPILLARY: 163 mg/dL — AB (ref 65–99)
GLUCOSE-CAPILLARY: 163 mg/dL — AB (ref 65–99)
Glucose-Capillary: 112 mg/dL — ABNORMAL HIGH (ref 65–99)
Glucose-Capillary: 126 mg/dL — ABNORMAL HIGH (ref 65–99)
Glucose-Capillary: 129 mg/dL — ABNORMAL HIGH (ref 65–99)
Glucose-Capillary: 161 mg/dL — ABNORMAL HIGH (ref 65–99)
Glucose-Capillary: 165 mg/dL — ABNORMAL HIGH (ref 65–99)

## 2016-09-17 LAB — BASIC METABOLIC PANEL
Anion gap: 5 (ref 5–15)
BUN: 11 mg/dL (ref 6–20)
CO2: 26 mmol/L (ref 22–32)
CREATININE: 0.73 mg/dL (ref 0.44–1.00)
Calcium: 8.7 mg/dL — ABNORMAL LOW (ref 8.9–10.3)
Chloride: 109 mmol/L (ref 101–111)
GFR calc Af Amer: >60 mL/min (ref >60)
GLUCOSE: 137 mg/dL — AB (ref 65–99)
POTASSIUM: 3.5 mmol/L (ref 3.5–5.1)
Sodium: 140 mmol/L (ref 135–145)

## 2016-09-17 NOTE — Progress Notes (Signed)
Patient continues to refuse to wear a CPAP unit at bedtime; will continue to ask and monitor patient for any changes.

## 2016-09-17 NOTE — Progress Notes (Signed)
PROGRESS NOTE    [name]  MVH:[id number] DOB: Feb 28, 1951 DOA: 09/10/2016 PCP: [name], MD    Brief Narrative:  Patient is 66 year old female admitted to the hospital with several days duration of progressively worsening abdominal pain associated with nausea and vomiting, poor oral intake. Found to have small bowel obstruction on CT scan and admitted for supportive management. Surgery has been following. Repeated CT showed some improvement, but SBO persisted. Dr Arnoldo Morale was intending to give her full liquid today, but she is having lactose intolerance, so he advanced her diet to soft mechanical, but she was not able to tolerate it. She has increased pain and nausea.  She did have a BM yesterday.  She is ready to try soft diet again.  No other complaints.    Assessment & Plan:   Principal Problem:   SBO (small bowel obstruction) (HCC) Active Problems:   Hyperlipidemia   GERD   Obstructive sleep apnea   Bipolar disorder (HCC)   Hypothyroidism   COPD (chronic obstructive pulmonary disease) with chronic bronchitis ([place])   Poorly controlled type 2 diabetes mellitus with circulatory disorder (HCC)   Labile hypertension  Small bowel obstruction : Will try soft diet again.   Repeat CT showed improvement.  She could not tolerate soft diet yesterday.  Continue with IVF.   Leukocytosis : Unclear etiology. Resolved.   COPD, obstructive sleep apnea - stable respiratory status   Hypertension, accelerated, Tachycardia  - cardiology consulted for assistance - likely exacerbated by acute illness and not taking O blood pressure meds -Continue with IV PRN Hydralazine and Labetelol.   Diabetes mellitus type 2 with long-term use of insulin - stable for now - at home on Insulin Glargine 25 U QHS, currently getting 10 U QHS, will plan to readjust the dose as clinically indicated   Hypothyroidism - Continue IV Synthroid and plan to change to PO once we confirm pt tolerating  PO off NGT   Bipolar disorder - On risperidone at home, resume possibly today or tomorrow   DVT prophylaxis:Lovenox.  Code Status:FULL CODE.  Family Communication:None  Disposition Plan:Home.   Consultants:  Cardiology  Surgery.   Antimicrobials:            Anti-infectives     Antimicrobials: Anti-infectives    Start     Dose/Rate Route Frequency Ordered Stop   09/10/16 2200  ciprofloxacin (CIPRO) IVPB 400 mg  Status:  Discontinued     400 mg 200 mL/hr over 60 Minutes Intravenous Every 12 hours 09/10/16 1213 09/11/16 1227   09/10/16 1800  metroNIDAZOLE (FLAGYL) IVPB 500 mg  Status:  Discontinued     500 mg 100 mL/hr over 60 Minutes Intravenous Every 8 hours 09/10/16 1213 09/11/16 1227   09/10/16 0845  ciprofloxacin (CIPRO) IVPB 400 mg     400 mg 200 mL/hr over 60 Minutes Intravenous  Once 09/10/16 0831 09/10/16 1015   09/10/16 0845  metroNIDAZOLE (FLAGYL) IVPB 500 mg     500 mg 100 mL/hr over 60 Minutes Intravenous  Once 09/10/16 0831 09/10/16 1130       Subjective:  No abdominal pain today.   Objective: Vitals:   09/17/16 0531 09/17/16 0654 09/17/16 0751 09/17/16 0956  BP: (!) 123/57 (!) 154/66  (!) 160/62  Pulse: 79 89  (!) 105  Resp:      Temp:      TempSrc:      SpO2: 99%  97%   Weight:      Height:  Intake/Output Summary (Last 24 hours) at 09/17/16 1221 Last data filed at 09/17/16 0900  Gross per 24 hour  Intake             1375 ml  Output              150 ml  Net             1225 ml   Filed Weights   09/10/16 0423 09/10/16 1144  Weight: 71.2 kg (157 lb) 71.7 kg (158 lb 1.6 oz)    Examination:  General exam: Appears calm and comfortable  Respiratory system: Clear to auscultation. Respiratory effort normal. Cardiovascular system: S1 & S2 heard, RRR. No JVD, murmurs, rubs, gallops or clicks. No pedal edema. Gastrointestinal system: Abdomen is nondistended, soft and nontender. No organomegaly or masses felt. Normal bowel  sounds heard. Central nervous system: Alert and oriented. No focal neurological deficits. Extremities: Symmetric 5 x 5 power. Skin: No rashes, lesions or ulcers Psychiatry: Judgement and insight appear normal. Mood & affect appropriate.   Data Reviewed: I have personally reviewed following labs and imaging studies  CBC:  Recent Labs Lab 09/11/16 0551 09/14/16 0603 09/15/16 0720  WBC 10.2 13.3* 9.8  HGB 10.7* 12.5 12.2  HCT 35.6* 40.8 40.2  MCV 74.3* 73.1* 72.8*  PLT 249 277 696   Basic Metabolic Panel:  Recent Labs Lab 09/12/16 0631 09/13/16 0547 09/14/16 0603 09/15/16 0720 09/17/16 0623  NA 138 143 139 138 140  K 3.8 3.8 3.6 3.3* 3.5  CL 103 108 107 105 109  CO2 24 22 20* 24 26  GLUCOSE 161* 132* 164* 163* 137*  BUN 14 18 16 18 11   CREATININE 0.69 0.66 0.59 0.75 0.73  CALCIUM 9.0 9.2 9.1 9.1 8.7*   CBG:  Recent Labs Lab 09/17/16 0031 09/17/16 0354 09/17/16 0653 09/17/16 0745 09/17/16 1118  GLUCAP 112* 165* 129* 126* 161*    Radiology Studies: Ct Abdomen Pelvis W Contrast  Result Date: 09/15/2016 CLINICAL DATA:  Followup small bowel obstruction. EXAM: CT ABDOMEN AND PELVIS WITH CONTRAST TECHNIQUE: Multidetector CT imaging of the abdomen and pelvis was performed using the standard protocol following bolus administration of intravenous contrast. CONTRAST:  165mL ISOVUE-300 IOPAMIDOL (ISOVUE-300) INJECTION 61%, <See Chart> ISOVUE-300 IOPAMIDOL (ISOVUE-300) INJECTION 61% COMPARISON:  09/10/2016 FINDINGS: Lower chest: The lung bases are clear. No pleural effusion or pulmonary lesions. The heart is normal in size. No pericardial effusion. Hepatobiliary: No focal hepatic lesions. Status post cholecystectomy. Mild stable biliary dilatation. Pancreas: No mass, inflammation or ductal dilatation. Spleen: Normal size.  No focal lesions. Adrenals/Urinary Tract: The adrenal glands and kidneys are normal. The bladder is normal. Stomach/Bowel: The stomach is unremarkable. The  duodenum appears normal. Persistent mild proximal small bowel dilatation with air fluid and air contrast levels. There is a transition to normal/ decompressed small bowel loops and terminal ileum. There is moderate fluid, stool and air scattered in the colon. Findings consistent with a persistent but slightly improved partial small bowel obstruction. This is most likely due to adhesions in the left abdomen. The appendix is normal. Vascular/Lymphatic: Stable moderate atherosclerotic calcifications involving the aorta and iliac arteries. The major venous structures are patent. No mesenteric or retroperitoneal mass or adenopathy. Reproductive: Surgically absent. Other: No pelvic mass or adenopathy. No free pelvic fluid collections. No inguinal mass or adenopathy. No abdominal wall hernia or subcutaneous lesions. Musculoskeletal: No significant bony findings. Remote surgical changes in the lower lumbar spine with decompressive laminectomies at L4-5 and L5-S1. IMPRESSION:  Persistent but slightly improved partial small bowel obstruction. Electronically Signed   By: Marijo Sanes M.D.   On: 09/15/2016 14:48    Scheduled Meds: . Azelastine-Fluticasone  1 puff Nasal QHS  . bisacodyl  10 mg Rectal BID  . clobetasol ointment  1 application Topical Daily  . cycloSPORINE  1 drop Both Eyes BID  . enoxaparin (LOVENOX) injection  40 mg Subcutaneous Q24H  . haloperidol lactate  2 mg Intravenous QHS  . hydrALAZINE  10 mg Intravenous Q6H  . insulin aspart  0-15 Units Subcutaneous Q4H  . insulin glargine  10 Units Subcutaneous QHS  . labetalol  10 mg Intravenous Q6H  . levalbuterol  1.25 mg Nebulization TID  . levothyroxine  25 mcg Intravenous QAC breakfast  . pantoprazole  40 mg Oral Q1200  . traZODone  150 mg Oral QHS   Continuous Infusions: . dextrose 5 % and 0.9 % NaCl with KCl 20 mEq/L 50 mL/hr at 09/16/16 1841     LOS: 7 days   Narek Kniss, MD [facility].   If 7PM-7AM, please contact  night-coverage [email or web address] Password [email or web address] 09/17/2016, 12:21 PM

## 2016-09-17 NOTE — Progress Notes (Signed)
Subjective: Patient had mild abdominal pain. Is passing flatus. No emesis noted.  Objective: Vital signs in last 24 hours: Temp:  [98.4 F (36.9 C)-99.3 F (37.4 C)] 98.4 F (36.9 C) (07/08 0355) Pulse Rate:  [79-115] 105 (07/08 0956) Resp:  [15-18] 16 (07/08 0355) BP: (96-182)/(40-69) 160/62 (07/08 0956) SpO2:  [94 %-100 %] 97 % (07/08 0751) Last BM Date: 09/16/16  Intake/Output from previous day: 07/07 0701 - 07/08 0700 In: 0932 [P.O.:720; I.V.:535] Out: 650 [Urine:650] Intake/Output this shift: No intake/output data recorded.  General appearance: alert, cooperative and no distress GI: soft, non-tender; bowel sounds normal; no masses,  no organomegaly  Lab Results:   Recent Labs  09/15/16 0720  WBC 9.8  HGB 12.2  HCT 40.2  PLT 271   BMET  Recent Labs  09/15/16 0720 09/17/16 0623  NA 138 140  K 3.3* 3.5  CL 105 109  CO2 24 26  GLUCOSE 163* 137*  BUN 18 11  CREATININE 0.75 0.73  CALCIUM 9.1 8.7*   PT/INR No results for input(s): LABPROT, INR in the last 72 hours.  Studies/Results: Ct Abdomen Pelvis W Contrast  Result Date: 09/15/2016 CLINICAL DATA:  Followup small bowel obstruction. EXAM: CT ABDOMEN AND PELVIS WITH CONTRAST TECHNIQUE: Multidetector CT imaging of the abdomen and pelvis was performed using the standard protocol following bolus administration of intravenous contrast. CONTRAST:  14mL ISOVUE-300 IOPAMIDOL (ISOVUE-300) INJECTION 61%, <See Chart> ISOVUE-300 IOPAMIDOL (ISOVUE-300) INJECTION 61% COMPARISON:  09/10/2016 FINDINGS: Lower chest: The lung bases are clear. No pleural effusion or pulmonary lesions. The heart is normal in size. No pericardial effusion. Hepatobiliary: No focal hepatic lesions. Status post cholecystectomy. Mild stable biliary dilatation. Pancreas: No mass, inflammation or ductal dilatation. Spleen: Normal size.  No focal lesions. Adrenals/Urinary Tract: The adrenal glands and kidneys are normal. The bladder is normal.  Stomach/Bowel: The stomach is unremarkable. The duodenum appears normal. Persistent mild proximal small bowel dilatation with air fluid and air contrast levels. There is a transition to normal/ decompressed small bowel loops and terminal ileum. There is moderate fluid, stool and air scattered in the colon. Findings consistent with a persistent but slightly improved partial small bowel obstruction. This is most likely due to adhesions in the left abdomen. The appendix is normal. Vascular/Lymphatic: Stable moderate atherosclerotic calcifications involving the aorta and iliac arteries. The major venous structures are patent. No mesenteric or retroperitoneal mass or adenopathy. Reproductive: Surgically absent. Other: No pelvic mass or adenopathy. No free pelvic fluid collections. No inguinal mass or adenopathy. No abdominal wall hernia or subcutaneous lesions. Musculoskeletal: No significant bony findings. Remote surgical changes in the lower lumbar spine with decompressive laminectomies at L4-5 and L5-S1. IMPRESSION: Persistent but slightly improved partial small bowel obstruction. Electronically Signed   By: Marijo Sanes M.D.   On: 09/15/2016 14:48    Anti-infectives: Anti-infectives    Start     Dose/Rate Route Frequency Ordered Stop   09/10/16 2200  ciprofloxacin (CIPRO) IVPB 400 mg  Status:  Discontinued     400 mg 200 mL/hr over 60 Minutes Intravenous Every 12 hours 09/10/16 1213 09/11/16 1227   09/10/16 1800  metroNIDAZOLE (FLAGYL) IVPB 500 mg  Status:  Discontinued     500 mg 100 mL/hr over 60 Minutes Intravenous Every 8 hours 09/10/16 1213 09/11/16 1227   09/10/16 0845  ciprofloxacin (CIPRO) IVPB 400 mg     400 mg 200 mL/hr over 60 Minutes Intravenous  Once 09/10/16 0831 09/10/16 1015   09/10/16 0845  metroNIDAZOLE (FLAGYL)  IVPB 500 mg     500 mg 100 mL/hr over 60 Minutes Intravenous  Once 09/10/16 0831 09/10/16 1130      Assessment/Plan: Impression: Partial bowel obstruction versus  intestinal dysfunction. Patient has had a long-standing history of constipation and has been seen by GI in [facility] for this. In talking with the patient, she appears to be back to baseline. I don't feel exploratory laparotomy would be in her best interest at this time. I did tell her that she would have intermittent episodes of abdominal cramping like she has multiple times in the past. I have encouraged her to ambulate. I anticipate discharge in next 24 hours. She is back on soft diet.  LOS: 7 days    Aviva Signs 09/17/2016

## 2016-09-18 ENCOUNTER — Ambulatory Visit: Payer: Self-pay

## 2016-09-18 ENCOUNTER — Ambulatory Visit: Payer: Self-pay | Admitting: Neurology

## 2016-09-18 DIAGNOSIS — J449 Chronic obstructive pulmonary disease, unspecified: Secondary | ICD-10-CM

## 2016-09-18 LAB — GLUCOSE, CAPILLARY
GLUCOSE-CAPILLARY: 128 mg/dL — AB (ref 65–99)
GLUCOSE-CAPILLARY: 141 mg/dL — AB (ref 65–99)
GLUCOSE-CAPILLARY: 137 mg/dL — AB (ref 65–99)
Glucose-Capillary: 137 mg/dL — ABNORMAL HIGH (ref 65–99)
Glucose-Capillary: 152 mg/dL — ABNORMAL HIGH (ref 65–99)
Glucose-Capillary: 155 mg/dL — ABNORMAL HIGH (ref 65–99)

## 2016-09-18 NOTE — Care Management Important Message (Signed)
Important Message  Patient Details  Name: [name] MRN: [id number] Date of Birth: Apr 06, 1950   Medicare Important Message Given:  Yes    Sherald Barge, RN 09/18/2016, 10:21 AM

## 2016-09-18 NOTE — Progress Notes (Signed)
Discharge instructions given, verbalized understanding, out in stable condition via w/c with staff. 

## 2016-09-18 NOTE — Progress Notes (Signed)
Pt' IV catheter removed and intact. Pt's IV site clean dry and intact. Discharge instructions including medications and follow up appointments were reviewed and discussed with patient. Pt verbalized understanding of discharge instructions. All questions were answered and no further questions at this time. Pt in stable condition and in no acute distress at time of discharge. Pt escorted by nurse tech.

## 2016-09-18 NOTE — Progress Notes (Signed)
  Subjective: Patient had 3 bowel movements chest today. No abdominal pain. Tolerating diet well.  Objective: Vital signs in last 24 hours: Temp:  [98.1 F (36.7 C)-98.3 F (36.8 C)] 98.2 F (36.8 C) (07/09 0416) Pulse Rate:  [93-105] 97 (07/09 0542) Resp:  [16-19] 16 (07/09 0416) BP: (142-197)/(50-80) 160/74 (07/09 0542) SpO2:  [98 %-100 %] 99 % (07/09 0823) Last BM Date: 09/17/16  Intake/Output from previous day: 07/08 0701 - 07/09 0700 In: 2415 [P.O.:1200; I.V.:1215] Out: -  Intake/Output this shift: No intake/output data recorded.  General appearance: alert, cooperative and no distress GI: soft, non-tender; bowel sounds normal; no masses,  no organomegaly  Lab Results:  No results for input(s): WBC, HGB, HCT, PLT in the last 72 hours. BMET  Recent Labs  09/17/16 0623  NA 140  K 3.5  CL 109  CO2 26  GLUCOSE 137*  BUN 11  CREATININE 0.73  CALCIUM 8.7*   PT/INR No results for input(s): LABPROT, INR in the last 72 hours.  Studies/Results: No results found.  Anti-infectives: Anti-infectives    Start     Dose/Rate Route Frequency Ordered Stop   09/10/16 2200  ciprofloxacin (CIPRO) IVPB 400 mg  Status:  Discontinued     400 mg 200 mL/hr over 60 Minutes Intravenous Every 12 hours 09/10/16 1213 09/11/16 1227   09/10/16 1800  metroNIDAZOLE (FLAGYL) IVPB 500 mg  Status:  Discontinued     500 mg 100 mL/hr over 60 Minutes Intravenous Every 8 hours 09/10/16 1213 09/11/16 1227   09/10/16 0845  ciprofloxacin (CIPRO) IVPB 400 mg     400 mg 200 mL/hr over 60 Minutes Intravenous  Once 09/10/16 0831 09/10/16 1015   09/10/16 0845  metroNIDAZOLE (FLAGYL) IVPB 500 mg     500 mg 100 mL/hr over 60 Minutes Intravenous  Once 09/10/16 0831 09/10/16 1130      Assessment/Plan: Impression: Bowel obstruction, resolved. Plan: Agree with discharge today. Will sign off.  LOS: 8 days    Aviva Signs 09/18/2016

## 2016-09-18 NOTE — Discharge Summary (Signed)
Physician Discharge Summary  [name] JGG:[id number] DOB: April 20, 1950 DOA: 09/10/2016  PCP: [name], MD  Admit date: 09/10/2016 Discharge date: 09/18/2016  Admitted From:  Home.  Disposition:  Home.   Recommendations for Outpatient Follow-up:  1. Follow up with PCP in 1-2 weeks 2. Please obtain BMP/CBC in one week 3. Please follow up on the following pending results:  Home Health: Already had.  Equipment/Devices: None.  Discharge Condition: Able to eat.  BM x 3.  No nausea or abdominal pain. CODE STATUS: full code.  Diet recommendation: Soft diet.   Brief/Interim Summary: Patient was admitted for SBO by dr Roderic Palau on September 10, 2016.  As per his prior H and P:  "  [name] is a 66 y.o. female with medical history significant of COPD, bipolar disorder, hypertension, diabetes. Patient presents to the hospital with abdominal pain. Reports onset of symptoms yesterday with associated vomiting. She had a formed bowel movement prior to arrival, but has not passed any flatus in the past several days. No dysuria. She has noticed that her abdomen is more distended. She is not having shortness of breath, chest pain, fever.  ED Course: Patient was evaluated in emergency room a CT scan indicated high-grade small bowel obstruction. Lab work was otherwise unremarkable. She was noted to be hypertensive. She is now referred for admission.  HOSPITAL COURSE:   Patient was admitted and her oral meds were converted to IV meds.  Her BP was originally elevated, and with addition of Labetalol and hydralazine, it became better controlled.  Her thyroid supplement was given IV.  For her SBO, she was given IVF and made NPO.  Surgery was consulted, and Dr Arnoldo Morale saw her and guided therapy.  She was having trouble with advancing her diet, and a follow up CT was done.  It showed improvement, and her diet was advanced once more.  She is able to tolerate it, and she had three bowel movements. She did have  leukocytosis, without evidence of infection, but it normalized without intervention.  Her meds will be resumed in the oral forms.   She is now ready for discharged, and will be discharged to home.  She already had personal help at home.  She should and will follow up with her PCP next week.  Thank you for letting me participate in her care.  Good Day.   Discharge Diagnoses:  Principal Problem:   SBO (small bowel obstruction) (HCC) Active Problems:   Hyperlipidemia   GERD   Obstructive sleep apnea   Bipolar disorder (HCC)   Hypothyroidism   COPD (chronic obstructive pulmonary disease) with chronic bronchitis (HCC)   Poorly controlled type 2 diabetes mellitus with circulatory disorder (HCC)   Labile hypertension  Discharge Instructions  Discharge Instructions    Diet - low sodium heart healthy    Complete by:  As directed    Discharge instructions    Complete by:  As directed    Take your medicines as directed only.  Follow up with your PCP next week.   Increase activity slowly    Complete by:  As directed      Allergies as of 09/18/2016      Reactions   Cephalexin Hives   Iron Nausea And Vomiting   Milk-related Compounds Other (See Comments)   Doesn't agree with stomach.    Penicillins Hives   Has patient had a PCN reaction causing immediate rash, facial/tongue/throat swelling, SOB or lightheadedness with hypotension: Yes Has  patient had a PCN reaction causing severe rash involving mucus membranes or skin necrosis: No Has patient had a PCN reaction that required hospitalization No Has patient had a PCN reaction occurring within the last 10 years: No If all of the above answers are "NO", then may proceed with Cephalosporin use.   Phenazopyridine Hcl Hives           Medication List    STOP taking these medications   dicyclomine 10 MG capsule Commonly known as:  BENTYL   Magnesium Citrate 200 MG Tabs   methocarbamol 500 MG tablet Commonly known as:  ROBAXIN   vitamin E  400 UNIT capsule     TAKE these medications   ACCU-CHEK FASTCLIX LANCETS Misc Use to test blood sugar 4 times daily. Dx: E10.65   ACCU-CHEK NANO SMARTVIEW w/Device Kit 1 each by Does not apply route daily. Dx: E10.65   ACCU-CHEK SMARTVIEW CONTROL Liqd 1 each by Other route as needed.   ACCU-CHEK SMARTVIEW test strip Generic drug:  glucose blood TEST BLOOD SUGAR FOUR TIMES DAILY AS DIRECTED   albuterol 108 (90 Base) MCG/ACT inhaler Commonly known as:  PROVENTIL HFA;VENTOLIN HFA Inhale 1-2 puffs every 6 hours as needed for wheezing, shortness of breath   amLODipine 5 MG tablet Commonly known as:  NORVASC TAKE 2 TABLETS BY MOUTH ONCE DAILY.   aspirin EC 81 MG tablet Take 81 mg by mouth daily.   Azelastine-Fluticasone 137-50 MCG/ACT Susp Commonly known as:  DYMISTA Place 1 puff into the nose at bedtime.   B-D SINGLE USE SWABS REGULAR Pads Use for injections and glucose testing 8 times daily. Dx: E10.65   BD PEN NEEDLE NANO U/F 32G X 4 MM Misc Generic drug:  Insulin Pen Needle USE FOUR TIMES DAILY   clobetasol cream 0.05 % Commonly known as:  TEMOVATE Apply 1 application topically daily.   cloNIDine 0.3 MG tablet Commonly known as:  CATAPRES TAKE 1 TABLET BY MOUTH EVERY EIGHT HOURS. ONCE AT 8AM, 4PM, AND 12 MIDNIGHT.   Coenzyme Q-10 100 MG capsule Take 1 capsule (100 mg total) by mouth 3 (three) times daily.   diclofenac sodium 1 % Gel Commonly known as:  VOLTAREN Apply 2 g topically daily as needed (Pain).   hydroxychloroquine 200 MG tablet Commonly known as:  PLAQUENIL Take 200 mg by mouth daily.   Insulin Glargine 300 UNIT/ML Sopn Commonly known as:  TOUJEO SOLOSTAR Inject 25 Units into the skin at bedtime.   lamoTRIgine 100 MG tablet Commonly known as:  LAMICTAL Take 1 tablet (100 mg total) by mouth 2 (two) times daily.   levothyroxine 50 MCG tablet Commonly known as:  SYNTHROID, LEVOTHROID TAKE 1 TABLET BY MOUTH ONCE DAILY AND 1/2 TABLET ON  SUNDAYS.   LORazepam 0.5 MG tablet Commonly known as:  ATIVAN Take 1 tablet (0.5 mg total) by mouth 3 (three) times daily.   losartan 50 MG tablet Commonly known as:  COZAAR TAKE ONE TABLET BY MOUTH DAILY.   metoprolol tartrate 50 MG tablet Commonly known as:  LOPRESSOR TAKE 1 TABLET BY MOUTH TWICE DAILY.   montelukast 10 MG tablet Commonly known as:  SINGULAIR TAKE ONE TABLET BY MOUTH ONCE DAILY.   NOVOLOG FLEXPEN 100 UNIT/ML FlexPen Generic drug:  insulin aspart INJECT 16 TO 30 UNITS INTO THE SKIN 3 (THREE) TIMES DAILY WITH MEALS.   nystatin powder Commonly known as:  MYCOSTATIN/NYSTOP APPLY TO AFFECTED AREA FOUR TIMES DAILY.   ondansetron 4 MG tablet Commonly known as:  ZOFRAN Take 1 tablet (4 mg total) by mouth every 8 (eight) hours as needed for nausea or vomiting.   ONE-A-DAY 50 PLUS PO Take 1 tablet by mouth daily.   pregabalin 75 MG capsule Commonly known as:  LYRICA Take 75 mg by mouth 2 (two) times daily.   RABEprazole 20 MG tablet Commonly known as:  ACIPHEX Take 1 tablet (20 mg total) by mouth 2 (two) times daily.   RESTASIS 0.05 % ophthalmic emulsion Generic drug:  cycloSPORINE Place 1 drop into both eyes 2 (two) times daily.   Riboflavin 400 MG Tabs Take 400 mg by mouth daily.   risperiDONE 0.5 MG tablet Commonly known as:  RISPERDAL Take 1 tablet (0.5 mg total) by mouth at bedtime.   rosuvastatin 5 MG tablet Commonly known as:  CRESTOR TAKE 1 TABLET BY MOUTH AT BEDTIME.   sertraline 100 MG tablet Commonly known as:  ZOLOFT Take 2 tablets (200 mg total) by mouth daily.   traZODone 150 MG tablet Commonly known as:  DESYREL Take 1 tablet (150 mg total) by mouth at bedtime.   vitamin B-12 1000 MCG tablet Commonly known as:  CYANOCOBALAMIN Take 1,000 mcg by mouth daily.   Vitamin D 2000 units Caps Take 1 capsule by mouth daily.      Follow-up Information    [name], MD Follow up in 1 week(s).   Specialty:  Family  Medicine Contact information: [address], [address] [place] [address] [phone]          Allergies  Allergen Reactions  . Cephalexin Hives  . Iron Nausea And Vomiting  . Milk-Related Compounds Other (See Comments)    Doesn't agree with stomach.   . Penicillins Hives    Has patient had a PCN reaction causing immediate rash, facial/tongue/throat swelling, SOB or lightheadedness with hypotension: Yes Has patient had a PCN reaction causing severe rash involving mucus membranes or skin necrosis: No Has patient had a PCN reaction that required hospitalization No Has patient had a PCN reaction occurring within the last 10 years: No If all of the above answers are "NO", then may proceed with Cephalosporin use.   [name] Phenazopyridine Hcl Hives          Consultations:  Surgery.    Procedures/Studies: Ct Abdomen Pelvis W Contrast  Result Date: 09/15/2016 CLINICAL DATA:  Followup small bowel obstruction. EXAM: CT ABDOMEN AND PELVIS WITH CONTRAST TECHNIQUE: Multidetector CT imaging of the abdomen and pelvis was performed using the standard protocol following bolus administration of intravenous contrast. CONTRAST:  14m ISOVUE-300 IOPAMIDOL (ISOVUE-300) INJECTION 61%, <See Chart> ISOVUE-300 IOPAMIDOL (ISOVUE-300) INJECTION 61% COMPARISON:  09/10/2016 FINDINGS: Lower chest: The lung bases are clear. No pleural effusion or pulmonary lesions. The heart is normal in size. No pericardial effusion. Hepatobiliary: No focal hepatic lesions. Status post cholecystectomy. Mild stable biliary dilatation. Pancreas: No mass, inflammation or ductal dilatation. Spleen: Normal size.  No focal lesions. Adrenals/Urinary Tract: The adrenal glands and kidneys are normal. The bladder is normal. Stomach/Bowel: The stomach is unremarkable. The duodenum appears normal. Persistent mild proximal small bowel dilatation with air fluid and air contrast levels. There is a transition to normal/ decompressed small bowel  loops and terminal ileum. There is moderate fluid, stool and air scattered in the colon. Findings consistent with a persistent but slightly improved partial small bowel obstruction. This is most likely due to adhesions in the left abdomen. The appendix is normal. Vascular/Lymphatic: Stable moderate atherosclerotic calcifications involving the aorta and  iliac arteries. The major venous structures are patent. No mesenteric or retroperitoneal mass or adenopathy. Reproductive: Surgically absent. Other: No pelvic mass or adenopathy. No free pelvic fluid collections. No inguinal mass or adenopathy. No abdominal wall hernia or subcutaneous lesions. Musculoskeletal: No significant bony findings. Remote surgical changes in the lower lumbar spine with decompressive laminectomies at L4-5 and L5-S1. IMPRESSION: Persistent but slightly improved partial small bowel obstruction. Electronically Signed   By: Marijo Sanes M.D.   On: 09/15/2016 14:48   Ct Abdomen Pelvis W Contrast  Result Date: 09/10/2016 CLINICAL DATA:  History of oral carcinoma. Abdominal pain with nausea and vomiting beginning last night. EXAM: CT ABDOMEN AND PELVIS WITH CONTRAST TECHNIQUE: Multidetector CT imaging of the abdomen and pelvis was performed using the standard protocol following bolus administration of intravenous contrast. CONTRAST:  146m ISOVUE-300 IOPAMIDOL (ISOVUE-300) INJECTION 61% COMPARISON:  05/14/2016 FINDINGS: Lower chest: Mild basilar atelectasis. No pleural or pericardial fluid. Hepatobiliary: No liver parenchymal lesion. Previous cholecystectomy. Pancreas: Normal Spleen: Normal Adrenals/Urinary Tract: Adrenal glands are normal. Kidneys are normal. No cyst, mass, stone or hydronephrosis. Normal appearance of the bladder. Stomach/Bowel: Small bowel obstruction, fairly high-grade with March caliber change in the mid ileum. Normal appearing appendix. Free intraperitoneal fluid, small in volume. Some strandiness of the mesentery in the  right mid abdomen probably related to some fluid distribution. The possibility of peritoneal nodules is not completely excluded. Vascular/Lymphatic: Aortic atherosclerosis. No aneurysm. IVC is normal. No retroperitoneal adenopathy. Reproductive: Previous hysterectomy.  No pelvic mass. Other: No free air. Musculoskeletal: Chronic lower lumbar degenerative changes. Previous decompressive surgery. IMPRESSION: Acute high-grade small bowel obstruction, probably in the mid ileum. Small a moderate amount of free intraperitoneal fluid. Some density in the mesenteric in the right abdomen the probably represents fluid distribution, but the possibility of peritoneal nodules is not completely excluded. Electronically Signed   By: MNelson ChimesM.D.   On: 09/10/2016 08:00   Dg Chest Port 1 View  Result Date: 09/10/2016 CLINICAL DATA:  Nasogastric placement. EXAM: PORTABLE CHEST 1 VIEW COMPARISON:  Earlier same day FINDINGS: Nasogastric tube enters the stomach. Tip is not visible but this extends at least to the mid body. Slightly more prominent atelectasis at the right lung base. IMPRESSION: Nasogastric tube enters the stomach with the tip at least at the mid body of the stomach. Slight worsening of right lower lobe atelectasis. Electronically Signed   By: MNelson ChimesM.D.   On: 09/10/2016 14:09   Dg Abdomen Acute W/chest  Result Date: 09/10/2016 CLINICAL DATA:  66y/o  F; abdominal pain with nausea and vomiting. EXAM: DG ABDOMEN ACUTE W/ 1V CHEST COMPARISON:  05/14/2016 CT abdomen and pelvis FINDINGS: Stable normal cardiac silhouette. Aortic atherosclerosis with calcification. A focal low lung volumes accentuate pulmonary markings. Minor bibasilar atelectasis. No pleural effusion or pneumothorax. Right upper quadrant surgical clips, presumably cholecystectomy. Mildly dilated loops of small bowel in the left hemiabdomen. IMPRESSION: 1. Low lung volumes and minor bibasilar atelectasis. 2. Mildly dilated loops of small bowel  in left hemiabdomen may represent obstruction or ileus. Electronically Signed   By: LKristine GarbeM.D.   On: 09/10/2016 06:04   Mm Screening Breast Tomo Bilateral  Result Date: 09/07/2016 CLINICAL DATA:  Screening. EXAM: 2D DIGITAL SCREENING BILATERAL MAMMOGRAM WITH CAD AND ADJUNCT TOMO COMPARISON:  Previous exam(s). ACR Breast Density Category a: The breast tissue is almost entirely fatty. FINDINGS: There are no findings suspicious for malignancy. Images were processed with CAD. IMPRESSION: No mammographic evidence of  malignancy. A result letter of this screening mammogram will be mailed directly to the patient. RECOMMENDATION: Screening mammogram in one year. (Code:SM-B-01Y) BI-RADS CATEGORY  1: Negative. Electronically Signed   By: Lajean Manes M.D.   On: 09/07/2016 16:16       Subjective:   Discharge Exam: Vitals:   09/18/16 0416 09/18/16 0542  BP: (!) 162/60 (!) 160/74  Pulse: 96 97  Resp: 16   Temp: 98.2 F (36.8 C)    Vitals:   09/18/16 0054 09/18/16 0416 09/18/16 0542 09/18/16 0823  BP: (!) 142/58 (!) 162/60 (!) 160/74   Pulse: 98 96 97   Resp:  16    Temp:  98.2 F (36.8 C)    TempSrc:  Oral    SpO2:  99%  99%  Weight:      Height:        General: Pt is alert, awake, not in acute distress Cardiovascular: RRR, S1/S2 +, no rubs, no gallops Respiratory: CTA bilaterally, no wheezing, no rhonchi Abdominal: Soft, NT, ND, bowel sounds + Extremities: no edema, no cyanosis    The results of significant diagnostics from this hospitalization (including imaging, microbiology, ancillary and laboratory) are listed below for reference.     Microbiology: No results found for this or any previous visit (from the past 240 hour(s)).   Labs: BNP (last 3 results) No results for input(s): BNP in the last 8760 hours. Basic Metabolic Panel:  Recent Labs Lab 09/12/16 0631 09/13/16 0547 09/14/16 0603 09/15/16 0720 09/17/16 0623  NA 138 143 139 138 140  K 3.8  3.8 3.6 3.3* 3.5  CL 103 108 107 105 109  CO2 24 22 20* 24 26  GLUCOSE 161* 132* 164* 163* 137*  BUN 14 18 16 18 11   CREATININE 0.69 0.66 0.59 0.75 0.73  CALCIUM 9.0 9.2 9.1 9.1 8.7*   Liver Function Tests: No results for input(s): AST, ALT, ALKPHOS, BILITOT, PROT, ALBUMIN in the last 168 hours. No results for input(s): LIPASE, AMYLASE in the last 168 hours. No results for input(s): AMMONIA in the last 168 hours. CBC:  Recent Labs Lab 09/14/16 0603 09/15/16 0720  WBC 13.3* 9.8  HGB 12.5 12.2  HCT 40.8 40.2  MCV 73.1* 72.8*  PLT 277 271   Cardiac Enzymes: No results for input(s): CKTOTAL, CKMB, CKMBINDEX, TROPONINI in the last 168 hours. BNP: Invalid input(s): POCBNP CBG:  Recent Labs Lab 09/17/16 1636 09/17/16 1945 09/18/16 0010 09/18/16 0415 09/18/16 0728  GLUCAP 163* 163* 155* 141* 137*   D-Dimer No results for input(s): DDIMER in the last 72 hours. Hgb A1c No results for input(s): HGBA1C in the last 72 hours. Lipid Profile No results for input(s): CHOL, HDL, LDLCALC, TRIG, CHOLHDL, LDLDIRECT in the last 72 hours. Thyroid function studies No results for input(s): TSH, T4TOTAL, T3FREE, THYROIDAB in the last 72 hours.  Invalid input(s): FREET3 Anemia work up No results for input(s): VITAMINB12, FOLATE, FERRITIN, TIBC, IRON, RETICCTPCT in the last 72 hours. Urinalysis    Component Value Date/Time   COLORURINE STRAW (A) 09/10/2016 0535   APPEARANCEUR CLEAR 09/10/2016 0535   LABSPEC 1.010 09/10/2016 0535   PHURINE 7.0 09/10/2016 0535   GLUCOSEU >=500 (A) 09/10/2016 0535   HGBUR NEGATIVE 09/10/2016 0535   HGBUR negative 03/29/2010 0834   BILIRUBINUR NEGATIVE 09/10/2016 0535   BILIRUBINUR negative 10/25/2015 1044   KETONESUR NEGATIVE 09/10/2016 0535   PROTEINUR 30 (A) 09/10/2016 0535   UROBILINOGEN 0.2 10/25/2015 1044   UROBILINOGEN 0.2 06/02/2014 0140  NITRITE NEGATIVE 09/10/2016 [place] 09/10/2016 0535   Sepsis Labs Invalid  input(s): PROCALCITONIN,  WBC,  LACTICIDVEN Microbiology No results found for this or any previous visit (from the past 240 hour(s)).   Time coordinating discharge: Over 30 minutes  SIGNED:   Orvan Falconer, MD FACP [facility] 09/18/2016, 8:33 AM   If 7PM-7AM, please contact night-coverage [email or web address] Password [email or web address]

## 2016-09-18 NOTE — Care Management Note (Signed)
Case Management Note  Patient Details  Name: [name] MRN: [id number] Date of Birth: 11-18-1950   Expected Discharge Date:  09/18/16               Expected Discharge Plan:  [place]  In-House Referral:  NA  Discharge planning Services  CM Consult  Post Acute Care Choice:  Home Health, Resumption of Svcs/PTA Provider Choice offered to:  Patient  HH Arranged:  RN, PT [facility] Agency:  [facility]  Status of Service:  Completed, signed off  Additional Comments: Pt will DC home today. Her [place] services will resume through [facility] with the addition of PT. Pt aware. Pt rely's on [facility] RN for fix medications out for her and she is concerned about changes to her regimine after DC and needs RN as soon as possible to fix her pill boxes for the changes made. Sarah, [employer] rep, aware of DC and pt requests. [facility] will make arrangements for RN to come today after DC. Pt also requesting neb tx. Dr. Marin Comment notified of pt requests and states nebs are not appropriate at this time, pt aware.   Sherald Barge, RN 09/18/2016, 10:21 AM

## 2016-09-19 ENCOUNTER — Ambulatory Visit (HOSPITAL_COMMUNITY): Payer: Self-pay | Admitting: Psychiatry

## 2016-09-19 ENCOUNTER — Telehealth: Payer: Self-pay | Admitting: Family Medicine

## 2016-09-19 ENCOUNTER — Other Ambulatory Visit: Payer: Self-pay | Admitting: Internal Medicine

## 2016-09-19 DIAGNOSIS — K56609 Unspecified intestinal obstruction, unspecified as to partial versus complete obstruction: Secondary | ICD-10-CM

## 2016-09-19 DIAGNOSIS — I69354 Hemiplegia and hemiparesis following cerebral infarction affecting left non-dominant side: Secondary | ICD-10-CM | POA: Diagnosis not present

## 2016-09-19 DIAGNOSIS — E1159 Type 2 diabetes mellitus with other circulatory complications: Secondary | ICD-10-CM

## 2016-09-19 DIAGNOSIS — I509 Heart failure, unspecified: Secondary | ICD-10-CM | POA: Diagnosis not present

## 2016-09-19 DIAGNOSIS — I272 Pulmonary hypertension, unspecified: Secondary | ICD-10-CM

## 2016-09-19 DIAGNOSIS — J449 Chronic obstructive pulmonary disease, unspecified: Secondary | ICD-10-CM | POA: Diagnosis not present

## 2016-09-19 DIAGNOSIS — E1165 Type 2 diabetes mellitus with hyperglycemia: Principal | ICD-10-CM

## 2016-09-19 DIAGNOSIS — F17210 Nicotine dependence, cigarettes, uncomplicated: Secondary | ICD-10-CM | POA: Diagnosis not present

## 2016-09-19 DIAGNOSIS — G40909 Epilepsy, unspecified, not intractable, without status epilepticus: Secondary | ICD-10-CM | POA: Diagnosis not present

## 2016-09-19 DIAGNOSIS — F250 Schizoaffective disorder, bipolar type: Secondary | ICD-10-CM | POA: Diagnosis not present

## 2016-09-19 DIAGNOSIS — I110 Hypertensive heart disease with heart failure: Secondary | ICD-10-CM | POA: Diagnosis not present

## 2016-09-19 DIAGNOSIS — Z9181 History of falling: Secondary | ICD-10-CM | POA: Diagnosis not present

## 2016-09-19 DIAGNOSIS — E119 Type 2 diabetes mellitus without complications: Secondary | ICD-10-CM | POA: Diagnosis not present

## 2016-09-19 NOTE — Telephone Encounter (Signed)
New Message  Pt voiced she would like to speak with the nurse about her Dx from ED.   Pt wanted a sooner appt than 10/17/2016 which is first available and pt declined to see MD-Nelson.  Please f/u

## 2016-09-19 NOTE — Telephone Encounter (Signed)
Hospital f/u scheduled and labs ordered and pt aware

## 2016-09-19 NOTE — Telephone Encounter (Signed)
Pt voiced office is needing last visit office notes faxed to info below:  Attn: Clinic Review [phone] (f)

## 2016-09-19 NOTE — Addendum Note (Signed)
Addended by: Eual Fines on: 09/19/2016 02:41 PM   Modules accepted: Orders

## 2016-09-20 ENCOUNTER — Telehealth: Payer: Self-pay

## 2016-09-20 NOTE — Telephone Encounter (Signed)
Faxed over per pt

## 2016-09-21 ENCOUNTER — Telehealth: Payer: Self-pay | Admitting: Family Medicine

## 2016-09-21 ENCOUNTER — Ambulatory Visit: Payer: Self-pay

## 2016-09-21 ENCOUNTER — Telehealth: Payer: Self-pay | Admitting: Internal Medicine

## 2016-09-21 DIAGNOSIS — J449 Chronic obstructive pulmonary disease, unspecified: Secondary | ICD-10-CM | POA: Diagnosis not present

## 2016-09-21 DIAGNOSIS — I69354 Hemiplegia and hemiparesis following cerebral infarction affecting left non-dominant side: Secondary | ICD-10-CM | POA: Diagnosis not present

## 2016-09-21 DIAGNOSIS — F250 Schizoaffective disorder, bipolar type: Secondary | ICD-10-CM | POA: Diagnosis not present

## 2016-09-21 DIAGNOSIS — G40909 Epilepsy, unspecified, not intractable, without status epilepticus: Secondary | ICD-10-CM | POA: Diagnosis not present

## 2016-09-21 DIAGNOSIS — Z9181 History of falling: Secondary | ICD-10-CM | POA: Diagnosis not present

## 2016-09-21 DIAGNOSIS — F17210 Nicotine dependence, cigarettes, uncomplicated: Secondary | ICD-10-CM | POA: Diagnosis not present

## 2016-09-21 DIAGNOSIS — I509 Heart failure, unspecified: Secondary | ICD-10-CM | POA: Diagnosis not present

## 2016-09-21 DIAGNOSIS — E119 Type 2 diabetes mellitus without complications: Secondary | ICD-10-CM | POA: Diagnosis not present

## 2016-09-21 DIAGNOSIS — I110 Hypertensive heart disease with heart failure: Secondary | ICD-10-CM | POA: Diagnosis not present

## 2016-09-21 NOTE — Telephone Encounter (Signed)
Pt states she was in the hospital recently for a SBO. Per hospital records pt had 3 BM's prior to discharge. Pt is concerned because she continues to have issue with constipation and does not want this to happen again. Requesting to be seen by Dr. Hilarie Fredrickson. Pt scheduled to see Dr. Hilarie Fredrickson 11/28/16@3pm . Pt aware of appt.

## 2016-09-21 NOTE — Telephone Encounter (Signed)
Unable to reach pt

## 2016-09-21 NOTE — Telephone Encounter (Signed)
[name] called the on call number last night at 5:50.  She states she had missed a call from this office and needed to know what we wanted. I informed her that I was unable to assist her with this, and that she should call the office in the morning.  She said " just get [name] on the phone".  I informed her that the office was closed and [name] was at home.  She asked who I was, and I identified myself.  She said " I didn't call you, get off my phone".  She then hung up.

## 2016-09-22 ENCOUNTER — Telehealth: Payer: Self-pay | Admitting: Family Medicine

## 2016-09-22 ENCOUNTER — Telehealth: Payer: Self-pay

## 2016-09-22 DIAGNOSIS — E119 Type 2 diabetes mellitus without complications: Secondary | ICD-10-CM | POA: Diagnosis not present

## 2016-09-22 DIAGNOSIS — Z9181 History of falling: Secondary | ICD-10-CM | POA: Diagnosis not present

## 2016-09-22 DIAGNOSIS — F17210 Nicotine dependence, cigarettes, uncomplicated: Secondary | ICD-10-CM | POA: Diagnosis not present

## 2016-09-22 DIAGNOSIS — G40909 Epilepsy, unspecified, not intractable, without status epilepticus: Secondary | ICD-10-CM | POA: Diagnosis not present

## 2016-09-22 DIAGNOSIS — J449 Chronic obstructive pulmonary disease, unspecified: Secondary | ICD-10-CM | POA: Diagnosis not present

## 2016-09-22 DIAGNOSIS — I69354 Hemiplegia and hemiparesis following cerebral infarction affecting left non-dominant side: Secondary | ICD-10-CM | POA: Diagnosis not present

## 2016-09-22 DIAGNOSIS — I110 Hypertensive heart disease with heart failure: Secondary | ICD-10-CM | POA: Diagnosis not present

## 2016-09-22 DIAGNOSIS — F250 Schizoaffective disorder, bipolar type: Secondary | ICD-10-CM | POA: Diagnosis not present

## 2016-09-22 DIAGNOSIS — I509 Heart failure, unspecified: Secondary | ICD-10-CM | POA: Diagnosis not present

## 2016-09-22 NOTE — Telephone Encounter (Signed)
New Message  Nurse voiced pt has not had a bowel movement since Monday when the pt was dx from ED.  Extra dose of Miralax last night but has been used daily and miracle mag once earlier in the week  Bowel sounds has been absent.  Please f/u

## 2016-09-22 NOTE — Telephone Encounter (Signed)
Called pt to reschedule Medicare Annual Wellness Visit. Patient expressed concerns about transportation so provided transportation resources including one through [place]

## 2016-09-22 NOTE — Telephone Encounter (Signed)
[name] was hospitalized with a bowel obstruction   She had nothing to eat for several days , so there will not be as much BM for a while - however it is worrisome if nothing is moving.  Continue BID miralax.  Lots of fluids.  Solids as tolerated.  IF she has distension, or pain, or fever, or vomiting, must go back to the ER

## 2016-09-22 NOTE — Telephone Encounter (Signed)
Called [name], aware, and states understanding of dr Thera Flake advice. States she has only had " a dime size BM since Monday.Juluis Rainier.

## 2016-09-25 ENCOUNTER — Ambulatory Visit (HOSPITAL_COMMUNITY): Payer: Self-pay | Admitting: Psychiatry

## 2016-09-25 ENCOUNTER — Other Ambulatory Visit: Payer: Self-pay | Admitting: Internal Medicine

## 2016-09-25 ENCOUNTER — Ambulatory Visit (INDEPENDENT_AMBULATORY_CARE_PROVIDER_SITE_OTHER): Payer: Medicare HMO | Admitting: Family Medicine

## 2016-09-25 ENCOUNTER — Other Ambulatory Visit (HOSPITAL_COMMUNITY): Payer: Self-pay | Admitting: Psychiatry

## 2016-09-25 ENCOUNTER — Encounter: Payer: Self-pay | Admitting: Family Medicine

## 2016-09-25 ENCOUNTER — Ambulatory Visit: Payer: Self-pay | Admitting: Family Medicine

## 2016-09-25 ENCOUNTER — Telehealth: Payer: Self-pay | Admitting: Family Medicine

## 2016-09-25 VITALS — BP 124/74 | HR 70 | Resp 16 | Ht 59.0 in | Wt 156.0 lb

## 2016-09-25 DIAGNOSIS — Z1211 Encounter for screening for malignant neoplasm of colon: Secondary | ICD-10-CM | POA: Diagnosis not present

## 2016-09-25 DIAGNOSIS — F17200 Nicotine dependence, unspecified, uncomplicated: Secondary | ICD-10-CM | POA: Diagnosis not present

## 2016-09-25 DIAGNOSIS — I110 Hypertensive heart disease with heart failure: Secondary | ICD-10-CM | POA: Diagnosis not present

## 2016-09-25 DIAGNOSIS — E1165 Type 2 diabetes mellitus with hyperglycemia: Secondary | ICD-10-CM | POA: Diagnosis not present

## 2016-09-25 DIAGNOSIS — G40909 Epilepsy, unspecified, not intractable, without status epilepticus: Secondary | ICD-10-CM | POA: Diagnosis not present

## 2016-09-25 DIAGNOSIS — E1159 Type 2 diabetes mellitus with other circulatory complications: Secondary | ICD-10-CM

## 2016-09-25 DIAGNOSIS — R0989 Other specified symptoms and signs involving the circulatory and respiratory systems: Secondary | ICD-10-CM

## 2016-09-25 DIAGNOSIS — I509 Heart failure, unspecified: Secondary | ICD-10-CM | POA: Diagnosis not present

## 2016-09-25 DIAGNOSIS — I69354 Hemiplegia and hemiparesis following cerebral infarction affecting left non-dominant side: Secondary | ICD-10-CM | POA: Diagnosis not present

## 2016-09-25 DIAGNOSIS — K5909 Other constipation: Secondary | ICD-10-CM | POA: Diagnosis not present

## 2016-09-25 DIAGNOSIS — F17210 Nicotine dependence, cigarettes, uncomplicated: Secondary | ICD-10-CM | POA: Diagnosis not present

## 2016-09-25 DIAGNOSIS — J449 Chronic obstructive pulmonary disease, unspecified: Secondary | ICD-10-CM | POA: Diagnosis not present

## 2016-09-25 DIAGNOSIS — Z9181 History of falling: Secondary | ICD-10-CM | POA: Diagnosis not present

## 2016-09-25 DIAGNOSIS — Z09 Encounter for follow-up examination after completed treatment for conditions other than malignant neoplasm: Secondary | ICD-10-CM | POA: Diagnosis not present

## 2016-09-25 DIAGNOSIS — E119 Type 2 diabetes mellitus without complications: Secondary | ICD-10-CM | POA: Diagnosis not present

## 2016-09-25 DIAGNOSIS — F250 Schizoaffective disorder, bipolar type: Secondary | ICD-10-CM | POA: Diagnosis not present

## 2016-09-25 LAB — POC HEMOCCULT BLD/STL (OFFICE/1-CARD/DIAGNOSTIC): FECAL OCCULT BLD: NEGATIVE

## 2016-09-25 NOTE — Telephone Encounter (Signed)
Patient would like a smoking cessation aide

## 2016-09-25 NOTE — Patient Instructions (Addendum)
Physical exam Nov 10 or after.  CBC and bMP today  Use cold compress to back for pain  Foot exam today shows reduced sensation, callus, flat feet  Use dulcolax siupossitory and /or milk of l magnesia to help with bowels     Blood pressure is good  Need to work on stopping smoking entirely, cigarettes do not stop pain!   Steps to Quit Smoking Smoking tobacco can be bad for your health. It can also affect almost every organ in your body. Smoking puts you and people around you at risk for many serious long-lasting (chronic) diseases. Quitting smoking is hard, but it is one of the best things that you can do for your health. It is never too late to quit. What are the benefits of quitting smoking? When you quit smoking, you lower your risk for getting serious diseases and conditions. They can include:  Lung cancer or lung disease.  Heart disease.  Stroke.  Heart attack.  Not being able to have children (infertility).  Weak bones (osteoporosis) and broken bones (fractures).  If you have coughing, wheezing, and shortness of breath, those symptoms may get better when you quit. You may also get sick less often. If you are pregnant, quitting smoking can help to lower your chances of having a baby of low birth weight. What can I do to help me quit smoking? Talk with your doctor about what can help you quit smoking. Some things you can do (strategies) include:  Quitting smoking totally, instead of slowly cutting back how much you smoke over a period of time.  Going to in-person counseling. You are more likely to quit if you go to many counseling sessions.  Using resources and support systems, such as: ? Database administrator with a Social worker. ? Phone quitlines. ? Careers information officer. ? Support groups or group counseling. ? Text messaging programs. ? Mobile phone apps or applications.  Taking medicines. Some of these medicines may have nicotine in them. If you are pregnant or  breastfeeding, do not take any medicines to quit smoking unless your doctor says it is okay. Talk with your doctor about counseling or other things that can help you.  Talk with your doctor about using more than one strategy at the same time, such as taking medicines while you are also going to in-person counseling. This can help make quitting easier. What things can I do to make it easier to quit? Quitting smoking might feel very hard at first, but there is a lot that you can do to make it easier. Take these steps:  Talk to your family and friends. Ask them to support and encourage you.  Call phone quitlines, reach out to support groups, or work with a Social worker.  Ask people who smoke to not smoke around you.  Avoid places that make you want (trigger) to smoke, such as: ? Bars. ? Parties. ? Smoke-break areas at work.  Spend time with people who do not smoke.  Lower the stress in your life. Stress can make you want to smoke. Try these things to help your stress: ? Getting regular exercise. ? Deep-breathing exercises. ? Yoga. ? Meditating. ? Doing a body scan. To do this, close your eyes, focus on one area of your body at a time from head to toe, and notice which parts of your body are tense. Try to relax the muscles in those areas.  Download or buy apps on your mobile phone or tablet that can help you  stick to your quit plan. There are many free apps, such as QuitGuide from the [employer] Office manager for Disease Control and Prevention). You can find more support from smokefree.gov and other websites.  This information is not intended to replace advice given to you by your health care provider. Make sure you discuss any questions you have with your health care provider. Document Released: 12/24/2008 Document Revised: 10/26/2015 Document Reviewed: 07/14/2014 Elsevier Interactive Patient Education  2018 [employer].

## 2016-09-25 NOTE — Telephone Encounter (Signed)
New Message  Pt voiced she would like to try the albuterol to stop smoking.  Pt voiced to contact her before the end of business today.  Please f/u

## 2016-09-26 ENCOUNTER — Telehealth: Payer: Self-pay | Admitting: Family Medicine

## 2016-09-26 DIAGNOSIS — Z9181 History of falling: Secondary | ICD-10-CM | POA: Diagnosis not present

## 2016-09-26 DIAGNOSIS — I509 Heart failure, unspecified: Secondary | ICD-10-CM | POA: Diagnosis not present

## 2016-09-26 DIAGNOSIS — I69354 Hemiplegia and hemiparesis following cerebral infarction affecting left non-dominant side: Secondary | ICD-10-CM | POA: Diagnosis not present

## 2016-09-26 DIAGNOSIS — I110 Hypertensive heart disease with heart failure: Secondary | ICD-10-CM | POA: Diagnosis not present

## 2016-09-26 DIAGNOSIS — F250 Schizoaffective disorder, bipolar type: Secondary | ICD-10-CM | POA: Diagnosis not present

## 2016-09-26 DIAGNOSIS — G40909 Epilepsy, unspecified, not intractable, without status epilepticus: Secondary | ICD-10-CM | POA: Diagnosis not present

## 2016-09-26 DIAGNOSIS — E119 Type 2 diabetes mellitus without complications: Secondary | ICD-10-CM | POA: Diagnosis not present

## 2016-09-26 DIAGNOSIS — J449 Chronic obstructive pulmonary disease, unspecified: Secondary | ICD-10-CM | POA: Diagnosis not present

## 2016-09-26 DIAGNOSIS — F17210 Nicotine dependence, cigarettes, uncomplicated: Secondary | ICD-10-CM | POA: Diagnosis not present

## 2016-09-26 NOTE — Telephone Encounter (Signed)
Direct contact made with patient , she reports having mental health issues, I am recommending not calling over the next week as I will again be unavailable, and as I had a direct concern voiced by Dr Meda Coffee this morning about a call this morning about a very negative telephone call from [facility] which was difficult for her to understand,  [name] herself reports she is currently "mad" / "angry"and is relying on her mental health Providers and also her faith to get through this period Started crying by the end of the conversation.  States she does not to be angry. Agrees and understands she should not call Dr Meda Coffee after hours until she gets to feeling better, , and states she will apologize to dr Meda Coffee when she gets to feeling better when she means it, and she will

## 2016-09-26 NOTE — Telephone Encounter (Signed)
Spoke with pt earlier today requested wellbutrin, has seizure histroy therefore this is not appropriate. I recommend the nicorette gum and smoking cessation  classes which are available through the health dept , or the nicotrol inhaler or patch , whichever is covered , pls get back with me and let me know, she was not keen on either but the wellbutrin is contraindicated with her h/o seizures. No definite response on pt preference for smoking cessation tool received

## 2016-09-27 ENCOUNTER — Other Ambulatory Visit: Payer: Self-pay | Admitting: Family Medicine

## 2016-09-27 ENCOUNTER — Ambulatory Visit (INDEPENDENT_AMBULATORY_CARE_PROVIDER_SITE_OTHER): Payer: Medicare HMO | Admitting: Psychiatry

## 2016-09-27 ENCOUNTER — Encounter (HOSPITAL_COMMUNITY): Payer: Self-pay | Admitting: Psychiatry

## 2016-09-27 ENCOUNTER — Telehealth (HOSPITAL_COMMUNITY): Payer: Self-pay | Admitting: *Deleted

## 2016-09-27 VITALS — BP 135/67 | HR 70 | Ht 59.0 in

## 2016-09-27 DIAGNOSIS — F331 Major depressive disorder, recurrent, moderate: Secondary | ICD-10-CM

## 2016-09-27 DIAGNOSIS — Z811 Family history of alcohol abuse and dependence: Secondary | ICD-10-CM

## 2016-09-27 DIAGNOSIS — D539 Nutritional anemia, unspecified: Secondary | ICD-10-CM | POA: Diagnosis not present

## 2016-09-27 DIAGNOSIS — D649 Anemia, unspecified: Secondary | ICD-10-CM | POA: Diagnosis not present

## 2016-09-27 DIAGNOSIS — K56609 Unspecified intestinal obstruction, unspecified as to partial versus complete obstruction: Secondary | ICD-10-CM | POA: Diagnosis not present

## 2016-09-27 MED ORDER — SERTRALINE HCL 100 MG PO TABS: 200.0000 mg | ORAL_TABLET | Freq: Every day | ORAL | 2 refills | Status: DC

## 2016-09-27 MED ORDER — TRAZODONE HCL 100 MG PO TABS: 200.0000 mg | ORAL_TABLET | Freq: Every day | ORAL | 2 refills | Status: DC

## 2016-09-27 MED ORDER — RISPERIDONE 0.5 MG PO TABS: 0.5000 mg | ORAL_TABLET | Freq: Every day | ORAL | 2 refills | Status: DC

## 2016-09-27 MED ORDER — LORAZEPAM 0.5 MG PO TABS: 0.5000 mg | ORAL_TABLET | Freq: Three times a day (TID) | ORAL | 2 refills | Status: DC

## 2016-09-27 NOTE — Telephone Encounter (Signed)
Pt came in for f/u appt and before she left she stated she wanted office to send her script to [facility] mail order.
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
## 2016-09-28 ENCOUNTER — Ambulatory Visit (INDEPENDENT_AMBULATORY_CARE_PROVIDER_SITE_OTHER): Payer: Medicare HMO | Admitting: Psychiatry

## 2016-09-28 DIAGNOSIS — F331 Major depressive disorder, recurrent, moderate: Secondary | ICD-10-CM | POA: Diagnosis not present

## 2016-09-28 LAB — CMP14+EGFR
A/G RATIO: 1.8 (ref 1.2–2.2)
ALT: 17 IU/L (ref 0–32)
AST: 18 IU/L (ref 0–40)
Albumin: 4.4 g/dL (ref 3.6–4.8)
Alkaline Phosphatase: 119 IU/L — ABNORMAL HIGH (ref 39–117)
BUN/Creatinine Ratio: 15 (ref 12–28)
BUN: 15 mg/dL (ref 8–27)
Bilirubin Total: 0.3 mg/dL (ref 0.0–1.2)
CALCIUM: 9.1 mg/dL (ref 8.7–10.3)
CO2: 20 mmol/L (ref 20–29)
CREATININE: 1.03 mg/dL — AB (ref 0.57–1.00)
Chloride: 107 mmol/L — ABNORMAL HIGH (ref 96–106)
GFR, EST AFRICAN AMERICAN: 65 mL/min/{1.73_m2} (ref >59)
GFR, EST NON AFRICAN AMERICAN: 57 mL/min/{1.73_m2} — AB (ref >59)
Globulin, Total: 2.5 g/dL (ref 1.5–4.5)
Glucose: 171 mg/dL — ABNORMAL HIGH (ref 65–99)
POTASSIUM: 4.1 mmol/L (ref 3.5–5.2)
Sodium: 145 mmol/L — ABNORMAL HIGH (ref 134–144)
TOTAL PROTEIN: 6.9 g/dL (ref 6.0–8.5)

## 2016-09-28 LAB — CBC
Hematocrit: 34.9 % (ref 34.0–46.6)
Hemoglobin: 10.5 g/dL — ABNORMAL LOW (ref 11.1–15.9)
MCH: 22.8 pg — ABNORMAL LOW (ref 26.6–33.0)
MCHC: 30.1 g/dL — ABNORMAL LOW (ref 31.5–35.7)
MCV: 76 fL — AB (ref 79–97)
PLATELETS: 292 10*3/uL (ref 150–379)
RBC: 4.61 x10E6/uL (ref 3.77–5.28)
RDW: 15.2 % (ref 12.3–15.4)
WBC: 4.8 10*3/uL (ref 3.4–10.8)

## 2016-09-28 NOTE — Progress Notes (Signed)
Patient:  [name]   DOB: 03/16/1950  MR Number: [id number]  Location: [place]:  [address] [place],  [place],   [place]  Start: Thursday 09/28/2016 10:10 AM End:       Thursday 09/28/2016 11:00 AM  Provider/Observer:     Maurice Small, MSW, LCSW   Chief Complaint:              Depression  Reason For Service:               [name] is a 66 y.o. female who presents with a long standing history of recurrent periods of depression beginning when she was 29 and her favorite uncle died. She has been experiencing increased symptoms of depression recently due to stress in the relationship with her son. Per patient's report, he rarely visits her anymore due to his involvement with an older woman who is controlling. She also reports frustration regarding her knees who is her power of attorn      ey and is very demanding per      patient's report. She reports issues with other family members and states she needs help dealing with her family Patient reports multiple psychiatric hospitlaizations due to depression and suicidal ideations with thel last one occuring in 1997. Patient has participated in outpatient psychotherapy and medication management intermittently since age 59.  She currently is seeing psychiatrist Dr. Harrington Challenger . Prior to this, she was being seen at [facility]. Patient also has had ECT at [facility].    Interventions Strategy:  Supportive  Participation Level:   Active      Participation Quality:  Appropriate      Behavioral Observation:  Casual, fairly groomed, drowsy, speech very slow   Current Psychosocial Factors: Tension in relationship with son, multiple health issues, concerns about grandson  Content of Session:   reviewed symptoms, discussed stressors, assisted patient identify triggers of increased anger and irritability,  assisted patient identify calming and relaxation techniques, assigned  patient to practice daily   Current Status:   Anger,  irritability, loud voice, excessive talking, insomnia,   Suicidal/Homicidal:    No   Patient Progress:   Fair. Patient reports continued increased irritability and anger since last session. She reports at least 2-3 incidents in which she has been verbally aggressive since last session. She reports she was hospitalized at the beginning of the month for about a week for a bowel obstruction and says she started becoming mean after she got out of the hospital.   Target Goals:   1.  Learn and implement behavioral strategies to overcome depression.    2.  Verbalize an understanding and resolution of current interpersonal problems.   Last Reviewed:   07/18/2016  Goals Addressed Today:      Plan:      Return again in 2 - 3 weeks.  Impression/Diagnosis:   Patient presents with long standing history of recurrent periods  of depression beginning when she was [age] and her favorite uncle died. Patient reports multiple psychiatric hospitlaizations due to depression and suicidal ideations with thel last one occuring in 1997. Patient has participated in outpatient psychotherapy and medication management intermittently since age 56.  She currently is seeing psychiatrist Dr. Harrington Challenger . Prior to this, she was being seen at [facility]. Patient also has had ECT at [facility]. Symptoms have worsened in recent months due to family stress. Current symptoms include depressed mood, anxiety, excessive worry, and tearfulness.   Diagnosis:  Axis I: MDD recurrent episode, moderate          Axis II: Deferred     Janayah Zavada, LCSW 09/28/2016

## 2016-09-30 DIAGNOSIS — I639 Cerebral infarction, unspecified: Secondary | ICD-10-CM | POA: Diagnosis not present

## 2016-09-30 DIAGNOSIS — R32 Unspecified urinary incontinence: Secondary | ICD-10-CM | POA: Diagnosis not present

## 2016-09-30 DIAGNOSIS — M150 Primary generalized (osteo)arthritis: Secondary | ICD-10-CM | POA: Diagnosis not present

## 2016-10-02 ENCOUNTER — Ambulatory Visit: Payer: Self-pay | Admitting: Family Medicine

## 2016-10-02 IMAGING — MR MR PELVIS W/O CM
9 of 11 series · 32 of 48 positions shown · non-contrast
Comparison: None.

CLINICAL DATA: Constipation. Sensation of incomplete evacuation
during defecation. Rectocele.

EXAM:
MRI PELVIS WITHOUT CONTRAST
TECHNIQUE: Multiplanar multisequence MR imaging of the pelvis was performed. No
intravenous contrast was administered. Ultrasound gel was
administered per rectum. Dynamic imaging was performed during
straining and defecation.

[Series 3: t1_tse axial · axial · 4.0mm · 0.49mm/px · z∈[-16,+119]mm · 2 of 28 slices shown]
[im 1/28]
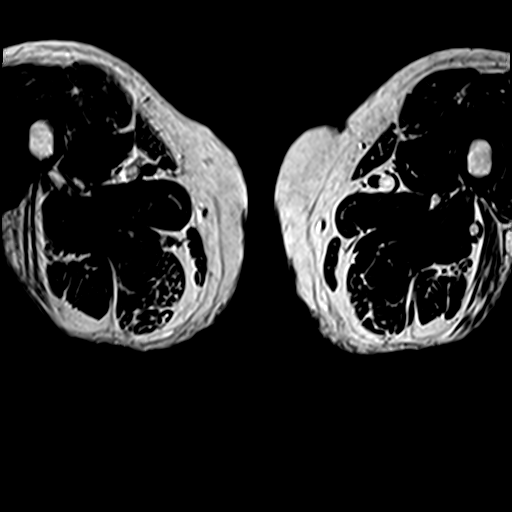
[im 28/28]
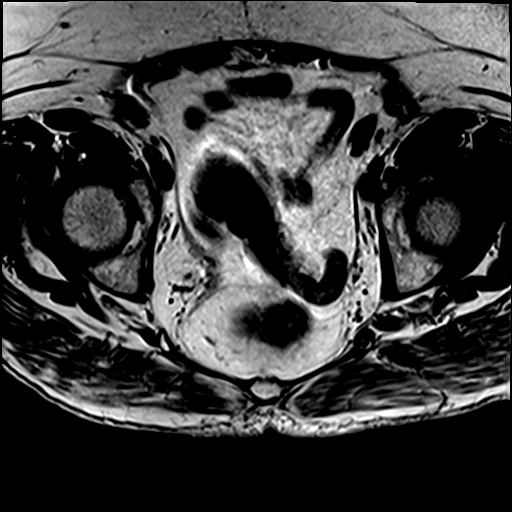

[Series 4: axial spgr · axial · 4.0mm · 0.78mm/px · z∈[-16,+119]mm · 3 of 28 slices shown]
[im 1/28]
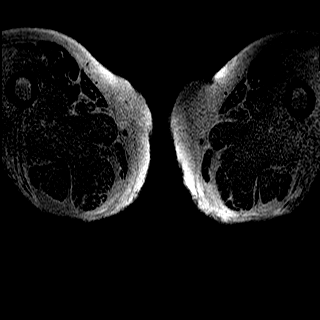
[im 14/28]
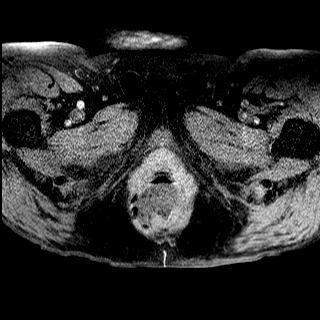
[im 28/28]
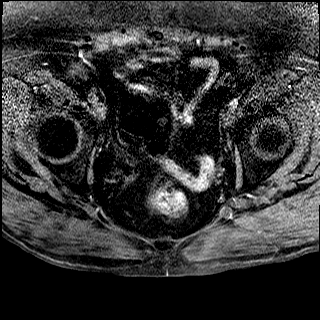

[Series 5: t2_tse_cor · coronal · 4.0mm · 1.37mm/px · 3 of 28 slices shown]
[im 1/28]
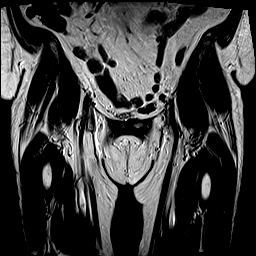
[im 14/28]
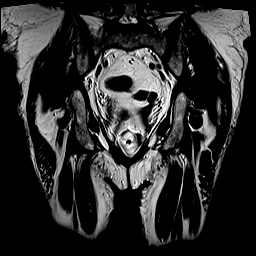
[im 28/28]
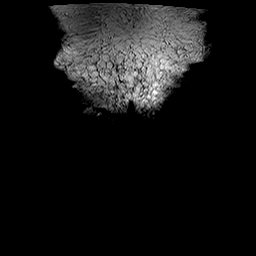

[Series 6: t2_tse axial rest · axial · 4.0mm · 0.98mm/px · z∈[-5,+120]mm · 3 of 26 slices shown]
[im 1/26]
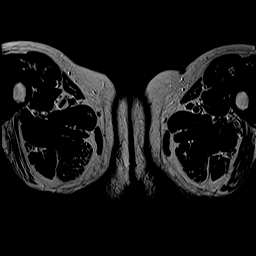
[im 13/26]
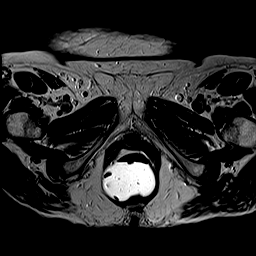
[im 26/26]
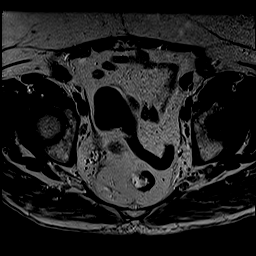

[Series 7: t2_tse axial strain · axial · 4.0mm · 0.98mm/px · z∈[-5,+120]mm · 3 of 26 slices shown]
[im 1/26]
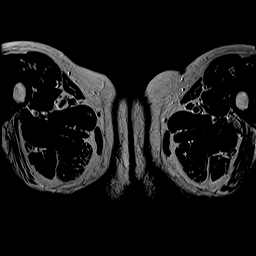
[im 13/26]
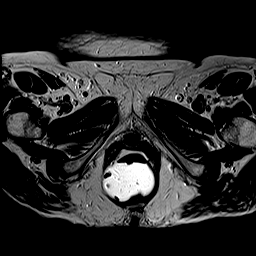
[im 26/26]
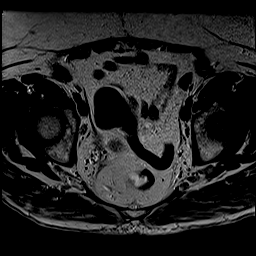

[Series 8: t2_tse sag rest · sagittal · 4.0mm · 0.98mm/px · 1 of 10 slices shown]
[im 1/10]
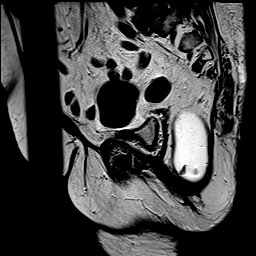

[Series 9: t2_tse sag strain · sagittal · 4.0mm · 0.98mm/px · 1 of 10 slices shown]
[im 1/10]
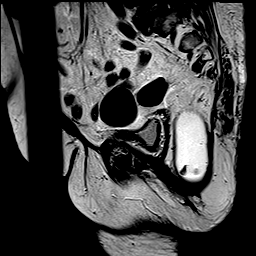

[Series 10: T2 · sagittal · 8.0mm · 0.66mm/px · 8 of 70 slices shown (1 of 2)]
[im 1/70]
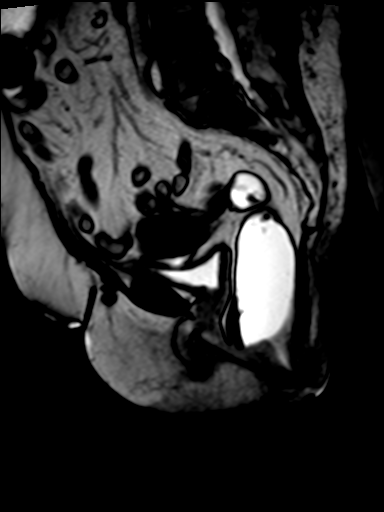
[im 10/70]
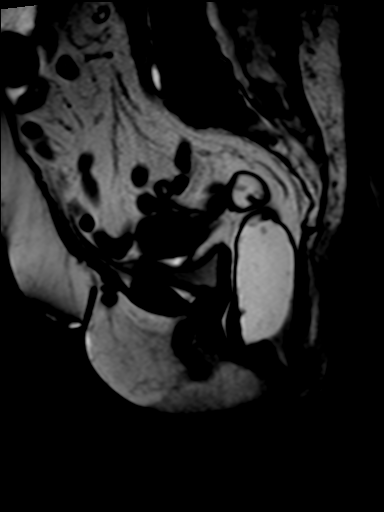
[im 20/70]
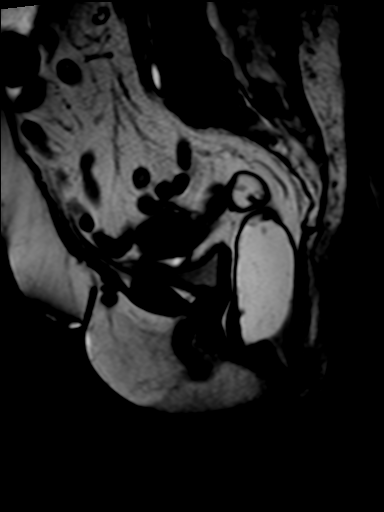
[im 30/70]
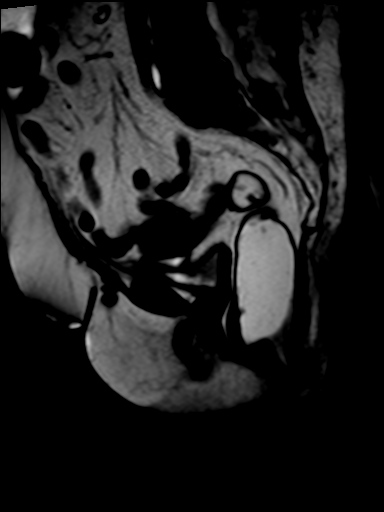
[im 40/70]
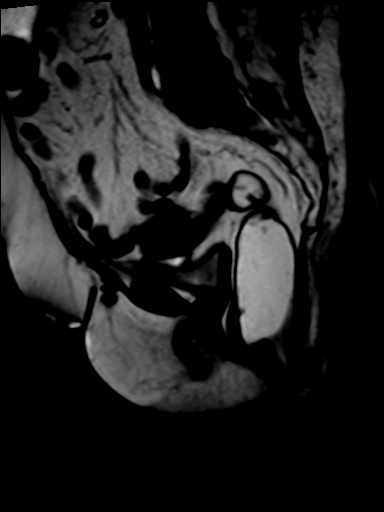
[im 50/70]
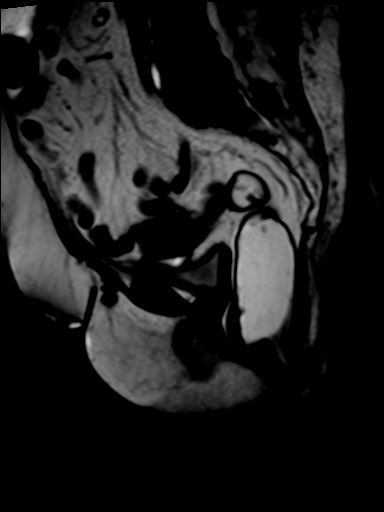
[im 60/70]
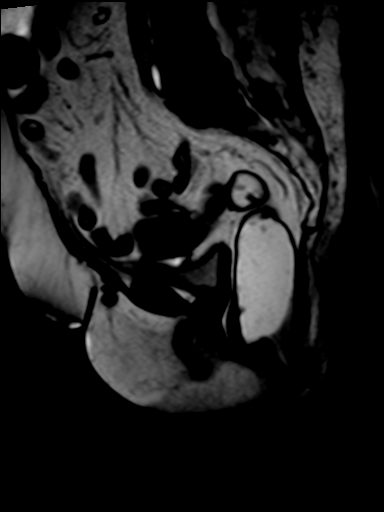
[im 70/70]
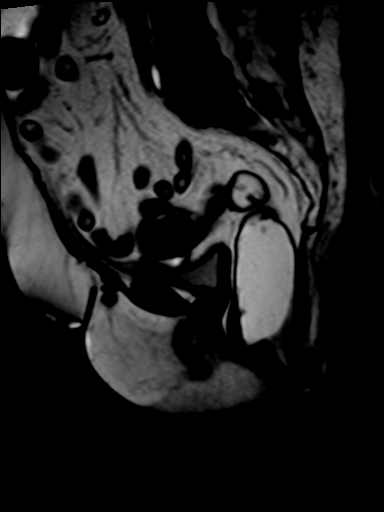

[Series 11: T2 · sagittal · 8.0mm · 0.66mm/px · 8 of 70 slices shown (2 of 2)]
[im 1/70]
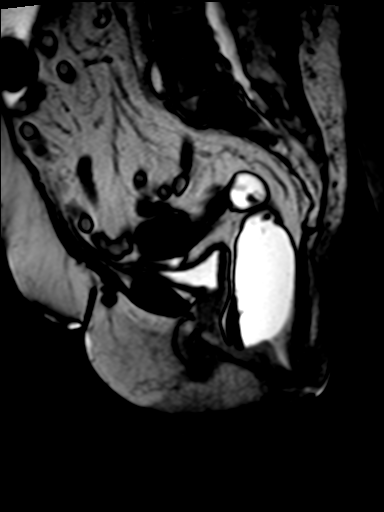
[im 10/70]
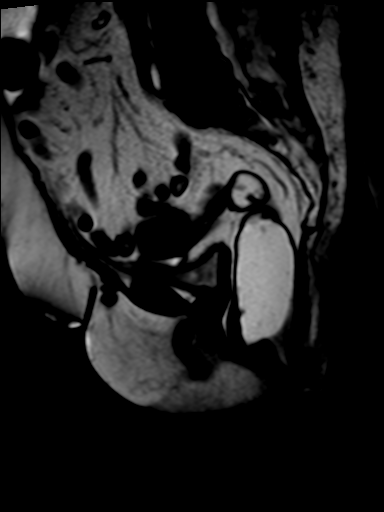
[im 20/70]
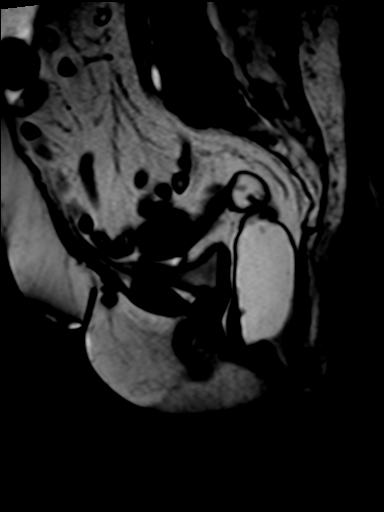
[im 30/70]
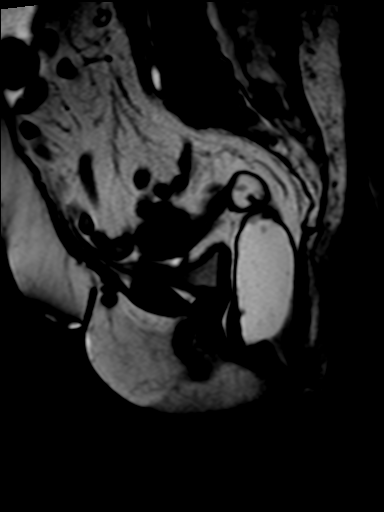
[im 40/70]
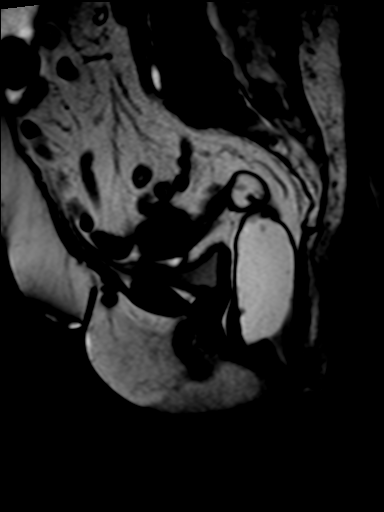
[im 50/70]
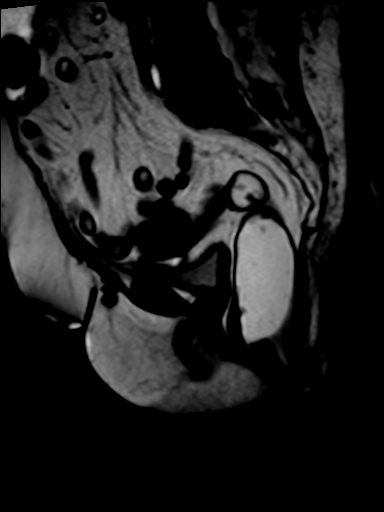
[im 60/70]
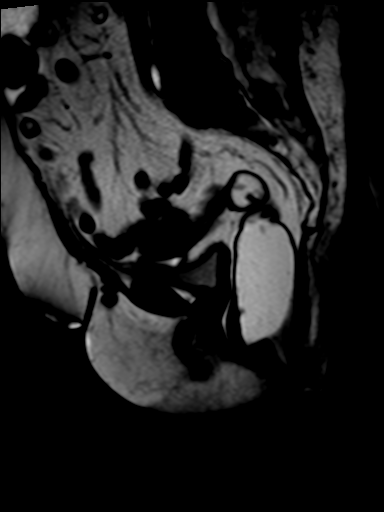
[im 70/70]
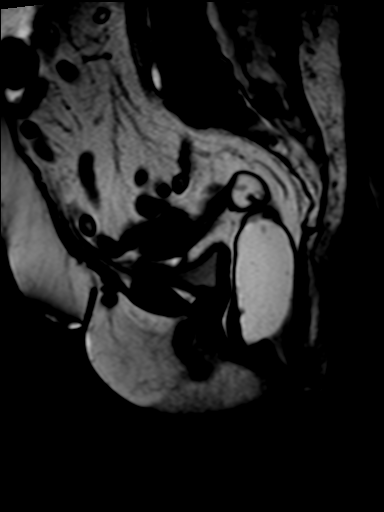

[32 of 48 positions shown; findings below may reference images not displayed]

FINDINGS: At rest, the levator ani musculature shows mild diffuse thinning and
mild concave downward contour bilaterally consistent with laxity.

The following measurements are obtained at rest:

H line:  5.6 cm cm

M line: 4.7 cm

Urinary bladder base:  0.2 cm above the pubococcygeal line

Vaginal apex:  1.9 cm above the pubococcygeal line

Levator plate angle:  45 degrees

The following measurements are obtained during maximum
strain/defectation:

H line:  6.5 cm

M line:  6.0 cm

Urinary bladder base:  1.9 cm below the pubococcygeal line

Vaginal apex:  0.1 cm above the pubococcygeal line

Levator plate angle:  60 degrees

Other findings during maximum strain/defecation: A cystocele is
present, with bladder base approximately 1.9 cm below the
pubococcygeal line.

Prior hysterectomy noted. Decent of the vaginal apex is also seen to
the level of the pubococcygeal line.

A moderate size rectocele is seen which extends 2.9 cm anterior to
the anal canal. There is poor emptying of the entire rectum as well
as the rectocele during evacuation.
IMPRESSION: Resting and dynamic pelvic floor laxity with involvement of
anterior, middle and posterior compartments, including a cystocele,
mild vaginal cuff prolapse, and rectocele. There is poor emptying of
the rectocele as well as the entire rectum during evacuation.

## 2016-10-03 ENCOUNTER — Telehealth: Payer: Self-pay | Admitting: Family Medicine

## 2016-10-03 DIAGNOSIS — I69354 Hemiplegia and hemiparesis following cerebral infarction affecting left non-dominant side: Secondary | ICD-10-CM | POA: Diagnosis not present

## 2016-10-03 DIAGNOSIS — I110 Hypertensive heart disease with heart failure: Secondary | ICD-10-CM | POA: Diagnosis not present

## 2016-10-03 DIAGNOSIS — Z9181 History of falling: Secondary | ICD-10-CM | POA: Diagnosis not present

## 2016-10-03 DIAGNOSIS — G40909 Epilepsy, unspecified, not intractable, without status epilepticus: Secondary | ICD-10-CM | POA: Diagnosis not present

## 2016-10-03 DIAGNOSIS — J449 Chronic obstructive pulmonary disease, unspecified: Secondary | ICD-10-CM | POA: Diagnosis not present

## 2016-10-03 DIAGNOSIS — I509 Heart failure, unspecified: Secondary | ICD-10-CM | POA: Diagnosis not present

## 2016-10-03 DIAGNOSIS — F17210 Nicotine dependence, cigarettes, uncomplicated: Secondary | ICD-10-CM | POA: Diagnosis not present

## 2016-10-03 DIAGNOSIS — E119 Type 2 diabetes mellitus without complications: Secondary | ICD-10-CM | POA: Diagnosis not present

## 2016-10-03 DIAGNOSIS — F250 Schizoaffective disorder, bipolar type: Secondary | ICD-10-CM | POA: Diagnosis not present

## 2016-10-03 NOTE — Telephone Encounter (Signed)
New Message  Pt left message on nurse line wanting to speak to a nurse.  Please f/u

## 2016-10-05 ENCOUNTER — Telehealth: Payer: Self-pay | Admitting: Internal Medicine

## 2016-10-05 ENCOUNTER — Ambulatory Visit: Payer: Self-pay

## 2016-10-05 ENCOUNTER — Other Ambulatory Visit: Payer: Self-pay | Admitting: Internal Medicine

## 2016-10-05 ENCOUNTER — Telehealth: Payer: Self-pay | Admitting: Family Medicine

## 2016-10-05 DIAGNOSIS — F17210 Nicotine dependence, cigarettes, uncomplicated: Secondary | ICD-10-CM | POA: Diagnosis not present

## 2016-10-05 DIAGNOSIS — J449 Chronic obstructive pulmonary disease, unspecified: Secondary | ICD-10-CM | POA: Diagnosis not present

## 2016-10-05 DIAGNOSIS — E042 Nontoxic multinodular goiter: Secondary | ICD-10-CM

## 2016-10-05 DIAGNOSIS — E119 Type 2 diabetes mellitus without complications: Secondary | ICD-10-CM | POA: Diagnosis not present

## 2016-10-05 DIAGNOSIS — G40909 Epilepsy, unspecified, not intractable, without status epilepticus: Secondary | ICD-10-CM | POA: Diagnosis not present

## 2016-10-05 DIAGNOSIS — I110 Hypertensive heart disease with heart failure: Secondary | ICD-10-CM | POA: Diagnosis not present

## 2016-10-05 DIAGNOSIS — I509 Heart failure, unspecified: Secondary | ICD-10-CM | POA: Diagnosis not present

## 2016-10-05 DIAGNOSIS — F250 Schizoaffective disorder, bipolar type: Secondary | ICD-10-CM | POA: Diagnosis not present

## 2016-10-05 DIAGNOSIS — Z9181 History of falling: Secondary | ICD-10-CM | POA: Diagnosis not present

## 2016-10-05 DIAGNOSIS — I69354 Hemiplegia and hemiparesis following cerebral infarction affecting left non-dominant side: Secondary | ICD-10-CM | POA: Diagnosis not present

## 2016-10-05 DIAGNOSIS — M79609 Pain in unspecified limb: Secondary | ICD-10-CM | POA: Diagnosis not present

## 2016-10-05 NOTE — Telephone Encounter (Signed)
Called patient and she wanted to be set up for ultrasound of her thyroid, because of the recent labs. Please advise if okay to order. Patient was concerned with her goiter as it was mentioned to have surgery on that and now she just would like to make sure there is nothing else.  Thank you!

## 2016-10-05 NOTE — Telephone Encounter (Signed)
Patient would like a call back asap and does not wih to disclose why.  Thank you,  -LL

## 2016-10-05 NOTE — Telephone Encounter (Signed)
Kim from [facility] called and left message on nurse line regarding patient. She states she was just leaving the patient's home and wanted to inform Dr Moshe Cipro that patient is not taking meds or cbg's as directed. She states the patient is still c/o constipation but abdomen is soft, not distended and bowel sounds are present and regular.

## 2016-10-05 NOTE — Telephone Encounter (Signed)
I ordered it

## 2016-10-06 ENCOUNTER — Telehealth: Payer: Self-pay

## 2016-10-06 ENCOUNTER — Telehealth: Payer: Self-pay | Admitting: Family Medicine

## 2016-10-06 NOTE — Telephone Encounter (Signed)
Called and notified patient that the [name] was ordered and they would be contacting her soon to schedule an appointment.

## 2016-10-06 NOTE — Telephone Encounter (Signed)
I recommend and please give verbal order for in home OT / PT for evaluation and management of repeat falls twoice weekly for 4 weeks I, pls call and let Ms Spayd know I recommend this because I am concerned, ensure she agrees to referral before referring

## 2016-10-06 NOTE — Telephone Encounter (Signed)
Patient left message on nurse line. She states she needs to speak with Dr Moshe Cipro asap, she has things she will discuss only with her.

## 2016-10-06 NOTE — Telephone Encounter (Signed)
noted 

## 2016-10-06 NOTE — Telephone Encounter (Signed)
[name] from [facility] called and left message on nurse line regarding patient. She states she saw patient this morning and the patient had 2 falls this morning. The first was getting into the tub, which the patient was not able to do. The second, the patient states she reached down to get something and lost her balance and fell. She denied any pain or head injury. [name] states that patient c/o head pain 6 weeks ago, but denied it today.  Callback for [name] is [phone]

## 2016-10-09 ENCOUNTER — Ambulatory Visit: Payer: Self-pay

## 2016-10-09 ENCOUNTER — Telehealth: Payer: Self-pay | Admitting: Internal Medicine

## 2016-10-09 ENCOUNTER — Telehealth: Payer: Self-pay | Admitting: Family Medicine

## 2016-10-09 NOTE — Telephone Encounter (Signed)
[facility] called in reference to patient with appointment on 10/12/16  fax needs to be sent stating patient can not reschedule in order for patient to attend appointment    Fax [phone]

## 2016-10-09 NOTE — Telephone Encounter (Signed)
Routing to you °

## 2016-10-09 NOTE — Telephone Encounter (Signed)
Called [name] regarding this. Not sure what we are suppose to do. Left call back number.

## 2016-10-09 NOTE — Telephone Encounter (Signed)
Patient called back today to speak with Dr Moshe Cipro, patient states she needs a new mattress hers is wore out. Please advise

## 2016-10-09 NOTE — Telephone Encounter (Signed)
Patient called to cancel the appt with Albina Billet today because she is unable to find transportation.  She also would like to let [name] know that she is having to "run to the bathroom a lot".

## 2016-10-10 ENCOUNTER — Telehealth: Payer: Self-pay | Admitting: Family Medicine

## 2016-10-10 ENCOUNTER — Emergency Department (HOSPITAL_COMMUNITY): Payer: Medicare HMO

## 2016-10-10 ENCOUNTER — Encounter (HOSPITAL_COMMUNITY): Payer: Self-pay | Admitting: Emergency Medicine

## 2016-10-10 ENCOUNTER — Telehealth: Payer: Self-pay | Admitting: *Deleted

## 2016-10-10 ENCOUNTER — Emergency Department (HOSPITAL_COMMUNITY)
Admission: EM | Admit: 2016-10-10 | Discharge: 2016-10-10 | Disposition: A | Discharge status: Home / Self Care | Payer: Medicare HMO | Attending: Emergency Medicine | Admitting: Emergency Medicine

## 2016-10-10 ENCOUNTER — Telehealth: Payer: Self-pay | Admitting: Internal Medicine

## 2016-10-10 DIAGNOSIS — F17210 Nicotine dependence, cigarettes, uncomplicated: Secondary | ICD-10-CM | POA: Diagnosis not present

## 2016-10-10 DIAGNOSIS — C039 Malignant neoplasm of gum, unspecified: Secondary | ICD-10-CM | POA: Diagnosis not present

## 2016-10-10 DIAGNOSIS — R140 Abdominal distension (gaseous): Secondary | ICD-10-CM | POA: Insufficient documentation

## 2016-10-10 DIAGNOSIS — F250 Schizoaffective disorder, bipolar type: Secondary | ICD-10-CM | POA: Diagnosis not present

## 2016-10-10 DIAGNOSIS — E039 Hypothyroidism, unspecified: Secondary | ICD-10-CM | POA: Insufficient documentation

## 2016-10-10 DIAGNOSIS — R112 Nausea with vomiting, unspecified: Secondary | ICD-10-CM | POA: Insufficient documentation

## 2016-10-10 DIAGNOSIS — I110 Hypertensive heart disease with heart failure: Secondary | ICD-10-CM | POA: Diagnosis not present

## 2016-10-10 DIAGNOSIS — J449 Chronic obstructive pulmonary disease, unspecified: Secondary | ICD-10-CM | POA: Diagnosis not present

## 2016-10-10 DIAGNOSIS — E119 Type 2 diabetes mellitus without complications: Secondary | ICD-10-CM | POA: Insufficient documentation

## 2016-10-10 DIAGNOSIS — I10 Essential (primary) hypertension: Secondary | ICD-10-CM | POA: Insufficient documentation

## 2016-10-10 DIAGNOSIS — Z79899 Other long term (current) drug therapy: Secondary | ICD-10-CM | POA: Diagnosis not present

## 2016-10-10 DIAGNOSIS — G40909 Epilepsy, unspecified, not intractable, without status epilepticus: Secondary | ICD-10-CM | POA: Diagnosis not present

## 2016-10-10 DIAGNOSIS — R109 Unspecified abdominal pain: Secondary | ICD-10-CM | POA: Insufficient documentation

## 2016-10-10 DIAGNOSIS — Z794 Long term (current) use of insulin: Secondary | ICD-10-CM | POA: Insufficient documentation

## 2016-10-10 DIAGNOSIS — I69354 Hemiplegia and hemiparesis following cerebral infarction affecting left non-dominant side: Secondary | ICD-10-CM | POA: Diagnosis not present

## 2016-10-10 DIAGNOSIS — Z9181 History of falling: Secondary | ICD-10-CM | POA: Diagnosis not present

## 2016-10-10 DIAGNOSIS — I509 Heart failure, unspecified: Secondary | ICD-10-CM | POA: Diagnosis not present

## 2016-10-10 DIAGNOSIS — R1084 Generalized abdominal pain: Secondary | ICD-10-CM | POA: Diagnosis not present

## 2016-10-10 LAB — URINALYSIS, ROUTINE W REFLEX MICROSCOPIC
BILIRUBIN URINE: NEGATIVE
Bacteria, UA: NONE SEEN
GLUCOSE, UA: 50 mg/dL — AB
Hgb urine dipstick: NEGATIVE
KETONES UR: NEGATIVE mg/dL
LEUKOCYTES UA: NEGATIVE
NITRITE: NEGATIVE
PH: 6 (ref 5.0–8.0)
Protein, ur: 30 mg/dL — AB
SPECIFIC GRAVITY, URINE: 1.03 (ref 1.005–1.030)

## 2016-10-10 LAB — CBC WITH DIFFERENTIAL/PLATELET
BASOS PCT: 0 %
Basophils Absolute: 0 10*3/uL (ref 0.0–0.1)
EOS ABS: 0.2 10*3/uL (ref 0.0–0.7)
Eosinophils Relative: 3 %
HEMATOCRIT: 40.7 % (ref 36.0–46.0)
HEMOGLOBIN: 12.3 g/dL (ref 12.0–15.0)
LYMPHS ABS: 1.9 10*3/uL (ref 0.7–4.0)
Lymphocytes Relative: 30 %
MCH: 22.8 pg — AB (ref 26.0–34.0)
MCHC: 30.2 g/dL (ref 30.0–36.0)
MCV: 75.4 fL — ABNORMAL LOW (ref 78.0–100.0)
MONO ABS: 0.5 10*3/uL (ref 0.1–1.0)
MONOS PCT: 7 %
NEUTROS PCT: 60 %
Neutro Abs: 3.8 10*3/uL (ref 1.7–7.7)
Platelets: 246 10*3/uL (ref 150–400)
RBC: 5.4 MIL/uL — ABNORMAL HIGH (ref 3.87–5.11)
RDW: 14.7 % (ref 11.5–15.5)
WBC: 6.4 10*3/uL (ref 4.0–10.5)

## 2016-10-10 LAB — COMPREHENSIVE METABOLIC PANEL
ALK PHOS: 151 U/L — AB (ref 38–126)
ALT: 29 U/L (ref 14–54)
ANION GAP: 11 (ref 5–15)
AST: 36 U/L (ref 15–41)
Albumin: 4.4 g/dL (ref 3.5–5.0)
BUN: 14 mg/dL (ref 6–20)
CALCIUM: 9.9 mg/dL (ref 8.9–10.3)
CO2: 23 mmol/L (ref 22–32)
CREATININE: 0.85 mg/dL (ref 0.44–1.00)
Chloride: 112 mmol/L — ABNORMAL HIGH (ref 101–111)
Glucose, Bld: 212 mg/dL — ABNORMAL HIGH (ref 65–99)
Potassium: 3.5 mmol/L (ref 3.5–5.1)
SODIUM: 146 mmol/L — AB (ref 135–145)
TOTAL PROTEIN: 8.6 g/dL — AB (ref 6.5–8.1)
Total Bilirubin: 0.3 mg/dL (ref 0.3–1.2)

## 2016-10-10 LAB — LIPASE, BLOOD: LIPASE: 25 U/L (ref 11–51)

## 2016-10-10 MED ORDER — METOCLOPRAMIDE HCL 5 MG/ML IJ SOLN
10.0000 mg | Freq: Once | INTRAMUSCULAR | Status: AC
  Administered 2016-10-10: 10 mg via INTRAVENOUS
  Filled 2016-10-10: qty 2

## 2016-10-10 MED ORDER — IOPAMIDOL (ISOVUE-300) INJECTION 61%
100.0000 mL | Freq: Once | INTRAVENOUS | Status: AC | PRN
  Administered 2016-10-10: 100 mL via INTRAVENOUS

## 2016-10-10 MED ORDER — ONDANSETRON HCL 4 MG/2ML IJ SOLN
4.0000 mg | Freq: Once | INTRAMUSCULAR | Status: AC
  Administered 2016-10-10: 4 mg via INTRAVENOUS
  Filled 2016-10-10: qty 2

## 2016-10-10 MED ORDER — ONDANSETRON 8 MG PO TBDP: 8.0000 mg | ORAL_TABLET | Freq: Three times a day (TID) | ORAL | 0 refills | Status: DC | PRN

## 2016-10-10 MED ORDER — MORPHINE SULFATE (PF) 4 MG/ML IV SOLN
4.0000 mg | Freq: Once | INTRAVENOUS | Status: AC
  Administered 2016-10-10: 4 mg via INTRAVENOUS
  Filled 2016-10-10: qty 1

## 2016-10-10 NOTE — Telephone Encounter (Signed)
What do I need to do?

## 2016-10-10 NOTE — Telephone Encounter (Signed)
Is patient supposed to stop taking Aciphex? Please advise.  Thank you,  -LL

## 2016-10-10 NOTE — Telephone Encounter (Signed)
I am not sure why she is asking this question... I know she has a thyroid ultrasound coming up and she does not have to stop it for that, But I am not sure if this is the scope of her question.

## 2016-10-10 NOTE — ED Provider Notes (Signed)
[facility] DEPT Provider Note   CSN: [id number] Arrival date & time: 10/10/16  1801     History   Chief Complaint Chief Complaint  Patient presents with  . Abdominal Pain    HPI [name] is a 66 y.o. female.  HPI Pt reports 24 hours of increasing abdominal pain and nausea without vomiting. No fevers or chills. No diarrhea. Hx of recent SBO treated medically with NG tube. Pain is moderate in severity. No other complaints at this time   Past Medical History:  Diagnosis Date  . Anemia   . Anxiety    takes Ativan daily  . Arthritis   . Bipolar disorder ([place])    takes Risperdal nightly  . Blood transfusion   . Cancer ([place])    In her gum  . Carpal tunnel syndrome of right wrist 05/23/2011  . Cervical disc disorder with radiculopathy of cervical region 10/31/2012  . Chronic back pain   . Chronic idiopathic constipation   . Colon polyps   . COPD (chronic obstructive pulmonary disease) with chronic bronchitis ([place]) 09/16/2013   Office Spirometry 10/30/2013-submaximal effort based on appearance of loop and curve. Numbers would fit with severe restriction but her physiologic capability may be better than this. FVC 0.91/44%, and 10.74/45%, FEV1/FVC 0.81, FEF 25-75% 1.43/69%    . Depression    takes Zoloft daily  . Diabetes mellitus    Type II  . Diverticulosis    TCS 9/08 by Dr. Delfin Edis for diarrhea . Bx for micro scopic colitis negative.   . Fibromyalgia   . Frequent falls   . GERD (gastroesophageal reflux disease)    takes Aciphex daily  . Glaucoma    eye drops daily  . Gum symptoms    infection on antibiotic  . Hemiplegia affecting non-dominant side, post-stroke 08/02/2011  . Hyperlipidemia    takes Crestor daily  . Hypertension    takes Amlodipine,Metoprolol,and Clonidine daily  . Hypothyroidism    takes Synthroid daily  . IBS (irritable bowel syndrome)   . Insomnia    takes Trazodone nightly  . Metabolic encephalopathy 1/54/0086  . Migraines    chronic headaches  . Mononeuritis lower limb   . Osteoporosis   . Pancreatitis 2006   due to Depakote with normal EUS   . Schatzki's ring    non critical / EGD with ED 8/2011with RMR  . Seizures ([place])    takes Lamictal daily.Last seizure 3 yrs ago  . Sleep apnea    on CPAP  . Stroke [facility])    left sided weakness  . Tubular adenoma of colon     Patient Active Problem List   Diagnosis Date Noted  . SBO (small bowel obstruction) ([place]) 09/10/2016  . Diabetic polyneuropathy associated with type 2 diabetes mellitus ([place]) 05/19/2016  . Smoker 04/24/2016  . Class 1 obesity with serious comorbidity and body mass index (BMI) of 33.0 to 33.9 in adult 04/24/2016  . Ulcer of toe, left, limited to breakdown of skin ([place]) 04/18/2016  . History of palpitations 08/09/2015  . Nausea without vomiting 08/09/2015  . Labile hypertension 08/03/2015  . Normal coronary arteries 08/03/2015  . Left-sided low back pain with left-sided sciatica 06/27/2015  . Left flank pain 06/27/2015  . Acute cystitis without hematuria 05/10/2015  . Poorly controlled type 2 diabetes mellitus with circulatory disorder ([place]) 05/06/2015  . Multinodular goiter 05/06/2015  . Rectocele, female 04/27/2015  . Anal sphincter incontinence 04/27/2015  . Pelvic relaxation due to rectocele 03/30/2015  .  Hospital discharge follow-up 03/02/2015  . Pulmonary hypertension ([place]) 02/22/2015  . Weakness of left upper extremity 02/21/2015  . Light cigarette smoker (1-9 cigarettes per day) 01/11/2015  . Migraine without aura and without status migrainosus, not intractable 07/02/2014  . Flatulence 02/18/2014  . Microcytic anemia 02/18/2014  . COPD (chronic obstructive pulmonary disease) with chronic bronchitis ([place]) 09/16/2013  . Hypothyroidism 08/16/2013  . Gastroparesis 04/28/2013  . Seizure disorder ([place]) 01/19/2013  . Cervical disc disorder with radiculopathy of cervical region 10/31/2012  . Solitary pulmonary nodule 08/19/2012  .  Anemia 07/05/2012  . 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 Allergic sinusitis 04/18/2012  . Meningioma ([place]) 11/19/2011  . 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 Hemiplegia affecting non-dominant side, post-stroke 08/02/2011  . Carpal tunnel syndrome of right wrist 05/23/2011  . Polypharmacy 04/28/2011  . Bipolar disorder ([place]) 04/28/2011  . Constipation 04/13/2011  . Falls frequently 12/12/2010  . Urinary incontinence 12/16/2009  . HEARING LOSS 10/26/2009  . Hyperlipidemia 12/11/2008  . IBS 12/11/2008  . GERD 07/29/2008  . MILK PRODUCTS ALLERGY 07/29/2008  . 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 Backache 06/19/2007  . Osteoporosis 06/19/2007  . Obstructive sleep apnea 06/19/2007  . TRIGGER FINGER 04/18/2007  . DIVERTICULOSIS, COLON 11/13/2006    Past Surgical History:  Procedure Laterality Date  . ABDOMINAL HYSTERECTOMY  1978  . BACK SURGERY  July 2012  . BACTERIAL OVERGROWTH TEST N/A 05/05/2013   Procedure: BACTERIAL OVERGROWTH TEST;  Surgeon: Daneil Dolin, MD;  Location: [facility];  Service: Endoscopy;  Laterality: N/A;  7:30  . BIOPSY THYROID  2009  . BRAIN SURGERY  11/2011   resection of meningioma  . BREAST REDUCTION SURGERY  1994  . CARDIAC CATHETERIZATION  05/10/2005   normal coronaries, normal LV systolic function and EF (Dr. Jackie Plum)  . CARPAL TUNNEL RELEASE Left 07/22/04   Dr. Aline Brochure  . CATARACT EXTRACTION Bilateral   . CHOLECYSTECTOMY  1984  . COLONOSCOPY N/A 09/25/2012   WVP:XTGGYIR diverticulosis.  colonic polyp-removed : tubular adenoma  . CRANIOTOMY  11/23/2011   Procedure: CRANIOTOMY TUMOR EXCISION;  Surgeon: Hosie Spangle, MD;  Location: [facility]URO ORS;  Service: Neurosurgery;  Laterality: N/A;  Craniotomy for tumor resection  . ESOPHAGOGASTRODUODENOSCOPY  12/29/2010   Rourk-Retained food in the esophagus and stomach, small hiatal hernia, status post Maloney dilation of the esophagus    . ESOPHAGOGASTRODUODENOSCOPY N/A 09/25/2012   SWN:IOEVOJJK atonic baggy esophagus status post Maloney dilation 5 F. Hiatal hernia  . GIVENS CAPSULE STUDY N/A 01/15/2013   NORMAL.   . IR GENERIC HISTORICAL  03/17/2016   IR RADIOLOGIST EVAL & MGMT 03/17/2016 [facility]-INTERV RAD  . LESION REMOVAL N/A 05/31/2015   Procedure: REMOVAL RIGHT AND LEFT LESIONS OF MANDIBLE;  Surgeon: Diona Browner, DDS;  Location: [facility];  Service: Oral Surgery;  Laterality: N/A;  . MALONEY DILATION  12/29/2010   RMR;  . NM MYOCAR PERF WALL MOTION  2006   "relavtiely normal" persantine, mild anterior thinning (breast attenuation artifact), no region of scar/ischemia  . OVARIAN CYST REMOVAL    . RECTOCELE REPAIR N/A 06/29/2015   Procedure: POSTERIOR REPAIR (RECTOCELE);  Surgeon: Jonnie Kind, MD;  Location: [facility] ORS;  Service: Gynecology;  Laterality: N/A;  . REDUCTION MAMMAPLASTY Bilateral   . SPINE SURGERY  09/29/2010   Dr. Rolena Infante  . surgical excision of 3 tumors from right thigh and right buttock  and left upper thigh  2010  .  TOOTH EXTRACTION Bilateral 12/14/2014   Procedure: REMOVAL OF BILATERAL MANDIBULAR EXOSTOSES;  Surgeon: Diona Browner, DDS;  Location: [facility];  Service: Oral Surgery;  Laterality: Bilateral;  . TRANSTHORACIC ECHOCARDIOGRAM  2010   EF 60-65%, mild conc LVH, grade 1 diastolic dysfunction; mildly calcified MV annulus with mildly thickened leaflets, mildly calcified MR annulus    OB History    Gravida Para Term Preterm AB Living   6 1 1   5      SAB TAB Ectopic Multiple Live Births   5               Home Medications    Prior to Admission medications   Medication Sig Start Date End Date Taking? Authorizing Provider  ACCU-CHEK FASTCLIX LANCETS MISC Use to test blood sugar 4 times daily. Dx: E10.65 12/03/14   Philemon Kingdom, MD  ACCU-CHEK SMARTVIEW test strip TEST BLOOD SUGAR FOUR TIMES DAILY AS DIRECTED 09/19/16   Philemon Kingdom, MD  albuterol (PROVENTIL HFA;VENTOLIN HFA) 108 (90 Base) MCG/ACT  inhaler Inhale 1-2 puffs every 6 hours as needed for wheezing, shortness of breath 07/27/16   Baird Lyons D, MD  Alcohol Swabs (B-D SINGLE USE SWABS REGULAR) PADS Use for injections and glucose testing 8 times daily. Dx: E10.65 12/03/14   Philemon Kingdom, MD  amLODipine (NORVASC) 5 MG tablet TAKE 2 TABLETS BY MOUTH ONCE DAILY. 09/25/16   Philemon Kingdom, MD  aspirin EC 81 MG tablet Take 81 mg by mouth daily.    [provider]  Azelastine-Fluticasone (DYMISTA) 137-50 MCG/ACT SUSP Place 1 puff into the nose at bedtime. 07/27/15   Deneise Lever, MD  BD PEN NEEDLE NANO U/F 32G X 4 MM MISC USE FOUR TIMES DAILY 04/20/15   Philemon Kingdom, MD  Blood Glucose Calibration (ACCU-CHEK SMARTVIEW CONTROL) LIQD 1 each by Other route as needed. 12/03/14   Philemon Kingdom, MD  Blood Glucose Monitoring Suppl (ACCU-CHEK NANO SMARTVIEW) W/DEVICE KIT 1 each by Does not apply route daily. Dx: E10.65 Patient not taking: Reported on 09/27/2016 12/03/14   Philemon Kingdom, MD  Cholecalciferol (VITAMIN D) 2000 units CAPS Take 1 capsule by mouth daily.    [provider]  clobetasol cream (TEMOVATE) 1.61 % Apply 1 application topically daily.    [provider]  cloNIDine (CATAPRES) 0.3 MG tablet TAKE 1 TABLET BY MOUTH EVERY EIGHT HOURS. ONCE AT 8AM, 4PM, AND 12 MIDNIGHT. 08/30/16   [name], MD  Coenzyme Q-10 100 MG capsule Take 1 capsule (100 mg total) by mouth 3 (three) times daily. 08/09/16   Pieter Partridge, DO  diclofenac sodium (VOLTAREN) 1 % GEL Apply 2 g topically daily as needed (Pain). 07/21/16   [name], MD  hydroxychloroquine (PLAQUENIL) 200 MG tablet Take 200 mg by mouth daily.    [provider]  Insulin Glargine (TOUJEO SOLOSTAR) 300 UNIT/ML SOPN Inject 25 Units into the skin at bedtime. 06/27/16   Philemon Kingdom, MD  lamoTRIgine (LAMICTAL) 100 MG tablet Take 1 tablet (100 mg total) by mouth 2 (two) times daily. 08/30/16   Pieter Partridge, DO    levothyroxine (SYNTHROID, LEVOTHROID) 50 MCG tablet TAKE 1 TABLET BY MOUTH ONCE DAILY AND 1/2 TABLET ON SUNDAYS. 05/02/16   Philemon Kingdom, MD  LORazepam (ATIVAN) 0.5 MG tablet Take 1 tablet (0.5 mg total) by mouth 3 (three) times daily. 09/27/16   Cloria Spring, MD  losartan (COZAAR) 50 MG tablet TAKE ONE TABLET BY MOUTH DAILY. 07/04/16   Tula Nakayama  E, MD  metoprolol tartrate (LOPRESSOR) 50 MG tablet Take 1 tablet (50 mg total) by mouth 2 (two) times daily. Please call and schedule appointment 09/26/16   Pixie Casino, MD  montelukast (SINGULAIR) 10 MG tablet TAKE ONE TABLET BY MOUTH ONCE DAILY. 08/30/16   [name], MD  Multiple Vitamins-Minerals (ONE-A-DAY 50 PLUS PO) Take 1 tablet by mouth daily.    [provider]  NOVOLOG FLEXPEN 100 UNIT/ML FlexPen INJECT 16 TO 30 UNITS INTO THE SKIN 3 (THREE) TIMES DAILY WITH MEALS. 01/06/16   Philemon Kingdom, MD  nystatin (MYCOSTATIN/NYSTOP) powder APPLY TO AFFECTED AREA FOUR TIMES DAILY. 04/03/16   [name], MD  pregabalin (LYRICA) 75 MG capsule Take 75 mg by mouth 2 (two) times daily.  10/26/11   Carole Civil, MD  RABEprazole (ACIPHEX) 20 MG tablet Take 1 tablet (20 mg total) by mouth 2 (two) times daily. 06/12/16   Pyrtle, Lajuan Lines, MD  RESTASIS 0.05 % ophthalmic emulsion Place 1 drop into both eyes 2 (two) times daily.  02/04/14   [provider]  Riboflavin 400 MG TABS Take 400 mg by mouth daily. Patient not taking: Reported on 09/27/2016 08/09/16   Pieter Partridge, DO  risperiDONE (RISPERDAL) 0.5 MG tablet Take 1 tablet (0.5 mg total) by mouth at bedtime. 09/27/16   Cloria Spring, MD  rosuvastatin (CRESTOR) 5 MG tablet TAKE 1 TABLET BY MOUTH AT BEDTIME. 08/30/16   [name], MD  sertraline (ZOLOFT) 100 MG tablet Take 2 tablets (200 mg total) by mouth daily. 09/27/16   Cloria Spring, MD  traZODone (DESYREL) 100 MG tablet Take 2 tablets (200 mg total) by mouth at bedtime. 09/27/16   Cloria Spring, MD  vitamin B-12 (CYANOCOBALAMIN) 1000 MCG tablet Take 1,000 mcg by mouth daily.    [provider]    Family History Family History  Problem Relation Age of Onset  . Heart attack Mother        HTN  . Pneumonia Father   . Kidney failure Father   . Diabetes Father   . Pancreatic cancer Sister   . Diabetes Brother   . Hypertension Brother   . Diabetes Brother   . Cancer Sister        breast   . Hypertension Son   . Sleep apnea Son   . Cancer Sister        pancreatic  . Stroke Maternal Grandmother   . Heart attack Maternal Grandfather   . Alcohol abuse Maternal Uncle   . Colon cancer Neg Hx   . Anesthesia problems Neg Hx   . Hypotension Neg Hx   . Malignant hyperthermia Neg Hx   . Pseudochol deficiency Neg Hx     Social History Social History  Substance Use Topics  . Smoking status: Light Tobacco Smoker    Packs/day: 0.25    Years: 7.00    Types: Cigarettes  . Smokeless tobacco: Never Used     Comment: continues to smoke 1/4 pack a day   . Alcohol use No     Comment:       Allergies   Cephalexin; Iron; Milk-related compounds; Penicillins; and Phenazopyridine hcl   Review of Systems Review of Systems  All other systems reviewed and are negative.    Physical Exam Updated Vital Signs BP (!) 208/74   Pulse 91   Temp 98.8 F (37.1 C) (Oral)   Resp 19   SpO2 98%  Physical Exam  Constitutional: She is oriented to person, place, and time. She appears well-developed and well-nourished. No distress.  HENT:  Head: Normocephalic and atraumatic.  Eyes: EOM are normal.  Neck: Normal range of motion.  Cardiovascular: Normal rate, regular rhythm and normal heart sounds.   Pulmonary/Chest: Effort normal and breath sounds normal.  Abdominal: Soft. She exhibits distension. There is tenderness.  Generalized tenderness  Musculoskeletal: Normal range of motion.  Neurological: She is alert and oriented to person, place, and time.  Skin: Skin is warm and  dry.  Psychiatric: She has a normal mood and affect. Judgment normal.  Nursing note and vitals reviewed.    ED Treatments / Results  Labs (all labs ordered are listed, but only abnormal results are displayed) Labs Reviewed  COMPREHENSIVE METABOLIC PANEL - Abnormal; Notable for the following:       Result Value   Sodium 146 (*)    Chloride 112 (*)    Glucose, Bld 212 (*)    Total Protein 8.6 (*)    Alkaline Phosphatase 151 (*)    All other components within normal limits  URINALYSIS, ROUTINE W REFLEX MICROSCOPIC - Abnormal; Notable for the following:    Glucose, UA 50 (*)    Protein, ur 30 (*)    Squamous Epithelial / LPF 0-5 (*)    All other components within normal limits  CBC WITH DIFFERENTIAL/PLATELET - Abnormal; Notable for the following:    RBC 5.40 (*)    MCV 75.4 (*)    MCH 22.8 (*)    All other components within normal limits  LIPASE, BLOOD    EKG  EKG Interpretation None       Radiology Ct Abdomen Pelvis W Contrast  Result Date: 10/10/2016 CLINICAL DATA:  66 year old female with small bowel obstruction earlier this month. Generalized abdominal pain and nausea. Diabetes. EXAM: CT ABDOMEN AND PELVIS WITH CONTRAST TECHNIQUE: Multidetector CT imaging of the abdomen and pelvis was performed using the standard protocol following bolus administration of intravenous contrast. CONTRAST:  144m ISOVUE-300 IOPAMIDOL (ISOVUE-300) INJECTION 61% COMPARISON:  CT Abdomen and Pelvis 09/15/2016, 09/10/2016, and earlier. FINDINGS: Lower chest: Stable and negative. No pericardial or pleural effusion. Hepatobiliary: Surgically absent gallbladder. Stable biliary ductal enlargement. Otherwise negative liver. Pancreas: Stable an negative. Spleen: Stable an negative. Adrenals/Urinary Tract: Normal adrenal glands. Bilateral renal enhancement and contrast excretion is symmetric and normal. Negative course of both ureters. Unremarkable urinary bladder. Stomach/Bowel: Decompressed rectum.  Sigmoid colon with mild diverticulosis, and retained stool. Similar diverticulosis and retained stool in the left colon and transverse colon. Mild diverticulosis also in the right colon. Normal appendix. Negative terminal ileum. No dilated or abnormal small bowel loops today. However, the stomach is moderately distended with fluid and food debris. There is an antral contraction of the stomach, but no obstructing etiology is identified. However, the duodenum appears stable and within normal limits. No abdominal free fluid. No free air. Vascular/Lymphatic: Aortoiliac calcified atherosclerosis. Major arterial structures in the abdomen and pelvis are patent. Portal venous system is patent. Reproductive: Surgically absent. Other: No pelvic free fluid. Probable injection sites in the left abdominal panniculus (such as series 2, image 74). Musculoskeletal: Stable. Advanced lower lumbar spine degeneration and postoperative changes to the lower lumbar posterior elements. IMPRESSION: 1. Moderately distended stomach with fluid and food debris, such that gastroparesis or gastric outlet obstruction are considerations, but there is no dilated small bowel or other bowel obstruction today. 2. Chronic findings including mild Calcified aortic atherosclerosis, and mild  diverticulosis of the colon. Electronically Signed   By: Genevie Ann M.D.   On: 10/10/2016 19:34    Procedures Procedures (including critical care time)  Medications Ordered in ED Medications  ondansetron (ZOFRAN) injection 4 mg (4 mg Intravenous Given 10/10/16 1848)  morphine 4 MG/ML injection 4 mg (4 mg Intravenous Given 10/10/16 1848)     Initial Impression / Assessment and Plan / ED Course  I have reviewed the triage vital signs and the nursing notes.  Pertinent labs & imaging results that were available during my care of the patient were reviewed by me and considered in my medical decision making (see chart for details).     8:31 PM Improvement in  symptoms. Offered oral trial but the patient does not want to try at this time. She states she feels better and would like to go home. Outpatient GI follow up recommended for possible outpatient EGD. Pt understands to return to the ER for new or worsening symptoms. Close pcp follow up  Final Clinical Impressions(s) / ED Diagnoses   Final diagnoses:  Abdominal pain, unspecified abdominal location  Nausea and vomiting, intractability of vomiting not specified, unspecified vomiting type    New Prescriptions New Prescriptions   No medications on file     Jola Schmidt, MD 10/10/16 2033

## 2016-10-10 NOTE — Telephone Encounter (Signed)
See below

## 2016-10-10 NOTE — Telephone Encounter (Signed)
Patient called back requesting Dr Moshe Cipro to call her at [phone]

## 2016-10-10 NOTE — Telephone Encounter (Signed)
The [employer] formulary is not available online and I have asked [employer] if they had that information but they have not gotten back to me. I would assume the generic patch or inhaler

## 2016-10-10 NOTE — Telephone Encounter (Signed)
Patient called requesting to talk to the nurse, patient asked if the nurse would call her back.

## 2016-10-10 NOTE — Telephone Encounter (Signed)
Patient calling because she spoke to Dr Moshe Cipro but because she was "half asleep" she forgot to tell her that she is having a difficult time using the bathroom and she feels nauseated.  She asks that we give this message to the nurse or to the doctor.

## 2016-10-10 NOTE — Telephone Encounter (Signed)
I spoke directly with the pt , she states her mattress is old and she  needs a new mattress for her hospital bed,was unable to tell me where the bed is from, [facility], was very drowsy, may need to f/u next Monday when she comes in ifyou are able to get the info on your own OPK to order new mattress for her bed, if it is too soon the ins will deny it I am sure

## 2016-10-10 NOTE — Telephone Encounter (Signed)
See below, Letter placed in paperwork on your desk. Thanks!

## 2016-10-10 NOTE — Telephone Encounter (Signed)
1 

## 2016-10-10 NOTE — Telephone Encounter (Signed)
Message sent to [name].

## 2016-10-10 NOTE — Telephone Encounter (Signed)
Submitted signed letter to given fax number.

## 2016-10-10 NOTE — Telephone Encounter (Signed)
Olin Hauser from [facility] called to advise that the patient is scheduled 8/2 at 1:55pm. Transportation wants a fax from Endocrinology stating that the appointment can not be rescheduled. Please advise.

## 2016-10-10 NOTE — Telephone Encounter (Signed)
Please advise. Thank you

## 2016-10-10 NOTE — ED Triage Notes (Signed)
Pt states was just dc July 7 for intestinal blockage that did not need surgery. Pt c/o all over abd pain again with nausea. Dry heaving per pt. Small bm this am. A/o

## 2016-10-11 ENCOUNTER — Telehealth: Payer: Self-pay

## 2016-10-11 DIAGNOSIS — Z794 Long term (current) use of insulin: Secondary | ICD-10-CM | POA: Diagnosis not present

## 2016-10-11 DIAGNOSIS — H16223 Keratoconjunctivitis sicca, not specified as Sjogren's, bilateral: Secondary | ICD-10-CM | POA: Diagnosis not present

## 2016-10-11 DIAGNOSIS — E119 Type 2 diabetes mellitus without complications: Secondary | ICD-10-CM | POA: Diagnosis not present

## 2016-10-11 DIAGNOSIS — R6889 Other general symptoms and signs: Secondary | ICD-10-CM | POA: Diagnosis not present

## 2016-10-11 DIAGNOSIS — Z7984 Long term (current) use of oral hypoglycemic drugs: Secondary | ICD-10-CM | POA: Diagnosis not present

## 2016-10-11 LAB — FERRITIN: Ferritin: 55 ng/mL (ref 15–150)

## 2016-10-11 LAB — IRON AND TIBC
IRON: 64 ug/dL (ref 27–139)
Iron Saturation: 18 % (ref 15–55)
TIBC: 360 ug/dL (ref 250–450)
UIBC: 296 ug/dL (ref 118–369)

## 2016-10-11 LAB — SPECIMEN STATUS REPORT

## 2016-10-11 LAB — B12 AND FOLATE PANEL
Folate: >20 ng/mL (ref >3.0)
Vitamin B-12: 1893 pg/mL — ABNORMAL HIGH (ref 232–1245)

## 2016-10-11 NOTE — Telephone Encounter (Signed)
Called patient and advised of MD note. Patient is confused on who prescribed this medication, I advised her GI had given her this and she will call them with the questions she has.

## 2016-10-12 ENCOUNTER — Telehealth: Payer: Self-pay

## 2016-10-12 ENCOUNTER — Ambulatory Visit
Admission: RE | Admit: 2016-10-12 | Discharge: 2016-10-12 | Disposition: A | Discharge status: Home / Self Care | Payer: Medicare HMO | Source: Ambulatory Visit | Attending: Internal Medicine | Admitting: Internal Medicine

## 2016-10-12 DIAGNOSIS — I69354 Hemiplegia and hemiparesis following cerebral infarction affecting left non-dominant side: Secondary | ICD-10-CM | POA: Diagnosis not present

## 2016-10-12 DIAGNOSIS — G40909 Epilepsy, unspecified, not intractable, without status epilepticus: Secondary | ICD-10-CM | POA: Diagnosis not present

## 2016-10-12 DIAGNOSIS — J449 Chronic obstructive pulmonary disease, unspecified: Secondary | ICD-10-CM | POA: Diagnosis not present

## 2016-10-12 DIAGNOSIS — F250 Schizoaffective disorder, bipolar type: Secondary | ICD-10-CM | POA: Diagnosis not present

## 2016-10-12 DIAGNOSIS — F17210 Nicotine dependence, cigarettes, uncomplicated: Secondary | ICD-10-CM | POA: Diagnosis not present

## 2016-10-12 DIAGNOSIS — E049 Nontoxic goiter, unspecified: Secondary | ICD-10-CM | POA: Diagnosis not present

## 2016-10-12 DIAGNOSIS — Z9181 History of falling: Secondary | ICD-10-CM | POA: Diagnosis not present

## 2016-10-12 DIAGNOSIS — I110 Hypertensive heart disease with heart failure: Secondary | ICD-10-CM | POA: Diagnosis not present

## 2016-10-12 DIAGNOSIS — I509 Heart failure, unspecified: Secondary | ICD-10-CM | POA: Diagnosis not present

## 2016-10-12 DIAGNOSIS — E119 Type 2 diabetes mellitus without complications: Secondary | ICD-10-CM | POA: Diagnosis not present

## 2016-10-12 NOTE — Telephone Encounter (Signed)
Pt was seen in [facility] ER for abd pain. Pt states she was told to follow up with GI. Pt scheduled to see Alonza Bogus PA 10/20/16@9 :30am. Pt aware of appt.

## 2016-10-13 ENCOUNTER — Encounter (HOSPITAL_COMMUNITY): Payer: Self-pay | Admitting: Psychiatry

## 2016-10-13 ENCOUNTER — Ambulatory Visit (INDEPENDENT_AMBULATORY_CARE_PROVIDER_SITE_OTHER): Payer: Medicare HMO | Admitting: Psychiatry

## 2016-10-13 DIAGNOSIS — F331 Major depressive disorder, recurrent, moderate: Secondary | ICD-10-CM

## 2016-10-13 NOTE — Telephone Encounter (Signed)
Spoke with patient- Was treated at ER for SBO

## 2016-10-13 NOTE — Telephone Encounter (Signed)
See previous message

## 2016-10-13 NOTE — Telephone Encounter (Signed)
Spoke with pt- see previous message. 

## 2016-10-13 NOTE — Telephone Encounter (Signed)
Is already receiving SN and PT from [facility]

## 2016-10-13 NOTE — Progress Notes (Signed)
Patient:  [name]   DOB: 1950/11/20          MR Number: [id number]  Location: [facility]:  [address] [place],  [place],   [place]  Start: Friday 10/13/2016 8:17 AM  End:       Friday 10/14/2015 9:10 AM  Provider/Observer:     Maurice Small, MSW, LCSW   Chief Complaint:              Depression  Reason For Service:               [name] is a 66 y.o. female who presents with a long standing history of recurrent periods of depression beginning when she was 2 and her favorite uncle died. She has been experiencing increased symptoms of depression recently due to stress in the relationship with her son. Per patient's report, he rarely visits her anymore due to his involvement with an older woman who is controlling. She also reports frustration regarding her knees who is her power of attorn      ey and is very demanding per      patient's report. She reports issues with other family members and states she needs help dealing with her family Patient reports multiple psychiatric hospitlaizations due to depression and suicidal ideations with thel last one occuring in 1997. Patient has participated in outpatient psychotherapy and medication management intermittently since age 66.  She currently is seeing psychiatrist Dr. Harrington Challenger . Prior to this, she was being seen at [facility]. Patient also has had ECT at [facility].    Interventions Strategy:  Supportive  Participation Level:   Active      Participation Quality:  Appropriate      Behavioral Observation:  Casual, fairly groomed, drowsy, speech very slow   Current Psychosocial Factors: Tension in relationship with son, multiple health issues, concerns about grandson  Content of Session:   reviewed symptoms, discussed stressors, facilitated expression of thoughts and feelings regarding stressors, assisted patient identify, challenge, and replace negative thoughts about her son's relationship with her grandson and replace with  healthy alternatives, assisted patient identify realistic expectations and ways to respect boundaries in their relationship,   Current Status:   improved mood, absence of irritability, continued worry  Suicidal/Homicidal:    No   Patient Progress:   Good. Patient reports decreased irritability and anger since last session.  She reports talking with her pastor about incidents in which she was verbally aggressive. She reports this was helpful and says she has been working on asking for forgiveness. She reports increased worry about her son's relationship with her grandson. She fears her grandson may eventually go down the wrong path if he doesn't have a better relationship with his father. She expresses frustration with grandson as he does not contact her frequently. Patient reports continued health issues and reports not feeling very well today. She has an appointment with her GI doctor next Friday.   Target Goals:   1.  Learn and implement behavioral strategies to overcome depression.    2.  Verbalize an understanding and resolution of current interpersonal problems.   Last Reviewed:   10/13/2016  Goals Addressed Today:      Plan:      Return again in 2 - 3 weeks.  Impression/Diagnosis:   Patient presents with long standing history of recurrent periods  of depression beginning when she was [age] and her favorite uncle died. Patient reports multiple psychiatric hospitlaizations due to  depression and suicidal ideations with thel last one occuring in 1997. Patient has participated in outpatient psychotherapy and medication management intermittently since age 56.  She currently is seeing psychiatrist Dr. Harrington Challenger . Prior to this, she was being seen at [facility]. Patient also has had ECT at [facility]. Symptoms have worsened in recent months due to family stress. Current symptoms include depressed mood, anxiety, excessive worry, and tearfulness.   Diagnosis:  Axis I: MDD recurrent episode,  moderate          Axis II: Deferred     [name] Tupou, LCSW 10/13/2016

## 2016-10-13 NOTE — Telephone Encounter (Signed)
Spoke with patient.

## 2016-10-13 NOTE — Telephone Encounter (Signed)
error 

## 2016-10-15 ENCOUNTER — Emergency Department (HOSPITAL_COMMUNITY): Payer: Medicare HMO

## 2016-10-15 ENCOUNTER — Encounter (HOSPITAL_COMMUNITY): Payer: Self-pay | Admitting: *Deleted

## 2016-10-15 ENCOUNTER — Inpatient Hospital Stay (HOSPITAL_COMMUNITY)
Admission: EM | Admit: 2016-10-15 | Discharge: 2016-10-20 | DRG: 208 | Disposition: A | Discharge status: Home Health | Payer: Medicare HMO | Attending: Internal Medicine | Admitting: Internal Medicine

## 2016-10-15 DIAGNOSIS — F419 Anxiety disorder, unspecified: Secondary | ICD-10-CM | POA: Diagnosis present

## 2016-10-15 DIAGNOSIS — R748 Abnormal levels of other serum enzymes: Secondary | ICD-10-CM | POA: Diagnosis present

## 2016-10-15 DIAGNOSIS — F319 Bipolar disorder, unspecified: Secondary | ICD-10-CM | POA: Diagnosis present

## 2016-10-15 DIAGNOSIS — M810 Age-related osteoporosis without current pathological fracture: Secondary | ICD-10-CM | POA: Diagnosis present

## 2016-10-15 DIAGNOSIS — G4700 Insomnia, unspecified: Secondary | ICD-10-CM | POA: Diagnosis present

## 2016-10-15 DIAGNOSIS — E782 Mixed hyperlipidemia: Secondary | ICD-10-CM | POA: Diagnosis present

## 2016-10-15 DIAGNOSIS — M549 Dorsalgia, unspecified: Secondary | ICD-10-CM | POA: Diagnosis present

## 2016-10-15 DIAGNOSIS — E66811 Obesity, class 1: Secondary | ICD-10-CM

## 2016-10-15 DIAGNOSIS — E1165 Type 2 diabetes mellitus with hyperglycemia: Secondary | ICD-10-CM | POA: Diagnosis present

## 2016-10-15 DIAGNOSIS — E860 Dehydration: Secondary | ICD-10-CM | POA: Diagnosis present

## 2016-10-15 DIAGNOSIS — I69359 Hemiplegia and hemiparesis following cerebral infarction affecting unspecified side: Secondary | ICD-10-CM

## 2016-10-15 DIAGNOSIS — G9341 Metabolic encephalopathy: Secondary | ICD-10-CM | POA: Diagnosis not present

## 2016-10-15 DIAGNOSIS — F39 Unspecified mood [affective] disorder: Secondary | ICD-10-CM | POA: Diagnosis present

## 2016-10-15 DIAGNOSIS — M797 Fibromyalgia: Secondary | ICD-10-CM | POA: Diagnosis present

## 2016-10-15 DIAGNOSIS — S0990XA Unspecified injury of head, initial encounter: Secondary | ICD-10-CM | POA: Diagnosis not present

## 2016-10-15 DIAGNOSIS — M5010 Cervical disc disorder with radiculopathy, unspecified cervical region: Secondary | ICD-10-CM | POA: Diagnosis present

## 2016-10-15 DIAGNOSIS — J9600 Acute respiratory failure, unspecified whether with hypoxia or hypercapnia: Secondary | ICD-10-CM | POA: Diagnosis not present

## 2016-10-15 DIAGNOSIS — E1143 Type 2 diabetes mellitus with diabetic autonomic (poly)neuropathy: Secondary | ICD-10-CM | POA: Diagnosis present

## 2016-10-15 DIAGNOSIS — T50905A Adverse effect of unspecified drugs, medicaments and biological substances, initial encounter: Secondary | ICD-10-CM | POA: Diagnosis present

## 2016-10-15 DIAGNOSIS — Z794 Long term (current) use of insulin: Secondary | ICD-10-CM | POA: Diagnosis present

## 2016-10-15 DIAGNOSIS — Z881 Allergy status to other antibiotic agents status: Secondary | ICD-10-CM

## 2016-10-15 DIAGNOSIS — G4733 Obstructive sleep apnea (adult) (pediatric): Secondary | ICD-10-CM | POA: Diagnosis present

## 2016-10-15 DIAGNOSIS — I272 Pulmonary hypertension, unspecified: Secondary | ICD-10-CM | POA: Diagnosis not present

## 2016-10-15 DIAGNOSIS — E1159 Type 2 diabetes mellitus with other circulatory complications: Secondary | ICD-10-CM | POA: Diagnosis present

## 2016-10-15 DIAGNOSIS — Z888 Allergy status to other drugs, medicaments and biological substances status: Secondary | ICD-10-CM

## 2016-10-15 DIAGNOSIS — E785 Hyperlipidemia, unspecified: Secondary | ICD-10-CM | POA: Diagnosis present

## 2016-10-15 DIAGNOSIS — R109 Unspecified abdominal pain: Secondary | ICD-10-CM

## 2016-10-15 DIAGNOSIS — Z91011 Allergy to milk products: Secondary | ICD-10-CM

## 2016-10-15 DIAGNOSIS — E039 Hypothyroidism, unspecified: Secondary | ICD-10-CM | POA: Diagnosis present

## 2016-10-15 DIAGNOSIS — E042 Nontoxic multinodular goiter: Secondary | ICD-10-CM | POA: Diagnosis present

## 2016-10-15 DIAGNOSIS — E1142 Type 2 diabetes mellitus with diabetic polyneuropathy: Secondary | ICD-10-CM | POA: Diagnosis present

## 2016-10-15 DIAGNOSIS — Y95 Nosocomial condition: Secondary | ICD-10-CM | POA: Diagnosis present

## 2016-10-15 DIAGNOSIS — K5900 Constipation, unspecified: Secondary | ICD-10-CM | POA: Diagnosis not present

## 2016-10-15 DIAGNOSIS — F17210 Nicotine dependence, cigarettes, uncomplicated: Secondary | ICD-10-CM | POA: Diagnosis present

## 2016-10-15 DIAGNOSIS — D509 Iron deficiency anemia, unspecified: Secondary | ICD-10-CM | POA: Diagnosis not present

## 2016-10-15 DIAGNOSIS — R569 Unspecified convulsions: Secondary | ICD-10-CM | POA: Diagnosis not present

## 2016-10-15 DIAGNOSIS — K56609 Unspecified intestinal obstruction, unspecified as to partial versus complete obstruction: Secondary | ICD-10-CM | POA: Diagnosis not present

## 2016-10-15 DIAGNOSIS — K3184 Gastroparesis: Secondary | ICD-10-CM | POA: Diagnosis present

## 2016-10-15 DIAGNOSIS — J189 Pneumonia, unspecified organism: Secondary | ICD-10-CM | POA: Diagnosis not present

## 2016-10-15 DIAGNOSIS — J9602 Acute respiratory failure with hypercapnia: Secondary | ICD-10-CM | POA: Diagnosis present

## 2016-10-15 DIAGNOSIS — A419 Sepsis, unspecified organism: Secondary | ICD-10-CM

## 2016-10-15 DIAGNOSIS — Z452 Encounter for adjustment and management of vascular access device: Secondary | ICD-10-CM | POA: Diagnosis not present

## 2016-10-15 DIAGNOSIS — K219 Gastro-esophageal reflux disease without esophagitis: Secondary | ICD-10-CM | POA: Diagnosis present

## 2016-10-15 DIAGNOSIS — H409 Unspecified glaucoma: Secondary | ICD-10-CM | POA: Diagnosis present

## 2016-10-15 DIAGNOSIS — J441 Chronic obstructive pulmonary disease with (acute) exacerbation: Secondary | ICD-10-CM | POA: Diagnosis present

## 2016-10-15 DIAGNOSIS — E669 Obesity, unspecified: Secondary | ICD-10-CM | POA: Diagnosis present

## 2016-10-15 DIAGNOSIS — K5904 Chronic idiopathic constipation: Secondary | ICD-10-CM | POA: Diagnosis present

## 2016-10-15 DIAGNOSIS — J449 Chronic obstructive pulmonary disease, unspecified: Secondary | ICD-10-CM | POA: Diagnosis not present

## 2016-10-15 DIAGNOSIS — J9811 Atelectasis: Secondary | ICD-10-CM | POA: Diagnosis not present

## 2016-10-15 DIAGNOSIS — G43909 Migraine, unspecified, not intractable, without status migrainosus: Secondary | ICD-10-CM | POA: Diagnosis present

## 2016-10-15 DIAGNOSIS — J690 Pneumonitis due to inhalation of food and vomit: Principal | ICD-10-CM | POA: Diagnosis present

## 2016-10-15 DIAGNOSIS — J9601 Acute respiratory failure with hypoxia: Secondary | ICD-10-CM | POA: Diagnosis not present

## 2016-10-15 DIAGNOSIS — I959 Hypotension, unspecified: Secondary | ICD-10-CM

## 2016-10-15 DIAGNOSIS — Z9989 Dependence on other enabling machines and devices: Secondary | ICD-10-CM

## 2016-10-15 DIAGNOSIS — Z79899 Other long term (current) drug therapy: Secondary | ICD-10-CM

## 2016-10-15 DIAGNOSIS — N179 Acute kidney failure, unspecified: Secondary | ICD-10-CM | POA: Diagnosis present

## 2016-10-15 DIAGNOSIS — R0989 Other specified symptoms and signs involving the circulatory and respiratory systems: Secondary | ICD-10-CM | POA: Diagnosis present

## 2016-10-15 DIAGNOSIS — R296 Repeated falls: Secondary | ICD-10-CM | POA: Diagnosis present

## 2016-10-15 DIAGNOSIS — R1312 Dysphagia, oropharyngeal phase: Secondary | ICD-10-CM | POA: Diagnosis present

## 2016-10-15 DIAGNOSIS — Z886 Allergy status to analgesic agent status: Secondary | ICD-10-CM

## 2016-10-15 DIAGNOSIS — R51 Headache: Secondary | ICD-10-CM | POA: Diagnosis not present

## 2016-10-15 DIAGNOSIS — G40909 Epilepsy, unspecified, not intractable, without status epilepticus: Secondary | ICD-10-CM | POA: Diagnosis present

## 2016-10-15 DIAGNOSIS — Z7982 Long term (current) use of aspirin: Secondary | ICD-10-CM

## 2016-10-15 DIAGNOSIS — R4182 Altered mental status, unspecified: Secondary | ICD-10-CM | POA: Diagnosis not present

## 2016-10-15 DIAGNOSIS — R402432 Glasgow coma scale score 3-8, at arrival to emergency department: Secondary | ICD-10-CM | POA: Diagnosis present

## 2016-10-15 DIAGNOSIS — Z6833 Body mass index (BMI) 33.0-33.9, adult: Secondary | ICD-10-CM

## 2016-10-15 DIAGNOSIS — R392 Extrarenal uremia: Secondary | ICD-10-CM | POA: Diagnosis present

## 2016-10-15 DIAGNOSIS — R0902 Hypoxemia: Secondary | ICD-10-CM | POA: Diagnosis not present

## 2016-10-15 DIAGNOSIS — G5790 Unspecified mononeuropathy of unspecified lower limb: Secondary | ICD-10-CM | POA: Diagnosis present

## 2016-10-15 DIAGNOSIS — I10 Essential (primary) hypertension: Secondary | ICD-10-CM | POA: Diagnosis present

## 2016-10-15 DIAGNOSIS — Z880 Allergy status to penicillin: Secondary | ICD-10-CM

## 2016-10-15 DIAGNOSIS — G8929 Other chronic pain: Secondary | ICD-10-CM | POA: Diagnosis present

## 2016-10-15 LAB — CBC WITH DIFFERENTIAL/PLATELET
BASOS ABS: 0 10*3/uL (ref 0.0–0.1)
BASOS PCT: 0 %
EOS ABS: 0.2 10*3/uL (ref 0.0–0.7)
EOS PCT: 3 %
HCT: 34.2 % — ABNORMAL LOW (ref 36.0–46.0)
HEMOGLOBIN: 10.2 g/dL — AB (ref 12.0–15.0)
Lymphocytes Relative: 32 %
Lymphs Abs: 2.2 10*3/uL (ref 0.7–4.0)
MCH: 22.7 pg — ABNORMAL LOW (ref 26.0–34.0)
MCHC: 29.8 g/dL — ABNORMAL LOW (ref 30.0–36.0)
MCV: 76.2 fL — ABNORMAL LOW (ref 78.0–100.0)
Monocytes Absolute: 0.5 10*3/uL (ref 0.1–1.0)
Monocytes Relative: 8 %
NEUTROS PCT: 57 %
Neutro Abs: 3.9 10*3/uL (ref 1.7–7.7)
PLATELETS: 244 10*3/uL (ref 150–400)
RBC: 4.49 MIL/uL (ref 3.87–5.11)
RDW: 14.7 % (ref 11.5–15.5)
WBC: 6.7 10*3/uL (ref 4.0–10.5)

## 2016-10-15 LAB — BLOOD GAS, ARTERIAL
ACID-BASE DEFICIT: 3.2 mmol/L — AB (ref 0.0–2.0)
ACID-BASE EXCESS: 3.5 mmol/L — AB (ref 0.0–2.0)
BICARBONATE: 21.2 mmol/L (ref 20.0–28.0)
Bicarbonate: 21.2 mmol/L (ref 20.0–28.0)
DRAWN BY: 22223
Drawn by: 23534
FIO2: 1
FIO2: 40
LHR: 16 {breaths}/min
MECHVT: 400 mL
O2 Content: 100 L/min
O2 SAT: 99.4 %
O2 Saturation: 97.7 %
PATIENT TEMPERATURE: 36.5
PCO2 ART: 45.9 mmHg (ref 32.0–48.0)
PEEP/CPAP: 5 cmH2O
PEEP: 5 cmH2O
PH ART: 7.246 — AB (ref 7.350–7.450)
PO2 ART: 385 mmHg — AB (ref 83.0–108.0)
PO2 ART: 109 mmHg — AB (ref 83.0–108.0)
RATE: 14 resp/min
VT: 350 mL
pCO2 arterial: 54.9 mmHg — ABNORMAL HIGH (ref 32.0–48.0)
pH, Arterial: 7.3 — ABNORMAL LOW (ref 7.350–7.450)

## 2016-10-15 LAB — URINALYSIS, ROUTINE W REFLEX MICROSCOPIC
Glucose, UA: NEGATIVE mg/dL
Hgb urine dipstick: NEGATIVE
LEUKOCYTES UA: NEGATIVE
Nitrite: NEGATIVE
Protein, ur: 100 mg/dL — AB
pH: 6 (ref 5.0–8.0)

## 2016-10-15 LAB — MRSA PCR SCREENING: MRSA BY PCR: NEGATIVE

## 2016-10-15 LAB — URINALYSIS, MICROSCOPIC (REFLEX)

## 2016-10-15 LAB — COMPREHENSIVE METABOLIC PANEL
ALT: 22 U/L (ref 14–54)
AST: 29 U/L (ref 15–41)
Albumin: 3.7 g/dL (ref 3.5–5.0)
Alkaline Phosphatase: 96 U/L (ref 38–126)
Anion gap: 12 (ref 5–15)
BILIRUBIN TOTAL: 0.4 mg/dL (ref 0.3–1.2)
BUN: 28 mg/dL — AB (ref 6–20)
CALCIUM: 9.2 mg/dL (ref 8.9–10.3)
CHLORIDE: 107 mmol/L (ref 101–111)
CO2: 25 mmol/L (ref 22–32)
CREATININE: 1.91 mg/dL — AB (ref 0.44–1.00)
GFR, EST AFRICAN AMERICAN: 30 mL/min — AB (ref >60)
GFR, EST NON AFRICAN AMERICAN: 26 mL/min — AB (ref >60)
Glucose, Bld: 233 mg/dL — ABNORMAL HIGH (ref 65–99)
Potassium: 3.5 mmol/L (ref 3.5–5.1)
Sodium: 144 mmol/L (ref 135–145)
TOTAL PROTEIN: 6.9 g/dL (ref 6.5–8.1)

## 2016-10-15 LAB — D-DIMER, QUANTITATIVE (NOT AT ARMC): D DIMER QUANT: 0.28 ug{FEU}/mL (ref 0.00–0.50)

## 2016-10-15 LAB — RETICULOCYTES
RBC.: 4.48 MIL/uL (ref 3.87–5.11)
RETIC COUNT ABSOLUTE: 85.1 10*3/uL (ref 19.0–186.0)
Retic Ct Pct: 1.9 % (ref 0.4–3.1)

## 2016-10-15 LAB — I-STAT CG4 LACTIC ACID, ED
LACTIC ACID, VENOUS: 1.38 mmol/L (ref 0.5–1.9)
Lactic Acid, Venous: 3.14 mmol/L (ref 0.5–1.9)

## 2016-10-15 LAB — I-STAT CHEM 8, ED
BUN: 32 mg/dL — ABNORMAL HIGH (ref 6–20)
CALCIUM ION: 1.22 mmol/L (ref 1.15–1.40)
CHLORIDE: 105 mmol/L (ref 101–111)
Creatinine, Ser: 1.9 mg/dL — ABNORMAL HIGH (ref 0.44–1.00)
Glucose, Bld: 231 mg/dL — ABNORMAL HIGH (ref 65–99)
HEMATOCRIT: 33 % — AB (ref 36.0–46.0)
Hemoglobin: 11.2 g/dL — ABNORMAL LOW (ref 12.0–15.0)
Potassium: 3.6 mmol/L (ref 3.5–5.1)
SODIUM: 143 mmol/L (ref 135–145)
TCO2: 25 mmol/L (ref 0–100)

## 2016-10-15 LAB — RAPID URINE DRUG SCREEN, HOSP PERFORMED
Amphetamines: NOT DETECTED
Barbiturates: NOT DETECTED
Benzodiazepines: POSITIVE — AB
COCAINE: NOT DETECTED
OPIATES: NOT DETECTED
TETRAHYDROCANNABINOL: NOT DETECTED

## 2016-10-15 LAB — PROCALCITONIN: Procalcitonin: <0.1 ng/mL

## 2016-10-15 LAB — TSH: TSH: 0.827 u[IU]/mL (ref 0.350–4.500)

## 2016-10-15 LAB — GLUCOSE, CAPILLARY: GLUCOSE-CAPILLARY: 208 mg/dL — AB (ref 65–99)

## 2016-10-15 LAB — APTT: aPTT: 27 seconds (ref 24–36)

## 2016-10-15 LAB — LACTIC ACID, PLASMA: Lactic Acid, Venous: 1.7 mmol/L (ref 0.5–1.9)

## 2016-10-15 LAB — LITHIUM LEVEL

## 2016-10-15 LAB — CBG MONITORING, ED: Glucose-Capillary: 225 mg/dL — ABNORMAL HIGH (ref 65–99)

## 2016-10-15 LAB — ETHANOL: Alcohol, Ethyl (B): <5 mg/dL (ref <5)

## 2016-10-15 LAB — TROPONIN I

## 2016-10-15 LAB — PROTIME-INR
INR: 1.07
PROTHROMBIN TIME: 13.9 s (ref 11.4–15.2)

## 2016-10-15 LAB — AMMONIA: Ammonia: 44 umol/L — ABNORMAL HIGH (ref 9–35)

## 2016-10-15 LAB — MAGNESIUM: MAGNESIUM: 2 mg/dL (ref 1.7–2.4)

## 2016-10-15 MED ORDER — SODIUM CHLORIDE 0.9 % IV SOLN
20.0000 ug/h | INTRAVENOUS | Status: DC
  Administered 2016-10-15: 20 ug/h via INTRAVENOUS
  Administered 2016-10-16: 100 ug/h via INTRAVENOUS
  Administered 2016-10-17: 400 ug/h via INTRAVENOUS
  Administered 2016-10-17: 50 ug/h via INTRAVENOUS
  Filled 2016-10-15 (×3): qty 50

## 2016-10-15 MED ORDER — CHLORHEXIDINE GLUCONATE 0.12% ORAL RINSE (MEDLINE KIT)
15.0000 mL | Freq: Two times a day (BID) | OROMUCOSAL | Status: DC
  Administered 2016-10-15 – 2016-10-18 (×7): 15 mL via OROMUCOSAL

## 2016-10-15 MED ORDER — ENOXAPARIN SODIUM 30 MG/0.3ML ~~LOC~~ SOLN
30.0000 mg | SUBCUTANEOUS | Status: DC
  Administered 2016-10-15: 30 mg via SUBCUTANEOUS
  Filled 2016-10-15: qty 0.3

## 2016-10-15 MED ORDER — SODIUM CHLORIDE 0.9 % IV BOLUS (SEPSIS)
1000.0000 mL | Freq: Once | INTRAVENOUS | Status: AC
  Administered 2016-10-15: 1000 mL via INTRAVENOUS

## 2016-10-15 MED ORDER — FENTANYL CITRATE (PF) 2500 MCG/50ML IJ SOLN
INTRAMUSCULAR | Status: AC
  Filled 2016-10-15: qty 50

## 2016-10-15 MED ORDER — METHYLPREDNISOLONE SODIUM SUCC 125 MG IJ SOLR
80.0000 mg | Freq: Two times a day (BID) | INTRAMUSCULAR | Status: DC
  Administered 2016-10-16 – 2016-10-17 (×4): 80 mg via INTRAVENOUS
  Filled 2016-10-15 (×4): qty 2

## 2016-10-15 MED ORDER — NALOXONE HCL 0.4 MG/ML IJ SOLN
INTRAMUSCULAR | Status: AC
  Administered 2016-10-15: 0.4 mg via INTRAVENOUS
  Filled 2016-10-15: qty 1

## 2016-10-15 MED ORDER — SODIUM CHLORIDE 0.9 % IV BOLUS (SEPSIS)
250.0000 mL | Freq: Once | INTRAVENOUS | Status: AC
  Administered 2016-10-15: 250 mL via INTRAVENOUS

## 2016-10-15 MED ORDER — ONDANSETRON HCL 4 MG/2ML IJ SOLN
4.0000 mg | Freq: Four times a day (QID) | INTRAMUSCULAR | Status: DC | PRN
  Administered 2016-10-18: 4 mg via INTRAVENOUS
  Filled 2016-10-15: qty 2

## 2016-10-15 MED ORDER — LEVOFLOXACIN IN D5W 750 MG/150ML IV SOLN
750.0000 mg | Freq: Once | INTRAVENOUS | Status: AC
  Administered 2016-10-15: 750 mg via INTRAVENOUS
  Filled 2016-10-15: qty 150

## 2016-10-15 MED ORDER — AZTREONAM 1 G IJ SOLR
1.0000 g | Freq: Three times a day (TID) | INTRAMUSCULAR | Status: DC
  Administered 2016-10-15 – 2016-10-19 (×11): 1 g via INTRAVENOUS
  Filled 2016-10-15 (×15): qty 1

## 2016-10-15 MED ORDER — ONDANSETRON HCL 4 MG PO TABS: 4.0000 mg | ORAL_TABLET | Freq: Four times a day (QID) | ORAL | Status: DC | PRN

## 2016-10-15 MED ORDER — VANCOMYCIN HCL IN DEXTROSE 750-5 MG/150ML-% IV SOLN
750.0000 mg | INTRAVENOUS | Status: DC
  Filled 2016-10-15: qty 150

## 2016-10-15 MED ORDER — LORAZEPAM 2 MG/ML IJ SOLN
1.0000 mg | INTRAMUSCULAR | Status: DC | PRN
  Administered 2016-10-16 – 2016-10-18 (×7): 1 mg via INTRAVENOUS
  Filled 2016-10-15 (×7): qty 1

## 2016-10-15 MED ORDER — INSULIN ASPART 100 UNIT/ML ~~LOC~~ SOLN
0.0000 [IU] | SUBCUTANEOUS | Status: DC
  Administered 2016-10-15: 3 [IU] via SUBCUTANEOUS
  Administered 2016-10-16: 1 [IU] via SUBCUTANEOUS
  Administered 2016-10-16: 2 [IU] via SUBCUTANEOUS
  Administered 2016-10-16: 1 [IU] via SUBCUTANEOUS
  Administered 2016-10-16: 2 [IU] via SUBCUTANEOUS
  Administered 2016-10-16 – 2016-10-17 (×3): 1 [IU] via SUBCUTANEOUS
  Administered 2016-10-17 (×2): 2 [IU] via SUBCUTANEOUS
  Administered 2016-10-17: 3 [IU] via SUBCUTANEOUS
  Administered 2016-10-17 (×2): 2 [IU] via SUBCUTANEOUS
  Administered 2016-10-18: 3 [IU] via SUBCUTANEOUS
  Administered 2016-10-18 – 2016-10-19 (×4): 2 [IU] via SUBCUTANEOUS
  Administered 2016-10-19: 1 [IU] via SUBCUTANEOUS
  Administered 2016-10-19 (×2): 3 [IU] via SUBCUTANEOUS
  Administered 2016-10-19: 1 [IU] via SUBCUTANEOUS
  Administered 2016-10-19: 2 [IU] via SUBCUTANEOUS
  Administered 2016-10-20: 3 [IU] via SUBCUTANEOUS
  Administered 2016-10-20: 2 [IU] via SUBCUTANEOUS
  Administered 2016-10-20: 4 [IU] via SUBCUTANEOUS

## 2016-10-15 MED ORDER — PANTOPRAZOLE SODIUM 40 MG IV SOLR
40.0000 mg | INTRAVENOUS | Status: DC
  Administered 2016-10-15 – 2016-10-16 (×2): 40 mg via INTRAVENOUS
  Filled 2016-10-15 (×2): qty 40

## 2016-10-15 MED ORDER — VANCOMYCIN HCL IN DEXTROSE 1-5 GM/200ML-% IV SOLN
1000.0000 mg | Freq: Once | INTRAVENOUS | Status: AC
  Administered 2016-10-15: 1000 mg via INTRAVENOUS
  Filled 2016-10-15: qty 200

## 2016-10-15 MED ORDER — LEVOFLOXACIN IN D5W 750 MG/150ML IV SOLN: 750.0000 mg | INTRAVENOUS | Status: DC

## 2016-10-15 MED ORDER — ORAL CARE MOUTH RINSE
15.0000 mL | Freq: Four times a day (QID) | OROMUCOSAL | Status: DC
  Administered 2016-10-15 – 2016-10-19 (×14): 15 mL via OROMUCOSAL

## 2016-10-15 MED ORDER — SODIUM CHLORIDE 0.9 % IV SOLN
20.0000 ug/h | INTRAVENOUS | Status: DC
  Administered 2016-10-15: 50 ug/h via INTRAVENOUS
  Filled 2016-10-15: qty 50

## 2016-10-15 MED ORDER — DEXTROSE 5 % IV SOLN
2.0000 g | Freq: Once | INTRAVENOUS | Status: AC
  Administered 2016-10-15: 2 g via INTRAVENOUS
  Filled 2016-10-15: qty 2

## 2016-10-15 MED ORDER — NALOXONE HCL 0.4 MG/ML IJ SOLN
0.4000 mg | Freq: Once | INTRAMUSCULAR | Status: AC
  Administered 2016-10-15: 0.4 mg via INTRAVENOUS

## 2016-10-15 MED ORDER — ROCURONIUM BROMIDE 50 MG/5ML IV SOLN
70.0000 mg | Freq: Once | INTRAVENOUS | Status: AC
  Administered 2016-10-15: 70 mg via INTRAVENOUS
  Filled 2016-10-15: qty 7

## 2016-10-15 MED ORDER — SODIUM CHLORIDE 0.9 % IV SOLN
1000.0000 mL | INTRAVENOUS | Status: DC
  Administered 2016-10-15 – 2016-10-17 (×4): 1000 mL via INTRAVENOUS

## 2016-10-15 MED ORDER — ETOMIDATE 2 MG/ML IV SOLN
20.0000 mg | Freq: Once | INTRAVENOUS | Status: AC
  Administered 2016-10-15: 20 mg via INTRAVENOUS

## 2016-10-15 MED ORDER — METHYLPREDNISOLONE SODIUM SUCC 125 MG IJ SOLR
125.0000 mg | Freq: Once | INTRAMUSCULAR | Status: AC
  Administered 2016-10-15: 125 mg via INTRAVENOUS
  Filled 2016-10-15: qty 2

## 2016-10-15 NOTE — Assessment & Plan Note (Signed)
Ongoing challenge following recent hospitalization for SBO, this is multifactorial, hopefully she will be able to correct without requiring re admission

## 2016-10-15 NOTE — ED Provider Notes (Signed)
[facility] DEPT Provider Note   CSN: [id number] Arrival date & time: 10/15/16  1524     History   Chief Complaint Chief Complaint  Patient presents with  . Back Pain    HPI [name] is a 66 y.o. female.  The patient apparently dropped off by family members in the waiting room. Was noted to be very drowsy out there was brought back to the emergency department was noted to be hypotensive. Patient not following commands. Nurses had some remote history that maybe she fell getting off a bus a few days ago and fell backwards and hit the back of her head. No family members present.      Past Medical History:  Diagnosis Date  . Anemia   . Anxiety    takes Ativan daily  . Arthritis   . Bipolar disorder ([place])    takes Risperdal nightly  . Blood transfusion   . Cancer ([place])    In her gum  . Carpal tunnel syndrome of right wrist 05/23/2011  . Cervical disc disorder with radiculopathy of cervical region 10/31/2012  . Chronic back pain   . Chronic idiopathic constipation   . Colon polyps   . COPD (chronic obstructive pulmonary disease) with chronic bronchitis ([place]) 09/16/2013   Office Spirometry 10/30/2013-submaximal effort based on appearance of loop and curve. Numbers would fit with severe restriction but her physiologic capability may be better than this. FVC 0.91/44%, and 10.74/45%, FEV1/FVC 0.81, FEF 25-75% 1.43/69%    . Depression    takes Zoloft daily  . Diabetes mellitus    Type II  . Diverticulosis    TCS 9/08 by Dr. Delfin Edis for diarrhea . Bx for micro scopic colitis negative.   . Fibromyalgia   . Frequent falls   . GERD (gastroesophageal reflux disease)    takes Aciphex daily  . Glaucoma    eye drops daily  . Gum symptoms    infection on antibiotic  . Hemiplegia affecting non-dominant side, post-stroke 08/02/2011  . Hyperlipidemia    takes Crestor daily  . Hypertension    takes Amlodipine,Metoprolol,and Clonidine daily  . Hypothyroidism    takes  Synthroid daily  . IBS (irritable bowel syndrome)   . Insomnia    takes Trazodone nightly  . Metabolic encephalopathy 4/97/0263  . Migraines    chronic headaches  . Mononeuritis lower limb   . Osteoporosis   . Pancreatitis 2006   due to Depakote with normal EUS   . Schatzki's ring    non critical / EGD with ED 8/2011with RMR  . Seizures ([place])    takes Lamictal daily.Last seizure 3 yrs ago  . Sleep apnea    on CPAP  . Stroke [facility])    left sided weakness  . Tubular adenoma of colon     Patient Active Problem List   Diagnosis Date Noted  . SBO (small bowel obstruction) ([place]) 09/10/2016  . Diabetic polyneuropathy associated with type 2 diabetes mellitus ([place]) 05/19/2016  . Smoker 04/24/2016  . Class 1 obesity with serious comorbidity and body mass index (BMI) of 33.0 to 33.9 in adult 04/24/2016  . Ulcer of toe, left, limited to breakdown of skin ([place]) 04/18/2016  . History of palpitations 08/09/2015  . Nausea without vomiting 08/09/2015  . Labile hypertension 08/03/2015  . Normal coronary arteries 08/03/2015  . Left-sided low back pain with left-sided sciatica 06/27/2015  . Left flank pain 06/27/2015  . Acute cystitis without hematuria 05/10/2015  . Poorly controlled type  2 diabetes mellitus with circulatory disorder ([place]) 05/06/2015  . Multinodular goiter 05/06/2015  . Rectocele, female 04/27/2015  . Anal sphincter incontinence 04/27/2015  . Pelvic relaxation due to rectocele 03/30/2015  . Hospital discharge follow-up 03/02/2015  . Pulmonary hypertension ([place]) 02/22/2015  . Weakness of left upper extremity 02/21/2015  . Light cigarette smoker (1-9 cigarettes per day) 01/11/2015  . Migraine without aura and without status migrainosus, not intractable 07/02/2014  . Flatulence 02/18/2014  . Microcytic anemia 02/18/2014  . COPD (chronic obstructive pulmonary disease) with chronic bronchitis ([place]) 09/16/2013  . Hypothyroidism 08/16/2013  . Gastroparesis 04/28/2013  .  Seizure disorder ([place]) 01/19/2013  . Cervical disc disorder with radiculopathy of cervical region 10/31/2012  . Solitary pulmonary nodule 08/19/2012  . Anemia 07/05/2012  . 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 Allergic sinusitis 04/18/2012  . Meningioma ([place]) 11/19/2011  . 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 Hemiplegia affecting non-dominant side, post-stroke 08/02/2011  . Carpal tunnel syndrome of right wrist 05/23/2011  . Polypharmacy 04/28/2011  . Bipolar disorder ([place]) 04/28/2011  . Constipation 04/13/2011  . Falls frequently 12/12/2010  . Urinary incontinence 12/16/2009  . HEARING LOSS 10/26/2009  . Hyperlipidemia 12/11/2008  . IBS 12/11/2008  . GERD 07/29/2008  . MILK PRODUCTS ALLERGY 07/29/2008  . 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 Backache 06/19/2007  . Osteoporosis 06/19/2007  . Obstructive sleep apnea 06/19/2007  . TRIGGER FINGER 04/18/2007  . DIVERTICULOSIS, COLON 11/13/2006    Past Surgical History:  Procedure Laterality Date  . ABDOMINAL HYSTERECTOMY  1978  . BACK SURGERY  July 2012  . BACTERIAL OVERGROWTH TEST N/A 05/05/2013   Procedure: BACTERIAL OVERGROWTH TEST;  Surgeon: Daneil Dolin, MD;  Location: [facility];  Service: Endoscopy;  Laterality: N/A;  7:30  . BIOPSY THYROID  2009  . BRAIN SURGERY  11/2011   resection of meningioma  . BREAST REDUCTION SURGERY  1994  . CARDIAC CATHETERIZATION  05/10/2005   normal coronaries, normal LV systolic function and EF (Dr. Jackie Plum)  . CARPAL TUNNEL RELEASE Left 07/22/04   Dr. Aline Brochure  . CATARACT EXTRACTION Bilateral   . CHOLECYSTECTOMY  1984  . COLONOSCOPY N/A 09/25/2012   VFI:EPPIRJJ diverticulosis.  colonic polyp-removed : tubular adenoma  . CRANIOTOMY  11/23/2011   Procedure: CRANIOTOMY TUMOR EXCISION;  Surgeon: Hosie Spangle, MD;  Location: [facility] NEURO ORS;  Service: Neurosurgery;  Laterality: N/A;  Craniotomy for tumor resection  .  ESOPHAGOGASTRODUODENOSCOPY  12/29/2010   [place]k-Retained food in the esophagus and stomach, small hiatal hernia, status post Maloney dilation of the esophagus  . ESOPHAGOGASTRODUODENOSCOPY N/A 09/25/2012   OAC:ZYSAYTKZ atonic baggy esophagus status post Maloney dilation 56 F. Hiatal hernia  . GIVENS CAPSULE STUDY N/A 01/15/2013   NORMAL.   . IR GENERIC HISTORICAL  03/17/2016   IR RADIOLOGIST EVAL & MGMT 03/17/2016 [facility]-INTERV RAD  . LESION REMOVAL N/A 05/31/2015   Procedure: REMOVAL RIGHT AND LEFT LESIONS OF MANDIBLE;  Surgeon: Diona Browner, DDS;  Location: [facility];  Service: Oral Surgery;  Laterality: N/A;  . MALONEY DILATION  12/29/2010   RMR;  . NM MYOCAR PERF WALL MOTION  2006   "relavtiely normal" persantine, mild anterior thinning (breast attenuation artifact), no region of scar/ischemia  . OVARIAN CYST REMOVAL    . RECTOCELE REPAIR N/A 06/29/2015   Procedure: POSTERIOR REPAIR (RECTOCELE);  Surgeon: Jonnie Kind, MD;  Location: [facility] ORS;  Service: Gynecology;  Laterality: N/A;  . REDUCTION MAMMAPLASTY  Bilateral   . SPINE SURGERY  09/29/2010   Dr. Rolena Infante  . surgical excision of 3 tumors from right thigh and right buttock  and left upper thigh  2010  . TOOTH EXTRACTION Bilateral 12/14/2014   Procedure: REMOVAL OF BILATERAL MANDIBULAR EXOSTOSES;  Surgeon: Diona Browner, DDS;  Location: [facility];  Service: Oral Surgery;  Laterality: Bilateral;  . TRANSTHORACIC ECHOCARDIOGRAM  2010   EF 60-65%, mild conc LVH, grade 1 diastolic dysfunction; mildly calcified MV annulus with mildly thickened leaflets, mildly calcified MR annulus    OB History    Gravida Para Term Preterm AB Living   _0 SAB TAB Ectopic Multiple Live Births   5               Home Medications    Prior to Admission medications   Medication Sig Start Date End Date Taking? Authorizing Provider  ACCU-CHEK FASTCLIX LANCETS MISC Use to test blood sugar 4 times daily. Dx: E10.65 12/03/14   Philemon Kingdom, MD  ACCU-CHEK  SMARTVIEW test strip TEST BLOOD SUGAR FOUR TIMES DAILY AS DIRECTED 09/19/16   Philemon Kingdom, MD  albuterol (PROVENTIL HFA;VENTOLIN HFA) 108 (90 Base) MCG/ACT inhaler Inhale 1-2 puffs every 6 hours as needed for wheezing, shortness of breath 07/27/16   Baird Lyons D, MD  Alcohol Swabs (B-D SINGLE USE SWABS REGULAR) PADS Use for injections and glucose testing 8 times daily. Dx: E10.65 12/03/14   Philemon Kingdom, MD  amLODipine (NORVASC) 5 MG tablet TAKE 2 TABLETS BY MOUTH ONCE DAILY. 09/25/16   Philemon Kingdom, MD  aspirin EC 81 MG tablet Take 81 mg by mouth daily.    [provider]  Azelastine-Fluticasone (DYMISTA) 137-50 MCG/ACT SUSP Place 1 puff into the nose at bedtime. 07/27/15   Deneise Lever, MD  BD PEN NEEDLE NANO U/F 32G X 4 MM MISC USE FOUR TIMES DAILY 04/20/15   Philemon Kingdom, MD  Blood Glucose Calibration (ACCU-CHEK SMARTVIEW CONTROL) LIQD 1 each by Other route as needed. 12/03/14   Philemon Kingdom, MD  Blood Glucose Monitoring Suppl (ACCU-CHEK NANO SMARTVIEW) W/DEVICE KIT 1 each by Does not apply route daily. Dx: E10.65 Patient not taking: Reported on 09/27/2016 12/03/14   Philemon Kingdom, MD  Cholecalciferol (VITAMIN D) 2000 units CAPS Take 1 capsule by mouth daily.    [provider]  clobetasol cream (TEMOVATE) 3.82 % Apply 1 application topically daily.    [provider]  cloNIDine (CATAPRES) 0.3 MG tablet TAKE 1 TABLET BY MOUTH EVERY EIGHT HOURS. ONCE AT 8AM, 4PM, AND 12 MIDNIGHT. 08/30/16   [name], MD  Coenzyme Q-10 100 MG capsule Take 1 capsule (100 mg total) by mouth 3 (three) times daily. 08/09/16   Pieter Partridge, DO  diclofenac sodium (VOLTAREN) 1 % GEL Apply 2 g topically daily as needed (Pain). 07/21/16   [name], MD  hydroxychloroquine (PLAQUENIL) 200 MG tablet Take 200 mg by mouth daily.    [provider]  Insulin Glargine (TOUJEO SOLOSTAR) 300 UNIT/ML SOPN Inject 25 Units into the skin at bedtime.  06/27/16   Philemon Kingdom, MD  lamoTRIgine (LAMICTAL) 100 MG tablet Take 1 tablet (100 mg total) by mouth 2 (two) times daily. 08/30/16   Pieter Partridge, DO  levothyroxine (SYNTHROID, LEVOTHROID) 50 MCG tablet TAKE 1 TABLET BY MOUTH ONCE DAILY AND 1/2 TABLET ON SUNDAYS. 05/02/16   Philemon Kingdom, MD  LORazepam (ATIVAN) 0.5 MG tablet Take 1 tablet (  0.5 mg total) by mouth 3 (three) times daily. 09/27/16   Cloria Spring, MD  losartan (COZAAR) 50 MG tablet TAKE ONE TABLET BY MOUTH DAILY. 07/04/16   [name], MD  metoprolol tartrate (LOPRESSOR) 50 MG tablet Take 1 tablet (50 mg total) by mouth 2 (two) times daily. Please call and schedule appointment 09/26/16   Pixie Casino, MD  montelukast (SINGULAIR) 10 MG tablet TAKE ONE TABLET BY MOUTH ONCE DAILY. 08/30/16   [name], MD  Multiple Vitamins-Minerals (ONE-A-DAY 50 PLUS PO) Take 1 tablet by mouth daily.    [provider]  NOVOLOG FLEXPEN 100 UNIT/ML FlexPen INJECT 16 TO 30 UNITS INTO THE SKIN 3 (THREE) TIMES DAILY WITH MEALS. 01/06/16   Philemon Kingdom, MD  nystatin (MYCOSTATIN/NYSTOP) powder APPLY TO AFFECTED AREA FOUR TIMES DAILY. 04/03/16   [name], MD  ondansetron (ZOFRAN ODT) 8 MG disintegrating tablet Take 1 tablet (8 mg total) by mouth every 8 (eight) hours as needed for nausea or vomiting. 10/10/16   Jola Schmidt, MD  pregabalin (LYRICA) 75 MG capsule Take 75 mg by mouth 2 (two) times daily.  10/26/11   Carole Civil, MD  RABEprazole (ACIPHEX) 20 MG tablet Take 1 tablet (20 mg total) by mouth 2 (two) times daily. 06/12/16   Pyrtle, Lajuan Lines, MD  RESTASIS 0.05 % ophthalmic emulsion Place 1 drop into both eyes 2 (two) times daily.  02/04/14   [provider]  Riboflavin 400 MG TABS Take 400 mg by mouth daily. Patient not taking: Reported on 09/27/2016 08/09/16   Pieter Partridge, DO  risperiDONE (RISPERDAL) 0.5 MG tablet Take 1 tablet (0.5 mg total) by mouth at bedtime. 09/27/16   Cloria Spring,  MD  rosuvastatin (CRESTOR) 5 MG tablet TAKE 1 TABLET BY MOUTH AT BEDTIME. 08/30/16   [name], MD  sertraline (ZOLOFT) 100 MG tablet Take 2 tablets (200 mg total) by mouth daily. 09/27/16   Cloria Spring, MD  traZODone (DESYREL) 100 MG tablet Take 2 tablets (200 mg total) by mouth at bedtime. 09/27/16   Cloria Spring, MD  vitamin B-12 (CYANOCOBALAMIN) 1000 MCG tablet Take 1,000 mcg by mouth daily.    [provider]    Family History Family History  Problem Relation Age of Onset  . Heart attack Mother        HTN  . Pneumonia Father   . Kidney failure Father   . Diabetes Father   . Pancreatic cancer Sister   . Diabetes Brother   . Hypertension Brother   . Diabetes Brother   . Cancer Sister        breast   . Hypertension Son   . Sleep apnea Son   . Cancer Sister        pancreatic  . Stroke Maternal Grandmother   . Heart attack Maternal Grandfather   . Alcohol abuse Maternal Uncle   . Colon cancer Neg Hx   . Anesthesia problems Neg Hx   . Hypotension Neg Hx   . Malignant hyperthermia Neg Hx   . Pseudochol deficiency Neg Hx     Social History Social History  Substance Use Topics  . Smoking status: Light Tobacco Smoker    Packs/day: 0.25    Years: 7.00    Types: Cigarettes  . Smokeless tobacco: Never Used     Comment: continues to smoke 1/4 pack a day   . Alcohol use No     Comment:  Allergies   Cephalexin; Iron; Milk-related compounds; Penicillins; and Phenazopyridine hcl   Review of Systems Review of Systems  Unable to perform ROS: Patient unresponsive     Physical Exam Updated Vital Signs BP (!) 137/59   Pulse 65   Temp (!) 96.3 F (35.7 C)   Resp 14   Ht 1.499 m (4' 11")   Wt 70.8 kg (156 lb)   SpO2 100%   BMI 31.51 kg/m   Physical Exam  Constitutional: She appears distressed.  HENT:  Head: Atraumatic.  Mucous membranes dry  Eyes:  Pupils pinpoint  Neck: Neck supple.  Cardiovascular: Normal rate.     Pulmonary/Chest: She is in respiratory distress.  Abdominal: Soft.  Neurological:  Patient nonverbal not following commands some response to sternal rub. Eyes will open eyes will not open to voice command  Skin: She is not diaphoretic.  Nursing note and vitals reviewed.    ED Treatments / Results  Labs (all labs ordered are listed, but only abnormal results are displayed) Labs Reviewed  COMPREHENSIVE METABOLIC PANEL - Abnormal; Notable for the following:       Result Value   Glucose, Bld 233 (*)    BUN 28 (*)    Creatinine, Ser 1.91 (*)    GFR calc non Af Amer 26 (*)    GFR calc Af Amer 30 (*)    All other components within normal limits  CBC WITH DIFFERENTIAL/PLATELET - Abnormal; Notable for the following:    Hemoglobin 10.2 (*)    HCT 34.2 (*)    MCV 76.2 (*)    MCH 22.7 (*)    MCHC 29.8 (*)    All other components within normal limits  URINALYSIS, ROUTINE W REFLEX MICROSCOPIC - Abnormal; Notable for the following:    Color, Urine AMBER (*)    Specific Gravity, Urine >1.030 (*)    Bilirubin Urine SMALL (*)    Ketones, ur TRACE (*)    Protein, ur 100 (*)    All other components within normal limits  BLOOD GAS, ARTERIAL - Abnormal; Notable for the following:    pH, Arterial 7.246 (*)    pCO2 arterial 54.9 (*)    pO2, Arterial 385 (*)    Acid-base deficit 3.2 (*)    All other components within normal limits  URINALYSIS, MICROSCOPIC (REFLEX) - Abnormal; Notable for the following:    Bacteria, UA MANY (*)    Squamous Epithelial / LPF 6-30 (*)    All other components within normal limits  CBG MONITORING, ED - Abnormal; Notable for the following:    Glucose-Capillary 225 (*)    All other components within normal limits  I-STAT CG4 LACTIC ACID, ED - Abnormal; Notable for the following:    Lactic Acid, Venous 3.14 (*)    All other components within normal limits  I-STAT CHEM 8, ED - Abnormal; Notable for the following:    BUN 32 (*)    Creatinine, Ser 1.90 (*)     Glucose, Bld 231 (*)    Hemoglobin 11.2 (*)    HCT 33.0 (*)    All other components within normal limits  CULTURE, BLOOD (ROUTINE X 2)  CULTURE, BLOOD (ROUTINE X 2)  URINE CULTURE  I-STAT CG4 LACTIC ACID, ED    EKG  EKG Interpretation  Date/Time:  Sunday October 15 2016 15:42:53 EDT Ventricular Rate:  60 PR Interval:  178 QRS Duration: 94 QT Interval:  438 QTC Calculation: 438 R Axis:   64 Text Interpretation:  Normal sinus rhythm Normal ECG Confirmed by [name] [phone]) on 10/15/2016 4:04:31 PM       Radiology Ct Head Wo Contrast  Result Date: 10/15/2016 CLINICAL DATA:  Pt c/o upper back pain after fall 3 days ago. Pt denies LOC upon fall. Pt's BP in triage 87/52. Pt falling asleep in triage, but easily arouses. Pt reports she doesn't know why she's so sleepy. Denies dizziness, weakness. EXAM: CT HEAD WITHOUT CONTRAST TECHNIQUE: Contiguous axial images were obtained from the base of the skull through the vertex without intravenous contrast. COMPARISON:  02/17/2016, 05/01/2015 FINDINGS: Brain: There is encephalomalacia and postsurgical change involving the frontal lobes bilaterally. 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 Minimal soft tissue is identified within this same location, less apparent without intravenous contrast. There is no significant mass effect or edema in this region. No new mass or evidence for acute infarction. There is mild central and cortical atrophy. Vascular: There is dense atherosclerotic calcification of the internal carotid arteries. Skull: Status post bifrontal craniotomy. No acute calvarial abnormality. Sinuses/Orbits: No acute finding. Other: None IMPRESSION: 1. Status post bifrontal craniotomy. 2. Soft tissue density in the region of the known planum sphenoidale meningioma shows no evidence for edema or mass effect by CT. Consider MRI for more accurate comparison. 3.  No evidence for acute injury. Electronically Signed   By: Nolon Nations M.D.   On: 10/15/2016 17:56   Dg Chest Portable 1 View  Result Date: 10/15/2016 CLINICAL DATA:  Code sepsis.  Life support line placement. EXAM: PORTABLE CHEST 1 VIEW COMPARISON:  Chest radiograph September 10, 2016 FINDINGS: Endotracheal tube tip projects 2.2 cm above the carina. Nasogastric tube past GE junction, distal tip not included. Patient rotated to the RIGHT accentuating vascular markings. RIGHT lung base strandy densities. Low inspiratory examination without pleural effusion or focal consolidation. Stable RIGHT midlung zone scarring. No pneumothorax. Soft tissue planes and included osseous structures are unchanged. IMPRESSION: Endotracheal tube tip projects 2.2 cm above the carina. Nasogastric tube past GE junction, distal tip not included. Low inspiratory examination of the RIGHT mid and lower lung zone atelectasis/ scarring. Electronically Signed   By: Elon Alas M.D.   On: 10/15/2016 16:53    Procedures Procedures (including critical care time)  CRITICAL CARE Performed by: [name] Total critical care time: 30 minutes Critical care time was exclusive of separately billable procedures and treating other patients. Critical care was necessary to treat or prevent imminent or life-threatening deterioration. Critical care was time spent personally by me on the following activities: development of treatment plan with patient and/or surrogate as well as nursing, discussions with consultants, evaluation of patient's response to treatment, examination of patient, obtaining history from patient or surrogate, ordering and performing treatments and interventions, ordering and review of laboratory studies, ordering and review of radiographic studies, pulse oximetry and re-evaluation of patient's condition.  INTUBATION Performed by: ,  Required items: required blood products, implants, devices, and  special equipment available Patient identity confirmed: provided demographic data and hospital-assigned identification number Time out: Immediately prior to procedure a "time out" was called to verify the correct patient, procedure, equipment, support staff and site/side marked as required.  Indications:  Altered mentl status  Intubation method: Glidescope Laryngoscopy   Preoxygenation:  100%BVM  Sedatives:  20 mgEtomidate Paralytic:  70 mg Rocuronium  Tube Size:  7.5 cuffed  Post-procedure assessment: chest rise and ETCO2 monitor Breath sounds: equal and absent  over the epigastrium Tube secured with: ETT holder Chest x-ray interpreted by radiologist and me.  Chest x-ray findings: endotracheal tube in appropriate position  Patient tolerated the procedure well with no immediate complications.    Medications Ordered in ED Medications  vancomycin (VANCOCIN) IVPB 1000 mg/200 mL premix (1,000 mg Intravenous New Bag/Given 10/15/16 1757)  0.9 %  sodium chloride infusion (1,000 mLs Intravenous New Bag/Given 10/15/16 1827)  fentaNYL (SUBLIMAZE) 2,500 mcg in sodium chloride 0.9 % 250 mL (10 mcg/mL) infusion (50 mcg/hr Intravenous New Bag/Given 10/15/16 1743)  levofloxacin (LEVAQUIN) IVPB 750 mg (not administered)  aztreonam (AZACTAM) 1 g in dextrose 5 % 50 mL IVPB (not administered)  vancomycin (VANCOCIN) IVPB 750 mg/150 ml premix (not administered)  etomidate (AMIDATE) injection 20 mg (20 mg Intravenous Given 10/15/16 1615)  rocuronium (ZEMURON) injection 70 mg (70 mg Intravenous Given 10/15/16 1616)  naloxone (NARCAN) injection 0.4 mg (0.4 mg Intravenous Given 10/15/16 1600)  sodium chloride 0.9 % bolus 1,000 mL (0 mLs Intravenous Stopped 10/15/16 1808)    And  sodium chloride 0.9 % bolus 1,000 mL (0 mLs Intravenous Stopped 10/15/16 1828)    And  sodium chloride 0.9 % bolus 250 mL (0 mLs Intravenous Stopped 10/15/16 1827)  levofloxacin (LEVAQUIN) IVPB 750 mg (750 mg Intravenous New Bag/Given 10/15/16  1657)  aztreonam (AZACTAM) 2 g in dextrose 5 % 50 mL IVPB (0 g Intravenous Stopped 10/15/16 1741)     Initial Impression / Assessment and Plan / ED Course  I have reviewed the triage vital signs and the nursing notes.  Pertinent labs & imaging results that were available during my care of the patient were reviewed by me and considered in my medical decision making (see chart for details).     patient presented to the emergency department very sleepy. Not maintaining airway well.  Also hypotensive blood pressure below 90. Temperature was  Below normal.   patient required intubation due to a Glasgow Coma Scale less than 8 she received 1 L of IV fluids for the hypotension and sepsis protocol was started. Patient received broad-spectrum antibiotics based on protocol for someone that was allergic to penicillin chest x-ray without acute head CT without any acute abnormalities on the ventilator patient improved with IV fluids blood became normotensive.    lactic acid was elevated at 3 but was less than 4   patient currently blood press systolics are stable patient eyes are open and she is looking around. Still toleratator fine. No specific cause of infection identified so far. Follow-up arterial blood gas suggestive of an acidosis.  The patient first arrived she blood sugars were checked which were normal. Patient received 0.4 mg of Narcan without any change in her mental status. Following this a decision was made to intubate her due to the altered mental status and to protect her airway.   discussed with hospitalist who will admit to ICU    Final Clinical Impressions(s) / ED Diagnoses   Final diagnoses:  Altered mental status, unspecified altered mental status type  Sepsis, due to unspecified organism ([place])  Hypotension, unspecified hypotension type  Acute renal failure, unspecified acute renal failure type [place])    New Prescriptions New Prescriptions   No medications on file       [name], MD 10/15/16 1905

## 2016-10-15 NOTE — ED Notes (Signed)
Pt resting quietly with eyes shut.  Placing pt on  baer hugger.

## 2016-10-15 NOTE — ED Notes (Signed)
Received report on pt, pt sleepy, will follow commands,

## 2016-10-15 NOTE — Assessment & Plan Note (Signed)
Still;l has significant constipation with scant amt of stool in rectum. Pt to use dulcolax tabs an stool softeners followed by enema in hope that this will improve. If not, she will need to return to the ED Labs ordered per d/c summary for review, CBC and chem 7

## 2016-10-15 NOTE — ED Notes (Signed)
Son updated on patients condition.

## 2016-10-15 NOTE — ED Notes (Signed)
No response to narcan.  Pt continues hard to arrouse   B/p 94/68 in hallway bed.

## 2016-10-15 NOTE — ED Notes (Signed)
Pt opened eyes and looking around .  B/p coming up.  Starting sedation.

## 2016-10-15 NOTE — Progress Notes (Signed)
eLink Physician-Brief Progress Note Patient Name: CHARLII YOST DOB: [birth year] MRN: [id number]   Date of Service  10/15/2016  HPI/Events of Note  Intubated and ventilated - Notified of need for stress ulcer prophylaxis.   eICU Interventions  Will order Protonix IV.      Intervention Category Intermediate Interventions: Best-practice therapies (e.g. DVT, beta blocker, etc.)  Sommer,Steven Eugene 10/15/2016, 10:43 PM

## 2016-10-15 NOTE — Assessment & Plan Note (Signed)
Decompensating per pt history and recent behavior, combative telephone calls which she herself reports. Close psych follow up

## 2016-10-15 NOTE — Progress Notes (Signed)
eLink Physician-Brief Progress Note Patient Name: EVERLYNN SAGUN DOB: Dec 07, 1950 MRN: [id number]   Date of Service  10/15/2016  HPI/Events of Note  Called by bedside nurse for sedation order. Sedation order already written by Hospitalist. Spoke to Hospitalist and recommended order be changed to titrated for RASS = 0 to -1.  eICU Interventions  Further management per Hospitalist.      Intervention Category Minor Interventions: Agitation / anxiety - evaluation and management  Sommer,Steven Cornelia Copa 10/15/2016, 8:34 PM

## 2016-10-15 NOTE — Progress Notes (Signed)
Pharmacy Antibiotic Note  [name] is a 66 y.o. female admitted on 10/15/2016 with sepsis.  Pharmacy has been consulted for vancomycin, levaquin and aztreonam dosing. Initial doses ordered in the ED  Plan: Continue vanc 750 mg IV q24 hours Continue levaquin 750 mg IV q48 hours Continue aztreonam 1gm IV q8 hours F/u renal function, cultures and clinical coure  Height: 4\' 11"  (149.9 cm) Weight: 156 lb (70.8 kg) IBW/kg (Calculated) : 43.2  Temp (24hrs), Avg:97 F (36.1 C), Min:96.6 F (35.9 C), Max:97.5 F (36.4 C)   Recent Labs Lab 10/10/16 1812 10/15/16 1604 10/15/16 1605 10/15/16 1652  WBC 6.4  --   --  6.7  CREATININE 0.85  --  1.90* 1.91*  LATICACIDVEN  --  3.14*  --   --     Estimated Creatinine Clearance: 24.8 mL/min (A) (by C-G formula based on SCr of 1.91 mg/dL (H)).    Allergies  Allergen Reactions  . Cephalexin Hives  . Iron Nausea And Vomiting  . Milk-Related Compounds Other (See Comments)    Doesn't agree with stomach.   . Penicillins Hives    Has patient had a PCN reaction causing immediate rash, facial/tongue/throat swelling, SOB or lightheadedness with hypotension: Yes Has patient had a PCN reaction causing severe rash involving mucus membranes or skin necrosis: No Has patient had a PCN reaction that required hospitalization No Has patient had a PCN reaction occurring within the last 10 years: No If all of the above answers are "NO", then may proceed with Cephalosporin use.   [name] Phenazopyridine Hcl Hives           Thank you for allowing pharmacy to be a part of this patient's care.  Annex, [place] 10/15/2016 5:42 PM

## 2016-10-15 NOTE — Assessment & Plan Note (Signed)
Reports increased pain, advised use of cold compress

## 2016-10-15 NOTE — Progress Notes (Signed)
[name]     MRN: [id number]      DOB: 09-24-1950   HPI [name] is here for follow up of hospitalization from 7/01 to 09/18/2016 for SBO, which was managed conservatively. Today at the visit , she still reports scant BM, states only one  Small BM since discharge. Denies nausea or vomit, fever or chills, is eating small amounts of soft foods Denies recent fever or chills. . C/o increased  depression, anxiety and  Insomnia.States that she is decompensating mentally and needs help, is going to [employer] [facility] regularly C/o increased back pain and states this is causing her to smoke more though she wants to quit  ROS:  See HPI     PE  BP 124/74   Pulse 70   Resp 16   Ht 4\' 11"  (1.499 m)   Wt 156 lb (70.8 kg)   SpO2 96%   BMI 31.51 kg/m   Patient alert and oriented and in no cardiopulmonary distress.  HEENT: No facial asymmetry, EOMI,   oropharynx pink and moist.  Neck supple no JVD, no mass.  Chest: Clear to auscultation bilaterally.  CVS: S1, S2 no murmurs, no S3.Regular rate.  ABD: Soft, distended, reduced bS, no guarding or tenderness Rectal; no mass, heme negative stool, scant amount in rectum Ext: No edema  MS: Adequate though reduced ROM spine, shoulders, hips and knees.  Skin: Intact, no ulcerations or rash noted.  Psych: Good eye contact, both anxious and  depressed appearing.  CNS: CN 2-12 intact, power,  normal throughout.no focal deficits noted.   [facility] discharge follow-up Still;l has significant constipation with scant amt of stool in rectum. Pt to use dulcolax tabs an stool softeners followed by enema in hope that this will improve. If not, she will need to return to the ED Labs ordered per d/c summary for review, CBC and chem 7  Poorly controlled type 2 diabetes mellitus with circulatory disorder ([place]) Treated by endo [name] is reminded of the importance of commitment to daily physical activity for 30 minutes or more,  as able and the need to limit carbohydrate intake to 30 to 60 grams per meal to help with blood sugar control.   The need to take medication as prescribed, test blood sugar as directed, and to call between visits if there is a concern that blood sugar is uncontrolled is also discussed.   [name] is reminded of the importance of daily foot exam, annual eye examination, and good blood sugar, blood pressure and cholesterol control.  Diabetic Labs Latest Ref Rng & Units 10/15/2016 10/15/2016 10/10/2016 09/27/2016 09/17/2016  HbA1c - - - - - -  Microalbumin <2.0 mg/dL - - - - -  Micro/Creat Ratio 0.0 - 30.0 mg/g - - - - -  Chol 100 - 199 mg/dL - - - - -  HDL >39 mg/dL - - - - -  Calc LDL 0 - 99 mg/dL - - - - -  Triglycerides 0 - 149 mg/dL - - - - -  Creatinine 0.44 - 1.00 mg/dL 1.91(H) 1.90(H) 0.85 1.03(H) 0.73  GFR >60.00 mL/min - - - - -   BP/Weight 10/15/2016 10/10/2016 09/25/2016 09/18/2016 09/10/2016 08/09/2016 9/93/7169  Systolic BP 678 938 101 751 - 025 852  Diastolic BP 66 61 74 74 - 70 60  Wt. (Lbs) 156.09 - 156 - 158.1 157 157.6  BMI 30.48 - 31.51 - 31.93 31.71 31.83  Some encounter information is  confidential and restricted. Go to Review Flowsheets activity to see all data.   Foot/eye exam completion dates Latest Ref Rng & Units 09/25/2016 10/08/2015  Eye Exam No Retinopathy - No Retinopathy  Foot exam Order - - -  Foot Form Completion - Done -        Smoker Patient is asked and  confirms current  Nicotine use.  Five to seven minutes of time is spent in counseling the patient of the need to quit smoking  Advice to quit is delivered clearly specifically in reducing the risk of developing heart disease, having a stroke, or of developing all types of cancer, especially lung and oral cancer. Improvement in breathing and exercise tolerance and quality of life is also discussed, as is the economic benefit.  Assessment of willingness to quit or to make an attempt to quit is made and  documented  Assistance in quit attempt is made with several and varied options presented, based on patient's desire and need. These include  literature, local classes available, 1800 QUIT NOW number, OTC and prescription medication.  The GOAL to be NICOTINE FREE is re emphasized.  The patient has set a personal goal of either reduction or discontinuation and follow up is arranged between 6 an 16 weeks.    Left-sided low back pain with left-sided sciatica Reports increased pain, advised use of cold compress  Labile hypertension Controlled, no change in medication   Constipation Ongoing challenge following recent hospitalization for SBO, this is multifactorial, hopefully she will be able to correct without requiring re admission  Bipolar disorder ([place]) Decompensating per pt history and recent behavior, combative telephone calls which she herself reports. Close psych follow up

## 2016-10-15 NOTE — Assessment & Plan Note (Signed)
Treated by endo [name] is reminded of the importance of commitment to daily physical activity for 30 minutes or more, as able and the need to limit carbohydrate intake to 30 to 60 grams per meal to help with blood sugar control.   The need to take medication as prescribed, test blood sugar as directed, and to call between visits if there is a concern that blood sugar is uncontrolled is also discussed.   [name] is reminded of the importance of daily foot exam, annual eye examination, and good blood sugar, blood pressure and cholesterol control.  Diabetic Labs Latest Ref Rng & Units 10/15/2016 10/15/2016 10/10/2016 09/27/2016 09/17/2016  HbA1c - - - - - -  Microalbumin <2.0 mg/dL - - - - -  Micro/Creat Ratio 0.0 - 30.0 mg/g - - - - -  Chol 100 - 199 mg/dL - - - - -  HDL >39 mg/dL - - - - -  Calc LDL 0 - 99 mg/dL - - - - -  Triglycerides 0 - 149 mg/dL - - - - -  Creatinine 0.44 - 1.00 mg/dL 1.91(H) 1.90(H) 0.85 1.03(H) 0.73  GFR >60.00 mL/min - - - - -   BP/Weight 10/15/2016 10/10/2016 09/25/2016 09/18/2016 09/10/2016 08/09/2016 7/54/4920  Systolic BP 100 712 197 588 - 325 498  Diastolic BP 66 61 74 74 - 70 60  Wt. (Lbs) 156.09 - 156 - 158.1 157 157.6  BMI 30.48 - 31.51 - 31.93 31.71 31.83  Some encounter information is confidential and restricted. Go to Review Flowsheets activity to see all data.   Foot/eye exam completion dates Latest Ref Rng & Units 09/25/2016 10/08/2015  Eye Exam No Retinopathy - No Retinopathy  Foot exam Order - - -  Foot Form Completion - Done -

## 2016-10-15 NOTE — ED Notes (Signed)
Preparing to intubate.  Pt remains difficult to arrouse

## 2016-10-15 NOTE — Assessment & Plan Note (Signed)
Controlled, no change in medication  

## 2016-10-15 NOTE — ED Triage Notes (Addendum)
Pt c/o upper back pain after fall 3 days ago. Pt denies LOC upon fall.  Pt's BP in triage 87/52. Pt falling asleep in triage, but easily arouses. Pt reports she doesn't know why she's so sleepy. Denies dizziness, weakness.

## 2016-10-15 NOTE — Assessment & Plan Note (Signed)
Patient is asked and  confirms current  Nicotine use.  Five to seven minutes of time is spent in counseling the patient of the need to quit smoking  Advice to quit is delivered clearly specifically in reducing the risk of developing heart disease, having a stroke, or of developing all types of cancer, especially lung and oral cancer. Improvement in breathing and exercise tolerance and quality of life is also discussed, as is the economic benefit.  Assessment of willingness to quit or to make an attempt to quit is made and documented  Assistance in quit attempt is made with several and varied options presented, based on patient's desire and need. These include  literature, local classes available, 1800 QUIT NOW number, OTC and prescription medication.  The GOAL to be NICOTINE FREE is re emphasized.  The patient has set a personal goal of either reduction or discontinuation and follow up is arranged between 6 an 16 weeks.   

## 2016-10-15 NOTE — H&P (Signed)
History and Physical    [name] INO:[id number] DOB: 12/29/1950 DOA: 10/15/2016  PCP: [name], MD  Patient coming from: home  Chief Complaint:   sleepy  HPI: [name] is a 66 y.o. female with medical history significant of COPD, OSA, CVA with unclear hemiplegia per chart, IDDM, bipolar disorder dropped off in the ED waiting area by family  Members earlier today for altered mental status.  Pt was found to be sleepy hypotensive and was intubated in the ED for airway protection.  No history can be obtained from patient and no family present.  Pt is lightly sedated at this time, she arouses to voice and her ble seem to have muscle wasting , unknown if she is chronically bedbound or what her baseline functional status is.  Her bp has normalized with 30cc/kg ivf bolus in ED per sepsis protocol.  Pt referred for admission for acute respiratory failure of unclear etiology.   Review of Systems: unobtainable due to sedation/intubation  Past Medical History:  Diagnosis Date  . Anemia   . Anxiety    takes Ativan daily  . Arthritis   . Bipolar disorder ([place])    takes Risperdal nightly  . Blood transfusion   . Cancer ([place])    In her gum  . Carpal tunnel syndrome of right wrist 05/23/2011  . Cervical disc disorder with radiculopathy of cervical region 10/31/2012  . Chronic back pain   . Chronic idiopathic constipation   . Colon polyps   . COPD (chronic obstructive pulmonary disease) with chronic bronchitis ([place]) 09/16/2013   Office Spirometry 10/30/2013-submaximal effort based on appearance of loop and curve. Numbers would fit with severe restriction but her physiologic capability may be better than this. FVC 0.91/44%, and 10.74/45%, FEV1/FVC 0.81, FEF 25-75% 1.43/69%    . Depression    takes Zoloft daily  . Diabetes mellitus    Type II  . Diverticulosis    TCS 9/08 by Dr. Delfin Edis for diarrhea . Bx for micro scopic colitis negative.   . Fibromyalgia   . Frequent falls   .  GERD (gastroesophageal reflux disease)    takes Aciphex daily  . Glaucoma    eye drops daily  . Gum symptoms    infection on antibiotic  . Hemiplegia affecting non-dominant side, post-stroke 08/02/2011  . Hyperlipidemia    takes Crestor daily  . Hypertension    takes Amlodipine,Metoprolol,and Clonidine daily  . Hypothyroidism    takes Synthroid daily  . IBS (irritable bowel syndrome)   . Insomnia    takes Trazodone nightly  . Metabolic encephalopathy 0/96/2836  . Migraines    chronic headaches  . Mononeuritis lower limb   . Osteoporosis   . Pancreatitis 2006   due to Depakote with normal EUS   . Schatzki's ring    non critical / EGD with ED 8/2011with RMR  . Seizures ([place])    takes Lamictal daily.Last seizure 3 yrs ago  . Sleep apnea    on CPAP  . Stroke [facility]    left sided weakness  . Tubular adenoma of colon     Past Surgical History:  Procedure Laterality Date  . ABDOMINAL HYSTERECTOMY  1978  . BACK SURGERY  July 2012  . BACTERIAL OVERGROWTH TEST N/A 05/05/2013   Procedure: BACTERIAL OVERGROWTH TEST;  Surgeon: Daneil Dolin, MD;  [facility];  Service: Endoscopy;  Laterality: N/A;  7:30  . BIOPSY THYROID  2009  . BRAIN SURGERY  11/2011   resection of meningioma  . BREAST REDUCTION SURGERY  1994  . CARDIAC CATHETERIZATION  05/10/2005   normal coronaries, normal LV systolic function and EF (Dr. Jackie Plum)  . CARPAL TUNNEL RELEASE Left 07/22/04   Dr. Aline Brochure  . CATARACT EXTRACTION Bilateral   . CHOLECYSTECTOMY  1984  . COLONOSCOPY N/A 09/25/2012   QAS:[id number] diverticulosis.  colonic polyp-removed : tubular adenoma  . CRANIOTOMY  11/23/2011   Procedure: CRANIOTOMY TUMOR EXCISION;  Surgeon: Hosie Spangle, MD;  Location: [place] [facility] OR[facility];  Service: Neurosurgery;  Laterality: N/A;  Craniotomy for tumor resection  . ESOPHAGOGASTRODUODENOSCOPY  12/29/2010   [place]ourk-Retained food in the esophagus and stomach, small hiatal hernia, status post Maloney dilation of  the esophagus  . ESOPHAGOGASTRODUODENOSCOPY N/A 09/25/2012   WLN:[id number] atonic baggy esophagus status post Maloney dilation 17 F. Hiatal hernia  . GIVENS CAPSULE STUDY N/A 01/15/2013   NORMAL.   . IR GENERIC HISTORICAL  03/17/2016   IR RADIOLOGIST EVAL & MGMT 03/17/2016 [facility]-INTER[facility] RAD  . LESION REMOVAL N/A 05/31/2015   Procedure: REMOVAL RIGHT AND LEFT LESIONS OF MANDIBLE;  Surgeon: Diona Browner, DDS;  Location: [place];  Service: Oral Surgery;  Laterality: N/A;  . MALONEY DILATION  12/29/2010   RMR;  . NM MYOCAR PERF WALL MOTION  2006   "relavtiely normal" persantine, mild anterior thinning (breast attenuation artifact), no region of scar/ischemia  . OVARIAN CYST REMOVAL    . RECTOCELE REPAIR N/A 06/29/2015   Procedure: POSTERIOR REPAIR (RECTOCELE);  Surgeon: Jonnie Kind, MD;  Location: [facility];  Service: Gynecology;  Laterality: N/A;  . REDUCTION MAMMAPLASTY Bilateral   . SPINE SURGERY  09/29/2010   Dr. Rolena Infante  . surgical excision of 3 tumors from right thigh and right buttock  and left upper thigh  2010  . TOOTH EXTRACTION Bilateral 12/14/2014   Procedure: REMOVAL OF BILATERAL MANDIBULAR EXOSTOSES;  Surgeon: Diona Browner, DDS;  Location: [place];  Service: Oral Surgery;  Laterality: Bilateral;  . TRANSTHORACIC ECHOCARDIOGRAM  2010   EF 60-65%, mild conc LVH, grade 1 diastolic dysfunction; mildly calcified MV annulus with mildly thickened leaflets, mildly calcified MR annulus     reports that she has been smoking Cigarettes.  She has a 1.75 pack-year smoking history. She has never used smokeless tobacco. She reports that she does not drink alcohol or use drugs.  Allergies  Allergen Reactions  . Cephalexin Hives  . Iron Nausea And Vomiting  . Milk-Related Compounds Other (See Comments)    Doesn't agree with stomach.   . Penicillins Hives    Has patient had a PCN reaction causing immediate rash, facial/tongue/throat swelling, SOB or lightheadedness with hypotension: Yes Has patient had a  PCN reaction causing severe rash involving mucus membranes or skin necrosis: No Has patient had a PCN reaction that required hospitalization No Has patient had a PCN reaction occurring within the last 10 years: No If all of the above answers are "NO", then may proceed with Cephalosporin use.   [name] Phenazopyridine Hcl Hives          Family History  Problem Relation Age of Onset  . Heart attack Mother        HTN  . Pneumonia Father   . Kidney failure Father   . Diabetes Father   . Pancreatic cancer Sister   . Diabetes Brother   . Hypertension Brother   . Diabetes Brother   . Cancer Sister        breast   .  Hypertension Son   . Sleep apnea Son   . Cancer Sister        pancreatic  . Stroke Maternal Grandmother   . Heart attack Maternal Grandfather   . Alcohol abuse Maternal Uncle   . Colon cancer Neg Hx   . Anesthesia problems Neg Hx   . Hypotension Neg Hx   . Malignant hyperthermia Neg Hx   . Pseudochol deficiency Neg Hx     Prior to Admission medications   Medication Sig Start Date End Date Taking? Authorizing Provider  ACCU-CHEK FASTCLIX LANCETS MISC Use to test blood sugar 4 times daily. Dx: E10.65 12/03/14   Philemon Kingdom, MD  ACCU-CHEK SMARTVIEW test strip TEST BLOOD SUGAR FOUR TIMES DAILY AS DIRECTED 09/19/16   Philemon Kingdom, MD  albuterol (PROVENTIL HFA;VENTOLIN HFA) 108 (90 Base) MCG/ACT inhaler Inhale 1-2 puffs every 6 hours as needed for wheezing, shortness of breath 07/27/16   Baird Lyons D, MD  Alcohol Swabs (B-D SINGLE USE SWABS REGULAR) PADS Use for injections and glucose testing 8 times daily. Dx: E10.65 12/03/14   Philemon Kingdom, MD  amLODipine (NORVASC) 5 MG tablet TAKE 2 TABLETS BY MOUTH ONCE DAILY. 09/25/16   Philemon Kingdom, MD  aspirin EC 81 MG tablet Take 81 mg by mouth daily.    [provider]  Azelastine-Fluticasone (DYMISTA) 137-50 MCG/ACT SUSP Place 1 puff into the nose at bedtime. 07/27/15   Deneise Lever, MD  BD PEN NEEDLE  NANO U/F 32G X 4 MM MISC USE FOUR TIMES DAILY 04/20/15   Philemon Kingdom, MD  Blood Glucose Calibration (ACCU-CHEK SMARTVIEW CONTROL) LIQD 1 each by Other route as needed. 12/03/14   Philemon Kingdom, MD  Blood Glucose Monitoring Suppl (ACCU-CHEK NANO SMARTVIEW) W/DEVICE KIT 1 each by Does not apply route daily. Dx: E10.65 Patient not taking: Reported on 09/27/2016 12/03/14   Philemon Kingdom, MD  Cholecalciferol (VITAMIN D) 2000 units CAPS Take 1 capsule by mouth daily.    [provider]  clobetasol cream (TEMOVATE) 2.22 % Apply 1 application topically daily.    [provider]  cloNIDine (CATAPRES) 0.3 MG tablet TAKE 1 TABLET BY MOUTH EVERY EIGHT HOURS. ONCE AT 8AM, 4PM, AND 12 MIDNIGHT. 08/30/16   [name], MD  Coenzyme Q-10 100 MG capsule Take 1 capsule (100 mg total) by mouth 3 (three) times daily. 08/09/16   Pieter Partridge, DO  diclofenac sodium (VOLTAREN) 1 % GEL Apply 2 g topically daily as needed (Pain). 07/21/16   [name], MD  hydroxychloroquine (PLAQUENIL) 200 MG tablet Take 200 mg by mouth daily.    [provider]  Insulin Glargine (TOUJEO SOLOSTAR) 300 UNIT/ML SOPN Inject 25 Units into the skin at bedtime. 06/27/16   Philemon Kingdom, MD  lamoTRIgine (LAMICTAL) 100 MG tablet Take 1 tablet (100 mg total) by mouth 2 (two) times daily. 08/30/16   Pieter Partridge, DO  levothyroxine (SYNTHROID, LEVOTHROID) 50 MCG tablet TAKE 1 TABLET BY MOUTH ONCE DAILY AND 1/2 TABLET ON SUNDAYS. 05/02/16   Philemon Kingdom, MD  LORazepam (ATIVAN) 0.5 MG tablet Take 1 tablet (0.5 mg total) by mouth 3 (three) times daily. 09/27/16   Cloria Spring, MD  losartan (COZAAR) 50 MG tablet TAKE ONE TABLET BY MOUTH DAILY. 07/04/16   [name], MD  metoprolol tartrate (LOPRESSOR) 50 MG tablet Take 1 tablet (50 mg total) by mouth 2 (two) times daily. Please call and schedule appointment 09/26/16   Pixie Casino, MD  montelukast (  SINGULAIR) 10 MG tablet TAKE ONE  TABLET BY MOUTH ONCE DAILY. 08/30/16   [name], MD  Multiple Vitamins-Minerals (ONE-A-DAY 50 PLUS PO) Take 1 tablet by mouth daily.    [provider]  NOVOLOG FLEXPEN 100 UNIT/ML FlexPen INJECT 16 TO 30 UNITS INTO THE SKIN 3 (THREE) TIMES DAILY WITH MEALS. 01/06/16   Philemon Kingdom, MD  nystatin (MYCOSTATIN/NYSTOP) powder APPLY TO AFFECTED AREA FOUR TIMES DAILY. 04/03/16   [name], MD  ondansetron (ZOFRAN ODT) 8 MG disintegrating tablet Take 1 tablet (8 mg total) by mouth every 8 (eight) hours as needed for nausea or vomiting. 10/10/16   Jola Schmidt, MD  pregabalin (LYRICA) 75 MG capsule Take 75 mg by mouth 2 (two) times daily.  10/26/11   Carole Civil, MD  RABEprazole (ACIPHEX) 20 MG tablet Take 1 tablet (20 mg total) by mouth 2 (two) times daily. 06/12/16   Pyrtle, Lajuan Lines, MD  RESTASIS 0.05 % ophthalmic emulsion Place 1 drop into both eyes 2 (two) times daily.  02/04/14   [provider]  Riboflavin 400 MG TABS Take 400 mg by mouth daily. Patient not taking: Reported on 09/27/2016 08/09/16   Pieter Partridge, DO  risperiDONE (RISPERDAL) 0.5 MG tablet Take 1 tablet (0.5 mg total) by mouth at bedtime. 09/27/16   Cloria Spring, MD  rosuvastatin (CRESTOR) 5 MG tablet TAKE 1 TABLET BY MOUTH AT BEDTIME. 08/30/16   [name], MD  sertraline (ZOLOFT) 100 MG tablet Take 2 tablets (200 mg total) by mouth daily. 09/27/16   Cloria Spring, MD  traZODone (DESYREL) 100 MG tablet Take 2 tablets (200 mg total) by mouth at bedtime. 09/27/16   Cloria Spring, MD  vitamin B-12 (CYANOCOBALAMIN) 1000 MCG tablet Take 1,000 mcg by mouth daily.    [provider]    Physical Exam: Vitals:   10/15/16 1900 10/15/16 1915 10/15/16 1918 10/15/16 1930  BP: (!) 137/57 (!) 144/56  (!) 168/66  Pulse: 65 66  78  Resp: _0 Temp: (!) 96.6 F (35.9 C)     TempSrc:      SpO2: 100% 100%  100%  Weight:      Height:   _1  (1.499 m)     Constitutional:  NAD, calm, comfortable, mildly sedated and intubated  Vitals:   10/15/16 1900 10/15/16 1915 10/15/16 1918 10/15/16 1930  BP: (!) 137/57 (!) 144/56  (!) 168/66  Pulse: 65 66  78  Resp: _2 Temp: (!) 96.6 F (35.9 C)     TempSrc:      SpO2: 100% 100%  100%  Weight:      Height:   _3  (1.499 m)    Eyes: PERRL, lids and conjunctivae normal ENMT: Mucous membranes are moist. Posterior pharynx clear of any exudate or lesions.Normal dentition.  Neck: normal, supple, no masses, no thyromegaly Respiratory:  Rhonchi bilaterally with good air movement.  No crackles, rales or wheezing Cardiovascular: Regular rate and rhythm, no murmurs / rubs / gallops. No extremity edema. 2+ pedal pulses. No carotid bruits.  Abdomen: no tenderness, no masses palpated. No hepatosplenomegaly. Bowel sounds positive.  Musculoskeletal: no clubbing / cyanosis.  Decreased muscle mass in ble with no clear contraction Skin: no rashes, lesions, ulcers. No induration Neurologic: sedated, opens eyes to voice, can squeeze hands equally bilateral, o/w does not follow commands to move ble, but reported earlier in ED when not sedated moving all  extremities Psychiatric: sedated, intubated  Labs on Admission: I have personally reviewed following labs and imaging studies  CBC:  Recent Labs Lab 10/10/16 1812 10/15/16 1605 10/15/16 1652  WBC 6.4  --  6.7  NEUTROABS 3.8  --  3.9  HGB 12.3 11.2* 10.2*  HCT 40.7 33.0* 34.2*  MCV 75.4*  --  76.2*  PLT 246  --  546   Basic Metabolic Panel:  Recent Labs Lab 10/10/16 1812 10/15/16 1605 10/15/16 1652  NA 146* 143 144  K 3.5 3.6 3.5  CL 112* 105 107  CO2 23  --  25  GLUCOSE 212* 231* 233*  BUN 14 32* 28*  CREATININE 0.85 1.90* 1.91*  CALCIUM 9.9  --  9.2   GFR: Estimated Creatinine Clearance: 24.8 mL/min (A) (by C-G formula based on SCr of 1.91 mg/dL (H)). Liver Function Tests:  Recent Labs Lab 10/10/16 1812 10/15/16 1652  AST 36 29  ALT 29 22    ALKPHOS 151* 96  BILITOT 0.3 0.4  PROT 8.6* 6.9  ALBUMIN 4.4 3.7    Recent Labs Lab 10/10/16 1828  LIPASE 25   CBG:  Recent Labs Lab 10/15/16 1545  GLUCAP 225*   Urine analysis:    Component Value Date/Time   COLORURINE AMBER (A) 10/15/2016 1622   APPEARANCEUR CLEAR 10/15/2016 1622   LABSPEC >1.030 (H) 10/15/2016 1622   PHURINE 6.0 10/15/2016 1622   GLUCOSEU NEGATIVE 10/15/2016 1622   HGBUR NEGATIVE 10/15/2016 1622   HGBUR negative 03/29/2010 0834   BILIRUBINUR SMALL (A) 10/15/2016 1622   BILIRUBINUR negative 10/25/2015 1044   KETONESUR TRACE (A) 10/15/2016 1622   PROTEINUR 100 (A) 10/15/2016 1622   UROBILINOGEN 0.2 10/25/2015 1044   UROBILINOGEN 0.2 06/02/2014 0140   NITRITE NEGATIVE 10/15/2016 1622   LEUKOCYTESUR NEGATIVE 10/15/2016 1622   Radiological Exams on Admission: Ct Head Wo Contrast  Result Date: 10/15/2016 CLINICAL DATA:  Pt c/o upper back pain after fall 3 days ago. Pt denies LOC upon fall. Pt's BP in triage 87/52. Pt falling asleep in triage, but easily arouses. Pt reports she doesn't know why she's so sleepy. Denies dizziness, weakness. EXAM: CT HEAD WITHOUT CONTRAST TECHNIQUE: Contiguous axial images were obtained from the base of the skull through the vertex without intravenous contrast. COMPARISON:  02/17/2016, 05/01/2015 FINDINGS: Brain: There is encephalomalacia and postsurgical change involving the frontal lobes bilaterally. 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 Minimal soft tissue is identified within this same location, less apparent without intravenous contrast. There is no significant mass effect or edema in this region. No new mass or evidence for acute infarction. There is mild central and cortical atrophy. Vascular: There is dense atherosclerotic calcification of the internal carotid arteries. Skull: Status post bifrontal craniotomy. No acute calvarial  abnormality. Sinuses/Orbits: No acute finding. Other: None IMPRESSION: 1. Status post bifrontal craniotomy. 2. Soft tissue density in the region of the known planum sphenoidale meningioma shows no evidence for edema or mass effect by CT. Consider MRI for more accurate comparison. 3. No evidence for acute injury. Electronically Signed   By: Nolon Nations M.D.   On: 10/15/2016 17:56   Dg Chest Portable 1 View  Result Date: 10/15/2016 CLINICAL DATA:  Code sepsis.  Life support line placement. EXAM: PORTABLE CHEST 1 VIEW COMPARISON:  Chest radiograph September 10, 2016 FINDINGS: Endotracheal tube tip projects 2.2 cm above the carina. Nasogastric tube past GE junction, distal tip not included.  Patient rotated to the RIGHT accentuating vascular markings. RIGHT lung base strandy densities. Low inspiratory examination without pleural effusion or focal consolidation. Stable RIGHT midlung zone scarring. No pneumothorax. Soft tissue planes and included osseous structures are unchanged. IMPRESSION: Endotracheal tube tip projects 2.2 cm above the carina. Nasogastric tube past GE junction, distal tip not included. Low inspiratory examination of the RIGHT mid and lower lung zone atelectasis/ scarring. Electronically Signed   By: Elon Alas M.D.   On: 10/15/2016 16:53    EKG: Independently reviewed.  nsr no acute issues Old chart reviewed Case discussed with dr Sarajane Jews cxr reviewed no edema or infiltrate  Assessment/Plan 66 yo female with multiple medical problems comes in with acute encephalopathy, acute respiratory failure secondary to unclear etiology  Principal Problem:   Acute respiratory failure with hypercapnia (HCC) requiring ventilatory support- pt not significantly hypercapnic to explain resp failure.  She did not respond to narcan in the ED.  Not hypoxic.  Was hypotensive and mildly hypothermic on arrival.  Raises question of sepsis, ua may represent uti but also points to dehydration with AKI.  Pt  certainly on very many medications chronically and this list is yet to be clarified.  Has seizure disorder and bipolar disorder further making ddx broad.  Will cover empirically with broad abx until cx data available with vanc/azactam/levaquin (pcn allergic).  Lactic acid level was mildly elevated above 3 down to normal after bolus.  Check procalcitonin level.  Ck ammonia level.  Repeat abg in a couple hours. Ck EEG in am along with brain MRI due to significant h/o sz and previous cva.  Ck uds and etoh level.  Serial trop.  Check ddimer (pt not hypoxic but she appears to be chronically bedbound but this is unclear).  Ct head neg for any acute issue or bleed at this point.  Provide solumedrol also.  Repeat cxr in am (initial no clear infiltrate).  ekg nonischemic.   Active Problems:   Polypharmacy- holding all meds at this time   Seizure disorder ([place])- EEG in am as above.     COPD (chronic obstructive pulmonary disease) with chronic bronchitis (HCC)- no wheezing on exam, giving steroids due to severity of illness at this time   Acute metabolic encephalopathy- as above, check uds   Bipolar disorder ([place])- unknown what meds she is on, check lithium level   Hemiplegia and hemiparesis following cerebral infarction affecting unspecified side ([place])- noted   AKI (acute kidney injury) ([place])- cr done in last year was normal.  Ivf.  Foley cath.  Repeat in am   Obstructive sleep apnea- noted   Gastroparesis- noted   Microcytic anemia- anemia panel checked and pending   Pulmonary hypertension (HCC)- noted, holding all bp meds at this time due to hypotension on arrival   Poorly controlled type 2 diabetes mellitus with circulatory disorder ([place])- q 4 hour glucose and ssi, not in DKA   Labile hypertension- as above   Class 1 obesity with serious comorbidity and body mass index (BMI) of 33.0 to 33.9 in adult- noted     DVT prophylaxis:  Lovenox, ssds  Code Status:  full Family Communication: none, will attempt  if family arrives  Disposition Plan:  Per day team Consults called:  none Admission status:  admission  Cc time 65 minutes Expect pt to be weaned off of vent and extubated within the next 24 -48 hours. Discussed with ICU RN and RT in ICU  Alphonsine Minium A MD [facility]  If  7PM-7AM, please contact night-coverage [email or web address] Password [facility]  10/15/2016, 7:57 PM

## 2016-10-16 ENCOUNTER — Inpatient Hospital Stay (HOSPITAL_COMMUNITY): Payer: Medicare HMO

## 2016-10-16 ENCOUNTER — Ambulatory Visit: Payer: Self-pay

## 2016-10-16 DIAGNOSIS — E039 Hypothyroidism, unspecified: Secondary | ICD-10-CM

## 2016-10-16 DIAGNOSIS — K219 Gastro-esophageal reflux disease without esophagitis: Secondary | ICD-10-CM

## 2016-10-16 DIAGNOSIS — K3184 Gastroparesis: Secondary | ICD-10-CM

## 2016-10-16 DIAGNOSIS — K5900 Constipation, unspecified: Secondary | ICD-10-CM

## 2016-10-16 DIAGNOSIS — J189 Pneumonia, unspecified organism: Secondary | ICD-10-CM | POA: Diagnosis present

## 2016-10-16 DIAGNOSIS — E1142 Type 2 diabetes mellitus with diabetic polyneuropathy: Secondary | ICD-10-CM

## 2016-10-16 LAB — BLOOD GAS, ARTERIAL
Acid-base deficit: 5.2 mmol/L — ABNORMAL HIGH (ref 0.0–2.0)
BICARBONATE: 19.8 mmol/L — AB (ref 20.0–28.0)
DRAWN BY: 270161
FIO2: 40
O2 Saturation: 97.5 %
PATIENT TEMPERATURE: 37.1
PEEP: 5 cmH2O
PO2 ART: 112 mmHg — AB (ref 83.0–108.0)
PRESSURE SUPPORT: 10 cmH2O
pCO2 arterial: 45.3 mmHg (ref 32.0–48.0)
pH, Arterial: 7.278 — ABNORMAL LOW (ref 7.350–7.450)

## 2016-10-16 LAB — GLUCOSE, CAPILLARY
GLUCOSE-CAPILLARY: 182 mg/dL — AB (ref 65–99)
GLUCOSE-CAPILLARY: 122 mg/dL — AB (ref 65–99)
GLUCOSE-CAPILLARY: 129 mg/dL — AB (ref 65–99)
GLUCOSE-CAPILLARY: 159 mg/dL — AB (ref 65–99)
Glucose-Capillary: 140 mg/dL — ABNORMAL HIGH (ref 65–99)
Glucose-Capillary: 144 mg/dL — ABNORMAL HIGH (ref 65–99)

## 2016-10-16 LAB — TROPONIN I: Troponin I: <0.03 ng/mL (ref <0.03)

## 2016-10-16 LAB — COMPREHENSIVE METABOLIC PANEL
ALBUMIN: 3.2 g/dL — AB (ref 3.5–5.0)
ALT: 19 U/L (ref 14–54)
ANION GAP: 8 (ref 5–15)
AST: 27 U/L (ref 15–41)
Alkaline Phosphatase: 86 U/L (ref 38–126)
BILIRUBIN TOTAL: 0.3 mg/dL (ref 0.3–1.2)
BUN: 19 mg/dL (ref 6–20)
CO2: 20 mmol/L — AB (ref 22–32)
Calcium: 8.3 mg/dL — ABNORMAL LOW (ref 8.9–10.3)
Chloride: 112 mmol/L — ABNORMAL HIGH (ref 101–111)
Creatinine, Ser: 0.93 mg/dL (ref 0.44–1.00)
GFR calc Af Amer: >60 mL/min (ref >60)
GFR calc non Af Amer: >60 mL/min (ref >60)
GLUCOSE: 185 mg/dL — AB (ref 65–99)
POTASSIUM: 4.1 mmol/L (ref 3.5–5.1)
SODIUM: 140 mmol/L (ref 135–145)
TOTAL PROTEIN: 6.2 g/dL — AB (ref 6.5–8.1)

## 2016-10-16 LAB — CBC
HEMATOCRIT: 31.7 % — AB (ref 36.0–46.0)
HEMOGLOBIN: 9.5 g/dL — AB (ref 12.0–15.0)
MCH: 22.8 pg — ABNORMAL LOW (ref 26.0–34.0)
MCHC: 30 g/dL (ref 30.0–36.0)
MCV: 76 fL — AB (ref 78.0–100.0)
Platelets: 217 10*3/uL (ref 150–400)
RBC: 4.17 MIL/uL (ref 3.87–5.11)
RDW: 14.2 % (ref 11.5–15.5)
WBC: 5.3 10*3/uL (ref 4.0–10.5)

## 2016-10-16 LAB — IRON AND TIBC
IRON: 53 ug/dL (ref 28–170)
SATURATION RATIOS: 13 % (ref 10.4–31.8)
TIBC: 400 ug/dL (ref 250–450)
UIBC: 347 ug/dL

## 2016-10-16 LAB — AMMONIA: Ammonia: 40 umol/L — ABNORMAL HIGH (ref 9–35)

## 2016-10-16 LAB — FOLATE: FOLATE: 52.2 ng/mL (ref >5.9)

## 2016-10-16 LAB — VITAMIN B12: VITAMIN B 12: 2322 pg/mL — AB (ref 180–914)

## 2016-10-16 LAB — FERRITIN: Ferritin: 37 ng/mL (ref 11–307)

## 2016-10-16 LAB — LACTIC ACID, PLASMA: LACTIC ACID, VENOUS: 2.5 mmol/L — AB (ref 0.5–1.9)

## 2016-10-16 MED ORDER — BUDESONIDE 0.5 MG/2ML IN SUSP
0.5000 mg | Freq: Two times a day (BID) | RESPIRATORY_TRACT | Status: DC
  Administered 2016-10-16 – 2016-10-20 (×9): 0.5 mg via RESPIRATORY_TRACT
  Filled 2016-10-16 (×10): qty 2

## 2016-10-16 MED ORDER — METOPROLOL TARTRATE 5 MG/5ML IV SOLN
5.0000 mg | Freq: Three times a day (TID) | INTRAVENOUS | Status: DC
  Administered 2016-10-16 – 2016-10-20 (×13): 5 mg via INTRAVENOUS
  Filled 2016-10-16 (×13): qty 5

## 2016-10-16 MED ORDER — BISACODYL 10 MG RE SUPP
10.0000 mg | Freq: Once | RECTAL | Status: AC
  Administered 2016-10-16: 10 mg via RECTAL
  Filled 2016-10-16: qty 1

## 2016-10-16 MED ORDER — IPRATROPIUM-ALBUTEROL 0.5-2.5 (3) MG/3ML IN SOLN
3.0000 mL | RESPIRATORY_TRACT | Status: DC
  Administered 2016-10-16 – 2016-10-17 (×9): 3 mL via RESPIRATORY_TRACT
  Filled 2016-10-16 (×9): qty 3

## 2016-10-16 MED ORDER — IPRATROPIUM-ALBUTEROL 0.5-2.5 (3) MG/3ML IN SOLN: 3.0000 mL | RESPIRATORY_TRACT | Status: DC | PRN

## 2016-10-16 MED ORDER — ENOXAPARIN SODIUM 40 MG/0.4ML ~~LOC~~ SOLN
40.0000 mg | SUBCUTANEOUS | Status: DC
  Administered 2016-10-16 – 2016-10-19 (×4): 40 mg via SUBCUTANEOUS
  Filled 2016-10-16 (×4): qty 0.4

## 2016-10-16 MED ORDER — LACTULOSE 10 GM/15ML PO SOLN
30.0000 g | Freq: Two times a day (BID) | ORAL | Status: AC
  Administered 2016-10-16 – 2016-10-18 (×4): 30 g via ORAL
  Filled 2016-10-16 (×4): qty 60

## 2016-10-16 MED ORDER — SODIUM CHLORIDE 0.9 % IV BOLUS (SEPSIS)
500.0000 mL | Freq: Once | INTRAVENOUS | Status: AC
  Administered 2016-10-16: 500 mL via INTRAVENOUS

## 2016-10-16 MED ORDER — LEVOTHYROXINE SODIUM 100 MCG IV SOLR
25.0000 ug | Freq: Every day | INTRAVENOUS | Status: DC
  Administered 2016-10-16 – 2016-10-20 (×5): 25 ug via INTRAVENOUS
  Filled 2016-10-16 (×8): qty 5

## 2016-10-16 MED ORDER — LEVOFLOXACIN IN D5W 750 MG/150ML IV SOLN
750.0000 mg | INTRAVENOUS | Status: DC
  Administered 2016-10-16 – 2016-10-19 (×4): 750 mg via INTRAVENOUS
  Filled 2016-10-16 (×4): qty 150

## 2016-10-16 NOTE — Progress Notes (Signed)
Attempted CPAP/PSV at this time with patient. I started her on 5/5. The patient was still under light sedation at this time and did not obtain adequate tidal volumes nor minute ventilation. I spoke with Dr Luan Pulling at this time and he agreed that we should attempt again later. I placed patient back on full support ventilation at this time. She is resting and ventilating fine. RT will continue to monitor patient for weaning.

## 2016-10-16 NOTE — Progress Notes (Signed)
Pharmacy Antibiotic Note  [name] is a 66 y.o. female admitted on 10/15/2016 with sepsis.  Pharmacy has been consulted for Levaquin and Aztreonam dosing.  Plan: Vancomycin stopped per MD. Change Levaquin to 750 mg IV q24 hours Continue Aztreonam 1gm IV q8 hours F/u renal function, cultures and clinical coure  Height: 5' (152.4 cm) Weight: 156 lb 4.9 oz (70.9 kg) IBW/kg (Calculated) : 45.5  Temp (24hrs), Avg:98 F (36.7 C), Min:96.3 F (35.7 C), Max:98.8 F (37.1 C)   Recent Labs Lab 10/10/16 1812 10/15/16 1604 10/15/16 1605 10/15/16 1652 10/15/16 1917 10/15/16 2030 10/16/16 0332  WBC 6.4  --   --  6.7  --   --  5.3  CREATININE 0.85  --  1.90* 1.91*  --   --  0.93  LATICACIDVEN  --  3.14*  --   --  1.38 1.7 2.5*    Estimated Creatinine Clearance: 52.3 mL/min (by C-G formula based on SCr of 0.93 mg/dL).    Allergies  Allergen Reactions  . Cephalexin Hives  . Iron Nausea And Vomiting  . Milk-Related Compounds Other (See Comments)    Doesn't agree with stomach.   . Penicillins Hives    Has patient had a PCN reaction causing immediate rash, facial/tongue/throat swelling, SOB or lightheadedness with hypotension: Yes Has patient had a PCN reaction causing severe rash involving mucus membranes or skin necrosis: No Has patient had a PCN reaction that required hospitalization No Has patient had a PCN reaction occurring within the last 10 years: No If all of the above answers are "NO", then may proceed with Cephalosporin use.   [name] Phenazopyridine Hcl Hives         Antimicrobials this admission:  Vanc 8/5 >> 8/6 Levaquin 8/5 >>  Aztreonam 8/5 >>  Dose adjustments this admission:  n/a   Microbiology results:  8/5 BCx: pending 8/5 UCx:  pending 8/5 Sputum:  pending 8/5 MRSA PCR: (-)   Thank you for allowing pharmacy to be a part of this patient's care.  Pricilla Larsson 10/16/2016 11:13 AM

## 2016-10-16 NOTE — Progress Notes (Signed)
Lab called with critical lactic acid 2.5 up from 1.7 at 0540. MD R.Shanon Brow notified via text [place] at [phone].

## 2016-10-16 NOTE — Progress Notes (Addendum)
PROGRESS NOTE    [name]  NOI:[id number] DOB: Jun 16, 1950 DOA: 10/15/2016 PCP: [name], MD    Brief Narrative:  [name] is a 66 y.o. female with medical history significant of COPD, OSA, CVA with unclear hemiplegia per chart, IDDM, bipolar disorder dropped off in the ED waiting area by family  Members earlier today for altered mental status.  Pt was found to be sleepy hypotensive and was intubated in the ED for airway protection.  No history can be obtained from patient and no family present.  Pt is lightly sedated at this time, she arouses to voice and her ble seem to have muscle wasting , unknown if she is chronically bedbound or what her baseline functional status is.  Her bp has normalized with 30cc/kg ivf bolus in ED per sepsis protocol.  Pt referred for admission for acute respiratory failure of unclear etiology.   Assessment & Plan:   Principal Problem:   Acute respiratory failure with hypercapnia (HCC) Active Problems:   HCAP (healthcare-associated pneumonia)/Aspiration PNA   Hyperlipidemia   GERD   Obstructive sleep apnea   Constipation   Polypharmacy   Bipolar disorder (HCC)   Hemiplegia and hemiparesis following cerebral infarction affecting unspecified side (HCC)   Seizure disorder (HCC)   Gastroparesis   Hypothyroidism   COPD (chronic obstructive pulmonary disease) with chronic bronchitis (HCC)   Microcytic anemia   Pulmonary hypertension (HCC)   Poorly controlled type 2 diabetes mellitus with circulatory disorder (HCC)   Labile hypertension   Class 1 obesity with serious comorbidity and body mass index (BMI) of 33.0 to 33.9 in adult   Diabetic polyneuropathy associated with type 2 diabetes mellitus ([place])   Acute metabolic encephalopathy   AKI (acute kidney injury) ([place])  #1 acute respiratory failure with hypercapnia Likely secondary to healthcare associated pneumonia/aspiration pneumonia noted on repeat chest x-ray this morning and probable  acute COPD exacerbation. Patient with some clinical improvement and likely to be weaned of ventilator today. Patient has been pancultured cultures pending. Continue IV Solu-Medrol, IV Levaquin, IV aztreonam for now. Discontinue IV vancomycin as MRSA PCR is negative. Place on scheduled nebulizers as well as Pulmicort. Pulmonary following and appreciate input and recommendations.  #2 healthcare associated pneumonia/aspiration pneumonia Repeat chest x-ray consistent with pneumonia in the right base likely associated with aspiration pneumonia. Sputum Gram stain and cultures are pending. Blood cultures pending. Patient currently afebrile. Patient with no leukocytosis. MRSA by PCR is negative and as such will discontinue IV vancomycin. Continue IV Levaquin, IV aztreonam. Place on scheduled nebulizer treatments.  #3 probable acute COPD exacerbation Patient had presented with acute respiratory failure with hypercapnia. Patient intubated and on ventilatory. Blood gas this morning with improvement. Patient has been seen by pulmonary and patient to be weaned off ventilator today. Continue IV steroids, IV Levaquin, IV aztreonam. Place on Pulmicort and scheduled nebulizers. Pulmonary following and appreciate input and recommendations.  #4 hypothyroidism TSH is 0.827. Place on Synthroid 25 MCG's IV daily. Once patient is extubated and on a diet will transition back to oral Synthroid.  #5 constipation Patient states hasn't had a bowel movement in greater than a week. Patient with a history of constipation. Check abdominal films. Will give a soapsuds enema and a Dulcolax suppository. Once patient is extubated and on oral diet will need to place on a bowel regimen.  #6  acute encephalopathy Questionable etiology. Likely metabolic in nature secondary to pneumonia and probably acute COPD exacerbation. Lithium level is low.  CT head negative for any acute abnormalities. Check a EEG. Check MRI. Will check an ammonia level,  TSH, B-12, RPR, RBC folate. Continue empiric IV antibiotics.  #7 gastroesophageal reflux disease PPI.  #8 acute kidney injury Likely secondary to a prerenal azotemia in the setting of ARB. ARB on hold. Renal function improving with hydration. Follow.  #9 hypertension Antihypertensive medications will hold secondary to low blood pressure on admission. Place on metoprolol 5 mg IV every 8 hours.  #10 bipolar disorder Lithium level low.Once extubated and tolerating orals will resume home regimen.  #11 gastroparesis Will place on Reglan 5 mg IV every 6 hours.  #12 obstructive sleep apnea  #13  type 2 diabetes Hemoglobin A1c was 6.6 on 07/07/2016. Continue every 4h CBGs.  #14 microcytic anemia Follow H&H.  #15 seizure disorder Patient on any antiepileptic medications. EEG pending.     DVT prophylaxis: Lovenox Code Status: Full Family Communication: Updated patient. No family at bedside. Disposition Plan: Remain the step down unit. Disposition pending progress during hospitalization.   Consultants:   Pulmonary: Dr. Luan Pulling 10/16/2016  Procedures:   CT head 10/15/2016  Chest x-ray 10/15/2016, 10/16/2016    Antimicrobials:   IV aztreonam 10/15/2016  IV Levaquin 10/15/2016  IV vancomycin 10/15/2016>>>>> 10/16/2016   Subjective: Patient on the ventilator. Sedation has been decreased and as such patient opens eyes to verbal stimuli. Patient following commands appropriately. Patient denies any shortness of breath. Patient does endorse some chest discomfort. Patient with some complaints of constipation and not having a bowel movement in over a week. Patient with some abdominal discomfort.  Objective: Vitals:   10/16/16 0746 10/16/16 0800 10/16/16 0815 10/16/16 0830  BP:  (!) 155/66 (!) 157/66 (!) 148/66  Pulse:  73 67 60  Resp:   16 16  Temp:  98.2 F (36.8 C) 98.2 F (36.8 C) 98.2 F (36.8 C)  TempSrc:      SpO2: 100% 100% 100% 99%  Weight:      Height:         Intake/Output Summary (Last 24 hours) at 10/16/16 0859 Last data filed at 10/16/16 0800  Gross per 24 hour  Intake          1222.22 ml  Output              800 ml  Net           422.22 ml   Filed Weights   10/15/16 1528 10/15/16 2017 10/16/16 0500  Weight: 70.8 kg (156 lb) 70.8 kg (156 lb 1.4 oz) 70.9 kg (156 lb 4.9 oz)    Examination:  General exam: Intubated. On ventilator. Respiratory system: Clear to auscultation anterior lung fields.  Cardiovascular system: S1 & S2 heard, RRR. No JVD, murmurs, rubs, gallops or clicks. No pedal edema. Gastrointestinal system: Abdomen is nondistended, soft and some diffuse tenderness to palpation. No organomegaly or masses felt. Normal bowel sounds heard. Central nervous system: Patient opens eyes to verbal stimuli and following commands. Moving extremities spontaneously.  Extremities: Symmetric 5 x 5 power. Skin: No rashes, lesions or ulcers Psychiatry: Unable to assess as patient is on the ventilator.    Data Reviewed: I have personally reviewed following labs and imaging studies  CBC:  Recent Labs Lab 10/10/16 1812 10/15/16 1605 10/15/16 1652 10/16/16 0332  WBC 6.4  --  6.7 5.3  NEUTROABS 3.8  --  3.9  --   HGB 12.3 11.2* 10.2* 9.5*  HCT 40.7 33.0* 34.2* 31.7*  MCV 75.4*  --  76.2* 76.0*  PLT 246  --  244 546   Basic Metabolic Panel:  Recent Labs Lab 10/10/16 1812 10/15/16 1605 10/15/16 1652 10/15/16 2030 10/16/16 0332  NA 146* 143 144  --  140  K 3.5 3.6 3.5  --  4.1  CL 112* 105 107  --  112*  CO2 23  --  25  --  20*  GLUCOSE 212* 231* 233*  --  185*  BUN 14 32* 28*  --  19  CREATININE 0.85 1.90* 1.91*  --  0.93  CALCIUM 9.9  --  9.2  --  8.3*  MG  --   --   --  2.0  --    GFR: Estimated Creatinine Clearance: 52.3 mL/min (by C-G formula based on SCr of 0.93 mg/dL). Liver Function Tests:  Recent Labs Lab 10/10/16 1812 10/15/16 1652 10/16/16 0332  AST 36 29 27  ALT _0 ALKPHOS 151* 96 86    BILITOT 0.3 0.4 0.3  PROT 8.6* 6.9 6.2*  ALBUMIN 4.4 3.7 3.2*    Recent Labs Lab 10/10/16 1828  LIPASE 25    Recent Labs Lab 10/15/16 1933  AMMONIA 44*   Coagulation Profile:  Recent Labs Lab 10/15/16 2030  INR 1.07   Cardiac Enzymes:  Recent Labs Lab 10/15/16 1933 10/15/16 2030 10/16/16 0332  TROPONINI <0.03 <0.03 <0.03   BNP (last 3 results) No results for input(s): PROBNP in the last 8760 hours. HbA1C: No results for input(s): HGBA1C in the last 72 hours. CBG:  Recent Labs Lab 10/15/16 1545 10/15/16 2135 10/16/16 0012 10/16/16 0347 10/16/16 0749  GLUCAP 225* 208* 144* 182* 122*   Lipid Profile: No results for input(s): CHOL, HDL, LDLCALC, TRIG, CHOLHDL, LDLDIRECT in the last 72 hours. Thyroid Function Tests:  Recent Labs  10/15/16 2031  TSH 0.827   Anemia Panel:  Recent Labs  10/15/16 1933  RETICCTPCT 1.9   Sepsis Labs:  Recent Labs Lab 10/15/16 1604 10/15/16 1917 10/15/16 1933 10/15/16 2030 10/16/16 0332  PROCALCITON  --   --  <0.10  --   --   LATICACIDVEN 3.14* 1.38  --  1.7 2.5*    Recent Results (from the past 240 hour(s))  Blood Culture (routine x 2)     Status: None (Preliminary result)   Collection Time: 10/15/16  4:35 PM  Result Value Ref Range Status   Specimen Description BLOOD LEFT HAND  Final   Special Requests   Final    Blood Culture results may not be optimal due to an inadequate volume of blood received in culture bottles   Culture NO GROWTH < 24 HOURS  Final   Report Status PENDING  Incomplete  Blood Culture (routine x 2)     Status: None (Preliminary result)   Collection Time: 10/15/16  4:45 PM  Result Value Ref Range Status   Specimen Description BLOOD LEFT WRIST  Final   Special Requests   Final    Blood Culture results may not be optimal due to an inadequate volume of blood received in culture bottles   Culture NO GROWTH < 24 HOURS  Final   Report Status PENDING  Incomplete  MRSA PCR Screening      Status: None   Collection Time: 10/15/16  8:08 PM  Result Value Ref Range Status   MRSA by PCR NEGATIVE NEGATIVE Final    Comment:        The GeneXpert MRSA Assay (FDA approved for NASAL specimens only), is  one component of a comprehensive MRSA colonization surveillance program. It is not intended to diagnose MRSA infection nor to guide or monitor treatment for MRSA infections.          Radiology Studies: Ct Head Wo Contrast  Result Date: 10/15/2016 CLINICAL DATA:  Pt c/o upper back pain after fall 3 days ago. Pt denies LOC upon fall. Pt's BP in triage 87/52. Pt falling asleep in triage, but easily arouses. Pt reports she doesn't know why she's so sleepy. Denies dizziness, weakness. EXAM: CT HEAD WITHOUT CONTRAST TECHNIQUE: Contiguous axial images were obtained from the base of the skull through the vertex without intravenous contrast. COMPARISON:  02/17/2016, 05/01/2015 FINDINGS: Brain: There is encephalomalacia and postsurgical change involving the frontal lobes bilaterally. 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 Minimal soft tissue is identified within this same location, less apparent without intravenous contrast. There is no significant mass effect or edema in this region. No new mass or evidence for acute infarction. There is mild central and cortical atrophy. Vascular: There is dense atherosclerotic calcification of the internal carotid arteries. Skull: Status post bifrontal craniotomy. No acute calvarial abnormality. Sinuses/Orbits: No acute finding. Other: None IMPRESSION: 1. Status post bifrontal craniotomy. 2. Soft tissue density in the region of the known planum sphenoidale meningioma shows no evidence for edema or mass effect by CT. Consider MRI for more accurate comparison. 3. No evidence for acute injury. Electronically Signed   By: Nolon Nations M.D.   On: 10/15/2016 17:56   Portable  Chest 1 View  Result Date: 10/16/2016 CLINICAL DATA:  Hypoxia EXAM: PORTABLE CHEST 1 VIEW COMPARISON:  October 15, 2016 FINDINGS: Endotracheal tube tip is 5.1 cm above the carina. Nasogastric tube tip and side port are below the diaphragm. No pneumothorax. There is consolidation in the right lung base. Lungs elsewhere clear. Heart is borderline enlarged with pulmonary vascularity within normal limits. No adenopathy. No bone lesions. IMPRESSION: Airspace consolidation right base. Lungs elsewhere clear. Stable cardiac prominence. Tube positions as described without pneumothorax. Electronically Signed   By: Lowella Grip III M.D.   On: 10/16/2016 07:20   Dg Chest Portable 1 View  Result Date: 10/15/2016 CLINICAL DATA:  Code sepsis.  Life support line placement. EXAM: PORTABLE CHEST 1 VIEW COMPARISON:  Chest radiograph September 10, 2016 FINDINGS: Endotracheal tube tip projects 2.2 cm above the carina. Nasogastric tube past GE junction, distal tip not included. Patient rotated to the RIGHT accentuating vascular markings. RIGHT lung base strandy densities. Low inspiratory examination without pleural effusion or focal consolidation. Stable RIGHT midlung zone scarring. No pneumothorax. Soft tissue planes and included osseous structures are unchanged. IMPRESSION: Endotracheal tube tip projects 2.2 cm above the carina. Nasogastric tube past GE junction, distal tip not included. Low inspiratory examination of the RIGHT mid and lower lung zone atelectasis/ scarring. Electronically Signed   By: Elon Alas M.D.   On: 10/15/2016 16:53        Scheduled Meds: . bisacodyl  10 mg Rectal Once  . chlorhexidine gluconate (MEDLINE KIT)  15 mL Mouth Rinse BID  . enoxaparin (LOVENOX) injection  30 mg Subcutaneous Q24H  . insulin aspart  0-9 Units Subcutaneous Q4H  . levothyroxine  25 mcg Intravenous QAC breakfast  . mouth rinse  15 mL Mouth Rinse QID  . methylPREDNISolone (SOLU-MEDROL) injection  80 mg Intravenous Q12H   . pantoprazole (PROTONIX) IV  40 mg Intravenous Q24H   Continuous Infusions: .  sodium chloride 1,000 mL (10/16/16 4619)  . aztreonam Stopped (10/16/16 0839)  . fentaNYL infusion INTRAVENOUS 50 mcg/hr (10/16/16 0800)  . [START ON 10/17/2016] levofloxacin (LEVAQUIN) IV    . vancomycin       LOS: 1 day    Time spent: 19 mins    THOMPSON,DANIEL, MD [facility] Pager [phone]  If 7PM-7AM, please contact night-coverage [email or web address] Password [email or web address] 10/16/2016, 8:59 AM

## 2016-10-16 NOTE — Progress Notes (Signed)
Second ventilator wean attempt made at this time. On CP/PSV of 5/5 the patient was only able to get in tidal volumes of less than 200. She still seems very sleepy even after sedation weaned drastically. I placed patient back in full support mode once again on previous settings.  I am going to go ahead and obtain ABG on current settings and order another if needed when weaning trials have begun.

## 2016-10-16 NOTE — Consult Note (Addendum)
Consult requested by: [facility] Consult requested for: Acute on chronic respiratory failure  HPI: This is a 66 year old who came to the emergency department with altered mental status and was intubated in the emergency department for airway protection. No history is available except from previous medical records and her history and physical. She is able to open her eyes but is still very sleepy. Per the chart she has COPD obstructive sleep apnea previous stroke insulin-dependent diabetes bipolar disease. Chest x-ray from yesterday which I personally reviewed shows some right base atelectasis/infiltrate and chest x-ray this morning which I personally reviewed shows more clearly right lower lobe pneumonia. Considering the altered mental status this is likely from aspiration.  Past Medical History:  Diagnosis Date  . Anemia   . Anxiety    takes Ativan daily  . Arthritis   . Bipolar disorder ([place])    takes Risperdal nightly  . Blood transfusion   . Cancer ([place])    In her gum  . Carpal tunnel syndrome of right wrist 05/23/2011  . Cervical disc disorder with radiculopathy of cervical region 10/31/2012  . Chronic back pain   . Chronic idiopathic constipation   . Colon polyps   . COPD (chronic obstructive pulmonary disease) with chronic bronchitis ([place]) 09/16/2013   Office Spirometry 10/30/2013-submaximal effort based on appearance of loop and curve. Numbers would fit with severe restriction but her physiologic capability may be better than this. FVC 0.91/44%, and 10.74/45%, FEV1/FVC 0.81, FEF 25-75% 1.43/69%    . Depression    takes Zoloft daily  . Diabetes mellitus    Type II  . Diverticulosis    TCS 9/08 by Dr. Delfin Edis for diarrhea . Bx for micro scopic colitis negative.   . Fibromyalgia   . Frequent falls   . GERD (gastroesophageal reflux disease)    takes Aciphex daily  . Glaucoma    eye drops daily  . Gum symptoms    infection on antibiotic  . Hemiplegia affecting  non-dominant side, post-stroke 08/02/2011  . Hyperlipidemia    takes Crestor daily  . Hypertension    takes Amlodipine,Metoprolol,and Clonidine daily  . Hypothyroidism    takes Synthroid daily  . IBS (irritable bowel syndrome)   . Insomnia    takes Trazodone nightly  . Metabolic encephalopathy 1/61/0960  . Migraines    chronic headaches  . Mononeuritis lower limb   . Osteoporosis   . Pancreatitis 2006   due to Depakote with normal EUS   . Schatzki's ring    non critical / EGD with ED 8/2011with RMR  . Seizures ([place])    takes Lamictal daily.Last seizure 3 yrs ago  . Sleep apnea    on CPAP  . Stroke [facility])    left sided weakness  . Tubular adenoma of colon      Family History  Problem Relation Age of Onset  . Heart attack Mother        HTN  . Pneumonia Father   . Kidney failure Father   . Diabetes Father   . Pancreatic cancer Sister   . Diabetes Brother   . Hypertension Brother   . Diabetes Brother   . Cancer Sister        breast   . Hypertension Son   . Sleep apnea Son   . Cancer Sister        pancreatic  . Stroke Maternal Grandmother   . Heart attack Maternal Grandfather   . Alcohol abuse Maternal Uncle   .  Colon cancer Neg Hx   . Anesthesia problems Neg Hx   . Hypotension Neg Hx   . Malignant hyperthermia Neg Hx   . Pseudochol deficiency Neg Hx      Social History   Social History  . Marital status: Divorced    Spouse name: N/A  . Number of children: 1  . Years of education: 29   Occupational History  . disabled     Social History Main Topics  . Smoking status: Light Tobacco Smoker    Packs/day: 0.25    Years: 7.00    Types: Cigarettes  . Smokeless tobacco: Never Used     Comment: continues to smoke 1/4 pack a day   . Alcohol use No     Comment:    . Drug use: No  . Sexual activity: Not Currently   Other Topics Concern  . None   Social History Narrative  . None     ROS: Unobtainable    Objective: Vital signs in last 24  hours: Temp:  [96.3 F (35.7 C)-98.4 F (36.9 C)] 98.4 F (36.9 C) (08/06 0630) Pulse Rate:  [56-87] 57 (08/06 0630) Resp:  [13-20] 16 (08/06 0630) BP: (87-176)/(52-76) 141/67 (08/06 0630) SpO2:  [99 %-100 %] 100 % (08/06 0630) FiO2 (%):  [40 %-100 %] 40 % (08/06 0406) Weight:  [70.8 kg (156 lb)-70.9 kg (156 lb 4.9 oz)] 70.9 kg (156 lb 4.9 oz) (08/06 0500) Weight change:  Last BM Date:  (unknown)  Intake/Output from previous day: 08/05 0701 - 08/06 0700 In: 1192.2 [I.V.:1142.2; IV Piggyback:50] Out: 800 [Urine:800]  PHYSICAL EXAM Constitutional: She is able to open her eyes but is sedated and on mechanical ventilation. Eyes: Pupils react ears nose mouth and throat: She has an endotracheal and orogastric tubes in place. Her mucous membranes are somewhat dry. Cardiovascular: Her heart is regular with normal heart sounds no edema. Respiratory: She is intubated and on the ventilator. She has some rhonchi bilaterally right more than left. Gastrointestinal: Her abdomen is soft with no masses. Skin: Poor skin turgor musculoskeletal: Unable to assess. Neurological: She is sedated psychiatric: She is sedated  Lab Results: Basic Metabolic Panel:  Recent Labs  10/15/16 1652 10/15/16 2030 10/16/16 0332  NA 144  --  140  K 3.5  --  4.1  CL 107  --  112*  CO2 25  --  20*  GLUCOSE 233*  --  185*  BUN 28*  --  19  CREATININE 1.91*  --  0.93  CALCIUM 9.2  --  8.3*  MG  --  2.0  --    Liver Function Tests:  Recent Labs  10/15/16 1652 10/16/16 0332  AST 29 27  ALT 22 19  ALKPHOS 96 86  BILITOT 0.4 0.3  PROT 6.9 6.2*  ALBUMIN 3.7 3.2*   No results for input(s): LIPASE, AMYLASE in the last 72 hours.  Recent Labs  10/15/16 1933  AMMONIA 44*   CBC:  Recent Labs  10/15/16 1652 10/16/16 0332  WBC 6.7 5.3  NEUTROABS 3.9  --   HGB 10.2* 9.5*  HCT 34.2* 31.7*  MCV 76.2* 76.0*  PLT 244 217   Cardiac Enzymes:  Recent Labs  10/15/16 1933 10/15/16 2030 10/16/16 0332   TROPONINI <0.03 <0.03 <0.03   BNP: No results for input(s): PROBNP in the last 72 hours. D-Dimer:  Recent Labs  10/15/16 2031  DDIMER 0.28   CBG:  Recent Labs  10/15/16 1545 10/15/16 2135 10/16/16 0012  10/16/16 0347 10/16/16 0749  GLUCAP 225* 208* 144* 182* 122*   Hemoglobin A1C: No results for input(s): HGBA1C in the last 72 hours. Fasting Lipid Panel: No results for input(s): CHOL, HDL, LDLCALC, TRIG, CHOLHDL, LDLDIRECT in the last 72 hours. Thyroid Function Tests:  Recent Labs  10/15/16 2031  TSH 0.827   Anemia Panel:  Recent Labs  10/15/16 1933  RETICCTPCT 1.9   Coagulation:  Recent Labs  10/15/16 2030  LABPROT 13.9  INR 1.07   Urine Drug Screen: Drugs of Abuse     Component Value Date/Time   LABOPIA NONE DETECTED 10/15/2016 1700   COCAINSCRNUR NONE DETECTED 10/15/2016 1700   LABBENZ POSITIVE (A) 10/15/2016 1700   AMPHETMU NONE DETECTED 10/15/2016 1700   THCU NONE DETECTED 10/15/2016 1700   LABBARB NONE DETECTED 10/15/2016 1700    Alcohol Level:  Recent Labs  10/15/16 1933  ETH <5   Urinalysis:  Recent Labs  10/15/16 1622  COLORURINE AMBER*  LABSPEC >1.030*  PHURINE 6.0  GLUCOSEU NEGATIVE  HGBUR NEGATIVE  BILIRUBINUR SMALL*  KETONESUR TRACE*  PROTEINUR 100*  NITRITE NEGATIVE  LEUKOCYTESUR NEGATIVE   Misc. Labs:   ABGS:  Recent Labs  10/15/16 1605  10/15/16 2122  PHART  --   < > 7.300*  PO2ART  --   < > 109.0*  TCO2 25  --   --   HCO3  --   < > 21.2  < > = values in this interval not displayed.   MICROBIOLOGY: Recent Results (from the past 240 hour(s))  Blood Culture (routine x 2)     Status: None (Preliminary result)   Collection Time: 10/15/16  4:35 PM  Result Value Ref Range Status   Specimen Description BLOOD LEFT HAND  Final   Special Requests   Final    Blood Culture results may not be optimal due to an inadequate volume of blood received in culture bottles   Culture NO GROWTH < 24 HOURS  Final    Report Status PENDING  Incomplete  Blood Culture (routine x 2)     Status: None (Preliminary result)   Collection Time: 10/15/16  4:45 PM  Result Value Ref Range Status   Specimen Description BLOOD LEFT WRIST  Final   Special Requests   Final    Blood Culture results may not be optimal due to an inadequate volume of blood received in culture bottles   Culture NO GROWTH < 24 HOURS  Final   Report Status PENDING  Incomplete  MRSA PCR Screening     Status: None   Collection Time: 10/15/16  8:08 PM  Result Value Ref Range Status   MRSA by PCR NEGATIVE NEGATIVE Final    Comment:        The GeneXpert MRSA Assay (FDA approved for NASAL specimens only), is one component of a comprehensive MRSA colonization surveillance program. It is not intended to diagnose MRSA infection nor to guide or monitor treatment for MRSA infections.     Studies/Results: Ct Head Wo Contrast  Result Date: 10/15/2016 CLINICAL DATA:  Pt c/o upper back pain after fall 3 days ago. Pt denies LOC upon fall. Pt's BP in triage 87/52. Pt falling asleep in triage, but easily arouses. Pt reports she doesn't know why she's so sleepy. Denies dizziness, weakness. EXAM: CT HEAD WITHOUT CONTRAST TECHNIQUE: Contiguous axial images were obtained from the base of the skull through the vertex without intravenous contrast. COMPARISON:  02/17/2016, 05/01/2015 FINDINGS: Brain: There is encephalomalacia and postsurgical  change involving the frontal lobes bilaterally. 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 Minimal soft tissue is identified within this same location, less apparent without intravenous contrast. There is no significant mass effect or edema in this region. No new mass or evidence for acute infarction. There is mild central and cortical atrophy. Vascular: There is dense atherosclerotic calcification of the internal carotid arteries. Skull: Status  post bifrontal craniotomy. No acute calvarial abnormality. Sinuses/Orbits: No acute finding. Other: None IMPRESSION: 1. Status post bifrontal craniotomy. 2. Soft tissue density in the region of the known planum sphenoidale meningioma shows no evidence for edema or mass effect by CT. Consider MRI for more accurate comparison. 3. No evidence for acute injury. Electronically Signed   By: Nolon Nations M.D.   On: 10/15/2016 17:56   Portable Chest 1 View  Result Date: 10/16/2016 CLINICAL DATA:  Hypoxia EXAM: PORTABLE CHEST 1 VIEW COMPARISON:  October 15, 2016 FINDINGS: Endotracheal tube tip is 5.1 cm above the carina. Nasogastric tube tip and side port are below the diaphragm. No pneumothorax. There is consolidation in the right lung base. Lungs elsewhere clear. Heart is borderline enlarged with pulmonary vascularity within normal limits. No adenopathy. No bone lesions. IMPRESSION: Airspace consolidation right base. Lungs elsewhere clear. Stable cardiac prominence. Tube positions as described without pneumothorax. Electronically Signed   By: Lowella Grip III M.D.   On: 10/16/2016 07:20   Dg Chest Portable 1 View  Result Date: 10/15/2016 CLINICAL DATA:  Code sepsis.  Life support line placement. EXAM: PORTABLE CHEST 1 VIEW COMPARISON:  Chest radiograph September 10, 2016 FINDINGS: Endotracheal tube tip projects 2.2 cm above the carina. Nasogastric tube past GE junction, distal tip not included. Patient rotated to the RIGHT accentuating vascular markings. RIGHT lung base strandy densities. Low inspiratory examination without pleural effusion or focal consolidation. Stable RIGHT midlung zone scarring. No pneumothorax. Soft tissue planes and included osseous structures are unchanged. IMPRESSION: Endotracheal tube tip projects 2.2 cm above the carina. Nasogastric tube past GE junction, distal tip not included. Low inspiratory examination of the RIGHT mid and lower lung zone atelectasis/ scarring. Electronically Signed    By: Elon Alas M.D.   On: 10/15/2016 16:53    Medications:  Prior to Admission:  Prescriptions Prior to Admission  Medication Sig Dispense Refill Last Dose  . ACCU-CHEK FASTCLIX LANCETS MISC Use to test blood sugar 4 times daily. Dx: E10.65 408 each 1 Taking  . ACCU-CHEK SMARTVIEW test strip TEST BLOOD SUGAR FOUR TIMES DAILY AS DIRECTED 350 each 2 Taking  . albuterol (PROVENTIL HFA;VENTOLIN HFA) 108 (90 Base) MCG/ACT inhaler Inhale 1-2 puffs every 6 hours as needed for wheezing, shortness of breath 3 Inhaler 3 Taking  . Alcohol Swabs (B-D SINGLE USE SWABS REGULAR) PADS Use for injections and glucose testing 8 times daily. Dx: E10.65 900 each 1 Taking  . amLODipine (NORVASC) 5 MG tablet TAKE 2 TABLETS BY MOUTH ONCE DAILY. 60 tablet 0 Taking  . aspirin EC 81 MG tablet Take 81 mg by mouth daily.   Taking  . Azelastine-Fluticasone (DYMISTA) 137-50 MCG/ACT SUSP Place 1 puff into the nose at bedtime. 1 Bottle 0 Taking  . BD PEN NEEDLE NANO U/F 32G X 4 MM MISC USE FOUR TIMES DAILY 360 each 3 Taking  . Blood Glucose Calibration (ACCU-CHEK SMARTVIEW CONTROL) LIQD 1 each by Other route as needed. 1 each 2 Taking  . Blood Glucose Monitoring Suppl (ACCU-CHEK NANO  SMARTVIEW) W/DEVICE KIT 1 each by Does not apply route daily. Dx: E10.65 (Patient not taking: Reported on 09/27/2016) 1 kit 0 Not Taking  . Cholecalciferol (VITAMIN D) 2000 units CAPS Take 1 capsule by mouth daily.   Taking  . clobetasol cream (TEMOVATE) 5.42 % Apply 1 application topically daily.   Taking  . cloNIDine (CATAPRES) 0.3 MG tablet TAKE 1 TABLET BY MOUTH EVERY EIGHT HOURS. ONCE AT 8AM, 4PM, AND 12 MIDNIGHT. 270 tablet 0 Taking  . Coenzyme Q-10 100 MG capsule Take 1 capsule (100 mg total) by mouth 3 (three) times daily. 90 capsule 5 Taking  . diclofenac sodium (VOLTAREN) 1 % GEL Apply 2 g topically daily as needed (Pain). 100 g 2 Taking  . hydroxychloroquine (PLAQUENIL) 200 MG tablet Take 200 mg by mouth daily.   Taking  .  Insulin Glargine (TOUJEO SOLOSTAR) 300 UNIT/ML SOPN Inject 25 Units into the skin at bedtime. 6 pen 3 Taking  . lamoTRIgine (LAMICTAL) 100 MG tablet Take 1 tablet (100 mg total) by mouth 2 (two) times daily. 60 tablet 5 Taking  . levothyroxine (SYNTHROID, LEVOTHROID) 50 MCG tablet TAKE 1 TABLET BY MOUTH ONCE DAILY AND 1/2 TABLET ON SUNDAYS. 30 tablet 0 Taking  . LORazepam (ATIVAN) 0.5 MG tablet Take 1 tablet (0.5 mg total) by mouth 3 (three) times daily. 90 tablet 2   . losartan (COZAAR) 50 MG tablet TAKE ONE TABLET BY MOUTH DAILY. 90 tablet 1 Taking  . metoprolol tartrate (LOPRESSOR) 50 MG tablet Take 1 tablet (50 mg total) by mouth 2 (two) times daily. Please call and schedule appointment 60 tablet 0 Taking  . montelukast (SINGULAIR) 10 MG tablet TAKE ONE TABLET BY MOUTH ONCE DAILY. 90 tablet 0 Taking  . Multiple Vitamins-Minerals (ONE-A-DAY 50 PLUS PO) Take 1 tablet by mouth daily.   Taking  . NOVOLOG FLEXPEN 100 UNIT/ML FlexPen INJECT 16 TO 30 UNITS INTO THE SKIN 3 (THREE) TIMES DAILY WITH MEALS. 90 mL 2 Taking  . nystatin (MYCOSTATIN/NYSTOP) powder APPLY TO AFFECTED AREA FOUR TIMES DAILY. 30 g 2 Taking  . ondansetron (ZOFRAN ODT) 8 MG disintegrating tablet Take 1 tablet (8 mg total) by mouth every 8 (eight) hours as needed for nausea or vomiting. 10 tablet 0   . pregabalin (LYRICA) 75 MG capsule Take 75 mg by mouth 2 (two) times daily.    Taking  . RABEprazole (ACIPHEX) 20 MG tablet Take 1 tablet (20 mg total) by mouth 2 (two) times daily. 180 tablet 1 Taking  . RESTASIS 0.05 % ophthalmic emulsion Place 1 drop into both eyes 2 (two) times daily.    Taking  . Riboflavin 400 MG TABS Take 400 mg by mouth daily. (Patient not taking: Reported on 09/27/2016) 30 tablet 5 Not Taking  . risperiDONE (RISPERDAL) 0.5 MG tablet Take 1 tablet (0.5 mg total) by mouth at bedtime. 90 tablet 2   . rosuvastatin (CRESTOR) 5 MG tablet TAKE 1 TABLET BY MOUTH AT BEDTIME. 90 tablet 0 Taking  . sertraline (ZOLOFT) 100  MG tablet Take 2 tablets (200 mg total) by mouth daily. 180 tablet 2   . traZODone (DESYREL) 100 MG tablet Take 2 tablets (200 mg total) by mouth at bedtime. 180 tablet 2   . vitamin B-12 (CYANOCOBALAMIN) 1000 MCG tablet Take 1,000 mcg by mouth daily.   Taking   Scheduled: . chlorhexidine gluconate (MEDLINE KIT)  15 mL Mouth Rinse BID  . enoxaparin (LOVENOX) injection  30 mg Subcutaneous Q24H  . insulin aspart  0-9 Units Subcutaneous Q4H  . mouth rinse  15 mL Mouth Rinse QID  . methylPREDNISolone (SOLU-MEDROL) injection  80 mg Intravenous Q12H  . pantoprazole (PROTONIX) IV  40 mg Intravenous Q24H   Continuous: . sodium chloride 1,000 mL (10/16/16 0141)  . aztreonam Stopped (10/15/16 2350)  . fentaNYL infusion INTRAVENOUS 100 mcg/hr (10/15/16 2300)  . [START ON 10/17/2016] levofloxacin (LEVAQUIN) IV    . vancomycin     CVU:DTHYHOOIL, ondansetron **OR** ondansetron (ZOFRAN) IV  Assesment: She has acute hypercapnic respiratory failure on ventilator support. She has pneumonia likely from aspiration which is being treated. She had acute kidney injury being treated basically with fluid resuscitation. She has COPD at baseline. She has sleep apnea at baseline. She has seizure disorder at baseline and it is not clear if she had a seizure the minor precipitated this episode or exactly what happened since we did not have any family to give [name] history. She had acute metabolic encephalopathy which seems to be getting better. She has a listed diagnosis of bipolar disease and she is on medications for that by history it is not clear exactly how much trouble she has with this. There is a listed diagnosis of pulmonary hypertension. Principal Problem:   Acute respiratory failure with hypercapnia (HCC) Active Problems:   Obstructive sleep apnea   Polypharmacy   Bipolar disorder (HCC)   Hemiplegia and hemiparesis following cerebral infarction affecting unspecified side (HCC)   Seizure disorder (HCC)    Gastroparesis   COPD (chronic obstructive pulmonary disease) with chronic bronchitis (HCC)   Microcytic anemia   Pulmonary hypertension (HCC)   Poorly controlled type 2 diabetes mellitus with circulatory disorder (HCC)   Labile hypertension   Class 1 obesity with serious comorbidity and body mass index (BMI) of 33.0 to 33.9 in adult   Acute metabolic encephalopathy   AKI (acute kidney injury) ([place])    Plan: She is on antibiotic coverage. She is on steroids which is appropriate. She may be able to wean today. She has blood gas ordered but I'm going to hold off on that until we see if she's able to wean or not.  Thanks for allowing me to see her with you   LOS: 1 day   Leita Lindbloom L 10/16/2016, 7:55 AM

## 2016-10-16 NOTE — Progress Notes (Signed)
Soap suds enema given to pt with no results. Pt was unable to hold. Will continue to monitor.

## 2016-10-17 ENCOUNTER — Telehealth: Payer: Self-pay

## 2016-10-17 ENCOUNTER — Inpatient Hospital Stay (HOSPITAL_COMMUNITY): Payer: Medicare HMO

## 2016-10-17 ENCOUNTER — Inpatient Hospital Stay (HOSPITAL_COMMUNITY)
Admit: 2016-10-17 | Discharge: 2016-10-17 | Disposition: A | Discharge status: Home / Self Care | Payer: Medicare HMO | Attending: Family Medicine | Admitting: Family Medicine

## 2016-10-17 DIAGNOSIS — E1159 Type 2 diabetes mellitus with other circulatory complications: Secondary | ICD-10-CM

## 2016-10-17 DIAGNOSIS — R569 Unspecified convulsions: Secondary | ICD-10-CM | POA: Diagnosis not present

## 2016-10-17 DIAGNOSIS — E1165 Type 2 diabetes mellitus with hyperglycemia: Secondary | ICD-10-CM

## 2016-10-17 LAB — BLOOD GAS, ARTERIAL
ACID-BASE DEFICIT: 3.5 mmol/L — AB (ref 0.0–2.0)
ACID-BASE DEFICIT: 3.6 mmol/L — AB (ref 0.0–2.0)
BICARBONATE: 20.9 mmol/L (ref 20.0–28.0)
Bicarbonate: 20.6 mmol/L (ref 20.0–28.0)
DRAWN BY: 234301
Drawn by: 234301
FIO2: 35
FIO2: 40
LHR: 16 {breaths}/min
MECHVT: 400 mL
MODE: POSITIVE
O2 Saturation: 92.1 %
O2 Saturation: 94.5 %
PEEP: 5 cmH2O
PEEP: 5 cmH2O
PH ART: 7.211 — AB (ref 7.350–7.450)
PO2 ART: 71.5 mmHg — AB (ref 83.0–108.0)
Patient temperature: 37
Patient temperature: 37
Pressure support: 5 cmH2O
pCO2 arterial: 51 mmHg — ABNORMAL HIGH (ref 32.0–48.0)
pCO2 arterial: 60.1 mmHg — ABNORMAL HIGH (ref 32.0–48.0)
pH, Arterial: 7.267 — ABNORMAL LOW (ref 7.350–7.450)
pO2, Arterial: 85 mmHg (ref 83.0–108.0)

## 2016-10-17 LAB — COMPREHENSIVE METABOLIC PANEL
ALT: 18 U/L (ref 14–54)
ANION GAP: 7 (ref 5–15)
AST: 24 U/L (ref 15–41)
Albumin: 3.2 g/dL — ABNORMAL LOW (ref 3.5–5.0)
Alkaline Phosphatase: 77 U/L (ref 38–126)
BUN: 10 mg/dL (ref 6–20)
CHLORIDE: 109 mmol/L (ref 101–111)
CO2: 22 mmol/L (ref 22–32)
CREATININE: 0.65 mg/dL (ref 0.44–1.00)
Calcium: 8 mg/dL — ABNORMAL LOW (ref 8.9–10.3)
Glucose, Bld: 198 mg/dL — ABNORMAL HIGH (ref 65–99)
POTASSIUM: 3.8 mmol/L (ref 3.5–5.1)
SODIUM: 138 mmol/L (ref 135–145)
Total Bilirubin: 0.6 mg/dL (ref 0.3–1.2)
Total Protein: 6.5 g/dL (ref 6.5–8.1)

## 2016-10-17 LAB — CBC WITH DIFFERENTIAL/PLATELET
Basophils Absolute: 0 10*3/uL (ref 0.0–0.1)
Basophils Relative: 0 %
EOS ABS: 0 10*3/uL (ref 0.0–0.7)
Eosinophils Relative: 0 %
HCT: 30.3 % — ABNORMAL LOW (ref 36.0–46.0)
Hemoglobin: 9.2 g/dL — ABNORMAL LOW (ref 12.0–15.0)
LYMPHS ABS: 0.5 10*3/uL — AB (ref 0.7–4.0)
LYMPHS PCT: 6 %
MCH: 22.8 pg — AB (ref 26.0–34.0)
MCHC: 30.4 g/dL (ref 30.0–36.0)
MCV: 75 fL — AB (ref 78.0–100.0)
MONO ABS: 0.4 10*3/uL (ref 0.1–1.0)
Monocytes Relative: 4 %
Neutro Abs: 8.7 10*3/uL — ABNORMAL HIGH (ref 1.7–7.7)
Neutrophils Relative %: 90 %
PLATELETS: 219 10*3/uL (ref 150–400)
RBC: 4.04 MIL/uL (ref 3.87–5.11)
RDW: 14.3 % (ref 11.5–15.5)
WBC: 9.6 10*3/uL (ref 4.0–10.5)

## 2016-10-17 LAB — HEMOGLOBIN A1C
HEMOGLOBIN A1C: 6.3 % — AB (ref 4.8–5.6)
MEAN PLASMA GLUCOSE: 134 mg/dL

## 2016-10-17 LAB — GLUCOSE, CAPILLARY
GLUCOSE-CAPILLARY: 117 mg/dL — AB (ref 65–99)
GLUCOSE-CAPILLARY: 130 mg/dL — AB (ref 65–99)
GLUCOSE-CAPILLARY: 169 mg/dL — AB (ref 65–99)
GLUCOSE-CAPILLARY: 181 mg/dL — AB (ref 65–99)
GLUCOSE-CAPILLARY: 181 mg/dL — AB (ref 65–99)
GLUCOSE-CAPILLARY: 191 mg/dL — AB (ref 65–99)
GLUCOSE-CAPILLARY: 203 mg/dL — AB (ref 65–99)

## 2016-10-17 LAB — URINE CULTURE: CULTURE: NO GROWTH

## 2016-10-17 LAB — PROCALCITONIN: Procalcitonin: <0.1 ng/mL

## 2016-10-17 MED ORDER — PANTOPRAZOLE SODIUM 40 MG IV SOLR
40.0000 mg | Freq: Two times a day (BID) | INTRAVENOUS | Status: DC
  Administered 2016-10-17 – 2016-10-18 (×4): 40 mg via INTRAVENOUS
  Filled 2016-10-17 (×5): qty 40

## 2016-10-17 MED ORDER — FENTANYL 2500MCG IN NS 250ML (10MCG/ML) PREMIX INFUSION
INTRAVENOUS | Status: AC
  Filled 2016-10-17: qty 250

## 2016-10-17 MED ORDER — DIPHENHYDRAMINE HCL 50 MG/ML IJ SOLN
25.0000 mg | Freq: Once | INTRAMUSCULAR | Status: AC
  Administered 2016-10-17: 25 mg via INTRAVENOUS
  Filled 2016-10-17: qty 1

## 2016-10-17 MED ORDER — SODIUM CHLORIDE 0.9 % IV SOLN
1000.0000 mL | INTRAVENOUS | Status: DC
  Administered 2016-10-17 – 2016-10-18 (×2): 1000 mL via INTRAVENOUS

## 2016-10-17 MED ORDER — LAMOTRIGINE 100 MG PO TABS
100.0000 mg | ORAL_TABLET | Freq: Two times a day (BID) | ORAL | Status: DC
  Administered 2016-10-17 (×2): 100 mg
  Filled 2016-10-17 (×3): qty 1

## 2016-10-17 MED ORDER — ARFORMOTEROL TARTRATE 15 MCG/2ML IN NEBU
15.0000 ug | INHALATION_SOLUTION | Freq: Two times a day (BID) | RESPIRATORY_TRACT | Status: DC
  Administered 2016-10-17 – 2016-10-19 (×4): 15 ug via RESPIRATORY_TRACT
  Filled 2016-10-17 (×4): qty 2

## 2016-10-17 MED ORDER — SERTRALINE HCL 50 MG PO TABS
200.0000 mg | ORAL_TABLET | Freq: Every day | ORAL | Status: DC
  Administered 2016-10-17: 200 mg
  Filled 2016-10-17: qty 4

## 2016-10-17 MED ORDER — IPRATROPIUM-ALBUTEROL 0.5-2.5 (3) MG/3ML IN SOLN
3.0000 mL | Freq: Four times a day (QID) | RESPIRATORY_TRACT | Status: DC
  Administered 2016-10-18 – 2016-10-19 (×8): 3 mL via RESPIRATORY_TRACT
  Filled 2016-10-17 (×8): qty 3

## 2016-10-17 MED ORDER — AMLODIPINE BESYLATE 5 MG PO TABS
10.0000 mg | ORAL_TABLET | Freq: Every day | ORAL | Status: DC
  Administered 2016-10-17: 10 mg
  Filled 2016-10-17: qty 2

## 2016-10-17 MED ORDER — CLONIDINE HCL 0.2 MG PO TABS
0.3000 mg | ORAL_TABLET | Freq: Three times a day (TID) | ORAL | Status: DC
  Administered 2016-10-17 – 2016-10-18 (×6): 0.3 mg
  Filled 2016-10-17 (×6): qty 1

## 2016-10-17 NOTE — Telephone Encounter (Signed)
Called patient and gave lab results. Patient had no questions or concerns.  

## 2016-10-17 NOTE — Telephone Encounter (Signed)
-----   Message from Philemon Kingdom, MD sent at 10/16/2016  5:54 PM EDT ----- Almyra Free, can you please call pt: Her thyroid nodules are either stable or smaller compared to before - no intervention is needed.

## 2016-10-17 NOTE — Progress Notes (Signed)
Subjective: She was not able to be extubated yesterday because of poor respiratory volumes. She is awake and alert again this morning and will try again to see if she can come off the ventilator. She's not having much trouble with secretions now.  Objective: Vital signs in last 24 hours: Temp:  [97 F (36.1 C)-100 F (37.8 C)] 97.5 F (36.4 C) (08/07 0730) Pulse Rate:  [58-109] 66 (08/07 0730) Resp:  [14-31] 17 (08/07 0730) BP: (134-199)/(47-118) 165/62 (08/07 0730) SpO2:  [90 %-100 %] 99 % (08/07 0806) FiO2 (%):  [35 %-40 %] 35 % (08/07 0806) Weight:  [72.7 kg (160 lb 4.4 oz)] 72.7 kg (160 lb 4.4 oz) (08/07 0500) Weight change: 1.939 kg (4 lb 4.4 oz) Last BM Date:  (unknown)  Intake/Output from previous day: 08/06 0701 - 08/07 0700 In: 3305.2 [I.V.:3055.2; IV Piggyback:250] Out: 2100 [Urine:2100]  PHYSICAL EXAM General appearance: alert, cooperative, no distress and Intubated sedated on mechanical ventilation Resp: rhonchi bilaterally Cardio: regular rate and rhythm, S1, S2 normal, no murmur, click, rub or gallop GI: soft, non-tender; bowel sounds normal; no masses,  no organomegaly Extremities: extremities normal, atraumatic, no cyanosis or edema Skin warm and dry  Lab Results:  Results for orders placed or performed during the hospital encounter of 10/15/16 (from the past 48 hour(s))  CBG monitoring, ED     Status: Abnormal   Collection Time: 10/15/16  3:45 PM  Result Value Ref Range   Glucose-Capillary 225 (H) 65 - 99 mg/dL  I-Stat CG4 Lactic Acid, ED     Status: Abnormal   Collection Time: 10/15/16  4:04 PM  Result Value Ref Range   Lactic Acid, Venous 3.14 (HH) 0.5 - 1.9 mmol/L   Comment NOTIFIED PHYSICIAN   I-stat chem 8, ed     Status: Abnormal   Collection Time: 10/15/16  4:05 PM  Result Value Ref Range   Sodium 143 135 - 145 mmol/L   Potassium 3.6 3.5 - 5.1 mmol/L   Chloride 105 101 - 111 mmol/L   BUN 32 (H) 6 - 20 mg/dL   Creatinine, Ser 1.90 (H) 0.44 -  1.00 mg/dL   Glucose, Bld 231 (H) 65 - 99 mg/dL   Calcium, Ion 1.22 1.15 - 1.40 mmol/L   TCO2 25 0 - 100 mmol/L   Hemoglobin 11.2 (L) 12.0 - 15.0 g/dL   HCT 33.0 (L) 36.0 - 46.0 %  Urinalysis, Routine w reflex microscopic     Status: Abnormal   Collection Time: 10/15/16  4:22 PM  Result Value Ref Range   Color, Urine AMBER (A) YELLOW    Comment: BIOCHEMICALS MAY BE AFFECTED BY COLOR   APPearance CLEAR CLEAR   Specific Gravity, Urine >1.030 (H) 1.005 - 1.030   pH 6.0 5.0 - 8.0   Glucose, UA NEGATIVE NEGATIVE mg/dL   Hgb urine dipstick NEGATIVE NEGATIVE   Bilirubin Urine SMALL (A) NEGATIVE   Ketones, ur TRACE (A) NEGATIVE mg/dL   Protein, ur 100 (A) NEGATIVE mg/dL   Nitrite NEGATIVE NEGATIVE   Leukocytes, UA NEGATIVE NEGATIVE  Urinalysis, Microscopic (reflex)     Status: Abnormal   Collection Time: 10/15/16  4:22 PM  Result Value Ref Range   RBC / HPF 0-5 0 - 5 RBC/hpf   WBC, UA 6-30 0 - 5 WBC/hpf   Bacteria, UA MANY (A) NONE SEEN   Squamous Epithelial / LPF 6-30 (A) NONE SEEN   Mucous PRESENT   Blood Culture (routine x 2)  Status: None (Preliminary result)   Collection Time: 10/15/16  4:35 PM  Result Value Ref Range   Specimen Description BLOOD LEFT HAND    Special Requests      Blood Culture results may not be optimal due to an inadequate volume of blood received in culture bottles   Culture NO GROWTH 2 DAYS    Report Status PENDING   Blood Culture (routine x 2)     Status: None (Preliminary result)   Collection Time: 10/15/16  4:45 PM  Result Value Ref Range   Specimen Description BLOOD LEFT WRIST    Special Requests      Blood Culture results may not be optimal due to an inadequate volume of blood received in culture bottles   Culture NO GROWTH 2 DAYS    Report Status PENDING   Comprehensive metabolic panel     Status: Abnormal   Collection Time: 10/15/16  4:52 PM  Result Value Ref Range   Sodium 144 135 - 145 mmol/L   Potassium 3.5 3.5 - 5.1 mmol/L   Chloride  107 101 - 111 mmol/L   CO2 25 22 - 32 mmol/L   Glucose, Bld 233 (H) 65 - 99 mg/dL   BUN 28 (H) 6 - 20 mg/dL   Creatinine, Ser 1.91 (H) 0.44 - 1.00 mg/dL   Calcium 9.2 8.9 - 10.3 mg/dL   Total Protein 6.9 6.5 - 8.1 g/dL   Albumin 3.7 3.5 - 5.0 g/dL   AST 29 15 - 41 U/L   ALT 22 14 - 54 U/L   Alkaline Phosphatase 96 38 - 126 U/L   Total Bilirubin 0.4 0.3 - 1.2 mg/dL   GFR calc non Af Amer 26 (L) >60 mL/min   GFR calc Af Amer 30 (L) >60 mL/min    Comment: (NOTE) The eGFR has been calculated using the CKD EPI equation. This calculation has not been validated in all clinical situations. eGFR's persistently <60 mL/min signify possible Chronic Kidney Disease.    Anion gap 12 5 - 15  CBC WITH DIFFERENTIAL     Status: Abnormal   Collection Time: 10/15/16  4:52 PM  Result Value Ref Range   WBC 6.7 4.0 - 10.5 K/uL   RBC 4.49 3.87 - 5.11 MIL/uL   Hemoglobin 10.2 (L) 12.0 - 15.0 g/dL   HCT 34.2 (L) 36.0 - 46.0 %   MCV 76.2 (L) 78.0 - 100.0 fL   MCH 22.7 (L) 26.0 - 34.0 pg   MCHC 29.8 (L) 30.0 - 36.0 g/dL   RDW 14.7 11.5 - 15.5 %   Platelets 244 150 - 400 K/uL   Neutrophils Relative % 57 %   Neutro Abs 3.9 1.7 - 7.7 K/uL   Lymphocytes Relative 32 %   Lymphs Abs 2.2 0.7 - 4.0 K/uL   Monocytes Relative 8 %   Monocytes Absolute 0.5 0.1 - 1.0 K/uL   Eosinophils Relative 3 %   Eosinophils Absolute 0.2 0.0 - 0.7 K/uL   Basophils Relative 0 %   Basophils Absolute 0.0 0.0 - 0.1 K/uL  Urine rapid drug screen (hosp performed)     Status: Abnormal   Collection Time: 10/15/16  5:00 PM  Result Value Ref Range   Opiates NONE DETECTED NONE DETECTED   Cocaine NONE DETECTED NONE DETECTED   Benzodiazepines POSITIVE (A) NONE DETECTED   Amphetamines NONE DETECTED NONE DETECTED   Tetrahydrocannabinol NONE DETECTED NONE DETECTED   Barbiturates NONE DETECTED NONE DETECTED    Comment:  DRUG SCREEN FOR MEDICAL PURPOSES ONLY.  IF CONFIRMATION IS NEEDED FOR ANY PURPOSE, NOTIFY LAB WITHIN 5  DAYS.        LOWEST DETECTABLE LIMITS FOR URINE DRUG SCREEN Drug Class       Cutoff (ng/mL) Amphetamine      1000 Barbiturate      200 Benzodiazepine   778 Tricyclics       242 Opiates          300 Cocaine          300 THC              50   Blood gas, arterial ([facility] ONLY)     Status: Abnormal   Collection Time: 10/15/16  5:50 PM  Result Value Ref Range   FIO2 1.00    O2 Content 100.0 L/min   Delivery systems VENTILATOR    Mode PRESSURE REGULATED VOLUME CONTROL    VT 350 mL   LHR 14 resp/min   Peep/cpap 5.0 cm H20   pH, Arterial 7.246 (L) 7.350 - 7.450   pCO2 arterial 54.9 (H) 32.0 - 48.0 mmHg   pO2, Arterial 385 (H) 83.0 - 108.0 mmHg   Bicarbonate 21.2 20.0 - 28.0 mmol/L   Acid-base deficit 3.2 (H) 0.0 - 2.0 mmol/L   O2 Saturation 99.4 %   Collection site RIGHT RADIAL    Drawn by [phone]    Sample type ARTERIAL    Allens test (pass/fail) PASS PASS  I-Stat CG4 Lactic Acid, ED  (not at  [facility])     Status: None   Collection Time: 10/15/16  7:17 PM  Result Value Ref Range   Lactic Acid, Venous 1.38 0.5 - 1.9 mmol/L  Ethanol     Status: None   Collection Time: 10/15/16  7:33 PM  Result Value Ref Range   Alcohol, Ethyl (B) <5 <5 mg/dL    Comment:        LOWEST DETECTABLE LIMIT FOR SERUM ALCOHOL IS 5 mg/dL FOR MEDICAL PURPOSES ONLY   Procalcitonin - Baseline     Status: None   Collection Time: 10/15/16  7:33 PM  Result Value Ref Range   Procalcitonin <0.10 ng/mL    Comment:        Interpretation: PCT (Procalcitonin) <= 0.5 ng/mL: Systemic infection (sepsis) is not likely. Local bacterial infection is possible. (NOTE)         ICU PCT Algorithm               Non ICU PCT Algorithm    ----------------------------     ------------------------------         PCT < 0.25 ng/mL                 PCT < 0.1 ng/mL     Stopping of antibiotics            Stopping of antibiotics       strongly encouraged.               strongly encouraged.    ----------------------------      ------------------------------       PCT level decrease by               PCT < 0.25 ng/mL       >= 80% from peak PCT       OR PCT 0.25 - 0.5 ng/mL          Stopping of antibiotics  encouraged.     Stopping of antibiotics           encouraged.    ----------------------------     ------------------------------       PCT level decrease by              PCT >= 0.25 ng/mL       < 80% from peak PCT        AND PCT >= 0.5 ng/mL            Continuin g antibiotics                                              encouraged.       Continuing antibiotics            encouraged.    ----------------------------     ------------------------------     PCT level increase compared          PCT > 0.5 ng/mL         with peak PCT AND          PCT >= 0.5 ng/mL             Escalation of antibiotics                                          strongly encouraged.      Escalation of antibiotics        strongly encouraged.   Lithium level     Status: Abnormal   Collection Time: 10/15/16  7:33 PM  Result Value Ref Range   Lithium Lvl <0.06 (L) 0.60 - 1.20 mmol/L  Ammonia     Status: Abnormal   Collection Time: 10/15/16  7:33 PM  Result Value Ref Range   Ammonia 44 (H) 9 - 35 umol/L  Vitamin B12     Status: Abnormal   Collection Time: 10/15/16  7:33 PM  Result Value Ref Range   Vitamin B-12 2,322 (H) 180 - 914 pg/mL    Comment: (NOTE) This assay is not validated for testing neonatal or myeloproliferative syndrome specimens for Vitamin B12 levels. Performed at [facility], [place] [address] [place], [place] [address]   Folate     Status: None   Collection Time: 10/15/16  7:33 PM  Result Value Ref Range   Folate 52.2 >5.9 ng/mL    Comment: RESULTS CONFIRMED BY MANUAL DILUTION Performed at [facility], [place] [address], [place], [place] [address]   Iron and TIBC     Status: None   Collection Time: 10/15/16  7:33 PM  Result Value Ref Range   Iron 53 28 -  170 ug/dL   TIBC 400 250 - 450 ug/dL   Saturation Ratios 13 10.4 - 31.8 %   UIBC 347 ug/dL    Comment: Performed at [facility], [place] [address] [place], [place] [address]  Ferritin     Status: None   Collection Time: 10/15/16  7:33 PM  Result Value Ref Range   Ferritin 37 11 - 307 ng/mL    Comment: Performed at [facility], [place] [address] [place], [place] [address]  Reticulocytes     Status: None   Collection Time: 10/15/16  7:33  PM  Result Value Ref Range   Retic Ct Pct 1.9 0.4 - 3.1 %   RBC. 4.48 3.87 - 5.11 MIL/uL   Retic Count, Absolute 85.1 19.0 - 186.0 K/uL  Troponin I     Status: None   Collection Time: 10/15/16  7:33 PM  Result Value Ref Range   Troponin I <0.03 <0.03 ng/mL  MRSA PCR Screening     Status: None   Collection Time: 10/15/16  8:08 PM  Result Value Ref Range   MRSA by PCR NEGATIVE NEGATIVE    Comment:        The GeneXpert MRSA Assay (FDA approved for NASAL specimens only), is one component of a comprehensive MRSA colonization surveillance program. It is not intended to diagnose MRSA infection nor to guide or monitor treatment for MRSA infections.   Lactic acid, plasma     Status: None   Collection Time: 10/15/16  8:30 PM  Result Value Ref Range   Lactic Acid, Venous 1.7 0.5 - 1.9 mmol/L  Protime-INR     Status: None   Collection Time: 10/15/16  8:30 PM  Result Value Ref Range   Prothrombin Time 13.9 11.4 - 15.2 seconds   INR 1.07   APTT     Status: None   Collection Time: 10/15/16  8:30 PM  Result Value Ref Range   aPTT 27 24 - 36 seconds  Magnesium     Status: None   Collection Time: 10/15/16  8:30 PM  Result Value Ref Range   Magnesium 2.0 1.7 - 2.4 mg/dL  Troponin I     Status: None   Collection Time: 10/15/16  8:30 PM  Result Value Ref Range   Troponin I <0.03 <0.03 ng/mL  Hemoglobin A1c     Status: Abnormal   Collection Time: 10/15/16  8:31 PM  Result Value Ref Range   Hgb A1c MFr Bld 6.3 (H) 4.8 - 5.6 %    Comment:  (NOTE)         Pre-diabetes: 5.7 - 6.4         Diabetes: >6.4         Glycemic control for adults with diabetes: <7.0    Mean Plasma Glucose 134 mg/dL    Comment: (NOTE) Performed At: [facility] [place], [place] [id number] Lindon Romp MD YJ:[id number]   TSH     Status: None   Collection Time: 10/15/16  8:31 PM  Result Value Ref Range   TSH 0.827 0.350 - 4.500 uIU/mL    Comment: Performed by a 3rd Generation assay with a functional sensitivity of <=0.01 uIU/mL.  D-dimer, quantitative (not at [facility])     Status: None   Collection Time: 10/15/16  8:31 PM  Result Value Ref Range   D-Dimer, Quant 0.28 0.00 - 0.50 ug/mL-FEU    Comment: (NOTE) At the manufacturer cut-off of 0.50 ug/mL FEU, this assay has been documented to exclude PE with a sensitivity and negative predictive value of 97 to 99%.  At this time, this assay has not been approved by the FDA to exclude DVT/VTE. Results should be correlated with clinical presentation.   Culture, respiratory (NON-Expectorated)     Status: None (Preliminary result)   Collection Time: 10/15/16  9:00 PM  Result Value Ref Range   Specimen Description SPUTUM    Special Requests NONE    Gram Stain      MODERATE WBC PRESENT,BOTH PMN AND MONONUCLEAR NO SQUAMOUS EPITHELIAL CELLS SEEN NO ORGANISMS SEEN  Performed at [facility], [place] [address] [place], [place] [address]    Culture PENDING    Report Status PENDING   Blood gas, arterial     Status: Abnormal   Collection Time: 10/15/16  9:22 PM  Result Value Ref Range   FIO2 40.00    Delivery systems VENTILATOR    Mode PRESSURE REGULATED VOLUME CONTROL    VT 400 mL   LHR 16 resp/min   Peep/cpap 5.0 cm H20   pH, Arterial 7.300 (L) 7.350 - 7.450   pCO2 arterial 45.9 32.0 - 48.0 mmHg   pO2, Arterial 109.0 (H) 83.0 - 108.0 mmHg   Bicarbonate 21.2 20.0 - 28.0 mmol/L   Acid-Base Excess 3.5 (H) 0.0 - 2.0 mmol/L   O2 Saturation 97.7 %   Patient temperature 36.5     Collection site RIGHT RADIAL    Drawn by [id number]    Sample type ARTERIAL    Allens test (pass/fail) PASS PASS  Glucose, capillary     Status: Abnormal   Collection Time: 10/15/16  9:35 PM  Result Value Ref Range   Glucose-Capillary 208 (H) 65 - 99 mg/dL   Comment 1 Notify RN   Glucose, capillary     Status: Abnormal   Collection Time: 10/16/16 12:12 AM  Result Value Ref Range   Glucose-Capillary 144 (H) 65 - 99 mg/dL   Comment 1 Notify RN   Lactic acid, plasma     Status: Abnormal   Collection Time: 10/16/16  3:32 AM  Result Value Ref Range   Lactic Acid, Venous 2.5 (HH) 0.5 - 1.9 mmol/L    Comment: CRITICAL RESULT CALLED TO, READ BACK BY AND VERIFIED WITH: WAGONER,R AT 5:40AM ON 10/16/16 BY FESTERMAN,C   Troponin I     Status: None   Collection Time: 10/16/16  3:32 AM  Result Value Ref Range   Troponin I <0.03 <0.03 ng/mL  Comprehensive metabolic panel     Status: Abnormal   Collection Time: 10/16/16  3:32 AM  Result Value Ref Range   Sodium 140 135 - 145 mmol/L   Potassium 4.1 3.5 - 5.1 mmol/L   Chloride 112 (H) 101 - 111 mmol/L   CO2 20 (L) 22 - 32 mmol/L   Glucose, Bld 185 (H) 65 - 99 mg/dL   BUN 19 6 - 20 mg/dL   Creatinine, Ser 0.93 0.44 - 1.00 mg/dL   Calcium 8.3 (L) 8.9 - 10.3 mg/dL   Total Protein 6.2 (L) 6.5 - 8.1 g/dL   Albumin 3.2 (L) 3.5 - 5.0 g/dL   AST 27 15 - 41 U/L   ALT 19 14 - 54 U/L   Alkaline Phosphatase 86 38 - 126 U/L   Total Bilirubin 0.3 0.3 - 1.2 mg/dL   GFR calc non Af Amer >60 >60 mL/min   GFR calc Af Amer >60 >60 mL/min    Comment: (NOTE) The eGFR has been calculated using the CKD EPI equation. This calculation has not been validated in all clinical situations. eGFR's persistently <60 mL/min signify possible Chronic Kidney Disease.    Anion gap 8 5 - 15  CBC     Status: Abnormal   Collection Time: 10/16/16  3:32 AM  Result Value Ref Range   WBC 5.3 4.0 - 10.5 K/uL   RBC 4.17 3.87 - 5.11 MIL/uL   Hemoglobin 9.5 (L) 12.0 - 15.0 g/dL    HCT 31.7 (L) 36.0 - 46.0 %   MCV 76.0 (L) 78.0 - 100.0  fL   MCH 22.8 (L) 26.0 - 34.0 pg   MCHC 30.0 30.0 - 36.0 g/dL   RDW 14.2 11.5 - 15.5 %   Platelets 217 150 - 400 K/uL  Glucose, capillary     Status: Abnormal   Collection Time: 10/16/16  3:47 AM  Result Value Ref Range   Glucose-Capillary 182 (H) 65 - 99 mg/dL   Comment 1 Notify RN   Glucose, capillary     Status: Abnormal   Collection Time: 10/16/16  7:49 AM  Result Value Ref Range   Glucose-Capillary 122 (H) 65 - 99 mg/dL  Troponin I     Status: None   Collection Time: 10/16/16  9:32 AM  Result Value Ref Range   Troponin I <0.03 <0.03 ng/mL  Ammonia     Status: Abnormal   Collection Time: 10/16/16  9:32 AM  Result Value Ref Range   Ammonia 40 (H) 9 - 35 umol/L  Glucose, capillary     Status: Abnormal   Collection Time: 10/16/16 11:35 AM  Result Value Ref Range   Glucose-Capillary 129 (H) 65 - 99 mg/dL  Blood gas, arterial     Status: Abnormal   Collection Time: 10/16/16  1:18 PM  Result Value Ref Range   FIO2 40.00    Delivery systems VENTILATOR    Mode PRESSURE SUPPORT    Peep/cpap 5.0 cm H20   Pressure support 10 cm H20   pH, Arterial 7.278 (L) 7.350 - 7.450   pCO2 arterial 45.3 32.0 - 48.0 mmHg   pO2, Arterial 112 (H) 83.0 - 108.0 mmHg   Bicarbonate 19.8 (L) 20.0 - 28.0 mmol/L   Acid-base deficit 5.2 (H) 0.0 - 2.0 mmol/L   O2 Saturation 97.5 %   Patient temperature 37.1    Collection site LEFT RADIAL    Drawn by [id number]    Sample type ARTERIAL DRAW    Allens test (pass/fail) PASS PASS  Glucose, capillary     Status: Abnormal   Collection Time: 10/16/16  4:31 PM  Result Value Ref Range   Glucose-Capillary 159 (H) 65 - 99 mg/dL  Glucose, capillary     Status: Abnormal   Collection Time: 10/16/16  7:56 PM  Result Value Ref Range   Glucose-Capillary 140 (H) 65 - 99 mg/dL  Glucose, capillary     Status: Abnormal   Collection Time: 10/17/16 12:05 AM  Result Value Ref Range   Glucose-Capillary 130 (H) 65  - 99 mg/dL  Glucose, capillary     Status: Abnormal   Collection Time: 10/17/16  4:12 AM  Result Value Ref Range   Glucose-Capillary 191 (H) 65 - 99 mg/dL  Procalcitonin     Status: None   Collection Time: 10/17/16  5:58 AM  Result Value Ref Range   Procalcitonin <0.10 ng/mL    Comment:        Interpretation: PCT (Procalcitonin) <= 0.5 ng/mL: Systemic infection (sepsis) is not likely. Local bacterial infection is possible. (NOTE)         ICU PCT Algorithm               Non ICU PCT Algorithm    ----------------------------     ------------------------------         PCT < 0.25 ng/mL                 PCT < 0.1 ng/mL     Stopping of antibiotics            Stopping of  antibiotics       strongly encouraged.               strongly encouraged.    ----------------------------     ------------------------------       PCT level decrease by               PCT < 0.25 ng/mL       >= 80% from peak PCT       OR PCT 0.25 - 0.5 ng/mL          Stopping of antibiotics                                             encouraged.     Stopping of antibiotics           encouraged.    ----------------------------     ------------------------------       PCT level decrease by              PCT >= 0.25 ng/mL       < 80% from peak PCT        AND PCT >= 0.5 ng/mL            Continuin g antibiotics                                              encouraged.       Continuing antibiotics            encouraged.    ----------------------------     ------------------------------     PCT level increase compared          PCT > 0.5 ng/mL         with peak PCT AND          PCT >= 0.5 ng/mL             Escalation of antibiotics                                          strongly encouraged.      Escalation of antibiotics        strongly encouraged.   CBC with Differential/Platelet     Status: Abnormal   Collection Time: 10/17/16  5:58 AM  Result Value Ref Range   WBC 9.6 4.0 - 10.5 K/uL   RBC 4.04 3.87 - 5.11 MIL/uL    Hemoglobin 9.2 (L) 12.0 - 15.0 g/dL   HCT 30.3 (L) 36.0 - 46.0 %   MCV 75.0 (L) 78.0 - 100.0 fL   MCH 22.8 (L) 26.0 - 34.0 pg   MCHC 30.4 30.0 - 36.0 g/dL   RDW 14.3 11.5 - 15.5 %   Platelets 219 150 - 400 K/uL   Neutrophils Relative % 90 %   Neutro Abs 8.7 (H) 1.7 - 7.7 K/uL   Lymphocytes Relative 6 %   Lymphs Abs 0.5 (L) 0.7 - 4.0 K/uL   Monocytes Relative 4 %   Monocytes Absolute 0.4 0.1 - 1.0 K/uL   Eosinophils Relative 0 %   Eosinophils Absolute 0.0 0.0 - 0.7 K/uL   Basophils Relative 0 %  Basophils Absolute 0.0 0.0 - 0.1 K/uL  Comprehensive metabolic panel     Status: Abnormal   Collection Time: 10/17/16  5:58 AM  Result Value Ref Range   Sodium 138 135 - 145 mmol/L   Potassium 3.8 3.5 - 5.1 mmol/L   Chloride 109 101 - 111 mmol/L   CO2 22 22 - 32 mmol/L   Glucose, Bld 198 (H) 65 - 99 mg/dL   BUN 10 6 - 20 mg/dL   Creatinine, Ser 0.65 0.44 - 1.00 mg/dL   Calcium 8.0 (L) 8.9 - 10.3 mg/dL   Total Protein 6.5 6.5 - 8.1 g/dL   Albumin 3.2 (L) 3.5 - 5.0 g/dL   AST 24 15 - 41 U/L   ALT 18 14 - 54 U/L   Alkaline Phosphatase 77 38 - 126 U/L   Total Bilirubin 0.6 0.3 - 1.2 mg/dL   GFR calc non Af Amer >60 >60 mL/min   GFR calc Af Amer >60 >60 mL/min    Comment: (NOTE) The eGFR has been calculated using the CKD EPI equation. This calculation has not been validated in all clinical situations. eGFR's persistently <60 mL/min signify possible Chronic Kidney Disease.    Anion gap 7 5 - 15  Glucose, capillary     Status: Abnormal   Collection Time: 10/17/16  7:59 AM  Result Value Ref Range   Glucose-Capillary 169 (H) 65 - 99 mg/dL   *Note: Due to a large number of results and/or encounters for the requested time period, some results have not been displayed. A complete set of results can be found in Results Review.    ABGS  Recent Labs  10/15/16 1605  10/16/16 1318  PHART  --   < > 7.278*  PO2ART  --   < > 112*  TCO2 25  --   --   HCO3  --   < > 19.8*  < > = values  in this interval not displayed. CULTURES Recent Results (from the past 240 hour(s))  Blood Culture (routine x 2)     Status: None (Preliminary result)   Collection Time: 10/15/16  4:35 PM  Result Value Ref Range Status   Specimen Description BLOOD LEFT HAND  Final   Special Requests   Final    Blood Culture results may not be optimal due to an inadequate volume of blood received in culture bottles   Culture NO GROWTH 2 DAYS  Final   Report Status PENDING  Incomplete  Blood Culture (routine x 2)     Status: None (Preliminary result)   Collection Time: 10/15/16  4:45 PM  Result Value Ref Range Status   Specimen Description BLOOD LEFT WRIST  Final   Special Requests   Final    Blood Culture results may not be optimal due to an inadequate volume of blood received in culture bottles   Culture NO GROWTH 2 DAYS  Final   Report Status PENDING  Incomplete  MRSA PCR Screening     Status: None   Collection Time: 10/15/16  8:08 PM  Result Value Ref Range Status   MRSA by PCR NEGATIVE NEGATIVE Final    Comment:        The GeneXpert MRSA Assay (FDA approved for NASAL specimens only), is one component of a comprehensive MRSA colonization surveillance program. It is not intended to diagnose MRSA infection nor to guide or monitor treatment for MRSA infections.   Culture, respiratory (NON-Expectorated)     Status: None (Preliminary result)  Collection Time: 10/15/16  9:00 PM  Result Value Ref Range Status   Specimen Description SPUTUM  Final   Special Requests NONE  Final   Gram Stain   Final    MODERATE WBC PRESENT,BOTH PMN AND MONONUCLEAR NO SQUAMOUS EPITHELIAL CELLS SEEN NO ORGANISMS SEEN Performed at [facility], [address] [place], [place] [address]    Culture PENDING  Incomplete   Report Status PENDING  Incomplete   Studies/Results: Dg Abd 1 View  Result Date: 10/16/2016 CLINICAL DATA:  Abdominal pain. EXAM: ABDOMEN - 1 VIEW COMPARISON:  10/10/2016 FINDINGS:  Cholecystectomy clips are identified within the right upper quadrant. There is a nasogastric tube with tip in the distal stomach. The bowel gas pattern is nonobstructed. No dilated loops of small or large bowel. IMPRESSION: 1. Nonobstructive bowel gas pattern. Electronically Signed   By: Kerby Moors M.D.   On: 10/16/2016 15:15   Ct Head Wo Contrast  Result Date: 10/15/2016 CLINICAL DATA:  Pt c/o upper back pain after fall 3 days ago. Pt denies LOC upon fall. Pt's BP in triage 87/52. Pt falling asleep in triage, but easily arouses. Pt reports she doesn't know why she's so sleepy. Denies dizziness, weakness. EXAM: CT HEAD WITHOUT CONTRAST TECHNIQUE: Contiguous axial images were obtained from the base of the skull through the vertex without intravenous contrast. COMPARISON:  02/17/2016, 05/01/2015 FINDINGS: Brain: There is encephalomalacia and postsurgical change involving the frontal lobes bilaterally. 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 Minimal soft tissue is identified within this same location, less apparent without intravenous contrast. There is no significant mass effect or edema in this region. No new mass or evidence for acute infarction. There is mild central and cortical atrophy. Vascular: There is dense atherosclerotic calcification of the internal carotid arteries. Skull: Status post bifrontal craniotomy. No acute calvarial abnormality. Sinuses/Orbits: No acute finding. Other: None IMPRESSION: 1. Status post bifrontal craniotomy. 2. Soft tissue density in the region of the known planum sphenoidale meningioma shows no evidence for edema or mass effect by CT. Consider MRI for more accurate comparison. 3. No evidence for acute injury. Electronically Signed   By: Nolon Nations M.D.   On: 10/15/2016 17:56   Portable Chest 1 View  Result Date: 10/16/2016 CLINICAL DATA:  Hypoxia EXAM: PORTABLE CHEST 1 VIEW  COMPARISON:  October 15, 2016 FINDINGS: Endotracheal tube tip is 5.1 cm above the carina. Nasogastric tube tip and side port are below the diaphragm. No pneumothorax. There is consolidation in the right lung base. Lungs elsewhere clear. Heart is borderline enlarged with pulmonary vascularity within normal limits. No adenopathy. No bone lesions. IMPRESSION: Airspace consolidation right base. Lungs elsewhere clear. Stable cardiac prominence. Tube positions as described without pneumothorax. Electronically Signed   By: Lowella Grip III M.D.   On: 10/16/2016 07:20   Dg Chest Portable 1 View  Result Date: 10/15/2016 CLINICAL DATA:  Code sepsis.  Life support line placement. EXAM: PORTABLE CHEST 1 VIEW COMPARISON:  Chest radiograph September 10, 2016 FINDINGS: Endotracheal tube tip projects 2.2 cm above the carina. Nasogastric tube past GE junction, distal tip not included. Patient rotated to the RIGHT accentuating vascular markings. RIGHT lung base strandy densities. Low inspiratory examination without pleural effusion or focal consolidation. Stable RIGHT midlung zone scarring. No pneumothorax. Soft tissue planes and included osseous structures are unchanged. IMPRESSION: Endotracheal tube tip projects 2.2 cm above the carina. Nasogastric tube past GE junction,  distal tip not included. Low inspiratory examination of the RIGHT mid and lower lung zone atelectasis/ scarring. Electronically Signed   By: Elon Alas M.D.   On: 10/15/2016 16:53    Medications:  Prior to Admission:  Prescriptions Prior to Admission  Medication Sig Dispense Refill Last Dose  . amLODipine (NORVASC) 5 MG tablet TAKE 2 TABLETS BY MOUTH ONCE DAILY. 60 tablet 0 unknown at Unknown time  . diclofenac sodium (VOLTAREN) 1 % GEL Apply 2 g topically daily as needed (Pain). 100 g 2 unknown at Unknown time  . dicyclomine (BENTYL) 10 MG capsule Take 10 mg by mouth 4 (four) times daily -  before meals and at bedtime.   unknown at Unknown time  .  hydroxychloroquine (PLAQUENIL) 200 MG tablet Take 200 mg by mouth 2 (two) times daily.    unknown at Unknown time  . lamoTRIgine (LAMICTAL) 100 MG tablet Take 1 tablet (100 mg total) by mouth 2 (two) times daily. 60 tablet 5 unknown at Unknown time  . LORazepam (ATIVAN) 0.5 MG tablet Take 1 tablet (0.5 mg total) by mouth 3 (three) times daily. 90 tablet 2 unknown at Unknown time  . losartan (COZAAR) 50 MG tablet TAKE ONE TABLET BY MOUTH DAILY. 90 tablet 1 unknown at Unknown time  . ondansetron (ZOFRAN ODT) 8 MG disintegrating tablet Take 1 tablet (8 mg total) by mouth every 8 (eight) hours as needed for nausea or vomiting. 10 tablet 0 unknown at Unknown time  . pregabalin (LYRICA) 75 MG capsule Take 75 mg by mouth 2 (two) times daily.    unknown at Unknown time  . RABEprazole (ACIPHEX) 20 MG tablet Take 1 tablet (20 mg total) by mouth 2 (two) times daily. 180 tablet 1 unknown at Unknown time  . RESTASIS 0.05 % ophthalmic emulsion Place 1 drop into both eyes 2 (two) times daily.    unknown at Unknown time  . risperiDONE (RISPERDAL) 0.5 MG tablet Take 1 tablet (0.5 mg total) by mouth at bedtime. 90 tablet 2 unknown at Unknown time  . sertraline (ZOLOFT) 100 MG tablet Take 2 tablets (200 mg total) by mouth daily. 180 tablet 2 unknown at Unknown time  . traZODone (DESYREL) 100 MG tablet Take 2 tablets (200 mg total) by mouth at bedtime. 180 tablet 2 unknown at Unknown time  . ACCU-CHEK FASTCLIX LANCETS MISC Use to test blood sugar 4 times daily. Dx: E10.65 408 each 1 Taking  . ACCU-CHEK SMARTVIEW test strip TEST BLOOD SUGAR FOUR TIMES DAILY AS DIRECTED 350 each 2 Taking  . albuterol (PROVENTIL HFA;VENTOLIN HFA) 108 (90 Base) MCG/ACT inhaler Inhale 1-2 puffs every 6 hours as needed for wheezing, shortness of breath 3 Inhaler 3 Taking  . Alcohol Swabs (B-D SINGLE USE SWABS REGULAR) PADS Use for injections and glucose testing 8 times daily. Dx: E10.65 900 each 1 Taking  . aspirin EC 81 MG tablet Take 81 mg  by mouth daily.   Taking  . Azelastine-Fluticasone (DYMISTA) 137-50 MCG/ACT SUSP Place 1 puff into the nose at bedtime. 1 Bottle 0 Taking  . BD PEN NEEDLE NANO U/F 32G X 4 MM MISC USE FOUR TIMES DAILY 360 each 3 Taking  . Blood Glucose Calibration (ACCU-CHEK SMARTVIEW CONTROL) LIQD 1 each by Other route as needed. 1 each 2 Taking  . Blood Glucose Monitoring Suppl (ACCU-CHEK NANO SMARTVIEW) W/DEVICE KIT 1 each by Does not apply route daily. Dx: E10.65 (Patient not taking: Reported on 09/27/2016) 1 kit 0 Not Taking  . Cholecalciferol (  VITAMIN D) 2000 units CAPS Take 1 capsule by mouth daily.   Taking  . clobetasol cream (TEMOVATE) 7.51 % Apply 1 application topically daily.   Taking  . cloNIDine (CATAPRES) 0.3 MG tablet TAKE 1 TABLET BY MOUTH EVERY EIGHT HOURS. ONCE AT 8AM, 4PM, AND 12 MIDNIGHT. 270 tablet 0 Taking  . Coenzyme Q-10 100 MG capsule Take 1 capsule (100 mg total) by mouth 3 (three) times daily. 90 capsule 5 Taking  . Insulin Glargine (TOUJEO SOLOSTAR) 300 UNIT/ML SOPN Inject 25 Units into the skin at bedtime. 6 pen 3 Taking  . levothyroxine (SYNTHROID, LEVOTHROID) 50 MCG tablet TAKE 1 TABLET BY MOUTH ONCE DAILY AND 1/2 TABLET ON SUNDAYS. 30 tablet 0 Taking  . metoprolol tartrate (LOPRESSOR) 50 MG tablet Take 1 tablet (50 mg total) by mouth 2 (two) times daily. Please call and schedule appointment 60 tablet 0 Taking  . montelukast (SINGULAIR) 10 MG tablet TAKE ONE TABLET BY MOUTH ONCE DAILY. 90 tablet 0 Taking  . Multiple Vitamins-Minerals (ONE-A-DAY 50 PLUS PO) Take 1 tablet by mouth daily.   Taking  . NOVOLOG FLEXPEN 100 UNIT/ML FlexPen INJECT 16 TO 30 UNITS INTO THE SKIN 3 (THREE) TIMES DAILY WITH MEALS. 90 mL 2 Taking  . nystatin (MYCOSTATIN/NYSTOP) powder APPLY TO AFFECTED AREA FOUR TIMES DAILY. 30 g 2 Taking  . Riboflavin 400 MG TABS Take 400 mg by mouth daily. (Patient not taking: Reported on 09/27/2016) 30 tablet 5 Not Taking  . rosuvastatin (CRESTOR) 5 MG tablet TAKE 1 TABLET BY  MOUTH AT BEDTIME. 90 tablet 0 Taking  . vitamin B-12 (CYANOCOBALAMIN) 1000 MCG tablet Take 1,000 mcg by mouth daily.   Taking   Scheduled: . amLODipine  10 mg Per Tube Daily  . budesonide (PULMICORT) nebulizer solution  0.5 mg Nebulization BID  . chlorhexidine gluconate (MEDLINE KIT)  15 mL Mouth Rinse BID  . cloNIDine  0.3 mg Per Tube TID  . enoxaparin (LOVENOX) injection  40 mg Subcutaneous Q24H  . insulin aspart  0-9 Units Subcutaneous Q4H  . ipratropium-albuterol  3 mL Nebulization Q4H  . lactulose  30 g Oral BID  . lamoTRIgine  100 mg Per Tube BID  . levothyroxine  25 mcg Intravenous QAC breakfast  . mouth rinse  15 mL Mouth Rinse QID  . methylPREDNISolone (SOLU-MEDROL) injection  80 mg Intravenous Q12H  . metoprolol tartrate  5 mg Intravenous Q8H  . pantoprazole (PROTONIX) IV  40 mg Intravenous Q24H  . sertraline  200 mg Per Tube Daily   Continuous: . sodium chloride 1,000 mL (10/16/16 2230)  . aztreonam Stopped (10/16/16 2350)  . fentaNYL infusion INTRAVENOUS 100 mcg/hr (10/17/16 0700)  . levofloxacin (LEVAQUIN) IV Stopped (10/16/16 1955)   [facility]:[id number], LORazepam, ondansetron **OR** ondansetron (ZOFRAN) IV  Assesment: She was admitted with altered mental status acute hypercapnic respiratory failure probable aspiration pneumonia pulmonary hypertension and acute kidney injury. She is better. At baseline she has multiple other medical problems including sleep apnea bipolar disease history of stroke seizure disorder COPD diabetes hypertension. I'm not sure what her baseline is. She is awake and able to answer questions this morning again. Principal Problem:   Acute respiratory failure with hypercapnia (HCC) Active Problems:   Hyperlipidemia   GERD   Obstructive sleep apnea   Constipation   Polypharmacy   Bipolar disorder (HCC)   Hemiplegia and hemiparesis following cerebral infarction affecting unspecified side (HCC)   Seizure disorder (HCC)   Gastroparesis    Hypothyroidism   COPD (chronic obstructive  pulmonary disease) with chronic bronchitis (HCC)   Microcytic anemia   Pulmonary hypertension (HCC)   Poorly controlled type 2 diabetes mellitus with circulatory disorder (HCC)   Labile hypertension   Class 1 obesity with serious comorbidity and body mass index (BMI) of 33.0 to 33.9 in adult   Diabetic polyneuropathy associated with type 2 diabetes mellitus ([place])   Acute metabolic encephalopathy   AKI (acute kidney injury) ([place])   HCAP (healthcare-associated pneumonia)/Aspiration PNA    Plan: Try again to wean to extubation    LOS: 2 days   Anglia Blakley L 10/17/2016, 8:38 AM

## 2016-10-17 NOTE — Progress Notes (Signed)
PROGRESS NOTE    [name]  FIE:[id number] DOB: 08/02/1950 DOA: 10/15/2016 PCP: [name], MD    Brief Narrative:  [name] is a 66 y.o. female with medical history significant of COPD, OSA, CVA with unclear hemiplegia per chart, IDDM, bipolar disorder dropped off in the ED waiting area by family  Members earlier today for altered mental status.  Pt was found to be sleepy hypotensive and was intubated in the ED for airway protection.  No history can be obtained from patient and no family present.  Pt is lightly sedated at this time, she arouses to voice and her ble seem to have muscle wasting , unknown if she is chronically bedbound or what her baseline functional status is.  Her bp has normalized with 30cc/kg ivf bolus in ED per sepsis protocol.  Pt referred for admission for acute respiratory failure of unclear etiology.   Assessment & Plan:   Principal Problem:   Acute respiratory failure with hypercapnia (HCC) Active Problems:   HCAP (healthcare-associated pneumonia)/Aspiration PNA   Hyperlipidemia   GERD   Obstructive sleep apnea   Constipation   Polypharmacy   Bipolar disorder (HCC)   Hemiplegia and hemiparesis following cerebral infarction affecting unspecified side (HCC)   Seizure disorder (HCC)   Gastroparesis   Hypothyroidism   COPD (chronic obstructive pulmonary disease) with chronic bronchitis (HCC)   Microcytic anemia   Pulmonary hypertension (HCC)   Poorly controlled type 2 diabetes mellitus with circulatory disorder (HCC)   Labile hypertension   Class 1 obesity with serious comorbidity and body mass index (BMI) of 33.0 to 33.9 in adult   Diabetic polyneuropathy associated with type 2 diabetes mellitus ([place])   Acute metabolic encephalopathy   AKI (acute kidney injury) ([place])  #1 acute respiratory failure with hypercapnia Likely secondary to healthcare associated pneumonia/aspiration pneumonia noted on repeat chest x-ray 10/16/2016 and probable acute  COPD exacerbation. Patient with some clinical improvement and likely to be weaned of ventilator today. Patient has been pancultured cultures pending. Continue IV Solu-Medrol, IV Levaquin, IV aztreonam, scheduled nebs, pulmicort. Discontinued IV vancomycin as MRSA PCR is negative. Pulmonary following and appreciate input and recommendations.  #2 healthcare associated pneumonia/aspiration pneumonia Repeat chest x-ray consistent with pneumonia in the right base likely associated with aspiration pneumonia. Sputum Gram stain and cultures are pending. Blood cultures pending. Patient currently afebrile. Patient with no leukocytosis. MRSA by PCR is negative and as such discontinued IV vancomycin. Continue IV Levaquin, IV aztreonam, scheduled nebulizer treatments.  #3 probable acute COPD exacerbation Patient had presented with acute respiratory failure with hypercapnia. Patient intubated and on ventilatory. Blood gas this morning pending. Patient has been seen by pulmonary and patient to be weaned off ventilator today. Continue IV steroids, IV Levaquin, IV aztreonam, Pulmicort and scheduled nebulizers. Pulmonary following and appreciate input and recommendations.  #4 hypothyroidism TSH is 0.827. Continue Synthroid 25 MCG's IV daily. Once patient is extubated and on a diet will transition back to oral Synthroid.  #5 constipation Patient stated hasn't had a bowel movement in greater than a week. Patient with a history of constipation. Abdominal films unremarkable. Patient with no results after soapsuds enema and ducolax suppository. Patient was placed on lactulose orally. Once patient is extubated and on oral diet will need to place on a bowel regimen.  #6  acute encephalopathy Questionable etiology. Likely metabolic in nature secondary to pneumonia and probably acute COPD exacerbation. Lithium level is low. CT head negative for any acute abnormalities. EEG  and MRI pending. Ammonia level was elevated yesterday  at 40. TSH within normal limits at 0.827. B-12 level is elevated at 2322. RBC folate at 52.2. Check RPR. Continue empiric IV antibiotics.  #7 gastroesophageal reflux disease Increase PPI to twice a day.  #8 acute kidney injury Likely secondary to a prerenal azotemia in the setting of ARB. ARB on hold. Renal function improved with hydration. Follow.  #9 hypertension Continue metoprolol 5 mg IV every 8 hours. Resume home regimen of clonidine, Norvasc via NG tube.   #10 bipolar disorder Lithium level low.resume Lamictal, Zoloft per NG tube.   #11 gastroparesis Follow.  #12 obstructive sleep apnea  #13  type 2 diabetes Hemoglobin A1c was 6.6 on 07/07/2016. CBGs have ranged from 130 -191. Continue every 4h CBGs.  #14 microcytic anemia Hemoglobin stable. Follow H&H.  #15 seizure disorder Resume Lamictal. EEG pending.     DVT prophylaxis: Lovenox Code Status: Full Family Communication: Updated patient. No family at bedside. Disposition Plan: Remain the step down unit. Disposition pending progress during hospitalization.   Consultants:   Pulmonary: Dr. Luan Pulling 10/16/2016  Procedures:   CT head 10/15/2016  Chest x-ray 10/15/2016, 10/16/2016, 10/17/2016    Antimicrobials:   IV aztreonam 10/15/2016  IV Levaquin 10/15/2016  IV vancomycin 10/15/2016>>>>> 10/16/2016   Subjective: Patient on the ventilator. Patient was to be extubated yesterday however due to poor respiratory volumes unable to be extubated. Patient following commands. Patient complaining of some chest discomfort. Patient denies shortness of breath.   Objective: Vitals:   10/17/16 0900 10/17/16 0915 10/17/16 0918 10/17/16 0930  BP: (!) 174/64 (!) 169/66  (!) 183/75  Pulse: 78 84  92  Resp: 19 (!) 24  (!) 21  Temp: (!) 97.3 F (36.3 C) (!) 97.3 F (36.3 C)  (!) 97.3 F (36.3 C)  TempSrc:      SpO2: 99% 94%  98%  Weight:      Height:   5' (1.524 m)     Intake/Output Summary (Last 24 hours)  at 10/17/16 0936 Last data filed at 10/17/16 0700  Gross per 24 hour  Intake          2821.47 ml  Output             2100 ml  Net           721.47 ml   Filed Weights   10/15/16 2017 10/16/16 0500 10/17/16 0500  Weight: 70.8 kg (156 lb 1.4 oz) 70.9 kg (156 lb 4.9 oz) 72.7 kg (160 lb 4.4 oz)    Examination:  General exam: Intubated. On ventilator. Respiratory system: Coarse breath sounds.Some expiratory wheezing.  Cardiovascular system: S1 & S2 heard, RRR. No JVD, murmurs, rubs, gallops or clicks. No pedal edema. Gastrointestinal system: Abdomen is nondistended, soft and some diffuse tenderness to palpation. No organomegaly or masses felt. Normal bowel sounds heard. Central nervous system: Patient opens eyes to verbal stimuli and following commands. Moving extremities spontaneously.  Extremities: Symmetric 5 x 5 power. Skin: No rashes, lesions or ulcers Psychiatry: Unable to assess as patient is on the ventilator.    Data Reviewed: I have personally reviewed following labs and imaging studies  CBC:  Recent Labs Lab 10/10/16 1812 10/15/16 1605 10/15/16 1652 10/16/16 0332 10/17/16 0558  WBC 6.4  --  6.7 5.3 9.6  NEUTROABS 3.8  --  3.9  --  8.7*  HGB 12.3 11.2* 10.2* 9.5* 9.2*  HCT 40.7 33.0* 34.2* 31.7* 30.3*  MCV 75.4*  --  76.2* 76.0* 75.0*  PLT 246  --  244 217 741   Basic Metabolic Panel:  Recent Labs Lab 10/10/16 1812 10/15/16 1605 10/15/16 1652 10/15/16 2030 10/16/16 0332 10/17/16 0558  NA 146* 143 144  --  140 138  K 3.5 3.6 3.5  --  4.1 3.8  CL 112* 105 107  --  112* 109  CO2 23  --  25  --  20* 22  GLUCOSE 212* 231* 233*  --  185* 198*  BUN 14 32* 28*  --  19 10  CREATININE 0.85 1.90* 1.91*  --  0.93 0.65  CALCIUM 9.9  --  9.2  --  8.3* 8.0*  MG  --   --   --  2.0  --   --    GFR: Estimated Creatinine Clearance: 61.6 mL/min (by C-G formula based on SCr of 0.65 mg/dL). Liver Function Tests:  Recent Labs Lab 10/10/16 1812 10/15/16 1652  10/16/16 0332 10/17/16 0558  AST 36 _0 ALT _1 ALKPHOS 151* 96 86 77  BILITOT 0.3 0.4 0.3 0.6  PROT 8.6* 6.9 6.2* 6.5  ALBUMIN 4.4 3.7 3.2* 3.2*    Recent Labs Lab 10/10/16 1828  LIPASE 25    Recent Labs Lab 10/15/16 1933 10/16/16 0932  AMMONIA 44* 40*   Coagulation Profile:  Recent Labs Lab 10/15/16 2030  INR 1.07   Cardiac Enzymes:  Recent Labs Lab 10/15/16 1933 10/15/16 2030 10/16/16 0332 10/16/16 0932  TROPONINI <0.03 <0.03 <0.03 <0.03   BNP (last 3 results) No results for input(s): PROBNP in the last 8760 hours. HbA1C:  Recent Labs  10/15/16 2031  HGBA1C 6.3*   CBG:  Recent Labs Lab 10/16/16 1631 10/16/16 1956 10/17/16 0005 10/17/16 0412 10/17/16 0759  GLUCAP 159* 140* 130* 191* 169*   Lipid Profile: No results for input(s): CHOL, HDL, LDLCALC, TRIG, CHOLHDL, LDLDIRECT in the last 72 hours. Thyroid Function Tests:  Recent Labs  10/15/16 2031  TSH 0.827   Anemia Panel:  Recent Labs  10/15/16 1933  VITAMINB12 2,322*  FOLATE 52.2  FERRITIN 37  TIBC 400  IRON 53  RETICCTPCT 1.9   Sepsis Labs:  Recent Labs Lab 10/15/16 1604 10/15/16 1917 10/15/16 1933 10/15/16 2030 10/16/16 0332 10/17/16 0558  PROCALCITON  --   --  <0.10  --   --  <0.10  LATICACIDVEN 3.14* 1.38  --  1.7 2.5*  --     Recent Results (from the past 240 hour(s))  Blood Culture (routine x 2)     Status: None (Preliminary result)   Collection Time: 10/15/16  4:35 PM  Result Value Ref Range Status   Specimen Description BLOOD LEFT HAND  Final   Special Requests   Final    Blood Culture results may not be optimal due to an inadequate volume of blood received in culture bottles   Culture NO GROWTH 2 DAYS  Final   Report Status PENDING  Incomplete  Blood Culture (routine x 2)     Status: None (Preliminary result)   Collection Time: 10/15/16  4:45 PM  Result Value Ref Range Status   Specimen Description BLOOD LEFT WRIST  Final   Special  Requests   Final    Blood Culture results may not be optimal due to an inadequate volume of blood received in culture bottles   Culture NO GROWTH 2 DAYS  Final   Report Status PENDING  Incomplete  MRSA PCR Screening  Status: None   Collection Time: 10/15/16  8:08 PM  Result Value Ref Range Status   MRSA by PCR NEGATIVE NEGATIVE Final    Comment:        The GeneXpert MRSA Assay (FDA approved for NASAL specimens only), is one component of a comprehensive MRSA colonization surveillance program. It is not intended to diagnose MRSA infection nor to guide or monitor treatment for MRSA infections.   Culture, respiratory (NON-Expectorated)     Status: None (Preliminary result)   Collection Time: 10/15/16  9:00 PM  Result Value Ref Range Status   Specimen Description SPUTUM  Final   Special Requests NONE  Final   Gram Stain   Final    MODERATE WBC PRESENT,BOTH PMN AND MONONUCLEAR NO SQUAMOUS EPITHELIAL CELLS SEEN NO ORGANISMS SEEN    Culture   Final    NO GROWTH < 24 HOURS Performed at [facility], [place] [address], [place], [place] [address]    Report Status PENDING  Incomplete         Radiology Studies: Dg Abd 1 View  Result Date: 10/16/2016 CLINICAL DATA:  Abdominal pain. EXAM: ABDOMEN - 1 VIEW COMPARISON:  10/10/2016 FINDINGS: Cholecystectomy clips are identified within the right upper quadrant. There is a nasogastric tube with tip in the distal stomach. The bowel gas pattern is nonobstructed. No dilated loops of small or large bowel. IMPRESSION: 1. Nonobstructive bowel gas pattern. Electronically Signed   By: Kerby Moors M.D.   On: 10/16/2016 15:15   Ct Head Wo Contrast  Result Date: 10/15/2016 CLINICAL DATA:  Pt c/o upper back pain after fall 3 days ago. Pt denies LOC upon fall. Pt's BP in triage 87/52. Pt falling asleep in triage, but easily arouses. Pt reports she doesn't know why she's so sleepy. Denies dizziness, weakness. EXAM: CT HEAD WITHOUT CONTRAST  TECHNIQUE: Contiguous axial images were obtained from the base of the skull through the vertex without intravenous contrast. COMPARISON:  02/17/2016, 05/01/2015 FINDINGS: Brain: There is encephalomalacia and postsurgical change involving the frontal lobes bilaterally. 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 Minimal soft tissue is identified within this same location, less apparent without intravenous contrast. There is no significant mass effect or edema in this region. No new mass or evidence for acute infarction. There is mild central and cortical atrophy. Vascular: There is dense atherosclerotic calcification of the internal carotid arteries. Skull: Status post bifrontal craniotomy. No acute calvarial abnormality. Sinuses/Orbits: No acute finding. Other: None IMPRESSION: 1. Status post bifrontal craniotomy. 2. Soft tissue density in the region of the known planum sphenoidale meningioma shows no evidence for edema or mass effect by CT. Consider MRI for more accurate comparison. 3. No evidence for acute injury. Electronically Signed   By: Nolon Nations M.D.   On: 10/15/2016 17:56   Dg Chest Port 1 View  Result Date: 10/17/2016 CLINICAL DATA:  Acute respiratory failure.  COPD EXAM: PORTABLE CHEST 1 VIEW COMPARISON:  10/16/2016 FINDINGS: Endotracheal to tube 6.5 cm from carina. Tube projects over the thoracic inlet. NG tube in stomach. Extremely low lung volumes. Bibasilar atelectasis. No pulmonary edema. IMPRESSION: 1. Endotracheal tube appears high; however, this may be positional. 2. Low lung volumes and basilar atelectasis not changed. Electronically Signed   By: Suzy Bouchard M.D.   On: 10/17/2016 09:04   Portable Chest 1 View  Result Date: 10/16/2016 CLINICAL DATA:  Hypoxia EXAM: PORTABLE CHEST 1 VIEW  COMPARISON:  October 15, 2016 FINDINGS: Endotracheal tube tip is 5.1 cm above the carina. Nasogastric tube tip and  side port are below the diaphragm. No pneumothorax. There is consolidation in the right lung base. Lungs elsewhere clear. Heart is borderline enlarged with pulmonary vascularity within normal limits. No adenopathy. No bone lesions. IMPRESSION: Airspace consolidation right base. Lungs elsewhere clear. Stable cardiac prominence. Tube positions as described without pneumothorax. Electronically Signed   By: Lowella Grip III M.D.   On: 10/16/2016 07:20   Dg Chest Portable 1 View  Result Date: 10/15/2016 CLINICAL DATA:  Code sepsis.  Life support line placement. EXAM: PORTABLE CHEST 1 VIEW COMPARISON:  Chest radiograph September 10, 2016 FINDINGS: Endotracheal tube tip projects 2.2 cm above the carina. Nasogastric tube past GE junction, distal tip not included. Patient rotated to the RIGHT accentuating vascular markings. RIGHT lung base strandy densities. Low inspiratory examination without pleural effusion or focal consolidation. Stable RIGHT midlung zone scarring. No pneumothorax. Soft tissue planes and included osseous structures are unchanged. IMPRESSION: Endotracheal tube tip projects 2.2 cm above the carina. Nasogastric tube past GE junction, distal tip not included. Low inspiratory examination of the RIGHT mid and lower lung zone atelectasis/ scarring. Electronically Signed   By: Elon Alas M.D.   On: 10/15/2016 16:53        Scheduled Meds: . amLODipine  10 mg Per Tube Daily  . budesonide (PULMICORT) nebulizer solution  0.5 mg Nebulization BID  . chlorhexidine gluconate (MEDLINE KIT)  15 mL Mouth Rinse BID  . cloNIDine  0.3 mg Per Tube TID  . enoxaparin (LOVENOX) injection  40 mg Subcutaneous Q24H  . insulin aspart  0-9 Units Subcutaneous Q4H  . ipratropium-albuterol  3 mL Nebulization Q4H  . lactulose  30 g Oral BID  . lamoTRIgine  100 mg Per Tube BID  . levothyroxine  25 mcg Intravenous QAC breakfast  . mouth rinse  15 mL Mouth Rinse QID  . methylPREDNISolone (SOLU-MEDROL) injection   80 mg Intravenous Q12H  . metoprolol tartrate  5 mg Intravenous Q8H  . pantoprazole (PROTONIX) IV  40 mg Intravenous Q12H  . sertraline  200 mg Per Tube Daily   Continuous Infusions: . sodium chloride 1,000 mL (10/17/16 0933)  . aztreonam Stopped (10/17/16 1003)  . fentaNYL infusion INTRAVENOUS 50 mcg/hr (10/17/16 0935)  . levofloxacin (LEVAQUIN) IV Stopped (10/16/16 1955)     LOS: 2 days    Time spent: 76 mins    Kristel Durkee, MD [facility] Pager [phone]  If 7PM-7AM, please contact night-coverage [email or web address] Password [facility] 10/17/2016, 9:36 AM

## 2016-10-17 NOTE — Progress Notes (Signed)
Patient has grown more and more agitated frustrated and restless through the shift. Family present at bedside for a couple hours, in which she was writing notes to them on paper stating " I dont know these people" they are trying to hurt me" and other things which eventually became illegible. Family and patient assured we are not trying to hurt her and are in fact doing all we can to make and keep her comfortable and pain free. Explained to family and patient about the weaning process and hopes of a successful extubation tomorrow. Family agrees and acknowledges understanding. Patient however has continued to be agitated and restless even after maxing out her fentanyl drip.

## 2016-10-17 NOTE — Progress Notes (Signed)
EEG Completed; Results Pending  

## 2016-10-18 ENCOUNTER — Ambulatory Visit: Payer: Self-pay | Admitting: Family Medicine

## 2016-10-18 DIAGNOSIS — I272 Pulmonary hypertension, unspecified: Secondary | ICD-10-CM

## 2016-10-18 DIAGNOSIS — G9341 Metabolic encephalopathy: Secondary | ICD-10-CM

## 2016-10-18 DIAGNOSIS — R0989 Other specified symptoms and signs involving the circulatory and respiratory systems: Secondary | ICD-10-CM

## 2016-10-18 DIAGNOSIS — N179 Acute kidney failure, unspecified: Secondary | ICD-10-CM

## 2016-10-18 DIAGNOSIS — J189 Pneumonia, unspecified organism: Secondary | ICD-10-CM

## 2016-10-18 DIAGNOSIS — G40909 Epilepsy, unspecified, not intractable, without status epilepticus: Secondary | ICD-10-CM

## 2016-10-18 DIAGNOSIS — J9602 Acute respiratory failure with hypercapnia: Secondary | ICD-10-CM

## 2016-10-18 DIAGNOSIS — D509 Iron deficiency anemia, unspecified: Secondary | ICD-10-CM

## 2016-10-18 DIAGNOSIS — J449 Chronic obstructive pulmonary disease, unspecified: Secondary | ICD-10-CM

## 2016-10-18 LAB — GLUCOSE, CAPILLARY
GLUCOSE-CAPILLARY: 153 mg/dL — AB (ref 65–99)
Glucose-Capillary: 101 mg/dL — ABNORMAL HIGH (ref 65–99)
Glucose-Capillary: 147 mg/dL — ABNORMAL HIGH (ref 65–99)
Glucose-Capillary: 170 mg/dL — ABNORMAL HIGH (ref 65–99)
Glucose-Capillary: 183 mg/dL — ABNORMAL HIGH (ref 65–99)
Glucose-Capillary: 215 mg/dL — ABNORMAL HIGH (ref 65–99)

## 2016-10-18 LAB — BASIC METABOLIC PANEL
Anion gap: 11 (ref 5–15)
BUN: 11 mg/dL (ref 6–20)
CALCIUM: 8 mg/dL — AB (ref 8.9–10.3)
CO2: 21 mmol/L — AB (ref 22–32)
CREATININE: 0.63 mg/dL (ref 0.44–1.00)
Chloride: 106 mmol/L (ref 101–111)
GFR calc non Af Amer: >60 mL/min (ref >60)
GLUCOSE: 238 mg/dL — AB (ref 65–99)
Potassium: 3.8 mmol/L (ref 3.5–5.1)
Sodium: 138 mmol/L (ref 135–145)

## 2016-10-18 LAB — BLOOD GAS, ARTERIAL
ACID-BASE DEFICIT: 0.8 mmol/L (ref 0.0–2.0)
BICARBONATE: 23.4 mmol/L (ref 20.0–28.0)
Drawn by: 270161
FIO2: 21
O2 Saturation: 86.8 %
PCO2 ART: 46.9 mmHg (ref 32.0–48.0)
PH ART: 7.33 — AB (ref 7.350–7.450)
Patient temperature: 36.7
pO2, Arterial: 53.4 mmHg — ABNORMAL LOW (ref 83.0–108.0)

## 2016-10-18 LAB — CULTURE, RESPIRATORY W GRAM STAIN: Culture: NO GROWTH

## 2016-10-18 LAB — CULTURE, RESPIRATORY

## 2016-10-18 LAB — CBC
HEMATOCRIT: 29.9 % — AB (ref 36.0–46.0)
Hemoglobin: 9 g/dL — ABNORMAL LOW (ref 12.0–15.0)
MCH: 23 pg — ABNORMAL LOW (ref 26.0–34.0)
MCHC: 30.1 g/dL (ref 30.0–36.0)
MCV: 76.3 fL — AB (ref 78.0–100.0)
PLATELETS: 213 10*3/uL (ref 150–400)
RBC: 3.92 MIL/uL (ref 3.87–5.11)
RDW: 14.9 % (ref 11.5–15.5)
WBC: 7.6 10*3/uL (ref 4.0–10.5)

## 2016-10-18 LAB — HIV ANTIBODY (ROUTINE TESTING W REFLEX): HIV Screen 4th Generation wRfx: NONREACTIVE

## 2016-10-18 LAB — RPR: RPR Ser Ql: NONREACTIVE

## 2016-10-18 MED ORDER — AMLODIPINE BESYLATE 5 MG PO TABS
5.0000 mg | ORAL_TABLET | Freq: Every day | ORAL | Status: DC
  Administered 2016-10-18 – 2016-10-20 (×3): 5 mg via ORAL
  Filled 2016-10-18 (×3): qty 1

## 2016-10-18 MED ORDER — RISPERIDONE 0.5 MG PO TABS
0.5000 mg | ORAL_TABLET | Freq: Every day | ORAL | Status: DC
  Administered 2016-10-18 – 2016-10-19 (×2): 0.5 mg via ORAL
  Filled 2016-10-18 (×2): qty 1

## 2016-10-18 MED ORDER — SERTRALINE HCL 50 MG PO TABS
50.0000 mg | ORAL_TABLET | Freq: Two times a day (BID) | ORAL | Status: DC
  Administered 2016-10-18 – 2016-10-20 (×5): 50 mg via ORAL
  Filled 2016-10-18 (×5): qty 1

## 2016-10-18 MED ORDER — METHYLPREDNISOLONE SODIUM SUCC 125 MG IJ SOLR
60.0000 mg | Freq: Two times a day (BID) | INTRAMUSCULAR | Status: DC
  Administered 2016-10-18 (×2): 60 mg via INTRAVENOUS
  Filled 2016-10-18 (×2): qty 2

## 2016-10-18 MED ORDER — INSULIN GLARGINE 100 UNIT/ML ~~LOC~~ SOLN
12.0000 [IU] | Freq: Every day | SUBCUTANEOUS | Status: DC
  Administered 2016-10-18 – 2016-10-19 (×2): 12 [IU] via SUBCUTANEOUS
  Filled 2016-10-18 (×5): qty 0.12

## 2016-10-18 MED ORDER — LAMOTRIGINE 100 MG PO TABS
100.0000 mg | ORAL_TABLET | Freq: Two times a day (BID) | ORAL | Status: DC
  Administered 2016-10-18 – 2016-10-20 (×5): 100 mg via ORAL
  Filled 2016-10-18 (×7): qty 1

## 2016-10-18 NOTE — Progress Notes (Signed)
Subjective: She extubated herself about an hour ago. She has been receiving Ativan through the night so she is still sleepy but I'm also concerned because she had an elevated PCO2 yesterday while she was on the ventilator. Arterial blood gas has been ordered  Objective: Vital signs in last 24 hours: Temp:  [95.7 F (35.4 C)-98.6 F (37 C)] 98.6 F (37 C) (08/08 0700) Pulse Rate:  [70-100] 71 (08/08 0700) Resp:  [12-27] 13 (08/08 0700) BP: (128-186)/(57-121) 148/65 (08/08 0700) SpO2:  [94 %-100 %] 98 % (08/08 0700) FiO2 (%):  [35 %-40 %] 40 % (08/08 0401) Weight:  [72.2 kg (159 lb 2.8 oz)] 72.2 kg (159 lb 2.8 oz) (08/08 0500) Weight change: -0.5 kg (-1 lb 1.6 oz) Last BM Date:  (unknown)  Intake/Output from previous day: 08/07 0701 - 08/08 0700 In: 2091.5 [I.V.:1691.5; NG/GT:100; IV Piggyback:300] Out: 1525 [Urine:875; Emesis/NG output:650]  PHYSICAL EXAM General appearance: She is sleepy but does wake up and attempt to communicate with me. Resp: rhonchi bilaterally Cardio: regular rate and rhythm, S1, S2 normal, no murmur, click, rub or gallop GI: soft, non-tender; bowel sounds normal; no masses,  no organomegaly Extremities: extremities normal, atraumatic, no cyanosis or edema Skin warm and dry  Lab Results:  Results for orders placed or performed during the hospital encounter of 10/15/16 (from the past 48 hour(s))  Glucose, capillary     Status: Abnormal   Collection Time: 10/16/16  7:49 AM  Result Value Ref Range   Glucose-Capillary 122 (H) 65 - 99 mg/dL  Troponin I     Status: None   Collection Time: 10/16/16  9:32 AM  Result Value Ref Range   Troponin I <0.03 <0.03 ng/mL  Ammonia     Status: Abnormal   Collection Time: 10/16/16  9:32 AM  Result Value Ref Range   Ammonia 40 (H) 9 - 35 umol/L  Glucose, capillary     Status: Abnormal   Collection Time: 10/16/16 11:35 AM  Result Value Ref Range   Glucose-Capillary 129 (H) 65 - 99 mg/dL  Blood gas, arterial      Status: Abnormal   Collection Time: 10/16/16  1:18 PM  Result Value Ref Range   FIO2 40.00    Delivery systems VENTILATOR    Mode PRESSURE SUPPORT    Peep/cpap 5.0 cm H20   Pressure support 10 cm H20   pH, Arterial 7.278 (L) 7.350 - 7.450   pCO2 arterial 45.3 32.0 - 48.0 mmHg   pO2, Arterial 112 (H) 83.0 - 108.0 mmHg   Bicarbonate 19.8 (L) 20.0 - 28.0 mmol/L   Acid-base deficit 5.2 (H) 0.0 - 2.0 mmol/L   O2 Saturation 97.5 %   Patient temperature 37.1    Collection site LEFT RADIAL    Drawn by [id number]    Sample type ARTERIAL DRAW    Allens test (pass/fail) PASS PASS  Glucose, capillary     Status: Abnormal   Collection Time: 10/16/16  4:31 PM  Result Value Ref Range   Glucose-Capillary 159 (H) 65 - 99 mg/dL  Glucose, capillary     Status: Abnormal   Collection Time: 10/16/16  7:56 PM  Result Value Ref Range   Glucose-Capillary 140 (H) 65 - 99 mg/dL  Glucose, capillary     Status: Abnormal   Collection Time: 10/17/16 12:05 AM  Result Value Ref Range   Glucose-Capillary 130 (H) 65 - 99 mg/dL  Glucose, capillary     Status: Abnormal   Collection Time: 10/17/16  4:12 AM  Result Value Ref Range   Glucose-Capillary 191 (H) 65 - 99 mg/dL  Procalcitonin     Status: None   Collection Time: 10/17/16  5:58 AM  Result Value Ref Range   Procalcitonin <0.10 ng/mL    Comment:        Interpretation: PCT (Procalcitonin) <= 0.5 ng/mL: Systemic infection (sepsis) is not likely. Local bacterial infection is possible. (NOTE)         ICU PCT Algorithm               Non ICU PCT Algorithm    ----------------------------     ------------------------------         PCT < 0.25 ng/mL                 PCT < 0.1 ng/mL     Stopping of antibiotics            Stopping of antibiotics       strongly encouraged.               strongly encouraged.    ----------------------------     ------------------------------       PCT level decrease by               PCT < 0.25 ng/mL       >= 80% from peak PCT        OR PCT 0.25 - 0.5 ng/mL          Stopping of antibiotics                                             encouraged.     Stopping of antibiotics           encouraged.    ----------------------------     ------------------------------       PCT level decrease by              PCT >= 0.25 ng/mL       < 80% from peak PCT        AND PCT >= 0.5 ng/mL            Continuin g antibiotics                                              encouraged.       Continuing antibiotics            encouraged.    ----------------------------     ------------------------------     PCT level increase compared          PCT > 0.5 ng/mL         with peak PCT AND          PCT >= 0.5 ng/mL             Escalation of antibiotics                                          strongly encouraged.      Escalation of antibiotics        strongly encouraged.   CBC with Differential/Platelet  Status: Abnormal   Collection Time: 10/17/16  5:58 AM  Result Value Ref Range   WBC 9.6 4.0 - 10.5 K/uL   RBC 4.04 3.87 - 5.11 MIL/uL   Hemoglobin 9.2 (L) 12.0 - 15.0 g/dL   HCT 30.3 (L) 36.0 - 46.0 %   MCV 75.0 (L) 78.0 - 100.0 fL   MCH 22.8 (L) 26.0 - 34.0 pg   MCHC 30.4 30.0 - 36.0 g/dL   RDW 14.3 11.5 - 15.5 %   Platelets 219 150 - 400 K/uL   Neutrophils Relative % 90 %   Neutro Abs 8.7 (H) 1.7 - 7.7 K/uL   Lymphocytes Relative 6 %   Lymphs Abs 0.5 (L) 0.7 - 4.0 K/uL   Monocytes Relative 4 %   Monocytes Absolute 0.4 0.1 - 1.0 K/uL   Eosinophils Relative 0 %   Eosinophils Absolute 0.0 0.0 - 0.7 K/uL   Basophils Relative 0 %   Basophils Absolute 0.0 0.0 - 0.1 K/uL  Comprehensive metabolic panel     Status: Abnormal   Collection Time: 10/17/16  5:58 AM  Result Value Ref Range   Sodium 138 135 - 145 mmol/L   Potassium 3.8 3.5 - 5.1 mmol/L   Chloride 109 101 - 111 mmol/L   CO2 22 22 - 32 mmol/L   Glucose, Bld 198 (H) 65 - 99 mg/dL   BUN 10 6 - 20 mg/dL   Creatinine, Ser 0.65 0.44 - 1.00 mg/dL   Calcium 8.0 (L) 8.9 - 10.3  mg/dL   Total Protein 6.5 6.5 - 8.1 g/dL   Albumin 3.2 (L) 3.5 - 5.0 g/dL   AST 24 15 - 41 U/L   ALT 18 14 - 54 U/L   Alkaline Phosphatase 77 38 - 126 U/L   Total Bilirubin 0.6 0.3 - 1.2 mg/dL   GFR calc non Af Amer >60 >60 mL/min   GFR calc Af Amer >60 >60 mL/min    Comment: (NOTE) The eGFR has been calculated using the CKD EPI equation. This calculation has not been validated in all clinical situations. eGFR's persistently <60 mL/min signify possible Chronic Kidney Disease.    Anion gap 7 5 - 15  Glucose, capillary     Status: Abnormal   Collection Time: 10/17/16  7:59 AM  Result Value Ref Range   Glucose-Capillary 169 (H) 65 - 99 mg/dL  Blood gas, arterial     Status: Abnormal   Collection Time: 10/17/16  9:25 AM  Result Value Ref Range   FIO2 35.00    Delivery systems VENTILATOR    Mode PRESSURE REGULATED VOLUME CONTROL    VT 400 mL   LHR 16 resp/min   Peep/cpap 5.0 cm H20   pH, Arterial 7.267 (L) 7.350 - 7.450   pCO2 arterial 51.0 (H) 32.0 - 48.0 mmHg   pO2, Arterial 71.5 (L) 83.0 - 108.0 mmHg   Bicarbonate 20.9 20.0 - 28.0 mmol/L   Acid-base deficit 3.5 (H) 0.0 - 2.0 mmol/L   O2 Saturation 92.1 %   Patient temperature 37.0    Collection site RIGHT RADIAL    Drawn by [id number]    Sample type ARTERIAL DRAW    Allens test (pass/fail) PASS PASS  RPR     Status: None   Collection Time: 10/17/16 10:08 AM  Result Value Ref Range   RPR Ser Ql Non Reactive Non Reactive    Comment: (NOTE) Performed At: [facility] [address] [place], [place] [id number] Lindon Romp MD [id number]  HIV antibody (routine testing) (NOT for [facility])     Status: None   Collection Time: 10/17/16 10:08 AM  Result Value Ref Range   HIV Screen 4th Generation wRfx Non Reactive Non Reactive    Comment: (NOTE) Performed At: [facility] [place], [place] [id number] Lindon Romp MD WU:[id number]   Blood gas, arterial     Status: Abnormal   Collection  Time: 10/17/16 11:30 AM  Result Value Ref Range   FIO2 40.00    Delivery systems VENTILATOR    Mode CONTINUOUS POSITIVE AIRWAY PRESSURE    Peep/cpap 5.0 cm H20   Pressure support 5 cm H20   pH, Arterial 7.211 (L) 7.350 - 7.450   pCO2 arterial 60.1 (H) 32.0 - 48.0 mmHg    Comment: CRITICAL RESULT CALLED TO, READ BACK BY AND VERIFIED WITH: MOTLEY,J.RN AT 1140 10/17/16 BY BROADNAX,L.RRT    pO2, Arterial 85.0 83.0 - 108.0 mmHg   Bicarbonate 20.6 20.0 - 28.0 mmol/L   Acid-base deficit 3.6 (H) 0.0 - 2.0 mmol/L   O2 Saturation 94.5 %   Patient temperature 37.0    Collection site RIGHT RADIAL    Drawn by [phone]    Sample type ARTERIAL DRAW    Allens test (pass/fail) PASS PASS  Glucose, capillary     Status: Abnormal   Collection Time: 10/17/16 12:08 PM  Result Value Ref Range   Glucose-Capillary 181 (H) 65 - 99 mg/dL  Glucose, capillary     Status: Abnormal   Collection Time: 10/17/16  4:08 PM  Result Value Ref Range   Glucose-Capillary 181 (H) 65 - 99 mg/dL  Glucose, capillary     Status: Abnormal   Collection Time: 10/17/16  7:49 PM  Result Value Ref Range   Glucose-Capillary 203 (H) 65 - 99 mg/dL  Glucose, capillary     Status: Abnormal   Collection Time: 10/17/16 11:07 PM  Result Value Ref Range   Glucose-Capillary 117 (H) 65 - 99 mg/dL  CBC     Status: Abnormal   Collection Time: 10/18/16  4:58 AM  Result Value Ref Range   WBC 7.6 4.0 - 10.5 K/uL   RBC 3.92 3.87 - 5.11 MIL/uL   Hemoglobin 9.0 (L) 12.0 - 15.0 g/dL   HCT 29.9 (L) 36.0 - 46.0 %   MCV 76.3 (L) 78.0 - 100.0 fL   MCH 23.0 (L) 26.0 - 34.0 pg   MCHC 30.1 30.0 - 36.0 g/dL   RDW 14.9 11.5 - 15.5 %   Platelets 213 150 - 400 K/uL  Basic metabolic panel     Status: Abnormal   Collection Time: 10/18/16  4:58 AM  Result Value Ref Range   Sodium 138 135 - 145 mmol/L   Potassium 3.8 3.5 - 5.1 mmol/L   Chloride 106 101 - 111 mmol/L   CO2 21 (L) 22 - 32 mmol/L   Glucose, Bld 238 (H) 65 - 99 mg/dL   BUN 11 6 - 20  mg/dL   Creatinine, Ser 0.63 0.44 - 1.00 mg/dL   Calcium 8.0 (L) 8.9 - 10.3 mg/dL   GFR calc non Af Amer >60 >60 mL/min   GFR calc Af Amer >60 >60 mL/min    Comment: (NOTE) The eGFR has been calculated using the CKD EPI equation. This calculation has not been validated in all clinical situations. eGFR's persistently <60 mL/min signify possible Chronic Kidney Disease.    Anion gap 11 5 - 15  Glucose, capillary  Status: Abnormal   Collection Time: 10/18/16  5:41 AM  Result Value Ref Range   Glucose-Capillary 215 (H) 65 - 99 mg/dL  Glucose, capillary     Status: Abnormal   Collection Time: 10/18/16  7:22 AM  Result Value Ref Range   Glucose-Capillary 170 (H) 65 - 99 mg/dL   *Note: Due to a large number of results and/or encounters for the requested time period, some results have not been displayed. A complete set of results can be found in Results Review.    ABGS  Recent Labs  10/15/16 1605  10/17/16 1130  PHART  --   < > 7.211*  PO2ART  --   < > 85.0  TCO2 25  --   --   HCO3  --   < > 20.6  < > = values in this interval not displayed. CULTURES Recent Results (from the past 240 hour(s))  Blood Culture (routine x 2)     Status: None (Preliminary result)   Collection Time: 10/15/16  4:35 PM  Result Value Ref Range Status   Specimen Description BLOOD LEFT HAND  Final   Special Requests   Final    Blood Culture results may not be optimal due to an inadequate volume of blood received in culture bottles   Culture NO GROWTH 2 DAYS  Final   Report Status PENDING  Incomplete  Blood Culture (routine x 2)     Status: None (Preliminary result)   Collection Time: 10/15/16  4:45 PM  Result Value Ref Range Status   Specimen Description BLOOD LEFT WRIST  Final   Special Requests   Final    Blood Culture results may not be optimal due to an inadequate volume of blood received in culture bottles   Culture NO GROWTH 2 DAYS  Final   Report Status PENDING  Incomplete  Urine Culture      Status: None   Collection Time: 10/15/16  5:00 PM  Result Value Ref Range Status   Specimen Description URINE, CATHETERIZED  Final   Special Requests NONE  Final   Culture   Final    NO GROWTH Performed at [facility], [address], [place], [place] [address]    Report Status 10/17/2016 FINAL  Final  MRSA PCR Screening     Status: None   Collection Time: 10/15/16  8:08 PM  Result Value Ref Range Status   MRSA by PCR NEGATIVE NEGATIVE Final    Comment:        The GeneXpert MRSA Assay (FDA approved for NASAL specimens only), is one component of a comprehensive MRSA colonization surveillance program. It is not intended to diagnose MRSA infection nor to guide or monitor treatment for MRSA infections.   Culture, respiratory (NON-Expectorated)     Status: None (Preliminary result)   Collection Time: 10/15/16  9:00 PM  Result Value Ref Range Status   Specimen Description SPUTUM  Final   Special Requests NONE  Final   Gram Stain   Final    MODERATE WBC PRESENT,BOTH PMN AND MONONUCLEAR NO SQUAMOUS EPITHELIAL CELLS SEEN NO ORGANISMS SEEN    Culture   Final    NO GROWTH < 24 HOURS Performed at [facility], [place] [address] [place], [place] [address]    Report Status PENDING  Incomplete   Studies/Results: Dg Abd 1 View  Result Date: 10/16/2016 CLINICAL DATA:  Abdominal pain. EXAM: ABDOMEN - 1 VIEW COMPARISON:  10/10/2016 FINDINGS: Cholecystectomy clips are identified  within the right upper quadrant. There is a nasogastric tube with tip in the distal stomach. The bowel gas pattern is nonobstructed. No dilated loops of small or large bowel. IMPRESSION: 1. Nonobstructive bowel gas pattern. Electronically Signed   By: Kerby Moors M.D.   On: 10/16/2016 15:15   Dg Chest Port 1 View  Result Date: 10/17/2016 CLINICAL DATA:  Acute respiratory failure.  COPD EXAM: PORTABLE CHEST 1 VIEW COMPARISON:  10/16/2016 FINDINGS: Endotracheal to tube 6.5 cm from carina. Tube projects  over the thoracic inlet. NG tube in stomach. Extremely low lung volumes. Bibasilar atelectasis. No pulmonary edema. IMPRESSION: 1. Endotracheal tube appears high; however, this may be positional. 2. Low lung volumes and basilar atelectasis not changed. Electronically Signed   By: Suzy Bouchard M.D.   On: 10/17/2016 09:04    Medications:  Prior to Admission:  Prescriptions Prior to Admission  Medication Sig Dispense Refill Last Dose  . amLODipine (NORVASC) 5 MG tablet TAKE 2 TABLETS BY MOUTH ONCE DAILY. 60 tablet 0 unknown at Unknown time  . diclofenac sodium (VOLTAREN) 1 % GEL Apply 2 g topically daily as needed (Pain). 100 g 2 unknown at Unknown time  . dicyclomine (BENTYL) 10 MG capsule Take 10 mg by mouth 4 (four) times daily -  before meals and at bedtime.   unknown at Unknown time  . hydroxychloroquine (PLAQUENIL) 200 MG tablet Take 200 mg by mouth 2 (two) times daily.    unknown at Unknown time  . lamoTRIgine (LAMICTAL) 100 MG tablet Take 1 tablet (100 mg total) by mouth 2 (two) times daily. 60 tablet 5 unknown at Unknown time  . LORazepam (ATIVAN) 0.5 MG tablet Take 1 tablet (0.5 mg total) by mouth 3 (three) times daily. 90 tablet 2 unknown at Unknown time  . losartan (COZAAR) 50 MG tablet TAKE ONE TABLET BY MOUTH DAILY. 90 tablet 1 unknown at Unknown time  . ondansetron (ZOFRAN ODT) 8 MG disintegrating tablet Take 1 tablet (8 mg total) by mouth every 8 (eight) hours as needed for nausea or vomiting. 10 tablet 0 unknown at Unknown time  . pregabalin (LYRICA) 75 MG capsule Take 75 mg by mouth 2 (two) times daily.    unknown at Unknown time  . RABEprazole (ACIPHEX) 20 MG tablet Take 1 tablet (20 mg total) by mouth 2 (two) times daily. 180 tablet 1 unknown at Unknown time  . RESTASIS 0.05 % ophthalmic emulsion Place 1 drop into both eyes 2 (two) times daily.    unknown at Unknown time  . risperiDONE (RISPERDAL) 0.5 MG tablet Take 1 tablet (0.5 mg total) by mouth at bedtime. 90 tablet 2  unknown at Unknown time  . sertraline (ZOLOFT) 100 MG tablet Take 2 tablets (200 mg total) by mouth daily. 180 tablet 2 unknown at Unknown time  . traZODone (DESYREL) 100 MG tablet Take 2 tablets (200 mg total) by mouth at bedtime. 180 tablet 2 unknown at Unknown time  . ACCU-CHEK FASTCLIX LANCETS MISC Use to test blood sugar 4 times daily. Dx: E10.65 408 each 1 Taking  . ACCU-CHEK SMARTVIEW test strip TEST BLOOD SUGAR FOUR TIMES DAILY AS DIRECTED 350 each 2 Taking  . albuterol (PROVENTIL HFA;VENTOLIN HFA) 108 (90 Base) MCG/ACT inhaler Inhale 1-2 puffs every 6 hours as needed for wheezing, shortness of breath 3 Inhaler 3 Taking  . Alcohol Swabs (B-D SINGLE USE SWABS REGULAR) PADS Use for injections and glucose testing 8 times daily. Dx: E10.65 900 each 1 Taking  . aspirin  EC 81 MG tablet Take 81 mg by mouth daily.   Taking  . Azelastine-Fluticasone (DYMISTA) 137-50 MCG/ACT SUSP Place 1 puff into the nose at bedtime. 1 Bottle 0 Taking  . BD PEN NEEDLE NANO U/F 32G X 4 MM MISC USE FOUR TIMES DAILY 360 each 3 Taking  . Blood Glucose Calibration (ACCU-CHEK SMARTVIEW CONTROL) LIQD 1 each by Other route as needed. 1 each 2 Taking  . Blood Glucose Monitoring Suppl (ACCU-CHEK NANO SMARTVIEW) W/DEVICE KIT 1 each by Does not apply route daily. Dx: E10.65 (Patient not taking: Reported on 09/27/2016) 1 kit 0 Not Taking  . Cholecalciferol (VITAMIN D) 2000 units CAPS Take 1 capsule by mouth daily.   Taking  . clobetasol cream (TEMOVATE) 6.06 % Apply 1 application topically daily.   Taking  . cloNIDine (CATAPRES) 0.3 MG tablet TAKE 1 TABLET BY MOUTH EVERY EIGHT HOURS. ONCE AT 8AM, 4PM, AND 12 MIDNIGHT. 270 tablet 0 Taking  . Coenzyme Q-10 100 MG capsule Take 1 capsule (100 mg total) by mouth 3 (three) times daily. 90 capsule 5 Taking  . Insulin Glargine (TOUJEO SOLOSTAR) 300 UNIT/ML SOPN Inject 25 Units into the skin at bedtime. 6 pen 3 Taking  . levothyroxine (SYNTHROID, LEVOTHROID) 50 MCG tablet TAKE 1 TABLET BY  MOUTH ONCE DAILY AND 1/2 TABLET ON SUNDAYS. 30 tablet 0 Taking  . metoprolol tartrate (LOPRESSOR) 50 MG tablet Take 1 tablet (50 mg total) by mouth 2 (two) times daily. Please call and schedule appointment 60 tablet 0 Taking  . montelukast (SINGULAIR) 10 MG tablet TAKE ONE TABLET BY MOUTH ONCE DAILY. 90 tablet 0 Taking  . Multiple Vitamins-Minerals (ONE-A-DAY 50 PLUS PO) Take 1 tablet by mouth daily.   Taking  . NOVOLOG FLEXPEN 100 UNIT/ML FlexPen INJECT 16 TO 30 UNITS INTO THE SKIN 3 (THREE) TIMES DAILY WITH MEALS. 90 mL 2 Taking  . nystatin (MYCOSTATIN/NYSTOP) powder APPLY TO AFFECTED AREA FOUR TIMES DAILY. 30 g 2 Taking  . Riboflavin 400 MG TABS Take 400 mg by mouth daily. (Patient not taking: Reported on 09/27/2016) 30 tablet 5 Not Taking  . rosuvastatin (CRESTOR) 5 MG tablet TAKE 1 TABLET BY MOUTH AT BEDTIME. 90 tablet 0 Taking  . vitamin B-12 (CYANOCOBALAMIN) 1000 MCG tablet Take 1,000 mcg by mouth daily.   Taking   Scheduled: . amLODipine  10 mg Per Tube Daily  . arformoterol  15 mcg Nebulization BID  . budesonide (PULMICORT) nebulizer solution  0.5 mg Nebulization BID  . chlorhexidine gluconate (MEDLINE KIT)  15 mL Mouth Rinse BID  . cloNIDine  0.3 mg Per Tube TID  . enoxaparin (LOVENOX) injection  40 mg Subcutaneous Q24H  . insulin aspart  0-9 Units Subcutaneous Q4H  . ipratropium-albuterol  3 mL Nebulization Q6H  . lactulose  30 g Oral BID  . lamoTRIgine  100 mg Per Tube BID  . levothyroxine  25 mcg Intravenous QAC breakfast  . mouth rinse  15 mL Mouth Rinse QID  . methylPREDNISolone (SOLU-MEDROL) injection  80 mg Intravenous Q12H  . metoprolol tartrate  5 mg Intravenous Q8H  . pantoprazole (PROTONIX) IV  40 mg Intravenous Q12H  . sertraline  200 mg Per Tube Daily   Continuous: . sodium chloride 1,000 mL (10/17/16 2159)  . aztreonam Stopped (10/17/16 2340)  . fentaNYL infusion INTRAVENOUS Stopped (10/18/16 0620)  . levofloxacin (LEVAQUIN) IV Stopped (10/17/16 1944)    TKZ:[id number], LORazepam, ondansetron **OR** ondansetron (ZOFRAN) IV  Assesment: She was admitted with acute hypercapnic and hypoxic respiratory failure.  She has pneumonia likely aspiration which is being treated. She has metabolic encephalopathy likely related to her hypoxia and hypercapnia but she also has seizure disorder. Little history is available for not sure if he had a seizure prior to this episode. At baseline she has COPD. She has acute kidney injury. She has obstructive sleep apnea. She has now self extubated and is sleepy so concern is that she may have elevated PCO2. If she does show elevated PCO2 with metabolic acidemia I would try BiPAP first but she may end up having to be reintubated Principal Problem:   Acute respiratory failure with hypercapnia ([place]) Active Problems:   Hyperlipidemia   GERD   Obstructive sleep apnea   Constipation   Polypharmacy   Bipolar disorder ([place])   Hemiplegia and hemiparesis following cerebral infarction affecting unspecified side ([place])   Seizure disorder (HCC)   Gastroparesis   Hypothyroidism   COPD (chronic obstructive pulmonary disease) with chronic bronchitis (HCC)   Microcytic anemia   Pulmonary hypertension (HCC)   Poorly controlled type 2 diabetes mellitus with circulatory disorder (HCC)   Labile hypertension   Class 1 obesity with serious comorbidity and body mass index (BMI) of 33.0 to 33.9 in adult   Diabetic polyneuropathy associated with type 2 diabetes mellitus ([place])   Acute metabolic encephalopathy   AKI (acute kidney injury) ([place])   HCAP (healthcare-associated pneumonia)/Aspiration PNA    Plan: As above    LOS: 3 days   Vy Badley L 10/18/2016, 7:32 AM

## 2016-10-18 NOTE — Evaluation (Signed)
Clinical/Bedside Swallow Evaluation Patient Details  Name: [name] MRN: [id number] Date of Birth: [birth year]  Today's Date: 10/18/2016 Time: SLP Start Time (ACUTE ONLY): 6734 SLP Stop Time (ACUTE ONLY): 1020 SLP Time Calculation (min) (ACUTE ONLY): 22 min  Past Medical History:  Past Medical History:  Diagnosis Date  . Anemia   . Anxiety    takes Ativan daily  . Arthritis   . Bipolar disorder ([place])    takes Risperdal nightly  . Blood transfusion   . Cancer ([place])    In her gum  . Carpal tunnel syndrome of right wrist 05/23/2011  . Cervical disc disorder with radiculopathy of cervical region 10/31/2012  . Chronic back pain   . Chronic idiopathic constipation   . Colon polyps   . COPD (chronic obstructive pulmonary disease) with chronic bronchitis ([place]) 09/16/2013   Office Spirometry 10/30/2013-submaximal effort based on appearance of loop and curve. Numbers would fit with severe restriction but her physiologic capability may be better than this. FVC 0.91/44%, and 10.74/45%, FEV1/FVC 0.81, FEF 25-75% 1.43/69%    . Depression    takes Zoloft daily  . Diabetes mellitus    Type II  . Diverticulosis    TCS 9/08 by Dr. Delfin Edis for diarrhea . Bx for micro scopic colitis negative.   . Fibromyalgia   . Frequent falls   . GERD (gastroesophageal reflux disease)    takes Aciphex daily  . Glaucoma    eye drops daily  . Gum symptoms    infection on antibiotic  . Hemiplegia affecting non-dominant side, post-stroke 08/02/2011  . Hyperlipidemia    takes Crestor daily  . Hypertension    takes Amlodipine,Metoprolol,and Clonidine daily  . Hypothyroidism    takes Synthroid daily  . IBS (irritable bowel syndrome)   . Insomnia    takes Trazodone nightly  . Metabolic encephalopathy 1/93/7902  . Migraines    chronic headaches  . Mononeuritis lower limb   . Osteoporosis   . Pancreatitis 2006   due to Depakote with normal EUS   . Schatzki's ring    non critical / EGD with ED  8/2011with RMR  . Seizures ([place])    takes Lamictal daily.Last seizure 3 yrs ago  . Sleep apnea    on CPAP  . Stroke [facility])    left sided weakness  . Tubular adenoma of colon    Past Surgical History:  Past Surgical History:  Procedure Laterality Date  . ABDOMINAL HYSTERECTOMY  1978  . BACK SURGERY  July 2012  . BACTERIAL OVERGROWTH TEST N/A 05/05/2013   Procedure: BACTERIAL OVERGROWTH TEST;  Surgeon: Daneil Dolin, MD;  [facility];  Service: Endoscopy;  Laterality: N/A;  7:30  . BIOPSY THYROID  2009  . BRAIN SURGERY  11/2011   resection of meningioma  . BREAST REDUCTION SURGERY  1994  . CARDIAC CATHETERIZATION  05/10/2005   normal coronaries, normal LV systolic function and EF (Dr. Jackie Plum)  . CARPAL TUNNEL RELEASE Left 07/22/04   Dr. Aline Brochure  . CATARACT EXTRACTION Bilateral   . CHOLECYSTECTOMY  1984  . COLONOSCOPY N/A 09/25/2012   IOX:[id number] diverticulosis.  colonic polyp-removed : tubular adenoma  . CRANIOTOMY  11/23/2011   Procedure: CRANIOTOMY TUMOR EXCISION;  Surgeon: Hosie Spangle, MD;  Location: [facility];  Service: Neurosurgery;  Laterality: N/A;  Craniotomy for tumor resection  . ESOPHAGOGASTRODUODENOSCOPY  12/29/2010   [place]-Retained food in the esophagus and stomach, small hiatal hernia, status post Maloney dilation of  the esophagus  . ESOPHAGOGASTRODUODENOSCOPY N/A 09/25/2012   [id number] atonic baggy esophagus status post Maloney dilation 93 F. Hiatal hernia  . GIVENS CAPSULE STUDY N/A 01/15/2013   NORMAL.   . IR GENERIC HISTORICAL  03/17/2016   IR RADIOLOGIST EVAL & MGMT 03/17/2016 [facility]  . LESION REMOVAL N/A 05/31/2015   Procedure: REMOVAL RIGHT AND LEFT LESIONS OF MANDIBLE;  Surgeon: Diona Browner, DDS;  Location: [facility];  Service: Oral Surgery;  Laterality: N/A;  . MALONEY DILATION  12/29/2010   RMR;  . NM MYOCAR PERF WALL MOTION  2006   "relavtiely normal" persantine, mild anterior thinning (breast attenuation artifact), no region of  scar/ischemia  . OVARIAN CYST REMOVAL    . RECTOCELE REPAIR N/A 06/29/2015   Procedure: POSTERIOR REPAIR (RECTOCELE);  Surgeon: Jonnie Kind, MD;  Location: [facility];  Service: Gynecology;  Laterality: N/A;  . REDUCTION MAMMAPLASTY Bilateral   . SPINE SURGERY  09/29/2010   Dr. Rolena Infante  . surgical excision of 3 tumors from right thigh and right buttock  and left upper thigh  2010  . TOOTH EXTRACTION Bilateral 12/14/2014   Procedure: REMOVAL OF BILATERAL MANDIBULAR EXOSTOSES;  Surgeon: Diona Browner, DDS;  Location: [facility];  Service: Oral Surgery;  Laterality: Bilateral;  . TRANSTHORACIC ECHOCARDIOGRAM  2010   EF 60-65%, mild conc LVH, grade 1 diastolic dysfunction; mildly calcified MV annulus with mildly thickened leaflets, mildly calcified MR annulus   HPI:  66 y.o. female with medical history significant of COPD, OSA, CVA with unclear hemiplegia per chart, IDDM, bipolar disorder dropped off in the ED waiting area by family  members on 10/15/16 for altered mental status.  Pt was found to be sleepy hypotensive and was intubated in the ED for airway protection.  No history can be obtained from patient and no family present.  Pt is lightly sedated at this time, she arouses to voice and her ble seem to have muscle wasting , unknown if she is chronically bedbound or what her baseline functional status is.  Her bp has normalized with 30cc/kg ivf bolus in ED per sepsis protocol.  Pt referred for admission for acute respiratory failure of unclear etiology; pt self-extubated on 10/17/16; BSE ordered on 10/18/16 to assess swallowing ability/function. CXR on 10/16/16 indicated Airspace consolidation right base. Lungs elsewhere clear; f/u CXR on 10/17/16 indicated Low lung volumes and basilar atelectasis not changed.  Assessment / Plan / Recommendation Clinical Impression   Pt presents with oropharyngeal dysphagia characterized by prolonged oral transit, oral holding, suspected delay in the initiation of the swallow with  all consistencies and vocal quality change (wet/aphonic) with tsp/cup sips of thin liquids and immediate cough after puree presentation; nectar-thickened liquids via tsp improved vocal quality, but paired with decreased LOA, pt should remain NPO at this time d/t severe aspiration risk; MBS recommended prior to intake of PO's when pt able to participate fully.  ST will determine POC after objective instrumental assessment is completed. SLP Visit Diagnosis: Dysphagia, oropharyngeal phase (R13.12)    Aspiration Risk  Moderate aspiration risk    Diet Recommendation   NPO  Medication Administration: Crushed with puree    Other  Recommendations Oral Care Recommendations: Oral care QID Other Recommendations: Other (Comment)   Follow up Recommendations Skilled Nursing facility      Frequency and Duration min 2x/week  1 week       Prognosis        Swallow Study   General Date of Onset: 10/15/16 HPI: 66  y.o. female with medical history significant of COPD, OSA, CVA with unclear hemiplegia per chart, IDDM, bipolar disorder dropped off in the ED waiting area by family  Members earlier today for altered mental status.  Pt was found to be sleepy hypotensive and was intubated in the ED for airway protection.  No history can be obtained from patient and no family present.  Pt is lightly sedated at this time, she arouses to voice and her ble seem to have muscle wasting , unknown if she is chronically bedbound or what her baseline functional status is.  Her bp has normalized with 30cc/kg ivf bolus in ED per sepsis protocol.  Pt referred for admission for acute respiratory failure of unclear etiology Type of Study: Bedside Swallow Evaluation Diet Prior to this Study: NPO Temperature Spikes Noted: No Respiratory Status: Nasal cannula History of Recent Intubation: Yes Length of Intubations (days): 3 days Date extubated: 10/17/16 Behavior/Cognition: Cooperative;Lethargic/Drowsy;Requires cueing Oral  Cavity Assessment: Other (comment) (Limited; tongue coated) Oral Care Completed by SLP: Recent completion by staff Oral Cavity - Dentition: Edentulous Self-Feeding Abilities: Needs assist;Needs set up Patient Positioning: Upright in bed Baseline Vocal Quality: Breathy;Low vocal intensity Volitional Cough: Strong Volitional Swallow: Unable to elicit    Oral/Motor/Sensory Function Overall Oral Motor/Sensory Function: Generalized oral weakness Facial ROM: Reduced left (slight) Facial Symmetry: Abnormal symmetry left (slight) Facial Strength: Reduced right;Reduced left Lingual ROM: Reduced left Lingual Symmetry: Abnormal symmetry left Lingual Strength: Reduced   Ice Chips Ice chips: Impaired Presentation: Spoon Oral Phase Impairments: Reduced lingual movement/coordination Oral Phase Functional Implications: Prolonged oral transit;Oral holding Pharyngeal Phase Impairments: Suspected delayed Swallow;Multiple swallows   Thin Liquid Thin Liquid: Impaired Presentation: Cup;Spoon Oral Phase Impairments: Reduced lingual movement/coordination Oral Phase Functional Implications: Prolonged oral transit;Oral holding Pharyngeal  Phase Impairments: Suspected delayed Swallow;Multiple swallows;Other (comments) (vocal quality change (wet/aphonic))    Nectar Thick Nectar Thick Liquid: Impaired Presentation: Spoon Oral Phase Impairments: Reduced lingual movement/coordination Oral phase functional implications: Prolonged oral transit;Oral holding Pharyngeal Phase Impairments: Suspected delayed Swallow;Multiple swallows   Honey Thick Honey Thick Liquid: Not tested   Puree Puree: Impaired Presentation: Spoon Oral Phase Impairments: Reduced lingual movement/coordination Oral Phase Functional Implications: Prolonged oral transit;Oral holding Pharyngeal Phase Impairments: Suspected delayed Swallow;Cough - Immediate   Solid      Solid: Not tested    Functional Assessment Tool Used: NOMS; clinical  judgment Functional Limitations: Swallowing Swallow Current Status ([id number]): At least 60 percent but less than 80 percent impaired, limited or restricted Swallow Goal Status [phone]): At least 20 percent but less than 40 percent impaired, limited or restricted   Elvina Sidle, M.S., CCC-SLP 10/18/2016,1:03 PM

## 2016-10-18 NOTE — Progress Notes (Signed)
At 0620 vent alarm went off and I went to check on pt and she had extubated herself. Initially placed pt on 100% NRB  And she was 100%. Moved pt to 2L [place] at 98%. VS stable. Took Fentanyl drip down and wasted 45cc with Arelia Sneddon RN.

## 2016-10-18 NOTE — Progress Notes (Signed)
PROGRESS NOTE    [name]  HWT:[id number] DOB: 04-21-1950 DOA: 10/15/2016 PCP: [name], MD    Brief Narrative:  [name]  is a 66 y.o. female with medical history significant of COPD, OSA, CVA with unclear hemiplegia per chart, IDDM, OSA on CPAP, bipolar disorder and polypharmacy. She was apparently dropped off in the ED waiting area by family members evaluation of altered mental status.  Pt was found to be sleepy and hypotensive and was intubated in the ED for airway protection.  No history could be obtained from [name] and no family was present. In the ED, she was initially hypotensive with a blood pressure of 87/52. Following IV fluid bolus, her blood pressure improved to 157/67. She was afebrile. Labs notable for lithium level less than 0.06, ammonia level 44, lactic acid of 3.14, glucose 233, creatinine 1.91, normal WBC, hemoglobin of 10.2, UA negative for nitrite and leukocytes, but with many bacteria. Noncontrasted head CT revealed status post bifrontal craniotomy with no evidence for acute injury. Chest x-ray revealed low inspiration on the right mid and lower lung zone, atelectasis/scarring. She was admitted for further evaluation and management.   Assessment & Plan:   Principal Problem:   Acute respiratory failure with hypercapnia (HCC) Active Problems:   Acute metabolic encephalopathy   Pulmonary hypertension (HCC)   HCAP (healthcare-associated pneumonia)/Aspiration PNA   Hyperlipidemia   GERD   Obstructive sleep apnea   Constipation   Polypharmacy   Bipolar disorder (HCC)   Hemiplegia and hemiparesis following cerebral infarction affecting unspecified side (HCC)   Seizure disorder (HCC)   Gastroparesis   Hypothyroidism   COPD (chronic obstructive pulmonary disease) with chronic bronchitis (HCC)   Microcytic anemia   Poorly controlled type 2 diabetes mellitus with circulatory disorder (HCC)   Labile hypertension   Class 1 obesity with serious comorbidity  and body mass index (BMI) of 33.0 to 33.9 in adult   Diabetic polyneuropathy associated with type 2 diabetes mellitus ([place])   AKI (acute kidney injury) ([place])   1. Acute encephalopathy. [name]'s head CT on admission revealed chronic stable changes. She was intubated due to airway protection initially, but was found to have an initial ABG of pH 7.24, PCO2 of 55, and PO2 of 385 following intubation. Her chest x-ray initially revealed right lower lobe atelectasis, but the follow-up chest x-ray revealed findings more consistent with pneumonia. Her ammonia level was only slightly elevated, so doubt hepatic encephalopathy. -With treatment of her pneumonia, respiratory failure, and metabolic derangements, her acute encephalopathy has resolved. -The etiology is likely multifactorial, but infection and CO2 narcosis are the most likely etiologies. Also consider polypharmacy with her psychotropic medications.  Acute respiratory failure with hypercapnia. [name] has a history of COPD and obstructive sleep apnea. -She was intubated in the ED for airway protection. Follow-up ABG revealed a pH of 7.24, PCO2 of 55, and PO2 of 385 following intubation. -Pulmonologist, Dr. Luan Pulling was consulted and assisted with ventilator management. -[name] self extubated on the morning of 10/28/16. -Follow-up ABG is pending. We'll continue oxygen supplementation and restart of CPAP nightly for OSA or BiPAP when necessary. -Likely etiology is COPD exacerbation and HCAP or ? Aspiration pneumonia.  Healthcare associated pneumonia/possible aspiration pneumonia with probable COPD exacerbation. As stated above, the [name] was intubated for airway protection and the follow-up ABG is mentioned above. -Follow-up chest x-ray on 10/16/16 was consistent with pneumonia of the right lower lobe which may be associated with aspiration. -Blood cultures ordered and have remained negative  to date. Sputum culture ordered and has been negative to  date. MRSA  by PCR was negative. -[name] was started on broad-spectrum antibiotics with aztreonam, Levaquin, and vancomycin. Vancomycin was discontinued when MRSA screen was negative. -IV Solu-Medrol and bronchodilators were started for COPD exacerbation. We'll continue to wean steroid as appropriate. ([name] is currently on Brovana neb, Pulmicort neb, and DuoNeb. -Currently stable/improvement clinically.  Acute kidney injury, secondary to prerenal azotemia. [name]'s creatinine was 1.91 on admission. Baseline creatinine from 09/2016 was within normal limits. [name] was started on IV fluids for hydration. Losartan was held. -Her creatinine has normalized.  Bipolar disorder with polypharmacy. It is unclear what the [name]'s home regimen is. Her lithium level was found to be low, but she may not be on lithium. Previous medication list noted that she was on Risperdal 0.5 mg daily at bedtime, sertraline 200 mg daily, trazodone 200 mg at bedtime and lorazepam 0.5 mg 3 times a day. -We'll decrease dose of sertraline to 50 mg twice a day. We'll restart Risperdal. Continue lorazepam when necessary, but not scheduled yet. Hold trazodone.  Hypertension. Previous medication list noted that she had been treated with metoprolol 50 mg twice a day, Norvasc 5 mg daily, clonidine 0.3 mg every 8 hours, and losartan 50 mg daily. -On Catapres patch and IV metoprolol while intubated. -We'll transition over to by mouth when she can tolerate it.  Diabetes mellitus, insulin-dependent. [name] per previous medication list is treated chronically with NovoLog 16-30 units 3 times daily with meals and Lantus 25 units daily at bedtime. Her A1c was 6.6 on 07/07/16. -On IV steroid treatment, her CBGs increased. Sliding scale NovoLog was started. -We'll restart Lantus. Titrate accordingly.  Hypothyroidism. [name] is treated with Synthroid 50 g daily. Her TSH was within normal limits.  Seizure disorder. [name] is  treated with Lamictal 100 mg twice a day, pending medication list update.  Microcytic anemia. This is chronic. Will follow.  Nutrition. We'll try sips and ice chips. Will order ST consult to assess for dysphagia.   DVT prophylaxis: Lovenox Code Status: Full code Family Communication: Family not available Disposition Plan: Discharge to home versus nursing facility when clinically appropriate.   Consultants:   Pulmonology  Procedures:   Self extubation 10/18/16  EEG 10/17/16, results pending.  Intubation in the ED on 10/15/16.  Antimicrobials:   IV aztreonam 10/15/2016  IV Levaquin 10/15/2016  IV vancomycin 10/15/2016>>>>> 10/16/2016    Subjective: [name] self extubated earlier this morning. She denies chest pain, but endorses some shortness of breath. She denies nausea, vomiting, or diarrhea. She denies chest pain or abdominal pain.  Objective: Vitals:   10/18/16 0600 10/18/16 0700 10/18/16 0733 10/18/16 0800  BP: (!) 161/68 (!) 148/65  (!) 152/140  Pulse: 72 71 73 70  Resp: (!) 23 13 16 15   Temp: 98.4 F (36.9 C) 98.6 F (37 C) 98.4 F (36.9 C) 98.2 F (36.8 C)  TempSrc:      SpO2: 99% 98% 100% 99%  Weight:      Height:        Intake/Output Summary (Last 24 hours) at 10/18/16 0824 Last data filed at 10/18/16 0534  Gross per 24 hour  Intake          2091.46 ml  Output             1525 ml  Net           566.46 ml   Filed Weights   10/16/16 0500 10/17/16 0500 10/18/16  0500  Weight: 70.9 kg (156 lb 4.9 oz) 72.7 kg (160 lb 4.4 oz) 72.2 kg (159 lb 2.8 oz)    Examination:  General exam: ([name] self extubated on the morning of 10/18/16). Appears calm and comfortable  Respiratory system: A few scattered rhonchi; breathing appears to be nonlabored. Cardiovascular system: S1 & S2 heard, RRR. No JVD, murmurs, rubs, gallops or clicks. No pedal edema. Gastrointestinal system: Abdomen is nondistended, soft and mildly tender hypogastrium. No organomegaly or masses  felt. Normal bowel sounds heard. Central nervous system: Initially sleepy, but became Alert and oriented to herself, hospital, and president. No focal neurological deficits. Extremities: No acute hot red joints; mild peripheral atrophy. Skin: No rashes, lesions or ulcers Psychiatry: Judgement and insight appear normal. Mood & affect sad. "I'm tired of being sick".     Data Reviewed: I have personally reviewed following labs and imaging studies  CBC:  Recent Labs Lab 10/15/16 1605 10/15/16 1652 10/16/16 0332 10/17/16 0558 10/18/16 0458  WBC  --  6.7 5.3 9.6 7.6  NEUTROABS  --  3.9  --  8.7*  --   HGB 11.2* 10.2* 9.5* 9.2* 9.0*  HCT 33.0* 34.2* 31.7* 30.3* 29.9*  MCV  --  76.2* 76.0* 75.0* 76.3*  PLT  --  244 217 219 086   Basic Metabolic Panel:  Recent Labs Lab 10/15/16 1605 10/15/16 1652 10/15/16 2030 10/16/16 0332 10/17/16 0558 10/18/16 0458  NA 143 144  --  140 138 138  K 3.6 3.5  --  4.1 3.8 3.8  CL 105 107  --  112* 109 106  CO2  --  25  --  20* 22 21*  GLUCOSE 231* 233*  --  185* 198* 238*  BUN 32* 28*  --  19 10 11   CREATININE 1.90* 1.91*  --  0.93 0.65 0.63  CALCIUM  --  9.2  --  8.3* 8.0* 8.0*  MG  --   --  2.0  --   --   --    GFR: Estimated Creatinine Clearance: 61.4 mL/min (by C-G formula based on SCr of 0.63 mg/dL). Liver Function Tests:  Recent Labs Lab 10/15/16 1652 10/16/16 0332 10/17/16 0558  AST 29 27 24   ALT 22 19 18   ALKPHOS 96 86 77  BILITOT 0.4 0.3 0.6  PROT 6.9 6.2* 6.5  ALBUMIN 3.7 3.2* 3.2*   No results for input(s): LIPASE, AMYLASE in the last 168 hours.  Recent Labs Lab 10/15/16 1933 10/16/16 0932  AMMONIA 44* 40*   Coagulation Profile:  Recent Labs Lab 10/15/16 2030  INR 1.07   Cardiac Enzymes:  Recent Labs Lab 10/15/16 1933 10/15/16 2030 10/16/16 0332 10/16/16 0932  TROPONINI <0.03 <0.03 <0.03 <0.03   BNP (last 3 results) No results for input(s): PROBNP in the last 8760 hours. HbA1C:  Recent Labs   10/15/16 2031  HGBA1C 6.3*   CBG:  Recent Labs Lab 10/17/16 1608 10/17/16 1949 10/17/16 2307 10/18/16 0541 10/18/16 0722  GLUCAP 181* 203* 117* 215* 170*   Lipid Profile: No results for input(s): CHOL, HDL, LDLCALC, TRIG, CHOLHDL, LDLDIRECT in the last 72 hours. Thyroid Function Tests:  Recent Labs  10/15/16 2031  TSH 0.827   Anemia Panel:  Recent Labs  10/15/16 1933  VITAMINB12 2,322*  FOLATE 52.2  FERRITIN 37  TIBC 400  IRON 53  RETICCTPCT 1.9   Sepsis Labs:  Recent Labs Lab 10/15/16 1604 10/15/16 1917 10/15/16 1933 10/15/16 2030 10/16/16 0332 10/17/16 0558  PROCALCITON  --   --  <  0.10  --   --  <0.10  LATICACIDVEN 3.14* 1.38  --  1.7 2.5*  --     Recent Results (from the past 240 hour(s))  Blood Culture (routine x 2)     Status: None (Preliminary result)   Collection Time: 10/15/16  4:35 PM  Result Value Ref Range Status   Specimen Description BLOOD LEFT HAND  Final   Special Requests   Final    Blood Culture results may not be optimal due to an inadequate volume of blood received in culture bottles   Culture NO GROWTH 3 DAYS  Final   Report Status PENDING  Incomplete  Blood Culture (routine x 2)     Status: None (Preliminary result)   Collection Time: 10/15/16  4:45 PM  Result Value Ref Range Status   Specimen Description BLOOD LEFT WRIST  Final   Special Requests   Final    Blood Culture results may not be optimal due to an inadequate volume of blood received in culture bottles   Culture NO GROWTH 3 DAYS  Final   Report Status PENDING  Incomplete  Urine Culture     Status: None   Collection Time: 10/15/16  5:00 PM  Result Value Ref Range Status   Specimen Description URINE, CATHETERIZED  Final   Special Requests NONE  Final   Culture   Final    NO GROWTH Performed at [facility], [address], [place], [place] [address]    Report Status 10/17/2016 FINAL  Final  MRSA PCR Screening     Status: None   Collection Time: 10/15/16   8:08 PM  Result Value Ref Range Status   MRSA by PCR NEGATIVE NEGATIVE Final    Comment:        The GeneXpert MRSA Assay (FDA approved for NASAL specimens only), is one component of a comprehensive MRSA colonization surveillance program. It is not intended to diagnose MRSA infection nor to guide or monitor treatment for MRSA infections.   Culture, respiratory (NON-Expectorated)     Status: None (Preliminary result)   Collection Time: 10/15/16  9:00 PM  Result Value Ref Range Status   Specimen Description SPUTUM  Final   Special Requests NONE  Final   Gram Stain   Final    MODERATE WBC PRESENT,BOTH PMN AND MONONUCLEAR NO SQUAMOUS EPITHELIAL CELLS SEEN NO ORGANISMS SEEN    Culture   Final    NO GROWTH < 24 HOURS Performed at [facility], [place] [address] [place], [place] [address]    Report Status PENDING  Incomplete         Radiology Studies: Dg Abd 1 View  Result Date: 10/16/2016 CLINICAL DATA:  Abdominal pain. EXAM: ABDOMEN - 1 VIEW COMPARISON:  10/10/2016 FINDINGS: Cholecystectomy clips are identified within the right upper quadrant. There is a nasogastric tube with tip in the distal stomach. The bowel gas pattern is nonobstructed. No dilated loops of small or large bowel. IMPRESSION: 1. Nonobstructive bowel gas pattern. Electronically Signed   By: Kerby Moors M.D.   On: 10/16/2016 15:15   Dg Chest Port 1 View  Result Date: 10/17/2016 CLINICAL DATA:  Acute respiratory failure.  COPD EXAM: PORTABLE CHEST 1 VIEW COMPARISON:  10/16/2016 FINDINGS: Endotracheal to tube 6.5 cm from carina. Tube projects over the thoracic inlet. NG tube in stomach. Extremely low lung volumes. Bibasilar atelectasis. No pulmonary edema. IMPRESSION: 1. Endotracheal tube appears high; however, this may be positional. 2. Low lung volumes  and basilar atelectasis not changed. Electronically Signed   By: Suzy Bouchard M.D.   On: 10/17/2016 09:04        Scheduled Meds: . amLODipine  10  mg Per Tube Daily  . arformoterol  15 mcg Nebulization BID  . budesonide (PULMICORT) nebulizer solution  0.5 mg Nebulization BID  . chlorhexidine gluconate (MEDLINE KIT)  15 mL Mouth Rinse BID  . cloNIDine  0.3 mg Per Tube TID  . enoxaparin (LOVENOX) injection  40 mg Subcutaneous Q24H  . insulin aspart  0-9 Units Subcutaneous Q4H  . ipratropium-albuterol  3 mL Nebulization Q6H  . lactulose  30 g Oral BID  . lamoTRIgine  100 mg Per Tube BID  . levothyroxine  25 mcg Intravenous QAC breakfast  . mouth rinse  15 mL Mouth Rinse QID  . methylPREDNISolone (SOLU-MEDROL) injection  80 mg Intravenous Q12H  . metoprolol tartrate  5 mg Intravenous Q8H  . pantoprazole (PROTONIX) IV  40 mg Intravenous Q12H  . sertraline  200 mg Per Tube Daily   Continuous Infusions: . sodium chloride 1,000 mL (10/17/16 2159)  . aztreonam Stopped (10/17/16 2340)  . fentaNYL infusion INTRAVENOUS Stopped (10/18/16 0620)  . levofloxacin (LEVAQUIN) IV Stopped (10/17/16 1944)     LOS: 3 days    Time spent: 61 minutes    Rexene Alberts, MD [facility] Pager [phone]  If 7PM-7AM, please contact night-coverage [email or web address] Password [facility] 10/18/2016, 8:24 AM

## 2016-10-18 NOTE — Progress Notes (Signed)
Patient(0620) has self extubated herself after several attempts to make her comfortable and assist her to get some rest for the night. She was placed on a NREBRE mask to maintain saturations and ensure patient wasn't in any distress.Her vitals at this time are  HR 88 RR 19 BP 160/79 temp.36.9.

## 2016-10-19 DIAGNOSIS — J9601 Acute respiratory failure with hypoxia: Secondary | ICD-10-CM

## 2016-10-19 DIAGNOSIS — G4733 Obstructive sleep apnea (adult) (pediatric): Secondary | ICD-10-CM

## 2016-10-19 DIAGNOSIS — R1312 Dysphagia, oropharyngeal phase: Secondary | ICD-10-CM

## 2016-10-19 LAB — GLUCOSE, CAPILLARY
GLUCOSE-CAPILLARY: 222 mg/dL — AB (ref 65–99)
GLUCOSE-CAPILLARY: 163 mg/dL — AB (ref 65–99)
GLUCOSE-CAPILLARY: 148 mg/dL — AB (ref 65–99)
Glucose-Capillary: 186 mg/dL — ABNORMAL HIGH (ref 65–99)
Glucose-Capillary: 201 mg/dL — ABNORMAL HIGH (ref 65–99)

## 2016-10-19 LAB — PROCALCITONIN

## 2016-10-19 MED ORDER — PREDNISONE 20 MG PO TABS
40.0000 mg | ORAL_TABLET | Freq: Two times a day (BID) | ORAL | Status: DC
  Administered 2016-10-19 – 2016-10-20 (×2): 40 mg via ORAL
  Filled 2016-10-19 (×2): qty 2

## 2016-10-19 MED ORDER — CHLORHEXIDINE GLUCONATE 0.12 % MT SOLN: 15.0000 mL | Freq: Two times a day (BID) | OROMUCOSAL | Status: DC

## 2016-10-19 MED ORDER — TRAZODONE HCL 50 MG PO TABS
50.0000 mg | ORAL_TABLET | Freq: Every evening | ORAL | Status: DC | PRN
  Administered 2016-10-19: 50 mg via ORAL
  Filled 2016-10-19: qty 1

## 2016-10-19 MED ORDER — ORAL CARE MOUTH RINSE: 15.0000 mL | Freq: Two times a day (BID) | OROMUCOSAL | Status: DC

## 2016-10-19 MED ORDER — LOSARTAN POTASSIUM 50 MG PO TABS
50.0000 mg | ORAL_TABLET | Freq: Every day | ORAL | Status: DC
  Administered 2016-10-19 – 2016-10-20 (×2): 50 mg via ORAL
  Filled 2016-10-19 (×2): qty 1

## 2016-10-19 MED ORDER — IPRATROPIUM-ALBUTEROL 0.5-2.5 (3) MG/3ML IN SOLN
3.0000 mL | Freq: Three times a day (TID) | RESPIRATORY_TRACT | Status: DC
  Administered 2016-10-20 (×2): 3 mL via RESPIRATORY_TRACT
  Filled 2016-10-19 (×3): qty 3

## 2016-10-19 MED ORDER — CLONIDINE HCL 0.2 MG PO TABS
0.3000 mg | ORAL_TABLET | Freq: Three times a day (TID) | ORAL | Status: DC
  Administered 2016-10-19 – 2016-10-20 (×4): 0.3 mg via ORAL
  Filled 2016-10-19 (×4): qty 1

## 2016-10-19 MED ORDER — KCL IN DEXTROSE-NACL 20-5-0.9 MEQ/L-%-% IV SOLN
INTRAVENOUS | Status: DC
  Administered 2016-10-19 – 2016-10-20 (×2): via INTRAVENOUS

## 2016-10-19 MED ORDER — ORAL CARE MOUTH RINSE
15.0000 mL | Freq: Two times a day (BID) | OROMUCOSAL | Status: DC
  Administered 2016-10-19: 15 mL via OROMUCOSAL

## 2016-10-19 MED ORDER — PANTOPRAZOLE SODIUM 40 MG PO TBEC
40.0000 mg | DELAYED_RELEASE_TABLET | Freq: Two times a day (BID) | ORAL | Status: DC
  Administered 2016-10-19 – 2016-10-20 (×3): 40 mg via ORAL
  Filled 2016-10-19 (×3): qty 1

## 2016-10-19 NOTE — Progress Notes (Signed)
  Speech Language Pathology Treatment: Dysphagia  Patient Details Name: [name] MRN: [id number] DOB: 07/08/1950 Today's Date: 10/19/2016 Time: 0930-1005 SLP Time Calculation (min) (ACUTE ONLY): 35 min  Assessment / Plan / Recommendation Clinical Impression  Pt with improved alertness today and appears to be much improved compared to yesterday's clinical swallow evaluation. Pt denies feeling hungry, but agreeable to trials (ice chips, thin water via cup and straw, ice cream, puree, and crackers). Pt reports h/o esophageal dysphagia with dilation in the past and also states that she has a goiter. Pt consumed all foods/liquids with hand over hand feeder assist to stabilize and prevent spilling without overt signs or symptoms of aspiration. Pt did have one episode of a dry cough early on in trials, but nothing further. Vocal quality remained clear and vitals stable. Recommend initiation of D3/mech soft and thin liquids cup/straw as long as patient is ALERT and UPRIGHT (ideally sitting up in chair). SLP will follow for diet tolerance; no MBSS indicated at this time but can complete if MD desires.   HPI HPI: 66 y.o. female with medical history significant of COPD, OSA, CVA with unclear hemiplegia per chart, IDDM, bipolar disorder dropped off in the ED waiting area by family  Members earlier today for altered mental status.  Pt was found to be sleepy hypotensive and was intubated in the ED for airway protection.  No history can be obtained from patient and no family present.  Pt is lightly sedated at this time, she arouses to voice and her ble seem to have muscle wasting , unknown if she is chronically bedbound or what her baseline functional status is.  Her bp has normalized with 30cc/kg ivf bolus in ED per sepsis protocol.  Pt referred for admission for acute respiratory failure of unclear etiology      SLP Plan  Continue with current plan of care       Recommendations  Diet recommendations:  Dysphagia 3 (mechanical soft);Thin liquid Liquids provided via: Cup;Straw Medication Administration: Whole meds with puree Supervision: Patient able to self feed;Intermittent supervision to cue for compensatory strategies Compensations: Slow rate;Small sips/bites Postural Changes and/or Swallow Maneuvers: Seated upright 90 degrees;Upright 30-60 min after meal;Out of bed for meals                Oral Care Recommendations: Oral care BID;Staff/trained caregiver to provide oral care Follow up Recommendations: None (pending) SLP Visit Diagnosis: Dysphagia, oropharyngeal phase (R13.12) Plan: Continue with current plan of care       Thank you,  Genene Churn, [place]                 [place] 10/19/2016, 10:09 AM

## 2016-10-19 NOTE — Evaluation (Signed)
Physical Therapy Evaluation Patient Details Name: [name] MRN: [id number] DOB: 12-01-1950 Today's Date: 10/19/2016   History of Present Illness  [name] is a 66yo black female who comes to [facility] on 8/5 lethargic, hypotensive, and weak, requiring intubation in the ED: pt found to have sepsis d/t UTI c AKI, as well as PNA. PMH: COPD, OSA c lt hemiplegia, CVA, IDDM, bipolar disorder, cervical readiculopathy.  She lives alone at home with aides in house 7d/wk  and was current with [facility] PTA. Pt is familiar to this author from PT evalaution 1MA.   Clinical Impression  Pt admitted with above diagnosis. Pt currently with functional limitations due to the deficits listed below (see "PT Problem List"). Pt still requiring O2 for saturation at this time, remains lethargic and mildly confused. She has adequate A/E in the home, and caregiver services 7d/week. Pt was working with HHPT PTA and making progress. Pt will benefit from skilled PT intervention to increase independence and safety with basic mobility in preparation for discharge to the venue listed below.       Follow Up Recommendations Home health PT;Supervision for mobility/OOB (resume HHPT services with [facility] at DC )    Equipment Recommendations  None recommended by PT    Recommendations for Other Services       Precautions / Restrictions Precautions Precautions: Fall Precaution Comments: no score available at time of eval.  Restrictions Weight Bearing Restrictions: No      Mobility  Bed Mobility Overal bed mobility: Needs Assistance (stand-by assist with RN prior to entry ) Bed Mobility: Supine to Sit     Supine to sit: Supervision        Transfers Overall transfer level: Needs assistance Equipment used: None Transfers: Stand Pivot Transfers   Stand pivot transfers: Min guard (stand-by assist with RN prior to entry)          Ambulation/Gait                Stairs            Wheelchair  Mobility    Modified Rankin (Stroke Patients Only)       Balance                                             Pertinent Vitals/Pain Pain Assessment: No/denies pain    Home Living Family/patient expects to be discharged to:: Private residence Living Arrangements: Alone Available Help at Discharge: Personal care attendant (7hrs M-F; 2hrs Sa-Su ) Type of Home: Apartment Home Access: Level entry     Home Layout: One level Home Equipment: Walker - 2 wheels;Cane - single point;Bedside commode;Shower seat;Wheelchair - manual;Hospital bed      Prior Function Level of Independence: Needs assistance   Gait / Transfers Assistance Needed: limited community distance AMB with [facility] about 1 MA, making progress with HHPT but not yet accessing community   ADL's / Homemaking Assistance Needed: Aides assist with ADL.         Hand Dominance        Extremity/Trunk Assessment   Upper Extremity Assessment Upper Extremity Assessment: Generalized weakness (unable to open hand sanitizer pouch with lunch tray)    Lower Extremity Assessment Lower Extremity Assessment: Generalized weakness;Overall WFL for tasks assessed       Communication   Communication: No difficulties  Cognition Arousal/Alertness: Lethargic Behavior During Therapy:  (  mildly tangential, unclear if related to [facility] or other. ) Overall Cognitive Status: Difficult to assess                                 General Comments: lots of slurred speech, difficult to understand.       General Comments      Exercises     Assessment/Plan    PT Assessment Patient needs continued PT services  PT Problem List Decreased strength;Decreased activity tolerance;Decreased mobility       PT Treatment Interventions Functional mobility training;Therapeutic activities;Therapeutic exercise;Balance training;Patient/family education    PT Goals (Current goals can be found in the Care Plan section)   Acute Rehab PT Goals PT Goal Formulation: Patient unable to participate in goal setting    Frequency Min 2X/week   Barriers to discharge        Co-evaluation               AM-PAC PT "6 Clicks" Daily Activity  Outcome Measure Difficulty turning over in bed (including adjusting bedclothes, sheets and blankets)?: A Lot Difficulty moving from lying on back to sitting on the side of the bed? : A Lot Difficulty sitting down on and standing up from a chair with arms (e.g., wheelchair, bedside commode, etc,.)?: A Lot Help needed moving to and from a bed to chair (including a wheelchair)?: Total Help needed walking in hospital room?: Total Help needed climbing 3-5 steps with a railing? : Total 6 Click Score: 9    End of Session Equipment Utilized During Treatment: Oxygen Activity Tolerance: Patient tolerated treatment well;Patient limited by lethargy Patient left: in chair;with family/visitor present;with call bell/phone within reach Nurse Communication: Mobility status PT Visit Diagnosis: Difficulty in walking, not elsewhere classified (R26.2);Unsteadiness on feet (R26.81);Muscle weakness (generalized) (M62.81)    Time: 9924-2683 PT Time Calculation (min) (ACUTE ONLY): 10 min   Charges:   PT Evaluation $PT Eval Moderate Complexity: 1 Mod     PT G Codes:        12:21 PM, October 23, 2016 Etta Grandchild, PT, DPT Physical Therapist - [place] [phone] (Office)   Buccola,Allan C 10-23-16, 12:20 PM

## 2016-10-19 NOTE — Care Management Note (Signed)
Case Management Note  Patient Details  Name: JENILYN MAGANA MRN: [id number] Date of Birth: 1950/04/27  Subjective/Objective:   Adm with acute respiratory failure with hypercapnia. Self extubated on 10/18/16. From home alone but has caregivers 7 days a week-( M-F fo 6 hours and 2 hours on weekends). She is also active with [facility] home [facility]. She reports have a CPAP at home provided by [place].                  Action/Plan:Anticipate DC home with resumption of home health and caregivers.    Expected Discharge Date:  10/20/16               Expected Discharge Plan:  [place]  In-House Referral:     Discharge planning Services  CM Consult  Post Acute Care Choice:  Home Health, Resumption of Svcs/PTA Provider Choice offered to:  Patient  DME Arranged:    DME Agency:     HH Arranged:    [place]:  Plain  Status of Service:  In process, will continue to follow  If discussed at Long Length of Stay Meetings, dates discussed:    Additional Comments:  Varetta Chavers, Chauncey Reading, RN 10/19/2016, 3:19 PM

## 2016-10-19 NOTE — Progress Notes (Signed)
PROGRESS NOTE    [name]  HTD:[id number] DOB: [birth year] DOA: 10/15/2016 PCP: [name], MD    Brief Narrative:  Patient  is a 66 y.o. female with medical history significant of COPD, OSA, CVA with unclear hemiplegia per chart, IDDM, OSA on CPAP, bipolar disorder and polypharmacy. She was apparently dropped off in the ED waiting area by family members evaluation of altered mental status.  Pt was found to be sleepy and hypotensive and was intubated in the ED for airway protection.  No history could be obtained from patient and no family was present. In the ED, she was initially hypotensive with a blood pressure of 87/52. Following IV fluid bolus, her blood pressure improved to 157/67. She was afebrile. Labs notable for lithium level less than 0.06, ammonia level 44, lactic acid of 3.14, glucose 233, creatinine 1.91, normal WBC, hemoglobin of 10.2, UA negative for nitrite and leukocytes, but with many bacteria. Noncontrasted head CT revealed status post bifrontal craniotomy with no evidence for acute injury. Chest x-ray revealed low inspiration on the right mid and lower lung zone, atelectasis/scarring. She was admitted for further evaluation and management.   Assessment & Plan:   Principal Problem:   Acute respiratory failure with hypercapnia (HCC) Active Problems:   Acute metabolic encephalopathy   Pulmonary hypertension (HCC)   HCAP (healthcare-associated pneumonia)/Aspiration PNA   Hyperlipidemia   GERD   Obstructive sleep apnea   Oropharyngeal dysphagia   Constipation   Polypharmacy   Bipolar disorder (HCC)   Hemiplegia and hemiparesis following cerebral infarction affecting unspecified side ([place])   Seizure disorder (HCC)   Gastroparesis   Hypothyroidism   COPD (chronic obstructive pulmonary disease) with chronic bronchitis (HCC)   Microcytic anemia   Poorly controlled type 2 diabetes mellitus with circulatory disorder (HCC)   Labile hypertension   Class 1 obesity  with serious comorbidity and body mass index (BMI) of 33.0 to 33.9 in adult   Diabetic polyneuropathy associated with type 2 diabetes mellitus ([place])   AKI (acute kidney injury) ([place])   1. Acute encephalopathy. Patient's head CT on admission revealed chronic stable changes. She was intubated due to airway protection initially, but was found to have an initial ABG of pH 7.24, PCO2 of 55, and PO2 of 385 following intubation. Her chest x-ray initially revealed right lower lobe atelectasis, but the follow-up chest x-ray revealed findings more consistent with pneumonia. Her ammonia level was only slightly elevated. Lactulose was given 2 empirically, but hepatic encephalopathy was not likely. -Her TSH was within normal limits. HIV was nonreactive. RPR was negative. -With treatment of her pneumonia, respiratory failure, and metabolic derangements, her acute encephalopathy has resolved. -The etiology was likely multifactorial, but infection and CO2 narcosis are the most likely etiologies. Also consider polypharmacy with her psychotropic medications.  Acute respiratory failure with hypercapnia and hypoxia. Patient has a history of COPD and obstructive sleep apnea. -She was intubated in the ED for airway protection. Follow-up ABG revealed a pH of 7.24, PCO2 of 55, and PO2 of 385 following intubation. -Pulmonologist, Dr. Luan Pulling was consulted and assisted with ventilator management. -Patient self extubated on the morning of 10/28/16. -Follow-up ABG revealed improvement with resolution of hypercapnia, but with hypoxia with a PO2 of 53. -Oxygen titrated to keep her oxygen saturation greater than 90%. Currently improved. -We will restart CPAP nightly for OSA or BiPAP when necessary. -Likely etiology is COPD exacerbation and HCAP or ? Aspiration pneumonia.  Healthcare associated pneumonia/possible aspiration pneumonia with probable COPD  exacerbation. As stated above, the patient was intubated for airway  protection and the follow-up ABG is mentioned above. -Follow-up chest x-ray on 10/16/16 was consistent with pneumonia of the right lower lobe which may be associated with aspiration. -Blood cultures ordered and have remained negative to date. Sputum culture ordered and has been negative to date. MRSA  by PCR was negative. -Patient was started on broad-spectrum antibiotics with aztreonam, Levaquin, and vancomycin. Vancomycin was discontinued when MRSA screen was negative. -IV Solu-Medrol and bronchodilators were started for COPD exacerbation. We'll continue to wean steroid as appropriate. (Patient is currently on Brovana neb, Pulmicort neb, and DuoNeb.) -We'll discontinue Brovana and aztreonam. Continue other bronchodilators and Levaquin. -Currently stable/improvement clinically.  Acute kidney injury, secondary to prerenal azotemia. Patient's creatinine was 1.91 on admission. Baseline creatinine from 09/2016 was within normal limits. Patient was started on IV fluids for hydration. Losartan was held. -Her creatinine has normalized.  Bipolar disorder with polypharmacy. It is unclear what the patient's home regimen is. Her lithium level was found to be low, but she may not be on lithium. Previous medication list noted that she was on Risperdal 0.5 mg daily at bedtime, sertraline 200 mg daily, trazodone 200 mg at bedtime and lorazepam 0.5 mg 3 times a day. -Sertraline dosing was decreased to 50 mg twice a day. Risperdal was restarted. Continue lorazepam when necessary, but not scheduled yet. We'll restart trazodone when necessary at a lower dose.  Hypertension. Previous medication list noted that she had been treated with metoprolol 50 mg twice a day, Norvasc 5 mg daily, clonidine 0.3 mg every 8 hours, and losartan 50 mg daily. -She was started on Catapres patch and IV metoprolol while intubated. -Clonidine and Norvasc changed to by mouth. Will restart losartan due to elevated blood pressures and  improvement in her renal function.  Diabetes mellitus, insulin-dependent. Patient per previous medication list is treated chronically with NovoLog 16-30 units 3 times daily with meals and Lantus 25 units daily at bedtime. Her A1c was 6.6 on 07/07/16. -On IV steroid treatment, her CBGs increased. Sliding scale NovoLog and Lantus were started.Titrate accordingly. -Her hemoglobin A1c was 6.3.  Hypothyroidism. Patient is treated with Synthroid 50 g daily. Her TSH was within normal limits. Synthroid was continued.  Seizure disorder. Patient is treated with Lamictal 100 mg twice a day, pending medication list update. It was restarted.  Microcytic anemia. This is chronic. Will follow.  Oropharyngeal dysphagia. Speech therapy consulted due to possibility of aspiration pneumonia. -Initial assessment revealed oropharyngeal dysphagia. MBS is pending. Patient is nothing by mouth until further results noted.  Deconditioning. We'll order PT consult.   DVT prophylaxis: Lovenox Code Status: Full code Family Communication: Family not available Disposition Plan: Discharge to home versus nursing facility when clinically appropriate.   Consultants:   Pulmonology  Procedures:   Self extubation 10/18/16  EEG 10/17/16, results pending.  Intubation in the ED on 10/15/16.  Antimicrobials:   IV aztreonam 10/15/2016  IV Levaquin 10/15/2016  IV vancomycin 10/15/2016>>>>> 10/16/2016    Subjective: Patient denies chest pain or shortness of breath at rest.  Objective: Vitals:   10/19/16 0737 10/19/16 0755 10/19/16 0803 10/19/16 0808  BP:      Pulse: 63     Resp: 18     Temp: 99.7 F (37.6 C)     TempSrc: Oral     SpO2: 100% 97% 100% 100%  Weight:      Height:        Intake/Output Summary (Last  24 hours) at 10/19/16 0951 Last data filed at 10/19/16 0901  Gross per 24 hour  Intake          2036.67 ml  Output             6000 ml  Net         -3963.33 ml   Filed Weights   10/17/16  0500 10/18/16 0500 10/19/16 0500  Weight: 72.7 kg (160 lb 4.4 oz) 72.2 kg (159 lb 2.8 oz) 69.9 kg (154 lb 1.6 oz)    Examination:  General exam: Appears calm and comfortable  Respiratory system: Clear lungs anteriorly; breathing appears to be nonlabored. Cardiovascular system: S1 & S2 heard, RRR. No JVD, murmurs, rubs, gallops or clicks. No pedal edema. Gastrointestinal system: Abdomen is nondistended, soft and mildly tender hypogastrium. No organomegaly or masses felt. Normal bowel sounds heard. Central nervous system: Alert and oriented 3. Patient able to raise each leg 45. Hand grip fair to strong 5 minus over 5 bilaterally. Extremities: No acute hot red joints; mild peripheral atrophy. Skin: No rashes, lesions or ulcers Psychiatry: Judgement and insight appear normal.     Data Reviewed: I have personally reviewed following labs and imaging studies  CBC:  Recent Labs Lab 10/15/16 1605 10/15/16 1652 10/16/16 0332 10/17/16 0558 10/18/16 0458  WBC  --  6.7 5.3 9.6 7.6  NEUTROABS  --  3.9  --  8.7*  --   HGB 11.2* 10.2* 9.5* 9.2* 9.0*  HCT 33.0* 34.2* 31.7* 30.3* 29.9*  MCV  --  76.2* 76.0* 75.0* 76.3*  PLT  --  244 217 219 836   Basic Metabolic Panel:  Recent Labs Lab 10/15/16 1605 10/15/16 1652 10/15/16 2030 10/16/16 0332 10/17/16 0558 10/18/16 0458  NA 143 144  --  140 138 138  K 3.6 3.5  --  4.1 3.8 3.8  CL 105 107  --  112* 109 106  CO2  --  25  --  20* 22 21*  GLUCOSE 231* 233*  --  185* 198* 238*  BUN 32* 28*  --  19 10 11   CREATININE 1.90* 1.91*  --  0.93 0.65 0.63  CALCIUM  --  9.2  --  8.3* 8.0* 8.0*  MG  --   --  2.0  --   --   --    GFR: Estimated Creatinine Clearance: 60.4 mL/min (by C-G formula based on SCr of 0.63 mg/dL). Liver Function Tests:  Recent Labs Lab 10/15/16 1652 10/16/16 0332 10/17/16 0558  AST 29 27 24   ALT 22 19 18   ALKPHOS 96 86 77  BILITOT 0.4 0.3 0.6  PROT 6.9 6.2* 6.5  ALBUMIN 3.7 3.2* 3.2*   No results for  input(s): LIPASE, AMYLASE in the last 168 hours.  Recent Labs Lab 10/15/16 1933 10/16/16 0932  AMMONIA 44* 40*   Coagulation Profile:  Recent Labs Lab 10/15/16 2030  INR 1.07   Cardiac Enzymes:  Recent Labs Lab 10/15/16 1933 10/15/16 2030 10/16/16 0332 10/16/16 0932  TROPONINI <0.03 <0.03 <0.03 <0.03   BNP (last 3 results) No results for input(s): PROBNP in the last 8760 hours. HbA1C: No results for input(s): HGBA1C in the last 72 hours. CBG:  Recent Labs Lab 10/18/16 1606 10/18/16 2014 10/18/16 2356 10/19/16 0356 10/19/16 0735  GLUCAP 183* 153* 147* 222* 186*   Lipid Profile: No results for input(s): CHOL, HDL, LDLCALC, TRIG, CHOLHDL, LDLDIRECT in the last 72 hours. Thyroid Function Tests: No results for input(s): TSH, T4TOTAL, FREET4,  T3FREE, THYROIDAB in the last 72 hours. Anemia Panel: No results for input(s): VITAMINB12, FOLATE, FERRITIN, TIBC, IRON, RETICCTPCT in the last 72 hours. Sepsis Labs:  Recent Labs Lab 10/15/16 1604 10/15/16 1917 10/15/16 1933 10/15/16 2030 10/16/16 0332 10/17/16 0558 10/19/16 0537  PROCALCITON  --   --  <0.10  --   --  <0.10 <0.10  LATICACIDVEN 3.14* 1.38  --  1.7 2.5*  --   --     Recent Results (from the past 240 hour(s))  Blood Culture (routine x 2)     Status: None (Preliminary result)   Collection Time: 10/15/16  4:35 PM  Result Value Ref Range Status   Specimen Description BLOOD LEFT HAND  Final   Special Requests   Final    Blood Culture results may not be optimal due to an inadequate volume of blood received in culture bottles   Culture NO GROWTH 4 DAYS  Final   Report Status PENDING  Incomplete  Blood Culture (routine x 2)     Status: None (Preliminary result)   Collection Time: 10/15/16  4:45 PM  Result Value Ref Range Status   Specimen Description BLOOD LEFT WRIST  Final   Special Requests   Final    Blood Culture results may not be optimal due to an inadequate volume of blood received in culture  bottles   Culture NO GROWTH 4 DAYS  Final   Report Status PENDING  Incomplete  Urine Culture     Status: None   Collection Time: 10/15/16  5:00 PM  Result Value Ref Range Status   Specimen Description URINE, CATHETERIZED  Final   Special Requests NONE  Final   Culture   Final    NO GROWTH Performed at [facility], [address] [place], [place] [address]    Report Status 10/17/2016 FINAL  Final  MRSA PCR Screening     Status: None   Collection Time: 10/15/16  8:08 PM  Result Value Ref Range Status   MRSA by PCR NEGATIVE NEGATIVE Final    Comment:        The GeneXpert MRSA Assay (FDA approved for NASAL specimens only), is one component of a comprehensive MRSA colonization surveillance program. It is not intended to diagnose MRSA infection nor to guide or monitor treatment for MRSA infections.   Culture, respiratory (NON-Expectorated)     Status: None   Collection Time: 10/15/16  9:00 PM  Result Value Ref Range Status   Specimen Description SPUTUM  Final   Special Requests NONE  Final   Gram Stain   Final    MODERATE WBC PRESENT,BOTH PMN AND MONONUCLEAR NO SQUAMOUS EPITHELIAL CELLS SEEN NO ORGANISMS SEEN    Culture   Final    NO GROWTH 2 DAYS Performed at [facility], [address] [place], [place] [address]    Report Status 10/18/2016 FINAL  Final         Radiology Studies: No results found.      Scheduled Meds: . amLODipine  5 mg Oral Daily  . arformoterol  15 mcg Nebulization BID  . budesonide (PULMICORT) nebulizer solution  0.5 mg Nebulization BID  . chlorhexidine  15 mL Mouth Rinse BID  . cloNIDine  0.3 mg Per Tube TID  . enoxaparin (LOVENOX) injection  40 mg Subcutaneous Q24H  . insulin aspart  0-9 Units Subcutaneous Q4H  . insulin glargine  12 Units Subcutaneous QHS  . ipratropium-albuterol  3 mL Nebulization Q6H  .  lamoTRIgine  100 mg Oral BID  . levothyroxine  25 mcg Intravenous QAC breakfast  . mouth rinse  15 mL Mouth  Rinse q12n4p  . methylPREDNISolone (SOLU-MEDROL) injection  60 mg Intravenous Q12H  . metoprolol tartrate  5 mg Intravenous Q8H  . pantoprazole (PROTONIX) IV  40 mg Intravenous Q12H  . risperiDONE  0.5 mg Oral QHS  . sertraline  50 mg Oral BID   Continuous Infusions: . sodium chloride 1,000 mL (10/18/16 1700)  . aztreonam 1 g (10/19/16 0811)  . fentaNYL infusion INTRAVENOUS Stopped (10/18/16 0620)  . levofloxacin (LEVAQUIN) IV Stopped (10/18/16 1934)     LOS: 4 days    Time spent: 54 minutes    Rexene Alberts, MD [facility] Pager [phone]  If 7PM-7AM, please contact night-coverage [email or web address] Password [email or web address] 10/19/2016, 9:51 AM

## 2016-10-19 NOTE — Progress Notes (Signed)
Subjective: She says she feels okay. She self extubated yesterday morning and although her PCO2 was quite high even with ventilator support it was better yesterday. She has been on nasal oxygen and done okay. She is more alert this morning. She says she is still coughing significantly. She had speech evaluation yesterday with recommendation that she be nothing by mouth until she could complete modified barium swallow. I think it's very likely that she aspirated during her episode of reduced mental status.  Objective: Vital signs in last 24 hours: Temp:  [98.1 F (36.7 C)-99.5 F (37.5 C)] 98.8 F (37.1 C) (08/09 0600) Pulse Rate:  [61-85] 61 (08/09 0600) Resp:  [15-29] 21 (08/09 0600) BP: (152-180)/(63-140) 170/66 (08/09 0600) SpO2:  [90 %-100 %] 97 % (08/09 0600) Weight:  [69.9 kg (154 lb 1.6 oz)] 69.9 kg (154 lb 1.6 oz) (08/09 0500) Weight change: -2.3 kg (-5 lb 1.1 oz) Last BM Date: 10/18/16  Intake/Output from previous day: 08/08 0701 - 08/09 0700 In: 2036.7 [I.V.:1886.7; IV Piggyback:150] Out: 6000 [Urine:6000]  PHYSICAL EXAM General appearance: alert, cooperative and mild distress Resp: rhonchi bilaterally Cardio: regular rate and rhythm, S1, S2 normal, no murmur, click, rub or gallop GI: soft, non-tender; bowel sounds normal; no masses,  no organomegaly Extremities: extremities normal, atraumatic, no cyanosis or edema Skin warm and dry  Lab Results:  Results for orders placed or performed during the hospital encounter of 10/15/16 (from the past 48 hour(s))  Glucose, capillary     Status: Abnormal   Collection Time: 10/17/16  7:59 AM  Result Value Ref Range   Glucose-Capillary 169 (H) 65 - 99 mg/dL  Blood gas, arterial     Status: Abnormal   Collection Time: 10/17/16  9:25 AM  Result Value Ref Range   FIO2 35.00    Delivery systems VENTILATOR    Mode PRESSURE REGULATED VOLUME CONTROL    VT 400 mL   LHR 16 resp/min   Peep/cpap 5.0 cm H20   pH, Arterial 7.267 (L)  7.350 - 7.450   pCO2 arterial 51.0 (H) 32.0 - 48.0 mmHg   pO2, Arterial 71.5 (L) 83.0 - 108.0 mmHg   Bicarbonate 20.9 20.0 - 28.0 mmol/L   Acid-base deficit 3.5 (H) 0.0 - 2.0 mmol/L   O2 Saturation 92.1 %   Patient temperature 37.0    Collection site RIGHT RADIAL    Drawn by [id number]    Sample type ARTERIAL DRAW    Allens test (pass/fail) PASS PASS  RPR     Status: None   Collection Time: 10/17/16 10:08 AM  Result Value Ref Range   RPR Ser Ql Non Reactive Non Reactive    Comment: (NOTE) Performed At: [facility] [place], [place] [id number] Lindon Romp MD [id number]   HIV antibody (routine testing) (NOT for [facility])     Status: None   Collection Time: 10/17/16 10:08 AM  Result Value Ref Range   HIV Screen 4th Generation wRfx Non Reactive Non Reactive    Comment: (NOTE) Performed At: [facility] [address] [place], [place] [id number] Lindon Romp MD IB:[id number]   Blood gas, arterial     Status: Abnormal   Collection Time: 10/17/16 11:30 AM  Result Value Ref Range   FIO2 40.00    Delivery systems VENTILATOR    Mode CONTINUOUS POSITIVE AIRWAY PRESSURE    Peep/cpap 5.0 cm H20   Pressure support 5 cm H20   pH, Arterial 7.211 (L) 7.350 - 7.450  pCO2 arterial 60.1 (H) 32.0 - 48.0 mmHg    Comment: CRITICAL RESULT CALLED TO, READ BACK BY AND VERIFIED WITH: MOTLEY,J.RN AT 1140 10/17/16 BY BROADNAX,L.RRT    pO2, Arterial 85.0 83.0 - 108.0 mmHg   Bicarbonate 20.6 20.0 - 28.0 mmol/L   Acid-base deficit 3.6 (H) 0.0 - 2.0 mmol/L   O2 Saturation 94.5 %   Patient temperature 37.0    Collection site RIGHT RADIAL    Drawn by [phone]    Sample type ARTERIAL DRAW    Allens test (pass/fail) PASS PASS  Glucose, capillary     Status: Abnormal   Collection Time: 10/17/16 12:08 PM  Result Value Ref Range   Glucose-Capillary 181 (H) 65 - 99 mg/dL  Glucose, capillary     Status: Abnormal   Collection Time: 10/17/16  4:08 PM  Result Value Ref  Range   Glucose-Capillary 181 (H) 65 - 99 mg/dL  Glucose, capillary     Status: Abnormal   Collection Time: 10/17/16  7:49 PM  Result Value Ref Range   Glucose-Capillary 203 (H) 65 - 99 mg/dL  Glucose, capillary     Status: Abnormal   Collection Time: 10/17/16 11:07 PM  Result Value Ref Range   Glucose-Capillary 117 (H) 65 - 99 mg/dL  CBC     Status: Abnormal   Collection Time: 10/18/16  4:58 AM  Result Value Ref Range   WBC 7.6 4.0 - 10.5 K/uL   RBC 3.92 3.87 - 5.11 MIL/uL   Hemoglobin 9.0 (L) 12.0 - 15.0 g/dL   HCT 29.9 (L) 36.0 - 46.0 %   MCV 76.3 (L) 78.0 - 100.0 fL   MCH 23.0 (L) 26.0 - 34.0 pg   MCHC 30.1 30.0 - 36.0 g/dL   RDW 14.9 11.5 - 15.5 %   Platelets 213 150 - 400 K/uL  Basic metabolic panel     Status: Abnormal   Collection Time: 10/18/16  4:58 AM  Result Value Ref Range   Sodium 138 135 - 145 mmol/L   Potassium 3.8 3.5 - 5.1 mmol/L   Chloride 106 101 - 111 mmol/L   CO2 21 (L) 22 - 32 mmol/L   Glucose, Bld 238 (H) 65 - 99 mg/dL   BUN 11 6 - 20 mg/dL   Creatinine, Ser 0.63 0.44 - 1.00 mg/dL   Calcium 8.0 (L) 8.9 - 10.3 mg/dL   GFR calc non Af Amer >60 >60 mL/min   GFR calc Af Amer >60 >60 mL/min    Comment: (NOTE) The eGFR has been calculated using the CKD EPI equation. This calculation has not been validated in all clinical situations. eGFR's persistently <60 mL/min signify possible Chronic Kidney Disease.    Anion gap 11 5 - 15  Glucose, capillary     Status: Abnormal   Collection Time: 10/18/16  5:41 AM  Result Value Ref Range   Glucose-Capillary 215 (H) 65 - 99 mg/dL  Glucose, capillary     Status: Abnormal   Collection Time: 10/18/16  7:22 AM  Result Value Ref Range   Glucose-Capillary 170 (H) 65 - 99 mg/dL  Blood gas, arterial     Status: Abnormal   Collection Time: 10/18/16  8:50 AM  Result Value Ref Range   FIO2 21.00    pH, Arterial 7.33 (L) 7.350 - 7.450   pCO2 arterial 46.9 32.0 - 48.0 mmHg   pO2, Arterial 53.4 (L) 83.0 - 108.0 mmHg    Bicarbonate 23.4 20.0 - 28.0 mmol/L   Acid-base deficit  0.8 0.0 - 2.0 mmol/L   O2 Saturation 86.8 %   Patient temperature 36.7    Collection site LEFT RADIAL    Drawn by [id number]    Sample type ARTERIAL DRAW    Allens test (pass/fail) PASS PASS  Glucose, capillary     Status: Abnormal   Collection Time: 10/18/16 11:13 AM  Result Value Ref Range   Glucose-Capillary 101 (H) 65 - 99 mg/dL  Glucose, capillary     Status: Abnormal   Collection Time: 10/18/16  4:06 PM  Result Value Ref Range   Glucose-Capillary 183 (H) 65 - 99 mg/dL  Glucose, capillary     Status: Abnormal   Collection Time: 10/18/16  8:14 PM  Result Value Ref Range   Glucose-Capillary 153 (H) 65 - 99 mg/dL   Comment 1 Notify RN   Glucose, capillary     Status: Abnormal   Collection Time: 10/18/16 11:56 PM  Result Value Ref Range   Glucose-Capillary 147 (H) 65 - 99 mg/dL   Comment 1 Notify RN   Glucose, capillary     Status: Abnormal   Collection Time: 10/19/16  3:56 AM  Result Value Ref Range   Glucose-Capillary 222 (H) 65 - 99 mg/dL   Comment 1 Notify RN    *Note: Due to a large number of results and/or encounters for the requested time period, some results have not been displayed. A complete set of results can be found in Results Review.    ABGS  Recent Labs  10/18/16 0850  PHART 7.33*  PO2ART 53.4*  HCO3 23.4   CULTURES Recent Results (from the past 240 hour(s))  Blood Culture (routine x 2)     Status: None (Preliminary result)   Collection Time: 10/15/16  4:35 PM  Result Value Ref Range Status   Specimen Description BLOOD LEFT HAND  Final   Special Requests   Final    Blood Culture results may not be optimal due to an inadequate volume of blood received in culture bottles   Culture NO GROWTH 3 DAYS  Final   Report Status PENDING  Incomplete  Blood Culture (routine x 2)     Status: None (Preliminary result)   Collection Time: 10/15/16  4:45 PM  Result Value Ref Range Status   Specimen Description  BLOOD LEFT WRIST  Final   Special Requests   Final    Blood Culture results may not be optimal due to an inadequate volume of blood received in culture bottles   Culture NO GROWTH 3 DAYS  Final   Report Status PENDING  Incomplete  Urine Culture     Status: None   Collection Time: 10/15/16  5:00 PM  Result Value Ref Range Status   Specimen Description URINE, CATHETERIZED  Final   Special Requests NONE  Final   Culture   Final    NO GROWTH Performed at [facility], [address], [place], [place] [address]    Report Status 10/17/2016 FINAL  Final  MRSA PCR Screening     Status: None   Collection Time: 10/15/16  8:08 PM  Result Value Ref Range Status   MRSA by PCR NEGATIVE NEGATIVE Final    Comment:        The GeneXpert MRSA Assay (FDA approved for NASAL specimens only), is one component of a comprehensive MRSA colonization surveillance program. It is not intended to diagnose MRSA infection nor to guide or monitor treatment for MRSA infections.   Culture, respiratory (NON-Expectorated)  Status: None   Collection Time: 10/15/16  9:00 PM  Result Value Ref Range Status   Specimen Description SPUTUM  Final   Special Requests NONE  Final   Gram Stain   Final    MODERATE WBC PRESENT,BOTH PMN AND MONONUCLEAR NO SQUAMOUS EPITHELIAL CELLS SEEN NO ORGANISMS SEEN    Culture   Final    NO GROWTH 2 DAYS Performed at [facility], [address], [place], [place] [address]    Report Status 10/18/2016 FINAL  Final   Studies/Results: Dg Chest Port 1 View  Result Date: 10/17/2016 CLINICAL DATA:  Acute respiratory failure.  COPD EXAM: PORTABLE CHEST 1 VIEW COMPARISON:  10/16/2016 FINDINGS: Endotracheal to tube 6.5 cm from carina. Tube projects over the thoracic inlet. NG tube in stomach. Extremely low lung volumes. Bibasilar atelectasis. No pulmonary edema. IMPRESSION: 1. Endotracheal tube appears high; however, this may be positional. 2. Low lung volumes and basilar  atelectasis not changed. Electronically Signed   By: Genevive Bi M.D.   On: 10/17/2016 09:04    Medications:  Prior to Admission:  Prescriptions Prior to Admission  Medication Sig Dispense Refill Last Dose  . amLODipine (NORVASC) 5 MG tablet TAKE 2 TABLETS BY MOUTH ONCE DAILY. 60 tablet 0 unknown at Unknown time  . diclofenac sodium (VOLTAREN) 1 % GEL Apply 2 g topically daily as needed (Pain). 100 g 2 unknown at Unknown time  . dicyclomine (BENTYL) 10 MG capsule Take 10 mg by mouth 4 (four) times daily -  before meals and at bedtime.   unknown at Unknown time  . hydroxychloroquine (PLAQUENIL) 200 MG tablet Take 200 mg by mouth 2 (two) times daily.    unknown at Unknown time  . lamoTRIgine (LAMICTAL) 100 MG tablet Take 1 tablet (100 mg total) by mouth 2 (two) times daily. 60 tablet 5 unknown at Unknown time  . LORazepam (ATIVAN) 0.5 MG tablet Take 1 tablet (0.5 mg total) by mouth 3 (three) times daily. 90 tablet 2 unknown at Unknown time  . losartan (COZAAR) 50 MG tablet TAKE ONE TABLET BY MOUTH DAILY. 90 tablet 1 unknown at Unknown time  . ondansetron (ZOFRAN ODT) 8 MG disintegrating tablet Take 1 tablet (8 mg total) by mouth every 8 (eight) hours as needed for nausea or vomiting. 10 tablet 0 unknown at Unknown time  . pregabalin (LYRICA) 75 MG capsule Take 75 mg by mouth 2 (two) times daily.    unknown at Unknown time  . RABEprazole (ACIPHEX) 20 MG tablet Take 1 tablet (20 mg total) by mouth 2 (two) times daily. 180 tablet 1 unknown at Unknown time  . RESTASIS 0.05 % ophthalmic emulsion Place 1 drop into both eyes 2 (two) times daily.    unknown at Unknown time  . risperiDONE (RISPERDAL) 0.5 MG tablet Take 1 tablet (0.5 mg total) by mouth at bedtime. 90 tablet 2 unknown at Unknown time  . sertraline (ZOLOFT) 100 MG tablet Take 2 tablets (200 mg total) by mouth daily. 180 tablet 2 unknown at Unknown time  . traZODone (DESYREL) 100 MG tablet Take 2 tablets (200 mg total) by mouth at bedtime.  180 tablet 2 unknown at Unknown time  . ACCU-CHEK FASTCLIX LANCETS MISC Use to test blood sugar 4 times daily. Dx: E10.65 408 each 1 Taking  . ACCU-CHEK SMARTVIEW test strip TEST BLOOD SUGAR FOUR TIMES DAILY AS DIRECTED 350 each 2 Taking  . albuterol (PROVENTIL HFA;VENTOLIN HFA) 108 (90 Base) MCG/ACT inhaler Inhale 1-2 puffs every  6 hours as needed for wheezing, shortness of breath 3 Inhaler 3 Taking  . Alcohol Swabs (B-D SINGLE USE SWABS REGULAR) PADS Use for injections and glucose testing 8 times daily. Dx: E10.65 900 each 1 Taking  . aspirin EC 81 MG tablet Take 81 mg by mouth daily.   Taking  . Azelastine-Fluticasone (DYMISTA) 137-50 MCG/ACT SUSP Place 1 puff into the nose at bedtime. 1 Bottle 0 Taking  . BD PEN NEEDLE NANO U/F 32G X 4 MM MISC USE FOUR TIMES DAILY 360 each 3 Taking  . Blood Glucose Calibration (ACCU-CHEK SMARTVIEW CONTROL) LIQD 1 each by Other route as needed. 1 each 2 Taking  . Blood Glucose Monitoring Suppl (ACCU-CHEK NANO SMARTVIEW) W/DEVICE KIT 1 each by Does not apply route daily. Dx: E10.65 (Patient not taking: Reported on 09/27/2016) 1 kit 0 Not Taking  . Cholecalciferol (VITAMIN D) 2000 units CAPS Take 1 capsule by mouth daily.   Taking  . clobetasol cream (TEMOVATE) 2.62 % Apply 1 application topically daily.   Taking  . cloNIDine (CATAPRES) 0.3 MG tablet TAKE 1 TABLET BY MOUTH EVERY EIGHT HOURS. ONCE AT 8AM, 4PM, AND 12 MIDNIGHT. 270 tablet 0 Taking  . Coenzyme Q-10 100 MG capsule Take 1 capsule (100 mg total) by mouth 3 (three) times daily. 90 capsule 5 Taking  . Insulin Glargine (TOUJEO SOLOSTAR) 300 UNIT/ML SOPN Inject 25 Units into the skin at bedtime. 6 pen 3 Taking  . levothyroxine (SYNTHROID, LEVOTHROID) 50 MCG tablet TAKE 1 TABLET BY MOUTH ONCE DAILY AND 1/2 TABLET ON SUNDAYS. 30 tablet 0 Taking  . metoprolol tartrate (LOPRESSOR) 50 MG tablet Take 1 tablet (50 mg total) by mouth 2 (two) times daily. Please call and schedule appointment 60 tablet 0 Taking  .  montelukast (SINGULAIR) 10 MG tablet TAKE ONE TABLET BY MOUTH ONCE DAILY. 90 tablet 0 Taking  . Multiple Vitamins-Minerals (ONE-A-DAY 50 PLUS PO) Take 1 tablet by mouth daily.   Taking  . NOVOLOG FLEXPEN 100 UNIT/ML FlexPen INJECT 16 TO 30 UNITS INTO THE SKIN 3 (THREE) TIMES DAILY WITH MEALS. 90 mL 2 Taking  . nystatin (MYCOSTATIN/NYSTOP) powder APPLY TO AFFECTED AREA FOUR TIMES DAILY. 30 g 2 Taking  . Riboflavin 400 MG TABS Take 400 mg by mouth daily. (Patient not taking: Reported on 09/27/2016) 30 tablet 5 Not Taking  . rosuvastatin (CRESTOR) 5 MG tablet TAKE 1 TABLET BY MOUTH AT BEDTIME. 90 tablet 0 Taking  . vitamin B-12 (CYANOCOBALAMIN) 1000 MCG tablet Take 1,000 mcg by mouth daily.   Taking   Scheduled: . amLODipine  5 mg Oral Daily  . arformoterol  15 mcg Nebulization BID  . budesonide (PULMICORT) nebulizer solution  0.5 mg Nebulization BID  . chlorhexidine gluconate (MEDLINE KIT)  15 mL Mouth Rinse BID  . cloNIDine  0.3 mg Per Tube TID  . enoxaparin (LOVENOX) injection  40 mg Subcutaneous Q24H  . insulin aspart  0-9 Units Subcutaneous Q4H  . insulin glargine  12 Units Subcutaneous QHS  . ipratropium-albuterol  3 mL Nebulization Q6H  . lamoTRIgine  100 mg Oral BID  . levothyroxine  25 mcg Intravenous QAC breakfast  . mouth rinse  15 mL Mouth Rinse QID  . methylPREDNISolone (SOLU-MEDROL) injection  60 mg Intravenous Q12H  . metoprolol tartrate  5 mg Intravenous Q8H  . pantoprazole (PROTONIX) IV  40 mg Intravenous Q12H  . risperiDONE  0.5 mg Oral QHS  . sertraline  50 mg Oral BID   Continuous: . sodium chloride 1,000  mL (10/18/16 1700)  . aztreonam Stopped (10/18/16 2351)  . fentaNYL infusion INTRAVENOUS Stopped (10/18/16 0620)  . levofloxacin (LEVAQUIN) IV Stopped (10/18/16 1934)   SHU:[id number], LORazepam, ondansetron **OR** ondansetron (ZOFRAN) IV  Assesment: She was admitted with acute hypercapnic respiratory failure requiring ventilator support. She has  healthcare associated pneumonia likely from aspiration and is being treated for that. She has obstructive sleep apnea at baseline. She has history of a stroke. Her baseline functional status is not known. She has a history of bipolar disease listed. She is listed as having obstructive sleep apnea so she may need CPAP or BiPAP at night. Principal Problem:   Acute respiratory failure with hypercapnia (HCC) Active Problems:   Hyperlipidemia   GERD   Obstructive sleep apnea   Constipation   Polypharmacy   Bipolar disorder (HCC)   Hemiplegia and hemiparesis following cerebral infarction affecting unspecified side (HCC)   Seizure disorder (HCC)   Gastroparesis   Hypothyroidism   COPD (chronic obstructive pulmonary disease) with chronic bronchitis (HCC)   Microcytic anemia   Pulmonary hypertension (HCC)   Poorly controlled type 2 diabetes mellitus with circulatory disorder (HCC)   Labile hypertension   Class 1 obesity with serious comorbidity and body mass index (BMI) of 33.0 to 33.9 in adult   Diabetic polyneuropathy associated with type 2 diabetes mellitus ([place])   Acute metabolic encephalopathy   AKI (acute kidney injury) ([place])   HCAP (healthcare-associated pneumonia)/Aspiration PNA    Plan: Continue current treatments. Continue speech evaluation. No other new medications now. She seems to be improving as far as her breathing and pneumonia is concerned. Current antibiotics seem to be helping. If antibiotic coverage is narrowed probably Flagyl would be appropriate for the presumed aspiration    LOS: 4 days   Bernabe Dorce L 10/19/2016, 7:20 AM

## 2016-10-19 NOTE — Progress Notes (Signed)
Pharmacy Antibiotic Note  [name] is a 66 y.o. female admitted on 10/15/2016 with sepsis.  Pharmacy has been consulted for Levaquin dosing.  Plan: Cont Levaquin to 750 mg IV q24 hours F/u renal function, cultures and clinical coure  Height: 5' (152.4 cm) Weight: 154 lb 1.6 oz (69.9 kg) IBW/kg (Calculated) : 45.5  Temp (24hrs), Avg:98.9 F (37.2 C), Min:98.4 F (36.9 C), Max:99.7 F (37.6 C)   Recent Labs Lab 10/15/16 1604 10/15/16 1605 10/15/16 1652 10/15/16 1917 10/15/16 2030 10/16/16 0332 10/17/16 0558 10/18/16 0458  WBC  --   --  6.7  --   --  5.3 9.6 7.6  CREATININE  --  1.90* 1.91*  --   --  0.93 0.65 0.63  LATICACIDVEN 3.14*  --   --  1.38 1.7 2.5*  --   --     Estimated Creatinine Clearance: 60.4 mL/min (by C-G formula based on SCr of 0.63 mg/dL).    Allergies  Allergen Reactions  . Cephalexin Hives  . Iron Nausea And Vomiting  . Milk-Related Compounds Other (See Comments)    Doesn't agree with stomach.   . Penicillins Hives    Has patient had a PCN reaction causing immediate rash, facial/tongue/throat swelling, SOB or lightheadedness with hypotension: Yes Has patient had a PCN reaction causing severe rash involving mucus membranes or skin necrosis: No Has patient had a PCN reaction that required hospitalization No Has patient had a PCN reaction occurring within the last 10 years: No If all of the above answers are "NO", then may proceed with Cephalosporin use.   [name] Phenazopyridine Hcl Hives         Antimicrobials this admission:  Vanc 8/5 >> 8/6 Levaquin 8/5 >>  Aztreonam 8/5 >>8/9  Dose adjustments this admission:  n/a   Microbiology results:  8/5 BCx: ngtd 8/5 UCx:  Ng final 8/5 Sputum:  ngtd 8/5 MRSA PCR: (-)   Thank you for allowing pharmacy to be a part of this patient's care.  Excell Seltzer P[place]et 10/19/2016 11:08 AM

## 2016-10-20 ENCOUNTER — Ambulatory Visit: Payer: Self-pay | Admitting: Gastroenterology

## 2016-10-20 DIAGNOSIS — J9601 Acute respiratory failure with hypoxia: Secondary | ICD-10-CM

## 2016-10-20 DIAGNOSIS — A419 Sepsis, unspecified organism: Secondary | ICD-10-CM

## 2016-10-20 LAB — IRON AND TIBC
Iron: 34 ug/dL (ref 28–170)
Saturation Ratios: 11 % (ref 10.4–31.8)
TIBC: 315 ug/dL (ref 250–450)
UIBC: 281 ug/dL

## 2016-10-20 LAB — CULTURE, BLOOD (ROUTINE X 2)
CULTURE: NO GROWTH
CULTURE: NO GROWTH

## 2016-10-20 LAB — CBC
HCT: 32.9 % — ABNORMAL LOW (ref 36.0–46.0)
Hemoglobin: 10.2 g/dL — ABNORMAL LOW (ref 12.0–15.0)
MCH: 22.8 pg — AB (ref 26.0–34.0)
MCHC: 31 g/dL (ref 30.0–36.0)
MCV: 73.6 fL — ABNORMAL LOW (ref 78.0–100.0)
PLATELETS: 244 10*3/uL (ref 150–400)
RBC: 4.47 MIL/uL (ref 3.87–5.11)
RDW: 14.2 % (ref 11.5–15.5)
WBC: 6.3 10*3/uL (ref 4.0–10.5)

## 2016-10-20 LAB — BASIC METABOLIC PANEL
Anion gap: 7 (ref 5–15)
BUN: 15 mg/dL (ref 6–20)
CO2: 27 mmol/L (ref 22–32)
CREATININE: 0.73 mg/dL (ref 0.44–1.00)
Calcium: 8.6 mg/dL — ABNORMAL LOW (ref 8.9–10.3)
Chloride: 103 mmol/L (ref 101–111)
GFR calc Af Amer: >60 mL/min (ref >60)
Glucose, Bld: 222 mg/dL — ABNORMAL HIGH (ref 65–99)
POTASSIUM: 3.7 mmol/L (ref 3.5–5.1)
Sodium: 137 mmol/L (ref 135–145)

## 2016-10-20 LAB — GLUCOSE, CAPILLARY
GLUCOSE-CAPILLARY: 181 mg/dL — AB (ref 65–99)
Glucose-Capillary: 256 mg/dL — ABNORMAL HIGH (ref 65–99)
Glucose-Capillary: 242 mg/dL — ABNORMAL HIGH (ref 65–99)

## 2016-10-20 LAB — VITAMIN B12: VITAMIN B 12: 1366 pg/mL — AB (ref 180–914)

## 2016-10-20 MED ORDER — PREDNISONE 20 MG PO TABS: 20.0000 mg | ORAL_TABLET | Freq: Every day | ORAL | 0 refills | Status: DC

## 2016-10-20 MED ORDER — SERTRALINE HCL 100 MG PO TABS: 100.0000 mg | ORAL_TABLET | Freq: Every day | ORAL | Status: DC

## 2016-10-20 MED ORDER — LEVOFLOXACIN 750 MG PO TABS: 750.0000 mg | ORAL_TABLET | Freq: Every day | ORAL | Status: DC

## 2016-10-20 MED ORDER — LORAZEPAM 0.5 MG PO TABS: 0.2500 mg | ORAL_TABLET | Freq: Three times a day (TID) | ORAL | Status: DC

## 2016-10-20 MED ORDER — PREGABALIN 75 MG PO CAPS: 75.0000 mg | ORAL_CAPSULE | Freq: Every day | ORAL | Status: DC

## 2016-10-20 MED ORDER — LEVOFLOXACIN 750 MG PO TABS: 750.0000 mg | ORAL_TABLET | Freq: Every day | ORAL | 0 refills | Status: AC

## 2016-10-20 MED ORDER — TRAZODONE HCL 100 MG PO TABS: 100.0000 mg | ORAL_TABLET | Freq: Every day | ORAL | Status: DC

## 2016-10-20 NOTE — Care Management Important Message (Signed)
Important Message  Patient Details  Name: [name] MRN: [id number] Date of Birth: 01/30/1951   Medicare Important Message Given:  Yes    Sherald Barge, RN 10/20/2016, 2:04 PM

## 2016-10-20 NOTE — Progress Notes (Addendum)
Inpatient Diabetes Program Recommendations  AACE/ADA: New Consensus Statement on Inpatient Glycemic Control (2015)  Target Ranges:  Prepandial:   less than 140 mg/dL      Peak postprandial:   less than 180 mg/dL (1-2 hours)      Critically ill patients:  140 - 180 mg/dL  Results for [name], [name] (MRN [id number]) as of 10/20/2016 13:35  Ref. Range 10/19/2016 03:56 10/19/2016 07:35 10/19/2016 11:20 10/19/2016 16:21 10/19/2016 21:31 10/20/2016 04:52 10/20/2016 07:42 10/20/2016 11:19  Glucose-Capillary Latest Ref Range: 65 - 99 mg/dL 222 (H) 186 (H) 163 (H) 148 (H) 201 (H) 256 (H) 242 (H) 181 (H)    Review of Glycemic Control  Current orders for Inpatient glycemic control: Lantus 12 units QHS, Novolog 0-9 units Q4H  Inpatient Diabetes Program Recommendations: Insulin - Basal: Please consider increasing Lantus to 17 units QHS.   Thanks, Barnie Alderman, RN, MSN, CDE Diabetes Coordinator Inpatient Diabetes Program [phone] (Team Pager from 8am to 5pm)

## 2016-10-20 NOTE — Discharge Summary (Signed)
Physician Discharge Summary  Davetta Olliff Ernster HBZ:[id number] DOB: 17-Feb-1951 DOA: 10/15/2016  PCP: [name], MD  Admit date: 10/15/2016 Discharge date: 10/20/2016  Time spent: Greater than 30 minutes  Recommendations for Outpatient Follow-up:  1. The doses of some of her psychotropic medications were decreased including Zoloft, lorazepam, Lyrica, and trazodone. 2. Home health services were resumed at the time of discharge.   Discharge Diagnoses:  Principal Problem:   Acute respiratory failure with hypercapnia (HCC) Active Problems:   Acute metabolic encephalopathy   Pulmonary hypertension (HCC)   HCAP (healthcare-associated pneumonia)/Aspiration PNA   Hyperlipidemia   GERD   Obstructive sleep apnea   Oropharyngeal dysphagia   Constipation   Polypharmacy   Bipolar disorder (HCC)   Hemiplegia and hemiparesis following cerebral infarction affecting unspecified side ([place])   Seizure disorder (HCC)   Gastroparesis   Hypothyroidism   COPD (chronic obstructive pulmonary disease) with chronic bronchitis (HCC)   Microcytic anemia   Poorly controlled type 2 diabetes mellitus with circulatory disorder (HCC)   Labile hypertension   Class 1 obesity with serious comorbidity and body mass index (BMI) of 33.0 to 33.9 in adult   Diabetic polyneuropathy associated with type 2 diabetes mellitus ([place])   AKI (acute kidney injury) ([place])   Discharge Condition: Improved.  Diet recommendation: Heart healthy/carbohydrate modified.  Filed Weights   10/18/16 0500 10/19/16 0500 10/20/16 0500  Weight: 72.2 kg (159 lb 2.8 oz) 69.9 kg (154 lb 1.6 oz) 68.7 kg (151 lb 6.4 oz)    History of present illness:  Patient is a 66 y.o.femalewith medical history significant of COPD, OSA, CVA with unclear hemiplegia per chart, IDDM, OSA on CPAP, bipolar disorder and polypharmacy. She was apparently dropped off in the ED waiting area by family members evaluation of altered mental status. Pt was found to be  sleepy and hypotensive and was intubated in the ED for airway protection. No history could be obtained from patient and no family was present. In the ED, she was initially hypotensive with a blood pressure of 87/52. Following IV fluid bolus, her blood pressure improved to 157/67. She was afebrile. Labs notable for lithium level less than 0.06, ammonia level 44, lactic acid of 3.14, glucose 233, creatinine 1.91, normal WBC, hemoglobin of 10.2, UA negative for nitrite and leukocytes, but with many bacteria. Noncontrasted head CT revealed status post bifrontal craniotomy with no evidence for acute injury. Chest x-ray revealed low inspiration on the right mid and lower lung zone, atelectasis/scarring. She was admitted for further evaluation and management.  Hospital Course:  1. Acute metabolic encephalopathy, secondary to pneumonia causing hypercapnia. Patient's head CT on admission revealed chronic stable changes. She was intubated due to airway protection initially, but was found to have an initial ABG of pH 7.24, PCO2 of 55, and PO2 of 385 following intubation. Her chest x-ray initially revealed right lower lobe atelectasis, but the follow-up chest x-ray revealed findings more consistent with pneumonia. Her ammonia level was only slightly elevated. Lactulose was given 2 empirically, but hepatic encephalopathy was not likely. -Her TSH was within normal limits. HIV was nonreactive. RPR was negative. -With treatment of her pneumonia, respiratory failure, and metabolic derangements, her acute encephalopathy completely resolved. -The etiology was likely multifactorial, but infection and CO2 narcosis are the most likely etiologies. Also consider a contribution from polypharmacy and her psychotropic medications.  Acute respiratory failure with hypercapnia and hypoxia. Patient has a history of COPD and obstructive sleep apnea. -She was intubated in the ED for  airway protection. Follow-up ABG revealed a pH of  7.24, PCO2 of 55, and PO2 of 385 following intubation. -Pulmonologist, Dr. Luan Pulling was consulted and assisted with ventilator management. -Patient self extubated on the morning of 10/28/16. -Follow-up ABG revealed improvement with resolution of hypercapnia, but with hypoxia with a PO2 of 53. -Oxygen was titrated to keep her oxygen saturation greater than 90%.  -CPAP was restarted nightly for OSA. -Likely etiology is COPD exacerbation and Aspiration pneumonia/HCAP.  Healthcare associated pneumonia/ aspiration pneumonia with COPD exacerbation. As stated above, the patient was intubated for airway protection and the follow-up ABG was mentioned above. -Follow-up chest x-ray on 10/16/16 was consistent with pneumonia of the right lower lobe which was likely associated with aspiration. -Blood cultures ordered and have remained negative to date. Sputum culture ordered and has been negative to date. MRSA  by PCR was negative. -Patient was started on broad-spectrum antibiotics with aztreonam, Levaquin, and vancomycin. Vancomycin was discontinued when MRSA screen was negative. -IV Solu-Medrol and bronchodilators were started for COPD exacerbation. -Antibiotic therapy was narrowed to Levaquin alone. Solu-Medrol was discontinued in favor of a prednisone taper. -Patient improved clinically and symptomatically. She was discharged on Levaquin and prednisone for few more days. She was instructed to resume her chronic bronchodilators. -She was oxygenating in the 90s on room air at the time of discharge.  Acute kidney injury, secondary to prerenal azotemia. Patient's creatinine was 1.91 on admission. Baseline creatinine from 09/2016 was within normal limits. Patient was started on IV fluids for hydration. Losartan was held. -Her creatinine normalized.  Bipolar disorder with polypharmacy. It was initially unclear what the patient's home regimen is. Her lithium level was found to be low, but she was is now on  lithium. Previous medication list noted that she was on Risperdal 0.5 mg daily at bedtime, sertraline 200 mg daily, trazodone 200 mg at bedtime and lorazepam 0.5 mg 3 times a day. -Due to polypharmacy and perhaps oversedation in the setting of infection on admission, it was felt that decreasing the doses of some of her psychotropic medications would be a reasonable management decision. -Sertraline, Lyrica, trazodone, and lorazepam dosing were decreased (but not stopped). -Further management will be deferred to her PCP and specialist.  Hypertension.  Patient's home medication regimen include metoprolol 50 mg twice a day, Norvasc 5 mg daily, clonidine 0.3 mg every 8 hours, and losartan 50 mg daily. -She was started on Catapres patch and IV metoprolol while intubated. -All of her antihypertensive medications were eventually restarted at titrating doses. -She was discharged on her home regimen. Her renal function normalized, so losartan was restarted.  Diabetes mellitus, insulin-dependent. Patient per previous medication list is treated chronically with NovoLog 16-30 units 3 times daily with meals and Lantus 25 units daily at bedtime. Her A1c was 6.6 on 07/07/16. -On IV steroid treatment, her CBGs increased. Sliding scale NovoLog and Lantus were started and titrated accordingly. -Her hemoglobin A1c was 6.3 during the hospitalization. -She was discharged on her home regimen.  Hypothyroidism. Patient is treated with Synthroid 50 g daily. Her TSH was within normal limits. Synthroid was continued.  Seizure disorder. Patient is treated chronically with Lamictal 100 mg twice a day. It was restarted.  Microcytic anemia. Patient has chronic microcytic anemia. Her hemoglobin was more or less stable ranging between 9.2-10.2. -Anemia studies revealed a normal total iron of 34, normal TIBC of 315, elevated vitamin B12 of 1366.  Oropharyngeal dysphagia. Speech therapy consulted due to possibility of  aspiration pneumonia. -Initial  assessment revealed oropharyngeal dysphagia. When the patient became more alert, and follow-up evaluation revealed no significant signs of dysphagia. Due to the fact that she did not have teeth, a soft chopped diet was given.    Procedures:  Self extubation 10/18/16  EEG 10/17/16-normal according.  Intubation in the ED on 10/15/16.  Consultations:  Pulmonologist, Dr. Luan Pulling  Discharge Exam: Vitals:   10/20/16 0750 10/20/16 0840  BP:    Pulse:  68  Resp:    Temp:    SpO2: 98% 97%  Temperature 98.9. Pulse 68. Respiratory rate 18. Blood pressure 157/46. [place] saturation 99%.  General: Patient looks much better, she is alert and oriented and talkative. Cardiovascular: S1, S2, with a soft systolic murmur. Respiratory: Clear anteriorly with decreased breath sounds in bases. Breathing nonlabored. Neuro: She is alert and oriented 3.  Discharge Instructions   Discharge Instructions    Diet - low sodium heart healthy    Complete by:  As directed    Discharge instructions    Complete by:  As directed    Some of your medications have changed due to oversedation that contributed to your hospitalization. Take medications as directed and prescribed.   Increase activity slowly    Complete by:  As directed      Current Discharge Medication List    START taking these medications   Details  levofloxacin (LEVAQUIN) 750 MG tablet Take 1 tablet (750 mg total) by mouth daily. Antibiotic Qty: 3 tablet, Refills: 0    predniSONE (DELTASONE) 20 MG tablet Take 1 tablet (20 mg total) by mouth daily with breakfast. Take for 5 more days and then stop. Qty: 5 tablet, Refills: 0      CONTINUE these medications which have CHANGED   Details  LORazepam (ATIVAN) 0.5 MG tablet Take 0.5 tablets (0.25 mg total) by mouth 3 (three) times daily.    pregabalin (LYRICA) 75 MG capsule Take 1 capsule (75 mg total) by mouth daily.    sertraline (ZOLOFT) 100 MG tablet Take 1  tablet (100 mg total) by mouth daily.    traZODone (DESYREL) 100 MG tablet Take 1 tablet (100 mg total) by mouth at bedtime.      CONTINUE these medications which have NOT CHANGED   Details  albuterol (PROVENTIL HFA;VENTOLIN HFA) 108 (90 Base) MCG/ACT inhaler Inhale 1-2 puffs every 6 hours as needed for wheezing, shortness of breath Qty: 3 Inhaler, Refills: 3    amLODipine (NORVASC) 5 MG tablet TAKE 2 TABLETS BY MOUTH ONCE DAILY. Qty: 60 tablet, Refills: 0    aspirin EC 81 MG tablet Take 81 mg by mouth daily.    Cholecalciferol (VITAMIN D) 2000 units CAPS Take 1 capsule by mouth daily.    cloNIDine (CATAPRES) 0.3 MG tablet TAKE 1 TABLET BY MOUTH EVERY EIGHT HOURS. ONCE AT 8AM, 4PM, AND 12 MIDNIGHT. Qty: 270 tablet, Refills: 0    diclofenac sodium (VOLTAREN) 1 % GEL Apply 2 g topically daily as needed (Pain). Qty: 100 g, Refills: 2    dicyclomine (BENTYL) 10 MG capsule Take 10 mg by mouth 4 (four) times daily -  before meals and at bedtime.    docusate sodium (COLACE) 100 MG capsule Take 200 mg by mouth at bedtime.    fluticasone (FLONASE) 50 MCG/ACT nasal spray Place 2 sprays into both nostrils daily.    hydroxychloroquine (PLAQUENIL) 200 MG tablet Take 200 mg by mouth 2 (two) times daily.     Insulin Glargine (TOUJEO SOLOSTAR) 300 UNIT/ML SOPN Inject  25 Units into the skin at bedtime. Qty: 6 pen, Refills: 3    lamoTRIgine (LAMICTAL) 100 MG tablet Take 1 tablet (100 mg total) by mouth 2 (two) times daily. Qty: 60 tablet, Refills: 5    levothyroxine (SYNTHROID, LEVOTHROID) 50 MCG tablet TAKE 1 TABLET BY MOUTH ONCE DAILY AND 1/2 TABLET ON SUNDAYS. Qty: 30 tablet, Refills: 0    losartan (COZAAR) 50 MG tablet TAKE ONE TABLET BY MOUTH DAILY. Qty: 90 tablet, Refills: 1    montelukast (SINGULAIR) 10 MG tablet TAKE ONE TABLET BY MOUTH ONCE DAILY. Qty: 90 tablet, Refills: 0    Multiple Vitamins-Minerals (ONE-A-DAY 50 PLUS PO) Take 1 tablet by mouth daily.    NOVOLOG FLEXPEN 100  UNIT/ML FlexPen INJECT 16 TO 30 UNITS INTO THE SKIN 3 (THREE) TIMES DAILY WITH MEALS. Qty: 90 mL, Refills: 2    ondansetron (ZOFRAN ODT) 8 MG disintegrating tablet Take 1 tablet (8 mg total) by mouth every 8 (eight) hours as needed for nausea or vomiting. Qty: 10 tablet, Refills: 0    RABEprazole (ACIPHEX) 20 MG tablet Take 1 tablet (20 mg total) by mouth 2 (two) times daily. Qty: 180 tablet, Refills: 1    RESTASIS 0.05 % ophthalmic emulsion Place 1 drop into both eyes 2 (two) times daily.     risperiDONE (RISPERDAL) 0.5 MG tablet Take 1 tablet (0.5 mg total) by mouth at bedtime. Qty: 90 tablet, Refills: 2    rosuvastatin (CRESTOR) 5 MG tablet TAKE 1 TABLET BY MOUTH AT BEDTIME. Qty: 90 tablet, Refills: 0    metoprolol tartrate (LOPRESSOR) 50 MG tablet Take 1 tablet (50 mg total) by mouth 2 (two) times daily. Please call and schedule appointment Qty: 60 tablet, Refills: 0       Allergies  Allergen Reactions  . Cephalexin Hives  . Iron Nausea And Vomiting  . Milk-Related Compounds Other (See Comments)    Doesn't agree with stomach.   . Penicillins Hives    Has patient had a PCN reaction causing immediate rash, facial/tongue/throat swelling, SOB or lightheadedness with hypotension: Yes Has patient had a PCN reaction causing severe rash involving mucus membranes or skin necrosis: No Has patient had a PCN reaction that required hospitalization No Has patient had a PCN reaction occurring within the last 10 years: No If all of the above answers are "NO", then may proceed with Cephalosporin use.   [name] Phenazopyridine Hcl Hives         Follow-up Information    [name], MD On 10/23/2016.   Specialty:  Family Medicine Why:  1:20pm Contact information: [address] [place] [address] [phone]            The results of significant diagnostics from this hospitalization (including imaging, microbiology, ancillary and laboratory) are listed below for  reference.    Significant Diagnostic Studies: Dg Abd 1 View  Result Date: 10/16/2016 CLINICAL DATA:  Abdominal pain. EXAM: ABDOMEN - 1 VIEW COMPARISON:  10/10/2016 FINDINGS: Cholecystectomy clips are identified within the right upper quadrant. There is a nasogastric tube with tip in the distal stomach. The bowel gas pattern is nonobstructed. No dilated loops of small or large bowel. IMPRESSION: 1. Nonobstructive bowel gas pattern. Electronically Signed   By: Kerby Moors M.D.   On: 10/16/2016 15:15   Ct Head Wo Contrast  Result Date: 10/15/2016 CLINICAL DATA:  Pt c/o upper back pain after fall 3 days ago. Pt denies LOC upon fall. Pt's BP in triage 87/52.  Pt falling asleep in triage, but easily arouses. Pt reports she doesn't know why she's so sleepy. Denies dizziness, weakness. EXAM: CT HEAD WITHOUT CONTRAST TECHNIQUE: Contiguous axial images were obtained from the base of the skull through the vertex without intravenous contrast. COMPARISON:  02/17/2016, 05/01/2015 FINDINGS: Brain: There is encephalomalacia and postsurgical change involving the frontal lobes bilaterally. 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 Minimal soft tissue is identified within this same location, less apparent without intravenous contrast. There is no significant mass effect or edema in this region. No new mass or evidence for acute infarction. There is mild central and cortical atrophy. Vascular: There is dense atherosclerotic calcification of the internal carotid arteries. Skull: Status post bifrontal craniotomy. No acute calvarial abnormality. Sinuses/Orbits: No acute finding. Other: None IMPRESSION: 1. Status post bifrontal craniotomy. 2. Soft tissue density in the region of the known planum sphenoidale meningioma shows no evidence for edema or mass effect by CT. Consider MRI for more accurate comparison. 3. No evidence for acute injury.  Electronically Signed   By: Nolon Nations M.D.   On: 10/15/2016 17:56   US Soft Tissue Head And Neck  Result Date: 10/12/2016 CLINICAL DATA:  Goiter. 66 year old female with history of multinodular goiter. She has undergone prior biopsy of isthmic and left inferior thyroid nodules in August of 2014. EXAM: THYROID ULTRASOUND TECHNIQUE: Ultrasound examination of the thyroid gland and adjacent soft tissues was performed. COMPARISON:  Most recent prior thyroid ultrasound 11/09/2015 ; initial thyroid ultrasound 10/11/2012 FINDINGS: Parenchymal Echotexture: Moderately heterogenous Isthmus: 1.1 cm Right lobe: 3.8 x 1.0 x 1.7 cm Left lobe: 4.5 x 1.7 x 2.1 cm _________________________________________________________ Estimated total number of nodules >/= 1 cm: 3 Number of spongiform nodules >/=  2 cm not described below (TR1): 0 Number of mixed cystic and solid nodules >/= 1.5 cm not described below (TR2): 0 _________________________________________________________ Diffusely heterogeneous multinodular thyroid gland. As seen previously, there are multiple small echogenic nodules scattered throughout the right gland which do not meet criteria for biopsy or follow-up. Head and lobular pyramidal lobe extends superiorly from the thyroid isthmus. No interval change. The previously biopsied nodule in the inferior aspect of the isthmus measures slightly smaller today at 1.5 x 1.0 x 1.4 cm compared to 1.7 x 1.3 x 1.5 cm previously. The previously biopsied nodule in the left inferior gland is essentially unchanged at 2.6 x 1.3 x 1.5 cm compared to 2.4 x 1.8 x 1.9 cm. No new nodule or abnormality identified. IMPRESSION: 1. Stable multinodular goiter with a prominent lobular parental lobe extending superiorly from the thyroid isthmus. 2. The previously biopsied nodule in the inferior isthmus measures slightly smaller on today's examination. 3. The previously biopsied nodule in the left inferior gland is stable. 4. Additional  scattered small and benign-appearing nodules do not meet criteria for biopsy or continued follow-up. The above is in keeping with the ACR TI-RADS recommendations - J Am Coll Radiol 2017;14:587-595. Electronically Signed   By: Jacqulynn Cadet M.D.   On: 10/12/2016 16:16   Ct Abdomen Pelvis W Contrast  Result Date: 10/10/2016 CLINICAL DATA:  66 year old female with small bowel obstruction earlier this month. Generalized abdominal pain and nausea. Diabetes. EXAM: CT ABDOMEN AND PELVIS WITH CONTRAST TECHNIQUE: Multidetector CT imaging of the abdomen and pelvis was performed using the standard protocol following bolus administration of intravenous contrast. CONTRAST:  136mL ISOVUE-300 IOPAMIDOL (ISOVUE-300) INJECTION 61% COMPARISON:  CT Abdomen and Pelvis  09/15/2016, 09/10/2016, and earlier. FINDINGS: Lower chest: Stable and negative. No pericardial or pleural effusion. Hepatobiliary: Surgically absent gallbladder. Stable biliary ductal enlargement. Otherwise negative liver. Pancreas: Stable an negative. Spleen: Stable an negative. Adrenals/Urinary Tract: Normal adrenal glands. Bilateral renal enhancement and contrast excretion is symmetric and normal. Negative course of both ureters. Unremarkable urinary bladder. Stomach/Bowel: Decompressed rectum. Sigmoid colon with mild diverticulosis, and retained stool. Similar diverticulosis and retained stool in the left colon and transverse colon. Mild diverticulosis also in the right colon. Normal appendix. Negative terminal ileum. No dilated or abnormal small bowel loops today. However, the stomach is moderately distended with fluid and food debris. There is an antral contraction of the stomach, but no obstructing etiology is identified. However, the duodenum appears stable and within normal limits. No abdominal free fluid. No free air. Vascular/Lymphatic: Aortoiliac calcified atherosclerosis. Major arterial structures in the abdomen and pelvis are patent. Portal venous  system is patent. Reproductive: Surgically absent. Other: No pelvic free fluid. Probable injection sites in the left abdominal panniculus (such as series 2, image 74). Musculoskeletal: Stable. Advanced lower lumbar spine degeneration and postoperative changes to the lower lumbar posterior elements. IMPRESSION: 1. Moderately distended stomach with fluid and food debris, such that gastroparesis or gastric outlet obstruction are considerations, but there is no dilated small bowel or other bowel obstruction today. 2. Chronic findings including mild Calcified aortic atherosclerosis, and mild diverticulosis of the colon. Electronically Signed   By: Genevie Ann M.D.   On: 10/10/2016 19:34   Dg Chest Port 1 View  Result Date: 10/17/2016 CLINICAL DATA:  Acute respiratory failure.  COPD EXAM: PORTABLE CHEST 1 VIEW COMPARISON:  10/16/2016 FINDINGS: Endotracheal to tube 6.5 cm from carina. Tube projects over the thoracic inlet. NG tube in stomach. Extremely low lung volumes. Bibasilar atelectasis. No pulmonary edema. IMPRESSION: 1. Endotracheal tube appears high; however, this may be positional. 2. Low lung volumes and basilar atelectasis not changed. Electronically Signed   By: Suzy Bouchard M.D.   On: 10/17/2016 09:04   Portable Chest 1 View  Result Date: 10/16/2016 CLINICAL DATA:  Hypoxia EXAM: PORTABLE CHEST 1 VIEW COMPARISON:  October 15, 2016 FINDINGS: Endotracheal tube tip is 5.1 cm above the carina. Nasogastric tube tip and side port are below the diaphragm. No pneumothorax. There is consolidation in the right lung base. Lungs elsewhere clear. Heart is borderline enlarged with pulmonary vascularity within normal limits. No adenopathy. No bone lesions. IMPRESSION: Airspace consolidation right base. Lungs elsewhere clear. Stable cardiac prominence. Tube positions as described without pneumothorax. Electronically Signed   By: Lowella Grip III M.D.   On: 10/16/2016 07:20   Dg Chest Portable 1 View  Result Date:  10/15/2016 CLINICAL DATA:  Code sepsis.  Life support line placement. EXAM: PORTABLE CHEST 1 VIEW COMPARISON:  Chest radiograph September 10, 2016 FINDINGS: Endotracheal tube tip projects 2.2 cm above the carina. Nasogastric tube past GE junction, distal tip not included. Patient rotated to the RIGHT accentuating vascular markings. RIGHT lung base strandy densities. Low inspiratory examination without pleural effusion or focal consolidation. Stable RIGHT midlung zone scarring. No pneumothorax. Soft tissue planes and included osseous structures are unchanged. IMPRESSION: Endotracheal tube tip projects 2.2 cm above the carina. Nasogastric tube past GE junction, distal tip not included. Low inspiratory examination of the RIGHT mid and lower lung zone atelectasis/ scarring. Electronically Signed   By: Elon Alas M.D.   On: 10/15/2016 16:53    Microbiology: Recent Results (from the past 240 hour(s))  Blood  Culture (routine x 2)     Status: None   Collection Time: 10/15/16  4:35 PM  Result Value Ref Range Status   Specimen Description BLOOD LEFT HAND  Final   Special Requests   Final    Blood Culture results may not be optimal due to an inadequate volume of blood received in culture bottles   Culture NO GROWTH 5 DAYS  Final   Report Status 10/20/2016 FINAL  Final  Blood Culture (routine x 2)     Status: None   Collection Time: 10/15/16  4:45 PM  Result Value Ref Range Status   Specimen Description BLOOD LEFT WRIST  Final   Special Requests   Final    Blood Culture results may not be optimal due to an inadequate volume of blood received in culture bottles   Culture NO GROWTH 5 DAYS  Final   Report Status 10/20/2016 FINAL  Final  Urine Culture     Status: None   Collection Time: 10/15/16  5:00 PM  Result Value Ref Range Status   Specimen Description URINE, CATHETERIZED  Final   Special Requests NONE  Final   Culture   Final    NO GROWTH Performed at [facility], [place] [address]  [place], [place] [address]    Report Status 10/17/2016 FINAL  Final  MRSA PCR Screening     Status: None   Collection Time: 10/15/16  8:08 PM  Result Value Ref Range Status   MRSA by PCR NEGATIVE NEGATIVE Final    Comment:        The GeneXpert MRSA Assay (FDA approved for NASAL specimens only), is one component of a comprehensive MRSA colonization surveillance program. It is not intended to diagnose MRSA infection nor to guide or monitor treatment for MRSA infections.   Culture, respiratory (NON-Expectorated)     Status: None   Collection Time: 10/15/16  9:00 PM  Result Value Ref Range Status   Specimen Description SPUTUM  Final   Special Requests NONE  Final   Gram Stain   Final    MODERATE WBC PRESENT,BOTH PMN AND MONONUCLEAR NO SQUAMOUS EPITHELIAL CELLS SEEN NO ORGANISMS SEEN    Culture   Final    NO GROWTH 2 DAYS Performed at [facility] [facility], [address] [place], [place] [address]    Report Status 10/18/2016 FINAL  Final     Labs: Basic Metabolic Panel:  Recent Labs Lab 10/15/16 1652 10/15/16 2030 10/16/16 0332 10/17/16 0558 10/18/16 0458 10/20/16 0751  NA 144  --  140 138 138 137  K 3.5  --  4.1 3.8 3.8 3.7  CL 107  --  112* 109 106 103  CO2 25  --  20* 22 21* 27  GLUCOSE 233*  --  185* 198* 238* 222*  BUN 28*  --  19 10 11 15   CREATININE 1.91*  --  0.93 0.65 0.63 0.73  CALCIUM 9.2  --  8.3* 8.0* 8.0* 8.6*  MG  --  2.0  --   --   --   --    Liver Function Tests:  Recent Labs Lab 10/15/16 1652 10/16/16 0332 10/17/16 0558  AST 29 27 24   ALT 22 19 18   ALKPHOS 96 86 77  BILITOT 0.4 0.3 0.6  PROT 6.9 6.2* 6.5  ALBUMIN 3.7 3.2* 3.2*   No results for input(s): LIPASE, AMYLASE in the last 168 hours.  Recent Labs Lab 10/15/16 1933 10/16/16 0932  AMMONIA 44*  40*   CBC:  Recent Labs Lab 10/15/16 1652 10/16/16 0332 10/17/16 0558 10/18/16 0458 10/20/16 0751  WBC 6.7 5.3 9.6 7.6 6.3  NEUTROABS 3.9  --  8.7*  --   --   HGB 10.2* 9.5*  9.2* 9.0* 10.2*  HCT 34.2* 31.7* 30.3* 29.9* 32.9*  MCV 76.2* 76.0* 75.0* 76.3* 73.6*  PLT 244 217 219 213 244   Cardiac Enzymes:  Recent Labs Lab 10/15/16 1933 10/15/16 2030 10/16/16 0332 10/16/16 0932  TROPONINI <0.03 <0.03 <0.03 <0.03   BNP: BNP (last 3 results) No results for input(s): BNP in the last 8760 hours.  ProBNP (last 3 results) No results for input(s): PROBNP in the last 8760 hours.  CBG:  Recent Labs Lab 10/19/16 1621 10/19/16 2131 10/20/16 0452 10/20/16 0742 10/20/16 1119  GLUCAP 148* 201* 256* 242* 181*       Signed:  Jamal Haskin MD.  [facility] 10/20/2016, 1:50 PM

## 2016-10-20 NOTE — Procedures (Signed)
[name] A. Merlene Laughter, MD     [email or web address]           HISTORY: The patient is a 66 year old female who has a baseline history of episodes of unresponsiveness. She was found in the emergency room to be unresponsive and hypotensive requiring fluids. The studies being done to evaluate for epileptic seizures for cause of these events.  MEDICATIONS: Scheduled Meds: Continuous Infusions: PRN Meds:.  Prior to Admission medications   Medication Sig Start Date End Date Taking? Authorizing Provider  ACCU-CHEK FASTCLIX LANCETS MISC Use to test blood sugar 4 times daily. Dx: E10.65 12/03/14   Philemon Kingdom, MD  ACCU-CHEK SMARTVIEW test strip TEST BLOOD SUGAR FOUR TIMES DAILY AS DIRECTED 09/19/16   Philemon Kingdom, MD  albuterol (PROVENTIL HFA;VENTOLIN HFA) 108 (90 Base) MCG/ACT inhaler Inhale 1-2 puffs every 6 hours as needed for wheezing, shortness of breath 07/27/16   Baird Lyons D, MD  Alcohol Swabs (B-D SINGLE USE SWABS REGULAR) PADS Use for injections and glucose testing 8 times daily. Dx: E10.65 12/03/14   Philemon Kingdom, MD  amLODipine (NORVASC) 5 MG tablet TAKE 2 TABLETS BY MOUTH ONCE DAILY. 09/25/16   Philemon Kingdom, MD  aspirin EC 81 MG tablet Take 81 mg by mouth daily.    [provider]  Azelastine-Fluticasone (DYMISTA) 137-50 MCG/ACT SUSP Place 1 puff into the nose at bedtime. 07/27/15   Deneise Lever, MD  BD PEN NEEDLE NANO U/F 32G X 4 MM MISC USE FOUR TIMES DAILY 04/20/15   Philemon Kingdom, MD  Blood Glucose Calibration (ACCU-CHEK SMARTVIEW CONTROL) LIQD 1 each by Other route as needed. 12/03/14   Philemon Kingdom, MD  Blood Glucose Monitoring Suppl (ACCU-CHEK NANO SMARTVIEW) W/DEVICE KIT 1 each by Does not apply route daily. Dx: E10.65 Patient not taking: Reported on 09/27/2016 12/03/14   Philemon Kingdom, MD  Cholecalciferol (VITAMIN D) 2000 units CAPS Take 1 capsule by mouth daily.    [provider]  clobetasol cream  (TEMOVATE) 8.52 % Apply 1 application topically daily.    [provider]  cloNIDine (CATAPRES) 0.3 MG tablet TAKE 1 TABLET BY MOUTH EVERY EIGHT HOURS. ONCE AT 8AM, 4PM, AND 12 MIDNIGHT. 08/30/16   [name], MD  Coenzyme Q-10 100 MG capsule Take 1 capsule (100 mg total) by mouth 3 (three) times daily. 08/09/16   Pieter Partridge, DO  diclofenac sodium (VOLTAREN) 1 % GEL Apply 2 g topically daily as needed (Pain). 07/21/16   [name], MD  dicyclomine (BENTYL) 10 MG capsule Take 10 mg by mouth 4 (four) times daily -  before meals and at bedtime.    [provider]  hydroxychloroquine (PLAQUENIL) 200 MG tablet Take 200 mg by mouth 2 (two) times daily.     [provider]  Insulin Glargine (TOUJEO SOLOSTAR) 300 UNIT/ML SOPN Inject 25 Units into the skin at bedtime. 06/27/16   Philemon Kingdom, MD  lamoTRIgine (LAMICTAL) 100 MG tablet Take 1 tablet (100 mg total) by mouth 2 (two) times daily. 08/30/16   Pieter Partridge, DO  levothyroxine (SYNTHROID, LEVOTHROID) 50 MCG tablet TAKE 1 TABLET BY MOUTH ONCE DAILY AND 1/2 TABLET ON SUNDAYS. 05/02/16   Philemon Kingdom, MD  LORazepam (ATIVAN) 0.5 MG tablet Take 1 tablet (0.5 mg total) by mouth 3 (three) times daily. 09/27/16   Cloria Spring, MD  losartan (COZAAR) 50 MG tablet TAKE ONE TABLET BY MOUTH DAILY. 07/04/16   [name], MD  metoprolol tartrate (LOPRESSOR) 50  MG tablet Take 1 tablet (50 mg total) by mouth 2 (two) times daily. Please call and schedule appointment 09/26/16   Pixie Casino, MD  montelukast (SINGULAIR) 10 MG tablet TAKE ONE TABLET BY MOUTH ONCE DAILY. 08/30/16   [name], MD  Multiple Vitamins-Minerals (ONE-A-DAY 50 PLUS PO) Take 1 tablet by mouth daily.    [provider]  NOVOLOG FLEXPEN 100 UNIT/ML FlexPen INJECT 16 TO 30 UNITS INTO THE SKIN 3 (THREE) TIMES DAILY WITH MEALS. 01/06/16   Philemon Kingdom, MD  nystatin (MYCOSTATIN/NYSTOP) powder APPLY TO AFFECTED AREA  FOUR TIMES DAILY. 04/03/16   [name], MD  ondansetron (ZOFRAN ODT) 8 MG disintegrating tablet Take 1 tablet (8 mg total) by mouth every 8 (eight) hours as needed for nausea or vomiting. 10/10/16   Jola Schmidt, MD  pregabalin (LYRICA) 75 MG capsule Take 75 mg by mouth 2 (two) times daily.  10/26/11   Carole Civil, MD  RABEprazole (ACIPHEX) 20 MG tablet Take 1 tablet (20 mg total) by mouth 2 (two) times daily. 06/12/16   Pyrtle, Lajuan Lines, MD  RESTASIS 0.05 % ophthalmic emulsion Place 1 drop into both eyes 2 (two) times daily.  02/04/14   [provider]  Riboflavin 400 MG TABS Take 400 mg by mouth daily. Patient not taking: Reported on 09/27/2016 08/09/16   Pieter Partridge, DO  risperiDONE (RISPERDAL) 0.5 MG tablet Take 1 tablet (0.5 mg total) by mouth at bedtime. 09/27/16   Cloria Spring, MD  rosuvastatin (CRESTOR) 5 MG tablet TAKE 1 TABLET BY MOUTH AT BEDTIME. 08/30/16   [name], MD  sertraline (ZOLOFT) 100 MG tablet Take 2 tablets (200 mg total) by mouth daily. 09/27/16   Cloria Spring, MD  traZODone (DESYREL) 100 MG tablet Take 2 tablets (200 mg total) by mouth at bedtime. 09/27/16   Cloria Spring, MD  vitamin B-12 (CYANOCOBALAMIN) 1000 MCG tablet Take 1,000 mcg by mouth daily.    [provider]      ANALYSIS: A 16 channel recording using standard 10 20 measurements is conducted for 21 minutes. There is a well-formed posterior dominant rhythm of 9-1/2 Hz which attenuates with eye opening. There is beta activity observing the frontal areas. Awake and drowsy activities are observed. Photic simulation and hypoventilation are not conducted. The patient is noted to have increased myogenic artifact in the frontal region especially associated with open her mouth. No electrographic correlates are revealed however. There is no focal or lateral slowing. There is no epileptiform activities observed.   IMPRESSION: This is a normal recording awake and drowsy  states.      Jajuan Skoog A. Merlene [name], M.D.  Diplomate, Tax adviser of Psychiatry and Neurology ( Neurology).

## 2016-10-20 NOTE — Progress Notes (Signed)
She says she feels back to normal. Her breathing is okay. She has no complaints. She wants to go home.  Exam: Constitutional: She is awake and alert oriented and in no acute distress. Respiratory: Respiratory effort is normal in her lungs are clear.  Assessment: She is much better. She uses C Pap at home.   I will plan to sign off. Thanks for allowing me to see her with you

## 2016-10-20 NOTE — Care Management Note (Signed)
Case Management Note  Patient Details  Name: [name] MRN: [id number] Date of Birth: 16-Nov-1950  Expected Discharge Date:  10/20/16               Expected Discharge Plan:  [place]  Discharge planning Services  CM Consult  Post Acute Care Choice:  Home Health, Resumption of Svcs/PTA Provider Choice offered to:  Patient  HH Arranged:  RN, PT [facility] Agency:  [place]  Status of Service:  Completed, signed off   Additional Comments:  Discharging home today with resumption of [facility] services through [facility]. Sarah, [facility] rep, aware of DC today and will obtain pt info. Pt aware HH has 48 hrs to resume services.   Sherald Barge, RN 10/20/2016, 2:01 PM

## 2016-10-20 NOTE — Progress Notes (Signed)
Pharmacy Antibiotic Note  [name] is a 66 y.o. female admitted on 10/15/2016 with sepsis.  Pharmacy has been consulted for Levaquin dosing.  PHARMACIST - PHYSICIAN COMMUNICATION CONCERNING: Antibiotic IV to Oral Route Change Policy  RECOMMENDATION: This patient is receiving LEVAQUIN by the intravenous route.  Based on criteria approved by the Pharmacy and Therapeutics Committee, the antibiotic(s) is/are being converted to the equivalent oral dose form(s).  DESCRIPTION: These criteria include:  Patient being treated for a respiratory tract infection, urinary tract infection, cellulitis or clostridium difficile associated diarrhea if on metronidazole  The patient is not neutropenic and does not exhibit a GI malabsorption state  The patient is eating (either orally or via tube) and/or has been taking other orally administered medications for a least 24 hours  The patient is improving clinically and has a Tmax < 100.5  If you have questions about this conversion, please contact the Pharmacy Department  [x]   [phone] )  [facility] []   [phone] )  [facility] []   [phone] )  [facility] []   [phone] )  [facility] []   [phone] )  [place]: Levaquin to 750 mg po q24 hours F/u renal function, cultures and clinical coure Pharmacy will sign off, duration of therapy per MD  Height: 5' (152.4 cm) Weight: 151 lb 6.4 oz (68.7 kg) IBW/kg (Calculated) : 45.5  Temp (24hrs), Avg:99.1 F (37.3 C), Min:98.9 F (37.2 C), Max:99.3 F (37.4 C)   Recent Labs Lab 10/15/16 1604  10/15/16 1652 10/15/16 1917 10/15/16 2030 10/16/16 0332 10/17/16 0558 10/18/16 0458 10/20/16 0751  WBC  --   --  6.7  --   --  5.3 9.6 7.6 6.3  CREATININE  --   < > 1.91*  --   --  0.93 0.65 0.63 0.73  LATICACIDVEN 3.14*  --   --  1.38 1.7 2.5*  --   --   --   < > = values in this interval not displayed.  Estimated Creatinine Clearance: 59.8  mL/min (by C-G formula based on SCr of 0.73 mg/dL).    Allergies  Allergen Reactions  . Cephalexin Hives  . Iron Nausea And Vomiting  . Milk-Related Compounds Other (See Comments)    Doesn't agree with stomach.   . Penicillins Hives    Has patient had a PCN reaction causing immediate rash, facial/tongue/throat swelling, SOB or lightheadedness with hypotension: Yes Has patient had a PCN reaction causing severe rash involving mucus membranes or skin necrosis: No Has patient had a PCN reaction that required hospitalization No Has patient had a PCN reaction occurring within the last 10 years: No If all of the above answers are "NO", then may proceed with Cephalosporin use.   [name] Phenazopyridine Hcl Hives         Antimicrobials this admission:  Vanc 8/5 >> 8/6 Levaquin 8/5 >>  Aztreonam 8/5 >>8/9  Dose adjustments this admission:  n/a   Microbiology results:  8/5 BCx: ngtd 8/5 UCx:  Ng final 8/5 Sputum:  ngtd 8/5 MRSA PCR: (-)   Thank you for allowing pharmacy to be a part of this patient's care.  Hart Robinsons A 10/20/2016 11:26 AM

## 2016-10-21 DIAGNOSIS — I69354 Hemiplegia and hemiparesis following cerebral infarction affecting left non-dominant side: Secondary | ICD-10-CM | POA: Diagnosis not present

## 2016-10-21 DIAGNOSIS — E119 Type 2 diabetes mellitus without complications: Secondary | ICD-10-CM | POA: Diagnosis not present

## 2016-10-21 DIAGNOSIS — J449 Chronic obstructive pulmonary disease, unspecified: Secondary | ICD-10-CM | POA: Diagnosis not present

## 2016-10-21 DIAGNOSIS — F17210 Nicotine dependence, cigarettes, uncomplicated: Secondary | ICD-10-CM | POA: Diagnosis not present

## 2016-10-21 DIAGNOSIS — G40909 Epilepsy, unspecified, not intractable, without status epilepticus: Secondary | ICD-10-CM | POA: Diagnosis not present

## 2016-10-21 DIAGNOSIS — I509 Heart failure, unspecified: Secondary | ICD-10-CM | POA: Diagnosis not present

## 2016-10-21 DIAGNOSIS — F250 Schizoaffective disorder, bipolar type: Secondary | ICD-10-CM | POA: Diagnosis not present

## 2016-10-21 DIAGNOSIS — Z9181 History of falling: Secondary | ICD-10-CM | POA: Diagnosis not present

## 2016-10-21 DIAGNOSIS — I110 Hypertensive heart disease with heart failure: Secondary | ICD-10-CM | POA: Diagnosis not present

## 2016-10-23 ENCOUNTER — Ambulatory Visit (INDEPENDENT_AMBULATORY_CARE_PROVIDER_SITE_OTHER): Payer: Medicare HMO | Admitting: Family Medicine

## 2016-10-23 ENCOUNTER — Encounter: Payer: Self-pay | Admitting: Family Medicine

## 2016-10-23 ENCOUNTER — Telehealth: Payer: Self-pay | Admitting: Internal Medicine

## 2016-10-23 VITALS — BP 128/72 | HR 68 | Temp 97.5°F | Resp 17 | Ht 59.0 in | Wt 149.5 lb

## 2016-10-23 DIAGNOSIS — R05 Cough: Secondary | ICD-10-CM | POA: Diagnosis not present

## 2016-10-23 DIAGNOSIS — F17200 Nicotine dependence, unspecified, uncomplicated: Secondary | ICD-10-CM | POA: Diagnosis not present

## 2016-10-23 DIAGNOSIS — K5909 Other constipation: Secondary | ICD-10-CM

## 2016-10-23 DIAGNOSIS — R059 Cough, unspecified: Secondary | ICD-10-CM

## 2016-10-23 DIAGNOSIS — F17210 Nicotine dependence, cigarettes, uncomplicated: Secondary | ICD-10-CM

## 2016-10-23 DIAGNOSIS — Z09 Encounter for follow-up examination after completed treatment for conditions other than malignant neoplasm: Secondary | ICD-10-CM

## 2016-10-23 DIAGNOSIS — E669 Obesity, unspecified: Secondary | ICD-10-CM | POA: Diagnosis not present

## 2016-10-23 DIAGNOSIS — R0989 Other specified symptoms and signs involving the circulatory and respiratory systems: Secondary | ICD-10-CM

## 2016-10-23 DIAGNOSIS — E1169 Type 2 diabetes mellitus with other specified complication: Secondary | ICD-10-CM

## 2016-10-23 DIAGNOSIS — E119 Type 2 diabetes mellitus without complications: Secondary | ICD-10-CM

## 2016-10-23 MED ORDER — MILK AND MOLASSES ENEMA: 1.0000 | Freq: Once | RECTAL | 1 refills | Status: AC

## 2016-10-23 NOTE — Telephone Encounter (Signed)
Left message for patient to call back  

## 2016-10-23 NOTE — Telephone Encounter (Signed)
Patient aware that she was hospitalized recently, son took her there , she got ill at [facility], states she nearly died, did not realize she had pneumonia, still c/o no/ poor bowel movements. States she is aware of her appt today, intends to keep it and that her neighbor will bring her. She has been advised to bring all of her mediation to the visit and agrees to do so

## 2016-10-23 NOTE — Patient Instructions (Signed)
F/u as before, call if you need me sooner  You need rept CXR first Monday in September  You are being referred back to your GI doctor for appt re constip[ation  You are being referred for sooner appt with Dr Harrington Challenger  pls continue to work on stopping smoking  Hope you continue to feel better

## 2016-10-24 ENCOUNTER — Other Ambulatory Visit: Payer: Self-pay

## 2016-10-24 ENCOUNTER — Telehealth: Payer: Self-pay | Admitting: Family Medicine

## 2016-10-24 DIAGNOSIS — F17210 Nicotine dependence, cigarettes, uncomplicated: Secondary | ICD-10-CM | POA: Diagnosis not present

## 2016-10-24 DIAGNOSIS — J449 Chronic obstructive pulmonary disease, unspecified: Secondary | ICD-10-CM | POA: Diagnosis not present

## 2016-10-24 DIAGNOSIS — I110 Hypertensive heart disease with heart failure: Secondary | ICD-10-CM | POA: Diagnosis not present

## 2016-10-24 DIAGNOSIS — Z9181 History of falling: Secondary | ICD-10-CM | POA: Diagnosis not present

## 2016-10-24 DIAGNOSIS — E119 Type 2 diabetes mellitus without complications: Secondary | ICD-10-CM | POA: Diagnosis not present

## 2016-10-24 DIAGNOSIS — I69354 Hemiplegia and hemiparesis following cerebral infarction affecting left non-dominant side: Secondary | ICD-10-CM | POA: Diagnosis not present

## 2016-10-24 DIAGNOSIS — F250 Schizoaffective disorder, bipolar type: Secondary | ICD-10-CM | POA: Diagnosis not present

## 2016-10-24 DIAGNOSIS — I509 Heart failure, unspecified: Secondary | ICD-10-CM | POA: Diagnosis not present

## 2016-10-24 DIAGNOSIS — G40909 Epilepsy, unspecified, not intractable, without status epilepticus: Secondary | ICD-10-CM | POA: Diagnosis not present

## 2016-10-24 NOTE — Telephone Encounter (Signed)
Pt requesting sooner appt. Pt scheduled to see Amy Esterwood PA 10/31/16@2pm .

## 2016-10-24 NOTE — Telephone Encounter (Signed)
Melissa from [facility] with patient, left message. Patient's BP 90/70, lethargic, low BS [phone]

## 2016-10-25 ENCOUNTER — Encounter: Payer: Self-pay | Admitting: Podiatry

## 2016-10-25 ENCOUNTER — Ambulatory Visit (INDEPENDENT_AMBULATORY_CARE_PROVIDER_SITE_OTHER): Payer: Medicare HMO | Admitting: Podiatry

## 2016-10-25 DIAGNOSIS — Q828 Other specified congenital malformations of skin: Secondary | ICD-10-CM

## 2016-10-25 DIAGNOSIS — B351 Tinea unguium: Secondary | ICD-10-CM | POA: Diagnosis not present

## 2016-10-25 DIAGNOSIS — M79676 Pain in unspecified toe(s): Secondary | ICD-10-CM | POA: Diagnosis not present

## 2016-10-25 DIAGNOSIS — E1142 Type 2 diabetes mellitus with diabetic polyneuropathy: Secondary | ICD-10-CM | POA: Diagnosis not present

## 2016-10-25 DIAGNOSIS — M2012 Hallux valgus (acquired), left foot: Secondary | ICD-10-CM

## 2016-10-25 DIAGNOSIS — M2011 Hallux valgus (acquired), right foot: Secondary | ICD-10-CM

## 2016-10-25 NOTE — Progress Notes (Addendum)
Complaint:  Visit Type: Patient returns to my office for continued preventative foot care services. Complaint: Patient states" my nails have grown long and thick and become painful to walk and wear shoes" Patient has been diagnosed with DM with neuropathy. The patient presents for preventative foot care services. No changes to ROS  Podiatric Exam: Vascular: dorsalis pedis and posterior tibial pulses are palpable bilateral. Capillary return is immediate. Temperature gradient is WNL. Skin turgor WNL  Sensorium: Diminished  Semmes Weinstein monofilament test. Normal tactile sensation bilaterally. Nail Exam: Pt has thick disfigured discolored nails with subungual debris noted bilateral entire nail hallux through fifth toenails Ulcer Exam: There is no evidence of ulcer or pre-ulcerative changes or infection. Orthopedic Exam: Muscle tone and strength are WNL. No limitations in general ROM. No crepitus or effusions noted. HAV B/L.  Hammer toe second left. Skin: No Porokeratosis. No infection or ulcers  Diagnosis:  Onychomycosis, , Pain in right toe, pain in left toes.  DPN  HAV  B/L  Treatment & Plan Procedures and Treatment: Consent by patient was obtained for treatment procedures. The patient understood the discussion of treatment and procedures well. All questions were answered thoroughly reviewed. Debridement of mycotic and hypertrophic toenails, 1 through 5 bilateral and clearing of subungual debris. No ulceration, no infection noted.  Initiate diabetic shoes paperwork for DPN and HAV  B/L.  Patient was also to be evaluated for gait stability by [facility]. Return Visit-Office Procedure: Patient instructed to return to the office for a follow up visit 3 months for continued evaluation and treatment.    Gardiner Barefoot DPM

## 2016-10-26 ENCOUNTER — Ambulatory Visit (INDEPENDENT_AMBULATORY_CARE_PROVIDER_SITE_OTHER): Payer: Medicare HMO | Admitting: Psychiatry

## 2016-10-26 ENCOUNTER — Telehealth: Payer: Self-pay | Admitting: Family Medicine

## 2016-10-26 DIAGNOSIS — Z9181 History of falling: Secondary | ICD-10-CM | POA: Diagnosis not present

## 2016-10-26 DIAGNOSIS — F331 Major depressive disorder, recurrent, moderate: Secondary | ICD-10-CM | POA: Diagnosis not present

## 2016-10-26 DIAGNOSIS — I110 Hypertensive heart disease with heart failure: Secondary | ICD-10-CM | POA: Diagnosis not present

## 2016-10-26 DIAGNOSIS — F250 Schizoaffective disorder, bipolar type: Secondary | ICD-10-CM | POA: Diagnosis not present

## 2016-10-26 DIAGNOSIS — G40909 Epilepsy, unspecified, not intractable, without status epilepticus: Secondary | ICD-10-CM | POA: Diagnosis not present

## 2016-10-26 DIAGNOSIS — I509 Heart failure, unspecified: Secondary | ICD-10-CM | POA: Diagnosis not present

## 2016-10-26 DIAGNOSIS — J449 Chronic obstructive pulmonary disease, unspecified: Secondary | ICD-10-CM | POA: Diagnosis not present

## 2016-10-26 DIAGNOSIS — E119 Type 2 diabetes mellitus without complications: Secondary | ICD-10-CM | POA: Diagnosis not present

## 2016-10-26 DIAGNOSIS — F17210 Nicotine dependence, cigarettes, uncomplicated: Secondary | ICD-10-CM | POA: Diagnosis not present

## 2016-10-26 DIAGNOSIS — I69354 Hemiplegia and hemiparesis following cerebral infarction affecting left non-dominant side: Secondary | ICD-10-CM | POA: Diagnosis not present

## 2016-10-26 NOTE — Telephone Encounter (Signed)
[name], nurse with [facility], called and left message on nurse line regarding patient. She states that patient was recently discharged from the hospital with diagnoses of pneumonia, respiratory failure and sepsis. She know patient was recently seen by [name]. She states patient has PRN Ventolin inhaler, but thinks the patient needs in-home nebulizer treatments. Please advise. [name] did not give callback number for herself. She gave patient's number for [name] to discuss with patient. # [phone]

## 2016-10-26 NOTE — Progress Notes (Signed)
Patient:  [name]   DOB: [birth year]          MR Number: [id number]  Location: [place]:  [address] [place],  [place],   [place]  Start: Thursday 10/26/2016 10:03 AM End:       Thursday 10/26/2016 10:48 AM   Provider/Observer:     Maurice Small, MSW, LCSW   Chief Complaint:                                         Depression  Reason For Service:               [name] is a 66 y.o. female who presents with a long standing history of recurrent periods of depression beginning when she was 19 and her favorite uncle died. She has been experiencing increased symptoms of depression recently due to stress in the relationship with her son. Per patient's report, he rarely visits her anymore due to his involvement with an older woman who is controlling. She also reports frustration regarding her knees who is her power of attorn      ey and is very demanding per      patient's report. She reports issues with other family members and states she needs help dealing with her family Patient reports multiple psychiatric hospitlaizations due to depression and suicidal ideations with thel last one occuring in 1997. Patient has participated in outpatient psychotherapy and medication management intermittently since age 66.  She currently is seeing psychiatrist Dr. Harrington Challenger . Prior to this, she was being seen at [facility]. Patient also has had ECT at [facility].    Interventions Strategy:  Supportive  Participation Level:   Active      Participation Quality:  Appropriate      Behavioral Observation:  Casual, well groomed, drowsy, speech very slow   Current Psychosocial Factors: Tension in relationship with son, multiple health issues, concerns about grandson  Content of Session:   reviewed symptoms, discussed stressors, facilitated expression of thoughts and feelings regarding stressors,  assisted patient identify, challenge, and replace negative thoughts about her son's relationship  with her grandson and replace with healthy alternatives, assisted patient identify realistic expectations and ways to respect boundaries in their relationship,   Current Status:   improved mood, absence of irritability, decreased worry  Suicidal/Homicidal:    No   Patient Progress:   Good. Patient reports she was in the hospital for pneumonia and infection for about a week. She says she was very sick and was in ICU. She was discharged last week and says she is feeling better today although still weak. She reports she has been trying to look at things differently since she was sick. She states trying to be more calm and to talk more calm way She is pleased that her son was very responsive and supportive. He has increased contact with patient. She also reports receiving strong support from her fellow church members. She continues to express frustration regarding relationship between son and grandson but is expresses increased acceptance. She also expresses less negative comments about grandson's future.   Target Goals:   1.  Learn and implement behavioral strategies to overcome depression.    2.  Verbalize an understanding and resolution of current interpersonal problems.   Last Reviewed:   10/13/2016  Goals Addressed Today:    2  Plan:  Return again in 2 - 3 weeks.  Impression/Diagnosis:   Patient presents with long standing history of recurrent periods  of depression beginning when she was 66 and her favorite uncle died. Patient reports multiple psychiatric hospitlaizations due to depression and suicidal ideations with thel last one occuring in 1997. Patient has participated in outpatient psychotherapy and medication management intermittently since age 70.  She currently is seeing psychiatrist Dr. Harrington Challenger . Prior to this, she was being seen at [facility]. Patient also has had ECT at [facility]. Symptoms have worsened in recent months due to family stress. Current symptoms include depressed mood,  anxiety, excessive worry, and tearfulness.   Diagnosis:  Axis I: MDD recurrent episode, moderate          Axis II: Deferred     Marvelyn Bouchillon, LCSW 10/26/2016

## 2016-10-26 NOTE — Telephone Encounter (Signed)
noted 

## 2016-10-27 ENCOUNTER — Telehealth: Payer: Self-pay | Admitting: Family Medicine

## 2016-10-27 NOTE — Telephone Encounter (Signed)
Patient left message on nurse line. She wants to discuss blood work. Callback# [phone]

## 2016-10-30 DIAGNOSIS — M961 Postlaminectomy syndrome, not elsewhere classified: Secondary | ICD-10-CM | POA: Diagnosis not present

## 2016-10-30 NOTE — Telephone Encounter (Signed)
I left a message on the patient's phone and I will call back

## 2016-10-30 NOTE — Assessment & Plan Note (Signed)
Managed by endo [name] is reminded of the importance of commitment to daily physical activity for 30 minutes or more, as able and the need to limit carbohydrate intake to 30 to 60 grams per meal to help with blood sugar control.   The need to take medication as prescribed, test blood sugar as directed, and to call between visits if there is a concern that blood sugar is uncontrolled is also discussed.   [name] is reminded of the importance of daily foot exam, annual eye examination, and good blood sugar, blood pressure and cholesterol control.  Diabetic Labs Latest Ref Rng & Units 10/20/2016 10/18/2016 10/17/2016 10/16/2016 10/15/2016  HbA1c 4.8 - 5.6 % - - - - 6.3(H)  Microalbumin <2.0 mg/dL - - - - -  Micro/Creat Ratio 0.0 - 30.0 mg/g - - - - -  Chol 100 - 199 mg/dL - - - - -  HDL >39 mg/dL - - - - -  Calc LDL 0 - 99 mg/dL - - - - -  Triglycerides 0 - 149 mg/dL - - - - -  Creatinine 0.44 - 1.00 mg/dL 0.73 0.63 0.65 0.93 -  GFR >60.00 mL/min - - - - -   BP/Weight 10/23/2016 10/20/2016 10/10/2016 09/25/2016 09/18/2016 09/10/2016 0/34/7425  Systolic BP 956 387 564 332 951 - 884  Diastolic BP 72 46 61 74 74 - 70  Wt. (Lbs) 149.5 151.4 - 156 - 158.1 157  BMI 30.2 29.57 - 31.51 - 31.93 31.71  Some encounter information is confidential and restricted. Go to Review Flowsheets activity to see all data.   Foot/eye exam completion dates Latest Ref Rng & Units 09/25/2016 10/08/2015  Eye Exam No Retinopathy - No Retinopathy  Foot exam Order - - -  Foot Form Completion - Done -

## 2016-10-30 NOTE — Progress Notes (Signed)
[name]     MRN: [id number]      DOB: Oct 21, 1950   HPI [name] is here for follow up of recent hospitalization for aspiration pneumonia, she was hospitalized between 8/5 to 10/20/2016,   With acute respiratory failure, hypercapnia and metabolic encephalopathy. Of note her psychotropic medication doses have been significantly reduced and she will need close f/u with psychiatry. In the past 4 to 6 weeks , she has also reported decompensation from a mental health standpoint, alluding to near manic behavior and anger issues and has re engaged a lot with her Counsellor seeking help She is still c/o constipation and poor bM Concerned about the fact that she is still smokng and desperately wants to quit but finds this a real challenge ROS See HPI   Denies chest pains, palpitations and leg swelling .   Denies dysuria, frequency, hesitancy or incontinence. C/o chronic  joint pain, and limitation in mobility. Denies headaches, seizures, numbness, or tingling.  Denies skin break down or rash.   PE  BP 128/72 (BP Location: Left Arm, Patient Position: Sitting, Cuff Size: Normal)   Pulse 68   Temp (!) 97.5 F (36.4 C) (Other (Comment))   Resp 17   Ht 4\' 11"  (1.499 m)   Wt 149 lb 8 oz (67.8 kg)   SpO2 98%   BMI 30.20 kg/m   Patient alert and oriented and in no cardiopulmonary distress.  HEENT: No facial asymmetry, EOMI,   oropharynx pink and moist.  Neck supple no JVD, no mass.  Chest: Decreased air entry , scattered crackles , few wheezes  CVS: S1, S2 no murmurs, no S3.Regular rate.  ABD: Soft no localized tenderness or guarding, decreased bowel sounds   Ext: No edema  MS: Adequate though reduced  ROM spine, shoulders, hips and knees.  Skin: Intact, no ulcerations or rash noted.  Psych: Good eye contact, normal affect. Memory intact not anxious or depressed appearing.  CNS: CN 2-12 intact, power,  normal throughout.no focal deficits noted.   [facility] discharge follow-up Hospitalized for pneumonia causing respiratory failure. Denies current fever , chills or productive cough Completing outpatient antibiotics and has follow up CXR already ordered.  Pneumonia likely due to aspiration and antipsychotic meds have been reduced, pt needs to see p psychiatry also as she has recenly been decompensating from a mental health standpoint  Constipation Ongoing complaint, multifactorial, prescription sent for MOM enema, needs GI eval also, missed recent appt as she was hospirtalized  Labile hypertension Currently controlled no change in medication  Diabetes mellitus type 2 in obese [facility]) Managed by endo [name] is reminded of the importance of commitment to daily physical activity for 30 minutes or more, as able and the need to limit carbohydrate intake to 30 to 60 grams per meal to help with blood sugar control.   The need to take medication as prescribed, test blood sugar as directed, and to call between visits if there is a concern that blood sugar is uncontrolled is also discussed.   [name] is reminded of the importance of daily foot exam, annual eye examination, and good blood sugar, blood pressure and cholesterol control.  Diabetic Labs Latest Ref Rng & Units 10/20/2016 10/18/2016 10/17/2016 10/16/2016 10/15/2016  HbA1c 4.8 - 5.6 % - - - - 6.3(H)  Microalbumin <2.0 mg/dL - - - - -  Micro/Creat Ratio 0.0 - 30.0 mg/g - - - - -  Chol 100 - 199 mg/dL - - - - -  HDL >39 mg/dL - - - - -  Calc LDL 0 - 99 mg/dL - - - - -  Triglycerides 0 - 149 mg/dL - - - - -  Creatinine 0.44 - 1.00 mg/dL 0.73 0.63 0.65 0.93 -  GFR >60.00 mL/min - - - - -   BP/Weight 10/23/2016 10/20/2016 10/10/2016 09/25/2016 09/18/2016 09/10/2016 0/94/7096  Systolic BP 283 662 947 654 650 - 354  Diastolic BP 72 46 61 74 74 - 70  Wt. (Lbs) 149.5 151.4 - 156 - 158.1 157  BMI 30.2 29.57 - 31.51 - 31.93 31.71  Some encounter information is confidential and restricted. Go to  Review Flowsheets activity to see all data.   Foot/eye exam completion dates Latest Ref Rng & Units 09/25/2016 10/08/2015  Eye Exam No Retinopathy - No Retinopathy  Foot exam Order - - -  Foot Form Completion - Done -         Smoker Patient is asked and  confirms current  Nicotine use.  Five to seven minutes of time is spent in counseling the patient of the need to quit smoking  Advice to quit is delivered clearly specifically in reducing the risk of developing heart disease, having a stroke, or of developing all types of cancer, especially lung and oral cancer. Improvement in breathing and exercise tolerance and quality of life is also discussed, as is the economic benefit.  Assessment of willingness to quit or to make an attempt to quit is made and documented  Assistance in quit attempt is made with several and varied options presented, based on patient's desire and need. These include  literature, local classes available, 1800 QUIT NOW number, OTC and prescription medication.  The GOAL to be NICOTINE FREE is re emphasized.  The patient has set a personal goal of either reduction or discontinuation and follow up is arranged between 6 an 16 weeks.

## 2016-10-30 NOTE — Assessment & Plan Note (Signed)
Patient is asked and  confirms current  Nicotine use.  Five to seven minutes of time is spent in counseling the patient of the need to quit smoking  Advice to quit is delivered clearly specifically in reducing the risk of developing heart disease, having a stroke, or of developing all types of cancer, especially lung and oral cancer. Improvement in breathing and exercise tolerance and quality of life is also discussed, as is the economic benefit.  Assessment of willingness to quit or to make an attempt to quit is made and documented  Assistance in quit attempt is made with several and varied options presented, based on patient's desire and need. These include  literature, local classes available, 1800 QUIT NOW number, OTC and prescription medication.  The GOAL to be NICOTINE FREE is re emphasized.  The patient has set a personal goal of either reduction or discontinuation and follow up is arranged between 6 an 16 weeks.   

## 2016-10-30 NOTE — Assessment & Plan Note (Signed)
Currently controlled no change in medication

## 2016-10-30 NOTE — Assessment & Plan Note (Signed)
Hospitalized for pneumonia causing respiratory failure. Denies current fever , chills or productive cough Completing outpatient antibiotics and has follow up CXR already ordered.  Pneumonia likely due to aspiration and antipsychotic meds have been reduced, pt needs to see p psychiatry also as she has recenly been decompensating from a mental health standpoint

## 2016-10-30 NOTE — Telephone Encounter (Signed)
I spoke directly with the patient , she is to take iron tablet 325 mg one daily and to  Stop vit B supplement she has excess level C/o headache, has  appt with neurology this week,needs to hold on neurosurgery appt

## 2016-10-30 NOTE — Assessment & Plan Note (Signed)
Ongoing complaint, multifactorial, prescription sent for MOM enema, needs GI eval also, missed recent appt as she was hospirtalized

## 2016-10-30 NOTE — Telephone Encounter (Signed)
Message left on pt's phone , will call back

## 2016-10-31 ENCOUNTER — Encounter: Payer: Self-pay | Admitting: Physician Assistant

## 2016-10-31 ENCOUNTER — Ambulatory Visit (INDEPENDENT_AMBULATORY_CARE_PROVIDER_SITE_OTHER): Payer: Medicare HMO | Admitting: Physician Assistant

## 2016-10-31 VITALS — BP 120/58 | HR 62 | Ht 59.0 in | Wt 147.0 lb

## 2016-10-31 DIAGNOSIS — R32 Unspecified urinary incontinence: Secondary | ICD-10-CM | POA: Diagnosis not present

## 2016-10-31 DIAGNOSIS — G40909 Epilepsy, unspecified, not intractable, without status epilepticus: Secondary | ICD-10-CM | POA: Diagnosis not present

## 2016-10-31 DIAGNOSIS — J449 Chronic obstructive pulmonary disease, unspecified: Secondary | ICD-10-CM | POA: Diagnosis not present

## 2016-10-31 DIAGNOSIS — F17210 Nicotine dependence, cigarettes, uncomplicated: Secondary | ICD-10-CM | POA: Diagnosis not present

## 2016-10-31 DIAGNOSIS — E119 Type 2 diabetes mellitus without complications: Secondary | ICD-10-CM | POA: Diagnosis not present

## 2016-10-31 DIAGNOSIS — K5909 Other constipation: Secondary | ICD-10-CM | POA: Diagnosis not present

## 2016-10-31 DIAGNOSIS — I110 Hypertensive heart disease with heart failure: Secondary | ICD-10-CM | POA: Diagnosis not present

## 2016-10-31 DIAGNOSIS — I509 Heart failure, unspecified: Secondary | ICD-10-CM | POA: Diagnosis not present

## 2016-10-31 DIAGNOSIS — M150 Primary generalized (osteo)arthritis: Secondary | ICD-10-CM | POA: Diagnosis not present

## 2016-10-31 DIAGNOSIS — F250 Schizoaffective disorder, bipolar type: Secondary | ICD-10-CM | POA: Diagnosis not present

## 2016-10-31 DIAGNOSIS — I69354 Hemiplegia and hemiparesis following cerebral infarction affecting left non-dominant side: Secondary | ICD-10-CM | POA: Diagnosis not present

## 2016-10-31 DIAGNOSIS — I639 Cerebral infarction, unspecified: Secondary | ICD-10-CM | POA: Diagnosis not present

## 2016-10-31 DIAGNOSIS — Z9181 History of falling: Secondary | ICD-10-CM | POA: Diagnosis not present

## 2016-10-31 MED ORDER — LUBIPROSTONE 24 MCG PO CAPS: 24.0000 ug | ORAL_CAPSULE | Freq: Two times a day (BID) | ORAL | 6 refills | Status: DC

## 2016-10-31 NOTE — Patient Instructions (Addendum)
  We have sent the following medications to your pharmacy for you to pick up at your convenience: [facility]. 1. Amitiza 58mcg    Bowel purge with Clenpiq.  Step 1: Drink one 5.4 oz bottle of CLENPIQ Step 2: Drink five (5) 8 oz. glasses of clear liquid over the next 3 hours. You may continue to drink additional clear liquids until bedtime.  Step 1:Drink the second 5.4 oz bottle of CLENPIQ Step 2: Drink three (3) 8 oz glasses of clear liquids over the next 2 hours

## 2016-11-01 ENCOUNTER — Other Ambulatory Visit: Payer: Self-pay | Admitting: Internal Medicine

## 2016-11-01 DIAGNOSIS — I110 Hypertensive heart disease with heart failure: Secondary | ICD-10-CM | POA: Diagnosis not present

## 2016-11-01 DIAGNOSIS — E119 Type 2 diabetes mellitus without complications: Secondary | ICD-10-CM | POA: Diagnosis not present

## 2016-11-01 DIAGNOSIS — Z9181 History of falling: Secondary | ICD-10-CM | POA: Diagnosis not present

## 2016-11-01 DIAGNOSIS — F250 Schizoaffective disorder, bipolar type: Secondary | ICD-10-CM | POA: Diagnosis not present

## 2016-11-01 DIAGNOSIS — G40909 Epilepsy, unspecified, not intractable, without status epilepticus: Secondary | ICD-10-CM | POA: Diagnosis not present

## 2016-11-01 DIAGNOSIS — I69354 Hemiplegia and hemiparesis following cerebral infarction affecting left non-dominant side: Secondary | ICD-10-CM | POA: Diagnosis not present

## 2016-11-01 DIAGNOSIS — J449 Chronic obstructive pulmonary disease, unspecified: Secondary | ICD-10-CM | POA: Diagnosis not present

## 2016-11-01 DIAGNOSIS — F17210 Nicotine dependence, cigarettes, uncomplicated: Secondary | ICD-10-CM | POA: Diagnosis not present

## 2016-11-01 DIAGNOSIS — I509 Heart failure, unspecified: Secondary | ICD-10-CM | POA: Diagnosis not present

## 2016-11-01 NOTE — Progress Notes (Addendum)
Subjective:    Patient ID: [name], female    DOB: 1950-04-13, 66 y.o.   MRN: [id number]  HPI Arcelia is a pleasant 66 year old African-American female known to Dr. Hilarie Fredrickson. She comes in today referred by her PCP Dr. Tula Nakayama for evaluation of ongoing constipation. Patient has multiple comorbidities including COPD, recent hospitalization for aspiration pneumonia in early August, major depression, bipolar disorder, GERD, IBS, diverticulosis, pulmonary hypertension, gastroparesis, adult-onset diabetes mellitus, seizure disorder. She was hospitalized in June 2018 with small bowel obstruction which resolved with conservative management. She has history of adenomatous colon polyps and last had colonoscopy in November 2017 with finding of 2 small sessile polyps which were both removed, and multiple left colon diverticuli. Polyps were tubular adenomas. She also had EGD at that same time which showed mild to moderate esophagitis. Esophageal biopsies showed benign squamous mucosa and gastric biopsy showed a reactive gastropathy no H. pylori. Patient says she has had constipation for many years but that she's had more difficulty over the past few months after she had the small bowel obstruction. She says that she goes 7-10 days without bowel movements frequently. She has been taking MiraLAX 3 times daily recently and still is constipated and passing pellet-like stools. She says she stays bloated and uncomfortable. She has been trying to eat a lot of fiber and drinking a lot of water. In addition has tried fiber supplements without benefit and Dulcolax which causes abdominal pain and cramping. Appetite has been okay, she denies any nausea or vomiting and has no specific complaints of abdominal pain.  Review of Systems Pertinent positive and negative review of systems were noted in the above HPI section.  All other review of systems was otherwise negative.  Outpatient Encounter Prescriptions as of  10/31/2016  Medication Sig  . albuterol (PROVENTIL HFA;VENTOLIN HFA) 108 (90 Base) MCG/ACT inhaler Inhale 1-2 puffs every 6 hours as needed for wheezing, shortness of breath  . amLODipine (NORVASC) 5 MG tablet TAKE 2 TABLETS BY MOUTH ONCE DAILY.  [name] aspirin EC 81 MG tablet Take 81 mg by mouth daily.  . Cholecalciferol (VITAMIN D) 2000 units CAPS Take 1 capsule by mouth daily.  . cloNIDine (CATAPRES) 0.3 MG tablet TAKE 1 TABLET BY MOUTH EVERY EIGHT HOURS. ONCE AT 8AM, 4PM, AND 12 MIDNIGHT.  [name] diclofenac sodium (VOLTAREN) 1 % GEL Apply 2 g topically daily as needed (Pain).  [name] dicyclomine (BENTYL) 10 MG capsule Take 10 mg by mouth 4 (four) times daily -  before meals and at bedtime.  . docusate sodium (COLACE) 100 MG capsule Take 200 mg by mouth at bedtime.  . fluticasone (FLONASE) 50 MCG/ACT nasal spray Place 2 sprays into both nostrils daily.  . hydroxychloroquine (PLAQUENIL) 200 MG tablet Take 200 mg by mouth 2 (two) times daily.   . Insulin Glargine (TOUJEO SOLOSTAR) 300 UNIT/ML SOPN Inject 25 Units into the skin at bedtime.  . lamoTRIgine (LAMICTAL) 100 MG tablet Take 1 tablet (100 mg total) by mouth 2 (two) times daily.  [name] levothyroxine (SYNTHROID, LEVOTHROID) 50 MCG tablet TAKE 1 TABLET BY MOUTH ONCE DAILY AND 1/2 TABLET ON SUNDAYS.  [name] LORazepam (ATIVAN) 0.5 MG tablet Take 0.5 tablets (0.25 mg total) by mouth 3 (three) times daily.  [name] losartan (COZAAR) 50 MG tablet TAKE ONE TABLET BY MOUTH DAILY.  . metoprolol tartrate (LOPRESSOR) 50 MG tablet Take 1 tablet (50 mg total) by mouth 2 (two) times daily. Please call and schedule appointment  . montelukast (SINGULAIR) 10  MG tablet TAKE ONE TABLET BY MOUTH ONCE DAILY.  . Multiple Vitamins-Minerals (ONE-A-DAY 50 PLUS PO) Take 1 tablet by mouth daily.  [name] NOVOLOG FLEXPEN 100 UNIT/ML FlexPen INJECT 16 TO 30 UNITS INTO THE SKIN 3 (THREE) TIMES DAILY WITH MEALS.  [name] ondansetron (ZOFRAN ODT) 8 MG disintegrating tablet Take 1 tablet (8 mg total) by mouth  every 8 (eight) hours as needed for nausea or vomiting.  . pregabalin (LYRICA) 75 MG capsule Take 1 capsule (75 mg total) by mouth daily.  . RABEprazole (ACIPHEX) 20 MG tablet Take 1 tablet (20 mg total) by mouth 2 (two) times daily.  . RESTASIS 0.05 % ophthalmic emulsion Place 1 drop into both eyes 2 (two) times daily.   . risperiDONE (RISPERDAL) 0.5 MG tablet Take 1 tablet (0.5 mg total) by mouth at bedtime.  . rosuvastatin (CRESTOR) 5 MG tablet TAKE 1 TABLET BY MOUTH AT BEDTIME.  [name] sertraline (ZOLOFT) 100 MG tablet Take 1 tablet (100 mg total) by mouth daily.  . traZODone (DESYREL) 100 MG tablet Take 1 tablet (100 mg total) by mouth at bedtime.  [name] lubiprostone (AMITIZA) 24 MCG capsule Take 1 capsule (24 mcg total) by mouth 2 (two) times daily with a meal.  . [DISCONTINUED] predniSONE (DELTASONE) 20 MG tablet Take 1 tablet (20 mg total) by mouth daily with breakfast. Take for 5 more days and then stop.   No facility-administered encounter medications on file as of 10/31/2016.    Allergies  Allergen Reactions  . Cephalexin Hives  . Iron Nausea And Vomiting  . Milk-Related Compounds Other (See Comments)    Doesn't agree with stomach.   . Penicillins Hives    Has patient had a PCN reaction causing immediate rash, facial/tongue/throat swelling, SOB or lightheadedness with hypotension: Yes Has patient had a PCN reaction causing severe rash involving mucus membranes or skin necrosis: No Has patient had a PCN reaction that required hospitalization No Has patient had a PCN reaction occurring within the last 10 years: No If all of the above answers are "NO", then may proceed with Cephalosporin use.   [name] Phenazopyridine Hcl Hives         Patient Active Problem List   Diagnosis Date Noted  . Acute respiratory failure with hypoxia ([place])   . HCAP (healthcare-associated pneumonia)/Aspiration PNA 10/16/2016  . Acute respiratory failure with hypercapnia ([place]) 10/15/2016  . Acute metabolic  encephalopathy 17/49/4496  . AKI (acute kidney injury) ([place]) 10/15/2016  . Hypotension   . SBO (small bowel obstruction) ([place]) 09/10/2016  . Diabetic polyneuropathy associated with type 2 diabetes mellitus ([place]) 05/19/2016  . Smoker 04/24/2016  . Class 1 obesity with serious comorbidity and body mass index (BMI) of 33.0 to 33.9 in adult 04/24/2016  . Ulcer of toe, left, limited to breakdown of skin ([place]) 04/18/2016  . History of palpitations 08/09/2015  . Nausea without vomiting 08/09/2015  . Labile hypertension 08/03/2015  . Normal coronary arteries 08/03/2015  . Left-sided low back pain with left-sided sciatica 06/27/2015  . Left flank pain 06/27/2015  . Diabetes mellitus type 2 in obese ([place]) 05/06/2015  . Multinodular goiter 05/06/2015  . Rectocele, female 04/27/2015  . Anal sphincter incontinence 04/27/2015  . Pelvic relaxation due to rectocele 03/30/2015  . Hospital discharge follow-up 03/02/2015  . Pulmonary hypertension ([place]) 02/22/2015  . Weakness of left upper extremity 02/21/2015  . Light cigarette smoker (1-9 cigarettes per day) 01/11/2015  . Migraine without aura and without status migrainosus, not intractable 07/02/2014  . Flatulence  02/18/2014  . Microcytic anemia 02/18/2014  . COPD (chronic obstructive pulmonary disease) with chronic bronchitis ([place]) 09/16/2013  . Hypothyroidism 08/16/2013  . Gastroparesis 04/28/2013  . Seizure disorder ([place]) 01/19/2013  . Cervical disc disorder with radiculopathy of cervical region 10/31/2012  . Solitary pulmonary nodule 08/19/2012  . Anemia 07/05/2012  . 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 Allergic sinusitis 04/18/2012  . Meningioma ([place]) 11/19/2011  . 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 Acute renal failure ([place]) 08/02/2011  . Hemiplegia and hemiparesis following cerebral infarction affecting unspecified side ([place]) 08/02/2011  . Carpal tunnel syndrome of right wrist 05/23/2011  . Polypharmacy  04/28/2011  . Bipolar disorder ([place]) 04/28/2011  . Altered mental status 04/27/2011  . Constipation 04/13/2011  . Falls frequently 12/12/2010  . Oropharyngeal dysphagia 07/12/2010  . Urinary incontinence 12/16/2009  . HEARING LOSS 10/26/2009  . Hyperlipidemia 12/11/2008  . IBS 12/11/2008  . GERD 07/29/2008  . MILK PRODUCTS ALLERGY 07/29/2008  . 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 Backache 06/19/2007  . Osteoporosis 06/19/2007  . Obstructive sleep apnea 06/19/2007  . TRIGGER FINGER 04/18/2007  . DIVERTICULOSIS, COLON 11/13/2006   Social History   Social History  . Marital status: Divorced    Spouse name: N/A  . Number of children: 1  . Years of education: 23   Occupational History  . disabled     Social History Main Topics  . Smoking status: Light Tobacco Smoker    Packs/day: 0.25    Years: 7.00    Types: Cigarettes  . Smokeless tobacco: Never Used     Comment: continues to smoke 1/4 pack a day   . Alcohol use No     Comment:    . Drug use: No  . Sexual activity: Not Currently   Other Topics Concern  . Not on file   Social History Narrative  . No narrative on file    Ms. Noguera's family history includes Alcohol abuse in her maternal uncle; Cancer in her sister and sister; Diabetes in her brother, brother, and father; Heart attack in her maternal grandfather and mother; Hypertension in her brother and son; Kidney failure in her father; Pancreatic cancer in her sister; Pneumonia in her father; Sleep apnea in her son; Stroke in her maternal grandmother.      Objective:    Vitals:   10/31/16 1357  BP: (!) 120/58  Pulse: 62    Physical Exam well-developed older African-American female in no acute distress, accompanied by her young granddaughter. Blood pressure 120/58 pulse 62, height 4 foot 11, weight 147, BMI 29.6. HEENT; nontraumatic normocephalic EOMI PERRLA sclera anicteric, Cardiovascular; regular rate and rhythm with  S1-S2 no murmur rub or gallop, Pulmonary; clear bilaterally, Abdomen; soft, protuberant but nondistended no tympany bowel sounds present and no focal tenderness no palpable mass or hepatosplenomegaly. Rectal; exam not done, Ext; no clubbing cyanosis or edema skin warm and dry, Neuropsych ;mood and affect appropriate       Assessment & Plan:   #48 66 year old African-American female with long-term chronic constipation with recent worsening over the past few months. Refractory to high-dose MiraLAX. Suspect slow transit, complicated by diabetes and multiple medications likely contributing #2 history of tubular adenomatous colon polyps-up-to-date with colonoscopy last done November 2017-follow-up planned 2022 #3 diverticulosis #4 history of small bowel obstruction June 2018 resolved with conservative management #5 adult-onset diabetes mellitus with history of gastroparesis #6 primary hypertension #7 major depression #8 GERD  #9  COPD with recent admission for aspiration pneumonia  Plan; continue high-fiber diet and liberal fluid intake Will give patient a bowel purge with a Clenpiq  Prep, then start trial of Amitiza 24 g by mouth twice daily. Patient asked to call back with a progress report in about 2 weeks.   Jelena Malicoat Genia Harold PA-C 11/01/2016   Cc: [name], MD   Addendum: Reviewed and agree with ongoing management. Pyrtle, Lajuan Lines, MD

## 2016-11-02 ENCOUNTER — Ambulatory Visit (INDEPENDENT_AMBULATORY_CARE_PROVIDER_SITE_OTHER): Payer: Medicare HMO | Admitting: Neurology

## 2016-11-02 ENCOUNTER — Encounter: Payer: Self-pay | Admitting: Neurology

## 2016-11-02 VITALS — BP 171/65 | HR 64 | Ht 59.0 in | Wt 146.0 lb

## 2016-11-02 DIAGNOSIS — E1142 Type 2 diabetes mellitus with diabetic polyneuropathy: Secondary | ICD-10-CM | POA: Diagnosis not present

## 2016-11-02 DIAGNOSIS — G43009 Migraine without aura, not intractable, without status migrainosus: Secondary | ICD-10-CM | POA: Diagnosis not present

## 2016-11-02 DIAGNOSIS — G40909 Epilepsy, unspecified, not intractable, without status epilepticus: Secondary | ICD-10-CM | POA: Diagnosis not present

## 2016-11-02 DIAGNOSIS — Z5181 Encounter for therapeutic drug level monitoring: Secondary | ICD-10-CM | POA: Diagnosis not present

## 2016-11-02 NOTE — Patient Instructions (Signed)
   Would consider Botox therapy for the headaches.

## 2016-11-02 NOTE — Progress Notes (Signed)
Reason for visit: Headache, seizures  Referring physician: Dr. Darnelle Bos Gervin is a 66 y.o. female  History of present illness:  [name] is a 15 year old right-handed black female with a history of bipolar disorder, diabetes, diabetic peripheral neuropathy, meningioma status post resection, seizures, cerebrovascular disease, and chronic daily headaches. The patient also has chronic low back pain, she is followed through Dr. Nelva Bush for this, and Dr. Sherwood Gambler follows her for her meningioma. The patient was seen previously by Dr. Tomi Likens from [facility] neurology, and she has been seen previously through the [place] for her headaches. The patient is on a multitude of medications including Lyrica, trazodone, Zoloft, Risperdal, Lopressor, Lamictal, and clonidine. In the past she has been on gabapentin, amitriptyline, and imipramine as well for headaches, the patient also believes that she may have been on Topamax and Depakote previously. The Depakote caused pancreatitis. She indicates that she got trigger point injections through the [place] which seemed to help her headaches, otherwise other medications have not done much for her. She is having daily headaches that are in the left frontal area and vertex of the head going down into the left ear area that are daily and at times severe. The patient does not remember the last day she had without headache, she indicates that she began having headaches when she was 66 years old. The patient reports photophobia without phonophobia, she denies any significant nausea or vomiting with the headache. The patient takes hydrocodone for her back pain and this also helps her headache some. She takes Tylenol and she claims that she cannot take nonsteroidal anti-inflammatory medications secondary to stomach upset. The patient has a seizure disorder, her last seizure was in or around January 2018. She does not operate a motor vehicle. She  is on Lamictal for her seizures and is tolerating this well. The patient is unable to describe her seizures well. The patient is mainly concerned today about epistaxis, she has seen an ear nose and throat physician but she still has issues with this problem. The patient currently takes Lyrica for her diabetic peripheral neuropathy. She does have a gait disorder, she uses a cane for ambulation. She is sent to this office for evaluation and management of her headaches.  Past Medical History:  Diagnosis Date  . Anemia   . Anxiety    takes Ativan daily  . Arthritis   . Bipolar disorder ([place])    takes Risperdal nightly  . Blood transfusion   . Cancer ([place])    In her gum  . Carpal tunnel syndrome of right wrist 05/23/2011  . Cervical disc disorder with radiculopathy of cervical region 10/31/2012  . Chronic back pain   . Chronic idiopathic constipation   . Colon polyps   . COPD (chronic obstructive pulmonary disease) with chronic bronchitis ([place]) 09/16/2013   Office Spirometry 10/30/2013-submaximal effort based on appearance of loop and curve. Numbers would fit with severe restriction but her physiologic capability may be better than this. FVC 0.91/44%, and 10.74/45%, FEV1/FVC 0.81, FEF 25-75% 1.43/69%    . Depression    takes Zoloft daily  . Diabetes mellitus    Type II  . Diverticulosis    TCS 9/08 by Dr. Delfin Edis for diarrhea . Bx for micro scopic colitis negative.   . Fibromyalgia   . Frequent falls   . GERD (gastroesophageal reflux disease)    takes Aciphex daily  . Glaucoma    eye drops daily  .  Gum symptoms    infection on antibiotic  . Hemiplegia affecting non-dominant side, post-stroke 08/02/2011  . Hyperlipidemia    takes Crestor daily  . Hypertension    takes Amlodipine,Metoprolol,and Clonidine daily  . Hypothyroidism    takes Synthroid daily  . IBS (irritable bowel syndrome)   . Insomnia    takes Trazodone nightly  . Metabolic encephalopathy 2/68/3419  . Migraines     chronic headaches  . Mononeuritis lower limb   . Osteoporosis   . Pancreatitis 2006   due to Depakote with normal EUS   . Schatzki's ring    non critical / EGD with ED 8/2011with RMR  . Seizures ([place])    takes Lamictal daily.Last seizure 3 yrs ago  . Sleep apnea    on CPAP  . Stroke [facility])    left sided weakness  . Tubular adenoma of colon     Past Surgical History:  Procedure Laterality Date  . ABDOMINAL HYSTERECTOMY  1978  . BACK SURGERY  July 2012  . BACTERIAL OVERGROWTH TEST N/A 05/05/2013   Procedure: BACTERIAL OVERGROWTH TEST;  Surgeon: Daneil Dolin, MD;  [facility];  Service: Endoscopy;  Laterality: N/A;  7:30  . BIOPSY THYROID  2009  . BRAIN SURGERY  11/2011   resection of meningioma  . BREAST REDUCTION SURGERY  1994  . CARDIAC CATHETERIZATION  05/10/2005   normal coronaries, normal LV systolic function and EF (Dr. Jackie Plum)  . CARPAL TUNNEL RELEASE Left 07/22/04   Dr. Aline Brochure  . CATARACT EXTRACTION Bilateral   . CHOLECYSTECTOMY  1984  . COLONOSCOPY N/A 09/25/2012   [id number]:[id number] diverticulosis.  colonic polyp-removed : tubular adenoma  . CRANIOTOMY  11/23/2011   Procedure: CRANIOTOMY TUMOR EXCISION;  Surgeon: Hosie Spangle, MD;  Location: [place] NEURO ORS;  Service: Neurosurgery;  Laterality: N/A;  Craniotomy for tumor resection  . ESOPHAGOGASTRODUODENOSCOPY  12/29/2010   [place]-Retained food in the esophagus and stomach, small hiatal hernia, status post Maloney dilation of the esophagus  . ESOPHAGOGASTRODUODENOSCOPY N/A 09/25/2012   [id number] atonic baggy esophagus status post Maloney dilation 9 F. Hiatal hernia  . GIVENS CAPSULE STUDY N/A 01/15/2013   NORMAL.   . IR GENERIC HISTORICAL  03/17/2016   IR RADIOLOGIST EVAL & MGMT 03/17/2016 [facility]-INTERV RAD  . LESION REMOVAL N/A 05/31/2015   Procedure: REMOVAL RIGHT AND LEFT LESIONS OF MANDIBLE;  Surgeon: Diona Browner, DDS;  Location: [place];  Service: Oral Surgery;  Laterality: N/A;  . MALONEY DILATION  12/29/2010    RMR;  . NM MYOCAR PERF WALL MOTION  2006   "relavtiely normal" persantine, mild anterior thinning (breast attenuation artifact), no region of scar/ischemia  . OVARIAN CYST REMOVAL    . RECTOCELE REPAIR N/A 06/29/2015   Procedure: POSTERIOR REPAIR (RECTOCELE);  Surgeon: Jonnie Kind, MD;  Location: [facility];  Service: Gynecology;  Laterality: N/A;  . REDUCTION MAMMAPLASTY Bilateral   . SPINE SURGERY  09/29/2010   Dr. Rolena Infante  . surgical excision of 3 tumors from right thigh and right buttock  and left upper thigh  2010  . TOOTH EXTRACTION Bilateral 12/14/2014   Procedure: REMOVAL OF BILATERAL MANDIBULAR EXOSTOSES;  Surgeon: Diona Browner, DDS;  Location: [facility];  Service: Oral Surgery;  Laterality: Bilateral;  . TRANSTHORACIC ECHOCARDIOGRAM  2010   EF 60-65%, mild conc LVH, grade 1 diastolic dysfunction; mildly calcified MV annulus with mildly thickened leaflets, mildly calcified MR annulus    Family History  Problem Relation Age of Onset  .  Heart attack Mother        HTN  . Pneumonia Father   . Kidney failure Father   . Diabetes Father   . Pancreatic cancer Sister   . Diabetes Brother   . Hypertension Brother   . Diabetes Brother   . Cancer Sister        breast   . Hypertension Son   . Sleep apnea Son   . Cancer Sister        pancreatic  . Stroke Maternal Grandmother   . Heart attack Maternal Grandfather   . Alcohol abuse Maternal Uncle   . Colon cancer Neg Hx   . Anesthesia problems Neg Hx   . Hypotension Neg Hx   . Malignant hyperthermia Neg Hx   . Pseudochol deficiency Neg Hx     Social history:  reports that she has been smoking Cigarettes.  She has a 1.75 pack-year smoking history. She has never used smokeless tobacco. She reports that she does not drink alcohol or use drugs.  Medications:  Prior to Admission medications   Medication Sig Start Date End Date Taking? Authorizing Provider  albuterol (PROVENTIL HFA;VENTOLIN HFA) 108 (90 Base) MCG/ACT inhaler Inhale 1-2  puffs every 6 hours as needed for wheezing, shortness of breath 07/27/16  Yes Young, Clinton D, MD  amLODipine (NORVASC) 5 MG tablet TAKE 2 TABLETS BY MOUTH ONCE DAILY. 11/02/16  Yes Philemon Kingdom, MD  aspirin EC 81 MG tablet Take 81 mg by mouth daily.   Yes [provider]  Cholecalciferol (VITAMIN D) 2000 units CAPS Take 1 capsule by mouth daily.   Yes [provider]  cloNIDine (CATAPRES) 0.3 MG tablet TAKE 1 TABLET BY MOUTH EVERY EIGHT HOURS. ONCE AT 8AM, 4PM, AND 12 MIDNIGHT. 08/30/16  Yes [name], MD  diclofenac sodium (VOLTAREN) 1 % GEL Apply 2 g topically daily as needed (Pain). 07/21/16  Yes [name], MD  dicyclomine (BENTYL) 10 MG capsule Take 10 mg by mouth 4 (four) times daily -  before meals and at bedtime.   Yes [provider]  docusate sodium (COLACE) 100 MG capsule Take 200 mg by mouth at bedtime.   Yes [provider]  fluticasone (FLONASE) 50 MCG/ACT nasal spray Place 2 sprays into both nostrils daily.   Yes [provider]  hydroxychloroquine (PLAQUENIL) 200 MG tablet Take 200 mg by mouth 2 (two) times daily.    Yes [provider]  Insulin Glargine (TOUJEO SOLOSTAR) 300 UNIT/ML SOPN Inject 25 Units into the skin at bedtime. 06/27/16  Yes Philemon Kingdom, MD  lamoTRIgine (LAMICTAL) 100 MG tablet Take 1 tablet (100 mg total) by mouth 2 (two) times daily. 08/30/16  Yes Jaffe, Adam R, DO  levothyroxine (SYNTHROID, LEVOTHROID) 50 MCG tablet TAKE 1 TABLET BY MOUTH ONCE DAILY AND 1/2 TABLET ON SUNDAYS. 05/02/16  Yes Philemon Kingdom, MD  LORazepam (ATIVAN) 0.5 MG tablet Take 0.5 tablets (0.25 mg total) by mouth 3 (three) times daily. 10/20/16  Yes Rexene Alberts, MD  losartan (COZAAR) 50 MG tablet TAKE ONE TABLET BY MOUTH DAILY. 07/04/16  Yes [name], MD  lubiprostone (AMITIZA) 24 MCG capsule Take 1 capsule (24 mcg total) by mouth 2 (two) times daily with a meal. 10/31/16  Yes Esterwood, Amy S, PA-C    metoprolol tartrate (LOPRESSOR) 50 MG tablet Take 1 tablet (50 mg total) by mouth 2 (two) times daily. Please call and schedule appointment 09/26/16  Yes Hilty, Nadean Corwin, MD  montelukast (SINGULAIR)  10 MG tablet TAKE ONE TABLET BY MOUTH ONCE DAILY. 08/30/16  Yes [name], MD  Multiple Vitamins-Minerals (ONE-A-DAY 50 PLUS PO) Take 1 tablet by mouth daily.   Yes [provider]  NOVOLOG FLEXPEN 100 UNIT/ML FlexPen INJECT 16 TO 30 UNITS INTO THE SKIN 3 (THREE) TIMES DAILY WITH MEALS. 01/06/16  Yes Philemon Kingdom, MD  ondansetron (ZOFRAN ODT) 8 MG disintegrating tablet Take 1 tablet (8 mg total) by mouth every 8 (eight) hours as needed for nausea or vomiting. 10/10/16  Yes Jola Schmidt, MD  pregabalin (LYRICA) 75 MG capsule Take 1 capsule (75 mg total) by mouth daily. 10/20/16  Yes Rexene Alberts, MD  RABEprazole (ACIPHEX) 20 MG tablet Take 1 tablet (20 mg total) by mouth 2 (two) times daily. 06/12/16  Yes Pyrtle, Lajuan Lines, MD  RESTASIS 0.05 % ophthalmic emulsion Place 1 drop into both eyes 2 (two) times daily.  02/04/14  Yes [provider]  risperiDONE (RISPERDAL) 0.5 MG tablet Take 1 tablet (0.5 mg total) by mouth at bedtime. 09/27/16  Yes Cloria Spring, MD  rosuvastatin (CRESTOR) 5 MG tablet TAKE 1 TABLET BY MOUTH AT BEDTIME. 08/30/16  Yes [name], MD  sertraline (ZOLOFT) 100 MG tablet Take 1 tablet (100 mg total) by mouth daily. 10/20/16  Yes Rexene Alberts, MD  traZODone (DESYREL) 100 MG tablet Take 1 tablet (100 mg total) by mouth at bedtime. 10/20/16  Yes Rexene Alberts, MD      Allergies  Allergen Reactions  . Cephalexin Hives  . Iron Nausea And Vomiting  . Milk-Related Compounds Other (See Comments)    Doesn't agree with stomach.   . Penicillins Hives    Has patient had a PCN reaction causing immediate rash, facial/tongue/throat swelling, SOB or lightheadedness with hypotension: Yes Has patient had a PCN reaction causing severe rash involving mucus  membranes or skin necrosis: No Has patient had a PCN reaction that required hospitalization No Has patient had a PCN reaction occurring within the last 10 years: No If all of the above answers are "NO", then may proceed with Cephalosporin use.   [name] Phenazopyridine Hcl Hives          ROS:  Out of a complete 14 system review of symptoms, the patient complains only of the following symptoms, and all other reviewed systems are negative.  Fevers, chills, fatigue Blurred vision, eye pain Incontinence of the bowel, constipation Urinary urgency and incontinence Anemia, easy bleeding Joint pain, achy muscles Allergies Confusion, weakness, difficulty swallowing, dizziness, seizures Depression, anxiety, decreased energy, racing thoughts Insomnia, snoring  Blood pressure (!) 171/65, pulse 64, height 4\' 11"  (1.499 m), weight 146 lb (66.2 kg).  Physical Exam  General: The patient is alert and cooperative at the time of the examination. The patient is moderately obese.  Eyes: Pupils are equal, round, and reactive to light. Discs are flat bilaterally.  Neck: The neck is supple, no carotid bruits are noted.  Respiratory: The respiratory examination is clear.  Cardiovascular: The cardiovascular examination reveals a regular rate and rhythm, no obvious murmurs or rubs are noted.  Skin: Extremities are without significant edema.  Neurologic Exam  Mental status: The patient is alert and oriented x 3 at the time of the examination. The patient has apparent normal recent and remote memory, with an apparently normal attention span and concentration ability.  Cranial nerves: Facial symmetry is present. There is good sensation of the face to pinprick and soft touch bilaterally. The strength of the facial  muscles and the muscles to head turning and shoulder shrug are normal bilaterally. Speech is well enunciated, no aphasia or dysarthria is noted. Extraocular movements are full. Visual fields are  full. The tongue is midline, and the patient has symmetric elevation of the soft palate. No obvious hearing deficits are noted.  Motor: The motor testing reveals 5 over 5 strength of all 4 extremities. Good symmetric motor tone is noted throughout.  Sensory: Sensory testing is notable for decreased pinprick sensation on the right arm and leg as the left, decreased vibration sensation on the left arm and leg as compared to the right, position sense is intact on all fours. No evidence of extinction is noted.  Coordination: Cerebellar testing reveals good finger-nose-finger and heel-to-shin bilaterally. Some apraxia with the use of the extremities is seen.  Gait and station: Gait is slightly wide-based, the patient can walk independently. Can gait is unsteady. Romberg is negative, but is unsteady.  Reflexes: Deep tendon reflexes are symmetric, but are depressed bilaterally. Toes are downgoing bilaterally.   CT brain 10/15/16:  IMPRESSION: 1. Status post bifrontal craniotomy. 2. Soft tissue density in the region of the known planum sphenoidale meningioma shows no evidence for edema or mass effect by CT. Consider MRI for more accurate comparison. 3. No evidence for acute injury.  * CT scan images were reviewed online. I agree with the written report.     Assessment/Plan:  1. Bipolar disorder  2. History of meningioma, status post resection  3. Cerebrovascular disease  4. Diabetes, diabetic peripheral neuropathy  5. Chronic daily headaches  6. History of seizures  7. Gait instability  8. Polypharmacy  The patient is on a multitude of medications, she has been through many medications in the past that did not benefit her headaches. I have recommended the use of Botox, the patient wishes to "think about it" and she will let me know if she wishes to proceed with this therapy. This would be good option given the number of medications that she is currently taking. The patient will  have blood work today to check the Lamictal level. She does not operate a motor vehicle. She will follow-up in 4 months, sooner if needed.  Jill Alexanders MD 11/02/2016 9:35 AM  [facility] [address] [place], [place] [address]  Phone [phone] Fax [phone]

## 2016-11-04 LAB — LAMOTRIGINE LEVEL: Lamotrigine Lvl: 17.2 ug/mL (ref 2.0–20.0)

## 2016-11-05 DIAGNOSIS — M79609 Pain in unspecified limb: Secondary | ICD-10-CM | POA: Diagnosis not present

## 2016-11-06 ENCOUNTER — Ambulatory Visit (INDEPENDENT_AMBULATORY_CARE_PROVIDER_SITE_OTHER): Payer: Medicare HMO | Admitting: Internal Medicine

## 2016-11-06 ENCOUNTER — Encounter: Payer: Self-pay | Admitting: Internal Medicine

## 2016-11-06 VITALS — BP 110/52 | HR 72 | Ht 60.0 in | Wt 147.0 lb

## 2016-11-06 DIAGNOSIS — E1169 Type 2 diabetes mellitus with other specified complication: Secondary | ICD-10-CM | POA: Diagnosis not present

## 2016-11-06 DIAGNOSIS — E039 Hypothyroidism, unspecified: Secondary | ICD-10-CM | POA: Diagnosis not present

## 2016-11-06 DIAGNOSIS — F250 Schizoaffective disorder, bipolar type: Secondary | ICD-10-CM | POA: Diagnosis not present

## 2016-11-06 DIAGNOSIS — J449 Chronic obstructive pulmonary disease, unspecified: Secondary | ICD-10-CM | POA: Diagnosis not present

## 2016-11-06 DIAGNOSIS — E042 Nontoxic multinodular goiter: Secondary | ICD-10-CM | POA: Diagnosis not present

## 2016-11-06 DIAGNOSIS — E119 Type 2 diabetes mellitus without complications: Secondary | ICD-10-CM | POA: Diagnosis not present

## 2016-11-06 DIAGNOSIS — E669 Obesity, unspecified: Secondary | ICD-10-CM | POA: Diagnosis not present

## 2016-11-06 DIAGNOSIS — G40909 Epilepsy, unspecified, not intractable, without status epilepticus: Secondary | ICD-10-CM | POA: Diagnosis not present

## 2016-11-06 DIAGNOSIS — I69354 Hemiplegia and hemiparesis following cerebral infarction affecting left non-dominant side: Secondary | ICD-10-CM | POA: Diagnosis not present

## 2016-11-06 DIAGNOSIS — I509 Heart failure, unspecified: Secondary | ICD-10-CM | POA: Diagnosis not present

## 2016-11-06 DIAGNOSIS — I110 Hypertensive heart disease with heart failure: Secondary | ICD-10-CM | POA: Diagnosis not present

## 2016-11-06 DIAGNOSIS — F17210 Nicotine dependence, cigarettes, uncomplicated: Secondary | ICD-10-CM | POA: Diagnosis not present

## 2016-11-06 DIAGNOSIS — Z9181 History of falling: Secondary | ICD-10-CM | POA: Diagnosis not present

## 2016-11-06 MED ORDER — INSULIN ASPART 100 UNIT/ML FLEXPEN: PEN_INJECTOR | SUBCUTANEOUS | 3 refills | Status: DC

## 2016-11-06 MED ORDER — INSULIN GLARGINE 300 UNIT/ML ~~LOC~~ SOPN: 30.0000 [IU] | PEN_INJECTOR | Freq: Every day | SUBCUTANEOUS | 3 refills | Status: DC

## 2016-11-06 NOTE — Progress Notes (Signed)
Patient ID: [name], female   DOB: [birth year], 66 y.o.   MRN: [id number]  HPI: [name] is a 66 y.o.-year-old female, initially referred by her PCP, [name], for management of DM2, dx 1995, insulin-dependent since 1997, uncontrolled, with complications (gastroparesis, cerebrovasc. Ds-  H/o stroke, PN),  Hypothyroidism, thyroid nodules.  Last visit 4 mo ago.  Since last visit, patient was admitted with AMS + sepsis on 10/15/2016. She had aspiration pneumonia and acute respiratory failure. She was on Prednisone after d/c for 3 weeks. Sugars higher.  She stopped taking all insulin for a period of time for 2-3 weeks after the hospitalization ("I just stopped"). Now back on the previous regimen.  DM2: Last hemoglobin A1c was: Lab Results  Component Value Date   HGBA1C 6.3 (H) 10/15/2016   HGBA1C 6.6 07/07/2016   HGBA1C 6.3 02/02/2016  10/28/2013: HbA1c 8.5%  She is now on: - Toujeo 25 >> 30 units at bedtime - Novolog:  17 units with a smaller meal  24 units with a larger meal >> only uses it when eating out Could not tolerate Metformin >> nausea.   Pt checks her sugars 4x a day and they are: - am: 64-160, 218, 316 >> 44, 58, 74-140, 172, 212 >> 86-158 - 2h after b'fast: n/c  - before lunch:  63-140, 160, 297 >>70, 82-149, 155 >>> 90-206, 253 (Prednisone) - 2h after lunch: n/c >> 127-154, 229 >> n/c - before dinner: 44, 72, 99-192, 210, 230, 256, 525 >> 86-160, 190 but 200s on Prednisone - 2h after dinner: n/c - bedtime:  100-160 >> 79, 120-180, 201, 301 >> 121-190, 386 >> 86-180, 250s on Prednisone - nighttime: n/c No lows. Lowest sugar was 44x1 >> 76 >> 63 >> 44 >> 86; she has hypoglycemia awareness at 70.  Highest sugar was 332 >> 319 >> 500s (infection) >> 250s (Prednisone)   Pt's meals are: - Breakfast: eggs, cereals, fruit, oatmeal, bacon - Lunch: sandwich, beef, mashed potatoes,diet soda - Dinner: chicken, beef, fish, potatoes, vegetables - Snacks: 1-2:  fruit  - she has mild CKD, last BUN/creatinine:  Lab Results  Component Value Date   BUN 15 10/20/2016   CREATININE 0.73 10/20/2016  On Losartan. - last set of lipids: Lab Results  Component Value Date   CHOL 158 04/18/2016   HDL 44 04/18/2016   LDLCALC 75 04/18/2016   TRIG 195 (H) 04/18/2016   CHOLHDL 3.1 07/19/2015  On Ezetimibe + Crestor. - last eye exam was in 09/2016 >> No DR. Had cataract sx B. Now seeing Dr. Hassell Done [facility]). -  She has numbness and tingling in her leg L leg (affected by stroke) but also in R. Sees podiatry.  Hypothyroidism: - dx > 10 years ago. - On Levoxyl 50 mcg 6/7 days and 25 1/7 days: - in am - fasting - at least 30 min from b'fast - no Ca, Fe - + MVI at noon and Aciphex at night - not on Biotin  LatestTSH levels normal: Lab Results  Component Value Date   TSH 0.827 10/15/2016   TSH 0.906 01/22/2016   TSH 1.030 11/03/2015   TSH 1.207 02/21/2015   TSH 1.352 11/09/2014   She has a h/o thyroid nodules. + dysphagia with certain foods. Pt denies: - feeling nodules in neck - hoarseness - choking - SOB with lying down  reviewed thyroid U/S (10/2013):  Right thyroid lobe: 3.7 x 1.0 x 1.8 cm   Left thyroid lobe: 3.5 x 1.5  x 1.6 cm   Isthmus: 0.5 cm   Focal nodules: There is a 0.3 mm calcified nodule in the right midzone. There is a 0.6 x 0.7 x 0.5 cm hypoechoic nodule in the  lateral aspect of the right midzone. There is a 0.7 x 0.6 x 0.5 cm hyperechoic nodule in the lateral aspect of the right lower pole.  There is a 2.2 x 1.1 x 1.7 cm complex solid nodule in the inferior aspect of the isthmus extending adjacent to the left lower pole.  There is a 1.4 x 2.1 x 1.4 cm complex solid nodule in the left lower pole.   Lymphadenopathy: None visualized.  Repeat U/S (01/2014): stable appearance of nodules  FNA (2014) x2: benign  Repeat U/S (10/2015): stable appearance of nodules  She had a L rib fracture in 12/2014.  ROS: Constitutional:  no weight gain/no weight loss, no fatigue, no subjective hyperthermia,+ subjective hypothermia Eyes:+ blurry vision, no xerophthalmia ENT: no sore throat, no nodules palpated in throat, no dysphagia, no odynophagia, no hoarseness Cardiovascular: no CP/no SOB/no palpitations/no leg swelling Respiratory: no cough/no SOB/no wheezing Gastrointestinal: + N/+ V/no D/+ C/no acid reflux Musculoskeletal: no muscle aches/no joint aches Skin: no rashes, no hair loss Neurological: no tremors/no numbness/no tingling/no dizziness, + HAs  I reviewed pt's medications, allergies, PMH, social hx, family hx, and changes were documented in the history of present illness. Otherwise, unchanged from my initial visit note.  Past Medical History:  Diagnosis Date  . Anemia   . Anxiety    takes Ativan daily  . Arthritis   . Bipolar disorder ([place])    takes Risperdal nightly  . Blood transfusion   . Cancer ([place])    In her gum  . Carpal tunnel syndrome of right wrist 05/23/2011  . Cervical disc disorder with radiculopathy of cervical region 10/31/2012  . Chronic back pain   . Chronic idiopathic constipation   . Colon polyps   . COPD (chronic obstructive pulmonary disease) with chronic bronchitis ([place]) 09/16/2013   Office Spirometry 10/30/2013-submaximal effort based on appearance of loop and curve. Numbers would fit with severe restriction but her physiologic capability may be better than this. FVC 0.91/44%, and 10.74/45%, FEV1/FVC 0.81, FEF 25-75% 1.43/69%    . Depression    takes Zoloft daily  . Diabetes mellitus    Type II  . Diverticulosis    TCS 9/08 by Dr. Delfin Edis for diarrhea . Bx for micro scopic colitis negative.   . Fibromyalgia   . Frequent falls   . GERD (gastroesophageal reflux disease)    takes Aciphex daily  . Glaucoma    eye drops daily  . Gum symptoms    infection on antibiotic  . Hemiplegia affecting non-dominant side, post-stroke 08/02/2011  . Hyperlipidemia    takes Crestor daily   . Hypertension    takes Amlodipine,Metoprolol,and Clonidine daily  . Hypothyroidism    takes Synthroid daily  . IBS (irritable bowel syndrome)   . Insomnia    takes Trazodone nightly  . Metabolic encephalopathy 07/19/3265  . Migraines    chronic headaches  . Mononeuritis lower limb   . Osteoporosis   . Pancreatitis 2006   due to Depakote with normal EUS   . Schatzki's ring    non critical / EGD with ED 8/2011with RMR  . Seizures ([place])    takes Lamictal daily.Last seizure 3 yrs ago  . Sleep apnea    on CPAP  . Stroke [facility])    left  sided weakness  . Tubular adenoma of colon    Past Surgical History:  Procedure Laterality Date  . ABDOMINAL HYSTERECTOMY  1978  . BACK SURGERY  July 2012  . BACTERIAL OVERGROWTH TEST N/A 05/05/2013   Procedure: BACTERIAL OVERGROWTH TEST;  Surgeon: Daneil Dolin, MD;  [facility];  Service: Endoscopy;  Laterality: N/A;  7:30  . BIOPSY THYROID  2009  . BRAIN SURGERY  11/2011   resection of meningioma  . BREAST REDUCTION SURGERY  1994  . CARDIAC CATHETERIZATION  05/10/2005   normal coronaries, normal LV systolic function and EF (Dr. Jackie Plum)  . CARPAL TUNNEL RELEASE Left 07/22/04   Dr. Aline Brochure  . CATARACT EXTRACTION Bilateral   . CHOLECYSTECTOMY  1984  . COLONOSCOPY N/A 09/25/2012   IRS:WNIOEVO diverticulosis.  colonic polyp-removed : tubular adenoma  . CRANIOTOMY  11/23/2011   Procedure: CRANIOTOMY TUMOR EXCISION;  Surgeon: Hosie Spangle, MD;  Location: [place] NEURO ORS;  Service: Neurosurgery;  Laterality: N/A;  Craniotomy for tumor resection  . ESOPHAGOGASTRODUODENOSCOPY  12/29/2010   Rourk-Retained food in the esophagus and stomach, small hiatal hernia, status post Maloney dilation of the esophagus  . ESOPHAGOGASTRODUODENOSCOPY N/A 09/25/2012   [id number] atonic baggy esophagus status post Maloney dilation 36 F. Hiatal hernia  . GIVENS CAPSULE STUDY N/A 01/15/2013   NORMAL.   . IR GENERIC HISTORICAL  03/17/2016   IR RADIOLOGIST  EVAL & MGMT 03/17/2016 [facility]-INTERV RAD  . LESION REMOVAL N/A 05/31/2015   Procedure: REMOVAL RIGHT AND LEFT LESIONS OF MANDIBLE;  Surgeon: Diona Browner, DDS;  Location: [place];  Service: Oral Surgery;  Laterality: N/A;  . MALONEY DILATION  12/29/2010   RMR;  . NM MYOCAR PERF WALL MOTION  2006   "relavtiely normal" persantine, mild anterior thinning (breast attenuation artifact), no region of scar/ischemia  . OVARIAN CYST REMOVAL    . RECTOCELE REPAIR N/A 06/29/2015   Procedure: POSTERIOR REPAIR (RECTOCELE);  Surgeon: Jonnie Kind, MD;  Location: [facility];  Service: Gynecology;  Laterality: N/A;  . REDUCTION MAMMAPLASTY Bilateral   . SPINE SURGERY  09/29/2010   Dr. Rolena Infante  . surgical excision of 3 tumors from right thigh and right buttock  and left upper thigh  2010  . TOOTH EXTRACTION Bilateral 12/14/2014   Procedure: REMOVAL OF BILATERAL MANDIBULAR EXOSTOSES;  Surgeon: Diona Browner, DDS;  Location: [place];  Service: Oral Surgery;  Laterality: Bilateral;  . TRANSTHORACIC ECHOCARDIOGRAM  2010   EF 60-65%, mild conc LVH, grade 1 diastolic dysfunction; mildly calcified MV annulus with mildly thickened leaflets, mildly calcified MR annulus   History   Social History  . Marital Status: Divorced    Spouse Name: N/A    Number of Children: 1  . Years of Education: 12   Occupational History  . disabled     Social History Main Topics  . Smoking status: Current Every Day Smoker -- 0.25 packs/day for 7 years    Types: Cigarettes  . Smokeless tobacco: Never Used     Comment: "started back but off now for 3 months" (08/18/13)  . Alcohol Use: No     Comment:    . Drug Use: No   Current Outpatient Prescriptions on File Prior to Visit  Medication Sig Dispense Refill  . albuterol (PROVENTIL HFA;VENTOLIN HFA) 108 (90 Base) MCG/ACT inhaler Inhale 1-2 puffs every 6 hours as needed for wheezing, shortness of breath 3 Inhaler 3  . amLODipine (NORVASC) 5 MG tablet TAKE 2 TABLETS BY MOUTH ONCE  DAILY. 60  tablet 0  . aspirin EC 81 MG tablet Take 81 mg by mouth daily.    . Cholecalciferol (VITAMIN D) 2000 units CAPS Take 1 capsule by mouth daily.    . cloNIDine (CATAPRES) 0.3 MG tablet TAKE 1 TABLET BY MOUTH EVERY EIGHT HOURS. ONCE AT 8AM, 4PM, AND 12 MIDNIGHT. 270 tablet 0  . diclofenac sodium (VOLTAREN) 1 % GEL Apply 2 g topically daily as needed (Pain). 100 g 2  . dicyclomine (BENTYL) 10 MG capsule Take 10 mg by mouth 4 (four) times daily -  before meals and at bedtime.    . docusate sodium (COLACE) 100 MG capsule Take 200 mg by mouth at bedtime.    . fluticasone (FLONASE) 50 MCG/ACT nasal spray Place 2 sprays into both nostrils daily.    . hydroxychloroquine (PLAQUENIL) 200 MG tablet Take 200 mg by mouth 2 (two) times daily.     . Insulin Glargine (TOUJEO SOLOSTAR) 300 UNIT/ML SOPN Inject 25 Units into the skin at bedtime. 6 pen 3  . lamoTRIgine (LAMICTAL) 100 MG tablet Take 1 tablet (100 mg total) by mouth 2 (two) times daily. 60 tablet 5  . levothyroxine (SYNTHROID, LEVOTHROID) 50 MCG tablet TAKE 1 TABLET BY MOUTH ONCE DAILY AND 1/2 TABLET ON SUNDAYS. 30 tablet 0  . LORazepam (ATIVAN) 0.5 MG tablet Take 0.5 tablets (0.25 mg total) by mouth 3 (three) times daily.    [name] losartan (COZAAR) 50 MG tablet TAKE ONE TABLET BY MOUTH DAILY. 90 tablet 1  . lubiprostone (AMITIZA) 24 MCG capsule Take 1 capsule (24 mcg total) by mouth 2 (two) times daily with a meal. 60 capsule 6  . metoprolol tartrate (LOPRESSOR) 50 MG tablet Take 1 tablet (50 mg total) by mouth 2 (two) times daily. Please call and schedule appointment 60 tablet 0  . montelukast (SINGULAIR) 10 MG tablet TAKE ONE TABLET BY MOUTH ONCE DAILY. 90 tablet 0  . Multiple Vitamins-Minerals (ONE-A-DAY 50 PLUS PO) Take 1 tablet by mouth daily.    [name] NOVOLOG FLEXPEN 100 UNIT/ML FlexPen INJECT 16 TO 30 UNITS INTO THE SKIN 3 (THREE) TIMES DAILY WITH MEALS. 90 mL 2  . ondansetron (ZOFRAN ODT) 8 MG disintegrating tablet Take 1 tablet (8 mg total) by mouth  every 8 (eight) hours as needed for nausea or vomiting. 10 tablet 0  . pregabalin (LYRICA) 75 MG capsule Take 1 capsule (75 mg total) by mouth daily.    . RABEprazole (ACIPHEX) 20 MG tablet Take 1 tablet (20 mg total) by mouth 2 (two) times daily. 180 tablet 1  . RESTASIS 0.05 % ophthalmic emulsion Place 1 drop into both eyes 2 (two) times daily.     . risperiDONE (RISPERDAL) 0.5 MG tablet Take 1 tablet (0.5 mg total) by mouth at bedtime. 90 tablet 2  . rosuvastatin (CRESTOR) 5 MG tablet TAKE 1 TABLET BY MOUTH AT BEDTIME. 90 tablet 0  . sertraline (ZOLOFT) 100 MG tablet Take 1 tablet (100 mg total) by mouth daily.    . traZODone (DESYREL) 100 MG tablet Take 1 tablet (100 mg total) by mouth at bedtime.     No current facility-administered medications on file prior to visit.       Allergies  Allergen Reactions  . Cephalexin Hives  . Iron Nausea And Vomiting  . Milk-Related Compounds Other (See Comments)    Doesn't agree with stomach.   . Penicillins Hives       . Phenazopyridine Hcl Hives  Family History  Problem Relation Age of Onset  . Heart attack Mother        HTN  . Pneumonia Father   . Kidney failure Father   . Diabetes Father   . Pancreatic cancer Sister   . Diabetes Brother   . Hypertension Brother   . Diabetes Brother   . Cancer Sister        breast   . Hypertension Son   . Sleep apnea Son   . Cancer Sister        pancreatic  . Stroke Maternal Grandmother   . Heart attack Maternal Grandfather   . Alcohol abuse Maternal Uncle   . Colon cancer Neg Hx   . Anesthesia problems Neg Hx   . Hypotension Neg Hx   . Malignant hyperthermia Neg Hx   . Pseudochol deficiency Neg Hx    PE: BP (!) 110/52 (BP Location: Left Arm, Patient Position: Sitting)   Pulse 72   Ht 5' (1.524 m)   Wt 147 lb (66.7 kg)   SpO2 97%   BMI 28.71 kg/m  Body mass index is 28.71 kg/m.  Wt Readings from Last 3 Encounters:  11/06/16 147 lb (66.7 kg)  11/02/16 146 lb (66.2 kg)   10/31/16 147 lb (66.7 kg)   Constitutional: overweight, in NAD Eyes: PERRLA, EOMI, no exophthalmos ENT: moist mucous membranes, no thyromegaly, + large thyroid nodule felt in isthmus, no cervical lymphadenopathy Cardiovascular: RRR, No MRG Respiratory: CTA B Gastrointestinal: abdomen soft, NT, ND, BS+ Musculoskeletal: no deformities, strength intact in all 4 Skin: moist, warm, no rashes Neurological: no tremor with outstretched hands, DTR normal in all 4   ASSESSMENT: 1. DM2, insulin-dependent, uncontrolled, without complications - gastroparesis - cerebrovasc. Ds -  H/o stroke - PN  She was interested in an insulin pump >> discussed about [employer] (given brochure) at a previous visit.  2. Hypothyroidism  3. MNG - Thyroid U/S (10/11/2012):  Right thyroid lobe: 3.7 x 1.0 x 1.8 cm  Left thyroid lobe: 3.5 x 1.5 x 1.6 cm  Isthmus: 0.5 cm  Focal nodules: There is a 0.3 mm calcified nodule in the right midzone. There is a 0.6 x 0.7 x 0.5 cm hypoechoic nodule in the lateral aspect of the right midzone. There is a 0.7 x 0.6 x 0.5 cm hyperechoic nodule in the lateral aspect of the right lower pole. There is a 2.2 x 1.1 x 1.7 cm complex solid nodule in the inferior aspect of the isthmus extending adjacent to the left lower pole. There is a 1.4 x 2.1 x 1.4 cm complex solid nodule in the left lower pole.  Lymphadenopathy: None visualized.  IMPRESSION: Multi nodular goiter which has markedly progressed since the prior exam. The dominant nodule at the inferior aspect of the isthmus fits criteria for fine needle aspiration biopsy if not previously Assessed.  - FNA (11/05/2012) x2: benign  - Thyroid U/S (01/20/2014) - felt one nodule enlarging >> new U/S: stable appearance of the nodules, except a small new nodule in isthmus: 1.2 cm   Right thyroid lobe Measurements: 4.4 cm x 1.2 cm x 1.7 cm. Multiple right-sided nodules identified. Each right-sided nodule demonstrates  increased echogenicity, with the superior measuring 7 mm -8 mm, most inferior measuring 9 mm - 10 mm. A small, 3 mm focus of calcium with posterior shadowing is evident. There is also a mid nodule measuring 7 mm, which is echogenic.  Left thyroid lobe Measurements: 5.1 cm x 2.1 cm x 2.3  cm. Dominant lesion at the inferior pole of left thyroid is again evident, which has been previously biopsied (10/29/2012). This nodule measures 1.8 cm x 1.7cm x 2.5 cm. (Previous 1.4 cm x 2.1 cm x 1.4 cm)  Isthmus Thickness: 4 mm-5 mm. Isthmic nodule again noted, previously biopsied (10/29/2012). Currently this nodule measures 2.2 cm x 1.4 cm x 1.8 cm. (previous measurement 2.2 cm x 1.1 cm x 1.7 cm). There is a new nodule identified within the isthmus with heterogeneously hyperechoic characteristics. This nodule measures 12 mm x 5.4 mm x 7.2 mm.  Lymphadenopathy: None visualized.  - Thyroid U/S (11/09/2015): Details   Reading Physician Reading Date Result Priority  Sandi Mariscal, MD 11/09/2015   Narrative    CLINICAL DATA: Follow-up thyroid nodules.  EXAM: THYROID ULTRASOUND  TECHNIQUE: Ultrasound examination of the thyroid gland and adjacent soft tissues was performed.  COMPARISON: Thyroid ultrasound - 01/20/2014 ; 10/11/2012  FINDINGS: Parenchymal Echotexture: Moderately heterogenous  Estimated total number of nodules > 1 cm: <5  Number of spongiform nodules > 2 cm not described below (TR1): 0  Number of mixed cystic nodules > 1.5 cm not described below ([place]): 0  _________________________________________________________  Isthmus: 0.7 cm  Nodule # 1:  Prior biopsy: No  Location: Isthmus; left  Size: 1.5 x 1.3 x 1.5 cm, previously 2.2 x 1.4 x 1.8 cm  Composition: solid/almost completely solid (2)  Echogenicity: hyperechoic (1)  Shape: not taller-than-wide (0)  Margins: ill-defined (0)  Echogenic foci: none (0)  ACR TI-RADS total points: 3.  ACR TI-RADS risk category: TR3 (3  points).  Change in features: Yes. Increased internal cystic degeneration and smaller in size.  Change in ACR TI-RADS risk category: No  ACR TI-RADS recommendations:  *Given size (1.5 - 2.4 cm) and appearance, a follow-up ultrasound in 1 year is recommended based on TI-RADS criteria as clinically indicated.  _________________________________________________________  Right lobe: 4.6 x 1.1 x 1.9 cm, previously 4.4 x 1.2 x 1.7 cm  Stable scattered subcentimeter echogenic nodules  _________________________________________________________  Left lobe: 4.8 x 2.1 x 1.9 cm, previously 5.1 x 2.1 x 2.3 cm  Nodule # 1:  Prior biopsy: No  Location: Left; Inferior  Size: 2.4 x 1.7 x 1.9 cm, previously 2.5 x 1.7 x 1.7  Composition: solid/almost completely solid (2)  Echogenicity: hyperechoic (1)  Shape: not taller-than-wide (0)  Margins: ill-defined (0)  Echogenic foci: none (0)  ACR TI-RADS total points: 3.  ACR TI-RADS risk category: TR3 (3 points).  Change in features: No  Change in ACR TI-RADS risk category: No  ACR TI-RADS recommendations:  *Given size (1.5 - 2.4 cm) and appearance, a follow-up ultrasound in 1 year is recommended based on TI-RADS criteria as clinically indicated.  IMPRESSION: TR 3 isthmic and left thyroid nodules unchanged. *Given size (1.5 - 2.4 cm) and appearance of both thyroid nodules, a follow-up ultrasound in 1 year is recommended based on TI-RADS criteria as clinically indicated.  The above is in keeping with the ACR TI-RADS recommendations - J Am Coll Radiol 2017;14:587-595.   Electronically Signed By: Sandi Mariscal M.D. On: 11/09/2015 19:10       None of the nodules have enlarged or show concerning features. Since they have been biopsied before, will continue following them clinically and by ultrasound.  PLAN:  1. Patient with long-standing, uncontrolled diabetes, on basal-bolus regimen, with previously very good control >>  latest hbA1c from earlier this mo >> reviewed >> great, at 6.3%! However, her sugars are now higher  after starting prednisone (now off) so would not change the regimen but at next visit, if sugars improve, we may need to decrease the doses. - I advised her to:  Patient Instructions  Please continue: - Toujeo 30 units at bedtime - Novolog:  17 units with a smaller meal  24 units with a larger meal or with lunch  Please return in 4 months with your sugar log.   - continue checking sugars at different times of the day - check 3x a day, rotating checks - advised for yearly eye exams >> she is UTD - Return to clinic in 3 mo with sugar log   2. Hypothyroidism - On LT4 50 mcg 6/7 days and 25 mcg 1/7 days - latest thyroid labs reviewed with pt >> normal earlier this mo - she continues on LT4 mcg daily - pt feels good on this dose. - we discussed about taking the thyroid hormone every day, with water, >30 minutes before breakfast, separated by >4 hours from acid reflux medications, calcium, iron, multivitamins. Pt. is taking it correctly  3. MNG - She previously had 2 normal biopsies (2014). - Reviewed her imaging tests: the ultrasounds from 2014, 2015, 2017 >> stable nodules. - She has a history of mild dysphagia , but no external Es compression per Ba swallow from 08/2014  -  she did have some esophageal dysmotility  and is multiple esophageal dilationss/p  - No ultrasound needed unless her dysphagia increases  Philemon Kingdom, MD PhD [facility]

## 2016-11-06 NOTE — Telephone Encounter (Signed)
I have spoken with [name] this morning, and per CKW, advised Lamictal level is therapeutic; no changes to dose.  She verbalized understanding of same/fim

## 2016-11-06 NOTE — Telephone Encounter (Signed)
-----   Message from Kathrynn Ducking, MD sent at 11/05/2016  3:51 PM EDT ----- Lamictal level is therapeutic, no change in dosing. Please call the patient. ----- Message ----- From: Lavone Neri Lab Results In Sent: 11/04/2016   5:41 AM To: Kathrynn Ducking, MD

## 2016-11-06 NOTE — Patient Instructions (Addendum)
Please continue: - Toujeo 30 units at bedtime - Novolog:  17 units with a smaller meal  24 units with a larger meal or with lunch  Please return in 3 months with your sugar log.

## 2016-11-07 DIAGNOSIS — I69354 Hemiplegia and hemiparesis following cerebral infarction affecting left non-dominant side: Secondary | ICD-10-CM | POA: Diagnosis not present

## 2016-11-07 DIAGNOSIS — I110 Hypertensive heart disease with heart failure: Secondary | ICD-10-CM | POA: Diagnosis not present

## 2016-11-07 DIAGNOSIS — F250 Schizoaffective disorder, bipolar type: Secondary | ICD-10-CM | POA: Diagnosis not present

## 2016-11-07 DIAGNOSIS — J449 Chronic obstructive pulmonary disease, unspecified: Secondary | ICD-10-CM | POA: Diagnosis not present

## 2016-11-07 DIAGNOSIS — Z9181 History of falling: Secondary | ICD-10-CM | POA: Diagnosis not present

## 2016-11-07 DIAGNOSIS — E119 Type 2 diabetes mellitus without complications: Secondary | ICD-10-CM | POA: Diagnosis not present

## 2016-11-07 DIAGNOSIS — F17210 Nicotine dependence, cigarettes, uncomplicated: Secondary | ICD-10-CM | POA: Diagnosis not present

## 2016-11-07 DIAGNOSIS — I509 Heart failure, unspecified: Secondary | ICD-10-CM | POA: Diagnosis not present

## 2016-11-07 DIAGNOSIS — G40909 Epilepsy, unspecified, not intractable, without status epilepticus: Secondary | ICD-10-CM | POA: Diagnosis not present

## 2016-11-09 ENCOUNTER — Encounter (HOSPITAL_COMMUNITY): Payer: Self-pay | Admitting: Psychiatry

## 2016-11-09 ENCOUNTER — Ambulatory Visit (INDEPENDENT_AMBULATORY_CARE_PROVIDER_SITE_OTHER): Payer: Medicare HMO | Admitting: Psychiatry

## 2016-11-09 DIAGNOSIS — F331 Major depressive disorder, recurrent, moderate: Secondary | ICD-10-CM | POA: Diagnosis not present

## 2016-11-09 NOTE — Progress Notes (Signed)
Patient:  [name]               DOB: 10/16/1950          MR Number: [id number]  Location: [place]:  [address] [place],  [place],   [place]  Start: Thursday 11/09/2016 10:12 AM End:       Thursday 11/09/2016 11:00 AM   Provider/Observer:     Maurice Small, MSW, LCSW   Chief Complaint:                                         Depression  Reason For Service:               [name] is a 66 y.o. female who presents with a long standing history of recurrent periods of depression beginning when she was 68 and her favorite uncle died. She has been experiencing increased symptoms of depression recently due to stress in the relationship with her son. Per patient's report, he rarely visits her anymore due to his involvement with an older woman who is controlling. She also reports frustration regarding her knees who is her power of attorn      ey and is very demanding per      patient's report. She reports issues with other family members and states she needs help dealing with her family Patient reports multiple psychiatric hospitlaizations due to depression and suicidal ideations with thel last one occuring in 1997. Patient has participated in outpatient psychotherapy and medication management intermittently since age 18.  She currently is seeing psychiatrist Dr. Harrington [name] . Prior to this, she was being seen at [facility]. Patient also has had ECT at [facility].    Interventions Strategy:  Supportive  Participation Level:   Active      Participation Quality:  Appropriate      Behavioral Observation:  Casual, well groomed, drowsy, speech very slow   Current Psychosocial Factors: Tension in relationship with son, multiple health issues, concerns about grandson  Content of Session:   reviewed symptoms, discussed stressors, facilitated expression of thoughts and feelings regarding stressors, praise and reinforced patient's use of assertiveness skills and her efforts to respect  boundaries in her relationship with her son, discussed patient's concerns regarding her fears of becoming disconnected from her church family, assisted patient identify ways to maintain connection    Current Status:   improved mood, absence of irritability, decreased worry  Suicidal/Homicidal:    No   Patient Progress:   Good. Patient reports much improved mood and decreased irritability since last session. She continues to express frustration regarding her GI issues. She is working with her doctor regarding these issues. However, she has been unable to attend church for the past few weeks and fears she will become disconnected from her church family. She still expresses concern about the relationship between her son and grandson but expresses increased acceptance. She is focusing more on the areas she can change. Patient continues to have strong support from her church family and friends. She also reports trying to maintain some involvement in activity and reports physical therapy has been helpful.  Target Goals:   1.  Learn and implement behavioral strategies to overcome depression.    2.  Verbalize an understanding and resolution of current interpersonal problems.   Last Reviewed:   10/13/2016  Goals Addressed Today:  2  Plan:      Return again in 2 - 3 weeks.  Impression/Diagnosis:   Patient presents with long standing history of recurrent periods  of depression beginning when she was [age] and her favorite uncle died. Patient reports multiple psychiatric hospitlaizations due to depression and suicidal ideations with thel last one occuring in 1997. Patient has participated in outpatient psychotherapy and medication management intermittently since age 75.  She currently is seeing psychiatrist Dr. Harrington [name] . Prior to this, she was being seen at [facility]. Patient also has had ECT at [facility]. Symptoms have worsened in recent months due to family stress. Current symptoms include depressed mood,  anxiety, excessive worry, and tearfulness.   Diagnosis:  Axis I: MDD recurrent episode, moderate          Axis II: Deferred     [name] Malachi, LCSW 11/09/2016

## 2016-11-10 DIAGNOSIS — I509 Heart failure, unspecified: Secondary | ICD-10-CM | POA: Diagnosis not present

## 2016-11-10 DIAGNOSIS — F17210 Nicotine dependence, cigarettes, uncomplicated: Secondary | ICD-10-CM | POA: Diagnosis not present

## 2016-11-10 DIAGNOSIS — G40909 Epilepsy, unspecified, not intractable, without status epilepticus: Secondary | ICD-10-CM | POA: Diagnosis not present

## 2016-11-10 DIAGNOSIS — Z9181 History of falling: Secondary | ICD-10-CM | POA: Diagnosis not present

## 2016-11-10 DIAGNOSIS — E119 Type 2 diabetes mellitus without complications: Secondary | ICD-10-CM | POA: Diagnosis not present

## 2016-11-10 DIAGNOSIS — I110 Hypertensive heart disease with heart failure: Secondary | ICD-10-CM | POA: Diagnosis not present

## 2016-11-10 DIAGNOSIS — J449 Chronic obstructive pulmonary disease, unspecified: Secondary | ICD-10-CM | POA: Diagnosis not present

## 2016-11-10 DIAGNOSIS — F250 Schizoaffective disorder, bipolar type: Secondary | ICD-10-CM | POA: Diagnosis not present

## 2016-11-10 DIAGNOSIS — I69354 Hemiplegia and hemiparesis following cerebral infarction affecting left non-dominant side: Secondary | ICD-10-CM | POA: Diagnosis not present

## 2016-11-12 ENCOUNTER — Encounter (HOSPITAL_COMMUNITY): Payer: Self-pay | Admitting: Emergency Medicine

## 2016-11-12 ENCOUNTER — Emergency Department (HOSPITAL_COMMUNITY)
Admission: EM | Admit: 2016-11-12 | Discharge: 2016-11-12 | Disposition: A | Discharge status: Home / Self Care | Payer: Medicare HMO | Attending: Emergency Medicine | Admitting: Emergency Medicine

## 2016-11-12 ENCOUNTER — Emergency Department (HOSPITAL_COMMUNITY): Payer: Medicare HMO

## 2016-11-12 DIAGNOSIS — Z79899 Other long term (current) drug therapy: Secondary | ICD-10-CM | POA: Insufficient documentation

## 2016-11-12 DIAGNOSIS — F1110 Opioid abuse, uncomplicated: Secondary | ICD-10-CM | POA: Diagnosis not present

## 2016-11-12 DIAGNOSIS — J449 Chronic obstructive pulmonary disease, unspecified: Secondary | ICD-10-CM | POA: Insufficient documentation

## 2016-11-12 DIAGNOSIS — F1910 Other psychoactive substance abuse, uncomplicated: Secondary | ICD-10-CM

## 2016-11-12 DIAGNOSIS — Z794 Long term (current) use of insulin: Secondary | ICD-10-CM | POA: Insufficient documentation

## 2016-11-12 DIAGNOSIS — F1310 Sedative, hypnotic or anxiolytic abuse, uncomplicated: Secondary | ICD-10-CM | POA: Diagnosis not present

## 2016-11-12 DIAGNOSIS — E039 Hypothyroidism, unspecified: Secondary | ICD-10-CM | POA: Diagnosis not present

## 2016-11-12 DIAGNOSIS — R031 Nonspecific low blood-pressure reading: Secondary | ICD-10-CM | POA: Diagnosis not present

## 2016-11-12 DIAGNOSIS — F17210 Nicotine dependence, cigarettes, uncomplicated: Secondary | ICD-10-CM | POA: Insufficient documentation

## 2016-11-12 DIAGNOSIS — E860 Dehydration: Secondary | ICD-10-CM | POA: Insufficient documentation

## 2016-11-12 DIAGNOSIS — Z7982 Long term (current) use of aspirin: Secondary | ICD-10-CM | POA: Insufficient documentation

## 2016-11-12 DIAGNOSIS — E1142 Type 2 diabetes mellitus with diabetic polyneuropathy: Secondary | ICD-10-CM | POA: Diagnosis not present

## 2016-11-12 DIAGNOSIS — R531 Weakness: Secondary | ICD-10-CM | POA: Diagnosis not present

## 2016-11-12 DIAGNOSIS — M6281 Muscle weakness (generalized): Secondary | ICD-10-CM | POA: Diagnosis present

## 2016-11-12 DIAGNOSIS — R404 Transient alteration of awareness: Secondary | ICD-10-CM | POA: Diagnosis not present

## 2016-11-12 DIAGNOSIS — A419 Sepsis, unspecified organism: Secondary | ICD-10-CM | POA: Diagnosis not present

## 2016-11-12 LAB — CBC WITH DIFFERENTIAL/PLATELET
BASOS ABS: 0 10*3/uL (ref 0.0–0.1)
Basophils Relative: 0 %
EOS PCT: 3 %
Eosinophils Absolute: 0.2 10*3/uL (ref 0.0–0.7)
HCT: 34.9 % — ABNORMAL LOW (ref 36.0–46.0)
Hemoglobin: 10.6 g/dL — ABNORMAL LOW (ref 12.0–15.0)
LYMPHS ABS: 2.4 10*3/uL (ref 0.7–4.0)
LYMPHS PCT: 35 %
MCH: 22.9 pg — AB (ref 26.0–34.0)
MCHC: 30.4 g/dL (ref 30.0–36.0)
MCV: 75.5 fL — AB (ref 78.0–100.0)
Monocytes Absolute: 0.6 10*3/uL (ref 0.1–1.0)
Monocytes Relative: 8 %
Neutro Abs: 3.6 10*3/uL (ref 1.7–7.7)
Neutrophils Relative %: 54 %
PLATELETS: 171 10*3/uL (ref 150–400)
RBC: 4.62 MIL/uL (ref 3.87–5.11)
RDW: 14.9 % (ref 11.5–15.5)
WBC: 6.8 10*3/uL (ref 4.0–10.5)

## 2016-11-12 LAB — COMPREHENSIVE METABOLIC PANEL WITH GFR
ALT: 31 U/L (ref 14–54)
AST: 47 U/L — ABNORMAL HIGH (ref 15–41)
Albumin: 3.9 g/dL (ref 3.5–5.0)
Alkaline Phosphatase: 93 U/L (ref 38–126)
Anion gap: 9 (ref 5–15)
BUN: 20 mg/dL (ref 6–20)
CO2: 22 mmol/L (ref 22–32)
Calcium: 9.4 mg/dL (ref 8.9–10.3)
Chloride: 109 mmol/L (ref 101–111)
Creatinine, Ser: 1.35 mg/dL — ABNORMAL HIGH (ref 0.44–1.00)
GFR calc Af Amer: 46 mL/min — ABNORMAL LOW (ref >60)
GFR calc non Af Amer: 40 mL/min — ABNORMAL LOW (ref >60)
Glucose, Bld: 88 mg/dL (ref 65–99)
Potassium: 4.3 mmol/L (ref 3.5–5.1)
Sodium: 140 mmol/L (ref 135–145)
Total Bilirubin: 0.8 mg/dL (ref 0.3–1.2)
Total Protein: 7.2 g/dL (ref 6.5–8.1)

## 2016-11-12 LAB — I-STAT CG4 LACTIC ACID, ED: Lactic Acid, Venous: 2.23 mmol/L (ref 0.5–1.9)

## 2016-11-12 LAB — RAPID URINE DRUG SCREEN, HOSP PERFORMED
AMPHETAMINES: NOT DETECTED
Barbiturates: NOT DETECTED
Benzodiazepines: POSITIVE — AB
Cocaine: NOT DETECTED
OPIATES: POSITIVE — AB
Tetrahydrocannabinol: NOT DETECTED

## 2016-11-12 LAB — URINALYSIS, ROUTINE W REFLEX MICROSCOPIC
Bilirubin Urine: NEGATIVE
Glucose, UA: NEGATIVE mg/dL
Hgb urine dipstick: NEGATIVE
Ketones, ur: NEGATIVE mg/dL
Leukocytes, UA: NEGATIVE
Nitrite: NEGATIVE
Protein, ur: NEGATIVE mg/dL
Specific Gravity, Urine: 1.015 (ref 1.005–1.030)
pH: 5 (ref 5.0–8.0)

## 2016-11-12 LAB — CBG MONITORING, ED: Glucose-Capillary: 73 mg/dL (ref 65–99)

## 2016-11-12 MED ORDER — SODIUM CHLORIDE 0.9 % IV BOLUS (SEPSIS)
1000.0000 mL | Freq: Once | INTRAVENOUS | Status: AC
  Administered 2016-11-12: 1000 mL via INTRAVENOUS

## 2016-11-12 MED ORDER — NALOXONE HCL 2 MG/2ML IJ SOSY
2.0000 mg | PREFILLED_SYRINGE | Freq: Once | INTRAMUSCULAR | Status: AC
  Administered 2016-11-12: 2 mg via INTRAVENOUS
  Filled 2016-11-12: qty 2

## 2016-11-12 MED ORDER — VANCOMYCIN HCL IN DEXTROSE 1-5 GM/200ML-% IV SOLN
1000.0000 mg | Freq: Once | INTRAVENOUS | Status: AC
Start: 2016-11-12 — End: 2016-11-12
  Administered 2016-11-12: 1000 mg via INTRAVENOUS
  Filled 2016-11-12: qty 200

## 2016-11-12 MED ORDER — AZTREONAM 2 G IJ SOLR
INTRAMUSCULAR | Status: AC
  Filled 2016-11-12: qty 2

## 2016-11-12 MED ORDER — SODIUM CHLORIDE 0.9 % IV BOLUS (SEPSIS)
2000.0000 mL | Freq: Once | INTRAVENOUS | Status: AC
  Administered 2016-11-12: 2000 mL via INTRAVENOUS

## 2016-11-12 MED ORDER — AZTREONAM 2 G IJ SOLR: 2.0000 g | Freq: Once | INTRAMUSCULAR | Status: DC

## 2016-11-12 MED ORDER — DEXTROSE 5 % IV SOLN
2.0000 g | Freq: Once | INTRAVENOUS | Status: AC
  Administered 2016-11-12: 2 g via INTRAVENOUS

## 2016-11-12 NOTE — ED Notes (Signed)
Pt ambulated in the room, steady gait, no distress while ambulated.  Pt states she is ready to go home.  edp notified

## 2016-11-12 NOTE — ED Triage Notes (Signed)
Pt fell at home, was slide to floor by caregiver.  Pt is in and out of sleep on arrival to ed.  Will awaken to name being called. bg was 117 and bp 60-70's sys on scene per ems

## 2016-11-12 NOTE — ED Notes (Signed)
Pt became more alert after narcan administration.  Pt awake and talking to staff.  A&o x 4

## 2016-11-12 NOTE — Discharge Instructions (Signed)
Decrease the amount of pain medicine you take and drink plenty of fluids.   Follow up with your md this week for recheck

## 2016-11-12 NOTE — ED Provider Notes (Signed)
[facility] DEPT Provider Note   CSN: [id number] Arrival date & time: 11/12/16  1157     History   Chief Complaint Chief Complaint  Patient presents with  . Weakness    HPI [name] is a 66 y.o. female.  Patient was brought in by EMS for lethargy. Patient was recently admitted to the hospital with pneumonia and was on a ventilator.   The history is provided by the EMS personnel. No language interpreter was used.  Weakness  Primary symptoms include no focal weakness. This is a recurrent problem. The problem has not changed since onset.There was no focality noted. There has been no fever. Pertinent negatives include no shortness of breath. There were no medications administered prior to arrival. Associated medical issues do not include trauma.    Past Medical History:  Diagnosis Date  . Anemia   . Anxiety    takes Ativan daily  . Arthritis   . Bipolar disorder ([place])    takes Risperdal nightly  . Blood transfusion   . Cancer ([place])    In her gum  . Carpal tunnel syndrome of right wrist 05/23/2011  . Cervical disc disorder with radiculopathy of cervical region 10/31/2012  . Chronic back pain   . Chronic idiopathic constipation   . Colon polyps   . COPD (chronic obstructive pulmonary disease) with chronic bronchitis ([place]) 09/16/2013   Office Spirometry 10/30/2013-submaximal effort based on appearance of loop and curve. Numbers would fit with severe restriction but her physiologic capability may be better than this. FVC 0.91/44%, and 10.74/45%, FEV1/FVC 0.81, FEF 25-75% 1.43/69%    . Depression    takes Zoloft daily  . Diabetes mellitus    Type II  . Diverticulosis    TCS 9/08 by Dr. Delfin Edis for diarrhea . Bx for micro scopic colitis negative.   . Fibromyalgia   . Frequent falls   . GERD (gastroesophageal reflux disease)    takes Aciphex daily  . Glaucoma    eye drops daily  . Gum symptoms    infection on antibiotic  . Hemiplegia affecting non-dominant side,  post-stroke 08/02/2011  . Hyperlipidemia    takes Crestor daily  . Hypertension    takes Amlodipine,Metoprolol,and Clonidine daily  . Hypothyroidism    takes Synthroid daily  . IBS (irritable bowel syndrome)   . Insomnia    takes Trazodone nightly  . Metabolic encephalopathy 6/96/7893  . Migraines    chronic headaches  . Mononeuritis lower limb   . Osteoporosis   . Pancreatitis 2006   due to Depakote with normal EUS   . Schatzki's ring    non critical / EGD with ED 8/2011with RMR  . Seizures ([place])    takes Lamictal daily.Last seizure 3 yrs ago  . Sleep apnea    on CPAP  . Stroke [facility])    left sided weakness  . Tubular adenoma of colon     Patient Active Problem List   Diagnosis Date Noted  . Acute respiratory failure with hypoxia ([place])   . HCAP (healthcare-associated pneumonia)/Aspiration PNA 10/16/2016  . Acute respiratory failure with hypercapnia ([place]) 10/15/2016  . Acute metabolic encephalopathy 81/03/7508  . AKI (acute kidney injury) ([place]) 10/15/2016  . Hypotension   . SBO (small bowel obstruction) ([place]) 09/10/2016  . Diabetic polyneuropathy associated with type 2 diabetes mellitus ([place]) 05/19/2016  . Smoker 04/24/2016  . Class 1 obesity with serious comorbidity and body mass index (BMI) of 33.0 to 33.9 in adult 04/24/2016  .  Ulcer of toe, left, limited to breakdown of skin ([place]) 04/18/2016  . History of palpitations 08/09/2015  . Nausea without vomiting 08/09/2015  . Labile hypertension 08/03/2015  . Normal coronary arteries 08/03/2015  . Left-sided low back pain with left-sided sciatica 06/27/2015  . Left flank pain 06/27/2015  . Diabetes mellitus type 2 in obese ([place]) 05/06/2015  . Multinodular goiter 05/06/2015  . Rectocele, female 04/27/2015  . Anal sphincter incontinence 04/27/2015  . Pelvic relaxation due to rectocele 03/30/2015  . Hospital discharge follow-up 03/02/2015  . Pulmonary hypertension ([place]) 02/22/2015  . Weakness of left upper extremity  02/21/2015  . Light cigarette smoker (1-9 cigarettes per day) 01/11/2015  . Migraine without aura and without status migrainosus, not intractable 07/02/2014  . Flatulence 02/18/2014  . Microcytic anemia 02/18/2014  . COPD (chronic obstructive pulmonary disease) with chronic bronchitis ([place]) 09/16/2013  . Hypothyroidism 08/16/2013  . Gastroparesis 04/28/2013  . Seizure disorder ([place]) 01/19/2013  . Cervical disc disorder with radiculopathy of cervical region 10/31/2012  . Solitary pulmonary nodule 08/19/2012  . Anemia 07/05/2012  . 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 Allergic sinusitis 04/18/2012  . Meningioma ([place]) 11/19/2011  . 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 Acute renal failure ([place]) 08/02/2011  . Hemiplegia and hemiparesis following cerebral infarction affecting unspecified side ([place]) 08/02/2011  . Carpal tunnel syndrome of right wrist 05/23/2011  . Polypharmacy 04/28/2011  . Bipolar disorder ([place]) 04/28/2011  . Altered mental status 04/27/2011  . Constipation 04/13/2011  . Falls frequently 12/12/2010  . Oropharyngeal dysphagia 07/12/2010  . Urinary incontinence 12/16/2009  . HEARING LOSS 10/26/2009  . Hyperlipidemia 12/11/2008  . IBS 12/11/2008  . GERD 07/29/2008  . MILK PRODUCTS ALLERGY 07/29/2008  . 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 Backache 06/19/2007  . Osteoporosis 06/19/2007  . Obstructive sleep apnea 06/19/2007  . TRIGGER FINGER 04/18/2007  . DIVERTICULOSIS, COLON 11/13/2006    Past Surgical History:  Procedure Laterality Date  . ABDOMINAL HYSTERECTOMY  1978  . BACK SURGERY  July 2012  . BACTERIAL OVERGROWTH TEST N/A 05/05/2013   Procedure: BACTERIAL OVERGROWTH TEST;  Surgeon: Daneil Dolin, MD;  Location: [facility] ENDO SUITE;  Service: Endoscopy;  Laterality: N/A;  7:30  . BIOPSY THYROID  2009  . BRAIN SURGERY  11/2011   resection of meningioma  . BREAST REDUCTION SURGERY  1994  . CARDIAC  CATHETERIZATION  05/10/2005   normal coronaries, normal LV systolic function and EF (Dr. Jackie Plum)  . CARPAL TUNNEL RELEASE Left 07/22/04   Dr. Aline Brochure  . CATARACT EXTRACTION Bilateral   . CHOLECYSTECTOMY  1984  . COLONOSCOPY N/A 09/25/2012   EGB:TDVVOHY diverticulosis.  colonic polyp-removed : tubular adenoma  . CRANIOTOMY  11/23/2011   Procedure: CRANIOTOMY TUMOR EXCISION;  Surgeon: Hosie Spangle, MD;  Location: [facility] NEURO ORS;  Service: Neurosurgery;  Laterality: N/A;  Craniotomy for tumor resection  . ESOPHAGOGASTRODUODENOSCOPY  12/29/2010   Rourk-Retained food in the esophagus and stomach, small hiatal hernia, status post Maloney dilation of the esophagus  . ESOPHAGOGASTRODUODENOSCOPY N/A 09/25/2012   WVP:XTGGYIRS atonic baggy esophagus status post Maloney dilation 57 F. Hiatal hernia  . GIVENS CAPSULE STUDY N/A 01/15/2013   NORMAL.   . IR GENERIC HISTORICAL  03/17/2016   IR RADIOLOGIST EVAL & MGMT 03/17/2016 [facility]-INTERV RAD  . LESION REMOVAL N/A 05/31/2015   Procedure: REMOVAL RIGHT AND LEFT LESIONS OF MANDIBLE;  Surgeon: Diona Browner, DDS;  Location: [facility];  Service: Oral Surgery;  Laterality: N/A;  . MALONEY DILATION  12/29/2010   RMR;  . NM MYOCAR PERF WALL MOTION  2006   "relavtiely normal" persantine, mild anterior thinning (breast attenuation artifact), no region of scar/ischemia  . OVARIAN CYST REMOVAL    . RECTOCELE REPAIR N/A 06/29/2015   Procedure: POSTERIOR REPAIR (RECTOCELE);  Surgeon: Jonnie Kind, MD;  Location: [facility] ORS;  Service: Gynecology;  Laterality: N/A;  . REDUCTION MAMMAPLASTY Bilateral   . SPINE SURGERY  09/29/2010   Dr. Rolena Infante  . surgical excision of 3 tumors from right thigh and right buttock  and left upper thigh  2010  . TOOTH EXTRACTION Bilateral 12/14/2014   Procedure: REMOVAL OF BILATERAL MANDIBULAR EXOSTOSES;  Surgeon: Diona Browner, DDS;  Location: [facility];  Service: Oral Surgery;  Laterality: Bilateral;  . TRANSTHORACIC ECHOCARDIOGRAM  2010   EF 60-65%,  mild conc LVH, grade 1 diastolic dysfunction; mildly calcified MV annulus with mildly thickened leaflets, mildly calcified MR annulus    OB History    Gravida Para Term Preterm AB Living   6 1 1   5      SAB TAB Ectopic Multiple Live Births   5               Home Medications    Prior to Admission medications   Medication Sig Start Date End Date Taking? Authorizing Provider  albuterol (PROVENTIL HFA;VENTOLIN HFA) 108 (90 Base) MCG/ACT inhaler Inhale 1-2 puffs every 6 hours as needed for wheezing, shortness of breath 07/27/16  Yes Young, Clinton D, MD  amLODipine (NORVASC) 5 MG tablet TAKE 2 TABLETS BY MOUTH ONCE DAILY. 11/02/16  Yes Philemon Kingdom, MD  aspirin EC 81 MG tablet Take 81 mg by mouth daily.   Yes [provider]  Cholecalciferol (VITAMIN D) 2000 units CAPS Take 1 capsule by mouth daily.   Yes [provider]  cloNIDine (CATAPRES) 0.3 MG tablet TAKE 1 TABLET BY MOUTH EVERY EIGHT HOURS. ONCE AT 8AM, 4PM, AND 12 MIDNIGHT. 08/30/16  Yes [name], MD  diclofenac sodium (VOLTAREN) 1 % GEL Apply 2 g topically daily as needed (Pain). 07/21/16  Yes [name], MD  dicyclomine (BENTYL) 10 MG capsule Take 10 mg by mouth 4 (four) times daily -  before meals and at bedtime.   Yes [provider]  docusate sodium (COLACE) 100 MG capsule Take 200 mg by mouth at bedtime.   Yes [provider]  fluticasone (FLONASE) 50 MCG/ACT nasal spray Place 2 sprays into both nostrils daily.   Yes [provider]  hydroxychloroquine (PLAQUENIL) 200 MG tablet Take 200 mg by mouth 2 (two) times daily.    Yes [provider]  insulin aspart (NOVOLOG FLEXPEN) 100 UNIT/ML FlexPen INJECT 17 TO 24 UNITS INTO THE SKIN 3 (THREE) TIMES DAILY WITH MEALS. 11/06/16  Yes Philemon Kingdom, MD  Insulin Glargine (TOUJEO SOLOSTAR) 300 UNIT/ML SOPN Inject 30 Units into the skin at bedtime. 11/06/16  Yes Philemon Kingdom, MD  lamoTRIgine (LAMICTAL) 100 MG  tablet Take 1 tablet (100 mg total) by mouth 2 (two) times daily. 08/30/16  Yes Jaffe, Adam R, DO  levothyroxine (SYNTHROID, LEVOTHROID) 50 MCG tablet TAKE 1 TABLET BY MOUTH ONCE DAILY AND 1/2 TABLET ON SUNDAYS. 05/02/16  Yes Philemon Kingdom, MD  LORazepam (ATIVAN) 0.5 MG tablet Take 0.5 tablets (0.25 mg total) by mouth 3 (three) times daily. 10/20/16  Yes Rexene Alberts, MD  losartan (COZAAR) 50 MG tablet TAKE ONE TABLET BY MOUTH DAILY. 07/04/16  Yes [name], MD  lubiprostone (AMITIZA) 24 MCG capsule Take 1 capsule (24 mcg total) by mouth 2 (two) times daily with a meal. 10/31/16  Yes Esterwood, Amy S, PA-C  metoprolol tartrate (LOPRESSOR) 50 MG tablet Take 1 tablet (50 mg total) by mouth 2 (two) times daily. Please call and schedule appointment 09/26/16  Yes Hilty, Nadean Corwin, MD  montelukast (SINGULAIR) 10 MG tablet TAKE ONE TABLET BY MOUTH ONCE DAILY. 08/30/16  Yes [name], MD  Multiple Vitamins-Minerals (ONE-A-DAY 50 PLUS PO) Take 1 tablet by mouth daily.   Yes [provider]  ondansetron (ZOFRAN ODT) 8 MG disintegrating tablet Take 1 tablet (8 mg total) by mouth every 8 (eight) hours as needed for nausea or vomiting. 10/10/16  Yes Jola Schmidt, MD  pregabalin (LYRICA) 75 MG capsule Take 1 capsule (75 mg total) by mouth daily. 10/20/16  Yes Rexene Alberts, MD  RABEprazole (ACIPHEX) 20 MG tablet Take 1 tablet (20 mg total) by mouth 2 (two) times daily. 06/12/16  Yes Pyrtle, Lajuan Lines, MD  RESTASIS 0.05 % ophthalmic emulsion Place 1 drop into both eyes 2 (two) times daily.  02/04/14  Yes [provider]  risperiDONE (RISPERDAL) 0.5 MG tablet Take 1 tablet (0.5 mg total) by mouth at bedtime. 09/27/16  Yes Cloria Spring, MD  rosuvastatin (CRESTOR) 5 MG tablet TAKE 1 TABLET BY MOUTH AT BEDTIME. 08/30/16  Yes [name], MD  sertraline (ZOLOFT) 100 MG tablet Take 1 tablet (100 mg total) by mouth daily. 10/20/16  Yes Rexene Alberts, MD  traZODone (DESYREL) 100 MG  tablet Take 1 tablet (100 mg total) by mouth at bedtime. 10/20/16  Yes Rexene Alberts, MD    Family History Family History  Problem Relation Age of Onset  . Heart attack Mother        HTN  . Pneumonia Father   . Kidney failure Father   . Diabetes Father   . Pancreatic cancer Sister   . Diabetes Brother   . Hypertension Brother   . Diabetes Brother   . Cancer Sister        breast   . Hypertension Son   . Sleep apnea Son   . Cancer Sister        pancreatic  . Stroke Maternal Grandmother   . Heart attack Maternal Grandfather   . Alcohol abuse Maternal Uncle   . Colon cancer Neg Hx   . Anesthesia problems Neg Hx   . Hypotension Neg Hx   . Malignant hyperthermia Neg Hx   . Pseudochol deficiency Neg Hx     Social History Social History  Substance Use Topics  . Smoking status: Light Tobacco Smoker    Packs/day: 0.25    Years: 7.00    Types: Cigarettes  . Smokeless tobacco: Never Used     Comment: continues to smoke 1/4 pack a day   . Alcohol use No     Comment:       Allergies   Cephalexin; Iron; Milk-related compounds; Penicillins; and Phenazopyridine hcl   Review of Systems Review of Systems  Unable to perform ROS: Mental status change  Respiratory: Negative for shortness of breath.   Neurological: Positive for weakness. Negative for focal weakness.     Physical Exam Updated Vital Signs BP (!) 125/59   Pulse 65   Temp (!) 96.3 F (35.7 C)   Resp 19   Ht 5' (1.524 m)   Wt 66.7 kg (147 lb)   SpO2  98%   BMI 28.71 kg/m   Physical Exam  Constitutional: She appears well-developed.  HENT:  Head: Normocephalic.  Eyes: Conjunctivae and EOM are normal. No scleral icterus.  Neck: Neck supple. No thyromegaly present.  Cardiovascular: Normal rate and regular rhythm.  Exam reveals no gallop and no friction rub.   No murmur heard. Pulmonary/Chest: No stridor. She has no wheezes. She has no rales. She exhibits no tenderness.  Abdominal: She exhibits no  distension. There is no tenderness. There is no rebound.  Musculoskeletal: Normal range of motion. She exhibits no edema.  Lymphadenopathy:    She has no cervical adenopathy.  Neurological: She exhibits normal muscle tone. Coordination normal.  Patient lethargic. Patient oriented to person only. She answers questions slowly with slurred speech  Skin: No rash noted. No erythema.  Psychiatric: She has a normal mood and affect. Her behavior is normal.     ED Treatments / Results  Labs (all labs ordered are listed, but only abnormal results are displayed) Labs Reviewed  COMPREHENSIVE METABOLIC PANEL - Abnormal; Notable for the following:       Result Value   Creatinine, Ser 1.35 (*)    AST 47 (*)    GFR calc non Af Amer 40 (*)    GFR calc Af Amer 46 (*)    All other components within normal limits  CBC WITH DIFFERENTIAL/PLATELET - Abnormal; Notable for the following:    Hemoglobin 10.6 (*)    HCT 34.9 (*)    MCV 75.5 (*)    MCH 22.9 (*)    All other components within normal limits  RAPID URINE DRUG SCREEN, HOSP PERFORMED - Abnormal; Notable for the following:    Opiates POSITIVE (*)    Benzodiazepines POSITIVE (*)    All other components within normal limits  I-STAT CG4 LACTIC ACID, ED - Abnormal; Notable for the following:    Lactic Acid, Venous 2.23 (*)    All other components within normal limits  CULTURE, BLOOD (ROUTINE X 2)  CULTURE, BLOOD (ROUTINE X 2)  URINALYSIS, ROUTINE W REFLEX MICROSCOPIC  CBG MONITORING, ED  I-STAT CG4 LACTIC ACID, ED    EKG  EKG Interpretation  Date/Time:  Sunday November 12 2016 12:04:35 EDT Ventricular Rate:  60 PR Interval:    QRS Duration: 94 QT Interval:  431 QTC Calculation: 431 R Axis:   26 Text Interpretation:  Sinus rhythm Abnormal R-wave progression, early transition Confirmed by Milton Ferguson [phone]) on 11/12/2016 2:46:10 PM       Radiology Dg Chest Portable 1 View  Result Date: 11/12/2016 CLINICAL DATA:  CODE SEPSIS. Pt  fell at home this AM. Generalized weakness. Hx stroke, HTN, GERD, COPD, diabetes, smoker EXAM: PORTABLE CHEST 1 VIEW COMPARISON:  10/17/2016 FINDINGS: Upper zone pulmonary vascular prominence on this semi erect view of the chest. Atherosclerotic calcification of the aortic arch. Nasogastric and endotracheal tubes are no longer present. No overt airspace opacity. Mildly low lung volumes. No blunting of the costophrenic angles. IMPRESSION: 1. Upper zone pulmonary vascular prominence could represent pulmonary venous hypertension but is nonspecific in this patient due to semi a recumbent positioning. No overt edema. 2. Borderline low lung volumes but improved from 10/17/2016. 3.  Aortic Atherosclerosis (ICD10-I70.0). Electronically Signed   By: Van Clines M.D.   On: 11/12/2016 12:57    Procedures Procedures (including critical care time)  Medications Ordered in ED Medications  dextrose 5 % with aztreonam (AZACTAM) ADS Med (  Not Given 11/12/16 1255)  sodium  chloride 0.9 % bolus 2,000 mL (0 mLs Intravenous Stopped 11/12/16 1443)  naloxone (NARCAN) injection 2 mg (2 mg Intravenous Given 11/12/16 1220)  vancomycin (VANCOCIN) IVPB 1000 mg/200 mL premix (0 mg Intravenous Stopped 11/12/16 1443)  aztreonam (AZACTAM) 2 g in dextrose 5 % 50 mL IVPB (0 g Intravenous Stopped 11/12/16 1340)  sodium chloride 0.9 % bolus 1,000 mL (0 mLs Intravenous Stopped 11/12/16 1443)     Initial Impression / Assessment and Plan / ED Course  I have reviewed the triage vital signs and the nursing notes.  Pertinent labs & imaging results that were available during my care of the patient were reviewed by me and considered in my medical decision making (see chart for details). CRITICAL CARE Performed by: Desire Fulp L Total critical care time:35 minutes Critical care time was exclusive of separately billable procedures and treating other patients. Critical care was necessary to treat or prevent imminent or life-threatening  deterioration. Critical care was time spent personally by me on the following activities: development of treatment plan with patient and/or surrogate as well as nursing, discussions with consultants, evaluation of patient's response to treatment, examination of patient, obtaining history from patient or surrogate, ordering and performing treatments and interventions, ordering and review of laboratory studies, ordering and review of radiographic studies, pulse oximetry and re-evaluation of patient's condition.  Patient was given Narcan and she became more alert. She was also given a bolus of 3 L of fluids and her blood pressure returned to normal. Her labs were unremarkable except for mild dehydration and she was positive for benzos and narcotics in her urine. Patient was observed for 3 hours and she was fully oriented the last 2 hours. Patient was able to family without problems. She is discharged home to follow-up with her PCP  Final Clinical Impressions(s) / ED Diagnoses   Final diagnoses:  Dehydration  Substance abuse    New Prescriptions New Prescriptions   No medications on file     Milton Ferguson, MD 11/12/16 1457

## 2016-11-14 DIAGNOSIS — F17210 Nicotine dependence, cigarettes, uncomplicated: Secondary | ICD-10-CM | POA: Diagnosis not present

## 2016-11-14 DIAGNOSIS — I509 Heart failure, unspecified: Secondary | ICD-10-CM | POA: Diagnosis not present

## 2016-11-14 DIAGNOSIS — I69354 Hemiplegia and hemiparesis following cerebral infarction affecting left non-dominant side: Secondary | ICD-10-CM | POA: Diagnosis not present

## 2016-11-14 DIAGNOSIS — Z9181 History of falling: Secondary | ICD-10-CM | POA: Diagnosis not present

## 2016-11-14 DIAGNOSIS — I110 Hypertensive heart disease with heart failure: Secondary | ICD-10-CM | POA: Diagnosis not present

## 2016-11-14 DIAGNOSIS — E1142 Type 2 diabetes mellitus with diabetic polyneuropathy: Secondary | ICD-10-CM | POA: Diagnosis not present

## 2016-11-14 DIAGNOSIS — G40909 Epilepsy, unspecified, not intractable, without status epilepticus: Secondary | ICD-10-CM | POA: Diagnosis not present

## 2016-11-14 DIAGNOSIS — F250 Schizoaffective disorder, bipolar type: Secondary | ICD-10-CM | POA: Diagnosis not present

## 2016-11-14 DIAGNOSIS — J449 Chronic obstructive pulmonary disease, unspecified: Secondary | ICD-10-CM | POA: Diagnosis not present

## 2016-11-16 DIAGNOSIS — I110 Hypertensive heart disease with heart failure: Secondary | ICD-10-CM | POA: Diagnosis not present

## 2016-11-16 DIAGNOSIS — F17210 Nicotine dependence, cigarettes, uncomplicated: Secondary | ICD-10-CM | POA: Diagnosis not present

## 2016-11-16 DIAGNOSIS — G40909 Epilepsy, unspecified, not intractable, without status epilepticus: Secondary | ICD-10-CM | POA: Diagnosis not present

## 2016-11-16 DIAGNOSIS — F250 Schizoaffective disorder, bipolar type: Secondary | ICD-10-CM | POA: Diagnosis not present

## 2016-11-16 DIAGNOSIS — I69354 Hemiplegia and hemiparesis following cerebral infarction affecting left non-dominant side: Secondary | ICD-10-CM | POA: Diagnosis not present

## 2016-11-16 DIAGNOSIS — Z9181 History of falling: Secondary | ICD-10-CM | POA: Diagnosis not present

## 2016-11-16 DIAGNOSIS — E1142 Type 2 diabetes mellitus with diabetic polyneuropathy: Secondary | ICD-10-CM | POA: Diagnosis not present

## 2016-11-16 DIAGNOSIS — J449 Chronic obstructive pulmonary disease, unspecified: Secondary | ICD-10-CM | POA: Diagnosis not present

## 2016-11-16 DIAGNOSIS — I509 Heart failure, unspecified: Secondary | ICD-10-CM | POA: Diagnosis not present

## 2016-11-17 DIAGNOSIS — I509 Heart failure, unspecified: Secondary | ICD-10-CM | POA: Diagnosis not present

## 2016-11-17 DIAGNOSIS — G40909 Epilepsy, unspecified, not intractable, without status epilepticus: Secondary | ICD-10-CM | POA: Diagnosis not present

## 2016-11-17 DIAGNOSIS — I69354 Hemiplegia and hemiparesis following cerebral infarction affecting left non-dominant side: Secondary | ICD-10-CM | POA: Diagnosis not present

## 2016-11-17 DIAGNOSIS — E1142 Type 2 diabetes mellitus with diabetic polyneuropathy: Secondary | ICD-10-CM | POA: Diagnosis not present

## 2016-11-17 DIAGNOSIS — F250 Schizoaffective disorder, bipolar type: Secondary | ICD-10-CM | POA: Diagnosis not present

## 2016-11-17 DIAGNOSIS — J449 Chronic obstructive pulmonary disease, unspecified: Secondary | ICD-10-CM | POA: Diagnosis not present

## 2016-11-17 DIAGNOSIS — Z9181 History of falling: Secondary | ICD-10-CM | POA: Diagnosis not present

## 2016-11-17 DIAGNOSIS — I110 Hypertensive heart disease with heart failure: Secondary | ICD-10-CM | POA: Diagnosis not present

## 2016-11-17 DIAGNOSIS — F17210 Nicotine dependence, cigarettes, uncomplicated: Secondary | ICD-10-CM | POA: Diagnosis not present

## 2016-11-17 LAB — CULTURE, BLOOD (ROUTINE X 2)
Culture: NO GROWTH
Special Requests: ADEQUATE

## 2016-11-20 ENCOUNTER — Ambulatory Visit: Payer: Self-pay | Admitting: Family Medicine

## 2016-11-20 DIAGNOSIS — I509 Heart failure, unspecified: Secondary | ICD-10-CM | POA: Diagnosis not present

## 2016-11-20 DIAGNOSIS — I69354 Hemiplegia and hemiparesis following cerebral infarction affecting left non-dominant side: Secondary | ICD-10-CM | POA: Diagnosis not present

## 2016-11-20 DIAGNOSIS — F17210 Nicotine dependence, cigarettes, uncomplicated: Secondary | ICD-10-CM | POA: Diagnosis not present

## 2016-11-20 DIAGNOSIS — Z9181 History of falling: Secondary | ICD-10-CM | POA: Diagnosis not present

## 2016-11-20 DIAGNOSIS — F250 Schizoaffective disorder, bipolar type: Secondary | ICD-10-CM | POA: Diagnosis not present

## 2016-11-20 DIAGNOSIS — J449 Chronic obstructive pulmonary disease, unspecified: Secondary | ICD-10-CM | POA: Diagnosis not present

## 2016-11-20 DIAGNOSIS — G40909 Epilepsy, unspecified, not intractable, without status epilepticus: Secondary | ICD-10-CM | POA: Diagnosis not present

## 2016-11-20 DIAGNOSIS — I110 Hypertensive heart disease with heart failure: Secondary | ICD-10-CM | POA: Diagnosis not present

## 2016-11-20 DIAGNOSIS — E1142 Type 2 diabetes mellitus with diabetic polyneuropathy: Secondary | ICD-10-CM | POA: Diagnosis not present

## 2016-11-21 ENCOUNTER — Encounter: Payer: Self-pay | Admitting: Family Medicine

## 2016-11-21 ENCOUNTER — Ambulatory Visit (INDEPENDENT_AMBULATORY_CARE_PROVIDER_SITE_OTHER): Payer: Medicare HMO | Admitting: Family Medicine

## 2016-11-21 VITALS — BP 100/60 | HR 72 | Temp 98.6°F | Resp 16 | Ht 60.0 in | Wt 149.2 lb

## 2016-11-21 DIAGNOSIS — E669 Obesity, unspecified: Secondary | ICD-10-CM

## 2016-11-21 DIAGNOSIS — F17200 Nicotine dependence, unspecified, uncomplicated: Secondary | ICD-10-CM

## 2016-11-21 DIAGNOSIS — F3170 Bipolar disorder, currently in remission, most recent episode unspecified: Secondary | ICD-10-CM | POA: Diagnosis not present

## 2016-11-21 DIAGNOSIS — G40909 Epilepsy, unspecified, not intractable, without status epilepticus: Secondary | ICD-10-CM | POA: Diagnosis not present

## 2016-11-21 DIAGNOSIS — E1169 Type 2 diabetes mellitus with other specified complication: Secondary | ICD-10-CM

## 2016-11-21 DIAGNOSIS — Z23 Encounter for immunization: Secondary | ICD-10-CM | POA: Diagnosis not present

## 2016-11-21 DIAGNOSIS — R52 Pain, unspecified: Secondary | ICD-10-CM

## 2016-11-21 DIAGNOSIS — I952 Hypotension due to drugs: Secondary | ICD-10-CM | POA: Diagnosis not present

## 2016-11-21 MED ORDER — ACETAMINOPHEN 325 MG PO TABS: 325.0000 mg | ORAL_TABLET | Freq: Once | ORAL | Status: DC

## 2016-11-21 NOTE — Patient Instructions (Addendum)
f/u in November as before, call if you need me befoore  Blood pressure is tool low, STOP amlodipine we will call your nurse and your pharmacy  Flu vaccine today  Need to stop smoking   Steps to Quit Smoking Smoking tobacco can be bad for your health. It can also affect almost every organ in your body. Smoking puts you and people around you at risk for many serious long-lasting (chronic) diseases. Quitting smoking is hard, but it is one of the best things that you can do for your health. It is never too late to quit. What are the benefits of quitting smoking? When you quit smoking, you lower your risk for getting serious diseases and conditions. They can include:  Lung cancer or lung disease.  Heart disease.  Stroke.  Heart attack.  Not being able to have children (infertility).  Weak bones (osteoporosis) and broken bones (fractures).  If you have coughing, wheezing, and shortness of breath, those symptoms may get better when you quit. You may also get sick less often. If you are pregnant, quitting smoking can help to lower your chances of having a baby of low birth weight. What can I do to help me quit smoking? Talk with your doctor about what can help you quit smoking. Some things you can do (strategies) include:  Quitting smoking totally, instead of slowly cutting back how much you smoke over a period of time.  Going to in-person counseling. You are more likely to quit if you go to many counseling sessions.  Using resources and support systems, such as: ? Database administrator with a Social worker. ? Phone quitlines. ? Careers information officer. ? Support groups or group counseling. ? Text messaging programs. ? Mobile phone apps or applications.  Taking medicines. Some of these medicines may have nicotine in them. If you are pregnant or breastfeeding, do not take any medicines to quit smoking unless your doctor says it is okay. Talk with your doctor about counseling or other things  that can help you.  Talk with your doctor about using more than one strategy at the same time, such as taking medicines while you are also going to in-person counseling. This can help make quitting easier. What things can I do to make it easier to quit? Quitting smoking might feel very hard at first, but there is a lot that you can do to make it easier. Take these steps:  Talk to your family and friends. Ask them to support and encourage you.  Call phone quitlines, reach out to support groups, or work with a Social worker.  Ask people who smoke to not smoke around you.  Avoid places that make you want (trigger) to smoke, such as: ? Bars. ? Parties. ? Smoke-break areas at work.  Spend time with people who do not smoke.  Lower the stress in your life. Stress can make you want to smoke. Try these things to help your stress: ? Getting regular exercise. ? Deep-breathing exercises. ? Yoga. ? Meditating. ? Doing a body scan. To do this, close your eyes, focus on one area of your body at a time from head to toe, and notice which parts of your body are tense. Try to relax the muscles in those areas.  Download or buy apps on your mobile phone or tablet that can help you stick to your quit plan. There are many free apps, such as QuitGuide from the [employer] Office manager for Disease Control and Prevention). You can find more support from  smokefree.gov and other websites.  This information is not intended to replace advice given to you by your health care provider. Make sure you discuss any questions you have with your health care provider. Document Released: 12/24/2008 Document Revised: 10/26/2015 Document Reviewed: 07/14/2014 Elsevier Interactive Patient Education  2018 [employer].

## 2016-11-22 DIAGNOSIS — E1142 Type 2 diabetes mellitus with diabetic polyneuropathy: Secondary | ICD-10-CM | POA: Diagnosis not present

## 2016-11-22 DIAGNOSIS — I69354 Hemiplegia and hemiparesis following cerebral infarction affecting left non-dominant side: Secondary | ICD-10-CM | POA: Diagnosis not present

## 2016-11-22 DIAGNOSIS — G40909 Epilepsy, unspecified, not intractable, without status epilepticus: Secondary | ICD-10-CM | POA: Diagnosis not present

## 2016-11-22 DIAGNOSIS — I509 Heart failure, unspecified: Secondary | ICD-10-CM | POA: Diagnosis not present

## 2016-11-22 DIAGNOSIS — I110 Hypertensive heart disease with heart failure: Secondary | ICD-10-CM | POA: Diagnosis not present

## 2016-11-22 DIAGNOSIS — F17210 Nicotine dependence, cigarettes, uncomplicated: Secondary | ICD-10-CM | POA: Diagnosis not present

## 2016-11-22 DIAGNOSIS — Z9181 History of falling: Secondary | ICD-10-CM | POA: Diagnosis not present

## 2016-11-22 DIAGNOSIS — J449 Chronic obstructive pulmonary disease, unspecified: Secondary | ICD-10-CM | POA: Diagnosis not present

## 2016-11-22 DIAGNOSIS — F250 Schizoaffective disorder, bipolar type: Secondary | ICD-10-CM | POA: Diagnosis not present

## 2016-11-23 DIAGNOSIS — G40909 Epilepsy, unspecified, not intractable, without status epilepticus: Secondary | ICD-10-CM | POA: Diagnosis not present

## 2016-11-23 DIAGNOSIS — F250 Schizoaffective disorder, bipolar type: Secondary | ICD-10-CM | POA: Diagnosis not present

## 2016-11-23 DIAGNOSIS — J449 Chronic obstructive pulmonary disease, unspecified: Secondary | ICD-10-CM | POA: Diagnosis not present

## 2016-11-23 DIAGNOSIS — Z9181 History of falling: Secondary | ICD-10-CM | POA: Diagnosis not present

## 2016-11-23 DIAGNOSIS — I69354 Hemiplegia and hemiparesis following cerebral infarction affecting left non-dominant side: Secondary | ICD-10-CM | POA: Diagnosis not present

## 2016-11-23 DIAGNOSIS — I110 Hypertensive heart disease with heart failure: Secondary | ICD-10-CM | POA: Diagnosis not present

## 2016-11-23 DIAGNOSIS — I509 Heart failure, unspecified: Secondary | ICD-10-CM | POA: Diagnosis not present

## 2016-11-23 DIAGNOSIS — E1142 Type 2 diabetes mellitus with diabetic polyneuropathy: Secondary | ICD-10-CM | POA: Diagnosis not present

## 2016-11-23 DIAGNOSIS — F17210 Nicotine dependence, cigarettes, uncomplicated: Secondary | ICD-10-CM | POA: Diagnosis not present

## 2016-11-23 IMAGING — CT CT CERVICAL SPINE W/O CM
3 of 7 series · 11 of 33 positions shown, 13 images · non-contrast
Comparison: Brain MRI dated 02/22/2015 and CT dated 02/21/2015

CLINICAL DATA: 64-year-old female with fall

EXAM:
CT HEAD WITHOUT CONTRAST
CT CERVICAL SPINE WITHOUT CONTRAST
TECHNIQUE: Multidetector CT imaging of the head and cervical spine was
performed following the standard protocol without intravenous
contrast. Multiplanar CT image reconstructions of the cervical spine
were also generated.

[Series 9: sagittal bone 2.0 · sagittal · 0.22mm/px · 5 of 59 slices shown, 6 images]
[im 20/59  bone]
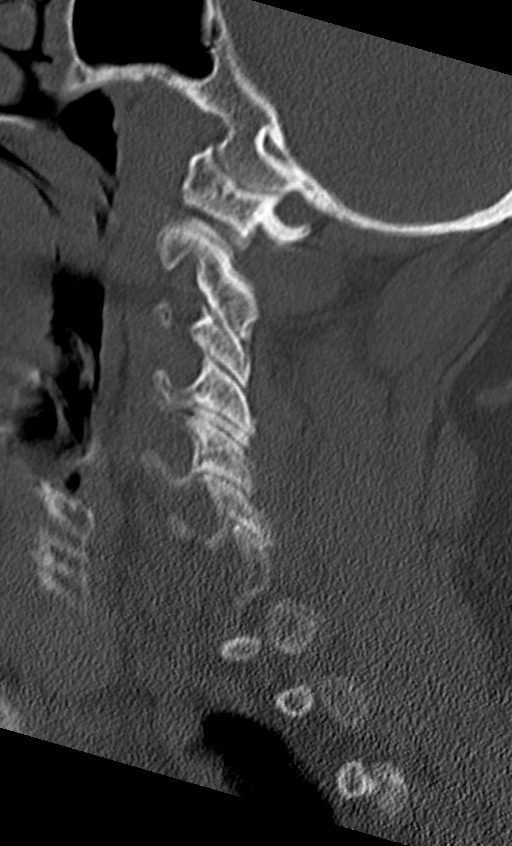
[im 25/59  bone]
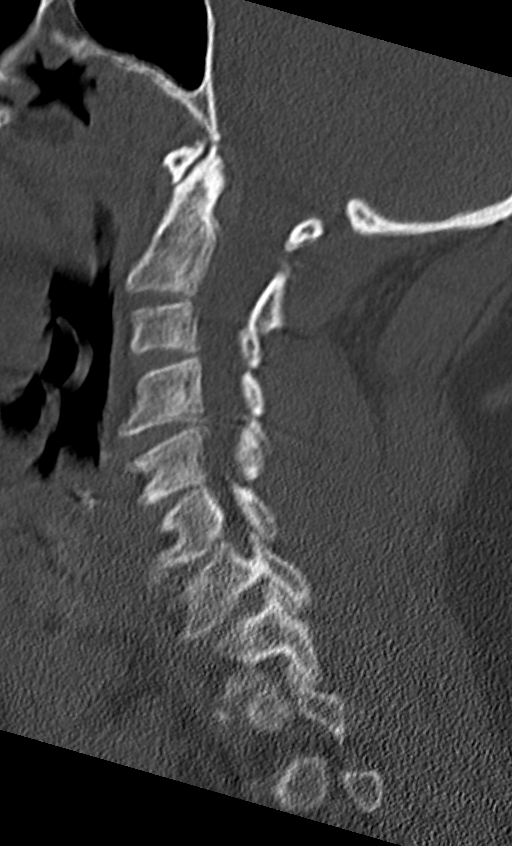
[im 30/59  soft-tissue]
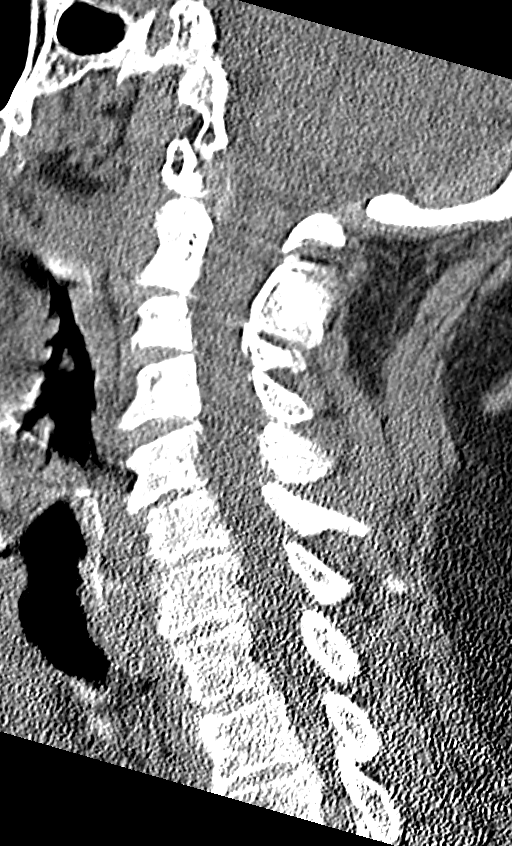
[im 30/59  bone]
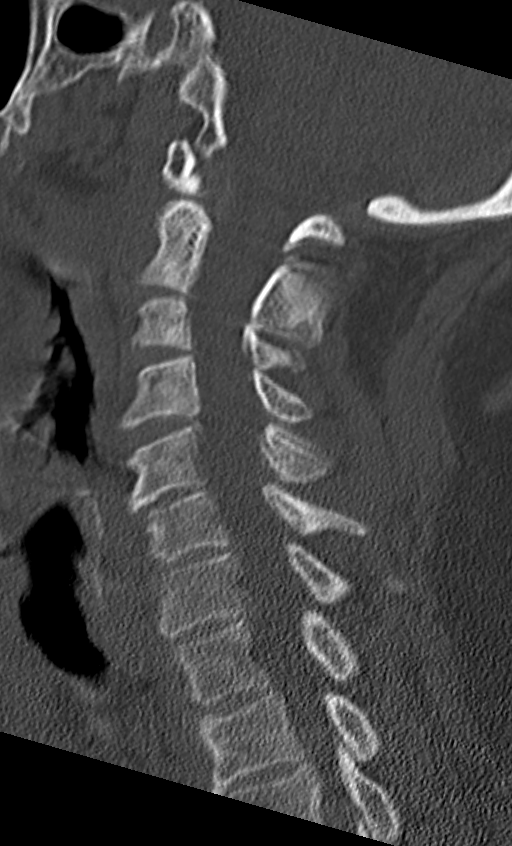
[im 34/59  bone]
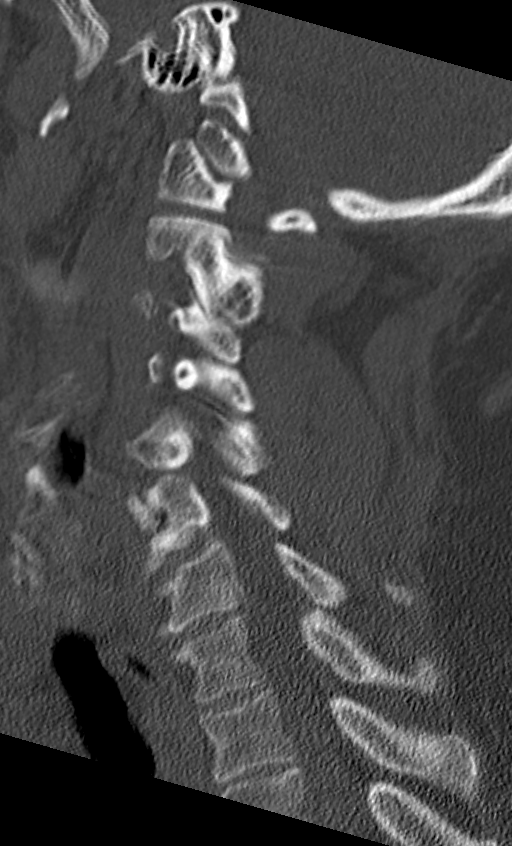
[im 39/59  bone]
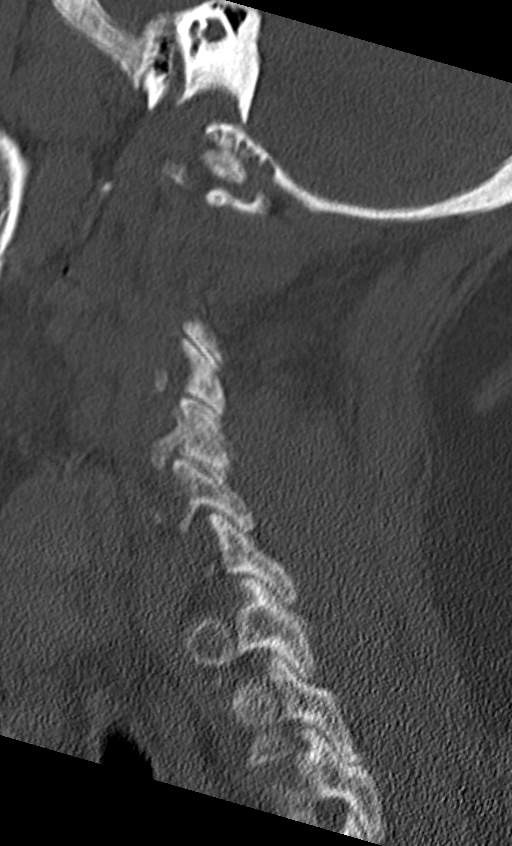

[Series 10: coronal bone 2.0 · coronal · 0.20mm/px · 3 of 53 slices shown]
[im 11/53  bone]
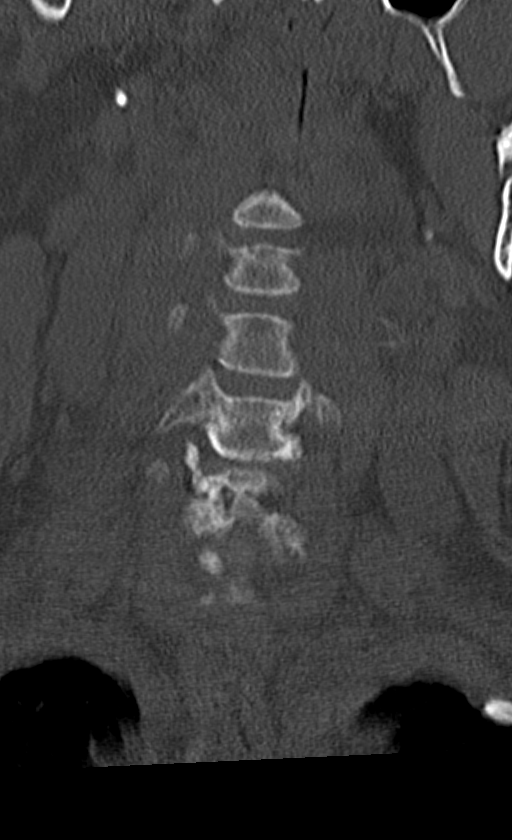
[im 21/53  bone]
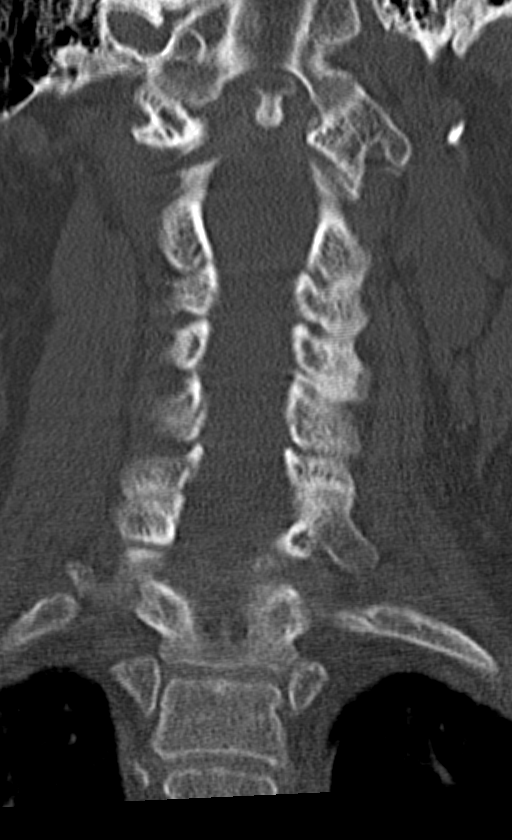
[im 32/53  bone]
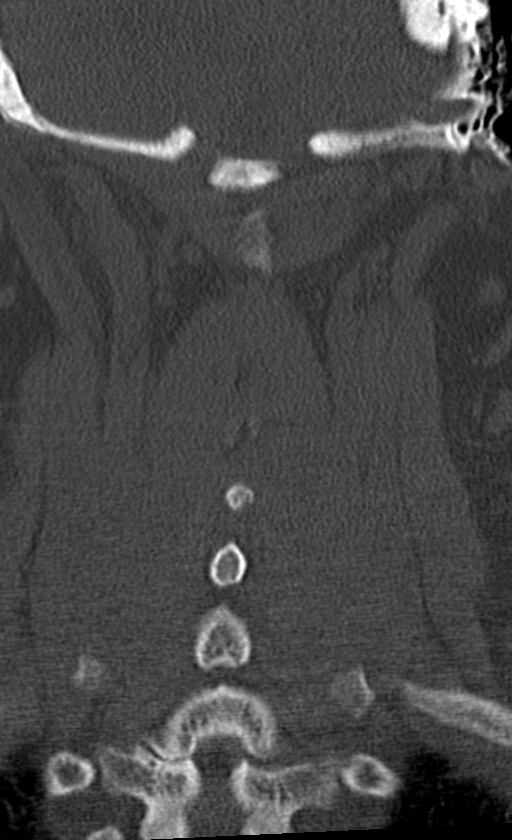

[Series 11: axial bone 2.0 · axial · 0.21mm/px · z∈[-59,+23]mm · 3 of 90 slices shown, 4 images]
[im 23/90  soft-tissue]
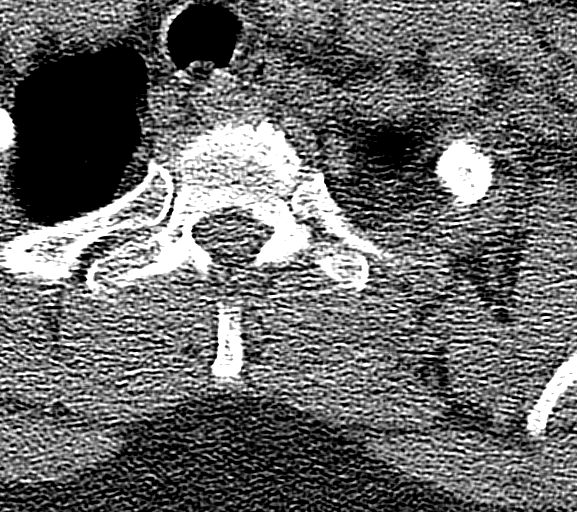
[im 23/90  bone]
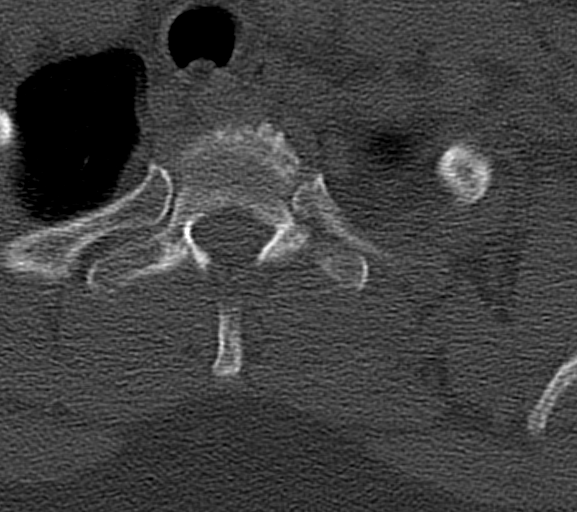
[im 45/90  bone]
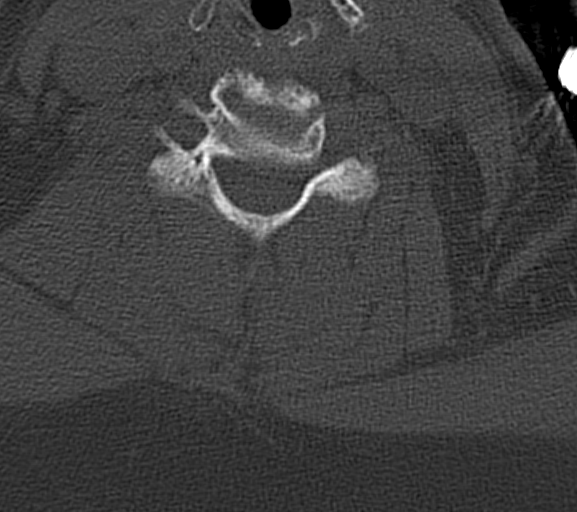
[im 67/90  bone]
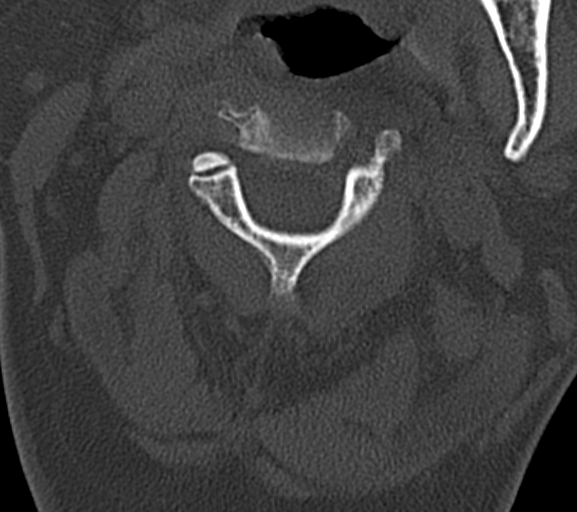

[11 of 33 positions shown; findings below may reference images not displayed]

FINDINGS: Evaluation of this exam is limited due to motion artifact.

CT HEAD FINDINGS

There is no acute intracranial hemorrhage. There is stable
appearance of the ventricles and sulci. An area of postsurgical
changes and encephalomalacia noted in the inferior aspect of the
frontal lobes similar to prior study. There is no mass effect or
midline shift.

The visualized paranasal sinuses and mastoid air cells are clear.
Postsurgical changes of frontal craniotomy.

CT CERVICAL SPINE FINDINGS

There is no acute fracture or subluxation of the cervical
spine.There is mild multilevel degenerative changes.The odontoid and
spinous processes are intact.There is normal anatomic alignment of
the C1-C2 lateral masses. The visualized soft tissues appear
unremarkable.

Ill-defined left thyroid nodule. Ultrasound may provide better
evaluation of the thyroid gland.
IMPRESSION: No acute intracranial hemorrhage. Stable frontal craniotomy and
postsurgical changes of the inferior frontal lobes.

No acute/traumatic cervical spine pathology.

## 2016-11-23 IMAGING — DX DG CHEST 1V
1 series · 1 of 1 positions shown · non-contrast
Comparison: 02/21/2015

CLINICAL DATA: Altered mental status. Vertigo. Fell this evening.
Left-sided headache.

EXAM:
CHEST 1 VIEW

[chest ap]
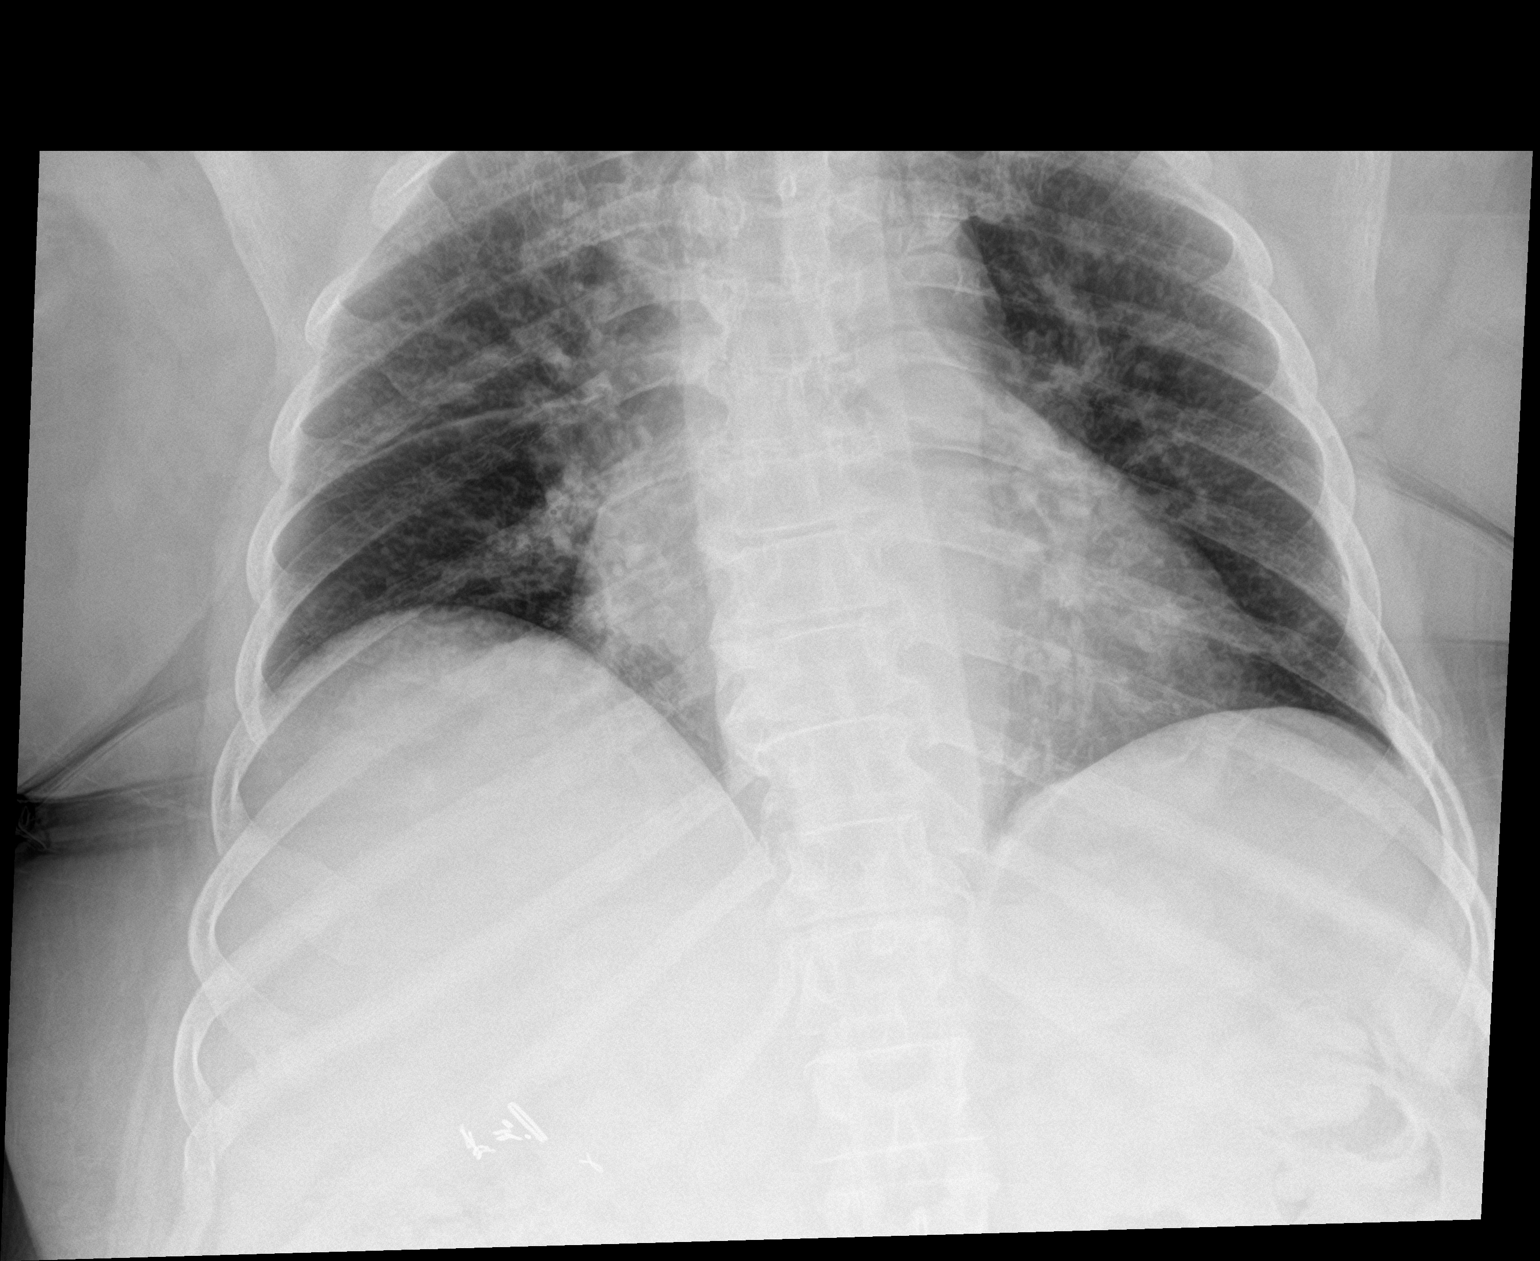

[1 of 1 positions shown; findings below may reference images not displayed]

FINDINGS: Shallow inspiration. Mild cardiac enlargement. No significant
vascular congestion or edema. No focal consolidation or airspace
disease. No blunting of costophrenic angles. No pneumothorax.
Surgical clips in the right upper quadrant.
IMPRESSION: Shallow inspiration. Cardiac enlargement. No evidence of active
pulmonary disease peer

## 2016-11-26 ENCOUNTER — Encounter: Payer: Self-pay | Admitting: Family Medicine

## 2016-11-26 NOTE — Assessment & Plan Note (Signed)
Recently having challenges with mental health with decompensations in past 6 to 8 montes, though not hospitalized has had several acute episodes of decompensation, not suicidal or homicidal bu expresses anger issues and behavioral/ relationship challenges

## 2016-11-26 NOTE — Progress Notes (Signed)
[name]     MRN: [id number]      DOB: 11-21-1950   HPI [name] is here for follow up and re-evaluation of chronic medical conditions, medication management and review of any available recent lab and radiology data.  Preventive health is updated, specifically  Cancer screening and Immunization.   Pt was in the ED las t week with a primary dx of dehydration, she is in quite upset stating that people are telling stories on her that she is abusing her meds which she is not and she is concerned that the Doc who treats her pain  Will not want to continue her pain management as a result  The PT denies any adverse reactions to current medications since the last visit.  There are no new concerns.  There are no specific complaints   ROS Denies recent fever or chills. Denies sinus pressure, nasal congestion, ear pain or sore throat. Denies chest congestion, productive cough or wheezing. Denies chest pains, palpitations and leg swelling Denies abdominal pain, nausea, vomiting,diarrhea or constipation.   Denies dysuria, frequency, hesitancy or incontinence. Denies skin break down or rash.   PE  BP 100/60 (BP Location: Left Arm, Patient Position: Sitting, Cuff Size: Normal)   Pulse 72   Temp 98.6 F (37 C) (Other (Comment))   Resp 16   Ht 5' (1.524 m)   Wt 149 lb 4 oz (67.7 kg)   SpO2 99%   BMI 29.15 kg/m   Patient alert and oriented and in no cardiopulmonary distress.  HEENT: No facial asymmetry, EOMI,   oropharynx pink and moist.  Neck supple no JVD, no mass.  Chest: Clear to auscultation bilaterally.  CVS: S1, S2 no murmurs, no S3.Regular rate.  ABD: Soft non tender.   Ext: No edema  MS: decreased  ROM spine , hips , shoulders and knees Skin: Intact, no ulcerations or rash noted.  Psych: Good eye contact, normal affect. Memory intact not anxious or depressed appearing.  CNS: CN 2-12 intact, power,  normal throughout.no focal deficits noted.   Assessment &  Plan  Hypotension Blood pressure is overcorrected, and patient's nurse and her pharmacy are to be notified  Smoker Patient is asked and  confirms current  Nicotine use.  Five to seven minutes of time is spent in counseling the patient of the need to quit smoking  Advice to quit is delivered clearly specifically in reducing the risk of developing heart disease, having a stroke, or of developing all types of cancer, especially lung and oral cancer. Improvement in breathing and exercise tolerance and quality of life is also discussed, as is the economic benefit.  Assessment of willingness to quit or to make an attempt to quit is made and documented  Assistance in quit attempt is made with several and varied options presented, based on patient's desire and need. These include  literature, local classes available, 1800 QUIT NOW number, OTC and prescription medication.  The GOAL to be NICOTINE FREE is re emphasized.  The patient has set a personal goal of either reduction or discontinuation and follow up is arranged between 6 an 16 weeks.    Seizure disorder (HCC) Controlled, no change in medication   Bipolar disorder ([place]) Recently having challenges with mental health with decompensations in past 6 to 8 montes, though not hospitalized has had several acute episodes of decompensation, not suicidal or homicidal bu expresses anger issues and behavioral/ relationship challenges  Diabetes mellitus type 2 in obese ([place])  Managed by endo Controlled, no change in medication  Ms. [name] is reminded of the importance of commitment to daily physical activity for 30 minutes or more, as able and the need to limit carbohydrate intake to 30 to 60 grams per meal to help with blood sugar control.   The need to take medication as prescribed, test blood sugar as directed, and to call between visits if there is a concern that blood sugar is uncontrolled is also discussed.   Ms. [name] is reminded of the  importance of daily foot exam, annual eye examination, and good blood sugar, blood pressure and cholesterol control.  Diabetic Labs Latest Ref Rng & Units 11/12/2016 10/20/2016 10/18/2016 10/17/2016 10/16/2016  HbA1c 4.8 - 5.6 % - - - - -  Microalbumin <2.0 mg/dL - - - - -  Micro/Creat Ratio 0.0 - 30.0 mg/g - - - - -  Chol 100 - 199 mg/dL - - - - -  HDL >39 mg/dL - - - - -  Calc LDL 0 - 99 mg/dL - - - - -  Triglycerides 0 - 149 mg/dL - - - - -  Creatinine 0.44 - 1.00 mg/dL 1.35(H) 0.73 0.63 0.65 0.93  GFR >60.00 mL/min - - - - -   BP/Weight 11/21/2016 11/12/2016 11/06/2016 11/02/2016 10/31/2016 10/23/2016 11/26/567  Systolic BP 794 801 655 374 827 078 675  Diastolic BP 60 60 52 65 58 72 46  Wt. (Lbs) 149.25 147 147 146 147 149.5 151.4  BMI 29.15 28.71 28.71 29.49 29.69 30.2 29.57  Some encounter information is confidential and restricted. Go to Review Flowsheets activity to see all data.   Foot/eye exam completion dates Latest Ref Rng & Units 09/25/2016 10/08/2015  Eye Exam No Retinopathy - No Retinopathy  Foot exam Order - - -  Foot Form Completion - Done -

## 2016-11-26 NOTE — Assessment & Plan Note (Signed)
Patient is asked and  confirms current  Nicotine use.  Five to seven minutes of time is spent in counseling the patient of the need to quit smoking  Advice to quit is delivered clearly specifically in reducing the risk of developing heart disease, having a stroke, or of developing all types of cancer, especially lung and oral cancer. Improvement in breathing and exercise tolerance and quality of life is also discussed, as is the economic benefit.  Assessment of willingness to quit or to make an attempt to quit is made and documented  Assistance in quit attempt is made with several and varied options presented, based on patient's desire and need. These include  literature, local classes available, 1800 QUIT NOW number, OTC and prescription medication.  The GOAL to be NICOTINE FREE is re emphasized.  The patient has set a personal goal of either reduction or discontinuation and follow up is arranged between 6 an 16 weeks.   

## 2016-11-26 NOTE — Assessment & Plan Note (Addendum)
Managed by endo Controlled, no change in medication  [name] is reminded of the importance of commitment to daily physical activity for 30 minutes or more, as able and the need to limit carbohydrate intake to 30 to 60 grams per meal to help with blood sugar control.   The need to take medication as prescribed, test blood sugar as directed, and to call between visits if there is a concern that blood sugar is uncontrolled is also discussed.   [name] is reminded of the importance of daily foot exam, annual eye examination, and good blood sugar, blood pressure and cholesterol control.  Diabetic Labs Latest Ref Rng & Units 11/12/2016 10/20/2016 10/18/2016 10/17/2016 10/16/2016  HbA1c 4.8 - 5.6 % - - - - -  Microalbumin <2.0 mg/dL - - - - -  Micro/Creat Ratio 0.0 - 30.0 mg/g - - - - -  Chol 100 - 199 mg/dL - - - - -  HDL >39 mg/dL - - - - -  Calc LDL 0 - 99 mg/dL - - - - -  Triglycerides 0 - 149 mg/dL - - - - -  Creatinine 0.44 - 1.00 mg/dL 1.35(H) 0.73 0.63 0.65 0.93  GFR >60.00 mL/min - - - - -   BP/Weight 11/21/2016 11/12/2016 11/06/2016 11/02/2016 10/31/2016 10/23/2016 7/90/2409  Systolic BP 735 329 924 268 341 962 229  Diastolic BP 60 60 52 65 58 72 46  Wt. (Lbs) 149.25 147 147 146 147 149.5 151.4  BMI 29.15 28.71 28.71 29.49 29.69 30.2 29.57  Some encounter information is confidential and restricted. Go to Review Flowsheets activity to see all data.   Foot/eye exam completion dates Latest Ref Rng & Units 09/25/2016 10/08/2015  Eye Exam No Retinopathy - No Retinopathy  Foot exam Order - - -  Foot Form Completion - Done -

## 2016-11-26 NOTE — Assessment & Plan Note (Signed)
Blood pressure is overcorrected, and patient's nurse and her pharmacy are to be notified

## 2016-11-26 NOTE — Assessment & Plan Note (Signed)
Controlled, no change in medication  

## 2016-11-28 ENCOUNTER — Ambulatory Visit: Payer: Medicare HMO | Admitting: Internal Medicine

## 2016-11-28 DIAGNOSIS — J449 Chronic obstructive pulmonary disease, unspecified: Secondary | ICD-10-CM | POA: Diagnosis not present

## 2016-11-28 DIAGNOSIS — G40909 Epilepsy, unspecified, not intractable, without status epilepticus: Secondary | ICD-10-CM | POA: Diagnosis not present

## 2016-11-28 DIAGNOSIS — F250 Schizoaffective disorder, bipolar type: Secondary | ICD-10-CM | POA: Diagnosis not present

## 2016-11-28 DIAGNOSIS — E1142 Type 2 diabetes mellitus with diabetic polyneuropathy: Secondary | ICD-10-CM | POA: Diagnosis not present

## 2016-11-28 DIAGNOSIS — I509 Heart failure, unspecified: Secondary | ICD-10-CM | POA: Diagnosis not present

## 2016-11-28 DIAGNOSIS — F17210 Nicotine dependence, cigarettes, uncomplicated: Secondary | ICD-10-CM | POA: Diagnosis not present

## 2016-11-28 DIAGNOSIS — Z9181 History of falling: Secondary | ICD-10-CM | POA: Diagnosis not present

## 2016-11-28 DIAGNOSIS — I110 Hypertensive heart disease with heart failure: Secondary | ICD-10-CM | POA: Diagnosis not present

## 2016-11-28 DIAGNOSIS — I69354 Hemiplegia and hemiparesis following cerebral infarction affecting left non-dominant side: Secondary | ICD-10-CM | POA: Diagnosis not present

## 2016-11-29 ENCOUNTER — Telehealth: Payer: Self-pay

## 2016-11-29 DIAGNOSIS — G894 Chronic pain syndrome: Secondary | ICD-10-CM | POA: Diagnosis not present

## 2016-11-29 DIAGNOSIS — M5030 Other cervical disc degeneration, unspecified cervical region: Secondary | ICD-10-CM | POA: Diagnosis not present

## 2016-11-29 DIAGNOSIS — M961 Postlaminectomy syndrome, not elsewhere classified: Secondary | ICD-10-CM | POA: Diagnosis not present

## 2016-11-29 DIAGNOSIS — Z79891 Long term (current) use of opiate analgesic: Secondary | ICD-10-CM | POA: Diagnosis not present

## 2016-11-29 NOTE — Telephone Encounter (Signed)
Called pt to let her know we had a cancellation this afternoon if she wanted to come in earlier for her AWV.    [name], B.A.  Care Guide [phone]

## 2016-11-30 ENCOUNTER — Telehealth: Payer: Self-pay

## 2016-11-30 ENCOUNTER — Ambulatory Visit (HOSPITAL_COMMUNITY): Payer: Medicare HMO | Admitting: Psychiatry

## 2016-11-30 DIAGNOSIS — Z9181 History of falling: Secondary | ICD-10-CM | POA: Diagnosis not present

## 2016-11-30 DIAGNOSIS — I69354 Hemiplegia and hemiparesis following cerebral infarction affecting left non-dominant side: Secondary | ICD-10-CM | POA: Diagnosis not present

## 2016-11-30 DIAGNOSIS — J449 Chronic obstructive pulmonary disease, unspecified: Secondary | ICD-10-CM | POA: Diagnosis not present

## 2016-11-30 DIAGNOSIS — F17210 Nicotine dependence, cigarettes, uncomplicated: Secondary | ICD-10-CM | POA: Diagnosis not present

## 2016-11-30 DIAGNOSIS — I509 Heart failure, unspecified: Secondary | ICD-10-CM | POA: Diagnosis not present

## 2016-11-30 DIAGNOSIS — F250 Schizoaffective disorder, bipolar type: Secondary | ICD-10-CM | POA: Diagnosis not present

## 2016-11-30 DIAGNOSIS — I110 Hypertensive heart disease with heart failure: Secondary | ICD-10-CM | POA: Diagnosis not present

## 2016-11-30 DIAGNOSIS — G40909 Epilepsy, unspecified, not intractable, without status epilepticus: Secondary | ICD-10-CM | POA: Diagnosis not present

## 2016-11-30 DIAGNOSIS — E1142 Type 2 diabetes mellitus with diabetic polyneuropathy: Secondary | ICD-10-CM | POA: Diagnosis not present

## 2016-11-30 LAB — CULTURE, BLOOD (ROUTINE X 2)
Culture: NO GROWTH
Special Requests: ADEQUATE

## 2016-11-30 NOTE — Telephone Encounter (Signed)
Left message to tell pt that [facility] is aware of her change in appointment time. They will pick her up at 10:45 for her appointment at 11:45 for her AWV with Nurse Health Advisor at PCP office.   If she has questions, my cb# is [place], [place].  Care Guide

## 2016-12-01 ENCOUNTER — Other Ambulatory Visit: Payer: Self-pay | Admitting: Internal Medicine

## 2016-12-01 ENCOUNTER — Other Ambulatory Visit: Payer: Self-pay | Admitting: Family Medicine

## 2016-12-01 DIAGNOSIS — R32 Unspecified urinary incontinence: Secondary | ICD-10-CM | POA: Diagnosis not present

## 2016-12-01 DIAGNOSIS — I639 Cerebral infarction, unspecified: Secondary | ICD-10-CM | POA: Diagnosis not present

## 2016-12-01 DIAGNOSIS — M150 Primary generalized (osteo)arthritis: Secondary | ICD-10-CM | POA: Diagnosis not present

## 2016-12-04 ENCOUNTER — Telehealth: Payer: Self-pay | Admitting: Family Medicine

## 2016-12-04 ENCOUNTER — Other Ambulatory Visit: Payer: Self-pay | Admitting: Internal Medicine

## 2016-12-04 DIAGNOSIS — F250 Schizoaffective disorder, bipolar type: Secondary | ICD-10-CM | POA: Diagnosis not present

## 2016-12-04 DIAGNOSIS — F17210 Nicotine dependence, cigarettes, uncomplicated: Secondary | ICD-10-CM | POA: Diagnosis not present

## 2016-12-04 DIAGNOSIS — Z9181 History of falling: Secondary | ICD-10-CM | POA: Diagnosis not present

## 2016-12-04 DIAGNOSIS — I110 Hypertensive heart disease with heart failure: Secondary | ICD-10-CM | POA: Diagnosis not present

## 2016-12-04 DIAGNOSIS — E1142 Type 2 diabetes mellitus with diabetic polyneuropathy: Secondary | ICD-10-CM | POA: Diagnosis not present

## 2016-12-04 DIAGNOSIS — I69354 Hemiplegia and hemiparesis following cerebral infarction affecting left non-dominant side: Secondary | ICD-10-CM | POA: Diagnosis not present

## 2016-12-04 DIAGNOSIS — G40909 Epilepsy, unspecified, not intractable, without status epilepticus: Secondary | ICD-10-CM | POA: Diagnosis not present

## 2016-12-04 DIAGNOSIS — J449 Chronic obstructive pulmonary disease, unspecified: Secondary | ICD-10-CM | POA: Diagnosis not present

## 2016-12-04 DIAGNOSIS — I509 Heart failure, unspecified: Secondary | ICD-10-CM | POA: Diagnosis not present

## 2016-12-04 NOTE — Telephone Encounter (Signed)
Patient left voicemail I had a hard time hearing it, but she said her bed broke and its urgent that she get some assistance. I tried to call her back , no answer [phone]

## 2016-12-05 DIAGNOSIS — Z9181 History of falling: Secondary | ICD-10-CM | POA: Diagnosis not present

## 2016-12-05 DIAGNOSIS — I509 Heart failure, unspecified: Secondary | ICD-10-CM | POA: Diagnosis not present

## 2016-12-05 DIAGNOSIS — E1142 Type 2 diabetes mellitus with diabetic polyneuropathy: Secondary | ICD-10-CM | POA: Diagnosis not present

## 2016-12-05 DIAGNOSIS — G40909 Epilepsy, unspecified, not intractable, without status epilepticus: Secondary | ICD-10-CM | POA: Diagnosis not present

## 2016-12-05 DIAGNOSIS — F250 Schizoaffective disorder, bipolar type: Secondary | ICD-10-CM | POA: Diagnosis not present

## 2016-12-05 DIAGNOSIS — I69354 Hemiplegia and hemiparesis following cerebral infarction affecting left non-dominant side: Secondary | ICD-10-CM | POA: Diagnosis not present

## 2016-12-05 DIAGNOSIS — F17210 Nicotine dependence, cigarettes, uncomplicated: Secondary | ICD-10-CM | POA: Diagnosis not present

## 2016-12-05 DIAGNOSIS — J449 Chronic obstructive pulmonary disease, unspecified: Secondary | ICD-10-CM | POA: Diagnosis not present

## 2016-12-05 DIAGNOSIS — I110 Hypertensive heart disease with heart failure: Secondary | ICD-10-CM | POA: Diagnosis not present

## 2016-12-06 ENCOUNTER — Ambulatory Visit (INDEPENDENT_AMBULATORY_CARE_PROVIDER_SITE_OTHER): Payer: Medicare HMO

## 2016-12-06 ENCOUNTER — Ambulatory Visit: Payer: Self-pay

## 2016-12-06 VITALS — BP 140/72 | HR 80 | Temp 98.1°F | Ht 60.0 in | Wt 149.1 lb

## 2016-12-06 DIAGNOSIS — Z0000 Encounter for general adult medical examination without abnormal findings: Secondary | ICD-10-CM

## 2016-12-06 DIAGNOSIS — M79609 Pain in unspecified limb: Secondary | ICD-10-CM | POA: Diagnosis not present

## 2016-12-06 NOTE — Patient Instructions (Addendum)
Ms. Naeve , Thank you for taking time to come for your Medicare Wellness Visit. I appreciate your ongoing commitment to your health goals. Please review the following plan we discussed and let me know if I can assist you in the future.   Screening recommendations/referrals: Colonoscopy: Up to date, next due 01/2026 Mammogram: Up to date, next due 08/2017 Bone Density: Up to date Diabetic Eye Exam: Please have your eye Dr. Lovena Neighbours over your most recent eye exam Recommended yearly dental visit for hygiene and checkup  Vaccinations: Influenza vaccine: Up to date Pneumococcal vaccine: Up to date Tdap vaccine: Up to date, next due 12/2021 Shingles vaccine: Due, declines    Advanced directives: Advance directive discussed with you today. I have provided a copy for you to complete at home and have notarized. Once this is complete please bring a copy in to our office so we can scan it into your chart.  Conditions/risks identified: Pre-obese, recommend starting a routine exercise program at least 3 days a week for 30-45 minutes at a time as tolerated. Recommend you quit smoking to decrease your risk of developing lung cancer.    Next appointment: Follow up with [name] on 01/23/2017 at 2:20 pm. Follow up in 1 year for your annual wellness visit.  Preventive Care 26 Years and Older, Female Preventive care refers to lifestyle choices and visits with your health care provider that can promote health and wellness. What does preventive care include?  A yearly physical exam. This is also called an annual well check.  Dental exams once or twice a year.  Routine eye exams. Ask your health care provider how often you should have your eyes checked.  Personal lifestyle choices, including:  Daily care of your teeth and gums.  Regular physical activity.  Eating a healthy diet.  Avoiding tobacco and drug use.  Limiting alcohol use.  Practicing safe sex.  Taking low-dose aspirin every  day.  Taking vitamin and mineral supplements as recommended by your health care provider. What happens during an annual well check? The services and screenings done by your health care provider during your annual well check will depend on your age, overall health, lifestyle risk factors, and family history of disease. Counseling  Your health care provider may ask you questions about your:  Alcohol use.  Tobacco use.  Drug use.  Emotional well-being.  Home and relationship well-being.  Sexual activity.  Eating habits.  History of falls.  Memory and ability to understand (cognition).  Work and work Statistician.  Reproductive health. Screening  You may have the following tests or measurements:  Height, weight, and BMI.  Blood pressure.  Lipid and cholesterol levels. These may be checked every 5 years, or more frequently if you are over 89 years old.  Skin check.  Lung cancer screening. You may have this screening every year starting at age 66 if you have a 30-pack-year history of smoking and currently smoke or have quit within the past 15 years.  Fecal occult blood test (FOBT) of the stool. You may have this test every year starting at age 71.  Flexible sigmoidoscopy or colonoscopy. You may have a sigmoidoscopy every 5 years or a colonoscopy every 10 years starting at age 48.  Hepatitis C blood test.  Hepatitis B blood test.  Sexually transmitted disease (STD) testing.  Diabetes screening. This is done by checking your blood sugar (glucose) after you have not eaten for a while (fasting). You may have this done every 1-3  years.  Bone density scan. This is done to screen for osteoporosis. You may have this done starting at age 13.  Mammogram. This may be done every 1-2 years. Talk to your health care provider about how often you should have regular mammograms. Talk with your health care provider about your test results, treatment options, and if necessary, the need  for more tests. Vaccines  Your health care provider may recommend certain vaccines, such as:  Influenza vaccine. This is recommended every year.  Tetanus, diphtheria, and acellular pertussis (Tdap, Td) vaccine. You may need a Td booster every 10 years.  Zoster vaccine. You may need this after age 99.  Pneumococcal 13-valent conjugate (PCV13) vaccine. One dose is recommended after age 24.  Pneumococcal polysaccharide (PPSV23) vaccine. One dose is recommended after age 67. Talk to your health care provider about which screenings and vaccines you need and how often you need them. This information is not intended to replace advice given to you by your health care provider. Make sure you discuss any questions you have with your health care provider. Document Released: 03/26/2015 Document Revised: 11/17/2015 Document Reviewed: 12/29/2014 Elsevier Interactive Patient Education  2017 [employer] Prevention in the Home Falls can cause injuries. They can happen to people of all ages. There are many things you can do to make your home safe and to help prevent falls. What can I do on the outside of my home?  Regularly fix the edges of walkways and driveways and fix any cracks.  Remove anything that might make you trip as you walk through a door, such as a raised step or threshold.  Trim any bushes or trees on the path to your home.  Use bright outdoor lighting.  Clear any walking paths of anything that might make someone trip, such as rocks or tools.  Regularly check to see if handrails are loose or broken. Make sure that both sides of any steps have handrails.  Any raised decks and porches should have guardrails on the edges.  Have any leaves, snow, or ice cleared regularly.  Use sand or salt on walking paths during winter.  Clean up any spills in your garage right away. This includes oil or grease spills. What can I do in the bathroom?  Use night lights.  Install grab bars  by the toilet and in the tub and shower. Do not use towel bars as grab bars.  Use non-skid mats or decals in the tub or shower.  If you need to sit down in the shower, use a plastic, non-slip stool.  Keep the floor dry. Clean up any water that spills on the floor as soon as it happens.  Remove soap buildup in the tub or shower regularly.  Attach bath mats securely with double-sided non-slip rug tape.  Do not have throw rugs and other things on the floor that can make you trip. What can I do in the bedroom?  Use night lights.  Make sure that you have a light by your bed that is easy to reach.  Do not use any sheets or blankets that are too big for your bed. They should not hang down onto the floor.  Have a firm chair that has side arms. You can use this for support while you get dressed.  Do not have throw rugs and other things on the floor that can make you trip. What can I do in the kitchen?  Clean up any spills right away.  Avoid walking on wet floors.  Keep items that you use a lot in easy-to-reach places.  If you need to reach something above you, use a strong step stool that has a grab bar.  Keep electrical cords out of the way.  Do not use floor polish or wax that makes floors slippery. If you must use wax, use non-skid floor wax.  Do not have throw rugs and other things on the floor that can make you trip. What can I do with my stairs?  Do not leave any items on the stairs.  Make sure that there are handrails on both sides of the stairs and use them. Fix handrails that are broken or loose. Make sure that handrails are as long as the stairways.  Check any carpeting to make sure that it is firmly attached to the stairs. Fix any carpet that is loose or worn.  Avoid having throw rugs at the top or bottom of the stairs. If you do have throw rugs, attach them to the floor with carpet tape.  Make sure that you have a light switch at the top of the stairs and the  bottom of the stairs. If you do not have them, ask someone to add them for you. What else can I do to help prevent falls?  Wear shoes that:  Do not have high heels.  Have rubber bottoms.  Are comfortable and fit you well.  Are closed at the toe. Do not wear sandals.  If you use a stepladder:  Make sure that it is fully opened. Do not climb a closed stepladder.  Make sure that both sides of the stepladder are locked into place.  Ask someone to hold it for you, if possible.  Clearly mark and make sure that you can see:  Any grab bars or handrails.  First and last steps.  Where the edge of each step is.  Use tools that help you move around (mobility aids) if they are needed. These include:  Canes.  Walkers.  Scooters.  Crutches.  Turn on the lights when you go into a dark area. Replace any light bulbs as soon as they burn out.  Set up your furniture so you have a clear path. Avoid moving your furniture around.  If any of your floors are uneven, fix them.  If there are any pets around you, be aware of where they are.  Review your medicines with your doctor. Some medicines can make you feel dizzy. This can increase your chance of falling. Ask your doctor what other things that you can do to help prevent falls. This information is not intended to replace advice given to you by your health care provider. Make sure you discuss any questions you have with your health care provider. Document Released: 12/24/2008 Document Revised: 08/05/2015 Document Reviewed: 04/03/2014 Elsevier Interactive Patient Education  2017 [employer].  Steps to Quit Smoking Smoking tobacco can be bad for your health. It can also affect almost every organ in your body. Smoking puts you and people around you at risk for many serious long-lasting (chronic) diseases. Quitting smoking is hard, but it is one of the best things that you can do for your health. It is never too late to quit. What are  the benefits of quitting smoking? When you quit smoking, you lower your risk for getting serious diseases and conditions. They can include:  Lung cancer or lung disease.  Heart disease.  Stroke.  Heart attack.  Not being  able to have children (infertility).  Weak bones (osteoporosis) and broken bones (fractures).  If you have coughing, wheezing, and shortness of breath, those symptoms may get better when you quit. You may also get sick less often. If you are pregnant, quitting smoking can help to lower your chances of having a baby of low birth weight. What can I do to help me quit smoking? Talk with your doctor about what can help you quit smoking. Some things you can do (strategies) include:  Quitting smoking totally, instead of slowly cutting back how much you smoke over a period of time.  Going to in-person counseling. You are more likely to quit if you go to many counseling sessions.  Using resources and support systems, such as: ? Database administrator with a Social worker. ? Phone quitlines. ? Careers information officer. ? Support groups or group counseling. ? Text messaging programs. ? Mobile phone apps or applications.  Taking medicines. Some of these medicines may have nicotine in them. If you are pregnant or breastfeeding, do not take any medicines to quit smoking unless your doctor says it is okay. Talk with your doctor about counseling or other things that can help you.  Talk with your doctor about using more than one strategy at the same time, such as taking medicines while you are also going to in-person counseling. This can help make quitting easier. What things can I do to make it easier to quit? Quitting smoking might feel very hard at first, but there is a lot that you can do to make it easier. Take these steps:  Talk to your family and friends. Ask them to support and encourage you.  Call phone quitlines, reach out to support groups, or work with a Social worker.  Ask  people who smoke to not smoke around you.  Avoid places that make you want (trigger) to smoke, such as: ? Bars. ? Parties. ? Smoke-break areas at work.  Spend time with people who do not smoke.  Lower the stress in your life. Stress can make you want to smoke. Try these things to help your stress: ? Getting regular exercise. ? Deep-breathing exercises. ? Yoga. ? Meditating. ? Doing a body scan. To do this, close your eyes, focus on one area of your body at a time from head to toe, and notice which parts of your body are tense. Try to relax the muscles in those areas.  Download or buy apps on your mobile phone or tablet that can help you stick to your quit plan. There are many free apps, such as QuitGuide from the [employer] Office manager for Disease Control and Prevention). You can find more support from smokefree.gov and other websites.  This information is not intended to replace advice given to you by your health care provider. Make sure you discuss any questions you have with your health care provider. Document Released: 12/24/2008 Document Revised: 10/26/2015 Document Reviewed: 07/14/2014 Elsevier Interactive Patient Education  2018 [employer].

## 2016-12-06 NOTE — Progress Notes (Signed)
Subjective:   [name] is a 66 y.o. female who presents for Medicare Annual (Subsequent) preventive examination.  Review of Systems:  Cardiac Risk Factors include: advanced age (>2men, >38 women);diabetes mellitus;dyslipidemia;sedentary lifestyle;smoking/ tobacco exposure     Objective:     Vitals: BP 140/72   Pulse 80   Temp 98.1 F (36.7 C) (Temporal)   Ht 5' (1.524 m)   Wt 149 lb 1.9 oz (67.6 kg)   BMI 29.12 kg/m   Body mass index is 29.12 kg/m.   Tobacco History  Smoking Status  . Light Tobacco Smoker  . Packs/day: 0.25  . Years: 7.00  . Types: Cigarettes  Smokeless Tobacco  . Never Used    Comment: continues to smoke 1/4 pack a day      Ready to quit: Not Answered Counseling given: Not Answered   Past Medical History:  Diagnosis Date  . Anemia   . Anxiety    takes Ativan daily  . Arthritis   . Bipolar disorder ([place])    takes Risperdal nightly  . Blood transfusion   . Cancer ([place])    In her gum  . Carpal tunnel syndrome of right wrist 05/23/2011  . Cervical disc disorder with radiculopathy of cervical region 10/31/2012  . Chronic back pain   . Chronic idiopathic constipation   . Colon polyps   . COPD (chronic obstructive pulmonary disease) with chronic bronchitis ([place]) 09/16/2013   Office Spirometry 10/30/2013-submaximal effort based on appearance of loop and curve. Numbers would fit with severe restriction but her physiologic capability may be better than this. FVC 0.91/44%, and 10.74/45%, FEV1/FVC 0.81, FEF 25-75% 1.43/69%    . Depression    takes Zoloft daily  . Diabetes mellitus    Type II  . Diverticulosis    TCS 9/08 by Dr. Delfin Edis for diarrhea . Bx for micro scopic colitis negative.   . Fibromyalgia   . Frequent falls   . GERD (gastroesophageal reflux disease)    takes Aciphex daily  . Glaucoma    eye drops daily  . Gum symptoms    infection on antibiotic  . Hemiplegia affecting non-dominant side, post-stroke 08/02/2011  .  Hyperlipidemia    takes Crestor daily  . Hypertension    takes Amlodipine,Metoprolol,and Clonidine daily  . Hypothyroidism    takes Synthroid daily  . IBS (irritable bowel syndrome)   . Insomnia    takes Trazodone nightly  . Metabolic encephalopathy 1/44/8185  . Migraines    chronic headaches  . Mononeuritis lower limb   . Osteoporosis   . Pancreatitis 2006   due to Depakote with normal EUS   . Schatzki's ring    non critical / EGD with ED 8/2011with RMR  . Seizures ([place])    takes Lamictal daily.Last seizure 3 yrs ago  . Sleep apnea    on CPAP  . Stroke [facility])    left sided weakness  . Tubular adenoma of colon    Past Surgical History:  Procedure Laterality Date  . ABDOMINAL HYSTERECTOMY  1978  . BACK SURGERY  July 2012  . BACTERIAL OVERGROWTH TEST N/A 05/05/2013   Procedure: BACTERIAL OVERGROWTH TEST;  Surgeon: Daneil Dolin, MD;  Location[facility];  Service: Endoscopy;  Laterality: N/A;  7:30  . BIOPSY THYROID  2009  . BRAIN SURGERY  11/2011   resection of meningioma  . BREAST REDUCTION SURGERY  1994  . CARDIAC CATHETERIZATION  05/10/2005   normal coronaries, normal LV systolic  function and EF (Dr. Jackie Plum)  . CARPAL TUNNEL RELEASE Left 07/22/04   Dr. Aline Brochure  . CATARACT EXTRACTION Bilateral   . CHOLECYSTECTOMY  1984  . COLONOSCOPY N/A 09/25/2012   QQI:[id number] diverticulosis.  colonic polyp-removed : tubular adenoma  . CRANIOTOMY  11/23/2011   Procedure: CRANIOTOMY TUMOR EXCISION;  Surgeon: Hosie Spangle, MD;  Location: [place] [facility];  Service: Neurosurgery;  Laterality: N/A;  Craniotomy for tumor resection  . ESOPHAGOGASTRODUODENOSCOPY  12/29/2010   Rourk-Retained food in the esophagus and stomach, small hiatal hernia, status post Maloney dilation of the esophagus  . ESOPHAGOGASTRODUODENOSCOPY N/A 09/25/2012   JJH:[id number] atonic baggy esophagus status post Maloney dilation 66 F. Hiatal hernia  . GIVENS CAPSULE STUDY N/A 01/15/2013   NORMAL.   . IR GENERIC  HISTORICAL  03/17/2016   IR RADIOLOGIST EVAL & MGMT 03/17/2016 [facility]INTER[facility] RAD  . LESION REMOVAL N/A 05/31/2015   Procedure: REMOVAL RIGHT AND LEFT LESIONS OF MANDIBLE;  Surgeon: Diona Browner, DDS;  Location: [place];  Service: Oral Surgery;  Laterality: N/A;  . MALONEY DILATION  12/29/2010   RMR;  . NM MYOCAR PERF WALL MOTION  2006   "relavtiely normal" persantine, mild anterior thinning (breast attenuation artifact), no region of scar/ischemia  . OVARIAN CYST REMOVAL    . RECTOCELE REPAIR N/A 06/29/2015   Procedure: POSTERIOR REPAIR (RECTOCELE);  Surgeon: Jonnie Kind, MD;  Location: [facility];  Service: Gynecology;  Laterality: N/A;  . REDUCTION MAMMAPLASTY Bilateral   . SPINE SURGERY  09/29/2010   Dr. Rolena Infante  . surgical excision of 3 tumors from right thigh and right buttock  and left upper thigh  2010  . TOOTH EXTRACTION Bilateral 12/14/2014   Procedure: REMOVAL OF BILATERAL MANDIBULAR EXOSTOSES;  Surgeon: Diona Browner, DDS;  Location: [place];  Service: Oral Surgery;  Laterality: Bilateral;  . TRANSTHORACIC ECHOCARDIOGRAM  2010   EF 60-65%, mild conc LVH, grade 1 diastolic dysfunction; mildly calcified MV annulus with mildly thickened leaflets, mildly calcified MR annulus   Family History  Problem Relation Age of Onset  . Heart attack Mother        HTN  . Pneumonia Father   . Kidney failure Father   . Diabetes Father   . Pancreatic cancer Sister   . Diabetes Brother   . Hypertension Brother   . Diabetes Brother   . Cancer Sister        breast   . Hypertension Son   . Sleep apnea Son   . Cancer Sister        pancreatic  . Stroke Maternal Grandmother   . Heart attack Maternal Grandfather   . Alcohol abuse Maternal Uncle   . Colon cancer Neg Hx   . Anesthesia problems Neg Hx   . Hypotension Neg Hx   . Malignant hyperthermia Neg Hx   . Pseudochol deficiency Neg Hx    History  Sexual Activity  . Sexual activity: Not Currently    Outpatient Encounter Prescriptions as of  12/06/2016  Medication Sig  . albuterol (PROVENTIL HFA;VENTOLIN HFA) 108 (90 Base) MCG/ACT inhaler Inhale 1-2 puffs every 6 hours as needed for wheezing, shortness of breath  . aspirin EC 81 MG tablet Take 81 mg by mouth daily.  . Cholecalciferol (VITAMIN D) 2000 units CAPS Take 1 capsule by mouth daily.  . cloNIDine (CATAPRES) 0.3 MG tablet TAKE 1 TABLET BY MOUTH EVERY EIGHT HOURS. ONCE AT 8AM, 4PM, AND 12 MIDNIGHT.  [name] diclofenac sodium (VOLTAREN) 1 % GEL Apply  2 g topically daily as needed (Pain).  [name] dicyclomine (BENTYL) 10 MG capsule Take 10 mg by mouth 4 (four) times daily -  before meals and at bedtime.  . docusate sodium (COLACE) 100 MG capsule Take 200 mg by mouth at bedtime.  . fluticasone (FLONASE) 50 MCG/ACT nasal spray Place 2 sprays into both nostrils daily.  . hydroxychloroquine (PLAQUENIL) 200 MG tablet Take 200 mg by mouth 2 (two) times daily.   . insulin aspart (NOVOLOG FLEXPEN) 100 UNIT/ML FlexPen INJECT 17 TO 24 UNITS INTO THE SKIN 3 (THREE) TIMES DAILY WITH MEALS.  [name] Insulin Glargine (TOUJEO SOLOSTAR) 300 UNIT/ML SOPN Inject 30 Units into the skin at bedtime.  . lamoTRIgine (LAMICTAL) 100 MG tablet Take 1 tablet (100 mg total) by mouth 2 (two) times daily.  [name] levothyroxine (SYNTHROID, LEVOTHROID) 50 MCG tablet TAKE 1 TABLET BY MOUTH ONCE DAILY AND 1/2 TABLET ON SUNDAYS.  [name] LORazepam (ATIVAN) 0.5 MG tablet Take 0.5 tablets (0.25 mg total) by mouth 3 (three) times daily.  [name] losartan (COZAAR) 50 MG tablet TAKE ONE TABLET BY MOUTH DAILY.  [name] lubiprostone (AMITIZA) 24 MCG capsule Take 1 capsule (24 mcg total) by mouth 2 (two) times daily with a meal.  . metoprolol tartrate (LOPRESSOR) 50 MG tablet Take 1 tablet (50 mg total) by mouth 2 (two) times daily. Please call and schedule appointment  . montelukast (SINGULAIR) 10 MG tablet TAKE ONE TABLET BY MOUTH ONCE DAILY.  . Multiple Vitamins-Minerals (ONE-A-DAY 50 PLUS PO) Take 1 tablet by mouth daily.  . ondansetron (ZOFRAN ODT) 8 MG  disintegrating tablet Take 1 tablet (8 mg total) by mouth every 8 (eight) hours as needed for nausea or vomiting.  . pregabalin (LYRICA) 75 MG capsule Take 1 capsule (75 mg total) by mouth daily.  . RABEprazole (ACIPHEX) 20 MG tablet TAKE 1 TABLET BY MOUTH TWICE DAILY.  [name] RESTASIS 0.05 % ophthalmic emulsion Place 1 drop into both eyes 2 (two) times daily.   . risperiDONE (RISPERDAL) 0.5 MG tablet Take 1 tablet (0.5 mg total) by mouth at bedtime.  . rosuvastatin (CRESTOR) 5 MG tablet TAKE 1 TABLET BY MOUTH AT BEDTIME.  [name] sertraline (ZOLOFT) 100 MG tablet Take 1 tablet (100 mg total) by mouth daily.  . traZODone (DESYREL) 100 MG tablet Take 1 tablet (100 mg total) by mouth at bedtime.   Facility-Administered Encounter Medications as of 12/06/2016  Medication  . acetaminophen (TYLENOL) tablet 325 mg    Activities of Daily Living In your present state of health, do you have any difficulty performing the following activities: 12/06/2016 10/15/2016  Hearing? N N  Vision? N N  Difficulty concentrating or making decisions? N N  Walking or climbing stairs? Y N  Dressing or bathing? Y N  Doing errands, shopping? Y N  Preparing Food and eating ? Y -  Using the Toilet? N -  In the past six months, have you accidently leaked urine? Y -  Do you have problems with loss of bowel control? N -  Managing your Medications? N -  Managing your Finances? Y -  Housekeeping or managing your Housekeeping? Y -  Some recent data might be hidden    Patient Care Team: [name], MD as PCP - General Rourk, Cristopher Estimable, MD (Gastroenterology) Deneise Lever, MD as Consulting Physician (Pulmonary Disease) Jovita Gamma, MD as Consulting Physician (Neurosurgery) Suella Broad, MD as Consulting Physician (Physical Medicine and Rehabilitation) Pieter Partridge, DO as Consulting Physician (Neurology) Lavonna Monarch,  MD as Consulting Physician (Dermatology) Leta Baptist, MD as Consulting Physician  (Otolaryngology) Latanya Maudlin, MD as Consulting Physician (Orthopedic Surgery) Cloria Spring, MD as Consulting Physician ([place]) Melina Schools, MD as Consulting Physician (Orthopedic Surgery) Lendon Colonel, NP as Nurse Practitioner (Nurse Practitioner) Milus Banister, MD as Attending Physician (Gastroenterology) Pyrtle, Lajuan Lines, MD as Consulting Physician (Gastroenterology) Monico Hoar, PT as Physical Therapist (Physical Therapy) Conrad [place], MD as Consulting Physician (Vascular Surgery) Inocencio Homes, DPM as Consulting Physician (Podiatry) Newt Minion, MD as Consulting Physician (Orthopedic Surgery)    Assessment:    Exercise Activities and Dietary recommendations Current Exercise Habits: The patient does not participate in regular exercise at present, Exercise limited by: respiratory conditions(s)  Goals    . Exercise 3x per week (30 min per time)          Recommend starting chair exercises at least 3 days a week for 30-45 minutes at a time as tolerated.      . Quit smoking / using tobacco      Fall Risk Fall Risk  12/06/2016 09/25/2016 08/09/2016 01/20/2016 09/08/2015  Falls in the past year? Yes Yes Yes Yes Yes  Number falls in past yr: 2 or more 2 or more 2 or more 2 or more 1  Injury with Fall? No - No No No  Comment - - - - -  Risk Factor Category  High Fall Risk - High Fall Risk - -  Risk for fall due to : History of fall(s);Impaired balance/gait;Impaired mobility - - - -  Follow up Falls evaluation completed;Education provided;Falls prevention discussed - Falls evaluation completed - Falls evaluation completed   Depression Screen PHQ 2/9 Scores 12/06/2016 09/25/2016 04/21/2015 03/19/2015  PHQ - 2 Score 2 2 1 2   PHQ- 9 Score 5 9 - 9     Cognitive Function MMSE - Mini Mental State Exam 03/20/2016 09/08/2015 07/02/2014 12/08/2013  Not completed: (No Data) - - -  Orientation to time - 5 5 5   Orientation to Place - 3 5 5   Registration - 2 3 2     Attention/ Calculation - 5 3 5   Recall - 2 3 2   Language- name 2 objects - 2 2 2   Language- repeat - 0 1 1  Language- follow 3 step command - 3 3 3   Language- read & follow direction - 1 1 1   Write a sentence - 1 0 1  Copy design - 0 0 0  Total score - 24 26 27      6CIT Screen 12/06/2016  What Year? 0 points  What month? 0 points  What time? 0 points  Count back from 20 0 points  Months in reverse 0 points  Repeat phrase 0 points  Total Score 0    Immunization History  Administered Date(s) Administered  . H1N1 01/09/2008  . Influenza Split 12/12/2010, 11/16/2011  . Influenza Whole 11/23/2006, 12/10/2007, 12/11/2008, 11/16/2009  . Influenza,inj,Quad PF,6+ Mos 11/28/2012, 11/20/2013, 11/10/2014, 12/06/2015, 11/21/2016  . Pneumococcal Conjugate-13 09/16/2013  . Pneumococcal Polysaccharide-23 08/03/2003, 12/11/2008, 08/22/2010, 08/31/2015  . Td 09/23/2003  . Tdap 12/29/2011   Screening Tests Health Maintenance  Topic Date Due  . OPHTHALMOLOGY EXAM  10/07/2016  . HEMOGLOBIN A1C  04/17/2017  . FOOT EXAM  10/15/2017  . MAMMOGRAM  09/08/2018  . COLONOSCOPY  02/09/2021  . TETANUS/TDAP  12/28/2021  . INFLUENZA VACCINE  Completed  . DEXA SCAN  Completed  . Hepatitis C Screening  Completed  .  PNA vac Low Risk Adult  Completed      Plan:   I have personally reviewed and noted the following in the patient's chart:   . Medical and social history . Use of alcohol, tobacco or illicit drugs  . Current medications and supplements . Functional ability and status . Nutritional status . Physical activity . Advanced directives . List of other physicians . Hospitalizations, surgeries, and ER visits in previous 12 months . Vitals . Screenings to include cognitive, depression, and falls . Referrals and appointments  In addition, I have reviewed and discussed with patient certain preventive protocols, quality metrics, and best practice recommendations. A written personalized care  plan for preventive services as well as general preventive health recommendations were provided to patient.     Stormy Fabian, LPN  2/77/4128

## 2016-12-07 DIAGNOSIS — I110 Hypertensive heart disease with heart failure: Secondary | ICD-10-CM | POA: Diagnosis not present

## 2016-12-07 DIAGNOSIS — Z9181 History of falling: Secondary | ICD-10-CM | POA: Diagnosis not present

## 2016-12-07 DIAGNOSIS — G40909 Epilepsy, unspecified, not intractable, without status epilepticus: Secondary | ICD-10-CM | POA: Diagnosis not present

## 2016-12-07 DIAGNOSIS — E1142 Type 2 diabetes mellitus with diabetic polyneuropathy: Secondary | ICD-10-CM | POA: Diagnosis not present

## 2016-12-07 DIAGNOSIS — I509 Heart failure, unspecified: Secondary | ICD-10-CM | POA: Diagnosis not present

## 2016-12-07 DIAGNOSIS — F250 Schizoaffective disorder, bipolar type: Secondary | ICD-10-CM | POA: Diagnosis not present

## 2016-12-07 DIAGNOSIS — I69354 Hemiplegia and hemiparesis following cerebral infarction affecting left non-dominant side: Secondary | ICD-10-CM | POA: Diagnosis not present

## 2016-12-07 DIAGNOSIS — F17210 Nicotine dependence, cigarettes, uncomplicated: Secondary | ICD-10-CM | POA: Diagnosis not present

## 2016-12-07 DIAGNOSIS — J449 Chronic obstructive pulmonary disease, unspecified: Secondary | ICD-10-CM | POA: Diagnosis not present

## 2016-12-11 ENCOUNTER — Telehealth: Payer: Self-pay | Admitting: Family Medicine

## 2016-12-11 DIAGNOSIS — I69354 Hemiplegia and hemiparesis following cerebral infarction affecting left non-dominant side: Secondary | ICD-10-CM | POA: Diagnosis not present

## 2016-12-11 DIAGNOSIS — I509 Heart failure, unspecified: Secondary | ICD-10-CM | POA: Diagnosis not present

## 2016-12-11 DIAGNOSIS — Z9181 History of falling: Secondary | ICD-10-CM | POA: Diagnosis not present

## 2016-12-11 DIAGNOSIS — F250 Schizoaffective disorder, bipolar type: Secondary | ICD-10-CM | POA: Diagnosis not present

## 2016-12-11 DIAGNOSIS — I110 Hypertensive heart disease with heart failure: Secondary | ICD-10-CM | POA: Diagnosis not present

## 2016-12-11 DIAGNOSIS — F17210 Nicotine dependence, cigarettes, uncomplicated: Secondary | ICD-10-CM | POA: Diagnosis not present

## 2016-12-11 DIAGNOSIS — E1142 Type 2 diabetes mellitus with diabetic polyneuropathy: Secondary | ICD-10-CM | POA: Diagnosis not present

## 2016-12-11 DIAGNOSIS — G40909 Epilepsy, unspecified, not intractable, without status epilepticus: Secondary | ICD-10-CM | POA: Diagnosis not present

## 2016-12-11 DIAGNOSIS — J449 Chronic obstructive pulmonary disease, unspecified: Secondary | ICD-10-CM | POA: Diagnosis not present

## 2016-12-11 IMAGING — DX DG ABDOMEN 2V
3 series · 3 of 3 positions shown · non-contrast
Comparison: CT abdomen and pelvis of 06/02/2014

CLINICAL DATA: Abdominal pain particularly along the left flank, no
injury

EXAM:
ABDOMEN - 2 VIEW

[abdomen erect]
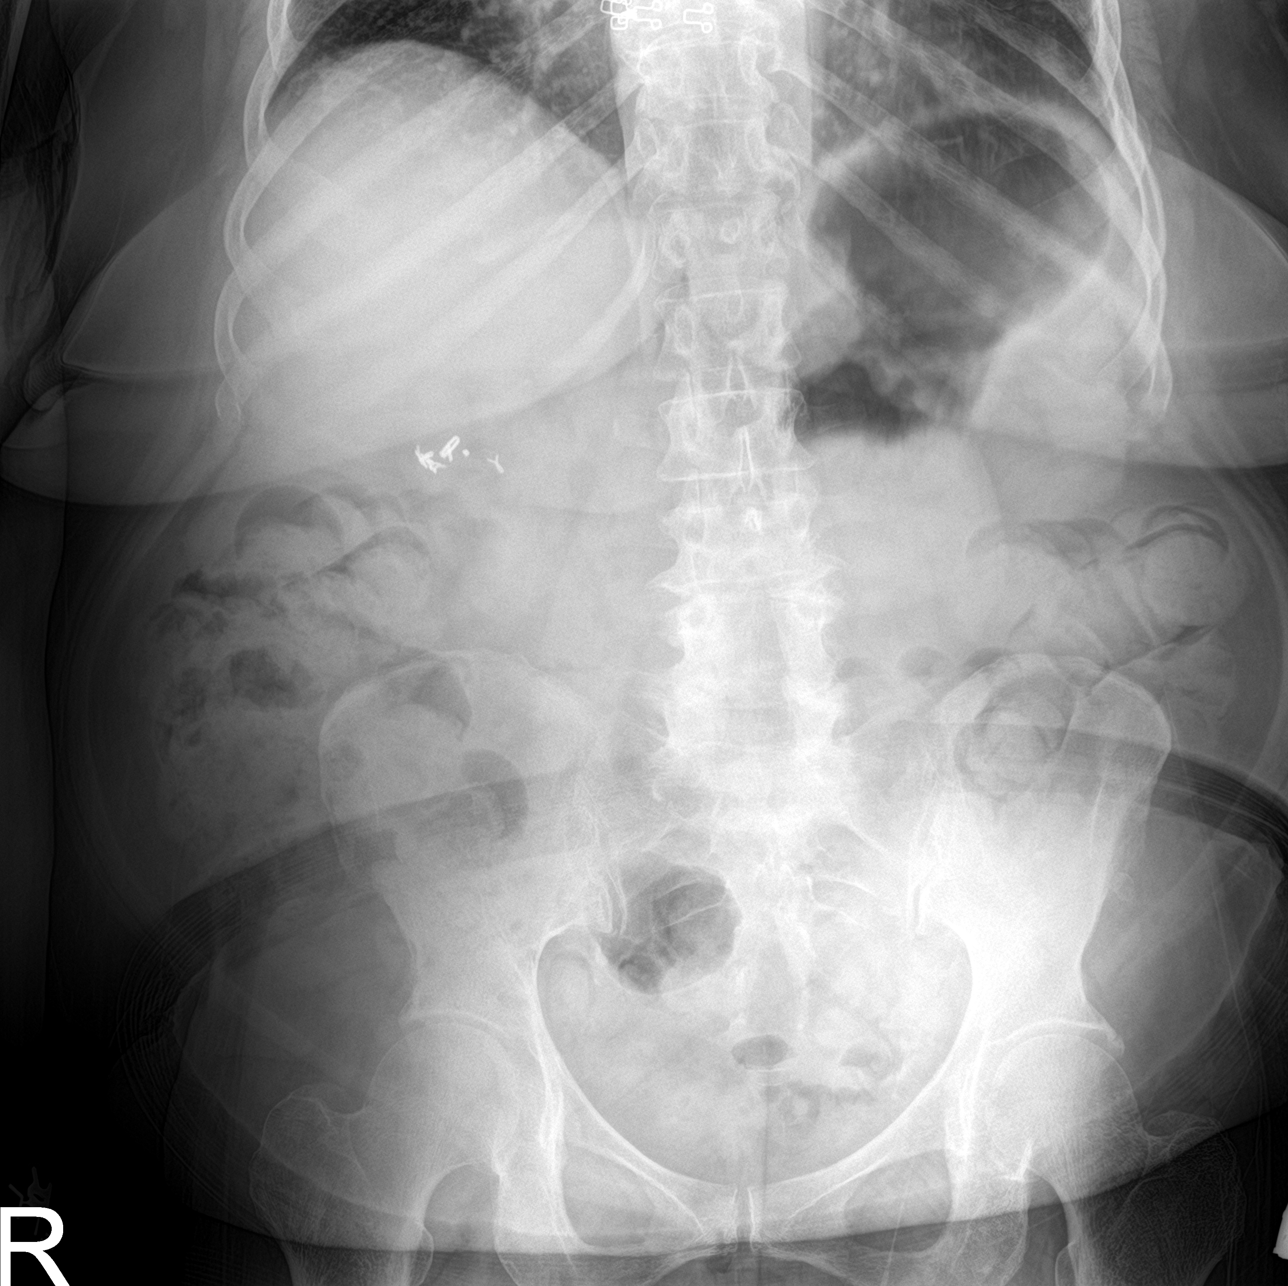

[abdomen supine (1 of 2)]
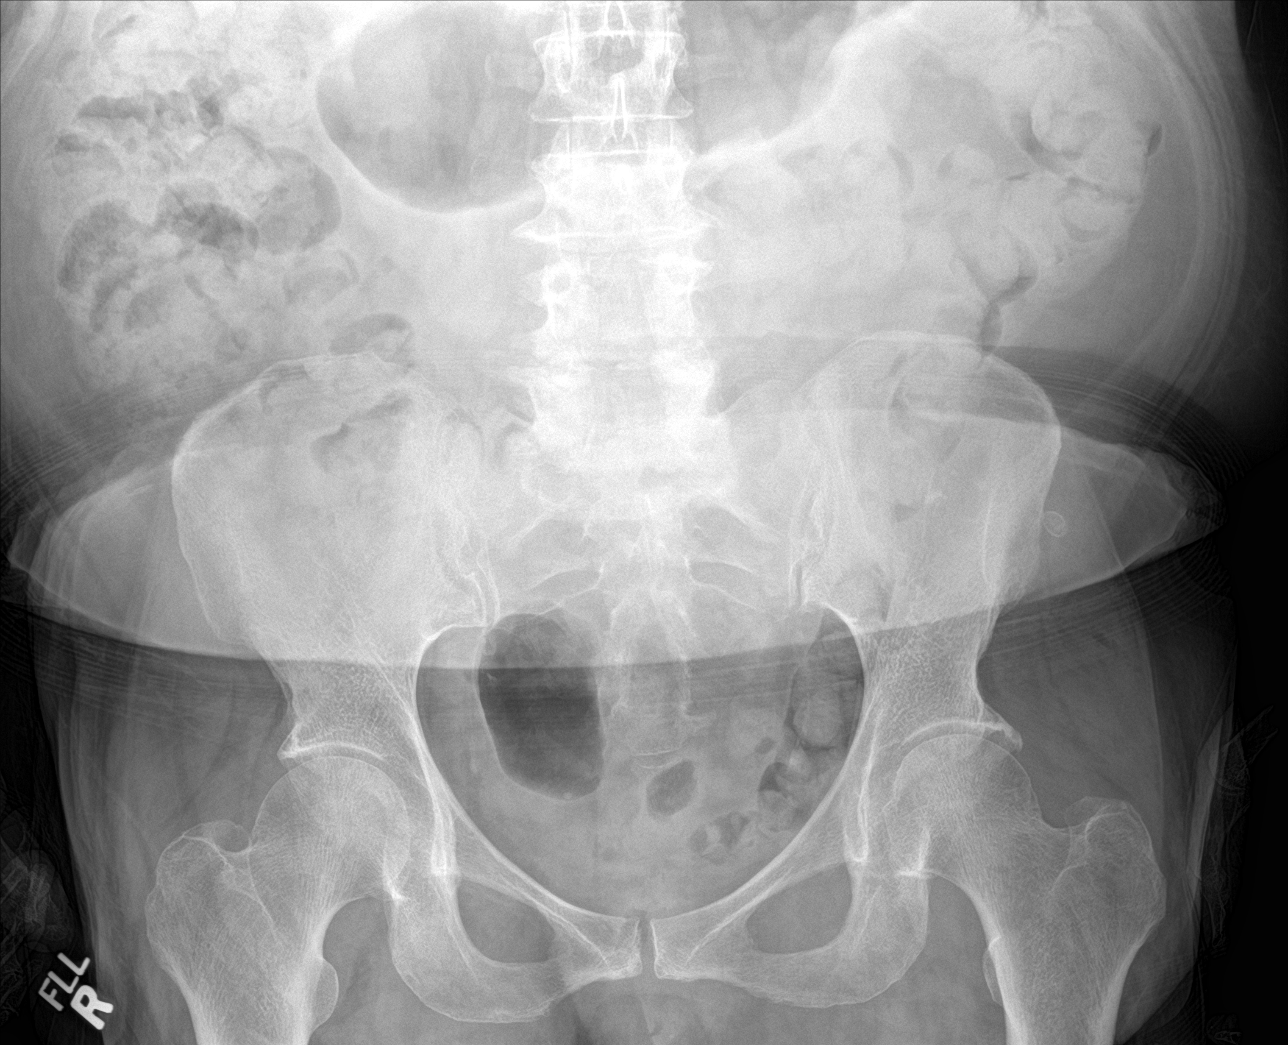

[abdomen supine (2 of 2)]
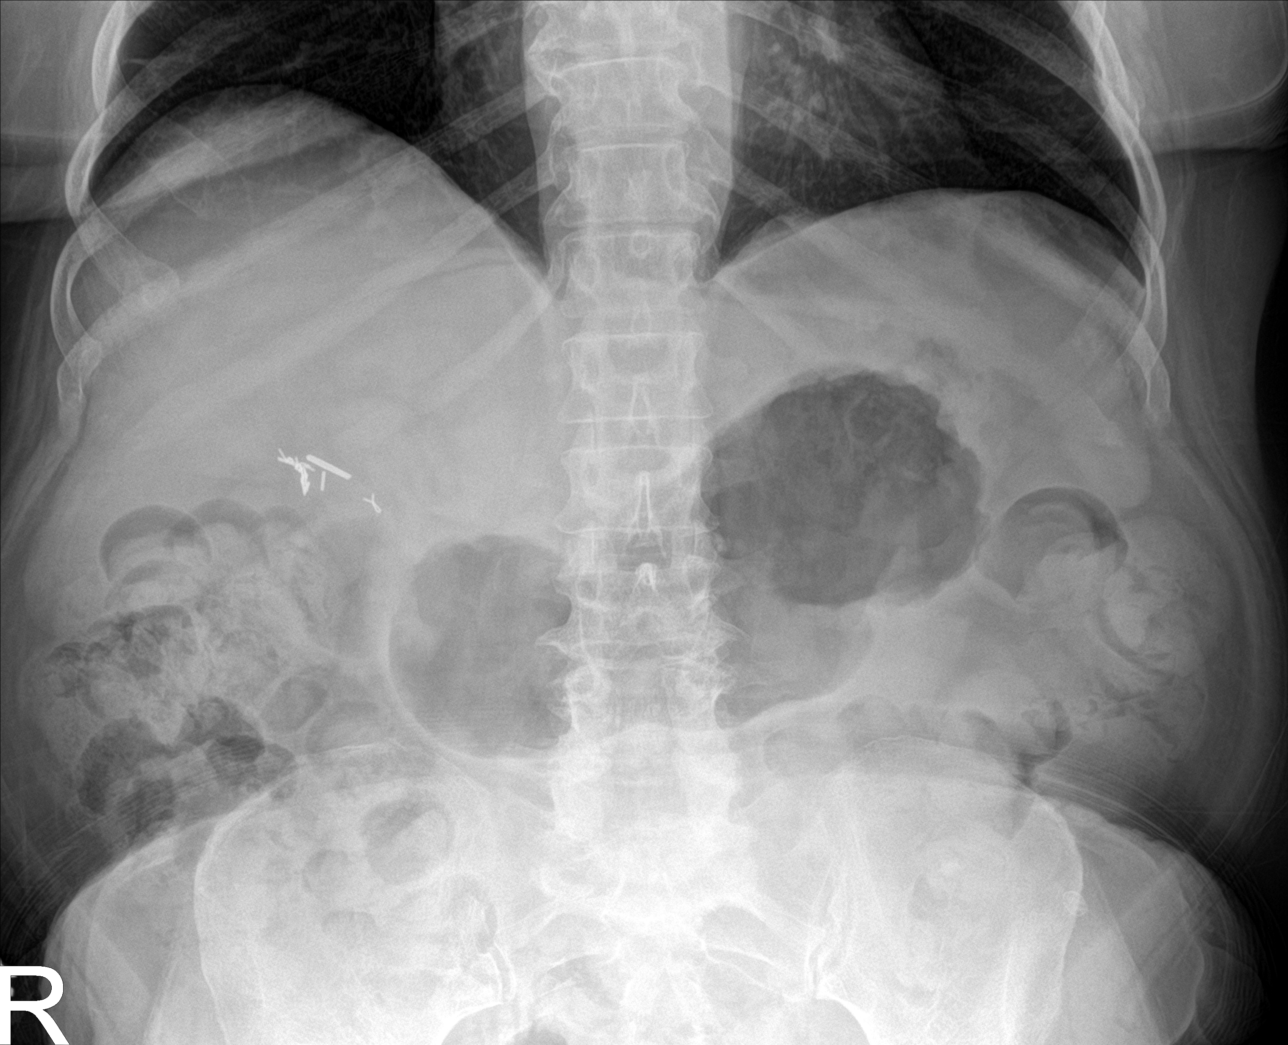

[3 of 3 positions shown; findings below may reference images not displayed]

FINDINGS: Supine and erect views of the abdomen show a large amount of feces
throughout the colon. No bowel obstruction is seen. No free air is
noted on the erect view. No opaque calculi are seen. Surgical clips
are noted in the right upper quadrant from prior cholecystectomy.
Postoperative changes are noted in the lower lumbar spine.
IMPRESSION: 1. Large amount of feces throughout the colon.
2. No bowel obstruction.  No free air.

## 2016-12-11 NOTE — Telephone Encounter (Signed)
Patient calling wanting to speak with [name] about the symptoms that she is having.  She mentions having had a stroke in the past and would like to discuss those symptoms.  She would not offer anymore detail and kept repeating that she need to talk with [name]   cb  [phone]

## 2016-12-12 ENCOUNTER — Telehealth: Payer: Self-pay | Admitting: Family Medicine

## 2016-12-12 NOTE — Telephone Encounter (Signed)
Patient would like to speak to a nurse about symptoms she is having in her hands. (i advised her that her msg from yesterday has been sent as well) but she states this is very important.  Cb#: [phone]

## 2016-12-13 ENCOUNTER — Ambulatory Visit: Payer: Medicare HMO

## 2016-12-14 DIAGNOSIS — G40909 Epilepsy, unspecified, not intractable, without status epilepticus: Secondary | ICD-10-CM | POA: Diagnosis not present

## 2016-12-14 DIAGNOSIS — F17210 Nicotine dependence, cigarettes, uncomplicated: Secondary | ICD-10-CM | POA: Diagnosis not present

## 2016-12-14 DIAGNOSIS — I69354 Hemiplegia and hemiparesis following cerebral infarction affecting left non-dominant side: Secondary | ICD-10-CM | POA: Diagnosis not present

## 2016-12-14 DIAGNOSIS — F250 Schizoaffective disorder, bipolar type: Secondary | ICD-10-CM | POA: Diagnosis not present

## 2016-12-14 DIAGNOSIS — I110 Hypertensive heart disease with heart failure: Secondary | ICD-10-CM | POA: Diagnosis not present

## 2016-12-14 DIAGNOSIS — Z9181 History of falling: Secondary | ICD-10-CM | POA: Diagnosis not present

## 2016-12-14 DIAGNOSIS — I509 Heart failure, unspecified: Secondary | ICD-10-CM | POA: Diagnosis not present

## 2016-12-14 DIAGNOSIS — E1142 Type 2 diabetes mellitus with diabetic polyneuropathy: Secondary | ICD-10-CM | POA: Diagnosis not present

## 2016-12-14 DIAGNOSIS — J449 Chronic obstructive pulmonary disease, unspecified: Secondary | ICD-10-CM | POA: Diagnosis not present

## 2016-12-14 NOTE — Telephone Encounter (Signed)
Called & LVM asking to call back with information concerning symptoms in hands

## 2016-12-16 ENCOUNTER — Emergency Department (HOSPITAL_COMMUNITY)
Admission: EM | Admit: 2016-12-16 | Discharge: 2016-12-16 | Disposition: A | Discharge status: Home / Self Care | Payer: Medicare HMO | Attending: Emergency Medicine | Admitting: Emergency Medicine

## 2016-12-16 ENCOUNTER — Encounter (HOSPITAL_COMMUNITY): Payer: Self-pay

## 2016-12-16 ENCOUNTER — Emergency Department (HOSPITAL_COMMUNITY): Payer: Medicare HMO

## 2016-12-16 DIAGNOSIS — J449 Chronic obstructive pulmonary disease, unspecified: Secondary | ICD-10-CM | POA: Diagnosis not present

## 2016-12-16 DIAGNOSIS — R1084 Generalized abdominal pain: Secondary | ICD-10-CM | POA: Diagnosis not present

## 2016-12-16 DIAGNOSIS — M791 Myalgia, unspecified site: Secondary | ICD-10-CM | POA: Diagnosis not present

## 2016-12-16 DIAGNOSIS — Z7982 Long term (current) use of aspirin: Secondary | ICD-10-CM | POA: Diagnosis not present

## 2016-12-16 DIAGNOSIS — N3000 Acute cystitis without hematuria: Secondary | ICD-10-CM

## 2016-12-16 DIAGNOSIS — Z794 Long term (current) use of insulin: Secondary | ICD-10-CM | POA: Diagnosis not present

## 2016-12-16 DIAGNOSIS — I10 Essential (primary) hypertension: Secondary | ICD-10-CM | POA: Diagnosis not present

## 2016-12-16 DIAGNOSIS — K5900 Constipation, unspecified: Secondary | ICD-10-CM | POA: Diagnosis not present

## 2016-12-16 DIAGNOSIS — E039 Hypothyroidism, unspecified: Secondary | ICD-10-CM | POA: Diagnosis not present

## 2016-12-16 DIAGNOSIS — F17210 Nicotine dependence, cigarettes, uncomplicated: Secondary | ICD-10-CM | POA: Diagnosis not present

## 2016-12-16 DIAGNOSIS — R109 Unspecified abdominal pain: Secondary | ICD-10-CM

## 2016-12-16 DIAGNOSIS — R110 Nausea: Secondary | ICD-10-CM | POA: Diagnosis not present

## 2016-12-16 DIAGNOSIS — R1031 Right lower quadrant pain: Secondary | ICD-10-CM | POA: Diagnosis present

## 2016-12-16 DIAGNOSIS — M546 Pain in thoracic spine: Secondary | ICD-10-CM | POA: Diagnosis not present

## 2016-12-16 DIAGNOSIS — K2970 Gastritis, unspecified, without bleeding: Secondary | ICD-10-CM | POA: Diagnosis not present

## 2016-12-16 DIAGNOSIS — E1142 Type 2 diabetes mellitus with diabetic polyneuropathy: Secondary | ICD-10-CM | POA: Insufficient documentation

## 2016-12-16 LAB — CBC WITH DIFFERENTIAL/PLATELET
Basophils Absolute: 0 10*3/uL (ref 0.0–0.1)
Basophils Relative: 0 %
Eosinophils Absolute: 0.2 10*3/uL (ref 0.0–0.7)
Eosinophils Relative: 3 %
HEMATOCRIT: 32.7 % — AB (ref 36.0–46.0)
HEMOGLOBIN: 9.8 g/dL — AB (ref 12.0–15.0)
LYMPHS ABS: 3 10*3/uL (ref 0.7–4.0)
LYMPHS PCT: 42 %
MCH: 22.3 pg — AB (ref 26.0–34.0)
MCHC: 30 g/dL (ref 30.0–36.0)
MCV: 74.3 fL — AB (ref 78.0–100.0)
MONOS PCT: 9 %
Monocytes Absolute: 0.6 10*3/uL (ref 0.1–1.0)
NEUTROS ABS: 3.4 10*3/uL (ref 1.7–7.7)
NEUTROS PCT: 46 %
Platelets: 252 10*3/uL (ref 150–400)
RBC: 4.4 MIL/uL (ref 3.87–5.11)
RDW: 14.3 % (ref 11.5–15.5)
WBC: 7.3 10*3/uL (ref 4.0–10.5)

## 2016-12-16 LAB — URINALYSIS, ROUTINE W REFLEX MICROSCOPIC
Bacteria, UA: NONE SEEN
Bilirubin Urine: NEGATIVE
Glucose, UA: NEGATIVE mg/dL
Hgb urine dipstick: NEGATIVE
Ketones, ur: NEGATIVE mg/dL
Nitrite: NEGATIVE
PH: 5 (ref 5.0–8.0)
Protein, ur: 30 mg/dL — AB
SPECIFIC GRAVITY, URINE: 1.024 (ref 1.005–1.030)

## 2016-12-16 LAB — COMPREHENSIVE METABOLIC PANEL
ALBUMIN: 3.7 g/dL (ref 3.5–5.0)
ALK PHOS: 121 U/L (ref 38–126)
ALT: 24 U/L (ref 14–54)
ANION GAP: 9 (ref 5–15)
AST: 25 U/L (ref 15–41)
BUN: 18 mg/dL (ref 6–20)
CALCIUM: 8.9 mg/dL (ref 8.9–10.3)
CHLORIDE: 112 mmol/L — AB (ref 101–111)
CO2: 24 mmol/L (ref 22–32)
Creatinine, Ser: 1.19 mg/dL — ABNORMAL HIGH (ref 0.44–1.00)
GFR calc non Af Amer: 47 mL/min — ABNORMAL LOW (ref >60)
GFR, EST AFRICAN AMERICAN: 54 mL/min — AB (ref >60)
GLUCOSE: 59 mg/dL — AB (ref 65–99)
Potassium: 3.5 mmol/L (ref 3.5–5.1)
SODIUM: 145 mmol/L (ref 135–145)
Total Bilirubin: 0.2 mg/dL — ABNORMAL LOW (ref 0.3–1.2)
Total Protein: 7.5 g/dL (ref 6.5–8.1)

## 2016-12-16 LAB — LIPASE, BLOOD: Lipase: 38 U/L (ref 11–51)

## 2016-12-16 MED ORDER — ONDANSETRON HCL 4 MG/2ML IJ SOLN
4.0000 mg | Freq: Once | INTRAMUSCULAR | Status: AC
  Administered 2016-12-16: 4 mg via INTRAVENOUS
  Filled 2016-12-16: qty 2

## 2016-12-16 MED ORDER — CIPROFLOXACIN HCL 250 MG PO TABS
500.0000 mg | ORAL_TABLET | Freq: Once | ORAL | Status: AC
  Administered 2016-12-16: 500 mg via ORAL
  Filled 2016-12-16: qty 2

## 2016-12-16 MED ORDER — CYCLOBENZAPRINE HCL 5 MG PO TABS: 5.0000 mg | ORAL_TABLET | Freq: Three times a day (TID) | ORAL | 0 refills | Status: DC | PRN

## 2016-12-16 MED ORDER — PREDNISONE 20 MG PO TABS: ORAL_TABLET | ORAL | 0 refills | Status: DC

## 2016-12-16 MED ORDER — KETOROLAC TROMETHAMINE 30 MG/ML IJ SOLN
15.0000 mg | Freq: Once | INTRAMUSCULAR | Status: AC
  Administered 2016-12-16: 15 mg via INTRAVENOUS
  Filled 2016-12-16: qty 1

## 2016-12-16 MED ORDER — LEVOFLOXACIN 500 MG PO TABS: 500.0000 mg | ORAL_TABLET | Freq: Every day | ORAL | 0 refills | Status: DC

## 2016-12-16 NOTE — ED Triage Notes (Signed)
Pt called ems for right lower back pain that radiates around to her right groin, states she has had dysuria and frequency.

## 2016-12-16 NOTE — ED Provider Notes (Signed)
[name] DEPT Provider Note   CSN: [id number] Arrival date & time: 12/16/16  0040  Time seen 12:45 AM   History   Chief Complaint Chief Complaint  Patient presents with  . Flank Pain    HPI [name] is a 66 y.o. female.  HPI  patient states yesterday she started getting dysuria, dribbling, and suprapubic abdominal pain. She denies any change in her urinary frequency or hematuria. She also has some urgency. She also reports some right-sided flank pain for the past couple of days. She states standing up makes it feel worse, nothing makes it feel better. She states she does take hydrocodone 3 times a day for about 3 months and that will help for several hours. She states her abdomen feels full and she has had nausea without vomiting. She states on October 3 she did a colonoscopy prep for constipation and had good relief however she states she feels like she's starting to fill back up again. Patient has had abdominal surgeries including a cholecystectomy, hysterectomy, and an ovarian cyst surgery. She also states she's followed by Dr. Nelva Bush and she has had back surgery in the past and she needs back surgery again however it is in a place that they cannot attempt it.   PCP [name], MD  Pain Management Dr Nelva Bush   Past Medical History:  Diagnosis Date  . Anemia   . Anxiety    takes Ativan daily  . Arthritis   . Bipolar disorder ([place])    takes Risperdal nightly  . Blood transfusion   . Cancer ([place])    In her gum  . Carpal tunnel syndrome of right wrist 05/23/2011  . Cervical disc disorder with radiculopathy of cervical region 10/31/2012  . Chronic back pain   . Chronic idiopathic constipation   . Colon polyps   . COPD (chronic obstructive pulmonary disease) with chronic bronchitis ([place]) 09/16/2013   Office Spirometry 10/30/2013-submaximal effort based on appearance of loop and curve. Numbers would fit with severe restriction but her physiologic capability may be  better than this. FVC 0.91/44%, and 10.74/45%, FEV1/FVC 0.81, FEF 25-75% 1.43/69%    . Depression    takes Zoloft daily  . Diabetes mellitus    Type II  . Diverticulosis    TCS 9/08 by Dr. Delfin Edis for diarrhea . Bx for micro scopic colitis negative.   . Fibromyalgia   . Frequent falls   . GERD (gastroesophageal reflux disease)    takes Aciphex daily  . Glaucoma    eye drops daily  . Gum symptoms    infection on antibiotic  . Hemiplegia affecting non-dominant side, post-stroke 08/02/2011  . Hyperlipidemia    takes Crestor daily  . Hypertension    takes Amlodipine,Metoprolol,and Clonidine daily  . Hypothyroidism    takes Synthroid daily  . IBS (irritable bowel syndrome)   . Insomnia    takes Trazodone nightly  . Metabolic encephalopathy 9/93/7169  . Migraines    chronic headaches  . Mononeuritis lower limb   . Osteoporosis   . Pancreatitis 2006   due to Depakote with normal EUS   . Schatzki's ring    non critical / EGD with ED 8/2011with RMR  . Seizures ([place])    takes Lamictal daily.Last seizure 3 yrs ago  . Sleep apnea    on CPAP  . Stroke [facility])    left sided weakness  . Tubular adenoma of colon     Patient Active Problem List  Diagnosis Date Noted  . Acute respiratory failure with hypoxia ([place])   . HCAP (healthcare-associated pneumonia)/Aspiration PNA 10/16/2016  . Acute metabolic encephalopathy 41/66/0630  . AKI (acute kidney injury) ([place]) 10/15/2016  . Hypotension   . Diabetic polyneuropathy associated with type 2 diabetes mellitus ([place]) 05/19/2016  . Smoker 04/24/2016  . Class 1 obesity with serious comorbidity and body mass index (BMI) of 33.0 to 33.9 in adult 04/24/2016  . Ulcer of toe, left, limited to breakdown of skin ([place]) 04/18/2016  . History of palpitations 08/09/2015  . Nausea without vomiting 08/09/2015  . Labile hypertension 08/03/2015  . Normal coronary arteries 08/03/2015  . Left-sided low back pain with left-sided sciatica 06/27/2015  .  Left flank pain 06/27/2015  . Diabetes mellitus type 2 in obese ([place]) 05/06/2015  . Multinodular goiter 05/06/2015  . Rectocele, female 04/27/2015  . Anal sphincter incontinence 04/27/2015  . Pelvic relaxation due to rectocele 03/30/2015  . Pulmonary hypertension ([place]) 02/22/2015  . Light cigarette smoker (1-9 cigarettes per day) 01/11/2015  . Migraine without aura and without status migrainosus, not intractable 07/02/2014  . Flatulence 02/18/2014  . Microcytic anemia 02/18/2014  . COPD (chronic obstructive pulmonary disease) with chronic bronchitis ([place]) 09/16/2013  . Hypothyroidism 08/16/2013  . Gastroparesis 04/28/2013  . Seizure disorder ([place]) 01/19/2013  . Cervical disc disorder with radiculopathy of cervical region 10/31/2012  . Solitary pulmonary nodule 08/19/2012  . Anemia 07/05/2012  . 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 Allergic sinusitis 04/18/2012  . Meningioma ([place]) 11/19/2011  . 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 Acute renal failure ([place]) 08/02/2011  . Hemiplegia and hemiparesis following cerebral infarction affecting unspecified side ([place]) 08/02/2011  . Carpal tunnel syndrome of right wrist 05/23/2011  . Polypharmacy 04/28/2011  . Bipolar disorder ([place]) 04/28/2011  . Altered mental status 04/27/2011  . Constipation 04/13/2011  . Falls frequently 12/12/2010  . Oropharyngeal dysphagia 07/12/2010  . Urinary incontinence 12/16/2009  . HEARING LOSS 10/26/2009  . Hyperlipidemia 12/11/2008  . IBS 12/11/2008  . GERD 07/29/2008  . MILK PRODUCTS ALLERGY 07/29/2008  . 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 Backache 06/19/2007  . Osteoporosis 06/19/2007  . Obstructive sleep apnea 06/19/2007  . TRIGGER FINGER 04/18/2007  . DIVERTICULOSIS, COLON 11/13/2006    Past Surgical History:  Procedure Laterality Date  . ABDOMINAL HYSTERECTOMY  1978  . BACK SURGERY  July 2012  . BACTERIAL OVERGROWTH TEST N/A  05/05/2013   Procedure: BACTERIAL OVERGROWTH TEST;  Surgeon: Daneil Dolin, MD;  [facility];  Service: Endoscopy;  Laterality: N/A;  7:30  . BIOPSY THYROID  2009  . BRAIN SURGERY  11/2011   resection of meningioma  . BREAST REDUCTION SURGERY  1994  . CARDIAC CATHETERIZATION  05/10/2005   normal coronaries, normal LV systolic function and EF (Dr. Jackie Plum)  . CARPAL TUNNEL RELEASE Left 07/22/04   Dr. Aline Brochure  . CATARACT EXTRACTION Bilateral   . CHOLECYSTECTOMY  1984  . COLONOSCOPY N/A 09/25/2012   ZSW:FUXNATF diverticulosis.  colonic polyp-removed : tubular adenoma  . CRANIOTOMY  11/23/2011   Procedure: CRANIOTOMY TUMOR EXCISION;  Surgeon: Hosie Spangle, MD;  Location: [facility];  Service: Neurosurgery;  Laterality: N/A;  Craniotomy for tumor resection  . ESOPHAGOGASTRODUODENOSCOPY  12/29/2010   Rourk-Retained food in the esophagus and stomach, small hiatal hernia, status post Maloney dilation of the esophagus  . ESOPHAGOGASTRODUODENOSCOPY N/A 09/25/2012   TDD:UKGURKYH atonic baggy esophagus status post [facility] dilation  64 F. Hiatal hernia  . GIVENS CAPSULE STUDY N/A 01/15/2013   NORMAL.   . IR GENERIC HISTORICAL  03/17/2016   IR RADIOLOGIST EVAL & MGMT 03/17/2016 [facility]-INTERV RAD  . LESION REMOVAL N/A 05/31/2015   Procedure: REMOVAL RIGHT AND LEFT LESIONS OF MANDIBLE;  Surgeon: Diona Browner, DDS;  Location: [facility];  Service: Oral Surgery;  Laterality: N/A;  . MALONEY DILATION  12/29/2010   RMR;  . NM MYOCAR PERF WALL MOTION  2006   "relavtiely normal" persantine, mild anterior thinning (breast attenuation artifact), no region of scar/ischemia  . OVARIAN CYST REMOVAL    . RECTOCELE REPAIR N/A 06/29/2015   Procedure: POSTERIOR REPAIR (RECTOCELE);  Surgeon: Jonnie Kind, MD;  Location: [facility];  Service: Gynecology;  Laterality: N/A;  . REDUCTION MAMMAPLASTY Bilateral   . SPINE SURGERY  09/29/2010   Dr. Rolena Infante  . surgical excision of 3 tumors from right thigh and right buttock   and left upper thigh  2010  . TOOTH EXTRACTION Bilateral 12/14/2014   Procedure: REMOVAL OF BILATERAL MANDIBULAR EXOSTOSES;  Surgeon: Diona Browner, DDS;  Location: [facility];  Service: Oral Surgery;  Laterality: Bilateral;  . TRANSTHORACIC ECHOCARDIOGRAM  2010   EF 60-65%, mild conc LVH, grade 1 diastolic dysfunction; mildly calcified MV annulus with mildly thickened leaflets, mildly calcified MR annulus    OB History    Gravida Para Term Preterm AB Living   6 1 1   5      SAB TAB Ectopic Multiple Live Births   5               Home Medications    Prior to Admission medications   Medication Sig Start Date End Date Taking? Authorizing Provider  albuterol (PROVENTIL HFA;VENTOLIN HFA) 108 (90 Base) MCG/ACT inhaler Inhale 1-2 puffs every 6 hours as needed for wheezing, shortness of breath 07/27/16   Baird Lyons D, MD  aspirin EC 81 MG tablet Take 81 mg by mouth daily.    [provider]  Cholecalciferol (VITAMIN D) 2000 units CAPS Take 1 capsule by mouth daily.    [provider]  cloNIDine (CATAPRES) 0.3 MG tablet TAKE 1 TABLET BY MOUTH EVERY EIGHT HOURS. ONCE AT 8AM, [place], AND 12 MIDNIGHT. 12/01/16   [name], MD  cyclobenzaprine (FLEXERIL) 5 MG tablet Take 1 tablet (5 mg total) by mouth 3 (three) times daily as needed. 12/16/16   Rolland Porter, MD  diclofenac sodium (VOLTAREN) 1 % GEL Apply 2 g topically daily as needed (Pain). 07/21/16   [name], MD  dicyclomine (BENTYL) 10 MG capsule Take 10 mg by mouth 4 (four) times daily -  before meals and at bedtime.    [provider]  docusate sodium (COLACE) 100 MG capsule Take 200 mg by mouth at bedtime.    [provider]  fluticasone (FLONASE) 50 MCG/ACT nasal spray Place 2 sprays into both nostrils daily.    [provider]  hydroxychloroquine (PLAQUENIL) 200 MG tablet Take 200 mg by mouth 2 (two) times daily.     [provider]  insulin aspart (NOVOLOG FLEXPEN) 100 UNIT/ML  FlexPen INJECT 17 TO 24 UNITS INTO THE SKIN 3 (THREE) TIMES DAILY WITH MEALS. 11/06/16   Philemon Kingdom, MD  Insulin Glargine (TOUJEO SOLOSTAR) 300 UNIT/ML SOPN Inject 30 Units into the skin at bedtime. 11/06/16   Philemon Kingdom, MD  lamoTRIgine (LAMICTAL) 100 MG tablet Take 1 tablet (100 mg total) by mouth 2 (two) times daily. 08/30/16  Tomi Likens, Adam R, DO  levofloxacin (LEVAQUIN) 500 MG tablet Take 1 tablet (500 mg total) by mouth daily. 12/16/16   Rolland Porter, MD  levothyroxine (SYNTHROID, LEVOTHROID) 50 MCG tablet TAKE 1 TABLET BY MOUTH ONCE DAILY AND 1/2 TABLET ON SUNDAYS. 12/05/16   Philemon Kingdom, MD  LORazepam (ATIVAN) 0.5 MG tablet Take 0.5 tablets (0.25 mg total) by mouth 3 (three) times daily. 10/20/16   Rexene Alberts, MD  losartan (COZAAR) 50 MG tablet TAKE ONE TABLET BY MOUTH DAILY. 07/04/16   [name], MD  lubiprostone (AMITIZA) 24 MCG capsule Take 1 capsule (24 mcg total) by mouth 2 (two) times daily with a meal. 10/31/16   Esterwood, Amy S, PA-C  metoprolol tartrate (LOPRESSOR) 50 MG tablet Take 1 tablet (50 mg total) by mouth 2 (two) times daily. Please call and schedule appointment 09/26/16   Pixie Casino, MD  montelukast (SINGULAIR) 10 MG tablet TAKE ONE TABLET BY MOUTH ONCE DAILY. 08/30/16   [name], MD  Multiple Vitamins-Minerals (ONE-A-DAY 50 PLUS PO) Take 1 tablet by mouth daily.    [provider]  ondansetron (ZOFRAN ODT) 8 MG disintegrating tablet Take 1 tablet (8 mg total) by mouth every 8 (eight) hours as needed for nausea or vomiting. 10/10/16   Jola Schmidt, MD  predniSONE (DELTASONE) 20 MG tablet Take 3 po QD x 3d , then 2 po QD x 3d then 1 po QD x 3d 12/16/16   Rolland Porter, MD  pregabalin (LYRICA) 75 MG capsule Take 1 capsule (75 mg total) by mouth daily. 10/20/16   Rexene Alberts, MD  RABEprazole (ACIPHEX) 20 MG tablet TAKE 1 TABLET BY MOUTH TWICE DAILY. 12/01/16   Pyrtle, Lajuan Lines, MD  RESTASIS 0.05 % ophthalmic emulsion Place 1 drop into  both eyes 2 (two) times daily.  02/04/14   [provider]  risperiDONE (RISPERDAL) 0.5 MG tablet Take 1 tablet (0.5 mg total) by mouth at bedtime. 09/27/16   Cloria Spring, MD  rosuvastatin (CRESTOR) 5 MG tablet TAKE 1 TABLET BY MOUTH AT BEDTIME. 08/30/16   [name], MD  sertraline (ZOLOFT) 100 MG tablet Take 1 tablet (100 mg total) by mouth daily. 10/20/16   Rexene Alberts, MD  traZODone (DESYREL) 100 MG tablet Take 1 tablet (100 mg total) by mouth at bedtime. 10/20/16   Rexene Alberts, MD    Family History Family History  Problem Relation Age of Onset  . Heart attack Mother        HTN  . Pneumonia Father   . Kidney failure Father   . Diabetes Father   . Pancreatic cancer Sister   . Diabetes Brother   . Hypertension Brother   . Diabetes Brother   . Cancer Sister        breast   . Hypertension Son   . Sleep apnea Son   . Cancer Sister        pancreatic  . Stroke Maternal Grandmother   . Heart attack Maternal Grandfather   . Alcohol abuse Maternal Uncle   . Colon cancer Neg Hx   . Anesthesia problems Neg Hx   . Hypotension Neg Hx   . Malignant hyperthermia Neg Hx   . Pseudochol deficiency Neg Hx     Social History Social History  Substance Use Topics  . Smoking status: Light Tobacco Smoker    Packs/day: 0.25    Years: 7.00    Types: Cigarettes  . Smokeless tobacco: Never Used  Comment: continues to smoke 1/4 pack a day   . Alcohol use No     Comment:    lives at home Lives alone   Allergies   Cephalexin; Iron; Milk-related compounds; Penicillins; and Phenazopyridine hcl   Review of Systems Review of Systems  All other systems reviewed and are negative.    Physical Exam Updated Vital Signs BP (!) 183/63 (BP Location: Left Arm)   Pulse 73   Temp 98.8 F (37.1 C) (Oral)   Resp 18   Ht 5' (1.524 m)   Wt 67.6 kg (149 lb)   SpO2 98%   BMI 29.10 kg/m   Vital signs normal except for hypertension   Physical Exam  Constitutional:  She is oriented to person, place, and time. She appears well-developed and well-nourished.  Non-toxic appearance. She does not appear ill. No distress.  HENT:  Head: Normocephalic and atraumatic.  Right Ear: External ear normal.  Left Ear: External ear normal.  Nose: Nose normal. No mucosal edema or rhinorrhea.  Mouth/Throat: Oropharynx is clear and moist and mucous membranes are normal. No dental abscesses or uvula swelling.  Eyes: Pupils are equal, round, and reactive to light. Conjunctivae and EOM are normal.  Neck: Normal range of motion and full passive range of motion without pain. Neck supple.  Cardiovascular: Normal rate, regular rhythm and normal heart sounds.  Exam reveals no gallop and no friction rub.   No murmur heard. Pulmonary/Chest: Effort normal and breath sounds normal. No respiratory distress. She has no wheezes. She has no rhonchi. She has no rales. She exhibits no tenderness and no crepitus.  Abdominal: Soft. Normal appearance and bowel sounds are normal. She exhibits no distension. There is generalized tenderness. There is no rebound and no guarding.  Patient has mild diffuse abdominal pain. He also has some right CVA tenderness.  Musculoskeletal: Normal range of motion. She exhibits no edema or tenderness.  Moves all extremities well.   Neurological: She is alert and oriented to person, place, and time. She has normal strength. No cranial nerve deficit.  Skin: Skin is warm, dry and intact. No rash noted. No erythema. No pallor.  Psychiatric: She has a normal mood and affect. Her speech is normal and behavior is normal. Her mood appears not anxious.  Nursing note and vitals reviewed.    ED Treatments / Results  Labs (all labs ordered are listed, but only abnormal results are displayed) Results for orders placed or performed during the hospital encounter of 12/16/16  Urinalysis, Routine w reflex microscopic  Result Value Ref Range   Color, Urine YELLOW YELLOW    APPearance HAZY (A) CLEAR   Specific Gravity, Urine 1.024 1.005 - 1.030   pH 5.0 5.0 - 8.0   Glucose, UA NEGATIVE NEGATIVE mg/dL   Hgb urine dipstick NEGATIVE NEGATIVE   Bilirubin Urine NEGATIVE NEGATIVE   Ketones, ur NEGATIVE NEGATIVE mg/dL   Protein, ur 30 (A) NEGATIVE mg/dL   Nitrite NEGATIVE NEGATIVE   Leukocytes, UA TRACE (A) NEGATIVE   RBC / HPF 0-5 0 - 5 RBC/hpf   WBC, UA 6-30 0 - 5 WBC/hpf   Bacteria, UA NONE SEEN NONE SEEN   Squamous Epithelial / LPF 6-30 (A) NONE SEEN   Mucus PRESENT   Comprehensive metabolic panel  Result Value Ref Range   Sodium 145 135 - 145 mmol/L   Potassium 3.5 3.5 - 5.1 mmol/L   Chloride 112 (H) 101 - 111 mmol/L   CO2 24 22 - 32 mmol/L  Glucose, Bld 59 (L) 65 - 99 mg/dL   BUN 18 6 - 20 mg/dL   Creatinine, Ser 1.19 (H) 0.44 - 1.00 mg/dL   Calcium 8.9 8.9 - 10.3 mg/dL   Total Protein 7.5 6.5 - 8.1 g/dL   Albumin 3.7 3.5 - 5.0 g/dL   AST 25 15 - 41 U/L   ALT 24 14 - 54 U/L   Alkaline Phosphatase 121 38 - 126 U/L   Total Bilirubin 0.2 (L) 0.3 - 1.2 mg/dL   GFR calc non Af Amer 47 (L) >60 mL/min   GFR calc Af Amer 54 (L) >60 mL/min   Anion gap 9 5 - 15  Lipase, blood  Result Value Ref Range   Lipase 38 11 - 51 U/L  CBC with Differential  Result Value Ref Range   WBC 7.3 4.0 - 10.5 K/uL   RBC 4.40 3.87 - 5.11 MIL/uL   Hemoglobin 9.8 (L) 12.0 - 15.0 g/dL   HCT 32.7 (L) 36.0 - 46.0 %   MCV 74.3 (L) 78.0 - 100.0 fL   MCH 22.3 (L) 26.0 - 34.0 pg   MCHC 30.0 30.0 - 36.0 g/dL   RDW 14.3 11.5 - 15.5 %   Platelets 252 150 - 400 K/uL   Neutrophils Relative % 46 %   Neutro Abs 3.4 1.7 - 7.7 K/uL   Lymphocytes Relative 42 %   Lymphs Abs 3.0 0.7 - 4.0 K/uL   Monocytes Relative 9 %   Monocytes Absolute 0.6 0.1 - 1.0 K/uL   Eosinophils Relative 3 %   Eosinophils Absolute 0.2 0.0 - 0.7 K/uL   Basophils Relative 0 %   Basophils Absolute 0.0 0.0 - 0.1 K/uL   *Note: Due to a large number of results and/or encounters for the requested time period,  some results have not been displayed. A complete set of results can be found in Results Review.   Laboratory interpretation all normal except stable anemia, renal insufficiency, contaminated UA but possible UTI    EKG  EKG Interpretation None       Radiology Ct Renal Stone Study  Result Date: 12/16/2016 CLINICAL DATA:  Hervey Ard constant RIGHT flank pain radiating to RIGHT groin, dysuria, frequency, suspected kidney stone, history of pancreatitis, diverticulosis, type II diabetes mellitus, COPD, GERD, irritable bowel syndrome, fibromyalgia EXAM: CT ABDOMEN AND PELVIS WITHOUT CONTRAST TECHNIQUE: Multidetector CT imaging of the abdomen and pelvis was performed following the standard protocol without IV contrast. Sagittal and coronal MPR images reconstructed from axial data set. No oral contrast administered. COMPARISON:  10/10/2016 FINDINGS: Lower chest: Peribronchial thickening and minimal atelectasis at lung bases Hepatobiliary: Gallbladder surgically absent.  Liver unremarkable. Pancreas: Normal appearance Spleen: Normal appearance Adrenals/Urinary Tract: Adrenal glands normal appearance. Kidneys, ureters, and bladder normal appearance. No urinary tract calcification or dilatation. Stomach/Bowel: Normal appendix. Scattered stool throughout colon. Scattered colonic diverticula greatest at distal descending and proximal sigmoid colon without evidence of diverticulitis. Stomach and bowel loops otherwise normal appearance. Vascular/Lymphatic: Atherosclerotic calcification aorta without aneurysm. Few coronary arterial calcifications. No adenopathy. Reproductive: Uterus surgically absent. Nonvisualization of ovaries. Other: No free air free fluid.  No hernia. Musculoskeletal: Degenerative disc disease changes at caudal aspect of lumbar spine. No acute bone lesions. IMPRESSION: Post cholecystectomy and hysterectomy with nonvisualization of ovaries. Mild colonic diverticulosis without evidence of  diverticulitis. No acute intra-abdominal or intrapelvic abnormalities. Aortic Atherosclerosis (ICD10-I70.0). Electronically Signed   By: Lavonia Dana M.D.   On: 12/16/2016 02:14    Procedures Procedures (including critical  care time)  Medications Ordered in ED Medications  ciprofloxacin (CIPRO) tablet 500 mg (not administered)  ketorolac (TORADOL) 30 MG/ML injection 15 mg (15 mg Intravenous Given 12/16/16 0126)  ondansetron (ZOFRAN) injection 4 mg (4 mg Intravenous Given 12/16/16 0126)     Initial Impression / Assessment and Plan / ED Course  I have reviewed the triage vital signs and the nursing notes.  Pertinent labs & imaging results that were available during my care of the patient were reviewed by me and considered in my medical decision making (see chart for details).     Patient states she has been on pain medications, hydrocodone 3 times a day and she is concerned about getting addicted. This may account for some of her constipation.  Patient was given Toradol for pain. She was given Zofran for nausea. CT scan renal was done to look for renal stone or pyelonephritis.  Recheck at 3:20 AM patient states her pain is better. She was given her test results. When I reviewed her CT scan she still had a lot of stool in her colon. She will be advised to take MiraLAX. She was given Cipro in the ED for her UTI and discharged home with Levaquin due to her insurance. She was advised to take the prednisone as an anti-inflammatory in the muscle relaxer for her back pain.  Review of the Tina shows patient was on #90 hydrocodone 10/325 monthly through June, 18. She then got #30 tabs of hydrocodone 5/325 on August 20 and #30 of the same on September 19. So it appears her doctors have been tapering her down. She is also getting #30 Lyrica 75 mg monthly, last filled September 28 and #90 lorazepam 0.5 mg last filled September 28.  Final Clinical Impressions(s) / ED Diagnoses   Final  diagnoses:  Right flank pain  Muscle pain  Acute cystitis without hematuria  Constipation, unspecified constipation type    New Prescriptions New Prescriptions   CYCLOBENZAPRINE (FLEXERIL) 5 MG TABLET    Take 1 tablet (5 mg total) by mouth 3 (three) times daily as needed.   LEVOFLOXACIN (LEVAQUIN) 500 MG TABLET    Take 1 tablet (500 mg total) by mouth daily.   PREDNISONE (DELTASONE) 20 MG TABLET    Take 3 po QD x 3d , then 2 po QD x 3d then 1 po QD x 3d  OTC miralax  Plan discharge  Rolland Porter, MD, Barbette Or, MD 12/16/16 [phone]

## 2016-12-16 NOTE — Discharge Instructions (Signed)
Use ice and heat for comfort.  Take the medications as prescribed. Get miralax and put one dose or 17 g in 8 ounces of water,  take 1 dose every 30 minutes for 2-3 hours or until you  get good results and then once or twice daily to prevent constipation. Recheck if you get a fever, have uncontrolled vomiting or pain.

## 2016-12-18 ENCOUNTER — Ambulatory Visit: Payer: Medicare HMO

## 2016-12-18 ENCOUNTER — Encounter (HOSPITAL_COMMUNITY): Payer: Self-pay | Admitting: Psychiatry

## 2016-12-18 ENCOUNTER — Ambulatory Visit (INDEPENDENT_AMBULATORY_CARE_PROVIDER_SITE_OTHER): Payer: Medicare HMO | Admitting: Psychiatry

## 2016-12-18 DIAGNOSIS — I69354 Hemiplegia and hemiparesis following cerebral infarction affecting left non-dominant side: Secondary | ICD-10-CM | POA: Diagnosis not present

## 2016-12-18 DIAGNOSIS — I110 Hypertensive heart disease with heart failure: Secondary | ICD-10-CM | POA: Diagnosis not present

## 2016-12-18 DIAGNOSIS — E1142 Type 2 diabetes mellitus with diabetic polyneuropathy: Secondary | ICD-10-CM | POA: Diagnosis not present

## 2016-12-18 DIAGNOSIS — F331 Major depressive disorder, recurrent, moderate: Secondary | ICD-10-CM

## 2016-12-18 DIAGNOSIS — F250 Schizoaffective disorder, bipolar type: Secondary | ICD-10-CM | POA: Diagnosis not present

## 2016-12-18 DIAGNOSIS — J449 Chronic obstructive pulmonary disease, unspecified: Secondary | ICD-10-CM | POA: Diagnosis not present

## 2016-12-18 DIAGNOSIS — F17210 Nicotine dependence, cigarettes, uncomplicated: Secondary | ICD-10-CM | POA: Diagnosis not present

## 2016-12-18 DIAGNOSIS — I509 Heart failure, unspecified: Secondary | ICD-10-CM | POA: Diagnosis not present

## 2016-12-18 DIAGNOSIS — Z9181 History of falling: Secondary | ICD-10-CM | POA: Diagnosis not present

## 2016-12-18 DIAGNOSIS — G40909 Epilepsy, unspecified, not intractable, without status epilepticus: Secondary | ICD-10-CM | POA: Diagnosis not present

## 2016-12-18 NOTE — Progress Notes (Signed)
Patient:  [name]               DOB: [birth year]          MR Number: [id number]  Location:   [place]:  [address] [place],  [place],    [place]  Start:  Monday 12/18/2016 10:12 AM  End:       Monday 12/18/2016 11:00 AM  Provider/Observer:     Maurice Small, MSW, LCSW   Chief Complaint:                                         Depression  Reason For Service:               [name] is a 66 y.o. female who presents with a long standing history of recurrent periods of depression beginning when she was 87 and her favorite uncle died. She has been experiencing increased symptoms of depression recently due to stress in the relationship with her son. Per patient's report, he rarely visits her anymore due to his involvement with an older woman who is controlling. She also reports frustration regarding her knees who is her power of attorn      ey and is very demanding per      patient's report. She reports issues with other family members and states she needs help dealing with her family Patient reports multiple psychiatric hospitlaizations due to depression and suicidal ideations with thel last one occuring in 1997. Patient has participated in outpatient psychotherapy and medication management intermittently since age 71.  She currently is seeing psychiatrist Dr. Harrington Challenger . Prior to this, she was being seen at [facility]. Patient also has had ECT at [facility].    Interventions Strategy:  Supportive  Participation Level:   Active      Participation Quality:  Appropriate      Behavioral Observation:  Casual, well groomed, drowsy, speech very slow   Current Psychosocial Factors: Tension in relationship with son, multiple health issues, concerns about grandson  Content of Session:   reviewed symptoms, discussed stressors, facilitated expression of thoughts and feelings regarding stressors, praised and reinforced patient's  efforts to respect boundaries in her relationship with  her son, assisted patient identify the effects on her relationship with her son and her grandson, assisted patient identity effects on her mood, e   Current Status:   improved mood, absence of irritability, decreased worry  Suicidal/Homicidal:    No   Patient Progress:   Good. Patient reports decreased anger and worry since last session. She reports improved interaction with her son and grandson. She expresses less worry about them and their relationship. She continues to have multiple health issues and expresses increasing concern about her health. She reports increased poor concentration and memory difficulty. She states being tired of being sick and in pain. She reports going to the ED this past weekend due to pain and urinary issues. She reports she has had hallucinations for several months but has not shared that with anyone as she fears her cousin will try to have her put away. She denies any command hallucinations but reports auditory and visual hallucinations.  She continues to express frustration regarding her GI issues but is working with her doctor regarding these issues. She reports no worry about her connection with church as she realizes it was all her her thoughts  that caused her to feel disconnected. She states today knowing her church members love and support her and cites several examples of how they have been supportive in recent weeks.   Target Goals:   1.  Learn and implement behavioral strategies to overcome depression.    2.  Verbalize an understanding and resolution of current interpersonal problems.   Last Reviewed:   10/13/2016  Goals Addressed Today:    2  Plan:      Return again in 2 - 3 weeks.  Impression/Diagnosis:   Patient presents with long standing history of recurrent periods  of depression beginning when she was [age] and her favorite uncle died. Patient reports multiple psychiatric hospitlaizations due to depression and suicidal ideations with thel last one  occuring in 1997. Patient has participated in outpatient psychotherapy and medication management intermittently since age 24.  She currently is seeing psychiatrist Dr. Harrington Challenger . Prior to this, she was being seen at [facility]. Patient also has had ECT at [facility]. Symptoms have worsened in recent months due to family stress. Current symptoms include depressed mood, anxiety, excessive worry, and tearfulness.   Diagnosis:  Axis I: MDD recurrent episode, moderate          Axis II: Deferred     [name] Vollmer, LCSW 12/18/2016

## 2016-12-19 ENCOUNTER — Ambulatory Visit (HOSPITAL_COMMUNITY): Payer: Medicare HMO | Admitting: Psychiatry

## 2016-12-19 ENCOUNTER — Telehealth: Payer: Self-pay | Admitting: Family Medicine

## 2016-12-19 NOTE — Telephone Encounter (Signed)
Nurse visit to recheck blood pressure so this can be addressed please this pm or tomorrow

## 2016-12-19 NOTE — Telephone Encounter (Signed)
pls call pt and work her in as oV tomorrow for me, let her know I have a msg re her blood pressure eig high, also let  know she had questions about headaches for me when I was gone, pls give her an appt to see me tomorrow  Pls let her know she needs to bring ALL her meds. Pls let her know I am concerned!  Thanks

## 2016-12-19 NOTE — Telephone Encounter (Signed)
Marc from physical therapy called to tell you patients blood pressure is up, 180/80. Altamese Dilling- PT [phone]

## 2016-12-20 DIAGNOSIS — G40909 Epilepsy, unspecified, not intractable, without status epilepticus: Secondary | ICD-10-CM | POA: Diagnosis not present

## 2016-12-20 DIAGNOSIS — F250 Schizoaffective disorder, bipolar type: Secondary | ICD-10-CM | POA: Diagnosis not present

## 2016-12-20 DIAGNOSIS — I69354 Hemiplegia and hemiparesis following cerebral infarction affecting left non-dominant side: Secondary | ICD-10-CM | POA: Diagnosis not present

## 2016-12-20 DIAGNOSIS — E1142 Type 2 diabetes mellitus with diabetic polyneuropathy: Secondary | ICD-10-CM | POA: Diagnosis not present

## 2016-12-20 DIAGNOSIS — I110 Hypertensive heart disease with heart failure: Secondary | ICD-10-CM | POA: Diagnosis not present

## 2016-12-20 DIAGNOSIS — Z9181 History of falling: Secondary | ICD-10-CM | POA: Diagnosis not present

## 2016-12-20 DIAGNOSIS — F17210 Nicotine dependence, cigarettes, uncomplicated: Secondary | ICD-10-CM | POA: Diagnosis not present

## 2016-12-20 DIAGNOSIS — I509 Heart failure, unspecified: Secondary | ICD-10-CM | POA: Diagnosis not present

## 2016-12-20 DIAGNOSIS — J449 Chronic obstructive pulmonary disease, unspecified: Secondary | ICD-10-CM | POA: Diagnosis not present

## 2016-12-21 ENCOUNTER — Ambulatory Visit (HOSPITAL_COMMUNITY): Payer: Medicare HMO | Admitting: Psychiatry

## 2016-12-25 DIAGNOSIS — F17210 Nicotine dependence, cigarettes, uncomplicated: Secondary | ICD-10-CM | POA: Diagnosis not present

## 2016-12-25 DIAGNOSIS — Z9181 History of falling: Secondary | ICD-10-CM | POA: Diagnosis not present

## 2016-12-25 DIAGNOSIS — I509 Heart failure, unspecified: Secondary | ICD-10-CM | POA: Diagnosis not present

## 2016-12-25 DIAGNOSIS — I69354 Hemiplegia and hemiparesis following cerebral infarction affecting left non-dominant side: Secondary | ICD-10-CM | POA: Diagnosis not present

## 2016-12-25 DIAGNOSIS — J449 Chronic obstructive pulmonary disease, unspecified: Secondary | ICD-10-CM | POA: Diagnosis not present

## 2016-12-25 DIAGNOSIS — G40909 Epilepsy, unspecified, not intractable, without status epilepticus: Secondary | ICD-10-CM | POA: Diagnosis not present

## 2016-12-25 DIAGNOSIS — I110 Hypertensive heart disease with heart failure: Secondary | ICD-10-CM | POA: Diagnosis not present

## 2016-12-25 DIAGNOSIS — E1142 Type 2 diabetes mellitus with diabetic polyneuropathy: Secondary | ICD-10-CM | POA: Diagnosis not present

## 2016-12-25 DIAGNOSIS — F250 Schizoaffective disorder, bipolar type: Secondary | ICD-10-CM | POA: Diagnosis not present

## 2016-12-26 ENCOUNTER — Ambulatory Visit: Payer: Medicare HMO

## 2016-12-27 DIAGNOSIS — I69354 Hemiplegia and hemiparesis following cerebral infarction affecting left non-dominant side: Secondary | ICD-10-CM | POA: Diagnosis not present

## 2016-12-27 DIAGNOSIS — J449 Chronic obstructive pulmonary disease, unspecified: Secondary | ICD-10-CM | POA: Diagnosis not present

## 2016-12-27 DIAGNOSIS — F250 Schizoaffective disorder, bipolar type: Secondary | ICD-10-CM | POA: Diagnosis not present

## 2016-12-27 DIAGNOSIS — G40909 Epilepsy, unspecified, not intractable, without status epilepticus: Secondary | ICD-10-CM | POA: Diagnosis not present

## 2016-12-27 DIAGNOSIS — I110 Hypertensive heart disease with heart failure: Secondary | ICD-10-CM | POA: Diagnosis not present

## 2016-12-27 DIAGNOSIS — Z9181 History of falling: Secondary | ICD-10-CM | POA: Diagnosis not present

## 2016-12-27 DIAGNOSIS — F17210 Nicotine dependence, cigarettes, uncomplicated: Secondary | ICD-10-CM | POA: Diagnosis not present

## 2016-12-27 DIAGNOSIS — I509 Heart failure, unspecified: Secondary | ICD-10-CM | POA: Diagnosis not present

## 2016-12-27 DIAGNOSIS — E1142 Type 2 diabetes mellitus with diabetic polyneuropathy: Secondary | ICD-10-CM | POA: Diagnosis not present

## 2016-12-31 DIAGNOSIS — M150 Primary generalized (osteo)arthritis: Secondary | ICD-10-CM | POA: Diagnosis not present

## 2016-12-31 DIAGNOSIS — R32 Unspecified urinary incontinence: Secondary | ICD-10-CM | POA: Diagnosis not present

## 2016-12-31 DIAGNOSIS — I639 Cerebral infarction, unspecified: Secondary | ICD-10-CM | POA: Diagnosis not present

## 2017-01-01 ENCOUNTER — Other Ambulatory Visit: Payer: Self-pay | Admitting: Internal Medicine

## 2017-01-01 DIAGNOSIS — I110 Hypertensive heart disease with heart failure: Secondary | ICD-10-CM | POA: Diagnosis not present

## 2017-01-01 DIAGNOSIS — Z9181 History of falling: Secondary | ICD-10-CM | POA: Diagnosis not present

## 2017-01-01 DIAGNOSIS — G40909 Epilepsy, unspecified, not intractable, without status epilepticus: Secondary | ICD-10-CM | POA: Diagnosis not present

## 2017-01-01 DIAGNOSIS — I509 Heart failure, unspecified: Secondary | ICD-10-CM | POA: Diagnosis not present

## 2017-01-01 DIAGNOSIS — J449 Chronic obstructive pulmonary disease, unspecified: Secondary | ICD-10-CM | POA: Diagnosis not present

## 2017-01-01 DIAGNOSIS — E1142 Type 2 diabetes mellitus with diabetic polyneuropathy: Secondary | ICD-10-CM | POA: Diagnosis not present

## 2017-01-01 DIAGNOSIS — F250 Schizoaffective disorder, bipolar type: Secondary | ICD-10-CM | POA: Diagnosis not present

## 2017-01-01 DIAGNOSIS — F17210 Nicotine dependence, cigarettes, uncomplicated: Secondary | ICD-10-CM | POA: Diagnosis not present

## 2017-01-01 DIAGNOSIS — I69354 Hemiplegia and hemiparesis following cerebral infarction affecting left non-dominant side: Secondary | ICD-10-CM | POA: Diagnosis not present

## 2017-01-01 NOTE — Telephone Encounter (Signed)
Pt to have an appointmet for uncontrolled hypertension noted at multiple visits and also called in by homehealth. She is followed by neurology

## 2017-01-02 DIAGNOSIS — I110 Hypertensive heart disease with heart failure: Secondary | ICD-10-CM | POA: Diagnosis not present

## 2017-01-02 DIAGNOSIS — Z9181 History of falling: Secondary | ICD-10-CM | POA: Diagnosis not present

## 2017-01-02 DIAGNOSIS — I509 Heart failure, unspecified: Secondary | ICD-10-CM | POA: Diagnosis not present

## 2017-01-02 DIAGNOSIS — J449 Chronic obstructive pulmonary disease, unspecified: Secondary | ICD-10-CM | POA: Diagnosis not present

## 2017-01-02 DIAGNOSIS — F17210 Nicotine dependence, cigarettes, uncomplicated: Secondary | ICD-10-CM | POA: Diagnosis not present

## 2017-01-02 DIAGNOSIS — F250 Schizoaffective disorder, bipolar type: Secondary | ICD-10-CM | POA: Diagnosis not present

## 2017-01-02 DIAGNOSIS — G40909 Epilepsy, unspecified, not intractable, without status epilepticus: Secondary | ICD-10-CM | POA: Diagnosis not present

## 2017-01-02 DIAGNOSIS — I69354 Hemiplegia and hemiparesis following cerebral infarction affecting left non-dominant side: Secondary | ICD-10-CM | POA: Diagnosis not present

## 2017-01-02 DIAGNOSIS — E1142 Type 2 diabetes mellitus with diabetic polyneuropathy: Secondary | ICD-10-CM | POA: Diagnosis not present

## 2017-01-04 ENCOUNTER — Ambulatory Visit (HOSPITAL_COMMUNITY): Payer: Medicare HMO | Admitting: Psychiatry

## 2017-01-06 ENCOUNTER — Other Ambulatory Visit (HOSPITAL_COMMUNITY): Payer: Self-pay | Admitting: Psychiatry

## 2017-01-08 ENCOUNTER — Other Ambulatory Visit (HOSPITAL_COMMUNITY): Payer: Self-pay | Admitting: Psychiatry

## 2017-01-08 ENCOUNTER — Ambulatory Visit (INDEPENDENT_AMBULATORY_CARE_PROVIDER_SITE_OTHER): Payer: Medicare HMO | Admitting: Psychiatry

## 2017-01-08 ENCOUNTER — Encounter (HOSPITAL_COMMUNITY): Payer: Self-pay | Admitting: Psychiatry

## 2017-01-08 ENCOUNTER — Other Ambulatory Visit: Payer: Self-pay | Admitting: Internal Medicine

## 2017-01-08 ENCOUNTER — Other Ambulatory Visit: Payer: Self-pay | Admitting: Family Medicine

## 2017-01-08 DIAGNOSIS — F331 Major depressive disorder, recurrent, moderate: Secondary | ICD-10-CM

## 2017-01-08 NOTE — Telephone Encounter (Signed)
REFILL 

## 2017-01-08 NOTE — Progress Notes (Signed)
Patient:  [name]               DOB: [birth year]          MR Number:  [id number]  Location:   [place]:  [address] [place],  [place],    [place]  Start:  Monday 01/08/2017 10:28 AM End:       Monday 01/08/2017 11:15 AM   Provider/Observer:     Maurice Small, MSW, LCSW   Chief Complaint:                                         Depression  Reason For Service:               [name] is a 66 y.o. female who presents with a long standing history of recurrent periods of depression beginning when she was 54 and her favorite uncle died. She has been experiencing increased symptoms of depression recently due to stress in the relationship with her son. Per patient's report, he rarely visits her anymore due to his involvement with an older woman who is controlling. She also reports frustration regarding her knees who is her power of attorn      ey and is very demanding per      patient's report. She reports issues with other family members and states she needs help dealing with her family Patient reports multiple psychiatric hospitlaizations due to depression and suicidal ideations with thel last one occuring in 1997. Patient has participated in outpatient psychotherapy and medication management intermittently since age 80.  She currently is seeing psychiatrist Dr. Harrington Challenger . Prior to this, she was being seen at [facility]. Patient also has had ECT at [facility].    Interventions Strategy:  Supportive  Participation Level:   Active      Participation Quality:  Appropriate      Behavioral Observation:  Casual, well groomed, drowsy, speech very slow   Current Psychosocial Factors: Tension in relationship with son, multiple health issues, concerns about grandson  Content of Session:   reviewed symptoms, discussed stressors, facilitated expression of thoughts and feelings regarding stressors, praised and reinforced patient's  efforts to respect boundaries in her relationship  with her son and others, discussed hallucinations, assisted patient identify the connection between stress and hallucinations discussed ways to cope with hallucinations including distraction and talking with psychiatrist Dr. Harrington Challenger regarding medication concerns, discussed ways to use support system, assisted patient identify realistic expectations of self regarding her relationship with her friend and ways to set and maintain boundaries in the relationship.  Current Status:   improved mood, absence of irritability, decreased worry  Suicidal/Homicidal:    No   Patient Progress:   Good. Patient reports continued decreased anger and worry since last session. She expresses some sadness her son does not call or visit her as often as she would like but reports being okay with being alone. She continues to have contact from church members. She expresses frustration and anxiety regarding continued hallucinations. She denies any command hallucinations. Patient is scheduled to see psychiatrist Dr. Harrington Challenger on 01/24/2017. Patient reports some stress associated with trying to help out a friend who has multiple issues and talks with patient about her problems.  Target Goals:   1.  Learn and implement behavioral strategies to overcome depression.    2.  Verbalize an understanding and  resolution of current interpersonal problems.   Last Reviewed:   10/13/2016  Goals Addressed Today:    2  Plan:      Return again in 2 - 3 weeks.  Impression/Diagnosis:   Patient presents with long standing history of recurrent periods  of depression beginning when she was [age] and her favorite uncle died. Patient reports multiple psychiatric hospitlaizations due to depression and suicidal ideations with thel last one occuring in 1997. Patient has participated in outpatient psychotherapy and medication management intermittently since age 14.  She currently is seeing psychiatrist Dr. Harrington Challenger . Prior to this, she was being seen at [facility].  Patient also has had ECT at [facility]. Symptoms have worsened in recent months due to family stress and have  included depressed mood, anxiety, excessive worry, and tearfulness.   Diagnosis:  Axis I: MDD recurrent episode, moderate          Axis II: Deferred     Shelle Galdamez, LCSW 01/08/2017

## 2017-01-11 ENCOUNTER — Telehealth: Payer: Self-pay | Admitting: *Deleted

## 2017-01-11 DIAGNOSIS — E1142 Type 2 diabetes mellitus with diabetic polyneuropathy: Secondary | ICD-10-CM | POA: Diagnosis not present

## 2017-01-11 DIAGNOSIS — I69354 Hemiplegia and hemiparesis following cerebral infarction affecting left non-dominant side: Secondary | ICD-10-CM | POA: Diagnosis not present

## 2017-01-11 DIAGNOSIS — Z9181 History of falling: Secondary | ICD-10-CM | POA: Diagnosis not present

## 2017-01-11 DIAGNOSIS — F250 Schizoaffective disorder, bipolar type: Secondary | ICD-10-CM | POA: Diagnosis not present

## 2017-01-11 DIAGNOSIS — I110 Hypertensive heart disease with heart failure: Secondary | ICD-10-CM | POA: Diagnosis not present

## 2017-01-11 DIAGNOSIS — G40909 Epilepsy, unspecified, not intractable, without status epilepticus: Secondary | ICD-10-CM | POA: Diagnosis not present

## 2017-01-11 DIAGNOSIS — J449 Chronic obstructive pulmonary disease, unspecified: Secondary | ICD-10-CM | POA: Diagnosis not present

## 2017-01-11 DIAGNOSIS — I509 Heart failure, unspecified: Secondary | ICD-10-CM | POA: Diagnosis not present

## 2017-01-11 DIAGNOSIS — F17210 Nicotine dependence, cigarettes, uncomplicated: Secondary | ICD-10-CM | POA: Diagnosis not present

## 2017-01-11 NOTE — Telephone Encounter (Signed)
Patient called stating the nurse needs to fax the prescription for a mattress.

## 2017-01-15 DIAGNOSIS — F250 Schizoaffective disorder, bipolar type: Secondary | ICD-10-CM | POA: Diagnosis not present

## 2017-01-15 DIAGNOSIS — F17210 Nicotine dependence, cigarettes, uncomplicated: Secondary | ICD-10-CM | POA: Diagnosis not present

## 2017-01-15 DIAGNOSIS — Z9181 History of falling: Secondary | ICD-10-CM | POA: Diagnosis not present

## 2017-01-15 DIAGNOSIS — G40909 Epilepsy, unspecified, not intractable, without status epilepticus: Secondary | ICD-10-CM | POA: Diagnosis not present

## 2017-01-15 DIAGNOSIS — I69354 Hemiplegia and hemiparesis following cerebral infarction affecting left non-dominant side: Secondary | ICD-10-CM | POA: Diagnosis not present

## 2017-01-15 DIAGNOSIS — I110 Hypertensive heart disease with heart failure: Secondary | ICD-10-CM | POA: Diagnosis not present

## 2017-01-15 DIAGNOSIS — E1142 Type 2 diabetes mellitus with diabetic polyneuropathy: Secondary | ICD-10-CM | POA: Diagnosis not present

## 2017-01-15 DIAGNOSIS — J449 Chronic obstructive pulmonary disease, unspecified: Secondary | ICD-10-CM | POA: Diagnosis not present

## 2017-01-15 DIAGNOSIS — I509 Heart failure, unspecified: Secondary | ICD-10-CM | POA: Diagnosis not present

## 2017-01-15 NOTE — Telephone Encounter (Signed)
Patient stated she wants it faxed to " [facility]"

## 2017-01-15 NOTE — Telephone Encounter (Signed)
Can you see where the patient wants this faxed to.

## 2017-01-16 ENCOUNTER — Other Ambulatory Visit: Payer: Self-pay

## 2017-01-16 DIAGNOSIS — I10 Essential (primary) hypertension: Secondary | ICD-10-CM | POA: Diagnosis not present

## 2017-01-16 DIAGNOSIS — J449 Chronic obstructive pulmonary disease, unspecified: Secondary | ICD-10-CM | POA: Diagnosis not present

## 2017-01-18 ENCOUNTER — Ambulatory Visit: Payer: Self-pay | Admitting: Family Medicine

## 2017-01-22 ENCOUNTER — Other Ambulatory Visit (HOSPITAL_COMMUNITY): Payer: Self-pay | Admitting: Psychiatry

## 2017-01-23 ENCOUNTER — Ambulatory Visit (INDEPENDENT_AMBULATORY_CARE_PROVIDER_SITE_OTHER): Payer: Medicare HMO | Admitting: Family Medicine

## 2017-01-23 ENCOUNTER — Encounter: Payer: Self-pay | Admitting: Family Medicine

## 2017-01-23 VITALS — BP 150/68 | HR 80 | Resp 16 | Ht 60.0 in | Wt 150.1 lb

## 2017-01-23 DIAGNOSIS — F17210 Nicotine dependence, cigarettes, uncomplicated: Secondary | ICD-10-CM | POA: Diagnosis not present

## 2017-01-23 DIAGNOSIS — E1169 Type 2 diabetes mellitus with other specified complication: Secondary | ICD-10-CM

## 2017-01-23 DIAGNOSIS — E785 Hyperlipidemia, unspecified: Secondary | ICD-10-CM

## 2017-01-23 DIAGNOSIS — E039 Hypothyroidism, unspecified: Secondary | ICD-10-CM

## 2017-01-23 DIAGNOSIS — Z0000 Encounter for general adult medical examination without abnormal findings: Secondary | ICD-10-CM

## 2017-01-23 DIAGNOSIS — E669 Obesity, unspecified: Secondary | ICD-10-CM

## 2017-01-23 DIAGNOSIS — R0989 Other specified symptoms and signs involving the circulatory and respiratory systems: Secondary | ICD-10-CM

## 2017-01-23 DIAGNOSIS — M79644 Pain in right finger(s): Secondary | ICD-10-CM

## 2017-01-23 DIAGNOSIS — Z1211 Encounter for screening for malignant neoplasm of colon: Secondary | ICD-10-CM | POA: Diagnosis not present

## 2017-01-23 LAB — POC HEMOCCULT BLD/STL (OFFICE/1-CARD/DIAGNOSTIC): FECAL OCCULT BLD: NEGATIVE

## 2017-01-23 MED ORDER — LEVOTHYROXINE SODIUM 50 MCG PO TABS: ORAL_TABLET | ORAL | 2 refills | Status: DC

## 2017-01-23 MED ORDER — CLONIDINE HCL 0.3 MG PO TABS: ORAL_TABLET | ORAL | 2 refills | Status: DC

## 2017-01-23 MED ORDER — LOSARTAN POTASSIUM 50 MG PO TABS: 50.0000 mg | ORAL_TABLET | Freq: Every day | ORAL | 1 refills | Status: DC

## 2017-01-23 MED ORDER — UNABLE TO FIND: 0 refills | Status: DC

## 2017-01-23 NOTE — Patient Instructions (Addendum)
F/u in 4 ,5 months, call if you need me sooner  Need to QUIT smoking to improve you health  We will request matress from [facility]  Fasting lipid, cmp and vit D in next 1 to 2 weeks  Be careful not to fall.  No med changes  Thank you  for choosing [facility]. We consider it a privelige to serve you.  Delivering excellent health care in a caring and  compassionate way is our goal.  Partnering with you,  so that together we can achieve this goal is our strategy.    Fall Prevention in the Home Falls can cause injuries. They can happen to people of all ages. There are many things you can do to make your home safe and to help prevent falls. What can I do on the outside of my home?  Regularly fix the edges of walkways and driveways and fix any cracks.  Remove anything that might make you trip as you walk through a door, such as a raised step or threshold.  Trim any bushes or trees on the path to your home.  Use bright outdoor lighting.  Clear any walking paths of anything that might make someone trip, such as rocks or tools.  Regularly check to see if handrails are loose or broken. Make sure that both sides of any steps have handrails.  Any raised decks and porches should have guardrails on the edges.  Have any leaves, snow, or ice cleared regularly.  Use sand or salt on walking paths during winter.  Clean up any spills in your garage right away. This includes oil or grease spills. What can I do in the bathroom?  Use night lights.  Install grab bars by the toilet and in the tub and shower. Do not use towel bars as grab bars.  Use non-skid mats or decals in the tub or shower.  If you need to sit down in the shower, use a plastic, non-slip stool.  Keep the floor dry. Clean up any water that spills on the floor as soon as it happens.  Remove soap buildup in the tub or shower regularly.  Attach bath mats securely with double-sided non-slip rug tape.  Do not  have throw rugs and other things on the floor that can make you trip. What can I do in the bedroom?  Use night lights.  Make sure that you have a light by your bed that is easy to reach.  Do not use any sheets or blankets that are too big for your bed. They should not hang down onto the floor.  Have a firm chair that has side arms. You can use this for support while you get dressed.  Do not have throw rugs and other things on the floor that can make you trip. What can I do in the kitchen?  Clean up any spills right away.  Avoid walking on wet floors.  Keep items that you use a lot in easy-to-reach places.  If you need to reach something above you, use a strong step stool that has a grab bar.  Keep electrical cords out of the way.  Do not use floor polish or wax that makes floors slippery. If you must use wax, use non-skid floor wax.  Do not have throw rugs and other things on the floor that can make you trip. What can I do with my stairs?  Do not leave any items on the stairs.  Make sure that there are  handrails on both sides of the stairs and use them. Fix handrails that are broken or loose. Make sure that handrails are as long as the stairways.  Check any carpeting to make sure that it is firmly attached to the stairs. Fix any carpet that is loose or worn.  Avoid having throw rugs at the top or bottom of the stairs. If you do have throw rugs, attach them to the floor with carpet tape.  Make sure that you have a light switch at the top of the stairs and the bottom of the stairs. If you do not have them, ask someone to add them for you. What else can I do to help prevent falls?  Wear shoes that: ? Do not have high heels. ? Have rubber bottoms. ? Are comfortable and fit you well. ? Are closed at the toe. Do not wear sandals.  If you use a stepladder: ? Make sure that it is fully opened. Do not climb a closed stepladder. ? Make sure that both sides of the stepladder are  locked into place. ? Ask someone to hold it for you, if possible.  Clearly mark and make sure that you can see: ? Any grab bars or handrails. ? First and last steps. ? Where the edge of each step is.  Use tools that help you move around (mobility aids) if they are needed. These include: ? Canes. ? Walkers. ? Scooters. ? Crutches.  Turn on the lights when you go into a dark area. Replace any light bulbs as soon as they burn out.  Set up your furniture so you have a clear path. Avoid moving your furniture around.  If any of your floors are uneven, fix them.  If there are any pets around you, be aware of where they are.  Review your medicines with your doctor. Some medicines can make you feel dizzy. This can increase your chance of falling. Ask your doctor what other things that you can do to help prevent falls. This information is not intended to replace advice given to you by your health care provider. Make sure you discuss any questions you have with your health care provider. Document Released: 12/24/2008 Document Revised: 08/05/2015 Document Reviewed: 04/03/2014 Elsevier Interactive Patient Education  [employer].

## 2017-01-23 NOTE — Progress Notes (Signed)
[name]     MRN: [id number]      DOB: 03/21/1950  HPI: Patient is in for annual physical exam. C/o right finger pain with swelling, no trauma, improving, but still persist and associated with limitation in movement of the finger. Requests new matress for her hospital bed, current is reportedly over 66 years old and provides little support and padding and is worn out Recent labs,  are reviewed. Immunization is reviewed ,and is up to date PE: BP (!) 150/68   Pulse 80   Resp 16   Ht 5' (1.524 m)   Wt 150 lb 1.9 oz (68.1 kg)   SpO2 95%   BMI 29.32 kg/m   Pleasant  female, alert and oriented x 3, in no cardio-pulmonary distress. Afebrile. HEENT No facial trauma or asymetry. Sinuses non tender.  Extra occullar muscles intact External ears normal, tympanic membranes clear. Oropharynx moist, no exudate. Neck: supple, no adenopathy,JVD or thyromegaly.No bruits.  Chest: Clear to ascultation bilaterally.No crackles or wheezes. Non tender to palpation Decreased though adequate air entry  Breast: No asymetry,no masses or lumps. No tenderness. No nipple discharge or inversion. No axillary or supraclavicular adenopathy  Cardiovascular system; Heart sounds normal,  S1 and  S2 ,no S3.  No murmur, or thrill. Apical beat not displaced Peripheral pulses normal.  Abdomen: Soft, non tender, no organomegaly or masses. No bruits. Bowel sounds normal. No guarding, tenderness or rebound.  Rectal:  Normal sphincter tone. No rectal mass. Guaiac negative stool.  GU: Not examined  Musculoskeletal exam: Decreased  ROM of spine, hips , shoulders and knees.Swlling and tenderness of right index finger, sliNo deformity ,swelling or crepitus noted. muscle wasting noted in lower extrremities   Neurologic: Cranial nerves 2 to 12 intact. Power and  tone decreased in lower extremities Abnormal  gait. An resting  Tremor noted.  Skin: Intact, no ulceration, erythema , scaling or rash  noted. Pigmentation normal throughout  Psych; Normal mood and affect. Judgement and concentration normal   Assessment & Plan:  Annual physical exam Annual exam as documented. Counseling done  re healthy lifestyle involving commitment to 150 minutes exercise per week, heart healthy diet, and attaining healthy weight.The importance of adequate sleep also discussed. Regular seat belt use and home safety, is also discussed. Changes in health habits are decided on by the patient with goals and time frames  set for achieving them. Immunization and cancer screening needs are specifically addressed at this visit.   Light cigarette smoker (1-9 cigarettes per day) Patient is asked and  confirms current  Nicotine use.  Five to seven minutes of time is spent in counseling the patient of the need to quit smoking  Advice to quit is delivered clearly specifically in reducing the risk of developing heart disease, having a stroke, or of developing all types of cancer, especially lung and oral cancer. Improvement in breathing and exercise tolerance and quality of life is also discussed, as is the economic benefit.  Assessment of willingness to quit or to make an attempt to quit is made and documented  Assistance in quit attempt is made with several and varied options presented, based on patient's desire and need. These include  literature, local classes available, 1800 QUIT NOW number, OTC and prescription medication.  The GOAL to be NICOTINE FREE is re emphasized.  The patient has set a personal goal of either reduction or discontinuation and follow up is arranged between 6 an 16 weeks.  Finger pain, right Acute onset of pain and swelling limiting movement mildly. 5 day course of low dose prednisone prescribed, if incomplete or unsatisfactory response , she will be referred to ortho, does have mild triigger

## 2017-01-24 ENCOUNTER — Telehealth: Payer: Self-pay | Admitting: Family Medicine

## 2017-01-24 ENCOUNTER — Telehealth: Payer: Self-pay | Admitting: *Deleted

## 2017-01-24 ENCOUNTER — Ambulatory Visit (HOSPITAL_COMMUNITY): Payer: Medicare HMO | Admitting: Psychiatry

## 2017-01-24 ENCOUNTER — Other Ambulatory Visit: Payer: Self-pay | Admitting: Family Medicine

## 2017-01-24 ENCOUNTER — Encounter (HOSPITAL_COMMUNITY): Payer: Self-pay | Admitting: Psychiatry

## 2017-01-24 ENCOUNTER — Ambulatory Visit (INDEPENDENT_AMBULATORY_CARE_PROVIDER_SITE_OTHER): Payer: Medicare HMO | Admitting: Psychiatry

## 2017-01-24 VITALS — BP 148/70 | HR 73 | Ht 60.0 in | Wt 152.0 lb

## 2017-01-24 DIAGNOSIS — R5383 Other fatigue: Secondary | ICD-10-CM

## 2017-01-24 DIAGNOSIS — R42 Dizziness and giddiness: Secondary | ICD-10-CM | POA: Diagnosis not present

## 2017-01-24 DIAGNOSIS — F331 Major depressive disorder, recurrent, moderate: Secondary | ICD-10-CM | POA: Diagnosis not present

## 2017-01-24 DIAGNOSIS — F17210 Nicotine dependence, cigarettes, uncomplicated: Secondary | ICD-10-CM | POA: Diagnosis not present

## 2017-01-24 DIAGNOSIS — R5381 Other malaise: Secondary | ICD-10-CM | POA: Diagnosis not present

## 2017-01-24 DIAGNOSIS — Z811 Family history of alcohol abuse and dependence: Secondary | ICD-10-CM

## 2017-01-24 DIAGNOSIS — R413 Other amnesia: Secondary | ICD-10-CM

## 2017-01-24 DIAGNOSIS — M549 Dorsalgia, unspecified: Secondary | ICD-10-CM | POA: Diagnosis not present

## 2017-01-24 DIAGNOSIS — M2550 Pain in unspecified joint: Secondary | ICD-10-CM

## 2017-01-24 DIAGNOSIS — Z736 Limitation of activities due to disability: Secondary | ICD-10-CM

## 2017-01-24 MED ORDER — TRAZODONE HCL 100 MG PO TABS: 100.0000 mg | ORAL_TABLET | Freq: Every day | ORAL | 2 refills | Status: DC

## 2017-01-24 MED ORDER — LORAZEPAM 0.5 MG PO TABS: 0.2500 mg | ORAL_TABLET | Freq: Three times a day (TID) | ORAL | 2 refills | Status: DC

## 2017-01-24 MED ORDER — PREDNISONE 5 MG PO TABS: 5.0000 mg | ORAL_TABLET | Freq: Two times a day (BID) | ORAL | 0 refills | Status: AC

## 2017-01-24 MED ORDER — RISPERIDONE 0.5 MG PO TABS: 0.5000 mg | ORAL_TABLET | Freq: Every day | ORAL | 2 refills | Status: DC

## 2017-01-24 MED ORDER — SERTRALINE HCL 100 MG PO TABS: 100.0000 mg | ORAL_TABLET | Freq: Every day | ORAL | 2 refills | Status: DC

## 2017-01-24 NOTE — Telephone Encounter (Signed)
Patient aware and will call back after treatment if referral needed
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
## 2017-01-24 NOTE — Telephone Encounter (Signed)
faxed

## 2017-01-24 NOTE — Telephone Encounter (Signed)
Five day course of prednisone prescribed, one twice daily, because of poor kidney function cannot get nsAID, I recommend ortho for triggering, pls let me know who she wants to be referred to I will enter

## 2017-01-24 NOTE — Telephone Encounter (Signed)
Add to last msg:  Patient says she hit her forehead and hurt her neck during fall

## 2017-01-24 NOTE — Telephone Encounter (Signed)
See below message   She said she didn't get anything for her swollen finger at her visit yesterday. It was entered in the complaint

## 2017-01-24 NOTE — Telephone Encounter (Signed)
Patient called left message stating she got her mattress but she needs a new bed. Please advise

## 2017-01-24 NOTE — Telephone Encounter (Signed)
Patient came into office, says she fell 3 times this morning, her blood sugar dropped to 36, and she wants to know what she should do about her finger (wont bend, hard to write or open anything, has been swollen)  Cb# [phone]

## 2017-01-26 DIAGNOSIS — F250 Schizoaffective disorder, bipolar type: Secondary | ICD-10-CM | POA: Diagnosis not present

## 2017-01-26 DIAGNOSIS — G40909 Epilepsy, unspecified, not intractable, without status epilepticus: Secondary | ICD-10-CM | POA: Diagnosis not present

## 2017-01-26 DIAGNOSIS — E1142 Type 2 diabetes mellitus with diabetic polyneuropathy: Secondary | ICD-10-CM | POA: Diagnosis not present

## 2017-01-26 DIAGNOSIS — I69354 Hemiplegia and hemiparesis following cerebral infarction affecting left non-dominant side: Secondary | ICD-10-CM | POA: Diagnosis not present

## 2017-01-26 DIAGNOSIS — I509 Heart failure, unspecified: Secondary | ICD-10-CM | POA: Diagnosis not present

## 2017-01-26 DIAGNOSIS — F17210 Nicotine dependence, cigarettes, uncomplicated: Secondary | ICD-10-CM | POA: Diagnosis not present

## 2017-01-26 DIAGNOSIS — J449 Chronic obstructive pulmonary disease, unspecified: Secondary | ICD-10-CM | POA: Diagnosis not present

## 2017-01-26 DIAGNOSIS — I110 Hypertensive heart disease with heart failure: Secondary | ICD-10-CM | POA: Diagnosis not present

## 2017-01-26 DIAGNOSIS — Z9181 History of falling: Secondary | ICD-10-CM | POA: Diagnosis not present

## 2017-01-28 ENCOUNTER — Encounter: Payer: Self-pay | Admitting: Family Medicine

## 2017-01-28 DIAGNOSIS — M79644 Pain in right finger(s): Secondary | ICD-10-CM | POA: Insufficient documentation

## 2017-01-28 NOTE — Assessment & Plan Note (Signed)
Annual exam as documented. Counseling done  re healthy lifestyle involving commitment to 150 minutes exercise per week, heart healthy diet, and attaining healthy weight.The importance of adequate sleep also discussed. Regular seat belt use and home safety, is also discussed. Changes in health habits are decided on by the patient with goals and time frames  set for achieving them. Immunization and cancer screening needs are specifically addressed at this visit.  

## 2017-01-28 NOTE — Assessment & Plan Note (Addendum)
Acute onset of pain and swelling limiting movement mildly. 5 day course of low dose prednisone prescribed, if incomplete or unsatisfactory response , she will be referred to ortho, does have mild triigger

## 2017-01-28 NOTE — Assessment & Plan Note (Signed)
Patient is asked and  confirms current  Nicotine use.  Five to seven minutes of time is spent in counseling the patient of the need to quit smoking  Advice to quit is delivered clearly specifically in reducing the risk of developing heart disease, having a stroke, or of developing all types of cancer, especially lung and oral cancer. Improvement in breathing and exercise tolerance and quality of life is also discussed, as is the economic benefit.  Assessment of willingness to quit or to make an attempt to quit is made and documented  Assistance in quit attempt is made with several and varied options presented, based on patient's desire and need. These include  literature, local classes available, 1800 QUIT NOW number, OTC and prescription medication.  The GOAL to be NICOTINE FREE is re emphasized.  The patient has set a personal goal of either reduction or discontinuation and follow up is arranged between 6 an 16 weeks.   

## 2017-01-29 DIAGNOSIS — I110 Hypertensive heart disease with heart failure: Secondary | ICD-10-CM | POA: Diagnosis not present

## 2017-01-29 DIAGNOSIS — Z9181 History of falling: Secondary | ICD-10-CM | POA: Diagnosis not present

## 2017-01-29 DIAGNOSIS — G40909 Epilepsy, unspecified, not intractable, without status epilepticus: Secondary | ICD-10-CM | POA: Diagnosis not present

## 2017-01-29 DIAGNOSIS — F250 Schizoaffective disorder, bipolar type: Secondary | ICD-10-CM | POA: Diagnosis not present

## 2017-01-29 DIAGNOSIS — F17210 Nicotine dependence, cigarettes, uncomplicated: Secondary | ICD-10-CM | POA: Diagnosis not present

## 2017-01-29 DIAGNOSIS — I509 Heart failure, unspecified: Secondary | ICD-10-CM | POA: Diagnosis not present

## 2017-01-29 DIAGNOSIS — E1142 Type 2 diabetes mellitus with diabetic polyneuropathy: Secondary | ICD-10-CM | POA: Diagnosis not present

## 2017-01-29 DIAGNOSIS — J449 Chronic obstructive pulmonary disease, unspecified: Secondary | ICD-10-CM | POA: Diagnosis not present

## 2017-01-29 DIAGNOSIS — I69354 Hemiplegia and hemiparesis following cerebral infarction affecting left non-dominant side: Secondary | ICD-10-CM | POA: Diagnosis not present

## 2017-01-30 DIAGNOSIS — G4733 Obstructive sleep apnea (adult) (pediatric): Secondary | ICD-10-CM | POA: Diagnosis not present

## 2017-01-31 ENCOUNTER — Encounter: Payer: Self-pay | Admitting: Podiatry

## 2017-01-31 ENCOUNTER — Ambulatory Visit (INDEPENDENT_AMBULATORY_CARE_PROVIDER_SITE_OTHER): Payer: Medicare HMO | Admitting: Podiatry

## 2017-01-31 DIAGNOSIS — B351 Tinea unguium: Secondary | ICD-10-CM

## 2017-01-31 DIAGNOSIS — I639 Cerebral infarction, unspecified: Secondary | ICD-10-CM | POA: Diagnosis not present

## 2017-01-31 DIAGNOSIS — Z9181 History of falling: Secondary | ICD-10-CM | POA: Diagnosis not present

## 2017-01-31 DIAGNOSIS — I110 Hypertensive heart disease with heart failure: Secondary | ICD-10-CM | POA: Diagnosis not present

## 2017-01-31 DIAGNOSIS — M150 Primary generalized (osteo)arthritis: Secondary | ICD-10-CM | POA: Diagnosis not present

## 2017-01-31 DIAGNOSIS — M79676 Pain in unspecified toe(s): Secondary | ICD-10-CM

## 2017-01-31 DIAGNOSIS — G40909 Epilepsy, unspecified, not intractable, without status epilepticus: Secondary | ICD-10-CM | POA: Diagnosis not present

## 2017-01-31 DIAGNOSIS — Q828 Other specified congenital malformations of skin: Secondary | ICD-10-CM

## 2017-01-31 DIAGNOSIS — F250 Schizoaffective disorder, bipolar type: Secondary | ICD-10-CM | POA: Diagnosis not present

## 2017-01-31 DIAGNOSIS — R32 Unspecified urinary incontinence: Secondary | ICD-10-CM | POA: Diagnosis not present

## 2017-01-31 DIAGNOSIS — E1142 Type 2 diabetes mellitus with diabetic polyneuropathy: Secondary | ICD-10-CM | POA: Diagnosis not present

## 2017-01-31 DIAGNOSIS — F17210 Nicotine dependence, cigarettes, uncomplicated: Secondary | ICD-10-CM | POA: Diagnosis not present

## 2017-01-31 DIAGNOSIS — I69354 Hemiplegia and hemiparesis following cerebral infarction affecting left non-dominant side: Secondary | ICD-10-CM | POA: Diagnosis not present

## 2017-01-31 DIAGNOSIS — Z794 Long term (current) use of insulin: Secondary | ICD-10-CM | POA: Diagnosis not present

## 2017-01-31 DIAGNOSIS — J449 Chronic obstructive pulmonary disease, unspecified: Secondary | ICD-10-CM | POA: Diagnosis not present

## 2017-01-31 DIAGNOSIS — I509 Heart failure, unspecified: Secondary | ICD-10-CM | POA: Diagnosis not present

## 2017-01-31 NOTE — Progress Notes (Signed)
Complaint:  Visit Type: Patient returns to my office for continued preventative foot care services. Complaint: Patient states" my nails have grown long and thick and become painful to walk and wear shoes" Patient has been diagnosed with DM with neuropathy. The patient presents for preventative foot care services. No changes to ROS  Podiatric Exam: Vascular: dorsalis pedis and posterior tibial pulses are palpable bilateral. Capillary return is immediate. Temperature gradient is WNL. Skin turgor WNL  Sensorium: Diminished  Semmes Weinstein monofilament test. Normal tactile sensation bilaterally. Nail Exam: Pt has thick disfigured discolored nails with subungual debris noted bilateral entire nail hallux through fifth toenails Ulcer Exam: There is no evidence of ulcer or pre-ulcerative changes or infection. Orthopedic Exam: Muscle tone and strength are WNL. No limitations in general ROM. No crepitus or effusions noted. HAV B/L.  Hammer toe second left. Skin:  Porokeratosis right foot.. No infection or ulcers  Diagnosis:  Onychomycosis, , Pain in right toe, pain in left toes.  DPN  HAV  B/L  Treatment & Plan Procedures and Treatment: Consent by patient was obtained for treatment procedures. The patient understood the discussion of treatment and procedures well. All questions were answered thoroughly reviewed. Debridement of mycotic and hypertrophic toenails, 1 through 5 bilateral and clearing of subungual debris. No ulceration, no infection noted.  Diabetic shoes are not fitting well.Debride porokeratosis. Return Visit-Office Procedure: Patient instructed to return to the office for a follow up visit 3 months for continued evaluation and treatment.    Gardiner Barefoot DPM

## 2017-02-05 ENCOUNTER — Other Ambulatory Visit: Payer: Self-pay | Admitting: Family Medicine

## 2017-02-05 ENCOUNTER — Ambulatory Visit (INDEPENDENT_AMBULATORY_CARE_PROVIDER_SITE_OTHER): Payer: Medicare HMO | Admitting: Psychiatry

## 2017-02-05 DIAGNOSIS — E1142 Type 2 diabetes mellitus with diabetic polyneuropathy: Secondary | ICD-10-CM | POA: Diagnosis not present

## 2017-02-05 DIAGNOSIS — I69354 Hemiplegia and hemiparesis following cerebral infarction affecting left non-dominant side: Secondary | ICD-10-CM | POA: Diagnosis not present

## 2017-02-05 DIAGNOSIS — G40909 Epilepsy, unspecified, not intractable, without status epilepticus: Secondary | ICD-10-CM | POA: Diagnosis not present

## 2017-02-05 DIAGNOSIS — E1169 Type 2 diabetes mellitus with other specified complication: Secondary | ICD-10-CM | POA: Diagnosis not present

## 2017-02-05 DIAGNOSIS — J449 Chronic obstructive pulmonary disease, unspecified: Secondary | ICD-10-CM | POA: Diagnosis not present

## 2017-02-05 DIAGNOSIS — E669 Obesity, unspecified: Secondary | ICD-10-CM | POA: Diagnosis not present

## 2017-02-05 DIAGNOSIS — Z9181 History of falling: Secondary | ICD-10-CM | POA: Diagnosis not present

## 2017-02-05 DIAGNOSIS — I110 Hypertensive heart disease with heart failure: Secondary | ICD-10-CM | POA: Diagnosis not present

## 2017-02-05 DIAGNOSIS — I509 Heart failure, unspecified: Secondary | ICD-10-CM | POA: Diagnosis not present

## 2017-02-05 DIAGNOSIS — E785 Hyperlipidemia, unspecified: Secondary | ICD-10-CM | POA: Diagnosis not present

## 2017-02-05 DIAGNOSIS — R0989 Other specified symptoms and signs involving the circulatory and respiratory systems: Secondary | ICD-10-CM | POA: Diagnosis not present

## 2017-02-05 DIAGNOSIS — F250 Schizoaffective disorder, bipolar type: Secondary | ICD-10-CM | POA: Diagnosis not present

## 2017-02-05 DIAGNOSIS — F331 Major depressive disorder, recurrent, moderate: Secondary | ICD-10-CM | POA: Diagnosis not present

## 2017-02-05 DIAGNOSIS — F17210 Nicotine dependence, cigarettes, uncomplicated: Secondary | ICD-10-CM | POA: Diagnosis not present
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
## 2017-02-06 LAB — COMPREHENSIVE METABOLIC PANEL
A/G RATIO: 1.7 (ref 1.2–2.2)
ALK PHOS: 113 IU/L (ref 39–117)
ALT: 24 IU/L (ref 0–32)
AST: 26 IU/L (ref 0–40)
Albumin: 4.4 g/dL (ref 3.6–4.8)
BUN/Creatinine Ratio: 13 (ref 12–28)
BUN: 11 mg/dL (ref 8–27)
Bilirubin Total: 0.3 mg/dL (ref 0.0–1.2)
CALCIUM: 9.8 mg/dL (ref 8.7–10.3)
CO2: 23 mmol/L (ref 20–29)
CREATININE: 0.82 mg/dL (ref 0.57–1.00)
Chloride: 106 mmol/L (ref 96–106)
GFR calc Af Amer: 86 mL/min/{1.73_m2} (ref >59)
GFR, EST NON AFRICAN AMERICAN: 75 mL/min/{1.73_m2} (ref >59)
Globulin, Total: 2.6 g/dL (ref 1.5–4.5)
Glucose: 137 mg/dL — ABNORMAL HIGH (ref 65–99)
POTASSIUM: 3.9 mmol/L (ref 3.5–5.2)
Sodium: 144 mmol/L (ref 134–144)
Total Protein: 7 g/dL (ref 6.0–8.5)

## 2017-02-06 LAB — LIPID PANEL W/O CHOL/HDL RATIO
CHOLESTEROL TOTAL: 175 mg/dL (ref 100–199)
HDL: 67 mg/dL (ref >39)
LDL Calculated: 87 mg/dL (ref 0–99)
TRIGLYCERIDES: 105 mg/dL (ref 0–149)
VLDL Cholesterol Cal: 21 mg/dL (ref 5–40)

## 2017-02-06 LAB — HGB A1C W/O EAG: HEMOGLOBIN A1C: 6 % — AB (ref 4.8–5.6)

## 2017-02-06 LAB — SPECIMEN STATUS REPORT

## 2017-02-06 NOTE — Telephone Encounter (Signed)
Just ordered a mattress for her bed. Will discuss at her next visit

## 2017-02-08 ENCOUNTER — Ambulatory Visit (INDEPENDENT_AMBULATORY_CARE_PROVIDER_SITE_OTHER): Payer: Medicare HMO | Admitting: Internal Medicine

## 2017-02-08 ENCOUNTER — Telehealth (HOSPITAL_COMMUNITY): Payer: Self-pay | Admitting: *Deleted

## 2017-02-08 ENCOUNTER — Other Ambulatory Visit: Payer: Self-pay | Admitting: Internal Medicine

## 2017-02-08 ENCOUNTER — Other Ambulatory Visit (HOSPITAL_COMMUNITY): Payer: Self-pay | Admitting: Psychiatry

## 2017-02-08 ENCOUNTER — Other Ambulatory Visit: Payer: Self-pay | Admitting: Neurology

## 2017-02-08 ENCOUNTER — Encounter: Payer: Self-pay | Admitting: Internal Medicine

## 2017-02-08 ENCOUNTER — Other Ambulatory Visit: Payer: Self-pay | Admitting: Family Medicine

## 2017-02-08 VITALS — BP 160/78 | HR 80 | Wt 151.4 lb

## 2017-02-08 DIAGNOSIS — E1169 Type 2 diabetes mellitus with other specified complication: Secondary | ICD-10-CM | POA: Diagnosis not present

## 2017-02-08 DIAGNOSIS — E669 Obesity, unspecified: Secondary | ICD-10-CM | POA: Diagnosis not present

## 2017-02-08 DIAGNOSIS — E042 Nontoxic multinodular goiter: Secondary | ICD-10-CM

## 2017-02-08 DIAGNOSIS — E039 Hypothyroidism, unspecified: Secondary | ICD-10-CM | POA: Diagnosis not present

## 2017-02-08 MED ORDER — LORAZEPAM 0.5 MG PO TABS: 0.5000 mg | ORAL_TABLET | Freq: Three times a day (TID) | ORAL | 2 refills | Status: DC

## 2017-02-08 NOTE — Telephone Encounter (Signed)
Higher dose sent in.

## 2017-02-08 NOTE — Progress Notes (Signed)
Patient ID: [name], female   DOB: 1950/12/04, 66 y.o.   MRN: [id number]  HPI: [name] is a 66 y.o.-year-old female, initially referred by her PCP, [name], for management of DM2, dx 1995, insulin-dependent since 1997, uncontrolled, with complications (gastroparesis, cerebrovasc. Ds-  H/o stroke, PN),  Hypothyroidism, thyroid nodules.  Last visit 3 mo ago.  She stopped taking all insulin for a period of time for 2-3 weeks after the hospitalization ("I just stopped"). Now back on the previous regimen.  Since last visit >> was on Prednisone.  DM2: Last hemoglobin A1c was: Lab Results  Component Value Date   HGBA1C 6.0 (H) 02/05/2017   HGBA1C 6.3 (H) 10/15/2016   HGBA1C 6.6 07/07/2016  10/28/2013: HbA1c 8.5%  She is now on: - Toujeo 25 >> 30 units at bedtime - Novolog:  17 units with a smaller meal  24 units with a larger meal >> only uses this when eating out Could not tolerate Metformin >> nausea.   Pt checks her sugars 4 times a day - brings a great log: - am: 44, 58, 74-140, 172, 212 >> 86-158 >> 77, 86-169, 318 - 2h after b'fast: n/c >> 110 - before lunch: 70, 82-149, 155 >>> 90-206, 253 (Prednisone) >> 81-160, 254, 481 - 2h after lunch: n/c >> 127-154, 229 >> n/c - before dinner: 86-160, 190 but 200s on Prednisone >> 98--245  (high at Tx-giving) - 2h after dinner: n/c - bedtime:  121-190, 386 >> 86-180, 250s on Prednisone >> 68, 89-190, 217, 252 - nighttime: n/c Lowest sugar was 86 >> 77; she has hypoglycemia awareness at 70.  Highest sugar was 250s (Prednisone) >> 482 (Txgiving)  Pt's meals are: - Breakfast: eggs, cereals, fruit, oatmeal, bacon - Lunch: sandwich, beef, mashed potatoes,diet soda - Dinner: chicken, beef, fish, potatoes, vegetables - Snacks: 1-2: fruit  -+ Mild CKD, last BUN/creatinine:  Lab Results  Component Value Date   BUN 11 02/05/2017   CREATININE 0.82 02/05/2017  On losartan -+ HL; last set of lipids: Lab Results  Component  Value Date   CHOL 175 02/05/2017   HDL 67 02/05/2017   LDLCALC 87 02/05/2017   TRIG 105 02/05/2017   CHOLHDL 3.1 07/19/2015  On Crestor and ezetimibe. - last eye exam was in 09/2016: No DR. Had cataract sx B. Dr. Hassell Done [facility]). - + Numbness and tingling in her leg L leg (affected by stroke) but also in R.  Sees podiatry.  Hypothyroidism: - dx >10 years ago. - On Levoxyl 50 mcg 6/7 days and 25 1/7 days: - in am - fasting - at least 30 min from b'fast - no Ca, Fe - not on Biotin - + Multivitamins at noon and Aciphex at night  Latest TSH levels normal: Lab Results  Component Value Date   TSH 0.827 10/15/2016   TSH 0.906 01/22/2016   TSH 1.030 11/03/2015   TSH 1.207 02/21/2015   TSH 1.352 11/09/2014   She has a h/o thyroid nodules.  She has dysphagia with certain foods.  Has had previous esophageal dilations.  Pt denies: - feeling nodules in neck - hoarseness - choking - SOB with lying down  reviewed thyroid U/S (10/2013):  Right thyroid lobe: 3.7 x 1.0 x 1.8 cm   Left thyroid lobe: 3.5 x 1.5 x 1.6 cm   Isthmus: 0.5 cm   Focal nodules: There is a 0.3 mm calcified nodule in the right midzone. There is a 0.6 x 0.7 x 0.5 cm hypoechoic  nodule in the  lateral aspect of the right midzone. There is a 0.7 x 0.6 x 0.5 cm hyperechoic nodule in the lateral aspect of the right lower pole.  There is a 2.2 x 1.1 x 1.7 cm complex solid nodule in the inferior aspect of the isthmus extending adjacent to the left lower pole.  There is a 1.4 x 2.1 x 1.4 cm complex solid nodule in the left lower pole.   Lymphadenopathy: None visualized.  Repeat U/S (01/2014): stable appearance of nodules  FNA (2014) x2: benign  Repeat U/S (10/2015): stable appearance of nodules  She had a L rib fracture in 12/2014.  Patient was admitted with AMS + sepsis on 10/15/2016. She had aspiration pneumonia and acute respiratory failure. She was on Prednisone after d/c for 3 weeks.  ROS: Constitutional:  no weight gain/no weight loss, + fatigue, no subjective hyperthermia, + subjective hypothermia Eyes: + blurry vision, no xerophthalmia ENT: no sore throat, + see HPI Cardiovascular: no CP/no SOB/no palpitations/no leg swelling Respiratory: no cough/no SOB/no wheezing Gastrointestinal: no N/no V/no D/no C/no acid reflux Musculoskeletal: no muscle aches/no joint aches Skin: no rashes, no hair loss Neurological: no tremors/no numbness/no tingling/no dizziness, + HA  I reviewed pt's medications, allergies, PMH, social hx, family hx, and changes were documented in the history of present illness. Otherwise, unchanged from my initial visit note.  Past Medical History:  Diagnosis Date  . Anemia   . Anxiety    takes Ativan daily  . Arthritis   . Bipolar disorder ([place])    takes Risperdal nightly  . Blood transfusion   . Cancer ([place])    In her gum  . Carpal tunnel syndrome of right wrist 05/23/2011  . Cervical disc disorder with radiculopathy of cervical region 10/31/2012  . Chronic back pain   . Chronic idiopathic constipation   . Colon polyps   . COPD (chronic obstructive pulmonary disease) with chronic bronchitis ([place]) 09/16/2013   Office Spirometry 10/30/2013-submaximal effort based on appearance of loop and curve. Numbers would fit with severe restriction but her physiologic capability may be better than this. FVC 0.91/44%, and 10.74/45%, FEV1/FVC 0.81, FEF 25-75% 1.43/69%    . Depression    takes Zoloft daily  . Diabetes mellitus    Type II  . Diverticulosis    TCS 9/08 by Dr. Delfin Edis for diarrhea . Bx for micro scopic colitis negative.   . Fibromyalgia   . Frequent falls   . GERD (gastroesophageal reflux disease)    takes Aciphex daily  . Glaucoma    eye drops daily  . Gum symptoms    infection on antibiotic  . Hemiplegia affecting non-dominant side, post-stroke 08/02/2011  . Hyperlipidemia    takes Crestor daily  . Hypertension    takes Amlodipine,Metoprolol,and Clonidine  daily  . Hypothyroidism    takes Synthroid daily  . IBS (irritable bowel syndrome)   . Insomnia    takes Trazodone nightly  . Metabolic encephalopathy 0/35/0093  . Migraines    chronic headaches  . Mononeuritis lower limb   . Osteoporosis   . Pancreatitis 2006   due to Depakote with normal EUS   . Schatzki's ring    non critical / EGD with ED 8/2011with RMR  . Seizures ([place])    takes Lamictal daily.Last seizure 3 yrs ago  . Sleep apnea    on CPAP  . Stroke [facility])    left sided weakness  . Tubular adenoma of colon    Past  Surgical History:  Procedure Laterality Date  . ABDOMINAL HYSTERECTOMY  1978  . BACK SURGERY  July 2012  . BACTERIAL OVERGROWTH TEST N/A 05/05/2013   Procedure: BACTERIAL OVERGROWTH TEST;  Surgeon: Daneil Dolin, MD;  [facility];  Service: Endoscopy;  Laterality: N/A;  7:30  . BIOPSY THYROID  2009  . BRAIN SURGERY  11/2011   resection of meningioma  . BREAST REDUCTION SURGERY  1994  . CARDIAC CATHETERIZATION  05/10/2005   normal coronaries, normal LV systolic function and EF (Dr. Jackie Plum)  . CARPAL TUNNEL RELEASE Left 07/22/04   Dr. Aline Brochure  . CATARACT EXTRACTION Bilateral   . CHOLECYSTECTOMY  1984  . COLONOSCOPY N/A 09/25/2012   [id number] diverticulosis.  colonic polyp-removed : tubular adenoma  . CRANIOTOMY  11/23/2011   Procedure: CRANIOTOMY TUMOR EXCISION;  Surgeon: Hosie Spangle, MD;  Location: [place] NEURO ORS;  Service: Neurosurgery;  Laterality: N/A;  Craniotomy for tumor resection  . ESOPHAGOGASTRODUODENOSCOPY  12/29/2010   Rourk-Retained food in the esophagus and stomach, small hiatal hernia, status post Maloney dilation of the esophagus  . ESOPHAGOGASTRODUODENOSCOPY N/A 09/25/2012   EXH:[id number] atonic baggy esophagus status post Maloney dilation 76 F. Hiatal hernia  . GIVENS CAPSULE STUDY N/A 01/15/2013   NORMAL.   . IR GENERIC HISTORICAL  03/17/2016   IR RADIOLOGIST EVAL & MGMT 03/17/2016 [facility]-INTERV RAD  . LESION REMOVAL N/A  05/31/2015   Procedure: REMOVAL RIGHT AND LEFT LESIONS OF MANDIBLE;  Surgeon: Diona Browner, DDS;  Location: [place];  Service: Oral Surgery;  Laterality: N/A;  . MALONEY DILATION  12/29/2010   RMR;  . NM MYOCAR PERF WALL MOTION  2006   "relavtiely normal" persantine, mild anterior thinning (breast attenuation artifact), no region of scar/ischemia  . OVARIAN CYST REMOVAL    . RECTOCELE REPAIR N/A 06/29/2015   Procedure: POSTERIOR REPAIR (RECTOCELE);  Surgeon: Jonnie Kind, MD;  Location: [facility];  Service: Gynecology;  Laterality: N/A;  . REDUCTION MAMMAPLASTY Bilateral   . SPINE SURGERY  09/29/2010   Dr. Rolena Infante  . surgical excision of 3 tumors from right thigh and right buttock  and left upper thigh  2010  . TOOTH EXTRACTION Bilateral 12/14/2014   Procedure: REMOVAL OF BILATERAL MANDIBULAR EXOSTOSES;  Surgeon: Diona Browner, DDS;  Location: [place];  Service: Oral Surgery;  Laterality: Bilateral;  . TRANSTHORACIC ECHOCARDIOGRAM  2010   EF 60-65%, mild conc LVH, grade 1 diastolic dysfunction; mildly calcified MV annulus with mildly thickened leaflets, mildly calcified MR annulus   History   Social History  . Marital Status: Divorced    Spouse Name: N/A    Number of Children: 1  . Years of Education: 12   Occupational History  . disabled     Social History Main Topics  . Smoking status: Current Every Day Smoker -- 0.25 packs/day for 7 years    Types: Cigarettes  . Smokeless tobacco: Never Used     Comment: "started back but off now for 3 months" (08/18/13)  . Alcohol Use: No     Comment:    . Drug Use: No   Current Outpatient Medications on File Prior to Visit  Medication Sig Dispense Refill  . albuterol (PROVENTIL HFA;VENTOLIN HFA) 108 (90 Base) MCG/ACT inhaler Inhale 1-2 puffs every 6 hours as needed for wheezing, shortness of breath 3 Inhaler 3  . aspirin EC 81 MG tablet Take 81 mg by mouth daily.    . Cholecalciferol (VITAMIN D) 2000 units CAPS Take 1  capsule by mouth daily.    .  cloNIDine (CATAPRES) 0.3 MG tablet TAKE 1 TABLET BY MOUTH EVERY EIGHT HOURS. ONCE AT 8AM, 4PM, AND 12 MIDNIGHT. 270 tablet 2  . cyclobenzaprine (FLEXERIL) 5 MG tablet Take 1 tablet (5 mg total) by mouth 3 (three) times daily as needed. 30 tablet 0  . diclofenac sodium (VOLTAREN) 1 % GEL Apply 2 g topically daily as needed (Pain). 100 g 2  . dicyclomine (BENTYL) 10 MG capsule Take 10 mg by mouth 4 (four) times daily -  before meals and at bedtime.    . docusate sodium (COLACE) 100 MG capsule Take 200 mg by mouth at bedtime.    . fluticasone (FLONASE) 50 MCG/ACT nasal spray Place 2 sprays into both nostrils daily.    . hydroxychloroquine (PLAQUENIL) 200 MG tablet Take 200 mg by mouth 2 (two) times daily.     . insulin aspart (NOVOLOG FLEXPEN) 100 UNIT/ML FlexPen INJECT 17 TO 24 UNITS INTO THE SKIN 3 (THREE) TIMES DAILY WITH MEALS. 45 mL 3  . Insulin Glargine (TOUJEO SOLOSTAR) 300 UNIT/ML SOPN Inject 30 Units into the skin at bedtime. 6 pen 3  . levothyroxine (SYNTHROID, LEVOTHROID) 50 MCG tablet TAKE 1 TABLET BY MOUTH ONCE DAILY AND 1/2 TABLET ON SUNDAYS. 30 tablet 2  . losartan (COZAAR) 50 MG tablet Take 1 tablet (50 mg total) daily by mouth. 90 tablet 1  . montelukast (SINGULAIR) 10 MG tablet TAKE ONE TABLET BY MOUTH ONCE DAILY. 90 tablet 0  . Multiple Vitamins-Minerals (ONE-A-DAY 50 PLUS PO) Take 1 tablet by mouth daily.    . pregabalin (LYRICA) 75 MG capsule Take 1 capsule (75 mg total) by mouth daily.    . RABEprazole (ACIPHEX) 20 MG tablet TAKE 1 TABLET BY MOUTH TWICE DAILY. 180 tablet 0  . RESTASIS 0.05 % ophthalmic emulsion Place 1 drop into both eyes 2 (two) times daily.     . risperiDONE (RISPERDAL) 0.5 MG tablet Take 1 tablet (0.5 mg total) at bedtime by mouth. 90 tablet 2  . rosuvastatin (CRESTOR) 5 MG tablet TAKE 1 TABLET BY MOUTH AT BEDTIME. 90 tablet 0  . sertraline (ZOLOFT) 100 MG tablet Take 1 tablet (100 mg total) daily by mouth. 30 tablet 2  . traZODone (DESYREL) 100 MG tablet  Take 1 tablet (100 mg total) at bedtime by mouth. 30 tablet 2  . UNABLE TO [facility] bed mattress x 1  DX: G47.33, J44.9 1 each 0   Current Facility-Administered Medications on File Prior to Visit  Medication Dose Route Frequency Provider Last Rate Last Dose  . acetaminophen (TYLENOL) tablet 325 mg  325 mg Oral Once [name], MD          Allergies  Allergen Reactions  . Cephalexin Hives  . Iron Nausea And Vomiting  . Milk-Related Compounds Other (See Comments)    Doesn't agree with stomach.   . Penicillins Hives       . Phenazopyridine Hcl Hives         Family History  Problem Relation Age of Onset  . Heart attack Mother        HTN  . Pneumonia Father   . Kidney failure Father   . Diabetes Father   . Pancreatic cancer Sister   . Diabetes Brother   . Hypertension Brother   . Diabetes Brother   . Cancer Sister        breast   . Hypertension Son   . Sleep apnea Son   .  Cancer Sister        pancreatic  . Stroke Maternal Grandmother   . Heart attack Maternal Grandfather   . Alcohol abuse Maternal Uncle   . Colon cancer Neg Hx   . Anesthesia problems Neg Hx   . Hypotension Neg Hx   . Malignant hyperthermia Neg Hx   . Pseudochol deficiency Neg Hx    PE: BP (!) 160/78   Pulse 80   Wt 151 lb 6.4 oz (68.7 kg)   SpO2 97%   BMI 29.57 kg/m  Body mass index is 29.57 kg/m.  Wt Readings from Last 3 Encounters:  02/08/17 151 lb 6.4 oz (68.7 kg)  01/23/17 150 lb 1.9 oz (68.1 kg)  12/16/16 149 lb (67.6 kg)   Constitutional: Normal weight, in NAD Eyes: PERRLA, EOMI, no exophthalmos ENT: moist mucous membranes, no thyromegaly, + enlarged nodule felt in isthmus, no cervical lymphadenopathy Cardiovascular: RRR, No RG, + 1/6 SEM Respiratory: CTA B Gastrointestinal: abdomen soft, NT, ND, BS+ Musculoskeletal: no deformities, strength intact in all 4 Skin: moist, warm, no rashes Neurological: no tremor with outstretched hands, DTR normal in all  4   ASSESSMENT: 1. DM2, insulin-dependent, uncontrolled, without complications - gastroparesis - cerebrovasc. Ds -  H/o stroke - PN  She was interested in an insulin pump >> discussed about [employer] (given brochure) at a previous visit.  2. Hypothyroidism  3. MNG - Thyroid U/S (10/11/2012):  Right thyroid lobe: 3.7 x 1.0 x 1.8 cm  Left thyroid lobe: 3.5 x 1.5 x 1.6 cm  Isthmus: 0.5 cm  Focal nodules: There is a 0.3 mm calcified nodule in the right midzone. There is a 0.6 x 0.7 x 0.5 cm hypoechoic nodule in the lateral aspect of the right midzone. There is a 0.7 x 0.6 x 0.5 cm hyperechoic nodule in the lateral aspect of the right lower pole. There is a 2.2 x 1.1 x 1.7 cm complex solid nodule in the inferior aspect of the isthmus extending adjacent to the left lower pole. There is a 1.4 x 2.1 x 1.4 cm complex solid nodule in the left lower pole.  Lymphadenopathy: None visualized.  IMPRESSION: Multi nodular goiter which has markedly progressed since the prior exam. The dominant nodule at the inferior aspect of the isthmus fits criteria for fine needle aspiration biopsy if not previously Assessed.  - FNA (11/05/2012) x2: benign  - Thyroid U/S (01/20/2014) - felt one nodule enlarging >> new U/S: stable appearance of the nodules, except a small new nodule in isthmus: 1.2 cm   Right thyroid lobe Measurements: 4.4 cm x 1.2 cm x 1.7 cm. Multiple right-sided nodules identified. Each right-sided nodule demonstrates increased echogenicity, with the superior measuring 7 mm -8 mm, most inferior measuring 9 mm - 10 mm. A small, 3 mm focus of calcium with posterior shadowing is evident. There is also a mid nodule measuring 7 mm, which is echogenic.  Left thyroid lobe Measurements: 5.1 cm x 2.1 cm x 2.3 cm. Dominant lesion at the inferior pole of left thyroid is again evident, which has been previously biopsied (10/29/2012). This nodule measures 1.8 cm x 1.7cm x 2.5 cm. (Previous 1.4  cm x 2.1 cm x 1.4 cm)  Isthmus Thickness: 4 mm-5 mm. Isthmic nodule again noted, previously biopsied (10/29/2012). Currently this nodule measures 2.2 cm x 1.4 cm x 1.8 cm. (previous measurement 2.2 cm x 1.1 cm x 1.7 cm). There is a new nodule identified within the isthmus with heterogeneously hyperechoic characteristics. This nodule  measures 12 mm x 5.4 mm x 7.2 mm.  Lymphadenopathy: None visualized.  - Thyroid U/S (11/09/2015): Details   Reading Physician Reading Date Result Priority  Sandi Mariscal, MD 11/09/2015   Narrative    CLINICAL DATA: Follow-up thyroid nodules.  EXAM: THYROID ULTRASOUND  TECHNIQUE: Ultrasound examination of the thyroid gland and adjacent soft tissues was performed.  COMPARISON: Thyroid ultrasound - 01/20/2014 ; 10/11/2012  FINDINGS: Parenchymal Echotexture: Moderately heterogenous  Estimated total number of nodules > 1 cm: <5  Number of spongiform nodules > 2 cm not described below (TR1): 0  Number of mixed cystic nodules > 1.5 cm not described below ([place]): 0  _________________________________________________________  Isthmus: 0.7 cm  Nodule # 1:  Prior biopsy: No  Location: Isthmus; left  Size: 1.5 x 1.3 x 1.5 cm, previously 2.2 x 1.4 x 1.8 cm  Composition: solid/almost completely solid (2)  Echogenicity: hyperechoic (1)  Shape: not taller-than-wide (0)  Margins: ill-defined (0)  Echogenic foci: none (0)  ACR TI-RADS total points: 3.  ACR TI-RADS risk category: TR3 (3 points).  Change in features: Yes. Increased internal cystic degeneration and smaller in size.  Change in ACR TI-RADS risk category: No  ACR TI-RADS recommendations:  *Given size (1.5 - 2.4 cm) and appearance, a follow-up ultrasound in 1 year is recommended based on TI-RADS criteria as clinically indicated.  _________________________________________________________  Right lobe: 4.6 x 1.1 x 1.9 cm, previously 4.4 x 1.2 x 1.7 cm  Stable scattered  subcentimeter echogenic nodules  _________________________________________________________  Left lobe: 4.8 x 2.1 x 1.9 cm, previously 5.1 x 2.1 x 2.3 cm  Nodule # 1:  Prior biopsy: No  Location: Left; Inferior  Size: 2.4 x 1.7 x 1.9 cm, previously 2.5 x 1.7 x 1.7  Composition: solid/almost completely solid (2)  Echogenicity: hyperechoic (1)  Shape: not taller-than-wide (0)  Margins: ill-defined (0)  Echogenic foci: none (0)  ACR TI-RADS total points: 3.  ACR TI-RADS risk category: TR3 (3 points).  Change in features: No  Change in ACR TI-RADS risk category: No  ACR TI-RADS recommendations:  *Given size (1.5 - 2.4 cm) and appearance, a follow-up ultrasound in 1 year is recommended based on TI-RADS criteria as clinically indicated.  IMPRESSION: TR 3 isthmic and left thyroid nodules unchanged. *Given size (1.5 - 2.4 cm) and appearance of both thyroid nodules, a follow-up ultrasound in 1 year is recommended based on TI-RADS criteria as clinically indicated.  The above is in keeping with the ACR TI-RADS recommendations - J Am Coll Radiol 2017;14:587-595.   Electronically Signed By: Sandi Mariscal M.D. On: 11/09/2015 19:10       None of the nodules have enlarged or show concerning features. Since they have been biopsied before, will continue following them clinically and by ultrasound.  PLAN:  1. Patient with long-standing, uncontrolled diabetes, on basal-bolus insulin regimen, with good control, except, her sugars are higher at last visit after prednisone but they started to improve towards the time of the visit.  We did not change her insulin regimen at that time. - sugars are fluctuating, but the majority of them are at goal She does have hyperglycemic spikes, especially around the time she had steroids and around Thanksgiving. - At this visit, we reviewed together her previous HbA1c and this was perfect, at 6% 3 days ago - no change in regimen needed - I  advised her to:  Patient Instructions  Please continue: - Toujeo 30 units at bedtime - Novolog:  17 units with a  smaller meal  24 units with a larger meal or with lunch  Please return in 4 months with your sugar log.   - continue checking sugars at different times of the day - check 1x a day, rotating checks - advised for yearly eye exams >> she is UTD - Return to clinic in 3 mo with sugar log   2. Hypothyroidism - latest thyroid labs reviewed with pt >> normal in 10/2016 - she continues on LT4 50 mcg 6 out of 7 days and 25 mcg 1 out of 7 days - pt feels good on this dose. - we discussed about taking the thyroid hormone every day, with water, >30 minutes before breakfast, separated by >4 hours from acid reflux medications, calcium, iron, multivitamins. Pt. is taking it correctly  3. MNG - She previously had 2 normal biopsies (2014). - Reviewed the reports of her imaging tests: The ultrasound from 2014, 15, 17  >>  stable nodules - She has a history of mild dysphagia, but no external esophageal compression per barium swallow from 08/2014.  At that time, she did see some esophageal dysmotility and she is s/p multiple esophageal dilations - No other neck compression symptoms - Continue to watch expectantly  Philemon Kingdom, MD PhD [facility] Endocrinology

## 2017-02-08 NOTE — Patient Instructions (Signed)
Please continue: - Toujeo 30 units at bedtime - Novolog:  17 units with a smaller meal  24 units with a larger meal or with lunch  Please return in 4 months with your sugar log.

## 2017-02-08 NOTE — Telephone Encounter (Signed)
Patient notified that Per Dr Harrington Challenger: Higher dose sent in

## 2017-02-08 NOTE — Telephone Encounter (Signed)
Dr Harrington Challenger patient called in stating that since her Ativan has been decreased from 0.5mg  to  0.25mg  3 times daily she   has been having the following problems: not sleeping well, skin crawling, anxious, not feeling relaxed, jittery. She's requesting to go back on the full dose of the 0.5mg .

## 2017-02-09 ENCOUNTER — Ambulatory Visit: Payer: Medicare HMO | Admitting: Neurology

## 2017-02-10 DIAGNOSIS — I10 Essential (primary) hypertension: Secondary | ICD-10-CM | POA: Diagnosis not present

## 2017-02-10 DIAGNOSIS — J449 Chronic obstructive pulmonary disease, unspecified: Secondary | ICD-10-CM | POA: Diagnosis not present

## 2017-02-13 DIAGNOSIS — Z9181 History of falling: Secondary | ICD-10-CM | POA: Diagnosis not present

## 2017-02-13 DIAGNOSIS — J449 Chronic obstructive pulmonary disease, unspecified: Secondary | ICD-10-CM | POA: Diagnosis not present

## 2017-02-13 DIAGNOSIS — I509 Heart failure, unspecified: Secondary | ICD-10-CM | POA: Diagnosis not present

## 2017-02-13 DIAGNOSIS — F17210 Nicotine dependence, cigarettes, uncomplicated: Secondary | ICD-10-CM | POA: Diagnosis not present

## 2017-02-13 DIAGNOSIS — G40909 Epilepsy, unspecified, not intractable, without status epilepticus: Secondary | ICD-10-CM | POA: Diagnosis not present

## 2017-02-13 DIAGNOSIS — I69354 Hemiplegia and hemiparesis following cerebral infarction affecting left non-dominant side: Secondary | ICD-10-CM | POA: Diagnosis not present

## 2017-02-13 DIAGNOSIS — E1142 Type 2 diabetes mellitus with diabetic polyneuropathy: Secondary | ICD-10-CM | POA: Diagnosis not present

## 2017-02-13 DIAGNOSIS — I110 Hypertensive heart disease with heart failure: Secondary | ICD-10-CM | POA: Diagnosis not present

## 2017-02-13 DIAGNOSIS — F250 Schizoaffective disorder, bipolar type: Secondary | ICD-10-CM | POA: Diagnosis not present

## 2017-02-15 ENCOUNTER — Other Ambulatory Visit: Payer: Medicare HMO | Admitting: Orthotics

## 2017-02-15 DIAGNOSIS — M961 Postlaminectomy syndrome, not elsewhere classified: Secondary | ICD-10-CM | POA: Diagnosis not present

## 2017-02-15 DIAGNOSIS — M5030 Other cervical disc degeneration, unspecified cervical region: Secondary | ICD-10-CM | POA: Diagnosis not present

## 2017-02-15 DIAGNOSIS — G894 Chronic pain syndrome: Secondary | ICD-10-CM | POA: Diagnosis not present

## 2017-02-15 DIAGNOSIS — M5441 Lumbago with sciatica, right side: Secondary | ICD-10-CM | POA: Diagnosis not present

## 2017-02-19 ENCOUNTER — Other Ambulatory Visit: Payer: Medicare HMO | Admitting: Orthotics

## 2017-02-21 DIAGNOSIS — F250 Schizoaffective disorder, bipolar type: Secondary | ICD-10-CM | POA: Diagnosis not present

## 2017-02-21 DIAGNOSIS — E1142 Type 2 diabetes mellitus with diabetic polyneuropathy: Secondary | ICD-10-CM | POA: Diagnosis not present

## 2017-02-21 DIAGNOSIS — I509 Heart failure, unspecified: Secondary | ICD-10-CM | POA: Diagnosis not present

## 2017-02-21 DIAGNOSIS — G40909 Epilepsy, unspecified, not intractable, without status epilepticus: Secondary | ICD-10-CM | POA: Diagnosis not present

## 2017-02-21 DIAGNOSIS — Z9181 History of falling: Secondary | ICD-10-CM | POA: Diagnosis not present

## 2017-02-21 DIAGNOSIS — I110 Hypertensive heart disease with heart failure: Secondary | ICD-10-CM | POA: Diagnosis not present

## 2017-02-21 DIAGNOSIS — I69354 Hemiplegia and hemiparesis following cerebral infarction affecting left non-dominant side: Secondary | ICD-10-CM | POA: Diagnosis not present

## 2017-02-21 DIAGNOSIS — F17210 Nicotine dependence, cigarettes, uncomplicated: Secondary | ICD-10-CM | POA: Diagnosis not present

## 2017-02-21 DIAGNOSIS — J449 Chronic obstructive pulmonary disease, unspecified: Secondary | ICD-10-CM | POA: Diagnosis not present

## 2017-02-23 ENCOUNTER — Other Ambulatory Visit (HOSPITAL_COMMUNITY): Payer: Self-pay | Admitting: Psychiatry

## 2017-02-23 ENCOUNTER — Other Ambulatory Visit: Payer: Medicare HMO | Admitting: Orthotics

## 2017-02-26 ENCOUNTER — Ambulatory Visit (HOSPITAL_COMMUNITY): Payer: Self-pay | Admitting: Psychiatry

## 2017-02-27 DIAGNOSIS — G40909 Epilepsy, unspecified, not intractable, without status epilepticus: Secondary | ICD-10-CM | POA: Diagnosis not present

## 2017-02-27 DIAGNOSIS — Z9181 History of falling: Secondary | ICD-10-CM | POA: Diagnosis not present

## 2017-02-27 DIAGNOSIS — F17210 Nicotine dependence, cigarettes, uncomplicated: Secondary | ICD-10-CM | POA: Diagnosis not present

## 2017-02-27 DIAGNOSIS — I69354 Hemiplegia and hemiparesis following cerebral infarction affecting left non-dominant side: Secondary | ICD-10-CM | POA: Diagnosis not present

## 2017-02-27 DIAGNOSIS — F250 Schizoaffective disorder, bipolar type: Secondary | ICD-10-CM | POA: Diagnosis not present

## 2017-02-27 DIAGNOSIS — I509 Heart failure, unspecified: Secondary | ICD-10-CM | POA: Diagnosis not present

## 2017-02-27 DIAGNOSIS — J449 Chronic obstructive pulmonary disease, unspecified: Secondary | ICD-10-CM | POA: Diagnosis not present

## 2017-02-27 DIAGNOSIS — I110 Hypertensive heart disease with heart failure: Secondary | ICD-10-CM | POA: Diagnosis not present

## 2017-02-27 DIAGNOSIS — E1142 Type 2 diabetes mellitus with diabetic polyneuropathy: Secondary | ICD-10-CM | POA: Diagnosis not present

## 2017-02-28 ENCOUNTER — Ambulatory Visit (INDEPENDENT_AMBULATORY_CARE_PROVIDER_SITE_OTHER): Payer: Medicare HMO | Admitting: Orthotics

## 2017-02-28 DIAGNOSIS — M2011 Hallux valgus (acquired), right foot: Secondary | ICD-10-CM

## 2017-02-28 DIAGNOSIS — M2012 Hallux valgus (acquired), left foot: Secondary | ICD-10-CM | POA: Diagnosis not present

## 2017-02-28 DIAGNOSIS — E1142 Type 2 diabetes mellitus with diabetic polyneuropathy: Secondary | ICD-10-CM

## 2017-02-28 NOTE — Progress Notes (Signed)

## 2017-03-01 ENCOUNTER — Other Ambulatory Visit: Payer: Self-pay | Admitting: Family Medicine

## 2017-03-01 ENCOUNTER — Telehealth: Payer: Self-pay | Admitting: Family Medicine

## 2017-03-01 MED ORDER — NICOTINE 14 MG/24HR TD PT24: 14.0000 mg | MEDICATED_PATCH | Freq: Every day | TRANSDERMAL | 0 refills | Status: DC

## 2017-03-01 NOTE — Telephone Encounter (Signed)
Pt called back in and wants to note that she is requesting Patches to stop smoking, she would like to stop smoking before the New Year

## 2017-03-01 NOTE — Telephone Encounter (Signed)
nicoderm 14 mcg patches sent in for 4 weeks she can use thjose and must not smoke when she has them on!pls let her know

## 2017-03-01 NOTE — Telephone Encounter (Signed)
Pt called and wants Velna Hatchet to call her regarding her insurance.

## 2017-03-02 DIAGNOSIS — E119 Type 2 diabetes mellitus without complications: Secondary | ICD-10-CM | POA: Diagnosis not present

## 2017-03-02 DIAGNOSIS — M150 Primary generalized (osteo)arthritis: Secondary | ICD-10-CM | POA: Diagnosis not present

## 2017-03-02 DIAGNOSIS — I639 Cerebral infarction, unspecified: Secondary | ICD-10-CM | POA: Diagnosis not present

## 2017-03-02 DIAGNOSIS — R32 Unspecified urinary incontinence: Secondary | ICD-10-CM | POA: Diagnosis not present

## 2017-03-05 ENCOUNTER — Telehealth: Payer: Self-pay | Admitting: Family Medicine

## 2017-03-05 ENCOUNTER — Other Ambulatory Visit: Payer: Self-pay | Admitting: Family Medicine

## 2017-03-05 DIAGNOSIS — R040 Epistaxis: Secondary | ICD-10-CM

## 2017-03-05 NOTE — Telephone Encounter (Signed)
Spoke to [employer], states she has had no further nose bleeds since sat. Aware to go to ed if it happens again and wont stop. Would like a referral to ENT in network

## 2017-03-05 NOTE — Progress Notes (Signed)
Pt requests referral to NEW provider in network who she has not seen  In the past for epistaxis , which she reports as severe thois past weekend

## 2017-03-05 NOTE — Telephone Encounter (Signed)
Pt called and said last night she had a time with a nose bleed &  (Saturday night)  Left Nostril pour blood (blood clots) never had it so bad. Right nostril drainage blood and mucus. She needs a referral to see a Dr due to insurance. The ENT that she saw prior said he didn't know what was going on with the patient.  [phone]

## 2017-03-07 ENCOUNTER — Telehealth: Payer: Self-pay | Admitting: Family Medicine

## 2017-03-07 NOTE — Telephone Encounter (Signed)
Referral has been entered.

## 2017-03-07 NOTE — Telephone Encounter (Signed)
Dr Cresenciano Lick office called and they do not do nose bleeds, pt will need to be referred somewhere else

## 2017-03-09 ENCOUNTER — Other Ambulatory Visit: Payer: Self-pay | Admitting: Internal Medicine

## 2017-03-09 ENCOUNTER — Ambulatory Visit (HOSPITAL_COMMUNITY): Payer: Medicare HMO | Admitting: Psychiatry

## 2017-03-09 ENCOUNTER — Telehealth: Payer: Self-pay | Admitting: Family Medicine

## 2017-03-09 NOTE — Telephone Encounter (Signed)
Med sent.

## 2017-03-09 NOTE — Telephone Encounter (Signed)
Patient has not been seen since July of 2018 while hospitalized, and seen on consult at that time. Can refill x 1 but will need appointment for ongoing assessment and EKG please

## 2017-03-09 NOTE — Telephone Encounter (Signed)
[name] wants you to call her - she has some questions,

## 2017-03-09 NOTE — Telephone Encounter (Signed)
Ok to fill for 30 days and refill, but needs appt to see me - has not been seen since 2016.  Dr. Lemmie Evens

## 2017-03-09 NOTE — Telephone Encounter (Signed)
Please fax [employer] her RX [phone]

## 2017-03-09 NOTE — Telephone Encounter (Signed)
Med list faxed in

## 2017-03-13 DIAGNOSIS — G43009 Migraine without aura, not intractable, without status migrainosus: Secondary | ICD-10-CM | POA: Diagnosis not present

## 2017-03-13 DIAGNOSIS — J449 Chronic obstructive pulmonary disease, unspecified: Secondary | ICD-10-CM | POA: Diagnosis not present

## 2017-03-13 DIAGNOSIS — I10 Essential (primary) hypertension: Secondary | ICD-10-CM | POA: Diagnosis not present

## 2017-03-14 DIAGNOSIS — J449 Chronic obstructive pulmonary disease, unspecified: Secondary | ICD-10-CM | POA: Diagnosis not present

## 2017-03-14 DIAGNOSIS — Z9181 History of falling: Secondary | ICD-10-CM | POA: Diagnosis not present

## 2017-03-14 DIAGNOSIS — G40909 Epilepsy, unspecified, not intractable, without status epilepticus: Secondary | ICD-10-CM | POA: Diagnosis not present

## 2017-03-14 DIAGNOSIS — I69354 Hemiplegia and hemiparesis following cerebral infarction affecting left non-dominant side: Secondary | ICD-10-CM | POA: Diagnosis not present

## 2017-03-14 DIAGNOSIS — F250 Schizoaffective disorder, bipolar type: Secondary | ICD-10-CM | POA: Diagnosis not present

## 2017-03-14 DIAGNOSIS — I110 Hypertensive heart disease with heart failure: Secondary | ICD-10-CM | POA: Diagnosis not present

## 2017-03-14 DIAGNOSIS — E1142 Type 2 diabetes mellitus with diabetic polyneuropathy: Secondary | ICD-10-CM | POA: Diagnosis not present

## 2017-03-14 DIAGNOSIS — F17210 Nicotine dependence, cigarettes, uncomplicated: Secondary | ICD-10-CM | POA: Diagnosis not present

## 2017-03-14 DIAGNOSIS — I509 Heart failure, unspecified: Secondary | ICD-10-CM | POA: Diagnosis not present

## 2017-03-16 NOTE — Telephone Encounter (Signed)
pls call and see if Dr Erik Obey will see her for epistaxis or anyone in that office please

## 2017-03-19 ENCOUNTER — Ambulatory Visit (INDEPENDENT_AMBULATORY_CARE_PROVIDER_SITE_OTHER): Payer: Medicare HMO | Admitting: Neurology

## 2017-03-19 ENCOUNTER — Encounter: Payer: Self-pay | Admitting: Neurology

## 2017-03-19 VITALS — BP 131/55 | HR 71 | Ht 60.0 in | Wt 156.5 lb

## 2017-03-19 DIAGNOSIS — G43009 Migraine without aura, not intractable, without status migrainosus: Secondary | ICD-10-CM

## 2017-03-19 MED ORDER — LAMOTRIGINE 100 MG PO TABS
100.0000 mg | ORAL_TABLET | Freq: Two times a day (BID) | ORAL | 3 refills | Status: DC
Start: 2017-03-19 — End: 2017-04-02

## 2017-03-19 NOTE — Progress Notes (Signed)
Reason for visit: Headache, history of seizures  [name] is an 67 y.o. female  History of present illness:  [name] is a 75 year old right-handed black female with a history of a frontal meningioma and a history of seizures.  The patient has chronic daily headaches.  The patient has been on a multitude of medications in the past, she suffers from polypharmacy currently.  The patient takes hydrocodone for her low back pain.  She has had trigger point injections around the head and neck which have been beneficial for headaches in the past.  I have recommended considering Botox, the patient wanted to "think about".  She returns for an evaluation.  She denies any further seizures, she remains on Lamictal, blood work showed good blood levels on her current dosing.  She does not operate a motor vehicle.   Past Medical History:  Diagnosis Date  . Anemia   . Anxiety    takes Ativan daily  . Arthritis   . Bipolar disorder ([place])    takes Risperdal nightly  . Blood transfusion   . Cancer ([place])    In her gum  . Carpal tunnel syndrome of right wrist 05/23/2011  . Cervical disc disorder with radiculopathy of cervical region 10/31/2012  . Chronic back pain   . Chronic idiopathic constipation   . Colon polyps   . COPD (chronic obstructive pulmonary disease) with chronic bronchitis ([place]) 09/16/2013   Office Spirometry 10/30/2013-submaximal effort based on appearance of loop and curve. Numbers would fit with severe restriction but her physiologic capability may be better than this. FVC 0.91/44%, and 10.74/45%, FEV1/FVC 0.81, FEF 25-75% 1.43/69%    . Depression    takes Zoloft daily  . Diabetes mellitus    Type II  . Diverticulosis    TCS 9/08 by Dr. Delfin Edis for diarrhea . Bx for micro scopic colitis negative.   . Fibromyalgia   . Frequent falls   . GERD (gastroesophageal reflux disease)    takes Aciphex daily  . Glaucoma    eye drops daily  . Gum symptoms    infection on antibiotic    . Hemiplegia affecting non-dominant side, post-stroke 08/02/2011  . Hyperlipidemia    takes Crestor daily  . Hypertension    takes Amlodipine,Metoprolol,and Clonidine daily  . Hypothyroidism    takes Synthroid daily  . IBS (irritable bowel syndrome)   . Insomnia    takes Trazodone nightly  . Metabolic encephalopathy 6/64/4034  . Migraines    chronic headaches  . Mononeuritis lower limb   . Osteoporosis   . Pancreatitis 2006   due to Depakote with normal EUS   . Schatzki's ring    non critical / EGD with ED 8/2011with RMR  . Seizures ([place])    takes Lamictal daily.Last seizure 3 yrs ago  . Sleep apnea    on CPAP  . Stroke [facility])    left sided weakness  . Tubular adenoma of colon     Past Surgical History:  Procedure Laterality Date  . ABDOMINAL HYSTERECTOMY  1978  . BACK SURGERY  July 2012  . BACTERIAL OVERGROWTH TEST N/A 05/05/2013   Procedure: BACTERIAL OVERGROWTH TEST;  Surgeon: Daneil Dolin, MD;  [facility];  Service: Endoscopy;  Laterality: N/A;  7:30  . BIOPSY THYROID  2009  . BRAIN SURGERY  11/2011   resection of meningioma  . BREAST REDUCTION SURGERY  1994  . CARDIAC CATHETERIZATION  05/10/2005   normal coronaries, normal  LV systolic function and EF (Dr. Jackie Plum)  . CARPAL TUNNEL RELEASE Left 07/22/04   Dr. Aline Brochure  . CATARACT EXTRACTION Bilateral   . CHOLECYSTECTOMY  1984  . COLONOSCOPY N/A 09/25/2012   FBP:ZWCHENI diverticulosis.  colonic polyp-removed : tubular adenoma  . CRANIOTOMY  11/23/2011   Procedure: CRANIOTOMY TUMOR EXCISION;  Surgeon: Hosie Spangle, MD;  Location: [place];  Service: Neurosurgery;  Laterality: N/A;  Craniotomy for tumor resection  . ESOPHAGOGASTRODUODENOSCOPY  12/29/2010   Rourk-Retained food in the esophagus and stomach, small hiatal hernia, status post Maloney dilation of the esophagus  . ESOPHAGOGASTRODUODENOSCOPY N/A 09/25/2012   DPO:EUMPNTIR atonic baggy esophagus status post Maloney dilation 21 F. Hiatal  hernia  . GIVENS CAPSULE STUDY N/A 01/15/2013   NORMAL.   . IR GENERIC HISTORICAL  03/17/2016   IR RADIOLOGIST EVAL & MGMT 03/17/2016 [facility]-INTER[facility] RAD  . LESION REMOVAL N/A 05/31/2015   Procedure: REMOVAL RIGHT AND LEFT LESIONS OF MANDIBLE;  Surgeon: Diona Browner, DDS;  Location: [facility];  Service: Oral Surgery;  Laterality: N/A;  . MALONEY DILATION  12/29/2010   RMR;  . NM MYOCAR PERF WALL MOTION  2006   "relavtiely normal" persantine, mild anterior thinning (breast attenuation artifact), no region of scar/ischemia  . OVARIAN CYST REMOVAL    . RECTOCELE REPAIR N/A 06/29/2015   Procedure: POSTERIOR REPAIR (RECTOCELE);  Surgeon: Jonnie Kind, MD;  Location: [facility];  Service: Gynecology;  Laterality: N/A;  . REDUCTION MAMMAPLASTY Bilateral   . SPINE SURGERY  09/29/2010   Dr. Rolena Infante  . surgical excision of 3 tumors from right thigh and right buttock  and left upper thigh  2010  . TOOTH EXTRACTION Bilateral 12/14/2014   Procedure: REMOVAL OF BILATERAL MANDIBULAR EXOSTOSES;  Surgeon: Diona Browner, DDS;  Location: [facility];  Service: Oral Surgery;  Laterality: Bilateral;  . TRANSTHORACIC ECHOCARDIOGRAM  2010   EF 60-65%, mild conc LVH, grade 1 diastolic dysfunction; mildly calcified MV annulus with mildly thickened leaflets, mildly calcified MR annulus    Family History  Problem Relation Age of Onset  . Heart attack Mother        HTN  . Pneumonia Father   . Kidney failure Father   . Diabetes Father   . Pancreatic cancer Sister   . Diabetes Brother   . Hypertension Brother   . Diabetes Brother   . Cancer Sister        breast   . Hypertension Son   . Sleep apnea Son   . Cancer Sister        pancreatic  . Stroke Maternal Grandmother   . Heart attack Maternal Grandfather   . Alcohol abuse Maternal Uncle   . Colon cancer Neg Hx   . Anesthesia problems Neg Hx   . Hypotension Neg Hx   . Malignant hyperthermia Neg Hx   . Pseudochol deficiency Neg Hx     Social history:  reports that she has  been smoking cigarettes.  She has a 1.75 pack-year smoking history. she has never used smokeless tobacco. She reports that she does not drink alcohol or use drugs.    Allergies  Allergen Reactions  . Cephalexin Hives  . Iron Nausea And Vomiting  . Milk-Related Compounds Other (See Comments)    Doesn't agree with stomach.   . Penicillins Hives    Has patient had a PCN reaction causing immediate rash, facial/tongue/throat swelling, SOB or lightheadedness with hypotension: Yes Has patient had a PCN reaction causing severe rash  involving mucus membranes or skin necrosis: No Has patient had a PCN reaction that required hospitalization No Has patient had a PCN reaction occurring within the last 10 years: No If all of the above answers are "NO", then may proceed with Cephalosporin use.   [name] Phenazopyridine Hcl Hives          Medications:  Prior to Admission medications   Medication Sig Start Date End Date Taking? Authorizing Provider  albuterol (PROVENTIL HFA;VENTOLIN HFA) 108 (90 Base) MCG/ACT inhaler Inhale 1-2 puffs every 6 hours as needed for wheezing, shortness of breath 07/27/16  Yes Young, Tarri Fuller D, MD  aspirin EC 81 MG tablet Take 81 mg by mouth daily.   Yes [provider]  Cholecalciferol (VITAMIN D) 2000 units CAPS Take 1 capsule by mouth daily.   Yes [provider]  cloNIDine (CATAPRES) 0.3 MG tablet TAKE 1 TABLET BY MOUTH EVERY EIGHT HOURS. ONCE AT 8AM, 4PM, AND 12 MIDNIGHT. 01/23/17  Yes [name], MD  cyclobenzaprine (FLEXERIL) 5 MG tablet Take 1 tablet (5 mg total) by mouth 3 (three) times daily as needed. 12/16/16  Yes Rolland Porter, MD  diclofenac sodium (VOLTAREN) 1 % GEL Apply 2 g topically daily as needed (Pain). 07/21/16  Yes [name], MD  dicyclomine (BENTYL) 10 MG capsule Take 10 mg by mouth 4 (four) times daily -  before meals and at bedtime.   Yes [provider]  docusate sodium (COLACE) 100 MG capsule Take 200 mg by mouth  at bedtime.   Yes [provider]  fluticasone (FLONASE) 50 MCG/ACT nasal spray Place 2 sprays into both nostrils daily.   Yes [provider]  hydroxychloroquine (PLAQUENIL) 200 MG tablet Take 200 mg by mouth 2 (two) times daily.    Yes [provider]  insulin aspart (NOVOLOG FLEXPEN) 100 UNIT/ML FlexPen INJECT 17 TO 24 UNITS INTO THE SKIN 3 (THREE) TIMES DAILY WITH MEALS. 11/06/16  Yes Philemon Kingdom, MD  Insulin Glargine (TOUJEO SOLOSTAR) 300 UNIT/ML SOPN Inject 30 Units into the skin at bedtime. 11/06/16  Yes Philemon Kingdom, MD  lamoTRIgine (LAMICTAL) 100 MG tablet TAKE 1 TABLET BY MOUTH TWICE DAILY. 02/08/17  Yes Jaffe, Adam R, DO  levothyroxine (SYNTHROID, LEVOTHROID) 50 MCG tablet TAKE 1 TABLET BY MOUTH ONCE DAILY AND 1/2 TABLET ON SUNDAYS. 01/23/17  Yes [name], MD  LORazepam (ATIVAN) 0.5 MG tablet Take 1 tablet (0.5 mg total) by mouth 3 (three) times daily. 02/08/17 02/08/18 Yes Cloria Spring, MD  losartan (COZAAR) 50 MG tablet Take 1 tablet (50 mg total) daily by mouth. 01/23/17  Yes [name], MD  metoprolol tartrate (LOPRESSOR) 50 MG tablet TAKE 1 TABLET BY MOUTH TWICE DAILY. 03/09/17  Yes Hilty, Nadean Corwin, MD  montelukast (SINGULAIR) 10 MG tablet TAKE ONE TABLET BY MOUTH ONCE DAILY. 02/08/17  Yes [name], MD  Multiple Vitamins-Minerals (ONE-A-DAY 50 PLUS PO) Take 1 tablet by mouth daily.   Yes [provider]  nicotine (NICODERM CQ - DOSED IN MG/24 HOURS) 14 mg/24hr patch Place 1 patch (14 mg total) onto the skin daily. 03/01/17  Yes [name], MD  pregabalin (LYRICA) 75 MG capsule Take 1 capsule (75 mg total) by mouth daily. 10/20/16  Yes Rexene Alberts, MD  RABEprazole (ACIPHEX) 20 MG tablet TAKE 1 TABLET BY MOUTH TWICE DAILY. 12/01/16  Yes Pyrtle, Lajuan Lines, MD  RESTASIS 0.05 % ophthalmic emulsion Place 1 drop into both eyes 2 (two) times daily.  02/04/14  Yes [provider]  risperiDONE  (RISPERDAL) 0.5 MG tablet Take 1 tablet (0.5 mg total) at bedtime by mouth. 01/24/17  Yes Cloria Spring, MD  rosuvastatin (CRESTOR) 5 MG tablet TAKE 1 TABLET BY MOUTH AT BEDTIME. 01/08/17  Yes [name], MD  sertraline (ZOLOFT) 100 MG tablet Take 1 tablet (100 mg total) daily by mouth. 01/24/17  Yes Cloria Spring, MD  traZODone (DESYREL) 100 MG tablet Take 1 tablet (100 mg total) at bedtime by mouth. 01/24/17  Yes Cloria Spring, MD  UNABLE TO [facility] bed mattress x 1  DX: G47.33, J44.9 01/23/17  Yes [name], MD    ROS:  Out of a complete 14 system review of symptoms, the patient complains only of the following symptoms, and all other reviewed systems are negative.  Headaches Low back pain   Blood pressure (!) 131/55, pulse 71, height 5' (1.524 m), weight 156 lb 8 oz (71 kg).  Physical Exam  General: The patient is alert and cooperative at the time of the examination.  Skin: No significant peripheral edema is noted.   Neurologic Exam  Mental status: The patient is alert and oriented x 3 at the time of the examination. The patient has apparent normal recent and remote memory, with an apparently normal attention span and concentration ability.   Cranial nerves: Facial symmetry is present. Speech is normal, no aphasia or dysarthria is noted. Extraocular movements are full. Visual fields are full.  Motor: The patient has good strength in all 4 extremities.  Sensory examination: Soft touch sensation is symmetric on the face, arms, and legs.  Coordination: The patient has good finger-nose-finger and heel-to-shin bilaterally.  Gait and station: The patient has a slightly wide-based, slow gait.  Tandem gait was not attempted. Romberg is negative. No drift is seen.  Reflexes: Deep tendon reflexes are symmetric.   CT brain 10/15/16:  IMPRESSION: 1. Status post bifrontal craniotomy. 2. Soft tissue density in the region of the known planum  sphenoidale meningioma shows no evidence for edema or mass effect by CT. Consider MRI for more accurate comparison. 3. No evidence for acute injury.   Assessment/Plan:  1.  Chronic daily headaches  2.  History of seizures  3.  Chronic low back pain  4.  Polypharmacy  I do not wish to start another oral medication for this patient, she suffers from polypharmacy.  I discussed 2 options of treatment for her headaches to include Botox or Aimovig.  The patient does not seem to be able to make a decision regarding this today, she will call if she decides she wishes to pursue any 1 of these treatments.  She will continue the Lamictal, a prescription was sent in.  She will follow-up in 6 months.  Dr. Nelva Bush follows her for her low back pain.  Jill Alexanders MD 03/19/2017 3:17 PM  [facility] [address] [place], [place] [address]  Phone [phone] Fax [phone]

## 2017-03-20 ENCOUNTER — Ambulatory Visit (HOSPITAL_COMMUNITY): Payer: Self-pay | Admitting: Psychiatry

## 2017-03-21 ENCOUNTER — Other Ambulatory Visit: Payer: Self-pay

## 2017-03-21 ENCOUNTER — Emergency Department (HOSPITAL_COMMUNITY)
Admission: EM | Admit: 2017-03-21 | Discharge: 2017-03-21 | Disposition: A | Discharge status: Home / Self Care | Payer: Medicare HMO | Attending: Emergency Medicine | Admitting: Emergency Medicine

## 2017-03-21 ENCOUNTER — Encounter (HOSPITAL_COMMUNITY): Payer: Self-pay | Admitting: Emergency Medicine

## 2017-03-21 DIAGNOSIS — R040 Epistaxis: Secondary | ICD-10-CM | POA: Insufficient documentation

## 2017-03-21 DIAGNOSIS — Z5321 Procedure and treatment not carried out due to patient leaving prior to being seen by health care provider: Secondary | ICD-10-CM | POA: Diagnosis not present

## 2017-03-21 LAB — CBG MONITORING, ED: Glucose-Capillary: 170 mg/dL — ABNORMAL HIGH (ref 65–99)

## 2017-03-21 NOTE — ED Notes (Signed)
Pt states she is leaving and she already has an apt with ENT next week. Advised of risks and to come back if any worse. Pt agreed

## 2017-03-21 NOTE — ED Triage Notes (Addendum)
Pt c/o nose bleed while urinating today. Ems states pt bp was 200 over 80s upon their arrival. A/o. No active bleeding since arrival to ED. Per ems was large amount of blood. Pt takes 81mg  asa daily. Co headache for "weeks". Pt states she feesl fine and her normal self at this time

## 2017-03-22 ENCOUNTER — Encounter (HOSPITAL_COMMUNITY): Payer: Self-pay | Admitting: Psychiatry

## 2017-03-22 ENCOUNTER — Ambulatory Visit (INDEPENDENT_AMBULATORY_CARE_PROVIDER_SITE_OTHER): Payer: Medicare HMO | Admitting: Psychiatry

## 2017-03-22 ENCOUNTER — Ambulatory Visit (INDEPENDENT_AMBULATORY_CARE_PROVIDER_SITE_OTHER): Payer: Medicare HMO | Admitting: Otolaryngology

## 2017-03-22 DIAGNOSIS — R040 Epistaxis: Secondary | ICD-10-CM

## 2017-03-22 DIAGNOSIS — F331 Major depressive disorder, recurrent, moderate: Secondary | ICD-10-CM

## 2017-03-22 NOTE — Progress Notes (Signed)
Patient:  [name]               DOB: 1950-07-01          MR Number:  [id number]  Location:   [place]:  [address] [place],  [place],    [place]          Start:  Thursday 03/22/2017 8:38 AM  End:       Thursday 03/22/2017 9:10 AM   Provider/Observer:     Maurice Small, MSW, LCSW   Chief Complaint:                                                    Depression  Reason For Service:               [name] is a 67 y.o. female who presents with a long standing history of recurrent periods of depression beginning when she was 1 and her favorite uncle died. She has been experiencing increased symptoms of depression recently due to stress in the relationship with her son. Per patient's report, he rarely visits her anymore due to his involvement with an older woman who is controlling. She also reports frustration regarding her knees who is her power of attorn      ey and is very demanding per      patient's report. She reports issues with other family members and states she needs help dealing with her family Patient reports multiple psychiatric hospitlaizations due to depression and suicidal ideations with thel last one occuring in 1997. Patient has participated in outpatient psychotherapy and medication management intermittently since age 32.  She currently is seeing psychiatrist Dr. Harrington Challenger . Prior to this, she was being seen at [facility]. Patient also has had ECT at [facility].    Interventions Strategy:  Supportive  Participation Level:   Active      Participation Quality:  Appropriate      Behavioral Observation:  Casual, well groomed, speech very slow   Current Psychosocial Factors: Tension in relationship with son, multiple health issues, concerns about grandson, discord with home health aide  Content of Session:   reviewed symptoms, discussed stressors, facilitated expression of thoughts and feelings regarding stressors, discussed ways to use her spirituality in  coping with pain and chronic health issues to avoid relapse of depression  Current Status:   Fluctuations in mood, worry  Suicidal/Homicidal:    No   Patient Progress:   Patient last was seen about 5 weeks ago. She continues to experience multiple chronic health issues and expresses frustration regarding attending multiple medical appointments. She reports increased nose bleeds in the past several weeks and says she went to the ER last night. She also reports increased sleeping and says she has a diagnosis of narcolepsy. She reports that she was alone on Christmas but says her grandson came by. However, she reports she did not hear him due to excessive sleeping. Patient states she has gotten used to be alone as sometimes prefers to be alone. She still does attend church and reports having a very close friend who comes by to see her and pray with her.   Target Goals:   1.  Learn and implement behavioral strategies to overcome depression.    2.  Verbalize an understanding and resolution of current interpersonal  problems.   Last Reviewed:   10/13/2016  Goals Addressed Today:    2  Plan:      Return again in 2 - 3 weeks.  Impression/Diagnosis:   Patient presents with long standing history of recurrent periods  of depression beginning when she was [age] and her favorite uncle died. Patient reports multiple psychiatric hospitlaizations due to depression and suicidal ideations with thel last one occuring in 1997. Patient has participated in outpatient psychotherapy and medication management intermittently since age 70.  She currently is seeing psychiatrist Dr. Harrington Challenger . Prior to this, she was being seen at [facility]. Patient also has had ECT at [facility]. Symptoms have worsened in recent months due to family stress and have  included depressed mood, anxiety, excessive worry, and tearfulness.   Diagnosis:  Axis I: MDD recurrent episode, moderate          Axis II: Deferred     [name] Zemanek,  LCSW 03/22/2017

## 2017-03-22 NOTE — Telephone Encounter (Signed)
closing

## 2017-03-26 ENCOUNTER — Emergency Department (HOSPITAL_COMMUNITY): Payer: Medicare HMO

## 2017-03-26 ENCOUNTER — Emergency Department (HOSPITAL_COMMUNITY)
Admission: EM | Admit: 2017-03-26 | Discharge: 2017-03-26 | Disposition: A | Discharge status: Home / Self Care | Payer: Medicare HMO | Attending: Emergency Medicine | Admitting: Emergency Medicine

## 2017-03-26 ENCOUNTER — Encounter (HOSPITAL_COMMUNITY): Payer: Self-pay

## 2017-03-26 DIAGNOSIS — F17210 Nicotine dependence, cigarettes, uncomplicated: Secondary | ICD-10-CM | POA: Diagnosis not present

## 2017-03-26 DIAGNOSIS — Z7982 Long term (current) use of aspirin: Secondary | ICD-10-CM | POA: Diagnosis not present

## 2017-03-26 DIAGNOSIS — M542 Cervicalgia: Secondary | ICD-10-CM | POA: Diagnosis not present

## 2017-03-26 DIAGNOSIS — I10 Essential (primary) hypertension: Secondary | ICD-10-CM | POA: Insufficient documentation

## 2017-03-26 DIAGNOSIS — Z794 Long term (current) use of insulin: Secondary | ICD-10-CM | POA: Insufficient documentation

## 2017-03-26 DIAGNOSIS — Z79899 Other long term (current) drug therapy: Secondary | ICD-10-CM | POA: Diagnosis not present

## 2017-03-26 DIAGNOSIS — R531 Weakness: Secondary | ICD-10-CM | POA: Insufficient documentation

## 2017-03-26 DIAGNOSIS — R9431 Abnormal electrocardiogram [ECG] [EKG]: Secondary | ICD-10-CM | POA: Diagnosis not present

## 2017-03-26 DIAGNOSIS — R404 Transient alteration of awareness: Secondary | ICD-10-CM | POA: Diagnosis not present

## 2017-03-26 DIAGNOSIS — J449 Chronic obstructive pulmonary disease, unspecified: Secondary | ICD-10-CM | POA: Insufficient documentation

## 2017-03-26 DIAGNOSIS — G8929 Other chronic pain: Secondary | ICD-10-CM

## 2017-03-26 DIAGNOSIS — R51 Headache: Secondary | ICD-10-CM | POA: Diagnosis not present

## 2017-03-26 DIAGNOSIS — E1142 Type 2 diabetes mellitus with diabetic polyneuropathy: Secondary | ICD-10-CM | POA: Insufficient documentation

## 2017-03-26 DIAGNOSIS — E039 Hypothyroidism, unspecified: Secondary | ICD-10-CM | POA: Insufficient documentation

## 2017-03-26 LAB — CBC WITH DIFFERENTIAL/PLATELET
BASOS ABS: 0 10*3/uL (ref 0.0–0.1)
Basophils Relative: 0 %
Eosinophils Absolute: 0.1 10*3/uL (ref 0.0–0.7)
Eosinophils Relative: 1 %
HEMATOCRIT: 30.5 % — AB (ref 36.0–46.0)
Hemoglobin: 8.9 g/dL — ABNORMAL LOW (ref 12.0–15.0)
LYMPHS PCT: 21 %
Lymphs Abs: 1.4 10*3/uL (ref 0.7–4.0)
MCH: 22.3 pg — ABNORMAL LOW (ref 26.0–34.0)
MCHC: 29.2 g/dL — ABNORMAL LOW (ref 30.0–36.0)
MCV: 76.4 fL — AB (ref 78.0–100.0)
Monocytes Absolute: 0.5 10*3/uL (ref 0.1–1.0)
Monocytes Relative: 7 %
Neutro Abs: 4.9 10*3/uL (ref 1.7–7.7)
Neutrophils Relative %: 71 %
PLATELETS: 228 10*3/uL (ref 150–400)
RBC: 3.99 MIL/uL (ref 3.87–5.11)
RDW: 14.7 % (ref 11.5–15.5)
WBC: 6.9 10*3/uL (ref 4.0–10.5)

## 2017-03-26 LAB — BASIC METABOLIC PANEL
Anion gap: 9 (ref 5–15)
BUN: 24 mg/dL — ABNORMAL HIGH (ref 6–20)
CO2: 22 mmol/L (ref 22–32)
Calcium: 9.4 mg/dL (ref 8.9–10.3)
Chloride: 110 mmol/L (ref 101–111)
Creatinine, Ser: 0.99 mg/dL (ref 0.44–1.00)
GFR, EST NON AFRICAN AMERICAN: 58 mL/min — AB (ref >60)
Glucose, Bld: 68 mg/dL (ref 65–99)
POTASSIUM: 3.9 mmol/L (ref 3.5–5.1)
SODIUM: 141 mmol/L (ref 135–145)

## 2017-03-26 MED ORDER — KETOROLAC TROMETHAMINE 30 MG/ML IJ SOLN
15.0000 mg | Freq: Once | INTRAMUSCULAR | Status: AC
  Administered 2017-03-26: 15 mg via INTRAVENOUS
  Filled 2017-03-26: qty 1

## 2017-03-26 MED ORDER — METOCLOPRAMIDE HCL 5 MG/ML IJ SOLN
10.0000 mg | Freq: Once | INTRAMUSCULAR | Status: AC
  Administered 2017-03-26: 10 mg via INTRAVENOUS
  Filled 2017-03-26: qty 2

## 2017-03-26 MED ORDER — SODIUM CHLORIDE 0.9 % IV BOLUS (SEPSIS)
1000.0000 mL | Freq: Once | INTRAVENOUS | Status: AC
  Administered 2017-03-26: 1000 mL via INTRAVENOUS

## 2017-03-26 MED ORDER — TRAMADOL HCL 50 MG PO TABS
50.0000 mg | ORAL_TABLET | Freq: Once | ORAL | Status: AC
  Administered 2017-03-26: 50 mg via ORAL
  Filled 2017-03-26: qty 1

## 2017-03-26 MED ORDER — METHOCARBAMOL 500 MG PO TABS
1000.0000 mg | ORAL_TABLET | Freq: Once | ORAL | Status: AC
  Administered 2017-03-26: 1000 mg via ORAL
  Filled 2017-03-26: qty 2

## 2017-03-26 NOTE — ED Notes (Signed)
Assisted pt with bedside comode 

## 2017-03-26 NOTE — ED Triage Notes (Signed)
EMS reports pt has brain tumor and history of stroke.  Reports pt was left with left sided weakness and slurred speech.  Called ems today for c/o headache, dizziness, generalized weakness, and pain in upper back.  Reports headache and dizziness x 3 months but worse since last night.  Reports pain is reproducible.  CBG 100 per ems.

## 2017-03-26 NOTE — ED Provider Notes (Signed)
[facility] EMERGENCY DEPARTMENT Provider Note   CSN: [id number] Arrival date & time: 03/26/17  1540     History   Chief Complaint Chief Complaint  Patient presents with  . Headache  . Weakness    HPI [name] is a 67 y.o. female.  Plan is headache and neck pain.  HPI is pages 84.  She follows with Dr. Margette Fast of neurology for chronic headache.  She has chronic back pain.  She states that she fell a few months ago and hit her head and that she has had headaches and back and neck pain since then.  She saw Dr. Jannifer Franklin 7 days ago and his note states that he is offered her monthly injections for migraine prevention, or Botox.  She declined to these initially and wants to "think about it".  She has history of frontal lobe tumor excision and known meningioma which has been stable radiographically.  Should any numbness or weakness.  No stroke symptoms.  No nausea or vomiting.  No chest pain or shortness of breath.  Past Medical History:  Diagnosis Date  . Anemia   . Anxiety    takes Ativan daily  . Arthritis   . Bipolar disorder ([place])    takes Risperdal nightly  . Blood transfusion   . Cancer ([place])    In her gum  . Carpal tunnel syndrome of right wrist 05/23/2011  . Cervical disc disorder with radiculopathy of cervical region 10/31/2012  . Chronic back pain   . Chronic idiopathic constipation   . Colon polyps   . COPD (chronic obstructive pulmonary disease) with chronic bronchitis ([place]) 09/16/2013   Office Spirometry 10/30/2013-submaximal effort based on appearance of loop and curve. Numbers would fit with severe restriction but her physiologic capability may be better than this. FVC 0.91/44%, and 10.74/45%, FEV1/FVC 0.81, FEF 25-75% 1.43/69%    . Depression    takes Zoloft daily  . Diabetes mellitus    Type II  . Diverticulosis    TCS 9/08 by Dr. Delfin Edis for diarrhea . Bx for micro scopic colitis negative.   . Fibromyalgia   . Frequent falls   . GERD  (gastroesophageal reflux disease)    takes Aciphex daily  . Glaucoma    eye drops daily  . Gum symptoms    infection on antibiotic  . Hemiplegia affecting non-dominant side, post-stroke 08/02/2011  . Hyperlipidemia    takes Crestor daily  . Hypertension    takes Amlodipine,Metoprolol,and Clonidine daily  . Hypothyroidism    takes Synthroid daily  . IBS (irritable bowel syndrome)   . Insomnia    takes Trazodone nightly  . Metabolic encephalopathy 0/62/3762  . Migraines    chronic headaches  . Mononeuritis lower limb   . Osteoporosis   . Pancreatitis 2006   due to Depakote with normal EUS   . Schatzki's ring    non critical / EGD with ED 8/2011with RMR  . Seizures ([place])    takes Lamictal daily.Last seizure 3 yrs ago  . Sleep apnea    on CPAP  . Stroke [facility])    left sided weakness  . Tubular adenoma of colon     Patient Active Problem List   Diagnosis Date Noted  . Finger pain, right 01/28/2017  . Hypotension   . Diabetic polyneuropathy associated with type 2 diabetes mellitus ([place]) 05/19/2016  . Smoker 04/24/2016  . History of palpitations 08/09/2015  . Nausea without vomiting 08/09/2015  . Labile hypertension 08/03/2015  .  Normal coronary arteries 08/03/2015  . Left-sided low back pain with left-sided sciatica 06/27/2015  . Left flank pain 06/27/2015  . Diabetes mellitus type 2 in obese ([place]) 05/06/2015  . Multinodular goiter 05/06/2015  . Rectocele, female 04/27/2015  . Anal sphincter incontinence 04/27/2015  . Pelvic relaxation due to rectocele 03/30/2015  . Pulmonary hypertension ([place]) 02/22/2015  . Light cigarette smoker (1-9 cigarettes per day) 01/11/2015  . Annual physical exam 01/05/2015  . Migraine without aura and without status migrainosus, not intractable 07/02/2014  . Flatulence 02/18/2014  . Microcytic anemia 02/18/2014  . COPD (chronic obstructive pulmonary disease) with chronic bronchitis ([place]) 09/16/2013  . Hypothyroidism 08/16/2013  .  Gastroparesis 04/28/2013  . Seizure disorder ([place]) 01/19/2013  . Cervical disc disorder with radiculopathy of cervical region 10/31/2012  . Solitary pulmonary nodule 08/19/2012  . Anemia 07/05/2012  . 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 Allergic sinusitis 04/18/2012  . Meningioma ([place]) 11/19/2011  . 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 Carpal tunnel syndrome of right wrist 05/23/2011  . Polypharmacy 04/28/2011  . Bipolar disorder ([place]) 04/28/2011  . Constipation 04/13/2011  . Falls frequently 12/12/2010  . Oropharyngeal dysphagia 07/12/2010  . Urinary incontinence 12/16/2009  . HEARING LOSS 10/26/2009  . Hyperlipidemia 12/11/2008  . IBS 12/11/2008  . GERD 07/29/2008  . MILK PRODUCTS ALLERGY 07/29/2008  . 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 Backache 06/19/2007  . Osteoporosis 06/19/2007  . Obstructive sleep apnea 06/19/2007  . TRIGGER FINGER 04/18/2007  . DIVERTICULOSIS, COLON 11/13/2006    Past Surgical History:  Procedure Laterality Date  . ABDOMINAL HYSTERECTOMY  1978  . BACK SURGERY  July 2012  . BACTERIAL OVERGROWTH TEST N/A 05/05/2013   Procedure: BACTERIAL OVERGROWTH TEST;  Surgeon: Daneil Dolin, MD;  Location: [facility] ENDO SUITE;  Service: Endoscopy;  Laterality: N/A;  7:30  . BIOPSY THYROID  2009  . BRAIN SURGERY  11/2011   resection of meningioma  . BREAST REDUCTION SURGERY  1994  . CARDIAC CATHETERIZATION  05/10/2005   normal coronaries, normal LV systolic function and EF (Dr. Jackie Plum)  . CARPAL TUNNEL RELEASE Left 07/22/04   Dr. Aline Brochure  . CATARACT EXTRACTION Bilateral   . CHOLECYSTECTOMY  1984  . COLONOSCOPY N/A 09/25/2012   [id number] diverticulosis.  colonic polyp-removed : tubular adenoma  . CRANIOTOMY  11/23/2011   Procedure: CRANIOTOMY TUMOR EXCISION;  Surgeon: Hosie Spangle, MD;  Location: [place] NEURO ORS;  Service: Neurosurgery;  Laterality: N/A;  Craniotomy for tumor resection  .  ESOPHAGOGASTRODUODENOSCOPY  12/29/2010   [place]-Retained food in the esophagus and stomach, small hiatal hernia, status post Maloney dilation of the esophagus  . ESOPHAGOGASTRODUODENOSCOPY N/A 09/25/2012   [id number] atonic baggy esophagus status post Maloney dilation 60 F. Hiatal hernia  . GIVENS CAPSULE STUDY N/A 01/15/2013   NORMAL.   . IR GENERIC HISTORICAL  03/17/2016   IR RADIOLOGIST EVAL & MGMT 03/17/2016 [facility]-INTERV RAD  . LESION REMOVAL N/A 05/31/2015   Procedure: REMOVAL RIGHT AND LEFT LESIONS OF MANDIBLE;  Surgeon: Diona Browner, DDS;  Location: [place];  Service: Oral Surgery;  Laterality: N/A;  . MALONEY DILATION  12/29/2010   RMR;  . NM MYOCAR PERF WALL MOTION  2006   "relavtiely normal" persantine, mild anterior thinning (breast attenuation artifact), no region of scar/ischemia  . OVARIAN CYST REMOVAL    . RECTOCELE REPAIR N/A 06/29/2015   Procedure: POSTERIOR REPAIR (RECTOCELE);  Surgeon: Jonnie Kind, MD;  Location: [facility]  ORS;  Service: Gynecology;  Laterality: N/A;  . REDUCTION MAMMAPLASTY Bilateral   . SPINE SURGERY  09/29/2010   Dr. Rolena Infante  . surgical excision of 3 tumors from right thigh and right buttock  and left upper thigh  2010  . TOOTH EXTRACTION Bilateral 12/14/2014   Procedure: REMOVAL OF BILATERAL MANDIBULAR EXOSTOSES;  Surgeon: Diona Browner, DDS;  Location: [place];  Service: Oral Surgery;  Laterality: Bilateral;  . TRANSTHORACIC ECHOCARDIOGRAM  2010   EF 60-65%, mild conc LVH, grade 1 diastolic dysfunction; mildly calcified MV annulus with mildly thickened leaflets, mildly calcified MR annulus    OB History    Gravida Para Term Preterm AB Living   6 1 1   5      SAB TAB Ectopic Multiple Live Births   5               Home Medications    Prior to Admission medications   Medication Sig Start Date End Date Taking? Authorizing Provider  albuterol (PROVENTIL HFA;VENTOLIN HFA) 108 (90 Base) MCG/ACT inhaler Inhale 1-2 puffs every 6 hours as needed for wheezing,  shortness of breath 07/27/16   Baird Lyons D, MD  aspirin EC 81 MG tablet Take 81 mg by mouth daily.    [provider]  Cholecalciferol (VITAMIN D) 2000 units CAPS Take 1 capsule by mouth daily.    [provider]  cloNIDine (CATAPRES) 0.3 MG tablet TAKE 1 TABLET BY MOUTH EVERY EIGHT HOURS. ONCE AT 8AM, 4PM, AND 12 MIDNIGHT. 01/23/17   [name], MD  cyclobenzaprine (FLEXERIL) 5 MG tablet Take 1 tablet (5 mg total) by mouth 3 (three) times daily as needed. 12/16/16   Rolland Porter, MD  diclofenac sodium (VOLTAREN) 1 % GEL Apply 2 g topically daily as needed (Pain). 07/21/16   [name], MD  dicyclomine (BENTYL) 10 MG capsule Take 10 mg by mouth 4 (four) times daily -  before meals and at bedtime.    [provider]  docusate sodium (COLACE) 100 MG capsule Take 200 mg by mouth at bedtime.    [provider]  fluticasone (FLONASE) 50 MCG/ACT nasal spray Place 2 sprays into both nostrils daily.    [provider]  HYDROcodone-acetaminophen (NORCO/VICODIN) 5-325 MG tablet Take 1 tablet by mouth 3 (three) times daily as needed for moderate pain.    [provider]  hydroxychloroquine (PLAQUENIL) 200 MG tablet Take 200 mg by mouth 2 (two) times daily.     [provider]  insulin aspart (NOVOLOG FLEXPEN) 100 UNIT/ML FlexPen INJECT 17 TO 24 UNITS INTO THE SKIN 3 (THREE) TIMES DAILY WITH MEALS. 11/06/16   Philemon Kingdom, MD  Insulin Glargine (TOUJEO SOLOSTAR) 300 UNIT/ML SOPN Inject 30 Units into the skin at bedtime. 11/06/16   Philemon Kingdom, MD  lamoTRIgine (LAMICTAL) 100 MG tablet Take 1 tablet (100 mg total) by mouth 2 (two) times daily. 03/19/17   Kathrynn Ducking, MD  levothyroxine (SYNTHROID, LEVOTHROID) 50 MCG tablet TAKE 1 TABLET BY MOUTH ONCE DAILY AND 1/2 TABLET ON SUNDAYS. 01/23/17   [name], MD  LORazepam (ATIVAN) 0.5 MG tablet Take 1 tablet (0.5 mg total) by mouth 3 (three) times daily. 02/08/17  02/08/18  Cloria Spring, MD  losartan (COZAAR) 50 MG tablet Take 1 tablet (50 mg total) daily by mouth. 01/23/17   [name], MD  metoprolol tartrate (LOPRESSOR) 50 MG tablet TAKE 1 TABLET BY MOUTH TWICE DAILY. 03/09/17   Hilty, Nadean Corwin,  MD  montelukast (SINGULAIR) 10 MG tablet TAKE ONE TABLET BY MOUTH ONCE DAILY. 02/08/17   [name], MD  Multiple Vitamins-Minerals (ONE-A-DAY 50 PLUS PO) Take 1 tablet by mouth daily.    [provider]  nicotine (NICODERM CQ - DOSED IN MG/24 HOURS) 14 mg/24hr patch Place 1 patch (14 mg total) onto the skin daily. 03/01/17   [name], MD  pregabalin (LYRICA) 75 MG capsule Take 1 capsule (75 mg total) by mouth daily. 10/20/16   Rexene Alberts, MD  RABEprazole (ACIPHEX) 20 MG tablet TAKE 1 TABLET BY MOUTH TWICE DAILY. 12/01/16   Pyrtle, Lajuan Lines, MD  RESTASIS 0.05 % ophthalmic emulsion Place 1 drop into both eyes 2 (two) times daily.  02/04/14   [provider]  risperiDONE (RISPERDAL) 0.5 MG tablet Take 1 tablet (0.5 mg total) at bedtime by mouth. 01/24/17   Cloria Spring, MD  rosuvastatin (CRESTOR) 5 MG tablet TAKE 1 TABLET BY MOUTH AT BEDTIME. 01/08/17   [name], MD  sertraline (ZOLOFT) 100 MG tablet Take 1 tablet (100 mg total) daily by mouth. 01/24/17   Cloria Spring, MD  traZODone (DESYREL) 100 MG tablet Take 1 tablet (100 mg total) at bedtime by mouth. 01/24/17   Cloria Spring, MD  UNABLE TO [facility] bed mattress x 1  DX: G47.33, J44.9 01/23/17   [name], MD    Family History Family History  Problem Relation Age of Onset  . Heart attack Mother        HTN  . Pneumonia Father   . Kidney failure Father   . Diabetes Father   . Pancreatic cancer Sister   . Diabetes Brother   . Hypertension Brother   . Diabetes Brother   . Cancer Sister        breast   . Hypertension Son   . Sleep apnea Son   . Cancer Sister        pancreatic  . Stroke Maternal Grandmother   . Heart  attack Maternal Grandfather   . Alcohol abuse Maternal Uncle   . Colon cancer Neg Hx   . Anesthesia problems Neg Hx   . Hypotension Neg Hx   . Malignant hyperthermia Neg Hx   . Pseudochol deficiency Neg Hx     Social History Social History   Tobacco Use  . Smoking status: Light Tobacco Smoker    Packs/day: 0.25    Years: 7.00    Pack years: 1.75    Types: Cigarettes  . Smokeless tobacco: Never Used  . Tobacco comment: continues to smoke 1/4 pack a day   Substance Use Topics  . Alcohol use: No    Alcohol/week: 0.0 oz    Comment:    . Drug use: No     Allergies   Cephalexin; Iron; Milk-related compounds; Penicillins; and Phenazopyridine hcl   Review of Systems Review of Systems  Constitutional: Negative for appetite change, chills, diaphoresis, fatigue and fever.  HENT: Negative for mouth sores, sore throat and trouble swallowing.   Eyes: Negative for visual disturbance.  Respiratory: Negative for cough, chest tightness, shortness of breath and wheezing.   Cardiovascular: Negative for chest pain.  Gastrointestinal: Negative for abdominal distention, abdominal pain, diarrhea, nausea and vomiting.  Endocrine: Negative for polydipsia, polyphagia and polyuria.  Genitourinary: Negative for dysuria, frequency and hematuria.  Musculoskeletal: Positive for neck pain. Negative for gait problem.  Skin: Negative for color change, pallor and rash.  Neurological: Positive for  headaches. Negative for dizziness, syncope and light-headedness.  Hematological: Does not bruise/bleed easily.  Psychiatric/Behavioral: Negative for behavioral problems and confusion.     Physical Exam Updated Vital Signs BP (!) 160/68 (BP Location: Left Arm)   Pulse 65   Temp 98.2 F (36.8 C) (Oral)   Resp 16   Ht 5\' 1"  (1.549 m)   Wt 70.8 kg (156 lb)   SpO2 99%   BMI 29.48 kg/m   Physical Exam  Constitutional: She is oriented to person, place, and time. She appears well-developed and  well-nourished. No distress.  HENT:  Head: Normocephalic.  Eyes: Conjunctivae are normal. Pupils are equal, round, and reactive to light. No scleral icterus.  Neck: Normal range of motion. Neck supple. No thyromegaly present.  Here to palpate a mass which of the right lateral neck and onto the right temporal scalp.  No lesions or vesicles.  Cardiovascular: Normal rate and regular rhythm. Exam reveals no gallop and no friction rub.  No murmur heard. Pulmonary/Chest: Effort normal and breath sounds normal. No respiratory distress. She has no wheezes. She has no rales.  Abdominal: Soft. Bowel sounds are normal. She exhibits no distension. There is no tenderness. There is no rebound.  Musculoskeletal: Normal range of motion.  Neurological: She is alert and oriented to person, place, and time.  Intact cranial nerves.  Normal gait.  Skin: Skin is warm and dry. No rash noted.  Psychiatric: She has a normal mood and affect. Her behavior is normal.     ED Treatments / Results  Labs (all labs ordered are listed, but only abnormal results are displayed) Labs Reviewed  BASIC METABOLIC PANEL - Abnormal; Notable for the following components:      Result Value   BUN 24 (*)    GFR calc non Af Amer 58 (*)    All other components within normal limits  CBC WITH DIFFERENTIAL/PLATELET - Abnormal; Notable for the following components:   Hemoglobin 8.9 (*)    HCT 30.5 (*)    MCV 76.4 (*)    MCH 22.3 (*)    MCHC 29.2 (*)    All other components within normal limits    EKG  EKG Interpretation None       Radiology Ct Head Wo Contrast  Result Date: 03/26/2017 CLINICAL DATA:  67 y/o F; history of planum sphenoidale meningioma and stroke. Three months of headaches and dizziness. EXAM: CT HEAD WITHOUT CONTRAST TECHNIQUE: Contiguous axial images were obtained from the base of the skull through the vertex without intravenous contrast. COMPARISON:  10/15/2016 CT head. FINDINGS: Brain: Stable anterior  and inferior paramedian frontal encephalomalacia. Grossly stable soft tissue in region of planum sphenoidale meningioma, partially obscured by streak artifact (series 5, image 27). No large acute stroke, hemorrhage, or focal mass effect identified. No herniation or effacement of basilar cisterns. Vascular: Calcific atherosclerosis of carotid siphons. No hyperdense vessel. Skull: Stable chronic postsurgical changes related to bifrontal craniotomy. Sinuses/Orbits: No acute finding. Bilateral intra-ocular lens replacement. Other: None. IMPRESSION: 1. No acute intracranial abnormality identified. Stable CT of the head. 2. Grossly stable soft tissue in region of planum sphenoidale meningioma, partially obscured by streak artifact. 3. Stable postsurgical changes related to bifrontal craniotomy and stable anterior frontal lobe encephalomalacia. Electronically Signed   By: Kristine Garbe M.D.   On: 03/26/2017 18:35    Procedures Procedures (including critical care time)  Medications Ordered in ED Medications  traMADol (ULTRAM) tablet 50 mg (not administered)  methocarbamol (ROBAXIN) tablet 1,000 mg (  1,000 mg Oral Given 03/26/17 1633)  metoCLOPramide (REGLAN) injection 10 mg (10 mg Intravenous Given 03/26/17 1633)  ketorolac (TORADOL) 30 MG/ML injection 15 mg (15 mg Intravenous Given 03/26/17 1633)  sodium chloride 0.9 % bolus 1,000 mL (0 mLs Intravenous Stopped 03/26/17 1915)     Initial Impression / Assessment and Plan / ED Course  I have reviewed the triage vital signs and the nursing notes.  Pertinent labs & imaging results that were available during my care of the patient were reviewed by me and considered in my medical decision making (see chart for details).    T scan shows stable postsurgical changes.  No leukocytosis or fever.  Given Robaxin and low-dose Toradol.  Her symptoms have improved but not resolved.  Plan is home.  Have encouraged her to follow-up with Dr. Jannifer Franklin further discuss  the options he offered her in his office 7 days ago.  Final Clinical Impressions(s) / ED Diagnoses   Final diagnoses:  Chronic nonintractable headache, unspecified headache type  Neck pain    ED Discharge Orders    None       Tanna Furry, MD 03/26/17 1918

## 2017-03-26 NOTE — Discharge Instructions (Signed)
Dr. Jannifer Franklin has offered a choice of medical treatments for your head and neck pain at her last visit. You should contact Dr. Jannifer Franklin, to further discuss these options for your chronic pain.

## 2017-03-28 DIAGNOSIS — Z0100 Encounter for examination of eyes and vision without abnormal findings: Secondary | ICD-10-CM | POA: Diagnosis not present

## 2017-03-28 DIAGNOSIS — R6889 Other general symptoms and signs: Secondary | ICD-10-CM | POA: Diagnosis not present

## 2017-03-28 LAB — HM DIABETES EYE EXAM

## 2017-03-30 ENCOUNTER — Telehealth: Payer: Self-pay | Admitting: Family Medicine

## 2017-03-30 NOTE — Telephone Encounter (Signed)
Patient left a msg that she thinks she is coming down with something and wanted someone to call her back. [phone]  I called her to ask her symptoms,  She is having  Chills,nausea, diarrhea Her temp is 99.7.  She is requesting medication because she has no transportation. I let her know we can not prescribe medication without being seen.   She understands that if she gets worse to call EMS.

## 2017-03-31 ENCOUNTER — Encounter (HOSPITAL_COMMUNITY): Payer: Self-pay | Admitting: *Deleted

## 2017-03-31 ENCOUNTER — Emergency Department (HOSPITAL_COMMUNITY)
Admission: EM | Admit: 2017-03-31 | Discharge: 2017-03-31 | Disposition: A | Discharge status: Home / Self Care | Payer: Medicare HMO | Attending: Emergency Medicine | Admitting: Emergency Medicine

## 2017-03-31 DIAGNOSIS — E039 Hypothyroidism, unspecified: Secondary | ICD-10-CM | POA: Insufficient documentation

## 2017-03-31 DIAGNOSIS — Z7982 Long term (current) use of aspirin: Secondary | ICD-10-CM | POA: Diagnosis not present

## 2017-03-31 DIAGNOSIS — E860 Dehydration: Secondary | ICD-10-CM | POA: Diagnosis not present

## 2017-03-31 DIAGNOSIS — N179 Acute kidney failure, unspecified: Secondary | ICD-10-CM

## 2017-03-31 DIAGNOSIS — Z794 Long term (current) use of insulin: Secondary | ICD-10-CM | POA: Insufficient documentation

## 2017-03-31 DIAGNOSIS — Z79899 Other long term (current) drug therapy: Secondary | ICD-10-CM | POA: Diagnosis not present

## 2017-03-31 DIAGNOSIS — J029 Acute pharyngitis, unspecified: Secondary | ICD-10-CM | POA: Insufficient documentation

## 2017-03-31 DIAGNOSIS — E1142 Type 2 diabetes mellitus with diabetic polyneuropathy: Secondary | ICD-10-CM | POA: Insufficient documentation

## 2017-03-31 DIAGNOSIS — R51 Headache: Secondary | ICD-10-CM

## 2017-03-31 DIAGNOSIS — R42 Dizziness and giddiness: Secondary | ICD-10-CM | POA: Diagnosis not present

## 2017-03-31 DIAGNOSIS — J449 Chronic obstructive pulmonary disease, unspecified: Secondary | ICD-10-CM | POA: Diagnosis not present

## 2017-03-31 DIAGNOSIS — F17210 Nicotine dependence, cigarettes, uncomplicated: Secondary | ICD-10-CM | POA: Insufficient documentation

## 2017-03-31 DIAGNOSIS — R519 Headache, unspecified: Secondary | ICD-10-CM

## 2017-03-31 LAB — URINALYSIS, ROUTINE W REFLEX MICROSCOPIC
Bilirubin Urine: NEGATIVE
Glucose, UA: NEGATIVE mg/dL
HGB URINE DIPSTICK: NEGATIVE
KETONES UR: NEGATIVE mg/dL
NITRITE: NEGATIVE
PROTEIN: 30 mg/dL — AB
Specific Gravity, Urine: 1.026 (ref 1.005–1.030)
pH: 5 (ref 5.0–8.0)

## 2017-03-31 LAB — I-STAT CHEM 8, ED
BUN: 23 mg/dL — AB (ref 6–20)
CALCIUM ION: 1.19 mmol/L (ref 1.15–1.40)
CHLORIDE: 108 mmol/L (ref 101–111)
Creatinine, Ser: 1.4 mg/dL — ABNORMAL HIGH (ref 0.44–1.00)
GLUCOSE: 128 mg/dL — AB (ref 65–99)
HCT: 31 % — ABNORMAL LOW (ref 36.0–46.0)
Hemoglobin: 10.5 g/dL — ABNORMAL LOW (ref 12.0–15.0)
Potassium: 4.4 mmol/L (ref 3.5–5.1)
Sodium: 142 mmol/L (ref 135–145)
TCO2: 27 mmol/L (ref 22–32)

## 2017-03-31 MED ORDER — SODIUM CHLORIDE 0.9 % IV BOLUS (SEPSIS)
1000.0000 mL | Freq: Once | INTRAVENOUS | Status: AC
  Administered 2017-03-31: 1000 mL via INTRAVENOUS

## 2017-03-31 MED ORDER — METOCLOPRAMIDE HCL 5 MG/ML IJ SOLN
10.0000 mg | Freq: Once | INTRAMUSCULAR | Status: AC
  Administered 2017-03-31: 10 mg via INTRAVENOUS
  Filled 2017-03-31: qty 2

## 2017-03-31 MED ORDER — DIPHENHYDRAMINE HCL 50 MG/ML IJ SOLN
25.0000 mg | Freq: Once | INTRAMUSCULAR | Status: AC
  Administered 2017-03-31: 25 mg via INTRAVENOUS
  Filled 2017-03-31: qty 1

## 2017-03-31 MED ORDER — DEXAMETHASONE SODIUM PHOSPHATE 10 MG/ML IJ SOLN
10.0000 mg | Freq: Once | INTRAMUSCULAR | Status: AC
  Administered 2017-03-31: 10 mg via INTRAVENOUS
  Filled 2017-03-31: qty 1

## 2017-03-31 NOTE — ED Triage Notes (Signed)
Pt with HA for months, dizziness started last night per pt.  Pt with hx of CVA and weakness on left since CVA

## 2017-03-31 NOTE — ED Provider Notes (Signed)
[facility] EMERGENCY DEPARTMENT Provider Note   CSN: [id number] Arrival date & time: 03/31/17  1044     History   Chief Complaint Chief Complaint  Patient presents with  . Headache  . Dizziness    HPI [name] is a 67 y.o. female.  HPI  67 year old who has no medical problems as documented below the presents to the emergency department today with chronic headache and dizziness that seems to be coming and going over the last few months.  Also states that she has a sore throat in the left side of her throat that is new.  Has seen her neurologist and primary doctor for the headache and dizziness multiple times.  Review of the notes it appears that she tried seeing her primary doctor yesterday however they told her they did not have appointments and she continued worsening to come to the emergency department.  No chest pain or vision changes.  No shortness of breath.  Does have cough and some postnasal drip.  No other associated modifying symptoms.  Past Medical History:  Diagnosis Date  . Anemia   . Anxiety    takes Ativan daily  . Arthritis   . Bipolar disorder ([place])    takes Risperdal nightly  . Blood transfusion   . Cancer ([place])    In her gum  . Carpal tunnel syndrome of right wrist 05/23/2011  . Cervical disc disorder with radiculopathy of cervical region 10/31/2012  . Chronic back pain   . Chronic idiopathic constipation   . Colon polyps   . COPD (chronic obstructive pulmonary disease) with chronic bronchitis ([place]) 09/16/2013   Office Spirometry 10/30/2013-submaximal effort based on appearance of loop and curve. Numbers would fit with severe restriction but her physiologic capability may be better than this. FVC 0.91/44%, and 10.74/45%, FEV1/FVC 0.81, FEF 25-75% 1.43/69%    . Depression    takes Zoloft daily  . Diabetes mellitus    Type II  . Diverticulosis    TCS 9/08 by Dr. Delfin Edis for diarrhea . Bx for micro scopic colitis negative.   . Fibromyalgia   .  Frequent falls   . GERD (gastroesophageal reflux disease)    takes Aciphex daily  . Glaucoma    eye drops daily  . Gum symptoms    infection on antibiotic  . Hemiplegia affecting non-dominant side, post-stroke 08/02/2011  . Hyperlipidemia    takes Crestor daily  . Hypertension    takes Amlodipine,Metoprolol,and Clonidine daily  . Hypothyroidism    takes Synthroid daily  . IBS (irritable bowel syndrome)   . Insomnia    takes Trazodone nightly  . Metabolic encephalopathy 6/83/4196  . Migraines    chronic headaches  . Mononeuritis lower limb   . Osteoporosis   . Pancreatitis 2006   due to Depakote with normal EUS   . Schatzki's ring    non critical / EGD with ED 8/2011with RMR  . Seizures ([place])    takes Lamictal daily.Last seizure 3 yrs ago  . Sleep apnea    on CPAP  . Stroke [facility])    left sided weakness  . Tubular adenoma of colon     Patient Active Problem List   Diagnosis Date Noted  . Finger pain, right 01/28/2017  . Hypotension   . Diabetic polyneuropathy associated with type 2 diabetes mellitus ([place]) 05/19/2016  . Smoker 04/24/2016  . History of palpitations 08/09/2015  . Nausea without vomiting 08/09/2015  . Labile hypertension 08/03/2015  . Normal  coronary arteries 08/03/2015  . Left-sided low back pain with left-sided sciatica 06/27/2015  . Left flank pain 06/27/2015  . Diabetes mellitus type 2 in obese ([place]) 05/06/2015  . Multinodular goiter 05/06/2015  . Rectocele, female 04/27/2015  . Anal sphincter incontinence 04/27/2015  . Pelvic relaxation due to rectocele 03/30/2015  . Pulmonary hypertension ([place]) 02/22/2015  . Light cigarette smoker (1-9 cigarettes per day) 01/11/2015  . Annual physical exam 01/05/2015  . Migraine without aura and without status migrainosus, not intractable 07/02/2014  . Flatulence 02/18/2014  . Microcytic anemia 02/18/2014  . COPD (chronic obstructive pulmonary disease) with chronic bronchitis ([place]) 09/16/2013  .  Hypothyroidism 08/16/2013  . Gastroparesis 04/28/2013  . Seizure disorder ([place]) 01/19/2013  . Cervical disc disorder with radiculopathy of cervical region 10/31/2012  . Solitary pulmonary nodule 08/19/2012  . Anemia 07/05/2012  . 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 Allergic sinusitis 04/18/2012  . Meningioma ([place]) 11/19/2011  . 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 Carpal tunnel syndrome of right wrist 05/23/2011  . Polypharmacy 04/28/2011  . Bipolar disorder ([place]) 04/28/2011  . Constipation 04/13/2011  . Falls frequently 12/12/2010  . Oropharyngeal dysphagia 07/12/2010  . Urinary incontinence 12/16/2009  . HEARING LOSS 10/26/2009  . Hyperlipidemia 12/11/2008  . IBS 12/11/2008  . GERD 07/29/2008  . MILK PRODUCTS ALLERGY 07/29/2008  . 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 Backache 06/19/2007  . Osteoporosis 06/19/2007  . Obstructive sleep apnea 06/19/2007  . TRIGGER FINGER 04/18/2007  . DIVERTICULOSIS, COLON 11/13/2006    Past Surgical History:  Procedure Laterality Date  . ABDOMINAL HYSTERECTOMY  1978  . BACK SURGERY  July 2012  . BACTERIAL OVERGROWTH TEST N/A 05/05/2013   Procedure: BACTERIAL OVERGROWTH TEST;  Surgeon: Daneil Dolin, MD;  [facility];  Service: Endoscopy;  Laterality: N/A;  7:30  . BIOPSY THYROID  2009  . BRAIN SURGERY  11/2011   resection of meningioma  . BREAST REDUCTION SURGERY  1994  . CARDIAC CATHETERIZATION  05/10/2005   normal coronaries, normal LV systolic function and EF (Dr. Jackie Plum)  . CARPAL TUNNEL RELEASE Left 07/22/04   Dr. Aline Brochure  . CATARACT EXTRACTION Bilateral   . CHOLECYSTECTOMY  1984  . COLONOSCOPY N/A 09/25/2012   OIZ:[id number] diverticulosis.  colonic polyp-removed : tubular adenoma  . CRANIOTOMY  11/23/2011   Procedure: CRANIOTOMY TUMOR EXCISION;  Surgeon: Hosie Spangle, MD;  Location: [facility];  Service: Neurosurgery;  Laterality: N/A;   Craniotomy for tumor resection  . ESOPHAGOGASTRODUODENOSCOPY  12/29/2010   R[place]k-Retained food in the esophagus and stomach, small hiatal hernia, status post Maloney dilation of the esophagus  . ESOPHAGOGASTRODUODENOSCOPY N/A 09/25/2012   XIP:[id number] atonic baggy esophagus status post Maloney dilation 60 F. Hiatal hernia  . GIVENS CAPSULE STUDY N/A 01/15/2013   NORMAL.   . IR GENERIC HISTORICAL  03/17/2016   IR RADIOLOGIST EVAL & MGMT 03/17/2016 [facility]-INTERV RAD  . LESION REMOVAL N/A 05/31/2015   Procedure: REMOVAL RIGHT AND LEFT LESIONS OF MANDIBLE;  Surgeon: Diona Browner, DDS;  Location: [place];  Service: Oral Surgery;  Laterality: N/A;  . MALONEY DILATION  12/29/2010   RMR;  . NM MYOCAR PERF WALL MOTION  2006   "relavtiely normal" persantine, mild anterior thinning (breast attenuation artifact), no region of scar/ischemia  . OVARIAN CYST REMOVAL    . RECTOCELE REPAIR N/A 06/29/2015   Procedure: POSTERIOR REPAIR (RECTOCELE);  Surgeon: Jonnie Kind, MD;  Location: [facility];  Service: Gynecology;  Laterality: N/A;  . REDUCTION MAMMAPLASTY Bilateral   . SPINE SURGERY  09/29/2010   Dr. Rolena Infante  . surgical excision of 3 tumors from right thigh and right buttock  and left upper thigh  2010  . TOOTH EXTRACTION Bilateral 12/14/2014   Procedure: REMOVAL OF BILATERAL MANDIBULAR EXOSTOSES;  Surgeon: Diona Browner, DDS;  Location: [place];  Service: Oral Surgery;  Laterality: Bilateral;  . TRANSTHORACIC ECHOCARDIOGRAM  2010   EF 60-65%, mild conc LVH, grade 1 diastolic dysfunction; mildly calcified MV annulus with mildly thickened leaflets, mildly calcified MR annulus    OB History    Gravida Para Term Preterm AB Living   6 1 1   5      SAB TAB Ectopic Multiple Live Births   5               Home Medications    Prior to Admission medications   Medication Sig Start Date End Date Taking? Authorizing Provider  albuterol (PROVENTIL HFA;VENTOLIN HFA) 108 (90 Base) MCG/ACT inhaler Inhale 1-2 puffs every 6  hours as needed for wheezing, shortness of breath 07/27/16  Yes Young, Tarri Fuller D, MD  aspirin EC 81 MG tablet Take 81 mg by mouth daily.   Yes [provider]  Cholecalciferol (VITAMIN D) 2000 units CAPS Take 1 capsule by mouth daily.   Yes [provider]  cloNIDine (CATAPRES) 0.3 MG tablet TAKE 1 TABLET BY MOUTH EVERY EIGHT HOURS. ONCE AT 8AM, 4PM, AND 12 MIDNIGHT. 01/23/17  Yes [name], MD  cyclobenzaprine (FLEXERIL) 5 MG tablet Take 1 tablet (5 mg total) by mouth 3 (three) times daily as needed. 12/16/16  Yes Rolland Porter, MD  diclofenac sodium (VOLTAREN) 1 % GEL Apply 2 g topically daily as needed (Pain). 07/21/16  Yes [name], MD  dicyclomine (BENTYL) 10 MG capsule Take 10 mg by mouth 4 (four) times daily -  before meals and at bedtime.   Yes [provider]  docusate sodium (COLACE) 100 MG capsule Take 200 mg by mouth at bedtime.   Yes [provider]  fluticasone (FLONASE) 50 MCG/ACT nasal spray Place 2 sprays into both nostrils daily.   Yes [provider]  HYDROcodone-acetaminophen (NORCO/VICODIN) 5-325 MG tablet Take 1 tablet by mouth 3 (three) times daily as needed for moderate pain.   Yes [provider]  hydroxychloroquine (PLAQUENIL) 200 MG tablet Take 200 mg by mouth 2 (two) times daily.    Yes [provider]  insulin aspart (NOVOLOG FLEXPEN) 100 UNIT/ML FlexPen INJECT 17 TO 24 UNITS INTO THE SKIN 3 (THREE) TIMES DAILY WITH MEALS. 11/06/16  Yes Philemon Kingdom, MD  Insulin Glargine (TOUJEO SOLOSTAR) 300 UNIT/ML SOPN Inject 30 Units into the skin at bedtime. 11/06/16  Yes Philemon Kingdom, MD  lamoTRIgine (LAMICTAL) 100 MG tablet Take 1 tablet (100 mg total) by mouth 2 (two) times daily. 03/19/17  Yes Kathrynn Ducking, MD  levothyroxine (SYNTHROID, LEVOTHROID) 50 MCG tablet TAKE 1 TABLET BY MOUTH ONCE DAILY AND 1/2 TABLET ON SUNDAYS. 01/23/17  Yes [name], MD  LORazepam (ATIVAN) 0.5 MG tablet  Take 1 tablet (0.5 mg total) by mouth 3 (three) times daily. 02/08/17 02/08/18 Yes Cloria Spring, MD  losartan (COZAAR) 50 MG tablet Take 1 tablet (50 mg total) daily by mouth. 01/23/17  Yes [name], MD  metoprolol tartrate (LOPRESSOR) 50 MG tablet TAKE 1 TABLET BY MOUTH TWICE DAILY. 03/09/17  Yes Hilty, Nadean Corwin, MD  montelukast (SINGULAIR) 10 MG tablet TAKE ONE TABLET BY MOUTH ONCE DAILY. 02/08/17  Yes [name], MD  Multiple Vitamins-Minerals (ONE-A-DAY 50 PLUS PO) Take 1 tablet by mouth daily.   Yes [provider]  nicotine (NICODERM CQ - DOSED IN MG/24 HOURS) 14 mg/24hr patch Place 1 patch (14 mg total) onto the skin daily. 03/01/17  Yes [name], MD  pregabalin (LYRICA) 75 MG capsule Take 1 capsule (75 mg total) by mouth daily. 10/20/16  Yes Rexene Alberts, MD  RABEprazole (ACIPHEX) 20 MG tablet TAKE 1 TABLET BY MOUTH TWICE DAILY. 12/01/16  Yes Pyrtle, Lajuan Lines, MD  RESTASIS 0.05 % ophthalmic emulsion Place 1 drop into both eyes 2 (two) times daily.  02/04/14  Yes [provider]  risperiDONE (RISPERDAL) 0.5 MG tablet Take 1 tablet (0.5 mg total) at bedtime by mouth. 01/24/17  Yes Cloria Spring, MD  rosuvastatin (CRESTOR) 5 MG tablet TAKE 1 TABLET BY MOUTH AT BEDTIME. 01/08/17  Yes [name], MD  sertraline (ZOLOFT) 100 MG tablet Take 1 tablet (100 mg total) daily by mouth. 01/24/17  Yes Cloria Spring, MD  traZODone (DESYREL) 100 MG tablet Take 1 tablet (100 mg total) at bedtime by mouth. 01/24/17  Yes Cloria Spring, MD  UNABLE TO [facility] bed mattress x 1  DX: G47.33, J44.9 01/23/17  Yes [name], MD    Family History Family History  Problem Relation Age of Onset  . Heart attack Mother        HTN  . Pneumonia Father   . Kidney failure Father   . Diabetes Father   . Pancreatic cancer Sister   . Diabetes Brother   . Hypertension Brother   . Diabetes Brother   . Cancer Sister        breast   .  Hypertension Son   . Sleep apnea Son   . Cancer Sister        pancreatic  . Stroke Maternal Grandmother   . Heart attack Maternal Grandfather   . Alcohol abuse Maternal Uncle   . Colon cancer Neg Hx   . Anesthesia problems Neg Hx   . Hypotension Neg Hx   . Malignant hyperthermia Neg Hx   . Pseudochol deficiency Neg Hx     Social History Social History   Tobacco Use  . Smoking status: Light Tobacco Smoker    Packs/day: 0.25    Years: 7.00    Pack years: 1.75    Types: Cigarettes  . Smokeless tobacco: Never Used  . Tobacco comment: continues to smoke 1/4 pack a day   Substance Use Topics  . Alcohol use: No    Alcohol/week: 0.0 oz    Comment:    . Drug use: No     Allergies   Cephalexin; Iron; Milk-related compounds; Penicillins; and Phenazopyridine hcl   Review of Systems Review of Systems  All other systems reviewed and are negative.    Physical Exam Updated Vital Signs BP (!) 147/67   Pulse 63   Temp 98.2 F (36.8 C) (Oral)   Resp 18   Ht 5\' 1"  (1.549 m)   Wt 70.8 kg (156 lb)   SpO2 97%   BMI 29.48 kg/m   Physical Exam  Constitutional: She appears well-developed and well-nourished.  HENT:  Head: Normocephalic and atraumatic.  Eyes: EOM are normal. Pupils are equal, round, and reactive to light.  Neck: Normal range of motion.  Cardiovascular: Normal rate and regular  rhythm.  Pulmonary/Chest: No stridor. No respiratory distress.  Abdominal: She exhibits no distension.  Neurological: She is alert.  Skin: Skin is warm and dry.  Nursing note and vitals reviewed.   ED Treatments / Results  Labs (all labs ordered are listed, but only abnormal results are displayed) Labs Reviewed  URINALYSIS, ROUTINE W REFLEX MICROSCOPIC - Abnormal; Notable for the following components:      Result Value   APPearance HAZY (*)    Protein, ur 30 (*)    Leukocytes, UA TRACE (*)    Bacteria, UA RARE (*)    Squamous Epithelial / LPF 6-30 (*)    All other components  within normal limits  I-STAT CHEM 8, ED - Abnormal; Notable for the following components:   BUN 23 (*)    Creatinine, Ser 1.40 (*)    Glucose, Bld 128 (*)    Hemoglobin 10.5 (*)    HCT 31.0 (*)    All other components within normal limits   Medications Ordered in ED Medications  sodium chloride 0.9 % bolus 1,000 mL (0 mLs Intravenous Stopped 03/31/17 1321)  metoCLOPramide (REGLAN) injection 10 mg (10 mg Intravenous Given 03/31/17 1223)  dexamethasone (DECADRON) injection 10 mg (10 mg Intravenous Given 03/31/17 1223)  diphenhydrAMINE (BENADRYL) injection 25 mg (25 mg Intravenous Given 03/31/17 1222)  sodium chloride 0.9 % bolus 1,000 mL (0 mLs Intravenous Stopped 03/31/17 1445)    Initial Impression / Assessment and Plan / ED Course  I have reviewed the triage vital signs and the nursing notes.  Pertinent labs & imaging results that were available during my care of the patient were reviewed by me and considered in my medical decision making (see chart for details).     Headache and dizziness or chronic could be exacerbated by her acute kidney injury here so a couple liters of fluid given and patient was able to ambulate without difficulty.  Headache improved.  Still had left sore throat and pain in her mouth but no obvious infection, lymphadenopathy, enlarged thyroid or other cause.  Will continue following up with her primary doctor to recheck kidney function and continue management.  Final Clinical Impressions(s) / ED Diagnoses   Final diagnoses:  Dizziness  Dehydration  Nonintractable headache, unspecified chronicity pattern, unspecified headache type  Sore throat  AKI (acute kidney injury) ([place])     Layci Stenglein, Corene Cornea, MD 03/31/17 1701

## 2017-04-02 ENCOUNTER — Other Ambulatory Visit: Payer: Self-pay | Admitting: Neurology

## 2017-04-02 ENCOUNTER — Other Ambulatory Visit: Payer: Self-pay | Admitting: Internal Medicine

## 2017-04-02 DIAGNOSIS — M150 Primary generalized (osteo)arthritis: Secondary | ICD-10-CM | POA: Diagnosis not present

## 2017-04-02 DIAGNOSIS — E119 Type 2 diabetes mellitus without complications: Secondary | ICD-10-CM | POA: Diagnosis not present

## 2017-04-02 DIAGNOSIS — R32 Unspecified urinary incontinence: Secondary | ICD-10-CM | POA: Diagnosis not present

## 2017-04-02 DIAGNOSIS — I639 Cerebral infarction, unspecified: Secondary | ICD-10-CM | POA: Diagnosis not present

## 2017-04-02 NOTE — Telephone Encounter (Signed)
Called and spoke with Pt. I asked her if she needed/requested the lamotrigine refill since Dr Jannifer Franklin sent it to her mail order 03/26/17-Pt said she hasn't rcvd it yet, advsd her I will ok refill from [place] and for her to make f/u appt. Pt stated she has been sick, but will call soon to schedule.

## 2017-04-02 NOTE — Telephone Encounter (Signed)
Patient was seen at the ER 1/19

## 2017-04-02 NOTE — Telephone Encounter (Signed)
This was sent to me after I was already gone on Friday. P

## 2017-04-03 ENCOUNTER — Encounter (HOSPITAL_COMMUNITY): Payer: Self-pay | Admitting: Emergency Medicine

## 2017-04-03 ENCOUNTER — Other Ambulatory Visit: Payer: Self-pay

## 2017-04-03 ENCOUNTER — Emergency Department (HOSPITAL_COMMUNITY): Payer: Medicare HMO

## 2017-04-03 ENCOUNTER — Emergency Department (HOSPITAL_COMMUNITY)
Admission: EM | Admit: 2017-04-03 | Discharge: 2017-04-03 | Disposition: A | Discharge status: Home / Self Care | Payer: Medicare HMO | Attending: Emergency Medicine | Admitting: Emergency Medicine

## 2017-04-03 DIAGNOSIS — R51 Headache: Secondary | ICD-10-CM | POA: Diagnosis not present

## 2017-04-03 DIAGNOSIS — E1140 Type 2 diabetes mellitus with diabetic neuropathy, unspecified: Secondary | ICD-10-CM | POA: Insufficient documentation

## 2017-04-03 DIAGNOSIS — Z79899 Other long term (current) drug therapy: Secondary | ICD-10-CM | POA: Insufficient documentation

## 2017-04-03 DIAGNOSIS — Z794 Long term (current) use of insulin: Secondary | ICD-10-CM | POA: Diagnosis not present

## 2017-04-03 DIAGNOSIS — G8929 Other chronic pain: Secondary | ICD-10-CM

## 2017-04-03 DIAGNOSIS — F17210 Nicotine dependence, cigarettes, uncomplicated: Secondary | ICD-10-CM | POA: Diagnosis not present

## 2017-04-03 DIAGNOSIS — I69359 Hemiplegia and hemiparesis following cerebral infarction affecting unspecified side: Secondary | ICD-10-CM | POA: Diagnosis not present

## 2017-04-03 DIAGNOSIS — Z7982 Long term (current) use of aspirin: Secondary | ICD-10-CM | POA: Diagnosis not present

## 2017-04-03 DIAGNOSIS — R531 Weakness: Secondary | ICD-10-CM | POA: Diagnosis not present

## 2017-04-03 DIAGNOSIS — G43919 Migraine, unspecified, intractable, without status migrainosus: Secondary | ICD-10-CM | POA: Diagnosis not present

## 2017-04-03 DIAGNOSIS — R42 Dizziness and giddiness: Secondary | ICD-10-CM | POA: Diagnosis not present

## 2017-04-03 MED ORDER — MECLIZINE HCL 25 MG PO TABS: 25.0000 mg | ORAL_TABLET | Freq: Three times a day (TID) | ORAL | 0 refills | Status: DC | PRN

## 2017-04-03 MED ORDER — TRAMADOL HCL 50 MG PO TABS: 50.0000 mg | ORAL_TABLET | Freq: Four times a day (QID) | ORAL | 0 refills | Status: DC | PRN

## 2017-04-03 NOTE — ED Triage Notes (Signed)
Pt reports continued headache and dizziness. Was seen in ED three times this month. Pt reports symptoms are worse since she was seen Saturday.

## 2017-04-03 NOTE — ED Provider Notes (Signed)
[facility] EMERGENCY DEPARTMENT Provider Note   CSN: [id number] Arrival date & time: 04/03/17  1016     History   Chief Complaint Chief Complaint  Patient presents with  . Headache    HPI [name] is a 67 y.o. female.  HPI Presents with left-sided headache and facial pain.  This been present for several weeks.  She has some dizziness associated with the headache.  Endorses photophobia.  Denies any neck pain.  She has ongoing left-sided weakness due to previous stroke.  She is been evaluated several times for similar symptoms.  Had a CT head this month without acute findings.  Denies any fever or chills. Past Medical History:  Diagnosis Date  . Anemia   . Anxiety    takes Ativan daily  . Arthritis   . Bipolar disorder ([place])    takes Risperdal nightly  . Blood transfusion   . Cancer ([place])    In her gum  . Carpal tunnel syndrome of right wrist 05/23/2011  . Cervical disc disorder with radiculopathy of cervical region 10/31/2012  . Chronic back pain   . Chronic idiopathic constipation   . Colon polyps   . COPD (chronic obstructive pulmonary disease) with chronic bronchitis ([place]) 09/16/2013   Office Spirometry 10/30/2013-submaximal effort based on appearance of loop and curve. Numbers would fit with severe restriction but her physiologic capability may be better than this. FVC 0.91/44%, and 10.74/45%, FEV1/FVC 0.81, FEF 25-75% 1.43/69%    . Depression    takes Zoloft daily  . Diabetes mellitus    Type II  . Diverticulosis    TCS 9/08 by Dr. Delfin Edis for diarrhea . Bx for micro scopic colitis negative.   . Fibromyalgia   . Frequent falls   . GERD (gastroesophageal reflux disease)    takes Aciphex daily  . Glaucoma    eye drops daily  . Gum symptoms    infection on antibiotic  . Hemiplegia affecting non-dominant side, post-stroke 08/02/2011  . Hyperlipidemia    takes Crestor daily  . Hypertension    takes Amlodipine,Metoprolol,and Clonidine daily  .  Hypothyroidism    takes Synthroid daily  . IBS (irritable bowel syndrome)   . Insomnia    takes Trazodone nightly  . Metabolic encephalopathy 6/37/8588  . Migraines    chronic headaches  . Mononeuritis lower limb   . Osteoporosis   . Pancreatitis 2006   due to Depakote with normal EUS   . Schatzki's ring    non critical / EGD with ED 8/2011with RMR  . Seizures ([place])    takes Lamictal daily.Last seizure 3 yrs ago  . Sleep apnea    on CPAP  . Stroke [facility])    left sided weakness  . Tubular adenoma of colon     Patient Active Problem List   Diagnosis Date Noted  . Finger pain, right 01/28/2017  . Hypotension   . Diabetic polyneuropathy associated with type 2 diabetes mellitus ([place]) 05/19/2016  . Smoker 04/24/2016  . History of palpitations 08/09/2015  . Nausea without vomiting 08/09/2015  . Labile hypertension 08/03/2015  . Normal coronary arteries 08/03/2015  . Left-sided low back pain with left-sided sciatica 06/27/2015  . Left flank pain 06/27/2015  . Diabetes mellitus type 2 in obese ([place]) 05/06/2015  . Multinodular goiter 05/06/2015  . Rectocele, female 04/27/2015  . Anal sphincter incontinence 04/27/2015  . Pelvic relaxation due to rectocele 03/30/2015  . Pulmonary hypertension ([place]) 02/22/2015  . Light cigarette smoker (1-9  cigarettes per day) 01/11/2015  . Annual physical exam 01/05/2015  . Migraine without aura and without status migrainosus, not intractable 07/02/2014  . Flatulence 02/18/2014  . Microcytic anemia 02/18/2014  . COPD (chronic obstructive pulmonary disease) with chronic bronchitis ([place]) 09/16/2013  . Hypothyroidism 08/16/2013  . Gastroparesis 04/28/2013  . Seizure disorder ([place]) 01/19/2013  . Cervical disc disorder with radiculopathy of cervical region 10/31/2012  . Solitary pulmonary nodule 08/19/2012  . Anemia 07/05/2012  . 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 Allergic sinusitis 04/18/2012  . Meningioma ([place])  11/19/2011  . 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 Carpal tunnel syndrome of right wrist 05/23/2011  . Polypharmacy 04/28/2011  . Bipolar disorder ([place]) 04/28/2011  . Constipation 04/13/2011  . Falls frequently 12/12/2010  . Oropharyngeal dysphagia 07/12/2010  . Urinary incontinence 12/16/2009  . HEARING LOSS 10/26/2009  . Hyperlipidemia 12/11/2008  . IBS 12/11/2008  . GERD 07/29/2008  . MILK PRODUCTS ALLERGY 07/29/2008  . 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 Backache 06/19/2007  . Osteoporosis 06/19/2007  . Obstructive sleep apnea 06/19/2007  . TRIGGER FINGER 04/18/2007  . DIVERTICULOSIS, COLON 11/13/2006    Past Surgical History:  Procedure Laterality Date  . ABDOMINAL HYSTERECTOMY  1978  . BACK SURGERY  July 2012  . BACTERIAL OVERGROWTH TEST N/A 05/05/2013   Procedure: BACTERIAL OVERGROWTH TEST;  Surgeon: Daneil Dolin, MD;  Location: [facility];  Service: Endoscopy;  Laterality: N/A;  7:30  . BIOPSY THYROID  2009  . BRAIN SURGERY  11/2011   resection of meningioma  . BREAST REDUCTION SURGERY  1994  . CARDIAC CATHETERIZATION  05/10/2005   normal coronaries, normal LV systolic function and EF (Dr. Jackie Plum)  . CARPAL TUNNEL RELEASE Left 07/22/04   Dr. Aline Brochure  . CATARACT EXTRACTION Bilateral   . CHOLECYSTECTOMY  1984  . COLONOSCOPY N/A 09/25/2012   EXH:BZJIRCV diverticulosis.  colonic polyp-removed : tubular adenoma  . CRANIOTOMY  11/23/2011   Procedure: CRANIOTOMY TUMOR EXCISION;  Surgeon: Hosie Spangle, MD;  Location: [place] NEURO ORS;  Service: Neurosurgery;  Laterality: N/A;  Craniotomy for tumor resection  . ESOPHAGOGASTRODUODENOSCOPY  12/29/2010   Rourk-Retained food in the esophagus and stomach, small hiatal hernia, status post Maloney dilation of the esophagus  . ESOPHAGOGASTRODUODENOSCOPY N/A 09/25/2012   ELF:YBOFBPZW atonic baggy esophagus status post Maloney dilation 36 F. Hiatal hernia  . GIVENS CAPSULE STUDY N/A 01/15/2013     NORMAL.   . IR GENERIC HISTORICAL  03/17/2016   IR RADIOLOGIST EVAL & MGMT 03/17/2016 [facility]-INTERV RAD  . LESION REMOVAL N/A 05/31/2015   Procedure: REMOVAL RIGHT AND LEFT LESIONS OF MANDIBLE;  Surgeon: Diona Browner, DDS;  Location: [place];  Service: Oral Surgery;  Laterality: N/A;  . MALONEY DILATION  12/29/2010   RMR;  . NM MYOCAR PERF WALL MOTION  2006   "relavtiely normal" persantine, mild anterior thinning (breast attenuation artifact), no region of scar/ischemia  . OVARIAN CYST REMOVAL    . RECTOCELE REPAIR N/A 06/29/2015   Procedure: POSTERIOR REPAIR (RECTOCELE);  Surgeon: Jonnie Kind, MD;  Location: [facility] ORS;  Service: Gynecology;  Laterality: N/A;  . REDUCTION MAMMAPLASTY Bilateral   . SPINE SURGERY  09/29/2010   Dr. Rolena Infante  . surgical excision of 3 tumors from right thigh and right buttock  and left upper thigh  2010  . TOOTH EXTRACTION Bilateral 12/14/2014   Procedure: REMOVAL OF BILATERAL MANDIBULAR EXOSTOSES;  Surgeon: Diona Browner, DDS;  [facility]  OR;  Service: Oral Surgery;  Laterality: Bilateral;  . TRANSTHORACIC ECHOCARDIOGRAM  2010   EF 60-65%, mild conc LVH, grade 1 diastolic dysfunction; mildly calcified MV annulus with mildly thickened leaflets, mildly calcified MR annulus    OB History    Gravida Para Term Preterm AB Living   6 1 1   5      SAB TAB Ectopic Multiple Live Births   5               Home Medications    Prior to Admission medications   Medication Sig Start Date End Date Taking? Authorizing Provider  albuterol (PROVENTIL HFA;VENTOLIN HFA) 108 (90 Base) MCG/ACT inhaler Inhale 1-2 puffs every 6 hours as needed for wheezing, shortness of breath 07/27/16   Baird Lyons D, MD  aspirin EC 81 MG tablet Take 81 mg by mouth daily.    [provider]  Cholecalciferol (VITAMIN D) 2000 units CAPS Take 1 capsule by mouth daily.    [provider]  cloNIDine (CATAPRES) 0.3 MG tablet TAKE 1 TABLET BY MOUTH EVERY EIGHT HOURS. ONCE AT 8AM, 4PM, AND  12 MIDNIGHT. 01/23/17   [name], MD  cyclobenzaprine (FLEXERIL) 5 MG tablet Take 1 tablet (5 mg total) by mouth 3 (three) times daily as needed. 12/16/16   Rolland Porter, MD  diclofenac sodium (VOLTAREN) 1 % GEL Apply 2 g topically daily as needed (Pain). 07/21/16   [name], MD  dicyclomine (BENTYL) 10 MG capsule Take 10 mg by mouth 4 (four) times daily -  before meals and at bedtime.    [provider]  docusate sodium (COLACE) 100 MG capsule Take 200 mg by mouth at bedtime.    [provider]  fluticasone (FLONASE) 50 MCG/ACT nasal spray Place 2 sprays into both nostrils daily.    [provider]  HYDROcodone-acetaminophen (NORCO/VICODIN) 5-325 MG tablet Take 1 tablet by mouth 3 (three) times daily as needed for moderate pain.    [provider]  hydroxychloroquine (PLAQUENIL) 200 MG tablet Take 200 mg by mouth 2 (two) times daily.     [provider]  insulin aspart (NOVOLOG FLEXPEN) 100 UNIT/ML FlexPen INJECT 17 TO 24 UNITS INTO THE SKIN 3 (THREE) TIMES DAILY WITH MEALS. 11/06/16   Philemon Kingdom, MD  Insulin Glargine (TOUJEO SOLOSTAR) 300 UNIT/ML SOPN Inject 30 Units into the skin at bedtime. 11/06/16   Philemon Kingdom, MD  lamoTRIgine (LAMICTAL) 100 MG tablet TAKE 1 TABLET BY MOUTH TWICE DAILY. 04/02/17   Pieter Partridge, DO  levothyroxine (SYNTHROID, LEVOTHROID) 50 MCG tablet TAKE 1 TABLET BY MOUTH ONCE DAILY AND 1/2 TABLET ON SUNDAYS. 01/23/17   [name], MD  LORazepam (ATIVAN) 0.5 MG tablet Take 1 tablet (0.5 mg total) by mouth 3 (three) times daily. 02/08/17 02/08/18  Cloria Spring, MD  losartan (COZAAR) 50 MG tablet Take 1 tablet (50 mg total) daily by mouth. 01/23/17   [name], MD  meclizine (ANTIVERT) 25 MG tablet Take 1 tablet (25 mg total) by mouth 3 (three) times daily as needed for dizziness. 04/03/17   Julianne Rice, MD  metoprolol tartrate (LOPRESSOR) 50 MG tablet TAKE 1 TABLET BY MOUTH TWICE  DAILY. 03/09/17   Hilty, Nadean Corwin, MD  montelukast (SINGULAIR) 10 MG tablet TAKE ONE TABLET BY MOUTH ONCE DAILY. 02/08/17   [name], MD  Multiple Vitamins-Minerals (ONE-A-DAY 50 PLUS PO) Take 1 tablet by mouth daily.    [provider]  nicotine (  NICODERM CQ - DOSED IN MG/24 HOURS) 14 mg/24hr patch Place 1 patch (14 mg total) onto the skin daily. 03/01/17   [name], MD  pregabalin (LYRICA) 75 MG capsule Take 1 capsule (75 mg total) by mouth daily. 10/20/16   Rexene Alberts, MD  RABEprazole (ACIPHEX) 20 MG tablet TAKE 1 TABLET BY MOUTH TWICE DAILY. 04/02/17   Pyrtle, Lajuan Lines, MD  RESTASIS 0.05 % ophthalmic emulsion Place 1 drop into both eyes 2 (two) times daily.  02/04/14   [provider]  risperiDONE (RISPERDAL) 0.5 MG tablet Take 1 tablet (0.5 mg total) at bedtime by mouth. 01/24/17   Cloria Spring, MD  rosuvastatin (CRESTOR) 5 MG tablet TAKE 1 TABLET BY MOUTH AT BEDTIME. 01/08/17   [name], MD  sertraline (ZOLOFT) 100 MG tablet Take 1 tablet (100 mg total) daily by mouth. 01/24/17   Cloria Spring, MD  traMADol (ULTRAM) 50 MG tablet Take 1 tablet (50 mg total) by mouth every 6 (six) hours as needed. 04/03/17   Julianne Rice, MD  traZODone (DESYREL) 100 MG tablet Take 1 tablet (100 mg total) at bedtime by mouth. 01/24/17   Cloria Spring, MD  UNABLE TO [facility] bed mattress x 1  DX: G47.33, J44.9 01/23/17   [name], MD    Family History Family History  Problem Relation Age of Onset  . Heart attack Mother        HTN  . Pneumonia Father   . Kidney failure Father   . Diabetes Father   . Pancreatic cancer Sister   . Diabetes Brother   . Hypertension Brother   . Diabetes Brother   . Cancer Sister        breast   . Hypertension Son   . Sleep apnea Son   . Cancer Sister        pancreatic  . Stroke Maternal Grandmother   . Heart attack Maternal Grandfather   . Alcohol abuse Maternal Uncle   . Colon cancer Neg Hx    . Anesthesia problems Neg Hx   . Hypotension Neg Hx   . Malignant hyperthermia Neg Hx   . Pseudochol deficiency Neg Hx     Social History Social History   Tobacco Use  . Smoking status: Light Tobacco Smoker    Packs/day: 0.25    Years: 7.00    Pack years: 1.75    Types: Cigarettes  . Smokeless tobacco: Never Used  . Tobacco comment: continues to smoke 1/4 pack a day   Substance Use Topics  . Alcohol use: No    Alcohol/week: 0.0 oz    Comment:    . Drug use: No     Allergies   Cephalexin; Iron; Milk-related compounds; Penicillins; and Phenazopyridine hcl   Review of Systems Review of Systems  Constitutional: Negative for chills and fever.  HENT: Positive for congestion, sinus pressure and sinus pain. Negative for facial swelling.   Eyes: Positive for photophobia. Negative for visual disturbance.  Respiratory: Negative for cough and shortness of breath.   Cardiovascular: Negative for chest pain.  Gastrointestinal: Negative for abdominal pain, diarrhea, nausea and vomiting.  Musculoskeletal: Negative for back pain and neck pain.  Skin: Negative for rash and wound.  Neurological: Positive for dizziness, weakness and headaches. Negative for facial asymmetry, speech difficulty, light-headedness and numbness.  All other systems reviewed and are negative.    Physical Exam Updated Vital Signs BP (!) 161/57 (BP Location: Right Arm)  Pulse 70   Temp 98.9 F (37.2 C) (Oral)   Resp 18   Ht 5\' 1"  (1.549 m)   Wt 70.8 kg (156 lb)   SpO2 100%   BMI 29.48 kg/m   Physical Exam  Constitutional: She is oriented to person, place, and time. She appears well-developed and well-nourished.  Non-toxic appearance. She does not appear ill. No distress.  HENT:  Head: Normocephalic and atraumatic.  Mouth/Throat: Oropharynx is clear and moist. No oropharyngeal exudate.  Bilateral nasal mucosal edema.  Patient has bilateral frontal sinus tenderness to percussion.  No temporal artery  tenderness to palpation.  No obvious scalp or facial trauma.  Eyes: EOM are normal. Pupils are equal, round, and reactive to light.  Few beats of fatigable horizontal nystagmus  Neck: Normal range of motion. Neck supple.  No posterior midline cervical tenderness to palpation.  Cardiovascular: Normal rate and regular rhythm. Exam reveals no gallop and no friction rub.  No murmur heard. Pulmonary/Chest: Effort normal and breath sounds normal. No stridor. No respiratory distress. She has no wheezes. She has no rales. She exhibits no tenderness.  Abdominal: Soft. Bowel sounds are normal. There is no tenderness. There is no rebound and no guarding.  Musculoskeletal: Normal range of motion. She exhibits no edema or tenderness.  Neurological: She is alert and oriented to person, place, and time.  4/5 motor in left upper and left lower extremities.  Patient has some ataxia with finger to nose testing of the left upper extremity.  Normal finger-nose testing of the right upper extremity.  5/5 motor in right upper and lower extremities.  Sensation intact.  Skin: Skin is warm and dry. Capillary refill takes less than 2 seconds. No rash noted. She is not diaphoretic. No erythema.  Psychiatric: She has a normal mood and affect. Her behavior is normal.  Nursing note and vitals reviewed.    ED Treatments / Results  Labs (all labs ordered are listed, but only abnormal results are displayed) Labs Reviewed - No data to display  EKG  EKG Interpretation None       Radiology Mr Brain Wo Contrast  Result Date: 04/03/2017 CLINICAL DATA:  Left-sided weakness for 1 day. Personal history of meningioma resection. EXAM: MRI HEAD WITHOUT CONTRAST TECHNIQUE: Multiplanar, multiecho pulse sequences of the brain and surrounding structures were obtained without intravenous contrast. COMPARISON:  CT head without contrast 03/26/17.  MRI brain 02/17/2016 FINDINGS: Brain: Bifrontal craniotomy again noted.  Diffusion-weighted images demonstrate no acute or subacute infarction. Bifrontal encephalomalacia is stable. Acute infarct, hemorrhage, new mass lesion is present. The residual planum sphenoidale meningioma better appreciated with contrast on the prior study. No significant mass effect is present. No significant extra-axial fluid collections are present. Moderate diffuse white matter changes are present bilaterally in the pons. Vascular: Normal flow voids. Skull and upper cervical spine: The craniocervical junction is normal. The visualized intracranial contents are unremarkable. Sinuses/Orbits: The paranasal sinuses and mastoid air cells are clear. Globes and orbits are within normal limits. Bilateral lens replacements are noted. IMPRESSION: 1. Stable appearance of bifrontal encephalomalacia following craniotomy for resection of meningioma. 2. Small residual or recurrent planum sphenoidale meningioma. 3. No acute intracranial abnormality. Electronically Signed   By: San Morelle M.D.   On: 04/03/2017 12:58    Procedures Procedures (including critical care time)  Medications Ordered in ED Medications - No data to display   Initial Impression / Assessment and Plan / ED Course  I have reviewed the triage vital signs and  the nursing notes.  Pertinent labs & imaging results that were available during my care of the patient were reviewed by me and considered in my medical decision making (see chart for details).     We will get an MRI to evaluate for acute or subacute stroke.  Patient likely also has sinus disease which may be contributing to her symptoms. MRI without acute findings.  Patient is already on Flonase and Singulair.  Will give meclizine as needed for dizziness.  Advised to follow-up with her neurologist.  Return precautions given.   Final Clinical Impressions(s) / ED Diagnoses   Final diagnoses:  Chronic intractable headache, unspecified headache type    ED Discharge Orders         Ordered    meclizine (ANTIVERT) 25 MG tablet  3 times daily PRN     04/03/17 1317    traMADol (ULTRAM) 50 MG tablet  Every 6 hours PRN     04/03/17 1317       Julianne Rice, MD 04/03/17 1318

## 2017-04-04 ENCOUNTER — Telehealth: Payer: Self-pay | Admitting: Family Medicine

## 2017-04-04 NOTE — Telephone Encounter (Signed)
I am having a hard time deciphering what the patient needs to be seen for, she was seen in the ER she says about 3 times since Friday. She is requesting several referrals, and says she needs an emergency appt with Dr.[name]. Can you please look over er notes and call patient. Let me know what I can do to help  Cb#: [phone]

## 2017-04-05 ENCOUNTER — Encounter (HOSPITAL_COMMUNITY): Payer: Self-pay

## 2017-04-05 ENCOUNTER — Telehealth (HOSPITAL_COMMUNITY): Payer: Self-pay | Admitting: *Deleted

## 2017-04-05 ENCOUNTER — Other Ambulatory Visit: Payer: Self-pay

## 2017-04-05 ENCOUNTER — Encounter (HOSPITAL_COMMUNITY): Payer: Self-pay | Admitting: Emergency Medicine

## 2017-04-05 ENCOUNTER — Ambulatory Visit (HOSPITAL_COMMUNITY): Payer: Medicare HMO | Admitting: Psychiatry

## 2017-04-05 ENCOUNTER — Emergency Department (HOSPITAL_COMMUNITY)
Admission: EM | Admit: 2017-04-05 | Discharge: 2017-04-05 | Disposition: A | Discharge status: Home / Self Care | Payer: Medicare HMO | Attending: Emergency Medicine | Admitting: Emergency Medicine

## 2017-04-05 DIAGNOSIS — R42 Dizziness and giddiness: Secondary | ICD-10-CM | POA: Insufficient documentation

## 2017-04-05 DIAGNOSIS — R51 Headache: Secondary | ICD-10-CM | POA: Insufficient documentation

## 2017-04-05 DIAGNOSIS — Z5321 Procedure and treatment not carried out due to patient leaving prior to being seen by health care provider: Secondary | ICD-10-CM | POA: Insufficient documentation

## 2017-04-05 NOTE — ED Notes (Signed)
Per registration pt decided to leave facility with out being seen.

## 2017-04-05 NOTE — Telephone Encounter (Signed)
Patient stopped by the office on her way to behavioral health and she was very weak and feeling bad. Had to take her to her appt in a wheelchair. Vitals were normal per nurse at psych and was advised by dr simpson to go to the ER if she is falling and so weak to be evaluated. Nurse aware at psych

## 2017-04-05 NOTE — Telephone Encounter (Signed)
Patient arrived to afternoon appointment with therapist Peggy B. Patient was assisted by 2 of [facility] Staff. Patient was found in the lobby after returning from  lunch @ [facility]. They brought patient to [facility] office. Patient is  Informed me when asked that she has been having headaches x 1 month & that today when her aid arrived her gait was bad that she falling & bumping into things. She state stated that she's light headed & everything is swimming. VS were taken : 117/67/57, R-16,  Sats- 99%. Shared information with Dr Moshe Cipro & nurse Theadora Rama . Dr Moshe Cipro will have patient admitted to [facility].

## 2017-04-05 NOTE — ED Triage Notes (Signed)
PT reports continued headache and dizziness with generalized weakness. PT reports multiple ED visits this month for same complaint and went to her PCP office today but couldn't get an appointment.

## 2017-04-06 NOTE — ED Notes (Signed)
04/06/2017. Called listed phone number, message left for a return call.

## 2017-04-09 ENCOUNTER — Ambulatory Visit (INDEPENDENT_AMBULATORY_CARE_PROVIDER_SITE_OTHER): Payer: Medicare HMO | Admitting: Otolaryngology

## 2017-04-09 DIAGNOSIS — R040 Epistaxis: Secondary | ICD-10-CM | POA: Diagnosis not present

## 2017-04-10 ENCOUNTER — Emergency Department (HOSPITAL_COMMUNITY)
Admission: EM | Admit: 2017-04-10 | Discharge: 2017-04-10 | Disposition: A | Discharge status: Home / Self Care | Payer: Medicare HMO | Attending: Emergency Medicine | Admitting: Emergency Medicine

## 2017-04-10 ENCOUNTER — Telehealth: Payer: Self-pay | Admitting: Family Medicine

## 2017-04-10 ENCOUNTER — Encounter (HOSPITAL_COMMUNITY): Payer: Self-pay | Admitting: Emergency Medicine

## 2017-04-10 ENCOUNTER — Emergency Department (HOSPITAL_COMMUNITY): Payer: Medicare HMO

## 2017-04-10 ENCOUNTER — Encounter: Payer: Self-pay | Admitting: Family Medicine

## 2017-04-10 ENCOUNTER — Other Ambulatory Visit: Payer: Self-pay | Admitting: *Deleted

## 2017-04-10 ENCOUNTER — Other Ambulatory Visit: Payer: Self-pay

## 2017-04-10 ENCOUNTER — Ambulatory Visit (INDEPENDENT_AMBULATORY_CARE_PROVIDER_SITE_OTHER): Payer: Medicare HMO | Admitting: Family Medicine

## 2017-04-10 VITALS — BP 200/80 | HR 95 | Temp 98.0°F | Resp 16 | Ht 61.0 in | Wt 155.2 lb

## 2017-04-10 DIAGNOSIS — Y9389 Activity, other specified: Secondary | ICD-10-CM | POA: Diagnosis not present

## 2017-04-10 DIAGNOSIS — I10 Essential (primary) hypertension: Secondary | ICD-10-CM

## 2017-04-10 DIAGNOSIS — Z794 Long term (current) use of insulin: Secondary | ICD-10-CM | POA: Insufficient documentation

## 2017-04-10 DIAGNOSIS — J449 Chronic obstructive pulmonary disease, unspecified: Secondary | ICD-10-CM | POA: Diagnosis not present

## 2017-04-10 DIAGNOSIS — Y92002 Bathroom of unspecified non-institutional (private) residence single-family (private) house as the place of occurrence of the external cause: Secondary | ICD-10-CM | POA: Diagnosis not present

## 2017-04-10 DIAGNOSIS — Z79899 Other long term (current) drug therapy: Secondary | ICD-10-CM | POA: Diagnosis not present

## 2017-04-10 DIAGNOSIS — T883XXD Malignant hyperthermia due to anesthesia, subsequent encounter: Secondary | ICD-10-CM | POA: Diagnosis not present

## 2017-04-10 DIAGNOSIS — E039 Hypothyroidism, unspecified: Secondary | ICD-10-CM | POA: Diagnosis not present

## 2017-04-10 DIAGNOSIS — R51 Headache: Secondary | ICD-10-CM

## 2017-04-10 DIAGNOSIS — Y999 Unspecified external cause status: Secondary | ICD-10-CM | POA: Diagnosis not present

## 2017-04-10 DIAGNOSIS — R519 Headache, unspecified: Secondary | ICD-10-CM

## 2017-04-10 DIAGNOSIS — Z87891 Personal history of nicotine dependence: Secondary | ICD-10-CM | POA: Diagnosis not present

## 2017-04-10 DIAGNOSIS — Z7982 Long term (current) use of aspirin: Secondary | ICD-10-CM | POA: Insufficient documentation

## 2017-04-10 DIAGNOSIS — R21 Rash and other nonspecific skin eruption: Secondary | ICD-10-CM

## 2017-04-10 DIAGNOSIS — E1142 Type 2 diabetes mellitus with diabetic polyneuropathy: Secondary | ICD-10-CM | POA: Diagnosis not present

## 2017-04-10 DIAGNOSIS — W2209XA Striking against other stationary object, initial encounter: Secondary | ICD-10-CM | POA: Diagnosis not present

## 2017-04-10 DIAGNOSIS — S0990XA Unspecified injury of head, initial encounter: Secondary | ICD-10-CM

## 2017-04-10 DIAGNOSIS — M79622 Pain in left upper arm: Secondary | ICD-10-CM | POA: Diagnosis not present

## 2017-04-10 DIAGNOSIS — F060 Psychotic disorder with hallucinations due to known physiological condition: Secondary | ICD-10-CM

## 2017-04-10 DIAGNOSIS — S199XXA Unspecified injury of neck, initial encounter: Secondary | ICD-10-CM | POA: Diagnosis not present

## 2017-04-10 LAB — CBC
HCT: 35 % — ABNORMAL LOW (ref 36.0–46.0)
HEMOGLOBIN: 10.1 g/dL — AB (ref 12.0–15.0)
MCH: 21.4 pg — ABNORMAL LOW (ref 26.0–34.0)
MCHC: 28.9 g/dL — AB (ref 30.0–36.0)
MCV: 74 fL — AB (ref 78.0–100.0)
Platelets: 271 10*3/uL (ref 150–400)
RBC: 4.73 MIL/uL (ref 3.87–5.11)
RDW: 13.7 % (ref 11.5–15.5)
WBC: 6.7 10*3/uL (ref 4.0–10.5)

## 2017-04-10 LAB — BASIC METABOLIC PANEL
ANION GAP: 11 (ref 5–15)
BUN: 15 mg/dL (ref 6–20)
CALCIUM: 9.8 mg/dL (ref 8.9–10.3)
CHLORIDE: 105 mmol/L (ref 101–111)
CO2: 25 mmol/L (ref 22–32)
Creatinine, Ser: 0.81 mg/dL (ref 0.44–1.00)
GFR calc Af Amer: >60 mL/min (ref >60)
GFR calc non Af Amer: >60 mL/min (ref >60)
GLUCOSE: 74 mg/dL (ref 65–99)
Potassium: 3.5 mmol/L (ref 3.5–5.1)
Sodium: 141 mmol/L (ref 135–145)

## 2017-04-10 LAB — URINALYSIS, ROUTINE W REFLEX MICROSCOPIC
Bilirubin Urine: NEGATIVE
GLUCOSE, UA: NEGATIVE mg/dL
HGB URINE DIPSTICK: NEGATIVE
Ketones, ur: NEGATIVE mg/dL
LEUKOCYTES UA: NEGATIVE
Nitrite: NEGATIVE
Protein, ur: NEGATIVE mg/dL
SPECIFIC GRAVITY, URINE: 1.006 (ref 1.005–1.030)
pH: 7 (ref 5.0–8.0)

## 2017-04-10 MED ORDER — HYDRALAZINE HCL 20 MG/ML IJ SOLN: 20.0000 mg | Freq: Once | INTRAMUSCULAR | Status: DC

## 2017-04-10 MED ORDER — CLONIDINE HCL 0.2 MG PO TABS
0.3000 mg | ORAL_TABLET | ORAL | Status: AC
  Administered 2017-04-10: 0.3 mg via ORAL
  Filled 2017-04-10: qty 1

## 2017-04-10 NOTE — Telephone Encounter (Signed)
Lenna Sciara is calling in regards to [name] and wants a referral sent back to continue Skilled Nursing, as they let the referral run out

## 2017-04-10 NOTE — ED Notes (Signed)
Gave EKG to Dr. Eulis Foster.

## 2017-04-10 NOTE — Telephone Encounter (Signed)
Melissa with [facility], is calli

## 2017-04-10 NOTE — ED Provider Notes (Signed)
[facility] EMERGENCY DEPARTMENT Provider Note   CSN: [id number] Arrival date & time: 04/10/17  1419     History   Chief Complaint Chief Complaint  Patient presents with  . Fall    HPI [name] is a 67 y.o. female.  HPI  The patient is a 67 year old female, she has been known to have a complicated history of chronic headaches for which she is seen by her family doctor, Dr. Tula Nakayama as well as a neurologist and a neurosurgeon, she states that she has had surgery by Dr. Sherwood Gambler in the past for a brain tumor which was noncancerous and is reported that she has a recurrent tumor.  Medical record review from her visit to the emergency department 1 week ago for headaches shows that she had stable findings of encephalomalacia in the frontal area after a craniotomy, there was also a small residual or recurrent meningioma.  The patient comes in from her family doctor's office where she was seen today because of a follow-up for her chronic headaches.  While she was in the office getting a urine sample she was in the bathroom, she felt acute onset of pain in her left arm while she was trying to use the bathroom and as she was trying to maneuver around in the bathroom she fell striking the back of her head against the wall.  She did require some help getting back to her feet but was able to ambulate with some difficulty.  She arrives by private vehicle with a caregiver with complaints of headache, right-sided neck pain.  She was also noted to be significantly hypertensive on arrival with a systolic blood pressure of 210.  She reports that she does take her daily medications including clonidine, losartan and metoprolol.  The patient denies seizures, visual changes, she has no numbness or weakness, she has no nausea or vomiting.  Past Medical History:  Diagnosis Date  . Anemia   . Anxiety    takes Ativan daily  . Arthritis   . Bipolar disorder ([place])    takes Risperdal nightly  .  Blood transfusion   . Cancer ([place])    In her gum  . Carpal tunnel syndrome of right wrist 05/23/2011  . Cervical disc disorder with radiculopathy of cervical region 10/31/2012  . Chronic back pain   . Chronic idiopathic constipation   . Colon polyps   . COPD (chronic obstructive pulmonary disease) with chronic bronchitis ([place]) 09/16/2013   Office Spirometry 10/30/2013-submaximal effort based on appearance of loop and curve. Numbers would fit with severe restriction but her physiologic capability may be better than this. FVC 0.91/44%, and 10.74/45%, FEV1/FVC 0.81, FEF 25-75% 1.43/69%    . Depression    takes Zoloft daily  . Diabetes mellitus    Type II  . Diverticulosis    TCS 9/08 by Dr. Delfin Edis for diarrhea . Bx for micro scopic colitis negative.   . Fibromyalgia   . Frequent falls   . GERD (gastroesophageal reflux disease)    takes Aciphex daily  . Glaucoma    eye drops daily  . Gum symptoms    infection on antibiotic  . Hemiplegia affecting non-dominant side, post-stroke 08/02/2011  . Hyperlipidemia    takes Crestor daily  . Hypertension    takes Amlodipine,Metoprolol,and Clonidine daily  . Hypothyroidism    takes Synthroid daily  . IBS (irritable bowel syndrome)   . Insomnia    takes Trazodone nightly  . Metabolic encephalopathy 5/36/6440  .  Migraines    chronic headaches  . Mononeuritis lower limb   . Osteoporosis   . Pancreatitis 2006   due to Depakote with normal EUS   . Schatzki's ring    non critical / EGD with ED 8/2011with RMR  . Seizures ([place])    takes Lamictal daily.Last seizure 3 yrs ago  . Sleep apnea    on CPAP  . Stroke [facility])    left sided weakness  . Tubular adenoma of colon     Patient Active Problem List   Diagnosis Date Noted  . Finger pain, right 01/28/2017  . Hypotension   . Diabetic polyneuropathy associated with type 2 diabetes mellitus ([place]) 05/19/2016  . Smoker 04/24/2016  . History of palpitations 08/09/2015  . Nausea without  vomiting 08/09/2015  . Labile hypertension 08/03/2015  . Normal coronary arteries 08/03/2015  . Left-sided low back pain with left-sided sciatica 06/27/2015  . Left flank pain 06/27/2015  . Diabetes mellitus type 2 in obese ([place]) 05/06/2015  . Multinodular goiter 05/06/2015  . Rectocele, female 04/27/2015  . Anal sphincter incontinence 04/27/2015  . Pelvic relaxation due to rectocele 03/30/2015  . Pulmonary hypertension ([place]) 02/22/2015  . Light cigarette smoker (1-9 cigarettes per day) 01/11/2015  . Annual physical exam 01/05/2015  . Migraine without aura and without status migrainosus, not intractable 07/02/2014  . Flatulence 02/18/2014  . Microcytic anemia 02/18/2014  . COPD (chronic obstructive pulmonary disease) with chronic bronchitis ([place]) 09/16/2013  . Hypothyroidism 08/16/2013  . Gastroparesis 04/28/2013  . Seizure disorder ([place]) 01/19/2013  . Cervical disc disorder with radiculopathy of cervical region 10/31/2012  . Solitary pulmonary nodule 08/19/2012  . Anemia 07/05/2012  . 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 Allergic sinusitis 04/18/2012  . Meningioma ([place]) 11/19/2011  . 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 Carpal tunnel syndrome of right wrist 05/23/2011  . Polypharmacy 04/28/2011  . Bipolar disorder ([place]) 04/28/2011  . Constipation 04/13/2011  . Falls frequently 12/12/2010  . Oropharyngeal dysphagia 07/12/2010  . Urinary incontinence 12/16/2009  . HEARING LOSS 10/26/2009  . Hyperlipidemia 12/11/2008  . IBS 12/11/2008  . GERD 07/29/2008  . MILK PRODUCTS ALLERGY 07/29/2008  . 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 Backache 06/19/2007  . Osteoporosis 06/19/2007  . Obstructive sleep apnea 06/19/2007  . TRIGGER FINGER 04/18/2007  . DIVERTICULOSIS, COLON 11/13/2006    Past Surgical History:  Procedure Laterality Date  . ABDOMINAL HYSTERECTOMY  1978  . BACK SURGERY  July 2012  . BACTERIAL  OVERGROWTH TEST N/A 05/05/2013   Procedure: BACTERIAL OVERGROWTH TEST;  Surgeon: Daneil Dolin, MD;  [facility];  Service: Endoscopy;  Laterality: N/A;  7:30  . BIOPSY THYROID  2009  . BRAIN SURGERY  11/2011   resection of meningioma  . BREAST REDUCTION SURGERY  1994  . CARDIAC CATHETERIZATION  05/10/2005   normal coronaries, normal LV systolic function and EF (Dr. Jackie Plum)  . CARPAL TUNNEL RELEASE Left 07/22/04   Dr. Aline Brochure  . CATARACT EXTRACTION Bilateral   . CHOLECYSTECTOMY  1984  . COLONOSCOPY N/A 09/25/2012   FUX:NATFTDD diverticulosis.  colonic polyp-removed : tubular adenoma  . CRANIOTOMY  11/23/2011   Procedure: CRANIOTOMY TUMOR EXCISION;  Surgeon: Hosie Spangle, MD;  Location: [facility] NEURO ORS;  Service: Neurosurgery;  Laterality: N/A;  Craniotomy for tumor resection  . ESOPHAGOGASTRODUODENOSCOPY  12/29/2010   [place]k-Retained food in the esophagus and stomach, small hiatal hernia, status post Maloney dilation of the  esophagus  . ESOPHAGOGASTRODUODENOSCOPY N/A 09/25/2012   XTG:GYIRSWNI atonic baggy esophagus status post Maloney dilation 40 F. Hiatal hernia  . GIVENS CAPSULE STUDY N/A 01/15/2013   NORMAL.   . IR GENERIC HISTORICAL  03/17/2016   IR RADIOLOGIST EVAL & MGMT 03/17/2016 [facility]-INTERV RAD  . LESION REMOVAL N/A 05/31/2015   Procedure: REMOVAL RIGHT AND LEFT LESIONS OF MANDIBLE;  Surgeon: Diona Browner, DDS;  Location: [facility];  Service: Oral Surgery;  Laterality: N/A;  . MALONEY DILATION  12/29/2010   RMR;  . NM MYOCAR PERF WALL MOTION  2006   "relavtiely normal" persantine, mild anterior thinning (breast attenuation artifact), no region of scar/ischemia  . OVARIAN CYST REMOVAL    . RECTOCELE REPAIR N/A 06/29/2015   Procedure: POSTERIOR REPAIR (RECTOCELE);  Surgeon: Jonnie Kind, MD;  Location: [facility] ORS;  Service: Gynecology;  Laterality: N/A;  . REDUCTION MAMMAPLASTY Bilateral   . SPINE SURGERY  09/29/2010   Dr. Rolena Infante  . surgical excision of 3 tumors from right  thigh and right buttock  and left upper thigh  2010  . TOOTH EXTRACTION Bilateral 12/14/2014   Procedure: REMOVAL OF BILATERAL MANDIBULAR EXOSTOSES;  Surgeon: Diona Browner, DDS;  Location: [facility];  Service: Oral Surgery;  Laterality: Bilateral;  . TRANSTHORACIC ECHOCARDIOGRAM  2010   EF 60-65%, mild conc LVH, grade 1 diastolic dysfunction; mildly calcified MV annulus with mildly thickened leaflets, mildly calcified MR annulus    OB History    Gravida Para Term Preterm AB Living   6 1 1   5      SAB TAB Ectopic Multiple Live Births   5               Home Medications    Prior to Admission medications   Medication Sig Start Date End Date Taking? Authorizing Provider  acetaminophen (TYLENOL) 500 MG tablet Take 500 mg by mouth every 4 (four) hours.   Yes [provider]  albuterol (PROVENTIL HFA;VENTOLIN HFA) 108 (90 Base) MCG/ACT inhaler Inhale 1-2 puffs every 6 hours as needed for wheezing, shortness of breath 07/27/16  Yes Young, Tarri Fuller D, MD  aspirin EC 81 MG tablet Take 81 mg by mouth daily.   Yes [provider]  Cholecalciferol (VITAMIN D) 2000 units CAPS Take 1 capsule by mouth daily.   Yes [provider]  cloNIDine (CATAPRES) 0.3 MG tablet TAKE 1 TABLET BY MOUTH EVERY EIGHT HOURS. ONCE AT 8AM, 4PM, AND 12 MIDNIGHT. 01/23/17  Yes [name], MD  diclofenac sodium (VOLTAREN) 1 % GEL Apply 2 g topically daily as needed (Pain). 07/21/16  Yes [name], MD  fluticasone (FLONASE) 50 MCG/ACT nasal spray Place 2 sprays into both nostrils daily.   Yes [provider]  HYDROcodone-acetaminophen (NORCO/VICODIN) 5-325 MG tablet Take 1 tablet by mouth 3 (three) times daily as needed for moderate pain.   Yes [provider]  hydroxychloroquine (PLAQUENIL) 200 MG tablet Take 200 mg by mouth 2 (two) times daily.    Yes [provider]  insulin aspart (NOVOLOG FLEXPEN) 100 UNIT/ML FlexPen INJECT 17 TO 24 UNITS INTO THE SKIN 3  (THREE) TIMES DAILY WITH MEALS. 11/06/16  Yes Philemon Kingdom, MD  Insulin Glargine (TOUJEO SOLOSTAR) 300 UNIT/ML SOPN Inject 30 Units into the skin at bedtime. 11/06/16  Yes Philemon Kingdom, MD  levothyroxine (SYNTHROID, LEVOTHROID) 50 MCG tablet TAKE 1 TABLET BY MOUTH ONCE DAILY AND 1/2 TABLET ON SUNDAYS. 01/23/17  Yes [name], MD  LORazepam (ATIVAN) 0.5  MG tablet Take 1 tablet (0.5 mg total) by mouth 3 (three) times daily. 02/08/17 02/08/18 Yes Cloria Spring, MD  losartan (COZAAR) 50 MG tablet Take 1 tablet (50 mg total) daily by mouth. 01/23/17  Yes [name], MD  meclizine (ANTIVERT) 25 MG tablet Take 1 tablet (25 mg total) by mouth 3 (three) times daily as needed for dizziness. 04/03/17  Yes Julianne Rice, MD  metoprolol tartrate (LOPRESSOR) 50 MG tablet TAKE 1 TABLET BY MOUTH TWICE DAILY. 03/09/17  Yes Hilty, Nadean Corwin, MD  Multiple Vitamins-Minerals (ONE-A-DAY 50 PLUS PO) Take 1 tablet by mouth daily.   Yes [provider]  nicotine (NICODERM CQ - DOSED IN MG/24 HOURS) 14 mg/24hr patch Place 1 patch (14 mg total) onto the skin daily. 03/01/17  Yes [name], MD  pregabalin (LYRICA) 75 MG capsule Take 1 capsule (75 mg total) by mouth daily. 10/20/16  Yes Rexene Alberts, MD  RABEprazole (ACIPHEX) 20 MG tablet TAKE 1 TABLET BY MOUTH TWICE DAILY. 04/02/17  Yes Pyrtle, Lajuan Lines, MD  RESTASIS 0.05 % ophthalmic emulsion Place 1 drop into both eyes 2 (two) times daily.  02/04/14  Yes [provider]  risperiDONE (RISPERDAL) 0.5 MG tablet Take 1 tablet (0.5 mg total) at bedtime by mouth. 01/24/17  Yes Cloria Spring, MD  rosuvastatin (CRESTOR) 5 MG tablet TAKE 1 TABLET BY MOUTH AT BEDTIME. 01/08/17  Yes [name], MD  sertraline (ZOLOFT) 100 MG tablet Take 1 tablet (100 mg total) daily by mouth. 01/24/17  Yes Cloria Spring, MD  tiZANidine (ZANAFLEX) 4 MG tablet Take 4 mg by mouth 2 (two) times daily as needed for muscle spasms.   Yes  [provider]  traMADol (ULTRAM) 50 MG tablet Take 1 tablet (50 mg total) by mouth every 6 (six) hours as needed. 04/03/17  Yes Julianne Rice, MD  traZODone (DESYREL) 100 MG tablet Take 1 tablet (100 mg total) at bedtime by mouth. 01/24/17  Yes Cloria Spring, MD  lamoTRIgine (LAMICTAL) 100 MG tablet TAKE 1 TABLET BY MOUTH TWICE DAILY. Patient not taking: Reported on 04/10/2017 04/02/17   Metta Clines R, DO  montelukast (SINGULAIR) 10 MG tablet TAKE ONE TABLET BY MOUTH ONCE DAILY. Patient not taking: Reported on 04/10/2017 02/08/17   [name], MD  UNABLE TO [facility] bed mattress x 1  DX: G47.33, J44.9 01/23/17   [name], MD    Family History Family History  Problem Relation Age of Onset  . Heart attack Mother        HTN  . Pneumonia Father   . Kidney failure Father   . Diabetes Father   . Pancreatic cancer Sister   . Diabetes Brother   . Hypertension Brother   . Diabetes Brother   . Cancer Sister        breast   . Hypertension Son   . Sleep apnea Son   . Cancer Sister        pancreatic  . Stroke Maternal Grandmother   . Heart attack Maternal Grandfather   . Alcohol abuse Maternal Uncle   . Colon cancer Neg Hx   . Anesthesia problems Neg Hx   . Hypotension Neg Hx   . Malignant hyperthermia Neg Hx   . Pseudochol deficiency Neg Hx     Social History Social History   Tobacco Use  . Smoking status: Former Smoker    Packs/day: 0.25    Years: 7.00  Pack years: 1.75    Types: Cigarettes    Last attempt to quit: 03/13/2017    Years since quitting: 0.0  . Smokeless tobacco: Never Used  . Tobacco comment: continues to smoke 1/4 pack a day   Substance Use Topics  . Alcohol use: No    Alcohol/week: 0.0 oz    Comment:    . Drug use: No     Allergies   Cephalexin; Iron; Milk-related compounds; Penicillins; and Phenazopyridine hcl   Review of Systems Review of Systems  All other systems reviewed and are negative.    Physical  Exam Updated Vital Signs BP (!) 124/54   Pulse 63   Temp 99.2 F (37.3 C) (Oral)   Resp 20   Ht 5\' 1"  (1.549 m)   Wt 70.3 kg (155 lb)   SpO2 99%   BMI 29.29 kg/m   Physical Exam  Constitutional: She appears well-developed and well-nourished. No distress.  HENT:  Head: Normocephalic and atraumatic.  Mouth/Throat: Oropharynx is clear and moist. No oropharyngeal exudate.  Palpation of the scalp shows that she has tenderness in the posterior occiput near the crown of the head, no obvious hematoma, no break in the skin  Eyes: Conjunctivae and EOM are normal. Pupils are equal, round, and reactive to light. Right eye exhibits no discharge. Left eye exhibits no discharge. No scleral icterus.  Neck: Normal range of motion. Neck supple. No JVD present. No thyromegaly present.  Very supple neck, no tenderness of the posterior spine though she does have some right-sided neck tenderness  Cardiovascular: Normal rate, regular rhythm, normal heart sounds and intact distal pulses. Exam reveals no gallop and no friction rub.  No murmur heard. Pulmonary/Chest: Effort normal and breath sounds normal. No respiratory distress. She has no wheezes. She has no rales.  Abdominal: Soft. Bowel sounds are normal. She exhibits no distension and no mass. There is no tenderness.  Musculoskeletal: Normal range of motion. She exhibits no edema or tenderness.  The patient has some tenderness to palpation over her left humerus in the mid to distal humerus, normal range of motion at the shoulder and the elbow without pain, no joint effusions, no redness of the skin.  Lymphadenopathy:    She has no cervical adenopathy.  Neurological: She is alert. Coordination normal.  The patient is able to speak and articulate clearly what happened, she has good memory, she is alert and follows commands without difficulty though she appears generally weak she moves all 4 extremities with equal strength, normal sensation diffusely  Skin:  Skin is warm and dry. No rash noted. No erythema.  No breaks in the skin  Psychiatric: She has a normal mood and affect. Her behavior is normal.  Nursing note and vitals reviewed.    ED Treatments / Results  Labs (all labs ordered are listed, but only abnormal results are displayed) Labs Reviewed  URINALYSIS, ROUTINE W REFLEX MICROSCOPIC - Abnormal; Notable for the following components:      Result Value   Color, Urine STRAW (*)    All other components within normal limits  CBC - Abnormal; Notable for the following components:   Hemoglobin 10.1 (*)    HCT 35.0 (*)    MCV 74.0 (*)    MCH 21.4 (*)    MCHC 28.9 (*)    All other components within normal limits  BASIC METABOLIC PANEL    EKG  EKG Interpretation  Date/Time:  Tuesday April 10 2017 16:07:14 EST Ventricular Rate:  64 PR Interval:    QRS Duration: 92 QT Interval:  408 QTC Calculation: 421 R Axis:   30 Text Interpretation:  Sinus rhythm Abnormal R-wave progression, early transition since last tracing no significant change Confirmed by Daleen Bo [phone]) on 04/10/2017 4:43:10 PM       Radiology Ct Head Wo Contrast  Result Date: 04/10/2017 CLINICAL DATA:  Fall in bathroom, hit head. EXAM: CT HEAD WITHOUT CONTRAST CT CERVICAL SPINE WITHOUT CONTRAST TECHNIQUE: Multidetector CT imaging of the head and cervical spine was performed following the standard protocol without intravenous contrast. Multiplanar CT image reconstructions of the cervical spine were also generated. COMPARISON:  03/26/2017 CT.  MRI 04/03/2017. FINDINGS: CT HEAD FINDINGS Brain: Encephalomalacia in the frontal lobes bilaterally following craniotomy for prior meningioma resection. No acute infarct, hemorrhage or hydrocephalus. Vascular: No hyperdense vessel or unexpected calcification. Skull: Prior frontal craniotomy.  No acute calvarial abnormality. Sinuses/Orbits: Visualized paranasal sinuses and mastoids clear. Orbital soft tissues unremarkable.  Other: No free fluid or free air. CT CERVICAL SPINE FINDINGS Alignment: Motion degraded study.  Normal alignment. Skull base and vertebrae: No fracture Soft tissues and spinal canal: Prevertebral soft tissues are normal. No epidural or paraspinal hematoma. Disc levels: Degenerative disc disease changes C5-6 through C7-T1. Mild bilateral degenerative facet disease. Upper chest: Negative Other: No acute findings. IMPRESSION: Bifrontal encephalomalacia following prior craniotomy and surgical resection of prior meningioma. No change. No acute intracranial abnormality. No acute bony abnormality in the cervical spine. Electronically Signed   By: Rolm Baptise M.D.   On: 04/10/2017 16:47   Ct Cervical Spine Wo Contrast  Result Date: 04/10/2017 CLINICAL DATA:  Fall in bathroom, hit head. EXAM: CT HEAD WITHOUT CONTRAST CT CERVICAL SPINE WITHOUT CONTRAST TECHNIQUE: Multidetector CT imaging of the head and cervical spine was performed following the standard protocol without intravenous contrast. Multiplanar CT image reconstructions of the cervical spine were also generated. COMPARISON:  03/26/2017 CT.  MRI 04/03/2017. FINDINGS: CT HEAD FINDINGS Brain: Encephalomalacia in the frontal lobes bilaterally following craniotomy for prior meningioma resection. No acute infarct, hemorrhage or hydrocephalus. Vascular: No hyperdense vessel or unexpected calcification. Skull: Prior frontal craniotomy.  No acute calvarial abnormality. Sinuses/Orbits: Visualized paranasal sinuses and mastoids clear. Orbital soft tissues unremarkable. Other: No free fluid or free air. CT CERVICAL SPINE FINDINGS Alignment: Motion degraded study.  Normal alignment. Skull base and vertebrae: No fracture Soft tissues and spinal canal: Prevertebral soft tissues are normal. No epidural or paraspinal hematoma. Disc levels: Degenerative disc disease changes C5-6 through C7-T1. Mild bilateral degenerative facet disease. Upper chest: Negative Other: No acute  findings. IMPRESSION: Bifrontal encephalomalacia following prior craniotomy and surgical resection of prior meningioma. No change. No acute intracranial abnormality. No acute bony abnormality in the cervical spine. Electronically Signed   By: Rolm Baptise M.D.   On: 04/10/2017 16:47   Dg Humerus Left  Result Date: 04/10/2017 CLINICAL DATA:  Status post fall with pain of the left humerus. EXAM: LEFT HUMERUS - 2+ VIEW COMPARISON:  None. FINDINGS: There is no evidence of fracture or dislocation. Soft tissues are unremarkable. IMPRESSION: Negative. Electronically Signed   By: Abelardo Diesel M.D.   On: 04/10/2017 16:54    Procedures Procedures (including critical care time)  Medications Ordered in ED Medications  cloNIDine (CATAPRES) tablet 0.3 mg (0.3 mg Oral Given 04/10/17 1602)     Initial Impression / Assessment and Plan / ED Course  I have reviewed the triage vital signs and the nursing notes.  Pertinent labs & imaging  results that were available during my care of the patient were reviewed by me and considered in my medical decision making (see chart for details).  Clinical Course as of Apr 11 1827  Tue Apr 10, 2017  1805 Labs reviewed, normal metabolic panel, the patient has chronic anemia, no difference today, urinalysis is negative  [BM]  1805 Imaging of the humerus interpreted by myself, no signs of fracture or abnormal bony structures, CT scan of the head and cervical spine is read by the radiologist without any acute findings either.  [BM]    Clinical Course User Index [BM] Noemi Chapel, MD   The cause of the patient's fall seems to be mechanical after suffering a pain in her left arm which she states she has been struggling with for several weeks.  That was part of the reason she was at the office for an evaluation by her family doctor.  I will obtain an x-ray of the humerus to rule out any bony abnormalities though it does not appear to have any signs of zoster, cellulitis or DVT.   CT scan of the brain will be ordered to rule out intracranial injury, thankfully she has had a recent MRI showing no signal tumor burden that would be causing increasing pain.  She has follow-up with both neurology and neurosurgery and family doctor.  The patient was informed of all of her results, she appears stable for discharge, blood pressure has normalized with medications given including clonidine 0.3 mg  Final Clinical Impressions(s) / ED Diagnoses   Final diagnoses:  Bad headache  Minor head injury, initial encounter  Essential hypertension    ED Discharge Orders    None       Noemi Chapel, MD 04/10/17 1829

## 2017-04-10 NOTE — ED Triage Notes (Signed)
Patient states she fell in the bathroom at her PCP office and hit her head. No LOC. Patient c/o pain in her right arm and head.

## 2017-04-10 NOTE — Patient Instructions (Addendum)
You need to go to the ED for evaluation of uncontrolled blood pressure and because you continue to report weakness and unsteadiness since you fell tooday in the office and hit your head and you report urinary symptoms for the past 1 week  I will also refer you to Dr Jerrell Belfast regarding rsh on forehead x 1 day  You are beiong referred to cardiology about your blood pressure also

## 2017-04-10 NOTE — ED Notes (Signed)
Have called pt.'s son to come pick up patient

## 2017-04-10 NOTE — Discharge Instructions (Signed)
Your testing today revealed no abnormal brain findings, no fractures of your spine or your skull and your left arm also appeared normal.  Please continue to follow-up with your family doctor and your neurologist for further evaluation and treatment of your chronic headaches.  Make sure that you take your blood pressure medication exactly as prescribed by your doctor's

## 2017-04-10 NOTE — Patient Outreach (Signed)
[facility]) Care Management  04/10/2017  [name] 18-Feb-1951 [id number]   Telephone Screen  Referral Date:  04/06/2017 Referral Source:  [facility] ED Census Reason for Referral:  6 or more ED visits in the past 6 months Insurance:  [employer]   Outreach Attempt:  Outreach attempt #1 to patient for telephone screening. No answer. RN Health Coach left HIPAA compliant voicemail message along with contact information.  Plan:  RN Health Coach will make outreach attempt to patient for telephone screening within three business days.   [place] [phone] Carolie [name]

## 2017-04-10 NOTE — ED Notes (Signed)
Pt back from CT

## 2017-04-10 NOTE — Telephone Encounter (Signed)
Please advise 

## 2017-04-10 NOTE — Progress Notes (Signed)
   [name]     MRN: [id number]      DOB: Nov 03, 1950   HPI [name] is here for follow up and re-evaluation of chronic medical conditions, Last week she stopped by the office en route to behav health because she stated she just did not feel good, legs were  Like rubber, she had headache , tremor in left hand, she was using her walker and felt as though she would fall. At behavioral health she  Was advised not seen at was advised to  Go to the ED, she states that after waiting until 8 pm without being seen she went home States that in the last 2 weeks, she has been experiencing increased instability with vertigo, needing assistance in the shower. Today when she came in to the visit c/o urinary urgency and frequency  Foul odor , when she went to bathroom she states when she tried to hold on she fell and hit the back of her head, states the headache currently is no different from her ususal headache Itchy rash on forehead wants to see Dr Jerrell Belfast x 2 days  ROS Denies recent fever or chills. Denies sinus pressure, nasal congestion, ear pain or sore throat. Denies chest congestion, productive cough or wheezing. Denies chest pains, palpitations and leg swelling Denies abdominal pain, nausea, vomiting,diarrhea or constipation.    C/o chronic  joint pain,  and limitation in mobility. . C/o chronic  depression, anxiety or insomnia.  PE  BP (!) 190/70   Pulse 95   Temp 98 F (36.7 C)   Resp 16   Ht 5\' 1"  (1.549 m)   Wt 155 lb 4 oz (70.4 kg)   SpO2 97%   BMI 29.33 kg/m   Patient alert and oriented markedly elevated blood pressure HEENT: No facial asymmetry, EOMI,   oropharynx pink and moist.  Neck supple no JVD, no mass.  Chest: Clear to auscultation bilaterally.  CVS: S1, S2 no murmurs, no S3.Regular rate.  ABD: Soft non tender.   Ext: No edema  MS: Adequate ROM spine, shoulders, hips and knees. Unsteady gait, needs assistance for safe ambulation  Skin: Intact, macular rash  on right side of forehead, hyperpigmented, not erythematous or warm, no drainage No swelling or skin breakdown noted in area of head trauma  Psych: Good eye contact, normal affect. Memory intact not anxious or depressed appearing.  CNS: CN 2-12 intact, power,  normal throughout.no focal deficits noted.   Assessment & Plan Malignant hyperpyrexia Uncontrolled with unsteady gait and recent fall in office bathroom resulting in posterior head trauma. Although there are no obvious neurologic deficits and no external trauma, she  needs evaluation and further management in the ED of both her BP and the head trauma Direct contact made with th ED physician  Acute head trauma Trauma to head in  Bathroom during OV , pt fell in bathroom , directed to the E for further evaluation. No obvious injury and clinically stable and neurologically intact at the time of transfer, case discussed with ED physician, she was taken by her sitter who was called to the office , request also be made that she be  accompanied future visits as she is extremely unsteady  Rash and nonspecific skin eruption Rash on frehead , right side near hairline, not pruritic, present for several weeks, pt requests dermatology eval , will refer

## 2017-04-11 ENCOUNTER — Other Ambulatory Visit: Payer: Self-pay | Admitting: *Deleted

## 2017-04-11 NOTE — Patient Outreach (Signed)
[facility]) Care Management  04/11/2017  [name] April 30, 1950 [id number]   Telephone Screen  Referral Date:  04/06/2017 Referral Source:  [facility] ED Census Reason for Referral:  6 or more ED visits in the past 6 months Insurance:  [employer]   Outreach Attempt:  Outreach attempt #2 to patient for telephone screening. No answer. RN Health Coach unable to leave voice message due to voicemail not available.  Plan:  RN Health Coach will attempt another outreach for screening within the next 3 business days.  [place] Coach [phone] [email or web address]

## 2017-04-11 NOTE — Telephone Encounter (Signed)
Done

## 2017-04-11 NOTE — Telephone Encounter (Signed)
pls refer for skilled nursing services for medication supervision and  Weekly checks on blood pressure and weight

## 2017-04-11 NOTE — Patient Outreach (Addendum)
[facility]) Care Management  04/11/2017  [name] Sep 08, 1950 [id number]   Telephone Screen  Referral Date:04/06/2017 Referral Source:[facility] ED Census Reason for Referral:6 or more ED visits in the past 6 months Insurance:[name]   Outreach Attempt:   Successful telephone outreach to patient for telephone screening.  HIPAA verified with patient.  Patient completed telephone screening.  Social:  Patient lives at home alone.  Reports needing assistance with ADLs and dependent on assistance for her IADLs.  Patient states she has a home health aide through the agency [facility].  The aide is there 7 days a week, 6 hours a day Monday through Friday and 2 hours a day on Saturday and Sunday.  Patient states she receives medical transportation services through [facility] to get to her medical appointments.  DME in the home include: front wheel rolling walker, straight cane, blood pressure cuff, CBG meter, eyeglasses, dentures, shower chair with back, wheelchair, and grab bars.  Patient reports about 3 falls in the past year.  Just recently had a fall yesterday at the doctors office.  Reports only ambulating with the walker sometimes.  Encouraged patient to utilize the walker at all times to help prevent falls.  Falls precautions reviewed and discussed with patient.  Conditions:   Per chart review and discussion with patient, PMH include:  Uncontrolled hypertension, frequent falls, anemia, anxiety, arthritis, bipolar disorder, chronic back pain, cervical surgery, constipation,COPD, depression, diabetes, fibromyalgia, GERD, glaucoma, hyperlipidemia, hypothyroidism, migraines, stroke, osteoporosis, seizures, meningioma with craniotomy, and diverticulosis.  Reports monitoring her blood sugars about 4 times a day.  CBG this morning 147.  Last Hgb A1C was 6 on 02/05/2017.  Patient has complained about severe headache, left arm pain, and increasing weakness over the last several months  which has led to multiple emergency room visits.  Increasing weakness has also led to multiple falls or near falls.  Patient follow Neurology for migraines.  Last Neurology appointment they discussed treatment options patient is reviewing.  On emergency room and primary care visits patient has also had elevated blood pressures that resolve after medications in the emergency room.  Patient reporting continued headache, left arm pain, and neck pain.  States pain medication does not seem to be helping.  Patient encouraged to speak with her primary care provider and neurologist concerning continued pain.  Patient seems to be some sedated, falling asleep or drifting in the middle of conversation during the telephone screening.  Medications:  Patient reports taking about 27 medications.  Reports compliance with medications and denies any trouble affording medications.  Patient does state she has a home health nurse from [facility] that comes every 2 weeks to fill her pill boxes with her medications.  Appointments:  Patient last saw her primary care provider, [name] on yesterday, 04/10/2017 who sent her to the emergency room after fall in office and elevated blood pressure.  Next appointment with [name] is 05/24/2017.  Patient saw Dr. Jannifer Franklin with Neurology on 03/19/2017 and follow up appointment is 09/17/2017.  Next appointment with [name], Endocrinology is 06/07/2017.  Advanced Directives:  Patient reports losing her Advance Directive she was in the process of creating.  Requesting an Emergency planning/management officer.   Consent:  [facility] services reviewed and discussed with patient.  Patient verbally agrees to [facility] services.  Plan: RN Health Coach will notify [facility] administrative assistant of case status. RN Health Coach will send successful outreach letter to patient. RN Health Coach will send patient Emergency planning/management officer  RN Health Coach will send referral to [facility] RN for further in home safety  eval/assessment of care needs and management of chronic conditions. RN Health Coach will send [place] referral for medication reconciliation.  [place] [phone] Missi [email or web address]

## 2017-04-11 NOTE — Addendum Note (Signed)
Addended by: Merceda Elks on: 04/11/2017 01:45 PM   Modules accepted: Orders

## 2017-04-12 ENCOUNTER — Telehealth: Payer: Self-pay | Admitting: Family Medicine

## 2017-04-12 ENCOUNTER — Ambulatory Visit: Payer: Self-pay | Admitting: *Deleted

## 2017-04-12 NOTE — Telephone Encounter (Signed)
Did u mean to send a message because the note is empty

## 2017-04-13 ENCOUNTER — Other Ambulatory Visit: Payer: Self-pay | Admitting: *Deleted

## 2017-04-13 DIAGNOSIS — J449 Chronic obstructive pulmonary disease, unspecified: Secondary | ICD-10-CM | POA: Diagnosis not present

## 2017-04-13 DIAGNOSIS — I10 Essential (primary) hypertension: Secondary | ICD-10-CM | POA: Diagnosis not present

## 2017-04-13 NOTE — Telephone Encounter (Signed)
Sorry, I canceled my message because I was going to ask her why she needed dermatology referral. Patient is requesting a referral to [facility] (she didn't state why) and Dr.Brooks at [facility] orthopedic for back pain. She also wants to know when Dr.[name] wants to seee her back in the office.

## 2017-04-13 NOTE — Patient Outreach (Signed)
Telephone call to pt to schedule initial home visit, spoke with pt, HIPAA verified, pt states she has " lots of appointments",  RN CM scheduled visit to accommodate patient's schedule for week of 04/30/17.  PLAN See pt for scheduled initial home visit this month  Jacqlyn Larsen [facility], Trainer Coordinator [phone]

## 2017-04-19 DIAGNOSIS — R296 Repeated falls: Secondary | ICD-10-CM | POA: Diagnosis not present

## 2017-04-19 DIAGNOSIS — E1142 Type 2 diabetes mellitus with diabetic polyneuropathy: Secondary | ICD-10-CM | POA: Diagnosis not present

## 2017-04-19 DIAGNOSIS — F250 Schizoaffective disorder, bipolar type: Secondary | ICD-10-CM | POA: Diagnosis not present

## 2017-04-19 DIAGNOSIS — I69354 Hemiplegia and hemiparesis following cerebral infarction affecting left non-dominant side: Secondary | ICD-10-CM | POA: Diagnosis not present

## 2017-04-19 DIAGNOSIS — J449 Chronic obstructive pulmonary disease, unspecified: Secondary | ICD-10-CM | POA: Diagnosis not present

## 2017-04-19 DIAGNOSIS — Z794 Long term (current) use of insulin: Secondary | ICD-10-CM | POA: Diagnosis not present

## 2017-04-19 DIAGNOSIS — Z87891 Personal history of nicotine dependence: Secondary | ICD-10-CM | POA: Diagnosis not present

## 2017-04-19 DIAGNOSIS — I509 Heart failure, unspecified: Secondary | ICD-10-CM | POA: Diagnosis not present

## 2017-04-19 DIAGNOSIS — I110 Hypertensive heart disease with heart failure: Secondary | ICD-10-CM | POA: Diagnosis not present

## 2017-04-23 DIAGNOSIS — G894 Chronic pain syndrome: Secondary | ICD-10-CM | POA: Diagnosis not present

## 2017-04-23 DIAGNOSIS — M25512 Pain in left shoulder: Secondary | ICD-10-CM | POA: Diagnosis not present

## 2017-04-23 DIAGNOSIS — M25519 Pain in unspecified shoulder: Secondary | ICD-10-CM | POA: Diagnosis not present

## 2017-04-23 DIAGNOSIS — M62830 Muscle spasm of back: Secondary | ICD-10-CM | POA: Diagnosis not present

## 2017-04-25 ENCOUNTER — Encounter: Payer: Self-pay | Admitting: Family Medicine

## 2017-04-25 ENCOUNTER — Other Ambulatory Visit: Payer: Self-pay | Admitting: Pharmacist

## 2017-04-25 DIAGNOSIS — R296 Repeated falls: Secondary | ICD-10-CM | POA: Diagnosis not present

## 2017-04-25 DIAGNOSIS — T883XXA Malignant hyperthermia due to anesthesia, initial encounter: Secondary | ICD-10-CM

## 2017-04-25 DIAGNOSIS — E1142 Type 2 diabetes mellitus with diabetic polyneuropathy: Secondary | ICD-10-CM | POA: Diagnosis not present

## 2017-04-25 DIAGNOSIS — R21 Rash and other nonspecific skin eruption: Secondary | ICD-10-CM | POA: Insufficient documentation

## 2017-04-25 DIAGNOSIS — I110 Hypertensive heart disease with heart failure: Secondary | ICD-10-CM | POA: Diagnosis not present

## 2017-04-25 DIAGNOSIS — I509 Heart failure, unspecified: Secondary | ICD-10-CM | POA: Diagnosis not present

## 2017-04-25 DIAGNOSIS — Z794 Long term (current) use of insulin: Secondary | ICD-10-CM | POA: Diagnosis not present

## 2017-04-25 DIAGNOSIS — F250 Schizoaffective disorder, bipolar type: Secondary | ICD-10-CM | POA: Diagnosis not present

## 2017-04-25 DIAGNOSIS — J449 Chronic obstructive pulmonary disease, unspecified: Secondary | ICD-10-CM | POA: Diagnosis not present

## 2017-04-25 DIAGNOSIS — Z87891 Personal history of nicotine dependence: Secondary | ICD-10-CM | POA: Diagnosis not present

## 2017-04-25 DIAGNOSIS — I69354 Hemiplegia and hemiparesis following cerebral infarction affecting left non-dominant side: Secondary | ICD-10-CM | POA: Diagnosis not present

## 2017-04-25 HISTORY — DX: Malignant hyperthermia due to anesthesia, initial encounter: T88.3XXA

## 2017-04-25 NOTE — Assessment & Plan Note (Signed)
Rash on frehead , right side near hairline, not pruritic, present for several weeks, pt requests dermatology eval , will refer

## 2017-04-25 NOTE — Assessment & Plan Note (Signed)
Trauma to head in  Bathroom during OV , pt fell in bathroom , directed to the E for further evaluation. No obvious injury and clinically stable and neurologically intact at the time of transfer, case discussed with ED physician, she was taken by her sitter who was called to the office , request also be made that she be  accompanied future visits as she is extremely unsteady

## 2017-04-25 NOTE — Assessment & Plan Note (Signed)
Uncontrolled with unsteady gait and recent fall in office bathroom resulting in posterior head trauma. Although there are no obvious neurologic deficits and no external trauma, she  needs evaluation and further management in the ED of both her BP and the head trauma Direct contact made with th ED physician

## 2017-04-25 NOTE — Patient Outreach (Signed)
[facility]) Care Management  04/25/2017  [name] February 16, 1951 [id number]  Receive a voicemail from patient returning my call. Call back and speak with patient. HIPAA identifiers verified and verbal consent received.  Patient is interested in having a medication review. Patient would like home visit for medication review.  Patient also asks to confirm upcoming home visit appointment that she has with [facility] RNCM Jacqlyn Larsen. Confirm with patient that her home visit with Almyra Free is for 04/30/17 at 11:30 am.  PLAN  [facility] CM Pharmacist to call to schedule a home visit with patient.  Harlow Asa, PharmD, [place] Management [phone]

## 2017-04-25 NOTE — Patient Outreach (Signed)
[facility]) Care Management  04/25/2017  Cheral Cappucci Granato 1950/07/05 [id number]   Rebbeca Paul Lasalle was referred to pharmacy for medication review. Left a HIPAA compliant message on the patient's voicemail. If have not heard from patient by 04/27/17, will give her another call at that time.  Harlow Asa, PharmD, [place] Management [phone]

## 2017-04-26 ENCOUNTER — Ambulatory Visit (HOSPITAL_COMMUNITY): Payer: Self-pay | Admitting: Psychiatry

## 2017-04-26 DIAGNOSIS — J449 Chronic obstructive pulmonary disease, unspecified: Secondary | ICD-10-CM | POA: Diagnosis not present

## 2017-04-26 DIAGNOSIS — E1142 Type 2 diabetes mellitus with diabetic polyneuropathy: Secondary | ICD-10-CM | POA: Diagnosis not present

## 2017-04-26 DIAGNOSIS — I110 Hypertensive heart disease with heart failure: Secondary | ICD-10-CM | POA: Diagnosis not present

## 2017-04-26 DIAGNOSIS — R296 Repeated falls: Secondary | ICD-10-CM | POA: Diagnosis not present

## 2017-04-26 DIAGNOSIS — I69354 Hemiplegia and hemiparesis following cerebral infarction affecting left non-dominant side: Secondary | ICD-10-CM | POA: Diagnosis not present

## 2017-04-26 DIAGNOSIS — I509 Heart failure, unspecified: Secondary | ICD-10-CM | POA: Diagnosis not present

## 2017-04-26 DIAGNOSIS — F250 Schizoaffective disorder, bipolar type: Secondary | ICD-10-CM | POA: Diagnosis not present

## 2017-04-26 DIAGNOSIS — Z794 Long term (current) use of insulin: Secondary | ICD-10-CM | POA: Diagnosis not present

## 2017-04-26 DIAGNOSIS — Z87891 Personal history of nicotine dependence: Secondary | ICD-10-CM | POA: Diagnosis not present

## 2017-04-27 ENCOUNTER — Ambulatory Visit: Payer: Self-pay | Admitting: Pharmacist

## 2017-04-30 ENCOUNTER — Encounter: Payer: Self-pay | Admitting: *Deleted

## 2017-04-30 ENCOUNTER — Telehealth: Payer: Self-pay | Admitting: Pharmacist

## 2017-04-30 ENCOUNTER — Telehealth: Payer: Self-pay | Admitting: Family Medicine

## 2017-04-30 ENCOUNTER — Other Ambulatory Visit: Payer: Self-pay | Admitting: *Deleted

## 2017-04-30 ENCOUNTER — Encounter (HOSPITAL_COMMUNITY): Payer: Self-pay | Admitting: Psychiatry

## 2017-04-30 ENCOUNTER — Ambulatory Visit (INDEPENDENT_AMBULATORY_CARE_PROVIDER_SITE_OTHER): Payer: Medicare HMO | Admitting: Psychiatry

## 2017-04-30 VITALS — BP 178/80 | HR 66 | Ht 61.0 in | Wt 155.0 lb

## 2017-04-30 DIAGNOSIS — Z736 Limitation of activities due to disability: Secondary | ICD-10-CM

## 2017-04-30 DIAGNOSIS — M2550 Pain in unspecified joint: Secondary | ICD-10-CM | POA: Diagnosis not present

## 2017-04-30 DIAGNOSIS — Z91410 Personal history of adult physical and sexual abuse: Secondary | ICD-10-CM

## 2017-04-30 DIAGNOSIS — Z62811 Personal history of psychological abuse in childhood: Secondary | ICD-10-CM

## 2017-04-30 DIAGNOSIS — F331 Major depressive disorder, recurrent, moderate: Secondary | ICD-10-CM | POA: Diagnosis not present

## 2017-04-30 DIAGNOSIS — M542 Cervicalgia: Secondary | ICD-10-CM

## 2017-04-30 DIAGNOSIS — Z87891 Personal history of nicotine dependence: Secondary | ICD-10-CM

## 2017-04-30 DIAGNOSIS — Z811 Family history of alcohol abuse and dependence: Secondary | ICD-10-CM | POA: Diagnosis not present

## 2017-04-30 DIAGNOSIS — Z9181 History of falling: Secondary | ICD-10-CM | POA: Diagnosis not present

## 2017-04-30 MED ORDER — SERTRALINE HCL 100 MG PO TABS: 100.0000 mg | ORAL_TABLET | Freq: Every day | ORAL | 2 refills | Status: DC

## 2017-04-30 MED ORDER — TRAZODONE HCL 100 MG PO TABS: 100.0000 mg | ORAL_TABLET | Freq: Every day | ORAL | 2 refills | Status: DC

## 2017-04-30 MED ORDER — LORAZEPAM 0.5 MG PO TABS: 0.5000 mg | ORAL_TABLET | Freq: Three times a day (TID) | ORAL | 2 refills | Status: DC

## 2017-04-30 MED ORDER — RISPERIDONE 0.5 MG PO TABS: 0.5000 mg | ORAL_TABLET | Freq: Every day | ORAL | 2 refills | Status: DC

## 2017-04-30 NOTE — Patient Outreach (Signed)
[facility]) Care Management   04/30/2017  Neenah Canter Streeper 03/10/1951 [id number]  Alecea Trego Marinaccio is an 67 y.o. female  Subjective: Initial home visit with pt, HIPAA verified, states home health RN sees her q 2 weeks and prefills medication box and this is an ongoing service, PT will be starting to work with her today for left shoulder pain and balance and strength training.  Pt has [facility] aide Monday-Friday 6 hours daily and 2 hours day Saturday and Sunday, pt rides [place] and her aide assists some with transportation.  Pt reports "blood sugar is good usually in low 100's range"  Pt states her biggest problem is pain in back, legs, shoulder and she attends pain management clinic, and other problem is frequent falls, pt states "they just changed my pain medication thinking this was making me dizzy"    Objective:   Vitals:   04/30/17 1225  BP: 138/62  Pulse: 60  Resp: 18  SpO2: 97%  Weight: 156 lb (70.8 kg)  Height: 1.549 m (5\' 1" )  CBG fasting today 134 7 day average 126 14 day average 140 30 day average 146 90 day average 158  ROS  Physical Exam  Constitutional: She is oriented to person, place, and time. She appears well-developed and well-nourished.  HENT:  Head: Normocephalic.  Neck: Normal range of motion. Neck supple.  Cardiovascular: Normal rate and regular rhythm.  Respiratory: Effort normal and breath sounds normal.  Pt reports she smokes approximately 3 cigarettes daily  GI: Soft. Bowel sounds are normal.  Musculoskeletal: Normal range of motion. She exhibits no edema.  Neurological: She is alert and oriented to person, place, and time.  Skin: Skin is warm and dry.  Psychiatric: She has a normal mood and affect. Her behavior is normal. Thought content normal.    Encounter Medications:   Outpatient Encounter Medications as of 04/30/2017  Medication Sig Note  . acetaminophen (TYLENOL) 500 MG tablet Take 500 mg by mouth every 4 (four) hours.   [name] albuterol  (PROVENTIL HFA;VENTOLIN HFA) 108 (90 Base) MCG/ACT inhaler Inhale 1-2 puffs every 6 hours as needed for wheezing, shortness of breath   . aspirin EC 81 MG tablet Take 81 mg by mouth daily.   . Cholecalciferol (VITAMIN D) 2000 units CAPS Take 1 capsule by mouth daily.   . cloNIDine (CATAPRES) 0.3 MG tablet TAKE 1 TABLET BY MOUTH EVERY EIGHT HOURS. ONCE AT 8AM, 4PM, AND 12 MIDNIGHT.   [name] diclofenac sodium (VOLTAREN) 1 % GEL Apply 2 g topically daily as needed (Pain).   . fluticasone (FLONASE) 50 MCG/ACT nasal spray Place 2 sprays into both nostrils daily.   . hydroxychloroquine (PLAQUENIL) 200 MG tablet Take 200 mg by mouth daily.    . insulin aspart (NOVOLOG FLEXPEN) 100 UNIT/ML FlexPen INJECT 17 TO 24 UNITS INTO THE SKIN 3 (THREE) TIMES DAILY WITH MEALS. 04/10/2017: 146-took 14 units  . Insulin Glargine (TOUJEO SOLOSTAR) 300 UNIT/ML SOPN Inject 30 Units into the skin at bedtime.   . lamoTRIgine (LAMICTAL) 100 MG tablet TAKE 1 TABLET BY MOUTH TWICE DAILY.   [name] levothyroxine (SYNTHROID, LEVOTHROID) 50 MCG tablet TAKE 1 TABLET BY MOUTH ONCE DAILY AND 1/2 TABLET ON SUNDAYS.   [name] LORazepam (ATIVAN) 0.5 MG tablet Take 1 tablet (0.5 mg total) by mouth 3 (three) times daily.   [name] losartan (COZAAR) 50 MG tablet Take 1 tablet (50 mg total) daily by mouth.   . metoprolol tartrate (LOPRESSOR) 50 MG tablet TAKE 1  TABLET BY MOUTH TWICE DAILY.   . montelukast (SINGULAIR) 10 MG tablet TAKE ONE TABLET BY MOUTH ONCE DAILY.   . Multiple Vitamins-Minerals (ONE-A-DAY 50 PLUS PO) Take 1 tablet by mouth daily.   . nicotine (NICODERM CQ - DOSED IN MG/24 HOURS) 14 mg/24hr patch Place 1 patch (14 mg total) onto the skin daily.   . pregabalin (LYRICA) 75 MG capsule Take 1 capsule (75 mg total) by mouth daily.   . RABEprazole (ACIPHEX) 20 MG tablet TAKE 1 TABLET BY MOUTH TWICE DAILY.   [name] RESTASIS 0.05 % ophthalmic emulsion Place 1 drop into both eyes 2 (two) times daily.    . risperiDONE (RISPERDAL) 0.5 MG tablet Take 1 tablet  (0.5 mg total) at bedtime by mouth.   . rosuvastatin (CRESTOR) 5 MG tablet TAKE 1 TABLET BY MOUTH AT BEDTIME.   [name] sertraline (ZOLOFT) 100 MG tablet Take 1 tablet (100 mg total) daily by mouth.   [name] tiZANidine (ZANAFLEX) 4 MG tablet Take 4 mg by mouth 2 (two) times daily as needed for muscle spasms.   . traMADol (ULTRAM) 50 MG tablet Take 1 tablet (50 mg total) by mouth every 6 (six) hours as needed.   . traZODone (DESYREL) 100 MG tablet Take 1 tablet (100 mg total) at bedtime by mouth. (Patient taking differently: Take 200 mg by mouth at bedtime. )   . HYDROcodone-acetaminophen (NORCO/VICODIN) 5-325 MG tablet Take 1 tablet by mouth 3 (three) times daily as needed for moderate pain.   . meclizine (ANTIVERT) 25 MG tablet Take 1 tablet (25 mg total) by mouth 3 (three) times daily as needed for dizziness. (Patient not taking: Reported on 04/30/2017)   . UNABLE TO [facility] bed mattress x 1  DX: G47.33, J44.9 (Patient not taking: Reported on 04/30/2017)    Facility-Administered Encounter Medications as of 04/30/2017  Medication  . acetaminophen (TYLENOL) tablet 325 mg    Functional Status:   In your present state of health, do you have any difficulty performing the following activities: 04/30/2017 12/06/2016  Hearing? N N  Vision? N N  Difficulty concentrating or making decisions? N N  Walking or climbing stairs? Y Y  Dressing or bathing? Y Y  Doing errands, shopping? Tempie Donning  Preparing Food and eating ? N Y  Using the Toilet? N N  In the past six months, have you accidently leaked urine? Y Y  Do you have problems with loss of bowel control? N N  Managing your Medications? Y N  Managing your Finances? Tempie Donning  Housekeeping or managing your Housekeeping? Y Y  Some recent data might be hidden    Fall/Depression Screening:    Fall Risk  04/30/2017 04/11/2017 12/06/2016  Falls in the past year? Yes Yes Yes  Number falls in past yr: 2 or more 2 or more 2 or more  Injury with Fall? No No No  Comment -  - -  Risk Factor Category  High Fall Risk High Fall Risk High Fall Risk  Risk for fall due to : History of fall(s);Impaired balance/gait;Medication side effect;Impaired mobility History of fall(s);Impaired balance/gait;Impaired mobility;Impaired vision;Medication side effect History of fall(s);Impaired balance/gait;Impaired mobility  Follow up Education provided;Falls evaluation completed;Falls prevention discussed Falls prevention discussed;Education provided Falls evaluation completed;Education provided;Falls prevention discussed   PHQ 2/9 Scores 04/30/2017 04/11/2017 12/06/2016 09/25/2016 04/21/2015 03/19/2015 02/10/2015  PHQ - 2 Score 1 1 2 2 1 2  0  PHQ- 9 Score - - 5 9 - 9 -    Assessment:  RN CM reviewed medications with pt, pt declines assistance with smoking cessation, pt has advanced directives packet but pt has to leave near end of visit as [facility] Lucianne Lei is waiting outside so unable to complete, RN CM ask pt to discuss with her family who she would like as HCPOA and second choice and RN CM can assist to complete at next visit if pt desires.  RN CM faxed initial home visit and barrier letter to primary MD Dr. Nevada Crane.  [facility] CM Care Plan Problem One     Most Recent Value  Care Plan Problem One  Pt high risk for falls  Role Documenting the Problem One  Care Management [place] for Problem One  Active  [facility] Long Term Goal   Pt will have no falls and demonstrate safety precautions within 60 days  [facility] Long Term Goal Start Date  04/30/17  Interventions for Problem One Long Term Goal  RN CM gave pt 24 hour nurse advice line, reviewed importance of using walker, cane, pt has LIfe Alert necklace and is wearing, reviewed safety precautions  [facility] CM Short Term Goal #1   Pt will work with home health PT and complete prescribed exercises within 30 days.  [facility] CM Short Term Goal #1 Start Date  04/30/17  Interventions for Short Term Goal #1  RN CM reviewed importance of working closely with PT and  completing all prescribed exercises for better outcomes    [facility] CM Care Plan Problem Two     Most Recent Value  Care Plan Problem Two  Pain not effectively controlled  Role Documenting the Problem Two  Care Management [place] for Problem Two  Active  [facility] CM Short Term Goal #1   Pt will verbalize pain management strategies within 30 days.  [facility] CM Short Term Goal #1 Start Date  04/30/17  Interventions for Short Term Goal #2   RN CM reviewed taking pain management as prescribed, attending pain clinic appointments, reviewed alternate therapies such as relaxation      Plan: see pt for home visit next month Assist pt to complete advanced directives Assess pain Collaborate with home health as needed  Jacqlyn Larsen [facility], [place] [phone]

## 2017-04-30 NOTE — Telephone Encounter (Signed)
Patient is requesting something for the itching on her face until she can see Dr.Tafeen (he is out of the office until mid- march)
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
## 2017-04-30 NOTE — Patient Outreach (Signed)
[facility]) Care Management  04/30/2017  Kallen Delatorre Topham 1950/07/14 [id number]  67 year old female referred to [facility] due to high ED utilization.  [facility] services requested for medication management.  PMHx includes, but not limited to, anxiety, bipolar disorder, chronic back pain, fibromylagia, hypothyroidism, HTN, HLD, GERD, diverticulosis, IBS, meningioma s/p craniotomy, migraines, pulmonary hypertension, type 2 diabetes with neuropathy, seizures, stroke, and tobacco abuse. Multiple recent ED visits related to headaches, left arm pain, and weakness. Per notes, patient has a home health nurse from [facility] that fills pillbox every 2 weeks.   PCP: Dr. Tula Nakayama Insurance: [facility]  Successful outreach call to Ms. Bastidas today. HIPAA identifiers verified. Ms. Lineman is agreeable to a medication reconciliation.  She reports that she currently had a migraine and "did too much" today and would prefer to review the medications later this week.    Plan: Home visit scheduled with Ms. Renaud for medication reconciliation on Friday, June 01, 2017 at 10:30 AM.   Ralene Bathe, PharmD, [place] [phone]

## 2017-05-01 ENCOUNTER — Ambulatory Visit (INDEPENDENT_AMBULATORY_CARE_PROVIDER_SITE_OTHER): Payer: Medicare HMO | Admitting: Psychiatry

## 2017-05-01 ENCOUNTER — Encounter (HOSPITAL_COMMUNITY): Payer: Self-pay | Admitting: Psychiatry

## 2017-05-01 DIAGNOSIS — R296 Repeated falls: Secondary | ICD-10-CM | POA: Diagnosis not present

## 2017-05-01 DIAGNOSIS — F331 Major depressive disorder, recurrent, moderate: Secondary | ICD-10-CM | POA: Diagnosis not present

## 2017-05-01 DIAGNOSIS — J449 Chronic obstructive pulmonary disease, unspecified: Secondary | ICD-10-CM | POA: Diagnosis not present

## 2017-05-01 DIAGNOSIS — Z794 Long term (current) use of insulin: Secondary | ICD-10-CM | POA: Diagnosis not present

## 2017-05-01 DIAGNOSIS — F250 Schizoaffective disorder, bipolar type: Secondary | ICD-10-CM | POA: Diagnosis not present

## 2017-05-01 DIAGNOSIS — I509 Heart failure, unspecified: Secondary | ICD-10-CM | POA: Diagnosis not present

## 2017-05-01 DIAGNOSIS — E1142 Type 2 diabetes mellitus with diabetic polyneuropathy: Secondary | ICD-10-CM | POA: Diagnosis not present

## 2017-05-01 DIAGNOSIS — I69354 Hemiplegia and hemiparesis following cerebral infarction affecting left non-dominant side: Secondary | ICD-10-CM | POA: Diagnosis not present

## 2017-05-01 DIAGNOSIS — I110 Hypertensive heart disease with heart failure: Secondary | ICD-10-CM | POA: Diagnosis not present

## 2017-05-01 DIAGNOSIS — Z87891 Personal history of nicotine dependence: Secondary | ICD-10-CM | POA: Diagnosis not present

## 2017-05-01 NOTE — Telephone Encounter (Signed)
Message left on voicemail at patients request

## 2017-05-01 NOTE — Telephone Encounter (Signed)
She may use Cetaphil or eucerin face cream.  However she needs to follow-up with dermatologist.  Until she has been evaluated and we know the cause of the itching, it is hard for me to suggest something that would help it.  I would recommend she just try a soap like Cetaphil, and avoid all other soaps or lotions or anything with perfumes in it that could be causing irritation.

## 2017-05-01 NOTE — Telephone Encounter (Signed)
Can you recommend anything over the counter?
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
## 2017-05-02 ENCOUNTER — Ambulatory Visit (HOSPITAL_COMMUNITY): Payer: Self-pay | Admitting: Psychiatry

## 2017-05-02 NOTE — Telephone Encounter (Signed)
Referral entered for dermatology. Has OV March 13 and will address the back pain referral at that time

## 2017-05-03 DIAGNOSIS — R296 Repeated falls: Secondary | ICD-10-CM | POA: Diagnosis not present

## 2017-05-03 DIAGNOSIS — I509 Heart failure, unspecified: Secondary | ICD-10-CM | POA: Diagnosis not present

## 2017-05-03 DIAGNOSIS — F250 Schizoaffective disorder, bipolar type: Secondary | ICD-10-CM | POA: Diagnosis not present

## 2017-05-03 DIAGNOSIS — Z794 Long term (current) use of insulin: Secondary | ICD-10-CM | POA: Diagnosis not present

## 2017-05-03 DIAGNOSIS — J449 Chronic obstructive pulmonary disease, unspecified: Secondary | ICD-10-CM | POA: Diagnosis not present

## 2017-05-03 DIAGNOSIS — E1142 Type 2 diabetes mellitus with diabetic polyneuropathy: Secondary | ICD-10-CM | POA: Diagnosis not present

## 2017-05-03 DIAGNOSIS — I110 Hypertensive heart disease with heart failure: Secondary | ICD-10-CM | POA: Diagnosis not present

## 2017-05-03 DIAGNOSIS — I69354 Hemiplegia and hemiparesis following cerebral infarction affecting left non-dominant side: Secondary | ICD-10-CM | POA: Diagnosis not present

## 2017-05-03 DIAGNOSIS — E119 Type 2 diabetes mellitus without complications: Secondary | ICD-10-CM | POA: Diagnosis not present

## 2017-05-03 DIAGNOSIS — M150 Primary generalized (osteo)arthritis: Secondary | ICD-10-CM | POA: Diagnosis not present

## 2017-05-03 DIAGNOSIS — R32 Unspecified urinary incontinence: Secondary | ICD-10-CM | POA: Diagnosis not present

## 2017-05-03 DIAGNOSIS — Z87891 Personal history of nicotine dependence: Secondary | ICD-10-CM | POA: Diagnosis not present

## 2017-05-03 DIAGNOSIS — I639 Cerebral infarction, unspecified: Secondary | ICD-10-CM | POA: Diagnosis not present

## 2017-05-04 ENCOUNTER — Other Ambulatory Visit: Payer: Self-pay | Admitting: Pharmacist

## 2017-05-04 DIAGNOSIS — I110 Hypertensive heart disease with heart failure: Secondary | ICD-10-CM | POA: Diagnosis not present

## 2017-05-04 DIAGNOSIS — I509 Heart failure, unspecified: Secondary | ICD-10-CM | POA: Diagnosis not present

## 2017-05-04 DIAGNOSIS — Z87891 Personal history of nicotine dependence: Secondary | ICD-10-CM | POA: Diagnosis not present

## 2017-05-04 DIAGNOSIS — I69354 Hemiplegia and hemiparesis following cerebral infarction affecting left non-dominant side: Secondary | ICD-10-CM | POA: Diagnosis not present

## 2017-05-04 DIAGNOSIS — Z794 Long term (current) use of insulin: Secondary | ICD-10-CM | POA: Diagnosis not present

## 2017-05-04 DIAGNOSIS — F250 Schizoaffective disorder, bipolar type: Secondary | ICD-10-CM | POA: Diagnosis not present

## 2017-05-04 DIAGNOSIS — J449 Chronic obstructive pulmonary disease, unspecified: Secondary | ICD-10-CM | POA: Diagnosis not present

## 2017-05-04 DIAGNOSIS — E1142 Type 2 diabetes mellitus with diabetic polyneuropathy: Secondary | ICD-10-CM | POA: Diagnosis not present

## 2017-05-04 DIAGNOSIS — R296 Repeated falls: Secondary | ICD-10-CM | POA: Diagnosis not present

## 2017-05-04 NOTE — Patient Outreach (Addendum)
[facility]) Care Management  [place]   05/04/2017  [name] 03/16/1950 [id number]  Successful home visit with [name] at her home for a medication reconciliation.  HIPAA identifiers verified.   Subjective: [name] reports that she has 2 weekly pillboxes filled by a nurse every 2 weeks.  She denies having problems affording her medications.  She reports using both [name] order for her medications.  She states she is aware of the [facility] OTC program and currently uses it for her vitamins and OTC medications.  She reports checking her blood sugars 4x a day and that she has had very low readings in the past of 34 + 40.  She states she has not taken her oral medications yet today but did take Novolog with breakfast.   Objective:  Hemoglobin 6.0 (02/05/17) SCr = 0.81 (04/10/17) Blood pressure: 163/65 Heart rate: 66 CBG = 99  Encounter Medications: Medications Reviewed Today    Reviewed by Cloria Spring, MD (Psychiatrist) on 04/30/17 at 1400  Med List Status: <None>  Medication Order Taking? Sig Documenting Provider Last Dose Status Informant  acetaminophen (TYLENOL) 500 MG tablet [id number] Yes Take 500 mg by mouth every 4 (four) hours. [provider] Taking Active Multiple Informants  acetaminophen (TYLENOL) tablet 325 mg [id number]   [name], MD  Active   albuterol (PROVENTIL HFA;VENTOLIN HFA) 108 (90 Base) MCG/ACT inhaler [id number] Yes Inhale 1-2 puffs every 6 hours as needed for wheezing, shortness of breath Baird Lyons D, MD Taking Active Multiple Informants  aspirin EC 81 MG tablet [id number] Yes Take 81 mg by mouth daily. [provider] Taking Active Multiple Informants  Cholecalciferol (VITAMIN D) 2000 units CAPS [id number] Yes Take 1 capsule by mouth daily. [provider] Taking Active Multiple Informants  cloNIDine (CATAPRES) 0.3 MG tablet [id number] Yes TAKE 1 TABLET BY MOUTH  EVERY EIGHT HOURS. ONCE AT 8AM, 4PM, AND 12 MIDNIGHT. [name], MD Taking Active Multiple Informants  diclofenac sodium (VOLTAREN) 1 % GEL [id number] Yes Apply 2 g topically daily as needed (Pain). [name], MD Taking Active Multiple Informants  fluticasone [facility]) 50 MCG/ACT nasal spray [id number] Yes Place 2 sprays into both nostrils daily. [provider] Taking Active Multiple Informants  HYDROcodone-acetaminophen (NORCO/VICODIN) 5-325 MG tablet [id number] Yes Take 1 tablet by mouth 3 (three) times daily as needed for moderate pain. [provider] Taking Active Multiple Informants  hydroxychloroquine (PLAQUENIL) 200 MG tablet [id number] Yes Take 200 mg by mouth daily.  [provider] Taking Active Multiple Informants  insulin aspart (NOVOLOG FLEXPEN) 100 UNIT/ML FlexPen [id number] Yes INJECT 17 TO 24 UNITS INTO THE SKIN 3 (THREE) TIMES DAILY WITH MEALS. Philemon Kingdom, MD Taking Active Multiple Informants           Med Note Trinidad Curet, Celedonio Savage   Tue Apr 10, 2017  5:29 PM) 146-took 14 units  Insulin Glargine (TOUJEO SOLOSTAR) 300 UNIT/ML SOPN [id number] Yes Inject 30 Units into the skin at bedtime. Philemon Kingdom, MD Taking Active Multiple Informants  lamoTRIgine (LAMICTAL) 100 MG tablet [id number] Yes TAKE 1 TABLET BY MOUTH TWICE DAILY. Pieter Partridge, DO Taking Active Multiple Informants  levothyroxine (SYNTHROID, LEVOTHROID) 50 MCG tablet [id number] Yes TAKE 1 TABLET BY MOUTH ONCE DAILY AND 1/2 TABLET ON SUNDAYS. [name], MD Taking Active Multiple Informants  LORazepam (ATIVAN) 0.5 MG tablet [id number] Yes Take 1 tablet (0.5 mg total) by mouth 3 (three) times  daily. Cloria Spring, MD  Active   losartan (COZAAR) 50 MG tablet [id number] Yes Take 1 tablet (50 mg total) daily by mouth. [name], MD Taking Active Multiple Informants  meclizine (ANTIVERT) 25 MG tablet [id number] Yes Take 1 tablet (25 mg total) by mouth 3 (three) times  daily as needed for dizziness. Julianne Rice, MD Taking Active Multiple Informants  metoprolol tartrate (LOPRESSOR) 50 MG tablet [id number] Yes TAKE 1 TABLET BY MOUTH TWICE DAILY. Pixie Casino, MD Taking Active Multiple Informants  montelukast (SINGULAIR) 10 MG tablet [id number] Yes TAKE ONE TABLET BY MOUTH ONCE DAILY. [name], MD Taking Active Multiple Informants  Multiple Vitamins-Minerals (ONE-A-DAY 50 PLUS PO) [id number] Yes Take 1 tablet by mouth daily. [provider] Taking Active Multiple Informants  nicotine (NICODERM CQ - DOSED IN MG/24 HOURS) 14 mg/24hr patch [id number] Yes Place 1 patch (14 mg total) onto the skin daily. [name], MD Taking Active Multiple Informants  pregabalin (LYRICA) 75 MG capsule [id number] Yes Take 1 capsule (75 mg total) by mouth daily. Rexene Alberts, MD Taking Active Multiple Informants  RABEprazole (ACIPHEX) 20 MG tablet [id number] Yes TAKE 1 TABLET BY MOUTH TWICE DAILY. Jerene Bears, MD Taking Active Multiple Informants  RESTASIS 0.05 % ophthalmic emulsion [id number] Yes Place 1 drop into both eyes 2 (two) times daily.  [provider] Taking Active Multiple Informants           Med Note Georgiann Cocker, HEATHER L   Wed Jun 03, 2014 10:09 AM)     risperiDONE (RISPERDAL) 0.5 MG tablet [id number] Yes Take 1 tablet (0.5 mg total) by mouth at bedtime. Cloria Spring, MD  Active   rosuvastatin (CRESTOR) 5 MG tablet [id number] Yes TAKE 1 TABLET BY MOUTH AT BEDTIME. [name], MD Taking Active Multiple Informants  sertraline (ZOLOFT) 100 MG tablet [id number] Yes Take 1 tablet (100 mg total) by mouth daily. Cloria Spring, MD  Active   tiZANidine (ZANAFLEX) 4 MG tablet [id number] Yes Take 4 mg by mouth 2 (two) times daily as needed for muscle spasms. [provider] Taking Active Multiple Informants  traMADol (ULTRAM) 50 MG tablet [id number] Yes Take 1 tablet (50 mg total) by mouth every 6 (six) hours as needed.  Julianne Rice, MD Taking Active Multiple Informants  traZODone (DESYREL) 100 MG tablet [id number] Yes Take 1 tablet (100 mg total) by mouth at bedtime. Cloria Spring, MD  Active   UNABLE TO FIND [id number] Yes Hospital bed mattress x 1  DX: G47.33, J44.9 [name], MD Taking Active Multiple Informants  Med List Note Lisette Abu 04/10/17 2703): Medications have been confirmed via pharmacy records \\[facility]  [place] [facility] [phone]          Functional Status: In your present state of health, do you have any difficulty performing the following activities: 04/30/2017 12/06/2016  Hearing? N N  Vision? N N  Difficulty concentrating or making decisions? N N  Walking or climbing stairs? Y Y  Dressing or bathing? Y Y  Doing errands, shopping? Tempie Donning  Preparing Food and eating ? N Y  Using the Toilet? N N  In the past six months, have you accidently leaked urine? Y Y  Do you have problems with loss of bowel control? N N  Managing your Medications? Y N  Managing your Finances? Tempie Donning  Housekeeping or managing your Housekeeping? Y Y  Some recent data might be hidden  Fall/Depression Screening: Fall Risk  04/30/2017 04/11/2017 12/06/2016  Falls in the past year? Yes Yes Yes  Number falls in past yr: 2 or more 2 or more 2 or more  Injury with Fall? No No No  Comment - - -  Risk Factor Category  High Fall Risk High Fall Risk High Fall Risk  Risk for fall due to : History of fall(s);Impaired balance/gait;Medication side effect;Impaired mobility History of fall(s);Impaired balance/gait;Impaired mobility;Impaired vision;Medication side effect History of fall(s);Impaired balance/gait;Impaired mobility  Follow up Education provided;Falls evaluation completed;Falls prevention discussed Falls prevention discussed;Education provided Falls evaluation completed;Education provided;Falls prevention discussed   PHQ 2/9 Scores 04/30/2017 04/11/2017 12/06/2016  09/25/2016 04/21/2015 03/19/2015 02/10/2015  PHQ - 2 Score 1 1 2 2 1 2  0  PHQ- 9 Score - - 5 9 - 9 -    ASSESSMENT: Date Discharged from Hospital: 04/11/2017 Date Medication Reconciliation Performed: 05/04/2017  No new medications were prescribed at discharge.  Patient was recently discharged from hospital and all medications have been reviewed   Drugs sorted by system:  Neurologic/Psychologic: lamotrigine, lorazepam, meclizine, pregabalin, risperidone, sertraline, trazodone  Cardiovascular: aspirin 81mg , clonidine, losartan, metoprolol, rosuvastatin  Pulmonary/Allergy: albuterol inhaler, montelukast  Gastrointestinal: docusate, rabeprazole  Endocrine: insulin aspart, insulin glargine, levothyroxine  Renal:  Topical: nicotine patch, restasis  Pain:  Acetaminophen, diclofenac gel, tizanidine, tramadol  Vitamins/Minerals: ascorbic acid, cholecalciferol, MVI  Infectious Diseases:  Miscellaneous: Hydroxychloroquine  Duplications in therapy: no issues noted Gaps in therapy: no issues noted Medications to avoid in the elderly:  Meclizine: Per the Beers List, the antihistamine in this medication is highly anticholinergic, clearance may be reduced with advanced age, tolerance may develop when used as a hypnotic and should be avoided in the elderly. Antihistamine use in the elderly poses a greater risk of confusion, dry mouth, constipation, or other anticholinergic effects or toxicity. Per Beers list, medications with strong anticholinergic properties should be avoided in older adults with dementia and cognitive impairment, those with or at high risk of delirium as well as in patients with constipation.  Clonidine: Per Beers criteria this medication has been identified as potentially inappropriate in patients 65 years and older (independent of diagnosis or condition) due to the high risk of CNS adverse effects and risk of bradycardia and orthostatic hypotension. Avoid use as a first-line  antihypertensive.   Drug interactions: no issues noted  Other issues noted:  Patient describes episodes of severe hypoglycemic events with CBGs in 47s and 40s.  I counseled patient to have emergency sources of glucose available such as glucose gel or small packages of peanut butter to keep in her purse.  Patient voiced understanding and reports she will  purchase this week.   Patient also reports taking 34 units Toujeo QHS when instructions are for 30 units.  I counseled patient to take insulin dose as ordered.  Patient may warrant an reduction in her insulin to prevent further hypoglycemic events as A1C is already very low.    Patient is out of aspirin but reports she is waiting for it to arrive from the [facility] mail order program.   Trazodone bottle had different instructions than medication list in [facility]. Per patient, dose recently decreased to 100mg  QHS which reflects how RN is filling pill boxes.   Blood pressure elevated during visit and per review of blood pressure machine, SBP typically ranges 160s-170s.     Patient had hand tremors during visit and had difficulty using lancet device to check CBGs.    Medication adherence:  Patient taking medications correctly per review of pill box.  She had not yet taken her morning pills today however.  I assisted patient with taking medications during my home visit.  I verified that the pillbox was filled correctly except for 1 day which had extra doses of several evening medications.  I corrected pillbox during visit.    PLAN: I will route my letter to patient's provider regarding hypoglycemia events, and tremors and difficulty with checking CBGs. I will follow-up with patient next week to ensure she has emergency supply of glucose gel or peanut butter on hand.   Ralene Bathe, PharmD, [place] [phone]

## 2017-05-07 ENCOUNTER — Other Ambulatory Visit: Payer: Self-pay | Admitting: Internal Medicine

## 2017-05-07 DIAGNOSIS — F250 Schizoaffective disorder, bipolar type: Secondary | ICD-10-CM | POA: Diagnosis not present

## 2017-05-07 DIAGNOSIS — I509 Heart failure, unspecified: Secondary | ICD-10-CM | POA: Diagnosis not present

## 2017-05-07 DIAGNOSIS — Z87891 Personal history of nicotine dependence: Secondary | ICD-10-CM | POA: Diagnosis not present

## 2017-05-07 DIAGNOSIS — R296 Repeated falls: Secondary | ICD-10-CM | POA: Diagnosis not present

## 2017-05-07 DIAGNOSIS — J449 Chronic obstructive pulmonary disease, unspecified: Secondary | ICD-10-CM | POA: Diagnosis not present

## 2017-05-07 DIAGNOSIS — I69354 Hemiplegia and hemiparesis following cerebral infarction affecting left non-dominant side: Secondary | ICD-10-CM | POA: Diagnosis not present

## 2017-05-07 DIAGNOSIS — E1142 Type 2 diabetes mellitus with diabetic polyneuropathy: Secondary | ICD-10-CM | POA: Diagnosis not present

## 2017-05-07 DIAGNOSIS — Z794 Long term (current) use of insulin: Secondary | ICD-10-CM | POA: Diagnosis not present

## 2017-05-07 DIAGNOSIS — I110 Hypertensive heart disease with heart failure: Secondary | ICD-10-CM | POA: Diagnosis not present

## 2017-05-08 DIAGNOSIS — F250 Schizoaffective disorder, bipolar type: Secondary | ICD-10-CM | POA: Diagnosis not present

## 2017-05-08 DIAGNOSIS — E1142 Type 2 diabetes mellitus with diabetic polyneuropathy: Secondary | ICD-10-CM | POA: Diagnosis not present

## 2017-05-08 DIAGNOSIS — J449 Chronic obstructive pulmonary disease, unspecified: Secondary | ICD-10-CM | POA: Diagnosis not present

## 2017-05-08 DIAGNOSIS — R296 Repeated falls: Secondary | ICD-10-CM | POA: Diagnosis not present

## 2017-05-08 DIAGNOSIS — Z794 Long term (current) use of insulin: Secondary | ICD-10-CM | POA: Diagnosis not present

## 2017-05-08 DIAGNOSIS — I69354 Hemiplegia and hemiparesis following cerebral infarction affecting left non-dominant side: Secondary | ICD-10-CM | POA: Diagnosis not present

## 2017-05-08 DIAGNOSIS — I110 Hypertensive heart disease with heart failure: Secondary | ICD-10-CM | POA: Diagnosis not present

## 2017-05-08 DIAGNOSIS — I509 Heart failure, unspecified: Secondary | ICD-10-CM | POA: Diagnosis not present

## 2017-05-08 DIAGNOSIS — Z87891 Personal history of nicotine dependence: Secondary | ICD-10-CM | POA: Diagnosis not present

## 2017-05-09 ENCOUNTER — Ambulatory Visit: Payer: Medicare HMO | Admitting: Podiatry

## 2017-05-09 DIAGNOSIS — F250 Schizoaffective disorder, bipolar type: Secondary | ICD-10-CM | POA: Diagnosis not present

## 2017-05-09 DIAGNOSIS — I110 Hypertensive heart disease with heart failure: Secondary | ICD-10-CM | POA: Diagnosis not present

## 2017-05-09 DIAGNOSIS — J449 Chronic obstructive pulmonary disease, unspecified: Secondary | ICD-10-CM | POA: Diagnosis not present

## 2017-05-09 DIAGNOSIS — I69354 Hemiplegia and hemiparesis following cerebral infarction affecting left non-dominant side: Secondary | ICD-10-CM | POA: Diagnosis not present

## 2017-05-09 DIAGNOSIS — Z87891 Personal history of nicotine dependence: Secondary | ICD-10-CM | POA: Diagnosis not present

## 2017-05-09 DIAGNOSIS — R296 Repeated falls: Secondary | ICD-10-CM | POA: Diagnosis not present

## 2017-05-09 DIAGNOSIS — Z794 Long term (current) use of insulin: Secondary | ICD-10-CM | POA: Diagnosis not present

## 2017-05-09 DIAGNOSIS — E1142 Type 2 diabetes mellitus with diabetic polyneuropathy: Secondary | ICD-10-CM | POA: Diagnosis not present

## 2017-05-09 DIAGNOSIS — I509 Heart failure, unspecified: Secondary | ICD-10-CM | POA: Diagnosis not present

## 2017-05-10 ENCOUNTER — Other Ambulatory Visit: Payer: Self-pay | Admitting: Internal Medicine

## 2017-05-10 ENCOUNTER — Telehealth: Payer: Self-pay | Admitting: *Deleted

## 2017-05-10 ENCOUNTER — Other Ambulatory Visit: Payer: Self-pay | Admitting: Pharmacist

## 2017-05-10 ENCOUNTER — Other Ambulatory Visit: Payer: Self-pay | Admitting: Family Medicine

## 2017-05-10 ENCOUNTER — Ambulatory Visit: Payer: Self-pay | Admitting: Pharmacist

## 2017-05-10 ENCOUNTER — Other Ambulatory Visit (HOSPITAL_COMMUNITY): Payer: Self-pay | Admitting: Psychiatry

## 2017-05-10 ENCOUNTER — Telehealth: Payer: Self-pay

## 2017-05-10 NOTE — Telephone Encounter (Signed)
Author: Philemon Kingdom, MD Service: Endocrinology Author Type: Physician  Filed: 05/10/2017 9:55 AM Encounter Date: 05/04/2017 Status: Signed  Editor: Philemon Kingdom, MD (Physician)       [] Hide copied text  [] Hover for details   Nira Conn, I got a message that this pt is having frequent lows in the 30-40s. Please call her to change her insulin regimen as follows: - Toujeo30 >> 20units at bedtime - Novolog:  17>> 10 units with a smaller meal  24>> 14 units with a larger meal or with lunch  Please call us back in 2-3 days with the sugars.

## 2017-05-10 NOTE — Telephone Encounter (Signed)
Pt advised and voiced understanding.  New medication changes were read back to me by pt to confirm understanding.

## 2017-05-10 NOTE — Progress Notes (Signed)
Heather, I got a message that this pt is having frequent lows in the 30-40s. Please call her to change her insulin regimen as follows: - Toujeo 30 >> 20 units at bedtime - Novolog:  17 >> 10 units with a smaller meal  24 >> 14 units with a larger meal or with lunch  Please call us back in 2-3 days with the sugars.

## 2017-05-10 NOTE — Telephone Encounter (Signed)
FYI her BP was 170/80.  Mark in PT said you all wanted to know when her BP was high,  Msg from 05/09/17 on front desk voice mail.

## 2017-05-10 NOTE — Telephone Encounter (Signed)
-----   Message from Philemon Kingdom, MD sent at 05/10/2017  9:55 AM EST -----   ----- Message ----- From: Rudean Haskell, [facility] Sent: 05/10/2017   9:09 AM To: Philemon Kingdom, MD  Hi [name],  I was referred to review medications on one of your patients, [name].  During our visit on 05/04/17, patient reported at least 2 hypoglycemic events with CBGs in 30s-40s.  Today during phone call follow-up, she reported another incident of hypoglycemia this week.  I have encouraged her to have emergency supply of glucose gel and candy on hand.  I sent my note to her PCP last week regarding hypoglycemia concerns because I did not realize she was seeing you for her diabetes management.  I wanted to make you aware and see if you had any recommendations for patient?  Does she warrant a reduction in insulin dose?   Thanks!  Ralene Bathe, PharmD, [place] [phone]

## 2017-05-10 NOTE — Patient Outreach (Signed)
[facility]) Care Management  05/10/2017  [name] [birth year] [id number]  Outreach call placed to [name] to follow-up on hypoglycemia.  HIPAA identifiers verified. Patient reports she had another hypoglycemic event in the last week when she administered her insulin and then was distracted by a phone call and did not eat right away.  She reports she had to crawl to the kitchen and eat pancake syrup in order to raise her blood sugar. Patient reports that she will go to the store tomorrow to buy candy and glucose gel to have on hand for hypoglycemia emergencies.  She reports that [name] manages her diabetes.    Plan: I will route my note to [name] regarding hypoglycemia concerns.   Ralene Bathe, PharmD, [place] [phone]

## 2017-05-10 NOTE — Progress Notes (Signed)
Colleen, Thank you for the message >> we will contact her to decrease her insulins and recheck on her in 2-3 days. Sincerely, Philemon Kingdom MD

## 2017-05-11 DIAGNOSIS — J449 Chronic obstructive pulmonary disease, unspecified: Secondary | ICD-10-CM | POA: Diagnosis not present

## 2017-05-11 DIAGNOSIS — I10 Essential (primary) hypertension: Secondary | ICD-10-CM | POA: Diagnosis not present

## 2017-05-11 NOTE — Telephone Encounter (Signed)
All recently documented BP are normal so not clear as to why elevated. Has appt in 2 weeks with me , will evaluate at that time

## 2017-05-14 ENCOUNTER — Other Ambulatory Visit: Payer: Self-pay | Admitting: Pharmacist

## 2017-05-14 ENCOUNTER — Other Ambulatory Visit (HOSPITAL_COMMUNITY): Payer: Self-pay | Admitting: Psychiatry

## 2017-05-14 ENCOUNTER — Ambulatory Visit: Payer: Self-pay | Admitting: Pharmacist

## 2017-05-14 DIAGNOSIS — R296 Repeated falls: Secondary | ICD-10-CM | POA: Diagnosis not present

## 2017-05-14 DIAGNOSIS — I69354 Hemiplegia and hemiparesis following cerebral infarction affecting left non-dominant side: Secondary | ICD-10-CM | POA: Diagnosis not present

## 2017-05-14 DIAGNOSIS — I110 Hypertensive heart disease with heart failure: Secondary | ICD-10-CM | POA: Diagnosis not present

## 2017-05-14 DIAGNOSIS — I509 Heart failure, unspecified: Secondary | ICD-10-CM | POA: Diagnosis not present

## 2017-05-14 DIAGNOSIS — F250 Schizoaffective disorder, bipolar type: Secondary | ICD-10-CM | POA: Diagnosis not present

## 2017-05-14 DIAGNOSIS — J449 Chronic obstructive pulmonary disease, unspecified: Secondary | ICD-10-CM | POA: Diagnosis not present

## 2017-05-14 DIAGNOSIS — E1142 Type 2 diabetes mellitus with diabetic polyneuropathy: Secondary | ICD-10-CM | POA: Diagnosis not present

## 2017-05-14 DIAGNOSIS — Z87891 Personal history of nicotine dependence: Secondary | ICD-10-CM | POA: Diagnosis not present

## 2017-05-14 DIAGNOSIS — Z794 Long term (current) use of insulin: Secondary | ICD-10-CM | POA: Diagnosis not present

## 2017-05-14 NOTE — Patient Outreach (Signed)
[facility] [facility]) Care Management  05/14/2017  Loxley Cibrian Noseworthy 25-May-1950 [id number]   Patient's insulin dose recently reduced 05/10/2017 by endocrinologist, [name], due to severe hypoglycemic events.  Successful outreach call placed to Ms. Askari to follow up on hypoglycemia and insulin adjustments.  HIPAA identifiers verified.    Subjective:  Patient reports she is still having some episodes when her legs get " wobbly" and reports it  "feels like I may fall over on my head" when she looks down.  Ms. Albano reports eating small meals consisting of peanut butter crackers or a fruit cup along with sugar free muscadine juice, water or coffee.  Ms. Inclan reports that her glucose has been elevated since the doctor decreased her insulin however cannot remember what her CBGs have been.  She states they have been in the "hundreds." She stated her glucose today was 194 at breakfast and her blood pressure was 174/66.  Patient also reports a glucose of 77 before lunch today, after which time she ate some sausage and eggs.  Patient reports that she did not take any insulin with lunch because she recognized her glucose was low.  Patient has not purchased the glucose gel recommend at our last visit, but states she plans to go out Tuesday or Wednesday and will get it at that time.    When questioned about how she is taking her insulin,  patient reports: Novolog 14 units three times daily with meals  Toujeo 20 units at bedtime  Assessment: Patient with recent adjustments to insulin based on severe hypoglycemic events.  Per Dr. Arman Filter note on 05/10/2017, new insulin regimen is as follows:   Novolog 10 units with smaller meals Novolog 14 units with large meals or with lunch Toujeo 20 units at bedtime  Patient has correctly reduced her long-acting insulin dose but continues to take a larger dose of her short-acting insulin with meals.  I reviewed new insulin instructions with patient.  Patient's aide  was available to record the new regimen on a sheet of paper for Ms. Ayer to refer to when taking her insulin. I reviewed risks of hypoglycemia with patient and encouraged her to have emergency supply of glucose gel on hand and to only administer the short-acting insulin immediately prior to eating.    Plan: I will follow up with Ms. Wynelle Cleveland next week regarding her insulin regimen and to follow-up on CBGs.  I will route this note to [name].   Joetta Manners, PharmD Clinical Pharmacist  [place] [phone]  Note above written by [facility] pharmacist Joetta Manners.  I agree with assessment and plan.   Ralene Bathe, PharmD, [place] [phone]

## 2017-05-15 DIAGNOSIS — I69354 Hemiplegia and hemiparesis following cerebral infarction affecting left non-dominant side: Secondary | ICD-10-CM | POA: Diagnosis not present

## 2017-05-15 DIAGNOSIS — R296 Repeated falls: Secondary | ICD-10-CM | POA: Diagnosis not present

## 2017-05-15 DIAGNOSIS — J449 Chronic obstructive pulmonary disease, unspecified: Secondary | ICD-10-CM | POA: Diagnosis not present

## 2017-05-15 DIAGNOSIS — E1142 Type 2 diabetes mellitus with diabetic polyneuropathy: Secondary | ICD-10-CM | POA: Diagnosis not present

## 2017-05-15 DIAGNOSIS — Z794 Long term (current) use of insulin: Secondary | ICD-10-CM | POA: Diagnosis not present

## 2017-05-15 DIAGNOSIS — I110 Hypertensive heart disease with heart failure: Secondary | ICD-10-CM | POA: Diagnosis not present

## 2017-05-15 DIAGNOSIS — F250 Schizoaffective disorder, bipolar type: Secondary | ICD-10-CM | POA: Diagnosis not present

## 2017-05-15 DIAGNOSIS — I509 Heart failure, unspecified: Secondary | ICD-10-CM | POA: Diagnosis not present

## 2017-05-15 DIAGNOSIS — Z87891 Personal history of nicotine dependence: Secondary | ICD-10-CM | POA: Diagnosis not present

## 2017-05-16 DIAGNOSIS — I509 Heart failure, unspecified: Secondary | ICD-10-CM | POA: Diagnosis not present

## 2017-05-16 DIAGNOSIS — Z87891 Personal history of nicotine dependence: Secondary | ICD-10-CM | POA: Diagnosis not present

## 2017-05-16 DIAGNOSIS — Z794 Long term (current) use of insulin: Secondary | ICD-10-CM | POA: Diagnosis not present

## 2017-05-16 DIAGNOSIS — J449 Chronic obstructive pulmonary disease, unspecified: Secondary | ICD-10-CM | POA: Diagnosis not present

## 2017-05-16 DIAGNOSIS — E1142 Type 2 diabetes mellitus with diabetic polyneuropathy: Secondary | ICD-10-CM | POA: Diagnosis not present

## 2017-05-16 DIAGNOSIS — I110 Hypertensive heart disease with heart failure: Secondary | ICD-10-CM | POA: Diagnosis not present

## 2017-05-16 DIAGNOSIS — R296 Repeated falls: Secondary | ICD-10-CM | POA: Diagnosis not present

## 2017-05-16 DIAGNOSIS — F250 Schizoaffective disorder, bipolar type: Secondary | ICD-10-CM | POA: Diagnosis not present

## 2017-05-16 DIAGNOSIS — I69354 Hemiplegia and hemiparesis following cerebral infarction affecting left non-dominant side: Secondary | ICD-10-CM | POA: Diagnosis not present

## 2017-05-17 ENCOUNTER — Telehealth: Payer: Self-pay | Admitting: Internal Medicine

## 2017-05-17 DIAGNOSIS — J189 Pneumonia, unspecified organism: Secondary | ICD-10-CM

## 2017-05-17 MED ORDER — DOXYCYCLINE HYCLATE 100 MG PO TABS: 100.0000 mg | ORAL_TABLET | Freq: Two times a day (BID) | ORAL | 0 refills | Status: DC

## 2017-05-17 NOTE — Telephone Encounter (Signed)
OK to order CXR at [place] acquired pneumonia         Ok to offer Rx doxycycline 100 mg, # 14, 1 twice daily

## 2017-05-17 NOTE — Telephone Encounter (Signed)
Called and spoke with patient she states that she has been having chills, chest tightness, wheezing, left arm pain, coughing and congestion. Patient denies fever and body chills. She would like to have a chest xray done at [facility]. CY please advise.   Current Outpatient Medications on File Prior to Visit  Medication Sig Dispense Refill  . ACCU-CHEK SMARTVIEW test strip TEST BLOOD SUGAR FOUR TIMES DAILY AS DIRECTED 350 each 2  . acetaminophen (TYLENOL) 500 MG tablet Take 500 mg by mouth every 4 (four) hours.    [name] albuterol (PROVENTIL HFA;VENTOLIN HFA) 108 (90 Base) MCG/ACT inhaler Inhale 1-2 puffs every 6 hours as needed for wheezing, shortness of breath 3 Inhaler 3  . ascorbic acid (VITAMIN C) 500 MG tablet Take 500 mg by mouth daily.    [name] aspirin EC 81 MG tablet Take 81 mg by mouth daily.    . Cholecalciferol (VITAMIN D) 2000 units CAPS Take 1 capsule by mouth daily.    . cloNIDine (CATAPRES) 0.3 MG tablet TAKE 1 TABLET BY MOUTH EVERY EIGHT HOURS. ONCE AT 8AM, 4PM, AND 12 MIDNIGHT. 270 tablet 2  . diclofenac sodium (VOLTAREN) 1 % GEL Apply 2 g topically daily as needed (Pain). 100 g 2  . docusate sodium (COLACE) 100 MG capsule Take 100 mg by mouth daily.    . hydroxychloroquine (PLAQUENIL) 200 MG tablet Take 200 mg by mouth daily.     . insulin aspart (NOVOLOG FLEXPEN) 100 UNIT/ML FlexPen INJECT 17 TO 24 UNITS INTO THE SKIN 3 (THREE) TIMES DAILY WITH MEALS. 45 mL 3  . Insulin Glargine (TOUJEO SOLOSTAR) 300 UNIT/ML SOPN Inject 30 Units into the skin at bedtime. 6 pen 3  . lamoTRIgine (LAMICTAL) 100 MG tablet TAKE 1 TABLET BY MOUTH TWICE DAILY. 60 tablet 0  . levothyroxine (SYNTHROID, LEVOTHROID) 50 MCG tablet TAKE 1 TABLET BY MOUTH ONCE DAILY AND 1/2 TABLET ON SUNDAYS. 30 tablet 0  . LORazepam (ATIVAN) 0.5 MG tablet Take 1 tablet (0.5 mg total) by mouth 3 (three) times daily. 270 tablet 2  . losartan (COZAAR) 50 MG tablet Take 1 tablet (50 mg total) daily by mouth. 90 tablet 1  . meclizine  (ANTIVERT) 25 MG tablet Take 1 tablet (25 mg total) by mouth 3 (three) times daily as needed for dizziness. 30 tablet 0  . metoprolol tartrate (LOPRESSOR) 50 MG tablet TAKE 1 TABLET BY MOUTH TWICE DAILY. 60 tablet 0  . montelukast (SINGULAIR) 10 MG tablet TAKE ONE TABLET BY MOUTH ONCE DAILY. 90 tablet 0  . Multiple Vitamins-Minerals (HM MULTIVITAMIN ADULT GUMMY PO) Take 1 tablet by mouth daily.    . nicotine (NICODERM CQ - DOSED IN MG/24 HOURS) 14 mg/24hr patch Place 1 patch (14 mg total) onto the skin daily. 28 patch 0  . pregabalin (LYRICA) 75 MG capsule Take 1 capsule (75 mg total) by mouth daily.    . RABEprazole (ACIPHEX) 20 MG tablet TAKE 1 TABLET BY MOUTH TWICE DAILY. 180 tablet 0  . RESTASIS 0.05 % ophthalmic emulsion Place 1 drop into both eyes 2 (two) times daily.     . risperiDONE (RISPERDAL) 0.5 MG tablet Take 1 tablet (0.5 mg total) by mouth at bedtime. 90 tablet 2  . rosuvastatin (CRESTOR) 5 MG tablet TAKE 1 TABLET BY MOUTH AT BEDTIME. 90 tablet 0  . sertraline (ZOLOFT) 100 MG tablet Take 1 tablet (100 mg total) by mouth daily. 90 tablet 2  . tiZANidine (ZANAFLEX) 2 MG tablet Take 2 mg by mouth daily.    [name]  tiZANidine (ZANAFLEX) 4 MG tablet Take 4 mg by mouth 2 (two) times daily as needed for muscle spasms.    . traMADol (ULTRAM) 50 MG tablet Take 1 tablet (50 mg total) by mouth every 6 (six) hours as needed. (Patient taking differently: Take 50 mg by mouth 3 (three) times daily as needed for moderate pain. ) 15 tablet 0  . traZODone (DESYREL) 100 MG tablet Take 1 tablet (100 mg total) by mouth at bedtime. 90 tablet 2   No current facility-administered medications on file prior to visit.      Allergies  Allergen Reactions  . Cephalexin Hives  . Iron Nausea And Vomiting  . Milk-Related Compounds Other (See Comments)    Doesn't agree with stomach.   . Penicillins Hives    Has patient had a PCN reaction causing immediate rash, facial/tongue/throat swelling, SOB or lightheadedness  with hypotension: Yes Has patient had a PCN reaction causing severe rash involving mucus membranes or skin necrosis: No Has patient had a PCN reaction that required hospitalization No Has patient had a PCN reaction occurring within the last 10 years: No If all of the above answers are "NO", then may proceed with Cephalosporin use.   [name] Phenazopyridine Hcl Hives

## 2017-05-17 NOTE — Telephone Encounter (Signed)
Pt is aware of below recommendations and voiced her understanding. Rx for doxy has been sent to preferred pharmacy. CXR has been ordered. Nothing further is needed.

## 2017-05-18 ENCOUNTER — Telehealth: Payer: Self-pay | Admitting: Family Medicine

## 2017-05-18 NOTE — Telephone Encounter (Signed)
Patient called in to request that you send all medications to [place].  [phone]

## 2017-05-19 ENCOUNTER — Ambulatory Visit (HOSPITAL_COMMUNITY)
Admission: RE | Admit: 2017-05-19 | Discharge: 2017-05-19 | Disposition: A | Discharge status: Home / Self Care | Payer: Medicare HMO | Source: Ambulatory Visit | Attending: Internal Medicine | Admitting: Internal Medicine

## 2017-05-19 ENCOUNTER — Encounter (HOSPITAL_COMMUNITY): Payer: Self-pay | Admitting: Emergency Medicine

## 2017-05-19 ENCOUNTER — Ambulatory Visit (HOSPITAL_COMMUNITY): Admission: RE | Admit: 2017-05-19 | Payer: Medicare HMO | Source: Ambulatory Visit

## 2017-05-19 ENCOUNTER — Emergency Department (HOSPITAL_COMMUNITY): Payer: Medicare HMO

## 2017-05-19 ENCOUNTER — Emergency Department (HOSPITAL_COMMUNITY)
Admission: EM | Admit: 2017-05-19 | Discharge: 2017-05-19 | Disposition: A | Discharge status: Home / Self Care | Payer: Medicare HMO | Attending: Emergency Medicine | Admitting: Emergency Medicine

## 2017-05-19 ENCOUNTER — Other Ambulatory Visit: Payer: Self-pay

## 2017-05-19 ENCOUNTER — Encounter (HOSPITAL_COMMUNITY): Payer: Self-pay

## 2017-05-19 DIAGNOSIS — Z79899 Other long term (current) drug therapy: Secondary | ICD-10-CM | POA: Diagnosis not present

## 2017-05-19 DIAGNOSIS — E039 Hypothyroidism, unspecified: Secondary | ICD-10-CM | POA: Diagnosis not present

## 2017-05-19 DIAGNOSIS — G47419 Narcolepsy without cataplexy: Secondary | ICD-10-CM | POA: Diagnosis not present

## 2017-05-19 DIAGNOSIS — I10 Essential (primary) hypertension: Secondary | ICD-10-CM | POA: Diagnosis not present

## 2017-05-19 DIAGNOSIS — J189 Pneumonia, unspecified organism: Secondary | ICD-10-CM | POA: Insufficient documentation

## 2017-05-19 DIAGNOSIS — Z7982 Long term (current) use of aspirin: Secondary | ICD-10-CM | POA: Insufficient documentation

## 2017-05-19 DIAGNOSIS — D649 Anemia, unspecified: Secondary | ICD-10-CM | POA: Insufficient documentation

## 2017-05-19 DIAGNOSIS — R0602 Shortness of breath: Secondary | ICD-10-CM | POA: Diagnosis not present

## 2017-05-19 DIAGNOSIS — Z87891 Personal history of nicotine dependence: Secondary | ICD-10-CM | POA: Insufficient documentation

## 2017-05-19 DIAGNOSIS — G47429 Narcolepsy in conditions classified elsewhere without cataplexy: Secondary | ICD-10-CM | POA: Diagnosis not present

## 2017-05-19 DIAGNOSIS — R4020 Unspecified coma: Secondary | ICD-10-CM | POA: Diagnosis not present

## 2017-05-19 DIAGNOSIS — J449 Chronic obstructive pulmonary disease, unspecified: Secondary | ICD-10-CM | POA: Diagnosis not present

## 2017-05-19 DIAGNOSIS — R079 Chest pain, unspecified: Secondary | ICD-10-CM | POA: Diagnosis not present

## 2017-05-19 DIAGNOSIS — J40 Bronchitis, not specified as acute or chronic: Secondary | ICD-10-CM | POA: Insufficient documentation

## 2017-05-19 HISTORY — DX: Narcolepsy without cataplexy: G47.419

## 2017-05-19 LAB — CBC
HCT: 29.8 % — ABNORMAL LOW (ref 36.0–46.0)
Hemoglobin: 8.6 g/dL — ABNORMAL LOW (ref 12.0–15.0)
MCH: 20.9 pg — ABNORMAL LOW (ref 26.0–34.0)
MCHC: 28.9 g/dL — ABNORMAL LOW (ref 30.0–36.0)
MCV: 72.5 fL — AB (ref 78.0–100.0)
PLATELETS: 214 10*3/uL (ref 150–400)
RBC: 4.11 MIL/uL (ref 3.87–5.11)
RDW: 14.8 % (ref 11.5–15.5)
WBC: 4.7 10*3/uL (ref 4.0–10.5)

## 2017-05-19 LAB — URINALYSIS, ROUTINE W REFLEX MICROSCOPIC
Bilirubin Urine: NEGATIVE
Glucose, UA: NEGATIVE mg/dL
Hgb urine dipstick: NEGATIVE
Ketones, ur: NEGATIVE mg/dL
Leukocytes, UA: NEGATIVE
NITRITE: NEGATIVE
Protein, ur: NEGATIVE mg/dL
SPECIFIC GRAVITY, URINE: 1.016 (ref 1.005–1.030)
pH: 5 (ref 5.0–8.0)

## 2017-05-19 LAB — BASIC METABOLIC PANEL
Anion gap: 8 (ref 5–15)
BUN: 27 mg/dL — ABNORMAL HIGH (ref 6–20)
CHLORIDE: 108 mmol/L (ref 101–111)
CO2: 23 mmol/L (ref 22–32)
CREATININE: 1.11 mg/dL — AB (ref 0.44–1.00)
Calcium: 8.9 mg/dL (ref 8.9–10.3)
GFR calc non Af Amer: 51 mL/min — ABNORMAL LOW (ref >60)
GFR, EST AFRICAN AMERICAN: 59 mL/min — AB (ref >60)
Glucose, Bld: 99 mg/dL (ref 65–99)
Potassium: 4 mmol/L (ref 3.5–5.1)
Sodium: 139 mmol/L (ref 135–145)

## 2017-05-19 LAB — HEPATIC FUNCTION PANEL
ALT: 23 U/L (ref 14–54)
AST: 28 U/L (ref 15–41)
Albumin: 3.4 g/dL — ABNORMAL LOW (ref 3.5–5.0)
Alkaline Phosphatase: 88 U/L (ref 38–126)
Bilirubin, Direct: <0.1 mg/dL — ABNORMAL LOW (ref 0.1–0.5)
TOTAL PROTEIN: 6.3 g/dL — AB (ref 6.5–8.1)
Total Bilirubin: 0.8 mg/dL (ref 0.3–1.2)

## 2017-05-19 LAB — CBG MONITORING, ED: GLUCOSE-CAPILLARY: 104 mg/dL — AB (ref 65–99)

## 2017-05-19 LAB — BRAIN NATRIURETIC PEPTIDE: B Natriuretic Peptide: 55 pg/mL (ref 0.0–100.0)

## 2017-05-19 LAB — TROPONIN I: Troponin I: <0.03 ng/mL (ref <0.03)

## 2017-05-19 LAB — AMMONIA: Ammonia: 17 umol/L (ref 9–35)

## 2017-05-19 MED ORDER — HYDROCODONE-ACETAMINOPHEN 5-325 MG PO TABS
1.0000 | ORAL_TABLET | Freq: Once | ORAL | Status: AC
  Administered 2017-05-19: 1 via ORAL
  Filled 2017-05-19: qty 1

## 2017-05-19 MED ORDER — ALBUTEROL SULFATE HFA 108 (90 BASE) MCG/ACT IN AERS
2.0000 | INHALATION_SPRAY | Freq: Once | RESPIRATORY_TRACT | Status: AC
  Administered 2017-05-19: 2 via RESPIRATORY_TRACT
  Filled 2017-05-19: qty 6.7

## 2017-05-19 MED ORDER — NALOXONE HCL 2 MG/2ML IJ SOSY
2.0000 mg | PREFILLED_SYRINGE | Freq: Once | INTRAMUSCULAR | Status: AC
  Administered 2017-05-19: 2 mg via INTRAVENOUS
  Filled 2017-05-19: qty 2

## 2017-05-19 MED ORDER — SODIUM CHLORIDE 0.9 % IV BOLUS (SEPSIS)
500.0000 mL | Freq: Once | INTRAVENOUS | Status: AC
  Administered 2017-05-19: 500 mL via INTRAVENOUS

## 2017-05-19 NOTE — ED Triage Notes (Signed)
Patient from xray. Pt states she is having chest pain and shortness of breath that started today. Patient states pain radiate to left shoulder and arm. States history of COPD. Denies cough, fever, N/V/D.

## 2017-05-19 NOTE — ED Notes (Signed)
UA to find pt transport home  Bus schedule looked up per pt request

## 2017-05-19 NOTE — ED Provider Notes (Addendum)
[facility] EMERGENCY DEPARTMENT Provider Note   CSN: [id number] Arrival date & time: 05/19/17  8527     History   Chief Complaint Chief Complaint  [name] presents with  . Shortness of Breath  . Chest Pain    HPI [name] is a 67 y.o. female.  [name] is a 66 year old female who presents to the emergency department with her caregiver because of shortness of breath, chest discomfort, and arm pain.  History obtained primarily from the [name]'s caregiver.  The caregiver states that early this morning [name] came to assist [name].  [name] informed her that [name] needed to go to the hospital to have an x-ray done.  The [name] took her medications and seem to be in her usual baseline.  After arrival at the   radiology radiology department [name] complained of some pain in her left arm.  [name] then stated that [name] felt like [name] was a little more short of breath than usual.  The caregiver states that [name] was speaking to her, but seem to be breathing a little differently.  The [name] also complained of some chest discomfort.  It is of note that the [name] was recently diagnosed with possible pneumonia and was treated with antibiotic therapy.  [name] was to have the x-ray for follow-up of this pneumonia situation.  [name] presents now for assistance with these multiple issues.   The history is provided by the [name].    Past Medical History:  Diagnosis Date  . Anemia   . Anxiety    takes Ativan daily  . Arthritis   . Bipolar disorder ([place])    takes Risperdal nightly  . Blood transfusion   . Cancer ([place])    In her gum  . Carpal tunnel syndrome of right wrist 05/23/2011  . Cervical disc disorder with radiculopathy of cervical region 10/31/2012  . Chronic back pain   . Chronic idiopathic constipation   . Colon polyps   . COPD (chronic obstructive pulmonary disease) with chronic bronchitis ([place]) 09/16/2013   Office Spirometry 10/30/2013-submaximal effort based on appearance of loop and curve.  Numbers would fit with severe restriction but her physiologic capability may be better than this. FVC 0.91/44%, and 10.74/45%, FEV1/FVC 0.81, FEF 25-75% 1.43/69%    . Depression    takes Zoloft daily  . Diabetes mellitus    Type II  . Diverticulosis    TCS 9/08 by Dr. Delfin Edis for diarrhea . Bx for micro scopic colitis negative.   . Fibromyalgia   . Frequent falls   . GERD (gastroesophageal reflux disease)    takes Aciphex daily  . Glaucoma    eye drops daily  . Gum symptoms    infection on antibiotic  . Hemiplegia affecting non-dominant side, post-stroke 08/02/2011  . Hyperlipidemia    takes Crestor daily  . Hypertension    takes Amlodipine,Metoprolol,and Clonidine daily  . Hypothyroidism    takes Synthroid daily  . IBS (irritable bowel syndrome)   . Insomnia    takes Trazodone nightly  . 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 Metabolic encephalopathy 7/82/4235  . Migraines    chronic headaches  . Mononeuritis lower limb   . Osteoporosis   . Pancreatitis 2006   due to Depakote with normal EUS   . Schatzki's ring    non critical / EGD with ED 8/2011with RMR  . Seizures ([place])    takes Lamictal daily.Last seizure 3 yrs ago  . Sleep apnea    on CPAP  .  Stroke [place])    left sided weakness  . Tubular adenoma of colon     [name] Active Problem List   Diagnosis Date Noted  . 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 Rash and nonspecific skin eruption 04/25/2017  . Finger pain, right 01/28/2017  . Hypotension   . Diabetic polyneuropathy associated with type 2 diabetes mellitus ([place]) 05/19/2016  . Smoker 04/24/2016  . History of palpitations 08/09/2015  . Nausea without vomiting 08/09/2015  . Labile hypertension 08/03/2015  . Normal coronary arteries 08/03/2015  . Left-sided low back pain with left-sided sciatica 06/27/2015  . Left flank pain 06/27/2015  . Diabetes mellitus type 2 in obese ([place]) 05/06/2015  . Multinodular goiter 05/06/2015  . Rectocele, female 04/27/2015   . Anal sphincter incontinence 04/27/2015  . Pelvic relaxation due to rectocele 03/30/2015  . Pulmonary hypertension ([place]) 02/22/2015  . Light cigarette smoker (1-9 cigarettes per day) 01/11/2015  . Migraine without aura and without status migrainosus, not intractable 07/02/2014  . Flatulence 02/18/2014  . Microcytic anemia 02/18/2014  . COPD (chronic obstructive pulmonary disease) with chronic bronchitis ([place]) 09/16/2013  . Acute head trauma 08/16/2013  . Hypothyroidism 08/16/2013  . Gastroparesis 04/28/2013  . Seizure disorder ([place]) 01/19/2013  . Cervical disc disorder with radiculopathy of cervical region 10/31/2012  . Solitary pulmonary nodule 08/19/2012  . Anemia 07/05/2012  . 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 Allergic sinusitis 04/18/2012  . Meningioma ([place]) 11/19/2011  . 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 Carpal tunnel syndrome of right wrist 05/23/2011  . Polypharmacy 04/28/2011  . Bipolar disorder ([place]) 04/28/2011  . Constipation 04/13/2011  . Falls frequently 12/12/2010  . Oropharyngeal dysphagia 07/12/2010  . Urinary incontinence 12/16/2009  . HEARING LOSS 10/26/2009  . Hyperlipidemia 12/11/2008  . IBS 12/11/2008  . GERD 07/29/2008  . MILK PRODUCTS ALLERGY 07/29/2008  . 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 Backache 06/19/2007  . Osteoporosis 06/19/2007  . Obstructive sleep apnea 06/19/2007  . TRIGGER FINGER 04/18/2007  . DIVERTICULOSIS, COLON 11/13/2006    Past Surgical History:  Procedure Laterality Date  . ABDOMINAL HYSTERECTOMY  1978  . BACK SURGERY  July 2012  . BACTERIAL OVERGROWTH TEST N/A 05/05/2013   Procedure: BACTERIAL OVERGROWTH TEST;  Surgeon: Daneil Dolin, MD;  [facility];  Service: Endoscopy;  Laterality: N/A;  7:30  . BIOPSY THYROID  2009  . BRAIN SURGERY  11/2011   resection of meningioma  . BREAST REDUCTION SURGERY  1994  . CARDIAC CATHETERIZATION   05/10/2005   normal coronaries, normal LV systolic function and EF (Dr. Jackie Plum)  . CARPAL TUNNEL RELEASE Left 07/22/04   Dr. Aline Brochure  . CATARACT EXTRACTION Bilateral   . CHOLECYSTECTOMY  1984  . COLONOSCOPY N/A 09/25/2012   [facility]:ENIDPOE diverticulosis.  colonic polyp-removed : tubular adenoma  . CRANIOTOMY  11/23/2011   Procedure: CRANIOTOMY TUMOR EXCISION;  Surgeon: Hosie Spangle, MD;  Location: [place] [facility]O ORS;  Service: Neurosurgery;  Laterality: N/A;  Craniotomy for tumor resection  . ESOPHAGOGASTRODUODENOSCOPY  12/29/2010   [place]-Retained food in the esophagus and stomach, small hiatal hernia, status post Maloney dilation of the esophagus  . ESOPHAGOGASTRODUODENOSCOPY N/A 09/25/2012   [facility]:NTIRWERX atonic baggy esophagus status post Maloney dilation 79 F. Hiatal hernia  . GIVENS CAPSULE STUDY N/A 01/15/2013   NORMAL.   . IR GENERIC HISTORICAL  03/17/2016   IR RADIOLOGIST EVAL & MGMT 03/17/2016 [facility]-INTERV RAD  . LESION REMOVAL N/A 05/31/2015  Procedure: REMOVAL RIGHT AND LEFT LESIONS OF MANDIBLE;  Surgeon: Diona Browner, DDS;  Location: [facility];  Service: Oral Surgery;  Laterality: N/A;  . MALONEY DILATION  12/29/2010   RMR;  . NM MYOCAR PERF WALL MOTION  2006   "relavtiely normal" persantine, mild anterior thinning (breast attenuation artifact), no region of scar/ischemia  . OVARIAN CYST REMOVAL    . RECTOCELE REPAIR N/A 06/29/2015   Procedure: POSTERIOR REPAIR (RECTOCELE);  Surgeon: Jonnie Kind, MD;  Location: [facility];  Service: Gynecology;  Laterality: N/A;  . REDUCTION MAMMAPLASTY Bilateral   . SPINE SURGERY  09/29/2010   Dr. Rolena Infante  . surgical excision of 3 tumors from right thigh and right buttock  and left upper thigh  2010  . TOOTH EXTRACTION Bilateral 12/14/2014   Procedure: REMOVAL OF BILATERAL MANDIBULAR EXOSTOSES;  Surgeon: Diona Browner, DDS;  Location: [facility];  Service: Oral Surgery;  Laterality: Bilateral;  . TRANSTHORACIC ECHOCARDIOGRAM  2010   EF 60-65%, mild conc LVH, grade  1 diastolic dysfunction; mildly calcified MV annulus with mildly thickened leaflets, mildly calcified MR annulus    OB History    Gravida Para Term Preterm AB Living   6 1 1   5      SAB TAB Ectopic Multiple Live Births   5               Home Medications    Prior to Admission medications   Medication Sig Start Date End Date Taking? Authorizing Provider  ACCU-CHEK SMARTVIEW test strip TEST BLOOD SUGAR FOUR TIMES DAILY AS DIRECTED 05/08/17   Philemon Kingdom, MD  acetaminophen (TYLENOL) 500 MG tablet Take 500 mg by mouth every 4 (four) hours.    [provider]  albuterol (PROVENTIL HFA;VENTOLIN HFA) 108 (90 Base) MCG/ACT inhaler Inhale 1-2 puffs every 6 hours as needed for wheezing, shortness of breath 07/27/16   Baird Lyons D, MD  ascorbic acid (VITAMIN C) 500 MG tablet Take 500 mg by mouth daily.    [provider]  aspirin EC 81 MG tablet Take 81 mg by mouth daily.    [provider]  Cholecalciferol (VITAMIN D) 2000 units CAPS Take 1 capsule by mouth daily.    [provider]  cloNIDine (CATAPRES) 0.3 MG tablet TAKE 1 TABLET BY MOUTH EVERY EIGHT HOURS. ONCE AT 8AM, 4PM, AND 12 MIDNIGHT. 01/23/17   [name], MD  diclofenac sodium (VOLTAREN) 1 % GEL Apply 2 g topically daily as needed (Pain). 07/21/16   [name], MD  docusate sodium (COLACE) 100 MG capsule Take 100 mg by mouth daily.    [provider]  doxycycline (VIBRA-TABS) 100 MG tablet Take 1 tablet (100 mg total) by mouth 2 (two) times daily. 05/17/17   Baird Lyons D, MD  hydroxychloroquine (PLAQUENIL) 200 MG tablet Take 200 mg by mouth daily.     [provider]  insulin aspart (NOVOLOG FLEXPEN) 100 UNIT/ML FlexPen INJECT 17 TO 24 UNITS INTO THE SKIN 3 (THREE) TIMES DAILY WITH MEALS. 11/06/16   Philemon Kingdom, MD  Insulin Glargine (TOUJEO SOLOSTAR) 300 UNIT/ML SOPN Inject 30 Units into the skin at bedtime. 11/06/16   Philemon Kingdom, MD  lamoTRIgine  (LAMICTAL) 100 MG tablet TAKE 1 TABLET BY MOUTH TWICE DAILY. 04/02/17   Pieter Partridge, DO  levothyroxine (SYNTHROID, LEVOTHROID) 50 MCG tablet TAKE 1 TABLET BY MOUTH ONCE DAILY AND 1/2 TABLET ON SUNDAYS. 05/10/17   [name], MD  LORazepam (ATIVAN) 0.5 MG tablet  Take 1 tablet (0.5 mg total) by mouth 3 (three) times daily. 04/30/17 04/30/18  Cloria Spring, MD  losartan (COZAAR) 50 MG tablet Take 1 tablet (50 mg total) daily by mouth. 01/23/17   [name], MD  meclizine (ANTIVERT) 25 MG tablet Take 1 tablet (25 mg total) by mouth 3 (three) times daily as needed for dizziness. 04/03/17   Julianne Rice, MD  metoprolol tartrate (LOPRESSOR) 50 MG tablet TAKE 1 TABLET BY MOUTH TWICE DAILY. 05/10/17   Hilty, Nadean Corwin, MD  montelukast (SINGULAIR) 10 MG tablet TAKE ONE TABLET BY MOUTH ONCE DAILY. 02/08/17   [name], MD  Multiple Vitamins-Minerals (HM MULTIVITAMIN ADULT GUMMY PO) Take 1 tablet by mouth daily.    [provider]  nicotine (NICODERM CQ - DOSED IN MG/24 HOURS) 14 mg/24hr patch Place 1 patch (14 mg total) onto the skin daily. 03/01/17   [name], MD  pregabalin (LYRICA) 75 MG capsule Take 1 capsule (75 mg total) by mouth daily. 10/20/16   Rexene Alberts, MD  RABEprazole (ACIPHEX) 20 MG tablet TAKE 1 TABLET BY MOUTH TWICE DAILY. 04/02/17   Pyrtle, Lajuan Lines, MD  RESTASIS 0.05 % ophthalmic emulsion Place 1 drop into both eyes 2 (two) times daily.  02/04/14   [provider]  risperiDONE (RISPERDAL) 0.5 MG tablet Take 1 tablet (0.5 mg total) by mouth at bedtime. 04/30/17   Cloria Spring, MD  rosuvastatin (CRESTOR) 5 MG tablet TAKE 1 TABLET BY MOUTH AT BEDTIME. 01/08/17   [name], MD  sertraline (ZOLOFT) 100 MG tablet Take 1 tablet (100 mg total) by mouth daily. 04/30/17   Cloria Spring, MD  tiZANidine (ZANAFLEX) 2 MG tablet Take 2 mg by mouth daily.    [provider]  tiZANidine (ZANAFLEX) 4 MG tablet Take 4 mg by mouth 2  (two) times daily as needed for muscle spasms.    [provider]  traMADol (ULTRAM) 50 MG tablet Take 1 tablet (50 mg total) by mouth every 6 (six) hours as needed. [name] taking differently: Take 50 mg by mouth 3 (three) times daily as needed for moderate pain.  04/03/17   Julianne Rice, MD  traZODone (DESYREL) 100 MG tablet Take 1 tablet (100 mg total) by mouth at bedtime. 04/30/17   Cloria Spring, MD    Family History Family History  Problem Relation Age of Onset  . Heart attack Mother        HTN  . Pneumonia Father   . Kidney failure Father   . Diabetes Father   . Pancreatic cancer Sister   . Diabetes Brother   . Hypertension Brother   . Diabetes Brother   . Cancer Sister        breast   . Hypertension Son   . Sleep apnea Son   . Cancer Sister        pancreatic  . Stroke Maternal Grandmother   . Heart attack Maternal Grandfather   . Alcohol abuse Maternal Uncle   . Colon cancer Neg Hx   . Anesthesia problems Neg Hx   . Hypotension Neg Hx   . Malignant hyperthermia Neg Hx   . Pseudochol deficiency Neg Hx     Social History Social History   Tobacco Use  . Smoking status: Former Smoker    Packs/day: 0.25    Years: 7.00    Pack years: 1.75    Types: Cigarettes    Last attempt to quit: 03/13/2017  Years since quitting: 0.1  . Smokeless tobacco: Never Used  . Tobacco comment: continues to smoke - a little less than a 1/4 pack per da as of 05/01/2017  Substance Use Topics  . Alcohol use: No    Alcohol/week: 0.0 oz    Comment:    . Drug use: No     Allergies   Cephalexin; Iron; Milk-related compounds; Penicillins; and Phenazopyridine hcl   Review of Systems Review of Systems  Constitutional: Negative for activity change.       All ROS Neg except as noted in HPI  HENT: Negative for nosebleeds.   Eyes: Negative for photophobia and discharge.  Respiratory: Positive for shortness of breath. Negative for cough and wheezing.   Cardiovascular:  Positive for chest pain. Negative for palpitations.  Gastrointestinal: Negative for abdominal pain and blood in stool.  Genitourinary: Negative for dysuria, frequency and hematuria.  Musculoskeletal: Negative for arthralgias, back pain and neck pain.  Skin: Negative.   Neurological: Negative for dizziness, seizures and speech difficulty.  Psychiatric/Behavioral: Negative for confusion and hallucinations.     Physical Exam Updated Vital Signs BP (!) 92/56 (BP Location: Left Arm)   Pulse (!) 54   Temp 98.2 F (36.8 C) (Oral)   Resp 20   Ht 5\' 1"  (1.549 m)   Wt 70.8 kg (156 lb)   SpO2 100%   BMI 29.48 kg/m   Physical Exam  Constitutional: [name] is oriented to person, place, and time. [name] appears well-developed and well-nourished.  Non-toxic appearance.  [name] falls asleep during my interview and examination.  [name] is easily aroused, but falls asleep easily.  HENT:  Head: Normocephalic.  Right Ear: Tympanic membrane and external ear normal.  Left Ear: Tympanic membrane and external ear normal.  Eyes: EOM and lids are normal. Pupils are equal, round, and reactive to light.  Neck: Normal range of motion. Neck supple. Carotid bruit is not present.  Cardiovascular: Normal rate, regular rhythm, normal heart sounds, intact distal pulses and normal pulses.  Pulmonary/Chest: No stridor. No respiratory distress.  [name] has shallow respirations.  [name] is not using accessory muscles.  There is symmetrical rise and fall of the chest.  There are few scattered rhonchi bilaterally.  No wheezes appreciated.  Abdominal: Soft. Bowel sounds are normal. There is no tenderness. There is no guarding.  Musculoskeletal: Normal range of motion.  There is good range of motion of the left and right upper extremity.  Pain with movement of the left hand and wrist.  Pain with movement of the right index finger with mild swelling of the PIP joint.  [name] states this is been going on for about 5 weeks.  The  radial pulses are 2+.  Capillary refill is less than 2 seconds.  Lymphadenopathy:       Head (right side): No submandibular adenopathy present.       Head (left side): No submandibular adenopathy present.    [name] has no cervical adenopathy.  Neurological: [name] is alert and oriented to person, place, and time. [name] has normal strength. No cranial nerve deficit or sensory deficit.  [name] difficult to keep awake.  There is no facial asymmetry.  The [name] is awake her speech is clear.  Grip is symmetrical.  Sensory is symmetrical.  No gross neurologic deficit appreciated.  Skin: Skin is warm and dry.  Psychiatric: [name] has a normal mood and affect. Her speech is normal.  Nursing note and vitals reviewed.    ED Treatments / Results  Labs (all labs ordered are listed, but only abnormal results are displayed) Labs Reviewed  BASIC METABOLIC PANEL  CBC  TROPONIN I  BRAIN NATRIURETIC PEPTIDE  AMMONIA  URINALYSIS, ROUTINE W REFLEX MICROSCOPIC  HEPATIC FUNCTION PANEL  CBG MONITORING, ED    EKG  EKG Interpretation  Date/Time:  Saturday May 19 2017 09:29:34 EST Ventricular Rate:  56 PR Interval:    QRS Duration: 94 QT Interval:  431 QTC Calculation: 416 R Axis:   49 Text Interpretation:  Sinus rhythm Baseline wander When compared with ECG of 04/10/2017 and 03/26/2017 No significant change was found Confirmed by Francine Graven [phone]) on 05/19/2017 9:33:48 AM       Radiology Dg Chest 2 View  Result Date: 05/19/2017 CLINICAL DATA:  Chest pain.  Pneumonia follow-up. EXAM: CHEST - 2 VIEW COMPARISON:  November 12, 2016 FINDINGS: Coarsened lung markings suggesting bronchitic change. No focal infiltrate. The heart, hila, mediastinum, lungs, and pleura are otherwise normal. IMPRESSION: Bronchitic changes. Electronically Signed   By: Dorise Bullion III M.D   On: 05/19/2017 09:26    Procedures Procedures (including critical care time)  Medications Ordered in ED Medications  naloxone  [facility]) injection 2 mg (not administered)     Initial Impression / Assessment and Plan / ED Course  I have reviewed the triage vital signs and the nursing notes.  Pertinent labs & imaging results that were available during my care of the [name] were reviewed by me and considered in my medical decision making (see chart for details).       Final Clinical Impressions(s) / ED Diagnoses MDM Case discussed with Dr Thurnell Garbe EKG: No STEMI . Pt has 2 view chest about 1 hour ago. Reveals bronchitis, but no infiltrate. No other acute changes. Shallow respirations. Falls asleep during exam. This is different from usual per caregiver. CBG- 104 IV narcan given. Minimal change is pt's alertness.  Recheck: B/p 88 systolic. Saline bolus given. B/p up to 010 systolic. Will obtain a CT scan to eval for intracranial problem. Pt told CT Tech that [name] suffers for narcolepsy. This was not in [place] from previous records.  CT scan is negative for acute changes.  The basic metabolic panel shows the BUN to be elevated at 27, creatinine elevated at 1.11.  This is in the range of the [name] has been noted in their testing in the past. Complete blood count shows hemoglobin of 8.6, hematocrit of 29.8.  [name] has a history of anemia.  Troponin is less than 0.03.  Ammonia is normal at 17.  Urinalysis is negative for urinary tract infection.  Doubt this is the cause of changes and complaints for today's visit.  After fluids and after [name] returned from the [place], the [name] is eating and drinking in the emergency department.  [name] is awake and alert.  [name] is using her phone to call for her ride.  [name] requests to have something for her pain in her hand, wrist, and in her right index finger.  [name] has renal changes and diabetes.  Will use 1 tablet of hydrocodone for assistance with her pain.  I have asked the [name] to see her primary physician for additional management of this pain.  Feel that it is  safe for the [name] to be discharged home with very close follow-up by [name] concerning her chronic anemia and renal changes.  Will add albuterol for the [name]'s bronchitis that was diagnosed.  [name] is to follow-up with the pulmonary specialist concerning  these changes.   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

## 2017-05-19 NOTE — ED Notes (Signed)
Pt discharge is delayed while fluids finish

## 2017-05-19 NOTE — ED Notes (Addendum)
While in CT, Pt reports to [facility] CT tech that she has narcolepsy  Narcolepsy is listed in pt history

## 2017-05-19 NOTE — ED Notes (Signed)
Meal provided per pt request

## 2017-05-19 NOTE — Discharge Instructions (Signed)
Your chest x-ray is consistent with bronchitis.  Please continue your medications from the pulmonary specialist.  Please add albuterol 2 puffs every 4 hours.  Your lab work shows anemia being present.  You have been diagnosed with anemia in the past.  Your renal function remains elevated.  Your CT scan is negative for any acute changes or problems.  Please call [name] on Monday, and set up an appointment for recheck concerning your bronchitis, anemia, and renal changes.  Please return to the emergency department if any emergent changes, problems, or concerns.

## 2017-05-19 NOTE — ED Notes (Signed)
Call to [facility] [phone] to inform pt is ready to be discharged  She is currently out of area and asks if another can take her home

## 2017-05-19 NOTE — ED Notes (Signed)
Call to [phone] to ascertain if sister in law can transport pt home  No answer

## 2017-05-21 ENCOUNTER — Other Ambulatory Visit: Payer: Self-pay | Admitting: *Deleted

## 2017-05-21 ENCOUNTER — Ambulatory Visit (INDEPENDENT_AMBULATORY_CARE_PROVIDER_SITE_OTHER): Payer: Medicare HMO | Admitting: Otolaryngology

## 2017-05-21 DIAGNOSIS — I509 Heart failure, unspecified: Secondary | ICD-10-CM | POA: Diagnosis not present

## 2017-05-21 DIAGNOSIS — E1142 Type 2 diabetes mellitus with diabetic polyneuropathy: Secondary | ICD-10-CM | POA: Diagnosis not present

## 2017-05-21 DIAGNOSIS — R296 Repeated falls: Secondary | ICD-10-CM | POA: Diagnosis not present

## 2017-05-21 DIAGNOSIS — R040 Epistaxis: Secondary | ICD-10-CM | POA: Diagnosis not present

## 2017-05-21 DIAGNOSIS — Z794 Long term (current) use of insulin: Secondary | ICD-10-CM | POA: Diagnosis not present

## 2017-05-21 DIAGNOSIS — I69354 Hemiplegia and hemiparesis following cerebral infarction affecting left non-dominant side: Secondary | ICD-10-CM | POA: Diagnosis not present

## 2017-05-21 DIAGNOSIS — J449 Chronic obstructive pulmonary disease, unspecified: Secondary | ICD-10-CM | POA: Diagnosis not present

## 2017-05-21 DIAGNOSIS — I110 Hypertensive heart disease with heart failure: Secondary | ICD-10-CM | POA: Diagnosis not present

## 2017-05-21 DIAGNOSIS — Z87891 Personal history of nicotine dependence: Secondary | ICD-10-CM | POA: Diagnosis not present

## 2017-05-21 DIAGNOSIS — F250 Schizoaffective disorder, bipolar type: Secondary | ICD-10-CM | POA: Diagnosis not present

## 2017-05-21 NOTE — Patient Outreach (Signed)
Pt to ED 05/19/17 for dyspnea and diagnosed with bronchitis, telephone call to pt for follow up, HIPAA verified, pt reports she had chest xray, has albuterol inhaler, reports having all medications and taking as prescribed, pt states she will follow up with [name] Thursday and will make appointment with pulmonary MD. Home health continues to see pt and PT will be seeing pt today.  Pt states she is feeling better today.   PLAN See pt for home visit next week  Jacqlyn Larsen [facility], [place] Coordinator [phone]

## 2017-05-22 IMAGING — US US RENAL
1 series · 14 of 25 positions shown · non-contrast
Comparison: CT abdomen pelvis 06/02/2014

CLINICAL DATA: Patient with left flank pain for 2 weeks.

EXAM:
RENAL / URINARY TRACT ULTRASOUND COMPLETE

[Series 1: us renal · 0.27mm/px · 14 of 51 slices shown]
[im 1/51]
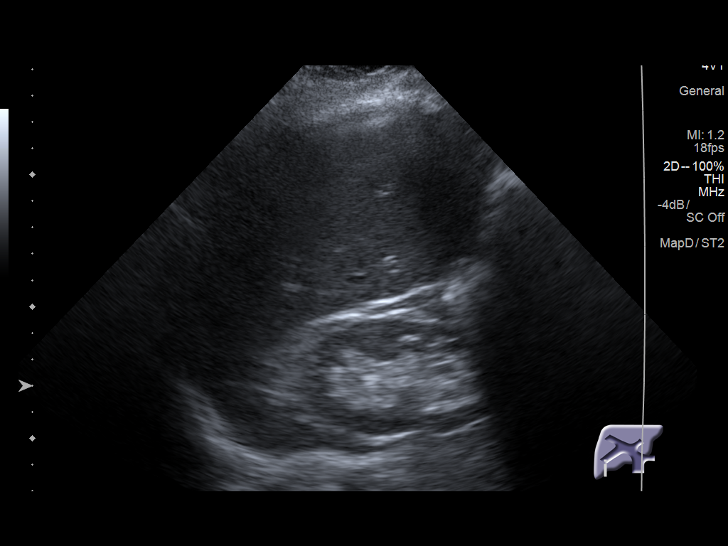
[im 5/51]
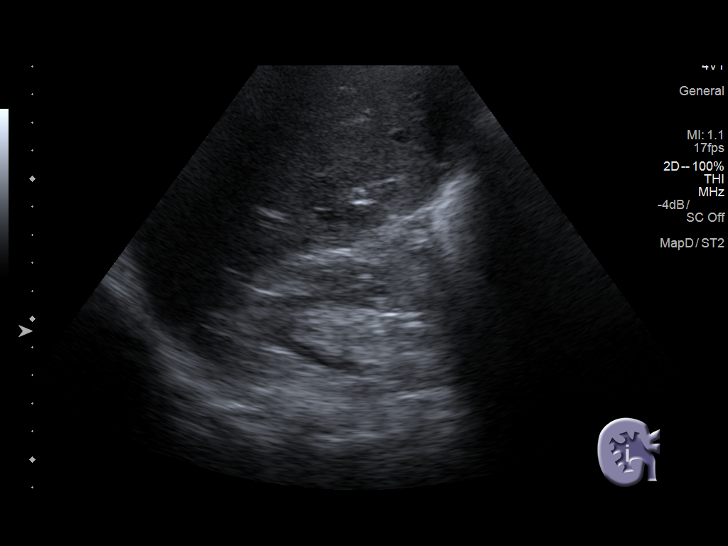
[im 9/51]
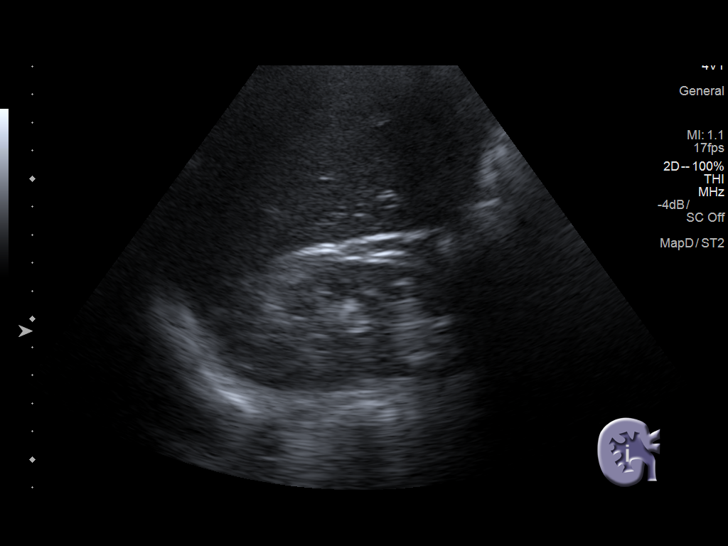
[im 13/51]
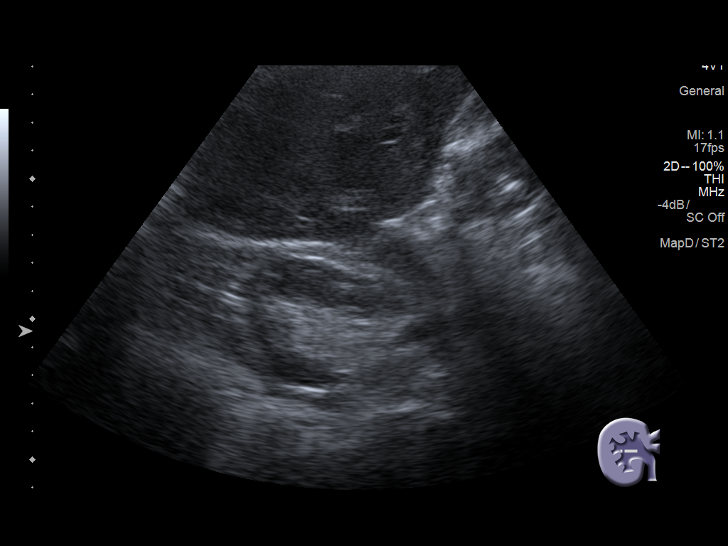
[im 17/51]
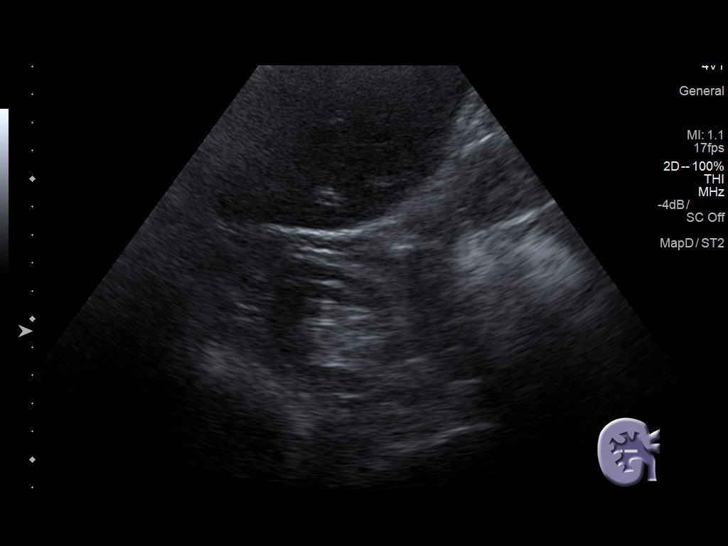
[im 19/51]
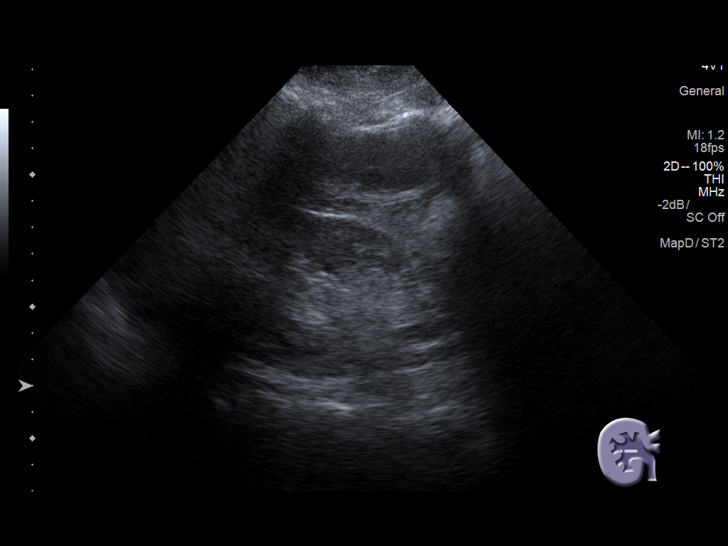
[im 23/51]
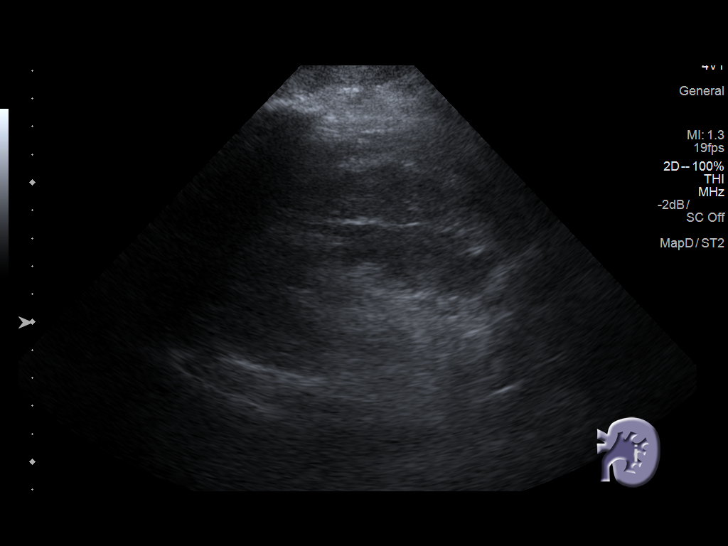
[im 28/51]
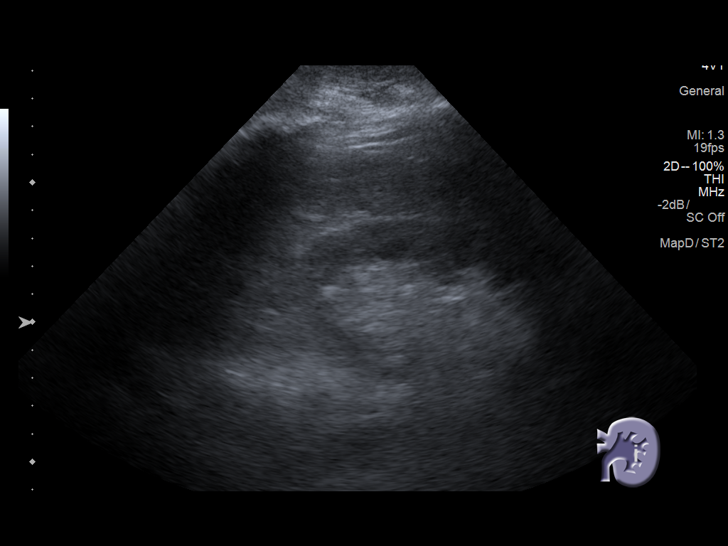
[im 32/51]
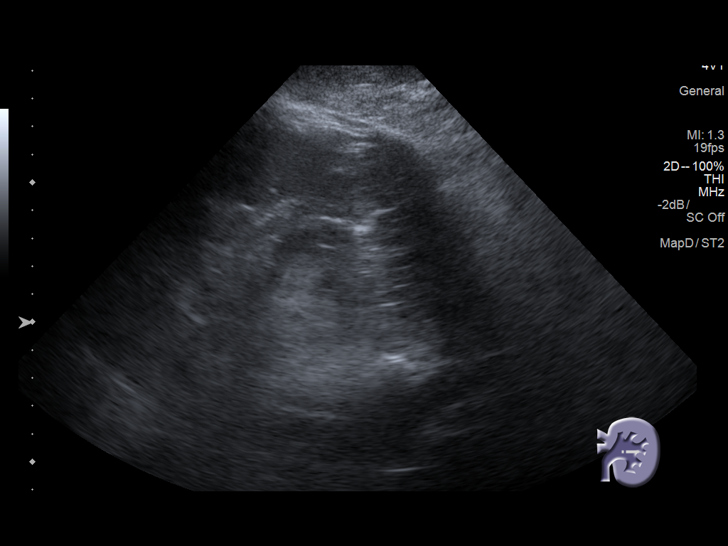
[im 34/51]
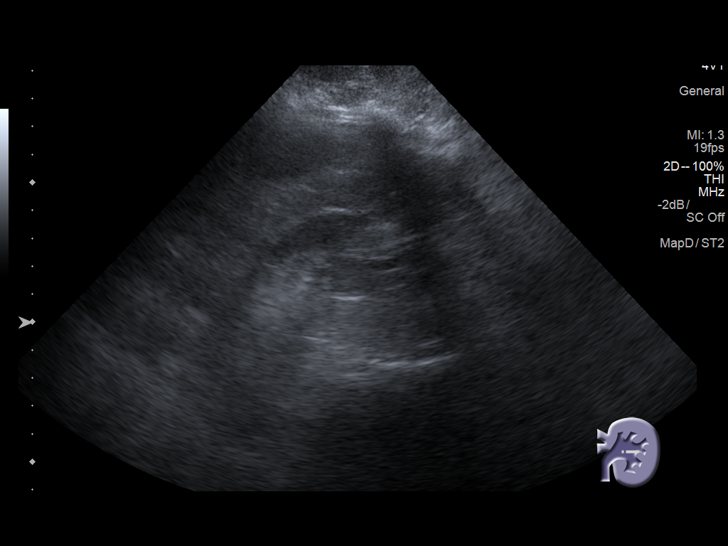
[im 38/51]
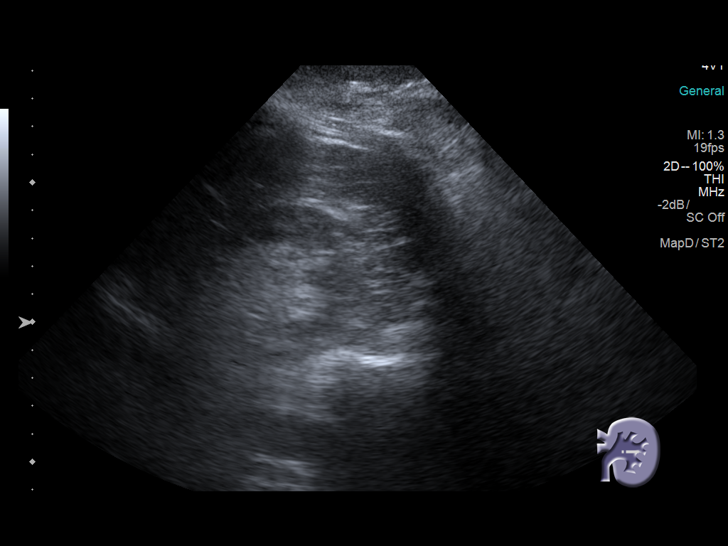
[im 42/51]
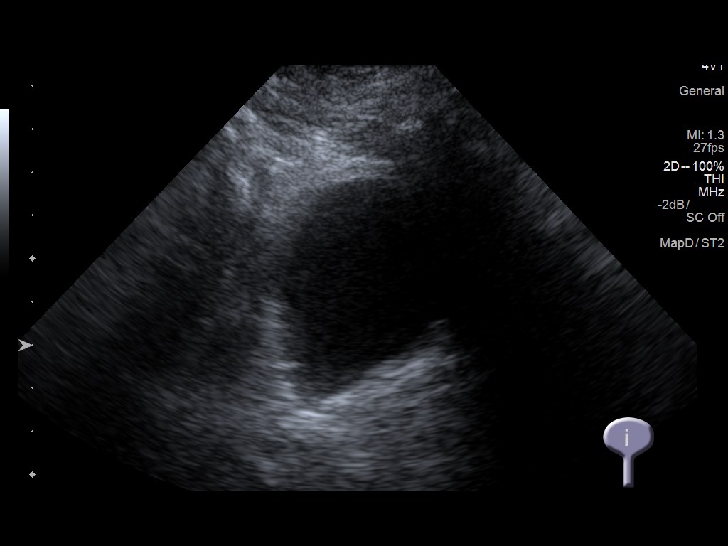
[im 46/51]
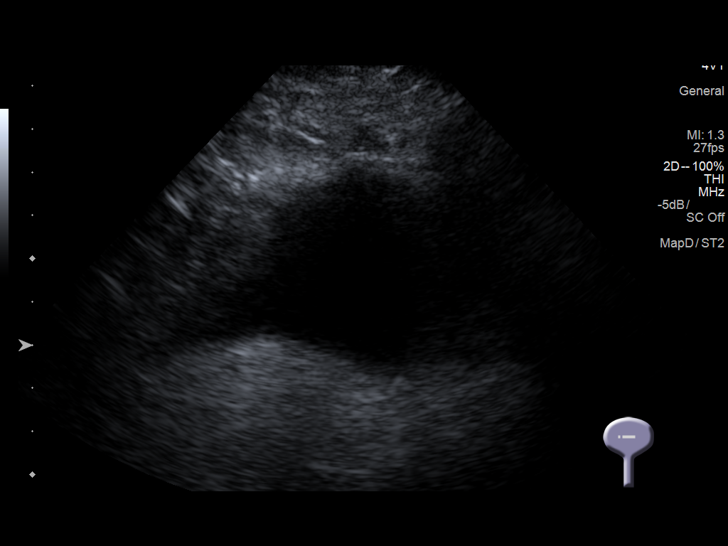
[im 51/51]
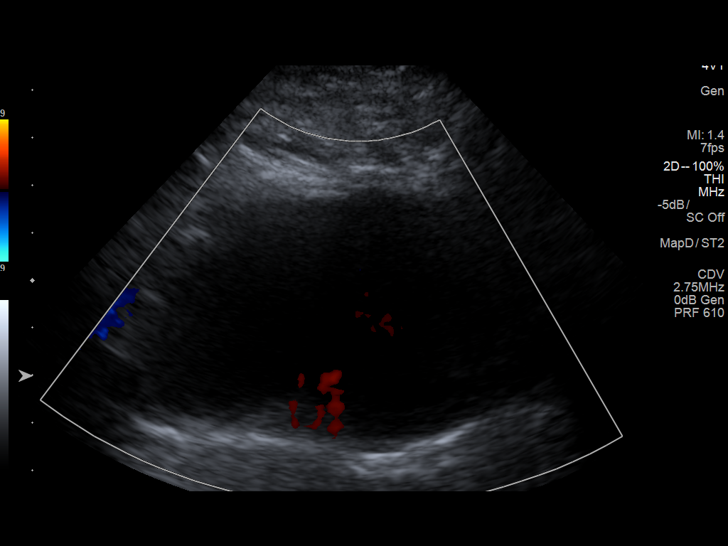

[14 of 25 positions shown; findings below may reference images not displayed]

FINDINGS: Right Kidney:

Length: 8.6 cm. Mild cortical atrophy. No hydronephrosis. No renal
mass.

Left Kidney:

Length: 10.8 cm. Echogenicity within normal limits. No mass or
hydronephrosis visualized.

Bladder:

Appears normal for degree of bladder distention.
IMPRESSION: No hydronephrosis.

Mildly atrophic right kidney.

## 2017-05-23 ENCOUNTER — Ambulatory Visit: Payer: Self-pay | Admitting: Pharmacist

## 2017-05-23 ENCOUNTER — Other Ambulatory Visit: Payer: Self-pay | Admitting: Pharmacist

## 2017-05-23 ENCOUNTER — Other Ambulatory Visit: Payer: Self-pay

## 2017-05-23 DIAGNOSIS — M542 Cervicalgia: Secondary | ICD-10-CM | POA: Diagnosis not present

## 2017-05-23 DIAGNOSIS — Z79891 Long term (current) use of opiate analgesic: Secondary | ICD-10-CM | POA: Diagnosis not present

## 2017-05-23 DIAGNOSIS — G894 Chronic pain syndrome: Secondary | ICD-10-CM | POA: Diagnosis not present

## 2017-05-23 DIAGNOSIS — M25512 Pain in left shoulder: Secondary | ICD-10-CM | POA: Diagnosis not present

## 2017-05-23 DIAGNOSIS — M545 Low back pain: Secondary | ICD-10-CM | POA: Diagnosis not present

## 2017-05-23 MED ORDER — LOSARTAN POTASSIUM 50 MG PO TABS: 50.0000 mg | ORAL_TABLET | Freq: Every day | ORAL | 1 refills | Status: DC

## 2017-05-23 MED ORDER — CLONIDINE HCL 0.3 MG PO TABS: ORAL_TABLET | ORAL | 1 refills | Status: DC

## 2017-05-23 MED ORDER — LEVOTHYROXINE SODIUM 50 MCG PO TABS: ORAL_TABLET | ORAL | 1 refills | Status: DC

## 2017-05-23 MED ORDER — ROSUVASTATIN CALCIUM 5 MG PO TABS: 5.0000 mg | ORAL_TABLET | Freq: Every day | ORAL | 1 refills | Status: DC

## 2017-05-23 MED ORDER — MONTELUKAST SODIUM 10 MG PO TABS: 10.0000 mg | ORAL_TABLET | Freq: Every day | ORAL | 1 refills | Status: DC

## 2017-05-23 NOTE — Patient Outreach (Signed)
[facility]) Care Management  05/23/2017  Russie Gulledge Bosler 06/11/1950 [id number]  Unsuccessful telephone call to Ms. Chieffo today.  I left a HIPPA compliant voicemail on the home and mobile phone.    Plan: I will follow-up with patient tomorrow regarding insulin management  Ralene Bathe, PharmD, [place] [phone]

## 2017-05-23 NOTE — Telephone Encounter (Signed)
meds sent to [facility] as requested

## 2017-05-24 ENCOUNTER — Ambulatory Visit: Payer: Self-pay | Admitting: Pharmacist

## 2017-05-24 ENCOUNTER — Encounter: Payer: Self-pay | Admitting: Family Medicine

## 2017-05-24 ENCOUNTER — Ambulatory Visit (INDEPENDENT_AMBULATORY_CARE_PROVIDER_SITE_OTHER): Payer: Medicare HMO | Admitting: Family Medicine

## 2017-05-24 ENCOUNTER — Other Ambulatory Visit: Payer: Self-pay | Admitting: Pharmacist

## 2017-05-24 ENCOUNTER — Ambulatory Visit: Payer: Self-pay | Admitting: Family Medicine

## 2017-05-24 VITALS — BP 118/64 | HR 64 | Resp 16 | Ht 61.0 in | Wt 157.0 lb

## 2017-05-24 DIAGNOSIS — E1165 Type 2 diabetes mellitus with hyperglycemia: Secondary | ICD-10-CM | POA: Diagnosis not present

## 2017-05-24 DIAGNOSIS — M79644 Pain in right finger(s): Secondary | ICD-10-CM

## 2017-05-24 DIAGNOSIS — Z9189 Other specified personal risk factors, not elsewhere classified: Secondary | ICD-10-CM | POA: Diagnosis not present

## 2017-05-24 DIAGNOSIS — T883XXD Malignant hyperthermia due to anesthesia, subsequent encounter: Secondary | ICD-10-CM

## 2017-05-24 DIAGNOSIS — R400 Somnolence: Secondary | ICD-10-CM

## 2017-05-24 DIAGNOSIS — G4733 Obstructive sleep apnea (adult) (pediatric): Secondary | ICD-10-CM

## 2017-05-24 DIAGNOSIS — Z794 Long term (current) use of insulin: Secondary | ICD-10-CM | POA: Diagnosis not present

## 2017-05-24 DIAGNOSIS — R21 Rash and other nonspecific skin eruption: Secondary | ICD-10-CM

## 2017-05-24 DIAGNOSIS — D649 Anemia, unspecified: Secondary | ICD-10-CM

## 2017-05-24 DIAGNOSIS — R0989 Other specified symptoms and signs involving the circulatory and respiratory systems: Secondary | ICD-10-CM

## 2017-05-24 NOTE — Patient Outreach (Signed)
[facility]) Care Management  05/24/2017  [name] December 01, 1950 [id number]   Patient's insulin dose recently reduced 05/10/2017 by endocrinologist, [name], due to severe hypoglycemic events.  Successful outreach call placed to [name] to follow up on hypoglycemia and insulin adjustments.  HIPAA identifiers verified.    [name] reports she saw her primary care doctor this morning and is doing well. She verbalized that she is taking Toujeo 20 units once daily and Novolog 10 units TID with meals.  She reports that her blood sugars are now 130s-150s.  She has not been able to buy glucose gel but states that she will buy it at her next trip to the store.  She requests that her appointment with [facility] RN next week be rescheduled.    Assessment:  Patient's blood sugars have improved and she is no longer experiencing any hypoglycemic events.  She recognizes that her new blood sugars are in a "safer" range. We reviewed that she has a follow-up appointment with [name] in 2 weeks.    Plan: I will close patient case at this time.  I will alert [facility] RN regarding appointment rescheduling next week.  I am happy to assist in the future as needed.   Ralene Bathe, PharmD, [place] [phone]

## 2017-05-24 NOTE — Patient Instructions (Addendum)
F/U mid to end May, call if you need me sooner  You are being referred to Hematologist re anemia  You are being referred to Dr Annamaria Boots re excessive sleepiness and bronchitis and sleep apnea   You need to see a heart specialist and I will refer you, especially because you report unstable blood pressure  Neck pain is being treated by pain specialist  Rash  On left arm will be treated by Dr Jerrell Belfast  Index finger has arthritis changes  BP is good today at 118/64 in the office Please look into going to the senior center for a  few hrs several days per week  Happy Birthday soon!!!

## 2017-05-25 ENCOUNTER — Ambulatory Visit (INDEPENDENT_AMBULATORY_CARE_PROVIDER_SITE_OTHER): Payer: Medicare HMO | Admitting: Podiatry

## 2017-05-25 ENCOUNTER — Other Ambulatory Visit: Payer: Self-pay | Admitting: *Deleted

## 2017-05-25 DIAGNOSIS — E1142 Type 2 diabetes mellitus with diabetic polyneuropathy: Secondary | ICD-10-CM | POA: Diagnosis not present

## 2017-05-25 DIAGNOSIS — B351 Tinea unguium: Secondary | ICD-10-CM | POA: Diagnosis not present

## 2017-05-25 NOTE — Patient Outreach (Signed)
Telephone call from pt stating she needs to change scheduled home visit planned for next week, routine home visit rescheduled for week of 06/04/17.  Jacqlyn Larsen [facility], [place] Coordinator [phone]

## 2017-05-28 DIAGNOSIS — Z87891 Personal history of nicotine dependence: Secondary | ICD-10-CM | POA: Diagnosis not present

## 2017-05-28 DIAGNOSIS — I509 Heart failure, unspecified: Secondary | ICD-10-CM | POA: Diagnosis not present

## 2017-05-28 DIAGNOSIS — R296 Repeated falls: Secondary | ICD-10-CM | POA: Diagnosis not present

## 2017-05-28 DIAGNOSIS — I69354 Hemiplegia and hemiparesis following cerebral infarction affecting left non-dominant side: Secondary | ICD-10-CM | POA: Diagnosis not present

## 2017-05-28 DIAGNOSIS — Z794 Long term (current) use of insulin: Secondary | ICD-10-CM | POA: Diagnosis not present

## 2017-05-28 DIAGNOSIS — F250 Schizoaffective disorder, bipolar type: Secondary | ICD-10-CM | POA: Diagnosis not present

## 2017-05-28 DIAGNOSIS — L739 Follicular disorder, unspecified: Secondary | ICD-10-CM | POA: Diagnosis not present

## 2017-05-28 DIAGNOSIS — I110 Hypertensive heart disease with heart failure: Secondary | ICD-10-CM | POA: Diagnosis not present

## 2017-05-28 DIAGNOSIS — J449 Chronic obstructive pulmonary disease, unspecified: Secondary | ICD-10-CM | POA: Diagnosis not present

## 2017-05-28 DIAGNOSIS — L668 Other cicatricial alopecia: Secondary | ICD-10-CM | POA: Diagnosis not present

## 2017-05-28 DIAGNOSIS — E1142 Type 2 diabetes mellitus with diabetic polyneuropathy: Secondary | ICD-10-CM | POA: Diagnosis not present

## 2017-05-29 ENCOUNTER — Ambulatory Visit: Payer: Self-pay | Admitting: *Deleted

## 2017-05-29 DIAGNOSIS — I69354 Hemiplegia and hemiparesis following cerebral infarction affecting left non-dominant side: Secondary | ICD-10-CM | POA: Diagnosis not present

## 2017-05-29 DIAGNOSIS — I110 Hypertensive heart disease with heart failure: Secondary | ICD-10-CM | POA: Diagnosis not present

## 2017-05-29 DIAGNOSIS — Z794 Long term (current) use of insulin: Secondary | ICD-10-CM | POA: Diagnosis not present

## 2017-05-29 DIAGNOSIS — Z87891 Personal history of nicotine dependence: Secondary | ICD-10-CM | POA: Diagnosis not present

## 2017-05-29 DIAGNOSIS — R296 Repeated falls: Secondary | ICD-10-CM | POA: Diagnosis not present

## 2017-05-29 DIAGNOSIS — I509 Heart failure, unspecified: Secondary | ICD-10-CM | POA: Diagnosis not present

## 2017-05-29 DIAGNOSIS — M25512 Pain in left shoulder: Secondary | ICD-10-CM | POA: Diagnosis not present

## 2017-05-29 DIAGNOSIS — F250 Schizoaffective disorder, bipolar type: Secondary | ICD-10-CM | POA: Diagnosis not present

## 2017-05-29 DIAGNOSIS — J449 Chronic obstructive pulmonary disease, unspecified: Secondary | ICD-10-CM | POA: Diagnosis not present

## 2017-05-29 DIAGNOSIS — E1142 Type 2 diabetes mellitus with diabetic polyneuropathy: Secondary | ICD-10-CM | POA: Diagnosis not present

## 2017-05-30 ENCOUNTER — Ambulatory Visit (INDEPENDENT_AMBULATORY_CARE_PROVIDER_SITE_OTHER): Payer: Medicare HMO | Admitting: Psychiatry

## 2017-05-30 DIAGNOSIS — F331 Major depressive disorder, recurrent, moderate: Secondary | ICD-10-CM

## 2017-05-30 NOTE — Progress Notes (Signed)
Patient:  [name]               DOB: 03-27-1950          MR Number:  [id number]  Location:   [place]:  [place].,              [place],  [place],    [place]          Start:  Wednesday 05/30/2017 1:20 PM End:       Wednesday 05/30/2017 2:02 PM    Provider/Observer:     Maurice Small, MSW, LCSW   Chief Complaint:                                                    Depression  Reason For Service:               [name] is a 67 y.o. female who presents with a long standing history of recurrent periods of depression beginning when she was 54 and her favorite uncle died. She has been experiencing increased symptoms of depression recently due to stress in the relationship with her son. Per patient's report, he rarely visits her anymore due to his involvement with an older woman who is controlling. She also reports frustration regarding her knees who is her power of attorn      ey and is very demanding per      patient's report. She reports issues with other family members and states she needs help dealing with her family Patient reports multiple psychiatric hospitlaizations due to depression and suicidal ideations with thel last one occuring in 1997. Patient has participated in outpatient psychotherapy and medication management intermittently since age 41.  She currently is seeing psychiatrist Dr. Harrington Challenger . Prior to this, she was being seen at [facility]. Patient also has had ECT at [facility].    Interventions Strategy:  Supportive  Participation Level:   Active      Participation Quality:  Appropriate      Behavioral Observation:  Casual, well groomed, speech very slow, drowsy   Current Psychosocial Factors: Tension in relationship with son, multiple health issues, concerns about grandson, discord with home health aide  Content of Session:   reviewed symptoms, discussed stressors, facilitated expression of thoughts and feelings regarding stressors,  discussed ways to  set and maintain boundaries in relationship with friend, discussed and used cognitive defusion technique to assist patient cope with ruminating thoughts about her health,   Current Status:   Fluctuations in mood, worry          Suicidal/Homicidal:    No   Patient Progress:   Patient last was seen about 4 weeks ago. She continues to experience multiple chronic health issues and pain since last session. She expresses frustration and excessive worry about continuing to have health issues and fears she will have other health issues. She also reports stress related to calls from a family friend who is constantly calling patient complaining and asking patient to take care of her business and financial matters. Patient expresses frustration as friend does not listen or take patient' advice.    Target Goals:   1.  Learn and implement behavioral strategies to overcome depression.    2.  Verbalize an understanding and resolution of current interpersonal problems.   Last  Reviewed:   10/13/2016  Goals Addressed Today:    2  Plan:      Return again in 2 - 3 weeks.  Impression/Diagnosis:   Patient presents with long standing history of recurrent periods  of depression beginning when she was [age] and her favorite uncle died. Patient reports multiple psychiatric hospitlaizations due to depression and suicidal ideations with thel last one occuring in 1997. Patient has participated in outpatient psychotherapy and medication management intermittently since age 66.  She currently is seeing psychiatrist Dr. Harrington Challenger . Prior to this, she was being seen at [facility]. Patient also has had ECT at [facility]. Symptoms have worsened in recent months due to family stress and have  included depressed mood, anxiety, excessive worry, and tearfulness.   Diagnosis:  Axis I: MDD recurrent episode, moderate          Axis II: Deferred     Keeleigh Terris, LCSW 05/30/2017

## 2017-05-31 ENCOUNTER — Other Ambulatory Visit: Payer: Self-pay

## 2017-05-31 ENCOUNTER — Emergency Department (HOSPITAL_COMMUNITY): Payer: Medicare HMO

## 2017-05-31 ENCOUNTER — Emergency Department (HOSPITAL_COMMUNITY)
Admission: EM | Admit: 2017-05-31 | Discharge: 2017-05-31 | Disposition: A | Discharge status: Home / Self Care | Payer: Medicare HMO | Attending: Emergency Medicine | Admitting: Emergency Medicine

## 2017-05-31 ENCOUNTER — Encounter (HOSPITAL_COMMUNITY): Payer: Self-pay | Admitting: Emergency Medicine

## 2017-05-31 DIAGNOSIS — E039 Hypothyroidism, unspecified: Secondary | ICD-10-CM | POA: Diagnosis not present

## 2017-05-31 DIAGNOSIS — Z87891 Personal history of nicotine dependence: Secondary | ICD-10-CM | POA: Insufficient documentation

## 2017-05-31 DIAGNOSIS — Z79899 Other long term (current) drug therapy: Secondary | ICD-10-CM | POA: Insufficient documentation

## 2017-05-31 DIAGNOSIS — Y92003 Bedroom of unspecified non-institutional (private) residence as the place of occurrence of the external cause: Secondary | ICD-10-CM | POA: Insufficient documentation

## 2017-05-31 DIAGNOSIS — Z7982 Long term (current) use of aspirin: Secondary | ICD-10-CM | POA: Insufficient documentation

## 2017-05-31 DIAGNOSIS — Y93E8 Activity, other personal hygiene: Secondary | ICD-10-CM | POA: Insufficient documentation

## 2017-05-31 DIAGNOSIS — S299XXA Unspecified injury of thorax, initial encounter: Secondary | ICD-10-CM | POA: Diagnosis not present

## 2017-05-31 DIAGNOSIS — E1142 Type 2 diabetes mellitus with diabetic polyneuropathy: Secondary | ICD-10-CM | POA: Insufficient documentation

## 2017-05-31 DIAGNOSIS — I10 Essential (primary) hypertension: Secondary | ICD-10-CM | POA: Insufficient documentation

## 2017-05-31 DIAGNOSIS — R0781 Pleurodynia: Secondary | ICD-10-CM | POA: Diagnosis not present

## 2017-05-31 DIAGNOSIS — S301XXA Contusion of abdominal wall, initial encounter: Secondary | ICD-10-CM | POA: Insufficient documentation

## 2017-05-31 DIAGNOSIS — Y999 Unspecified external cause status: Secondary | ICD-10-CM | POA: Insufficient documentation

## 2017-05-31 DIAGNOSIS — S3991XA Unspecified injury of abdomen, initial encounter: Secondary | ICD-10-CM | POA: Diagnosis present

## 2017-05-31 DIAGNOSIS — Z794 Long term (current) use of insulin: Secondary | ICD-10-CM | POA: Diagnosis not present

## 2017-05-31 DIAGNOSIS — R32 Unspecified urinary incontinence: Secondary | ICD-10-CM | POA: Diagnosis not present

## 2017-05-31 DIAGNOSIS — W010XXA Fall on same level from slipping, tripping and stumbling without subsequent striking against object, initial encounter: Secondary | ICD-10-CM | POA: Insufficient documentation

## 2017-05-31 DIAGNOSIS — J449 Chronic obstructive pulmonary disease, unspecified: Secondary | ICD-10-CM | POA: Diagnosis not present

## 2017-05-31 DIAGNOSIS — W19XXXA Unspecified fall, initial encounter: Secondary | ICD-10-CM

## 2017-05-31 DIAGNOSIS — M150 Primary generalized (osteo)arthritis: Secondary | ICD-10-CM | POA: Diagnosis not present

## 2017-05-31 DIAGNOSIS — I639 Cerebral infarction, unspecified: Secondary | ICD-10-CM | POA: Diagnosis not present

## 2017-05-31 DIAGNOSIS — R109 Unspecified abdominal pain: Secondary | ICD-10-CM | POA: Diagnosis not present

## 2017-05-31 MED ORDER — OXYCODONE-ACETAMINOPHEN 5-325 MG PO TABS
1.0000 | ORAL_TABLET | Freq: Once | ORAL | Status: AC
  Administered 2017-05-31: 1 via ORAL
  Filled 2017-05-31: qty 1

## 2017-05-31 MED ORDER — HYDROCODONE-ACETAMINOPHEN 5-325 MG PO TABS: ORAL_TABLET | ORAL | 0 refills | Status: DC

## 2017-05-31 NOTE — Discharge Instructions (Addendum)
Follow-up with your doctor next week if not improving °

## 2017-05-31 NOTE — ED Triage Notes (Signed)
Pt was getting on her bedroom shoes, slipped and fell.  Denies hitting head.  C/o of right upper abdominal pain.  VSS nad

## 2017-05-31 NOTE — ED Provider Notes (Signed)
[place] Provider Note   CSN: [id number] Arrival date & time: 05/31/17  1013     History   Chief Complaint Chief Complaint  Patient presents with  . Fall    HPI [name] is a 67 y.o. female.  Patient complains of right flank pain after she fell.  Patient did not hit her head  The history is provided by the patient. No language interpreter was used.  Fall  This is a new problem. The current episode started more than 2 days ago. The problem occurs rarely. The problem has been resolved. Pertinent negatives include no chest pain, no abdominal pain and no headaches. Nothing aggravates the symptoms.    Past Medical History:  Diagnosis Date  . Anemia   . Anxiety    takes Ativan daily  . Arthritis   . Bipolar disorder ([place])    takes Risperdal nightly  . Blood transfusion   . Cancer ([place])    In her gum  . Carpal tunnel syndrome of right wrist 05/23/2011  . Cervical disc disorder with radiculopathy of cervical region 10/31/2012  . Chronic back pain   . Chronic idiopathic constipation   . Colon polyps   . COPD (chronic obstructive pulmonary disease) with chronic bronchitis ([place]) 09/16/2013   Office Spirometry 10/30/2013-submaximal effort based on appearance of loop and curve. Numbers would fit with severe restriction but her physiologic capability may be better than this. FVC 0.91/44%, and 10.74/45%, FEV1/FVC 0.81, FEF 25-75% 1.43/69%    . Depression    takes Zoloft daily  . Diabetes mellitus    Type II  . Diverticulosis    TCS 9/08 by Dr. Delfin Edis for diarrhea . Bx for micro scopic colitis negative.   . Fibromyalgia   . Frequent falls   . GERD (gastroesophageal reflux disease)    takes Aciphex daily  . Glaucoma    eye drops daily  . Gum symptoms    infection on antibiotic  . Hemiplegia affecting non-dominant side, post-stroke 08/02/2011  . Hyperlipidemia    takes Crestor daily  . Hypertension    takes Amlodipine,Metoprolol,and Clonidine  daily  . Hypothyroidism    takes Synthroid daily  . IBS (irritable bowel syndrome)   . Insomnia    takes Trazodone nightly  . 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 Metabolic encephalopathy 3/71/6967  . Migraines    chronic headaches  . Mononeuritis lower limb   . Narcolepsy   . Osteoporosis   . Pancreatitis 2006   due to Depakote with normal EUS   . Schatzki's ring    non critical / EGD with ED 8/2011with RMR  . Seizures ([place])    takes Lamictal daily.Last seizure 3 yrs ago  . Sleep apnea    on CPAP  . Stroke [facility])    left sided weakness  . Tubular adenoma of colon     Patient Active Problem List   Diagnosis Date Noted  . 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 Rash and nonspecific skin eruption 04/25/2017  . Finger pain, right 01/28/2017  . Hypotension   . Diabetic polyneuropathy associated with type 2 diabetes mellitus ([place]) 05/19/2016  . Smoker 04/24/2016  . History of palpitations 08/09/2015  . Nausea without vomiting 08/09/2015  . Labile hypertension 08/03/2015  . Normal coronary arteries 08/03/2015  . Left-sided low back pain with left-sided sciatica 06/27/2015  . Left flank pain 06/27/2015  . Diabetes mellitus type 2 in obese ([place]) 05/06/2015  . Multinodular goiter 05/06/2015  . Rectocele,  female 04/27/2015  . Anal sphincter incontinence 04/27/2015  . Pelvic relaxation due to rectocele 03/30/2015  . Pulmonary hypertension ([place]) 02/22/2015  . Light cigarette smoker (1-9 cigarettes per day) 01/11/2015  . Migraine without aura and without status migrainosus, not intractable 07/02/2014  . Flatulence 02/18/2014  . Microcytic anemia 02/18/2014  . COPD (chronic obstructive pulmonary disease) with chronic bronchitis ([place]) 09/16/2013  . Acute head trauma 08/16/2013  . Hypothyroidism 08/16/2013  . Gastroparesis 04/28/2013  . Seizure disorder ([place]) 01/19/2013  . Cervical disc disorder with radiculopathy of cervical region 10/31/2012  . Solitary pulmonary nodule  08/19/2012  . Anemia 07/05/2012  . 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 Allergic sinusitis 04/18/2012  . Meningioma ([place]) 11/19/2011  . 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 Carpal tunnel syndrome of right wrist 05/23/2011  . Polypharmacy 04/28/2011  . Bipolar disorder ([place]) 04/28/2011  . Constipation 04/13/2011  . Falls frequently 12/12/2010  . Oropharyngeal dysphagia 07/12/2010  . Urinary incontinence 12/16/2009  . HEARING LOSS 10/26/2009  . Hyperlipidemia 12/11/2008  . IBS 12/11/2008  . GERD 07/29/2008  . MILK PRODUCTS ALLERGY 07/29/2008  . 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 Backache 06/19/2007  . Osteoporosis 06/19/2007  . Obstructive sleep apnea 06/19/2007  . TRIGGER FINGER 04/18/2007  . DIVERTICULOSIS, COLON 11/13/2006    Past Surgical History:  Procedure Laterality Date  . ABDOMINAL HYSTERECTOMY  1978  . BACK SURGERY  July 2012  . BACTERIAL OVERGROWTH TEST N/A 05/05/2013   Procedure: BACTERIAL OVERGROWTH TEST;  Surgeon: Daneil Dolin, MD;  [facility];  Service: Endoscopy;  Laterality: N/A;  7:30  . BIOPSY THYROID  2009  . BRAIN SURGERY  11/2011   resection of meningioma  . BREAST REDUCTION SURGERY  1994  . CARDIAC CATHETERIZATION  05/10/2005   normal coronaries, normal LV systolic function and EF (Dr. Jackie Plum)  . CARPAL TUNNEL RELEASE Left 07/22/04   Dr. Aline Brochure  . CATARACT EXTRACTION Bilateral   . CHOLECYSTECTOMY  1984  . COLONOSCOPY N/A 09/25/2012   [id number] diverticulosis.  colonic polyp-removed : tubular adenoma  . CRANIOTOMY  11/23/2011   Procedure: CRANIOTOMY TUMOR EXCISION;  Surgeon: Hosie Spangle, MD;  Location: [place] NEURO ORS;  Service: Neurosurgery;  Laterality: N/A;  Craniotomy for tumor resection  . ESOPHAGOGASTRODUODENOSCOPY  12/29/2010   Rourk-Retained food in the esophagus and stomach, small hiatal hernia, status post Maloney dilation of the esophagus  .  ESOPHAGOGASTRODUODENOSCOPY N/A 09/25/2012   [id number] atonic baggy esophagus status post Maloney dilation 63 F. Hiatal hernia  . GIVENS CAPSULE STUDY N/A 01/15/2013   NORMAL.   . IR GENERIC HISTORICAL  03/17/2016   IR RADIOLOGIST EVAL & MGMT 03/17/2016 [facility]-INTERV RAD  . LESION REMOVAL N/A 05/31/2015   Procedure: REMOVAL RIGHT AND LEFT LESIONS OF MANDIBLE;  Surgeon: Diona Browner, DDS;  Location: [place];  Service: Oral Surgery;  Laterality: N/A;  . MALONEY DILATION  12/29/2010   RMR;  . NM MYOCAR PERF WALL MOTION  2006   "relavtiely normal" persantine, mild anterior thinning (breast attenuation artifact), no region of scar/ischemia  . OVARIAN CYST REMOVAL    . RECTOCELE REPAIR N/A 06/29/2015   Procedure: POSTERIOR REPAIR (RECTOCELE);  Surgeon: Jonnie Kind, MD;  Location: [facility] ORS;  Service: Gynecology;  Laterality: N/A;  . REDUCTION MAMMAPLASTY Bilateral   . SPINE SURGERY  09/29/2010   Dr. Rolena Infante  . surgical excision of 3 tumors from right thigh and right buttock  and  left upper thigh  2010  . TOOTH EXTRACTION Bilateral 12/14/2014   Procedure: REMOVAL OF BILATERAL MANDIBULAR EXOSTOSES;  Surgeon: Diona Browner, DDS;  Location: [place];  Service: Oral Surgery;  Laterality: Bilateral;  . TRANSTHORACIC ECHOCARDIOGRAM  2010   EF 60-65%, mild conc LVH, grade 1 diastolic dysfunction; mildly calcified MV annulus with mildly thickened leaflets, mildly calcified MR annulus    OB History    Gravida  6   Para  1   Term  1   Preterm      AB  5   Living        SAB  5   TAB      Ectopic      Multiple      Live Births               Home Medications    Prior to Admission medications   Medication Sig Start Date End Date Taking? Authorizing Provider  ACCU-CHEK SMARTVIEW test strip TEST BLOOD SUGAR FOUR TIMES DAILY AS DIRECTED 05/08/17  Yes Philemon Kingdom, MD  albuterol (PROVENTIL HFA;VENTOLIN HFA) 108 (90 Base) MCG/ACT inhaler Inhale 1-2 puffs every 6 hours as needed for wheezing,  shortness of breath 07/27/16  Yes Young, Tarri Fuller D, MD  ascorbic acid (VITAMIN C) 500 MG tablet Take 500 mg by mouth daily.   Yes [provider]  aspirin EC 81 MG tablet Take 81 mg by mouth daily.   Yes [provider]  Cholecalciferol (VITAMIN D3) 5000 units CAPS Take 1 capsule by mouth daily.   Yes [provider]  cloNIDine (CATAPRES) 0.3 MG tablet TAKE 1 TABLET BY MOUTH EVERY EIGHT HOURS. ONCE AT 8AM, 4PM, AND 12 MIDNIGHT. 05/23/17  Yes [name], MD  hydroxychloroquine (PLAQUENIL) 200 MG tablet Take 200 mg by mouth daily.    Yes [provider]  insulin aspart (NOVOLOG FLEXPEN) 100 UNIT/ML FlexPen INJECT 17 TO 24 UNITS INTO THE SKIN 3 (THREE) TIMES DAILY WITH MEALS. 11/06/16  Yes Philemon Kingdom, MD  Insulin Glargine (TOUJEO SOLOSTAR) 300 UNIT/ML SOPN Inject 30 Units into the skin at bedtime. Patient taking differently: Inject 34 Units into the skin at bedtime.  11/06/16  Yes Philemon Kingdom, MD  lamoTRIgine (LAMICTAL) 100 MG tablet TAKE 1 TABLET BY MOUTH TWICE DAILY. 04/02/17  Yes Pieter Partridge, DO  levothyroxine (SYNTHROID, LEVOTHROID) 50 MCG tablet One tab once daily and one half tab on sunday Patient taking differently: Take 50 mcg by mouth daily before breakfast. One tab once daily and one half tab on sunday 05/23/17  Yes [name], MD  LORazepam (ATIVAN) 0.5 MG tablet Take 1 tablet (0.5 mg total) by mouth 3 (three) times daily. 04/30/17 04/30/18 Yes Cloria Spring, MD  losartan (COZAAR) 50 MG tablet Take 1 tablet (50 mg total) by mouth daily. 05/23/17  Yes [name], MD  metoprolol tartrate (LOPRESSOR) 50 MG tablet TAKE 1 TABLET BY MOUTH TWICE DAILY. 05/10/17  Yes Hilty, Nadean Corwin, MD  montelukast (SINGULAIR) 10 MG tablet Take 1 tablet (10 mg total) by mouth daily. 05/23/17  Yes [name], MD  Multiple Vitamins-Minerals (HM MULTIVITAMIN ADULT GUMMY PO) Take 1 tablet by mouth daily.   Yes [provider]    pregabalin (LYRICA) 75 MG capsule Take 1 capsule (75 mg total) by mouth daily. 10/20/16  Yes Rexene Alberts, MD  RABEprazole (ACIPHEX) 20 MG tablet TAKE 1 TABLET BY MOUTH TWICE DAILY. 04/02/17  Yes Pyrtle, Lajuan Lines, MD  RESTASIS 0.05 % ophthalmic emulsion Place 1 drop into both eyes 2 (two) times daily.  02/04/14  Yes [provider]  risperiDONE (RISPERDAL) 0.5 MG tablet Take 1 tablet (0.5 mg total) by mouth at bedtime. 04/30/17  Yes Cloria Spring, MD  rosuvastatin (CRESTOR) 5 MG tablet Take 1 tablet (5 mg total) by mouth at bedtime. 05/23/17  Yes [name], MD  sertraline (ZOLOFT) 100 MG tablet Take 1 tablet (100 mg total) by mouth daily. 04/30/17  Yes Cloria Spring, MD  traMADol (ULTRAM) 50 MG tablet Take 1 tablet (50 mg total) by mouth every 6 (six) hours as needed. Patient taking differently: Take 50 mg by mouth 3 (three) times daily as needed for moderate pain.  04/03/17  Yes Julianne Rice, MD  traZODone (DESYREL) 100 MG tablet Take 1 tablet (100 mg total) by mouth at bedtime. Patient taking differently: Take 200 mg by mouth at bedtime.  04/30/17  Yes Cloria Spring, MD  HYDROcodone-acetaminophen (NORCO/VICODIN) 5-325 MG tablet Take 1 pill every 4-6 hours for pain not relieved by your normal pain medicine 05/31/17   Milton Ferguson, MD    Family History Family History  Problem Relation Age of Onset  . Heart attack Mother        HTN  . Pneumonia Father   . Kidney failure Father   . Diabetes Father   . Pancreatic cancer Sister   . Diabetes Brother   . Hypertension Brother   . Diabetes Brother   . Cancer Sister        breast   . Hypertension Son   . Sleep apnea Son   . Cancer Sister        pancreatic  . Stroke Maternal Grandmother   . Heart attack Maternal Grandfather   . Alcohol abuse Maternal Uncle   . Colon cancer Neg Hx   . Anesthesia problems Neg Hx   . Hypotension Neg Hx   . Malignant hyperthermia Neg Hx   . Pseudochol deficiency Neg Hx     Social  History Social History   Tobacco Use  . Smoking status: Former Smoker    Packs/day: 0.25    Years: 7.00    Pack years: 1.75    Types: Cigarettes    Last attempt to quit: 03/13/2017    Years since quitting: 0.2  . Smokeless tobacco: Never Used  . Tobacco comment: continues to smoke - a little less than a 1/4 pack per da as of 05/01/2017  Substance Use Topics  . Alcohol use: No    Alcohol/week: 0.0 oz    Comment:    . Drug use: No     Allergies   Cephalexin; Iron; Milk-related compounds; Penicillins; and Phenazopyridine hcl   Review of Systems Review of Systems  Constitutional: Negative for appetite change and fatigue.  HENT: Negative for congestion, ear discharge and sinus pressure.   Eyes: Negative for discharge.  Respiratory: Negative for cough.   Cardiovascular: Negative for chest pain.  Gastrointestinal: Negative for abdominal pain and diarrhea.  Genitourinary: Negative for frequency and hematuria.  Musculoskeletal: Negative for back pain.  Skin: Negative for rash.  Neurological: Negative for seizures and headaches.  Psychiatric/Behavioral: Negative for hallucinations.     Physical Exam Updated Vital Signs BP (!) 156/66   Pulse 61   Temp 98.4 F (36.9 C) (Oral)   Resp 18   Ht 5\' 1"  (1.549 m)   Wt 71.2 kg (157 lb)   SpO2 98%   BMI  29.66 kg/m   Physical Exam  Constitutional: She is oriented to person, place, and time. She appears well-developed.  HENT:  Head: Normocephalic.  Eyes: Conjunctivae and EOM are normal. No scleral icterus.  Neck: Neck supple. No thyromegaly present.  Cardiovascular: Normal rate and regular rhythm. Exam reveals no gallop and no friction rub.  No murmur heard. Pulmonary/Chest: No stridor. She has no wheezes. She has no rales. She exhibits no tenderness.  Abdominal: She exhibits no distension. There is no tenderness. There is no rebound.  Genitourinary:  Genitourinary Comments: Tender right flank  Musculoskeletal: Normal range of  motion. She exhibits no edema.  Lymphadenopathy:    She has no cervical adenopathy.  Neurological: She is oriented to person, place, and time. She exhibits normal muscle tone. Coordination normal.  Skin: No rash noted. No erythema.  Psychiatric: She has a normal mood and affect. Her behavior is normal.     ED Treatments / Results  Labs (all labs ordered are listed, but only abnormal results are displayed) Labs Reviewed - No data to display  EKG  EKG Interpretation None       Radiology Dg Ribs Unilateral W/chest Right  Result Date: 05/31/2017 CLINICAL DATA:  Right posterior lower rib pain following a fall this morning. Ex-smoker. EXAM: RIGHT RIBS AND CHEST - 3+ VIEW COMPARISON:  05/19/2017. FINDINGS: Normal sized heart. Clear lungs. No fracture or pneumothorax seen. Thoracic spine degenerative changes and mild scoliosis. Cholecystectomy clips. IMPRESSION: No fracture or other acute abnormality. Electronically Signed   By: Claudie Revering M.D.   On: 05/31/2017 11:45   Ct Renal Stone Study  Result Date: 05/31/2017 CLINICAL DATA:  Right flank pain.  Fall this morning. EXAM: CT ABDOMEN AND PELVIS WITHOUT CONTRAST TECHNIQUE: Multidetector CT imaging of the abdomen and pelvis was performed following the standard protocol without IV contrast. COMPARISON:  CT abdomen pelvis dated December 16, 2016. FINDINGS: Lower chest: Scarring in the lingula is slightly more nodular in appearance when compared to prior study, but overall similar in size. No acute abnormality. Coronary artery disease. Hepatobiliary: No focal liver abnormality is seen. No gallstones, gallbladder wall thickening, or biliary dilatation. Pancreas: Unremarkable. No pancreatic ductal dilatation or surrounding inflammatory changes. Spleen: Normal in size without focal abnormality. Adrenals/Urinary Tract: Adrenal glands are unremarkable. Kidneys are normal, without renal calculi, focal lesion, or hydronephrosis. Bladder is unremarkable.  Stomach/Bowel: Stomach is within normal limits. Appendix appears normal. No evidence of bowel wall thickening, distention, or inflammatory changes. Mild colonic diverticulosis. Vascular/Lymphatic: Aortic atherosclerosis. No enlarged abdominal or pelvic lymph nodes. Reproductive: Status post hysterectomy. No adnexal masses. Other: No abdominal wall hernia or abnormality. No abdominopelvic ascites. No pneumoperitoneum. Musculoskeletal: Moderate right flank subcutaneous soft tissue stranding and small hematoma. No acute or significant osseous findings. Old healed left lateral seventh rib fracture. Moderate to severe degenerative disc disease at L4-L5 and L5-S1 status post prior posterior decompression, unchanged. IMPRESSION: 1. Moderate right flank subcutaneous soft tissue stranding with small hematoma, possibly related to recent trauma given fall earlier today. 2. No acute osseous abnormality. 3. No renal or ureteral calculi. 4. Scarring in the lingula slightly more nodular in appearance when compared to prior study, but overall similar in size, measuring 7 mm. Recommend follow-up chest CT in 6-12 months to evaluate for interval change. Electronically Signed   By: Titus Dubin M.D.   On: 05/31/2017 17:07    Procedures Procedures (including critical care time)  Medications Ordered in ED Medications  oxyCODONE-acetaminophen (PERCOCET/ROXICET) 5-325 MG per tablet  1 tablet (1 tablet Oral Given 05/31/17 1745)     Initial Impression / Assessment and Plan / ED Course  I have reviewed the triage vital signs and the nursing notes.  Pertinent labs & imaging results that were available during my care of the patient were reviewed by me and considered in my medical decision making (see chart for details).     X-rays reviewed.  Patient has contusion to right flank area.  She is given some Vicodin and will follow up with PCP  Final Clinical Impressions(s) / ED Diagnoses   Final diagnoses:  Fall, initial  encounter    ED Discharge Orders        Ordered    HYDROcodone-acetaminophen (NORCO/VICODIN) 5-325 MG tablet     05/31/17 1751       Milton Ferguson, MD 05/31/17 Johnnye Lana

## 2017-05-31 NOTE — ED Notes (Signed)
Patient states fell while putting on shoes, feeling "bruised" on right lower rib cage.  No bruising or swelling noted. Patient resting on left side.

## 2017-06-01 ENCOUNTER — Other Ambulatory Visit: Payer: Self-pay | Admitting: Internal Medicine

## 2017-06-01 ENCOUNTER — Telehealth: Payer: Self-pay | Admitting: Pharmacist

## 2017-06-01 ENCOUNTER — Other Ambulatory Visit: Payer: Self-pay | Admitting: Family Medicine

## 2017-06-01 ENCOUNTER — Other Ambulatory Visit (HOSPITAL_COMMUNITY): Payer: Self-pay | Admitting: Psychiatry

## 2017-06-01 NOTE — Patient Outreach (Signed)
[place] [facility]) Care Management  06/01/2017  [name] 09/20/1950 [id number]   Message received from [place] that [name] was returning a call from a [facility] care team member.    Successful call placed to [name]. HIPAA identifiers verified. [name] reports that she is trying to return a cal from a pharmacist but is not sure which pharmacist called her.  She has accidentally deleted the voicemail with the callback information.  I provided pharmacy number for [facility] which is patient's primary pharmacy.  Patient voiced understanding.    Plan: No new medication concerns relayed from patient.  I will keep patient case closed at this time.   Ralene Bathe, PharmD, [place] [phone]

## 2017-06-01 NOTE — Telephone Encounter (Signed)
Rx has been sent to the pharmacy electronically. ° °

## 2017-06-03 IMAGING — US US SOFT TISSUE HEAD/NECK
1 series · 13 of 25 positions shown · non-contrast
Comparison: Thyroid ultrasound - 01/20/2014 ; 10/11/2012

CLINICAL DATA: Follow-up thyroid nodules.

EXAM:
THYROID ULTRASOUND
TECHNIQUE: Ultrasound examination of the thyroid gland and adjacent soft
tissues was performed.

[Series 1: us soft tissue head/neck · 0.09mm/px · 13 of 65 slices shown]
[im 1/65]
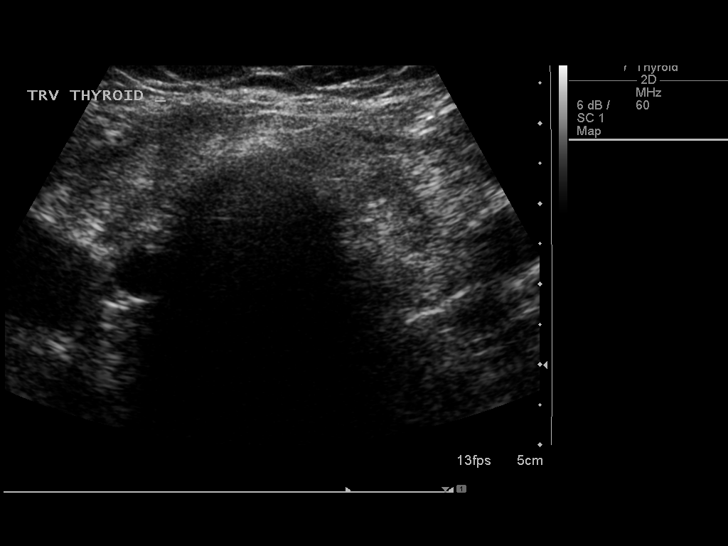
[im 6/65]
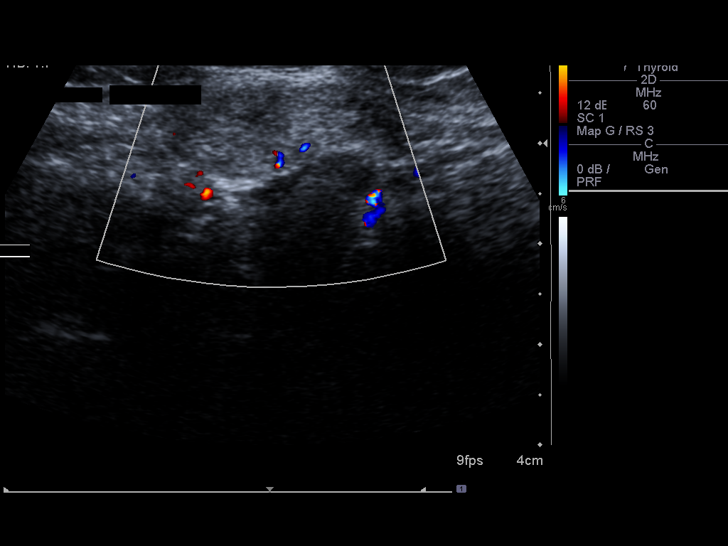
[im 11/65]
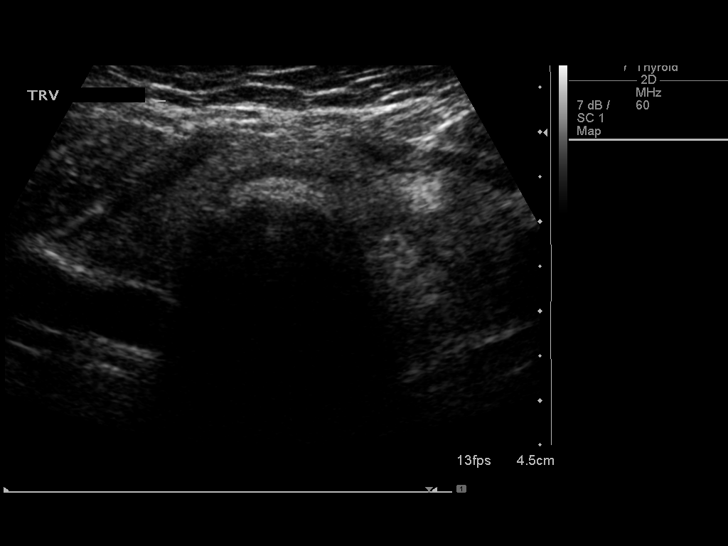
[im 17/65]
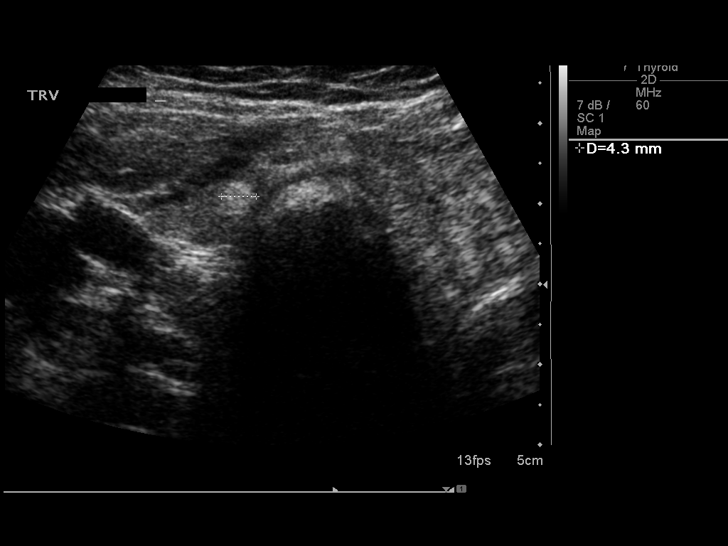
[im 22/65]
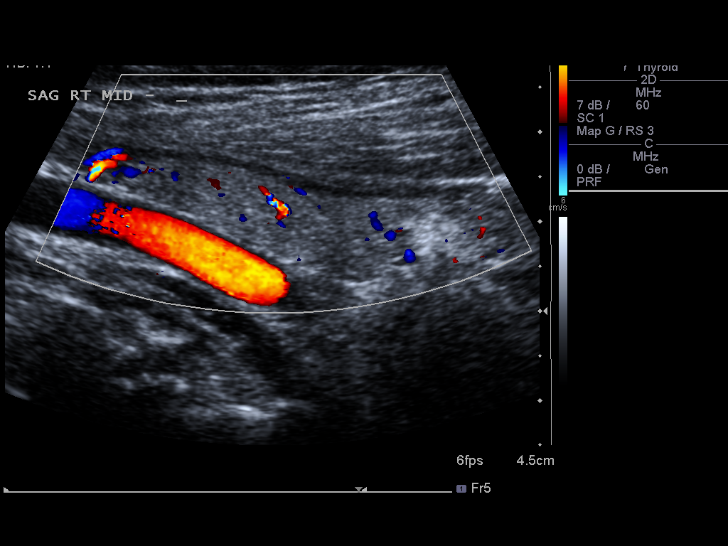
[im 27/65]
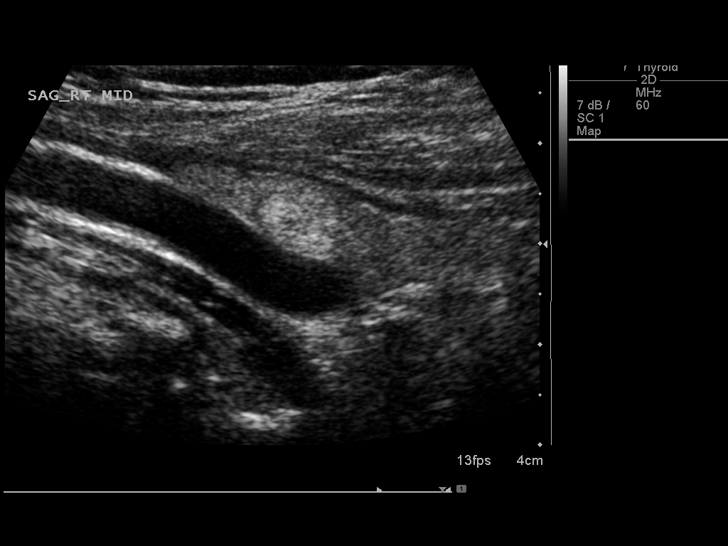
[im 33/65]
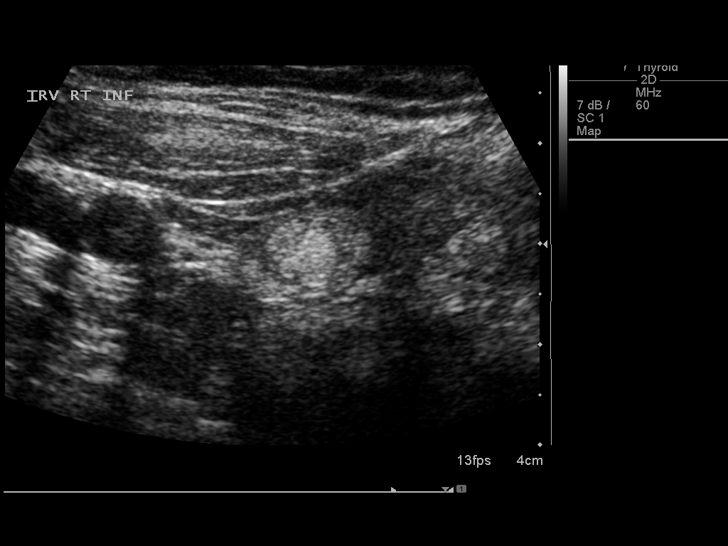
[im 38/65]
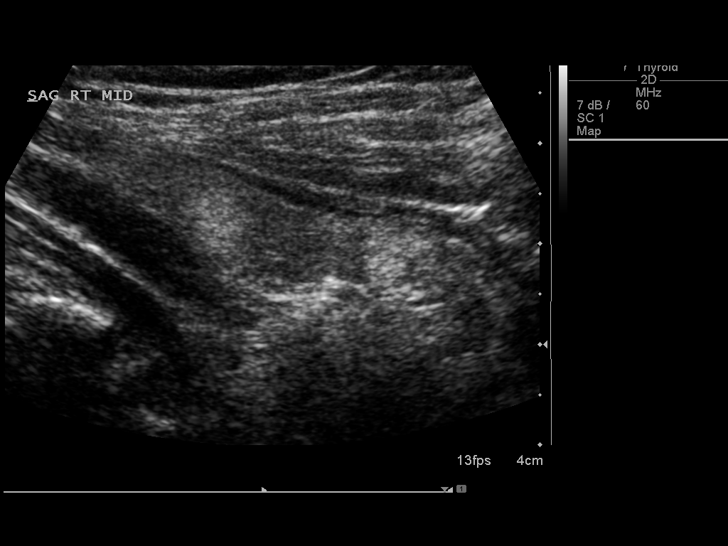
[im 43/65]
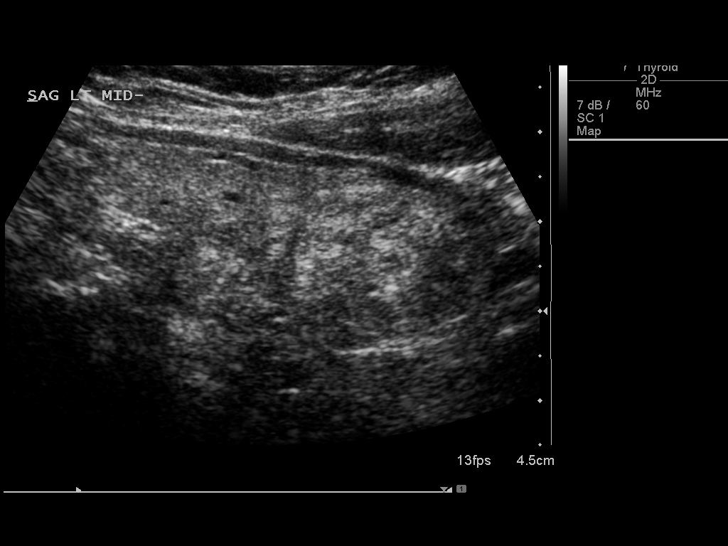
[im 49/65]
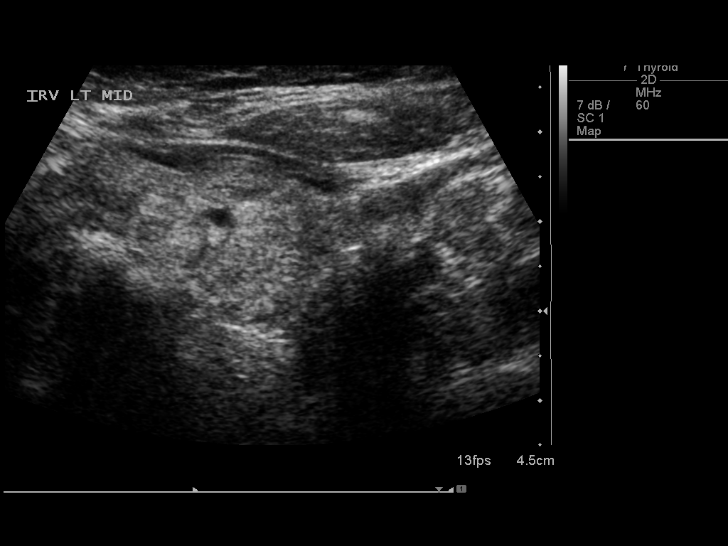
[im 54/65]
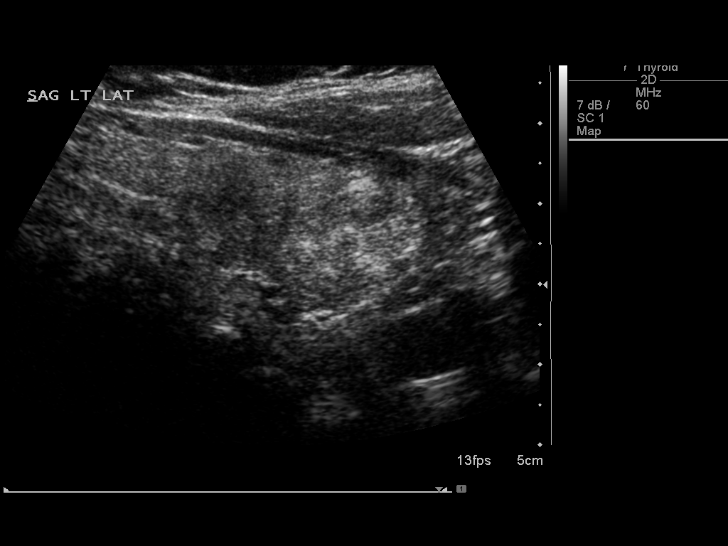
[im 59/65]
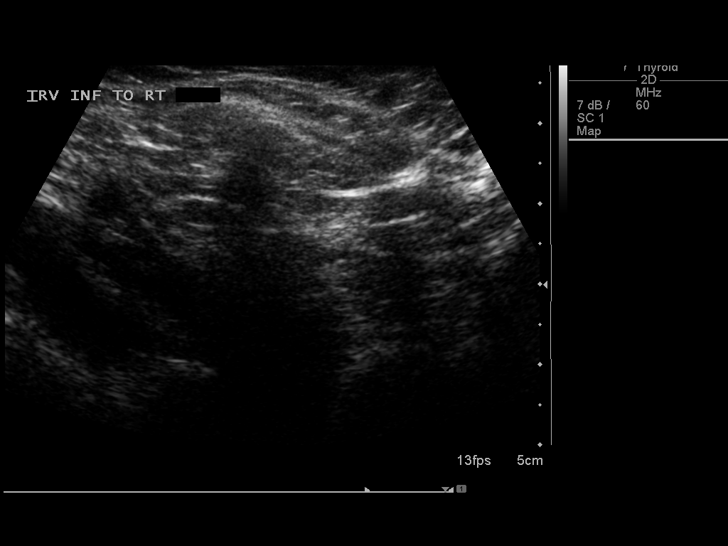
[im 65/65]
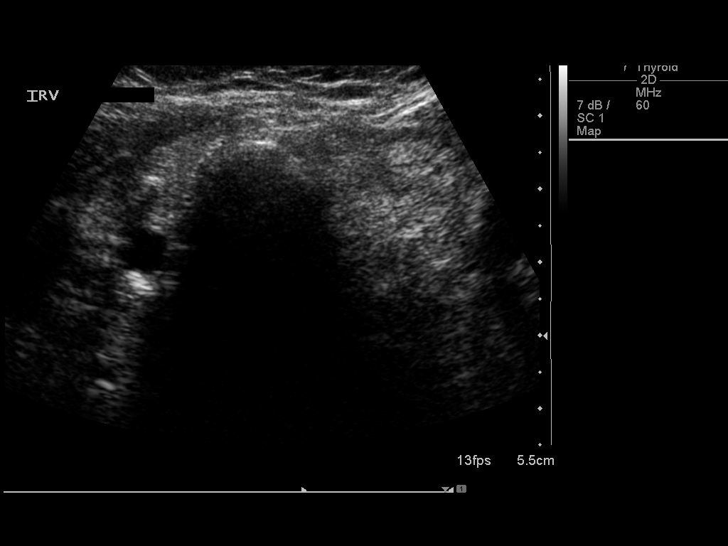

[13 of 25 positions shown; findings below may reference images not displayed]

FINDINGS: Parenchymal Echotexture: Moderately heterogenous

Estimated total number of nodules > 1 cm: <5

Number of spongiform nodules > 2 cm not described below (TR1): 0

Number of mixed cystic nodules > 1.5 cm not described below (TR2): 0

_________________________________________________________

Isthmus: 0.7 cm

Nodule # 1:

Prior biopsy: No

Location: Isthmus; left

Size: 1.5 x 1.3 x 1.5 cm, previously 2.2 x 1.4 x 1.8 cm

Composition: solid/almost completely solid (2)

Echogenicity: hyperechoic (1)

Shape: not taller-than-wide (0)

Margins: ill-defined (0)

Echogenic foci: none (0)

ACR TI-RADS total points: 3.

ACR TI-RADS risk category: TR3 (3 points).

Change in features: Yes. Increased internal cystic degeneration and
smaller in size.

Change in ACR TI-RADS risk category: No

ACR TI-RADS recommendations:

*Given size (1.5 - 2.4 cm) and appearance, a follow-up ultrasound in
1 year is recommended based on TI-RADS criteria as clinically
indicated.

_________________________________________________________

Right lobe: 4.6 x 1.1 x 1.9 cm, previously 4.4 x 1.2 x 1.7 cm

Stable scattered subcentimeter echogenic nodules

_________________________________________________________

Left lobe: 4.8 x 2.1 x 1.9 cm, previously 5.1 x 2.1 x 2.3 cm

Nodule # 1:

Prior biopsy: No

Location: Left; Inferior

Size: 2.4 x 1.7 x 1.9 cm, previously 2.5 x 1.7 x

Composition: solid/almost completely solid (2)

Echogenicity: hyperechoic (1)

Shape: not taller-than-wide (0)

Margins: ill-defined (0)

Echogenic foci: none (0)

ACR TI-RADS total points: 3.

ACR TI-RADS risk category: TR3 (3 points).

Change in features: No

Change in ACR TI-RADS risk category: No

ACR TI-RADS recommendations:

*Given size (1.5 - 2.4 cm) and appearance, a follow-up ultrasound in
1 year is recommended based on TI-RADS criteria as clinically
indicated.
IMPRESSION: TR 3 isthmic and left thyroid nodules unchanged. *Given size (1.5 -
2.4 cm) and appearance of both thyroid nodules, a follow-up
ultrasound in 1 year is recommended based on TI-RADS criteria as
clinically indicated.

The above is in keeping with the ACR TI-RADS recommendations - [HOSPITAL] 4984;[DATE].

## 2017-06-04 ENCOUNTER — Telehealth: Payer: Self-pay

## 2017-06-04 DIAGNOSIS — R296 Repeated falls: Secondary | ICD-10-CM | POA: Diagnosis not present

## 2017-06-04 DIAGNOSIS — J449 Chronic obstructive pulmonary disease, unspecified: Secondary | ICD-10-CM | POA: Diagnosis not present

## 2017-06-04 DIAGNOSIS — I69354 Hemiplegia and hemiparesis following cerebral infarction affecting left non-dominant side: Secondary | ICD-10-CM | POA: Diagnosis not present

## 2017-06-04 DIAGNOSIS — F250 Schizoaffective disorder, bipolar type: Secondary | ICD-10-CM | POA: Diagnosis not present

## 2017-06-04 DIAGNOSIS — E1142 Type 2 diabetes mellitus with diabetic polyneuropathy: Secondary | ICD-10-CM | POA: Diagnosis not present

## 2017-06-04 DIAGNOSIS — I110 Hypertensive heart disease with heart failure: Secondary | ICD-10-CM | POA: Diagnosis not present

## 2017-06-04 DIAGNOSIS — Z794 Long term (current) use of insulin: Secondary | ICD-10-CM | POA: Diagnosis not present

## 2017-06-04 DIAGNOSIS — I509 Heart failure, unspecified: Secondary | ICD-10-CM | POA: Diagnosis not present

## 2017-06-04 DIAGNOSIS — Z87891 Personal history of nicotine dependence: Secondary | ICD-10-CM | POA: Diagnosis not present

## 2017-06-04 NOTE — Telephone Encounter (Signed)
-----   Message from Philemon Kingdom, MD sent at 05/14/2017  4:39 PM EST ----- C, Can you please call pt: - increase Toujeo: 20 >> 24 units - Novolog:  10units with a regular meal meal (use this preferentially)  14units with a large meal Please call us back in 2-3 days with the sugars. TY, C ----- Message ----- From: Rudean Haskell, [facility] Sent: 05/14/2017   2:57 PM To: Philemon Kingdom, MD  [name], I spoke with Ms. Wynelle Cleveland today regarding her insulin.  She has reduced her Toujeo to 20 units however is taking Novolog 14 units with each meal rather than the sliding scale recommended.  CBGs today were 194 and  77.  I will follow-up with her again next week to review insulin doses.   Thanks, Ralene Bathe, PharmD, [place] [phone]

## 2017-06-04 NOTE — Telephone Encounter (Signed)
Called pt to confirm she received previous med instructions. Patient stated that she was aware of Dr. Synthia [name] med instructions and her b/s have been "ok" since change. Confirmed appt w/ pt on 03/28.

## 2017-06-06 ENCOUNTER — Other Ambulatory Visit: Payer: Self-pay | Admitting: *Deleted

## 2017-06-06 ENCOUNTER — Encounter: Payer: Self-pay | Admitting: *Deleted

## 2017-06-06 NOTE — Patient Outreach (Signed)
RN CM called pt to remind of today's scheduled home visit and that RN CM on the way, RN CM drove to patient's home, no answer to locked door, no answer to telephone. Pt called RN CM later in the day, reports her door bell does not work and she did not hear RN CM.  Pt agreeable to telephone assessment and see pt for home visit in 2 weeks.  Pt reports CBG readings 100's range with "some in 200's at times"  Home health continues working with pt, pt states she has all medications and taking as prescribed, pt reports she has had " issues with my hand and I'm making appointment with orthopedic dr".  Pt sounds drowsy during phone conversation.  THN CM Care Plan Problem One     Most Recent Value  Care Plan Problem One  Pt high risk for falls  Role Documenting the Problem One  Care Management [place] for Problem One  Active  THN Long Term Goal   Pt will have no falls and demonstrate safety precautions within 60 days  THN Long Term Goal Start Date  06/06/17 [goal re-established, pt had fall 05/31/17]  Interventions for Problem One Long Term Goal  Pt reports she had a fall at home in bedroom and bruised her side, went to ED, sent back home, pt states her CNA was with her at the time, pt states " I kind of slipped"  Pt reports still has soreness  THN CM Short Term Goal #1   Pt will work with home health PT and complete prescribed exercises within 30 days.  THN CM Short Term Goal #1 Start Date  06/06/17 [goal re-established]  Interventions for Short Term Goal #1  Pt continues working with home health PT, working on safety , endurance, strengthening  THN CM Short Term Goal #2   Pt will verbalize/ demonstrate safety precautions within 30 days  THN CM Short Term Goal #2 Start Date  06/06/17  Interventions for Short Term Goal #2  RN CM reviewed safety precautions, using assistive devices, always asking for assistance as needed, pt has CNA that assists daily, ask pt not to ambulate if dizzy    THN CM  Care Plan Problem Two     Most Recent Value  Care Plan Problem Two  Pain not effectively controlled  Role Documenting the Problem Two  Care Management [place] for Problem Two  Active  THN CM Short Term Goal #1   Pt will verbalize pain management strategies within 30 days.  THN CM Short Term Goal #1 Start Date  06/06/17 [goal re-established]  Interventions for Short Term Goal #2   Pt wants to continue working towards goal, pt has pain from fall (abdomen, soreness), RN CM reviewed relaxation techniques, taking pain medication as prescribed and to have ongoing discussion with MD for unrelieved pain.      PLAN See pt for home visit in 2 weeks  Jacqlyn Larsen [facility], [place] Coordinator [phone]

## 2017-06-07 ENCOUNTER — Other Ambulatory Visit (HOSPITAL_COMMUNITY): Payer: Self-pay | Admitting: Psychiatry

## 2017-06-07 ENCOUNTER — Encounter: Payer: Self-pay | Admitting: Internal Medicine

## 2017-06-07 ENCOUNTER — Ambulatory Visit (INDEPENDENT_AMBULATORY_CARE_PROVIDER_SITE_OTHER): Payer: Medicare HMO | Admitting: Internal Medicine

## 2017-06-07 VITALS — BP 174/68 | HR 74 | Ht 61.0 in | Wt 152.2 lb

## 2017-06-07 DIAGNOSIS — Z794 Long term (current) use of insulin: Secondary | ICD-10-CM | POA: Diagnosis not present

## 2017-06-07 DIAGNOSIS — E669 Obesity, unspecified: Secondary | ICD-10-CM

## 2017-06-07 DIAGNOSIS — E039 Hypothyroidism, unspecified: Secondary | ICD-10-CM | POA: Diagnosis not present

## 2017-06-07 DIAGNOSIS — E042 Nontoxic multinodular goiter: Secondary | ICD-10-CM | POA: Diagnosis not present

## 2017-06-07 DIAGNOSIS — E1169 Type 2 diabetes mellitus with other specified complication: Secondary | ICD-10-CM | POA: Diagnosis not present

## 2017-06-07 DIAGNOSIS — E1165 Type 2 diabetes mellitus with hyperglycemia: Secondary | ICD-10-CM

## 2017-06-07 LAB — POCT GLYCOSYLATED HEMOGLOBIN (HGB A1C): HEMOGLOBIN A1C: 5.8

## 2017-06-07 MED ORDER — INSULIN GLARGINE 300 UNIT/ML ~~LOC~~ SOPN: 20.0000 [IU] | PEN_INJECTOR | Freq: Every day | SUBCUTANEOUS | 3 refills | Status: DC

## 2017-06-07 MED ORDER — INSULIN PEN NEEDLE 32G X 4 MM MISC: 3 refills | Status: DC

## 2017-06-07 MED ORDER — INSULIN ASPART 100 UNIT/ML FLEXPEN: PEN_INJECTOR | SUBCUTANEOUS | 3 refills | Status: DC

## 2017-06-07 NOTE — Progress Notes (Signed)
Patient ID: [name], female   DOB: [birth year], 67 y.o.   MRN: [id number]  HPI: [name] is a 26 y.o.-year-old female, initially referred by her PCP, [name], for management of DM2, dx 1995, insulin-dependent since 1997, uncontrolled, with complications (gastroparesis, cerebrovasc. Ds-  H/o stroke, PN),  Hypothyroidism, thyroid nodules.  Last visit 4 months ago.  She fell on 05/31/2017.  Reviewing her chart, she had many ED visits since last visit.  Since last visit, she had lows: 40s >> we decreased the insulin doses in 04/2017.  Since then, she only had one lower blood sugar, at 46, and she cannot explain why it dropped so low.  This happened before dinner.  DM2: Last hemoglobin A1c was: Lab Results  Component Value Date   HGBA1C 6.0 (H) 02/05/2017   HGBA1C 6.3 (H) 10/15/2016   HGBA1C 6.6 07/07/2016  10/28/2013: HbA1c 8.5%  She is now on: - Toujeo 25 >> 30 >> 20 units at bedtime - Novolog:  17 >> 10 units with a smaller meal  24 >> 14 units with a larger meal  Could not tolerate Metformin >> nausea.   Pt checks her sugars 4 times a day per review of her log: - am: 86-158 >> 77, 86-169, 318 >> 61-134, 188 - 2h after b'fast: n/c >> 110 >> n/c - before lunch: 90-206, 253 (Prednisone) >> 81-160, 254, 481 >> 60-137, 210 - 2h after lunch: n/c >> 127-154, 229 >> n/c - before dinner: 86-160, 190 but 200s on Prednisone >> 98-245 >> 46 x1, 60-159, 191, 200 - 2h after dinner: n/c - bedtime: 86-180, 250s on Prednisone >> 68, 89-190, 217, 252 >> 65, 71, 101-190, 244 - nighttime: n/c Lowest sugar was 77 >> 46 x1; she has hypoglycemia awareness in the 70s.  Highest sugar was 482 ([place]) >> 244,.  Pt's meals are: - Breakfast: eggs, cereals, fruit, oatmeal, bacon - Lunch: sandwich, beef, mashed potatoes,diet soda - Dinner: chicken, beef, fish, potatoes, vegetables - Snacks: 1-2: fruit  - + Mild CKD, last BUN/creatinine:  Lab Results  Component Value Date   BUN 27 (H)  05/19/2017   CREATININE 1.11 (H) 05/19/2017  On losartan. -+ HL; last set of lipids: Lab Results  Component Value Date   CHOL 175 02/05/2017   HDL 67 02/05/2017   LDLCALC 87 02/05/2017   TRIG 105 02/05/2017   CHOLHDL 3.1 07/19/2015  On Crestor and ezetimibe - last eye exam was in 09/2016: No DR: No DR. Had cataract sx B. Dr. Hassell [facility]). -+ Numbness and tingling in her left leg (affected by stroke) but also in R.  Sees podiatry.  Hypothyroidism: - dx many years ago - On Levoxyl 50 mcg 6/7 days and 251/7 days: - in am - fasting - at least 30 min from b'fast - no Ca, Fe, PPIs - not on Biotin - + Multivitamins at noon and Aciphex at night (but she is not sure whether her nurse moved this to morning)  Latest TSH levels  normal: Lab Results  Component Value Date   TSH 0.827 10/15/2016   TSH 0.906 01/22/2016   TSH 1.030 11/03/2015   TSH 1.207 02/21/2015   TSH 1.352 11/09/2014   She has a h/o thyroid nodules.  She has dysphagia with certain foods. she had previous esophageal dilations.  Pt denies: - feeling nodules in neck - hoarseness - choking - SOB with lying down  Reviewed her imaging and biopsy tests: thyroid U/S (10/2013):  Right thyroid lobe:  3.7 x 1.0 x 1.8 cm   Left thyroid lobe: 3.5 x 1.5 x 1.6 cm   Isthmus: 0.5 cm   Focal nodules: There is a 0.3 mm calcified nodule in the right midzone. There is a 0.6 x 0.7 x 0.5 cm hypoechoic nodule in the  lateral aspect of the right midzone. There is a 0.7 x 0.6 x 0.5 cm hyperechoic nodule in the lateral aspect of the right lower pole.  There is a 2.2 x 1.1 x 1.7 cm complex solid nodule in the inferior aspect of the isthmus extending adjacent to the left lower pole.  There is a 1.4 x 2.1 x 1.4 cm complex solid nodule in the left lower pole.   Lymphadenopathy: None visualized.  Repeat U/S (01/2014): stable appearance of nodules  FNA (2014) x2: benign  Repeat U/S (10/2015): stable appearance of nodules  She had a  L rib fracture in 12/2014.  Patient was admitted with AMS + sepsis on 10/15/2016. She had aspiration pneumonia and acute respiratory failure. She was on Prednisone after d/c for 3 weeks.  ROS: Constitutional: no weight gain/no weight loss, + fatigue, no subjective hyperthermia, + subjective hypothermia, + nocturia Eyes: no blurry vision, no xerophthalmia ENT: no sore throat, + see HPI Cardiovascular: no CP/+ SOB/no palpitations/no leg swelling Respiratory: no cough/+ SOB/+ wheezing Gastrointestinal: no N/no V/no D/no C/no acid reflux Musculoskeletal: no muscle aches/+ joint aches Skin: no rashes, no hair loss Neurological: no tremors/no numbness/no tingling/no dizziness  I reviewed pt's medications, allergies, PMH, social hx, family hx, and changes were documented in the history of present illness. Otherwise, unchanged from my initial visit note.  Past Medical History:  Diagnosis Date  . Anemia   . Anxiety    takes Ativan daily  . Arthritis   . Bipolar disorder ([place])    takes Risperdal nightly  . Blood transfusion   . Cancer ([place])    In her gum  . Carpal tunnel syndrome of right wrist 05/23/2011  . Cervical disc disorder with radiculopathy of cervical region 10/31/2012  . Chronic back pain   . Chronic idiopathic constipation   . Colon polyps   . COPD (chronic obstructive pulmonary disease) with chronic bronchitis ([place]) 09/16/2013   Office Spirometry 10/30/2013-submaximal effort based on appearance of loop and curve. Numbers would fit with severe restriction but her physiologic capability may be better than this. FVC 0.91/44%, and 10.74/45%, FEV1/FVC 0.81, FEF 25-75% 1.43/69%    . Depression    takes Zoloft daily  . Diabetes mellitus    Type II  . Diverticulosis    TCS 9/08 by Dr. Delfin Edis for diarrhea . Bx for micro scopic colitis negative.   . Fibromyalgia   . Frequent falls   . GERD (gastroesophageal reflux disease)    takes Aciphex daily  . Glaucoma    eye drops daily   . Gum symptoms    infection on antibiotic  . Hemiplegia affecting non-dominant side, post-stroke 08/02/2011  . Hyperlipidemia    takes Crestor daily  . Hypertension    takes Amlodipine,Metoprolol,and Clonidine daily  . Hypothyroidism    takes Synthroid daily  . IBS (irritable bowel syndrome)   . Insomnia    takes Trazodone nightly  . 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 Metabolic encephalopathy 4/48/1856  . Migraines    chronic headaches  . Mononeuritis lower limb   . Narcolepsy   . Osteoporosis   . Pancreatitis 2006   due to Depakote with normal EUS   .  Schatzki's ring    non critical / EGD with ED 8/2011with RMR  . Seizures ([place])    takes Lamictal daily.Last seizure 3 yrs ago  . Sleep apnea    on CPAP  . Stroke [facility])    left sided weakness  . Tubular adenoma of colon    Past Surgical History:  Procedure Laterality Date  . ABDOMINAL HYSTERECTOMY  1978  . BACK SURGERY  July 2012  . BACTERIAL OVERGROWTH TEST N/A 05/05/2013   Procedure: BACTERIAL OVERGROWTH TEST;  Surgeon: Daneil Dolin, MD;  [facility];  Service: Endoscopy;  Laterality: N/A;  7:30  . BIOPSY THYROID  2009  . BRAIN SURGERY  11/2011   resection of meningioma  . BREAST REDUCTION SURGERY  1994  . CARDIAC CATHETERIZATION  05/10/2005   normal coronaries, normal LV systolic function and EF (Dr. Jackie Plum)  . CARPAL TUNNEL RELEASE Left 07/22/04   Dr. Aline Brochure  . CATARACT EXTRACTION Bilateral   . CHOLECYSTECTOMY  1984  . COLONOSCOPY N/A 09/25/2012   QAS:TMHDQQI diverticulosis.  colonic polyp-removed : tubular adenoma  . CRANIOTOMY  11/23/2011   Procedure: CRANIOTOMY TUMOR EXCISION;  Surgeon: Hosie Spangle, MD;  Location: [place] NEURO ORS;  Service: Neurosurgery;  Laterality: N/A;  Craniotomy for tumor resection  . ESOPHAGOGASTRODUODENOSCOPY  12/29/2010   Rourk-Retained food in the esophagus and stomach, small hiatal hernia, status post Maloney dilation of the esophagus  . ESOPHAGOGASTRODUODENOSCOPY  N/A 09/25/2012   WLN:[id number] atonic baggy esophagus status post Maloney dilation 31 F. Hiatal hernia  . GIVENS CAPSULE STUDY N/A 01/15/2013   NORMAL.   . IR GENERIC HISTORICAL  03/17/2016   IR RADIOLOGIST EVAL & MGMT 03/17/2016 [facility]-INTERV RAD  . LESION REMOVAL N/A 05/31/2015   Procedure: REMOVAL RIGHT AND LEFT LESIONS OF MANDIBLE;  Surgeon: Diona Browner, DDS;  Location: [place];  Service: Oral Surgery;  Laterality: N/A;  . MALONEY DILATION  12/29/2010   RMR;  . NM MYOCAR PERF WALL MOTION  2006   "relavtiely normal" persantine, mild anterior thinning (breast attenuation artifact), no region of scar/ischemia  . OVARIAN CYST REMOVAL    . RECTOCELE REPAIR N/A 06/29/2015   Procedure: POSTERIOR REPAIR (RECTOCELE);  Surgeon: Jonnie Kind, MD;  Location: [facility];  Service: Gynecology;  Laterality: N/A;  . REDUCTION MAMMAPLASTY Bilateral   . SPINE SURGERY  09/29/2010   Dr. Rolena Infante  . surgical excision of 3 tumors from right thigh and right buttock  and left upper thigh  2010  . TOOTH EXTRACTION Bilateral 12/14/2014   Procedure: REMOVAL OF BILATERAL MANDIBULAR EXOSTOSES;  Surgeon: Diona Browner, DDS;  Location: [facility];  Service: Oral Surgery;  Laterality: Bilateral;  . TRANSTHORACIC ECHOCARDIOGRAM  2010   EF 60-65%, mild conc LVH, grade 1 diastolic dysfunction; mildly calcified MV annulus with mildly thickened leaflets, mildly calcified MR annulus   History   Social History  . Marital Status: Divorced    Spouse Name: N/A    Number of Children: 1  . Years of Education: 12   Occupational History  . disabled     Social History Main Topics  . Smoking status: Current Every Day Smoker -- 0.25 packs/day for 7 years    Types: Cigarettes  . Smokeless tobacco: Never Used     Comment: "started back but off now for 3 months" (08/18/13)  . Alcohol Use: No     Comment:    . Drug Use: No   Current Outpatient Medications on File Prior to Visit  Medication Sig Dispense Refill  . ACCU-CHEK SMARTVIEW test strip  TEST BLOOD SUGAR FOUR TIMES DAILY AS DIRECTED 350 each 2  . albuterol (PROVENTIL HFA;VENTOLIN HFA) 108 (90 Base) MCG/ACT inhaler Inhale 1-2 puffs every 6 hours as needed for wheezing, shortness of breath 3 Inhaler 3  . ascorbic acid (VITAMIN C) 500 MG tablet Take 500 mg by mouth daily.    [name] aspirin EC 81 MG tablet Take 81 mg by mouth daily.    . Cholecalciferol (VITAMIN D3) 5000 units CAPS Take 1 capsule by mouth daily.    . cloNIDine (CATAPRES) 0.3 MG tablet TAKE 1 TABLET BY MOUTH EVERY EIGHT HOURS. ONCE AT 8AM, 4PM, AND 12 MIDNIGHT. 270 tablet 1  . HYDROcodone-acetaminophen (NORCO/VICODIN) 5-325 MG tablet Take 1 pill every 4-6 hours for pain not relieved by your normal pain medicine 15 tablet 0  . hydroxychloroquine (PLAQUENIL) 200 MG tablet Take 200 mg by mouth daily.     . insulin aspart (NOVOLOG FLEXPEN) 100 UNIT/ML FlexPen INJECT 17 TO 24 UNITS INTO THE SKIN 3 (THREE) TIMES DAILY WITH MEALS. 45 mL 3  . Insulin Glargine (TOUJEO SOLOSTAR) 300 UNIT/ML SOPN Inject 30 Units into the skin at bedtime. (Patient taking differently: Inject 34 Units into the skin at bedtime. ) 6 pen 3  . lamoTRIgine (LAMICTAL) 100 MG tablet TAKE 1 TABLET BY MOUTH TWICE DAILY. 60 tablet 0  . levothyroxine (SYNTHROID, LEVOTHROID) 50 MCG tablet One tab once daily and one half tab on sunday (Patient taking differently: Take 50 mcg by mouth daily before breakfast. One tab once daily and one half tab on sunday) 90 tablet 1  . LORazepam (ATIVAN) 0.5 MG tablet Take 1 tablet (0.5 mg total) by mouth 3 (three) times daily. 270 tablet 2  . losartan (COZAAR) 50 MG tablet Take 1 tablet (50 mg total) by mouth daily. 90 tablet 1  . metoprolol tartrate (LOPRESSOR) 50 MG tablet Take 1 tablet (50 mg total) by mouth 2 (two) times daily. Need office visit 15 tablet 0  . montelukast (SINGULAIR) 10 MG tablet Take 1 tablet (10 mg total) by mouth daily. 90 tablet 1  . Multiple Vitamins-Minerals (HM MULTIVITAMIN ADULT GUMMY PO) Take 1 tablet by  mouth daily.    . pregabalin (LYRICA) 75 MG capsule Take 1 capsule (75 mg total) by mouth daily.    . RABEprazole (ACIPHEX) 20 MG tablet TAKE 1 TABLET BY MOUTH TWICE DAILY. 180 tablet 0  . RESTASIS 0.05 % ophthalmic emulsion Place 1 drop into both eyes 2 (two) times daily.     . risperiDONE (RISPERDAL) 0.5 MG tablet Take 1 tablet (0.5 mg total) by mouth at bedtime. 90 tablet 2  . rosuvastatin (CRESTOR) 5 MG tablet Take 1 tablet (5 mg total) by mouth at bedtime. 90 tablet 1  . sertraline (ZOLOFT) 100 MG tablet Take 1 tablet (100 mg total) by mouth daily. 90 tablet 2  . traMADol (ULTRAM) 50 MG tablet Take 1 tablet (50 mg total) by mouth every 6 (six) hours as needed. (Patient taking differently: Take 50 mg by mouth 3 (three) times daily as needed for moderate pain. ) 15 tablet 0  . traZODone (DESYREL) 100 MG tablet Take 1 tablet (100 mg total) by mouth at bedtime. (Patient taking differently: Take 200 mg by mouth at bedtime. ) 90 tablet 2   No current facility-administered medications on file prior to visit.       Allergies  Allergen Reactions  . Cephalexin Hives  . Iron Nausea  And Vomiting  . Milk-Related Compounds Other (See Comments)    Doesn't agree with stomach.   . Penicillins Hives       . Phenazopyridine Hcl Hives         Family History  Problem Relation Age of Onset  . Heart attack Mother        HTN  . Pneumonia Father   . Kidney failure Father   . Diabetes Father   . Pancreatic cancer Sister   . Diabetes Brother   . Hypertension Brother   . Diabetes Brother   . Cancer Sister        breast   . Hypertension Son   . Sleep apnea Son   . Cancer Sister        pancreatic  . Stroke Maternal Grandmother   . Heart attack Maternal Grandfather   . Alcohol abuse Maternal Uncle   . Colon cancer Neg Hx   . Anesthesia problems Neg Hx   . Hypotension Neg Hx   . Malignant hyperthermia Neg Hx   . Pseudochol deficiency Neg Hx    PE: BP (!) 174/68   Pulse 74   Ht 5\' 1"   (1.549 m)   Wt 152 lb 3.2 oz (69 kg)   SpO2 98%   BMI 28.76 kg/m  Body mass index is 28.76 kg/m.  Wt Readings from Last 3 Encounters:  06/07/17 152 lb 3.2 oz (69 kg)  05/31/17 157 lb (71.2 kg)  05/24/17 157 lb (71.2 kg)   Constitutional: Normal weight, in NAD Eyes: PERRLA, EOMI, no exophthalmos ENT: moist mucous membranes, no thyromegaly, + enlarged nodule felt in isthmus, no cervical lymphadenopathy Cardiovascular: RRR, No RG, + 1/6 SEM Respiratory: CTA B Gastrointestinal: abdomen soft, NT, ND, BS+ Musculoskeletal: no deformities, strength intact in all 4 Skin: moist, warm, no rashes Neurological: no tremor with outstretched hands, DTR normal in all 4   ASSESSMENT: 1. DM2, insulin-dependent, uncontrolled, without complications - gastroparesis - cerebrovasc. Ds -  H/o stroke - PN  She was interested in an insulin pump >> discussed about [employer] (given brochure) at a previous visit.  2. Hypothyroidism  3. MNG - Thyroid U/S (10/11/2012):  Right thyroid lobe: 3.7 x 1.0 x 1.8 cm  Left thyroid lobe: 3.5 x 1.5 x 1.6 cm  Isthmus: 0.5 cm  Focal nodules: There is a 0.3 mm calcified nodule in the right midzone. There is a 0.6 x 0.7 x 0.5 cm hypoechoic nodule in the lateral aspect of the right midzone. There is a 0.7 x 0.6 x 0.5 cm hyperechoic nodule in the lateral aspect of the right lower pole. There is a 2.2 x 1.1 x 1.7 cm complex solid nodule in the inferior aspect of the isthmus extending adjacent to the left lower pole. There is a 1.4 x 2.1 x 1.4 cm complex solid nodule in the left lower pole.  Lymphadenopathy: None visualized.  IMPRESSION: Multi nodular goiter which has markedly progressed since the prior exam. The dominant nodule at the inferior aspect of the isthmus fits criteria for fine needle aspiration biopsy if not previously Assessed.  - FNA (11/05/2012) x2: benign  - Thyroid U/S (01/20/2014) - felt one nodule enlarging >> new U/S: stable appearance  of the nodules, except a small new nodule in isthmus: 1.2 cm   Right thyroid lobe Measurements: 4.4 cm x 1.2 cm x 1.7 cm. Multiple right-sided nodules identified. Each right-sided nodule demonstrates increased echogenicity, with the superior measuring 7 mm -8 mm, most inferior measuring 9  mm - 10 mm. A small, 3 mm focus of calcium with posterior shadowing is evident. There is also a mid nodule measuring 7 mm, which is echogenic.  Left thyroid lobe Measurements: 5.1 cm x 2.1 cm x 2.3 cm. Dominant lesion at the inferior pole of left thyroid is again evident, which has been previously biopsied (10/29/2012). This nodule measures 1.8 cm x 1.7cm x 2.5 cm. (Previous 1.4 cm x 2.1 cm x 1.4 cm)  Isthmus Thickness: 4 mm-5 mm. Isthmic nodule again noted, previously biopsied (10/29/2012). Currently this nodule measures 2.2 cm x 1.4 cm x 1.8 cm. (previous measurement 2.2 cm x 1.1 cm x 1.7 cm). There is a new nodule identified within the isthmus with heterogeneously hyperechoic characteristics. This nodule measures 12 mm x 5.4 mm x 7.2 mm.  Lymphadenopathy: None visualized.  - Thyroid U/S (11/09/2015): Details   Reading Physician Reading Date Result Priority  Sandi Mariscal, MD 11/09/2015   Narrative    CLINICAL DATA: Follow-up thyroid nodules.  EXAM: THYROID ULTRASOUND  TECHNIQUE: Ultrasound examination of the thyroid gland and adjacent soft tissues was performed.  COMPARISON: Thyroid ultrasound - 01/20/2014 ; 10/11/2012  FINDINGS: Parenchymal Echotexture: Moderately heterogenous  Estimated total number of nodules > 1 cm: <5  Number of spongiform nodules > 2 cm not described below (TR1): 0  Number of mixed cystic nodules > 1.5 cm not described below ([place]): 0  _________________________________________________________  Isthmus: 0.7 cm  Nodule # 1:  Prior biopsy: No  Location: Isthmus; left  Size: 1.5 x 1.3 x 1.5 cm, previously 2.2 x 1.4 x 1.8 cm  Composition: solid/almost completely  solid (2)  Echogenicity: hyperechoic (1)  Shape: not taller-than-wide (0)  Margins: ill-defined (0)  Echogenic foci: none (0)  ACR TI-RADS total points: 3.  ACR TI-RADS risk category: TR3 (3 points).  Change in features: Yes. Increased internal cystic degeneration and smaller in size.  Change in ACR TI-RADS risk category: No  ACR TI-RADS recommendations:  *Given size (1.5 - 2.4 cm) and appearance, a follow-up ultrasound in 1 year is recommended based on TI-RADS criteria as clinically indicated.  _________________________________________________________  Right lobe: 4.6 x 1.1 x 1.9 cm, previously 4.4 x 1.2 x 1.7 cm  Stable scattered subcentimeter echogenic nodules  _________________________________________________________  Left lobe: 4.8 x 2.1 x 1.9 cm, previously 5.1 x 2.1 x 2.3 cm  Nodule # 1:  Prior biopsy: No  Location: Left; Inferior  Size: 2.4 x 1.7 x 1.9 cm, previously 2.5 x 1.7 x 1.7  Composition: solid/almost completely solid (2)  Echogenicity: hyperechoic (1)  Shape: not taller-than-wide (0)  Margins: ill-defined (0)  Echogenic foci: none (0)  ACR TI-RADS total points: 3.  ACR TI-RADS risk category: TR3 (3 points).  Change in features: No  Change in ACR TI-RADS risk category: No  ACR TI-RADS recommendations:  *Given size (1.5 - 2.4 cm) and appearance, a follow-up ultrasound in 1 year is recommended based on TI-RADS criteria as clinically indicated.  IMPRESSION: TR 3 isthmic and left thyroid nodules unchanged. *Given size (1.5 - 2.4 cm) and appearance of both thyroid nodules, a follow-up ultrasound in 1 year is recommended based on TI-RADS criteria as clinically indicated.  The above is in keeping with the ACR TI-RADS recommendations - J Am Coll Radiol 2017;14:587-595.   Electronically Signed By: Sandi Mariscal M.D. On: 11/09/2015 19:10       None of the nodules have enlarged or show concerning features. Since they have been  biopsied before, will continue following them  clinically and by ultrasound.  PLAN:  1. Patient with long-standing, uncontrolled, diabetes, on basal-bolus insulin regimen, with good control, and last HbA1c at goal, at 6.0%.  We did not change her regimen at last visit. -At this visit, sugars are at goal mostly with few higher and pure lower than goal. - No need to change the regimen at this time but I strongly advised her to let me know if she starts having lows again. - I advised her to:  Patient Instructions  Please continue: - Toujeo 20 units at bedtime - Novolog:  10 units with a smaller meal  14 units with a larger meal or with lunch  Please make sure you take Aciphex >4h after Levothyroxine.  Please return in 4 months with your sugar log.   - today, HbA1c is 5.8% (excellent). - continue checking sugars at different times of the day - check 3x a day, rotating checks - advised for yearly eye exams >> she is UTD - Return to clinic in 4 mo with sugar log   2. Hypothyroidism - latest thyroid labs reviewed with pt >> normal in 10/2016 - she continues on LT4 50 mcg 6 out of 7 days and 25 mcg 1 out of 7 days - pt feels good on this dose. - we discussed about taking the thyroid hormone every day, with water, >30 minutes before breakfast, separated by >4 hours from acid reflux medications, calcium, iron, multivitamins. Pt. is taking it correctly, but it is unclear whether she is now taking AcipHex in the morning.  I advised her to go home and make sure that this is moved at least 4 hours after levothyroxine -We will check her TFTs at next visit.  3. MNG -She previously had 2 normal biopsies in 2014 -Reviewed the report of her imaging tests: Ultrasounds from 2014, 15, 17: Stable nodules -She has history of mild dysphagia, but no external esophageal compression per barium swallow from 08/2014.  At that time, she did have some esophageal dysmotility and she had multiple esophageal  dilations. - No other neck compression symptoms -We will continue to watch her goiter expectantly.  Philemon Kingdom, MD PhD [facility] Endocrinology

## 2017-06-07 NOTE — Patient Instructions (Addendum)
Please continue: - Toujeo 20 units at bedtime - Novolog:  10 units with a smaller meal  14 units with a larger meal or with lunch  Please make sure you take Aciphex >4h after Levothyroxine.  Please return in 4 months with your sugar log.

## 2017-06-08 DIAGNOSIS — E1142 Type 2 diabetes mellitus with diabetic polyneuropathy: Secondary | ICD-10-CM | POA: Diagnosis not present

## 2017-06-08 DIAGNOSIS — R296 Repeated falls: Secondary | ICD-10-CM | POA: Diagnosis not present

## 2017-06-08 DIAGNOSIS — I110 Hypertensive heart disease with heart failure: Secondary | ICD-10-CM | POA: Diagnosis not present

## 2017-06-08 DIAGNOSIS — I69354 Hemiplegia and hemiparesis following cerebral infarction affecting left non-dominant side: Secondary | ICD-10-CM | POA: Diagnosis not present

## 2017-06-08 DIAGNOSIS — Z87891 Personal history of nicotine dependence: Secondary | ICD-10-CM | POA: Diagnosis not present

## 2017-06-08 DIAGNOSIS — I509 Heart failure, unspecified: Secondary | ICD-10-CM | POA: Diagnosis not present

## 2017-06-08 DIAGNOSIS — F250 Schizoaffective disorder, bipolar type: Secondary | ICD-10-CM | POA: Diagnosis not present

## 2017-06-08 DIAGNOSIS — J449 Chronic obstructive pulmonary disease, unspecified: Secondary | ICD-10-CM | POA: Diagnosis not present

## 2017-06-08 DIAGNOSIS — Z794 Long term (current) use of insulin: Secondary | ICD-10-CM | POA: Diagnosis not present

## 2017-06-09 ENCOUNTER — Telehealth: Payer: Self-pay | Admitting: Family Medicine

## 2017-06-09 MED ORDER — CETIRIZINE HCL 10 MG PO TABS: 10.0000 mg | ORAL_TABLET | Freq: Every day | ORAL | 1 refills | Status: DC

## 2017-06-09 MED ORDER — PREDNISONE 5 MG PO TABS: ORAL_TABLET | ORAL | 0 refills | Status: DC

## 2017-06-09 NOTE — Telephone Encounter (Signed)
Pt called in c/o bruising on side where she recently fell was seen in ED on 05/31/2017, scan shows possible hematoma, can't fill short course of hydrocodone  Prescribed in ED and is afraid, I advised ES tylenol 2 tablets daily C/o runny nose , sneezing and coughing, taking singulair daily , cannot use nose sprays because of nose bleeds, will also add zyrtec and prednisone for 5 days to local pharmacy , she is aware

## 2017-06-09 NOTE — Progress Notes (Signed)
  Subjective:  Patient ID: [name], female    DOB: 1950/05/22,  MRN: [id number]  No chief complaint on file.  67 y.o. female returns for diabetic foot care.  Reports diabetes with numbness and tingling to both feet.  Denies other pedal issues.  Did not check sugars this a.m.  Objective:   General AA&O x3. Normal mood and affect.  Vascular Dorsalis pedis pulses present 1+ bilaterally  Posterior tibial pulses 1+ bilaterally  Capillary refill normal to all digits. Pedal hair growth normal.  Neurologic Epicritic sensation present bilaterally. Protective sensation with 5.07 monofilament  absentbilaterally. Vibratory sensation present bilaterally.  Dermatologic No open lesions. Interspaces clear of maceration.  Normal skin temperature and turgor. Hyperkeratotic lesions: None bilaterally. Nails: brittle, onychomycosis, thickening, elongation  Orthopedic: No history of amputation. MMT 5/5 in dorsiflexion, plantarflexion, inversion, and eversion. Normal lower extremity joint ROM without pain or crepitus.     Assessment & Plan:  Patient was evaluated and treated and all questions answered.  Diabetes with DPN, Onychomycosis -Educated on diabetic footcare. Diabetic risk level 1 -At risk foot care provided as below.  Procedure: Nail Debridement Rationale: Patient meets criteria for routine foot care due to 1 Class B, 2 class C findings. Type of Debridement: manual, sharp debridement. Instrumentation: Nail nipper, rotary burr. Number of Nails: 10  Return in about 3 months (around 08/25/2017) for Diabetic Foot Care.

## 2017-06-10 ENCOUNTER — Encounter (HOSPITAL_COMMUNITY): Payer: Self-pay | Admitting: Emergency Medicine

## 2017-06-10 ENCOUNTER — Encounter: Payer: Self-pay | Admitting: Family Medicine

## 2017-06-10 ENCOUNTER — Other Ambulatory Visit: Payer: Self-pay

## 2017-06-10 ENCOUNTER — Emergency Department (HOSPITAL_COMMUNITY)
Admission: EM | Admit: 2017-06-10 | Discharge: 2017-06-10 | Disposition: A | Discharge status: Home / Self Care | Payer: Medicare HMO | Attending: Emergency Medicine | Admitting: Emergency Medicine

## 2017-06-10 DIAGNOSIS — S301XXA Contusion of abdominal wall, initial encounter: Secondary | ICD-10-CM | POA: Diagnosis not present

## 2017-06-10 DIAGNOSIS — R0989 Other specified symptoms and signs involving the circulatory and respiratory systems: Secondary | ICD-10-CM | POA: Insufficient documentation

## 2017-06-10 DIAGNOSIS — I10 Essential (primary) hypertension: Secondary | ICD-10-CM | POA: Diagnosis not present

## 2017-06-10 DIAGNOSIS — Z7982 Long term (current) use of aspirin: Secondary | ICD-10-CM | POA: Insufficient documentation

## 2017-06-10 DIAGNOSIS — Z79899 Other long term (current) drug therapy: Secondary | ICD-10-CM | POA: Diagnosis not present

## 2017-06-10 DIAGNOSIS — M7981 Nontraumatic hematoma of soft tissue: Secondary | ICD-10-CM | POA: Insufficient documentation

## 2017-06-10 DIAGNOSIS — Z87891 Personal history of nicotine dependence: Secondary | ICD-10-CM | POA: Insufficient documentation

## 2017-06-10 DIAGNOSIS — E039 Hypothyroidism, unspecified: Secondary | ICD-10-CM | POA: Insufficient documentation

## 2017-06-10 DIAGNOSIS — T148XXA Other injury of unspecified body region, initial encounter: Secondary | ICD-10-CM

## 2017-06-10 DIAGNOSIS — E1142 Type 2 diabetes mellitus with diabetic polyneuropathy: Secondary | ICD-10-CM | POA: Diagnosis not present

## 2017-06-10 DIAGNOSIS — M549 Dorsalgia, unspecified: Secondary | ICD-10-CM | POA: Diagnosis not present

## 2017-06-10 DIAGNOSIS — Z794 Long term (current) use of insulin: Secondary | ICD-10-CM | POA: Insufficient documentation

## 2017-06-10 DIAGNOSIS — J449 Chronic obstructive pulmonary disease, unspecified: Secondary | ICD-10-CM | POA: Insufficient documentation

## 2017-06-10 DIAGNOSIS — Z9189 Other specified personal risk factors, not elsewhere classified: Secondary | ICD-10-CM | POA: Insufficient documentation

## 2017-06-10 LAB — URINALYSIS, ROUTINE W REFLEX MICROSCOPIC
BILIRUBIN URINE: NEGATIVE
Glucose, UA: NEGATIVE mg/dL
HGB URINE DIPSTICK: NEGATIVE
KETONES UR: NEGATIVE mg/dL
Leukocytes, UA: NEGATIVE
NITRITE: NEGATIVE
PROTEIN: NEGATIVE mg/dL
Specific Gravity, Urine: 1.011 (ref 1.005–1.030)
pH: 6 (ref 5.0–8.0)

## 2017-06-10 MED ORDER — HYDROCODONE-ACETAMINOPHEN 5-325 MG PO TABS
2.0000 | ORAL_TABLET | ORAL | Status: AC
  Administered 2017-06-10: 2 via ORAL
  Filled 2017-06-10: qty 2

## 2017-06-10 NOTE — Assessment & Plan Note (Signed)
chronic anemia,undetermined etiology , will refer to hematology for assesment

## 2017-06-10 NOTE — Progress Notes (Signed)
[name]     MRN: [id number]      DOB: [birth year]   HPI [name] is here for follow up and re-evaluation of chronic medical conditions, medication management and review of any available recent lab and radiology data.  C/o left neck and arm pain  For which she is receiving therapy with some benefit C/o reduced mobility of right index with nodule C/o right arm  Rash has upcoming derm evaluation by Dr Jerrell Belfast   ROS Denies recent fever or chills. Denies sinus pressure, nasal congestion, ear pain or sore throat. Denies chest congestion, productive cough or wheezing. Denies , palpitations and leg swelling Denies abdominal pain, nausea, vomiting,diarrhea or constipation.   Denies dysuria, frequency, hesitancy c/o incontinence. C/o chronic  joint pain, swelling and limitation in mobility. Denies headaches, seizures, numbness, or tingling. Denies uncontrolled  depression, anxiety or insomnia. [name]   PE  BP 118/64   Pulse 64   Resp 16   Ht 5\' 1"  (1.549 m)   Wt 157 lb (71.2 kg)   SpO2 96%   BMI 29.66 kg/m   Patient alert and oriented and in no cardiopulmonary distress.  HEENT: No facial asymmetry, EOMI,   oropharynx pink and moist.  Neck decreased ROM with left neck spasm, no JVD, no mass.  Chest: Clear to auscultation bilaterally.  CVS: S1, S2 no murmurs, no S3.Regular rate.  ABD: Soft non tender.   Ext: No edema  MS: decreased  ROM spine, shoulders, hips and knees.  Skin: Intact, erythematous macular  rash noted.  Psych: Good eye contact, normal affect. Memory intact not anxious or depressed appearing.  CNS: CN 2-12 intact, power,  normal throughout.no focal deficits noted.   Assessment & Plan  Obstructive sleep apnea Pt noted  On numerous occasions to have excessive drowsiness/ sleepiness, which is deemed multifactorial, medication may play a role, but she also has been  formally diagnosed with mild  sleep apnea and needs to be re evaluated byr her treating MD,  will refer  Malignant hyperpyrexia Pt on multiple antihypertensive meds and bBP often elevated, probably regime is to complex to be followed , will refer her to cardiology for BP eval and also for CV risk assessment as she is at high risk based on co morbidities  Labile hypertension Fluctuating blood pressures are documented , which may reflect non compliance with a challenging regime of tid clonidine or true lability, will refer to cardiology for eval including echo which she has not had  At risk for cardiovascular event Long h/o nicotine use, IDDM, HTN, and hyperlipidemia, needs evaluation for CAD, will refer  Finger pain, right Exam and history c/w arthritic changes in the joint  Type 2 diabetes mellitus with hyperglycemia, with long-term current use of insulin (HCC) Controlled, no change in medication Managed by endocrine [name] is reminded of the importance of commitment to daily physical activity for 30 minutes or more, as able and the need to limit carbohydrate intake to 30 to 60 grams per meal to help with blood sugar control.   The need to take medication as prescribed, test blood sugar as directed, and to call between visits if there is a concern that blood sugar is uncontrolled is also discussed.   [name] is reminded of the importance of daily foot exam, annual eye examination, and good blood sugar, blood pressure and cholesterol control.  Diabetic Labs Latest Ref Rng & Units 06/07/2017 05/19/2017 04/10/2017 03/31/2017 03/26/2017  HbA1c - 5.8 - - - -  Microalbumin <2.0 mg/dL - - - - -  Micro/Creat Ratio 0.0 - 30.0 mg/g - - - - -  Chol 100 - 199 mg/dL - - - - -  HDL >39 mg/dL - - - - -  Calc LDL 0 - 99 mg/dL - - - - -  Triglycerides 0 - 149 mg/dL - - - - -  Creatinine 0.44 - 1.00 mg/dL - 1.11(H) 0.81 1.40(H) 0.99  GFR >60.00 mL/min - - - - -   BP/Weight 06/07/2017 05/31/2017 05/24/2017 05/19/2017 04/30/2017 04/10/2017 04/30/2881  Systolic BP 374 451 460 479 987 215 872  Diastolic  BP 68 61 64 72 62 58 80  Wt. (Lbs) 152.2 157 157 156 156 155 155.25  BMI 28.76 29.66 29.66 29.48 29.48 29.29 29.33  Some encounter information is confidential and restricted. Go to Review Flowsheets activity to see all data.   Foot/eye exam completion dates Latest Ref Rng & Units 03/28/2017 09/25/2016  Eye Exam No Retinopathy Retinopathy(A) -  Foot exam Order - - -  Foot Form Completion - - Done         Rash and nonspecific skin eruption Upcoming appt with dermatology for right forearm rash  Anemia chronic anemia,undetermined etiology , will refer to hematology for assesment

## 2017-06-10 NOTE — Assessment & Plan Note (Signed)
Controlled, no change in medication Managed by endocrine [name] is reminded of the importance of commitment to daily physical activity for 30 minutes or more, as able and the need to limit carbohydrate intake to 30 to 60 grams per meal to help with blood sugar control.   The need to take medication as prescribed, test blood sugar as directed, and to call between visits if there is a concern that blood sugar is uncontrolled is also discussed.   [name] is reminded of the importance of daily foot exam, annual eye examination, and good blood sugar, blood pressure and cholesterol control.  Diabetic Labs Latest Ref Rng & Units 06/07/2017 05/19/2017 04/10/2017 03/31/2017 03/26/2017  HbA1c - 5.8 - - - -  Microalbumin <2.0 mg/dL - - - - -  Micro/Creat Ratio 0.0 - 30.0 mg/g - - - - -  Chol 100 - 199 mg/dL - - - - -  HDL >39 mg/dL - - - - -  Calc LDL 0 - 99 mg/dL - - - - -  Triglycerides 0 - 149 mg/dL - - - - -  Creatinine 0.44 - 1.00 mg/dL - 1.11(H) 0.81 1.40(H) 0.99  GFR >60.00 mL/min - - - - -   BP/Weight 06/07/2017 05/31/2017 05/24/2017 05/19/2017 04/30/2017 04/10/2017 1/59/4585  Systolic BP 929 244 628 638 177 116 579  Diastolic BP 68 61 64 72 62 58 80  Wt. (Lbs) 152.2 157 157 156 156 155 155.25  BMI 28.76 29.66 29.66 29.48 29.48 29.29 29.33  Some encounter information is confidential and restricted. Go to Review Flowsheets activity to see all data.   Foot/eye exam completion dates Latest Ref Rng & Units 03/28/2017 09/25/2016  Eye Exam No Retinopathy Retinopathy(A) -  Foot exam Order - - -  Foot Form Completion - - Done

## 2017-06-10 NOTE — Assessment & Plan Note (Signed)
Fluctuating blood pressures are documented , which may reflect non compliance with a challenging regime of tid clonidine or true lability, will refer to cardiology for eval including echo which she has not had

## 2017-06-10 NOTE — ED Provider Notes (Signed)
[facility] EMERGENCY DEPARTMENT Provider Note   CSN: [id number] Arrival date & time: 06/10/17  1556     History   Chief Complaint Chief Complaint  Patient presents with  . Back Pain    HPI [name] is a 67 y.o. female.  HPI The patient tripped and fell on March 18.  She landed on her right flank area.  Patient ended up going to the emergency room to be evaluated March 21.  Patient had x-rays of her ribs on the 21st that did not show any acute abnormality.  She also had a CT scan of her abdomen pelvis without contrast.  CT scan demonstrated moderate right flank subcutaneous soft tissue stranding and a small hematoma.  There is no other acute abnormality.  Patient states she continues to hurt.  She also feels like she developed a knot in that right flank area since the injury.  She denies any fevers.  She is having some urinary discomfort.  No vomiting or diarrhea.  No numbness or weakness.  Patient does have some chronic back pain issues.  She is on a pain contract and takes opiates for her pain. Past Medical History:  Diagnosis Date  . Anemia   . Anxiety    takes Ativan daily  . Arthritis   . Bipolar disorder ([place])    takes Risperdal nightly  . Blood transfusion   . Cancer ([place])    In her gum  . Carpal tunnel syndrome of right wrist 05/23/2011  . Cervical disc disorder with radiculopathy of cervical region 10/31/2012  . Chronic back pain   . Chronic idiopathic constipation   . Colon polyps   . COPD (chronic obstructive pulmonary disease) with chronic bronchitis ([place]) 09/16/2013   Office Spirometry 10/30/2013-submaximal effort based on appearance of loop and curve. Numbers would fit with severe restriction but her physiologic capability may be better than this. FVC 0.91/44%, and 10.74/45%, FEV1/FVC 0.81, FEF 25-75% 1.43/69%    . Depression    takes Zoloft daily  . Diabetes mellitus    Type II  . Diverticulosis    TCS 9/08 by Dr. Delfin Edis for diarrhea . Bx for micro  scopic colitis negative.   . Fibromyalgia   . Frequent falls   . GERD (gastroesophageal reflux disease)    takes Aciphex daily  . Glaucoma    eye drops daily  . Gum symptoms    infection on antibiotic  . Hemiplegia affecting non-dominant side, post-stroke 08/02/2011  . Hyperlipidemia    takes Crestor daily  . Hypertension    takes Amlodipine,Metoprolol,and Clonidine daily  . Hypothyroidism    takes Synthroid daily  . IBS (irritable bowel syndrome)   . Insomnia    takes Trazodone nightly  . 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 Metabolic encephalopathy 8/41/6606  . Migraines    chronic headaches  . Mononeuritis lower limb   . Narcolepsy   . Osteoporosis   . Pancreatitis 2006   due to Depakote with normal EUS   . Schatzki's ring    non critical / EGD with ED 8/2011with RMR  . Seizures ([place])    takes Lamictal daily.Last seizure 3 yrs ago  . Sleep apnea    on CPAP  . Stroke [facility])    left sided weakness  . Tubular adenoma of colon     Patient Active Problem List   Diagnosis Date Noted  . At risk for cardiovascular event 06/10/2017  . 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 Rash and nonspecific  skin eruption 04/25/2017  . Finger pain, right 01/28/2017  . Hypotension   . Diabetic polyneuropathy associated with type 2 diabetes mellitus ([place]) 05/19/2016  . Smoker 04/24/2016  . History of palpitations 08/09/2015  . Nausea without vomiting 08/09/2015  . Labile hypertension 08/03/2015  . Normal coronary arteries 08/03/2015  . Left-sided low back pain with left-sided sciatica 06/27/2015  . Left flank pain 06/27/2015  . Type 2 diabetes mellitus with hyperglycemia, with long-term current use of insulin ([place]) 05/06/2015  . Multinodular goiter 05/06/2015  . Rectocele, female 04/27/2015  . Anal sphincter incontinence 04/27/2015  . Pelvic relaxation due to rectocele 03/30/2015  . Pulmonary hypertension ([place]) 02/22/2015  . Light cigarette smoker (1-9 cigarettes per day) 01/11/2015    . Migraine without aura and without status migrainosus, not intractable 07/02/2014  . Flatulence 02/18/2014  . Microcytic anemia 02/18/2014  . COPD (chronic obstructive pulmonary disease) with chronic bronchitis ([place]) 09/16/2013  . Acute head trauma 08/16/2013  . Hypothyroidism 08/16/2013  . Gastroparesis 04/28/2013  . Seizure disorder ([place]) 01/19/2013  . Cervical disc disorder with radiculopathy of cervical region 10/31/2012  . Solitary pulmonary nodule 08/19/2012  . Anemia 07/05/2012  . 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 Allergic sinusitis 04/18/2012  . Meningioma ([place]) 11/19/2011  . 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 Carpal tunnel syndrome of right wrist 05/23/2011  . Polypharmacy 04/28/2011  . Bipolar disorder ([place]) 04/28/2011  . Constipation 04/13/2011  . Falls frequently 12/12/2010  . Oropharyngeal dysphagia 07/12/2010  . Urinary incontinence 12/16/2009  . HEARING LOSS 10/26/2009  . Hyperlipidemia 12/11/2008  . IBS 12/11/2008  . GERD 07/29/2008  . MILK PRODUCTS ALLERGY 07/29/2008  . 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 Backache 06/19/2007  . Osteoporosis 06/19/2007  . Obstructive sleep apnea 06/19/2007  . TRIGGER FINGER 04/18/2007  . DIVERTICULOSIS, COLON 11/13/2006    Past Surgical History:  Procedure Laterality Date  . ABDOMINAL HYSTERECTOMY  1978  . BACK SURGERY  July 2012  . BACTERIAL OVERGROWTH TEST N/A 05/05/2013   Procedure: BACTERIAL OVERGROWTH TEST;  Surgeon: Daneil Dolin, MD;  Location: [facility];  Service: Endoscopy;  Laterality: N/A;  7:30  . BIOPSY THYROID  2009  . BRAIN SURGERY  11/2011   resection of meningioma  . BREAST REDUCTION SURGERY  1994  . CARDIAC CATHETERIZATION  05/10/2005   normal coronaries, normal LV systolic function and EF (Dr. Jackie Plum)  . CARPAL TUNNEL RELEASE Left 07/22/04   Dr. Aline Brochure  . CATARACT EXTRACTION Bilateral   . CHOLECYSTECTOMY  1984  .  COLONOSCOPY N/A 09/25/2012   PIR:JJOACZY diverticulosis.  colonic polyp-removed : tubular adenoma  . CRANIOTOMY  11/23/2011   Procedure: CRANIOTOMY TUMOR EXCISION;  Surgeon: Hosie Spangle, MD;  Location: [place] NEURO ORS;  Service: Neurosurgery;  Laterality: N/A;  Craniotomy for tumor resection  . ESOPHAGOGASTRODUODENOSCOPY  12/29/2010   Rourk-Retained food in the esophagus and stomach, small hiatal hernia, status post Maloney dilation of the esophagus  . ESOPHAGOGASTRODUODENOSCOPY N/A 09/25/2012   SAY:[id number] atonic baggy esophagus status post Maloney dilation 59 F. Hiatal hernia  . GIVENS CAPSULE STUDY N/A 01/15/2013   NORMAL.   . IR GENERIC HISTORICAL  03/17/2016   IR RADIOLOGIST EVAL & MGMT 03/17/2016 [facility]-INTERV RAD  . LESION REMOVAL N/A 05/31/2015   Procedure: REMOVAL RIGHT AND LEFT LESIONS OF MANDIBLE;  Surgeon: Diona Browner, DDS;  Location: [place];  Service: Oral Surgery;  Laterality: N/A;  . MALONEY DILATION  12/29/2010  RMR;  . NM MYOCAR PERF WALL MOTION  2006   "relavtiely normal" persantine, mild anterior thinning (breast attenuation artifact), no region of scar/ischemia  . OVARIAN CYST REMOVAL    . RECTOCELE REPAIR N/A 06/29/2015   Procedure: POSTERIOR REPAIR (RECTOCELE);  Surgeon: Jonnie Kind, MD;  Location: [facility] ORS;  Service: Gynecology;  Laterality: N/A;  . REDUCTION MAMMAPLASTY Bilateral   . SPINE SURGERY  09/29/2010   Dr. Rolena Infante  . surgical excision of 3 tumors from right thigh and right buttock  and left upper thigh  2010  . TOOTH EXTRACTION Bilateral 12/14/2014   Procedure: REMOVAL OF BILATERAL MANDIBULAR EXOSTOSES;  Surgeon: Diona Browner, DDS;  Location: [place];  Service: Oral Surgery;  Laterality: Bilateral;  . TRANSTHORACIC ECHOCARDIOGRAM  2010   EF 60-65%, mild conc LVH, grade 1 diastolic dysfunction; mildly calcified MV annulus with mildly thickened leaflets, mildly calcified MR annulus     OB History    Gravida  6   Para  1   Term  1   Preterm      AB  5    Living        SAB  5   TAB      Ectopic      Multiple      Live Births               Home Medications    Prior to Admission medications   Medication Sig Start Date End Date Taking? Authorizing Provider  ACCU-CHEK SMARTVIEW test strip TEST BLOOD SUGAR FOUR TIMES DAILY AS DIRECTED 05/08/17   Philemon Kingdom, MD  albuterol (PROVENTIL HFA;VENTOLIN HFA) 108 (90 Base) MCG/ACT inhaler Inhale 1-2 puffs every 6 hours as needed for wheezing, shortness of breath 07/27/16   Baird Lyons D, MD  ascorbic acid (VITAMIN C) 500 MG tablet Take 500 mg by mouth daily.    [provider]  aspirin EC 81 MG tablet Take 81 mg by mouth daily.    [provider]  cetirizine (ZYRTEC) 10 MG tablet Take 1 tablet (10 mg total) by mouth daily. 06/09/17   [name], MD  Cholecalciferol (VITAMIN D3) 5000 units CAPS Take 1 capsule by mouth daily.    [provider]  cloNIDine (CATAPRES) 0.3 MG tablet TAKE 1 TABLET BY MOUTH EVERY EIGHT HOURS. ONCE AT 8AM, 4PM, AND 12 MIDNIGHT. 05/23/17   [name], MD  HYDROcodone-acetaminophen (NORCO/VICODIN) 5-325 MG tablet Take 1 pill every 4-6 hours for pain not relieved by your normal pain medicine 05/31/17   Milton Ferguson, MD  hydroxychloroquine (PLAQUENIL) 200 MG tablet Take 200 mg by mouth daily.     [provider]  insulin aspart (NOVOLOG FLEXPEN) 100 UNIT/ML FlexPen INJECT 10 TO 14 UNITS INTO THE SKIN 3 (THREE) TIMES DAILY WITH MEALS. 06/07/17   Philemon Kingdom, MD  Insulin Glargine (TOUJEO SOLOSTAR) 300 UNIT/ML SOPN Inject 20 Units into the skin at bedtime. 06/07/17   Philemon Kingdom, MD  Insulin Pen Needle (CAREFINE PEN NEEDLES) 32G X 4 MM MISC Use 4x a day 06/07/17   Philemon Kingdom, MD  lamoTRIgine (LAMICTAL) 100 MG tablet TAKE 1 TABLET BY MOUTH TWICE DAILY. 04/02/17   Pieter Partridge, DO  levothyroxine (SYNTHROID, LEVOTHROID) 50 MCG tablet One tab once daily and one half tab on sunday Patient taking  differently: Take 50 mcg by mouth daily before breakfast. One tab once daily and one half tab on sunday 05/23/17   [name], MD  LORazepam (  ATIVAN) 0.5 MG tablet Take 1 tablet (0.5 mg total) by mouth 3 (three) times daily. 04/30/17 04/30/18  Cloria Spring, MD  losartan (COZAAR) 50 MG tablet Take 1 tablet (50 mg total) by mouth daily. 05/23/17   [name], MD  metoprolol tartrate (LOPRESSOR) 50 MG tablet Take 1 tablet (50 mg total) by mouth 2 (two) times daily. Need office visit 06/01/17   Pixie Casino, MD  montelukast (SINGULAIR) 10 MG tablet Take 1 tablet (10 mg total) by mouth daily. 05/23/17   [name], MD  Multiple Vitamins-Minerals (HM MULTIVITAMIN ADULT GUMMY PO) Take 1 tablet by mouth daily.    [provider]  predniSONE (DELTASONE) 5 MG tablet One tablet twice daily for 5 days 06/09/17   [name], MD  pregabalin (LYRICA) 75 MG capsule Take 1 capsule (75 mg total) by mouth daily. 10/20/16   Rexene Alberts, MD  RABEprazole (ACIPHEX) 20 MG tablet TAKE 1 TABLET BY MOUTH TWICE DAILY. 06/01/17   Pyrtle, Lajuan Lines, MD  RESTASIS 0.05 % ophthalmic emulsion Place 1 drop into both eyes 2 (two) times daily.  02/04/14   [provider]  risperiDONE (RISPERDAL) 0.5 MG tablet Take 1 tablet (0.5 mg total) by mouth at bedtime. 04/30/17   Cloria Spring, MD  rosuvastatin (CRESTOR) 5 MG tablet Take 1 tablet (5 mg total) by mouth at bedtime. 05/23/17   [name], MD  sertraline (ZOLOFT) 100 MG tablet Take 1 tablet (100 mg total) by mouth daily. 04/30/17   Cloria Spring, MD  traMADol (ULTRAM) 50 MG tablet Take 1 tablet (50 mg total) by mouth every 6 (six) hours as needed. Patient taking differently: Take 50 mg by mouth 3 (three) times daily as needed for moderate pain.  04/03/17   Julianne Rice, MD  traZODone (DESYREL) 100 MG tablet Take 1 tablet (100 mg total) by mouth at bedtime. Patient taking differently: Take 200 mg by mouth at bedtime.   04/30/17   Cloria Spring, MD    Family History Family History  Problem Relation Age of Onset  . Heart attack Mother        HTN  . Pneumonia Father   . Kidney failure Father   . Diabetes Father   . Pancreatic cancer Sister   . Diabetes Brother   . Hypertension Brother   . Diabetes Brother   . Cancer Sister        breast   . Hypertension Son   . Sleep apnea Son   . Cancer Sister        pancreatic  . Stroke Maternal Grandmother   . Heart attack Maternal Grandfather   . Alcohol abuse Maternal Uncle   . Colon cancer Neg Hx   . Anesthesia problems Neg Hx   . Hypotension Neg Hx   . Malignant hyperthermia Neg Hx   . Pseudochol deficiency Neg Hx     Social History Social History   Tobacco Use  . Smoking status: Former Smoker    Packs/day: 0.25    Years: 7.00    Pack years: 1.75    Types: Cigarettes    Last attempt to quit: 03/13/2017    Years since quitting: 0.2  . Smokeless tobacco: Never Used  . Tobacco comment: continues to smoke - a little less than a 1/4 pack per da as of 05/01/2017  Substance Use Topics  . Alcohol use: No    Alcohol/week: 0.0 oz    Comment:    .  Drug use: No     Allergies   Cephalexin; Iron; Milk-related compounds; Penicillins; and Phenazopyridine hcl   Review of Systems Review of Systems  All other systems reviewed and are negative.    Physical Exam Updated Vital Signs BP (!) 142/66 (BP Location: Right Arm)   Pulse 77   Temp 98.5 F (36.9 C) (Oral)   Resp 17   Ht 1.549 m (5\' 1" )   Wt 68.9 kg (152 lb)   SpO2 96%   BMI 28.72 kg/m   Physical Exam  Constitutional: She appears well-developed and well-nourished. No distress.  HENT:  Head: Normocephalic and atraumatic.  Right Ear: External ear normal.  Left Ear: External ear normal.  Eyes: Conjunctivae are normal. Right eye exhibits no discharge. Left eye exhibits no discharge. No scleral icterus.  Neck: Neck supple. No tracheal deviation present.  Cardiovascular: Normal rate,  regular rhythm and intact distal pulses.  Pulmonary/Chest: Effort normal and breath sounds normal. No stridor. No respiratory distress. She has no wheezes. She has no rales.  Abdominal: Soft. Bowel sounds are normal. She exhibits no distension. There is no tenderness. There is no rebound and no guarding.  Musculoskeletal: She exhibits tenderness. She exhibits no edema.  Palpable soft tissue hematoma in the right flank in the subcutaneous tissue, this is the maximal area of tenderness, no erythema,  Neurological: She is alert. She has normal strength. No cranial nerve deficit (no facial droop, extraocular movements intact, no slurred speech) or sensory deficit. She exhibits normal muscle tone. She displays no seizure activity. Coordination normal.  Skin: Skin is warm and dry. No rash noted.  Psychiatric: She has a normal mood and affect.  Nursing note and vitals reviewed.    ED Treatments / Results  Labs (all labs ordered are listed, but only abnormal results are displayed) Labs Reviewed  URINALYSIS, ROUTINE W REFLEX MICROSCOPIC     Procedures Procedures (including critical care time)  Medications Ordered in ED Medications  HYDROcodone-acetaminophen (NORCO/VICODIN) 5-325 MG per tablet 2 tablet (2 tablets Oral Given 06/10/17 1626)     Initial Impression / Assessment and Plan / ED Course  I have reviewed the triage vital signs and the nursing notes.  Pertinent labs & imaging results that were available during my care of the patient were reviewed by me and considered in my medical decision making (see chart for details).  Clinical Course as of Jun 10 1645  Sun Jun 10, 2017  1644 Patient's urinalysis reassuring.  No signs of urinary tract infection   [JK]    Clinical Course User Index [JK] Dorie Rank, MD    I reviewed the imaging with the patient from her previous visit.  The hematoma was demonstrated on her CT scan.  This correlates with her area of swelling and tenderness.  I  explained her this most likely got a bit larger after her initial injury.  No signs of infections.  She should continue with ice with the discomfort.  She can continue her current medications.  This should slowly improve over time  Final Clinical Impressions(s) / ED Diagnoses   Final diagnoses:  Contusion of soft tissue    ED Discharge Orders    None       Dorie Rank, MD 06/10/17 [phone]

## 2017-06-10 NOTE — Discharge Instructions (Addendum)
Continue your current medications, apply ice to help with the pain and discomfort, symptoms should slowly improve over the next couple of weeks.

## 2017-06-10 NOTE — Assessment & Plan Note (Signed)
Exam and history c/w arthritic changes in the joint

## 2017-06-10 NOTE — ED Triage Notes (Signed)
Patient c/o right lower back pain and knot to right side. Patient states that she has here on 3/21 after falling and had x-rays. Patient told she had bruised rib. Patient states pain not improve. Patient denies any complications with BMs but reports some urinary incontinence.

## 2017-06-10 NOTE — Assessment & Plan Note (Signed)
Pt on multiple antihypertensive meds and bBP often elevated, probably regime is to complex to be followed , will refer her to cardiology for BP eval and also for CV risk assessment as she is at high risk based on co morbidities

## 2017-06-10 NOTE — Assessment & Plan Note (Signed)
Pt noted  On numerous occasions to have excessive drowsiness/ sleepiness, which is deemed multifactorial, medication may play a role, but she also has been  formally diagnosed with mild  sleep apnea and needs to be re evaluated byr her treating MD, will refer

## 2017-06-10 NOTE — Assessment & Plan Note (Signed)
Upcoming appt with dermatology for right forearm rash

## 2017-06-10 NOTE — Assessment & Plan Note (Signed)
Long h/o nicotine use, IDDM, HTN, and hyperlipidemia, needs evaluation for CAD, will refer

## 2017-06-11 DIAGNOSIS — I509 Heart failure, unspecified: Secondary | ICD-10-CM | POA: Diagnosis not present

## 2017-06-11 DIAGNOSIS — I69354 Hemiplegia and hemiparesis following cerebral infarction affecting left non-dominant side: Secondary | ICD-10-CM | POA: Diagnosis not present

## 2017-06-11 DIAGNOSIS — Z794 Long term (current) use of insulin: Secondary | ICD-10-CM | POA: Diagnosis not present

## 2017-06-11 DIAGNOSIS — Z87891 Personal history of nicotine dependence: Secondary | ICD-10-CM | POA: Diagnosis not present

## 2017-06-11 DIAGNOSIS — I10 Essential (primary) hypertension: Secondary | ICD-10-CM | POA: Diagnosis not present

## 2017-06-11 DIAGNOSIS — J449 Chronic obstructive pulmonary disease, unspecified: Secondary | ICD-10-CM | POA: Diagnosis not present

## 2017-06-11 DIAGNOSIS — E1142 Type 2 diabetes mellitus with diabetic polyneuropathy: Secondary | ICD-10-CM | POA: Diagnosis not present

## 2017-06-11 DIAGNOSIS — I110 Hypertensive heart disease with heart failure: Secondary | ICD-10-CM | POA: Diagnosis not present

## 2017-06-11 DIAGNOSIS — R296 Repeated falls: Secondary | ICD-10-CM | POA: Diagnosis not present

## 2017-06-11 DIAGNOSIS — F250 Schizoaffective disorder, bipolar type: Secondary | ICD-10-CM | POA: Diagnosis not present

## 2017-06-12 DIAGNOSIS — I509 Heart failure, unspecified: Secondary | ICD-10-CM | POA: Diagnosis not present

## 2017-06-12 DIAGNOSIS — F250 Schizoaffective disorder, bipolar type: Secondary | ICD-10-CM | POA: Diagnosis not present

## 2017-06-12 DIAGNOSIS — Z87891 Personal history of nicotine dependence: Secondary | ICD-10-CM | POA: Diagnosis not present

## 2017-06-12 DIAGNOSIS — Z794 Long term (current) use of insulin: Secondary | ICD-10-CM | POA: Diagnosis not present

## 2017-06-12 DIAGNOSIS — E1142 Type 2 diabetes mellitus with diabetic polyneuropathy: Secondary | ICD-10-CM | POA: Diagnosis not present

## 2017-06-12 DIAGNOSIS — J449 Chronic obstructive pulmonary disease, unspecified: Secondary | ICD-10-CM | POA: Diagnosis not present

## 2017-06-12 DIAGNOSIS — I110 Hypertensive heart disease with heart failure: Secondary | ICD-10-CM | POA: Diagnosis not present

## 2017-06-12 DIAGNOSIS — R296 Repeated falls: Secondary | ICD-10-CM | POA: Diagnosis not present

## 2017-06-12 DIAGNOSIS — I69354 Hemiplegia and hemiparesis following cerebral infarction affecting left non-dominant side: Secondary | ICD-10-CM | POA: Diagnosis not present

## 2017-06-13 ENCOUNTER — Ambulatory Visit (INDEPENDENT_AMBULATORY_CARE_PROVIDER_SITE_OTHER): Payer: Medicare HMO | Admitting: Psychiatry

## 2017-06-13 DIAGNOSIS — F331 Major depressive disorder, recurrent, moderate: Secondary | ICD-10-CM | POA: Diagnosis not present

## 2017-06-13 NOTE — Progress Notes (Signed)
Patient:  [name]               DOB: [birth year]          MR Number:  [id number]  Location:   [place]:  [address]              [place],  [place],    [place]          Start:  Wednesday 06/13/2017 1:15 PM End:       Wednesday 06/13/2017 2:00 PM    Provider/Observer:     Maurice Small, MSW, LCSW   Chief Complaint:                                                    Depression  Reason For Service:               Drena Ham Catherman is a 67 y.o. female who presents with a long standing history of recurrent periods of depression beginning when she was 88 and her favorite uncle died. She h         as been experiencing increased symptoms of depression recently due to stress in the relationship with her son. Per patient's report, he rarely visits her anymore due to his involvement with an older woman who is controlling. She also reports frustration regarding her knees who is her power of attorn      ey and is very demanding per      patient's report. She reports issues with other family members and states she needs help dealing with her family Patient reports multiple psychiatric hospitlaizations due to depression and suicidal ideations with thel last one occuring in 1997. Patient has participated in outpatient psychotherapy and medication management intermittently since age 65.  She currently is seeing psychiatrist Dr. Harrington Challenger . Prior to this, she was being seen at [facility]. Patient also has had ECT at [facility].    Interventions Strategy:  Supportive  Participation Level:   Active      Participation Quality:  Appropriate      Behavioral Observation:  Casual, well groomed, speech very slow, drowsy   Current Psychosocial Factors: Tension in relationship with son, multiple health issues, concerns about grandson, discord with home health aide  Content of Session:   reviewed symptoms, discussed stressors, facilitated expression of thoughts and feelings regarding stressors, praised and  reinforced patient's efforts to set/maintain boundaries with her friend, discussed effects, discussed patient's fears about her health and encouraged patient to use her walker as well as discussed and used cognitive defusion technique to assist patient cope with ruminating thoughts about her health,   Current Status:   Fluctuations in mood, worry, increased crying spells         Suicidal/Homicidal:    No   Patient Progress:   Patient last was seen about 2-3 weeks ago. She reports falling again since last session. Per patient's report, she bruised her ribs. She says she fell when trying to get out of her bed as she was unable to grip due to satin sheets on bed. Since then, she has discarded those type of sheets and now has sheets that don't have that hazard per her report. She reports PCP mentioned possibility of patient being placed in a home if she continues to have frequent falls. Patient expresses  worry and fear regarding this as she does not want to leave her home or lose her independence. Patient reports decreased stress about friend calling as patient now screens her calls and limits the calls she accepts from friend.   Target Goals:   1.  Learn and implement behavioral strategies to overcome depression.    2.  Verbalize an understanding and resolution of current interpersonal problems.   Last Reviewed:   10/13/2016  Goals Addressed Today:    2  Plan:      Return again in 2 - 3 weeks.  Impression/Diagnosis:   Patient presents with long standing history of recurrent periods  of depression beginning when she was [age] and her favorite uncle died. Patient reports multiple psychiatric hospitlaizations due to depression and suicidal ideations with thel last one occuring in 1997. Patient has participated in outpatient psychotherapy and medication management intermittently since age 46.  She currently is seeing psychiatrist Dr. Harrington Challenger . Prior to this, she was being seen at [facility]. Patient also has  had ECT at [facility]. Symptoms have worsened in recent months due to family stress and have  included depressed mood, anxiety, excessive worry, and tearfulness.   Diagnosis:  Axis I: MDD recurrent episode, moderate          Axis II: Deferred     Gerard Bonus, LCSW 06/13/2017

## 2017-06-15 DIAGNOSIS — R296 Repeated falls: Secondary | ICD-10-CM | POA: Diagnosis not present

## 2017-06-15 DIAGNOSIS — J449 Chronic obstructive pulmonary disease, unspecified: Secondary | ICD-10-CM | POA: Diagnosis not present

## 2017-06-15 DIAGNOSIS — I509 Heart failure, unspecified: Secondary | ICD-10-CM | POA: Diagnosis not present

## 2017-06-15 DIAGNOSIS — Z794 Long term (current) use of insulin: Secondary | ICD-10-CM | POA: Diagnosis not present

## 2017-06-15 DIAGNOSIS — F250 Schizoaffective disorder, bipolar type: Secondary | ICD-10-CM | POA: Diagnosis not present

## 2017-06-15 DIAGNOSIS — I69354 Hemiplegia and hemiparesis following cerebral infarction affecting left non-dominant side: Secondary | ICD-10-CM | POA: Diagnosis not present

## 2017-06-15 DIAGNOSIS — Z87891 Personal history of nicotine dependence: Secondary | ICD-10-CM | POA: Diagnosis not present

## 2017-06-15 DIAGNOSIS — I110 Hypertensive heart disease with heart failure: Secondary | ICD-10-CM | POA: Diagnosis not present

## 2017-06-15 DIAGNOSIS — E1142 Type 2 diabetes mellitus with diabetic polyneuropathy: Secondary | ICD-10-CM | POA: Diagnosis not present

## 2017-06-17 DIAGNOSIS — J449 Chronic obstructive pulmonary disease, unspecified: Secondary | ICD-10-CM | POA: Diagnosis not present

## 2017-06-17 DIAGNOSIS — Z794 Long term (current) use of insulin: Secondary | ICD-10-CM | POA: Diagnosis not present

## 2017-06-17 DIAGNOSIS — I509 Heart failure, unspecified: Secondary | ICD-10-CM | POA: Diagnosis not present

## 2017-06-17 DIAGNOSIS — F250 Schizoaffective disorder, bipolar type: Secondary | ICD-10-CM | POA: Diagnosis not present

## 2017-06-17 DIAGNOSIS — Z87891 Personal history of nicotine dependence: Secondary | ICD-10-CM | POA: Diagnosis not present

## 2017-06-17 DIAGNOSIS — I69354 Hemiplegia and hemiparesis following cerebral infarction affecting left non-dominant side: Secondary | ICD-10-CM | POA: Diagnosis not present

## 2017-06-17 DIAGNOSIS — E1142 Type 2 diabetes mellitus with diabetic polyneuropathy: Secondary | ICD-10-CM | POA: Diagnosis not present

## 2017-06-17 DIAGNOSIS — R296 Repeated falls: Secondary | ICD-10-CM | POA: Diagnosis not present

## 2017-06-17 DIAGNOSIS — I110 Hypertensive heart disease with heart failure: Secondary | ICD-10-CM | POA: Diagnosis not present

## 2017-06-18 ENCOUNTER — Ambulatory Visit (INDEPENDENT_AMBULATORY_CARE_PROVIDER_SITE_OTHER): Payer: Medicare HMO | Admitting: Otolaryngology

## 2017-06-18 ENCOUNTER — Telehealth: Payer: Self-pay | Admitting: Family Medicine

## 2017-06-18 DIAGNOSIS — R040 Epistaxis: Secondary | ICD-10-CM | POA: Diagnosis not present

## 2017-06-18 NOTE — Telephone Encounter (Signed)
Requesting a lift chair be sent to [place]. If you agree, the 3/14 ov note must be addended.

## 2017-06-18 NOTE — Telephone Encounter (Signed)
Ms Seidman came by the office and requested a lift chair.  I let her know this may not be done till next week.  She says she want it ordered from [employer] in [place].  Not sure of this company name.

## 2017-06-19 DIAGNOSIS — Z794 Long term (current) use of insulin: Secondary | ICD-10-CM | POA: Diagnosis not present

## 2017-06-19 DIAGNOSIS — J449 Chronic obstructive pulmonary disease, unspecified: Secondary | ICD-10-CM | POA: Diagnosis not present

## 2017-06-19 DIAGNOSIS — F250 Schizoaffective disorder, bipolar type: Secondary | ICD-10-CM | POA: Diagnosis not present

## 2017-06-19 DIAGNOSIS — Z87891 Personal history of nicotine dependence: Secondary | ICD-10-CM | POA: Diagnosis not present

## 2017-06-19 DIAGNOSIS — I509 Heart failure, unspecified: Secondary | ICD-10-CM | POA: Diagnosis not present

## 2017-06-19 DIAGNOSIS — I69354 Hemiplegia and hemiparesis following cerebral infarction affecting left non-dominant side: Secondary | ICD-10-CM | POA: Diagnosis not present

## 2017-06-19 DIAGNOSIS — R296 Repeated falls: Secondary | ICD-10-CM | POA: Diagnosis not present

## 2017-06-19 DIAGNOSIS — E1142 Type 2 diabetes mellitus with diabetic polyneuropathy: Secondary | ICD-10-CM | POA: Diagnosis not present

## 2017-06-19 DIAGNOSIS — I110 Hypertensive heart disease with heart failure: Secondary | ICD-10-CM | POA: Diagnosis not present

## 2017-06-20 ENCOUNTER — Encounter: Payer: Self-pay | Admitting: *Deleted

## 2017-06-20 ENCOUNTER — Other Ambulatory Visit: Payer: Self-pay | Admitting: *Deleted

## 2017-06-20 NOTE — Patient Outreach (Signed)
[facility]) Care Management   06/20/2017  [name] 1950-05-26 [id number]  [name] is an 67 y.o. female  Subjective: Routine home visit with pt, HIPAA verified, pt reports she is having a good day, "had a fall awhile back, aide CNA continues assisting pt daily, home health PT continues working with pt, pt states she is using walker and wearing life alert necklace at all times.  Pt states home health RN continues to see her q 2 weeks and prefills medications box and this is ongoing service.  Pt states AIC checked last week 5.8   Pt states she continues to see neurologist and other specialists for pain related issues.   Objective:    Vitals:   06/20/17 1355  BP: 118/60  Pulse: 66  Resp: 18  SpO2: 98%  CBG 188 today,  80 last hs 7 day average 128 14 day average 136 30 day average 138 90 day average 149  ROS  Physical Exam  Constitutional: She is oriented to person, place, and time. She appears well-developed and well-nourished.  HENT:  Head: Normocephalic.  Neck: Normal range of motion. Neck supple.  Cardiovascular: Normal rate.  Respiratory: Effort normal and breath sounds normal.  GI: Soft. Bowel sounds are normal.  Musculoskeletal: Normal range of motion. She exhibits no edema.  Neurological: She is alert and oriented to person, place, and time.  Skin: Skin is warm and dry.  Psychiatric: She has a normal mood and affect. Her behavior is normal. Thought content normal.    Encounter Medications:   Outpatient Encounter Medications as of 06/20/2017  Medication Sig  . ACCU-CHEK SMARTVIEW test strip TEST BLOOD SUGAR FOUR TIMES DAILY AS DIRECTED  . albuterol (PROVENTIL HFA;VENTOLIN HFA) 108 (90 Base) MCG/ACT inhaler Inhale 1-2 puffs every 6 hours as needed for wheezing, shortness of breath  . ascorbic acid (VITAMIN C) 500 MG tablet Take 500 mg by mouth daily.  [name] aspirin EC 81 MG tablet Take 81 mg by mouth daily.  . cetirizine (ZYRTEC) 10 MG tablet Take  1 tablet (10 mg total) by mouth daily.  . Cholecalciferol (VITAMIN D3) 5000 units CAPS Take 1 capsule by mouth daily.  . cloNIDine (CATAPRES) 0.3 MG tablet TAKE 1 TABLET BY MOUTH EVERY EIGHT HOURS. ONCE AT 8AM, 4PM, AND 12 MIDNIGHT.  [name] HYDROcodone-acetaminophen (NORCO/VICODIN) 5-325 MG tablet Take 1 pill every 4-6 hours for pain not relieved by your normal pain medicine  . hydroxychloroquine (PLAQUENIL) 200 MG tablet Take 200 mg by mouth daily.   . insulin aspart (NOVOLOG FLEXPEN) 100 UNIT/ML FlexPen INJECT 10 TO 14 UNITS INTO THE SKIN 3 (THREE) TIMES DAILY WITH MEALS.  [name] Insulin Glargine (TOUJEO SOLOSTAR) 300 UNIT/ML SOPN Inject 20 Units into the skin at bedtime.  . Insulin Pen Needle (CAREFINE PEN NEEDLES) 32G X 4 MM MISC Use 4x a day  . lamoTRIgine (LAMICTAL) 100 MG tablet TAKE 1 TABLET BY MOUTH TWICE DAILY.  [name] levothyroxine (SYNTHROID, LEVOTHROID) 50 MCG tablet One tab once daily and one half tab on sunday (Patient taking differently: Take 50 mcg by mouth daily before breakfast. One tab once daily and one half tab on sunday)  . LORazepam (ATIVAN) 0.5 MG tablet Take 1 tablet (0.5 mg total) by mouth 3 (three) times daily.  [name] losartan (COZAAR) 50 MG tablet Take 1 tablet (50 mg total) by mouth daily.  . metoprolol tartrate (LOPRESSOR) 50 MG tablet Take 1 tablet (50 mg total) by mouth 2 (two) times daily. Need  office visit  . montelukast (SINGULAIR) 10 MG tablet Take 1 tablet (10 mg total) by mouth daily.  . Multiple Vitamins-Minerals (HM MULTIVITAMIN ADULT GUMMY PO) Take 1 tablet by mouth daily.  . pregabalin (LYRICA) 75 MG capsule Take 1 capsule (75 mg total) by mouth daily.  . RABEprazole (ACIPHEX) 20 MG tablet TAKE 1 TABLET BY MOUTH TWICE DAILY.  [name] RESTASIS 0.05 % ophthalmic emulsion Place 1 drop into both eyes 2 (two) times daily.   . risperiDONE (RISPERDAL) 0.5 MG tablet Take 1 tablet (0.5 mg total) by mouth at bedtime.  . rosuvastatin (CRESTOR) 5 MG tablet Take 1 tablet (5 mg total) by mouth  at bedtime.  . sertraline (ZOLOFT) 100 MG tablet Take 1 tablet (100 mg total) by mouth daily.  . traMADol (ULTRAM) 50 MG tablet Take 1 tablet (50 mg total) by mouth every 6 (six) hours as needed. (Patient taking differently: Take 50 mg by mouth 3 (three) times daily as needed for moderate pain. )  . traZODone (DESYREL) 100 MG tablet Take 1 tablet (100 mg total) by mouth at bedtime. (Patient taking differently: Take 200 mg by mouth at bedtime. )  . predniSONE (DELTASONE) 5 MG tablet One tablet twice daily for 5 days (Patient not taking: Reported on 06/20/2017)   No facility-administered encounter medications on file as of 06/20/2017.     Functional Status:   In your present state of health, do you have any difficulty performing the following activities: 04/30/2017  Hearing? N  Vision? N  Difficulty concentrating or making decisions? N  Walking or climbing stairs? Y  Dressing or bathing? Y  Doing errands, shopping? Y  Preparing Food and eating ? N  Using the Toilet? N  In the past six months, have you accidently leaked urine? Y  Do you have problems with loss of bowel control? N  Managing your Medications? Y  Managing your Finances? Y  Housekeeping or managing your Housekeeping? Y  Some recent data might be hidden    Fall/Depression Screening:    Fall Risk  06/20/2017 04/30/2017 04/11/2017  Falls in the past year? Yes Yes Yes  Number falls in past yr: 2 or more 2 or more 2 or more  Injury with Fall? No No No  Comment - - -  Risk Factor Category  High Fall Risk High Fall Risk High Fall Risk  Risk for fall due to : History of fall(s);Impaired balance/gait;Impaired mobility;Medication side effect History of fall(s);Impaired balance/gait;Medication side effect;Impaired mobility History of fall(s);Impaired balance/gait;Impaired mobility;Impaired vision;Medication side effect  Follow up Education provided;Falls evaluation completed;Falls prevention discussed Education provided;Falls evaluation  completed;Falls prevention discussed Falls prevention discussed;Education provided   [facility] 2/9 Scores 04/30/2017 04/11/2017 12/06/2016 09/25/2016 04/21/2015 03/19/2015 02/10/2015  PHQ - 2 Score 1 1 2 2 1 2  0  PHQ- 9 Score - - 5 9 - 9 -    Assessment:  RN CM reviewed medications with pt,  Reinforced safety precautions and importance of using assistive devices at all times.  RN CM reviewed discharge plan with pt and agreed upon discharge today, goals met.  RN CM faxed case closure letter to primary MD Dr. Tula Nakayama, mailed case closure letter to patient's home.  THN CM Care Plan Problem One     Most Recent Value  Care Plan Problem One  Pt high risk for falls  Role Documenting the Problem One  Care Management [place] for Problem One  Active  THN Long Term Goal   Pt  will have no falls and demonstrate safety precautions within 60 days  THN Long Term Goal Start Date  06/06/17 [goal re-established, pt had fall 05/31/17]  THN Long Term Goal Met Date  06/20/17  Interventions for Problem One Long Term Goal  RN CM reinforced safety precautions and importance of always using assistive devices, pt is wearing Life Alert Necklace and using walker during visit  THN CM Short Term Goal #1   Pt will work with home health PT and complete prescribed exercises within 30 days.  THN CM Short Term Goal #1 Start Date  06/06/17 [goal re-established]  THN CM Short Term Goal #1 Met Date  06/20/17  Interventions for Short Term Goal #1  Pt is working with home health PT and is trying to complete exercises daily if possible  THN CM Short Term Goal #2   Pt will verbalize/ demonstrate safety precautions within 30 days  THN CM Short Term Goal #2 Start Date  06/06/17  [facility] CM Short Term Goal #2 Met Date  06/20/17  Interventions for Short Term Goal #2  RN CM reviewed importance of pt asking for assistance as needed, pt has aide/ sitter services with her daily, reinforced safety precautions    THN CM Care Plan  Problem Two     Most Recent Value  Care Plan Problem Two  Pain not effectively controlled  Role Documenting the Problem Two  Care Management [place] for Problem Two  Active  THN CM Short Term Goal #1   Pt will verbalize pain management strategies within 30 days.  THN CM Short Term Goal #1 Start Date  06/06/17 [goal re-established]  THN CM Short Term Goal #1 Met Date   06/20/17  Interventions for Short Term Goal #2   RN CM encouraged pt to continue keeping all appointments and working with her doctors regarding pain management, reviewed importance of taking pain medication as prescribed, reviewed other strategies such as relaxation.      Plan: Discharge today  Jacqlyn Larsen [facility], BSN [place] Coordinator [phone]

## 2017-06-21 DIAGNOSIS — E1142 Type 2 diabetes mellitus with diabetic polyneuropathy: Secondary | ICD-10-CM | POA: Diagnosis not present

## 2017-06-21 DIAGNOSIS — Z794 Long term (current) use of insulin: Secondary | ICD-10-CM | POA: Diagnosis not present

## 2017-06-21 DIAGNOSIS — I110 Hypertensive heart disease with heart failure: Secondary | ICD-10-CM | POA: Diagnosis not present

## 2017-06-21 DIAGNOSIS — R296 Repeated falls: Secondary | ICD-10-CM | POA: Diagnosis not present

## 2017-06-21 DIAGNOSIS — F250 Schizoaffective disorder, bipolar type: Secondary | ICD-10-CM | POA: Diagnosis not present

## 2017-06-21 DIAGNOSIS — I509 Heart failure, unspecified: Secondary | ICD-10-CM | POA: Diagnosis not present

## 2017-06-21 DIAGNOSIS — Z87891 Personal history of nicotine dependence: Secondary | ICD-10-CM | POA: Diagnosis not present

## 2017-06-21 DIAGNOSIS — I69354 Hemiplegia and hemiparesis following cerebral infarction affecting left non-dominant side: Secondary | ICD-10-CM | POA: Diagnosis not present

## 2017-06-21 DIAGNOSIS — J449 Chronic obstructive pulmonary disease, unspecified: Secondary | ICD-10-CM | POA: Diagnosis not present

## 2017-06-25 DIAGNOSIS — J449 Chronic obstructive pulmonary disease, unspecified: Secondary | ICD-10-CM | POA: Diagnosis not present

## 2017-06-25 DIAGNOSIS — E1142 Type 2 diabetes mellitus with diabetic polyneuropathy: Secondary | ICD-10-CM | POA: Diagnosis not present

## 2017-06-25 DIAGNOSIS — F250 Schizoaffective disorder, bipolar type: Secondary | ICD-10-CM | POA: Diagnosis not present

## 2017-06-25 DIAGNOSIS — I69354 Hemiplegia and hemiparesis following cerebral infarction affecting left non-dominant side: Secondary | ICD-10-CM | POA: Diagnosis not present

## 2017-06-25 DIAGNOSIS — R296 Repeated falls: Secondary | ICD-10-CM | POA: Diagnosis not present

## 2017-06-25 DIAGNOSIS — Z87891 Personal history of nicotine dependence: Secondary | ICD-10-CM | POA: Diagnosis not present

## 2017-06-25 DIAGNOSIS — I509 Heart failure, unspecified: Secondary | ICD-10-CM | POA: Diagnosis not present

## 2017-06-25 DIAGNOSIS — I110 Hypertensive heart disease with heart failure: Secondary | ICD-10-CM | POA: Diagnosis not present

## 2017-06-25 DIAGNOSIS — Z794 Long term (current) use of insulin: Secondary | ICD-10-CM | POA: Diagnosis not present

## 2017-06-26 ENCOUNTER — Inpatient Hospital Stay (HOSPITAL_COMMUNITY): Payer: Medicare HMO

## 2017-06-26 ENCOUNTER — Encounter (HOSPITAL_COMMUNITY): Payer: Self-pay | Admitting: Hematology

## 2017-06-26 ENCOUNTER — Inpatient Hospital Stay (HOSPITAL_COMMUNITY): Payer: Medicare HMO | Attending: Hematology | Admitting: Hematology

## 2017-06-26 VITALS — BP 151/70 | HR 71 | Temp 97.6°F | Resp 20 | Wt 153.6 lb

## 2017-06-26 DIAGNOSIS — K589 Irritable bowel syndrome without diarrhea: Secondary | ICD-10-CM | POA: Diagnosis not present

## 2017-06-26 DIAGNOSIS — D509 Iron deficiency anemia, unspecified: Secondary | ICD-10-CM | POA: Insufficient documentation

## 2017-06-26 DIAGNOSIS — E039 Hypothyroidism, unspecified: Secondary | ICD-10-CM

## 2017-06-26 DIAGNOSIS — F419 Anxiety disorder, unspecified: Secondary | ICD-10-CM | POA: Insufficient documentation

## 2017-06-26 DIAGNOSIS — F329 Major depressive disorder, single episode, unspecified: Secondary | ICD-10-CM | POA: Insufficient documentation

## 2017-06-26 DIAGNOSIS — M797 Fibromyalgia: Secondary | ICD-10-CM | POA: Diagnosis not present

## 2017-06-26 DIAGNOSIS — F319 Bipolar disorder, unspecified: Secondary | ICD-10-CM | POA: Diagnosis not present

## 2017-06-26 DIAGNOSIS — G4700 Insomnia, unspecified: Secondary | ICD-10-CM | POA: Diagnosis not present

## 2017-06-26 DIAGNOSIS — Z794 Long term (current) use of insulin: Secondary | ICD-10-CM

## 2017-06-26 DIAGNOSIS — Z7982 Long term (current) use of aspirin: Secondary | ICD-10-CM | POA: Insufficient documentation

## 2017-06-26 DIAGNOSIS — G4730 Sleep apnea, unspecified: Secondary | ICD-10-CM

## 2017-06-26 DIAGNOSIS — I10 Essential (primary) hypertension: Secondary | ICD-10-CM | POA: Diagnosis not present

## 2017-06-26 DIAGNOSIS — E119 Type 2 diabetes mellitus without complications: Secondary | ICD-10-CM | POA: Insufficient documentation

## 2017-06-26 DIAGNOSIS — K5900 Constipation, unspecified: Secondary | ICD-10-CM | POA: Diagnosis not present

## 2017-06-26 DIAGNOSIS — J449 Chronic obstructive pulmonary disease, unspecified: Secondary | ICD-10-CM | POA: Insufficient documentation

## 2017-06-26 DIAGNOSIS — Z86010 Personal history of colonic polyps: Secondary | ICD-10-CM | POA: Insufficient documentation

## 2017-06-26 DIAGNOSIS — Z87891 Personal history of nicotine dependence: Secondary | ICD-10-CM | POA: Diagnosis not present

## 2017-06-26 DIAGNOSIS — Z79899 Other long term (current) drug therapy: Secondary | ICD-10-CM

## 2017-06-26 DIAGNOSIS — E785 Hyperlipidemia, unspecified: Secondary | ICD-10-CM | POA: Insufficient documentation

## 2017-06-26 DIAGNOSIS — D649 Anemia, unspecified: Secondary | ICD-10-CM | POA: Insufficient documentation

## 2017-06-26 DIAGNOSIS — M810 Age-related osteoporosis without current pathological fracture: Secondary | ICD-10-CM | POA: Diagnosis not present

## 2017-06-26 DIAGNOSIS — Z8673 Personal history of transient ischemic attack (TIA), and cerebral infarction without residual deficits: Secondary | ICD-10-CM | POA: Diagnosis not present

## 2017-06-26 DIAGNOSIS — G40909 Epilepsy, unspecified, not intractable, without status epilepticus: Secondary | ICD-10-CM

## 2017-06-26 DIAGNOSIS — M129 Arthropathy, unspecified: Secondary | ICD-10-CM

## 2017-06-26 DIAGNOSIS — K219 Gastro-esophageal reflux disease without esophagitis: Secondary | ICD-10-CM | POA: Insufficient documentation

## 2017-06-26 LAB — CBC
HCT: 34.6 % — ABNORMAL LOW (ref 36.0–46.0)
HEMOGLOBIN: 9.9 g/dL — AB (ref 12.0–15.0)
MCH: 20.8 pg — ABNORMAL LOW (ref 26.0–34.0)
MCHC: 28.6 g/dL — ABNORMAL LOW (ref 30.0–36.0)
MCV: 72.7 fL — ABNORMAL LOW (ref 78.0–100.0)
Platelets: 239 10*3/uL (ref 150–400)
RBC: 4.76 MIL/uL (ref 3.87–5.11)
RDW: 17.2 % — ABNORMAL HIGH (ref 11.5–15.5)
WBC: 6.5 10*3/uL (ref 4.0–10.5)

## 2017-06-26 LAB — IRON AND TIBC
IRON: 36 ug/dL (ref 28–170)
SATURATION RATIOS: 8 % — AB (ref 10.4–31.8)
TIBC: 463 ug/dL — AB (ref 250–450)
UIBC: 427 ug/dL

## 2017-06-26 LAB — COMPREHENSIVE METABOLIC PANEL
ALK PHOS: 127 U/L — AB (ref 38–126)
ALT: 30 U/L (ref 14–54)
AST: 31 U/L (ref 15–41)
Albumin: 3.8 g/dL (ref 3.5–5.0)
Anion gap: 11 (ref 5–15)
BUN: 25 mg/dL — AB (ref 6–20)
CALCIUM: 9.1 mg/dL (ref 8.9–10.3)
CHLORIDE: 109 mmol/L (ref 101–111)
CO2: 25 mmol/L (ref 22–32)
CREATININE: 1.04 mg/dL — AB (ref 0.44–1.00)
GFR calc Af Amer: >60 mL/min (ref >60)
GFR, EST NON AFRICAN AMERICAN: 54 mL/min — AB (ref >60)
Glucose, Bld: 115 mg/dL — ABNORMAL HIGH (ref 65–99)
Potassium: 4 mmol/L (ref 3.5–5.1)
Sodium: 145 mmol/L (ref 135–145)
Total Bilirubin: 0.5 mg/dL (ref 0.3–1.2)
Total Protein: 7.6 g/dL (ref 6.5–8.1)

## 2017-06-26 LAB — VITAMIN B12: Vitamin B-12: 1524 pg/mL — ABNORMAL HIGH (ref 180–914)

## 2017-06-26 LAB — FOLATE: Folate: 39 ng/mL (ref >5.9)

## 2017-06-26 LAB — FERRITIN: FERRITIN: 14 ng/mL (ref 11–307)

## 2017-06-26 LAB — RETICULOCYTES
RBC.: 4.76 MIL/uL (ref 3.87–5.11)
RETIC COUNT ABSOLUTE: 80.9 10*3/uL (ref 19.0–186.0)
Retic Ct Pct: 1.7 % (ref 0.4–3.1)

## 2017-06-26 LAB — LACTATE DEHYDROGENASE: LDH: 216 U/L — ABNORMAL HIGH (ref 98–192)

## 2017-06-26 NOTE — Progress Notes (Signed)
CONSULT NOTE  Patient Care Team: [name], MD as PCP - General Rourk, Cristopher Estimable, MD (Gastroenterology) Deneise Lever, MD as Consulting Physician (Pulmonary Disease) Jovita Gamma, MD as Consulting Physician (Neurosurgery) Suella Broad, MD as Consulting Physician (Physical Medicine and Rehabilitation) Pieter Partridge, DO as Consulting Physician (Neurology) Lavonna Monarch, MD as Consulting Physician (Dermatology) Leta Baptist, MD as Consulting Physician (Otolaryngology) Latanya Maudlin, MD as Consulting Physician (Orthopedic Surgery) Cloria Spring, MD as Consulting Physician ([place]) Melina Schools, MD as Consulting Physician (Orthopedic Surgery) Lendon Colonel, NP as Nurse Practitioner (Nurse Practitioner) Milus Banister, MD as Attending Physician (Gastroenterology) Pyrtle, Lajuan Lines, MD as Consulting Physician (Gastroenterology) Monico Hoar, PT as Physical Therapist (Physical Therapy) Conrad [place], MD as Consulting Physician (Vascular Surgery) Inocencio Homes, DPM as Consulting Physician (Podiatry) Newt Minion, MD as Consulting Physician (Orthopedic Surgery)  CHIEF COMPLAINTS/PURPOSE OF CONSULTATION:  Microcytic anemia.  HISTORY OF PRESENTING ILLNESS:  [name] 67 y.o. female is seen in consultation today for further workup and management of microcytic anemia.  She was found to have an abnormal CBC on 05/19/2017 when her hemoglobin was 8.6 and MCV over 72.  White count and platelet count were within normal limits.  She reports feeling very fatigued and has occasional lightheadedness.  Her last blood transfusion was in 2012.  She was treated with intravenous iron in the past.  She denies any bleeding per rectum but has occasional dark stools.  She has been placed on iron pills for the last few months.  She gets severely constipated when she takes them.  Denies any fevers, night sweats or significant weight loss.  Denies any pica.  Her last EGD and  colonoscopy were in November 2017.  She lives by herself at home and walks with the help of walker.  She has an aide during the daytime, helping her with cooking and other daily chores.    MEDICAL HISTORY:  Past Medical History:  Diagnosis Date  . Anemia   . Anxiety    takes Ativan daily  . Arthritis   . Bipolar disorder ([place])    takes Risperdal nightly  . Blood transfusion   . Cancer ([place])    In her gum  . Carpal tunnel syndrome of right wrist 05/23/2011  . Cervical disc disorder with radiculopathy of cervical region 10/31/2012  . Chronic back pain   . Chronic idiopathic constipation   . Colon polyps   . COPD (chronic obstructive pulmonary disease) with chronic bronchitis ([place]) 09/16/2013   Office Spirometry 10/30/2013-submaximal effort based on appearance of loop and curve. Numbers would fit with severe restriction but her physiologic capability may be better than this. FVC 0.91/44%, and 10.74/45%, FEV1/FVC 0.81, FEF 25-75% 1.43/69%    . Depression    takes Zoloft daily  . Diabetes mellitus    Type II  . Diverticulosis    TCS 9/08 by Dr. Delfin Edis for diarrhea . Bx for micro scopic colitis negative.   . Fibromyalgia   . Frequent falls   . GERD (gastroesophageal reflux disease)    takes Aciphex daily  . Glaucoma    eye drops daily  . Gum symptoms    infection on antibiotic  . Hemiplegia affecting non-dominant side, post-stroke 08/02/2011  . Hyperlipidemia    takes Crestor daily  . Hypertension    takes Amlodipine,Metoprolol,and Clonidine daily  . Hypothyroidism    takes Synthroid daily  . IBS (irritable bowel syndrome)   .  Insomnia    takes Trazodone nightly  . 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 Metabolic encephalopathy 4/74/2595  . Migraines    chronic headaches  . Mononeuritis lower limb   . Narcolepsy   . Osteoporosis   . Pancreatitis 2006   due to Depakote with normal EUS   . Schatzki's ring    non critical / EGD with ED 8/2011with RMR  . Seizures ([place])     takes Lamictal daily.Last seizure 3 yrs ago  . Sleep apnea    on CPAP  . Stroke [facility])    left sided weakness  . Tubular adenoma of colon     SURGICAL HISTORY: Past Surgical History:  Procedure Laterality Date  . ABDOMINAL HYSTERECTOMY  1978  . BACK SURGERY  July 2012  . BACTERIAL OVERGROWTH TEST N/A 05/05/2013   Procedure: BACTERIAL OVERGROWTH TEST;  Surgeon: Daneil Dolin, MD;  [facility];  Service: Endoscopy;  Laterality: N/A;  7:30  . BIOPSY THYROID  2009  . BRAIN SURGERY  11/2011   resection of meningioma  . BREAST REDUCTION SURGERY  1994  . CARDIAC CATHETERIZATION  05/10/2005   normal coronaries, normal LV systolic function and EF (Dr. Jackie Plum)  . CARPAL TUNNEL RELEASE Left 07/22/04   Dr. Aline Brochure  . CATARACT EXTRACTION Bilateral   . CHOLECYSTECTOMY  1984  . COLONOSCOPY N/A 09/25/2012   GLO:VFIEPPI diverticulosis.  colonic polyp-removed : tubular adenoma  . CRANIOTOMY  11/23/2011   Procedure: CRANIOTOMY TUMOR EXCISION;  Surgeon: Hosie Spangle, MD;  Location: [place] [facility]URO ORS;  Service: Neurosurgery;  Laterality: N/A;  Craniotomy for tumor resection  . ESOPHAGOGASTRODUODENOSCOPY  12/29/2010   Rourk-Retained food in the esophagus and stomach, small hiatal hernia, status post Maloney dilation of the esophagus  . ESOPHAGOGASTRODUODENOSCOPY N/A 09/25/2012   RJJ:OACZYSAY atonic baggy esophagus status post Maloney dilation 55 F. Hiatal hernia  . GIVENS CAPSULE STUDY N/A 01/15/2013   NORMAL.   . IR GENERIC HISTORICAL  03/17/2016   IR RADIOLOGIST EVAL & MGMT 03/17/2016 [facility]-INTERV RAD  . LESION REMOVAL N/A 05/31/2015   Procedure: REMOVAL RIGHT AND LEFT LESIONS OF MANDIBLE;  Surgeon: Diona Browner, DDS;  Location: [place];  Service: Oral Surgery;  Laterality: N/A;  . MALONEY DILATION  12/29/2010   RMR;  . NM MYOCAR PERF WALL MOTION  2006   "relavtiely normal" persantine, mild anterior thinning (breast attenuation artifact), no region of scar/ischemia  . OVARIAN CYST REMOVAL     . RECTOCELE REPAIR N/A 06/29/2015   Procedure: POSTERIOR REPAIR (RECTOCELE);  Surgeon: Jonnie Kind, MD;  Location: [facility];  Service: Gynecology;  Laterality: N/A;  . REDUCTION MAMMAPLASTY Bilateral   . SPINE SURGERY  09/29/2010   Dr. Rolena Infante  . surgical excision of 3 tumors from right thigh and right buttock  and left upper thigh  2010  . TOOTH EXTRACTION Bilateral 12/14/2014   Procedure: REMOVAL OF BILATERAL MANDIBULAR EXOSTOSES;  Surgeon: Diona Browner, DDS;  Location: [place];  Service: Oral Surgery;  Laterality: Bilateral;  . TRANSTHORACIC ECHOCARDIOGRAM  2010   EF 60-65%, mild conc LVH, grade 1 diastolic dysfunction; mildly calcified MV annulus with mildly thickened leaflets, mildly calcified MR annulus    SOCIAL HISTORY: Social History   Socioeconomic History  . Marital status: Divorced    Spouse name: Not on file  . Number of children: 1  . Years of education: 52  . Highest education level: Not on file  Occupational History  . Occupation: Disabled  Social Needs  .  Financial resource strain: Not on file  . Food insecurity:    Worry: Not on file    Inability: Not on file  . Transportation needs:    Medical: Not on file    Non-medical: Not on file  Tobacco Use  . Smoking status: Former Smoker    Packs/day: 0.25    Years: 7.00    Pack years: 1.75    Types: Cigarettes    Last attempt to quit: 03/13/2017    Years since quitting: 0.2  . Smokeless tobacco: Never Used  . Tobacco comment: continues to smoke - a little less than a 1/4 pack per da as of 05/01/2017  Substance and Sexual Activity  . Alcohol use: No    Alcohol/week: 0.0 oz    Comment:    . Drug use: No  . Sexual activity: Not Currently  Lifestyle  . Physical activity:    Days per week: Not on file    Minutes per session: Not on file  . Stress: Not on file  Relationships  . Social connections:    Talks on phone: Not on file    Gets together: Not on file    Attends religious service: Not on file    Active  member of club or organization: Not on file    Attends meetings of clubs or organizations: Not on file    Relationship status: Not on file  . Intimate partner violence:    Fear of current or ex partner: Not on file    Emotionally abused: Not on file    Physically abused: Not on file    Forced sexual activity: Not on file  Other Topics Concern  . Not on file  Social History Narrative   Lives alone   Caffeine use: Drink coffee sometimes    Right handed     FAMILY HISTORY: Family History  Problem Relation Age of Onset  . Heart attack Mother        HTN  . Pneumonia Father   . Kidney failure Father   . Diabetes Father   . Pancreatic cancer Sister   . Diabetes Brother   . Hypertension Brother   . Diabetes Brother   . Cancer Sister        breast   . Hypertension Son   . Sleep apnea Son   . Cancer Sister        pancreatic  . Stroke Maternal Grandmother   . Heart attack Maternal Grandfather   . Alcohol abuse Maternal Uncle   . Colon cancer Neg Hx   . Anesthesia problems Neg Hx   . Hypotension Neg Hx   . Malignant hyperthermia Neg Hx   . Pseudochol deficiency Neg Hx     ALLERGIES:  is allergic to cephalexin; iron; milk-related compounds; penicillins; and phenazopyridine hcl.  MEDICATIONS:  Current Outpatient Medications  Medication Sig Dispense Refill  . ACCU-CHEK SMARTVIEW test strip TEST BLOOD SUGAR FOUR TIMES DAILY AS DIRECTED 350 each 2  . albuterol (PROVENTIL HFA;VENTOLIN HFA) 108 (90 Base) MCG/ACT inhaler Inhale 1-2 puffs every 6 hours as needed for wheezing, shortness of breath 3 Inhaler 3  . ascorbic acid (VITAMIN C) 500 MG tablet Take 500 mg by mouth daily.    [name] aspirin EC 81 MG tablet Take 81 mg by mouth daily.    . cetirizine (ZYRTEC) 10 MG tablet Take 1 tablet (10 mg total) by mouth daily. 30 tablet 1  . Cholecalciferol (VITAMIN D3) 5000 units CAPS Take 1 capsule by  mouth daily.    . cloNIDine (CATAPRES) 0.3 MG tablet TAKE 1 TABLET BY MOUTH EVERY EIGHT HOURS.  ONCE AT 8AM, 4PM, AND 12 MIDNIGHT. 270 tablet 1  . HYDROcodone-acetaminophen (NORCO/VICODIN) 5-325 MG tablet Take 1 pill every 4-6 hours for pain not relieved by your normal pain medicine 15 tablet 0  . hydroxychloroquine (PLAQUENIL) 200 MG tablet Take 200 mg by mouth daily.     . insulin aspart (NOVOLOG FLEXPEN) 100 UNIT/ML FlexPen INJECT 10 TO 14 UNITS INTO THE SKIN 3 (THREE) TIMES DAILY WITH MEALS. 45 mL 3  . Insulin Glargine (TOUJEO SOLOSTAR) 300 UNIT/ML SOPN Inject 20 Units into the skin at bedtime. 9 pen 3  . Insulin Pen Needle (CAREFINE PEN NEEDLES) 32G X 4 MM MISC Use 4x a day 300 each 3  . lamoTRIgine (LAMICTAL) 100 MG tablet TAKE 1 TABLET BY MOUTH TWICE DAILY. 60 tablet 0  . levothyroxine (SYNTHROID, LEVOTHROID) 50 MCG tablet One tab once daily and one half tab on sunday (Patient taking differently: Take 50 mcg by mouth daily before breakfast. One tab once daily and one half tab on sunday) 90 tablet 1  . LORazepam (ATIVAN) 0.5 MG tablet Take 1 tablet (0.5 mg total) by mouth 3 (three) times daily. 270 tablet 2  . losartan (COZAAR) 50 MG tablet Take 1 tablet (50 mg total) by mouth daily. 90 tablet 1  . metoprolol tartrate (LOPRESSOR) 50 MG tablet Take 1 tablet (50 mg total) by mouth 2 (two) times daily. Need office visit 15 tablet 0  . montelukast (SINGULAIR) 10 MG tablet Take 1 tablet (10 mg total) by mouth daily. 90 tablet 1  . Multiple Vitamins-Minerals (HM MULTIVITAMIN ADULT GUMMY PO) Take 1 tablet by mouth daily.    . predniSONE (DELTASONE) 5 MG tablet One tablet twice daily for 5 days 10 tablet 0  . pregabalin (LYRICA) 75 MG capsule Take 1 capsule (75 mg total) by mouth daily.    . RABEprazole (ACIPHEX) 20 MG tablet TAKE 1 TABLET BY MOUTH TWICE DAILY. 180 tablet 0  . RESTASIS 0.05 % ophthalmic emulsion Place 1 drop into both eyes 2 (two) times daily.     . risperiDONE (RISPERDAL) 0.5 MG tablet Take 1 tablet (0.5 mg total) by mouth at bedtime. 90 tablet 2  . rosuvastatin (CRESTOR) 5 MG  tablet Take 1 tablet (5 mg total) by mouth at bedtime. 90 tablet 1  . sertraline (ZOLOFT) 100 MG tablet Take 1 tablet (100 mg total) by mouth daily. 90 tablet 2  . traMADol (ULTRAM) 50 MG tablet Take 1 tablet (50 mg total) by mouth every 6 (six) hours as needed. (Patient taking differently: Take 50 mg by mouth 3 (three) times daily as needed for moderate pain. ) 15 tablet 0  . traZODone (DESYREL) 100 MG tablet Take 1 tablet (100 mg total) by mouth at bedtime. (Patient taking differently: Take 200 mg by mouth at bedtime. ) 90 tablet 2   No current facility-administered medications for this visit.     REVIEW OF SYSTEMS:   Constitutional: Denies fevers, chills or abnormal night sweats Eyes: Denies blurriness of vision, double vision or watery eyes Ears, nose, mouth, throat, and face: Denies mucositis or sore throat Respiratory: Denies cough, dyspnea or wheezes Cardiovascular: Denies palpitation, chest discomfort or lower extremity swelling.  Complains of occasional lightheadedness.  Has shortness of breath on exertion. Gastrointestinal:  Denies nausea, heartburn or change in bowel habits.  Complains of constipation when she takes iron pills. Skin: Denies abnormal  skin rashes Lymphatics: Denies new lymphadenopathy or easy bruising Neurological:Denies numbness, tingling or new weaknesses Behavioral/Psych: Mood is stable, no new changes  All other systems were reviewed with the patient and are negative.  PHYSICAL EXAMINATION: ECOG PERFORMANCE STATUS: 2 - Symptomatic, <50% confined to bed  Vitals:   06/26/17 1126  BP: (!) 151/70  Pulse: 71  Resp: 20  Temp: 97.6 F (36.4 C)  SpO2: 100%   Filed Weights   06/26/17 1126  Weight: 153 lb 9.6 oz (69.7 kg)    GENERAL:alert, no distress and comfortable SKIN: skin color, texture, turgor are normal, no rashes or significant lesions EYES: normal, conjunctiva are pink and non-injected, sclera clear OROPHARYNX:no exudate, no erythema and lips,  buccal mucosa, and tongue normal  NECK: supple, thyroid normal size, non-tender, without nodularity LYMPH:  no palpable lymphadenopathy in the cervical, axillary or inguinal LUNGS: clear to auscultation and percussion with normal breathing effort HEART: regular rate & rhythm and no murmurs and no lower extremity edema ABDOMEN:abdomen soft, non-tender and normal bowel sounds Musculoskeletal:no cyanosis of digits and no clubbing  PSYCH: alert & oriented x 3 with fluent speech NEURO: no focal motor/sensory deficits  LABORATORY DATA:  I have reviewed the data as listed Recent Results (from the past 2160 hour(s))  Urinalysis, Routine w reflex microscopic     Status: Abnormal   Collection Time: 03/31/17 11:52 AM  Result Value Ref Range   Color, Urine YELLOW YELLOW   APPearance HAZY (A) CLEAR   Specific Gravity, Urine 1.026 1.005 - 1.030   pH 5.0 5.0 - 8.0   Glucose, UA NEGATIVE NEGATIVE mg/dL   Hgb urine dipstick NEGATIVE NEGATIVE   Bilirubin Urine NEGATIVE NEGATIVE   Ketones, ur NEGATIVE NEGATIVE mg/dL   Protein, ur 30 (A) NEGATIVE mg/dL   Nitrite NEGATIVE NEGATIVE   Leukocytes, UA TRACE (A) NEGATIVE   RBC / HPF 0-5 0 - 5 RBC/hpf   WBC, UA 0-5 0 - 5 WBC/hpf   Bacteria, UA RARE (A) NONE SEEN   Squamous Epithelial / LPF 6-30 (A) NONE SEEN   Mucus PRESENT   I-Stat Chem 8, ED     Status: Abnormal   Collection Time: 03/31/17 12:32 PM  Result Value Ref Range   Sodium 142 135 - 145 mmol/L   Potassium 4.4 3.5 - 5.1 mmol/L   Chloride 108 101 - 111 mmol/L   BUN 23 (H) 6 - 20 mg/dL   Creatinine, Ser 1.40 (H) 0.44 - 1.00 mg/dL   Glucose, Bld 128 (H) 65 - 99 mg/dL   Calcium, Ion 1.19 1.15 - 1.40 mmol/L   TCO2 27 22 - 32 mmol/L   Hemoglobin 10.5 (L) 12.0 - 15.0 g/dL   HCT 31.0 (L) 36.0 - 46.0 %  Urinalysis, Routine w reflex microscopic     Status: Abnormal   Collection Time: 04/10/17  3:06 PM  Result Value Ref Range   Color, Urine STRAW (A) YELLOW   APPearance CLEAR CLEAR   Specific  Gravity, Urine 1.006 1.005 - 1.030   pH 7.0 5.0 - 8.0   Glucose, UA NEGATIVE NEGATIVE mg/dL   Hgb urine dipstick NEGATIVE NEGATIVE   Bilirubin Urine NEGATIVE NEGATIVE   Ketones, ur NEGATIVE NEGATIVE mg/dL   Protein, ur NEGATIVE NEGATIVE mg/dL   Nitrite NEGATIVE NEGATIVE   Leukocytes, UA NEGATIVE NEGATIVE  CBC     Status: Abnormal   Collection Time: 04/10/17  3:40 PM  Result Value Ref Range   WBC 6.7 4.0 - 10.5 K/uL  RBC 4.73 3.87 - 5.11 MIL/uL   Hemoglobin 10.1 (L) 12.0 - 15.0 g/dL   HCT 35.0 (L) 36.0 - 46.0 %   MCV 74.0 (L) 78.0 - 100.0 fL   MCH 21.4 (L) 26.0 - 34.0 pg   MCHC 28.9 (L) 30.0 - 36.0 g/dL   RDW 13.7 11.5 - 15.5 %   Platelets 271 150 - 400 K/uL  Basic metabolic panel     Status: None   Collection Time: 04/10/17  3:40 PM  Result Value Ref Range   Sodium 141 135 - 145 mmol/L   Potassium 3.5 3.5 - 5.1 mmol/L   Chloride 105 101 - 111 mmol/L   CO2 25 22 - 32 mmol/L   Glucose, Bld 74 65 - 99 mg/dL   BUN 15 6 - 20 mg/dL   Creatinine, Ser 0.81 0.44 - 1.00 mg/dL   Calcium 9.8 8.9 - 10.3 mg/dL   GFR calc non Af Amer >60 >60 mL/min   GFR calc Af Amer >60 >60 mL/min    Comment: (NOTE) The eGFR has been calculated using the CKD EPI equation. This calculation has not been validated in all clinical situations. eGFR's persistently <60 mL/min signify possible Chronic Kidney Disease.    Anion gap 11 5 - 15  Urinalysis, Routine w reflex microscopic     Status: None   Collection Time: 05/19/17  9:53 AM  Result Value Ref Range   Color, Urine YELLOW YELLOW   APPearance CLEAR CLEAR   Specific Gravity, Urine 1.016 1.005 - 1.030   pH 5.0 5.0 - 8.0   Glucose, UA NEGATIVE NEGATIVE mg/dL   Hgb urine dipstick NEGATIVE NEGATIVE   Bilirubin Urine NEGATIVE NEGATIVE   Ketones, ur NEGATIVE NEGATIVE mg/dL   Protein, ur NEGATIVE NEGATIVE mg/dL   Nitrite NEGATIVE NEGATIVE   Leukocytes, UA NEGATIVE NEGATIVE    Comment: Performed at [facility], [address], [place], [place]  [address]  Basic metabolic panel     Status: Abnormal   Collection Time: 05/19/17 10:23 AM  Result Value Ref Range   Sodium 139 135 - 145 mmol/L   Potassium 4.0 3.5 - 5.1 mmol/L   Chloride 108 101 - 111 mmol/L   CO2 23 22 - 32 mmol/L   Glucose, Bld 99 65 - 99 mg/dL   BUN 27 (H) 6 - 20 mg/dL   Creatinine, Ser 1.11 (H) 0.44 - 1.00 mg/dL   Calcium 8.9 8.9 - 10.3 mg/dL   GFR calc non Af Amer 51 (L) >60 mL/min   GFR calc Af Amer 59 (L) >60 mL/min    Comment: (NOTE) The eGFR has been calculated using the CKD EPI equation. This calculation has not been validated in all clinical situations. eGFR's persistently <60 mL/min signify possible Chronic Kidney Disease.    Anion gap 8 5 - 15    Comment: Performed at [facility], [address] [place], [place] [address]  CBC     Status: Abnormal   Collection Time: 05/19/17 10:23 AM  Result Value Ref Range   WBC 4.7 4.0 - 10.5 K/uL   RBC 4.11 3.87 - 5.11 MIL/uL   Hemoglobin 8.6 (L) 12.0 - 15.0 g/dL   HCT 29.8 (L) 36.0 - 46.0 %   MCV 72.5 (L) 78.0 - 100.0 fL   MCH 20.9 (L) 26.0 - 34.0 pg   MCHC 28.9 (L) 30.0 - 36.0 g/dL   RDW 14.8 11.5 - 15.5 %   Platelets 214 150 - 400 K/uL  Comment: Performed at [facility], [address], [place], [place] [address]  Troponin I     Status: None   Collection Time: 05/19/17 10:23 AM  Result Value Ref Range   Troponin I <0.03 <0.03 ng/mL    Comment: Performed at [facility], [address] [place], [place] [address]  Brain natriuretic peptide     Status: None   Collection Time: 05/19/17 10:23 AM  Result Value Ref Range   B Natriuretic Peptide 55.0 0.0 - 100.0 pg/mL    Comment: Performed at [facility], [address], [place], [place] [address]  Ammonia     Status: None   Collection Time: 05/19/17 10:23 AM  Result Value Ref Range   Ammonia 17 9 - 35 umol/L    Comment: Performed at [facility], [address], [place], [place] [address]  Hepatic function panel     Status: Abnormal   Collection Time:  05/19/17 10:23 AM  Result Value Ref Range   Total Protein 6.3 (L) 6.5 - 8.1 g/dL   Albumin 3.4 (L) 3.5 - 5.0 g/dL   AST 28 15 - 41 U/L   ALT 23 14 - 54 U/L   Alkaline Phosphatase 88 38 - 126 U/L   Total Bilirubin 0.8 0.3 - 1.2 mg/dL   Bilirubin, Direct <0.1 (L) 0.1 - 0.5 mg/dL   Indirect Bilirubin NOT CALCULATED 0.3 - 0.9 mg/dL    Comment: Performed at [facility], [address] [place], [place] [address]  POC CBG, ED     Status: Abnormal   Collection Time: 05/19/17 10:45 AM  Result Value Ref Range   Glucose-Capillary 104 (H) 65 - 99 mg/dL  POCT glycosylated hemoglobin (Hb A1C)     Status: None   Collection Time: 06/07/17  3:01 PM  Result Value Ref Range   Hemoglobin A1C 5.8   Urinalysis, Routine w reflex microscopic     Status: None   Collection Time: 06/10/17  4:30 PM  Result Value Ref Range   Color, Urine YELLOW YELLOW   APPearance CLEAR CLEAR   Specific Gravity, Urine 1.011 1.005 - 1.030   pH 6.0 5.0 - 8.0   Glucose, UA NEGATIVE NEGATIVE mg/dL   Hgb urine dipstick NEGATIVE NEGATIVE   Bilirubin Urine NEGATIVE NEGATIVE   Ketones, ur NEGATIVE NEGATIVE mg/dL   Protein, ur NEGATIVE NEGATIVE mg/dL   Nitrite NEGATIVE NEGATIVE   Leukocytes, UA NEGATIVE NEGATIVE    Comment: Performed at [facility], [address] [place], [place] [address]    RADIOGRAPHIC STUDIES: I have personally reviewed the radiological images as listed and agreed with the findings in the report. Dg Ribs Unilateral W/chest Right  Result Date: 05/31/2017 CLINICAL DATA:  Right posterior lower rib pain following a fall this morning. Ex-smoker. EXAM: RIGHT RIBS AND CHEST - 3+ VIEW COMPARISON:  05/19/2017. FINDINGS: Normal sized heart. Clear lungs. No fracture or pneumothorax seen. Thoracic spine degenerative changes and mild scoliosis. Cholecystectomy clips. IMPRESSION: No fracture or other acute abnormality. Electronically Signed   By: Claudie Revering M.D.   On: 05/31/2017 11:45   Ct Renal Stone Study  Result  Date: 05/31/2017 CLINICAL DATA:  Right flank pain.  Fall this morning. EXAM: CT ABDOMEN AND PELVIS WITHOUT CONTRAST TECHNIQUE: Multidetector CT imaging of the abdomen and pelvis was performed following the standard protocol without IV contrast. COMPARISON:  CT abdomen pelvis dated December 16, 2016. FINDINGS: Lower chest: Scarring in the lingula is slightly more nodular in appearance when compared to prior study,  but overall similar in size. No acute abnormality. Coronary artery disease. Hepatobiliary: No focal liver abnormality is seen. No gallstones, gallbladder wall thickening, or biliary dilatation. Pancreas: Unremarkable. No pancreatic ductal dilatation or surrounding inflammatory changes. Spleen: Normal in size without focal abnormality. Adrenals/Urinary Tract: Adrenal glands are unremarkable. Kidneys are normal, without renal calculi, focal lesion, or hydronephrosis. Bladder is unremarkable. Stomach/Bowel: Stomach is within normal limits. Appendix appears normal. No evidence of bowel wall thickening, distention, or inflammatory changes. Mild colonic diverticulosis. Vascular/Lymphatic: Aortic atherosclerosis. No enlarged abdominal or pelvic lymph nodes. Reproductive: Status post hysterectomy. No adnexal masses. Other: No abdominal wall hernia or abnormality. No abdominopelvic ascites. No pneumoperitoneum. Musculoskeletal: Moderate right flank subcutaneous soft tissue stranding and small hematoma. No acute or significant osseous findings. Old healed left lateral seventh rib fracture. Moderate to severe degenerative disc disease at L4-L5 and L5-S1 status post prior posterior decompression, unchanged. IMPRESSION: 1. Moderate right flank subcutaneous soft tissue stranding with small hematoma, possibly related to recent trauma given fall earlier today. 2. No acute osseous abnormality. 3. No renal or ureteral calculi. 4. Scarring in the lingula slightly more nodular in appearance when compared to prior study, but  overall similar in size, measuring 7 mm. Recommend follow-up chest CT in 6-12 months to evaluate for interval change. Electronically Signed   By: Titus Dubin M.D.   On: 05/31/2017 17:07    ASSESSMENT & PLAN:  Microcytic anemia 1.  Microcytic anemia: - It is a combination anemia from iron deficiency and chronic kidney disease. - Colonoscopy on 02/10/2016 showing 2 sessile polyps in the descending colon, diverticuli in sigmoid colon, EGD on 02/10/2016 showing mild to moderate esophagitis, erythema in the inferior gastric antrum -Her last hemoglobin on 05/19/2017 was 8.6 with an MCV of 72.  She was put on iron pills a few months ago, but she could not tolerate because of constipation.  She was treated with intravenous iron in the past.  Computer records indicate that she had nausea and vomiting after last infusion in 2014.  I will add Compazine as a premedication to parenteral iron.  We talked about repeating blood work today which include CBC, ferritin, iron panel, K02, folic acid, SPEP, LDH, reticulocyte count.  We will schedule her for parenteral iron therapy in the form of weekly Feraheme for 2 infusions.  We talked about the side effects including but not limited to allergic reactions which can rarely be serious.  We will watch her for 30 minutes after each infusion.  She is agreeable to this plan.  We will schedule her starting next week.  I will see her back in 4-5 weeks and repeat blood counts to see response.     All questions were answered. The patient knows to call the clinic with any problems, questions or concerns.      Derek Jack, MD 06/26/17 12:19 PM

## 2017-06-26 NOTE — Assessment & Plan Note (Addendum)
1.  Microcytic anemia: - It is a combination anemia from iron deficiency and chronic kidney disease. - Colonoscopy on 02/10/2016 showing 2 sessile polyps in the descending colon, diverticuli in sigmoid colon, EGD on 02/10/2016 showing mild to moderate esophagitis, erythema in the inferior gastric antrum -Her last hemoglobin on 05/19/2017 was 8.6 with an MCV of 72.  She was put on iron pills a few months ago, but she could not tolerate because of constipation.  She was treated with intravenous iron in the past.  Computer records indicate that she had nausea and vomiting after last infusion in 2014.  I will add Compazine as a premedication to parenteral iron.  We talked about repeating blood work today which include CBC, ferritin, iron panel, O16, folic acid, SPEP, LDH, reticulocyte count.  We will schedule her for parenteral iron therapy in the form of weekly Feraheme for 2 infusions.  We talked about the side effects including but not limited to allergic reactions which can rarely be serious.  We will watch her for 30 minutes after each infusion.  She is agreeable to this plan.  We will schedule her starting next week.  I will see her back in 4-5 weeks and repeat blood counts to see response.

## 2017-06-26 NOTE — Patient Instructions (Signed)
[facility] at [facility]  Discharge Instructions:  You were seen by Dr. Katragadda today.   _______________________________________________________________  Thank you for choosing [facility] at [facility] to provide your oncology and hematology care.  To afford each patient quality time with our providers, please arrive at least 15 minutes before your scheduled appointment.  You need to re-schedule your appointment if you arrive 10 or more minutes late.  We strive to give you quality time with our providers, and arriving late affects you and other patients whose appointments are after yours.  Also, if you no show three or more times for appointments you may be dismissed from the clinic.  Again, thank you for choosing [facility] at [facility]. Our hope is that these requests will allow you access to exceptional care and in a timely manner. _______________________________________________________________  If you have questions after your visit, please contact our office at [phone] between the hours of 8:30 a.m. and 5:00 p.m. Voicemails left after 4:30 p.m. will not be returned until the following business day. _______________________________________________________________  For prescription refill requests, have your pharmacy contact our office. _______________________________________________________________  Recommendations made by the consultant and any test results will be sent to your referring physician. _______________________________________________________________ 

## 2017-06-27 ENCOUNTER — Encounter (HOSPITAL_COMMUNITY): Payer: Self-pay | Admitting: *Deleted

## 2017-06-27 ENCOUNTER — Emergency Department (HOSPITAL_COMMUNITY)
Admission: EM | Admit: 2017-06-27 | Discharge: 2017-06-28 | Disposition: A | Discharge status: Home / Self Care | Payer: Medicare HMO | Attending: Emergency Medicine | Admitting: Emergency Medicine

## 2017-06-27 ENCOUNTER — Encounter (HOSPITAL_COMMUNITY): Payer: Self-pay | Admitting: Psychiatry

## 2017-06-27 ENCOUNTER — Ambulatory Visit (INDEPENDENT_AMBULATORY_CARE_PROVIDER_SITE_OTHER): Payer: Medicare HMO | Admitting: Psychiatry

## 2017-06-27 ENCOUNTER — Other Ambulatory Visit: Payer: Self-pay

## 2017-06-27 ENCOUNTER — Emergency Department (HOSPITAL_COMMUNITY): Payer: Medicare HMO

## 2017-06-27 DIAGNOSIS — J449 Chronic obstructive pulmonary disease, unspecified: Secondary | ICD-10-CM | POA: Diagnosis not present

## 2017-06-27 DIAGNOSIS — E1142 Type 2 diabetes mellitus with diabetic polyneuropathy: Secondary | ICD-10-CM | POA: Diagnosis not present

## 2017-06-27 DIAGNOSIS — R296 Repeated falls: Secondary | ICD-10-CM | POA: Diagnosis not present

## 2017-06-27 DIAGNOSIS — E119 Type 2 diabetes mellitus without complications: Secondary | ICD-10-CM | POA: Diagnosis not present

## 2017-06-27 DIAGNOSIS — R1084 Generalized abdominal pain: Secondary | ICD-10-CM | POA: Insufficient documentation

## 2017-06-27 DIAGNOSIS — I10 Essential (primary) hypertension: Secondary | ICD-10-CM | POA: Diagnosis not present

## 2017-06-27 DIAGNOSIS — Z79899 Other long term (current) drug therapy: Secondary | ICD-10-CM | POA: Insufficient documentation

## 2017-06-27 DIAGNOSIS — R109 Unspecified abdominal pain: Secondary | ICD-10-CM | POA: Diagnosis not present

## 2017-06-27 DIAGNOSIS — Z7982 Long term (current) use of aspirin: Secondary | ICD-10-CM | POA: Diagnosis not present

## 2017-06-27 DIAGNOSIS — F250 Schizoaffective disorder, bipolar type: Secondary | ICD-10-CM | POA: Diagnosis not present

## 2017-06-27 DIAGNOSIS — I69354 Hemiplegia and hemiparesis following cerebral infarction affecting left non-dominant side: Secondary | ICD-10-CM | POA: Diagnosis not present

## 2017-06-27 DIAGNOSIS — F331 Major depressive disorder, recurrent, moderate: Secondary | ICD-10-CM | POA: Diagnosis not present

## 2017-06-27 DIAGNOSIS — Z794 Long term (current) use of insulin: Secondary | ICD-10-CM | POA: Insufficient documentation

## 2017-06-27 DIAGNOSIS — Z87891 Personal history of nicotine dependence: Secondary | ICD-10-CM | POA: Diagnosis not present

## 2017-06-27 DIAGNOSIS — I110 Hypertensive heart disease with heart failure: Secondary | ICD-10-CM | POA: Diagnosis not present

## 2017-06-27 DIAGNOSIS — R1033 Periumbilical pain: Secondary | ICD-10-CM | POA: Diagnosis present

## 2017-06-27 DIAGNOSIS — K5900 Constipation, unspecified: Secondary | ICD-10-CM | POA: Insufficient documentation

## 2017-06-27 DIAGNOSIS — I509 Heart failure, unspecified: Secondary | ICD-10-CM | POA: Diagnosis not present

## 2017-06-27 LAB — CBC WITH DIFFERENTIAL/PLATELET
Basophils Absolute: 0 10*3/uL (ref 0.0–0.1)
Basophils Relative: 0 %
Eosinophils Absolute: 0.2 10*3/uL (ref 0.0–0.7)
Eosinophils Relative: 2 %
HEMATOCRIT: 33.3 % — AB (ref 36.0–46.0)
HEMOGLOBIN: 9.8 g/dL — AB (ref 12.0–15.0)
LYMPHS ABS: 2.8 10*3/uL (ref 0.7–4.0)
LYMPHS PCT: 34 %
MCH: 21 pg — AB (ref 26.0–34.0)
MCHC: 29.4 g/dL — ABNORMAL LOW (ref 30.0–36.0)
MCV: 71.3 fL — AB (ref 78.0–100.0)
Monocytes Absolute: 0.6 10*3/uL (ref 0.1–1.0)
Monocytes Relative: 8 %
NEUTROS PCT: 56 %
Neutro Abs: 4.5 10*3/uL (ref 1.7–7.7)
Platelets: 260 10*3/uL (ref 150–400)
RBC: 4.67 MIL/uL (ref 3.87–5.11)
RDW: 17 % — ABNORMAL HIGH (ref 11.5–15.5)
WBC: 8.1 10*3/uL (ref 4.0–10.5)

## 2017-06-27 LAB — COMPREHENSIVE METABOLIC PANEL
ALK PHOS: 124 U/L (ref 38–126)
ALT: 32 U/L (ref 14–54)
AST: 36 U/L (ref 15–41)
Albumin: 3.9 g/dL (ref 3.5–5.0)
Anion gap: 10 (ref 5–15)
BUN: 24 mg/dL — ABNORMAL HIGH (ref 6–20)
CALCIUM: 9.5 mg/dL (ref 8.9–10.3)
CO2: 23 mmol/L (ref 22–32)
CREATININE: 1.09 mg/dL — AB (ref 0.44–1.00)
Chloride: 107 mmol/L (ref 101–111)
GFR, EST AFRICAN AMERICAN: 59 mL/min — AB (ref >60)
GFR, EST NON AFRICAN AMERICAN: 51 mL/min — AB (ref >60)
Glucose, Bld: 89 mg/dL (ref 65–99)
Potassium: 3.9 mmol/L (ref 3.5–5.1)
Sodium: 140 mmol/L (ref 135–145)
Total Bilirubin: 0.4 mg/dL (ref 0.3–1.2)
Total Protein: 7.7 g/dL (ref 6.5–8.1)

## 2017-06-27 LAB — URINALYSIS, ROUTINE W REFLEX MICROSCOPIC
BILIRUBIN URINE: NEGATIVE
Glucose, UA: NEGATIVE mg/dL
Hgb urine dipstick: NEGATIVE
Ketones, ur: NEGATIVE mg/dL
Leukocytes, UA: NEGATIVE
NITRITE: NEGATIVE
PH: 5 (ref 5.0–8.0)
Protein, ur: NEGATIVE mg/dL
Specific Gravity, Urine: 1.011 (ref 1.005–1.030)

## 2017-06-27 LAB — PROTEIN ELECTROPHORESIS, SERUM
A/G Ratio: 1.1 (ref 0.7–1.7)
ALBUMIN ELP: 3.5 g/dL (ref 2.9–4.4)
Alpha-1-Globulin: 0.2 g/dL (ref 0.0–0.4)
Alpha-2-Globulin: 0.9 g/dL (ref 0.4–1.0)
Beta Globulin: 1.2 g/dL (ref 0.7–1.3)
GAMMA GLOBULIN: 1 g/dL (ref 0.4–1.8)
Globulin, Total: 3.3 g/dL (ref 2.2–3.9)
TOTAL PROTEIN ELP: 6.8 g/dL (ref 6.0–8.5)

## 2017-06-27 LAB — ERYTHROPOIETIN: Erythropoietin: 12.3 m[IU]/mL (ref 2.6–18.5)

## 2017-06-27 LAB — HAPTOGLOBIN: Haptoglobin: 167 mg/dL (ref 34–200)

## 2017-06-27 NOTE — ED Provider Notes (Addendum)
[facility] EMERGENCY DEPARTMENT Provider Note   CSN: [id number] Arrival date & time: 06/27/17  1930     History   Chief Complaint Chief Complaint  Patient presents with  . Constipation    HPI [name] is a 67 y.o. female with a past medical hx of diabetes, COPD, uncontrolled hypertension, history of CVA with left hemiplegia presenting with abdominal pain, distention, decreased appetite and constipation.  She states she has not had a bowel movement in 5 days and prior to this was just passing "marbles".  She has periumbilical to lower abdominal pain which she reports radiates into her rectum.  She reports she feels like something is "stuck".  She also endorses having a history of a prior bowel obstruction.  She denies history of frequent constipation and has had no narcotic pain medications recently but does take an occasional hydrocodone for chronic back pain.  She has tried stool softeners without relief of symptoms. She denies chest pain, sob, headaches, n/v, bloody stools , dysuria and fever.   The history is provided by the patient.    Past Medical History:  Diagnosis Date  . Anemia   . Anxiety    takes Ativan daily  . Arthritis   . Bipolar disorder ([place])    takes Risperdal nightly  . Blood transfusion   . Cancer ([place])    In her gum  . Carpal tunnel syndrome of right wrist 05/23/2011  . Cervical disc disorder with radiculopathy of cervical region 10/31/2012  . Chronic back pain   . Chronic idiopathic constipation   . Colon polyps   . COPD (chronic obstructive pulmonary disease) with chronic bronchitis ([place]) 09/16/2013   Office Spirometry 10/30/2013-submaximal effort based on appearance of loop and curve. Numbers would fit with severe restriction but her physiologic capability may be better than this. FVC 0.91/44%, and 10.74/45%, FEV1/FVC 0.81, FEF 25-75% 1.43/69%    . Depression    takes Zoloft daily  . Diabetes mellitus    Type II  . Diverticulosis    TCS 9/08 by  Dr. Delfin Edis for diarrhea . Bx for micro scopic colitis negative.   . Fibromyalgia   . Frequent falls   . GERD (gastroesophageal reflux disease)    takes Aciphex daily  . Glaucoma    eye drops daily  . Gum symptoms    infection on antibiotic  . Hemiplegia affecting non-dominant side, post-stroke 08/02/2011  . Hyperlipidemia    takes Crestor daily  . Hypertension    takes Amlodipine,Metoprolol,and Clonidine daily  . Hypothyroidism    takes Synthroid daily  . IBS (irritable bowel syndrome)   . Insomnia    takes Trazodone nightly  . 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 Metabolic encephalopathy 0/27/2536  . Migraines    chronic headaches  . Mononeuritis lower limb   . Narcolepsy   . Osteoporosis   . Pancreatitis 2006   due to Depakote with normal EUS   . Schatzki's ring    non critical / EGD with ED 8/2011with RMR  . Seizures ([place])    takes Lamictal daily.Last seizure 3 yrs ago  . Sleep apnea    on CPAP  . Stroke [facility])    left sided weakness  . Tubular adenoma of colon     Patient Active Problem List   Diagnosis Date Noted  . At risk for cardiovascular event 06/10/2017  . 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 Rash and nonspecific skin eruption 04/25/2017  . Finger pain, right 01/28/2017  .  Hypotension   . Diabetic polyneuropathy associated with type 2 diabetes mellitus ([place]) 05/19/2016  . Smoker 04/24/2016  . History of palpitations 08/09/2015  . Nausea without vomiting 08/09/2015  . Labile hypertension 08/03/2015  . Normal coronary arteries 08/03/2015  . Left-sided low back pain with left-sided sciatica 06/27/2015  . Left flank pain 06/27/2015  . Type 2 diabetes mellitus with hyperglycemia, with long-term current use of insulin ([place]) 05/06/2015  . Multinodular goiter 05/06/2015  . Rectocele, female 04/27/2015  . Anal sphincter incontinence 04/27/2015  . Pelvic relaxation due to rectocele 03/30/2015  . Pulmonary hypertension ([place]) 02/22/2015  . Light cigarette  smoker (1-9 cigarettes per day) 01/11/2015  . Migraine without aura and without status migrainosus, not intractable 07/02/2014  . Flatulence 02/18/2014  . Microcytic anemia 02/18/2014  . COPD (chronic obstructive pulmonary disease) with chronic bronchitis ([place]) 09/16/2013  . Acute head trauma 08/16/2013  . Hypothyroidism 08/16/2013  . Gastroparesis 04/28/2013  . Seizure disorder ([place]) 01/19/2013  . Cervical disc disorder with radiculopathy of cervical region 10/31/2012  . Solitary pulmonary nodule 08/19/2012  . Anemia 07/05/2012  . 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 Allergic sinusitis 04/18/2012  . Meningioma ([place]) 11/19/2011  . 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 Carpal tunnel syndrome of right wrist 05/23/2011  . Polypharmacy 04/28/2011  . Bipolar disorder ([place]) 04/28/2011  . Constipation 04/13/2011  . Falls frequently 12/12/2010  . Oropharyngeal dysphagia 07/12/2010  . Urinary incontinence 12/16/2009  . HEARING LOSS 10/26/2009  . Hyperlipidemia 12/11/2008  . IBS 12/11/2008  . GERD 07/29/2008  . MILK PRODUCTS ALLERGY 07/29/2008  . 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 Backache 06/19/2007  . Osteoporosis 06/19/2007  . Obstructive sleep apnea 06/19/2007  . TRIGGER FINGER 04/18/2007  . DIVERTICULOSIS, COLON 11/13/2006    Past Surgical History:  Procedure Laterality Date  . ABDOMINAL HYSTERECTOMY  1978  . BACK SURGERY  July 2012  . BACTERIAL OVERGROWTH TEST N/A 05/05/2013   Procedure: BACTERIAL OVERGROWTH TEST;  Surgeon: Daneil Dolin, MD;  [facility];  Service: Endoscopy;  Laterality: N/A;  7:30  . BIOPSY THYROID  2009  . BRAIN SURGERY  11/2011   resection of meningioma  . BREAST REDUCTION SURGERY  1994  . CARDIAC CATHETERIZATION  05/10/2005   normal coronaries, normal LV systolic function and EF (Dr. Jackie Plum)  . CARPAL TUNNEL RELEASE Left 07/22/04   Dr. Aline Brochure  . CATARACT EXTRACTION  Bilateral   . CHOLECYSTECTOMY  1984  . COLONOSCOPY N/A 09/25/2012   OBS:JGGEZMO diverticulosis.  colonic polyp-removed : tubular adenoma  . CRANIOTOMY  11/23/2011   Procedure: CRANIOTOMY TUMOR EXCISION;  Surgeon: Hosie Spangle, MD;  Location: [place] [facility]S;  Service: Neurosurgery;  Laterality: N/A;  Craniotomy for tumor resection  . ESOPHAGOGASTRODUODENOSCOPY  12/29/2010   Rourk-Retained food in the esophagus and stomach, small hiatal hernia, status post Maloney dilation of the esophagus  . ESOPHAGOGASTRODUODENOSCOPY N/A 09/25/2012   QHU:[id number] atonic baggy esophagus status post Maloney dilation 21 F. Hiatal hernia  . GIVENS CAPSULE STUDY N/A 01/15/2013   NORMAL.   . IR GENERIC HISTORICAL  03/17/2016   IR RADIOLOGIST EVAL & MGMT 03/17/2016 [facility]-INTERV RAD  . LESION REMOVAL N/A 05/31/2015   Procedure: REMOVAL RIGHT AND LEFT LESIONS OF MANDIBLE;  Surgeon: Diona Browner, DDS;  Location: [place];  Service: Oral Surgery;  Laterality: N/A;  . MALONEY DILATION  12/29/2010   RMR;  . NM MYOCAR PERF WALL MOTION  2006   "  relavtiely normal" persantine, mild anterior thinning (breast attenuation artifact), no region of scar/ischemia  . OVARIAN CYST REMOVAL    . RECTOCELE REPAIR N/A 06/29/2015   Procedure: POSTERIOR REPAIR (RECTOCELE);  Surgeon: Jonnie Kind, MD;  Location: [facility];  Service: Gynecology;  Laterality: N/A;  . REDUCTION MAMMAPLASTY Bilateral   . SPINE SURGERY  09/29/2010   Dr. Rolena Infante  . surgical excision of 3 tumors from right thigh and right buttock  and left upper thigh  2010  . TOOTH EXTRACTION Bilateral 12/14/2014   Procedure: REMOVAL OF BILATERAL MANDIBULAR EXOSTOSES;  Surgeon: Diona Browner, DDS;  Location: [place];  Service: Oral Surgery;  Laterality: Bilateral;  . TRANSTHORACIC ECHOCARDIOGRAM  2010   EF 60-65%, mild conc LVH, grade 1 diastolic dysfunction; mildly calcified MV annulus with mildly thickened leaflets, mildly calcified MR annulus     OB History    Gravida  6   Para  1     Term  1   Preterm      AB  5   Living        SAB  5   TAB      Ectopic      Multiple      Live Births               Home Medications    Prior to Admission medications   Medication Sig Start Date End Date Taking? Authorizing Provider  ACCU-CHEK SMARTVIEW test strip TEST BLOOD SUGAR FOUR TIMES DAILY AS DIRECTED 05/08/17  Yes Philemon Kingdom, MD  albuterol (PROVENTIL HFA;VENTOLIN HFA) 108 (90 Base) MCG/ACT inhaler Inhale 1-2 puffs every 6 hours as needed for wheezing, shortness of breath 07/27/16  Yes Young, Tarri Fuller D, MD  ascorbic acid (VITAMIN C) 500 MG tablet Take 500 mg by mouth daily.   Yes [provider]  aspirin EC 81 MG tablet Take 81 mg by mouth daily.   Yes [provider]  cetirizine (ZYRTEC) 10 MG tablet Take 1 tablet (10 mg total) by mouth daily. 06/09/17  Yes [name], MD  Cholecalciferol (VITAMIN D3) 5000 units CAPS Take 1 capsule by mouth daily.   Yes [provider]  cloNIDine (CATAPRES) 0.3 MG tablet TAKE 1 TABLET BY MOUTH EVERY EIGHT HOURS. ONCE AT 8AM, 4PM, AND 12 MIDNIGHT. 05/23/17  Yes [name], MD  HYDROcodone-acetaminophen (NORCO/VICODIN) 5-325 MG tablet Take 1 pill every 4-6 hours for pain not relieved by your normal pain medicine 05/31/17  Yes Milton Ferguson, MD  hydroxychloroquine (PLAQUENIL) 200 MG tablet Take 200 mg by mouth daily.    Yes [provider]  insulin aspart (NOVOLOG FLEXPEN) 100 UNIT/ML FlexPen INJECT 10 TO 14 UNITS INTO THE SKIN 3 (THREE) TIMES DAILY WITH MEALS. 06/07/17  Yes Philemon Kingdom, MD  Insulin Glargine (TOUJEO SOLOSTAR) 300 UNIT/ML SOPN Inject 20 Units into the skin at bedtime. 06/07/17  Yes Philemon Kingdom, MD  lamoTRIgine (LAMICTAL) 100 MG tablet TAKE 1 TABLET BY MOUTH TWICE DAILY. 04/02/17  Yes Pieter Partridge, DO  levothyroxine (SYNTHROID, LEVOTHROID) 50 MCG tablet One tab once daily and one half tab on sunday Patient taking differently: Take 50 mcg by mouth daily  before breakfast. One tab once daily and one half tab on sunday 05/23/17  Yes [name], MD  LORazepam (ATIVAN) 0.5 MG tablet Take 1 tablet (0.5 mg total) by mouth 3 (three) times daily. 04/30/17 04/30/18 Yes Cloria Spring, MD  losartan (COZAAR) 50 MG tablet Take 1 tablet (50  mg total) by mouth daily. 05/23/17  Yes [name], MD  metoprolol tartrate (LOPRESSOR) 50 MG tablet Take 1 tablet (50 mg total) by mouth 2 (two) times daily. Need office visit 06/01/17  Yes Hilty, Nadean Corwin, MD  montelukast (SINGULAIR) 10 MG tablet Take 1 tablet (10 mg total) by mouth daily. 05/23/17  Yes [name], MD  Multiple Vitamins-Minerals (HM MULTIVITAMIN ADULT GUMMY PO) Take 1 tablet by mouth daily.   Yes [provider]  pregabalin (LYRICA) 75 MG capsule Take 1 capsule (75 mg total) by mouth daily. 10/20/16  Yes Rexene Alberts, MD  RABEprazole (ACIPHEX) 20 MG tablet TAKE 1 TABLET BY MOUTH TWICE DAILY. 06/01/17  Yes Pyrtle, Lajuan Lines, MD  RESTASIS 0.05 % ophthalmic emulsion Place 1 drop into both eyes 2 (two) times daily.  02/04/14  Yes [provider]  risperiDONE (RISPERDAL) 0.5 MG tablet Take 1 tablet (0.5 mg total) by mouth at bedtime. 04/30/17  Yes Cloria Spring, MD  rosuvastatin (CRESTOR) 5 MG tablet Take 1 tablet (5 mg total) by mouth at bedtime. 05/23/17  Yes [name], MD  sertraline (ZOLOFT) 100 MG tablet Take 1 tablet (100 mg total) by mouth daily. 04/30/17  Yes Cloria Spring, MD  traMADol (ULTRAM) 50 MG tablet Take 1 tablet (50 mg total) by mouth every 6 (six) hours as needed. Patient taking differently: Take 50 mg by mouth 3 (three) times daily as needed for moderate pain.  04/03/17  Yes Julianne Rice, MD  traZODone (DESYREL) 100 MG tablet Take 1 tablet (100 mg total) by mouth at bedtime. Patient taking differently: Take 200 mg by mouth at bedtime.  04/30/17  Yes Cloria Spring, MD  Insulin Pen Needle (CAREFINE PEN NEEDLES) 32G X 4 MM MISC Use 4x a day  06/07/17   Philemon Kingdom, MD  polyethylene glycol [facility]) packet Take 17 g by mouth daily. 06/28/17   Evalee Jefferson, PA-C  predniSONE (DELTASONE) 5 MG tablet One tablet twice daily for 5 days Patient not taking: Reported on 06/27/2017 06/09/17   [name], MD    Family History Family History  Problem Relation Age of Onset  . Heart attack Mother        HTN  . Pneumonia Father   . Kidney failure Father   . Diabetes Father   . Pancreatic cancer Sister   . Diabetes Brother   . Hypertension Brother   . Diabetes Brother   . Cancer Sister        breast   . Hypertension Son   . Sleep apnea Son   . Cancer Sister        pancreatic  . Stroke Maternal Grandmother   . Heart attack Maternal Grandfather   . Alcohol abuse Maternal Uncle   . Colon cancer Neg Hx   . Anesthesia problems Neg Hx   . Hypotension Neg Hx   . Malignant hyperthermia Neg Hx   . Pseudochol deficiency Neg Hx     Social History Social History   Tobacco Use  . Smoking status: Former Smoker    Packs/day: 0.25    Years: 7.00    Pack years: 1.75    Types: Cigarettes    Last attempt to quit: 03/13/2017    Years since quitting: 0.2  . Smokeless tobacco: Never Used  . Tobacco comment: continues to smoke - a little less than a 1/4 pack per da as of 05/01/2017  Substance Use Topics  . Alcohol use: No  Alcohol/week: 0.0 oz    Comment:    . Drug use: No     Allergies   Cephalexin; Iron; Milk-related compounds; Penicillins; and Phenazopyridine hcl   Review of Systems Review of Systems  Constitutional: Negative for chills and fever.  HENT: Negative for congestion and sore throat.   Eyes: Negative.   Respiratory: Negative for chest tightness and shortness of breath.   Cardiovascular: Negative for chest pain.  Gastrointestinal: Positive for abdominal distention, abdominal pain and constipation. Negative for nausea and vomiting.  Genitourinary: Negative.   Musculoskeletal: Negative for arthralgias,  joint swelling and neck pain.  Skin: Negative.  Negative for rash and wound.  Neurological: Negative for dizziness, weakness, light-headedness, numbness and headaches.  Psychiatric/Behavioral: Negative.      Physical Exam Updated Vital Signs BP (!) 201/76   Pulse 73   Temp 98.9 F (37.2 C) (Oral)   Resp 16   Ht 5\' 1"  (1.549 m)   Wt 69.4 kg (153 lb)   SpO2 98%   BMI 28.91 kg/m   Physical Exam  Constitutional: She appears well-developed and well-nourished.  Appears uncomfortable  HENT:  Head: Normocephalic and atraumatic.  Eyes: Conjunctivae are normal.  Neck: Normal range of motion.  Cardiovascular: Normal rate, regular rhythm, normal heart sounds and intact distal pulses.  Pulmonary/Chest: Effort normal and breath sounds normal. She has no wheezes.  Abdominal: Soft. She exhibits distension. She exhibits no mass. There is generalized tenderness. There is no rigidity and no guarding.  Genitourinary: Rectum normal. Rectal exam shows no mass.  Genitourinary Comments:  No impaction, no stool in rectum  Musculoskeletal: Normal range of motion.  Neurological: She is alert.  Skin: Skin is warm and dry.  Psychiatric: She has a normal mood and affect.  Nursing note and vitals reviewed.    ED Treatments / Results  Labs (all labs ordered are listed, but only abnormal results are displayed) Labs Reviewed  CBC WITH DIFFERENTIAL/PLATELET - Abnormal; Notable for the following components:      Result Value   Hemoglobin 9.8 (*)    HCT 33.3 (*)    MCV 71.3 (*)    MCH 21.0 (*)    MCHC 29.4 (*)    RDW 17.0 (*)    All other components within normal limits  COMPREHENSIVE METABOLIC PANEL - Abnormal; Notable for the following components:   BUN 24 (*)    Creatinine, Ser 1.09 (*)    GFR calc non Af Amer 51 (*)    GFR calc Af Amer 59 (*)    All other components within normal limits  URINALYSIS, ROUTINE W REFLEX MICROSCOPIC - Abnormal; Notable for the following components:   Color,  Urine STRAW (*)    All other components within normal limits    EKG None  Radiology Dg Abdomen 1 View  Result Date: 06/27/2017 CLINICAL DATA:  Constipation x 1 week. Hx of IBS, ovarian cyst removal Cholecystectomy, hysterectomy, colonoscopy. EXAM: ABDOMEN - 1 VIEW COMPARISON:  CT 05/31/2017 FINDINGS: No dilated to large or small bowel. Moderate volume stool in the ascending colon. Small volume stool in the rectosigmoid colon. No pathologic calcifications. IMPRESSION: No bowel obstruction.  Gas and stool throughout the colon. Electronically Signed   By: Suzy Bouchard M.D.   On: 06/27/2017 23:39   Ct Abdomen Pelvis W Contrast  Result Date: 06/28/2017 CLINICAL DATA:  Acute onset of lower abdominal pain. Patient has not had a bowel movement in 5 days. EXAM: CT ABDOMEN AND PELVIS WITH CONTRAST TECHNIQUE:  Multidetector CT imaging of the abdomen and pelvis was performed using the standard protocol following bolus administration of intravenous contrast. CONTRAST:  143mL ISOVUE-300 IOPAMIDOL (ISOVUE-300) INJECTION 61% COMPARISON:  CT of the abdomen and pelvis performed 05/31/2017 FINDINGS: Lower chest: Minimal left basilar atelectasis is noted. The visualized portions of the mediastinum are unremarkable. Hepatobiliary: The liver is unremarkable in appearance. The patient is status post cholecystectomy, with clips noted at the gallbladder fossa. The common bile duct remains normal in caliber. Pancreas: The pancreas is within normal limits. Spleen: The spleen is unremarkable in appearance. Adrenals/Urinary Tract: The adrenal glands are unremarkable in appearance. The kidneys are within normal limits. There is no evidence of hydronephrosis. No renal or ureteral stones are identified. No perinephric stranding is seen. Stomach/Bowel: The stomach is unremarkable in appearance. The small bowel is within normal limits. The appendix is normal in caliber, without evidence of appendicitis. Mild diverticulosis is noted  along the descending and sigmoid colon, without evidence of diverticulitis. A small amount of stool is noted in the colon. Vascular/Lymphatic: Scattered calcification is seen along the abdominal aorta and its branches. The abdominal aorta is otherwise grossly unremarkable. The inferior vena cava is grossly unremarkable. No retroperitoneal lymphadenopathy is seen. No pelvic sidewall lymphadenopathy is identified. Reproductive: The bladder is mildly distended and grossly unremarkable. The patient is status post hysterectomy. No suspicious adnexal masses are seen. Other: No additional soft tissue abnormalities are seen. Musculoskeletal: No acute osseous abnormalities are identified. The patient is status post decompression at the lower lumbar spine. Underlying vacuum phenomenon is noted. The visualized musculature is unremarkable in appearance. IMPRESSION: 1. No acute abnormality seen to explain the patient's symptoms. Small amount of stool noted in the colon, without CT evidence for constipation. 2. Mild diverticulosis along the descending and sigmoid colon, without evidence of diverticulitis. Aortic Atherosclerosis (ICD10-I70.0). Electronically Signed   By: Garald Balding M.D.   On: 06/28/2017 01:16    Procedures Procedures (including critical care time)  Medications Ordered in ED Medications  iopamidol (ISOVUE-300) 61 % injection 100 mL (100 mLs Intravenous Contrast Given 06/28/17 0031)  cloNIDine (CATAPRES) tablet 0.3 mg (0.3 mg Oral Given 06/28/17 0147)  metoprolol tartrate (LOPRESSOR) tablet 50 mg (50 mg Oral Given 06/28/17 0147)     Initial Impression / Assessment and Plan / ED Course  I have reviewed the triage vital signs and the nursing notes.  Pertinent labs & imaging results that were available during my care of the patient were reviewed by me and considered in my medical decision making (see chart for details).     Discussed lab results and imaging with pt. She felt improved after having  her CT scan.  Re-exam of abd with no ttp, no guarding, non acute abd.  No evidence for sbo, diverticulitis or other abd source if sx.  No significant constipation on CT imaging. Discussed elevated bp - pt states she missed her 4 pm and bedtime doses of clonidine and her bedtime dose of metoprolol tonight.  These meds were given her.  Will plan recheck of bp and if improved, will dc home. Pt reports has GI appt the end of this week, used to take miralax for constipation, asked for script here. This was provided. PRN f/u here. Return precautions discussed. Pt has no sx suggesting htn urgency, renal function normal.    Final Clinical Impressions(s) / ED Diagnoses   Final diagnoses:  Generalized abdominal pain  Essential hypertension    ED Discharge Orders  Ordered    polyethylene glycol (MIRALAX) packet  Daily     06/28/17 0156       Evalee Jefferson, PA-C 06/28/17 0150    Evalee Jefferson, PA-C 06/28/17 0158    Orpah Greek, MD 06/28/17 [phone]

## 2017-06-27 NOTE — ED Triage Notes (Signed)
Pt states she has not been able to have a BM in 5 days; pt states she has taken otc meds with no relief; pt c/o lower abdominal pain

## 2017-06-27 NOTE — Progress Notes (Signed)
Patient:  [name]               DOB: [birth year]          MR Number:  [id number]  Location:   [place]:  [address]              [place],  [place],    [place]          Start:  Wednesday 06/27/2017 1:10 PM End:       Wednesday 06/27/2017 2:00 PM  Provider/Observer:     Maurice Small, MSW, LCSW   Chief Complaint:                                                    Depression  Reason For Service:               [name] is a 67 y.o. female who presents with a long standing history of recurrent periods of depression beginning when she was 21 and her favorite uncle died. She h         as been experiencing increased symptoms of depression recently due to stress in the relationship with her son. Per patient's report, he rarely visits her anymore due to his involvement with an older woman who is controlling. She also reports frustration regarding her knees who is her power of attorn   ey and is very demanding perpatient's report. She reports issues with other family members and states she needs help dealing with her family Patient reports multiple psychiatric hospitlaizations due to depression and suicidal ideations with thel last one occuring in 1997. Patient has participated in outpatient psychotherapy and medication management intermittently since age 21.  She currently is seeing psychiatrist Dr. Harrington Challenger . Prior to this, she was being seen at [facility]. Patient also has had ECT at [facility].    Interventions Strategy:  Supportive  Participation Level:   Active      Participation Quality:  Appropriate      Behavioral Observation:  Casual, well groomed, speech very slow, drowsy   Current Psychosocial Factors: Tension in relationship with son, multiple health issues, concerns about grandson,   Content of Session:   reviewed symptoms, assisted patient identify stressors and  triggers of increased depressed mood, facilitated expression of thoughts and feelings, validated  feelings, assisted patient identify realistic expectations of son and grandson, discussed boundary issues and ways to respect their boundaries, discussed use of empathic skills in communication with grandson  Current Status:   Fluctuations in mood, worry, increased crying spells         Suicidal/Homicidal:    No   Patient Progress:   Patient last was seen about 2weeks ago. She reports continued stress regarding health and expresses frustration as well as anger regarding friends and family telling her how to take care of herself. She reports having an anger episode this past Saturday and says she threw lamps and books. She also reports not wanting to go out with son for her birthday dinner due to her anger as well as feeling down and depressed due to her health. She expresses sadness her grandson didn't attend her birthday dinner. She reports asking him to attend but says he didn't want to be around his father. She reports trying to talk with grandson about this but  says he hung up on her.   Target Goals:   1.  Learn and implement behavioral strategies to overcome depression.    2.  Verbalize an understanding and resolution of current interpersonal problems.   Last Reviewed:   10/13/2016  Goals Addressed Today:    1,2  Plan:      Return again in 2 - 3 weeks.  Impression/Diagnosis:   Patient presents with long standing history of recurrent periods  of depression beginning when she was [age] and her favorite uncle died. Patient reports multiple psychiatric hospitlaizations due to depression and suicidal ideations with thel last one occuring in 1997. Patient has participated in outpatient psychotherapy and medication management intermittently since age 73.  She currently is seeing psychiatrist Dr. Harrington Challenger . Prior to this, she was being seen at [facility]. Patient also has had ECT at [facility]. Symptoms have worsened in recent months due to family stress and have  included depressed mood, anxiety, excessive  worry, and tearfulness.   Diagnosis:  Axis I: MDD recurrent episode, moderate          Axis II: Deferred     [name] Huffine, LCSW 06/27/2017

## 2017-06-28 ENCOUNTER — Emergency Department (HOSPITAL_COMMUNITY): Payer: Medicare HMO

## 2017-06-28 ENCOUNTER — Other Ambulatory Visit: Payer: Self-pay

## 2017-06-28 DIAGNOSIS — R1084 Generalized abdominal pain: Secondary | ICD-10-CM | POA: Diagnosis not present

## 2017-06-28 DIAGNOSIS — R109 Unspecified abdominal pain: Secondary | ICD-10-CM | POA: Diagnosis not present

## 2017-06-28 MED ORDER — IOPAMIDOL (ISOVUE-300) INJECTION 61%
100.0000 mL | Freq: Once | INTRAVENOUS | Status: AC | PRN
  Administered 2017-06-28: 100 mL via INTRAVENOUS

## 2017-06-28 MED ORDER — CLONIDINE HCL 0.2 MG PO TABS
0.3000 mg | ORAL_TABLET | Freq: Once | ORAL | Status: AC
  Administered 2017-06-28: 0.3 mg via ORAL
  Filled 2017-06-28: qty 1

## 2017-06-28 MED ORDER — POLYETHYLENE GLYCOL 3350 17 G PO PACK: 17.0000 g | PACK | Freq: Every day | ORAL | 0 refills | Status: DC

## 2017-06-28 MED ORDER — METOPROLOL TARTRATE 50 MG PO TABS
50.0000 mg | ORAL_TABLET | Freq: Once | ORAL | Status: AC
  Administered 2017-06-28: 50 mg via ORAL
  Filled 2017-06-28: qty 1

## 2017-06-28 NOTE — ED Notes (Signed)
Pt returned from CT Scan 

## 2017-06-28 NOTE — Patient Outreach (Signed)
[facility]) Care Management  06/28/2017  [name] 1950-10-08 [id number]    Telephone Screen Referral Date : 06/28/2017 Referral Source: [facility] ED Census Referral Reason: ED Utilization  Insurance: [facility]  Outreach attempt # 1 To patient.  Social: Telephone call placed to the patient for screening. The patient states that she lives alone. The patient verbalized that she is independent assist with her  ADLS and dependent assist with her IADLS.  She has transportation to her appointment.  The patient states that she has a walker, cane , wheelchair  and CBG meter in the home.  The patient states that she has [place] from [name] come to the home and fill her pill box and take hr vitals.  She has an aid that also come to the home.   Conditions: Per chart review the patient has HTN, GERD Hypothyroidism, COPD and DM. The patient was seen in the ED on 06/27/2017 and had a Blood Pressure of 201/76.  Medications: The patient states that she has about 20 medications.  She states that she is adherent with all of her medications.  She is able to afford her medications.  Appointments:  Her next appointment with Dr Moshe Cipro is 29th of May  Consent/ Services: HIPAA verified. Discussed and offered [facility] care management services with patient. Patient verbally agreed to services.    Plan: RN Health Coach will make an outreach attempt to the patient in the month of April.   [name] RN, BSN, [place] Direct Dial:  [phone] Fax: [phone]

## 2017-06-28 NOTE — Discharge Instructions (Addendum)
Make sure you take your home medicines, especially your blood pressure medicines as your blood pressure was elevated tonight and missing doses of your clonidine can really cause big spikes in your blood pressure.  See your GI doctor this week as you have scheduled.  You may take the miralax prescribed to help with your constipation. Drinking plenty of liquids can also help prevent constipation.

## 2017-06-28 NOTE — ED Notes (Signed)
Patient transported to CT 

## 2017-06-29 ENCOUNTER — Other Ambulatory Visit: Payer: Self-pay

## 2017-06-29 NOTE — Patient Outreach (Signed)
[place] [facility]) Care Management  [facility] Care Manager  06/29/2017   [name] [birth year] [id number]  Subjective: Telephone call placed to the patient for initial assessment.  HIPAA verified.  The patient states that she lives alone. The patient verbalized that she is independent assist with her  ADLS and dependent assist with her IADLS.  She has transportation to her appointments.  The patient states that she has a walker, cane , wheelchair blood pressure cuff and CBG meter  in the home.  The patient states that she has [place] from [name] come to the home and fill her pill box and take her vitals.  She has an aid that also come to the home Monday -Friday six hours and two hours on Saturday and Sunday. She states that she had a Health care power of attorney and it was stolen but she has new paper work and will have it filled out.  RN Health Coach advised the patient to give a copy to her physician office.  The patient verbalized understanding.   She state that she takes her medications but sometimes she forgets. The Patient was seen in the ED 4/117/19 and her blood pressure was 201/76.  RN Health Coach reiterated to the patient the importance of taking her medications as prescribed and how it can affect her condition. RN Health Coach reminded the patient that a pharmacist will be calling her and the procedure.  She stated that she understood and would accept the call. The patient stated that she has had falls and has an unstable gait.  RN Health Coach Discussed fall precautions with the patient and she verbalized understanding.  The patient state that has an appointment to have iron infusions next week.  Encounter Medications:  Outpatient Encounter Medications as of 06/29/2017  Medication Sig  . ACCU-CHEK SMARTVIEW test strip TEST BLOOD SUGAR FOUR TIMES DAILY AS DIRECTED  . albuterol (PROVENTIL HFA;VENTOLIN HFA) 108 (90 Base) MCG/ACT inhaler Inhale 1-2 puffs every 6 hours as  needed for wheezing, shortness of breath  . ascorbic acid (VITAMIN C) 500 MG tablet Take 500 mg by mouth daily.  [name] aspirin EC 81 MG tablet Take 81 mg by mouth daily.  . cetirizine (ZYRTEC) 10 MG tablet Take 1 tablet (10 mg total) by mouth daily.  . Cholecalciferol (VITAMIN D3) 5000 units CAPS Take 1 capsule by mouth daily.  . cloNIDine (CATAPRES) 0.3 MG tablet TAKE 1 TABLET BY MOUTH EVERY EIGHT HOURS. ONCE AT 8AM, 4PM, AND 12 MIDNIGHT.  [name] HYDROcodone-acetaminophen (NORCO/VICODIN) 5-325 MG tablet Take 1 pill every 4-6 hours for pain not relieved by your normal pain medicine  . hydroxychloroquine (PLAQUENIL) 200 MG tablet Take 200 mg by mouth daily.   . insulin aspart (NOVOLOG FLEXPEN) 100 UNIT/ML FlexPen INJECT 10 TO 14 UNITS INTO THE SKIN 3 (THREE) TIMES DAILY WITH MEALS.  [name] Insulin Glargine (TOUJEO SOLOSTAR) 300 UNIT/ML SOPN Inject 20 Units into the skin at bedtime.  . Insulin Pen Needle (CAREFINE PEN NEEDLES) 32G X 4 MM MISC Use 4x a day  . lamoTRIgine (LAMICTAL) 100 MG tablet TAKE 1 TABLET BY MOUTH TWICE DAILY.  [name] levothyroxine (SYNTHROID, LEVOTHROID) 50 MCG tablet One tab once daily and one half tab on sunday (Patient taking differently: Take 50 mcg by mouth daily before breakfast. One tab once daily and one half tab on sunday)  . LORazepam (ATIVAN) 0.5 MG tablet Take 1 tablet (0.5 mg total) by mouth 3 (three) times daily.  [name] losartan (  COZAAR) 50 MG tablet Take 1 tablet (50 mg total) by mouth daily.  . metoprolol tartrate (LOPRESSOR) 50 MG tablet Take 1 tablet (50 mg total) by mouth 2 (two) times daily. Need office visit  . montelukast (SINGULAIR) 10 MG tablet Take 1 tablet (10 mg total) by mouth daily.  . Multiple Vitamins-Minerals (HM MULTIVITAMIN ADULT GUMMY PO) Take 1 tablet by mouth daily.  . polyethylene glycol (MIRALAX) packet Take 17 g by mouth daily.  . pregabalin (LYRICA) 75 MG capsule Take 1 capsule (75 mg total) by mouth daily.  . RABEprazole (ACIPHEX) 20 MG tablet TAKE 1 TABLET  BY MOUTH TWICE DAILY.  [name] RESTASIS 0.05 % ophthalmic emulsion Place 1 drop into both eyes 2 (two) times daily.   . risperiDONE (RISPERDAL) 0.5 MG tablet Take 1 tablet (0.5 mg total) by mouth at bedtime.  . rosuvastatin (CRESTOR) 5 MG tablet Take 1 tablet (5 mg total) by mouth at bedtime.  . sertraline (ZOLOFT) 100 MG tablet Take 1 tablet (100 mg total) by mouth daily.  . traMADol (ULTRAM) 50 MG tablet Take 1 tablet (50 mg total) by mouth every 6 (six) hours as needed. (Patient taking differently: Take 50 mg by mouth 3 (three) times daily as needed for moderate pain. )  . traZODone (DESYREL) 100 MG tablet Take 1 tablet (100 mg total) by mouth at bedtime. (Patient taking differently: Take 200 mg by mouth at bedtime. )  . predniSONE (DELTASONE) 5 MG tablet One tablet twice daily for 5 days (Patient not taking: Reported on 06/27/2017)   No facility-administered encounter medications on file as of 06/29/2017.     Functional Status:  In your present state of health, do you have any difficulty performing the following activities: 04/30/2017  Hearing? N  Vision? N  Difficulty concentrating or making decisions? N  Walking or climbing stairs? Y  Dressing or bathing? Y  Doing errands, shopping? Y  Preparing Food and eating ? N  Using the Toilet? N  In the past six months, have you accidently leaked urine? Y  Do you have problems with loss of bowel control? N  Managing your Medications? Y  Managing your Finances? Y  Housekeeping or managing your Housekeeping? Y  Some recent data might be hidden    Fall/Depression Screening: Fall Risk  06/29/2017 06/20/2017 04/30/2017  Falls in the past year? Yes Yes Yes  Number falls in past yr: 2 or more 2 or more 2 or more  Injury with Fall? No No No  Comment - - -  Risk Factor Category  High Fall Risk High Fall Risk High Fall Risk  Risk for fall due to : History of fall(s);Impaired balance/gait History of fall(s);Impaired balance/gait;Impaired  mobility;Medication side effect History of fall(s);Impaired balance/gait;Medication side effect;Impaired mobility  Follow up Education provided;Falls evaluation completed;Falls prevention discussed Education provided;Falls evaluation completed;Falls prevention discussed Education provided;Falls evaluation completed;Falls prevention discussed   PHQ 2/9 Scores 06/29/2017 06/28/2017 04/30/2017 04/11/2017 12/06/2016 09/25/2016 04/21/2015  PHQ - 2 Score 1 1 1 1 2 2 1   PHQ- 9 Score - - - - 5 9 -    Assessment: Patient will benefit from health coach outreach for disease management and support.   THN CM Care Plan Problem One     Most Recent Value  Care Plan Problem One  Knowledge deficit related to diease management  Role Documenting the Problem One  [place] for Problem One  Active  THN Long Term Goal   In 90 days  the patient will verbalize lowering her blood pressure and no hospital visits  [place] Goal Start Date  06/29/17  Interventions for Problem One Long Term Goal  [facility] will send educational information for the patient to review with her aid  THN CM Short Term Goal #1   IN 30 days the patient will state adherence with her medications  THN CM Short Term Goal #1 Start Date  06/29/17  Interventions for Short Term Goal #1  Discussed medication adherence with the patient and how it is important to her health.  THN CM Short Term Goal #2   In 30 days the patient  will verbalize/ safety precautions  THN CM Short Term Goal #2 Start Date  06/29/17  Interventions for Short Term Goal #2  [facility] will send educational material and discussed with the patient fall precastuions and safty     Plan: RN Health Coach will provide ongoing education for patient on hypertension through phone calls and sending printed information to patient for further discussion.  RN Health Coach will send welcome packet with consent to patient as well as printed information on hypertension.  RN Health Coach will send  initial barriers letter, assessment, and care plan to primary care physician. RN Health Coach will contact patient in the month of May and patient agrees to next outreach.  [name] RN, BSN, [place] Direct Dial:  [phone]  Fax: [phone]

## 2017-07-01 DIAGNOSIS — I639 Cerebral infarction, unspecified: Secondary | ICD-10-CM | POA: Diagnosis not present

## 2017-07-01 DIAGNOSIS — R32 Unspecified urinary incontinence: Secondary | ICD-10-CM | POA: Diagnosis not present

## 2017-07-01 DIAGNOSIS — E119 Type 2 diabetes mellitus without complications: Secondary | ICD-10-CM | POA: Diagnosis not present

## 2017-07-01 DIAGNOSIS — M150 Primary generalized (osteo)arthritis: Secondary | ICD-10-CM | POA: Diagnosis not present

## 2017-07-02 ENCOUNTER — Telehealth: Payer: Self-pay | Admitting: Family Medicine

## 2017-07-02 DIAGNOSIS — I69354 Hemiplegia and hemiparesis following cerebral infarction affecting left non-dominant side: Secondary | ICD-10-CM | POA: Diagnosis not present

## 2017-07-02 DIAGNOSIS — I110 Hypertensive heart disease with heart failure: Secondary | ICD-10-CM | POA: Diagnosis not present

## 2017-07-02 DIAGNOSIS — E1142 Type 2 diabetes mellitus with diabetic polyneuropathy: Secondary | ICD-10-CM | POA: Diagnosis not present

## 2017-07-02 DIAGNOSIS — F250 Schizoaffective disorder, bipolar type: Secondary | ICD-10-CM | POA: Diagnosis not present

## 2017-07-02 DIAGNOSIS — Z794 Long term (current) use of insulin: Secondary | ICD-10-CM | POA: Diagnosis not present

## 2017-07-02 DIAGNOSIS — I509 Heart failure, unspecified: Secondary | ICD-10-CM | POA: Diagnosis not present

## 2017-07-02 DIAGNOSIS — J449 Chronic obstructive pulmonary disease, unspecified: Secondary | ICD-10-CM | POA: Diagnosis not present

## 2017-07-02 DIAGNOSIS — Z87891 Personal history of nicotine dependence: Secondary | ICD-10-CM | POA: Diagnosis not present

## 2017-07-02 DIAGNOSIS — R296 Repeated falls: Secondary | ICD-10-CM | POA: Diagnosis not present

## 2017-07-02 NOTE — Telephone Encounter (Signed)
Just need last ov addended if you agree

## 2017-07-02 NOTE — Telephone Encounter (Signed)
Please send lift chair RX to [place]..-and she said to tell you Hey Baby

## 2017-07-03 DIAGNOSIS — E1142 Type 2 diabetes mellitus with diabetic polyneuropathy: Secondary | ICD-10-CM | POA: Diagnosis not present

## 2017-07-03 DIAGNOSIS — I110 Hypertensive heart disease with heart failure: Secondary | ICD-10-CM | POA: Diagnosis not present

## 2017-07-03 DIAGNOSIS — R296 Repeated falls: Secondary | ICD-10-CM | POA: Diagnosis not present

## 2017-07-03 DIAGNOSIS — Z87891 Personal history of nicotine dependence: Secondary | ICD-10-CM | POA: Diagnosis not present

## 2017-07-03 DIAGNOSIS — J449 Chronic obstructive pulmonary disease, unspecified: Secondary | ICD-10-CM | POA: Diagnosis not present

## 2017-07-03 DIAGNOSIS — I509 Heart failure, unspecified: Secondary | ICD-10-CM | POA: Diagnosis not present

## 2017-07-03 DIAGNOSIS — I69354 Hemiplegia and hemiparesis following cerebral infarction affecting left non-dominant side: Secondary | ICD-10-CM | POA: Diagnosis not present

## 2017-07-03 DIAGNOSIS — Z794 Long term (current) use of insulin: Secondary | ICD-10-CM | POA: Diagnosis not present

## 2017-07-03 DIAGNOSIS — F250 Schizoaffective disorder, bipolar type: Secondary | ICD-10-CM | POA: Diagnosis not present

## 2017-07-04 ENCOUNTER — Other Ambulatory Visit: Payer: Self-pay

## 2017-07-04 NOTE — Patient Outreach (Signed)
[facility]) Care Management  07/04/2017  [name] [birth year] [id number]   67 year old female referred to [facility] for medication management.  [place] services requested for polypharmacy med review.  PMHx includes, but not limited to, hypertension, GERD, COPD, Type 2 diabetes mellitus, hyperlipidemia, and  hypothyroidism.  Successful outreach call placed to [name].  HIPAA identifiers verified.  Subjective:  [name] states she is not able to tell me what medications she takes.    Plan: Home visit scheduled to review patient medications on 4/30 at 11:30.  Joetta Manners, PharmD Clinical Pharmacist [place] [phone]

## 2017-07-05 ENCOUNTER — Encounter (HOSPITAL_COMMUNITY): Payer: Self-pay

## 2017-07-05 ENCOUNTER — Inpatient Hospital Stay (HOSPITAL_COMMUNITY): Payer: Medicare HMO

## 2017-07-05 VITALS — BP 144/64 | HR 64 | Temp 98.3°F | Resp 16

## 2017-07-05 DIAGNOSIS — I509 Heart failure, unspecified: Secondary | ICD-10-CM | POA: Diagnosis not present

## 2017-07-05 DIAGNOSIS — I69354 Hemiplegia and hemiparesis following cerebral infarction affecting left non-dominant side: Secondary | ICD-10-CM | POA: Diagnosis not present

## 2017-07-05 DIAGNOSIS — F319 Bipolar disorder, unspecified: Secondary | ICD-10-CM | POA: Diagnosis not present

## 2017-07-05 DIAGNOSIS — Z87891 Personal history of nicotine dependence: Secondary | ICD-10-CM | POA: Diagnosis not present

## 2017-07-05 DIAGNOSIS — Z794 Long term (current) use of insulin: Secondary | ICD-10-CM | POA: Diagnosis not present

## 2017-07-05 DIAGNOSIS — D649 Anemia, unspecified: Secondary | ICD-10-CM | POA: Diagnosis not present

## 2017-07-05 DIAGNOSIS — I110 Hypertensive heart disease with heart failure: Secondary | ICD-10-CM | POA: Diagnosis not present

## 2017-07-05 DIAGNOSIS — R296 Repeated falls: Secondary | ICD-10-CM | POA: Diagnosis not present

## 2017-07-05 DIAGNOSIS — D509 Iron deficiency anemia, unspecified: Secondary | ICD-10-CM

## 2017-07-05 DIAGNOSIS — F250 Schizoaffective disorder, bipolar type: Secondary | ICD-10-CM | POA: Diagnosis not present

## 2017-07-05 DIAGNOSIS — F329 Major depressive disorder, single episode, unspecified: Secondary | ICD-10-CM | POA: Diagnosis not present

## 2017-07-05 DIAGNOSIS — J449 Chronic obstructive pulmonary disease, unspecified: Secondary | ICD-10-CM | POA: Diagnosis not present

## 2017-07-05 DIAGNOSIS — K5900 Constipation, unspecified: Secondary | ICD-10-CM | POA: Diagnosis not present

## 2017-07-05 DIAGNOSIS — F419 Anxiety disorder, unspecified: Secondary | ICD-10-CM | POA: Diagnosis not present

## 2017-07-05 DIAGNOSIS — E1142 Type 2 diabetes mellitus with diabetic polyneuropathy: Secondary | ICD-10-CM | POA: Diagnosis not present

## 2017-07-05 DIAGNOSIS — M129 Arthropathy, unspecified: Secondary | ICD-10-CM | POA: Diagnosis not present

## 2017-07-05 DIAGNOSIS — Z79899 Other long term (current) drug therapy: Secondary | ICD-10-CM | POA: Diagnosis not present

## 2017-07-05 MED ORDER — SODIUM CHLORIDE 0.9 % IV SOLN
Freq: Once | INTRAVENOUS | Status: AC
  Administered 2017-07-05: 500 mL via INTRAVENOUS

## 2017-07-05 MED ORDER — SODIUM CHLORIDE 0.9% FLUSH
10.0000 mL | INTRAVENOUS | Status: DC | PRN
Start: 2017-07-05 — End: 2017-07-05
  Administered 2017-07-05: 10 mL
  Filled 2017-07-05: qty 10

## 2017-07-05 MED ORDER — SODIUM CHLORIDE 0.9 % IV SOLN
510.0000 mg | Freq: Once | INTRAVENOUS | Status: AC
  Administered 2017-07-05: 510 mg via INTRAVENOUS
  Filled 2017-07-05: qty 17

## 2017-07-05 NOTE — Patient Instructions (Signed)

## 2017-07-05 NOTE — Progress Notes (Signed)
Patient tolerated iron infusion with no complaints voiced.  Good blood return noted before and after administration of iron.  Peripheral IV site clean and dry with no bruising or swelling noted at site.  Denied pain at site.  Band aid applied.  VSS with discharge and left ambulatory with family.  No s/s of distress noted.

## 2017-07-06 ENCOUNTER — Ambulatory Visit (INDEPENDENT_AMBULATORY_CARE_PROVIDER_SITE_OTHER): Payer: Medicare HMO | Admitting: Physician Assistant

## 2017-07-06 ENCOUNTER — Encounter: Payer: Self-pay | Admitting: Physician Assistant

## 2017-07-06 VITALS — BP 88/42 | HR 59 | Ht 61.0 in | Wt 157.0 lb

## 2017-07-06 DIAGNOSIS — K5900 Constipation, unspecified: Secondary | ICD-10-CM

## 2017-07-06 DIAGNOSIS — K5730 Diverticulosis of large intestine without perforation or abscess without bleeding: Secondary | ICD-10-CM

## 2017-07-06 DIAGNOSIS — K581 Irritable bowel syndrome with constipation: Secondary | ICD-10-CM | POA: Diagnosis not present

## 2017-07-06 DIAGNOSIS — R1012 Left upper quadrant pain: Secondary | ICD-10-CM

## 2017-07-06 DIAGNOSIS — K219 Gastro-esophageal reflux disease without esophagitis: Secondary | ICD-10-CM | POA: Diagnosis not present

## 2017-07-06 DIAGNOSIS — Z86010 Personal history of colonic polyps: Secondary | ICD-10-CM

## 2017-07-06 MED ORDER — LIDOCAINE HCL URETHRAL/MUCOSAL 2 % EX GEL: CUTANEOUS | 3 refills | Status: DC

## 2017-07-06 MED ORDER — DICYCLOMINE HCL 10 MG PO CAPS: ORAL_CAPSULE | ORAL | 6 refills | Status: DC

## 2017-07-06 MED ORDER — RABEPRAZOLE SODIUM 20 MG PO TBEC: 20.0000 mg | DELAYED_RELEASE_TABLET | Freq: Two times a day (BID) | ORAL | 3 refills | Status: DC

## 2017-07-06 NOTE — Patient Instructions (Signed)
We sent prescriptions to [facility] [place]. 1. Aciphex 20 mg 2. Bentyl ( dicyclomine) 10 mg 3. Lidocaine Jelly 2 %  Take Miralax 17 grams in 8 oz of water twice daily.  Call o ut office in June to make an appointment to see Dr. Hilarie Fredrickson in August.   If you are age 66 or older, your body mass index should be between 23-30. Your Body mass index is 29.66 kg/m. If this is out of the aforementioned range listed, please consider follow up with your Primary Care Provider.

## 2017-07-06 NOTE — Progress Notes (Addendum)
Subjective:    Patient ID: [name], female    DOB: 1950-12-11, 67 y.o.   MRN: [id number]  HPI [name] is a pleasant 67 year old African-American female, known to Dr. Hilarie Fredrickson who comes in today with complaints of left-sided abdominal pain and bloating.  She was last seen in our office in August 2018 at that time with an episode of severe constipation and required bowel prep for purging.  Last colonoscopy was done in November 2017 with finding of multiple diverticuli and 2 sessile polyps which were tubular adenomas. She was admitted in June 2018 with a small bowel obstruction which resolved with conservative management. She has multiple medical problems including pulmonary hypertension, COPD, obstructive sleep apnea, IBS, seizure disorder, bipolar disorder, adult onset diabetes mellitus with history of gastroparesis and history of CVA with left hemiparesis. Patient had an ER visit earlier in April with complaints of abdominal pain and no bowel movement for 5 to 6 days.  She had no evidence of impaction on rectal exam.  Labs were stable and she had CT of the abdomen and pelvis done which did show her to be status post cholecystectomy, mild diverticulosis without diverticulitis, no significant obstipation and no obstruction. She is also recently been evaluated by hematology for chronic iron deficiency anemia which is felt in part due to chronic kidney disease.  She is scheduled to have an iron infusion next week. Patient says she has had some ongoing problems with left-sided abdominal discomfort and a bloated sensation.  She had been given pain medications in the past which she did not find helpful.  She says her side hurts her all the time and feels different than her right side.  She says when she injects her insulin usually she cannot even feel it on her left side and says she is very sensitive to any pressure or anything touching her left side.  She says she wears her slacks down in her lower abdomen  because she does not like anything tight over her abdomen.  She still goes 3 to 4 days without a bowel movement and describes dull crampy pain intermittently.  Review of Systems Pertinent positive and negative review of systems were noted in the above HPI section.  All other review of systems was otherwise negative.  Outpatient Encounter Medications as of 07/06/2017  Medication Sig  . ACCU-CHEK SMARTVIEW test strip TEST BLOOD SUGAR FOUR TIMES DAILY AS DIRECTED  . albuterol (PROVENTIL HFA;VENTOLIN HFA) 108 (90 Base) MCG/ACT inhaler Inhale 1-2 puffs every 6 hours as needed for wheezing, shortness of breath  . ascorbic acid (VITAMIN C) 500 MG tablet Take 500 mg by mouth daily.  [name] aspirin EC 81 MG tablet Take 81 mg by mouth daily.  . cetirizine (ZYRTEC) 10 MG tablet Take 1 tablet (10 mg total) by mouth daily.  . Cholecalciferol (VITAMIN D3) 5000 units CAPS Take 1 capsule by mouth daily.  . cloNIDine (CATAPRES) 0.3 MG tablet TAKE 1 TABLET BY MOUTH EVERY EIGHT HOURS. ONCE AT 8AM, 4PM, AND 12 MIDNIGHT.  [name] HYDROcodone-acetaminophen (NORCO/VICODIN) 5-325 MG tablet Take 1 pill every 4-6 hours for pain not relieved by your normal pain medicine  . hydroxychloroquine (PLAQUENIL) 200 MG tablet Take 200 mg by mouth daily.   . insulin aspart (NOVOLOG FLEXPEN) 100 UNIT/ML FlexPen INJECT 10 TO 14 UNITS INTO THE SKIN 3 (THREE) TIMES DAILY WITH MEALS.  [name] Insulin Glargine (TOUJEO SOLOSTAR) 300 UNIT/ML SOPN Inject 20 Units into the skin at bedtime.  . Insulin Pen Needle (  CAREFINE PEN NEEDLES) 32G X 4 MM MISC Use 4x a day  . lamoTRIgine (LAMICTAL) 100 MG tablet TAKE 1 TABLET BY MOUTH TWICE DAILY.  [name] levothyroxine (SYNTHROID, LEVOTHROID) 50 MCG tablet One tab once daily and one half tab on sunday (Patient taking differently: Take 50 mcg by mouth daily before breakfast. One tab once daily and one half tab on sunday)  . LORazepam (ATIVAN) 0.5 MG tablet Take 1 tablet (0.5 mg total) by mouth 3 (three) times daily.  [name]  losartan (COZAAR) 50 MG tablet Take 1 tablet (50 mg total) by mouth daily.  . metoprolol tartrate (LOPRESSOR) 50 MG tablet Take 1 tablet (50 mg total) by mouth 2 (two) times daily. Need office visit  . montelukast (SINGULAIR) 10 MG tablet Take 1 tablet (10 mg total) by mouth daily.  . Multiple Vitamins-Minerals (HM MULTIVITAMIN ADULT GUMMY PO) Take 1 tablet by mouth daily.  . polyethylene glycol (MIRALAX) packet Take 17 g by mouth daily.  . predniSONE (DELTASONE) 5 MG tablet One tablet twice daily for 5 days  . pregabalin (LYRICA) 75 MG capsule Take 1 capsule (75 mg total) by mouth daily.  . RABEprazole (ACIPHEX) 20 MG tablet Take 1 tablet (20 mg total) by mouth 2 (two) times daily.  . RESTASIS 0.05 % ophthalmic emulsion Place 1 drop into both eyes 2 (two) times daily.   . risperiDONE (RISPERDAL) 0.5 MG tablet Take 1 tablet (0.5 mg total) by mouth at bedtime.  . rosuvastatin (CRESTOR) 5 MG tablet Take 1 tablet (5 mg total) by mouth at bedtime.  . sertraline (ZOLOFT) 100 MG tablet Take 1 tablet (100 mg total) by mouth daily.  . traMADol (ULTRAM) 50 MG tablet Take 1 tablet (50 mg total) by mouth every 6 (six) hours as needed. (Patient taking differently: Take 50 mg by mouth 3 (three) times daily as needed for moderate pain. )  . traZODone (DESYREL) 100 MG tablet Take 1 tablet (100 mg total) by mouth at bedtime. (Patient taking differently: Take 200 mg by mouth at bedtime. )  . [DISCONTINUED] RABEprazole (ACIPHEX) 20 MG tablet TAKE 1 TABLET BY MOUTH TWICE DAILY.  [name] dicyclomine (BENTYL) 10 MG capsule Take 1 tab by mouth twice daily as needed for abdominal cramping.  . lidocaine (XYLOCAINE) 2 % jelly Apply to left abdominal wall twice daily.   No facility-administered encounter medications on file as of 07/06/2017.    Allergies  Allergen Reactions  . Cephalexin Hives  . Iron Nausea And Vomiting  . Milk-Related Compounds Other (See Comments)    Doesn't agree with stomach.   . Penicillins Hives     Has patient had a PCN reaction causing immediate rash, facial/tongue/throat swelling, SOB or lightheadedness with hypotension: Yes Has patient had a PCN reaction causing severe rash involving mucus membranes or skin necrosis: No Has patient had a PCN reaction that required hospitalization No Has patient had a PCN reaction occurring within the last 10 years: No If all of the above answers are "NO", then may proceed with Cephalosporin use.   [name] Phenazopyridine Hcl Hives         Patient Active Problem List   Diagnosis Date Noted  . At risk for cardiovascular event 06/10/2017  . 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 Rash and nonspecific skin eruption 04/25/2017  . Finger pain, right 01/28/2017  . Hypotension   . Diabetic polyneuropathy associated with type 2 diabetes mellitus ([place]) 05/19/2016  . Smoker 04/24/2016  . History of palpitations 08/09/2015  . Nausea without  vomiting 08/09/2015  . Labile hypertension 08/03/2015  . Normal coronary arteries 08/03/2015  . Left-sided low back pain with left-sided sciatica 06/27/2015  . Left flank pain 06/27/2015  . Type 2 diabetes mellitus with hyperglycemia, with long-term current use of insulin ([place]) 05/06/2015  . Multinodular goiter 05/06/2015  . Rectocele, female 04/27/2015  . Anal sphincter incontinence 04/27/2015  . Pelvic relaxation due to rectocele 03/30/2015  . Pulmonary hypertension ([place]) 02/22/2015  . Light cigarette smoker (1-9 cigarettes per day) 01/11/2015  . Migraine without aura and without status migrainosus, not intractable 07/02/2014  . Flatulence 02/18/2014  . Microcytic anemia 02/18/2014  . COPD (chronic obstructive pulmonary disease) with chronic bronchitis ([place]) 09/16/2013  . Acute head trauma 08/16/2013  . Hypothyroidism 08/16/2013  . Gastroparesis 04/28/2013  . Seizure disorder ([place]) 01/19/2013  . Cervical disc disorder with radiculopathy of cervical region 10/31/2012  . Solitary pulmonary nodule 08/19/2012  . Anemia  07/05/2012  . 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 Allergic sinusitis 04/18/2012  . Meningioma ([place]) 11/19/2011  . 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 Carpal tunnel syndrome of right wrist 05/23/2011  . Polypharmacy 04/28/2011  . Bipolar disorder ([place]) 04/28/2011  . Constipation 04/13/2011  . Falls frequently 12/12/2010  . Oropharyngeal dysphagia 07/12/2010  . Urinary incontinence 12/16/2009  . HEARING LOSS 10/26/2009  . Hyperlipidemia 12/11/2008  . IBS 12/11/2008  . GERD 07/29/2008  . MILK PRODUCTS ALLERGY 07/29/2008  . 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 Backache 06/19/2007  . Osteoporosis 06/19/2007  . Obstructive sleep apnea 06/19/2007  . TRIGGER FINGER 04/18/2007  . DIVERTICULOSIS, COLON 11/13/2006   Social History   Socioeconomic History  . Marital status: Divorced    Spouse name: Not on file  . Number of children: 1  . Years of education: 53  . Highest education level: Not on file  Occupational History  . Occupation: Disabled  Social Needs  . Financial resource strain: Not on file  . Food insecurity:    Worry: Not on file    Inability: Not on file  . Transportation needs:    Medical: Not on file    Non-medical: Not on file  Tobacco Use  . Smoking status: Former Smoker    Packs/day: 0.25    Years: 7.00    Pack years: 1.75    Types: Cigarettes    Last attempt to quit: 03/13/2017    Years since quitting: 0.3  . Smokeless tobacco: Never Used  . Tobacco comment: continues to smoke - a little less than a 1/4 pack per da as of 05/01/2017  Substance and Sexual Activity  . Alcohol use: No    Alcohol/week: 0.0 oz    Comment:    . Drug use: No  . Sexual activity: Not Currently  Lifestyle  . Physical activity:    Days per week: Not on file    Minutes per session: Not on file  . Stress: Not on file  Relationships  . Social connections:    Talks on phone: Not on file    Gets together: Not on  file    Attends religious service: Not on file    Active member of club or organization: Not on file    Attends meetings of clubs or organizations: Not on file    Relationship status: Not on file  . Intimate partner violence:    Fear of current or ex partner: Not on file    Emotionally abused: Not on  file    Physically abused: Not on file    Forced sexual activity: Not on file  Other Topics Concern  . Not on file  Social History Narrative   Lives alone   Caffeine use: Drink coffee sometimes    Right handed     [name]'s family history includes Alcohol abuse in her maternal uncle; Cancer in her sister and sister; Diabetes in her brother, brother, and father; Heart attack in her maternal grandfather and mother; Hypertension in her brother and son; Kidney failure in her father; Pancreatic cancer in her sister; Pneumonia in her father; Sleep apnea in her son; Stroke in her maternal grandmother.      Objective:    Vitals:   07/06/17 1104  BP: (!) 88/42  Pulse: (!) 59    Physical Exam; well-developed older African-American female in no acute distress, accompanied by her young granddaughter blood pressure 188/42 pulse 60, height 5 foot 1, weight 157, BMI 29.6.  HEENT nontraumatic normocephalic EOMI PERRLA sclera anicteric, Cardiovascular regular rate and rhythm with S1-S2, Pulmonary decreased breath sounds bilaterally, Abdomen soft, she has some tenderness to very mild palpation of the left abdomen, there is no palpable mass or hepatosplenomegaly bowel sounds are present, Rectal exam not done, Extremities no clubbing cyanosis or edema skin warm dry, Neuro psych mood and affect appropriate       Assessment & Plan:   #68 67 year old African-American female with chronic abdominal pain, chronic constipation, IBS, diverticulosis ,diabetic gastroparesis. Her current complaints of left-sided abdominal discomfort are chronic and I think her pain complaints are secondary to abdominal wall pain  or neuropathic type pain and not intra-abdominal pathology.  #2 status post cholecystectomy #3.  History of small bowel obstruction secondary to adhesions 2018 #4.  History of adenomatous colon polyps, due for follow-up November 2022 #5 chronic GERD #6.  Seizure disorder #7.  Bipolar disorder #8.  History of prior CVA   #9 COPD #10.  Chronic iron deficiency anemia-followed by hematology  Plan; Restart AcipHex 20 mg p.o. twice daily which she has been out of. MiraLAX 17 g in 8 ounces of water twice daily; may take 3 times daily as needed We will give her a trial of lidocaine jelly 2% to apply to the left abdominal wall twice daily She will follow-up with Dr. Hilarie Fredrickson or myself as needed.  Plan follow-up colonoscopy November 2022.   Andrew Soria S [name] Trzcinski PA-C 07/06/2017   Cc: [name], MD   Addendum: Reviewed and agree with management. Pyrtle, Lajuan Lines, MD

## 2017-07-09 ENCOUNTER — Other Ambulatory Visit (HOSPITAL_COMMUNITY): Payer: Self-pay | Admitting: Psychiatry

## 2017-07-09 ENCOUNTER — Other Ambulatory Visit: Payer: Self-pay | Admitting: Internal Medicine

## 2017-07-09 ENCOUNTER — Telehealth: Payer: Self-pay | Admitting: Physician Assistant

## 2017-07-09 ENCOUNTER — Other Ambulatory Visit: Payer: Self-pay | Admitting: Family Medicine

## 2017-07-09 DIAGNOSIS — F250 Schizoaffective disorder, bipolar type: Secondary | ICD-10-CM | POA: Diagnosis not present

## 2017-07-09 DIAGNOSIS — Z87891 Personal history of nicotine dependence: Secondary | ICD-10-CM | POA: Diagnosis not present

## 2017-07-09 DIAGNOSIS — I69354 Hemiplegia and hemiparesis following cerebral infarction affecting left non-dominant side: Secondary | ICD-10-CM | POA: Diagnosis not present

## 2017-07-09 DIAGNOSIS — R296 Repeated falls: Secondary | ICD-10-CM | POA: Diagnosis not present

## 2017-07-09 DIAGNOSIS — J449 Chronic obstructive pulmonary disease, unspecified: Secondary | ICD-10-CM | POA: Diagnosis not present

## 2017-07-09 DIAGNOSIS — I110 Hypertensive heart disease with heart failure: Secondary | ICD-10-CM | POA: Diagnosis not present

## 2017-07-09 DIAGNOSIS — E1142 Type 2 diabetes mellitus with diabetic polyneuropathy: Secondary | ICD-10-CM | POA: Diagnosis not present

## 2017-07-09 DIAGNOSIS — I509 Heart failure, unspecified: Secondary | ICD-10-CM | POA: Diagnosis not present

## 2017-07-09 DIAGNOSIS — Z794 Long term (current) use of insulin: Secondary | ICD-10-CM | POA: Diagnosis not present

## 2017-07-09 NOTE — Telephone Encounter (Signed)
NO, lets see how she does over the next several days

## 2017-07-09 NOTE — Telephone Encounter (Signed)
Noted  

## 2017-07-09 NOTE — Telephone Encounter (Signed)
REFILL 

## 2017-07-09 NOTE — Telephone Encounter (Signed)
The patient calls with complaints of constipation and wanting to know if she needs to do more. She has not been taking Miralax daily. In fact she has only started it in the past few days because she did not have any. She has not have a bowel movement in a reported 3 to 4 days. Yesterday she "drank 2 cups of Milk of Magnesia" and taken a stool softener with a stimulant that she does not know the name of. She also has eaten prunes and plums. She is passing gas, some stool and she can feeling some cramping "like you get when you are going to have diarrhea."  She calls to ask what else she should do. Confirmed she is passing stool. She states she feels the need to go move her bowels starting. She feels some nausea but she is not vomiting. FSBS was 138 this morning.  I advised she not take any additional bowel stimulants. Concered about hydration if she begins passing liquid stool. Encourage fluids, not worrying too much about solid food. Check blood sugars before administering any insulin if she is not eating. Encouraged to begin following the recommendations given at her last visit now that she has Miralax with the goal being to have a daily bowel movement.  Any other recommendations?

## 2017-07-10 ENCOUNTER — Telehealth: Payer: Self-pay | Admitting: Physician Assistant

## 2017-07-10 ENCOUNTER — Telehealth: Payer: Self-pay

## 2017-07-10 ENCOUNTER — Other Ambulatory Visit: Payer: Self-pay

## 2017-07-10 NOTE — Telephone Encounter (Signed)
Spoke with the patient who reports she has used an enema today and has passed "a hard ball of stool." She is not eating except for breakfast because "I'm not hungry." She has had Miralax x 3 today. When I called the patient, she stated she was on the commode at that time. Called patient back. She report she has passed more stool. Per Nicoletta Ba, PA-C she is too drink lots of water, eat something like soup and take more Miralax if she feels she is still constipated. Patient then tells me she did use her heating pad a little to hurt abdomen because she did not get the lidocaine jelly yet. The pharmacy has to order it for her. Called to the [facility] nurse and shared this information with her.

## 2017-07-10 NOTE — Telephone Encounter (Signed)
Elta Guadeloupe calling because his company likes for them to report patient falls.. Patient states she fell on Sunday but was able to get herself back up. No injuries. No ED visit. His number is [phone]

## 2017-07-10 NOTE — Telephone Encounter (Signed)
[facility] calling in regards to patient, stating patient has called in with concerns about not having a BM in the last week. [name] was advised that pt was given advice yesterday but [name] from [facility] requesting to speak with nurse to make sure it is okay since pt has hx of bowel obstruction.

## 2017-07-10 NOTE — Telephone Encounter (Signed)
Please let her know I am sorry I have not ben able to attend to this as yet, but I ewiill have to review her last visit before I can send off the order . I will do this next week, lease let her know

## 2017-07-10 NOTE — Telephone Encounter (Signed)
Patient called back again regarding the lift chair. [name] needs the office note addended

## 2017-07-10 NOTE — Patient Outreach (Signed)
[facility]) Care Management  [place]   07/10/2017  [name] 1950-04-21 [id number]    67 year old female referred to [facility] due to polypharmacy and recent ED visit.  [place] services requested for medication management.  PMHx includes, but not limited to, anxiety, bipolar disorder, chronic back pain, fibromylagia, hypothyroidism, HTN, HLD, GERD, diverticulosis, IBS, meningioma s/p craniotomy, migraines, pulmonary hypertension, type 2 diabetes with neuropathy, seizures, stroke, and tobacco abuse. Multiple recent ED visits related to headaches, left arm pain, and weakness. Per notes, patient has a home health nurse from [facility] that fills pillbox every 2 weeks.   Successful home visit with [name]. HIPAA identifiers verified.  Subjective: Patient complains of constipation.  Reports she has not had a regular stool in 6-7 days.  She reports going to the ED on 4/17 for the same problem.  Patient reports passing marble like stool after straining.  She reports using stool softeners with and without laxatives, miralax, milk of magnesia, laxative suppositories, an enema and prune juice to no avail.   She also reports decreased use of her pain medications in hopes of improving her constipation.  Reports FBS of 134 this am with range from 88-243 documented last week with one outlier of 536 at bedtime on 4/26.  [name] reports that high glucose was from stress and pain.   Objective:  06/27/17 Scr 1.9mg /dL on 06/27/17 HgA1c 5.8% on 06/07/17  Current Medications: Current Outpatient Medications  Medication Sig Dispense Refill  . ACCU-CHEK SMARTVIEW test strip TEST BLOOD SUGAR FOUR TIMES DAILY AS DIRECTED 350 each 2  . albuterol (PROVENTIL HFA;VENTOLIN HFA) 108 (90 Base) MCG/ACT inhaler Inhale 1-2 puffs every 6 hours as needed for wheezing, shortness of breath 3 Inhaler 3  . ascorbic acid (VITAMIN C) 500 MG tablet Take 500 mg by mouth daily.    [name] aspirin  EC 81 MG tablet Take 81 mg by mouth daily.    . cetirizine (ZYRTEC) 10 MG tablet Take 1 tablet (10 mg total) by mouth daily. 30 tablet 1  . Cholecalciferol (VITAMIN D3) 5000 units CAPS Take 1 capsule by mouth daily.    . cloNIDine (CATAPRES) 0.3 MG tablet TAKE 1 TABLET BY MOUTH EVERY EIGHT HOURS. ONCE AT 8AM, 4PM, AND 12 MIDNIGHT. 270 tablet 1  . dicyclomine (BENTYL) 10 MG capsule Take 1 tab by mouth twice daily as needed for abdominal cramping. 60 capsule 6  . HYDROcodone-acetaminophen (NORCO/VICODIN) 5-325 MG tablet Take 1 pill every 4-6 hours for pain not relieved by your normal pain medicine 15 tablet 0  . hydroxychloroquine (PLAQUENIL) 200 MG tablet Take 200 mg by mouth daily.     . insulin aspart (NOVOLOG FLEXPEN) 100 UNIT/ML FlexPen INJECT 10 TO 14 UNITS INTO THE SKIN 3 (THREE) TIMES DAILY WITH MEALS. 45 mL 3  . Insulin Glargine (TOUJEO SOLOSTAR) 300 UNIT/ML SOPN Inject 20 Units into the skin at bedtime. 9 pen 3  . Insulin Pen Needle (CAREFINE PEN NEEDLES) 32G X 4 MM MISC Use 4x a day 300 each 3  . lamoTRIgine (LAMICTAL) 100 MG tablet TAKE 1 TABLET BY MOUTH TWICE DAILY. 60 tablet 0  . levothyroxine (SYNTHROID, LEVOTHROID) 50 MCG tablet TAKE 1 TABLET BY MOUTH ONCE DAILY AND 1/2 TABLET ON SUNDAYS. 30 tablet 0  . lidocaine (XYLOCAINE) 2 % jelly Apply to left abdominal wall twice daily. 30 mL 3  . LORazepam (ATIVAN) 0.5 MG tablet Take 1 tablet (0.5 mg total) by mouth 3 (  three) times daily. 270 tablet 2  . losartan (COZAAR) 50 MG tablet Take 1 tablet (50 mg total) by mouth daily. 90 tablet 1  . metoprolol tartrate (LOPRESSOR) 50 MG tablet Take 1 tablet (50 mg total) by mouth 2 (two) times daily. KEEP OV. 60 tablet 0  . montelukast (SINGULAIR) 10 MG tablet Take 1 tablet (10 mg total) by mouth daily. 90 tablet 1  . Multiple Vitamins-Minerals (HM MULTIVITAMIN ADULT GUMMY PO) Take 1 tablet by mouth daily.    . polyethylene glycol (MIRALAX) packet Take 17 g by mouth daily. 14 each 0  . predniSONE  (DELTASONE) 5 MG tablet One tablet twice daily for 5 days 10 tablet 0  . pregabalin (LYRICA) 75 MG capsule Take 1 capsule (75 mg total) by mouth daily.    . RABEprazole (ACIPHEX) 20 MG tablet Take 1 tablet (20 mg total) by mouth 2 (two) times daily. 60 tablet 3  . RESTASIS 0.05 % ophthalmic emulsion Place 1 drop into both eyes 2 (two) times daily.     . risperiDONE (RISPERDAL) 0.5 MG tablet Take 1 tablet (0.5 mg total) by mouth at bedtime. 90 tablet 2  . rosuvastatin (CRESTOR) 5 MG tablet TAKE 1 TABLET BY MOUTH AT BEDTIME. 90 tablet 0  . sertraline (ZOLOFT) 100 MG tablet Take 1 tablet (100 mg total) by mouth daily. 90 tablet 2  . traMADol (ULTRAM) 50 MG tablet Take 1 tablet (50 mg total) by mouth every 6 (six) hours as needed. (Patient taking differently: Take 50 mg by mouth 3 (three) times daily as needed for moderate pain. ) 15 tablet 0  . traZODone (DESYREL) 100 MG tablet Take 1 tablet (100 mg total) by mouth at bedtime. (Patient taking differently: Take 200 mg by mouth at bedtime. ) 90 tablet 2   No current facility-administered medications for this visit.     Functional Status: In your present state of health, do you have any difficulty performing the following activities: 04/30/2017  Hearing? N  Vision? N  Difficulty concentrating or making decisions? N  Walking or climbing stairs? Y  Dressing or bathing? Y  Doing errands, shopping? Y  Preparing Food and eating ? N  Using the Toilet? N  In the past six months, have you accidently leaked urine? Y  Do you have problems with loss of bowel control? N  Managing your Medications? Y  Managing your Finances? Y  Housekeeping or managing your Housekeeping? Y  Some recent data might be hidden    Fall/Depression Screening: Fall Risk  06/29/2017 06/20/2017 04/30/2017  Falls in the past year? Yes Yes Yes  Number falls in past yr: 2 or more 2 or more 2 or more  Injury with Fall? No No No  Comment - - -  Risk Factor Category  High Fall Risk  High Fall Risk High Fall Risk  Risk for fall due to : History of fall(s);Impaired balance/gait History of fall(s);Impaired balance/gait;Impaired mobility;Medication side effect History of fall(s);Impaired balance/gait;Medication side effect;Impaired mobility  Follow up Education provided;Falls evaluation completed;Falls prevention discussed Education provided;Falls evaluation completed;Falls prevention discussed Education provided;Falls evaluation completed;Falls prevention discussed   PHQ 2/9 Scores 06/29/2017 06/28/2017 04/30/2017 04/11/2017 12/06/2016 09/25/2016 04/21/2015  PHQ - 2 Score 1 1 1 1 2 2 1   PHQ- 9 Score - - - - 5 9 -   ASSESSMENT: Date Discharged from Hospital: 06/27/17 Date Medication Reconciliation Performed: 07/10/2017  Medications Discontinued at Discharge  Miralax 17 grams once daily.  New Medications at From office  visit 4/26 with gastroenerology  Lidocaine jelly 2%  Topical to left abdominal wall twice daily (has not started, pharmacy ordered)  Increase Miralax up to three times daily.  Patient was recently discharged from hospital and all medications have been reviewed.  Drugs sorted by system:  Neurologic/Psychologic: lamotrigine, lorazepam, meclizine, pregabalin,  risperidone, sertraline, trazodone  Cardiovascular: baby aspirin, clonidine, losartan, metoprolol tartrate, rosuvastatin  Pulmonary/Allergy: albuterol, montelukast   Gastrointestinal: dicyclomine, docusate sodium,  Polyethylene glycol, rabeprazole, simethicone   Endocrine: novolog, insulin glargine, levothyroxine   Topical: diclofenac sodium gel, lidocaine jelly, restasis ophthalmic, triamcinolone ointment  Pain: hydrocodone/APAP   Vitamins/Minerals: ascorbic acid, cholecalciferol, MVI  Miscellaneous: hydroxychloroquine,   Medications to avoid in the elderly:  Meclizine and Dicyclomine: Per the Beers List, the antihistamine in this medication is highly anticholinergic, clearance may be reduced  with advanced age, tolerance may develop when used as a hypnotic and should be avoided in the elderly. Antihistamine use in the elderly poses a greater risk of confusion, dry mouth, constipation, or other anticholinergic effects or toxicity. Per Beers list, medications with strong anticholinergic properties should be avoided in older adults with dementia and cognitive impairment, those with or at high risk of delirium as well as in patients with constipation.  Other issues noted:   Patient's main complaint today is constipation.  She reports feeling bloated, gassy and nauseated.  At her recent visit to ED, CT scan was negative for any GI findings.  Patient contacted GI yesterday and they recommended that she follow their Miralax recommendations from office visit on 4/26.  Patient had not be taking the Miralax as directed.   I encouraged patient to drink the Miralax up to three times daily.  I stressed the importance of staying well hydrated to help prevent improve/constipation.  I informed her to call GI back before the weekend if she has not a had a good stool.     Patient has no other complaints at time.  She did not report any hypoglycemic issues.  Patient requested that I ask Dr. Denna Haggard to call in her hydroxychloroquine prescription to [facility] mail order.    PLAN: Route note to Dr. Denna Haggard, requesting he send a  prescription to [facility] mail order for hydroxychloroquine.  Route note to GI, Dr. Gala Romney, about anticholinergic effects of dicyclomine.  I will follow up with patient Friday morning to see if she has improvement with her constipation.  Joetta Manners, PharmD Clinical Pharmacist [place] [phone]

## 2017-07-11 ENCOUNTER — Telehealth: Payer: Self-pay | Admitting: Family Medicine

## 2017-07-11 ENCOUNTER — Encounter (HOSPITAL_COMMUNITY): Payer: Self-pay | Admitting: Psychiatry

## 2017-07-11 ENCOUNTER — Ambulatory Visit (INDEPENDENT_AMBULATORY_CARE_PROVIDER_SITE_OTHER): Payer: Medicare HMO | Admitting: Psychiatry

## 2017-07-11 DIAGNOSIS — F331 Major depressive disorder, recurrent, moderate: Secondary | ICD-10-CM | POA: Diagnosis not present

## 2017-07-11 DIAGNOSIS — J449 Chronic obstructive pulmonary disease, unspecified: Secondary | ICD-10-CM | POA: Diagnosis not present

## 2017-07-11 DIAGNOSIS — I10 Essential (primary) hypertension: Secondary | ICD-10-CM | POA: Diagnosis not present

## 2017-07-11 NOTE — Telephone Encounter (Signed)
Aware that the Dr is unavailable and a shot cannot be authorized until next tues. She will call back then if still needed

## 2017-07-11 NOTE — Telephone Encounter (Signed)
Patient is requesting an injection for pain in her arm because her pain pills are making her constipated.  Cb#: [phone]

## 2017-07-11 NOTE — Telephone Encounter (Signed)
Patient aware.

## 2017-07-11 NOTE — Progress Notes (Signed)
Patient:  [name]                DOB: 1951/02/14          MR Number:  [id number]  Location:   [place]:  [place]                       [place],  [place],    [place]          Start:  Wednesday 07/11/2017 1:17 PM End:       Wednesday 07/11/2017  1:59 PM  Provider/Observer:     Maurice Small, MSW, LCSW   Chief Complaint:                                                    Depression  Reason For Service:               [name] is a 67 y.o. female who presents with a long standing history of recurrent periods of depression beginning when she was 68 and her favorite uncle died. She h         as been experiencing increased symptoms of depression recently due to stress in the relationship with her son. Per patient's report, he rarely visits her anymore due to his involvement with an older woman who is controlling. She also reports frustration regarding her knees who is her power of attorn   ey and is very demanding perpatient's report. She reports issues with other family members and states she needs help dealing with her family Patient reports multiple psychiatric hospitlaizations due to depression and suicidal ideations with thel last one occuring in 1997. Patient has participated in outpatient psychotherapy and medication management intermittently since age 58.  She currently is seeing psychiatrist Dr. Harrington [name] . Prior to this, she was being seen at [facility]. Patient also has had ECT at [facility].    Interventions Strategy:  Supportive  Participation Level:   Active      Participation Quality:  Appropriate      Behavioral Observation:  Casual, well groomed, speech very slow, drowsy   Current Psychosocial Factors: Tension in relationship with son, multiple health issues, concerns about grandson,   Content of Session:   reviewed symptoms, assisted patient identify stressors and  triggers of increased depressed mood, facilitated expression of thoughts and feelings,  validated feelings, discussed connection between stress and hallucinations, assisted patient identify ways to improve sleep hygiene and ways to manage disturbing dreams   Current Status:   Fluctuations in mood, worry,crying spells         Suicidal/Homicidal:    No   Patient Progress:   Patient last was seen about 2weeks ago. She reports continued stress regarding health. She reports constantly having to go to appointments and complains about having little time to just relax. She reports she did enjoy keeping 41 yo granddaughter last week. She also is pleased she heard from her grandson. She continues to express frustration about relationship between her son and grandson but has increased efforts to respect boundaries. She also has been relying on her spirituality. She reports having hallucinations 2 x since last session. She also reports having disturbing dreams about deceased relatives which has interfered with her sleep and caused worry.   Target Goals:  1.  Learn and implement behavioral strategies to overcome depression.    2.  Verbalize an understanding and resolution of current interpersonal problems.   Last Reviewed:   10/13/2016  Goals Addressed Today:    1,2  Plan:      Return again in 2 - 3 weeks.  Impression/Diagnosis:   Patient presents with long standing history of recurrent periods  of depression beginning when she was [age] and her favorite uncle died. Patient reports multiple psychiatric hospitlaizations due to depression and suicidal ideations with thel last one occuring in 1997. Patient has participated in outpatient psychotherapy and medication management intermittently since age 47.  She currently is seeing psychiatrist Dr. Harrington [name] . Prior to this, she was being seen at [facility]. Patient also has had ECT at [facility]. Symptoms have worsened in recent months due to family stress and have  included depressed mood, anxiety, excessive worry, and tearfulness.   Diagnosis:  Axis I: MDD  recurrent episode, moderate          Axis II: Deferred     [name] Mccaughey, LCSW 07/11/2017

## 2017-07-12 ENCOUNTER — Inpatient Hospital Stay (HOSPITAL_COMMUNITY): Payer: Medicare HMO | Attending: Hematology

## 2017-07-12 ENCOUNTER — Other Ambulatory Visit: Payer: Self-pay

## 2017-07-12 ENCOUNTER — Encounter (HOSPITAL_COMMUNITY): Payer: Self-pay

## 2017-07-12 VITALS — BP 193/81 | HR 77 | Temp 97.2°F | Resp 18

## 2017-07-12 DIAGNOSIS — D631 Anemia in chronic kidney disease: Secondary | ICD-10-CM | POA: Insufficient documentation

## 2017-07-12 DIAGNOSIS — R040 Epistaxis: Secondary | ICD-10-CM | POA: Insufficient documentation

## 2017-07-12 DIAGNOSIS — D509 Iron deficiency anemia, unspecified: Secondary | ICD-10-CM

## 2017-07-12 DIAGNOSIS — N189 Chronic kidney disease, unspecified: Secondary | ICD-10-CM | POA: Diagnosis not present

## 2017-07-12 MED ORDER — FERUMOXYTOL INJECTION 510 MG/17 ML
510.0000 mg | Freq: Once | INTRAVENOUS | Status: AC
  Administered 2017-07-12: 510 mg via INTRAVENOUS
  Filled 2017-07-12: qty 17

## 2017-07-12 MED ORDER — PROCHLORPERAZINE MALEATE 10 MG PO TABS
ORAL_TABLET | ORAL | Status: AC
  Filled 2017-07-12: qty 1

## 2017-07-12 MED ORDER — PROCHLORPERAZINE EDISYLATE 5 MG/ML IJ SOLN
10.0000 mg | Freq: Once | INTRAMUSCULAR | Status: DC
  Filled 2017-07-12: qty 2

## 2017-07-12 MED ORDER — PROCHLORPERAZINE MALEATE 10 MG PO TABS
10.0000 mg | ORAL_TABLET | Freq: Four times a day (QID) | ORAL | Status: DC | PRN
  Administered 2017-07-12: 10 mg via ORAL

## 2017-07-12 MED ORDER — SODIUM CHLORIDE 0.9 % IV SOLN
Freq: Once | INTRAVENOUS | Status: AC
  Administered 2017-07-12: 12:00:00 via INTRAVENOUS

## 2017-07-12 NOTE — Progress Notes (Signed)
Tolerated infusion w/o adverse reaction.  Alert, in no distress.  VSS.  Discharged ambulatory in c/o aide.

## 2017-07-13 ENCOUNTER — Other Ambulatory Visit: Payer: Self-pay

## 2017-07-13 DIAGNOSIS — R296 Repeated falls: Secondary | ICD-10-CM | POA: Diagnosis not present

## 2017-07-13 DIAGNOSIS — I69354 Hemiplegia and hemiparesis following cerebral infarction affecting left non-dominant side: Secondary | ICD-10-CM | POA: Diagnosis not present

## 2017-07-13 DIAGNOSIS — Z794 Long term (current) use of insulin: Secondary | ICD-10-CM | POA: Diagnosis not present

## 2017-07-13 DIAGNOSIS — I509 Heart failure, unspecified: Secondary | ICD-10-CM | POA: Diagnosis not present

## 2017-07-13 DIAGNOSIS — F250 Schizoaffective disorder, bipolar type: Secondary | ICD-10-CM | POA: Diagnosis not present

## 2017-07-13 DIAGNOSIS — I110 Hypertensive heart disease with heart failure: Secondary | ICD-10-CM | POA: Diagnosis not present

## 2017-07-13 DIAGNOSIS — Z87891 Personal history of nicotine dependence: Secondary | ICD-10-CM | POA: Diagnosis not present

## 2017-07-13 DIAGNOSIS — J449 Chronic obstructive pulmonary disease, unspecified: Secondary | ICD-10-CM | POA: Diagnosis not present

## 2017-07-13 DIAGNOSIS — E1142 Type 2 diabetes mellitus with diabetic polyneuropathy: Secondary | ICD-10-CM | POA: Diagnosis not present

## 2017-07-13 NOTE — Patient Outreach (Signed)
[facility]) Care Management  07/13/2017  Jahdai Padovano Mcilwain 10-28-1950 [id number]  67year old femalereferred to [facility] due to polypharmacy and recent ED visit. [place] services requested for medication management. PMHx includes, but not limited to, anxiety, bipolar disorder, chronic back pain, fibromylagia, hypothyroidism, HTN, HLD, GERD, diverticulosis, IBS, meningioma s/p craniotomy, migraines, pulmonary hypertension, type 2 diabetes with neuropathy, seizures, stroke, and tobacco abuse. Multiple recent ED visits related to headaches, left arm pain, and weakness. Per notes, patient has a home health nurse from [facility] that fills pillbox every 2 weeks.    Unsuccessful outreach attempt to Ms. Wynelle Cleveland.  HIPAA compliant voice message requesting return call.   Plan:  Call attempt on Monday 5/6 to see if patient's constipation has improved.  Joetta Manners, PharmD [place] [phone]

## 2017-07-16 ENCOUNTER — Ambulatory Visit: Payer: Self-pay

## 2017-07-16 ENCOUNTER — Other Ambulatory Visit: Payer: Self-pay

## 2017-07-16 DIAGNOSIS — I69354 Hemiplegia and hemiparesis following cerebral infarction affecting left non-dominant side: Secondary | ICD-10-CM | POA: Diagnosis not present

## 2017-07-16 DIAGNOSIS — R296 Repeated falls: Secondary | ICD-10-CM | POA: Diagnosis not present

## 2017-07-16 DIAGNOSIS — Z794 Long term (current) use of insulin: Secondary | ICD-10-CM | POA: Diagnosis not present

## 2017-07-16 DIAGNOSIS — Z87891 Personal history of nicotine dependence: Secondary | ICD-10-CM | POA: Diagnosis not present

## 2017-07-16 DIAGNOSIS — E1142 Type 2 diabetes mellitus with diabetic polyneuropathy: Secondary | ICD-10-CM | POA: Diagnosis not present

## 2017-07-16 DIAGNOSIS — I509 Heart failure, unspecified: Secondary | ICD-10-CM | POA: Diagnosis not present

## 2017-07-16 DIAGNOSIS — J449 Chronic obstructive pulmonary disease, unspecified: Secondary | ICD-10-CM | POA: Diagnosis not present

## 2017-07-16 DIAGNOSIS — I110 Hypertensive heart disease with heart failure: Secondary | ICD-10-CM | POA: Diagnosis not present

## 2017-07-16 DIAGNOSIS — F250 Schizoaffective disorder, bipolar type: Secondary | ICD-10-CM | POA: Diagnosis not present

## 2017-07-16 NOTE — Patient Outreach (Addendum)
[facility]) Care Management  07/16/2017  [name] November 17, 1950 [id number]  67year old femalereferred to [facility] due topolypharmacy and recent ED visit. [place] services requested for medication management. PMHx includes, but not limited to, anxiety, bipolar disorder, chronic back pain, fibromylagia, hypothyroidism, HTN, HLD, GERD, diverticulosis, IBS, meningioma s/p craniotomy, migraines, pulmonary hypertension, type 2 diabetes with neuropathy, seizures, stroke, and tobacco abuse. Multiple recent ED visits related to headaches, left arm pain, and weakness. Per notes, patient has a home health nurse from [facility] that fills pillbox every 2 weeks.  Incoming call received from [name] this morning.  HIPAA identifiers verified.  Subjective: [name] states that she was returning my call from Friday and also requesting that her [facility] nurse call her, but she couldn't remember her name or find her phone number.  Patient reports that her constipation is improved and that she had a good stool on Thursday and now feels much less gassy and bloated, but still reports some nausea and decreased appetite.  [name] states that she continues to have arm pain from her elbow up and states that she is taking Vicodin or plain Tylenol for the pain.    Patient also reports that she fell on Saturday and had to slide from her bathroom to her bed because she could not reach up to grab onto her walker.  She states that she did not get hurt.  [name] reports that she a bump on her thigh that has grown to the size of a quarter, but it is not hot, tender or red.  She verbalized that she has other similar "spots" on her legs and that one had been removed in the past.  Patient reports that she has no medication issues.    Assessment:   I encouraged [name] to continue to take her Miralax as directed by her gastroenterologist.  Patient has been receiving iron infusions and this may be  contributing to her constipation.   I gave [name] the name of her [facility] RN, [name], and informed her to tell [name] about the bump on her leg and her continued arm pain.   Patient verbalized that she understood.    Plan:  Call [facility] RN, [name], and request that she give [name] a phone call today.  Will also route [name] my note from this encounter.     Follow up with [name] in one week.   Joetta Manners, PharmD Clinical Pharmacist [place] [phone]

## 2017-07-17 ENCOUNTER — Encounter: Payer: Self-pay | Admitting: Cardiovascular Disease

## 2017-07-17 ENCOUNTER — Ambulatory Visit (INDEPENDENT_AMBULATORY_CARE_PROVIDER_SITE_OTHER): Payer: Medicare HMO | Admitting: Cardiovascular Disease

## 2017-07-17 ENCOUNTER — Other Ambulatory Visit: Payer: Self-pay | Admitting: *Deleted

## 2017-07-17 VITALS — BP 146/58 | HR 78 | Ht 61.0 in | Wt 154.0 lb

## 2017-07-17 DIAGNOSIS — I10 Essential (primary) hypertension: Secondary | ICD-10-CM

## 2017-07-17 DIAGNOSIS — R079 Chest pain, unspecified: Secondary | ICD-10-CM | POA: Diagnosis not present

## 2017-07-17 DIAGNOSIS — E785 Hyperlipidemia, unspecified: Secondary | ICD-10-CM

## 2017-07-17 NOTE — Addendum Note (Signed)
Addended by: Barbarann Ehlers A on: 07/17/2017 02:08 PM   Modules accepted: Orders

## 2017-07-17 NOTE — Patient Instructions (Signed)
Your physician recommends that you schedule a follow-up appointment in: to be determined after test    Your physician has requested that you have a lexiscan myoview. For further information please visit [email or web address]. Please follow instruction sheet, as given.      Your physician recommends that you continue on your current medications as directed. Please refer to the Current Medication list given to you today.   If you need a refill on your cardiac medications before your next appointment, please call your pharmacy.      No lab work ordered today     Thank you for choosing [place] !

## 2017-07-17 NOTE — Progress Notes (Signed)
SUBJECTIVE: The patient presents for follow-up of hypertension.  She was last seen in our office in 2017.  She has multiple medical problems.  She reportedly had an abnormal nuclear stress test in 2006 followed by coronary angiogram in 2007 which revealed normal coronaries.  She has a history of microcytic anemia and follows with hematology.  It appears to be due to a combination from iron deficiency and chronic kidney disease.  Additional medical problems include COPD, obstructive sleep apnea, seizure disorder, bipolar disorder, type 2 diabetes mellitus, history of CVA.  She underwent a normal nuclear stress test on 10/28/2015.  Most recent echocardiogram was performed on 02/22/2015 which showed normal left ventricular systolic function and regional wall motion, LVEF 60 to 65%, mild LVH, indeterminate diastolic function, and mild mitral regurgitation.  Pulmonary pressures were moderately elevated, 45 mmHg.  I personally interpreted ECG performed on 05/19/2017 which showed sinus bradycardia, 56 bpm, and nonspecific ST segment abnormalities.  She has episodic chest tightness associated with exertion.  She also complains of exertional dyspnea and wheezing and sweeping the house.  She has some left arm pain but said this pain radiates from her neck.  She underwent a CT of her cervical spine in January which did not demonstrate any acute cervical spine abnormalities.  Labs 06/27/2017: Hemoglobin 9.8, platelets 260, BUN 25, creatinine 1.04.   Review of Systems: As per "subjective", otherwise negative.  Allergies  Allergen Reactions  . Cephalexin Hives  . Iron Nausea And Vomiting  . Milk-Related Compounds Other (See Comments)    Doesn't agree with stomach.   . Penicillins Hives    Has patient had a PCN reaction causing immediate rash, facial/tongue/throat swelling, SOB or lightheadedness with hypotension: Yes Has patient had a PCN reaction causing severe rash involving mucus membranes or  skin necrosis: No Has patient had a PCN reaction that required hospitalization No Has patient had a PCN reaction occurring within the last 10 years: No If all of the above answers are "NO", then may proceed with Cephalosporin use.   [name] Phenazopyridine Hcl Hives          Current Outpatient Medications  Medication Sig Dispense Refill  . ACCU-CHEK SMARTVIEW test strip TEST BLOOD SUGAR FOUR TIMES DAILY AS DIRECTED 350 each 2  . albuterol (PROVENTIL HFA;VENTOLIN HFA) 108 (90 Base) MCG/ACT inhaler Inhale 1-2 puffs every 6 hours as needed for wheezing, shortness of breath 3 Inhaler 3  . ascorbic acid (VITAMIN C) 500 MG tablet Take 500 mg by mouth daily.    [name] aspirin EC 81 MG tablet Take 81 mg by mouth daily.    . Cholecalciferol (VITAMIN D3) 5000 units CAPS Take 1 capsule by mouth daily.    . cloNIDine (CATAPRES) 0.3 MG tablet TAKE 1 TABLET BY MOUTH EVERY EIGHT HOURS. ONCE AT 8AM, 4PM, AND 12 MIDNIGHT. 270 tablet 1  . diclofenac sodium (VOLTAREN) 1 % GEL Apply 2 g topically daily as needed (pain). Rubs "wherever there is pain" when she doesn't take her pain pill.    . dicyclomine (BENTYL) 10 MG capsule Take 1 tab by mouth twice daily as needed for abdominal cramping. 60 capsule 6  . docusate sodium (COLACE) 100 MG capsule Take 100 mg by mouth 2 (two) times daily.    [name] HYDROcodone-acetaminophen (NORCO/VICODIN) 5-325 MG tablet Take 1 pill every 4-6 hours for pain not relieved by your normal pain medicine (Patient taking differently: Take 1 tablet by mouth 2 (two) times daily as needed for  moderate pain. Take 1 pill every 4-6 hours for pain not relieved by your normal pain medicine) 15 tablet 0  . hydroxychloroquine (PLAQUENIL) 200 MG tablet Take 200 mg by mouth daily.     . insulin aspart (NOVOLOG FLEXPEN) 100 UNIT/ML FlexPen INJECT 10 TO 14 UNITS INTO THE SKIN 3 (THREE) TIMES DAILY WITH MEALS. 45 mL 3  . Insulin Glargine (TOUJEO SOLOSTAR) 300 UNIT/ML SOPN Inject 20 Units into the skin at bedtime. 9 pen  3  . Insulin Pen Needle (CAREFINE PEN NEEDLES) 32G X 4 MM MISC Use 4x a day 300 each 3  . lamoTRIgine (LAMICTAL) 100 MG tablet TAKE 1 TABLET BY MOUTH TWICE DAILY. 60 tablet 0  . levothyroxine (SYNTHROID, LEVOTHROID) 50 MCG tablet TAKE 1 TABLET BY MOUTH ONCE DAILY AND 1/2 TABLET ON SUNDAYS. 30 tablet 0  . lidocaine (XYLOCAINE) 2 % jelly Apply to left abdominal wall twice daily. 30 mL 3  . LORazepam (ATIVAN) 0.5 MG tablet Take 1 tablet (0.5 mg total) by mouth 3 (three) times daily. 270 tablet 2  . losartan (COZAAR) 50 MG tablet Take 1 tablet (50 mg total) by mouth daily. 90 tablet 1  . meclizine (ANTIVERT) 25 MG tablet Take 25 mg by mouth 3 (three) times daily as needed for dizziness. 07/10/17 Has medication and reports not taking often,  but says she takes if dizzy.    . metoprolol tartrate (LOPRESSOR) 50 MG tablet Take 1 tablet (50 mg total) by mouth 2 (two) times daily. KEEP OV. 60 tablet 0  . montelukast (SINGULAIR) 10 MG tablet Take 1 tablet (10 mg total) by mouth daily. 90 tablet 1  . Multiple Vitamins-Minerals (HM MULTIVITAMIN ADULT GUMMY PO) Take 1 tablet by mouth daily.    . polyethylene glycol (MIRALAX) packet Take 17 g by mouth daily. 14 each 0  . pregabalin (LYRICA) 75 MG capsule Take 1 capsule (75 mg total) by mouth daily.    . RABEprazole (ACIPHEX) 20 MG tablet Take 1 tablet (20 mg total) by mouth 2 (two) times daily. 60 tablet 3  . RESTASIS 0.05 % ophthalmic emulsion Place 1 drop into both eyes 2 (two) times daily.     . risperiDONE (RISPERDAL) 0.5 MG tablet Take 1 tablet (0.5 mg total) by mouth at bedtime. 90 tablet 2  . rosuvastatin (CRESTOR) 5 MG tablet TAKE 1 TABLET BY MOUTH AT BEDTIME. 90 tablet 0  . sertraline (ZOLOFT) 100 MG tablet Take 1 tablet (100 mg total) by mouth daily. 90 tablet 2  . simethicone (MYLICON) 833 MG chewable tablet Chew 250 mg by mouth daily as needed for flatulence.    . traZODone (DESYREL) 100 MG tablet Take 1 tablet (100 mg total) by mouth at bedtime. 90  tablet 2  . triamcinolone ointment (KENALOG) 0.1 % Apply 1 application topically at bedtime. Apply to elbows at bedtime.     No current facility-administered medications for this visit.     Past Medical History:  Diagnosis Date  . Anemia   . Anxiety    takes Ativan daily  . Arthritis   . Bipolar disorder ([place])    takes Risperdal nightly  . Blood transfusion   . Cancer ([place])    In her gum  . Carpal tunnel syndrome of right wrist 05/23/2011  . Cervical disc disorder with radiculopathy of cervical region 10/31/2012  . Chronic back pain   . Chronic idiopathic constipation   . Colon polyps   . COPD (chronic obstructive pulmonary disease) with chronic bronchitis (  [place]) 09/16/2013   Office Spirometry 10/30/2013-submaximal effort based on appearance of loop and curve. Numbers would fit with severe restriction but her physiologic capability may be better than this. FVC 0.91/44%, and 10.74/45%, FEV1/FVC 0.81, FEF 25-75% 1.43/69%    . Depression    takes Zoloft daily  . Diabetes mellitus    Type II  . Diverticulosis    TCS 9/08 by Dr. Delfin Edis for diarrhea . Bx for micro scopic colitis negative.   . Fibromyalgia   . Frequent falls   . GERD (gastroesophageal reflux disease)    takes Aciphex daily  . Glaucoma    eye drops daily  . Gum symptoms    infection on antibiotic  . Hemiplegia affecting non-dominant side, post-stroke 08/02/2011  . Hyperlipidemia    takes Crestor daily  . Hypertension    takes Amlodipine,Metoprolol,and Clonidine daily  . Hypothyroidism    takes Synthroid daily  . IBS (irritable bowel syndrome)   . Insomnia    takes Trazodone nightly  . 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 Metabolic encephalopathy 7/40/8144  . Migraines    chronic headaches  . Mononeuritis lower limb   . Narcolepsy   . Osteoporosis   . Pancreatitis 2006   due to Depakote with normal EUS   . Schatzki's ring    non critical / EGD with ED 8/2011with RMR  . Seizures ([place])    takes Lamictal  daily.Last seizure 3 yrs ago  . Sleep apnea    on CPAP  . Stroke [facility])    left sided weakness  . Tubular adenoma of colon     Past Surgical History:  Procedure Laterality Date  . ABDOMINAL HYSTERECTOMY  1978  . BACK SURGERY  July 2012  . BACTERIAL OVERGROWTH TEST N/A 05/05/2013   Procedure: BACTERIAL OVERGROWTH TEST;  Surgeon: Daneil Dolin, MD;  [facility];  Service: Endoscopy;  Laterality: N/A;  7:30  . BIOPSY THYROID  2009  . BRAIN SURGERY  11/2011   resection of meningioma  . BREAST REDUCTION SURGERY  1994  . CARDIAC CATHETERIZATION  05/10/2005   normal coronaries, normal LV systolic function and EF (Dr. Jackie Plum)  . CARPAL TUNNEL RELEASE Left 07/22/04   Dr. Aline Brochure  . CATARACT EXTRACTION Bilateral   . CHOLECYSTECTOMY  1984  . COLONOSCOPY N/A 09/25/2012   [id number] diverticulosis.  colonic polyp-removed : tubular adenoma  . CRANIOTOMY  11/23/2011   Procedure: CRANIOTOMY TUMOR EXCISION;  Surgeon: Hosie Spangle, MD;  Location: [place] NEURO ORS;  Service: Neurosurgery;  Laterality: N/A;  Craniotomy for tumor resection  . ESOPHAGOGASTRODUODENOSCOPY  12/29/2010   [place]k-Retained food in the esophagus and stomach, small hiatal hernia, status post Maloney dilation of the esophagus  . ESOPHAGOGASTRODUODENOSCOPY N/A 09/25/2012   [id number] atonic baggy esophagus status post Maloney dilation 62 F. Hiatal hernia  . GIVENS CAPSULE STUDY N/A 01/15/2013   NORMAL.   . IR GENERIC HISTORICAL  03/17/2016   IR RADIOLOGIST EVAL & MGMT 03/17/2016 [facility]-INTERV RAD  . LESION REMOVAL N/A 05/31/2015   Procedure: REMOVAL RIGHT AND LEFT LESIONS OF MANDIBLE;  Surgeon: Diona Browner, DDS;  Location: [place];  Service: Oral Surgery;  Laterality: N/A;  . MALONEY DILATION  12/29/2010   RMR;  . NM MYOCAR PERF WALL MOTION  2006   "relavtiely normal" persantine, mild anterior thinning (breast attenuation artifact), no region of scar/ischemia  . OVARIAN CYST REMOVAL    . RECTOCELE REPAIR N/A 06/29/2015    Procedure: POSTERIOR REPAIR (RECTOCELE);  Surgeon:  Jonnie Kind, MD;  Location: [facility];  Service: Gynecology;  Laterality: N/A;  . REDUCTION MAMMAPLASTY Bilateral   . SPINE SURGERY  09/29/2010   Dr. Rolena Infante  . surgical excision of 3 tumors from right thigh and right buttock  and left upper thigh  2010  . TOOTH EXTRACTION Bilateral 12/14/2014   Procedure: REMOVAL OF BILATERAL MANDIBULAR EXOSTOSES;  Surgeon: Diona Browner, DDS;  Location: [place];  Service: Oral Surgery;  Laterality: Bilateral;  . TRANSTHORACIC ECHOCARDIOGRAM  2010   EF 60-65%, mild conc LVH, grade 1 diastolic dysfunction; mildly calcified MV annulus with mildly thickened leaflets, mildly calcified MR annulus    Social History   Socioeconomic History  . Marital status: Divorced    Spouse name: Not on file  . Number of children: 1  . Years of education: 25  . Highest education level: Not on file  Occupational History  . Occupation: Disabled  Social Needs  . Financial resource strain: Not on file  . Food insecurity:    Worry: Not on file    Inability: Not on file  . Transportation needs:    Medical: Not on file    Non-medical: Not on file  Tobacco Use  . Smoking status: Former Smoker    Packs/day: 0.25    Years: 7.00    Pack years: 1.75    Types: Cigarettes    Last attempt to quit: 03/13/2017    Years since quitting: 0.3  . Smokeless tobacco: Never Used  . Tobacco comment: continues to smoke - a little less than a 1/4 pack per da as of 05/01/2017  Substance and Sexual Activity  . Alcohol use: No    Alcohol/week: 0.0 oz    Comment:    . Drug use: No  . Sexual activity: Not Currently  Lifestyle  . Physical activity:    Days per week: Not on file    Minutes per session: Not on file  . Stress: Not on file  Relationships  . Social connections:    Talks on phone: Not on file    Gets together: Not on file    Attends religious service: Not on file    Active member of club or organization: Not on file    Attends  meetings of clubs or organizations: Not on file    Relationship status: Not on file  . Intimate partner violence:    Fear of current or ex partner: Not on file    Emotionally abused: Not on file    Physically abused: Not on file    Forced sexual activity: Not on file  Other Topics Concern  . Not on file  Social History Narrative   Lives alone   Caffeine use: Drink coffee sometimes    Right handed      Vitals:   07/17/17 1331 07/17/17 1333  BP: (!) 152/70 (!) 146/58  Pulse: 78   SpO2: 98%   Weight: 154 lb (69.9 kg)   Height: 5\' 1"  (1.549 m)     Wt Readings from Last 3 Encounters:  07/17/17 154 lb (69.9 kg)  07/06/17 157 lb (71.2 kg)  06/27/17 153 lb (69.4 kg)     PHYSICAL EXAM General: NAD HEENT: Normal. Neck: No JVD, no thyromegaly. Lungs: Clear to auscultation bilaterally with normal respiratory effort. CV: Regular rate and rhythm, normal S1/S2, no S3/S4, no murmur. No pretibial or periankle edema.  No carotid bruit.   Abdomen: Soft, nontender, no distention.  Neurologic: Alert and oriented.  Psych:  Normal affect. Skin: Normal. Musculoskeletal: No gross deformities.    ECG: Most recent ECG reviewed.   Labs: Lab Results  Component Value Date/Time   K 3.9 06/27/2017 10:47 PM   BUN 24 (H) 06/27/2017 10:47 PM   BUN 11 02/05/2017 11:42 AM   CREATININE 1.09 (H) 06/27/2017 10:47 PM   CREATININE 0.90 07/19/2015 09:25 AM   ALT 32 06/27/2017 10:47 PM   TSH 0.827 10/15/2016 08:31 PM   TSH 0.906 01/22/2016 09:31 AM   HGB 9.8 (L) 06/27/2017 10:47 PM   HGB 10.5 (L) 09/27/2016 10:47 AM     Lipids: Lab Results  Component Value Date/Time   LDLCALC 87 02/05/2017 11:42 AM   CHOL 175 02/05/2017 11:42 AM   TRIG 105 02/05/2017 11:42 AM   HDL 67 02/05/2017 11:42 AM       ASSESSMENT AND PLAN: 1.  Chest pain: Symptoms appear to occur both with and without exertion.  She also has some exertional dyspnea but has noted COPD.  She underwent a normal coronary angiogram  in 2007 and underwent a normal nuclear stress test in 2017.  She does have multiple cardiovascular risk factors. I will proceed with a nuclear myocardial perfusion imaging study to evaluate for ischemic heart disease (Lexiscan Myoview).  2.  Hypertension: Blood pressure is presently elevated.  She is on clonidine, metoprolol, and losartan.  It was normal at her PCPs office in March of this year.  If it remains persistently elevated, I would consider increasing losartan to 75 mg daily.  3.  Hyperlipidemia: Continue Crestor 5 mg daily.   Disposition: Follow up to be determined  Time spent: 40 minutes, of which greater than 50% was spent reviewing symptoms, relevant blood tests and studies, and discussing management plan with the patient.    Kate Sable, M.D., F.A.C.C.

## 2017-07-17 NOTE — Patient Outreach (Signed)
[facility] [facility]) Care Management  07/17/2017  [name] [birth year] [id number]   Outreach Attempt:  Received notification from [facility] Pharmacist patient has been trying to reach [employer].  Attempted telephone outreach to patient.  Patient stated she was on her way to physician's appointment and requested a telephone call back.  Plan:  RN Health Coach will make another telephone outreach attempt to patient within the next 3 business days.  [place] [phone] [email or web address]

## 2017-07-17 NOTE — Telephone Encounter (Signed)
noted 

## 2017-07-18 ENCOUNTER — Encounter: Payer: Self-pay | Admitting: *Deleted

## 2017-07-18 ENCOUNTER — Other Ambulatory Visit: Payer: Self-pay | Admitting: *Deleted

## 2017-07-18 NOTE — Patient Outreach (Signed)
[facility]) Care Management  07/18/2017  [name] 01/31/1951 [id number]   Outreach Attempt:  Successful telephone outreach to patient for question regarding "bump on her thigh".  HIPAA verified with patient.  Patient stating she has a bump on her leg that has been there for a while.  Reports [name] has seen the bump but the bump is now larger.  States the bump is not open and denies any pain or drainage.  Patient wanting to know if she should go get the bump looked at emergently.  Asked if she showed the bump to the physician/cardiologist she saw yesterday, and patient stated no.  Encouraged patient to call Dr. Griffin Dakin office and inform them the bump is bigger but not open and to verify with them if she needs to be seen sooner than her scheduled appointment on May 29.  Patient also requesting information on Advance Directives.  Patient also reporting fall while attempting to bath without the assistance of her aide.  Falls precautions reviewed and discussed with patient.  Patient encouraged only to bath with the assistance of her aide.  Plan:  Patient will contact Dr. Griffin Dakin office to verify if she needs sooner appointment than already scheduled appointment with week.  Patient encouraged to keep and attend scheduled appointment with therapist and Cardiologist.  [place] will make monthly telephone outreach to patient in the month of May.  RN Health Coach will send patient Emergency planning/management officer.  [place] [phone] [email or web address]

## 2017-07-19 DIAGNOSIS — R296 Repeated falls: Secondary | ICD-10-CM | POA: Diagnosis not present

## 2017-07-19 DIAGNOSIS — E1142 Type 2 diabetes mellitus with diabetic polyneuropathy: Secondary | ICD-10-CM | POA: Diagnosis not present

## 2017-07-19 DIAGNOSIS — I110 Hypertensive heart disease with heart failure: Secondary | ICD-10-CM | POA: Diagnosis not present

## 2017-07-19 DIAGNOSIS — I509 Heart failure, unspecified: Secondary | ICD-10-CM | POA: Diagnosis not present

## 2017-07-19 DIAGNOSIS — I69354 Hemiplegia and hemiparesis following cerebral infarction affecting left non-dominant side: Secondary | ICD-10-CM | POA: Diagnosis not present

## 2017-07-19 DIAGNOSIS — F250 Schizoaffective disorder, bipolar type: Secondary | ICD-10-CM | POA: Diagnosis not present

## 2017-07-19 DIAGNOSIS — Z794 Long term (current) use of insulin: Secondary | ICD-10-CM | POA: Diagnosis not present

## 2017-07-19 DIAGNOSIS — Z87891 Personal history of nicotine dependence: Secondary | ICD-10-CM | POA: Diagnosis not present

## 2017-07-19 DIAGNOSIS — J449 Chronic obstructive pulmonary disease, unspecified: Secondary | ICD-10-CM | POA: Diagnosis not present

## 2017-07-23 ENCOUNTER — Other Ambulatory Visit: Payer: Self-pay

## 2017-07-23 ENCOUNTER — Telehealth: Payer: Self-pay | Admitting: Neurology

## 2017-07-23 DIAGNOSIS — I110 Hypertensive heart disease with heart failure: Secondary | ICD-10-CM | POA: Diagnosis not present

## 2017-07-23 DIAGNOSIS — E1142 Type 2 diabetes mellitus with diabetic polyneuropathy: Secondary | ICD-10-CM | POA: Diagnosis not present

## 2017-07-23 DIAGNOSIS — I69354 Hemiplegia and hemiparesis following cerebral infarction affecting left non-dominant side: Secondary | ICD-10-CM | POA: Diagnosis not present

## 2017-07-23 DIAGNOSIS — F250 Schizoaffective disorder, bipolar type: Secondary | ICD-10-CM | POA: Diagnosis not present

## 2017-07-23 DIAGNOSIS — R296 Repeated falls: Secondary | ICD-10-CM

## 2017-07-23 DIAGNOSIS — Z87891 Personal history of nicotine dependence: Secondary | ICD-10-CM | POA: Diagnosis not present

## 2017-07-23 DIAGNOSIS — J449 Chronic obstructive pulmonary disease, unspecified: Secondary | ICD-10-CM | POA: Diagnosis not present

## 2017-07-23 DIAGNOSIS — Z794 Long term (current) use of insulin: Secondary | ICD-10-CM | POA: Diagnosis not present

## 2017-07-23 DIAGNOSIS — I509 Heart failure, unspecified: Secondary | ICD-10-CM | POA: Diagnosis not present

## 2017-07-23 MED ORDER — UNABLE TO FIND: 0 refills | Status: DC

## 2017-07-23 NOTE — Telephone Encounter (Signed)
Pt called she is having HA's with neck, blurry vision for the past 3 mths. States she has taken pain med to help ease the pain and tylenol with no relief. She has seen Othro had a cortizone injection between right side of neck and shoulder about 6 weeks ago with a little relief. Ortho suggested she follow up with Dr Viona Gilmore.  Appt was offered for tomorrow but he has to give transportation 3 day notice. Please call to advise

## 2017-07-23 NOTE — Patient Outreach (Signed)
[place] [facility]) Care Management  07/23/2017  [name] 1950-09-06 [id number]  3year old femalereferred to [place] Management due topolypharmacy and recent ED visit. [place] services requested for medication management. PMHx includes, but not limited to, anxiety, bipolar disorder, chronic back pain, fibromylagia, hypothyroidism, HTN, HLD, GERD, diverticulosis, IBS, meningioma s/p craniotomy, migraines, pulmonary hypertension, type 2 diabetes with neuropathy, seizures, stroke, and tobacco abuse. Multiple recent ED visits related to headaches, left arm pain, and weakness. Per notes, patient has a home health nurse from [facility] that fills pillbox every 2 weeks.  Successful outreach call placed to [name].  HIPAA identifiers verified.  Subjective: Patient states that her constipation is improved. She states that it was a little hard this morning, but she states that she had missed some doses of her Miralax.  Reports shegets off her sleep schedule due to her neck/back/arm pain that keeps her up at night.  She reports that she has had vision changes, dizziness and headache for the past week and has a neurology appointment scheduled for 5/17.  Reports her glucose as 175 this am and running 134-144.  Patient reports that she has no medication issues at this time.  Assessment: Patient with neurology appointment this week.  Her new complaints of vision changes, dizziness and headache do not seem to correlate with hypo/hyperglycemia or blood pressure as patient monitors daily.   She has not started any new medications.  I informed patient that I will close her [place] case at this time, as she has no current medication issues.  She has my card and is aware that she can call me should any medication needs arise.  Plan: Close [place] case and route discipline closure letter to PCP, [name].    Notify [facility] RN, [name], of pharmacy case closure.    Joetta Manners, PharmD Clinical Pharmacist [place] [phone]

## 2017-07-23 NOTE — Telephone Encounter (Signed)
Called pt. Scheduled appt for 07/26/17 at 4pm, check in 330pm. Pt verbalized understanding and will call back if any issues w/ scheduling transportation and cannot keep appt.

## 2017-07-23 NOTE — Telephone Encounter (Signed)
rx printed for dr to sign and append office note

## 2017-07-23 NOTE — Telephone Encounter (Signed)
Pt is calling in regarding Lift Chair

## 2017-07-25 DIAGNOSIS — I509 Heart failure, unspecified: Secondary | ICD-10-CM | POA: Diagnosis not present

## 2017-07-25 DIAGNOSIS — G894 Chronic pain syndrome: Secondary | ICD-10-CM | POA: Insufficient documentation

## 2017-07-25 DIAGNOSIS — R296 Repeated falls: Secondary | ICD-10-CM | POA: Diagnosis not present

## 2017-07-25 DIAGNOSIS — G8929 Other chronic pain: Secondary | ICD-10-CM | POA: Insufficient documentation

## 2017-07-25 DIAGNOSIS — M542 Cervicalgia: Secondary | ICD-10-CM | POA: Diagnosis not present

## 2017-07-25 DIAGNOSIS — J449 Chronic obstructive pulmonary disease, unspecified: Secondary | ICD-10-CM | POA: Diagnosis not present

## 2017-07-25 DIAGNOSIS — M25512 Pain in left shoulder: Secondary | ICD-10-CM | POA: Diagnosis not present

## 2017-07-25 DIAGNOSIS — I110 Hypertensive heart disease with heart failure: Secondary | ICD-10-CM | POA: Diagnosis not present

## 2017-07-25 DIAGNOSIS — Z79891 Long term (current) use of opiate analgesic: Secondary | ICD-10-CM | POA: Diagnosis not present

## 2017-07-25 DIAGNOSIS — I69354 Hemiplegia and hemiparesis following cerebral infarction affecting left non-dominant side: Secondary | ICD-10-CM | POA: Diagnosis not present

## 2017-07-25 DIAGNOSIS — Z87891 Personal history of nicotine dependence: Secondary | ICD-10-CM | POA: Diagnosis not present

## 2017-07-25 DIAGNOSIS — F250 Schizoaffective disorder, bipolar type: Secondary | ICD-10-CM | POA: Diagnosis not present

## 2017-07-25 DIAGNOSIS — M5030 Other cervical disc degeneration, unspecified cervical region: Secondary | ICD-10-CM | POA: Diagnosis not present

## 2017-07-25 DIAGNOSIS — Z794 Long term (current) use of insulin: Secondary | ICD-10-CM | POA: Diagnosis not present

## 2017-07-25 DIAGNOSIS — E1142 Type 2 diabetes mellitus with diabetic polyneuropathy: Secondary | ICD-10-CM | POA: Diagnosis not present

## 2017-07-26 ENCOUNTER — Other Ambulatory Visit: Payer: Self-pay | Admitting: Family Medicine

## 2017-07-26 ENCOUNTER — Other Ambulatory Visit: Payer: Self-pay

## 2017-07-26 ENCOUNTER — Ambulatory Visit (INDEPENDENT_AMBULATORY_CARE_PROVIDER_SITE_OTHER): Payer: Medicare HMO | Admitting: Neurology

## 2017-07-26 ENCOUNTER — Encounter: Payer: Self-pay | Admitting: Neurology

## 2017-07-26 ENCOUNTER — Other Ambulatory Visit: Payer: Self-pay | Admitting: Internal Medicine

## 2017-07-26 ENCOUNTER — Other Ambulatory Visit (HOSPITAL_COMMUNITY): Payer: Self-pay | Admitting: Psychiatry

## 2017-07-26 VITALS — BP 198/80 | HR 71 | Ht 61.0 in | Wt 154.0 lb

## 2017-07-26 DIAGNOSIS — R6889 Other general symptoms and signs: Secondary | ICD-10-CM | POA: Diagnosis not present

## 2017-07-26 DIAGNOSIS — G40909 Epilepsy, unspecified, not intractable, without status epilepticus: Secondary | ICD-10-CM | POA: Diagnosis not present

## 2017-07-26 MED ORDER — LAMOTRIGINE 100 MG PO TABS: 100.0000 mg | ORAL_TABLET | Freq: Two times a day (BID) | ORAL | 3 refills | Status: DC

## 2017-07-26 NOTE — Progress Notes (Signed)
Reason for visit: Seizures  [name] is an 67 y.o. female  History of present illness:  [name] is a 57 year old right-handed black female with a history of seizures following resection of a meningioma.  The patient has been seen by Dr. Sherwood Gambler in the past.  The patient is followed by Dr. Nelva Bush for ongoing chronic neck and low back pain, the patient has pain into the left shoulder, some discomfort going up the back of the head and on the top of the head, with cervicogenic headache.  The patient is getting some injections in the neck and shoulder, she is on chronic opioid therapy.  She is on a multitude of various medications.  She sees psychiatry for bipolar disorder.  She comes into the office today for an evaluation.  She uses a walker for ambulation, she will fall on occasion.  She has fallen 3 times since last seen.  She has not had any recurrent seizures, she does not operate a motor vehicle.  Past Medical History:  Diagnosis Date  . Anemia   . Anxiety    takes Ativan daily  . Arthritis   . Bipolar disorder ([place])    takes Risperdal nightly  . Blood transfusion   . Cancer ([place])    In her gum  . Carpal tunnel syndrome of right wrist 05/23/2011  . Cervical disc disorder with radiculopathy of cervical region 10/31/2012  . Chronic back pain   . Chronic idiopathic constipation   . Colon polyps   . COPD (chronic obstructive pulmonary disease) with chronic bronchitis ([place]) 09/16/2013   Office Spirometry 10/30/2013-submaximal effort based on appearance of loop and curve. Numbers would fit with severe restriction but her physiologic capability may be better than this. FVC 0.91/44%, and 10.74/45%, FEV1/FVC 0.81, FEF 25-75% 1.43/69%    . Depression    takes Zoloft daily  . Diabetes mellitus    Type II  . Diverticulosis    TCS 9/08 by Dr. Delfin Edis for diarrhea . Bx for micro scopic colitis negative.   . Fibromyalgia   . Frequent falls   . GERD (gastroesophageal reflux disease)      takes Aciphex daily  . Glaucoma    eye drops daily  . Gum symptoms    infection on antibiotic  . Hemiplegia affecting non-dominant side, post-stroke 08/02/2011  . Hyperlipidemia    takes Crestor daily  . Hypertension    takes Amlodipine,Metoprolol,and Clonidine daily  . Hypothyroidism    takes Synthroid daily  . IBS (irritable bowel syndrome)   . Insomnia    takes Trazodone nightly  . 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 Metabolic encephalopathy 5/42/7062  . Migraines    chronic headaches  . Mononeuritis lower limb   . Narcolepsy   . Osteoporosis   . Pancreatitis 2006   due to Depakote with normal EUS   . Schatzki's ring    non critical / EGD with ED 8/2011with RMR  . Seizures ([place])    takes Lamictal daily.Last seizure 3 yrs ago  . Sleep apnea    on CPAP  . Stroke [name])    left sided weakness  . Tubular adenoma of colon     Past Surgical History:  Procedure Laterality Date  . ABDOMINAL HYSTERECTOMY  1978  . BACK SURGERY  July 2012  . BACTERIAL OVERGROWTH TEST N/A 05/05/2013   Procedure: BACTERIAL OVERGROWTH TEST;  Surgeon: Daneil Dolin, MD;  [facility];  Service: Endoscopy;  Laterality: N/A;  7:[place] THYROID  2009  . BRAIN SURGERY  11/2011   resection of meningioma  . BREAST REDUCTION SURGERY  1994  . CARDIAC CATHETERIZATION  05/10/2005   normal coronaries, normal LV systolic function and EF (Dr. Jackie Plum)  . CARPAL TUNNEL RELEASE Left 07/22/04   Dr. Aline Brochure  . CATARACT EXTRACTION Bilateral   . CHOLECYSTECTOMY  1984  . COLONOSCOPY N/A 09/25/2012   ONG:EXBMWUX diverticulosis.  colonic polyp-removed : tubular adenoma  . CRANIOTOMY  11/23/2011   Procedure: CRANIOTOMY TUMOR EXCISION;  Surgeon: Hosie Spangle, MD;  Location: [place];  Service: Neurosurgery;  Laterality: N/A;  Craniotomy for tumor resection  . ESOPHAGOGASTRODUODENOSCOPY  12/29/2010   [place]-Retained food in the esophagus and stomach, small hiatal hernia, status post Maloney  dilation of the esophagus  . ESOPHAGOGASTRODUODENOSCOPY N/A 09/25/2012   LKG:[id number] atonic baggy esophagus status post Maloney dilation 5 F. Hiatal hernia  . GIVENS CAPSULE STUDY N/A 01/15/2013   NORMAL.   . IR GENERIC HISTORICAL  03/17/2016   IR RADIOLOGIST EVAL & MGMT 03/17/2016 [facility]-INTER[facility] RAD  . LESION REMOVAL N/A 05/31/2015   Procedure: REMOVAL RIGHT AND LEFT LESIONS OF MANDIBLE;  Surgeon: Diona Browner, DDS;  Location: [place];  Service: Oral Surgery;  Laterality: N/A;  . MALONEY DILATION  12/29/2010   RMR;  . NM MYOCAR PERF WALL MOTION  2006   "relavtiely normal" persantine, mild anterior thinning (breast attenuation artifact), no region of scar/ischemia  . OVARIAN CYST REMOVAL    . RECTOCELE REPAIR N/A 06/29/2015   Procedure: POSTERIOR REPAIR (RECTOCELE);  Surgeon: Jonnie Kind, MD;  Location: [facility];  Service: Gynecology;  Laterality: N/A;  . REDUCTION MAMMAPLASTY Bilateral   . SPINE SURGERY  09/29/2010   Dr. Rolena Infante  . surgical excision of 3 tumors from right thigh and right buttock  and left upper thigh  2010  . TOOTH EXTRACTION Bilateral 12/14/2014   Procedure: REMOVAL OF BILATERAL MANDIBULAR EXOSTOSES;  Surgeon: Diona Browner, DDS;  Location: [facility];  Service: Oral Surgery;  Laterality: Bilateral;  . TRANSTHORACIC ECHOCARDIOGRAM  2010   EF 60-65%, mild conc LVH, grade 1 diastolic dysfunction; mildly calcified MV annulus with mildly thickened leaflets, mildly calcified MR annulus    Family History  Problem Relation Age of Onset  . Heart attack Mother        HTN  . Pneumonia Father   . Kidney failure Father   . Diabetes Father   . Pancreatic cancer Sister   . Diabetes Brother   . Hypertension Brother   . Diabetes Brother   . Cancer Sister        breast   . Hypertension Son   . Sleep apnea Son   . Cancer Sister        pancreatic  . Stroke Maternal Grandmother   . Heart attack Maternal Grandfather   . Alcohol abuse Maternal Uncle   . Colon cancer Neg Hx   . Anesthesia  problems Neg Hx   . Hypotension Neg Hx   . Malignant hyperthermia Neg Hx   . Pseudochol deficiency Neg Hx     Social history:  reports that she quit smoking about 4 months ago. Her smoking use included cigarettes. She has a 1.75 pack-year smoking history. She has never used smokeless tobacco. She reports that she does not drink alcohol or use drugs.    Allergies  Allergen Reactions  . Cephalexin Hives  . Iron Nausea And Vomiting  . Milk-Related Compounds Other (See  Comments)    Doesn't agree with stomach.   . Penicillins Hives    Has patient had a PCN reaction causing immediate rash, facial/tongue/throat swelling, SOB or lightheadedness with hypotension: Yes Has patient had a PCN reaction causing severe rash involving mucus membranes or skin necrosis: No Has patient had a PCN reaction that required hospitalization No Has patient had a PCN reaction occurring within the last 10 years: No If all of the above answers are "NO", then may proceed with Cephalosporin use.   [name] Phenazopyridine Hcl Hives          Medications:  Prior to Admission medications   Medication Sig Start Date End Date Taking? Authorizing Provider  ACCU-CHEK SMARTVIEW test strip TEST BLOOD SUGAR FOUR TIMES DAILY AS DIRECTED 05/08/17  Yes Philemon Kingdom, MD  albuterol (PROVENTIL HFA;VENTOLIN HFA) 108 (90 Base) MCG/ACT inhaler Inhale 1-2 puffs every 6 hours as needed for wheezing, shortness of breath 07/27/16  Yes Young, Tarri Fuller D, MD  ascorbic acid (VITAMIN C) 500 MG tablet Take 500 mg by mouth daily.   Yes [provider]  aspirin EC 81 MG tablet Take 81 mg by mouth daily.   Yes [provider]  Cholecalciferol (VITAMIN D3) 5000 units CAPS Take 1 capsule by mouth daily.   Yes [provider]  cloNIDine (CATAPRES) 0.3 MG tablet TAKE 1 TABLET BY MOUTH EVERY EIGHT HOURS. ONCE AT 8AM, 4PM, AND 12 MIDNIGHT. 05/23/17  Yes [name], MD  diclofenac sodium (VOLTAREN) 1 % GEL Apply 2 g  topically daily as needed (pain). Rubs "wherever there is pain" when she doesn't take her pain pill.   Yes [provider]  dicyclomine (BENTYL) 10 MG capsule Take 1 tab by mouth twice daily as needed for abdominal cramping. 07/06/17  Yes Esterwood, Amy S, PA-C  docusate sodium (COLACE) 100 MG capsule Take 100 mg by mouth 2 (two) times daily.   Yes [provider]  HYDROcodone-acetaminophen (NORCO/VICODIN) 5-325 MG tablet Take 1 pill every 4-6 hours for pain not relieved by your normal pain medicine Patient taking differently: Take 1 tablet by mouth 2 (two) times daily as needed for moderate pain. Take 1 pill every 4-6 hours for pain not relieved by your normal pain medicine 05/31/17  Yes Milton Ferguson, MD  hydroxychloroquine (PLAQUENIL) 200 MG tablet Take 200 mg by mouth daily.    Yes [provider]  insulin aspart (NOVOLOG FLEXPEN) 100 UNIT/ML FlexPen INJECT 10 TO 14 UNITS INTO THE SKIN 3 (THREE) TIMES DAILY WITH MEALS. 06/07/17  Yes Philemon Kingdom, MD  Insulin Glargine (TOUJEO SOLOSTAR) 300 UNIT/ML SOPN Inject 20 Units into the skin at bedtime. 06/07/17  Yes Philemon Kingdom, MD  Insulin Pen Needle (CAREFINE PEN NEEDLES) 32G X 4 MM MISC Use 4x a day 06/07/17  Yes Philemon Kingdom, MD  lamoTRIgine (LAMICTAL) 100 MG tablet Take 1 tablet (100 mg total) by mouth 2 (two) times daily. 07/26/17  Yes Kathrynn Ducking, MD  levothyroxine (SYNTHROID, LEVOTHROID) 50 MCG tablet TAKE 1 TABLET BY MOUTH ONCE DAILY AND 1/2 TABLET ON SUNDAYS. 07/09/17  Yes [name], MD  lidocaine (XYLOCAINE) 2 % jelly Apply to left abdominal wall twice daily. 07/06/17  Yes Esterwood, Amy S, PA-C  LORazepam (ATIVAN) 0.5 MG tablet Take 1 tablet (0.5 mg total) by mouth 3 (three) times daily. 04/30/17 04/30/18 Yes Cloria Spring, MD  losartan (COZAAR) 50 MG tablet Take 1 tablet (50 mg total) by mouth daily. 05/23/17  Yes [name],  MD  meclizine (ANTIVERT) 25 MG tablet Take 25 mg by mouth 3  (three) times daily as needed for dizziness. 07/10/17 Has medication and reports not taking often,  but says she takes if dizzy.   Yes [provider]  metoprolol tartrate (LOPRESSOR) 50 MG tablet TAKE 1 TABLET BY MOUTH TWICE DAILY. 07/26/17  Yes Hilty, Nadean Corwin, MD  montelukast (SINGULAIR) 10 MG tablet Take 1 tablet (10 mg total) by mouth daily. 05/23/17  Yes [name], MD  Multiple Vitamins-Minerals (HM MULTIVITAMIN ADULT GUMMY PO) Take 1 tablet by mouth daily.   Yes [provider]  oxyCODONE-acetaminophen (PERCOCET/ROXICET) 5-325 MG tablet Take by mouth as needed. 07/25/17  Yes [provider]  polyethylene glycol (MIRALAX) packet Take 17 g by mouth daily. 06/28/17  Yes Idol, Almyra Free, PA-C  pregabalin (LYRICA) 75 MG capsule Take 1 capsule (75 mg total) by mouth daily. 10/20/16  Yes Rexene Alberts, MD  RABEprazole (ACIPHEX) 20 MG tablet Take 1 tablet (20 mg total) by mouth 2 (two) times daily. 07/06/17  Yes Esterwood, Amy S, PA-C  RESTASIS 0.05 % ophthalmic emulsion Place 1 drop into both eyes 2 (two) times daily.  02/04/14  Yes [provider]  risperiDONE (RISPERDAL) 0.5 MG tablet Take 1 tablet (0.5 mg total) by mouth at bedtime. 04/30/17  Yes Cloria Spring, MD  rosuvastatin (CRESTOR) 5 MG tablet TAKE 1 TABLET BY MOUTH AT BEDTIME. 07/09/17  Yes [name], MD  sertraline (ZOLOFT) 100 MG tablet Take 1 tablet (100 mg total) by mouth daily. 04/30/17  Yes Cloria Spring, MD  simethicone (MYLICON) 341 MG chewable tablet Chew 250 mg by mouth daily as needed for flatulence.   Yes [provider]  traZODone (DESYREL) 100 MG tablet Take 1 tablet (100 mg total) by mouth at bedtime. 04/30/17  Yes Cloria Spring, MD  triamcinolone ointment (KENALOG) 0.1 % Apply 1 application topically at bedtime. Apply to elbows at bedtime.   Yes [provider]  UNABLE TO FIND Lift chair x 1  DX 07/23/17  Yes [name], MD    ROS:  Out of a  complete 14 system review of symptoms, the patient complains only of the following symptoms, and all other reviewed systems are negative.  Ringing in the ears, runny nose Light sensitivity Wheezing, shortness of breath, chest tightness Swollen abdomen, abdominal pain, constipation, vomiting Sleep apnea, snoring Food allergies, frequent infections Anemia Dizziness, headache, seizures Confusion, depression  Blood pressure (!) 198/80, pulse 71, height 5\' 1"  (1.549 m), weight 154 lb (69.9 kg), SpO2 98 %.  Physical Exam  General: The patient is alert and cooperative at the time of the examination.  The patient is moderately obese.  Neuromuscular: The patient lacks about 30 degrees of full lateral rotation to the left, she has full rotation of the cervical spine to the right.  Skin: No significant peripheral edema is noted.   Neurologic Exam  Mental status: The patient is alert and oriented x 3 at the time of the examination. The patient has apparent normal recent and remote memory, with an apparently normal attention span and concentration ability.   Cranial nerves: Facial symmetry is present. Speech is normal, no aphasia or dysarthria is noted. Extraocular movements are full. Visual fields are full.  Motor: The patient has good strength in all 4 extremities.  Sensory examination: Soft touch sensation is symmetric on the face, arms, and legs.  Coordination: The patient has good finger-nose-finger and heel-to-shin bilaterally.  Gait and station: The patient has a slightly wide-based gait, the patient is able to ambulate with a walker.  Reflexes: Deep tendon reflexes are symmetric, but are depressed.   Assessment/Plan:  1.  History of seizures, well controlled  2.  Bifrontal encephalomalacia, meningioma resection  3.  Chronic neck pain, cervicogenic headache  The patient will continue the Lamictal, a prescription was sent in.  She will follow-up in 6 months, she will call if  any concerns arise.  Jill Alexanders MD 07/26/2017 4:22 PM  [facility] [address] [place], [place] [address]  Phone [phone] Fax [phone]

## 2017-07-27 ENCOUNTER — Other Ambulatory Visit (HOSPITAL_COMMUNITY): Payer: Self-pay

## 2017-07-27 DIAGNOSIS — D509 Iron deficiency anemia, unspecified: Secondary | ICD-10-CM

## 2017-07-30 ENCOUNTER — Encounter (HOSPITAL_COMMUNITY): Payer: Self-pay | Admitting: Psychiatry

## 2017-07-30 ENCOUNTER — Other Ambulatory Visit: Payer: Self-pay

## 2017-07-30 ENCOUNTER — Ambulatory Visit (INDEPENDENT_AMBULATORY_CARE_PROVIDER_SITE_OTHER): Payer: Medicare HMO | Admitting: Psychiatry

## 2017-07-30 VITALS — BP 190/68 | HR 75 | Ht 61.0 in | Wt 154.0 lb

## 2017-07-30 DIAGNOSIS — F419 Anxiety disorder, unspecified: Secondary | ICD-10-CM | POA: Diagnosis not present

## 2017-07-30 DIAGNOSIS — Z811 Family history of alcohol abuse and dependence: Secondary | ICD-10-CM | POA: Diagnosis not present

## 2017-07-30 DIAGNOSIS — M549 Dorsalgia, unspecified: Secondary | ICD-10-CM

## 2017-07-30 DIAGNOSIS — F331 Major depressive disorder, recurrent, moderate: Secondary | ICD-10-CM

## 2017-07-30 DIAGNOSIS — J449 Chronic obstructive pulmonary disease, unspecified: Secondary | ICD-10-CM | POA: Diagnosis not present

## 2017-07-30 DIAGNOSIS — I110 Hypertensive heart disease with heart failure: Secondary | ICD-10-CM | POA: Diagnosis not present

## 2017-07-30 DIAGNOSIS — M2550 Pain in unspecified joint: Secondary | ICD-10-CM | POA: Diagnosis not present

## 2017-07-30 DIAGNOSIS — F250 Schizoaffective disorder, bipolar type: Secondary | ICD-10-CM | POA: Diagnosis not present

## 2017-07-30 DIAGNOSIS — Z736 Limitation of activities due to disability: Secondary | ICD-10-CM

## 2017-07-30 DIAGNOSIS — R450 Nervousness: Secondary | ICD-10-CM | POA: Diagnosis not present

## 2017-07-30 DIAGNOSIS — I509 Heart failure, unspecified: Secondary | ICD-10-CM | POA: Diagnosis not present

## 2017-07-30 DIAGNOSIS — R51 Headache: Secondary | ICD-10-CM

## 2017-07-30 DIAGNOSIS — Z794 Long term (current) use of insulin: Secondary | ICD-10-CM | POA: Diagnosis not present

## 2017-07-30 DIAGNOSIS — R296 Repeated falls: Secondary | ICD-10-CM | POA: Diagnosis not present

## 2017-07-30 DIAGNOSIS — Z87891 Personal history of nicotine dependence: Secondary | ICD-10-CM | POA: Diagnosis not present

## 2017-07-30 DIAGNOSIS — E1142 Type 2 diabetes mellitus with diabetic polyneuropathy: Secondary | ICD-10-CM | POA: Diagnosis not present

## 2017-07-30 DIAGNOSIS — I69354 Hemiplegia and hemiparesis following cerebral infarction affecting left non-dominant side: Secondary | ICD-10-CM | POA: Diagnosis not present

## 2017-07-30 MED ORDER — TRAZODONE HCL 100 MG PO TABS: 100.0000 mg | ORAL_TABLET | Freq: Every day | ORAL | 2 refills | Status: DC

## 2017-07-30 MED ORDER — SERTRALINE HCL 100 MG PO TABS: 100.0000 mg | ORAL_TABLET | Freq: Every day | ORAL | 2 refills | Status: DC

## 2017-07-30 MED ORDER — RISPERIDONE 0.5 MG PO TABS: 0.5000 mg | ORAL_TABLET | Freq: Every day | ORAL | 2 refills | Status: DC

## 2017-07-30 MED ORDER — CETIRIZINE HCL 10 MG PO TABS: 10.0000 mg | ORAL_TABLET | Freq: Every day | ORAL | 0 refills | Status: DC

## 2017-07-30 MED ORDER — LORAZEPAM 0.5 MG PO TABS: 0.5000 mg | ORAL_TABLET | Freq: Three times a day (TID) | ORAL | 2 refills | Status: DC
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
## 2017-07-31 ENCOUNTER — Ambulatory Visit (HOSPITAL_COMMUNITY): Payer: Self-pay | Admitting: Internal Medicine

## 2017-07-31 ENCOUNTER — Inpatient Hospital Stay (HOSPITAL_COMMUNITY): Payer: Medicare HMO

## 2017-07-31 DIAGNOSIS — D509 Iron deficiency anemia, unspecified: Secondary | ICD-10-CM

## 2017-07-31 DIAGNOSIS — N189 Chronic kidney disease, unspecified: Secondary | ICD-10-CM | POA: Diagnosis not present

## 2017-07-31 DIAGNOSIS — I509 Heart failure, unspecified: Secondary | ICD-10-CM | POA: Diagnosis not present

## 2017-07-31 DIAGNOSIS — D631 Anemia in chronic kidney disease: Secondary | ICD-10-CM | POA: Diagnosis not present

## 2017-07-31 DIAGNOSIS — I110 Hypertensive heart disease with heart failure: Secondary | ICD-10-CM | POA: Diagnosis not present

## 2017-07-31 DIAGNOSIS — F250 Schizoaffective disorder, bipolar type: Secondary | ICD-10-CM | POA: Diagnosis not present

## 2017-07-31 DIAGNOSIS — M150 Primary generalized (osteo)arthritis: Secondary | ICD-10-CM | POA: Diagnosis not present

## 2017-07-31 DIAGNOSIS — E1142 Type 2 diabetes mellitus with diabetic polyneuropathy: Secondary | ICD-10-CM | POA: Diagnosis not present

## 2017-07-31 DIAGNOSIS — I639 Cerebral infarction, unspecified: Secondary | ICD-10-CM | POA: Diagnosis not present

## 2017-07-31 DIAGNOSIS — R32 Unspecified urinary incontinence: Secondary | ICD-10-CM | POA: Diagnosis not present

## 2017-07-31 DIAGNOSIS — R040 Epistaxis: Secondary | ICD-10-CM | POA: Diagnosis not present

## 2017-07-31 DIAGNOSIS — I69354 Hemiplegia and hemiparesis following cerebral infarction affecting left non-dominant side: Secondary | ICD-10-CM | POA: Diagnosis not present

## 2017-07-31 DIAGNOSIS — Z794 Long term (current) use of insulin: Secondary | ICD-10-CM | POA: Diagnosis not present

## 2017-07-31 DIAGNOSIS — J449 Chronic obstructive pulmonary disease, unspecified: Secondary | ICD-10-CM | POA: Diagnosis not present

## 2017-07-31 DIAGNOSIS — R296 Repeated falls: Secondary | ICD-10-CM | POA: Diagnosis not present

## 2017-07-31 DIAGNOSIS — Z87891 Personal history of nicotine dependence: Secondary | ICD-10-CM | POA: Diagnosis not present

## 2017-07-31 LAB — CBC WITH DIFFERENTIAL/PLATELET
Basophils Absolute: 0 10*3/uL (ref 0.0–0.1)
Basophils Relative: 0 %
Eosinophils Absolute: 0.1 10*3/uL (ref 0.0–0.7)
Eosinophils Relative: 3 %
HCT: 32.4 % — ABNORMAL LOW (ref 36.0–46.0)
HEMOGLOBIN: 9.5 g/dL — AB (ref 12.0–15.0)
LYMPHS ABS: 1.4 10*3/uL (ref 0.7–4.0)
LYMPHS PCT: 29 %
MCH: 21.9 pg — AB (ref 26.0–34.0)
MCHC: 29.3 g/dL — AB (ref 30.0–36.0)
MCV: 74.8 fL — ABNORMAL LOW (ref 78.0–100.0)
MONO ABS: 0.4 10*3/uL (ref 0.1–1.0)
MONOS PCT: 9 %
NEUTROS ABS: 2.8 10*3/uL (ref 1.7–7.7)
Neutrophils Relative %: 59 %
Platelets: 212 10*3/uL (ref 150–400)
RBC: 4.33 MIL/uL (ref 3.87–5.11)
RDW: 18.4 % — ABNORMAL HIGH (ref 11.5–15.5)
WBC: 4.8 10*3/uL (ref 4.0–10.5)

## 2017-07-31 LAB — IRON AND TIBC
Iron: 95 ug/dL (ref 28–170)
Saturation Ratios: 30 % (ref 10.4–31.8)
TIBC: 318 ug/dL (ref 250–450)
UIBC: 223 ug/dL

## 2017-07-31 LAB — RETICULOCYTES
RBC.: 4.33 MIL/uL (ref 3.87–5.11)
Retic Count, Absolute: 95.3 10*3/uL (ref 19.0–186.0)
Retic Ct Pct: 2.2 % (ref 0.4–3.1)

## 2017-07-31 LAB — FOLATE: Folate: 40 ng/mL (ref >5.9)

## 2017-07-31 LAB — LACTATE DEHYDROGENASE: LDH: 220 U/L — AB (ref 98–192)

## 2017-07-31 LAB — VITAMIN B12: VITAMIN B 12: 1508 pg/mL — AB (ref 180–914)

## 2017-07-31 LAB — FERRITIN: Ferritin: 377 ng/mL — ABNORMAL HIGH (ref 11–307)

## 2017-07-31 NOTE — Telephone Encounter (Signed)
I spoke with pt yesterday in the Parking lot and made her aware that even when covered the out of pocket cost for the lift chair is often several hundred $ and we should look at this further at next visit, she agreed . Said she would be coming to collect a handicap parking  sticker which was prepared and ready for her

## 2017-08-01 ENCOUNTER — Ambulatory Visit: Payer: Self-pay

## 2017-08-01 DIAGNOSIS — I509 Heart failure, unspecified: Secondary | ICD-10-CM | POA: Diagnosis not present

## 2017-08-01 DIAGNOSIS — F250 Schizoaffective disorder, bipolar type: Secondary | ICD-10-CM | POA: Diagnosis not present

## 2017-08-01 DIAGNOSIS — R296 Repeated falls: Secondary | ICD-10-CM | POA: Diagnosis not present

## 2017-08-01 DIAGNOSIS — Z794 Long term (current) use of insulin: Secondary | ICD-10-CM | POA: Diagnosis not present

## 2017-08-01 DIAGNOSIS — I69354 Hemiplegia and hemiparesis following cerebral infarction affecting left non-dominant side: Secondary | ICD-10-CM | POA: Diagnosis not present

## 2017-08-01 DIAGNOSIS — Z87891 Personal history of nicotine dependence: Secondary | ICD-10-CM | POA: Diagnosis not present

## 2017-08-01 DIAGNOSIS — M25512 Pain in left shoulder: Secondary | ICD-10-CM | POA: Diagnosis not present

## 2017-08-01 DIAGNOSIS — J449 Chronic obstructive pulmonary disease, unspecified: Secondary | ICD-10-CM | POA: Diagnosis not present

## 2017-08-01 DIAGNOSIS — E1142 Type 2 diabetes mellitus with diabetic polyneuropathy: Secondary | ICD-10-CM | POA: Diagnosis not present

## 2017-08-01 DIAGNOSIS — I110 Hypertensive heart disease with heart failure: Secondary | ICD-10-CM | POA: Diagnosis not present

## 2017-08-02 ENCOUNTER — Encounter (HOSPITAL_BASED_OUTPATIENT_CLINIC_OR_DEPARTMENT_OTHER)
Admission: RE | Admit: 2017-08-02 | Discharge: 2017-08-02 | Disposition: A | Discharge status: Home / Self Care | Payer: Medicare HMO | Source: Ambulatory Visit | Attending: Cardiovascular Disease | Admitting: Cardiovascular Disease

## 2017-08-02 ENCOUNTER — Encounter (HOSPITAL_COMMUNITY)
Admission: RE | Admit: 2017-08-02 | Discharge: 2017-08-02 | Disposition: A | Discharge status: Home / Self Care | Payer: Medicare HMO | Source: Ambulatory Visit | Attending: Cardiovascular Disease | Admitting: Cardiovascular Disease

## 2017-08-02 DIAGNOSIS — R079 Chest pain, unspecified: Secondary | ICD-10-CM | POA: Diagnosis not present

## 2017-08-02 LAB — NM MYOCAR MULTI W/SPECT W/WALL MOTION / EF
CHL CUP NUCLEAR SRS: 1
CHL CUP NUCLEAR SSS: 1
LV sys vol: 19 mL
LVDIAVOL: 67 mL (ref 46–106)
Peak HR: 80 {beats}/min
RATE: 0.36
Rest HR: 57 {beats}/min
SDS: 0
TID: 1.4

## 2017-08-02 LAB — PROTEIN ELECTROPHORESIS, SERUM
A/G RATIO SPE: 1.3 (ref 0.7–1.7)
ALBUMIN ELP: 3.5 g/dL (ref 2.9–4.4)
ALPHA-2-GLOBULIN: 0.7 g/dL (ref 0.4–1.0)
Alpha-1-Globulin: 0.2 g/dL (ref 0.0–0.4)
BETA GLOBULIN: 1 g/dL (ref 0.7–1.3)
GAMMA GLOBULIN: 0.7 g/dL (ref 0.4–1.8)
Globulin, Total: 2.7 g/dL (ref 2.2–3.9)
Total Protein ELP: 6.2 g/dL (ref 6.0–8.5)

## 2017-08-02 MED ORDER — TECHNETIUM TC 99M TETROFOSMIN IV KIT
9.8000 | PACK | Freq: Once | INTRAVENOUS | Status: AC | PRN
  Administered 2017-08-02: 9.8 via INTRAVENOUS

## 2017-08-02 MED ORDER — REGADENOSON 0.4 MG/5ML IV SOLN
INTRAVENOUS | Status: AC
  Administered 2017-08-02: 0.4 mg via INTRAVENOUS
  Filled 2017-08-02: qty 5

## 2017-08-02 MED ORDER — SODIUM CHLORIDE 0.9% FLUSH
INTRAVENOUS | Status: AC
  Administered 2017-08-02: 10 mL via INTRAVENOUS
  Filled 2017-08-02: qty 10

## 2017-08-02 MED ORDER — TECHNETIUM TC 99M TETROFOSMIN IV KIT
29.8000 | PACK | Freq: Once | INTRAVENOUS | Status: AC | PRN
  Administered 2017-08-02: 29.8 via INTRAVENOUS

## 2017-08-05 DIAGNOSIS — G4733 Obstructive sleep apnea (adult) (pediatric): Secondary | ICD-10-CM | POA: Diagnosis not present

## 2017-08-07 ENCOUNTER — Ambulatory Visit: Payer: Self-pay

## 2017-08-07 ENCOUNTER — Other Ambulatory Visit: Payer: Self-pay | Admitting: Family Medicine

## 2017-08-07 ENCOUNTER — Telehealth: Payer: Self-pay | Admitting: Family Medicine

## 2017-08-07 ENCOUNTER — Other Ambulatory Visit: Payer: Self-pay

## 2017-08-07 DIAGNOSIS — Z794 Long term (current) use of insulin: Secondary | ICD-10-CM | POA: Diagnosis not present

## 2017-08-07 DIAGNOSIS — I69354 Hemiplegia and hemiparesis following cerebral infarction affecting left non-dominant side: Secondary | ICD-10-CM | POA: Diagnosis not present

## 2017-08-07 DIAGNOSIS — J449 Chronic obstructive pulmonary disease, unspecified: Secondary | ICD-10-CM | POA: Diagnosis not present

## 2017-08-07 DIAGNOSIS — Z87891 Personal history of nicotine dependence: Secondary | ICD-10-CM | POA: Diagnosis not present

## 2017-08-07 DIAGNOSIS — F250 Schizoaffective disorder, bipolar type: Secondary | ICD-10-CM | POA: Diagnosis not present

## 2017-08-07 DIAGNOSIS — I509 Heart failure, unspecified: Secondary | ICD-10-CM | POA: Diagnosis not present

## 2017-08-07 DIAGNOSIS — E1142 Type 2 diabetes mellitus with diabetic polyneuropathy: Secondary | ICD-10-CM | POA: Diagnosis not present

## 2017-08-07 DIAGNOSIS — I110 Hypertensive heart disease with heart failure: Secondary | ICD-10-CM | POA: Diagnosis not present

## 2017-08-07 DIAGNOSIS — R296 Repeated falls: Secondary | ICD-10-CM | POA: Diagnosis not present

## 2017-08-07 MED ORDER — NYSTATIN 100000 UNIT/GM EX POWD: CUTANEOUS | 2 refills | Status: DC

## 2017-08-07 NOTE — Telephone Encounter (Signed)
Med refilled.

## 2017-08-07 NOTE — Telephone Encounter (Signed)
I called patient to remind her of her appt for tomorrow, while on the phone she is requesting nystatin powder. She said she will forget by tomorrow to ask. Please advise.

## 2017-08-08 ENCOUNTER — Encounter: Payer: Self-pay | Admitting: Family Medicine

## 2017-08-08 ENCOUNTER — Other Ambulatory Visit: Payer: Self-pay

## 2017-08-08 ENCOUNTER — Ambulatory Visit (INDEPENDENT_AMBULATORY_CARE_PROVIDER_SITE_OTHER): Payer: Medicare HMO | Admitting: Family Medicine

## 2017-08-08 VITALS — BP 114/54 | HR 60 | Ht 61.0 in | Wt 152.0 lb

## 2017-08-08 DIAGNOSIS — E1165 Type 2 diabetes mellitus with hyperglycemia: Secondary | ICD-10-CM

## 2017-08-08 DIAGNOSIS — Z1231 Encounter for screening mammogram for malignant neoplasm of breast: Secondary | ICD-10-CM | POA: Diagnosis not present

## 2017-08-08 DIAGNOSIS — F17210 Nicotine dependence, cigarettes, uncomplicated: Secondary | ICD-10-CM

## 2017-08-08 DIAGNOSIS — R0989 Other specified symptoms and signs involving the circulatory and respiratory systems: Secondary | ICD-10-CM

## 2017-08-08 DIAGNOSIS — E785 Hyperlipidemia, unspecified: Secondary | ICD-10-CM | POA: Diagnosis not present

## 2017-08-08 DIAGNOSIS — Z794 Long term (current) use of insulin: Secondary | ICD-10-CM | POA: Diagnosis not present

## 2017-08-08 NOTE — Patient Instructions (Addendum)
Wellness with nurse please schedule end August or early September  Physical exam with MD November 15 or after   Thankful your heart exam and blood sugar are good  Continue to work on stopping smoking  Call [facility] for help with this  There are also classes at the health department to help with this  I will write the note and script for your lift chair for this visit  I have referred you for the mammogram due end June , please schedule   Steps to Quit Smoking Smoking tobacco can be bad for your health. It can also affect almost every organ in your body. Smoking puts you and people around you at risk for many serious long-lasting (chronic) diseases. Quitting smoking is hard, but it is one of the best things that you can do for your health. It is never too late to quit. What are the benefits of quitting smoking? When you quit smoking, you lower your risk for getting serious diseases and conditions. They can include:  Lung cancer or lung disease.  Heart disease.  Stroke.  Heart attack.  Not being able to have children (infertility).  Weak bones (osteoporosis) and broken bones (fractures).  If you have coughing, wheezing, and shortness of breath, those symptoms may get better when you quit. You may also get sick less often. If you are pregnant, quitting smoking can help to lower your chances of having a baby of low birth weight. What can I do to help me quit smoking? Talk with your doctor about what can help you quit smoking. Some things you can do (strategies) include:  Quitting smoking totally, instead of slowly cutting back how much you smoke over a period of time.  Going to in-person counseling. You are more likely to quit if you go to many counseling sessions.  Using resources and support systems, such as: ? Database administrator with a Social worker. ? Phone quitlines. ? Careers information officer. ? Support groups or group counseling. ? Text messaging programs. ? Mobile  phone apps or applications.  Taking medicines. Some of these medicines may have nicotine in them. If you are pregnant or breastfeeding, do not take any medicines to quit smoking unless your doctor says it is okay. Talk with your doctor about counseling or other things that can help you.  Talk with your doctor about using more than one strategy at the same time, such as taking medicines while you are also going to in-person counseling. This can help make quitting easier. What things can I do to make it easier to quit? Quitting smoking might feel very hard at first, but there is a lot that you can do to make it easier. Take these steps:  Talk to your family and friends. Ask them to support and encourage you.  Call phone quitlines, reach out to support groups, or work with a Social worker.  Ask people who smoke to not smoke around you.  Avoid places that make you want (trigger) to smoke, such as: ? Bars. ? Parties. ? Smoke-break areas at work.  Spend time with people who do not smoke.  Lower the stress in your life. Stress can make you want to smoke. Try these things to help your stress: ? Getting regular exercise. ? Deep-breathing exercises. ? Yoga. ? Meditating. ? Doing a body scan. To do this, close your eyes, focus on one area of your body at a time from head to toe, and notice which parts of your body are  tense. Try to relax the muscles in those areas.  Download or buy apps on your mobile phone or tablet that can help you stick to your quit plan. There are many free apps, such as QuitGuide from the [employer] Office manager for Disease Control and Prevention). You can find more support from smokefree.gov and other websites.  This information is not intended to replace advice given to you by your health care provider. Make sure you discuss any questions you have with your health care provider. Document Released: 12/24/2008 Document Revised: 10/26/2015 Document Reviewed: 07/14/2014 Elsevier  Interactive Patient Education  2018 [employer].

## 2017-08-10 ENCOUNTER — Encounter (HOSPITAL_COMMUNITY): Payer: Self-pay | Admitting: Internal Medicine

## 2017-08-10 ENCOUNTER — Inpatient Hospital Stay (HOSPITAL_BASED_OUTPATIENT_CLINIC_OR_DEPARTMENT_OTHER): Payer: Medicare HMO | Admitting: Internal Medicine

## 2017-08-10 VITALS — BP 127/48 | HR 60 | Temp 98.2°F | Resp 16 | Ht 61.0 in | Wt 153.2 lb

## 2017-08-10 DIAGNOSIS — D631 Anemia in chronic kidney disease: Secondary | ICD-10-CM | POA: Diagnosis not present

## 2017-08-10 DIAGNOSIS — D509 Iron deficiency anemia, unspecified: Secondary | ICD-10-CM

## 2017-08-10 DIAGNOSIS — N189 Chronic kidney disease, unspecified: Secondary | ICD-10-CM

## 2017-08-10 DIAGNOSIS — R040 Epistaxis: Secondary | ICD-10-CM | POA: Diagnosis not present

## 2017-08-10 DIAGNOSIS — D500 Iron deficiency anemia secondary to blood loss (chronic): Secondary | ICD-10-CM

## 2017-08-10 NOTE — Progress Notes (Signed)
Diagnosis Microcytic anemia  Iron deficiency anemia due to chronic blood loss - Plan: CBC with Differential/Platelet, Comprehensive metabolic panel, Lactate dehydrogenase, Ferritin  Epistaxis - Plan: Protime-INR, APTT  Staging Cancer Staging No matching staging information was found for the patient.  Assessment and Plan: 1. Microcytic anemia.  Pt has combination anemia from iron deficiency and chronic kidney disease.  She had Colonoscopy on 02/10/2016 showing 2 sessile polyps in the descending colon, diverticuli in sigmoid colon, EGD on 02/10/2016 showing mild to moderate esophagitis, erythema in the inferior gastric antrum  Labs done 06/26/2017 showed HB 9.9 with ferritin of 14 which was consistent with IDA.  She was treated with IV iron on 07/05/2017 and 07/12/2017.  Labs done 07/31/2017 reviewed with pt and showed WBC 4.8 HB 9.5 plts 212,000.  Ferritin 377, Spep negative.  Iron levels are improved after recent IV iron. HB stable at 9.5.  She had CT of the abdomen and pelvis done 06/28/2017 that showed  IMPRESSION: 1. No acute abnormality seen to explain the patient's symptoms. Small amount of stool noted in the colon, without CT evidence for constipation. 2. Mild diverticulosis along the descending and sigmoid colon, without evidence of diverticulitis.  She should continue MVI.  She will RTC in 2 months for repeat labs and follow-up.  She should continue to follow-up with GI as recommended.    2.  DM.  Follow-up with PCP.    3.  Health maintenance.  Follow-up with GI as recommended.    Interval History:  67 y.o. female is seen by Dr. Worthy Keeler on 06/26/2017 for evaluation  and management of microcytic anemia.  Labs done 05/19/2017 showed hemoglobin was 8.6 and MCV over 72.  White count and platelet count were within normal limits.  She reports feeling very fatigued and has occasional lightheadedness.  Her last blood transfusion was in 2012.  She was treated with intravenous iron in the  past.  She denies any bleeding per rectum but has occasional dark stools.  She has been placed on iron pills for the last few months.  She gets severely constipated when she takes them. Last EGD and colonoscopy were in November 2017.  She lives by herself at home and walks with the help of walker.  She has an aide during the daytime, helping her with cooking and other daily chores.  Labs done 06/26/2017 showed HB 9.9 and ferritin 14.  She was treated with IV iron on 07/05/2017 and 07/12/2017.    Current Status:  Pt is seen for follow-up after IV iron.  She reports she feels slightly better since treatment.  She is here to go over labs.  Problem List Patient Active Problem List   Diagnosis Date Noted  . At risk for cardiovascular event [Z91.89] 06/10/2017  . Malignant hyperpyrexia [T88.3XXA] 04/25/2017  . Rash and nonspecific skin eruption [R21] 04/25/2017  . Finger pain, right [M79.644] 01/28/2017  . Hypotension [I95.9]   . Diabetic polyneuropathy associated with type 2 diabetes mellitus ([place]) [E11.42] 05/19/2016  . Smoker [F17.200] 04/24/2016  . History of palpitations [Z87.898] 08/09/2015  . Nausea without vomiting [R11.0] 08/09/2015  . Labile hypertension [R09.89] 08/03/2015  . Normal coronary arteries [Z03.89] 08/03/2015  . Left-sided low back pain with left-sided sciatica [M54.42] 06/27/2015  . Left flank pain [R10.9] 06/27/2015  . Type 2 diabetes mellitus with hyperglycemia, with long-term current use of insulin ([place]) [E11.65, Z79.4] 05/06/2015  . Multinodular goiter [E04.2] 05/06/2015  . Rectocele, female [N81.6] 04/27/2015  . Anal sphincter incontinence [R15.9]  04/27/2015  . Pelvic relaxation due to rectocele [N81.6] 03/30/2015  . Pulmonary hypertension ([place]) [I27.20] 02/22/2015  . Light cigarette smoker (1-9 cigarettes per day) [F17.210] 01/11/2015  . Migraine without aura and without status migrainosus, not intractable [G43.009] 07/02/2014  . Flatulence [R14.3] 02/18/2014  .  Microcytic anemia [D50.9] 02/18/2014  . COPD (chronic obstructive pulmonary disease) with chronic bronchitis ([place]) [J44.9] 09/16/2013  . Acute head trauma [S09.90XA] 08/16/2013  . Hypothyroidism [E03.9] 08/16/2013  . Gastroparesis [K31.84] 04/28/2013  . Seizure disorder ([place]) [T70.177] 01/19/2013  . Cervical disc disorder with radiculopathy of cervical region [M50.10] 10/31/2012  . Solitary pulmonary nodule [R91.1] 08/19/2012  . Anemia [D64.9] 07/05/2012  . Hypersomnia disorder related to a known organic factor [G47.10] 06/11/2012  . Allergic sinusitis [J30.9] 04/18/2012  . Meningioma ([place]) [D32.9] 11/19/2011  . Mononeuritis leg [G57.90] 10/25/2011  . Carpal tunnel syndrome of right wrist [G56.01] 05/23/2011  . Polypharmacy [Z79.899] 04/28/2011  . Bipolar disorder ([place]) [F31.9] 04/28/2011  . Constipation [K59.00] 04/13/2011  . Falls frequently [R29.6] 12/12/2010  . Oropharyngeal dysphagia [R13.12] 07/12/2010  . Urinary incontinence [R32] 12/16/2009  . HEARING LOSS [H91.90] 10/26/2009  . Hyperlipidemia [E78.5] 12/11/2008  . IBS [K58.9] 12/11/2008  . GERD [K21.9] 07/29/2008  . MILK PRODUCTS ALLERGY [Z91.011] 07/29/2008  . 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 Backache [M54.9] 06/19/2007  . Osteoporosis [M81.0] 06/19/2007  . Obstructive sleep apnea [G47.33] 06/19/2007  . TRIGGER FINGER [M71.50] 04/18/2007  . DIVERTICULOSIS, COLON [K57.30] 11/13/2006    Past Medical History Past Medical History:  Diagnosis Date  . Anemia   . Anxiety    takes Ativan daily  . Arthritis   . Bipolar disorder ([place])    takes Risperdal nightly  . Blood transfusion   . Cancer ([place])    In her gum  . Carpal tunnel syndrome of right wrist 05/23/2011  . Cervical disc disorder with radiculopathy of cervical region 10/31/2012  . Chronic back pain   . Chronic idiopathic constipation   . Colon polyps   . COPD (chronic obstructive pulmonary disease) with  chronic bronchitis ([place]) 09/16/2013   Office Spirometry 10/30/2013-submaximal effort based on appearance of loop and curve. Numbers would fit with severe restriction but her physiologic capability may be better than this. FVC 0.91/44%, and 10.74/45%, FEV1/FVC 0.81, FEF 25-75% 1.43/69%    . Depression    takes Zoloft daily  . Diabetes mellitus    Type II  . Diverticulosis    TCS 9/08 by Dr. Delfin Edis for diarrhea . Bx for micro scopic colitis negative.   . Fibromyalgia   . Frequent falls   . GERD (gastroesophageal reflux disease)    takes Aciphex daily  . Glaucoma    eye drops daily  . Gum symptoms    infection on antibiotic  . Hemiplegia affecting non-dominant side, post-stroke 08/02/2011  . Hyperlipidemia    takes Crestor daily  . Hypertension    takes Amlodipine,Metoprolol,and Clonidine daily  . Hypothyroidism    takes Synthroid daily  . IBS (irritable bowel syndrome)   . Insomnia    takes Trazodone nightly  . 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 Metabolic encephalopathy 9/39/0300  . Migraines    chronic headaches  . Mononeuritis lower limb   . Narcolepsy   . Osteoporosis   . Pancreatitis 2006   due to Depakote with normal EUS   . Schatzki's ring    non critical / EGD with ED 8/2011with RMR  . Seizures ([place])  takes Lamictal daily.Last seizure 3 yrs ago  . Sleep apnea    on CPAP  . Stroke [facility])    left sided weakness  . Tubular adenoma of colon     Past Surgical History Past Surgical History:  Procedure Laterality Date  . ABDOMINAL HYSTERECTOMY  1978  . BACK SURGERY  July 2012  . BACTERIAL OVERGROWTH TEST N/A 05/05/2013   Procedure: BACTERIAL OVERGROWTH TEST;  Surgeon: Daneil Dolin, MD;  [facility];  Service: Endoscopy;  Laterality: N/A;  7:30  . BIOPSY THYROID  2009  . BRAIN SURGERY  11/2011   resection of meningioma  . BREAST REDUCTION SURGERY  1994  . CARDIAC CATHETERIZATION  05/10/2005   normal coronaries, normal LV systolic function and EF  (Dr. Jackie Plum)  . CARPAL TUNNEL RELEASE Left 07/22/04   Dr. Aline Brochure  . CATARACT EXTRACTION Bilateral   . CHOLECYSTECTOMY  1984  . COLONOSCOPY N/A 09/25/2012   [id number] diverticulosis.  colonic polyp-removed : tubular adenoma  . CRANIOTOMY  11/23/2011   Procedure: CRANIOTOMY TUMOR EXCISION;  Surgeon: Hosie Spangle, MD;  Location: [facility];  Service: Neurosurgery;  Laterality: N/A;  Craniotomy for tumor resection  . ESOPHAGOGASTRODUODENOSCOPY  12/29/2010   Rourk-Retained food in the esophagus and stomach, small hiatal hernia, status post Maloney dilation of the esophagus  . ESOPHAGOGASTRODUODENOSCOPY N/A 09/25/2012   IEP:PIRJJOAC atonic baggy esophagus status post Maloney dilation 43 F. Hiatal hernia  . GIVENS CAPSULE STUDY N/A 01/15/2013   NORMAL.   . IR GENERIC HISTORICAL  03/17/2016   IR RADIOLOGIST EVAL & MGMT 03/17/2016 [facility]  . LESION REMOVAL N/A 05/31/2015   Procedure: REMOVAL RIGHT AND LEFT LESIONS OF MANDIBLE;  Surgeon: Diona Browner, DDS;  Location: [facility];  Service: Oral Surgery;  Laterality: N/A;  . MALONEY DILATION  12/29/2010   RMR;  . NM MYOCAR PERF WALL MOTION  2006   "relavtiely normal" persantine, mild anterior thinning (breast attenuation artifact), no region of scar/ischemia  . OVARIAN CYST REMOVAL    . RECTOCELE REPAIR N/A 06/29/2015   Procedure: POSTERIOR REPAIR (RECTOCELE);  Surgeon: Jonnie Kind, MD;  Location: [facility];  Service: Gynecology;  Laterality: N/A;  . REDUCTION MAMMAPLASTY Bilateral   . SPINE SURGERY  09/29/2010   Dr. Rolena Infante  . surgical excision of 3 tumors from right thigh and right buttock  and left upper thigh  2010  . TOOTH EXTRACTION Bilateral 12/14/2014   Procedure: REMOVAL OF BILATERAL MANDIBULAR EXOSTOSES;  Surgeon: Diona Browner, DDS;  Location: [facility];  Service: Oral Surgery;  Laterality: Bilateral;  . TRANSTHORACIC ECHOCARDIOGRAM  2010   EF 60-65%, mild conc LVH, grade 1 diastolic dysfunction; mildly calcified MV annulus with mildly  thickened leaflets, mildly calcified MR annulus    Family History Family History  Problem Relation Age of Onset  . Heart attack Mother        HTN  . Pneumonia Father   . Kidney failure Father   . Diabetes Father   . Pancreatic cancer Sister   . Diabetes Brother   . Hypertension Brother   . Diabetes Brother   . Cancer Sister        breast   . Hypertension Son   . Sleep apnea Son   . Cancer Sister        pancreatic  . Stroke Maternal Grandmother   . Heart attack Maternal Grandfather   . Alcohol abuse Maternal Uncle   . Colon cancer Neg Hx   .  Anesthesia problems Neg Hx   . Hypotension Neg Hx   . Malignant hyperthermia Neg Hx   . Pseudochol deficiency Neg Hx      Social History  reports that she has been smoking cigarettes.  She has a 1.75 pack-year smoking history. She has never used smokeless tobacco. She reports that she does not drink alcohol or use drugs.  Medications  Current Outpatient Medications:  .  ACCU-CHEK SMARTVIEW test strip, TEST BLOOD SUGAR FOUR TIMES DAILY AS DIRECTED, Disp: 350 each, Rfl: 2 .  albuterol (PROVENTIL HFA;VENTOLIN HFA) 108 (90 Base) MCG/ACT inhaler, Inhale 1-2 puffs every 6 hours as needed for wheezing, shortness of breath, Disp: 3 Inhaler, Rfl: 3 .  ascorbic acid (VITAMIN C) 500 MG tablet, Take 500 mg by mouth daily., Disp: , Rfl:  .  aspirin EC 81 MG tablet, Take 81 mg by mouth daily., Disp: , Rfl:  .  Cholecalciferol (VITAMIN D3) 5000 units CAPS, Take 1 capsule by mouth daily., Disp: , Rfl:  .  cloNIDine (CATAPRES) 0.3 MG tablet, TAKE 1 TABLET BY MOUTH EVERY EIGHT HOURS. ONCE AT 8AM, 4PM, AND 12 MIDNIGHT., Disp: 270 tablet, Rfl: 1 .  diclofenac sodium (VOLTAREN) 1 % GEL, Apply 2 g topically daily as needed (pain). Rubs "wherever there is pain" when she doesn't take her pain pill., Disp: , Rfl:  .  dicyclomine (BENTYL) 10 MG capsule, Take 1 tab by mouth twice daily as needed for abdominal cramping., Disp: 60 capsule, Rfl: 6 .  docusate  sodium (COLACE) 100 MG capsule, Take 100 mg by mouth 2 (two) times daily., Disp: , Rfl:  .  hydroxychloroquine (PLAQUENIL) 200 MG tablet, Take 200 mg by mouth daily. , Disp: , Rfl:  .  insulin aspart (NOVOLOG FLEXPEN) 100 UNIT/ML FlexPen, INJECT 10 TO 14 UNITS INTO THE SKIN 3 (THREE) TIMES DAILY WITH MEALS., Disp: 45 mL, Rfl: 3 .  Insulin Glargine (TOUJEO SOLOSTAR) 300 UNIT/ML SOPN, Inject 20 Units into the skin at bedtime., Disp: 9 pen, Rfl: 3 .  lamoTRIgine (LAMICTAL) 100 MG tablet, Take 1 tablet (100 mg total) by mouth 2 (two) times daily., Disp: 180 tablet, Rfl: 3 .  levothyroxine (SYNTHROID, LEVOTHROID) 50 MCG tablet, TAKE 1 TABLET BY MOUTH ONCE DAILY AND 1/2 TABLET ON SUNDAYS., Disp: 30 tablet, Rfl: 0 .  LORazepam (ATIVAN) 0.5 MG tablet, Take 1 tablet (0.5 mg total) by mouth 3 (three) times daily., Disp: 270 tablet, Rfl: 2 .  losartan (COZAAR) 50 MG tablet, Take 1 tablet (50 mg total) by mouth daily., Disp: 90 tablet, Rfl: 1 .  meclizine (ANTIVERT) 25 MG tablet, Take 25 mg by mouth 3 (three) times daily as needed for dizziness. 07/10/17 Has medication and reports not taking often,  but says she takes if dizzy., Disp: , Rfl:  .  metoprolol tartrate (LOPRESSOR) 50 MG tablet, TAKE 1 TABLET BY MOUTH TWICE DAILY., Disp: 60 tablet, Rfl: 9 .  montelukast (SINGULAIR) 10 MG tablet, TAKE ONE TABLET BY MOUTH ONCE DAILY., Disp: 90 tablet, Rfl: 0 .  Multiple Vitamins-Minerals (HM MULTIVITAMIN ADULT GUMMY PO), Take 1 tablet by mouth daily., Disp: , Rfl:  .  NYAMYC powder, APPLY TO AFFECTED AREA FOUR TIMES DAILY., Disp: 30 g, Rfl: 0 .  nystatin (NYAMYC) powder, APPLY TO AFFECTED AREA 4 TIMES DAILY., Disp: 30 g, Rfl: 2 .  oxyCODONE-acetaminophen (PERCOCET/ROXICET) 5-325 MG tablet, Take by mouth as needed., Disp: , Rfl:  .  polyethylene glycol (MIRALAX) packet, Take 17 g by mouth daily., Disp: 14  each, Rfl: 0 .  pregabalin (LYRICA) 75 MG capsule, Take 1 capsule (75 mg total) by mouth daily., Disp: , Rfl:  .   RABEprazole (ACIPHEX) 20 MG tablet, Take 1 tablet (20 mg total) by mouth 2 (two) times daily., Disp: 60 tablet, Rfl: 3 .  RESTASIS 0.05 % ophthalmic emulsion, Place 1 drop into both eyes 2 (two) times daily. , Disp: , Rfl:  .  risperiDONE (RISPERDAL) 0.5 MG tablet, Take 1 tablet (0.5 mg total) by mouth at bedtime., Disp: 90 tablet, Rfl: 2 .  rosuvastatin (CRESTOR) 5 MG tablet, TAKE 1 TABLET BY MOUTH AT BEDTIME., Disp: 90 tablet, Rfl: 0 .  sertraline (ZOLOFT) 100 MG tablet, Take 1 tablet (100 mg total) by mouth daily., Disp: 90 tablet, Rfl: 2 .  traZODone (DESYREL) 100 MG tablet, Take 1 tablet (100 mg total) by mouth at bedtime., Disp: 90 tablet, Rfl: 2 .  triamcinolone ointment (KENALOG) 0.1 %, Apply 1 application topically at bedtime. Apply to elbows at bedtime., Disp: , Rfl:  .  UNABLE TO FIND, Lift chair x 1  DX, Disp: 1 each, Rfl: 0 .  cetirizine (ZYRTEC) 10 MG tablet, Take 1 tablet (10 mg total) by mouth daily. (Patient not taking: Reported on 08/10/2017), Disp: 7 tablet, Rfl: 0 .  lidocaine (XYLOCAINE) 2 % jelly, Apply to left abdominal wall twice daily. (Patient not taking: Reported on 08/10/2017), Disp: 30 mL, Rfl: 3  Allergies Cephalexin; Iron; Milk-related compounds; Penicillins; and Phenazopyridine hcl  Review of Systems Review of Systems - Oncology ROS as per HPI otherwise 12 point ROS is negative.   Physical Exam  Vitals Wt Readings from Last 3 Encounters:  08/10/17 153 lb 4 oz (69.5 kg)  08/08/17 152 lb 0.6 oz (69 kg)  07/26/17 154 lb (69.9 kg)   Temp Readings from Last 3 Encounters:  08/10/17 98.2 F (36.8 C) (Oral)  07/12/17 (!) 97.2 F (36.2 C) (Oral)  07/05/17 98.3 F (36.8 C) (Oral)   BP Readings from Last 3 Encounters:  08/10/17 (!) 127/48  08/08/17 (!) 114/54  07/26/17 (!) 198/80   Pulse Readings from Last 3 Encounters:  08/10/17 60  08/08/17 60  07/26/17 71   Constitutional: Well-developed, well-nourished, and in no distress.   HENT: Head:  Normocephalic and atraumatic.  Mouth/Throat: No oropharyngeal exudate. Mucosa moist. Eyes: Pupils are equal, round, and reactive to light. Conjunctivae are normal. No scleral icterus.  Neck: Normal range of motion. Neck supple. No JVD present.  Cardiovascular: Normal rate, regular rhythm and normal heart sounds.  Exam reveals no gallop and no friction rub.   No murmur heard. Pulmonary/Chest: Effort normal and breath sounds normal. No respiratory distress. No wheezes.No rales.  Abdominal: Soft. Bowel sounds are normal. No distension. There is no tenderness. There is no guarding.  Musculoskeletal: No edema or tenderness.  Lymphadenopathy: No cervical, axillary or supraclavicular adenopathy.  Neurological: Alert and oriented to person, place, and time. No cranial nerve deficit.  Skin: Skin is warm and dry. No rash noted. No erythema. No pallor.  Psychiatric: Affect and judgment normal.   Labs No visits with results within 3 Day(s) from this visit.  Latest known visit with results is:  Hospital Outpatient Visit on 08/02/2017  Component Date Value Ref Range Status  . Rest HR 08/02/2017 57  bpm Final  . Rest BP 08/02/2017 161/77  mmHg Final  . Peak HR 08/02/2017 80  bpm Final  . Peak BP 08/02/2017 163/69  mmHg Final  . SSS 08/02/2017 1  Final  . SRS 08/02/2017 1   Final  . SDS 08/02/2017 0   Final  . LHR 08/02/2017 0.36   Final  . TID 08/02/2017 1.40   Final  . LV sys vol 08/02/2017 19  mL Final  . LV dias vol 08/02/2017 67  46 - 106 mL Final     Pathology Orders Placed This Encounter  Procedures  . CBC with Differential/Platelet    Standing Status:   Future    Standing Expiration Date:   08/11/2018  . Comprehensive metabolic panel    Standing Status:   Future    Standing Expiration Date:   08/11/2018  . Lactate dehydrogenase    Standing Status:   Future    Standing Expiration Date:   08/11/2018  . Ferritin    Standing Status:   Future    Standing Expiration Date:   08/11/2018   . Protime-INR    Standing Status:   Future    Standing Expiration Date:   08/11/2018  . APTT    Standing Status:   Future    Standing Expiration Date:   08/11/2018       Zoila Shutter MD

## 2017-08-11 DIAGNOSIS — J449 Chronic obstructive pulmonary disease, unspecified: Secondary | ICD-10-CM | POA: Diagnosis not present

## 2017-08-11 DIAGNOSIS — I10 Essential (primary) hypertension: Secondary | ICD-10-CM | POA: Diagnosis not present

## 2017-08-14 ENCOUNTER — Other Ambulatory Visit: Payer: Self-pay | Admitting: Physician Assistant

## 2017-08-14 ENCOUNTER — Telehealth: Payer: Self-pay | Admitting: *Deleted

## 2017-08-14 DIAGNOSIS — I509 Heart failure, unspecified: Secondary | ICD-10-CM | POA: Diagnosis not present

## 2017-08-14 DIAGNOSIS — E1142 Type 2 diabetes mellitus with diabetic polyneuropathy: Secondary | ICD-10-CM | POA: Diagnosis not present

## 2017-08-14 DIAGNOSIS — F250 Schizoaffective disorder, bipolar type: Secondary | ICD-10-CM | POA: Diagnosis not present

## 2017-08-14 DIAGNOSIS — I110 Hypertensive heart disease with heart failure: Secondary | ICD-10-CM | POA: Diagnosis not present

## 2017-08-14 DIAGNOSIS — Z794 Long term (current) use of insulin: Secondary | ICD-10-CM | POA: Diagnosis not present

## 2017-08-14 DIAGNOSIS — I69354 Hemiplegia and hemiparesis following cerebral infarction affecting left non-dominant side: Secondary | ICD-10-CM | POA: Diagnosis not present

## 2017-08-14 DIAGNOSIS — J449 Chronic obstructive pulmonary disease, unspecified: Secondary | ICD-10-CM | POA: Diagnosis not present

## 2017-08-14 DIAGNOSIS — Z87891 Personal history of nicotine dependence: Secondary | ICD-10-CM | POA: Diagnosis not present

## 2017-08-14 DIAGNOSIS — R296 Repeated falls: Secondary | ICD-10-CM | POA: Diagnosis not present

## 2017-08-14 NOTE — Telephone Encounter (Signed)
Received request in Epic for a refill on Lidocaine 2 % jelly.Nicoletta Ba PA saw patient in April 2019.  Amy agreed for me to send refill for this today 08-14-2017.

## 2017-08-16 ENCOUNTER — Other Ambulatory Visit: Payer: Self-pay

## 2017-08-16 NOTE — Patient Outreach (Signed)
[facility]) Care Management  Central  08/16/2017   [name] Sep 02, 1950 [id number]  Subjective: Telephone call placed to the patient for monthly assessment. HIPAA verified.  The patient states that she is doing fair.  She woke up this morning having leg pain that she rates at a 8/10.  She states that it is a chronic pain. Her blood pressure this morning was 144/78.   She had a fall coming out of her bathroom and went down on her knee.  She did not sustain any injuries. I went over fall precautions with the patient and she verbalized understanding.  The patient saw her primary last week and she looked at the bump on her leg and felt it was a cyst.  She states she is adherent with her medications and is watching the salt in her diet.    The patient states that she has and appointment to have her mammogram done on July 1st.  She is scheduled to have a flu shot in August and she will follow up with her primary care in 3-4 months   Encounter Medications:  Outpatient Encounter Medications as of 08/16/2017  Medication Sig Note  . ACCU-CHEK SMARTVIEW test strip TEST BLOOD SUGAR FOUR TIMES DAILY AS DIRECTED   . albuterol (PROVENTIL HFA;VENTOLIN HFA) 108 (90 Base) MCG/ACT inhaler Inhale 1-2 puffs every 6 hours as needed for wheezing, shortness of breath   . ascorbic acid (VITAMIN C) 500 MG tablet Take 500 mg by mouth daily.   [name] aspirin EC 81 MG tablet Take 81 mg by mouth daily.   . Cholecalciferol (VITAMIN D3) 5000 units CAPS Take 1 capsule by mouth daily.   . cloNIDine (CATAPRES) 0.3 MG tablet TAKE 1 TABLET BY MOUTH EVERY EIGHT HOURS. ONCE AT 8AM, 4PM, AND 12 MIDNIGHT.   [name] diclofenac sodium (VOLTAREN) 1 % GEL Apply 2 g topically daily as needed (pain). Rubs "wherever there is pain" when she doesn't take her pain pill.   . dicyclomine (BENTYL) 10 MG capsule Take 1 tab by mouth twice daily as needed for abdominal cramping.   . docusate sodium (COLACE) 100 MG capsule Take 100  mg by mouth 2 (two) times daily.   . hydroxychloroquine (PLAQUENIL) 200 MG tablet Take 200 mg by mouth daily.    . insulin aspart (NOVOLOG FLEXPEN) 100 UNIT/ML FlexPen INJECT 10 TO 14 UNITS INTO THE SKIN 3 (THREE) TIMES DAILY WITH MEALS.   [name] Insulin Glargine (TOUJEO SOLOSTAR) 300 UNIT/ML SOPN Inject 20 Units into the skin at bedtime.   . lamoTRIgine (LAMICTAL) 100 MG tablet Take 1 tablet (100 mg total) by mouth 2 (two) times daily.   [name] levothyroxine (SYNTHROID, LEVOTHROID) 50 MCG tablet TAKE 1 TABLET BY MOUTH ONCE DAILY AND 1/2 TABLET ON SUNDAYS.   [name] LORazepam (ATIVAN) 0.5 MG tablet Take 1 tablet (0.5 mg total) by mouth 3 (three) times daily.   [name] losartan (COZAAR) 50 MG tablet Take 1 tablet (50 mg total) by mouth daily.   . meclizine (ANTIVERT) 25 MG tablet Take 25 mg by mouth 3 (three) times daily as needed for dizziness. 07/10/17 Has medication and reports not taking often,  but says she takes if dizzy.   . metoprolol tartrate (LOPRESSOR) 50 MG tablet TAKE 1 TABLET BY MOUTH TWICE DAILY.   . montelukast (SINGULAIR) 10 MG tablet TAKE ONE TABLET BY MOUTH ONCE DAILY.   . Multiple Vitamins-Minerals (HM MULTIVITAMIN ADULT GUMMY PO) Take 1 tablet by mouth daily.   [name]  NYAMYC powder APPLY TO AFFECTED AREA FOUR TIMES DAILY.   [name] nystatin (NYAMYC) powder APPLY TO AFFECTED AREA 4 TIMES DAILY.   [name] oxyCODONE-acetaminophen (PERCOCET/ROXICET) 5-325 MG tablet Take by mouth as needed.   . polyethylene glycol (MIRALAX) packet Take 17 g by mouth daily.   . pregabalin (LYRICA) 75 MG capsule Take 1 capsule (75 mg total) by mouth daily.   . RABEprazole (ACIPHEX) 20 MG tablet Take 1 tablet (20 mg total) by mouth 2 (two) times daily.   . RESTASIS 0.05 % ophthalmic emulsion Place 1 drop into both eyes 2 (two) times daily.    . risperiDONE (RISPERDAL) 0.5 MG tablet Take 1 tablet (0.5 mg total) by mouth at bedtime.   . rosuvastatin (CRESTOR) 5 MG tablet TAKE 1 TABLET BY MOUTH AT BEDTIME.   [name] sertraline (ZOLOFT) 100 MG  tablet Take 1 tablet (100 mg total) by mouth daily.   . traZODone (DESYREL) 100 MG tablet Take 1 tablet (100 mg total) by mouth at bedtime.   . triamcinolone ointment (KENALOG) 0.1 % Apply 1 application topically at bedtime. Apply to elbows at bedtime.   . cetirizine (ZYRTEC) 10 MG tablet Take 1 tablet (10 mg total) by mouth daily. (Patient not taking: Reported on 08/10/2017)   . lidocaine (XYLOCAINE) 2 % jelly Apply to left abdominal wall twice daily. (Patient not taking: Reported on 08/10/2017)   . lidocaine (XYLOCAINE) 2 % jelly APPLY TO LEFT ABDOMINAL WALL TWICE DAILY. (Patient not taking: Reported on 08/16/2017)   . UNABLE TO FIND Lift chair x 1  DX 08/16/2017: Patient states she has it yet    No facility-administered encounter medications on file as of 08/16/2017.     Functional Status:  In your present state of health, do you have any difficulty performing the following activities: 04/30/2017  Hearing? N  Vision? N  Difficulty concentrating or making decisions? N  Walking or climbing stairs? Y  Dressing or bathing? Y  Doing errands, shopping? Y  Preparing Food and eating ? N  Using the Toilet? N  In the past six months, have you accidently leaked urine? Y  Do you have problems with loss of bowel control? N  Managing your Medications? Y  Managing your Finances? Y  Housekeeping or managing your Housekeeping? Y  Some recent data might be hidden    Fall/Depression Screening: Fall Risk  08/08/2017 07/26/2017 07/18/2017  Falls in the past year? Yes Yes Yes  Number falls in past yr: 2 or more 2 or more 2 or more  Injury with Fall? - Yes No  Comment - - -  Risk Factor Category  High Fall Risk High Fall Risk High Fall Risk  Risk for fall due to : Impaired balance/gait;Impaired mobility History of fall(s);Impaired mobility History of fall(s);Impaired balance/gait;Impaired mobility;Medication side effect  Follow up - - Education provided;Falls prevention discussed   PHQ 2/9 Scores 08/08/2017  06/29/2017 06/28/2017 04/30/2017 04/11/2017 12/06/2016 09/25/2016  PHQ - 2 Score 2 1 1 1 1 2 2   PHQ- 9 Score 5 - - - - 5 9    Assessment: Patient will continue to benefit from health coach outreach for disease management and support. THN CM Care Plan Problem One     Most Recent Value  THN Long Term Goal   In 90 days the patient will verbalize lowering her blood pressure and no hospital visits  [facility] Long Term Goal Start Date  08/16/17  Interventions for Problem One Long Term Goal  reviewed signs and  syptoms of hypertension, diet and medication adherence     Plan: RN Health Coach will contact patient in the month of July and patient agrees to next outreach  [place], BSN, [place]:  [phone]  Fax: [phone]

## 2017-08-20 ENCOUNTER — Ambulatory Visit (INDEPENDENT_AMBULATORY_CARE_PROVIDER_SITE_OTHER): Payer: Medicare HMO | Admitting: Otolaryngology

## 2017-08-20 DIAGNOSIS — R040 Epistaxis: Secondary | ICD-10-CM | POA: Diagnosis not present

## 2017-08-22 ENCOUNTER — Encounter: Payer: Self-pay | Admitting: Internal Medicine

## 2017-08-22 DIAGNOSIS — G894 Chronic pain syndrome: Secondary | ICD-10-CM | POA: Diagnosis not present

## 2017-08-22 DIAGNOSIS — M5030 Other cervical disc degeneration, unspecified cervical region: Secondary | ICD-10-CM | POA: Diagnosis not present

## 2017-08-22 DIAGNOSIS — Z79891 Long term (current) use of opiate analgesic: Secondary | ICD-10-CM | POA: Diagnosis not present

## 2017-08-23 ENCOUNTER — Other Ambulatory Visit: Payer: Self-pay

## 2017-08-23 ENCOUNTER — Ambulatory Visit (INDEPENDENT_AMBULATORY_CARE_PROVIDER_SITE_OTHER): Payer: Medicare HMO | Admitting: Internal Medicine

## 2017-08-23 ENCOUNTER — Encounter: Payer: Self-pay | Admitting: Internal Medicine

## 2017-08-23 ENCOUNTER — Telehealth: Payer: Self-pay | Admitting: Family Medicine

## 2017-08-23 VITALS — BP 116/68 | HR 73 | Ht 59.0 in | Wt 153.0 lb

## 2017-08-23 DIAGNOSIS — G4733 Obstructive sleep apnea (adult) (pediatric): Secondary | ICD-10-CM

## 2017-08-23 DIAGNOSIS — J449 Chronic obstructive pulmonary disease, unspecified: Secondary | ICD-10-CM

## 2017-08-23 DIAGNOSIS — Z720 Tobacco use: Secondary | ICD-10-CM

## 2017-08-23 MED ORDER — DICLOFENAC SODIUM 1 % TD GEL: 2.0000 g | Freq: Every day | TRANSDERMAL | 0 refills | Status: DC | PRN

## 2017-08-23 NOTE — Telephone Encounter (Signed)
Done

## 2017-08-23 NOTE — Patient Instructions (Addendum)
We will continue CPAP 5-15, mask of choice, humidifier, supplies, AirView  We really want to be able to use your CPAP all night, every night, for it to help you. Ok to change masks if that is what is needed to stop te nose bleeds.  Try the samples of saline nasal gel. You can get get this at the drug store without a prescription.

## 2017-08-23 NOTE — Telephone Encounter (Signed)
Cb# [id number]/ [id number]  Patient called to request refill for volteran gel rx.  Send to [name]

## 2017-08-23 NOTE — Progress Notes (Signed)
HPI F Former smoker followed for obstructive sleep apnea with hypersomnia, , R/O'd Lung nodule,Wheezing dyspnea, complicated by hx resection of meningioma. Bipolar, GERD, DM, chronic epistaxis NPSG 3/8/13[name]- mild obstructive sleep apnea, AHI 14 per hour, mostly in REM. Epworth sleepiness score 14/24. Weight 164 pounds. She is living alone, divorced and disabled. Son has OSA. History of meningioma 11/23/2011. No seizure in over 3 years. Does not drive Office Spirometry 10/30/2013-submaximal effort based on appearance of loop and curve. Numbers would fit with severe restriction but her physiologic capability may be better than this. FVC 0.91/44%, FEV1 0.74/45%, FEV1/FVC 0.81, FEF 25-75% 1.43/69%. Office Spirometry 07/27/2015-weak effort but still better than her last previous trial effort. Mild restriction of exhaled volume. FVC 1.35/67%, FEV1 1.27/79%, FEV1/FVC 0.94, FEF 25-75 percent 2.14/107% -------------------------------------------------------------------------------------------------------------  07/27/16- 63 yoF smoker followed for obstructive sleep apnea with hypersomnia, , R/O'd Lung nodule,Wheezing dyspnea, complicated by hx resection of meningioma. Bipolar, GERD, DM, chronic epistaxis CPAP 7/ [facility] FOLLOW UP FOR DME IS [place] patient is not wearing her cpap due to nose bleeds almost every night patient did not bring her cpap in as she was asked to . patient stated that she is not using it right now . Smoking again- discussed Transportation is hard for her-no cough. Continues follow-up with psychiatry.  Has had packing by ENT for recurrent epistaxis- Dr [name]. Little cough. She asks about updating her chest x-ray. Previous imaging had indicated scarring.  08/23/2017-  11 yoF smoker followed for obstructive sleep apnea with hypersomnia, , R/O'd Lung nodule,Wheezing dyspnea, complicated by hx resection of meningioma. Bipolar, GERD, DM, chronic epistaxis CPAP 5-15 auto/  [facility] Download 7% compliance, AHI 66/ hr. Reviewed with her. -----OSA. She forgets to use CPAP some nights. She is still having issues with nosebleeds, sometimes wakes up in the night with them.  Has been to ER with epistaxis and followed by Dr Teoh/ ENT.  Likes nasal pillows mask. Remains sleepy. She can't articulate why she doesn't use CPAP more . Not trying to reduce smoking- discussed CXR 05/19/2017 Bronchitic changes.  ROS-see HPI + = positive Constitutional:   No-   weight loss, night sweats, fevers, chills, +fatigue, lassitude. HEENT:  + headaches, +difficulty swallowing, tooth/dental problems, sore throat,       No-sneezing, itching, ear ache, nasal congestion, post nasal drip,  CV:  chest pain, orthopnea, PND, swelling in lower extremities, anasarca, dizziness, +palpitations Resp: +  shortness of breath with exertion or at rest.                productive cough,   non-productive cough,  No- coughing up of blood.              No-   change in color of mucus.   wheezing.   Skin: No-   rash or lesions. GI:  +heartburn, +indigestion, no-abdominal pain, nausea, vomiting,  GU:  MS:  + joint pain or swelling.   Neuro-     nothing unusual Psych:  No- change in mood or affect. +depression or +anxiety.  No memory loss.  OBJ- Physical Exam - + she speaks slowly but seems alert and appropriate, neatly dressed today General-  Oriented, Affect-flat, Distress- none acute Skin- rash-none, lesions- none, excoriation- none Lymphadenopathy- none Head- Eyes- Gross vision intact, PERRLA, conjunctivae and secretions clear            Ears- Hearing, canals-normal + = positive            Nose- no-Septal dev, polyps,  erosion, perforation             Throat- Mallampati III , mucosa clear , drainage- none, tonsils- atrophic, + dentures Neck- flexible , trachea midline, no stridor , thyroid nl, carotid no bruit Chest - symmetrical excursion , unlabored           Heart/CV- RRR , no murmur , no gallop  , no  rub, nl s1 s2                           - JVD- none , edema- none, stasis changes- none, varices- none           Lung- clear to P&A, wheeze- none, cough+rattling , dullness-none, rub- none           Chest wall-  Abd-  Br/ Gen/ Rectal- Not done, not indicated Extrem- cyanosis- none, clubbing, none, atrophy- none, strength- nl. +cane Neuro- + seems fully oriented but vague. Rocking in chair.

## 2017-08-24 ENCOUNTER — Ambulatory Visit: Payer: Medicare HMO | Admitting: Podiatry

## 2017-08-28 ENCOUNTER — Telehealth: Payer: Self-pay | Admitting: Family Medicine

## 2017-08-28 DIAGNOSIS — E1142 Type 2 diabetes mellitus with diabetic polyneuropathy: Secondary | ICD-10-CM | POA: Diagnosis not present

## 2017-08-28 DIAGNOSIS — Z87891 Personal history of nicotine dependence: Secondary | ICD-10-CM | POA: Diagnosis not present

## 2017-08-28 DIAGNOSIS — J449 Chronic obstructive pulmonary disease, unspecified: Secondary | ICD-10-CM | POA: Diagnosis not present

## 2017-08-28 DIAGNOSIS — I110 Hypertensive heart disease with heart failure: Secondary | ICD-10-CM | POA: Diagnosis not present

## 2017-08-28 DIAGNOSIS — I509 Heart failure, unspecified: Secondary | ICD-10-CM | POA: Diagnosis not present

## 2017-08-28 DIAGNOSIS — R296 Repeated falls: Secondary | ICD-10-CM | POA: Diagnosis not present

## 2017-08-28 DIAGNOSIS — I69354 Hemiplegia and hemiparesis following cerebral infarction affecting left non-dominant side: Secondary | ICD-10-CM | POA: Diagnosis not present

## 2017-08-28 DIAGNOSIS — F250 Schizoaffective disorder, bipolar type: Secondary | ICD-10-CM | POA: Diagnosis not present

## 2017-08-28 DIAGNOSIS — Z794 Long term (current) use of insulin: Secondary | ICD-10-CM | POA: Diagnosis not present

## 2017-08-28 NOTE — Telephone Encounter (Signed)
FAX rx for lift Chair to FAX # [phone] this is for [facility]

## 2017-08-29 ENCOUNTER — Telehealth: Payer: Self-pay | Admitting: Family Medicine

## 2017-08-29 ENCOUNTER — Other Ambulatory Visit: Payer: Self-pay

## 2017-08-29 DIAGNOSIS — I110 Hypertensive heart disease with heart failure: Secondary | ICD-10-CM | POA: Diagnosis not present

## 2017-08-29 DIAGNOSIS — Z87891 Personal history of nicotine dependence: Secondary | ICD-10-CM | POA: Diagnosis not present

## 2017-08-29 DIAGNOSIS — R296 Repeated falls: Secondary | ICD-10-CM

## 2017-08-29 DIAGNOSIS — E1142 Type 2 diabetes mellitus with diabetic polyneuropathy: Secondary | ICD-10-CM | POA: Diagnosis not present

## 2017-08-29 DIAGNOSIS — I69354 Hemiplegia and hemiparesis following cerebral infarction affecting left non-dominant side: Secondary | ICD-10-CM | POA: Diagnosis not present

## 2017-08-29 DIAGNOSIS — F250 Schizoaffective disorder, bipolar type: Secondary | ICD-10-CM | POA: Diagnosis not present

## 2017-08-29 DIAGNOSIS — I509 Heart failure, unspecified: Secondary | ICD-10-CM | POA: Diagnosis not present

## 2017-08-29 DIAGNOSIS — J449 Chronic obstructive pulmonary disease, unspecified: Secondary | ICD-10-CM | POA: Diagnosis not present

## 2017-08-29 DIAGNOSIS — Z794 Long term (current) use of insulin: Secondary | ICD-10-CM | POA: Diagnosis not present

## 2017-08-29 MED ORDER — UNABLE TO FIND: 0 refills | Status: DC

## 2017-08-29 NOTE — Telephone Encounter (Signed)
Faxed as requested

## 2017-08-29 NOTE — Telephone Encounter (Signed)
Dorian Pod w/ [facility] left message with an FYI that patient has been reopened under medicaid for [employer]. Cb#: [id number]/ [id number]

## 2017-08-30 ENCOUNTER — Encounter: Payer: Self-pay | Admitting: Podiatry

## 2017-08-30 ENCOUNTER — Ambulatory Visit (INDEPENDENT_AMBULATORY_CARE_PROVIDER_SITE_OTHER): Payer: Medicare HMO | Admitting: Podiatry

## 2017-08-30 ENCOUNTER — Ambulatory Visit (INDEPENDENT_AMBULATORY_CARE_PROVIDER_SITE_OTHER): Payer: Medicare HMO

## 2017-08-30 DIAGNOSIS — M7751 Other enthesopathy of right foot: Secondary | ICD-10-CM | POA: Diagnosis not present

## 2017-08-30 DIAGNOSIS — S99921A Unspecified injury of right foot, initial encounter: Secondary | ICD-10-CM | POA: Diagnosis not present

## 2017-08-30 DIAGNOSIS — E1142 Type 2 diabetes mellitus with diabetic polyneuropathy: Secondary | ICD-10-CM

## 2017-08-30 DIAGNOSIS — R600 Localized edema: Secondary | ICD-10-CM

## 2017-08-30 DIAGNOSIS — B351 Tinea unguium: Secondary | ICD-10-CM

## 2017-08-30 DIAGNOSIS — M779 Enthesopathy, unspecified: Secondary | ICD-10-CM

## 2017-08-31 DIAGNOSIS — M150 Primary generalized (osteo)arthritis: Secondary | ICD-10-CM | POA: Diagnosis not present

## 2017-08-31 DIAGNOSIS — I639 Cerebral infarction, unspecified: Secondary | ICD-10-CM | POA: Diagnosis not present

## 2017-08-31 DIAGNOSIS — R32 Unspecified urinary incontinence: Secondary | ICD-10-CM | POA: Diagnosis not present

## 2017-09-02 NOTE — Progress Notes (Signed)
  Subjective:  Patient ID: [name], female    DOB: 1950-12-03,  MRN: [id number]  Chief Complaint  Patient presents with  . Foot Injury    Rt foot injury thursday , increased pain and swelling   67 y.o. female returns for diabetic foot care.  Here for routine foot care.  New complaint of right foot injury.  States that she has her foot on door week ago and is swollen painful  Objective:   General AA&O x3. Normal mood and affect.  Vascular Dorsalis pedis pulses present 1+ bilaterally  Posterior tibial pulses 1+ bilaterally  Capillary refill normal to all digits. Pedal hair growth normal. Edema right foot  Neurologic Epicritic sensation present bilaterally. Protective sensation with 5.07 monofilament  absentbilaterally. Vibratory sensation present bilaterally.  Dermatologic No open lesions. Interspaces clear of maceration.  Normal skin temperature and turgor. Hyperkeratotic lesions: None bilaterally. Nails: brittle, onychomycosis, thickening, elongation  Orthopedic: No history of amputation. MMT 5/5 in dorsiflexion, plantarflexion, inversion, and eversion. Normal lower extremity joint ROM without pain or crepitus.  No pain to palpation about any bony prominence of his right foot      Assessment & Plan:  Patient was evaluated and treated and all questions answered.  Diabetes with DPN, Onychomycosis -Educated on diabetic footcare. Diabetic risk level 1 -At risk foot care provided as below.  Procedure: Nail Debridement Rationale: Patient meets criteria for routine foot care due to 1 Class B, 2 class C findings. Type of Debridement: manual, sharp debridement. Instrumentation: Nail nipper, rotary burr. Number of Nails: 10  Edema right foot -Edema without underlying osseous abnormality -Unna boot applied for compression and edema reduction  Return in about 3 months (around 11/30/2017) for Diabetic Foot Care.

## 2017-09-03 ENCOUNTER — Ambulatory Visit (INDEPENDENT_AMBULATORY_CARE_PROVIDER_SITE_OTHER): Payer: Medicare HMO | Admitting: Psychiatry

## 2017-09-03 DIAGNOSIS — F331 Major depressive disorder, recurrent, moderate: Secondary | ICD-10-CM

## 2017-09-03 NOTE — Progress Notes (Signed)
Patient:  [name]                DOB: May 08, 1950          MR Number:  [id number]  Location:   [place]:  [address] [place],  [place],    [place]          Start:  Monday 09/03/2017 2:10 PM  End:       Monday 09/03/2017 3:00 PM            Provider/Observer:     Maurice Small, MSW, LCSW   Chief Complaint:                                                    Depression  Reason For Service:               [name] is a 67 y.o. female who presents with a long standing history of recurrent periods of depression beginning when she was 43 and her favorite uncle died. She h         as been experiencing increased symptoms of depression recently due to stress in the relationship with her son. Per patient's report, he rarely visits her anymore due to his involvement with an older woman who is controlling. She also reports frustration regarding her knees who is her power of attorn   ey and is very demanding perpatient's report. She reports issues with other family members and states she needs help dealing with her family Patient reports multiple psychiatric hospitlaizations due to depression and suicidal ideations with thel last one occuring in 1997. Patient has participated in outpatient psychotherapy and medication management intermittently since age 83.  She currently is seeing psychiatrist Dr. Harrington Challenger . Prior to this, she was being seen at [facility]. Patient also has had ECT at [facility].    Interventions Strategy:  Supportive  Participation Level:   Active      Participation Quality:  Appropriate      Behavioral Observation:  Casual, well groomed, speech very slow, drowsy   Current Psychosocial Factors: Tension in relationship with son, multiple health issues, concerns about grandson,   Content of Session:   reviewed symptoms, assisted patient identify stressors and  triggers of increased depressed mood, facilitated expression of thoughts and  feelings, validated feelings, assisted discussed realistic expectations versus unrealistic demands regarding her relationship with her son and grandchildren, use cognitive reappraisal, to assist patient identify/challenge/and replace unhelpful thoughts about the relationship with helpful thoughts, assisted patient use spirituality to develop coping statements   Current Status:   Fluctuations in mood, worry,     Suicidal/Homicidal:    No   Patient Progress:   Patient last was seen about 2 months  ago. She reports continued stress regarding her health as she continues to fall. She reports increased frustration and anger with son about his relationship with his son. She expresses continued sadness that they don't have the relationship she thinks they should have. She also expresses hurt regarding the relationship with her granddaughter who does not want to spend as much time with patient as she wishes. She denies hallucinations but reports continued occasional bad dreams about deceased people.  Target Goals:   1.  Learn and implement behavioral strategies to overcome depression.    2.  Verbalize an understanding and resolution of current interpersonal problems.   Last Reviewed:   10/13/2016  Goals Addressed Today:    1,2  Plan:      Return again in 2 - 3 weeks.  Impression/Diagnosis:   Patient presents with long standing history of recurrent periods  of depression beginning when she was [age] and her favorite uncle died. Patient reports multiple psychiatric hospitlaizations due to depression and suicidal ideations with thel last one occuring in 1997. Patient has participated in outpatient psychotherapy and medication management intermittently since age 21.  She currently is seeing psychiatrist Dr. Harrington Challenger . Prior to this, she was being seen at [facility]. Patient also has had ECT at [facility]. Symptoms have worsened in recent months due to family stress and have  included depressed mood, anxiety,  excessive worry, and tearfulness.   Diagnosis:  Axis I: MDD recurrent episode, moderate          Axis II: Deferred     [name] Davisson, LCSW 09/03/2017

## 2017-09-04 ENCOUNTER — Telehealth: Payer: Self-pay | Admitting: Family Medicine

## 2017-09-04 NOTE — Telephone Encounter (Signed)
Patient wants to know where you sent the prescription for her lift chair. [phone]/ [phone]

## 2017-09-04 NOTE — Telephone Encounter (Signed)
I sent it to the fax number Arielis provided. This is the message that she left [name] that was sent to me.  FAX rx for lift Chair to FAX # [phone] this is for [facility]

## 2017-09-09 NOTE — Assessment & Plan Note (Signed)
She doesn't note significant cough, wheeze or dyspnea. Pretty casual with meds- not motivated by symptoms to worry about this. I tried to explain to her.

## 2017-09-09 NOTE — Assessment & Plan Note (Signed)
I'm not sure her mental acuity is enough to make CPAP work for her. The epistaxis is another issue. We discussed nasal saline gel. Discussed alternative masks, and oral appliances.

## 2017-09-09 NOTE — Assessment & Plan Note (Signed)
She isn't much impacted by my efforts to motivate smoking cessation. I will continue.

## 2017-09-10 ENCOUNTER — Ambulatory Visit
Admission: RE | Admit: 2017-09-10 | Discharge: 2017-09-10 | Disposition: A | Discharge status: Home / Self Care | Payer: Medicare HMO | Source: Ambulatory Visit | Attending: Family Medicine | Admitting: Family Medicine

## 2017-09-10 DIAGNOSIS — Z794 Long term (current) use of insulin: Secondary | ICD-10-CM | POA: Diagnosis not present

## 2017-09-10 DIAGNOSIS — I10 Essential (primary) hypertension: Secondary | ICD-10-CM | POA: Diagnosis not present

## 2017-09-10 DIAGNOSIS — I69354 Hemiplegia and hemiparesis following cerebral infarction affecting left non-dominant side: Secondary | ICD-10-CM | POA: Diagnosis not present

## 2017-09-10 DIAGNOSIS — J449 Chronic obstructive pulmonary disease, unspecified: Secondary | ICD-10-CM | POA: Diagnosis not present

## 2017-09-10 DIAGNOSIS — Z1231 Encounter for screening mammogram for malignant neoplasm of breast: Secondary | ICD-10-CM

## 2017-09-10 DIAGNOSIS — I509 Heart failure, unspecified: Secondary | ICD-10-CM | POA: Diagnosis not present

## 2017-09-10 DIAGNOSIS — E1142 Type 2 diabetes mellitus with diabetic polyneuropathy: Secondary | ICD-10-CM | POA: Diagnosis not present

## 2017-09-10 DIAGNOSIS — I110 Hypertensive heart disease with heart failure: Secondary | ICD-10-CM | POA: Diagnosis not present

## 2017-09-10 DIAGNOSIS — Z87891 Personal history of nicotine dependence: Secondary | ICD-10-CM | POA: Diagnosis not present

## 2017-09-10 DIAGNOSIS — F250 Schizoaffective disorder, bipolar type: Secondary | ICD-10-CM | POA: Diagnosis not present

## 2017-09-17 ENCOUNTER — Ambulatory Visit: Payer: Medicare HMO | Admitting: Adult Health

## 2017-09-17 ENCOUNTER — Other Ambulatory Visit: Payer: Self-pay

## 2017-09-17 DIAGNOSIS — R296 Repeated falls: Secondary | ICD-10-CM

## 2017-09-17 MED ORDER — UNABLE TO FIND: 0 refills | Status: DC

## 2017-09-18 ENCOUNTER — Ambulatory Visit (INDEPENDENT_AMBULATORY_CARE_PROVIDER_SITE_OTHER): Payer: Medicare HMO | Admitting: Psychiatry

## 2017-09-18 DIAGNOSIS — F331 Major depressive disorder, recurrent, moderate: Secondary | ICD-10-CM

## 2017-09-18 NOTE — Progress Notes (Signed)
Patient:  [name]                DOB: 1950-12-14          MR Number:  [id number]  Location:   [place]:  [address] [place],  [place],    [place]          Start:  Tuesday 09/18/2017 2;15 PM  End:       Tuesday 09/18/2017 3:00 PM          Provider/Observer:     Maurice Small, MSW, LCSW   Chief Complaint:                                                    Depression  Reason For Service:               [name] is a 67 y.o. female who presents with a long standing history of recurrent periods of depression beginning when she was 29 and her favorite uncle died. She h         as been experiencing increased symptoms of depression recently due to stress in the relationship with her son. Per patient's report, he rarely visits her anymore due to his involvement with an older woman who is controlling. She also reports frustration regarding her knees who is her power of attorn   ey and is very demanding perpatient's report. She reports issues with other family members and states she needs help dealing with her family Patient reports multiple psychiatric hospitlaizations due to depression and suicidal ideations with thel last one occuring in 1997. Patient has participated in outpatient psychotherapy and medication management intermittently since age 21.  She currently is seeing psychiatrist Dr. Harrington Challenger . Prior to this, she was being seen at [facility]. Patient also has had ECT at [facility].    Interventions Strategy:  Supportive  Participation Level:   Active      Participation Quality:  Appropriate      Behavioral Observation:  Casual, well groomed, speech very slow, drowsy   Current Psychosocial Factors: Tension in relationship with son, multiple health issues, concerns about grandson,   Content of Session:   reviewed symptoms, administered PHQ-9, assisted patient identify stressors and  triggers of increased depressed mood, facilitated expression of  thoughts and feelings, validated feelings, praised and reinforced patient reaching out to member of support system, praised and reinforced patient's use of coping statements,  assisted patient identify ways to increase social contact, assisted patient identify activities that may increase laughter in her life (read funny story, look at funny content on TV), assigned patient to implement strategies discussed in session  Current Status:   Fluctuations in mood, worry,     Suicidal/Homicidal:    No   Patient Progress:   Patient last was seen about 2-3 weeks ago.  She reports having a difficult night last night due to being lonely. She reports having passive SI with no intent and no plan. She reports using coping statements developed from Psalm 37. She also called a friend and says this was helpful. She reports trying to let go of situation between son and her grandson. She also has reduced contact with  son as she says she is the one who tends to initiate contact.   Target Goals:   1.  Learn and implement behavioral strategies to overcome depression.    2.  Verbalize an understanding and resolution of current interpersonal problems.   Last Reviewed:   10/13/2016  Goals Addressed Today:    1,2  Plan:      Return again in 2 - 3 weeks.  Impression/Diagnosis:   Patient presents with long standing history of recurrent periods  of depression beginning when she was [age] and her favorite uncle died. Patient reports multiple psychiatric hospitlaizations due to depression and suicidal ideations with thel last one occuring in 1997. Patient has participated in outpatient psychotherapy and medication management intermittently since age 84.  She currently is seeing psychiatrist Dr. Harrington Challenger . Prior to this, she was being seen at [facility]. Patient also has had ECT at [facility]. Symptoms have worsened in recent months due to family stress and have  included depressed mood, anxiety, excessive worry, and tearfulness.    Diagnosis:                                Axis I: MDD recurrent episode, moderate          Axis II: Deferred     [name] Leon, LCSW 09/18/2017

## 2017-09-21 ENCOUNTER — Ambulatory Visit: Payer: Medicare HMO | Admitting: Podiatry

## 2017-09-24 DIAGNOSIS — E1142 Type 2 diabetes mellitus with diabetic polyneuropathy: Secondary | ICD-10-CM | POA: Diagnosis not present

## 2017-09-24 DIAGNOSIS — I110 Hypertensive heart disease with heart failure: Secondary | ICD-10-CM | POA: Diagnosis not present

## 2017-09-24 DIAGNOSIS — J449 Chronic obstructive pulmonary disease, unspecified: Secondary | ICD-10-CM | POA: Diagnosis not present

## 2017-09-24 DIAGNOSIS — F250 Schizoaffective disorder, bipolar type: Secondary | ICD-10-CM | POA: Diagnosis not present

## 2017-09-24 DIAGNOSIS — I509 Heart failure, unspecified: Secondary | ICD-10-CM | POA: Diagnosis not present

## 2017-09-24 DIAGNOSIS — I69354 Hemiplegia and hemiparesis following cerebral infarction affecting left non-dominant side: Secondary | ICD-10-CM | POA: Diagnosis not present

## 2017-09-24 DIAGNOSIS — Z87891 Personal history of nicotine dependence: Secondary | ICD-10-CM | POA: Diagnosis not present

## 2017-09-24 DIAGNOSIS — Z794 Long term (current) use of insulin: Secondary | ICD-10-CM | POA: Diagnosis not present

## 2017-09-25 DIAGNOSIS — M542 Cervicalgia: Secondary | ICD-10-CM | POA: Diagnosis not present

## 2017-09-25 DIAGNOSIS — M25512 Pain in left shoulder: Secondary | ICD-10-CM | POA: Diagnosis not present

## 2017-09-26 ENCOUNTER — Other Ambulatory Visit: Payer: Self-pay

## 2017-09-26 NOTE — Patient Outreach (Signed)
[facility]) Care Management  [place]  09/26/2017   Demeka Sutter Cua [birth year] [id number]  Subjective: Telephone call placed to the patient for assessment.  HIPAA verified.  The patient states that she woke up this morning and has a headache. She rates the pain 8/10. She is unsure if the headache is coming from her blood pressure or the pain  in her neck. Her blood pressure this morning was 210/85.  She saw her Orthopedic physician yesterday and she states that they feel that she may have some nerve compression causing her pain.  They are scheduling her to have an MRI.  She states at that point she may have surgery or injections to help with the pain.  The patient states that she is adherent with her medications but she is running low on certain medications and waiting for delivery.  I advised the patient to call her nurse from [facility] to come to the home and fill the medication box with her new meds.  She verbalized understanding and stated she will call. The patient states that her pain medication does not help with the pain and she is going to call her pain management physician. I discussed with the patient about diet and encouraged her to continue to check her blood pressure daily.  She denies flushing blurred vision or chest pain.  She is currently walking with a walker for stability.  She states that she has not had any falls. She states that her next appointment to see her PCP will be in September.  Encounter Medications:  Outpatient Encounter Medications as of 09/26/2017  Medication Sig  . ACCU-CHEK SMARTVIEW test strip TEST BLOOD SUGAR FOUR TIMES DAILY AS DIRECTED  . albuterol (PROVENTIL HFA;VENTOLIN HFA) 108 (90 Base) MCG/ACT inhaler Inhale 1-2 puffs every 6 hours as needed for wheezing, shortness of breath  . ascorbic acid (VITAMIN C) 500 MG tablet Take 500 mg by mouth daily.  [name] aspirin EC 81 MG tablet Take 81 mg by mouth daily.  . cetirizine (ZYRTEC) 10 MG tablet  Take 1 tablet (10 mg total) by mouth daily.  . Cholecalciferol (VITAMIN D3) 5000 units CAPS Take 1 capsule by mouth daily.  . cloNIDine (CATAPRES) 0.3 MG tablet TAKE 1 TABLET BY MOUTH EVERY EIGHT HOURS. ONCE AT 8AM, 4PM, AND 12 MIDNIGHT.  [name] diclofenac sodium (VOLTAREN) 1 % GEL Apply 2 g topically daily as needed (pain). Rubs "wherever there is pain" when she doesn't take her pain pill.  . dicyclomine (BENTYL) 10 MG capsule Take 1 tab by mouth twice daily as needed for abdominal cramping.  . docusate sodium (COLACE) 100 MG capsule Take 100 mg by mouth 2 (two) times daily.  . hydroxychloroquine (PLAQUENIL) 200 MG tablet Take 200 mg by mouth daily.   . insulin aspart (NOVOLOG FLEXPEN) 100 UNIT/ML FlexPen INJECT 10 TO 14 UNITS INTO THE SKIN 3 (THREE) TIMES DAILY WITH MEALS.  [name] Insulin Glargine (TOUJEO SOLOSTAR) 300 UNIT/ML SOPN Inject 20 Units into the skin at bedtime.  . lamoTRIgine (LAMICTAL) 100 MG tablet Take 1 tablet (100 mg total) by mouth 2 (two) times daily.  [name] levothyroxine (SYNTHROID, LEVOTHROID) 50 MCG tablet TAKE 1 TABLET BY MOUTH ONCE DAILY AND 1/2 TABLET ON SUNDAYS.  [name] LORazepam (ATIVAN) 0.5 MG tablet Take 1 tablet (0.5 mg total) by mouth 3 (three) times daily.  [name] losartan (COZAAR) 50 MG tablet Take 1 tablet (50 mg total) by mouth daily.  . meclizine (ANTIVERT) 25 MG tablet Take 25  mg by mouth 3 (three) times daily as needed for dizziness. 07/10/17 Has medication and reports not taking often,  but says she takes if dizzy.  . montelukast (SINGULAIR) 10 MG tablet TAKE ONE TABLET BY MOUTH ONCE DAILY.  . Multiple Vitamins-Minerals (HM MULTIVITAMIN ADULT GUMMY PO) Take 1 tablet by mouth daily.  [name] nystatin (NYAMYC) powder APPLY TO AFFECTED AREA 4 TIMES DAILY.  [name] polyethylene glycol (MIRALAX) packet Take 17 g by mouth daily.  . pregabalin (LYRICA) 75 MG capsule Take 1 capsule (75 mg total) by mouth daily.  . RABEprazole (ACIPHEX) 20 MG tablet Take 1 tablet (20 mg total) by mouth 2 (two) times  daily.  . RESTASIS 0.05 % ophthalmic emulsion Place 1 drop into both eyes 2 (two) times daily.   . risperiDONE (RISPERDAL) 0.5 MG tablet Take 1 tablet (0.5 mg total) by mouth at bedtime.  . rosuvastatin (CRESTOR) 5 MG tablet TAKE 1 TABLET BY MOUTH AT BEDTIME.  [name] sertraline (ZOLOFT) 100 MG tablet Take 1 tablet (100 mg total) by mouth daily.  . traZODone (DESYREL) 100 MG tablet Take 1 tablet (100 mg total) by mouth at bedtime.  . triamcinolone ointment (KENALOG) 0.1 % Apply 1 application topically at bedtime. Apply to elbows at bedtime.  [name] UNABLE TO FIND Lift chair x 1  DX  . lidocaine (XYLOCAINE) 2 % jelly APPLY TO LEFT ABDOMINAL WALL TWICE DAILY. (Patient not taking: Reported on 08/23/2017)  . metoprolol tartrate (LOPRESSOR) 50 MG tablet TAKE 1 TABLET BY MOUTH TWICE DAILY.  [name] NYAMYC powder APPLY TO AFFECTED AREA FOUR TIMES DAILY. (Patient not taking: Reported on 09/26/2017)  . oxyCODONE-acetaminophen (PERCOCET/ROXICET) 5-325 MG tablet Take by mouth as needed.   No facility-administered encounter medications on file as of 09/26/2017.     Functional Status:  In your present state of health, do you have any difficulty performing the following activities: 04/30/2017  Hearing? N  Vision? N  Difficulty concentrating or making decisions? N  Walking or climbing stairs? Y  Dressing or bathing? Y  Doing errands, shopping? Y  Preparing Food and eating ? N  Using the Toilet? N  In the past six months, have you accidently leaked urine? Y  Do you have problems with loss of bowel control? N  Managing your Medications? Y  Managing your Finances? Y  Housekeeping or managing your Housekeeping? Y  Some recent data might be hidden    Fall/Depression Screening: Fall Risk  09/26/2017 08/08/2017 07/26/2017  Falls in the past year? No Yes Yes  Number falls in past yr: - 2 or more 2 or more  Injury with Fall? - - Yes  Comment - - -  Risk Factor Category  - High Fall Risk High Fall Risk  Risk for fall due  to : - Impaired balance/gait;Impaired mobility History of fall(s);Impaired mobility  Follow up - - -   PHQ 2/9 Scores 08/08/2017 06/29/2017 06/28/2017 04/30/2017 04/11/2017 12/06/2016 09/25/2016  PHQ - 2 Score 2 1 1 1 1 2 2   PHQ- 9 Score 5 - - - - 5 9  Some encounter information is confidential and restricted. Go to Review Flowsheets activity to see all data.    Assessment: Patient will continue to benefit from health coach outreach for disease management and support.  Plan: RN Health Coach will contact patient in the month of August and patient agrees to next outreach.  [name] RN, BSN, [place] Direct Dial:  [phone]  Fax:  [phone]      

## 2017-09-27 ENCOUNTER — Telehealth: Payer: Self-pay | Admitting: Family Medicine

## 2017-09-27 ENCOUNTER — Other Ambulatory Visit: Payer: Self-pay

## 2017-09-27 ENCOUNTER — Ambulatory Visit: Payer: Self-pay | Admitting: Family Medicine

## 2017-09-27 MED ORDER — CLONIDINE HCL 0.3 MG PO TABS: ORAL_TABLET | ORAL | 0 refills | Status: DC

## 2017-09-27 NOTE — Telephone Encounter (Signed)
Patient notified

## 2017-09-27 NOTE — Telephone Encounter (Signed)
Medication refilled for one month and sent to [place] [facility] per patient request.

## 2017-09-27 NOTE — Telephone Encounter (Signed)
Pt is completely out of BP Clonidine, please call her in a 30 day supply to [place], it will take [employer] 7 days to get to her

## 2017-09-28 DIAGNOSIS — I69354 Hemiplegia and hemiparesis following cerebral infarction affecting left non-dominant side: Secondary | ICD-10-CM | POA: Diagnosis not present

## 2017-09-28 DIAGNOSIS — I110 Hypertensive heart disease with heart failure: Secondary | ICD-10-CM | POA: Diagnosis not present

## 2017-09-28 DIAGNOSIS — Z794 Long term (current) use of insulin: Secondary | ICD-10-CM | POA: Diagnosis not present

## 2017-09-28 DIAGNOSIS — Z87891 Personal history of nicotine dependence: Secondary | ICD-10-CM | POA: Diagnosis not present

## 2017-09-28 DIAGNOSIS — I509 Heart failure, unspecified: Secondary | ICD-10-CM | POA: Diagnosis not present

## 2017-09-28 DIAGNOSIS — E1142 Type 2 diabetes mellitus with diabetic polyneuropathy: Secondary | ICD-10-CM | POA: Diagnosis not present

## 2017-09-28 DIAGNOSIS — F250 Schizoaffective disorder, bipolar type: Secondary | ICD-10-CM | POA: Diagnosis not present

## 2017-09-28 DIAGNOSIS — J449 Chronic obstructive pulmonary disease, unspecified: Secondary | ICD-10-CM | POA: Diagnosis not present

## 2017-09-30 ENCOUNTER — Other Ambulatory Visit: Payer: Self-pay | Admitting: Family Medicine

## 2017-09-30 MED ORDER — CLONIDINE HCL 0.3 MG PO TABS: ORAL_TABLET | ORAL | 3 refills | Status: DC

## 2017-09-30 NOTE — Telephone Encounter (Signed)
Noted , thanks, and medication sent in for 90 day su[pply with refills to [facility]

## 2017-09-30 NOTE — Progress Notes (Signed)
cl

## 2017-10-04 DIAGNOSIS — M542 Cervicalgia: Secondary | ICD-10-CM | POA: Diagnosis not present

## 2017-10-08 ENCOUNTER — Telehealth: Payer: Self-pay | Admitting: Family Medicine

## 2017-10-08 ENCOUNTER — Ambulatory Visit (INDEPENDENT_AMBULATORY_CARE_PROVIDER_SITE_OTHER): Payer: Medicare HMO | Admitting: Internal Medicine

## 2017-10-08 ENCOUNTER — Other Ambulatory Visit: Payer: Self-pay

## 2017-10-08 ENCOUNTER — Encounter: Payer: Self-pay | Admitting: Internal Medicine

## 2017-10-08 ENCOUNTER — Encounter: Payer: Self-pay | Admitting: Family Medicine

## 2017-10-08 ENCOUNTER — Ambulatory Visit (INDEPENDENT_AMBULATORY_CARE_PROVIDER_SITE_OTHER): Payer: Medicare HMO | Admitting: Family Medicine

## 2017-10-08 VITALS — BP 104/46 | HR 70 | Resp 12 | Ht 59.0 in | Wt 145.0 lb

## 2017-10-08 VITALS — BP 118/60 | HR 70 | Ht 59.0 in | Wt 156.2 lb

## 2017-10-08 DIAGNOSIS — E1142 Type 2 diabetes mellitus with diabetic polyneuropathy: Secondary | ICD-10-CM | POA: Diagnosis not present

## 2017-10-08 DIAGNOSIS — M542 Cervicalgia: Secondary | ICD-10-CM | POA: Diagnosis not present

## 2017-10-08 DIAGNOSIS — F17210 Nicotine dependence, cigarettes, uncomplicated: Secondary | ICD-10-CM | POA: Diagnosis not present

## 2017-10-08 DIAGNOSIS — M4716 Other spondylosis with myelopathy, lumbar region: Secondary | ICD-10-CM | POA: Diagnosis not present

## 2017-10-08 DIAGNOSIS — E039 Hypothyroidism, unspecified: Secondary | ICD-10-CM | POA: Diagnosis not present

## 2017-10-08 DIAGNOSIS — M5442 Lumbago with sciatica, left side: Secondary | ICD-10-CM

## 2017-10-08 DIAGNOSIS — N3090 Cystitis, unspecified without hematuria: Secondary | ICD-10-CM

## 2017-10-08 DIAGNOSIS — I509 Heart failure, unspecified: Secondary | ICD-10-CM | POA: Diagnosis not present

## 2017-10-08 DIAGNOSIS — E042 Nontoxic multinodular goiter: Secondary | ICD-10-CM | POA: Diagnosis not present

## 2017-10-08 DIAGNOSIS — G8929 Other chronic pain: Secondary | ICD-10-CM

## 2017-10-08 DIAGNOSIS — I69354 Hemiplegia and hemiparesis following cerebral infarction affecting left non-dominant side: Secondary | ICD-10-CM | POA: Diagnosis not present

## 2017-10-08 DIAGNOSIS — I952 Hypotension due to drugs: Secondary | ICD-10-CM

## 2017-10-08 DIAGNOSIS — I110 Hypertensive heart disease with heart failure: Secondary | ICD-10-CM | POA: Diagnosis not present

## 2017-10-08 DIAGNOSIS — E1165 Type 2 diabetes mellitus with hyperglycemia: Secondary | ICD-10-CM

## 2017-10-08 DIAGNOSIS — F060 Psychotic disorder with hallucinations due to known physiological condition: Secondary | ICD-10-CM

## 2017-10-08 DIAGNOSIS — Z87891 Personal history of nicotine dependence: Secondary | ICD-10-CM | POA: Diagnosis not present

## 2017-10-08 DIAGNOSIS — Z794 Long term (current) use of insulin: Secondary | ICD-10-CM | POA: Diagnosis not present

## 2017-10-08 DIAGNOSIS — F250 Schizoaffective disorder, bipolar type: Secondary | ICD-10-CM | POA: Diagnosis not present

## 2017-10-08 DIAGNOSIS — J449 Chronic obstructive pulmonary disease, unspecified: Secondary | ICD-10-CM | POA: Diagnosis not present

## 2017-10-08 LAB — POCT URINALYSIS DIPSTICK
Bilirubin, UA: NEGATIVE
Blood, UA: NEGATIVE
Glucose, UA: NEGATIVE
Ketones, UA: NEGATIVE
NITRITE UA: NEGATIVE
PROTEIN UA: NEGATIVE
Urobilinogen, UA: 0.2 E.U./dL
pH, UA: 6 (ref 5.0–8.0)

## 2017-10-08 LAB — T4, FREE: FREE T4: 0.98 ng/dL (ref 0.60–1.60)

## 2017-10-08 LAB — TSH: TSH: 0.52 u[IU]/mL (ref 0.35–4.50)

## 2017-10-08 MED ORDER — UNABLE TO FIND: 11 refills | Status: DC

## 2017-10-08 NOTE — Patient Instructions (Addendum)
Keep wellness and physical appts as before  We will send urine for culture and treat if appropriate  Your blood pressure is LOW reduce CLONIDINE to tWO times daily one at 8 am  And one at 8 pm  I will refer you to Dr Rita Ohara  'You will get signed off for lift chair but should have PT assessment to ascertain the need, we will work on this   Work on quitting cigarettes, when you use inhaler, no smoking

## 2017-10-08 NOTE — Telephone Encounter (Signed)
No

## 2017-10-08 NOTE — Telephone Encounter (Signed)
Is this the right patient?

## 2017-10-08 NOTE — Progress Notes (Signed)
Patient ID: [name], female   DOB: 12/20/1950, 67 y.o.   MRN: [id number]  HPI: [name] is a 67 y.o.-year-old female, initially referred by her PCP, [name], for management of DM2, dx 1995, insulin-dependent since 1997, uncontrolled, with complications (gastroparesis, cerebrovasc. Ds-  H/o stroke, PN),  Hypothyroidism, thyroid nodules.  Last visit 4 months ago.  She thinks she had a UTI. Results of UCx pending. She also had a steroid inj last week and one 2 mo ago.  Sugars are higher in the last week  DM2: Last hemoglobin A1c was: Lab Results  Component Value Date   HGBA1C 5.8 06/07/2017   HGBA1C 6.0 (H) 02/05/2017   HGBA1C 6.3 (H) 10/15/2016  10/28/2013: HbA1c 8.5%  She is on: - Toujeo 25 >> 30 >> 20 units at bedtime - Novolog:  10 units with a smaller meal  14 units with a larger meal or with lunch Could not tolerate Metformin >> nausea.  Insulin doses were decreased in 04/2017 due to low blood sugars in the 40s.  Pt checks her sugars 4x a day -per review of her carefully kept log: - am:77, 86-169, 318 >> 61-134, 188 >> 81-157, 200, 270 (UTI) - 2h after b'fast: n/c >> 110 >> n/c - before lunch: 81-160, 254, 481 >> 60-137, 210 >> 66, 80-155, 170, 366 - 2h after lunch: n/c >> 127-154, 229 >> n/c - before dinner: 98-245 >> 46 x1, 60-159, 191, 200 >> 95-151, 219 - 2h after dinner: n/c - bedtime: 68, 89-190, 217, 252 >> 65, 71, 101-190, 244 >> 79, 100-179, 211 - nighttime: n/c Lowest sugar was 77 >> 46 x1 >> 66; she has hypoglycemia awareness  in the 70s. Highest sugar was 482 ([place]) >> 244 >> 366 x1.  Pt's meals are: - Breakfast: eggs, cereals, fruit, oatmeal, bacon - Lunch: sandwich, beef, mashed potatoes,diet soda - Dinner: chicken, beef, fish, potatoes, vegetables - Snacks: 1-2: fruit  - + Mild CKD, last BUN/creatinine:  Lab Results  Component Value Date   BUN 24 (H) 06/27/2017   CREATININE 1.09 (H) 06/27/2017  On losartan. -+ HL; last set of  lipids: Lab Results  Component Value Date   CHOL 175 02/05/2017   HDL 67 02/05/2017   LDLCALC 87 02/05/2017   TRIG 105 02/05/2017   CHOLHDL 3.1 07/19/2015  On Crestor and Zetia. - last eye exam was in 03/2017: + DR Had cataract sx B. Dr. Hassell Done [facility]). -+ Numbness and tingling mainly in her left leg (affected by stroke).  sees podiatry.  Hypothyroidism: - dx many years ago On levothyroxine 50 mcg 6 out of 7 days and 25 mcg 1 out of 7 days: - in am - fasting - at least 30 min from b'fast - no Ca, Fe, MVI - + Aciphex at lunchtime and bedtime - not on Biotin  Latest TSH levels normal: Lab Results  Component Value Date   TSH 0.827 10/15/2016   TSH 0.906 01/22/2016   TSH 1.030 11/03/2015   TSH 1.207 02/21/2015   TSH 1.352 11/09/2014   Thyroid nodules.    Pt denies: - feeling nodules in neck - hoarseness - choking - SOB with lying down She has dysphagia with certain foods, which is chronic.  She had previous esophageal dilations.  Reviewed her imaging and biopsy tests: thyroid U/S (10/2013):  Right thyroid lobe: 3.7 x 1.0 x 1.8 cm   Left thyroid lobe: 3.5 x 1.5 x 1.6 cm   Isthmus: 0.5 cm  Focal nodules: There is a 0.3 mm calcified nodule in the right midzone. There is a 0.6 x 0.7 x 0.5 cm hypoechoic nodule in the  lateral aspect of the right midzone. There is a 0.7 x 0.6 x 0.5 cm hyperechoic nodule in the lateral aspect of the right lower pole.  There is a 2.2 x 1.1 x 1.7 cm complex solid nodule in the inferior aspect of the isthmus extending adjacent to the left lower pole.  There is a 1.4 x 2.1 x 1.4 cm complex solid nodule in the left lower pole.   Lymphadenopathy: None visualized.  Repeat U/S (01/2014): Stable appearance of the nodules  FNA (2014) x2: Benign  Repeat U/S (10/2015): Stable appearance of the nodules  She had a L rib fracture in 12/2014.  Patient was admitted with AMS + sepsis on 10/15/2016. She had aspiration pneumonia and acute respiratory  failure.   ROS: Constitutional: no weight gain/no weight loss, no fatigue, no subjective hyperthermia, no subjective hypothermia Eyes: no blurry vision, no xerophthalmia ENT: no sore throat, + see HPI Cardiovascular: no CP/no SOB/no palpitations/no leg swelling Respiratory: no cough/no SOB/no wheezing Gastrointestinal: no N/no V/no D/no C/no acid reflux Musculoskeletal: no muscle aches/no joint aches Skin: no rashes, no hair loss Neurological: no tremors/no numbness/no tingling/no dizziness  I reviewed pt's medications, allergies, PMH, social hx, family hx, and changes were documented in the history of present illness. Otherwise, unchanged from my initial visit note.  Past Medical History:  Diagnosis Date  . Anemia   . Anxiety    takes Ativan daily  . Arthritis   . Bipolar disorder ([place])    takes Risperdal nightly  . Blood transfusion   . Cancer ([place])    In her gum  . Carpal tunnel syndrome of right wrist 05/23/2011  . Cervical disc disorder with radiculopathy of cervical region 10/31/2012  . Chronic back pain   . Chronic idiopathic constipation   . Colon polyps   . COPD (chronic obstructive pulmonary disease) with chronic bronchitis ([place]) 09/16/2013   Office Spirometry 10/30/2013-submaximal effort based on appearance of loop and curve. Numbers would fit with severe restriction but her physiologic capability may be better than this. FVC 0.91/44%, and 10.74/45%, FEV1/FVC 0.81, FEF 25-75% 1.43/69%    . Depression    takes Zoloft daily  . Diabetes mellitus    Type II  . Diverticulosis    TCS 9/08 by Dr. Delfin Edis for diarrhea . Bx for micro scopic colitis negative.   . Fibromyalgia   . Frequent falls   . GERD (gastroesophageal reflux disease)    takes Aciphex daily  . Glaucoma    eye drops daily  . Gum symptoms    infection on antibiotic  . Hemiplegia affecting non-dominant side, post-stroke 08/02/2011  . Hyperlipidemia    takes Crestor daily  . Hypertension    takes  Amlodipine,Metoprolol,and Clonidine daily  . Hypothyroidism    takes Synthroid daily  . IBS (irritable bowel syndrome)   . Insomnia    takes Trazodone nightly  . 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 Metabolic encephalopathy 08/15/5407  . Migraines    chronic headaches  . Mononeuritis lower limb   . Narcolepsy   . Osteoporosis   . Pancreatitis 2006   due to Depakote with normal EUS   . Schatzki's ring    non critical / EGD with ED 8/2011with RMR  . Seizures ([place])    takes Lamictal daily.Last seizure 3 yrs ago  . Sleep apnea  on CPAP  . Stroke [facility])    left sided weakness  . Tubular adenoma of colon    Past Surgical History:  Procedure Laterality Date  . ABDOMINAL HYSTERECTOMY  1978  . BACK SURGERY  July 2012  . BACTERIAL OVERGROWTH TEST N/A 05/05/2013   Procedure: BACTERIAL OVERGROWTH TEST;  Surgeon: Daneil Dolin, MD;  [facility];  Service: Endoscopy;  Laterality: N/A;  7:30  . BIOPSY THYROID  2009  . BRAIN SURGERY  11/2011   resection of meningioma  . BREAST REDUCTION SURGERY  1994  . CARDIAC CATHETERIZATION  05/10/2005   normal coronaries, normal LV systolic function and EF (Dr. Jackie Plum)  . CARPAL TUNNEL RELEASE Left 07/22/04   Dr. Aline Brochure  . CATARACT EXTRACTION Bilateral   . CHOLECYSTECTOMY  1984  . COLONOSCOPY N/A 09/25/2012   ASN:[id number] diverticulosis.  colonic polyp-removed : tubular adenoma  . CRANIOTOMY  11/23/2011   Procedure: CRANIOTOMY TUMOR EXCISION;  Surgeon: Hosie Spangle, MD;  Location: [place] NEURO ORS;  Service: Neurosurgery;  Laterality: N/A;  Craniotomy for tumor resection  . ESOPHAGOGASTRODUODENOSCOPY  12/29/2010   Rourk-Retained food in the esophagus and stomach, small hiatal hernia, status post Maloney dilation of the esophagus  . ESOPHAGOGASTRODUODENOSCOPY N/A 09/25/2012   HAL:[id number] atonic baggy esophagus status post Maloney dilation 46 F. Hiatal hernia  . GIVENS CAPSULE STUDY N/A 01/15/2013   NORMAL.   . IR GENERIC HISTORICAL   03/17/2016   IR RADIOLOGIST EVAL & MGMT 03/17/2016 [facility]-INTERV RAD  . LESION REMOVAL N/A 05/31/2015   Procedure: REMOVAL RIGHT AND LEFT LESIONS OF MANDIBLE;  Surgeon: Diona Browner, DDS;  Location: [place];  Service: Oral Surgery;  Laterality: N/A;  . MALONEY DILATION  12/29/2010   RMR;  . NM MYOCAR PERF WALL MOTION  2006   "relavtiely normal" persantine, mild anterior thinning (breast attenuation artifact), no region of scar/ischemia  . OVARIAN CYST REMOVAL    . RECTOCELE REPAIR N/A 06/29/2015   Procedure: POSTERIOR REPAIR (RECTOCELE);  Surgeon: Jonnie Kind, MD;  Location: [facility];  Service: Gynecology;  Laterality: N/A;  . REDUCTION MAMMAPLASTY Bilateral   . SPINE SURGERY  09/29/2010   Dr. Rolena Infante  . surgical excision of 3 tumors from right thigh and right buttock  and left upper thigh  2010  . TOOTH EXTRACTION Bilateral 12/14/2014   Procedure: REMOVAL OF BILATERAL MANDIBULAR EXOSTOSES;  Surgeon: Diona Browner, DDS;  Location: [place];  Service: Oral Surgery;  Laterality: Bilateral;  . TRANSTHORACIC ECHOCARDIOGRAM  2010   EF 60-65%, mild conc LVH, grade 1 diastolic dysfunction; mildly calcified MV annulus with mildly thickened leaflets, mildly calcified MR annulus   History   Social History  . Marital Status: Divorced    Spouse Name: N/A    Number of Children: 1  . Years of Education: 12   Occupational History  . disabled     Social History Main Topics  . Smoking status: Current Every Day Smoker -- 0.25 packs/day for 7 years    Types: Cigarettes  . Smokeless tobacco: Never Used     Comment: "started back but off now for 3 months" (08/18/13)  . Alcohol Use: No     Comment:    . Drug Use: No   Current Outpatient Medications on File Prior to Visit  Medication Sig Dispense Refill  . ACCU-CHEK SMARTVIEW test strip TEST BLOOD SUGAR FOUR TIMES DAILY AS DIRECTED 350 each 2  . albuterol (PROVENTIL HFA;VENTOLIN HFA) 108 (90 Base) MCG/ACT inhaler Inhale 1-2  puffs every 6 hours as needed for  wheezing, shortness of breath 3 Inhaler 3  . ascorbic acid (VITAMIN C) 500 MG tablet Take 500 mg by mouth daily.    [name] aspirin EC 81 MG tablet Take 81 mg by mouth daily.    . cetirizine (ZYRTEC) 10 MG tablet Take 1 tablet (10 mg total) by mouth daily. 7 tablet 0  . Cholecalciferol (VITAMIN D3) 5000 units CAPS Take 1 capsule by mouth daily.    . cloNIDine (CATAPRES) 0.3 MG tablet TAKE 1 TABLET BY MOUTH EVERY EIGHT HOURS. ONCE AT 8AM, 4PM, AND 12 MIDNIGHT. 90 tablet 0  . cloNIDine (CATAPRES) 0.3 MG tablet Take one tablet by mouth three times daily at 8 am , 4 pm and 10 pm 270 tablet 3  . diclofenac sodium (VOLTAREN) 1 % GEL Apply 2 g topically daily as needed (pain). Rubs "wherever there is pain" when she doesn't take her pain pill. 100 g 0  . dicyclomine (BENTYL) 10 MG capsule Take 1 tab by mouth twice daily as needed for abdominal cramping. 60 capsule 6  . docusate sodium (COLACE) 100 MG capsule Take 100 mg by mouth 2 (two) times daily.    . hydroxychloroquine (PLAQUENIL) 200 MG tablet Take 200 mg by mouth daily.     . insulin aspart (NOVOLOG FLEXPEN) 100 UNIT/ML FlexPen INJECT 10 TO 14 UNITS INTO THE SKIN 3 (THREE) TIMES DAILY WITH MEALS. 45 mL 3  . Insulin Glargine (TOUJEO SOLOSTAR) 300 UNIT/ML SOPN Inject 20 Units into the skin at bedtime. 9 pen 3  . lamoTRIgine (LAMICTAL) 100 MG tablet Take 1 tablet (100 mg total) by mouth 2 (two) times daily. 180 tablet 3  . levothyroxine (SYNTHROID, LEVOTHROID) 50 MCG tablet TAKE 1 TABLET BY MOUTH ONCE DAILY AND 1/2 TABLET ON SUNDAYS. 30 tablet 0  . lidocaine (XYLOCAINE) 2 % jelly APPLY TO LEFT ABDOMINAL WALL TWICE DAILY. (Patient not taking: Reported on 08/23/2017) 30 mL 0  . LORazepam (ATIVAN) 0.5 MG tablet Take 1 tablet (0.5 mg total) by mouth 3 (three) times daily. 270 tablet 2  . losartan (COZAAR) 50 MG tablet Take 1 tablet (50 mg total) by mouth daily. 90 tablet 1  . meclizine (ANTIVERT) 25 MG tablet Take 25 mg by mouth 3 (three) times daily as needed for  dizziness. 07/10/17 Has medication and reports not taking often,  but says she takes if dizzy.    . metoprolol tartrate (LOPRESSOR) 50 MG tablet TAKE 1 TABLET BY MOUTH TWICE DAILY. 60 tablet 9  . montelukast (SINGULAIR) 10 MG tablet TAKE ONE TABLET BY MOUTH ONCE DAILY. 90 tablet 0  . Multiple Vitamins-Minerals (HM MULTIVITAMIN ADULT GUMMY PO) Take 1 tablet by mouth daily.    [name] NYAMYC powder APPLY TO AFFECTED AREA FOUR TIMES DAILY. (Patient not taking: Reported on 09/26/2017) 30 g 0  . nystatin (NYAMYC) powder APPLY TO AFFECTED AREA 4 TIMES DAILY. 30 g 2  . oxyCODONE-acetaminophen (PERCOCET/ROXICET) 5-325 MG tablet Take by mouth as needed.    . polyethylene glycol (MIRALAX) packet Take 17 g by mouth daily. 14 each 0  . pregabalin (LYRICA) 75 MG capsule Take 1 capsule (75 mg total) by mouth daily.    . RABEprazole (ACIPHEX) 20 MG tablet Take 1 tablet (20 mg total) by mouth 2 (two) times daily. 60 tablet 3  . RESTASIS 0.05 % ophthalmic emulsion Place 1 drop into both eyes 2 (two) times daily.     . risperiDONE (RISPERDAL) 0.5 MG tablet Take 1  tablet (0.5 mg total) by mouth at bedtime. 90 tablet 2  . rosuvastatin (CRESTOR) 5 MG tablet TAKE 1 TABLET BY MOUTH AT BEDTIME. 90 tablet 0  . sertraline (ZOLOFT) 100 MG tablet Take 1 tablet (100 mg total) by mouth daily. 90 tablet 2  . traZODone (DESYREL) 100 MG tablet Take 1 tablet (100 mg total) by mouth at bedtime. 90 tablet 2  . triamcinolone ointment (KENALOG) 0.1 % Apply 1 application topically at bedtime. Apply to elbows at bedtime.    [name] UNABLE TO FIND Lift chair x 1  DX 1 each 0   No current facility-administered medications on file prior to visit.       Allergies  Allergen Reactions  . Cephalexin Hives  . Iron Nausea And Vomiting  . Milk-Related Compounds Other (See Comments)    Doesn't agree with stomach.   . Penicillins Hives       . Phenazopyridine Hcl Hives         Family History  Problem Relation Age of Onset  . Heart attack  Mother        HTN  . Pneumonia Father   . Kidney failure Father   . Diabetes Father   . Pancreatic cancer Sister   . Diabetes Brother   . Hypertension Brother   . Diabetes Brother   . Cancer Sister        breast   . Hypertension Son   . Sleep apnea Son   . Cancer Sister        pancreatic  . Stroke Maternal Grandmother   . Heart attack Maternal Grandfather   . Alcohol abuse Maternal Uncle   . Colon cancer Neg Hx   . Anesthesia problems Neg Hx   . Hypotension Neg Hx   . Malignant hyperthermia Neg Hx   . Pseudochol deficiency Neg Hx    PE: BP 118/60   Pulse 70   Ht 4\' 11"  (1.499 m)   Wt 156 lb 3.2 oz (70.9 kg)   SpO2 98%   BMI 31.55 kg/m  Body mass index is 31.55 kg/m.  Wt Readings from Last 3 Encounters:  10/08/17 156 lb 3.2 oz (70.9 kg)  10/08/17 145 lb (65.8 kg)  08/23/17 153 lb (69.4 kg)   Constitutional: Normal weight, in NAD Eyes: PERRLA, EOMI, no exophthalmos ENT: moist mucous membranes, no thyromegaly, but enlarged thyroid nodule felt in isthmus, no cervical lymphadenopathy Cardiovascular: RRR, No MRG Respiratory: CTA B Gastrointestinal: abdomen soft, NT, ND, BS+ Musculoskeletal: no deformities, strength intact in all 4 Skin: moist, warm, no rashes Neurological: no tremor with outstretched hands, DTR normal in all 4   ASSESSMENT: 1. DM2, insulin-dependent, uncontrolled, without complications - gastroparesis - cerebrovasc. Ds -  H/o stroke - PN  She was interested in an insulin pump >> discussed about [employer] (given brochure) at a previous visit.  2. Hypothyroidism  3. MNG - Thyroid U/S (10/11/2012):  Right thyroid lobe: 3.7 x 1.0 x 1.8 cm  Left thyroid lobe: 3.5 x 1.5 x 1.6 cm  Isthmus: 0.5 cm  Focal nodules: There is a 0.3 mm calcified nodule in the right midzone. There is a 0.6 x 0.7 x 0.5 cm hypoechoic nodule in the lateral aspect of the right midzone. There is a 0.7 x 0.6 x 0.5 cm hyperechoic nodule in the lateral aspect of the right  lower pole. There is a 2.2 x 1.1 x 1.7 cm complex solid nodule in the inferior aspect of the isthmus extending adjacent to the  left lower pole. There is a 1.4 x 2.1 x 1.4 cm complex solid nodule in the left lower pole.  Lymphadenopathy: None visualized.  IMPRESSION: Multi nodular goiter which has markedly progressed since the prior exam. The dominant nodule at the inferior aspect of the isthmus fits criteria for fine needle aspiration biopsy if not previously Assessed.  - FNA (11/05/2012) x2: benign  - Thyroid U/S (01/20/2014) - felt one nodule enlarging >> new U/S: stable appearance of the nodules, except a small new nodule in isthmus: 1.2 cm   Right thyroid lobe Measurements: 4.4 cm x 1.2 cm x 1.7 cm. Multiple right-sided nodules identified. Each right-sided nodule demonstrates increased echogenicity, with the superior measuring 7 mm -8 mm, most inferior measuring 9 mm - 10 mm. A small, 3 mm focus of calcium with posterior shadowing is evident. There is also a mid nodule measuring 7 mm, which is echogenic.  Left thyroid lobe Measurements: 5.1 cm x 2.1 cm x 2.3 cm. Dominant lesion at the inferior pole of left thyroid is again evident, which has been previously biopsied (10/29/2012). This nodule measures 1.8 cm x 1.7cm x 2.5 cm. (Previous 1.4 cm x 2.1 cm x 1.4 cm)  Isthmus Thickness: 4 mm-5 mm. Isthmic nodule again noted, previously biopsied (10/29/2012). Currently this nodule measures 2.2 cm x 1.4 cm x 1.8 cm. (previous measurement 2.2 cm x 1.1 cm x 1.7 cm). There is a new nodule identified within the isthmus with heterogeneously hyperechoic characteristics. This nodule measures 12 mm x 5.4 mm x 7.2 mm.  Lymphadenopathy: None visualized.  - Thyroid U/S (11/09/2015): Details   Reading Physician Reading Date Result Priority  Sandi Mariscal, MD 11/09/2015   Narrative    CLINICAL DATA: Follow-up thyroid nodules.  EXAM: THYROID ULTRASOUND  TECHNIQUE: Ultrasound examination of the  thyroid gland and adjacent soft tissues was performed.  COMPARISON: Thyroid ultrasound - 01/20/2014 ; 10/11/2012  FINDINGS: Parenchymal Echotexture: Moderately heterogenous  Estimated total number of nodules > 1 cm: <5  Number of spongiform nodules > 2 cm not described below (TR1): 0  Number of mixed cystic nodules > 1.5 cm not described below ([place]): 0  _________________________________________________________  Isthmus: 0.7 cm  Nodule # 1:  Prior biopsy: No  Location: Isthmus; left  Size: 1.5 x 1.3 x 1.5 cm, previously 2.2 x 1.4 x 1.8 cm  Composition: solid/almost completely solid (2)  Echogenicity: hyperechoic (1)  Shape: not taller-than-wide (0)  Margins: ill-defined (0)  Echogenic foci: none (0)  ACR TI-RADS total points: 3.  ACR TI-RADS risk category: TR3 (3 points).  Change in features: Yes. Increased internal cystic degeneration and smaller in size.  Change in ACR TI-RADS risk category: No  ACR TI-RADS recommendations:  *Given size (1.5 - 2.4 cm) and appearance, a follow-up ultrasound in 1 year is recommended based on TI-RADS criteria as clinically indicated.  _________________________________________________________  Right lobe: 4.6 x 1.1 x 1.9 cm, previously 4.4 x 1.2 x 1.7 cm  Stable scattered subcentimeter echogenic nodules  _________________________________________________________  Left lobe: 4.8 x 2.1 x 1.9 cm, previously 5.1 x 2.1 x 2.3 cm  Nodule # 1:  Prior biopsy: No  Location: Left; Inferior  Size: 2.4 x 1.7 x 1.9 cm, previously 2.5 x 1.7 x 1.7  Composition: solid/almost completely solid (2)  Echogenicity: hyperechoic (1)  Shape: not taller-than-wide (0)  Margins: ill-defined (0)  Echogenic foci: none (0)  ACR TI-RADS total points: 3.  ACR TI-RADS risk category: TR3 (3 points).  Change in features: No  Change in ACR TI-RADS risk category: No  ACR TI-RADS recommendations:  *Given size (1.5 - 2.4 cm) and  appearance, a follow-up ultrasound in 1 year is recommended based on TI-RADS criteria as clinically indicated.  IMPRESSION: TR 3 isthmic and left thyroid nodules unchanged. *Given size (1.5 - 2.4 cm) and appearance of both thyroid nodules, a follow-up ultrasound in 1 year is recommended based on TI-RADS criteria as clinically indicated.  The above is in keeping with the ACR TI-RADS recommendations - J Am Coll Radiol 2017;14:587-595.   Electronically Signed By: Sandi Mariscal M.D. On: 11/09/2015 19:10       None of the nodules have enlarged or show concerning features. Since they have been biopsied before, will continue following them clinically and by ultrasound.  PLAN:  1. Patient with long-standing, previously uncontrolled, type 2 diabetes, on basal-bolus insulin regimen, with improving control in the last year.  At last visit, her HbA1c was excellent, at 5.8% and we did not change her insulin regimen. - At this visit, sugars are higher especially in the last week due to her UTI and also her steroid injection.   - We will increase the doses of her insulin by 2 units, but I advised her to decrease the doses back when her UTI resolves and her sugars start improving. - We also discussed about the Freestyle libre CGM, which I recommended and given a list of suppliers - I advised her to:  Patient Instructions  Please increase: - Toujeo 22 units at bedtime - Novolog:  10 units before a smaller meal  12 units before a regular meal  14 units before a larger meal or with lunch  When your urinary tract infection resolves, you may need to decrease the doses by 2 units.  Please return in 4 months with your sugar log.   - today, HbA1c is 5.8% (stable, great!) - continue checking sugars at different times of the day - check 3x a day, rotating checks - advised for yearly eye exams >> she is UTD - Return to clinic in 4 mo with sugar log   2. Hypothyroidism - latest thyroid labs  reviewed with pt >> normal 10/2016 - she continues on LT4 50 mcg 6 out of 7 days and 25 mcg 1 out of 7 days - pt feels good on this dose. - we discussed about taking the thyroid hormone every day, with water, >30 minutes before breakfast, separated by >4 hours from acid reflux medications, calcium, iron, multivitamins. Pt. is taking it correctly.  At last visit, we moved her AcipHex more than 4 hours after levothyroxine - will check thyroid tests today: TSH and fT4 - If labs are abnormal, she will need to return for repeat TFTs in 1.5 months  3. MNG  She previously had 2 normal biopsies in 2014  Reviewed the report of her imaging tests: Ultrasound from 2014, 2015, 2017: Stable nodules  She has a history of mild dysphagia, but no external esophageal compression per barium swallow study from 08/2014.  At that time, she did have some esophageal dysmotility and she had multiple esophageal dilations.  No other neck compression symptoms  We will continue to watch for goiter expectantly for now  Office Visit on 10/08/2017  Component Date Value Ref Range Status  . TSH 10/08/2017 0.52  0.35 - 4.50 uIU/mL Final  . Free T4 10/08/2017 0.98  0.60 - 1.60 ng/dL Final   Comment: Specimens from patients who are undergoing biotin therapy and /or ingesting  biotin supplements may contain high levels of biotin.  The higher biotin concentration in these specimens interferes with this Free T4 assay.  Specimens that contain high levels  of biotin may cause false high results for this Free T4 assay.  Please interpret results in light of the total clinical presentation of the patient.    Normal TFTs.  Philemon Kingdom, MD PhD [facility]

## 2017-10-08 NOTE — Telephone Encounter (Signed)
Patient states that her meloxicam was supposed to be increased at her last office visit. She states [facility] does not have it, she is requesting a 90day supply [phone]

## 2017-10-08 NOTE — Progress Notes (Signed)
   [name]     MRN: [id number]      DOB: 08/01/1950   HPI [name] is here for evaluation for seat  lift mechanismevaluation .  She does have advanced degenerative lumbar spondylosis and disc and facet disease , as well as foraminal stenosis at L 3/L4, L 4 /L5 and L 5 /S1  She also has congenital central cervical  spinal stenosis  With disc disease at C 5/6 , C 6/7 and C 7/T1  Along with a longstanding history of insulin dependent diabetes has resulted in severe neuromuscular disease with complaints of ataxia, gait and movement disorder , and recurrent falls.  Patient is completely incapable of standing up from a regular armchair or any chair in her home without additional support.  Once standing , she is able to safely ambulate with the use of an assistive device.  [name] is being co managed by several Specialists as far as her mobility and neurologic conditions are concerned , these include orthopedics, chronic pain management, neurosurgery and neurology.    1 week h/o frequency and dysuria, no fever has chills all the time denies fever , has been drinking a lot or water   ROS Denies recent fever or chills. Denies sinus pressure, nasal congestion, ear pain or sore throat. Denies chest congestion, productive cough or wheezing. Denies chest pains, palpitations and leg swelling Denies  nausea, vomiting,diarrhea or constipation.  C/o flank pain  . Denies uncontrolled depression, anxiety or insomnia. Denies skin break down or rash.   PE  BP (!) 104/46 (BP Location: Right Arm, Patient Position: Sitting, Cuff Size: Normal)   Pulse 70   Resp 12   Ht 4\' 11"  (1.499 m)   Wt 145 lb (65.8 kg)   SpO2 96%   BMI 29.29 kg/m   Patient alert and oriented and in no cardiopulmonary distress.  HEENT: No facial asymmetry, EOMI,   oropharynx pink and moist.  Neck supple no JVD, no mass.  Chest: Clear to auscultation bilaterally.Decreased air entry  CVS: S1, S2 no murmurs, no  S3.Regular rate.  ABD: Soft non tender. No  suprapubic angle tenderness. No  Renal angle tenderness  Ext: No edema  YT:[id number] ROM spine, shoulders, hips and knees.  Skin: Intact, no ulcerations or rash noted.  Psych: Good eye contact, normal affect. Memory intact not anxious or depressed appearing.  CNS: CN 2-12 intact, power,  normal throughout.no focal deficits noted.   Assessment & Plan  Light cigarette smoker (1-9 cigarettes per day) Asked: confirms currently;y smokes 5 to 7 cigarettes daily  Assess; not willing to quit now states she has pain  Advise: need to quit  To reduce cancer heart disease and stroke risk Assist counseled for 3 minutes, given written information on quitting and the 1800 QUIT NOW# Arrange : f/u in 3  months  Cystitis Abnormal UA send for culture then treat when report is available  Lumbar spondylosis with myelopathy Patient in for medical evaluation for need for seat lift mechanism in her home. 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

## 2017-10-08 NOTE — Assessment & Plan Note (Signed)
Asked: confirms currently;y smokes 5 to 7 cigarettes daily  Assess; not willing to quit now states she has pain  Advise: need to quit  To reduce cancer heart disease and stroke risk Assist counseled for 3 minutes, given written information on quitting and the 1800 QUIT NOW# Arrange : f/u in 3  months

## 2017-10-08 NOTE — Patient Instructions (Addendum)
Please increase: - Toujeo 22 units at bedtime - Novolog:  10 units before a smaller meal  12 units before a regular meal  14 units before a larger meal or with lunch  When your urinary tract infection resolves, you may need to decrease the doses by 2 units.  Please return in 4 months with your sugar log.

## 2017-10-08 NOTE — Assessment & Plan Note (Signed)
Abnormal UA send for culture then treat when report is available

## 2017-10-09 ENCOUNTER — Encounter: Payer: Self-pay | Admitting: Family Medicine

## 2017-10-09 DIAGNOSIS — R32 Unspecified urinary incontinence: Secondary | ICD-10-CM | POA: Diagnosis not present

## 2017-10-09 DIAGNOSIS — M150 Primary generalized (osteo)arthritis: Secondary | ICD-10-CM | POA: Diagnosis not present

## 2017-10-09 DIAGNOSIS — I639 Cerebral infarction, unspecified: Secondary | ICD-10-CM | POA: Diagnosis not present

## 2017-10-09 DIAGNOSIS — E119 Type 2 diabetes mellitus without complications: Secondary | ICD-10-CM | POA: Diagnosis not present

## 2017-10-09 LAB — POCT GLYCOSYLATED HEMOGLOBIN (HGB A1C): HEMOGLOBIN A1C: 5.8 % — AB (ref 4.0–5.6)

## 2017-10-09 LAB — URINE CULTURE
MICRO NUMBER:: 90894799
SPECIMEN QUALITY:: ADEQUATE

## 2017-10-09 NOTE — Progress Notes (Signed)
Jimi Giza Treto     MRN: [id number]      DOB: 03/19/1950   HPI Ms. Cavalieri is here for follow up and re-evaluation of chronic medical conditions, medication management and review of any available recent lab and radiology data.  Preventive health is updated, specifically  Cancer screening and Immunization.   Questions or concerns regarding consultations or procedures which the PT has had in the interim are  addressed. The PT denies any adverse reactions to current medications since the last visit.  There are no new concerns.  There are no specific complaints   ROS Denies recent fever or chills. Denies sinus pressure, nasal congestion, ear pain or sore throat. Denies chest congestion, productive cough or wheezing. Denies chest pains, palpitations and leg swelling Denies abdominal pain, nausea, vomiting,diarrhea or constipation.   Denies dysuria, frequency, hesitancy c/o  incontinence. C/o chronic  joint pain, swelling and limitation in mobility. Denies headaches, seizures, numbness, or tingling. Denies uncontrolled  depression, anxiety or insomnia. Denies skin break down or rash.   PE  BP (!) 114/54 (BP Location: Left Arm, Patient Position: Sitting, Cuff Size: Normal)   Pulse 60   Ht 5\' 1"  (1.549 m)   Wt 152 lb 0.6 oz (69 kg)   SpO2 98%   BMI 28.73 kg/m   Patient alert and oriented and in no cardiopulmonary distress.  HEENT: No facial asymmetry, EOMI,   oropharynx pink and moist.  Neck supple no JVD, no mass.  Chest: Clear to auscultation bilaterally.  CVS: S1, S2 no murmurs, no S3.Regular rate.  ABD: Soft non tender.   Ext: No edema  MS: decreased  ROM spine, shoulders, hips and knees.  Skin: Intact, no ulcerations or rash noted.  Psych: Good eye contact, normal affect. Memory intact not anxious or depressed appearing.  CNS: CN 2-12 intact, power,  normal throughout.no focal deficits noted.   Assessment & Plan  Labile hypertension Adequate control no med  change Slightly hypotensive at visit but asymptomatic  Type 2 diabetes mellitus with hyperglycemia, with long-term current use of insulin (HCC) Controlled, no change in medication Managed by endo Ms. Lynes is reminded of the importance of commitment to daily physical activity for 30 minutes or more, as able and the need to limit carbohydrate intake to 30 to 60 grams per meal to help with blood sugar control.   The need to take medication as prescribed, test blood sugar as directed, and to call between visits if there is a concern that blood sugar is uncontrolled is also discussed.   Ms. Dahmer is reminded of the importance of daily foot exam, annual eye examination, and good blood sugar, blood pressure and cholesterol control.  Diabetic Labs Latest Ref Rng & Units 10/09/2017 06/27/2017 06/26/2017 06/07/2017 05/19/2017  HbA1c 4.0 - 5.6 % 5.8(A) - - 5.8 -  Microalbumin <2.0 mg/dL - - - - -  Micro/Creat Ratio 0.0 - 30.0 mg/g - - - - -  Chol 100 - 199 mg/dL - - - - -  HDL >39 mg/dL - - - - -  Calc LDL 0 - 99 mg/dL - - - - -  Triglycerides 0 - 149 mg/dL - - - - -  Creatinine 0.44 - 1.00 mg/dL - 1.09(H) 1.04(H) - 1.11(H)  GFR >60.00 mL/min - - - - -   BP/Weight 10/08/2017 10/08/2017 08/23/2017 08/16/2017 08/10/2017 08/08/2017 7/82/9562  Systolic BP 130 865 784 696 295 284 132  Diastolic BP 46 60 68 78 48 54 80  Wt. (Lbs) 145 156.2 153 - 153.25 152.04 154  BMI 29.29 31.55 30.9 - 28.96 28.73 29.1  Some encounter information is confidential and restricted. Go to Review Flowsheets activity to see all data.   Foot/eye exam completion dates Latest Ref Rng & Units 03/28/2017 09/25/2016  Eye Exam No Retinopathy Retinopathy(A) -  Foot exam Order - - -  Foot Form Completion - - Done          Hyperlipidemia Hyperlipidemia:Low fat diet discussed and encouraged.   Lipid Panel  Lab Results  Component Value Date   CHOL 175 02/05/2017   HDL 67 02/05/2017   LDLCALC 87 02/05/2017   TRIG 105 02/05/2017    CHOLHDL 3.1 07/19/2015   Controlled, no change in medication    Light cigarette smoker (1-9 cigarettes per day) Asked : confirms currently smokes about 5 to 8 cigarettes daily Assess: wants to quit but unwilling / unable to set a quit date Assist: counseled for 5 mins and literature provided as well as # to call to assist with help in quitting Addvise : benfit of reducing risk of heart disease and stroke and cancer also financial Arrange: f/U in 4 months

## 2017-10-09 NOTE — Addendum Note (Signed)
Addended by: Drucilla Schmidt on: 10/09/2017 08:05 AM   Modules accepted: Orders

## 2017-10-10 ENCOUNTER — Ambulatory Visit (INDEPENDENT_AMBULATORY_CARE_PROVIDER_SITE_OTHER): Payer: Medicare HMO | Admitting: Psychiatry

## 2017-10-10 ENCOUNTER — Other Ambulatory Visit: Payer: Self-pay

## 2017-10-10 ENCOUNTER — Telehealth: Payer: Self-pay | Admitting: Family Medicine

## 2017-10-10 DIAGNOSIS — R200 Anesthesia of skin: Secondary | ICD-10-CM | POA: Insufficient documentation

## 2017-10-10 DIAGNOSIS — M5030 Other cervical disc degeneration, unspecified cervical region: Secondary | ICD-10-CM | POA: Diagnosis not present

## 2017-10-10 DIAGNOSIS — F331 Major depressive disorder, recurrent, moderate: Secondary | ICD-10-CM

## 2017-10-10 DIAGNOSIS — M25512 Pain in left shoulder: Secondary | ICD-10-CM | POA: Diagnosis not present

## 2017-10-10 MED ORDER — MONTELUKAST SODIUM 10 MG PO TABS: 10.0000 mg | ORAL_TABLET | Freq: Every day | ORAL | 1 refills | Status: DC

## 2017-10-10 MED ORDER — LEVOTHYROXINE SODIUM 50 MCG PO TABS: ORAL_TABLET | ORAL | 5 refills | Status: DC

## 2017-10-10 MED ORDER — ROSUVASTATIN CALCIUM 5 MG PO TABS: 5.0000 mg | ORAL_TABLET | Freq: Every day | ORAL | 1 refills | Status: DC

## 2017-10-10 MED ORDER — NYSTATIN 100000 UNIT/GM EX POWD: CUTANEOUS | 2 refills | Status: DC

## 2017-10-10 NOTE — Progress Notes (Signed)
Patient:  [name]                DOB: 1951-02-03          MR Number:  [id number]  Location:   [place]:  [place]                       [place],  [place],    [place]          Start:  Wednesday 10/10/2017 2:15 PM   End:       Wednesday 10/10/2017 3:00 PM     Provider/Observer:     Maurice Small, MSW, LCSW   Chief Complaint:                                                    Depression  Reason For Service:               [name] is a 67 y.o. female who presents with a long standing history of recurrent periods of depression beginning when she was 35 and her favorite uncle died. She h         as been experiencing increased symptoms of depression recently due to stress in the relationship with her son. Per patient's report, he rarely visits her anymore due to his involvement with an older woman who is controlling. She also reports frustration regarding her knees who is her power of attorn   ey and is very demanding perpatient's report. She reports issues with other family members and states she needs help dealing with her family Patient reports multiple psychiatric hospitlaizations due to depression and suicidal ideations with thel last one occuring in 1997. Patient has participated in outpatient psychotherapy and medication management intermittently since age 22.  She currently is seeing psychiatrist Dr. Harrington Challenger . Prior to this, she was being seen at [facility]. Patient also has had ECT at [facility].    Interventions Strategy:  Supportive  Participation Level:   Active      Participation Quality:  Appropriate      Behavioral Observation:  Casual, well groomed, speech very slow, drowsy   Current Psychosocial Factors: Tension in relationship with son, multiple health issues, concerns about grandson,   Content of Session:   reviewed symptoms, discussed stressors, facilitated expression of thoughts and feelings, validated feelings, assisted patient identify ways to  improve interpersonal skills regarding communication with son's girlfriend, discussed and practiced emotional regulation strategies to manage anger Current Status:   Fluctuations in mood, worry,     Suicidal/Homicidal:    No   Patient Progress:   Patient last was seen about 2-3 weeks ago.  She is very lethargic as she reports having a cortisone shot in her shoulder earlier this morning and reports this sometimes makes her drowsy. She continues to reports stress regarding her health. She expresses happiness her son and grandson celebrated her grandson's birthday together. Patient expresses some relief but they seem to be working on their relationship. She reports ambivalent feelings about upcoming visit from her son, his girlfriend, grandson, and granddaughter this weekend. She wants to see her son but expresses frustration about the girlfriend visiting as she interrupts their conversations. Patient expresses anger girlfriend makes unfavorable remarks about patient's grandson per her report. Patient expresses concern about how she  may react during the visit.    Target Goals:   1.  Learn and implement behavioral strategies to overcome depression.    2.  Verbalize an understanding and resolution of current interpersonal problems.   Last Reviewed:   10/13/2016  Goals Addressed Today:    1,2  Plan:      Return again in 2 - 3 weeks.  Impression/Diagnosis:   Patient presents with long standing history of recurrent periods  of depression beginning when she was [age] and her favorite uncle died. Patient reports multiple psychiatric hospitlaizations due to depression and suicidal ideations with thel last one occuring in 1997. Patient has participated in outpatient psychotherapy and medication management intermittently since age 51.  She currently is seeing psychiatrist Dr. Harrington Challenger . Prior to this, she was being seen at [facility]. Patient also has had ECT at [facility]. Symptoms have worsened in recent months due  to family stress and have  included depressed mood, anxiety, excessive worry, and tearfulness.   Diagnosis:                                Axis I: MDD recurrent episode, moderate          Axis II: Deferred     Keldon Lassen, LCSW 10/10/2017

## 2017-10-10 NOTE — Telephone Encounter (Signed)
Called pt yesterday and left the message to call back and returned her call today and left message that the test she was calling about was negative and I have refilled 3 of her meds

## 2017-10-10 NOTE — Telephone Encounter (Signed)
Patient wants you to call her about her med and her lab work.  Please call today.

## 2017-10-11 ENCOUNTER — Other Ambulatory Visit: Payer: Self-pay

## 2017-10-11 ENCOUNTER — Telehealth: Payer: Self-pay | Admitting: Family Medicine

## 2017-10-11 DIAGNOSIS — I10 Essential (primary) hypertension: Secondary | ICD-10-CM | POA: Diagnosis not present

## 2017-10-11 DIAGNOSIS — J449 Chronic obstructive pulmonary disease, unspecified: Secondary | ICD-10-CM | POA: Diagnosis not present

## 2017-10-11 MED ORDER — ALBUTEROL SULFATE HFA 108 (90 BASE) MCG/ACT IN AERS: INHALATION_SPRAY | RESPIRATORY_TRACT | 3 refills | Status: DC

## 2017-10-11 NOTE — Telephone Encounter (Signed)
Sent to [facility]  

## 2017-10-11 NOTE — Telephone Encounter (Signed)
Patient called in to ask if you have sent her inhaler rx into [facility] ? If not, she is requesting you to send it to [facility]

## 2017-10-12 ENCOUNTER — Other Ambulatory Visit: Payer: Self-pay

## 2017-10-12 ENCOUNTER — Encounter: Payer: Self-pay | Admitting: Family Medicine

## 2017-10-12 DIAGNOSIS — R296 Repeated falls: Secondary | ICD-10-CM

## 2017-10-12 DIAGNOSIS — M4716 Other spondylosis with myelopathy, lumbar region: Secondary | ICD-10-CM | POA: Insufficient documentation

## 2017-10-12 MED ORDER — UNABLE TO FIND: 0 refills | Status: DC

## 2017-10-12 NOTE — Assessment & Plan Note (Signed)
Controlled, no change in medication Managed by endo [name] is reminded of the importance of commitment to daily physical activity for 30 minutes or more, as able and the need to limit carbohydrate intake to 30 to 60 grams per meal to help with blood sugar control.   The need to take medication as prescribed, test blood sugar as directed, and to call between visits if there is a concern that blood sugar is uncontrolled is also discussed.   [name] is reminded of the importance of daily foot exam, annual eye examination, and good blood sugar, blood pressure and cholesterol control.  Diabetic Labs Latest Ref Rng & Units 10/09/2017 06/27/2017 06/26/2017 06/07/2017 05/19/2017  HbA1c 4.0 - 5.6 % 5.8(A) - - 5.8 -  Microalbumin <2.0 mg/dL - - - - -  Micro/Creat Ratio 0.0 - 30.0 mg/g - - - - -  Chol 100 - 199 mg/dL - - - - -  HDL >39 mg/dL - - - - -  Calc LDL 0 - 99 mg/dL - - - - -  Triglycerides 0 - 149 mg/dL - - - - -  Creatinine 0.44 - 1.00 mg/dL - 1.09(H) 1.04(H) - 1.11(H)  GFR >60.00 mL/min - - - - -   BP/Weight 10/08/2017 10/08/2017 08/23/2017 08/16/2017 08/10/2017 08/08/2017 8/82/8003  Systolic BP 491 791 505 697 948 016 553  Diastolic BP 46 60 68 78 48 54 80  Wt. (Lbs) 145 156.2 153 - 153.25 152.04 154  BMI 29.29 31.55 30.9 - 28.96 28.73 29.1  Some encounter information is confidential and restricted. Go to Review Flowsheets activity to see all data.   Foot/eye exam completion dates Latest Ref Rng & Units 03/28/2017 09/25/2016  Eye Exam No Retinopathy Retinopathy(A) -  Foot exam Order - - -  Foot Form Completion - - Done

## 2017-10-12 NOTE — Assessment & Plan Note (Signed)
Asked : confirms currently smokes about 5 to 8 cigarettes daily Assess: wants to quit but unwilling / unable to set a quit date Assist: counseled for 5 mins and literature provided as well as # to call to assist with help in quitting Addvise : benfit of reducing risk of heart disease and stroke and cancer also financial Arrange: f/U in 4 months

## 2017-10-12 NOTE — Assessment & Plan Note (Signed)
Managed by psychiatry and stable 

## 2017-10-12 NOTE — Assessment & Plan Note (Signed)
Requests referral back to neurosrgeon be approved , already has an appt

## 2017-10-12 NOTE — Assessment & Plan Note (Addendum)
Patient in for medical evaluation for need for seat lift mechanism in her home. Documentation provided to support her need

## 2017-10-12 NOTE — Assessment & Plan Note (Signed)
Dose of antihypertensive medication reduced with close follow up

## 2017-10-12 NOTE — Assessment & Plan Note (Signed)
Hyperlipidemia:Low fat diet discussed and encouraged.   Lipid Panel  Lab Results  Component Value Date   CHOL 175 02/05/2017   HDL 67 02/05/2017   LDLCALC 87 02/05/2017   TRIG 105 02/05/2017   CHOLHDL 3.1 07/19/2015   Controlled, no change in medication

## 2017-10-12 NOTE — Assessment & Plan Note (Signed)
Reports cystitis symptoms, CCUA is abnormal, will wait on antibiotic until c/s is available

## 2017-10-12 NOTE — Assessment & Plan Note (Signed)
Adequate control no med change Slightly hypotensive at visit but asymptomatic

## 2017-10-15 ENCOUNTER — Inpatient Hospital Stay (HOSPITAL_COMMUNITY): Payer: Medicare HMO | Attending: Hematology

## 2017-10-15 ENCOUNTER — Other Ambulatory Visit: Payer: Self-pay | Admitting: Family Medicine

## 2017-10-15 ENCOUNTER — Other Ambulatory Visit (HOSPITAL_COMMUNITY): Payer: Self-pay

## 2017-10-15 ENCOUNTER — Telehealth: Payer: Self-pay | Admitting: Family Medicine

## 2017-10-15 ENCOUNTER — Other Ambulatory Visit: Payer: Self-pay

## 2017-10-15 ENCOUNTER — Telehealth: Payer: Self-pay

## 2017-10-15 DIAGNOSIS — Z8673 Personal history of transient ischemic attack (TIA), and cerebral infarction without residual deficits: Secondary | ICD-10-CM | POA: Insufficient documentation

## 2017-10-15 DIAGNOSIS — R296 Repeated falls: Secondary | ICD-10-CM

## 2017-10-15 DIAGNOSIS — D509 Iron deficiency anemia, unspecified: Secondary | ICD-10-CM | POA: Diagnosis not present

## 2017-10-15 DIAGNOSIS — R040 Epistaxis: Secondary | ICD-10-CM

## 2017-10-15 DIAGNOSIS — D500 Iron deficiency anemia secondary to blood loss (chronic): Secondary | ICD-10-CM

## 2017-10-15 LAB — CBC WITH DIFFERENTIAL/PLATELET
Basophils Absolute: 0 10*3/uL (ref 0.0–0.1)
Basophils Relative: 0 %
EOS ABS: 0.2 10*3/uL (ref 0.0–0.7)
EOS PCT: 3 %
HCT: 36.4 % (ref 36.0–46.0)
Hemoglobin: 10.7 g/dL — ABNORMAL LOW (ref 12.0–15.0)
LYMPHS ABS: 2.3 10*3/uL (ref 0.7–4.0)
LYMPHS PCT: 33 %
MCH: 22.8 pg — AB (ref 26.0–34.0)
MCHC: 29.4 g/dL — AB (ref 30.0–36.0)
MCV: 77.4 fL — ABNORMAL LOW (ref 78.0–100.0)
MONO ABS: 0.6 10*3/uL (ref 0.1–1.0)
MONOS PCT: 8 %
NEUTROS ABS: 4 10*3/uL (ref 1.7–7.7)
Neutrophils Relative %: 56 %
Platelets: 199 10*3/uL (ref 150–400)
RBC: 4.7 MIL/uL (ref 3.87–5.11)
RDW: 14.3 % (ref 11.5–15.5)
WBC: 7.1 10*3/uL (ref 4.0–10.5)

## 2017-10-15 LAB — COMPREHENSIVE METABOLIC PANEL
ALK PHOS: 95 U/L (ref 38–126)
ALT: 27 U/L (ref 0–44)
ANION GAP: 6 (ref 5–15)
AST: 22 U/L (ref 15–41)
Albumin: 3.7 g/dL (ref 3.5–5.0)
BILIRUBIN TOTAL: 0.5 mg/dL (ref 0.3–1.2)
BUN: 33 mg/dL — ABNORMAL HIGH (ref 8–23)
CALCIUM: 9.1 mg/dL (ref 8.9–10.3)
CO2: 24 mmol/L (ref 22–32)
Chloride: 112 mmol/L — ABNORMAL HIGH (ref 98–111)
Creatinine, Ser: 1.06 mg/dL — ABNORMAL HIGH (ref 0.44–1.00)
GFR, EST NON AFRICAN AMERICAN: 53 mL/min — AB (ref >60)
Glucose, Bld: 105 mg/dL — ABNORMAL HIGH (ref 70–99)
Potassium: 3.9 mmol/L (ref 3.5–5.1)
Sodium: 142 mmol/L (ref 135–145)
TOTAL PROTEIN: 6.9 g/dL (ref 6.5–8.1)

## 2017-10-15 LAB — PROTIME-INR
INR: 1.01
PROTHROMBIN TIME: 13.2 s (ref 11.4–15.2)

## 2017-10-15 LAB — APTT: aPTT: 24 seconds (ref 24–36)

## 2017-10-15 LAB — FERRITIN: FERRITIN: 170 ng/mL (ref 11–307)

## 2017-10-15 LAB — LACTATE DEHYDROGENASE: LDH: 177 U/L (ref 98–192)

## 2017-10-15 MED ORDER — NICOTINE 10 MG IN INHA: 1.0000 | RESPIRATORY_TRACT | 0 refills | Status: DC | PRN

## 2017-10-15 MED ORDER — UNABLE TO FIND: 0 refills | Status: DC

## 2017-10-15 NOTE — Telephone Encounter (Signed)
[employer] doesn't cover the nicotine inhaler.Covers patches, lozenge or chantix and pt wants to do the chantix. Please advise

## 2017-10-15 NOTE — Telephone Encounter (Signed)
Pt is calling, she wants a Nicotine Inhaler called in to stop smoking,

## 2017-10-15 NOTE — Telephone Encounter (Signed)
I have prescribed the medication and the pt is aware

## 2017-10-15 NOTE — Telephone Encounter (Signed)
I will not do chantix because of her depression dx

## 2017-10-15 NOTE — Progress Notes (Signed)
Nicotrol

## 2017-10-16 DIAGNOSIS — M5412 Radiculopathy, cervical region: Secondary | ICD-10-CM | POA: Diagnosis not present

## 2017-10-16 DIAGNOSIS — M4722 Other spondylosis with radiculopathy, cervical region: Secondary | ICD-10-CM | POA: Diagnosis not present

## 2017-10-16 DIAGNOSIS — D329 Benign neoplasm of meninges, unspecified: Secondary | ICD-10-CM | POA: Diagnosis not present

## 2017-10-16 DIAGNOSIS — M5030 Other cervical disc degeneration, unspecified cervical region: Secondary | ICD-10-CM | POA: Diagnosis not present

## 2017-10-16 DIAGNOSIS — M542 Cervicalgia: Secondary | ICD-10-CM | POA: Diagnosis not present

## 2017-10-16 NOTE — Telephone Encounter (Signed)
If pt wants chantix and not the other covered options her psychiatrist must prescribe it for her. Please let her know

## 2017-10-16 NOTE — Telephone Encounter (Signed)
Called pt to advise regarding the medicine, she is aware. Pt states that the Psy dr will not call this in, I advised her that if the Psy Dr said no, for the Psy Dr to reach out to Dr Moshe Cipro.   Pt is wanting --- Please send Referral for a social worker with [facility].

## 2017-10-17 NOTE — Telephone Encounter (Signed)
She already receives services through [facility]. Fax sent to them asking for social work consult

## 2017-10-18 DIAGNOSIS — R260 Ataxic gait: Secondary | ICD-10-CM | POA: Diagnosis not present

## 2017-10-18 DIAGNOSIS — F060 Psychotic disorder with hallucinations due to known physiological condition: Secondary | ICD-10-CM | POA: Diagnosis not present

## 2017-10-18 DIAGNOSIS — M5442 Lumbago with sciatica, left side: Secondary | ICD-10-CM | POA: Diagnosis not present

## 2017-10-18 DIAGNOSIS — M542 Cervicalgia: Secondary | ICD-10-CM | POA: Diagnosis not present

## 2017-10-18 DIAGNOSIS — M5136 Other intervertebral disc degeneration, lumbar region: Secondary | ICD-10-CM | POA: Diagnosis not present

## 2017-10-18 DIAGNOSIS — M4716 Other spondylosis with myelopathy, lumbar region: Secondary | ICD-10-CM | POA: Diagnosis not present

## 2017-10-18 DIAGNOSIS — Z794 Long term (current) use of insulin: Secondary | ICD-10-CM | POA: Diagnosis not present

## 2017-10-18 DIAGNOSIS — G8929 Other chronic pain: Secondary | ICD-10-CM | POA: Diagnosis not present

## 2017-10-18 DIAGNOSIS — E119 Type 2 diabetes mellitus without complications: Secondary | ICD-10-CM | POA: Diagnosis not present

## 2017-10-19 ENCOUNTER — Telehealth: Payer: Self-pay | Admitting: *Deleted

## 2017-10-19 ENCOUNTER — Other Ambulatory Visit: Payer: Self-pay | Admitting: *Deleted

## 2017-10-19 MED ORDER — RABEPRAZOLE SODIUM 20 MG PO TBEC: 20.0000 mg | DELAYED_RELEASE_TABLET | Freq: Two times a day (BID) | ORAL | 3 refills | Status: DC

## 2017-10-19 NOTE — Telephone Encounter (Signed)
I faxed the request for Rabeprazole 20 mg, twice daily script to [facility].  #[phone] with 3 refills, Amy Esterwood PA.  Amy saw this patient 4/26/ 2019.

## 2017-10-22 ENCOUNTER — Ambulatory Visit (HOSPITAL_COMMUNITY): Payer: Self-pay | Admitting: Internal Medicine

## 2017-10-22 ENCOUNTER — Ambulatory Visit (INDEPENDENT_AMBULATORY_CARE_PROVIDER_SITE_OTHER): Payer: Medicare HMO | Admitting: Otolaryngology

## 2017-10-22 DIAGNOSIS — M5136 Other intervertebral disc degeneration, lumbar region: Secondary | ICD-10-CM | POA: Diagnosis not present

## 2017-10-22 DIAGNOSIS — E119 Type 2 diabetes mellitus without complications: Secondary | ICD-10-CM | POA: Diagnosis not present

## 2017-10-22 DIAGNOSIS — Z794 Long term (current) use of insulin: Secondary | ICD-10-CM | POA: Diagnosis not present

## 2017-10-22 DIAGNOSIS — G8929 Other chronic pain: Secondary | ICD-10-CM | POA: Diagnosis not present

## 2017-10-22 DIAGNOSIS — R260 Ataxic gait: Secondary | ICD-10-CM | POA: Diagnosis not present

## 2017-10-22 DIAGNOSIS — M542 Cervicalgia: Secondary | ICD-10-CM | POA: Diagnosis not present

## 2017-10-22 DIAGNOSIS — M4716 Other spondylosis with myelopathy, lumbar region: Secondary | ICD-10-CM | POA: Diagnosis not present

## 2017-10-22 DIAGNOSIS — F060 Psychotic disorder with hallucinations due to known physiological condition: Secondary | ICD-10-CM | POA: Diagnosis not present

## 2017-10-22 DIAGNOSIS — M5442 Lumbago with sciatica, left side: Secondary | ICD-10-CM | POA: Diagnosis not present

## 2017-10-23 ENCOUNTER — Telehealth: Payer: Self-pay | Admitting: *Deleted

## 2017-10-23 ENCOUNTER — Other Ambulatory Visit: Payer: Self-pay

## 2017-10-23 ENCOUNTER — Inpatient Hospital Stay (HOSPITAL_COMMUNITY): Payer: Medicare HMO | Attending: Internal Medicine | Admitting: Internal Medicine

## 2017-10-23 ENCOUNTER — Encounter (HOSPITAL_COMMUNITY): Payer: Self-pay | Admitting: Internal Medicine

## 2017-10-23 DIAGNOSIS — J449 Chronic obstructive pulmonary disease, unspecified: Secondary | ICD-10-CM | POA: Diagnosis not present

## 2017-10-23 DIAGNOSIS — M797 Fibromyalgia: Secondary | ICD-10-CM

## 2017-10-23 DIAGNOSIS — Z79899 Other long term (current) drug therapy: Secondary | ICD-10-CM

## 2017-10-23 DIAGNOSIS — R0602 Shortness of breath: Secondary | ICD-10-CM | POA: Insufficient documentation

## 2017-10-23 DIAGNOSIS — K5730 Diverticulosis of large intestine without perforation or abscess without bleeding: Secondary | ICD-10-CM | POA: Diagnosis not present

## 2017-10-23 DIAGNOSIS — N189 Chronic kidney disease, unspecified: Secondary | ICD-10-CM | POA: Diagnosis not present

## 2017-10-23 DIAGNOSIS — Z7982 Long term (current) use of aspirin: Secondary | ICD-10-CM | POA: Diagnosis not present

## 2017-10-23 DIAGNOSIS — G40909 Epilepsy, unspecified, not intractable, without status epilepticus: Secondary | ICD-10-CM | POA: Insufficient documentation

## 2017-10-23 DIAGNOSIS — M4716 Other spondylosis with myelopathy, lumbar region: Secondary | ICD-10-CM | POA: Diagnosis not present

## 2017-10-23 DIAGNOSIS — Z8673 Personal history of transient ischemic attack (TIA), and cerebral infarction without residual deficits: Secondary | ICD-10-CM | POA: Diagnosis not present

## 2017-10-23 DIAGNOSIS — F17210 Nicotine dependence, cigarettes, uncomplicated: Secondary | ICD-10-CM | POA: Diagnosis not present

## 2017-10-23 DIAGNOSIS — D509 Iron deficiency anemia, unspecified: Secondary | ICD-10-CM | POA: Insufficient documentation

## 2017-10-23 DIAGNOSIS — Z794 Long term (current) use of insulin: Secondary | ICD-10-CM | POA: Diagnosis not present

## 2017-10-23 DIAGNOSIS — Z86010 Personal history of colonic polyps: Secondary | ICD-10-CM | POA: Diagnosis not present

## 2017-10-23 DIAGNOSIS — Z8719 Personal history of other diseases of the digestive system: Secondary | ICD-10-CM | POA: Diagnosis not present

## 2017-10-23 DIAGNOSIS — E785 Hyperlipidemia, unspecified: Secondary | ICD-10-CM | POA: Insufficient documentation

## 2017-10-23 DIAGNOSIS — E1142 Type 2 diabetes mellitus with diabetic polyneuropathy: Secondary | ICD-10-CM | POA: Diagnosis not present

## 2017-10-23 DIAGNOSIS — K219 Gastro-esophageal reflux disease without esophagitis: Secondary | ICD-10-CM | POA: Insufficient documentation

## 2017-10-23 DIAGNOSIS — E039 Hypothyroidism, unspecified: Secondary | ICD-10-CM | POA: Diagnosis not present

## 2017-10-23 DIAGNOSIS — I129 Hypertensive chronic kidney disease with stage 1 through stage 4 chronic kidney disease, or unspecified chronic kidney disease: Secondary | ICD-10-CM | POA: Diagnosis not present

## 2017-10-23 DIAGNOSIS — E1165 Type 2 diabetes mellitus with hyperglycemia: Secondary | ICD-10-CM

## 2017-10-23 DIAGNOSIS — Z720 Tobacco use: Secondary | ICD-10-CM

## 2017-10-23 DIAGNOSIS — K589 Irritable bowel syndrome without diarrhea: Secondary | ICD-10-CM | POA: Insufficient documentation

## 2017-10-23 DIAGNOSIS — R911 Solitary pulmonary nodule: Secondary | ICD-10-CM | POA: Diagnosis not present

## 2017-10-23 DIAGNOSIS — Z800 Family history of malignant neoplasm of digestive organs: Secondary | ICD-10-CM | POA: Insufficient documentation

## 2017-10-23 DIAGNOSIS — F319 Bipolar disorder, unspecified: Secondary | ICD-10-CM | POA: Diagnosis not present

## 2017-10-23 MED ORDER — METOPROLOL TARTRATE 50 MG PO TABS: 50.0000 mg | ORAL_TABLET | Freq: Two times a day (BID) | ORAL | 0 refills | Status: DC

## 2017-10-23 NOTE — Patient Instructions (Signed)
[facility] at [facility] Discharge Instructions  You saw Dr. Higgs today.   Thank you for choosing [facility] at [facility] to provide your oncology and hematology care.  To afford each patient quality time with our provider, please arrive at least 15 minutes before your scheduled appointment time.   If you have a lab appointment with the Cancer Center please come in thru the  Main Entrance and check in at the main information desk  You need to re-schedule your appointment should you arrive 10 or more minutes late.  We strive to give you quality time with our providers, and arriving late affects you and other patients whose appointments are after yours.  Also, if you no show three or more times for appointments you may be dismissed from the clinic at the providers discretion.     Again, thank you for choosing [facility].  Our hope is that these requests will decrease the amount of time that you wait before being seen by our physicians.       _____________________________________________________________  Should you have questions after your visit to [facility], please contact our office at [phone] between the hours of 8:00 a.m. and 4:30 p.m.  Voicemails left after 4:00 p.m. will not be returned until the following business day.  For prescription refill requests, have your pharmacy contact our office and allow 72 hours.    Cancer Center Support Programs:   > Cancer Support Group  2nd Tuesday of the month 1pm-2pm, Journey Room    

## 2017-10-23 NOTE — Progress Notes (Signed)
Diagnosis Shortness of breath - Plan: CT Chest W Contrast, CBC with Differential/Platelet, Comprehensive metabolic panel, Lactate dehydrogenase, Ferritin  Tobacco use - Plan: CT Chest W Contrast, CBC with Differential/Platelet, Comprehensive metabolic panel, Lactate dehydrogenase, Ferritin  Staging Cancer Staging No matching staging information was found for the patient.  Assessment and Plan:  1. Microcytic anemia.  Pt has combination anemia from iron deficiency and chronic kidney disease.  She had Colonoscopy on 02/10/2016 showing 2 sessile polyps in the descending colon, diverticuli in sigmoid colon, EGD on 02/10/2016 showing mild to moderate esophagitis, erythema in the inferior gastric antrum.  Pt is here today for follow-up.  Labs done 10/15/2017 reviewed and shows WBC 7.1 HB 10.7 plts 199,000.  Chemistries WNL with Cr 1 and K+ 3.9.  Ferritin is 170.  She will have repeat labs in 02/2018.  Iron levels improved after IV iron that was last done 07/2017.    Previously reviewed CT of the abdomen and pelvis done 06/28/2017 that showed  IMPRESSION: 1. No acute abnormality seen to explain the patient's symptoms. Small amount of stool noted in the colon, without CT evidence for constipation. 2. Mild diverticulosis along the descending and sigmoid colon, without evidence of diverticulitis.  Follow-up with GI as recommended.    2.  Smoking.  Cessation recommended.  She has > 30 pack yr history of smoking.  She reports some mild SOB.  She is set up for CT chest for further evaluation.    3.  CVA.  Pt has some neurological deficits.  Follow-up with PCP and neurology as directed.    4.  SOB.  Pulse ox is 100% on room air.  Due to smoking history, she is set up for CT chest and will be notified of results.    5.  Fatigue.  I discussed with her HB is 10.7 with iron levels of 170.  Pt should follow-up with neurology if ongoing symptoms.    Greater than 30 minutes spent with more than 50% spent in  counseling and coordination of care.    Interval History: Historical data obtained from note dated 08/10/2017.  66 y.o. female is seen by Dr. Worthy Keeler on 06/26/2017 for evaluation  and management of microcytic anemia.  Labs done 05/19/2017 showed hemoglobin was 8.6 and MCV over 72.  White count and platelet count were within normal limits.  She reports feeling very fatigued and has occasional lightheadedness.  Her last blood transfusion was in 2012.  She was treated with intravenous iron in the past.  She denies any bleeding per rectum but has occasional dark stools.  She has been placed on iron pills for the last few months.  She gets severely constipated when she takes them. Last EGD and colonoscopy were in November 2017.  She lives by herself at home and walks with the help of walker.  She has an aide during the daytime, helping her with cooking and other daily chores.  Labs done 06/26/2017 showed HB 9.9 and ferritin 14.  She was treated with IV iron on 07/05/2017 and 07/12/2017.    Current Status:  Pt is seen for follow-up after IV iron.  She reports some mild SOB.  She is here to go over labs.    Problem List Patient Active Problem List   Diagnosis Date Noted  . Lumbar spondylosis with myelopathy [M47.16] 10/12/2017  . At risk for cardiovascular event [Z91.89] 06/10/2017  . Rash and nonspecific skin eruption [R21] 04/25/2017  . Hypotension [I95.9]   . Diabetic  polyneuropathy associated with type 2 diabetes mellitus ([place]) [E11.42] 05/19/2016  . Tobacco user [Z72.0] 04/24/2016  . History of palpitations [Z87.898] 08/09/2015  . Nausea without vomiting [R11.0] 08/09/2015  . Labile hypertension [R09.89] 08/03/2015  . Normal coronary arteries [Z03.89] 08/03/2015  . Left-sided low back pain with left-sided sciatica [M54.42] 06/27/2015  . Type 2 diabetes mellitus with hyperglycemia, with long-term current use of insulin ([place]) [E11.65, Z79.4] 05/06/2015  . Multinodular goiter [E04.2] 05/06/2015  .  Rectocele, female [N81.6] 04/27/2015  . Anal sphincter incontinence [R15.9] 04/27/2015  . Pelvic relaxation due to rectocele [N81.6] 03/30/2015  . Pulmonary hypertension ([place]) [I27.20] 02/22/2015  . Light cigarette smoker (1-9 cigarettes per day) [F17.210] 01/11/2015  . Migraine without aura and without status migrainosus, not intractable [G43.009] 07/02/2014  . Flatulence [R14.3] 02/18/2014  . Microcytic anemia [D50.9] 02/18/2014  . COPD (chronic obstructive pulmonary disease) with chronic bronchitis ([place]) [J44.9] 09/16/2013  . Hypothyroidism [E03.9] 08/16/2013  . Gastroparesis [K31.84] 04/28/2013  . Seizure disorder ([place]) [Z61.096] 01/19/2013  . Cervical disc disorder with radiculopathy of cervical region [M50.10] 10/31/2012  . Solitary pulmonary nodule [R91.1] 08/19/2012  . Anemia [D64.9] 07/05/2012  . Hypersomnia disorder related to a known organic factor [G47.10] 06/11/2012  . Allergic sinusitis [J30.9] 04/18/2012  . Meningioma ([place]) [D32.9] 11/19/2011  . Mononeuritis leg [G57.90] 10/25/2011  . Carpal tunnel syndrome of right wrist [G56.01] 05/23/2011  . Polypharmacy [Z79.899] 04/28/2011  . Bipolar disorder ([place]) [F31.9] 04/28/2011  . Constipation [K59.00] 04/13/2011  . Falls frequently [R29.6] 12/12/2010  . Oropharyngeal dysphagia [R13.12] 07/12/2010  . Urinary incontinence [R32] 12/16/2009  . HEARING LOSS [H91.90] 10/26/2009  . Hyperlipidemia [E78.5] 12/11/2008  . IBS [K58.9] 12/11/2008  . GERD [K21.9] 07/29/2008  . MILK PRODUCTS ALLERGY [Z91.011] 07/29/2008  . 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 Backache [M54.9] 06/19/2007  . Osteoporosis [M81.0] 06/19/2007  . Obstructive sleep apnea [G47.33] 06/19/2007  . TRIGGER FINGER [M71.50] 04/18/2007  . DIVERTICULOSIS, COLON [K57.30] 11/13/2006    Past Medical History Past Medical History:  Diagnosis Date  . Anemia   . Anxiety    takes Ativan daily  . Arthritis   . Bipolar  disorder ([place])    takes Risperdal nightly  . Blood transfusion   . Cancer ([place])    In her gum  . Carpal tunnel syndrome of right wrist 05/23/2011  . Cervical disc disorder with radiculopathy of cervical region 10/31/2012  . Chronic back pain   . Chronic idiopathic constipation   . Colon polyps   . COPD (chronic obstructive pulmonary disease) with chronic bronchitis ([place]) 09/16/2013   Office Spirometry 10/30/2013-submaximal effort based on appearance of loop and curve. Numbers would fit with severe restriction but her physiologic capability may be better than this. FVC 0.91/44%, and 10.74/45%, FEV1/FVC 0.81, FEF 25-75% 1.43/69%    . Depression    takes Zoloft daily  . Diabetes mellitus    Type II  . Diverticulosis    TCS 9/08 by Dr. Delfin Edis for diarrhea . Bx for micro scopic colitis negative.   . Fibromyalgia   . Frequent falls   . GERD (gastroesophageal reflux disease)    takes Aciphex daily  . Glaucoma    eye drops daily  . Gum symptoms    infection on antibiotic  . Hemiplegia affecting non-dominant side, post-stroke 08/02/2011  . Hyperlipidemia    takes Crestor daily  . Hypertension    takes Amlodipine,Metoprolol,and Clonidine daily  . Hypothyroidism    takes Synthroid  daily  . IBS (irritable bowel syndrome)   . Insomnia    takes Trazodone nightly  . 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 Metabolic encephalopathy 5/95/6387  . Migraines    chronic headaches  . Mononeuritis lower limb   . Narcolepsy   . Osteoporosis   . Pancreatitis 2006   due to Depakote with normal EUS   . Schatzki's ring    non critical / EGD with ED 8/2011with RMR  . Seizures ([place])    takes Lamictal daily.Last seizure 3 yrs ago  . Sleep apnea    on CPAP  . Stroke [facility])    left sided weakness  . Tubular adenoma of colon     Past Surgical History Past Surgical History:  Procedure Laterality Date  . ABDOMINAL HYSTERECTOMY  1978  . BACK SURGERY  July 2012  . BACTERIAL OVERGROWTH TEST N/A  05/05/2013   Procedure: BACTERIAL OVERGROWTH TEST;  Surgeon: Daneil Dolin, MD;  [facility];  Service: Endoscopy;  Laterality: N/A;  7:30  . BIOPSY THYROID  2009  . BRAIN SURGERY  11/2011   resection of meningioma  . BREAST REDUCTION SURGERY  1994  . CARDIAC CATHETERIZATION  05/10/2005   normal coronaries, normal LV systolic function and EF (Dr. Jackie Plum)  . CARPAL TUNNEL RELEASE Left 07/22/04   Dr. Aline Brochure  . CATARACT EXTRACTION Bilateral   . CHOLECYSTECTOMY  1984  . COLONOSCOPY N/A 09/25/2012   [id number]:PPIRJJO diverticulosis.  colonic polyp-removed : tubular adenoma  . CRANIOTOMY  11/23/2011   Procedure: CRANIOTOMY TUMOR EXCISION;  Surgeon: Hosie Spangle, MD;  Location: [facility];  Service: Neurosurgery;  Laterality: N/A;  Craniotomy for tumor resection  . ESOPHAGOGASTRODUODENOSCOPY  12/29/2010   Rourk-Retained food in the esophagus and stomach, small hiatal hernia, status post Maloney dilation of the esophagus  . ESOPHAGOGASTRODUODENOSCOPY N/A 09/25/2012   ACZ:[id number] atonic baggy esophagus status post Maloney dilation 73 F. Hiatal hernia  . GIVENS CAPSULE STUDY N/A 01/15/2013   NORMAL.   . IR GENERIC HISTORICAL  03/17/2016   IR RADIOLOGIST EVAL & MGMT 03/17/2016 [facility]INTER[facility] RAD  . LESION REMOVAL N/A 05/31/2015   Procedure: REMOVAL RIGHT AND LEFT LESIONS OF MANDIBLE;  Surgeon: Diona Browner, DDS;  Location: [place];  Service: Oral Surgery;  Laterality: N/A;  . MALONEY DILATION  12/29/2010   RMR;  . NM MYOCAR PERF WALL MOTION  2006   "relavtiely normal" persantine, mild anterior thinning (breast attenuation artifact), no region of scar/ischemia  . OVARIAN CYST REMOVAL    . RECTOCELE REPAIR N/A 06/29/2015   Procedure: POSTERIOR REPAIR (RECTOCELE);  Surgeon: Jonnie Kind, MD;  Location: [facility];  Service: Gynecology;  Laterality: N/A;  . REDUCTION MAMMAPLASTY Bilateral   . SPINE SURGERY  09/29/2010   Dr. Rolena Infante  . surgical excision of 3 tumors from right thigh and right buttock   and left upper thigh  2010  . TOOTH EXTRACTION Bilateral 12/14/2014   Procedure: REMOVAL OF BILATERAL MANDIBULAR EXOSTOSES;  Surgeon: Diona Browner, DDS;  Location: [place];  Service: Oral Surgery;  Laterality: Bilateral;  . TRANSTHORACIC ECHOCARDIOGRAM  2010   EF 60-65%, mild conc LVH, grade 1 diastolic dysfunction; mildly calcified MV annulus with mildly thickened leaflets, mildly calcified MR annulus    Family History Family History  Problem Relation Age of Onset  . Heart attack Mother        HTN  . Pneumonia Father   . Kidney failure Father   . Diabetes Father   .  Pancreatic cancer Sister   . Diabetes Brother   . Hypertension Brother   . Diabetes Brother   . Cancer Sister        breast   . Hypertension Son   . Sleep apnea Son   . Cancer Sister        pancreatic  . Stroke Maternal Grandmother   . Heart attack Maternal Grandfather   . Alcohol abuse Maternal Uncle   . Colon cancer Neg Hx   . Anesthesia problems Neg Hx   . Hypotension Neg Hx   . Malignant hyperthermia Neg Hx   . Pseudochol deficiency Neg Hx      Social History  reports that she has been smoking cigarettes. She has a 1.75 pack-year smoking history. She has never used smokeless tobacco. She reports that she does not drink alcohol or use drugs.  Medications  Current Outpatient Medications:  .  ACCU-CHEK SMARTVIEW test strip, TEST BLOOD SUGAR FOUR TIMES DAILY AS DIRECTED, Disp: 350 each, Rfl: 2 .  albuterol (PROVENTIL HFA;VENTOLIN HFA) 108 (90 Base) MCG/ACT inhaler, Inhale 1-2 puffs every 6 hours as needed for wheezing, shortness of breath, Disp: 3 Inhaler, Rfl: 3 .  ascorbic acid (VITAMIN C) 500 MG tablet, Take 500 mg by mouth daily., Disp: , Rfl:  .  aspirin EC 81 MG tablet, Take 81 mg by mouth daily., Disp: , Rfl:  .  cetirizine (ZYRTEC) 10 MG tablet, Take 1 tablet (10 mg total) by mouth daily., Disp: 7 tablet, Rfl: 0 .  Cholecalciferol (VITAMIN D3) 5000 units CAPS, Take 1 capsule by mouth daily., Disp: ,  Rfl:  .  cloNIDine (CATAPRES) 0.3 MG tablet, TAKE 1 TABLET BY MOUTH EVERY EIGHT HOURS. ONCE AT 8AM, 4PM, AND 12 MIDNIGHT., Disp: 90 tablet, Rfl: 0 .  cloNIDine (CATAPRES) 0.3 MG tablet, Take one tablet by mouth three times daily at 8 am , 4 pm and 10 pm, Disp: 270 tablet, Rfl: 3 .  cloNIDine (CATAPRES) 0.3 MG tablet, Take 1 tablet (0.3 mg total) by mouth 2 (two) times daily., Disp: 180 tablet, Rfl: 0 .  diclofenac sodium (VOLTAREN) 1 % GEL, Apply 2 g topically daily as needed (pain). Rubs "wherever there is pain" when she doesn't take her pain pill., Disp: 100 g, Rfl: 0 .  dicyclomine (BENTYL) 10 MG capsule, Take 1 tab by mouth twice daily as needed for abdominal cramping., Disp: 60 capsule, Rfl: 6 .  docusate sodium (COLACE) 100 MG capsule, Take 100 mg by mouth 2 (two) times daily., Disp: , Rfl:  .  hydroxychloroquine (PLAQUENIL) 200 MG tablet, Take 200 mg by mouth daily. , Disp: , Rfl:  .  insulin aspart (NOVOLOG FLEXPEN) 100 UNIT/ML FlexPen, INJECT 10 TO 14 UNITS INTO THE SKIN 3 (THREE) TIMES DAILY WITH MEALS., Disp: 45 mL, Rfl: 3 .  Insulin Glargine (TOUJEO SOLOSTAR) 300 UNIT/ML SOPN, Inject 20 Units into the skin at bedtime., Disp: 9 pen, Rfl: 3 .  lamoTRIgine (LAMICTAL) 100 MG tablet, Take 1 tablet (100 mg total) by mouth 2 (two) times daily., Disp: 180 tablet, Rfl: 3 .  levothyroxine (SYNTHROID, LEVOTHROID) 50 MCG tablet, TAKE 1 TABLET BY MOUTH ONCE DAILY AND 1/2 TABLET ON SUNDAYS., Disp: 30 tablet, Rfl: 5 .  lidocaine (XYLOCAINE) 2 % jelly, APPLY TO LEFT ABDOMINAL WALL TWICE DAILY., Disp: 30 mL, Rfl: 0 .  LORazepam (ATIVAN) 0.5 MG tablet, Take 1 tablet (0.5 mg total) by mouth 3 (three) times daily., Disp: 270 tablet, Rfl: 2 .  losartan (COZAAR)  50 MG tablet, Take 1 tablet (50 mg total) by mouth daily., Disp: 90 tablet, Rfl: 1 .  meclizine (ANTIVERT) 25 MG tablet, Take 25 mg by mouth 3 (three) times daily as needed for dizziness. 07/10/17 Has medication and reports not taking often,  but says  she takes if dizzy., Disp: , Rfl:  .  metoprolol tartrate (LOPRESSOR) 50 MG tablet, TAKE 1 TABLET BY MOUTH TWICE DAILY., Disp: 60 tablet, Rfl: 9 .  montelukast (SINGULAIR) 10 MG tablet, Take 1 tablet (10 mg total) by mouth daily., Disp: 90 tablet, Rfl: 1 .  Multiple Vitamins-Minerals (HM MULTIVITAMIN ADULT GUMMY PO), Take 1 tablet by mouth daily., Disp: , Rfl:  .  nicotine (NICOTROL) 10 MG inhaler, Inhale 1 Cartridge (1 continuous puffing total) into the lungs as needed for smoking cessation., Disp: 42 each, Rfl: 0 .  NYAMYC powder, APPLY TO AFFECTED AREA FOUR TIMES DAILY., Disp: 30 g, Rfl: 0 .  nystatin (NYAMYC) powder, APPLY TO AFFECTED AREA 4 TIMES DAILY., Disp: 30 g, Rfl: 2 .  oxyCODONE-acetaminophen (PERCOCET/ROXICET) 5-325 MG tablet, Take by mouth as needed., Disp: , Rfl:  .  polyethylene glycol (MIRALAX) packet, Take 17 g by mouth daily., Disp: 14 each, Rfl: 0 .  pregabalin (LYRICA) 75 MG capsule, Take 1 capsule (75 mg total) by mouth daily., Disp: , Rfl:  .  RABEprazole (ACIPHEX) 20 MG tablet, Take 1 tablet (20 mg total) by mouth 2 (two) times daily., Disp: 180 tablet, Rfl: 3 .  RESTASIS 0.05 % ophthalmic emulsion, Place 1 drop into both eyes 2 (two) times daily. , Disp: , Rfl:  .  risperiDONE (RISPERDAL) 0.5 MG tablet, Take 1 tablet (0.5 mg total) by mouth at bedtime., Disp: 90 tablet, Rfl: 2 .  rosuvastatin (CRESTOR) 5 MG tablet, Take 1 tablet (5 mg total) by mouth at bedtime., Disp: 90 tablet, Rfl: 1 .  sertraline (ZOLOFT) 100 MG tablet, Take 1 tablet (100 mg total) by mouth daily., Disp: 90 tablet, Rfl: 2 .  traZODone (DESYREL) 100 MG tablet, Take 1 tablet (100 mg total) by mouth at bedtime., Disp: 90 tablet, Rfl: 2 .  triamcinolone ointment (KENALOG) 0.1 %, Apply 1 application topically at bedtime. Apply to elbows at bedtime., Disp: , Rfl:  .  UNABLE TO FIND, Boost diabetic nutrition drinks dx e11.9   Pullups- use as needed daily dx N39.498, Disp: 1 each, Rfl: 11 .  UNABLE TO FIND,  Lift chair x 1  DX, Disp: 1 each, Rfl: 0  Allergies Cephalexin; Iron; Milk-related compounds; Penicillins; and Phenazopyridine hcl  Review of Systems Review of Systems - Oncology ROS negative other than fatigue and SOB.     Physical Exam  Vitals Wt Readings from Last 3 Encounters:  10/23/17 156 lb 4.8 oz (70.9 kg)  10/08/17 156 lb 3.2 oz (70.9 kg)  10/08/17 145 lb (65.8 kg)   Temp Readings from Last 3 Encounters:  10/23/17 98.7 F (37.1 C) (Oral)  08/10/17 98.2 F (36.8 C) (Oral)  07/12/17 (!) 97.2 F (36.2 C) (Oral)   BP Readings from Last 3 Encounters:  10/23/17 (!) 126/41  10/08/17 118/60  10/08/17 (!) 104/46   Pulse Readings from Last 3 Encounters:  10/23/17 65  10/08/17 70  10/08/17 70    Constitutional: Well-developed, well-nourished, and in no distress.   HENT: Head: Normocephalic and atraumatic.  Mouth/Throat: No oropharyngeal exudate. Mucosa moist. Eyes: Pupils are equal, round, and reactive to light. Conjunctivae are normal. No scleral icterus.  Neck: Normal range of motion.  Neck supple. No JVD present.  Cardiovascular: Normal rate, regular rhythm and normal heart sounds.  Exam reveals no gallop and no friction rub.   No murmur heard. Pulmonary/Chest: Effort normal and breath sounds normal. No respiratory distress. No wheezes.No rales.  Abdominal: Soft. Bowel sounds are normal. No distension. There is no tenderness. There is no guarding.  Musculoskeletal: No edema or tenderness.  Lymphadenopathy: No cervical, axillary or supraclavicular adenopathy.  Neurological: Alert and oriented to person, place, and time.  Neurological deficits noted with speech from prior stroke.   Skin: Skin is warm and dry. No rash noted. No erythema. No pallor.  Psychiatric: Affect and judgment normal.   Labs No visits with results within 3 Day(s) from this visit.  Latest known visit with results is:  Appointment on 10/15/2017  Component Date Value Ref Range Status  . WBC  10/15/2017 7.1  4.0 - 10.5 K/uL Final  . RBC 10/15/2017 4.70  3.87 - 5.11 MIL/uL Final  . Hemoglobin 10/15/2017 10.7* 12.0 - 15.0 g/dL Final  . HCT 10/15/2017 36.4  36.0 - 46.0 % Final  . MCV 10/15/2017 77.4* 78.0 - 100.0 fL Final  . MCH 10/15/2017 22.8* 26.0 - 34.0 pg Final  . MCHC 10/15/2017 29.4* 30.0 - 36.0 g/dL Final  . RDW 10/15/2017 14.3  11.5 - 15.5 % Final  . Platelets 10/15/2017 199  150 - 400 K/uL Final  . Neutrophils Relative % 10/15/2017 56  % Final  . Neutro Abs 10/15/2017 4.0  1.7 - 7.7 K/uL Final  . Lymphocytes Relative 10/15/2017 33  % Final  . Lymphs Abs 10/15/2017 2.3  0.7 - 4.0 K/uL Final  . Monocytes Relative 10/15/2017 8  % Final  . Monocytes Absolute 10/15/2017 0.6  0.1 - 1.0 K/uL Final  . Eosinophils Relative 10/15/2017 3  % Final  . Eosinophils Absolute 10/15/2017 0.2  0.0 - 0.7 K/uL Final  . Basophils Relative 10/15/2017 0  % Final  . Basophils Absolute 10/15/2017 0.0  0.0 - 0.1 K/uL Final   Performed at [facility], [address], [place], [place] [address]  . Sodium 10/15/2017 142  135 - 145 mmol/L Final  . Potassium 10/15/2017 3.9  3.5 - 5.1 mmol/L Final  . Chloride 10/15/2017 112* 98 - 111 mmol/L Final  . CO2 10/15/2017 24  22 - 32 mmol/L Final  . Glucose, Bld 10/15/2017 105* 70 - 99 mg/dL Final  . BUN 10/15/2017 33* 8 - 23 mg/dL Final  . Creatinine, Ser 10/15/2017 1.06* 0.44 - 1.00 mg/dL Final  . Calcium 10/15/2017 9.1  8.9 - 10.3 mg/dL Final  . Total Protein 10/15/2017 6.9  6.5 - 8.1 g/dL Final  . Albumin 10/15/2017 3.7  3.5 - 5.0 g/dL Final  . AST 10/15/2017 22  15 - 41 U/L Final  . ALT 10/15/2017 27  0 - 44 U/L Final  . Alkaline Phosphatase 10/15/2017 95  38 - 126 U/L Final  . Total Bilirubin 10/15/2017 0.5  0.3 - 1.2 mg/dL Final  . GFR calc non Af Amer 10/15/2017 53* >60 mL/min Final  . GFR calc Af Amer 10/15/2017 >60  >60 mL/min Final   Comment: (NOTE) The eGFR has been calculated using the CKD EPI equation. This calculation has not been  validated in all clinical situations. eGFR's persistently <60 mL/min signify possible Chronic Kidney Disease.   Georgiann Hahn gap 10/15/2017 6  5 - 15 Final   Performed at [facility], [address], [place], [place] [address]  . LDH  10/15/2017 177  98 - 192 U/L Final   Performed at [facility], [address], [place], [place] [address]  . Ferritin 10/15/2017 170  11 - 307 ng/mL Final   Performed at [facility], [place] [address] [place], [place] [address]  . Prothrombin Time 10/15/2017 13.2  11.4 - 15.2 seconds Final  . INR 10/15/2017 1.01   Final   Performed at [facility], [address] [place], [place] [address]  . aPTT 10/15/2017 24  24 - 36 seconds Final   Performed at [facility], [address] [place],  [address]     Pathology Orders Placed This Encounter  Procedures  . CT Chest W Contrast    Standing Status:   Future    Standing Expiration Date:   10/23/2018    Order Specific Question:   If indicated for the ordered procedure, I authorize the administration of contrast media per Radiology protocol    Answer:   Yes    Order Specific Question:   Preferred imaging location?    Answer:   [facility]    Order Specific Question:   Radiology Contrast Protocol - do NOT remove file path    Answer:   [email or web address]  . CBC with Differential/Platelet    Standing Status:   Future    Standing Expiration Date:   10/24/2019  . Comprehensive metabolic panel    Standing Status:   Future    Standing Expiration Date:   10/24/2019  . Lactate dehydrogenase    Standing Status:   Future    Standing Expiration Date:   10/24/2019  . Ferritin    Standing Status:   Future    Standing Expiration Date:   10/24/2019       Zoila Shutter MD

## 2017-10-23 NOTE — Telephone Encounter (Signed)
Called the patient and advised we faxed a request for refills to [facility] for Rabeprazole 20 mg tablets,  On Thursday 10-16-2017 per Nicoletta Ba PA. ( Twice daily dosing, # [phone] with 3 refills).  I received a duplicate request today 8-13 and called [employer] and the medication was mailed out for the patient. She should be getting it this week, 10-22-17.

## 2017-10-24 ENCOUNTER — Telehealth: Payer: Self-pay | Admitting: Family Medicine

## 2017-10-24 DIAGNOSIS — R260 Ataxic gait: Secondary | ICD-10-CM | POA: Diagnosis not present

## 2017-10-24 DIAGNOSIS — M5442 Lumbago with sciatica, left side: Secondary | ICD-10-CM | POA: Diagnosis not present

## 2017-10-24 DIAGNOSIS — M4716 Other spondylosis with myelopathy, lumbar region: Secondary | ICD-10-CM | POA: Diagnosis not present

## 2017-10-24 DIAGNOSIS — E119 Type 2 diabetes mellitus without complications: Secondary | ICD-10-CM | POA: Diagnosis not present

## 2017-10-24 DIAGNOSIS — G8929 Other chronic pain: Secondary | ICD-10-CM | POA: Diagnosis not present

## 2017-10-24 DIAGNOSIS — F060 Psychotic disorder with hallucinations due to known physiological condition: Secondary | ICD-10-CM | POA: Diagnosis not present

## 2017-10-24 DIAGNOSIS — M542 Cervicalgia: Secondary | ICD-10-CM | POA: Diagnosis not present

## 2017-10-24 DIAGNOSIS — M5136 Other intervertebral disc degeneration, lumbar region: Secondary | ICD-10-CM | POA: Diagnosis not present

## 2017-10-24 DIAGNOSIS — Z794 Long term (current) use of insulin: Secondary | ICD-10-CM | POA: Diagnosis not present

## 2017-10-24 NOTE — Telephone Encounter (Signed)
Calling to report that PT Fell around 4am -she had a sharp pain in her back that caused her to fall to her Knees. Pain is 8 of 10--all vitals are normal.. I advised to tell PT that we are praying for her, and to call us if she needed anything

## 2017-10-25 DIAGNOSIS — D329 Benign neoplasm of meninges, unspecified: Secondary | ICD-10-CM | POA: Diagnosis not present

## 2017-10-28 ENCOUNTER — Emergency Department (HOSPITAL_COMMUNITY): Payer: Medicare HMO

## 2017-10-28 ENCOUNTER — Encounter (HOSPITAL_COMMUNITY): Payer: Self-pay | Admitting: Emergency Medicine

## 2017-10-28 ENCOUNTER — Other Ambulatory Visit: Payer: Self-pay

## 2017-10-28 ENCOUNTER — Observation Stay (HOSPITAL_COMMUNITY)
Admission: EM | Admit: 2017-10-28 | Discharge: 2017-10-30 | Disposition: A | Discharge status: Home / Self Care | Payer: Medicare HMO | Attending: Internal Medicine | Admitting: Internal Medicine

## 2017-10-28 DIAGNOSIS — E1165 Type 2 diabetes mellitus with hyperglycemia: Secondary | ICD-10-CM | POA: Diagnosis not present

## 2017-10-28 DIAGNOSIS — R29898 Other symptoms and signs involving the musculoskeletal system: Secondary | ICD-10-CM

## 2017-10-28 DIAGNOSIS — Z794 Long term (current) use of insulin: Secondary | ICD-10-CM

## 2017-10-28 DIAGNOSIS — G894 Chronic pain syndrome: Secondary | ICD-10-CM | POA: Diagnosis not present

## 2017-10-28 DIAGNOSIS — S0990XA Unspecified injury of head, initial encounter: Secondary | ICD-10-CM | POA: Diagnosis not present

## 2017-10-28 DIAGNOSIS — F319 Bipolar disorder, unspecified: Secondary | ICD-10-CM | POA: Insufficient documentation

## 2017-10-28 DIAGNOSIS — Z720 Tobacco use: Secondary | ICD-10-CM | POA: Diagnosis present

## 2017-10-28 DIAGNOSIS — E039 Hypothyroidism, unspecified: Secondary | ICD-10-CM | POA: Insufficient documentation

## 2017-10-28 DIAGNOSIS — Z79899 Other long term (current) drug therapy: Secondary | ICD-10-CM | POA: Diagnosis not present

## 2017-10-28 DIAGNOSIS — E119 Type 2 diabetes mellitus without complications: Secondary | ICD-10-CM | POA: Insufficient documentation

## 2017-10-28 DIAGNOSIS — R4182 Altered mental status, unspecified: Secondary | ICD-10-CM

## 2017-10-28 DIAGNOSIS — Z7982 Long term (current) use of aspirin: Secondary | ICD-10-CM | POA: Diagnosis not present

## 2017-10-28 DIAGNOSIS — M5442 Lumbago with sciatica, left side: Secondary | ICD-10-CM | POA: Diagnosis not present

## 2017-10-28 DIAGNOSIS — I10 Essential (primary) hypertension: Secondary | ICD-10-CM

## 2017-10-28 DIAGNOSIS — F39 Unspecified mood [affective] disorder: Secondary | ICD-10-CM | POA: Diagnosis present

## 2017-10-28 DIAGNOSIS — J449 Chronic obstructive pulmonary disease, unspecified: Secondary | ICD-10-CM | POA: Diagnosis not present

## 2017-10-28 DIAGNOSIS — Y92009 Unspecified place in unspecified non-institutional (private) residence as the place of occurrence of the external cause: Secondary | ICD-10-CM

## 2017-10-28 DIAGNOSIS — F17210 Nicotine dependence, cigarettes, uncomplicated: Secondary | ICD-10-CM | POA: Diagnosis not present

## 2017-10-28 DIAGNOSIS — R531 Weakness: Principal | ICD-10-CM | POA: Insufficient documentation

## 2017-10-28 DIAGNOSIS — W19XXXA Unspecified fall, initial encounter: Secondary | ICD-10-CM | POA: Diagnosis not present

## 2017-10-28 DIAGNOSIS — R42 Dizziness and giddiness: Secondary | ICD-10-CM | POA: Diagnosis not present

## 2017-10-28 DIAGNOSIS — F17200 Nicotine dependence, unspecified, uncomplicated: Secondary | ICD-10-CM | POA: Diagnosis present

## 2017-10-28 DIAGNOSIS — G40909 Epilepsy, unspecified, not intractable, without status epilepticus: Secondary | ICD-10-CM | POA: Diagnosis not present

## 2017-10-28 DIAGNOSIS — M6281 Muscle weakness (generalized): Secondary | ICD-10-CM | POA: Diagnosis present

## 2017-10-28 DIAGNOSIS — G43009 Migraine without aura, not intractable, without status migrainosus: Secondary | ICD-10-CM

## 2017-10-28 DIAGNOSIS — R569 Unspecified convulsions: Secondary | ICD-10-CM

## 2017-10-28 HISTORY — DX: Other chronic pain: M54.2

## 2017-10-28 HISTORY — DX: Need for assistance with personal care: Z74.1

## 2017-10-28 HISTORY — DX: Other chronic pain: G89.29

## 2017-10-28 HISTORY — DX: Dorsalgia, unspecified: M54.9

## 2017-10-28 LAB — CBC WITH DIFFERENTIAL/PLATELET
BASOS ABS: 0 10*3/uL (ref 0.0–0.1)
Basophils Relative: 0 %
EOS ABS: 0.2 10*3/uL (ref 0.0–0.7)
Eosinophils Relative: 2 %
HEMATOCRIT: 37.7 % (ref 36.0–46.0)
Hemoglobin: 11.3 g/dL — ABNORMAL LOW (ref 12.0–15.0)
Lymphocytes Relative: 26 %
Lymphs Abs: 1.8 10*3/uL (ref 0.7–4.0)
MCH: 23.3 pg — ABNORMAL LOW (ref 26.0–34.0)
MCHC: 30 g/dL (ref 30.0–36.0)
MCV: 77.6 fL — ABNORMAL LOW (ref 78.0–100.0)
MONO ABS: 0.6 10*3/uL (ref 0.1–1.0)
Monocytes Relative: 8 %
NEUTROS ABS: 4.4 10*3/uL (ref 1.7–7.7)
NEUTROS PCT: 64 %
Platelets: 210 10*3/uL (ref 150–400)
RBC: 4.86 MIL/uL (ref 3.87–5.11)
RDW: 14.4 % (ref 11.5–15.5)
WBC: 6.9 10*3/uL (ref 4.0–10.5)

## 2017-10-28 LAB — BASIC METABOLIC PANEL
ANION GAP: 10 (ref 5–15)
BUN: 21 mg/dL (ref 8–23)
CALCIUM: 9.6 mg/dL (ref 8.9–10.3)
CO2: 26 mmol/L (ref 22–32)
Chloride: 107 mmol/L (ref 98–111)
Creatinine, Ser: 1.23 mg/dL — ABNORMAL HIGH (ref 0.44–1.00)
GFR calc Af Amer: 51 mL/min — ABNORMAL LOW (ref >60)
GFR calc non Af Amer: 44 mL/min — ABNORMAL LOW (ref >60)
Glucose, Bld: 138 mg/dL — ABNORMAL HIGH (ref 70–99)
POTASSIUM: 3.9 mmol/L (ref 3.5–5.1)
SODIUM: 143 mmol/L (ref 135–145)

## 2017-10-28 LAB — URINALYSIS, ROUTINE W REFLEX MICROSCOPIC
BILIRUBIN URINE: NEGATIVE
Glucose, UA: NEGATIVE mg/dL
Hgb urine dipstick: NEGATIVE
KETONES UR: NEGATIVE mg/dL
Nitrite: NEGATIVE
PROTEIN: NEGATIVE mg/dL
Specific Gravity, Urine: 1.019 (ref 1.005–1.030)
pH: 5 (ref 5.0–8.0)

## 2017-10-28 LAB — GLUCOSE, CAPILLARY
GLUCOSE-CAPILLARY: 125 mg/dL — AB (ref 70–99)
Glucose-Capillary: 129 mg/dL — ABNORMAL HIGH (ref 70–99)

## 2017-10-28 LAB — VITAMIN B12: VITAMIN B 12: 1616 pg/mL — AB (ref 180–914)

## 2017-10-28 LAB — LACTIC ACID, PLASMA: Lactic Acid, Venous: 1.3 mmol/L (ref 0.5–1.9)

## 2017-10-28 LAB — TROPONIN I

## 2017-10-28 LAB — CK: CK TOTAL: 210 U/L (ref 38–234)

## 2017-10-28 MED ORDER — VITAMIN D 1000 UNITS PO TABS
5000.0000 [IU] | ORAL_TABLET | Freq: Every day | ORAL | Status: DC
  Administered 2017-10-28 – 2017-10-30 (×3): 5000 [IU] via ORAL
  Filled 2017-10-28 (×3): qty 5

## 2017-10-28 MED ORDER — ROSUVASTATIN CALCIUM 10 MG PO TABS
5.0000 mg | ORAL_TABLET | Freq: Every day | ORAL | Status: DC
  Administered 2017-10-28 – 2017-10-29 (×2): 5 mg via ORAL
  Filled 2017-10-28 (×2): qty 1

## 2017-10-28 MED ORDER — ONDANSETRON HCL 4 MG PO TABS: 4.0000 mg | ORAL_TABLET | Freq: Four times a day (QID) | ORAL | Status: DC | PRN

## 2017-10-28 MED ORDER — ASPIRIN EC 81 MG PO TBEC
81.0000 mg | DELAYED_RELEASE_TABLET | Freq: Every day | ORAL | Status: DC
  Administered 2017-10-28 – 2017-10-30 (×3): 81 mg via ORAL
  Filled 2017-10-28 (×3): qty 1

## 2017-10-28 MED ORDER — NICOTINE 14 MG/24HR TD PT24
14.0000 mg | MEDICATED_PATCH | Freq: Every day | TRANSDERMAL | Status: DC
  Administered 2017-10-28 – 2017-10-30 (×3): 14 mg via TRANSDERMAL
  Filled 2017-10-28 (×3): qty 1

## 2017-10-28 MED ORDER — LAMOTRIGINE 100 MG PO TABS
100.0000 mg | ORAL_TABLET | Freq: Two times a day (BID) | ORAL | Status: DC
  Administered 2017-10-28 – 2017-10-30 (×5): 100 mg via ORAL
  Filled 2017-10-28 (×5): qty 1

## 2017-10-28 MED ORDER — ACETAMINOPHEN 325 MG PO TABS
650.0000 mg | ORAL_TABLET | Freq: Four times a day (QID) | ORAL | Status: DC | PRN
  Administered 2017-10-28 – 2017-10-30 (×3): 650 mg via ORAL
  Filled 2017-10-28 (×3): qty 2

## 2017-10-28 MED ORDER — ONDANSETRON HCL 4 MG/2ML IJ SOLN: 4.0000 mg | Freq: Four times a day (QID) | INTRAMUSCULAR | Status: DC | PRN

## 2017-10-28 MED ORDER — ALBUTEROL SULFATE HFA 108 (90 BASE) MCG/ACT IN AERS: 2.0000 | INHALATION_SPRAY | Freq: Four times a day (QID) | RESPIRATORY_TRACT | Status: DC | PRN

## 2017-10-28 MED ORDER — ENOXAPARIN SODIUM 40 MG/0.4ML ~~LOC~~ SOLN
40.0000 mg | SUBCUTANEOUS | Status: DC
  Administered 2017-10-28 – 2017-10-29 (×2): 40 mg via SUBCUTANEOUS
  Filled 2017-10-28 (×2): qty 0.4

## 2017-10-28 MED ORDER — ALBUTEROL SULFATE (2.5 MG/3ML) 0.083% IN NEBU: 2.5000 mg | INHALATION_SOLUTION | Freq: Four times a day (QID) | RESPIRATORY_TRACT | Status: DC | PRN

## 2017-10-28 MED ORDER — CLONIDINE HCL 0.2 MG PO TABS
0.3000 mg | ORAL_TABLET | Freq: Two times a day (BID) | ORAL | Status: DC
  Administered 2017-10-28 (×2): 0.3 mg via ORAL
  Filled 2017-10-28 (×2): qty 1

## 2017-10-28 MED ORDER — VITAMIN C 500 MG PO TABS
500.0000 mg | ORAL_TABLET | Freq: Every day | ORAL | Status: DC
  Administered 2017-10-28: 500 mg via ORAL
  Filled 2017-10-28: qty 1

## 2017-10-28 MED ORDER — LOSARTAN POTASSIUM 50 MG PO TABS
50.0000 mg | ORAL_TABLET | Freq: Every day | ORAL | Status: DC
  Administered 2017-10-28 – 2017-10-30 (×3): 50 mg via ORAL
  Filled 2017-10-28 (×3): qty 1

## 2017-10-28 MED ORDER — ACETAMINOPHEN 650 MG RE SUPP: 650.0000 mg | Freq: Four times a day (QID) | RECTAL | Status: DC | PRN

## 2017-10-28 MED ORDER — LEVOTHYROXINE SODIUM 50 MCG PO TABS
50.0000 ug | ORAL_TABLET | Freq: Every day | ORAL | Status: DC
  Administered 2017-10-29 – 2017-10-30 (×2): 50 ug via ORAL
  Filled 2017-10-28 (×2): qty 1

## 2017-10-28 MED ORDER — SERTRALINE HCL 50 MG PO TABS
100.0000 mg | ORAL_TABLET | Freq: Every day | ORAL | Status: DC
  Administered 2017-10-28 – 2017-10-30 (×3): 100 mg via ORAL
  Filled 2017-10-28 (×3): qty 2

## 2017-10-28 MED ORDER — LORAZEPAM 0.5 MG PO TABS
0.5000 mg | ORAL_TABLET | Freq: Three times a day (TID) | ORAL | Status: DC
  Administered 2017-10-28 – 2017-10-30 (×6): 0.5 mg via ORAL
  Filled 2017-10-28 (×6): qty 1

## 2017-10-28 MED ORDER — METOPROLOL TARTRATE 50 MG PO TABS
50.0000 mg | ORAL_TABLET | Freq: Two times a day (BID) | ORAL | Status: DC
  Administered 2017-10-28 – 2017-10-30 (×5): 50 mg via ORAL
  Filled 2017-10-28 (×5): qty 1

## 2017-10-28 MED ORDER — POLYETHYLENE GLYCOL 3350 17 G PO PACK
17.0000 g | PACK | Freq: Every day | ORAL | Status: DC
  Administered 2017-10-28 – 2017-10-30 (×2): 17 g via ORAL
  Filled 2017-10-28 (×3): qty 1

## 2017-10-28 MED ORDER — HYDROXYCHLOROQUINE SULFATE 200 MG PO TABS
200.0000 mg | ORAL_TABLET | Freq: Every day | ORAL | Status: DC
  Administered 2017-10-28 – 2017-10-30 (×3): 200 mg via ORAL
  Filled 2017-10-28 (×3): qty 1

## 2017-10-28 MED ORDER — DOCUSATE SODIUM 100 MG PO CAPS
100.0000 mg | ORAL_CAPSULE | Freq: Two times a day (BID) | ORAL | Status: DC
  Administered 2017-10-28 (×2): 100 mg via ORAL
  Filled 2017-10-28 (×2): qty 1

## 2017-10-28 MED ORDER — SODIUM CHLORIDE 0.9 % IV SOLN
INTRAVENOUS | Status: AC
  Administered 2017-10-28 – 2017-10-29 (×2): via INTRAVENOUS

## 2017-10-28 MED ORDER — LORATADINE 10 MG PO TABS
10.0000 mg | ORAL_TABLET | Freq: Every day | ORAL | Status: DC
  Administered 2017-10-28: 10 mg via ORAL
  Filled 2017-10-28: qty 1

## 2017-10-28 MED ORDER — PREGABALIN 75 MG PO CAPS
75.0000 mg | ORAL_CAPSULE | Freq: Every day | ORAL | Status: DC
  Administered 2017-10-28 – 2017-10-30 (×3): 75 mg via ORAL
  Filled 2017-10-28 (×3): qty 1

## 2017-10-28 MED ORDER — PANTOPRAZOLE SODIUM 40 MG PO TBEC
40.0000 mg | DELAYED_RELEASE_TABLET | Freq: Every day | ORAL | Status: DC
  Administered 2017-10-28 – 2017-10-30 (×3): 40 mg via ORAL
  Filled 2017-10-28 (×3): qty 1

## 2017-10-28 MED ORDER — RISPERIDONE 0.5 MG PO TABS
0.5000 mg | ORAL_TABLET | Freq: Every day | ORAL | Status: DC
  Administered 2017-10-28 – 2017-10-29 (×2): 0.5 mg via ORAL
  Filled 2017-10-28 (×2): qty 1

## 2017-10-28 MED ORDER — CLONIDINE HCL 0.2 MG PO TABS
0.3000 mg | ORAL_TABLET | Freq: Once | ORAL | Status: AC
  Administered 2017-10-28: 0.3 mg via ORAL
  Filled 2017-10-28: qty 1

## 2017-10-28 MED ORDER — MONTELUKAST SODIUM 10 MG PO TABS
10.0000 mg | ORAL_TABLET | Freq: Every day | ORAL | Status: DC
  Administered 2017-10-28 – 2017-10-30 (×3): 10 mg via ORAL
  Filled 2017-10-28 (×3): qty 1

## 2017-10-28 MED ORDER — CYCLOSPORINE 0.05 % OP EMUL
1.0000 [drp] | Freq: Two times a day (BID) | OPHTHALMIC | Status: DC
  Administered 2017-10-28 – 2017-10-30 (×5): 1 [drp] via OPHTHALMIC
  Filled 2017-10-28 (×4): qty 1

## 2017-10-28 NOTE — ED Notes (Signed)
Phlebotomy at bedside.

## 2017-10-28 NOTE — ED Notes (Signed)
Patient transported to CT and xray 

## 2017-10-28 NOTE — Progress Notes (Signed)
Called and cancelled pt's appointment at 8:15 am tomorrow. with Dr. Nelva Bush in [place] since she is admitted to the hospital.

## 2017-10-28 NOTE — ED Notes (Signed)
EDP at bedside  

## 2017-10-28 NOTE — ED Provider Notes (Signed)
[facility] EMERGENCY DEPARTMENT Provider Note   CSN: [id number] Arrival date & time: 10/28/17  3220     History   Chief Complaint Chief Complaint  Patient presents with  . Fall    HPI [name] is a 67 y.o. female.  HPI  Pt was seen at 0740. Per EMS and pt report, c/o gradual onset and persistence of constant generalized weakness that was noticed this morning when she tried to stand up and walk. Pt states she walks with a cane/walker at baseline. States her legs are "too weak" and she cannot stand or walk. EMS states call out was for fall, and they noted that pt was unable to walk while they were on scene. Pt otherwise denies any specific complaint. Denies neck pain, no change in chronic back pain, no focal motor weakness, no tingling/numbness in extremities, no CP/palpitations, no SOB/cough, no abd pain, no N/V/D, no fevers, no rash.    Past Medical History:  Diagnosis Date  . Anemia   . Anxiety    takes Ativan daily  . Arthritis   . Assistance needed for mobility   . Bipolar disorder ([place])    takes Risperdal nightly  . Blood transfusion   . Cancer ([place])    In her gum  . Carpal tunnel syndrome of right wrist 05/23/2011  . Cervical disc disorder with radiculopathy of cervical region 10/31/2012  . Chronic back pain   . Chronic idiopathic constipation   . Chronic neck and back pain   . Colon polyps   . COPD (chronic obstructive pulmonary disease) with chronic bronchitis ([place]) 09/16/2013   Office Spirometry 10/30/2013-submaximal effort based on appearance of loop and curve. Numbers would fit with severe restriction but her physiologic capability may be better than this. FVC 0.91/44%, and 10.74/45%, FEV1/FVC 0.81, FEF 25-75% 1.43/69%    . Depression    takes Zoloft daily  . Diabetes mellitus    Type II  . Diverticulosis    TCS 9/08 by Dr. Delfin Edis for diarrhea . Bx for micro scopic colitis negative.   . Fibromyalgia   . Frequent falls   . GERD (gastroesophageal  reflux disease)    takes Aciphex daily  . Glaucoma    eye drops daily  . Gum symptoms    infection on antibiotic  . Hemiplegia affecting non-dominant side, post-stroke 08/02/2011  . Hyperlipidemia    takes Crestor daily  . Hypertension    takes Amlodipine,Metoprolol,and Clonidine daily  . Hypothyroidism    takes Synthroid daily  . IBS (irritable bowel syndrome)   . Insomnia    takes Trazodone nightly  . 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 Metabolic encephalopathy 2/54/2706  . Migraines    chronic headaches  . Mononeuritis lower limb   . Narcolepsy   . Osteoporosis   . Pancreatitis 2006   due to Depakote with normal EUS   . Schatzki's ring    non critical / EGD with ED 8/2011with RMR  . Seizures ([place])    takes Lamictal daily.Last seizure 3 yrs ago  . Sleep apnea    on CPAP  . Stroke [facility])    left sided weakness, speech changes  . Tubular adenoma of colon     Patient Active Problem List   Diagnosis Date Noted  . Lumbar spondylosis with myelopathy 10/12/2017  . At risk for cardiovascular event 06/10/2017  . Rash and nonspecific skin eruption 04/25/2017  . Hypotension   . Diabetic polyneuropathy associated with type 2 diabetes mellitus ([place])  05/19/2016  . Tobacco user 04/24/2016  . History of palpitations 08/09/2015  . Nausea without vomiting 08/09/2015  . Labile hypertension 08/03/2015  . Normal coronary arteries 08/03/2015  . Left-sided low back pain with left-sided sciatica 06/27/2015  . Type 2 diabetes mellitus with hyperglycemia, with long-term current use of insulin ([place]) 05/06/2015  . Multinodular goiter 05/06/2015  . Rectocele, female 04/27/2015  . Anal sphincter incontinence 04/27/2015  . Pelvic relaxation due to rectocele 03/30/2015  . Pulmonary hypertension ([place]) 02/22/2015  . Light cigarette smoker (1-9 cigarettes per day) 01/11/2015  . Migraine without aura and without status migrainosus, not intractable 07/02/2014  . Flatulence 02/18/2014  .  Microcytic anemia 02/18/2014  . COPD (chronic obstructive pulmonary disease) with chronic bronchitis ([place]) 09/16/2013  . Hypothyroidism 08/16/2013  . Gastroparesis 04/28/2013  . Seizure disorder ([place]) 01/19/2013  . Cervical disc disorder with radiculopathy of cervical region 10/31/2012  . Solitary pulmonary nodule 08/19/2012  . Anemia 07/05/2012  . 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 Allergic sinusitis 04/18/2012  . Meningioma ([place]) 11/19/2011  . 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 Carpal tunnel syndrome of right wrist 05/23/2011  . Polypharmacy 04/28/2011  . Bipolar disorder ([place]) 04/28/2011  . Constipation 04/13/2011  . Falls frequently 12/12/2010  . Oropharyngeal dysphagia 07/12/2010  . Urinary incontinence 12/16/2009  . HEARING LOSS 10/26/2009  . Hyperlipidemia 12/11/2008  . IBS 12/11/2008  . GERD 07/29/2008  . MILK PRODUCTS ALLERGY 07/29/2008  . 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 Backache 06/19/2007  . Osteoporosis 06/19/2007  . Obstructive sleep apnea 06/19/2007  . TRIGGER FINGER 04/18/2007  . DIVERTICULOSIS, COLON 11/13/2006    Past Surgical History:  Procedure Laterality Date  . ABDOMINAL HYSTERECTOMY  1978  . BACK SURGERY  July 2012  . BACTERIAL OVERGROWTH TEST N/A 05/05/2013   Procedure: BACTERIAL OVERGROWTH TEST;  Surgeon: Daneil Dolin, MD;  [facility];  Service: Endoscopy;  Laterality: N/A;  7:30  . BIOPSY THYROID  2009  . BRAIN SURGERY  11/2011   resection of meningioma  . BREAST REDUCTION SURGERY  1994  . CARDIAC CATHETERIZATION  05/10/2005   normal coronaries, normal LV systolic function and EF (Dr. Jackie Plum)  . CARPAL TUNNEL RELEASE Left 07/22/04   Dr. Aline Brochure  . CATARACT EXTRACTION Bilateral   . CHOLECYSTECTOMY  1984  . COLONOSCOPY N/A 09/25/2012   FBP:ZWCHENI diverticulosis.  colonic polyp-removed : tubular adenoma  . CRANIOTOMY  11/23/2011   Procedure: CRANIOTOMY  TUMOR EXCISION;  Surgeon: Hosie Spangle, MD;  Location: [place] NEURO ORS;  Service: Neurosurgery;  Laterality: N/A;  Craniotomy for tumor resection  . ESOPHAGOGASTRODUODENOSCOPY  12/29/2010   Rourk-Retained food in the esophagus and stomach, small hiatal hernia, status post Maloney dilation of the esophagus  . ESOPHAGOGASTRODUODENOSCOPY N/A 09/25/2012   DPO:EUMPNTIR atonic baggy esophagus status post Maloney dilation 34 F. Hiatal hernia  . GIVENS CAPSULE STUDY N/A 01/15/2013   NORMAL.   . IR GENERIC HISTORICAL  03/17/2016   IR RADIOLOGIST EVAL & MGMT 03/17/2016 [facility]-INTERV RAD  . LESION REMOVAL N/A 05/31/2015   Procedure: REMOVAL RIGHT AND LEFT LESIONS OF MANDIBLE;  Surgeon: Diona Browner, DDS;  Location: [place];  Service: Oral Surgery;  Laterality: N/A;  . MALONEY DILATION  12/29/2010   RMR;  . NM MYOCAR PERF WALL MOTION  2006   "relavtiely normal" persantine, mild anterior thinning (breast attenuation artifact), no region of scar/ischemia  . OVARIAN CYST REMOVAL    .  RECTOCELE REPAIR N/A 06/29/2015   Procedure: POSTERIOR REPAIR (RECTOCELE);  Surgeon: Jonnie Kind, MD;  Location: [facility] ORS;  Service: Gynecology;  Laterality: N/A;  . REDUCTION MAMMAPLASTY Bilateral   . SPINE SURGERY  09/29/2010   Dr. Rolena Infante  . surgical excision of 3 tumors from right thigh and right buttock  and left upper thigh  2010  . TOOTH EXTRACTION Bilateral 12/14/2014   Procedure: REMOVAL OF BILATERAL MANDIBULAR EXOSTOSES;  Surgeon: Diona Browner, DDS;  Location: [place];  Service: Oral Surgery;  Laterality: Bilateral;  . TRANSTHORACIC ECHOCARDIOGRAM  2010   EF 60-65%, mild conc LVH, grade 1 diastolic dysfunction; mildly calcified MV annulus with mildly thickened leaflets, mildly calcified MR annulus     OB History    Gravida  6   Para  1   Term  1   Preterm      AB  5   Living        SAB  5   TAB      Ectopic      Multiple      Live Births               Home Medications    Prior to Admission  medications   Medication Sig Start Date End Date Taking? Authorizing Provider  ACCU-CHEK SMARTVIEW test strip TEST BLOOD SUGAR FOUR TIMES DAILY AS DIRECTED 05/08/17   Philemon Kingdom, MD  albuterol (PROVENTIL HFA;VENTOLIN HFA) 108 (90 Base) MCG/ACT inhaler Inhale 1-2 puffs every 6 hours as needed for wheezing, shortness of breath 10/11/17   [name], MD  ascorbic acid (VITAMIN C) 500 MG tablet Take 500 mg by mouth daily.    [provider]  aspirin EC 81 MG tablet Take 81 mg by mouth daily.    [provider]  cetirizine (ZYRTEC) 10 MG tablet Take 1 tablet (10 mg total) by mouth daily. 07/30/17   [name], MD  Cholecalciferol (VITAMIN D3) 5000 units CAPS Take 1 capsule by mouth daily.    [provider]  cloNIDine (CATAPRES) 0.3 MG tablet TAKE 1 TABLET BY MOUTH EVERY EIGHT HOURS. ONCE AT 8AM, 4PM, AND 12 MIDNIGHT. 09/27/17   [name], MD  cloNIDine (CATAPRES) 0.3 MG tablet Take one tablet by mouth three times daily at 8 am , 4 pm and 10 pm 09/30/17   [name], MD  cloNIDine (CATAPRES) 0.3 MG tablet Take 1 tablet (0.3 mg total) by mouth 2 (two) times daily. 10/08/17   [name], MD  diclofenac sodium (VOLTAREN) 1 % GEL Apply 2 g topically daily as needed (pain). Rubs "wherever there is pain" when she doesn't take her pain pill. 08/23/17   [name], MD  dicyclomine (BENTYL) 10 MG capsule Take 1 tab by mouth twice daily as needed for abdominal cramping. 07/06/17   Esterwood, Amy S, PA-C  docusate sodium (COLACE) 100 MG capsule Take 100 mg by mouth 2 (two) times daily.    [provider]  hydroxychloroquine (PLAQUENIL) 200 MG tablet Take 200 mg by mouth daily.     [provider]  insulin aspart (NOVOLOG FLEXPEN) 100 UNIT/ML FlexPen INJECT 10 TO 14 UNITS INTO THE SKIN 3 (THREE) TIMES DAILY WITH MEALS. 06/07/17   Philemon Kingdom, MD  Insulin Glargine (TOUJEO SOLOSTAR) 300 UNIT/ML SOPN Inject 20 Units  into the skin at bedtime. 06/07/17   Philemon Kingdom, MD  lamoTRIgine (LAMICTAL) 100 MG tablet Take 1 tablet (100 mg total) by mouth  2 (two) times daily. 07/26/17   Kathrynn Ducking, MD  levothyroxine (SYNTHROID, LEVOTHROID) 50 MCG tablet TAKE 1 TABLET BY MOUTH ONCE DAILY AND 1/2 TABLET ON SUNDAYS. 10/10/17   [name], MD  lidocaine (XYLOCAINE) 2 % jelly APPLY TO LEFT ABDOMINAL WALL TWICE DAILY. 08/14/17   Esterwood, Amy S, PA-C  LORazepam (ATIVAN) 0.5 MG tablet Take 1 tablet (0.5 mg total) by mouth 3 (three) times daily. 07/30/17 07/30/18  Cloria Spring, MD  losartan (COZAAR) 50 MG tablet Take 1 tablet (50 mg total) by mouth daily. 05/23/17   [name], MD  meclizine (ANTIVERT) 25 MG tablet Take 25 mg by mouth 3 (three) times daily as needed for dizziness. 07/10/17 Has medication and reports not taking often,  but says she takes if dizzy.    [provider]  metoprolol tartrate (LOPRESSOR) 50 MG tablet Take 1 tablet (50 mg total) by mouth 2 (two) times daily. 10/23/17   Hilty, Nadean Corwin, MD  montelukast (SINGULAIR) 10 MG tablet Take 1 tablet (10 mg total) by mouth daily. 10/10/17   [name], MD  Multiple Vitamins-Minerals (HM MULTIVITAMIN ADULT GUMMY PO) Take 1 tablet by mouth daily.    [provider]  nicotine (NICOTROL) 10 MG inhaler Inhale 1 Cartridge (1 continuous puffing total) into the lungs as needed for smoking cessation. 10/15/17   [name], MD  [facility] powder APPLY TO AFFECTED AREA FOUR TIMES DAILY. 08/07/17   [name], MD  nystatin [facility]) powder APPLY TO AFFECTED AREA 4 TIMES DAILY. 10/10/17   [name], MD  oxyCODONE-acetaminophen (PERCOCET/ROXICET) 5-325 MG tablet Take by mouth as needed. 07/25/17   [provider]  polyethylene glycol (MIRALAX) packet Take 17 g by mouth daily. 06/28/17   Evalee Jefferson, PA-C  pregabalin (LYRICA) 75 MG capsule Take 1 capsule (75 mg total) by mouth daily. 10/20/16   Rexene Alberts, MD  RABEprazole (ACIPHEX) 20 MG tablet Take 1 tablet (20 mg total) by mouth 2 (two) times daily. 10/19/17   Esterwood, Amy S, PA-C  RESTASIS 0.05 % ophthalmic emulsion Place 1 drop into both eyes 2 (two) times daily.  02/04/14   [provider]  risperiDONE (RISPERDAL) 0.5 MG tablet Take 1 tablet (0.5 mg total) by mouth at bedtime. 07/30/17   Cloria Spring, MD  rosuvastatin (CRESTOR) 5 MG tablet Take 1 tablet (5 mg total) by mouth at bedtime. 10/10/17   [name], MD  sertraline (ZOLOFT) 100 MG tablet Take 1 tablet (100 mg total) by mouth daily. 07/30/17   Cloria Spring, MD  traZODone (DESYREL) 100 MG tablet Take 1 tablet (100 mg total) by mouth at bedtime. 07/30/17   Cloria Spring, MD  triamcinolone ointment (KENALOG) 0.1 % Apply 1 application topically at bedtime. Apply to elbows at bedtime.    [provider]  UNABLE TO FIND Boost diabetic nutrition drinks dx e11.9   Pullups- use as needed daily dx N39.498 10/08/17   [name], MD  UNABLE TO FIND Lift chair x 1  DX 10/15/17   [name], MD    Family History Family History  Problem Relation Age of Onset  . Heart attack Mother        HTN  . Pneumonia Father   . Kidney failure Father   . Diabetes Father   . Pancreatic cancer Sister   . Diabetes Brother   . Hypertension Brother   . Diabetes Brother   .  Cancer Sister        breast   . Hypertension Son   . Sleep apnea Son   . Cancer Sister        pancreatic  . Stroke Maternal Grandmother   . Heart attack Maternal Grandfather   . Alcohol abuse Maternal Uncle   . Colon cancer Neg Hx   . Anesthesia problems Neg Hx   . Hypotension Neg Hx   . Malignant hyperthermia Neg Hx   . Pseudochol deficiency Neg Hx     Social History Social History   Tobacco Use  . Smoking status: Current Every Day Smoker    Packs/day: 0.25    Years: 7.00    Pack years: 1.75    Types: Cigarettes  . Smokeless tobacco: Never Used  . Tobacco comment:  continues to smoke - a little less than a 1/4 pack per da as of 05/01/2017  Substance Use Topics  . Alcohol use: No    Alcohol/week: 0.0 standard drinks    Comment:    . Drug use: No     Allergies   Cephalexin; Iron; Milk-related compounds; Penicillins; and Phenazopyridine hcl   Review of Systems Review of Systems ROS: Statement: All systems negative except as marked or noted in the HPI; Constitutional: Negative for fever and chills. +generalized weakness.; ; Eyes: Negative for eye pain, redness and discharge. ; ; ENMT: Negative for ear pain, hoarseness, nasal congestion, sinus pressure and sore throat. ; ; Cardiovascular: Negative for chest pain, palpitations, diaphoresis, dyspnea and peripheral edema. ; ; Respiratory: Negative for cough, wheezing and stridor. ; ; Gastrointestinal: Negative for nausea, vomiting, diarrhea, abdominal pain, blood in stool, hematemesis, jaundice and rectal bleeding. . ; ; Genitourinary: Negative for dysuria, flank pain and hematuria. ; ; Musculoskeletal: Negative for back pain and neck pain. Negative for swelling and trauma.; ; Skin: Negative for pruritus, rash, abrasions, blisters, bruising and skin lesion.; ; Neuro: Negative for headache, lightheadedness and neck stiffness. Negative for altered level of consciousness, altered mental status, extremity weakness, paresthesias, involuntary movement, seizure and syncope.       Physical Exam Updated Vital Signs BP (!) 203/79   Pulse 67   Temp 98.4 F (36.9 C) (Oral)   Resp 18   Ht 4\' 11"  (1.499 m)   Wt 71 kg   SpO2 100%   BMI 31.61 kg/m    BP 118/90   Pulse 63   Temp 98.4 F (36.9 C) (Oral)   Resp 17   Ht 4\' 11"  (1.499 m)   Wt 71 kg   SpO2 99%   BMI 31.61 kg/m    07:53 Orthostatic Vital Signs FS  Orthostatic Lying   BP- Lying: 185/70   Pulse- Lying: 68       Orthostatic Sitting  BP- Sitting: 203/82Abnormal    Pulse- Sitting: 70       Orthostatic Standing at 0 minutes  BP- Standing at  0 minutes: (pt unable to stand. )      Physical Exam 0745: Physical examination:  Nursing notes reviewed; Vital signs and O2 SAT reviewed;  Constitutional: Well developed, Well nourished, Well hydrated, In no acute distress; Head:  Normocephalic, atraumatic; Eyes: EOMI, PERRL, No scleral icterus; ENMT: Mouth and pharynx normal, Mucous membranes moist; Neck: Supple, Full range of motion, No lymphadenopathy; Cardiovascular: Regular rate and rhythm, No gallop; Respiratory: Breath sounds clear & equal bilaterally, No wheezes.  Speaking full sentences with ease, Normal respiratory effort/excursion; Chest: Nontender, Movement normal; Abdomen: Soft, Nontender, Nondistended,  Normal bowel sounds; Genitourinary: No CVA tenderness; Spine:  No midline CS, TS, LS tenderness.;;  Extremities: Peripheral pulses normal, No tenderness, No edema, No calf edema or asymmetry.; Neuro: AA&Ox3, Speech slurred per baseline. No facial droop. Grips equal. Strength 4/5 equal bilat UE's and LE's. No apparent gross focal sensory deficits in extremities.; Skin: Color normal, Warm, Dry.   ED Treatments / Results  Labs (all labs ordered are listed, but only abnormal results are displayed)   EKG EKG Interpretation  Date/Time:  Sunday October 28 2017 07:36:56 EDT Ventricular Rate:  71 PR Interval:    QRS Duration: 92 QT Interval:  388 QTC Calculation: 422 R Axis:   42 Text Interpretation:  Sinus rhythm When compared with ECG of 05/19/2017 No significant change was found Confirmed by Francine Graven [id number]) on 10/28/2017 7:53:17 AM   Radiology   Procedures Procedures (including critical care time)  Medications Ordered in ED Medications  cloNIDine (CATAPRES) tablet 0.3 mg (has no administration in time range)     Initial Impression / Assessment and Plan / ED Course  I have reviewed the triage vital signs and the nursing notes.  Pertinent labs & imaging results that were available during my care of the patient  were reviewed by me and considered in my medical decision making (see chart for details).  MDM Reviewed: previous chart, nursing note and vitals Reviewed previous: labs and ECG Interpretation: labs, ECG, x-ray and CT scan    Results for orders placed or performed during the hospital encounter of 81/27/51  Basic metabolic panel  Result Value Ref Range   Sodium 143 135 - 145 mmol/L   Potassium 3.9 3.5 - 5.1 mmol/L   Chloride 107 98 - 111 mmol/L   CO2 26 22 - 32 mmol/L   Glucose, Bld 138 (H) 70 - 99 mg/dL   BUN 21 8 - 23 mg/dL   Creatinine, Ser 1.23 (H) 0.44 - 1.00 mg/dL   Calcium 9.6 8.9 - 10.3 mg/dL   GFR calc non Af Amer 44 (L) >60 mL/min   GFR calc Af Amer 51 (L) >60 mL/min   Anion gap 10 5 - 15  Troponin I  Result Value Ref Range   Troponin I <0.03 <0.03 ng/mL  CBC with Differential  Result Value Ref Range   WBC 6.9 4.0 - 10.5 K/uL   RBC 4.86 3.87 - 5.11 MIL/uL   Hemoglobin 11.3 (L) 12.0 - 15.0 g/dL   HCT 37.7 36.0 - 46.0 %   MCV 77.6 (L) 78.0 - 100.0 fL   MCH 23.3 (L) 26.0 - 34.0 pg   MCHC 30.0 30.0 - 36.0 g/dL   RDW 14.4 11.5 - 15.5 %   Platelets 210 150 - 400 K/uL   Neutrophils Relative % 64 %   Neutro Abs 4.4 1.7 - 7.7 K/uL   Lymphocytes Relative 26 %   Lymphs Abs 1.8 0.7 - 4.0 K/uL   Monocytes Relative 8 %   Monocytes Absolute 0.6 0.1 - 1.0 K/uL   Eosinophils Relative 2 %   Eosinophils Absolute 0.2 0.0 - 0.7 K/uL   Basophils Relative 0 %   Basophils Absolute 0.0 0.0 - 0.1 K/uL  Lactic acid, plasma  Result Value Ref Range   Lactic Acid, Venous 1.3 0.5 - 1.9 mmol/L  Urinalysis, Routine w reflex microscopic  Result Value Ref Range   Color, Urine YELLOW YELLOW   APPearance HAZY (A) CLEAR   Specific Gravity, Urine 1.019 1.005 - 1.030   pH 5.0 5.0 -  8.0   Glucose, UA NEGATIVE NEGATIVE mg/dL   Hgb urine dipstick NEGATIVE NEGATIVE   Bilirubin Urine NEGATIVE NEGATIVE   Ketones, ur NEGATIVE NEGATIVE mg/dL   Protein, ur NEGATIVE NEGATIVE mg/dL   Nitrite  NEGATIVE NEGATIVE   Leukocytes, UA SMALL (A) NEGATIVE   RBC / HPF 11-20 0 - 5 RBC/hpf   WBC, UA 0-5 0 - 5 WBC/hpf   Bacteria, UA RARE (A) NONE SEEN   Squamous Epithelial / LPF 0-5 0 - 5   Mucus PRESENT    Hyaline Casts, UA PRESENT    *Note: Due to a large number of results and/or encounters for the requested time period, some results have not been displayed. A complete set of results can be found in Results Review.   Dg Chest 1 View Result Date: 10/28/2017 CLINICAL DATA:  Altered mental status. EXAM: CHEST  1 VIEW COMPARISON:  05/31/2017 FINDINGS: Patient slightly rotated to the right. Lungs are hypoinflated without focal airspace consolidation or effusion. There is mild prominence of the perihilar markings suggesting mild degree of vascular congestion. Mild stable cardiomegaly. Remainder of the exam is unchanged. IMPRESSION: Suggestion of mild vascular congestion. Electronically Signed   By: Marin Olp M.D.   On: 10/28/2017 09:16   Ct Head Wo Contrast Result Date: 10/28/2017 CLINICAL DATA:  Fall. EXAM: CT HEAD WITHOUT CONTRAST TECHNIQUE: Contiguous axial images were obtained from the base of the skull through the vertex without intravenous contrast. COMPARISON:  MR brain dated October 25, 2017. CT head dated May 19, 2017. FINDINGS: Brain: No evidence of acute infarction, hemorrhage, hydrocephalus, extra-axial collection or mass effect. Unchanged anterior bifrontal encephalomalacia. Known planum sphenoidale meningioma is better evaluated on recent MRI. Vascular: Calcified atherosclerosis at the skullbase. No hyperdense vessel. Skull: Prior bifrontal craniotomy. Negative for fracture or focal lesion. Sinuses/Orbits: No acute finding. Other: None. IMPRESSION: 1.  No acute intracranial abnormality. 2. Unchanged bifrontal encephalomalacia related to prior meningioma resection. Known recurrent planum sphenoidale meningioma is better evaluated on recent MRI. Electronically Signed   By: Titus Dubin  M.D.   On: 10/28/2017 09:18    1015:  BP improved after pt was given her usual home BP med. Pt lives alone, unable to stand. Concern for safety, will admit. T/C returned from [facility] Dr. Carles Collet, case discussed, including:  HPI, pertinent PM/SHx, VS/PE, dx testing, ED course and treatment:  Agreeable to admit.     Final Clinical Impressions(s) / ED Diagnoses   Final diagnoses:  None    ED Discharge Orders    None       Francine Graven, DO 11/01/17 0011

## 2017-10-28 NOTE — H&P (Signed)
History and Physical  [name] IFO:[id number] DOB: May 13, 1950 DOA: 10/28/2017   PCP: [name], MD   Patient coming from: Home  Chief Complaint: leg weakness  HPI:  [name] is a 67 y.o. female with medical history of diabetes mellitus, hypertension, chronic back pain, tobacco use, COPD, migraine headache, hypothyroidism, seizure disorder, poly-pharmacy, bipolar disorder, hyperlipidemia, GERD presenting with bilateral lower extremity weakness that began on the morning of 10/28/2017.  The patient states that she has been in her usual state of health until the morning of 10/28/2017 when she try to get up out of bed.  Apparently, the patient had difficulty standing and fell to the floor.  She did not hit her head.  She denied any syncope.  Patient had denied any fevers, chills, chest pain or shortness breath, cough, hemoptysis, nausea, vomiting, diarrhea, abdominal pain, dysuria, hematuria, worsening headaches.  The patient complains of chronic low back pain which has been unchanged.  She denies any new medications. In the emergency department, the patient was afebrile hemodynamically stable saturating 100% on room air.  BMP, CBC, and urinalysis were unremarkable.  CT of the brain was negative for any acute findings.  Chest x-ray showed possible vascular congestion.  Because of her lower extremity weakness, the patient was made for further evaluation.  Assessment/Plan: Bilateral lower extremity weakness -MRI lumbar and and thoracic spine -Serum M76 -Folic acid -09/29/9468 TSH 0.52 -PT evaluation -CPK  Diabetes mellitus type 2, controlled -10/09/2017 hemoglobin A1c 5.8 -Continue reduced dose Lantus -NovoLog sliding scale  Essential hypertension -Continue home doses of clonidine, metoprolol, losartan  Bipolar disorder -Continue home doses of Lamictal, Risperdal, Zoloft, trazodone  Chronic pain syndrome  -Continue home doses of Ativan, Percocet, Lyrica  COPD -Stable  on room air  Tobacco abuse I have discussed tobacco cessation with the patient.  I have counseled the patient regarding the negative impacts of continued tobacco use including but not limited to lung cancer, COPD, and cardiovascular disease.  I have discussed alternatives to tobacco and modalities that may help facilitate tobacco cessation including but not limited to biofeedback, hypnosis, and medications.  Total time spent with tobacco counseling was 4 minutes.   Hypothyroidism -Continue Synthroid -10/08/17 TSH--0.52       Past Medical History:  Diagnosis Date  . Anemia   . Anxiety    takes Ativan daily  . Arthritis   . Assistance needed for mobility   . Bipolar disorder ([place])    takes Risperdal nightly  . Blood transfusion   . Cancer ([place])    In her gum  . Carpal tunnel syndrome of right wrist 05/23/2011  . Cervical disc disorder with radiculopathy of cervical region 10/31/2012  . Chronic back pain   . Chronic idiopathic constipation   . Chronic neck and back pain   . Colon polyps   . COPD (chronic obstructive pulmonary disease) with chronic bronchitis ([place]) 09/16/2013   Office Spirometry 10/30/2013-submaximal effort based on appearance of loop and curve. Numbers would fit with severe restriction but her physiologic capability may be better than this. FVC 0.91/44%, and 10.74/45%, FEV1/FVC 0.81, FEF 25-75% 1.43/69%    . Depression    takes Zoloft daily  . Diabetes mellitus    Type II  . Diverticulosis    TCS 9/08 by Dr. Delfin Edis for diarrhea . Bx for micro scopic colitis negative.   . Fibromyalgia   . Frequent falls   . GERD (gastroesophageal reflux disease)  takes Aciphex daily  . Glaucoma    eye drops daily  . Gum symptoms    infection on antibiotic  . Hemiplegia affecting non-dominant side, post-stroke 08/02/2011  . Hyperlipidemia    takes Crestor daily  . Hypertension    takes Amlodipine,Metoprolol,and Clonidine daily  . Hypothyroidism    takes Synthroid  daily  . IBS (irritable bowel syndrome)   . Insomnia    takes Trazodone nightly  . 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 Metabolic encephalopathy 10/20/9831  . Migraines    chronic headaches  . Mononeuritis lower limb   . Narcolepsy   . Osteoporosis   . Pancreatitis 2006   due to Depakote with normal EUS   . Schatzki's ring    non critical / EGD with ED 8/2011with RMR  . Seizures ([place])    takes Lamictal daily.Last seizure 3 yrs ago  . Sleep apnea    on CPAP  . Stroke [facility])    left sided weakness, speech changes  . Tubular adenoma of colon    Past Surgical History:  Procedure Laterality Date  . ABDOMINAL HYSTERECTOMY  1978  . BACK SURGERY  July 2012  . BACTERIAL OVERGROWTH TEST N/A 05/05/2013   Procedure: BACTERIAL OVERGROWTH TEST;  Surgeon: Daneil Dolin, MD;  [facility];  Service: Endoscopy;  Laterality: N/A;  7:30  . BIOPSY THYROID  2009  . BRAIN SURGERY  11/2011   resection of meningioma  . BREAST REDUCTION SURGERY  1994  . CARDIAC CATHETERIZATION  05/10/2005   normal coronaries, normal LV systolic function and EF (Dr. Jackie Plum)  . CARPAL TUNNEL RELEASE Left 07/22/04   Dr. Aline Brochure  . CATARACT EXTRACTION Bilateral   . CHOLECYSTECTOMY  1984  . COLONOSCOPY N/A 09/25/2012   ASN:[id number] diverticulosis.  colonic polyp-removed : tubular adenoma  . CRANIOTOMY  11/23/2011   Procedure: CRANIOTOMY TUMOR EXCISION;  Surgeon: Hosie Spangle, MD;  Location: [place] NEURO ORS;  Service: Neurosurgery;  Laterality: N/A;  Craniotomy for tumor resection  . ESOPHAGOGASTRODUODENOSCOPY  12/29/2010   [place]k-Retained food in the esophagus and stomach, small hiatal hernia, status post Maloney dilation of the esophagus  . ESOPHAGOGASTRODUODENOSCOPY N/A 09/25/2012   HAL:[id number] atonic baggy esophagus status post Maloney dilation 31 F. Hiatal hernia  . GIVENS CAPSULE STUDY N/A 01/15/2013   NORMAL.   . IR GENERIC HISTORICAL  03/17/2016   IR RADIOLOGIST EVAL & MGMT 03/17/2016 [facility]-INTERV RAD  .  LESION REMOVAL N/A 05/31/2015   Procedure: REMOVAL RIGHT AND LEFT LESIONS OF MANDIBLE;  Surgeon: Diona Browner, DDS;  Location: [facility];  Service: Oral Surgery;  Laterality: N/A;  . MALONEY DILATION  12/29/2010   RMR;  . NM MYOCAR PERF WALL MOTION  2006   "relavtiely normal" persantine, mild anterior thinning (breast attenuation artifact), no region of scar/ischemia  . OVARIAN CYST REMOVAL    . RECTOCELE REPAIR N/A 06/29/2015   Procedure: POSTERIOR REPAIR (RECTOCELE);  Surgeon: Jonnie Kind, MD;  Location: [facility];  Service: Gynecology;  Laterality: N/A;  . REDUCTION MAMMAPLASTY Bilateral   . SPINE SURGERY  09/29/2010   Dr. Rolena Infante  . surgical excision of 3 tumors from right thigh and right buttock  and left upper thigh  2010  . TOOTH EXTRACTION Bilateral 12/14/2014   Procedure: REMOVAL OF BILATERAL MANDIBULAR EXOSTOSES;  Surgeon: Diona Browner, DDS;  Location: [facility];  Service: Oral Surgery;  Laterality: Bilateral;  . TRANSTHORACIC ECHOCARDIOGRAM  2010   EF 60-65%, mild conc LVH, grade 1 diastolic dysfunction; mildly  calcified MV annulus with mildly thickened leaflets, mildly calcified MR annulus   Social History:  reports that she has been smoking cigarettes. She has a 1.75 pack-year smoking history. She has never used smokeless tobacco. She reports that she does not drink alcohol or use drugs.   Family History  Problem Relation Age of Onset  . Heart attack Mother        HTN  . Pneumonia Father   . Kidney failure Father   . Diabetes Father   . Pancreatic cancer Sister   . Diabetes Brother   . Hypertension Brother   . Diabetes Brother   . Cancer Sister        breast   . Hypertension Son   . Sleep apnea Son   . Cancer Sister        pancreatic  . Stroke Maternal Grandmother   . Heart attack Maternal Grandfather   . Alcohol abuse Maternal Uncle   . Colon cancer Neg Hx   . Anesthesia problems Neg Hx   . Hypotension Neg Hx   . Malignant hyperthermia Neg Hx   . Pseudochol deficiency  Neg Hx      Allergies  Allergen Reactions  . Cephalexin Hives  . Iron Nausea And Vomiting  . Milk-Related Compounds Other (See Comments)    Doesn't agree with stomach.   . Penicillins Hives    Has patient had a PCN reaction causing immediate rash, facial/tongue/throat swelling, SOB or lightheadedness with hypotension: Yes Has patient had a PCN reaction causing severe rash involving mucus membranes or skin necrosis: No Has patient had a PCN reaction that required hospitalization No Has patient had a PCN reaction occurring within the last 10 years: No If all of the above answers are "NO", then may proceed with Cephalosporin use.   [name] Phenazopyridine Hcl Hives           Prior to Admission medications   Medication Sig Start Date End Date Taking? Authorizing Provider  ACCU-CHEK SMARTVIEW test strip TEST BLOOD SUGAR FOUR TIMES DAILY AS DIRECTED 05/08/17   Philemon Kingdom, MD  albuterol (PROVENTIL HFA;VENTOLIN HFA) 108 (90 Base) MCG/ACT inhaler Inhale 1-2 puffs every 6 hours as needed for wheezing, shortness of breath 10/11/17   [name], MD  ascorbic acid (VITAMIN C) 500 MG tablet Take 500 mg by mouth daily.    [provider]  aspirin EC 81 MG tablet Take 81 mg by mouth daily.    [provider]  cetirizine (ZYRTEC) 10 MG tablet Take 1 tablet (10 mg total) by mouth daily. 07/30/17   [name], MD  Cholecalciferol (VITAMIN D3) 5000 units CAPS Take 1 capsule by mouth daily.    [provider]  cloNIDine (CATAPRES) 0.3 MG tablet TAKE 1 TABLET BY MOUTH EVERY EIGHT HOURS. ONCE AT 8AM, 4PM, AND 12 MIDNIGHT. 09/27/17   [name], MD  cloNIDine (CATAPRES) 0.3 MG tablet Take one tablet by mouth three times daily at 8 am , 4 pm and 10 pm 09/30/17   [name], MD  cloNIDine (CATAPRES) 0.3 MG tablet Take 1 tablet (0.3 mg total) by mouth 2 (two) times daily. 10/08/17   [name], MD  diclofenac sodium (VOLTAREN) 1 % GEL Apply 2 g  topically daily as needed (pain). Rubs "wherever there is pain" when she doesn't take her pain pill. 08/23/17   [name], MD  dicyclomine (BENTYL) 10 MG capsule Take 1 tab by mouth twice daily as needed  for abdominal cramping. 07/06/17   Esterwood, Amy S, PA-C  docusate sodium (COLACE) 100 MG capsule Take 100 mg by mouth 2 (two) times daily.    [provider]  hydroxychloroquine (PLAQUENIL) 200 MG tablet Take 200 mg by mouth daily.     [provider]  insulin aspart (NOVOLOG FLEXPEN) 100 UNIT/ML FlexPen INJECT 10 TO 14 UNITS INTO THE SKIN 3 (THREE) TIMES DAILY WITH MEALS. 06/07/17   Philemon Kingdom, MD  Insulin Glargine (TOUJEO SOLOSTAR) 300 UNIT/ML SOPN Inject 20 Units into the skin at bedtime. 06/07/17   Philemon Kingdom, MD  lamoTRIgine (LAMICTAL) 100 MG tablet Take 1 tablet (100 mg total) by mouth 2 (two) times daily. 07/26/17   Kathrynn Ducking, MD  levothyroxine (SYNTHROID, LEVOTHROID) 50 MCG tablet TAKE 1 TABLET BY MOUTH ONCE DAILY AND 1/2 TABLET ON SUNDAYS. 10/10/17   [name], MD  lidocaine (XYLOCAINE) 2 % jelly APPLY TO LEFT ABDOMINAL WALL TWICE DAILY. 08/14/17   Esterwood, Amy S, PA-C  LORazepam (ATIVAN) 0.5 MG tablet Take 1 tablet (0.5 mg total) by mouth 3 (three) times daily. 07/30/17 07/30/18  Cloria Spring, MD  losartan (COZAAR) 50 MG tablet Take 1 tablet (50 mg total) by mouth daily. 05/23/17   [name], MD  meclizine (ANTIVERT) 25 MG tablet Take 25 mg by mouth 3 (three) times daily as needed for dizziness. 07/10/17 Has medication and reports not taking often,  but says she takes if dizzy.    [provider]  metoprolol tartrate (LOPRESSOR) 50 MG tablet Take 1 tablet (50 mg total) by mouth 2 (two) times daily. 10/23/17   Hilty, Nadean Corwin, MD  montelukast (SINGULAIR) 10 MG tablet Take 1 tablet (10 mg total) by mouth daily. 10/10/17   [name], MD  Multiple Vitamins-Minerals (HM MULTIVITAMIN ADULT GUMMY PO) Take 1 tablet by  mouth daily.    [provider]  nicotine (NICOTROL) 10 MG inhaler Inhale 1 Cartridge (1 continuous puffing total) into the lungs as needed for smoking cessation. 10/15/17   [name], MD  [facility] powder APPLY TO AFFECTED AREA FOUR TIMES DAILY. 08/07/17   [name], MD  nystatin [facility]) powder APPLY TO AFFECTED AREA 4 TIMES DAILY. 10/10/17   [name], MD  oxyCODONE-acetaminophen (PERCOCET/ROXICET) 5-325 MG tablet Take by mouth as needed. 07/25/17   [provider]  polyethylene glycol (MIRALAX) packet Take 17 g by mouth daily. 06/28/17   Evalee Jefferson, PA-C  pregabalin (LYRICA) 75 MG capsule Take 1 capsule (75 mg total) by mouth daily. 10/20/16   Rexene Alberts, MD  RABEprazole (ACIPHEX) 20 MG tablet Take 1 tablet (20 mg total) by mouth 2 (two) times daily. 10/19/17   Esterwood, Amy S, PA-C  RESTASIS 0.05 % ophthalmic emulsion Place 1 drop into both eyes 2 (two) times daily.  02/04/14   [provider]  risperiDONE (RISPERDAL) 0.5 MG tablet Take 1 tablet (0.5 mg total) by mouth at bedtime. 07/30/17   Cloria Spring, MD  rosuvastatin (CRESTOR) 5 MG tablet Take 1 tablet (5 mg total) by mouth at bedtime. 10/10/17   [name], MD  sertraline (ZOLOFT) 100 MG tablet Take 1 tablet (100 mg total) by mouth daily. 07/30/17   Cloria Spring, MD  traZODone (DESYREL) 100 MG tablet Take 1 tablet (100 mg total) by mouth at bedtime. 07/30/17   Cloria Spring, MD  triamcinolone ointment (KENALOG) 0.1 % Apply 1 application topically at bedtime. Apply to elbows  at bedtime.    [provider]  UNABLE TO FIND Boost diabetic nutrition drinks dx e11.9   Pullups- use as needed daily dx N39.498 10/08/17   [name], MD  UNABLE TO FIND Lift chair x 1  DX 10/15/17   [name], MD    Review of Systems:  Constitutional:  No weight loss, night sweats, Fevers, chills, fatigue.  Head&Eyes: No headache.  No vision loss.  No eye pain or  scotoma ENT:  No Difficulty swallowing,Tooth/dental problems,Sore throat,  No ear ache, post nasal drip,  Cardio-vascular:  No chest pain, Orthopnea, PND, swelling in lower extremities,  dizziness, palpitations  GI:  No  abdominal pain, nausea, vomiting, diarrhea, loss of appetite, hematochezia, melena, heartburn, indigestion, Resp:  No shortness of breath with exertion or at rest. No cough. No coughing up of blood .No wheezing.No chest wall deformity  Skin:  no rash or lesions.  GU:  no dysuria, change in color of urine, no urgency or frequency. No flank pain.  Musculoskeletal:  No joint pain or swelling. No decreased range of motion. No back pain. New bilateral leg weakness No change in mood or affect. No depression or anxiety. Neurologic: No headache, no dysesthesia, no focal weakness, no vision loss. No syncope  Physical Exam: Vitals:   10/28/17 0800 10/28/17 0900 10/28/17 0930 10/28/17 1000  BP: (!) 203/79 (!) 165/106 140/64 (!) 150/110  Pulse: 67 72 63   Resp: 18 18 (!) 23 20  Temp:      TempSrc:      SpO2: 100% 100% 99%   Weight:      Height:       General:  A&O x 3, NAD, nontoxic, pleasant/cooperative Head/Eye: No conjunctival hemorrhage, no icterus, [place]/AT, No nystagmus ENT:  No icterus,  No thrush, good dentition, no pharyngeal exudate Neck:  No masses, no lymphadenpathy, no bruits CV:  RRR, no rub, no gallop, no S3 Lung:  CTAB, good air movement, no wheeze, no rhonchi Abdomen: soft/NT, +BS, nondistended, no peritoneal signs Ext: No cyanosis, No rashes, No petechiae, No lymphangitis, No edema Neuro: CNII-XII intact, strength 4/5 in bilateral upper and lower extremities, no dysmetria  Labs on Admission:  Basic Metabolic Panel: Recent Labs  Lab 10/28/17 0821  NA 143  K 3.9  CL 107  CO2 26  GLUCOSE 138*  BUN 21  CREATININE 1.23*  CALCIUM 9.6   Liver Function Tests: No results for input(s): AST, ALT, ALKPHOS, BILITOT, PROT, ALBUMIN in the last 168  hours. No results for input(s): LIPASE, AMYLASE in the last 168 hours. No results for input(s): AMMONIA in the last 168 hours. CBC: Recent Labs  Lab 10/28/17 0821  WBC 6.9  NEUTROABS 4.4  HGB 11.3*  HCT 37.7  MCV 77.6*  PLT 210   Coagulation Profile: No results for input(s): INR, PROTIME in the last 168 hours. Cardiac Enzymes: Recent Labs  Lab 10/28/17 0821  TROPONINI <0.03   BNP: Invalid input(s): POCBNP CBG: No results for input(s): GLUCAP in the last 168 hours. Urine analysis:    Component Value Date/Time   COLORURINE YELLOW 10/28/2017 0853   APPEARANCEUR HAZY (A) 10/28/2017 0853   LABSPEC 1.019 10/28/2017 0853   PHURINE 5.0 10/28/2017 0853   GLUCOSEU NEGATIVE 10/28/2017 0853   HGBUR NEGATIVE 10/28/2017 0853   HGBUR negative 03/29/2010 0834   BILIRUBINUR NEGATIVE 10/28/2017 0853   BILIRUBINUR negative 10/08/2017 [place] 10/28/2017 0853   PROTEINUR NEGATIVE 10/28/2017 0853   UROBILINOGEN  0.2 10/08/2017 1143   UROBILINOGEN 0.2 06/02/2014 0140   NITRITE NEGATIVE 10/28/2017 0853   LEUKOCYTESUR SMALL (A) 10/28/2017 0853   Sepsis Labs: @LABRCNTIP (procalcitonin:4,lacticidven:4) )No results found for this or any previous visit (from the past 240 hour(s)).   Radiological Exams on Admission: Dg Chest 1 View  Result Date: 10/28/2017 CLINICAL DATA:  Altered mental status. EXAM: CHEST  1 VIEW COMPARISON:  05/31/2017 FINDINGS: Patient slightly rotated to the right. Lungs are hypoinflated without focal airspace consolidation or effusion. There is mild prominence of the perihilar markings suggesting mild degree of vascular congestion. Mild stable cardiomegaly. Remainder of the exam is unchanged. IMPRESSION: Suggestion of mild vascular congestion. Electronically Signed   By: Marin Olp M.D.   On: 10/28/2017 09:16   Ct Head Wo Contrast  Result Date: 10/28/2017 CLINICAL DATA:  Fall. EXAM: CT HEAD WITHOUT CONTRAST TECHNIQUE: Contiguous axial images were  obtained from the base of the skull through the vertex without intravenous contrast. COMPARISON:  MR brain dated October 25, 2017. CT head dated May 19, 2017. FINDINGS: Brain: No evidence of acute infarction, hemorrhage, hydrocephalus, extra-axial collection or mass effect. Unchanged anterior bifrontal encephalomalacia. Known planum sphenoidale meningioma is better evaluated on recent MRI. Vascular: Calcified atherosclerosis at the skullbase. No hyperdense vessel. Skull: Prior bifrontal craniotomy. Negative for fracture or focal lesion. Sinuses/Orbits: No acute finding. Other: None. IMPRESSION: 1.  No acute intracranial abnormality. 2. Unchanged bifrontal encephalomalacia related to prior meningioma resection. Known recurrent planum sphenoidale meningioma is better evaluated on recent MRI. Electronically Signed   By: Titus Dubin M.D.   On: 10/28/2017 09:18    EKG: Independently reviewed. Sinus, nonspecific T wave changes    Time spent:60 minutes Code Status:   FULL Family Communication:  No Family at bedside Disposition Plan: expect 1-2 day hospitalization Consults called: none DVT Prophylaxis: [place] Lovenox  Orson Eva, DO  [facility] Pager [phone]  If 7PM-7AM, please contact night-coverage [email or web address] Password [facility] 10/28/2017, 10:35 AM

## 2017-10-28 NOTE — ED Triage Notes (Signed)
Pt lives at home alone and had a fall this morning with no complaints.  EMS states they attempted to ambulate pt, but was very spastic and unable to walk with her walker.

## 2017-10-28 NOTE — Plan of Care (Signed)
Pt acclimated to floor and unit orientation completed. All safety precautions maintained. All questions answered. Nurse Lauren aware and assumes care of patient.

## 2017-10-29 ENCOUNTER — Observation Stay (HOSPITAL_COMMUNITY): Payer: Medicare HMO

## 2017-10-29 ENCOUNTER — Telehealth: Payer: Self-pay | Admitting: Family Medicine

## 2017-10-29 DIAGNOSIS — G40909 Epilepsy, unspecified, not intractable, without status epilepticus: Secondary | ICD-10-CM | POA: Diagnosis not present

## 2017-10-29 DIAGNOSIS — M5126 Other intervertebral disc displacement, lumbar region: Secondary | ICD-10-CM | POA: Diagnosis not present

## 2017-10-29 DIAGNOSIS — M5442 Lumbago with sciatica, left side: Secondary | ICD-10-CM

## 2017-10-29 DIAGNOSIS — R531 Weakness: Secondary | ICD-10-CM | POA: Diagnosis not present

## 2017-10-29 DIAGNOSIS — R29898 Other symptoms and signs involving the musculoskeletal system: Secondary | ICD-10-CM | POA: Diagnosis not present

## 2017-10-29 DIAGNOSIS — M5134 Other intervertebral disc degeneration, thoracic region: Secondary | ICD-10-CM | POA: Diagnosis not present

## 2017-10-29 DIAGNOSIS — M47816 Spondylosis without myelopathy or radiculopathy, lumbar region: Secondary | ICD-10-CM | POA: Diagnosis not present

## 2017-10-29 DIAGNOSIS — Z79899 Other long term (current) drug therapy: Secondary | ICD-10-CM | POA: Diagnosis not present

## 2017-10-29 DIAGNOSIS — G43009 Migraine without aura, not intractable, without status migrainosus: Secondary | ICD-10-CM | POA: Diagnosis not present

## 2017-10-29 DIAGNOSIS — Z720 Tobacco use: Secondary | ICD-10-CM | POA: Diagnosis not present

## 2017-10-29 DIAGNOSIS — J449 Chronic obstructive pulmonary disease, unspecified: Secondary | ICD-10-CM | POA: Diagnosis not present

## 2017-10-29 DIAGNOSIS — E1165 Type 2 diabetes mellitus with hyperglycemia: Secondary | ICD-10-CM | POA: Diagnosis not present

## 2017-10-29 LAB — URINE CULTURE: CULTURE: NO GROWTH

## 2017-10-29 LAB — CBC
HEMATOCRIT: 30.5 % — AB (ref 36.0–46.0)
HEMOGLOBIN: 8.8 g/dL — AB (ref 12.0–15.0)
MCH: 22.8 pg — ABNORMAL LOW (ref 26.0–34.0)
MCHC: 28.9 g/dL — AB (ref 30.0–36.0)
MCV: 79 fL (ref 78.0–100.0)
Platelets: 181 10*3/uL (ref 150–400)
RBC: 3.86 MIL/uL — AB (ref 3.87–5.11)
RDW: 14.7 % (ref 11.5–15.5)
WBC: 5.8 10*3/uL (ref 4.0–10.5)

## 2017-10-29 LAB — COMPREHENSIVE METABOLIC PANEL
ALK PHOS: 72 U/L (ref 38–126)
ALT: 19 U/L (ref 0–44)
ANION GAP: 10 (ref 5–15)
AST: 18 U/L (ref 15–41)
Albumin: 3.1 g/dL — ABNORMAL LOW (ref 3.5–5.0)
BUN: 26 mg/dL — ABNORMAL HIGH (ref 8–23)
CALCIUM: 8.6 mg/dL — AB (ref 8.9–10.3)
CO2: 26 mmol/L (ref 22–32)
Chloride: 110 mmol/L (ref 98–111)
Creatinine, Ser: 1.01 mg/dL — ABNORMAL HIGH (ref 0.44–1.00)
GFR calc non Af Amer: 56 mL/min — ABNORMAL LOW (ref >60)
GLUCOSE: 131 mg/dL — AB (ref 70–99)
POTASSIUM: 4 mmol/L (ref 3.5–5.1)
SODIUM: 146 mmol/L — AB (ref 135–145)
Total Bilirubin: 0.5 mg/dL (ref 0.3–1.2)
Total Protein: 5.6 g/dL — ABNORMAL LOW (ref 6.5–8.1)

## 2017-10-29 LAB — GLUCOSE, CAPILLARY
GLUCOSE-CAPILLARY: 141 mg/dL — AB (ref 70–99)
GLUCOSE-CAPILLARY: 142 mg/dL — AB (ref 70–99)
Glucose-Capillary: 126 mg/dL — ABNORMAL HIGH (ref 70–99)
Glucose-Capillary: 107 mg/dL — ABNORMAL HIGH (ref 70–99)

## 2017-10-29 LAB — FOLATE: Folate: 39.3 ng/mL (ref >5.9)

## 2017-10-29 MED ORDER — INSULIN ASPART 100 UNIT/ML ~~LOC~~ SOLN
0.0000 [IU] | Freq: Three times a day (TID) | SUBCUTANEOUS | Status: DC
  Administered 2017-10-29 – 2017-10-30 (×3): 1 [IU] via SUBCUTANEOUS

## 2017-10-29 MED ORDER — CLONIDINE HCL 0.1 MG PO TABS
0.1000 mg | ORAL_TABLET | Freq: Two times a day (BID) | ORAL | Status: DC
  Administered 2017-10-30: 0.1 mg via ORAL
  Filled 2017-10-29: qty 1

## 2017-10-29 MED ORDER — HYDRALAZINE HCL 20 MG/ML IJ SOLN
10.0000 mg | Freq: Once | INTRAMUSCULAR | Status: AC
  Administered 2017-10-29: 10 mg via INTRAVENOUS
  Filled 2017-10-29: qty 1

## 2017-10-29 MED ORDER — CLONIDINE HCL 0.2 MG PO TABS
0.2000 mg | ORAL_TABLET | Freq: Two times a day (BID) | ORAL | Status: AC
  Administered 2017-10-29 (×2): 0.2 mg via ORAL
  Filled 2017-10-29 (×2): qty 1

## 2017-10-29 MED ORDER — INSULIN ASPART 100 UNIT/ML ~~LOC~~ SOLN: 0.0000 [IU] | Freq: Every day | SUBCUTANEOUS | Status: DC

## 2017-10-29 NOTE — Telephone Encounter (Signed)
They will be made aware that she is admitted

## 2017-10-29 NOTE — Progress Notes (Addendum)
   10/29/17 0040 10/29/17 0045  Vitals  BP (!) 88/41 (!) 96/45 (Recheck)  MAP (mmHg)  --  (!) 61  BP Location Left Arm Left Arm  BP Method Automatic Automatic  Patient Position (if appropriate) Lying Lying  Pulse Rate 64 66  Pulse Rate Source Dinamap Dinamap  Patient sleeping, easily arousable. Metoprolol and Clonidine given at HS as ordered. Manuella Ghazi, MD notified.

## 2017-10-29 NOTE — Progress Notes (Addendum)
PROGRESS NOTE  [name] IOX:[id number] DOB: [birth year] DOA: 10/28/2017 PCP: [name], MD  Brief History:   67 y.o. female with medical history of diabetes mellitus, hypertension, chronic back pain, tobacco use, COPD, migraine headache, hypothyroidism, seizure disorder, poly-pharmacy, bipolar disorder, hyperlipidemia, GERD presenting with bilateral lower extremity weakness that began on the morning of 10/28/2017.  The patient states that she has been in her usual state of health until the morning of 10/28/2017 when she try to get up out of bed.  Apparently, the patient had difficulty standing and fell to the floor.  She did not hit her head.  She denied any syncope.  Patient had denied any fevers, chills, chest pain or shortness breath, cough, hemoptysis, nausea, vomiting, diarrhea, abdominal pain, dysuria, hematuria, worsening headaches.  The patient complains of chronic low back pain which has been unchanged.  She denies any new medications.  In the ED, the patient had difficulty standing and bearing weight on her legs.  As a result, the patient was admitted for further workup.  Assessment/Plan: Bilateral lower extremity weakness -MRI lumbar and and thoracic spine -Serum Q68--3419 -Folic acid -09/01/2977 TSH 0.52 -PT evaluation -CPK--210  Diabetes mellitus type 2, controlled -10/09/2017 hemoglobin A1c 5.8 -NovoLog sliding scale -holding lantus and monitor CBGs  Essential hypertension -Continue home doses of clonidine, metoprolol, losartan -I personally obtained vitals at 0645 on 10/29/17--98.4--HR 64--RR16--146/51--100%RA  Bipolar disorder -Continue home doses of Lamictal, Risperdal, Zoloft, trazodone  Chronic pain syndrome  -Continue home doses of Ativan, Percocet, Lyrica The [place] Controlled Substance Reporting System has been queried for this patient-->no red flags -sees Dr. Nelva Bush for chronic pain management  COPD -Stable on room  air  Tobacco abuse I have discussed tobacco cessation with the patient.  I have counseled the patient regarding the negative impacts of continued tobacco use including but not limited to lung cancer, COPD, and cardiovascular disease.  I have discussed alternatives to tobacco and modalities that may help facilitate tobacco cessation including but not limited to biofeedback, hypnosis, and medications.  Total time spent with tobacco counseling was 4 minutes.  Hypothyroidism -Continue Synthroid -10/08/17 TSH--0.52   Disposition Plan:   SNF pending MRI results Family Communication:   No Family at bedside--Total time spent 35 minutes.  Greater than 50% spent face to face counseling and coordinating care.   Consultants:  Pending MR results  Code Status:  FULL   DVT Prophylaxis:  [place] Lovenox   Procedures: As Listed in Progress Note Above  Antibiotics: None    Subjective: Pt complains of low and mid back pain, slight worsen overnight.  Feels legs are about same as yesterday but has not gotten OOB.  Patient denies fevers, chills, headache, chest pain, dyspnea, nausea, vomiting, diarrhea, abdominal pain, dysuria, hematuria, hematochezia, and melena.   Objective: Vitals:   10/28/17 2148 10/29/17 0040 10/29/17 0045 10/29/17 0604  BP: (!) 107/48 (!) 88/41 (!) 96/45 (!) 141/68  Pulse: 72 64 66 62  Resp: 18   18  Temp: 98.9 F (37.2 C)   98.5 F (36.9 C)  TempSrc: Oral   Oral  SpO2: 99%   100%  Weight:      Height:        Intake/Output Summary (Last 24 hours) at 10/29/2017 0658 Last data filed at 10/29/2017 0615 Gross per 24 hour  Intake 1796.25 ml  Output 100 ml  Net 1696.25 ml   Weight change:  Exam:  General:  Pt is alert, follows commands appropriately, not in acute distress  HEENT: No icterus, No thrush, No neck mass, [place]/AT  Cardiovascular: RRR, S1/S2, no rubs, no gallops  Respiratory: bibasilar rales, no wheeze  Abdomen: Soft/+BS, non tender, non distended, no  guarding  Extremities: No edema, No lymphangitis, No petechiae, No rashes, no synovitis  Neuro:  CN II-XII intact, strength 4/5 in RUE, RLE, strength 4-/5 LLE; sensation intact bilateral; no dysmetria; babinski equivocal;  +straight leg raise test on left leg     Data Reviewed: I have personally reviewed following labs and imaging studies Basic Metabolic Panel: Recent Labs  Lab 10/28/17 0821  NA 143  K 3.9  CL 107  CO2 26  GLUCOSE 138*  BUN 21  CREATININE 1.23*  CALCIUM 9.6   Liver Function Tests: No results for input(s): AST, ALT, ALKPHOS, BILITOT, PROT, ALBUMIN in the last 168 hours. No results for input(s): LIPASE, AMYLASE in the last 168 hours. No results for input(s): AMMONIA in the last 168 hours. Coagulation Profile: No results for input(s): INR, PROTIME in the last 168 hours. CBC: Recent Labs  Lab 10/28/17 0821  WBC 6.9  NEUTROABS 4.4  HGB 11.3*  HCT 37.7  MCV 77.6*  PLT 210   Cardiac Enzymes: Recent Labs  Lab 10/28/17 0821 10/28/17 0853  CKTOTAL  --  210  TROPONINI <0.03  --    BNP: Invalid input(s): POCBNP CBG: Recent Labs  Lab 10/28/17 1211 10/28/17 2318  GLUCAP 125* 129*   HbA1C: No results for input(s): HGBA1C in the last 72 hours. Urine analysis:    Component Value Date/Time   COLORURINE YELLOW 10/28/2017 0853   APPEARANCEUR HAZY (A) 10/28/2017 0853   LABSPEC 1.019 10/28/2017 0853   PHURINE 5.0 10/28/2017 0853   GLUCOSEU NEGATIVE 10/28/2017 0853   HGBUR NEGATIVE 10/28/2017 0853   HGBUR negative 03/29/2010 0834   BILIRUBINUR NEGATIVE 10/28/2017 0853   BILIRUBINUR negative 10/08/2017 1143   KETONESUR NEGATIVE 10/28/2017 0853   PROTEINUR NEGATIVE 10/28/2017 0853   UROBILINOGEN 0.2 10/08/2017 1143   UROBILINOGEN 0.2 06/02/2014 0140   NITRITE NEGATIVE 10/28/2017 0853   LEUKOCYTESUR SMALL (A) 10/28/2017 0853   Sepsis Labs: @LABRCNTIP (procalcitonin:4,lacticidven:4) )No results found for this or any previous visit (from the past  240 hour(s)).   Scheduled Meds: . aspirin EC  81 mg Oral Daily  . cholecalciferol  5,000 Units Oral Daily  . cloNIDine  0.3 mg Oral BID  . cycloSPORINE  1 drop Both Eyes BID  . enoxaparin (LOVENOX) injection  40 mg Subcutaneous Q24H  . hydroxychloroquine  200 mg Oral Daily  . lamoTRIgine  100 mg Oral BID  . levothyroxine  50 mcg Oral QAC breakfast  . LORazepam  0.5 mg Oral TID  . losartan  50 mg Oral Daily  . metoprolol tartrate  50 mg Oral BID  . montelukast  10 mg Oral Daily  . nicotine  14 mg Transdermal Daily  . pantoprazole  40 mg Oral Daily  . polyethylene glycol  17 g Oral Daily  . pregabalin  75 mg Oral Daily  . risperiDONE  0.5 mg Oral QHS  . rosuvastatin  5 mg Oral QHS  . sertraline  100 mg Oral Daily   Continuous Infusions: . sodium chloride 75 mL/hr at 10/29/17 0550    Procedures/Studies: Dg Chest 1 View  Result Date: 10/28/2017 CLINICAL DATA:  Altered mental status. EXAM: CHEST  1 VIEW COMPARISON:  05/31/2017 FINDINGS: Patient slightly rotated to the right. Lungs are hypoinflated  without focal airspace consolidation or effusion. There is mild prominence of the perihilar markings suggesting mild degree of vascular congestion. Mild stable cardiomegaly. Remainder of the exam is unchanged. IMPRESSION: Suggestion of mild vascular congestion. Electronically Signed   By: Marin Olp M.D.   On: 10/28/2017 09:16   Ct Head Wo Contrast  Result Date: 10/28/2017 CLINICAL DATA:  Fall. EXAM: CT HEAD WITHOUT CONTRAST TECHNIQUE: Contiguous axial images were obtained from the base of the skull through the vertex without intravenous contrast. COMPARISON:  MR brain dated October 25, 2017. CT head dated May 19, 2017. FINDINGS: Brain: No evidence of acute infarction, hemorrhage, hydrocephalus, extra-axial collection or mass effect. Unchanged anterior bifrontal encephalomalacia. Known planum sphenoidale meningioma is better evaluated on recent MRI. Vascular: Calcified atherosclerosis at the  skullbase. No hyperdense vessel. Skull: Prior bifrontal craniotomy. Negative for fracture or focal lesion. Sinuses/Orbits: No acute finding. Other: None. IMPRESSION: 1.  No acute intracranial abnormality. 2. Unchanged bifrontal encephalomalacia related to prior meningioma resection. Known recurrent planum sphenoidale meningioma is better evaluated on recent MRI. Electronically Signed   By: Titus Dubin M.D.   On: 10/28/2017 09:18    Orson Eva, DO  [facility] Pager [phone]  If 7PM-7AM, please contact night-coverage [email or web address] Password [email or web address] 10/29/2017, 6:58 AM   LOS: 0 days

## 2017-10-29 NOTE — Plan of Care (Signed)
  Problem: Acute Rehab PT Goals(only PT should resolve) Goal: Pt Will Transfer Bed To Chair/Chair To Bed Outcome: Progressing Flowsheets (Taken 10/29/2017 1609) Pt will Transfer Bed to Chair/Chair to Bed: with supervision   Problem: Acute Rehab PT Goals(only PT should resolve) Goal: Pt Will Perform Standing Balance Or Pre-Gait Outcome: Progressing Flowsheets (Taken 10/29/2017 1609) Pt will perform standing balance or pre-gait : with Supervision   Problem: Acute Rehab PT Goals(only PT should resolve) Goal: Pt Will Ambulate Outcome: Progressing Flowsheets (Taken 10/29/2017 1609) Pt will Ambulate: with supervision; 50 feet   4:09 PM, 10/29/17 Lonell Grandchild, MPT Physical Therapist with [facility] 336 [phone] office [phone] mobile phone

## 2017-10-29 NOTE — Telephone Encounter (Signed)
Patient called caus she fell at home , she is in the hospital. She is looking for the number to [facility]. Please advise.

## 2017-10-29 NOTE — Discharge Summary (Addendum)
Physician Discharge Summary  [name] DJS:[id number] DOB: [birth year] DOA: 10/28/2017  PCP: [name], MD  Admit date: 10/28/2017 Discharge date: 10/30/2017  Admitted From: Home Disposition:  Home   Recommendations for Outpatient Follow-up:  1. Follow up with PCP in 1-2 weeks 2. Please obtain BMP/CBC in one week   Home Health:YES Equipment/Devices:HHPT  Discharge Condition: Stable CODE STATUS:FULL Diet recommendation: Heart Healthy   Brief/Interim Summary: 67 y.o.femalewith medical history ofdiabetes mellitus, hypertension, chronic back pain, tobacco use, COPD, migraine headache, hypothyroidism, seizure disorder, poly-pharmacy, bipolar disorder, hyperlipidemia, GERD presenting with bilateral lower extremity weakness that began on the morning of 10/28/2017. The patient states that she has been in her usual state of health until the morning of 10/28/2017 when she try to get up out of bed. Apparently, the patient had difficulty standing and fell to the floor. She did not hit her head. She denied any syncope. Patient had denied any fevers, chills, chest pain or shortness breath, cough, hemoptysis, nausea, vomiting, diarrhea, abdominal pain, dysuria, hematuria, worsening headaches. The patient complains of chronic low back pain which has been unchanged. She denies any new medications.  In the ED, the patient had difficulty standing and bearing weight on her legs.  As a result, the patient was admitted for further workup.  Discharge Diagnoses:  Bilateral lower extremity weakness -MRI lumbar--superior right foraminal narrowing L4-5; mild to moderate left foraminal narrowing L4-5; no changes in the severe bilateral foraminal narrowing L5-S1; status post L3-4 decompression with mild to moderate foraminal narrowing bilateral -MR thoracic spine--no appreciable abnormal thoracic cord signal.  DJD of the thoracic spine without disc protrusion. -Serum Y85--0277 -Folic  AJOI--78.6 -7/67/2094 TSH 0.52 -PT evaluation-->Patient able to ambulate in hallway with occasional bumping into walls, no loss of balance and tolerated sitting up in chair after therapy  -CPK--210 -set up HHPT  -case discussed with Lucienne Minks with Dr. Truddie Crumble to be chronic changes without acute surgical issue -pt has appointment with neurosurgery, Dr. Ileene Rubens on 11/01/17 which I encouraged her to keep  Diabetes mellitus type 2, controlled -10/09/2017 hemoglobin A1c 5.8 -NovoLog sliding scale -holding lantus and monitor CBGs -restart home dose toujeo after discharge  Essential hypertension -Continue home doses of clonidine, metoprolol, losartan -wean clonidine off due to propensity for rebound HTN when pt misses doses -start amlodipine in lieu of clonidine -HR 68--BP 167/67 at time of d/c  Bipolar disorder -Continue home doses of Lamictal, Risperdal, Zoloft, trazodone  Chronic pain syndrome -Continue home doses of Ativan, Percocet, Lyrica The [place] Controlled Substance Reporting System has been queried for this patient-->no red flags -sees Dr. Nelva Bush for chronic pain management  COPD -Stable on room air  Tobacco abuse I have discussed tobacco cessation with the patient. I have counseled the patient regarding the negative impacts of continued tobacco use including but not limited to lung cancer, COPD, and cardiovascular disease. I have discussed alternatives to tobacco and modalities that may help facilitate tobacco cessation including but not limited to biofeedback, hypnosis, and medications. Total time spent with tobacco counseling was 4 minutes.  Hypothyroidism -Continue Synthroid -10/08/17 TSH--0.52   Discharge Instructions   Allergies as of 10/30/2017      Reactions   Cephalexin Hives   Iron Nausea And Vomiting   Milk-related Compounds Other (See Comments)   Doesn't agree with stomach.    Penicillins Hives   Has patient had a PCN  reaction causing immediate rash, facial/tongue/throat swelling, SOB or lightheadedness with hypotension: Yes Has patient had a  PCN reaction causing severe rash involving mucus membranes or skin necrosis: No Has patient had a PCN reaction that required hospitalization No Has patient had a PCN reaction occurring within the last 10 years: No If all of the above answers are "NO", then may proceed with Cephalosporin use.   Phenazopyridine Hcl Hives           Medication List    STOP taking these medications   cloNIDine 0.3 MG tablet Commonly known as:  CATAPRES     TAKE these medications   ACCU-CHEK SMARTVIEW test strip Generic drug:  glucose blood TEST BLOOD SUGAR FOUR TIMES DAILY AS DIRECTED   albuterol 108 (90 Base) MCG/ACT inhaler Commonly known as:  PROVENTIL HFA;VENTOLIN HFA Inhale 1-2 puffs every 6 hours as needed for wheezing, shortness of breath   amLODipine 5 MG tablet Commonly known as:  NORVASC Take 1 tablet (5 mg total) by mouth daily.   ascorbic acid 500 MG tablet Commonly known as:  VITAMIN C Take 500 mg by mouth daily.   aspirin EC 81 MG tablet Take 81 mg by mouth daily.   cetirizine 10 MG tablet Commonly known as:  ZYRTEC Take 1 tablet (10 mg total) by mouth daily.   diclofenac sodium 1 % Gel Commonly known as:  VOLTAREN Apply 2 g topically daily as needed (pain). Rubs "wherever there is pain" when she doesn't take her pain pill.   dicyclomine 10 MG capsule Commonly known as:  BENTYL Take 1 tab by mouth twice daily as needed for abdominal cramping.   docusate sodium 100 MG capsule Commonly known as:  COLACE Take 100 mg by mouth 2 (two) times daily.   HM MULTIVITAMIN ADULT GUMMY PO Take 1 tablet by mouth daily.   hydroxychloroquine 200 MG tablet Commonly known as:  PLAQUENIL Take 200 mg by mouth daily.   insulin aspart 100 UNIT/ML FlexPen Commonly known as:  NOVOLOG INJECT 10 TO 14 UNITS INTO THE SKIN 3 (THREE) TIMES DAILY WITH MEALS.   Insulin  Glargine 300 UNIT/ML Sopn Inject 20 Units into the skin at bedtime.   lamoTRIgine 100 MG tablet Commonly known as:  LAMICTAL Take 1 tablet (100 mg total) by mouth 2 (two) times daily.   levothyroxine 50 MCG tablet Commonly known as:  SYNTHROID, LEVOTHROID TAKE 1 TABLET BY MOUTH ONCE DAILY AND 1/2 TABLET ON SUNDAYS.   lidocaine 2 % jelly Commonly known as:  XYLOCAINE APPLY TO LEFT ABDOMINAL WALL TWICE DAILY.   LORazepam 0.5 MG tablet Commonly known as:  ATIVAN Take 1 tablet (0.5 mg total) by mouth 3 (three) times daily.   losartan 50 MG tablet Commonly known as:  COZAAR Take 1 tablet (50 mg total) by mouth daily.   meclizine 25 MG tablet Commonly known as:  ANTIVERT Take 25 mg by mouth 3 (three) times daily as needed for dizziness. 07/10/17 Has medication and reports not taking often,  but says she takes if dizzy.   metoprolol tartrate 50 MG tablet Commonly known as:  LOPRESSOR Take 1 tablet (50 mg total) by mouth 2 (two) times daily.   montelukast 10 MG tablet Commonly known as:  SINGULAIR Take 1 tablet (10 mg total) by mouth daily.   nystatin powder Commonly known as:  MYCOSTATIN/NYSTOP APPLY TO AFFECTED AREA 4 TIMES DAILY.   oxyCODONE-acetaminophen 5-325 MG tablet Commonly known as:  PERCOCET/ROXICET Take 1 tablet by mouth every 6 (six) hours as needed.   polyethylene glycol packet Commonly known as:  MIRALAX / GLYCOLAX Take  17 g by mouth daily.   pregabalin 75 MG capsule Commonly known as:  LYRICA Take 1 capsule (75 mg total) by mouth daily.   RABEprazole 20 MG tablet Commonly known as:  ACIPHEX Take 1 tablet (20 mg total) by mouth 2 (two) times daily.   RESTASIS 0.05 % ophthalmic emulsion Generic drug:  cycloSPORINE Place 1 drop into both eyes 2 (two) times daily.   risperiDONE 0.5 MG tablet Commonly known as:  RISPERDAL Take 1 tablet (0.5 mg total) by mouth at bedtime.   rosuvastatin 5 MG tablet Commonly known as:  CRESTOR Take 1 tablet (5 mg  total) by mouth at bedtime.   sertraline 100 MG tablet Commonly known as:  ZOLOFT Take 1 tablet (100 mg total) by mouth daily.   traZODone 100 MG tablet Commonly known as:  DESYREL Take 1 tablet (100 mg total) by mouth at bedtime.   triamcinolone ointment 0.1 % Commonly known as:  KENALOG Apply 1 application topically at bedtime. Apply to elbows at bedtime.   Vitamin D3 5000 units Caps Take 1 capsule by mouth daily.       Allergies  Allergen Reactions  . Cephalexin Hives  . Iron Nausea And Vomiting  . Milk-Related Compounds Other (See Comments)    Doesn't agree with stomach.   . Penicillins Hives    Has patient had a PCN reaction causing immediate rash, facial/tongue/throat swelling, SOB or lightheadedness with hypotension: Yes Has patient had a PCN reaction causing severe rash involving mucus membranes or skin necrosis: No Has patient had a PCN reaction that required hospitalization No Has patient had a PCN reaction occurring within the last 10 years: No If all of the above answers are "NO", then may proceed with Cephalosporin use.   [name] Phenazopyridine Hcl Hives          Consultations:  Dr. Zachery Dakins   Procedures/Studies: Dg Chest 1 View  Result Date: 10/28/2017 CLINICAL DATA:  Altered mental status. EXAM: CHEST  1 VIEW COMPARISON:  05/31/2017 FINDINGS: Patient slightly rotated to the right. Lungs are hypoinflated without focal airspace consolidation or effusion. There is mild prominence of the perihilar markings suggesting mild degree of vascular congestion. Mild stable cardiomegaly. Remainder of the exam is unchanged. IMPRESSION: Suggestion of mild vascular congestion. Electronically Signed   By: Marin Olp M.D.   On: 10/28/2017 09:16   Ct Head Wo Contrast  Result Date: 10/28/2017 CLINICAL DATA:  Fall. EXAM: CT HEAD WITHOUT CONTRAST TECHNIQUE: Contiguous axial images were obtained from the base of the skull through the vertex without intravenous contrast.  COMPARISON:  MR brain dated October 25, 2017. CT head dated May 19, 2017. FINDINGS: Brain: No evidence of acute infarction, hemorrhage, hydrocephalus, extra-axial collection or mass effect. Unchanged anterior bifrontal encephalomalacia. Known planum sphenoidale meningioma is better evaluated on recent MRI. Vascular: Calcified atherosclerosis at the skullbase. No hyperdense vessel. Skull: Prior bifrontal craniotomy. Negative for fracture or focal lesion. Sinuses/Orbits: No acute finding. Other: None. IMPRESSION: 1.  No acute intracranial abnormality. 2. Unchanged bifrontal encephalomalacia related to prior meningioma resection. Known recurrent planum sphenoidale meningioma is better evaluated on recent MRI. Electronically Signed   By: Titus Dubin M.D.   On: 10/28/2017 09:18   Mr Thoracic Spine Wo Contrast  Result Date: 10/29/2017 CLINICAL DATA:  Chronic mid back pain.  New leg weakness. EXAM: MRI THORACIC SPINE WITHOUT CONTRAST TECHNIQUE: Multiplanar, multisequence MR imaging of the thoracic spine was performed. No intravenous contrast was administered. COMPARISON:  Chest x-rays dated 10/28/2017, 05/19/2017  and chest CT dated 12/16/2012 FINDINGS: Alignment:  Physiologic. Vertebrae: No fracture, evidence of discitis, or bone lesion. Cord:  Normal signal and morphology.  Tip of the conus is at L1-2. Paraspinal and other soft tissues: Subtle soft tissue edema anterior to the right tenth rib in seen on image 1 of series 10. The rib appears normal. This is nonspecific. Disc levels: The discs throughout the thoracic spine are desiccated. There are tiny disc bulges at each level in the thoracic spine with no disc protrusions or focal neural impingement. Slight hypertrophy of the right ligamentum flavum at T10-11 without neural impingement. No appreciable foraminal or spinal stenosis. No significant facet arthritis. IMPRESSION: 1. No appreciable abnormality of the thoracic spinal cord. 2. Diffuse degenerative disc  disease in the thoracic spine without disc protrusion or neural impingement. 3. No fractures or bone destruction. Electronically Signed   By: Lorriane Shire M.D.   On: 10/29/2017 09:44   Mr Lumbar Spine Wo Contrast  Result Date: 10/29/2017 CLINICAL DATA:  Onset of bilateral lower extremity weakness 10/28/2017. The patient suffered a fall due to onset of weakness. History of prior lumbar surgery. EXAM: MRI LUMBAR SPINE WITHOUT CONTRAST TECHNIQUE: Multiplanar, multisequence MR imaging of the lumbar spine was performed. No intravenous contrast was administered. COMPARISON:  CT abdomen and pelvis 06/28/2017. MRI lumbar spine 07/25/2010. FINDINGS: Segmentation:  Standard. Alignment: 0.4 cm facet mediated anterolisthesis L5 on S1 noted. There is also trace anterolisthesis L3 on L4. Otherwise maintained. Vertebrae: No fracture or worrisome lesion. Degenerative endplate signal change N3-9 and L5-S1 is worst at L5-S1. A few small hemangiomas are noted. Conus medullaris and cauda equina: Conus extends to the L1 level. Conus and cauda equina appear normal. Paraspinal and other soft tissues: A small focus of T2 hyperintensity posterior to the inferior vena cava the L2-3 level is likely a benign retroperitoneal cyst and unchanged since the prior exams. Disc levels: L1-2: Mild facet arthropathy and a minimal bulge without stenosis. L2-3: Minimal disc bulge and mild facet degenerative disease without stenosis. L3-4: The patient has undergone posterior decompression since the prior MRI. There is a broad-based disc bulge but the central canal is open. Mild to moderate foraminal narrowing is more notable on the left and unchanged. L4-5: Status post posterior decompression since the prior MRI. The central canal is widely patent. Disc and facet degenerative change cause severe right foraminal narrowing which is worse than on the prior MRI. Mild to moderate left foraminal narrowing also appears worse than on the prior MRI. L5-S1:  Status post posterior decompression. The central canal is open. Disc and anterolisthesis result in moderately severe to severe bilateral foraminal narrowing, worse on the left. Foraminal narrowing is unchanged in appearance. IMPRESSION: Severe right foraminal narrowing at L4-5 due to disc and facet arthropathy has worsened since the prior MRI. Mild to moderate left foraminal narrowing also appears worse. The patient is status post decompression at this level and the central canal is open. No change in moderately severe to severe bilateral foraminal narrowing at L5-S1, worse on the left. The patient has undergone decompression at this level since the prior MRI and the central canal is open. Status post decompression at L3-4. The central canal is open. Mild to moderate bilateral foraminal narrowing at this level is unchanged in appearance. Electronically Signed   By: Inge Rise M.D.   On: 10/29/2017 09:42        Discharge Exam: Vitals:   10/30/17 0657 10/30/17 0822  BP: (!) 179/61 (!) 180/64  Pulse: 68 70  Resp: 20 16  Temp: 98 F (36.7 C)   SpO2: 100% 96%   Vitals:   10/29/17 2322 10/30/17 0034 10/30/17 0657 10/30/17 0822  BP: (!) 191/64 (!) 128/51 (!) 179/61 (!) 180/64  Pulse: 66 67 68 70  Resp:   20 16  Temp:   98 F (36.7 C)   TempSrc:   Oral   SpO2:   100% 96%  Weight:      Height:        General: Pt is alert, awake, not in acute distress Cardiovascular: RRR, S1/S2 +, no rubs, no gallops Respiratory: bibasilar rales, no wheeze Abdominal: Soft, NT, ND, bowel sounds + Extremities: no edema, no cyanosis Neuro:  CN II-XII intact, strength 4/5 in RUE, RLE, strength 4/5 LUE, LLE; sensation intact bilateral; no dysmetria; babinski equivocal    The results of significant diagnostics from this hospitalization (including imaging, microbiology, ancillary and laboratory) are listed below for reference.    Significant Diagnostic Studies: Dg Chest 1 View  Result Date:  10/28/2017 CLINICAL DATA:  Altered mental status. EXAM: CHEST  1 VIEW COMPARISON:  05/31/2017 FINDINGS: Patient slightly rotated to the right. Lungs are hypoinflated without focal airspace consolidation or effusion. There is mild prominence of the perihilar markings suggesting mild degree of vascular congestion. Mild stable cardiomegaly. Remainder of the exam is unchanged. IMPRESSION: Suggestion of mild vascular congestion. Electronically Signed   By: Marin Olp M.D.   On: 10/28/2017 09:16   Ct Head Wo Contrast  Result Date: 10/28/2017 CLINICAL DATA:  Fall. EXAM: CT HEAD WITHOUT CONTRAST TECHNIQUE: Contiguous axial images were obtained from the base of the skull through the vertex without intravenous contrast. COMPARISON:  MR brain dated October 25, 2017. CT head dated May 19, 2017. FINDINGS: Brain: No evidence of acute infarction, hemorrhage, hydrocephalus, extra-axial collection or mass effect. Unchanged anterior bifrontal encephalomalacia. Known planum sphenoidale meningioma is better evaluated on recent MRI. Vascular: Calcified atherosclerosis at the skullbase. No hyperdense vessel. Skull: Prior bifrontal craniotomy. Negative for fracture or focal lesion. Sinuses/Orbits: No acute finding. Other: None. IMPRESSION: 1.  No acute intracranial abnormality. 2. Unchanged bifrontal encephalomalacia related to prior meningioma resection. Known recurrent planum sphenoidale meningioma is better evaluated on recent MRI. Electronically Signed   By: Titus Dubin M.D.   On: 10/28/2017 09:18   Mr Thoracic Spine Wo Contrast  Result Date: 10/29/2017 CLINICAL DATA:  Chronic mid back pain.  New leg weakness. EXAM: MRI THORACIC SPINE WITHOUT CONTRAST TECHNIQUE: Multiplanar, multisequence MR imaging of the thoracic spine was performed. No intravenous contrast was administered. COMPARISON:  Chest x-rays dated 10/28/2017, 05/19/2017 and chest CT dated 12/16/2012 FINDINGS: Alignment:  Physiologic. Vertebrae: No fracture,  evidence of discitis, or bone lesion. Cord:  Normal signal and morphology.  Tip of the conus is at L1-2. Paraspinal and other soft tissues: Subtle soft tissue edema anterior to the right tenth rib in seen on image 1 of series 10. The rib appears normal. This is nonspecific. Disc levels: The discs throughout the thoracic spine are desiccated. There are tiny disc bulges at each level in the thoracic spine with no disc protrusions or focal neural impingement. Slight hypertrophy of the right ligamentum flavum at T10-11 without neural impingement. No appreciable foraminal or spinal stenosis. No significant facet arthritis. IMPRESSION: 1. No appreciable abnormality of the thoracic spinal cord. 2. Diffuse degenerative disc disease in the thoracic spine without disc protrusion or neural impingement. 3. No fractures or bone destruction. Electronically Signed  By: Lorriane Shire M.D.   On: 10/29/2017 09:44   Mr Lumbar Spine Wo Contrast  Result Date: 10/29/2017 CLINICAL DATA:  Onset of bilateral lower extremity weakness 10/28/2017. The patient suffered a fall due to onset of weakness. History of prior lumbar surgery. EXAM: MRI LUMBAR SPINE WITHOUT CONTRAST TECHNIQUE: Multiplanar, multisequence MR imaging of the lumbar spine was performed. No intravenous contrast was administered. COMPARISON:  CT abdomen and pelvis 06/28/2017. MRI lumbar spine 07/25/2010. FINDINGS: Segmentation:  Standard. Alignment: 0.4 cm facet mediated anterolisthesis L5 on S1 noted. There is also trace anterolisthesis L3 on L4. Otherwise maintained. Vertebrae: No fracture or worrisome lesion. Degenerative endplate signal change N2-7 and L5-S1 is worst at L5-S1. A few small hemangiomas are noted. Conus medullaris and cauda equina: Conus extends to the L1 level. Conus and cauda equina appear normal. Paraspinal and other soft tissues: A small focus of T2 hyperintensity posterior to the inferior vena cava the L2-3 level is likely a benign retroperitoneal  cyst and unchanged since the prior exams. Disc levels: L1-2: Mild facet arthropathy and a minimal bulge without stenosis. L2-3: Minimal disc bulge and mild facet degenerative disease without stenosis. L3-4: The patient has undergone posterior decompression since the prior MRI. There is a broad-based disc bulge but the central canal is open. Mild to moderate foraminal narrowing is more notable on the left and unchanged. L4-5: Status post posterior decompression since the prior MRI. The central canal is widely patent. Disc and facet degenerative change cause severe right foraminal narrowing which is worse than on the prior MRI. Mild to moderate left foraminal narrowing also appears worse than on the prior MRI. L5-S1: Status post posterior decompression. The central canal is open. Disc and anterolisthesis result in moderately severe to severe bilateral foraminal narrowing, worse on the left. Foraminal narrowing is unchanged in appearance. IMPRESSION: Severe right foraminal narrowing at L4-5 due to disc and facet arthropathy has worsened since the prior MRI. Mild to moderate left foraminal narrowing also appears worse. The patient is status post decompression at this level and the central canal is open. No change in moderately severe to severe bilateral foraminal narrowing at L5-S1, worse on the left. The patient has undergone decompression at this level since the prior MRI and the central canal is open. Status post decompression at L3-4. The central canal is open. Mild to moderate bilateral foraminal narrowing at this level is unchanged in appearance. Electronically Signed   By: Inge Rise M.D.   On: 10/29/2017 09:42     Microbiology: Recent Results (from the past 240 hour(s))  Urine culture     Status: None   Collection Time: 10/28/17  8:53 AM  Result Value Ref Range Status   Specimen Description   Final    URINE, CLEAN CATCH Performed at [facility], [address], [place], [place] [address]     Special Requests   Final    NONE Performed at [facility], [address] [place], [place] [address]    Culture   Final    NO GROWTH Performed at [facility], [place] [address] [place], [place] [address]    Report Status 10/29/2017 FINAL  Final     Labs: Basic Metabolic Panel: Recent Labs  Lab 10/28/17 0821 10/29/17 0446  NA 143 146*  K 3.9 4.0  CL 107 110  CO2 26 26  GLUCOSE 138* 131*  BUN 21 26*  CREATININE 1.23* 1.01*  CALCIUM 9.6 8.6*   Liver Function Tests: Recent Labs  Lab 10/29/17 0446  AST 18  ALT 19  ALKPHOS 72  BILITOT 0.5  PROT 5.6*  ALBUMIN 3.1*   No results for input(s): LIPASE, AMYLASE in the last 168 hours. No results for input(s): AMMONIA in the last 168 hours. CBC: Recent Labs  Lab 10/28/17 0821 10/29/17 0446  WBC 6.9 5.8  NEUTROABS 4.4  --   HGB 11.3* 8.8*  HCT 37.7 30.5*  MCV 77.6* 79.0  PLT 210 181   Cardiac Enzymes: Recent Labs  Lab 10/28/17 0821 10/28/17 0853  CKTOTAL  --  210  TROPONINI <0.03  --    BNP: Invalid input(s): POCBNP CBG: Recent Labs  Lab 10/29/17 0839 10/29/17 1146 10/29/17 1524 10/29/17 2219 10/30/17 0817  GLUCAP 126* 141* 142* 107* 87    Time coordinating discharge:  36 minutes  Signed:  Orson Eva, DO [facility] Pager: [phone] 10/30/2017, 10:35 AM

## 2017-10-29 NOTE — Care Management Obs Status (Signed)
[facility] NOTIFICATION   Patient Details  Name: [name] MRN: [id number] Date of Birth: [birth year]   Medicare Observation Status Notification Given:  Yes    Mertis Mosher, Chauncey [place], RN 10/29/2017, 10:35 AM

## 2017-10-29 NOTE — Evaluation (Signed)
Physical Therapy Evaluation Patient Details Name: [name] MRN: [id number] DOB: [birth year] Today's Date: 10/29/2017   History of Present Illness  [name] is a 67 y.o. female with medical history of diabetes mellitus, hypertension, chronic back pain, tobacco use, COPD, migraine headache, hypothyroidism, seizure disorder, poly-pharmacy, bipolar disorder, hyperlipidemia, GERD presenting with bilateral lower extremity weakness that began on the morning of 10/28/2017.  The patient states that she has been in her usual state of health until the morning of 10/28/2017 when she try to get up out of bed.  Apparently, the patient had difficulty standing and fell to the floor.  She did not hit her head.  She denied any syncope.  Patient had denied any fevers, chills, chest pain or shortness breath, cough, hemoptysis, nausea, vomiting, diarrhea, abdominal pain, dysuria, hematuria, worsening headaches.  The patient complains of chronic low back pain which has been unchanged.  She denies any new medications.    Clinical Impression  Patient limited for functional mobility as stated below secondary to BLE weakness, fatigue and fair/poor standing balance.  Patient able to ambulate in hallway with occasional bumping into walls, no loss of balance and tolerated sitting up in chair after therapy - RN notified.   Patient will benefit from continued physical therapy in hospital and recommended venue below to increase strength, balance, endurance for safe ADLs and gait.     Follow Up Recommendations Home health PT;Supervision - Intermittent    Equipment Recommendations  None recommended by PT    Recommendations for Other Services       Precautions / Restrictions Precautions Precautions: Fall Restrictions Weight Bearing Restrictions: No      Mobility  Bed Mobility Overal bed mobility: Modified Independent             General bed mobility comments: head of bed slightly raised  Transfers Overall  transfer level: Needs assistance Equipment used: Rolling walker (2 wheeled) Transfers: Sit to/from [facility] Sit to Stand: Min guard Stand pivot transfers: Min guard       General transfer comment: slightly labored slow movement  Ambulation/Gait Ambulation/Gait assistance: Min guard Gait Distance (Feet): 45 Feet Assistive device: Rolling walker (2 wheeled) Gait Pattern/deviations: Decreased step length - right;Decreased step length - left;Decreased stride length Gait velocity: slow   General Gait Details: Patient demonstrates slow slightly labored cadence with frequent bumping in to walls which is baseline per patient, limited secondary to c/o fatigue and takes additional time when making turns  Financial trader Rankin (Stroke Patients Only)       Balance Overall balance assessment: Needs assistance Sitting-balance support: Feet supported;No upper extremity supported Sitting balance-Leahy Scale: Good     Standing balance support: Bilateral upper extremity supported;During functional activity Standing balance-Leahy Scale: Fair                               Pertinent Vitals/Pain Pain Assessment: 0-10 Pain Score: 8  Pain Location: chronic low back pain Pain Descriptors / Indicators: Constant;Discomfort;Aching Pain Intervention(s): Limited activity within patient's tolerance;Monitored during session    [place] expects to be discharged to:: Private residence Living Arrangements: Alone Available Help at Discharge: Personal care attendant Type of Home: [place]: One level Home Equipment: Environmental consultant - 2 wheels;Cane - single point;Wheelchair - manual;Shower seat;Hospital bed  Prior Function Level of Independence: Needs assistance   Gait / Transfers Assistance Needed: Household ambulator with RW or SPC  ADL's / Homemaking Assistance Needed: home aides 6  hours/day x 5 days/week and 2 hours/day on weekends        Hand Dominance   Dominant Hand: Right    Extremity/Trunk Assessment   Upper Extremity Assessment Upper Extremity Assessment: Generalized weakness    Lower Extremity Assessment Lower Extremity Assessment: Generalized weakness    Cervical / Trunk Assessment Cervical / Trunk Assessment: Normal  Communication   Communication: No difficulties  Cognition Arousal/Alertness: Awake/alert Behavior During Therapy: WFL for tasks assessed/performed Overall Cognitive Status: Within Functional Limits for tasks assessed                                        General Comments      Exercises     Assessment/Plan    PT Assessment Patient needs continued PT services  PT Problem List Decreased strength;Decreased activity tolerance;Decreased balance;Decreased mobility       PT Treatment Interventions Gait training;Stair training;Functional mobility training;Therapeutic activities;Therapeutic exercise;Patient/family education    PT Goals (Current goals can be found in the Care Plan section)  Acute Rehab PT Goals Patient Stated Goal: return home  PT Goal Formulation: With patient Time For Goal Achievement: 10/29/17 Potential to Achieve Goals: Good    Frequency Min 3X/week   Barriers to discharge        Co-evaluation               AM-PAC PT "6 Clicks" Daily Activity  Outcome Measure Difficulty turning over in bed (including adjusting bedclothes, sheets and blankets)?: None Difficulty moving from lying on back to sitting on the side of the bed? : None Difficulty sitting down on and standing up from a chair with arms (e.g., wheelchair, bedside commode, etc,.)?: A Little Help needed moving to and from a bed to chair (including a wheelchair)?: A Little Help needed walking in hospital room?: A Little Help needed climbing 3-5 steps with a railing? : A Lot 6 Click Score: 19    End of Session    Activity Tolerance: Patient tolerated treatment well;Patient limited by fatigue Patient left: in chair;with call bell/phone within reach;with chair alarm set Nurse Communication: Mobility status PT Visit Diagnosis: Unsteadiness on feet (R26.81);Other abnormalities of gait and mobility (R26.89);Muscle weakness (generalized) (M62.81)    Time: 2703-5009 PT Time Calculation (min) (ACUTE ONLY): 31 min   Charges:   PT Evaluation $PT Eval Moderate Complexity: 1 Mod PT Treatments $Therapeutic Activity: 23-37 mins        4:08 PM, 10/29/17 Lonell Grandchild, MPT Physical Therapist with [facility] [phone] office [phone] mobile phone

## 2017-10-30 ENCOUNTER — Ambulatory Visit (HOSPITAL_COMMUNITY): Payer: Medicare HMO | Admitting: Psychiatry

## 2017-10-30 ENCOUNTER — Other Ambulatory Visit: Payer: Self-pay

## 2017-10-30 DIAGNOSIS — G40909 Epilepsy, unspecified, not intractable, without status epilepticus: Secondary | ICD-10-CM | POA: Diagnosis not present

## 2017-10-30 DIAGNOSIS — M5442 Lumbago with sciatica, left side: Secondary | ICD-10-CM | POA: Diagnosis not present

## 2017-10-30 DIAGNOSIS — R531 Weakness: Secondary | ICD-10-CM | POA: Diagnosis not present

## 2017-10-30 DIAGNOSIS — J449 Chronic obstructive pulmonary disease, unspecified: Secondary | ICD-10-CM | POA: Diagnosis not present

## 2017-10-30 DIAGNOSIS — E1165 Type 2 diabetes mellitus with hyperglycemia: Secondary | ICD-10-CM | POA: Diagnosis not present

## 2017-10-30 DIAGNOSIS — R29898 Other symptoms and signs involving the musculoskeletal system: Secondary | ICD-10-CM | POA: Diagnosis not present

## 2017-10-30 DIAGNOSIS — G8929 Other chronic pain: Secondary | ICD-10-CM

## 2017-10-30 DIAGNOSIS — G43009 Migraine without aura, not intractable, without status migrainosus: Secondary | ICD-10-CM | POA: Diagnosis not present

## 2017-10-30 DIAGNOSIS — Z79899 Other long term (current) drug therapy: Secondary | ICD-10-CM | POA: Diagnosis not present

## 2017-10-30 DIAGNOSIS — Z720 Tobacco use: Secondary | ICD-10-CM | POA: Diagnosis not present

## 2017-10-30 LAB — GLUCOSE, CAPILLARY
GLUCOSE-CAPILLARY: 87 mg/dL (ref 70–99)
Glucose-Capillary: 122 mg/dL — ABNORMAL HIGH (ref 70–99)

## 2017-10-30 LAB — HIV ANTIBODY (ROUTINE TESTING W REFLEX): HIV SCREEN 4TH GENERATION: NONREACTIVE

## 2017-10-30 MED ORDER — AMLODIPINE BESYLATE 5 MG PO TABS: 5.0000 mg | ORAL_TABLET | Freq: Every day | ORAL | 1 refills | Status: DC

## 2017-10-30 MED ORDER — AMLODIPINE BESYLATE 5 MG PO TABS
5.0000 mg | ORAL_TABLET | Freq: Every day | ORAL | Status: DC
  Administered 2017-10-30: 5 mg via ORAL
  Filled 2017-10-30: qty 1

## 2017-10-30 NOTE — Patient Outreach (Signed)
[facility]) Care Management  10/30/2017  [name] [birth year] [id number]    1st outreach attempt to the patient for assessment. No answer. HIPAA compliant voice mail left with contact information.  Plan: RN Health Coach will send letter. RN Health Coach will make another attempt to the patient within four business days.  [name] RN, BSN, [place] Direct Dial:  [phone]  Fax: [phone]

## 2017-10-30 NOTE — Progress Notes (Signed)
Patients BP was 191/64 at 2330, paged mid-level got order for one time dose Hydralazine 10 mg. Gave dose and BP now 128/51 at 0030. Will continue to monitor.

## 2017-10-30 NOTE — Care Management Note (Signed)
Case Management Note  Patient Details  Name: [name] MRN: [id number] Date of Birth: 11-Oct-1950  Subjective/Objective:     Leg weakness. From home, [facility] with cane. Has RW, WC, hospital bed, life alert and CPAP at home pta. She reports having a aide. Recommended for [name]. She would like [facility].                Action/Plan: Sharyn Lull of [facility] notified and will obtain orders via Epic. DC home today.   Expected Discharge Date:  10/30/17               Expected Discharge Plan:  [place]  In-House Referral:     Discharge planning Services  CM Consult  Post Acute Care Choice:  Home Health Choice offered to:  Patient  DME Arranged:    DME Agency:     HH Arranged:  PT HH Agency:  [facility]  Status of Service:  Completed, signed off  If discussed at [place] of Stay Meetings, dates discussed:    Additional Comments:  Jennessy Sandridge, [place]uncey [place], RN 10/30/2017, 11:24 AM

## 2017-10-30 NOTE — Progress Notes (Signed)
Patient discharged home today per MD orders. Patient vital signs WDL. IV removed and site WDL. Discharge Instructions including follow up appointments, medications, and education reviewed with patient. Patient verbalizes understanding. Patient is transported out via wheelchair.  

## 2017-10-31 ENCOUNTER — Emergency Department (HOSPITAL_COMMUNITY): Payer: Medicare HMO

## 2017-10-31 ENCOUNTER — Other Ambulatory Visit: Payer: Self-pay

## 2017-10-31 ENCOUNTER — Emergency Department (HOSPITAL_COMMUNITY)
Admission: EM | Admit: 2017-10-31 | Discharge: 2017-10-31 | Disposition: A | Discharge status: Home / Self Care | Payer: Medicare HMO | Attending: Emergency Medicine | Admitting: Emergency Medicine

## 2017-10-31 ENCOUNTER — Encounter (HOSPITAL_COMMUNITY): Payer: Self-pay | Admitting: Emergency Medicine

## 2017-10-31 DIAGNOSIS — R262 Difficulty in walking, not elsewhere classified: Secondary | ICD-10-CM | POA: Diagnosis not present

## 2017-10-31 DIAGNOSIS — R0781 Pleurodynia: Secondary | ICD-10-CM | POA: Diagnosis not present

## 2017-10-31 DIAGNOSIS — J449 Chronic obstructive pulmonary disease, unspecified: Secondary | ICD-10-CM | POA: Diagnosis not present

## 2017-10-31 DIAGNOSIS — F17210 Nicotine dependence, cigarettes, uncomplicated: Secondary | ICD-10-CM | POA: Insufficient documentation

## 2017-10-31 DIAGNOSIS — E039 Hypothyroidism, unspecified: Secondary | ICD-10-CM | POA: Insufficient documentation

## 2017-10-31 DIAGNOSIS — I959 Hypotension, unspecified: Secondary | ICD-10-CM | POA: Diagnosis not present

## 2017-10-31 DIAGNOSIS — Z7982 Long term (current) use of aspirin: Secondary | ICD-10-CM | POA: Diagnosis not present

## 2017-10-31 DIAGNOSIS — R531 Weakness: Secondary | ICD-10-CM | POA: Diagnosis not present

## 2017-10-31 DIAGNOSIS — Z794 Long term (current) use of insulin: Secondary | ICD-10-CM | POA: Insufficient documentation

## 2017-10-31 DIAGNOSIS — E1143 Type 2 diabetes mellitus with diabetic autonomic (poly)neuropathy: Secondary | ICD-10-CM | POA: Insufficient documentation

## 2017-10-31 DIAGNOSIS — Z79899 Other long term (current) drug therapy: Secondary | ICD-10-CM | POA: Diagnosis not present

## 2017-10-31 DIAGNOSIS — R4182 Altered mental status, unspecified: Secondary | ICD-10-CM | POA: Diagnosis not present

## 2017-10-31 DIAGNOSIS — R0789 Other chest pain: Secondary | ICD-10-CM | POA: Diagnosis not present

## 2017-10-31 DIAGNOSIS — Z85819 Personal history of malignant neoplasm of unspecified site of lip, oral cavity, and pharynx: Secondary | ICD-10-CM | POA: Diagnosis not present

## 2017-10-31 DIAGNOSIS — E119 Type 2 diabetes mellitus without complications: Secondary | ICD-10-CM | POA: Insufficient documentation

## 2017-10-31 DIAGNOSIS — S299XXA Unspecified injury of thorax, initial encounter: Secondary | ICD-10-CM | POA: Diagnosis not present

## 2017-10-31 DIAGNOSIS — R42 Dizziness and giddiness: Secondary | ICD-10-CM | POA: Diagnosis not present

## 2017-10-31 LAB — COMPREHENSIVE METABOLIC PANEL
ALT: 24 U/L (ref 0–44)
AST: 25 U/L (ref 15–41)
Albumin: 4 g/dL (ref 3.5–5.0)
Alkaline Phosphatase: 86 U/L (ref 38–126)
Anion gap: 12 (ref 5–15)
BUN: 23 mg/dL (ref 8–23)
CHLORIDE: 104 mmol/L (ref 98–111)
CO2: 25 mmol/L (ref 22–32)
CREATININE: 1.55 mg/dL — AB (ref 0.44–1.00)
Calcium: 9.5 mg/dL (ref 8.9–10.3)
GFR calc Af Amer: 39 mL/min — ABNORMAL LOW (ref >60)
GFR calc non Af Amer: 34 mL/min — ABNORMAL LOW (ref >60)
GLUCOSE: 166 mg/dL — AB (ref 70–99)
Potassium: 3.9 mmol/L (ref 3.5–5.1)
SODIUM: 141 mmol/L (ref 135–145)
Total Bilirubin: 0.5 mg/dL (ref 0.3–1.2)
Total Protein: 7 g/dL (ref 6.5–8.1)

## 2017-10-31 LAB — CBC
HCT: 33.6 % — ABNORMAL LOW (ref 36.0–46.0)
Hemoglobin: 10.1 g/dL — ABNORMAL LOW (ref 12.0–15.0)
MCH: 23.1 pg — AB (ref 26.0–34.0)
MCHC: 30.1 g/dL (ref 30.0–36.0)
MCV: 76.9 fL — AB (ref 78.0–100.0)
PLATELETS: 217 10*3/uL (ref 150–400)
RBC: 4.37 MIL/uL (ref 3.87–5.11)
RDW: 14.1 % (ref 11.5–15.5)
WBC: 5.8 10*3/uL (ref 4.0–10.5)

## 2017-10-31 LAB — CBG MONITORING, ED: Glucose-Capillary: 157 mg/dL — ABNORMAL HIGH (ref 70–99)

## 2017-10-31 NOTE — ED Notes (Signed)
Pt returned from radiology at this time.  

## 2017-10-31 NOTE — Progress Notes (Signed)
Smoking Cessation referral made to [place] [facility] on 10/25/17.

## 2017-10-31 NOTE — ED Provider Notes (Addendum)
[facility] EMERGENCY DEPARTMENT Provider Note   CSN: [id number] Arrival date & time: 10/31/17  7517     History   Chief Complaint Chief Complaint  Patient presents with  . Altered Mental Status    HPI [name] is a 67 y.o. female.  Patient states that she slid out of bed and had to crawl to the door to open it.  She called EMS.  Patient very somnolent but would awake and open her eyes and talk.  Patient states she took her medications as she supposed to and did not take too many.  Patient brought in by EMS.  Patient seen for a similar scenario [name] regarding a fall on August 18.  Patient's only other complaint was some soreness to her left anterior chest.  States that she did not fall this time just slid out of bed.     Past Medical History:  Diagnosis Date  . Anemia   . Anxiety    takes Ativan daily  . Arthritis   . Assistance needed for mobility   . Bipolar disorder ([place])    takes Risperdal nightly  . Blood transfusion   . Cancer ([place])    In her gum  . Carpal tunnel syndrome of right wrist 05/23/2011  . Cervical disc disorder with radiculopathy of cervical region 10/31/2012  . Chronic back pain   . Chronic idiopathic constipation   . Chronic neck and back pain   . Colon polyps   . COPD (chronic obstructive pulmonary disease) with chronic bronchitis ([place]) 09/16/2013   Office Spirometry 10/30/2013-submaximal effort based on appearance of loop and curve. Numbers would fit with severe restriction but her physiologic capability may be better than this. FVC 0.91/44%, and 10.74/45%, FEV1/FVC 0.81, FEF 25-75% 1.43/69%    . Depression    takes Zoloft daily  . Diabetes mellitus    Type II  . Diverticulosis    TCS 9/08 by Dr. Delfin Edis for diarrhea . Bx for micro scopic colitis negative.   . Fibromyalgia   . Frequent falls   . GERD (gastroesophageal reflux disease)    takes Aciphex daily  . Glaucoma    eye drops daily  . Gum symptoms    infection on antibiotic  .  Hemiplegia affecting non-dominant side, post-stroke 08/02/2011  . Hyperlipidemia    takes Crestor daily  . Hypertension    takes Amlodipine,Metoprolol,and Clonidine daily  . Hypothyroidism    takes Synthroid daily  . IBS (irritable bowel syndrome)   . Insomnia    takes Trazodone nightly  . 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 Metabolic encephalopathy 0/03/7492  . Migraines    chronic headaches  . Mononeuritis lower limb   . Narcolepsy   . Osteoporosis   . Pancreatitis 2006   due to Depakote with normal EUS   . Schatzki's ring    non critical / EGD with ED 8/2011with RMR  . Seizures ([name])    takes Lamictal daily.Last seizure 3 yrs ago  . Sleep apnea    on CPAP  . Stroke [facility])    left sided weakness, speech changes  . Tubular adenoma of colon     Patient Active Problem List   Diagnosis Date Noted  . Weakness generalized   . Leg weakness, bilateral 10/28/2017  . Lumbar spondylosis with myelopathy 10/12/2017  . At risk for cardiovascular event 06/10/2017  . Rash and nonspecific skin eruption 04/25/2017  . Hypotension   . Diabetic polyneuropathy associated with type 2 diabetes mellitus (  [name]) 05/19/2016  . Tobacco user 04/24/2016  . History of palpitations 08/09/2015  . Nausea without vomiting 08/09/2015  . Labile hypertension 08/03/2015  . Normal coronary arteries 08/03/2015  . Left-sided low back pain with left-sided sciatica 06/27/2015  . Type 2 diabetes mellitus with hyperglycemia, with long-term current use of insulin ([place]) 05/06/2015  . Multinodular goiter 05/06/2015  . Rectocele, female 04/27/2015  . Anal sphincter incontinence 04/27/2015  . Pelvic relaxation due to rectocele 03/30/2015  . Pulmonary hypertension ([name]) 02/22/2015  . Light cigarette smoker (1-9 cigarettes per day) 01/11/2015  . Migraine without aura and without status migrainosus, not intractable 07/02/2014  . Flatulence 02/18/2014  . Microcytic anemia 02/18/2014  . COPD (chronic obstructive  pulmonary disease) with chronic bronchitis ([place]) 09/16/2013  . Hypothyroidism 08/16/2013  . Gastroparesis 04/28/2013  . Seizure disorder ([place]) 01/19/2013  . Cervical disc disorder with radiculopathy of cervical region 10/31/2012  . Solitary pulmonary nodule 08/19/2012  . Anemia 07/05/2012  . 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 Allergic sinusitis 04/18/2012  . Meningioma ([name]) 11/19/2011  . 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 Carpal tunnel syndrome of right wrist 05/23/2011  . Polypharmacy 04/28/2011  . Bipolar disorder ([name]) 04/28/2011  . Constipation 04/13/2011  . Falls frequently 12/12/2010  . Oropharyngeal dysphagia 07/12/2010  . Urinary incontinence 12/16/2009  . HEARING LOSS 10/26/2009  . Hyperlipidemia 12/11/2008  . IBS 12/11/2008  . GERD 07/29/2008  . MILK PRODUCTS ALLERGY 07/29/2008  . 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 Backache 06/19/2007  . Osteoporosis 06/19/2007  . Obstructive sleep apnea 06/19/2007  . TRIGGER FINGER 04/18/2007  . DIVERTICULOSIS, COLON 11/13/2006    Past Surgical History:  Procedure Laterality Date  . ABDOMINAL HYSTERECTOMY  1978  . BACK SURGERY  July 2012  . BACTERIAL OVERGROWTH TEST N/A 05/05/2013   Procedure: BACTERIAL OVERGROWTH TEST;  Surgeon: Daneil Dolin, MD;  [facility];  Service: Endoscopy;  Laterality: N/A;  7:30  . BIOPSY THYROID  2009  . BRAIN SURGERY  11/2011   resection of meningioma  . BREAST REDUCTION SURGERY  1994  . CARDIAC CATHETERIZATION  05/10/2005   normal coronaries, normal LV systolic function and EF (Dr. Jackie Plum)  . CARPAL TUNNEL RELEASE Left 07/22/04   Dr. Aline Brochure  . CATARACT EXTRACTION Bilateral   . CHOLECYSTECTOMY  1984  . COLONOSCOPY N/A 09/25/2012   YHC:WCBJSEG diverticulosis.  colonic polyp-removed : tubular adenoma  . CRANIOTOMY  11/23/2011   Procedure: CRANIOTOMY TUMOR EXCISION;  Surgeon: Hosie Spangle, MD;  Location: [place]  NEURO ORS;  Service: Neurosurgery;  Laterality: N/A;  Craniotomy for tumor resection  . ESOPHAGOGASTRODUODENOSCOPY  12/29/2010   Rourk-Retained food in the esophagus and stomach, small hiatal hernia, status post Maloney dilation of the esophagus  . ESOPHAGOGASTRODUODENOSCOPY N/A 09/25/2012   BTD:VVOHYWVP atonic baggy esophagus status post Maloney dilation 55 F. Hiatal hernia  . GIVENS CAPSULE STUDY N/A 01/15/2013   NORMAL.   . IR GENERIC HISTORICAL  03/17/2016   IR RADIOLOGIST EVAL & MGMT 03/17/2016 [facility]-INTERV RAD  . LESION REMOVAL N/A 05/31/2015   Procedure: REMOVAL RIGHT AND LEFT LESIONS OF MANDIBLE;  Surgeon: Diona Browner, DDS;  Location: [place];  Service: Oral Surgery;  Laterality: N/A;  . MALONEY DILATION  12/29/2010   RMR;  . NM MYOCAR PERF WALL MOTION  2006   "relavtiely normal" persantine, mild anterior thinning (breast attenuation artifact), no region of scar/ischemia  . OVARIAN CYST REMOVAL    .  RECTOCELE REPAIR N/A 06/29/2015   Procedure: POSTERIOR REPAIR (RECTOCELE);  Surgeon: Jonnie Kind, MD;  Location: [facility];  Service: Gynecology;  Laterality: N/A;  . REDUCTION MAMMAPLASTY Bilateral   . SPINE SURGERY  09/29/2010   Dr. Rolena Infante  . surgical excision of 3 tumors from right thigh and right buttock  and left upper thigh  2010  . TOOTH EXTRACTION Bilateral 12/14/2014   Procedure: REMOVAL OF BILATERAL MANDIBULAR EXOSTOSES;  Surgeon: Diona Browner, DDS;  Location: [place];  Service: Oral Surgery;  Laterality: Bilateral;  . TRANSTHORACIC ECHOCARDIOGRAM  2010   EF 60-65%, mild conc LVH, grade 1 diastolic dysfunction; mildly calcified MV annulus with mildly thickened leaflets, mildly calcified MR annulus     OB History    Gravida  6   Para  1   Term  1   Preterm      AB  5   Living        SAB  5   TAB      Ectopic      Multiple      Live Births               Home Medications    Prior to Admission medications   Medication Sig Start Date End Date Taking? Authorizing  Provider  ACCU-CHEK SMARTVIEW test strip TEST BLOOD SUGAR FOUR TIMES DAILY AS DIRECTED 05/08/17   Philemon Kingdom, MD  albuterol (PROVENTIL HFA;VENTOLIN HFA) 108 (90 Base) MCG/ACT inhaler Inhale 1-2 puffs every 6 hours as needed for wheezing, shortness of breath 10/11/17   [name], MD  amLODipine (NORVASC) 5 MG tablet Take 1 tablet (5 mg total) by mouth daily. 10/30/17   Orson Eva, MD  ascorbic acid (VITAMIN C) 500 MG tablet Take 500 mg by mouth daily.    [provider]  aspirin EC 81 MG tablet Take 81 mg by mouth daily.    [provider]  cetirizine (ZYRTEC) 10 MG tablet Take 1 tablet (10 mg total) by mouth daily. 07/30/17   [name], MD  Cholecalciferol (VITAMIN D3) 5000 units CAPS Take 1 capsule by mouth daily.    [provider]  diclofenac sodium (VOLTAREN) 1 % GEL Apply 2 g topically daily as needed (pain). Rubs "wherever there is pain" when she doesn't take her pain pill. 08/23/17   [name], MD  dicyclomine (BENTYL) 10 MG capsule Take 1 tab by mouth twice daily as needed for abdominal cramping. 07/06/17   Esterwood, Amy S, PA-C  docusate sodium (COLACE) 100 MG capsule Take 100 mg by mouth 2 (two) times daily.    [provider]  hydroxychloroquine (PLAQUENIL) 200 MG tablet Take 200 mg by mouth daily.     [provider]  insulin aspart (NOVOLOG FLEXPEN) 100 UNIT/ML FlexPen INJECT 10 TO 14 UNITS INTO THE SKIN 3 (THREE) TIMES DAILY WITH MEALS. 06/07/17   Philemon Kingdom, MD  Insulin Glargine (TOUJEO SOLOSTAR) 300 UNIT/ML SOPN Inject 20 Units into the skin at bedtime. 06/07/17   Philemon Kingdom, MD  lamoTRIgine (LAMICTAL) 100 MG tablet Take 1 tablet (100 mg total) by mouth 2 (two) times daily. 07/26/17   Kathrynn Ducking, MD  levothyroxine (SYNTHROID, LEVOTHROID) 50 MCG tablet TAKE 1 TABLET BY MOUTH ONCE DAILY AND 1/2 TABLET ON SUNDAYS. 10/10/17   [name], MD  lidocaine (XYLOCAINE) 2 % jelly APPLY TO LEFT  ABDOMINAL WALL TWICE DAILY. 08/14/17   Esterwood, Amy S, PA-C  LORazepam (ATIVAN) 0.5 MG  tablet Take 1 tablet (0.5 mg total) by mouth 3 (three) times daily. 07/30/17 07/30/18  Cloria Spring, MD  losartan (COZAAR) 50 MG tablet Take 1 tablet (50 mg total) by mouth daily. 05/23/17   [name], MD  meclizine (ANTIVERT) 25 MG tablet Take 25 mg by mouth 3 (three) times daily as needed for dizziness. 07/10/17 Has medication and reports not taking often,  but says she takes if dizzy.    [provider]  metoprolol tartrate (LOPRESSOR) 50 MG tablet Take 1 tablet (50 mg total) by mouth 2 (two) times daily. 10/23/17   Hilty, Nadean Corwin, MD  montelukast (SINGULAIR) 10 MG tablet Take 1 tablet (10 mg total) by mouth daily. 10/10/17   [name], MD  Multiple Vitamins-Minerals (HM MULTIVITAMIN ADULT GUMMY PO) Take 1 tablet by mouth daily.    [provider]  nystatin (NYAMYC) powder APPLY TO AFFECTED AREA 4 TIMES DAILY. 10/10/17   [name], MD  oxyCODONE-acetaminophen (PERCOCET/ROXICET) 5-325 MG tablet Take 1 tablet by mouth every 6 (six) hours as needed.  07/25/17   [provider]  polyethylene glycol (MIRALAX) packet Take 17 g by mouth daily. 06/28/17   Evalee Jefferson, PA-C  pregabalin (LYRICA) 75 MG capsule Take 1 capsule (75 mg total) by mouth daily. 10/20/16   Rexene Alberts, MD  RABEprazole (ACIPHEX) 20 MG tablet Take 1 tablet (20 mg total) by mouth 2 (two) times daily. 10/19/17   Esterwood, Amy S, PA-C  RESTASIS 0.05 % ophthalmic emulsion Place 1 drop into both eyes 2 (two) times daily.  02/04/14   [provider]  risperiDONE (RISPERDAL) 0.5 MG tablet Take 1 tablet (0.5 mg total) by mouth at bedtime. 07/30/17   Cloria Spring, MD  rosuvastatin (CRESTOR) 5 MG tablet Take 1 tablet (5 mg total) by mouth at bedtime. 10/10/17   [name], MD  sertraline (ZOLOFT) 100 MG tablet Take 1 tablet (100 mg total) by mouth daily. 07/30/17   Cloria Spring, MD    traZODone (DESYREL) 100 MG tablet Take 1 tablet (100 mg total) by mouth at bedtime. 07/30/17   Cloria Spring, MD  triamcinolone ointment (KENALOG) 0.1 % Apply 1 application topically at bedtime. Apply to elbows at bedtime.    [provider]    Family History Family History  Problem Relation Age of Onset  . Heart attack Mother        HTN  . Pneumonia Father   . Kidney failure Father   . Diabetes Father   . Pancreatic cancer Sister   . Diabetes Brother   . Hypertension Brother   . Diabetes Brother   . Cancer Sister        breast   . Hypertension Son   . Sleep apnea Son   . Cancer Sister        pancreatic  . Stroke Maternal Grandmother   . Heart attack Maternal Grandfather   . Alcohol abuse Maternal Uncle   . Colon cancer Neg Hx   . Anesthesia problems Neg Hx   . Hypotension Neg Hx   . Malignant hyperthermia Neg Hx   . Pseudochol deficiency Neg Hx     Social History Social History   Tobacco Use  . Smoking status: Current Every Day Smoker    Packs/day: 0.25    Years: 7.00    Pack years: 1.75    Types: Cigarettes  . Smokeless tobacco: Never Used  . Tobacco comment: continues to smoke -  a little less than a 1/4 pack per da as of 05/01/2017  Substance Use Topics  . Alcohol use: No    Alcohol/week: 0.0 standard drinks    Comment:    . Drug use: No     Allergies   Cephalexin; Iron; Milk-related compounds; Penicillins; and Phenazopyridine hcl   Review of Systems Review of Systems  Unable to perform ROS: Mental status change     Physical Exam Updated Vital Signs BP (!) 163/59   Pulse 62   Temp 98 F (36.7 C) (Oral)   Resp 19   Wt 71.1 kg   SpO2 100%   BMI 31.67 kg/m   Physical Exam  Constitutional: She appears well-developed and well-nourished. No distress.  HENT:  Head: Normocephalic and atraumatic.  Mouth/Throat: Oropharynx is clear and moist.  Eyes: Pupils are equal, round, and reactive to light. Conjunctivae and EOM are normal.   Neck: Neck supple.  Cardiovascular: Normal rate and regular rhythm.  Pulmonary/Chest: Effort normal and breath sounds normal. She exhibits tenderness.  Abdominal: Soft. Bowel sounds are normal. There is no tenderness.  Musculoskeletal: Normal range of motion. She exhibits no deformity.  Neurological:  Patient very somnolent but arousable and [name] talk.  Denied any specific complaints other than some soreness to the left anterior chest worse with palpation.  Nursing note and vitals reviewed.    ED Treatments / Results  Labs (all labs ordered are listed, but only abnormal results are displayed) Labs Reviewed  COMPREHENSIVE METABOLIC PANEL - Abnormal; Notable for the following components:      Result Value   Glucose, Bld 166 (*)    Creatinine, Ser 1.55 (*)    GFR calc non Af Amer 34 (*)    GFR calc Af Amer 39 (*)    All other components within normal limits  CBC - Abnormal; Notable for the following components:   Hemoglobin 10.1 (*)    HCT 33.6 (*)    MCV 76.9 (*)    MCH 23.1 (*)    All other components within normal limits  CBG MONITORING, ED - Abnormal; Notable for the following components:   Glucose-Capillary 157 (*)    All other components within normal limits    EKG EKG Interpretation  Date/Time:  Wednesday October 31 2017 09:12:54 EDT Ventricular Rate:  58 PR Interval:    QRS Duration: 95 QT Interval:  430 QTC Calculation: 423 R Axis:   51 Text Interpretation:  Sinus rhythm Abnormal R-wave progression, early transition Probable left ventricular hypertrophy No significant change since last tracing Reconfirmed by [name] [phone]) on 10/31/2017 12:41:24 PM   Radiology Dg Ribs Unilateral W/chest Left  Result Date: 10/31/2017 CLINICAL DATA:  Left anterior rib pain status post fall this morning striking the left side. The films were performed with the patient supine on a stretcher. EXAM: LEFT RIBS AND CHEST - 3+ VIEW COMPARISON:  Chest x-ray of October 28, 2017  FINDINGS: The lungs are mildly hypoinflated but clear. The cardiac silhouette is enlarged. The pulmonary vascularity is not clearly engorged. There is calcification in the wall of the aortic arch. There is no pleural effusion or pneumothorax. Left rib detail images reveal no acute displaced fractures. IMPRESSION: Mild hypoinflation. No pneumothorax, pulmonary contusion, or pleural effusion. No definite acute left rib fracture. Thoracic aortic atherosclerosis. Electronically Signed   By: David  Martinique M.D.   On: 10/31/2017 13:21    Procedures Procedures (including critical care time)  Medications Ordered in ED Medications - No data  to display   Initial Impression / Assessment and Plan / ED Course  I have reviewed the triage vital signs and the nursing notes.  Pertinent labs & imaging results that were available during my care of the patient were reviewed by me and considered in my medical decision making (see chart for details).     Patient's lab work-up without any significant findings other than a slight worsening of her renal function.  In addition chest x-ray with left ribs without any bony or pulmonary acute findings.  Patient is now awake and alert and eating.  Patient claims she was just tired.  Patient has a history of anemia.  No significant change.    Patient stable for discharge home.  Final Clinical Impressions(s) / ED Diagnoses   Final diagnoses:  Altered mental status, unspecified altered mental status type  Chest wall pain    ED Discharge Orders    None       [name], MD 10/31/17 1423    [name], MD 10/31/17 1426

## 2017-10-31 NOTE — ED Notes (Signed)
Pt to xray at this time.

## 2017-10-31 NOTE — ED Notes (Signed)
MD notified  

## 2017-10-31 NOTE — Discharge Instructions (Addendum)
Make an appointment to follow-up with your regular doctor.  Return for any new or worse symptoms.

## 2017-10-31 NOTE — ED Triage Notes (Signed)
Pt found on floor, slow to respond.  Pt states she slid out of bed and crawled on the floor.  Pt responds to voice.  Pt states she took all of her meds today but unable to tell me what kind of how much.

## 2017-11-01 ENCOUNTER — Encounter (HOSPITAL_COMMUNITY): Payer: Self-pay | Admitting: Emergency Medicine

## 2017-11-01 ENCOUNTER — Emergency Department (HOSPITAL_COMMUNITY)
Admission: EM | Admit: 2017-11-01 | Discharge: 2017-11-01 | Disposition: A | Discharge status: Home / Self Care | Payer: Medicare HMO | Attending: Emergency Medicine | Admitting: Emergency Medicine

## 2017-11-01 ENCOUNTER — Emergency Department (HOSPITAL_COMMUNITY): Payer: Medicare HMO

## 2017-11-01 ENCOUNTER — Other Ambulatory Visit: Payer: Self-pay

## 2017-11-01 DIAGNOSIS — Z7982 Long term (current) use of aspirin: Secondary | ICD-10-CM | POA: Insufficient documentation

## 2017-11-01 DIAGNOSIS — J449 Chronic obstructive pulmonary disease, unspecified: Secondary | ICD-10-CM | POA: Diagnosis not present

## 2017-11-01 DIAGNOSIS — Z8673 Personal history of transient ischemic attack (TIA), and cerebral infarction without residual deficits: Secondary | ICD-10-CM | POA: Diagnosis not present

## 2017-11-01 DIAGNOSIS — Z79899 Other long term (current) drug therapy: Secondary | ICD-10-CM | POA: Diagnosis not present

## 2017-11-01 DIAGNOSIS — F17210 Nicotine dependence, cigarettes, uncomplicated: Secondary | ICD-10-CM | POA: Insufficient documentation

## 2017-11-01 DIAGNOSIS — Z794 Long term (current) use of insulin: Secondary | ICD-10-CM | POA: Diagnosis not present

## 2017-11-01 DIAGNOSIS — E785 Hyperlipidemia, unspecified: Secondary | ICD-10-CM | POA: Insufficient documentation

## 2017-11-01 DIAGNOSIS — R4020 Unspecified coma: Secondary | ICD-10-CM | POA: Diagnosis not present

## 2017-11-01 DIAGNOSIS — E119 Type 2 diabetes mellitus without complications: Secondary | ICD-10-CM | POA: Insufficient documentation

## 2017-11-01 DIAGNOSIS — I10 Essential (primary) hypertension: Secondary | ICD-10-CM | POA: Insufficient documentation

## 2017-11-01 DIAGNOSIS — R531 Weakness: Secondary | ICD-10-CM

## 2017-11-01 DIAGNOSIS — E860 Dehydration: Secondary | ICD-10-CM | POA: Diagnosis not present

## 2017-11-01 DIAGNOSIS — E039 Hypothyroidism, unspecified: Secondary | ICD-10-CM | POA: Diagnosis not present

## 2017-11-01 DIAGNOSIS — R29898 Other symptoms and signs involving the musculoskeletal system: Secondary | ICD-10-CM | POA: Diagnosis not present

## 2017-11-01 LAB — COMPREHENSIVE METABOLIC PANEL
ALBUMIN: 4.3 g/dL (ref 3.5–5.0)
ALT: 28 U/L (ref 0–44)
AST: 32 U/L (ref 15–41)
Alkaline Phosphatase: 92 U/L (ref 38–126)
Anion gap: 10 (ref 5–15)
BUN: 25 mg/dL — AB (ref 8–23)
CHLORIDE: 105 mmol/L (ref 98–111)
CO2: 25 mmol/L (ref 22–32)
Calcium: 9.5 mg/dL (ref 8.9–10.3)
Creatinine, Ser: 1.14 mg/dL — ABNORMAL HIGH (ref 0.44–1.00)
GFR calc Af Amer: 56 mL/min — ABNORMAL LOW (ref >60)
GFR, EST NON AFRICAN AMERICAN: 49 mL/min — AB (ref >60)
GLUCOSE: 148 mg/dL — AB (ref 70–99)
POTASSIUM: 4.5 mmol/L (ref 3.5–5.1)
Sodium: 140 mmol/L (ref 135–145)
Total Bilirubin: 0.5 mg/dL (ref 0.3–1.2)
Total Protein: 7.5 g/dL (ref 6.5–8.1)

## 2017-11-01 LAB — URINALYSIS, ROUTINE W REFLEX MICROSCOPIC
BILIRUBIN URINE: NEGATIVE
GLUCOSE, UA: NEGATIVE mg/dL
HGB URINE DIPSTICK: NEGATIVE
KETONES UR: NEGATIVE mg/dL
Leukocytes, UA: NEGATIVE
Nitrite: NEGATIVE
PROTEIN: NEGATIVE mg/dL
Specific Gravity, Urine: 1.019 (ref 1.005–1.030)
pH: 5 (ref 5.0–8.0)

## 2017-11-01 LAB — CBC WITH DIFFERENTIAL/PLATELET
BASOS ABS: 0 10*3/uL (ref 0.0–0.1)
Basophils Relative: 0 %
EOS PCT: 1 %
Eosinophils Absolute: 0.1 10*3/uL (ref 0.0–0.7)
HCT: 35.1 % — ABNORMAL LOW (ref 36.0–46.0)
Hemoglobin: 10.7 g/dL — ABNORMAL LOW (ref 12.0–15.0)
Lymphocytes Relative: 17 %
Lymphs Abs: 1.5 10*3/uL (ref 0.7–4.0)
MCH: 23.6 pg — ABNORMAL LOW (ref 26.0–34.0)
MCHC: 30.5 g/dL (ref 30.0–36.0)
MCV: 77.3 fL — ABNORMAL LOW (ref 78.0–100.0)
MONO ABS: 0.5 10*3/uL (ref 0.1–1.0)
Monocytes Relative: 6 %
Neutro Abs: 6.8 10*3/uL (ref 1.7–7.7)
Neutrophils Relative %: 76 %
PLATELETS: 213 10*3/uL (ref 150–400)
RBC: 4.54 MIL/uL (ref 3.87–5.11)
RDW: 14.2 % (ref 11.5–15.5)
WBC: 8.9 10*3/uL (ref 4.0–10.5)

## 2017-11-01 LAB — AMMONIA: AMMONIA: 21 umol/L (ref 9–35)

## 2017-11-01 MED ORDER — LEVOTHYROXINE SODIUM 50 MCG PO TABS
50.0000 ug | ORAL_TABLET | Freq: Every day | ORAL | Status: DC
  Administered 2017-11-01: 50 ug via ORAL
  Filled 2017-11-01: qty 1

## 2017-11-01 MED ORDER — LORAZEPAM 1 MG PO TABS
1.0000 mg | ORAL_TABLET | Freq: Once | ORAL | Status: AC
  Administered 2017-11-01: 1 mg via ORAL
  Filled 2017-11-01: qty 1

## 2017-11-01 MED ORDER — LAMOTRIGINE 25 MG PO TABS: 100.0000 mg | ORAL_TABLET | Freq: Two times a day (BID) | ORAL | Status: DC

## 2017-11-01 MED ORDER — INSULIN ASPART 100 UNIT/ML ~~LOC~~ SOLN: 10.0000 [IU] | Freq: Three times a day (TID) | SUBCUTANEOUS | Status: DC

## 2017-11-01 MED ORDER — DICYCLOMINE HCL 10 MG PO CAPS: 10.0000 mg | ORAL_CAPSULE | Freq: Three times a day (TID) | ORAL | Status: DC | PRN

## 2017-11-01 MED ORDER — PANTOPRAZOLE SODIUM 40 MG PO TBEC
40.0000 mg | DELAYED_RELEASE_TABLET | Freq: Every day | ORAL | Status: DC
  Administered 2017-11-01: 40 mg via ORAL
  Filled 2017-11-01: qty 1

## 2017-11-01 MED ORDER — ASPIRIN EC 81 MG PO TBEC
81.0000 mg | DELAYED_RELEASE_TABLET | Freq: Every day | ORAL | Status: DC
  Administered 2017-11-01: 81 mg via ORAL
  Filled 2017-11-01: qty 1

## 2017-11-01 MED ORDER — CYCLOSPORINE 0.05 % OP EMUL: 1.0000 [drp] | Freq: Two times a day (BID) | OPHTHALMIC | Status: DC

## 2017-11-01 MED ORDER — VITAMIN D3 125 MCG (5000 UT) PO CAPS: 1.0000 | ORAL_CAPSULE | Freq: Every day | ORAL | Status: DC

## 2017-11-01 MED ORDER — METOPROLOL TARTRATE 50 MG PO TABS: 50.0000 mg | ORAL_TABLET | Freq: Two times a day (BID) | ORAL | Status: DC

## 2017-11-01 MED ORDER — HYDROXYCHLOROQUINE SULFATE 200 MG PO TABS: 200.0000 mg | ORAL_TABLET | Freq: Every day | ORAL | Status: DC

## 2017-11-01 MED ORDER — DOCUSATE SODIUM 100 MG PO CAPS: 100.0000 mg | ORAL_CAPSULE | Freq: Two times a day (BID) | ORAL | Status: DC

## 2017-11-01 MED ORDER — LOSARTAN POTASSIUM 25 MG PO TABS
50.0000 mg | ORAL_TABLET | Freq: Every day | ORAL | Status: DC
  Administered 2017-11-01: 50 mg via ORAL
  Filled 2017-11-01: qty 2

## 2017-11-01 MED ORDER — ROSUVASTATIN CALCIUM 5 MG PO TABS: 5.0000 mg | ORAL_TABLET | Freq: Every day | ORAL | Status: DC

## 2017-11-01 MED ORDER — RISPERIDONE 1 MG PO TABS: 0.5000 mg | ORAL_TABLET | Freq: Every day | ORAL | Status: DC

## 2017-11-01 MED ORDER — LORATADINE 10 MG PO TABS
10.0000 mg | ORAL_TABLET | Freq: Every day | ORAL | Status: DC
  Administered 2017-11-01: 10 mg via ORAL
  Filled 2017-11-01: qty 1

## 2017-11-01 MED ORDER — VITAMIN C 500 MG PO TABS: 500.0000 mg | ORAL_TABLET | Freq: Every day | ORAL | Status: DC

## 2017-11-01 MED ORDER — AMLODIPINE BESYLATE 5 MG PO TABS
5.0000 mg | ORAL_TABLET | Freq: Every day | ORAL | Status: DC
  Administered 2017-11-01: 5 mg via ORAL
  Filled 2017-11-01: qty 1

## 2017-11-01 NOTE — ED Notes (Signed)
James from PT/OT in to ambulate pt.  Tolerated well.  Pt is up in chair.  Able to visually see pt from nurses station.  Pt is cooperative and able to ambulate with assistance.

## 2017-11-01 NOTE — ED Notes (Signed)
Spoke with Marcine Matar regarding pt being discharge to home.  Pt was seen by SW and refuses NH placement.

## 2017-11-01 NOTE — ED Notes (Signed)
[name] (niece/POA) called to check on pt.  Her number is [phone].  [name] says she will call Marcine Matar (son/POA) to inform him of pts visit to ED.  Son telephone number is [phone]

## 2017-11-01 NOTE — Care Management (Signed)
CM consult for [facility] needs and living conditions. Pt from home, has aid and [place] services. Pt discharged day before yesterday. Per RN pt needs placement. PT has seen pt in ED. CM discussed with CSW, consult placed.

## 2017-11-01 NOTE — ED Notes (Signed)
Pt continues to attempt get up, asking when is her son coming because she is ready to go to a doctors appointment.  Pt pick in her nose causing small amount of bleeding.  No active bleeding noted at this time.
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
## 2017-11-01 NOTE — ED Notes (Signed)
When walking past pts room. Pt found lying on the ground in no distress. MD Horton notified.

## 2017-11-01 NOTE — ED Notes (Signed)
Pt asleep.

## 2017-11-01 NOTE — ED Triage Notes (Signed)
Pt in ED today. Hit life alert for EMS. Pt confused. Pt back bc she states her weakness is no better.

## 2017-11-01 NOTE — Discharge Instructions (Addendum)
Make sure that you are taking all of your medications as prescribed.  Try to drink plenty of water and eat 3 meals each day.  Be careful when you stand up and sit down if you feel weak or that you might fall.  The home health service will contact you for further care and treatment.  It is important to follow-up with your primary care doctor to discuss your ongoing health.  It may still be of benefit to you to enter into a nursing care facility for rehab or further treatments as needed.

## 2017-11-01 NOTE — Plan of Care (Signed)
  Problem: Acute Rehab PT Goals(only PT should resolve) Goal: Pt Will Go Supine/Side To Sit Outcome: Progressing Flowsheets (Taken 11/01/2017 0930) Pt will go Supine/Side to Sit: with supervision Goal: Patient Will Transfer Sit To/From Stand Outcome: Progressing Flowsheets (Taken 11/01/2017 0930) Patient will transfer sit to/from stand: with min guard assist Goal: Pt Will Transfer Bed To Chair/Chair To Bed Outcome: Progressing Flowsheets (Taken 11/01/2017 0930) Pt will Transfer Bed to Chair/Chair to Bed: min guard assist Goal: Pt Will Ambulate Outcome: Progressing Flowsheets (Taken 11/01/2017 0930) Pt will Ambulate: with min guard assist; 50 feet; with rolling walker   9:31 AM, 11/01/17 Lonell Grandchild, MPT Physical Therapist with [facility] [phone] office [phone] mobile phone

## 2017-11-01 NOTE — ED Provider Notes (Signed)
10:30 PM-patient has been evaluated by physical therapy who recommends placement in a skilled nursing facility, based on need for assistance with daily activities and ambulation, for safety.  Routine medications ordered.  2:45 PM-patient has been evaluated in the ED by social work.  She was offered placement, but refused.  She called her son who came here and was willing to take her home.  I discussed things with the son at the bedside.  He wants the patient to going to rehab but the patient continues to refuse.  Son understands that she has a right to make this decision.  We discussed the follow-up treatment plan.  Patient and son are in agreement.  Home health will come to her home to evaluate her comprehensively and arrange for ongoing management as needed.  Orders entered by me.   Daleen Bo, MD 11/01/17 [phone]

## 2017-11-01 NOTE — Clinical Social Work Note (Signed)
Clinical Social Work Assessment  Patient Details  Name: [name] MRN: [id number] Date of Birth: Dec 15, 1950  Date of referral:  11/01/17               Reason for consult:  Facility Placement, Discharge Planning                Permission sought to share information with:    Permission granted to share information::     Name::        Agency::     Relationship::     Contact Information:     Housing/Transportation Living arrangements for the past 2 months:  Apartment Source of Information:  Patient Patient Interpreter Needed:  None Criminal Activity/Legal Involvement Pertinent to Current Situation/Hospitalization:  No - Comment as needed Significant Relationships:  [place], Industrial/product designer, Other Family Members Lives with:  Self Do you feel safe going back to the place where you live?  Yes Need for family participation in patient care:  Yes (Comment)  Care giving concerns: Pt lives alone, She was confused and falling at home.   Social Worker assessment / plan: Received CSW consult to see patient for SNF rehab placement. Reviewed pt's record and discussed pt with RN CM who worked with pt during her hospitalization earlier this week. At the time of dc this past Tuesday, pt was not confused and PT had recommended HH PT. Pt returned home to her apartment with [facility].  Met with pt in ED room to assess. Pt is alert and oriented x4 during assessment. She remembers being weak and falling at home. She knows that EMS had to break her door to get to her. Discussed SNF rehab recommendation with pt who states that she is not agreeable to SNF rehab and she wants to go home. Patient states she has a cane, walker, wheelchair, hospital bed, and lifeline at home. She states that she has had enough bad experiences with rehab and she doesn't want to do that again. Pt agrees to continue with [facility] services. Patient states that she has a neighbor who can sit with her if she needs supervision and she is waiting on  results of her assessment with [facility] for in-home caregiver assistance.   Discussed with MD who then met with pt along with CSW to discuss recommendations. Pt informed MD of all of the above and continued to state that she wanted to go home. MD agrees to dc her home with [place] follow up for PT, Aide, and LCSW. Notified RN CM of above.  Updated pt's RN who will contact pt's niece for transport. LCSW will be available if needs change.  Employment status:  Retired Nurse, adult PT Recommendations:  [place] / Referral to community resources:     Patient/Family's Response to care: Pt accepting of care.  Patient/Family's Understanding of and Emotional Response to Diagnosis, Current Treatment, and Prognosis: Pt appears to have a basic understanding of diagnosis and treatment recommendations. She is not interested in following the recommendation for SNF rehab. No emotional distress identified.  Emotional Assessment Appearance:  Appears stated age Attitude/Demeanor/Rapport:  Engaged Affect (typically observed):  Pleasant Orientation:  Oriented to Self, Oriented to Place, Oriented to  Time, Oriented to Situation Alcohol / Substance use:  Not Applicable Psych involvement (Current and /or in the community):  No (Comment)  Discharge Needs  Concerns to be addressed:  Home Safety Concerns, Denies Needs/Concerns at this time, Patient refuses services Readmission within the  last 30 days:  Yes Current discharge risk:  Lives alone, Chronically ill, Dependent with Mobility Barriers to Discharge:  No Barriers Identified   Shade Flood, LCSW 11/01/2017, 11:47 AM

## 2017-11-01 NOTE — Evaluation (Signed)
Physical Therapy Evaluation Patient Details Name: [name] MRN: [id number] DOB: 11-23-1950 Today's Date: 11/01/2017   History of Present Illness  [name] is a 67 y/o female with c/o generalized weakness and unable to get out of bed.  PMHx: left-sided weakness from previous stroke    Clinical Impression  Patient demonstrates slow labored unsteady movement for transfers and ambulation, tends to drift to the right with difficulty advancing BLE, presently a fall risk and limited for ambulation mostly due to c/o fatigue.  Patient tolerated sitting up in chair after therapy - RN aware.  Patient will benefit from continued physical therapy in hospital and recommended venue below to increase strength, balance, endurance for safe ADLs and gait.    Follow Up Recommendations SNF;Supervision/Assistance - 24 hour;Supervision for mobility/OOB    Equipment Recommendations       Recommendations for Other Services       Precautions / Restrictions Precautions Precautions: Fall Restrictions Weight Bearing Restrictions: No      Mobility  Bed Mobility Overal bed mobility: Needs Assistance Bed Mobility: Supine to Sit     Supine to sit: Min assist     General bed mobility comments: slow labored movement  Transfers Overall transfer level: Needs assistance Equipment used: Rolling walker (2 wheeled) Transfers: Sit to/from [facility] Sit to Stand: Min assist Stand pivot transfers: Min assist       General transfer comment: labored slow movement  Ambulation/Gait Ambulation/Gait assistance: Min assist Gait Distance (Feet): 20 Feet Assistive device: Rolling walker (2 wheeled) Gait Pattern/deviations: Decreased step length - right;Decreased step length - left;Decreased stride length;Drifts right/left Gait velocity: slow   General Gait Details: slow labored unsteady cadence with difficulty advancing RLE, limited secondary to c/o fatigue  Stairs             Wheelchair Mobility    Modified Rankin (Stroke Patients Only)       Balance Overall balance assessment: Needs assistance Sitting-balance support: No upper extremity supported;Feet supported Sitting balance-Leahy Scale: Fair     Standing balance support: Bilateral upper extremity supported;During functional activity Standing balance-Leahy Scale: Fair Standing balance comment: using RW                             Pertinent Vitals/Pain Pain Assessment: No/denies pain    Home Living Family/patient expects to be discharged to:: Private residence Living Arrangements: Alone Available Help at Discharge: Neighbor;Personal care attendant Type of Home: Apartment Home Access: Level entry     Home Layout: One level Home Equipment: Environmental consultant - 2 wheels      Prior Function Level of Independence: Needs assistance   Gait / Transfers Assistance Needed: Household ambulator with RW  ADL's / Homemaking Assistance Needed: home aides assist        Hand Dominance        Extremity/Trunk Assessment   Upper Extremity Assessment Upper Extremity Assessment: Generalized weakness    Lower Extremity Assessment Lower Extremity Assessment: Generalized weakness    Cervical / Trunk Assessment Cervical / Trunk Assessment: Normal  Communication   Communication: No difficulties  Cognition Arousal/Alertness: Awake/alert Behavior During Therapy: WFL for tasks assessed/performed Overall Cognitive Status: Within Functional Limits for tasks assessed                                        General Comments  Exercises     Assessment/Plan    PT Assessment Patient needs continued PT services  PT Problem List Decreased strength;Decreased activity tolerance;Decreased balance;Decreased mobility       PT Treatment Interventions Gait training;Stair training;Functional mobility training;Therapeutic activities;Therapeutic exercise;Patient/family education     PT Goals (Current goals can be found in the Care Plan section)  Acute Rehab PT Goals Patient Stated Goal: return home PT Goal Formulation: With patient Time For Goal Achievement: 11/15/17    Frequency Min 3X/week   Barriers to discharge        Co-evaluation               AM-PAC PT "6 Clicks" Daily Activity  Outcome Measure Difficulty turning over in bed (including adjusting bedclothes, sheets and blankets)?: None Difficulty moving from lying on back to sitting on the side of the bed? : A Little Difficulty sitting down on and standing up from a chair with arms (e.g., wheelchair, bedside commode, etc,.)?: A Little Help needed moving to and from a bed to chair (including a wheelchair)?: A Little Help needed walking in hospital room?: A Lot Help needed climbing 3-5 steps with a railing? : A Lot 6 Click Score: 17    End of Session Equipment Utilized During Treatment: Gait belt Activity Tolerance: Patient limited by fatigue Patient left: in chair Nurse Communication: Mobility status;Other (comment)(RN aware patient left up in chair) PT Visit Diagnosis: Unsteadiness on feet (R26.81);Other abnormalities of gait and mobility (R26.89);Muscle weakness (generalized) (M62.81)    Time: 0158-6825 PT Time Calculation (min) (ACUTE ONLY): 25 min   Charges:   PT Evaluation $PT Eval Moderate Complexity: 1 Mod PT Treatments $Therapeutic Activity: 23-37 mins        9:29 AM, 11/01/17 Lonell Grandchild, MPT Physical Therapist with [facility] 336 [phone] office [phone] mobile phone

## 2017-11-01 NOTE — Care Management (Signed)
CM contacted [facility], [facility] rep, and [facility] nurse will be able to make [facility] visit tomorrow. Sharyn Lull is aware of new [place] Orders and will pull from chart.

## 2017-11-01 NOTE — ED Notes (Signed)
Pt has attempted several times to ambulate without assistance.  Easily redirected.

## 2017-11-02 ENCOUNTER — Telehealth: Payer: Self-pay

## 2017-11-02 DIAGNOSIS — E119 Type 2 diabetes mellitus without complications: Secondary | ICD-10-CM | POA: Diagnosis not present

## 2017-11-02 DIAGNOSIS — M5442 Lumbago with sciatica, left side: Secondary | ICD-10-CM | POA: Diagnosis not present

## 2017-11-02 DIAGNOSIS — Z794 Long term (current) use of insulin: Secondary | ICD-10-CM | POA: Diagnosis not present

## 2017-11-02 DIAGNOSIS — M5136 Other intervertebral disc degeneration, lumbar region: Secondary | ICD-10-CM | POA: Diagnosis not present

## 2017-11-02 DIAGNOSIS — M542 Cervicalgia: Secondary | ICD-10-CM | POA: Diagnosis not present

## 2017-11-02 DIAGNOSIS — M4716 Other spondylosis with myelopathy, lumbar region: Secondary | ICD-10-CM | POA: Diagnosis not present

## 2017-11-02 DIAGNOSIS — F060 Psychotic disorder with hallucinations due to known physiological condition: Secondary | ICD-10-CM | POA: Diagnosis not present

## 2017-11-02 DIAGNOSIS — R260 Ataxic gait: Secondary | ICD-10-CM | POA: Diagnosis not present

## 2017-11-02 DIAGNOSIS — G8929 Other chronic pain: Secondary | ICD-10-CM | POA: Diagnosis not present

## 2017-11-02 NOTE — Telephone Encounter (Signed)
Transition Care Management Follow-up Telephone Call   Date discharged?     10/31/17           How have you been since you were released from the hospital? sore from where she hit her side. Went back to ED yesterday.   Do you understand why you were in the hospital? Yes. fall   Do you understand the discharge instructions? yes   Where were you discharged to? Home. They wanted me to go to rehab for 2 weeks but I refused.    Items Reviewed:  Medications reviewed: yes  Allergies reviewed: yes  Dietary changes reviewed: yes  Referrals reviewed: yes   Functional Questionnaire:   Activities of Daily Living (ADLs):  needs help    Any transportation issues/concerns?: no   Any patient concerns? patient needs help with ADL's    Confirmed importance and date/time of follow-up visits scheduled ye     Confirmed with patient if condition begins to worsen call PCP or go to the ER.  Patient was given the office number and encouraged to call back with question or concerns.  yes with verbal understanding

## 2017-11-05 ENCOUNTER — Ambulatory Visit: Payer: Self-pay

## 2017-11-05 ENCOUNTER — Telehealth: Payer: Self-pay | Admitting: Family Medicine

## 2017-11-05 ENCOUNTER — Other Ambulatory Visit: Payer: Self-pay

## 2017-11-05 DIAGNOSIS — M542 Cervicalgia: Secondary | ICD-10-CM | POA: Diagnosis not present

## 2017-11-05 DIAGNOSIS — R260 Ataxic gait: Secondary | ICD-10-CM | POA: Diagnosis not present

## 2017-11-05 DIAGNOSIS — M4716 Other spondylosis with myelopathy, lumbar region: Secondary | ICD-10-CM | POA: Diagnosis not present

## 2017-11-05 DIAGNOSIS — F060 Psychotic disorder with hallucinations due to known physiological condition: Secondary | ICD-10-CM | POA: Diagnosis not present

## 2017-11-05 DIAGNOSIS — E119 Type 2 diabetes mellitus without complications: Secondary | ICD-10-CM | POA: Diagnosis not present

## 2017-11-05 DIAGNOSIS — Z794 Long term (current) use of insulin: Secondary | ICD-10-CM | POA: Diagnosis not present

## 2017-11-05 DIAGNOSIS — M5136 Other intervertebral disc degeneration, lumbar region: Secondary | ICD-10-CM | POA: Diagnosis not present

## 2017-11-05 DIAGNOSIS — G8929 Other chronic pain: Secondary | ICD-10-CM | POA: Diagnosis not present

## 2017-11-05 DIAGNOSIS — M5442 Lumbago with sciatica, left side: Secondary | ICD-10-CM | POA: Diagnosis not present

## 2017-11-05 NOTE — Telephone Encounter (Signed)
Verbal order given for 2 follow up home visits

## 2017-11-05 NOTE — Patient Outreach (Signed)
[facility]) Care Management  11/05/2017   [name] 1951/03/07 [id number]  Subjective: Telephone call to the patient for assessment. HIPAA verified.  The patient states that she has been doing fair.  She states that she has chronic pain that she rates at a 7/10.  She is still having headaches and has seen Dr Rita Ohara.  She states that she has had an MRI done and will receive the results when she goes to her appointment on 11/25/17.  She states that her blood pressure today at 245 pm was 169/80.  She states that she has been watching the salt in her diet and only uses ground pepper for taste on her food. She states that she is adherent with her medications but was unable to go over them with me. The patient states that she had a fall one week ago. She denies any injury.   Her legs gave out and she feels that is may be coming from her lower back.  She states that she may need to have surgery.  She has an appointment with her PCP on September 3 rd. I discussed with the patient about fall prevention and she verbalized understanding.    Current Medications:  Current Outpatient Medications  Medication Sig Dispense Refill  . ACCU-CHEK SMARTVIEW test strip TEST BLOOD SUGAR FOUR TIMES DAILY AS DIRECTED 350 each 2  . albuterol (PROVENTIL HFA;VENTOLIN HFA) 108 (90 Base) MCG/ACT inhaler Inhale 1-2 puffs every 6 hours as needed for wheezing, shortness of breath 3 Inhaler 3  . amLODipine (NORVASC) 5 MG tablet Take 1 tablet (5 mg total) by mouth daily. 30 tablet 1  . ascorbic acid (VITAMIN C) 500 MG tablet Take 500 mg by mouth daily.    [name] aspirin EC 81 MG tablet Take 81 mg by mouth daily.    . cetirizine (ZYRTEC) 10 MG tablet Take 1 tablet (10 mg total) by mouth daily. 7 tablet 0  . Cholecalciferol (VITAMIN D3) 5000 units CAPS Take 1 capsule by mouth daily.    . cloNIDine (CATAPRES) 0.3 MG tablet Take 0.3 mg by mouth 3 (three) times daily.    . diclofenac sodium (VOLTAREN) 1 % GEL Apply 2 g  topically daily as needed (pain). Rubs "wherever there is pain" when she doesn't take her pain pill. 100 g 0  . dicyclomine (BENTYL) 10 MG capsule Take 1 tab by mouth twice daily as needed for abdominal cramping. 60 capsule 6  . docusate sodium (COLACE) 100 MG capsule Take 100 mg by mouth 2 (two) times daily.    . hydroxychloroquine (PLAQUENIL) 200 MG tablet Take 200 mg by mouth daily.     . insulin aspart (NOVOLOG FLEXPEN) 100 UNIT/ML FlexPen INJECT 10 TO 14 UNITS INTO THE SKIN 3 (THREE) TIMES DAILY WITH MEALS. 45 mL 3  . Insulin Glargine (TOUJEO SOLOSTAR) 300 UNIT/ML SOPN Inject 20 Units into the skin at bedtime. 9 pen 3  . lamoTRIgine (LAMICTAL) 100 MG tablet Take 1 tablet (100 mg total) by mouth 2 (two) times daily. 180 tablet 3  . levothyroxine (SYNTHROID, LEVOTHROID) 50 MCG tablet TAKE 1 TABLET BY MOUTH ONCE DAILY AND 1/2 TABLET ON SUNDAYS. 30 tablet 5  . lidocaine (XYLOCAINE) 2 % jelly APPLY TO LEFT ABDOMINAL WALL TWICE DAILY. 30 mL 0  . LORazepam (ATIVAN) 0.5 MG tablet Take 1 tablet (0.5 mg total) by mouth 3 (three) times daily. 270 tablet 2  . losartan (COZAAR) 50 MG tablet Take 1 tablet (50 mg total) by  mouth daily. 90 tablet 1  . meclizine (ANTIVERT) 25 MG tablet Take 25 mg by mouth 3 (three) times daily as needed for dizziness. 07/10/17 Has medication and reports not taking often,  but says she takes if dizzy.    . metoprolol tartrate (LOPRESSOR) 50 MG tablet Take 1 tablet (50 mg total) by mouth 2 (two) times daily. 60 tablet 0  . montelukast (SINGULAIR) 10 MG tablet Take 1 tablet (10 mg total) by mouth daily. 90 tablet 1  . Multiple Vitamins-Minerals (HM MULTIVITAMIN ADULT GUMMY PO) Take 1 tablet by mouth daily.    [name] nystatin (NYAMYC) powder APPLY TO AFFECTED AREA 4 TIMES DAILY. 30 g 2  . oxyCODONE-acetaminophen (PERCOCET/ROXICET) 5-325 MG tablet Take 1 tablet by mouth every 6 (six) hours as needed.     . polyethylene glycol (MIRALAX) packet Take 17 g by mouth daily. 14 each 0  .  pregabalin (LYRICA) 75 MG capsule Take 1 capsule (75 mg total) by mouth daily.    . RABEprazole (ACIPHEX) 20 MG tablet Take 1 tablet (20 mg total) by mouth 2 (two) times daily. 180 tablet 3  . RESTASIS 0.05 % ophthalmic emulsion Place 1 drop into both eyes 2 (two) times daily.     . risperiDONE (RISPERDAL) 0.5 MG tablet Take 1 tablet (0.5 mg total) by mouth at bedtime. 90 tablet 2  . rosuvastatin (CRESTOR) 5 MG tablet Take 1 tablet (5 mg total) by mouth at bedtime. 90 tablet 1  . sertraline (ZOLOFT) 100 MG tablet Take 1 tablet (100 mg total) by mouth daily. 90 tablet 2  . traZODone (DESYREL) 100 MG tablet Take 1 tablet (100 mg total) by mouth at bedtime. 90 tablet 2  . triamcinolone ointment (KENALOG) 0.1 % Apply 1 application topically at bedtime. Apply to elbows at bedtime.     No current facility-administered medications for this visit.     Functional Status:  In your present state of health, do you have any difficulty performing the following activities: 10/30/2017 10/28/2017  Hearing? - N  Vision? - N  Difficulty concentrating or making decisions? - N  Walking or climbing stairs? - Y  Dressing or bathing? - N  Doing errands, shopping? N N  Some recent data might be hidden    Fall/Depression Screening: Fall Risk  11/05/2017 10/08/2017 09/26/2017  Falls in the past year? No Yes No  Number falls in past yr: 1 1 -  Injury with Fall? - No -  Comment - - -  Risk Factor Category  - - -  Risk for fall due to : - - -  Follow up - - -   PHQ 2/9 Scores 10/08/2017 08/08/2017 06/29/2017 06/28/2017 04/30/2017 04/11/2017 12/06/2016  PHQ - 2 Score 3 2 1 1 1 1 2   PHQ- 9 Score 16 5 - - - - 5  Some encounter information is confidential and restricted. Go to Review Flowsheets activity to see all data.    Assessment: Patient will continue to benefit from health coach outreach for disease management and support. THN CM Care Plan Problem One     Most Recent Value  THN Long Term Goal   In 90 days the  patient will verbalize lowering her blood pressure and no hospital visits  THN Long Term Goal Start Date  11/05/17  Interventions for Problem One Long Term Goal  discussed diet and medication adherence     Plan: [place] will contact patient in the month of September and patient agrees  to next outreach.  [name] RN, BSN, [place] Direct Dial:  [phone]  Fax: [phone]

## 2017-11-05 NOTE — Telephone Encounter (Signed)
Holland Commons is Ms Claar's Education officer, museum.  She need to have a rx for a walker and needs 2 follow up visits for home care.  Please call her at [phone]

## 2017-11-06 ENCOUNTER — Other Ambulatory Visit: Payer: Self-pay

## 2017-11-06 DIAGNOSIS — M5442 Lumbago with sciatica, left side: Secondary | ICD-10-CM | POA: Diagnosis not present

## 2017-11-06 DIAGNOSIS — Z794 Long term (current) use of insulin: Secondary | ICD-10-CM | POA: Diagnosis not present

## 2017-11-06 DIAGNOSIS — E119 Type 2 diabetes mellitus without complications: Secondary | ICD-10-CM | POA: Diagnosis not present

## 2017-11-06 DIAGNOSIS — M4716 Other spondylosis with myelopathy, lumbar region: Secondary | ICD-10-CM | POA: Diagnosis not present

## 2017-11-06 DIAGNOSIS — R260 Ataxic gait: Secondary | ICD-10-CM | POA: Diagnosis not present

## 2017-11-06 DIAGNOSIS — F060 Psychotic disorder with hallucinations due to known physiological condition: Secondary | ICD-10-CM | POA: Diagnosis not present

## 2017-11-06 DIAGNOSIS — M5136 Other intervertebral disc degeneration, lumbar region: Secondary | ICD-10-CM | POA: Diagnosis not present

## 2017-11-06 DIAGNOSIS — M542 Cervicalgia: Secondary | ICD-10-CM | POA: Diagnosis not present

## 2017-11-06 DIAGNOSIS — G8929 Other chronic pain: Secondary | ICD-10-CM | POA: Diagnosis not present

## 2017-11-06 NOTE — Patient Outreach (Signed)
[facility] [facility]) Care Management  11/06/2017  [name] 1950/11/27 [id number]   Received a call from the patient stating that someone called her and said that they were coming to her home today at 12 pm for a visit.  I advised the patient that I did speak with her for our monthly call but do not come to the home. I also reviewed the chart to see if anyone had made a note regarding coming to the home.  I advised her that I did not see any notes.    Plan:  RN Health coach will outreach the patient at the next scheduled interval.  [name] RN, BSN, [place] Direct Dial:  [phone]  Fax: [phone]

## 2017-11-07 ENCOUNTER — Ambulatory Visit (HOSPITAL_COMMUNITY): Admission: RE | Admit: 2017-11-07 | Payer: Medicare HMO | Source: Ambulatory Visit

## 2017-11-07 DIAGNOSIS — R200 Anesthesia of skin: Secondary | ICD-10-CM | POA: Diagnosis not present

## 2017-11-08 DIAGNOSIS — G8929 Other chronic pain: Secondary | ICD-10-CM | POA: Diagnosis not present

## 2017-11-08 DIAGNOSIS — M5136 Other intervertebral disc degeneration, lumbar region: Secondary | ICD-10-CM | POA: Diagnosis not present

## 2017-11-08 DIAGNOSIS — Z794 Long term (current) use of insulin: Secondary | ICD-10-CM | POA: Diagnosis not present

## 2017-11-08 DIAGNOSIS — E119 Type 2 diabetes mellitus without complications: Secondary | ICD-10-CM | POA: Diagnosis not present

## 2017-11-08 DIAGNOSIS — R260 Ataxic gait: Secondary | ICD-10-CM | POA: Diagnosis not present

## 2017-11-08 DIAGNOSIS — M5442 Lumbago with sciatica, left side: Secondary | ICD-10-CM | POA: Diagnosis not present

## 2017-11-08 DIAGNOSIS — M4716 Other spondylosis with myelopathy, lumbar region: Secondary | ICD-10-CM | POA: Diagnosis not present

## 2017-11-08 DIAGNOSIS — M542 Cervicalgia: Secondary | ICD-10-CM | POA: Diagnosis not present

## 2017-11-08 DIAGNOSIS — F060 Psychotic disorder with hallucinations due to known physiological condition: Secondary | ICD-10-CM | POA: Diagnosis not present

## 2017-11-09 ENCOUNTER — Encounter (HOSPITAL_COMMUNITY): Payer: Self-pay | Admitting: Psychiatry

## 2017-11-09 ENCOUNTER — Ambulatory Visit (INDEPENDENT_AMBULATORY_CARE_PROVIDER_SITE_OTHER): Payer: Medicare HMO | Admitting: Psychiatry

## 2017-11-09 VITALS — BP 176/81 | HR 97 | Ht 59.0 in | Wt 152.0 lb

## 2017-11-09 DIAGNOSIS — G8929 Other chronic pain: Secondary | ICD-10-CM | POA: Diagnosis not present

## 2017-11-09 DIAGNOSIS — E119 Type 2 diabetes mellitus without complications: Secondary | ICD-10-CM | POA: Diagnosis not present

## 2017-11-09 DIAGNOSIS — M5442 Lumbago with sciatica, left side: Secondary | ICD-10-CM | POA: Diagnosis not present

## 2017-11-09 DIAGNOSIS — M4716 Other spondylosis with myelopathy, lumbar region: Secondary | ICD-10-CM | POA: Diagnosis not present

## 2017-11-09 DIAGNOSIS — R260 Ataxic gait: Secondary | ICD-10-CM | POA: Diagnosis not present

## 2017-11-09 DIAGNOSIS — F060 Psychotic disorder with hallucinations due to known physiological condition: Secondary | ICD-10-CM | POA: Diagnosis not present

## 2017-11-09 DIAGNOSIS — F17210 Nicotine dependence, cigarettes, uncomplicated: Secondary | ICD-10-CM

## 2017-11-09 DIAGNOSIS — R32 Unspecified urinary incontinence: Secondary | ICD-10-CM | POA: Diagnosis not present

## 2017-11-09 DIAGNOSIS — F331 Major depressive disorder, recurrent, moderate: Secondary | ICD-10-CM | POA: Diagnosis not present

## 2017-11-09 DIAGNOSIS — M150 Primary generalized (osteo)arthritis: Secondary | ICD-10-CM | POA: Diagnosis not present

## 2017-11-09 DIAGNOSIS — M5136 Other intervertebral disc degeneration, lumbar region: Secondary | ICD-10-CM | POA: Diagnosis not present

## 2017-11-09 DIAGNOSIS — M542 Cervicalgia: Secondary | ICD-10-CM | POA: Diagnosis not present

## 2017-11-09 DIAGNOSIS — I639 Cerebral infarction, unspecified: Secondary | ICD-10-CM | POA: Diagnosis not present

## 2017-11-09 DIAGNOSIS — Z794 Long term (current) use of insulin: Secondary | ICD-10-CM | POA: Diagnosis not present

## 2017-11-09 MED ORDER — RISPERIDONE 0.5 MG PO TABS: 0.5000 mg | ORAL_TABLET | Freq: Every day | ORAL | 2 refills | Status: DC

## 2017-11-09 MED ORDER — TRAZODONE HCL 50 MG PO TABS: 50.0000 mg | ORAL_TABLET | Freq: Every day | ORAL | 2 refills | Status: DC

## 2017-11-09 MED ORDER — SERTRALINE HCL 100 MG PO TABS: 100.0000 mg | ORAL_TABLET | Freq: Every day | ORAL | 2 refills | Status: DC

## 2017-11-09 MED ORDER — LORAZEPAM 0.5 MG PO TABS: 0.5000 mg | ORAL_TABLET | Freq: Two times a day (BID) | ORAL | 2 refills | Status: DC
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
## 2017-11-11 DIAGNOSIS — I10 Essential (primary) hypertension: Secondary | ICD-10-CM | POA: Diagnosis not present

## 2017-11-11 DIAGNOSIS — J449 Chronic obstructive pulmonary disease, unspecified: Secondary | ICD-10-CM | POA: Diagnosis not present

## 2017-11-13 ENCOUNTER — Ambulatory Visit: Payer: Self-pay | Admitting: Family Medicine

## 2017-11-13 ENCOUNTER — Telehealth: Payer: Self-pay | Admitting: Family Medicine

## 2017-11-13 DIAGNOSIS — G8929 Other chronic pain: Secondary | ICD-10-CM | POA: Diagnosis not present

## 2017-11-13 DIAGNOSIS — M5136 Other intervertebral disc degeneration, lumbar region: Secondary | ICD-10-CM | POA: Diagnosis not present

## 2017-11-13 DIAGNOSIS — M4716 Other spondylosis with myelopathy, lumbar region: Secondary | ICD-10-CM | POA: Diagnosis not present

## 2017-11-13 DIAGNOSIS — M542 Cervicalgia: Secondary | ICD-10-CM | POA: Diagnosis not present

## 2017-11-13 DIAGNOSIS — I10 Essential (primary) hypertension: Secondary | ICD-10-CM

## 2017-11-13 DIAGNOSIS — Q7649 Other congenital malformations of spine, not associated with scoliosis: Secondary | ICD-10-CM

## 2017-11-13 DIAGNOSIS — M5442 Lumbago with sciatica, left side: Secondary | ICD-10-CM | POA: Diagnosis not present

## 2017-11-13 DIAGNOSIS — R260 Ataxic gait: Secondary | ICD-10-CM | POA: Diagnosis not present

## 2017-11-13 DIAGNOSIS — E119 Type 2 diabetes mellitus without complications: Secondary | ICD-10-CM | POA: Diagnosis not present

## 2017-11-13 DIAGNOSIS — Z794 Long term (current) use of insulin: Secondary | ICD-10-CM | POA: Diagnosis not present

## 2017-11-13 DIAGNOSIS — F060 Psychotic disorder with hallucinations due to known physiological condition: Secondary | ICD-10-CM | POA: Diagnosis not present

## 2017-11-13 DIAGNOSIS — I959 Hypotension, unspecified: Secondary | ICD-10-CM

## 2017-11-13 NOTE — Telephone Encounter (Signed)
Noted. Pt has appt tomorrow.

## 2017-11-13 NOTE — Telephone Encounter (Signed)
11/09/17 call 2:05pm - LMOM - Ms Crocket's BP is 164/82.  Needed to let us know that it is high

## 2017-11-14 ENCOUNTER — Ambulatory Visit: Payer: Self-pay | Admitting: Family Medicine

## 2017-11-14 DIAGNOSIS — M5136 Other intervertebral disc degeneration, lumbar region: Secondary | ICD-10-CM | POA: Diagnosis not present

## 2017-11-14 DIAGNOSIS — M4716 Other spondylosis with myelopathy, lumbar region: Secondary | ICD-10-CM | POA: Diagnosis not present

## 2017-11-14 DIAGNOSIS — E119 Type 2 diabetes mellitus without complications: Secondary | ICD-10-CM | POA: Diagnosis not present

## 2017-11-14 DIAGNOSIS — F060 Psychotic disorder with hallucinations due to known physiological condition: Secondary | ICD-10-CM | POA: Diagnosis not present

## 2017-11-14 DIAGNOSIS — M542 Cervicalgia: Secondary | ICD-10-CM | POA: Diagnosis not present

## 2017-11-14 DIAGNOSIS — Z794 Long term (current) use of insulin: Secondary | ICD-10-CM | POA: Diagnosis not present

## 2017-11-14 DIAGNOSIS — R260 Ataxic gait: Secondary | ICD-10-CM | POA: Diagnosis not present

## 2017-11-14 DIAGNOSIS — G8929 Other chronic pain: Secondary | ICD-10-CM | POA: Diagnosis not present

## 2017-11-14 DIAGNOSIS — M5442 Lumbago with sciatica, left side: Secondary | ICD-10-CM | POA: Diagnosis not present

## 2017-11-15 ENCOUNTER — Telehealth: Payer: Self-pay | Admitting: Family Medicine

## 2017-11-15 NOTE — Telephone Encounter (Signed)
Patient left voicemail requesting you to call her back to discuss her paperwork.

## 2017-11-16 ENCOUNTER — Other Ambulatory Visit: Payer: Self-pay

## 2017-11-16 ENCOUNTER — Other Ambulatory Visit: Payer: Self-pay | Admitting: Internal Medicine

## 2017-11-16 DIAGNOSIS — E119 Type 2 diabetes mellitus without complications: Secondary | ICD-10-CM | POA: Diagnosis not present

## 2017-11-16 DIAGNOSIS — M5136 Other intervertebral disc degeneration, lumbar region: Secondary | ICD-10-CM | POA: Diagnosis not present

## 2017-11-16 DIAGNOSIS — F060 Psychotic disorder with hallucinations due to known physiological condition: Secondary | ICD-10-CM | POA: Diagnosis not present

## 2017-11-16 DIAGNOSIS — G8929 Other chronic pain: Secondary | ICD-10-CM | POA: Diagnosis not present

## 2017-11-16 DIAGNOSIS — Z794 Long term (current) use of insulin: Secondary | ICD-10-CM | POA: Diagnosis not present

## 2017-11-16 DIAGNOSIS — R260 Ataxic gait: Secondary | ICD-10-CM | POA: Diagnosis not present

## 2017-11-16 DIAGNOSIS — M5442 Lumbago with sciatica, left side: Secondary | ICD-10-CM | POA: Diagnosis not present

## 2017-11-16 DIAGNOSIS — M542 Cervicalgia: Secondary | ICD-10-CM | POA: Diagnosis not present

## 2017-11-16 DIAGNOSIS — M4716 Other spondylosis with myelopathy, lumbar region: Secondary | ICD-10-CM | POA: Diagnosis not present

## 2017-11-16 MED ORDER — INSULIN PEN NEEDLE 32G X 4 MM MISC: 0 refills | Status: DC

## 2017-11-19 ENCOUNTER — Telehealth: Payer: Self-pay | Admitting: Internal Medicine

## 2017-11-19 DIAGNOSIS — M4716 Other spondylosis with myelopathy, lumbar region: Secondary | ICD-10-CM | POA: Diagnosis not present

## 2017-11-19 DIAGNOSIS — M542 Cervicalgia: Secondary | ICD-10-CM | POA: Diagnosis not present

## 2017-11-19 DIAGNOSIS — G8929 Other chronic pain: Secondary | ICD-10-CM | POA: Diagnosis not present

## 2017-11-19 DIAGNOSIS — M5136 Other intervertebral disc degeneration, lumbar region: Secondary | ICD-10-CM | POA: Diagnosis not present

## 2017-11-19 DIAGNOSIS — M5442 Lumbago with sciatica, left side: Secondary | ICD-10-CM | POA: Diagnosis not present

## 2017-11-19 DIAGNOSIS — F060 Psychotic disorder with hallucinations due to known physiological condition: Secondary | ICD-10-CM | POA: Diagnosis not present

## 2017-11-19 DIAGNOSIS — R260 Ataxic gait: Secondary | ICD-10-CM | POA: Diagnosis not present

## 2017-11-19 DIAGNOSIS — E119 Type 2 diabetes mellitus without complications: Secondary | ICD-10-CM | POA: Diagnosis not present

## 2017-11-19 DIAGNOSIS — Z794 Long term (current) use of insulin: Secondary | ICD-10-CM | POA: Diagnosis not present

## 2017-11-19 NOTE — Telephone Encounter (Signed)
Patient is requesting a call back to discuss "something" that was suppose to be sent in for her diabetic supplies.   Please advise

## 2017-11-19 NOTE — Telephone Encounter (Signed)
LMTCB

## 2017-11-20 ENCOUNTER — Encounter: Payer: Self-pay | Admitting: Family Medicine

## 2017-11-20 ENCOUNTER — Ambulatory Visit (INDEPENDENT_AMBULATORY_CARE_PROVIDER_SITE_OTHER): Payer: Medicare HMO | Admitting: Family Medicine

## 2017-11-20 VITALS — BP 118/58 | HR 71 | Resp 15 | Ht 59.0 in | Wt 158.0 lb

## 2017-11-20 DIAGNOSIS — M5442 Lumbago with sciatica, left side: Secondary | ICD-10-CM

## 2017-11-20 DIAGNOSIS — E119 Type 2 diabetes mellitus without complications: Secondary | ICD-10-CM | POA: Diagnosis not present

## 2017-11-20 DIAGNOSIS — J449 Chronic obstructive pulmonary disease, unspecified: Secondary | ICD-10-CM

## 2017-11-20 DIAGNOSIS — M4716 Other spondylosis with myelopathy, lumbar region: Secondary | ICD-10-CM | POA: Diagnosis not present

## 2017-11-20 DIAGNOSIS — Z23 Encounter for immunization: Secondary | ICD-10-CM

## 2017-11-20 DIAGNOSIS — M542 Cervicalgia: Secondary | ICD-10-CM | POA: Diagnosis not present

## 2017-11-20 DIAGNOSIS — E1142 Type 2 diabetes mellitus with diabetic polyneuropathy: Secondary | ICD-10-CM

## 2017-11-20 DIAGNOSIS — M79602 Pain in left arm: Secondary | ICD-10-CM

## 2017-11-20 DIAGNOSIS — I10 Essential (primary) hypertension: Secondary | ICD-10-CM

## 2017-11-20 DIAGNOSIS — Z794 Long term (current) use of insulin: Secondary | ICD-10-CM | POA: Diagnosis not present

## 2017-11-20 DIAGNOSIS — Z09 Encounter for follow-up examination after completed treatment for conditions other than malignant neoplasm: Secondary | ICD-10-CM | POA: Diagnosis not present

## 2017-11-20 DIAGNOSIS — R296 Repeated falls: Secondary | ICD-10-CM | POA: Diagnosis not present

## 2017-11-20 DIAGNOSIS — M5136 Other intervertebral disc degeneration, lumbar region: Secondary | ICD-10-CM | POA: Diagnosis not present

## 2017-11-20 DIAGNOSIS — R260 Ataxic gait: Secondary | ICD-10-CM | POA: Diagnosis not present

## 2017-11-20 DIAGNOSIS — G8929 Other chronic pain: Secondary | ICD-10-CM | POA: Diagnosis not present

## 2017-11-20 DIAGNOSIS — F060 Psychotic disorder with hallucinations due to known physiological condition: Secondary | ICD-10-CM | POA: Diagnosis not present

## 2017-11-20 MED ORDER — UNABLE TO FIND: 0 refills | Status: DC

## 2017-11-20 MED ORDER — METHYLPREDNISOLONE ACETATE 80 MG/ML IJ SUSP
80.0000 mg | Freq: Once | INTRAMUSCULAR | Status: AC
  Administered 2017-11-20: 80 mg via INTRAMUSCULAR

## 2017-11-20 NOTE — Patient Instructions (Addendum)
F/U in 3 months call if you need me before   Flu vaccine  Today  Foot exam today  No changes In medication  We are sending for walker with bench and light weight wheelchair, you will let us know where to send for lift  Careful not to fall and enjoy new caregivers  Thank you  for choosing [facility]. We consider it a privelige to serve you.  Delivering excellent health care in a caring and  compassionate way is our goal.  Partnering with you,  so that together we can achieve this goal is our strategy.

## 2017-11-20 NOTE — Telephone Encounter (Signed)
Pt had a visit in office this am

## 2017-11-21 ENCOUNTER — Ambulatory Visit (HOSPITAL_COMMUNITY): Payer: Self-pay | Admitting: Psychiatry

## 2017-11-21 ENCOUNTER — Telehealth: Payer: Self-pay | Admitting: Internal Medicine

## 2017-11-21 NOTE — Telephone Encounter (Signed)
Patient is returning a call from the office She stated the best time to call would after 12.

## 2017-11-22 ENCOUNTER — Ambulatory Visit (HOSPITAL_COMMUNITY): Admission: RE | Admit: 2017-11-22 | Payer: Medicare HMO | Source: Ambulatory Visit

## 2017-11-22 ENCOUNTER — Telehealth: Payer: Self-pay | Admitting: Family Medicine

## 2017-11-22 DIAGNOSIS — R260 Ataxic gait: Secondary | ICD-10-CM | POA: Diagnosis not present

## 2017-11-22 DIAGNOSIS — E119 Type 2 diabetes mellitus without complications: Secondary | ICD-10-CM | POA: Diagnosis not present

## 2017-11-22 DIAGNOSIS — G894 Chronic pain syndrome: Secondary | ICD-10-CM | POA: Diagnosis not present

## 2017-11-22 DIAGNOSIS — M5136 Other intervertebral disc degeneration, lumbar region: Secondary | ICD-10-CM | POA: Diagnosis not present

## 2017-11-22 DIAGNOSIS — M5030 Other cervical disc degeneration, unspecified cervical region: Secondary | ICD-10-CM | POA: Diagnosis not present

## 2017-11-22 DIAGNOSIS — Z79891 Long term (current) use of opiate analgesic: Secondary | ICD-10-CM | POA: Diagnosis not present

## 2017-11-22 DIAGNOSIS — M542 Cervicalgia: Secondary | ICD-10-CM | POA: Diagnosis not present

## 2017-11-22 DIAGNOSIS — G8929 Other chronic pain: Secondary | ICD-10-CM | POA: Diagnosis not present

## 2017-11-22 DIAGNOSIS — M4716 Other spondylosis with myelopathy, lumbar region: Secondary | ICD-10-CM | POA: Diagnosis not present

## 2017-11-22 DIAGNOSIS — Z794 Long term (current) use of insulin: Secondary | ICD-10-CM | POA: Diagnosis not present

## 2017-11-22 DIAGNOSIS — F060 Psychotic disorder with hallucinations due to known physiological condition: Secondary | ICD-10-CM | POA: Diagnosis not present

## 2017-11-22 DIAGNOSIS — M5442 Lumbago with sciatica, left side: Secondary | ICD-10-CM | POA: Diagnosis not present

## 2017-11-22 MED ORDER — INSULIN PEN NEEDLE 32G X 4 MM MISC: 0 refills | Status: DC

## 2017-11-22 NOTE — Addendum Note (Signed)
Addended by: Drucilla Schmidt on: 11/22/2017 01:43 PM   Modules accepted: Orders

## 2017-11-22 NOTE — Telephone Encounter (Signed)
Got a letter from [facility] stating that they are waiting on "something" for "something". Advised pt to call [employer] in regards to this I have no idea of what letter she is referring to unfortunately.

## 2017-11-22 NOTE — Telephone Encounter (Signed)
Called pt LVM  that the [place] letter was completed, and its placed in the [employer] up front.

## 2017-11-23 DIAGNOSIS — M75102 Unspecified rotator cuff tear or rupture of left shoulder, not specified as traumatic: Secondary | ICD-10-CM | POA: Diagnosis not present

## 2017-11-23 DIAGNOSIS — D329 Benign neoplasm of meninges, unspecified: Secondary | ICD-10-CM | POA: Diagnosis not present

## 2017-11-23 DIAGNOSIS — M4722 Other spondylosis with radiculopathy, cervical region: Secondary | ICD-10-CM | POA: Diagnosis not present

## 2017-11-23 DIAGNOSIS — M5030 Other cervical disc degeneration, unspecified cervical region: Secondary | ICD-10-CM | POA: Diagnosis not present

## 2017-11-25 ENCOUNTER — Encounter: Payer: Self-pay | Admitting: Family Medicine

## 2017-11-25 DIAGNOSIS — Z09 Encounter for follow-up examination after completed treatment for conditions other than malignant neoplasm: Secondary | ICD-10-CM | POA: Insufficient documentation

## 2017-11-25 NOTE — Assessment & Plan Note (Signed)
Needs to quit smoking to improve lung disease

## 2017-11-25 NOTE — Assessment & Plan Note (Signed)
Controlled, no change in medication  

## 2017-11-25 NOTE — Assessment & Plan Note (Signed)
Managed by endocrinology and controled [name] is reminded of the importance of commitment to daily physical activity for 30 minutes or more, as able and the need to limit carbohydrate intake to 30 to 60 grams per meal to help with blood sugar control.   The need to take medication as prescribed, test blood sugar as directed, and to call between visits if there is a concern that blood sugar is uncontrolled is also discussed.   [name] is reminded of the importance of daily foot exam, annual eye examination, and good blood sugar, blood pressure and cholesterol control.  Diabetic Labs Latest Ref Rng & Units 11/01/2017 10/31/2017 10/29/2017 10/28/2017 10/15/2017  HbA1c 4.0 - 5.6 % - - - - -  Microalbumin <2.0 mg/dL - - - - -  Micro/Creat Ratio 0.0 - 30.0 mg/g - - - - -  Chol 100 - 199 mg/dL - - - - -  HDL >39 mg/dL - - - - -  Calc LDL 0 - 99 mg/dL - - - - -  Triglycerides 0 - 149 mg/dL - - - - -  Creatinine 0.44 - 1.00 mg/dL 1.14(H) 1.55(H) 1.01(H) 1.23(H) 1.06(H)  GFR >60.00 mL/min - - - - -   BP/Weight 11/20/2017 11/01/2017 10/31/2017 10/30/2017 10/28/2017 10/23/2017 7/62/8315  Systolic BP 176 160 737 106 - 269 485  Diastolic BP 58 68 59 60 - 41 46  Wt. (Lbs) 158 - 156.8 - 156.53 156.3 145  BMI 31.91 - 31.67 - 31.61 31.57 29.29  Some encounter information is confidential and restricted. Go to Review Flowsheets activity to see all data.   Foot/eye exam completion dates Latest Ref Rng & Units 11/20/2017 03/28/2017  Eye Exam No Retinopathy - Retinopathy(A)  Foot exam Order - - -  Foot Form Completion - Done -

## 2017-11-25 NOTE — Assessment & Plan Note (Signed)
Hospital course of 8/19 to 10/30/2017 reviewed as documented. subsequent ED visits also reviewed also the fact that placement seriously presented as a viable option , based on recurrent presentation, however patient refuse and is of sound mind. She needs maximal home health care as already stated , as well as the equipment she requests for increased safety both inside and outside of her home, as well as improved quality of life

## 2017-11-25 NOTE — Assessment & Plan Note (Signed)
Need for walker with bench, lightweight wheelchair and lift chair justified, based on severe spinal pathology with recurrent falls, due both to this as well as to her chronic medical illnesses which require multiple medications for management

## 2017-11-25 NOTE — Progress Notes (Signed)
[name]     MRN: [id number]      DOB: Jul 22, 1950   HPI [name] is here for follow up of hospitalization from 8/19 to 10/30/2017 when she presented with bilateral lower extremity weakness  Which resulted in her falling. Of note , she has a h/o recurrent falls and weakness, and she does have severe bilateral foraminal narrowing at L5/ S1 , and is s/p decompression of L3/4. No neurologic issue after imaging of the spine and she has been evaluated by her neurosurgeon since discharge Other medical conditions are stable, suggestion was made to wean her from clonidine to amlodipine , less likelihood of rebound, this will be approached gradually ,and is rational. [name] has been in the ED 3 times since that hospital stay, major attempts were made to place her is a skilled facility which she has repeatedly resisted. She now comes in accompanied by a Provider from a new company, so far, which is less than 2 weeks, things seem to be going well, she needs the maximum number of hours which she can get as she has multiple medications and many of which are potentially sedating, and she is n a complex medication regime, also her chronic conditions place her at high fall risk, an limit her ability to safely care for herself in her home environment  She is requesting a walker with a bench, a light weight wheelchair , so that she can be pushed  Around by her caregiver outside of the home,a nd also still needs the lift chair, all of these will improve her safety  Both inside and outside of the home as well as the quality of her life Denies polyuria, polydipsia, blurred vision , or hypoglycemic episodes.   ROS See HPI  Denies recent fever or chills. Denies sinus pressure, nasal congestion, ear pain or sore throat. Denies chest congestion, productive cough or wheezing. Denies chest pains, palpitations and leg swelling Denies abdominal pain, nausea, vomiting,diarrhea or constipation.   Denies dysuria,  frequency, hesitancy or incontinence. Chronic back and lower extremity pain and weakness with limitation in mobility and recurrent falls Denies headaches, seizures,  Denies uncontrolled  depression, anxiety or insomnia. Denies skin break down or rash.   PE  BP (!) 118/58   Pulse 71   Resp 15   Ht 4\' 11"  (1.499 m)   Wt 158 lb (71.7 kg)   SpO2 99%   BMI 31.91 kg/m   Patient alert and oriented and in no cardiopulmonary distress.  HEENT: No facial asymmetry, EOMI,   oropharynx pink and moist.  Neck adequate ROM no JVD, no mass.  Chest: Clear to auscultation bilaterally.  CVS: S1, S2 no murmurs, no S3.Regular rate.  ABD: Soft non tender.   Ext: No edema  [name]: Decreased ROM spine, shoulders, hips and knees.wasting of muscles noted  In both upper and lower extremities Skin: Intact, no ulcerations or rash noted.  Psych: Good eye contact, normal affect. Memory intact not anxious or depressed appearing.  CNS: CN 2-12 intact, grade 4 power in upper and lower extremities   Assessment & Plan Encounter for examination following treatment at [facility] course of 8/19 to 10/30/2017 reviewed as documented. subsequent ED visits also reviewed also the fact that placement seriously presented as a viable option , based on recurrent presentation, however patient refuse and is of sound mind. She needs maximal home health care as already stated , as well as the equipment she requests for increased safety both  inside and outside of her home, as well as improved quality of life   Diabetic polyneuropathy associated with type 2 diabetes mellitus ([place]) Managed by endocrinology and controled [name] is reminded of the importance of commitment to daily physical activity for 30 minutes or more, as able and the need to limit carbohydrate intake to 30 to 60 grams per meal to help with blood sugar control.   The need to take medication as prescribed, test blood sugar as directed, and to call between  visits if there is a concern that blood sugar is uncontrolled is also discussed.   [name] is reminded of the importance of daily foot exam, annual eye examination, and good blood sugar, blood pressure and cholesterol control.  Diabetic Labs Latest Ref Rng & Units 11/01/2017 10/31/2017 10/29/2017 10/28/2017 10/15/2017  HbA1c 4.0 - 5.6 % - - - - -  Microalbumin <2.0 mg/dL - - - - -  Micro/Creat Ratio 0.0 - 30.0 mg/g - - - - -  Chol 100 - 199 mg/dL - - - - -  HDL >39 mg/dL - - - - -  Calc LDL 0 - 99 mg/dL - - - - -  Triglycerides 0 - 149 mg/dL - - - - -  Creatinine 0.44 - 1.00 mg/dL 1.14(H) 1.55(H) 1.01(H) 1.23(H) 1.06(H)  GFR >60.00 mL/min - - - - -   BP/Weight 11/20/2017 11/01/2017 10/31/2017 10/30/2017 10/28/2017 10/23/2017 05/06/8248  Systolic BP 037 048 889 169 - 450 388  Diastolic BP 58 68 59 60 - 41 46  Wt. (Lbs) 158 - 156.8 - 156.53 156.3 145  BMI 31.91 - 31.67 - 31.61 31.57 29.29  Some encounter information is confidential and restricted. Go to Review Flowsheets activity to see all data.   Foot/eye exam completion dates Latest Ref Rng & Units 11/20/2017 03/28/2017  Eye Exam No Retinopathy - Retinopathy(A)  Foot exam Order - - -  Foot Form Completion - Done -         COPD (chronic obstructive pulmonary disease) with chronic bronchitis (HCC) Needs to quit smoking to improve lung disease  Falls frequently Need for walker with bench, lightweight wheelchair and lift chair justified, based on severe spinal pathology with recurrent falls, due both to this as well as to her chronic medical illnesses which require multiple medications for management   Essential hypertension Controlled, no change in medication

## 2017-11-26 ENCOUNTER — Telehealth: Payer: Self-pay | Admitting: Family Medicine

## 2017-11-26 DIAGNOSIS — M5136 Other intervertebral disc degeneration, lumbar region: Secondary | ICD-10-CM | POA: Diagnosis not present

## 2017-11-26 DIAGNOSIS — Z794 Long term (current) use of insulin: Secondary | ICD-10-CM | POA: Diagnosis not present

## 2017-11-26 DIAGNOSIS — E119 Type 2 diabetes mellitus without complications: Secondary | ICD-10-CM | POA: Diagnosis not present

## 2017-11-26 DIAGNOSIS — M542 Cervicalgia: Secondary | ICD-10-CM | POA: Diagnosis not present

## 2017-11-26 DIAGNOSIS — M4716 Other spondylosis with myelopathy, lumbar region: Secondary | ICD-10-CM | POA: Diagnosis not present

## 2017-11-26 DIAGNOSIS — M5442 Lumbago with sciatica, left side: Secondary | ICD-10-CM | POA: Diagnosis not present

## 2017-11-26 DIAGNOSIS — R260 Ataxic gait: Secondary | ICD-10-CM | POA: Diagnosis not present

## 2017-11-26 DIAGNOSIS — G8929 Other chronic pain: Secondary | ICD-10-CM | POA: Diagnosis not present

## 2017-11-26 DIAGNOSIS — F060 Psychotic disorder with hallucinations due to known physiological condition: Secondary | ICD-10-CM | POA: Diagnosis not present

## 2017-11-26 NOTE — Telephone Encounter (Signed)
Pt is calling and states she is on too much medication in the Morning, the medicine is making her loopy, and weak legged. She also feels jittery, please call [phone]

## 2017-11-27 NOTE — Telephone Encounter (Signed)
Called in this am and stated she got up to answer the door for her aid and felt so groggy and dizzy and she knows its because of all the medicine she takes. She is feeling this way from the meds she took lastnight and she is scared to take her morning meds now because of it. Wants to know what she can come off of

## 2017-11-27 NOTE — Telephone Encounter (Signed)
Reduce the number of percocet that she takes per 24 hours by 1  pill

## 2017-11-27 NOTE — Telephone Encounter (Signed)
Called patient to discuss Dr.[name]'s recommendations. She said she isn't taking the Percocet at all. She said she was on the phone with a nurse and asked me to call back later.

## 2017-11-28 ENCOUNTER — Telehealth: Payer: Self-pay | Admitting: Family Medicine

## 2017-11-28 DIAGNOSIS — F060 Psychotic disorder with hallucinations due to known physiological condition: Secondary | ICD-10-CM | POA: Diagnosis not present

## 2017-11-28 DIAGNOSIS — R260 Ataxic gait: Secondary | ICD-10-CM | POA: Diagnosis not present

## 2017-11-28 DIAGNOSIS — M5136 Other intervertebral disc degeneration, lumbar region: Secondary | ICD-10-CM | POA: Diagnosis not present

## 2017-11-28 DIAGNOSIS — M542 Cervicalgia: Secondary | ICD-10-CM | POA: Diagnosis not present

## 2017-11-28 DIAGNOSIS — Z794 Long term (current) use of insulin: Secondary | ICD-10-CM | POA: Diagnosis not present

## 2017-11-28 DIAGNOSIS — M4716 Other spondylosis with myelopathy, lumbar region: Secondary | ICD-10-CM | POA: Diagnosis not present

## 2017-11-28 DIAGNOSIS — M5442 Lumbago with sciatica, left side: Secondary | ICD-10-CM | POA: Diagnosis not present

## 2017-11-28 DIAGNOSIS — E119 Type 2 diabetes mellitus without complications: Secondary | ICD-10-CM | POA: Diagnosis not present

## 2017-11-28 DIAGNOSIS — G8929 Other chronic pain: Secondary | ICD-10-CM | POA: Diagnosis not present

## 2017-11-28 NOTE — Telephone Encounter (Signed)
Called pt back and spoke with her and she states her aide Denman George has her pill organizer fixed now and she sounds better

## 2017-11-28 NOTE — Telephone Encounter (Signed)
Patient states she called you several times yesterday & was cut off.  Requesting returned call [phone]/ [phone]

## 2017-11-29 ENCOUNTER — Ambulatory Visit: Payer: Medicare HMO | Admitting: Podiatry

## 2017-11-30 DIAGNOSIS — Z9181 History of falling: Secondary | ICD-10-CM | POA: Diagnosis not present

## 2017-11-30 DIAGNOSIS — E119 Type 2 diabetes mellitus without complications: Secondary | ICD-10-CM | POA: Diagnosis not present

## 2017-11-30 DIAGNOSIS — I69359 Hemiplegia and hemiparesis following cerebral infarction affecting unspecified side: Secondary | ICD-10-CM | POA: Diagnosis not present

## 2017-11-30 DIAGNOSIS — M6281 Muscle weakness (generalized): Secondary | ICD-10-CM | POA: Diagnosis not present

## 2017-12-02 IMAGING — DX DG FOOT COMPLETE 3+V*L*
3 series · 3 of 3 positions shown · non-contrast
Comparison: None impacts

CLINICAL DATA: Dorsal left foot pain since having a toe nail
removed 1 month ago. History of great toe fracture 20 years ago.

EXAM:
LEFT FOOT - COMPLETE 3+ VIEW

[foot ap]
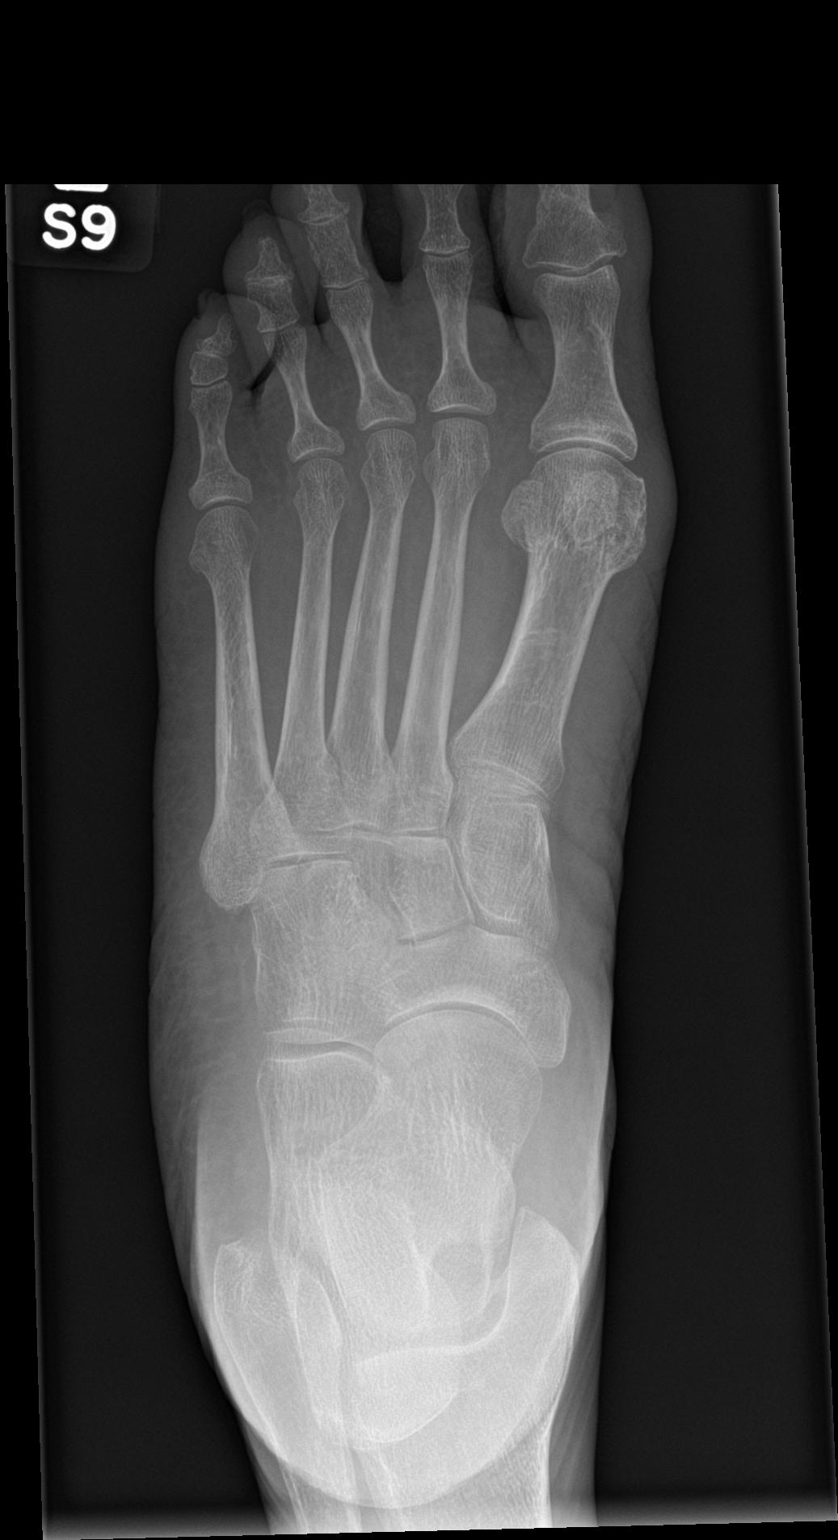

[foot obl]
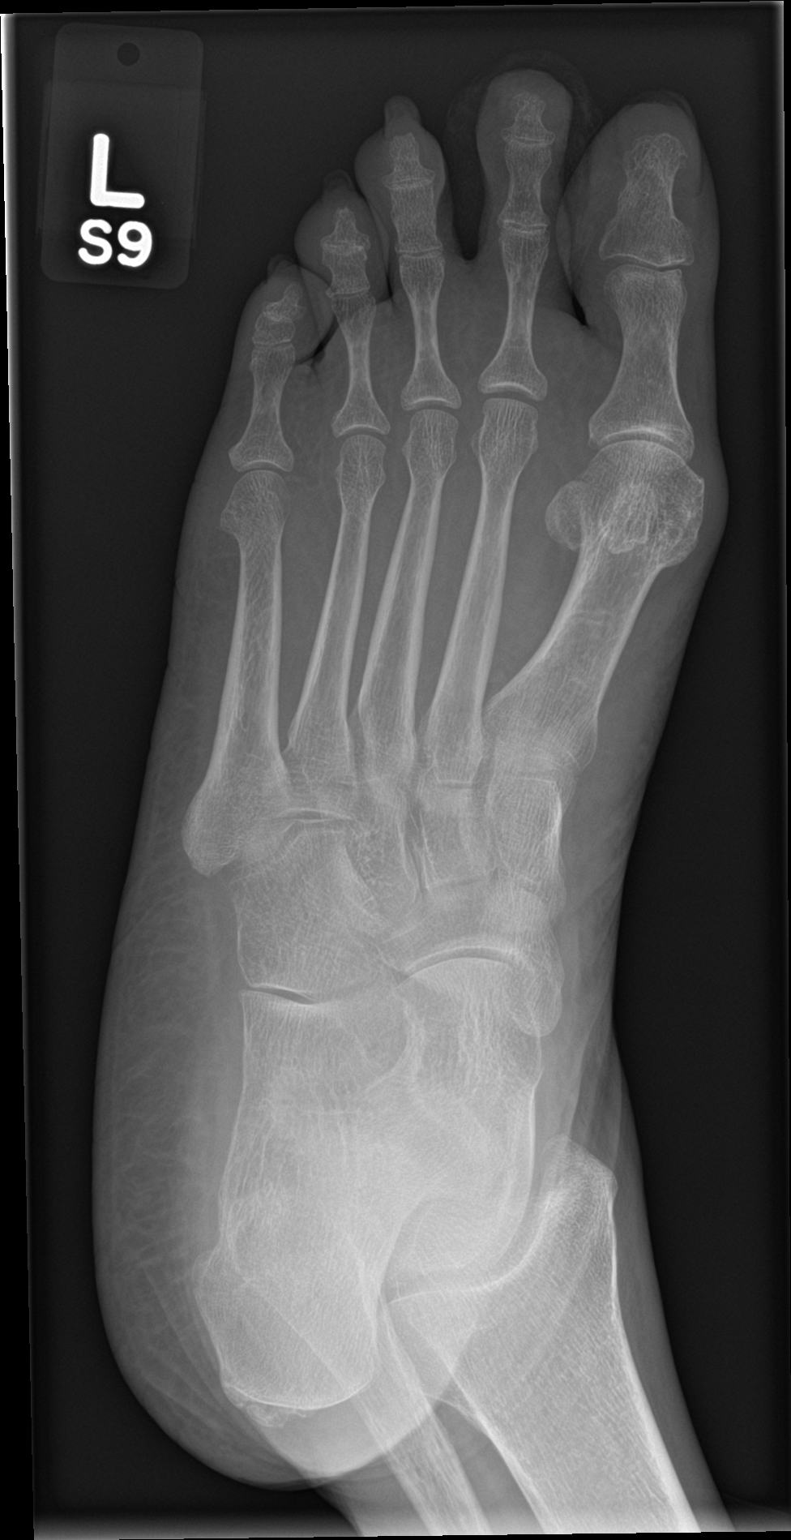

[foot lat]
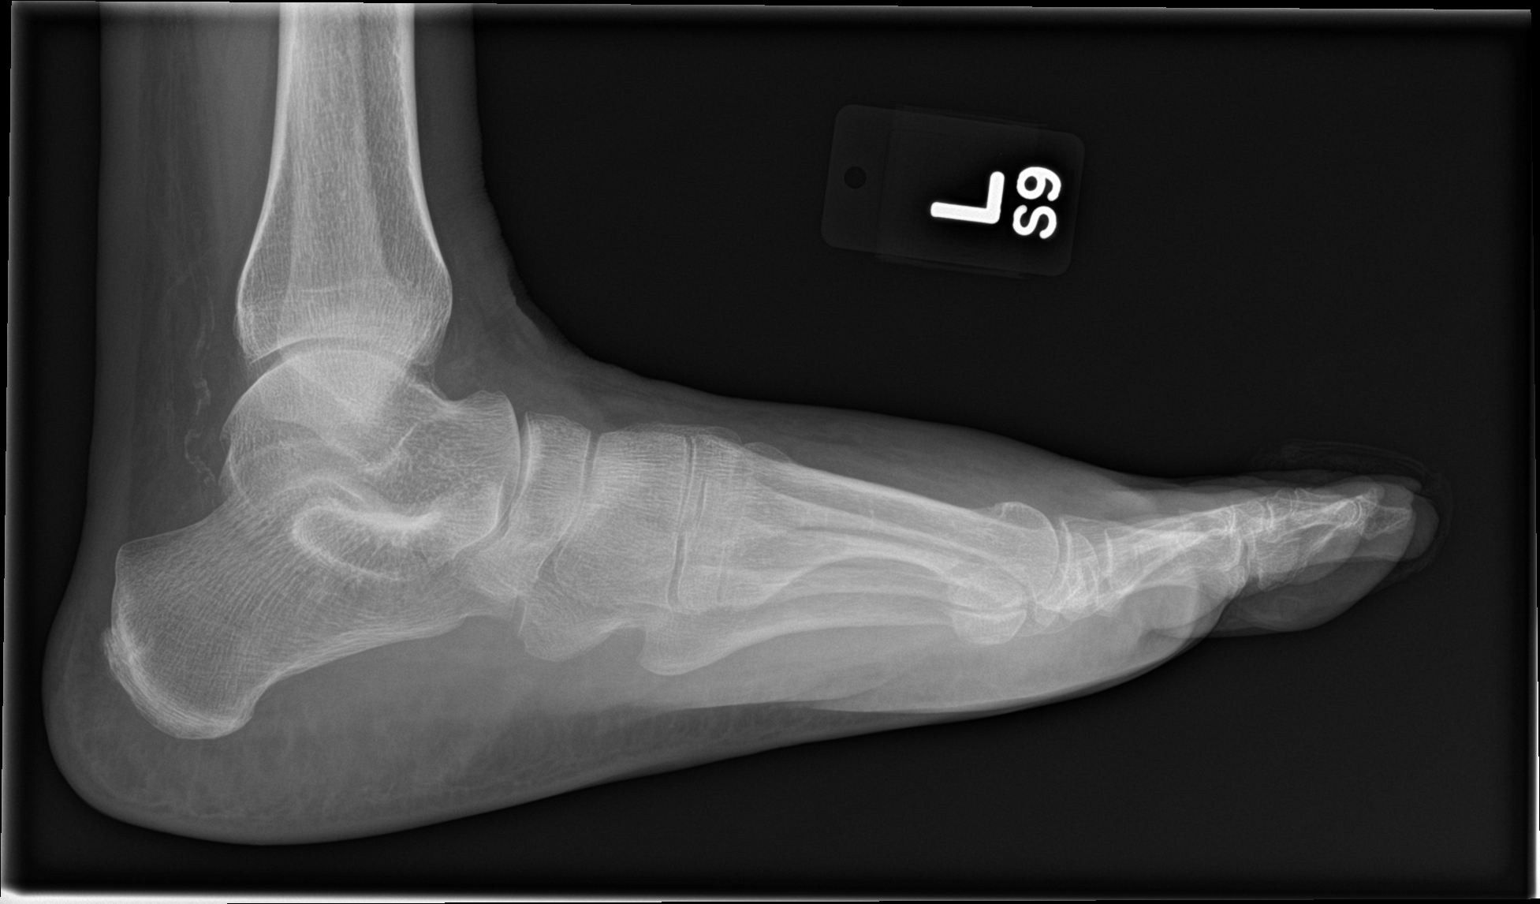

[3 of 3 positions shown; findings below may reference images not displayed]

FINDINGS: The bones are subjectively adequately mineralized. There is no acute
or healing fracture. The interphalangeal and metatarsophalangeal
joint spaces are reasonably well-maintained. No lytic or blastic
lesion or or periosteal reaction is observed. There is soft tissue
swelling over the forefoot. No foreign body or soft tissue gas is
observed. There are arterial calcifications within the posterior
ankle region.
IMPRESSION: There is no acute bony abnormality of the left foot. There is no
significant degenerative joint change. Mild soft tissue swelling
over the dorsum of the forefoot may reflect edema or cellulitis.
Correlation with clinical and laboratory values will be needed. If
the patient's symptoms persist, MRI may be the most useful next
imaging step.

## 2017-12-03 DIAGNOSIS — G8929 Other chronic pain: Secondary | ICD-10-CM | POA: Diagnosis not present

## 2017-12-03 DIAGNOSIS — E119 Type 2 diabetes mellitus without complications: Secondary | ICD-10-CM | POA: Diagnosis not present

## 2017-12-03 DIAGNOSIS — M4716 Other spondylosis with myelopathy, lumbar region: Secondary | ICD-10-CM | POA: Diagnosis not present

## 2017-12-03 DIAGNOSIS — R260 Ataxic gait: Secondary | ICD-10-CM | POA: Diagnosis not present

## 2017-12-03 DIAGNOSIS — Z794 Long term (current) use of insulin: Secondary | ICD-10-CM | POA: Diagnosis not present

## 2017-12-03 DIAGNOSIS — M5442 Lumbago with sciatica, left side: Secondary | ICD-10-CM | POA: Diagnosis not present

## 2017-12-03 DIAGNOSIS — F060 Psychotic disorder with hallucinations due to known physiological condition: Secondary | ICD-10-CM | POA: Diagnosis not present

## 2017-12-03 DIAGNOSIS — M5136 Other intervertebral disc degeneration, lumbar region: Secondary | ICD-10-CM | POA: Diagnosis not present

## 2017-12-03 DIAGNOSIS — M542 Cervicalgia: Secondary | ICD-10-CM | POA: Diagnosis not present

## 2017-12-04 DIAGNOSIS — M4716 Other spondylosis with myelopathy, lumbar region: Secondary | ICD-10-CM | POA: Diagnosis not present

## 2017-12-04 DIAGNOSIS — M5442 Lumbago with sciatica, left side: Secondary | ICD-10-CM | POA: Diagnosis not present

## 2017-12-04 DIAGNOSIS — G8929 Other chronic pain: Secondary | ICD-10-CM | POA: Diagnosis not present

## 2017-12-04 DIAGNOSIS — E119 Type 2 diabetes mellitus without complications: Secondary | ICD-10-CM | POA: Diagnosis not present

## 2017-12-04 DIAGNOSIS — M5136 Other intervertebral disc degeneration, lumbar region: Secondary | ICD-10-CM | POA: Diagnosis not present

## 2017-12-04 DIAGNOSIS — R260 Ataxic gait: Secondary | ICD-10-CM | POA: Diagnosis not present

## 2017-12-04 DIAGNOSIS — F060 Psychotic disorder with hallucinations due to known physiological condition: Secondary | ICD-10-CM | POA: Diagnosis not present

## 2017-12-04 DIAGNOSIS — Z794 Long term (current) use of insulin: Secondary | ICD-10-CM | POA: Diagnosis not present

## 2017-12-04 DIAGNOSIS — M542 Cervicalgia: Secondary | ICD-10-CM | POA: Diagnosis not present

## 2017-12-05 DIAGNOSIS — Z794 Long term (current) use of insulin: Secondary | ICD-10-CM | POA: Diagnosis not present

## 2017-12-05 DIAGNOSIS — M542 Cervicalgia: Secondary | ICD-10-CM | POA: Diagnosis not present

## 2017-12-05 DIAGNOSIS — R260 Ataxic gait: Secondary | ICD-10-CM | POA: Diagnosis not present

## 2017-12-05 DIAGNOSIS — F060 Psychotic disorder with hallucinations due to known physiological condition: Secondary | ICD-10-CM | POA: Diagnosis not present

## 2017-12-05 DIAGNOSIS — E119 Type 2 diabetes mellitus without complications: Secondary | ICD-10-CM | POA: Diagnosis not present

## 2017-12-05 DIAGNOSIS — M4716 Other spondylosis with myelopathy, lumbar region: Secondary | ICD-10-CM | POA: Diagnosis not present

## 2017-12-05 DIAGNOSIS — M5442 Lumbago with sciatica, left side: Secondary | ICD-10-CM | POA: Diagnosis not present

## 2017-12-05 DIAGNOSIS — G8929 Other chronic pain: Secondary | ICD-10-CM | POA: Diagnosis not present

## 2017-12-05 DIAGNOSIS — M5136 Other intervertebral disc degeneration, lumbar region: Secondary | ICD-10-CM | POA: Diagnosis not present

## 2017-12-06 ENCOUNTER — Other Ambulatory Visit: Payer: Self-pay | Admitting: Family Medicine

## 2017-12-06 ENCOUNTER — Ambulatory Visit (INDEPENDENT_AMBULATORY_CARE_PROVIDER_SITE_OTHER): Payer: Medicare HMO | Admitting: Psychiatry

## 2017-12-06 DIAGNOSIS — F331 Major depressive disorder, recurrent, moderate: Secondary | ICD-10-CM | POA: Diagnosis not present

## 2017-12-06 NOTE — Progress Notes (Signed)
Patient:  [name]                DOB: Oct 26, 1950          MR Number:  [id number]  Location:   [place]:  [address] [place],  [place],    [place]          Start:  Thursday 12/06/2017 1:17 PM  End:       Thursday 12/06/2017 2:05 PM  Provider/Observer:     [name], MSW, LCSW   Chief Complaint:                                                    Depression  Reason For Service:               [name] is a 67 y.o. female who presents with a long standing history of recurrent periods of depression beginning when she was 28 and her favorite uncle died. She h         as been experiencing increased symptoms of depression recently due to stress in the relationship with her son. Per patient's report, he rarely visits her anymore due to his involvement with an older woman who is controlling. She also reports frustration regarding her knees who is her power of attorn   ey and is very demanding perpatient's report. She reports issues with other family members and states she needs help dealing with her family Patient reports multiple psychiatric hospitlaizations due to depression and suicidal ideations with thel last one occuring in 1997. Patient has participated in outpatient psychotherapy and medication management intermittently since age 55.  She currently is seeing psychiatrist Dr. Harrington [name] . Prior to this, she was being seen at [facility]. Patient also has had ECT at [facility].    Interventions Strategy:  Supportive  Participation Level:   Active      Participation Quality:  Appropriate      Behavioral Observation:  Casual, well groomed, speech very slow,   Current Psychosocial Factors: Tension in relationship with son, multiple health issues, concerns about grandson,   Content of Session:   reviewed symptoms, discussed stressors, facilitated expression of thoughts and feelings, validated feelings, assisted patient identify, challenge, and  replace unhelpful thoughts about using walker with helpful thoughts, assign patient to practice replacing negative thoughts about walker with helpful thoughts  Current Status:   Fluctuations in mood, worry,     Suicidal/Homicidal:    No   Patient Progress:   Patient last was seen about 2 months ago. She reports falling several times since last session. Per patient's report, her doctor will place her in her nursing home if she falls again. Patient expresses worry about this and is adamant she does not want to go to nursing home. She has a new walker but has negative thoughts about using it. She reports less worry about son and grandson. She reports continued strong support from her church family.     Target Goals:   1.  Learn and implement behavioral strategies to overcome depression.    2.  Verbalize an understanding and resolution of current interpersonal problems.   Last Reviewed:   10/13/2016  Goals Addressed  Today:    1,2  Plan:      Return again in 2 - 3 weeks.  Impression/Diagnosis:   Patient presents with long standing history of recurrent periods  of depression beginning when she was [age] and her favorite uncle died. Patient reports multiple psychiatric hospitlaizations due to depression and suicidal ideations with thel last one occuring in 1997. Patient has participated in outpatient psychotherapy and medication management intermittently since age 77.  She currently is seeing psychiatrist Dr. Harrington [name] . Prior to this, she was being seen at [facility]. Patient also has had ECT at [facility]. Symptoms have worsened in recent months due to family stress and have  included depressed mood, anxiety, excessive worry, and tearfulness.   Diagnosis:                                Axis I: MDD recurrent episode, moderate          Axis II: Deferred     [name] Fullenwider, LCSW 12/06/2017

## 2017-12-07 ENCOUNTER — Other Ambulatory Visit: Payer: Self-pay

## 2017-12-07 IMAGING — CT CT ABD-PELV W/ CM
2 of 5 series · 17 of 46 positions shown, 19 images · IV contrast (Isovue)
Comparison: 06/02/2014

CLINICAL DATA: Right lower quadrant pain. Diabetes. Constipation.
COPD. irritable bowel syndrome. Gastroesophageal reflux disease.
Pancreatitis.

EXAM:
CT ABDOMEN AND PELVIS WITH CONTRAST
TECHNIQUE: Multidetector CT imaging of the abdomen and pelvis was performed
using the standard protocol following bolus administration of
intravenous contrast.
CONTRAST:  100mL [id number] IOPAMIDOL ([id number]) INJECTION 61%

[Series 2: axial st · axial · 0.80mm/px · z∈[-436,-32]mm · 14 of 91 slices shown, 16 images]
[im 5/91  soft-tissue]
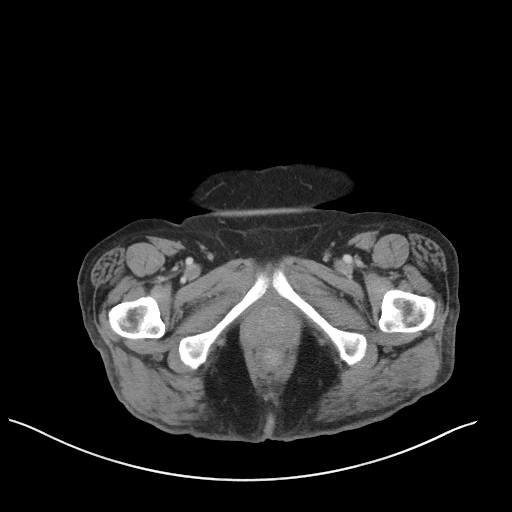
[im 5/91  bone]
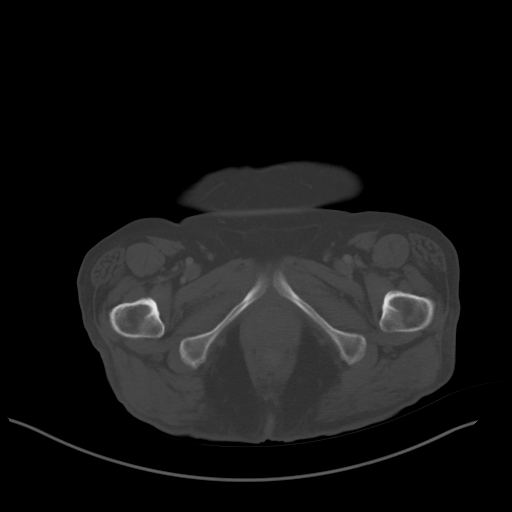
[im 10/91  soft-tissue]
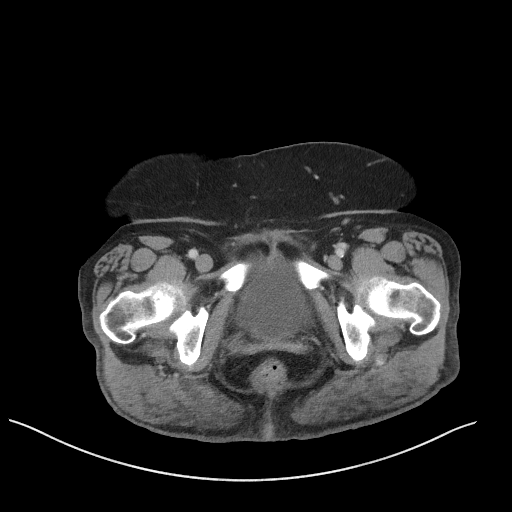
[im 19/91  soft-tissue]
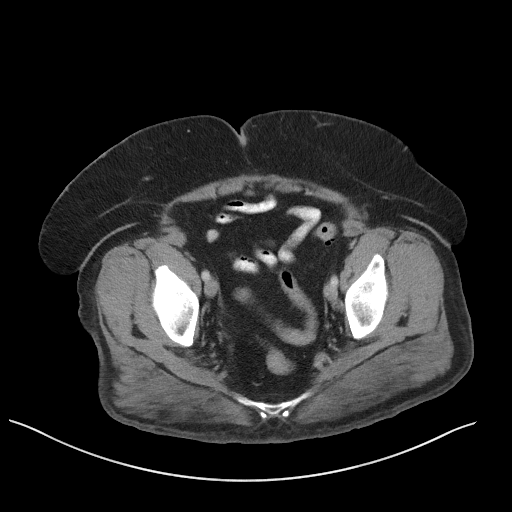
[im 24/91  soft-tissue]
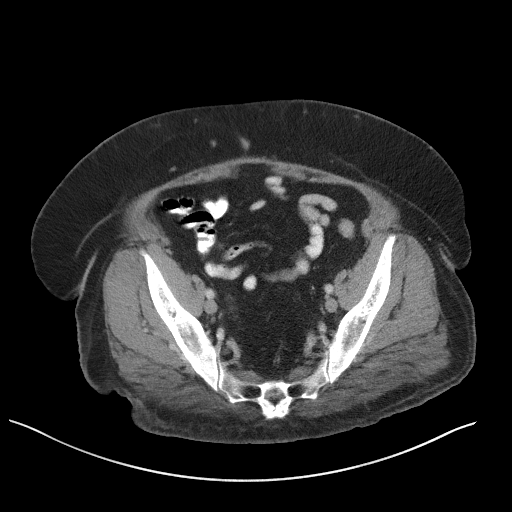
[im 29/91  soft-tissue]
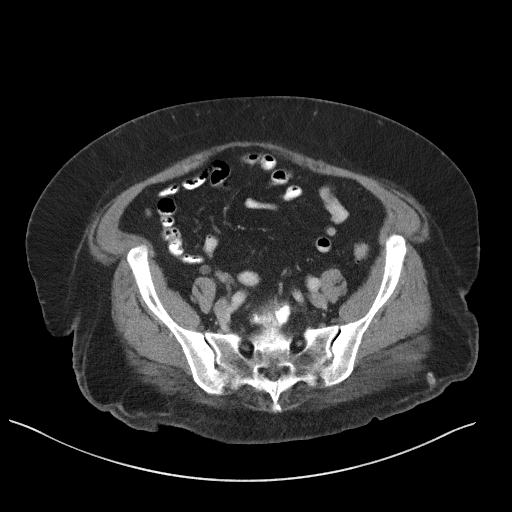
[im 38/91  soft-tissue]
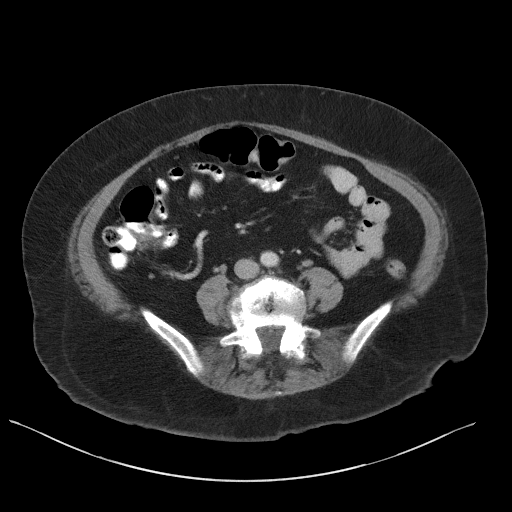
[im 43/91  soft-tissue]
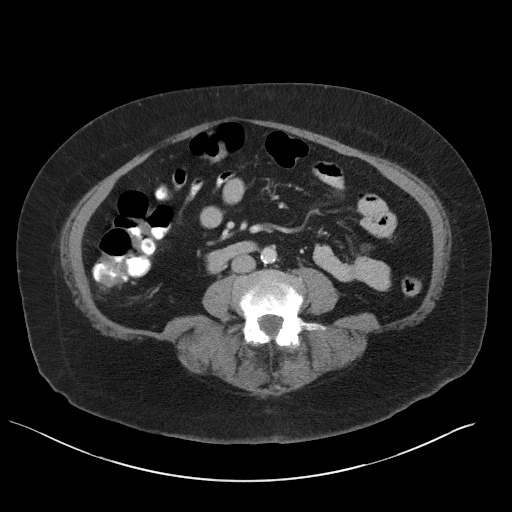
[im 48/91  soft-tissue]
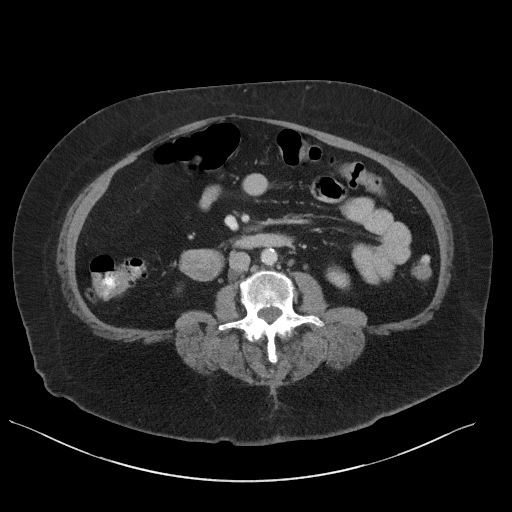
[im 53/91  soft-tissue]
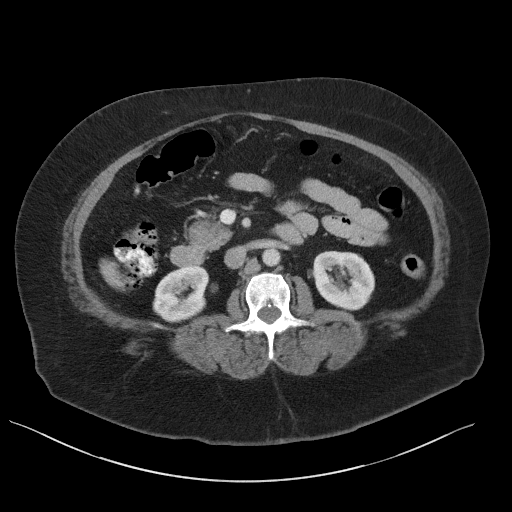
[im 53/91  bone]
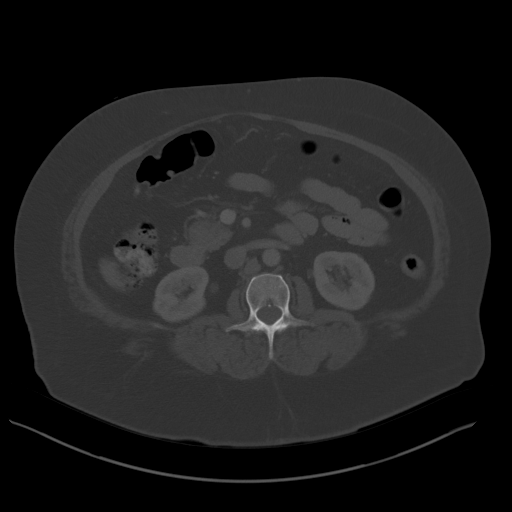
[im 62/91  soft-tissue]
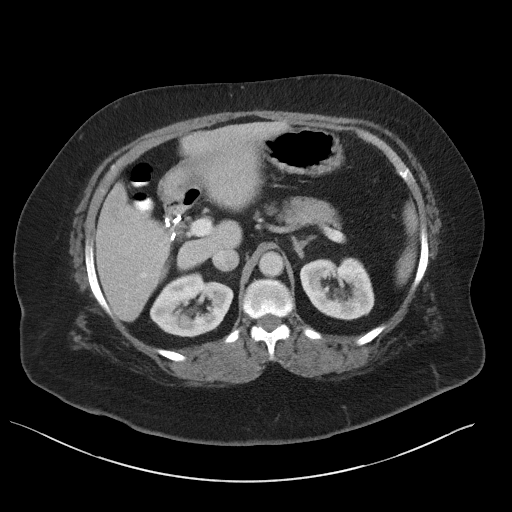
[im 67/91  soft-tissue]
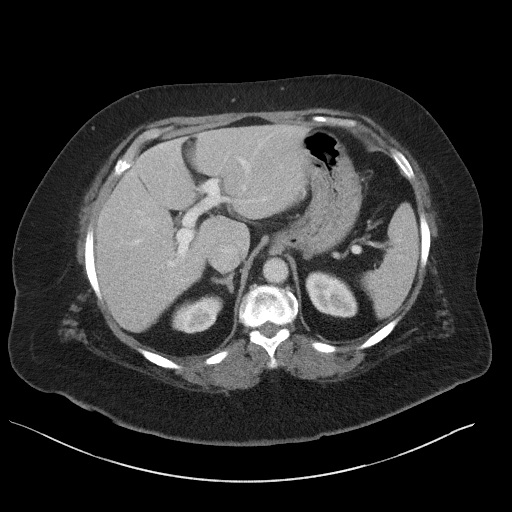
[im 72/91  soft-tissue]
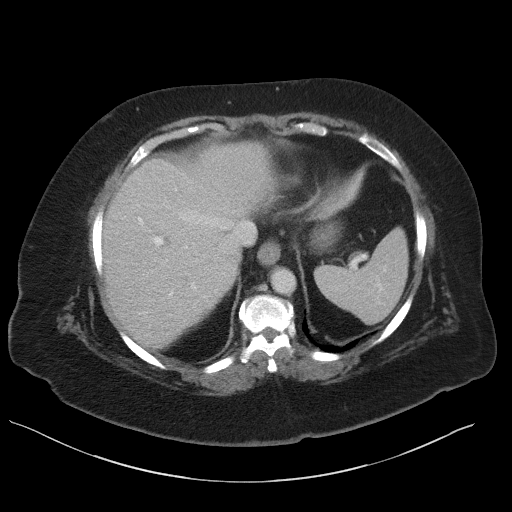
[im 81/91  soft-tissue]
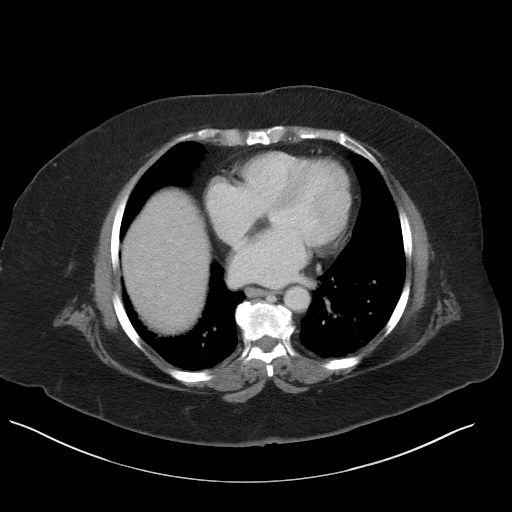
[im 86/91  soft-tissue]
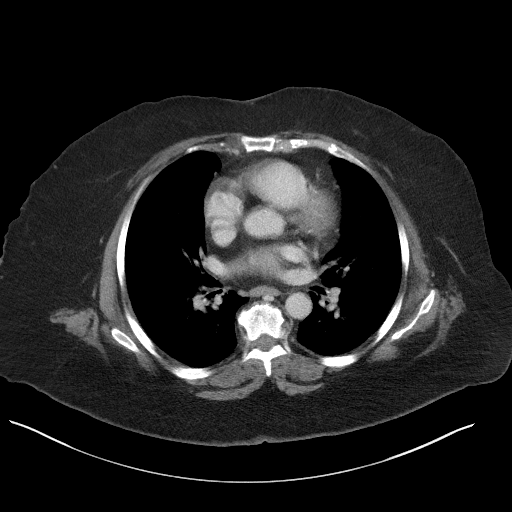

[Series 5: coronal st · coronal · 0.79mm/px · 3 of 114 slices shown]
[im 38/114  soft-tissue]
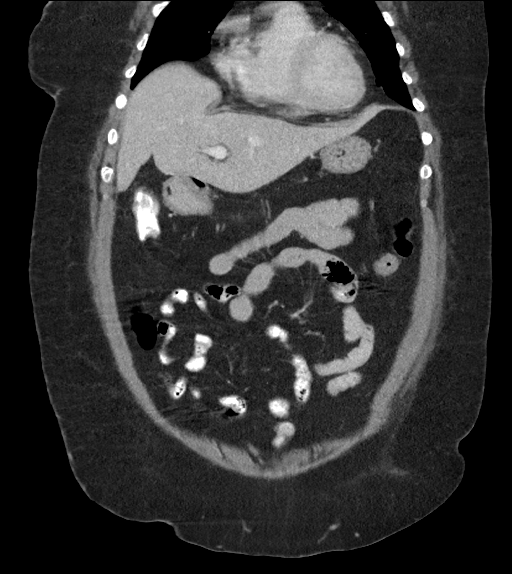
[im 51/114  soft-tissue]
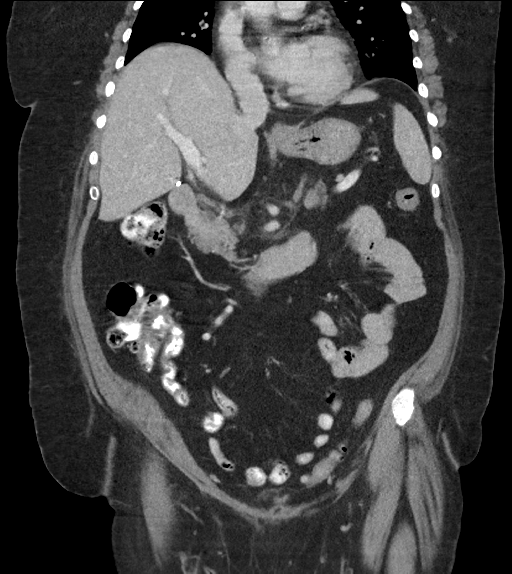
[im 63/114  soft-tissue]
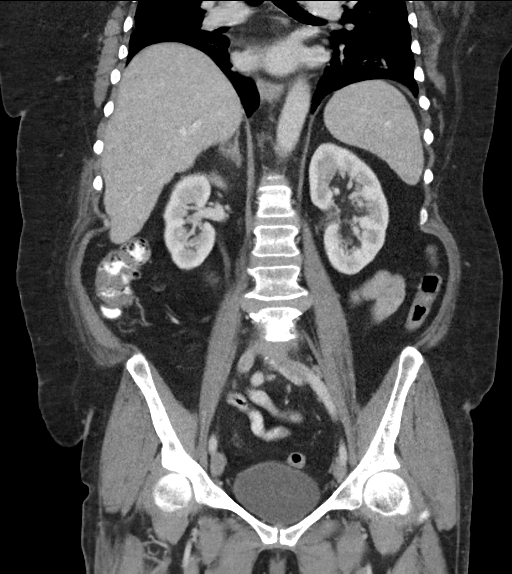

[17 of 46 positions shown; findings below may reference images not displayed]

FINDINGS: Lower chest: Clear lung bases. Mild cardiomegaly. Multivessel
coronary artery atherosclerosis.

Hepatobiliary: Normal liver. Cholecystectomy, without biliary ductal
dilatation.

Pancreas: Normal, without mass or ductal dilatation.

Spleen: Normal in size, without focal abnormality.

Adrenals/Urinary Tract: Normal adrenal glands. Normal kidneys,
without hydronephrosis. Normal urinary bladder.

Stomach/Bowel: Normal stomach, without wall thickening. Scattered
colonic diverticula. Normal terminal ileum. Normal appendix,
including on image 60/series 2.

Normal small bowel.

Vascular/Lymphatic: Aortic and branch vessel atherosclerosis. No
abdominopelvic adenopathy.

Reproductive: Hysterectomy.  No adnexal mass.

Other: No significant free fluid.  Mild pelvic floor laxity.

Musculoskeletal: Lumbosacral spondylosis
IMPRESSION: 1.  No acute process in the abdomen or pelvis.
2.  Coronary artery atherosclerosis. Aortic atherosclerosis.
3. Pelvic floor laxity.

## 2017-12-07 NOTE — Patient Outreach (Signed)
[facility]) Care Management  12/07/2017   [name] 08-22-1950 [id number]  Subjective: Telephone call placed to the patient for assessment. HIPAA verified.  The patient states that she is doing fair.  She states that she has chronic pain that she rates at a 6/10.  She states that her pain is from her back, head and legs.  She states that she had a fall and went to the emergency room in August. She was dehydrated and legs were weak.  She had an MRI done and the pain and weakness comes from her lower back but she is unable to have surgery.  She states that she was sent to pain management. Discussed with the patient about fall precautions and staying hydrated.  She verbalized understanding. She states that she has physical therapy coming out to work on her legs and lower back.  She has aids that come to the home. The aids come six hours Monday -Friday and  Saturday and Sunday for two hours. She states that her blood pressures have been ranging in the 160's.  She has been monitoring her diet for salt.  She takes her medications as prescribed.  A nurse comes to the home to fill her pill box. She states that she received her flu shot in September and has an appointment with her physician in November.  Current Medications:  Current Outpatient Medications  Medication Sig Dispense Refill  . ACCU-CHEK SMARTVIEW test strip TEST BLOOD SUGAR FOUR TIMES DAILY AS DIRECTED 350 each 2  . albuterol (PROVENTIL HFA;VENTOLIN HFA) 108 (90 Base) MCG/ACT inhaler Inhale 1-2 puffs every 6 hours as needed for wheezing, shortness of breath 3 Inhaler 3  . amLODipine (NORVASC) 5 MG tablet Take 1 tablet (5 mg total) by mouth daily. 30 tablet 1  . ascorbic acid (VITAMIN C) 500 MG tablet Take 500 mg by mouth daily.    [name] aspirin EC 81 MG tablet Take 81 mg by mouth daily.    . cetirizine (ZYRTEC) 10 MG tablet Take 1 tablet (10 mg total) by mouth daily. 7 tablet 0  . Cholecalciferol (VITAMIN D3) 5000 units CAPS  Take 1 capsule by mouth daily.    . cloNIDine (CATAPRES) 0.3 MG tablet Take 0.3 mg by mouth 3 (three) times daily.    . diclofenac sodium (VOLTAREN) 1 % GEL Apply 2 g topically daily as needed (pain). Rubs "wherever there is pain" when she doesn't take her pain pill. 100 g 0  . dicyclomine (BENTYL) 10 MG capsule Take 1 tab by mouth twice daily as needed for abdominal cramping. 60 capsule 6  . docusate sodium (COLACE) 100 MG capsule Take 100 mg by mouth 2 (two) times daily.    . hydroxychloroquine (PLAQUENIL) 200 MG tablet Take 200 mg by mouth daily.     . insulin aspart (NOVOLOG FLEXPEN) 100 UNIT/ML FlexPen INJECT 10 TO 14 UNITS INTO THE SKIN 3 (THREE) TIMES DAILY WITH MEALS. 45 mL 3  . Insulin Glargine (TOUJEO SOLOSTAR) 300 UNIT/ML SOPN Inject 20 Units into the skin at bedtime. 9 pen 3  . Insulin Pen Needle (BD PEN NEEDLE NANO U/F) 32G X 4 MM MISC USE FOUR TIMES DAILY 400 each 0  . lamoTRIgine (LAMICTAL) 100 MG tablet Take 1 tablet (100 mg total) by mouth 2 (two) times daily. 180 tablet 3  . levothyroxine (SYNTHROID, LEVOTHROID) 50 MCG tablet TAKE 1 TABLET BY MOUTH ONCE DAILY AND 1/2 TABLET ON SUNDAYS. 30 tablet 5  . lidocaine (XYLOCAINE) 2 %  jelly APPLY TO LEFT ABDOMINAL WALL TWICE DAILY. 30 mL 0  . LORazepam (ATIVAN) 0.5 MG tablet Take 1 tablet (0.5 mg total) by mouth 2 (two) times daily. 180 tablet 2  . losartan (COZAAR) 50 MG tablet Take 1 tablet (50 mg total) by mouth daily. 90 tablet 1  . meclizine (ANTIVERT) 25 MG tablet Take 25 mg by mouth 3 (three) times daily as needed for dizziness. 07/10/17 Has medication and reports not taking often,  but says she takes if dizzy.    . metoprolol tartrate (LOPRESSOR) 50 MG tablet TAKE 1 TABLET TWICE DAILY (NEED MD APPOINTMENT) 60 tablet 0  . montelukast (SINGULAIR) 10 MG tablet TAKE 1 TABLET EVERY DAY 90 tablet 1  . Multiple Vitamins-Minerals (HM MULTIVITAMIN ADULT GUMMY PO) Take 1 tablet by mouth daily.    [name] nystatin (NYAMYC) powder APPLY TO AFFECTED  AREA 4 TIMES DAILY. 30 g 2  . oxyCODONE-acetaminophen (PERCOCET/ROXICET) 5-325 MG tablet Take 1 tablet by mouth every 6 (six) hours as needed.     . polyethylene glycol (MIRALAX) packet Take 17 g by mouth daily. 14 each 0  . pregabalin (LYRICA) 75 MG capsule Take 1 capsule (75 mg total) by mouth daily.    . RABEprazole (ACIPHEX) 20 MG tablet Take 1 tablet (20 mg total) by mouth 2 (two) times daily. 180 tablet 3  . RESTASIS 0.05 % ophthalmic emulsion Place 1 drop into both eyes 2 (two) times daily.     . risperiDONE (RISPERDAL) 0.5 MG tablet Take 1 tablet (0.5 mg total) by mouth at bedtime. 90 tablet 2  . rosuvastatin (CRESTOR) 5 MG tablet Take 1 tablet (5 mg total) by mouth at bedtime. 90 tablet 1  . sertraline (ZOLOFT) 100 MG tablet Take 1 tablet (100 mg total) by mouth daily. 90 tablet 2  . traZODone (DESYREL) 50 MG tablet Take 1 tablet (50 mg total) by mouth at bedtime. 90 tablet 2  . triamcinolone ointment (KENALOG) 0.1 % Apply 1 application topically at bedtime. Apply to elbows at bedtime.    [name] UNABLE TO FIND Transport Chair x 1  Dx-bilateral leg weakness, frequent falls 1 each 0  . UNABLE TO FIND Rolling walker with seat x 1  DX. Bilateral lower extremity weakness, frequent falls, instability 1 each 0   No current facility-administered medications for this visit.     Functional Status:  In your present state of health, do you have any difficulty performing the following activities: 10/30/2017 10/28/2017  Hearing? - N  Vision? - N  Difficulty concentrating or making decisions? - N  Walking or climbing stairs? - Y  Dressing or bathing? - N  Doing errands, shopping? N N  Some recent data might be hidden    Fall/Depression Screening: Fall Risk  12/07/2017 11/20/2017 11/05/2017  Falls in the past year? Yes - No  Number falls in past yr: 1 2 or more 1  Comment bruised - -  Injury with Fall? Yes Yes -  Comment - - -  Risk Factor Category  High Fall Risk - -  Risk for fall due to :  History of fall(s) - -  Follow up Education provided - -   PHQ 2/9 Scores 11/20/2017 10/08/2017 08/08/2017 06/29/2017 06/28/2017 04/30/2017 04/11/2017  PHQ - 2 Score 3 3 2 1 1 1 1   PHQ- 9 Score 6 16 5  - - - -  Some encounter information is confidential and restricted. Go to Review Flowsheets activity to see all data.  Assessment: Patient will continue to benefit from health coach outreach for disease management and support. THN CM Care Plan Problem One     Most Recent Value  THN Long Term Goal   In 90 days the patient will verbalize lowering her blood pressure and no hospital visits  THN Long Term Goal Start Date  12/07/17  Interventions for Problem One Long Term Goal  discussed diet, medications, falls, and monitoring blood pressures     Plan: RN Health Coach will contact patient in the month of December and patient agrees to next outreach.  [name] RN, BSN, [place] Direct Dial:  [phone]  Fax: [phone]

## 2017-12-10 ENCOUNTER — Ambulatory Visit (INDEPENDENT_AMBULATORY_CARE_PROVIDER_SITE_OTHER): Payer: Medicare HMO | Admitting: Otolaryngology

## 2017-12-10 ENCOUNTER — Other Ambulatory Visit: Payer: Self-pay | Admitting: Family Medicine

## 2017-12-10 DIAGNOSIS — R040 Epistaxis: Secondary | ICD-10-CM

## 2017-12-10 DIAGNOSIS — M5442 Lumbago with sciatica, left side: Secondary | ICD-10-CM | POA: Diagnosis not present

## 2017-12-10 DIAGNOSIS — M4716 Other spondylosis with myelopathy, lumbar region: Secondary | ICD-10-CM | POA: Diagnosis not present

## 2017-12-10 DIAGNOSIS — M5136 Other intervertebral disc degeneration, lumbar region: Secondary | ICD-10-CM | POA: Diagnosis not present

## 2017-12-10 DIAGNOSIS — I639 Cerebral infarction, unspecified: Secondary | ICD-10-CM | POA: Diagnosis not present

## 2017-12-10 DIAGNOSIS — G8929 Other chronic pain: Secondary | ICD-10-CM | POA: Diagnosis not present

## 2017-12-10 DIAGNOSIS — F060 Psychotic disorder with hallucinations due to known physiological condition: Secondary | ICD-10-CM | POA: Diagnosis not present

## 2017-12-10 DIAGNOSIS — E119 Type 2 diabetes mellitus without complications: Secondary | ICD-10-CM | POA: Diagnosis not present

## 2017-12-10 DIAGNOSIS — M150 Primary generalized (osteo)arthritis: Secondary | ICD-10-CM | POA: Diagnosis not present

## 2017-12-10 DIAGNOSIS — Z794 Long term (current) use of insulin: Secondary | ICD-10-CM | POA: Diagnosis not present

## 2017-12-10 DIAGNOSIS — R260 Ataxic gait: Secondary | ICD-10-CM | POA: Diagnosis not present

## 2017-12-10 DIAGNOSIS — R32 Unspecified urinary incontinence: Secondary | ICD-10-CM | POA: Diagnosis not present

## 2017-12-10 DIAGNOSIS — M542 Cervicalgia: Secondary | ICD-10-CM | POA: Diagnosis not present

## 2017-12-11 DIAGNOSIS — J449 Chronic obstructive pulmonary disease, unspecified: Secondary | ICD-10-CM | POA: Diagnosis not present

## 2017-12-11 DIAGNOSIS — I10 Essential (primary) hypertension: Secondary | ICD-10-CM | POA: Diagnosis not present

## 2017-12-11 IMAGING — MR MR FOOT*L* WO/W CM
4 of 9 series · 13 of 40 positions shown · IV contrast (multihance)
Comparison: Left foot radiographs 05/09/2016

CLINICAL DATA: Ulceration off of the left second toe.

EXAM:
MRI OF THE LEFT FOREFOOT WITHOUT AND WITH CONTRAST
TECHNIQUE: Multiplanar, multisequence MR imaging of the left foot was performed
both before and after administration of intravenous contrast.
CONTRAST:  15mL MULTIHANCE GADOBENATE DIMEGLUMINE 529 MG/ML IV SOLN

[Series 4: T1 · coronal · 3.0mm · 0.18mm/px · 4 of 43 slices shown (1 of 2)]
[im 1/43]
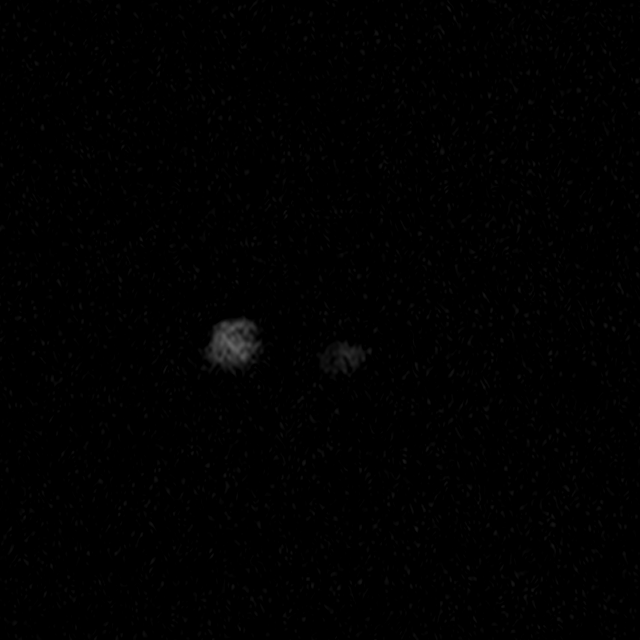
[im 11/43]
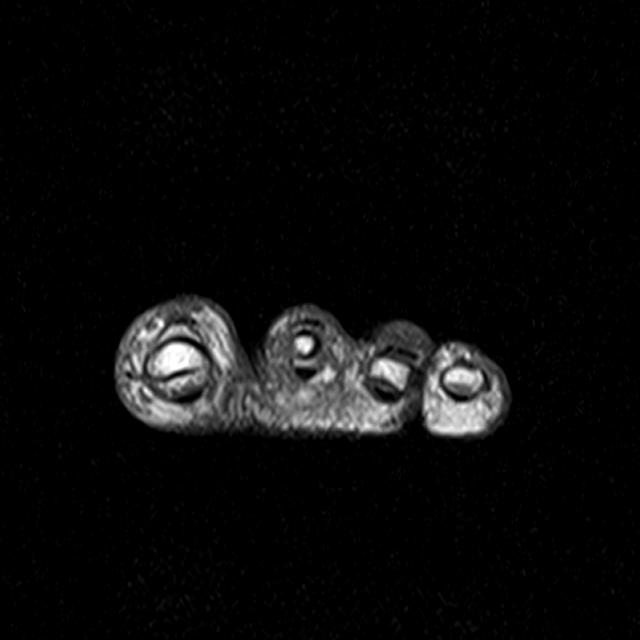
[im 22/43]
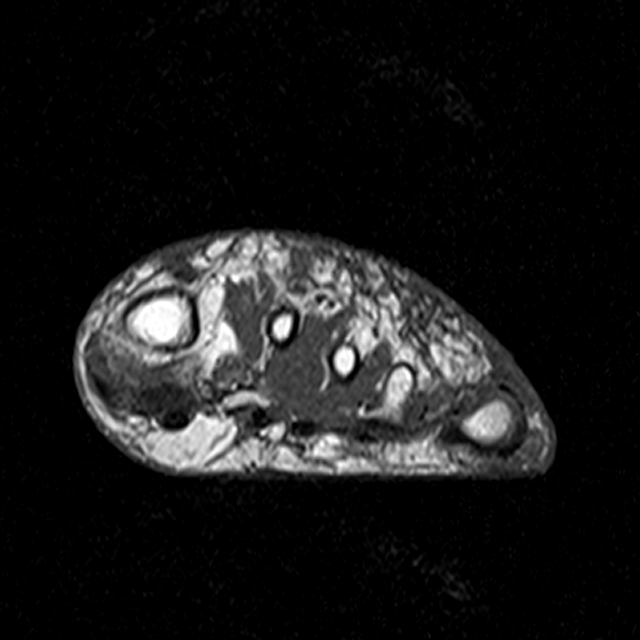
[im 43/43]
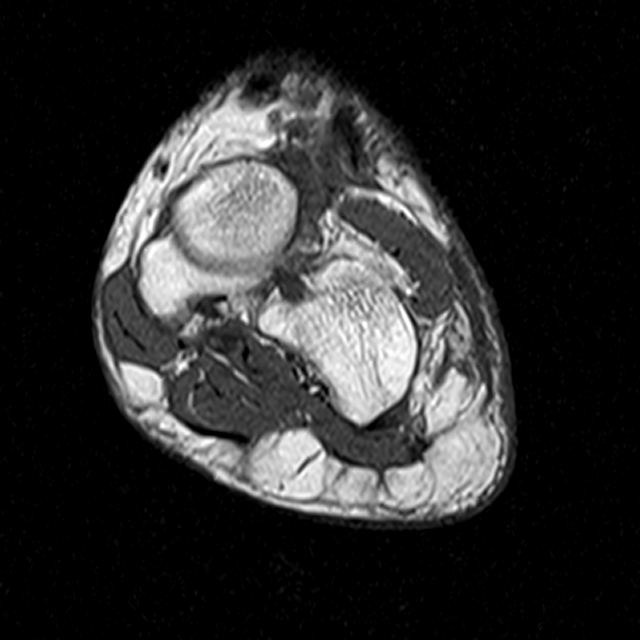

[Series 5: t1fs axial · coronal · 3.0mm · 0.18mm/px · 3 of 43 slices shown]
[im 1/43]
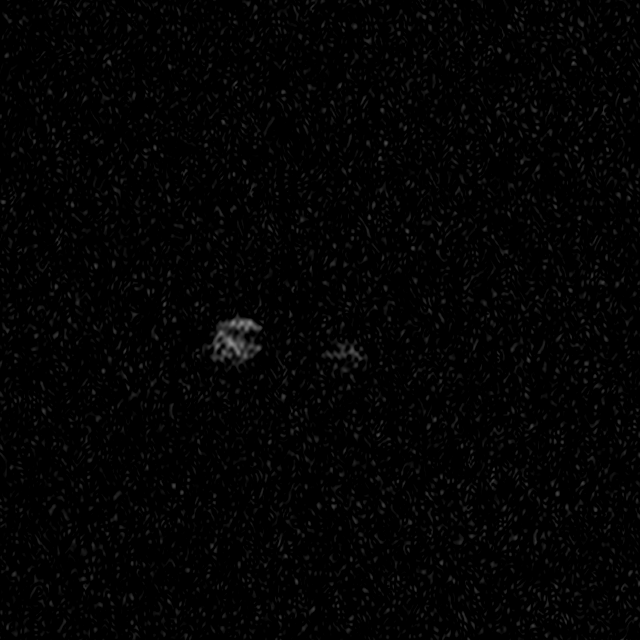
[im 22/43]
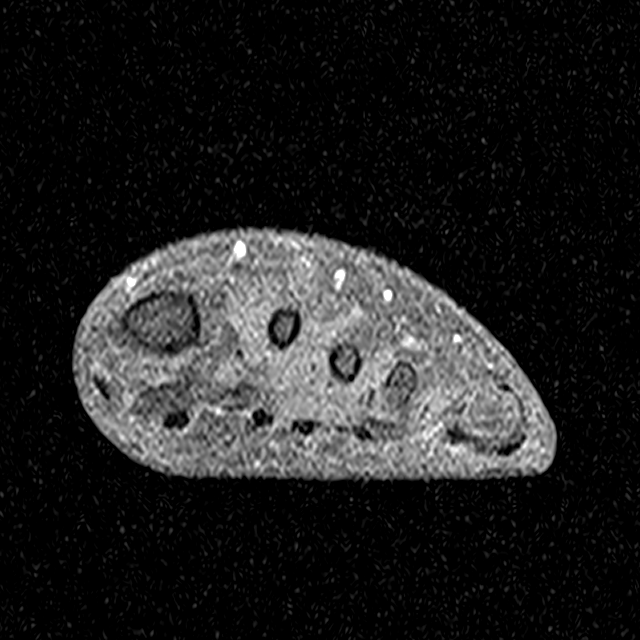
[im 43/43]
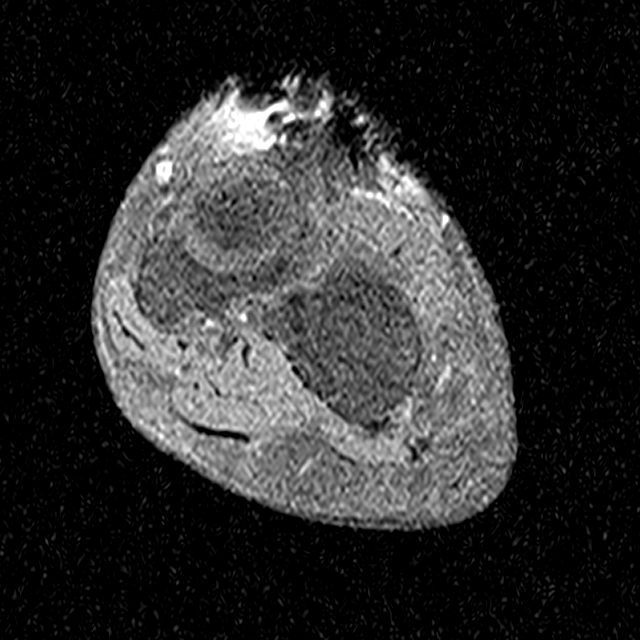

[Series 6: ir sagital · sagittal · 3.0mm · 0.27mm/px · 3 of 25 slices shown]
[im 1/25]
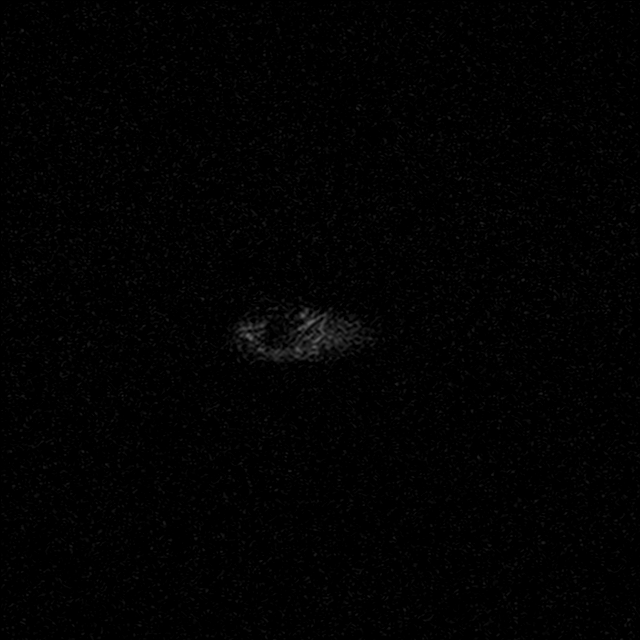
[im 13/25]
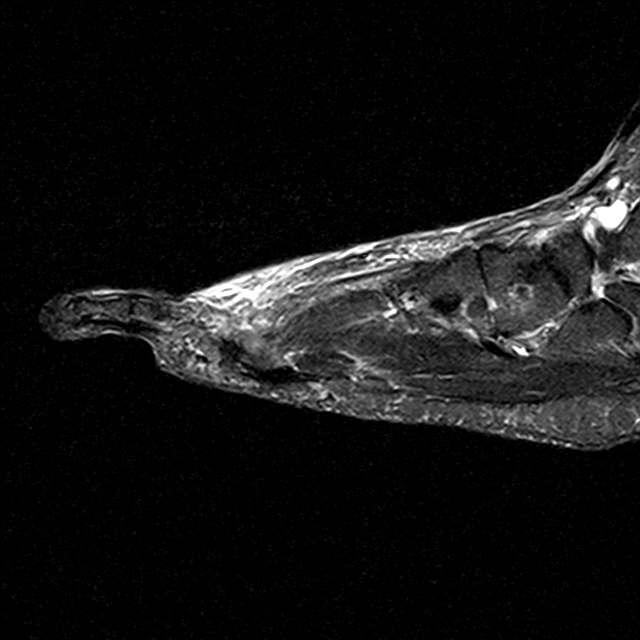
[im 25/25]
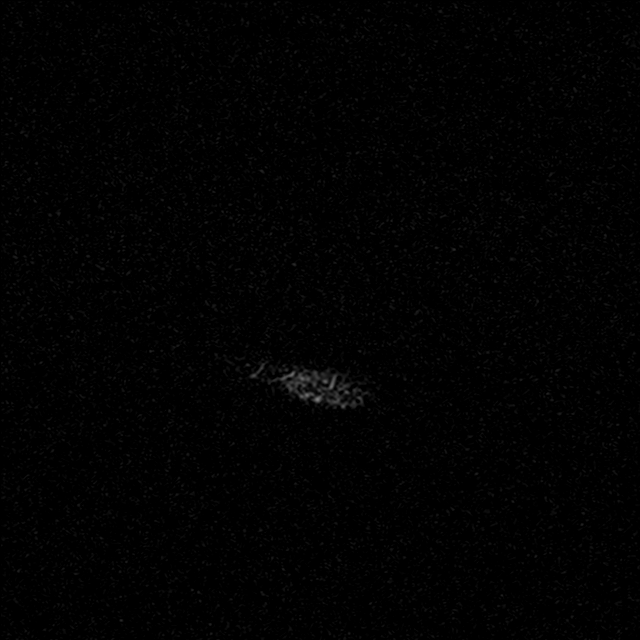

[Series 8: T1 · axial · 3.0mm · 0.27mm/px · z∈[-66,+28]mm · 3 of 28 slices shown (2 of 2)]
[im 1/28]
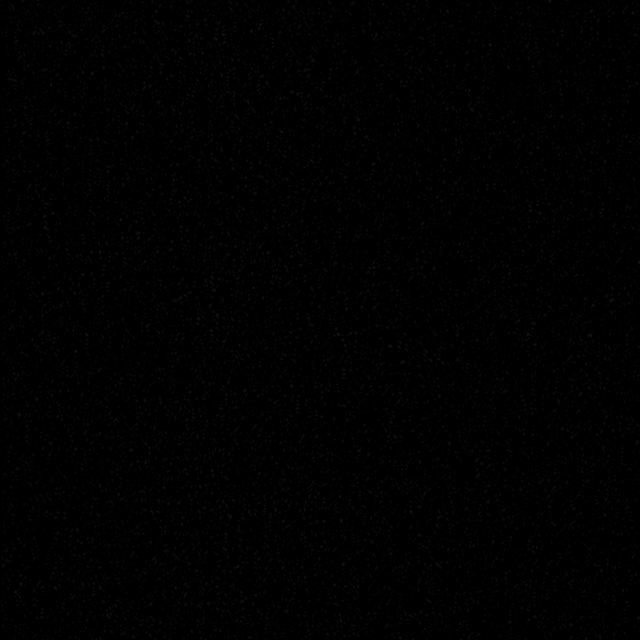
[im 19/28]
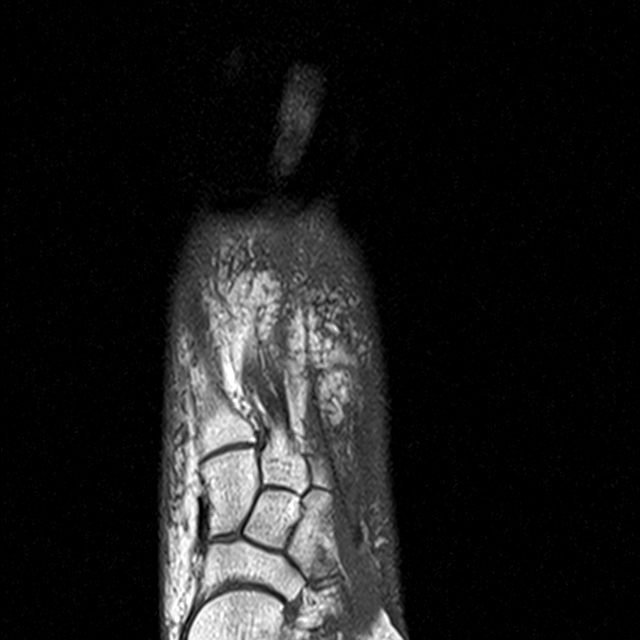
[im 28/28]
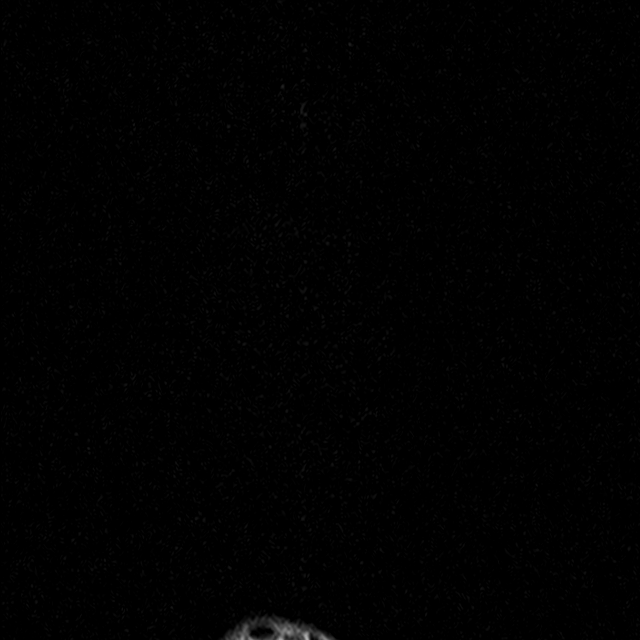

[13 of 40 positions shown; findings below may reference images not displayed]

FINDINGS: There is soft tissue swelling/edema/fluid involving the second toe
and dorsal forefoot. Findings suggest cellulitis. No discrete
drainable soft tissue abscess is identified. No MR findings to
suggest septic arthritis or osteomyelitis. No findings for
myofasciitis or pyomyositis.
IMPRESSION: Cellulitis but no drainable soft tissue abscess.

No myofasciitis or pyomyositis.

No findings for septic arthritis or osteomyelitis.

## 2017-12-12 ENCOUNTER — Ambulatory Visit: Payer: Self-pay | Admitting: Internal Medicine

## 2017-12-13 ENCOUNTER — Other Ambulatory Visit: Payer: Self-pay | Admitting: Internal Medicine

## 2017-12-13 ENCOUNTER — Other Ambulatory Visit: Payer: Self-pay

## 2017-12-13 ENCOUNTER — Telehealth: Payer: Self-pay

## 2017-12-13 DIAGNOSIS — R260 Ataxic gait: Secondary | ICD-10-CM | POA: Diagnosis not present

## 2017-12-13 DIAGNOSIS — M542 Cervicalgia: Secondary | ICD-10-CM | POA: Diagnosis not present

## 2017-12-13 DIAGNOSIS — F060 Psychotic disorder with hallucinations due to known physiological condition: Secondary | ICD-10-CM | POA: Diagnosis not present

## 2017-12-13 DIAGNOSIS — M5442 Lumbago with sciatica, left side: Secondary | ICD-10-CM | POA: Diagnosis not present

## 2017-12-13 DIAGNOSIS — M5136 Other intervertebral disc degeneration, lumbar region: Secondary | ICD-10-CM | POA: Diagnosis not present

## 2017-12-13 DIAGNOSIS — Z794 Long term (current) use of insulin: Secondary | ICD-10-CM | POA: Diagnosis not present

## 2017-12-13 DIAGNOSIS — M4716 Other spondylosis with myelopathy, lumbar region: Secondary | ICD-10-CM | POA: Diagnosis not present

## 2017-12-13 DIAGNOSIS — G8929 Other chronic pain: Secondary | ICD-10-CM | POA: Diagnosis not present

## 2017-12-13 DIAGNOSIS — E119 Type 2 diabetes mellitus without complications: Secondary | ICD-10-CM | POA: Diagnosis not present

## 2017-12-13 NOTE — Telephone Encounter (Signed)
Patient reached [facility] and stated she received a letter from [facility] about the Accu-chek fastclix- she states [employer] is still waiting to hear back from her MD- this is all the message stated

## 2017-12-14 ENCOUNTER — Other Ambulatory Visit: Payer: Self-pay

## 2017-12-14 MED ORDER — ACCU-CHEK FASTCLIX LANCETS MISC: 3 refills | Status: DC

## 2017-12-14 MED ORDER — AMLODIPINE BESYLATE 5 MG PO TABS: 5.0000 mg | ORAL_TABLET | Freq: Every day | ORAL | 1 refills | Status: DC

## 2017-12-14 NOTE — Telephone Encounter (Signed)
RX has been sent.

## 2017-12-18 ENCOUNTER — Telehealth: Payer: Self-pay | Admitting: Internal Medicine

## 2017-12-18 DIAGNOSIS — M542 Cervicalgia: Secondary | ICD-10-CM | POA: Diagnosis not present

## 2017-12-18 DIAGNOSIS — M5442 Lumbago with sciatica, left side: Secondary | ICD-10-CM | POA: Diagnosis not present

## 2017-12-18 DIAGNOSIS — E119 Type 2 diabetes mellitus without complications: Secondary | ICD-10-CM | POA: Diagnosis not present

## 2017-12-18 DIAGNOSIS — M5136 Other intervertebral disc degeneration, lumbar region: Secondary | ICD-10-CM | POA: Diagnosis not present

## 2017-12-18 DIAGNOSIS — F060 Psychotic disorder with hallucinations due to known physiological condition: Secondary | ICD-10-CM | POA: Diagnosis not present

## 2017-12-18 DIAGNOSIS — R260 Ataxic gait: Secondary | ICD-10-CM | POA: Diagnosis not present

## 2017-12-18 DIAGNOSIS — Z794 Long term (current) use of insulin: Secondary | ICD-10-CM | POA: Diagnosis not present

## 2017-12-18 DIAGNOSIS — M4716 Other spondylosis with myelopathy, lumbar region: Secondary | ICD-10-CM | POA: Diagnosis not present

## 2017-12-18 DIAGNOSIS — G8929 Other chronic pain: Secondary | ICD-10-CM | POA: Diagnosis not present

## 2017-12-18 MED ORDER — ACCU-CHEK AVIVA DEVI: 0 refills | Status: DC

## 2017-12-18 NOTE — Telephone Encounter (Signed)
RX sent

## 2017-12-18 NOTE — Telephone Encounter (Signed)
Pharmacy stated patient is requesting an Ava Plus diabetic kit. Please Advise.

## 2017-12-19 ENCOUNTER — Ambulatory Visit (HOSPITAL_COMMUNITY): Payer: Self-pay | Admitting: Psychiatry

## 2017-12-19 ENCOUNTER — Other Ambulatory Visit: Payer: Self-pay | Admitting: Neurology

## 2017-12-20 ENCOUNTER — Telehealth: Payer: Self-pay | Admitting: Family Medicine

## 2017-12-20 NOTE — Telephone Encounter (Signed)
Wanting a referral to [facility] if you agree for a 2nd opinion.

## 2017-12-20 NOTE — Telephone Encounter (Signed)
pls refer as 2nd opinion, thanks

## 2017-12-20 NOTE — Telephone Encounter (Signed)
Patient states blood pressure went down to 90/40

## 2017-12-20 NOTE — Telephone Encounter (Signed)
Spoke with patient and she stated that she was getting ready to check her bp before taking her bp medication. She spoke with [name] earlier and it was 112/48. [name] advised her to recheck it. Upon recheck it was 98/42. Spoke with Dr.[name] and per Dr.[name] advised patient to go to urgent care or closest ED for evaluation. Patient verbalized understanding however she said she had to find a way. She stated her aid suggested she lay back in her chair and rest and recheck it and she would rather do that. Again advised her of Dr.Simpsons recommendations with verbal understanding.

## 2017-12-20 NOTE — Telephone Encounter (Signed)
Patient is requesting 2nd opinion for constant nose bleeds. Blood pressure 112/48. When she stands up she gets a really bad headache.

## 2017-12-21 ENCOUNTER — Other Ambulatory Visit: Payer: Self-pay | Admitting: Family Medicine

## 2017-12-21 DIAGNOSIS — R0989 Other specified symptoms and signs involving the circulatory and respiratory systems: Secondary | ICD-10-CM

## 2017-12-21 DIAGNOSIS — M5136 Other intervertebral disc degeneration, lumbar region: Secondary | ICD-10-CM | POA: Diagnosis not present

## 2017-12-21 DIAGNOSIS — G8929 Other chronic pain: Secondary | ICD-10-CM | POA: Diagnosis not present

## 2017-12-21 DIAGNOSIS — M4716 Other spondylosis with myelopathy, lumbar region: Secondary | ICD-10-CM | POA: Diagnosis not present

## 2017-12-21 DIAGNOSIS — I10 Essential (primary) hypertension: Secondary | ICD-10-CM

## 2017-12-21 DIAGNOSIS — E119 Type 2 diabetes mellitus without complications: Secondary | ICD-10-CM | POA: Diagnosis not present

## 2017-12-21 DIAGNOSIS — M5442 Lumbago with sciatica, left side: Secondary | ICD-10-CM | POA: Diagnosis not present

## 2017-12-21 DIAGNOSIS — R260 Ataxic gait: Secondary | ICD-10-CM | POA: Diagnosis not present

## 2017-12-21 DIAGNOSIS — Z794 Long term (current) use of insulin: Secondary | ICD-10-CM | POA: Diagnosis not present

## 2017-12-21 DIAGNOSIS — M542 Cervicalgia: Secondary | ICD-10-CM | POA: Diagnosis not present

## 2017-12-21 DIAGNOSIS — F060 Psychotic disorder with hallucinations due to known physiological condition: Secondary | ICD-10-CM | POA: Diagnosis not present

## 2017-12-21 NOTE — Progress Notes (Signed)
Card referral re hypertension

## 2017-12-21 NOTE — Telephone Encounter (Signed)
Pt repeatedly calls in with abnormal blood pressures, too high or too low, I am recommending that she return to cardiology for ambulatory blood pressure monitoring, and have entered the referral, please let her know

## 2017-12-24 ENCOUNTER — Ambulatory Visit (INDEPENDENT_AMBULATORY_CARE_PROVIDER_SITE_OTHER): Payer: Medicare HMO | Admitting: Podiatry

## 2017-12-24 ENCOUNTER — Encounter: Payer: Self-pay | Admitting: Podiatry

## 2017-12-24 VITALS — BP 97/48 | HR 68 | Resp 16

## 2017-12-24 DIAGNOSIS — E1142 Type 2 diabetes mellitus with diabetic polyneuropathy: Secondary | ICD-10-CM

## 2017-12-24 DIAGNOSIS — Z9181 History of falling: Secondary | ICD-10-CM

## 2017-12-24 DIAGNOSIS — R260 Ataxic gait: Secondary | ICD-10-CM

## 2017-12-24 DIAGNOSIS — R2681 Unsteadiness on feet: Secondary | ICD-10-CM

## 2017-12-24 DIAGNOSIS — M6281 Muscle weakness (generalized): Secondary | ICD-10-CM

## 2017-12-24 DIAGNOSIS — B351 Tinea unguium: Secondary | ICD-10-CM | POA: Diagnosis not present

## 2017-12-24 NOTE — Progress Notes (Signed)
Subjective:  Patient ID: [name], female    DOB: 1951-01-29,  MRN: [id number]  Chief Complaint  Patient presents with  . debride    BL nail trimming -BFS: 161 A1C: 6.3 PCP: [name] x 3 wks    67 y.o. female presents  for diabetic foot care. Last AMBS was 161. Reports numbness and tingling in their feet. Denies cramping in legs and thighs.  Review of Systems: Negative except as noted in the HPI. Denies N/V/F/Ch.  Past Medical History:  Diagnosis Date  . Anemia   . Anxiety    takes Ativan daily  . Arthritis   . Assistance needed for mobility   . Bipolar disorder ([place])    takes Risperdal nightly  . Blood transfusion   . Cancer ([place])    In her gum  . Carpal tunnel syndrome of right wrist 05/23/2011  . Cervical disc disorder with radiculopathy of cervical region 10/31/2012  . Chronic back pain   . Chronic idiopathic constipation   . Chronic neck and back pain   . Colon polyps   . COPD (chronic obstructive pulmonary disease) with chronic bronchitis ([place]) 09/16/2013   Office Spirometry 10/30/2013-submaximal effort based on appearance of loop and curve. Numbers would fit with severe restriction but her physiologic capability may be better than this. FVC 0.91/44%, and 10.74/45%, FEV1/FVC 0.81, FEF 25-75% 1.43/69%    . Depression    takes Zoloft daily  . Diabetes mellitus    Type II  . Diverticulosis    TCS 9/08 by Dr. Delfin Edis for diarrhea . Bx for micro scopic colitis negative.   . Fibromyalgia   . Frequent falls   . GERD (gastroesophageal reflux disease)    takes Aciphex daily  . Glaucoma    eye drops daily  . Gum symptoms    infection on antibiotic  . Hemiplegia affecting non-dominant side, post-stroke 08/02/2011  . Hyperlipidemia    takes Crestor daily  . Hypertension    takes Amlodipine,Metoprolol,and Clonidine daily  . Hypothyroidism    takes Synthroid daily  . IBS (irritable bowel syndrome)   . Insomnia    takes Trazodone nightly  . Malignant  hyperpyrexia 04/25/2017  . Metabolic encephalopathy 06/19/8117  . Migraines    chronic headaches  . Mononeuritis lower limb   . Narcolepsy   . Osteoporosis   . Pancreatitis 2006   due to Depakote with normal EUS   . Schatzki's ring    non critical / EGD with ED 8/2011with RMR  . Seizures ([place])    takes Lamictal daily.Last seizure 3 yrs ago  . Sleep apnea    on CPAP  . Stroke [facility])    left sided weakness, speech changes  . Tubular adenoma of colon     Current Outpatient Medications:  .  ACCU-CHEK FASTCLIX LANCETS MISC, Use to check blood sugar 3 times a day., Disp: 90 each, Rfl: 3 .  ACCU-CHEK SMARTVIEW test strip, TEST BLOOD SUGAR FOUR TIMES DAILY AS DIRECTED, Disp: 350 each, Rfl: 2 .  albuterol (PROVENTIL HFA;VENTOLIN HFA) 108 (90 Base) MCG/ACT inhaler, Inhale 1-2 puffs every 6 hours as needed for wheezing, shortness of breath, Disp: 3 Inhaler, Rfl: 3 .  amLODipine (NORVASC) 5 MG tablet, Take 1 tablet (5 mg total) by mouth daily., Disp: 90 tablet, Rfl: 1 .  ascorbic acid (VITAMIN C) 500 MG tablet, Take 500 mg by mouth daily., Disp: , Rfl:  .  aspirin EC 81 MG tablet, Take 81 mg by mouth daily.,  Disp: , Rfl:  .  Blood Glucose Monitoring Suppl (ACCU-CHEK AVIVA) device, Use as instructed, Disp: 1 each, Rfl: 0 .  cetirizine (ZYRTEC) 10 MG tablet, Take 1 tablet (10 mg total) by mouth daily., Disp: 7 tablet, Rfl: 0 .  Cholecalciferol (VITAMIN D3) 5000 units CAPS, Take 1 capsule by mouth daily., Disp: , Rfl:  .  cloNIDine (CATAPRES) 0.3 MG tablet, Take 0.3 mg by mouth 3 (three) times daily., Disp: , Rfl:  .  diclofenac sodium (VOLTAREN) 1 % GEL, Apply 2 g topically daily as needed (pain). Rubs "wherever there is pain" when she doesn't take her pain pill., Disp: 100 g, Rfl: 0 .  dicyclomine (BENTYL) 10 MG capsule, Take 1 tab by mouth twice daily as needed for abdominal cramping., Disp: 60 capsule, Rfl: 6 .  docusate sodium (COLACE) 100 MG capsule, Take 100 mg by mouth 2 (two) times daily.,  Disp: , Rfl:  .  hydroxychloroquine (PLAQUENIL) 200 MG tablet, Take 200 mg by mouth daily. , Disp: , Rfl:  .  insulin aspart (NOVOLOG FLEXPEN) 100 UNIT/ML FlexPen, INJECT 10 TO 14 UNITS INTO THE SKIN 3 (THREE) TIMES DAILY WITH MEALS., Disp: 45 mL, Rfl: 3 .  Insulin Glargine (TOUJEO SOLOSTAR) 300 UNIT/ML SOPN, Inject 20 Units into the skin at bedtime., Disp: 9 pen, Rfl: 3 .  Insulin Pen Needle (BD PEN NEEDLE NANO U/F) 32G X 4 MM MISC, USE FOUR TIMES DAILY, Disp: 400 each, Rfl: 0 .  lamoTRIgine (LAMICTAL) 100 MG tablet, TAKE 1 TABLET TWICE DAILY, Disp: 180 tablet, Rfl: 0 .  levothyroxine (SYNTHROID, LEVOTHROID) 50 MCG tablet, TAKE 1 TABLET BY MOUTH ONCE DAILY AND 1/2 TABLET ON SUNDAYS., Disp: 30 tablet, Rfl: 5 .  lidocaine (XYLOCAINE) 2 % jelly, APPLY TO LEFT ABDOMINAL WALL TWICE DAILY., Disp: 30 mL, Rfl: 0 .  LORazepam (ATIVAN) 0.5 MG tablet, Take 1 tablet (0.5 mg total) by mouth 2 (two) times daily., Disp: 180 tablet, Rfl: 2 .  losartan (COZAAR) 50 MG tablet, Take 1 tablet (50 mg total) by mouth daily., Disp: 90 tablet, Rfl: 1 .  meclizine (ANTIVERT) 25 MG tablet, Take 25 mg by mouth 3 (three) times daily as needed for dizziness. 07/10/17 Has medication and reports not taking often,  but says she takes if dizzy., Disp: , Rfl:  .  metoprolol tartrate (LOPRESSOR) 50 MG tablet, TAKE 1 TABLET TWICE DAILY (NEED MD APPOINTMENT), Disp: 60 tablet, Rfl: 0 .  montelukast (SINGULAIR) 10 MG tablet, TAKE 1 TABLET EVERY DAY, Disp: 90 tablet, Rfl: 1 .  Multiple Vitamins-Minerals (HM MULTIVITAMIN ADULT GUMMY PO), Take 1 tablet by mouth daily., Disp: , Rfl:  .  nystatin (NYAMYC) powder, APPLY TO AFFECTED AREA 4 TIMES DAILY., Disp: 30 g, Rfl: 2 .  oxyCODONE-acetaminophen (PERCOCET/ROXICET) 5-325 MG tablet, Take 1 tablet by mouth every 6 (six) hours as needed. , Disp: , Rfl:  .  polyethylene glycol (MIRALAX) packet, Take 17 g by mouth daily., Disp: 14 each, Rfl: 0 .  pregabalin (LYRICA) 75 MG capsule, Take 1 capsule  (75 mg total) by mouth daily., Disp: , Rfl:  .  RABEprazole (ACIPHEX) 20 MG tablet, Take 1 tablet (20 mg total) by mouth 2 (two) times daily., Disp: 180 tablet, Rfl: 3 .  RESTASIS 0.05 % ophthalmic emulsion, Place 1 drop into both eyes 2 (two) times daily. , Disp: , Rfl:  .  risperiDONE (RISPERDAL) 0.5 MG tablet, Take 1 tablet (0.5 mg total) by mouth at bedtime., Disp: 90 tablet, Rfl: 2 .  rosuvastatin (CRESTOR) 5 MG tablet, TAKE 1 TABLET AT BEDTIME, Disp: 90 tablet, Rfl: 1 .  sertraline (ZOLOFT) 100 MG tablet, Take 1 tablet (100 mg total) by mouth daily., Disp: 90 tablet, Rfl: 2 .  traZODone (DESYREL) 50 MG tablet, Take 1 tablet (50 mg total) by mouth at bedtime., Disp: 90 tablet, Rfl: 2 .  triamcinolone ointment (KENALOG) 0.1 %, Apply 1 application topically at bedtime. Apply to elbows at bedtime., Disp: , Rfl:  .  UNABLE TO FIND, Transport Chair x 1  Dx-bilateral leg weakness, frequent falls, Disp: 1 each, Rfl: 0 .  UNABLE TO FIND, Rolling walker with seat x 1  DX. Bilateral lower extremity weakness, frequent falls, instability, Disp: 1 each, Rfl: 0  Social History   Tobacco Use  Smoking Status Current Every Day Smoker  . Packs/day: 0.25  . Years: 7.00  . Pack years: 1.75  . Types: Cigarettes  Smokeless Tobacco Never Used  Tobacco Comment   continues to smoke - a little less than a 1/4 pack per da as of 05/01/2017    Allergies  Allergen Reactions  . Cephalexin Hives  . Iron Nausea And Vomiting  . Milk-Related Compounds Other (See Comments)    Doesn't agree with stomach.   . Penicillins Hives    Has patient had a PCN reaction causing immediate rash, facial/tongue/throat swelling, SOB or lightheadedness with hypotension: Yes Has patient had a PCN reaction causing severe rash involving mucus membranes or skin necrosis: No Has patient had a PCN reaction that required hospitalization No Has patient had a PCN reaction occurring within the last 10 years: No If all of the above answers  are "NO", then may proceed with Cephalosporin use.   [name] Phenazopyridine Hcl Hives         Objective:   Vitals:   12/24/17 1504  BP: (!) 97/48  Pulse: 68  Resp: 16   There is no height or weight on file to calculate BMI. Constitutional Well developed. Well nourished.  Vascular Dorsalis pedis pulses present 1+ bilaterally  Posterior tibial pulses present 1+ bilaterally  Pedal hair growth normal. Capillary refill normal to all digits.  No cyanosis or clubbing noted.  Neurologic Normal speech. Oriented to person, place, and time. Epicritic sensation to light touch grossly present bilaterally. Protective sensation with 5.07 monofilament  absent bilaterally.  Dermatologic Nails elongated, thickened, dystrophic. No open wounds. No skin lesions.  Orthopedic: Normal joint ROM without pain or crepitus bilaterally. No visible deformities. No bony tenderness.   Balance Assessment:  Stay Indepenent Self-Questionnaire: 14 (scores >4 indicative of fall risk) Timed Up and Go: 16 (>12 seconds indicative of fall risk) 30-Second Chair Stand Test: unable to perform (11 normal for age and gender ; below average indicative of fall risk) Full Tandem Stance: unable to perform (<10 seconds indicative of fall risk)   Assessment:   1. DM type 2 with diabetic peripheral neuropathy ([place])   2. Onychomycosis   3. Ataxic gait   4. Muscle weakness (generalized)   5. Unsteadiness on feet   6. At risk for falls    Plan:  Patient was evaluated and treated and all questions answered.  Diabetes with DPN, Onychomycosis -Educated on diabetic footcare. Diabetic risk level 1 -Nails x10 debrided sharply and manually with large nail nipper and rotary burr.   Procedure: Nail Debridement Rationale: Patient meets criteria for routine foot care due to DPN Type of Debridement: manual, sharp debridement. Instrumentation: Nail nipper, rotary burr. Number of Nails: 10  Fall risk -Comprehensive fall risk  assessment performed as above -Discussed importance of maintaining balance and preventing falls.  Patient would benefit from balance bracing.  Will make appointment for fabrication  20 minutes of face to face time were spent with the patient. >50% of this was spent on counseling and coordination of care. Specifically discussed with patient the above diagnoses and overall treatment plan.   Return in about 3 months (around 03/26/2018) for Diabetic Foot Care.

## 2017-12-25 ENCOUNTER — Telehealth: Payer: Self-pay | Admitting: Family Medicine

## 2017-12-25 ENCOUNTER — Other Ambulatory Visit: Payer: Self-pay | Admitting: Family Medicine

## 2017-12-25 ENCOUNTER — Other Ambulatory Visit: Payer: Self-pay

## 2017-12-25 DIAGNOSIS — F060 Psychotic disorder with hallucinations due to known physiological condition: Secondary | ICD-10-CM | POA: Diagnosis not present

## 2017-12-25 DIAGNOSIS — G8929 Other chronic pain: Secondary | ICD-10-CM | POA: Diagnosis not present

## 2017-12-25 DIAGNOSIS — R260 Ataxic gait: Secondary | ICD-10-CM | POA: Diagnosis not present

## 2017-12-25 DIAGNOSIS — E119 Type 2 diabetes mellitus without complications: Secondary | ICD-10-CM | POA: Diagnosis not present

## 2017-12-25 DIAGNOSIS — M5442 Lumbago with sciatica, left side: Secondary | ICD-10-CM | POA: Diagnosis not present

## 2017-12-25 DIAGNOSIS — M542 Cervicalgia: Secondary | ICD-10-CM | POA: Diagnosis not present

## 2017-12-25 DIAGNOSIS — M4716 Other spondylosis with myelopathy, lumbar region: Secondary | ICD-10-CM | POA: Diagnosis not present

## 2017-12-25 DIAGNOSIS — M5136 Other intervertebral disc degeneration, lumbar region: Secondary | ICD-10-CM | POA: Diagnosis not present

## 2017-12-25 DIAGNOSIS — Z794 Long term (current) use of insulin: Secondary | ICD-10-CM | POA: Diagnosis not present

## 2017-12-25 MED ORDER — LOSARTAN POTASSIUM 50 MG PO TABS: 50.0000 mg | ORAL_TABLET | Freq: Every day | ORAL | 1 refills | Status: DC

## 2017-12-25 NOTE — Telephone Encounter (Signed)
Losartan refilled and sent to [facility]

## 2017-12-25 NOTE — Telephone Encounter (Signed)
Please call to [facility] for a refill on Losartan 50 mg.[name]

## 2017-12-27 DIAGNOSIS — M542 Cervicalgia: Secondary | ICD-10-CM | POA: Diagnosis not present

## 2017-12-27 DIAGNOSIS — G8929 Other chronic pain: Secondary | ICD-10-CM

## 2017-12-27 DIAGNOSIS — M7542 Impingement syndrome of left shoulder: Secondary | ICD-10-CM | POA: Diagnosis not present

## 2017-12-27 DIAGNOSIS — M25512 Pain in left shoulder: Secondary | ICD-10-CM | POA: Diagnosis not present

## 2017-12-27 DIAGNOSIS — M5136 Other intervertebral disc degeneration, lumbar region: Secondary | ICD-10-CM | POA: Diagnosis not present

## 2017-12-27 DIAGNOSIS — M4716 Other spondylosis with myelopathy, lumbar region: Secondary | ICD-10-CM | POA: Diagnosis not present

## 2017-12-27 DIAGNOSIS — M5442 Lumbago with sciatica, left side: Secondary | ICD-10-CM | POA: Diagnosis not present

## 2017-12-27 DIAGNOSIS — E119 Type 2 diabetes mellitus without complications: Secondary | ICD-10-CM | POA: Diagnosis not present

## 2017-12-28 NOTE — Telephone Encounter (Signed)
Patient made aware with verbal understanding.

## 2017-12-30 DIAGNOSIS — I69359 Hemiplegia and hemiparesis following cerebral infarction affecting unspecified side: Secondary | ICD-10-CM | POA: Diagnosis not present

## 2017-12-30 DIAGNOSIS — E119 Type 2 diabetes mellitus without complications: Secondary | ICD-10-CM | POA: Diagnosis not present

## 2017-12-30 DIAGNOSIS — Z9181 History of falling: Secondary | ICD-10-CM | POA: Diagnosis not present

## 2017-12-30 DIAGNOSIS — M6281 Muscle weakness (generalized): Secondary | ICD-10-CM | POA: Diagnosis not present

## 2017-12-31 ENCOUNTER — Ambulatory Visit (INDEPENDENT_AMBULATORY_CARE_PROVIDER_SITE_OTHER): Payer: Medicare HMO | Admitting: Family Medicine

## 2017-12-31 ENCOUNTER — Encounter: Payer: Self-pay | Admitting: Family Medicine

## 2017-12-31 VITALS — BP 102/54 | HR 64 | Resp 16 | Ht 59.0 in | Wt 157.0 lb

## 2017-12-31 DIAGNOSIS — Z794 Long term (current) use of insulin: Secondary | ICD-10-CM | POA: Diagnosis not present

## 2017-12-31 DIAGNOSIS — G8929 Other chronic pain: Secondary | ICD-10-CM | POA: Diagnosis not present

## 2017-12-31 DIAGNOSIS — M542 Cervicalgia: Secondary | ICD-10-CM | POA: Diagnosis not present

## 2017-12-31 DIAGNOSIS — E1165 Type 2 diabetes mellitus with hyperglycemia: Secondary | ICD-10-CM | POA: Diagnosis not present

## 2017-12-31 DIAGNOSIS — M5442 Lumbago with sciatica, left side: Secondary | ICD-10-CM | POA: Diagnosis not present

## 2017-12-31 DIAGNOSIS — D1720 Benign lipomatous neoplasm of skin and subcutaneous tissue of unspecified limb: Secondary | ICD-10-CM | POA: Insufficient documentation

## 2017-12-31 DIAGNOSIS — E119 Type 2 diabetes mellitus without complications: Secondary | ICD-10-CM | POA: Diagnosis not present

## 2017-12-31 DIAGNOSIS — F060 Psychotic disorder with hallucinations due to known physiological condition: Secondary | ICD-10-CM

## 2017-12-31 DIAGNOSIS — R0989 Other specified symptoms and signs involving the circulatory and respiratory systems: Secondary | ICD-10-CM | POA: Diagnosis not present

## 2017-12-31 DIAGNOSIS — M4716 Other spondylosis with myelopathy, lumbar region: Secondary | ICD-10-CM | POA: Diagnosis not present

## 2017-12-31 DIAGNOSIS — M5136 Other intervertebral disc degeneration, lumbar region: Secondary | ICD-10-CM | POA: Diagnosis not present

## 2017-12-31 DIAGNOSIS — R260 Ataxic gait: Secondary | ICD-10-CM | POA: Diagnosis not present

## 2017-12-31 DIAGNOSIS — F17210 Nicotine dependence, cigarettes, uncomplicated: Secondary | ICD-10-CM | POA: Diagnosis not present

## 2017-12-31 DIAGNOSIS — D1779 Benign lipomatous neoplasm of other sites: Secondary | ICD-10-CM

## 2017-12-31 MED ORDER — CLONIDINE HCL 0.3 MG PO TABS: ORAL_TABLET | ORAL | 1 refills | Status: DC

## 2017-12-31 MED ORDER — NICOTINE 10 MG IN INHA: RESPIRATORY_TRACT | 1 refills | Status: DC

## 2017-12-31 NOTE — Assessment & Plan Note (Addendum)
Over 1 year h/o painless swellings on left thigh, medial and lateral aspects, no redness , drainage or erythema, pt reassured

## 2017-12-31 NOTE — Patient Instructions (Addendum)
Please schedule wellness with nurse at checkout. Pt requesting same day as physical if this is possible  Will try to get nicotrol inhaler instead of patch since falls off  Plan to QUIT smoking   Reduce clonidine to only on at bedtime   Steps to Quit Smoking Smoking tobacco can be bad for your health. It can also affect almost every organ in your body. Smoking puts you and people around you at risk for many serious long-lasting (chronic) diseases. Quitting smoking is hard, but it is one of the best things that you can do for your health. It is never too late to quit. What are the benefits of quitting smoking? When you quit smoking, you lower your risk for getting serious diseases and conditions. They can include:  Lung cancer or lung disease.  Heart disease.  Stroke.  Heart attack.  Not being able to have children (infertility).  Weak bones (osteoporosis) and broken bones (fractures).  If you have coughing, wheezing, and shortness of breath, those symptoms may get better when you quit. You may also get sick less often. If you are pregnant, quitting smoking can help to lower your chances of having a baby of low birth weight. What can I do to help me quit smoking? Talk with your doctor about what can help you quit smoking. Some things you can do (strategies) include:  Quitting smoking totally, instead of slowly cutting back how much you smoke over a period of time.  Going to in-person counseling. You are more likely to quit if you go to many counseling sessions.  Using resources and support systems, such as: ? Database administrator with a Social worker. ? Phone quitlines. ? Careers information officer. ? Support groups or group counseling. ? Text messaging programs. ? Mobile phone apps or applications.  Taking medicines. Some of these medicines may have nicotine in them. If you are pregnant or breastfeeding, do not take any medicines to quit smoking unless your doctor says it is okay. Talk  with your doctor about counseling or other things that can help you.  Talk with your doctor about using more than one strategy at the same time, such as taking medicines while you are also going to in-person counseling. This can help make quitting easier. What things can I do to make it easier to quit? Quitting smoking might feel very hard at first, but there is a lot that you can do to make it easier. Take these steps:  Talk to your family and friends. Ask them to support and encourage you.  Call phone quitlines, reach out to support groups, or work with a Social worker.  Ask people who smoke to not smoke around you.  Avoid places that make you want (trigger) to smoke, such as: ? Bars. ? Parties. ? Smoke-break areas at work.  Spend time with people who do not smoke.  Lower the stress in your life. Stress can make you want to smoke. Try these things to help your stress: ? Getting regular exercise. ? Deep-breathing exercises. ? Yoga. ? Meditating. ? Doing a body scan. To do this, close your eyes, focus on one area of your body at a time from head to toe, and notice which parts of your body are tense. Try to relax the muscles in those areas.  Download or buy apps on your mobile phone or tablet that can help you stick to your quit plan. There are many free apps, such as QuitGuide from the [employer] Office manager for Disease Control  and Prevention). You can find more support from smokefree.gov and other websites.  This information is not intended to replace advice given to you by your health care provider. Make sure you discuss any questions you have with your health care provider. Document Released: 12/24/2008 Document Revised: 10/26/2015 Document Reviewed: 07/14/2014 Elsevier Interactive Patient Education  2018 Elsevier Inc.  

## 2017-12-31 NOTE — Progress Notes (Signed)
[name]     MRN: [id number]      DOB: 11/13/1950   HPI [name] is here for follow up and re-evaluation of chronic medical conditions, medication management and review of any available recent lab and radiology data.  Preventive health is updated, specifically  Cancer screening and Immunization.   Questions or concerns regarding consultations or procedures which the PT has had in the interim are  addressed. The PT denies any adverse reactions to current medications since the last visit.  2  painless swellings on left thigh, inner and outer for over 9 months Also c/o fluctuating blood pressure, not higher than 160's   ROS Denies recent fever or chills. Denies sinus pressure, nasal congestion, ear pain or sore throat. Denies chest congestion, productive cough or wheezing. Denies chest pains, palpitations and leg swelling Denies abdominal pain, nausea, vomiting,diarrhea or constipation.   Denies dysuria, frequency, hesitancy or incontinence. C/o chronic joint pain,  and limitation in mobility. Denies headaches, seizures, numbness, or tingling. Denies uncontrolled depression, anxiety c/o  insomnia. Denies skin break down or rash.   PE  BP (!) 102/54   Pulse 64   Resp 16   Ht 4\' 11"  (1.499 m)   Wt 157 lb (71.2 kg)   SpO2 100%   BMI 31.71 kg/m   Patient alert and oriented and in no cardiopulmonary distress.  HEENT: No facial asymmetry, EOMI,   oropharynx pink and moist.  Neck supple no JVD, no mass.  Chest: Clear to auscultation bilaterally.decreased air entry  CVS: S1, S2 no murmurs, no S3.Regular rate.  ABD: Soft non tender.   Ext: No edema  MS: Adequate ROM spine, shoulders, hips and knees.  Skin: Intact, no ulcerations or rash noted.Lipomas x 2 on left thigh, non tender, nor warm or red, no drainage  Psych: Good eye contact, normal affect. Memory intact not anxious or depressed appearing.  CNS: CN 2-12 intact, power,  normal throughout.no focal deficits  noted.   Assessment & Plan  Lipoma of extremity Over 1 year h/o painless swellings on left thigh, medial and lateral aspects, no redness , drainage or erythema, pt reassured  Light cigarette smoker (1-9 cigarettes per day) Asked: currently smokes 10/day cigarettes [place]" wants to quit but states nervous/ anxious so unable Advise: needs to quit to reduce stroke, heart disease and cancer risk, has had gum cancer Assist: counseled for approx 5 mins, call QUIT NOW numbers, has patches, wants inhaler states patch falls off Arrange: f/u in 6 weeks  Essential hypertension Over corrected, stop am clonidine and only take omn at night DASH diet and commitment to daily physical activity for a minimum of 30 minutes discussed and encouraged, as a part of hypertension management. The importance of attaining a healthy weight is also discussed.  BP/Weight 12/31/2017 12/24/2017 11/20/2017 11/01/2017 10/31/2017 10/30/2017 11/04/2351  Systolic BP 614 97 431 540 086 761 -  Diastolic BP 54 48 58 68 59 60 -  Wt. (Lbs) 157 - 158 - 156.8 - 156.53  BMI 31.71 - 31.91 - 31.67 - 31.61  Some encounter information is confidential and restricted. Go to Review Flowsheets activity to see all data.       Labile hypertension Uncontrolled with marked fluctuation. Refer to cardiology for ambulatory BP monitoring Medication adjustment made with follow up in 6 to 8 weeks DASH diet and commitment to daily physical activity for a minimum of 30 minutes discussed and encouraged, as a part of hypertension management. The importance of  attaining a healthy weight is also discussed.  BP/Weight 01/31/2018 01/31/2018 01/29/2018 01/25/2018 12/31/2017 12/24/2017 06/29/3788  Systolic BP 240 973 532 992 426 97 834  Diastolic BP 66 80 60 52 54 48 58  Wt. (Lbs) 163 163 164 164 157 - 158  BMI 32.92 32.92 33.12 33.12 31.71 - 31.91  Some encounter information is confidential and restricted. Go to Review Flowsheets activity to see all  data.       Type 2 diabetes mellitus with hyperglycemia, with long-term current use of insulin (HCC) Controlled, no change in medication [name] is reminded of the importance of commitment to daily physical activity for 30 minutes or more, as able and the need to limit carbohydrate intake to 30 to 60 grams per meal to help with blood sugar control.   The need to take medication as prescribed, test blood sugar as directed, and to call between visits if there is a concern that blood sugar is uncontrolled is also discussed.   [name] is reminded of the importance of daily foot exam, annual eye examination, and good blood sugar, blood pressure and cholesterol control. Treated by endo  Diabetic Labs Latest Ref Rng & Units 01/29/2018 01/23/2018 11/01/2017 10/31/2017 10/29/2017  HbA1c 4.0 - 5.6 % - - - - -  Microalbumin <2.0 mg/dL - - - - -  Micro/Creat Ratio 0.0 - 30.0 mg/g - - - - -  Chol 100 - 199 mg/dL - - - - -  HDL >39 mg/dL - - - - -  Calc LDL 0 - 99 mg/dL - - - - -  Triglycerides 0 - 149 mg/dL - - - - -  Creatinine 0.57 - 1.00 mg/dL 1.17(H) 0.80 1.14(H) 1.55(H) 1.01(H)  GFR >60.00 mL/min - - - - -   BP/Weight 01/31/2018 01/31/2018 01/29/2018 01/25/2018 12/31/2017 12/24/2017 1/96/2229  Systolic BP 798 921 194 174 081 97 448  Diastolic BP 66 80 60 52 54 48 58  Wt. (Lbs) 163 163 164 164 157 - 158  BMI 32.92 32.92 33.12 33.12 31.71 - 31.91  Some encounter information is confidential and restricted. Go to Review Flowsheets activity to see all data.   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

## 2017-12-31 NOTE — Assessment & Plan Note (Signed)
Over corrected, stop am clonidine and only take omn at night DASH diet and commitment to daily physical activity for a minimum of 30 minutes discussed and encouraged, as a part of hypertension management. The importance of attaining a healthy weight is also discussed.  BP/Weight 12/31/2017 12/24/2017 11/20/2017 11/01/2017 10/31/2017 10/30/2017 4/65/6812  Systolic BP 751 97 700 174 944 967 -  Diastolic BP 54 48 58 68 59 60 -  Wt. (Lbs) 157 - 158 - 156.8 - 156.53  BMI 31.71 - 31.91 - 31.67 - 31.61  Some encounter information is confidential and restricted. Go to Review Flowsheets activity to see all data.

## 2017-12-31 NOTE — Assessment & Plan Note (Addendum)
Asked: currently smokes 10/day cigarettes [place]" wants to quit but states nervous/ anxious so unable Advise: needs to quit to reduce stroke, heart disease and cancer risk, has had gum cancer Assist: counseled for approx 5 mins, call QUIT NOW numbers, has patches, wants inhaler states patch falls off Arrange: f/u in 6 weeks

## 2018-01-02 ENCOUNTER — Ambulatory Visit (INDEPENDENT_AMBULATORY_CARE_PROVIDER_SITE_OTHER): Payer: Medicare HMO | Admitting: Psychiatry

## 2018-01-02 ENCOUNTER — Telehealth (HOSPITAL_COMMUNITY): Payer: Self-pay | Admitting: *Deleted

## 2018-01-02 DIAGNOSIS — F331 Major depressive disorder, recurrent, moderate: Secondary | ICD-10-CM | POA: Diagnosis not present

## 2018-01-02 NOTE — Progress Notes (Signed)
Patient:  [name]                DOB: April 15, 1950          MR Number:  [id number]  Location:   [place]:  [address]                       [place],  [place],    [place]          Start:  Wednesday 01/02/2018 2:20  PM  End:       Wednesday 01/02/2018 3:05 PM       Provider/Observer:     Maurice Small, MSW, LCSW   Chief Complaint:                                                    Depression  Reason For Service:               [name] is a 67 y.o. female who presents with a long standing history of recurrent periods of depression beginning when she was 7 and her favorite uncle died. She h         as been experiencing increased symptoms of depression recently due to stress in the relationship with her son. Per patient's report, he rarely visits her anymore due to his involvement with an older woman who is controlling. She also reports frustration regarding her knees who is her power of attorn   ey and is very demanding perpatient's report. She reports issues with other family members and states she needs help dealing with her family Patient reports multiple psychiatric hospitlaizations due to depression and suicidal ideations with thel last one occuring in 1997. Patient has participated in outpatient psychotherapy and medication management intermittently since age 47.  She currently is seeing psychiatrist Dr. Harrington Challenger . Prior to this, she was being seen at [facility]. Patient also has had ECT at [facility].    Interventions Strategy:  Supportive  Participation Level:   Active      Participation Quality:  Appropriate      Behavioral Observation:  Casual, well groomed, speech very slow,   Current Psychosocial Factors: Tension in relationship with son, multiple health issues, concerns about grandson,   Content of Session:   reviewed symptoms, praised and reinforced patient's efforts to use walker. discussed stressors, facilitated expression of thoughts and feelings,  validated feelings, assisted patient identify relaxation techniques and distracting activities such as listening to music, discussed rationale for and practiced using mindfulness technique using breath awareness to cope with ruminating thoughts,   Current Status:   Fluctuations in mood, worry,     Suicidal/Homicidal:    No   Patient Progress:   Patient last was seen about a monthago. She reports she hasn't fallen since last session and has been using her walker consistently. She has new aides and reports positive interaction and support from aides. Patient expresses frustration regarding her continued multiple health issues. She reports having knots on her leg and being referred to have it removed and have biopsy.  She says she has another tumor in her brain and expresses appropriate concern about this.  She expresses frustration she is experiencing increased poor concentration and reports some difficulty reading. She has relied on her faith and reading her bible as  coping tools in the past but states now being scared when reading the bible as she loses interest. She also reports nervousness and says she isn't sleeping well. She will discuss with CMA today.    Target Goals:   1.  Learn and implement behavioral strategies to overcome depression.    2.  Verbalize an understanding and resolution of current interpersonal problems.   Last Reviewed:   10/13/2016  Goals Addressed Today:    1,2  Plan:      Return again in 2 - 3 weeks.  Impression/Diagnosis:   Patient presents with long standing history of recurrent periods  of depression beginning when she was [age] and her favorite uncle died. Patient reports multiple psychiatric hospitlaizations due to depression and suicidal ideations with thel last one occuring in 1997. Patient has participated in outpatient psychotherapy and medication management intermittently since age 31.  She currently is seeing psychiatrist Dr. Harrington Challenger . Prior to this, she was being  seen at [facility]. Patient also has had ECT at [facility]. Symptoms have worsened in recent months due to family stress and have  included depressed mood, anxiety, excessive worry, and tearfulness.   Diagnosis:                                Axis I: MDD recurrent episode, moderate          Axis II: Deferred     [name] Secrist, LCSW 01/02/2018

## 2018-01-02 NOTE — Telephone Encounter (Signed)
I cannot increase Trazodone as she has had numerous falls and she still calls [name] complaining of dizziness and grogginess. She can try adding melatonin 5- 10 mg at night- can be bought without prescription

## 2018-01-02 NOTE — Telephone Encounter (Signed)
Dr Harrington Challenger Patient called in today stating that she is having problems sleeping since her Trazodone was decreased. She says she doesn't fall off to sleep until  3 or 4 in the morning. Patient stated she has appointment today with Therapist Peggy  @ 2 pm & her next appointment to see you is  02/04/18. She said she will stop by to see me for how the doctor will proceed.

## 2018-01-08 ENCOUNTER — Other Ambulatory Visit: Payer: Self-pay | Admitting: Cardiovascular Disease

## 2018-01-09 ENCOUNTER — Ambulatory Visit: Payer: Self-pay

## 2018-01-09 DIAGNOSIS — R32 Unspecified urinary incontinence: Secondary | ICD-10-CM | POA: Diagnosis not present

## 2018-01-09 DIAGNOSIS — M150 Primary generalized (osteo)arthritis: Secondary | ICD-10-CM | POA: Diagnosis not present

## 2018-01-09 DIAGNOSIS — I639 Cerebral infarction, unspecified: Secondary | ICD-10-CM | POA: Diagnosis not present

## 2018-01-09 DIAGNOSIS — E119 Type 2 diabetes mellitus without complications: Secondary | ICD-10-CM | POA: Diagnosis not present

## 2018-01-11 DIAGNOSIS — I10 Essential (primary) hypertension: Secondary | ICD-10-CM | POA: Diagnosis not present

## 2018-01-11 DIAGNOSIS — J449 Chronic obstructive pulmonary disease, unspecified: Secondary | ICD-10-CM | POA: Diagnosis not present

## 2018-01-14 ENCOUNTER — Telehealth: Payer: Self-pay

## 2018-01-14 ENCOUNTER — Other Ambulatory Visit: Payer: Self-pay | Admitting: Family Medicine

## 2018-01-14 DIAGNOSIS — M4716 Other spondylosis with myelopathy, lumbar region: Secondary | ICD-10-CM | POA: Diagnosis not present

## 2018-01-14 DIAGNOSIS — M5442 Lumbago with sciatica, left side: Secondary | ICD-10-CM | POA: Diagnosis not present

## 2018-01-14 DIAGNOSIS — G8929 Other chronic pain: Secondary | ICD-10-CM | POA: Diagnosis not present

## 2018-01-14 DIAGNOSIS — E119 Type 2 diabetes mellitus without complications: Secondary | ICD-10-CM | POA: Diagnosis not present

## 2018-01-14 DIAGNOSIS — F060 Psychotic disorder with hallucinations due to known physiological condition: Secondary | ICD-10-CM | POA: Diagnosis not present

## 2018-01-14 DIAGNOSIS — M542 Cervicalgia: Secondary | ICD-10-CM | POA: Diagnosis not present

## 2018-01-14 DIAGNOSIS — M5136 Other intervertebral disc degeneration, lumbar region: Secondary | ICD-10-CM | POA: Diagnosis not present

## 2018-01-14 DIAGNOSIS — Z794 Long term (current) use of insulin: Secondary | ICD-10-CM | POA: Diagnosis not present

## 2018-01-14 DIAGNOSIS — R260 Ataxic gait: Secondary | ICD-10-CM | POA: Diagnosis not present

## 2018-01-14 MED ORDER — AZITHROMYCIN 250 MG PO TABS: ORAL_TABLET | ORAL | 0 refills | Status: DC

## 2018-01-14 NOTE — Telephone Encounter (Signed)
Called patient she is aware.

## 2018-01-14 NOTE — Progress Notes (Unsigned)
azithromy

## 2018-01-14 NOTE — Telephone Encounter (Signed)
z pack is prescribed , please let her know

## 2018-01-14 NOTE — Telephone Encounter (Signed)
Patient called and is requesting an antibiotic for a possible sinus infection, reports having headache, sore throat, sinus drainage, and feels cold. This started over the weekend. Patient's next appointment is at the end of November, [name] has tried to work her in for an appointment sooner and was unable to. Please advise

## 2018-01-16 ENCOUNTER — Ambulatory Visit (HOSPITAL_COMMUNITY): Payer: Self-pay | Admitting: Psychiatry

## 2018-01-16 DIAGNOSIS — F060 Psychotic disorder with hallucinations due to known physiological condition: Secondary | ICD-10-CM | POA: Diagnosis not present

## 2018-01-16 DIAGNOSIS — G8929 Other chronic pain: Secondary | ICD-10-CM | POA: Diagnosis not present

## 2018-01-16 DIAGNOSIS — I10 Essential (primary) hypertension: Secondary | ICD-10-CM

## 2018-01-16 DIAGNOSIS — M5136 Other intervertebral disc degeneration, lumbar region: Secondary | ICD-10-CM | POA: Diagnosis not present

## 2018-01-16 DIAGNOSIS — I959 Hypotension, unspecified: Secondary | ICD-10-CM

## 2018-01-16 DIAGNOSIS — M5442 Lumbago with sciatica, left side: Secondary | ICD-10-CM | POA: Diagnosis not present

## 2018-01-16 DIAGNOSIS — E119 Type 2 diabetes mellitus without complications: Secondary | ICD-10-CM | POA: Diagnosis not present

## 2018-01-16 DIAGNOSIS — R260 Ataxic gait: Secondary | ICD-10-CM | POA: Diagnosis not present

## 2018-01-16 DIAGNOSIS — M4716 Other spondylosis with myelopathy, lumbar region: Secondary | ICD-10-CM | POA: Diagnosis not present

## 2018-01-16 DIAGNOSIS — Q7649 Other congenital malformations of spine, not associated with scoliosis: Secondary | ICD-10-CM

## 2018-01-16 DIAGNOSIS — Z794 Long term (current) use of insulin: Secondary | ICD-10-CM

## 2018-01-16 DIAGNOSIS — M542 Cervicalgia: Secondary | ICD-10-CM | POA: Diagnosis not present

## 2018-01-21 ENCOUNTER — Telehealth: Payer: Self-pay

## 2018-01-21 DIAGNOSIS — R040 Epistaxis: Secondary | ICD-10-CM

## 2018-01-21 NOTE — Telephone Encounter (Signed)
Requested to be referred to Dr Delia Chimes to treat her chronic nosebleeds. Said she wasn't satisfied with dr [name]

## 2018-01-22 ENCOUNTER — Other Ambulatory Visit: Payer: Medicare HMO | Admitting: Orthotics

## 2018-01-23 ENCOUNTER — Ambulatory Visit (HOSPITAL_COMMUNITY)
Admission: RE | Admit: 2018-01-23 | Discharge: 2018-01-23 | Disposition: A | Discharge status: Home / Self Care | Payer: Medicare HMO | Source: Ambulatory Visit | Attending: Internal Medicine | Admitting: Internal Medicine

## 2018-01-23 DIAGNOSIS — R0602 Shortness of breath: Secondary | ICD-10-CM | POA: Diagnosis not present

## 2018-01-23 DIAGNOSIS — Z79891 Long term (current) use of opiate analgesic: Secondary | ICD-10-CM | POA: Diagnosis not present

## 2018-01-23 DIAGNOSIS — M542 Cervicalgia: Secondary | ICD-10-CM | POA: Diagnosis not present

## 2018-01-23 DIAGNOSIS — Z79899 Other long term (current) drug therapy: Secondary | ICD-10-CM | POA: Diagnosis not present

## 2018-01-23 DIAGNOSIS — J9811 Atelectasis: Secondary | ICD-10-CM | POA: Insufficient documentation

## 2018-01-23 DIAGNOSIS — I700 Atherosclerosis of aorta: Secondary | ICD-10-CM | POA: Diagnosis not present

## 2018-01-23 DIAGNOSIS — G894 Chronic pain syndrome: Secondary | ICD-10-CM | POA: Diagnosis not present

## 2018-01-23 DIAGNOSIS — I2510 Atherosclerotic heart disease of native coronary artery without angina pectoris: Secondary | ICD-10-CM | POA: Diagnosis not present

## 2018-01-23 DIAGNOSIS — Z720 Tobacco use: Secondary | ICD-10-CM | POA: Diagnosis not present

## 2018-01-23 DIAGNOSIS — M5030 Other cervical disc degeneration, unspecified cervical region: Secondary | ICD-10-CM | POA: Diagnosis not present

## 2018-01-23 DIAGNOSIS — Z5181 Encounter for therapeutic drug level monitoring: Secondary | ICD-10-CM | POA: Diagnosis not present

## 2018-01-23 LAB — POCT I-STAT CREATININE: Creatinine, Ser: 0.8 mg/dL (ref 0.44–1.00)

## 2018-01-23 MED ORDER — IOHEXOL 300 MG/ML  SOLN
75.0000 mL | Freq: Once | INTRAMUSCULAR | Status: AC | PRN
  Administered 2018-01-23: 75 mL via INTRAVENOUS

## 2018-01-25 ENCOUNTER — Encounter: Payer: Self-pay | Admitting: Internal Medicine

## 2018-01-25 ENCOUNTER — Encounter

## 2018-01-25 ENCOUNTER — Ambulatory Visit (INDEPENDENT_AMBULATORY_CARE_PROVIDER_SITE_OTHER): Payer: Medicare HMO | Admitting: Internal Medicine

## 2018-01-25 VITALS — BP 118/52 | HR 77 | Ht 59.0 in | Wt 164.0 lb

## 2018-01-25 DIAGNOSIS — R140 Abdominal distension (gaseous): Secondary | ICD-10-CM

## 2018-01-25 DIAGNOSIS — K3184 Gastroparesis: Secondary | ICD-10-CM

## 2018-01-25 DIAGNOSIS — R110 Nausea: Secondary | ICD-10-CM | POA: Diagnosis not present

## 2018-01-25 DIAGNOSIS — R109 Unspecified abdominal pain: Secondary | ICD-10-CM

## 2018-01-25 DIAGNOSIS — G8929 Other chronic pain: Secondary | ICD-10-CM

## 2018-01-25 DIAGNOSIS — K581 Irritable bowel syndrome with constipation: Secondary | ICD-10-CM | POA: Diagnosis not present

## 2018-01-25 NOTE — Progress Notes (Signed)
Subjective:    Patient ID: [name], female    DOB: 03-05-1951, 67 y.o.   MRN: [id number]  HPI [name] is a 67 year old female the history of chronic left-sided abdominal pain, chronic abdominal bloating, constipation, colon polyps, colonic diverticulosis, gastroparesis who is here for follow-up.  She also has a history of pulmonary hypertension, iron deficiency receiving IV iron periodically, COPD, sleep apnea, seizure disorder, bipolar disorder and diabetes.  She has had a history of CVA with left hemiparesis.  She was last seen on 07/06/2017 by Nicoletta Ba, PA-C and she is here alone today.  She reports that she continues to have issues with left-sided abdominal pain and significant bloating.  She also deals with periodic nausea.  She has stopped her MiraLAX because this ran out she was previously using it 1-2 times per day.  She does have issues with crampy abdominal discomfort at times and will use Bentyl 10 mg.  This does worsen constipation.  She is having a bowel movement usually once sometimes 2 times per day.  Wonders why she cannot lose weight despite appetite being fair and not overeating.  No blood in her stool or melena.  She has continued AcipHex twice daily and denies heartburn and dysphagia.  She does struggle intermittently with nausea and on one occasion in the last couple of months of an episode of vomiting.  After her last visit she tried topical lidocaine for chronic abdominal wall pain which she said was not very helpful.  Review of Systems As per HPI, otherwise negative  Current Medications, Allergies, Past Medical History, Past Surgical History, Family History and Social History were reviewed in [employer] record.     Objective:   Physical Exam BP (!) 118/52   Pulse 77   Ht 4\' 11"  (1.499 m)   Wt 164 lb (74.4 kg)   BMI 33.12 kg/m  Constitutional: Well-developed and well-nourished. No distress. HEENT: Normocephalic and atraumatic.   Conjunctivae are normal.  No scleral icterus. Neck: Neck supple. Trachea midline. Cardiovascular: Normal rate, regular rhythm and intact distal pulses.  Pulmonary/chest: Effort normal and breath sounds normal. No wheezing, rales or rhonchi. Abdominal: Soft, left-sided tenderness without rebound or guarding, obese, moderately distended without fluid wave, bowel sounds present but hypoactive. Extremities: no clubbing, cyanosis, or edema Neurological: Alert and oriented to person place and time. Skin: Skin is warm and dry. Psychiatric: Normal mood and affect. Behavior is normal.  CT ABDOMEN AND PELVIS WITH CONTRAST   TECHNIQUE: Multidetector CT imaging of the abdomen and pelvis was performed using the standard protocol following bolus administration of intravenous contrast.   CONTRAST:  111mL ISOVUE-300 IOPAMIDOL (ISOVUE-300) INJECTION 61%   COMPARISON:  CT of the abdomen and pelvis performed 05/31/2017   FINDINGS: Lower chest: Minimal left basilar atelectasis is noted. The visualized portions of the mediastinum are unremarkable.   Hepatobiliary: The liver is unremarkable in appearance. The patient is status post cholecystectomy, with clips noted at the gallbladder fossa. The common bile duct remains normal in caliber.   Pancreas: The pancreas is within normal limits.   Spleen: The spleen is unremarkable in appearance.   Adrenals/Urinary Tract: The adrenal glands are unremarkable in appearance. The kidneys are within normal limits. There is no evidence of hydronephrosis. No renal or ureteral stones are identified. No perinephric stranding is seen.   Stomach/Bowel: The stomach is unremarkable in appearance. The small bowel is within normal limits. The appendix is normal in caliber, without evidence of appendicitis.  Mild diverticulosis is noted along the descending and sigmoid colon, without evidence of diverticulitis. A small amount of stool is noted in the colon.     Vascular/Lymphatic: Scattered calcification is seen along the abdominal aorta and its branches. The abdominal aorta is otherwise grossly unremarkable. The inferior vena cava is grossly unremarkable. No retroperitoneal lymphadenopathy is seen. No pelvic sidewall lymphadenopathy is identified.   Reproductive: The bladder is mildly distended and grossly unremarkable. The patient is status post hysterectomy. No suspicious adnexal masses are seen.   Other: No additional soft tissue abnormalities are seen.   Musculoskeletal: No acute osseous abnormalities are identified. The patient is status post decompression at the lower lumbar spine. Underlying vacuum phenomenon is noted. The visualized musculature is unremarkable in appearance.   IMPRESSION: 1. No acute abnormality seen to explain the patient's symptoms. Small amount of stool noted in the colon, without CT evidence for constipation. 2. Mild diverticulosis along the descending and sigmoid colon, without evidence of diverticulitis.   Aortic Atherosclerosis (ICD10-I70.0).     Electronically Signed   By: Garald Balding M.D.   On: 06/28/2017 01:16   CBC    Component Value Date/Time   WBC 8.9 11/01/2017 0147   RBC 4.54 11/01/2017 0147   HGB 10.7 (L) 11/01/2017 0147   HGB 10.5 (L) 09/27/2016 1047   HCT 35.1 (L) 11/01/2017 0147   HCT 34.9 09/27/2016 1047   PLT 213 11/01/2017 0147   PLT 292 09/27/2016 1047   MCV 77.3 (L) 11/01/2017 0147   MCV 76 (L) 09/27/2016 1047   MCH 23.6 (L) 11/01/2017 0147   MCHC 30.5 11/01/2017 0147   RDW 14.2 11/01/2017 0147   RDW 15.2 09/27/2016 1047   LYMPHSABS 1.5 11/01/2017 0147   MONOABS 0.5 11/01/2017 0147   EOSABS 0.1 11/01/2017 0147   BASOSABS 0.0 11/01/2017 0147   CMP     Component Value Date/Time   NA 140 11/01/2017 0147   NA 144 02/05/2017 1142   K 4.5 11/01/2017 0147   CL 105 11/01/2017 0147   CO2 25 11/01/2017 0147   GLUCOSE 148 (H) 11/01/2017 0147   BUN 25 (H) 11/01/2017  0147   BUN 11 02/05/2017 1142   CREATININE 0.80 01/23/2018 1407   CREATININE 0.90 07/19/2015 0925   CALCIUM 9.5 11/01/2017 0147   PROT 7.5 11/01/2017 0147   PROT 7.0 02/05/2017 1142   ALBUMIN 4.3 11/01/2017 0147   ALBUMIN 4.4 02/05/2017 1142   AST 32 11/01/2017 0147   ALT 28 11/01/2017 0147   ALKPHOS 92 11/01/2017 0147   BILITOT 0.5 11/01/2017 0147   BILITOT 0.3 02/05/2017 1142   GFRNONAA 49 (L) 11/01/2017 0147   GFRNONAA 67 07/19/2015 0925   GFRAA 56 (L) 11/01/2017 0147   GFRAA 78 07/19/2015 0925         Assessment & Plan:   67 year old female the history of chronic left-sided abdominal pain, chronic abdominal bloating, constipation, colon polyps, colonic diverticulosis, gastroparesis who is here for follow-up.  1.  Chronic left-sided abdominal pain/abdominal bloating/constipation --some of her abdominal bloating as well as abdominal discomfort is felt secondary to constipation and IBS.  She seems to do better on MiraLAX and so were going to resume MiraLAX 17 g once to twice daily.  I am going to try her on IBgard 2 capsules twice daily with food for abdominal bloating and discomfort.  Discontinue Bentyl as this likely worsens constipation  2.  GERD/nausea/gastroparesis --nausea is likely secondary to gastroparesis.  We reviewed gastroparesis diet.  Tight glucose control is very important to control this issue.  Continue AcipHex twice daily.  I imagine Zofran would be constipating and so it was not prescribed.  Improving #1 may also improve nausea.  3.  History of colon polyps --surveillance colonoscopy due November 2022  Follow-up in 1 to 2 months with me or APP  25 minutes spent with the patient today. Greater than 50% was spent in counseling and coordination of care with the patient

## 2018-01-25 NOTE — Patient Instructions (Addendum)
Please purchase the following medications over the counter and take as directed: IB Gard-Take 2 capsules with breakfast and dinner  Discontinue Bentyl.  Continue Aciphex 20 mg twice daily.  Please follow up with Nicoletta Ba, PA-C on Thursday, 02/21/18 at 2:00 pm.  If you are age 67 or older, your body mass index should be between 23-30. Your Body mass index is 33.12 kg/m. If this is out of the aforementioned range listed, please consider follow up with your Primary Care Provider.  If you are age 28 or younger, your body mass index should be between 19-25. Your Body mass index is 33.12 kg/m. If this is out of the aformentioned range listed, please consider follow up with your Primary Care Provider.

## 2018-01-28 ENCOUNTER — Other Ambulatory Visit: Payer: Self-pay | Admitting: Internal Medicine

## 2018-01-28 ENCOUNTER — Other Ambulatory Visit: Payer: Self-pay

## 2018-01-28 DIAGNOSIS — M542 Cervicalgia: Secondary | ICD-10-CM | POA: Diagnosis not present

## 2018-01-28 DIAGNOSIS — M5136 Other intervertebral disc degeneration, lumbar region: Secondary | ICD-10-CM | POA: Diagnosis not present

## 2018-01-28 DIAGNOSIS — F060 Psychotic disorder with hallucinations due to known physiological condition: Secondary | ICD-10-CM | POA: Diagnosis not present

## 2018-01-28 DIAGNOSIS — M5442 Lumbago with sciatica, left side: Secondary | ICD-10-CM | POA: Diagnosis not present

## 2018-01-28 DIAGNOSIS — E119 Type 2 diabetes mellitus without complications: Secondary | ICD-10-CM | POA: Diagnosis not present

## 2018-01-28 DIAGNOSIS — R260 Ataxic gait: Secondary | ICD-10-CM | POA: Diagnosis not present

## 2018-01-28 DIAGNOSIS — G8929 Other chronic pain: Secondary | ICD-10-CM | POA: Diagnosis not present

## 2018-01-28 DIAGNOSIS — Z794 Long term (current) use of insulin: Secondary | ICD-10-CM | POA: Diagnosis not present

## 2018-01-28 DIAGNOSIS — M4716 Other spondylosis with myelopathy, lumbar region: Secondary | ICD-10-CM | POA: Diagnosis not present

## 2018-01-28 MED ORDER — NICOTINE 10 MG/ML NA SOLN: NASAL | 0 refills | Status: DC

## 2018-01-29 ENCOUNTER — Encounter: Payer: Self-pay | Admitting: Adult Health

## 2018-01-29 ENCOUNTER — Ambulatory Visit (INDEPENDENT_AMBULATORY_CARE_PROVIDER_SITE_OTHER): Payer: Medicare HMO | Admitting: Adult Health

## 2018-01-29 VITALS — BP 120/60 | HR 74 | Ht 59.0 in | Wt 164.0 lb

## 2018-01-29 DIAGNOSIS — Z5181 Encounter for therapeutic drug level monitoring: Secondary | ICD-10-CM | POA: Diagnosis not present

## 2018-01-29 DIAGNOSIS — G40909 Epilepsy, unspecified, not intractable, without status epilepticus: Secondary | ICD-10-CM | POA: Diagnosis not present

## 2018-01-29 NOTE — Progress Notes (Signed)
PATIENT: [name] DOB: [birth year]  REASON FOR VISIT: follow up HISTORY FROM: patient  HISTORY OF PRESENT ILLNESS: Today 01/29/18:  [name] is a 67 year old female with a history of seizures.  She returns today for follow-up.  She is currently on Lamictal.  She is tolerating this medication well.  She denies any seizure events.  She continues to live at home alone.  She does have an aide that comes in and helps with ADLs.  She does not operate a motor vehicle.  She returns today for evaluation.  HISTORY (copied from Dr. Tobey Grim note) 07/26/17  [name] is a 38 year old right-handed black female with a history of seizures following resection of a meningioma.  The patient has been seen by Dr. Sherwood Gambler in the past.  The patient is followed by Dr. Nelva Bush for ongoing chronic neck and low back pain, the patient has pain into the left shoulder, some discomfort going up the back of the head and on the top of the head, with cervicogenic headache.  The patient is getting some injections in the neck and shoulder, she is on chronic opioid therapy.  She is on a multitude of various medications.  She sees psychiatry for bipolar disorder.  She comes into the office today for an evaluation.  She uses a walker for ambulation, she will fall on occasion.  She has fallen 3 times since last seen.  She has not had any recurrent seizures, she does not operate a motor vehicle.  REVIEW OF SYSTEMS: Out of a complete 14 system review of symptoms, the patient complains only of the following symptoms, and all other reviewed systems are negative.  ALLERGIES: Allergies  Allergen Reactions  . Cephalexin Hives  . Iron Nausea And Vomiting  . Milk-Related Compounds Other (See Comments)    Doesn't agree with stomach.   . Penicillins Hives    Has patient had a PCN reaction causing immediate rash, facial/tongue/throat swelling, SOB or lightheadedness with hypotension: Yes Has patient had a PCN reaction causing severe  rash involving mucus membranes or skin necrosis: No Has patient had a PCN reaction that required hospitalization No Has patient had a PCN reaction occurring within the last 10 years: No If all of the above answers are "NO", then may proceed with Cephalosporin use.   [name] Phenazopyridine Hcl Hives          HOME MEDICATIONS: Outpatient Medications Prior to Visit  Medication Sig Dispense Refill  . ACCU-CHEK FASTCLIX LANCETS MISC Use to check blood sugar 3 times a day. 90 each 3  . ACCU-CHEK SMARTVIEW test strip TEST BLOOD SUGAR FOUR TIMES DAILY AS DIRECTED 350 each 2  . albuterol (PROVENTIL HFA;VENTOLIN HFA) 108 (90 Base) MCG/ACT inhaler Inhale 1-2 puffs every 6 hours as needed for wheezing, shortness of breath 3 Inhaler 3  . amLODipine (NORVASC) 5 MG tablet Take 1 tablet (5 mg total) by mouth daily. 90 tablet 1  . ascorbic acid (VITAMIN C) 500 MG tablet Take 500 mg by mouth daily.    [name] aspirin EC 81 MG tablet Take 81 mg by mouth daily.    . Blood Glucose Monitoring Suppl (ACCU-CHEK AVIVA) device Use as instructed 1 each 0  . cetirizine (ZYRTEC) 10 MG tablet Take 1 tablet (10 mg total) by mouth daily. 7 tablet 0  . Cholecalciferol (VITAMIN D3) 5000 units CAPS Take 1 capsule by mouth daily.    . cloNIDine (CATAPRES) 0.3 MG tablet One tablet once daily at bedtime only for  blood pressure 90 tablet 1  . diclofenac sodium (VOLTAREN) 1 % GEL Apply 2 g topically daily as needed (pain). Rubs "wherever there is pain" when she doesn't take her pain pill. 100 g 0  . docusate sodium (COLACE) 100 MG capsule Take 100 mg by mouth 2 (two) times daily.    . DROPLET PEN NEEDLES 32G X 4 MM MISC USE  TO INJECT FOUR TIMES DAILY 400 each 0  . hydroxychloroquine (PLAQUENIL) 200 MG tablet Take 200 mg by mouth daily.     . insulin aspart (NOVOLOG FLEXPEN) 100 UNIT/ML FlexPen INJECT 10 TO 14 UNITS INTO THE SKIN 3 (THREE) TIMES DAILY WITH MEALS. 45 mL 3  . Insulin Glargine (TOUJEO SOLOSTAR) 300 UNIT/ML SOPN Inject 20  Units into the skin at bedtime. 9 pen 3  . lamoTRIgine (LAMICTAL) 100 MG tablet TAKE 1 TABLET TWICE DAILY 180 tablet 0  . levothyroxine (SYNTHROID, LEVOTHROID) 50 MCG tablet TAKE 1 TABLET  DAILY  EXCEPT TAKE 1/2 TABLET  ON  SUNDAY 85 tablet 1  . LORazepam (ATIVAN) 0.5 MG tablet Take 1 tablet (0.5 mg total) by mouth 2 (two) times daily. 180 tablet 2  . losartan (COZAAR) 50 MG tablet Take 1 tablet (50 mg total) by mouth daily. 90 tablet 1  . metoprolol tartrate (LOPRESSOR) 50 MG tablet TAKE 1 TABLET TWICE DAILY (NEED MD APPOINTMENT) 60 tablet 3  . montelukast (SINGULAIR) 10 MG tablet TAKE 1 TABLET EVERY DAY 90 tablet 1  . Multiple Vitamins-Minerals (HM MULTIVITAMIN ADULT GUMMY PO) Take 1 tablet by mouth daily.    . Nicotine (NICOTROL NS) 10 MG/ML SOLN Use 1 spray per nostril up to eight times per day as needed 10 mL 0  . nystatin (MYCOSTATIN/NYSTOP) powder APPLY TO AFFECTED AREA 4 TIMES DAILY. 30 g 2  . oxyCODONE-acetaminophen (PERCOCET/ROXICET) 5-325 MG tablet Take 1 tablet by mouth every 6 (six) hours as needed.     . pregabalin (LYRICA) 75 MG capsule Take 1 capsule (75 mg total) by mouth daily.    . RABEprazole (ACIPHEX) 20 MG tablet Take 1 tablet (20 mg total) by mouth 2 (two) times daily. 180 tablet 3  . RESTASIS 0.05 % ophthalmic emulsion Place 1 drop into both eyes 2 (two) times daily.     . risperiDONE (RISPERDAL) 0.5 MG tablet Take 1 tablet (0.5 mg total) by mouth at bedtime. 90 tablet 2  . rosuvastatin (CRESTOR) 5 MG tablet TAKE 1 TABLET AT BEDTIME 90 tablet 1  . sertraline (ZOLOFT) 100 MG tablet Take 1 tablet (100 mg total) by mouth daily. 90 tablet 2  . traZODone (DESYREL) 50 MG tablet Take 1 tablet (50 mg total) by mouth at bedtime. 90 tablet 2  . UNABLE TO FIND Transport Chair x 1  Dx-bilateral leg weakness, frequent falls 1 each 0  . UNABLE TO FIND Rolling walker with seat x 1  DX. Bilateral lower extremity weakness, frequent falls, instability 1 each 0  . triamcinolone ointment  (KENALOG) 0.1 % Apply 1 application topically at bedtime. Apply to elbows at bedtime.     No facility-administered medications prior to visit.     PAST MEDICAL HISTORY: Past Medical History:  Diagnosis Date  . Anemia   . Anxiety    takes Ativan daily  . Arthritis   . Assistance needed for mobility   . Bipolar disorder ([place])    takes Risperdal nightly  . Blood transfusion   . Cancer ([place])    In her gum  . Carpal  tunnel syndrome of right wrist 05/23/2011  . Cervical disc disorder with radiculopathy of cervical region 10/31/2012  . Chronic back pain   . Chronic idiopathic constipation   . Chronic neck and back pain   . Colon polyps   . COPD (chronic obstructive pulmonary disease) with chronic bronchitis ([place]) 09/16/2013   Office Spirometry 10/30/2013-submaximal effort based on appearance of loop and curve. Numbers would fit with severe restriction but her physiologic capability may be better than this. FVC 0.91/44%, and 10.74/45%, FEV1/FVC 0.81, FEF 25-75% 1.43/69%    . Depression    takes Zoloft daily  . Diabetes mellitus    Type II  . Diverticulosis    TCS 9/08 by Dr. Delfin Edis for diarrhea . Bx for micro scopic colitis negative.   . Fibromyalgia   . Frequent falls   . GERD (gastroesophageal reflux disease)    takes Aciphex daily  . Glaucoma    eye drops daily  . Gum symptoms    infection on antibiotic  . Hemiplegia affecting non-dominant side, post-stroke 08/02/2011  . Hyperlipidemia    takes Crestor daily  . Hypertension    takes Amlodipine,Metoprolol,and Clonidine daily  . Hypothyroidism    takes Synthroid daily  . IBS (irritable bowel syndrome)   . Insomnia    takes Trazodone nightly  . 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 Metabolic encephalopathy 3/57/0177  . Migraines    chronic headaches  . Mononeuritis lower limb   . Narcolepsy   . Osteoporosis   . Pancreatitis 2006   due to Depakote with normal EUS   . Schatzki's ring    non critical / EGD with ED  8/2011with RMR  . Seizures ([place])    takes Lamictal daily.Last seizure 3 yrs ago  . Sleep apnea    on CPAP  . Stroke [facility])    left sided weakness, speech changes  . Tubular adenoma of colon     PAST SURGICAL HISTORY: Past Surgical History:  Procedure Laterality Date  . ABDOMINAL HYSTERECTOMY  1978  . BACK SURGERY  July 2012  . BACTERIAL OVERGROWTH TEST N/A 05/05/2013   Procedure: BACTERIAL OVERGROWTH TEST;  Surgeon: Daneil Dolin, MD;  [facility];  Service: Endoscopy;  Laterality: N/A;  7:30  . BIOPSY THYROID  2009  . BRAIN SURGERY  11/2011   resection of meningioma  . BREAST REDUCTION SURGERY  1994  . CARDIAC CATHETERIZATION  05/10/2005   normal coronaries, normal LV systolic function and EF (Dr. Jackie Plum)  . CARPAL TUNNEL RELEASE Left 07/22/04   Dr. Aline Brochure  . CATARACT EXTRACTION Bilateral   . CHOLECYSTECTOMY  1984  . COLONOSCOPY N/A 09/25/2012   [id number] diverticulosis.  colonic polyp-removed : tubular adenoma  . CRANIOTOMY  11/23/2011   Procedure: CRANIOTOMY TUMOR EXCISION;  Surgeon: Hosie Spangle, MD;  Location: [place] NEURO ORS;  Service: Neurosurgery;  Laterality: N/A;  Craniotomy for tumor resection  . ESOPHAGOGASTRODUODENOSCOPY  12/29/2010   Rourk-Retained food in the esophagus and stomach, small hiatal hernia, status post Maloney dilation of the esophagus  . ESOPHAGOGASTRODUODENOSCOPY N/A 09/25/2012   [id number] atonic baggy esophagus status post Maloney dilation 67 F. Hiatal hernia  . GIVENS CAPSULE STUDY N/A 01/15/2013   NORMAL.   . IR GENERIC HISTORICAL  03/17/2016   IR RADIOLOGIST EVAL & MGMT 03/17/2016 [facility]-INTERV RAD  . LESION REMOVAL N/A 05/31/2015   Procedure: REMOVAL RIGHT AND LEFT LESIONS OF MANDIBLE;  Surgeon: Diona Browner, DDS;  Location: [place];  Service: Oral Surgery;  Laterality: N/A;  . MALONEY DILATION  12/29/2010   RMR;  . NM MYOCAR PERF WALL MOTION  2006   "relavtiely normal" persantine, mild anterior thinning (breast attenuation artifact),  no region of scar/ischemia  . OVARIAN CYST REMOVAL    . RECTOCELE REPAIR N/A 06/29/2015   Procedure: POSTERIOR REPAIR (RECTOCELE);  Surgeon: Jonnie Kind, MD;  Location: [facility];  Service: Gynecology;  Laterality: N/A;  . REDUCTION MAMMAPLASTY Bilateral   . SPINE SURGERY  09/29/2010   Dr. Rolena Infante  . surgical excision of 3 tumors from right thigh and right buttock  and left upper thigh  2010  . TOOTH EXTRACTION Bilateral 12/14/2014   Procedure: REMOVAL OF BILATERAL MANDIBULAR EXOSTOSES;  Surgeon: Diona Browner, DDS;  Location: [place];  Service: Oral Surgery;  Laterality: Bilateral;  . TRANSTHORACIC ECHOCARDIOGRAM  2010   EF 60-65%, mild conc LVH, grade 1 diastolic dysfunction; mildly calcified MV annulus with mildly thickened leaflets, mildly calcified MR annulus    FAMILY HISTORY: Family History  Problem Relation Age of Onset  . Heart attack Mother        HTN  . Pneumonia Father   . Kidney failure Father   . Diabetes Father   . Pancreatic cancer Sister   . Diabetes Brother   . Hypertension Brother   . Diabetes Brother   . Cancer Sister        breast   . Hypertension Son   . Sleep apnea Son   . Cancer Sister        pancreatic  . Stroke Maternal Grandmother   . Heart attack Maternal Grandfather   . Alcohol abuse Maternal Uncle   . Colon cancer Neg Hx   . Anesthesia problems Neg Hx   . Hypotension Neg Hx   . Malignant hyperthermia Neg Hx   . Pseudochol deficiency Neg Hx     SOCIAL HISTORY: Social History   Socioeconomic History  . Marital status: Divorced    Spouse name: Not on file  . Number of children: 1  . Years of education: 1  . Highest education level: Not on file  Occupational History  . Occupation: Disabled  Social Needs  . Financial resource strain: Not very hard  . Food insecurity:    Worry: Never true    Inability: Never true  . Transportation needs:    Medical: No    Non-medical: Not on file  Tobacco Use  . Smoking status: Current Every Day Smoker     Packs/day: 0.50    Types: Cigarettes  . Smokeless tobacco: Never Used  . Tobacco comment: has quit for now   Substance and Sexual Activity  . Alcohol use: No    Alcohol/week: 0.0 standard drinks    Comment:    . Drug use: No  . Sexual activity: Not Currently  Lifestyle  . Physical activity:    Days per week: Not on file    Minutes per session: Not on file  . Stress: Not on file  Relationships  . Social connections:    Talks on phone: Not on file    Gets together: Not on file    Attends religious service: Not on file    Active member of club or organization: Not on file    Attends meetings of clubs or organizations: Not on file    Relationship status: Not on file  . Intimate partner violence:    Fear of current or ex partner: Not on file    Emotionally abused:  Not on file    Physically abused: Not on file    Forced sexual activity: Not on file  Other Topics Concern  . Not on file  Social History Narrative   01/29/18 Lives alone, has 3 aides, Mon- Fri 8 hrs, 2 hrs on Sat-Sun, RN manages her meds   Caffeine use: Drink coffee sometimes    Right handed       PHYSICAL EXAM  Vitals:   01/29/18 1244  BP: 120/60  Pulse: 74  Weight: 164 lb (74.4 kg)  Height: 4\' 11"  (1.499 m)   Body mass index is 33.12 kg/m.  Generalized: Well developed, in no acute distress   Neurological examination  Mentation: Alert oriented to time, place, history taking. Follows all commands speech and language fluent Cranial nerve II-XII: Pupils were equal round reactive to light. Extraocular movements were full, visual field were full on confrontational test. Facial sensation and strength were normal. Uvula tongue midline. Head turning and shoulder shrug  were normal and symmetric. Motor: The motor testing reveals 5 over 5 strength of all 4 extremities. Good symmetric motor tone is noted throughout.  Sensory: Sensory testing is intact to soft touch on all 4 extremities. No evidence of  extinction is noted.  Coordination: Cerebellar testing reveals good finger-nose-finger and heel-to-shin bilaterally.  Gait and station: Patient uses a Rollator when ambulating.  Tandem gait not attempted.   DIAGNOSTIC DATA (LABS, IMAGING, TESTING) - I reviewed patient records, labs, notes, testing and imaging myself where available.  Lab Results  Component Value Date   WBC 8.9 11/01/2017   HGB 10.7 (L) 11/01/2017   HCT 35.1 (L) 11/01/2017   MCV 77.3 (L) 11/01/2017   PLT 213 11/01/2017      Component Value Date/Time   NA 140 11/01/2017 0147   NA 144 02/05/2017 1142   K 4.5 11/01/2017 0147   CL 105 11/01/2017 0147   CO2 25 11/01/2017 0147   GLUCOSE 148 (H) 11/01/2017 0147   BUN 25 (H) 11/01/2017 0147   BUN 11 02/05/2017 1142   CREATININE 0.80 01/23/2018 1407   CREATININE 0.90 07/19/2015 0925   CALCIUM 9.5 11/01/2017 0147   PROT 7.5 11/01/2017 0147   PROT 7.0 02/05/2017 1142   ALBUMIN 4.3 11/01/2017 0147   ALBUMIN 4.4 02/05/2017 1142   AST 32 11/01/2017 0147   ALT 28 11/01/2017 0147   ALKPHOS 92 11/01/2017 0147   BILITOT 0.5 11/01/2017 0147   BILITOT 0.3 02/05/2017 1142   GFRNONAA 49 (L) 11/01/2017 0147   GFRNONAA 67 07/19/2015 0925   GFRAA 56 (L) 11/01/2017 0147   GFRAA 78 07/19/2015 0925   Lab Results  Component Value Date   CHOL 175 02/05/2017   HDL 67 02/05/2017   LDLCALC 87 02/05/2017   TRIG 105 02/05/2017   CHOLHDL 3.1 07/19/2015   Lab Results  Component Value Date   HGBA1C 5.8 (A) 10/09/2017   Lab Results  Component Value Date   VITAMINB12 1,616 (H) 10/28/2017   Lab Results  Component Value Date   TSH 0.52 10/08/2017      ASSESSMENT AND PLAN 67 y.o. year old female  has a past medical history of Anemia, Anxiety, Arthritis, Assistance needed for mobility, Bipolar disorder ([place]), Blood transfusion, Cancer ([place]), Carpal tunnel syndrome of right wrist (05/23/2011), Cervical disc disorder with radiculopathy of cervical region (10/31/2012), Chronic back  pain, Chronic idiopathic constipation, Chronic neck and back pain, Colon polyps, COPD (chronic obstructive pulmonary disease) with chronic bronchitis ([place]) (09/16/2013), Depression, Diabetes mellitus,  Diverticulosis, Fibromyalgia, Frequent falls, GERD (gastroesophageal reflux disease), Glaucoma, Gum symptoms, Hemiplegia affecting non-dominant side, post-stroke (08/02/2011), Hyperlipidemia, Hypertension, Hypothyroidism, IBS (irritable bowel syndrome), Insomnia, Malignant hyperpyrexia (7/71/1657), Metabolic encephalopathy (11/14/8331), Migraines, Mononeuritis lower limb, Narcolepsy, Osteoporosis, Pancreatitis (2006), Schatzki's ring, Seizures ([place]), Sleep apnea, Stroke ([place]), and Tubular adenoma of colon. here with :  1.  Seizures  Overall the patient has done well.  She will continue on Lamictal.  I will check blood work today.  She is advised that if her symptoms worsen or she develops new symptoms she should let us know.  She will follow-up in 1 year or sooner if needed.   I spent 15 minutes with the patient. 50% of this time was spent reviewing plan of care   Ward Givens, MSN, NP-C 01/29/2018, 12:57 PM [facility] [address] [place], [place] [address] [phone]

## 2018-01-29 NOTE — Patient Instructions (Signed)
Your Plan:  Continue Lamictal  Blood work today If you have any seizure events please let us know If your symptoms worsen or you develop new symptoms please let us know.   Thank you for coming to see [name] at [facility]. I hope we have been able to provide you high quality care today.  You may receive a patient satisfaction survey over the next few weeks. We would appreciate your feedback and comments so that we may continue to improve ourselves and the health of our patients.

## 2018-01-29 NOTE — Progress Notes (Signed)
I have read the note, and I agree with the clinical assessment and plan.  Sheniece Ruggles K Akeel Reffner   

## 2018-01-30 ENCOUNTER — Ambulatory Visit (INDEPENDENT_AMBULATORY_CARE_PROVIDER_SITE_OTHER): Payer: Medicare HMO | Admitting: Psychiatry

## 2018-01-30 DIAGNOSIS — F331 Major depressive disorder, recurrent, moderate: Secondary | ICD-10-CM | POA: Diagnosis not present

## 2018-01-30 DIAGNOSIS — I69359 Hemiplegia and hemiparesis following cerebral infarction affecting unspecified side: Secondary | ICD-10-CM | POA: Diagnosis not present

## 2018-01-30 DIAGNOSIS — E119 Type 2 diabetes mellitus without complications: Secondary | ICD-10-CM | POA: Diagnosis not present

## 2018-01-30 DIAGNOSIS — M6281 Muscle weakness (generalized): Secondary | ICD-10-CM | POA: Diagnosis not present

## 2018-01-30 DIAGNOSIS — Z9181 History of falling: Secondary | ICD-10-CM | POA: Diagnosis not present

## 2018-01-30 NOTE — Progress Notes (Signed)
   THERAPIST PROGRESS NOTE  Session Time: Wednesday 01/30/2018 3:08 PM - 3:55 PM   Participation Level: Active  Behavioral Response: CasualAlertDepressed  Type of Therapy: Individual Therapy  Treatment Goals addressed: Learn and implement behavioral strategies to overcome depression.                                                   Verbalize an understanding and resolution of current interpersonal problems.   Interventions: CBT and Supportive  Summary: [name] is a 67 y.o. female who presents with  long standing history of recurrent periods  of depression beginning when she was 89 and her favorite uncle died. Patient reports multiple psychiatric hospitlaizations due to depression and suicidal ideations with the last one occuring in 1997. Patient has participated in outpatient psychotherapy and medication management intermittently since age 37. She currently is seeing psychiatrist Dr. Harrington Challenger . Prior to this, she was seen at [facility]. Patient also has had E[facility] Symptoms have worsened in recent months due to family stress and have  included depressed mood, anxiety, excessive worry, and tearfulness.   Patient last was seen 2 - 3 weeks ago. She reports depressed mood and  increased ruminating thoughts about the upcoming holidays. She reports history of being invited to various family members' home as well as her son's home for holiday events.  She expresses frustration and disappointment as she reports she normally feels alone and ignored during these events. She also reports family members aren't sensitive to her health condition and dietary needs. She expresses anger and frustration family members don't celebrate Thanksgiving in the way she thinks they should.  She reports she has tried to express concerns and needs but says people ignore this or become upset. Patient states being more comfortable at home.    Suicidal/Homicidal: Nowithout intent/plan  Therapist Response:  Discussed stressors, facilitated expression of thoughts and feelings, validated feelings, assisted patient examine her pattern of interaction and communication with family, discussed aggressive versus assertive communication, assisted patient explore her options regarding celebrating the holidays and ways to maintain connection with family/friends  Plan: Return again in 2 weeks.  Diagnosis: Axis I: MDD, Recurrent, moderate    Axis II: Deferred    Mccrae Speciale, LCSW 01/30/2018

## 2018-01-31 ENCOUNTER — Encounter: Payer: Self-pay | Admitting: Family Medicine

## 2018-01-31 ENCOUNTER — Ambulatory Visit (INDEPENDENT_AMBULATORY_CARE_PROVIDER_SITE_OTHER): Payer: Medicare HMO | Admitting: Family Medicine

## 2018-01-31 ENCOUNTER — Ambulatory Visit: Payer: Self-pay | Admitting: Cardiology

## 2018-01-31 ENCOUNTER — Telehealth: Payer: Self-pay | Admitting: *Deleted

## 2018-01-31 ENCOUNTER — Ambulatory Visit: Payer: Medicare HMO

## 2018-01-31 VITALS — BP 158/80 | HR 76 | Resp 16 | Ht 59.0 in | Wt 163.0 lb

## 2018-01-31 VITALS — BP 136/66 | HR 76 | Resp 12 | Ht 59.0 in | Wt 163.0 lb

## 2018-01-31 DIAGNOSIS — Z0000 Encounter for general adult medical examination without abnormal findings: Secondary | ICD-10-CM

## 2018-01-31 DIAGNOSIS — R0989 Other specified symptoms and signs involving the circulatory and respiratory systems: Secondary | ICD-10-CM | POA: Diagnosis not present

## 2018-01-31 DIAGNOSIS — E785 Hyperlipidemia, unspecified: Secondary | ICD-10-CM

## 2018-01-31 DIAGNOSIS — Z720 Tobacco use: Secondary | ICD-10-CM | POA: Diagnosis not present

## 2018-01-31 DIAGNOSIS — Z794 Long term (current) use of insulin: Secondary | ICD-10-CM

## 2018-01-31 DIAGNOSIS — Z79899 Other long term (current) drug therapy: Secondary | ICD-10-CM

## 2018-01-31 DIAGNOSIS — E1165 Type 2 diabetes mellitus with hyperglycemia: Secondary | ICD-10-CM

## 2018-01-31 LAB — CBC WITH DIFFERENTIAL/PLATELET
BASOS ABS: 0 10*3/uL (ref 0.0–0.2)
Basos: 0 %
EOS (ABSOLUTE): 0.2 10*3/uL (ref 0.0–0.4)
Eos: 3 %
Hematocrit: 33.8 % — ABNORMAL LOW (ref 34.0–46.6)
Hemoglobin: 10.3 g/dL — ABNORMAL LOW (ref 11.1–15.9)
Immature Grans (Abs): 0.1 10*3/uL (ref 0.0–0.1)
Immature Granulocytes: 1 %
LYMPHS ABS: 2.4 10*3/uL (ref 0.7–3.1)
Lymphs: 32 %
MCH: 22.9 pg — AB (ref 26.6–33.0)
MCHC: 30.5 g/dL — ABNORMAL LOW (ref 31.5–35.7)
MCV: 75 fL — ABNORMAL LOW (ref 79–97)
MONOS ABS: 0.7 10*3/uL (ref 0.1–0.9)
Monocytes: 10 %
NEUTROS ABS: 4.1 10*3/uL (ref 1.4–7.0)
Neutrophils: 54 %
PLATELETS: 275 10*3/uL (ref 150–450)
RBC: 4.49 x10E6/uL (ref 3.77–5.28)
RDW: 12.8 % (ref 12.3–15.4)
WBC: 7.5 10*3/uL (ref 3.4–10.8)

## 2018-01-31 LAB — COMPREHENSIVE METABOLIC PANEL
A/G RATIO: 1.6 (ref 1.2–2.2)
ALBUMIN: 4 g/dL (ref 3.6–4.8)
ALK PHOS: 117 IU/L (ref 39–117)
ALT: 20 IU/L (ref 0–32)
AST: 23 IU/L (ref 0–40)
BUN/Creatinine Ratio: 25 (ref 12–28)
BUN: 29 mg/dL — ABNORMAL HIGH (ref 8–27)
CHLORIDE: 105 mmol/L (ref 96–106)
CO2: 19 mmol/L — ABNORMAL LOW (ref 20–29)
Calcium: 9.2 mg/dL (ref 8.7–10.3)
Creatinine, Ser: 1.17 mg/dL — ABNORMAL HIGH (ref 0.57–1.00)
GFR calc non Af Amer: 48 mL/min/{1.73_m2} — ABNORMAL LOW (ref >59)
GFR, EST AFRICAN AMERICAN: 56 mL/min/{1.73_m2} — AB (ref >59)
Globulin, Total: 2.5 g/dL (ref 1.5–4.5)
Glucose: 101 mg/dL — ABNORMAL HIGH (ref 65–99)
POTASSIUM: 3.8 mmol/L (ref 3.5–5.2)
Sodium: 142 mmol/L (ref 134–144)
TOTAL PROTEIN: 6.5 g/dL (ref 6.0–8.5)

## 2018-01-31 LAB — LAMOTRIGINE LEVEL: LAMOTRIGINE LVL: 11.7 ug/mL (ref 2.0–20.0)

## 2018-01-31 MED ORDER — CLONIDINE HCL 0.2 MG PO TABS: ORAL_TABLET | ORAL | 3 refills | Status: DC

## 2018-01-31 NOTE — Progress Notes (Signed)
Subjective:   [name] is a 67 y.o. female who presents for Medicare Annual (Subsequent) preventive examination.  Review of Systems:   Cardiac Risk Factors include: advanced age (>75men, >35 women);hypertension;diabetes mellitus;smoking/ tobacco exposure;obesity (BMI >30kg/m2);dyslipidemia     Objective:     Vitals: BP 136/66   Pulse 76   Resp 12   Ht 4\' 11"  (1.499 m)   Wt 163 lb (73.9 kg)   SpO2 94%   BMI 32.92 kg/m   Body mass index is 32.92 kg/m.  Advanced Directives 01/31/2018 11/01/2017 10/31/2017 10/30/2017 10/28/2017 10/28/2017 10/23/2017  Does Patient Have a Medical Advance Directive? No No No No No No No  Type of Advance Directive - - - - - - -  Does patient want to make changes to medical advance directive? - - - - - - -  Copy of [place] in Chart? - - - - - - -  Would patient like information on creating a medical advance directive? Yes (ED - Information included in AVS) - No - Patient declined No - Patient declined No - Patient declined - No - Patient declined  Pre-existing out of facility DNR order (yellow form or pink MOST form) - - - - - - -    Tobacco Social History   Tobacco Use  Smoking Status Current Every Day Smoker  . Packs/day: 0.50  . Types: Cigarettes  Smokeless Tobacco Never Used     Ready to quit: No Counseling given: Yes   Clinical Intake:  Pre-visit preparation completed: Yes  Pain : 0-10 Pain Score: 6  Pain Type: Chronic pain Pain Location: Back Pain Orientation: Lower Pain Descriptors / Indicators: Aching Pain Onset: More than a month ago Pain Frequency: Constant Pain Relieving Factors: percocet   Pain Relieving Factors: percocet   BMI - recorded: 32.9 Nutritional Status: BMI > 30  Obese Nutritional Risks: None Diabetes: Yes CBG done?: No Did pt. bring in CBG monitor from home?: No  How often do you need to have someone help you when you read instructions, pamphlets, or other written materials from  your doctor or pharmacy?: 4 - Often What is the last grade level you completed in school?: 12 grade   Interpreter Needed?: No  Information entered by :: Francena Hanly LPN   Past Medical History:  Diagnosis Date  . Allergy   . Anemia   . Anxiety    takes Ativan daily  . Arthritis   . Assistance needed for mobility   . Bipolar disorder ([place])    takes Risperdal nightly  . Blood transfusion   . Cancer ([place])    In her gum  . Carpal tunnel syndrome of right wrist 05/23/2011  . Cervical disc disorder with radiculopathy of cervical region 10/31/2012  . Chronic back pain   . Chronic idiopathic constipation   . Chronic neck and back pain   . Colon polyps   . COPD (chronic obstructive pulmonary disease) with chronic bronchitis ([place]) 09/16/2013   Office Spirometry 10/30/2013-submaximal effort based on appearance of loop and curve. Numbers would fit with severe restriction but her physiologic capability may be better than this. FVC 0.91/44%, and 10.74/45%, FEV1/FVC 0.81, FEF 25-75% 1.43/69%    . Depression    takes Zoloft daily  . Diabetes mellitus    Type II  . Diverticulosis    TCS 9/08 by Dr. Delfin Edis for diarrhea . Bx for micro scopic colitis negative.   . Fibromyalgia   . Frequent  falls   . GERD (gastroesophageal reflux disease)    takes Aciphex daily  . Glaucoma    eye drops daily  . Gum symptoms    infection on antibiotic  . Hemiplegia affecting non-dominant side, post-stroke 08/02/2011  . Hyperlipidemia    takes Crestor daily  . Hypertension    takes Amlodipine,Metoprolol,and Clonidine daily  . Hypothyroidism    takes Synthroid daily  . IBS (irritable bowel syndrome)   . Insomnia    takes Trazodone nightly  . 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 Metabolic encephalopathy 10/05/3662  . Migraines    chronic headaches  . Mononeuritis lower limb   . Narcolepsy   . Osteoporosis   . Pancreatitis 2006   due to Depakote with normal EUS   . Schatzki's ring    non critical /  EGD with ED 8/2011with RMR  . Seizures ([place])    takes Lamictal daily.Last seizure 3 yrs ago  . Sleep apnea    on CPAP  . Stroke [facility])    left sided weakness, speech changes  . Tubular adenoma of colon    Past Surgical History:  Procedure Laterality Date  . ABDOMINAL HYSTERECTOMY  1978  . BACK SURGERY  July 2012  . BACTERIAL OVERGROWTH TEST N/A 05/05/2013   Procedure: BACTERIAL OVERGROWTH TEST;  Surgeon: Daneil Dolin, MD;  [facility];  Service: Endoscopy;  Laterality: N/A;  7:30  . BIOPSY THYROID  2009  . BRAIN SURGERY  11/2011   resection of meningioma  . BREAST REDUCTION SURGERY  1994  . CARDIAC CATHETERIZATION  05/10/2005   normal coronaries, normal LV systolic function and EF (Dr. Jackie Plum)  . CARPAL TUNNEL RELEASE Left 07/22/04   Dr. Aline Brochure  . CATARACT EXTRACTION Bilateral   . CHOLECYSTECTOMY  1984  . COLONOSCOPY N/A 09/25/2012   QIH:[id number] diverticulosis.  colonic polyp-removed : tubular adenoma  . CRANIOTOMY  11/23/2011   Procedure: CRANIOTOMY TUMOR EXCISION;  Surgeon: Hosie Spangle, MD;  Location: [place] [facility]UR[facility] ORS;  Service: Neurosurgery;  Laterality: N/A;  Craniotomy for tumor resection  . ESOPHAGOGASTRODUODENOSCOPY  12/29/2010   Rourk-Retained food in the esophagus and stomach, small hiatal hernia, status post Maloney dilation of the esophagus  . ESOPHAGOGASTRODUODENOSCOPY N/A 09/25/2012   GLO:VFIEPPIR atonic baggy esophagus status post Maloney dilation 20 F. Hiatal hernia  . GIVENS CAPSULE STUDY N/A 01/15/2013   NORMAL.   . IR GENERIC HISTORICAL  03/17/2016   IR RADIOLOGIST EVAL & MGMT 03/17/2016 [facility]-INTERV RAD  . LESION REMOVAL N/A 05/31/2015   Procedure: REMOVAL RIGHT AND LEFT LESIONS OF MANDIBLE;  Surgeon: Diona Browner, DDS;  Location: [place];  Service: Oral Surgery;  Laterality: N/A;  . MALONEY DILATION  12/29/2010   RMR;  . NM MYOCAR PERF WALL MOTION  2006   "relavtiely normal" persantine, mild anterior thinning (breast attenuation artifact), no region of  scar/ischemia  . OVARIAN CYST REMOVAL    . RECTOCELE REPAIR N/A 06/29/2015   Procedure: POSTERIOR REPAIR (RECTOCELE);  Surgeon: Jonnie Kind, MD;  Location: [facility];  Service: Gynecology;  Laterality: N/A;  . REDUCTION MAMMAPLASTY Bilateral   . SPINE SURGERY  09/29/2010   Dr. Rolena Infante  . surgical excision of 3 tumors from right thigh and right buttock  and left upper thigh  2010  . TOOTH EXTRACTION Bilateral 12/14/2014   Procedure: REMOVAL OF BILATERAL MANDIBULAR EXOSTOSES;  Surgeon: Diona Browner, DDS;  Location: [facility];  Service: Oral Surgery;  Laterality: Bilateral;  . TRANSTHORACIC ECHOCARDIOGRAM  2010  EF 60-65%, mild conc LVH, grade 1 diastolic dysfunction; mildly calcified MV annulus with mildly thickened leaflets, mildly calcified MR annulus   Family History  Problem Relation Age of Onset  . Heart attack Mother        HTN  . Pneumonia Father   . Kidney failure Father   . Diabetes Father   . Pancreatic cancer Sister   . Diabetes Brother   . Hypertension Brother   . Diabetes Brother   . Cancer Sister        breast   . Hypertension Son   . Sleep apnea Son   . Cancer Sister        pancreatic  . Stroke Maternal Grandmother   . Heart attack Maternal Grandfather   . Alcohol abuse Maternal Uncle   . Colon cancer Neg Hx   . Anesthesia problems Neg Hx   . Hypotension Neg Hx   . Malignant hyperthermia Neg Hx   . Pseudochol deficiency Neg Hx    Social History   Socioeconomic History  . Marital status: Divorced    Spouse name: Not on file  . Number of children: 1  . Years of education: 10  . Highest education level: Not on file  Occupational History  . Occupation: Disabled  Social Needs  . Financial resource strain: Not very hard  . Food insecurity:    Worry: Never true    Inability: Never true  . Transportation needs:    Medical: No    Non-medical: Not on file  Tobacco Use  . Smoking status: Current Every Day Smoker    Packs/day: 0.50    Types: Cigarettes  .  Smokeless tobacco: Never Used  Substance and Sexual Activity  . Alcohol use: No    Alcohol/week: 0.0 standard drinks    Comment:    . Drug use: No  . Sexual activity: Not Currently  Lifestyle  . Physical activity:    Days per week: 0 days    Minutes per session: 0 min  . Stress: Only a little  Relationships  . Social connections:    Talks on phone: More than three times a week    Gets together: Never    Attends religious service: 1 to 4 times per year    Active member of club or organization: No    Attends meetings of clubs or organizations: Never    Relationship status: Divorced  Other Topics Concern  . Not on file  Social History Narrative   01/29/18 Lives alone, has 3 aides, Mon- Fri 8 hrs, 2 hrs on Sat-Sun, RN manages her meds   Caffeine use: Drink coffee sometimes    Right handed     Outpatient Encounter Medications as of 01/31/2018  Medication Sig  . ACCU-CHEK FASTCLIX LANCETS MISC Use to check blood sugar 3 times a day.  [name] ACCU-CHEK SMARTVIEW test strip TEST BLOOD SUGAR FOUR TIMES DAILY AS DIRECTED  . albuterol (PROVENTIL HFA;VENTOLIN HFA) 108 (90 Base) MCG/ACT inhaler Inhale 1-2 puffs every 6 hours as needed for wheezing, shortness of breath  . amLODipine (NORVASC) 5 MG tablet Take 1 tablet (5 mg total) by mouth daily.  [name] ascorbic acid (VITAMIN C) 500 MG tablet Take 500 mg by mouth daily.  [name] aspirin EC 81 MG tablet Take 81 mg by mouth daily.  . Blood Glucose Monitoring Suppl (ACCU-CHEK AVIVA) device Use as instructed  . cetirizine (ZYRTEC) 10 MG tablet Take 1 tablet (10 mg total) by mouth daily.  . Cholecalciferol (  VITAMIN D3) 5000 units CAPS Take 1 capsule by mouth daily.  . cloNIDine (CATAPRES) 0.3 MG tablet One tablet once daily at bedtime only for blood pressure  . docusate sodium (COLACE) 100 MG capsule Take 100 mg by mouth 2 (two) times daily.  . DROPLET PEN NEEDLES 32G X 4 MM MISC USE  TO INJECT FOUR TIMES DAILY  . hydroxychloroquine (PLAQUENIL) 200 MG tablet  Take 200 mg by mouth daily.   . insulin aspart (NOVOLOG FLEXPEN) 100 UNIT/ML FlexPen INJECT 10 TO 14 UNITS INTO THE SKIN 3 (THREE) TIMES DAILY WITH MEALS.  [name] Insulin Glargine (TOUJEO SOLOSTAR) 300 UNIT/ML SOPN Inject 20 Units into the skin at bedtime.  . lamoTRIgine (LAMICTAL) 100 MG tablet TAKE 1 TABLET TWICE DAILY  . levothyroxine (SYNTHROID, LEVOTHROID) 50 MCG tablet TAKE 1 TABLET  DAILY  EXCEPT TAKE 1/2 TABLET  ON  SUNDAY  . LORazepam (ATIVAN) 0.5 MG tablet Take 1 tablet (0.5 mg total) by mouth 2 (two) times daily.  [name] losartan (COZAAR) 50 MG tablet Take 1 tablet (50 mg total) by mouth daily.  . metoprolol tartrate (LOPRESSOR) 50 MG tablet TAKE 1 TABLET TWICE DAILY (NEED MD APPOINTMENT)  . montelukast (SINGULAIR) 10 MG tablet TAKE 1 TABLET EVERY DAY  . Multiple Vitamins-Minerals (HM MULTIVITAMIN ADULT GUMMY PO) Take 1 tablet by mouth daily.  [name] nystatin (MYCOSTATIN/NYSTOP) powder APPLY TO AFFECTED AREA 4 TIMES DAILY.  [name] oxyCODONE-acetaminophen (PERCOCET/ROXICET) 5-325 MG tablet Take 1 tablet by mouth every 6 (six) hours as needed.   . pregabalin (LYRICA) 75 MG capsule Take 1 capsule (75 mg total) by mouth daily.  . RABEprazole (ACIPHEX) 20 MG tablet Take 1 tablet (20 mg total) by mouth 2 (two) times daily.  . RESTASIS 0.05 % ophthalmic emulsion Place 1 drop into both eyes 2 (two) times daily.   . risperiDONE (RISPERDAL) 0.5 MG tablet Take 1 tablet (0.5 mg total) by mouth at bedtime.  . rosuvastatin (CRESTOR) 5 MG tablet TAKE 1 TABLET AT BEDTIME  . sertraline (ZOLOFT) 100 MG tablet Take 1 tablet (100 mg total) by mouth daily.  . traZODone (DESYREL) 50 MG tablet Take 1 tablet (50 mg total) by mouth at bedtime.  . triamcinolone ointment (KENALOG) 0.1 % Apply 1 application topically at bedtime. Apply to elbows at bedtime.  [name] UNABLE TO FIND Transport Chair x 1  Dx-bilateral leg weakness, frequent falls  . UNABLE TO FIND Rolling walker with seat x 1  DX. Bilateral lower extremity weakness,  frequent falls, instability  . diclofenac sodium (VOLTAREN) 1 % GEL Apply 2 g topically daily as needed (pain). Rubs "wherever there is pain" when she doesn't take her pain pill. (Patient not taking: Reported on 01/31/2018)  . Nicotine (NICOTROL NS) 10 MG/ML SOLN Use 1 spray per nostril up to eight times per day as needed (Patient not taking: Reported on 01/31/2018)   No facility-administered encounter medications on file as of 01/31/2018.     Activities of Daily Living In your present state of health, do you have any difficulty performing the following activities: 01/31/2018 10/30/2017  Hearing? [place]? Y -  Difficulty concentrating or making decisions? Y -  Walking or climbing stairs? Y -  Dressing or bathing? Y -  Doing errands, shopping? Y N  Preparing Food and eating ? Y -  Using the Toilet? N -  In the past six months, have you accidently leaked urine? Y -  Do you have problems with loss of bowel control? N -  Managing your Medications? Y -  Managing your Finances? Y -  Housekeeping or managing your Housekeeping? Y -  Some recent data might be hidden    Patient Care Team: [name], MD as PCP - General Herminio Commons, MD as PCP - Cardiology (Cardiology) Gala Romney Cristopher Estimable, MD (Gastroenterology) Deneise Lever, MD as Consulting Physician (Pulmonary Disease) Jovita Gamma, MD as Consulting Physician (Neurosurgery) Suella Broad, MD as Consulting Physician (Physical Medicine and Rehabilitation) Pieter Partridge, DO as Consulting Physician (Neurology) Lavonna Monarch, MD as Consulting Physician (Dermatology) Leta Baptist, MD as Consulting Physician (Otolaryngology) Latanya Maudlin, MD as Consulting Physician (Orthopedic Surgery) Cloria Spring, MD as Consulting Physician ([place]) Melina Schools, MD as Consulting Physician (Orthopedic Surgery) Lendon Colonel, NP as Nurse Practitioner (Nurse Practitioner) Milus Banister, MD as Attending  Physician (Gastroenterology) Pyrtle, Lajuan Lines, MD as Consulting Physician (Gastroenterology) Monico Hoar, PT as Physical Therapist (Physical Therapy) Conrad Riverbend, MD as Consulting Physician (Vascular Surgery) Inocencio Homes, DPM as Consulting Physician (Podiatry) Newt Minion, MD as Consulting Physician (Orthopedic Surgery) [name], RN as [place] Management    Assessment:   This is a routine wellness examination for [facility].  Exercise Activities and Dietary recommendations Current Exercise Habits: The patient does not participate in regular exercise at present, Exercise limited by: orthopedic condition(s);psychological condition(s);cardiac condition(s);respiratory conditions(s)  Goals    . Exercise 3x per week (30 min per time)     Recommend starting chair exercises at least 3 days a week for 30-45 minutes at a time as tolerated.      . Quit smoking / using tobacco       Fall Risk Fall Risk  01/31/2018 01/29/2018 12/31/2017 12/07/2017 11/20/2017  Falls in the past year? 1 1 Yes Yes -  Number falls in past yr: 1 1 2  or more 1 2 or more  Comment - - - bruised -  Injury with Fall? 1 1 - Yes Yes  Comment - bruises - - -  Risk Factor Category  - - High Fall Risk High Fall Risk -  Risk for fall due to : History of fall(s);Medication side effect;Impaired balance/gait;Impaired mobility;Impaired vision Impaired balance/gait;Impaired mobility - History of fall(s) -  Follow up Falls prevention discussed - - Education provided -   Is the patient's home free of loose throw rugs in walkways, pet beds, electrical cords, etc?   no      Grab bars in the bathroom? yes      Handrails on the stairs?   yes      Adequate lighting?   yes  Timed Get Up and Go performed: Patient able to perform in 8 seconds with a walker   Depression Screen PHQ 2/9 Scores 01/31/2018 11/20/2017 10/08/2017 08/08/2017  PHQ - 2 Score 1 3 3 2   PHQ- 9 Score - 6 16 5   Some encounter information is  confidential and restricted. Go to Review Flowsheets activity to see all data.     Cognitive Function MMSE - Mini Mental State Exam 03/20/2016 09/08/2015 07/02/2014 12/08/2013  Not completed: (No Data) - - -  Orientation to time - 5 5 5   Orientation to Place - 3 5 5   Registration - 2 3 2   Attention/ Calculation - 5 3 5   Recall - 2 3 2   Language- name 2 objects - 2 2 2   Language- repeat - 0 1 1  Language- follow 3 step command - 3 3 3  Language- read & follow direction - 1 1 1   Write a sentence - 1 0 1  Copy design - 0 0 0  Total score - 24 26 27      6CIT Screen 01/31/2018 12/06/2016  What Year? 0 points 0 points  What month? 0 points 0 points  What time? 0 points 0 points  Count back from 20 0 points 0 points  Months in reverse 0 points 0 points  Repeat phrase 0 points 0 points  Total Score 0 0    Immunization History  Administered Date(s) Administered  . H1N1 01/09/2008  . Influenza Split 12/12/2010, 11/16/2011  . Influenza Whole 11/23/2006, 12/10/2007, 12/11/2008, 11/16/2009  . Influenza,inj,Quad PF,6+ Mos 11/28/2012, 11/20/2013, 11/10/2014, 12/06/2015, 11/21/2016, 11/20/2017  . Pneumococcal Conjugate-13 09/16/2013  . Pneumococcal Polysaccharide-23 08/03/2003, 12/11/2008, 08/22/2010, 08/31/2015  . Td 09/23/2003  . Tdap 12/29/2011    Qualifies for Shingles Vaccine? Postponed   Screening Tests Health Maintenance  Topic Date Due  . OPHTHALMOLOGY EXAM  03/28/2018  . HEMOGLOBIN A1C  04/11/2018  . FOOT EXAM  11/26/2018  . MAMMOGRAM  09/11/2019  . COLONOSCOPY  02/09/2021  . TETANUS/TDAP  12/28/2021  . INFLUENZA VACCINE  Completed  . DEXA SCAN  Completed  . Hepatitis C Screening  Completed  . PNA vac Low Risk Adult  Completed    Cancer Screenings: Lung: Low Dose CT Chest recommended if Age 48-80 years, 30 pack-year currently smoking OR have quit w/in 15years. Patient does qualify. Breast:  Up to date on Mammogram? Yes   Up to date of Bone Density/Dexa?  Yes Colorectal: Up to date   Additional Screenings:  Hepatitis C Screening: complete      Plan:   Continue to try and quit smoking, try and exercise more.   I have personally reviewed and noted the following in the patient's chart:   . Medical and social history . Use of alcohol, tobacco or illicit drugs  . Current medications and supplements . Functional ability and status . Nutritional status . Physical activity . Advanced directives . List of other physicians . Hospitalizations, surgeries, and ER visits in previous 12 months . Vitals . Screenings to include cognitive, depression, and falls . Referrals and appointments  In addition, I have reviewed and discussed with patient certain preventive protocols, quality metrics, and best practice recommendations. A written personalized care plan for preventive services as well as general preventive health recommendations were provided to patient.     Hayden Pedro, LPN  [id number]

## 2018-01-31 NOTE — Patient Instructions (Signed)
[name] , Thank you for taking time to come for your Medicare Wellness Visit. I appreciate your ongoing commitment to your health goals. Please review the following plan we discussed and let me know if I can assist you in the future.   Screening recommendations/referrals: Colonoscopy: up to date  Mammogram: up to date  Bone Density: up to date  Recommended yearly ophthalmology/optometry visit for glaucoma screening and checkup Recommended yearly dental visit for hygiene and checkup  Vaccinations: Influenza vaccine: up to date  Pneumococcal vaccine: up to date  Tdap vaccine: up to date  Shingles vaccine: postponed     Advanced directives: information given   Conditions/risks identified: hypertension, chronic pain, diabetes, COPD,  Hypothyroid    Next appointment: Wellness in one year    Preventive Care 35 Years and Older, Female Preventive care refers to lifestyle choices and visits with your health care provider that can promote health and wellness. What does preventive care include?  A yearly physical exam. This is also called an annual well check.  Dental exams once or twice a year.  Routine eye exams. Ask your health care provider how often you should have your eyes checked.  Personal lifestyle choices, including:  Daily care of your teeth and gums.  Regular physical activity.  Eating a healthy diet.  Avoiding tobacco and drug use.  Limiting alcohol use.  Practicing safe sex.  Taking low-dose aspirin every day.  Taking vitamin and mineral supplements as recommended by your health care provider. What happens during an annual well check? The services and screenings done by your health care provider during your annual well check will depend on your age, overall health, lifestyle risk factors, and family history of disease. Counseling  Your health care provider may ask you questions about your:  Alcohol use.  Tobacco use.  Drug use.  Emotional  well-being.  Home and relationship well-being.  Sexual activity.  Eating habits.  History of falls.  Memory and ability to understand (cognition).  Work and work Statistician.  Reproductive health. Screening  You may have the following tests or measurements:  Height, weight, and BMI.  Blood pressure.  Lipid and cholesterol levels. These may be checked every 5 years, or more frequently if you are over 76 years old.  Skin check.  Lung cancer screening. You may have this screening every year starting at age 38 if you have a 30-pack-year history of smoking and currently smoke or have quit within the past 15 years.  Fecal occult blood test (FOBT) of the stool. You may have this test every year starting at age 67.  Flexible sigmoidoscopy or colonoscopy. You may have a sigmoidoscopy every 5 years or a colonoscopy every 10 years starting at age 67.  Hepatitis C blood test.  Hepatitis B blood test.  Sexually transmitted disease (STD) testing.  Diabetes screening. This is done by checking your blood sugar (glucose) after you have not eaten for a while (fasting). You may have this done every 1-3 years.  Bone density scan. This is done to screen for osteoporosis. You may have this done starting at age 63.  Mammogram. This may be done every 1-2 years. Talk to your health care provider about how often you should have regular mammograms. Talk with your health care provider about your test results, treatment options, and if necessary, the need for more tests. Vaccines  Your health care provider may recommend certain vaccines, such as:  Influenza vaccine. This is recommended every year.  Tetanus,  diphtheria, and acellular pertussis (Tdap, Td) vaccine. You may need a Td booster every 10 years.  Zoster vaccine. You may need this after age 48.  Pneumococcal 13-valent conjugate (PCV13) vaccine. One dose is recommended after age 55.  Pneumococcal polysaccharide (PPSV23) vaccine. One  dose is recommended after age 59. Talk to your health care provider about which screenings and vaccines you need and how often you need them. This information is not intended to replace advice given to you by your health care provider. Make sure you discuss any questions you have with your health care provider. Document Released: 03/26/2015 Document Revised: 11/17/2015 Document Reviewed: 12/29/2014 Elsevier Interactive Patient Education  2017 [employer] Prevention in the Home Falls can cause injuries. They can happen to people of all ages. There are many things you can do to make your home safe and to help prevent falls. What can I do on the outside of my home?  Regularly fix the edges of walkways and driveways and fix any cracks.  Remove anything that might make you trip as you walk through a door, such as a raised step or threshold.  Trim any bushes or trees on the path to your home.  Use bright outdoor lighting.  Clear any walking paths of anything that might make someone trip, such as rocks or tools.  Regularly check to see if handrails are loose or broken. Make sure that both sides of any steps have handrails.  Any raised decks and porches should have guardrails on the edges.  Have any leaves, snow, or ice cleared regularly.  Use sand or salt on walking paths during winter.  Clean up any spills in your garage right away. This includes oil or grease spills. What can I do in the bathroom?  Use night lights.  Install grab bars by the toilet and in the tub and shower. Do not use towel bars as grab bars.  Use non-skid mats or decals in the tub or shower.  If you need to sit down in the shower, use a plastic, non-slip stool.  Keep the floor dry. Clean up any water that spills on the floor as soon as it happens.  Remove soap buildup in the tub or shower regularly.  Attach bath mats securely with double-sided non-slip rug tape.  Do not have throw rugs and other  things on the floor that can make you trip. What can I do in the bedroom?  Use night lights.  Make sure that you have a light by your bed that is easy to reach.  Do not use any sheets or blankets that are too big for your bed. They should not hang down onto the floor.  Have a firm chair that has side arms. You can use this for support while you get dressed.  Do not have throw rugs and other things on the floor that can make you trip. What can I do in the kitchen?  Clean up any spills right away.  Avoid walking on wet floors.  Keep items that you use a lot in easy-to-reach places.  If you need to reach something above you, use a strong step stool that has a grab bar.  Keep electrical cords out of the way.  Do not use floor polish or wax that makes floors slippery. If you must use wax, use non-skid floor wax.  Do not have throw rugs and other things on the floor that can make you trip. What can I do with  my stairs?  Do not leave any items on the stairs.  Make sure that there are handrails on both sides of the stairs and use them. Fix handrails that are broken or loose. Make sure that handrails are as long as the stairways.  Check any carpeting to make sure that it is firmly attached to the stairs. Fix any carpet that is loose or worn.  Avoid having throw rugs at the top or bottom of the stairs. If you do have throw rugs, attach them to the floor with carpet tape.  Make sure that you have a light switch at the top of the stairs and the bottom of the stairs. If you do not have them, ask someone to add them for you. What else can I do to help prevent falls?  Wear shoes that:  Do not have high heels.  Have rubber bottoms.  Are comfortable and fit you well.  Are closed at the toe. Do not wear sandals.  If you use a stepladder:  Make sure that it is fully opened. Do not climb a closed stepladder.  Make sure that both sides of the stepladder are locked into place.  Ask  someone to hold it for you, if possible.  Clearly mark and make sure that you can see:  Any grab bars or handrails.  First and last steps.  Where the edge of each step is.  Use tools that help you move around (mobility aids) if they are needed. These include:  Canes.  Walkers.  Scooters.  Crutches.  Turn on the lights when you go into a dark area. Replace any light bulbs as soon as they burn out.  Set up your furniture so you have a clear path. Avoid moving your furniture around.  If any of your floors are uneven, fix them.  If there are any pets around you, be aware of where they are.  Review your medicines with your doctor. Some medicines can make you feel dizzy. This can increase your chance of falling. Ask your doctor what other things that you can do to help prevent falls. This information is not intended to replace advice given to you by your health care provider. Make sure you discuss any questions you have with your health care provider. Document Released: 12/24/2008 Document Revised: 08/05/2015 Document Reviewed: 04/03/2014 Elsevier Interactive Patient Education  2017 [employer].

## 2018-01-31 NOTE — Patient Instructions (Addendum)
F/U in 4.5 month, call if you need me before  Reduce clonidine dose to 0.2 mg one at bedtime, this is sent to [facility], you may take Half of the clonidine o.3 mg tablet until you get the new tablets    Microalb, lipid, and vit D today from the lab  Nicotrol is prescribed please do  NOT smoke when you use this   Steps to Quit Smoking Smoking tobacco can be bad for your health. It can also affect almost every organ in your body. Smoking puts you and people around you at risk for many serious long-lasting (chronic) diseases. Quitting smoking is hard, but it is one of the best things that you can do for your health. It is never too late to quit. What are the benefits of quitting smoking? When you quit smoking, you lower your risk for getting serious diseases and conditions. They can include:  Lung cancer or lung disease.  Heart disease.  Stroke.  Heart attack.  Not being able to have children (infertility).  Weak bones (osteoporosis) and broken bones (fractures).  If you have coughing, wheezing, and shortness of breath, those symptoms may get better when you quit. You may also get sick less often. If you are pregnant, quitting smoking can help to lower your chances of having a baby of low birth weight. What can I do to help me quit smoking? Talk with your doctor about what can help you quit smoking. Some things you can do (strategies) include:  Quitting smoking totally, instead of slowly cutting back how much you smoke over a period of time.  Going to in-person counseling. You are more likely to quit if you go to many counseling sessions.  Using resources and support systems, such as: ? Database administrator with a Social worker. ? Phone quitlines. ? Careers information officer. ? Support groups or group counseling. ? Text messaging programs. ? Mobile phone apps or applications.  Taking medicines. Some of these medicines may have nicotine in them. If you are pregnant or breastfeeding, do not  take any medicines to quit smoking unless your doctor says it is okay. Talk with your doctor about counseling or other things that can help you.  Talk with your doctor about using more than one strategy at the same time, such as taking medicines while you are also going to in-person counseling. This can help make quitting easier. What things can I do to make it easier to quit? Quitting smoking might feel very hard at first, but there is a lot that you can do to make it easier. Take these steps:  Talk to your family and friends. Ask them to support and encourage you.  Call phone quitlines, reach out to support groups, or work with a Social worker.  Ask people who smoke to not smoke around you.  Avoid places that make you want (trigger) to smoke, such as: ? Bars. ? Parties. ? Smoke-break areas at work.  Spend time with people who do not smoke.  Lower the stress in your life. Stress can make you want to smoke. Try these things to help your stress: ? Getting regular exercise. ? Deep-breathing exercises. ? Yoga. ? Meditating. ? Doing a body scan. To do this, close your eyes, focus on one area of your body at a time from head to toe, and notice which parts of your body are tense. Try to relax the muscles in those areas.  Download or buy apps on your mobile phone or tablet that can  help you stick to your quit plan. There are many free apps, such as QuitGuide from the CDC (Centers for Disease Control and Prevention). You can find more support from smokefree.gov and other websites.  This information is not intended to replace advice given to you by your health care provider. Make sure you discuss any questions you have with your health care provider. Document Released: 12/24/2008 Document Revised: 10/26/2015 Document Reviewed: 07/14/2014 Elsevier Interactive Patient Education  2018 [employer]  

## 2018-01-31 NOTE — Telephone Encounter (Signed)
Spoke with patient and informed her that her lab work is consistent with previous lab work. Advised her there is no change in her therapy. Patient verbalized understanding, appreciation.

## 2018-01-31 NOTE — Progress Notes (Signed)
    [name]     MRN: [id number]      DOB: 1950/11/03  HPI: Patient is in for annual physical exam. Blood pressure continues to fluctuate and isoften lower than it should be reportedly, appt with Cardiology is still upcoming Immunization is reviewed , and is up to date     PE: BP (!) 158/80   Pulse 76   Resp 16   Ht 4\' 11"  (1.499 m)   Wt 163 lb (73.9 kg)   SpO2 94%   BMI 32.92 kg/m   Pleasant  female, alert and oriented x 3, in no cardio-pulmonary distress. Afebrile. HEENT No facial trauma or asymetry. Sinuses non tender.  Extra occullar muscles intact,  External ears normal, tympanic membranes clear. Oropharynx moist, no exudate. Neck: supple, no adenopathy,JVD or thyromegaly.No bruits.  Chest: Clear to ascultation bilaterally.No crackles or wheezes. Non tender to palpation  Breast: No asymetry,no masses or lumps. No tenderness. No nipple discharge or inversion. No axillary or supraclavicular adenopathy  Cardiovascular system; Heart sounds normal,  S1 and  S2 ,no S3.  No murmur, or thrill. Apical beat not displaced Peripheral pulses normal.  Abdomen: Soft, non tender, no organomegaly or masses. No bruits. Bowel sounds normal. No guarding, tenderness or rebound.     Musculoskeletal exam: Decreased  ROM of spine, hips , shoulders and knees. No deformity ,swelling or crepitus noted. No muscle wasting or atrophy.   Neurologic: Cranial nerves 2 to 12 intact. Power, tone ,sensation and reflexes normal throughout. No disturbance in gait. No tremor.  Skin: Intact, no ulceration, erythema , scaling or rash noted. Pigmentation normal throughout  Psych; Normal mood and affect. Judgement and concentration normal   Assessment & Plan:  Tobacco user Asked:confirms currently smokes approximately 10 cigarettes daily Assess: Wants to quit and is  cutting back, will start using nicotrol inhaler and understands she cannot smoke while using this Advise: needs to  QUIT to reduce risk of cancer, cardio and cerebrovascular disease Assist: counseled for 5 minutes and literature provided Arrange: follow up in 3 months   Essential hypertension Labile, often hypotensive on current regime, Reduce cloniidine dose to 0.2 mg tablet, will benefit from, and needs ambulatory BP monitoring DASH diet and commitment to daily physical activity for a minimum of 30 minutes discussed and encouraged, as a part of hypertension management. The importance of attaining a healthy weight is also discussed.  BP/Weight 01/31/2018 01/31/2018 01/29/2018 01/25/2018 12/31/2017 12/24/2017 12/04/2681  Systolic BP 419 622 297 989 211 97 941  Diastolic BP 66 80 60 52 54 48 58  Wt. (Lbs) 163 163 164 164 157 - 158  BMI 32.92 32.92 33.12 33.12 31.71 - 31.91  Some encounter information is confidential and restricted. Go to Review Flowsheets activity to see all data.       Annual physical exam Annual exam as documented. Counseling done  re healthy lifestyle involving commitment to 150 minutes exercise per week, heart healthy diet, and attaining healthy weight.The importance of adequate sleep also discussed. Regular seat belt use and home safety, is also discussed. Changes in health habits are decided on by the patient with goals and time frames  set for achieving them. Immunization and cancer screening needs are specifically addressed at this visit.

## 2018-02-01 ENCOUNTER — Telehealth: Payer: Self-pay | Admitting: Family Medicine

## 2018-02-01 NOTE — Telephone Encounter (Signed)
Did you want her to have physical therapy?

## 2018-02-01 NOTE — Telephone Encounter (Signed)
Pt is calling asking about Physical Therapy that you(Dr Moshe Cipro)  advised her that someone was trying to reach out to her.? I dont show anything regarding Therapy

## 2018-02-02 ENCOUNTER — Other Ambulatory Visit (HOSPITAL_COMMUNITY): Payer: Self-pay | Admitting: Psychiatry

## 2018-02-03 ENCOUNTER — Encounter: Payer: Self-pay | Admitting: Family Medicine

## 2018-02-03 NOTE — Assessment & Plan Note (Signed)
Controlled and treated by Psychiatry

## 2018-02-03 NOTE — Assessment & Plan Note (Signed)
Controlled, no change in medication [name] is reminded of the importance of commitment to daily physical activity for 30 minutes or more, as able and the need to limit carbohydrate intake to 30 to 60 grams per meal to help with blood sugar control.   The need to take medication as prescribed, test blood sugar as directed, and to call between visits if there is a concern that blood sugar is uncontrolled is also discussed.   [name] is reminded of the importance of daily foot exam, annual eye examination, and good blood sugar, blood pressure and cholesterol control. Treated by endo  Diabetic Labs Latest Ref Rng & Units 01/29/2018 01/23/2018 11/01/2017 10/31/2017 10/29/2017  HbA1c 4.0 - 5.6 % - - - - -  Microalbumin <2.0 mg/dL - - - - -  Micro/Creat Ratio 0.0 - 30.0 mg/g - - - - -  Chol 100 - 199 mg/dL - - - - -  HDL >39 mg/dL - - - - -  Calc LDL 0 - 99 mg/dL - - - - -  Triglycerides 0 - 149 mg/dL - - - - -  Creatinine 0.57 - 1.00 mg/dL 1.17(H) 0.80 1.14(H) 1.55(H) 1.01(H)  GFR >60.00 mL/min - - - - -   BP/Weight 01/31/2018 01/31/2018 01/29/2018 01/25/2018 12/31/2017 12/24/2017 4/91/7915  Systolic BP 056 979 480 165 537 97 482  Diastolic BP 66 80 60 52 54 48 58  Wt. (Lbs) 163 163 164 164 157 - 158  BMI 32.92 32.92 33.12 33.12 31.71 - 31.91  Some encounter information is confidential and restricted. Go to Review Flowsheets activity to see all data.   Foot/eye exam completion dates Latest Ref Rng & Units 11/20/2017 03/28/2017  Eye Exam No Retinopathy - Retinopathy(A)  Foot exam Order - - -  Foot Form Completion - Done -

## 2018-02-03 NOTE — Assessment & Plan Note (Signed)
Uncontrolled with marked fluctuation. Refer to cardiology for ambulatory BP monitoring Medication adjustment made with follow up in 6 to 8 weeks DASH diet and commitment to daily physical activity for a minimum of 30 minutes discussed and encouraged, as a part of hypertension management. The importance of attaining a healthy weight is also discussed.  BP/Weight 01/31/2018 01/31/2018 01/29/2018 01/25/2018 12/31/2017 12/24/2017 05/11/4067  Systolic BP 861 483 073 543 014 97 840  Diastolic BP 66 80 60 52 54 48 58  Wt. (Lbs) 163 163 164 164 157 - 158  BMI 32.92 32.92 33.12 33.12 31.71 - 31.91  Some encounter information is confidential and restricted. Go to Review Flowsheets activity to see all data.

## 2018-02-04 ENCOUNTER — Encounter (HOSPITAL_COMMUNITY): Payer: Self-pay | Admitting: Psychiatry

## 2018-02-04 ENCOUNTER — Ambulatory Visit (INDEPENDENT_AMBULATORY_CARE_PROVIDER_SITE_OTHER): Payer: Medicare HMO | Admitting: Psychiatry

## 2018-02-04 VITALS — BP 142/74 | HR 75 | Ht 59.0 in | Wt 164.0 lb

## 2018-02-04 DIAGNOSIS — F331 Major depressive disorder, recurrent, moderate: Secondary | ICD-10-CM | POA: Diagnosis not present

## 2018-02-04 DIAGNOSIS — G4733 Obstructive sleep apnea (adult) (pediatric): Secondary | ICD-10-CM | POA: Diagnosis not present

## 2018-02-04 MED ORDER — TRAZODONE HCL 150 MG PO TABS: 75.0000 mg | ORAL_TABLET | Freq: Every day | ORAL | 2 refills | Status: DC

## 2018-02-04 MED ORDER — RISPERIDONE 0.5 MG PO TABS: 0.5000 mg | ORAL_TABLET | Freq: Every day | ORAL | 2 refills | Status: DC

## 2018-02-04 MED ORDER — SERTRALINE HCL 100 MG PO TABS: 100.0000 mg | ORAL_TABLET | Freq: Every day | ORAL | 2 refills | Status: DC

## 2018-02-04 MED ORDER — LORAZEPAM 0.5 MG PO TABS: 0.5000 mg | ORAL_TABLET | Freq: Two times a day (BID) | ORAL | 2 refills | Status: DC
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
## 2018-02-04 NOTE — Telephone Encounter (Signed)
Sionce No recent falls and NO report of significant imbalance I do not recommend it at this time. The message I saw stating "missed visit" may have been from home health nurse, that is ;liokely what I mentioned at the visit, sorry for the confusion, please let her know

## 2018-02-04 NOTE — Telephone Encounter (Signed)
Spoke with patient and advised her that Dr.[name] does not recommend physical therapy at this time. She verbalized understanding.

## 2018-02-05 ENCOUNTER — Ambulatory Visit: Payer: Self-pay

## 2018-02-05 ENCOUNTER — Telehealth: Payer: Self-pay | Admitting: Internal Medicine

## 2018-02-05 DIAGNOSIS — Z7982 Long term (current) use of aspirin: Secondary | ICD-10-CM | POA: Diagnosis not present

## 2018-02-05 DIAGNOSIS — F17200 Nicotine dependence, unspecified, uncomplicated: Secondary | ICD-10-CM | POA: Diagnosis not present

## 2018-02-05 DIAGNOSIS — R040 Epistaxis: Secondary | ICD-10-CM | POA: Diagnosis not present

## 2018-02-05 DIAGNOSIS — J3489 Other specified disorders of nose and nasal sinuses: Secondary | ICD-10-CM | POA: Diagnosis not present

## 2018-02-05 MED ORDER — GLUCOSE BLOOD VI STRP: ORAL_STRIP | 2 refills | Status: DC

## 2018-02-05 MED ORDER — ACCU-CHEK FASTCLIX LANCETS MISC: 3 refills | Status: DC

## 2018-02-05 NOTE — Telephone Encounter (Signed)
Forwarding

## 2018-02-05 NOTE — Telephone Encounter (Signed)
Can we put her in an initial visit slot in December?

## 2018-02-05 NOTE — Telephone Encounter (Signed)
Yes, please.

## 2018-02-05 NOTE — Telephone Encounter (Signed)
Ok to do this please advise

## 2018-02-05 NOTE — Telephone Encounter (Signed)
Patient called and advised that she was told by her mail in pharmacy that they can not get her test strips and lancets until 02/15/13 and she needs an RX that can bridge the gap   Home: [phone] (call first)

## 2018-02-05 NOTE — Telephone Encounter (Signed)
RX sent to local pharmacy.  Patient also asking about her Dec 26th appointment, she said she was not able to get transportation for that day and we were going to find a new appointment sooner than March.  Please advise.

## 2018-02-06 ENCOUNTER — Ambulatory Visit: Payer: Self-pay

## 2018-02-06 ENCOUNTER — Encounter: Payer: Self-pay | Admitting: Family Medicine

## 2018-02-07 ENCOUNTER — Encounter: Payer: Self-pay | Admitting: Family Medicine

## 2018-02-07 NOTE — Assessment & Plan Note (Signed)
Annual exam as documented. Counseling done  re healthy lifestyle involving commitment to 150 minutes exercise per week, heart healthy diet, and attaining healthy weight.The importance of adequate sleep also discussed. Regular seat belt use and home safety, is also discussed. Changes in health habits are decided on by the patient with goals and time frames  set for achieving them. Immunization and cancer screening needs are specifically addressed at this visit.  

## 2018-02-07 NOTE — Assessment & Plan Note (Signed)
Asked:confirms currently smokes approximately 10 cigarettes daily Assess: Wants to quit and is  cutting back, will start using nicotrol inhaler and understands she cannot smoke while using this Advise: needs to QUIT to reduce risk of cancer, cardio and cerebrovascular disease Assist: counseled for 5 minutes and literature provided Arrange: follow up in 3 months

## 2018-02-07 NOTE — Assessment & Plan Note (Signed)
Labile, often hypotensive on current regime, Reduce cloniidine dose to 0.2 mg tablet, will benefit from, and needs ambulatory BP monitoring DASH diet and commitment to daily physical activity for a minimum of 30 minutes discussed and encouraged, as a part of hypertension management. The importance of attaining a healthy weight is also discussed.  BP/Weight 01/31/2018 01/31/2018 01/29/2018 01/25/2018 12/31/2017 12/24/2017 4/31/5400  Systolic BP 867 [phone] 97 458  Diastolic BP 66 80 60 52 54 48 58  Wt. (Lbs) 163 163 164 164 157 - 158  BMI 32.92 32.92 33.12 33.12 31.71 - 31.91  Some encounter information is confidential and restricted. Go to Review Flowsheets activity to see all data.

## 2018-02-08 ENCOUNTER — Ambulatory Visit: Payer: Self-pay | Admitting: Internal Medicine

## 2018-02-10 DIAGNOSIS — I10 Essential (primary) hypertension: Secondary | ICD-10-CM | POA: Diagnosis not present

## 2018-02-10 DIAGNOSIS — J449 Chronic obstructive pulmonary disease, unspecified: Secondary | ICD-10-CM | POA: Diagnosis not present

## 2018-02-11 ENCOUNTER — Other Ambulatory Visit (HOSPITAL_COMMUNITY): Payer: Self-pay | Admitting: Psychiatry

## 2018-02-11 ENCOUNTER — Other Ambulatory Visit: Payer: Self-pay | Admitting: Family Medicine

## 2018-02-11 ENCOUNTER — Telehealth (HOSPITAL_COMMUNITY): Payer: Self-pay | Admitting: *Deleted

## 2018-02-11 ENCOUNTER — Telehealth: Payer: Self-pay | Admitting: *Deleted

## 2018-02-11 ENCOUNTER — Other Ambulatory Visit: Payer: Self-pay

## 2018-02-11 DIAGNOSIS — E559 Vitamin D deficiency, unspecified: Secondary | ICD-10-CM | POA: Diagnosis not present

## 2018-02-11 DIAGNOSIS — E785 Hyperlipidemia, unspecified: Secondary | ICD-10-CM | POA: Diagnosis not present

## 2018-02-11 DIAGNOSIS — M542 Cervicalgia: Secondary | ICD-10-CM | POA: Diagnosis not present

## 2018-02-11 DIAGNOSIS — E1165 Type 2 diabetes mellitus with hyperglycemia: Secondary | ICD-10-CM | POA: Diagnosis not present

## 2018-02-11 DIAGNOSIS — Z794 Long term (current) use of insulin: Secondary | ICD-10-CM | POA: Diagnosis not present

## 2018-02-11 DIAGNOSIS — Z79899 Other long term (current) drug therapy: Secondary | ICD-10-CM | POA: Diagnosis not present

## 2018-02-11 DIAGNOSIS — Q7649 Other congenital malformations of spine, not associated with scoliosis: Secondary | ICD-10-CM | POA: Diagnosis not present

## 2018-02-11 DIAGNOSIS — M4716 Other spondylosis with myelopathy, lumbar region: Secondary | ICD-10-CM | POA: Diagnosis not present

## 2018-02-11 DIAGNOSIS — R260 Ataxic gait: Secondary | ICD-10-CM | POA: Diagnosis not present

## 2018-02-11 DIAGNOSIS — M5442 Lumbago with sciatica, left side: Secondary | ICD-10-CM | POA: Diagnosis not present

## 2018-02-11 DIAGNOSIS — M5136 Other intervertebral disc degeneration, lumbar region: Secondary | ICD-10-CM | POA: Diagnosis not present

## 2018-02-11 DIAGNOSIS — E119 Type 2 diabetes mellitus without complications: Secondary | ICD-10-CM | POA: Diagnosis not present

## 2018-02-11 DIAGNOSIS — F060 Psychotic disorder with hallucinations due to known physiological condition: Secondary | ICD-10-CM | POA: Diagnosis not present

## 2018-02-11 MED ORDER — RISPERIDONE 0.5 MG PO TABS: 0.5000 mg | ORAL_TABLET | Freq: Every day | ORAL | 2 refills | Status: DC

## 2018-02-11 MED ORDER — TRAZODONE HCL 150 MG PO TABS: 75.0000 mg | ORAL_TABLET | Freq: Every day | ORAL | 2 refills | Status: DC

## 2018-02-11 MED ORDER — GLUCOSE BLOOD VI STRP: ORAL_STRIP | 2 refills | Status: DC

## 2018-02-11 MED ORDER — SERTRALINE HCL 100 MG PO TABS: 100.0000 mg | ORAL_TABLET | Freq: Every day | ORAL | 2 refills | Status: DC

## 2018-02-11 MED ORDER — LAMOTRIGINE 100 MG PO TABS: 100.0000 mg | ORAL_TABLET | Freq: Two times a day (BID) | ORAL | 2 refills | Status: DC

## 2018-02-11 MED ORDER — LORAZEPAM 0.5 MG PO TABS: 0.5000 mg | ORAL_TABLET | Freq: Two times a day (BID) | ORAL | 2 refills | Status: DC

## 2018-02-11 NOTE — Telephone Encounter (Signed)
[name] nurse with [facility] stated pt needs diabetic test strips. She gets them through [facility] but they sent her the wrong strips. Nurse can be reached at [phone]

## 2018-02-11 NOTE — Progress Notes (Signed)
Test strips reordered 

## 2018-02-11 NOTE — Telephone Encounter (Signed)
sent 

## 2018-02-11 NOTE — Telephone Encounter (Signed)
Dr Harrington Challenger Patient call &  is now having all her med's filled thru [facility]

## 2018-02-13 LAB — MICROALBUMIN / CREATININE URINE RATIO
Creatinine, Urine: 135 mg/dL
Microalb/Creat Ratio: 13.3 mg/g creat (ref 0.0–30.0)
Microalbumin, Urine: 18 ug/mL

## 2018-02-13 LAB — VITAMIN D 25 HYDROXY (VIT D DEFICIENCY, FRACTURES): VIT D 25 HYDROXY: 50.1 ng/mL (ref 30.0–100.0)

## 2018-02-13 LAB — SPECIMEN STATUS REPORT

## 2018-02-13 LAB — LIPID PANEL W/O CHOL/HDL RATIO
CHOLESTEROL TOTAL: 143 mg/dL (ref 100–199)
HDL: 53 mg/dL (ref >39)
LDL CALC: 72 mg/dL (ref 0–99)
TRIGLYCERIDES: 89 mg/dL (ref 0–149)
VLDL Cholesterol Cal: 18 mg/dL (ref 5–40)

## 2018-02-15 ENCOUNTER — Inpatient Hospital Stay (HOSPITAL_COMMUNITY): Payer: Medicare HMO | Attending: Hematology

## 2018-02-15 ENCOUNTER — Other Ambulatory Visit: Payer: Self-pay | Admitting: Internal Medicine

## 2018-02-15 DIAGNOSIS — F17210 Nicotine dependence, cigarettes, uncomplicated: Secondary | ICD-10-CM | POA: Insufficient documentation

## 2018-02-15 DIAGNOSIS — Z794 Long term (current) use of insulin: Secondary | ICD-10-CM | POA: Insufficient documentation

## 2018-02-15 DIAGNOSIS — E119 Type 2 diabetes mellitus without complications: Secondary | ICD-10-CM | POA: Diagnosis not present

## 2018-02-15 DIAGNOSIS — N189 Chronic kidney disease, unspecified: Secondary | ICD-10-CM | POA: Diagnosis not present

## 2018-02-15 DIAGNOSIS — Z720 Tobacco use: Secondary | ICD-10-CM

## 2018-02-15 DIAGNOSIS — Z79899 Other long term (current) drug therapy: Secondary | ICD-10-CM | POA: Diagnosis not present

## 2018-02-15 DIAGNOSIS — I10 Essential (primary) hypertension: Secondary | ICD-10-CM | POA: Insufficient documentation

## 2018-02-15 DIAGNOSIS — R0602 Shortness of breath: Secondary | ICD-10-CM

## 2018-02-15 DIAGNOSIS — D509 Iron deficiency anemia, unspecified: Secondary | ICD-10-CM | POA: Diagnosis not present

## 2018-02-15 DIAGNOSIS — I700 Atherosclerosis of aorta: Secondary | ICD-10-CM | POA: Diagnosis not present

## 2018-02-15 DIAGNOSIS — Z7982 Long term (current) use of aspirin: Secondary | ICD-10-CM | POA: Insufficient documentation

## 2018-02-15 DIAGNOSIS — D631 Anemia in chronic kidney disease: Secondary | ICD-10-CM | POA: Insufficient documentation

## 2018-02-15 DIAGNOSIS — Z90710 Acquired absence of both cervix and uterus: Secondary | ICD-10-CM | POA: Insufficient documentation

## 2018-02-15 LAB — CBC WITH DIFFERENTIAL/PLATELET
ABS IMMATURE GRANULOCYTES: 0.02 10*3/uL (ref 0.00–0.07)
Basophils Absolute: 0 10*3/uL (ref 0.0–0.1)
Basophils Relative: 0 %
EOS PCT: 2 %
Eosinophils Absolute: 0.1 10*3/uL (ref 0.0–0.5)
HEMATOCRIT: 36.4 % (ref 36.0–46.0)
HEMOGLOBIN: 10.2 g/dL — AB (ref 12.0–15.0)
Immature Granulocytes: 0 %
LYMPHS PCT: 29 %
Lymphs Abs: 1.8 10*3/uL (ref 0.7–4.0)
MCH: 22.1 pg — ABNORMAL LOW (ref 26.0–34.0)
MCHC: 28 g/dL — AB (ref 30.0–36.0)
MCV: 79 fL — AB (ref 80.0–100.0)
MONOS PCT: 9 %
Monocytes Absolute: 0.6 10*3/uL (ref 0.1–1.0)
NEUTROS ABS: 3.7 10*3/uL (ref 1.7–7.7)
Neutrophils Relative %: 60 %
Platelets: 205 10*3/uL (ref 150–400)
RBC: 4.61 MIL/uL (ref 3.87–5.11)
RDW: 13.8 % (ref 11.5–15.5)
WBC: 6.1 10*3/uL (ref 4.0–10.5)
nRBC: 0 % (ref 0.0–0.2)

## 2018-02-15 LAB — COMPREHENSIVE METABOLIC PANEL
ALK PHOS: 93 U/L (ref 38–126)
ALT: 22 U/L (ref 0–44)
AST: 23 U/L (ref 15–41)
Albumin: 3.9 g/dL (ref 3.5–5.0)
Anion gap: 6 (ref 5–15)
BILIRUBIN TOTAL: 0.2 mg/dL — AB (ref 0.3–1.2)
BUN: 21 mg/dL (ref 8–23)
CALCIUM: 9 mg/dL (ref 8.9–10.3)
CO2: 24 mmol/L (ref 22–32)
CREATININE: 0.89 mg/dL (ref 0.44–1.00)
Chloride: 114 mmol/L — ABNORMAL HIGH (ref 98–111)
Glucose, Bld: 61 mg/dL — ABNORMAL LOW (ref 70–99)
Potassium: 3.5 mmol/L (ref 3.5–5.1)
Sodium: 144 mmol/L (ref 135–145)
TOTAL PROTEIN: 7.1 g/dL (ref 6.5–8.1)

## 2018-02-15 LAB — FERRITIN: Ferritin: 41 ng/mL (ref 11–307)

## 2018-02-15 LAB — LACTATE DEHYDROGENASE: LDH: 192 U/L (ref 98–192)

## 2018-02-18 ENCOUNTER — Ambulatory Visit (INDEPENDENT_AMBULATORY_CARE_PROVIDER_SITE_OTHER): Payer: Medicare HMO | Admitting: Internal Medicine

## 2018-02-18 ENCOUNTER — Encounter: Payer: Self-pay | Admitting: Internal Medicine

## 2018-02-18 VITALS — BP 160/60 | HR 76 | Ht 59.0 in | Wt 164.0 lb

## 2018-02-18 DIAGNOSIS — M4716 Other spondylosis with myelopathy, lumbar region: Secondary | ICD-10-CM | POA: Diagnosis not present

## 2018-02-18 DIAGNOSIS — R260 Ataxic gait: Secondary | ICD-10-CM | POA: Diagnosis not present

## 2018-02-18 DIAGNOSIS — F060 Psychotic disorder with hallucinations due to known physiological condition: Secondary | ICD-10-CM | POA: Diagnosis not present

## 2018-02-18 DIAGNOSIS — E039 Hypothyroidism, unspecified: Secondary | ICD-10-CM

## 2018-02-18 DIAGNOSIS — E1165 Type 2 diabetes mellitus with hyperglycemia: Secondary | ICD-10-CM | POA: Diagnosis not present

## 2018-02-18 DIAGNOSIS — Z794 Long term (current) use of insulin: Secondary | ICD-10-CM

## 2018-02-18 DIAGNOSIS — M5136 Other intervertebral disc degeneration, lumbar region: Secondary | ICD-10-CM | POA: Diagnosis not present

## 2018-02-18 DIAGNOSIS — E119 Type 2 diabetes mellitus without complications: Secondary | ICD-10-CM | POA: Diagnosis not present

## 2018-02-18 DIAGNOSIS — M542 Cervicalgia: Secondary | ICD-10-CM | POA: Diagnosis not present

## 2018-02-18 DIAGNOSIS — E042 Nontoxic multinodular goiter: Secondary | ICD-10-CM | POA: Diagnosis not present

## 2018-02-18 DIAGNOSIS — M5442 Lumbago with sciatica, left side: Secondary | ICD-10-CM | POA: Diagnosis not present

## 2018-02-18 DIAGNOSIS — Q7649 Other congenital malformations of spine, not associated with scoliosis: Secondary | ICD-10-CM | POA: Diagnosis not present

## 2018-02-18 LAB — POCT GLYCOSYLATED HEMOGLOBIN (HGB A1C): Hemoglobin A1C: 5.1 % (ref 4.0–5.6)

## 2018-02-18 MED ORDER — GLUCOSE BLOOD VI STRP: ORAL_STRIP | 2 refills | Status: DC

## 2018-02-18 MED ORDER — ACCU-CHEK FASTCLIX LANCETS MISC: 0 refills | Status: DC

## 2018-02-18 NOTE — Progress Notes (Signed)
Patient ID: [name], female   DOB: 1950-05-10, 67 y.o.   MRN: [id number]  HPI: [name] is a 89 y.o.-year-old female, initially referred by her PCP, [name], for management of DM2, dx 1995, insulin-dependent since 1997, uncontrolled, with complications (gastroparesis, cerebrovasc. Ds-  H/o stroke, PN),  Hypothyroidism, thyroid nodules.  Last visit 3.5 months ago.  DM2: Last hemoglobin A1c was: Lab Results  Component Value Date   HGBA1C 5.8 (A) 10/09/2017   HGBA1C 5.8 06/07/2017   HGBA1C 6.0 (H) 02/05/2017  10/28/2013: HbA1c 8.5%  She is on: - Toujeo 22 >> 20 units at bedtime - Novolog:  10 units before a smaller meal  12 units before a regular meal  14 units before a larger meal or with lunch  Could not tolerate Metformin >> nausea.  Insulin doses were decreased in 04/2017 due to low blood sugars in the 40s.  Pt checks her sugars 4x a day - brings a great log: - am: 61-134, 188 >> 81-157, 200, 270 (UTI) >> 80-141, 425 (steroid inj) - 2h after b'fast: n/c >> 110 >> n/c - before lunch: 60-137, 210 >> 66, 80-155, 170, 366 >> 65, 74-144, 225, 247 (URI - ABx) - 2h after lunch: n/c >> 127-154, 229 >> n/c >> 115 - before dinner: 46 x1, 60-159, 191, 200 >> 95-151, 219 >> 92-145, 171, 218 - 2h after dinner: n/c - bedtime: 65, 71, 101-190, 244 >> 79, 100-179, 211 >> 102-151, 166 - nighttime: n/c Lowest sugar was 77 >> 46 x1 >> 66 >> 65; she has hypoglycemia awareness in the 70s. Highest sugar was 482 ([employer]giving) >> 244 >> 366 x1 >> 425.  Pt's meals are: - Breakfast: eggs, cereals, fruit, oatmeal, bacon - Lunch: sandwich, beef, mashed potatoes,diet soda - Dinner: chicken, beef, fish, potatoes, vegetables - Snacks: 1-2: fruit  - + mild CKD, last BUN/creatinine:  Lab Results  Component Value Date   BUN 21 02/15/2018   CREATININE 0.89 02/15/2018  On Losartan. -+ HL; last set of lipids: Lab Results  Component Value Date   CHOL 143 02/11/2018   HDL 53 02/11/2018    LDLCALC 72 02/11/2018   TRIG 89 02/11/2018   CHOLHDL 3.1 07/19/2015  On Crestor and Zetia. - last eye exam was in 03/2017: + DR; Had cataract sx B. Dr. Hassell Done [facility]). - she has Numbness and tingling mainly in L leg - affected by stroke. Sees podiatry.  Hypothyroidism: - dx many years ago On levothyroxine 50 mcg 6 out of 7 days and 25 mcg 1 out of 7 days: - in am - fasting - at least 30 min before b'fast - no Ca, Fe - + MVI at 12 pm - + Aciphex with L and D - + Biotin in B complex  Latest TSH levels normal: Lab Results  Component Value Date   TSH 0.52 10/08/2017   TSH 0.827 10/15/2016   TSH 0.906 01/22/2016   TSH 1.030 11/03/2015   TSH 1.207 02/21/2015   Thyroid nodules.    Pt denies: - feeling nodules in neck - hoarseness - choking - SOB with lying down  She has chronic dysphagia with certain foods. She had previous esophageal dilations.  Reviewed her imaging and Bx tests: thyroid U/S (10/2013):  Right thyroid lobe: 3.7 x 1.0 x 1.8 cm   Left thyroid lobe: 3.5 x 1.5 x 1.6 cm   Isthmus: 0.5 cm   Focal nodules: There is a 0.3 mm calcified nodule in the right midzone.  There is a 0.6 x 0.7 x 0.5 cm hypoechoic nodule in the  lateral aspect of the right midzone. There is a 0.7 x 0.6 x 0.5 cm hyperechoic nodule in the lateral aspect of the right lower pole.  There is a 2.2 x 1.1 x 1.7 cm complex solid nodule in the inferior aspect of the isthmus extending adjacent to the left lower pole.  There is a 1.4 x 2.1 x 1.4 cm complex solid nodule in the left lower pole.   Lymphadenopathy: None visualized.  Repeat U/S (01/2014): Stable appearance of the nodules  FNA (2014) x2: Benign  Repeat U/S (10/2015): stable appearance of the nodules  Repeat U/S (10/2016): stable appearance of the nodules  She had a L rib fracture in 12/2014.  Patient was admitted with AMS + sepsis on 10/15/2016. She had aspiration pneumonia and acute respiratory failure.   ROS: Constitutional: +  weight gain/no weight loss, no fatigue, no subjective hyperthermia, no subjective hypothermia, + nocturia Eyes: no blurry vision, no xerophthalmia ENT: no sore throat, + see HPI Cardiovascular: no CP/+ SOB/no palpitations/no leg swelling Respiratory: no cough/+ SOB/no wheezing Gastrointestinal: no N/no V/no D/no C/no acid reflux Musculoskeletal: no muscle aches/no joint aches Skin: no rashes, no hair loss Neurological: no tremors/no numbness/no tingling/no dizziness  I reviewed pt's medications, allergies, PMH, social hx, family hx, and changes were documented in the history of present illness. Otherwise, unchanged from my initial visit note.  Past Medical History:  Diagnosis Date  . Allergy   . Anemia   . Anxiety    takes Ativan daily  . Arthritis   . Assistance needed for mobility   . Bipolar disorder ([place])    takes Risperdal nightly  . Blood transfusion   . Cancer ([place])    In her gum  . Carpal tunnel syndrome of right wrist 05/23/2011  . Cervical disc disorder with radiculopathy of cervical region 10/31/2012  . Chronic back pain   . Chronic idiopathic constipation   . Chronic neck and back pain   . Colon polyps   . COPD (chronic obstructive pulmonary disease) with chronic bronchitis ([place]) 09/16/2013   Office Spirometry 10/30/2013-submaximal effort based on appearance of loop and curve. Numbers would fit with severe restriction but her physiologic capability may be better than this. FVC 0.91/44%, and 10.74/45%, FEV1/FVC 0.81, FEF 25-75% 1.43/69%    . Depression    takes Zoloft daily  . Diabetes mellitus    Type II  . Diverticulosis    TCS 9/08 by Dr. Delfin Edis for diarrhea . Bx for micro scopic colitis negative.   . Fibromyalgia   . Frequent falls   . GERD (gastroesophageal reflux disease)    takes Aciphex daily  . Glaucoma    eye drops daily  . Gum symptoms    infection on antibiotic  . Hemiplegia affecting non-dominant side, post-stroke 08/02/2011  . Hyperlipidemia     takes Crestor daily  . Hypertension    takes Amlodipine,Metoprolol,and Clonidine daily  . Hypothyroidism    takes Synthroid daily  . IBS (irritable bowel syndrome)   . Insomnia    takes Trazodone nightly  . 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 Metabolic encephalopathy 1/54/0086  . Migraines    chronic headaches  . Mononeuritis lower limb   . Narcolepsy   . Osteoporosis   . Pancreatitis 2006   due to Depakote with normal EUS   . Schatzki's ring    non critical / EGD with ED 8/2011with RMR  .  Seizures ([place])    takes Lamictal daily.Last seizure 3 yrs ago  . Sleep apnea    on CPAP  . Stroke [facility])    left sided weakness, speech changes  . Tubular adenoma of colon    Past Surgical History:  Procedure Laterality Date  . ABDOMINAL HYSTERECTOMY  1978  . BACK SURGERY  July 2012  . BACTERIAL OVERGROWTH TEST N/A 05/05/2013   Procedure: BACTERIAL OVERGROWTH TEST;  Surgeon: Daneil Dolin, MD;  Location: [facility];  Service: Endoscopy;  Laterality: N/A;  7:30  . BIOPSY THYROID  2009  . BRAIN SURGERY  11/2011   resection of meningioma  . BREAST REDUCTION SURGERY  1994  . CARDIAC CATHETERIZATION  05/10/2005   normal coronaries, normal LV systolic function and EF (Dr. Jackie Plum)  . CARPAL TUNNEL RELEASE Left 07/22/04   Dr. Aline Brochure  . CATARACT EXTRACTION Bilateral   . CHOLECYSTECTOMY  1984  . COLONOSCOPY N/A 09/25/2012   XNA:TFTDDUK diverticulosis.  colonic polyp-removed : tubular adenoma  . CRANIOTOMY  11/23/2011   Procedure: CRANIOTOMY TUMOR EXCISION;  Surgeon: Hosie Spangle, MD;  Location: [place] NEURO ORS;  Service: Neurosurgery;  Laterality: N/A;  Craniotomy for tumor resection  . ESOPHAGOGASTRODUODENOSCOPY  12/29/2010   Rourk-Retained food in the esophagus and stomach, small hiatal hernia, status post Maloney dilation of the esophagus  . ESOPHAGOGASTRODUODENOSCOPY N/A 09/25/2012   GUR:KYHCWCBJ atonic baggy esophagus status post Maloney dilation 44 F. Hiatal hernia  . GIVENS  CAPSULE STUDY N/A 01/15/2013   NORMAL.   . IR GENERIC HISTORICAL  03/17/2016   IR RADIOLOGIST EVAL & MGMT 03/17/2016 [facility]-INTER[facility] RAD  . LESION REMOVAL N/A 05/31/2015   Procedure: REMOVAL RIGHT AND LEFT LESIONS OF MANDIBLE;  Surgeon: Diona Browner, DDS;  Location: [place];  Service: Oral Surgery;  Laterality: N/A;  . MALONEY DILATION  12/29/2010   RMR;  . NM MYOCAR PERF WALL MOTION  2006   "relavtiely normal" persantine, mild anterior thinning (breast attenuation artifact), no region of scar/ischemia  . OVARIAN CYST REMOVAL    . RECTOCELE REPAIR N/A 06/29/2015   Procedure: POSTERIOR REPAIR (RECTOCELE);  Surgeon: Jonnie Kind, MD;  Location: [facility];  Service: Gynecology;  Laterality: N/A;  . REDUCTION MAMMAPLASTY Bilateral   . SPINE SURGERY  09/29/2010   Dr. Rolena Infante  . surgical excision of 3 tumors from right thigh and right buttock  and left upper thigh  2010  . TOOTH EXTRACTION Bilateral 12/14/2014   Procedure: REMOVAL OF BILATERAL MANDIBULAR EXOSTOSES;  Surgeon: Diona Browner, DDS;  Location: [place];  Service: Oral Surgery;  Laterality: Bilateral;  . TRANSTHORACIC ECHOCARDIOGRAM  2010   EF 60-65%, mild conc LVH, grade 1 diastolic dysfunction; mildly calcified MV annulus with mildly thickened leaflets, mildly calcified MR annulus   History   Social History  . Marital Status: Divorced    Spouse Name: N/A    Number of Children: 1  . Years of Education: 12   Occupational History  . disabled     Social History Main Topics  . Smoking status: Current Every Day Smoker -- 0.25 packs/day for 7 years    Types: Cigarettes  . Smokeless tobacco: Never Used     Comment: "started back but off now for 3 months" (08/18/13)  . Alcohol Use: No     Comment:    . Drug Use: No   Current Outpatient Medications on File Prior to Visit  Medication Sig Dispense Refill  . ACCU-CHEK FASTCLIX LANCETS MISC USE TO CHECK  BLOOD SUGAR 3 TIMES A DAY 306 each 0  . albuterol (PROVENTIL HFA;VENTOLIN HFA) 108 (90 Base)  MCG/ACT inhaler Inhale 1-2 puffs every 6 hours as needed for wheezing, shortness of breath 3 Inhaler 3  . amLODipine (NORVASC) 5 MG tablet Take 1 tablet (5 mg total) by mouth daily. 90 tablet 1  . ascorbic acid (VITAMIN C) 500 MG tablet Take 500 mg by mouth daily.    [name] aspirin EC 81 MG tablet Take 81 mg by mouth daily.    . Blood Glucose Monitoring Suppl (ACCU-CHEK AVIVA) device Use as instructed 1 each 0  . cetirizine (ZYRTEC) 10 MG tablet Take 1 tablet (10 mg total) by mouth daily. 7 tablet 0  . Cholecalciferol (VITAMIN D3) 5000 units CAPS Take 1 capsule by mouth daily.    . cloNIDine (CATAPRES) 0.2 MG tablet One tablet at bedtime for blood pressure 90 tablet 3  . diclofenac sodium (VOLTAREN) 1 % GEL Apply 2 g topically daily as needed (pain). Rubs "wherever there is pain" when she doesn't take her pain pill. 100 g 0  . docusate sodium (COLACE) 100 MG capsule Take 100 mg by mouth 2 (two) times daily.    . DROPLET PEN NEEDLES 32G X 4 MM MISC USE  TO INJECT FOUR TIMES DAILY 400 each 0  . glucose blood (ACCU-CHEK SMARTVIEW) test strip TEST BLOOD SUGAR FOUR TIMES DAILY AS DIRECTED 350 each 2  . hydroxychloroquine (PLAQUENIL) 200 MG tablet Take 200 mg by mouth daily.     . insulin aspart (NOVOLOG FLEXPEN) 100 UNIT/ML FlexPen INJECT 10 TO 14 UNITS INTO THE SKIN 3 (THREE) TIMES DAILY WITH MEALS. 45 mL 3  . Insulin Glargine (TOUJEO SOLOSTAR) 300 UNIT/ML SOPN Inject 20 Units into the skin at bedtime. 9 pen 3  . lamoTRIgine (LAMICTAL) 100 MG tablet Take 1 tablet (100 mg total) by mouth 2 (two) times daily. 180 tablet 2  . levothyroxine (SYNTHROID, LEVOTHROID) 50 MCG tablet TAKE 1 TABLET  DAILY  EXCEPT TAKE 1/2 TABLET  ON  SUNDAY 85 tablet 1  . LORazepam (ATIVAN) 0.5 MG tablet Take 1 tablet (0.5 mg total) by mouth 2 (two) times daily. 180 tablet 2  . losartan (COZAAR) 50 MG tablet Take 1 tablet (50 mg total) by mouth daily. 90 tablet 1  . metoprolol tartrate (LOPRESSOR) 50 MG tablet TAKE 1 TABLET TWICE  DAILY (NEED MD APPOINTMENT) 60 tablet 3  . montelukast (SINGULAIR) 10 MG tablet TAKE 1 TABLET EVERY DAY 90 tablet 1  . Multiple Vitamins-Minerals (HM MULTIVITAMIN ADULT GUMMY PO) Take 1 tablet by mouth daily.    . Nicotine (NICOTROL NS) 10 MG/ML SOLN Use 1 spray per nostril up to eight times per day as needed 10 mL 0  . nystatin (MYCOSTATIN/NYSTOP) powder APPLY TO AFFECTED AREA 4 TIMES DAILY. 30 g 2  . oxyCODONE-acetaminophen (PERCOCET/ROXICET) 5-325 MG tablet Take 1 tablet by mouth every 6 (six) hours as needed.     . pregabalin (LYRICA) 75 MG capsule Take 1 capsule (75 mg total) by mouth daily.    . RABEprazole (ACIPHEX) 20 MG tablet Take 1 tablet (20 mg total) by mouth 2 (two) times daily. 180 tablet 3  . RESTASIS 0.05 % ophthalmic emulsion Place 1 drop into both eyes 2 (two) times daily.     . risperiDONE (RISPERDAL) 0.5 MG tablet Take 1 tablet (0.5 mg total) by mouth at bedtime. 90 tablet 2  . rosuvastatin (CRESTOR) 5 MG tablet TAKE 1 TABLET AT BEDTIME 90 tablet  1  . sertraline (ZOLOFT) 100 MG tablet Take 1 tablet (100 mg total) by mouth daily. 90 tablet 2  . traZODone (DESYREL) 150 MG tablet Take 0.5 tablets (75 mg total) by mouth at bedtime. 135 tablet 2  . triamcinolone ointment (KENALOG) 0.1 % Apply 1 application topically at bedtime. Apply to elbows at bedtime.    [name] UNABLE TO FIND Transport Chair x 1  Dx-bilateral leg weakness, frequent falls 1 each 0  . UNABLE TO FIND Rolling walker with seat x 1  DX. Bilateral lower extremity weakness, frequent falls, instability 1 each 0   No current facility-administered medications on file prior to visit.       Allergies  Allergen Reactions  . Cephalexin Hives  . Iron Nausea And Vomiting  . Milk-Related Compounds Other (See Comments)    Doesn't agree with stomach.   . Penicillins Hives       . Phenazopyridine Hcl Hives         Family History  Problem Relation Age of Onset  . Heart attack Mother        HTN  . Pneumonia Father    . Kidney failure Father   . Diabetes Father   . Pancreatic cancer Sister   . Diabetes Brother   . Hypertension Brother   . Diabetes Brother   . Cancer Sister        breast   . Hypertension Son   . Sleep apnea Son   . Cancer Sister        pancreatic  . Stroke Maternal Grandmother   . Heart attack Maternal Grandfather   . Alcohol abuse Maternal Uncle   . Colon cancer Neg Hx   . Anesthesia problems Neg Hx   . Hypotension Neg Hx   . Malignant hyperthermia Neg Hx   . Pseudochol deficiency Neg Hx    PE: BP (!) 160/60   Pulse 76   Ht 4\' 11"  (1.499 m) Comment: measured  Wt 164 lb (74.4 kg)   SpO2 96%   BMI 33.12 kg/m  Body mass index is 33.12 kg/m.  Wt Readings from Last 3 Encounters:  02/18/18 164 lb (74.4 kg)  01/31/18 163 lb (73.9 kg)  01/31/18 163 lb (73.9 kg)   Constitutional: normal weight, in NAD, she walks with a walker Eyes: PERRLA, EOMI, no exophthalmos ENT: moist mucous membranes, no thyromegaly but thyroid nodule palpated in isthmus, no cervical lymphadenopathy Cardiovascular: RRR, No RG, +1/6 SEM Respiratory: CTA B Gastrointestinal: abdomen soft, NT, ND, BS+ Musculoskeletal: no deformities, strength intact in all 4 Skin: moist, warm, no rashes Neurological: no tremor with outstretched hands, DTR normal in all 4  ASSESSMENT: 1. DM2, insulin-dependent, uncontrolled, without complications - gastroparesis - cerebrovasc. Ds -  H/o stroke - PN  She was interested in an insulin pump >> discussed about [employer] (given brochure) at a previous visit.  2. Hypothyroidism  3. MNG - Thyroid U/S (10/11/2012):  Right thyroid lobe: 3.7 x 1.0 x 1.8 cm  Left thyroid lobe: 3.5 x 1.5 x 1.6 cm  Isthmus: 0.5 cm  Focal nodules: There is a 0.3 mm calcified nodule in the right midzone. There is a 0.6 x 0.7 x 0.5 cm hypoechoic nodule in the lateral aspect of the right midzone. There is a 0.7 x 0.6 x 0.5 cm hyperechoic nodule in the lateral aspect of the right lower  pole. There is a 2.2 x 1.1 x 1.7 cm complex solid nodule in the inferior aspect of the isthmus extending  adjacent to the left lower pole. There is a 1.4 x 2.1 x 1.4 cm complex solid nodule in the left lower pole.  Lymphadenopathy: None visualized.  IMPRESSION: Multi nodular goiter which has markedly progressed since the prior exam. The dominant nodule at the inferior aspect of the isthmus fits criteria for fine needle aspiration biopsy if not previously Assessed.  - FNA (11/05/2012) x2: benign  - Thyroid U/S (01/20/2014) - felt one nodule enlarging >> new U/S: stable appearance of the nodules, except a small new nodule in isthmus: 1.2 cm   Right thyroid lobe Measurements: 4.4 cm x 1.2 cm x 1.7 cm. Multiple right-sided nodules identified. Each right-sided nodule demonstrates increased echogenicity, with the superior measuring 7 mm -8 mm, most inferior measuring 9 mm - 10 mm. A small, 3 mm focus of calcium with posterior shadowing is evident. There is also a mid nodule measuring 7 mm, which is echogenic.  Left thyroid lobe Measurements: 5.1 cm x 2.1 cm x 2.3 cm. Dominant lesion at the inferior pole of left thyroid is again evident, which has been previously biopsied (10/29/2012). This nodule measures 1.8 cm x 1.7cm x 2.5 cm. (Previous 1.4 cm x 2.1 cm x 1.4 cm)  Isthmus Thickness: 4 mm-5 mm. Isthmic nodule again noted, previously biopsied (10/29/2012). Currently this nodule measures 2.2 cm x 1.4 cm x 1.8 cm. (previous measurement 2.2 cm x 1.1 cm x 1.7 cm). There is a new nodule identified within the isthmus with heterogeneously hyperechoic characteristics. This nodule measures 12 mm x 5.4 mm x 7.2 mm.  Lymphadenopathy: None visualized.  - Thyroid U/S (11/09/2015): Details   Reading Physician Reading Date Result Priority  Sandi Mariscal, MD 11/09/2015   Narrative    COMPARISON: Thyroid ultrasound - 01/20/2014 ; 10/11/2012  FINDINGS: Parenchymal Echotexture: Moderately  heterogenous Estimated total number of nodules > 1 cm: <5 Number of spongiform nodules > 2 cm not described below (TR1): 0 Number of mixed cystic nodules > 1.5 cm not described below ([place]): 0 _____________________________________________________  Isthmus: 0.7 cm  Nodule # 1: Prior biopsy: No Location: Isthmus; left Size: 1.5 x 1.3 x 1.5 cm, previously 2.2 x 1.4 x 1.8 cm Composition: solid/almost completely solid (2) Echogenicity: hyperechoic (1) ACR TI-RADS total points: 3. Change in features: Yes. Increased internal cystic degeneration and smaller in size. Change in ACR TI-RADS risk category: No  *Given size (1.5 - 2.4 cm) and appearance, a follow-up ultrasound in 1 year is recommended based on TI-RADS criteria as clinically indicated.  _________________________________________________________  Right lobe: 4.6 x 1.1 x 1.9 cm, previously 4.4 x 1.2 x 1.7 cm Stable scattered subcentimeter echogenic nodules _________________________________________________________  Left lobe: 4.8 x 2.1 x 1.9 cm, previously 5.1 x 2.1 x 2.3 cm Nodule # 1: Prior biopsy: No Location: Left; Inferior Size: 2.4 x 1.7 x 1.9 cm, previously 2.5 x 1.7 x 1.7 Composition: solid/almost completely solid (2) Echogenicity: hyperechoic (1) ACR TI-RADS total points: 3. ACR TI-RADS risk category: TR3 (3 points). Change in features: No Change in ACR TI-RADS risk category: No *Given size (1.5 - 2.4 cm) and appearance, a follow-up ultrasound in 1 year is recommended based on TI-RADS criteria as clinically indicated.  IMPRESSION: TR 3 isthmic and left thyroid nodules unchanged. *Given size (1.5 - 2.4 cm) and appearance of both thyroid nodules, a follow-up ultrasound in 1 year is recommended based on TI-RADS criteria as clinically indicated.  The above is in keeping with the ACR TI-RADS recommendations - J Am Coll Radiol 2017;14:587-595.  10/12/2016: Thyroid U/S: Parenchymal Echotexture: Moderately  heterogenous Isthmus: 1.1 cm Right lobe: 3.8 x 1.0 x 1.7 cm Left lobe: 4.5 x 1.7 x 2.1 cm ______________________________________________________  Estimated total number of nodules >/= 1 cm: 3 _____________________________________________  Diffusely heterogeneous multinodular thyroid gland. As seen previously, there are multiple small echogenic nodules scattered throughout the right gland which do not meet criteria for biopsy or follow-up. Head and lobular pyramidal lobe extends superiorly from the thyroid isthmus. No interval change.  The previously biopsied nodule in the inferior aspect of the isthmus measures slightly smaller today at 1.5 x 1.0 x 1.4 cm compared to 1.7 x 1.3 x 1.5 cm previously.  The previously biopsied nodule in the left inferior gland is essentially unchanged at 2.6 x 1.3 x 1.5 cm compared to 2.4 x 1.8 x 1.9 cm. No new nodule or abnormality identified.  IMPRESSION: 1. Stable multinodular goiter with a prominent lobular parental lobe extending superiorly from the thyroid isthmus. 2. The previously biopsied nodule in the inferior isthmus measures slightly smaller on today's examination. 3. The previously biopsied nodule in the left inferior gland is stable. 4. Additional scattered small and benign-appearing nodules do not meet criteria for biopsy or continued follow-up. The above is in keeping with the ACR TI-RADS recommendations - J Am Coll Radiol 2017;14:587-595.   PLAN:  1. Patient with longstanding, previously uncontrolled, type 2 diabetes, on basal-bolus insulin regimen, with improving control in the last 2 years.  At last visit, HbA1c was excellent, at 5.8%.  At that time, she was having higher blood sugars due to UTI and a steroid injection.  I advised her to increase her insulin doses by 2 units for that period of time, but to decrease the dose back when sugar started to improve. We also discussed about the Freestyle libre CGM, which I  recommended and given a list of suppliers.  Unfortunately, she could not get the CGM. -At this visit, sugars are wonderful, with the exception of sugars in the 200s when she had a URI or a CBG in the 400s after steroid injections.  No lows and no significant highs outside the situations.  We can continue the same regimen but I advised her to go higher on the NovoLog dose if needed during the holidays. - I advised her to:  Patient Instructions  Please return in 6 months.  Please continue: - Toujeo 20 units at bedtime - Novolog:  10 units before a smaller meal  12 units before a regular meal  14 units before a larger meal  Please stop B complex 1 week before our next visit.  Please continue Levothyroxine 50 mcg 6/7 days, 25 mcg 1/7 days.  Take the thyroid hormone every day, with water, at least 30 minutes before breakfast, separated by at least 4 hours from: - acid reflux medications - calcium - iron - multivitamins  - today, HbA1c is 5.1% (improved) - continue checking sugars at different times of the day - check 3x a day, rotating checks - advised for yearly eye exams >> she is UTD - Return to clinic in 6 mo with sugar log    2. Hypothyroidism - latest thyroid labs reviewed with pt >> normal 09/2017 - she continues on LT4 50 mcg 6/7 days and 25 mcg 1/7 days  - pt feels good on this dose. - we discussed about taking the thyroid hormone every day, with water, >30 minutes before breakfast, separated by >4 hours from acid reflux medications, calcium, iron, multivitamins.  Pt. is taking it correctly. - she continues on LT4 50 mcg 6 out of 7 days and 25 mcg 1 out of 7 days -We will check the test at next visit.  I advised her to stop her B complex a week prior to the visit.  3. MNG  She previously had 2 normal biopsies in 2014  Reviewed the reports of her imaging tests: Ultrasound from 2014, 15, 17, 18: Also stable nodules  At this point, I would suggest not to repeat the  ultrasounds unless she develops neck compression symptoms.  For now, she only has mild dysphagia but no external esophageal compression per barium swallow study from 08/2014.  At that time, she did have some esophageal dysmotility and she had multiple esophageal dilations.  We will continue to watch for goiter expectantly for now.  Philemon Kingdom, MD PhD [facility] Endocrinology

## 2018-02-18 NOTE — Patient Instructions (Addendum)
Please return in 6 months.  Please continue: - Toujeo 20 units at bedtime - Novolog:  10 units before a smaller meal  12 units before a regular meal  14 units before a larger meal  Please stop B complex 1 week before our next visit.  Please continue Levothyroxine 50 mcg 6/7 days, 25 mcg 1/7 days.  Take the thyroid hormone every day, with water, at least 30 minutes before breakfast, separated by at least 4 hours from: - acid reflux medications - calcium - iron - multivitamins

## 2018-02-19 DIAGNOSIS — Q7649 Other congenital malformations of spine, not associated with scoliosis: Secondary | ICD-10-CM

## 2018-02-19 DIAGNOSIS — R260 Ataxic gait: Secondary | ICD-10-CM

## 2018-02-19 DIAGNOSIS — M5442 Lumbago with sciatica, left side: Secondary | ICD-10-CM | POA: Diagnosis not present

## 2018-02-19 DIAGNOSIS — M6281 Muscle weakness (generalized): Secondary | ICD-10-CM

## 2018-02-19 DIAGNOSIS — M4716 Other spondylosis with myelopathy, lumbar region: Secondary | ICD-10-CM

## 2018-02-19 DIAGNOSIS — M5136 Other intervertebral disc degeneration, lumbar region: Secondary | ICD-10-CM

## 2018-02-19 DIAGNOSIS — F060 Psychotic disorder with hallucinations due to known physiological condition: Secondary | ICD-10-CM

## 2018-02-19 DIAGNOSIS — I10 Essential (primary) hypertension: Secondary | ICD-10-CM

## 2018-02-19 DIAGNOSIS — Z794 Long term (current) use of insulin: Secondary | ICD-10-CM

## 2018-02-19 DIAGNOSIS — G8929 Other chronic pain: Secondary | ICD-10-CM

## 2018-02-19 DIAGNOSIS — M542 Cervicalgia: Secondary | ICD-10-CM | POA: Diagnosis not present

## 2018-02-19 DIAGNOSIS — E119 Type 2 diabetes mellitus without complications: Secondary | ICD-10-CM

## 2018-02-19 DIAGNOSIS — I959 Hypotension, unspecified: Secondary | ICD-10-CM

## 2018-02-19 IMAGING — DX DG CHEST 2V
2 series · 2 of 2 positions shown · non-contrast
Comparison: 05/01/2015

CLINICAL DATA: BROUWER/FP SELMO/ nodule. Hx of HTN, DM. Smoker.

EXAM:
CHEST  2 VIEW

[chest pa]
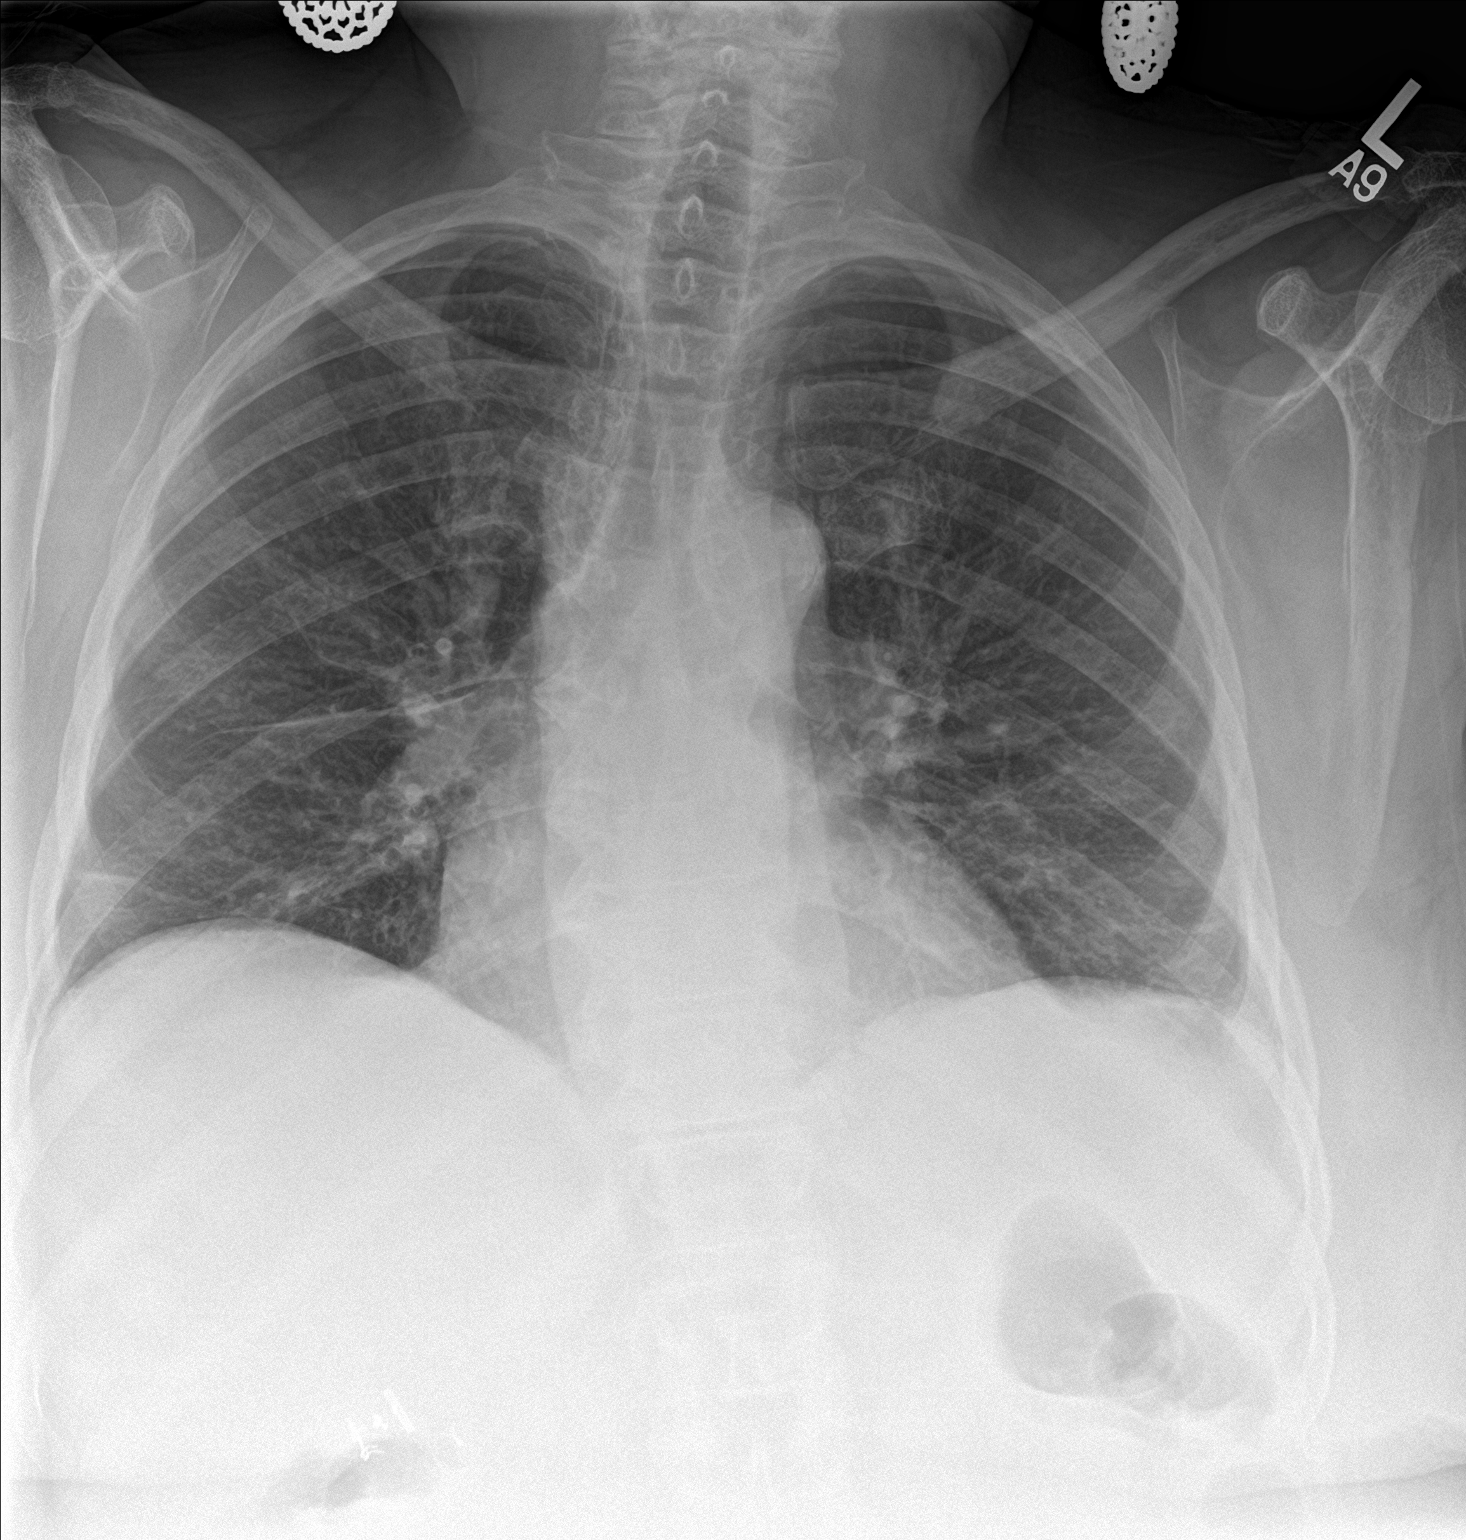

[chest lat]
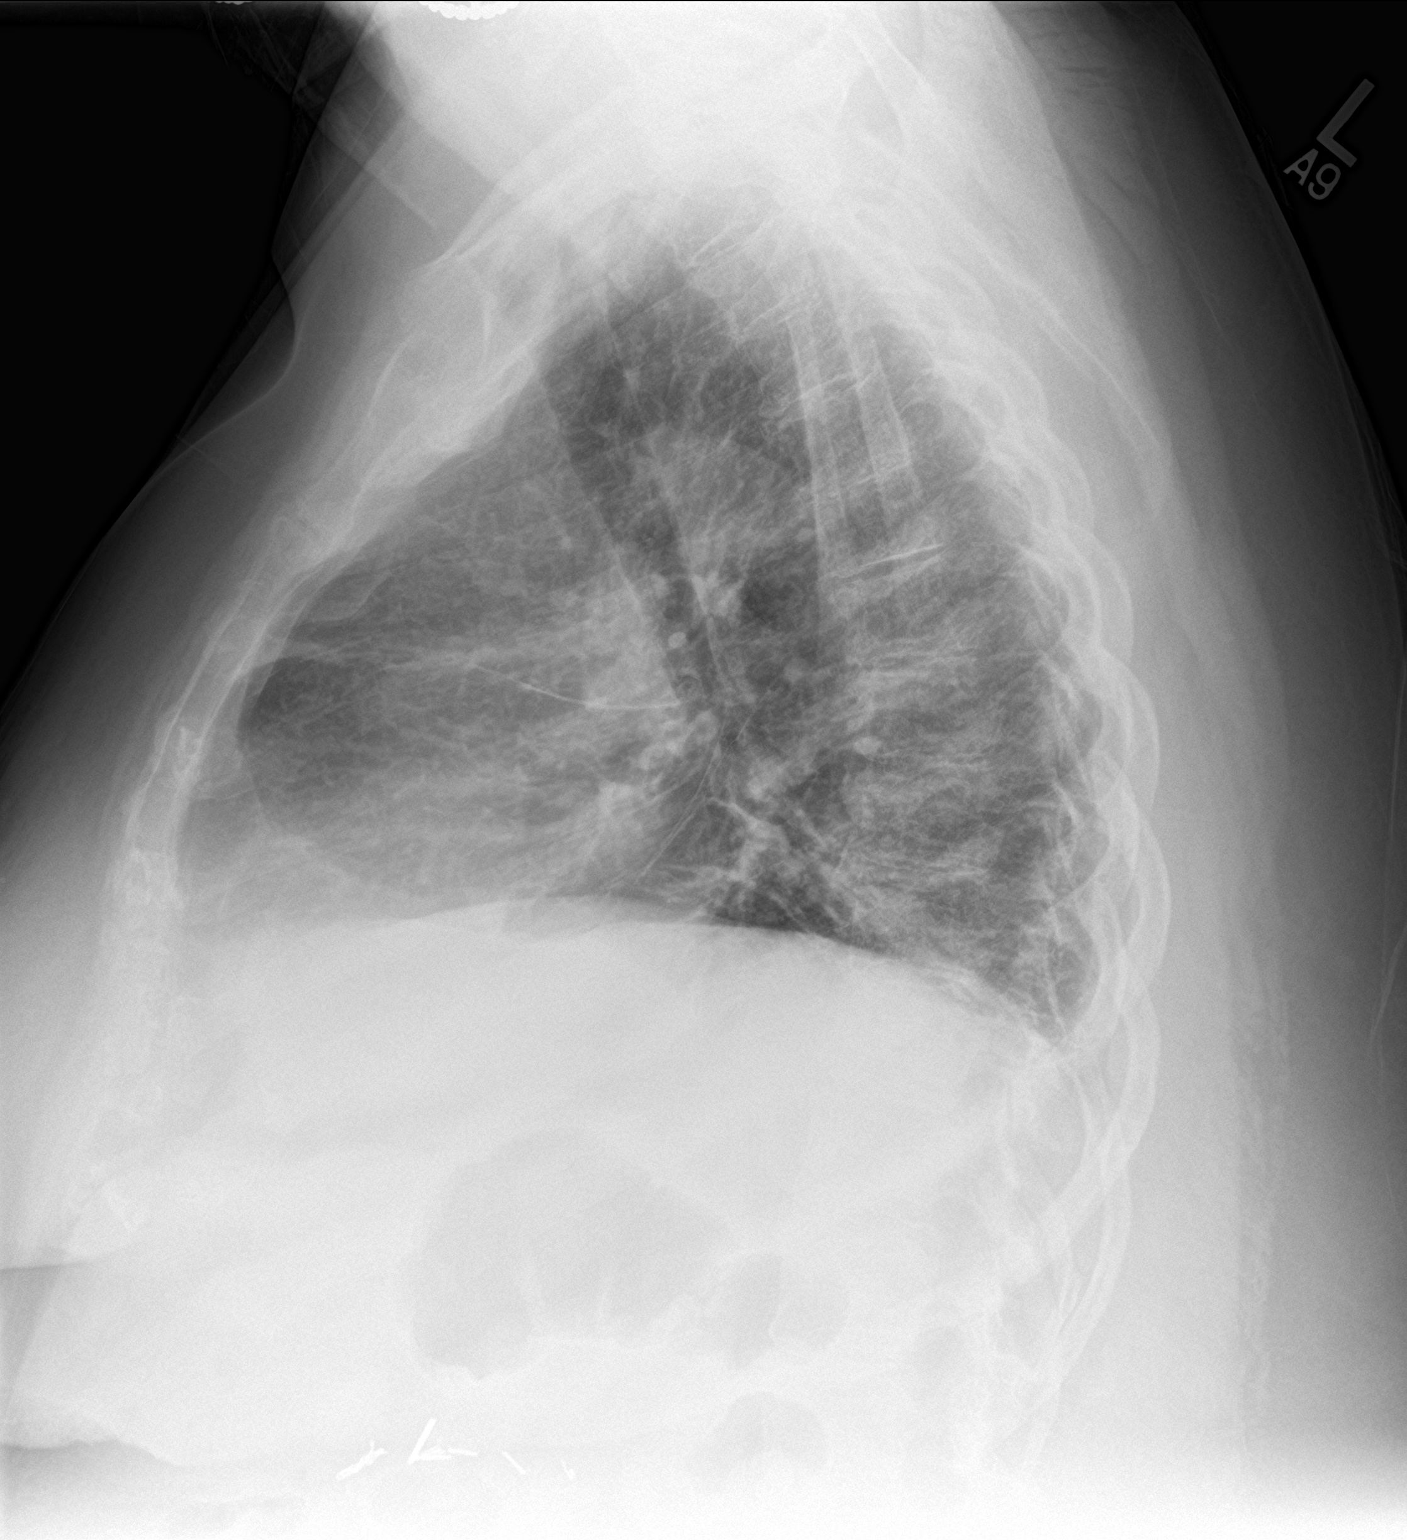

[2 of 2 positions shown; findings below may reference images not displayed]

FINDINGS: Heart size is within normal limits. There is minimal bibasilar
atelectasis. There are no focal consolidations or pleural effusions.
Surgical clips are seen in the right upper quadrant the abdomen.

No pulmonary nodules are identified.
IMPRESSION: 1. Minimal bibasilar atelectasis.
2. History raises question of pulmonary nodule. However no pulmonary
nodules have been described on recent imaging studies. Correlation
is recommended with any outside imaging.

## 2018-02-21 ENCOUNTER — Encounter: Payer: Self-pay | Admitting: Physician Assistant

## 2018-02-21 ENCOUNTER — Ambulatory Visit (INDEPENDENT_AMBULATORY_CARE_PROVIDER_SITE_OTHER): Payer: Medicare HMO | Admitting: Physician Assistant

## 2018-02-21 VITALS — BP 137/70 | HR 68 | Ht 59.0 in | Wt 164.2 lb

## 2018-02-21 DIAGNOSIS — R1012 Left upper quadrant pain: Secondary | ICD-10-CM

## 2018-02-21 DIAGNOSIS — G894 Chronic pain syndrome: Secondary | ICD-10-CM

## 2018-02-21 DIAGNOSIS — K219 Gastro-esophageal reflux disease without esophagitis: Secondary | ICD-10-CM | POA: Diagnosis not present

## 2018-02-21 DIAGNOSIS — K3184 Gastroparesis: Secondary | ICD-10-CM

## 2018-02-21 DIAGNOSIS — Z86010 Personal history of colonic polyps: Secondary | ICD-10-CM

## 2018-02-21 DIAGNOSIS — K5730 Diverticulosis of large intestine without perforation or abscess without bleeding: Secondary | ICD-10-CM | POA: Diagnosis not present

## 2018-02-21 DIAGNOSIS — K581 Irritable bowel syndrome with constipation: Secondary | ICD-10-CM

## 2018-02-21 MED ORDER — ONDANSETRON HCL 4 MG PO TABS: ORAL_TABLET | ORAL | 1 refills | Status: DC

## 2018-02-21 MED ORDER — DICLOFENAC SODIUM 1 % TD GEL: 2.0000 g | Freq: Four times a day (QID) | TRANSDERMAL | 3 refills | Status: DC

## 2018-02-21 NOTE — Progress Notes (Addendum)
Subjective:    Patient ID: [name], female    DOB: 02-25-1951, 67 y.o.   MRN: [id number]  HPI [name] is a pleasant 67 year old African-American female, known to Dr. Hilarie Fredrickson.  She was last seen in the office about a month ago and is brought back today for follow-up.  She has history of hypertension, pulmonary hypertension, sleep apnea, COPD, GERD, IBS, diverticulosis, history of colon polyps, adult onset diabetes mellitus insulin-dependent with polyneuropathy, bipolar disorder and prior CVA with left hemiparesis as well as seizure disorder. Had chronic complaints of left-sided abdominal pain as well as chronic abdominal bloating and carries diagnosis of gastroparesis..  At the time of last office visit it was felt that her chronic left-sided abdominal complaints were most likely secondary to IBS/ongoing constipation.  Was to resume MiraLAX and increase to twice daily, IBgard was added to her regimen and she was asked to stop Bentyl.  She is to continue AcipHex and a gastroparesis diet. Been having ongoing problems with back pain as well She had CT of her chest done in November which showed scattered active atelectasis but was otherwise unremarkable I of the thoracic spine and lumbar spine in August 2019, showed diffuse degenerative disc disease in the thoracic spine without disc protrusion or neural impingement, and in the lumbar spine there is severe foraminal narrowing on the right L4-L5 which had worsened, she has had prior decompression at L3-L4.  Today she feels her left-sided pain is about the same, she ran out of MiraLAX and IBgard within the past week or so.  She has been having bowel movements.  She says that her abdomen is always tender even to light pressure and that she cannot stand wearing any clothing over her abdomen.  She continues to feel bloated.  Appetite has been fine and is actually up 2 pounds.  She gets occasional nausea but has not had any vomiting.  Review of Systems Pertinent  positive and negative review of systems were noted in the above HPI section.  All other review of systems was otherwise negative.  Outpatient Encounter Medications as of 02/21/2018  Medication Sig  . ACCU-CHEK FASTCLIX LANCETS MISC Use to check blood sugar 4 times a day  . albuterol (PROVENTIL HFA;VENTOLIN HFA) 108 (90 Base) MCG/ACT inhaler Inhale 1-2 puffs every 6 hours as needed for wheezing, shortness of breath  . amLODipine (NORVASC) 5 MG tablet Take 1 tablet (5 mg total) by mouth daily.  [name] ascorbic acid (VITAMIN C) 500 MG tablet Take 500 mg by mouth daily.  [name] aspirin EC 81 MG tablet Take 81 mg by mouth daily.  . Blood Glucose Monitoring Suppl (ACCU-CHEK AVIVA) device Use as instructed  . cetirizine (ZYRTEC) 10 MG tablet Take 1 tablet (10 mg total) by mouth daily.  . Cholecalciferol (VITAMIN D3) 5000 units CAPS Take 1 capsule by mouth daily.  . cloNIDine (CATAPRES) 0.2 MG tablet One tablet at bedtime for blood pressure  . docusate sodium (COLACE) 100 MG capsule Take 100 mg by mouth 2 (two) times daily.  . DROPLET PEN NEEDLES 32G X 4 MM MISC USE  TO INJECT FOUR TIMES DAILY  . glucose blood (ACCU-CHEK AVIVA) test strip Use to check blood sugar 4 times a day  . hydroxychloroquine (PLAQUENIL) 200 MG tablet Take 200 mg by mouth daily.   . insulin aspart (NOVOLOG FLEXPEN) 100 UNIT/ML FlexPen INJECT 10 TO 14 UNITS INTO THE SKIN 3 (THREE) TIMES DAILY WITH MEALS.  [name] Insulin Glargine (TOUJEO SOLOSTAR) 300 UNIT/ML  SOPN Inject 20 Units into the skin at bedtime.  . lamoTRIgine (LAMICTAL) 100 MG tablet Take 1 tablet (100 mg total) by mouth 2 (two) times daily.  [name] levothyroxine (SYNTHROID, LEVOTHROID) 50 MCG tablet TAKE 1 TABLET  DAILY  EXCEPT TAKE 1/2 TABLET  ON  SUNDAY  . LORazepam (ATIVAN) 0.5 MG tablet Take 1 tablet (0.5 mg total) by mouth 2 (two) times daily.  [name] losartan (COZAAR) 50 MG tablet Take 1 tablet (50 mg total) by mouth daily.  . metoprolol tartrate (LOPRESSOR) 50 MG tablet TAKE 1 TABLET  TWICE DAILY (NEED MD APPOINTMENT)  . montelukast (SINGULAIR) 10 MG tablet TAKE 1 TABLET EVERY DAY  . Multiple Vitamins-Minerals (HM MULTIVITAMIN ADULT GUMMY PO) Take 1 tablet by mouth daily.  . Nicotine (NICOTROL NS) 10 MG/ML SOLN Use 1 spray per nostril up to eight times per day as needed  . nystatin (MYCOSTATIN/NYSTOP) powder APPLY TO AFFECTED AREA 4 TIMES DAILY.  [name] oxyCODONE-acetaminophen (PERCOCET/ROXICET) 5-325 MG tablet Take 1 tablet by mouth every 6 (six) hours as needed.   . pregabalin (LYRICA) 75 MG capsule Take 1 capsule (75 mg total) by mouth daily.  . RABEprazole (ACIPHEX) 20 MG tablet Take 1 tablet (20 mg total) by mouth 2 (two) times daily.  . RESTASIS 0.05 % ophthalmic emulsion Place 1 drop into both eyes 2 (two) times daily.   . risperiDONE (RISPERDAL) 0.5 MG tablet Take 1 tablet (0.5 mg total) by mouth at bedtime.  . rosuvastatin (CRESTOR) 5 MG tablet TAKE 1 TABLET AT BEDTIME  . sertraline (ZOLOFT) 100 MG tablet Take 1 tablet (100 mg total) by mouth daily.  . traZODone (DESYREL) 150 MG tablet Take 0.5 tablets (75 mg total) by mouth at bedtime.  . triamcinolone ointment (KENALOG) 0.1 % Apply 1 application topically at bedtime. Apply to elbows at bedtime.  [name] UNABLE TO FIND Transport Chair x 1  Dx-bilateral leg weakness, frequent falls  . UNABLE TO FIND Rolling walker with seat x 1  DX. Bilateral lower extremity weakness, frequent falls, instability  . [DISCONTINUED] diclofenac sodium (VOLTAREN) 1 % GEL Apply 2 g topically daily as needed (pain). Rubs "wherever there is pain" when she doesn't take her pain pill.  . diclofenac sodium (VOLTAREN) 1 % GEL Apply 2 g topically 4 (four) times daily.  . ondansetron (ZOFRAN) 4 MG tablet Take 1 tablet by mouth every 6 hours as needed for nausea.  . [DISCONTINUED] diclofenac sodium (VOLTAREN) 1 % GEL Apply 2 g topically 4 (four) times daily.   No facility-administered encounter medications on file as of 02/21/2018.    Allergies  Allergen  Reactions  . Cephalexin Hives  . Iron Nausea And Vomiting  . Milk-Related Compounds Other (See Comments)    Doesn't agree with stomach.   . Penicillins Hives    Has patient had a PCN reaction causing immediate rash, facial/tongue/throat swelling, SOB or lightheadedness with hypotension: Yes Has patient had a PCN reaction causing severe rash involving mucus membranes or skin necrosis: No Has patient had a PCN reaction that required hospitalization No Has patient had a PCN reaction occurring within the last 10 years: No If all of the above answers are "NO", then may proceed with Cephalosporin use.   [name] Phenazopyridine Hcl Hives         Patient Active Problem List   Diagnosis Date Noted  . Lipoma of extremity 12/31/2017  . Weakness generalized   . Leg weakness, bilateral 10/28/2017  . Lumbar spondylosis with myelopathy 10/12/2017  .  At risk for cardiovascular event 06/10/2017  . Diabetic polyneuropathy associated with type 2 diabetes mellitus ([place]) 05/19/2016  . Tobacco user 04/24/2016  . History of palpitations 08/09/2015  . Nausea without vomiting 08/09/2015  . Labile hypertension 08/03/2015  . Normal coronary arteries 08/03/2015  . Left-sided low back pain with left-sided sciatica 06/27/2015  . Type 2 diabetes mellitus with hyperglycemia, with long-term current use of insulin ([place]) 05/06/2015  . Multinodular goiter 05/06/2015  . Rectocele, female 04/27/2015  . Anal sphincter incontinence 04/27/2015  . Pelvic relaxation due to rectocele 03/30/2015  . Pulmonary hypertension ([place]) 02/22/2015  . Light cigarette smoker (1-9 cigarettes per day) 01/11/2015  . Annual physical exam 01/05/2015  . Migraine without aura and without status migrainosus, not intractable 07/02/2014  . Flatulence 02/18/2014  . Microcytic anemia 02/18/2014  . COPD (chronic obstructive pulmonary disease) with chronic bronchitis ([place]) 09/16/2013  . Hypothyroidism 08/16/2013  . Gastroparesis 04/28/2013  .  Seizure disorder ([place]) 01/19/2013  . Cervical disc disorder with radiculopathy of cervical region 10/31/2012  . Solitary pulmonary nodule 08/19/2012  . Anemia 07/05/2012  . 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 Allergic sinusitis 04/18/2012  . Meningioma ([place]) 11/19/2011  . 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 Carpal tunnel syndrome of right wrist 05/23/2011  . Polypharmacy 04/28/2011  . Bipolar disorder ([place]) 04/28/2011  . Constipation 04/13/2011  . Falls frequently 12/12/2010  . Oropharyngeal dysphagia 07/12/2010  . Urinary incontinence 12/16/2009  . HEARING LOSS 10/26/2009  . Hyperlipidemia 12/11/2008  . IBS 12/11/2008  . GERD 07/29/2008  . MILK PRODUCTS ALLERGY 07/29/2008  . 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 Essential hypertension 06/27/2007  . Backache 06/19/2007  . Osteoporosis 06/19/2007  . Obstructive sleep apnea 06/19/2007  . TRIGGER FINGER 04/18/2007  . DIVERTICULOSIS, COLON 11/13/2006   Social History   Socioeconomic History  . Marital status: Divorced    Spouse name: Not on file  . Number of children: 1  . Years of education: 15  . Highest education level: Not on file  Occupational History  . Occupation: Disabled  Social Needs  . Financial resource strain: Not very hard  . Food insecurity:    Worry: Never true    Inability: Never true  . Transportation needs:    Medical: No    Non-medical: Not on file  Tobacco Use  . Smoking status: Current Every Day Smoker    Packs/day: 0.50    Types: Cigarettes  . Smokeless tobacco: Never Used  Substance and Sexual Activity  . Alcohol use: No    Alcohol/week: 0.0 standard drinks    Comment:    . Drug use: No  . Sexual activity: Not Currently  Lifestyle  . Physical activity:    Days per week: 0 days    Minutes per session: 0 min  . Stress: Only a little  Relationships  . Social connections:    Talks on phone: More than three times a week      Gets together: Never    Attends religious service: 1 to 4 times per year    Active member of club or organization: No    Attends meetings of clubs or organizations: Never    Relationship status: Divorced  . Intimate partner violence:    Fear of current or ex partner: No    Emotionally abused: No    Physically abused: No    Forced sexual activity: No  Other Topics Concern  . Not on  file  Social History Narrative   01/29/18 Lives alone, has 3 aides, Mon- Fri 8 hrs, 2 hrs on Sat-Sun, RN manages her meds   Caffeine use: Drink coffee sometimes    Right handed     Ms. Bunker's family history includes Alcohol abuse in her maternal uncle; Cancer in her sister and sister; Diabetes in her brother, brother, and father; Heart attack in her maternal grandfather and mother; Hypertension in her brother and son; Kidney failure in her father; Pancreatic cancer in her sister; Pneumonia in her father; Sleep apnea in her son; Stroke in her maternal grandmother.      Objective:    Vitals:   02/21/18 1355  BP: 137/70  Pulse: 68  SpO2: 94%    Physical Exam; well-developed older African-American female in no acute distress, she ambulates with a walker, height 4 foot 11, weight 164, BMI 33.1.  HEENT; nontraumatic normocephalic EOMI PERRLA sclera anicteric oral mucosa moist, Cardiovascular; regular rate and rhythm with S1-S2, Pulmonary; kyphotic, clear.  Abdomen ;soft, bowel sounds are present, she has tenderness with light palpation in the left mid left upper quadrant radiating towards the left flank, there is no palpable mass or hepatosplenomegaly no guarding or rebound pressure pull abdominal distention.  Rectal; exam not done, Extremities; no clubbing cyanosis or edema skin warm dry, Neuropsych alert and oriented, grossly nonfocal mood and affect appropriate       Assessment & Plan:   #81 67 year old African-American female with chronic left-sided abdominal pain.  She carries diagnosis of IBS,  chronic constipation and has had prior adenomatous polyps. She has not had much response to adjustments in her GI regimen.  I suspect her pain is actually abdominal wall related last musculoskeletal in etiology.  She was reassured today that though she has a chronic pain may continue to have pain on the left side of her abdomen that we have not found anything severe or worrisome from a GI perspective.  2 gastroparesis #3 insulin-dependent diabetes mellitus with polyneuropathy #4 GERD stable #5 COPD 6.  Status post prior CVA with left hemiparesis 7.  Chronic back pain-multilevel disease 8.  Bipolar disorder 9.  Seizure disorder 10.  Diverticulosis  Plan; patient is asked to resume MiraLAX on a daily basis, at least once daily. Resume IBgard 2 p.o. twice daily Continue AcipHex 20 mg twice daily We discussed a gastroparesis diet step 3 and she was given a copy. She has tried lidocaine cream topically without much benefit.  Sometimes finds a heating pad helpful. She will continue local heat as needed, have also given her a prescription for Voltaren gel which she is used in the past apply 3 times daily as needed. She will be due for follow-up colonoscopy in 2022.  She will follow-up with Dr. Hilarie Fredrickson or myself on an as-needed basis.  Amy Genia Harold PA-C 02/21/2018   Cc: [name], MD  Addendum: Reviewed and agree with assessment and management plan. Pyrtle, Lajuan Lines, MD

## 2018-02-21 NOTE — Patient Instructions (Addendum)
We sent prescriptions to [facility]. , [address].  1. Zofran 4 mg 2. Diclofenac 1 % gel,.   Continue Achiphex 20 mg, take 1 tab by mouth twice daily. Restart Miralax 17 grams in 8 oz of water daily.  Stay of Bentyl ( dicyclomine).   We have given y ou samples of IB GARD. You can get this at the pharmacy or [facility].  We have given you a handout on a Gastroparesis diet.  Follow up with Dr. Hilarie Fredrickson as needed.

## 2018-02-22 ENCOUNTER — Ambulatory Visit (HOSPITAL_COMMUNITY): Payer: Self-pay | Admitting: Internal Medicine

## 2018-02-25 ENCOUNTER — Other Ambulatory Visit: Payer: Self-pay

## 2018-02-25 ENCOUNTER — Encounter: Payer: Self-pay | Admitting: Internal Medicine

## 2018-02-25 ENCOUNTER — Ambulatory Visit (INDEPENDENT_AMBULATORY_CARE_PROVIDER_SITE_OTHER): Payer: Medicare HMO | Admitting: Internal Medicine

## 2018-02-25 VITALS — BP 140/68 | HR 64 | Ht 59.0 in | Wt 164.8 lb

## 2018-02-25 DIAGNOSIS — R260 Ataxic gait: Secondary | ICD-10-CM | POA: Diagnosis not present

## 2018-02-25 DIAGNOSIS — M4716 Other spondylosis with myelopathy, lumbar region: Secondary | ICD-10-CM | POA: Diagnosis not present

## 2018-02-25 DIAGNOSIS — Z794 Long term (current) use of insulin: Secondary | ICD-10-CM | POA: Diagnosis not present

## 2018-02-25 DIAGNOSIS — Q7649 Other congenital malformations of spine, not associated with scoliosis: Secondary | ICD-10-CM | POA: Diagnosis not present

## 2018-02-25 DIAGNOSIS — J449 Chronic obstructive pulmonary disease, unspecified: Secondary | ICD-10-CM | POA: Diagnosis not present

## 2018-02-25 DIAGNOSIS — Z720 Tobacco use: Secondary | ICD-10-CM

## 2018-02-25 DIAGNOSIS — G4733 Obstructive sleep apnea (adult) (pediatric): Secondary | ICD-10-CM | POA: Diagnosis not present

## 2018-02-25 DIAGNOSIS — M542 Cervicalgia: Secondary | ICD-10-CM | POA: Diagnosis not present

## 2018-02-25 DIAGNOSIS — M5136 Other intervertebral disc degeneration, lumbar region: Secondary | ICD-10-CM | POA: Diagnosis not present

## 2018-02-25 DIAGNOSIS — M5442 Lumbago with sciatica, left side: Secondary | ICD-10-CM | POA: Diagnosis not present

## 2018-02-25 DIAGNOSIS — F060 Psychotic disorder with hallucinations due to known physiological condition: Secondary | ICD-10-CM | POA: Diagnosis not present

## 2018-02-25 DIAGNOSIS — E119 Type 2 diabetes mellitus without complications: Secondary | ICD-10-CM | POA: Diagnosis not present

## 2018-02-25 MED ORDER — ALBUTEROL SULFATE HFA 108 (90 BASE) MCG/ACT IN AERS: INHALATION_SPRAY | RESPIRATORY_TRACT | 3 refills | Status: DC

## 2018-02-25 NOTE — Progress Notes (Signed)
HPI F Former smoker followed for obstructive sleep apnea with hypersomnia, , R/O'd Lung nodule,Wheezing dyspnea, complicated by hx resection of meningioma. Bipolar, GERD, DM, chronic epistaxis NPSG 3/8/13[name]- mild obstructive sleep apnea, AHI 14 per hour, mostly in REM. Epworth sleepiness score 14/24. Weight 164 pounds. [name] is living alone, divorced and disabled. Son has OSA. History of meningioma 11/23/2011. No seizure in over 3 years. Does not drive Office Spirometry 10/30/2013-submaximal effort based on appearance of loop and curve. Numbers would fit with severe restriction but her physiologic capability may be better than this. FVC 0.91/44%, FEV1 0.74/45%, FEV1/FVC 0.81, FEF 25-75% 1.43/69%. Office Spirometry 07/27/2015-weak effort but still better than her last previous trial effort. Mild restriction of exhaled volume. FVC 1.35/67%, FEV1 1.27/79%, FEV1/FVC 0.94, FEF 25-75 percent 2.14/107% -------------------------------------------------------------------------------------------------------------  08/23/2017-  67 yoF smoker followed for obstructive sleep apnea with hypersomnia, , R/O'd Lung nodule,Wheezing dyspnea, complicated by hx resection of meningioma. Bipolar, GERD, DM, chronic epistaxis CPAP 5-15 auto/ [employer] Download 7% compliance, AHI 66/ hr. Reviewed with her. -----OSA. [name] forgets to use CPAP some nights. [name] is still having issues with nosebleeds, sometimes wakes up in the night with them.  Has been to ER with epistaxis and followed by Dr Teoh/ ENT.  Likes nasal pillows mask. Remains sleepy. [name] can't articulate why [name] doesn't use CPAP more . Not trying to reduce smoking- discussed CXR 05/19/2017 Bronchitic changes.  02/25/18- 67 yoF smoker followed for obstructive sleep apnea with hypersomnia, , R/O'd Lung nodule,Wheezing dyspnea, complicated by hx resection of meningioma. Bipolar, GERD, DM, chronic epistaxis CPAP 5-15 auto/ [employer] Download 10% compliance AHI  9.4/hour Admits to smoking 5-10 cigarettes daily.  Has Chantix but is trying to stop without using it. -----OSA: DME [employer]. According to most recent DL (attached) pt is not wearing CPAP as [name] should. Pt states [name] has not been contacted by RT at  to help with her machine.  [name] says her CPAP machine cuts off during the night. Cauterized epistaxis.  Dyspnea on exertion is about at baseline with little cough. Says [name] has another brain tumor-presumably meningioma again.  Being followed by Hematology for anemia. CT chest 01/23/2018- Scattered atherosclerotic calcifications in the aorta and coronary arteries. Scattered atelectasis in both lungs without acute infiltrate. No acute intrathoracic abnormalities. Aortic Atherosclerosis (ICD10-I70.0).   ROS-see HPI + = positive Constitutional:   No-   weight loss, night sweats, fevers, chills, +fatigue, lassitude. HEENT:  + headaches, +difficulty swallowing, tooth/dental problems, sore throat,       No-sneezing, itching, ear ache, nasal congestion, post nasal drip,  CV:  chest pain, orthopnea, PND, swelling in lower extremities, anasarca, dizziness, +palpitations Resp: +  shortness of breath with exertion or at rest.                productive cough,   non-productive cough,  No- coughing up of blood.              No-   change in color of mucus.   wheezing.   Skin: No-   rash or lesions. GI:  +heartburn, +indigestion, no-abdominal pain, nausea, vomiting,  GU:  MS:  + joint pain or swelling.   Neuro-     nothing unusual Psych:  No- change in mood or affect. +depression or +anxiety.  No memory loss.  OBJ- Physical Exam - + [name] speaks slowly but seems alert and appropriate, neatly dressed today General-  Oriented, Affect-flat, Distress- none acute Skin- rash-none, lesions- none, excoriation- none Lymphadenopathy- none  Head- Eyes- Gross vision intact, PERRLA, conjunctivae and secretions clear            Ears- Hearing, canals-normal + =  positive            Nose- no-Septal dev, polyps, erosion, perforation             Throat- Mallampati III , mucosa clear , drainage- none, tonsils- atrophic, + dentures Neck- flexible , trachea midline, no stridor , thyroid nl, carotid no bruit Chest - symmetrical excursion , unlabored           Heart/CV- RRR , no murmur , no gallop  , no rub, nl s1 s2                           - JVD- none , edema- none, stasis changes- none, varices- none           Lung- clear to P&A, wheeze- none, cough- mild , dullness-none, rub- none           Chest wall-  Abd-  Br/ Gen/ Rectal- Not done, not indicated Extrem- cyanosis- none, clubbing, none, atrophy- none, strength- nl. +cane Neuro- + seems fully oriented but vague. Rocking in chair.

## 2018-02-25 NOTE — Patient Outreach (Signed)
[facility]) Care Management  02/25/2018   [name] 01-Jun-1950 [id number]  Subjective: Successful telephone outreach to the patient for assessment.  HIPAA verified  The patient states that she is doing well.  She denies any falls.  She states that she has chronic pain in her back and rates that pain at 7/10.  She states that she has been dealing with a nose bleed.  She went to see Dr. Wendie Simmer the bleeding was resolved and she had a sinus infection.  She states that her blood pressure has been up and down.  Today it was 140/60.  She has an appointment with her heart doctor on 03/14/18. She states that she has been monitoring her diet.  Her a1c was 5.1 and she reports that the doctor was very pleased. She is trying to stop smoking.  The patient reports that she is being fitted for braces on her legs to help stabilize her.     Current Medications:  Current Outpatient Medications  Medication Sig Dispense Refill  . ACCU-CHEK FASTCLIX LANCETS MISC Use to check blood sugar 4 times a day 306 each 0  . albuterol (PROVENTIL HFA;VENTOLIN HFA) 108 (90 Base) MCG/ACT inhaler Inhale 1-2 puffs every 6 hours as needed for wheezing, shortness of breath 3 Inhaler 3  . amLODipine (NORVASC) 5 MG tablet Take 1 tablet (5 mg total) by mouth daily. 90 tablet 1  . ascorbic acid (VITAMIN C) 500 MG tablet Take 500 mg by mouth daily.    [name] aspirin EC 81 MG tablet Take 81 mg by mouth daily.    . Blood Glucose Monitoring Suppl (ACCU-CHEK AVIVA) device Use as instructed 1 each 0  . cetirizine (ZYRTEC) 10 MG tablet Take 1 tablet (10 mg total) by mouth daily. 7 tablet 0  . Cholecalciferol (VITAMIN D3) 5000 units CAPS Take 1 capsule by mouth daily.    . cloNIDine (CATAPRES) 0.2 MG tablet One tablet at bedtime for blood pressure 90 tablet 3  . diclofenac sodium (VOLTAREN) 1 % GEL Apply 2 g topically 4 (four) times daily. 1 Tube 3  . docusate sodium (COLACE) 100 MG capsule Take 100 mg by mouth 2 (two) times  daily.    . DROPLET PEN NEEDLES 32G X 4 MM MISC USE  TO INJECT FOUR TIMES DAILY 400 each 0  . glucose blood (ACCU-CHEK AVIVA) test strip Use to check blood sugar 4 times a day 400 each 2  . hydroxychloroquine (PLAQUENIL) 200 MG tablet Take 200 mg by mouth daily.     . insulin aspart (NOVOLOG FLEXPEN) 100 UNIT/ML FlexPen INJECT 10 TO 14 UNITS INTO THE SKIN 3 (THREE) TIMES DAILY WITH MEALS. 45 mL 3  . Insulin Glargine (TOUJEO SOLOSTAR) 300 UNIT/ML SOPN Inject 20 Units into the skin at bedtime. 9 pen 3  . lamoTRIgine (LAMICTAL) 100 MG tablet Take 1 tablet (100 mg total) by mouth 2 (two) times daily. 180 tablet 2  . levothyroxine (SYNTHROID, LEVOTHROID) 50 MCG tablet TAKE 1 TABLET  DAILY  EXCEPT TAKE 1/2 TABLET  ON  SUNDAY 85 tablet 1  . LORazepam (ATIVAN) 0.5 MG tablet Take 1 tablet (0.5 mg total) by mouth 2 (two) times daily. 180 tablet 2  . losartan (COZAAR) 50 MG tablet Take 1 tablet (50 mg total) by mouth daily. 90 tablet 1  . metoprolol tartrate (LOPRESSOR) 50 MG tablet TAKE 1 TABLET TWICE DAILY (NEED MD APPOINTMENT) 60 tablet 3  . montelukast (SINGULAIR) 10 MG tablet TAKE 1 TABLET EVERY  DAY 90 tablet 1  . Multiple Vitamins-Minerals (HM MULTIVITAMIN ADULT GUMMY PO) Take 1 tablet by mouth daily.    . Nicotine (NICOTROL NS) 10 MG/ML SOLN Use 1 spray per nostril up to eight times per day as needed 10 mL 0  . nystatin (MYCOSTATIN/NYSTOP) powder APPLY TO AFFECTED AREA 4 TIMES DAILY. 30 g 2  . ondansetron (ZOFRAN) 4 MG tablet Take 1 tablet by mouth every 6 hours as needed for nausea. 50 tablet 1  . oxyCODONE-acetaminophen (PERCOCET/ROXICET) 5-325 MG tablet Take 1 tablet by mouth every 6 (six) hours as needed.     . pregabalin (LYRICA) 75 MG capsule Take 1 capsule (75 mg total) by mouth daily.    . RABEprazole (ACIPHEX) 20 MG tablet Take 1 tablet (20 mg total) by mouth 2 (two) times daily. 180 tablet 3  . RESTASIS 0.05 % ophthalmic emulsion Place 1 drop into both eyes 2 (two) times daily.     .  risperiDONE (RISPERDAL) 0.5 MG tablet Take 1 tablet (0.5 mg total) by mouth at bedtime. 90 tablet 2  . rosuvastatin (CRESTOR) 5 MG tablet TAKE 1 TABLET AT BEDTIME 90 tablet 1  . sertraline (ZOLOFT) 100 MG tablet Take 1 tablet (100 mg total) by mouth daily. 90 tablet 2  . traZODone (DESYREL) 150 MG tablet Take 0.5 tablets (75 mg total) by mouth at bedtime. 135 tablet 2  . triamcinolone ointment (KENALOG) 0.1 % Apply 1 application topically at bedtime. Apply to elbows at bedtime.    [name] UNABLE TO FIND Transport Chair x 1  Dx-bilateral leg weakness, frequent falls 1 each 0  . UNABLE TO FIND Rolling walker with seat x 1  DX. Bilateral lower extremity weakness, frequent falls, instability 1 each 0   No current facility-administered medications for this visit.     Functional Status:  In your present state of health, do you have any difficulty performing the following activities: 01/31/2018 10/30/2017  Hearing? [place]? Y -  Difficulty concentrating or making decisions? Y -  Walking or climbing stairs? Y -  Dressing or bathing? Y -  Doing errands, shopping? Y N  Preparing Food and eating ? Y -  Using the Toilet? N -  In the past six months, have you accidently leaked urine? Y -  Do you have problems with loss of bowel control? N -  Managing your Medications? Y -  Managing your Finances? Y -  Housekeeping or managing your Housekeeping? Y -  Some recent data might be hidden    Fall/Depression Screening: Fall Risk  02/25/2018 01/31/2018 01/29/2018  Falls in the past year? 0 1 1  Number falls in past yr: - 1 1  Comment - - -  Injury with Fall? - 1 1  Comment - - bruises  Risk Factor Category  - - -  Risk for fall due to : - History of fall(s);Medication side effect;Impaired balance/gait;Impaired mobility;Impaired vision Impaired balance/gait;Impaired mobility  Follow up - Falls prevention discussed -   PHQ 2/9 Scores 01/31/2018 11/20/2017 10/08/2017 08/08/2017 06/29/2017 06/28/2017  04/30/2017  PHQ - 2 Score 1 3 3 2 1 1 1   PHQ- 9 Score - 6 16 5  - - -  Some encounter information is confidential and restricted. Go to Review Flowsheets activity to see all data.    Assessment: Patient will continue to benefit from health coach outreach for disease management and support.  [facility] CM Care Plan Problem One     Most Recent Value  [facility] Long Term Goal   In 60 days the patient will verbalize maintaining her blood pressure and no hospital visits  [facility] Long Term Goal Start Date  02/25/18  Interventions for Problem One Long Term Goal  reviewed signs and symptoms, diet and medication management     Plan: [facility] will contact patient in the month of February and patient agrees to next outreach.   [name] RN, BSN, [facility] Direct Dial:  [phone]  Fax: [phone]

## 2018-02-25 NOTE — Patient Instructions (Signed)
Please try hard to stop smoking  Script sent refilling albuterol rescue inhaler to use 2 puffs every 6 hours, only if needed  Sample Anoro inhaler    Inhale 1 puff daily    If this really seems to help your breathing, please call us for a prescription  Order- D[facility]  Please replace old CPAP machine, auto 5-15, mask of choice, humidifier, supplies, airView  Please call us as needed

## 2018-02-26 ENCOUNTER — Encounter (HOSPITAL_COMMUNITY): Payer: Self-pay | Admitting: Hematology

## 2018-02-26 ENCOUNTER — Encounter (HOSPITAL_COMMUNITY): Payer: Self-pay | Admitting: Internal Medicine

## 2018-02-26 ENCOUNTER — Inpatient Hospital Stay (HOSPITAL_BASED_OUTPATIENT_CLINIC_OR_DEPARTMENT_OTHER): Payer: Medicare HMO | Admitting: Internal Medicine

## 2018-02-26 VITALS — BP 161/60 | HR 70 | Temp 99.1°F | Resp 18 | Wt 161.7 lb

## 2018-02-26 DIAGNOSIS — D509 Iron deficiency anemia, unspecified: Secondary | ICD-10-CM

## 2018-02-26 DIAGNOSIS — N189 Chronic kidney disease, unspecified: Secondary | ICD-10-CM | POA: Diagnosis not present

## 2018-02-26 DIAGNOSIS — D500 Iron deficiency anemia secondary to blood loss (chronic): Secondary | ICD-10-CM

## 2018-02-26 DIAGNOSIS — I10 Essential (primary) hypertension: Secondary | ICD-10-CM | POA: Diagnosis not present

## 2018-02-26 DIAGNOSIS — F17210 Nicotine dependence, cigarettes, uncomplicated: Secondary | ICD-10-CM | POA: Diagnosis not present

## 2018-02-26 DIAGNOSIS — I700 Atherosclerosis of aorta: Secondary | ICD-10-CM | POA: Diagnosis not present

## 2018-02-26 DIAGNOSIS — Z90710 Acquired absence of both cervix and uterus: Secondary | ICD-10-CM

## 2018-02-26 DIAGNOSIS — D1779 Benign lipomatous neoplasm of other sites: Secondary | ICD-10-CM | POA: Diagnosis not present

## 2018-02-26 DIAGNOSIS — E119 Type 2 diabetes mellitus without complications: Secondary | ICD-10-CM | POA: Diagnosis not present

## 2018-02-26 DIAGNOSIS — Z794 Long term (current) use of insulin: Secondary | ICD-10-CM | POA: Diagnosis not present

## 2018-02-26 DIAGNOSIS — Z7982 Long term (current) use of aspirin: Secondary | ICD-10-CM

## 2018-02-26 DIAGNOSIS — D631 Anemia in chronic kidney disease: Secondary | ICD-10-CM | POA: Diagnosis not present

## 2018-02-26 DIAGNOSIS — Z79899 Other long term (current) drug therapy: Secondary | ICD-10-CM

## 2018-02-26 NOTE — Patient Instructions (Signed)
[facility] at [facility] Discharge Instructions  You were seen by Dr. Higgs today   Thank you for choosing [facility] at [facility] to provide your oncology and hematology care.  To afford each patient quality time with our provider, please arrive at least 15 minutes before your scheduled appointment time.   If you have a lab appointment with the Cancer Center please come in thru the  Main Entrance and check in at the main information desk  You need to re-schedule your appointment should you arrive 10 or more minutes late.  We strive to give you quality time with our providers, and arriving late affects you and other patients whose appointments are after yours.  Also, if you no show three or more times for appointments you may be dismissed from the clinic at the providers discretion.     Again, thank you for choosing [facility].  Our hope is that these requests will decrease the amount of time that you wait before being seen by our physicians.       _____________________________________________________________  Should you have questions after your visit to [facility], please contact our office at [phone] between the hours of 8:00 a.m. and 4:30 p.m.  Voicemails left after 4:00 p.m. will not be returned until the following business day.  For prescription refill requests, have your pharmacy contact our office and allow 72 hours.    Cancer Center Support Programs:   > Cancer Support Group  2nd Tuesday of the month 1pm-2pm, Journey Room   

## 2018-02-26 NOTE — Progress Notes (Signed)
Diagnosis No diagnosis found.  Staging Cancer Staging No matching staging information was found for the patient.  Assessment and Plan:  1. Microcytic anemia.  Pt has combination anemia from iron deficiency and chronic kidney disease.  She had Colonoscopy on 02/10/2016 showing 2 sessile polyps in the descending colon, diverticuli in sigmoid colon, EGD on 02/10/2016 showing mild to moderate esophagitis, erythema in the inferior gastric antrum.  Pt was last treated with IV iron in 07/2017.  Labs done 02/15/2018 reviewed and showed wbc 6.1 HB 10.2 plts 205,000.  Ferritin has trended downward and is 41.  Pt given option of repeat IV iron.   She is set up for Feraheme 510 mg IV D1 and D8.  She will RTC in 04/2018 for follow-up and repeat labs.    Pt had CT of the abdomen and pelvis done 06/28/2017 that showed  IMPRESSION: 1. No acute abnormality seen to explain the patient's symptoms. Small amount of stool noted in the colon, without CT evidence for constipation. 2. Mild diverticulosis along the descending and sigmoid colon, without evidence of diverticulitis.  Follow-up with GI as recommended.    2.  Smoking.  Cessation recommended.  She has > 30 pack yr history of smoking.  CT chest done 01/23/2018 reviewed and showed IMPRESSION: Scattered atherosclerotic calcifications in the aorta and coronary arteries.  Scattered atelectasis in both lungs without acute infiltrate.  No acute intrathoracic abnormalities.  Aortic Atherosclerosis (ICD10-I70.0).  Will consider repeat imaging in 1 year if ongoing smoking history.    3.  CVA.  Pt has some neurological deficits.  Follow-up with PCP and neurology as directed.    4.  Reported brain tumor.  Will ask for navigator to determine if pt has been diagnosed.  Review of chart shows MRI in 03/2017 and CT in 10/2017 showed evidence of meningioma.    25 minutes spent with more than 50% spent in counseling and coordination of care.    Interval  History: Historical data obtained from note dated 08/10/2017.  67 y.o. female is seen by Dr. Worthy Keeler on 06/26/2017 for evaluation  and management of microcytic anemia.  Labs done 05/19/2017 showed hemoglobin was 8.6 and MCV over 72.  White count and platelet count were within normal limits.  She reports feeling very fatigued and has occasional lightheadedness.  Her last blood transfusion was in 2012.  She was treated with intravenous iron in the past.  She denies any bleeding per rectum but has occasional dark stools.  She has been placed on iron pills for the last few months.  She gets severely constipated when she takes them. Last EGD and colonoscopy were in November 2017.  She lives by herself at home and walks with the help of walker.  She has an aide during the daytime, helping her with cooking and other daily chores.  Labs done 06/26/2017 showed HB 9.9 and ferritin 14.  She was treated with IV iron on 07/05/2017 and 07/12/2017.    Current Status:  Pt is seen for follow-up after IV iron.  She reports some mild SOB.  She is here to go over labs.  Pt reports she has been told by neurosurgery she has brain tumor.    Problem List Patient Active Problem List   Diagnosis Date Noted  . Lipoma of extremity [D17.79] 12/31/2017  . Weakness generalized [R53.1]   . Leg weakness, bilateral [R29.898] 10/28/2017  . Lumbar spondylosis with myelopathy [M47.16] 10/12/2017  . At risk for cardiovascular event [Z91.89] 06/10/2017  .  Diabetic polyneuropathy associated with type 2 diabetes mellitus ([place]) [E11.42] 05/19/2016  . Tobacco user [Z72.0] 04/24/2016  . History of palpitations [Z87.898] 08/09/2015  . Nausea without vomiting [R11.0] 08/09/2015  . Labile hypertension [R09.89] 08/03/2015  . Normal coronary arteries [Z03.89] 08/03/2015  . Left-sided low back pain with left-sided sciatica [M54.42] 06/27/2015  . Type 2 diabetes mellitus with hyperglycemia, with long-term current use of insulin ([place]) [E11.65, Z79.4]  05/06/2015  . Multinodular goiter [E04.2] 05/06/2015  . Rectocele, female [N81.6] 04/27/2015  . Anal sphincter incontinence [R15.9] 04/27/2015  . Pelvic relaxation due to rectocele [N81.6] 03/30/2015  . Pulmonary hypertension ([place]) [I27.20] 02/22/2015  . Light cigarette smoker (1-9 cigarettes per day) [F17.210] 01/11/2015  . Annual physical exam [G40.10] 01/05/2015  . Migraine without aura and without status migrainosus, not intractable [G43.009] 07/02/2014  . Flatulence [R14.3] 02/18/2014  . Microcytic anemia [D50.9] 02/18/2014  . COPD (chronic obstructive pulmonary disease) with chronic bronchitis ([place]) [J44.9] 09/16/2013  . Hypothyroidism [E03.9] 08/16/2013  . Gastroparesis [K31.84] 04/28/2013  . Seizure disorder ([place]) [U72.536] 01/19/2013  . Cervical disc disorder with radiculopathy of cervical region [M50.10] 10/31/2012  . Solitary pulmonary nodule [R91.1] 08/19/2012  . Anemia [D64.9] 07/05/2012  . Hypersomnia disorder related to a known organic factor [G47.10] 06/11/2012  . Allergic sinusitis [J30.9] 04/18/2012  . Meningioma ([place]) [D32.9] 11/19/2011  . Mononeuritis leg [G57.90] 10/25/2011  . Carpal tunnel syndrome of right wrist [G56.01] 05/23/2011  . Polypharmacy [Z79.899] 04/28/2011  . Bipolar disorder ([place]) [F31.9] 04/28/2011  . Constipation [K59.00] 04/13/2011  . Falls frequently [R29.6] 12/12/2010  . Oropharyngeal dysphagia [R13.12] 07/12/2010  . Urinary incontinence [R32] 12/16/2009  . HEARING LOSS [H91.90] 10/26/2009  . Hyperlipidemia [E78.5] 12/11/2008  . IBS [K58.9] 12/11/2008  . GERD [K21.9] 07/29/2008  . MILK PRODUCTS ALLERGY [Z91.011] 07/29/2008  . 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 Essential hypertension [I10] 06/27/2007  . Backache [M54.9] 06/19/2007  . Osteoporosis [M81.0] 06/19/2007  . Obstructive sleep apnea [G47.33] 06/19/2007  . TRIGGER FINGER [M71.50] 04/18/2007  . DIVERTICULOSIS, COLON [K57.30]  11/13/2006    Past Medical History Past Medical History:  Diagnosis Date  . Allergy   . Anemia   . Anxiety    takes Ativan daily  . Arthritis   . Assistance needed for mobility   . Bipolar disorder ([place])    takes Risperdal nightly  . Blood transfusion   . Cancer ([place])    In her gum  . Carpal tunnel syndrome of right wrist 05/23/2011  . Cervical disc disorder with radiculopathy of cervical region 10/31/2012  . Chronic back pain   . Chronic idiopathic constipation   . Chronic neck and back pain   . Colon polyps   . COPD (chronic obstructive pulmonary disease) with chronic bronchitis ([place]) 09/16/2013   Office Spirometry 10/30/2013-submaximal effort based on appearance of loop and curve. Numbers would fit with severe restriction but her physiologic capability may be better than this. FVC 0.91/44%, and 10.74/45%, FEV1/FVC 0.81, FEF 25-75% 1.43/69%    . Depression    takes Zoloft daily  . Diabetes mellitus    Type II  . Diverticulosis    TCS 9/08 by Dr. Delfin Edis for diarrhea . Bx for micro scopic colitis negative.   . Fibromyalgia   . Frequent falls   . GERD (gastroesophageal reflux disease)    takes Aciphex daily  . Glaucoma    eye drops daily  . Gum symptoms    infection on antibiotic  .  Hemiplegia affecting non-dominant side, post-stroke 08/02/2011  . Hyperlipidemia    takes Crestor daily  . Hypertension    takes Amlodipine,Metoprolol,and Clonidine daily  . Hypothyroidism    takes Synthroid daily  . IBS (irritable bowel syndrome)   . Insomnia    takes Trazodone nightly  . 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 Metabolic encephalopathy 7/61/6073  . Migraines    chronic headaches  . Mononeuritis lower limb   . Narcolepsy   . Osteoporosis   . Pancreatitis 2006   due to Depakote with normal EUS   . Schatzki's ring    non critical / EGD with ED 8/2011with RMR  . Seizures ([place])    takes Lamictal daily.Last seizure 3 yrs ago  . Sleep apnea    on CPAP  . Stroke [facility])     left sided weakness, speech changes  . Tubular adenoma of colon     Past Surgical History Past Surgical History:  Procedure Laterality Date  . ABDOMINAL HYSTERECTOMY  1978  . BACK SURGERY  July 2012  . BACTERIAL OVERGROWTH TEST N/A 05/05/2013   Procedure: BACTERIAL OVERGROWTH TEST;  Surgeon: Daneil Dolin, MD;  [facility];  Service: Endoscopy;  Laterality: N/A;  7:30  . BIOPSY THYROID  2009  . BRAIN SURGERY  11/2011   resection of meningioma  . BREAST REDUCTION SURGERY  1994  . CARDIAC CATHETERIZATION  05/10/2005   normal coronaries, normal LV systolic function and EF (Dr. Jackie Plum)  . CARPAL TUNNEL RELEASE Left 07/22/04   Dr. Aline Brochure  . CATARACT EXTRACTION Bilateral   . CHOLECYSTECTOMY  1984  . COLONOSCOPY N/A 09/25/2012   XTG:GYIRSWN diverticulosis.  colonic polyp-removed : tubular adenoma  . CRANIOTOMY  11/23/2011   Procedure: CRANIOTOMY TUMOR EXCISION;  Surgeon: Hosie Spangle, MD;  Location: [place] [facility] OR[facility];  Service: Neurosurgery;  Laterality: N/A;  Craniotomy for tumor resection  . ESOPHAGOGASTRODUODENOSCOPY  12/29/2010   Rourk-Retained food in the esophagus and stomach, small hiatal hernia, status post Maloney dilation of the esophagus  . ESOPHAGOGASTRODUODENOSCOPY N/A 09/25/2012   IOE:[id number] atonic baggy esophagus status post Maloney dilation 83 F. Hiatal hernia  . GIVENS CAPSULE STUDY N/A 01/15/2013   NORMAL.   . IR GENERIC HISTORICAL  03/17/2016   IR RADIOLOGIST EVAL & MGMT 03/17/2016 [facility]-INTER[facility] RAD  . LESION REMOVAL N/A 05/31/2015   Procedure: REMOVAL RIGHT AND LEFT LESIONS OF MANDIBLE;  Surgeon: Diona Browner, DDS;  Location: [facility];  Service: Oral Surgery;  Laterality: N/A;  . MALONEY DILATION  12/29/2010   RMR;  . NM MYOCAR PERF WALL MOTION  2006   "relavtiely normal" persantine, mild anterior thinning (breast attenuation artifact), no region of scar/ischemia  . OVARIAN CYST REMOVAL    . RECTOCELE REPAIR N/A 06/29/2015   Procedure: POSTERIOR REPAIR  (RECTOCELE);  Surgeon: Jonnie Kind, MD;  Location: [facility];  Service: Gynecology;  Laterality: N/A;  . REDUCTION MAMMAPLASTY Bilateral   . SPINE SURGERY  09/29/2010   Dr. Rolena Infante  . surgical excision of 3 tumors from right thigh and right buttock  and left upper thigh  2010  . TOOTH EXTRACTION Bilateral 12/14/2014   Procedure: REMOVAL OF BILATERAL MANDIBULAR EXOSTOSES;  Surgeon: Diona Browner, DDS;  Location: [facility];  Service: Oral Surgery;  Laterality: Bilateral;  . TRANSTHORACIC ECHOCARDIOGRAM  2010   EF 60-65%, mild conc LVH, grade 1 diastolic dysfunction; mildly calcified MV annulus with mildly thickened leaflets, mildly calcified MR annulus    Family History Family History  Problem  Relation Age of Onset  . Heart attack Mother        HTN  . Pneumonia Father   . Kidney failure Father   . Diabetes Father   . Pancreatic cancer Sister   . Diabetes Brother   . Hypertension Brother   . Diabetes Brother   . Cancer Sister        breast   . Hypertension Son   . Sleep apnea Son   . Cancer Sister        pancreatic  . Stroke Maternal Grandmother   . Heart attack Maternal Grandfather   . Alcohol abuse Maternal Uncle   . Colon cancer Neg Hx   . Anesthesia problems Neg Hx   . Hypotension Neg Hx   . Malignant hyperthermia Neg Hx   . Pseudochol deficiency Neg Hx      Social History  reports that she has been smoking cigarettes. She has been smoking about 0.50 packs per day. She has never used smokeless tobacco. She reports that she does not drink alcohol or use drugs.  Medications  Current Outpatient Medications:  .  ACCU-CHEK FASTCLIX LANCETS MISC, Use to check blood sugar 4 times a day, Disp: 306 each, Rfl: 0 .  albuterol (PROVENTIL HFA;VENTOLIN HFA) 108 (90 Base) MCG/ACT inhaler, Inhale 1-2 puffs every 6 hours as needed for wheezing, shortness of breath, Disp: 3 Inhaler, Rfl: 3 .  amLODipine (NORVASC) 5 MG tablet, Take 1 tablet (5 mg total) by mouth daily., Disp: 90 tablet, Rfl:  1 .  ascorbic acid (VITAMIN C) 500 MG tablet, Take 500 mg by mouth daily., Disp: , Rfl:  .  aspirin EC 81 MG tablet, Take 81 mg by mouth daily., Disp: , Rfl:  .  Blood Glucose Monitoring Suppl (ACCU-CHEK AVIVA) device, Use as instructed, Disp: 1 each, Rfl: 0 .  cetirizine (ZYRTEC) 10 MG tablet, Take 1 tablet (10 mg total) by mouth daily., Disp: 7 tablet, Rfl: 0 .  Cholecalciferol (VITAMIN D3) 5000 units CAPS, Take 1 capsule by mouth daily., Disp: , Rfl:  .  cloNIDine (CATAPRES) 0.2 MG tablet, One tablet at bedtime for blood pressure, Disp: 90 tablet, Rfl: 3 .  diclofenac sodium (VOLTAREN) 1 % GEL, Apply 2 g topically 4 (four) times daily., Disp: 1 Tube, Rfl: 3 .  docusate sodium (COLACE) 100 MG capsule, Take 100 mg by mouth 2 (two) times daily., Disp: , Rfl:  .  DROPLET PEN NEEDLES 32G X 4 MM MISC, USE  TO INJECT FOUR TIMES DAILY, Disp: 400 each, Rfl: 0 .  glucose blood (ACCU-CHEK AVIVA) test strip, Use to check blood sugar 4 times a day, Disp: 400 each, Rfl: 2 .  hydroxychloroquine (PLAQUENIL) 200 MG tablet, Take 200 mg by mouth daily. , Disp: , Rfl:  .  insulin aspart (NOVOLOG FLEXPEN) 100 UNIT/ML FlexPen, INJECT 10 TO 14 UNITS INTO THE SKIN 3 (THREE) TIMES DAILY WITH MEALS., Disp: 45 mL, Rfl: 3 .  Insulin Glargine (TOUJEO SOLOSTAR) 300 UNIT/ML SOPN, Inject 20 Units into the skin at bedtime., Disp: 9 pen, Rfl: 3 .  lamoTRIgine (LAMICTAL) 100 MG tablet, Take 1 tablet (100 mg total) by mouth 2 (two) times daily., Disp: 180 tablet, Rfl: 2 .  levothyroxine (SYNTHROID, LEVOTHROID) 50 MCG tablet, TAKE 1 TABLET  DAILY  EXCEPT TAKE 1/2 TABLET  ON  SUNDAY, Disp: 85 tablet, Rfl: 1 .  LORazepam (ATIVAN) 0.5 MG tablet, Take 1 tablet (0.5 mg total) by mouth 2 (two) times daily., Disp: 180 tablet,  Rfl: 2 .  losartan (COZAAR) 50 MG tablet, Take 1 tablet (50 mg total) by mouth daily., Disp: 90 tablet, Rfl: 1 .  metoprolol tartrate (LOPRESSOR) 50 MG tablet, TAKE 1 TABLET TWICE DAILY (NEED MD APPOINTMENT), Disp:  60 tablet, Rfl: 3 .  montelukast (SINGULAIR) 10 MG tablet, TAKE 1 TABLET EVERY DAY, Disp: 90 tablet, Rfl: 1 .  Multiple Vitamins-Minerals (HM MULTIVITAMIN ADULT GUMMY PO), Take 1 tablet by mouth daily., Disp: , Rfl:  .  Nicotine (NICOTROL NS) 10 MG/ML SOLN, Use 1 spray per nostril up to eight times per day as needed, Disp: 10 mL, Rfl: 0 .  nystatin (MYCOSTATIN/NYSTOP) powder, APPLY TO AFFECTED AREA 4 TIMES DAILY., Disp: 30 g, Rfl: 2 .  ondansetron (ZOFRAN) 4 MG tablet, Take 1 tablet by mouth every 6 hours as needed for nausea., Disp: 50 tablet, Rfl: 1 .  oxyCODONE-acetaminophen (PERCOCET/ROXICET) 5-325 MG tablet, Take 1 tablet by mouth every 6 (six) hours as needed. , Disp: , Rfl:  .  pregabalin (LYRICA) 75 MG capsule, Take 1 capsule (75 mg total) by mouth daily., Disp: , Rfl:  .  RABEprazole (ACIPHEX) 20 MG tablet, Take 1 tablet (20 mg total) by mouth 2 (two) times daily., Disp: 180 tablet, Rfl: 3 .  RESTASIS 0.05 % ophthalmic emulsion, Place 1 drop into both eyes 2 (two) times daily. , Disp: , Rfl:  .  risperiDONE (RISPERDAL) 0.5 MG tablet, Take 1 tablet (0.5 mg total) by mouth at bedtime., Disp: 90 tablet, Rfl: 2 .  rosuvastatin (CRESTOR) 5 MG tablet, TAKE 1 TABLET AT BEDTIME, Disp: 90 tablet, Rfl: 1 .  sertraline (ZOLOFT) 100 MG tablet, Take 1 tablet (100 mg total) by mouth daily., Disp: 90 tablet, Rfl: 2 .  traZODone (DESYREL) 150 MG tablet, Take 0.5 tablets (75 mg total) by mouth at bedtime., Disp: 135 tablet, Rfl: 2 .  triamcinolone ointment (KENALOG) 0.1 %, Apply 1 application topically at bedtime. Apply to elbows at bedtime., Disp: , Rfl:  .  UNABLE TO FIND, Transport Chair x 1  Dx-bilateral leg weakness, frequent falls, Disp: 1 each, Rfl: 0 .  UNABLE TO FIND, Rolling walker with seat x 1  DX. Bilateral lower extremity weakness, frequent falls, instability, Disp: 1 each, Rfl: 0  Allergies Cephalexin; Iron; Milk-related compounds; Penicillins; and Phenazopyridine hcl  Review of  Systems Review of Systems - Oncology ROS negative   Physical Exam  Vitals Wt Readings from Last 3 Encounters:  02/26/18 161 lb 11.2 oz (73.3 kg)  02/25/18 164 lb 12.8 oz (74.8 kg)  02/21/18 164 lb 3.2 oz (74.5 kg)   Temp Readings from Last 3 Encounters:  02/26/18 99.1 F (37.3 C) (Oral)  11/01/17 98.8 F (37.1 C) (Oral)  10/31/17 98 F (36.7 C) (Oral)   BP Readings from Last 3 Encounters:  02/26/18 (!) 161/60  02/25/18 140/68  02/21/18 137/70   Pulse Readings from Last 3 Encounters:  02/26/18 70  02/25/18 64  02/21/18 68    Constitutional: Well-developed, well-nourished, and in no distress.   HENT: Head: Normocephalic and atraumatic.  Mouth/Throat: No oropharyngeal exudate. Mucosa moist. Eyes: Pupils are equal, round, and reactive to light. Conjunctivae are normal. No scleral icterus.  Neck: Normal range of motion. Neck supple. No JVD present.  Cardiovascular: Normal rate, regular rhythm and normal heart sounds.  Exam reveals no gallop and no friction rub.   No murmur heard. Pulmonary/Chest: Effort normal and breath sounds normal. No respiratory distress. No wheezes.No rales.  Abdominal: Soft. Bowel sounds  are normal. No distension. There is no tenderness. There is no guarding.  Musculoskeletal: No edema or tenderness.  Lymphadenopathy: No cervical, axillary or supraclavicular adenopathy.  Neurological: Alert and oriented to person, place, and time. Evidence of prior stroke.   Skin: Skin is warm and dry. No rash noted. No erythema. No pallor.  Psychiatric: Affect and judgment normal.   Labs No visits with results within 3 Day(s) from this visit.  Latest known visit with results is:  Office Visit on 02/18/2018  Component Date Value Ref Range Status  . Hemoglobin A1C 02/18/2018 5.1  4.0 - 5.6 % Final     Pathology No orders of the defined types were placed in this encounter.      Zoila Shutter MD

## 2018-02-27 ENCOUNTER — Ambulatory Visit (INDEPENDENT_AMBULATORY_CARE_PROVIDER_SITE_OTHER): Payer: Medicare HMO | Admitting: Orthotics

## 2018-02-27 DIAGNOSIS — Z9181 History of falling: Secondary | ICD-10-CM

## 2018-02-27 DIAGNOSIS — E1142 Type 2 diabetes mellitus with diabetic polyneuropathy: Secondary | ICD-10-CM

## 2018-02-27 DIAGNOSIS — R260 Ataxic gait: Secondary | ICD-10-CM

## 2018-02-27 DIAGNOSIS — R29898 Other symptoms and signs involving the musculoskeletal system: Secondary | ICD-10-CM

## 2018-02-27 DIAGNOSIS — M6281 Muscle weakness (generalized): Secondary | ICD-10-CM | POA: Diagnosis not present

## 2018-02-27 DIAGNOSIS — M779 Enthesopathy, unspecified: Secondary | ICD-10-CM | POA: Diagnosis not present

## 2018-02-27 DIAGNOSIS — R2681 Unsteadiness on feet: Secondary | ICD-10-CM

## 2018-02-27 DIAGNOSIS — R6889 Other general symptoms and signs: Secondary | ICD-10-CM | POA: Diagnosis not present

## 2018-02-27 NOTE — Progress Notes (Signed)
Patient came in today to pick up Moore Balance Brace (Bilateral).   Patient was able to don brace independently and brace was check for fit to custom.   Patient was observed walking with brace and gait was improved as well as stability.   Patient was advised of care and wearing instructions.  Advised to notify practice if there were any issues; especially skin irritation.  

## 2018-03-01 DIAGNOSIS — E119 Type 2 diabetes mellitus without complications: Secondary | ICD-10-CM | POA: Diagnosis not present

## 2018-03-01 DIAGNOSIS — I69359 Hemiplegia and hemiparesis following cerebral infarction affecting unspecified side: Secondary | ICD-10-CM | POA: Diagnosis not present

## 2018-03-01 DIAGNOSIS — M6281 Muscle weakness (generalized): Secondary | ICD-10-CM | POA: Diagnosis not present

## 2018-03-01 DIAGNOSIS — Z9181 History of falling: Secondary | ICD-10-CM | POA: Diagnosis not present

## 2018-03-04 DIAGNOSIS — M4716 Other spondylosis with myelopathy, lumbar region: Secondary | ICD-10-CM | POA: Diagnosis not present

## 2018-03-04 DIAGNOSIS — F060 Psychotic disorder with hallucinations due to known physiological condition: Secondary | ICD-10-CM | POA: Diagnosis not present

## 2018-03-04 DIAGNOSIS — E119 Type 2 diabetes mellitus without complications: Secondary | ICD-10-CM | POA: Diagnosis not present

## 2018-03-04 DIAGNOSIS — Q7649 Other congenital malformations of spine, not associated with scoliosis: Secondary | ICD-10-CM | POA: Diagnosis not present

## 2018-03-04 DIAGNOSIS — M5442 Lumbago with sciatica, left side: Secondary | ICD-10-CM | POA: Diagnosis not present

## 2018-03-04 DIAGNOSIS — M542 Cervicalgia: Secondary | ICD-10-CM | POA: Diagnosis not present

## 2018-03-04 DIAGNOSIS — Z794 Long term (current) use of insulin: Secondary | ICD-10-CM | POA: Diagnosis not present

## 2018-03-04 DIAGNOSIS — M5136 Other intervertebral disc degeneration, lumbar region: Secondary | ICD-10-CM | POA: Diagnosis not present

## 2018-03-04 DIAGNOSIS — R260 Ataxic gait: Secondary | ICD-10-CM | POA: Diagnosis not present

## 2018-03-07 ENCOUNTER — Ambulatory Visit: Payer: Self-pay | Admitting: Internal Medicine

## 2018-03-07 ENCOUNTER — Telehealth: Payer: Self-pay | Admitting: Internal Medicine

## 2018-03-07 MED ORDER — GLUCOSE BLOOD VI STRP: ORAL_STRIP | 2 refills | Status: DC

## 2018-03-07 NOTE — Telephone Encounter (Signed)
MEDICATION:  Test strips for Accucheck Aviva Plus  PHARMACY:  [facility] 90 DAY SUPPLY : Yes  IS PATIENT OUT OF MEDICATION: Yes  IF NOT; HOW MUCH IS LEFT: None  LAST APPOINTMENT DATE: @12 /11/2017  NEXT APPOINTMENT DATE:@6 /02/2019  DO WE HAVE YOUR PERMISSION TO LEAVE A DETAILED MESSAGE: yes  OTHER COMMENTS: Per Patient, Patient is unable to test blood sugar because patient does not have the correct test strips for the new Meter. Please send RX for test strips for Accucheck Aviva Plus to [facility]  Patient requests that she be called once this has been done at ph# [phone]  **Let patient know to contact pharmacy at the end of the day to make sure medication is ready. **  ** Please notify patient to allow 48-72 hours to process**  **Encourage patient to contact the pharmacy for refills or they can request refills through MYCHART**Patient is unable to test blood sugar because patient does not have the correct test strips for the new Meter. Please send RX for test strips for Accucheck Aviva Plus to [facility]

## 2018-03-07 NOTE — Telephone Encounter (Signed)
RX sent to requested pharmacy.  I left message for patient and also let her know to contact her [facility] mail order because we sent the test strip order to them on 02/18/18 with 2 refills. I do not know why they never sent them to her.

## 2018-03-08 ENCOUNTER — Telehealth: Payer: Self-pay | Admitting: Internal Medicine

## 2018-03-08 DIAGNOSIS — G4733 Obstructive sleep apnea (adult) (pediatric): Secondary | ICD-10-CM

## 2018-03-08 NOTE — Telephone Encounter (Signed)
Called and spoke with patient, she wanted to know the status of the CPAP machine. Advised patient that the order was never placed and I apologized. Order has been placed. Nothing further needed.

## 2018-03-11 DIAGNOSIS — M5136 Other intervertebral disc degeneration, lumbar region: Secondary | ICD-10-CM | POA: Diagnosis not present

## 2018-03-11 DIAGNOSIS — I639 Cerebral infarction, unspecified: Secondary | ICD-10-CM | POA: Diagnosis not present

## 2018-03-11 DIAGNOSIS — Q7649 Other congenital malformations of spine, not associated with scoliosis: Secondary | ICD-10-CM | POA: Diagnosis not present

## 2018-03-11 DIAGNOSIS — M5442 Lumbago with sciatica, left side: Secondary | ICD-10-CM | POA: Diagnosis not present

## 2018-03-11 DIAGNOSIS — R260 Ataxic gait: Secondary | ICD-10-CM | POA: Diagnosis not present

## 2018-03-11 DIAGNOSIS — M4716 Other spondylosis with myelopathy, lumbar region: Secondary | ICD-10-CM | POA: Diagnosis not present

## 2018-03-11 DIAGNOSIS — M150 Primary generalized (osteo)arthritis: Secondary | ICD-10-CM | POA: Diagnosis not present

## 2018-03-11 DIAGNOSIS — F060 Psychotic disorder with hallucinations due to known physiological condition: Secondary | ICD-10-CM | POA: Diagnosis not present

## 2018-03-11 DIAGNOSIS — E119 Type 2 diabetes mellitus without complications: Secondary | ICD-10-CM | POA: Diagnosis not present

## 2018-03-11 DIAGNOSIS — Z794 Long term (current) use of insulin: Secondary | ICD-10-CM | POA: Diagnosis not present

## 2018-03-11 DIAGNOSIS — R32 Unspecified urinary incontinence: Secondary | ICD-10-CM | POA: Diagnosis not present

## 2018-03-11 DIAGNOSIS — M542 Cervicalgia: Secondary | ICD-10-CM | POA: Diagnosis not present

## 2018-03-13 DIAGNOSIS — J449 Chronic obstructive pulmonary disease, unspecified: Secondary | ICD-10-CM | POA: Diagnosis not present

## 2018-03-13 DIAGNOSIS — R0789 Other chest pain: Secondary | ICD-10-CM | POA: Diagnosis not present

## 2018-03-13 DIAGNOSIS — R0989 Other specified symptoms and signs involving the circulatory and respiratory systems: Secondary | ICD-10-CM | POA: Diagnosis not present

## 2018-03-13 DIAGNOSIS — G4733 Obstructive sleep apnea (adult) (pediatric): Secondary | ICD-10-CM | POA: Diagnosis not present

## 2018-03-13 DIAGNOSIS — I10 Essential (primary) hypertension: Secondary | ICD-10-CM | POA: Diagnosis not present

## 2018-03-13 DIAGNOSIS — E785 Hyperlipidemia, unspecified: Secondary | ICD-10-CM | POA: Diagnosis not present

## 2018-03-13 NOTE — Progress Notes (Signed)
Cardiology Office Note    Date:  03/14/2018   ID:  [name], DOB October 22, 1950, MRN [id number]  PCP:  [name], MD  Cardiologist: Kate Sable, MD    Chief Complaint  Patient presents with  . Follow-up    referred for labile BP    History of Present Illness:    [name] is a 68 y.o. female with past medical history of HTN, HLD, Type II DM, bipolar disorder, seizure disorder, tobacco use and history of CVA who presents to the office today for evaluation of elevated BP.  She was last examined by Dr. Bronson Ing in 07/2017 and reported episodic chest tightness associated with exertion with some left arm pain which would radiate into her neck. Previous ischemic evaluation included an abnormal stress test in 2006 with cardiac catheterization in 2007 showing normal coronary arteries. Given her presenting symptoms and cardiac risk factors, a Lexiscan Myoview was obtained for ischemic evaluation. She was continued on her current medication regimen for BP including Clonidine 0.3 mg 3 times daily, Losartan 50 mg daily, and Lopressor 50 mg twice daily. NST was performed on 08/02/2017 and showed normal resting and stress perfusion with no evidence of ischemia or prior infarction and EF was preserved at greater than 65%. Overall, it was a low risk study.  In talking with the patient today, she reports overall doing well from a cardiac perspective since her last office visit and denies any recent chest pain. She is not overly active at baseline due to chronic back pain and uses a walker for ambulation. Reports her breathing has been at baseline and denies any specific orthopnea, PND, or lower extremity edema.  Her medications are managed by home health as reports they make her pillboxes on a weekly basis. She does check blood pressure routinely at home and reports this has been variable from SBP in the low 100's up to the 190's. Notes an associated headache with elevated BP. Reports her  elevated readings typically occur in the morning hours. By review of her current medications, Clonidine has gradually been reduced by her PCP as she is now taking 0.2 mg at bedtime instead of her previous dose of 0.3 mg 3 times daily.  She is very frustrated by her chronic back pain and reports being followed by orthopedics and pain management. She remains on Oxycodone but reports her pain is not well controlled with this and she is scheduled to undergo repeat cortisone injections.  Past Medical History:  Diagnosis Date  . Allergy   . Anemia   . Anxiety    takes Ativan daily  . Arthritis   . Assistance needed for mobility   . Bipolar disorder ([place])    takes Risperdal nightly  . Blood transfusion   . Cancer ([place])    In her gum  . Carpal tunnel syndrome of right wrist 05/23/2011  . Cervical disc disorder with radiculopathy of cervical region 10/31/2012  . Chronic back pain   . Chronic idiopathic constipation   . Chronic neck and back pain   . Colon polyps   . COPD (chronic obstructive pulmonary disease) with chronic bronchitis ([place]) 09/16/2013   Office Spirometry 10/30/2013-submaximal effort based on appearance of loop and curve. Numbers would fit with severe restriction but her physiologic capability may be better than this. FVC 0.91/44%, and 10.74/45%, FEV1/FVC 0.81, FEF 25-75% 1.43/69%    . Depression    takes Zoloft daily  . Diabetes mellitus    Type II  .  Diverticulosis    TCS 9/08 by Dr. Delfin Edis for diarrhea . Bx for micro scopic colitis negative.   . Fibromyalgia   . Frequent falls   . GERD (gastroesophageal reflux disease)    takes Aciphex daily  . Glaucoma    eye drops daily  . Gum symptoms    infection on antibiotic  . Hemiplegia affecting non-dominant side, post-stroke 08/02/2011  . Hyperlipidemia    takes Crestor daily  . Hypertension    takes Amlodipine,Metoprolol,and Clonidine daily  . Hypothyroidism    takes Synthroid daily  . IBS (irritable bowel syndrome)     . Insomnia    takes Trazodone nightly  . 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 Metabolic encephalopathy 1/61/0960  . Migraines    chronic headaches  . Mononeuritis lower limb   . Narcolepsy   . Osteoporosis   . Pancreatitis 2006   due to Depakote with normal EUS   . Schatzki's ring    non critical / EGD with ED 8/2011with RMR  . Seizures ([place])    takes Lamictal daily.Last seizure 3 yrs ago  . Sleep apnea    on CPAP  . Stroke [facility])    left sided weakness, speech changes  . Tubular adenoma of colon     Past Surgical History:  Procedure Laterality Date  . ABDOMINAL HYSTERECTOMY  1978  . BACK SURGERY  July 2012  . BACTERIAL OVERGROWTH TEST N/A 05/05/2013   Procedure: BACTERIAL OVERGROWTH TEST;  Surgeon: Daneil Dolin, MD;  [facility];  Service: Endoscopy;  Laterality: N/A;  7:30  . BIOPSY THYROID  2009  . BRAIN SURGERY  11/2011   resection of meningioma  . BREAST REDUCTION SURGERY  1994  . CARDIAC CATHETERIZATION  05/10/2005   normal coronaries, normal LV systolic function and EF (Dr. Jackie Plum)  . CARPAL TUNNEL RELEASE Left 07/22/04   Dr. Aline Brochure  . CATARACT EXTRACTION Bilateral   . CHOLECYSTECTOMY  1984  . COLONOSCOPY N/A 09/25/2012   AVW:[id number] diverticulosis.  colonic polyp-removed : tubular adenoma  . CRANIOTOMY  11/23/2011   Procedure: CRANIOTOMY TUMOR EXCISION;  Surgeon: Hosie Spangle, MD;  Location: [place] NEURO ORS;  Service: Neurosurgery;  Laterality: N/A;  Craniotomy for tumor resection  . ESOPHAGOGASTRODUODENOSCOPY  12/29/2010   Rourk-Retained food in the esophagus and stomach, small hiatal hernia, status post Maloney dilation of the esophagus  . ESOPHAGOGASTRODUODENOSCOPY N/A 09/25/2012   YNW:[id number] atonic baggy esophagus status post Maloney dilation 82 F. Hiatal hernia  . GIVENS CAPSULE STUDY N/A 01/15/2013   NORMAL.   . IR GENERIC HISTORICAL  03/17/2016   IR RADIOLOGIST EVAL & MGMT 03/17/2016 [facility]-INTERV RAD  . LESION REMOVAL N/A 05/31/2015    Procedure: REMOVAL RIGHT AND LEFT LESIONS OF MANDIBLE;  Surgeon: Diona Browner, DDS;  Location: [place];  Service: Oral Surgery;  Laterality: N/A;  . MALONEY DILATION  12/29/2010   RMR;  . NM MYOCAR PERF WALL MOTION  2006   "relavtiely normal" persantine, mild anterior thinning (breast attenuation artifact), no region of scar/ischemia  . OVARIAN CYST REMOVAL    . RECTOCELE REPAIR N/A 06/29/2015   Procedure: POSTERIOR REPAIR (RECTOCELE);  Surgeon: Jonnie Kind, MD;  Location: [facility] ORS;  Service: Gynecology;  Laterality: N/A;  . REDUCTION MAMMAPLASTY Bilateral   . SPINE SURGERY  09/29/2010   Dr. Rolena Infante  . surgical excision of 3 tumors from right thigh and right buttock  and left upper thigh  2010  . TOOTH EXTRACTION Bilateral 12/14/2014  Procedure: REMOVAL OF BILATERAL MANDIBULAR EXOSTOSES;  Surgeon: Diona Browner, DDS;  Location: [place];  Service: Oral Surgery;  Laterality: Bilateral;  . TRANSTHORACIC ECHOCARDIOGRAM  2010   EF 60-65%, mild conc LVH, grade 1 diastolic dysfunction; mildly calcified MV annulus with mildly thickened leaflets, mildly calcified MR annulus    Current Medications: Outpatient Medications Prior to Visit  Medication Sig Dispense Refill  . ACCU-CHEK FASTCLIX LANCETS MISC Use to check blood sugar 4 times a day 306 each 0  . albuterol (PROVENTIL HFA;VENTOLIN HFA) 108 (90 Base) MCG/ACT inhaler Inhale 1-2 puffs every 6 hours as needed for wheezing, shortness of breath 3 Inhaler 3  . ascorbic acid (VITAMIN C) 500 MG tablet Take 500 mg by mouth daily.    [name] aspirin EC 81 MG tablet Take 81 mg by mouth daily.    . Blood Glucose Monitoring Suppl (ACCU-CHEK AVIVA) device Use as instructed 1 each 0  . cetirizine (ZYRTEC) 10 MG tablet Take 1 tablet (10 mg total) by mouth daily. 7 tablet 0  . Cholecalciferol (VITAMIN D3) 5000 units CAPS Take 1 capsule by mouth daily.    . diclofenac sodium (VOLTAREN) 1 % GEL Apply 2 g topically 4 (four) times daily. 1 Tube 3  . docusate sodium (COLACE)  100 MG capsule Take 100 mg by mouth 2 (two) times daily.    . DROPLET PEN NEEDLES 32G X 4 MM MISC USE  TO INJECT FOUR TIMES DAILY 400 each 0  . glucose blood (ACCU-CHEK AVIVA) test strip Use to check blood sugar 4 times a day 400 each 2  . hydroxychloroquine (PLAQUENIL) 200 MG tablet Take 200 mg by mouth daily.     . insulin aspart (NOVOLOG FLEXPEN) 100 UNIT/ML FlexPen INJECT 10 TO 14 UNITS INTO THE SKIN 3 (THREE) TIMES DAILY WITH MEALS. 45 mL 3  . Insulin Glargine (TOUJEO SOLOSTAR) 300 UNIT/ML SOPN Inject 20 Units into the skin at bedtime. 9 pen 3  . lamoTRIgine (LAMICTAL) 100 MG tablet Take 1 tablet (100 mg total) by mouth 2 (two) times daily. 180 tablet 2  . levothyroxine (SYNTHROID, LEVOTHROID) 50 MCG tablet TAKE 1 TABLET  DAILY  EXCEPT TAKE 1/2 TABLET  ON  SUNDAY 85 tablet 1  . LORazepam (ATIVAN) 0.5 MG tablet Take 1 tablet (0.5 mg total) by mouth 2 (two) times daily. 180 tablet 2  . losartan (COZAAR) 50 MG tablet Take 1 tablet (50 mg total) by mouth daily. 90 tablet 1  . metoprolol tartrate (LOPRESSOR) 50 MG tablet TAKE 1 TABLET TWICE DAILY (NEED MD APPOINTMENT) 60 tablet 3  . montelukast (SINGULAIR) 10 MG tablet TAKE 1 TABLET EVERY DAY 90 tablet 1  . Multiple Vitamins-Minerals (HM MULTIVITAMIN ADULT GUMMY PO) Take 1 tablet by mouth daily.    . Nicotine (NICOTROL NS) 10 MG/ML SOLN Use 1 spray per nostril up to eight times per day as needed 10 mL 0  . nystatin (MYCOSTATIN/NYSTOP) powder APPLY TO AFFECTED AREA 4 TIMES DAILY. 30 g 2  . ondansetron (ZOFRAN) 4 MG tablet Take 1 tablet by mouth every 6 hours as needed for nausea. 50 tablet 1  . oxyCODONE-acetaminophen (PERCOCET/ROXICET) 5-325 MG tablet Take 1 tablet by mouth every 6 (six) hours as needed.     . pregabalin (LYRICA) 75 MG capsule Take 1 capsule (75 mg total) by mouth daily.    . RABEprazole (ACIPHEX) 20 MG tablet Take 1 tablet (20 mg total) by mouth 2 (two) times daily. 180 tablet 3  . RESTASIS 0.05 %  ophthalmic emulsion Place 1 drop  into both eyes 2 (two) times daily.     . risperiDONE (RISPERDAL) 0.5 MG tablet Take 1 tablet (0.5 mg total) by mouth at bedtime. 90 tablet 2  . rosuvastatin (CRESTOR) 5 MG tablet TAKE 1 TABLET AT BEDTIME 90 tablet 1  . sertraline (ZOLOFT) 100 MG tablet Take 1 tablet (100 mg total) by mouth daily. 90 tablet 2  . traZODone (DESYREL) 150 MG tablet Take 0.5 tablets (75 mg total) by mouth at bedtime. 135 tablet 2  . triamcinolone ointment (KENALOG) 0.1 % Apply 1 application topically at bedtime. Apply to elbows at bedtime.    [name] UNABLE TO FIND Transport Chair x 1  Dx-bilateral leg weakness, frequent falls 1 each 0  . UNABLE TO FIND Rolling walker with seat x 1  DX. Bilateral lower extremity weakness, frequent falls, instability 1 each 0  . amLODipine (NORVASC) 5 MG tablet Take 1 tablet (5 mg total) by mouth daily. 90 tablet 1  . cloNIDine (CATAPRES) 0.2 MG tablet One tablet at bedtime for blood pressure 90 tablet 3   No facility-administered medications prior to visit.      Allergies:   Cephalexin; Iron; Milk-related compounds; Penicillins; and Phenazopyridine hcl   Social History   Socioeconomic History  . Marital status: Divorced    Spouse name: Not on file  . Number of children: 1  . Years of education: 64  . Highest education level: Not on file  Occupational History  . Occupation: Disabled  Social Needs  . Financial resource strain: Not very hard  . Food insecurity:    Worry: Never true    Inability: Never true  . Transportation needs:    Medical: No    Non-medical: Not on file  Tobacco Use  . Smoking status: Current Every Day Smoker    Packs/day: 0.50    Types: Cigarettes  . Smokeless tobacco: Never Used  . Tobacco comment: 5-10 cigs a day  Substance and Sexual Activity  . Alcohol use: No    Alcohol/week: 0.0 standard drinks    Comment:    . Drug use: No  . Sexual activity: Not Currently  Lifestyle  . Physical activity:    Days per week: 0 days    Minutes per  session: 0 min  . Stress: Only a little  Relationships  . Social connections:    Talks on phone: More than three times a week    Gets together: Never    Attends religious service: 1 to 4 times per year    Active member of club or organization: No    Attends meetings of clubs or organizations: Never    Relationship status: Divorced  Other Topics Concern  . Not on file  Social History Narrative   01/29/18 Lives alone, has 3 aides, Mon- Fri 8 hrs, 2 hrs on Sat-Sun, RN manages her meds   Caffeine use: Drink coffee sometimes    Right handed      Family History:  The patient's family history includes Alcohol abuse in her maternal uncle; Cancer in her sister and sister; Diabetes in her brother, brother, and father; Heart attack in her maternal grandfather and mother; Hypertension in her brother and son; Kidney failure in her father; Pancreatic cancer in her sister; Pneumonia in her father; Sleep apnea in her son; Stroke in her maternal grandmother.   Review of Systems:   Please see the history of present illness.     General:  No chills, fever,  night sweats or weight changes. Positive for chronic back pain.  Cardiovascular:  No chest pain, dyspnea on exertion, edema, orthopnea, palpitations, paroxysmal nocturnal dyspnea. Dermatological: No rash, lesions/masses Respiratory: No cough, dyspnea Urologic: No hematuria, dysuria Abdominal:   No nausea, vomiting, diarrhea, bright red blood per rectum, melena, or hematemesis Neurologic:  No visual changes, wkns, changes in mental status.  All other systems reviewed and are otherwise negative except as noted above.   Physical Exam:    VS:  BP (!) 128/58   Pulse 76   Ht 4\' 11"  (1.499 m)   Wt 165 lb (74.8 kg)   SpO2 93%   BMI 33.33 kg/m    General: Well developed, African American female appearing in no acute distress. Head: Normocephalic, atraumatic, sclera non-icteric, no xanthomas, nares are without discharge.  Neck: No carotid bruits.  JVD not elevated.  Lungs: Respirations regular and unlabored, without wheezes or rales.  Heart: Regular rate and rhythm. No S3 or S4.  No murmur, no rubs, or gallops appreciated. Abdomen: Soft, non-tender, non-distended with normoactive bowel sounds. No hepatomegaly. No rebound/guarding. No obvious abdominal masses. Msk:  Strength and tone appear normal for age. No joint deformities or effusions. Extremities: No clubbing or cyanosis. No lower extremity edema.  Distal pedal pulses are 2+ bilaterally. Neuro: Alert and oriented X 3. Moves all extremities spontaneously. No focal deficits noted. Psych:  Responds to questions appropriately with a normal affect. Skin: No rashes or lesions noted  Wt Readings from Last 3 Encounters:  03/14/18 165 lb (74.8 kg)  02/26/18 161 lb 11.2 oz (73.3 kg)  02/25/18 164 lb 12.8 oz (74.8 kg)     Studies/Labs Reviewed:   EKG:  EKG is not ordered today.   Recent Labs: 05/19/2017: B Natriuretic Peptide 55.0 10/08/2017: TSH 0.52 02/15/2018: ALT 22; BUN 21; Creatinine, Ser 0.89; Hemoglobin 10.2; Platelets 205; Potassium 3.5; Sodium 144   Lipid Panel    Component Value Date/Time   CHOL 143 02/11/2018 1310   TRIG 89 02/11/2018 1310   HDL 53 02/11/2018 1310   CHOLHDL 3.1 07/19/2015 0925   VLDL 26 07/19/2015 0925   LDLCALC 72 02/11/2018 1310    Additional studies/ records that were reviewed today include:   NST: 07/2017  The study is normal.  This is a low risk study.  The left ventricular ejection fraction is hyperdynamic (>65%).  Blood pressure demonstrated a normal response to exercise.  There was no ST segment deviation noted during stress.   Normal resting and stress perfusion. No ischemia or infarction EF 72%  Echocardiogram: 02/2015 Study Conclusions  - Left ventricle: The cavity size was normal. Wall thickness was   increased in a pattern of mild LVH. Systolic function was normal.   The estimated ejection fraction was in the range of  60% to 65%.   Indeterminate diastolic function. Wall motion was normal; there   were no regional wall motion abnormalities. - Aortic valve: Valve area (VTI): 2.06 cm^2. Valve area (Vmax):   2.39 cm^2. - Mitral valve: There was mild regurgitation. - Left atrium: The atrium was mildly dilated. - Atrial septum: No defect or patent foramen ovale was identified. - Pulmonary arteries: Systolic pressure was moderately increased.   PA peak pressure: 45 mm Hg (S). - Technically adequate study.   Assessment:    1. Labile hypertension   2. Hyperlipidemia LDL goal <70   3. Atypical chest pain   4. Obstructive sleep apnea      Plan:   In  order of problems listed above:  1. Labile HTN - she has a history of labile HTN and reports SBP is variable in the 100's to 190's when checked at home. BP is at 128/58 during today's visit. She is currently on Amlodipine 5mg  daily, Clonidine 0.2 mg daily, Losartan 50 mg daily, and Lopressor 50 mg twice daily. I agree with reducing her Clonidine dosing over the past few months as she was previously on 0.3 mg TID and this is noctorious for causing rebound hypertension. Will plan to reduce Clonidine to 0.1 mg daily for 3 days and then discontinue. Will increase Amlodipine to 10 mg daily for sustained release. She was provided with a BP log and will continue to follow this with her recent medication changes. If additional BP control is needed, would plan to further titrate Losartan.  2. HLD - followed by PCP. Recent FLP in 02/2018 showed total cholesterol of 143, HDL 53, LDL at 72. Remains on Crestor 5mg  daily.   3. Atypical Chest Pain - recent NST in 07/2017 showed normal resting and stress perfusion with no evidence of ischemia or prior infarction and was a low risk study. - she denies any recurrent chest pain since her last office visit. Continue with risk factor modification.   4. OSA - continued compliance with CPAP encouraged.    Medication  Adjustments/Labs and Tests Ordered: Current medicines are reviewed at length with the patient today.  Concerns regarding medicines are outlined above.  Medication changes, Labs and Tests ordered today are listed in the Patient Instructions below. Patient Instructions  Medication Instructions:  Your physician has recommended you make the following change in your medication:  Increase Amlodipine to 10 mg Daily  Decrease Clonidine 0.1 for three days and then stop.   If you need a refill on your cardiac medications before your next appointment, please call your pharmacy.   Lab work: NONE   If you have labs (blood work) drawn today and your tests are completely normal, you will receive your results only by: [name] MyChart Message (if you have MyChart) OR . A paper copy in the mail If you have any lab test that is abnormal or we need to change your treatment, we will call you to review the results.  Testing/Procedures: NONE   Follow-Up: At [facility], you and your health needs are our priority.  As part of our continuing mission to provide you with exceptional heart care, we have created designated Provider Care Teams.  These Care Teams include your primary Cardiologist (physician) and Advanced Practice Providers (APPs -  Physician Assistants and Nurse Practitioners) who all work together to provide you with the care you need, when you need it. You will need a follow up appointment in 3 months.  Please call our office 2 months in advance to schedule this appointment.  You may see Kate Sable, MD or one of the following Advanced Practice Providers on your designated Care Team:   Bernerd Pho, PA-C [facility]) . Ermalinda Barrios, PA-C ([place])  Any Other Special Instructions Will Be Listed Below (If Applicable). Thank you for choosing [facility]!     Signed, Erma Heritage, PA-C  03/14/2018 5:36 PM    [place] [address] [place], [place] [address] Phone: [phone]

## 2018-03-14 ENCOUNTER — Encounter: Payer: Self-pay | Admitting: Student

## 2018-03-14 ENCOUNTER — Ambulatory Visit (INDEPENDENT_AMBULATORY_CARE_PROVIDER_SITE_OTHER): Payer: Medicare HMO | Admitting: Student

## 2018-03-14 VITALS — BP 128/58 | HR 76 | Ht 59.0 in | Wt 165.0 lb

## 2018-03-14 DIAGNOSIS — G4733 Obstructive sleep apnea (adult) (pediatric): Secondary | ICD-10-CM | POA: Diagnosis not present

## 2018-03-14 DIAGNOSIS — R0789 Other chest pain: Secondary | ICD-10-CM

## 2018-03-14 DIAGNOSIS — E785 Hyperlipidemia, unspecified: Secondary | ICD-10-CM | POA: Diagnosis not present

## 2018-03-14 DIAGNOSIS — R0989 Other specified symptoms and signs involving the circulatory and respiratory systems: Secondary | ICD-10-CM

## 2018-03-14 MED ORDER — AMLODIPINE BESYLATE 10 MG PO TABS: 10.0000 mg | ORAL_TABLET | Freq: Every day | ORAL | 3 refills | Status: DC

## 2018-03-14 MED ORDER — CLONIDINE HCL 0.1 MG PO TABS: 0.1000 mg | ORAL_TABLET | Freq: Two times a day (BID) | ORAL | 0 refills | Status: DC

## 2018-03-14 NOTE — Patient Instructions (Addendum)
Medication Instructions:  Your physician has recommended you make the following change in your medication:  Increase Amlodipine to 10 mg Daily  Decrease Clonidine 0.1 for three days and then stop.   If you need a refill on your cardiac medications before your next appointment, please call your pharmacy.   Lab work: NONE   If you have labs (blood work) drawn today and your tests are completely normal, you will receive your results only by: [name] MyChart Message (if you have MyChart) OR . A paper copy in the mail If you have any lab test that is abnormal or we need to change your treatment, we will call you to review the results.  Testing/Procedures: NONE   Follow-Up: At [facility], you and your health needs are our priority.  As part of our continuing mission to provide you with exceptional heart care, we have created designated Provider Care Teams.  These Care Teams include your primary Cardiologist (physician) and Advanced Practice Providers (APPs -  Physician Assistants and Nurse Practitioners) who all work together to provide you with the care you need, when you need it. You will need a follow up appointment in 3 months.  Please call our office 2 months in advance to schedule this appointment.  You may see Kate Sable, MD or one of the following Advanced Practice Providers on your designated Care Team:   Bernerd Pho, PA-C [facility]) . Ermalinda Barrios, PA-C ([place])  Any Other Special Instructions Will Be Listed Below (If Applicable). Thank you for choosing [facility]!

## 2018-03-15 ENCOUNTER — Other Ambulatory Visit: Payer: Self-pay | Admitting: Cardiovascular Disease

## 2018-03-18 ENCOUNTER — Telehealth: Payer: Self-pay

## 2018-03-18 ENCOUNTER — Ambulatory Visit (HOSPITAL_COMMUNITY): Payer: Medicare HMO | Admitting: Psychiatry

## 2018-03-18 NOTE — Telephone Encounter (Signed)
Patient called- could barely understand her due to being very hoarse. States she has been sick since Saturday with fever, aching, sore throat, chest tightness and hoarseness. I tried to ask her more questions but couldn't understand much of what she was saying. She did say she thought she had the flu. Please advise

## 2018-03-18 NOTE — Telephone Encounter (Signed)
Ed eval , or office work in tomorrow?

## 2018-03-19 ENCOUNTER — Ambulatory Visit (HOSPITAL_COMMUNITY): Payer: Self-pay

## 2018-03-19 NOTE — Telephone Encounter (Signed)
Advised ER and states she will go if she gets bad enough. Offered appt for today but had no way so she scheduled for Wed 1/8

## 2018-03-20 ENCOUNTER — Ambulatory Visit (INDEPENDENT_AMBULATORY_CARE_PROVIDER_SITE_OTHER): Payer: Medicare HMO | Admitting: Family Medicine

## 2018-03-20 ENCOUNTER — Encounter: Payer: Self-pay | Admitting: Family Medicine

## 2018-03-20 VITALS — BP 144/58 | HR 77 | Temp 99.5°F | Resp 15 | Ht 59.0 in | Wt 164.0 lb

## 2018-03-20 DIAGNOSIS — R6889 Other general symptoms and signs: Secondary | ICD-10-CM

## 2018-03-20 DIAGNOSIS — J209 Acute bronchitis, unspecified: Secondary | ICD-10-CM | POA: Diagnosis not present

## 2018-03-20 DIAGNOSIS — Z720 Tobacco use: Secondary | ICD-10-CM | POA: Diagnosis not present

## 2018-03-20 DIAGNOSIS — F3170 Bipolar disorder, currently in remission, most recent episode unspecified: Secondary | ICD-10-CM

## 2018-03-20 DIAGNOSIS — J440 Chronic obstructive pulmonary disease with acute lower respiratory infection: Secondary | ICD-10-CM | POA: Diagnosis not present

## 2018-03-20 DIAGNOSIS — J0100 Acute maxillary sinusitis, unspecified: Secondary | ICD-10-CM

## 2018-03-20 LAB — POCT INFLUENZA A/B
Influenza A, POC: NEGATIVE
Influenza B, POC: NEGATIVE

## 2018-03-20 MED ORDER — BENZONATATE 100 MG PO CAPS: 100.0000 mg | ORAL_CAPSULE | Freq: Two times a day (BID) | ORAL | 0 refills | Status: DC | PRN

## 2018-03-20 MED ORDER — AZITHROMYCIN 250 MG PO TABS: ORAL_TABLET | ORAL | 0 refills | Status: DC

## 2018-03-20 MED ORDER — PROMETHAZINE-DM 6.25-15 MG/5ML PO SYRP: ORAL_SOLUTION | ORAL | 0 refills | Status: DC

## 2018-03-20 NOTE — Patient Instructions (Signed)
F/u as before, call if you need me sooner.  Medications are prescribed for sinusitis and bronchitis, scripts are at [facility], start today  Please check your Lung Doc re your cPAP machine   Ensure adequate fluid intake and rest

## 2018-03-21 ENCOUNTER — Telehealth: Payer: Self-pay | Admitting: *Deleted

## 2018-03-21 NOTE — Telephone Encounter (Signed)
Spoke with patient she is aware

## 2018-03-21 NOTE — Telephone Encounter (Signed)
Patient called in said she has a lot of mucous that is green. She has a lot of chest congestion. She could not get the cough syrup that was called in because it was $40.00. She started an antibiotic yesterday but she still feels really bad. Needs some relief from the cough.

## 2018-03-21 NOTE — Telephone Encounter (Signed)
Advise her to use sugar free robitussin DM please

## 2018-03-25 DIAGNOSIS — Q7649 Other congenital malformations of spine, not associated with scoliosis: Secondary | ICD-10-CM | POA: Diagnosis not present

## 2018-03-25 DIAGNOSIS — M25512 Pain in left shoulder: Secondary | ICD-10-CM | POA: Diagnosis not present

## 2018-03-25 DIAGNOSIS — M4716 Other spondylosis with myelopathy, lumbar region: Secondary | ICD-10-CM | POA: Diagnosis not present

## 2018-03-25 DIAGNOSIS — M542 Cervicalgia: Secondary | ICD-10-CM | POA: Diagnosis not present

## 2018-03-25 DIAGNOSIS — M5442 Lumbago with sciatica, left side: Secondary | ICD-10-CM | POA: Diagnosis not present

## 2018-03-25 DIAGNOSIS — M5136 Other intervertebral disc degeneration, lumbar region: Secondary | ICD-10-CM | POA: Diagnosis not present

## 2018-03-25 DIAGNOSIS — R260 Ataxic gait: Secondary | ICD-10-CM | POA: Diagnosis not present

## 2018-03-25 DIAGNOSIS — M5030 Other cervical disc degeneration, unspecified cervical region: Secondary | ICD-10-CM | POA: Diagnosis not present

## 2018-03-25 DIAGNOSIS — E119 Type 2 diabetes mellitus without complications: Secondary | ICD-10-CM | POA: Diagnosis not present

## 2018-03-25 DIAGNOSIS — F060 Psychotic disorder with hallucinations due to known physiological condition: Secondary | ICD-10-CM | POA: Diagnosis not present

## 2018-03-25 DIAGNOSIS — Z794 Long term (current) use of insulin: Secondary | ICD-10-CM | POA: Diagnosis not present

## 2018-03-26 ENCOUNTER — Encounter (HOSPITAL_COMMUNITY): Payer: Self-pay

## 2018-03-26 ENCOUNTER — Inpatient Hospital Stay (HOSPITAL_COMMUNITY): Payer: Medicare HMO | Attending: Hematology

## 2018-03-26 VITALS — BP 133/52 | HR 62 | Temp 98.7°F | Resp 18

## 2018-03-26 DIAGNOSIS — D509 Iron deficiency anemia, unspecified: Secondary | ICD-10-CM

## 2018-03-26 DIAGNOSIS — Z79899 Other long term (current) drug therapy: Secondary | ICD-10-CM | POA: Diagnosis not present

## 2018-03-26 MED ORDER — SODIUM CHLORIDE 0.9 % IV SOLN
510.0000 mg | Freq: Once | INTRAVENOUS | Status: AC
  Administered 2018-03-26: 510 mg via INTRAVENOUS
  Filled 2018-03-26: qty 17

## 2018-03-26 MED ORDER — SODIUM CHLORIDE 0.9 % IV SOLN
Freq: Once | INTRAVENOUS | Status: AC
  Administered 2018-03-26: 15:00:00 via INTRAVENOUS

## 2018-03-26 MED ORDER — SODIUM CHLORIDE 0.9% FLUSH
10.0000 mL | Freq: Once | INTRAVENOUS | Status: AC | PRN
  Administered 2018-03-26: 10 mL

## 2018-03-26 NOTE — Progress Notes (Signed)
Patient tolerated iron infusion with no complaints voiced.  Good blood return noted before and after treatment.  Band aid applied.  VSS with discharge and left ambulatory with no s/s of distress noted.

## 2018-03-26 NOTE — Patient Instructions (Signed)
[facility] at [facility]  Discharge Instructions:   _______________________________________________________________  Thank you for choosing [facility] at [facility] to provide your oncology and hematology care.  To afford each patient quality time with our providers, please arrive at least 15 minutes before your scheduled appointment.  You need to re-schedule your appointment if you arrive 10 or more minutes late.  We strive to give you quality time with our providers, and arriving late affects you and other patients whose appointments are after yours.  Also, if you no show three or more times for appointments you may be dismissed from the clinic.  Again, thank you for choosing [facility] at [facility]. Our hope is that these requests will allow you access to exceptional care and in a timely manner. _______________________________________________________________  If you have questions after your visit, please contact our office at [phone] between the hours of 8:30 a.m. and 5:00 p.m. Voicemails left after 4:30 p.m. will not be returned until the following business day. _______________________________________________________________  For prescription refill requests, have your pharmacy contact our office. _______________________________________________________________  Recommendations made by the consultant and any test results will be sent to your referring physician. _______________________________________________________________ 

## 2018-03-27 ENCOUNTER — Telehealth: Payer: Self-pay

## 2018-03-27 ENCOUNTER — Other Ambulatory Visit: Payer: Self-pay | Admitting: Family Medicine

## 2018-03-27 ENCOUNTER — Telehealth: Payer: Self-pay | Admitting: Internal Medicine

## 2018-03-27 MED ORDER — PREDNISONE 5 MG PO TABS: ORAL_TABLET | ORAL | 0 refills | Status: DC

## 2018-03-27 MED ORDER — HYDROXYZINE HCL 10 MG PO TABS: 10.0000 mg | ORAL_TABLET | Freq: Three times a day (TID) | ORAL | 0 refills | Status: DC | PRN

## 2018-03-27 NOTE — Telephone Encounter (Signed)
Called [facility] and unable to reach as they are closed. Will call in the morning, called patient unable to reach left message to give [name] a call back.

## 2018-03-27 NOTE — Telephone Encounter (Signed)
I recommend she discuss her reaction with the Dr administering the medication, they may give her medication to take to prevent / reduce the reaction, OR they may not give her the infusion. Needs to discuss with treating mD, pls let her know

## 2018-03-27 NOTE — Telephone Encounter (Signed)
Patient had iron infusion at the cancer center yesterday and now is itching all over. Itching in her scalp and all over and was unable to get any rest last night. She called the nurse on call last night and was told she was having a reaction and to get in touch with her pcp for instructions. No new foods. Please advise.

## 2018-03-27 NOTE — Telephone Encounter (Signed)
I am sending in hydroxyzined for the itching and pallergic reaction and 3 days of prednisone

## 2018-03-27 NOTE — Telephone Encounter (Signed)
Patient notified of treatment plan. She stated that she is scheduled to have another infusion next week and wants to know if she should continue with scheduled infusion.

## 2018-03-27 NOTE — Telephone Encounter (Signed)
Spoke with patient and let her know what Dr.[name] recommended with verbal understanding.

## 2018-03-28 NOTE — Telephone Encounter (Signed)
Called [name] and spoke to [name].  She states she spoke to pt on 1/8 and told her she is not eligible for new machine until June.  They offered her an appt to bring her machine in for them to look at it and she didn't want to bring it in.  They offered her a loaner for 50.00 and she didn't want to do that.  [name] is going to call her back now and go over all of that with her again.  Nothing further needed from [name] at this time.

## 2018-03-28 NOTE — Telephone Encounter (Signed)
Order placed 03/08/18  Forwarding to PCP per protocol

## 2018-03-29 ENCOUNTER — Ambulatory Visit (INDEPENDENT_AMBULATORY_CARE_PROVIDER_SITE_OTHER): Payer: Medicare HMO | Admitting: Podiatry

## 2018-03-29 ENCOUNTER — Encounter: Payer: Self-pay | Admitting: Podiatry

## 2018-03-29 DIAGNOSIS — E1142 Type 2 diabetes mellitus with diabetic polyneuropathy: Secondary | ICD-10-CM

## 2018-03-29 DIAGNOSIS — B351 Tinea unguium: Secondary | ICD-10-CM | POA: Diagnosis not present

## 2018-03-29 DIAGNOSIS — M79676 Pain in unspecified toe(s): Secondary | ICD-10-CM | POA: Diagnosis not present

## 2018-03-31 NOTE — Progress Notes (Signed)
Subjective:  Patient ID: [name], female    DOB: 1950-04-24,  MRN: [id number]  Chief Complaint  Patient presents with  . Debridement    Trim toenails    68 y.o. female presents  for diabetic foot care. Last AMBS was unknown. Reports numbness and tingling in their feet. Denies cramping in legs and thighs.  Review of Systems: Negative except as noted in the HPI. Denies N/V/F/Ch.  Past Medical History:  Diagnosis Date  . Allergy   . Anemia   . Anxiety    takes Ativan daily  . Arthritis   . Assistance needed for mobility   . Bipolar disorder ([place])    takes Risperdal nightly  . Blood transfusion   . Cancer ([place])    In her gum  . Carpal tunnel syndrome of right wrist 05/23/2011  . Cervical disc disorder with radiculopathy of cervical region 10/31/2012  . Chronic back pain   . Chronic idiopathic constipation   . Chronic neck and back pain   . Colon polyps   . COPD (chronic obstructive pulmonary disease) with chronic bronchitis ([facility]) 09/16/2013   Office Spirometry 10/30/2013-submaximal effort based on appearance of loop and curve. Numbers would fit with severe restriction but her physiologic capability may be better than this. FVC 0.91/44%, and 10.74/45%, FEV1/FVC 0.81, FEF 25-75% 1.43/69%    . Depression    takes Zoloft daily  . Diabetes mellitus    Type II  . Diverticulosis    TCS 9/08 by Dr. Delfin Edis for diarrhea . Bx for micro scopic colitis negative.   . Fibromyalgia   . Frequent falls   . GERD (gastroesophageal reflux disease)    takes Aciphex daily  . Glaucoma    eye drops daily  . Gum symptoms    infection on antibiotic  . Hemiplegia affecting non-dominant side, post-stroke 08/02/2011  . Hyperlipidemia    takes Crestor daily  . Hypertension    takes Amlodipine,Metoprolol,and Clonidine daily  . Hypothyroidism    takes Synthroid daily  . IBS (irritable bowel syndrome)   . Insomnia    takes Trazodone nightly  . 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  Metabolic encephalopathy 6/38/7564  . Migraines    chronic headaches  . Mononeuritis lower limb   . Narcolepsy   . Osteoporosis   . Pancreatitis 2006   due to Depakote with normal EUS   . Schatzki's ring    non critical / EGD with ED 8/2011with RMR  . Seizures ([place])    takes Lamictal daily.Last seizure 3 yrs ago  . Sleep apnea    on CPAP  . Stroke [facility])    left sided weakness, speech changes  . Tubular adenoma of colon     Current Outpatient Medications:  .  ACCU-CHEK FASTCLIX LANCETS MISC, Use to check blood sugar 4 times a day, Disp: 306 each, Rfl: 0 .  albuterol (PROVENTIL HFA;VENTOLIN HFA) 108 (90 Base) MCG/ACT inhaler, Inhale 1-2 puffs every 6 hours as needed for wheezing, shortness of breath, Disp: 3 Inhaler, Rfl: 3 .  amLODipine (NORVASC) 10 MG tablet, Take 1 tablet (10 mg total) by mouth daily., Disp: 90 tablet, Rfl: 3 .  ascorbic acid (VITAMIN C) 500 MG tablet, Take 500 mg by mouth daily., Disp: , Rfl:  .  aspirin EC 81 MG tablet, Take 81 mg by mouth daily., Disp: , Rfl:  .  azithromycin (ZITHROMAX) 250 MG tablet, Take two tablets I on day one, then take one tablet once daily for an  additional four days, Disp: 6 tablet, Rfl: 0 .  benzonatate (TESSALON) 100 MG capsule, Take 1 capsule (100 mg total) by mouth 2 (two) times daily as needed for cough., Disp: 14 capsule, Rfl: 0 .  Blood Glucose Monitoring Suppl (ACCU-CHEK AVIVA) device, Use as instructed, Disp: 1 each, Rfl: 0 .  cetirizine (ZYRTEC) 10 MG tablet, Take 1 tablet (10 mg total) by mouth daily., Disp: 7 tablet, Rfl: 0 .  Cholecalciferol (VITAMIN D3) 5000 units CAPS, Take 1 capsule by mouth daily., Disp: , Rfl:  .  cloNIDine (CATAPRES) 0.1 MG tablet, Take 1 tablet (0.1 mg total) by mouth 2 (two) times daily. Take 0.1 mg for 3 days and then stop, Disp: 3 tablet, Rfl: 0 .  diclofenac sodium (VOLTAREN) 1 % GEL, Apply 2 g topically 4 (four) times daily., Disp: 1 Tube, Rfl: 3 .  docusate sodium (COLACE) 100 MG capsule, Take  100 mg by mouth 2 (two) times daily., Disp: , Rfl:  .  DROPLET PEN NEEDLES 32G X 4 MM MISC, USE  TO INJECT FOUR TIMES DAILY, Disp: 400 each, Rfl: 0 .  glucose blood (ACCU-CHEK AVIVA) test strip, Use to check blood sugar 4 times a day, Disp: 400 each, Rfl: 2 .  hydroxychloroquine (PLAQUENIL) 200 MG tablet, Take 200 mg by mouth daily. , Disp: , Rfl:  .  hydrOXYzine (ATARAX/VISTARIL) 10 MG tablet, Take 1 tablet (10 mg total) by mouth 3 (three) times daily as needed., Disp: 30 tablet, Rfl: 0 .  insulin aspart (NOVOLOG FLEXPEN) 100 UNIT/ML FlexPen, INJECT 10 TO 14 UNITS INTO THE SKIN 3 (THREE) TIMES DAILY WITH MEALS., Disp: 45 mL, Rfl: 3 .  Insulin Glargine (TOUJEO SOLOSTAR) 300 UNIT/ML SOPN, Inject 20 Units into the skin at bedtime., Disp: 9 pen, Rfl: 3 .  lamoTRIgine (LAMICTAL) 100 MG tablet, Take 1 tablet (100 mg total) by mouth 2 (two) times daily., Disp: 180 tablet, Rfl: 2 .  levothyroxine (SYNTHROID, LEVOTHROID) 50 MCG tablet, TAKE 1 TABLET  DAILY  EXCEPT TAKE 1/2 TABLET  ON  SUNDAY, Disp: 85 tablet, Rfl: 1 .  LORazepam (ATIVAN) 0.5 MG tablet, Take 1 tablet (0.5 mg total) by mouth 2 (two) times daily., Disp: 180 tablet, Rfl: 2 .  losartan (COZAAR) 50 MG tablet, Take 1 tablet (50 mg total) by mouth daily., Disp: 90 tablet, Rfl: 1 .  metoprolol tartrate (LOPRESSOR) 50 MG tablet, TAKE 1 TABLET TWICE DAILY (NEED MD APPOINTMENT), Disp: 180 tablet, Rfl: 2 .  montelukast (SINGULAIR) 10 MG tablet, TAKE 1 TABLET EVERY DAY, Disp: 90 tablet, Rfl: 1 .  Multiple Vitamins-Minerals (HM MULTIVITAMIN ADULT GUMMY PO), Take 1 tablet by mouth daily., Disp: , Rfl:  .  Nicotine (NICOTROL NS) 10 MG/ML SOLN, Use 1 spray per nostril up to eight times per day as needed, Disp: 10 mL, Rfl: 0 .  nystatin (MYCOSTATIN/NYSTOP) powder, APPLY TO AFFECTED AREA 4 TIMES DAILY., Disp: 30 g, Rfl: 2 .  ondansetron (ZOFRAN) 4 MG tablet, Take 1 tablet by mouth every 6 hours as needed for nausea., Disp: 50 tablet, Rfl: 1 .   oxyCODONE-acetaminophen (PERCOCET/ROXICET) 5-325 MG tablet, Take 1 tablet by mouth every 6 (six) hours as needed. , Disp: , Rfl:  .  predniSONE (DELTASONE) 5 MG tablet, Take one tablet two times daily for 3 days, Disp: 6 tablet, Rfl: 0 .  pregabalin (LYRICA) 75 MG capsule, Take 1 capsule (75 mg total) by mouth daily., Disp: , Rfl:  .  promethazine-dextromethorphan (PROMETHAZINE-DM) 6.25-15 MG/5ML syrup, Take one  teaspoon at bedtime for excessive cough, as needed, Disp: 118 mL, Rfl: 0 .  RABEprazole (ACIPHEX) 20 MG tablet, Take 1 tablet (20 mg total) by mouth 2 (two) times daily., Disp: 180 tablet, Rfl: 3 .  RESTASIS 0.05 % ophthalmic emulsion, Place 1 drop into both eyes 2 (two) times daily. , Disp: , Rfl:  .  risperiDONE (RISPERDAL) 0.5 MG tablet, Take 1 tablet (0.5 mg total) by mouth at bedtime., Disp: 90 tablet, Rfl: 2 .  rosuvastatin (CRESTOR) 5 MG tablet, TAKE 1 TABLET AT BEDTIME, Disp: 90 tablet, Rfl: 1 .  sertraline (ZOLOFT) 100 MG tablet, Take 1 tablet (100 mg total) by mouth daily., Disp: 90 tablet, Rfl: 2 .  traZODone (DESYREL) 150 MG tablet, Take 0.5 tablets (75 mg total) by mouth at bedtime., Disp: 135 tablet, Rfl: 2 .  triamcinolone ointment (KENALOG) 0.1 %, Apply 1 application topically at bedtime. Apply to elbows at bedtime., Disp: , Rfl:  .  UNABLE TO FIND, Transport Chair x 1  Dx-bilateral leg weakness, frequent falls, Disp: 1 each, Rfl: 0 .  UNABLE TO FIND, Rolling walker with seat x 1  DX. Bilateral lower extremity weakness, frequent falls, instability, Disp: 1 each, Rfl: 0  Social History   Tobacco Use  Smoking Status Current Every Day Smoker  . Packs/day: 0.50  . Types: Cigarettes  Smokeless Tobacco Never Used  Tobacco Comment   5-10 cigs a day    Allergies  Allergen Reactions  . Cephalexin Hives  . Iron Nausea And Vomiting  . Milk-Related Compounds Other (See Comments)    Doesn't agree with stomach.   . Penicillins Hives    Has patient had a PCN reaction causing  immediate rash, facial/tongue/throat swelling, SOB or lightheadedness with hypotension: Yes Has patient had a PCN reaction causing severe rash involving mucus membranes or skin necrosis: No Has patient had a PCN reaction that required hospitalization No Has patient had a PCN reaction occurring within the last 10 years: No If all of the above answers are "NO", then may proceed with Cephalosporin use.   [name] Phenazopyridine Hcl Hives         Objective:   There were no vitals filed for this visit. There is no height or weight on file to calculate BMI. Constitutional Well developed. Well nourished.  Vascular Dorsalis pedis pulses present 1+ bilaterally  Posterior tibial pulses present 1+ bilaterally  Pedal hair growth normal. Capillary refill normal to all digits.  No cyanosis or clubbing noted.  Neurologic Normal speech. Oriented to person, place, and time. Epicritic sensation to light touch grossly present bilaterally. Protective sensation with 5.07 monofilament  absent bilaterally.  Dermatologic Nails elongated, thickened, dystrophic. No open wounds. No skin lesions.  Orthopedic: Normal joint ROM without pain or crepitus bilaterally. No visible deformities. No bony tenderness.    Assessment:   1. Diabetic polyneuropathy associated with type 2 diabetes mellitus ([place])   2. Pain due to onychomycosis of toenail    Plan:  Patient was evaluated and treated and all questions answered.  Diabetes with DPN, Onychomycosis -Educated on diabetic footcare. Diabetic risk level 1 -Nails x10 debrided sharply and manually with large nail nipper and rotary burr.   Procedure: Nail Debridement Rationale: Patient meets criteria for routine foot care due to DPN Type of Debridement: manual, sharp debridement. Instrumentation: Nail nipper, rotary burr. Number of Nails: 10   Return in about 3 months (around 06/28/2018) for Routine Foot Care.

## 2018-04-01 ENCOUNTER — Other Ambulatory Visit: Payer: Self-pay | Admitting: Internal Medicine

## 2018-04-01 DIAGNOSIS — M6281 Muscle weakness (generalized): Secondary | ICD-10-CM | POA: Diagnosis not present

## 2018-04-01 DIAGNOSIS — I69359 Hemiplegia and hemiparesis following cerebral infarction affecting unspecified side: Secondary | ICD-10-CM | POA: Diagnosis not present

## 2018-04-01 DIAGNOSIS — Z9181 History of falling: Secondary | ICD-10-CM | POA: Diagnosis not present

## 2018-04-01 DIAGNOSIS — E119 Type 2 diabetes mellitus without complications: Secondary | ICD-10-CM | POA: Diagnosis not present

## 2018-04-02 ENCOUNTER — Inpatient Hospital Stay (HOSPITAL_COMMUNITY): Payer: Medicare HMO

## 2018-04-02 ENCOUNTER — Encounter (HOSPITAL_COMMUNITY): Payer: Self-pay

## 2018-04-02 VITALS — BP 135/55 | HR 67 | Temp 98.3°F | Resp 16

## 2018-04-02 DIAGNOSIS — D509 Iron deficiency anemia, unspecified: Secondary | ICD-10-CM

## 2018-04-02 DIAGNOSIS — Z79899 Other long term (current) drug therapy: Secondary | ICD-10-CM | POA: Diagnosis not present

## 2018-04-02 DIAGNOSIS — D500 Iron deficiency anemia secondary to blood loss (chronic): Secondary | ICD-10-CM

## 2018-04-02 DIAGNOSIS — R110 Nausea: Secondary | ICD-10-CM

## 2018-04-02 MED ORDER — SODIUM CHLORIDE 0.9 % IV SOLN
510.0000 mg | Freq: Once | INTRAVENOUS | Status: AC
  Administered 2018-04-02: 510 mg via INTRAVENOUS
  Filled 2018-04-02: qty 17

## 2018-04-02 MED ORDER — SODIUM CHLORIDE 0.9 % IV SOLN: 8.0000 mg | Freq: Once | INTRAVENOUS | Status: DC

## 2018-04-02 MED ORDER — DIPHENHYDRAMINE HCL 50 MG/ML IJ SOLN
25.0000 mg | Freq: Once | INTRAMUSCULAR | Status: AC
  Administered 2018-04-02: 25 mg via INTRAVENOUS

## 2018-04-02 MED ORDER — DIPHENHYDRAMINE HCL 50 MG/ML IJ SOLN
INTRAMUSCULAR | Status: AC
  Filled 2018-04-02: qty 1

## 2018-04-02 MED ORDER — ONDANSETRON HCL 4 MG/2ML IJ SOLN
8.0000 mg | Freq: Once | INTRAMUSCULAR | Status: AC
  Administered 2018-04-02: 8 mg via INTRAVENOUS
  Filled 2018-04-02: qty 4

## 2018-04-02 MED ORDER — SODIUM CHLORIDE 0.9 % IV SOLN
Freq: Once | INTRAVENOUS | Status: AC
  Administered 2018-04-02: 15:00:00 via INTRAVENOUS

## 2018-04-02 NOTE — Patient Instructions (Signed)
[facility] at [facility] Discharge Instructions  Received Feraheme infusion today. Follow-up as scheduled. Call clinic for any questions or concerns   Thank you for choosing [facility] at [facility] to provide your oncology and hematology care.  To afford each patient quality time with our provider, please arrive at least 15 minutes before your scheduled appointment time.   If you have a lab appointment with the Cancer Center please come in thru the  Main Entrance and check in at the main information desk  You need to re-schedule your appointment should you arrive 10 or more minutes late.  We strive to give you quality time with our providers, and arriving late affects you and other patients whose appointments are after yours.  Also, if you no show three or more times for appointments you may be dismissed from the clinic at the providers discretion.     Again, thank you for choosing [facility].  Our hope is that these requests will decrease the amount of time that you wait before being seen by our physicians.       _____________________________________________________________  Should you have questions after your visit to [facility], please contact our office at [phone] between the hours of 8:00 a.m. and 4:30 p.m.  Voicemails left after 4:00 p.m. will not be returned until the following business day.  For prescription refill requests, have your pharmacy contact our office and allow 72 hours.    Cancer Center Support Programs:   > Cancer Support Group  2nd Tuesday of the month 1pm-2pm, Journey Room   

## 2018-04-02 NOTE — Progress Notes (Signed)
Pt experienced some itching and nausea at home after receiving Feraheme infusion last week so Dr. Delton Coombes was made aware and orders were obtained for Benadryl and Zofran IV prior to today's infusion per MD                       [name] tolerated Feraheme infusion well without complaints or incident. VSS upon discharge. Pt discharged self ambulatory using walker in satisfactory condition

## 2018-04-05 IMAGING — CR DG CHEST 1V PORT
1 series · 2 of 2 positions shown · non-contrast
Comparison: Earlier same day

CLINICAL DATA: Nasogastric placement.

EXAM:
PORTABLE CHEST 1 VIEW

[Series 1: portable · 0.17mm/px · 2 of 2 slices shown]
[im 1/2]
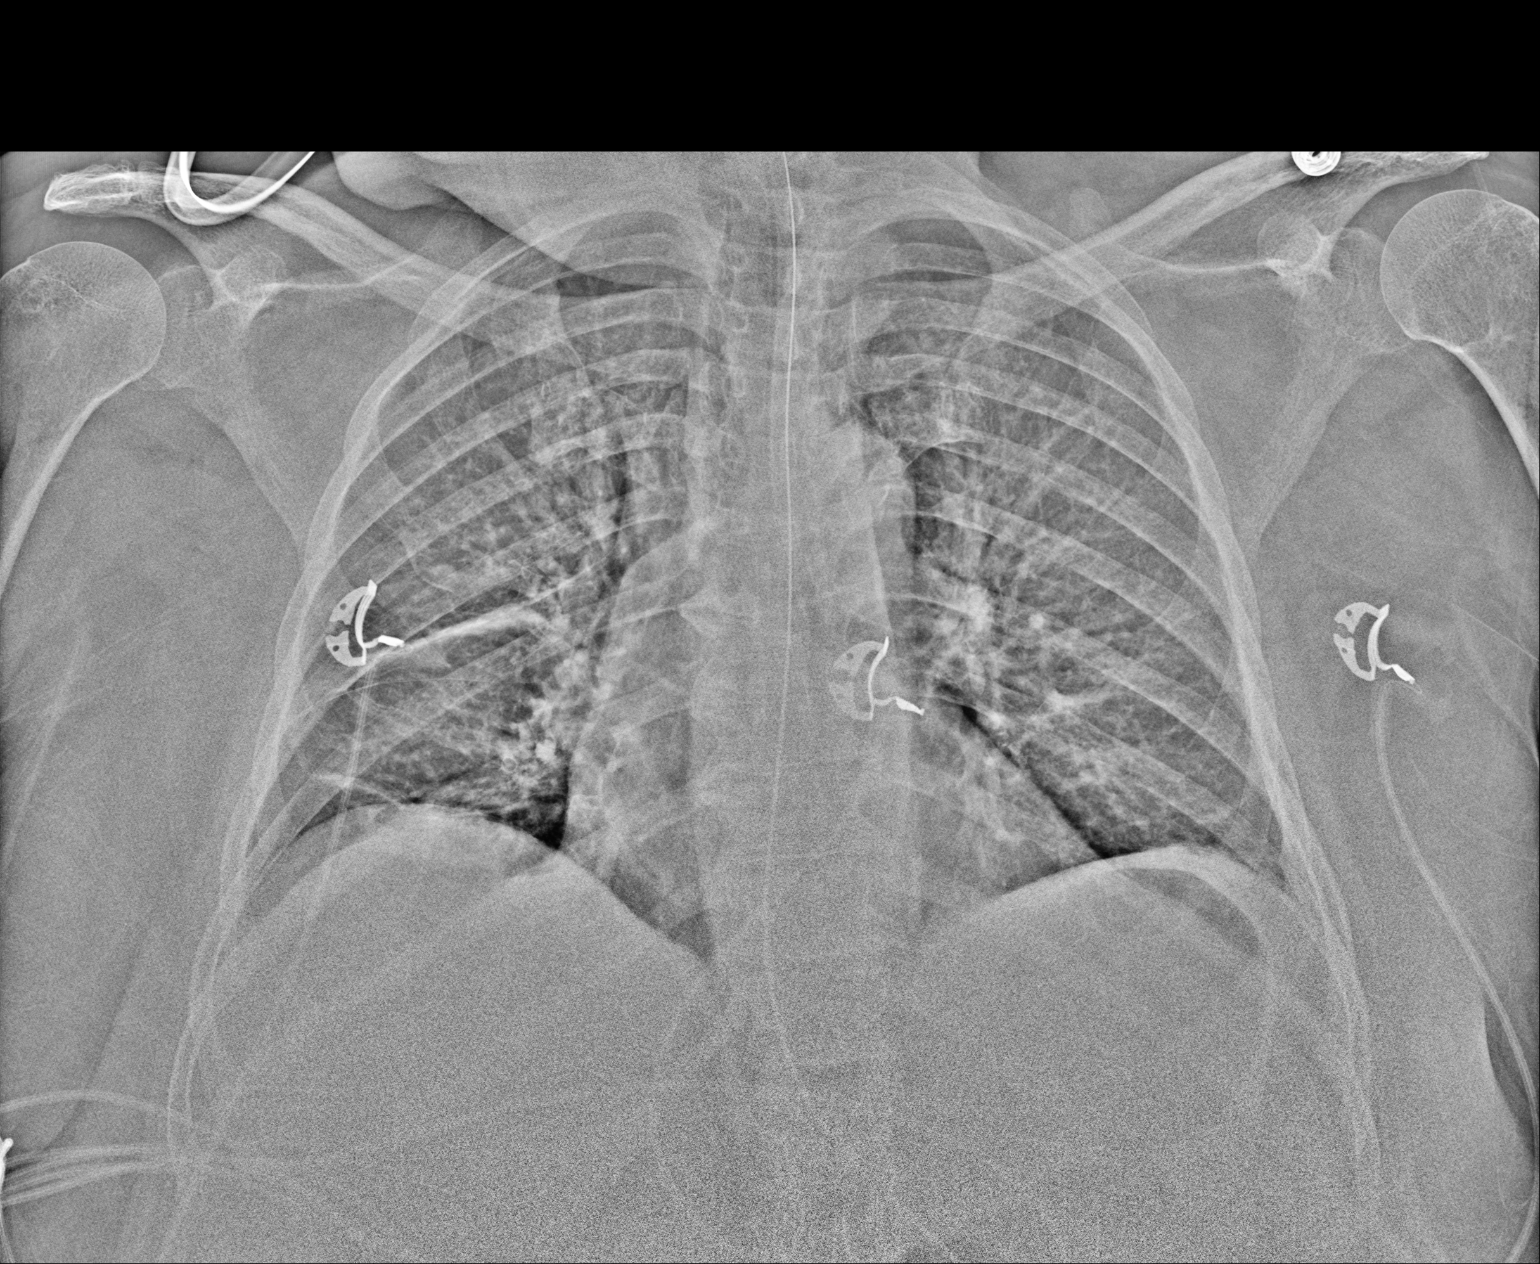
[im 2/2]
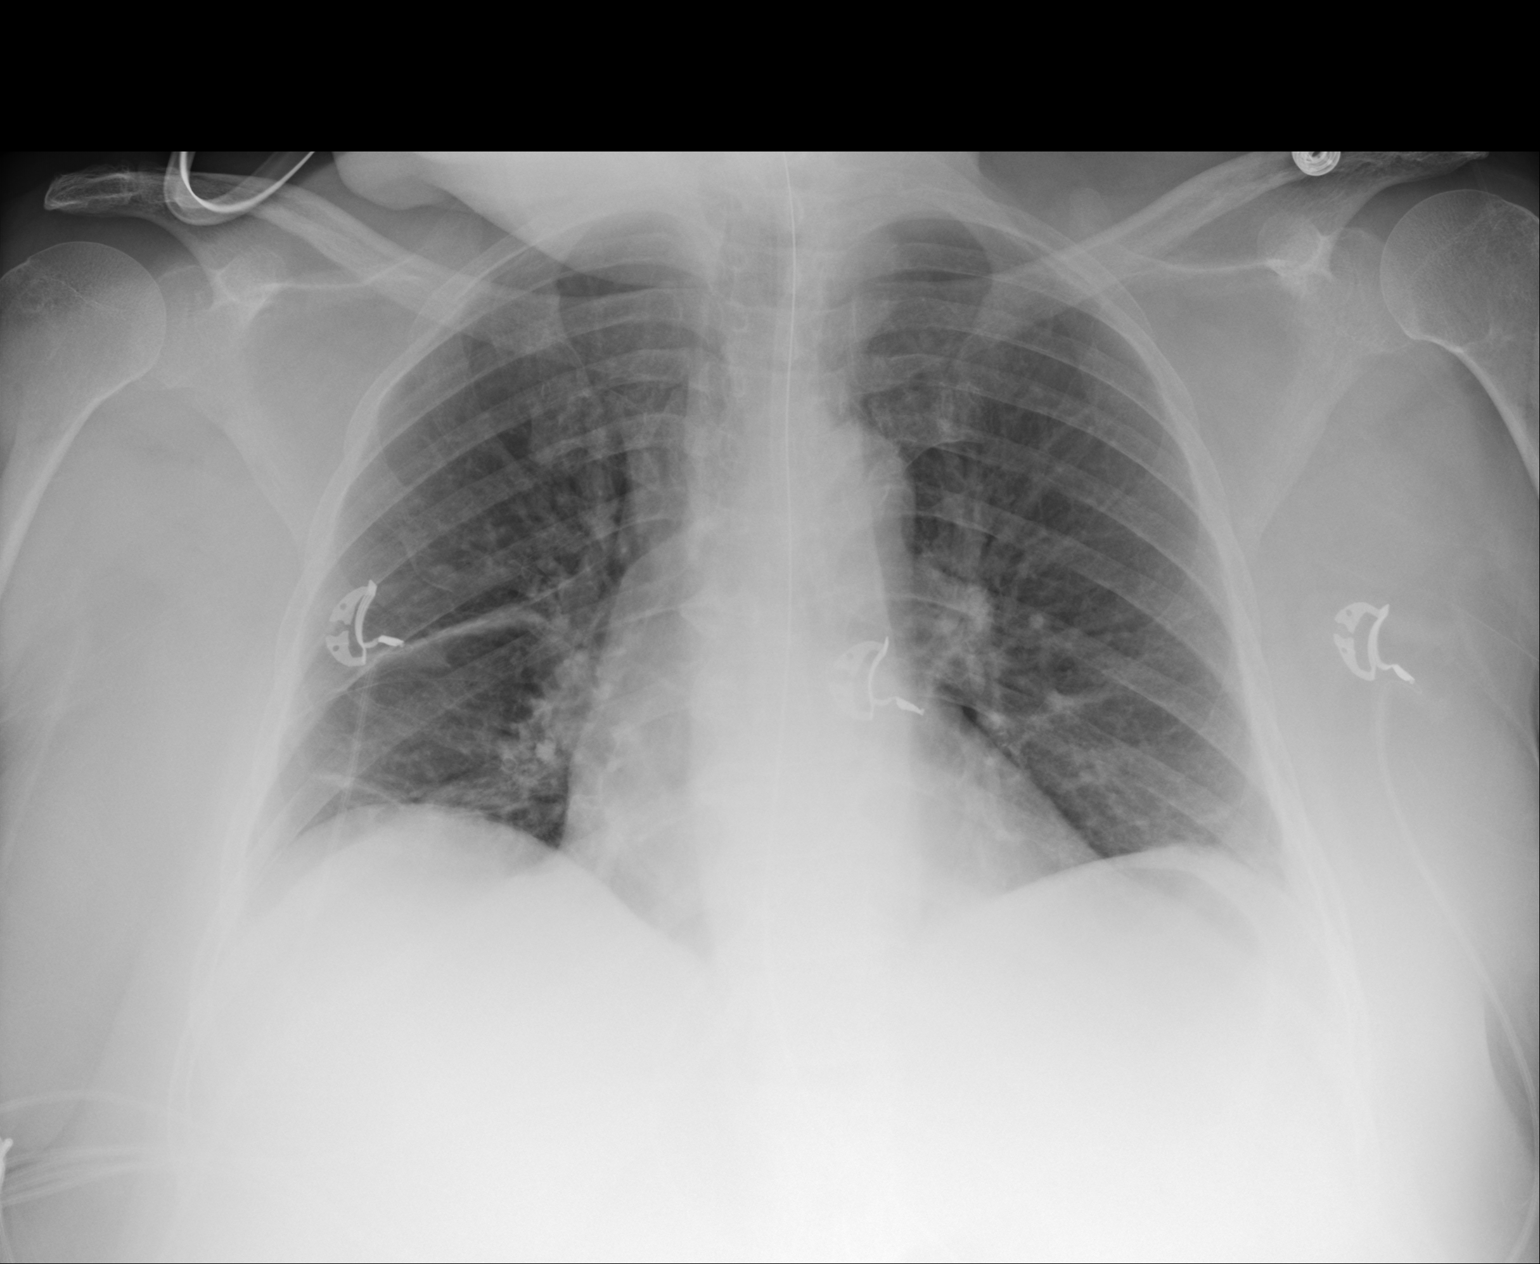

[2 of 2 positions shown; findings below may reference images not displayed]

FINDINGS: Nasogastric tube enters the stomach. Tip is not visible but this
extends at least to the mid body. Slightly more prominent
atelectasis at the right lung base.
IMPRESSION: Nasogastric tube enters the stomach with the tip at least at the mid
body of the stomach.

Slight worsening of right lower lobe atelectasis.

## 2018-04-05 IMAGING — DX DG ABDOMEN ACUTE W/ 1V CHEST
3 series · 3 of 3 positions shown · non-contrast
Comparison: 05/14/2016 CT abdomen and pelvis

CLINICAL DATA: 66 y/o  F; abdominal pain with nausea and vomiting.

EXAM:
DG ABDOMEN ACUTE W/ 1V CHEST

[chest pa]
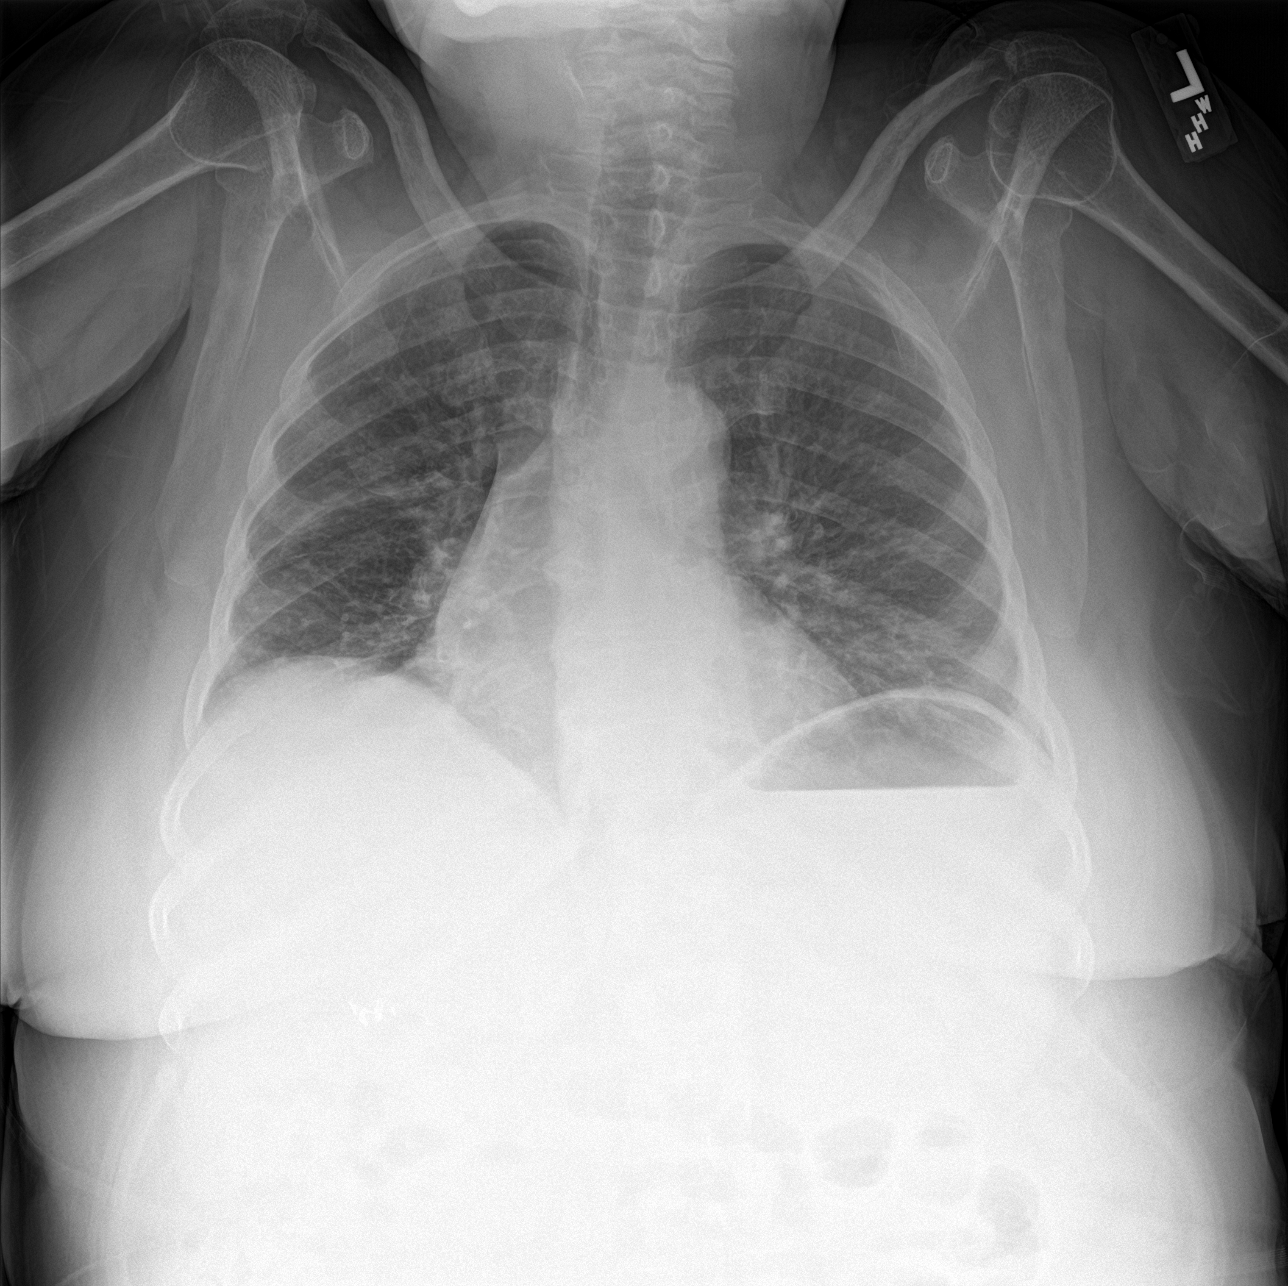

[abdomen erect]
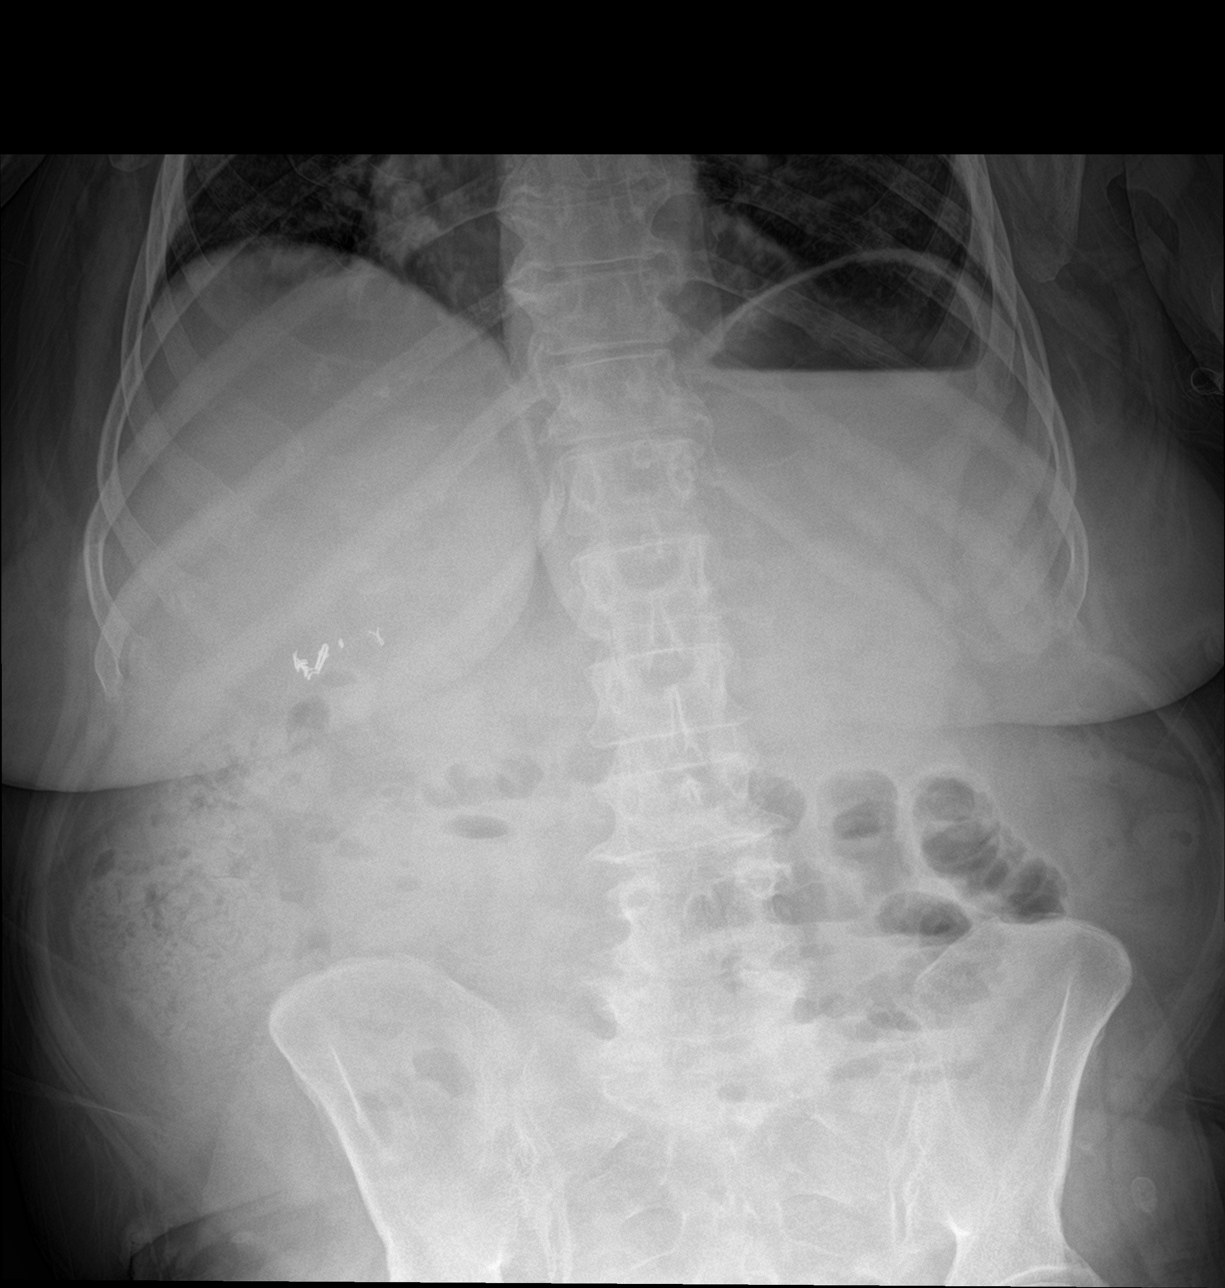

[abdomen supine]
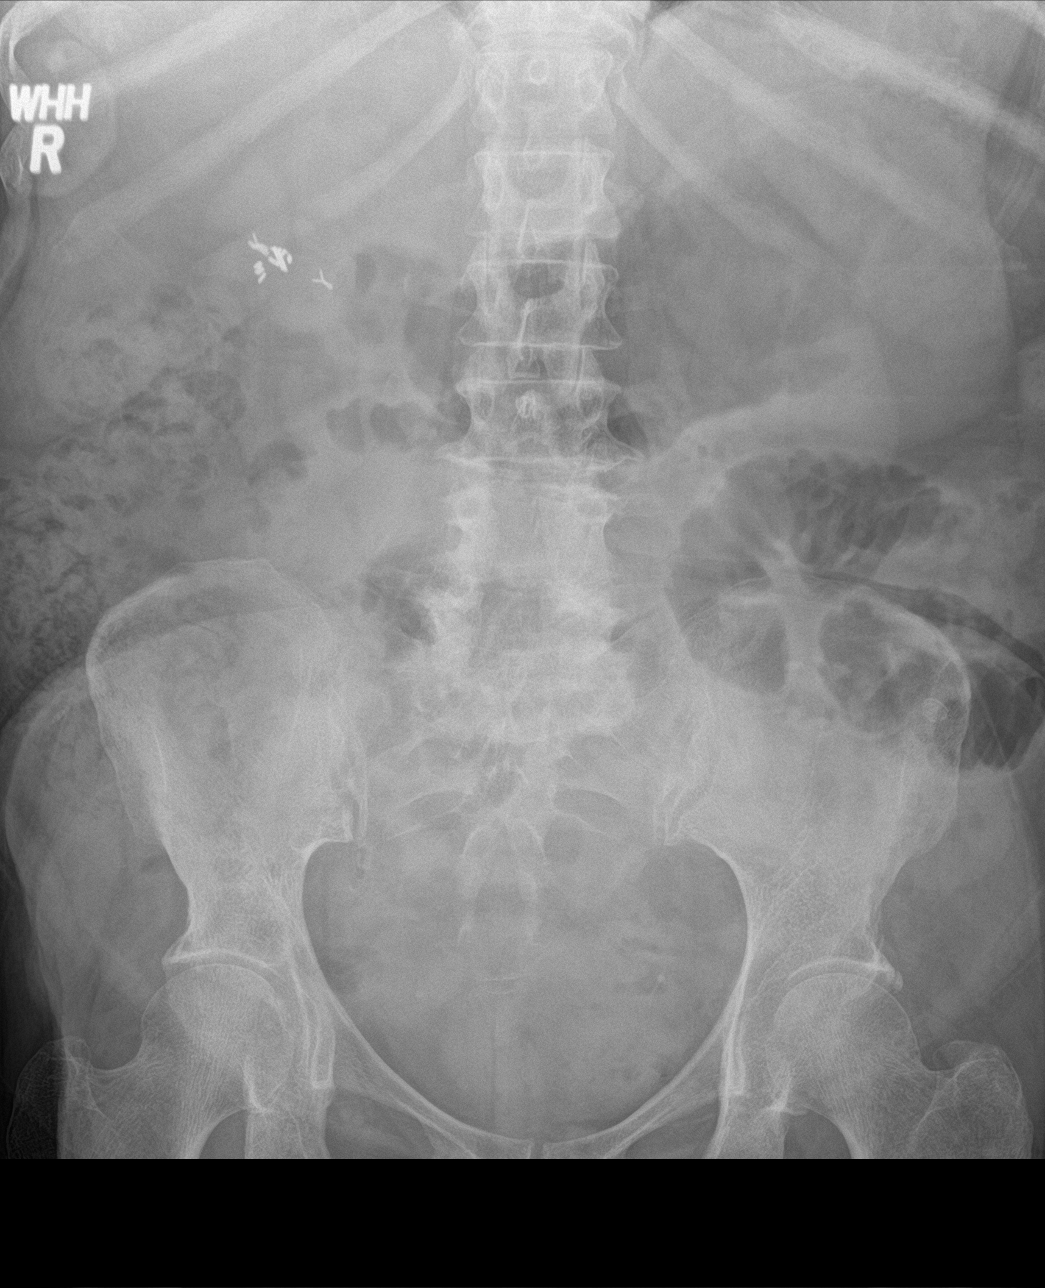

[3 of 3 positions shown; findings below may reference images not displayed]

FINDINGS: Stable normal cardiac silhouette. Aortic atherosclerosis with
calcification. A focal low lung volumes accentuate pulmonary
markings. Minor bibasilar atelectasis. No pleural effusion or
pneumothorax. Right upper quadrant surgical clips, presumably
cholecystectomy. Mildly dilated loops of small bowel in the left
hemiabdomen.
IMPRESSION: 1. Low lung volumes and minor bibasilar atelectasis.
2. Mildly dilated loops of small bowel in left hemiabdomen may
represent obstruction or ileus.

By: Idse Slee M.D.

## 2018-04-05 IMAGING — CT CT ABD-PELV W/ CM
2 of 4 series · 16 of 46 positions shown, 18 images · IV contrast (Isovue)
Comparison: 05/14/2016

CLINICAL DATA: History of oral carcinoma. Abdominal pain with
nausea and vomiting beginning last night.

EXAM:
CT ABDOMEN AND PELVIS WITH CONTRAST
TECHNIQUE: Multidetector CT imaging of the abdomen and pelvis was performed
using the standard protocol following bolus administration of
intravenous contrast.
CONTRAST:  100mL [id number] IOPAMIDOL ([id number]) INJECTION 61%

[Series 2: axial st · axial · 0.79mm/px · z∈[-541,-161]mm · 13 of 85 slices shown, 15 images]
[im 5/85  soft-tissue]
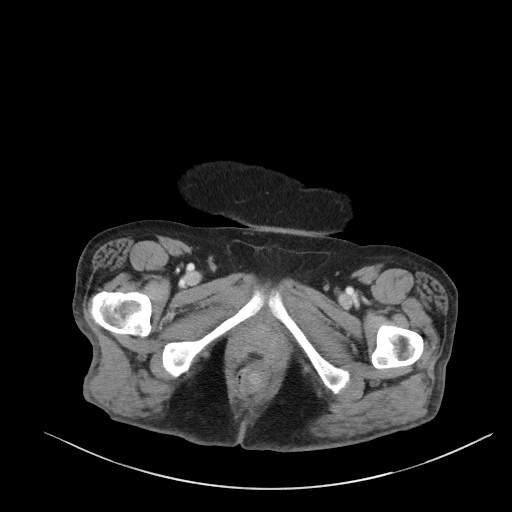
[im 5/85  bone]
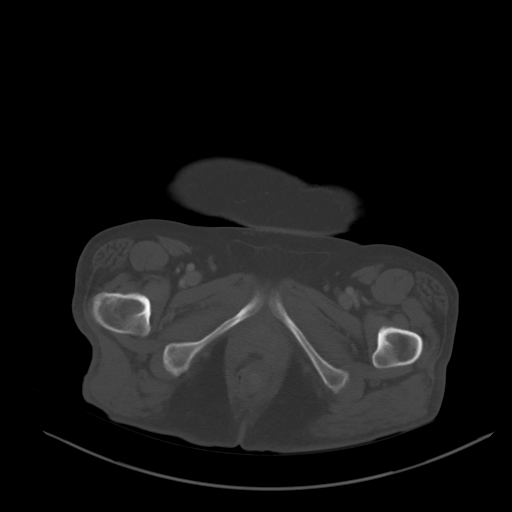
[im 13/85  soft-tissue]
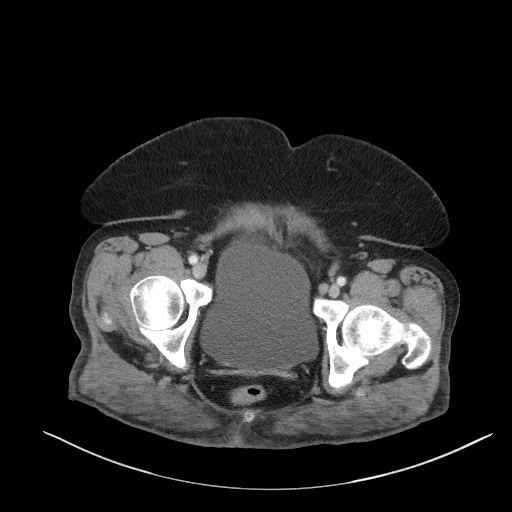
[im 17/85  soft-tissue]
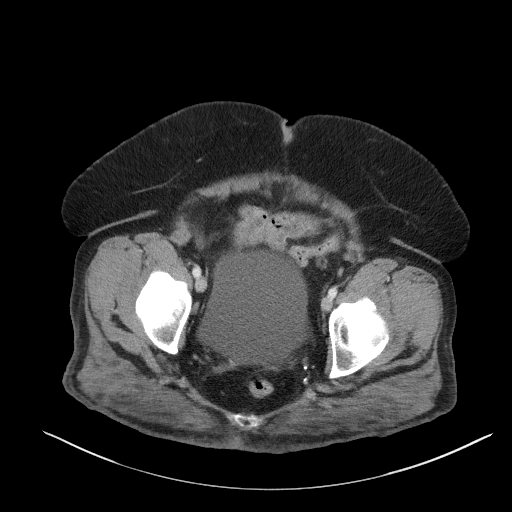
[im 25/85  soft-tissue]
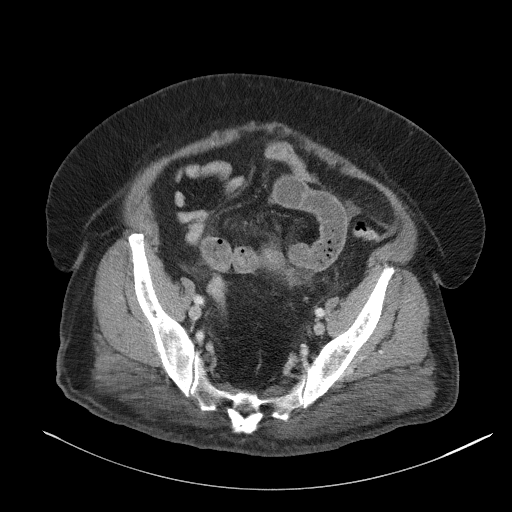
[im 29/85  soft-tissue]
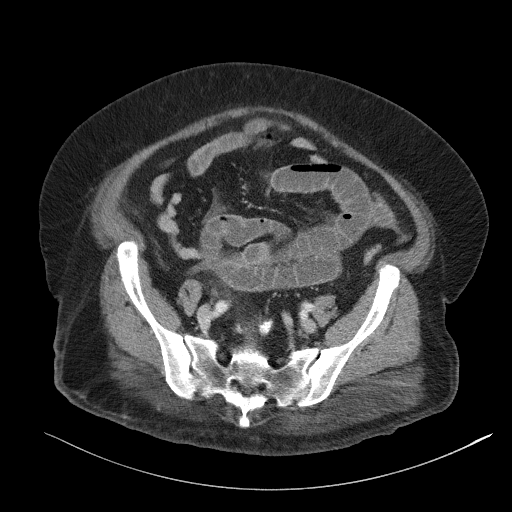
[im 37/85  soft-tissue]
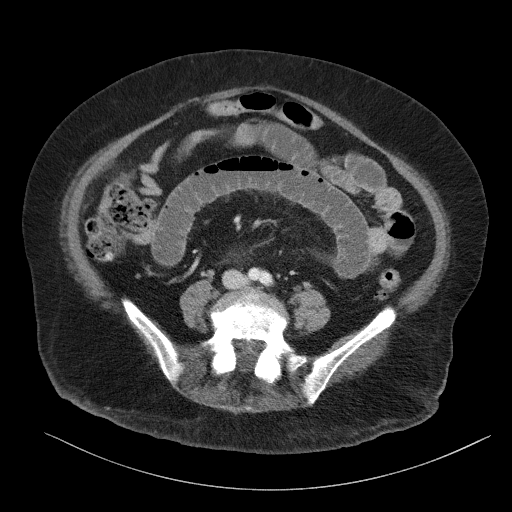
[im 45/85  soft-tissue]
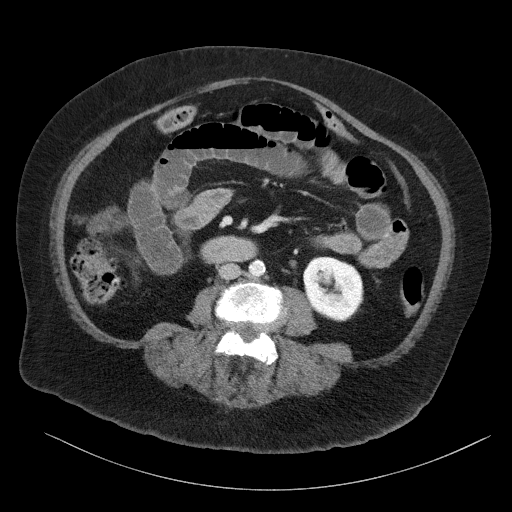
[im 49/85  soft-tissue]
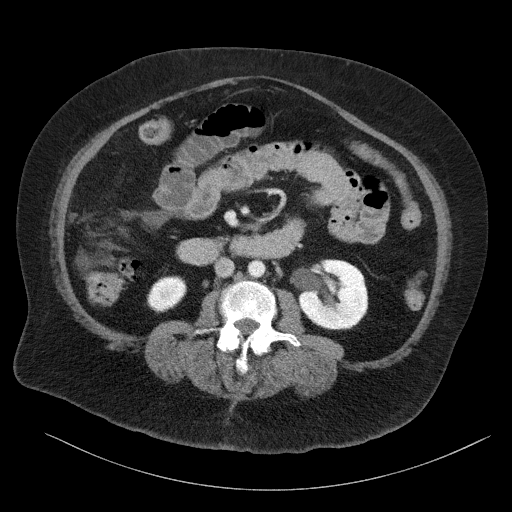
[im 57/85  soft-tissue]
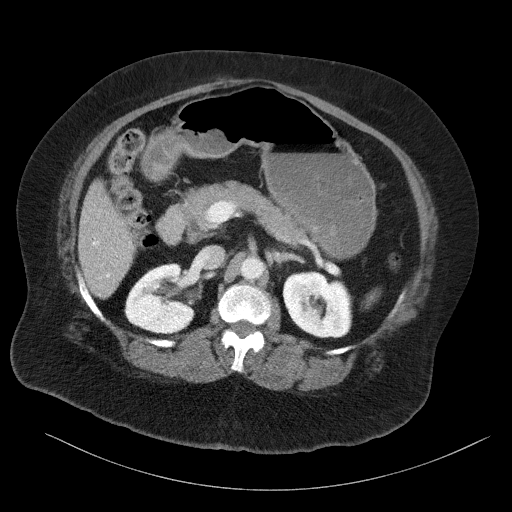
[im 57/85  bone]
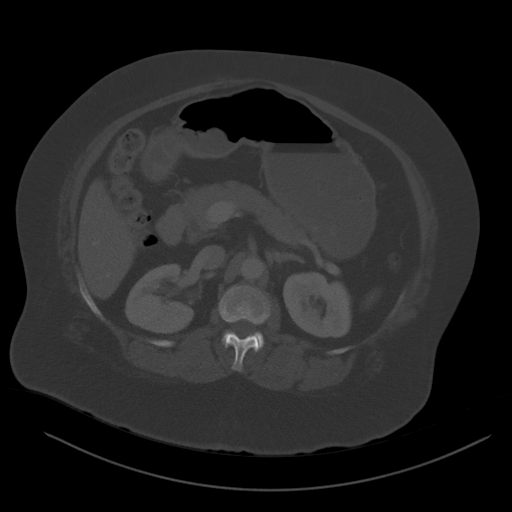
[im 61/85  soft-tissue]
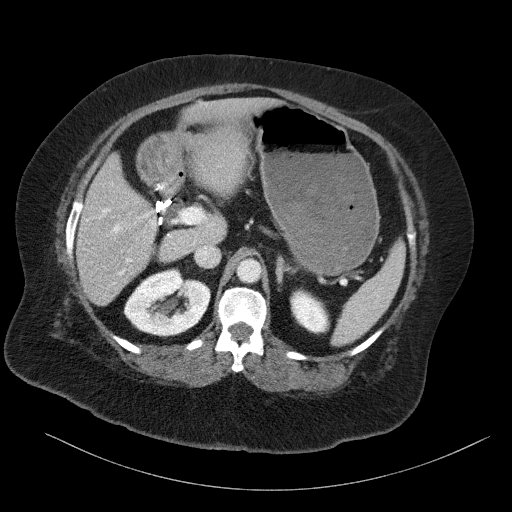
[im 69/85  soft-tissue]
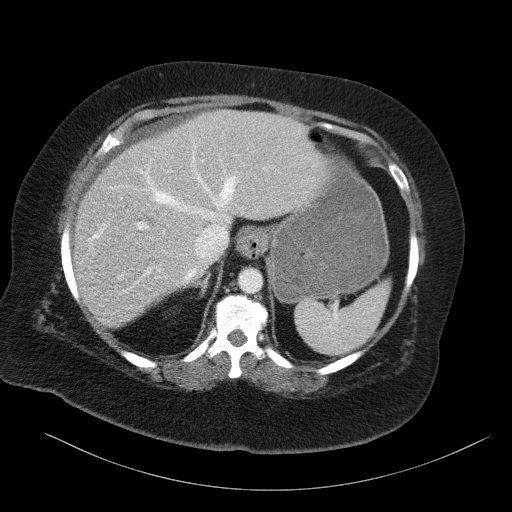
[im 73/85  soft-tissue]
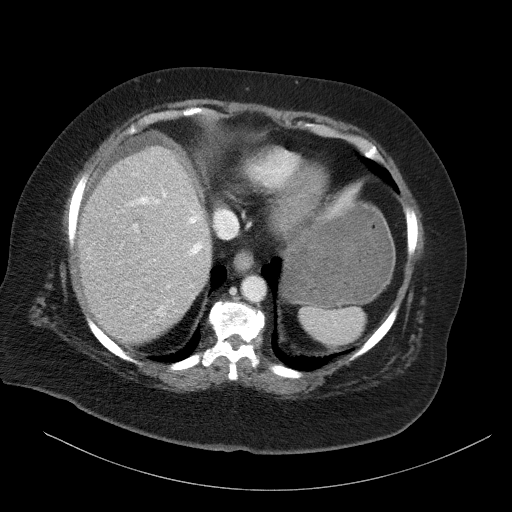
[im 81/85  soft-tissue]
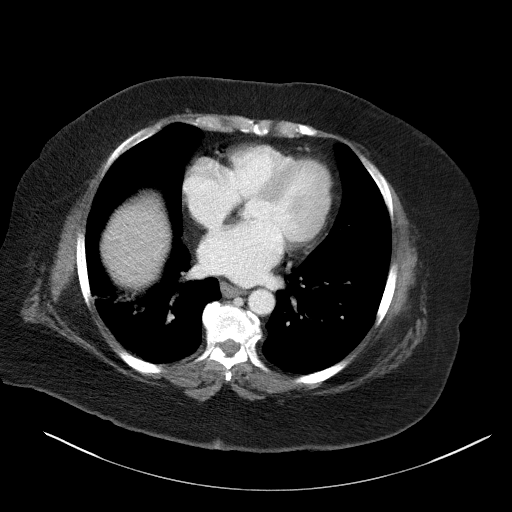

[Series 5: coronal st · coronal · 0.74mm/px · 3 of 117 slices shown]
[im 39/117  soft-tissue]
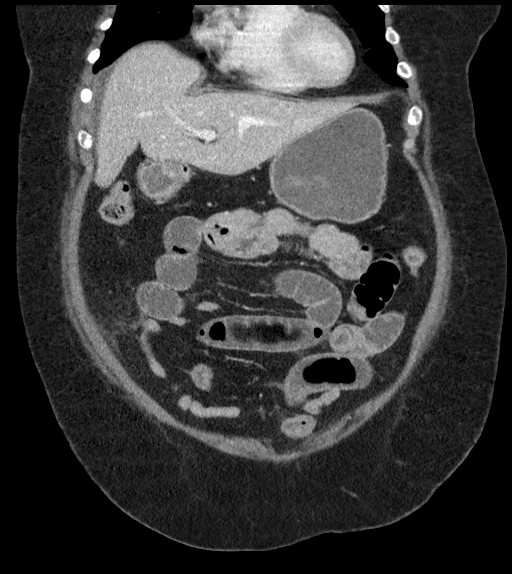
[im 52/117  soft-tissue]
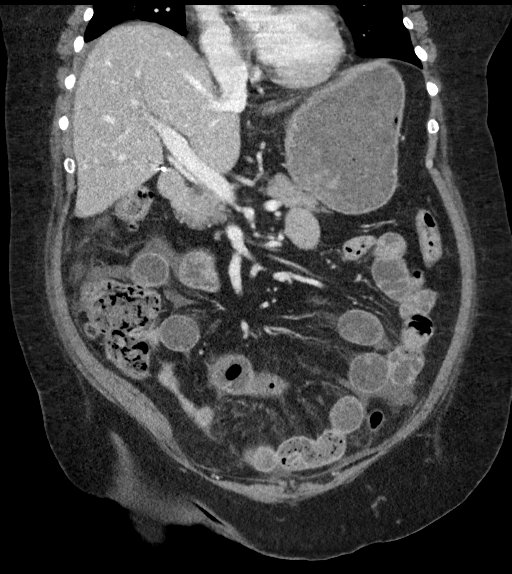
[im 65/117  soft-tissue]
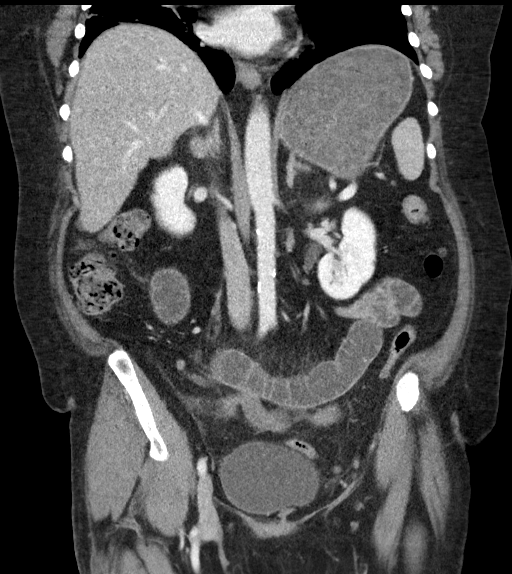

[16 of 46 positions shown; findings below may reference images not displayed]

FINDINGS: Lower chest: Mild basilar atelectasis. No pleural or pericardial
fluid.

Hepatobiliary: No liver parenchymal lesion. Previous
cholecystectomy.

Pancreas: Normal

Spleen: Normal

Adrenals/Urinary Tract: Adrenal glands are normal. Kidneys are
normal. No cyst, mass, stone or hydronephrosis. Normal appearance of
the bladder.

Stomach/Bowel: Small bowel obstruction, fairly high-grade with [REDACTED]
caliber change in the mid ileum. Normal appearing appendix. Free
intraperitoneal fluid, small in volume. Some strandiness of the
mesentery in the right mid abdomen probably related to some fluid
distribution. The possibility of peritoneal nodules is not
completely excluded.

Vascular/Lymphatic: Aortic atherosclerosis. No aneurysm. IVC is
normal. No retroperitoneal adenopathy.

Reproductive: Previous hysterectomy.  No pelvic mass.

Other: No free air.

Musculoskeletal: Chronic lower lumbar degenerative changes. Previous
decompressive surgery.
IMPRESSION: Acute high-grade small bowel obstruction, probably in the mid ileum.

Small a moderate amount of free intraperitoneal fluid. Some density
in the mesenteric in the right abdomen the probably represents fluid
distribution, but the possibility of peritoneal nodules is not
completely excluded.

## 2018-04-08 ENCOUNTER — Telehealth: Payer: Self-pay

## 2018-04-08 ENCOUNTER — Telehealth: Payer: Self-pay | Admitting: *Deleted

## 2018-04-08 DIAGNOSIS — R260 Ataxic gait: Secondary | ICD-10-CM | POA: Diagnosis not present

## 2018-04-08 DIAGNOSIS — Z794 Long term (current) use of insulin: Secondary | ICD-10-CM | POA: Diagnosis not present

## 2018-04-08 DIAGNOSIS — M4716 Other spondylosis with myelopathy, lumbar region: Secondary | ICD-10-CM | POA: Diagnosis not present

## 2018-04-08 DIAGNOSIS — M5442 Lumbago with sciatica, left side: Secondary | ICD-10-CM | POA: Diagnosis not present

## 2018-04-08 DIAGNOSIS — E119 Type 2 diabetes mellitus without complications: Secondary | ICD-10-CM | POA: Diagnosis not present

## 2018-04-08 DIAGNOSIS — M5136 Other intervertebral disc degeneration, lumbar region: Secondary | ICD-10-CM | POA: Diagnosis not present

## 2018-04-08 DIAGNOSIS — Q7649 Other congenital malformations of spine, not associated with scoliosis: Secondary | ICD-10-CM | POA: Diagnosis not present

## 2018-04-08 DIAGNOSIS — F060 Psychotic disorder with hallucinations due to known physiological condition: Secondary | ICD-10-CM | POA: Diagnosis not present

## 2018-04-08 DIAGNOSIS — M542 Cervicalgia: Secondary | ICD-10-CM | POA: Diagnosis not present

## 2018-04-08 NOTE — Telephone Encounter (Signed)
[name] with [facility] called wanted to schedule appt for [name]. Medicare was no longer going to pay for [facility] to come to the home but Medicaid will cover it as long as there is a face to face visit with [name]. Made the appt for 04/18/18. She just wanted to let you guys know. If you have any questions then you can call her back.

## 2018-04-08 NOTE — Telephone Encounter (Signed)
[name] (nurse with [facility]) called stating patient has had right flank pain that radiates to right lower abdomen and burning with urination x 3 days. No n/v. Just wanted to let us know.I let her know I'd send the doctor a message but there wouldn't be a response until tomorrow. Advised patient that is symptoms worsen she needs to go to the ED with verbal understanding. Spoke with patient directly and told her that if symptoms worsen she needs to go to the ED for evaluation.

## 2018-04-08 NOTE — Telephone Encounter (Signed)
Passing this along

## 2018-04-08 NOTE — Telephone Encounter (Signed)
noted 

## 2018-04-09 ENCOUNTER — Encounter: Payer: Self-pay | Admitting: Family Medicine

## 2018-04-09 ENCOUNTER — Ambulatory Visit (INDEPENDENT_AMBULATORY_CARE_PROVIDER_SITE_OTHER): Payer: Medicare HMO | Admitting: Family Medicine

## 2018-04-09 VITALS — BP 122/60 | HR 68 | Temp 98.9°F | Resp 15 | Ht 59.0 in | Wt 163.0 lb

## 2018-04-09 DIAGNOSIS — N3000 Acute cystitis without hematuria: Secondary | ICD-10-CM

## 2018-04-09 DIAGNOSIS — R109 Unspecified abdominal pain: Secondary | ICD-10-CM

## 2018-04-09 DIAGNOSIS — R110 Nausea: Secondary | ICD-10-CM

## 2018-04-09 DIAGNOSIS — I10 Essential (primary) hypertension: Secondary | ICD-10-CM

## 2018-04-09 DIAGNOSIS — R300 Dysuria: Secondary | ICD-10-CM

## 2018-04-09 LAB — POCT URINALYSIS DIP (CLINITEK)
Bilirubin, UA: NEGATIVE
Blood, UA: NEGATIVE
Glucose, UA: NEGATIVE mg/dL
Ketones, POC UA: NEGATIVE mg/dL
NITRITE UA: NEGATIVE
PH UA: 5.5 (ref 5.0–8.0)
POC PROTEIN,UA: 30 — AB
Spec Grav, UA: >=1.03 — AB (ref 1.010–1.025)
Urobilinogen, UA: 0.2 E.U./dL

## 2018-04-09 MED ORDER — CIPROFLOXACIN HCL 500 MG PO TABS: 500.0000 mg | ORAL_TABLET | Freq: Two times a day (BID) | ORAL | 0 refills | Status: DC

## 2018-04-09 MED ORDER — ONDANSETRON HCL 4 MG/2ML IJ SOLN
4.0000 mg | Freq: Once | INTRAMUSCULAR | Status: AC
  Administered 2018-04-09: 4 mg via INTRAMUSCULAR

## 2018-04-09 NOTE — Assessment & Plan Note (Signed)
3 day history,  With abnormal CCUA, send for  C/S , cipro x 3 days prscribed

## 2018-04-09 NOTE — Patient Instructions (Addendum)
F/U as before, call if you need me before   You are treated for acute cystitis, medication is sent in , we will follow culture  Zofran 4 mg I given in office for nausea, and GI Doctor  Will prescribe the  Medications.  Need to continue to cut back on smoking, NO MORE CIGARETTES

## 2018-04-09 NOTE — Assessment & Plan Note (Deleted)
Zofran 4 mg IM in office , will follow GI for nausea

## 2018-04-09 NOTE — Telephone Encounter (Signed)
Called PT for a work in appointment today, pt states that her aid did not come yesterday, and she is not sure she will come today. I asked her to call the aid to see if she could bring her in to be seen, at Dr Moshe Cipro request.  She will call back

## 2018-04-10 ENCOUNTER — Telehealth: Payer: Self-pay

## 2018-04-10 ENCOUNTER — Other Ambulatory Visit (HOSPITAL_COMMUNITY)
Admission: RE | Admit: 2018-04-10 | Discharge: 2018-04-10 | Disposition: A | Discharge status: Home / Self Care | Payer: Medicare HMO | Source: Ambulatory Visit | Attending: Family Medicine | Admitting: Family Medicine

## 2018-04-10 DIAGNOSIS — N3000 Acute cystitis without hematuria: Secondary | ICD-10-CM | POA: Insufficient documentation

## 2018-04-10 IMAGING — CT CT ABD-PELV W/ CM
2 of 5 series · 16 of 46 positions shown, 18 images · IV contrast (Isovue)
Comparison: 09/10/2016

CLINICAL DATA: Followup small bowel obstruction.

EXAM:
CT ABDOMEN AND PELVIS WITH CONTRAST
TECHNIQUE: Multidetector CT imaging of the abdomen and pelvis was performed
using the standard protocol following bolus administration of
intravenous contrast.
CONTRAST:  100mL [id number] IOPAMIDOL ([id number]) INJECTION 61%,
<See Chart> [id number] IOPAMIDOL ([id number]-VUU) INJECTION 61%

[Series 2: axial st · axial · 0.70mm/px · z∈[+691,+1076]mm · 13 of 87 slices shown, 15 images]
[im 5/87  soft-tissue]
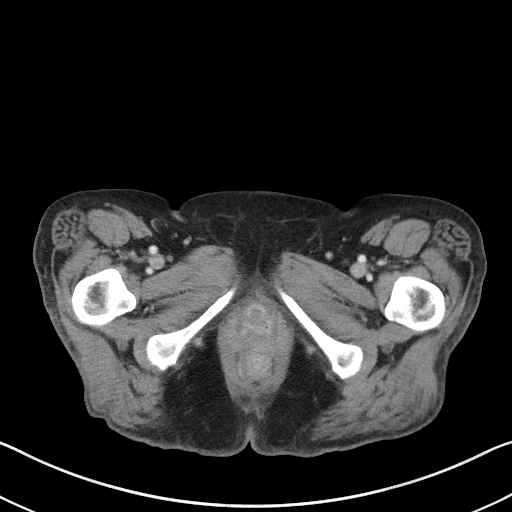
[im 5/87  bone]
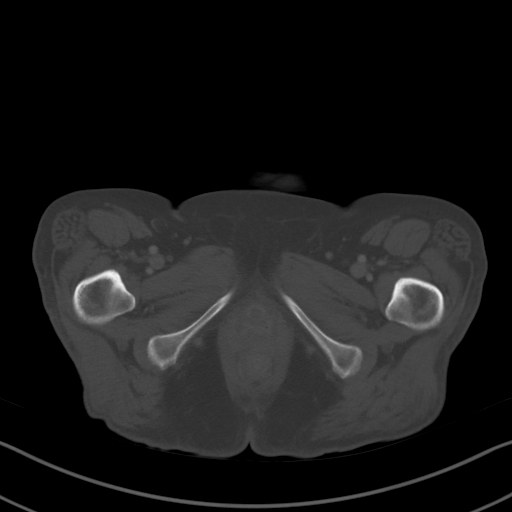
[im 10/87  soft-tissue]
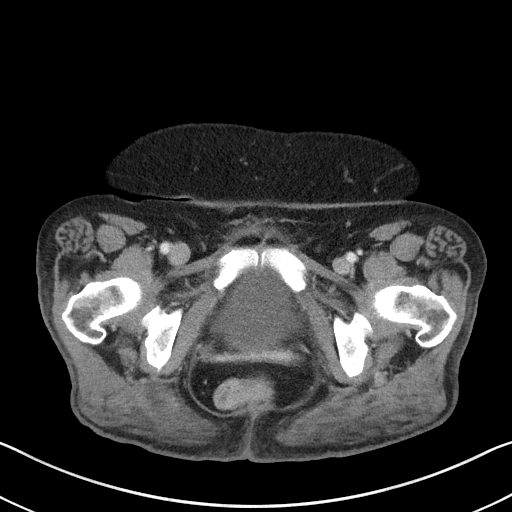
[im 20/87  soft-tissue]
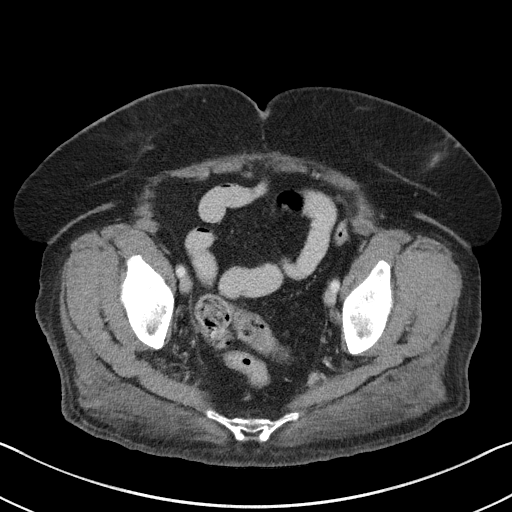
[im 24/87  soft-tissue]
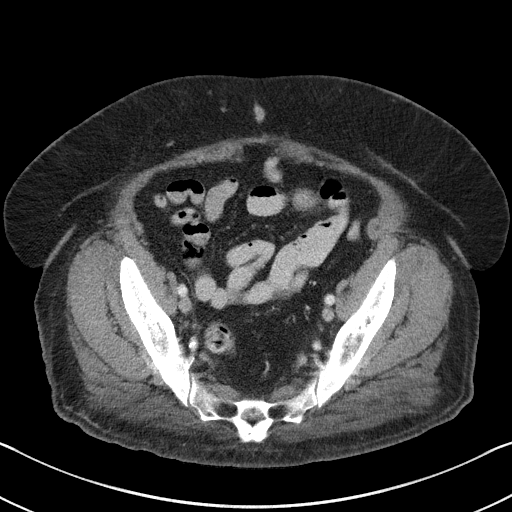
[im 29/87  soft-tissue]
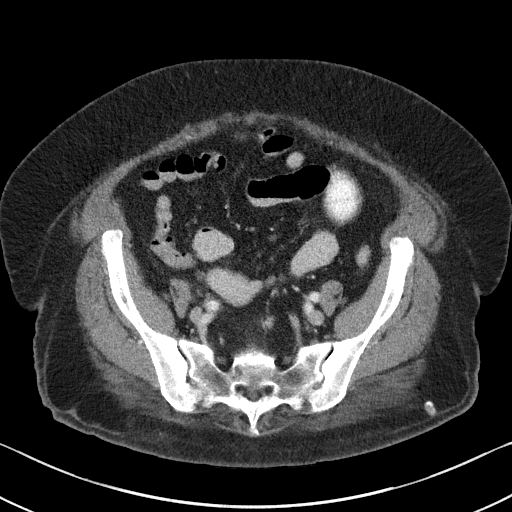
[im 39/87  soft-tissue]
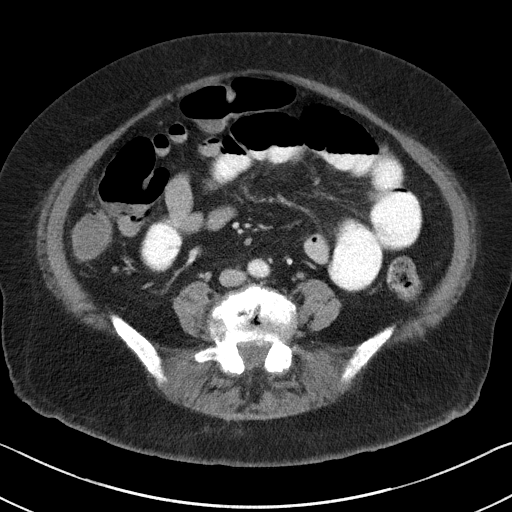
[im 44/87  soft-tissue]
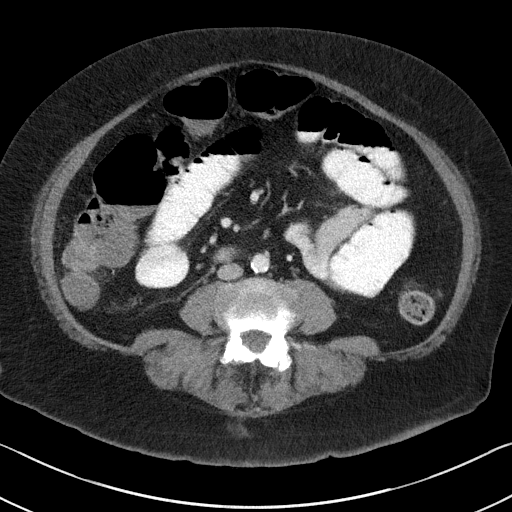
[im 48/87  soft-tissue]
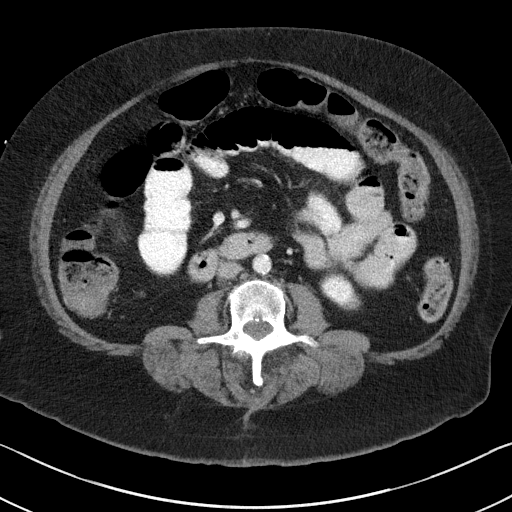
[im 58/87  soft-tissue]
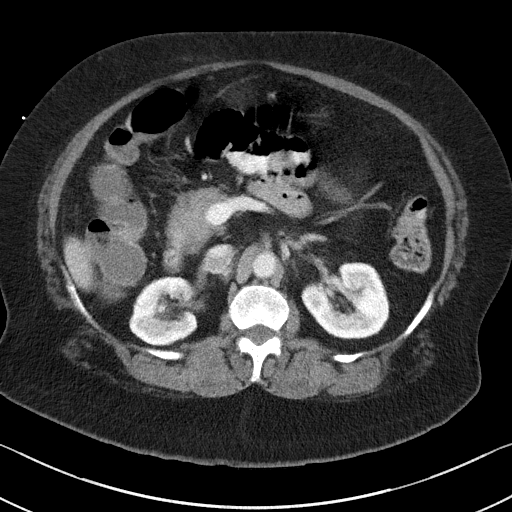
[im 58/87  bone]
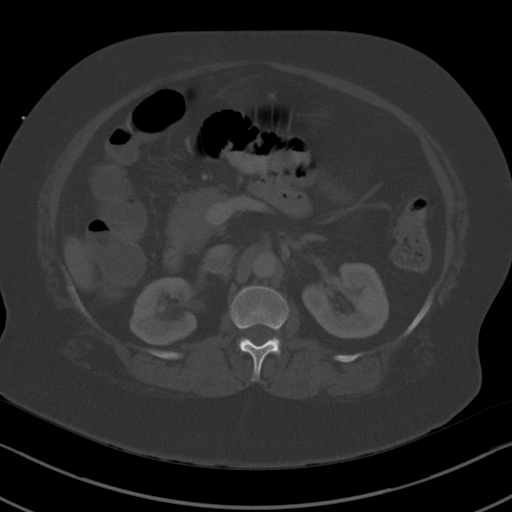
[im 63/87  soft-tissue]
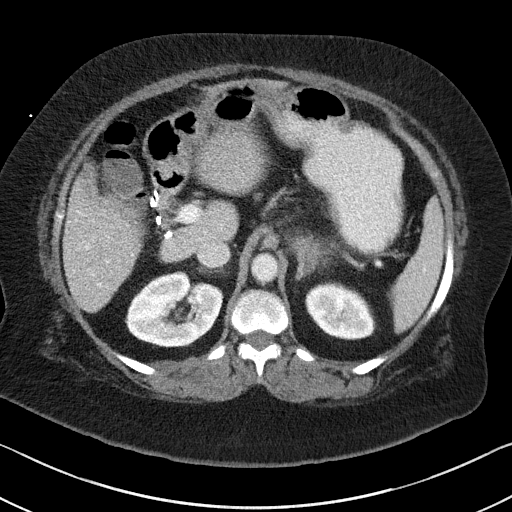
[im 67/87  soft-tissue]
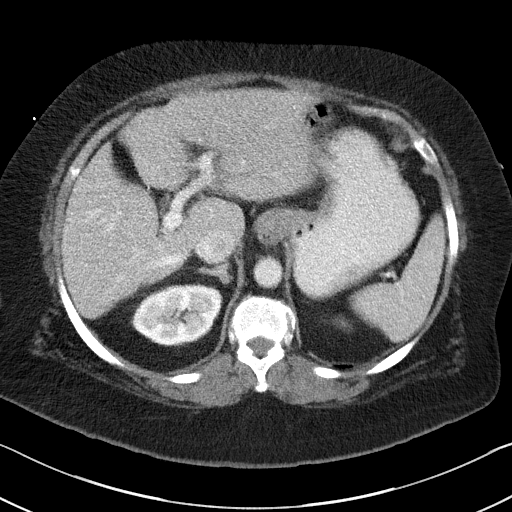
[im 77/87  soft-tissue]
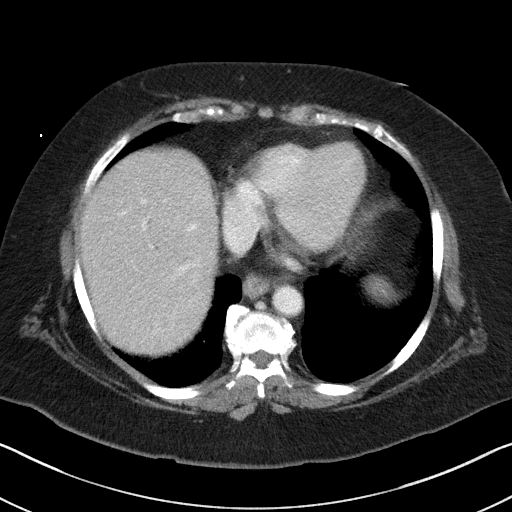
[im 82/87  soft-tissue]
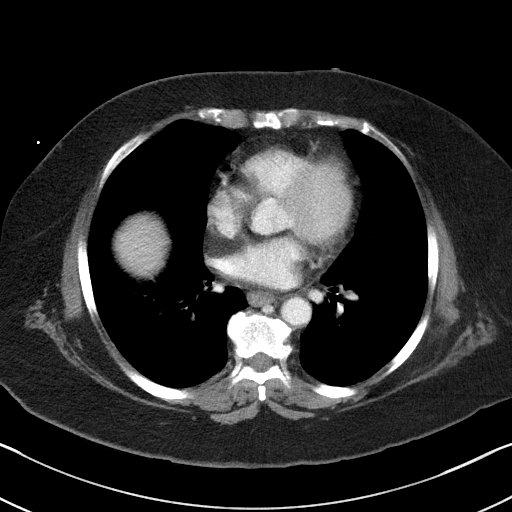

[Series 5: coronal st · coronal · 0.75mm/px · 3 of 111 slices shown]
[im 37/111  soft-tissue]
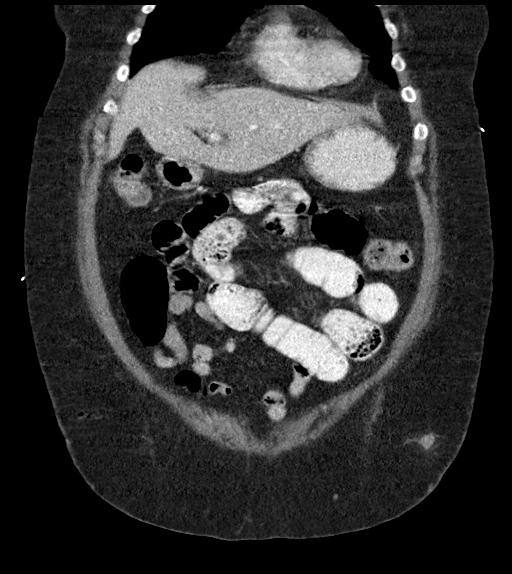
[im 49/111  soft-tissue]
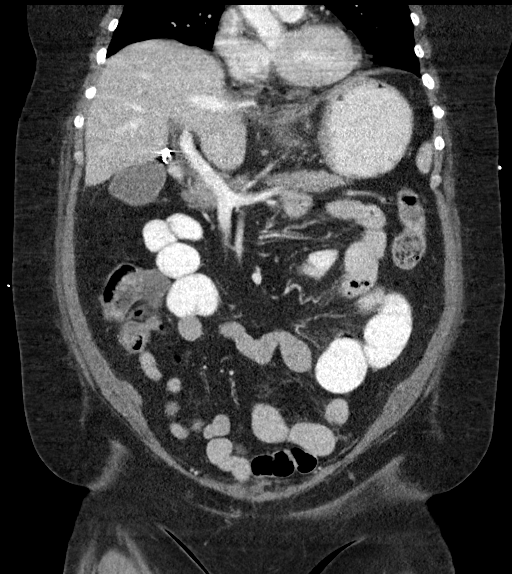
[im 62/111  soft-tissue]
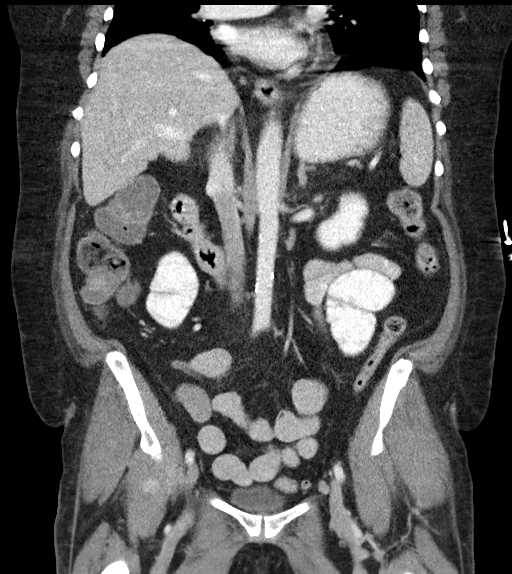

[16 of 46 positions shown; findings below may reference images not displayed]

FINDINGS: Lower chest: The lung bases are clear. No pleural effusion or
pulmonary lesions. The heart is normal in size. No pericardial
effusion.

Hepatobiliary: No focal hepatic lesions. Status post
cholecystectomy. Mild stable biliary dilatation.

Pancreas: No mass, inflammation or ductal dilatation.

Spleen: Normal size.  No focal lesions.

Adrenals/Urinary Tract: The adrenal glands and kidneys are normal.
The bladder is normal.

Stomach/Bowel: The stomach is unremarkable. The duodenum appears
normal. Persistent mild proximal small bowel dilatation with air
fluid and air contrast levels. There is a transition to normal/
decompressed small bowel loops and terminal ileum. There is moderate
fluid, stool and air scattered in the colon. Findings consistent
with a persistent but slightly improved partial small bowel
obstruction. This is most likely due to adhesions in the left
abdomen. The appendix is normal.

Vascular/Lymphatic: Stable moderate atherosclerotic calcifications
involving the aorta and iliac arteries. The major venous structures
are patent. No mesenteric or retroperitoneal mass or adenopathy.

Reproductive: Surgically absent.

Other: No pelvic mass or adenopathy. No free pelvic fluid
collections. No inguinal mass or adenopathy. No abdominal wall
hernia or subcutaneous lesions.

Musculoskeletal: No significant bony findings. Remote surgical
changes in the lower lumbar spine with decompressive laminectomies
at L4-5 and L5-S1.
IMPRESSION: Persistent but slightly improved partial small bowel obstruction.

## 2018-04-10 NOTE — Telephone Encounter (Signed)
Patient called and stated that she saw you yesterday, but you didn't give her anything for the pain in her ear. Please advise

## 2018-04-11 ENCOUNTER — Telehealth: Payer: Self-pay | Admitting: *Deleted

## 2018-04-11 ENCOUNTER — Other Ambulatory Visit: Payer: Self-pay | Admitting: Family Medicine

## 2018-04-11 DIAGNOSIS — M150 Primary generalized (osteo)arthritis: Secondary | ICD-10-CM | POA: Diagnosis not present

## 2018-04-11 DIAGNOSIS — I639 Cerebral infarction, unspecified: Secondary | ICD-10-CM | POA: Diagnosis not present

## 2018-04-11 DIAGNOSIS — R32 Unspecified urinary incontinence: Secondary | ICD-10-CM | POA: Diagnosis not present

## 2018-04-11 LAB — URINE CULTURE: Culture: <10000 — AB

## 2018-04-11 MED ORDER — CIPROFLOXACIN-HYDROCORTISONE 0.2-1 % OT SUSP: OTIC | 0 refills | Status: DC

## 2018-04-11 NOTE — Telephone Encounter (Signed)
Noted and aware.

## 2018-04-11 NOTE — Telephone Encounter (Signed)
Spoke with patient she is aware

## 2018-04-11 NOTE — Telephone Encounter (Signed)
[name] with [facility] called just wanted to let [name] know that she had the referral to fill [name] but she could not get to it today. She would go out tomorrow. If there are any questions can call [phone]

## 2018-04-11 NOTE — Telephone Encounter (Signed)
Medication is sent to [facility], pls let her know

## 2018-04-11 NOTE — Progress Notes (Signed)
Will ned to call [facility] to see what prescription med available for ear pain

## 2018-04-12 DIAGNOSIS — E1142 Type 2 diabetes mellitus with diabetic polyneuropathy: Secondary | ICD-10-CM | POA: Diagnosis not present

## 2018-04-12 DIAGNOSIS — Z79899 Other long term (current) drug therapy: Secondary | ICD-10-CM | POA: Diagnosis not present

## 2018-04-12 DIAGNOSIS — F250 Schizoaffective disorder, bipolar type: Secondary | ICD-10-CM | POA: Diagnosis not present

## 2018-04-12 DIAGNOSIS — Z794 Long term (current) use of insulin: Secondary | ICD-10-CM | POA: Diagnosis not present

## 2018-04-12 DIAGNOSIS — I110 Hypertensive heart disease with heart failure: Secondary | ICD-10-CM | POA: Diagnosis not present

## 2018-04-12 DIAGNOSIS — I69354 Hemiplegia and hemiparesis following cerebral infarction affecting left non-dominant side: Secondary | ICD-10-CM | POA: Diagnosis not present

## 2018-04-12 DIAGNOSIS — I509 Heart failure, unspecified: Secondary | ICD-10-CM | POA: Diagnosis not present

## 2018-04-12 DIAGNOSIS — F17210 Nicotine dependence, cigarettes, uncomplicated: Secondary | ICD-10-CM | POA: Diagnosis not present

## 2018-04-12 DIAGNOSIS — J449 Chronic obstructive pulmonary disease, unspecified: Secondary | ICD-10-CM | POA: Diagnosis not present

## 2018-04-13 DIAGNOSIS — J449 Chronic obstructive pulmonary disease, unspecified: Secondary | ICD-10-CM | POA: Diagnosis not present

## 2018-04-13 DIAGNOSIS — I10 Essential (primary) hypertension: Secondary | ICD-10-CM | POA: Diagnosis not present

## 2018-04-17 ENCOUNTER — Encounter: Payer: Self-pay | Admitting: Family Medicine

## 2018-04-17 ENCOUNTER — Ambulatory Visit (INDEPENDENT_AMBULATORY_CARE_PROVIDER_SITE_OTHER): Payer: Medicare HMO | Admitting: Family Medicine

## 2018-04-17 ENCOUNTER — Encounter (HOSPITAL_COMMUNITY): Payer: Self-pay | Admitting: Psychiatry

## 2018-04-17 ENCOUNTER — Ambulatory Visit (INDEPENDENT_AMBULATORY_CARE_PROVIDER_SITE_OTHER): Payer: Medicare HMO | Admitting: Psychiatry

## 2018-04-17 VITALS — BP 140/68 | HR 73 | Resp 14 | Ht 59.0 in | Wt 163.0 lb

## 2018-04-17 DIAGNOSIS — M4716 Other spondylosis with myelopathy, lumbar region: Secondary | ICD-10-CM | POA: Diagnosis not present

## 2018-04-17 DIAGNOSIS — J0100 Acute maxillary sinusitis, unspecified: Secondary | ICD-10-CM | POA: Insufficient documentation

## 2018-04-17 DIAGNOSIS — I10 Essential (primary) hypertension: Secondary | ICD-10-CM

## 2018-04-17 DIAGNOSIS — F331 Major depressive disorder, recurrent, moderate: Secondary | ICD-10-CM

## 2018-04-17 DIAGNOSIS — L853 Xerosis cutis: Secondary | ICD-10-CM

## 2018-04-17 MED ORDER — METHYLPREDNISOLONE ACETATE 80 MG/ML IJ SUSP
80.0000 mg | Freq: Once | INTRAMUSCULAR | Status: AC
  Administered 2018-04-17: 80 mg via INTRAMUSCULAR

## 2018-04-17 MED ORDER — DIMETHICONE-ZINC OXIDE 1.1-40 % EX OINT: 1.0000 "application " | TOPICAL_OINTMENT | Freq: Two times a day (BID) | CUTANEOUS | 1 refills | Status: DC

## 2018-04-17 NOTE — Assessment & Plan Note (Signed)
Asked:confirms currently smokes cigarettes Assess:willing to quit but cutting back, and unwilling to set a quit date Advise: needs to QUIT to reduce risk of cancer, cardio and cerebrovascular disease Assist: counseled for 5 minutes and literature provided Arrange: follow up in 3 months

## 2018-04-17 NOTE — Assessment & Plan Note (Signed)
Antibiotic and decongestant prescribed 

## 2018-04-17 NOTE — Assessment & Plan Note (Signed)
Antibiotic prescribed 

## 2018-04-17 NOTE — Progress Notes (Addendum)
   [name]     MRN: [id number]      DOB: 1950/08/02   HPI [name] is here with a 3 day h/o flank pain , dysuria and frequency, denies fever or  chill  C/o excess nausea, but has no emesi ROS Denies recent fever or chills. Denies sinus pressure, nasal congestion, ear pain or sore throat. Denies chest congestion, productive cough or wheezing. Denies chest pains, palpitations and leg swelling C/o chronic  joint pain, swelling and limitation in mobility.Has a h/o recurrent falls, requests lift chair after the visit, several weeks later. Has  Denies headaches, seizures, numbness, or tingling. Denies uncontrolled  depression, anxiety or insomnia. Denies skin break down or rash.   PE  BP 122/60   Pulse 68   Temp 98.9 F (37.2 C)   Resp 15   Ht 4\' 11"  (1.499 m)   Wt 163 lb (73.9 kg)   SpO2 98%   BMI 32.92 kg/m   Patient alert and oriented and in no cardiopulmonary distress.  HEENT: No facial asymmetry, EOMI,   oropharynx pink and moist.  Neck supple no JVD, no mass.  Chest: Clear to auscultation bilaterally.  CVS: S1, S2 no murmurs, no S3.Regular rate.  ABD: Soft non tender. Mild flank and suprapubic tenderness  Ext: No edema  MS: Adequate though reduced ROM spine, shoulders, hips and knees.  Skin: Intact, no ulcerations or rash noted.  Psych: Good eye contact, normal affect. Memory intact not anxious or depressed appearing.  CNS: CN 2-12 intact, power,  normal throughout.no focal deficits noted.   Assessment & Plan  Acute cystitis without hematuria 3 day history,  With abnormal CCUA, send for  C/S , cipro x 3 days prscribed  Nausea symptomatic ,  Needs maintenance treatment byGI, zofran 4 mg iM given in the office  Essential hypertension Controlled, no change in medication

## 2018-04-17 NOTE — Progress Notes (Signed)
   THERAPIST PROGRESS NOTE  Session Time: Wednesday 04/17/2018 10:45 AM - 11: 35 AM   Participation Level: Active  Behavioral Response: CasualAlertDepressed  Type of Therapy: Individual Therapy  Treatment Goals addressed: Learn and implement behavioral strategies to overcome depression.                                                   Verbalize an understanding and resolution of current interpersonal problems.   Interventions: CBT and Supportive  Summary: [name] is a 68 y.o. female who presents with  long standing history of recurrent periods  of depression beginning when she was 43 and her favorite uncle died. Patient reports multiple psychiatric hospitlaizations due to depression and suicidal ideations with the last one occuring in 1997. Patient has participated in outpatient psychotherapy and medication management intermittently since age 37. She currently is seeing psychiatrist Dr. Harrington Challenger . Prior to this, she was seen at [facility]. Patient also has had ECT at [facility]. Symptoms have worsened in recent months due to family stress and have  included depressed mood, anxiety, excessive worry, and tearfulness.   Patient last was seen in November 2019. She reports staying at home during the holidays but having visits from her son. She expresses frustration and disappointment he didn't spend more time with her. She is pleased her granddaughter stayed overnight with her and asked her to attend her birthday party. She expresses increased acceptance of son's choices and his relationship with his fiancee. She continues to express worry about grandson and reports catastrophizing thoughts about him when he didn't call her for about a month. Patient reports additional stress related to being sick. She hasn't been to church as much due to this but hopes to resume this week. She reports some tension in relationship with pastor due to disagreement about 2 months ago. She is taking sleep aid but  reports continued sleep difficulty stating she sleeps only about 3 hours per night.   Suicidal/Homicidal: Nowithout intent/plan  Therapist Response: Discussed stressors, facilitated expression of thoughts and feelings, validated feelings, praised and reinforced patient being assertive regarding her right to celebrate holidays at home,  assisted patient examine her pattern of interaction and communication, discussed effects on relationships, discussed connection between thoughts/mood/behavior regarding her concerns about grandson, assisted patient identify,challenge,  and replace catastrophizing thoughts,  Plan: Return again in 2 weeks.  Diagnosis: Axis I: MDD, Recurrent, moderate    Axis II: Deferred    [name] Smoker, LCSW 04/17/2018

## 2018-04-17 NOTE — Assessment & Plan Note (Signed)
symptomatic ,  Needs maintenance treatment byGI, zofran 4 mg iM given in the office

## 2018-04-17 NOTE — Patient Instructions (Addendum)
    Thank you for coming into the office today. I appreciate the opportunity to provide you with the care for your back pain. I hope today's injection helps with your back pain until you can get back in to pain management clinic.  I have sent in a cream for your elbow, use this twice daily for the next 3 weeks. Once in the morning and once in the evening.   It was a pleasure to see you and I look forward to continuing to work together on your health and well-being. Please do not hesitate to call the office if you need care or have questions about your care.  Have a wonderful day and week.  With Gratitude,  Cherly Beach, DNP, AGNP-BC

## 2018-04-17 NOTE — Assessment & Plan Note (Signed)
Stable and managed by psych 

## 2018-04-17 NOTE — Assessment & Plan Note (Signed)
Controlled, no change in medication  

## 2018-04-17 NOTE — Progress Notes (Signed)
   [name]     MRN: [id number]      DOB: [birth year]   HPI [name] is here with a 3 day h/o generalized body aches , fever, chills and cough productive of green sputum, mainly at night, feels ill. ROS . C/o increased sinus pressure and  nasal congestion, denies  ear pain or sore throat.  Denies chest pains, palpitations and leg swelling Denies abdominal pain, nausea, vomiting,diarrhea or constipation.   Denies dysuria, frequency, hesitancy or incontinence. Denies uncontrolled  joint pain, swelling and limitation in mobility. Denies headaches, seizures, numbness, or tingling. Denies depression, anxiety or insomnia. Denies skin break down or rash.   PE  BP (!) 144/58   Pulse 77   Temp 99.5 F (37.5 C) (Oral)   Resp 15   Ht 4\' 11"  (1.499 m)   Wt 164 lb (74.4 kg)   SpO2 95%   BMI 33.12 kg/m   Patient alert and oriented and in no cardiopulmonary distress .Ill appearing  HEENT: No facial asymmetry, EOMI,   oropharynx pink and moist.  Neck decreased ROM, maxillary sinus tenderness  no JVD, no mass.Bilateral anterior cervical adenitis  Chest: decreased air entry scattered crackles, no wheezes CVS: S1, S2 no murmurs, no S3.Regular rate.  ABD: Soft non tender.   Ext: No edema  MS: Decreased  ROM spine,  Adequate in shoulders, hips and knees.  Skin: Intact, no ulcerations or rash noted.  Psych: Good eye contact, normal affect. Memory intact not anxious or depressed appearing.  CNS: CN 2-12 intact, power,  normal throughout.no focal deficits noted.   Assessment & Plan   Acute maxillary sinusitis Antibiotic prescribed  Acute bronchitis with COPD (HCC) Antibiotic and decongestant prescribed  Tobacco user Asked:confirms currently smokes cigarettes Assess:willing to quit but cutting back, and unwilling to set a quit date Advise: needs to QUIT to reduce risk of cancer, cardio and cerebrovascular disease Assist: counseled for 5 minutes and literature  provided Arrange: follow up in 3 months   Bipolar disorder ([place]) Stable and managed by psych

## 2018-04-17 NOTE — Progress Notes (Signed)
Subjective:    Patient ID: [name], female    DOB: Sep 14, 1950, 68 y.o.   MRN: [id number]  Chief Complaint  Patient presents with  . Back Pain    lower back 7/10 pain scale    HPI Patient is in today for lower back pain 7/10 in office. This is a chronic condition and she is followed by Pain Management (Dr Nelva Bush). Onset was years ago. She reports its just gets worse with time. She reports having trouble with daily activities more than usual and would like some to help her feel better until her pain management appt on 2/13. Of note, she has had a back sx in the past.   Most recent MRI post sx showed:    Severe right foraminal narrowing at L4-5 due to disc and facet arthropathy has worsened since the prior MRI. Mild to moderate left foraminal narrowing also appears worse. The patient is status post decompression at this level and the central canal is open.  Diffuse degenerative disc disease in the thoracic spine without disc protrusion or neural impingement.   She additionally reports having a fall on Monday. She is unsure how she tripped. She denies any injury. She denies blacking out. Reports the vacuum got in the way. She fell against it with her left arm.She has no complaints with this arm or shoulder area today. Just wanted to make [name] aware she fell.    Outpatient Medications Prior to Visit  Medication Sig Dispense Refill  . ACCU-CHEK FASTCLIX LANCETS MISC Use to check blood sugar 4 times a day 306 each 0  . albuterol (PROVENTIL HFA;VENTOLIN HFA) 108 (90 Base) MCG/ACT inhaler Inhale 1-2 puffs every 6 hours as needed for wheezing, shortness of breath 3 Inhaler 3  . amLODipine (NORVASC) 10 MG tablet Take 1 tablet (10 mg total) by mouth daily. 90 tablet 3  . ascorbic acid (VITAMIN C) 500 MG tablet Take 500 mg by mouth daily.    [name] aspirin EC 81 MG tablet Take 81 mg by mouth daily.    . Blood Glucose Monitoring Suppl (ACCU-CHEK AVIVA) device Use as instructed 1 each 0  .  cetirizine (ZYRTEC) 10 MG tablet Take 1 tablet (10 mg total) by mouth daily. 7 tablet 0  . Cholecalciferol (VITAMIN D3) 5000 units CAPS Take 1 capsule by mouth daily.    . cloNIDine (CATAPRES) 0.1 MG tablet Take 1 tablet (0.1 mg total) by mouth 2 (two) times daily. Take 0.1 mg for 3 days and then stop 3 tablet 0  . diclofenac sodium (VOLTAREN) 1 % GEL Apply 2 g topically 4 (four) times daily. 1 Tube 3  . docusate sodium (COLACE) 100 MG capsule Take 100 mg by mouth 2 (two) times daily.    . DROPLET PEN NEEDLES 32G X 4 MM MISC USE  TO INJECT FOUR TIMES DAILY 400 each 0  . glucose blood (ACCU-CHEK AVIVA) test strip Use to check blood sugar 4 times a day 400 each 2  . hydroxychloroquine (PLAQUENIL) 200 MG tablet Take 200 mg by mouth daily.     . hydrOXYzine (ATARAX/VISTARIL) 10 MG tablet Take 1 tablet (10 mg total) by mouth 3 (three) times daily as needed. 30 tablet 0  . insulin aspart (NOVOLOG FLEXPEN) 100 UNIT/ML FlexPen INJECT 10 TO 14 UNITS INTO THE SKIN 3 (THREE) TIMES DAILY WITH MEALS. 45 mL 3  . Insulin Glargine (TOUJEO SOLOSTAR) 300 UNIT/ML SOPN Inject 20 Units into the skin at bedtime. 9 pen  3  . lamoTRIgine (LAMICTAL) 100 MG tablet Take 1 tablet (100 mg total) by mouth 2 (two) times daily. 180 tablet 2  . levothyroxine (SYNTHROID, LEVOTHROID) 50 MCG tablet TAKE 1 TABLET  DAILY  EXCEPT TAKE 1/2 TABLET  ON  SUNDAY 85 tablet 1  . LORazepam (ATIVAN) 0.5 MG tablet Take 1 tablet (0.5 mg total) by mouth 2 (two) times daily. 180 tablet 2  . losartan (COZAAR) 50 MG tablet Take 1 tablet (50 mg total) by mouth daily. 90 tablet 1  . metoprolol tartrate (LOPRESSOR) 50 MG tablet TAKE 1 TABLET TWICE DAILY (NEED MD APPOINTMENT) 180 tablet 2  . montelukast (SINGULAIR) 10 MG tablet TAKE 1 TABLET EVERY DAY 90 tablet 1  . Multiple Vitamins-Minerals (HM MULTIVITAMIN ADULT GUMMY PO) Take 1 tablet by mouth daily.    . Nicotine (NICOTROL NS) 10 MG/ML SOLN Use 1 spray per nostril up to eight times per day as needed  10 mL 0  . nystatin (MYCOSTATIN/NYSTOP) powder APPLY TO AFFECTED AREA 4 TIMES DAILY. 30 g 2  . ondansetron (ZOFRAN) 4 MG tablet Take 1 tablet by mouth every 6 hours as needed for nausea. 50 tablet 1  . oxyCODONE-acetaminophen (PERCOCET/ROXICET) 5-325 MG tablet Take 1 tablet by mouth every 6 (six) hours as needed.     . pregabalin (LYRICA) 75 MG capsule Take 1 capsule (75 mg total) by mouth daily.    . promethazine-dextromethorphan (PROMETHAZINE-DM) 6.25-15 MG/5ML syrup Take one teaspoon at bedtime for excessive cough, as needed 118 mL 0  . RABEprazole (ACIPHEX) 20 MG tablet Take 1 tablet (20 mg total) by mouth 2 (two) times daily. 180 tablet 3  . RESTASIS 0.05 % ophthalmic emulsion Place 1 drop into both eyes 2 (two) times daily.     . risperiDONE (RISPERDAL) 0.5 MG tablet Take 1 tablet (0.5 mg total) by mouth at bedtime. 90 tablet 2  . rosuvastatin (CRESTOR) 5 MG tablet TAKE 1 TABLET AT BEDTIME 90 tablet 1  . sertraline (ZOLOFT) 100 MG tablet Take 1 tablet (100 mg total) by mouth daily. 90 tablet 2  . traZODone (DESYREL) 150 MG tablet Take 0.5 tablets (75 mg total) by mouth at bedtime. 135 tablet 2  . triamcinolone ointment (KENALOG) 0.1 % Apply 1 application topically at bedtime. Apply to elbows at bedtime.    [name] UNABLE TO FIND Transport Chair x 1  Dx-bilateral leg weakness, frequent falls 1 each 0  . UNABLE TO FIND Rolling walker with seat x 1  DX. Bilateral lower extremity weakness, frequent falls, instability 1 each 0  . azithromycin (ZITHROMAX) 250 MG tablet Take two tablets I on day one, then take one tablet once daily for an additional four days (Patient not taking: Reported on 04/17/2018) 6 tablet 0  . benzonatate (TESSALON) 100 MG capsule Take 1 capsule (100 mg total) by mouth 2 (two) times daily as needed for cough. (Patient not taking: Reported on 04/17/2018) 14 capsule 0  . ciprofloxacin (CIPRO) 500 MG tablet Take 1 tablet (500 mg total) by mouth 2 (two) times daily. (Patient not taking:  Reported on 04/17/2018) 6 tablet 0  . ciprofloxacin-hydrocortisone (CIPRO HC OTIC) OTIC suspension Two drops to affected ear (s) two times daily for 7 days (Patient not taking: Reported on 04/17/2018) 10 mL 0  . predniSONE (DELTASONE) 5 MG tablet Take one tablet two times daily for 3 days (Patient not taking: Reported on 04/17/2018) 6 tablet 0   No facility-administered medications prior to visit.     Allergies  Allergen Reactions  . Cephalexin Hives  . Iron Nausea And Vomiting  . Milk-Related Compounds Other (See Comments)    Doesn't agree with stomach.   . Penicillins Hives    Has patient had a PCN reaction causing immediate rash, facial/tongue/throat swelling, SOB or lightheadedness with hypotension: Yes Has patient had a PCN reaction causing severe rash involving mucus membranes or skin necrosis: No Has patient had a PCN reaction that required hospitalization No Has patient had a PCN reaction occurring within the last 10 years: No If all of the above answers are "NO", then may proceed with Cephalosporin use.   [name] Phenazopyridine Hcl Hives          Review of Systems  Constitutional: Positive for malaise/fatigue. Negative for chills and fever.  HENT: Negative for congestion and sinus pain.   Eyes: Negative for pain.  Respiratory: Negative for shortness of breath and wheezing.   Cardiovascular: Negative for chest pain and palpitations.       Heaviness-thinks related to depression.   Musculoskeletal: Positive for back pain, joint pain and myalgias.  Neurological: Positive for weakness and headaches. Negative for seizures.  Psychiatric/Behavioral: Positive for depression. The patient is nervous/anxious.   All other systems reviewed and are negative.      Objective:    Physical Exam  Constitutional: She appears well-developed and well-nourished.  HENT:  Head: Normocephalic.  Eyes:  Wears glasses  Neck: Normal range of motion. Neck supple.  Cardiovascular: Normal rate and  regular rhythm.  Pulmonary/Chest: Effort normal and breath sounds normal.  Musculoskeletal:     Cervical back: She exhibits decreased range of motion and pain.     Thoracic back: She exhibits tenderness and pain.     Lumbar back: She exhibits tenderness and pain.     Comments: Generalized back pain with palpitations. Tightness in shoulers. No deformity noted. No injury to shoulder and arm on left side from fall on Monday (2/3). Decreased mobility. Uses walker  Neurological: She is alert.  Skin: Skin is warm and dry.  Right elbow is darkened, scaly and very dry.  Left is not as much.   Nursing note and vitals reviewed.   BP (!) 144/78   Pulse 73   Resp 14   Ht 4\' 11"  (1.499 m)   Wt 163 lb (73.9 kg)   SpO2 98%   BMI 32.92 kg/m  Wt Readings from Last 3 Encounters:  04/17/18 163 lb (73.9 kg)  04/09/18 163 lb (73.9 kg)  03/20/18 164 lb (74.4 kg)        Assessment & Plan:   1. Essential hypertension Blood pressure is elevated today in the office she reports taking her medications as directed. Recheck was a little better. Advised to continue medications at this time will reassess at next visit. Elevation could be pain related.    2. Lumbar spondylosis with myelopathy Chronic back pain s/p sx in 2012. She does not want sx again at this time, but she is having a harder time with the pain. She is followed by Pain Management and has an appt next week. Will give Depo-Medrol today in office to help her in the short term before being seen.   - methylPREDNISolone acetate (DEPO-MEDROL) injection 80 mg  3. Dry skin dermatitis Has been on Kenalog for her elbows in the past, reported that it did not work well. Skin appears darkened, scaly and very dry. Ordered Dimethicone and Zinc-Oxide cream to see if that can help hold moisture and prevent discomfort  of severe dry skin.  - Dimethicone-Zinc Oxide 1.1-40 % OINT; Apply 1 application topically 2 (two) times daily for 21 days.  Dispense: 1  Tube; Refill: [place], NP

## 2018-04-18 ENCOUNTER — Ambulatory Visit: Payer: Self-pay | Admitting: Family Medicine

## 2018-04-19 DIAGNOSIS — Z79899 Other long term (current) drug therapy: Secondary | ICD-10-CM | POA: Diagnosis not present

## 2018-04-19 DIAGNOSIS — I509 Heart failure, unspecified: Secondary | ICD-10-CM | POA: Diagnosis not present

## 2018-04-19 DIAGNOSIS — F17210 Nicotine dependence, cigarettes, uncomplicated: Secondary | ICD-10-CM | POA: Diagnosis not present

## 2018-04-19 DIAGNOSIS — F250 Schizoaffective disorder, bipolar type: Secondary | ICD-10-CM | POA: Diagnosis not present

## 2018-04-19 DIAGNOSIS — I69354 Hemiplegia and hemiparesis following cerebral infarction affecting left non-dominant side: Secondary | ICD-10-CM | POA: Diagnosis not present

## 2018-04-19 DIAGNOSIS — E1142 Type 2 diabetes mellitus with diabetic polyneuropathy: Secondary | ICD-10-CM | POA: Diagnosis not present

## 2018-04-19 DIAGNOSIS — I110 Hypertensive heart disease with heart failure: Secondary | ICD-10-CM | POA: Diagnosis not present

## 2018-04-19 DIAGNOSIS — J449 Chronic obstructive pulmonary disease, unspecified: Secondary | ICD-10-CM | POA: Diagnosis not present

## 2018-04-19 DIAGNOSIS — Z794 Long term (current) use of insulin: Secondary | ICD-10-CM | POA: Diagnosis not present

## 2018-04-23 NOTE — Assessment & Plan Note (Signed)
Continuing efforts to educate, support and strongly encourage smoking cessation

## 2018-04-23 NOTE — Assessment & Plan Note (Signed)
We will try to help her get her CPAP machine serviced, if needed.  I talked with her again about compliance goals and medical benefits of treating sleep apnea, treatment options, and need to contact us if she is having difficulty. Plan-continue CPAP auto 5-15

## 2018-04-23 NOTE — Assessment & Plan Note (Signed)
Educated on medications and use of inhalers. Plan-add Anoro as a maintenance controller, refill albuterol HFA

## 2018-04-24 ENCOUNTER — Ambulatory Visit (INDEPENDENT_AMBULATORY_CARE_PROVIDER_SITE_OTHER): Payer: Medicare HMO | Admitting: Psychiatry

## 2018-04-24 DIAGNOSIS — F331 Major depressive disorder, recurrent, moderate: Secondary | ICD-10-CM | POA: Diagnosis not present

## 2018-04-24 NOTE — Progress Notes (Addendum)
   THERAPIST PROGRESS NOTE  Session Time: Wednesday 04/24/2018 11:09 AM  -11:50 AM          Participation Level: Active  Behavioral Response: CasualAlertDepressed  Type of Therapy: Individual Therapy  Treatment Goals addressed: Learn and implement behavioral strategies to overcome depression.                                                   Verbalize an understanding and resolution of current interpersonal problems.   Interventions: CBT and Supportive  Summary: [name] is a 68 y.o. female who presents with  long standing history of recurrent periods  of depression beginning when she was 10 and her favorite uncle died. Patient reports multiple psychiatric hospitlaizations due to depression and suicidal ideations with the last one occuring in 1997. Patient has participated in outpatient psychotherapy and medication management intermittently since age 7. She currently is seeing psychiatrist Dr. Harrington Challenger . Prior to this, she was seen at [facility]. Patient also has had ECT at [facility]. Symptoms have worsened in recent months due to family stress and have  included depressed mood, anxiety, excessive worry, and tearfulness.   Patient last was about a week ago. She states having ups and downs. She continues to report sleep difficulty and says she only sleeps about 3 hours per night. She did resume attending church this past Sunday and reports being glad she went. However, she continues to express frustration regarding her pastor and expresses hurt church members did not check on her when she was sick. Patient is pleased she has stopped smoking.  Suicidal/Homicidal: Nowithout intent/plan  Therapist Response: Discussed stressors, facilitated expression of thoughts and feelings, validated feelings, praised and reinforced patient's increased involvement in activity, discussed effects,  assisted patient examine her pattern of interaction and communication, discussed effects on relationships,  assisted patient identify ways to improve sleep hygiene, praised and reinforced patient discontinuing smoking.  Plan: Return again in 2 weeks.  Diagnosis: Axis I: MDD, Recurrent, moderate    Axis II: Deferred    [name], LCSW 04/24/2018

## 2018-04-25 DIAGNOSIS — F250 Schizoaffective disorder, bipolar type: Secondary | ICD-10-CM | POA: Diagnosis not present

## 2018-04-25 DIAGNOSIS — I69354 Hemiplegia and hemiparesis following cerebral infarction affecting left non-dominant side: Secondary | ICD-10-CM | POA: Diagnosis not present

## 2018-04-25 DIAGNOSIS — E1142 Type 2 diabetes mellitus with diabetic polyneuropathy: Secondary | ICD-10-CM | POA: Diagnosis not present

## 2018-04-25 DIAGNOSIS — F17210 Nicotine dependence, cigarettes, uncomplicated: Secondary | ICD-10-CM | POA: Diagnosis not present

## 2018-04-25 DIAGNOSIS — I509 Heart failure, unspecified: Secondary | ICD-10-CM | POA: Diagnosis not present

## 2018-04-25 DIAGNOSIS — Z794 Long term (current) use of insulin: Secondary | ICD-10-CM | POA: Diagnosis not present

## 2018-04-25 DIAGNOSIS — Z79899 Other long term (current) drug therapy: Secondary | ICD-10-CM

## 2018-04-25 DIAGNOSIS — I110 Hypertensive heart disease with heart failure: Secondary | ICD-10-CM | POA: Diagnosis not present

## 2018-04-25 DIAGNOSIS — J449 Chronic obstructive pulmonary disease, unspecified: Secondary | ICD-10-CM | POA: Diagnosis not present

## 2018-04-26 ENCOUNTER — Inpatient Hospital Stay (HOSPITAL_COMMUNITY): Payer: Medicare HMO | Attending: Hematology

## 2018-04-26 DIAGNOSIS — I10 Essential (primary) hypertension: Secondary | ICD-10-CM | POA: Diagnosis not present

## 2018-04-26 DIAGNOSIS — E039 Hypothyroidism, unspecified: Secondary | ICD-10-CM | POA: Insufficient documentation

## 2018-04-26 DIAGNOSIS — K219 Gastro-esophageal reflux disease without esophagitis: Secondary | ICD-10-CM | POA: Insufficient documentation

## 2018-04-26 DIAGNOSIS — E785 Hyperlipidemia, unspecified: Secondary | ICD-10-CM | POA: Insufficient documentation

## 2018-04-26 DIAGNOSIS — R5383 Other fatigue: Secondary | ICD-10-CM | POA: Insufficient documentation

## 2018-04-26 DIAGNOSIS — E1143 Type 2 diabetes mellitus with diabetic autonomic (poly)neuropathy: Secondary | ICD-10-CM | POA: Diagnosis not present

## 2018-04-26 DIAGNOSIS — F319 Bipolar disorder, unspecified: Secondary | ICD-10-CM | POA: Insufficient documentation

## 2018-04-26 DIAGNOSIS — Z803 Family history of malignant neoplasm of breast: Secondary | ICD-10-CM | POA: Insufficient documentation

## 2018-04-26 DIAGNOSIS — M797 Fibromyalgia: Secondary | ICD-10-CM | POA: Diagnosis not present

## 2018-04-26 DIAGNOSIS — R002 Palpitations: Secondary | ICD-10-CM | POA: Diagnosis not present

## 2018-04-26 DIAGNOSIS — Z7982 Long term (current) use of aspirin: Secondary | ICD-10-CM | POA: Diagnosis not present

## 2018-04-26 DIAGNOSIS — M62830 Muscle spasm of back: Secondary | ICD-10-CM | POA: Diagnosis not present

## 2018-04-26 DIAGNOSIS — F17210 Nicotine dependence, cigarettes, uncomplicated: Secondary | ICD-10-CM | POA: Insufficient documentation

## 2018-04-26 DIAGNOSIS — K5900 Constipation, unspecified: Secondary | ICD-10-CM | POA: Diagnosis not present

## 2018-04-26 DIAGNOSIS — G894 Chronic pain syndrome: Secondary | ICD-10-CM | POA: Diagnosis not present

## 2018-04-26 DIAGNOSIS — Z79899 Other long term (current) drug therapy: Secondary | ICD-10-CM | POA: Insufficient documentation

## 2018-04-26 DIAGNOSIS — I700 Atherosclerosis of aorta: Secondary | ICD-10-CM | POA: Diagnosis not present

## 2018-04-26 DIAGNOSIS — I129 Hypertensive chronic kidney disease with stage 1 through stage 4 chronic kidney disease, or unspecified chronic kidney disease: Secondary | ICD-10-CM | POA: Insufficient documentation

## 2018-04-26 DIAGNOSIS — E1122 Type 2 diabetes mellitus with diabetic chronic kidney disease: Secondary | ICD-10-CM | POA: Insufficient documentation

## 2018-04-26 DIAGNOSIS — N189 Chronic kidney disease, unspecified: Secondary | ICD-10-CM | POA: Insufficient documentation

## 2018-04-26 DIAGNOSIS — E042 Nontoxic multinodular goiter: Secondary | ICD-10-CM | POA: Diagnosis not present

## 2018-04-26 DIAGNOSIS — J9811 Atelectasis: Secondary | ICD-10-CM | POA: Insufficient documentation

## 2018-04-26 DIAGNOSIS — D320 Benign neoplasm of cerebral meninges: Secondary | ICD-10-CM | POA: Diagnosis not present

## 2018-04-26 DIAGNOSIS — Z8673 Personal history of transient ischemic attack (TIA), and cerebral infarction without residual deficits: Secondary | ICD-10-CM | POA: Insufficient documentation

## 2018-04-26 DIAGNOSIS — Z86010 Personal history of colonic polyps: Secondary | ICD-10-CM | POA: Diagnosis not present

## 2018-04-26 DIAGNOSIS — D500 Iron deficiency anemia secondary to blood loss (chronic): Secondary | ICD-10-CM | POA: Diagnosis not present

## 2018-04-26 DIAGNOSIS — I272 Pulmonary hypertension, unspecified: Secondary | ICD-10-CM | POA: Diagnosis not present

## 2018-04-26 DIAGNOSIS — Z800 Family history of malignant neoplasm of digestive organs: Secondary | ICD-10-CM | POA: Insufficient documentation

## 2018-04-26 LAB — COMPREHENSIVE METABOLIC PANEL
ALT: 26 U/L (ref 0–44)
ANION GAP: 8 (ref 5–15)
AST: 24 U/L (ref 15–41)
Albumin: 3.9 g/dL (ref 3.5–5.0)
Alkaline Phosphatase: 149 U/L — ABNORMAL HIGH (ref 38–126)
BUN: 28 mg/dL — ABNORMAL HIGH (ref 8–23)
CO2: 24 mmol/L (ref 22–32)
Calcium: 8.8 mg/dL — ABNORMAL LOW (ref 8.9–10.3)
Chloride: 106 mmol/L (ref 98–111)
Creatinine, Ser: 1.02 mg/dL — ABNORMAL HIGH (ref 0.44–1.00)
GFR calc Af Amer: >60 mL/min (ref >60)
GFR calc non Af Amer: 57 mL/min — ABNORMAL LOW (ref >60)
GLUCOSE: 134 mg/dL — AB (ref 70–99)
Potassium: 3.9 mmol/L (ref 3.5–5.1)
Sodium: 138 mmol/L (ref 135–145)
Total Bilirubin: 0.4 mg/dL (ref 0.3–1.2)
Total Protein: 7.3 g/dL (ref 6.5–8.1)

## 2018-04-26 LAB — CBC WITH DIFFERENTIAL/PLATELET
ABS IMMATURE GRANULOCYTES: 0.04 10*3/uL (ref 0.00–0.07)
Basophils Absolute: 0 10*3/uL (ref 0.0–0.1)
Basophils Relative: 0 %
Eosinophils Absolute: 0.2 10*3/uL (ref 0.0–0.5)
Eosinophils Relative: 2 %
HCT: 36.6 % (ref 36.0–46.0)
Hemoglobin: 10.4 g/dL — ABNORMAL LOW (ref 12.0–15.0)
Immature Granulocytes: 1 %
Lymphocytes Relative: 27 %
Lymphs Abs: 2.1 10*3/uL (ref 0.7–4.0)
MCH: 22.2 pg — ABNORMAL LOW (ref 26.0–34.0)
MCHC: 28.4 g/dL — ABNORMAL LOW (ref 30.0–36.0)
MCV: 78.2 fL — ABNORMAL LOW (ref 80.0–100.0)
Monocytes Absolute: 0.8 10*3/uL (ref 0.1–1.0)
Monocytes Relative: 10 %
NEUTROS ABS: 4.7 10*3/uL (ref 1.7–7.7)
NEUTROS PCT: 60 %
Platelets: 231 10*3/uL (ref 150–400)
RBC: 4.68 MIL/uL (ref 3.87–5.11)
RDW: 15 % (ref 11.5–15.5)
WBC: 7.8 10*3/uL (ref 4.0–10.5)
nRBC: 0 % (ref 0.0–0.2)

## 2018-04-26 LAB — LACTATE DEHYDROGENASE: LDH: 190 U/L (ref 98–192)

## 2018-04-26 LAB — FERRITIN: Ferritin: 509 ng/mL — ABNORMAL HIGH (ref 11–307)

## 2018-04-29 ENCOUNTER — Other Ambulatory Visit: Payer: Self-pay

## 2018-04-29 DIAGNOSIS — I110 Hypertensive heart disease with heart failure: Secondary | ICD-10-CM | POA: Diagnosis not present

## 2018-04-29 DIAGNOSIS — Z794 Long term (current) use of insulin: Secondary | ICD-10-CM | POA: Diagnosis not present

## 2018-04-29 DIAGNOSIS — F250 Schizoaffective disorder, bipolar type: Secondary | ICD-10-CM | POA: Diagnosis not present

## 2018-04-29 DIAGNOSIS — I69354 Hemiplegia and hemiparesis following cerebral infarction affecting left non-dominant side: Secondary | ICD-10-CM | POA: Diagnosis not present

## 2018-04-29 DIAGNOSIS — F17210 Nicotine dependence, cigarettes, uncomplicated: Secondary | ICD-10-CM | POA: Diagnosis not present

## 2018-04-29 DIAGNOSIS — J449 Chronic obstructive pulmonary disease, unspecified: Secondary | ICD-10-CM | POA: Diagnosis not present

## 2018-04-29 DIAGNOSIS — I509 Heart failure, unspecified: Secondary | ICD-10-CM | POA: Diagnosis not present

## 2018-04-29 DIAGNOSIS — E1142 Type 2 diabetes mellitus with diabetic polyneuropathy: Secondary | ICD-10-CM | POA: Diagnosis not present

## 2018-04-29 DIAGNOSIS — Z79899 Other long term (current) drug therapy: Secondary | ICD-10-CM | POA: Diagnosis not present

## 2018-04-29 NOTE — Patient Outreach (Signed)
[facility]) Care Management  04/29/2018   [name] 06-14-1950 [id number]  Subjective: Successful call to the patient.  HIPAA verified.  The patient answered the phone and seemed very groggy and slurred speech.  She was able to answer short response questions but if they involved long conversation she would get quite like she had fallen asleep.  She states that she had not woken up yet.  She was able to tell me her medications and what she had taken this morning. She states that she had only taken her blood pressure medication.  Her blood pressure she states was 144/67 and blood sugar was 152.  She stated that she is still monitoring the salt in her diet.  While on the phone the doorbell rang and her nurse from [facility] arrived to fill her med box.  I spoke with [name] RN with [facility] and expressed my concerns and she states that the patient is often like this when she visits her in the mornings.  She states that she will assess her.  The patient's next appointment with her primary care will be 06/06/18.   Current Medications:  Current Outpatient Medications  Medication Sig Dispense Refill  . ACCU-CHEK FASTCLIX LANCETS MISC Use to check blood sugar 4 times a day 306 each 0  . albuterol (PROVENTIL HFA;VENTOLIN HFA) 108 (90 Base) MCG/ACT inhaler Inhale 1-2 puffs every 6 hours as needed for wheezing, shortness of breath 3 Inhaler 3  . amLODipine (NORVASC) 10 MG tablet Take 1 tablet (10 mg total) by mouth daily. 90 tablet 3  . ascorbic acid (VITAMIN C) 500 MG tablet Take 500 mg by mouth daily.    [name] aspirin EC 81 MG tablet Take 81 mg by mouth daily.    . Blood Glucose Monitoring Suppl (ACCU-CHEK AVIVA) device Use as instructed 1 each 0  . cetirizine (ZYRTEC) 10 MG tablet Take 1 tablet (10 mg total) by mouth daily. 7 tablet 0  . Cholecalciferol (VITAMIN D3) 5000 units CAPS Take 1 capsule by mouth daily.    . cloNIDine (CATAPRES) 0.1 MG tablet Take 1 tablet (0.1 mg total) by mouth 2  (two) times daily. Take 0.1 mg for 3 days and then stop 3 tablet 0  . diclofenac sodium (VOLTAREN) 1 % GEL Apply 2 g topically 4 (four) times daily. 1 Tube 3  . Dimethicone-Zinc Oxide 1.1-40 % OINT Apply 1 application topically 2 (two) times daily for 21 days. 1 Tube 1  . docusate sodium (COLACE) 100 MG capsule Take 100 mg by mouth 2 (two) times daily.    . DROPLET PEN NEEDLES 32G X 4 MM MISC USE  TO INJECT FOUR TIMES DAILY 400 each 0  . glucose blood (ACCU-CHEK AVIVA) test strip Use to check blood sugar 4 times a day 400 each 2  . hydroxychloroquine (PLAQUENIL) 200 MG tablet Take 200 mg by mouth daily.     . hydrOXYzine (ATARAX/VISTARIL) 10 MG tablet Take 1 tablet (10 mg total) by mouth 3 (three) times daily as needed. 30 tablet 0  . insulin aspart (NOVOLOG FLEXPEN) 100 UNIT/ML FlexPen INJECT 10 TO 14 UNITS INTO THE SKIN 3 (THREE) TIMES DAILY WITH MEALS. 45 mL 3  . Insulin Glargine (TOUJEO SOLOSTAR) 300 UNIT/ML SOPN Inject 20 Units into the skin at bedtime. 9 pen 3  . lamoTRIgine (LAMICTAL) 100 MG tablet Take 1 tablet (100 mg total) by mouth 2 (two) times daily. 180 tablet 2  . levothyroxine (SYNTHROID, LEVOTHROID) 50 MCG tablet TAKE  1 TABLET  DAILY  EXCEPT TAKE 1/2 TABLET  ON  SUNDAY 85 tablet 1  . LORazepam (ATIVAN) 0.5 MG tablet Take 1 tablet (0.5 mg total) by mouth 2 (two) times daily. 180 tablet 2  . losartan (COZAAR) 50 MG tablet Take 1 tablet (50 mg total) by mouth daily. 90 tablet 1  . metoprolol tartrate (LOPRESSOR) 50 MG tablet TAKE 1 TABLET TWICE DAILY (NEED MD APPOINTMENT) 180 tablet 2  . montelukast (SINGULAIR) 10 MG tablet TAKE 1 TABLET EVERY DAY 90 tablet 1  . Multiple Vitamins-Minerals (HM MULTIVITAMIN ADULT GUMMY PO) Take 1 tablet by mouth daily.    . Nicotine (NICOTROL NS) 10 MG/ML SOLN Use 1 spray per nostril up to eight times per day as needed 10 mL 0  . nystatin (MYCOSTATIN/NYSTOP) powder APPLY TO AFFECTED AREA 4 TIMES DAILY. 30 g 2  . ondansetron (ZOFRAN) 4 MG tablet Take 1  tablet by mouth every 6 hours as needed for nausea. 50 tablet 1  . oxyCODONE-acetaminophen (PERCOCET/ROXICET) 5-325 MG tablet Take 1 tablet by mouth every 6 (six) hours as needed.     . pregabalin (LYRICA) 75 MG capsule Take 1 capsule (75 mg total) by mouth daily.    . promethazine-dextromethorphan (PROMETHAZINE-DM) 6.25-15 MG/5ML syrup Take one teaspoon at bedtime for excessive cough, as needed 118 mL 0  . RABEprazole (ACIPHEX) 20 MG tablet Take 1 tablet (20 mg total) by mouth 2 (two) times daily. 180 tablet 3  . RESTASIS 0.05 % ophthalmic emulsion Place 1 drop into both eyes 2 (two) times daily.     . risperiDONE (RISPERDAL) 0.5 MG tablet Take 1 tablet (0.5 mg total) by mouth at bedtime. 90 tablet 2  . rosuvastatin (CRESTOR) 5 MG tablet TAKE 1 TABLET AT BEDTIME 90 tablet 1  . sertraline (ZOLOFT) 100 MG tablet Take 1 tablet (100 mg total) by mouth daily. 90 tablet 2  . traZODone (DESYREL) 150 MG tablet Take 0.5 tablets (75 mg total) by mouth at bedtime. 135 tablet 2  . triamcinolone ointment (KENALOG) 0.1 % Apply 1 application topically at bedtime. Apply to elbows at bedtime.    [name] UNABLE TO FIND Transport Chair x 1  Dx-bilateral leg weakness, frequent falls 1 each 0  . UNABLE TO FIND Rolling walker with seat x 1  DX. Bilateral lower extremity weakness, frequent falls, instability 1 each 0   No current facility-administered medications for this visit.     Functional Status:  In your present state of health, do you have any difficulty performing the following activities: 01/31/2018 10/30/2017  Hearing? [place]? Y -  Difficulty concentrating or making decisions? Y -  Walking or climbing stairs? Y -  Dressing or bathing? Y -  Doing errands, shopping? Y N  Preparing Food and eating ? Y -  Using the Toilet? N -  In the past six months, have you accidently leaked urine? Y -  Do you have problems with loss of bowel control? N -  Managing your Medications? Y -  Managing your Finances? Y -   Housekeeping or managing your Housekeeping? Y -  Some recent data might be hidden    Fall/Depression Screening: Fall Risk  04/17/2018 04/09/2018 02/25/2018  Falls in the past year? 0 1 0  Number falls in past yr: - 1 -  Comment - - -  Injury with Fall? 0 1 -  Comment - - -  Risk Factor Category  - - -  Risk for fall due  to : - History of fall(s);Impaired balance/gait;Impaired mobility;Mental status change -  Follow up - - -   PHQ 2/9 Scores 04/17/2018 01/31/2018 11/20/2017 10/08/2017 09/18/2017 08/08/2017 06/29/2017  PHQ - 2 Score 0 1 3 3 4 2 1   PHQ- 9 Score - - 6 16 17 5  -    Assessment: Patient will continue to benefit from health coach outreach for disease management and support.  THN CM Care Plan Problem One     Most Recent Value  THN Long Term Goal   In 60 days the patient will verbalize maintaining her blood pressure and no hospital visits  THN Long Term Goal Start Date  04/29/18  Interventions for Problem One Long Term Goal  Talked with the patient about diet and medication adherence.  Spoke with [name] the [employer] nurse about the patient's medications and how she takes them.  The nurse states that the patient is groggy in the mornigs when she comes this was her normal but she would assess her whil in the home.     Plan: RN Health Coach will contact patient in the month of  March and patient agrees to next outreach. RN Health coach will send update the primary care.  [name] RN, BSN, [place] Direct Dial:  [phone]  Fax: [phone]

## 2018-04-30 DIAGNOSIS — R6889 Other general symptoms and signs: Secondary | ICD-10-CM | POA: Diagnosis not present

## 2018-04-30 DIAGNOSIS — E119 Type 2 diabetes mellitus without complications: Secondary | ICD-10-CM | POA: Diagnosis not present

## 2018-04-30 LAB — HM DIABETES EYE EXAM

## 2018-05-01 DIAGNOSIS — Z0100 Encounter for examination of eyes and vision without abnormal findings: Secondary | ICD-10-CM | POA: Diagnosis not present

## 2018-05-02 DIAGNOSIS — M6281 Muscle weakness (generalized): Secondary | ICD-10-CM | POA: Diagnosis not present

## 2018-05-02 DIAGNOSIS — I69359 Hemiplegia and hemiparesis following cerebral infarction affecting unspecified side: Secondary | ICD-10-CM | POA: Diagnosis not present

## 2018-05-02 DIAGNOSIS — E119 Type 2 diabetes mellitus without complications: Secondary | ICD-10-CM | POA: Diagnosis not present

## 2018-05-02 DIAGNOSIS — Z9181 History of falling: Secondary | ICD-10-CM | POA: Diagnosis not present

## 2018-05-03 ENCOUNTER — Other Ambulatory Visit: Payer: Self-pay

## 2018-05-03 ENCOUNTER — Inpatient Hospital Stay (HOSPITAL_BASED_OUTPATIENT_CLINIC_OR_DEPARTMENT_OTHER): Payer: Medicare HMO | Admitting: Internal Medicine

## 2018-05-03 ENCOUNTER — Encounter (HOSPITAL_COMMUNITY): Payer: Self-pay | Admitting: Internal Medicine

## 2018-05-03 VITALS — BP 105/34 | HR 62 | Temp 98.4°F | Resp 20 | Wt 168.0 lb

## 2018-05-03 DIAGNOSIS — E1122 Type 2 diabetes mellitus with diabetic chronic kidney disease: Secondary | ICD-10-CM

## 2018-05-03 DIAGNOSIS — I10 Essential (primary) hypertension: Secondary | ICD-10-CM

## 2018-05-03 DIAGNOSIS — K219 Gastro-esophageal reflux disease without esophagitis: Secondary | ICD-10-CM

## 2018-05-03 DIAGNOSIS — E042 Nontoxic multinodular goiter: Secondary | ICD-10-CM

## 2018-05-03 DIAGNOSIS — Z800 Family history of malignant neoplasm of digestive organs: Secondary | ICD-10-CM

## 2018-05-03 DIAGNOSIS — E785 Hyperlipidemia, unspecified: Secondary | ICD-10-CM

## 2018-05-03 DIAGNOSIS — D500 Iron deficiency anemia secondary to blood loss (chronic): Secondary | ICD-10-CM

## 2018-05-03 DIAGNOSIS — D320 Benign neoplasm of cerebral meninges: Secondary | ICD-10-CM

## 2018-05-03 DIAGNOSIS — Z803 Family history of malignant neoplasm of breast: Secondary | ICD-10-CM

## 2018-05-03 DIAGNOSIS — E1143 Type 2 diabetes mellitus with diabetic autonomic (poly)neuropathy: Secondary | ICD-10-CM

## 2018-05-03 DIAGNOSIS — K5900 Constipation, unspecified: Secondary | ICD-10-CM | POA: Diagnosis not present

## 2018-05-03 DIAGNOSIS — Z86010 Personal history of colonic polyps: Secondary | ICD-10-CM

## 2018-05-03 DIAGNOSIS — I272 Pulmonary hypertension, unspecified: Secondary | ICD-10-CM

## 2018-05-03 DIAGNOSIS — J9811 Atelectasis: Secondary | ICD-10-CM | POA: Diagnosis not present

## 2018-05-03 DIAGNOSIS — N189 Chronic kidney disease, unspecified: Secondary | ICD-10-CM

## 2018-05-03 DIAGNOSIS — I700 Atherosclerosis of aorta: Secondary | ICD-10-CM | POA: Diagnosis not present

## 2018-05-03 DIAGNOSIS — R5383 Other fatigue: Secondary | ICD-10-CM | POA: Diagnosis not present

## 2018-05-03 DIAGNOSIS — Z79899 Other long term (current) drug therapy: Secondary | ICD-10-CM

## 2018-05-03 DIAGNOSIS — Z7982 Long term (current) use of aspirin: Secondary | ICD-10-CM

## 2018-05-03 DIAGNOSIS — F17210 Nicotine dependence, cigarettes, uncomplicated: Secondary | ICD-10-CM

## 2018-05-03 DIAGNOSIS — F319 Bipolar disorder, unspecified: Secondary | ICD-10-CM

## 2018-05-03 DIAGNOSIS — G894 Chronic pain syndrome: Secondary | ICD-10-CM

## 2018-05-03 DIAGNOSIS — I129 Hypertensive chronic kidney disease with stage 1 through stage 4 chronic kidney disease, or unspecified chronic kidney disease: Secondary | ICD-10-CM | POA: Diagnosis not present

## 2018-05-03 DIAGNOSIS — R002 Palpitations: Secondary | ICD-10-CM

## 2018-05-03 DIAGNOSIS — M797 Fibromyalgia: Secondary | ICD-10-CM

## 2018-05-03 DIAGNOSIS — E039 Hypothyroidism, unspecified: Secondary | ICD-10-CM

## 2018-05-03 DIAGNOSIS — Z8673 Personal history of transient ischemic attack (TIA), and cerebral infarction without residual deficits: Secondary | ICD-10-CM

## 2018-05-03 NOTE — Progress Notes (Signed)
Diagnosis Iron deficiency anemia due to chronic blood loss - Plan: CBC with Differential, Comprehensive metabolic panel, Lactate dehydrogenase, Ferritin  Staging Cancer Staging No matching staging information was found for the patient.  Assessment and Plan:  1. Microcytic anemia.  Pt has combination anemia from iron deficiency and chronic kidney disease.  She had Colonoscopy on 02/10/2016 showing 2 sessile polyps in the descending colon, diverticuli in sigmoid colon, EGD on 02/10/2016 showing mild to moderate esophagitis, erythema in the inferior gastric antrum.  Pt was last treated with IV iron 03/2018.   Labs done 04/26/2018 reviewed and showed WBC 7.8 HB 10.4 plts 231,000.  Chemistries WNL with K+ 3.9 Cr 1 and normal LFTs.  Ferritin 509.  Hb stable and iron levels improved after IV iron.  Will repeat labs in 07/2018.  Pt had CT of the abdomen and pelvis done 06/28/2017 that showed  IMPRESSION: 1. No acute abnormality seen to explain the patient's symptoms. Small amount of stool noted in the colon, without CT evidence for constipation. 2. Mild diverticulosis along the descending and sigmoid colon, without evidence of diverticulitis.  Follow-up with GI as recommended.    2.  Smoking.  Cessation recommended.  She has > 30 pack yr history of smoking.  CT chest done 01/23/2018 reviewed and showed IMPRESSION: Scattered atherosclerotic calcifications in the aorta and coronary arteries.  Scattered atelectasis in both lungs without acute infiltrate.  No acute intrathoracic abnormalities.  Aortic Atherosclerosis (ICD10-I70.0).  Will consider repeat imaging in 1 year due to ongoing smoking history.    3.  CVA.  Pt has some neurological deficits.  Follow-up with PCP and neurology as directed.    4.  Reported brain tumor.  Review of chart shows MRI in 03/2017 and CT in 10/2017 showed evidence of meningioma.  Pt is unsure when her next appointment is with Dr. Sherwood Gambler.  Will contact office  for follow-up.    Interval History: Historical data obtained from note dated 08/10/2017.  68 y.o. female is seen by Dr. Worthy Keeler on 06/26/2017 for evaluation  and management of microcytic anemia.  Labs done 05/19/2017 showed hemoglobin was 8.6 and MCV over 72.  White count and platelet count were within normal limits.  She reports feeling very fatigued and has occasional lightheadedness.  Her last blood transfusion was in 2012.  She was treated with intravenous iron in the past.  She denies any bleeding per rectum but has occasional dark stools.  She has been placed on iron pills for the last few months.  She gets severely constipated when she takes them. Last EGD and colonoscopy were in November 2017.  She lives by herself at home and walks with the help of walker.  She has an aide during the daytime, helping her with cooking and other daily chores.  Labs done 06/26/2017 showed HB 9.9 and ferritin 14.  She was treated with IV iron on 07/05/2017 and 07/12/2017.    Current Status:  Pt is seen for follow-up after IV iron.  She is here to go over labs.  She is unsure when her follow-up with neurosurgery is.    Problem List Patient Active Problem List   Diagnosis Date Noted  . Acute maxillary sinusitis [J01.00] 04/17/2018  . Lipoma of extremity [D17.79] 12/31/2017  . Impingement syndrome of left shoulder region [M75.42] 12/27/2017  . Weakness generalized [R53.1]   . Leg weakness, bilateral [R29.898] 10/28/2017  . Lumbar spondylosis with myelopathy [M47.16] 10/12/2017  . Numbness of hand [R20.0] 10/10/2017  .  Chronic pain syndrome [G89.4] 07/25/2017  . At risk for cardiovascular event [Z91.89] 06/10/2017  . Diabetic polyneuropathy associated with type 2 diabetes mellitus ([place]) [E11.42] 05/19/2016  . Tobacco user [Z72.0] 04/24/2016  . History of palpitations [Z87.898] 08/09/2015  . Nausea [R11.0] 08/09/2015  . Labile hypertension [R09.89] 08/03/2015  . Normal coronary arteries [Z03.89] 08/03/2015  .  Left-sided low back pain with left-sided sciatica [M54.42] 06/27/2015  . Type 2 diabetes mellitus with hyperglycemia, with long-term current use of insulin ([place]) [E11.65, Z79.4] 05/06/2015  . Multinodular goiter [E04.2] 05/06/2015  . Rectocele, female [N81.6] 04/27/2015  . Anal sphincter incontinence [R15.9] 04/27/2015  . Pelvic relaxation due to rectocele [N81.6] 03/30/2015  . Pulmonary hypertension ([place]) [I27.20] 02/22/2015  . Acute bronchitis with COPD ([place]) [J44.0, J20.9] 02/21/2015  . Light cigarette smoker (1-9 cigarettes per day) [F17.210] 01/11/2015  . Migraine without aura and without status migrainosus, not intractable [G43.009] 07/02/2014  . Flatulence [R14.3] 02/18/2014  . Microcytic anemia [D50.9] 02/18/2014  . COPD mixed type ([place]) [J44.9] 09/16/2013  . Hypothyroidism [E03.9] 08/16/2013  . Gastroparesis [K31.84] 04/28/2013  . Seizure disorder ([place]) [T03.546] 01/19/2013  . Cervical disc disorder with radiculopathy of cervical region [M50.10] 10/31/2012  . Solitary pulmonary nodule [R91.1] 08/19/2012  . Anemia [D64.9] 07/05/2012  . Acute cystitis without hematuria [N30.00] 06/14/2012  . Hypersomnia disorder related to a known organic factor [G47.10] 06/11/2012  . Allergic sinusitis [J30.9] 04/18/2012  . Meningioma ([place]) [D32.9] 11/19/2011  . Mononeuritis leg [G57.90] 10/25/2011  . Carpal tunnel syndrome of right wrist [G56.01] 05/23/2011  . Polypharmacy [Z79.899] 04/28/2011  . Bipolar disorder ([place]) [F31.9] 04/28/2011  . Constipation [K59.00] 04/13/2011  . Falls frequently [R29.6] 12/12/2010  . Oropharyngeal dysphagia [R13.12] 07/12/2010  . Urinary incontinence [R32] 12/16/2009  . HEARING LOSS [H91.90] 10/26/2009  . Hyperlipidemia [E78.5] 12/11/2008  . IBS [K58.9] 12/11/2008  . GERD [K21.9] 07/29/2008  . MILK PRODUCTS ALLERGY [Z91.011] 07/29/2008  . 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 Essential hypertension [I10]  06/27/2007  . Backache [M54.9] 06/19/2007  . Osteoporosis [M81.0] 06/19/2007  . Obstructive sleep apnea [G47.33] 06/19/2007  . TRIGGER FINGER [M71.50] 04/18/2007  . DIVERTICULOSIS, COLON [K57.30] 11/13/2006    Past Medical History Past Medical History:  Diagnosis Date  . Allergy   . Anemia   . Anxiety    takes Ativan daily  . Arthritis   . Assistance needed for mobility   . Bipolar disorder ([place])    takes Risperdal nightly  . Blood transfusion   . Cancer ([place])    In her gum  . Carpal tunnel syndrome of right wrist 05/23/2011  . Cervical disc disorder with radiculopathy of cervical region 10/31/2012  . Chronic back pain   . Chronic idiopathic constipation   . Chronic neck and back pain   . Colon polyps   . COPD (chronic obstructive pulmonary disease) with chronic bronchitis ([place]) 09/16/2013   Office Spirometry 10/30/2013-submaximal effort based on appearance of loop and curve. Numbers would fit with severe restriction but her physiologic capability may be better than this. FVC 0.91/44%, and 10.74/45%, FEV1/FVC 0.81, FEF 25-75% 1.43/69%    . Depression    takes Zoloft daily  . Diabetes mellitus    Type II  . Diverticulosis    TCS 9/08 by Dr. Delfin Edis for diarrhea . Bx for micro scopic colitis negative.   . Fibromyalgia   . Frequent falls   . GERD (gastroesophageal reflux disease)    takes Aciphex daily  .  Glaucoma    eye drops daily  . Gum symptoms    infection on antibiotic  . Hemiplegia affecting non-dominant side, post-stroke 08/02/2011  . Hyperlipidemia    takes Crestor daily  . Hypertension    takes Amlodipine,Metoprolol,and Clonidine daily  . Hypothyroidism    takes Synthroid daily  . IBS (irritable bowel syndrome)   . Insomnia    takes Trazodone nightly  . 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 Metabolic encephalopathy 3/50/0938  . Migraines    chronic headaches  . Mononeuritis lower limb   . Narcolepsy   . Osteoporosis   . Pancreatitis 2006   due to  Depakote with normal EUS   . Schatzki's ring    non critical / EGD with ED 8/2011with RMR  . Seizures ([place])    takes Lamictal daily.Last seizure 3 yrs ago  . Sleep apnea    on CPAP  . Stroke [facility])    left sided weakness, speech changes  . Tubular adenoma of colon     Past Surgical History Past Surgical History:  Procedure Laterality Date  . ABDOMINAL HYSTERECTOMY  1978  . BACK SURGERY  July 2012  . BACTERIAL OVERGROWTH TEST N/A 05/05/2013   Procedure: BACTERIAL OVERGROWTH TEST;  Surgeon: Daneil Dolin, MD;  [facility];  Service: Endoscopy;  Laterality: N/A;  7:30  . BIOPSY THYROID  2009  . BRAIN SURGERY  11/2011   resection of meningioma  . BREAST REDUCTION SURGERY  1994  . CARDIAC CATHETERIZATION  05/10/2005   normal coronaries, normal LV systolic function and EF (Dr. Jackie Plum)  . CARPAL TUNNEL RELEASE Left 07/22/04   Dr. Aline Brochure  . CATARACT EXTRACTION Bilateral   . CHOLECYSTECTOMY  1984  . COLONOSCOPY N/A 09/25/2012   HWE:XHBZJIR diverticulosis.  colonic polyp-removed : tubular adenoma  . CRANIOTOMY  11/23/2011   Procedure: CRANIOTOMY TUMOR EXCISION;  Surgeon: Hosie Spangle, MD;  Location: [place] [facility] OR[facility];  Service: Neurosurgery;  Laterality: N/A;  Craniotomy for tumor resection  . ESOPHAGOGASTRODUODENOSCOPY  12/29/2010   [place]-Retained food in the esophagus and stomach, small hiatal hernia, status post Maloney dilation of the esophagus  . ESOPHAGOGASTRODUODENOSCOPY N/A 09/25/2012   CVE:LFYBOFBP atonic baggy esophagus status post Maloney dilation 67 F. Hiatal hernia  . GIVENS CAPSULE STUDY N/A 01/15/2013   NORMAL.   . IR GENERIC HISTORICAL  03/17/2016   IR RADIOLOGIST EVAL & MGMT 03/17/2016 [facility]-INTER[facility] RAD  . LESION REMOVAL N/A 05/31/2015   Procedure: REMOVAL RIGHT AND LEFT LESIONS OF MANDIBLE;  Surgeon: Diona Browner, DDS;  Location: [facility];  Service: Oral Surgery;  Laterality: N/A;  . MALONEY DILATION  12/29/2010   RMR;  . NM MYOCAR PERF WALL MOTION  2006    "relavtiely normal" persantine, mild anterior thinning (breast attenuation artifact), no region of scar/ischemia  . OVARIAN CYST REMOVAL    . RECTOCELE REPAIR N/A 06/29/2015   Procedure: POSTERIOR REPAIR (RECTOCELE);  Surgeon: Jonnie Kind, MD;  Location: [facility];  Service: Gynecology;  Laterality: N/A;  . REDUCTION MAMMAPLASTY Bilateral   . SPINE SURGERY  09/29/2010   Dr. Rolena Infante  . surgical excision of 3 tumors from right thigh and right buttock  and left upper thigh  2010  . TOOTH EXTRACTION Bilateral 12/14/2014   Procedure: REMOVAL OF BILATERAL MANDIBULAR EXOSTOSES;  Surgeon: Diona Browner, DDS;  Location: [facility];  Service: Oral Surgery;  Laterality: Bilateral;  . TRANSTHORACIC ECHOCARDIOGRAM  2010   EF 60-65%, mild conc LVH, grade 1 diastolic dysfunction; mildly calcified  MV annulus with mildly thickened leaflets, mildly calcified MR annulus    Family History Family History  Problem Relation Age of Onset  . Heart attack Mother        HTN  . Pneumonia Father   . Kidney failure Father   . Diabetes Father   . Pancreatic cancer Sister   . Diabetes Brother   . Hypertension Brother   . Diabetes Brother   . Cancer Sister        breast   . Hypertension Son   . Sleep apnea Son   . Cancer Sister        pancreatic  . Stroke Maternal Grandmother   . Heart attack Maternal Grandfather   . Alcohol abuse Maternal Uncle   . Colon cancer Neg Hx   . Anesthesia problems Neg Hx   . Hypotension Neg Hx   . Malignant hyperthermia Neg Hx   . Pseudochol deficiency Neg Hx      Social History  reports that she has been smoking cigarettes. She has been smoking about 0.50 packs per day. She has never used smokeless tobacco. She reports that she does not drink alcohol or use drugs.  Medications  Current Outpatient Medications:  .  ACCU-CHEK FASTCLIX LANCETS MISC, Use to check blood sugar 4 times a day, Disp: 306 each, Rfl: 0 .  albuterol (PROVENTIL HFA;VENTOLIN HFA) 108 (90 Base) MCG/ACT inhaler,  Inhale 1-2 puffs every 6 hours as needed for wheezing, shortness of breath, Disp: 3 Inhaler, Rfl: 3 .  amLODipine (NORVASC) 10 MG tablet, Take 1 tablet (10 mg total) by mouth daily., Disp: 90 tablet, Rfl: 3 .  ascorbic acid (VITAMIN C) 500 MG tablet, Take 500 mg by mouth daily., Disp: , Rfl:  .  aspirin EC 81 MG tablet, Take 81 mg by mouth daily., Disp: , Rfl:  .  Blood Glucose Monitoring Suppl (ACCU-CHEK AVIVA) device, Use as instructed, Disp: 1 each, Rfl: 0 .  cetirizine (ZYRTEC) 10 MG tablet, Take 1 tablet (10 mg total) by mouth daily., Disp: 7 tablet, Rfl: 0 .  Cholecalciferol (VITAMIN D3) 5000 units CAPS, Take 1 capsule by mouth daily., Disp: , Rfl:  .  diclofenac sodium (VOLTAREN) 1 % GEL, Apply 2 g topically 4 (four) times daily., Disp: 1 Tube, Rfl: 3 .  docusate sodium (COLACE) 100 MG capsule, Take 100 mg by mouth 2 (two) times daily., Disp: , Rfl:  .  DROPLET PEN NEEDLES 32G X 4 MM MISC, USE  TO INJECT FOUR TIMES DAILY, Disp: 400 each, Rfl: 0 .  glucose blood (ACCU-CHEK AVIVA) test strip, Use to check blood sugar 4 times a day, Disp: 400 each, Rfl: 2 .  hydroxychloroquine (PLAQUENIL) 200 MG tablet, Take 200 mg by mouth daily. , Disp: , Rfl:  .  hydrOXYzine (ATARAX/VISTARIL) 10 MG tablet, Take 1 tablet (10 mg total) by mouth 3 (three) times daily as needed., Disp: 30 tablet, Rfl: 0 .  insulin aspart (NOVOLOG FLEXPEN) 100 UNIT/ML FlexPen, INJECT 10 TO 14 UNITS INTO THE SKIN 3 (THREE) TIMES DAILY WITH MEALS., Disp: 45 mL, Rfl: 3 .  Insulin Glargine (TOUJEO SOLOSTAR) 300 UNIT/ML SOPN, Inject 20 Units into the skin at bedtime., Disp: 9 pen, Rfl: 3 .  lamoTRIgine (LAMICTAL) 100 MG tablet, Take 1 tablet (100 mg total) by mouth 2 (two) times daily., Disp: 180 tablet, Rfl: 2 .  levothyroxine (SYNTHROID, LEVOTHROID) 50 MCG tablet, TAKE 1 TABLET  DAILY  EXCEPT TAKE 1/2 TABLET  ON  SUNDAY, Disp:  85 tablet, Rfl: 1 .  LORazepam (ATIVAN) 0.5 MG tablet, Take 1 tablet (0.5 mg total) by mouth 2 (two) times  daily., Disp: 180 tablet, Rfl: 2 .  losartan (COZAAR) 50 MG tablet, Take 1 tablet (50 mg total) by mouth daily., Disp: 90 tablet, Rfl: 1 .  metaxalone (SKELAXIN) 800 MG tablet, metaxalone 800 mg tablet  take 1 tablet by mouth 3 times daily as needed., Disp: , Rfl:  .  metoprolol tartrate (LOPRESSOR) 50 MG tablet, TAKE 1 TABLET TWICE DAILY (NEED MD APPOINTMENT), Disp: 180 tablet, Rfl: 2 .  Multiple Vitamins-Minerals (HM MULTIVITAMIN ADULT GUMMY PO), Take 1 tablet by mouth daily., Disp: , Rfl:  .  Nicotine (NICOTROL NS) 10 MG/ML SOLN, Use 1 spray per nostril up to eight times per day as needed, Disp: 10 mL, Rfl: 0 .  nystatin (MYCOSTATIN/NYSTOP) powder, APPLY TO AFFECTED AREA 4 TIMES DAILY., Disp: 30 g, Rfl: 2 .  ondansetron (ZOFRAN) 4 MG tablet, Take 1 tablet by mouth every 6 hours as needed for nausea., Disp: 50 tablet, Rfl: 1 .  oxyCODONE-acetaminophen (PERCOCET) 10-325 MG tablet, , Disp: , Rfl:  .  pregabalin (LYRICA) 75 MG capsule, Take 1 capsule (75 mg total) by mouth daily., Disp: , Rfl:  .  RABEprazole (ACIPHEX) 20 MG tablet, Take 1 tablet (20 mg total) by mouth 2 (two) times daily., Disp: 180 tablet, Rfl: 3 .  RESTASIS 0.05 % ophthalmic emulsion, Place 1 drop into both eyes 2 (two) times daily. , Disp: , Rfl:  .  risperiDONE (RISPERDAL) 0.5 MG tablet, Take 1 tablet (0.5 mg total) by mouth at bedtime., Disp: 90 tablet, Rfl: 2 .  rosuvastatin (CRESTOR) 5 MG tablet, TAKE 1 TABLET AT BEDTIME, Disp: 90 tablet, Rfl: 1 .  sertraline (ZOLOFT) 100 MG tablet, Take 1 tablet (100 mg total) by mouth daily., Disp: 90 tablet, Rfl: 2 .  traZODone (DESYREL) 150 MG tablet, Take 0.5 tablets (75 mg total) by mouth at bedtime., Disp: 135 tablet, Rfl: 2 .  UNABLE TO FIND, Transport Chair x 1  Dx-bilateral leg weakness, frequent falls, Disp: 1 each, Rfl: 0 .  UNABLE TO FIND, Rolling walker with seat x 1  DX. Bilateral lower extremity weakness, frequent falls, instability, Disp: 1 each, Rfl: 0 .  triamcinolone  ointment (KENALOG) 0.1 %, Apply 1 application topically at bedtime. Apply to elbows at bedtime., Disp: , Rfl:   Allergies Cephalexin; Iron; Milk-related compounds; Penicillins; and Phenazopyridine hcl  Review of Systems Review of Systems - Oncology ROS negative   Physical Exam  Vitals Wt Readings from Last 3 Encounters:  05/03/18 168 lb (76.2 kg)  04/17/18 163 lb (73.9 kg)  04/09/18 163 lb (73.9 kg)   Temp Readings from Last 3 Encounters:  05/03/18 98.4 F (36.9 C) (Oral)  04/09/18 98.9 F (37.2 C)  04/02/18 98.3 F (36.8 C) (Oral)   BP Readings from Last 3 Encounters:  05/03/18 (!) 105/34  04/17/18 140/68  04/09/18 122/60   Pulse Readings from Last 3 Encounters:  05/03/18 62  04/17/18 73  04/09/18 68   Constitutional: Well-developed, well-nourished, and in no distress.   HENT: Head: Normocephalic and atraumatic.  Mouth/Throat: No oropharyngeal exudate. Mucosa moist. Eyes: Pupils are equal, round, and reactive to light. Conjunctivae are normal. No scleral icterus.  Neck: Normal range of motion. Neck supple. No JVD present.  Cardiovascular: Normal rate, regular rhythm and normal heart sounds.  Exam reveals no gallop and no friction rub.   No murmur heard. Pulmonary/Chest: Effort  normal and breath sounds normal. No respiratory distress. No wheezes.No rales.  Abdominal: Soft. Bowel sounds are normal. No distension. There is no tenderness. There is no guarding.  Musculoskeletal: No edema or tenderness.  Lymphadenopathy: No cervical, axillary or supraclavicular adenopathy.  Neurological: Alert and oriented to person, place, and time. Evidence of prior stroke.   Skin: Skin is warm and dry. No rash noted. No erythema. No pallor.  Psychiatric: Affect and judgment normal.   Labs Abstract on 05/01/2018  Component Date Value Ref Range Status  . HM Diabetic Eye Exam 04/30/2018 Retinopathy* No Retinopathy Final     Pathology Orders Placed This Encounter  Procedures  .  CBC with Differential    Standing Status:   Future    Standing Expiration Date:   05/04/2019  . Comprehensive metabolic panel    Standing Status:   Future    Standing Expiration Date:   05/04/2019  . Lactate dehydrogenase    Standing Status:   Future    Standing Expiration Date:   05/04/2019  . Ferritin    Standing Status:   Future    Standing Expiration Date:   05/04/2019       Zoila Shutter MD

## 2018-05-05 IMAGING — CT CT ABD-PELV W/ CM
2 of 5 series · 15 of 46 positions shown, 17 images · IV contrast (Isovue)
Comparison: CT Abdomen and Pelvis 09/15/2016, 09/10/2016, and
earlier.

CLINICAL DATA: 66-year-old female with small bowel obstruction
earlier this month. Generalized abdominal pain and nausea. Diabetes.

EXAM:
CT ABDOMEN AND PELVIS WITH CONTRAST
TECHNIQUE: Multidetector CT imaging of the abdomen and pelvis was performed
using the standard protocol following bolus administration of
intravenous contrast.
CONTRAST:  100mL [id number] IOPAMIDOL ([id number]) INJECTION 61%

[Series 2: axial st · axial · 0.70mm/px · z∈[-648,-253]mm · 12 of 91 slices shown, 14 images]
[im 6/91  soft-tissue]
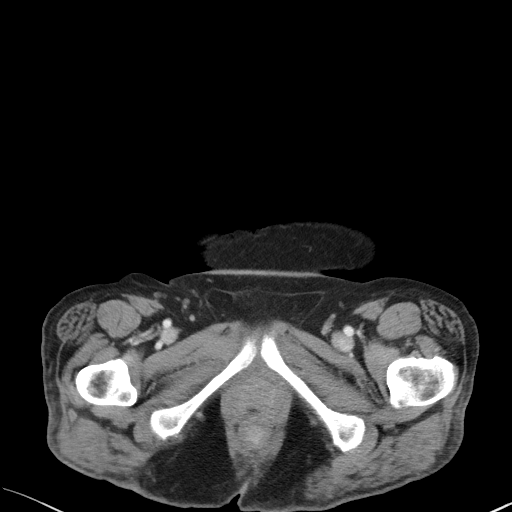
[im 6/91  bone]
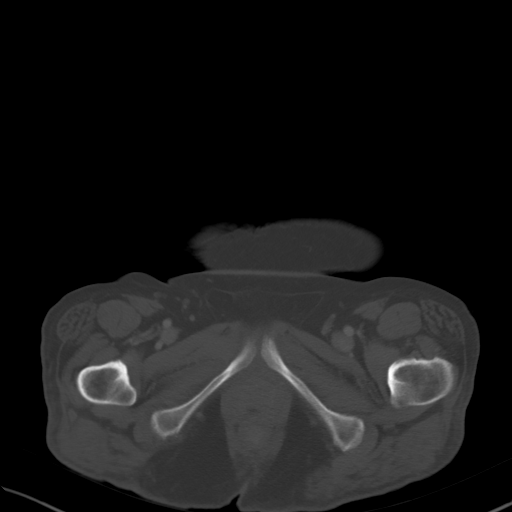
[im 16/91  soft-tissue]
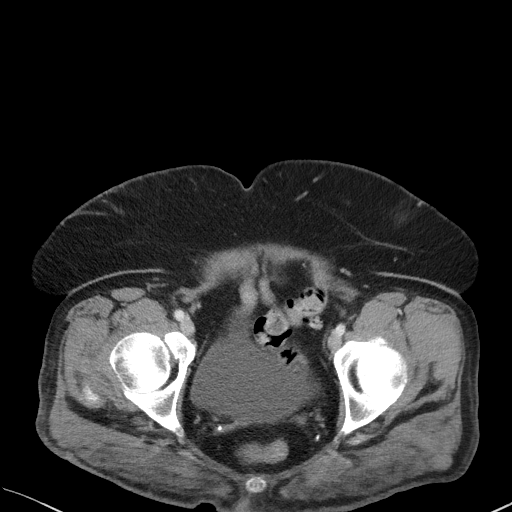
[im 22/91  soft-tissue]
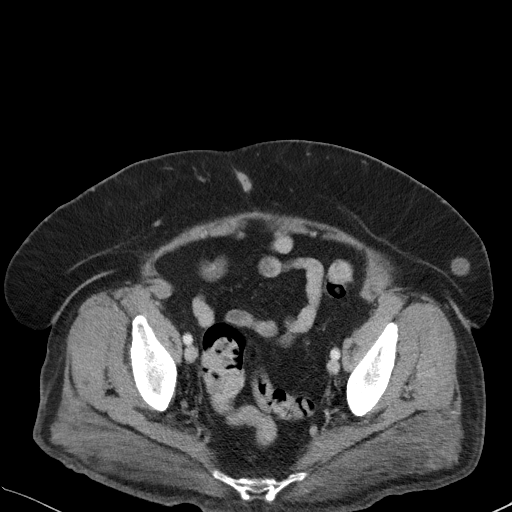
[im 27/91  soft-tissue]
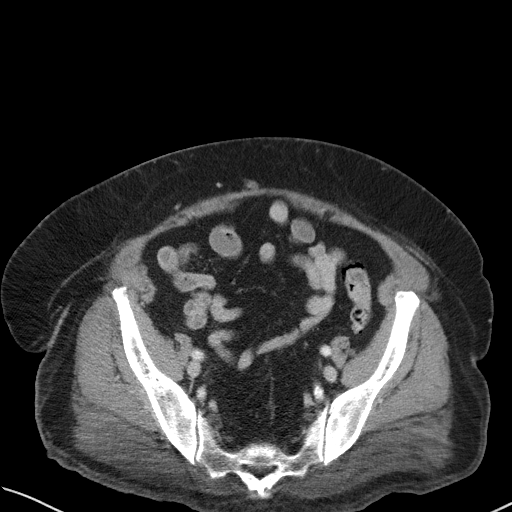
[im 38/91  soft-tissue]
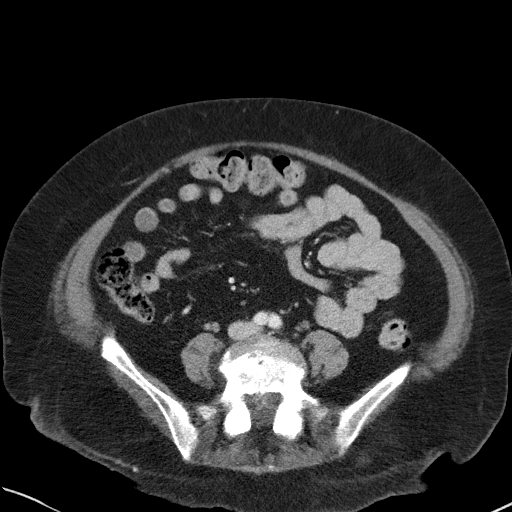
[im 43/91  soft-tissue]
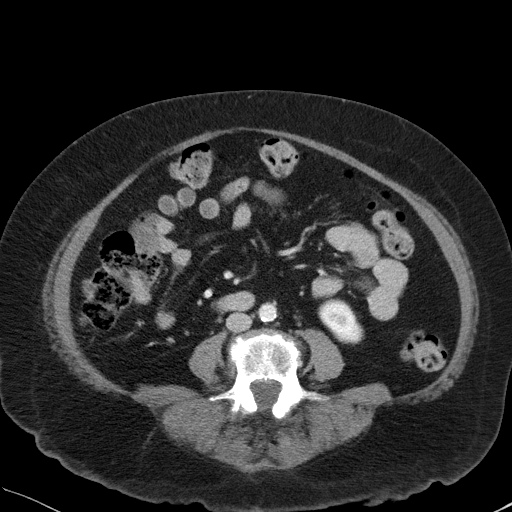
[im 48/91  soft-tissue]
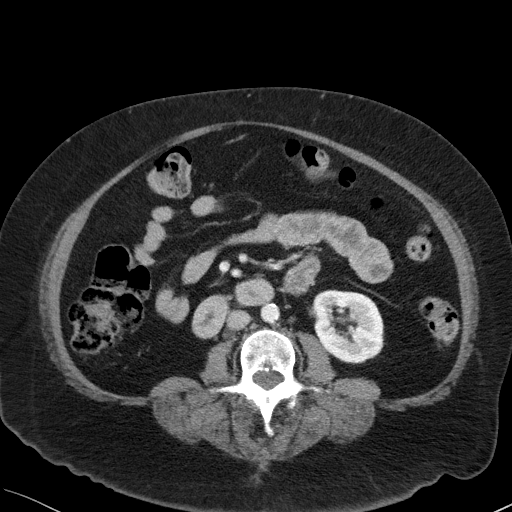
[im 59/91  soft-tissue]
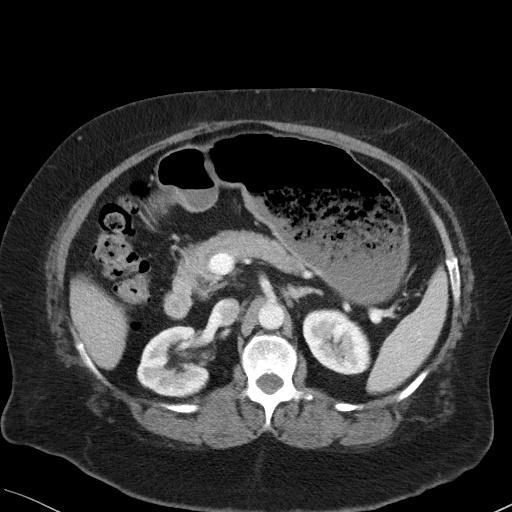
[im 64/91  soft-tissue]
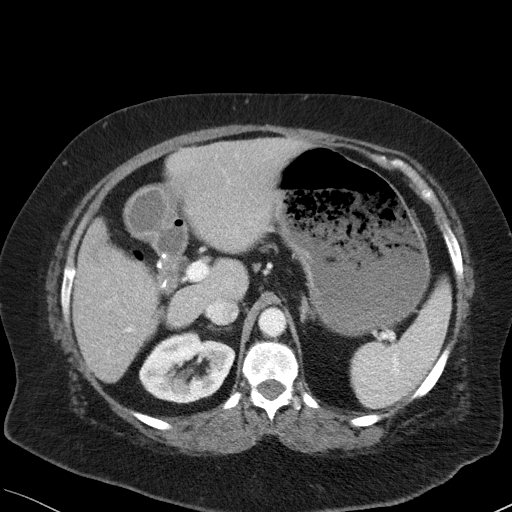
[im 64/91  bone]
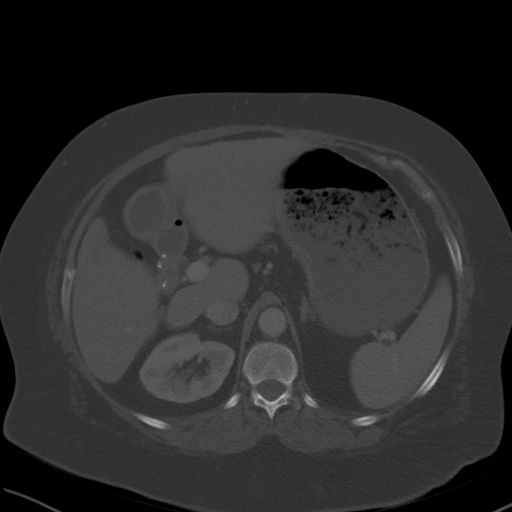
[im 69/91  soft-tissue]
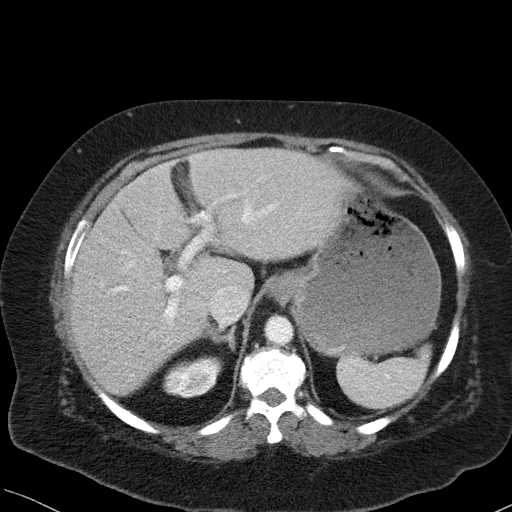
[im 80/91  soft-tissue]
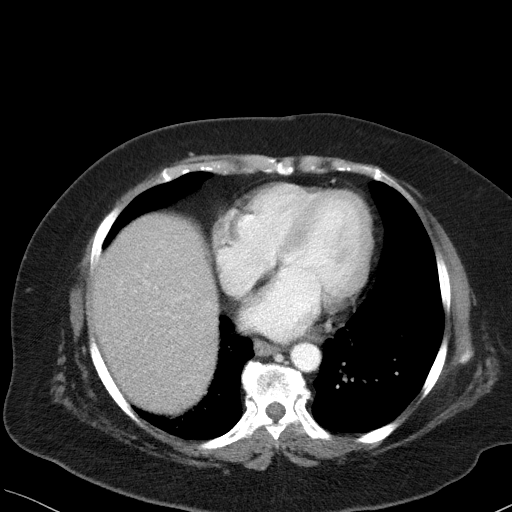
[im 85/91  soft-tissue]
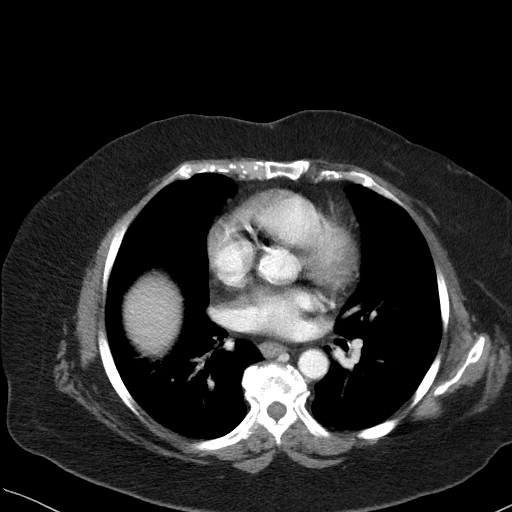

[Series 4: coronal st · coronal · 0.68mm/px · 3 of 99 slices shown]
[im 33/99  soft-tissue]
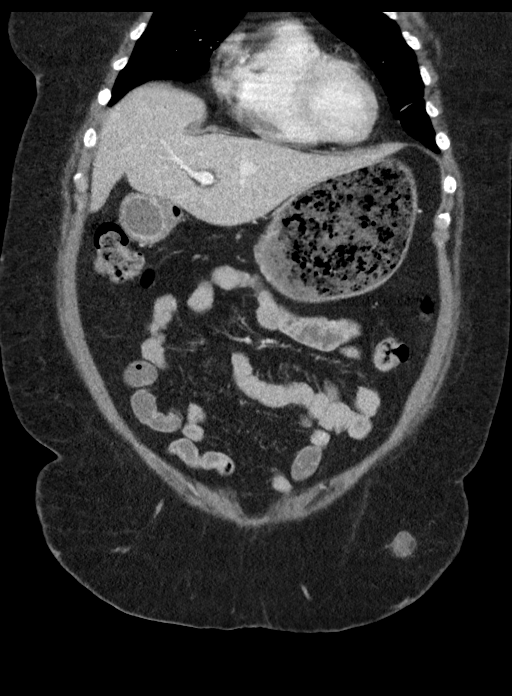
[im 44/99  soft-tissue]
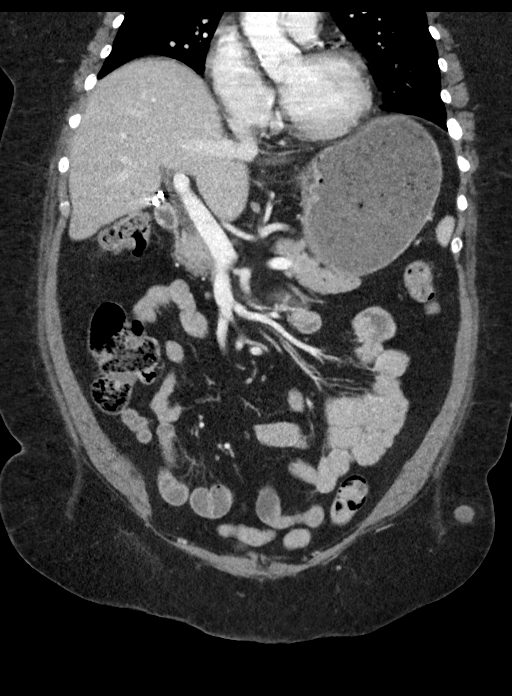
[im 55/99  soft-tissue]
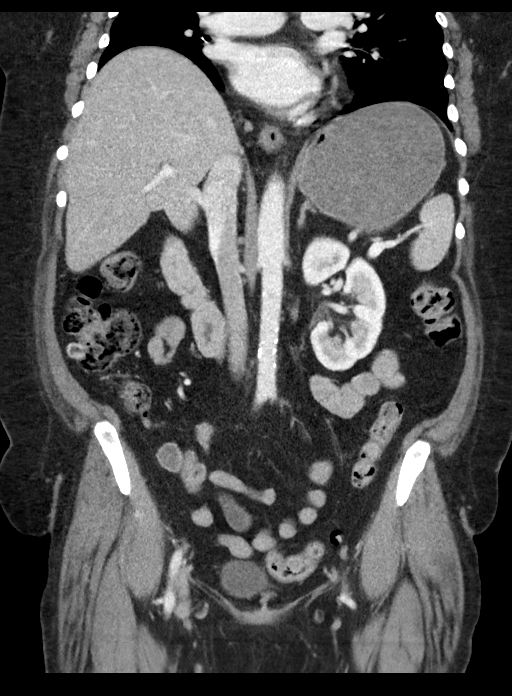

[15 of 46 positions shown; findings below may reference images not displayed]

FINDINGS: Lower chest: Stable and negative. No pericardial or pleural
effusion.

Hepatobiliary: Surgically absent gallbladder. Stable biliary ductal
enlargement. Otherwise negative liver.

Pancreas: Stable an negative.

Spleen: Stable an negative.

Adrenals/Urinary Tract: Normal adrenal glands.

Bilateral renal enhancement and contrast excretion is symmetric and
normal. Negative course of both ureters. Unremarkable urinary
bladder.

Stomach/Bowel: Decompressed rectum. Sigmoid colon with mild
diverticulosis, and retained stool. Similar diverticulosis and
retained stool in the left colon and transverse colon. Mild
diverticulosis also in the right colon. Normal appendix. Negative
terminal ileum.

No dilated or abnormal small bowel loops today. However, the stomach
is moderately distended with fluid and food debris. There is an
antral contraction of the stomach, but no obstructing etiology is
identified. However, the duodenum appears stable and within normal
limits. No abdominal free fluid. No free air.

Vascular/Lymphatic: Aortoiliac calcified atherosclerosis. Major
arterial structures in the abdomen and pelvis are patent. Portal
venous system is patent.

Reproductive: Surgically absent.

Other: No pelvic free fluid. Probable injection sites in the left
abdominal panniculus (such as series 2, image 74).

Musculoskeletal: Stable. Advanced lower lumbar spine degeneration
and postoperative changes to the lower lumbar posterior elements.
IMPRESSION: 1. Moderately distended stomach with fluid and food debris, such
that gastroparesis or gastric outlet obstruction are considerations,
but there is no dilated small bowel or other bowel obstruction
today.
2. Chronic findings including mild Calcified aortic atherosclerosis,
and mild diverticulosis of the colon.

## 2018-05-06 ENCOUNTER — Telehealth: Payer: Self-pay | Admitting: *Deleted

## 2018-05-06 NOTE — Telephone Encounter (Signed)
[name] called and stated that she had decided against back surgery. She stated she didn't feel like she was up for back surgery but didn't want to make things worse so she was requesting a lift chair. She said she spoke with [facility] and they told her they would look at her insurance but they needed a prescription for the chair. She said to let them know her back is in very bad condition.

## 2018-05-06 NOTE — Telephone Encounter (Signed)
Pt requesting rx for lift chair. If ok, I will order and will need to fax addended OV note with the order. Thanks

## 2018-05-08 ENCOUNTER — Other Ambulatory Visit: Payer: Self-pay | Admitting: Family Medicine

## 2018-05-10 IMAGING — CR DG CHEST 1V PORT
1 series · 1 of 1 positions shown · non-contrast
Comparison: Chest radiograph September 10, 2016

CLINICAL DATA: Code sepsis.  Life support line placement.

EXAM:
PORTABLE CHEST 1 VIEW

[ap portable]
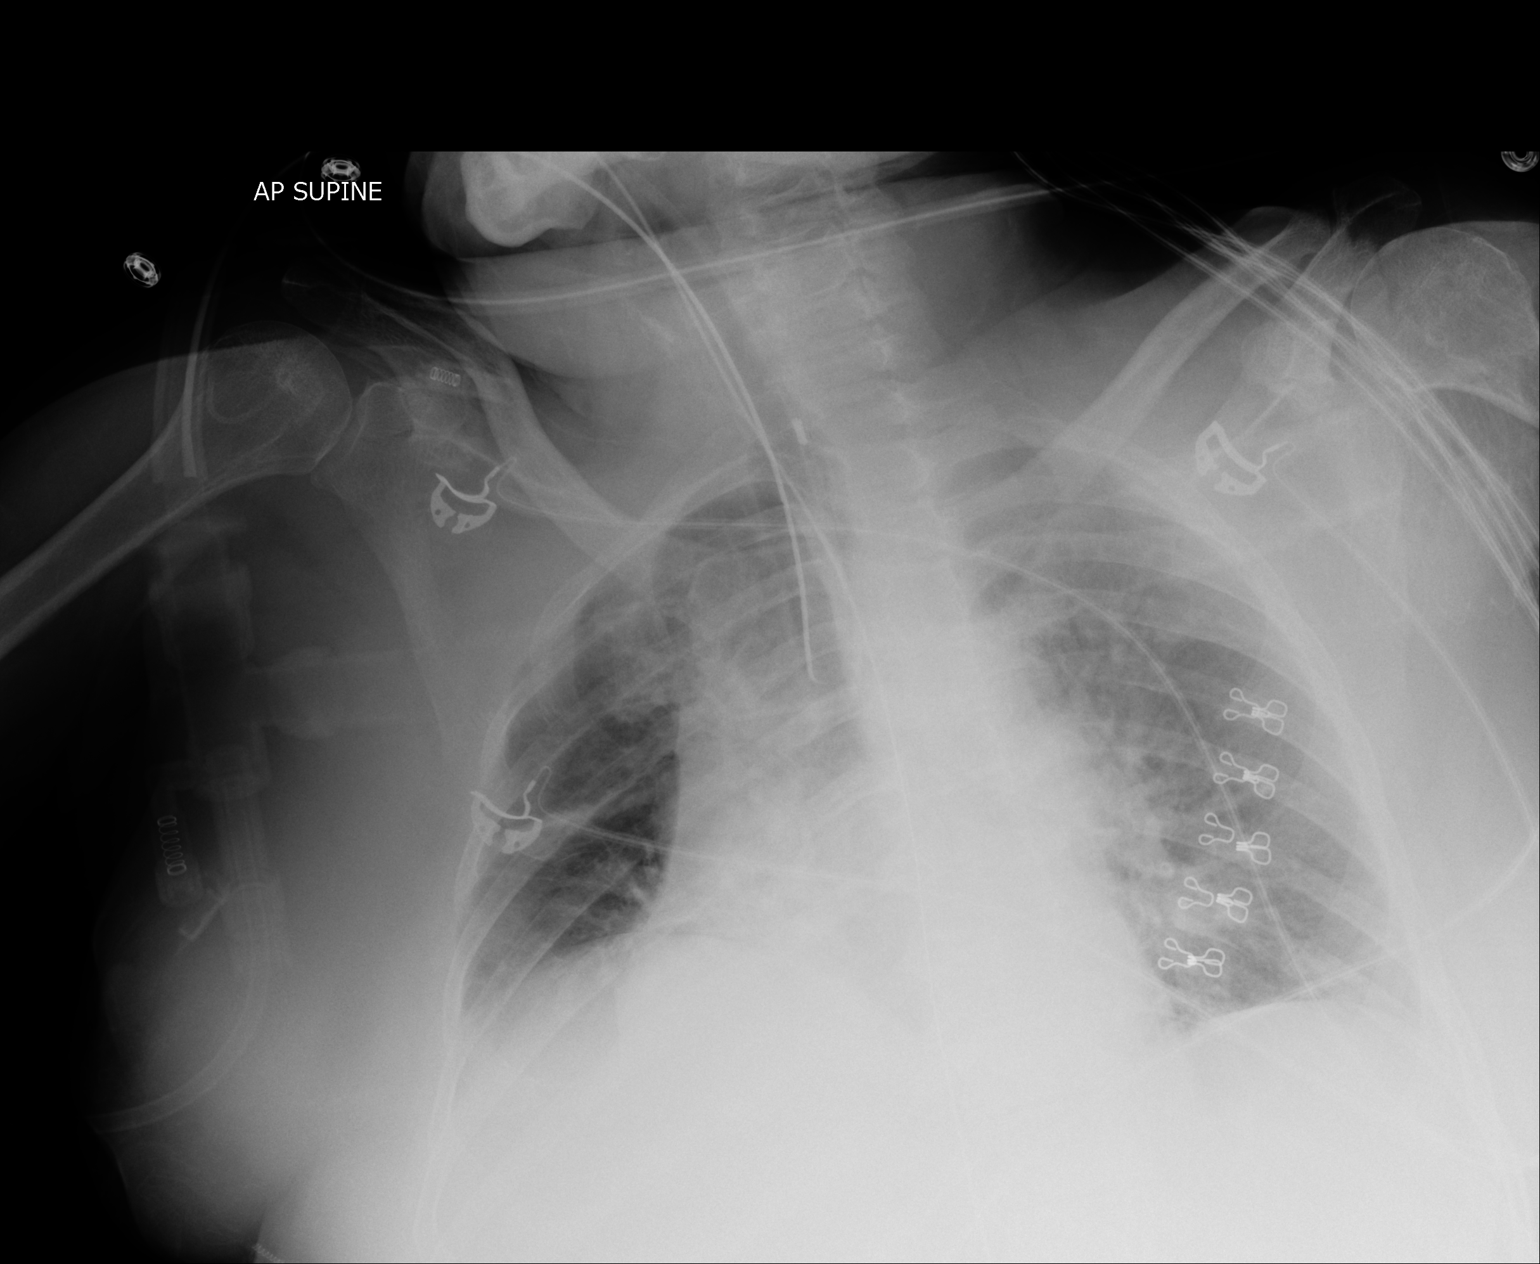

[1 of 1 positions shown; findings below may reference images not displayed]

FINDINGS: Endotracheal tube tip projects 2.2 cm above the carina. Nasogastric
tube past GE junction, distal tip not included. Patient rotated to
the RIGHT accentuating vascular markings. RIGHT lung base strandy
densities. Low inspiratory examination without pleural effusion or
focal consolidation. Stable RIGHT midlung zone scarring. No
pneumothorax. Soft tissue planes and included osseous structures are
unchanged.
IMPRESSION: Endotracheal tube tip projects 2.2 cm above the carina. Nasogastric
tube past GE junction, distal tip not included.

Low inspiratory examination of the RIGHT mid and lower lung zone
atelectasis/ scarring.

## 2018-05-10 IMAGING — CT CT HEAD W/O CM
3 series · 14 of 47 positions shown, 16 images · non-contrast
Comparison: 02/17/2016, 05/01/2015

CLINICAL DATA: Pt c/o upper back pain after fall 3 days ago. Pt
denies LOC upon fall. Pt's BP in triage 87/52. Pt falling asleep in
triage, but easily arouses. Pt reports she doesn't know why she's so
sleepy. Denies dizziness, weakness.

EXAM:
CT HEAD WITHOUT CONTRAST
TECHNIQUE: Contiguous axial images were obtained from the base of the skull
through the vertex without intravenous contrast.

[Series 2: head trauma wo · axial · 0.45mm/px · z∈[+278,+413]mm · 8 of 33 slices shown, 10 images]
[im 3/33  brain]
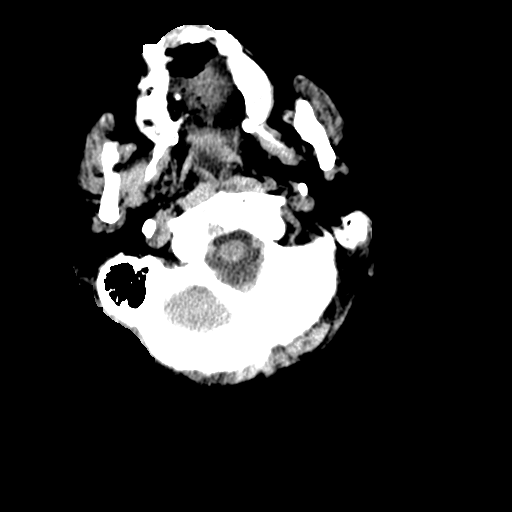
[im 3/33  bone]
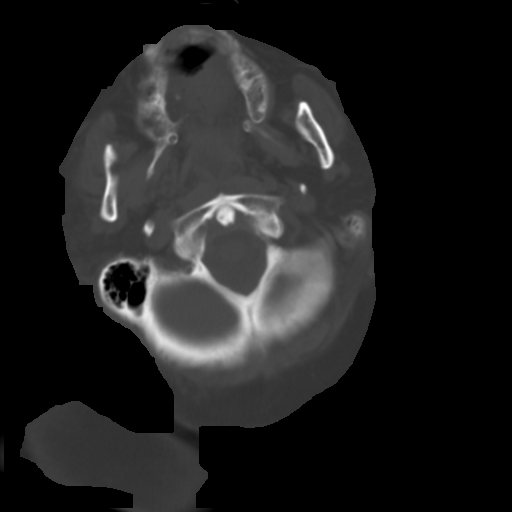
[im 7/33  brain]
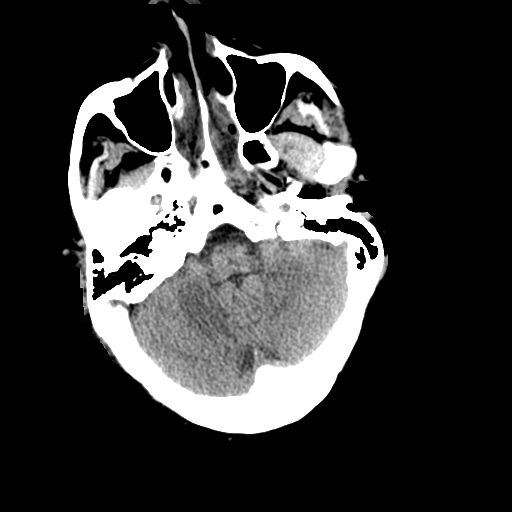
[im 10/33  brain]
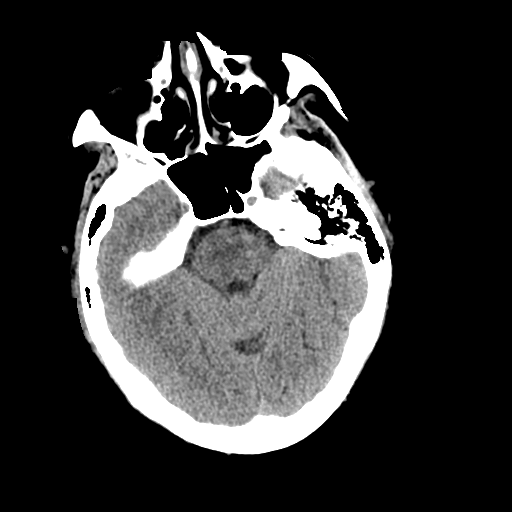
[im 15/33  brain]
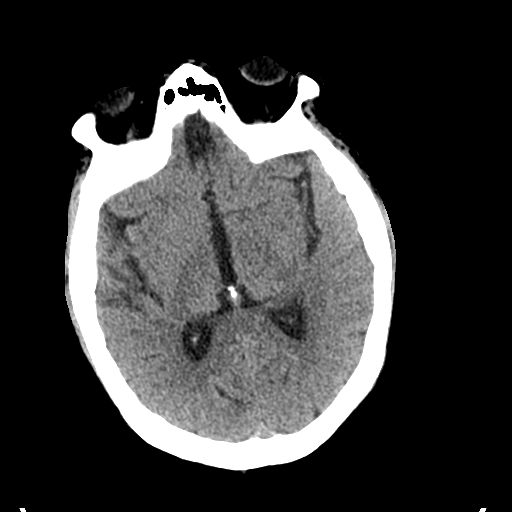
[im 18/33  brain]
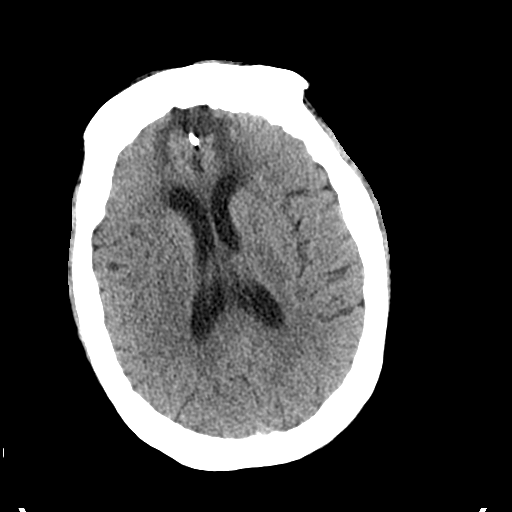
[im 18/33  bone]
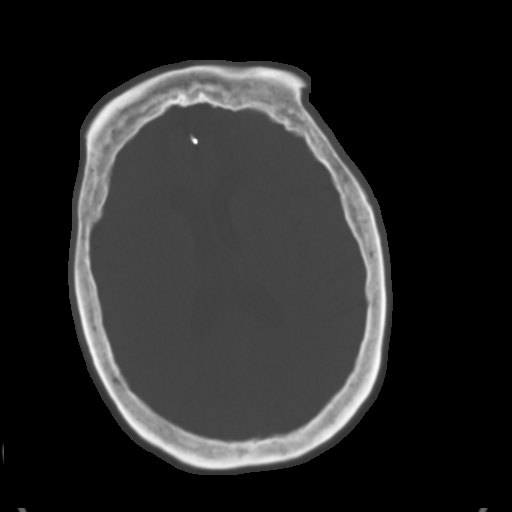
[im 23/33  brain]
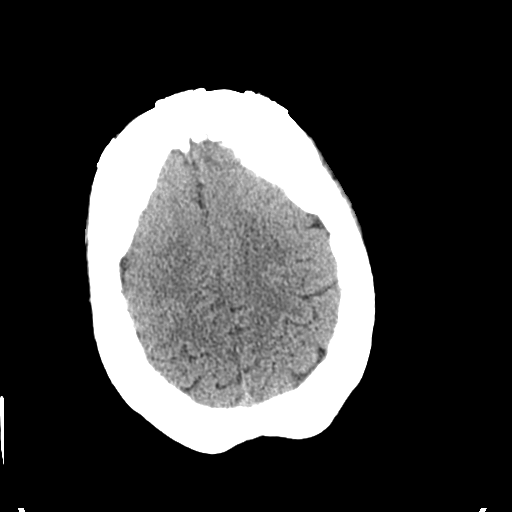
[im 26/33  brain]
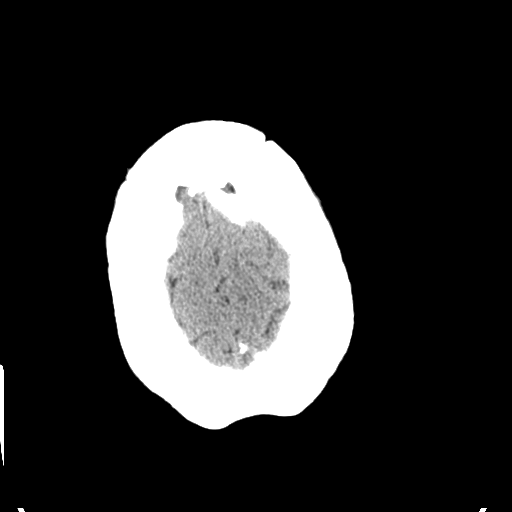
[im 30/33  brain]
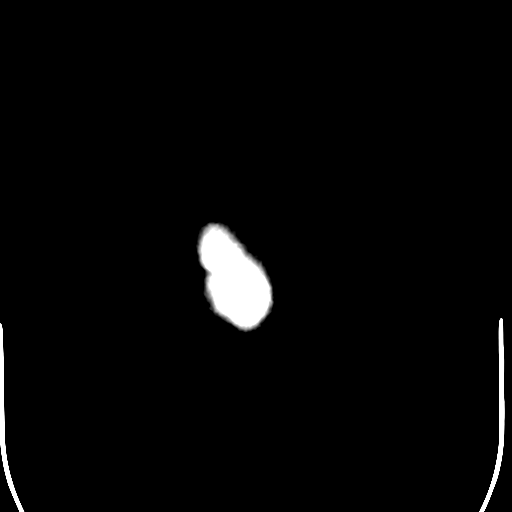

[Series 4: coronal soft tissue · coronal · 0.34mm/px · 3 of 75 slices shown]
[im 25/75  brain]
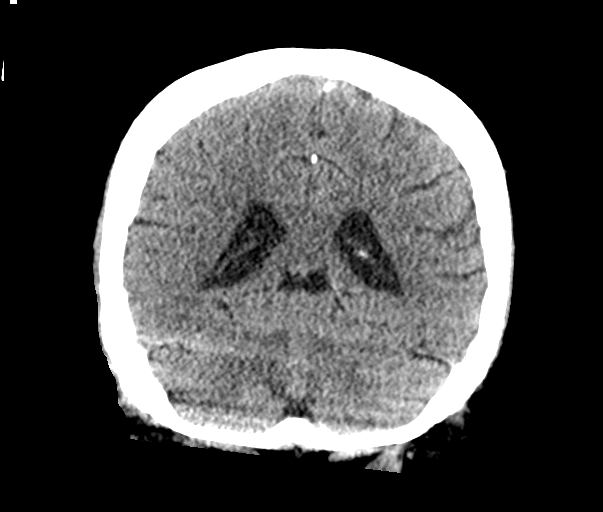
[im 33/75  brain]
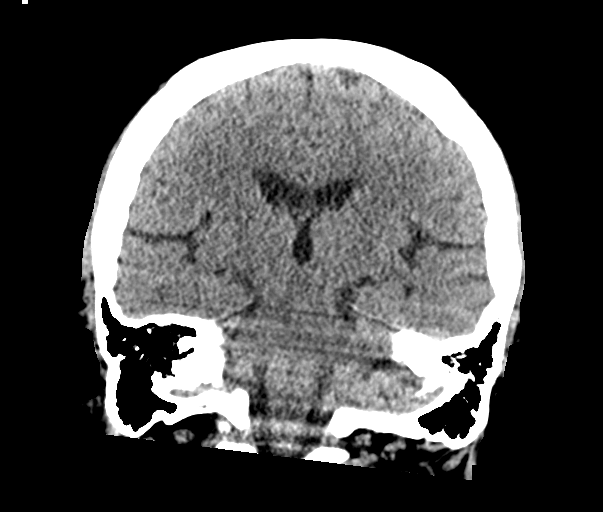
[im 42/75  brain]
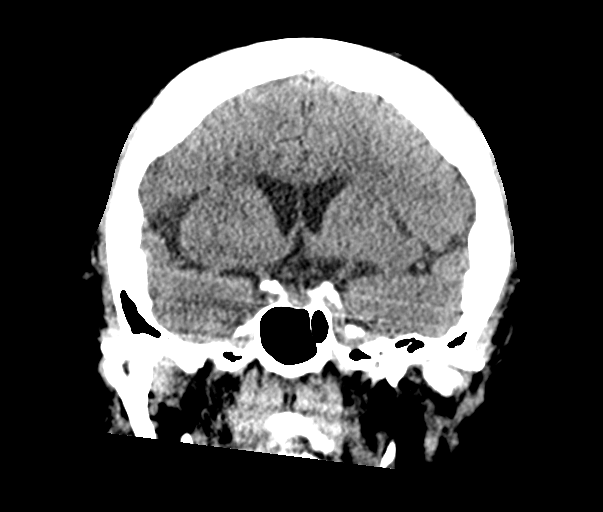

[Series 5: sagittal soft tissue · sagittal · 0.31mm/px · 3 of 61 slices shown]
[im 21/61  brain]
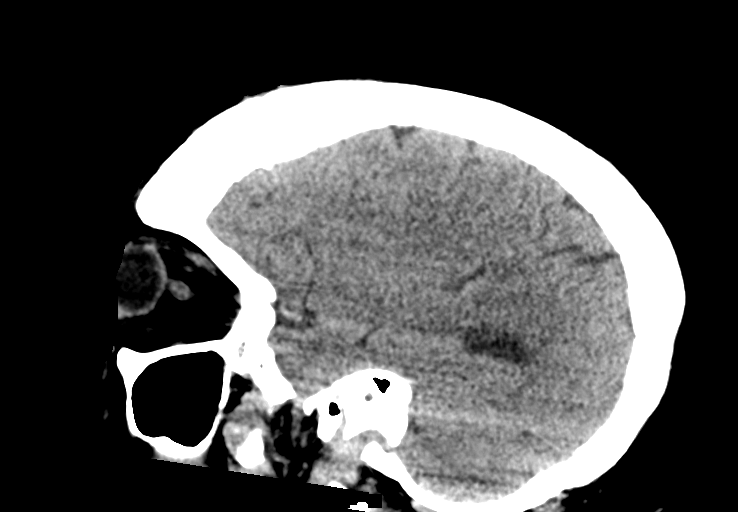
[im 31/61  brain]
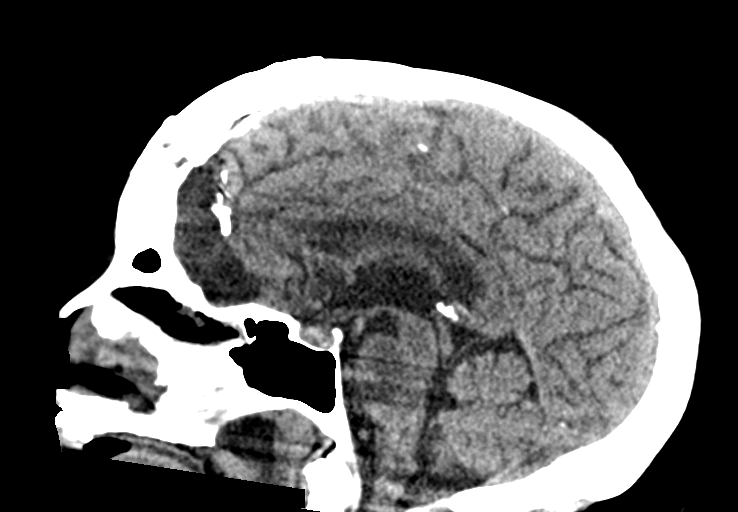
[im 41/61  brain]
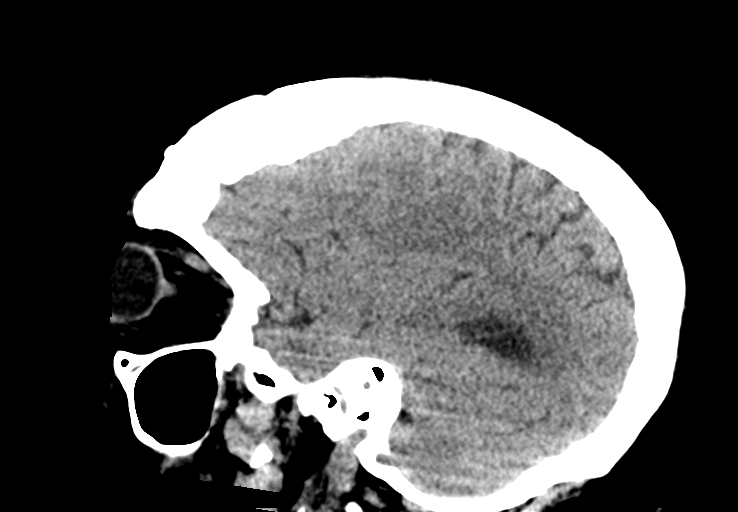

[14 of 47 positions shown; findings below may reference images not displayed]

FINDINGS: Brain: There is encephalomalacia and postsurgical change involving
the frontal lobes bilaterally. Previously, MRI demonstrated an
enhancing mass consistent with recurrent planum sphenoidale
meningioma measuring 8 x 8 x 4 mm. Minimal soft tissue is identified
within this same location, less apparent without intravenous
contrast. There is no significant mass effect or edema in this
region. No new mass or evidence for acute infarction. There is mild
central and cortical atrophy.

Vascular: There is dense atherosclerotic calcification of the
internal carotid arteries.

Skull: Status post bifrontal craniotomy. No acute calvarial
abnormality.

Sinuses/Orbits: No acute finding.

Other: None
IMPRESSION: 1. Status post bifrontal craniotomy.
2. Soft tissue density in the region of the known planum sphenoidale
meningioma shows no evidence for edema or mass effect by CT.
Consider MRI for more accurate comparison.
3. No evidence for acute injury.

## 2018-05-11 DIAGNOSIS — I639 Cerebral infarction, unspecified: Secondary | ICD-10-CM | POA: Diagnosis not present

## 2018-05-11 DIAGNOSIS — M150 Primary generalized (osteo)arthritis: Secondary | ICD-10-CM | POA: Diagnosis not present

## 2018-05-11 DIAGNOSIS — R32 Unspecified urinary incontinence: Secondary | ICD-10-CM | POA: Diagnosis not present

## 2018-05-12 DIAGNOSIS — J449 Chronic obstructive pulmonary disease, unspecified: Secondary | ICD-10-CM | POA: Diagnosis not present

## 2018-05-12 DIAGNOSIS — I10 Essential (primary) hypertension: Secondary | ICD-10-CM | POA: Diagnosis not present

## 2018-05-12 IMAGING — CR DG CHEST 1V PORT
1 series · 1 of 1 positions shown · non-contrast
Comparison: 10/16/2016

CLINICAL DATA: Acute respiratory failure.  COPD

EXAM:
PORTABLE CHEST 1 VIEW

[portable]
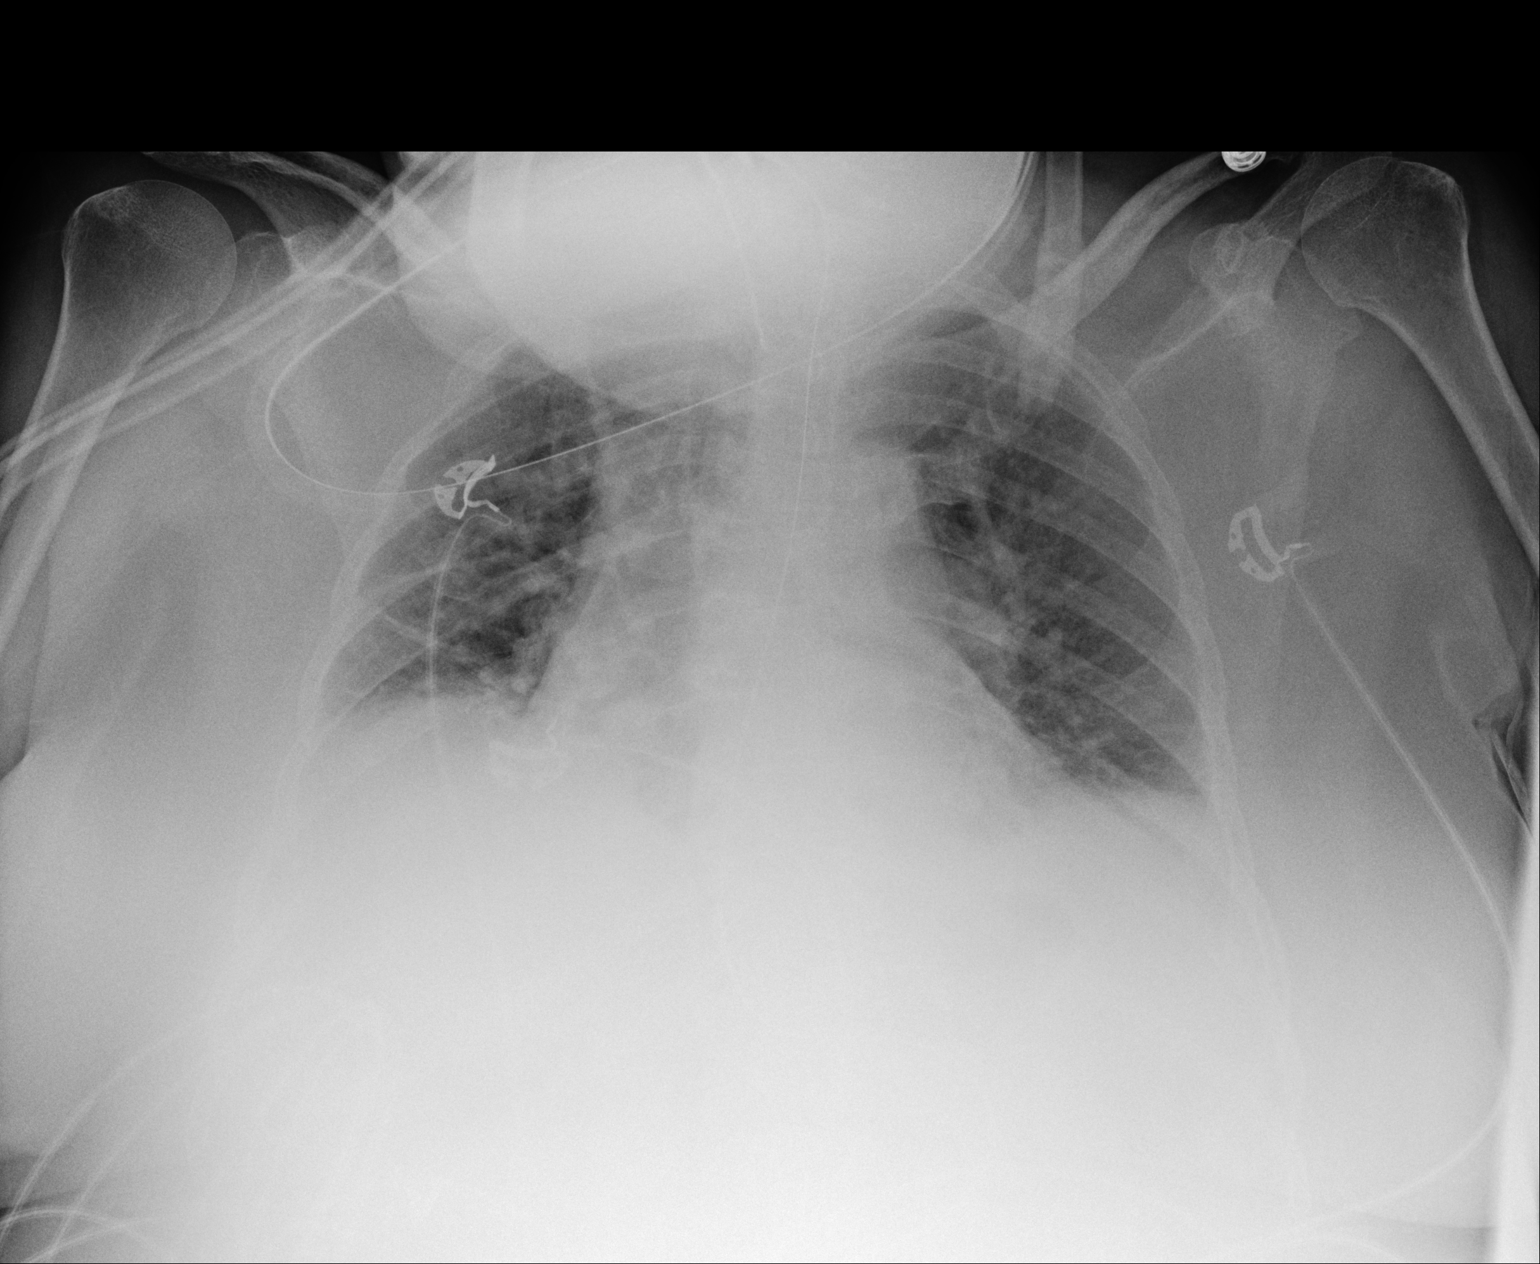

[1 of 1 positions shown; findings below may reference images not displayed]

FINDINGS: Endotracheal to tube 6.5 cm from carina. Tube projects over the
thoracic inlet. NG tube in stomach.

Extremely low lung volumes. Bibasilar atelectasis. No pulmonary
edema.
IMPRESSION: 1. Endotracheal tube appears high; however, this may be positional.
2. Low lung volumes and basilar atelectasis not changed.

## 2018-05-13 DIAGNOSIS — I110 Hypertensive heart disease with heart failure: Secondary | ICD-10-CM | POA: Diagnosis not present

## 2018-05-13 DIAGNOSIS — I69354 Hemiplegia and hemiparesis following cerebral infarction affecting left non-dominant side: Secondary | ICD-10-CM | POA: Diagnosis not present

## 2018-05-13 DIAGNOSIS — E1142 Type 2 diabetes mellitus with diabetic polyneuropathy: Secondary | ICD-10-CM | POA: Diagnosis not present

## 2018-05-13 DIAGNOSIS — Z794 Long term (current) use of insulin: Secondary | ICD-10-CM | POA: Diagnosis not present

## 2018-05-13 DIAGNOSIS — Z79899 Other long term (current) drug therapy: Secondary | ICD-10-CM | POA: Diagnosis not present

## 2018-05-13 DIAGNOSIS — F250 Schizoaffective disorder, bipolar type: Secondary | ICD-10-CM | POA: Diagnosis not present

## 2018-05-13 DIAGNOSIS — F17210 Nicotine dependence, cigarettes, uncomplicated: Secondary | ICD-10-CM | POA: Diagnosis not present

## 2018-05-13 DIAGNOSIS — I509 Heart failure, unspecified: Secondary | ICD-10-CM | POA: Diagnosis not present

## 2018-05-13 DIAGNOSIS — J449 Chronic obstructive pulmonary disease, unspecified: Secondary | ICD-10-CM | POA: Diagnosis not present

## 2018-05-14 ENCOUNTER — Other Ambulatory Visit: Payer: Self-pay

## 2018-05-14 ENCOUNTER — Encounter: Payer: Self-pay | Admitting: *Deleted

## 2018-05-14 DIAGNOSIS — R296 Repeated falls: Secondary | ICD-10-CM

## 2018-05-14 DIAGNOSIS — M4716 Other spondylosis with myelopathy, lumbar region: Secondary | ICD-10-CM

## 2018-05-14 MED ORDER — UNABLE TO FIND: 0 refills | Status: DC

## 2018-05-14 NOTE — Telephone Encounter (Signed)
Pt was advised that the request had been sent to Dr Moshe Cipro last week and that we were waiting on the notes to be addended before the rx was faxed because the insurance requires the updated visit notes before they will process.

## 2018-05-14 NOTE — Telephone Encounter (Signed)
Pt was calling back to check on the status of the lift chair

## 2018-05-22 ENCOUNTER — Ambulatory Visit (HOSPITAL_COMMUNITY): Payer: Medicare HMO | Admitting: Psychiatry

## 2018-05-22 DIAGNOSIS — L309 Dermatitis, unspecified: Secondary | ICD-10-CM | POA: Diagnosis not present

## 2018-05-22 DIAGNOSIS — R208 Other disturbances of skin sensation: Secondary | ICD-10-CM | POA: Diagnosis not present

## 2018-05-23 NOTE — Telephone Encounter (Signed)
When I reviewed the requirements, ne does not meet, needs to have severe arthritis of hip or  Knee and other  Issues which disqualify her . She has an appt I end of the month I will further address and clarify then , thanks

## 2018-05-23 NOTE — Telephone Encounter (Signed)
Patient aware.

## 2018-05-27 ENCOUNTER — Ambulatory Visit: Payer: Self-pay | Admitting: Internal Medicine

## 2018-05-27 ENCOUNTER — Other Ambulatory Visit: Payer: Self-pay | Admitting: Family Medicine

## 2018-05-27 DIAGNOSIS — Z794 Long term (current) use of insulin: Secondary | ICD-10-CM | POA: Diagnosis not present

## 2018-05-27 DIAGNOSIS — I509 Heart failure, unspecified: Secondary | ICD-10-CM | POA: Diagnosis not present

## 2018-05-27 DIAGNOSIS — E1142 Type 2 diabetes mellitus with diabetic polyneuropathy: Secondary | ICD-10-CM | POA: Diagnosis not present

## 2018-05-27 DIAGNOSIS — F250 Schizoaffective disorder, bipolar type: Secondary | ICD-10-CM | POA: Diagnosis not present

## 2018-05-27 DIAGNOSIS — I110 Hypertensive heart disease with heart failure: Secondary | ICD-10-CM | POA: Diagnosis not present

## 2018-05-27 DIAGNOSIS — I69354 Hemiplegia and hemiparesis following cerebral infarction affecting left non-dominant side: Secondary | ICD-10-CM | POA: Diagnosis not present

## 2018-05-27 DIAGNOSIS — Z79899 Other long term (current) drug therapy: Secondary | ICD-10-CM | POA: Diagnosis not present

## 2018-05-27 DIAGNOSIS — F17210 Nicotine dependence, cigarettes, uncomplicated: Secondary | ICD-10-CM | POA: Diagnosis not present

## 2018-05-27 DIAGNOSIS — J449 Chronic obstructive pulmonary disease, unspecified: Secondary | ICD-10-CM | POA: Diagnosis not present

## 2018-05-28 ENCOUNTER — Ambulatory Visit (INDEPENDENT_AMBULATORY_CARE_PROVIDER_SITE_OTHER): Payer: Medicare HMO | Admitting: Psychiatry

## 2018-05-28 ENCOUNTER — Other Ambulatory Visit: Payer: Self-pay

## 2018-05-28 ENCOUNTER — Ambulatory Visit (HOSPITAL_COMMUNITY): Payer: Medicare HMO | Admitting: Psychiatry

## 2018-05-28 DIAGNOSIS — F331 Major depressive disorder, recurrent, moderate: Secondary | ICD-10-CM | POA: Diagnosis not present

## 2018-05-28 NOTE — Progress Notes (Signed)
   THERAPIST PROGRESS NOTE  Session Time: Tuesday 05/28/2018 10:15 AM - 11:05 AM          Participation Level: Active  Behavioral Response: CasualAlertDepressed  Type of Therapy: Individual Therapy  Treatment Goals addressed: Learn and implement behavioral strategies to overcome depression.                                                   Verbalize an understanding and resolution of current interpersonal problems.   Interventions: CBT and Supportive  Summary: [name] is a 68 y.o. female who presents with  long standing history of recurrent periods  of depression beginning when she was 3 and her favorite uncle died. Patient reports multiple psychiatric hospitlaizations due to depression and suicidal ideations with the last one occuring in 1997. Patient has participated in outpatient psychotherapy and medication management intermittently since age 62. She currently is seeing psychiatrist Dr. Harrington Challenger . Prior to this, she was seen at [facility]. Patient also has had ECT at [facility]. Symptoms have worsened in recent months due to family stress and have  included depressed mood, anxiety, excessive worry, and tearfulness.   Patient last was about four weeks ago. She reports stress and  continuing to have periods of sadness and frustration related to interpersonal relationships. She expresses acceptance regarding relationships with son and grandson but reports now having more interpersonal issues with others. She has resumed attending church and is pleased she has done so. She expresses frustration and anger regarding interaction with some of the members and pastor. She reports pattern of becoming upset about other's comments and opinions. She reports pattern of verbally aggressive reactions. She expresses frustration she sometimes resorts to unhelpful coping techniques such as smoking. Suicidal/Homicidal: Nowithout intent/plan  Therapist Response: Discussed stressors, facilitated expression  of thoughts and feelings, validated feelings, reviewed/revised treatment plan ( therapist signed plan for patient per patient's request), began to discuss priorities and  behavioral targets for treatment ( learning and implementing relaxation techniques consistently, identifying/challenging/and replacing anger evoking thoughts with helpful alternatives, learning and implementing assertiveness skills and reducing aggressive behaviors) discussed rationale for practicing relaxation techniques regularly to cope with stress, assisted patient practice imagery, developed plan with patient to practice imagery daily between sessionsPlan: Return again in 2 weeks.  Diagnosis: Axis I: MDD, Recurrent, moderate    Axis II: Deferred    [name], LCSW 05/28/2018

## 2018-05-29 ENCOUNTER — Telehealth: Payer: Self-pay | Admitting: Internal Medicine

## 2018-05-29 NOTE — Telephone Encounter (Signed)
[name] is aware. Nothing further needed.

## 2018-05-29 NOTE — Telephone Encounter (Signed)
Spoke with [name] and notified of the below documentation per Judeen Hammans  She verbalized understanding and states nothing further needed

## 2018-05-29 NOTE — Telephone Encounter (Signed)
Called and spoke with [name], she stated that the patients CPAP machine has been broken for awhile. Patient has not been able to use the CPAP machine for awhile either. Patient is alert but she is not able to stay awake and per [name] the patient is very groggy. [name] wanted to know if the patient could get a new machine. I have copied the last message with the patient below.   [name] P       12:14 PM  Note    [name] and spoke to [name].  She states she spoke to pt on 1/8 and told her she is not eligible for new machine until June.  They offered her an appt to bring her machine in for them to look at it and she didn't want to bring it in.  They offered her a loaner for 50.00 and she didn't want to do that.  [name] is going to call her back now and go over all of that with her again.  Nothing further needed from [name] at this time.     ___________________________________________________________  I tried to call the patient and I was unable to reach. Per [name] she cannot get a new machine until June. [name] do you have any other options for the patients?   CY please also advise, thank you.

## 2018-05-29 NOTE — Telephone Encounter (Signed)
I know of no other options.  Insurance will not cover a new machine until June.  Per previous note she doesn't want to take the machine in to [facility] to get it fixed, she doesn't want a loaner for 50.00.

## 2018-05-29 NOTE — Telephone Encounter (Signed)
No suggestions, sorry. I assume she doesn't want to spend the money for repair or a loaner CPAP machine.  She should not be driving or operating machinery while she is so sleepy.

## 2018-05-31 DIAGNOSIS — E119 Type 2 diabetes mellitus without complications: Secondary | ICD-10-CM | POA: Diagnosis not present

## 2018-05-31 DIAGNOSIS — M6281 Muscle weakness (generalized): Secondary | ICD-10-CM | POA: Diagnosis not present

## 2018-05-31 DIAGNOSIS — Z9181 History of falling: Secondary | ICD-10-CM | POA: Diagnosis not present

## 2018-05-31 DIAGNOSIS — I69359 Hemiplegia and hemiparesis following cerebral infarction affecting unspecified side: Secondary | ICD-10-CM | POA: Diagnosis not present

## 2018-06-03 ENCOUNTER — Telehealth: Payer: Self-pay | Admitting: Cardiovascular Disease

## 2018-06-03 NOTE — Telephone Encounter (Signed)
I called and spoke to the patient about rescheduling her appointment given the [place] pandemic.  With respect to symptoms, she is doing well from a cardiac standpoint.  She is very agreeable to rescheduling her appointment and was thankful for the call.  I will have my office staff call her to reschedule.

## 2018-06-05 ENCOUNTER — Other Ambulatory Visit: Payer: Self-pay

## 2018-06-05 ENCOUNTER — Ambulatory Visit (INDEPENDENT_AMBULATORY_CARE_PROVIDER_SITE_OTHER): Payer: Medicare HMO | Admitting: Psychiatry

## 2018-06-05 ENCOUNTER — Encounter (HOSPITAL_COMMUNITY): Payer: Self-pay | Admitting: Psychiatry

## 2018-06-05 DIAGNOSIS — F419 Anxiety disorder, unspecified: Secondary | ICD-10-CM | POA: Diagnosis not present

## 2018-06-05 DIAGNOSIS — Z79899 Other long term (current) drug therapy: Secondary | ICD-10-CM | POA: Diagnosis not present

## 2018-06-05 DIAGNOSIS — F331 Major depressive disorder, recurrent, moderate: Secondary | ICD-10-CM | POA: Diagnosis not present

## 2018-06-05 MED ORDER — SERTRALINE HCL 100 MG PO TABS: 100.0000 mg | ORAL_TABLET | Freq: Every day | ORAL | 2 refills | Status: DC

## 2018-06-05 MED ORDER — RISPERIDONE 0.5 MG PO TABS: 0.5000 mg | ORAL_TABLET | Freq: Every day | ORAL | 2 refills | Status: DC

## 2018-06-05 MED ORDER — LAMOTRIGINE 100 MG PO TABS: 100.0000 mg | ORAL_TABLET | Freq: Two times a day (BID) | ORAL | 2 refills | Status: DC

## 2018-06-05 MED ORDER — TRAZODONE HCL 150 MG PO TABS: 75.0000 mg | ORAL_TABLET | Freq: Every day | ORAL | 2 refills | Status: DC

## 2018-06-05 MED ORDER — LORAZEPAM 0.5 MG PO TABS: 0.5000 mg | ORAL_TABLET | Freq: Two times a day (BID) | ORAL | 2 refills | Status: DC
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
## 2018-06-06 ENCOUNTER — Ambulatory Visit: Payer: Self-pay | Admitting: Family Medicine

## 2018-06-07 ENCOUNTER — Other Ambulatory Visit: Payer: Self-pay

## 2018-06-07 IMAGING — CR DG CHEST 1V PORT
1 series · 1 of 1 positions shown · non-contrast
Comparison: 10/17/2016

CLINICAL DATA: CODE SEPSIS. Pt fell at home this AM. Generalized
weakness. Hx stroke, HTN, GERD, COPD, diabetes, smoker

EXAM:
PORTABLE CHEST 1 VIEW

[portable]
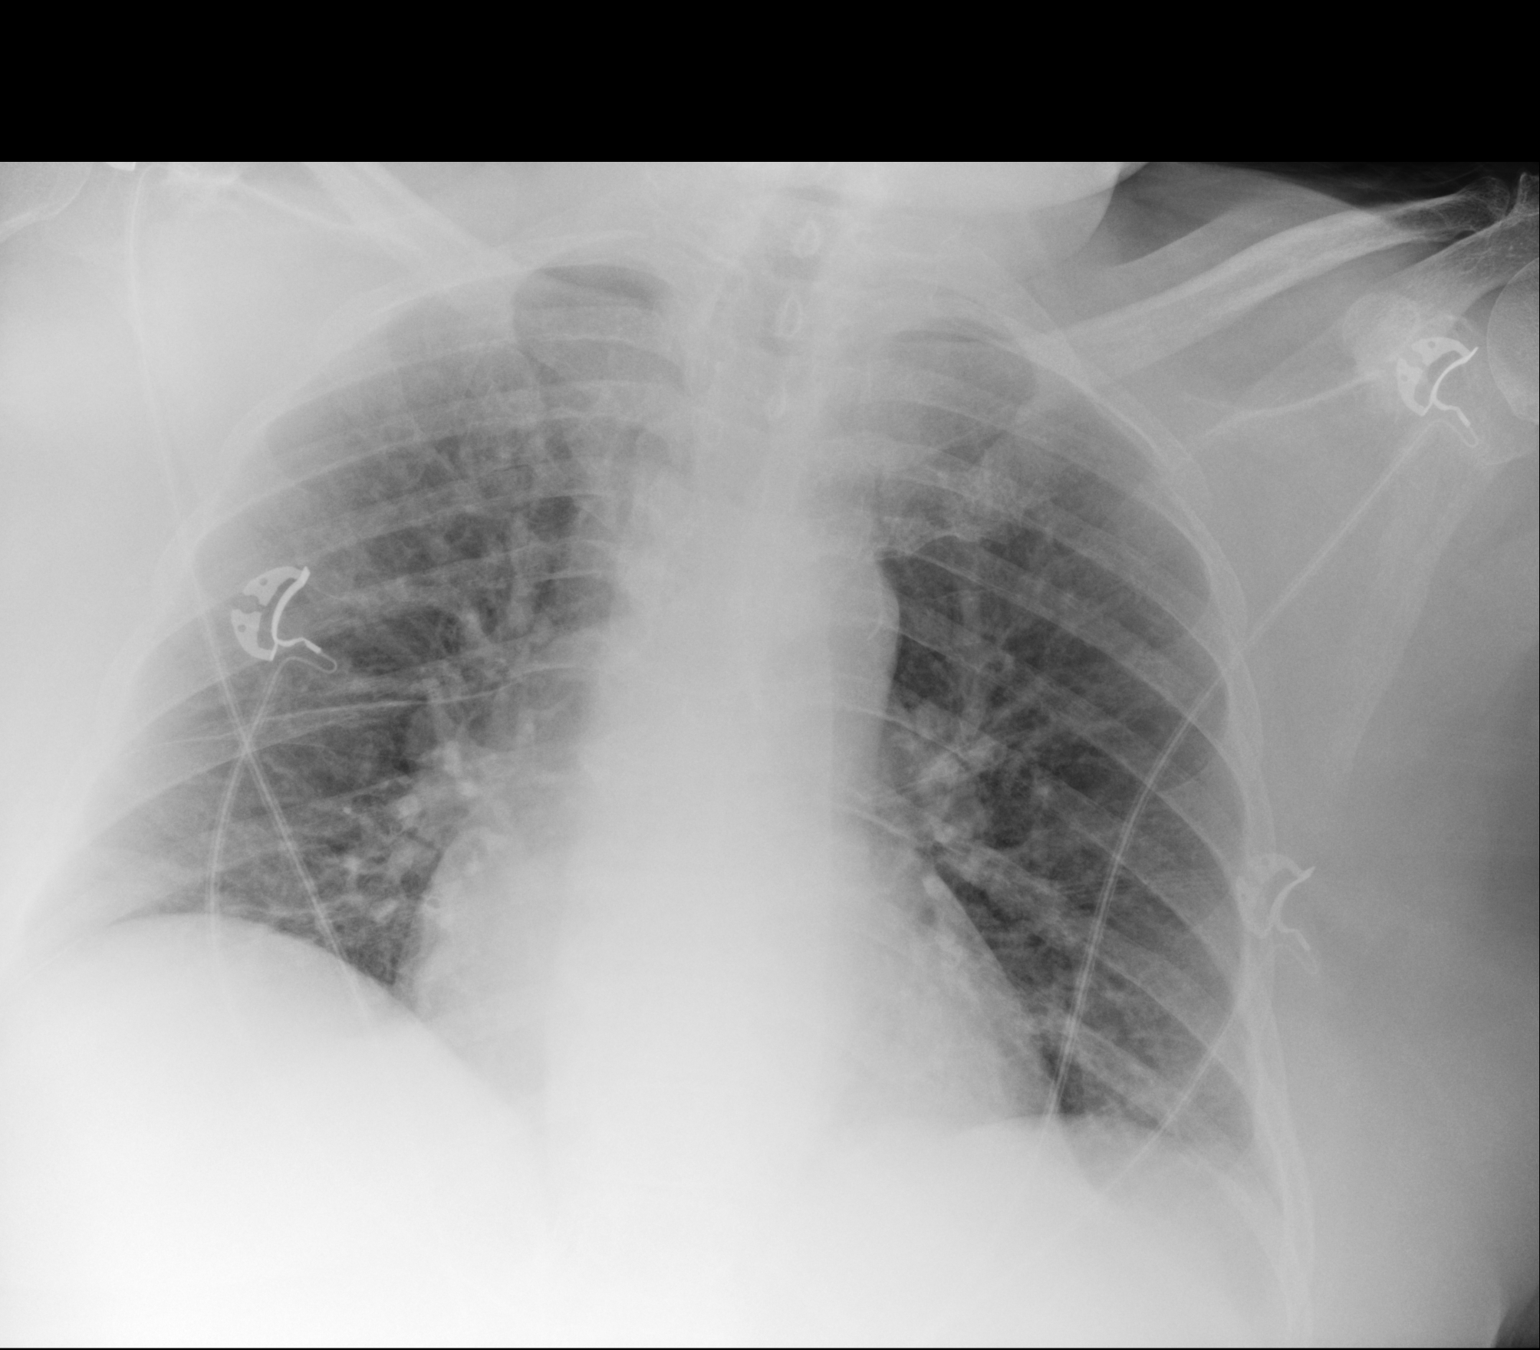

[1 of 1 positions shown; findings below may reference images not displayed]

FINDINGS: Upper zone pulmonary vascular prominence on this semi erect view of
the chest. Atherosclerotic calcification of the aortic arch.
Nasogastric and endotracheal tubes are no longer present. No overt
airspace opacity. Mildly low lung volumes. No blunting of the
costophrenic angles.
IMPRESSION: 1. Upper zone pulmonary vascular prominence could represent
pulmonary venous hypertension but is nonspecific in this patient due
to semi a recumbent positioning. No overt edema.
2. Borderline low lung volumes but improved from 10/17/2016.
3.  Aortic Atherosclerosis (LB005-ARF.F).

## 2018-06-07 NOTE — Patient Outreach (Signed)
[facility]) Care Management  06/07/2018  Fayola Meckes Pund 12/26/1950 [id number]    1st unsuccessful outreach to the patient for assessment.  No answer. HIPAA compliant voicemail left with contact information.  Plan: RN Health Coach will send letter. [facility] will make outreach attempt to the patient within thirty business days.   [name] RN, BSN, [place] Direct Dial:  [phone]  Fax: [phone]

## 2018-06-10 DIAGNOSIS — Z79899 Other long term (current) drug therapy: Secondary | ICD-10-CM | POA: Diagnosis not present

## 2018-06-10 DIAGNOSIS — F250 Schizoaffective disorder, bipolar type: Secondary | ICD-10-CM | POA: Diagnosis not present

## 2018-06-10 DIAGNOSIS — J449 Chronic obstructive pulmonary disease, unspecified: Secondary | ICD-10-CM | POA: Diagnosis not present

## 2018-06-10 DIAGNOSIS — E1142 Type 2 diabetes mellitus with diabetic polyneuropathy: Secondary | ICD-10-CM | POA: Diagnosis not present

## 2018-06-10 DIAGNOSIS — I509 Heart failure, unspecified: Secondary | ICD-10-CM | POA: Diagnosis not present

## 2018-06-10 DIAGNOSIS — F17210 Nicotine dependence, cigarettes, uncomplicated: Secondary | ICD-10-CM | POA: Diagnosis not present

## 2018-06-10 DIAGNOSIS — I69354 Hemiplegia and hemiparesis following cerebral infarction affecting left non-dominant side: Secondary | ICD-10-CM | POA: Diagnosis not present

## 2018-06-10 DIAGNOSIS — Z794 Long term (current) use of insulin: Secondary | ICD-10-CM | POA: Diagnosis not present

## 2018-06-10 DIAGNOSIS — I110 Hypertensive heart disease with heart failure: Secondary | ICD-10-CM | POA: Diagnosis not present

## 2018-06-11 ENCOUNTER — Encounter: Payer: Self-pay | Admitting: Family Medicine

## 2018-06-11 ENCOUNTER — Ambulatory Visit (INDEPENDENT_AMBULATORY_CARE_PROVIDER_SITE_OTHER): Payer: Medicare HMO | Admitting: Family Medicine

## 2018-06-11 DIAGNOSIS — J0100 Acute maxillary sinusitis, unspecified: Secondary | ICD-10-CM

## 2018-06-11 DIAGNOSIS — Z794 Long term (current) use of insulin: Secondary | ICD-10-CM

## 2018-06-11 DIAGNOSIS — R0989 Other specified symptoms and signs involving the circulatory and respiratory systems: Secondary | ICD-10-CM | POA: Diagnosis not present

## 2018-06-11 DIAGNOSIS — G4733 Obstructive sleep apnea (adult) (pediatric): Secondary | ICD-10-CM | POA: Diagnosis not present

## 2018-06-11 DIAGNOSIS — E1165 Type 2 diabetes mellitus with hyperglycemia: Secondary | ICD-10-CM | POA: Diagnosis not present

## 2018-06-11 DIAGNOSIS — M79602 Pain in left arm: Secondary | ICD-10-CM | POA: Diagnosis not present

## 2018-06-11 DIAGNOSIS — F17210 Nicotine dependence, cigarettes, uncomplicated: Secondary | ICD-10-CM | POA: Diagnosis not present

## 2018-06-11 DIAGNOSIS — M4716 Other spondylosis with myelopathy, lumbar region: Secondary | ICD-10-CM | POA: Diagnosis not present

## 2018-06-11 DIAGNOSIS — Z720 Tobacco use: Secondary | ICD-10-CM

## 2018-06-11 MED ORDER — AZITHROMYCIN 250 MG PO TABS: ORAL_TABLET | ORAL | 0 refills | Status: DC

## 2018-06-11 MED ORDER — PREDNISONE 5 MG PO TABS: 5.0000 mg | ORAL_TABLET | Freq: Two times a day (BID) | ORAL | 0 refills | Status: DC

## 2018-06-11 NOTE — Assessment & Plan Note (Signed)
Concerned about lack of CPAP machine , will send message to [name], and try to work through this

## 2018-06-11 NOTE — Patient Instructions (Addendum)
F/u in 4 months, call if you need me before  Medication is sent in today for sinus infection, Azithromycin for 5 days  Medication is sent in for left elbow and arm pain, prednisone for 5 days  I had planned to reach out to Pulmonary re problems you are having getting your CPAP machine   Please work on quitting smoking, although this is very difficult, it will help to protect you from all ling disease , and also cancer, strokes, and heart attacks  Call 1800QUIT NOW for 24/7 help  Eat on a regular schedule please, and test blood sugar regularly  Keep clutter out of home , and be careful not to fall.  Social distancing. Frequent hand washing with soap and water Keeping your hands off of your face. These 3 practices will help to keep both you and your community healthy during this time. Please practice them faithfully!

## 2018-06-11 NOTE — Assessment & Plan Note (Signed)
Chronic pain management at this time although reports that recently spine surgery was recommended , but no interest currently

## 2018-06-11 NOTE — Assessment & Plan Note (Signed)
Zpack sent in. 

## 2018-06-11 NOTE — Assessment & Plan Note (Signed)
Asked:confirms currently smokes cigarettes Assess: Unwilling to quit but cutting back Advise: needs to QUIT to reduce risk of cancer, cardio and cerebrovascular disease Assist: counseled for 5 minutes and literature provided Arrange: follow up in 3 months  

## 2018-06-11 NOTE — Assessment & Plan Note (Signed)
DASH diet and commitment to daily physical activity for a minimum of 30 minutes discussed and encouraged, as a part of hypertension management. The importance of attaining a healthy weight is also discussed.  BP/Weight 05/03/2018 04/17/2018 04/09/2018 04/02/2018 03/26/2018 03/19/1276 09/10/8365  Systolic BP 255 001 642 903 795 583 167  Diastolic BP 34 68 60 55 52 58 58  Wt. (Lbs) 168 163 163 - - 164 165  BMI 33.93 32.92 32.92 - - 33.12 33.33  Some encounter information is confidential and restricted. Go to Review Flowsheets activity to see all data.

## 2018-06-11 NOTE — Assessment & Plan Note (Signed)
[name] is reminded of the importance of commitment to daily physical activity for 30 minutes or more, as able and the need to limit carbohydrate intake to 30 to 60 grams per meal to help with blood sugar control.   The need to take medication as prescribed, test blood sugar as directed, and to call between visits if there is a concern that blood sugar is uncontrolled is also discussed.   [name] is reminded of the importance of daily foot exam, annual eye examination, and good blood sugar, blood pressure and cholesterol control.  Diabetic Labs Latest Ref Rng & Units 04/26/2018 02/18/2018 02/15/2018 02/11/2018 01/29/2018  HbA1c 4.0 - 5.6 % - 5.1 - - -  Microalbumin <2.0 mg/dL - - - - -  Micro/Creat Ratio 0.0 - 30.0 mg/g - - - - -  Chol 100 - 199 mg/dL - - - 143 -  HDL >39 mg/dL - - - 53 -  Calc LDL 0 - 99 mg/dL - - - 72 -  Triglycerides 0 - 149 mg/dL - - - 89 -  Creatinine 0.44 - 1.00 mg/dL 1.02(H) - 0.89 - 1.17(H)  GFR >60.00 mL/min - - - - -   BP/Weight 05/03/2018 04/17/2018 04/09/2018 04/02/2018 03/26/2018 09/14/9161 10/14/6657  Systolic BP 935 701 779 390 300 923 300  Diastolic BP 34 68 60 55 52 58 58  Wt. (Lbs) 168 163 163 - - 164 165  BMI 33.93 32.92 32.92 - - 33.12 33.33  Some encounter information is confidential and restricted. Go to Review Flowsheets activity to see all data.   Foot/eye exam completion dates Latest Ref Rng & Units 04/30/2018 11/20/2017  Eye Exam No Retinopathy Retinopathy(A) -  Foot exam Order - - -  Foot Form Completion - - Done   Managed by endo, updated lab needed, reports highest bS is 200, states poor appetite and not eating as much

## 2018-06-11 NOTE — Progress Notes (Signed)
Virtual Visit via Telephone Note  I connected with [name] on 06/11/18 at 10:00 AM EDT by telephone and verified that I am speaking with the correct person using two identifiers.   I discussed the limitations, risks, security and privacy concerns of performing an evaluation and management service by telephone and the availability of in person appointments. I also discussed with the patient that there may be a patient responsible charge related to this service. The patient expressed understanding and agreed to proceed. I am in the office , and patient is in her home. I attempted to face time, however , she is unable to use the feature on her phone though she states that her phone has the capability   History of Present Illness: Denies recent fever or chills. C/o sinus pressure over cheeks  x 5 days, increased post nasal drainage, slight sore throat, no ear pain , yellow nasal drainage Denies chest congestion, slight cough, no sputum, no wheeze Denies chest pains, palpitations and leg swelling Denies abdominal pain, nausea, vomiting,diarrhea or constipation.   Denies dysuria, frequency, hesitancy or incontinence. C/o  joint pain,  and limitation in mobility.especially elbow to hand, the left , still slight swelling in the elbow she reports after Nurse evaluated her yesterday, pain awakens her  Denies headaches, seizures, numbness, or tingling. Denies depression, anxiety or insomnia. Has been to dermatology re skin, needs medication from [facility] reportedly and will follow through on this, not using excess Vaseline to moisturize skin. States needs CPAP machine , not hearing back from the office , will send a message , states over ride will get this from [place], has $50 co pay, can't afford, feels as though [facility] will work with her, major issue , on record review, she has been in touch with his Office approximately  1 week ago, and has a response , so no need to pursue this. Still working with  therapist  To help with anxiety , breathing deeply and also has been holding in things she should not , working on this, not suicidal or homicidal       Observations/Objective:    Assessment and Plan: Light cigarette smoker (1-9 cigarettes per day) Asked:confirms currently smokes cigarettes Assess: Unwilling to quit but cutting back Advise: needs to QUIT to reduce risk of cancer, cardio and cerebrovascular disease Assist: counseled for 5 minutes and literature provided Arrange: follow up in 3 months   Labile hypertension DASH diet and commitment to daily physical activity for a minimum of 30 minutes discussed and encouraged, as a part of hypertension management. The importance of attaining a healthy weight is also discussed.  BP/Weight 05/03/2018 04/17/2018 04/09/2018 04/02/2018 03/26/2018 10/14/3823 0/07/3974  Systolic BP 734 193 790 240 973 532 992  Diastolic BP 34 68 60 55 52 58 58  Wt. (Lbs) 168 163 163 - - 164 165  BMI 33.93 32.92 32.92 - - 33.12 33.33  Some encounter information is confidential and restricted. Go to Review Flowsheets activity to see all data.       Obstructive sleep apnea Concerned about lack of CPAP machine , will send message to [name], and try to work through this  Acute sinusitis Z pack sent in   Tobacco user Asked:confirms currently smokes cigarettes Assess: Unwilling to quit but cutting back Advise: needs to QUIT to reduce risk of cancer, cardio and cerebrovascular disease Assist: counseled for 5 minutes and literature provided Arrange: follow up in 3 months   Type 2 diabetes mellitus with  hyperglycemia, with long-term current use of insulin [facility]) Ms. Lovan is reminded of the importance of commitment to daily physical activity for 30 minutes or more, as able and the need to limit carbohydrate intake to 30 to 60 grams per meal to help with blood sugar control.   The need to take medication as prescribed, test blood sugar as directed, and to call  between visits if there is a concern that blood sugar is uncontrolled is also discussed.   Ms. Palinkas is reminded of the importance of daily foot exam, annual eye examination, and good blood sugar, blood pressure and cholesterol control.  Diabetic Labs Latest Ref Rng & Units 04/26/2018 02/18/2018 02/15/2018 02/11/2018 01/29/2018  HbA1c 4.0 - 5.6 % - 5.1 - - -  Microalbumin <2.0 mg/dL - - - - -  Micro/Creat Ratio 0.0 - 30.0 mg/g - - - - -  Chol 100 - 199 mg/dL - - - 143 -  HDL >39 mg/dL - - - 53 -  Calc LDL 0 - 99 mg/dL - - - 72 -  Triglycerides 0 - 149 mg/dL - - - 89 -  Creatinine 0.44 - 1.00 mg/dL 1.02(H) - 0.89 - 1.17(H)  GFR >60.00 mL/min - - - - -   BP/Weight 05/03/2018 04/17/2018 04/09/2018 04/02/2018 03/26/2018 06/15/8183 08/14/1495  Systolic BP 026 378 588 502 774 128 786  Diastolic BP 34 68 60 55 52 58 58  Wt. (Lbs) 168 163 163 - - 164 165  BMI 33.93 32.92 32.92 - - 33.12 33.33  Some encounter information is confidential and restricted. Go to Review Flowsheets activity to see all data.   Foot/eye exam completion dates Latest Ref Rng & Units 04/30/2018 11/20/2017  Eye Exam No Retinopathy Retinopathy(A) -  Foot exam Order - - -  Foot Form Completion - - Done   Managed by endo, updated lab needed, reports highest bS is 200, states poor appetite and not eating as much     Lumbar spondylosis with myelopathy Chronic pain management at this time although reports that recently spine surgery was recommended , but no interest currently  Left arm pain Flare and uncontrolled for approx 3  weeks. Steroid injection to elbow of minimal benefit, states pain is waking her at this time, short course of prednisone prescribed    Follow Up Instructions:    I discussed the assessment and treatment plan with the patient. The patient was provided an opportunity to ask questions and all were answered. The patient agreed with the plan and demonstrated an understanding of the instructions.   The patient was  advised to call back or seek an in-person evaluation if the symptoms worsen or if the condition fails to improve as anticipated.  I provided 30  minutes of non-face-to-face time during this encounter.   Tula Nakayama, MD

## 2018-06-11 NOTE — Assessment & Plan Note (Signed)
Flare and uncontrolled for approx 3  weeks. Steroid injection to elbow of minimal benefit, states pain is waking her at this time, short course of prednisone prescribed

## 2018-06-12 ENCOUNTER — Other Ambulatory Visit: Payer: Self-pay

## 2018-06-12 ENCOUNTER — Encounter: Payer: Self-pay | Admitting: Family Medicine

## 2018-06-12 ENCOUNTER — Ambulatory Visit: Payer: Self-pay | Admitting: Family Medicine

## 2018-06-12 DIAGNOSIS — F250 Schizoaffective disorder, bipolar type: Secondary | ICD-10-CM | POA: Diagnosis not present

## 2018-06-12 DIAGNOSIS — F17210 Nicotine dependence, cigarettes, uncomplicated: Secondary | ICD-10-CM | POA: Diagnosis not present

## 2018-06-12 DIAGNOSIS — Z79899 Other long term (current) drug therapy: Secondary | ICD-10-CM | POA: Diagnosis not present

## 2018-06-12 DIAGNOSIS — I110 Hypertensive heart disease with heart failure: Secondary | ICD-10-CM | POA: Diagnosis not present

## 2018-06-12 DIAGNOSIS — J449 Chronic obstructive pulmonary disease, unspecified: Secondary | ICD-10-CM

## 2018-06-12 DIAGNOSIS — Z794 Long term (current) use of insulin: Secondary | ICD-10-CM | POA: Diagnosis not present

## 2018-06-12 DIAGNOSIS — Z0000 Encounter for general adult medical examination without abnormal findings: Secondary | ICD-10-CM

## 2018-06-12 DIAGNOSIS — I10 Essential (primary) hypertension: Secondary | ICD-10-CM | POA: Diagnosis not present

## 2018-06-12 DIAGNOSIS — I509 Heart failure, unspecified: Secondary | ICD-10-CM | POA: Diagnosis not present

## 2018-06-12 DIAGNOSIS — E1142 Type 2 diabetes mellitus with diabetic polyneuropathy: Secondary | ICD-10-CM | POA: Diagnosis not present

## 2018-06-12 DIAGNOSIS — I69354 Hemiplegia and hemiparesis following cerebral infarction affecting left non-dominant side: Secondary | ICD-10-CM | POA: Diagnosis not present

## 2018-06-12 MED ORDER — AZITHROMYCIN 250 MG PO TABS: ORAL_TABLET | ORAL | 0 refills | Status: DC

## 2018-06-12 MED ORDER — PREDNISONE 5 MG PO TABS: 5.0000 mg | ORAL_TABLET | Freq: Two times a day (BID) | ORAL | 0 refills | Status: AC

## 2018-06-12 NOTE — Progress Notes (Addendum)
Location of Patient: Home Location of Provider: Telehealth Consent was obtain for visit to be over via telehealth.    Subjective:   [name] is a 68 y.o. female who presents for Medicare Annual (Subsequent) preventive examination.  [name] presented to have her annual wellness visit via telemedicine today over the telephone.  She is in good spirits and has no complaints today.    Review of Systems:   Review of Systems  Constitutional: Negative for chills and fever.  HENT: Negative for congestion, sinus pain and sore throat.   Eyes: Negative.   Respiratory: Negative for cough, shortness of breath and wheezing.   Cardiovascular: Negative for chest pain, palpitations and leg swelling.  Gastrointestinal: Negative.   Genitourinary: Negative.   Musculoskeletal: Negative.   Skin: Negative.   Neurological: Negative.   Psychiatric/Behavioral: Negative.           Objective:     Vitals: There were no vitals taken for this visit.  There is no height or weight on file to calculate BMI.  Advanced Directives 05/03/2018 04/02/2018 03/26/2018 02/26/2018 01/31/2018 11/01/2017 10/31/2017  Does Patient Have a Medical Advance Directive? No Yes No No No No No  Type of Advance Directive - Living will Living will - - - -  Does patient want to make changes to medical advance directive? No - Patient declined No - Patient declined No - Patient declined - - - -  Copy of [employer] in [place]  Would patient like information on creating a medical advance directive? No - Patient declined No - Patient declined No - Patient declined No - Patient declined Yes (ED - Information included in AVS) - No - Patient declined  Pre-existing out of facility DNR order (yellow form or pink MOST form) - - - - - - -    Tobacco Social History   Tobacco Use  Smoking Status Current Every Day Smoker  . Packs/day: 0.50  . Types: Cigarettes  Smokeless Tobacco Never Used  Tobacco  Comment   5-10 cigs a day     Ready to quit: Not Answered Counseling given: Not Answered Comment: 5-10 cigs a day   Clinical Intake:                       Past Medical History:  Diagnosis Date  . Allergy   . Anemia   . Anxiety    takes Ativan daily  . Arthritis   . Assistance needed for mobility   . Bipolar disorder ([place])    takes Risperdal nightly  . Blood transfusion   . Cancer ([place])    In her gum  . Carpal tunnel syndrome of right wrist 05/23/2011  . Cervical disc disorder with radiculopathy of cervical region 10/31/2012  . Chronic back pain   . Chronic idiopathic constipation   . Chronic neck and back pain   . Colon polyps   . COPD (chronic obstructive pulmonary disease) with chronic bronchitis ([place]) 09/16/2013   Office Spirometry 10/30/2013-submaximal effort based on appearance of loop and curve. Numbers would fit with severe restriction but her physiologic capability may be better than this. FVC 0.91/44%, and 10.74/45%, FEV1/FVC 0.81, FEF 25-75% 1.43/69%    . Depression    takes Zoloft daily  . Diabetes mellitus    Type II  . Diverticulosis    TCS 9/08 by Dr. Delfin Edis for diarrhea . Bx for micro scopic  colitis negative.   . Fibromyalgia   . Frequent falls   . GERD (gastroesophageal reflux disease)    takes Aciphex daily  . Glaucoma    eye drops daily  . Gum symptoms    infection on antibiotic  . Hemiplegia affecting non-dominant side, post-stroke 08/02/2011  . Hyperlipidemia    takes Crestor daily  . Hypertension    takes Amlodipine,Metoprolol,and Clonidine daily  . Hypothyroidism    takes Synthroid daily  . IBS (irritable bowel syndrome)   . Insomnia    takes Trazodone nightly  . 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 Metabolic encephalopathy 06/22/8784  . Migraines    chronic headaches  . Mononeuritis lower limb   . Narcolepsy   . Osteoporosis   . Pancreatitis 2006   due to Depakote with normal EUS   . Schatzki's ring    non critical /  EGD with ED 8/2011with RMR  . Seizures ([place])    takes Lamictal daily.Last seizure 3 yrs ago  . Sleep apnea    on CPAP  . Stroke [facility])    left sided weakness, speech changes  . Tubular adenoma of colon    Past Surgical History:  Procedure Laterality Date  . ABDOMINAL HYSTERECTOMY  1978  . BACK SURGERY  July 2012  . BACTERIAL OVERGROWTH TEST N/A 05/05/2013   Procedure: BACTERIAL OVERGROWTH TEST;  Surgeon: Daneil Dolin, MD;  [facility];  Service: Endoscopy;  Laterality: N/A;  7:30  . BIOPSY THYROID  2009  . BRAIN SURGERY  11/2011   resection of meningioma  . BREAST REDUCTION SURGERY  1994  . CARDIAC CATHETERIZATION  05/10/2005   normal coronaries, normal LV systolic function and EF (Dr. Jackie Plum)  . CARPAL TUNNEL RELEASE Left 07/22/04   Dr. Aline Brochure  . CATARACT EXTRACTION Bilateral   . CHOLECYSTECTOMY  1984  . COLONOSCOPY N/A 09/25/2012   VEH:MCNOBSJ diverticulosis.  colonic polyp-removed : tubular adenoma  . CRANIOTOMY  11/23/2011   Procedure: CRANIOTOMY TUMOR EXCISION;  Surgeon: Hosie Spangle, MD;  Location: [place] [facility]O ORS;  Service: Neurosurgery;  Laterality: N/A;  Craniotomy for tumor resection  . ESOPHAGOGASTRODUODENOSCOPY  12/29/2010   [place]-Retained food in the esophagus and stomach, small hiatal hernia, status post Maloney dilation of the esophagus  . ESOPHAGOGASTRODUODENOSCOPY N/A 09/25/2012   GGE:ZMOQHUTM atonic baggy esophagus status post Maloney dilation 87 F. Hiatal hernia  . GIVENS CAPSULE STUDY N/A 01/15/2013   NORMAL.   . IR GENERIC HISTORICAL  03/17/2016   IR RADIOLOGIST EVAL & MGMT 03/17/2016 [facility]-INTERV RAD  . LESION REMOVAL N/A 05/31/2015   Procedure: REMOVAL RIGHT AND LEFT LESIONS OF MANDIBLE;  Surgeon: Diona Browner, DDS;  Location: [place];  Service: Oral Surgery;  Laterality: N/A;  . MALONEY DILATION  12/29/2010   RMR;  . NM MYOCAR PERF WALL MOTION  2006   "relavtiely normal" persantine, mild anterior thinning (breast attenuation artifact), no region of  scar/ischemia  . OVARIAN CYST REMOVAL    . RECTOCELE REPAIR N/A 06/29/2015   Procedure: POSTERIOR REPAIR (RECTOCELE);  Surgeon: Jonnie Kind, MD;  Location: [facility];  Service: Gynecology;  Laterality: N/A;  . REDUCTION MAMMAPLASTY Bilateral   . SPINE SURGERY  09/29/2010   Dr. Rolena Infante  . surgical excision of 3 tumors from right thigh and right buttock  and left upper thigh  2010  . TOOTH EXTRACTION Bilateral 12/14/2014   Procedure: REMOVAL OF BILATERAL MANDIBULAR EXOSTOSES;  Surgeon: Diona Browner, DDS;  Location: [place];  Service: Oral Surgery;  Laterality: Bilateral;  . TRANSTHORACIC ECHOCARDIOGRAM  2010   EF 60-65%, mild conc LVH, grade 1 diastolic dysfunction; mildly calcified MV annulus with mildly thickened leaflets, mildly calcified MR annulus   Family History  Problem Relation Age of Onset  . Heart attack Mother        HTN  . Pneumonia Father   . Kidney failure Father   . Diabetes Father   . Pancreatic cancer Sister   . Diabetes Brother   . Hypertension Brother   . Diabetes Brother   . Cancer Sister        breast   . Hypertension Son   . Sleep apnea Son   . Cancer Sister        pancreatic  . Stroke Maternal Grandmother   . Heart attack Maternal Grandfather   . Alcohol abuse Maternal Uncle   . Colon cancer Neg Hx   . Anesthesia problems Neg Hx   . Hypotension Neg Hx   . Malignant hyperthermia Neg Hx   . Pseudochol deficiency Neg Hx    Social History   Socioeconomic History  . Marital status: Divorced    Spouse name: Not on file  . Number of children: 1  . Years of education: 47  . Highest education level: Not on file  Occupational History  . Occupation: Disabled  Social Needs  . Financial resource strain: Not very hard  . Food insecurity:    Worry: Never true    Inability: Never true  . Transportation needs:    Medical: No    Non-medical: Not on file  Tobacco Use  . Smoking status: Current Every Day Smoker    Packs/day: 0.50    Types: Cigarettes  .  Smokeless tobacco: Never Used  . Tobacco comment: 5-10 cigs a day  Substance and Sexual Activity  . Alcohol use: No    Alcohol/week: 0.0 standard drinks    Comment:    . Drug use: No  . Sexual activity: Not Currently  Lifestyle  . Physical activity:    Days per week: 0 days    Minutes per session: 0 min  . Stress: Only a little  Relationships  . Social connections:    Talks on phone: More than three times a week    Gets together: Never    Attends religious service: 1 to 4 times per year    Active member of club or organization: No    Attends meetings of clubs or organizations: Never    Relationship status: Divorced  Other Topics Concern  . Not on file  Social History Narrative   01/29/18 Lives alone, has 3 aides, Mon- Fri 8 hrs, 2 hrs on Sat-Sun, RN manages her meds   Caffeine use: Drink coffee sometimes    Right handed     Outpatient Encounter Medications as of 06/12/2018  Medication Sig  . ACCU-CHEK FASTCLIX LANCETS MISC Use to check blood sugar 4 times a day  . albuterol (PROVENTIL HFA;VENTOLIN HFA) 108 (90 Base) MCG/ACT inhaler Inhale 1-2 puffs every 6 hours as needed for wheezing, shortness of breath  . amLODipine (NORVASC) 10 MG tablet Take 1 tablet (10 mg total) by mouth daily.  [name] ascorbic acid (VITAMIN C) 500 MG tablet Take 500 mg by mouth daily.  [name] aspirin EC 81 MG tablet Take 81 mg by mouth daily.  [name] azithromycin (ZITHROMAX) 250 MG tablet Take 2 tablets on day one, then take one tablet once daily for an additional 4 days  . Blood  Glucose Monitoring Suppl (ACCU-CHEK AVIVA) device Use as instructed  . cetirizine (ZYRTEC) 10 MG tablet Take 1 tablet (10 mg total) by mouth daily.  . Cholecalciferol (VITAMIN D3) 5000 units CAPS Take 1 capsule by mouth daily.  . diclofenac sodium (VOLTAREN) 1 % GEL Apply 2 g topically 4 (four) times daily.  [name] docusate sodium (COLACE) 100 MG capsule Take 100 mg by mouth 2 (two) times daily.  . DROPLET PEN NEEDLES 32G X 4 MM MISC USE  TO  INJECT FOUR TIMES DAILY  . glucose blood (ACCU-CHEK AVIVA) test strip Use to check blood sugar 4 times a day  . hydroxychloroquine (PLAQUENIL) 200 MG tablet Take 200 mg by mouth daily.   . hydrOXYzine (ATARAX/VISTARIL) 10 MG tablet Take 1 tablet (10 mg total) by mouth 3 (three) times daily as needed.  . insulin aspart (NOVOLOG FLEXPEN) 100 UNIT/ML FlexPen INJECT 10 TO 14 UNITS INTO THE SKIN 3 (THREE) TIMES DAILY WITH MEALS.  [name] Insulin Glargine (TOUJEO SOLOSTAR) 300 UNIT/ML SOPN Inject 20 Units into the skin at bedtime.  . lamoTRIgine (LAMICTAL) 100 MG tablet Take 1 tablet (100 mg total) by mouth 2 (two) times daily.  [name] levothyroxine (SYNTHROID, LEVOTHROID) 50 MCG tablet TAKE 1 TABLET  DAILY  EXCEPT TAKE 1/2 TABLET  ON  SUNDAY  . LORazepam (ATIVAN) 0.5 MG tablet Take 1 tablet (0.5 mg total) by mouth 2 (two) times daily.  [name] losartan (COZAAR) 50 MG tablet TAKE 1 TABLET EVERY DAY  . metaxalone (SKELAXIN) 800 MG tablet metaxalone 800 mg tablet  take 1 tablet by mouth 3 times daily as needed.  . metoprolol tartrate (LOPRESSOR) 50 MG tablet TAKE 1 TABLET TWICE DAILY (NEED MD APPOINTMENT)  . montelukast (SINGULAIR) 10 MG tablet TAKE 1 TABLET EVERY DAY  . Multiple Vitamins-Minerals (HM MULTIVITAMIN ADULT GUMMY PO) Take 1 tablet by mouth daily.  . Nicotine (NICOTROL NS) 10 MG/ML SOLN Use 1 spray per nostril up to eight times per day as needed  . nystatin (MYCOSTATIN/NYSTOP) powder APPLY TO AFFECTED AREA 4 TIMES DAILY.  [name] ondansetron (ZOFRAN) 4 MG tablet Take 1 tablet by mouth every 6 hours as needed for nausea.  . predniSONE (DELTASONE) 5 MG tablet Take 1 tablet (5 mg total) by mouth 2 (two) times daily for 5 days.  . pregabalin (LYRICA) 75 MG capsule Take 1 capsule (75 mg total) by mouth daily.  . RABEprazole (ACIPHEX) 20 MG tablet Take 1 tablet (20 mg total) by mouth 2 (two) times daily.  . RESTASIS 0.05 % ophthalmic emulsion Place 1 drop into both eyes 2 (two) times daily.   . risperiDONE (RISPERDAL)  0.5 MG tablet Take 1 tablet (0.5 mg total) by mouth at bedtime.  . rosuvastatin (CRESTOR) 5 MG tablet TAKE 1 TABLET AT BEDTIME  . sertraline (ZOLOFT) 100 MG tablet Take 1 tablet (100 mg total) by mouth daily.  . traZODone (DESYREL) 150 MG tablet Take 0.5 tablets (75 mg total) by mouth at bedtime.  . triamcinolone ointment (KENALOG) 0.1 % Apply 1 application topically at bedtime. Apply to elbows at bedtime.  [name] UNABLE TO FIND Transport Chair x 1  Dx-bilateral leg weakness, frequent falls  . UNABLE TO FIND Rolling walker with seat x 1  DX. Bilateral lower extremity weakness, frequent falls, instability  . UNABLE TO FIND Lift chair x 1  DX M47.16, R29.6   No facility-administered encounter medications on file as of 06/12/2018.     Activities of Daily Living In your present state of health,  do you have any difficulty performing the following activities: 01/31/2018  Hearing? Y  Vision? Y  Difficulty concentrating or making decisions? Y  Walking or climbing stairs? Y  Dressing or bathing? Y  Doing errands, shopping? Y  Preparing Food and eating ? Y  Using the Toilet? N  In the past six months, have you accidently leaked urine? Y  Do you have problems with loss of bowel control? N  Managing your Medications? Y  Managing your Finances? Y  Housekeeping or managing your Housekeeping? Y  Some recent data might be hidden    Patient Care Team: [name], MD as PCP - General Herminio Commons, MD as PCP - Cardiology (Cardiology) Gala Romney Cristopher Estimable, MD (Gastroenterology) Deneise Lever, MD as Consulting Physician (Pulmonary Disease) Jovita Gamma, MD as Consulting Physician (Neurosurgery) Suella Broad, MD as Consulting Physician (Physical Medicine and Rehabilitation) Pieter Partridge, DO as Consulting Physician (Neurology) Lavonna Monarch, MD as Consulting Physician (Dermatology) Leta Baptist, MD as Consulting Physician (Otolaryngology) Latanya Maudlin, MD as Consulting Physician  (Orthopedic Surgery) Cloria Spring, MD as Consulting Physician ([place]) Melina Schools, MD as Consulting Physician (Orthopedic Surgery) Lendon Colonel, NP as Nurse Practitioner (Nurse Practitioner) Milus Banister, MD as Attending Physician (Gastroenterology) Pyrtle, Lajuan Lines, MD as Consulting Physician (Gastroenterology) Monico Hoar, PT as Physical Therapist (Physical Therapy) Conrad Charlevoix, MD (Inactive) as Consulting Physician (Vascular Surgery) Inocencio Homes, DPM as Consulting Physician (Podiatry) Newt Minion, MD as Consulting Physician (Orthopedic Surgery) [name], RN as [place] Management    Assessment:   This is a routine wellness examination for [place].  Exercise Activities and Dietary recommendations    Goals    . Exercise 3x per week (30 min per time)     Recommend starting chair exercises at least 3 days a week for 30-45 minutes at a time as tolerated.      . Quit smoking / using tobacco       Fall Risk Fall Risk  04/29/2018 04/17/2018 04/09/2018 02/25/2018 01/31/2018  Falls in the past year? 0 0 1 0 1  Number falls in past yr: - - 1 - 1  Comment - - - - -  Injury with Fall? - 0 1 - 1  Comment - - - - -  Risk Factor Category  - - - - -  Risk for fall due to : - - History of fall(s);Impaired balance/gait;Impaired mobility;Mental status change - History of fall(s);Medication side effect;Impaired balance/gait;Impaired mobility;Impaired vision  Follow up - - - - Falls prevention discussed   Is the patient's home free of loose throw rugs in walkways, pet beds, electrical cords, etc?   yes      Grab bars in the bathroom? yes      Handrails on the stairs?   no      Adequate lighting?   yes  Timed Get Up and Go performed: unable to assess   Depression Screen PHQ 2/9 Scores 04/17/2018 01/31/2018 11/20/2017 10/08/2017  PHQ - 2 Score 0 1 3 3   PHQ- 9 Score - - 6 16  Some encounter information is confidential and restricted. Go to  Review Flowsheets activity to see all data.     Cognitive Function MMSE - Mini Mental State Exam 03/20/2016 09/08/2015 07/02/2014 12/08/2013  Not completed: (No Data) - - -  Orientation to time - 5 5 5   Orientation to Place - 3 5 5   Registration - 2 3 2  Attention/ Calculation - 5 3 5   Recall - 2 3 2   Language- name 2 objects - 2 2 2   Language- repeat - 0 1 1  Language- follow 3 step command - 3 3 3   Language- read & follow direction - 1 1 1   Write a sentence - 1 0 1  Copy design - 0 0 0  Total score - 24 26 27      6CIT Screen 01/31/2018 12/06/2016  What Year? 0 points 0 points  What month? 0 points 0 points  What time? 0 points 0 points  Count back from 20 0 points 0 points  Months in reverse 0 points 0 points  Repeat phrase 0 points 0 points  Total Score 0 0    Immunization History  Administered Date(s) Administered  . H1N1 01/09/2008  . Influenza Split 12/12/2010, 11/16/2011  . Influenza Whole 11/23/2006, 12/10/2007, 12/11/2008, 11/16/2009  . Influenza,inj,Quad PF,6+ Mos 11/28/2012, 11/20/2013, 11/10/2014, 12/06/2015, 11/21/2016, 11/20/2017  . Influenza,inj,quad, With Preservative 12/11/2016, 01/07/2018  . Pneumococcal Conjugate-13 09/16/2013  . Pneumococcal Polysaccharide-23 08/03/2003, 12/11/2008, 08/22/2010, 08/31/2015  . Td 09/23/2003  . Tdap 12/29/2011    Qualifies for Shingles Vaccine? Yes  Screening Tests Health Maintenance  Topic Date Due  . HEMOGLOBIN A1C  08/20/2018  . INFLUENZA VACCINE  10/12/2018  . FOOT EXAM  11/26/2018  . OPHTHALMOLOGY EXAM  05/01/2019  . MAMMOGRAM  09/11/2019  . COLONOSCOPY  02/09/2021  . TETANUS/TDAP  12/28/2021  . DEXA SCAN  Completed  . Hepatitis C Screening  Completed  . PNA vac Low Risk Adult  Completed    Cancer Screenings: Lung: Low Dose CT Chest recommended if Age 39-80 years, 30 pack-year currently smoking OR have quit w/in 15years. Patient does qualify. Breast:  Up to date on Mammogram? Yes   Up to date of Bone  Density/Dexa? Yes Colorectal: Due in 2022  Additional Screenings: : Hepatitis C Screening: 2016-negative      Plan:     1. Encounter for Medicare annual wellness exam  I have personally reviewed and noted the following in the patient's chart:   . Medical and social history . Use of alcohol, tobacco or illicit drugs  . Current medications and supplements . Functional ability and status . Nutritional status . Physical activity . Advanced directives . List of other physicians . Hospitalizations, surgeries, and ER visits in previous 12 months . Vitals . Screenings to include cognitive, depression, and falls . Referrals and appointments  In addition, I have reviewed and discussed with patient certain preventive protocols, quality metrics, and best practice recommendations. A written personalized care plan for preventive services as well as general preventive health recommendations were provided to patient.   I provided 30 minutes of non-face-to-face time during this encounter.  Perlie Mayo, NP 06/12/2018

## 2018-06-13 ENCOUNTER — Ambulatory Visit: Payer: Self-pay | Admitting: Cardiovascular Disease

## 2018-06-13 ENCOUNTER — Telehealth: Payer: Self-pay | Admitting: *Deleted

## 2018-06-13 NOTE — Telephone Encounter (Signed)
Pt called stating she needed a new CPAP she said she could get it if someone would send in her prior authorization to [facility] and send a prescription to [facility] please advise

## 2018-06-13 NOTE — Telephone Encounter (Signed)
If she is asking for prior authorization then that needs to come from the original prescriber of the CPAP machine which is her pulmonary dr.

## 2018-06-15 ENCOUNTER — Other Ambulatory Visit: Payer: Self-pay | Admitting: Internal Medicine

## 2018-06-18 ENCOUNTER — Telehealth: Payer: Self-pay | Admitting: *Deleted

## 2018-06-18 ENCOUNTER — Telehealth: Payer: Self-pay | Admitting: Internal Medicine

## 2018-06-18 MED ORDER — UMECLIDINIUM-VILANTEROL 62.5-25 MCG/INH IN AEPB: 1.0000 | INHALATION_SPRAY | Freq: Every day | RESPIRATORY_TRACT | 5 refills | Status: DC

## 2018-06-18 NOTE — Telephone Encounter (Signed)
Contacted patient re: cpap needing authorization.  States her machine was not working properly and [facility] had attempted to repair machine but was unable.  Patient states she spoke to [facility] about this issue and believed they would approve a new machine but was unaware she needed to wait until June.    After contacting [place] at [facility], it was confirmed that the patient did not need a new authorization for a new cpap because [facility] had already contacted [facility] and was told they would not support a new machine until June 2020.    Patient insists someone from [facility] told her they would pay for a new machine now but did not have a contact name at [facility].  Advised patient to call [employer] back to get assistance or clarification with this issue, as [name] says a new authorization for cpap would not help.  Patient could clarify this with [facility] and perhaps get a contact for [facility] to discuss this with them, if indeed they would approve a new machine.    Patient also wants Rx for Anoro as she used sample and felt it was beneficial.  Anoro order placed to [facility] per patient request.   Nothing further needed at this time.

## 2018-06-18 NOTE — Telephone Encounter (Signed)
Pt called wanted to know if something could be called in for the pain in her left arm. She said that she could not go to orthopedic dr due to them not seeing pts due to corona.

## 2018-06-18 NOTE — Telephone Encounter (Signed)
Pt notified to call [name] pulmonologist

## 2018-06-19 ENCOUNTER — Other Ambulatory Visit: Payer: Self-pay

## 2018-06-19 ENCOUNTER — Ambulatory Visit (INDEPENDENT_AMBULATORY_CARE_PROVIDER_SITE_OTHER): Payer: Medicare HMO | Admitting: Psychiatry

## 2018-06-19 DIAGNOSIS — F331 Major depressive disorder, recurrent, moderate: Secondary | ICD-10-CM

## 2018-06-19 NOTE — Progress Notes (Signed)
Virtual Visit via Telephone Note  I connected with [name] on 06/19/18 at 11:07  AM EDT  by telephone and verified that I am speaking with the correct person using two identifiers.   I discussed the limitations, risks, security and privacy concerns of performing an evaluation and management service by telephone and the availability of in person appointments. I also discussed with the patient that there may be a patient responsible charge related to this service. The patient expressed understanding and agreed to proceed.    I provided  46  minutes of non-face-to-face time during this encounter.   [name], LCSW    THERAPIST PROGRESS NOTE  Session Time: Wednesday 06/19/2018 11:07 AM -   11:53 AM         Participation Level: Active  Behavioral Response: CasualAlertDepressed  Type of Therapy: Individual Therapy  Treatment Goals addressed: Learn and implement behavioral strategies to overcome depression.                                                       Verbalize an understanding and resolution of current interpersonal problems.   Interventions: CBT and Supportive  Summary: [name] is a 68 y.o. female who presents with  long standing history of recurrent periods  of depression beginning when she was 83 and her favorite uncle died. Patient reports multiple psychiatric hospitlaizations due to depression and suicidal ideations with the last one occuring in 1997. Patient has participated in outpatient psychotherapy and medication management intermittently since age 38. She currently is seeing psychiatrist Dr. Harrington Challenger . Prior to this, she was seen at [facility]. Patient also has had ECT at [facility]. Symptoms have worsened in recent months due to family stress and have  included depressed mood, anxiety, excessive worry, and tearfulness.   Patient last was seen about four weeks ago. She reports increased stress related to conflict with her aide. She expresses dissatisfaction  and frustration regarding aide's job performance. She also reports mistrust and lack of confidence in aide's abilities based on experience with the aide. Patient reports becoming angry with aide but using deep breathing to calm self before speaking with aide about her concerns. Patient also has talked with owner of agency where aide is employed. She says she has requested a replacement for aide but was informed there isn't another person available at this time. She reports recent incident with her son where she became angry. She is pleased she used deep breathing again to calm self before she responded to son. Patient also reports she has used imagery to try to relax.   Suicidal/Homicidal: Nowithout intent/plan  Therapist Response: Discussed stressors, facilitated expression of thoughts and feelings, validated feelings, praised and reinforced patient's use of calming techniques, discussed effects, assisted patient explore her options regarding replacement for aide ( talk with owner of agency again, contact case manager), discussed ways patient can improve assertive communication through the use of "I messages with the aide, reviewed rationale for practicing relaxation techniques regularly as well as using as an intervention, assigned patient to practice relaxation techniques daily.   Plan: Return again in 2 weeks.  Diagnosis: Axis I: MDD, Recurrent, moderate    Axis II: Deferred    [name], LCSW 06/19/2018

## 2018-06-19 NOTE — Telephone Encounter (Signed)
Do you want me to see if I can get her set up with a phone visit with ortho or do you want one with you?

## 2018-06-19 NOTE — Telephone Encounter (Signed)
I recommend  ortho visit by tele, already on pain contract for chronic pain, pls explain and refer I will sign

## 2018-06-20 ENCOUNTER — Telehealth: Payer: Self-pay | Admitting: Internal Medicine

## 2018-06-20 DIAGNOSIS — G4733 Obstructive sleep apnea (adult) (pediatric): Secondary | ICD-10-CM

## 2018-06-20 NOTE — Telephone Encounter (Signed)
Spoke with pt she is aware said she normally sees Dr. Veverly Fells

## 2018-06-20 NOTE — Telephone Encounter (Signed)
Call returned to patient, she is requesting that we send in a order for her cpap to [facility]. She states she spoke with a rep from [facility] and they told her to send the order to [facility]. Patient unable to recall who she spoke with. I advised patient there is extensive documentation stating she is not eligible for a cpap until June. She was very insistent that someone from [facility] told her they would be authorized. Made patient aware per last AVS patient was supposed to get a new machine. Order sent. Nothing further needed at this time.

## 2018-06-24 DIAGNOSIS — E1142 Type 2 diabetes mellitus with diabetic polyneuropathy: Secondary | ICD-10-CM | POA: Diagnosis not present

## 2018-06-24 DIAGNOSIS — F17210 Nicotine dependence, cigarettes, uncomplicated: Secondary | ICD-10-CM | POA: Diagnosis not present

## 2018-06-24 DIAGNOSIS — J449 Chronic obstructive pulmonary disease, unspecified: Secondary | ICD-10-CM | POA: Diagnosis not present

## 2018-06-24 DIAGNOSIS — I110 Hypertensive heart disease with heart failure: Secondary | ICD-10-CM | POA: Diagnosis not present

## 2018-06-24 DIAGNOSIS — Z794 Long term (current) use of insulin: Secondary | ICD-10-CM | POA: Diagnosis not present

## 2018-06-24 DIAGNOSIS — Z79899 Other long term (current) drug therapy: Secondary | ICD-10-CM | POA: Diagnosis not present

## 2018-06-24 DIAGNOSIS — I509 Heart failure, unspecified: Secondary | ICD-10-CM | POA: Diagnosis not present

## 2018-06-24 DIAGNOSIS — F250 Schizoaffective disorder, bipolar type: Secondary | ICD-10-CM | POA: Diagnosis not present

## 2018-06-24 DIAGNOSIS — I69354 Hemiplegia and hemiparesis following cerebral infarction affecting left non-dominant side: Secondary | ICD-10-CM | POA: Diagnosis not present

## 2018-06-25 ENCOUNTER — Ambulatory Visit: Payer: Medicare HMO | Admitting: Podiatry

## 2018-06-28 ENCOUNTER — Telehealth: Payer: Self-pay | Admitting: Family Medicine

## 2018-06-28 ENCOUNTER — Ambulatory Visit: Payer: Medicare HMO | Admitting: Podiatry

## 2018-06-28 DIAGNOSIS — E785 Hyperlipidemia, unspecified: Secondary | ICD-10-CM | POA: Diagnosis not present

## 2018-06-28 DIAGNOSIS — G4733 Obstructive sleep apnea (adult) (pediatric): Secondary | ICD-10-CM | POA: Diagnosis not present

## 2018-06-28 NOTE — Telephone Encounter (Signed)
Pt is calling to advise that her Blood sugar is running a little higher, 250 to 300 And she has a runny nose   We did sing Happy Rudene Anda to her

## 2018-07-01 ENCOUNTER — Ambulatory Visit: Payer: Medicare HMO | Admitting: Family Medicine

## 2018-07-01 ENCOUNTER — Other Ambulatory Visit: Payer: Self-pay

## 2018-07-01 DIAGNOSIS — Z9181 History of falling: Secondary | ICD-10-CM | POA: Diagnosis not present

## 2018-07-01 DIAGNOSIS — E119 Type 2 diabetes mellitus without complications: Secondary | ICD-10-CM | POA: Diagnosis not present

## 2018-07-01 DIAGNOSIS — I69359 Hemiplegia and hemiparesis following cerebral infarction affecting unspecified side: Secondary | ICD-10-CM | POA: Diagnosis not present

## 2018-07-01 DIAGNOSIS — M6281 Muscle weakness (generalized): Secondary | ICD-10-CM | POA: Diagnosis not present

## 2018-07-03 ENCOUNTER — Other Ambulatory Visit: Payer: Self-pay

## 2018-07-03 ENCOUNTER — Encounter: Payer: Self-pay | Admitting: Family Medicine

## 2018-07-03 ENCOUNTER — Ambulatory Visit (INDEPENDENT_AMBULATORY_CARE_PROVIDER_SITE_OTHER): Payer: Medicare HMO | Admitting: Family Medicine

## 2018-07-03 VITALS — BP 142/70 | HR 72 | Resp 17 | Ht 59.0 in | Wt 173.1 lb

## 2018-07-03 DIAGNOSIS — Z794 Long term (current) use of insulin: Secondary | ICD-10-CM | POA: Diagnosis not present

## 2018-07-03 DIAGNOSIS — I10 Essential (primary) hypertension: Secondary | ICD-10-CM

## 2018-07-03 DIAGNOSIS — R29898 Other symptoms and signs involving the musculoskeletal system: Secondary | ICD-10-CM

## 2018-07-03 DIAGNOSIS — M160 Bilateral primary osteoarthritis of hip: Secondary | ICD-10-CM | POA: Diagnosis not present

## 2018-07-03 DIAGNOSIS — M4716 Other spondylosis with myelopathy, lumbar region: Secondary | ICD-10-CM | POA: Diagnosis not present

## 2018-07-03 DIAGNOSIS — F17210 Nicotine dependence, cigarettes, uncomplicated: Secondary | ICD-10-CM | POA: Diagnosis not present

## 2018-07-03 DIAGNOSIS — E1165 Type 2 diabetes mellitus with hyperglycemia: Secondary | ICD-10-CM

## 2018-07-03 DIAGNOSIS — R21 Rash and other nonspecific skin eruption: Secondary | ICD-10-CM | POA: Diagnosis not present

## 2018-07-03 DIAGNOSIS — Z720 Tobacco use: Secondary | ICD-10-CM

## 2018-07-03 MED ORDER — CLOTRIMAZOLE-BETAMETHASONE 1-0.05 % EX CREA: TOPICAL_CREAM | CUTANEOUS | 0 refills | Status: DC

## 2018-07-03 NOTE — Patient Instructions (Addendum)
F/U mid October, call if you need me before   Craeam prescribed twice daily for 1 week for rash on the face   Script for lift chair will be sent to [facility] with dx of weakness in lower extremities  Please set  a quit date for smoking and call [phone] every day too get help with this  Social distancing. Frequent hand washing with soap and water Keeping your hands off of your face.Wear a mask always when you go out These 3 practices will help to keep both you and your community healthy during this time. Please practice them faithfully!       Steps to Quit Smoking  Smoking tobacco can be bad for your health. It can also affect almost every organ in your body. Smoking puts you and people around you at risk for many serious long-lasting (chronic) diseases. Quitting smoking is hard, but it is one of the best things that you can do for your health. It is never too late to quit. What are the benefits of quitting smoking? When you quit smoking, you lower your risk for getting serious diseases and conditions. They can include:  Lung cancer or lung disease.  Heart disease.  Stroke.  Heart attack.  Not being able to have children (infertility).  Weak bones (osteoporosis) and broken bones (fractures). If you have coughing, wheezing, and shortness of breath, those symptoms may get better when you quit. You may also get sick less often. If you are pregnant, quitting smoking can help to lower your chances of having a baby of low birth weight. What can I do to help me quit smoking? Talk with your doctor about what can help you quit smoking. Some things you can do (strategies) include:  Quitting smoking totally, instead of slowly cutting back how much you smoke over a period of time.  Going to in-person counseling. You are more likely to quit if you go to many counseling sessions.  Using resources and support systems, such as: ? Database administrator with a Social worker. ? Phone  quitlines. ? Careers information officer. ? Support groups or group counseling. ? Text messaging programs. ? Mobile phone apps or applications.  Taking medicines. Some of these medicines may have nicotine in them. If you are pregnant or breastfeeding, do not take any medicines to quit smoking unless your doctor says it is okay. Talk with your doctor about counseling or other things that can help you. Talk with your doctor about using more than one strategy at the same time, such as taking medicines while you are also going to in-person counseling. This can help make quitting easier. What things can I do to make it easier to quit? Quitting smoking might feel very hard at first, but there is a lot that you can do to make it easier. Take these steps:  Talk to your family and friends. Ask them to support and encourage you.  Call phone quitlines, reach out to support groups, or work with a Social worker.  Ask people who smoke to not smoke around you.  Avoid places that make you want (trigger) to smoke, such as: ? Bars. ? Parties. ? Smoke-break areas at work.  Spend time with people who do not smoke.  Lower the stress in your life. Stress can make you want to smoke. Try these things to help your stress: ? Getting regular exercise. ? Deep-breathing exercises. ? Yoga. ? Meditating. ? Doing a body scan. To do this, close your eyes, focus on  one area of your body at a time from head to toe, and notice which parts of your body are tense. Try to relax the muscles in those areas.  Download or buy apps on your mobile phone or tablet that can help you stick to your quit plan. There are many free apps, such as QuitGuide from the [employer] Office manager for Disease Control and Prevention). You can find more support from smokefree.gov and other websites. This information is not intended to replace advice given to you by your health care provider. Make sure you discuss any questions you have with your health care  provider. Document Released: 12/24/2008 Document Revised: 10/26/2015 Document Reviewed: 07/14/2014 Elsevier Interactive Patient Education  2019 [employer].

## 2018-07-03 NOTE — Telephone Encounter (Signed)
Pt evaluated in office today

## 2018-07-05 ENCOUNTER — Encounter: Payer: Self-pay | Admitting: Internal Medicine

## 2018-07-05 ENCOUNTER — Ambulatory Visit (INDEPENDENT_AMBULATORY_CARE_PROVIDER_SITE_OTHER): Payer: Medicare HMO | Admitting: Internal Medicine

## 2018-07-05 ENCOUNTER — Other Ambulatory Visit: Payer: Self-pay

## 2018-07-05 DIAGNOSIS — F060 Psychotic disorder with hallucinations due to known physiological condition: Secondary | ICD-10-CM | POA: Diagnosis not present

## 2018-07-05 DIAGNOSIS — G4733 Obstructive sleep apnea (adult) (pediatric): Secondary | ICD-10-CM

## 2018-07-05 DIAGNOSIS — J449 Chronic obstructive pulmonary disease, unspecified: Secondary | ICD-10-CM

## 2018-07-05 DIAGNOSIS — Z720 Tobacco use: Secondary | ICD-10-CM | POA: Diagnosis not present

## 2018-07-05 MED ORDER — TIOTROPIUM BROMIDE-OLODATEROL 2.5-2.5 MCG/ACT IN AERS: 2.0000 | INHALATION_SPRAY | Freq: Every day | RESPIRATORY_TRACT | 3 refills | Status: DC

## 2018-07-05 MED ORDER — ALBUTEROL SULFATE HFA 108 (90 BASE) MCG/ACT IN AERS: INHALATION_SPRAY | RESPIRATORY_TRACT | 3 refills | Status: DC

## 2018-07-05 NOTE — Assessment & Plan Note (Signed)
Cessation emphasized

## 2018-07-05 NOTE — Progress Notes (Signed)
HPI F Former smoker followed for obstructive sleep apnea with hypersomnia, , R/O'd Lung nodule,Wheezing dyspnea, complicated by hx resection of meningioma. Bipolar, GERD, DM, chronic epistaxis NPSG 3/8/13[name]- mild obstructive sleep apnea, AHI 14 per hour, mostly in REM. Epworth sleepiness score 14/24. Weight 164 pounds. She is living alone, divorced and disabled. Son has OSA. History of meningioma 11/23/2011. No seizure in over 3 years. Does not drive Office Spirometry 10/30/2013-submaximal effort based on appearance of loop and curve. Numbers would fit with severe restriction but her physiologic capability may be better than this. FVC 0.91/44%, FEV1 0.74/45%, FEV1/FVC 0.81, FEF 25-75% 1.43/69%. Office Spirometry 07/27/2015-weak effort but still better than her last previous trial effort. Mild restriction of exhaled volume. FVC 1.35/67%, FEV1 1.27/79%, FEV1/FVC 0.94, FEF 25-75 percent 2.14/107% -------------------------------------------------------------------------------------------------------------  02/25/18- 67 yoF smoker followed for obstructive sleep apnea with hypersomnia, , R/O'd Lung nodule,Wheezing dyspnea, complicated by hx resection of meningioma. Bipolar, GERD, DM, chronic epistaxis CPAP 5-15 auto/ [facility] Download 10% compliance AHI 9.4/hour Admits to smoking 5-10 cigarettes daily.  Has Chantix but is trying to stop without using it. -----OSA: DME [facility]. According to most recent DL (attached) pt is not wearing CPAP as she should. Pt states she has not been contacted by RT at [facility] to help with her machine.  She says her CPAP machine cuts off during the night. Cauterized epistaxis.  Dyspnea on exertion is about at baseline with little cough. Says she has another brain tumor-presumably meningioma again.  Being followed by Hematology for anemia. CT chest 01/23/2018- Scattered atherosclerotic calcifications in the aorta and coronary arteries. Scattered atelectasis in both lungs  without acute infiltrate. No acute intrathoracic abnormalities. Aortic Atherosclerosis (ICD10-I70.0).   ROS-see HPI + = positive Constitutional:   No-   weight loss, night sweats, fevers, chills, +fatigue, lassitude. HEENT:  + headaches, +difficulty swallowing, tooth/dental problems, sore throat,       No-sneezing, itching, ear ache, nasal congestion, post nasal drip,  CV:  chest pain, orthopnea, PND, swelling in lower extremities, anasarca, dizziness, +palpitations Resp: +  shortness of breath with exertion or at rest.                productive cough,   non-productive cough,  No- coughing up of blood.              No-   change in color of mucus.   wheezing.   Skin: No-   rash or lesions. GI:  +heartburn, +indigestion, no-abdominal pain, nausea, vomiting,  GU:  MS:  + joint pain or swelling.   Neuro-     nothing unusual Psych:  No- change in mood or affect. +depression or +anxiety.  No memory loss.  OBJ- Physical Exam - + she speaks slowly but seems alert and appropriate, neatly dressed today General-  Oriented, Affect-flat, Distress- none acute Skin- rash-none, lesions- none, excoriation- none Lymphadenopathy- none Head- Eyes- Gross vision intact, PERRLA, conjunctivae and secretions clear            Ears- Hearing, canals-normal + = positive            Nose- no-Septal dev, polyps, erosion, perforation             Throat- Mallampati III , mucosa clear , drainage- none, tonsils- atrophic, + dentures Neck- flexible , trachea midline, no stridor , thyroid nl, carotid no bruit Chest - symmetrical excursion , unlabored           Heart/CV- RRR , no murmur , no  gallop  , no rub, nl s1 s2                           - JVD- none , edema- none, stasis changes- none, varices- none           Lung- clear to P&A, wheeze- none, cough- mild , dullness-none, rub- none           Chest wall-  Abd-  Br/ Gen/ Rectal- Not done, not indicated Extrem- cyanosis- none, clubbing, none, atrophy- none,  strength- nl. +cane Neuro- + seems fully oriented but vague. Rocking in chair.   07/05/2018- Virtual Visit via Telephone Note  I connected with [name] on 07/05/18 at 11:00 AM EDT by telephone and verified that I am speaking with the correct person using two identifiers.   I discussed the limitations, risks, security and privacy concerns of performing an evaluation and management service by telephone and the availability of in person appointments. I also discussed with the patient that there may be a patient responsible charge related to this service. The patient expressed understanding and agreed to proceed.   History of Present Illness: 62 yoF smoker followed for obstructive sleep apnea with hypersomnia, , R/O'd Lung nodule,Wheezing dyspnea/ tobacco, complicated by hx resection of meningioma. Bipolar, GERD, DM, chronic epistaxis  CPAP 5-15 auto/ [facility] [name])  She just got replacement machine to restart on April 17, so download not yet available. She feels current mask is too big, and has asked for better fit. Occasional wheeze- not bad. She says her grip is not strong enough to activate Anoro, but ok with Ventolin. I went back over this issue with her, but she was quite clear that she has trouble activating Anoro. Smoking 5 cigs/ day   Observations/Objective: NPSG 3/8/13[name]- mild obstructive sleep apnea, AHI 14 per hour, mostly in REM. Epworth sleepiness score 14/24. Weight 164 pounds. She is living alone, divorced and disabled. Son has OSA. History of meningioma 11/23/2011. No seizure in over 3 years. Does not drive Office Spirometry 10/30/2013-submaximal effort based on appearance of loop and curve. Numbers would fit with severe restriction but her physiologic capability may be better than this. FVC 0.91/44%, FEV1 0.74/45%, FEV1/FVC 0.81, FEF 25-75% 1.43/69%. Office Spirometry 07/27/2015-weak effort but still better than her last previous trial effort. Mild restriction of  exhaled volume. FVC 1.35/67%, FEV1 1.27/79%, FEV1/FVC 0.94, FEF 25-75 percent 2.14/107% Over phone, her speech is slurred and difficult to understand, just as when she has been seen before in office, but she seems to be thinking clearly.   Assessment and Plan: OSA- has her new machine. May need mask refit ([facility]/ [employer] Everywhere) Wheezing dyspnea/ asthma/ COPD- Try Stiolto instead of Anoro for ease of use.  Follow Up Instructions: 4 months   I discussed the assessment and treatment plan with the patient. The patient was provided an opportunity to ask questions and all were answered. The patient agreed with the plan and demonstrated an understanding of the instructions.   The patient was advised to call back or seek an in-person evaluation if the symptoms worsen or if the condition fails to improve as anticipated.  I provided 22 minutes of non-face-to-face time during this encounter.   Baird Lyons, MD

## 2018-07-05 NOTE — Patient Instructions (Addendum)
Script sent to replace Anoro inhaler (which seems to be too hard to operate) with  Stiolto Respimat inhaler    Inhale 2 puffs, once daily  Order DME [facility] continue CPAP auto 5-15, mask of choice, humidifier, supplies, AirView/ card.  Please work with patient to refit mask as needed for better fit.   We will contact [facility] for a download so we can see how the machine is working.  Refill script sent for albuterol rescue inhaler

## 2018-07-05 NOTE — Assessment & Plan Note (Signed)
We are replacing Anoro with Stiolto to see if it is easier for her to use. Hopefully it isn't just that she was expecting Anoro to work like a press and breathe MDI, because she insisted she could use her ventolin ok.

## 2018-07-05 NOTE — Assessment & Plan Note (Signed)
Just started back with new machine. We spoke with [facility] confirming she was established with them. May need mask refit- she says it seems too big. Continue auto 5-15. Will be able to check download through [facility]. I reviewed compliance goals.

## 2018-07-05 NOTE — Assessment & Plan Note (Signed)
Speech is always slurred. Known hx of meningioma resection. Difficult for me to know how much organic, psychiatric, and medication induced problem she has, interfering with comprehension and communication.

## 2018-07-05 NOTE — Progress Notes (Signed)
[name]     MRN: [id number]      DOB: 1950-10-23   HPI [name] is here for follow up and re-evaluation of chronic medical conditions, medication management and review of any available recent lab and radiology data.  Preventive health is updated, specifically  Cancer screening and Immunization.   Questions or concerns regarding consultations or procedures which the PT has had in the interim are  addressed. The PT denies any adverse reactions to current medications since the last visit.  1 week h/o pruritic rash on forehead x  1 week, denies any new products , either detergents or personal kin care products or new food or drugs C/O chronic uncontrolled left shoulder and upper arm pain C/o difficulty rising from standard chair in her home and elsewhere due to lower extremity weakness from muscle weakness due to underlying lumbar arthritis with disc disease. Need for lift chair for use in her home to improve her independence and safety She also has  established  arthritis in her hips from imaging study, MRI , in 2015 , which incrases her instability and difficulty in raising up from a chair Denies polyuria, polydipsia, blurred vision , or hypoglycemic episodes.   ROS Denies recent fever or chills. Denies sinus pressure, nasal congestion, ear pain or sore throat. Denies chest congestion, productive cough or wheezing. Denies chest pains, palpitations and leg swelling Denies abdominal pain, nausea, vomiting,diarrhea or constipation.   Denies dysuria, frequency, hesitancy or incontinence. C/o weakness and nub ness in lower extremities. Denies uncontrolled depression, anxiety or insomnia.    PE  BP (!) 142/70   Pulse 72   Resp 17   Ht 4\' 11"  (1.499 m)   Wt 173 lb 1.9 oz (78.5 kg)   SpO2 99%   BMI 34.97 kg/m   Patient alert and oriented and in no cardiopulmonary distress.  HEENT: No facial asymmetry, EOMI,   oropharynx pink and moist.  Neck decreased ROM no JVD, no mass.   Chest: Clear to auscultation bilaterally.decreased air entry  CVS: S1, S2 no murmurs, no S3.Regular rate.  ABD: Soft non tender.   Ext: No edema  MB:[id number]  ROM lumbar  spine, and , hips and left shoulder and neck  Skin: Intact, erythematous maculopapular rash on forehead noted.  Psych: Good eye contact, normal affect. Memory intact not anxious or depressed appearing.  CNS: CN 2-12 intact, grade  4 power noted in lower extremities   Assessment & Plan  Tobacco user Asked:confirms currently smokes cigarettes Assess: Unwilling to quit but cutting back Advise: needs to QUIT to reduce risk of cancer, cardio and cerebrovascular disease Assist: counseled for 5 minutes and literature provided Arrange: follow up in 3 months   Lumbar spondylosis with myelopathy Increased difficulty raising up from standard chair , even with arm rests to help to propel herself upward, need for lift chair in her home for safety and independence and improved quality of life established  Essential hypertension DASH diet and commitment to daily physical activity for a minimum of 30 minutes discussed and encouraged, as a part of hypertension management. The importance of attaining a healthy weight is also discussed.  BP/Weight 07/03/2018 05/03/2018 04/17/2018 04/09/2018 04/02/2018 09/11/7791 9/0/3009  Systolic BP 233 007 622 633 354 562 563  Diastolic BP 70 34 68 60 55 52 58  Wt. (Lbs) 173.12 168 163 163 - - 164  BMI 34.97 33.93 32.92 32.92 - - 33.12  Some encounter information is confidential and restricted. Go  to Review Flowsheets activity to see all data.  no medication change at this time though not at goal at this visit, but more recent visits have low BP     Osteoarthritis of both hips established disease from MRI several years ago and pt has increased c/o pain and difficulty rising up from chair, needs lift chair for assistance   Rash 1 week h/o pruritic rash on forehead , no identifying trigger,  clotrimaz/ betameth prescribed, appears to be both due to mild eczema  With fungal infection also  Type 2 diabetes mellitus with hyperglycemia, with long-term current use of insulin (HCC) Controlled, no change in medication Managed by endo [name] is reminded of the importance of commitment to daily physical activity for 30 minutes or more, as able and the need to limit carbohydrate intake to 30 to 60 grams per meal to help with blood sugar control.   The need to take medication as prescribed, test blood sugar as directed, and to call between visits if there is a concern that blood sugar is uncontrolled is also discussed.   [name] is reminded of the importance of daily foot exam, annual eye examination, and good blood sugar, blood pressure and cholesterol control.  Diabetic Labs Latest Ref Rng & Units 04/26/2018 02/18/2018 02/15/2018 02/11/2018 01/29/2018  HbA1c 4.0 - 5.6 % - 5.1 - - -  Microalbumin <2.0 mg/dL - - - - -  Micro/Creat Ratio 0.0 - 30.0 mg/g - - - - -  Chol 100 - 199 mg/dL - - - 143 -  HDL >39 mg/dL - - - 53 -  Calc LDL 0 - 99 mg/dL - - - 72 -  Triglycerides 0 - 149 mg/dL - - - 89 -  Creatinine 0.44 - 1.00 mg/dL 1.02(H) - 0.89 - 1.17(H)  GFR >60.00 mL/min - - - - -   BP/Weight 07/03/2018 05/03/2018 04/17/2018 04/09/2018 04/02/2018 3/41/9622 04/22/7987  Systolic BP 211 941 740 814 481 856 314  Diastolic BP 70 34 68 60 55 52 58  Wt. (Lbs) 173.12 168 163 163 - - 164  BMI 34.97 33.93 32.92 32.92 - - 33.12  Some encounter information is confidential and restricted. Go to Review Flowsheets activity to see all data.   Foot/eye exam completion dates Latest Ref Rng & Units 04/30/2018 11/20/2017  Eye Exam No Retinopathy Retinopathy(A) -  Foot exam Order - - -  Foot Form Completion - - Done

## 2018-07-06 ENCOUNTER — Encounter: Payer: Self-pay | Admitting: Family Medicine

## 2018-07-06 DIAGNOSIS — M160 Bilateral primary osteoarthritis of hip: Secondary | ICD-10-CM | POA: Insufficient documentation

## 2018-07-06 NOTE — Assessment & Plan Note (Signed)
Increased difficulty raising up from standard chair , even with arm rests to help to propel herself upward, need for lift chair in her home for safety and independence and improved quality of life established

## 2018-07-06 NOTE — Assessment & Plan Note (Signed)
1 week h/o pruritic rash on forehead , no identifying trigger, clotrimaz/ betameth prescribed, appears to be both due to mild eczema  With fungal infection also

## 2018-07-06 NOTE — Assessment & Plan Note (Signed)
established disease from MRI several years ago and pt has increased c/o pain and difficulty rising up from chair, needs lift chair for assistance

## 2018-07-06 NOTE — Assessment & Plan Note (Signed)
DASH diet and commitment to daily physical activity for a minimum of 30 minutes discussed and encouraged, as a part of hypertension management. The importance of attaining a healthy weight is also discussed.  BP/Weight 07/03/2018 05/03/2018 04/17/2018 04/09/2018 04/02/2018 03/27/5206 0/04/2334  Systolic BP 122 449 753 005 110 211 173  Diastolic BP 70 34 68 60 55 52 58  Wt. (Lbs) 173.12 168 163 163 - - 164  BMI 34.97 33.93 32.92 32.92 - - 33.12  Some encounter information is confidential and restricted. Go to Review Flowsheets activity to see all data.  no medication change at this time though not at goal at this visit, but more recent visits have low BP

## 2018-07-06 NOTE — Assessment & Plan Note (Signed)
Controlled, no change in medication Managed by endo [name] is reminded of the importance of commitment to daily physical activity for 30 minutes or more, as able and the need to limit carbohydrate intake to 30 to 60 grams per meal to help with blood sugar control.   The need to take medication as prescribed, test blood sugar as directed, and to call between visits if there is a concern that blood sugar is uncontrolled is also discussed.   [name] is reminded of the importance of daily foot exam, annual eye examination, and good blood sugar, blood pressure and cholesterol control.  Diabetic Labs Latest Ref Rng & Units 04/26/2018 02/18/2018 02/15/2018 02/11/2018 01/29/2018  HbA1c 4.0 - 5.6 % - 5.1 - - -  Microalbumin <2.0 mg/dL - - - - -  Micro/Creat Ratio 0.0 - 30.0 mg/g - - - - -  Chol 100 - 199 mg/dL - - - 143 -  HDL >39 mg/dL - - - 53 -  Calc LDL 0 - 99 mg/dL - - - 72 -  Triglycerides 0 - 149 mg/dL - - - 89 -  Creatinine 0.44 - 1.00 mg/dL 1.02(H) - 0.89 - 1.17(H)  GFR >60.00 mL/min - - - - -   BP/Weight 07/03/2018 05/03/2018 04/17/2018 04/09/2018 04/02/2018 5/83/0940 09/16/8086  Systolic BP 110 315 945 859 292 446 286  Diastolic BP 70 34 68 60 55 52 58  Wt. (Lbs) 173.12 168 163 163 - - 164  BMI 34.97 33.93 32.92 32.92 - - 33.12  Some encounter information is confidential and restricted. Go to Review Flowsheets activity to see all data.   Foot/eye exam completion dates Latest Ref Rng & Units 04/30/2018 11/20/2017  Eye Exam No Retinopathy Retinopathy(A) -  Foot exam Order - - -  Foot Form Completion - - Done

## 2018-07-06 NOTE — Assessment & Plan Note (Signed)
Asked:confirms currently smokes cigarettes Assess: Unwilling to quit but cutting back Advise: needs to QUIT to reduce risk of cancer, cardio and cerebrovascular disease Assist: counseled for 5 minutes and literature provided Arrange: follow up in 3 months  

## 2018-07-08 ENCOUNTER — Telehealth: Payer: Self-pay | Admitting: Internal Medicine

## 2018-07-08 DIAGNOSIS — I69354 Hemiplegia and hemiparesis following cerebral infarction affecting left non-dominant side: Secondary | ICD-10-CM | POA: Diagnosis not present

## 2018-07-08 DIAGNOSIS — F17210 Nicotine dependence, cigarettes, uncomplicated: Secondary | ICD-10-CM | POA: Diagnosis not present

## 2018-07-08 DIAGNOSIS — Z79899 Other long term (current) drug therapy: Secondary | ICD-10-CM | POA: Diagnosis not present

## 2018-07-08 DIAGNOSIS — F250 Schizoaffective disorder, bipolar type: Secondary | ICD-10-CM | POA: Diagnosis not present

## 2018-07-08 DIAGNOSIS — I110 Hypertensive heart disease with heart failure: Secondary | ICD-10-CM | POA: Diagnosis not present

## 2018-07-08 DIAGNOSIS — I509 Heart failure, unspecified: Secondary | ICD-10-CM | POA: Diagnosis not present

## 2018-07-08 DIAGNOSIS — Z794 Long term (current) use of insulin: Secondary | ICD-10-CM | POA: Diagnosis not present

## 2018-07-08 DIAGNOSIS — E1142 Type 2 diabetes mellitus with diabetic polyneuropathy: Secondary | ICD-10-CM | POA: Diagnosis not present

## 2018-07-08 DIAGNOSIS — J449 Chronic obstructive pulmonary disease, unspecified: Secondary | ICD-10-CM | POA: Diagnosis not present

## 2018-07-08 NOTE — Telephone Encounter (Signed)
Per [facility], "Caller states her blood sugar has been running high for over a week now, been as high as 291. This morning was 215. States her temperature is 99.8, states she feels cold, and has a cough that started last Wednesday and was seen by her primary care and told it was allergies and she states she is having no worsening symptoms, just concerned about her blood sugar."  Placed in Doctors basket.

## 2018-07-09 ENCOUNTER — Other Ambulatory Visit: Payer: Self-pay

## 2018-07-09 ENCOUNTER — Encounter: Payer: Self-pay | Admitting: Internal Medicine

## 2018-07-09 ENCOUNTER — Telehealth: Payer: Self-pay

## 2018-07-09 ENCOUNTER — Ambulatory Visit (INDEPENDENT_AMBULATORY_CARE_PROVIDER_SITE_OTHER): Payer: Medicare HMO | Admitting: Internal Medicine

## 2018-07-09 DIAGNOSIS — Z794 Long term (current) use of insulin: Secondary | ICD-10-CM | POA: Diagnosis not present

## 2018-07-09 DIAGNOSIS — E1165 Type 2 diabetes mellitus with hyperglycemia: Secondary | ICD-10-CM | POA: Diagnosis not present

## 2018-07-09 DIAGNOSIS — E042 Nontoxic multinodular goiter: Secondary | ICD-10-CM

## 2018-07-09 DIAGNOSIS — E039 Hypothyroidism, unspecified: Secondary | ICD-10-CM

## 2018-07-09 NOTE — Telephone Encounter (Signed)
Patient is requesting a refill of restasis

## 2018-07-09 NOTE — Progress Notes (Signed)
Patient ID: JUAQUINA MACHNIK, female   DOB: [birth year], 68 y.o.   MRN: [id number]  Patient location: Home My location: Office  Referring Provider: [name], MD  I connected with the patient on 07/09/18 at  3:19 PM EDT by telephone and verified that I am speaking with the correct person.   I discussed the limitations of evaluation and management by telephone and the availability of in person appointments. The patient expressed understanding and agreed to proceed.   Details of the encounter are shown below.  HPI: ALYLA PIETILA is a 68 y.o.-year-old female, initially referred by her PCP, [name], presenting for follow-up for DM2, dx 1995, insulin-dependent since 1997, uncontrolled, with complications (gastroparesis, cerebrovasc. Ds-  H/o stroke, PN),  Hypothyroidism, thyroid nodules.  Last visit 4 months ago.  She called [name] with the below message: Per [facility], "Caller states her blood sugar has been running high for over a week now, been as high as 291. This morning was 215.  Statesher temperature is 99.8, states she feels cold, and has a cough that started last Wednesday and was seen by her primary care and told it was allergies and she states she is having no worseningsymptoms, just concerned about her blood sugar."  She was recently on Prednisone (started 06/11/2018) for elbow and arm pain and ABx for a sinus infection. The courses lasted for 5 days >> sugars are elevated since then.  She normally gets steroid injections for the arm pain - trying to avoid these 2/2 hyperglycemia and osteoporosis. However, pain is intense >> she will return to see him.  DM2: Last hemoglobin A1c was: Lab Results  Component Value Date   HGBA1C 5.1 02/18/2018   HGBA1C 5.8 (A) 10/09/2017   HGBA1C 5.8 06/07/2017  10/28/2013: HbA1c 8.5%  She is on: - Toujeo 23 units at bedtime - Novolog:  10 units before a smaller meal  12 units before a regular meal  14 units before a larger meal Could not  tolerate Metformin >> nausea.  Insulin doses were decreased in 04/2017 due to low blood sugars in the 40s.  Pt checks her sugars 4 times a day: - am: 81-157, 200, 270 (UTI) >> 80-141, 425 (steroid inj) >> 91, 124, 160-177, 215 - 2h after b'fast: n/c >> 110 >> n/c - before lunch: 65, 74-144, 225, 247 (URI - ABx) >> 111, 136-196, 215, 245 - 2h after lunch: n/c >> 127-154, 229 >> n/c >> 115 >> n/c - before dinner: 95-151, 219 >> 92-145, 171, 218 >> 89-151 - 2h after dinner: n/c - bedtime: 79, 100-179, 211 >> 102-151, 166 >> 129, 155, 156 - nighttime: n/c Lowest sugar was 65 >> 89; she has hypoglycemia awareness in the 70s. Highest sugar was 425 >> 291.  Pt's meals are: - Breakfast: eggs, cereals, fruit, oatmeal, bacon - Lunch: sandwich, beef, mashed potatoes,diet soda - Dinner: chicken, beef, fish, potatoes, vegetables - Snacks: 1-2: fruit  -+ Mild CKD, last BUN/creatinine:  Lab Results  Component Value Date   BUN 28 (H) 04/26/2018   CREATININE 1.02 (H) 04/26/2018  On losartan. -+ HL; last set of lipids: Lab Results  Component Value Date   CHOL 143 02/11/2018   HDL 53 02/11/2018   LDLCALC 72 02/11/2018   TRIG 89 02/11/2018   CHOLHDL 3.1 07/19/2015  On Crestor and Zetia - last eye exam was in 2020: + DR; Had cataract sx B. Dr. Hassell Done [facility]). -+ Numbness and tingling mainly in L leg - affected  by stroke. Sees podiatry.  Hypothyroidism: -Diagnosed many years ago  She takes levothyroxine 50 mcg 6 out of 7 days and 25 mcg 1 out of 7 days: -In a.m. -Fasting -At least 30 minutes before breakfast -No calcium and iron -+ Multivitamin and AcipHex with lunch and AcipHex with dinner -+ Biotin in B complex  Latest TSH was normal: Lab Results  Component Value Date   TSH 0.52 10/08/2017   TSH 0.827 10/15/2016   TSH 0.906 01/22/2016   TSH 1.030 11/03/2015   TSH 1.207 02/21/2015   Thyroid nodules.    Pt denies: - feeling nodules in neck - hoarseness - dysphagia -  choking - SOB with lying down  She has chronic dysphagia with certain foods.  She had previous esophageal dilations.  Reviewed her imaging and biopsy tests: thyroid U/S (10/2013):  Right thyroid lobe: 3.7 x 1.0 x 1.8 cm   Left thyroid lobe: 3.5 x 1.5 x 1.6 cm   Isthmus: 0.5 cm   Focal nodules: There is a 0.3 mm calcified nodule in the right midzone. There is a 0.6 x 0.7 x 0.5 cm hypoechoic nodule in the  lateral aspect of the right midzone. There is a 0.7 x 0.6 x 0.5 cm hyperechoic nodule in the lateral aspect of the right lower pole.  There is a 2.2 x 1.1 x 1.7 cm complex solid nodule in the inferior aspect of the isthmus extending adjacent to the left lower pole.  There is a 1.4 x 2.1 x 1.4 cm complex solid nodule in the left lower pole.   Lymphadenopathy: None visualized.  Repeat U/S (01/2014): Stable appearance of the nodules  FNA (2014) x2: Benign  Repeat U/S (10/2015): stable appearance of the nodules  Repeat U/S (10/2016): stable appearance of the nodules  She had a L rib fracture in 12/2014.  Patient was admitted with AMS + sepsis on 10/15/2016.  She had aspiration pneumonia and acute respiratory failure  ROS: Constitutional: + weight gain (8 lbs)/no weight loss, no fatigue, no subjective hyperthermia, no subjective hypothermia Eyes: no blurry vision, no xerophthalmia ENT: no sore throat, + see HPI Cardiovascular: no CP/no SOB/no palpitations/no leg swelling Respiratory: no cough/no SOB/no wheezing Gastrointestinal: no N/no V/no D/no C/no acid reflux Musculoskeletal: no muscle aches/no joint aches Skin: no rashes, no hair loss Neurological: no tremors/no numbness/no tingling/no dizziness  I reviewed pt's medications, allergies, PMH, social hx, family hx, and changes were documented in the history of present illness. Otherwise, unchanged from my initial visit note.  Past Medical History:  Diagnosis Date  . Allergy   . Anemia   . Anxiety    takes Ativan daily   . Arthritis   . Assistance needed for mobility   . Bipolar disorder ([place])    takes Risperdal nightly  . Blood transfusion   . Cancer ([place])    In her gum  . Carpal tunnel syndrome of right wrist 05/23/2011  . Cervical disc disorder with radiculopathy of cervical region 10/31/2012  . Chronic back pain   . Chronic idiopathic constipation   . Chronic neck and back pain   . Colon polyps   . COPD (chronic obstructive pulmonary disease) with chronic bronchitis ([place]) 09/16/2013   Office Spirometry 10/30/2013-submaximal effort based on appearance of loop and curve. Numbers would fit with severe restriction but her physiologic capability may be better than this. FVC 0.91/44%, and 10.74/45%, FEV1/FVC 0.81, FEF 25-75% 1.43/69%    . Depression    takes Zoloft daily  .  Diabetes mellitus    Type II  . Diverticulosis    TCS 9/08 by Dr. Delfin Edis for diarrhea . Bx for micro scopic colitis negative.   . Fibromyalgia   . Frequent falls   . GERD (gastroesophageal reflux disease)    takes Aciphex daily  . Glaucoma    eye drops daily  . Gum symptoms    infection on antibiotic  . Hemiplegia affecting non-dominant side, post-stroke 08/02/2011  . Hyperlipidemia    takes Crestor daily  . Hypertension    takes Amlodipine,Metoprolol,and Clonidine daily  . Hypothyroidism    takes Synthroid daily  . IBS (irritable bowel syndrome)   . Insomnia    takes Trazodone nightly  . 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 Metabolic encephalopathy 3/97/6734  . Migraines    chronic headaches  . Mononeuritis lower limb   . Narcolepsy   . Osteoporosis   . Pancreatitis 2006   due to Depakote with normal EUS   . Schatzki's ring    non critical / EGD with ED 8/2011with RMR  . Seizures ([place])    takes Lamictal daily.Last seizure 3 yrs ago  . Sleep apnea    on CPAP  . Stroke [facility])    left sided weakness, speech changes  . Tubular adenoma of colon    Past Surgical History:  Procedure Laterality Date  . ABDOMINAL  HYSTERECTOMY  1978  . BACK SURGERY  July 2012  . BACTERIAL OVERGROWTH TEST N/A 05/05/2013   Procedure: BACTERIAL OVERGROWTH TEST;  Surgeon: Daneil Dolin, MD;  Location[facility];  Service: Endoscopy;  Laterality: N/A;  7:30  . BIOPSY THYROID  2009  . BRAIN SURGERY  11/2011   resection of meningioma  . BREAST REDUCTION SURGERY  1994  . CARDIAC CATHETERIZATION  05/10/2005   normal coronaries, normal LV systolic function and EF (Dr. Jackie Plum)  . CARPAL TUNNEL RELEASE Left 07/22/04   Dr. Aline Brochure  . CATARACT EXTRACTION Bilateral   . CHOLECYSTECTOMY  1984  . COLONOSCOPY N/A 09/25/2012   LPF:[id number] diverticulosis.  colonic polyp-removed : tubular adenoma  . CRANIOTOMY  11/23/2011   Procedure: CRANIOTOMY TUMOR EXCISION;  Surgeon: Hosie Spangle, MD;  Location: [place] NEURO ORS;  Service: Neurosurgery;  Laterality: N/A;  Craniotomy for tumor resection  . ESOPHAGOGASTRODUODENOSCOPY  12/29/2010   Rourk-Retained food in the esophagus and stomach, small hiatal hernia, status post Maloney dilation of the esophagus  . ESOPHAGOGASTRODUODENOSCOPY N/A 09/25/2012   BDZ:HGDJMEQA atonic baggy esophagus status post Maloney dilation 62 F. Hiatal hernia  . GIVENS CAPSULE STUDY N/A 01/15/2013   NORMAL.   . IR GENERIC HISTORICAL  03/17/2016   IR RADIOLOGIST EVAL & MGMT 03/17/2016 [facility]-INTERV RAD  . LESION REMOVAL N/A 05/31/2015   Procedure: REMOVAL RIGHT AND LEFT LESIONS OF MANDIBLE;  Surgeon: Diona Browner, DDS;  Location: [place];  Service: Oral Surgery;  Laterality: N/A;  . MALONEY DILATION  12/29/2010   RMR;  . NM MYOCAR PERF WALL MOTION  2006   "relavtiely normal" persantine, mild anterior thinning (breast attenuation artifact), no region of scar/ischemia  . OVARIAN CYST REMOVAL    . RECTOCELE REPAIR N/A 06/29/2015   Procedure: POSTERIOR REPAIR (RECTOCELE);  Surgeon: Jonnie Kind, MD;  Location: [facility];  Service: Gynecology;  Laterality: N/A;  . REDUCTION MAMMAPLASTY Bilateral   . SPINE SURGERY  09/29/2010    Dr. Rolena Infante  . surgical excision of 3 tumors from right thigh and right buttock  and left upper thigh  2010  .  TOOTH EXTRACTION Bilateral 12/14/2014   Procedure: REMOVAL OF BILATERAL MANDIBULAR EXOSTOSES;  Surgeon: Diona Browner, DDS;  Location: [place];  Service: Oral Surgery;  Laterality: Bilateral;  . TRANSTHORACIC ECHOCARDIOGRAM  2010   EF 60-65%, mild conc LVH, grade 1 diastolic dysfunction; mildly calcified MV annulus with mildly thickened leaflets, mildly calcified MR annulus   History   Social History  . Marital Status: Divorced    Spouse Name: N/A    Number of Children: 1  . Years of Education: 12   Occupational History  . disabled     Social History Main Topics  . Smoking status: Current Every Day Smoker -- 0.25 packs/day for 7 years    Types: Cigarettes  . Smokeless tobacco: Never Used     Comment: "started back but off now for 3 months" (08/18/13)  . Alcohol Use: No     Comment:    . Drug Use: No   Current Outpatient Medications on File Prior to Visit  Medication Sig Dispense Refill  . ACCU-CHEK FASTCLIX LANCETS MISC Use to check blood sugar 4 times a day 306 each 0  . albuterol (VENTOLIN HFA) 108 (90 Base) MCG/ACT inhaler Inhale 1-2 puffs every 6 hours as needed for wheezing, shortness of breath 3 Inhaler 3  . amLODipine (NORVASC) 10 MG tablet Take 1 tablet (10 mg total) by mouth daily. 90 tablet 3  . ascorbic acid (VITAMIN C) 500 MG tablet Take 500 mg by mouth daily.    [name] aspirin EC 81 MG tablet Take 81 mg by mouth daily.    . Blood Glucose Monitoring Suppl (ACCU-CHEK AVIVA) device Use as instructed 1 each 0  . cetirizine (ZYRTEC) 10 MG tablet Take 1 tablet (10 mg total) by mouth daily. 7 tablet 0  . Cholecalciferol (VITAMIN D3) 5000 units CAPS Take 1 capsule by mouth daily.    . clotrimazole-betamethasone (LOTRISONE) cream Apply sparingly twice daily to rash on face for 1 week, then only as needed 45 g 0  . diclofenac sodium (VOLTAREN) 1 % GEL Apply 2 g topically 4  (four) times daily. 1 Tube 3  . docusate sodium (COLACE) 100 MG capsule Take 100 mg by mouth 2 (two) times daily.    . DROPLET PEN NEEDLES 32G X 4 MM MISC USE  TO INJECT FOUR TIMES DAILY 400 each 0  . glucose blood (ACCU-CHEK AVIVA) test strip Use to check blood sugar 4 times a day 400 each 2  . hydroxychloroquine (PLAQUENIL) 200 MG tablet Take 200 mg by mouth daily.     . hydrOXYzine (ATARAX/VISTARIL) 10 MG tablet Take 1 tablet (10 mg total) by mouth 3 (three) times daily as needed. 30 tablet 0  . insulin aspart (NOVOLOG FLEXPEN) 100 UNIT/ML FlexPen INJECT 10 TO 14 UNITS INTO THE SKIN 3 (THREE) TIMES DAILY WITH MEALS. 45 mL 3  . Insulin Glargine (TOUJEO SOLOSTAR) 300 UNIT/ML SOPN Inject 20 Units into the skin at bedtime. 9 pen 3  . lamoTRIgine (LAMICTAL) 100 MG tablet Take 1 tablet (100 mg total) by mouth 2 (two) times daily. 180 tablet 2  . levothyroxine (SYNTHROID, LEVOTHROID) 50 MCG tablet TAKE 1 TABLET  DAILY  EXCEPT TAKE 1/2 TABLET  ON  SUNDAY 85 tablet 1  . LORazepam (ATIVAN) 0.5 MG tablet Take 1 tablet (0.5 mg total) by mouth 2 (two) times daily. 180 tablet 2  . losartan (COZAAR) 50 MG tablet TAKE 1 TABLET EVERY DAY 90 tablet 1  . metaxalone (SKELAXIN) 800 MG tablet metaxalone 800  mg tablet  take 1 tablet by mouth 3 times daily as needed.    . metoprolol tartrate (LOPRESSOR) 50 MG tablet TAKE 1 TABLET TWICE DAILY (NEED MD APPOINTMENT) 180 tablet 2  . montelukast (SINGULAIR) 10 MG tablet TAKE 1 TABLET EVERY DAY 90 tablet 1  . Multiple Vitamins-Minerals (HM MULTIVITAMIN ADULT GUMMY PO) Take 1 tablet by mouth daily.    . Nicotine (NICOTROL NS) 10 MG/ML SOLN Use 1 spray per nostril up to eight times per day as needed 10 mL 0  . nystatin (MYCOSTATIN/NYSTOP) powder APPLY TO AFFECTED AREA 4 TIMES DAILY. 30 g 2  . ondansetron (ZOFRAN) 4 MG tablet Take 1 tablet by mouth every 6 hours as needed for nausea. 50 tablet 1  . pregabalin (LYRICA) 75 MG capsule Take 1 capsule (75 mg total) by mouth daily.     . RABEprazole (ACIPHEX) 20 MG tablet Take 1 tablet (20 mg total) by mouth 2 (two) times daily. 180 tablet 3  . RESTASIS 0.05 % ophthalmic emulsion Place 1 drop into both eyes 2 (two) times daily.     . risperiDONE (RISPERDAL) 0.5 MG tablet Take 1 tablet (0.5 mg total) by mouth at bedtime. 90 tablet 2  . rosuvastatin (CRESTOR) 5 MG tablet TAKE 1 TABLET AT BEDTIME 90 tablet 1  . sertraline (ZOLOFT) 100 MG tablet Take 1 tablet (100 mg total) by mouth daily. 90 tablet 2  . Tiotropium Bromide-Olodaterol (STIOLTO RESPIMAT) 2.5-2.5 MCG/ACT AERS Inhale 2 puffs into the lungs daily. 12 g 3  . TOUJEO SOLOSTAR 300 UNIT/ML SOPN INJECT 20 UNITS INTO THE SKIN AT BEDTIME. 6 pen 2  . traZODone (DESYREL) 150 MG tablet Take 0.5 tablets (75 mg total) by mouth at bedtime. 135 tablet 2  . triamcinolone ointment (KENALOG) 0.1 % Apply 1 application topically at bedtime. Apply to elbows at bedtime.    [name] umeclidinium-vilanterol (ANORO ELLIPTA) 62.5-25 MCG/INH AEPB Inhale 1 puff into the lungs daily. 1 each 5  . UNABLE TO FIND Transport Chair x 1  Dx-bilateral leg weakness, frequent falls (Patient not taking: Reported on 07/09/2018) 1 each 0  . UNABLE TO FIND Rolling walker with seat x 1  DX. Bilateral lower extremity weakness, frequent falls, instability (Patient not taking: Reported on 07/09/2018) 1 each 0  . UNABLE TO FIND Lift chair x 1  DX M47.16, R29.6 (Patient not taking: Reported on 07/09/2018) 1 each 0   No current facility-administered medications on file prior to visit.       Allergies  Allergen Reactions  . Cephalexin Hives  . Iron Nausea And Vomiting  . Milk-Related Compounds Other (See Comments)    Doesn't agree with stomach.   . Penicillins Hives       . Phenazopyridine Hcl Hives         Family History  Problem Relation Age of Onset  . Heart attack Mother        HTN  . Pneumonia Father   . Kidney failure Father   . Diabetes Father   . Pancreatic cancer Sister   . Diabetes Brother   .  Hypertension Brother   . Diabetes Brother   . Cancer Sister        breast   . Hypertension Son   . Sleep apnea Son   . Cancer Sister        pancreatic  . Stroke Maternal Grandmother   . Heart attack Maternal Grandfather   . Alcohol abuse Maternal Uncle   . Colon cancer Neg  Hx   . Anesthesia problems Neg Hx   . Hypotension Neg Hx   . Malignant hyperthermia Neg Hx   . Pseudochol deficiency Neg Hx    PE: There were no vitals taken for this visit. There is no height or weight on file to calculate BMI.  Wt Readings from Last 3 Encounters:  07/03/18 173 lb 1.9 oz (78.5 kg)  05/03/18 168 lb (76.2 kg)  04/17/18 163 lb (73.9 kg)   Constitutional:  in NAD  The physical exam was not performed (telephone visit).  ASSESSMENT: 1. DM2, insulin-dependent, uncontrolled, without complications - gastroparesis - cerebrovasc. Ds -  H/o stroke - PN  She was interested in an insulin pump >> discussed about [employer] (given brochure).  2. Hypothyroidism  3. MNG - Thyroid U/S (10/11/2012):  Right thyroid lobe: 3.7 x 1.0 x 1.8 cm  Left thyroid lobe: 3.5 x 1.5 x 1.6 cm  Isthmus: 0.5 cm  Focal nodules: There is a 0.3 mm calcified nodule in the right midzone. There is a 0.6 x 0.7 x 0.5 cm hypoechoic nodule in the lateral aspect of the right midzone. There is a 0.7 x 0.6 x 0.5 cm hyperechoic nodule in the lateral aspect of the right lower pole. There is a 2.2 x 1.1 x 1.7 cm complex solid nodule in the inferior aspect of the isthmus extending adjacent to the left lower pole. There is a 1.4 x 2.1 x 1.4 cm complex solid nodule in the left lower pole.  Lymphadenopathy: None visualized.  IMPRESSION: Multi nodular goiter which has markedly progressed since the prior exam. The dominant nodule at the inferior aspect of the isthmus fits criteria for fine needle aspiration biopsy if not previously Assessed.  - FNA (11/05/2012) x2: benign  - Thyroid U/S (01/20/2014) - felt one nodule  enlarging >> new U/S: stable appearance of the nodules, except a small new nodule in isthmus: 1.2 cm   Right thyroid lobe Measurements: 4.4 cm x 1.2 cm x 1.7 cm. Multiple right-sided nodules identified. Each right-sided nodule demonstrates increased echogenicity, with the superior measuring 7 mm -8 mm, most inferior measuring 9 mm - 10 mm. A small, 3 mm focus of calcium with posterior shadowing is evident. There is also a mid nodule measuring 7 mm, which is echogenic.  Left thyroid lobe Measurements: 5.1 cm x 2.1 cm x 2.3 cm. Dominant lesion at the inferior pole of left thyroid is again evident, which has been previously biopsied (10/29/2012). This nodule measures 1.8 cm x 1.7cm x 2.5 cm. (Previous 1.4 cm x 2.1 cm x 1.4 cm)  Isthmus Thickness: 4 mm-5 mm. Isthmic nodule again noted, previously biopsied (10/29/2012). Currently this nodule measures 2.2 cm x 1.4 cm x 1.8 cm. (previous measurement 2.2 cm x 1.1 cm x 1.7 cm). There is a new nodule identified within the isthmus with heterogeneously hyperechoic characteristics. This nodule measures 12 mm x 5.4 mm x 7.2 mm.  Lymphadenopathy: None visualized.  - Thyroid U/S (11/09/2015): Details   Reading Physician Reading Date Result Priority  Sandi Mariscal, MD 11/09/2015   Narrative    COMPARISON: Thyroid ultrasound - 01/20/2014 ; 10/11/2012  FINDINGS: Parenchymal Echotexture: Moderately heterogenous Estimated total number of nodules > 1 cm: <5 Number of spongiform nodules > 2 cm not described below (TR1): 0 Number of mixed cystic nodules > 1.5 cm not described below ([place]): 0 _____________________________________________________  Isthmus: 0.7 cm  Nodule # 1: Prior biopsy: No Location: Isthmus; left Size: 1.5 x 1.3 x 1.5 cm, previously  2.2 x 1.4 x 1.8 cm Composition: solid/almost completely solid (2) Echogenicity: hyperechoic (1) ACR TI-RADS total points: 3. Change in features: Yes. Increased internal cystic degeneration and smaller in  size. Change in ACR TI-RADS risk category: No  *Given size (1.5 - 2.4 cm) and appearance, a follow-up ultrasound in 1 year is recommended based on TI-RADS criteria as clinically indicated.  _________________________________________________________  Right lobe: 4.6 x 1.1 x 1.9 cm, previously 4.4 x 1.2 x 1.7 cm Stable scattered subcentimeter echogenic nodules _________________________________________________________  Left lobe: 4.8 x 2.1 x 1.9 cm, previously 5.1 x 2.1 x 2.3 cm Nodule # 1: Prior biopsy: No Location: Left; Inferior Size: 2.4 x 1.7 x 1.9 cm, previously 2.5 x 1.7 x 1.7 Composition: solid/almost completely solid (2) Echogenicity: hyperechoic (1) ACR TI-RADS total points: 3. ACR TI-RADS risk category: TR3 (3 points). Change in features: No Change in ACR TI-RADS risk category: No *Given size (1.5 - 2.4 cm) and appearance, a follow-up ultrasound in 1 year is recommended based on TI-RADS criteria as clinically indicated.  IMPRESSION: TR 3 isthmic and left thyroid nodules unchanged. *Given size (1.5 - 2.4 cm) and appearance of both thyroid nodules, a follow-up ultrasound in 1 year is recommended based on TI-RADS criteria as clinically indicated.  The above is in keeping with the ACR TI-RADS recommendations Natasha Mead Coll Radiol 2017;14:[id number].        10/12/2016: Thyroid U/S: Parenchymal Echotexture: Moderately heterogenous Isthmus: 1.1 cm Right lobe: 3.8 x 1.0 x 1.7 cm Left lobe: 4.5 x 1.7 x 2.1 cm ______________________________________________________  Estimated total number of nodules >/= 1 cm: 3 _____________________________________________  Diffusely heterogeneous multinodular thyroid gland. As seen previously, there are multiple small echogenic nodules scattered throughout the right gland which do not meet criteria for biopsy or follow-up. Head and lobular pyramidal lobe extends superiorly from the thyroid isthmus. No interval change.  The  previously biopsied nodule in the inferior aspect of the isthmus measures slightly smaller today at 1.5 x 1.0 x 1.4 cm compared to 1.7 x 1.3 x 1.5 cm previously.  The previously biopsied nodule in the left inferior gland is essentially unchanged at 2.6 x 1.3 x 1.5 cm compared to 2.4 x 1.8 x 1.9 cm. No new nodule or abnormality identified.  IMPRESSION: 1. Stable multinodular goiter with a prominent lobular parental lobe extending superiorly from the thyroid isthmus. 2. The previously biopsied nodule in the inferior isthmus measures slightly smaller on today's examination. 3. The previously biopsied nodule in the left inferior gland is stable. 4. Additional scattered small and benign-appearing nodules do not meet criteria for biopsy or continued follow-up. The above is in keeping with the ACR TI-RADS recommendations - J Am Coll Radiol 2017;14:[id number].   PLAN:  1. Patient with longstanding, previously uncontrolled type 2 diabetes, on basal-bolus insulin regimen, with improved control in the last 2 years.  However, since last visit, sugars started to increase after she had a steroid taper a month ago.  They are still above target in the first half of the day, but they improved towards the end of the day.  I believe that the dose of NovoLog with her lunch and dinner is appropriate, but I advised her to increase the dose of NovoLog with breakfast to improve sugars before lunch.  Says sugars in the morning are also high, will increase Toujeo slightly. - I advised her to:  Patient Instructions  Please return in 3 months.  Please increase: - Toujeo to 25 units at bedtime -  Novolog: 10-14 units before a meal, but use 12 units before b'fast  Please stop B complex 1 week before next visit.  Please continue levothyroxine 50 mcg 6/7 days, 25 mcg 1/7 days.  Take the thyroid hormone every day, with water, at least 30 minutes before breakfast, separated by at least 4 hours from: - acid reflux  medications - calcium - iron - multivitamins  -We will check her HbA1c when she returns to the clinic - continue checking sugars at different times of the day - check 4x a day, rotating checks - advised for yearly eye exams >> she is UTD - Return to clinic in 3-4 mo with sugar log    2. Hypothyroidism - latest thyroid labs reviewed with pt >> normal  - she continues on LT4 50 mcg 6/7 days and 25 mcg 1/7 days - pt feels good on this dose. - we discussed about taking the thyroid hormone every day, with water, >30 minutes before breakfast, separated by >4 hours from acid reflux medications, calcium, iron, multivitamins. Pt. is taking it correctly. -recheck TFTs at next OV  3. MNG -She previously had 2 normal biopsies in 2014 -She has stable nodules per review of the ultrasound report from 2014 2018 At this point, I would not suggest to repeat the ultrasound unless she starts developing neck compression symptoms.  She has mild dysphagia but not externally esophageal compression for barium swallow study from 08/2014.  At that time she was found to have esophageal dysmotility and had multiple esophageal dilations. -We will continue to follow her goiter expectantly for now  - time spent with the patient: 20 min, of which >50% was spent in obtaining information about her symptoms, reviewing her previous labs, evaluations, and treatments, counseling her about her conditions (please see the discussed topics above), and developing a plan to further investigate and treat them; she had a number of questions which I addressed.  Philemon Kingdom, MD PhD [facility] Endocrinology

## 2018-07-09 NOTE — Telephone Encounter (Signed)
pls advise her to request from Ophthalmology, thanks

## 2018-07-09 NOTE — Patient Outreach (Signed)
[facility]) Care Management  07/09/2018   Linnae Rasool Minarik [birth year] [id number]  Subjective: Successful outreach to the patient for assessment.  HIPAA provided by the patient.  The patient states that she was still in the bed this morning .    She has not been sleeping well.  She states that her CPAP machine is not working correctly.  She had a telephone appointment with her pulmonologist on 4/24 and he is ordering her a new one.  She states that she will be receiving a new mask as well. The patient denies any falls but states that she is still having headaches and taking medication to help. She states that her blood pressure yesterday was 160/70.  She states that she is taking her medications as prescribed and monitoring the salt in her diet.  She states that she had an appointment with her PCP on 4/22 for a rash and was called in Lotrisone cream.  She states that it is helping.  The patient states that she rarely has to go out but if she does she is wearing a mask and gloves.    Current Medications:  Current Outpatient Medications  Medication Sig Dispense Refill  . ACCU-CHEK FASTCLIX LANCETS MISC Use to check blood sugar 4 times a day 306 each 0  . albuterol (VENTOLIN HFA) 108 (90 Base) MCG/ACT inhaler Inhale 1-2 puffs every 6 hours as needed for wheezing, shortness of breath 3 Inhaler 3  . ascorbic acid (VITAMIN C) 500 MG tablet Take 500 mg by mouth daily.    [name] aspirin EC 81 MG tablet Take 81 mg by mouth daily.    . Blood Glucose Monitoring Suppl (ACCU-CHEK AVIVA) device Use as instructed 1 each 0  . cetirizine (ZYRTEC) 10 MG tablet Take 1 tablet (10 mg total) by mouth daily. 7 tablet 0  . Cholecalciferol (VITAMIN D3) 5000 units CAPS Take 1 capsule by mouth daily.    . clotrimazole-betamethasone (LOTRISONE) cream Apply sparingly twice daily to rash on face for 1 week, then only as needed 45 g 0  . diclofenac sodium (VOLTAREN) 1 % GEL Apply 2 g topically 4 (four) times daily. 1  Tube 3  . docusate sodium (COLACE) 100 MG capsule Take 100 mg by mouth 2 (two) times daily.    . DROPLET PEN NEEDLES 32G X 4 MM MISC USE  TO INJECT FOUR TIMES DAILY 400 each 0  . glucose blood (ACCU-CHEK AVIVA) test strip Use to check blood sugar 4 times a day 400 each 2  . hydrOXYzine (ATARAX/VISTARIL) 10 MG tablet Take 1 tablet (10 mg total) by mouth 3 (three) times daily as needed. 30 tablet 0  . insulin aspart (NOVOLOG FLEXPEN) 100 UNIT/ML FlexPen INJECT 10 TO 14 UNITS INTO THE SKIN 3 (THREE) TIMES DAILY WITH MEALS. 45 mL 3  . Insulin Glargine (TOUJEO SOLOSTAR) 300 UNIT/ML SOPN Inject 20 Units into the skin at bedtime. 9 pen 3  . lamoTRIgine (LAMICTAL) 100 MG tablet Take 1 tablet (100 mg total) by mouth 2 (two) times daily. 180 tablet 2  . levothyroxine (SYNTHROID, LEVOTHROID) 50 MCG tablet TAKE 1 TABLET  DAILY  EXCEPT TAKE 1/2 TABLET  ON  SUNDAY 85 tablet 1  . LORazepam (ATIVAN) 0.5 MG tablet Take 1 tablet (0.5 mg total) by mouth 2 (two) times daily. 180 tablet 2  . losartan (COZAAR) 50 MG tablet TAKE 1 TABLET EVERY DAY 90 tablet 1  . metaxalone (SKELAXIN) 800 MG tablet metaxalone 800 mg tablet  take 1 tablet by mouth 3 times daily as needed.    . metoprolol tartrate (LOPRESSOR) 50 MG tablet TAKE 1 TABLET TWICE DAILY (NEED MD APPOINTMENT) 180 tablet 2  . montelukast (SINGULAIR) 10 MG tablet TAKE 1 TABLET EVERY DAY 90 tablet 1  . Multiple Vitamins-Minerals (HM MULTIVITAMIN ADULT GUMMY PO) Take 1 tablet by mouth daily.    . Nicotine (NICOTROL NS) 10 MG/ML SOLN Use 1 spray per nostril up to eight times per day as needed 10 mL 0  . nystatin (MYCOSTATIN/NYSTOP) powder APPLY TO AFFECTED AREA 4 TIMES DAILY. 30 g 2  . ondansetron (ZOFRAN) 4 MG tablet Take 1 tablet by mouth every 6 hours as needed for nausea. 50 tablet 1  . pregabalin (LYRICA) 75 MG capsule Take 1 capsule (75 mg total) by mouth daily.    . RABEprazole (ACIPHEX) 20 MG tablet Take 1 tablet (20 mg total) by mouth 2 (two) times daily.  180 tablet 3  . RESTASIS 0.05 % ophthalmic emulsion Place 1 drop into both eyes 2 (two) times daily.     . risperiDONE (RISPERDAL) 0.5 MG tablet Take 1 tablet (0.5 mg total) by mouth at bedtime. 90 tablet 2  . rosuvastatin (CRESTOR) 5 MG tablet TAKE 1 TABLET AT BEDTIME 90 tablet 1  . sertraline (ZOLOFT) 100 MG tablet Take 1 tablet (100 mg total) by mouth daily. 90 tablet 2  . Tiotropium Bromide-Olodaterol (STIOLTO RESPIMAT) 2.5-2.5 MCG/ACT AERS Inhale 2 puffs into the lungs daily. 12 g 3  . TOUJEO SOLOSTAR 300 UNIT/ML SOPN INJECT 20 UNITS INTO THE SKIN AT BEDTIME. 6 pen 2  . traZODone (DESYREL) 150 MG tablet Take 0.5 tablets (75 mg total) by mouth at bedtime. 135 tablet 2  . triamcinolone ointment (KENALOG) 0.1 % Apply 1 application topically at bedtime. Apply to elbows at bedtime.    [name] umeclidinium-vilanterol (ANORO ELLIPTA) 62.5-25 MCG/INH AEPB Inhale 1 puff into the lungs daily. 1 each 5  . amLODipine (NORVASC) 10 MG tablet Take 1 tablet (10 mg total) by mouth daily. 90 tablet 3  . hydroxychloroquine (PLAQUENIL) 200 MG tablet Take 200 mg by mouth daily.     [name] UNABLE TO FIND Transport Chair x 1  Dx-bilateral leg weakness, frequent falls (Patient not taking: Reported on 07/09/2018) 1 each 0  . UNABLE TO FIND Rolling walker with seat x 1  DX. Bilateral lower extremity weakness, frequent falls, instability (Patient not taking: Reported on 07/09/2018) 1 each 0  . UNABLE TO FIND Lift chair x 1  DX M47.16, R29.6 (Patient not taking: Reported on 07/09/2018) 1 each 0   No current facility-administered medications for this visit.     Functional Status:  In your present state of health, do you have any difficulty performing the following activities: 06/12/2018 01/31/2018  Hearing? N Y  Vision? N Y  Difficulty concentrating or making decisions? Tempie Donning  Walking or climbing stairs? Y Y  Dressing or bathing? N Y  Doing errands, shopping? Tempie Donning  Preparing Food and eating ? N Y  Using the Toilet? N N   Comment high toilet seat -  In the past six months, have you accidently leaked urine? Y Y  Comment had an accident yesterday morning/ wears pull ups: when out or at bedtime -  Do you have problems with loss of bowel control? N N  Managing your Medications? Y Y  Comment has an aide do this -  Managing your Finances? Y Y  Comment niece helps with her  bills  -  Housekeeping or managing your Housekeeping? Y Y  Comment aide helps with some of this -  Some recent data might be hidden    Fall/Depression Screening: Fall Risk  07/09/2018 07/03/2018 06/12/2018  Falls in the past year? 0 1 1  Number falls in past yr: - 1 0  Comment - - -  Injury with Fall? - 1 1  Comment - - -  Risk Factor Category  - - -  Risk for fall due to : - - History of fall(s)  Follow up - - Falls evaluation completed;Falls prevention discussed;Education provided   [facility] 2/9 Scores 06/12/2018 04/17/2018 01/31/2018 11/20/2017 10/08/2017 08/08/2017 06/29/2017  PHQ - 2 Score 2 0 1 3 3 2 1   PHQ- 9 Score 16 - - 6 16 5  -  Some encounter information is confidential and restricted. Go to Review Flowsheets activity to see all data.    Assessment: Patient will continue to benefit from health coach outreach for disease management and support. [facility] CM Care Plan Problem One     Most Recent Value  [facility] Long Term Goal   In 90 days the patient will verbalize maintaining her blood pressure and no hospital visits  [facility]N Long Term Goal Start Date  07/09/18  Interventions for Problem One Long Term Goal  Discussed decrease salt in the diet eating health foods, enouraged medicatio adherence. monitoring blood pressure, manage stress and smoking cessation.       Plan: RN Health Coach will contact patient in the month of July and patient agrees to next outreach.   [name] RN, BSN, [place] Direct Dial:  [phone]  Fax: [phone]

## 2018-07-09 NOTE — Patient Instructions (Signed)
Please return in 3 months.  Please increase: - Toujeo to 25 units at bedtime - Novolog: 10-14 units before a meal, but use 12 units before b'fast  Please stop B complex 1 week before next visit.  Please continue levothyroxine 50 mcg 6/7 days, 25 mcg 1/7 days.  Take the thyroid hormone every day, with water, at least 30 minutes before breakfast, separated by at least 4 hours from: - acid reflux medications - calcium - iron - multivitamins

## 2018-07-10 ENCOUNTER — Ambulatory Visit (INDEPENDENT_AMBULATORY_CARE_PROVIDER_SITE_OTHER): Payer: Medicare HMO | Admitting: Psychiatry

## 2018-07-10 ENCOUNTER — Telehealth: Payer: Self-pay | Admitting: Internal Medicine

## 2018-07-10 ENCOUNTER — Other Ambulatory Visit: Payer: Self-pay

## 2018-07-10 DIAGNOSIS — F17210 Nicotine dependence, cigarettes, uncomplicated: Secondary | ICD-10-CM | POA: Diagnosis not present

## 2018-07-10 DIAGNOSIS — I110 Hypertensive heart disease with heart failure: Secondary | ICD-10-CM | POA: Diagnosis not present

## 2018-07-10 DIAGNOSIS — I509 Heart failure, unspecified: Secondary | ICD-10-CM | POA: Diagnosis not present

## 2018-07-10 DIAGNOSIS — Z794 Long term (current) use of insulin: Secondary | ICD-10-CM | POA: Diagnosis not present

## 2018-07-10 DIAGNOSIS — Z79899 Other long term (current) drug therapy: Secondary | ICD-10-CM | POA: Diagnosis not present

## 2018-07-10 DIAGNOSIS — I69354 Hemiplegia and hemiparesis following cerebral infarction affecting left non-dominant side: Secondary | ICD-10-CM | POA: Diagnosis not present

## 2018-07-10 DIAGNOSIS — J449 Chronic obstructive pulmonary disease, unspecified: Secondary | ICD-10-CM | POA: Diagnosis not present

## 2018-07-10 DIAGNOSIS — F250 Schizoaffective disorder, bipolar type: Secondary | ICD-10-CM | POA: Diagnosis not present

## 2018-07-10 DIAGNOSIS — E1142 Type 2 diabetes mellitus with diabetic polyneuropathy: Secondary | ICD-10-CM | POA: Diagnosis not present

## 2018-07-10 DIAGNOSIS — F331 Major depressive disorder, recurrent, moderate: Secondary | ICD-10-CM | POA: Diagnosis not present

## 2018-07-10 NOTE — Telephone Encounter (Signed)
Called and spoke with [name] with [name] at phone [phone] regards clarity on cpap order from 06/20/18 Advised the order should be auto 5-15cm, mask of choice and supplies for cpap machine Kim verbalized and expressed understanding with this information Nothing further needed at this time.

## 2018-07-10 NOTE — Progress Notes (Signed)
Virtual Visit via Telephone Note  I connected with [name] on 07/10/18 at 11:15 AM by telephone and verified that I am speaking with the correct person using two identifiers.   I discussed the limitations, risks, security and privacy concerns of performing an evaluation and management service by telephone and the availability of in person appointments. I also discussed with the patient that there may be a patient responsible charge related to this service. The patient expressed understanding and agreed to proceed.   I provided 40 minutes of non-face-to-face time during this encounter.   [name], LCSW    THERAPIST PROGRESS NOTE  Session Time: Wednesday 07/10/2018 11:15 AM -  11:55 AM        Participation Level: Active  Behavioral Response: talkative, less depressed   Type of Therapy: Individual Therapy  Treatment Goals addressed: Learn and implement behavioral strategies to overcome depression.                                                       Verbalize an understanding and resolution of current interpersonal problems.   Interventions: CBT and Supportive  Summary: [name] is a 68 y.o. female who presents with  long standing history of recurrent periods  of depression beginning when she was 51 and her favorite uncle died. Patient reports multiple psychiatric hospitlaizations due to depression and suicidal ideations with the last one occuring in 1997. Patient has participated in outpatient psychotherapy and medication management intermittently since age 51. She currently is seeing psychiatrist Dr. Harrington Challenger . Prior to this, she was seen at [facility]. Patient also has had ECT at [facility]. Symptoms have worsened in recent months due to family stress and have  included depressed mood, anxiety, excessive worry, and tearfulness.   Patient's last contact was by virtual visit via telephone  about 2 weeks ago. She reports relief and much improved mood since last session. Per  her report, she used assertiveness skills by contacting owner of home health agency and requesting her aide not return to patient's home. She is pleased with aide's replacement as this aide had been assigned to patient part time and now is full time. Patient reports good relationship and rapport with aide. She also reports enjoying her son giving her a cookout at his home for her birthday earlier this month. She still expresses frustration with his fiancee but denies any inappropriate outbursts or negative interaction with her during the celebration. She expresses frustration with her niece who has power of attorney as she says niece often yells at her when they are discussing patient's financial issues.    Suicidal/Homicidal: Nowithout intent/plan  Therapist Response: praised and reinforced patient's use of assertiveness skills, discussed effects, discussed stressors, facilitated expression of thoughts and feelings, discussed ways patient can improve assertive communication through the use of "I messages with niece,  Plan: Return again in 2 weeks.  Diagnosis: Axis I: MDD, Recurrent, moderate    Axis II: Deferred    [name], LCSW 07/10/2018

## 2018-07-11 ENCOUNTER — Ambulatory Visit: Payer: Medicare HMO | Admitting: Podiatry

## 2018-07-11 IMAGING — CT CT RENAL STONE PROTOCOL
2 of 3 series · 16 of 46 positions shown, 18 images · non-contrast
Comparison: 10/10/2016

CLINICAL DATA: Sharp constant RIGHT flank pain radiating to RIGHT
groin, dysuria, frequency, suspected kidney stone, history of
pancreatitis, diverticulosis, type II diabetes mellitus, COPD, GERD,
irritable bowel syndrome, fibromyalgia

EXAM:
CT ABDOMEN AND PELVIS WITHOUT CONTRAST
TECHNIQUE: Multidetector CT imaging of the abdomen and pelvis was performed
following the standard protocol without IV contrast. Sagittal and
coronal MPR images reconstructed from axial data set. No oral
contrast administered.

[Series 2: axial st · axial · 0.82mm/px · z∈[-247,+113]mm · 13 of 84 slices shown, 15 images]
[im 6/84  soft-tissue]
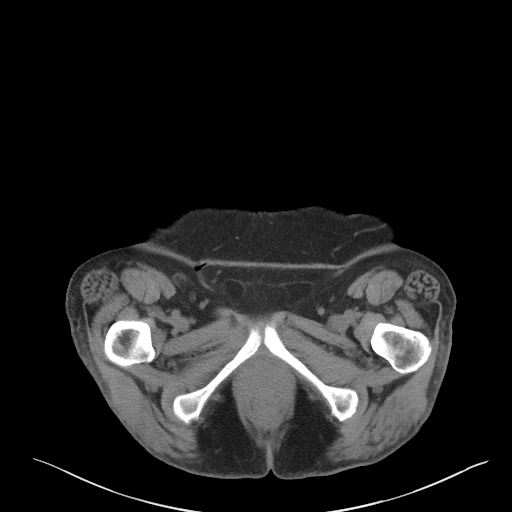
[im 6/84  bone]
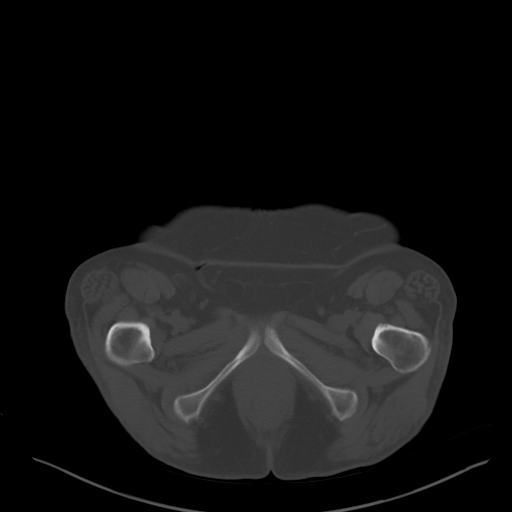
[im 11/84  soft-tissue]
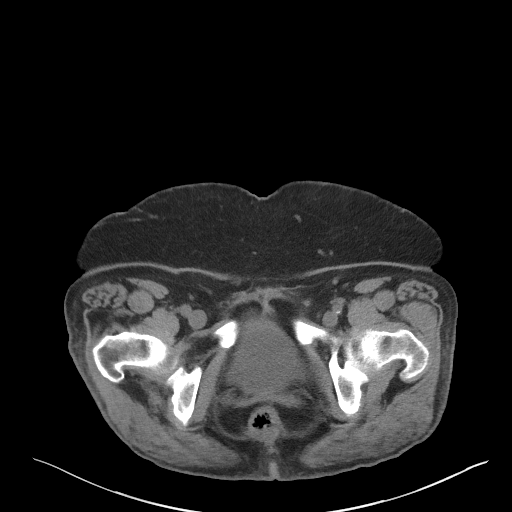
[im 17/84  soft-tissue]
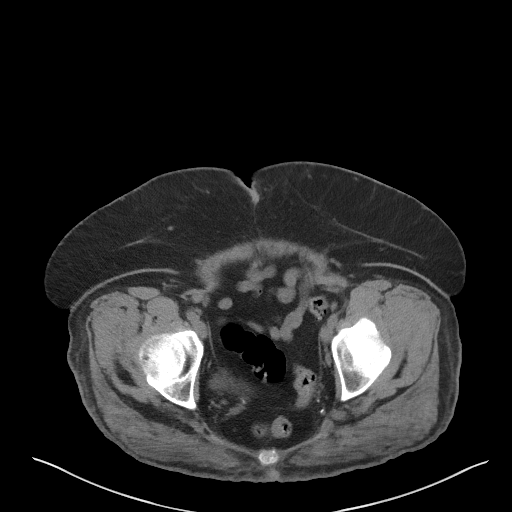
[im 25/84  soft-tissue]
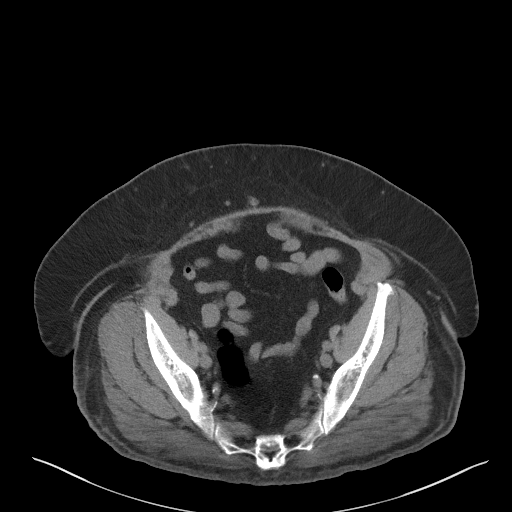
[im 30/84  soft-tissue]
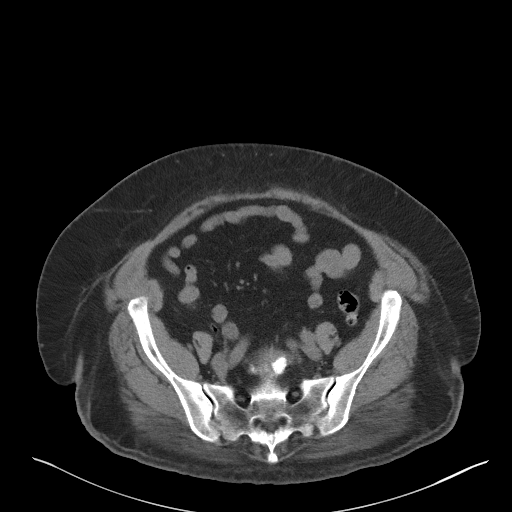
[im 35/84  soft-tissue]
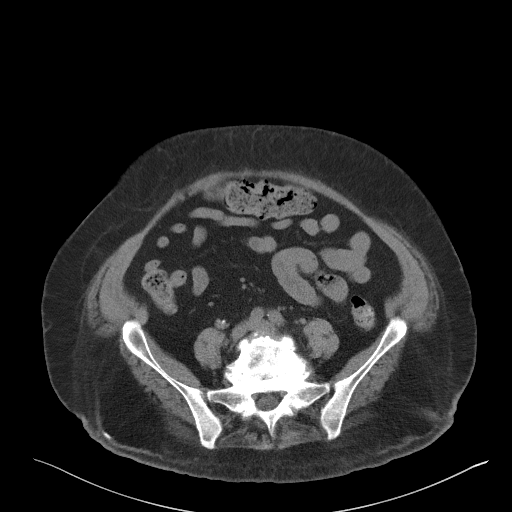
[im 43/84  soft-tissue]
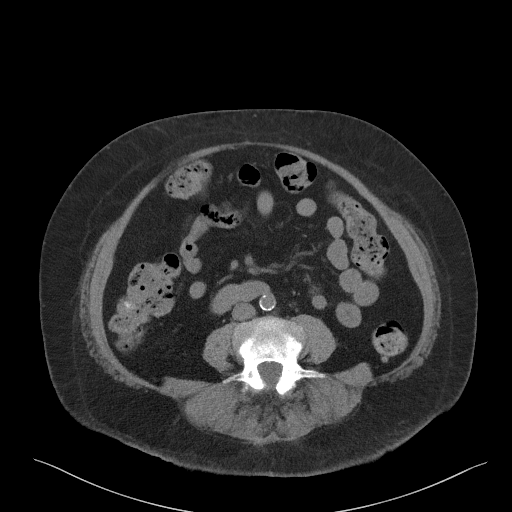
[im 49/84  soft-tissue]
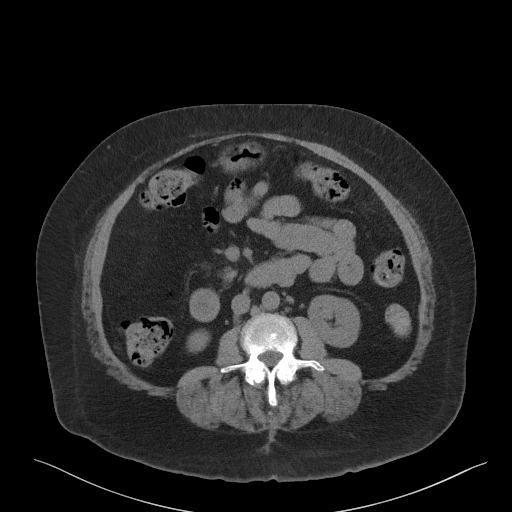
[im 54/84  soft-tissue]
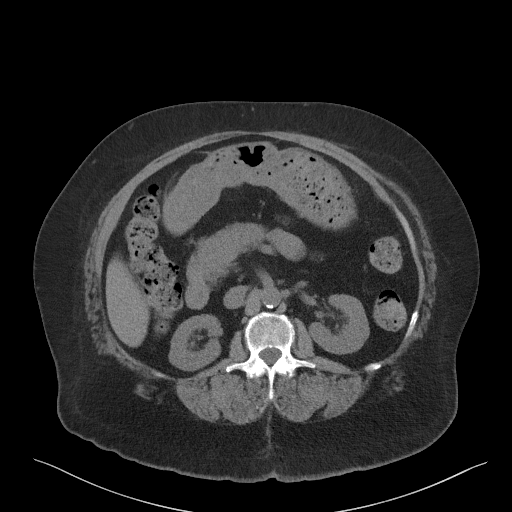
[im 54/84  bone]
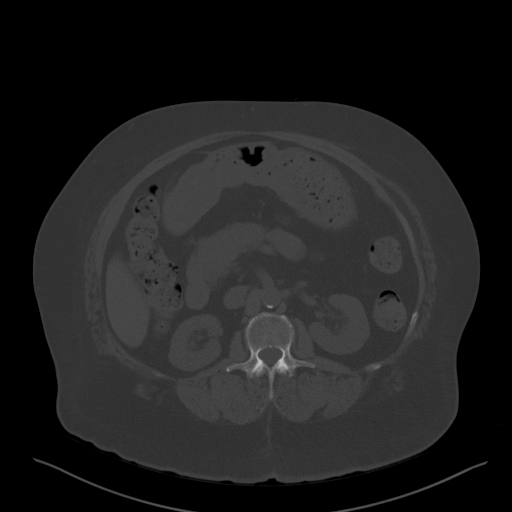
[im 59/84  soft-tissue]
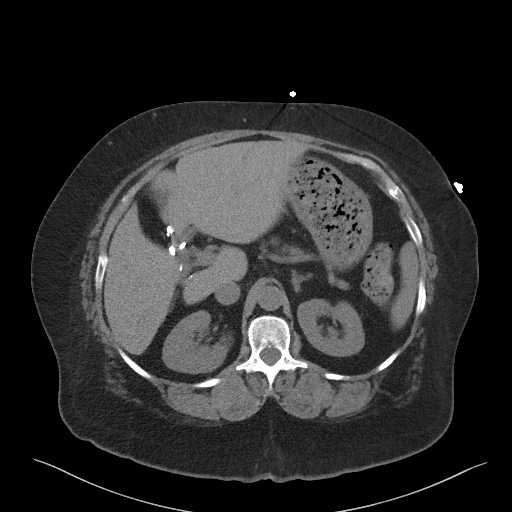
[im 67/84  soft-tissue]
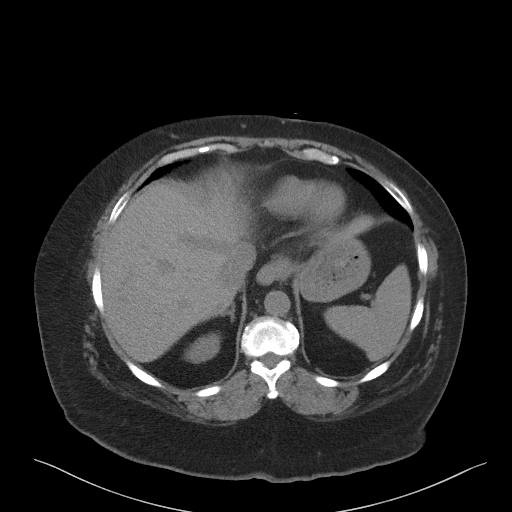
[im 73/84  soft-tissue]
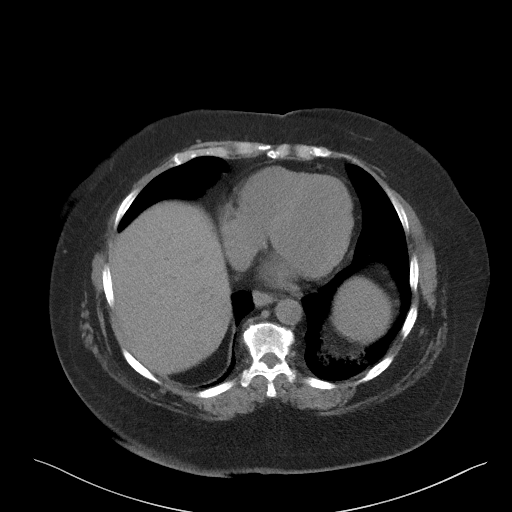
[im 78/84  soft-tissue]
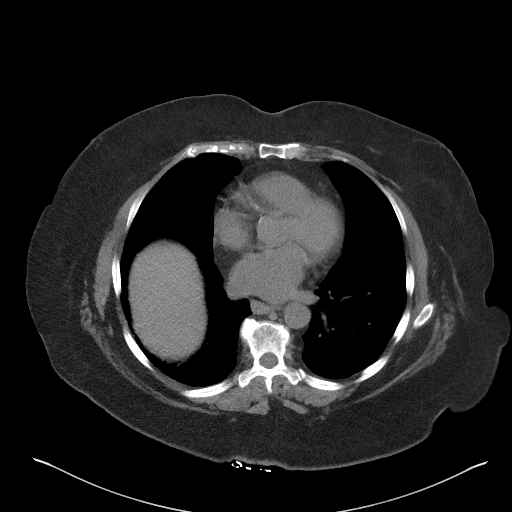

[Series 5: coronal st · coronal · 0.70mm/px · 3 of 115 slices shown]
[im 39/115  soft-tissue]
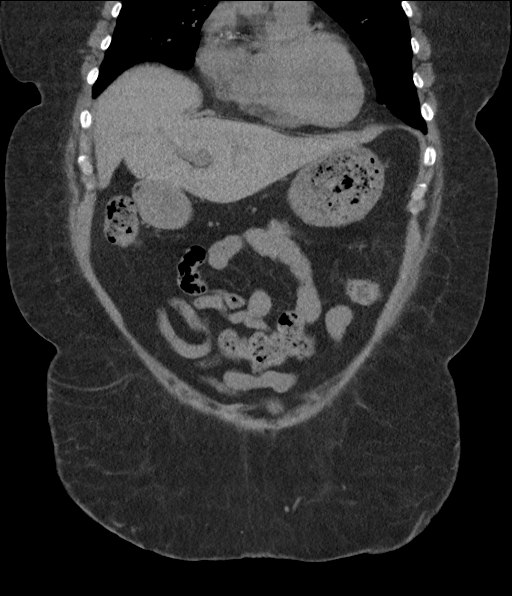
[im 51/115  soft-tissue]
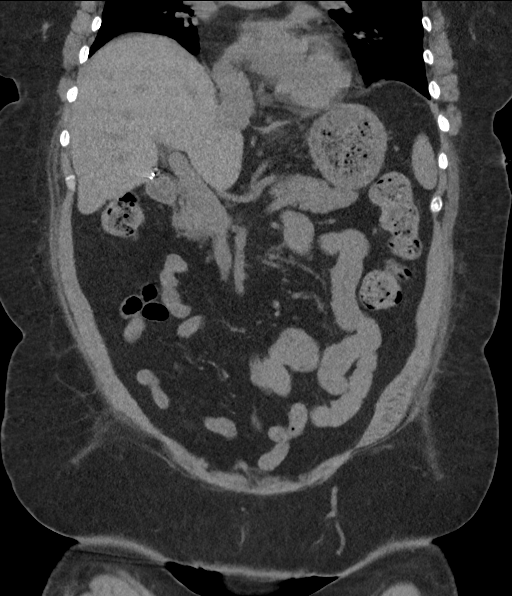
[im 64/115  soft-tissue]
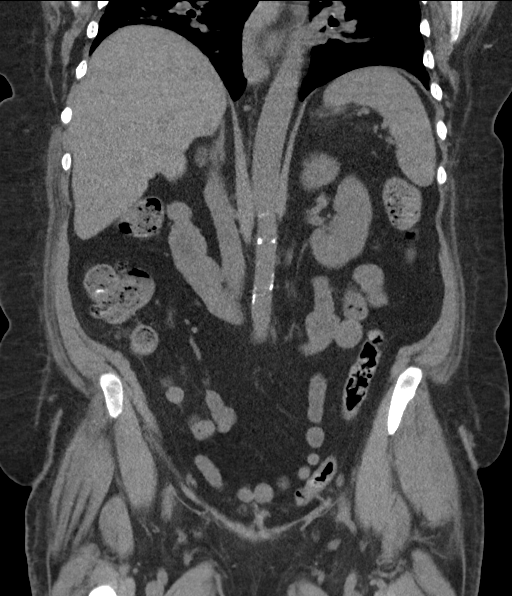

[16 of 46 positions shown; findings below may reference images not displayed]

FINDINGS: Lower chest: Peribronchial thickening and minimal atelectasis at
lung bases

Hepatobiliary: Gallbladder surgically absent.  Liver unremarkable.

Pancreas: Normal appearance

Spleen: Normal appearance

Adrenals/Urinary Tract: Adrenal glands normal appearance. Kidneys,
ureters, and bladder normal appearance. No urinary tract
calcification or dilatation.

Stomach/Bowel: Normal appendix. Scattered stool throughout colon.
Scattered colonic diverticula greatest at distal descending and
proximal sigmoid colon without evidence of diverticulitis. Stomach
and bowel loops otherwise normal appearance.

Vascular/Lymphatic: Atherosclerotic calcification aorta without
aneurysm. Few coronary arterial calcifications. No adenopathy.

Reproductive: Uterus surgically absent. Nonvisualization of ovaries.

Other: No free air free fluid.  No hernia.

Musculoskeletal: Degenerative disc disease changes at caudal aspect
of lumbar spine. No acute bone lesions.
IMPRESSION: Post cholecystectomy and hysterectomy with nonvisualization of
ovaries.

Mild colonic diverticulosis without evidence of diverticulitis.

No acute intra-abdominal or intrapelvic abnormalities.

Aortic Atherosclerosis ([id number]-KV2.2).

## 2018-07-12 DIAGNOSIS — I10 Essential (primary) hypertension: Secondary | ICD-10-CM | POA: Diagnosis not present

## 2018-07-12 DIAGNOSIS — J449 Chronic obstructive pulmonary disease, unspecified: Secondary | ICD-10-CM | POA: Diagnosis not present

## 2018-07-15 ENCOUNTER — Ambulatory Visit: Payer: Self-pay | Admitting: Family Medicine

## 2018-07-16 DIAGNOSIS — M25512 Pain in left shoulder: Secondary | ICD-10-CM | POA: Diagnosis not present

## 2018-07-16 DIAGNOSIS — M542 Cervicalgia: Secondary | ICD-10-CM | POA: Diagnosis not present

## 2018-07-16 DIAGNOSIS — M5030 Other cervical disc degeneration, unspecified cervical region: Secondary | ICD-10-CM | POA: Diagnosis not present

## 2018-07-18 NOTE — Telephone Encounter (Signed)
I let pt know she said that she does not have an eye dr she needs to get established with one

## 2018-07-22 ENCOUNTER — Other Ambulatory Visit: Payer: Self-pay | Admitting: Physician Assistant

## 2018-07-22 DIAGNOSIS — I509 Heart failure, unspecified: Secondary | ICD-10-CM | POA: Diagnosis not present

## 2018-07-22 DIAGNOSIS — J449 Chronic obstructive pulmonary disease, unspecified: Secondary | ICD-10-CM | POA: Diagnosis not present

## 2018-07-22 DIAGNOSIS — I110 Hypertensive heart disease with heart failure: Secondary | ICD-10-CM | POA: Diagnosis not present

## 2018-07-22 DIAGNOSIS — I69354 Hemiplegia and hemiparesis following cerebral infarction affecting left non-dominant side: Secondary | ICD-10-CM | POA: Diagnosis not present

## 2018-07-22 DIAGNOSIS — Z794 Long term (current) use of insulin: Secondary | ICD-10-CM | POA: Diagnosis not present

## 2018-07-22 DIAGNOSIS — F250 Schizoaffective disorder, bipolar type: Secondary | ICD-10-CM | POA: Diagnosis not present

## 2018-07-22 DIAGNOSIS — F17210 Nicotine dependence, cigarettes, uncomplicated: Secondary | ICD-10-CM | POA: Diagnosis not present

## 2018-07-22 DIAGNOSIS — Z79899 Other long term (current) drug therapy: Secondary | ICD-10-CM | POA: Diagnosis not present

## 2018-07-22 DIAGNOSIS — E1142 Type 2 diabetes mellitus with diabetic polyneuropathy: Secondary | ICD-10-CM | POA: Diagnosis not present

## 2018-07-23 ENCOUNTER — Telehealth: Payer: Self-pay | Admitting: Internal Medicine

## 2018-07-23 NOTE — Telephone Encounter (Signed)
Called and spoke with patient regarding concerns of her cpap machine Pt rec'd call from insurance company that they may not cover the cpap machine Pt advised that [facility] states that it is covered, and they can have someone go over how to use the machine Advised pt that [employer] would let her know if approved or not. She is calling [facility] today to schedule appt Pt verbalized and expressed understanding Nothing further needed at this time.

## 2018-07-24 DIAGNOSIS — M7542 Impingement syndrome of left shoulder: Secondary | ICD-10-CM | POA: Diagnosis not present

## 2018-07-25 ENCOUNTER — Ambulatory Visit (INDEPENDENT_AMBULATORY_CARE_PROVIDER_SITE_OTHER): Payer: Medicare HMO | Admitting: Psychiatry

## 2018-07-25 ENCOUNTER — Other Ambulatory Visit: Payer: Self-pay

## 2018-07-25 DIAGNOSIS — F331 Major depressive disorder, recurrent, moderate: Secondary | ICD-10-CM

## 2018-07-25 NOTE — Progress Notes (Signed)
Virtual Visit via Telephone Note  I connected with Buna Cuppett Gienger on 07/25/18 at 10:20 AM EDT by telephone and verified that I am speaking with the correct person using two identifiers.   I discussed the limitations, risks, security and privacy concerns of performing an evaluation and management service by telephone and the availability of in person appointments. I also discussed with the patient that there may be a patient responsible charge related to this service. The patient expressed understanding and agreed to proceed.  I provided 40 minutes of non-face-to-face time during this encounter.   [name], LCSW    THERAPIST PROGRESS NOTE  Session Time: Thursday 07/25/2018 10:20 AM -  11:00 AM     Participation Level: Active  Behavioral Response: talkative,  depressed   Type of Therapy: Individual Therapy  Treatment Goals addressed: Learn and implement behavioral strategies to overcome depression.                                                       Verbalize an understanding and resolution of current interpersonal problems.   Interventions: CBT and Supportive  Summary: Alexsis Kathman Kasper is a 68 y.o. female who presents with  long standing history of recurrent periods  of depression beginning when she was 56 and her favorite uncle died. Patient reports multiple psychiatric hospitlaizations due to depression and suicidal ideations with the last one occuring in 1997. Patient has participated in outpatient psychotherapy and medication management intermittently since age 30. She currently is seeing psychiatrist Dr. Harrington Challenger . Prior to this, she was seen at [facility] Patient also has had ECT at [facility]. Symptoms have worsened in recent months due to family stress and have  included depressed mood, anxiety, excessive worry, and tearfulness.   Patient's last contact was by virtual visit via telephone  about 2 weeks ago. She states being more down since last session as she has  increasing concerns about her health.  She reports results from a recent MRI indicate she has a bulging disc in her neck that could rupture.  She reports doctors have told her that a fall can make this much worse.  She also expresses frustration regarding pain and other health issues and fears she may have no other choice but to go to a nursing home if her health and skills do not improve.  She expresses frustration she sometimes is having difficulty understanding and getting help from some of her medical providers including issues related to use of her CPAP machine. She also expresses frustration she does not see her grandchildren as much as she would like but is pleased that her granddaughter spent the night with her earlier this week.        Suicidal/Homicidal: Nowithout intent/plan  Therapist Response: Reviewed symptoms, discussed stressors, facilitated expression of thoughts and feelings, validated feelings, discussed ways to use her support system, discussed her options of contacting her case manager regarding assistance in coordinating care and services at home, discussed patient's strengths she has used in previous adversities, assisted patient identifthow she can use those strengths include her spirituality in  coping with current issues   Plan: Return again in 2 weeks.  Diagnosis: Axis I: MDD, Recurrent, moderate    Axis II: Deferred    [name], LCSW 07/25/2018

## 2018-07-28 DIAGNOSIS — G4733 Obstructive sleep apnea (adult) (pediatric): Secondary | ICD-10-CM | POA: Diagnosis not present

## 2018-07-28 DIAGNOSIS — E785 Hyperlipidemia, unspecified: Secondary | ICD-10-CM | POA: Diagnosis not present

## 2018-07-31 DIAGNOSIS — M6281 Muscle weakness (generalized): Secondary | ICD-10-CM | POA: Diagnosis not present

## 2018-07-31 DIAGNOSIS — E119 Type 2 diabetes mellitus without complications: Secondary | ICD-10-CM | POA: Diagnosis not present

## 2018-07-31 DIAGNOSIS — Z9181 History of falling: Secondary | ICD-10-CM | POA: Diagnosis not present

## 2018-07-31 DIAGNOSIS — I69359 Hemiplegia and hemiparesis following cerebral infarction affecting unspecified side: Secondary | ICD-10-CM | POA: Diagnosis not present

## 2018-08-01 DIAGNOSIS — M5030 Other cervical disc degeneration, unspecified cervical region: Secondary | ICD-10-CM | POA: Diagnosis not present

## 2018-08-01 DIAGNOSIS — M5033 Other cervical disc degeneration, cervicothoracic region: Secondary | ICD-10-CM | POA: Diagnosis not present

## 2018-08-05 DIAGNOSIS — F17210 Nicotine dependence, cigarettes, uncomplicated: Secondary | ICD-10-CM | POA: Diagnosis not present

## 2018-08-05 DIAGNOSIS — I110 Hypertensive heart disease with heart failure: Secondary | ICD-10-CM | POA: Diagnosis not present

## 2018-08-05 DIAGNOSIS — J449 Chronic obstructive pulmonary disease, unspecified: Secondary | ICD-10-CM | POA: Diagnosis not present

## 2018-08-05 DIAGNOSIS — Z79899 Other long term (current) drug therapy: Secondary | ICD-10-CM | POA: Diagnosis not present

## 2018-08-05 DIAGNOSIS — E1142 Type 2 diabetes mellitus with diabetic polyneuropathy: Secondary | ICD-10-CM | POA: Diagnosis not present

## 2018-08-05 DIAGNOSIS — F250 Schizoaffective disorder, bipolar type: Secondary | ICD-10-CM | POA: Diagnosis not present

## 2018-08-05 DIAGNOSIS — Z794 Long term (current) use of insulin: Secondary | ICD-10-CM | POA: Diagnosis not present

## 2018-08-05 DIAGNOSIS — I69354 Hemiplegia and hemiparesis following cerebral infarction affecting left non-dominant side: Secondary | ICD-10-CM | POA: Diagnosis not present

## 2018-08-05 DIAGNOSIS — I509 Heart failure, unspecified: Secondary | ICD-10-CM | POA: Diagnosis not present

## 2018-08-06 DIAGNOSIS — M25512 Pain in left shoulder: Secondary | ICD-10-CM | POA: Diagnosis not present

## 2018-08-06 DIAGNOSIS — M7542 Impingement syndrome of left shoulder: Secondary | ICD-10-CM | POA: Diagnosis not present

## 2018-08-07 ENCOUNTER — Ambulatory Visit (INDEPENDENT_AMBULATORY_CARE_PROVIDER_SITE_OTHER): Payer: Medicare HMO | Admitting: Psychiatry

## 2018-08-07 ENCOUNTER — Other Ambulatory Visit: Payer: Self-pay

## 2018-08-07 DIAGNOSIS — Z794 Long term (current) use of insulin: Secondary | ICD-10-CM | POA: Diagnosis not present

## 2018-08-07 DIAGNOSIS — E1142 Type 2 diabetes mellitus with diabetic polyneuropathy: Secondary | ICD-10-CM | POA: Diagnosis not present

## 2018-08-07 DIAGNOSIS — G4733 Obstructive sleep apnea (adult) (pediatric): Secondary | ICD-10-CM | POA: Diagnosis not present

## 2018-08-07 DIAGNOSIS — F250 Schizoaffective disorder, bipolar type: Secondary | ICD-10-CM | POA: Diagnosis not present

## 2018-08-07 DIAGNOSIS — I509 Heart failure, unspecified: Secondary | ICD-10-CM

## 2018-08-07 DIAGNOSIS — Z79899 Other long term (current) drug therapy: Secondary | ICD-10-CM

## 2018-08-07 DIAGNOSIS — J449 Chronic obstructive pulmonary disease, unspecified: Secondary | ICD-10-CM | POA: Diagnosis not present

## 2018-08-07 DIAGNOSIS — F331 Major depressive disorder, recurrent, moderate: Secondary | ICD-10-CM

## 2018-08-07 DIAGNOSIS — I69354 Hemiplegia and hemiparesis following cerebral infarction affecting left non-dominant side: Secondary | ICD-10-CM | POA: Diagnosis not present

## 2018-08-07 DIAGNOSIS — F17210 Nicotine dependence, cigarettes, uncomplicated: Secondary | ICD-10-CM | POA: Diagnosis not present

## 2018-08-07 DIAGNOSIS — I110 Hypertensive heart disease with heart failure: Secondary | ICD-10-CM | POA: Diagnosis not present

## 2018-08-07 NOTE — Progress Notes (Signed)
Virtual Visit via Telephone Note  I connected with Romaine Maciolek Lindahl on 08/07/18 at 11:00 AM EDT by telephone and verified that I am speaking with the correct person using two identifiers.   I discussed the limitations, risks, security and privacy concerns of performing an evaluation and management service by telephone and the availability of in person appointments. I also discussed with the patient that there may be a patient responsible charge related to this service. The patient expressed understanding and agreed to proceed.   I provided 45 minutes of non-face-to-face time during this encounter.   [name], LCSW     THERAPIST PROGRESS NOTE  Session Time: Wednesday 08/07/2018 11:00 AM -  11:45 AM        Participation Level: Active  Behavioral Response: talkative,  depressed   Type of Therapy: Individual Therapy  Treatment Goals addressed: Learn and implement behavioral strategies to overcome depression.                                                       Verbalize an understanding and resolution of current interpersonal problems.   Interventions: CBT and Supportive  Summary: Lockie Bothun Bledsoe is a 68 y.o. female who presents with  long standing history of recurrent periods  of depression beginning when she was 23 and her favorite uncle died. Patient reports multiple psychiatric hospitlaizations due to depression and suicidal ideations with the last one occuring in 1997. Patient has participated in outpatient psychotherapy and medication management intermittently since age 27. She currently is seeing psychiatrist Dr. Harrington Challenger . Prior to this, she was seen at [facility]. Patient also has had ECT at [facility]. Symptoms have worsened in recent months due to family stress and have  included depressed mood, anxiety, excessive worry, and tearfulness.   Patient's last contact was by virtual visit via telephone  about 2 weeks ago. She reports continued stress related to medical issues. She  reports doctors have recommended 3 separate procedures for her neck, shoulder, and brain. She expresses frustration regarding niece telling her not to allow any more surgeries. She also expresses frustration she does not have more support in addressing these issues as well as making decisions. She is pleased she contacted insurance company and medical supply company. She reports she resolved issue regarding CPAP.      Suicidal/Homicidal: Nowithout intent/plan  Therapist Response: Reviewed symptoms, discussed stressors, facilitated expression of thoughts and feelings, validated feelings, assisted patient identify who she could talk to in support system regarding medical issues, discussed researching medical issues and using  pros/cons list, praised patient's successful efforts in resolving issues related CPAP, reviewed relaxation techniques,assertiveness techniques,  and pacing to manage stressful interactions with medical providers  Plan: Return again in 2 weeks.  Diagnosis: Axis I: MDD, Recurrent, moderate    Axis II: Deferred    [name], LCSW 08/07/2018

## 2018-08-08 ENCOUNTER — Other Ambulatory Visit (HOSPITAL_COMMUNITY): Payer: Self-pay | Admitting: *Deleted

## 2018-08-08 DIAGNOSIS — D500 Iron deficiency anemia secondary to blood loss (chronic): Secondary | ICD-10-CM

## 2018-08-08 DIAGNOSIS — D509 Iron deficiency anemia, unspecified: Secondary | ICD-10-CM

## 2018-08-09 ENCOUNTER — Other Ambulatory Visit: Payer: Self-pay

## 2018-08-09 ENCOUNTER — Inpatient Hospital Stay (HOSPITAL_COMMUNITY): Payer: Medicare HMO | Attending: Hematology

## 2018-08-09 DIAGNOSIS — D509 Iron deficiency anemia, unspecified: Secondary | ICD-10-CM | POA: Insufficient documentation

## 2018-08-09 DIAGNOSIS — D500 Iron deficiency anemia secondary to blood loss (chronic): Secondary | ICD-10-CM

## 2018-08-09 LAB — CBC WITH DIFFERENTIAL/PLATELET
Abs Immature Granulocytes: 0.07 10*3/uL (ref 0.00–0.07)
Basophils Absolute: 0 10*3/uL (ref 0.0–0.1)
Basophils Relative: 1 %
Eosinophils Absolute: 0.2 10*3/uL (ref 0.0–0.5)
Eosinophils Relative: 2 %
HCT: 36.1 % (ref 36.0–46.0)
Hemoglobin: 10.4 g/dL — ABNORMAL LOW (ref 12.0–15.0)
Immature Granulocytes: 1 %
Lymphocytes Relative: 26 %
Lymphs Abs: 2 10*3/uL (ref 0.7–4.0)
MCH: 23 pg — ABNORMAL LOW (ref 26.0–34.0)
MCHC: 28.8 g/dL — ABNORMAL LOW (ref 30.0–36.0)
MCV: 79.9 fL — ABNORMAL LOW (ref 80.0–100.0)
Monocytes Absolute: 0.7 10*3/uL (ref 0.1–1.0)
Monocytes Relative: 9 %
Neutro Abs: 4.9 10*3/uL (ref 1.7–7.7)
Neutrophils Relative %: 61 %
Platelets: 230 10*3/uL (ref 150–400)
RBC: 4.52 MIL/uL (ref 3.87–5.11)
RDW: 14.3 % (ref 11.5–15.5)
WBC: 7.9 10*3/uL (ref 4.0–10.5)
nRBC: 0 % (ref 0.0–0.2)

## 2018-08-09 LAB — COMPREHENSIVE METABOLIC PANEL
ALT: 25 U/L (ref 0–44)
AST: 24 U/L (ref 15–41)
Albumin: 3.4 g/dL — ABNORMAL LOW (ref 3.5–5.0)
Alkaline Phosphatase: 99 U/L (ref 38–126)
Anion gap: 10 (ref 5–15)
BUN: 23 mg/dL (ref 8–23)
CO2: 22 mmol/L (ref 22–32)
Calcium: 9.1 mg/dL (ref 8.9–10.3)
Chloride: 111 mmol/L (ref 98–111)
Creatinine, Ser: 1.03 mg/dL — ABNORMAL HIGH (ref 0.44–1.00)
GFR calc Af Amer: >60 mL/min (ref >60)
GFR calc non Af Amer: 56 mL/min — ABNORMAL LOW (ref >60)
Glucose, Bld: 142 mg/dL — ABNORMAL HIGH (ref 70–99)
Potassium: 4 mmol/L (ref 3.5–5.1)
Sodium: 143 mmol/L (ref 135–145)
Total Bilirubin: 0.6 mg/dL (ref 0.3–1.2)
Total Protein: 7 g/dL (ref 6.5–8.1)

## 2018-08-09 LAB — LACTATE DEHYDROGENASE: LDH: 208 U/L — ABNORMAL HIGH (ref 98–192)

## 2018-08-09 LAB — FERRITIN: Ferritin: 182 ng/mL (ref 11–307)

## 2018-08-10 DIAGNOSIS — M150 Primary generalized (osteo)arthritis: Secondary | ICD-10-CM | POA: Diagnosis not present

## 2018-08-10 DIAGNOSIS — I639 Cerebral infarction, unspecified: Secondary | ICD-10-CM | POA: Diagnosis not present

## 2018-08-10 DIAGNOSIS — R32 Unspecified urinary incontinence: Secondary | ICD-10-CM | POA: Diagnosis not present

## 2018-08-12 ENCOUNTER — Ambulatory Visit: Payer: Self-pay

## 2018-08-12 DIAGNOSIS — M159 Polyosteoarthritis, unspecified: Secondary | ICD-10-CM | POA: Diagnosis not present

## 2018-08-12 DIAGNOSIS — J449 Chronic obstructive pulmonary disease, unspecified: Secondary | ICD-10-CM | POA: Diagnosis not present

## 2018-08-12 DIAGNOSIS — M6281 Muscle weakness (generalized): Secondary | ICD-10-CM | POA: Diagnosis not present

## 2018-08-12 DIAGNOSIS — I69354 Hemiplegia and hemiparesis following cerebral infarction affecting left non-dominant side: Secondary | ICD-10-CM | POA: Diagnosis not present

## 2018-08-12 DIAGNOSIS — M5030 Other cervical disc degeneration, unspecified cervical region: Secondary | ICD-10-CM | POA: Diagnosis not present

## 2018-08-12 DIAGNOSIS — I110 Hypertensive heart disease with heart failure: Secondary | ICD-10-CM | POA: Diagnosis not present

## 2018-08-12 DIAGNOSIS — I10 Essential (primary) hypertension: Secondary | ICD-10-CM | POA: Diagnosis not present

## 2018-08-12 DIAGNOSIS — Z794 Long term (current) use of insulin: Secondary | ICD-10-CM | POA: Diagnosis not present

## 2018-08-12 DIAGNOSIS — E1142 Type 2 diabetes mellitus with diabetic polyneuropathy: Secondary | ICD-10-CM | POA: Diagnosis not present

## 2018-08-12 DIAGNOSIS — M7542 Impingement syndrome of left shoulder: Secondary | ICD-10-CM | POA: Diagnosis not present

## 2018-08-14 DIAGNOSIS — M7542 Impingement syndrome of left shoulder: Secondary | ICD-10-CM | POA: Diagnosis not present

## 2018-08-14 DIAGNOSIS — Z794 Long term (current) use of insulin: Secondary | ICD-10-CM | POA: Diagnosis not present

## 2018-08-14 DIAGNOSIS — I110 Hypertensive heart disease with heart failure: Secondary | ICD-10-CM | POA: Diagnosis not present

## 2018-08-14 DIAGNOSIS — E1142 Type 2 diabetes mellitus with diabetic polyneuropathy: Secondary | ICD-10-CM | POA: Diagnosis not present

## 2018-08-14 DIAGNOSIS — M159 Polyosteoarthritis, unspecified: Secondary | ICD-10-CM | POA: Diagnosis not present

## 2018-08-14 DIAGNOSIS — I69354 Hemiplegia and hemiparesis following cerebral infarction affecting left non-dominant side: Secondary | ICD-10-CM | POA: Diagnosis not present

## 2018-08-14 DIAGNOSIS — M5030 Other cervical disc degeneration, unspecified cervical region: Secondary | ICD-10-CM | POA: Diagnosis not present

## 2018-08-14 DIAGNOSIS — J449 Chronic obstructive pulmonary disease, unspecified: Secondary | ICD-10-CM | POA: Diagnosis not present

## 2018-08-14 DIAGNOSIS — M6281 Muscle weakness (generalized): Secondary | ICD-10-CM | POA: Diagnosis not present

## 2018-08-15 DIAGNOSIS — M159 Polyosteoarthritis, unspecified: Secondary | ICD-10-CM | POA: Diagnosis not present

## 2018-08-15 DIAGNOSIS — I110 Hypertensive heart disease with heart failure: Secondary | ICD-10-CM | POA: Diagnosis not present

## 2018-08-15 DIAGNOSIS — M5030 Other cervical disc degeneration, unspecified cervical region: Secondary | ICD-10-CM | POA: Diagnosis not present

## 2018-08-15 DIAGNOSIS — I69354 Hemiplegia and hemiparesis following cerebral infarction affecting left non-dominant side: Secondary | ICD-10-CM | POA: Diagnosis not present

## 2018-08-15 DIAGNOSIS — M6281 Muscle weakness (generalized): Secondary | ICD-10-CM | POA: Diagnosis not present

## 2018-08-15 DIAGNOSIS — M7542 Impingement syndrome of left shoulder: Secondary | ICD-10-CM | POA: Diagnosis not present

## 2018-08-15 DIAGNOSIS — E1142 Type 2 diabetes mellitus with diabetic polyneuropathy: Secondary | ICD-10-CM | POA: Diagnosis not present

## 2018-08-15 DIAGNOSIS — J449 Chronic obstructive pulmonary disease, unspecified: Secondary | ICD-10-CM | POA: Diagnosis not present

## 2018-08-15 DIAGNOSIS — Z794 Long term (current) use of insulin: Secondary | ICD-10-CM | POA: Diagnosis not present

## 2018-08-16 ENCOUNTER — Other Ambulatory Visit: Payer: Self-pay

## 2018-08-16 ENCOUNTER — Inpatient Hospital Stay (HOSPITAL_COMMUNITY): Payer: Medicare HMO | Attending: Hematology | Admitting: Hematology

## 2018-08-16 DIAGNOSIS — E039 Hypothyroidism, unspecified: Secondary | ICD-10-CM | POA: Diagnosis not present

## 2018-08-16 DIAGNOSIS — K589 Irritable bowel syndrome without diarrhea: Secondary | ICD-10-CM | POA: Insufficient documentation

## 2018-08-16 DIAGNOSIS — E785 Hyperlipidemia, unspecified: Secondary | ICD-10-CM | POA: Diagnosis not present

## 2018-08-16 DIAGNOSIS — E119 Type 2 diabetes mellitus without complications: Secondary | ICD-10-CM

## 2018-08-16 DIAGNOSIS — M129 Arthropathy, unspecified: Secondary | ICD-10-CM | POA: Insufficient documentation

## 2018-08-16 DIAGNOSIS — F17210 Nicotine dependence, cigarettes, uncomplicated: Secondary | ICD-10-CM | POA: Diagnosis not present

## 2018-08-16 DIAGNOSIS — Z79899 Other long term (current) drug therapy: Secondary | ICD-10-CM | POA: Insufficient documentation

## 2018-08-16 DIAGNOSIS — Z86010 Personal history of colonic polyps: Secondary | ICD-10-CM | POA: Diagnosis not present

## 2018-08-16 DIAGNOSIS — D509 Iron deficiency anemia, unspecified: Secondary | ICD-10-CM

## 2018-08-16 DIAGNOSIS — J449 Chronic obstructive pulmonary disease, unspecified: Secondary | ICD-10-CM

## 2018-08-16 DIAGNOSIS — M810 Age-related osteoporosis without current pathological fracture: Secondary | ICD-10-CM | POA: Insufficient documentation

## 2018-08-16 DIAGNOSIS — G8929 Other chronic pain: Secondary | ICD-10-CM | POA: Diagnosis not present

## 2018-08-16 DIAGNOSIS — Z8719 Personal history of other diseases of the digestive system: Secondary | ICD-10-CM | POA: Diagnosis not present

## 2018-08-16 DIAGNOSIS — K5900 Constipation, unspecified: Secondary | ICD-10-CM | POA: Diagnosis not present

## 2018-08-16 DIAGNOSIS — G40909 Epilepsy, unspecified, not intractable, without status epilepticus: Secondary | ICD-10-CM | POA: Insufficient documentation

## 2018-08-16 DIAGNOSIS — Z8673 Personal history of transient ischemic attack (TIA), and cerebral infarction without residual deficits: Secondary | ICD-10-CM | POA: Insufficient documentation

## 2018-08-16 DIAGNOSIS — M797 Fibromyalgia: Secondary | ICD-10-CM | POA: Diagnosis not present

## 2018-08-16 DIAGNOSIS — I10 Essential (primary) hypertension: Secondary | ICD-10-CM | POA: Insufficient documentation

## 2018-08-16 DIAGNOSIS — M549 Dorsalgia, unspecified: Secondary | ICD-10-CM

## 2018-08-16 DIAGNOSIS — F329 Major depressive disorder, single episode, unspecified: Secondary | ICD-10-CM | POA: Diagnosis not present

## 2018-08-16 DIAGNOSIS — G4730 Sleep apnea, unspecified: Secondary | ICD-10-CM | POA: Insufficient documentation

## 2018-08-16 NOTE — Progress Notes (Signed)
[place], [place] [address]   CLINIC:  Medical Oncology/Hematology  PCP:  [name], MD [address] [place] [address] [phone]   REASON FOR VISIT:  Follow-up for Microcytic anemia  CURRENT THERAPY: Clinical Surveillance     INTERVAL HISTORY:  [name] 68 y.o. female  Presents today for follow up. She reports overall doing fair. Reports moderate fatigue. She reports a "cyst" in her left upper extremity that is being managed by an orthopedic surgeon. She denies any episodes of bleeding. Denies any changes in bowel habits. No abdominal pains. No weight loss. Denies any fevers, chills, night sweats. No weight loss. She is here for repeat labs and office visit.    REVIEW OF SYSTEMS:  Review of Systems  Constitutional: Negative.   HENT:  Negative.   Eyes: Negative.   Respiratory: Negative.   Cardiovascular: Negative.   Gastrointestinal: Negative.   Endocrine: Negative.   Genitourinary: Negative.    Musculoskeletal: Positive for arthralgias and myalgias.  Skin: Negative.   Neurological: Negative.   Hematological: Positive for adenopathy.  Psychiatric/Behavioral: Negative.      PAST MEDICAL/SURGICAL HISTORY:  Past Medical History:  Diagnosis Date  . Allergy   . Anemia   . Anxiety    takes Ativan daily  . Arthritis   . Assistance needed for mobility   . Bipolar disorder ([place])    takes Risperdal nightly  . Blood transfusion   . Cancer ([place])    In her gum  . Carpal tunnel syndrome of right wrist 05/23/2011  . Cervical disc disorder with radiculopathy of cervical region 10/31/2012  . Chronic back pain   . Chronic idiopathic constipation   . Chronic neck and back pain   . Colon polyps   . COPD (chronic obstructive pulmonary disease) with chronic bronchitis ([place]) 09/16/2013   Office Spirometry 10/30/2013-submaximal effort based on appearance of loop and curve. Numbers would fit with severe restriction  but her physiologic capability may be better than this. FVC 0.91/44%, and 10.74/45%, FEV1/FVC 0.81, FEF 25-75% 1.43/69%    . Depression    takes Zoloft daily  . Diabetes mellitus    Type II  . Diverticulosis    TCS 9/08 by Dr. Delfin Edis for diarrhea . Bx for micro scopic colitis negative.   . Fibromyalgia   . Frequent falls   . GERD (gastroesophageal reflux disease)    takes Aciphex daily  . Glaucoma    eye drops daily  . Gum symptoms    infection on antibiotic  . Hemiplegia affecting non-dominant side, post-stroke 08/02/2011  . Hyperlipidemia    takes Crestor daily  . Hypertension    takes Amlodipine,Metoprolol,and Clonidine daily  . Hypothyroidism    takes Synthroid daily  . IBS (irritable bowel syndrome)   . Insomnia    takes Trazodone nightly  . 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 Metabolic encephalopathy 03/21/3233  . Migraines    chronic headaches  . Mononeuritis lower limb   . Narcolepsy   . Osteoporosis   . Pancreatitis 2006   due to Depakote with normal EUS   . Schatzki's ring    non critical / EGD with ED 8/2011with RMR  . Seizures ([place])    takes Lamictal daily.Last seizure 3 yrs ago  . Sleep apnea    on CPAP  . Stroke [facility])    left sided weakness, speech changes  . Tubular adenoma of colon    Past Surgical  History:  Procedure Laterality Date  . ABDOMINAL HYSTERECTOMY  1978  . BACK SURGERY  July 2012  . BACTERIAL OVERGROWTH TEST N/A 05/05/2013   Procedure: BACTERIAL OVERGROWTH TEST;  Surgeon: Daneil Dolin, MD;  [facility];  Service: Endoscopy;  Laterality: N/A;  7:30  . BIOPSY THYROID  2009  . BRAIN SURGERY  11/2011   resection of meningioma  . BREAST REDUCTION SURGERY  1994  . CARDIAC CATHETERIZATION  05/10/2005   normal coronaries, normal LV systolic function and EF (Dr. Jackie Plum)  . CARPAL TUNNEL RELEASE Left 07/22/04   Dr. Aline Brochure  . CATARACT EXTRACTION Bilateral   . CHOLECYSTECTOMY  1984  . COLONOSCOPY N/A 09/25/2012    YBO:FBPZWCH diverticulosis.  colonic polyp-removed : tubular adenoma  . CRANIOTOMY  11/23/2011   Procedure: CRANIOTOMY TUMOR EXCISION;  Surgeon: Hosie Spangle, MD;  Location: [facility]URO ORS;  Service: Neurosurgery;  Laterality: N/A;  Craniotomy for tumor resection  . ESOPHAGOGASTRODUODENOSCOPY  12/29/2010   Rourk-Retained food in the esophagus and stomach, small hiatal hernia, status post Maloney dilation of the esophagus  . ESOPHAGOGASTRODUODENOSCOPY N/A 09/25/2012   ENI:DPOEUMPN atonic baggy esophagus status post Maloney dilation 53 F. Hiatal hernia  . GIVENS CAPSULE STUDY N/A 01/15/2013   NORMAL.   . IR GENERIC HISTORICAL  03/17/2016   IR RADIOLOGIST EVAL & MGMT 03/17/2016 [facility]-INTERV RAD  . LESION REMOVAL N/A 05/31/2015   Procedure: REMOVAL RIGHT AND LEFT LESIONS OF MANDIBLE;  Surgeon: Diona Browner, DDS;  Location: [facility];  Service: Oral Surgery;  Laterality: N/A;  . MALONEY DILATION  12/29/2010   RMR;  . NM MYOCAR PERF WALL MOTION  2006   "relavtiely normal" persantine, mild anterior thinning (breast attenuation artifact), no region of scar/ischemia  . OVARIAN CYST REMOVAL    . RECTOCELE REPAIR N/A 06/29/2015   Procedure: POSTERIOR REPAIR (RECTOCELE);  Surgeon: Jonnie Kind, MD;  Location: [facility];  Service: Gynecology;  Laterality: N/A;  . REDUCTION MAMMAPLASTY Bilateral   . SPINE SURGERY  09/29/2010   Dr. Rolena Infante  . surgical excision of 3 tumors from right thigh and right buttock  and left upper thigh  2010  . TOOTH EXTRACTION Bilateral 12/14/2014   Procedure: REMOVAL OF BILATERAL MANDIBULAR EXOSTOSES;  Surgeon: Diona Browner, DDS;  Location: [facility];  Service: Oral Surgery;  Laterality: Bilateral;  . TRANSTHORACIC ECHOCARDIOGRAM  2010   EF 60-65%, mild conc LVH, grade 1 diastolic dysfunction; mildly calcified MV annulus with mildly thickened leaflets, mildly calcified MR annulus     SOCIAL HISTORY:  Social History   Socioeconomic History  . Marital status: Divorced    Spouse name: Not  on file  . Number of children: 1  . Years of education: 62  . Highest education level: Not on file  Occupational History  . Occupation: Disabled  Social Needs  . Financial resource strain: Not very hard  . Food insecurity:    Worry: Never true    Inability: Never true  . Transportation needs:    Medical: No    Non-medical: Not on file  Tobacco Use  . Smoking status: Current Every Day Smoker    Packs/day: 0.50    Years: 30.00    Pack years: 15.00    Types: Cigarettes  . Smokeless tobacco: Never Used  . Tobacco comment: 5-10 cigs a day  Substance and Sexual Activity  . Alcohol use: No    Alcohol/week: 0.0 standard drinks    Comment:    . Drug use:  No  . Sexual activity: Not Currently  Lifestyle  . Physical activity:    Days per week: 0 days    Minutes per session: 0 min  . Stress: Only a little  Relationships  . Social connections:    Talks on phone: More than three times a week    Gets together: Never    Attends religious service: 1 to 4 times per year    Active member of club or organization: No    Attends meetings of clubs or organizations: Never    Relationship status: Divorced  . Intimate partner violence:    Fear of current or ex partner: No    Emotionally abused: No    Physically abused: No    Forced sexual activity: No  Other Topics Concern  . Not on file  Social History Narrative   01/29/18 Lives alone, has 3 aides, Mon- Fri 8 hrs, 2 hrs on Sat-Sun, RN manages her meds   Caffeine use: Drink coffee sometimes    Right handed     FAMILY HISTORY:  Family History  Problem Relation Age of Onset  . Heart attack Mother        HTN  . Pneumonia Father   . Kidney failure Father   . Diabetes Father   . Pancreatic cancer Sister   . Diabetes Brother   . Hypertension Brother   . Diabetes Brother   . Cancer Sister        breast   . Hypertension Son   . Sleep apnea Son   . Cancer Sister        pancreatic  . Stroke Maternal Grandmother   . Heart attack  Maternal Grandfather   . Alcohol abuse Maternal Uncle   . Colon cancer Neg Hx   . Anesthesia problems Neg Hx   . Hypotension Neg Hx   . Malignant hyperthermia Neg Hx   . Pseudochol deficiency Neg Hx     CURRENT MEDICATIONS:  Outpatient Encounter Medications as of 08/16/2018  Medication Sig Note  . ACCU-CHEK FASTCLIX LANCETS MISC Use to check blood sugar 4 times a day   . albuterol (VENTOLIN HFA) 108 (90 Base) MCG/ACT inhaler Inhale 1-2 puffs every 6 hours as needed for wheezing, shortness of breath   . amLODipine (NORVASC) 10 MG tablet Take 1 tablet (10 mg total) by mouth daily.   [name] ascorbic acid (VITAMIN C) 500 MG tablet Take 500 mg by mouth daily.   [name] aspirin EC 81 MG tablet Take 81 mg by mouth daily.   . Blood Glucose Monitoring Suppl (ACCU-CHEK AVIVA) device Use as instructed   . cetirizine (ZYRTEC) 10 MG tablet Take 1 tablet (10 mg total) by mouth daily.   . Cholecalciferol (VITAMIN D3) 5000 units CAPS Take 1 capsule by mouth daily.   . clobetasol cream (TEMOVATE) 0.05 %    . clotrimazole-betamethasone (LOTRISONE) cream Apply sparingly twice daily to rash on face for 1 week, then only as needed   . diclofenac sodium (VOLTAREN) 1 % GEL Apply 2 g topically 4 (four) times daily.   . DROPLET PEN NEEDLES 32G X 4 MM MISC USE  TO INJECT FOUR TIMES DAILY   . glucose blood (ACCU-CHEK AVIVA) test strip Use to check blood sugar 4 times a day   . hydroxychloroquine (PLAQUENIL) 200 MG tablet Take 200 mg by mouth daily.    . hydrOXYzine (ATARAX/VISTARIL) 10 MG tablet Take 1 tablet (10 mg total) by mouth 3 (three) times daily as needed.   [name]  insulin aspart (NOVOLOG FLEXPEN) 100 UNIT/ML FlexPen INJECT 10 TO 14 UNITS INTO THE SKIN 3 (THREE) TIMES DAILY WITH MEALS.   [name] Insulin Glargine (TOUJEO SOLOSTAR) 300 UNIT/ML SOPN Inject 20 Units into the skin at bedtime. 06/11/2018: Takes 27 units   . lamoTRIgine (LAMICTAL) 100 MG tablet Take 1 tablet (100 mg total) by mouth 2 (two) times daily.   [name] levothyroxine  (SYNTHROID, LEVOTHROID) 50 MCG tablet TAKE 1 TABLET  DAILY  EXCEPT TAKE 1/2 TABLET  ON  SUNDAY   . LORazepam (ATIVAN) 0.5 MG tablet Take 1 tablet (0.5 mg total) by mouth 2 (two) times daily.   [name] losartan (COZAAR) 50 MG tablet TAKE 1 TABLET EVERY DAY   . metaxalone (SKELAXIN) 800 MG tablet metaxalone 800 mg tablet  take 1 tablet by mouth 3 times daily as needed.   . metoprolol tartrate (LOPRESSOR) 50 MG tablet TAKE 1 TABLET TWICE DAILY (NEED MD APPOINTMENT)   . montelukast (SINGULAIR) 10 MG tablet TAKE 1 TABLET EVERY DAY   . Multiple Vitamins-Minerals (HM MULTIVITAMIN ADULT GUMMY PO) Take 1 tablet by mouth daily.   . Nicotine (NICOTROL NS) 10 MG/ML SOLN Use 1 spray per nostril up to eight times per day as needed   . nystatin (MYCOSTATIN/NYSTOP) powder APPLY TO AFFECTED AREA 4 TIMES DAILY.   [name] ondansetron (ZOFRAN) 4 MG tablet Take 1 tablet by mouth every 6 hours as needed for nausea.   [name] oxyCODONE-acetaminophen (PERCOCET) 10-325 MG tablet    . pregabalin (LYRICA) 75 MG capsule Take 1 capsule (75 mg total) by mouth daily.   . RABEprazole (ACIPHEX) 20 MG tablet Take 1 tablet (20 mg total) by mouth 2 (two) times daily.   . RESTASIS 0.05 % ophthalmic emulsion Place 1 drop into both eyes 2 (two) times daily.    . risperiDONE (RISPERDAL) 0.5 MG tablet Take 1 tablet (0.5 mg total) by mouth at bedtime.   . rosuvastatin (CRESTOR) 5 MG tablet TAKE 1 TABLET AT BEDTIME   . sertraline (ZOLOFT) 100 MG tablet Take 1 tablet (100 mg total) by mouth daily.   . Tiotropium Bromide-Olodaterol (STIOLTO RESPIMAT) 2.5-2.5 MCG/ACT AERS Inhale 2 puffs into the lungs daily.   . TOUJEO SOLOSTAR 300 UNIT/ML SOPN INJECT 20 UNITS INTO THE SKIN AT BEDTIME.   . traZODone (DESYREL) 150 MG tablet Take 0.5 tablets (75 mg total) by mouth at bedtime.   . triamcinolone ointment (KENALOG) 0.1 % Apply 1 application topically at bedtime. Apply to elbows at bedtime.   [name] umeclidinium-vilanterol (ANORO ELLIPTA) 62.5-25 MCG/INH AEPB Inhale 1  puff into the lungs daily.   [name] UNABLE TO FIND Rolling walker with seat x 1  DX. Bilateral lower extremity weakness, frequent falls, instability   . docusate sodium (COLACE) 100 MG capsule Take 100 mg by mouth 2 (two) times daily.   [name] UNABLE TO FIND Transport Chair x 1  Dx-bilateral leg weakness, frequent falls (Patient not taking: Reported on 07/09/2018)   . UNABLE TO FIND Lift chair x 1  DX M47.16, R29.6 (Patient not taking: Reported on 07/09/2018)    No facility-administered encounter medications on file as of 08/16/2018.     ALLERGIES:  Allergies  Allergen Reactions  . Cephalexin Hives  . Iron Nausea And Vomiting  . Milk-Related Compounds Other (See Comments)    Doesn't agree with stomach.   . Penicillins Hives    Has patient had a PCN reaction causing immediate rash, facial/tongue/throat swelling, SOB or lightheadedness with hypotension: Yes Has patient had a  PCN reaction causing severe rash involving mucus membranes or skin necrosis: No Has patient had a PCN reaction that required hospitalization No Has patient had a PCN reaction occurring within the last 10 years: No If all of the above answers are "NO", then may proceed with Cephalosporin use.   [name] Phenazopyridine Hcl Hives           PHYSICAL EXAM:  ECOG Performance status: 2  Vitals:   08/16/18 1028  BP: (!) 142/63  Pulse: 63  Resp: 20  Temp: 98.7 F (37.1 C)  SpO2: 95%   Filed Weights   08/16/18 1028  Weight: 177 lb (80.3 kg)    Physical Exam Constitutional:      Appearance: Normal appearance. She is obese.  HENT:     Head: Normocephalic.     Nose: Nose normal.     Mouth/Throat:     Mouth: Mucous membranes are moist.     Pharynx: Oropharynx is clear.  Eyes:     Extraocular Movements: Extraocular movements intact.     Conjunctiva/sclera: Conjunctivae normal.  Neck:     Musculoskeletal: Normal range of motion.  Cardiovascular:     Rate and Rhythm: Normal rate and regular rhythm.     Pulses: Normal  pulses.     Heart sounds: Normal heart sounds.  Pulmonary:     Effort: Pulmonary effort is normal.     Breath sounds: Normal breath sounds.  Abdominal:     General: Bowel sounds are normal.     Palpations: Abdomen is soft.  Musculoskeletal: Normal range of motion.  Skin:    General: Skin is warm.  Neurological:     General: No focal deficit present.     Mental Status: She is alert. She is disoriented.  Psychiatric:        Mood and Affect: Mood normal.        Behavior: Behavior normal.        Thought Content: Thought content normal.        Judgment: Judgment normal.      LABORATORY DATA:  I have reviewed the labs as listed.  CBC    Component Value Date/Time   WBC 7.9 08/09/2018 1254   RBC 4.52 08/09/2018 1254   HGB 10.4 (L) 08/09/2018 1254   HGB 10.3 (L) 01/29/2018 1315   HCT 36.1 08/09/2018 1254   HCT 33.8 (L) 01/29/2018 1315   PLT 230 08/09/2018 1254   PLT 275 01/29/2018 1315   MCV 79.9 (L) 08/09/2018 1254   MCV 75 (L) 01/29/2018 1315   MCH 23.0 (L) 08/09/2018 1254   MCHC 28.8 (L) 08/09/2018 1254   RDW 14.3 08/09/2018 1254   RDW 12.8 01/29/2018 1315   LYMPHSABS 2.0 08/09/2018 1254   LYMPHSABS 2.4 01/29/2018 1315   MONOABS 0.7 08/09/2018 1254   EOSABS 0.2 08/09/2018 1254   EOSABS 0.2 01/29/2018 1315   BASOSABS 0.0 08/09/2018 1254   BASOSABS 0.0 01/29/2018 1315   CMP Latest Ref Rng & Units 08/09/2018 04/26/2018 02/15/2018  Glucose 70 - 99 mg/dL 142(H) 134(H) 61(L)  BUN 8 - 23 mg/dL 23 28(H) 21  Creatinine 0.44 - 1.00 mg/dL 1.03(H) 1.02(H) 0.89  Sodium 135 - 145 mmol/L 143 138 144  Potassium 3.5 - 5.1 mmol/L 4.0 3.9 3.5  Chloride 98 - 111 mmol/L 111 106 114(H)  CO2 22 - 32 mmol/L 22 24 24   Calcium 8.9 - 10.3 mg/dL 9.1 8.8(L) 9.0  Total Protein 6.5 - 8.1 g/dL 7.0 7.3 7.1  Total Bilirubin  0.3 - 1.2 mg/dL 0.6 0.4 0.2(L)  Alkaline Phos 38 - 126 U/L 99 149(H) 93  AST 15 - 41 U/L 24 24 23   ALT 0 - 44 U/L 25 26 22        ASSESSMENT & PLAN:   Microcytic anemia  1.  Microcytic anemia: - It is a combination anemia from iron deficiency and chronic kidney disease. - Colonoscopy on 02/10/2016 showing 2 sessile polyps in the descending colon, diverticuli in sigmoid colon, EGD on 02/10/2016 showing mild to moderate esophagitis, erythema in the inferior gastric antrum -Hemoglobin on 05/19/2017 was 8.6 with an MCV of 72.  She was put on iron pills, but she could not tolerate because of constipation.   - She received IV Feraheme x 2 doses in January 2020. - Labs today reveal stable hemoglobin at 10.4, MCV: 79.9, Ferritin: 182. Overall, pt is stable.  - Plan to repeat labs in 4 mths.       Orders placed this encounter:  Orders Placed This Encounter  Procedures  . CBC with Differential  . Comprehensive metabolic panel  . Ferritin  . Iron and TIBC      Roger Shelter, [place] [phone]

## 2018-08-16 NOTE — Assessment & Plan Note (Signed)
1.  Microcytic anemia: - It is a combination anemia from iron deficiency and chronic kidney disease. - Colonoscopy on 02/10/2016 showing 2 sessile polyps in the descending colon, diverticuli in sigmoid colon, EGD on 02/10/2016 showing mild to moderate esophagitis, erythema in the inferior gastric antrum -Hemoglobin on 05/19/2017 was 8.6 with an MCV of 72.  She was put on iron pills, but she could not tolerate because of constipation.   - She received IV Feraheme x 2 doses in January 2020. - Labs today reveal stable hemoglobin at 10.4, MCV: 79.9, Ferritin: 182. Overall, pt is stable.  - Plan to repeat labs in 4 mths.

## 2018-08-19 DIAGNOSIS — Z794 Long term (current) use of insulin: Secondary | ICD-10-CM | POA: Diagnosis not present

## 2018-08-19 DIAGNOSIS — M159 Polyosteoarthritis, unspecified: Secondary | ICD-10-CM | POA: Diagnosis not present

## 2018-08-19 DIAGNOSIS — I110 Hypertensive heart disease with heart failure: Secondary | ICD-10-CM | POA: Diagnosis not present

## 2018-08-19 DIAGNOSIS — I69354 Hemiplegia and hemiparesis following cerebral infarction affecting left non-dominant side: Secondary | ICD-10-CM | POA: Diagnosis not present

## 2018-08-19 DIAGNOSIS — E1142 Type 2 diabetes mellitus with diabetic polyneuropathy: Secondary | ICD-10-CM | POA: Diagnosis not present

## 2018-08-19 DIAGNOSIS — M5030 Other cervical disc degeneration, unspecified cervical region: Secondary | ICD-10-CM | POA: Diagnosis not present

## 2018-08-19 DIAGNOSIS — J449 Chronic obstructive pulmonary disease, unspecified: Secondary | ICD-10-CM | POA: Diagnosis not present

## 2018-08-19 DIAGNOSIS — M6281 Muscle weakness (generalized): Secondary | ICD-10-CM | POA: Diagnosis not present

## 2018-08-19 DIAGNOSIS — M7542 Impingement syndrome of left shoulder: Secondary | ICD-10-CM | POA: Diagnosis not present

## 2018-08-20 DIAGNOSIS — M159 Polyosteoarthritis, unspecified: Secondary | ICD-10-CM | POA: Diagnosis not present

## 2018-08-20 DIAGNOSIS — J449 Chronic obstructive pulmonary disease, unspecified: Secondary | ICD-10-CM | POA: Diagnosis not present

## 2018-08-20 DIAGNOSIS — Z794 Long term (current) use of insulin: Secondary | ICD-10-CM | POA: Diagnosis not present

## 2018-08-20 DIAGNOSIS — I69354 Hemiplegia and hemiparesis following cerebral infarction affecting left non-dominant side: Secondary | ICD-10-CM | POA: Diagnosis not present

## 2018-08-20 DIAGNOSIS — M7542 Impingement syndrome of left shoulder: Secondary | ICD-10-CM | POA: Diagnosis not present

## 2018-08-20 DIAGNOSIS — M6281 Muscle weakness (generalized): Secondary | ICD-10-CM | POA: Diagnosis not present

## 2018-08-20 DIAGNOSIS — E1142 Type 2 diabetes mellitus with diabetic polyneuropathy: Secondary | ICD-10-CM | POA: Diagnosis not present

## 2018-08-20 DIAGNOSIS — I110 Hypertensive heart disease with heart failure: Secondary | ICD-10-CM | POA: Diagnosis not present

## 2018-08-20 DIAGNOSIS — M5030 Other cervical disc degeneration, unspecified cervical region: Secondary | ICD-10-CM | POA: Diagnosis not present

## 2018-08-21 DIAGNOSIS — E1142 Type 2 diabetes mellitus with diabetic polyneuropathy: Secondary | ICD-10-CM | POA: Diagnosis not present

## 2018-08-22 DIAGNOSIS — M5030 Other cervical disc degeneration, unspecified cervical region: Secondary | ICD-10-CM | POA: Diagnosis not present

## 2018-08-22 DIAGNOSIS — Z794 Long term (current) use of insulin: Secondary | ICD-10-CM | POA: Diagnosis not present

## 2018-08-22 DIAGNOSIS — I110 Hypertensive heart disease with heart failure: Secondary | ICD-10-CM | POA: Diagnosis not present

## 2018-08-22 DIAGNOSIS — M6281 Muscle weakness (generalized): Secondary | ICD-10-CM | POA: Diagnosis not present

## 2018-08-22 DIAGNOSIS — M7542 Impingement syndrome of left shoulder: Secondary | ICD-10-CM | POA: Diagnosis not present

## 2018-08-22 DIAGNOSIS — J449 Chronic obstructive pulmonary disease, unspecified: Secondary | ICD-10-CM | POA: Diagnosis not present

## 2018-08-22 DIAGNOSIS — M159 Polyosteoarthritis, unspecified: Secondary | ICD-10-CM | POA: Diagnosis not present

## 2018-08-22 DIAGNOSIS — E1142 Type 2 diabetes mellitus with diabetic polyneuropathy: Secondary | ICD-10-CM | POA: Diagnosis not present

## 2018-08-22 DIAGNOSIS — I69354 Hemiplegia and hemiparesis following cerebral infarction affecting left non-dominant side: Secondary | ICD-10-CM | POA: Diagnosis not present

## 2018-08-23 ENCOUNTER — Encounter: Payer: Self-pay | Admitting: Internal Medicine

## 2018-08-23 ENCOUNTER — Ambulatory Visit (INDEPENDENT_AMBULATORY_CARE_PROVIDER_SITE_OTHER): Payer: Medicare HMO | Admitting: Internal Medicine

## 2018-08-23 ENCOUNTER — Other Ambulatory Visit: Payer: Self-pay

## 2018-08-23 DIAGNOSIS — E1165 Type 2 diabetes mellitus with hyperglycemia: Secondary | ICD-10-CM

## 2018-08-23 DIAGNOSIS — E039 Hypothyroidism, unspecified: Secondary | ICD-10-CM

## 2018-08-23 DIAGNOSIS — Z794 Long term (current) use of insulin: Secondary | ICD-10-CM | POA: Diagnosis not present

## 2018-08-23 DIAGNOSIS — E042 Nontoxic multinodular goiter: Secondary | ICD-10-CM

## 2018-08-23 NOTE — Progress Notes (Signed)
Patient ID: [name], female   DOB: [birth year], 68 y.o.   MRN: [id number]  Patient location: Home My location: Office  Referring Provider: [name], MD  I connected with the patient on 08/23/18 at  3:27 PM EDT by telephone and verified that I am speaking with the correct person.   I discussed the limitations of evaluation and management by telephone and the availability of in person appointments. The patient expressed understanding and agreed to proceed.   Details of the encounter are shown below.  HPI: [name] is a 68 y.o.-year-old female, initially referred by her PCP, [name], presenting for follow-up for DM2, dx 1995, insulin-dependent since 1997, uncontrolled, with complications (gastroparesis, cerebrovasc. Ds-  H/o stroke, PN),  Hypothyroidism, thyroid nodules.  Last visit 1.5 months ago.  At last visit, sugars are higher as she was on prednisone (started 06/11/2018) for elbow and arm pain and ABx for a sinus infection. The courses lasted for 5 days.  She had repeated steroid injections for arm pain but had a recent MRI >> torn tendons in shoulder >> no more steroid injections >> will have surgery.  She has a recurrent meningioma >> seeing Dr. Rita Ohara.  DM2: Last hemoglobin A1c was: Lab Results  Component Value Date   HGBA1C 5.1 02/18/2018   HGBA1C 5.8 (A) 10/09/2017   HGBA1C 5.8 06/07/2017  10/28/2013: HbA1c 8.5%  She is on: - Toujeo 23 >> 25 units at bedtime - Novolog: 10-14 units before a meal, but use 12 units before b'fast Could not tolerate Metformin >> nausea.   Insulin doses were decreased in 04/2017 due to low blood sugars in the 40s.  Pt checks her sugars 4 times a day: - am: 80-141, 425 (steroid inj) >> 91, 124, 160-177, 215 >> 84-144, 230 - 2h after b'fast: n/c >> 110 >> n/c - before lunch: 65, 74-144, 225, 247 >> 111, 136-196, 215, 245 >> 123-149, 174, 198 - 2h after lunch: 127-154, 229 >> n/c >> 115 >> n/c - before dinner:  92-145,  171, 218 >> 89-151 >> 134 >> 89, 123-198, 222, 234 - 2h after dinner: n/c - bedtime: 102-151, 166 >> 129, 155, 156 >> 146-219, 240, 249 - nighttime: n/c Lowest sugar was 65 >> 89 >> 84; she has hypoglycemia awareness in the 70s. Highest sugar was 425 >> 291 >> 249.  Pt's meals are: - Breakfast: eggs, cereals, fruit, oatmeal, bacon - Lunch: sandwich, beef, mashed potatoes,diet soda - Dinner: chicken, beef, fish, potatoes, vegetables - Snacks: 1-2: fruit  -+ Mild CKD, last BUN/creatinine:  Lab Results  Component Value Date   BUN 23 08/09/2018   CREATININE 1.03 (H) 08/09/2018  On losartan. -+ HL; last set of lipids: Lab Results  Component Value Date   CHOL 143 02/11/2018   HDL 53 02/11/2018   LDLCALC 72 02/11/2018   TRIG 89 02/11/2018   CHOLHDL 3.1 07/19/2015  On Crestor and Zetia - last eye exam was in 2020: + DR; Had cataract sx B. Dr. Hassell Done [facility]). -+ Numbness and tingling mainly in the left leg- affected by stroke. Sees podiatry.  Hypothyroidism: -Diagnosed many years ago  Pt is on levothyroxine 50 mcg 6/7 days and 25 mcg 1/7 days, taken: - in am - fasting - at least 30 min from b'fast - no Ca, Fe, PPIs - + MVI and AcipHex with lunch and AcipHex with dinner - + Biotin in B complex  Latest TSH was normal: Lab Results  Component Value Date  TSH 0.52 10/08/2017   TSH 0.827 10/15/2016   TSH 0.906 01/22/2016   TSH 1.030 11/03/2015   TSH 1.207 02/21/2015   Thyroid nodules.    Pt denies: - feeling nodules in neck - hoarseness - dysphagia - choking - SOB with lying down  She has chronic dysphagia with certain foods.  She had previous esophageal dilations.   Reviewed her imaging and biopsy tests: thyroid U/S (10/2013):  Right thyroid lobe: 3.7 x 1.0 x 1.8 cm   Left thyroid lobe: 3.5 x 1.5 x 1.6 cm   Isthmus: 0.5 cm   Focal nodules: There is a 0.3 mm calcified nodule in the right midzone. There is a 0.6 x 0.7 x 0.5 cm hypoechoic nodule in the  lateral  aspect of the right midzone. There is a 0.7 x 0.6 x 0.5 cm hyperechoic nodule in the lateral aspect of the right lower pole.  There is a 2.2 x 1.1 x 1.7 cm complex solid nodule in the inferior aspect of the isthmus extending adjacent to the left lower pole.  There is a 1.4 x 2.1 x 1.4 cm complex solid nodule in the left lower pole.   Lymphadenopathy: None visualized.  Repeat U/S (01/2014): Stable appearance of the nodules  FNA (2014) x2: Benign  Repeat U/S (10/2015): stable appearance of the nodules  Repeat U/S (10/2016): stable appearance of the nodules  She had a L rib fracture in 12/2014.  Patient was admitted with AMS + sepsis on 10/15/2016.  She had aspiration pneumonia and acute respiratory failure  ROS: Constitutional: no weight gain/no weight loss, no fatigue, no subjective hyperthermia, no subjective hypothermia Eyes: no blurry vision, no xerophthalmia ENT: no sore throat, + see HPI Cardiovascular: no CP/no SOB/no palpitations/no leg swelling Respiratory: no cough/no SOB/no wheezing Gastrointestinal: no N/no V/no D/no C/no acid reflux Musculoskeletal: no muscle aches/no joint aches Skin: no rashes, no hair loss Neurological: no tremors/no numbness/no tingling/no dizziness  I reviewed pt's medications, allergies, PMH, social hx, family hx, and changes were documented in the history of present illness. Otherwise, unchanged from my initial visit note.  Past Medical History:  Diagnosis Date  . Allergy   . Anemia   . Anxiety    takes Ativan daily  . Arthritis   . Assistance needed for mobility   . Bipolar disorder ([place])    takes Risperdal nightly  . Blood transfusion   . Cancer ([place])    In her gum  . Carpal tunnel syndrome of right wrist 05/23/2011  . Cervical disc disorder with radiculopathy of cervical region 10/31/2012  . Chronic back pain   . Chronic idiopathic constipation   . Chronic neck and back pain   . Colon polyps   . COPD (chronic obstructive pulmonary  disease) with chronic bronchitis ([place]) 09/16/2013   Office Spirometry 10/30/2013-submaximal effort based on appearance of loop and curve. Numbers would fit with severe restriction but her physiologic capability may be better than this. FVC 0.91/44%, and 10.74/45%, FEV1/FVC 0.81, FEF 25-75% 1.43/69%    . Depression    takes Zoloft daily  . Diabetes mellitus    Type II  . Diverticulosis    TCS 9/08 by Dr. Delfin Edis for diarrhea . Bx for micro scopic colitis negative.   . Fibromyalgia   . Frequent falls   . GERD (gastroesophageal reflux disease)    takes Aciphex daily  . Glaucoma    eye drops daily  . Gum symptoms    infection on antibiotic  .  Hemiplegia affecting non-dominant side, post-stroke 08/02/2011  . Hyperlipidemia    takes Crestor daily  . Hypertension    takes Amlodipine,Metoprolol,and Clonidine daily  . Hypothyroidism    takes Synthroid daily  . IBS (irritable bowel syndrome)   . Insomnia    takes Trazodone nightly  . 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 Metabolic encephalopathy 9/83/3825  . Migraines    chronic headaches  . Mononeuritis lower limb   . Narcolepsy   . Osteoporosis   . Pancreatitis 2006   due to Depakote with normal EUS   . Schatzki's ring    non critical / EGD with ED 8/2011with RMR  . Seizures ([place])    takes Lamictal daily.Last seizure 3 yrs ago  . Sleep apnea    on CPAP  . Stroke [facility])    left sided weakness, speech changes  . Tubular adenoma of colon    Past Surgical History:  Procedure Laterality Date  . ABDOMINAL HYSTERECTOMY  1978  . BACK SURGERY  July 2012  . BACTERIAL OVERGROWTH TEST N/A 05/05/2013   Procedure: BACTERIAL OVERGROWTH TEST;  Surgeon: Daneil Dolin, MD;  [facility];  Service: Endoscopy;  Laterality: N/A;  7:30  . BIOPSY THYROID  2009  . BRAIN SURGERY  11/2011   resection of meningioma  . BREAST REDUCTION SURGERY  1994  . CARDIAC CATHETERIZATION  05/10/2005   normal coronaries, normal LV systolic function  and EF (Dr. Jackie Plum)  . CARPAL TUNNEL RELEASE Left 07/22/04   Dr. Aline Brochure  . CATARACT EXTRACTION Bilateral   . CHOLECYSTECTOMY  1984  . COLONOSCOPY N/A 09/25/2012   KNL:ZJQBHAL diverticulosis.  colonic polyp-removed : tubular adenoma  . CRANIOTOMY  11/23/2011   Procedure: CRANIOTOMY TUMOR EXCISION;  Surgeon: Hosie Spangle, MD;  Location: [place] NEURO ORS;  Service: Neurosurgery;  Laterality: N/A;  Craniotomy for tumor resection  . ESOPHAGOGASTRODUODENOSCOPY  12/29/2010   Rourk-Retained food in the esophagus and stomach, small hiatal hernia, status post Maloney dilation of the esophagus  . ESOPHAGOGASTRODUODENOSCOPY N/A 09/25/2012   [id number] atonic baggy esophagus status post Maloney dilation 64 F. Hiatal hernia  . GIVENS CAPSULE STUDY N/A 01/15/2013   NORMAL.   . IR GENERIC HISTORICAL  03/17/2016   IR RADIOLOGIST EVAL & MGMT 03/17/2016 [facility]-INTER[facility] RAD  . LESION REMOVAL N/A 05/31/2015   Procedure: REMOVAL RIGHT AND LEFT LESIONS OF MANDIBLE;  Surgeon: Diona Browner, DDS;  Location: [place];  Service: Oral Surgery;  Laterality: N/A;  . MALONEY DILATION  12/29/2010   RMR;  . NM MYOCAR PERF WALL MOTION  2006   "relavtiely normal" persantine, mild anterior thinning (breast attenuation artifact), no region of scar/ischemia  . OVARIAN CYST REMOVAL    . RECTOCELE REPAIR N/A 06/29/2015   Procedure: POSTERIOR REPAIR (RECTOCELE);  Surgeon: Jonnie Kind, MD;  Location: [facility];  Service: Gynecology;  Laterality: N/A;  . REDUCTION MAMMAPLASTY Bilateral   . SPINE SURGERY  09/29/2010   Dr. Rolena Infante  . surgical excision of 3 tumors from right thigh and right buttock  and left upper thigh  2010  . TOOTH EXTRACTION Bilateral 12/14/2014   Procedure: REMOVAL OF BILATERAL MANDIBULAR EXOSTOSES;  Surgeon: Diona Browner, DDS;  Location: [place];  Service: Oral Surgery;  Laterality: Bilateral;  . TRANSTHORACIC ECHOCARDIOGRAM  2010   EF 60-65%, mild conc LVH, grade 1 diastolic dysfunction; mildly calcified MV annulus with  mildly thickened leaflets, mildly calcified MR annulus   History   Social History  . Marital Status: Divorced  Spouse Name: N/A    Number of Children: 1  . Years of Education: 12   Occupational History  . disabled     Social History Main Topics  . Smoking status: Current Every Day Smoker -- 0.25 packs/day for 7 years    Types: Cigarettes  . Smokeless tobacco: Never Used     Comment: "started back but off now for 3 months" (08/18/13)  . Alcohol Use: No     Comment:    . Drug Use: No   Current Outpatient Medications on File Prior to Visit  Medication Sig Dispense Refill  . ACCU-CHEK FASTCLIX LANCETS MISC Use to check blood sugar 4 times a day 306 each 0  . albuterol (VENTOLIN HFA) 108 (90 Base) MCG/ACT inhaler Inhale 1-2 puffs every 6 hours as needed for wheezing, shortness of breath 3 Inhaler 3  . amLODipine (NORVASC) 10 MG tablet Take 1 tablet (10 mg total) by mouth daily. 90 tablet 3  . ascorbic acid (VITAMIN C) 500 MG tablet Take 500 mg by mouth daily.    [name] aspirin EC 81 MG tablet Take 81 mg by mouth daily.    . Blood Glucose Monitoring Suppl (ACCU-CHEK AVIVA) device Use as instructed 1 each 0  . cetirizine (ZYRTEC) 10 MG tablet Take 1 tablet (10 mg total) by mouth daily. 7 tablet 0  . Cholecalciferol (VITAMIN D3) 5000 units CAPS Take 1 capsule by mouth daily.    . clobetasol cream (TEMOVATE) 0.05 %     . clotrimazole-betamethasone (LOTRISONE) cream Apply sparingly twice daily to rash on face for 1 week, then only as needed 45 g 0  . diclofenac sodium (VOLTAREN) 1 % GEL Apply 2 g topically 4 (four) times daily. 1 Tube 3  . docusate sodium (COLACE) 100 MG capsule Take 100 mg by mouth 2 (two) times daily.    . DROPLET PEN NEEDLES 32G X 4 MM MISC USE  TO INJECT FOUR TIMES DAILY 400 each 0  . glucose blood (ACCU-CHEK AVIVA) test strip Use to check blood sugar 4 times a day 400 each 2  . hydroxychloroquine (PLAQUENIL) 200 MG tablet Take 200 mg by mouth daily.     . hydrOXYzine  (ATARAX/VISTARIL) 10 MG tablet Take 1 tablet (10 mg total) by mouth 3 (three) times daily as needed. 30 tablet 0  . insulin aspart (NOVOLOG FLEXPEN) 100 UNIT/ML FlexPen INJECT 10 TO 14 UNITS INTO THE SKIN 3 (THREE) TIMES DAILY WITH MEALS. 45 mL 3  . Insulin Glargine (TOUJEO SOLOSTAR) 300 UNIT/ML SOPN Inject 20 Units into the skin at bedtime. 9 pen 3  . lamoTRIgine (LAMICTAL) 100 MG tablet Take 1 tablet (100 mg total) by mouth 2 (two) times daily. 180 tablet 2  . levothyroxine (SYNTHROID, LEVOTHROID) 50 MCG tablet TAKE 1 TABLET  DAILY  EXCEPT TAKE 1/2 TABLET  ON  SUNDAY 85 tablet 1  . LORazepam (ATIVAN) 0.5 MG tablet Take 1 tablet (0.5 mg total) by mouth 2 (two) times daily. 180 tablet 2  . losartan (COZAAR) 50 MG tablet TAKE 1 TABLET EVERY DAY 90 tablet 1  . metaxalone (SKELAXIN) 800 MG tablet metaxalone 800 mg tablet  take 1 tablet by mouth 3 times daily as needed.    . metoprolol tartrate (LOPRESSOR) 50 MG tablet TAKE 1 TABLET TWICE DAILY (NEED MD APPOINTMENT) 180 tablet 2  . montelukast (SINGULAIR) 10 MG tablet TAKE 1 TABLET EVERY DAY 90 tablet 1  . Multiple Vitamins-Minerals (HM MULTIVITAMIN ADULT GUMMY PO) Take 1 tablet by  mouth daily.    . Nicotine (NICOTROL NS) 10 MG/ML SOLN Use 1 spray per nostril up to eight times per day as needed 10 mL 0  . nystatin (MYCOSTATIN/NYSTOP) powder APPLY TO AFFECTED AREA 4 TIMES DAILY. 30 g 2  . ondansetron (ZOFRAN) 4 MG tablet Take 1 tablet by mouth every 6 hours as needed for nausea. 50 tablet 1  . oxyCODONE-acetaminophen (PERCOCET) 10-325 MG tablet     . pregabalin (LYRICA) 75 MG capsule Take 1 capsule (75 mg total) by mouth daily.    . RABEprazole (ACIPHEX) 20 MG tablet Take 1 tablet (20 mg total) by mouth 2 (two) times daily. 180 tablet 3  . RESTASIS 0.05 % ophthalmic emulsion Place 1 drop into both eyes 2 (two) times daily.     . risperiDONE (RISPERDAL) 0.5 MG tablet Take 1 tablet (0.5 mg total) by mouth at bedtime. 90 tablet 2  . rosuvastatin  (CRESTOR) 5 MG tablet TAKE 1 TABLET AT BEDTIME 90 tablet 1  . sertraline (ZOLOFT) 100 MG tablet Take 1 tablet (100 mg total) by mouth daily. 90 tablet 2  . Tiotropium Bromide-Olodaterol (STIOLTO RESPIMAT) 2.5-2.5 MCG/ACT AERS Inhale 2 puffs into the lungs daily. 12 g 3  . TOUJEO SOLOSTAR 300 UNIT/ML SOPN INJECT 20 UNITS INTO THE SKIN AT BEDTIME. 6 pen 2  . traZODone (DESYREL) 150 MG tablet Take 0.5 tablets (75 mg total) by mouth at bedtime. 135 tablet 2  . triamcinolone ointment (KENALOG) 0.1 % Apply 1 application topically at bedtime. Apply to elbows at bedtime.    [name] umeclidinium-vilanterol (ANORO ELLIPTA) 62.5-25 MCG/INH AEPB Inhale 1 puff into the lungs daily. 1 each 5  . UNABLE TO FIND Transport Chair x 1  Dx-bilateral leg weakness, frequent falls (Patient not taking: Reported on 07/09/2018) 1 each 0  . UNABLE TO FIND Rolling walker with seat x 1  DX. Bilateral lower extremity weakness, frequent falls, instability 1 each 0  . UNABLE TO FIND Lift chair x 1  DX M47.16, R29.6 (Patient not taking: Reported on 07/09/2018) 1 each 0   No current facility-administered medications on file prior to visit.       Allergies  Allergen Reactions  . Cephalexin Hives  . Iron Nausea And Vomiting  . Milk-Related Compounds Other (See Comments)    Doesn't agree with stomach.   . Penicillins Hives       . Phenazopyridine Hcl Hives         Family History  Problem Relation Age of Onset  . Heart attack Mother        HTN  . Pneumonia Father   . Kidney failure Father   . Diabetes Father   . Pancreatic cancer Sister   . Diabetes Brother   . Hypertension Brother   . Diabetes Brother   . Cancer Sister        breast   . Hypertension Son   . Sleep apnea Son   . Cancer Sister        pancreatic  . Stroke Maternal Grandmother   . Heart attack Maternal Grandfather   . Alcohol abuse Maternal Uncle   . Colon cancer Neg Hx   . Anesthesia problems Neg Hx   . Hypotension Neg Hx   . Malignant  hyperthermia Neg Hx   . Pseudochol deficiency Neg Hx    PE: There were no vitals taken for this visit. There is no height or weight on file to calculate BMI.  Wt Readings from Last 3 Encounters:  08/16/18 177 lb (80.3 kg)  07/03/18 173 lb 1.9 oz (78.5 kg)  05/03/18 168 lb (76.2 kg)   Constitutional:  in NAD  The physical exam was not performed (telephone visit).  ASSESSMENT: 1. DM2, insulin-dependent, uncontrolled, without complications - gastroparesis - cerebrovasc. Ds -  H/o stroke - PN  She was interested in an insulin pump >> discussed about [employer] (given brochure).  2. Hypothyroidism  3. MNG - Thyroid U/S (10/11/2012):  Right thyroid lobe: 3.7 x 1.0 x 1.8 cm  Left thyroid lobe: 3.5 x 1.5 x 1.6 cm  Isthmus: 0.5 cm  Focal nodules: There is a 0.3 mm calcified nodule in the right midzone. There is a 0.6 x 0.7 x 0.5 cm hypoechoic nodule in the lateral aspect of the right midzone. There is a 0.7 x 0.6 x 0.5 cm hyperechoic nodule in the lateral aspect of the right lower pole. There is a 2.2 x 1.1 x 1.7 cm complex solid nodule in the inferior aspect of the isthmus extending adjacent to the left lower pole. There is a 1.4 x 2.1 x 1.4 cm complex solid nodule in the left lower pole.  Lymphadenopathy: None visualized.  IMPRESSION: Multi nodular goiter which has markedly progressed since the prior exam. The dominant nodule at the inferior aspect of the isthmus fits criteria for fine needle aspiration biopsy if not previously Assessed.  - FNA (11/05/2012) x2: benign  - Thyroid U/S (01/20/2014) - felt one nodule enlarging >> new U/S: stable appearance of the nodules, except a small new nodule in isthmus: 1.2 cm   Right thyroid lobe Measurements: 4.4 cm x 1.2 cm x 1.7 cm. Multiple right-sided nodules identified. Each right-sided nodule demonstrates increased echogenicity, with the superior measuring 7 mm -8 mm, most inferior measuring 9 mm - 10 mm. A small, 3 mm focus  of calcium with posterior shadowing is evident. There is also a mid nodule measuring 7 mm, which is echogenic.  Left thyroid lobe Measurements: 5.1 cm x 2.1 cm x 2.3 cm. Dominant lesion at the inferior pole of left thyroid is again evident, which has been previously biopsied (10/29/2012). This nodule measures 1.8 cm x 1.7cm x 2.5 cm. (Previous 1.4 cm x 2.1 cm x 1.4 cm)  Isthmus Thickness: 4 mm-5 mm. Isthmic nodule again noted, previously biopsied (10/29/2012). Currently this nodule measures 2.2 cm x 1.4 cm x 1.8 cm. (previous measurement 2.2 cm x 1.1 cm x 1.7 cm). There is a new nodule identified within the isthmus with heterogeneously hyperechoic characteristics. This nodule measures 12 mm x 5.4 mm x 7.2 mm.  Lymphadenopathy: None visualized.  - Thyroid U/S (11/09/2015): Details   Reading Physician Reading Date Result Priority  Sandi Mariscal, MD 11/09/2015   Narrative    COMPARISON: Thyroid ultrasound - 01/20/2014 ; 10/11/2012  FINDINGS: Parenchymal Echotexture: Moderately heterogenous Estimated total number of nodules > 1 cm: <5 Number of spongiform nodules > 2 cm not described below (TR1): 0 Number of mixed cystic nodules > 1.5 cm not described below ([place]): 0 _____________________________________________________  Isthmus: 0.7 cm  Nodule # 1: Prior biopsy: No Location: Isthmus; left Size: 1.5 x 1.3 x 1.5 cm, previously 2.2 x 1.4 x 1.8 cm Composition: solid/almost completely solid (2) Echogenicity: hyperechoic (1) ACR TI-RADS total points: 3. Change in features: Yes. Increased internal cystic degeneration and smaller in size. Change in ACR TI-RADS risk category: No  *Given size (1.5 - 2.4 cm) and appearance, a follow-up ultrasound in 1 year is recommended based on TI-RADS criteria  as clinically indicated.  _________________________________________________________  Right lobe: 4.6 x 1.1 x 1.9 cm, previously 4.4 x 1.2 x 1.7 cm Stable scattered subcentimeter echogenic  nodules _________________________________________________________  Left lobe: 4.8 x 2.1 x 1.9 cm, previously 5.1 x 2.1 x 2.3 cm Nodule # 1: Prior biopsy: No Location: Left; Inferior Size: 2.4 x 1.7 x 1.9 cm, previously 2.5 x 1.7 x 1.7 Composition: solid/almost completely solid (2) Echogenicity: hyperechoic (1) ACR TI-RADS total points: 3. ACR TI-RADS risk category: TR3 (3 points). Change in features: No Change in ACR TI-RADS risk category: No *Given size (1.5 - 2.4 cm) and appearance, a follow-up ultrasound in 1 year is recommended based on TI-RADS criteria as clinically indicated.  IMPRESSION: TR 3 isthmic and left thyroid nodules unchanged. *Given size (1.5 - 2.4 cm) and appearance of both thyroid nodules, a follow-up ultrasound in 1 year is recommended based on TI-RADS criteria as clinically indicated.  The above is in keeping with the ACR TI-RADS recommendations Natasha Mead Coll Radiol 2017;14:[id number].        10/12/2016: Thyroid U/S: Parenchymal Echotexture: Moderately heterogenous Isthmus: 1.1 cm Right lobe: 3.8 x 1.0 x 1.7 cm Left lobe: 4.5 x 1.7 x 2.1 cm ______________________________________________________  Estimated total number of nodules >/= 1 cm: 3 _____________________________________________  Diffusely heterogeneous multinodular thyroid gland. As seen previously, there are multiple small echogenic nodules scattered throughout the right gland which do not meet criteria for biopsy or follow-up. Head and lobular pyramidal lobe extends superiorly from the thyroid isthmus. No interval change.  The previously biopsied nodule in the inferior aspect of the isthmus measures slightly smaller today at 1.5 x 1.0 x 1.4 cm compared to 1.7 x 1.3 x 1.5 cm previously.  The previously biopsied nodule in the left inferior gland is essentially unchanged at 2.6 x 1.3 x 1.5 cm compared to 2.4 x 1.8 x 1.9 cm. No new nodule or abnormality identified.  IMPRESSION: 1.  Stable multinodular goiter with a prominent lobular parental lobe extending superiorly from the thyroid isthmus. 2. The previously biopsied nodule in the inferior isthmus measures slightly smaller on today's examination. 3. The previously biopsied nodule in the left inferior gland is stable. 4. Additional scattered small and benign-appearing nodules do not meet criteria for biopsy or continued follow-up. The above is in keeping with the ACR TI-RADS recommendations - J Am Coll Radiol 2017;14:587-595.   PLAN:  1. Patient with longstanding, uncontrolled type 2 diabetes, on basal-bolus insulin regimen, with slightly better sugars at this visit especially in the first half of the day after we increase Toujeo dose at last visit.  Sugars later in the day are still high, but they are variable and we have to be careful with adjusting her insulin doses to prevent low blood sugars.  She started me that she is usually using the lower end of the dose interval that I gave her for NovoLog and I advised her to start using higher doses within the same range with lunch and dinner since sugars in the evening are the highest.  We will not change the Toujeo dose for now. - I advised her to:  Patient Instructions  Please continue - Toujeo 25 units at bedtime - Novolog: 10-14 units before a meal, but use the higher doses more frequently  Please stop B complex 1 week before next visit.  Please continue levothyroxine 50 mcg 6/7 days, 25 mcg 1/7 days.  Take the thyroid hormone every day, with water, at least 30 minutes before breakfast, separated  by at least 4 hours from: - acid reflux medications - calcium - iron - multivitamins  Please return in 3 months.  - we will check her HbA1c when she returns to the clinic  - continue checking sugars at different times of the day - check 3x a day, rotating checks - advised for yearly eye exams >> she is UTD - Return to clinic in 3 mo with sugar log    2.  Hypothyroidism - latest thyroid labs reviewed with pt >> normal  - she continues on LT4 50 mcg 6/7 days and 25 mcg 1/7 days - pt feels good on this dose. - we discussed about taking the thyroid hormone every day, with water, >30 minutes before breakfast, separated by >4 hours from acid reflux medications, calcium, iron, multivitamins. Pt. is taking it correctly. - will check thyroid tests at next visit: TSH and fT4  3. MNG -She has a history of 2 normal thyroid nodule biopsies in 2014 -Stable nodules per review of the ultrasound reports from 2014-2019 -We do not need to repeat a thyroid ultrasound unless she starts developing neck compression symptoms.  He does have mild dysphagia but not due to external esophageal compression per the barium swallow study from 08/2014.  At that time, she was found to have esophageal dysmotility and had multiple esophageal dilations. -We will continue to follow this expectantly for now.  - time spent with the patient: 20 min, of which >50% was spent in obtaining information about her symptoms, reviewing her previous labs, evaluations, and treatments, counseling her about her conditions (please see the discussed topics above), and developing a plan to further investigate and treat them; she had a number of questions which I addressed.  Philemon Kingdom, MD PhD [facility] Endocrinology

## 2018-08-23 NOTE — Patient Instructions (Addendum)
Please continue - Toujeo 25 units at bedtime - Novolog: 10-14 units before a meal, but use the higher doses more frequently  Please stop B complex 1 week before next visit.  Please continue levothyroxine 50 mcg 6/7 days, 25 mcg 1/7 days.  Take the thyroid hormone every day, with water, at least 30 minutes before breakfast, separated by at least 4 hours from: - acid reflux medications - calcium - iron - multivitamins  Please return in 3 months.

## 2018-08-26 DIAGNOSIS — J449 Chronic obstructive pulmonary disease, unspecified: Secondary | ICD-10-CM | POA: Diagnosis not present

## 2018-08-26 DIAGNOSIS — E1142 Type 2 diabetes mellitus with diabetic polyneuropathy: Secondary | ICD-10-CM | POA: Diagnosis not present

## 2018-08-26 DIAGNOSIS — M6281 Muscle weakness (generalized): Secondary | ICD-10-CM | POA: Diagnosis not present

## 2018-08-26 DIAGNOSIS — M159 Polyosteoarthritis, unspecified: Secondary | ICD-10-CM | POA: Diagnosis not present

## 2018-08-26 DIAGNOSIS — I110 Hypertensive heart disease with heart failure: Secondary | ICD-10-CM | POA: Diagnosis not present

## 2018-08-26 DIAGNOSIS — M7542 Impingement syndrome of left shoulder: Secondary | ICD-10-CM | POA: Diagnosis not present

## 2018-08-26 DIAGNOSIS — Z794 Long term (current) use of insulin: Secondary | ICD-10-CM | POA: Diagnosis not present

## 2018-08-26 DIAGNOSIS — I69354 Hemiplegia and hemiparesis following cerebral infarction affecting left non-dominant side: Secondary | ICD-10-CM | POA: Diagnosis not present

## 2018-08-26 DIAGNOSIS — M5030 Other cervical disc degeneration, unspecified cervical region: Secondary | ICD-10-CM | POA: Diagnosis not present

## 2018-08-27 ENCOUNTER — Telehealth: Payer: Self-pay | Admitting: Internal Medicine

## 2018-08-27 NOTE — Telephone Encounter (Signed)
Patient is requesting that we mail her more sugar logs.  Please Advise, Thanks

## 2018-08-28 ENCOUNTER — Ambulatory Visit (INDEPENDENT_AMBULATORY_CARE_PROVIDER_SITE_OTHER): Payer: Medicare HMO | Admitting: Psychiatry

## 2018-08-28 ENCOUNTER — Other Ambulatory Visit: Payer: Self-pay

## 2018-08-28 DIAGNOSIS — M6281 Muscle weakness (generalized): Secondary | ICD-10-CM | POA: Diagnosis not present

## 2018-08-28 DIAGNOSIS — I69354 Hemiplegia and hemiparesis following cerebral infarction affecting left non-dominant side: Secondary | ICD-10-CM | POA: Diagnosis not present

## 2018-08-28 DIAGNOSIS — J449 Chronic obstructive pulmonary disease, unspecified: Secondary | ICD-10-CM | POA: Diagnosis not present

## 2018-08-28 DIAGNOSIS — M7542 Impingement syndrome of left shoulder: Secondary | ICD-10-CM | POA: Diagnosis not present

## 2018-08-28 DIAGNOSIS — F331 Major depressive disorder, recurrent, moderate: Secondary | ICD-10-CM

## 2018-08-28 DIAGNOSIS — Z794 Long term (current) use of insulin: Secondary | ICD-10-CM | POA: Diagnosis not present

## 2018-08-28 DIAGNOSIS — G4733 Obstructive sleep apnea (adult) (pediatric): Secondary | ICD-10-CM | POA: Diagnosis not present

## 2018-08-28 DIAGNOSIS — M159 Polyosteoarthritis, unspecified: Secondary | ICD-10-CM | POA: Diagnosis not present

## 2018-08-28 DIAGNOSIS — E785 Hyperlipidemia, unspecified: Secondary | ICD-10-CM | POA: Diagnosis not present

## 2018-08-28 DIAGNOSIS — M5030 Other cervical disc degeneration, unspecified cervical region: Secondary | ICD-10-CM | POA: Diagnosis not present

## 2018-08-28 DIAGNOSIS — E1142 Type 2 diabetes mellitus with diabetic polyneuropathy: Secondary | ICD-10-CM | POA: Diagnosis not present

## 2018-08-28 DIAGNOSIS — I110 Hypertensive heart disease with heart failure: Secondary | ICD-10-CM | POA: Diagnosis not present

## 2018-08-28 NOTE — Progress Notes (Signed)
Virtual Visit via Telephone Note  I connected with [name] on 08/28/18 at 11:00 AM EDT by telephone and verified that I am speaking with the correct person using two identifiers.   I discussed the limitations, risks, security and privacy concerns of performing an evaluation and management service by telephone and the availability of in person appointments. I also discussed with the patient that there may be a patient responsible charge related to this service. The patient expressed understanding and agreed to proceed.   I provided 50 minutes of non-face-to-face time during this encounter.   [name], LCSW      THERAPIST PROGRESS NOTE  Session Time: Wednesday 08/28/2018 11:10 AM - 12:00 PM      Participation Level: Active  Behavioral Response: talkative,  depressed   Type of Therapy: Individual Therapy  Treatment Goals addressed: Learn and implement behavioral strategies to overcome depression.                                                       Verbalize an understanding and resolution of current interpersonal problems.   Interventions: CBT and Supportive  Summary: [name] is a 68 y.o. female who presents with  long standing history of recurrent periods  of depression beginning when she was 87 and her favorite uncle died. Patient reports multiple psychiatric hospitlaizations due to depression and suicidal ideations with the last one occuring in 1997. Patient has participated in outpatient psychotherapy and medication management intermittently since age 55. She currently is seeing psychiatrist Dr. Harrington Challenger . Prior to this, she was seen at [facility]. Patient also has had ECT at [facility]. Symptoms have worsened in recent months due to family stress and have  included depressed mood, anxiety, excessive worry, and tearfulness.   Patient's last contact was by virtual visit via telephone  about 2 weeks ago. She reports continued stress related to medical issues. She has  decided she wants to prioritize procedures for her shoulder over procedures for her back and brain. She plans to discuss more with her providers. She expresses frustration over her health issues but also expresses determination to do all she can to help self and persevere, She states becoming down at times but deciding she is not going to entertain negative thoughts. She continues to express sadness and hurt she is not receiving as much support as she thinks she should receive from her pastor. She relies heavily on her spirituality and states wanting pastor to pray with her but says he seems distant.    Suicidal/Homicidal: Nowithout intent/plan  Therapist Response: Reviewed symptoms, discussed stressors, facilitated expression of thoughts and feelings, validated feelings, assisted patient explore her options regarding possible communication with pastor using assertiveness skills, assisted patient explore ways to nurture her spirituality including using daily devotional on audio,   Plan: Return again in 2 weeks.  Diagnosis: Axis I: MDD, Recurrent, moderate    Axis II: Deferred    [name], LCSW 08/28/2018

## 2018-08-29 DIAGNOSIS — M7542 Impingement syndrome of left shoulder: Secondary | ICD-10-CM | POA: Diagnosis not present

## 2018-08-29 DIAGNOSIS — G894 Chronic pain syndrome: Secondary | ICD-10-CM | POA: Diagnosis not present

## 2018-08-29 DIAGNOSIS — Z79891 Long term (current) use of opiate analgesic: Secondary | ICD-10-CM | POA: Diagnosis not present

## 2018-08-29 DIAGNOSIS — M542 Cervicalgia: Secondary | ICD-10-CM | POA: Diagnosis not present

## 2018-08-29 DIAGNOSIS — M5030 Other cervical disc degeneration, unspecified cervical region: Secondary | ICD-10-CM | POA: Diagnosis not present

## 2018-08-29 DIAGNOSIS — Z79899 Other long term (current) drug therapy: Secondary | ICD-10-CM | POA: Diagnosis not present

## 2018-08-30 DIAGNOSIS — M5030 Other cervical disc degeneration, unspecified cervical region: Secondary | ICD-10-CM | POA: Diagnosis not present

## 2018-08-30 DIAGNOSIS — I110 Hypertensive heart disease with heart failure: Secondary | ICD-10-CM | POA: Diagnosis not present

## 2018-08-30 DIAGNOSIS — R51 Headache: Secondary | ICD-10-CM | POA: Diagnosis not present

## 2018-08-30 DIAGNOSIS — M159 Polyosteoarthritis, unspecified: Secondary | ICD-10-CM | POA: Diagnosis not present

## 2018-08-30 DIAGNOSIS — Z794 Long term (current) use of insulin: Secondary | ICD-10-CM | POA: Diagnosis not present

## 2018-08-30 DIAGNOSIS — M6281 Muscle weakness (generalized): Secondary | ICD-10-CM | POA: Diagnosis not present

## 2018-08-30 DIAGNOSIS — D329 Benign neoplasm of meninges, unspecified: Secondary | ICD-10-CM | POA: Diagnosis not present

## 2018-08-30 DIAGNOSIS — E1142 Type 2 diabetes mellitus with diabetic polyneuropathy: Secondary | ICD-10-CM | POA: Diagnosis not present

## 2018-08-30 DIAGNOSIS — M7542 Impingement syndrome of left shoulder: Secondary | ICD-10-CM | POA: Diagnosis not present

## 2018-08-30 DIAGNOSIS — H538 Other visual disturbances: Secondary | ICD-10-CM | POA: Diagnosis not present

## 2018-08-30 DIAGNOSIS — R42 Dizziness and giddiness: Secondary | ICD-10-CM | POA: Diagnosis not present

## 2018-08-30 DIAGNOSIS — I69354 Hemiplegia and hemiparesis following cerebral infarction affecting left non-dominant side: Secondary | ICD-10-CM | POA: Diagnosis not present

## 2018-08-30 DIAGNOSIS — J449 Chronic obstructive pulmonary disease, unspecified: Secondary | ICD-10-CM | POA: Diagnosis not present

## 2018-08-31 DIAGNOSIS — I69359 Hemiplegia and hemiparesis following cerebral infarction affecting unspecified side: Secondary | ICD-10-CM | POA: Diagnosis not present

## 2018-08-31 DIAGNOSIS — E119 Type 2 diabetes mellitus without complications: Secondary | ICD-10-CM | POA: Diagnosis not present

## 2018-08-31 DIAGNOSIS — Z9181 History of falling: Secondary | ICD-10-CM | POA: Diagnosis not present

## 2018-08-31 DIAGNOSIS — M6281 Muscle weakness (generalized): Secondary | ICD-10-CM | POA: Diagnosis not present

## 2018-09-02 ENCOUNTER — Telehealth: Payer: Self-pay | Admitting: *Deleted

## 2018-09-02 ENCOUNTER — Telehealth (HOSPITAL_COMMUNITY): Payer: Self-pay | Admitting: *Deleted

## 2018-09-02 ENCOUNTER — Other Ambulatory Visit (HOSPITAL_COMMUNITY): Payer: Self-pay | Admitting: Psychiatry

## 2018-09-02 DIAGNOSIS — M6281 Muscle weakness (generalized): Secondary | ICD-10-CM | POA: Diagnosis not present

## 2018-09-02 DIAGNOSIS — I110 Hypertensive heart disease with heart failure: Secondary | ICD-10-CM | POA: Diagnosis not present

## 2018-09-02 DIAGNOSIS — M159 Polyosteoarthritis, unspecified: Secondary | ICD-10-CM | POA: Diagnosis not present

## 2018-09-02 DIAGNOSIS — M5030 Other cervical disc degeneration, unspecified cervical region: Secondary | ICD-10-CM | POA: Diagnosis not present

## 2018-09-02 DIAGNOSIS — E1142 Type 2 diabetes mellitus with diabetic polyneuropathy: Secondary | ICD-10-CM | POA: Diagnosis not present

## 2018-09-02 DIAGNOSIS — Z794 Long term (current) use of insulin: Secondary | ICD-10-CM | POA: Diagnosis not present

## 2018-09-02 DIAGNOSIS — J449 Chronic obstructive pulmonary disease, unspecified: Secondary | ICD-10-CM | POA: Diagnosis not present

## 2018-09-02 DIAGNOSIS — I69354 Hemiplegia and hemiparesis following cerebral infarction affecting left non-dominant side: Secondary | ICD-10-CM | POA: Diagnosis not present

## 2018-09-02 DIAGNOSIS — D329 Benign neoplasm of meninges, unspecified: Secondary | ICD-10-CM | POA: Diagnosis not present

## 2018-09-02 DIAGNOSIS — M7542 Impingement syndrome of left shoulder: Secondary | ICD-10-CM | POA: Diagnosis not present

## 2018-09-02 MED ORDER — LORAZEPAM 0.5 MG PO TABS: 0.5000 mg | ORAL_TABLET | Freq: Two times a day (BID) | ORAL | 2 refills | Status: DC

## 2018-09-02 NOTE — Telephone Encounter (Signed)
DR ROSS PATIENT HAS RECEIVED NOTIFICATION THAT ALL HE REFILLS WOULD REQUIRE A 10-DAY NOTIFICATION SO THAT REFILLS WOULD ARRIVE IN A TIMELY MANNER FROM HUMAN Rx . PATIENT REQUESTED REFILL TO BE SENT IN FOR ATIVAN SHE HAS A  14 DAY SUPPLY LEFT. AND HER NEXT APPOINTMENT IS 09/10/2018

## 2018-09-02 NOTE — Telephone Encounter (Signed)
sent 

## 2018-09-02 NOTE — Telephone Encounter (Signed)
Will with [facility] called wanted to let [name] know that [name] had a fall last night. She was able to get herself up and she has no injuries. If there are any questions he can be reached at [phone]

## 2018-09-02 NOTE — Telephone Encounter (Signed)
FYI

## 2018-09-02 NOTE — Telephone Encounter (Signed)
Noted, please refer her for PT twice weekly for 4 to 6 weeks, dx recent fall, unsteady gait, lower extremity weakness, PT out pt may be better if she is able , either way, whichever is possible, thanks

## 2018-09-03 ENCOUNTER — Telehealth (HOSPITAL_COMMUNITY): Payer: Self-pay

## 2018-09-03 ENCOUNTER — Telehealth: Payer: Self-pay

## 2018-09-03 DIAGNOSIS — I69354 Hemiplegia and hemiparesis following cerebral infarction affecting left non-dominant side: Secondary | ICD-10-CM | POA: Diagnosis not present

## 2018-09-03 DIAGNOSIS — I110 Hypertensive heart disease with heart failure: Secondary | ICD-10-CM | POA: Diagnosis not present

## 2018-09-03 DIAGNOSIS — Z9181 History of falling: Secondary | ICD-10-CM

## 2018-09-03 DIAGNOSIS — M6281 Muscle weakness (generalized): Secondary | ICD-10-CM | POA: Diagnosis not present

## 2018-09-03 DIAGNOSIS — J449 Chronic obstructive pulmonary disease, unspecified: Secondary | ICD-10-CM | POA: Diagnosis not present

## 2018-09-03 DIAGNOSIS — Z794 Long term (current) use of insulin: Secondary | ICD-10-CM | POA: Diagnosis not present

## 2018-09-03 DIAGNOSIS — R2681 Unsteadiness on feet: Secondary | ICD-10-CM

## 2018-09-03 DIAGNOSIS — M5030 Other cervical disc degeneration, unspecified cervical region: Secondary | ICD-10-CM | POA: Diagnosis not present

## 2018-09-03 DIAGNOSIS — M159 Polyosteoarthritis, unspecified: Secondary | ICD-10-CM | POA: Diagnosis not present

## 2018-09-03 DIAGNOSIS — R29898 Other symptoms and signs involving the musculoskeletal system: Secondary | ICD-10-CM

## 2018-09-03 DIAGNOSIS — M7542 Impingement syndrome of left shoulder: Secondary | ICD-10-CM | POA: Diagnosis not present

## 2018-09-03 DIAGNOSIS — E1142 Type 2 diabetes mellitus with diabetic polyneuropathy: Secondary | ICD-10-CM | POA: Diagnosis not present

## 2018-09-03 NOTE — Telephone Encounter (Signed)
Patient called back stating she has PT and OT coming to her home and she wanted to cx this referral. She understands she can get [name] to send another referral once homehealth is d/c

## 2018-09-03 NOTE — Telephone Encounter (Signed)
Referral to PT entered per MD

## 2018-09-03 NOTE — Telephone Encounter (Signed)
Ambulatory referral to PT entered per MD

## 2018-09-04 ENCOUNTER — Ambulatory Visit (INDEPENDENT_AMBULATORY_CARE_PROVIDER_SITE_OTHER): Payer: Medicare HMO | Admitting: Family Medicine

## 2018-09-04 ENCOUNTER — Telehealth: Payer: Self-pay | Admitting: Family Medicine

## 2018-09-04 ENCOUNTER — Encounter: Payer: Self-pay | Admitting: Family Medicine

## 2018-09-04 ENCOUNTER — Ambulatory Visit: Payer: Medicare HMO | Admitting: Podiatry

## 2018-09-04 ENCOUNTER — Other Ambulatory Visit: Payer: Self-pay

## 2018-09-04 VITALS — BP 150/70 | Temp 98.3°F | Ht 59.0 in | Wt 176.0 lb

## 2018-09-04 DIAGNOSIS — M7542 Impingement syndrome of left shoulder: Secondary | ICD-10-CM | POA: Diagnosis not present

## 2018-09-04 DIAGNOSIS — R140 Abdominal distension (gaseous): Secondary | ICD-10-CM | POA: Diagnosis not present

## 2018-09-04 DIAGNOSIS — M5030 Other cervical disc degeneration, unspecified cervical region: Secondary | ICD-10-CM | POA: Diagnosis not present

## 2018-09-04 DIAGNOSIS — Z794 Long term (current) use of insulin: Secondary | ICD-10-CM | POA: Diagnosis not present

## 2018-09-04 DIAGNOSIS — M159 Polyosteoarthritis, unspecified: Secondary | ICD-10-CM | POA: Diagnosis not present

## 2018-09-04 DIAGNOSIS — I110 Hypertensive heart disease with heart failure: Secondary | ICD-10-CM | POA: Diagnosis not present

## 2018-09-04 DIAGNOSIS — R197 Diarrhea, unspecified: Secondary | ICD-10-CM

## 2018-09-04 DIAGNOSIS — M6281 Muscle weakness (generalized): Secondary | ICD-10-CM | POA: Diagnosis not present

## 2018-09-04 DIAGNOSIS — I69354 Hemiplegia and hemiparesis following cerebral infarction affecting left non-dominant side: Secondary | ICD-10-CM | POA: Diagnosis not present

## 2018-09-04 DIAGNOSIS — R109 Unspecified abdominal pain: Secondary | ICD-10-CM

## 2018-09-04 DIAGNOSIS — J449 Chronic obstructive pulmonary disease, unspecified: Secondary | ICD-10-CM | POA: Diagnosis not present

## 2018-09-04 DIAGNOSIS — E1142 Type 2 diabetes mellitus with diabetic polyneuropathy: Secondary | ICD-10-CM | POA: Diagnosis not present

## 2018-09-04 NOTE — Telephone Encounter (Signed)
FYI

## 2018-09-04 NOTE — Patient Instructions (Addendum)
Thank you for completing your visit via telephone. I appreciate the opportunity to provide you with the care for your health and wellness. Today we discussed:   FOLLOW UP: as you need  NEXT APPT: 12/24/2018  No labs today.  Imodium as directed on box.  Gas-X as directed on box.  Sugar Gatorade/Water  BAT DIET: Bananas Applesauce Dry Toast  AVOID DELI MEAT AND DAIRY for a few days to week until stomach is settled  Please continue to practice social distancing.  Wear a mask when you go out. And wash your hands well.  [place] YOUR HANDS WELL AND FREQUENTLY. AVOID TOUCHING YOUR FACE, UNLESS YOUR HANDS ARE FRESHLY WASHED.  GET FRESH AIR DAILY. STAY HYDRATED WITH WATER.   It was a pleasure to see you and I look forward to continuing to work together on your health and well-being. Please do not hesitate to call the office if you need care or have questions about your care.  Have a wonderful day and week.  With Gratitude,  Cherly Beach, DNP, AGNP-BC   Criss Rosales Diet A bland diet consists of foods that are often soft and do not have a lot of fat, fiber, or extra seasonings. Foods without fat, fiber, or seasoning are easier for the body to digest. They are also less likely to irritate your mouth, throat, stomach, and other parts of your digestive system. A bland diet is sometimes called a BRAT diet. What is my plan? Your health care provider or food and nutrition specialist (dietitian) may recommend specific changes to your diet to prevent symptoms or to treat your symptoms. These changes may include:  Eating small meals often.  Cooking food until it is soft enough to chew easily.  Chewing your food well.  Drinking fluids slowly.  Not eating foods that are very spicy, sour, or fatty.  Not eating citrus fruits, such as oranges and grapefruit. What do I need to know about this diet?  Eat a variety of foods from the bland diet food list.  Do not follow a bland diet  longer than needed.  Ask your health care provider whether you should take vitamins or supplements. What foods can I eat? Grains  Hot cereals, such as cream of wheat. Rice. Bread, crackers, or tortillas made from refined white flour. Vegetables Canned or cooked vegetables. Mashed or boiled potatoes. Fruits  Bananas. Applesauce. Other types of cooked or canned fruit with the skin and seeds removed, such as canned peaches or pears. Meats and other proteins  Scrambled eggs. Creamy peanut butter or other nut butters. Lean, well-cooked meats, such as chicken or fish. Tofu. Soups or broths. Dairy Low-fat dairy products, such as milk, cottage cheese, or yogurt. Beverages  Water. Herbal tea. Apple juice. Fats and oils Mild salad dressings. Canola or olive oil. Sweets and desserts Pudding. Custard. Fruit gelatin. Ice cream. The items listed above may not be a complete list of recommended foods and beverages. Contact a dietitian for more options. What foods are not recommended? Grains Whole grain breads and cereals. Vegetables Raw vegetables. Fruits Raw fruits, especially citrus, berries, or dried fruits. Dairy Whole fat dairy foods. Beverages Caffeinated drinks. Alcohol. Seasonings and condiments Strongly flavored seasonings or condiments. Hot sauce. Salsa. Other foods Spicy foods. Fried foods. Sour foods, such as pickled or fermented foods. Foods with high sugar content. Foods high in fiber. The items listed above may not be a complete list of foods and beverages to avoid. Contact a dietitian for more  information. Summary  A bland diet consists of foods that are often soft and do not have a lot of fat, fiber, or extra seasonings.  Foods without fat, fiber, or seasoning are easier for the body to digest.  Check with your health care provider to see how long you should follow this diet plan. It is not meant to be followed for long periods. This information is not intended to  replace advice given to you by your health care provider. Make sure you discuss any questions you have with your health care provider. Document Released: 06/21/2015 Document Revised: 03/28/2017 Document Reviewed: 03/28/2017 Elsevier Interactive Patient Education  2019 [employer]   Diarrhea, Adult Diarrhea is when you pass loose and watery poop (stool) often. Diarrhea can make you feel weak and cause you to lose water in your body (get dehydrated). Losing water in your body can cause you to:  Feel tired and thirsty.  Have a dry mouth.  Go pee (urinate) less often. Diarrhea often lasts 2-3 days. However, it can last longer if it is a sign of something more serious. It is important to treat your diarrhea as told by your doctor. Follow these instructions at home: Eating and drinking     Follow these instructions as told by your doctor:  Take an ORS (oral rehydration solution). This is a drink that helps you replace fluids and minerals your body lost. It is sold at pharmacies and stores.  Drink plenty of fluids, such as: ? Water. ? Ice chips. ? Diluted fruit juice. ? Low-calorie sports drinks. ? Milk, if you want.  Avoid drinking fluids that have a lot of sugar or caffeine in them.  Eat bland, easy-to-digest foods in small amounts as you are able. These foods include: ? Bananas. ? Applesauce. ? Rice. ? Low-fat (lean) meats. ? Toast. ? Crackers.  Avoid alcohol.  Avoid spicy or fatty foods.  Medicines  Take over-the-counter and prescription medicines only as told by your doctor.  If you were prescribed an antibiotic medicine, take it as told by your doctor. Do not stop using the antibiotic even if you start to feel better. General instructions   Wash your hands often using soap and water. If soap and water are not available, use a hand sanitizer. Others in your home should wash their hands as well. Hands should be washed: ? After using the toilet or changing a  diaper. ? Before preparing, cooking, or serving food. ? While caring for a sick person. ? While visiting someone in a hospital.  Drink enough fluid to keep your pee (urine) pale yellow.  Rest at home while you get better.  Watch your condition for any changes.  Take a warm bath to help with any burning or pain from having diarrhea.  Keep all follow-up visits as told by your doctor. This is important. Contact a doctor if:  You have a fever.  Your diarrhea gets worse.  You have new symptoms.  You cannot keep fluids down.  You feel light-headed or dizzy.  You have a headache.  You have muscle cramps. Get help right away if:  You have chest pain.  You feel very weak or you pass out (faint).  You have bloody or black poop or poop that looks like tar.  You have very bad pain, cramping, or bloating in your belly (abdomen).  You have trouble breathing or you are breathing very quickly.  Your heart is beating very quickly.  Your skin feels cold  and clammy.  You feel confused.  You have signs of losing too much water in your body, such as: ? Dark pee, very little pee, or no pee. ? Cracked lips. ? Dry mouth. ? Sunken eyes. ? Sleepiness. ? Weakness. Summary  Diarrhea is when you pass loose and watery poop (stool) often.  Diarrhea can make you feel weak and cause you to lose water in your body (get dehydrated).  Take an ORS (oral rehydration solution). This is a drink that is sold at pharmacies and stores.  Eat bland, easy-to-digest foods in small amounts as you are able.  Contact a doctor if your condition gets worse. Get help right away if you have signs that you have lost too much water in your body. This information is not intended to replace advice given to you by your health care provider. Make sure you discuss any questions you have with your health care provider. Document Released: 08/16/2007 Document Revised: 08/03/2017 Document Reviewed: 08/03/2017  Elsevier Interactive Patient Education  2019 [employer].

## 2018-09-04 NOTE — Telephone Encounter (Signed)
noted 

## 2018-09-04 NOTE — Progress Notes (Signed)
Virtual Visit via Telephone Note   This visit type was conducted due to national recommendations for restrictions regarding the COVID-19 Pandemic (e.g. social distancing) in an effort to limit this patient's exposure and mitigate transmission in our community.  Due to her co-morbid illnesses, this patient is at least at moderate risk for complications without adequate follow up.  This format is felt to be most appropriate for this patient at this time.  The patient did not have access to video technology/had technical difficulties with video requiring transitioning to audio format only (telephone).  All issues noted in this document were discussed and addressed.  No physical exam could be performed with this format.    Evaluation Performed:  Follow-up visit  Date:  09/04/2018   ID:  [name], DOB 10-14-1950, MRN [id number]  Patient Location: Home Provider Location: Other:  telemedicine  Location of Patient: Home Location of Provider: Telehealth Consent was obtain for visit to be over via telehealth. I verified that I am speaking with the correct person using two identifiers.  PCP:  [name], MD   Chief Complaint:  Diarrhea, gas, and stomach pains  History of Present Illness:    [name] is a 68 y.o. female with onset of diarrhea, abdominal pain, cramping, gas.  Reports that she ate a bologna sandwich last night.  And then this all started.  Denies having fevers, chills.  Denies having a rigid abdominal area.  Denies having any nausea or vomiting.  Denies having any blood in her stools.  Is diabetic does report some elevation in her blood sugar.  But reports taking all her medications as directed and without issue.  She denies trying anything to help with relieving of symptoms.  The patient does not have symptoms concerning for COVID-19 infection (fever, chills, cough, or new shortness of breath).   Past Medical, Surgical, Social History, Allergies, and Medications  have been Reviewed.  Past Medical History:  Diagnosis Date  . Allergy   . Anemia   . Anxiety    takes Ativan daily  . Arthritis   . Assistance needed for mobility   . Bipolar disorder ([place])    takes Risperdal nightly  . Blood transfusion   . Cancer ([place])    In her gum  . Carpal tunnel syndrome of right wrist 05/23/2011  . Cervical disc disorder with radiculopathy of cervical region 10/31/2012  . Chronic back pain   . Chronic idiopathic constipation   . Chronic neck and back pain   . Colon polyps   . COPD (chronic obstructive pulmonary disease) with chronic bronchitis ([place]) 09/16/2013   Office Spirometry 10/30/2013-submaximal effort based on appearance of loop and curve. Numbers would fit with severe restriction but her physiologic capability may be better than this. FVC 0.91/44%, and 10.74/45%, FEV1/FVC 0.81, FEF 25-75% 1.43/69%    . Depression    takes Zoloft daily  . Diabetes mellitus    Type II  . Diverticulosis    TCS 9/08 by Dr. Delfin Edis for diarrhea . Bx for micro scopic colitis negative.   . Fibromyalgia   . Frequent falls   . GERD (gastroesophageal reflux disease)    takes Aciphex daily  . Glaucoma    eye drops daily  . Gum symptoms    infection on antibiotic  . Hemiplegia affecting non-dominant side, post-stroke 08/02/2011  . Hyperlipidemia    takes Crestor daily  . Hypertension    takes Amlodipine,Metoprolol,and Clonidine daily  .  Hypothyroidism    takes Synthroid daily  . IBS (irritable bowel syndrome)   . Insomnia    takes Trazodone nightly  . 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 Metabolic encephalopathy 06/01/252  . Migraines    chronic headaches  . Mononeuritis lower limb   . Narcolepsy   . Osteoporosis   . Pancreatitis 2006   due to Depakote with normal EUS   . Schatzki's ring    non critical / EGD with ED 8/2011with RMR  . Seizures ([place])    takes Lamictal daily.Last seizure 3 yrs ago  . Sleep apnea    on CPAP  . Stroke [facility])    left sided  weakness, speech changes  . Tubular adenoma of colon    Past Surgical History:  Procedure Laterality Date  . ABDOMINAL HYSTERECTOMY  1978  . BACK SURGERY  July 2012  . BACTERIAL OVERGROWTH TEST N/A 05/05/2013   Procedure: BACTERIAL OVERGROWTH TEST;  Surgeon: Daneil Dolin, MD;  [facility];  Service: Endoscopy;  Laterality: N/A;  7:30  . BIOPSY THYROID  2009  . BRAIN SURGERY  11/2011   resection of meningioma  . BREAST REDUCTION SURGERY  1994  . CARDIAC CATHETERIZATION  05/10/2005   normal coronaries, normal LV systolic function and EF (Dr. Jackie Plum)  . CARPAL TUNNEL RELEASE Left 07/22/04   Dr. Aline Brochure  . CATARACT EXTRACTION Bilateral   . CHOLECYSTECTOMY  1984  . COLONOSCOPY N/A 09/25/2012   [facility]:WCBJSEG diverticulosis.  colonic polyp-removed : tubular adenoma  . CRANIOTOMY  11/23/2011   Procedure: CRANIOTOMY TUMOR EXCISION;  Surgeon: Hosie Spangle, MD;  Location: [place] [facility];  Service: Neurosurgery;  Laterality: N/A;  Craniotomy for tumor resection  . ESOPHAGOGASTRODUODENOSCOPY  12/29/2010   Rourk-Retained food in the esophagus and stomach, small hiatal hernia, status post Maloney dilation of the esophagus  . ESOPHAGOGASTRODUODENOSCOPY N/A 09/25/2012   BTD:VVOHYWVP atonic baggy esophagus status post Maloney dilation 28 F. Hiatal hernia  . GIVENS CAPSULE STUDY N/A 01/15/2013   NORMAL.   . IR GENERIC HISTORICAL  03/17/2016   IR RADIOLOGIST EVAL & MGMT 03/17/2016 [facility]  . LESION REMOVAL N/A 05/31/2015   Procedure: REMOVAL RIGHT AND LEFT LESIONS OF MANDIBLE;  Surgeon: Diona Browner, DDS;  Location: [place];  Service: Oral Surgery;  Laterality: N/A;  . MALONEY DILATION  12/29/2010   RMR;  . NM MYOCAR PERF WALL MOTION  2006   "relavtiely normal" persantine, mild anterior thinning (breast attenuation artifact), no region of scar/ischemia  . OVARIAN CYST REMOVAL    . RECTOCELE REPAIR N/A 06/29/2015   Procedure: POSTERIOR REPAIR (RECTOCELE);  Surgeon: Jonnie Kind, MD;   Location: [facility];  Service: Gynecology;  Laterality: N/A;  . REDUCTION MAMMAPLASTY Bilateral   . SPINE SURGERY  09/29/2010   Dr. Rolena Infante  . surgical excision of 3 tumors from right thigh and right buttock  and left upper thigh  2010  . TOOTH EXTRACTION Bilateral 12/14/2014   Procedure: REMOVAL OF BILATERAL MANDIBULAR EXOSTOSES;  Surgeon: Diona Browner, DDS;  Location: [place];  Service: Oral Surgery;  Laterality: Bilateral;  . TRANSTHORACIC ECHOCARDIOGRAM  2010   EF 60-65%, mild conc LVH, grade 1 diastolic dysfunction; mildly calcified MV annulus with mildly thickened leaflets, mildly calcified MR annulus     Current Meds  Medication Sig  . ACCU-CHEK FASTCLIX LANCETS MISC Use to check blood sugar 4 times a day  . albuterol (VENTOLIN HFA) 108 (90 Base) MCG/ACT inhaler Inhale 1-2 puffs every 6 hours  as needed for wheezing, shortness of breath  . amLODipine (NORVASC) 10 MG tablet Take 1 tablet (10 mg total) by mouth daily.  [name] ascorbic acid (VITAMIN C) 500 MG tablet Take 500 mg by mouth daily.  [name] aspirin EC 81 MG tablet Take 81 mg by mouth daily.  . Blood Glucose Monitoring Suppl (ACCU-CHEK AVIVA) device Use as instructed  . cetirizine (ZYRTEC) 10 MG tablet Take 1 tablet (10 mg total) by mouth daily.  . Cholecalciferol (VITAMIN D3) 5000 units CAPS Take 1 capsule by mouth daily.  . clobetasol cream (TEMOVATE) 0.05 %   . clotrimazole-betamethasone (LOTRISONE) cream Apply sparingly twice daily to rash on face for 1 week, then only as needed  . diclofenac sodium (VOLTAREN) 1 % GEL Apply 2 g topically 4 (four) times daily.  [name] docusate sodium (COLACE) 100 MG capsule Take 100 mg by mouth 2 (two) times daily.  . DROPLET PEN NEEDLES 32G X 4 MM MISC USE  TO INJECT FOUR TIMES DAILY  . glucose blood (ACCU-CHEK AVIVA) test strip Use to check blood sugar 4 times a day  . hydroxychloroquine (PLAQUENIL) 200 MG tablet Take 200 mg by mouth daily.   . hydrOXYzine (ATARAX/VISTARIL) 10 MG tablet Take 1 tablet (10 mg  total) by mouth 3 (three) times daily as needed.  . insulin aspart (NOVOLOG FLEXPEN) 100 UNIT/ML FlexPen INJECT 10 TO 14 UNITS INTO THE SKIN 3 (THREE) TIMES DAILY WITH MEALS.  [name] Insulin Glargine (TOUJEO SOLOSTAR) 300 UNIT/ML SOPN Inject 20 Units into the skin at bedtime.  . lamoTRIgine (LAMICTAL) 100 MG tablet Take 1 tablet (100 mg total) by mouth 2 (two) times daily.  [name] levothyroxine (SYNTHROID, LEVOTHROID) 50 MCG tablet TAKE 1 TABLET  DAILY  EXCEPT TAKE 1/2 TABLET  ON  SUNDAY  . LORazepam (ATIVAN) 0.5 MG tablet Take 1 tablet (0.5 mg total) by mouth 2 (two) times daily.  [name] losartan (COZAAR) 50 MG tablet TAKE 1 TABLET EVERY DAY  . metaxalone (SKELAXIN) 800 MG tablet metaxalone 800 mg tablet  take 1 tablet by mouth 3 times daily as needed.  . metoprolol tartrate (LOPRESSOR) 50 MG tablet TAKE 1 TABLET TWICE DAILY (NEED MD APPOINTMENT)  . montelukast (SINGULAIR) 10 MG tablet TAKE 1 TABLET EVERY DAY  . Multiple Vitamins-Minerals (HM MULTIVITAMIN ADULT GUMMY PO) Take 1 tablet by mouth daily.  . Nicotine (NICOTROL NS) 10 MG/ML SOLN Use 1 spray per nostril up to eight times per day as needed  . nystatin (MYCOSTATIN/NYSTOP) powder APPLY TO AFFECTED AREA 4 TIMES DAILY.  [name] ondansetron (ZOFRAN) 4 MG tablet Take 1 tablet by mouth every 6 hours as needed for nausea.  [name] oxyCODONE-acetaminophen (PERCOCET) 10-325 MG tablet   . pregabalin (LYRICA) 75 MG capsule Take 1 capsule (75 mg total) by mouth daily.  . RABEprazole (ACIPHEX) 20 MG tablet Take 1 tablet (20 mg total) by mouth 2 (two) times daily.  . RESTASIS 0.05 % ophthalmic emulsion Place 1 drop into both eyes 2 (two) times daily.   . risperiDONE (RISPERDAL) 0.5 MG tablet Take 1 tablet (0.5 mg total) by mouth at bedtime.  . rosuvastatin (CRESTOR) 5 MG tablet TAKE 1 TABLET AT BEDTIME  . sertraline (ZOLOFT) 100 MG tablet Take 1 tablet (100 mg total) by mouth daily.  . Tiotropium Bromide-Olodaterol (STIOLTO RESPIMAT) 2.5-2.5 MCG/ACT AERS Inhale 2 puffs into  the lungs daily.  . TOUJEO SOLOSTAR 300 UNIT/ML SOPN INJECT 20 UNITS INTO THE SKIN AT BEDTIME.  . traZODone (DESYREL) 150 MG tablet Take 0.5  tablets (75 mg total) by mouth at bedtime.  . triamcinolone ointment (KENALOG) 0.1 % Apply 1 application topically at bedtime. Apply to elbows at bedtime.  [name] umeclidinium-vilanterol (ANORO ELLIPTA) 62.5-25 MCG/INH AEPB Inhale 1 puff into the lungs daily.  [name] UNABLE TO FIND Transport Chair x 1  Dx-bilateral leg weakness, frequent falls  . UNABLE TO FIND Rolling walker with seat x 1  DX. Bilateral lower extremity weakness, frequent falls, instability  . UNABLE TO FIND Lift chair x 1  DX M47.16, R29.6     Allergies:   Cephalexin, Iron, Milk-related compounds, Penicillins, and Phenazopyridine hcl   Social History   Tobacco Use  . Smoking status: Current Every Day Smoker    Packs/day: 0.50    Years: 30.00    Pack years: 15.00    Types: Cigarettes  . Smokeless tobacco: Never Used  . Tobacco comment: 5-10 cigs a day  Substance Use Topics  . Alcohol use: No    Alcohol/week: 0.0 standard drinks    Comment:    . Drug use: No     Family Hx: The patient's family history includes Alcohol abuse in her maternal uncle; Cancer in her sister and sister; Diabetes in her brother, brother, and father; Heart attack in her maternal grandfather and mother; Hypertension in her brother and son; Kidney failure in her father; Pancreatic cancer in her sister; Pneumonia in her father; Sleep apnea in her son; Stroke in her maternal grandmother. There is no history of Colon cancer, Anesthesia problems, Hypotension, Malignant hyperthermia, or Pseudochol deficiency.  ROS:   Please see the history of present illness.    Review of Systems  Constitutional: Negative for activity change, appetite change, chills and fever.  HENT: Negative.   Eyes: Negative.   Respiratory: Negative.   Cardiovascular: Negative.   Gastrointestinal: Positive for abdominal distention, abdominal  pain and diarrhea. Negative for blood in stool, nausea and vomiting.  Endocrine: Negative.   Genitourinary: Negative.   Musculoskeletal: Negative.   Skin: Negative.   Allergic/Immunologic: Negative.   Neurological: Negative.   Hematological: Negative.   Psychiatric/Behavioral: Negative.   All other systems reviewed and are negative.   All other systems reviewed and are negative.   Labs/Other Tests and Data Reviewed:    Recent Labs: 10/08/2017: TSH 0.52 08/09/2018: ALT 25; BUN 23; Creatinine, Ser 1.03; Hemoglobin 10.4; Platelets 230; Potassium 4.0; Sodium 143   Recent Lipid Panel Lab Results  Component Value Date/Time   CHOL 143 02/11/2018 01:10 PM   TRIG 89 02/11/2018 01:10 PM   HDL 53 02/11/2018 01:10 PM   CHOLHDL 3.1 07/19/2015 09:25 AM   LDLCALC 72 02/11/2018 01:10 PM    Wt Readings from Last 3 Encounters:  09/04/18 176 lb (79.8 kg)  08/16/18 177 lb (80.3 kg)  07/03/18 173 lb 1.9 oz (78.5 kg)     Objective:    Vital Signs:  BP (!) 150/70   Temp 98.3 F (36.8 C)   Ht 4\' 11"  (1.499 m)   Wt 176 lb (79.8 kg)   BMI 35.55 kg/m    VITAL SIGNS:  reviewed GEN:  alert and oriented RESPIRATORY:  no noted shortness of breath in conversation PSYCH:  normal mood, affect and good communication  ASSESSMENT & PLAN:    1. Diarrhea in adult patient Signs and symptoms of diarrhea, probably related to eating deli meat/bologna sandwich.  Does not have fevers does not have chills.  Educated about the self-limiting nature of diarrhea.  Advised to keep hydrated.  Can  use Imodium over-the-counter to help with symptom management.  Provided with bland diet and diarrhea information.  2. Abdominal bloating with cramps Advised to get Gas-X/simethicone to help with bloating and extra gas buildup.    Time:   Today, I have spent 10 minutes with the patient with telehealth technology discussing the above problems.     Medication Adjustments/Labs and Tests Ordered: Current medicines  are reviewed at length with the patient today.  Concerns regarding medicines are outlined above.   Tests Ordered: No orders of the defined types were placed in this encounter.   Medication Changes: No orders of the defined types were placed in this encounter.   Disposition:  Follow up as needed  Signed, Perlie Mayo, NP  09/04/2018 2:40 PM     [place]

## 2018-09-04 NOTE — Telephone Encounter (Signed)
Pt called and said she is doing therapy for her arm and leg via [facility], she does not want outpatient therapy

## 2018-09-05 DIAGNOSIS — E1142 Type 2 diabetes mellitus with diabetic polyneuropathy: Secondary | ICD-10-CM | POA: Diagnosis not present

## 2018-09-05 DIAGNOSIS — J449 Chronic obstructive pulmonary disease, unspecified: Secondary | ICD-10-CM | POA: Diagnosis not present

## 2018-09-05 DIAGNOSIS — M5030 Other cervical disc degeneration, unspecified cervical region: Secondary | ICD-10-CM | POA: Diagnosis not present

## 2018-09-05 DIAGNOSIS — M6281 Muscle weakness (generalized): Secondary | ICD-10-CM | POA: Diagnosis not present

## 2018-09-05 DIAGNOSIS — I110 Hypertensive heart disease with heart failure: Secondary | ICD-10-CM | POA: Diagnosis not present

## 2018-09-05 DIAGNOSIS — M159 Polyosteoarthritis, unspecified: Secondary | ICD-10-CM | POA: Diagnosis not present

## 2018-09-05 DIAGNOSIS — I69354 Hemiplegia and hemiparesis following cerebral infarction affecting left non-dominant side: Secondary | ICD-10-CM | POA: Diagnosis not present

## 2018-09-05 DIAGNOSIS — Z794 Long term (current) use of insulin: Secondary | ICD-10-CM | POA: Diagnosis not present

## 2018-09-05 DIAGNOSIS — M7542 Impingement syndrome of left shoulder: Secondary | ICD-10-CM | POA: Diagnosis not present

## 2018-09-09 DIAGNOSIS — M6281 Muscle weakness (generalized): Secondary | ICD-10-CM | POA: Diagnosis not present

## 2018-09-09 DIAGNOSIS — I110 Hypertensive heart disease with heart failure: Secondary | ICD-10-CM | POA: Diagnosis not present

## 2018-09-09 DIAGNOSIS — J449 Chronic obstructive pulmonary disease, unspecified: Secondary | ICD-10-CM | POA: Diagnosis not present

## 2018-09-09 DIAGNOSIS — M159 Polyosteoarthritis, unspecified: Secondary | ICD-10-CM | POA: Diagnosis not present

## 2018-09-09 DIAGNOSIS — M5030 Other cervical disc degeneration, unspecified cervical region: Secondary | ICD-10-CM | POA: Diagnosis not present

## 2018-09-09 DIAGNOSIS — Z794 Long term (current) use of insulin: Secondary | ICD-10-CM | POA: Diagnosis not present

## 2018-09-09 DIAGNOSIS — M7542 Impingement syndrome of left shoulder: Secondary | ICD-10-CM | POA: Diagnosis not present

## 2018-09-09 DIAGNOSIS — I69354 Hemiplegia and hemiparesis following cerebral infarction affecting left non-dominant side: Secondary | ICD-10-CM | POA: Diagnosis not present

## 2018-09-09 DIAGNOSIS — E1142 Type 2 diabetes mellitus with diabetic polyneuropathy: Secondary | ICD-10-CM | POA: Diagnosis not present

## 2018-09-10 ENCOUNTER — Ambulatory Visit (INDEPENDENT_AMBULATORY_CARE_PROVIDER_SITE_OTHER): Payer: Medicare HMO | Admitting: Psychiatry

## 2018-09-10 ENCOUNTER — Other Ambulatory Visit: Payer: Self-pay

## 2018-09-10 ENCOUNTER — Encounter (HOSPITAL_COMMUNITY): Payer: Self-pay | Admitting: Psychiatry

## 2018-09-10 DIAGNOSIS — M6281 Muscle weakness (generalized): Secondary | ICD-10-CM | POA: Diagnosis not present

## 2018-09-10 DIAGNOSIS — R32 Unspecified urinary incontinence: Secondary | ICD-10-CM | POA: Diagnosis not present

## 2018-09-10 DIAGNOSIS — I69354 Hemiplegia and hemiparesis following cerebral infarction affecting left non-dominant side: Secondary | ICD-10-CM | POA: Diagnosis not present

## 2018-09-10 DIAGNOSIS — F331 Major depressive disorder, recurrent, moderate: Secondary | ICD-10-CM | POA: Diagnosis not present

## 2018-09-10 DIAGNOSIS — M150 Primary generalized (osteo)arthritis: Secondary | ICD-10-CM | POA: Diagnosis not present

## 2018-09-10 DIAGNOSIS — E1142 Type 2 diabetes mellitus with diabetic polyneuropathy: Secondary | ICD-10-CM | POA: Diagnosis not present

## 2018-09-10 DIAGNOSIS — I639 Cerebral infarction, unspecified: Secondary | ICD-10-CM | POA: Diagnosis not present

## 2018-09-10 DIAGNOSIS — M5030 Other cervical disc degeneration, unspecified cervical region: Secondary | ICD-10-CM | POA: Diagnosis not present

## 2018-09-10 DIAGNOSIS — I110 Hypertensive heart disease with heart failure: Secondary | ICD-10-CM | POA: Diagnosis not present

## 2018-09-10 DIAGNOSIS — M159 Polyosteoarthritis, unspecified: Secondary | ICD-10-CM | POA: Diagnosis not present

## 2018-09-10 DIAGNOSIS — M7542 Impingement syndrome of left shoulder: Secondary | ICD-10-CM | POA: Diagnosis not present

## 2018-09-10 DIAGNOSIS — Z794 Long term (current) use of insulin: Secondary | ICD-10-CM | POA: Diagnosis not present

## 2018-09-10 DIAGNOSIS — J449 Chronic obstructive pulmonary disease, unspecified: Secondary | ICD-10-CM | POA: Diagnosis not present

## 2018-09-10 MED ORDER — LORAZEPAM 0.5 MG PO TABS: 0.5000 mg | ORAL_TABLET | Freq: Two times a day (BID) | ORAL | 2 refills | Status: DC

## 2018-09-10 MED ORDER — RISPERIDONE 0.5 MG PO TABS: 0.5000 mg | ORAL_TABLET | Freq: Every day | ORAL | 2 refills | Status: DC

## 2018-09-10 MED ORDER — TRAZODONE HCL 100 MG PO TABS: 100.0000 mg | ORAL_TABLET | Freq: Every day | ORAL | 2 refills | Status: DC

## 2018-09-10 MED ORDER — SERTRALINE HCL 100 MG PO TABS: 100.0000 mg | ORAL_TABLET | Freq: Every day | ORAL | 2 refills | Status: DC
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
## 2018-09-11 ENCOUNTER — Telehealth (HOSPITAL_COMMUNITY): Payer: Self-pay

## 2018-09-11 DIAGNOSIS — M5030 Other cervical disc degeneration, unspecified cervical region: Secondary | ICD-10-CM | POA: Diagnosis not present

## 2018-09-11 DIAGNOSIS — M6281 Muscle weakness (generalized): Secondary | ICD-10-CM | POA: Diagnosis not present

## 2018-09-11 DIAGNOSIS — Z794 Long term (current) use of insulin: Secondary | ICD-10-CM | POA: Diagnosis not present

## 2018-09-11 DIAGNOSIS — E1142 Type 2 diabetes mellitus with diabetic polyneuropathy: Secondary | ICD-10-CM | POA: Diagnosis not present

## 2018-09-11 DIAGNOSIS — J449 Chronic obstructive pulmonary disease, unspecified: Secondary | ICD-10-CM | POA: Diagnosis not present

## 2018-09-11 DIAGNOSIS — I10 Essential (primary) hypertension: Secondary | ICD-10-CM | POA: Diagnosis not present

## 2018-09-11 DIAGNOSIS — M7542 Impingement syndrome of left shoulder: Secondary | ICD-10-CM | POA: Diagnosis not present

## 2018-09-11 DIAGNOSIS — I110 Hypertensive heart disease with heart failure: Secondary | ICD-10-CM | POA: Diagnosis not present

## 2018-09-11 DIAGNOSIS — I69354 Hemiplegia and hemiparesis following cerebral infarction affecting left non-dominant side: Secondary | ICD-10-CM | POA: Diagnosis not present

## 2018-09-11 DIAGNOSIS — M159 Polyosteoarthritis, unspecified: Secondary | ICD-10-CM | POA: Diagnosis not present

## 2018-09-11 NOTE — Telephone Encounter (Signed)
Spoke to [facility] regarding patient's Ativan. They stated that the medication was rejected and went to failed claims. It is in the process of being reviewed by the pharmacist. They stated that there is nothing needed at this time and they will reach out to [name] if any further information is needed

## 2018-09-12 ENCOUNTER — Telehealth: Payer: Self-pay | Admitting: *Deleted

## 2018-09-12 ENCOUNTER — Other Ambulatory Visit: Payer: Self-pay | Admitting: Family Medicine

## 2018-09-12 DIAGNOSIS — D329 Benign neoplasm of meninges, unspecified: Secondary | ICD-10-CM | POA: Diagnosis not present

## 2018-09-12 DIAGNOSIS — H538 Other visual disturbances: Secondary | ICD-10-CM | POA: Diagnosis not present

## 2018-09-12 DIAGNOSIS — D320 Benign neoplasm of cerebral meninges: Secondary | ICD-10-CM | POA: Diagnosis not present

## 2018-09-12 MED ORDER — OLOPATADINE HCL 0.1 % OP SOLN: 1.0000 [drp] | Freq: Two times a day (BID) | OPHTHALMIC | 12 refills | Status: DC

## 2018-09-12 NOTE — Progress Notes (Signed)
patanol

## 2018-09-12 NOTE — Telephone Encounter (Signed)
Pt called said she is trying to get an allergy in her eye or pink eye and needs [name] to call her in some eye drops. She can be reached [phone]

## 2018-09-12 NOTE — Telephone Encounter (Signed)
pls let her know patanol is prescribed

## 2018-09-16 ENCOUNTER — Other Ambulatory Visit: Payer: Self-pay

## 2018-09-16 ENCOUNTER — Other Ambulatory Visit: Payer: Self-pay | Admitting: Family Medicine

## 2018-09-16 DIAGNOSIS — E1142 Type 2 diabetes mellitus with diabetic polyneuropathy: Secondary | ICD-10-CM | POA: Diagnosis not present

## 2018-09-16 DIAGNOSIS — I69354 Hemiplegia and hemiparesis following cerebral infarction affecting left non-dominant side: Secondary | ICD-10-CM | POA: Diagnosis not present

## 2018-09-16 DIAGNOSIS — M6281 Muscle weakness (generalized): Secondary | ICD-10-CM | POA: Diagnosis not present

## 2018-09-16 DIAGNOSIS — Z794 Long term (current) use of insulin: Secondary | ICD-10-CM | POA: Diagnosis not present

## 2018-09-16 DIAGNOSIS — M7542 Impingement syndrome of left shoulder: Secondary | ICD-10-CM | POA: Diagnosis not present

## 2018-09-16 DIAGNOSIS — I110 Hypertensive heart disease with heart failure: Secondary | ICD-10-CM | POA: Diagnosis not present

## 2018-09-16 DIAGNOSIS — J449 Chronic obstructive pulmonary disease, unspecified: Secondary | ICD-10-CM | POA: Diagnosis not present

## 2018-09-16 DIAGNOSIS — M159 Polyosteoarthritis, unspecified: Secondary | ICD-10-CM | POA: Diagnosis not present

## 2018-09-16 DIAGNOSIS — M5030 Other cervical disc degeneration, unspecified cervical region: Secondary | ICD-10-CM | POA: Diagnosis not present

## 2018-09-16 MED ORDER — NYSTATIN 100000 UNIT/GM EX POWD: CUTANEOUS | 2 refills | Status: DC

## 2018-09-16 MED ORDER — PAZEO 0.7 % OP SOLN: 1.0000 [drp] | Freq: Two times a day (BID) | OPHTHALMIC | 2 refills | Status: AC

## 2018-09-16 NOTE — Progress Notes (Unsigned)
pazeo

## 2018-09-16 NOTE — Telephone Encounter (Signed)
Advised patient that medication was called in for her.

## 2018-09-18 DIAGNOSIS — I110 Hypertensive heart disease with heart failure: Secondary | ICD-10-CM | POA: Diagnosis not present

## 2018-09-18 DIAGNOSIS — J449 Chronic obstructive pulmonary disease, unspecified: Secondary | ICD-10-CM | POA: Diagnosis not present

## 2018-09-18 DIAGNOSIS — E1142 Type 2 diabetes mellitus with diabetic polyneuropathy: Secondary | ICD-10-CM | POA: Diagnosis not present

## 2018-09-18 DIAGNOSIS — M5030 Other cervical disc degeneration, unspecified cervical region: Secondary | ICD-10-CM | POA: Diagnosis not present

## 2018-09-18 DIAGNOSIS — M6281 Muscle weakness (generalized): Secondary | ICD-10-CM | POA: Diagnosis not present

## 2018-09-18 DIAGNOSIS — Z794 Long term (current) use of insulin: Secondary | ICD-10-CM | POA: Diagnosis not present

## 2018-09-18 DIAGNOSIS — M159 Polyosteoarthritis, unspecified: Secondary | ICD-10-CM | POA: Diagnosis not present

## 2018-09-18 DIAGNOSIS — I69354 Hemiplegia and hemiparesis following cerebral infarction affecting left non-dominant side: Secondary | ICD-10-CM | POA: Diagnosis not present

## 2018-09-18 DIAGNOSIS — M7542 Impingement syndrome of left shoulder: Secondary | ICD-10-CM | POA: Diagnosis not present

## 2018-09-19 ENCOUNTER — Other Ambulatory Visit: Payer: Self-pay | Admitting: Family Medicine

## 2018-09-20 DIAGNOSIS — M159 Polyosteoarthritis, unspecified: Secondary | ICD-10-CM | POA: Diagnosis not present

## 2018-09-20 DIAGNOSIS — M6281 Muscle weakness (generalized): Secondary | ICD-10-CM | POA: Diagnosis not present

## 2018-09-20 DIAGNOSIS — E1142 Type 2 diabetes mellitus with diabetic polyneuropathy: Secondary | ICD-10-CM | POA: Diagnosis not present

## 2018-09-20 DIAGNOSIS — I110 Hypertensive heart disease with heart failure: Secondary | ICD-10-CM | POA: Diagnosis not present

## 2018-09-20 DIAGNOSIS — I69354 Hemiplegia and hemiparesis following cerebral infarction affecting left non-dominant side: Secondary | ICD-10-CM | POA: Diagnosis not present

## 2018-09-20 DIAGNOSIS — Z794 Long term (current) use of insulin: Secondary | ICD-10-CM | POA: Diagnosis not present

## 2018-09-20 DIAGNOSIS — J449 Chronic obstructive pulmonary disease, unspecified: Secondary | ICD-10-CM | POA: Diagnosis not present

## 2018-09-20 DIAGNOSIS — M7542 Impingement syndrome of left shoulder: Secondary | ICD-10-CM | POA: Diagnosis not present

## 2018-09-20 DIAGNOSIS — M5030 Other cervical disc degeneration, unspecified cervical region: Secondary | ICD-10-CM | POA: Diagnosis not present

## 2018-09-23 ENCOUNTER — Telehealth: Payer: Self-pay | Admitting: Family Medicine

## 2018-09-23 DIAGNOSIS — D329 Benign neoplasm of meninges, unspecified: Secondary | ICD-10-CM | POA: Diagnosis not present

## 2018-09-23 NOTE — Telephone Encounter (Signed)
Pt is calling regarding her Bed , please call, Pt states she was approved, they just need more information

## 2018-09-24 ENCOUNTER — Other Ambulatory Visit: Payer: Self-pay | Admitting: Radiation Therapy

## 2018-09-24 ENCOUNTER — Telehealth: Payer: Self-pay | Admitting: Radiation Oncology

## 2018-09-24 DIAGNOSIS — D320 Benign neoplasm of cerebral meninges: Secondary | ICD-10-CM

## 2018-09-24 NOTE — Telephone Encounter (Signed)
Patient is requesting an order for hospital bed because hers is broken and couldn't be fixed. States she really needs this because she has to sleep propped up on lots of pillows and she needs the hosp bed

## 2018-09-24 NOTE — Telephone Encounter (Signed)
This may be addressed as a visit with Jarrett Soho possibly , while I am out, if not possible then needs to be addressed on my return. I am forwarding response to you both

## 2018-09-24 NOTE — Telephone Encounter (Signed)
New message:    LVM for patient to return call to set up my for her virtual visit.

## 2018-09-25 ENCOUNTER — Encounter: Payer: Self-pay | Admitting: Cardiovascular Disease

## 2018-09-25 ENCOUNTER — Other Ambulatory Visit: Payer: Self-pay

## 2018-09-25 ENCOUNTER — Telehealth: Payer: Self-pay | Admitting: Radiation Oncology

## 2018-09-25 ENCOUNTER — Ambulatory Visit (INDEPENDENT_AMBULATORY_CARE_PROVIDER_SITE_OTHER): Payer: Medicare HMO | Admitting: Cardiovascular Disease

## 2018-09-25 VITALS — BP 129/66 | Temp 99.6°F | Ht 59.0 in | Wt 177.0 lb

## 2018-09-25 DIAGNOSIS — M7542 Impingement syndrome of left shoulder: Secondary | ICD-10-CM | POA: Diagnosis not present

## 2018-09-25 DIAGNOSIS — R0989 Other specified symptoms and signs involving the circulatory and respiratory systems: Secondary | ICD-10-CM | POA: Diagnosis not present

## 2018-09-25 DIAGNOSIS — G4733 Obstructive sleep apnea (adult) (pediatric): Secondary | ICD-10-CM

## 2018-09-25 DIAGNOSIS — Z794 Long term (current) use of insulin: Secondary | ICD-10-CM | POA: Diagnosis not present

## 2018-09-25 DIAGNOSIS — I110 Hypertensive heart disease with heart failure: Secondary | ICD-10-CM | POA: Diagnosis not present

## 2018-09-25 DIAGNOSIS — E785 Hyperlipidemia, unspecified: Secondary | ICD-10-CM

## 2018-09-25 DIAGNOSIS — M6281 Muscle weakness (generalized): Secondary | ICD-10-CM | POA: Diagnosis not present

## 2018-09-25 DIAGNOSIS — R0789 Other chest pain: Secondary | ICD-10-CM | POA: Diagnosis not present

## 2018-09-25 DIAGNOSIS — E1142 Type 2 diabetes mellitus with diabetic polyneuropathy: Secondary | ICD-10-CM | POA: Diagnosis not present

## 2018-09-25 DIAGNOSIS — I69354 Hemiplegia and hemiparesis following cerebral infarction affecting left non-dominant side: Secondary | ICD-10-CM | POA: Diagnosis not present

## 2018-09-25 DIAGNOSIS — M159 Polyosteoarthritis, unspecified: Secondary | ICD-10-CM | POA: Diagnosis not present

## 2018-09-25 DIAGNOSIS — J449 Chronic obstructive pulmonary disease, unspecified: Secondary | ICD-10-CM | POA: Diagnosis not present

## 2018-09-25 DIAGNOSIS — M5030 Other cervical disc degeneration, unspecified cervical region: Secondary | ICD-10-CM | POA: Diagnosis not present

## 2018-09-25 NOTE — Telephone Encounter (Signed)
Pt has appt Monday 7-20

## 2018-09-25 NOTE — Telephone Encounter (Signed)
noted 

## 2018-09-25 NOTE — Progress Notes (Signed)
SUBJECTIVE: The patient presents for follow-up of labile hypertension.  Blood pressure is normal today.  She denies chest pain.  She has some exertional dyspnea and is been smoking more due to increased stress in her life.  She told me that her aunt passed away.  She denies leg swelling.  She said she is very tired because she mistakenly woke up at 4:30 AM.  She also suffers from depression and anxiety and follows with psychiatry.    Review of Systems: As per "subjective", otherwise negative.  Allergies  Allergen Reactions  . Cephalexin Hives  . Iron Nausea And Vomiting  . Milk-Related Compounds Other (See Comments)    Doesn't agree with stomach.   . Penicillins Hives    Has patient had a PCN reaction causing immediate rash, facial/tongue/throat swelling, SOB or lightheadedness with hypotension: Yes Has patient had a PCN reaction causing severe rash involving mucus membranes or skin necrosis: No Has patient had a PCN reaction that required hospitalization No Has patient had a PCN reaction occurring within the last 10 years: No If all of the above answers are "NO", then may proceed with Cephalosporin use.   [name] Phenazopyridine Hcl Hives          Current Outpatient Medications  Medication Sig Dispense Refill  . ACCU-CHEK FASTCLIX LANCETS MISC Use to check blood sugar 4 times a day 306 each 0  . albuterol (VENTOLIN HFA) 108 (90 Base) MCG/ACT inhaler Inhale 1-2 puffs every 6 hours as needed for wheezing, shortness of breath 3 Inhaler 3  . amLODipine (NORVASC) 10 MG tablet Take 1 tablet (10 mg total) by mouth daily. 90 tablet 3  . ascorbic acid (VITAMIN C) 500 MG tablet Take 500 mg by mouth daily.    [name] aspirin EC 81 MG tablet Take 81 mg by mouth daily.    . Blood Glucose Monitoring Suppl (ACCU-CHEK AVIVA) device Use as instructed 1 each 0  . cetirizine (ZYRTEC) 10 MG tablet Take 1 tablet (10 mg total) by mouth daily. 7 tablet 0  . Cholecalciferol (VITAMIN D3) 5000 units CAPS Take  1 capsule by mouth daily.    . clobetasol cream (TEMOVATE) 0.05 %     . clotrimazole-betamethasone (LOTRISONE) cream Apply sparingly twice daily to rash on face for 1 week, then only as needed 45 g 0  . diclofenac sodium (VOLTAREN) 1 % GEL Apply 2 g topically 4 (four) times daily. 1 Tube 3  . DROPLET PEN NEEDLES 32G X 4 MM MISC USE  TO INJECT FOUR TIMES DAILY 400 each 0  . glucose blood (ACCU-CHEK AVIVA) test strip Use to check blood sugar 4 times a day 400 each 2  . hydrOXYzine (ATARAX/VISTARIL) 10 MG tablet Take 1 tablet (10 mg total) by mouth 3 (three) times daily as needed. 30 tablet 0  . insulin aspart (NOVOLOG FLEXPEN) 100 UNIT/ML FlexPen INJECT 10 TO 14 UNITS INTO THE SKIN 3 (THREE) TIMES DAILY WITH MEALS. 45 mL 3  . Insulin Glargine (TOUJEO SOLOSTAR) 300 UNIT/ML SOPN Inject 20 Units into the skin at bedtime. 9 pen 3  . lamoTRIgine (LAMICTAL) 100 MG tablet Take 1 tablet (100 mg total) by mouth 2 (two) times daily. 180 tablet 2  . levothyroxine (SYNTHROID) 50 MCG tablet TAKE 1 TABLET  DAILY  EXCEPT TAKE 1/2 TABLET  ON  SUNDAY 85 tablet 1  . LORazepam (ATIVAN) 0.5 MG tablet Take 1 tablet (0.5 mg total) by mouth 2 (two) times daily. 180 tablet 2  .  losartan (COZAAR) 50 MG tablet TAKE 1 TABLET EVERY DAY 90 tablet 1  . metaxalone (SKELAXIN) 800 MG tablet metaxalone 800 mg tablet  take 1 tablet by mouth 3 times daily as needed.    . metoprolol tartrate (LOPRESSOR) 50 MG tablet TAKE 1 TABLET TWICE DAILY (NEED MD APPOINTMENT) 180 tablet 2  . montelukast (SINGULAIR) 10 MG tablet TAKE 1 TABLET EVERY DAY 90 tablet 1  . Multiple Vitamins-Minerals (HM MULTIVITAMIN ADULT GUMMY PO) Take 1 tablet by mouth daily.    [name] nystatin (MYCOSTATIN/NYSTOP) powder Apply to affected areas four times daily as needed. 30 g 2  . ondansetron (ZOFRAN) 4 MG tablet Take 1 tablet by mouth every 6 hours as needed for nausea. 50 tablet 1  . oxyCODONE-acetaminophen (PERCOCET) 10-325 MG tablet     . pregabalin (LYRICA) 75 MG  capsule Take 1 capsule (75 mg total) by mouth daily.    . RABEprazole (ACIPHEX) 20 MG tablet Take 1 tablet (20 mg total) by mouth 2 (two) times daily. 180 tablet 3  . RESTASIS 0.05 % ophthalmic emulsion Place 1 drop into both eyes 2 (two) times daily.     . risperiDONE (RISPERDAL) 0.5 MG tablet Take 1 tablet (0.5 mg total) by mouth at bedtime. 90 tablet 2  . rosuvastatin (CRESTOR) 5 MG tablet TAKE 1 TABLET AT BEDTIME 90 tablet 1  . sertraline (ZOLOFT) 100 MG tablet Take 1 tablet (100 mg total) by mouth daily. 90 tablet 2  . Tiotropium Bromide-Olodaterol (STIOLTO RESPIMAT) 2.5-2.5 MCG/ACT AERS Inhale 2 puffs into the lungs daily. 12 g 3  . TOUJEO SOLOSTAR 300 UNIT/ML SOPN INJECT 20 UNITS INTO THE SKIN AT BEDTIME. 6 pen 2  . traZODone (DESYREL) 100 MG tablet Take 1 tablet (100 mg total) by mouth at bedtime. 30 tablet 2  . triamcinolone ointment (KENALOG) 0.1 % Apply 1 application topically at bedtime. Apply to elbows at bedtime.    [name] umeclidinium-vilanterol (ANORO ELLIPTA) 62.5-25 MCG/INH AEPB Inhale 1 puff into the lungs daily. 1 each 5  . UNABLE TO FIND Transport Chair x 1  Dx-bilateral leg weakness, frequent falls 1 each 0  . UNABLE TO FIND Rolling walker with seat x 1  DX. Bilateral lower extremity weakness, frequent falls, instability 1 each 0  . UNABLE TO FIND Lift chair x 1  DX M47.16, R29.6 1 each 0   No current facility-administered medications for this visit.     Past Medical History:  Diagnosis Date  . Allergy   . Anemia   . Anxiety    takes Ativan daily  . Arthritis   . Assistance needed for mobility   . Bipolar disorder ([place])    takes Risperdal nightly  . Blood transfusion   . Cancer ([place])    In her gum  . Carpal tunnel syndrome of right wrist 05/23/2011  . Cervical disc disorder with radiculopathy of cervical region 10/31/2012  . Chronic back pain   . Chronic idiopathic constipation   . Chronic neck and back pain   . Colon polyps   . COPD (chronic obstructive  pulmonary disease) with chronic bronchitis ([place]) 09/16/2013   Office Spirometry 10/30/2013-submaximal effort based on appearance of loop and curve. Numbers would fit with severe restriction but her physiologic capability may be better than this. FVC 0.91/44%, and 10.74/45%, FEV1/FVC 0.81, FEF 25-75% 1.43/69%    . Depression    takes Zoloft daily  . Diabetes mellitus    Type II  . Diverticulosis    TCS 9/08  by Dr. Delfin Edis for diarrhea . Bx for micro scopic colitis negative.   . Fibromyalgia   . Frequent falls   . GERD (gastroesophageal reflux disease)    takes Aciphex daily  . Glaucoma    eye drops daily  . Gum symptoms    infection on antibiotic  . Hemiplegia affecting non-dominant side, post-stroke 08/02/2011  . Hyperlipidemia    takes Crestor daily  . Hypertension    takes Amlodipine,Metoprolol,and Clonidine daily  . Hypothyroidism    takes Synthroid daily  . IBS (irritable bowel syndrome)   . Insomnia    takes Trazodone nightly  . 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 Metabolic encephalopathy 03/14/5850  . Migraines    chronic headaches  . Mononeuritis lower limb   . Narcolepsy   . Osteoporosis   . Pancreatitis 2006   due to Depakote with normal EUS   . Schatzki's ring    non critical / EGD with ED 8/2011with RMR  . Seizures ([place])    takes Lamictal daily.Last seizure 3 yrs ago  . Sleep apnea    on CPAP  . Stroke [facility])    left sided weakness, speech changes  . Tubular adenoma of colon     Past Surgical History:  Procedure Laterality Date  . ABDOMINAL HYSTERECTOMY  1978  . BACK SURGERY  July 2012  . BACTERIAL OVERGROWTH TEST N/A 05/05/2013   Procedure: BACTERIAL OVERGROWTH TEST;  Surgeon: Daneil Dolin, MD;  [facility];  Service: Endoscopy;  Laterality: N/A;  7:30  . BIOPSY THYROID  2009  . BRAIN SURGERY  11/2011   resection of meningioma  . BREAST REDUCTION SURGERY  1994  . CARDIAC CATHETERIZATION  05/10/2005   normal coronaries, normal LV systolic  function and EF (Dr. Jackie Plum)  . CARPAL TUNNEL RELEASE Left 07/22/04   Dr. Aline Brochure  . CATARACT EXTRACTION Bilateral   . CHOLECYSTECTOMY  1984  . COLONOSCOPY N/A 09/25/2012   DPO:EUMPNTI diverticulosis.  colonic polyp-removed : tubular adenoma  . CRANIOTOMY  11/23/2011   Procedure: CRANIOTOMY TUMOR EXCISION;  Surgeon: Hosie Spangle, MD;  Location: [place] [facility];  Service: Neurosurgery;  Laterality: N/A;  Craniotomy for tumor resection  . ESOPHAGOGASTRODUODENOSCOPY  12/29/2010   Rourk-Retained food in the esophagus and stomach, small hiatal hernia, status post Maloney dilation of the esophagus  . ESOPHAGOGASTRODUODENOSCOPY N/A 09/25/2012   RWE:[id number] atonic baggy esophagus status post Maloney dilation 51 F. Hiatal hernia  . GIVENS CAPSULE STUDY N/A 01/15/2013   NORMAL.   . IR GENERIC HISTORICAL  03/17/2016   IR RADIOLOGIST EVAL & MGMT 03/17/2016 [facility]INTER[facility] RAD  . LESION REMOVAL N/A 05/31/2015   Procedure: REMOVAL RIGHT AND LEFT LESIONS OF MANDIBLE;  Surgeon: Diona Browner, DDS;  Location: [place];  Service: Oral Surgery;  Laterality: N/A;  . MALONEY DILATION  12/29/2010   RMR;  . NM MYOCAR PERF WALL MOTION  2006   "relavtiely normal" persantine, mild anterior thinning (breast attenuation artifact), no region of scar/ischemia  . OVARIAN CYST REMOVAL    . RECTOCELE REPAIR N/A 06/29/2015   Procedure: POSTERIOR REPAIR (RECTOCELE);  Surgeon: Jonnie Kind, MD;  Location: [facility];  Service: Gynecology;  Laterality: N/A;  . REDUCTION MAMMAPLASTY Bilateral   . SPINE SURGERY  09/29/2010   Dr. Rolena Infante  . surgical excision of 3 tumors from right thigh and right buttock  and left upper thigh  2010  . TOOTH EXTRACTION Bilateral 12/14/2014   Procedure: REMOVAL OF BILATERAL MANDIBULAR EXOSTOSES;  Surgeon:  Diona Browner, DDS;  Location: [place];  Service: Oral Surgery;  Laterality: Bilateral;  . TRANSTHORACIC ECHOCARDIOGRAM  2010   EF 60-65%, mild conc LVH, grade 1 diastolic dysfunction; mildly calcified MV  annulus with mildly thickened leaflets, mildly calcified MR annulus    Social History   Socioeconomic History  . Marital status: Divorced    Spouse name: Not on file  . Number of children: 1  . Years of education: 2  . Highest education level: Not on file  Occupational History  . Occupation: Disabled  Social Needs  . Financial resource strain: Not very hard  . Food insecurity    Worry: Never true    Inability: Never true  . Transportation needs    Medical: No    Non-medical: Not on file  Tobacco Use  . Smoking status: Current Every Day Smoker    Packs/day: 0.50    Years: 30.00    Pack years: 15.00    Types: Cigarettes  . Smokeless tobacco: Never Used  . Tobacco comment: 5-10 cigs a day  Substance and Sexual Activity  . Alcohol use: No    Alcohol/week: 0.0 standard drinks    Comment:    . Drug use: No  . Sexual activity: Not Currently  Lifestyle  . Physical activity    Days per week: 0 days    Minutes per session: 0 min  . Stress: Only a little  Relationships  . Social connections    Talks on phone: More than three times a week    Gets together: Never    Attends religious service: 1 to 4 times per year    Active member of club or organization: No    Attends meetings of clubs or organizations: Never    Relationship status: Divorced  . Intimate partner violence    Fear of current or ex partner: No    Emotionally abused: No    Physically abused: No    Forced sexual activity: No  Other Topics Concern  . Not on file  Social History Narrative   01/29/18 Lives alone, has 3 aides, Mon- Fri 8 hrs, 2 hrs on Sat-Sun, RN manages her meds   Caffeine use: Drink coffee sometimes    Right handed      Vitals:   09/25/18 0900  BP: 129/66  Temp: 99.6 F (37.6 C)  Weight: 177 lb (80.3 kg)  Height: 4\' 11"  (1.499 m)    Wt Readings from Last 3 Encounters:  09/25/18 177 lb (80.3 kg)  09/04/18 176 lb (79.8 kg)  08/16/18 177 lb (80.3 kg)     PHYSICAL EXAM General:  NAD HEENT: Normal. Neck: No JVD, no thyromegaly. Lungs: Clear to auscultation bilaterally with normal respiratory effort. CV: Regular rate and rhythm, normal S1/S2, no S3/S4, no murmur. No pretibial or periankle edema.  No carotid bruit.   Abdomen: Soft, nontender, no distention.  Neurologic: Alert and oriented.  Psych: Flat affect. Skin: Normal. Musculoskeletal: No gross deformities.    ECG: Reviewed above under Subjective   Labs: Lab Results  Component Value Date/Time   K 4.0 08/09/2018 12:54 PM   BUN 23 08/09/2018 12:54 PM   BUN 29 (H) 01/29/2018 01:15 PM   CREATININE 1.03 (H) 08/09/2018 12:54 PM   CREATININE 0.90 07/19/2015 09:25 AM   ALT 25 08/09/2018 12:54 PM   TSH 0.52 10/08/2017 03:59 PM   HGB 10.4 (L) 08/09/2018 12:54 PM   HGB 10.3 (L) 01/29/2018 01:15 PM     Lipids: Lab  Results  Component Value Date/Time   LDLCALC 72 02/11/2018 01:10 PM   CHOL 143 02/11/2018 01:10 PM   TRIG 89 02/11/2018 01:10 PM   HDL 53 02/11/2018 01:10 PM      NST: 07/2017  The study is normal.  This is a low risk study.  The left ventricular ejection fraction is hyperdynamic (>65%).  Blood pressure demonstrated a normal response to exercise.  There was no ST segment deviation noted during stress.  Normal resting and stress perfusion. No ischemia or infarction EF 72%  Echocardiogram: 02/2015 Study Conclusions  - Left ventricle: The cavity size was normal. Wall thickness was increased in a pattern of mild LVH. Systolic function was normal. The estimated ejection fraction was in the range of 60% to 65%. Indeterminate diastolic function. Wall motion was normal; there were no regional wall motion abnormalities. - Aortic valve: Valve area (VTI): 2.06 cm^2. Valve area (Vmax): 2.39 cm^2. - Mitral valve: There was mild regurgitation. - Left atrium: The atrium was mildly dilated. - Atrial septum: No defect or patent foramen ovale was identified. - Pulmonary arteries:  Systolic pressure was moderately increased. PA peak pressure: 45 mm Hg (S). - Technically adequate study.    ASSESSMENT AND PLAN: 1.  Labile hypertension: Blood pressure is normal on amlodipine, losartan, and Lopressor.  No changes to therapy.  2.  Hyperlipidemia: Continue Crestor.  3.  Atypical chest pain: Low risk nuclear stress test in May 2019.  Previously normal coronary angiogram in 2007.  4.  Obstructive sleep apnea: Continue compliance with CPAP encouraged.  Follows with pulmonary.    Disposition: Follow up as needed p  Kate Sable, M.D., F.A.C.C.

## 2018-09-25 NOTE — Patient Instructions (Signed)
Medication Instructions:  Your physician recommends that you continue on your current medications as directed. Please refer to the Current Medication list given to you today.   Labwork: NONE  Testing/Procedures: NONE  Follow-Up: Your physician recommends that you schedule a follow-up appointment in: AS NEEDED      Any Other Special Instructions Will Be Listed Below (If Applicable).     If you need a refill on your cardiac medications before your next appointment, please call your pharmacy.   

## 2018-09-25 NOTE — Progress Notes (Signed)
Location/Histology of Brain Tumor: Recurrent Meningioma  Patient presented with symptoms of:    MRI Brain: 11.2 x 15 mm meningioma.  Previously 9.8 x 13.1 mm in 2019.  Past or anticipated interventions, if any, per neurosurgery:  Dr. Sherwood Gambler - Given her co-morbidities surgery is not an option at this time.  I recommend RT consult.   Past or anticipated interventions, if any, per medical oncology:   Dose of Decadron, if applicable: No  Recent neurologic symptoms, if any:   Seizures: No  Headaches: Yes, takes medicine.  Nausea: No  Dizziness/ataxia: Yes   Difficulty with hand coordination: Occasionally in the right hand.  Focal numbness/weakness: left leg as a result of previous stroke.  Visual deficits/changes: Has some blurred vision that has been there for a while.  Confusion/Memory deficits: Occasionally.  Had some sinus pressure/pain earlier in the week.   SAFETY ISSUES:  Prior radiation? No  Pacemaker/ICD? No  Possible current pregnancy? Hysterectomy  Is the patient on methotrexate? No  Additional Complaints / other details:  -Previous: Grade 1 meningioma 2.8 cm, resected 2013. -Had a fall, leg went numb and couldn't hold balance.

## 2018-09-26 ENCOUNTER — Ambulatory Visit
Admission: RE | Admit: 2018-09-26 | Discharge: 2018-09-26 | Disposition: A | Discharge status: Home / Self Care | Payer: Medicare HMO | Source: Ambulatory Visit | Attending: Radiation Oncology | Admitting: Radiation Oncology

## 2018-09-26 ENCOUNTER — Other Ambulatory Visit: Payer: Self-pay

## 2018-09-26 ENCOUNTER — Ambulatory Visit: Payer: Medicare HMO | Admitting: Radiation Oncology

## 2018-09-26 ENCOUNTER — Encounter: Payer: Self-pay | Admitting: Radiation Oncology

## 2018-09-26 VITALS — Ht 59.0 in | Wt 177.0 lb

## 2018-09-26 DIAGNOSIS — D320 Benign neoplasm of cerebral meninges: Secondary | ICD-10-CM

## 2018-09-26 DIAGNOSIS — Z803 Family history of malignant neoplasm of breast: Secondary | ICD-10-CM | POA: Diagnosis not present

## 2018-09-26 DIAGNOSIS — M159 Polyosteoarthritis, unspecified: Secondary | ICD-10-CM | POA: Diagnosis not present

## 2018-09-26 DIAGNOSIS — Z794 Long term (current) use of insulin: Secondary | ICD-10-CM | POA: Diagnosis not present

## 2018-09-26 DIAGNOSIS — M5030 Other cervical disc degeneration, unspecified cervical region: Secondary | ICD-10-CM | POA: Diagnosis not present

## 2018-09-26 DIAGNOSIS — D329 Benign neoplasm of meninges, unspecified: Secondary | ICD-10-CM | POA: Diagnosis not present

## 2018-09-26 DIAGNOSIS — I69354 Hemiplegia and hemiparesis following cerebral infarction affecting left non-dominant side: Secondary | ICD-10-CM | POA: Diagnosis not present

## 2018-09-26 DIAGNOSIS — J449 Chronic obstructive pulmonary disease, unspecified: Secondary | ICD-10-CM | POA: Diagnosis not present

## 2018-09-26 DIAGNOSIS — M6281 Muscle weakness (generalized): Secondary | ICD-10-CM | POA: Diagnosis not present

## 2018-09-26 DIAGNOSIS — I110 Hypertensive heart disease with heart failure: Secondary | ICD-10-CM | POA: Diagnosis not present

## 2018-09-26 DIAGNOSIS — Z9889 Other specified postprocedural states: Secondary | ICD-10-CM | POA: Diagnosis not present

## 2018-09-26 DIAGNOSIS — M7542 Impingement syndrome of left shoulder: Secondary | ICD-10-CM | POA: Diagnosis not present

## 2018-09-26 DIAGNOSIS — E1142 Type 2 diabetes mellitus with diabetic polyneuropathy: Secondary | ICD-10-CM | POA: Diagnosis not present

## 2018-09-26 NOTE — Progress Notes (Signed)
Radiation Oncology         [phone] ________________________________  Initial Outpatient Consultation - Conducted via telephone due to current COVID-19 concerns for limiting patient exposure  I spoke with the patient to conduct this consult visit via telephone to spare the patient unnecessary potential exposure in the healthcare setting during the current COVID-19 pandemic. The patient was notified in advance and was offered a [place] meeting to allow for face to face communication but unfortunately reported that they did not have the appropriate resources/technology to support such a visit and instead preferred to proceed with a telephone consult.   ________________________________  Name: [name]        MRN: [id number]  Date of Service: 09/26/2018 DOB: 02-10-1951  GH:[id number], Norwood Levo, MD  Jovita Gamma, MD     REFERRING PHYSICIAN: Jovita Gamma, MD   DIAGNOSIS: The encounter diagnosis was Meningioma [facility]).   HISTORY OF PRESENT ILLNESS: [name] is a 68 y.o. female seen at the request of Dr. Sherwood Gambler for recurrent grade 1 meningioma in the region of the sphenoid that was resected in 2013, and at that time was 2.8 cm. She has been followed since resection and about a year ago developed recurrent nose bleeds. She was seen by ENT and this evaluation got her back to Dr. Sherwood Gambler for imaging in August 2019. At that time the MRI revealed enhancing mass measuring 10 x 13 mm in the planum sphenoidal and was 8 x 13 mm in 2017. She continued to have headaches and some blurring of her vision over several months and this prompted a repeat MRI of the brain on 09/12/2018 revealing the lesion measuring 15 x 11 x 6 mm. Dr. Sherwood Gambler did not recommend surgery, and rather, stereotactic radiosurgery. She is contacted by phone to discuss this as a treatment.     PREVIOUS RADIATION THERAPY: No   PAST MEDICAL HISTORY:  Past Medical History:  Diagnosis Date  . Allergy   . Anemia   .  Anxiety    takes Ativan daily  . Arthritis   . Assistance needed for mobility   . Bipolar disorder ([place])    takes Risperdal nightly  . Blood transfusion   . Cancer ([place])    In her gum  . Carpal tunnel syndrome of right wrist 05/23/2011  . Cervical disc disorder with radiculopathy of cervical region 10/31/2012  . Chronic back pain   . Chronic idiopathic constipation   . Chronic neck and back pain   . Colon polyps   . COPD (chronic obstructive pulmonary disease) with chronic bronchitis ([place]) 09/16/2013   Office Spirometry 10/30/2013-submaximal effort based on appearance of loop and curve. Numbers would fit with severe restriction but her physiologic capability may be better than this. FVC 0.91/44%, and 10.74/45%, FEV1/FVC 0.81, FEF 25-75% 1.43/69%    . Depression    takes Zoloft daily  . Diabetes mellitus    Type II  . Diverticulosis    TCS 9/08 by Dr. Delfin Edis for diarrhea . Bx for micro scopic colitis negative.   . Fibromyalgia   . Frequent falls   . GERD (gastroesophageal reflux disease)    takes Aciphex daily  . Glaucoma    eye drops daily  . Gum symptoms    infection on antibiotic  . Hemiplegia affecting non-dominant side, post-stroke 08/02/2011  . Hyperlipidemia    takes Crestor daily  . Hypertension    takes Amlodipine,Metoprolol,and Clonidine daily  . Hypothyroidism    takes Synthroid daily  .  IBS (irritable bowel syndrome)   . Insomnia    takes Trazodone nightly  . 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 Metabolic encephalopathy 0/73/7106  . Migraines    chronic headaches  . Mononeuritis lower limb   . Narcolepsy   . Osteoporosis   . Pancreatitis 2006   due to Depakote with normal EUS   . Schatzki's ring    non critical / EGD with ED 8/2011with RMR  . Seizures ([place])    takes Lamictal daily.Last seizure 3 yrs ago  . Sleep apnea    on CPAP  . Stroke [facility]    left sided weakness, speech changes  . Tubular adenoma of colon        PAST SURGICAL HISTORY: Past  Surgical History:  Procedure Laterality Date  . ABDOMINAL HYSTERECTOMY  1978  . BACK SURGERY  July 2012  . BACTERIAL OVERGROWTH TEST N/A 05/05/2013   Procedure: BACTERIAL OVERGROWTH TEST;  Surgeon: Daneil Dolin, MD;  [facility];  Service: Endoscopy;  Laterality: N/A;  7:30  . BIOPSY THYROID  2009  . BRAIN SURGERY  11/2011   resection of meningioma  . BREAST REDUCTION SURGERY  1994  . CARDIAC CATHETERIZATION  05/10/2005   normal coronaries, normal LV systolic function and EF (Dr. Jackie Plum)  . CARPAL TUNNEL RELEASE Left 07/22/04   Dr. Aline Brochure  . CATARACT EXTRACTION Bilateral   . CHOLECYSTECTOMY  1984  . COLONOSCOPY N/A 09/25/2012   YIR:SWNIOEV diverticulosis.  colonic polyp-removed : tubular adenoma  . CRANIOTOMY  11/23/2011   Procedure: CRANIOTOMY TUMOR EXCISION;  Surgeon: Hosie Spangle, MD;  Location: [place] NEURO ORS;  Service: Neurosurgery;  Laterality: N/A;  Craniotomy for tumor resection  . ESOPHAGOGASTRODUODENOSCOPY  12/29/2010   Rourk-Retained food in the esophagus and stomach, small hiatal hernia, status post Maloney dilation of the esophagus  . ESOPHAGOGASTRODUODENOSCOPY N/A 09/25/2012   [id number] atonic baggy esophagus status post Maloney dilation 64 F. Hiatal hernia  . GIVENS CAPSULE STUDY N/A 01/15/2013   NORMAL.   . IR GENERIC HISTORICAL  03/17/2016   IR RADIOLOGIST EVAL & MGMT 03/17/2016 [facility]-INTERV RAD  . LESION REMOVAL N/A 05/31/2015   Procedure: REMOVAL RIGHT AND LEFT LESIONS OF MANDIBLE;  Surgeon: Diona Browner, DDS;  Location: [place];  Service: Oral Surgery;  Laterality: N/A;  . MALONEY DILATION  12/29/2010   RMR;  . NM MYOCAR PERF WALL MOTION  2006   "relavtiely normal" persantine, mild anterior thinning (breast attenuation artifact), no region of scar/ischemia  . OVARIAN CYST REMOVAL    . RECTOCELE REPAIR N/A 06/29/2015   Procedure: POSTERIOR REPAIR (RECTOCELE);  Surgeon: Jonnie Kind, MD;  Location: [facility] ORS;  Service: Gynecology;  Laterality: N/A;  .  REDUCTION MAMMAPLASTY Bilateral   . SPINE SURGERY  09/29/2010   Dr. Rolena Infante  . surgical excision of 3 tumors from right thigh and right buttock  and left upper thigh  2010  . TOOTH EXTRACTION Bilateral 12/14/2014   Procedure: REMOVAL OF BILATERAL MANDIBULAR EXOSTOSES;  Surgeon: Diona Browner, DDS;  Location: [place];  Service: Oral Surgery;  Laterality: Bilateral;  . TRANSTHORACIC ECHOCARDIOGRAM  2010   EF 60-65%, mild conc LVH, grade 1 diastolic dysfunction; mildly calcified MV annulus with mildly thickened leaflets, mildly calcified MR annulus     FAMILY HISTORY:  Family History  Problem Relation Age of Onset  . Heart attack Mother        HTN  . Pneumonia Father   . Kidney failure Father   . Diabetes  Father   . Pancreatic cancer Sister   . Diabetes Brother   . Hypertension Brother   . Diabetes Brother   . Cancer Sister        breast   . Hypertension Son   . Sleep apnea Son   . Cancer Sister        pancreatic  . Stroke Maternal Grandmother   . Heart attack Maternal Grandfather   . Alcohol abuse Maternal Uncle   . Colon cancer Neg Hx   . Anesthesia problems Neg Hx   . Hypotension Neg Hx   . Malignant hyperthermia Neg Hx   . Pseudochol deficiency Neg Hx      SOCIAL HISTORY:  reports that she has been smoking cigarettes. She has a 15.00 pack-year smoking history. She has never used smokeless tobacco. She reports that she does not drink alcohol or use drugs.   ALLERGIES: Cephalexin, Iron, Milk-related compounds, Penicillins, and Phenazopyridine hcl   MEDICATIONS:  Current Outpatient Medications  Medication Sig Dispense Refill  . ACCU-CHEK FASTCLIX LANCETS MISC Use to check blood sugar 4 times a day 306 each 0  . albuterol (VENTOLIN HFA) 108 (90 Base) MCG/ACT inhaler Inhale 1-2 puffs every 6 hours as needed for wheezing, shortness of breath 3 Inhaler 3  . amLODipine (NORVASC) 10 MG tablet Take 1 tablet (10 mg total) by mouth daily. 90 tablet 3  . ascorbic acid (VITAMIN C)  500 MG tablet Take 500 mg by mouth daily.    [name] aspirin EC 81 MG tablet Take 81 mg by mouth daily.    . Blood Glucose Monitoring Suppl (ACCU-CHEK AVIVA) device Use as instructed 1 each 0  . cetirizine (ZYRTEC) 10 MG tablet Take 1 tablet (10 mg total) by mouth daily. 7 tablet 0  . Cholecalciferol (VITAMIN D3) 5000 units CAPS Take 1 capsule by mouth daily.    . clobetasol cream (TEMOVATE) 0.05 %     . clotrimazole-betamethasone (LOTRISONE) cream Apply sparingly twice daily to rash on face for 1 week, then only as needed 45 g 0  . diclofenac sodium (VOLTAREN) 1 % GEL Apply 2 g topically 4 (four) times daily. 1 Tube 3  . DROPLET PEN NEEDLES 32G X 4 MM MISC USE  TO INJECT FOUR TIMES DAILY 400 each 0  . glucose blood (ACCU-CHEK AVIVA) test strip Use to check blood sugar 4 times a day 400 each 2  . hydrOXYzine (ATARAX/VISTARIL) 10 MG tablet Take 1 tablet (10 mg total) by mouth 3 (three) times daily as needed. 30 tablet 0  . insulin aspart (NOVOLOG FLEXPEN) 100 UNIT/ML FlexPen INJECT 10 TO 14 UNITS INTO THE SKIN 3 (THREE) TIMES DAILY WITH MEALS. 45 mL 3  . Insulin Glargine (TOUJEO SOLOSTAR) 300 UNIT/ML SOPN Inject 20 Units into the skin at bedtime. 9 pen 3  . lamoTRIgine (LAMICTAL) 100 MG tablet Take 1 tablet (100 mg total) by mouth 2 (two) times daily. 180 tablet 2  . levothyroxine (SYNTHROID) 50 MCG tablet TAKE 1 TABLET  DAILY  EXCEPT TAKE 1/2 TABLET  ON  SUNDAY 85 tablet 1  . LORazepam (ATIVAN) 0.5 MG tablet Take 1 tablet (0.5 mg total) by mouth 2 (two) times daily. 180 tablet 2  . losartan (COZAAR) 50 MG tablet TAKE 1 TABLET EVERY DAY 90 tablet 1  . metaxalone (SKELAXIN) 800 MG tablet metaxalone 800 mg tablet  take 1 tablet by mouth 3 times daily as needed.    . metoprolol tartrate (LOPRESSOR) 50 MG tablet TAKE 1 TABLET  TWICE DAILY (NEED MD APPOINTMENT) 180 tablet 2  . montelukast (SINGULAIR) 10 MG tablet TAKE 1 TABLET EVERY DAY 90 tablet 1  . Multiple Vitamins-Minerals (HM MULTIVITAMIN ADULT GUMMY  PO) Take 1 tablet by mouth daily.    [name] nystatin (MYCOSTATIN/NYSTOP) powder Apply to affected areas four times daily as needed. 30 g 2  . ondansetron (ZOFRAN) 4 MG tablet Take 1 tablet by mouth every 6 hours as needed for nausea. 50 tablet 1  . oxyCODONE-acetaminophen (PERCOCET) 10-325 MG tablet     . pregabalin (LYRICA) 75 MG capsule Take 1 capsule (75 mg total) by mouth daily.    . RABEprazole (ACIPHEX) 20 MG tablet Take 1 tablet (20 mg total) by mouth 2 (two) times daily. 180 tablet 3  . RESTASIS 0.05 % ophthalmic emulsion Place 1 drop into both eyes 2 (two) times daily.     . risperiDONE (RISPERDAL) 0.5 MG tablet Take 1 tablet (0.5 mg total) by mouth at bedtime. 90 tablet 2  . rosuvastatin (CRESTOR) 5 MG tablet TAKE 1 TABLET AT BEDTIME 90 tablet 1  . sertraline (ZOLOFT) 100 MG tablet Take 1 tablet (100 mg total) by mouth daily. 90 tablet 2  . Tiotropium Bromide-Olodaterol (STIOLTO RESPIMAT) 2.5-2.5 MCG/ACT AERS Inhale 2 puffs into the lungs daily. 12 g 3  . TOUJEO SOLOSTAR 300 UNIT/ML SOPN INJECT 20 UNITS INTO THE SKIN AT BEDTIME. 6 pen 2  . traZODone (DESYREL) 100 MG tablet Take 1 tablet (100 mg total) by mouth at bedtime. 30 tablet 2  . triamcinolone ointment (KENALOG) 0.1 % Apply 1 application topically at bedtime. Apply to elbows at bedtime.    [name] umeclidinium-vilanterol (ANORO ELLIPTA) 62.5-25 MCG/INH AEPB Inhale 1 puff into the lungs daily. 1 each 5  . UNABLE TO FIND Transport Chair x 1  Dx-bilateral leg weakness, frequent falls 1 each 0  . UNABLE TO FIND Rolling walker with seat x 1  DX. Bilateral lower extremity weakness, frequent falls, instability 1 each 0  . UNABLE TO FIND Lift chair x 1  DX M47.16, R29.6 1 each 0   No current facility-administered medications for this encounter.      REVIEW OF SYSTEMS: On review of systems, the patient reports that she is doing well overall. She has significant tiredness that she reports is somewhat chronic rather than medication related. She  denies any chest pain, shortness of breath, cough, fevers, chills, night sweats, unintended weight changes. She denies any bowel or bladder disturbances, and denies abdominal pain, nausea or vomiting. She denies any new musculoskeletal or joint aches or pains. A complete review of systems is obtained and is otherwise negative.     PHYSICAL EXAM:  Wt Readings from Last 3 Encounters:  09/25/18 177 lb (80.3 kg)  09/04/18 176 lb (79.8 kg)  08/16/18 177 lb (80.3 kg)   Unable to assess due to encounter type. She sounds very tired when talking and reports she can fall asleep very easily. She was awake and participating in our conversation.    ECOG = 1  0 - Asymptomatic (Fully active, able to carry on all predisease activities without restriction)  1 - Symptomatic but completely ambulatory (Restricted in physically strenuous activity but ambulatory and able to carry out work of a light or sedentary nature. For example, light housework, office work)  2 - Symptomatic, <50% in bed during the day (Ambulatory and capable of all self care but unable to carry out any work activities. Up and about more than 50% of waking  hours)  3 - Symptomatic, >50% in bed, but not bedbound (Capable of only limited self-care, confined to bed or chair 50% or more of waking hours)  4 - Bedbound (Completely disabled. Cannot carry on any self-care. Totally confined to bed or chair)  5 - Death   Eustace Pen MM, Creech RH, Tormey DC, et al. [phone]). "Toxicity and response criteria of the [facility] Group". [place] Oncol. 5 (6): 649-55    LABORATORY DATA:  Lab Results  Component Value Date   WBC 7.9 08/09/2018   HGB 10.4 (L) 08/09/2018   HCT 36.1 08/09/2018   MCV 79.9 (L) 08/09/2018   PLT 230 08/09/2018   Lab Results  Component Value Date   NA 143 08/09/2018   K 4.0 08/09/2018   CL 111 08/09/2018   CO2 22 08/09/2018   Lab Results  Component Value Date   ALT 25 08/09/2018   AST 24 08/09/2018    ALKPHOS 99 08/09/2018   BILITOT 0.6 08/09/2018      RADIOGRAPHY: No results found.     IMPRESSION/PLAN: 1. Recurrent grade 1 meningioma. Dr. Lisbeth Renshaw discusses the pathology findings and reviews the nature of meningiomas and treatment options. Given the recurrent nature of her tumor, and that surgery is not recommended, Dr. Lisbeth Renshaw would offer a course of stereotactic radiosurgery. She will need a 3T MRI for treatment planning purposes. She is scheduled for this in two weeks, as well as simulation on 10/10/2018, then treatment on 10/17/2018.  We discussed the risks, benefits, short, and long term effects of radiotherapy, and the patient is interested in proceeding. Dr. Lisbeth Renshaw discusses the delivery and logistics of radiotherapy and anticipates a course of one fraction of SRS treatment. She gives verbal consent to proceed and will sign documentation of written consent at the time of simulation. 2. Claustrophobia. The patient does have Ativan for anxiety and I recommended she take a dose of her Ativan 30 minutes prior to MRIs, simulation, and treatment. I will also reach out to her pscyhiatrist Dr. Harrington Challenger with our recommendations. The patient is in agreement.  3. Concerns about urinary incontinence. I assured her we would make this as comfortable a process we could, and could offer her a depend or diaper to wear during treatment as she is concerned about having an accident on the table during treatment.   Given current concerns for patient exposure during the COVID-19 pandemic, this encounter was conducted via telephone.  The patient has given verbal consent for this type of encounter. The time spent during this encounter was 45 minutes and 50% of that time was spent in the coordination of her care. The attendants for this meeting included Dr. Lisbeth Renshaw, Blenda Nicely, RN, Shona [name], [facility] and Rebbeca Paul Lamping  During the encounter, Blenda Nicely, RN, Dr. Garnet Koyanagi, and Shona [name] [facility] were located at [facility] Radiation Oncology Department.  Rebbeca Paul Zabriskie  was located at home.     Carola Rhine, PAC

## 2018-09-27 ENCOUNTER — Telehealth: Payer: Self-pay | Admitting: *Deleted

## 2018-09-27 ENCOUNTER — Other Ambulatory Visit: Payer: Self-pay | Admitting: Family Medicine

## 2018-09-27 ENCOUNTER — Ambulatory Visit (INDEPENDENT_AMBULATORY_CARE_PROVIDER_SITE_OTHER): Payer: Medicare HMO | Admitting: Psychiatry

## 2018-09-27 DIAGNOSIS — Z794 Long term (current) use of insulin: Secondary | ICD-10-CM | POA: Diagnosis not present

## 2018-09-27 DIAGNOSIS — F331 Major depressive disorder, recurrent, moderate: Secondary | ICD-10-CM | POA: Diagnosis not present

## 2018-09-27 DIAGNOSIS — E785 Hyperlipidemia, unspecified: Secondary | ICD-10-CM | POA: Diagnosis not present

## 2018-09-27 DIAGNOSIS — I110 Hypertensive heart disease with heart failure: Secondary | ICD-10-CM | POA: Diagnosis not present

## 2018-09-27 DIAGNOSIS — M5030 Other cervical disc degeneration, unspecified cervical region: Secondary | ICD-10-CM | POA: Diagnosis not present

## 2018-09-27 DIAGNOSIS — E1142 Type 2 diabetes mellitus with diabetic polyneuropathy: Secondary | ICD-10-CM | POA: Diagnosis not present

## 2018-09-27 DIAGNOSIS — I69354 Hemiplegia and hemiparesis following cerebral infarction affecting left non-dominant side: Secondary | ICD-10-CM | POA: Diagnosis not present

## 2018-09-27 DIAGNOSIS — J449 Chronic obstructive pulmonary disease, unspecified: Secondary | ICD-10-CM | POA: Diagnosis not present

## 2018-09-27 DIAGNOSIS — M159 Polyosteoarthritis, unspecified: Secondary | ICD-10-CM | POA: Diagnosis not present

## 2018-09-27 DIAGNOSIS — Z1231 Encounter for screening mammogram for malignant neoplasm of breast: Secondary | ICD-10-CM

## 2018-09-27 DIAGNOSIS — M6281 Muscle weakness (generalized): Secondary | ICD-10-CM | POA: Diagnosis not present

## 2018-09-27 DIAGNOSIS — G4733 Obstructive sleep apnea (adult) (pediatric): Secondary | ICD-10-CM | POA: Diagnosis not present

## 2018-09-27 DIAGNOSIS — M7542 Impingement syndrome of left shoulder: Secondary | ICD-10-CM | POA: Diagnosis not present

## 2018-09-27 NOTE — Telephone Encounter (Signed)
[name] with [employer] called checking on the order for the hospital bed sent to [facility] medical supply. She was checking to see if the written order and office notes had been sent and wanted to see if there was anything she needed to do. Can be reached at [phone]

## 2018-09-27 NOTE — Progress Notes (Signed)
Virtual Visit via Telephone Note  I connected with [name] on 09/27/18 at 11:00 AM EDT by telephone and verified that I am speaking with the correct person using two identifiers.   I discussed the limitations, risks, security and privacy concerns of performing an evaluation and management service by telephone and the availability of in person appointments. I also discussed with the patient that there may be a patient responsible charge related to this service. The patient expressed understanding and agreed to proceed.  I provided 45 minutes of non-face-to-face time during this encounter.   [name], LCSW             THERAPIST PROGRESS NOTE  Session Time: Friday 09/27/2018 11:00 AM - 11: 45 AM      Participation Level: Active  Behavioral Response: talkative,  depressed   Type of Therapy: Individual Therapy  Treatment Goals addressed: Learn and implement behavioral strategies to overcome depression.                                                       Verbalize an understanding and resolution of current interpersonal problems.   Interventions: CBT and Supportive  Summary: [name] is a 68 y.o. female who presents with  long standing history of recurrent periods  of depression beginning when she was 57 and her favorite uncle died. Patient reports multiple psychiatric hospitlaizations due to depression and suicidal ideations with the last one occuring in 1997. Patient has participated in outpatient psychotherapy and medication management intermittently since age 49. She currently is seeing psychiatrist Dr. Harrington Challenger . Prior to this, she was seen at [facility]. Patient also has had ECT at [facility]. Symptoms have worsened in recent months due to family stress and have  included depressed mood, anxiety, excessive worry, and tearfulness.   Patient's last contact was by virtual visit via telephone  about 4 weeks ago. She reports continued stress related to medical issues. She is  pleased she has more support from medical community.She also reports stress related to her favorite aunt dying about 2 weeks ago and reports she was really depressed at one point. She expresses frustration and anger she was unable to attend the funeral. She is glad she received support from her church. She reports having an assertive conversation with pastor and expressing her feelings. She is pleased with her efforts and results as she reports they have reconciled their differences. Patient continues to rely on her spirituality to cope and reports she now is listening to church services on internet. Suicidal/Homicidal: Nowithout intent/plan  Therapist Response: Reviewed symptoms, discussed stressors, facilitated expression of thoughts and feelings, validated and normalized feelings related to grief and loss issues, praised and reinforced patient's use of assertiveness skills with pastor, discussed effects on patient, assisted patient identify coping statements using her spirituality Plan: Return again in 2 weeks.  Diagnosis: Axis I: MDD, Recurrent, moderate    Axis II: Deferred    [name], LCSW 09/27/2018

## 2018-09-28 NOTE — Telephone Encounter (Signed)
Has appt Monday for hosp bed visit. Will send notes and order to [facility] once note complete

## 2018-09-30 ENCOUNTER — Ambulatory Visit (INDEPENDENT_AMBULATORY_CARE_PROVIDER_SITE_OTHER): Payer: Medicare HMO | Admitting: Family Medicine

## 2018-09-30 ENCOUNTER — Encounter: Payer: Self-pay | Admitting: Family Medicine

## 2018-09-30 ENCOUNTER — Other Ambulatory Visit: Payer: Self-pay

## 2018-09-30 VITALS — BP 150/50 | HR 74 | Resp 12 | Ht 59.0 in | Wt 178.0 lb

## 2018-09-30 DIAGNOSIS — E039 Hypothyroidism, unspecified: Secondary | ICD-10-CM | POA: Diagnosis not present

## 2018-09-30 DIAGNOSIS — I10 Essential (primary) hypertension: Secondary | ICD-10-CM | POA: Diagnosis not present

## 2018-09-30 DIAGNOSIS — Z780 Asymptomatic menopausal state: Secondary | ICD-10-CM | POA: Diagnosis not present

## 2018-09-30 DIAGNOSIS — I69359 Hemiplegia and hemiparesis following cerebral infarction affecting unspecified side: Secondary | ICD-10-CM | POA: Diagnosis not present

## 2018-09-30 DIAGNOSIS — Z794 Long term (current) use of insulin: Secondary | ICD-10-CM

## 2018-09-30 DIAGNOSIS — E1165 Type 2 diabetes mellitus with hyperglycemia: Secondary | ICD-10-CM

## 2018-09-30 DIAGNOSIS — M4716 Other spondylosis with myelopathy, lumbar region: Secondary | ICD-10-CM

## 2018-09-30 DIAGNOSIS — E785 Hyperlipidemia, unspecified: Secondary | ICD-10-CM | POA: Diagnosis not present

## 2018-09-30 DIAGNOSIS — I69354 Hemiplegia and hemiparesis following cerebral infarction affecting left non-dominant side: Secondary | ICD-10-CM | POA: Diagnosis not present

## 2018-09-30 DIAGNOSIS — R399 Unspecified symptoms and signs involving the genitourinary system: Secondary | ICD-10-CM | POA: Diagnosis not present

## 2018-09-30 DIAGNOSIS — M5030 Other cervical disc degeneration, unspecified cervical region: Secondary | ICD-10-CM | POA: Diagnosis not present

## 2018-09-30 DIAGNOSIS — M159 Polyosteoarthritis, unspecified: Secondary | ICD-10-CM | POA: Diagnosis not present

## 2018-09-30 DIAGNOSIS — M6281 Muscle weakness (generalized): Secondary | ICD-10-CM | POA: Diagnosis not present

## 2018-09-30 DIAGNOSIS — J449 Chronic obstructive pulmonary disease, unspecified: Secondary | ICD-10-CM | POA: Diagnosis not present

## 2018-09-30 DIAGNOSIS — R0989 Other specified symptoms and signs involving the circulatory and respiratory systems: Secondary | ICD-10-CM

## 2018-09-30 DIAGNOSIS — E1142 Type 2 diabetes mellitus with diabetic polyneuropathy: Secondary | ICD-10-CM | POA: Diagnosis not present

## 2018-09-30 DIAGNOSIS — Z9181 History of falling: Secondary | ICD-10-CM

## 2018-09-30 DIAGNOSIS — E119 Type 2 diabetes mellitus without complications: Secondary | ICD-10-CM | POA: Diagnosis not present

## 2018-09-30 DIAGNOSIS — I272 Pulmonary hypertension, unspecified: Secondary | ICD-10-CM

## 2018-09-30 DIAGNOSIS — M7542 Impingement syndrome of left shoulder: Secondary | ICD-10-CM | POA: Diagnosis not present

## 2018-09-30 DIAGNOSIS — I110 Hypertensive heart disease with heart failure: Secondary | ICD-10-CM | POA: Diagnosis not present

## 2018-09-30 DIAGNOSIS — Z720 Tobacco use: Secondary | ICD-10-CM | POA: Diagnosis not present

## 2018-09-30 DIAGNOSIS — E559 Vitamin D deficiency, unspecified: Secondary | ICD-10-CM | POA: Diagnosis not present

## 2018-09-30 LAB — POCT URINALYSIS DIP (CLINITEK)
Bilirubin, UA: NEGATIVE
Blood, UA: NEGATIVE
Glucose, UA: NEGATIVE mg/dL
Ketones, POC UA: NEGATIVE mg/dL
Leukocytes, UA: NEGATIVE
Nitrite, UA: NEGATIVE
POC PROTEIN,UA: NEGATIVE
Spec Grav, UA: 1.02 (ref 1.010–1.025)
Urobilinogen, UA: 0.2 E.U./dL
pH, UA: 5.5 (ref 5.0–8.0)
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
## 2018-09-30 NOTE — Patient Instructions (Addendum)
F/U as before, call if you need me sooner  Visit will be used to get your hospital bed  Sorry about your recent loss  Of your Aunt, you are in our prayers   Labs today at [facility] they take [employer]) CBC, lipid,cmp and eGFR, TSH , hBA1C and vit D  Please work on stopping smoking, as  You need to quit  Thanks for choosing [facility], we consider it a privelige to serve you.   Social distancing. Frequent hand washing with soap and water Keeping your hands off of your face. These 3 practices will help to keep both you and your community healthy during this time. Please practice them faithfully!  Social distancing. Frequent hand washing with soap and water Keeping your hands off of your face. These 3 practices will help to keep both you and your community healthy during this time. Please practice them faithfully!

## 2018-10-01 ENCOUNTER — Ambulatory Visit (INDEPENDENT_AMBULATORY_CARE_PROVIDER_SITE_OTHER): Payer: Medicare HMO | Admitting: Podiatry

## 2018-10-01 DIAGNOSIS — E1142 Type 2 diabetes mellitus with diabetic polyneuropathy: Secondary | ICD-10-CM | POA: Diagnosis not present

## 2018-10-01 DIAGNOSIS — I69354 Hemiplegia and hemiparesis following cerebral infarction affecting left non-dominant side: Secondary | ICD-10-CM | POA: Diagnosis not present

## 2018-10-01 DIAGNOSIS — M7542 Impingement syndrome of left shoulder: Secondary | ICD-10-CM | POA: Diagnosis not present

## 2018-10-01 DIAGNOSIS — I110 Hypertensive heart disease with heart failure: Secondary | ICD-10-CM | POA: Diagnosis not present

## 2018-10-01 DIAGNOSIS — Q828 Other specified congenital malformations of skin: Secondary | ICD-10-CM | POA: Diagnosis not present

## 2018-10-01 DIAGNOSIS — M159 Polyosteoarthritis, unspecified: Secondary | ICD-10-CM | POA: Diagnosis not present

## 2018-10-01 DIAGNOSIS — B351 Tinea unguium: Secondary | ICD-10-CM | POA: Diagnosis not present

## 2018-10-01 DIAGNOSIS — Z794 Long term (current) use of insulin: Secondary | ICD-10-CM | POA: Diagnosis not present

## 2018-10-01 DIAGNOSIS — M5030 Other cervical disc degeneration, unspecified cervical region: Secondary | ICD-10-CM | POA: Diagnosis not present

## 2018-10-01 DIAGNOSIS — M79676 Pain in unspecified toe(s): Secondary | ICD-10-CM | POA: Diagnosis not present

## 2018-10-01 DIAGNOSIS — E1140 Type 2 diabetes mellitus with diabetic neuropathy, unspecified: Secondary | ICD-10-CM | POA: Diagnosis not present

## 2018-10-01 DIAGNOSIS — M6281 Muscle weakness (generalized): Secondary | ICD-10-CM | POA: Diagnosis not present

## 2018-10-01 DIAGNOSIS — J449 Chronic obstructive pulmonary disease, unspecified: Secondary | ICD-10-CM | POA: Diagnosis not present

## 2018-10-01 LAB — LIPID PANEL
Cholesterol: 149 mg/dL (ref <200)
HDL: 43 mg/dL — ABNORMAL LOW (ref >=50)
LDL Cholesterol (Calc): 79 mg/dL (calc)
Non-HDL Cholesterol (Calc): 106 mg/dL (calc) (ref <130)
Total CHOL/HDL Ratio: 3.5 (calc) (ref <5.0)
Triglycerides: 173 mg/dL — ABNORMAL HIGH (ref <150)

## 2018-10-01 LAB — COMPLETE METABOLIC PANEL WITH GFR
AG Ratio: 1.6 (calc) (ref 1.0–2.5)
ALT: 20 U/L (ref 6–29)
AST: 23 U/L (ref 10–35)
Albumin: 4.2 g/dL (ref 3.6–5.1)
Alkaline phosphatase (APISO): 85 U/L (ref 37–153)
BUN/Creatinine Ratio: 23 (calc) — ABNORMAL HIGH (ref 6–22)
BUN: 29 mg/dL — ABNORMAL HIGH (ref 7–25)
CO2: 28 mmol/L (ref 20–32)
Calcium: 9.6 mg/dL (ref 8.6–10.4)
Chloride: 108 mmol/L (ref 98–110)
Creat: 1.24 mg/dL — ABNORMAL HIGH (ref 0.50–0.99)
GFR, Est African American: 52 mL/min/{1.73_m2} — ABNORMAL LOW (ref >=60)
GFR, Est Non African American: 45 mL/min/{1.73_m2} — ABNORMAL LOW (ref >=60)
Globulin: 2.7 g/dL (calc) (ref 1.9–3.7)
Glucose, Bld: 119 mg/dL — ABNORMAL HIGH (ref 65–99)
Potassium: 4.2 mmol/L (ref 3.5–5.3)
Sodium: 144 mmol/L (ref 135–146)
Total Bilirubin: 0.3 mg/dL (ref 0.2–1.2)
Total Protein: 6.9 g/dL (ref 6.1–8.1)

## 2018-10-01 LAB — HEMOGLOBIN A1C
Hgb A1c MFr Bld: 5.4 % of total Hgb (ref <5.7)
Mean Plasma Glucose: 108 (calc)
eAG (mmol/L): 6 (calc)

## 2018-10-01 LAB — CBC
HCT: 37.8 % (ref 35.0–45.0)
Hemoglobin: 11 g/dL — ABNORMAL LOW (ref 11.7–15.5)
MCH: 23 pg — ABNORMAL LOW (ref 27.0–33.0)
MCHC: 29.1 g/dL — ABNORMAL LOW (ref 32.0–36.0)
MCV: 78.9 fL — ABNORMAL LOW (ref 80.0–100.0)
MPV: 9.5 fL (ref 7.5–12.5)
Platelets: 257 10*3/uL (ref 140–400)
RBC: 4.79 10*6/uL (ref 3.80–5.10)
RDW: 12.9 % (ref 11.0–15.0)
WBC: 6.9 10*3/uL (ref 3.8–10.8)

## 2018-10-01 LAB — VITAMIN D 25 HYDROXY (VIT D DEFICIENCY, FRACTURES): Vit D, 25-Hydroxy: 54 ng/mL (ref 30–100)

## 2018-10-01 LAB — TSH: TSH: 0.82 mIU/L (ref 0.40–4.50)

## 2018-10-01 NOTE — Addendum Note (Signed)
Addended by: Philemon Kingdom on: 10/01/2018 03:01 PM   Modules accepted: Level of Service

## 2018-10-01 NOTE — Patient Instructions (Signed)
Diabetes Mellitus and Foot Care Foot care is an important part of your health, especially when you have diabetes. Diabetes may cause you to have problems because of poor blood flow (circulation) to your feet and legs, which can cause your skin to:  Become thinner and drier.  Break more easily.  Heal more slowly.  Peel and crack. You may also have nerve damage (neuropathy) in your legs and feet, causing decreased feeling in them. This means that you may not notice minor injuries to your feet that could lead to more serious problems. Noticing and addressing any potential problems early is the best way to prevent future foot problems. How to care for your feet Foot hygiene  Wash your feet daily with warm water and mild soap. Do not use hot water. Then, pat your feet and the areas between your toes until they are completely dry. Do not soak your feet as this can dry your skin.  Trim your toenails straight across. Do not dig under them or around the cuticle. File the edges of your nails with an emery board or nail file.  Apply a moisturizing lotion or petroleum jelly to the skin on your feet and to dry, brittle toenails. Use lotion that does not contain alcohol and is unscented. Do not apply lotion between your toes. Shoes and socks  Wear clean socks or stockings every day. Make sure they are not too tight. Do not wear knee-high stockings since they may decrease blood flow to your legs.  Wear shoes that fit properly and have enough cushioning. Always look in your shoes before you put them on to be sure there are no objects inside.  To break in new shoes, wear them for just a few hours a day. This prevents injuries on your feet. Wounds, scrapes, corns, and calluses  Check your feet daily for blisters, cuts, bruises, sores, and redness. If you cannot see the bottom of your feet, use a mirror or ask someone for help.  Do not cut corns or calluses or try to remove them with medicine.  If you  find a minor scrape, cut, or break in the skin on your feet, keep it and the skin around it clean and dry. You may clean these areas with mild soap and water. Do not clean the area with peroxide, alcohol, or iodine.  If you have a wound, scrape, corn, or callus on your foot, look at it several times a day to make sure it is healing and not infected. Check for: ? Redness, swelling, or pain. ? Fluid or blood. ? Warmth. ? Pus or a bad smell. General instructions  Do not cross your legs. This may decrease blood flow to your feet.  Do not use heating pads or hot water bottles on your feet. They may burn your skin. If you have lost feeling in your feet or legs, you may not know this is happening until it is too late.  Protect your feet from hot and cold by wearing shoes, such as at the beach or on hot pavement.  Schedule a complete foot exam at least once a year (annually) or more often if you have foot problems. If you have foot problems, report any cuts, sores, or bruises to your health care provider immediately. Contact a health care provider if:  You have a medical condition that increases your risk of infection and you have any cuts, sores, or bruises on your feet.  You have an injury that is not   healing.  You have redness on your legs or feet.  You feel burning or tingling in your legs or feet.  You have pain or cramps in your legs and feet.  Your legs or feet are numb.  Your feet always feel cold.  You have pain around a toenail. Get help right away if:  You have a wound, scrape, corn, or callus on your foot and: ? You have pain, swelling, or redness that gets worse. ? You have fluid or blood coming from the wound, scrape, corn, or callus. ? Your wound, scrape, corn, or callus feels warm to the touch. ? You have pus or a bad smell coming from the wound, scrape, corn, or callus. ? You have a fever. ? You have a red line going up your leg. Summary  Check your feet every day  for cuts, sores, red spots, swelling, and blisters.  Moisturize feet and legs daily.  Wear shoes that fit properly and have enough cushioning.  If you have foot problems, report any cuts, sores, or bruises to your health care provider immediately.  Schedule a complete foot exam at least once a year (annually) or more often if you have foot problems. This information is not intended to replace advice given to you by your health care provider. Make sure you discuss any questions you have with your health care provider. Document Released: 02/25/2000 Document Revised: 04/11/2017 Document Reviewed: 03/31/2016 Elsevier Patient Education  2020 [employer]   Onychomycosis/Fungal Toenails  WHAT IS IT? An infection that lies within the keratin of your nail plate that is caused by a fungus.  WHY ME? Fungal infections affect all ages, sexes, races, and creeds.  There may be many factors that predispose you to a fungal infection such as age, coexisting medical conditions such as diabetes, or an autoimmune disease; stress, medications, fatigue, genetics, etc.  Bottom line: fungus thrives in a warm, moist environment and your shoes offer such a location.  IS IT CONTAGIOUS? Theoretically, yes.  You do not want to share shoes, nail clippers or files with someone who has fungal toenails.  Walking around barefoot in the same room or sleeping in the same bed is unlikely to transfer the organism.  It is important to realize, however, that fungus can spread easily from one nail to the next on the same foot.  HOW DO WE TREAT THIS?  There are several ways to treat this condition.  Treatment may depend on many factors such as age, medications, pregnancy, liver and kidney conditions, etc.  It is best to ask your doctor which options are available to you.  1. No treatment.   Unlike many other medical concerns, you can live with this condition.  However for many people this can be a painful condition and may lead to  ingrown toenails or a bacterial infection.  It is recommended that you keep the nails cut short to help reduce the amount of fungal nail. 2. Topical treatment.  These range from herbal remedies to prescription strength nail lacquers.  About 40-50% effective, topicals require twice daily application for approximately 9 to 12 months or until an entirely new nail has grown out.  The most effective topicals are medical grade medications available through physicians offices. 3. Oral antifungal medications.  With an 80-90% cure rate, the most common oral medication requires 3 to 4 months of therapy and stays in your system for a year as the new nail grows out.  Oral antifungal medications do require   blood work to make sure it is a safe drug for you.  A liver function panel will be performed prior to starting the medication and after the first month of treatment.  It is important to have the blood work performed to avoid any harmful side effects.  In general, this medication safe but blood work is required. 4. Laser Therapy.  This treatment is performed by applying a specialized laser to the affected nail plate.  This therapy is noninvasive, fast, and non-painful.  It is not covered by insurance and is therefore, out of pocket.  The results have been very good with a 80-95% cure rate.  [facility] is the only practice in the area to offer this therapy. 5. Permanent Nail Avulsion.  Removing the entire nail so that a new nail will not grow back. 

## 2018-10-03 ENCOUNTER — Telehealth: Payer: Self-pay | Admitting: Internal Medicine

## 2018-10-03 ENCOUNTER — Other Ambulatory Visit: Payer: Self-pay

## 2018-10-03 DIAGNOSIS — M7542 Impingement syndrome of left shoulder: Secondary | ICD-10-CM | POA: Diagnosis not present

## 2018-10-03 DIAGNOSIS — M75122 Complete rotator cuff tear or rupture of left shoulder, not specified as traumatic: Secondary | ICD-10-CM | POA: Diagnosis not present

## 2018-10-03 DIAGNOSIS — M25512 Pain in left shoulder: Secondary | ICD-10-CM | POA: Diagnosis not present

## 2018-10-03 NOTE — Telephone Encounter (Signed)
OK, then no change is needed in her regimen! I am aware of her meningioma - sees Dr. Sherwood Gambler.

## 2018-10-03 NOTE — Telephone Encounter (Signed)
Patient also states she needs blood sugar logs.

## 2018-10-03 NOTE — Telephone Encounter (Signed)
Called patient, phone rang several times then a recording that said "call can not be completed at this time"

## 2018-10-03 NOTE — Telephone Encounter (Signed)
Patient requests to be called at ph# [phone] re: patient saw [name] (PCP) who did labs. Patient states A1C is 5.4. [name] told patient Insulin Glargine (TOUJEO SOLOSTAR) 300 UNIT/ML SOPN dosage needs to be lowered. Please call patient at the ph# listed above to advise.

## 2018-10-03 NOTE — Telephone Encounter (Signed)
Spoke to patient, she said the highest reading has been 200 which was only once or twice, lowest reading 81.  Today her sugars were 147 and 130.  Patient also wanted to let us know she has a brain tumor that has started growing and will be starting radiation therapy next month.

## 2018-10-03 NOTE — Telephone Encounter (Signed)
Notified patient of message from [name], patient expressed understanding and agreement. No further questions.  Patient will call us right away if her numbers start to drop.

## 2018-10-03 NOTE — Telephone Encounter (Signed)
Her HbA1c levels are usually lower than as expected from her sugar logs.  Can you please check with her about her sugars?  If she has lows, will need to decrease the doses, but otherwise, we may need to continue

## 2018-10-03 NOTE — Patient Outreach (Signed)
[facility]) Care Management  10/03/2018   [name] [birth year] [id number]  Subjective: Successful call to the patient. Two patient identifiers given.  The patient states that she is doing fair.  She said her pain today is in her back and arms.  She rates the pain at 8/10.  She has pain medication that helps. She states that she did have a fall last week in her kitchen.  Her aid had not come and she was fixing something to eat.  Her leg went numb and she went down on her knee with her walker.  She states she was able to move to the kitchen table and get in a chair. She did not sustain any injury.  She states that she is with a new company and her new aid starts today.  The aid will be with her for eight hours a day.  She states that she checks her blood pressure daily.  She had not checked it yet today but yesterday it was 149/63.  She states that she continues to watch the salt in her diet.  She is taking her medications as prescribed.  She states that next week on 7/30 she is scheduled for two test for a brain tumor.  She states that she will start radiation for her tumor in August.   Current Medications:  Current Outpatient Medications  Medication Sig Dispense Refill  . ACCU-CHEK FASTCLIX LANCETS MISC Use to check blood sugar 4 times a day 306 each 0  . albuterol (VENTOLIN HFA) 108 (90 Base) MCG/ACT inhaler Inhale 1-2 puffs every 6 hours as needed for wheezing, shortness of breath 3 Inhaler 3  . ascorbic acid (VITAMIN C) 500 MG tablet Take 500 mg by mouth daily.    [name] aspirin EC 81 MG tablet Take 81 mg by mouth daily.    . Blood Glucose Monitoring Suppl (ACCU-CHEK AVIVA) device Use as instructed 1 each 0  . cetirizine (ZYRTEC) 10 MG tablet Take 1 tablet (10 mg total) by mouth daily. 7 tablet 0  . Cholecalciferol (VITAMIN D3) 5000 units CAPS Take 1 capsule by mouth daily.    . clobetasol cream (TEMOVATE) 0.05 %     . clotrimazole-betamethasone (LOTRISONE) cream Apply  sparingly twice daily to rash on face for 1 week, then only as needed 45 g 0  . diclofenac sodium (VOLTAREN) 1 % GEL Apply 2 g topically 4 (four) times daily. 1 Tube 3  . DROPLET PEN NEEDLES 32G X 4 MM MISC USE  TO INJECT FOUR TIMES DAILY 400 each 0  . glucose blood (ACCU-CHEK AVIVA) test strip Use to check blood sugar 4 times a day 400 each 2  . hydrOXYzine (ATARAX/VISTARIL) 10 MG tablet Take 1 tablet (10 mg total) by mouth 3 (three) times daily as needed. 30 tablet 0  . insulin aspart (NOVOLOG FLEXPEN) 100 UNIT/ML FlexPen INJECT 10 TO 14 UNITS INTO THE SKIN 3 (THREE) TIMES DAILY WITH MEALS. 45 mL 3  . Insulin Glargine (TOUJEO SOLOSTAR) 300 UNIT/ML SOPN Inject 20 Units into the skin at bedtime. 9 pen 3  . lamoTRIgine (LAMICTAL) 100 MG tablet Take 1 tablet (100 mg total) by mouth 2 (two) times daily. 180 tablet 2  . levothyroxine (SYNTHROID) 50 MCG tablet TAKE 1 TABLET  DAILY  EXCEPT TAKE 1/2 TABLET  ON  SUNDAY 85 tablet 1  . LORazepam (ATIVAN) 0.5 MG tablet Take 1 tablet (0.5 mg total) by mouth 2 (two) times daily. 180 tablet 2  .  losartan (COZAAR) 50 MG tablet TAKE 1 TABLET EVERY DAY 90 tablet 1  . metaxalone (SKELAXIN) 800 MG tablet metaxalone 800 mg tablet  take 1 tablet by mouth 3 times daily as needed.    . metoprolol tartrate (LOPRESSOR) 50 MG tablet TAKE 1 TABLET TWICE DAILY (NEED MD APPOINTMENT) 180 tablet 2  . montelukast (SINGULAIR) 10 MG tablet TAKE 1 TABLET EVERY DAY 90 tablet 1  . Multiple Vitamins-Minerals (HM MULTIVITAMIN ADULT GUMMY PO) Take 1 tablet by mouth daily.    [name] nystatin (MYCOSTATIN/NYSTOP) powder Apply to affected areas four times daily as needed. 30 g 2  . ondansetron (ZOFRAN) 4 MG tablet Take 1 tablet by mouth every 6 hours as needed for nausea. 50 tablet 1  . oxyCODONE-acetaminophen (PERCOCET) 10-325 MG tablet     . pregabalin (LYRICA) 75 MG capsule Take 1 capsule (75 mg total) by mouth daily.    . RABEprazole (ACIPHEX) 20 MG tablet Take 1 tablet (20 mg total) by  mouth 2 (two) times daily. 180 tablet 3  . RESTASIS 0.05 % ophthalmic emulsion Place 1 drop into both eyes 2 (two) times daily.     . risperiDONE (RISPERDAL) 0.5 MG tablet Take 1 tablet (0.5 mg total) by mouth at bedtime. 90 tablet 2  . rosuvastatin (CRESTOR) 5 MG tablet TAKE 1 TABLET AT BEDTIME 90 tablet 1  . sertraline (ZOLOFT) 100 MG tablet Take 1 tablet (100 mg total) by mouth daily. 90 tablet 2  . Tiotropium Bromide-Olodaterol (STIOLTO RESPIMAT) 2.5-2.5 MCG/ACT AERS Inhale 2 puffs into the lungs daily. 12 g 3  . TOUJEO SOLOSTAR 300 UNIT/ML SOPN INJECT 20 UNITS INTO THE SKIN AT BEDTIME. 6 pen 2  . traZODone (DESYREL) 100 MG tablet Take 1 tablet (100 mg total) by mouth at bedtime. 30 tablet 2  . triamcinolone ointment (KENALOG) 0.1 % Apply 1 application topically at bedtime. Apply to elbows at bedtime.    [name] umeclidinium-vilanterol (ANORO ELLIPTA) 62.5-25 MCG/INH AEPB Inhale 1 puff into the lungs daily. 1 each 5  . amLODipine (NORVASC) 10 MG tablet Take 1 tablet (10 mg total) by mouth daily. 90 tablet 3  . UNABLE TO FIND Transport Chair x 1  Dx-bilateral leg weakness, frequent falls (Patient not taking: Reported on 10/03/2018) 1 each 0  . UNABLE TO FIND Rolling walker with seat x 1  DX. Bilateral lower extremity weakness, frequent falls, instability (Patient not taking: Reported on 10/03/2018) 1 each 0  . UNABLE TO FIND Lift chair x 1  DX M47.16, R29.6 (Patient not taking: Reported on 10/03/2018) 1 each 0   No current facility-administered medications for this visit.     Functional Status:  In your present state of health, do you have any difficulty performing the following activities: 06/12/2018  Hearing? N  Vision? N  Difficulty concentrating or making decisions? Y  Walking or climbing stairs? Y  Dressing or bathing? N  Doing errands, shopping? Y  Preparing Food and eating ? N  Using the Toilet? N  Comment high toilet seat  In the past six months, have you accidently leaked urine? Y   Comment had an accident yesterday morning/ wears pull ups: when out or at bedtime  Do you have problems with loss of bowel control? N  Managing your Medications? Y  Comment has an aide do this  Managing your Finances? Y  Comment niece helps with her bills   Housekeeping or managing your Housekeeping? Y  Comment aide helps with some of this  Some  recent data might be hidden    Fall/Depression Screening: Fall Risk  10/03/2018 09/30/2018 09/04/2018  Falls in the past year? 1 1 1   Number falls in past yr: 0 1 1  Comment - - -  Injury with Fall? 0 1 1  Comment - - -  Risk Factor Category  - - -  Risk for fall due to : History of fall(s);Impaired mobility - -  Follow up Education provided - -   PHQ 2/9 Scores 09/30/2018 09/04/2018 06/12/2018 04/17/2018 01/31/2018 11/20/2017 10/08/2017  PHQ - 2 Score 3 0 2 0 1 3 3   PHQ- 9 Score 8 - 16 - - 6 16  Some encounter information is confidential and restricted. Go to Review Flowsheets activity to see all data.    Assessment: Patient will continue to benefit from health coach outreach for disease management and support. [facility] CM Care Plan Problem One     Most Recent Value  [facility] Long Term Goal   In 90 days the patient will verbalize maintaining her blood pressure and no hospital visits  [facility] Long Term Goal Start Date  10/03/18  Interventions for Problem One Long Term Goal  Reviewed blood presures, talked about low sodium diet ,  encouraged medication adherence and to talk  with her nurse that comes pill box about  any concerns, encouraged her to see if she has family or friends that can spend the night with her if she has to have surgery.         Plan: RN Health Coach will contact patient in the month of August and patient agrees to next outreach. RN Health Coach will follow up with the patient after radiation.  [name] RN, BSN, [place] Direct Dial:  [phone]  Fax:  [phone]

## 2018-10-03 NOTE — Telephone Encounter (Signed)
Logs mailed.  Please advise on the A1c and medication.

## 2018-10-06 ENCOUNTER — Encounter: Payer: Self-pay | Admitting: Podiatry

## 2018-10-06 NOTE — Progress Notes (Signed)
Subjective: [name] presents for preventative foot care with h/o  diabetes, diabetic neuropathy.  She is here for management elongated painful, discolored, thick toenails and calluses which interfere with activities of daily living. Pain is aggravated when wearing enclosed shoe gear. Pain is relieved with periodic professional debridement.  [name], MD is her PCP.    Current Outpatient Medications:  .  ACCU-CHEK FASTCLIX LANCETS MISC, Use to check blood sugar 4 times a day, Disp: 306 each, Rfl: 0 .  albuterol (VENTOLIN HFA) 108 (90 Base) MCG/ACT inhaler, Inhale 1-2 puffs every 6 hours as needed for wheezing, shortness of breath, Disp: 3 Inhaler, Rfl: 3 .  amLODipine (NORVASC) 10 MG tablet, Take 1 tablet (10 mg total) by mouth daily., Disp: 90 tablet, Rfl: 3 .  ascorbic acid (VITAMIN C) 500 MG tablet, Take 500 mg by mouth daily., Disp: , Rfl:  .  aspirin EC 81 MG tablet, Take 81 mg by mouth daily., Disp: , Rfl:  .  Blood Glucose Monitoring Suppl (ACCU-CHEK AVIVA) device, Use as instructed, Disp: 1 each, Rfl: 0 .  cetirizine (ZYRTEC) 10 MG tablet, Take 1 tablet (10 mg total) by mouth daily., Disp: 7 tablet, Rfl: 0 .  Cholecalciferol (VITAMIN D3) 5000 units CAPS, Take 1 capsule by mouth daily., Disp: , Rfl:  .  clobetasol cream (TEMOVATE) 0.05 %, , Disp: , Rfl:  .  clotrimazole-betamethasone (LOTRISONE) cream, Apply sparingly twice daily to rash on face for 1 week, then only as needed, Disp: 45 g, Rfl: 0 .  diclofenac sodium (VOLTAREN) 1 % GEL, Apply 2 g topically 4 (four) times daily., Disp: 1 Tube, Rfl: 3 .  DROPLET PEN NEEDLES 32G X 4 MM MISC, USE  TO INJECT FOUR TIMES DAILY, Disp: 400 each, Rfl: 0 .  glucose blood (ACCU-CHEK AVIVA) test strip, Use to check blood sugar 4 times a day, Disp: 400 each, Rfl: 2 .  hydrOXYzine (ATARAX/VISTARIL) 10 MG tablet, Take 1 tablet (10 mg total) by mouth 3 (three) times daily as needed., Disp: 30 tablet, Rfl: 0 .  insulin aspart (NOVOLOG  FLEXPEN) 100 UNIT/ML FlexPen, INJECT 10 TO 14 UNITS INTO THE SKIN 3 (THREE) TIMES DAILY WITH MEALS., Disp: 45 mL, Rfl: 3 .  Insulin Glargine (TOUJEO SOLOSTAR) 300 UNIT/ML SOPN, Inject 20 Units into the skin at bedtime., Disp: 9 pen, Rfl: 3 .  lamoTRIgine (LAMICTAL) 100 MG tablet, Take 1 tablet (100 mg total) by mouth 2 (two) times daily., Disp: 180 tablet, Rfl: 2 .  levothyroxine (SYNTHROID) 50 MCG tablet, TAKE 1 TABLET  DAILY  EXCEPT TAKE 1/2 TABLET  ON  SUNDAY, Disp: 85 tablet, Rfl: 1 .  LORazepam (ATIVAN) 0.5 MG tablet, Take 1 tablet (0.5 mg total) by mouth 2 (two) times daily., Disp: 180 tablet, Rfl: 2 .  losartan (COZAAR) 50 MG tablet, TAKE 1 TABLET EVERY DAY, Disp: 90 tablet, Rfl: 1 .  metaxalone (SKELAXIN) 800 MG tablet, metaxalone 800 mg tablet  take 1 tablet by mouth 3 times daily as needed., Disp: , Rfl:  .  metoprolol tartrate (LOPRESSOR) 50 MG tablet, TAKE 1 TABLET TWICE DAILY (NEED MD APPOINTMENT), Disp: 180 tablet, Rfl: 2 .  montelukast (SINGULAIR) 10 MG tablet, TAKE 1 TABLET EVERY DAY, Disp: 90 tablet, Rfl: 1 .  Multiple Vitamins-Minerals (HM MULTIVITAMIN ADULT GUMMY PO), Take 1 tablet by mouth daily., Disp: , Rfl:  .  nystatin (MYCOSTATIN/NYSTOP) powder, Apply to affected areas four times daily as needed., Disp: 30 g, Rfl: 2 .  ondansetron (ZOFRAN) 4 MG tablet, Take 1 tablet by mouth every 6 hours as needed for nausea., Disp: 50 tablet, Rfl: 1 .  oxyCODONE-acetaminophen (PERCOCET) 10-325 MG tablet, , Disp: , Rfl:  .  pregabalin (LYRICA) 75 MG capsule, Take 1 capsule (75 mg total) by mouth daily., Disp: , Rfl:  .  RABEprazole (ACIPHEX) 20 MG tablet, Take 1 tablet (20 mg total) by mouth 2 (two) times daily., Disp: 180 tablet, Rfl: 3 .  RESTASIS 0.05 % ophthalmic emulsion, Place 1 drop into both eyes 2 (two) times daily. , Disp: , Rfl:  .  risperiDONE (RISPERDAL) 0.5 MG tablet, Take 1 tablet (0.5 mg total) by mouth at bedtime., Disp: 90 tablet, Rfl: 2 .  rosuvastatin (CRESTOR) 5 MG  tablet, TAKE 1 TABLET AT BEDTIME, Disp: 90 tablet, Rfl: 1 .  sertraline (ZOLOFT) 100 MG tablet, Take 1 tablet (100 mg total) by mouth daily., Disp: 90 tablet, Rfl: 2 .  Tiotropium Bromide-Olodaterol (STIOLTO RESPIMAT) 2.5-2.5 MCG/ACT AERS, Inhale 2 puffs into the lungs daily., Disp: 12 g, Rfl: 3 .  TOUJEO SOLOSTAR 300 UNIT/ML SOPN, INJECT 20 UNITS INTO THE SKIN AT BEDTIME., Disp: 6 pen, Rfl: 2 .  traZODone (DESYREL) 100 MG tablet, Take 1 tablet (100 mg total) by mouth at bedtime., Disp: 30 tablet, Rfl: 2 .  triamcinolone ointment (KENALOG) 0.1 %, Apply 1 application topically at bedtime. Apply to elbows at bedtime., Disp: , Rfl:  .  umeclidinium-vilanterol (ANORO ELLIPTA) 62.5-25 MCG/INH AEPB, Inhale 1 puff into the lungs daily., Disp: 1 each, Rfl: 5 .  UNABLE TO FIND, Transport Chair x 1  Dx-bilateral leg weakness, frequent falls (Patient not taking: Reported on 10/03/2018), Disp: 1 each, Rfl: 0 .  UNABLE TO FIND, Rolling walker with seat x 1  DX. Bilateral lower extremity weakness, frequent falls, instability (Patient not taking: Reported on 10/03/2018), Disp: 1 each, Rfl: 0 .  UNABLE TO FIND, Lift chair x 1  DX M47.16, R29.6 (Patient not taking: Reported on 10/03/2018), Disp: 1 each, Rfl: 0  Allergies  Allergen Reactions  . Cephalexin Hives  . Iron Nausea And Vomiting  . Milk-Related Compounds Other (See Comments)    Doesn't agree with stomach.   . Penicillins Hives    Has patient had a PCN reaction causing immediate rash, facial/tongue/throat swelling, SOB or lightheadedness with hypotension: Yes Has patient had a PCN reaction causing severe rash involving mucus membranes or skin necrosis: No Has patient had a PCN reaction that required hospitalization No Has patient had a PCN reaction occurring within the last 10 years: No If all of the above answers are "NO", then may proceed with Cephalosporin use.   [name] Phenazopyridine Hcl Hives         Objective: Vascular Examination: Capillary  refill time immediate x 10 digits.  Dorsalis pedis pulses 1/4 b/l.  Posterior tibial pulses 1/4 b/l.  Digital hair sparse x 10 digits.  Skin temperature gradient WNL b/l.  Dermatological Examination: Skin with normal turgor, texture and tone b/l.  Toenails 1-5 b/l discolored, thick, dystrophic with subungual debris and pain with palpation to nailbeds due to thickness of nails.  Porokeratotic lesions submet head 1 right, plantarlateral right foot, and plantar heel left foot with tenderness to palpation. No erythema, no edema, no drainage, no flocculence.  Musculoskeletal: Muscle strength 5/5 to all LE muscle groups.  No pain, crepitus or joint limitation with passive/active ROM.  Neurological: Sensation diminished with 10 gram monofilament.  Assessment: 1. Painful onychomycosis toenails 1-5 b/l 2.  Porokeratoses submet head 1 right, plantarlateral right foot, and plantar heel left foot 3. NIDDM with Diabetic neuropathy  Plan: 1. Continue diabetic foot care principles. Literature dispensed on today. 2. Toenails 1-5 b/l were debrided in length and girth without iatrogenic bleeding. 3. Porokeratosis submet head 1 right, plantarlateral right foot, and plantar heel left foot pared and enucleated with sterile scalpel blade without incident. 4. Patient to continue soft, supportive shoe gear 5. Patient to report any pedal injuries to medical professional  6. Follow up 3 months.  7. Patient/POA to call should there be a concern in the interim.

## 2018-10-07 ENCOUNTER — Telehealth: Payer: Self-pay | Admitting: Radiation Therapy

## 2018-10-07 DIAGNOSIS — J449 Chronic obstructive pulmonary disease, unspecified: Secondary | ICD-10-CM | POA: Diagnosis not present

## 2018-10-07 DIAGNOSIS — M7542 Impingement syndrome of left shoulder: Secondary | ICD-10-CM | POA: Diagnosis not present

## 2018-10-07 DIAGNOSIS — M6281 Muscle weakness (generalized): Secondary | ICD-10-CM | POA: Diagnosis not present

## 2018-10-07 DIAGNOSIS — M159 Polyosteoarthritis, unspecified: Secondary | ICD-10-CM | POA: Diagnosis not present

## 2018-10-07 DIAGNOSIS — I69354 Hemiplegia and hemiparesis following cerebral infarction affecting left non-dominant side: Secondary | ICD-10-CM | POA: Diagnosis not present

## 2018-10-07 DIAGNOSIS — Z794 Long term (current) use of insulin: Secondary | ICD-10-CM | POA: Diagnosis not present

## 2018-10-07 DIAGNOSIS — E1142 Type 2 diabetes mellitus with diabetic polyneuropathy: Secondary | ICD-10-CM | POA: Diagnosis not present

## 2018-10-07 DIAGNOSIS — I110 Hypertensive heart disease with heart failure: Secondary | ICD-10-CM | POA: Diagnosis not present

## 2018-10-07 DIAGNOSIS — M5030 Other cervical disc degeneration, unspecified cervical region: Secondary | ICD-10-CM | POA: Diagnosis not present

## 2018-10-07 NOTE — Telephone Encounter (Signed)
Spoke with [name] about her upcoming appointments. We reviewed when each appointment is and what each visit entails. We discussed when she should take her Ativan and reminded her that she will need a driver when talking this kind of medication. She felt better about what to expect and has my contact information to call again with further questions or concerns.   Mont Dutton R.T.(R)(T) Special Procedures Navigator

## 2018-10-09 ENCOUNTER — Other Ambulatory Visit: Payer: Self-pay

## 2018-10-09 ENCOUNTER — Other Ambulatory Visit: Payer: Self-pay | Admitting: Radiation Therapy

## 2018-10-09 ENCOUNTER — Ambulatory Visit
Admission: RE | Admit: 2018-10-09 | Discharge: 2018-10-09 | Disposition: A | Discharge status: Home / Self Care | Payer: Medicare HMO | Source: Ambulatory Visit | Attending: Radiation Oncology | Admitting: Radiation Oncology

## 2018-10-09 ENCOUNTER — Telehealth: Payer: Self-pay | Admitting: *Deleted

## 2018-10-09 DIAGNOSIS — D320 Benign neoplasm of cerebral meninges: Secondary | ICD-10-CM

## 2018-10-09 DIAGNOSIS — D329 Benign neoplasm of meninges, unspecified: Secondary | ICD-10-CM | POA: Diagnosis not present

## 2018-10-09 MED ORDER — GADOBENATE DIMEGLUMINE 529 MG/ML IV SOLN
15.0000 mL | Freq: Once | INTRAVENOUS | Status: AC | PRN
  Administered 2018-10-09: 15 mL via INTRAVENOUS

## 2018-10-09 NOTE — Progress Notes (Signed)
Has armband been applied?  Yes  Does patient have an allergy to IV contrast dye?: no   Has patient ever received premedication for IV contrast dye?: n/a  Does patient take metformin?: no  If patient does take metformin when was the last dose:n/a  Date of lab work: 09/30/2018 BUN: 29 CR: 1.24 Gfr: 52  IV site: Right AC  Has IV site been added to flowsheet?  Yes

## 2018-10-09 NOTE — Telephone Encounter (Signed)
Received a phone call from  imaging stating that the patient was unable to get her MRI Brain done due to terrible pain.  They stated that they could reschedule her for today at 5:00 pm,however her transportation would be unable to accommodate that unless they received a faxed note from her physician stating that the scan needed to be done today.  Transportation company was called and fax number received.  Note was faxed at noon today stating the importance of the scan and it could not be rescheduled.  Call back name and number placed on the fax  Cover sheet in the event they have further questions.  Will continue to follow as necessary.     Gloriajean Dell. Leonie Green, BSN

## 2018-10-10 ENCOUNTER — Ambulatory Visit (INDEPENDENT_AMBULATORY_CARE_PROVIDER_SITE_OTHER): Payer: Medicare HMO | Admitting: Family Medicine

## 2018-10-10 ENCOUNTER — Other Ambulatory Visit: Payer: Self-pay

## 2018-10-10 ENCOUNTER — Encounter: Payer: Self-pay | Admitting: Family Medicine

## 2018-10-10 ENCOUNTER — Telehealth: Payer: Self-pay | Admitting: Family Medicine

## 2018-10-10 ENCOUNTER — Ambulatory Visit
Admission: RE | Admit: 2018-10-10 | Discharge: 2018-10-10 | Disposition: A | Discharge status: Home / Self Care | Payer: Medicare HMO | Source: Ambulatory Visit | Attending: Radiation Oncology | Admitting: Radiation Oncology

## 2018-10-10 ENCOUNTER — Encounter: Payer: Self-pay | Admitting: Radiation Oncology

## 2018-10-10 VITALS — BP 136/57 | HR 65 | Temp 98.4°F | Resp 16 | Wt 174.4 lb

## 2018-10-10 DIAGNOSIS — D329 Benign neoplasm of meninges, unspecified: Secondary | ICD-10-CM | POA: Diagnosis not present

## 2018-10-10 DIAGNOSIS — Z720 Tobacco use: Secondary | ICD-10-CM

## 2018-10-10 DIAGNOSIS — D320 Benign neoplasm of cerebral meninges: Secondary | ICD-10-CM

## 2018-10-10 DIAGNOSIS — H9203 Otalgia, bilateral: Secondary | ICD-10-CM

## 2018-10-10 MED ORDER — SODIUM CHLORIDE 0.9% FLUSH
10.0000 mL | Freq: Once | INTRAVENOUS | Status: AC
  Administered 2018-10-10: 10 mL via INTRAVENOUS

## 2018-10-10 NOTE — Assessment & Plan Note (Signed)
Elevated at visit, no med adjustment at this  time

## 2018-10-10 NOTE — Assessment & Plan Note (Signed)
Pulmonary hypertension and chronic lung disease form smoking make it necessary for pt to have a hospital bed to keep head elevated  More than 30 degrees most of the time

## 2018-10-10 NOTE — Telephone Encounter (Signed)
Patient would like to know if a report has been sent to [facility] (in [place]) (pt was unsure if that was the name of the place) for her hospital bed.

## 2018-10-10 NOTE — Assessment & Plan Note (Signed)
Chronic uncontrolled pain requiring semi electric bed with rails for positioning and to alleviate pain

## 2018-10-10 NOTE — Assessment & Plan Note (Signed)
Asked:confirms currently smokes cigarettes Assess: Unwilling to quit but cutting back Advise: needs to QUIT to reduce risk of cancer, cardio and cerebrovascular disease Assist: counseled for 5 minutes and literature provided Arrange: follow up in 3 months  

## 2018-10-10 NOTE — Progress Notes (Signed)
Virtual Visit via Telephone Note   This visit type was conducted due to national recommendations for restrictions regarding the COVID-19 Pandemic (e.g. social distancing) in an effort to limit this patient's exposure and mitigate transmission in our community.  Due to her co-morbid illnesses, this patient is at least at moderate risk for complications without adequate follow up.  This format is felt to be most appropriate for this patient at this time.  The patient did not have access to video technology/had technical difficulties with video requiring transitioning to audio format only (telephone).  All issues noted in this document were discussed and addressed.  No physical exam could be performed with this format.    Evaluation Performed:  Follow-up visit  Date:  10/10/2018   ID:  [name], DOB [birth year], MRN [id number]  Patient Location: Home Provider Location: Office  Location of Patient: Home Location of Provider: Telehealth Consent was obtain for visit to be over via telehealth. I verified that I am speaking with the correct person using two identifiers.  PCP:  [name], MD   Chief Complaint:  Ear pain  History of Present Illness:    [name] is a 68 y.o. female with ear pain and smoking help  Ms Karaffa reports desire to stop smoking again. She is a light smoker less than 9 cigarettes a day.  Has stopped in past with Chantix, but after 3 months reports it stopped working. She reports smoking due to stress and chronic pain.  Ear pain related to earrings that irritated her ear lobes. They are a little swollen. She reports not wearing ear rings today. She is unsure why it happened, has wore the ear rings before. Denies infection, warmth, or redness of ear lobes.  The patient does not have symptoms concerning for COVID-19 infection (fever, chills, cough, or new shortness of breath).   Past Medical, Surgical, Social History, Allergies, and Medications have  been Reviewed.   Past Medical History:  Diagnosis Date   Allergy    Anemia    Anxiety    takes Ativan daily   Arthritis    Assistance needed for mobility    Bipolar disorder ([place])    takes Risperdal nightly   Blood transfusion    Cancer ([place])    In her gum   Carpal tunnel syndrome of right wrist 05/23/2011   Cervical disc disorder with radiculopathy of cervical region 10/31/2012   Chronic back pain    Chronic idiopathic constipation    Chronic neck and back pain    Colon polyps    COPD (chronic obstructive pulmonary disease) with chronic bronchitis ([place]) 09/16/2013   Office Spirometry 10/30/2013-submaximal effort based on appearance of loop and curve. Numbers would fit with severe restriction but her physiologic capability may be better than this. FVC 0.91/44%, and 10.74/45%, FEV1/FVC 0.81, FEF 25-75% 1.43/69%     Depression    takes Zoloft daily   Diabetes mellitus    Type II   Diverticulosis    TCS 9/08 by Dr. Delfin Edis for diarrhea . Bx for micro scopic colitis negative.    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   Metabolic encephalopathy 3/53/6144   Migraines    chronic headaches   Mononeuritis lower limb    Narcolepsy    Osteoporosis    Pancreatitis 2006   due to Depakote with normal EUS    Schatzki's ring    non critical / EGD with ED 8/2011with RMR   Seizures ([place])    takes Lamictal daily.Last seizure 3 yrs ago   Sleep apnea    on CPAP   Stroke [facility])    left sided  weakness, speech changes   Tubular adenoma of colon    Past Surgical History:  Procedure Laterality Date   ABDOMINAL HYSTERECTOMY  1978   BACK SURGERY  July 2012   BACTERIAL OVERGROWTH TEST N/A 05/05/2013   Procedure: BACTERIAL OVERGROWTH TEST;  Surgeon: Daneil Dolin, MD;  [facility];  Service: Endoscopy;  Laterality: N/A;  7:30   BIOPSY THYROID  2009   BRAIN SURGERY  11/2011   resection of meningioma   BREAST REDUCTION SURGERY  1994   CARDIAC CATHETERIZATION  05/10/2005   normal coronaries, normal LV systolic function and EF (Dr. Jackie Plum)   [place] Left 07/22/04   Dr. Aline Brochure   CATARACT EXTRACTION Bilateral    CHOLECYSTECTOMY  1984   COLONOSCOPY N/A 09/25/2012   [id number] diverticulosis.  colonic polyp-removed : tubular adenoma   CRANIOTOMY  11/23/2011   Procedure: CRANIOTOMY TUMOR EXCISION;  Surgeon: Hosie Spangle, MD;  Location: [place] NEURO ORS;  Service: Neurosurgery;  Laterality: N/A;  Craniotomy for tumor resection   ESOPHAGOGASTRODUODENOSCOPY  12/29/2010   [place]k-Retained food in the esophagus and stomach, small hiatal hernia, status post Maloney dilation of the esophagus   ESOPHAGOGASTRODUODENOSCOPY N/A 09/25/2012   [id number] atonic baggy esophagus status post Maloney dilation 37 F. Hiatal hernia   GIVENS CAPSULE STUDY N/A 01/15/2013   NORMAL.    IR GENERIC HISTORICAL  03/17/2016   IR RADIOLOGIST EVAL & MGMT 03/17/2016 [facility]-INTER[facility] RAD   LESION REMOVAL N/A 05/31/2015   Procedure: REMOVAL RIGHT AND LEFT LESIONS OF MANDIBLE;  Surgeon: Diona Browner, DDS;  Location: [place];  Service: Oral Surgery;  Laterality: N/A;   MALONEY DILATION  12/29/2010   RMR;   NM MYOCAR PERF WALL MOTION  2006   "relavtiely normal" persantine, mild anterior thinning (breast attenuation artifact), no region of scar/ischemia   OVARIAN CYST REMOVAL     RECTOCELE REPAIR N/A 06/29/2015   Procedure: POSTERIOR REPAIR (RECTOCELE);  Surgeon: Jonnie Kind, MD;   Location: [facility] ORS;  Service: Gynecology;  Laterality: N/A;   REDUCTION MAMMAPLASTY Bilateral    SPINE SURGERY  09/29/2010   Dr. Rolena Infante   surgical excision of 3 tumors from right thigh and right buttock  and left upper thigh  2010   TOOTH EXTRACTION Bilateral 12/14/2014   Procedure: REMOVAL OF BILATERAL MANDIBULAR EXOSTOSES;  Surgeon: Diona Browner, DDS;  Location: [place];  Service: Oral Surgery;  Laterality: Bilateral;   TRANSTHORACIC ECHOCARDIOGRAM  2010   EF 60-65%, mild conc LVH, grade 1 diastolic dysfunction; mildly calcified MV annulus with mildly thickened leaflets, mildly calcified MR annulus     Current Meds  Medication Sig   ACCU-CHEK FASTCLIX LANCETS MISC Use to check blood sugar 4 times a day   albuterol (VENTOLIN HFA) 108 (90 Base) MCG/ACT inhaler Inhale  1-2 puffs every 6 hours as needed for wheezing, shortness of breath   ascorbic acid (VITAMIN C) 500 MG tablet Take 500 mg by mouth daily.   aspirin EC 81 MG tablet Take 81 mg by mouth daily.   Blood Glucose Monitoring Suppl (ACCU-CHEK AVIVA) device Use as instructed   cetirizine (ZYRTEC) 10 MG tablet Take 1 tablet (10 mg total) by mouth daily.   Cholecalciferol (VITAMIN D3) 5000 units CAPS Take 1 capsule by mouth daily.   clobetasol cream (TEMOVATE) 0.05 %    clotrimazole-betamethasone (LOTRISONE) cream Apply sparingly twice daily to rash on face for 1 week, then only as needed   diclofenac sodium (VOLTAREN) 1 % GEL Apply 2 g topically 4 (four) times daily.   DROPLET PEN NEEDLES 32G X 4 MM MISC USE  TO INJECT FOUR TIMES DAILY   glucose blood (ACCU-CHEK AVIVA) test strip Use to check blood sugar 4 times a day   hydrOXYzine (ATARAX/VISTARIL) 10 MG tablet Take 1 tablet (10 mg total) by mouth 3 (three) times daily as needed.   insulin aspart (NOVOLOG FLEXPEN) 100 UNIT/ML FlexPen INJECT 10 TO 14 UNITS INTO THE SKIN 3 (THREE) TIMES DAILY WITH MEALS.   Insulin Glargine (TOUJEO SOLOSTAR) 300 UNIT/ML SOPN Inject 20 Units  into the skin at bedtime.   lamoTRIgine (LAMICTAL) 100 MG tablet Take 1 tablet (100 mg total) by mouth 2 (two) times daily.   levothyroxine (SYNTHROID) 50 MCG tablet TAKE 1 TABLET  DAILY  EXCEPT TAKE 1/2 TABLET  ON  SUNDAY   LORazepam (ATIVAN) 0.5 MG tablet Take 1 tablet (0.5 mg total) by mouth 2 (two) times daily.   losartan (COZAAR) 50 MG tablet TAKE 1 TABLET EVERY DAY   metaxalone (SKELAXIN) 800 MG tablet metaxalone 800 mg tablet  take 1 tablet by mouth 3 times daily as needed.   metoprolol tartrate (LOPRESSOR) 50 MG tablet TAKE 1 TABLET TWICE DAILY (NEED MD APPOINTMENT)   montelukast (SINGULAIR) 10 MG tablet TAKE 1 TABLET EVERY DAY   Multiple Vitamins-Minerals (HM MULTIVITAMIN ADULT GUMMY PO) Take 1 tablet by mouth daily.   nystatin (MYCOSTATIN/NYSTOP) powder Apply to affected areas four times daily as needed.   ondansetron (ZOFRAN) 4 MG tablet Take 1 tablet by mouth every 6 hours as needed for nausea.   oxyCODONE-acetaminophen (PERCOCET) 10-325 MG tablet    pregabalin (LYRICA) 75 MG capsule Take 1 capsule (75 mg total) by mouth daily.   RABEprazole (ACIPHEX) 20 MG tablet Take 1 tablet (20 mg total) by mouth 2 (two) times daily.   RESTASIS 0.05 % ophthalmic emulsion Place 1 drop into both eyes 2 (two) times daily.    risperiDONE (RISPERDAL) 0.5 MG tablet Take 1 tablet (0.5 mg total) by mouth at bedtime.   rosuvastatin (CRESTOR) 5 MG tablet TAKE 1 TABLET AT BEDTIME   sertraline (ZOLOFT) 100 MG tablet Take 1 tablet (100 mg total) by mouth daily.   Tiotropium Bromide-Olodaterol (STIOLTO RESPIMAT) 2.5-2.5 MCG/ACT AERS Inhale 2 puffs into the lungs daily.   TOUJEO SOLOSTAR 300 UNIT/ML SOPN INJECT 20 UNITS INTO THE SKIN AT BEDTIME.   traZODone (DESYREL) 100 MG tablet Take 1 tablet (100 mg total) by mouth at bedtime.   triamcinolone ointment (KENALOG) 0.1 % Apply 1 application topically at bedtime. Apply to elbows at bedtime.   umeclidinium-vilanterol (ANORO ELLIPTA) 62.5-25  MCG/INH AEPB Inhale 1 puff into the lungs daily.   UNABLE TO FIND Transport Chair x 1  Dx-bilateral leg weakness, frequent falls   UNABLE TO FIND Rolling  walker with seat x 1  DX. Bilateral lower extremity weakness, frequent falls, instability   UNABLE TO FIND Lift chair x 1  DX M47.16, R29.6     Allergies:   Cephalexin, Iron, Milk-related compounds, Penicillins, and Phenazopyridine hcl   Social History   Tobacco Use   Smoking status: Current Every Day Smoker    Packs/day: 0.50    Years: 30.00    Pack years: 15.00    Types: Cigarettes   Smokeless tobacco: Never Used   Tobacco comment: 5-10 cigs a day  Substance Use Topics   Alcohol use: No    Alcohol/week: 0.0 standard drinks    Comment:     Drug use: No     Family Hx: The patient's family history includes Alcohol abuse in her maternal uncle; Cancer in her sister and sister; Diabetes in her brother, brother, and father; Heart attack in her maternal grandfather and mother; Hypertension in her brother and son; Kidney failure in her father; Pancreatic cancer in her sister; Pneumonia in her father; Sleep apnea in her son; Stroke in her maternal grandmother. There is no history of Colon cancer, Anesthesia problems, Hypotension, Malignant hyperthermia, or Pseudochol deficiency.  ROS:   Please see the history of present illness.    All other systems reviewed and are negative.   Labs/Other Tests and Data Reviewed:    Recent Labs: 09/30/2018: ALT 20; BUN 29; Creat 1.24; Hemoglobin 11.0; Platelets 257; Potassium 4.2; Sodium 144; TSH 0.82   Recent Lipid Panel Lab Results  Component Value Date/Time   CHOL 149 09/30/2018 11:43 AM   CHOL 143 02/11/2018 01:10 PM   TRIG 173 (H) 09/30/2018 11:43 AM   HDL 43 (L) 09/30/2018 11:43 AM   HDL 53 02/11/2018 01:10 PM   CHOLHDL 3.5 09/30/2018 11:43 AM   LDLCALC 79 09/30/2018 11:43 AM    Wt Readings from Last 3 Encounters:  09/30/18 178 lb (80.7 kg)  09/26/18 177 lb (80.3 kg)    09/25/18 177 lb (80.3 kg)     Objective:    Vital Signs:  There were no vitals taken for this visit. phone visit  GEN:  alert and oriented RESPIRATORY:  no shortness of breath in conversation PSYCH:  pleasant mood and affect, good communication  ASSESSMENT & PLAN:    1. Tobacco user Light smoker, stress and pain bring her back to smoking even though she has quit before. Tried chantix, little relief for a few months. Would like something else if possible. Will discuss with Dr Moshe Cipro prior to ordering.  Asked about quitting: confirms they are currently smokes cigarettes Advise to quit smoking: Educated about QUITTING to reduce the risk of cancer, cardio and cerebrovascular disease. Assess willingness: Unwilling to quit at this time, but is working on cutting back. Assist with counseling and pharmacotherapy: Counseled for 5 minutes and literature provided. Arrange for follow up: trying to quit follow up in 3 months and continue to offer help.   2. Ear pain, bilateral Might have wore an ear ring with nickel or another metal that irritated the skin/ear. Advised to avoid ears for a little while and clean it well daily. Should resolved in a few days.  Time:   Today, I have spent 10 minutes with the patient with telehealth technology discussing the above problems.     Medication Adjustments/Labs and Tests Ordered: Current medicines are reviewed at length with the patient today.  Concerns regarding medicines are outlined above.   Tests Ordered: No orders of the defined  types were placed in this encounter.   Medication Changes: No orders of the defined types were placed in this encounter.   Disposition:  Follow up prn  Signed, Perlie Mayo, NP  10/10/2018 10:00 AM     [facility]

## 2018-10-10 NOTE — Telephone Encounter (Signed)
Waiting on the completed OV note

## 2018-10-10 NOTE — Patient Instructions (Signed)
Thank you for coming into the office today. I appreciate the opportunity to provide you with the care for your health and wellness. Today we discussed: ear pain and smoking cessation  Follow Up: has appt in Oct currently  No labs or referrals  Ear pain: Keep ear clean, avoid earrings for the next week or so. This should improve the pain and swelling. Avoid those ear rings in future as well. Please clean ear rings well also.  Smoking: Will discuss with Dr Moshe Cipro a possible medication to take. Until then I have attached some information that might help you cope with stopping.  GOOD LUCK WITH THIS! WE ARE CHEERING YOU ON!  Please continue to practice social distancing to keep you, your family, and our community safe.  If you must go out, please wear a Mask and practice good handwashing.   [place] YOUR HANDS WELL AND FREQUENTLY. AVOID TOUCHING YOUR FACE, UNLESS YOUR HANDS ARE FRESHLY WASHED.   GET FRESH AIR DAILY. STAY HYDRATED WITH WATER.   It was a pleasure to see you and I look forward to continuing to work together on your health and well-being. Please do not hesitate to call the office if you need care or have questions about your care.  Have a wonderful day and week.  With Gratitude,  Cherly Beach, DNP, AGNP-BC   Coping with Quitting Smoking  Quitting smoking is a physical and mental challenge. You will face cravings, withdrawal symptoms, and temptation. Before quitting, work with your health care provider to make a plan that can help you cope. Preparation can help you quit and keep you from giving in. How can I cope with cravings? Cravings usually last for 5-10 minutes. If you get through it, the craving will pass. Consider taking the following actions to help you cope with cravings:  Keep your mouth busy: ? Chew sugar-free gum. ? Suck on hard candies or a straw. ? Brush your teeth.  Keep your hands and body busy: ? Immediately change to a different activity when  you feel a craving. ? Squeeze or play with a ball. ? Do an activity or a hobby, like making bead jewelry, practicing needlepoint, or working with wood. ? Mix up your normal routine. ? Take a short exercise break. Go for a quick walk or run up and down stairs. ? Spend time in public places where smoking is not allowed.  Focus on doing something kind or helpful for someone else.  Call a friend or family member to talk during a craving.  Join a support group.  Call a quit line, such as 1-800-QUIT-NOW.  Talk with your health care provider about medicines that might help you cope with cravings and make quitting easier for you. How can I deal with withdrawal symptoms? Your body may experience negative effects as it tries to get used to not having nicotine in the system. These effects are called withdrawal symptoms. They may include:  Feeling hungrier than normal.  Trouble concentrating.  Irritability.  Trouble sleeping.  Feeling depressed.  Restlessness and agitation.  Craving a cigarette. To manage withdrawal symptoms:  Avoid places, people, and activities that trigger your cravings.  Remember why you want to quit.  Get plenty of sleep.  Avoid coffee and other caffeinated drinks. These may worsen some of your symptoms. How can I handle social situations? Social situations can be difficult when you are quitting smoking, especially in the first few weeks. To manage this, you can:  Avoid parties,  bars, and other social situations where people might be smoking.  Avoid alcohol.  Leave right away if you have the urge to smoke.  Explain to your family and friends that you are quitting smoking. Ask for understanding and support.  Plan activities with friends or family where smoking is not an option. What are some ways I can cope with stress? Wanting to smoke may cause stress, and stress can make you want to smoke. Find ways to manage your stress. Relaxation techniques can  help. For example:  Breathe slowly and deeply, in through your nose and out through your mouth.  Listen to soothing, relaxing music.  Talk with a family member or friend about your stress.  Light a candle.  Soak in a bath or take a shower.  Think about a peaceful place. What are some ways I can prevent weight gain? Be aware that many people gain weight after they quit smoking. However, not everyone does. To keep from gaining weight, have a plan in place before you quit and stick to the plan after you quit. Your plan should include:  Having healthy snacks. When you have a craving, it may help to: ? Eat plain popcorn, crunchy carrots, celery, or other cut vegetables. ? Chew sugar-free gum.  Changing how you eat: ? Eat small portion sizes at meals. ? Eat 4-6 small meals throughout the day instead of 1-2 large meals a day. ? Be mindful when you eat. Do not watch television or do other things that might distract you as you eat.  Exercising regularly: ? Make time to exercise each day. If you do not have time for a long workout, do short bouts of exercise for 5-10 minutes several times a day. ? Do some form of strengthening exercise, like weight lifting, and some form of aerobic exercise, like running or swimming.  Drinking plenty of water or other low-calorie or no-calorie drinks. Drink 6-8 glasses of water daily, or as much as instructed by your health care provider. Summary  Quitting smoking is a physical and mental challenge. You will face cravings, withdrawal symptoms, and temptation to smoke again. Preparation can help you as you go through these challenges.  You can cope with cravings by keeping your mouth busy (such as by chewing gum), keeping your body and hands busy, and making calls to family, friends, or a helpline for people who want to quit smoking.  You can cope with withdrawal symptoms by avoiding places where people smoke, avoiding drinks with caffeine, and getting  plenty of rest.  Ask your health care provider about the different ways to prevent weight gain, avoid stress, and handle social situations. This information is not intended to replace advice given to you by your health care provider. Make sure you discuss any questions you have with your health care provider. Document Released: 02/25/2016 Document Revised: 02/09/2017 Document Reviewed: 02/25/2016 Elsevier Patient Education  2020 [employer].

## 2018-10-12 DIAGNOSIS — J449 Chronic obstructive pulmonary disease, unspecified: Secondary | ICD-10-CM | POA: Diagnosis not present

## 2018-10-12 DIAGNOSIS — I10 Essential (primary) hypertension: Secondary | ICD-10-CM | POA: Diagnosis not present

## 2018-10-14 IMAGING — US US SOFT TISSUE HEAD/NECK
1 series · 13 of 25 positions shown · non-contrast
Comparison: Most recent prior thyroid ultrasound 11/09/2015 ;
initial thyroid ultrasound 10/11/2012

CLINICAL DATA: Goiter. 66-year-old female with history of
multinodular goiter. She has undergone prior biopsy of isthmic and
left inferior thyroid nodules in Thursday October, 2012.

EXAM:
THYROID ULTRASOUND
TECHNIQUE: Ultrasound examination of the thyroid gland and adjacent soft
tissues was performed.

[Series 1: us soft tissue head/neck · 0.04mm/px · 63 acquisitions, 13 frames shown]
[im 1/63]
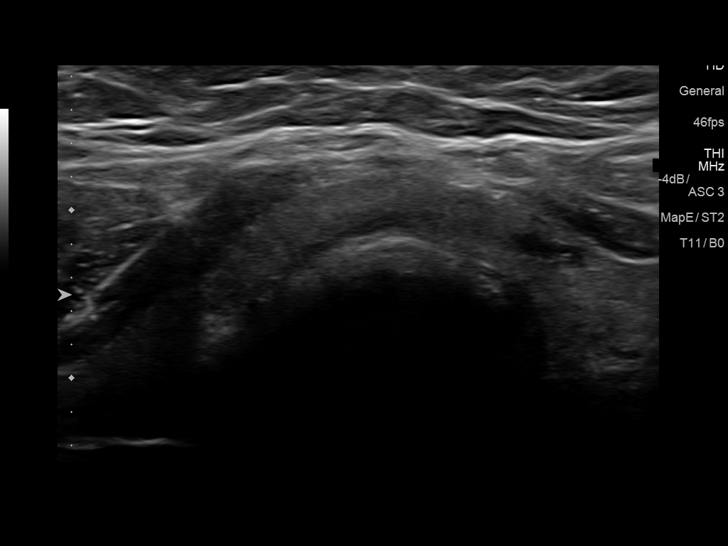
[im 6/63]
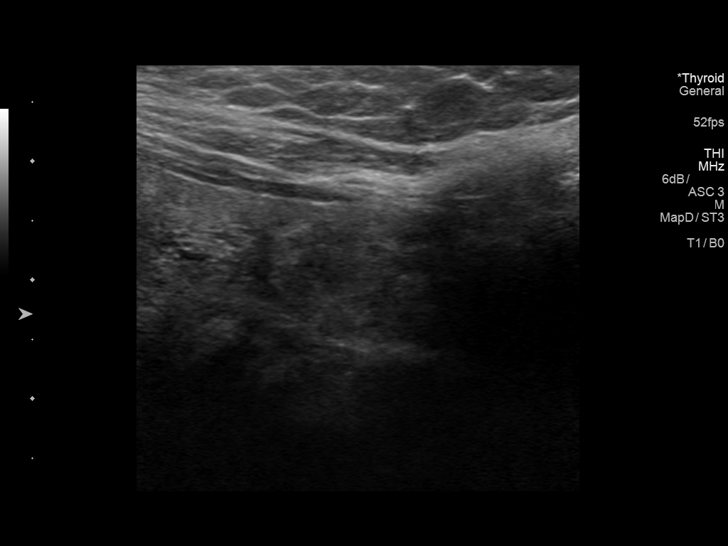
[im 11/63]
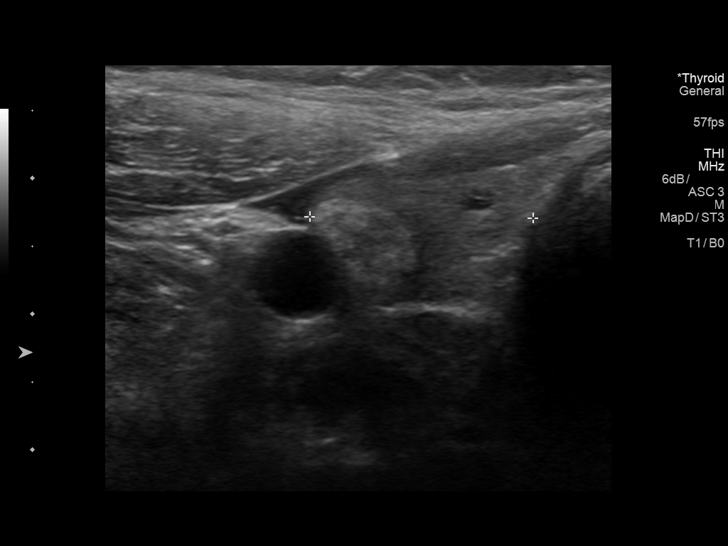
[im 16/63]
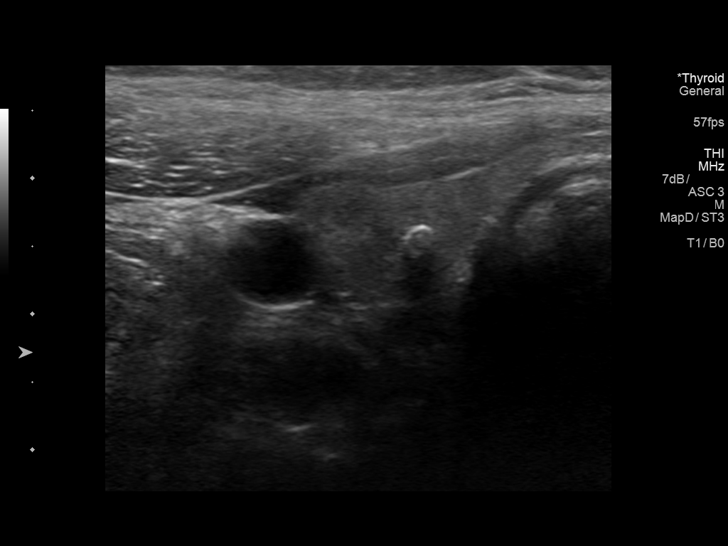
[im 21/63]
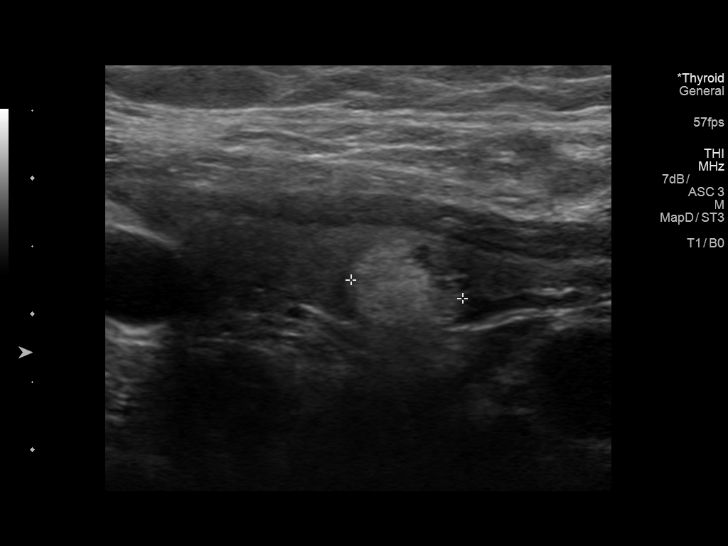
[im 26/63]
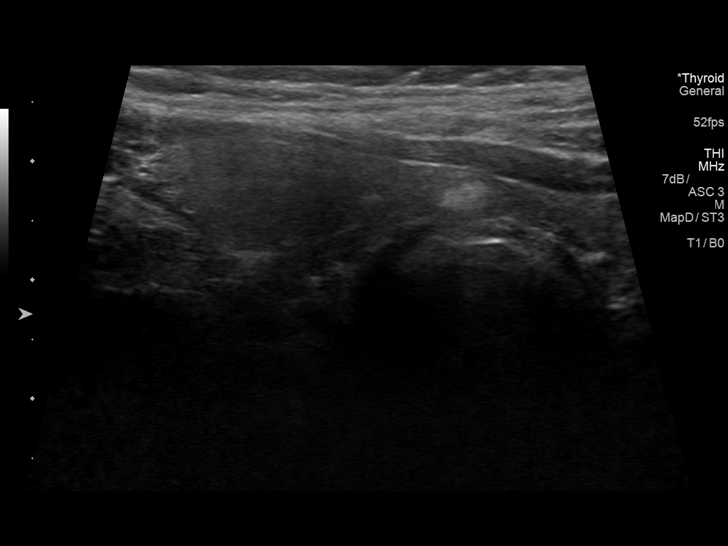
[im 32/63]
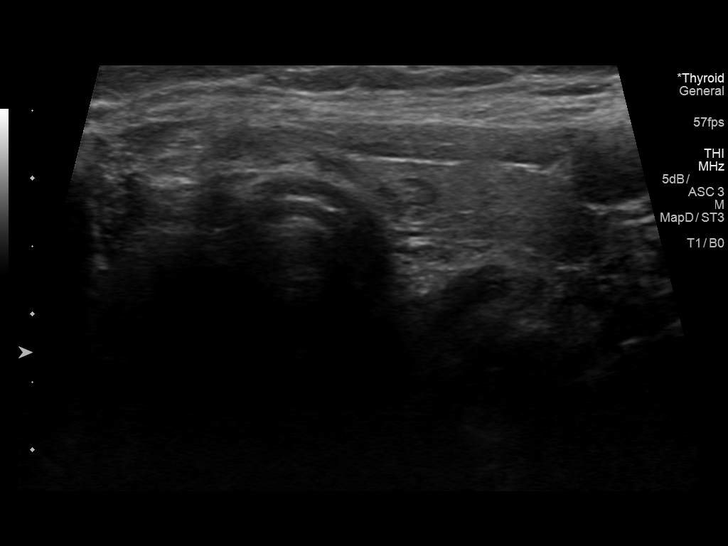
[im 37/63]
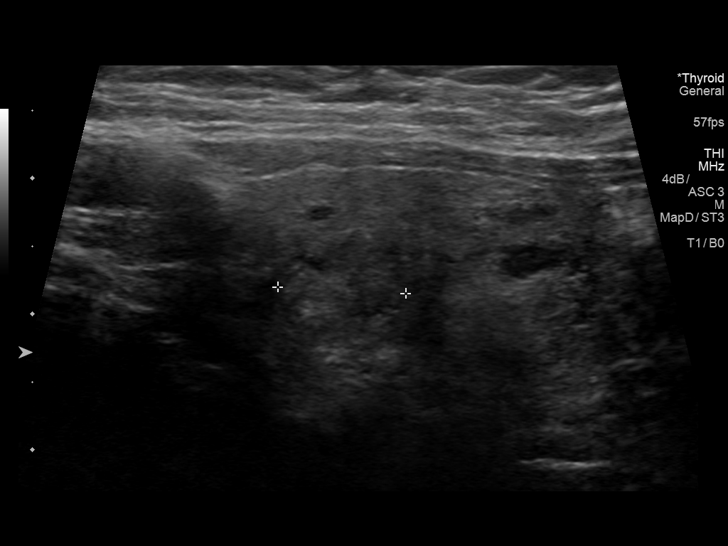
[im 42/63]
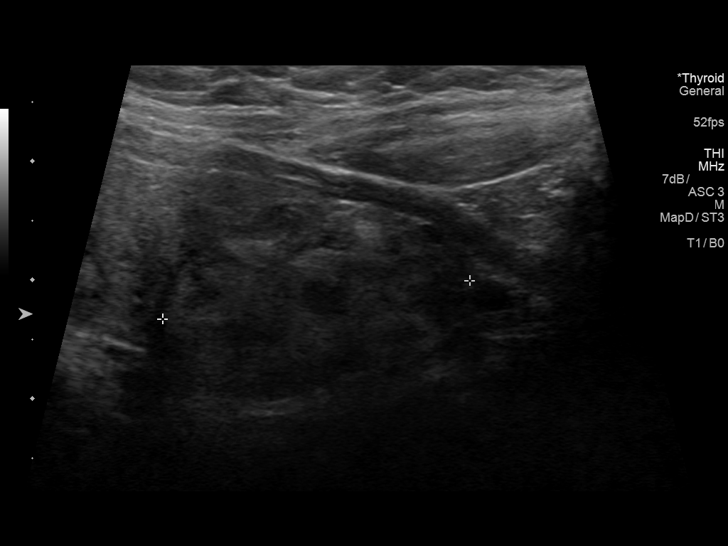
[im 47/63]
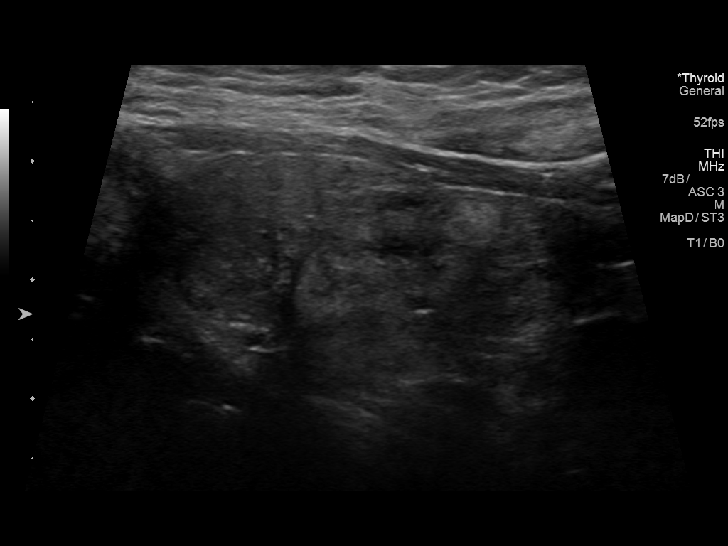
[im 52/63]
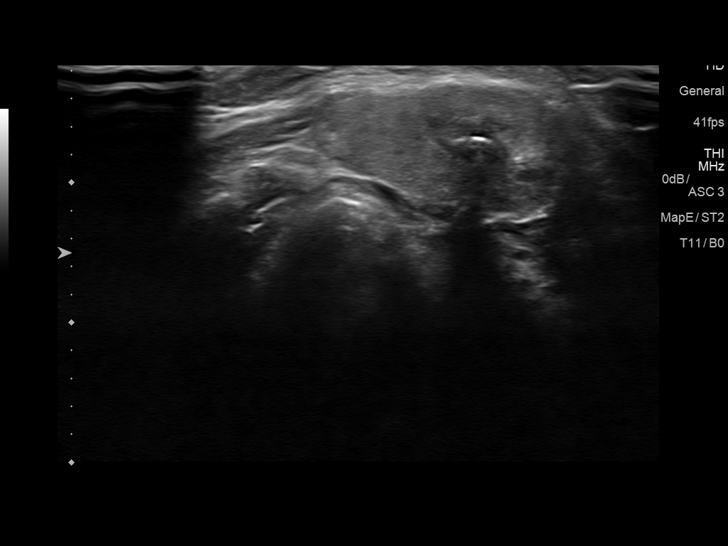
[im 57/63]
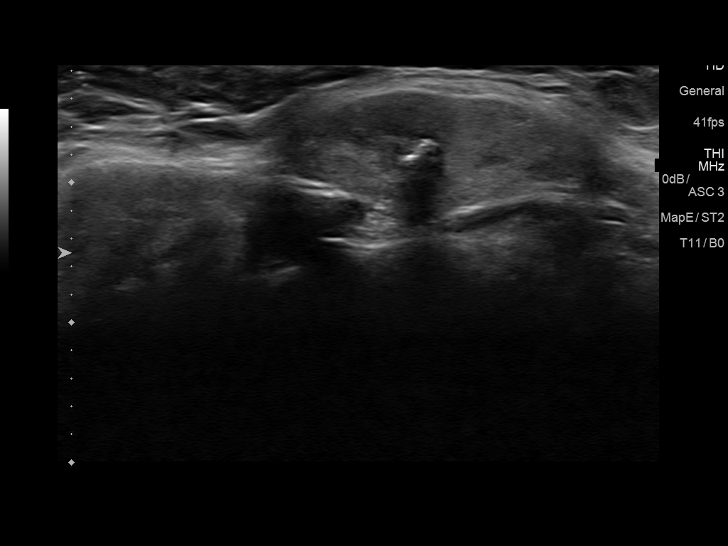
[im 63/63]
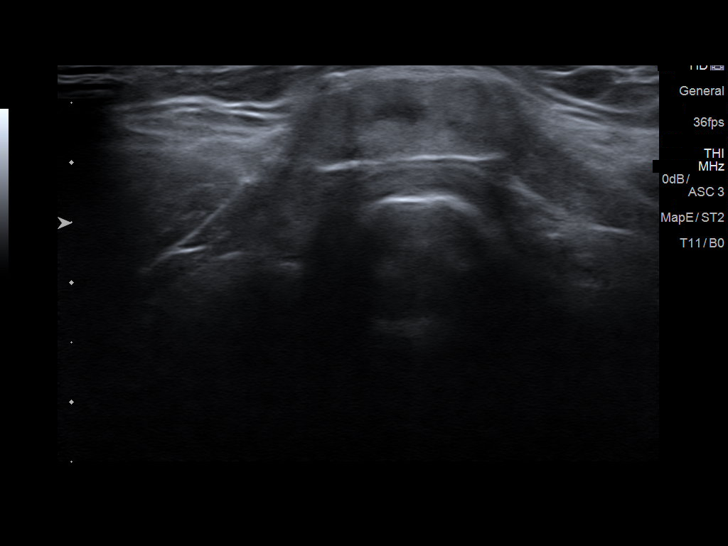

[13 of 25 positions shown; findings below may reference images not displayed]

FINDINGS: Parenchymal Echotexture: Moderately heterogenous

Isthmus: 1.1 cm

Right lobe: 3.8 x 1.0 x 1.7 cm

Left lobe: 4.5 x 1.7 x 2.1 cm

_________________________________________________________

Estimated total number of nodules >/= 1 cm: 3

Number of spongiform nodules >/=  2 cm not described below (TR1): 0

Number of mixed cystic and solid nodules >/= 1.5 cm not described
below (TR2): 0

_________________________________________________________

Diffusely heterogeneous multinodular thyroid gland. As seen
previously, there are multiple small echogenic nodules scattered
throughout the right gland which do not meet criteria for biopsy or
follow-up. Head and lobular pyramidal lobe extends superiorly from
the thyroid isthmus. No interval change.

The previously biopsied nodule in the inferior aspect of the isthmus
measures slightly smaller today at 1.5 x 1.0 x 1.4 cm compared to
1.7 x 1.3 x 1.5 cm previously.

The previously biopsied nodule in the left inferior gland is
essentially unchanged at 2.6 x 1.3 x 1.5 cm compared to 2.4 x 1.8 x
1.9 cm. No new nodule or abnormality identified.
IMPRESSION: 1. Stable multinodular goiter with a prominent lobular parental lobe
extending superiorly from the thyroid isthmus.
2. The previously biopsied nodule in the inferior isthmus measures
slightly smaller on today's examination.
3. The previously biopsied nodule in the left inferior gland is
stable.
4. Additional scattered small and benign-appearing nodules do not
meet criteria for biopsy or continued follow-up.
The above is in keeping with the ACR TI-RADS recommendations - [HOSPITAL] 1152;[DATE].

## 2018-10-14 NOTE — Telephone Encounter (Signed)
Office visit sent to [facility] on thurs

## 2018-10-15 DIAGNOSIS — D320 Benign neoplasm of cerebral meninges: Secondary | ICD-10-CM | POA: Insufficient documentation

## 2018-10-15 DIAGNOSIS — Z510 Encounter for antineoplastic radiation therapy: Secondary | ICD-10-CM | POA: Insufficient documentation

## 2018-10-16 DIAGNOSIS — M179 Osteoarthritis of knee, unspecified: Secondary | ICD-10-CM | POA: Diagnosis not present

## 2018-10-16 DIAGNOSIS — I10 Essential (primary) hypertension: Secondary | ICD-10-CM | POA: Diagnosis not present

## 2018-10-16 DIAGNOSIS — Z9181 History of falling: Secondary | ICD-10-CM | POA: Diagnosis not present

## 2018-10-17 ENCOUNTER — Encounter: Payer: Self-pay | Admitting: Radiation Oncology

## 2018-10-17 ENCOUNTER — Other Ambulatory Visit: Payer: Self-pay

## 2018-10-17 ENCOUNTER — Ambulatory Visit
Admission: RE | Admit: 2018-10-17 | Discharge: 2018-10-17 | Disposition: A | Discharge status: Home / Self Care | Payer: Medicare HMO | Source: Ambulatory Visit | Attending: Radiation Oncology | Admitting: Radiation Oncology

## 2018-10-17 VITALS — BP 156/62 | HR 65 | Temp 98.7°F | Resp 16

## 2018-10-17 DIAGNOSIS — D320 Benign neoplasm of cerebral meninges: Secondary | ICD-10-CM | POA: Diagnosis not present

## 2018-10-17 DIAGNOSIS — Z510 Encounter for antineoplastic radiation therapy: Secondary | ICD-10-CM | POA: Diagnosis not present

## 2018-10-17 DIAGNOSIS — D329 Benign neoplasm of meninges, unspecified: Secondary | ICD-10-CM

## 2018-10-17 MED ORDER — ACETAMINOPHEN 325 MG PO TABS
650.0000 mg | ORAL_TABLET | Freq: Once | ORAL | Status: AC
  Administered 2018-10-17: 15:00:00 650 mg via ORAL
  Filled 2018-10-17: qty 2

## 2018-10-17 NOTE — Progress Notes (Signed)
[name] rested with [name] for 30 minutes following her [name] treatment.  Patient denies headache, dizziness, nausea, diplopia or ringing in the ears. Denies fatigue. Patient without complaints. Understands to avoid strenuous activity for the next 24 hours and call [phone] with needs.   BP (!) 156/62 (BP Location: Right Arm)   Pulse 65   Temp 98.7 F (37.1 C) (Oral)   Resp 16   SpO2 100%    Vauda Salvucci M. Leonie Green, BSN

## 2018-10-17 NOTE — Op Note (Signed)
Stereotactic Radiosurgery Operative Note  Name: [name] MRN: [id number]  Date: 10/17/2018  DOB: Oct 14, 1950  Op Note  Pre Operative Diagnosis:   Recurrent planum sphenoidale menigioma  Post Operative Diagnois:  Recurrent planum sphenoidale menigioma  3D TREATMENT PLANNING AND DOSIMETRY:  The patient's radiation plan was reviewed and approved by myself (neurosurgery) and Dr. Kyung Rudd (radiation oncology) prior to treatment.  It showed 3-dimensional radiation distributions overlaid onto the planning CT/MRI image set.  The [facility] for the target structures as well as the organs at risk, in particular the optic nerves bilaterally, were reviewed. The documentation of the 3D plan and dosimetry are filed in the radiation oncology EMR.  NARRATIVE:  [name] was brought to the TrueBeam stereotactic radiation treatment machine and placed supine on the CT couch. The head frame was applied, and the patient was set up for stereotactic radiosurgery.  I was present for the set-up and delivery.  SIMULATION VERIFICATION:  In the couch zero-angle position, the patient underwent Exactrac imaging using the Brainlab system with orthogonal KV images.  These were carefully aligned and repeated to confirm treatment position for each of the isocenters.  The Exactrac snap film verification was repeated at each couch angle.  SPECIAL TREATMENT PROCEDURE: [name] received stereotactic radiosurgery to the following complex (due to proximity to optic nerves bilateally) target: Planum sphenoidale target was treated using 3 Rapid Arc VMAT Beams to a prescription dose of 14 Gy.  ExacTrac registration was performed for each couch angle.  The 100% isodose line was prescribed.  STEREOTACTIC TREATMENT MANAGEMENT:  Following delivery, the patient was transported to nursing in stable condition and monitored for possible acute effects.  Vital signs were recorded. The patient tolerated treatment without significant acute  effects, and was discharged to home in stable condition.    PLAN: Follow-up in one month.

## 2018-10-18 ENCOUNTER — Ambulatory Visit (INDEPENDENT_AMBULATORY_CARE_PROVIDER_SITE_OTHER): Payer: Medicare HMO | Admitting: Psychiatry

## 2018-10-18 ENCOUNTER — Other Ambulatory Visit: Payer: Self-pay | Admitting: Family Medicine

## 2018-10-18 ENCOUNTER — Telehealth: Payer: Self-pay | Admitting: Family Medicine

## 2018-10-18 DIAGNOSIS — F331 Major depressive disorder, recurrent, moderate: Secondary | ICD-10-CM | POA: Diagnosis not present

## 2018-10-18 MED ORDER — HYDROXYZINE HCL 10 MG PO TABS: ORAL_TABLET | ORAL | 0 refills | Status: DC

## 2018-10-18 NOTE — Telephone Encounter (Signed)
1 y h/o itchy rash on arms and back, started radiation treatment 1 day ago, no h/o cough or wheeze. Will send as needed hydroxyzine short term use only at bedtime

## 2018-10-18 NOTE — Progress Notes (Signed)
Virtual Visit via Telephone Note  I connected with [name] on 10/18/18 at 11:00 AM EDT by telephone and verified that I am speaking with the correct person using two identifiers.   I discussed the limitations, risks, security and privacy concerns of performing an evaluation and management service by telephone and the availability of in person appointments. I also discussed with the patient that there may be a patient responsible charge related to this service. The patient expressed understanding and agreed to proceed.  I provided 50 minutes of non-face-to-face time during this encounter.   [name], LCSW             THERAPIST PROGRESS NOTE  Session Time: Friday 10/18/2018 11:10 AM -  12:00 PM  Participatio  n Level: Active  Behavioral Response: talkative,  depressed   Type of Therapy: Individual Therapy  Treatment Goals addressed: Learn and implement behavioral strategies to overcome depression.                                                       Verbalize an understanding and resolution of current interpersonal problems.   Interventions: CBT and Supportive  Summary: [name] is a 68 y.o. female who presents with  long standing history of recurrent periods  of depression beginning when she was 67 and her favorite uncle died. Patient reports multiple psychiatric hospitlaizations due to depression and suicidal ideations with the last one occuring in 1997. Patient has participated in outpatient psychotherapy and medication management intermittently since age 33. She currently is seeing psychiatrist Dr. Harrington Challenger . Prior to this, she was seen at [facility]. Patient also has had ECT at [facility]. Symptoms have worsened in recent months due to family stress and have  included depressed mood, anxiety, excessive worry, and tearfulness.   Patient's last contact was by virtual visit via telephone  about 4 weeks ago. She reports continued stress related to medical issues. She has  continued support from medical community. She expresses frustration she does not have more support from her son, niece, and pastor. She reports having radiation treatment on brain tumor yesterday. She expresses sadness neither her son or niece accompanied to appointment. She is thankful for support from medical staff and the transportation driver. She reports sister-in-law and members of her church visited her this past Sunday. She reports having support from other friends and family but states no one seems to be around when she really needs someone to pray with her. She continues to use spirituality and is listens to church services    Suicidal/Homicidal: Nowithout intent/plan  Therapist Response: Reviewed symptoms, discussed stressors, facilitated expression of thoughts and feelings, validated feelings, assisted patient identify people in her support group, discussed and explored other options regarding spiritual support such as contacting church's prayer line related to grief and loss issues,  Plan: Return again in 2 weeks.  Diagnosis: Axis I: MDD, Recurrent, moderate    Axis II: Deferred    [name], LCSW 10/18/2018

## 2018-10-19 IMAGING — CT CT HEAD W/O CM
3 series · 14 of 46 positions shown, 16 images · non-contrast
Comparison: 10/15/2016 CT head.

CLINICAL DATA: 66 y/o F; history of planum sphenoidale meningioma
and stroke. Three months of headaches and dizziness.

EXAM:
CT HEAD WITHOUT CONTRAST
TECHNIQUE: Contiguous axial images were obtained from the base of the skull
through the vertex without intravenous contrast.

[Series 2: head trauma wo · axial · 0.47mm/px · z∈[+1058,+1178]mm · 8 of 29 slices shown, 10 images]
[im 3/29  brain]
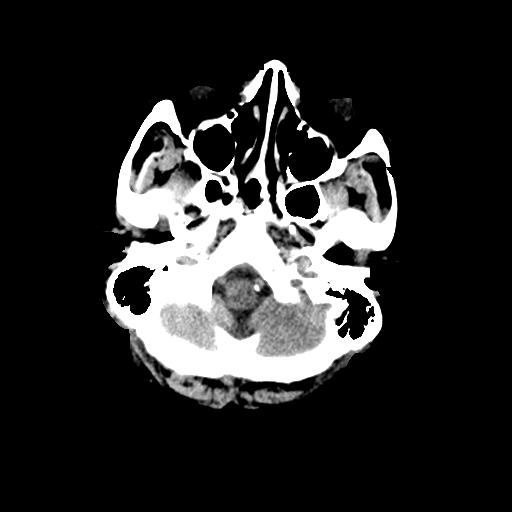
[im 3/29  bone]
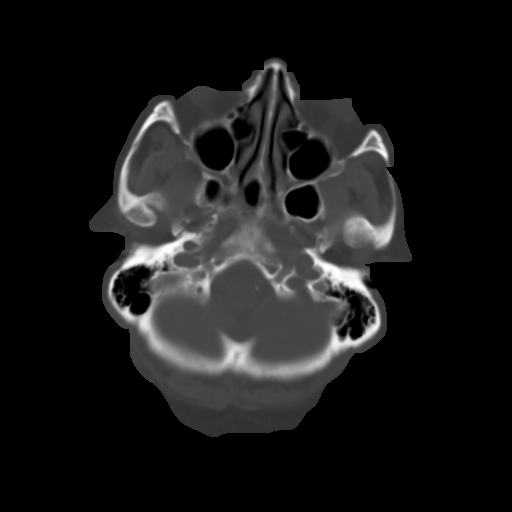
[im 7/29  brain]
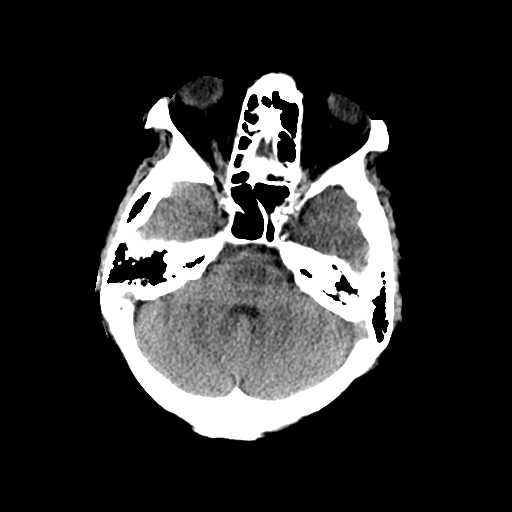
[im 10/29  brain]
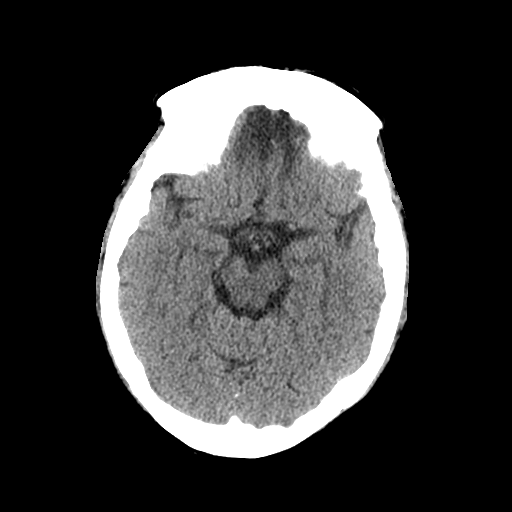
[im 13/29  brain]
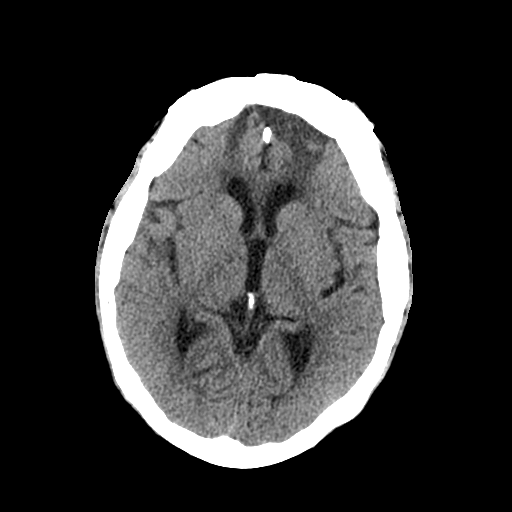
[im 17/29  brain]
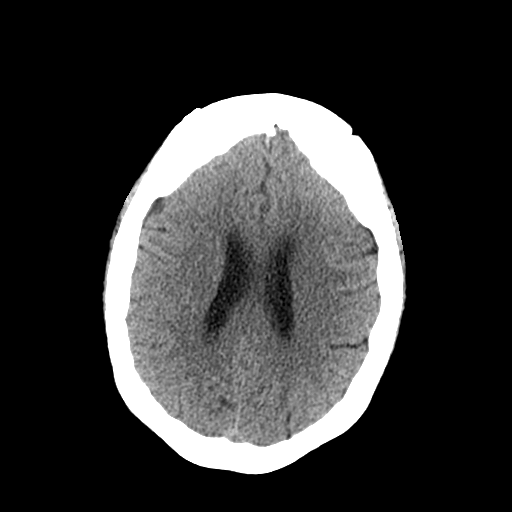
[im 17/29  bone]
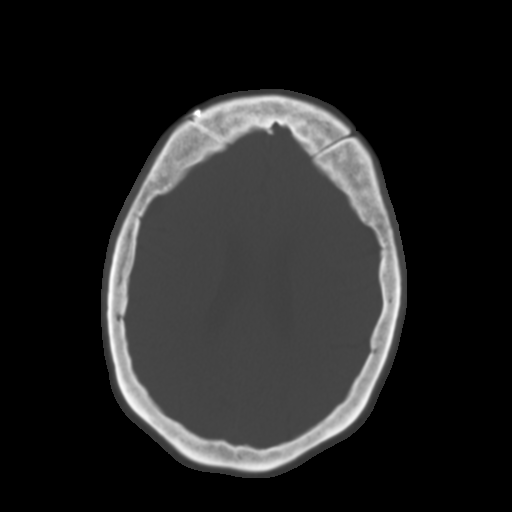
[im 20/29  brain]
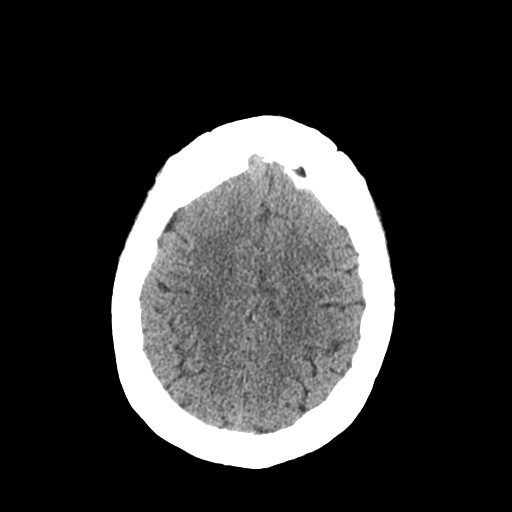
[im 23/29  brain]
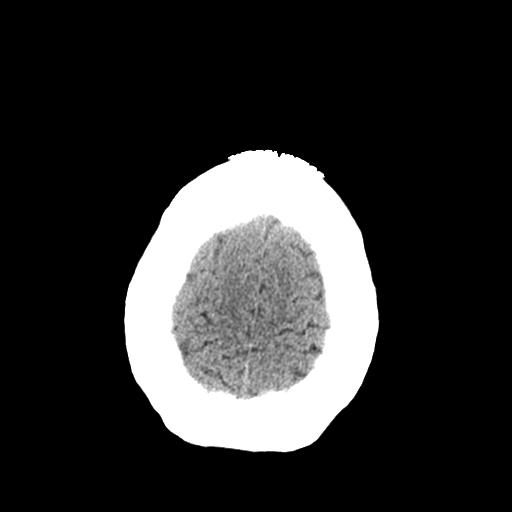
[im 27/29  brain]
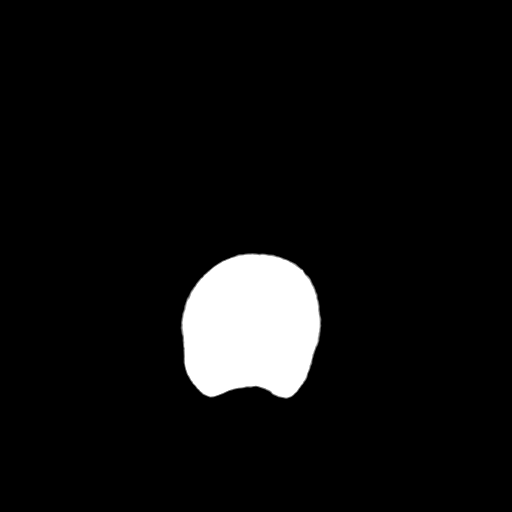

[Series 4: coronal soft tissue · coronal · 0.29mm/px · 3 of 69 slices shown]
[im 23/69  brain]
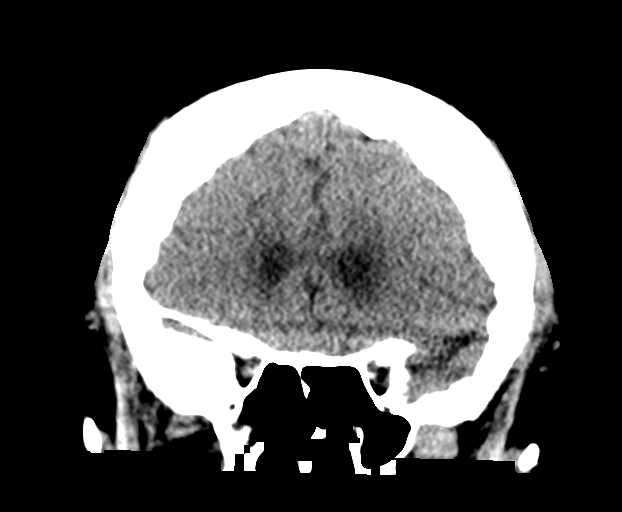
[im 31/69  brain]
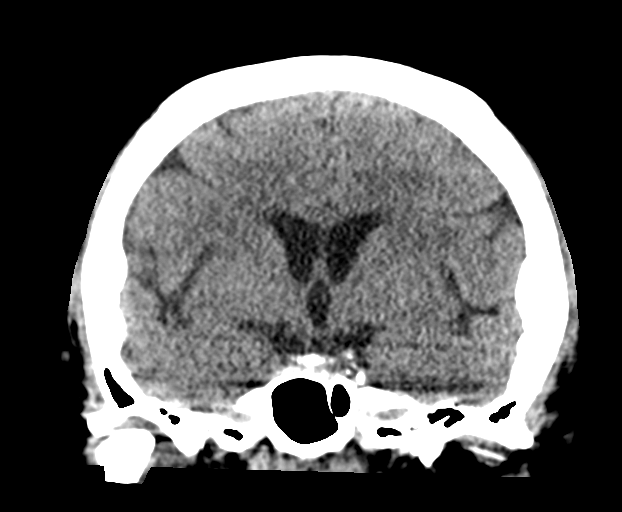
[im 38/69  brain]
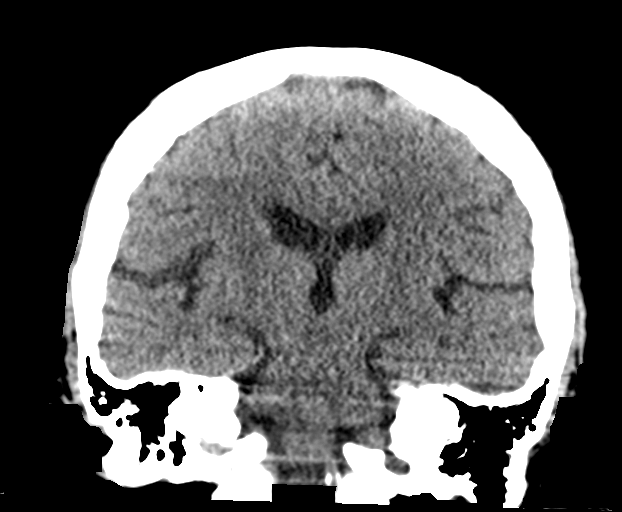

[Series 5: sagittal soft tissue · sagittal · 0.30mm/px · 3 of 54 slices shown]
[im 18/54  brain]
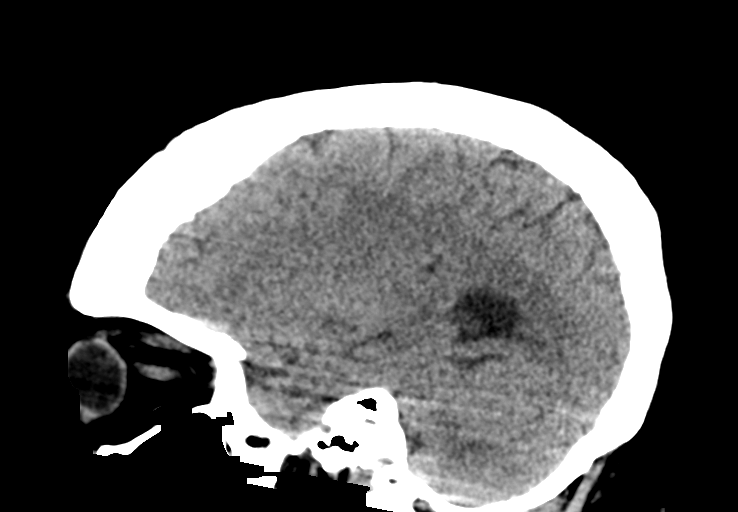
[im 27/54  brain]
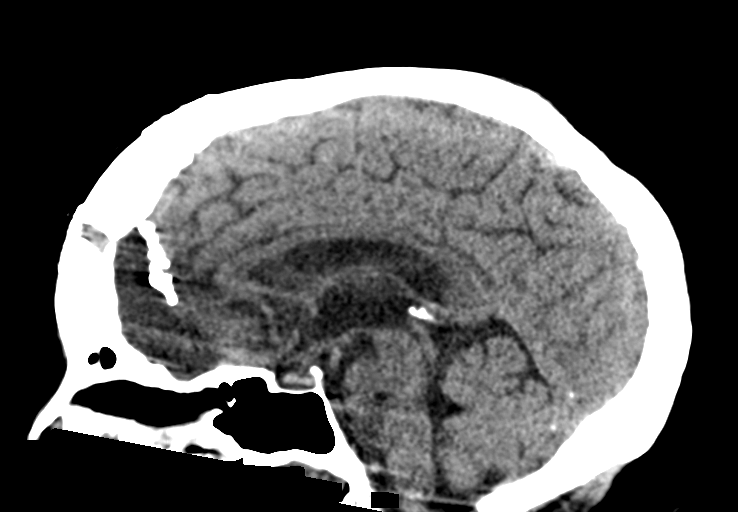
[im 36/54  brain]
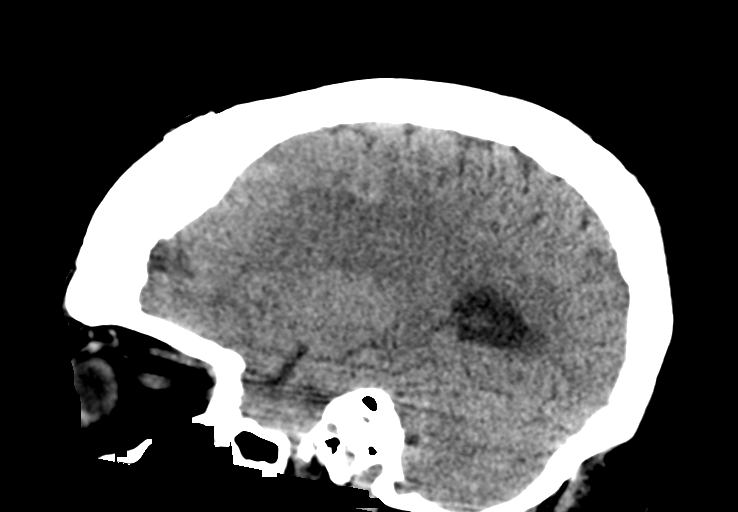

[14 of 46 positions shown; findings below may reference images not displayed]

FINDINGS: Brain: Stable anterior and inferior paramedian frontal
encephalomalacia. Grossly stable soft tissue in region of planum
sphenoidale meningioma, partially obscured by streak artifact
(series 5, image 27). No large acute stroke, hemorrhage, or focal
mass effect identified. No herniation or effacement of basilar
cisterns.

Vascular: Calcific atherosclerosis of carotid siphons. No hyperdense
vessel.

Skull: Stable chronic postsurgical changes related to bifrontal
craniotomy.

Sinuses/Orbits: No acute finding. Bilateral intra-ocular lens
replacement.

Other: None.
IMPRESSION: 1. No acute intracranial abnormality identified. Stable CT of the
head.
2. Grossly stable soft tissue in region of planum sphenoidale
meningioma, partially obscured by streak artifact.
3. Stable postsurgical changes related to bifrontal craniotomy and
stable anterior frontal lobe encephalomalacia.

By: Bany Kobayashi M.D.

## 2018-10-21 DIAGNOSIS — I69354 Hemiplegia and hemiparesis following cerebral infarction affecting left non-dominant side: Secondary | ICD-10-CM | POA: Diagnosis not present

## 2018-10-21 DIAGNOSIS — I110 Hypertensive heart disease with heart failure: Secondary | ICD-10-CM | POA: Diagnosis not present

## 2018-10-21 DIAGNOSIS — J449 Chronic obstructive pulmonary disease, unspecified: Secondary | ICD-10-CM | POA: Diagnosis not present

## 2018-10-21 DIAGNOSIS — M7542 Impingement syndrome of left shoulder: Secondary | ICD-10-CM | POA: Diagnosis not present

## 2018-10-21 DIAGNOSIS — M5030 Other cervical disc degeneration, unspecified cervical region: Secondary | ICD-10-CM | POA: Diagnosis not present

## 2018-10-21 DIAGNOSIS — E1142 Type 2 diabetes mellitus with diabetic polyneuropathy: Secondary | ICD-10-CM | POA: Diagnosis not present

## 2018-10-21 DIAGNOSIS — D320 Benign neoplasm of cerebral meninges: Secondary | ICD-10-CM | POA: Diagnosis not present

## 2018-10-21 DIAGNOSIS — M159 Polyosteoarthritis, unspecified: Secondary | ICD-10-CM | POA: Diagnosis not present

## 2018-10-21 DIAGNOSIS — Z794 Long term (current) use of insulin: Secondary | ICD-10-CM | POA: Diagnosis not present

## 2018-10-24 ENCOUNTER — Other Ambulatory Visit: Payer: Self-pay

## 2018-10-24 NOTE — Patient Outreach (Signed)
[place] [facility]) Care Management  10/24/2018  [name] 08-Apr-1950 [id number]    1st attempt to outreach the patient.  No answer.  HIPAA compliant voicemail left with contact information.  Plan: RN Health Coach will send letter.  RN Health Coach will make an outreach attempt to the patient within four business days.   [name] RN, BSN, [place] Direct Dial:  [phone]  Fax: [phone]

## 2018-10-25 ENCOUNTER — Other Ambulatory Visit: Payer: Self-pay

## 2018-10-25 NOTE — Patient Outreach (Signed)
[facility]) Care Management  10/25/2018   [name] 12-17-1950 [id number]  Subjective: Successful call to the patient.  Two patient identifiers given. The patient is doing fair.  She states that she has a headache, glaze over her left eye and rates it about a 6/10 after taking her medication.  She states that she has had one treatment of radiation for the tumor in her brain.  She was told that she could get headaches. She states that she has not been checking her blood pressure.  She checked while on the phone it was 146/61.  Her blood sugar this morning was 121. Encouraged her to check her blood pressures daily and continue checking her blood sugars.  She verbalized understanding.  She states that she has a new aid that comes to the home for 8 hours seven days a week.  She states she has a hospital bed and it really helps. She ha a Radiation protection practitioner and is waiting for her lift chair.  She states she has appointments coming up for the cancer center and at [facility].  She could not tell me the dates.    Current Medications:  Current Outpatient Medications  Medication Sig Dispense Refill  . ACCU-CHEK FASTCLIX LANCETS MISC Use to check blood sugar 4 times a day 306 each 0  . albuterol (VENTOLIN HFA) 108 (90 Base) MCG/ACT inhaler Inhale 1-2 puffs every 6 hours as needed for wheezing, shortness of breath 3 Inhaler 3  . ascorbic acid (VITAMIN C) 500 MG tablet Take 500 mg by mouth daily.    [name] aspirin EC 81 MG tablet Take 81 mg by mouth daily.    . Blood Glucose Monitoring Suppl (ACCU-CHEK AVIVA) device Use as instructed 1 each 0  . cetirizine (ZYRTEC) 10 MG tablet Take 1 tablet (10 mg total) by mouth daily. 7 tablet 0  . Cholecalciferol (VITAMIN D3) 5000 units CAPS Take 1 capsule by mouth daily.    . clobetasol cream (TEMOVATE) 0.05 %     . clotrimazole-betamethasone (LOTRISONE) cream Apply sparingly twice daily to rash on face for 1 week, then only as needed 45 g 0  . diclofenac  sodium (VOLTAREN) 1 % GEL Apply 2 g topically 4 (four) times daily. 1 Tube 3  . DROPLET PEN NEEDLES 32G X 4 MM MISC USE  TO INJECT FOUR TIMES DAILY 400 each 0  . glucose blood (ACCU-CHEK AVIVA) test strip Use to check blood sugar 4 times a day 400 each 2  . hydrOXYzine (ATARAX/VISTARIL) 10 MG tablet Take one tablet by mouth, at bedtime, as needed, for itch 15 tablet 0  . insulin aspart (NOVOLOG FLEXPEN) 100 UNIT/ML FlexPen INJECT 10 TO 14 UNITS INTO THE SKIN 3 (THREE) TIMES DAILY WITH MEALS. 45 mL 3  . Insulin Glargine (TOUJEO SOLOSTAR) 300 UNIT/ML SOPN Inject 20 Units into the skin at bedtime. 9 pen 3  . lamoTRIgine (LAMICTAL) 100 MG tablet Take 1 tablet (100 mg total) by mouth 2 (two) times daily. 180 tablet 2  . levothyroxine (SYNTHROID) 50 MCG tablet TAKE 1 TABLET  DAILY  EXCEPT TAKE 1/2 TABLET  ON  SUNDAY 85 tablet 1  . LORazepam (ATIVAN) 0.5 MG tablet Take 1 tablet (0.5 mg total) by mouth 2 (two) times daily. 180 tablet 2  . losartan (COZAAR) 50 MG tablet TAKE 1 TABLET EVERY DAY 90 tablet 1  . metaxalone (SKELAXIN) 800 MG tablet metaxalone 800 mg tablet  take 1 tablet by mouth 3 times daily as  needed.    . metoprolol tartrate (LOPRESSOR) 50 MG tablet TAKE 1 TABLET TWICE DAILY (NEED MD APPOINTMENT) 180 tablet 2  . montelukast (SINGULAIR) 10 MG tablet TAKE 1 TABLET EVERY DAY 90 tablet 1  . Multiple Vitamins-Minerals (HM MULTIVITAMIN ADULT GUMMY PO) Take 1 tablet by mouth daily.    [name] nystatin (MYCOSTATIN/NYSTOP) powder Apply to affected areas four times daily as needed. 30 g 2  . ondansetron (ZOFRAN) 4 MG tablet Take 1 tablet by mouth every 6 hours as needed for nausea. 50 tablet 1  . oxyCODONE-acetaminophen (PERCOCET) 10-325 MG tablet     . pregabalin (LYRICA) 75 MG capsule Take 1 capsule (75 mg total) by mouth daily.    . RABEprazole (ACIPHEX) 20 MG tablet Take 1 tablet (20 mg total) by mouth 2 (two) times daily. 180 tablet 3  . RESTASIS 0.05 % ophthalmic emulsion Place 1 drop into both eyes  2 (two) times daily.     . risperiDONE (RISPERDAL) 0.5 MG tablet Take 1 tablet (0.5 mg total) by mouth at bedtime. 90 tablet 2  . rosuvastatin (CRESTOR) 5 MG tablet TAKE 1 TABLET AT BEDTIME 90 tablet 1  . sertraline (ZOLOFT) 100 MG tablet Take 1 tablet (100 mg total) by mouth daily. 90 tablet 2  . Tiotropium Bromide-Olodaterol (STIOLTO RESPIMAT) 2.5-2.5 MCG/ACT AERS Inhale 2 puffs into the lungs daily. 12 g 3  . TOUJEO SOLOSTAR 300 UNIT/ML SOPN INJECT 20 UNITS INTO THE SKIN AT BEDTIME. 6 pen 2  . traZODone (DESYREL) 100 MG tablet Take 1 tablet (100 mg total) by mouth at bedtime. 30 tablet 2  . triamcinolone ointment (KENALOG) 0.1 % Apply 1 application topically at bedtime. Apply to elbows at bedtime.    [name] umeclidinium-vilanterol (ANORO ELLIPTA) 62.5-25 MCG/INH AEPB Inhale 1 puff into the lungs daily. 1 each 5  . UNABLE TO FIND Rolling walker with seat x 1  DX. Bilateral lower extremity weakness, frequent falls, instability 1 each 0  . UNABLE TO FIND Lift chair x 1  DX M47.16, R29.6 1 each 0  . amLODipine (NORVASC) 10 MG tablet Take 1 tablet (10 mg total) by mouth daily. 90 tablet 3  . UNABLE TO FIND Transport Chair x 1  Dx-bilateral leg weakness, frequent falls (Patient not taking: Reported on 10/25/2018) 1 each 0   No current facility-administered medications for this visit.     Functional Status:  In your present state of health, do you have any difficulty performing the following activities: 06/12/2018  Hearing? N  Vision? N  Difficulty concentrating or making decisions? Y  Walking or climbing stairs? Y  Dressing or bathing? N  Doing errands, shopping? Y  Preparing Food and eating ? N  Using the Toilet? N  Comment high toilet seat  In the past six months, have you accidently leaked urine? Y  Comment had an accident yesterday morning/ wears pull ups: when out or at bedtime  Do you have problems with loss of bowel control? N  Managing your Medications? Y  Comment has an aide do this   Managing your Finances? Y  Comment niece helps with her bills   Housekeeping or managing your Housekeeping? Y  Comment aide helps with some of this  Some recent data might be hidden    Fall/Depression Screening: Fall Risk  10/25/2018 10/10/2018 10/03/2018  Falls in the past year? 0 1 1  Number falls in past yr: - 1 0  Comment - - -  Injury with Fall? - 0 0  Comment - - -  Risk Factor Category  - - -  Risk for fall due to : - - History of fall(s);Impaired mobility  Follow up - - Education provided   [facility] 2/9 Scores 10/10/2018 09/30/2018 09/04/2018 06/12/2018 04/17/2018 01/31/2018 11/20/2017  PHQ - 2 Score 2 3 0 2 0 1 3  PHQ- 9 Score 6 8 - 16 - - 6  Some encounter information is confidential and restricted. Go to Review Flowsheets activity to see all data.    Assessment: Patient will continue to benefit from health coach outreach for disease management and support. THN CM Care Plan Problem One     Most Recent Value  THN Long Term Goal   In 60 days the patient will verbalize maintaining her blood pressure and no hospital visits  THN Long Term Goal Start Date  10/25/18  Interventions for Problem One Long Term Goal  Encouraged the patient to check her blood pressures daily, continue to check her blood sugars daily, encouraged her to take her medications as prescribed, encouraged her to use her walker when ambulating in thehome.     Plan: RN Health Coach will contact patient in the month of October and patient agrees to next outreach.    [name] RN, BSN, [place] Direct Dial:  [phone]  Fax: [phone]

## 2018-10-27 IMAGING — MR MR HEAD W/O CM
8 of 10 series · 40 of 48 positions shown · non-contrast
Comparison: CT head without contrast 03/26/17.  MRI brain 02/17/2016

CLINICAL DATA: Left-sided weakness for 1 day. Personal history of
meningioma resection.

EXAM:
MRI HEAD WITHOUT CONTRAST
TECHNIQUE: Multiplanar, multiecho pulse sequences of the brain and surrounding
structures were obtained without intravenous contrast.

[Series 4: DWI · axial · 3.0mm · 0.82mm/px · z∈[-116,+27]mm · 6 of 49 slices shown (1 of 4)]
[im 1/49]
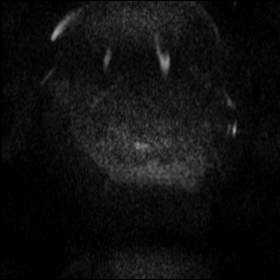
[im 10/49]
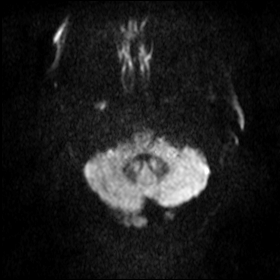
[im 20/49]
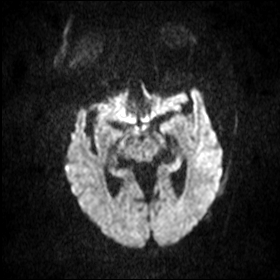
[im 29/49]
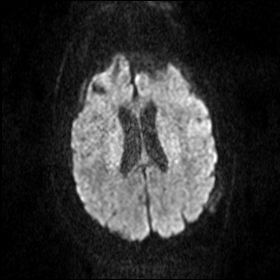
[im 39/49]
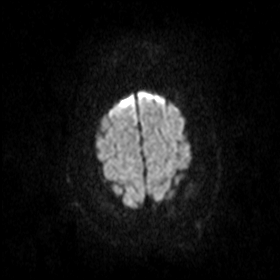
[im 49/49]
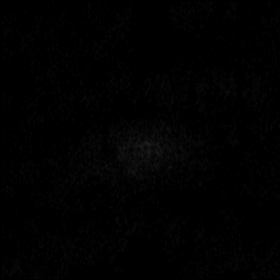

[Series 5: DWI · axial · 3.0mm · 0.82mm/px · z∈[-122,+27]mm · 6 of 51 slices shown (2 of 4)]
[im 1/51]
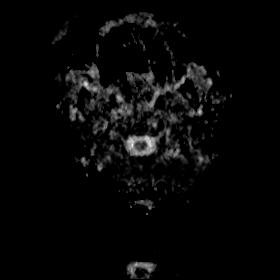
[im 11/51]
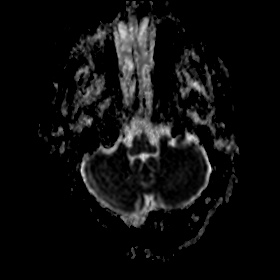
[im 21/51]
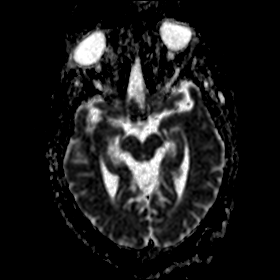
[im 31/51]
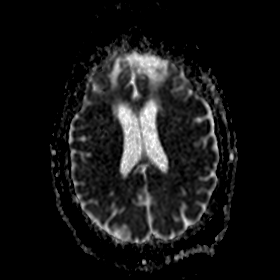
[im 41/51]
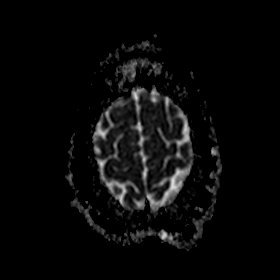
[im 51/51]
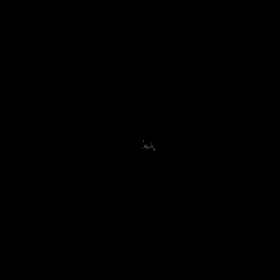

[Series 6: DWI · coronal · 5.0mm · 0.60mm/px · 4 of 32 slices shown (3 of 4)]
[im 1/32]
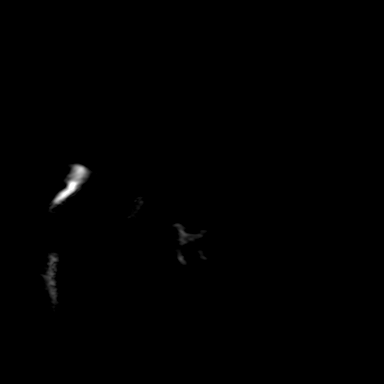
[im 11/32]
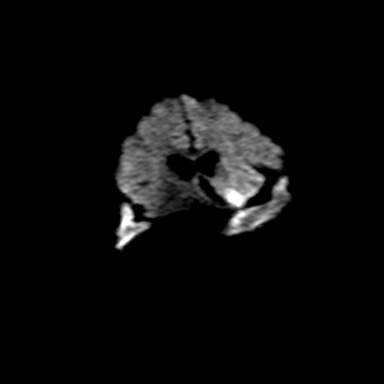
[im 21/32]
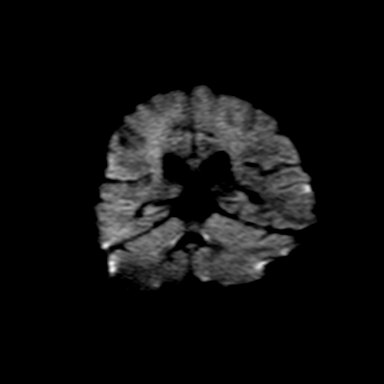
[im 32/32]
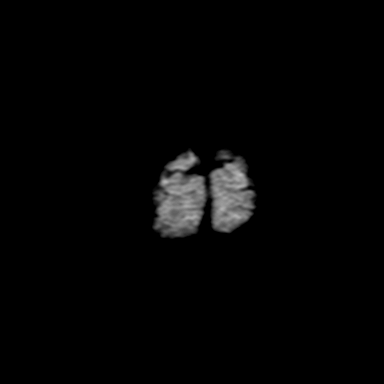

[Series 7: DWI · coronal · 5.0mm · 0.60mm/px · 4 of 32 slices shown (4 of 4)]
[im 1/32]
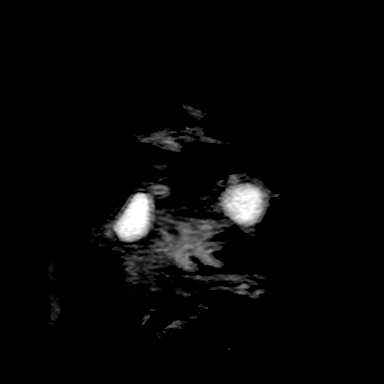
[im 11/32]
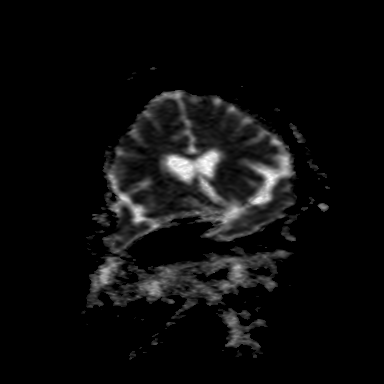
[im 21/32]
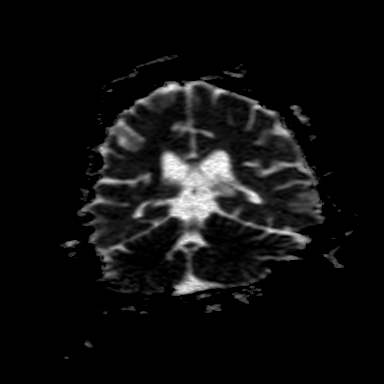
[im 32/32]
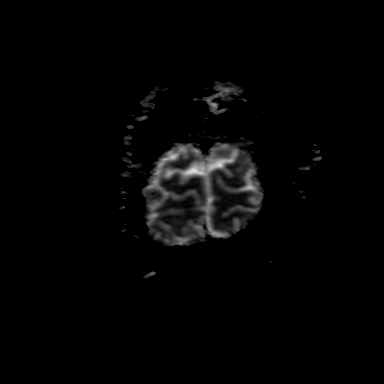

[Series 8: T2 · axial · 5.0mm · 0.75mm/px · z∈[-120,+23]mm · 3 of 23 slices shown (1 of 2)]
[im 1/23]
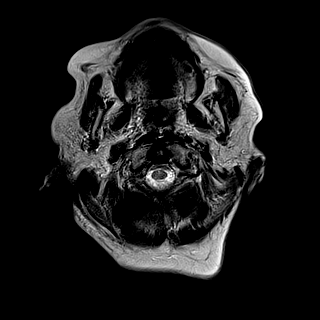
[im 12/23]
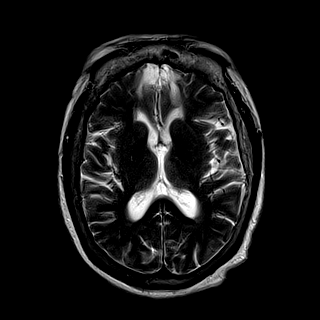
[im 23/23]
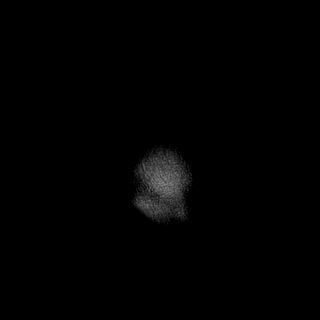

[Series 9: FLAIR · axial · 3.0mm · 0.94mm/px · z∈[-117,+20]mm · 6 of 47 slices shown]
[im 1/47]
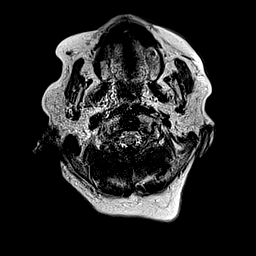
[im 10/47]
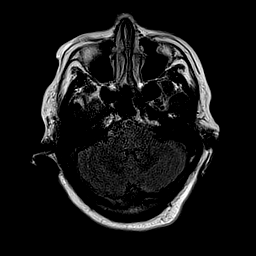
[im 19/47]
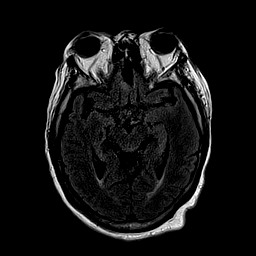
[im 28/47]
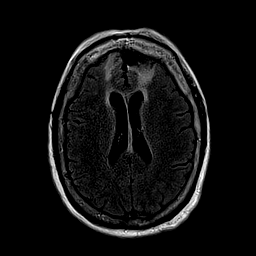
[im 37/47]
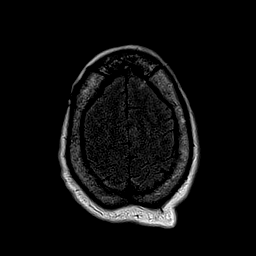
[im 47/47]
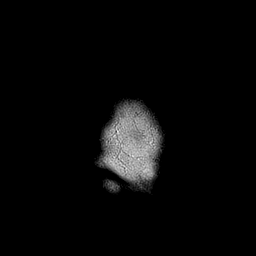

[Series 10: T1 · axial · 2.0mm · 0.47mm/px · z∈[-131,+56]mm · 8 of 95 slices shown]
[im 1/95]
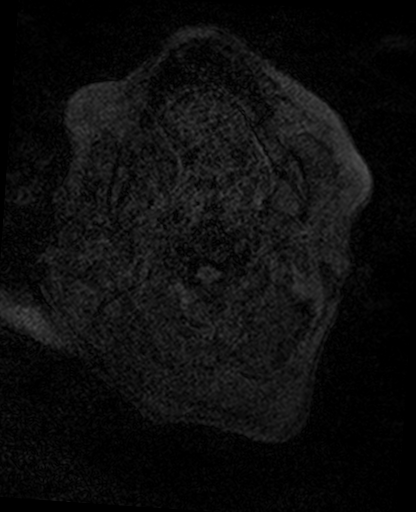
[im 19/95]
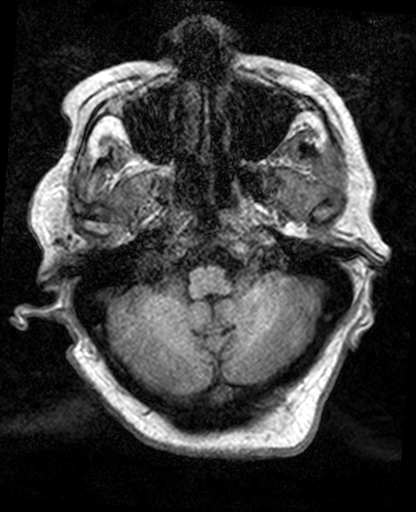
[im 29/95]
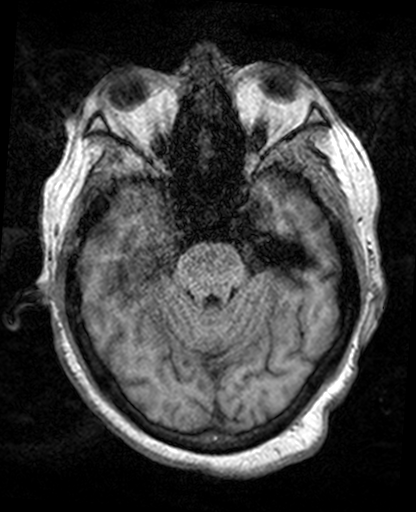
[im 38/95]
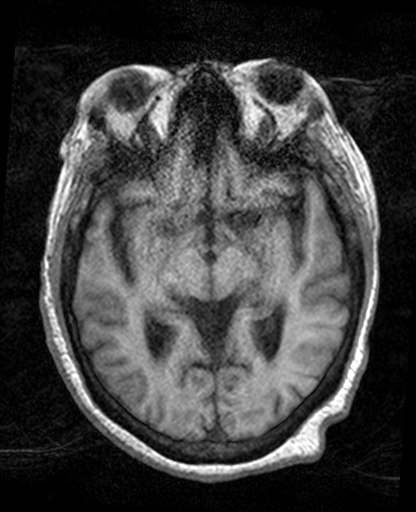
[im 57/95]
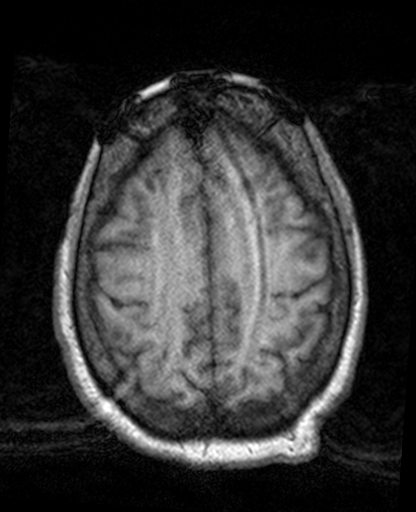
[im 66/95]
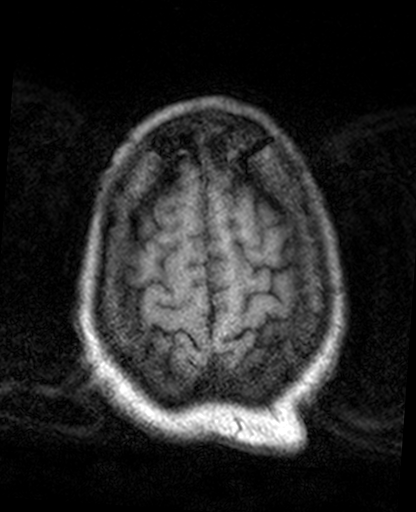
[im 76/95]
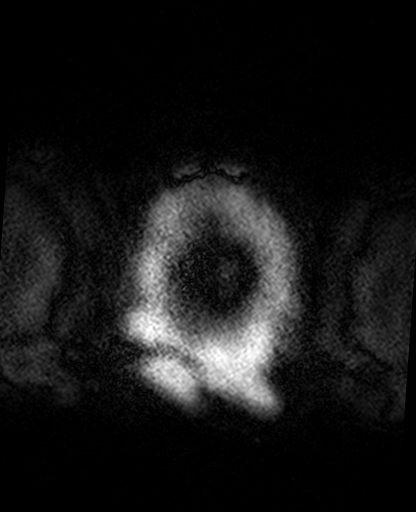
[im 95/95]
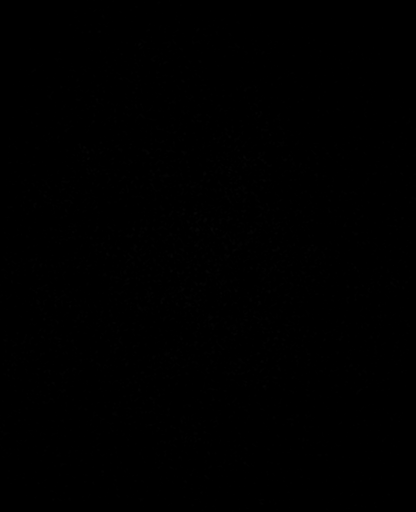

[Series 12: T2 · coronal · 5.0mm · 0.62mm/px · 3 of 28 slices shown (2 of 2)]
[im 1/28]
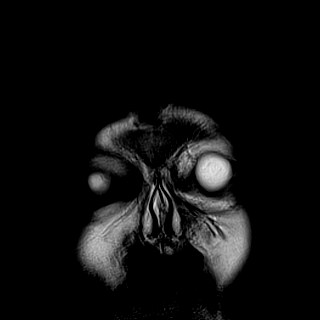
[im 14/28]
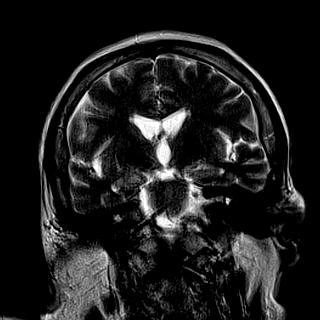
[im 28/28]
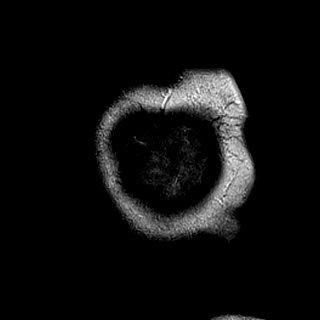

[40 of 48 positions shown; findings below may reference images not displayed]

FINDINGS: Brain: Bifrontal craniotomy again noted. Diffusion-weighted images
demonstrate no acute or subacute infarction. Bifrontal
encephalomalacia is stable.

Acute infarct, hemorrhage, new mass lesion is present. The residual
planum sphenoidale meningioma better appreciated with contrast on
the prior study. No significant mass effect is present. No
significant extra-axial fluid collections are present.

Moderate diffuse white matter changes are present bilaterally in the
pons.

Vascular: Normal flow voids.

Skull and upper cervical spine: The craniocervical junction is
normal. The visualized intracranial contents are unremarkable.

Sinuses/Orbits: The paranasal sinuses and mastoid air cells are
clear. Globes and orbits are within normal limits. Bilateral lens
replacements are noted.
IMPRESSION: 1. Stable appearance of bifrontal encephalomalacia following
craniotomy for resection of meningioma.
2. Small residual or recurrent planum sphenoidale meningioma.
3. No acute intracranial abnormality.

## 2018-10-28 DIAGNOSIS — G4733 Obstructive sleep apnea (adult) (pediatric): Secondary | ICD-10-CM | POA: Diagnosis not present

## 2018-10-28 DIAGNOSIS — E785 Hyperlipidemia, unspecified: Secondary | ICD-10-CM | POA: Diagnosis not present

## 2018-10-31 DIAGNOSIS — M6281 Muscle weakness (generalized): Secondary | ICD-10-CM | POA: Diagnosis not present

## 2018-10-31 DIAGNOSIS — E119 Type 2 diabetes mellitus without complications: Secondary | ICD-10-CM | POA: Diagnosis not present

## 2018-10-31 DIAGNOSIS — I69359 Hemiplegia and hemiparesis following cerebral infarction affecting unspecified side: Secondary | ICD-10-CM | POA: Diagnosis not present

## 2018-10-31 DIAGNOSIS — D320 Benign neoplasm of cerebral meninges: Secondary | ICD-10-CM | POA: Insufficient documentation

## 2018-10-31 DIAGNOSIS — Z9181 History of falling: Secondary | ICD-10-CM | POA: Diagnosis not present

## 2018-10-31 NOTE — Progress Notes (Signed)
  Radiation Oncology         [phone] ________________________________  Name: [name] MRN: [id number]  Date: 10/10/2018  DOB: 21-Jan-1951  DIAGNOSIS:     ICD-10-CM   1. Meningioma (HCC)  D32.9 sodium chloride flush (NS) 0.9 % injection 10 mL  2. Benign meningioma of brain ([place])  D32.0     NARRATIVE:  The patient was brought to the [place].  Identity was confirmed.  All relevant records and images related to the planned course of therapy were reviewed.  The patient freely provided informed written consent to proceed with treatment after reviewing the details related to the planned course of therapy. The consent form was witnessed and verified by the simulation staff. Intravenous access was established for contrast administration. Then, the patient was set-up in a stable reproducible supine position for radiation therapy.  A relocatable thermoplastic stereotactic head frame was fabricated for precise immobilization.  CT images were obtained.  Surface markings were placed.  The CT images were loaded into the planning software and fused with the patient's targeting MRI scan.  Then the target and avoidance structures were contoured.  Treatment planning then occurred.  The radiation prescription was entered and confirmed.  I have requested 3D planning  I have requested a DVH of the following structures: Brain stem, brain, left eye, right eye, lenses, optic chiasm, target volumes, uninvolved brain, and normal tissue.    SPECIAL TREATMENT PROCEDURE:  The planned course of therapy using radiation constitutes a special treatment procedure. Special care is required in the management of this patient for the following reasons. This treatment constitutes a Special Treatment Procedure for the following reason: High dose per fraction requiring special monitoring for increased toxicities of treatment including daily imaging.  The special nature of the planned course of radiotherapy will  require increased physician supervision and oversight to ensure patient's safety with optimal treatment outcomes.  PLAN:  The patient will receive 14 Gy in 1 fraction.   ------------------------------------------------  Jodelle Gross, MD, PhD

## 2018-11-03 IMAGING — CT CT HEAD W/O CM
5 of 12 series · 15 of 47 positions shown, 17 images · non-contrast
Comparison: 03/26/2017 CT.  MRI 04/03/2017.

CLINICAL DATA: Fall in bathroom, hit head.

EXAM:
CT HEAD WITHOUT CONTRAST
CT CERVICAL SPINE WITHOUT CONTRAST
TECHNIQUE: Multidetector CT imaging of the head and cervical spine was
performed following the standard protocol without intravenous
contrast. Multiplanar CT image reconstructions of the cervical spine
were also generated.

[Series 5: head bone · axial · 0.45mm/px · z∈[+19,+121]mm · 5 of 77 slices shown, 7 images]
[im 13/77  brain]
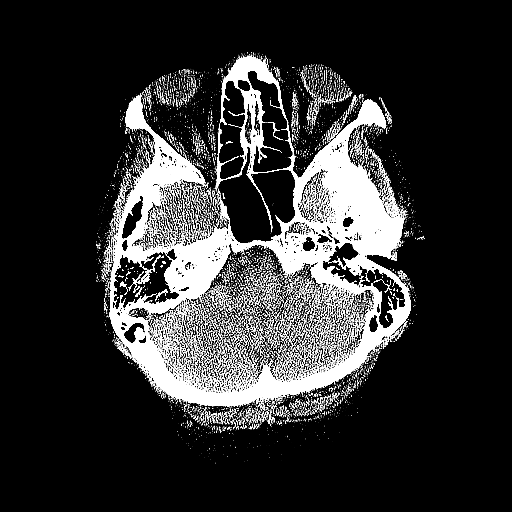
[im 13/77  bone]
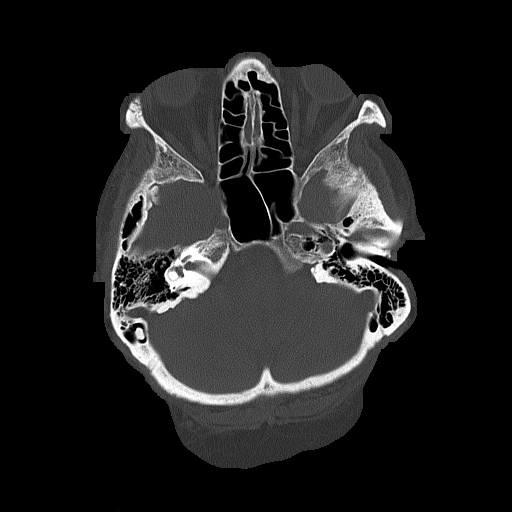
[im 26/77  brain]
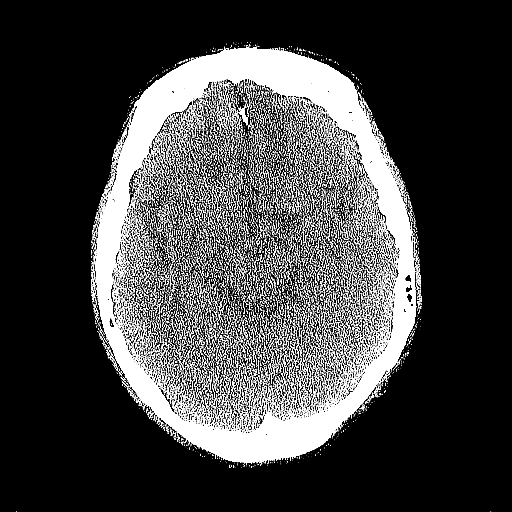
[im 39/77  brain]
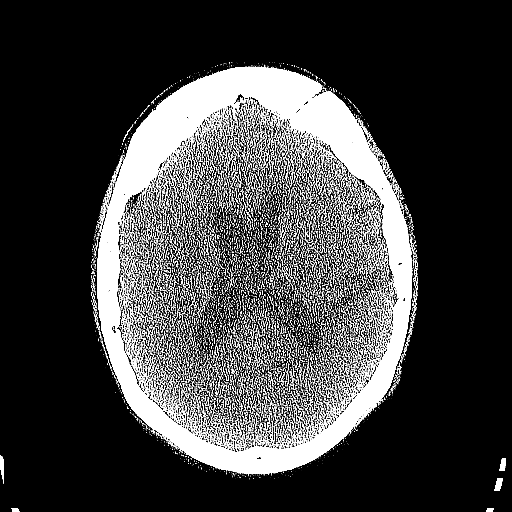
[im 51/77  brain]
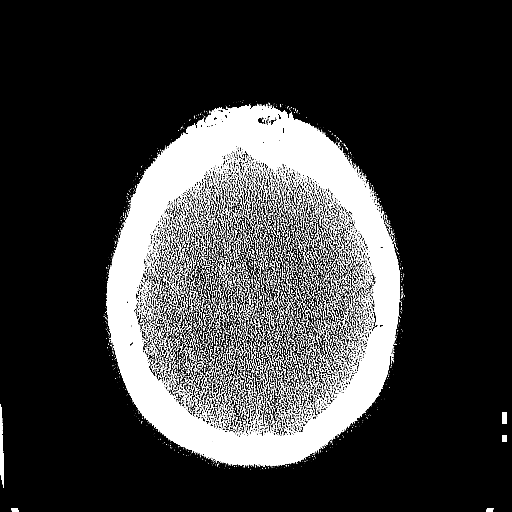
[im 64/77  brain]
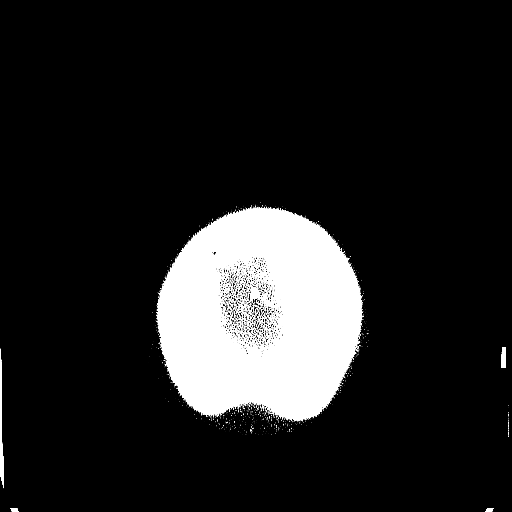
[im 64/77  bone]
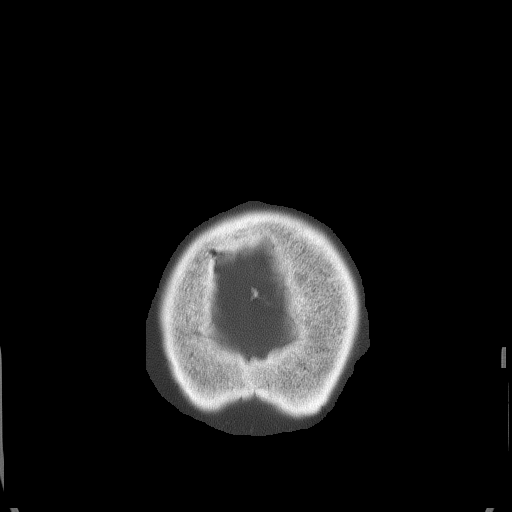

[Series 6: coronal soft tissue · coronal · 0.30mm/px · 2 of 66 slices shown]
[im 22/66  brain]
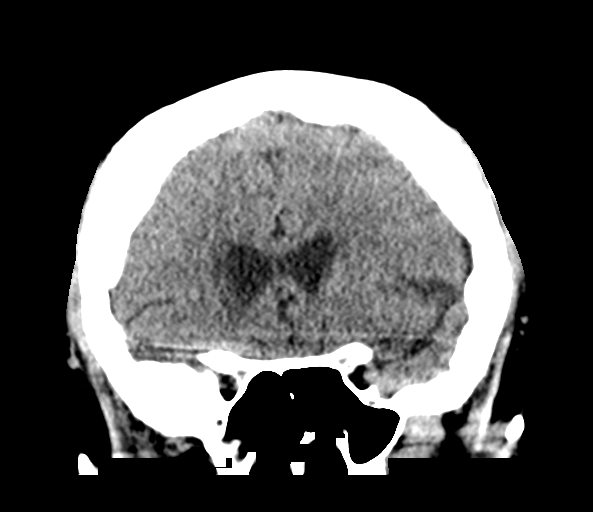
[im 44/66  brain]
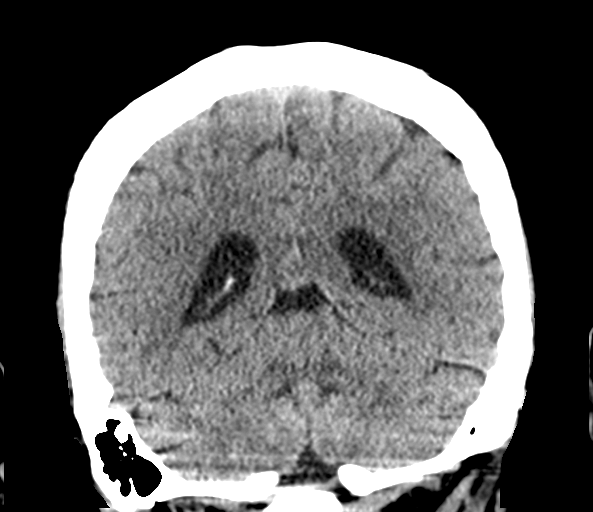

[Series 12: sagittal bone · sagittal · 0.21mm/px · 1 of 61 slices shown]
[im 31/61  brain]
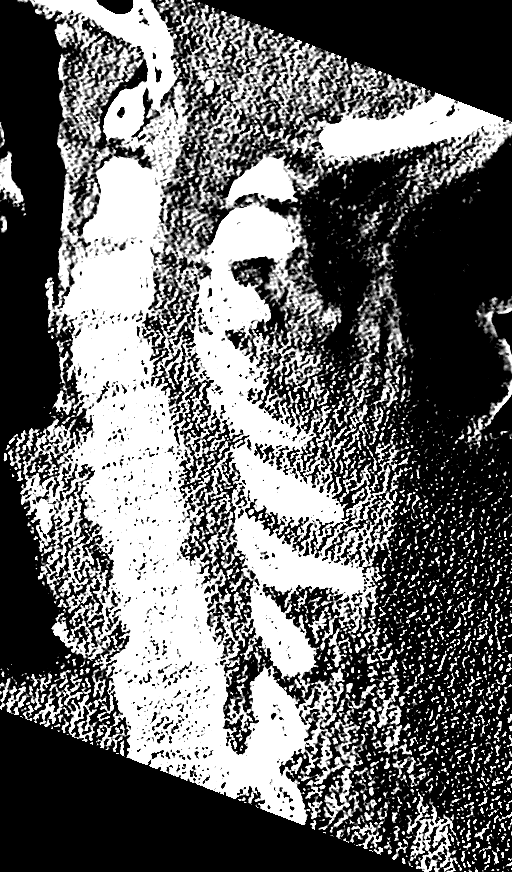

[Series 15: orthogonal bone · axial · 0.21mm/px · z∈[-135,-40]mm · 5 of 87 slices shown (1 of 2)]
[im 15/87  bone]
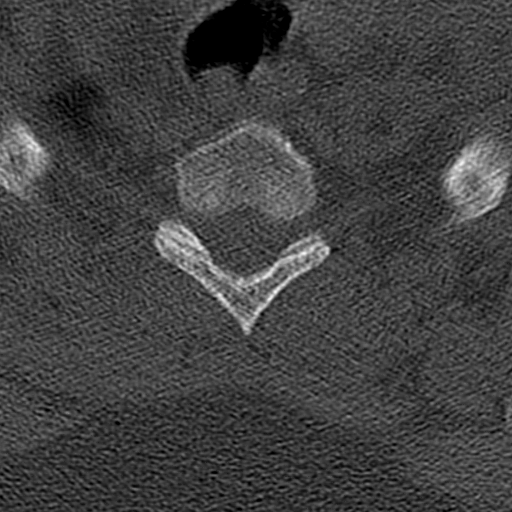
[im 29/87  bone]
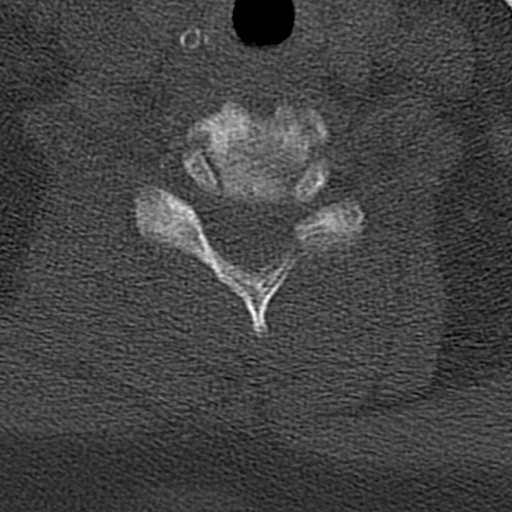
[im 44/87  bone]
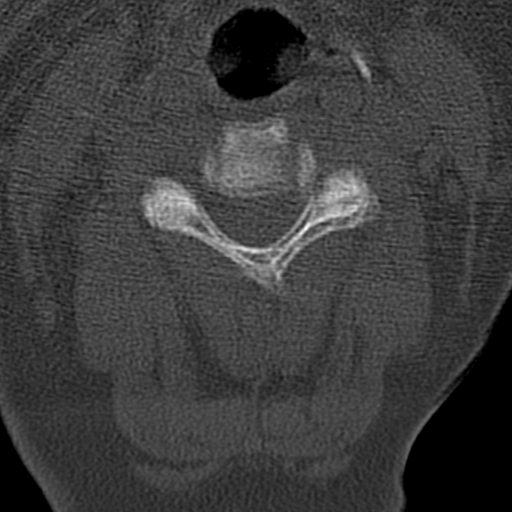
[im 58/87  bone]
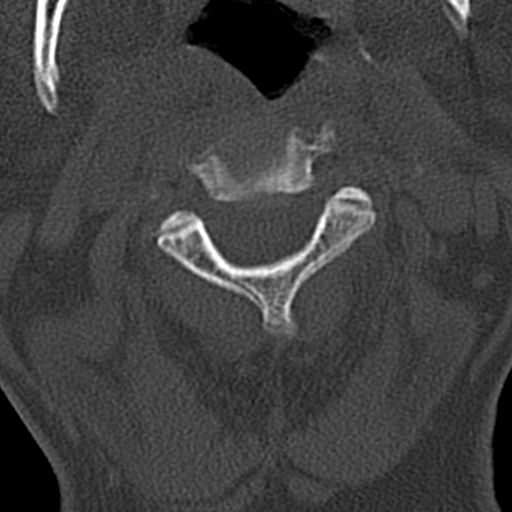
[im 72/87  bone]
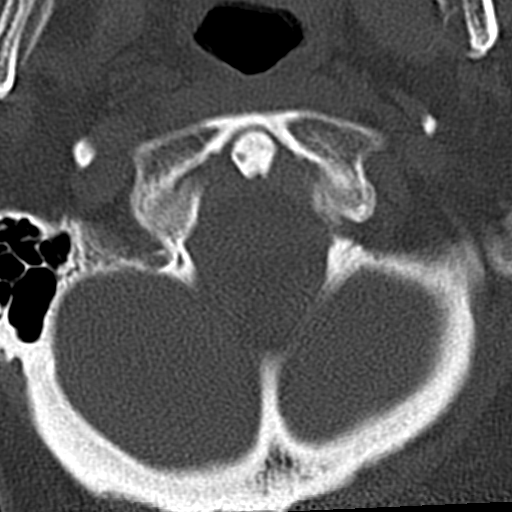

[Series 19: orthogonal bone · axial · 0.21mm/px · z∈[-99,-68]mm · 2 of 44 slices shown (2 of 2)]
[im 15/44  bone]
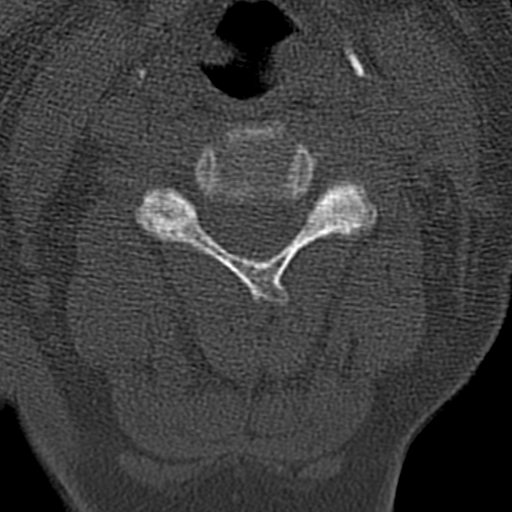
[im 29/44  bone]
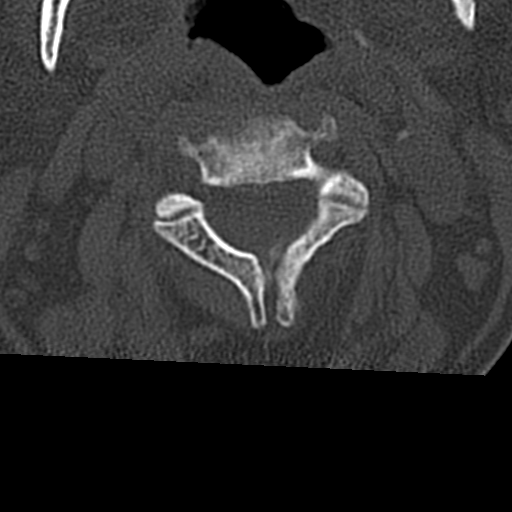

[15 of 47 positions shown; findings below may reference images not displayed]

FINDINGS: CT HEAD FINDINGS

Brain: Encephalomalacia in the frontal lobes bilaterally following
craniotomy for prior meningioma resection. No acute infarct,
hemorrhage or hydrocephalus.

Vascular: No hyperdense vessel or unexpected calcification.

Skull: Prior frontal craniotomy.  No acute calvarial abnormality.

Sinuses/Orbits: Visualized paranasal sinuses and mastoids clear.
Orbital soft tissues unremarkable.

Other: No free fluid or free air.

CT CERVICAL SPINE FINDINGS

Alignment: Motion degraded study.  Normal alignment.

Skull base and vertebrae: No fracture

Soft tissues and spinal canal: Prevertebral soft tissues are normal.
No epidural or paraspinal hematoma.

Disc levels: Degenerative disc disease changes C5-6 through C7-T1.
Mild bilateral degenerative facet disease.

Upper chest: Negative

Other: No acute findings.
IMPRESSION: Bifrontal encephalomalacia following prior craniotomy and surgical
resection of prior meningioma. No change. No acute intracranial
abnormality.

No acute bony abnormality in the cervical spine.

## 2018-11-03 IMAGING — DX DG HUMERUS 2V *L*
2 series · 2 of 2 positions shown · non-contrast
Comparison: None.

CLINICAL DATA: Status post fall with pain of the left humerus.

EXAM:
LEFT HUMERUS - 2+ VIEW

[humerus ap]
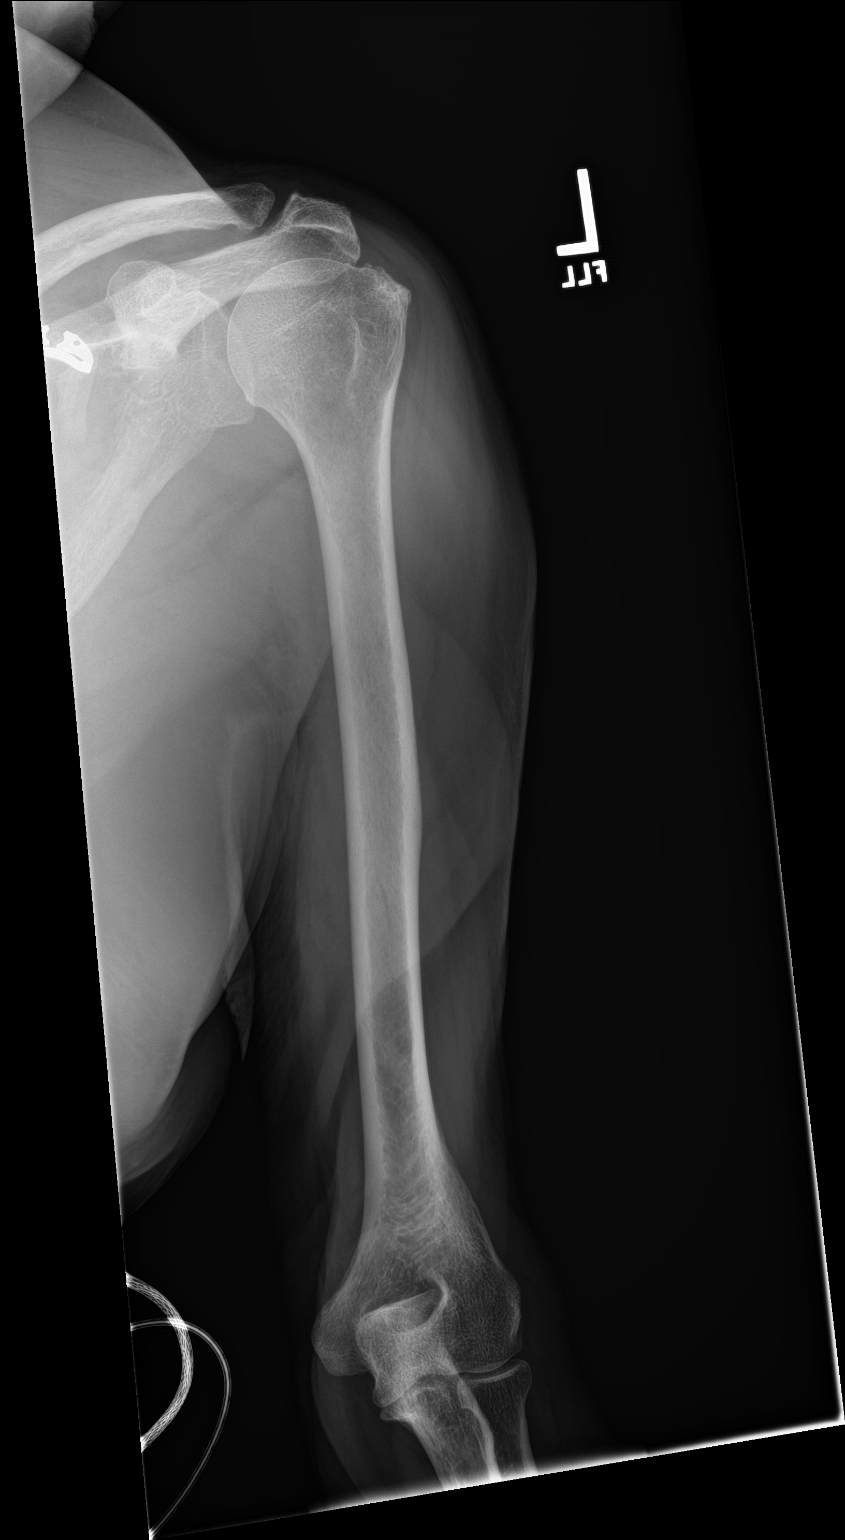

[humerus lat]
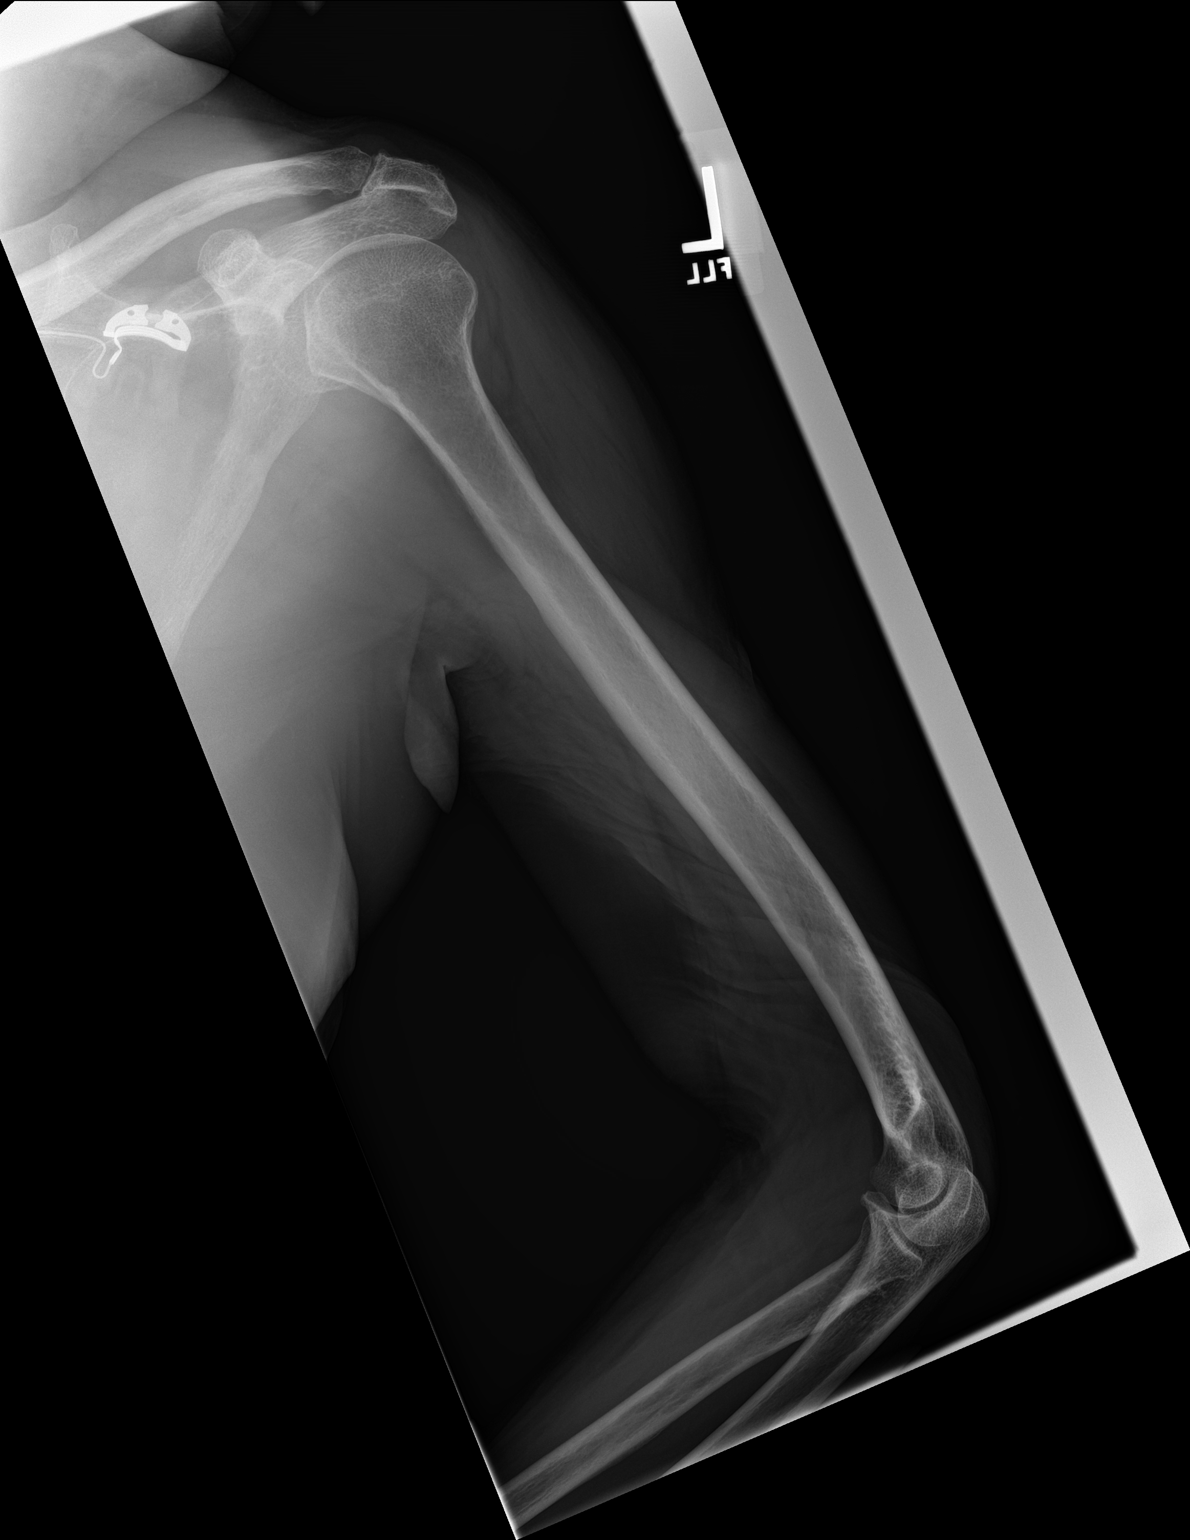

[2 of 2 positions shown; findings below may reference images not displayed]

FINDINGS: There is no evidence of fracture or dislocation. Soft tissues are
unremarkable.
IMPRESSION: Negative.

## 2018-11-04 ENCOUNTER — Other Ambulatory Visit: Payer: Self-pay | Admitting: Physician Assistant

## 2018-11-04 DIAGNOSIS — D320 Benign neoplasm of cerebral meninges: Secondary | ICD-10-CM | POA: Diagnosis not present

## 2018-11-04 DIAGNOSIS — M159 Polyosteoarthritis, unspecified: Secondary | ICD-10-CM | POA: Diagnosis not present

## 2018-11-04 DIAGNOSIS — Z794 Long term (current) use of insulin: Secondary | ICD-10-CM | POA: Diagnosis not present

## 2018-11-04 DIAGNOSIS — I69354 Hemiplegia and hemiparesis following cerebral infarction affecting left non-dominant side: Secondary | ICD-10-CM | POA: Diagnosis not present

## 2018-11-04 DIAGNOSIS — E1142 Type 2 diabetes mellitus with diabetic polyneuropathy: Secondary | ICD-10-CM | POA: Diagnosis not present

## 2018-11-04 DIAGNOSIS — J449 Chronic obstructive pulmonary disease, unspecified: Secondary | ICD-10-CM | POA: Diagnosis not present

## 2018-11-04 DIAGNOSIS — M5030 Other cervical disc degeneration, unspecified cervical region: Secondary | ICD-10-CM | POA: Diagnosis not present

## 2018-11-04 DIAGNOSIS — M7542 Impingement syndrome of left shoulder: Secondary | ICD-10-CM | POA: Diagnosis not present

## 2018-11-04 DIAGNOSIS — I110 Hypertensive heart disease with heart failure: Secondary | ICD-10-CM | POA: Diagnosis not present

## 2018-11-05 ENCOUNTER — Other Ambulatory Visit: Payer: Self-pay

## 2018-11-05 ENCOUNTER — Telehealth (HOSPITAL_COMMUNITY): Payer: Self-pay | Admitting: *Deleted

## 2018-11-05 ENCOUNTER — Telehealth: Payer: Self-pay | Admitting: Internal Medicine

## 2018-11-05 ENCOUNTER — Other Ambulatory Visit (HOSPITAL_COMMUNITY): Payer: Self-pay | Admitting: Psychiatry

## 2018-11-05 DIAGNOSIS — Z20822 Contact with and (suspected) exposure to covid-19: Secondary | ICD-10-CM

## 2018-11-05 DIAGNOSIS — R6889 Other general symptoms and signs: Secondary | ICD-10-CM | POA: Diagnosis not present

## 2018-11-05 MED ORDER — TRAZODONE HCL 100 MG PO TABS: 100.0000 mg | ORAL_TABLET | Freq: Every day | ORAL | 2 refills | Status: DC

## 2018-11-05 NOTE — Telephone Encounter (Signed)
Called and spoke w/ pt regarding Beth's recommendations. Pt verbalized understanding and is aware of the Covid Test protocol. Pt lives in [place], so I made her aware of the [place] testing site address and proximity to [facility] ED. Order for Covid test has been placed. I let pt know to call our office if we can help with anything else. Pt expressed understanding. Nothing further needed at this time.

## 2018-11-05 NOTE — Telephone Encounter (Signed)
Order covid testing. Recommend mucinex twice daily. Take tylenol for fever. Encourage fluids and rest. Stay away from others. Cant do much without a download in term of her cpap just reinforce importance of using.

## 2018-11-05 NOTE — Telephone Encounter (Signed)
sent 

## 2018-11-05 NOTE — Telephone Encounter (Signed)
[facility] called requested refill for Trazodone. Patient will have to start getting med's & refill thru [facility] per [facility] Team RN

## 2018-11-05 NOTE — Telephone Encounter (Signed)
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 She reports taking her temperature and thermometer reading 99.0*F. SpO2 95%. Pt is not on home O2; however, is on CPAP auto 5-15 and reports using it nightly. This being said, pt has had trouble with her current mask. She states it was serviced by her DME, [employer]; however, that it still gives her trouble. Pt also reports she feels as though her CPAP is not giving her enough air (I've checked [employer], last DL 05/14/2018; called [employer] and they do not have a recent 30 day compliance report). Medication wise, she reports taking Stiolto Respimat 2 puffs in the morning and her albuterol rescue inhaler 2 puffs q6h PRN. She states she is still smoking (10 cigs/daily) and has tried Chantix nicotine patches. Pt states she also has a bit of nausea and her appetite is subpar.   In the last month, have you been in contact with someone who was confirmed or suspected to have Conoravirus / COVID-19?  No  Do you have any of the following symptoms developed in the last 30 days? Fever: Low grade, 99*F Cough: Yes, occasional with beige mucus Shortness of breath: No  When did your symptoms start?  11/05/2018  If the patient has a fever, what is the last reading?  (use n/a if patient denies fever)  99.0*F . IF THE PATIENT STATES THEY DO NOT OWN A THERMOMETER, THEY MUST GO AND PURCHASE ONE When did the fever start?: This morning Have you taken any medication to suppress a fever (ie Ibuprofen, Aleve, Tylenol)?: No  Since CY is not in the office, I am routing this message to our provider of the day. Beth, please advise with your recommendations for this pt.

## 2018-11-06 LAB — NOVEL CORONAVIRUS, NAA: SARS-CoV-2, NAA: NOT DETECTED

## 2018-11-06 NOTE — Progress Notes (Signed)
COVID NEGATIVE

## 2018-11-08 ENCOUNTER — Ambulatory Visit (INDEPENDENT_AMBULATORY_CARE_PROVIDER_SITE_OTHER): Payer: Medicare HMO | Admitting: Psychiatry

## 2018-11-08 ENCOUNTER — Other Ambulatory Visit: Payer: Self-pay

## 2018-11-08 DIAGNOSIS — F331 Major depressive disorder, recurrent, moderate: Secondary | ICD-10-CM

## 2018-11-08 NOTE — Progress Notes (Signed)
Virtual Visit via Telephone Note  I connected with [name] on 11/08/18 at 11:00 AM EDT by telephone and verified that I am speaking with the correct person using two identifiers.   I discussed the limitations, risks, security and privacy concerns of performing an evaluation and management service by telephone and the availability of in person appointments. I also discussed with the patient that there may be a patient responsible charge related to this service. The patient expressed understanding and agreed to proceed.   I provided 50 minutes of non-face-to-face time during this encounter.   [name], LCSW                          THERAPIST PROGRESS NOTE  Session Time: Friday 11/08/2018 11:00 AM - 11:50 AM  Participatio  n Level: Active  Behavioral Response: talkative,  depressed   Type of Therapy: Individual Therapy  Treatment Goals addressed: Learn and implement behavioral strategies to overcome depression.                                                       Verbalize an understanding and resolution of current interpersonal problems.   Interventions: CBT and Supportive  Summary: [name] is a 68 y.o. female who presents with  long standing history of recurrent periods  of depression beginning when she was 67 and her favorite uncle died. Patient reports multiple psychiatric hospitlaizations due to depression and suicidal ideations with the last one occuring in 1997. Patient has participated in outpatient psychotherapy and medication management intermittently since age 26. She currently is seeing psychiatrist Dr. Harrington Challenger . Prior to this, she was seen at [facility]. Patient also has had ECT at [facility]. Symptoms have worsened in recent months due to family stress and have  included depressed mood, anxiety, excessive worry, and tearfulness.   Patient's last contact was by virtual visit via telephone  about 4 weeks ago. She reports continued stress related to medical  issues. She has continued support from medical community. She expresses continued frustration son does not spend more time with her. She reports expressing feelings to son but says he says he doesn't have time. She reports continuing to listen to church services on line for spiritual support. She also has been using imagery to try to relax. She says she hasn't called church prayer line yet but is planning to.  Suicidal/Homicidal: Nowithout intent/plan  Therapist Response: Reviewed symptoms, discussed stressors, facilitated expression of thoughts and feelings, validated feelings, praised and reinforced her use of relaxation technique, assisted patient to identify realistic expectations of interaction with son versus unrealistic demands, assisted patient identify other interests to pursue such as listening to music, continue to use spirituality and encouraged patient to follow through with contacting prayer line,  Plan: Return again in 2 weeks.  Diagnosis: Axis I: MDD, Recurrent, moderate    Axis II: Deferred    [name], LCSW 11/08/2018

## 2018-11-11 ENCOUNTER — Other Ambulatory Visit: Payer: Self-pay | Admitting: Family Medicine

## 2018-11-11 DIAGNOSIS — E1142 Type 2 diabetes mellitus with diabetic polyneuropathy: Secondary | ICD-10-CM | POA: Diagnosis not present

## 2018-11-11 DIAGNOSIS — Z794 Long term (current) use of insulin: Secondary | ICD-10-CM | POA: Diagnosis not present

## 2018-11-11 DIAGNOSIS — I110 Hypertensive heart disease with heart failure: Secondary | ICD-10-CM | POA: Diagnosis not present

## 2018-11-11 DIAGNOSIS — J449 Chronic obstructive pulmonary disease, unspecified: Secondary | ICD-10-CM | POA: Diagnosis not present

## 2018-11-11 DIAGNOSIS — M7542 Impingement syndrome of left shoulder: Secondary | ICD-10-CM | POA: Diagnosis not present

## 2018-11-11 DIAGNOSIS — I69354 Hemiplegia and hemiparesis following cerebral infarction affecting left non-dominant side: Secondary | ICD-10-CM | POA: Diagnosis not present

## 2018-11-11 DIAGNOSIS — M159 Polyosteoarthritis, unspecified: Secondary | ICD-10-CM | POA: Diagnosis not present

## 2018-11-11 DIAGNOSIS — D320 Benign neoplasm of cerebral meninges: Secondary | ICD-10-CM | POA: Diagnosis not present

## 2018-11-11 DIAGNOSIS — M5030 Other cervical disc degeneration, unspecified cervical region: Secondary | ICD-10-CM | POA: Diagnosis not present

## 2018-11-12 DIAGNOSIS — D320 Benign neoplasm of cerebral meninges: Secondary | ICD-10-CM | POA: Insufficient documentation

## 2018-11-12 DIAGNOSIS — J449 Chronic obstructive pulmonary disease, unspecified: Secondary | ICD-10-CM | POA: Diagnosis not present

## 2018-11-12 DIAGNOSIS — I10 Essential (primary) hypertension: Secondary | ICD-10-CM | POA: Diagnosis not present

## 2018-11-12 NOTE — Progress Notes (Signed)
  Radiation Oncology         [phone] ________________________________  Name: [name] MRN: [id number]  Date: 10/17/2018  DOB: [birth year]  DIAGNOSIS:     ICD-10-CM   1. Meningioma (HCC)  D32.9 acetaminophen (TYLENOL) tablet 650 mg  2. Benign neoplasm of cerebral meninges (HCC)  D32.0     NARRATIVE:  The patient was brought to the [place].  Identity was confirmed.  All relevant records and images related to the planned course of therapy were reviewed.  The patient freely provided informed written consent to proceed with treatment after reviewing the details related to the planned course of therapy. The consent form was witnessed and verified by the simulation staff. Intravenous access was established for contrast administration. Then, the patient was set-up in a stable reproducible supine position for radiation therapy.  A relocatable thermoplastic stereotactic head frame was fabricated for precise immobilization.  CT images were obtained.  Surface markings were placed.  The CT images were loaded into the planning software and fused with the patient's targeting MRI scan.  Then the target and avoidance structures were contoured.  Treatment planning then occurred.  The radiation prescription was entered and confirmed.  I have requested 3D planning  I have requested a DVH of the following structures: Brain stem, brain, left eye, right eye, lenses, optic chiasm, target volumes, uninvolved brain, and normal tissue.    SPECIAL TREATMENT PROCEDURE:  The planned course of therapy using radiation constitutes a special treatment procedure. Special care is required in the management of this patient for the following reasons. This treatment constitutes a Special Treatment Procedure for the following reason: High dose per fraction requiring special monitoring for increased toxicities of treatment including daily imaging.  The special nature of the planned course of radiotherapy will  require increased physician supervision and oversight to ensure patient's safety with optimal treatment outcomes.  PLAN:  The patient will receive 14 Gy in 1 fraction.   ------------------------------------------------  Jodelle Gross, MD, PhD

## 2018-11-13 ENCOUNTER — Other Ambulatory Visit (HOSPITAL_COMMUNITY): Payer: Self-pay | Admitting: Psychiatry

## 2018-11-15 DIAGNOSIS — M159 Polyosteoarthritis, unspecified: Secondary | ICD-10-CM | POA: Diagnosis not present

## 2018-11-15 DIAGNOSIS — I69354 Hemiplegia and hemiparesis following cerebral infarction affecting left non-dominant side: Secondary | ICD-10-CM | POA: Diagnosis not present

## 2018-11-15 DIAGNOSIS — I110 Hypertensive heart disease with heart failure: Secondary | ICD-10-CM | POA: Diagnosis not present

## 2018-11-15 DIAGNOSIS — M7542 Impingement syndrome of left shoulder: Secondary | ICD-10-CM | POA: Diagnosis not present

## 2018-11-15 DIAGNOSIS — D320 Benign neoplasm of cerebral meninges: Secondary | ICD-10-CM | POA: Diagnosis not present

## 2018-11-15 DIAGNOSIS — M5030 Other cervical disc degeneration, unspecified cervical region: Secondary | ICD-10-CM | POA: Diagnosis not present

## 2018-11-15 DIAGNOSIS — Z794 Long term (current) use of insulin: Secondary | ICD-10-CM | POA: Diagnosis not present

## 2018-11-15 DIAGNOSIS — J449 Chronic obstructive pulmonary disease, unspecified: Secondary | ICD-10-CM | POA: Diagnosis not present

## 2018-11-15 DIAGNOSIS — E1142 Type 2 diabetes mellitus with diabetic polyneuropathy: Secondary | ICD-10-CM | POA: Diagnosis not present

## 2018-11-16 DIAGNOSIS — I10 Essential (primary) hypertension: Secondary | ICD-10-CM | POA: Diagnosis not present

## 2018-11-16 DIAGNOSIS — M179 Osteoarthritis of knee, unspecified: Secondary | ICD-10-CM | POA: Diagnosis not present

## 2018-11-16 DIAGNOSIS — Z9181 History of falling: Secondary | ICD-10-CM | POA: Diagnosis not present

## 2018-11-20 DIAGNOSIS — M25512 Pain in left shoulder: Secondary | ICD-10-CM | POA: Diagnosis not present

## 2018-11-20 NOTE — Progress Notes (Signed)
  Radiation Oncology         ([phone]) [phone] ________________________________  Name: [name] MRN: [id number]  Date: 10/17/2018  DOB: 12-06-1950  End of Treatment Note  Diagnosis:   Recurrent sphenoid meningioma     Indication for treatment:  palliative       Radiation treatment dates:   10/17/18  Site/dose:    The tumor was treated to a dose of 14 Gray in 1 fraction using a stereotactic radiosurgery technique.  This consisted of 3 IMRT fields.  Narrative: The patient tolerated radiation treatment well.   There were no signs of acute toxicity after treatment.  Plan: The patient has completed radiation treatment. The patient will return to radiation oncology clinic for routine followup in one month. I advised the patient to call or return sooner if they have any questions or concerns related to their recovery or treatment. ________________________________  ------------------------------------------------  Jodelle Gross, MD, PhD

## 2018-11-25 ENCOUNTER — Ambulatory Visit
Admission: RE | Admit: 2018-11-25 | Discharge: 2018-11-25 | Disposition: A | Discharge status: Home / Self Care | Payer: Medicare HMO | Source: Ambulatory Visit | Attending: Family Medicine | Admitting: Family Medicine

## 2018-11-25 ENCOUNTER — Other Ambulatory Visit: Payer: Self-pay

## 2018-11-25 DIAGNOSIS — Z1231 Encounter for screening mammogram for malignant neoplasm of breast: Secondary | ICD-10-CM

## 2018-11-25 DIAGNOSIS — D320 Benign neoplasm of cerebral meninges: Secondary | ICD-10-CM | POA: Diagnosis not present

## 2018-11-25 DIAGNOSIS — M7542 Impingement syndrome of left shoulder: Secondary | ICD-10-CM | POA: Diagnosis not present

## 2018-11-25 DIAGNOSIS — E1142 Type 2 diabetes mellitus with diabetic polyneuropathy: Secondary | ICD-10-CM | POA: Diagnosis not present

## 2018-11-25 DIAGNOSIS — J449 Chronic obstructive pulmonary disease, unspecified: Secondary | ICD-10-CM | POA: Diagnosis not present

## 2018-11-25 DIAGNOSIS — Z794 Long term (current) use of insulin: Secondary | ICD-10-CM | POA: Diagnosis not present

## 2018-11-25 DIAGNOSIS — I110 Hypertensive heart disease with heart failure: Secondary | ICD-10-CM | POA: Diagnosis not present

## 2018-11-25 DIAGNOSIS — I69354 Hemiplegia and hemiparesis following cerebral infarction affecting left non-dominant side: Secondary | ICD-10-CM | POA: Diagnosis not present

## 2018-11-25 DIAGNOSIS — M159 Polyosteoarthritis, unspecified: Secondary | ICD-10-CM | POA: Diagnosis not present

## 2018-11-25 DIAGNOSIS — M5030 Other cervical disc degeneration, unspecified cervical region: Secondary | ICD-10-CM | POA: Diagnosis not present

## 2018-11-28 ENCOUNTER — Ambulatory Visit (INDEPENDENT_AMBULATORY_CARE_PROVIDER_SITE_OTHER): Payer: Medicare HMO | Admitting: Psychiatry

## 2018-11-28 ENCOUNTER — Other Ambulatory Visit: Payer: Self-pay

## 2018-11-28 DIAGNOSIS — E785 Hyperlipidemia, unspecified: Secondary | ICD-10-CM | POA: Diagnosis not present

## 2018-11-28 DIAGNOSIS — F331 Major depressive disorder, recurrent, moderate: Secondary | ICD-10-CM

## 2018-11-28 DIAGNOSIS — G4733 Obstructive sleep apnea (adult) (pediatric): Secondary | ICD-10-CM | POA: Diagnosis not present

## 2018-11-28 NOTE — Progress Notes (Signed)
Virtual Visit via Telephone Note  I connected with [name] on 11/28/18 at 9:16 AM EDT by telephone and verified that I am speaking with the correct person using two identifiers.   I discussed the limitations, risks, security and privacy concerns of performing an evaluation and management service by telephone and the availability of in person appointments. I also discussed with the patient that there may be a patient responsible charge related to this service. The patient expressed understanding and agreed to proceed.   I provided 44 minutes of non-face-to-face time during this encounter.   [name], LCSW     THERAPIST PROGRESS NOTE  Session Time: Thursday 11/28/2018 9:16 AM -  10:00 AM   Participation Level: Active  Behavioral Response: lethargic, drowsy, lessdepressed   Type of Therapy: Individual Therapy  Treatment Goals addressed: Learn and implement behavioral strategies to overcome depression.                                                       Verbalize an understanding and resolution of current interpersonal problems.   Interventions: CBT and Supportive  Summary: [name] is a 68 y.o. female who presents with  long standing history of recurrent periods  of depression beginning when she was 36 and her favorite uncle died. Patient reports multiple psychiatric hospitlaizations due to depression and suicidal ideations with the last one occuring in 1997. Patient has participated in outpatient psychotherapy and medication management intermittently since age 89. She currently is seeing psychiatrist Dr. Harrington Challenger . Prior to this, she was seen at [facility]. Patient also has had ECT at [facility]. Symptoms have worsened in recent months due to family stress and have  included depressed mood, anxiety, excessive worry, and tearfulness.   Patient's last contact was by virtual visit via telephone  3-4 weeks ago. She reports increased sleep difficulty and fatigue due to pain. She  denies taking naps during the day. She reports managing depression better and being less depressed . She reports resuming interest in activities like reading and watching TV. She continues to use her spirituality, attends church online, and reports recently calling prayer line. She says this was very helpful.  She also reports using imagery as a relaxation technique.  She reports deciding to quit smoking and sbeginning efforts yesterday.  She expresses increased acceptance of being unable to control son's behavior. She reports enjoying recent visit to his home to see her granddaughter. She expresses frustration regarding niece managing her money and recently used assertiveness skills to confront niece regarding recent charges to patient's bank account.    Suicidal/Homicidal: Nowithout intent/plan  Therapist Response: Reviewed symptoms, praised and reinforced patient's use of healthy coping skills and using her spirituality, discussed effects, praised and reinforced patient's decision to quit smoking and discussed possible resources,  discussed stressors, facilitated expression of thoughts and feelings, validated feelings, began to discuss next steps for treatment as patient reports unresolved feelings of hurt and anger about her past prevent her from moving forward, assigned patient to make list of how her behaviors, thoughts, and mood would be different if she was moving forward in preparation for next session    Plan: Return again in 2 weeks.  Diagnosis: Axis I: MDD, Recurrent, moderate    Axis II: Deferred    Rhilee Currin E Ashlin Kreps, LCSW  11/28/2018 

## 2018-11-29 DIAGNOSIS — G894 Chronic pain syndrome: Secondary | ICD-10-CM | POA: Diagnosis not present

## 2018-11-29 DIAGNOSIS — Z5181 Encounter for therapeutic drug level monitoring: Secondary | ICD-10-CM | POA: Diagnosis not present

## 2018-11-29 DIAGNOSIS — Z79899 Other long term (current) drug therapy: Secondary | ICD-10-CM | POA: Diagnosis not present

## 2018-12-01 DIAGNOSIS — E119 Type 2 diabetes mellitus without complications: Secondary | ICD-10-CM | POA: Diagnosis not present

## 2018-12-01 DIAGNOSIS — M6281 Muscle weakness (generalized): Secondary | ICD-10-CM | POA: Diagnosis not present

## 2018-12-01 DIAGNOSIS — I69359 Hemiplegia and hemiparesis following cerebral infarction affecting unspecified side: Secondary | ICD-10-CM | POA: Diagnosis not present

## 2018-12-01 DIAGNOSIS — Z9181 History of falling: Secondary | ICD-10-CM | POA: Diagnosis not present

## 2018-12-02 ENCOUNTER — Other Ambulatory Visit: Payer: Self-pay

## 2018-12-02 ENCOUNTER — Other Ambulatory Visit: Payer: Self-pay | Admitting: Family Medicine

## 2018-12-02 ENCOUNTER — Ambulatory Visit: Payer: Medicare HMO

## 2018-12-03 ENCOUNTER — Other Ambulatory Visit (HOSPITAL_COMMUNITY): Payer: Self-pay | Admitting: Psychiatry

## 2018-12-03 MED ORDER — LORAZEPAM 0.5 MG PO TABS: 0.5000 mg | ORAL_TABLET | Freq: Two times a day (BID) | ORAL | 2 refills | Status: DC

## 2018-12-04 ENCOUNTER — Emergency Department (HOSPITAL_COMMUNITY): Payer: Medicare HMO

## 2018-12-04 ENCOUNTER — Telehealth: Payer: Self-pay | Admitting: *Deleted

## 2018-12-04 ENCOUNTER — Other Ambulatory Visit: Payer: Self-pay

## 2018-12-04 ENCOUNTER — Inpatient Hospital Stay (HOSPITAL_COMMUNITY)
Admission: EM | Admit: 2018-12-04 | Discharge: 2018-12-08 | DRG: 389 | Disposition: A | Discharge status: Home Health | Payer: Medicare HMO | Attending: Family Medicine | Admitting: Family Medicine

## 2018-12-04 ENCOUNTER — Encounter (HOSPITAL_COMMUNITY): Payer: Self-pay | Admitting: Emergency Medicine

## 2018-12-04 ENCOUNTER — Inpatient Hospital Stay (HOSPITAL_COMMUNITY): Payer: Medicare HMO

## 2018-12-04 ENCOUNTER — Ambulatory Visit: Payer: Self-pay | Admitting: Internal Medicine

## 2018-12-04 DIAGNOSIS — R569 Unspecified convulsions: Secondary | ICD-10-CM

## 2018-12-04 DIAGNOSIS — Z888 Allergy status to other drugs, medicaments and biological substances status: Secondary | ICD-10-CM | POA: Diagnosis not present

## 2018-12-04 DIAGNOSIS — F17210 Nicotine dependence, cigarettes, uncomplicated: Secondary | ICD-10-CM | POA: Diagnosis present

## 2018-12-04 DIAGNOSIS — R1900 Intra-abdominal and pelvic swelling, mass and lump, unspecified site: Secondary | ICD-10-CM | POA: Diagnosis not present

## 2018-12-04 DIAGNOSIS — R1084 Generalized abdominal pain: Secondary | ICD-10-CM

## 2018-12-04 DIAGNOSIS — Z7982 Long term (current) use of aspirin: Secondary | ICD-10-CM | POA: Diagnosis not present

## 2018-12-04 DIAGNOSIS — R140 Abdominal distension (gaseous): Secondary | ICD-10-CM | POA: Diagnosis not present

## 2018-12-04 DIAGNOSIS — Z4682 Encounter for fitting and adjustment of non-vascular catheter: Secondary | ICD-10-CM | POA: Diagnosis not present

## 2018-12-04 DIAGNOSIS — Z91011 Allergy to milk products: Secondary | ICD-10-CM

## 2018-12-04 DIAGNOSIS — Z833 Family history of diabetes mellitus: Secondary | ICD-10-CM

## 2018-12-04 DIAGNOSIS — G894 Chronic pain syndrome: Secondary | ICD-10-CM | POA: Diagnosis present

## 2018-12-04 DIAGNOSIS — R609 Edema, unspecified: Secondary | ICD-10-CM | POA: Diagnosis not present

## 2018-12-04 DIAGNOSIS — Z79890 Hormone replacement therapy (postmenopausal): Secondary | ICD-10-CM

## 2018-12-04 DIAGNOSIS — Z823 Family history of stroke: Secondary | ICD-10-CM

## 2018-12-04 DIAGNOSIS — E039 Hypothyroidism, unspecified: Secondary | ICD-10-CM | POA: Diagnosis present

## 2018-12-04 DIAGNOSIS — I10 Essential (primary) hypertension: Secondary | ICD-10-CM | POA: Diagnosis present

## 2018-12-04 DIAGNOSIS — Z79899 Other long term (current) drug therapy: Secondary | ICD-10-CM

## 2018-12-04 DIAGNOSIS — E119 Type 2 diabetes mellitus without complications: Secondary | ICD-10-CM | POA: Diagnosis present

## 2018-12-04 DIAGNOSIS — Z794 Long term (current) use of insulin: Secondary | ICD-10-CM | POA: Diagnosis not present

## 2018-12-04 DIAGNOSIS — Z8719 Personal history of other diseases of the digestive system: Secondary | ICD-10-CM | POA: Diagnosis not present

## 2018-12-04 DIAGNOSIS — K56609 Unspecified intestinal obstruction, unspecified as to partial versus complete obstruction: Principal | ICD-10-CM | POA: Diagnosis present

## 2018-12-04 DIAGNOSIS — Z90710 Acquired absence of both cervix and uterus: Secondary | ICD-10-CM

## 2018-12-04 DIAGNOSIS — E785 Hyperlipidemia, unspecified: Secondary | ICD-10-CM | POA: Diagnosis present

## 2018-12-04 DIAGNOSIS — G40909 Epilepsy, unspecified, not intractable, without status epilepticus: Secondary | ICD-10-CM | POA: Diagnosis present

## 2018-12-04 DIAGNOSIS — Z880 Allergy status to penicillin: Secondary | ICD-10-CM

## 2018-12-04 DIAGNOSIS — Z800 Family history of malignant neoplasm of digestive organs: Secondary | ICD-10-CM

## 2018-12-04 DIAGNOSIS — M818 Other osteoporosis without current pathological fracture: Secondary | ICD-10-CM | POA: Diagnosis present

## 2018-12-04 DIAGNOSIS — J449 Chronic obstructive pulmonary disease, unspecified: Secondary | ICD-10-CM | POA: Diagnosis not present

## 2018-12-04 DIAGNOSIS — I69359 Hemiplegia and hemiparesis following cerebral infarction affecting unspecified side: Secondary | ICD-10-CM

## 2018-12-04 DIAGNOSIS — Z9049 Acquired absence of other specified parts of digestive tract: Secondary | ICD-10-CM | POA: Diagnosis not present

## 2018-12-04 DIAGNOSIS — F17200 Nicotine dependence, unspecified, uncomplicated: Secondary | ICD-10-CM | POA: Diagnosis present

## 2018-12-04 DIAGNOSIS — F319 Bipolar disorder, unspecified: Secondary | ICD-10-CM | POA: Diagnosis not present

## 2018-12-04 DIAGNOSIS — R109 Unspecified abdominal pain: Secondary | ICD-10-CM | POA: Diagnosis not present

## 2018-12-04 DIAGNOSIS — Z79891 Long term (current) use of opiate analgesic: Secondary | ICD-10-CM | POA: Diagnosis not present

## 2018-12-04 DIAGNOSIS — R112 Nausea with vomiting, unspecified: Secondary | ICD-10-CM

## 2018-12-04 DIAGNOSIS — Z4659 Encounter for fitting and adjustment of other gastrointestinal appliance and device: Secondary | ICD-10-CM

## 2018-12-04 DIAGNOSIS — Z20828 Contact with and (suspected) exposure to other viral communicable diseases: Secondary | ICD-10-CM | POA: Diagnosis present

## 2018-12-04 DIAGNOSIS — F39 Unspecified mood [affective] disorder: Secondary | ICD-10-CM | POA: Diagnosis present

## 2018-12-04 DIAGNOSIS — Z720 Tobacco use: Secondary | ICD-10-CM | POA: Diagnosis present

## 2018-12-04 DIAGNOSIS — K219 Gastro-esophageal reflux disease without esophagitis: Secondary | ICD-10-CM | POA: Diagnosis present

## 2018-12-04 DIAGNOSIS — Z03818 Encounter for observation for suspected exposure to other biological agents ruled out: Secondary | ICD-10-CM | POA: Diagnosis not present

## 2018-12-04 DIAGNOSIS — Z8249 Family history of ischemic heart disease and other diseases of the circulatory system: Secondary | ICD-10-CM

## 2018-12-04 DIAGNOSIS — K449 Diaphragmatic hernia without obstruction or gangrene: Secondary | ICD-10-CM | POA: Diagnosis not present

## 2018-12-04 DIAGNOSIS — Z841 Family history of disorders of kidney and ureter: Secondary | ICD-10-CM

## 2018-12-04 DIAGNOSIS — Z0189 Encounter for other specified special examinations: Secondary | ICD-10-CM

## 2018-12-04 DIAGNOSIS — R52 Pain, unspecified: Secondary | ICD-10-CM | POA: Diagnosis not present

## 2018-12-04 LAB — COMPREHENSIVE METABOLIC PANEL
ALT: 23 U/L (ref 0–44)
AST: 20 U/L (ref 15–41)
Albumin: 4.3 g/dL (ref 3.5–5.0)
Alkaline Phosphatase: 97 U/L (ref 38–126)
Anion gap: 12 (ref 5–15)
BUN: 19 mg/dL (ref 8–23)
CO2: 25 mmol/L (ref 22–32)
Calcium: 9.4 mg/dL (ref 8.9–10.3)
Chloride: 104 mmol/L (ref 98–111)
Creatinine, Ser: 0.85 mg/dL (ref 0.44–1.00)
GFR calc Af Amer: >60 mL/min (ref >60)
GFR calc non Af Amer: >60 mL/min (ref >60)
Glucose, Bld: 172 mg/dL — ABNORMAL HIGH (ref 70–99)
Potassium: 3.3 mmol/L — ABNORMAL LOW (ref 3.5–5.1)
Sodium: 141 mmol/L (ref 135–145)
Total Bilirubin: 0.3 mg/dL (ref 0.3–1.2)
Total Protein: 7.8 g/dL (ref 6.5–8.1)

## 2018-12-04 LAB — CBC WITH DIFFERENTIAL/PLATELET
Abs Immature Granulocytes: 0.12 10*3/uL — ABNORMAL HIGH (ref 0.00–0.07)
Basophils Absolute: 0.1 10*3/uL (ref 0.0–0.1)
Basophils Relative: 1 %
Eosinophils Absolute: 0.2 10*3/uL (ref 0.0–0.5)
Eosinophils Relative: 1 %
HCT: 41.5 % (ref 36.0–46.0)
Hemoglobin: 11.6 g/dL — ABNORMAL LOW (ref 12.0–15.0)
Immature Granulocytes: 1 %
Lymphocytes Relative: 11 %
Lymphs Abs: 1.2 10*3/uL (ref 0.7–4.0)
MCH: 22.1 pg — ABNORMAL LOW (ref 26.0–34.0)
MCHC: 28 g/dL — ABNORMAL LOW (ref 30.0–36.0)
MCV: 78.9 fL — ABNORMAL LOW (ref 80.0–100.0)
Monocytes Absolute: 0.6 10*3/uL (ref 0.1–1.0)
Monocytes Relative: 6 %
Neutro Abs: 8.5 10*3/uL — ABNORMAL HIGH (ref 1.7–7.7)
Neutrophils Relative %: 80 %
Platelets: 294 10*3/uL (ref 150–400)
RBC: 5.26 MIL/uL — ABNORMAL HIGH (ref 3.87–5.11)
RDW: 15.1 % (ref 11.5–15.5)
WBC: 10.6 10*3/uL — ABNORMAL HIGH (ref 4.0–10.5)
nRBC: 0 % (ref 0.0–0.2)

## 2018-12-04 LAB — URINALYSIS, ROUTINE W REFLEX MICROSCOPIC
Bacteria, UA: NONE SEEN
Bilirubin Urine: NEGATIVE
Glucose, UA: 150 mg/dL — AB
Hgb urine dipstick: NEGATIVE
Ketones, ur: NEGATIVE mg/dL
Leukocytes,Ua: NEGATIVE
Nitrite: NEGATIVE
Protein, ur: 30 mg/dL — AB
Specific Gravity, Urine: 1.03 (ref 1.005–1.030)
pH: 6 (ref 5.0–8.0)

## 2018-12-04 LAB — GLUCOSE, CAPILLARY
Glucose-Capillary: 124 mg/dL — ABNORMAL HIGH (ref 70–99)
Glucose-Capillary: 140 mg/dL — ABNORMAL HIGH (ref 70–99)
Glucose-Capillary: 168 mg/dL — ABNORMAL HIGH (ref 70–99)

## 2018-12-04 LAB — CBG MONITORING, ED: Glucose-Capillary: 287 mg/dL — ABNORMAL HIGH (ref 70–99)

## 2018-12-04 LAB — SARS CORONAVIRUS 2 (TAT 6-24 HRS): SARS Coronavirus 2: NEGATIVE

## 2018-12-04 MED ORDER — LORAZEPAM 2 MG/ML IJ SOLN
1.0000 mg | Freq: Four times a day (QID) | INTRAMUSCULAR | Status: DC | PRN
  Administered 2018-12-04 – 2018-12-08 (×4): 1 mg via INTRAVENOUS
  Filled 2018-12-04 (×4): qty 1

## 2018-12-04 MED ORDER — ACETAMINOPHEN 650 MG RE SUPP
650.0000 mg | Freq: Four times a day (QID) | RECTAL | Status: DC | PRN
  Administered 2018-12-04: 23:00:00 650 mg via RECTAL
  Filled 2018-12-04 (×2): qty 1

## 2018-12-04 MED ORDER — METOPROLOL TARTRATE 5 MG/5ML IV SOLN
2.5000 mg | Freq: Four times a day (QID) | INTRAVENOUS | Status: DC
  Administered 2018-12-04 – 2018-12-05 (×4): 2.5 mg via INTRAVENOUS
  Filled 2018-12-04 (×4): qty 5

## 2018-12-04 MED ORDER — SODIUM CHLORIDE 0.9 % IV BOLUS
500.0000 mL | Freq: Once | INTRAVENOUS | Status: AC
  Administered 2018-12-04: 500 mL via INTRAVENOUS

## 2018-12-04 MED ORDER — ONDANSETRON HCL 4 MG/2ML IJ SOLN
INTRAMUSCULAR | Status: AC
  Administered 2018-12-04: 14:00:00
  Filled 2018-12-04: qty 2

## 2018-12-04 MED ORDER — SODIUM CHLORIDE 0.9% FLUSH
3.0000 mL | Freq: Two times a day (BID) | INTRAVENOUS | Status: DC
  Administered 2018-12-05 – 2018-12-08 (×5): 3 mL via INTRAVENOUS

## 2018-12-04 MED ORDER — ACETAMINOPHEN 325 MG PO TABS
650.0000 mg | ORAL_TABLET | Freq: Four times a day (QID) | ORAL | Status: DC | PRN
  Administered 2018-12-07: 650 mg via ORAL
  Filled 2018-12-04: qty 2

## 2018-12-04 MED ORDER — HYDRALAZINE HCL 20 MG/ML IJ SOLN
10.0000 mg | Freq: Four times a day (QID) | INTRAMUSCULAR | Status: DC | PRN
  Administered 2018-12-04: 10 mg via INTRAVENOUS
  Filled 2018-12-04: qty 1

## 2018-12-04 MED ORDER — KETOROLAC TROMETHAMINE 30 MG/ML IJ SOLN
30.0000 mg | Freq: Once | INTRAMUSCULAR | Status: AC
  Administered 2018-12-04: 30 mg via INTRAVENOUS
  Filled 2018-12-04: qty 1

## 2018-12-04 MED ORDER — ONDANSETRON 8 MG PO TBDP
8.0000 mg | ORAL_TABLET | Freq: Once | ORAL | Status: AC
  Administered 2018-12-04: 8 mg via ORAL
  Filled 2018-12-04: qty 1

## 2018-12-04 MED ORDER — POTASSIUM CHLORIDE 10 MEQ/100ML IV SOLN
10.0000 meq | INTRAVENOUS | Status: AC
  Administered 2018-12-04: 19:00:00 10 meq via INTRAVENOUS
  Filled 2018-12-04: qty 100

## 2018-12-04 MED ORDER — ONDANSETRON HCL 4 MG/2ML IJ SOLN
4.0000 mg | Freq: Once | INTRAMUSCULAR | Status: AC
  Administered 2018-12-04: 4 mg via INTRAVENOUS
  Filled 2018-12-04: qty 2

## 2018-12-04 MED ORDER — INSULIN ASPART 100 UNIT/ML ~~LOC~~ SOLN: 0.0000 [IU] | Freq: Every day | SUBCUTANEOUS | Status: DC

## 2018-12-04 MED ORDER — SODIUM CHLORIDE 0.9 % IV SOLN
INTRAVENOUS | Status: DC
  Administered 2018-12-04 – 2018-12-08 (×7): via INTRAVENOUS

## 2018-12-04 MED ORDER — SODIUM CHLORIDE 0.9% FLUSH: 3.0000 mL | INTRAVENOUS | Status: DC | PRN

## 2018-12-04 MED ORDER — POLYETHYLENE GLYCOL 3350 17 G PO PACK: 17.0000 g | PACK | Freq: Every day | ORAL | Status: DC | PRN

## 2018-12-04 MED ORDER — HEPARIN SODIUM (PORCINE) 5000 UNIT/ML IJ SOLN
5000.0000 [IU] | Freq: Three times a day (TID) | INTRAMUSCULAR | Status: DC
  Administered 2018-12-04 – 2018-12-08 (×11): 5000 [IU] via SUBCUTANEOUS
  Filled 2018-12-04 (×11): qty 1

## 2018-12-04 MED ORDER — SODIUM CHLORIDE 0.9 % IV SOLN: 250.0000 mL | INTRAVENOUS | Status: DC | PRN

## 2018-12-04 MED ORDER — BISACODYL 10 MG RE SUPP
10.0000 mg | Freq: Once | RECTAL | Status: AC
  Administered 2018-12-04: 10 mg via RECTAL
  Filled 2018-12-04: qty 1

## 2018-12-04 MED ORDER — MORPHINE SULFATE (PF) 2 MG/ML IV SOLN
2.0000 mg | INTRAVENOUS | Status: DC | PRN
  Administered 2018-12-04: 2 mg via INTRAVENOUS
  Filled 2018-12-04 (×2): qty 1

## 2018-12-04 MED ORDER — FENTANYL CITRATE (PF) 100 MCG/2ML IJ SOLN
50.0000 ug | Freq: Once | INTRAMUSCULAR | Status: AC
  Administered 2018-12-04: 50 ug via INTRAVENOUS
  Filled 2018-12-04: qty 2

## 2018-12-04 MED ORDER — ONDANSETRON HCL 4 MG/2ML IJ SOLN
4.0000 mg | INTRAMUSCULAR | Status: DC | PRN
  Administered 2018-12-04 – 2018-12-06 (×3): 4 mg via INTRAVENOUS
  Filled 2018-12-04 (×2): qty 2

## 2018-12-04 MED ORDER — IOHEXOL 300 MG/ML  SOLN
100.0000 mL | Freq: Once | INTRAMUSCULAR | Status: AC | PRN
  Administered 2018-12-04: 09:00:00 100 mL via INTRAVENOUS

## 2018-12-04 MED ORDER — POTASSIUM CHLORIDE 10 MEQ/100ML IV SOLN
10.0000 meq | INTRAVENOUS | Status: AC
  Administered 2018-12-04 (×2): 10 meq via INTRAVENOUS
  Filled 2018-12-04 (×2): qty 100

## 2018-12-04 MED ORDER — HYDROMORPHONE HCL 1 MG/ML IJ SOLN
1.0000 mg | INTRAMUSCULAR | Status: DC | PRN
  Administered 2018-12-05 – 2018-12-07 (×6): 1 mg via INTRAVENOUS
  Filled 2018-12-04 (×6): qty 1

## 2018-12-04 MED ORDER — BISACODYL 10 MG RE SUPP
10.0000 mg | Freq: Once | RECTAL | Status: AC
  Administered 2018-12-04: 19:00:00 10 mg via RECTAL
  Filled 2018-12-04: qty 1

## 2018-12-04 MED ORDER — ALBUTEROL SULFATE (2.5 MG/3ML) 0.083% IN NEBU: 2.5000 mg | INHALATION_SOLUTION | RESPIRATORY_TRACT | Status: DC | PRN

## 2018-12-04 MED ORDER — PHENOL 1.4 % MT LIQD
1.0000 | OROMUCOSAL | Status: DC | PRN
  Administered 2018-12-05: 1 via OROMUCOSAL
  Filled 2018-12-04: qty 177

## 2018-12-04 MED ORDER — TRAZODONE HCL 50 MG PO TABS: 50.0000 mg | ORAL_TABLET | Freq: Every evening | ORAL | Status: DC | PRN

## 2018-12-04 MED ORDER — INSULIN ASPART 100 UNIT/ML ~~LOC~~ SOLN
0.0000 [IU] | Freq: Three times a day (TID) | SUBCUTANEOUS | Status: DC
  Administered 2018-12-04: 2 [IU] via SUBCUTANEOUS
  Administered 2018-12-04: 5 [IU] via SUBCUTANEOUS
  Administered 2018-12-05: 09:00:00 2 [IU] via SUBCUTANEOUS
  Filled 2018-12-04: qty 1

## 2018-12-04 NOTE — H&P (Signed)
Patient Demographics:    [name], is a 68 y.o. female  MRN: [id number]   DOB - [birth year]  Admit Date - 12/04/2018  Outpatient Primary MD for the patient is [name], MD   Assessment & Plan:    Active Problems:   SBO (small bowel obstruction) (HCC)  1)SBO--- recurrent with history of same , CT abd/Pelvis with Small bowel obstruction with evidence of transition zone and proximal ileal region. Discussed with general surgeon, dulcolax supp as ordered, c/n NG tube suction after reposition NG tube -Antiemetics and pain management as ordered FEN--- continue IV fluids while n.p.o.,  2)Febrile Illness--- temp now over 101, ?? related to #1 above, chest imaging study without pneumonia, UA not suggestive of UTI, blood cultures pending, hold off on antibiotics  3)HTN-hold amlodipine and losartan until oral intake resumes, may give IV metoprolol,   may use IV labetalol when necessary  Every 4 hours for systolic blood pressure over 160 mmhg  4)DM2--- PTA patient was on Lantus insulin 20 units daily, hold this for now use sliding scale coverage given n.p.o. status  5) chronic pain/Depression--stable, continue lorazepam, risperidone, Zoloft, trazodone, Lamictal, Lyrica and Skelaxin.-Hold Oxycodone, may use IV pain medications  6) hypothyroidism--- PTA was on levothyroxine 50 mcg daily ,hold for couple days if unable to take oral intake after couple days consider IV levothyroxine at 25 mcg daily  With History of - Reviewed by me  Past Medical History:  Diagnosis Date   Allergy    Anemia    Anxiety    takes Ativan daily   Arthritis    Assistance needed for mobility    Bipolar disorder ([place])    takes Risperdal nightly   Blood transfusion    Cancer ([place])    In her gum   Carpal tunnel syndrome of  right wrist 05/23/2011   Cervical disc disorder with radiculopathy of cervical region 10/31/2012   Chronic back pain    Chronic idiopathic constipation    Chronic neck and back pain    Colon polyps    COPD (chronic obstructive pulmonary disease) with chronic bronchitis ([place]) 09/16/2013   Office Spirometry 10/30/2013-submaximal effort based on appearance of loop and curve. Numbers would fit with severe restriction but her physiologic capability may be better than this. FVC 0.91/44%, and 10.74/45%, FEV1/FVC 0.81, FEF 25-75% 1.43/69%     Depression    takes Zoloft daily   Diabetes mellitus    Type II   Diverticulosis    TCS 9/08 by Dr. Delfin Edis for diarrhea . Bx for micro scopic colitis negative.    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   Metabolic encephalopathy [id number]   Migraines    chronic headaches   Mononeuritis lower limb    Narcolepsy    Osteoporosis    Pancreatitis 2006   due to Depakote with normal EUS    Schatzki's ring    non critical / EGD with ED 8/2011with RMR   Seizures ([place])    takes Lamictal daily.Last seizure 3 yrs ago   Sleep apnea    on CPAP   Stroke [facility])    left sided weakness, speech changes   Tubular adenoma of colon       Past Surgical History:  Procedure Laterality Date   ABDOMINAL HYSTERECTOMY  1978   BACK SURGERY  July 2012   BACTERIAL OVERGROWTH TEST N/A 05/05/2013   Procedure: BACTERIAL OVERGROWTH TEST;  Surgeon: Daneil Dolin, MD;  [facility];   Service: Endoscopy;  Laterality: N/A;  7:30   BIOPSY THYROID  2009   BRAIN SURGERY  11/2011   resection of meningioma   BREAST REDUCTION SURGERY  1994   CARDIAC CATHETERIZATION  05/10/2005   normal coronaries, normal LV systolic function and EF (Dr. Jackie Plum)   [place] Left 07/22/04   Dr. Aline Brochure   CATARACT EXTRACTION Bilateral    CHOLECYSTECTOMY  1984   COLONOSCOPY N/A 09/25/2012   [id number] diverticulosis.  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 Hiatal hernia   GIVENS CAPSULE STUDY N/A 01/15/2013   NORMAL.    IR GENERIC HISTORICAL  03/17/2016   IR RADIOLOGIST EVAL & MGMT 03/17/2016 [facility]-INTER[facility] RAD   LESION REMOVAL N/A 05/31/2015   Procedure: REMOVAL RIGHT AND LEFT LESIONS OF MANDIBLE;  Surgeon: Diona Browner, DDS;  Location: [place];  Service: Oral Surgery;  Laterality: N/A;   MALONEY DILATION  12/29/2010   RMR;   NM MYOCAR PERF WALL MOTION  2006   "relavtiely normal" persantine, mild anterior thinning (breast attenuation artifact), no region of scar/ischemia   OVARIAN CYST REMOVAL     RECTOCELE REPAIR N/A 06/29/2015   Procedure: POSTERIOR REPAIR (RECTOCELE);  Surgeon: Jonnie Kind, MD;  Location: [facility];  Service: Gynecology;  Laterality: N/A;   REDUCTION MAMMAPLASTY Bilateral    SPINE SURGERY  09/29/2010   Dr. Rolena Infante   surgical excision of 3 tumors from right thigh and right buttock  and left upper thigh  2010   TOOTH EXTRACTION Bilateral 12/14/2014   Procedure: REMOVAL OF BILATERAL  MANDIBULAR EXOSTOSES;  Surgeon: Diona Browner, DDS;  Location: [place];  Service: Oral Surgery;  Laterality: Bilateral;   TRANSTHORACIC ECHOCARDIOGRAM  2010   EF 60-65%, mild conc LVH, grade 1 diastolic dysfunction; mildly calcified MV annulus with mildly thickened leaflets, mildly calcified MR annulus      Chief Complaint  Patient presents with   Abdominal Pain      HPI:    [name]  is a 68 y.o.  female with past medical history relevant for HTN, depression, DM 2, hypothyroidism, COPD/tobacco abuse, GERD and HLD as well as chronic pain syndrome who presents to the ED with abdominal pain and vomiting since 12/03/2018  -Patient is status post prior hysterectomy and prior open cholecystectomy she has history of recurrent SBO---  Patient had emesis starting 12/03/2018 without blood or bile- --initially denied fevers or chills but post hospitalization patient was found to have a fever with temp above 101  -No productive cough, no urinary symptoms -Abdominal pain is generalized -Large BM 12/03/2018   3 small formed BMs -  In ED---  CT abd/Pelvis with Small bowel obstruction with evidence of transition zone and proximal ileal region. -UA with glucosuria and proteinuria without evidence of UTI WBC-10.6 -Potassium is 3.3, sodium is 141, BUN/creatinine 19 and 0.85   Review of systems:    In addition to the HPI above,   A full Review of  Systems was done, all other systems reviewed are negative except as noted above in HPI , .   Social History:  Reviewed by me   Social History   Tobacco Use   Smoking status: Current Every Day Smoker    Packs/day: 0.50    Years: 30.00    Pack years: 15.00    Types: Cigarettes   Smokeless tobacco: Never Used   Tobacco comment: 5-10 cigs a day  Substance Use Topics   Alcohol use: No    Alcohol/week: 0.0 standard drinks    Comment:       Family History :  Reviewed by me    Family History  Problem Relation Age of Onset   Heart attack  Mother        HTN   Pneumonia Father    Kidney failure Father    Diabetes Father    Pancreatic cancer Sister    Diabetes Brother    Hypertension Brother    Diabetes Brother    Cancer Sister        breast    Hypertension Son    Sleep apnea Son    Cancer Sister        pancreatic   Stroke Maternal Grandmother    Heart attack Maternal Grandfather    Alcohol abuse Maternal Uncle    Colon cancer Neg Hx    Anesthesia problems Neg Hx    Hypotension Neg Hx    Malignant hyperthermia Neg Hx    Pseudochol deficiency Neg Hx    Breast cancer Neg Hx      Home Medications:   Prior to Admission medications   Medication Sig Start Date End Date Taking? Authorizing Provider  albuterol (VENTOLIN HFA) 108 (90 Base) MCG/ACT inhaler Inhale 1-2 puffs every 6 hours as needed for wheezing, shortness of breath 07/05/18  Yes Young, Clinton D, MD  amLODipine (NORVASC) 10 MG tablet Take 1 tablet (10 mg total) by mouth daily. 03/14/18 12/04/18 Yes Strader, Fransisco Hertz, PA-C  ascorbic acid (VITAMIN C) 500 MG tablet Take 500 mg by mouth daily.   Yes [provider]  aspirin EC 81 MG tablet Take 81 mg by mouth daily.   Yes [provider]  cetirizine (ZYRTEC) 10 MG tablet Take 1 tablet (10 mg total) by mouth daily. 07/30/17  Yes [name], MD  Cholecalciferol (VITAMIN D3) 5000 units CAPS Take 1 capsule by mouth daily.   Yes [provider]  clobetasol cream (TEMOVATE) [id number] % Apply 1 application topically 2 (two) times daily.  07/30/18  Yes [provider]  insulin aspart (NOVOLOG FLEXPEN) 100 UNIT/ML FlexPen INJECT 10 TO 14 UNITS INTO THE SKIN 3 (THREE) TIMES DAILY WITH MEALS. 06/07/17  Yes Philemon Kingdom, MD  Insulin Glargine (TOUJEO SOLOSTAR) 300 UNIT/ML SOPN Inject 20 Units into the skin at bedtime. 06/07/17  Yes Philemon Kingdom, MD  lamoTRIgine (LAMICTAL) 100 MG tablet Take 1 tablet (100 mg total) by mouth 2 (two) times daily. 06/05/18  Yes Cloria Spring, MD  levothyroxine (SYNTHROID) 50 MCG tablet TAKE 1 TABLET  DAILY  EXCEPT TAKE 1/2 TABLET  ON  SUNDAY 09/19/18  Yes [name], MD  LORazepam (ATIVAN) 0.5 MG tablet Take 1 tablet (0.5 mg total) by mouth 2 (two) times daily. 12/03/18 12/03/19 Yes Cloria Spring, MD  losartan (COZAAR) 50 MG tablet TAKE 1 TABLET EVERY DAY 09/19/18  Yes [name], MD  metaxalone (SKELAXIN) 800 MG tablet metaxalone 800 mg tablet  take 1 tablet by mouth 3 times daily as needed.   Yes [provider]  metoprolol tartrate (LOPRESSOR) 50 MG tablet TAKE 1 TABLET TWICE DAILY (NEED MD APPOINTMENT) 03/15/18  Yes Herminio Commons, MD  montelukast (SINGULAIR) 10 MG tablet TAKE 1 TABLET EVERY DAY 09/19/18  Yes [name], MD  Multiple Vitamins-Minerals (HM MULTIVITAMIN ADULT GUMMY PO) Take 1 tablet by mouth daily.   Yes [provider]  nystatin (MYCOSTATIN/NYSTOP) powder Apply to affected areas four times daily as needed. 09/16/18  Yes [name], MD  oxyCODONE-acetaminophen (PERCOCET) 10-325 MG tablet  08/06/18  Yes [provider]  pregabalin (LYRICA) 75 MG capsule Take 1 capsule (75 mg total) by mouth daily. 10/20/16  Yes Rexene Alberts, MD  RABEprazole (ACIPHEX) 20 MG tablet TAKE 1 TABLET TWICE DAILY 11/04/18  Yes Esterwood, Amy S, PA-C  RESTASIS 0.05 % ophthalmic emulsion Place 1 drop into both eyes 2 (two) times daily.  02/04/14  Yes [provider]  risperiDONE (RISPERDAL) 0.5 MG tablet Take 1 tablet (0.5 mg total) by mouth at bedtime. 09/10/18  Yes Cloria Spring, MD  rosuvastatin (CRESTOR) 5 MG tablet TAKE 1 TABLET AT BEDTIME 11/11/18  Yes [name], MD  sertraline (ZOLOFT) 100 MG tablet Take 1 tablet (100 mg total) by mouth daily. 09/10/18  Yes Cloria Spring, MD  Tiotropium Bromide-Olodaterol (STIOLTO RESPIMAT) 2.5-2.5 MCG/ACT AERS Inhale 2 puffs into the lungs daily. 07/05/18  Yes Young, Tarri Fuller D, MD  traZODone (DESYREL) 100 MG tablet  Take 1 tablet (100 mg total) by mouth at bedtime. 11/05/18  Yes Cloria Spring, MD  triamcinolone ointment (KENALOG) 0.1 % Apply 1 application topically at bedtime. Apply to elbows at bedtime.   Yes [provider]     Allergies:     Allergies  Allergen Reactions   Cephalexin Hives   Iron Nausea And Vomiting   Milk-Related Compounds Other (See Comments)    Doesn't agree with stomach.    Penicillins Hives    Has patient had a PCN reaction causing immediate rash, facial/tongue/throat swelling, SOB or lightheadedness with hypotension: Yes Has patient had a PCN reaction causing severe rash involving mucus membranes or skin necrosis: No Has patient had a PCN reaction that required hospitalization No Has patient had a PCN reaction occurring within the last 10 years: No If all of the above answers are "NO", then may proceed with Cephalosporin use.    Phenazopyridine Hcl Hives           Physical Exam:   Vitals  Blood pressure (!) 181/62, pulse 96, temperature (!) 101.3 F (38.5 C), temperature source Oral, resp. rate 18, height 5\' 1"  (1.549 m), weight 78.5 kg, SpO2 100 %.  Physical Examination: General appearance - alert, well appearing, and in no distress and  Nose--NG tube with gastric contents Mental status - alert, oriented to person, place, and time,  Eyes - sclera anicteric Neck - supple, no JVD elevation , Chest - clear  to auscultation bilaterally, symmetrical air movement,  Heart - S1 and S2 normal, regular  Abdomen -generalized abdominal discomfort initially slightly distended,, prior hysterectomy and cholecystectomy scars noted, diminished bowel sounds Neurological - screening mental status exam normal, neck supple without rigidity, cranial nerves II through XII intact, DTR's normal and symmetric Extremities - no pedal edema noted, intact peripheral pulses  Skin - warm, dry     Data Review:    CBC Recent Labs  Lab 12/04/18 0617  WBC 10.6*  HGB  11.6*  HCT 41.5  PLT 294  MCV 78.9*  MCH 22.1*  MCHC 28.0*  RDW 15.1  LYMPHSABS 1.2  MONOABS 0.6  EOSABS 0.2  BASOSABS 0.1   ------------------------------------------------------------------------------------------------------------------  Chemistries  Recent Labs  Lab 12/04/18 0617  NA 141  K 3.3*  CL 104  CO2 25  GLUCOSE 172*  BUN 19  CREATININE 0.85  CALCIUM 9.4  AST 20  ALT 23  ALKPHOS 97  BILITOT 0.3   ------------------------------------------------------------------------------------------------------------------ estimated creatinine clearance is 60.1 mL/min (by C-G formula based on SCr of 0.85 mg/dL). ------------------------------------------------------------------------------------------------------------------ No results for input(s): TSH, T4TOTAL, T3FREE, THYROIDAB in the last 72 hours.  Invalid input(s): FREET3   Coagulation profile No results for input(s): INR, PROTIME in the last 168 hours. ------------------------------------------------------------------------------------------------------------------- No results for input(s): DDIMER in the last 72 hours. -------------------------------------------------------------------------------------------------------------------  Cardiac Enzymes No results for input(s): CKMB, TROPONINI, MYOGLOBIN in the last 168 hours.  Invalid input(s): CK ------------------------------------------------------------------------------------------------------------------    Component Value Date/Time   BNP 55.0 05/19/2017 1023     ---------------------------------------------------------------------------------------------------------------  Urinalysis    Component Value Date/Time   COLORURINE STRAW (A) 12/04/2018 0617   APPEARANCEUR CLEAR 12/04/2018 0617   LABSPEC 1.030 12/04/2018 0617   PHURINE 6.0 12/04/2018 0617   GLUCOSEU 150 (A) 12/04/2018 0617   HGBUR NEGATIVE 12/04/2018 0617   HGBUR negative 03/29/2010  0834   BILIRUBINUR NEGATIVE 12/04/2018 0617   BILIRUBINUR negative 09/30/2018 1109   BILIRUBINUR negative 10/08/2017 1143   KETONESUR NEGATIVE 12/04/2018 0617   PROTEINUR 30 (A) 12/04/2018 0617   UROBILINOGEN 0.2 09/30/2018 1109   UROBILINOGEN 0.2 06/02/2014 0140   NITRITE NEGATIVE 12/04/2018 0617   LEUKOCYTESUR NEGATIVE 12/04/2018 0617    ----------------------------------------------------------------------------------------------------------------   Imaging Results:    Ct Abdomen Pelvis W Contrast  Result Date: 12/04/2018 CLINICAL DATA:  Abdominal distension and nausea EXAM: CT ABDOMEN AND PELVIS WITH CONTRAST TECHNIQUE: Multidetector CT imaging of the abdomen and pelvis was performed using the standard protocol following bolus administration of intravenous contrast. CONTRAST:  165mL OMNIPAQUE IOHEXOL 300 MG/ML  SOLN COMPARISON:  June 28, 2017 FINDINGS: Lower chest: There is bibasilar atelectatic change, more on the left than on the right. There is no frank airspace consolidation in the bases. There is a small hiatal hernia. Hepatobiliary: No focal liver lesions are evident. The gallbladder is absent. There is no biliary duct dilatation. Pancreas: No pancreatic mass or inflammatory focus. Spleen: No splenic lesions are evident. Adrenals/Urinary Tract: Adrenals bilaterally appear unremarkable. Kidneys bilaterally show no evident mass or hydronephrosis on either side. No evident renal  or ureteral calculus on either side. Urinary bladder is midline with wall thickness within normal limits. Stomach/Bowel: The stomach is mildly distended with fluid and air. There is dilatation of much of the small bowel with transition zone in the anterior right abdomen in the proximal ileal region consistent with a degree of small bowel obstruction. Terminal ileum appears normal. There is no free air or portal venous air. No evident diverticulitis. Colon is largely decompressed. Vascular/Lymphatic: There is aortic  and iliac artery atherosclerosis. No aneurysm evident. No adenopathy evident in the abdomen or pelvis. Reproductive: Uterus absent.  No evident pelvic mass. Other: Appendix appears normal. No abscess or ascites is evident in the abdomen or pelvis. Musculoskeletal: There is degenerative change in the lower thoracic and lumbar regions. There are no blastic or lytic bone lesions. There is no intramuscular or abdominal wall lesions. IMPRESSION: 1. Small bowel obstruction with evidence of transition zone and proximal ileal region. No free air. Terminal ileum appears normal. 2.  Small hiatal hernia. 3.  No abscess in the abdomen or pelvis.  Appendix appears normal. 4.  Gallbladder absent.  Uterus absent. 5. No renal or ureteral calculus. No hydronephrosis. Urinary bladder wall thickness within normal limits. Electronically Signed   By: Lowella Grip III M.D.   On: 12/04/2018 09:06   Dg Chest Port 1 View  Result Date: 12/04/2018 CLINICAL DATA:  Status post NG tube placement. EXAM: PORTABLE CHEST 1 VIEW COMPARISON:  None. FINDINGS: This exam is limited by the patient's body habitus and portable technique. NG tube is visualized to the level of the mid to distal esophagus. Lungs are clear. Heart size is normal. IMPRESSION: Limited examination due to the patient's size and portable technique. NG tube is visualized to the mid to distal esophagus. Consider plain film of the abdomen for further assessment. Electronically Signed   By: Inge Rise M.D.   On: 12/04/2018 13:06   Dg Abd 2 Views  Result Date: 12/04/2018 CLINICAL DATA:  Acute abdominal swelling EXAM: ABDOMEN - 2 VIEW COMPARISON:  Abdominal CT 06/28/2017 FINDINGS: Gas distended stomach and small bowel loops with a paucity of colonic gas. Small bowel loops measure up to 3.5 cm diameter. No convincing pneumoperitoneum when allowing for probable gastric gas at the level of the epigastrium. Cholecystectomy clips. IMPRESSION: Gastric and small bowel  distension favoring obstruction. Electronically Signed   By: Monte Fantasia M.D.   On: 12/04/2018 05:54    Radiological Exams on Admission: Ct Abdomen Pelvis W Contrast  Result Date: 12/04/2018 CLINICAL DATA:  Abdominal distension and nausea EXAM: CT ABDOMEN AND PELVIS WITH CONTRAST TECHNIQUE: Multidetector CT imaging of the abdomen and pelvis was performed using the standard protocol following bolus administration of intravenous contrast. CONTRAST:  163mL OMNIPAQUE IOHEXOL 300 MG/ML  SOLN COMPARISON:  June 28, 2017 FINDINGS: Lower chest: There is bibasilar atelectatic change, more on the left than on the right. There is no frank airspace consolidation in the bases. There is a small hiatal hernia. Hepatobiliary: No focal liver lesions are evident. The gallbladder is absent. There is no biliary duct dilatation. Pancreas: No pancreatic mass or inflammatory focus. Spleen: No splenic lesions are evident. Adrenals/Urinary Tract: Adrenals bilaterally appear unremarkable. Kidneys bilaterally show no evident mass or hydronephrosis on either side. No evident renal or ureteral calculus on either side. Urinary bladder is midline with wall thickness within normal limits. Stomach/Bowel: The stomach is mildly distended with fluid and air. There is dilatation of much of the small bowel with transition zone  in the anterior right abdomen in the proximal ileal region consistent with a degree of small bowel obstruction. Terminal ileum appears normal. There is no free air or portal venous air. No evident diverticulitis. Colon is largely decompressed. Vascular/Lymphatic: There is aortic and iliac artery atherosclerosis. No aneurysm evident. No adenopathy evident in the abdomen or pelvis. Reproductive: Uterus absent.  No evident pelvic mass. Other: Appendix appears normal. No abscess or ascites is evident in the abdomen or pelvis. Musculoskeletal: There is degenerative change in the lower thoracic and lumbar regions. There are no  blastic or lytic bone lesions. There is no intramuscular or abdominal wall lesions. IMPRESSION: 1. Small bowel obstruction with evidence of transition zone and proximal ileal region. No free air. Terminal ileum appears normal. 2.  Small hiatal hernia. 3.  No abscess in the abdomen or pelvis.  Appendix appears normal. 4.  Gallbladder absent.  Uterus absent. 5. No renal or ureteral calculus. No hydronephrosis. Urinary bladder wall thickness within normal limits. Electronically Signed   By: Lowella Grip III M.D.   On: 12/04/2018 09:06   Dg Chest Port 1 View  Result Date: 12/04/2018 CLINICAL DATA:  Status post NG tube placement. EXAM: PORTABLE CHEST 1 VIEW COMPARISON:  None. FINDINGS: This exam is limited by the patient's body habitus and portable technique. NG tube is visualized to the level of the mid to distal esophagus. Lungs are clear. Heart size is normal. IMPRESSION: Limited examination due to the patient's size and portable technique. NG tube is visualized to the mid to distal esophagus. Consider plain film of the abdomen for further assessment. Electronically Signed   By: Inge Rise M.D.   On: 12/04/2018 13:06   Dg Abd 2 Views  Result Date: 12/04/2018 CLINICAL DATA:  Acute abdominal swelling EXAM: ABDOMEN - 2 VIEW COMPARISON:  Abdominal CT 06/28/2017 FINDINGS: Gas distended stomach and small bowel loops with a paucity of colonic gas. Small bowel loops measure up to 3.5 cm diameter. No convincing pneumoperitoneum when allowing for probable gastric gas at the level of the epigastrium. Cholecystectomy clips. IMPRESSION: Gastric and small bowel distension favoring obstruction. Electronically Signed   By: Monte Fantasia M.D.   On: 12/04/2018 05:54    DVT Prophylaxis -SCD/Heparin AM Labs Ordered, also please review Full Orders  Family Communication: Admission, patients condition and plan of care including tests being ordered have been discussed with the patient  who indicate understanding  and agree with the plan   Code Status - Full Code  Likely DC to  Home   Condition   stable  Roxan Hockey M.D on 12/04/2018 at 6:22 PM Go to [email or web address] -  for contact info  [facility] - Office  [phone]

## 2018-12-04 NOTE — ED Provider Notes (Signed)
[facility] EMERGENCY DEPARTMENT Provider Note   CSN: [id number] Arrival date & time: 12/04/18  0405   Time seen 4:21 AM  History   Chief Complaint Chief Complaint  Patient presents with   Abdominal Pain    HPI [name] is a 68 y.o. female.     HPI patient states about 9 PM this evening she felt like her abdomen was swelling.  She had nausea but was unable to vomit.  She describes the pain is cramping and diffuse.  She states she had 3 BMs yesterday that were long and skinny and formed but were "dark".  She denies any fever.  She denies any change in her eating habits.  She has had decreased appetite today.  She states she has had this before when she had a bowel blockage and it was treated with an NG tube.  She is status post hysterectomy and cholecystectomy.  PCP [name], MD   Past Medical History:  Diagnosis Date   Allergy    Anemia    Anxiety    takes Ativan daily   Arthritis    Assistance needed for mobility    Bipolar disorder [facility])    takes Risperdal nightly   Blood transfusion    Cancer ([place])    In her gum   Carpal tunnel syndrome of right wrist 05/23/2011   Cervical disc disorder with radiculopathy of cervical region 10/31/2012   Chronic back pain    Chronic idiopathic constipation    Chronic neck and back pain    Colon polyps    COPD (chronic obstructive pulmonary disease) with chronic bronchitis ([place]) 09/16/2013   Office Spirometry 10/30/2013-submaximal effort based on appearance of loop and curve. Numbers would fit with severe restriction but her physiologic capability may be better than this. FVC 0.91/44%, and 10.74/45%, FEV1/FVC 0.81, FEF 25-75% 1.43/69%     Depression    takes Zoloft daily   Diabetes mellitus    Type II   Diverticulosis    TCS 9/08 by Dr. Delfin Edis for diarrhea . Bx for micro scopic colitis negative.    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   Metabolic encephalopathy [id number]   Migraines    chronic headaches   Mononeuritis lower limb    Narcolepsy    Osteoporosis    Pancreatitis 2006   due to Depakote with normal EUS    Schatzki's ring    non critical / EGD with ED 8/2011with RMR   Seizures ([place])    takes Lamictal daily.Last seizure 3 yrs ago   Sleep apnea    on CPAP   Stroke [facility])    left sided weakness, speech changes   Tubular adenoma of colon     Patient Active Problem List   Diagnosis Date Noted   Benign neoplasm of cerebral meninges ([place]) 11/12/2018   Benign meningioma of brain ([place]) 10/31/2018   Osteoarthritis of both hips 07/06/2018   Left arm pain 06/11/2018  Lipoma of extremity 12/31/2017   Impingement syndrome of left shoulder region 12/27/2017   Leg weakness, bilateral 10/28/2017   Lumbar spondylosis with myelopathy 10/12/2017   Numbness of hand 10/10/2017   Chronic pain syndrome 07/25/2017   At risk for cardiovascular event 06/10/2017   Rash 04/25/2017   Diabetic polyneuropathy associated with type 2 diabetes mellitus ([place]) 05/19/2016   Tobacco user 04/24/2016   History of palpitations 08/09/2015   Nausea 08/09/2015   Labile hypertension 08/03/2015   Normal coronary arteries 08/03/2015   Left-sided low back pain with left-sided sciatica 06/27/2015   Type 2 diabetes mellitus with hyperglycemia, with long-term current use of insulin ([place]) 05/06/2015   Multinodular goiter 05/06/2015   Rectocele, female 04/27/2015   Anal sphincter incontinence  04/27/2015   Pelvic relaxation due to rectocele 03/30/2015   Pulmonary hypertension ([place]) 02/22/2015   Light cigarette smoker (1-9 cigarettes per day) 01/11/2015   Migraine without aura and without status migrainosus, not intractable 07/02/2014   Flatulence 02/18/2014   Microcytic anemia 02/18/2014   COPD mixed type ([place]) 09/16/2013   Hypothyroidism 08/16/2013   Gastroparesis 04/28/2013   Seizure disorder ([place]) 01/19/2013   Cervical disc disorder with radiculopathy of cervical region 10/31/2012   Solitary pulmonary nodule 08/19/2012   Anemia 07/05/2012   Hypersomnia disorder related to a known organic factor 06/11/2012   Meningioma ([place]) 11/19/2011   Mononeuritis leg 10/25/2011   Carpal tunnel syndrome of right wrist 05/23/2011   Polypharmacy 04/28/2011   Bipolar disorder ([place]) 04/28/2011   Constipation 04/13/2011   Falls frequently 12/12/2010   Oropharyngeal dysphagia 07/12/2010   Urinary incontinence 12/16/2009   HEARING LOSS 10/26/2009   Hyperlipidemia 12/11/2008   IBS 12/11/2008   GERD 07/29/2008   MILK PRODUCTS ALLERGY 07/29/2008   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/03/2007   Essential hypertension 06/27/2007   Backache 06/19/2007   Osteoporosis 06/19/2007   Obstructive sleep apnea 06/19/2007   TRIGGER FINGER 04/18/2007   DIVERTICULOSIS, COLON 11/13/2006    Past Surgical History:  Procedure Laterality Date   ABDOMINAL HYSTERECTOMY  1978   BACK SURGERY  July 2012   BACTERIAL OVERGROWTH TEST N/A 05/05/2013   Procedure: BACTERIAL OVERGROWTH TEST;  Surgeon: Daneil Dolin, MD;  [facility];  Service: Endoscopy;  Laterality: N/A;  7:30   BIOPSY THYROID  2009   BRAIN SURGERY  11/2011   resection of meningioma   BREAST REDUCTION SURGERY  1994   CARDIAC CATHETERIZATION  05/10/2005   normal coronaries, normal LV systolic function and EF (Dr. Jackie Plum)   [place] Left 07/22/04    Dr. Aline Brochure   CATARACT EXTRACTION Bilateral    CHOLECYSTECTOMY  1984   COLONOSCOPY N/A 09/25/2012   [id number] diverticulosis.  colonic polyp-removed : tubular adenoma   CRANIOTOMY  11/23/2011   Procedure: CRANIOTOMY TUMOR EXCISION;  Surgeon: Hosie Spangle, MD;  Location: [place] NEURO ORS;  Service: Neurosurgery;  Laterality: N/A;  Craniotomy for tumor resection   ESOPHAGOGASTRODUODENOSCOPY  12/29/2010   [place]-Retained food in the esophagus and stomach, small hiatal hernia, status post Maloney dilation of the esophagus   ESOPHAGOGASTRODUODENOSCOPY N/A 09/25/2012   [id number] atonic baggy esophagus status post Maloney dilation 63 F. Hiatal hernia   GIVENS CAPSULE STUDY N/A 01/15/2013   NORMAL.    IR GENERIC HISTORICAL  03/17/2016   IR RADIOLOGIST EVAL & MGMT 03/17/2016 [facility]-INTERV RAD   LESION REMOVAL N/A 05/31/2015   Procedure: REMOVAL RIGHT AND LEFT LESIONS OF MANDIBLE;  Surgeon: Diona Browner,  DDS;  Location: [place];  Service: Oral Surgery;  Laterality: N/A;   MALONEY DILATION  12/29/2010   RMR;   NM MYOCAR PERF WALL MOTION  2006   "relavtiely normal" persantine, mild anterior thinning (breast attenuation artifact), no region of scar/ischemia   OVARIAN CYST REMOVAL     RECTOCELE REPAIR N/A 06/29/2015   Procedure: POSTERIOR REPAIR (RECTOCELE);  Surgeon: Jonnie Kind, MD;  Location: [facility] ORS;  Service: Gynecology;  Laterality: N/A;   REDUCTION MAMMAPLASTY Bilateral    SPINE SURGERY  09/29/2010   Dr. Rolena Infante   surgical excision of 3 tumors from right thigh and right buttock  and left upper thigh  2010   TOOTH EXTRACTION Bilateral 12/14/2014   Procedure: REMOVAL OF BILATERAL MANDIBULAR EXOSTOSES;  Surgeon: Diona Browner, DDS;  Location: [place];  Service: Oral Surgery;  Laterality: Bilateral;   TRANSTHORACIC ECHOCARDIOGRAM  2010   EF 60-65%, mild conc LVH, grade 1 diastolic dysfunction; mildly calcified MV annulus with mildly thickened leaflets, mildly calcified MR annulus      OB History    Gravida  6   Para  1   Term  1   Preterm      AB  5   Living        SAB  5   TAB      Ectopic      Multiple      Live Births               Home Medications    Prior to Admission medications   Medication Sig Start Date End Date Taking? Authorizing Provider  ACCU-CHEK FASTCLIX LANCETS MISC Use to check blood sugar 4 times a day 02/18/18   Philemon Kingdom, MD  albuterol (VENTOLIN HFA) 108 (90 Base) MCG/ACT inhaler Inhale 1-2 puffs every 6 hours as needed for wheezing, shortness of breath 07/05/18   Baird Lyons D, MD  amLODipine (NORVASC) 10 MG tablet Take 1 tablet (10 mg total) by mouth daily. 03/14/18 09/30/18  Erma Heritage, PA-C  ascorbic acid (VITAMIN C) 500 MG tablet Take 500 mg by mouth daily.    [provider]  aspirin EC 81 MG tablet Take 81 mg by mouth daily.    [provider]  Blood Glucose Monitoring Suppl (ACCU-CHEK AVIVA) device Use as instructed 12/18/17 12/18/18  Philemon Kingdom, MD  cetirizine (ZYRTEC) 10 MG tablet Take 1 tablet (10 mg total) by mouth daily. 07/30/17   [name], MD  Cholecalciferol (VITAMIN D3) 5000 units CAPS Take 1 capsule by mouth daily.    [provider]  clobetasol cream (TEMOVATE) 0.05 %  07/30/18   [provider]  clotrimazole-betamethasone (LOTRISONE) cream Apply sparingly twice daily to rash on face for 1 week, then only as needed 07/03/18   [name], MD  diclofenac sodium (VOLTAREN) 1 % GEL Apply 2 g topically 4 (four) times daily. 02/21/18   Esterwood, Amy S, PA-C  DROPLET PEN NEEDLES 32G X 4 MM MISC USE  TO INJECT FOUR TIMES DAILY 04/01/18   Philemon Kingdom, MD  glucose blood (ACCU-CHEK AVIVA) test strip Use to check blood sugar 4 times a day 03/07/18   Philemon Kingdom, MD  hydrOXYzine (ATARAX/VISTARIL) 10 MG tablet Take one tablet by mouth, at bedtime, as needed, for itch 10/18/18   [name], MD  insulin aspart (NOVOLOG FLEXPEN)  100 UNIT/ML FlexPen INJECT 10 TO 14 UNITS INTO THE SKIN 3 (THREE) TIMES DAILY WITH MEALS. 06/07/17  Philemon Kingdom, MD  Insulin Glargine (TOUJEO SOLOSTAR) 300 UNIT/ML SOPN Inject 20 Units into the skin at bedtime. 06/07/17   Philemon Kingdom, MD  lamoTRIgine (LAMICTAL) 100 MG tablet Take 1 tablet (100 mg total) by mouth 2 (two) times daily. 06/05/18   Cloria Spring, MD  levothyroxine (SYNTHROID) 50 MCG tablet TAKE 1 TABLET  DAILY  EXCEPT TAKE 1/2 TABLET  ON  SUNDAY 09/19/18   [name], MD  LORazepam (ATIVAN) 0.5 MG tablet Take 1 tablet (0.5 mg total) by mouth 2 (two) times daily. 12/03/18 12/03/19  Cloria Spring, MD  losartan (COZAAR) 50 MG tablet TAKE 1 TABLET EVERY DAY 09/19/18   [name], MD  metaxalone (SKELAXIN) 800 MG tablet metaxalone 800 mg tablet  take 1 tablet by mouth 3 times daily as needed.    [provider]  metoprolol tartrate (LOPRESSOR) 50 MG tablet TAKE 1 TABLET TWICE DAILY (NEED MD APPOINTMENT) 03/15/18   Herminio Commons, MD  montelukast (SINGULAIR) 10 MG tablet TAKE 1 TABLET EVERY DAY 09/19/18   [name], MD  Multiple Vitamins-Minerals (HM MULTIVITAMIN ADULT GUMMY PO) Take 1 tablet by mouth daily.    [provider]  nystatin (MYCOSTATIN/NYSTOP) powder Apply to affected areas four times daily as needed. 09/16/18   [name], MD  ondansetron (ZOFRAN) 4 MG tablet Take 1 tablet by mouth every 6 hours as needed for nausea. 02/21/18   Esterwood, Amy S, PA-C  oxyCODONE-acetaminophen (PERCOCET) 10-325 MG tablet  08/06/18   [provider]  pregabalin (LYRICA) 75 MG capsule Take 1 capsule (75 mg total) by mouth daily. 10/20/16   Rexene Alberts, MD  RABEprazole (ACIPHEX) 20 MG tablet TAKE 1 TABLET TWICE DAILY 11/04/18   Esterwood, Amy S, PA-C  RESTASIS 0.05 % ophthalmic emulsion Place 1 drop into both eyes 2 (two) times daily.  02/04/14   [provider]  risperiDONE (RISPERDAL) 0.5 MG tablet Take 1 tablet (0.5 mg  total) by mouth at bedtime. 09/10/18   Cloria Spring, MD  rosuvastatin (CRESTOR) 5 MG tablet TAKE 1 TABLET AT BEDTIME 11/11/18   [name], MD  sertraline (ZOLOFT) 100 MG tablet Take 1 tablet (100 mg total) by mouth daily. 09/10/18   Cloria Spring, MD  Tiotropium Bromide-Olodaterol (STIOLTO RESPIMAT) 2.5-2.5 MCG/ACT AERS Inhale 2 puffs into the lungs daily. 07/05/18   Deneise Lever, MD  TOUJEO SOLOSTAR 300 UNIT/ML SOPN INJECT 20 UNITS INTO THE SKIN AT BEDTIME. 06/17/18   Philemon Kingdom, MD  traZODone (DESYREL) 100 MG tablet Take 1 tablet (100 mg total) by mouth at bedtime. 11/05/18   Cloria Spring, MD  triamcinolone ointment (KENALOG) 0.1 % Apply 1 application topically at bedtime. Apply to elbows at bedtime.    [provider]  umeclidinium-vilanterol (ANORO ELLIPTA) 62.5-25 MCG/INH AEPB Inhale 1 puff into the lungs daily. 06/18/18   Deneise Lever, MD  UNABLE TO FIND Transport Chair x 1  Dx-bilateral leg weakness, frequent falls Patient not taking: Reported on 10/25/2018 11/20/17   [name], MD  UNABLE TO FIND Rolling walker with seat x 1  DX. Bilateral lower extremity weakness, frequent falls, instability 11/20/17   [name], MD  UNABLE TO FIND Lift chair x 1  DX M47.16, R29.6 05/14/18   [name], MD    Family History Family History  Problem Relation Age of Onset   Heart attack Mother        HTN  Pneumonia Father    Kidney failure Father    Diabetes Father    Pancreatic cancer Sister    Diabetes Brother    Hypertension Brother    Diabetes Brother    Cancer Sister        breast    Hypertension Son    Sleep apnea Son    Cancer Sister        pancreatic   Stroke Maternal Grandmother    Heart attack Maternal Grandfather    Alcohol abuse Maternal Uncle    Colon cancer Neg Hx    Anesthesia problems Neg Hx    Hypotension Neg Hx    Malignant hyperthermia Neg Hx    Pseudochol deficiency Neg Hx    Breast  cancer Neg Hx     Social History Social History   Tobacco Use   Smoking status: Current Every Day Smoker    Packs/day: 0.50    Years: 30.00    Pack years: 15.00    Types: Cigarettes   Smokeless tobacco: Never Used   Tobacco comment: 5-10 cigs a day  Substance Use Topics   Alcohol use: No    Alcohol/week: 0.0 standard drinks    Comment:     Drug use: No  uses a cane   Allergies   Cephalexin, Iron, Milk-related compounds, Penicillins, and Phenazopyridine hcl   Review of Systems Review of Systems  All other systems reviewed and are negative.    Physical Exam Updated Vital Signs BP 117/89    Pulse 87    Temp 98.9 F (37.2 C) (Oral)    Resp 18    Ht 5\' 1"  (1.549 m)    Wt 78.5 kg    SpO2 96%    BMI 32.69 kg/m   Physical Exam Vitals signs and nursing note reviewed.  Constitutional:      General: She is not in acute distress.    Appearance: Normal appearance. She is well-developed. She is not ill-appearing or toxic-appearing.  HENT:     Head: Normocephalic and atraumatic.     Comments: Edentulous    Right Ear: External ear normal.     Left Ear: External ear normal.     Nose: Nose normal. No mucosal edema or rhinorrhea.     Mouth/Throat:     Dentition: No dental abscesses.     Pharynx: Oropharynx is clear. No uvula swelling.  Eyes:     Extraocular Movements: Extraocular movements intact.     Conjunctiva/sclera: Conjunctivae normal.     Pupils: Pupils are equal, round, and reactive to light.  Neck:     Musculoskeletal: Full passive range of motion without pain, normal range of motion and neck supple.  Cardiovascular:     Rate and Rhythm: Normal rate and regular rhythm.     Heart sounds: Normal heart sounds. No murmur. No friction rub. No gallop.   Pulmonary:     Effort: Pulmonary effort is normal. No respiratory distress.     Breath sounds: Normal breath sounds. No wheezing, rhonchi or rales.  Chest:     Chest wall: No tenderness or crepitus.  Abdominal:       General: Bowel sounds are decreased. There is no distension.     Palpations: Abdomen is soft.     Tenderness: There is generalized abdominal tenderness. There is no guarding or rebound.  Musculoskeletal: Normal range of motion.        General: No tenderness.     Comments: Moves all extremities well.  Skin:    General: Skin is warm and dry.     Coloration: Skin is not pale.     Findings: No erythema or rash.  Neurological:     General: No focal deficit present.     Mental Status: She is alert and oriented to person, place, and time.     Cranial Nerves: No cranial nerve deficit.  Psychiatric:        Mood and Affect: Mood normal. Mood is not anxious.        Speech: Speech normal.        Behavior: Behavior normal.        Thought Content: Thought content normal.      ED Treatments / Results  Labs (all labs ordered are listed, but only abnormal results are displayed) Labs Reviewed  COMPREHENSIVE METABOLIC PANEL - Abnormal; Notable for the following components:      Result Value   Potassium 3.3 (*)    Glucose, Bld 172 (*)    All other components within normal limits  CBC WITH DIFFERENTIAL/PLATELET - Abnormal; Notable for the following components:   WBC 10.6 (*)    RBC 5.26 (*)    Hemoglobin 11.6 (*)    MCV 78.9 (*)    MCH 22.1 (*)    MCHC 28.0 (*)    Neutro Abs 8.5 (*)    Abs Immature Granulocytes 0.12 (*)    All other components within normal limits  URINALYSIS, ROUTINE W REFLEX MICROSCOPIC    EKG None  Radiology Dg Abd 2 Views  Result Date: 12/04/2018 CLINICAL DATA:  Acute abdominal swelling EXAM: ABDOMEN - 2 VIEW COMPARISON:  Abdominal CT 06/28/2017 FINDINGS: Gas distended stomach and small bowel loops with a paucity of colonic gas. Small bowel loops measure up to 3.5 cm diameter. No convincing pneumoperitoneum when allowing for probable gastric gas at the level of the epigastrium. Cholecystectomy clips. IMPRESSION: Gastric and small bowel distension favoring  obstruction. Electronically Signed   By: Monte Fantasia M.D.   On: 12/04/2018 05:54    Procedures Procedures (including critical care time)  Medications Ordered in ED Medications  sodium chloride 0.9 % bolus 500 mL (has no administration in time range)  fentaNYL (SUBLIMAZE) injection 50 mcg (has no administration in time range)  ondansetron (ZOFRAN) injection 4 mg (has no administration in time range)  iohexol (OMNIPAQUE) 300 MG/ML solution 100 mL (has no administration in time range)  ondansetron (ZOFRAN-ODT) disintegrating tablet 8 mg (8 mg Oral Given 12/04/18 0539)     Initial Impression / Assessment and Plan / ED Course  I have reviewed the triage vital signs and the nursing notes.  Pertinent labs & imaging results that were available during my care of the patient were reviewed by me and considered in my medical decision making (see chart for details).      When I review her prior ED records patient presented with similar complaints about a year ago and had constipation as her discharge diagnosis.  Therefore I started with a 2 view x-ray.  Review the [place] shows patient got #90 oxycodone 10/325 on September 22.  She is on them on a regular basis and she was increased from 60 tabs a month up to 90 tablets a month in June.  Initial 2 view x-ray was done to look for bowel obstruction.  After reviewing her x-ray patient had IV pain and nausea medicine and laboratory testing done.  CT of the abdomen was done to look for obstruction.  Patient was left at change of shift with Dr. Lajuan Lines had to get the results of her testing.  Final Clinical Impressions(s) / ED Diagnoses   Final diagnoses:  Generalized abdominal pain  Abdominal distension  Nausea and vomiting, intractability of vomiting not specified, unspecified vomiting type    Disposition pending  Rolland Porter, MD, Barbette Or, MD 12/04/18 [phone]

## 2018-12-04 NOTE — Telephone Encounter (Signed)
FYI

## 2018-12-04 NOTE — Telephone Encounter (Signed)
noted 

## 2018-12-04 NOTE — ED Triage Notes (Signed)
[facility] - generalized abd pain since 2100 last night. 3 BMs yesterday.

## 2018-12-04 NOTE — Telephone Encounter (Signed)
Pt called wanting to let dr simpson know that she was in [facility] er with a bowel obstruction and they were admitting her.

## 2018-12-04 NOTE — Consult Note (Signed)
[facility] Consult  Reason for Consult: SBO  Referring Physician:  Dr. Denton Brick   Chief Complaint    Abdominal Pain      HPI: [name] is a 68 y.o. female with a history meningoma, COPD, DM and prior admissions for SBO in 09/2016 and 2010 at least who presents with worsening abdominal pain and nausea./vomiting.  This started yesterday, and she says that she has had a BM on 12/03/18 but that she has been constipated on and off for several days prior.  Given her pain and vomiting, she came to the ED as she has had the prior obstructions.  A CT demonstrated a distended stomach and dilated SB with possible transition in the pelvis.  She has had a prior hysterectomy and open cholecystectomy. Her prior SBO have resolved with NG management.  She says that due to her meningoma that she was unsure if the nausea was real or related to her brain cancer.  She has has had a fever that is being worked up, but is of unknown origin at this time. Her COVID 19 was negative.    Past Medical History:  Diagnosis Date  . Allergy   . Anemia   . Anxiety    takes Ativan daily  . Arthritis   . Assistance needed for mobility   . Bipolar disorder ([place])    takes Risperdal nightly  . Blood transfusion   . Cancer ([place])    In her gum  . Carpal tunnel syndrome of right wrist 05/23/2011  . Cervical disc disorder with radiculopathy of cervical region 10/31/2012  . Chronic back pain   . Chronic idiopathic constipation   . Chronic neck and back pain   . Colon polyps   . COPD (chronic obstructive pulmonary disease) with chronic bronchitis ([place]) 09/16/2013   Office Spirometry 10/30/2013-submaximal effort based on appearance of loop and curve. Numbers would fit with severe restriction but her physiologic capability may be better than this. FVC 0.91/44%, and 10.74/45%, FEV1/FVC 0.81, FEF 25-75% 1.43/69%    . Depression    takes Zoloft daily  . Diabetes mellitus    Type II  . Diverticulosis    TCS  9/08 by Dr. Delfin Edis for diarrhea . Bx for micro scopic colitis negative.   . Fibromyalgia   . Frequent falls   . GERD (gastroesophageal reflux disease)    takes Aciphex daily  . Glaucoma    eye drops daily  . Gum symptoms    infection on antibiotic  . Hemiplegia affecting non-dominant side, post-stroke 08/02/2011  . Hyperlipidemia    takes Crestor daily  . Hypertension    takes Amlodipine,Metoprolol,and Clonidine daily  . Hypothyroidism    takes Synthroid daily  . IBS (irritable bowel syndrome)   . Insomnia    takes Trazodone nightly  . 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 Metabolic encephalopathy [id number]  . Migraines    chronic headaches  . Mononeuritis lower limb   . Narcolepsy   . Osteoporosis   . Pancreatitis 2006   due to Depakote with normal EUS   . Schatzki's ring    non critical / EGD with ED 8/2011with RMR  . Seizures ([place])    takes Lamictal daily.Last seizure 3 yrs ago  . Sleep apnea    on CPAP  . Stroke [facility])    left sided weakness, speech changes  . Tubular adenoma of colon     Past Surgical History:  Procedure Laterality Date  . ABDOMINAL HYSTERECTOMY  [place]  July 2012  . BACTERIAL OVERGROWTH TEST N/A 05/05/2013   Procedure: BACTERIAL OVERGROWTH TEST;  Surgeon: Daneil Dolin, MD;  [facility];  Service: Endoscopy;  Laterality: N/A;  7:30  . BIOPSY THYROID  2009  . BRAIN SURGERY  11/2011   resection of meningioma  . BREAST REDUCTION SURGERY  1994  . CARDIAC CATHETERIZATION  05/10/2005   normal coronaries, normal LV systolic function and EF (Dr. Jackie Plum)  . CARPAL TUNNEL RELEASE Left 07/22/04   Dr. Aline Brochure  . CATARACT EXTRACTION Bilateral   . CHOLECYSTECTOMY  1984  . COLONOSCOPY N/A 09/25/2012   [id number] diverticulosis.  colonic polyp-removed : tubular adenoma  . CRANIOTOMY  11/23/2011   Procedure: CRANIOTOMY TUMOR EXCISION;  Surgeon: Hosie Spangle, MD;  Location: [place] NEURO ORS;  Service: Neurosurgery;   Laterality: N/A;  Craniotomy for tumor resection  . ESOPHAGOGASTRODUODENOSCOPY  12/29/2010   [place]k-Retained food in the esophagus and stomach, small hiatal hernia, status post Maloney dilation of the esophagus  . ESOPHAGOGASTRODUODENOSCOPY N/A 09/25/2012   [id number] atonic baggy esophagus status post Maloney dilation 24 F. Hiatal hernia  . GIVENS CAPSULE STUDY N/A 01/15/2013   NORMAL.   . IR GENERIC HISTORICAL  03/17/2016   IR RADIOLOGIST EVAL & MGMT 03/17/2016 [facility]-INTERV RAD  . LESION REMOVAL N/A 05/31/2015   Procedure: REMOVAL RIGHT AND LEFT LESIONS OF MANDIBLE;  Surgeon: Diona Browner, DDS;  Location: [place];  Service: Oral Surgery;  Laterality: N/A;  . MALONEY DILATION  12/29/2010   RMR;  . NM MYOCAR PERF WALL MOTION  2006   "relavtiely normal" persantine, mild anterior thinning (breast attenuation artifact), no region of scar/ischemia  . OVARIAN CYST REMOVAL    . RECTOCELE REPAIR N/A 06/29/2015   Procedure: POSTERIOR REPAIR (RECTOCELE);  Surgeon: Jonnie Kind, MD;  Location: [facility] ORS;  Service: Gynecology;  Laterality: N/A;  . REDUCTION MAMMAPLASTY Bilateral   . SPINE SURGERY  09/29/2010   Dr. Rolena Infante  . surgical excision of 3 tumors from right thigh and right buttock  and left upper thigh  2010  . TOOTH EXTRACTION Bilateral 12/14/2014   Procedure: REMOVAL OF BILATERAL MANDIBULAR EXOSTOSES;  Surgeon: Diona Browner, DDS;  Location: [place];  Service: Oral Surgery;  Laterality: Bilateral;  . TRANSTHORACIC ECHOCARDIOGRAM  2010   EF 60-65%, mild conc LVH, grade 1 diastolic dysfunction; mildly calcified MV annulus with mildly thickened leaflets, mildly calcified MR annulus    Family History  Problem Relation Age of Onset  . Heart attack Mother        HTN  . Pneumonia Father   . Kidney failure Father   . Diabetes Father   . Pancreatic cancer Sister   . Diabetes Brother   . Hypertension Brother   . Diabetes Brother   . Cancer Sister        breast   . Hypertension Son   . Sleep apnea Son    . Cancer Sister        pancreatic  . Stroke Maternal Grandmother   . Heart attack Maternal Grandfather   . Alcohol abuse Maternal Uncle   . Colon cancer Neg Hx   . Anesthesia problems Neg Hx   . Hypotension Neg Hx   . Malignant hyperthermia Neg Hx   . Pseudochol deficiency Neg Hx   . Breast cancer Neg Hx     Social History   Tobacco Use  . Smoking status: Current Every Day Smoker    Packs/day:  0.50    Years: 30.00    Pack years: 15.00    Types: Cigarettes  . Smokeless tobacco: Never Used  . Tobacco comment: 5-10 cigs a day  Substance Use Topics  . Alcohol use: No    Alcohol/week: 0.0 standard drinks    Comment:    . Drug use: No    Medications:  I have reviewed the patient's current medications. Prior to Admission:  Medications Prior to Admission  Medication Sig Dispense Refill Last Dose  . albuterol (VENTOLIN HFA) 108 (90 Base) MCG/ACT inhaler Inhale 1-2 puffs every 6 hours as needed for wheezing, shortness of breath 3 Inhaler 3 12/03/2018 at Unknown time  . amLODipine (NORVASC) 10 MG tablet Take 1 tablet (10 mg total) by mouth daily. 90 tablet 3 12/03/2018 at Unknown time  . ascorbic acid (VITAMIN C) 500 MG tablet Take 500 mg by mouth daily.   12/03/2018 at Unknown time  . aspirin EC 81 MG tablet Take 81 mg by mouth daily.   12/03/2018 at Unknown time  . cetirizine (ZYRTEC) 10 MG tablet Take 1 tablet (10 mg total) by mouth daily. 7 tablet 0 12/03/2018 at Unknown time  . Cholecalciferol (VITAMIN D3) 5000 units CAPS Take 1 capsule by mouth daily.   12/03/2018 at Unknown time  . clobetasol cream (TEMOVATE) [id number] % Apply 1 application topically 2 (two) times daily.    12/03/2018 at Unknown time  . insulin aspart (NOVOLOG FLEXPEN) 100 UNIT/ML FlexPen INJECT 10 TO 14 UNITS INTO THE SKIN 3 (THREE) TIMES DAILY WITH MEALS. 45 mL 3 12/03/2018 at Unknown time  . Insulin Glargine (TOUJEO SOLOSTAR) 300 UNIT/ML SOPN Inject 20 Units into the skin at bedtime. 9 pen 3 12/03/2018 at Unknown time   . lamoTRIgine (LAMICTAL) 100 MG tablet Take 1 tablet (100 mg total) by mouth 2 (two) times daily. 180 tablet 2 12/03/2018 at Unknown time  . levothyroxine (SYNTHROID) 50 MCG tablet TAKE 1 TABLET  DAILY  EXCEPT TAKE 1/2 TABLET  ON  SUNDAY 85 tablet 1 12/03/2018 at Unknown time  . LORazepam (ATIVAN) 0.5 MG tablet Take 1 tablet (0.5 mg total) by mouth 2 (two) times daily. 180 tablet 2 12/03/2018 at Unknown time  . losartan (COZAAR) 50 MG tablet TAKE 1 TABLET EVERY DAY 90 tablet 1 12/03/2018 at Unknown time  . metaxalone (SKELAXIN) 800 MG tablet metaxalone 800 mg tablet  take 1 tablet by mouth 3 times daily as needed.   12/03/2018 at Unknown time  . metoprolol tartrate (LOPRESSOR) 50 MG tablet TAKE 1 TABLET TWICE DAILY (NEED MD APPOINTMENT) 180 tablet 2 12/03/2018 at 1900  . montelukast (SINGULAIR) 10 MG tablet TAKE 1 TABLET EVERY DAY 90 tablet 1 12/03/2018 at Unknown time  . Multiple Vitamins-Minerals (HM MULTIVITAMIN ADULT GUMMY PO) Take 1 tablet by mouth daily.   12/03/2018 at Unknown time  . nystatin (MYCOSTATIN/NYSTOP) powder Apply to affected areas four times daily as needed. 30 g 2 12/03/2018 at Unknown time  . oxyCODONE-acetaminophen (PERCOCET) 10-325 MG tablet    Past Week at Unknown time  . pregabalin (LYRICA) 75 MG capsule Take 1 capsule (75 mg total) by mouth daily.   12/03/2018 at Unknown time  . RABEprazole (ACIPHEX) 20 MG tablet TAKE 1 TABLET TWICE DAILY 180 tablet 3 12/03/2018 at Unknown time  . RESTASIS 0.05 % ophthalmic emulsion Place 1 drop into both eyes 2 (two) times daily.    12/03/2018 at Unknown time  . risperiDONE (RISPERDAL) 0.5 MG tablet Take 1 tablet (0.5 mg  total) by mouth at bedtime. 90 tablet 2 12/03/2018 at Unknown time  . rosuvastatin (CRESTOR) 5 MG tablet TAKE 1 TABLET AT BEDTIME 90 tablet 1 12/03/2018 at Unknown time  . sertraline (ZOLOFT) 100 MG tablet Take 1 tablet (100 mg total) by mouth daily. 90 tablet 2 12/03/2018 at Unknown time  . Tiotropium Bromide-Olodaterol (STIOLTO  RESPIMAT) 2.5-2.5 MCG/ACT AERS Inhale 2 puffs into the lungs daily. 12 g 3 12/03/2018 at Unknown time  . traZODone (DESYREL) 100 MG tablet Take 1 tablet (100 mg total) by mouth at bedtime. 90 tablet 2 12/03/2018 at Unknown time  . triamcinolone ointment (KENALOG) 0.1 % Apply 1 application topically at bedtime. Apply to elbows at bedtime.   12/03/2018 at Unknown time   Scheduled: . heparin  5,000 Units Subcutaneous Q8H  . insulin aspart  0-5 Units Subcutaneous QHS  . insulin aspart  0-9 Units Subcutaneous TID WC  . metoprolol tartrate  2.5 mg Intravenous Q6H  . sodium chloride flush  3 mL Intravenous Q12H   Continuous: . sodium chloride    . sodium chloride 100 mL/hr at 12/04/18 1921   [id number] chloride, acetaminophen **OR** acetaminophen, albuterol, hydrALAZINE, HYDROmorphone (DILAUDID) injection, LORazepam, ondansetron (ZOFRAN) IV, polyethylene glycol, sodium chloride flush, traZODone  Allergies  Allergen Reactions  . Cephalexin Hives  . Iron Nausea And Vomiting  . Milk-Related Compounds Other (See Comments)    Doesn't agree with stomach.   . Penicillins Hives    Has patient had a PCN reaction causing immediate rash, facial/tongue/throat swelling, SOB or lightheadedness with hypotension: Yes Has patient had a PCN reaction causing severe rash involving mucus membranes or skin necrosis: No Has patient had a PCN reaction that required hospitalization No Has patient had a PCN reaction occurring within the last 10 years: No If all of the above answers are "NO", then may proceed with Cephalosporin use.   [name] Phenazopyridine Hcl Hives           ROS:  A comprehensive review of systems was negative except for: Constitutional: positive for fevers Gastrointestinal: positive for abdominal pain, constipation, nausea and vomiting  Blood pressure (!) 181/62, pulse 96, temperature (!) 101.3 F (38.5 C), temperature source Oral, resp. rate 18, height 5\' 1"  (1.549 m), weight 78.5 kg, SpO2 100  %. Physical Exam Vitals signs reviewed.  Constitutional:      Appearance: She is well-developed.  HENT:     Head: Normocephalic and atraumatic.     Mouth/Throat:     Mouth: Mucous membranes are moist.  Eyes:     Extraocular Movements: Extraocular movements intact.  Cardiovascular:     Rate and Rhythm: Normal rate.  Pulmonary:     Effort: Pulmonary effort is normal.  Abdominal:     General: There is distension.     Palpations: Abdomen is soft.     Tenderness: There is generalized abdominal tenderness. There is no guarding or rebound.     Hernia: No hernia is present.     Comments: RUQ incision healed   Skin:    General: Skin is warm and dry.  Neurological:     General: No focal deficit present.     Mental Status: She is alert and oriented to person, place, and time.  Psychiatric:        Mood and Affect: Mood normal.        Behavior: Behavior normal.     Results: Results for orders placed or performed during the hospital encounter of 12/04/18 (from the past 48 hour(s))  Comprehensive metabolic panel     Status: Abnormal   Collection Time: 12/04/18  6:17 AM  Result Value Ref Range   Sodium 141 135 - 145 mmol/L   Potassium 3.3 (L) 3.5 - 5.1 mmol/L   Chloride 104 98 - 111 mmol/L   CO2 25 22 - 32 mmol/L   Glucose, Bld 172 (H) 70 - 99 mg/dL   BUN 19 8 - 23 mg/dL   Creatinine, Ser 0.85 0.44 - 1.00 mg/dL   Calcium 9.4 8.9 - 10.3 mg/dL   Total Protein 7.8 6.5 - 8.1 g/dL   Albumin 4.3 3.5 - 5.0 g/dL   AST 20 15 - 41 U/L   ALT 23 0 - 44 U/L   Alkaline Phosphatase 97 38 - 126 U/L   Total Bilirubin 0.3 0.3 - 1.2 mg/dL   GFR calc non Af Amer >60 >60 mL/min   GFR calc Af Amer >60 >60 mL/min   Anion gap 12 5 - 15    Comment: Performed at [facility], [address], [place], [place] [address]  CBC with Differential     Status: Abnormal   Collection Time: 12/04/18  6:17 AM  Result Value Ref Range   WBC 10.6 (H) 4.0 - 10.5 K/uL   RBC 5.26 (H) 3.87 - 5.11 MIL/uL   Hemoglobin  11.6 (L) 12.0 - 15.0 g/dL   HCT 41.5 36.0 - 46.0 %   MCV 78.9 (L) 80.0 - 100.0 fL   MCH 22.1 (L) 26.0 - 34.0 pg   MCHC 28.0 (L) 30.0 - 36.0 g/dL   RDW 15.1 11.5 - 15.5 %   Platelets 294 150 - 400 K/uL   nRBC 0.0 0.0 - 0.2 %   Neutrophils Relative % 80 %   Neutro Abs 8.5 (H) 1.7 - 7.7 K/uL   Lymphocytes Relative 11 %   Lymphs Abs 1.2 0.7 - 4.0 K/uL   Monocytes Relative 6 %   Monocytes Absolute 0.6 0.1 - 1.0 K/uL   Eosinophils Relative 1 %   Eosinophils Absolute 0.2 0.0 - 0.5 K/uL   Basophils Relative 1 %   Basophils Absolute 0.1 0.0 - 0.1 K/uL   Immature Granulocytes 1 %   Abs Immature Granulocytes 0.12 (H) 0.00 - 0.07 K/uL    Comment: Performed at [facility], [address], [place], [place] [address]  Urinalysis, Routine w reflex microscopic     Status: Abnormal   Collection Time: 12/04/18  6:17 AM  Result Value Ref Range   Color, Urine STRAW (A) YELLOW   APPearance CLEAR CLEAR   Specific Gravity, Urine 1.030 1.005 - 1.030   pH 6.0 5.0 - 8.0   Glucose, UA 150 (A) NEGATIVE mg/dL   Hgb urine dipstick NEGATIVE NEGATIVE   Bilirubin Urine NEGATIVE NEGATIVE   Ketones, ur NEGATIVE NEGATIVE mg/dL   Protein, ur 30 (A) NEGATIVE mg/dL   Nitrite NEGATIVE NEGATIVE   Leukocytes,Ua NEGATIVE NEGATIVE   RBC / HPF 0-5 0 - 5 RBC/hpf   WBC, UA 0-5 0 - 5 WBC/hpf   Bacteria, UA NONE SEEN NONE SEEN   Squamous Epithelial / LPF 0-5 0 - 5   Mucus PRESENT     Comment: Performed at [facility], [address] [place], [place] [address]  CBG monitoring, ED     Status: Abnormal   Collection Time: 12/04/18 12:34 PM  Result Value Ref Range   Glucose-Capillary 287 (H) 70 - 99 mg/dL  Glucose, capillary     Status: Abnormal  Collection Time: 12/04/18  4:57 PM  Result Value Ref Range   Glucose-Capillary 168 (H) 70 - 99 mg/dL   *Note: Due to a large number of results and/or encounters for the requested time period, some results have not been displayed. A complete set of results can be found in  Results Review.   Personally reviewed CT_ Dilated small bowel and distended stomach, small bowel almost has some fecalization making the dilation seem more chronic, down in pelvis, I see possible transition in the area of the ileum but difficult to follow, very decompressed colon esp on the transverse and descending colon Ct Abdomen Pelvis W Contrast  Result Date: 12/04/2018 CLINICAL DATA:  Abdominal distension and nausea EXAM: CT ABDOMEN AND PELVIS WITH CONTRAST TECHNIQUE: Multidetector CT imaging of the abdomen and pelvis was performed using the standard protocol following bolus administration of intravenous contrast. CONTRAST:  155mL OMNIPAQUE IOHEXOL 300 MG/ML  SOLN COMPARISON:  June 28, 2017 FINDINGS: Lower chest: There is bibasilar atelectatic change, more on the left than on the right. There is no frank airspace consolidation in the bases. There is a small hiatal hernia. Hepatobiliary: No focal liver lesions are evident. The gallbladder is absent. There is no biliary duct dilatation. Pancreas: No pancreatic mass or inflammatory focus. Spleen: No splenic lesions are evident. Adrenals/Urinary Tract: Adrenals bilaterally appear unremarkable. Kidneys bilaterally show no evident mass or hydronephrosis on either side. No evident renal or ureteral calculus on either side. Urinary bladder is midline with wall thickness within normal limits. Stomach/Bowel: The stomach is mildly distended with fluid and air. There is dilatation of much of the small bowel with transition zone in the anterior right abdomen in the proximal ileal region consistent with a degree of small bowel obstruction. Terminal ileum appears normal. There is no free air or portal venous air. No evident diverticulitis. Colon is largely decompressed. Vascular/Lymphatic: There is aortic and iliac artery atherosclerosis. No aneurysm evident. No adenopathy evident in the abdomen or pelvis. Reproductive: Uterus absent.  No evident pelvic mass. Other:  Appendix appears normal. No abscess or ascites is evident in the abdomen or pelvis. Musculoskeletal: There is degenerative change in the lower thoracic and lumbar regions. There are no blastic or lytic bone lesions. There is no intramuscular or abdominal wall lesions. IMPRESSION: 1. Small bowel obstruction with evidence of transition zone and proximal ileal region. No free air. Terminal ileum appears normal. 2.  Small hiatal hernia. 3.  No abscess in the abdomen or pelvis.  Appendix appears normal. 4.  Gallbladder absent.  Uterus absent. 5. No renal or ureteral calculus. No hydronephrosis. Urinary bladder wall thickness within normal limits. Electronically Signed   By: Lowella Grip III M.D.   On: 12/04/2018 09:06   Dg Chest Port 1 View  Result Date: 12/04/2018 CLINICAL DATA:  Status post NG tube placement. EXAM: PORTABLE CHEST 1 VIEW COMPARISON:  None. FINDINGS: This exam is limited by the patient's body habitus and portable technique. NG tube is visualized to the level of the mid to distal esophagus. Lungs are clear. Heart size is normal. IMPRESSION: Limited examination due to the patient's size and portable technique. NG tube is visualized to the mid to distal esophagus. Consider plain film of the abdomen for further assessment. Electronically Signed   By: Inge Rise M.D.   On: 12/04/2018 13:06   Dg Abd 2 Views  Result Date: 12/04/2018 CLINICAL DATA:  Acute abdominal swelling EXAM: ABDOMEN - 2 VIEW COMPARISON:  Abdominal CT 06/28/2017 FINDINGS: Gas distended  stomach and small bowel loops with a paucity of colonic gas. Small bowel loops measure up to 3.5 cm diameter. No convincing pneumoperitoneum when allowing for probable gastric gas at the level of the epigastrium. Cholecystectomy clips. IMPRESSION: Gastric and small bowel distension favoring obstruction. Electronically Signed   By: Monte Fantasia M.D.   On: 12/04/2018 05:54     Assessment & Plan:  [name] is a 68 y.o. female with  SBO and a history of prior SBO. She is doing fair. NG is in place and myself and the RN repositioned it.  A suppository was given with some minor stool output. The patient says her abdomen is tender but overall she is feeling a little better.   -Npo, NG -IVF, correct lytes K 4, Phos 3, Mag 2  -Suppositories ok to stimulate but very little content in the colon or rectum so unlikely to have a large effect -Will monitor and may ultimately need to do a SBFT if no improvement after a few days of NG tube management   All questions were answered to the satisfaction of the patient.   Virl Cagey 12/04/2018, 7:04 PM

## 2018-12-05 ENCOUNTER — Other Ambulatory Visit: Payer: Self-pay

## 2018-12-05 ENCOUNTER — Inpatient Hospital Stay (HOSPITAL_COMMUNITY): Payer: Medicare HMO

## 2018-12-05 LAB — GLUCOSE, CAPILLARY
Glucose-Capillary: 147 mg/dL — ABNORMAL HIGH (ref 70–99)
Glucose-Capillary: 154 mg/dL — ABNORMAL HIGH (ref 70–99)
Glucose-Capillary: 119 mg/dL — ABNORMAL HIGH (ref 70–99)
Glucose-Capillary: 157 mg/dL — ABNORMAL HIGH (ref 70–99)
Glucose-Capillary: 111 mg/dL — ABNORMAL HIGH (ref 70–99)

## 2018-12-05 LAB — BASIC METABOLIC PANEL
Anion gap: 11 (ref 5–15)
BUN: 22 mg/dL (ref 8–23)
CO2: 23 mmol/L (ref 22–32)
Calcium: 8.4 mg/dL — ABNORMAL LOW (ref 8.9–10.3)
Chloride: 105 mmol/L (ref 98–111)
Creatinine, Ser: 0.99 mg/dL (ref 0.44–1.00)
GFR calc Af Amer: >60 mL/min (ref >60)
GFR calc non Af Amer: 59 mL/min — ABNORMAL LOW (ref >60)
Glucose, Bld: 143 mg/dL — ABNORMAL HIGH (ref 70–99)
Potassium: 3.8 mmol/L (ref 3.5–5.1)
Sodium: 139 mmol/L (ref 135–145)

## 2018-12-05 LAB — CBC
HCT: 35.1 % — ABNORMAL LOW (ref 36.0–46.0)
Hemoglobin: 10.1 g/dL — ABNORMAL LOW (ref 12.0–15.0)
MCH: 22.5 pg — ABNORMAL LOW (ref 26.0–34.0)
MCHC: 28.8 g/dL — ABNORMAL LOW (ref 30.0–36.0)
MCV: 78.3 fL — ABNORMAL LOW (ref 80.0–100.0)
Platelets: 275 10*3/uL (ref 150–400)
RBC: 4.48 MIL/uL (ref 3.87–5.11)
RDW: 15 % (ref 11.5–15.5)
WBC: 11.1 10*3/uL — ABNORMAL HIGH (ref 4.0–10.5)
nRBC: 0 % (ref 0.0–0.2)

## 2018-12-05 LAB — HIV ANTIBODY (ROUTINE TESTING W REFLEX): HIV Screen 4th Generation wRfx: NONREACTIVE

## 2018-12-05 MED ORDER — ROSUVASTATIN CALCIUM 10 MG PO TABS
5.0000 mg | ORAL_TABLET | Freq: Every day | ORAL | Status: DC
  Administered 2018-12-05 – 2018-12-07 (×3): 5 mg via ORAL
  Filled 2018-12-05 (×3): qty 1

## 2018-12-05 MED ORDER — PREGABALIN 75 MG PO CAPS
75.0000 mg | ORAL_CAPSULE | Freq: Every day | ORAL | Status: DC
  Administered 2018-12-05 – 2018-12-08 (×4): 75 mg via ORAL
  Filled 2018-12-05 (×4): qty 1

## 2018-12-05 MED ORDER — SERTRALINE HCL 50 MG PO TABS
100.0000 mg | ORAL_TABLET | Freq: Every day | ORAL | Status: DC
  Administered 2018-12-05 – 2018-12-08 (×4): 100 mg via ORAL
  Filled 2018-12-05 (×4): qty 2

## 2018-12-05 MED ORDER — LABETALOL HCL 5 MG/ML IV SOLN
10.0000 mg | INTRAVENOUS | Status: DC | PRN
  Administered 2018-12-05: 09:00:00 10 mg via INTRAVENOUS
  Filled 2018-12-05: qty 4

## 2018-12-05 MED ORDER — LAMOTRIGINE 100 MG PO TABS
100.0000 mg | ORAL_TABLET | Freq: Two times a day (BID) | ORAL | Status: DC
  Administered 2018-12-05 – 2018-12-08 (×7): 100 mg via ORAL
  Filled 2018-12-05 (×6): qty 1

## 2018-12-05 MED ORDER — INSULIN ASPART 100 UNIT/ML ~~LOC~~ SOLN: 0.0000 [IU] | Freq: Every day | SUBCUTANEOUS | Status: DC

## 2018-12-05 MED ORDER — INSULIN ASPART 100 UNIT/ML ~~LOC~~ SOLN
0.0000 [IU] | Freq: Three times a day (TID) | SUBCUTANEOUS | Status: DC
  Administered 2018-12-05: 18:00:00 3 [IU] via SUBCUTANEOUS
  Administered 2018-12-06 – 2018-12-07 (×3): 2 [IU] via SUBCUTANEOUS

## 2018-12-05 MED ORDER — RISPERIDONE 0.5 MG PO TABS
0.5000 mg | ORAL_TABLET | Freq: Every day | ORAL | Status: DC
  Administered 2018-12-05 – 2018-12-07 (×3): 0.5 mg via ORAL
  Filled 2018-12-05 (×3): qty 1

## 2018-12-05 MED ORDER — INSULIN GLARGINE 100 UNIT/ML ~~LOC~~ SOLN
6.0000 [IU] | Freq: Every day | SUBCUTANEOUS | Status: DC
  Administered 2018-12-06 – 2018-12-07 (×2): 6 [IU] via SUBCUTANEOUS
  Filled 2018-12-05 (×4): qty 0.06

## 2018-12-05 MED ORDER — TRAZODONE HCL 50 MG PO TABS
50.0000 mg | ORAL_TABLET | Freq: Every day | ORAL | Status: DC
  Administered 2018-12-05 – 2018-12-07 (×3): 50 mg via ORAL
  Filled 2018-12-05 (×3): qty 1

## 2018-12-05 MED ORDER — METOPROLOL TARTRATE 50 MG PO TABS
50.0000 mg | ORAL_TABLET | Freq: Two times a day (BID) | ORAL | Status: DC
  Administered 2018-12-05 – 2018-12-08 (×6): 50 mg via ORAL
  Filled 2018-12-05 (×6): qty 1

## 2018-12-05 MED ORDER — BISACODYL 10 MG RE SUPP
10.0000 mg | Freq: Every day | RECTAL | Status: DC
  Administered 2018-12-05: 10 mg via RECTAL
  Filled 2018-12-05: qty 1

## 2018-12-05 MED ORDER — LEVOTHYROXINE SODIUM 50 MCG PO TABS
50.0000 ug | ORAL_TABLET | Freq: Every day | ORAL | Status: DC
  Administered 2018-12-06 – 2018-12-08 (×3): 50 ug via ORAL
  Filled 2018-12-05 (×3): qty 1

## 2018-12-05 NOTE — Progress Notes (Signed)
Patient got up to use BSC and sneezed and NG tube came out.   NG tube replaced and x-ray taken to verify placement.  Awaiting results.  Will continue to monitor patient.

## 2018-12-05 NOTE — Progress Notes (Signed)
Called to patient's room about 1030 due to NG tube being pulled out. Patient states "I sneezed and it came right out." Notified MD. Stated okay to leave out pending abdominal x-ray today to evaluate further need. Dr. Constance Haw also aware on rounds. Orders later placed to start clear liquid diet. Patient aware. Donavan Foil, RN

## 2018-12-05 NOTE — Patient Outreach (Signed)
[place] [facility] [facility]) Care Management  12/05/2018  [name] 07/10/1950 [id number]    RN Health Coach notified that the patient was admitted to the hospital on 12/04/2018 for Generalized abdominal pain.  Hospital Liaison will be notified.  Plan:  RN Health Coach will close the program due to admission and notify the primary care.  [name] RN, BSN, [place] Direct Dial:  [phone]  Fax: [phone]

## 2018-12-05 NOTE — Progress Notes (Signed)
NG tube placement verified by X-ray per night shift RN report. Placement okay to use verified with Dr. Thornton Papas this am. Low intermittent suction, 300 ml output since replaced per report. BP 171/50 this am, labetalol given as ordered PRN SBP >160. Discussed with Dr. Denton Brick at bedside this am. Donavan Foil, RN

## 2018-12-05 NOTE — Progress Notes (Signed)
Patient Demographics:    [name], is a 68 y.o. female, DOB - [birth year], [id number]  Admit date - 12/04/2018   Admitting Physician Edell Mesenbrink Denton Brick, MD  Outpatient Primary MD for the patient is [name], MD  LOS - 1  Chief Complaint  Patient presents with   Abdominal Pain        Subjective:    [name] today has no fevers, no emesis,  No chest pain,  --- passing flatus, had BM yesterday after suppository, accidentally pulled out NG tube again,  -Patient is reluctant to have NG tube placed back again  Assessment  & Plan :    Active Problems:   SBO (small bowel obstruction) (HCC)   1)SBO--- recurrent with history of same , CT abd/Pelvis with Small bowel obstruction with evidence of transition zone and proximal ileal region.  - passing flatus, had BM yesterday after suppository, accidentally pulled out NG tube again,  -Patient is reluctant to have NG tube placed back again -Repeat abdominal x-rays on 12/05/2018 shows resolving SBO, general surgeon states okay to try clear liquid diet for now, --Antiemetics and pain management as ordered -Continue IV fluids until oral intake is reliable  2)Febrile Illness--- no further fevers,?? related to #1 above, chest imaging study without pneumonia, UA not suggestive of UTI, blood cultures  NGTD, hold off on antibiotics  3)HTN-okay to restart metoprolol, hold losartan and amlodipine for now,   -may use IV labetalol when necessary  Every 4 hours for systolic blood pressure over 160 mmhg  4)DM2--- PTA patient was on Lantus insulin 20 units daily, restart Lantus 36 units nightly along with sliding scale insulin while restarting liquid diet  5) chronic pain/Depression--stable, continue lorazepam, risperidone, Zoloft, trazodone, Lamictal, Lyrica and Skelaxin.-Hold Oxycodone, may use IV pain medications  6) hypothyroidism---  restart  levothyroxine 50 mcg daily ,  7) history of seizures--no recent seizures, continue Lamictal, PRN benzos   Disposition/Need for in-Hospital Stay- patient unable to be discharged at this time due to SBO initially requiring NG tube and IV fluids until oral intake is more reliable  Code Status : full  Family Communication:   NA (patient is alert, awake and coherent)   Disposition Plan  : home when able to tolerate oral intake without GI symptoms worsening  Consults  :  gen surgery  DVT Prophylaxis  :  - Heparin - SCDs   Lab Results  Component Value Date   PLT 275 12/05/2018    Inpatient Medications  Scheduled Meds:  bisacodyl  10 mg Rectal QHS   heparin  5,000 Units Subcutaneous Q8H   insulin aspart  0-15 Units Subcutaneous TID WC   insulin aspart  0-5 Units Subcutaneous QHS   insulin glargine  6 Units Subcutaneous QHS   lamoTRIgine  100 mg Oral BID   [START ON 12/06/2018] levothyroxine  50 mcg Oral Q0600   metoprolol tartrate  50 mg Oral BID   pregabalin  75 mg Oral Daily   risperiDONE  0.5 mg Oral QHS   rosuvastatin  5 mg Oral QHS   sertraline  100 mg Oral Daily   sodium chloride flush  3 mL Intravenous Q12H   traZODone  50 mg Oral QHS   Continuous Infusions:  sodium chloride     sodium chloride 100 mL/hr at 12/05/18 1445   PRN Meds:.sodium chloride, acetaminophen **OR** acetaminophen, albuterol, HYDROmorphone (DILAUDID) injection, labetalol, LORazepam, ondansetron (ZOFRAN) IV, phenol, polyethylene glycol, sodium chloride flush    Anti-infectives (From admission, onward)   None        Objective:   Vitals:   12/05/18 0546 12/05/18 0805 12/05/18 1219 12/05/18 1443  BP: (!) 186/85 (!) 171/50 (!) 159/59 (!) 143/67  Pulse: 72 75 79 75  Resp: 16 19 20    Temp: 97.9 F (36.6 C) 98.5 F (36.9 C) 98.7 F (37.1 C) 98.8 F (37.1 C)  TempSrc: Oral Oral Oral Oral  SpO2: 97% 100% 95% 100%  Weight:      Height:        Wt Readings from Last 3  Encounters:  12/04/18 78.5 kg  10/10/18 79.1 kg  09/30/18 80.7 kg     Intake/Output Summary (Last 24 hours) at 12/05/2018 1618 Last data filed at 12/05/2018 1445 Gross per 24 hour  Intake 1942.95 ml  Output 350 ml  Net 1592.95 ml   Physical Exam Gen:- Awake Alert,   HEENT:- [place].AT, No sclera icterus Neck-Supple Neck,No JVD,.  Lungs-  CTAB , fair symmetrical air movement CV- S1, S2 normal, regular  Abd-abdomen is less distended, generalized abdominal tenderness, bowel sounds are improving prior hysterectomy and cholecystectomy scars noted,  Extremity/Skin:- No  edema, pedal pulses present  Psych-affect is appropriate, oriented x3 Neuro-no new focal deficits, no tremors   Data Review:   Micro Results Recent Results (from the past 240 hour(s))  SARS CORONAVIRUS 2 (TAT 6-24 HRS) Nasopharyngeal Nasopharyngeal Swab     Status: None   Collection Time: 12/04/18 10:12 AM   Specimen: Nasopharyngeal Swab  Result Value Ref Range Status   SARS Coronavirus 2 NEGATIVE NEGATIVE Final    Comment: (NOTE) SARS-CoV-2 target nucleic acids are NOT DETECTED. The SARS-CoV-2 RNA is generally detectable in upper and lower respiratory specimens during the acute phase of infection. Negative results do not preclude SARS-CoV-2 infection, do not rule out co-infections with other pathogens, and should not be used as the sole basis for treatment or other patient management decisions. Negative results must be combined with clinical observations, patient history, and epidemiological information. The expected result is Negative. Fact Sheet for Patients: SugarRoll.be Fact Sheet for Healthcare Providers: https://www.woods-mathews.com/ This test is not yet approved or cleared by the [place] FDA and  has been authorized for detection and/or diagnosis of SARS-CoV-2 by FDA under an Emergency Use Authorization (EUA). This EUA will remain  in effect (meaning this  test can be used) for the duration of the COVID-19 declaration under Section 56 4(b)(1) of the Act, 21 U.S.C. section 360bbb-3(b)(1), unless the authorization is terminated or revoked sooner. Performed at [facility], [place] [address] [place], [place] [address]   Culture, blood (Routine X 2) w Reflex to ID Panel     Status: None (Preliminary result)   Collection Time: 12/04/18  6:39 PM   Specimen: BLOOD  Result Value Ref Range Status   Specimen Description BLOOD LEFT ANTECUBITAL  Final   Special Requests   Final    BOTTLES DRAWN AEROBIC AND ANAEROBIC Blood Culture adequate volume   Culture   Final    NO GROWTH < 12 HOURS Performed at [facility], [address] [place], [place] [address]    Report Status PENDING  Incomplete  Culture, blood (Routine X 2) w Reflex to ID Panel  Status: None (Preliminary result)   Collection Time: 12/04/18  6:39 PM   Specimen: BLOOD LEFT HAND  Result Value Ref Range Status   Specimen Description BLOOD LEFT HAND  Final   Special Requests   Final    BOTTLES DRAWN AEROBIC AND ANAEROBIC Blood Culture adequate volume   Culture   Final    NO GROWTH < 12 HOURS Performed at [facility], [address] [place], [place] [address]    Report Status PENDING  Incomplete    Radiology Reports Ct Abdomen Pelvis W Contrast  Result Date: 12/04/2018 CLINICAL DATA:  Abdominal distension and nausea EXAM: CT ABDOMEN AND PELVIS WITH CONTRAST TECHNIQUE: Multidetector CT imaging of the abdomen and pelvis was performed using the standard protocol following bolus administration of intravenous contrast. CONTRAST:  172mL OMNIPAQUE IOHEXOL 300 MG/ML  SOLN COMPARISON:  June 28, 2017 FINDINGS: Lower chest: There is bibasilar atelectatic change, more on the left than on the right. There is no frank airspace consolidation in the bases. There is a small hiatal hernia. Hepatobiliary: No focal liver lesions are evident. The gallbladder is absent. There is no biliary duct  dilatation. Pancreas: No pancreatic mass or inflammatory focus. Spleen: No splenic lesions are evident. Adrenals/Urinary Tract: Adrenals bilaterally appear unremarkable. Kidneys bilaterally show no evident mass or hydronephrosis on either side. No evident renal or ureteral calculus on either side. Urinary bladder is midline with wall thickness within normal limits. Stomach/Bowel: The stomach is mildly distended with fluid and air. There is dilatation of much of the small bowel with transition zone in the anterior right abdomen in the proximal ileal region consistent with a degree of small bowel obstruction. Terminal ileum appears normal. There is no free air or portal venous air. No evident diverticulitis. Colon is largely decompressed. Vascular/Lymphatic: There is aortic and iliac artery atherosclerosis. No aneurysm evident. No adenopathy evident in the abdomen or pelvis. Reproductive: Uterus absent.  No evident pelvic mass. Other: Appendix appears normal. No abscess or ascites is evident in the abdomen or pelvis. Musculoskeletal: There is degenerative change in the lower thoracic and lumbar regions. There are no blastic or lytic bone lesions. There is no intramuscular or abdominal wall lesions. IMPRESSION: 1. Small bowel obstruction with evidence of transition zone and proximal ileal region. No free air. Terminal ileum appears normal. 2.  Small hiatal hernia. 3.  No abscess in the abdomen or pelvis.  Appendix appears normal. 4.  Gallbladder absent.  Uterus absent. 5. No renal or ureteral calculus. No hydronephrosis. Urinary bladder wall thickness within normal limits. Electronically Signed   By: Lowella Grip III M.D.   On: 12/04/2018 09:06   Dg Chest Port 1 View  Result Date: 12/05/2018 CLINICAL DATA:  68 year old female with NG tube placement. EXAM: PORTABLE CHEST 1 VIEW COMPARISON:  Chest radiograph dated 12/04/2018 FINDINGS: The enteric tube extends below the diaphragm with tip beyond the inferior  margin of the image. No focal consolidation, pleural effusion, pneumothorax. The cardiac silhouette is within normal limits. No acute osseous pathology. IMPRESSION: Enteric tube with tip beyond the inferior margin of the image. Electronically Signed   By: Anner Crete M.D.   On: 12/05/2018 03:48   Dg Chest Port 1 View  Result Date: 12/04/2018 CLINICAL DATA:  Status post NG tube placement. EXAM: PORTABLE CHEST 1 VIEW COMPARISON:  None. FINDINGS: This exam is limited by the patient's body habitus and portable technique. NG tube is visualized to the level of the mid to distal esophagus. Lungs are clear.  Heart size is normal. IMPRESSION: Limited examination due to the patient's size and portable technique. NG tube is visualized to the mid to distal esophagus. Consider plain film of the abdomen for further assessment. Electronically Signed   By: Inge Rise M.D.   On: 12/04/2018 13:06   Dg Chest Port 1v Same Day  Result Date: 12/04/2018 CLINICAL DATA:  Nasogastric tube placement. EXAM: PORTABLE CHEST 1 VIEW COMPARISON:  12/04/2018 FINDINGS: Nasogastric tube extends into the abdomen. The tip of the tube is beyond the image but the side hole appears to be in the gastric body region. Few densities at the right lung base are suggestive for atelectasis. Otherwise, the lungs are clear. Heart size is within normal limits. Negative for a pneumothorax. IMPRESSION: Nasogastric tube extends into the stomach. Electronically Signed   By: Markus Daft M.D.   On: 12/04/2018 21:13   Dg Abd 2 Views  Result Date: 12/05/2018 CLINICAL DATA:  Abdominal pain and distension. EXAM: ABDOMEN - 2 VIEW COMPARISON:  CT scan 12/04/2018 FINDINGS: The lung bases are grossly clear. Improved bowel gas pattern. There are persistent air-filled small bowel loops in the mid abdomen with scattered air-fluid levels but the distension is not as significant and there is now much more air throughout the colon. No free air. The soft tissue  shadows are maintained. IMPRESSION: Improved bowel gas pattern suggesting resolving small-bowel obstruction. Electronically Signed   By: Marijo Sanes M.D.   On: 12/05/2018 11:59   Dg Abd 2 Views  Result Date: 12/04/2018 CLINICAL DATA:  Acute abdominal swelling EXAM: ABDOMEN - 2 VIEW COMPARISON:  Abdominal CT 06/28/2017 FINDINGS: Gas distended stomach and small bowel loops with a paucity of colonic gas. Small bowel loops measure up to 3.5 cm diameter. No convincing pneumoperitoneum when allowing for probable gastric gas at the level of the epigastrium. Cholecystectomy clips. IMPRESSION: Gastric and small bowel distension favoring obstruction. Electronically Signed   By: Monte Fantasia M.D.   On: 12/04/2018 05:54   Mm 3d Screen Breast Bilateral  Result Date: 11/25/2018 CLINICAL DATA:  Screening. EXAM: DIGITAL SCREENING BILATERAL MAMMOGRAM WITH TOMO AND CAD COMPARISON:  Previous exam(s). ACR Breast Density Category a: The breast tissue is almost entirely fatty. FINDINGS: There are no findings suspicious for malignancy. Images were processed with CAD. IMPRESSION: No mammographic evidence of malignancy. A result letter of this screening mammogram will be mailed directly to the patient. RECOMMENDATION: Screening mammogram in one year. (Code:SM-B-01Y) BI-RADS CATEGORY  1: Negative. Electronically Signed   By: Franki Cabot M.D.   On: 11/25/2018 16:40     CBC Recent Labs  Lab 12/04/18 0617 12/05/18 0508  WBC 10.6* 11.1*  HGB 11.6* 10.1*  HCT 41.5 35.1*  PLT 294 275  MCV 78.9* 78.3*  MCH 22.1* 22.5*  MCHC 28.0* 28.8*  RDW 15.1 15.0  LYMPHSABS 1.2  --   MONOABS 0.6  --   EOSABS 0.2  --   BASOSABS 0.1  --     Chemistries  Recent Labs  Lab 12/04/18 0617 12/05/18 0508  NA 141 139  K 3.3* 3.8  CL 104 105  CO2 25 23  GLUCOSE 172* 143*  BUN 19 22  CREATININE 0.85 0.99  CALCIUM 9.4 8.4*  AST 20  --   ALT 23  --   ALKPHOS 97  --   BILITOT 0.3  --     ------------------------------------------------------------------------------------------------------------------ No results for input(s): CHOL, HDL, LDLCALC, TRIG, CHOLHDL, LDLDIRECT in the last 72 hours.  Lab Results  Component Value Date   HGBA1C 5.4 09/30/2018   ------------------------------------------------------------------------------------------------------------------ No results for input(s): TSH, T4TOTAL, T3FREE, THYROIDAB in the last 72 hours.  Invalid input(s): FREET3 ------------------------------------------------------------------------------------------------------------------ No results for input(s): VITAMINB12, FOLATE, FERRITIN, TIBC, IRON, RETICCTPCT in the last 72 hours.  Coagulation profile No results for input(s): INR, PROTIME in the last 168 hours.  No results for input(s): DDIMER in the last 72 hours.  Cardiac Enzymes No results for input(s): CKMB, TROPONINI, MYOGLOBIN in the last 168 hours.  Invalid input(s): CK ------------------------------------------------------------------------------------------------------------------    Component Value Date/Time   BNP 55.0 05/19/2017 1023   Thijs Brunton M.D on 12/05/2018 at 4:18 PM  Go to [email or web address] - for contact info  [facility] - Office  [phone]

## 2018-12-05 NOTE — Progress Notes (Signed)
[facility] Progress Note     Subjective: Having flatus yesterday, small BM with suppository. Ng out last night and this AM. Xray done and shows resolving SBO.    Objective: Vital signs in last 24 hours: Temp:  [97.9 F (36.6 C)-102 F (38.9 C)] 98.7 F (37.1 C) (09/24 1219) Pulse Rate:  [72-105] 79 (09/24 1219) Resp:  [16-20] 20 (09/24 1219) BP: (90-190)/(50-137) 159/59 (09/24 1219) SpO2:  [95 %-100 %] 95 % (09/24 1219) Last BM Date: 12/04/18  Intake/Output from previous day: 09/23 0701 - 09/24 0700 In: 765 [I.V.:765] Out: 350 [Urine:50; Emesis/NG output:300] Intake/Output this shift: Total I/O In: 3 [I.V.:3] Out: -   General appearance: alert, cooperative and no distress Resp: normal work of breathing GI: soft, mildly distended, somewhat tender, no rebound or guarding  Lab Results:  Recent Labs    12/04/18 0617 12/05/18 0508  WBC 10.6* 11.1*  HGB 11.6* 10.1*  HCT 41.5 35.1*  PLT 294 275   BMET Recent Labs    12/04/18 0617 12/05/18 0508  NA 141 139  K 3.3* 3.8  CL 104 105  CO2 25 23  GLUCOSE 172* 143*  BUN 19 22  CREATININE 0.85 0.99  CALCIUM 9.4 8.4*   PT/INR No results for input(s): LABPROT, INR in the last 72 hours.  Studies/Results: Ct Abdomen Pelvis W Contrast  Result Date: 12/04/2018 CLINICAL DATA:  Abdominal distension and nausea EXAM: CT ABDOMEN AND PELVIS WITH CONTRAST TECHNIQUE: Multidetector CT imaging of the abdomen and pelvis was performed using the standard protocol following bolus administration of intravenous contrast. CONTRAST:  111mL OMNIPAQUE IOHEXOL 300 MG/ML  SOLN COMPARISON:  June 28, 2017 FINDINGS: Lower chest: There is bibasilar atelectatic change, more on the left than on the right. There is no frank airspace consolidation in the bases. There is a small hiatal hernia. Hepatobiliary: No focal liver lesions are evident. The gallbladder is absent. There is no biliary duct dilatation. Pancreas: No pancreatic mass  or inflammatory focus. Spleen: No splenic lesions are evident. Adrenals/Urinary Tract: Adrenals bilaterally appear unremarkable. Kidneys bilaterally show no evident mass or hydronephrosis on either side. No evident renal or ureteral calculus on either side. Urinary bladder is midline with wall thickness within normal limits. Stomach/Bowel: The stomach is mildly distended with fluid and air. There is dilatation of much of the small bowel with transition zone in the anterior right abdomen in the proximal ileal region consistent with a degree of small bowel obstruction. Terminal ileum appears normal. There is no free air or portal venous air. No evident diverticulitis. Colon is largely decompressed. Vascular/Lymphatic: There is aortic and iliac artery atherosclerosis. No aneurysm evident. No adenopathy evident in the abdomen or pelvis. Reproductive: Uterus absent.  No evident pelvic mass. Other: Appendix appears normal. No abscess or ascites is evident in the abdomen or pelvis. Musculoskeletal: There is degenerative change in the lower thoracic and lumbar regions. There are no blastic or lytic bone lesions. There is no intramuscular or abdominal wall lesions. IMPRESSION: 1. Small bowel obstruction with evidence of transition zone and proximal ileal region. No free air. Terminal ileum appears normal. 2.  Small hiatal hernia. 3.  No abscess in the abdomen or pelvis.  Appendix appears normal. 4.  Gallbladder absent.  Uterus absent. 5. No renal or ureteral calculus. No hydronephrosis. Urinary bladder wall thickness within normal limits. Electronically Signed   By: Lowella Grip III M.D.   On: 12/04/2018 09:06   Dg Chest Port 1 View  Result Date: 12/05/2018  CLINICAL DATA:  68 year old female with NG tube placement. EXAM: PORTABLE CHEST 1 VIEW COMPARISON:  Chest radiograph dated 12/04/2018 FINDINGS: The enteric tube extends below the diaphragm with tip beyond the inferior margin of the image. No focal consolidation,  pleural effusion, pneumothorax. The cardiac silhouette is within normal limits. No acute osseous pathology. IMPRESSION: Enteric tube with tip beyond the inferior margin of the image. Electronically Signed   By: Anner Crete M.D.   On: 12/05/2018 03:48   Dg Chest Port 1 View  Result Date: 12/04/2018 CLINICAL DATA:  Status post NG tube placement. EXAM: PORTABLE CHEST 1 VIEW COMPARISON:  None. FINDINGS: This exam is limited by the patient's body habitus and portable technique. NG tube is visualized to the level of the mid to distal esophagus. Lungs are clear. Heart size is normal. IMPRESSION: Limited examination due to the patient's size and portable technique. NG tube is visualized to the mid to distal esophagus. Consider plain film of the abdomen for further assessment. Electronically Signed   By: Inge Rise M.D.   On: 12/04/2018 13:06   Dg Chest Port 1v Same Day  Result Date: 12/04/2018 CLINICAL DATA:  Nasogastric tube placement. EXAM: PORTABLE CHEST 1 VIEW COMPARISON:  12/04/2018 FINDINGS: Nasogastric tube extends into the abdomen. The tip of the tube is beyond the image but the side hole appears to be in the gastric body region. Few densities at the right lung base are suggestive for atelectasis. Otherwise, the lungs are clear. Heart size is within normal limits. Negative for a pneumothorax. IMPRESSION: Nasogastric tube extends into the stomach. Electronically Signed   By: Markus Daft M.D.   On: 12/04/2018 21:13   Dg Abd 2 Views  Result Date: 12/05/2018 CLINICAL DATA:  Abdominal pain and distension. EXAM: ABDOMEN - 2 VIEW COMPARISON:  CT scan 12/04/2018 FINDINGS: The lung bases are grossly clear. Improved bowel gas pattern. There are persistent air-filled small bowel loops in the mid abdomen with scattered air-fluid levels but the distension is not as significant and there is now much more air throughout the colon. No free air. The soft tissue shadows are maintained. IMPRESSION: Improved bowel  gas pattern suggesting resolving small-bowel obstruction. Electronically Signed   By: Marijo Sanes M.D.   On: 12/05/2018 11:59   Dg Abd 2 Views  Result Date: 12/04/2018 CLINICAL DATA:  Acute abdominal swelling EXAM: ABDOMEN - 2 VIEW COMPARISON:  Abdominal CT 06/28/2017 FINDINGS: Gas distended stomach and small bowel loops with a paucity of colonic gas. Small bowel loops measure up to 3.5 cm diameter. No convincing pneumoperitoneum when allowing for probable gastric gas at the level of the epigastrium. Cholecystectomy clips. IMPRESSION: Gastric and small bowel distension favoring obstruction. Electronically Signed   By: Monte Fantasia M.D.   On: 12/04/2018 05:54    Assessment/Plan: [name] is a 68 yo with a resolving SBO. Doing fair overall. Xray with improved pattern. -Clear liquids ok -Advance as tolerated   LOS: 1 day    Virl Cagey 12/05/2018

## 2018-12-06 ENCOUNTER — Inpatient Hospital Stay (HOSPITAL_COMMUNITY): Payer: Medicare HMO

## 2018-12-06 LAB — GLUCOSE, CAPILLARY
Glucose-Capillary: 105 mg/dL — ABNORMAL HIGH (ref 70–99)
Glucose-Capillary: 112 mg/dL — ABNORMAL HIGH (ref 70–99)
Glucose-Capillary: 128 mg/dL — ABNORMAL HIGH (ref 70–99)
Glucose-Capillary: 150 mg/dL — ABNORMAL HIGH (ref 70–99)
Glucose-Capillary: 84 mg/dL (ref 70–99)
Glucose-Capillary: 94 mg/dL (ref 70–99)

## 2018-12-06 MED ORDER — BISACODYL 10 MG RE SUPP
10.0000 mg | Freq: Two times a day (BID) | RECTAL | Status: DC
  Administered 2018-12-06 – 2018-12-08 (×5): 10 mg via RECTAL
  Filled 2018-12-06 (×5): qty 1

## 2018-12-06 NOTE — Progress Notes (Signed)
Angio cath out, DC

## 2018-12-06 NOTE — Progress Notes (Signed)
Patient Demographics:    [name], is a 68 y.o. female, DOB - 1950-05-10, [id number]  Admit date - 12/04/2018   Admitting Physician [name] Toral Denton Brick, MD  Outpatient Primary MD for the patient is [name] Helper, MD  LOS - 2  Chief Complaint  Patient presents with  . Abdominal Pain        Subjective:    [name] today has no fevers, no emesis,  No chest pain,  --- -increased abdominal pain and nausea with liquid diet intake, -No BM, no significant flatus  Assessment  & Plan :    Principal Problem:   SBO (small bowel obstruction) (HCC) Active Problems:   Essential hypertension   Bipolar disorder (HCC)   Seizure disorder (HCC)   Hypothyroidism   COPD mixed type (HCC)   Tobacco user   Chronic pain syndrome   1)SBO--- recurrent with history of same , CT abd/Pelvis with Small bowel obstruction with evidence of transition zone and proximal ileal region.  - -Abdominal discomfort and nausea with liquid diet intake, -Patient is reluctant to have NG tube placed back again -Repeat abdominal x-rays on 12/06/2018 shows resolving SBO, general surgeon states okay to try clear liquid diet for now -May need SBFT if symptoms do not resolve --Antiemetics and pain management as ordered -Continue IV fluids until oral intake is reliable  2)Febrile Illness--- no further fevers,?? related to #1 above, chest imaging study without pneumonia, UA not suggestive of UTI, blood cultures  NGTD, hold off on antibiotics  3)HTN-continue metoprolol, hold losartan and restart amlodipine  -may use IV labetalol when necessary  Every 4 hours for systolic blood pressure over 160 mmhg  4)DM2--- PTA patient was on Lantus insulin 20 units daily, restart Lantus 36 units nightly along with sliding scale insulin while restarting liquid diet  5) chronic pain/Depression--stable, continue lorazepam, risperidone, Zoloft,  trazodone, Lamictal, Lyrica and Skelaxin.-Hold Oxycodone, may use IV pain medications  6) hypothyroidism---  continue levothyroxine 50 mcg daily ,  7) history of seizures--no recent seizures, continue Lamictal, PRN benzos   Disposition/Need for in-Hospital Stay- patient unable to be discharged at this time due to SBO initially requiring NG tube and IV fluids until oral intake is more reliable  Code Status : full  Family Communication:   NA (patient is alert, awake and coherent)   Disposition Plan  : home when able to tolerate oral intake without GI symptoms worsening  Consults  :  gen surgery  DVT Prophylaxis  :  - Heparin - SCDs   Lab Results  Component Value Date   PLT 275 12/05/2018    Inpatient Medications  Scheduled Meds: . bisacodyl  10 mg Rectal BID  . heparin  5,000 Units Subcutaneous Q8H  . insulin aspart  0-15 Units Subcutaneous TID WC  . insulin aspart  0-5 Units Subcutaneous QHS  . insulin glargine  6 Units Subcutaneous QHS  . lamoTRIgine  100 mg Oral BID  . levothyroxine  50 mcg Oral Q0600  . metoprolol tartrate  50 mg Oral BID  . pregabalin  75 mg Oral Daily  . risperiDONE  0.5 mg Oral QHS  . rosuvastatin  5 mg Oral QHS  . sertraline  100 mg Oral Daily  . sodium chloride  flush  3 mL Intravenous Q12H  . traZODone  50 mg Oral QHS   Continuous Infusions: . sodium chloride    . sodium chloride 100 mL/hr at 12/06/18 1410   PRN Meds:.sodium chloride, acetaminophen **OR** acetaminophen, albuterol, HYDROmorphone (DILAUDID) injection, labetalol, LORazepam, ondansetron (ZOFRAN) IV, phenol, polyethylene glycol, sodium chloride flush    Anti-infectives (From admission, onward)   None        Objective:   Vitals:   12/05/18 2137 12/06/18 0517 12/06/18 0800 12/06/18 1308  BP: (!) 179/57 (!) 164/61 (!) 157/77 (!) 143/60  Pulse: 71 (!) 54 63 (!) 55  Resp: 20 20 18 18   Temp: 99.1 F (37.3 C) 98.2 F (36.8 C) 98.2 F (36.8 C) 98.1 F (36.7 C)  TempSrc:  Oral Oral Oral   SpO2: 100% 100% 100% 100%  Weight:      Height:        Wt Readings from Last 3 Encounters:  12/04/18 78.5 kg  10/10/18 79.1 kg  09/30/18 80.7 kg     Intake/Output Summary (Last 24 hours) at 12/06/2018 1746 Last data filed at 12/06/2018 1355 Gross per 24 hour  Intake 276 ml  Output 550 ml  Net -274 ml   Physical Exam Gen:- Awake Alert,   HEENT:- [place].AT, No sclera icterus Neck-Supple Neck,No JVD,.  Lungs-  CTAB , fair symmetrical air movement CV- S1, S2 normal, regular  Abd-abdomen is less distended, generalized abdominal tenderness, bowel sounds are improving prior hysterectomy and cholecystectomy scars noted,  Extremity/Skin:- No  edema, pedal pulses present  Psych-affect is appropriate, oriented x3 Neuro-no new focal deficits, no tremors   Data Review:   Micro Results Recent Results (from the past 240 hour(s))  SARS CORONAVIRUS 2 (TAT 6-24 HRS) Nasopharyngeal Nasopharyngeal Swab     Status: None   Collection Time: 12/04/18 10:12 AM   Specimen: Nasopharyngeal Swab  Result Value Ref Range Status   SARS Coronavirus 2 NEGATIVE NEGATIVE Final    Comment: (NOTE) SARS-CoV-2 target nucleic acids are NOT DETECTED. The SARS-CoV-2 RNA is generally detectable in upper and lower respiratory specimens during the acute phase of infection. Negative results do not preclude SARS-CoV-2 infection, do not rule out co-infections with other pathogens, and should not be used as the sole basis for treatment or other patient management decisions. Negative results must be combined with clinical observations, patient history, and epidemiological information. The expected result is Negative. Fact Sheet for Patients: SugarRoll.be Fact Sheet for Healthcare Providers: https://www.woods-mathews.com/ This test is not yet approved or cleared by the [place] FDA and  has been authorized for detection and/or diagnosis of SARS-CoV-2 by FDA  under an Emergency Use Authorization (EUA). This EUA will remain  in effect (meaning this test can be used) for the duration of the COVID-19 declaration under Section 56 4(b)(1) of the Act, 21 U.S.C. section 360bbb-3(b)(1), unless the authorization is terminated or revoked sooner. Performed at [facility], [place] [address], [place], [place] [address]   Culture, blood (Routine X 2) w Reflex to ID Panel     Status: None (Preliminary result)   Collection Time: 12/04/18  6:39 PM   Specimen: BLOOD  Result Value Ref Range Status   Specimen Description BLOOD LEFT ANTECUBITAL  Final   Special Requests   Final    BOTTLES DRAWN AEROBIC AND ANAEROBIC Blood Culture adequate volume   Culture   Final    NO GROWTH 2 DAYS Performed at [facility], [address], [place], [place] [address]  Report Status PENDING  Incomplete  Culture, blood (Routine X 2) w Reflex to ID Panel     Status: None (Preliminary result)   Collection Time: 12/04/18  6:39 PM   Specimen: BLOOD LEFT HAND  Result Value Ref Range Status   Specimen Description BLOOD LEFT HAND  Final   Special Requests   Final    BOTTLES DRAWN AEROBIC AND ANAEROBIC Blood Culture adequate volume   Culture   Final    NO GROWTH 2 DAYS Performed at [facility], [address] [place], [place] [address]    Report Status PENDING  Incomplete    Radiology Reports Ct Abdomen Pelvis W Contrast  Result Date: 12/04/2018 CLINICAL DATA:  Abdominal distension and nausea EXAM: CT ABDOMEN AND PELVIS WITH CONTRAST TECHNIQUE: Multidetector CT imaging of the abdomen and pelvis was performed using the standard protocol following bolus administration of intravenous contrast. CONTRAST:  190mL OMNIPAQUE IOHEXOL 300 MG/ML  SOLN COMPARISON:  June 28, 2017 FINDINGS: Lower chest: There is bibasilar atelectatic change, more on the left than on the right. There is no frank airspace consolidation in the bases. There is a small hiatal hernia. Hepatobiliary: No focal  liver lesions are evident. The gallbladder is absent. There is no biliary duct dilatation. Pancreas: No pancreatic mass or inflammatory focus. Spleen: No splenic lesions are evident. Adrenals/Urinary Tract: Adrenals bilaterally appear unremarkable. Kidneys bilaterally show no evident mass or hydronephrosis on either side. No evident renal or ureteral calculus on either side. Urinary bladder is midline with wall thickness within normal limits. Stomach/Bowel: The stomach is mildly distended with fluid and air. There is dilatation of much of the small bowel with transition zone in the anterior right abdomen in the proximal ileal region consistent with a degree of small bowel obstruction. Terminal ileum appears normal. There is no free air or portal venous air. No evident diverticulitis. Colon is largely decompressed. Vascular/Lymphatic: There is aortic and iliac artery atherosclerosis. No aneurysm evident. No adenopathy evident in the abdomen or pelvis. Reproductive: Uterus absent.  No evident pelvic mass. Other: Appendix appears normal. No abscess or ascites is evident in the abdomen or pelvis. Musculoskeletal: There is degenerative change in the lower thoracic and lumbar regions. There are no blastic or lytic bone lesions. There is no intramuscular or abdominal wall lesions. IMPRESSION: 1. Small bowel obstruction with evidence of transition zone and proximal ileal region. No free air. Terminal ileum appears normal. 2.  Small hiatal hernia. 3.  No abscess in the abdomen or pelvis.  Appendix appears normal. 4.  Gallbladder absent.  Uterus absent. 5. No renal or ureteral calculus. No hydronephrosis. Urinary bladder wall thickness within normal limits. Electronically Signed   By: Lowella Grip III M.D.   On: 12/04/2018 09:06   Dg Chest Port 1 View  Result Date: 12/05/2018 CLINICAL DATA:  68 year old female with NG tube placement. EXAM: PORTABLE CHEST 1 VIEW COMPARISON:  Chest radiograph dated 12/04/2018 FINDINGS:  The enteric tube extends below the diaphragm with tip beyond the inferior margin of the image. No focal consolidation, pleural effusion, pneumothorax. The cardiac silhouette is within normal limits. No acute osseous pathology. IMPRESSION: Enteric tube with tip beyond the inferior margin of the image. Electronically Signed   By: Anner Crete M.D.   On: 12/05/2018 03:48   Dg Chest Port 1 View  Result Date: 12/04/2018 CLINICAL DATA:  Status post NG tube placement. EXAM: PORTABLE CHEST 1 VIEW COMPARISON:  None. FINDINGS: This exam is limited by the patient's body habitus  and portable technique. NG tube is visualized to the level of the mid to distal esophagus. Lungs are clear. Heart size is normal. IMPRESSION: Limited examination due to the patient's size and portable technique. NG tube is visualized to the mid to distal esophagus. Consider plain film of the abdomen for further assessment. Electronically Signed   By: Inge Rise M.D.   On: 12/04/2018 13:06   Dg Chest Port 1v Same Day  Result Date: 12/04/2018 CLINICAL DATA:  Nasogastric tube placement. EXAM: PORTABLE CHEST 1 VIEW COMPARISON:  12/04/2018 FINDINGS: Nasogastric tube extends into the abdomen. The tip of the tube is beyond the image but the side hole appears to be in the gastric body region. Few densities at the right lung base are suggestive for atelectasis. Otherwise, the lungs are clear. Heart size is within normal limits. Negative for a pneumothorax. IMPRESSION: Nasogastric tube extends into the stomach. Electronically Signed   By: Markus Daft M.D.   On: 12/04/2018 21:13   Dg Abd 2 Views  Result Date: 12/06/2018 CLINICAL DATA:  ABD pain and distention x several days. Hx cholecystectomy, hysterectomy EXAM: ABDOMEN - 2 VIEW COMPARISON:  Abdominal x-ray 12/05/2018 FINDINGS: Persistent mildly dilated loops of small bowel in the mid abdomen with gas distally similar to the prior study. No evidence of free air. No acute finding in the  visualized skeleton. IMPRESSION: Persistent mildly dilated loops of small bowel with gas distally, similar to the prior study, may represent resolving small-bowel obstruction. Electronically Signed   By: Audie Pinto M.D.   On: 12/06/2018 14:54   Dg Abd 2 Views  Result Date: 12/05/2018 CLINICAL DATA:  Abdominal pain and distension. EXAM: ABDOMEN - 2 VIEW COMPARISON:  CT scan 12/04/2018 FINDINGS: The lung bases are grossly clear. Improved bowel gas pattern. There are persistent air-filled small bowel loops in the mid abdomen with scattered air-fluid levels but the distension is not as significant and there is now much more air throughout the colon. No free air. The soft tissue shadows are maintained. IMPRESSION: Improved bowel gas pattern suggesting resolving small-bowel obstruction. Electronically Signed   By: Marijo Sanes M.D.   On: 12/05/2018 11:59   Dg Abd 2 Views  Result Date: 12/04/2018 CLINICAL DATA:  Acute abdominal swelling EXAM: ABDOMEN - 2 VIEW COMPARISON:  Abdominal CT 06/28/2017 FINDINGS: Gas distended stomach and small bowel loops with a paucity of colonic gas. Small bowel loops measure up to 3.5 cm diameter. No convincing pneumoperitoneum when allowing for probable gastric gas at the level of the epigastrium. Cholecystectomy clips. IMPRESSION: Gastric and small bowel distension favoring obstruction. Electronically Signed   By: Monte Fantasia M.D.   On: 12/04/2018 05:54   Mm 3d Screen Breast Bilateral  Result Date: 11/25/2018 CLINICAL DATA:  Screening. EXAM: DIGITAL SCREENING BILATERAL MAMMOGRAM WITH TOMO AND CAD COMPARISON:  Previous exam(s). ACR Breast Density Category a: The breast tissue is almost entirely fatty. FINDINGS: There are no findings suspicious for malignancy. Images were processed with CAD. IMPRESSION: No mammographic evidence of malignancy. A result letter of this screening mammogram will be mailed directly to the patient. RECOMMENDATION: Screening mammogram in one  year. (Code:SM-B-01Y) BI-RADS CATEGORY  1: Negative. Electronically Signed   By: Franki Cabot M.D.   On: 11/25/2018 16:40     CBC Recent Labs  Lab 12/04/18 0617 12/05/18 0508  WBC 10.6* 11.1*  HGB 11.6* 10.1*  HCT 41.5 35.1*  PLT 294 275  MCV 78.9* 78.3*  MCH 22.1* 22.5*  MCHC 28.0* 28.8*  RDW 15.1 15.0  LYMPHSABS 1.2  --   MONOABS 0.6  --   EOSABS 0.2  --   BASOSABS 0.1  --     Chemistries  Recent Labs  Lab 12/04/18 0617 12/05/18 0508  NA 141 139  K 3.3* 3.8  CL 104 105  CO2 25 23  GLUCOSE 172* 143*  BUN 19 22  CREATININE 0.85 0.99  CALCIUM 9.4 8.4*  AST 20  --   ALT 23  --   ALKPHOS 97  --   BILITOT 0.3  --    ------------------------------------------------------------------------------------------------------------------ No results for input(s): CHOL, HDL, LDLCALC, TRIG, CHOLHDL, LDLDIRECT in the last 72 hours.  Lab Results  Component Value Date   HGBA1C 5.4 09/30/2018   ------------------------------------------------------------------------------------------------------------------ No results for input(s): TSH, T4TOTAL, T3FREE, THYROIDAB in the last 72 hours.  Invalid input(s): FREET3 ------------------------------------------------------------------------------------------------------------------ No results for input(s): VITAMINB12, FOLATE, FERRITIN, TIBC, IRON, RETICCTPCT in the last 72 hours.  Coagulation profile No results for input(s): INR, PROTIME in the last 168 hours.  No results for input(s): DDIMER in the last 72 hours.  Cardiac Enzymes No results for input(s): CKMB, TROPONINI, MYOGLOBIN in the last 168 hours.  Invalid input(s): CK ------------------------------------------------------------------------------------------------------------------    Component Value Date/Time   BNP 55.0 05/19/2017 1023   Davonne Jarnigan M.D on 12/06/2018 at 5:46 PM  Go to [email or web address] - for contact info  [facility] - Office  [phone]

## 2018-12-06 NOTE — Care Management Important Message (Signed)
Important Message  Patient Details  Name: [name] MRN: [id number] Date of Birth: 1950-05-05   Medicare Important Message Given:  Yes     Tommy Medal 12/06/2018, 1:50 PM

## 2018-12-06 NOTE — Progress Notes (Signed)
[facility] Progress Note     Subjective: Some nausea. Eating minimal clears. No BM. Has not walked but set up yesterday. No flatus either at this time. Xray pending.  Objective: Vital signs in last 24 hours: Temp:  [98.2 F (36.8 C)-99.1 F (37.3 C)] 98.2 F (36.8 C) (09/25 0800) Pulse Rate:  [54-75] 63 (09/25 0800) Resp:  [18-20] 18 (09/25 0800) BP: (143-179)/(57-77) 157/77 (09/25 0800) SpO2:  [100 %] 100 % (09/25 0800) Last BM Date: 12/04/18  Intake/Output from previous day: 09/24 0701 - 09/25 0700 In: 1178 [I.V.:1178] Out: 200 [Urine:200] Intake/Output this shift: Total I/O In: 240 [P.O.:240] Out: -   General appearance: alert, cooperative and no distress Resp: normal of breathing GI: soft, distended, mildly tender  Lab Results:  Recent Labs    12/04/18 0617 12/05/18 0508  WBC 10.6* 11.1*  HGB 11.6* 10.1*  HCT 41.5 35.1*  PLT 294 275   BMET Recent Labs    12/04/18 0617 12/05/18 0508  NA 141 139  K 3.3* 3.8  CL 104 105  CO2 25 23  GLUCOSE 172* 143*  BUN 19 22  CREATININE 0.85 0.99  CALCIUM 9.4 8.4*    Studies/Results: Dg Chest Port 1 View  Result Date: 12/05/2018 CLINICAL DATA:  68 year old female with NG tube placement. EXAM: PORTABLE CHEST 1 VIEW COMPARISON:  Chest radiograph dated 12/04/2018 FINDINGS: The enteric tube extends below the diaphragm with tip beyond the inferior margin of the image. No focal consolidation, pleural effusion, pneumothorax. The cardiac silhouette is within normal limits. No acute osseous pathology. IMPRESSION: Enteric tube with tip beyond the inferior margin of the image. Electronically Signed   By: Anner Crete M.D.   On: 12/05/2018 03:48   Dg Chest Port 1 View  Result Date: 12/04/2018 CLINICAL DATA:  Status post NG tube placement. EXAM: PORTABLE CHEST 1 VIEW COMPARISON:  None. FINDINGS: This exam is limited by the patient's body habitus and portable technique. NG tube is visualized to the level of  the mid to distal esophagus. Lungs are clear. Heart size is normal. IMPRESSION: Limited examination due to the patient's size and portable technique. NG tube is visualized to the mid to distal esophagus. Consider plain film of the abdomen for further assessment. Electronically Signed   By: Inge Rise M.D.   On: 12/04/2018 13:06   Dg Chest Port 1v Same Day  Result Date: 12/04/2018 CLINICAL DATA:  Nasogastric tube placement. EXAM: PORTABLE CHEST 1 VIEW COMPARISON:  12/04/2018 FINDINGS: Nasogastric tube extends into the abdomen. The tip of the tube is beyond the image but the side hole appears to be in the gastric body region. Few densities at the right lung base are suggestive for atelectasis. Otherwise, the lungs are clear. Heart size is within normal limits. Negative for a pneumothorax. IMPRESSION: Nasogastric tube extends into the stomach. Electronically Signed   By: Markus Daft M.D.   On: 12/04/2018 21:13   Dg Abd 2 Views  Result Date: 12/05/2018 CLINICAL DATA:  Abdominal pain and distension. EXAM: ABDOMEN - 2 VIEW COMPARISON:  CT scan 12/04/2018 FINDINGS: The lung bases are grossly clear. Improved bowel gas pattern. There are persistent air-filled small bowel loops in the mid abdomen with scattered air-fluid levels but the distension is not as significant and there is now much more air throughout the colon. No free air. The soft tissue shadows are maintained. IMPRESSION: Improved bowel gas pattern suggesting resolving small-bowel obstruction. Electronically Signed   By: Ricky Stabs.D.  On: 12/05/2018 11:59    Assessment/Plan: Ms. Opie is a 68 yo with SBO. NG came out and she was having flatus so started clears yesterday. Xray pending today.   -May have to get NG back -Can attempt SBFT this weekend at bedside if does not resolve  -Continue suppository up to BID    LOS: 2 days    Virl Cagey 12/06/2018

## 2018-12-06 NOTE — Plan of Care (Signed)
  Problem: Education: Goal: Knowledge of General Education information will improve Description Including pain rating scale, medication(s)/side effects and non-pharmacologic comfort measures Outcome: Progressing   

## 2018-12-07 ENCOUNTER — Inpatient Hospital Stay (HOSPITAL_COMMUNITY): Payer: Medicare HMO

## 2018-12-07 LAB — CBC
HCT: 35.8 % — ABNORMAL LOW (ref 36.0–46.0)
Hemoglobin: 9.8 g/dL — ABNORMAL LOW (ref 12.0–15.0)
MCH: 22.3 pg — ABNORMAL LOW (ref 26.0–34.0)
MCHC: 27.4 g/dL — ABNORMAL LOW (ref 30.0–36.0)
MCV: 81.5 fL (ref 80.0–100.0)
Platelets: 211 10*3/uL (ref 150–400)
RBC: 4.39 MIL/uL (ref 3.87–5.11)
RDW: 14.6 % (ref 11.5–15.5)
WBC: 5.9 10*3/uL (ref 4.0–10.5)
nRBC: 0 % (ref 0.0–0.2)

## 2018-12-07 LAB — RENAL FUNCTION PANEL
Albumin: 3.5 g/dL (ref 3.5–5.0)
Anion gap: 5 (ref 5–15)
BUN: 11 mg/dL (ref 8–23)
CO2: 28 mmol/L (ref 22–32)
Calcium: 8.9 mg/dL (ref 8.9–10.3)
Chloride: 109 mmol/L (ref 98–111)
Creatinine, Ser: 0.75 mg/dL (ref 0.44–1.00)
GFR calc Af Amer: >60 mL/min (ref >60)
GFR calc non Af Amer: >60 mL/min (ref >60)
Glucose, Bld: 102 mg/dL — ABNORMAL HIGH (ref 70–99)
Phosphorus: 3.2 mg/dL (ref 2.5–4.6)
Potassium: 4.1 mmol/L (ref 3.5–5.1)
Sodium: 142 mmol/L (ref 135–145)

## 2018-12-07 LAB — GLUCOSE, CAPILLARY
Glucose-Capillary: 113 mg/dL — ABNORMAL HIGH (ref 70–99)
Glucose-Capillary: 124 mg/dL — ABNORMAL HIGH (ref 70–99)
Glucose-Capillary: 138 mg/dL — ABNORMAL HIGH (ref 70–99)
Glucose-Capillary: 118 mg/dL — ABNORMAL HIGH (ref 70–99)

## 2018-12-07 MED ORDER — AMLODIPINE BESYLATE 5 MG PO TABS
5.0000 mg | ORAL_TABLET | Freq: Every day | ORAL | Status: DC
  Administered 2018-12-07 – 2018-12-08 (×2): 5 mg via ORAL
  Filled 2018-12-07 (×2): qty 1

## 2018-12-07 MED ORDER — DIATRIZOATE MEGLUMINE & SODIUM 66-10 % PO SOLN
90.0000 mL | Freq: Once | ORAL | Status: AC
  Administered 2018-12-07: 12:00:00 90 mL via ORAL
  Filled 2018-12-07: qty 90

## 2018-12-07 MED ORDER — DIATRIZOATE MEGLUMINE & SODIUM 66-10 % PO SOLN
ORAL | Status: AC
  Administered 2018-12-07: 90 mL via ORAL
  Filled 2018-12-07: qty 90

## 2018-12-07 NOTE — Progress Notes (Signed)
[facility] Progress Note     Subjective: No major issues but having pain and nausea. No flatus or BM. Taking in some clears, Xray yesterday with some improvement.   Objective: Vital signs in last 24 hours: Temp:  [98.1 F (36.7 C)-98.3 F (36.8 C)] 98.2 F (36.8 C) (09/26 0539) Pulse Rate:  [55-62] 60 (09/26 0924) Resp:  [17-18] 17 (09/26 0539) BP: (129-153)/(49-127) 152/127 (09/26 0924) SpO2:  [99 %-100 %] 99 % (09/26 0539) Last BM Date: 12/04/18  Intake/Output from previous day: 09/25 0701 - 09/26 0700 In: 516 [P.O.:516] Out: 2850 [Urine:2850] Intake/Output this shift: Total I/O In: 120 [P.O.:120] Out: -   General appearance: alert, cooperative and no distress Resp: normal work of breathing GI: soft, minimally distended, mildly tender  Lab Results:  Recent Labs    12/05/18 0508 12/07/18 0925  WBC 11.1* 5.9  HGB 10.1* 9.8*  HCT 35.1* 35.8*  PLT 275 211   BMET Recent Labs    12/05/18 0508 12/07/18 0925  NA 139 142  K 3.8 4.1  CL 105 109  CO2 23 28  GLUCOSE 143* 102*  BUN 22 11  CREATININE 0.99 0.75  CALCIUM 8.4* 8.9   PT/INR No results for input(s): LABPROT, INR in the last 72 hours.  Studies/Results: Dg Abd 2 Views  Result Date: 12/06/2018 CLINICAL DATA:  ABD pain and distention x several days. Hx cholecystectomy, hysterectomy EXAM: ABDOMEN - 2 VIEW COMPARISON:  Abdominal x-ray 12/05/2018 FINDINGS: Persistent mildly dilated loops of small bowel in the mid abdomen with gas distally similar to the prior study. No evidence of free air. No acute finding in the visualized skeleton. IMPRESSION: Persistent mildly dilated loops of small bowel with gas distally, similar to the prior study, may represent resolving small-bowel obstruction. Electronically Signed   By: Audie Pinto M.D.   On: 12/06/2018 14:54   Dg Abd 2 Views  Result Date: 12/05/2018 CLINICAL DATA:  Abdominal pain and distension. EXAM: ABDOMEN - 2 VIEW COMPARISON:  CT scan  12/04/2018 FINDINGS: The lung bases are grossly clear. Improved bowel gas pattern. There are persistent air-filled small bowel loops in the mid abdomen with scattered air-fluid levels but the distension is not as significant and there is now much more air throughout the colon. No free air. The soft tissue shadows are maintained. IMPRESSION: Improved bowel gas pattern suggesting resolving small-bowel obstruction. Electronically Signed   By: Marijo Sanes M.D.   On: 12/05/2018 11:59    Assessment/Plan: Ms. Kluk is a 68 yo with a SBO. Having some nausea still but Xray improving. No flatus or BM. -SBFT at bedside today. RN aware and Radiology aware. -Discussed with Dr. Denton Brick.     LOS: 3 days    Virl Cagey 12/07/2018

## 2018-12-07 NOTE — Progress Notes (Signed)
Patient has had several bowel movements today and has requested medication to "stop it" Patient has been informed that due to SBO she is unable to get an antidiarrheal medication. MD has been informed.

## 2018-12-07 NOTE — Progress Notes (Signed)
Patient Demographics:    [name], is a 68 y.o. female, DOB - 03-Feb-1951, [id number]  Admit date - 12/04/2018   Admitting Physician Marico Buckle Denton Brick, MD  Outpatient Primary MD for the patient is [name], MD  LOS - 3  Chief Complaint  Patient presents with  . Abdominal Pain        Subjective:    [name] today has no fevers, no emesis,  No chest pain,  ---  Tolerating liquid diet, however has abd discomfort with liquid diet,  no BM or flatus   Assessment  & Plan :    Principal Problem:   SBO (small bowel obstruction) (HCC) Active Problems:   Essential hypertension   Bipolar disorder (HCC)   Seizure disorder (HCC)   Hypothyroidism   COPD mixed type (HCC)   Tobacco user   Chronic pain syndrome   1)SBO--- recurrent with history of same , CT abd/Pelvis with Small bowel obstruction with evidence of transition zone and proximal ileal region.  - -Abdominal discomfort and nausea with liquid diet intake, -Patient is reluctant to have NG tube placed back again -Repeat abdominal x-rays on 12/06/2018 shows resolving SBO, general surgeon states okay to try clear liquid diet for now -Plan is for  SBFT on 12/07/18 --Antiemetics and pain management as ordered -Continue IV fluids until oral intake is reliable  2)Febrile Illness--- no further fevers,?? related to #1 above, chest imaging study without pneumonia, UA not suggestive of UTI, blood cultures  NGTD, hold off on antibiotics  3)HTN-continue metoprolol, hold losartan and c/n amlodipine  -may use IV labetalol when necessary  Every 4 hours for systolic blood pressure over 160 mmhg  4)DM2--- PTA patient was on Lantus insulin 20 units daily, restart Lantus 6 units nightly along with sliding scale insulin while restarting liquid diet  5)Chronic pain/Depression--stable, continue lorazepam, risperidone, Zoloft, trazodone, Lamictal, Lyrica  and Skelaxin.-Hold Oxycodone, may use IV pain medications  6)Hypothyroidism---  continue levothyroxine 50 mcg daily ,  7)History of seizures--no recent seizures, continue Lamictal, PRN benzos  Disposition/Need for in-Hospital Stay- patient unable to be discharged at this time due to SBO initially requiring NG tube and IV fluids until oral intake is more reliable -awaiting SBFT   Code Status : full  Family Communication:   NA (patient is alert, awake and coherent)  Disposition Plan  : home when able to tolerate oral intake without GI symptoms worsening  Consults  :  gen surgery  DVT Prophylaxis  :  - Heparin - SCDs   Lab Results  Component Value Date   PLT 211 12/07/2018   Inpatient Medications  Scheduled Meds: . bisacodyl  10 mg Rectal BID  . heparin  5,000 Units Subcutaneous Q8H  . insulin aspart  0-15 Units Subcutaneous TID WC  . insulin aspart  0-5 Units Subcutaneous QHS  . insulin glargine  6 Units Subcutaneous QHS  . lamoTRIgine  100 mg Oral BID  . levothyroxine  50 mcg Oral Q0600  . metoprolol tartrate  50 mg Oral BID  . pregabalin  75 mg Oral Daily  . risperiDONE  0.5 mg Oral QHS  . rosuvastatin  5 mg Oral QHS  . sertraline  100 mg Oral Daily  . sodium chloride flush  3 mL Intravenous Q12H  . traZODone  50 mg Oral QHS   Continuous Infusions: . sodium chloride    . sodium chloride 100 mL/hr at 12/07/18 0515   PRN Meds:.sodium chloride, acetaminophen **OR** acetaminophen, albuterol, HYDROmorphone (DILAUDID) injection, labetalol, LORazepam, ondansetron (ZOFRAN) IV, phenol, polyethylene glycol, sodium chloride flush   Anti-infectives (From admission, onward)   None       Objective:   Vitals:   12/06/18 2111 12/07/18 0539 12/07/18 0924 12/07/18 1313  BP: (!) 153/60 (!) 129/49 (!) 152/127 (!) 149/53  Pulse: 62 (!) 55 60 61  Resp: 18 17  18   Temp: 98.3 F (36.8 C) 98.2 F (36.8 C)  98 F (36.7 C)  TempSrc: Oral Oral    SpO2: 99% 99%  100%  Weight:       Height:       Wt Readings from Last 3 Encounters:  12/04/18 78.5 kg  10/10/18 79.1 kg  09/30/18 80.7 kg     Intake/Output Summary (Last 24 hours) at 12/07/2018 1339 Last data filed at 12/07/2018 1300 Gross per 24 hour  Intake 516 ml  Output 3450 ml  Net -2934 ml   Physical Exam Gen:- Awake Alert, in no apparent distress  HEENT:- [place].AT, No sclera icterus Neck-Supple Neck,No JVD,.  Lungs-  CTAB , fair symmetrical air movement CV- S1, S2 normal, regular  Abd- Abdomen is less distended, generalized abdominal tenderness, bowel sounds are improving prior hysterectomy and cholecystectomy scars noted,  Extremity/Skin:- No  edema, pedal pulses present  Psych-affect is appropriate, oriented x3 Neuro-no new focal deficits, no tremors   Data Review:   Micro Results Recent Results (from the past 240 hour(s))  SARS CORONAVIRUS 2 (TAT 6-24 HRS) Nasopharyngeal Nasopharyngeal Swab     Status: None   Collection Time: 12/04/18 10:12 AM   Specimen: Nasopharyngeal Swab  Result Value Ref Range Status   SARS Coronavirus 2 NEGATIVE NEGATIVE Final    Comment: (NOTE) SARS-CoV-2 target nucleic acids are NOT DETECTED. The SARS-CoV-2 RNA is generally detectable in upper and lower respiratory specimens during the acute phase of infection. Negative results do not preclude SARS-CoV-2 infection, do not rule out co-infections with other pathogens, and should not be used as the sole basis for treatment or other patient management decisions. Negative results must be combined with clinical observations, patient history, and epidemiological information. The expected result is Negative. Fact Sheet for Patients: SugarRoll.be Fact Sheet for Healthcare Providers: https://www.woods-mathews.com/ This test is not yet approved or cleared by the [place] FDA and  has been authorized for detection and/or diagnosis of SARS-CoV-2 by FDA under an Emergency Use  Authorization (EUA). This EUA will remain  in effect (meaning this test can be used) for the duration of the COVID-19 declaration under Section 56 4(b)(1) of the Act, 21 U.S.C. section 360bbb-3(b)(1), unless the authorization is terminated or revoked sooner. Performed at [facility], [place] [address] [place], [place] [address]   Culture, blood (Routine X 2) w Reflex to ID Panel     Status: None (Preliminary result)   Collection Time: 12/04/18  6:39 PM   Specimen: BLOOD  Result Value Ref Range Status   Specimen Description BLOOD LEFT ANTECUBITAL  Final   Special Requests   Final    BOTTLES DRAWN AEROBIC AND ANAEROBIC Blood Culture adequate volume   Culture   Final    NO GROWTH 3 DAYS Performed at [facility], [address], [place], [place] [address]    Report Status PENDING  Incomplete  Culture, blood (Routine X 2) w Reflex to ID Panel     Status: None (Preliminary result)   Collection Time: 12/04/18  6:39 PM   Specimen: BLOOD LEFT HAND  Result Value Ref Range Status   Specimen Description BLOOD LEFT HAND  Final   Special Requests   Final    BOTTLES DRAWN AEROBIC AND ANAEROBIC Blood Culture adequate volume   Culture   Final    NO GROWTH 3 DAYS Performed at [facility], [address] [place], [place] [address]    Report Status PENDING  Incomplete    Radiology Reports Ct Abdomen Pelvis W Contrast  Result Date: 12/04/2018 CLINICAL DATA:  Abdominal distension and nausea EXAM: CT ABDOMEN AND PELVIS WITH CONTRAST TECHNIQUE: Multidetector CT imaging of the abdomen and pelvis was performed using the standard protocol following bolus administration of intravenous contrast. CONTRAST:  130mL OMNIPAQUE IOHEXOL 300 MG/ML  SOLN COMPARISON:  June 28, 2017 FINDINGS: Lower chest: There is bibasilar atelectatic change, more on the left than on the right. There is no frank airspace consolidation in the bases. There is a small hiatal hernia. Hepatobiliary: No focal liver lesions are  evident. The gallbladder is absent. There is no biliary duct dilatation. Pancreas: No pancreatic mass or inflammatory focus. Spleen: No splenic lesions are evident. Adrenals/Urinary Tract: Adrenals bilaterally appear unremarkable. Kidneys bilaterally show no evident mass or hydronephrosis on either side. No evident renal or ureteral calculus on either side. Urinary bladder is midline with wall thickness within normal limits. Stomach/Bowel: The stomach is mildly distended with fluid and air. There is dilatation of much of the small bowel with transition zone in the anterior right abdomen in the proximal ileal region consistent with a degree of small bowel obstruction. Terminal ileum appears normal. There is no free air or portal venous air. No evident diverticulitis. Colon is largely decompressed. Vascular/Lymphatic: There is aortic and iliac artery atherosclerosis. No aneurysm evident. No adenopathy evident in the abdomen or pelvis. Reproductive: Uterus absent.  No evident pelvic mass. Other: Appendix appears normal. No abscess or ascites is evident in the abdomen or pelvis. Musculoskeletal: There is degenerative change in the lower thoracic and lumbar regions. There are no blastic or lytic bone lesions. There is no intramuscular or abdominal wall lesions. IMPRESSION: 1. Small bowel obstruction with evidence of transition zone and proximal ileal region. No free air. Terminal ileum appears normal. 2.  Small hiatal hernia. 3.  No abscess in the abdomen or pelvis.  Appendix appears normal. 4.  Gallbladder absent.  Uterus absent. 5. No renal or ureteral calculus. No hydronephrosis. Urinary bladder wall thickness within normal limits. Electronically Signed   By: Lowella Grip III M.D.   On: 12/04/2018 09:06   Dg Chest Port 1 View  Result Date: 12/05/2018 CLINICAL DATA:  68 year old female with NG tube placement. EXAM: PORTABLE CHEST 1 VIEW COMPARISON:  Chest radiograph dated 12/04/2018 FINDINGS: The enteric tube  extends below the diaphragm with tip beyond the inferior margin of the image. No focal consolidation, pleural effusion, pneumothorax. The cardiac silhouette is within normal limits. No acute osseous pathology. IMPRESSION: Enteric tube with tip beyond the inferior margin of the image. Electronically Signed   By: Anner Crete M.D.   On: 12/05/2018 03:48   Dg Chest Port 1 View  Result Date: 12/04/2018 CLINICAL DATA:  Status post NG tube placement. EXAM: PORTABLE CHEST 1 VIEW COMPARISON:  None. FINDINGS: This exam is limited by the patient's body habitus and portable technique. NG  tube is visualized to the level of the mid to distal esophagus. Lungs are clear. Heart size is normal. IMPRESSION: Limited examination due to the patient's size and portable technique. NG tube is visualized to the mid to distal esophagus. Consider plain film of the abdomen for further assessment. Electronically Signed   By: Inge Rise M.D.   On: 12/04/2018 13:06   Dg Chest Port 1v Same Day  Result Date: 12/04/2018 CLINICAL DATA:  Nasogastric tube placement. EXAM: PORTABLE CHEST 1 VIEW COMPARISON:  12/04/2018 FINDINGS: Nasogastric tube extends into the abdomen. The tip of the tube is beyond the image but the side hole appears to be in the gastric body region. Few densities at the right lung base are suggestive for atelectasis. Otherwise, the lungs are clear. Heart size is within normal limits. Negative for a pneumothorax. IMPRESSION: Nasogastric tube extends into the stomach. Electronically Signed   By: Markus Daft M.D.   On: 12/04/2018 21:13   Dg Abd 2 Views  Result Date: 12/06/2018 CLINICAL DATA:  ABD pain and distention x several days. Hx cholecystectomy, hysterectomy EXAM: ABDOMEN - 2 VIEW COMPARISON:  Abdominal x-ray 12/05/2018 FINDINGS: Persistent mildly dilated loops of small bowel in the mid abdomen with gas distally similar to the prior study. No evidence of free air. No acute finding in the visualized skeleton.  IMPRESSION: Persistent mildly dilated loops of small bowel with gas distally, similar to the prior study, may represent resolving small-bowel obstruction. Electronically Signed   By: Audie Pinto M.D.   On: 12/06/2018 14:54   Dg Abd 2 Views  Result Date: 12/05/2018 CLINICAL DATA:  Abdominal pain and distension. EXAM: ABDOMEN - 2 VIEW COMPARISON:  CT scan 12/04/2018 FINDINGS: The lung bases are grossly clear. Improved bowel gas pattern. There are persistent air-filled small bowel loops in the mid abdomen with scattered air-fluid levels but the distension is not as significant and there is now much more air throughout the colon. No free air. The soft tissue shadows are maintained. IMPRESSION: Improved bowel gas pattern suggesting resolving small-bowel obstruction. Electronically Signed   By: Marijo Sanes M.D.   On: 12/05/2018 11:59   Dg Abd 2 Views  Result Date: 12/04/2018 CLINICAL DATA:  Acute abdominal swelling EXAM: ABDOMEN - 2 VIEW COMPARISON:  Abdominal CT 06/28/2017 FINDINGS: Gas distended stomach and small bowel loops with a paucity of colonic gas. Small bowel loops measure up to 3.5 cm diameter. No convincing pneumoperitoneum when allowing for probable gastric gas at the level of the epigastrium. Cholecystectomy clips. IMPRESSION: Gastric and small bowel distension favoring obstruction. Electronically Signed   By: Monte Fantasia M.D.   On: 12/04/2018 05:54   Dg Abd Portable 1v-small Bowel Protocol-position Verification  Result Date: 12/07/2018 CLINICAL DATA:  SBO, Scout film for SB with 8 hour film EXAM: PORTABLE ABDOMEN - 1 VIEW COMPARISON:  12/06/2018 FINDINGS: Mildly dilated loops of small bowel in the mid abdomen measuring 3.5 cm similar to prior. Gas appears within the rectum. IMPRESSION: Small bowel obstruction versus ileus pattern. Electronically Signed   By: Suzy Bouchard M.D.   On: 12/07/2018 13:30   Mm 3d Screen Breast Bilateral  Result Date: 11/25/2018 CLINICAL DATA:   Screening. EXAM: DIGITAL SCREENING BILATERAL MAMMOGRAM WITH TOMO AND CAD COMPARISON:  Previous exam(s). ACR Breast Density Category a: The breast tissue is almost entirely fatty. FINDINGS: There are no findings suspicious for malignancy. Images were processed with CAD. IMPRESSION: No mammographic evidence of malignancy. A result letter of this screening mammogram will  be mailed directly to the patient. RECOMMENDATION: Screening mammogram in one year. (Code:SM-B-01Y) BI-RADS CATEGORY  1: Negative. Electronically Signed   By: Franki Cabot M.D.   On: 11/25/2018 16:40     CBC Recent Labs  Lab 12/04/18 0617 12/05/18 0508 12/07/18 0925  WBC 10.6* 11.1* 5.9  HGB 11.6* 10.1* 9.8*  HCT 41.5 35.1* 35.8*  PLT 294 275 211  MCV 78.9* 78.3* 81.5  MCH 22.1* 22.5* 22.3*  MCHC 28.0* 28.8* 27.4*  RDW 15.1 15.0 14.6  LYMPHSABS 1.2  --   --   MONOABS 0.6  --   --   EOSABS 0.2  --   --   BASOSABS 0.1  --   --     Chemistries  Recent Labs  Lab 12/04/18 0617 12/05/18 0508 12/07/18 0925  NA 141 139 142  K 3.3* 3.8 4.1  CL 104 105 109  CO2 25 23 28   GLUCOSE 172* 143* 102*  BUN 19 22 11   CREATININE 0.85 0.99 0.75  CALCIUM 9.4 8.4* 8.9  AST 20  --   --   ALT 23  --   --   ALKPHOS 97  --   --   BILITOT 0.3  --   --    ------------------------------------------------------------------------------------------------------------------ No results for input(s): CHOL, HDL, LDLCALC, TRIG, CHOLHDL, LDLDIRECT in the last 72 hours.  Lab Results  Component Value Date   HGBA1C 5.4 09/30/2018   ------------------------------------------------------------------------------------------------------------------ No results for input(s): TSH, T4TOTAL, T3FREE, THYROIDAB in the last 72 hours.  Invalid input(s): FREET3 ------------------------------------------------------------------------------------------------------------------ No results for input(s): VITAMINB12, FOLATE, FERRITIN, TIBC, IRON, RETICCTPCT in  the last 72 hours.  Coagulation profile No results for input(s): INR, PROTIME in the last 168 hours.  No results for input(s): DDIMER in the last 72 hours.  Cardiac Enzymes No results for input(s): CKMB, TROPONINI, MYOGLOBIN in the last 168 hours.  Invalid input(s): CK ------------------------------------------------------------------------------------------------------------------    Component Value Date/Time   BNP 55.0 05/19/2017 1023   Caylan Chenard M.D on 12/07/2018 at 1:39 PM  Go to [email or web address] - for contact info  [facility] - Office  [phone]

## 2018-12-08 ENCOUNTER — Other Ambulatory Visit: Payer: Self-pay | Admitting: Internal Medicine

## 2018-12-08 LAB — RENAL FUNCTION PANEL
Albumin: 3.3 g/dL — ABNORMAL LOW (ref 3.5–5.0)
Anion gap: 7 (ref 5–15)
BUN: 7 mg/dL — ABNORMAL LOW (ref 8–23)
CO2: 24 mmol/L (ref 22–32)
Calcium: 8.6 mg/dL — ABNORMAL LOW (ref 8.9–10.3)
Chloride: 109 mmol/L (ref 98–111)
Creatinine, Ser: 0.74 mg/dL (ref 0.44–1.00)
GFR calc Af Amer: >60 mL/min
GFR calc non Af Amer: >60 mL/min
Glucose, Bld: 123 mg/dL — ABNORMAL HIGH (ref 70–99)
Phosphorus: 3.3 mg/dL (ref 2.5–4.6)
Potassium: 3.3 mmol/L — ABNORMAL LOW (ref 3.5–5.1)
Sodium: 140 mmol/L (ref 135–145)

## 2018-12-08 LAB — GLUCOSE, CAPILLARY
Glucose-Capillary: 104 mg/dL — ABNORMAL HIGH (ref 70–99)
Glucose-Capillary: 142 mg/dL — ABNORMAL HIGH (ref 70–99)

## 2018-12-08 MED ORDER — LEVOTHYROXINE SODIUM 50 MCG PO TABS: 50.0000 ug | ORAL_TABLET | Freq: Every day | ORAL | 5 refills | Status: DC

## 2018-12-08 MED ORDER — BISACODYL 10 MG RE SUPP: 10.0000 mg | Freq: Every day | RECTAL | 2 refills | Status: DC | PRN

## 2018-12-08 MED ORDER — POLYVINYL ALCOHOL 1.4 % OP SOLN
2.0000 [drp] | OPHTHALMIC | Status: DC | PRN
  Administered 2018-12-08: 2 [drp] via OPHTHALMIC
  Filled 2018-12-08: qty 15

## 2018-12-08 MED ORDER — ASPIRIN EC 81 MG PO TBEC: 81.0000 mg | DELAYED_RELEASE_TABLET | Freq: Every day | ORAL | 2 refills | Status: DC

## 2018-12-08 MED ORDER — METOPROLOL TARTRATE 50 MG PO TABS: 50.0000 mg | ORAL_TABLET | Freq: Two times a day (BID) | ORAL | 2 refills | Status: DC

## 2018-12-08 MED ORDER — LAMOTRIGINE 100 MG PO TABS: 100.0000 mg | ORAL_TABLET | Freq: Two times a day (BID) | ORAL | 2 refills | Status: DC

## 2018-12-08 MED ORDER — AMLODIPINE BESYLATE 10 MG PO TABS: 10.0000 mg | ORAL_TABLET | Freq: Every day | ORAL | 3 refills | Status: DC

## 2018-12-08 MED ORDER — ACETAMINOPHEN 325 MG PO TABS: 650.0000 mg | ORAL_TABLET | Freq: Four times a day (QID) | ORAL | 1 refills | Status: DC | PRN

## 2018-12-08 MED ORDER — ROSUVASTATIN CALCIUM 5 MG PO TABS: 5.0000 mg | ORAL_TABLET | Freq: Every day | ORAL | 2 refills | Status: DC

## 2018-12-08 MED ORDER — RISPERIDONE 0.5 MG PO TABS: 0.5000 mg | ORAL_TABLET | Freq: Every day | ORAL | 2 refills | Status: DC

## 2018-12-08 NOTE — Discharge Instructions (Signed)
1) soft diet advised 2) please take medication as prescribed 3) may use Dulcolax  suppository per rectum as needed for constipation 4) follow-up primary care physician within a week for recheck and reevaluation and repeat blood work

## 2018-12-08 NOTE — Progress Notes (Signed)
Nsg Discharge Note  Admit Date:  12/04/2018 Discharge date: 12/08/2018   [name] to be D/C'd Home per MD order.  AVS completed.  Copy for chart, and copy for patient signed, and dated. Patient/caregiver able to verbalize understanding.  Discharge Medication: Allergies as of 12/08/2018      Reactions   Cephalexin Hives   Iron Nausea And Vomiting   Milk-related Compounds Other (See Comments)   Doesn't agree with stomach.    Penicillins Hives   Has patient had a PCN reaction causing immediate rash, facial/tongue/throat swelling, SOB or lightheadedness with hypotension: Yes Has patient had a PCN reaction causing severe rash involving mucus membranes or skin necrosis: No Has patient had a PCN reaction that required hospitalization No Has patient had a PCN reaction occurring within the last 10 years: No If all of the above answers are "NO", then may proceed with Cephalosporin use.   Phenazopyridine Hcl Hives           Medication List    TAKE these medications   acetaminophen 325 MG tablet Commonly known as: TYLENOL Take 2 tablets (650 mg total) by mouth every 6 (six) hours as needed for mild pain or headache (or Fever >/= 101).   albuterol 108 (90 Base) MCG/ACT inhaler Commonly known as: VENTOLIN HFA Inhale 1-2 puffs every 6 hours as needed for wheezing, shortness of breath   amLODipine 10 MG tablet Commonly known as: NORVASC Take 1 tablet (10 mg total) by mouth daily.   ascorbic acid 500 MG tablet Commonly known as: VITAMIN C Take 500 mg by mouth daily.   aspirin EC 81 MG tablet Take 1 tablet (81 mg total) by mouth daily with breakfast. What changed: when to take this   bisacodyl 10 MG suppository Commonly known as: DULCOLAX Place 1 suppository (10 mg total) rectally daily as needed for mild constipation or moderate constipation.   cetirizine 10 MG tablet Commonly known as: ZYRTEC Take 1 tablet (10 mg total) by mouth daily.   clobetasol cream 0.05 % Commonly  known as: TEMOVATE Apply 1 application topically 2 (two) times daily.   HM MULTIVITAMIN ADULT GUMMY PO Take 1 tablet by mouth daily.   insulin aspart 100 UNIT/ML FlexPen Commonly known as: NovoLOG FlexPen INJECT 10 TO 14 UNITS INTO THE SKIN 3 (THREE) TIMES DAILY WITH MEALS.   Insulin Glargine 300 UNIT/ML Sopn Commonly known as: Toujeo SoloStar Inject 20 Units into the skin at bedtime.   lamoTRIgine 100 MG tablet Commonly known as: LAMICTAL Take 1 tablet (100 mg total) by mouth 2 (two) times daily.   levothyroxine 50 MCG tablet Commonly known as: SYNTHROID Take 1 tablet (50 mcg total) by mouth daily before breakfast. What changed: See the new instructions.   LORazepam 0.5 MG tablet Commonly known as: Ativan Take 1 tablet (0.5 mg total) by mouth 2 (two) times daily.   losartan 50 MG tablet Commonly known as: COZAAR TAKE 1 TABLET EVERY DAY   metaxalone 800 MG tablet Commonly known as: SKELAXIN metaxalone 800 mg tablet  take 1 tablet by mouth 3 times daily as needed.   metoprolol tartrate 50 MG tablet Commonly known as: LOPRESSOR Take 1 tablet (50 mg total) by mouth 2 (two) times daily. What changed: See the new instructions.   montelukast 10 MG tablet Commonly known as: SINGULAIR TAKE 1 TABLET EVERY DAY   nystatin powder Commonly known as: MYCOSTATIN/NYSTOP Apply to affected areas four times daily as needed.   oxyCODONE-acetaminophen 10-325 MG tablet Commonly  known as: PERCOCET   pregabalin 75 MG capsule Commonly known as: LYRICA Take 1 capsule (75 mg total) by mouth daily.   RABEprazole 20 MG tablet Commonly known as: ACIPHEX TAKE 1 TABLET TWICE DAILY   Restasis 0.05 % ophthalmic emulsion Generic drug: cycloSPORINE Place 1 drop into both eyes 2 (two) times daily.   risperiDONE 0.5 MG tablet Commonly known as: RisperDAL Take 1 tablet (0.5 mg total) by mouth at bedtime.   rosuvastatin 5 MG tablet Commonly known as: CRESTOR Take 1 tablet (5 mg total)  by mouth at bedtime.   sertraline 100 MG tablet Commonly known as: ZOLOFT Take 1 tablet (100 mg total) by mouth daily.   Tiotropium Bromide-Olodaterol 2.5-2.5 MCG/ACT Aers Commonly known as: Stiolto Respimat Inhale 2 puffs into the lungs daily.   traZODone 100 MG tablet Commonly known as: DESYREL Take 1 tablet (100 mg total) by mouth at bedtime.   triamcinolone ointment 0.1 % Commonly known as: KENALOG Apply 1 application topically at bedtime. Apply to elbows at bedtime.   Vitamin D3 125 MCG (5000 UT) Caps Take 1 capsule by mouth daily.       Discharge Assessment: Vitals:   12/08/18 0504 12/08/18 0858  BP: (!) 158/64 (!) 177/64  Pulse: (!) 59   Resp: 16   Temp: 98.8 F (37.1 C)   SpO2: 96%    Skin clean, dry and intact without evidence of skin break down, no evidence of skin tears noted. IV catheter discontinued intact. Site without signs and symptoms of complications - no redness or edema noted at insertion site, patient denies c/o pain - only slight tenderness at site.  Dressing with slight pressure applied.  D/c Instructions-Education: Discharge instructions given to patient with verbalized understanding. D/c education completed with patient including follow up instructions, medication list, d/c activities limitations if indicated, with other d/c instructions as indicated by MD - patient able to verbalize understanding, all questions fully answered. Patient instructed to return to ED, call 911, or call MD for any changes in condition.  Patient escorted via [place], and D/C home via private auto.  Berton Bon, RN 12/08/2018 2:14 PM

## 2018-12-08 NOTE — TOC Transition Note (Signed)
Transition of Care [facility]) - CM/SW Discharge Note   Patient Details  Name: [name] MRN: [id number] Date of Birth: 1950/12/05  [facility] CM/SW Contact:  Latanya Maudlin, RN Phone Number: 12/08/2018, 12:02 PM   Clinical Narrative:  Patient to be discharged per MD order. Orders in place for home health services. [facility] and wishes to resume. Notified Michelle. No DME needs.     Final next level of care: Home w Home Health Services Barriers to Discharge: No Barriers Identified   Patient Goals and CMS Choice   CMS Medicare.gov Compare Post Acute Care list provided to:: Patient Choice offered to / list presented to : Patient  Discharge Placement                       Discharge Plan and Services                          HH Arranged: PT [facility] Agency: [facility] Date [place]: 12/08/18 Time [place]: 1202 Representative spoke with at [place]: [place] ([place]) Interventions     Readmission Risk Interventions No flowsheet data found.

## 2018-12-08 NOTE — Discharge Summary (Signed)
[name], is a 68 y.o. female  DOB 01/13/1951  MRN [id number].  Admission date:  12/04/2018  Admitting Physician  Roxan Hockey, MD  Discharge Date:  12/08/2018   Primary MD  [name], MD  Recommendations for primary care physician for things to follow:   - 1) soft diet advised 2) please take medication as prescribed 3) may use Dulcolax  suppository per rectum as needed for constipation 4) follow-up primary care physician within a week for recheck and reevaluation and repeat blood work  Admission Diagnosis  Abdominal distension [R14.0] Generalized abdominal pain [R10.84] Encounter for nasogastric (NG) tube placement [Z46.59] Nausea and vomiting, intractability of vomiting not specified, unspecified vomiting type [R11.2]   Discharge Diagnosis  Abdominal distension [R14.0] Generalized abdominal pain [R10.84] Encounter for nasogastric (NG) tube placement [Z46.59] Nausea and vomiting, intractability of vomiting not specified, unspecified vomiting type [R11.2]    Principal Problem:   SBO (small bowel obstruction) ([place]) Active Problems:   Essential hypertension   Bipolar disorder ([place])   Seizure disorder ([place])   Hypothyroidism   COPD mixed type ([place])   Tobacco user   Chronic pain syndrome      Past Medical History:  Diagnosis Date   Allergy    Anemia    Anxiety    takes Ativan daily   Arthritis    Assistance needed for mobility    Bipolar disorder ([place])    takes Risperdal nightly   Blood transfusion    Cancer ([place])    In her gum   Carpal tunnel syndrome of right wrist 05/23/2011   Cervical disc disorder with radiculopathy of cervical region 10/31/2012   Chronic back pain    Chronic idiopathic constipation    Chronic neck and back pain    Colon polyps    COPD (chronic obstructive pulmonary disease) with chronic bronchitis ([place]) 09/16/2013   Office  Spirometry 10/30/2013-submaximal effort based on appearance of loop and curve. Numbers would fit with severe restriction but her physiologic capability may be better than this. FVC 0.91/44%, and 10.74/45%, FEV1/FVC 0.81, FEF 25-75% 1.43/69%     Depression    takes Zoloft daily   Diabetes mellitus    Type II   Diverticulosis    TCS 9/08 by Dr. Delfin Edis for diarrhea . Bx for micro scopic colitis negative.    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   Metabolic encephalopathy [id number]   Migraines    chronic headaches   Mononeuritis lower limb  Narcolepsy    Osteoporosis    Pancreatitis 2006   due to Depakote with normal EUS    Schatzki's ring    non critical / EGD with ED 8/2011with RMR   Seizures (HCC)    takes Lamictal daily.Last seizure 3 yrs ago   Sleep apnea    on CPAP   Stroke [facility])    left sided weakness, speech changes   Tubular adenoma of colon     Past Surgical History:  Procedure Laterality Date   ABDOMINAL HYSTERECTOMY  1978   BACK SURGERY  July 2012   BACTERIAL OVERGROWTH TEST N/A 05/05/2013   Procedure: BACTERIAL OVERGROWTH TEST;  Surgeon: Daneil Dolin, MD;  [facility];  Service: Endoscopy;  Laterality: N/A;  7:30   BIOPSY THYROID  2009   BRAIN SURGERY  11/2011   resection of meningioma   BREAST REDUCTION SURGERY  1994   CARDIAC CATHETERIZATION  05/10/2005   normal coronaries, normal LV systolic function and EF (Dr. Jackie Plum)   [place] Left  07/22/04   Dr. Aline Brochure   CATARACT EXTRACTION Bilateral    CHOLECYSTECTOMY  1984   COLONOSCOPY N/A 09/25/2012   [id number] diverticulosis.  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 Hiatal hernia   GIVENS CAPSULE STUDY N/A 01/15/2013   NORMAL.    IR GENERIC HISTORICAL  03/17/2016   IR RADIOLOGIST EVAL & MGMT 03/17/2016 [facility]   LESION REMOVAL N/A 05/31/2015   Procedure: REMOVAL RIGHT AND LEFT LESIONS OF MANDIBLE;  Surgeon: Diona Browner, DDS;  Location: [facility];  Service: Oral Surgery;  Laterality: N/A;   MALONEY DILATION  12/29/2010   RMR;   NM MYOCAR PERF WALL MOTION  2006   "relavtiely normal" persantine, mild anterior thinning (breast attenuation artifact), no region of scar/ischemia   OVARIAN CYST REMOVAL     RECTOCELE REPAIR N/A 06/29/2015   Procedure: POSTERIOR REPAIR (RECTOCELE);  Surgeon: Jonnie Kind, MD;  Location: [facility];  Service: Gynecology;  Laterality: N/A;   REDUCTION MAMMAPLASTY Bilateral    SPINE SURGERY  09/29/2010   Dr. Rolena Infante   surgical excision of 3 tumors from right thigh and right buttock  and left upper thigh  2010   TOOTH EXTRACTION Bilateral 12/14/2014   Procedure: REMOVAL OF BILATERAL MANDIBULAR EXOSTOSES;  Surgeon: Diona Browner, DDS;  Location: [place];  Service: Oral Surgery;  Laterality: Bilateral;   TRANSTHORACIC ECHOCARDIOGRAM  2010   EF 60-65%, mild conc LVH, grade 1 diastolic dysfunction; mildly calcified MV annulus with mildly thickened leaflets, mildly calcified MR annulus      HPI  from the history and physical done on the day of admission:   - [name]  is a 68 y.o. female with past medical history relevant for HTN, depression, DM 2, hypothyroidism, COPD/tobacco abuse, GERD and HLD as well as chronic pain syndrome who presents to the ED with abdominal pain and vomiting since 12/03/2018  -Patient is status post prior hysterectomy and prior open cholecystectomy she has history of recurrent SBO---  Patient had emesis starting 12/03/2018 without blood or  bile- --initially denied fevers or chills but post hospitalization patient was found to have a fever with temp above 101  -No productive cough, no urinary symptoms -Abdominal pain is generalized -Large BM 12/03/2018   3 small formed BMs -  In ED---  CT abd/Pelvis with Small bowel obstruction with evidence of transition zone and proximal ileal region. -UA with glucosuria and proteinuria without evidence of UTI WBC-10.6 -Potassium is 3.3, sodium is 141, BUN/creatinine 19 and 0.85    Hospital Course:  - 1)SBO---recurrent with history of same, CT Abd/Pelvis withSmall bowel obstruction with evidence of Transition Zone and Proximal ileal region--- --initially patient had NG tube placed, patient had SBFT on 12/07/2018--abdominal x-ray shows progression of contrast material through the colon --Patient has had almost 15 bowel movements since yesterday -Tolerating oral intake well -No significant abdominal pain, no nausea   2)Febrile Illness--- resolved, no further fevers,??related to #1 above, chest imaging study without pneumonia, UA not suggestive of UTI, blood cultures  NGTD, hold off on antibiotics  3)HTN-continue metoprolol, okay to resume losartan and c/n amlodipine -  4)DM2----resume home insulin regimen  5)Chronic pain/Depression--stable, continue lorazepam, risperidone, Zoloft, trazodone, Lamictal, Lyrica and Skelaxin.-   6)Hypothyroidism--- continue levothyroxine 50 mcg daily  ,  7)History of seizures--no recent seizures, continue Lamictal, PRN benzos  Code Status : full  Family Communication:   NA (patient is alert, awake and coherent)  Disposition Plan  : home with home health services  Consults  :  gen surgery  Discharge Condition: stable  Follow UP--- PCP within a week for recheck and reevaluation  Diet and Activity recommendation:  As advised  Discharge Instructions    Discharge Instructions    Call MD for:  difficulty breathing, headache or visual disturbances   Complete by: As directed    Call MD for:  persistant dizziness or light-headedness   Complete by: As directed    Call MD for:  persistant nausea and vomiting   Complete by: As directed    Call MD for:  severe uncontrolled pain   Complete by: As directed    Call MD for:  temperature >100.4   Complete by: As directed    Diet - low sodium heart healthy   Complete by: As directed    Discharge instructions   Complete by: As directed    1) soft diet advised 2) please take medication as prescribed 3) may use Dulcolax  suppository per rectum as needed for constipation 4) follow-up primary care physician within a week for recheck and reevaluation and repeat blood work   Increase activity slowly   Complete by: As directed         Discharge Medications     Allergies as of 12/08/2018      Reactions   Cephalexin Hives   Iron Nausea And Vomiting   Milk-related Compounds Other (See Comments)   Doesn't agree with stomach.    Penicillins Hives   Has patient had a PCN reaction causing immediate rash, facial/tongue/throat swelling, SOB or lightheadedness with hypotension: Yes Has patient had a PCN reaction causing severe rash involving mucus membranes or skin necrosis: No Has patient had a PCN reaction that required hospitalization No Has patient had a PCN reaction occurring within the last 10 years: No If all of the above answers are "NO", then may proceed with Cephalosporin use.    Phenazopyridine Hcl Hives           Medication List    TAKE these medications   acetaminophen  325 MG tablet Commonly known as: TYLENOL Take 2 tablets (650 mg total) by mouth every 6 (six) hours as needed for mild pain or headache (or Fever >/= 101).   albuterol 108 (90 Base) MCG/ACT inhaler Commonly known as: VENTOLIN HFA Inhale 1-2 puffs every 6 hours as needed for wheezing, shortness of breath   amLODipine 10 MG tablet Commonly known as: NORVASC Take 1 tablet (10 mg total) by mouth daily.   ascorbic acid 500 MG tablet Commonly known as: VITAMIN C Take 500 mg by mouth daily.   aspirin EC 81 MG tablet Take 1 tablet (81 mg total) by mouth daily with breakfast. What changed: when to take this   bisacodyl 10 MG suppository Commonly known as: DULCOLAX Place 1 suppository (10 mg total) rectally daily as needed for mild constipation or moderate constipation.   cetirizine 10 MG tablet Commonly known as: ZYRTEC Take 1 tablet (10 mg total) by mouth daily.   clobetasol cream 0.05 % Commonly known as: TEMOVATE Apply 1 application topically 2 (two) times daily.   HM MULTIVITAMIN ADULT GUMMY PO Take 1 tablet by mouth daily.   insulin aspart 100 UNIT/ML FlexPen Commonly known as: NovoLOG FlexPen INJECT 10 TO 14 UNITS INTO THE SKIN 3 (THREE) TIMES DAILY WITH MEALS.   Insulin Glargine 300 UNIT/ML Sopn Commonly known as: Toujeo SoloStar Inject 20 Units into the skin at bedtime.   lamoTRIgine 100 MG tablet Commonly known as: LAMICTAL Take 1 tablet (100 mg total) by mouth 2 (two) times daily.   levothyroxine 50 MCG tablet Commonly known as: SYNTHROID Take 1 tablet (50 mcg total) by mouth daily before breakfast. What changed: See the new instructions.   LORazepam 0.5 MG tablet Commonly known as: Ativan Take 1 tablet (0.5 mg total) by mouth 2 (two) times daily.   losartan 50 MG tablet Commonly known as: COZAAR TAKE 1 TABLET EVERY DAY   metaxalone 800 MG  tablet Commonly known as: SKELAXIN metaxalone 800 mg tablet  take 1 tablet by mouth 3 times daily as needed.   metoprolol tartrate 50 MG tablet Commonly known as: LOPRESSOR Take 1 tablet (50 mg total) by mouth 2 (two) times daily. What changed: See the new instructions.   montelukast 10 MG tablet Commonly known as: SINGULAIR TAKE 1 TABLET EVERY DAY   nystatin powder Commonly known as: MYCOSTATIN/NYSTOP Apply to affected areas four times daily as needed.   oxyCODONE-acetaminophen 10-325 MG tablet Commonly known as: PERCOCET   pregabalin 75 MG capsule Commonly known as: LYRICA Take 1 capsule (75 mg total) by mouth daily.   RABEprazole 20 MG tablet Commonly known as: ACIPHEX TAKE 1 TABLET TWICE DAILY   Restasis 0.05 % ophthalmic emulsion Generic drug: cycloSPORINE Place 1 drop into both eyes 2 (two) times daily.   risperiDONE 0.5 MG tablet Commonly known as: RisperDAL Take 1 tablet (0.5 mg total) by mouth at bedtime.   rosuvastatin 5 MG tablet Commonly known as: CRESTOR Take 1 tablet (5 mg total) by mouth at bedtime.   sertraline 100 MG tablet Commonly known as: ZOLOFT Take 1 tablet (100 mg total) by mouth daily.   Tiotropium Bromide-Olodaterol 2.5-2.5 MCG/ACT Aers Commonly known as: Stiolto Respimat Inhale 2 puffs into the lungs daily.   traZODone 100 MG tablet Commonly known as: DESYREL Take 1 tablet (100 mg total) by mouth at bedtime.   triamcinolone ointment 0.1 % Commonly known as: KENALOG Apply 1 application topically at bedtime. Apply to elbows at bedtime.   Vitamin D3 125  MCG (5000 UT) Caps Take 1 capsule by mouth daily.       Major procedures and Radiology Reports - PLEASE review detailed and final reports for all details, in brief -   Ct Abdomen Pelvis W Contrast  Result Date: 12/04/2018 CLINICAL DATA:  Abdominal distension and nausea EXAM: CT ABDOMEN AND PELVIS WITH CONTRAST TECHNIQUE: Multidetector CT imaging of the abdomen and pelvis was  performed using the standard protocol following bolus administration of intravenous contrast. CONTRAST:  134mL OMNIPAQUE IOHEXOL 300 MG/ML  SOLN COMPARISON:  June 28, 2017 FINDINGS: Lower chest: There is bibasilar atelectatic change, more on the left than on the right. There is no frank airspace consolidation in the bases. There is a small hiatal hernia. Hepatobiliary: No focal liver lesions are evident. The gallbladder is absent. There is no biliary duct dilatation. Pancreas: No pancreatic mass or inflammatory focus. Spleen: No splenic lesions are evident. Adrenals/Urinary Tract: Adrenals bilaterally appear unremarkable. Kidneys bilaterally show no evident mass or hydronephrosis on either side. No evident renal or ureteral calculus on either side. Urinary bladder is midline with wall thickness within normal limits. Stomach/Bowel: The stomach is mildly distended with fluid and air. There is dilatation of much of the small bowel with transition zone in the anterior right abdomen in the proximal ileal region consistent with a degree of small bowel obstruction. Terminal ileum appears normal. There is no free air or portal venous air. No evident diverticulitis. Colon is largely decompressed. Vascular/Lymphatic: There is aortic and iliac artery atherosclerosis. No aneurysm evident. No adenopathy evident in the abdomen or pelvis. Reproductive: Uterus absent.  No evident pelvic mass. Other: Appendix appears normal. No abscess or ascites is evident in the abdomen or pelvis. Musculoskeletal: There is degenerative change in the lower thoracic and lumbar regions. There are no blastic or lytic bone lesions. There is no intramuscular or abdominal wall lesions. IMPRESSION: 1. Small bowel obstruction with evidence of transition zone and proximal ileal region. No free air. Terminal ileum appears normal. 2.  Small hiatal hernia. 3.  No abscess in the abdomen or pelvis.  Appendix appears normal. 4.  Gallbladder absent.  Uterus  absent. 5. No renal or ureteral calculus. No hydronephrosis. Urinary bladder wall thickness within normal limits. Electronically Signed   By: Lowella Grip III M.D.   On: 12/04/2018 09:06   Dg Chest Port 1 View  Result Date: 12/05/2018 CLINICAL DATA:  68 year old female with NG tube placement. EXAM: PORTABLE CHEST 1 VIEW COMPARISON:  Chest radiograph dated 12/04/2018 FINDINGS: The enteric tube extends below the diaphragm with tip beyond the inferior margin of the image. No focal consolidation, pleural effusion, pneumothorax. The cardiac silhouette is within normal limits. No acute osseous pathology. IMPRESSION: Enteric tube with tip beyond the inferior margin of the image. Electronically Signed   By: Anner Crete M.D.   On: 12/05/2018 03:48   Dg Chest Port 1 View  Result Date: 12/04/2018 CLINICAL DATA:  Status post NG tube placement. EXAM: PORTABLE CHEST 1 VIEW COMPARISON:  None. FINDINGS: This exam is limited by the patient's body habitus and portable technique. NG tube is visualized to the level of the mid to distal esophagus. Lungs are clear. Heart size is normal. IMPRESSION: Limited examination due to the patient's size and portable technique. NG tube is visualized to the mid to distal esophagus. Consider plain film of the abdomen for further assessment. Electronically Signed   By: Inge Rise M.D.   On: 12/04/2018 13:06   Dg Chest Port 1v  Same Day  Result Date: 12/04/2018 CLINICAL DATA:  Nasogastric tube placement. EXAM: PORTABLE CHEST 1 VIEW COMPARISON:  12/04/2018 FINDINGS: Nasogastric tube extends into the abdomen. The tip of the tube is beyond the image but the side hole appears to be in the gastric body region. Few densities at the right lung base are suggestive for atelectasis. Otherwise, the lungs are clear. Heart size is within normal limits. Negative for a pneumothorax. IMPRESSION: Nasogastric tube extends into the stomach. Electronically Signed   By: Markus Daft M.D.   On:  12/04/2018 21:13   Dg Abd 2 Views  Result Date: 12/06/2018 CLINICAL DATA:  ABD pain and distention x several days. Hx cholecystectomy, hysterectomy EXAM: ABDOMEN - 2 VIEW COMPARISON:  Abdominal x-ray 12/05/2018 FINDINGS: Persistent mildly dilated loops of small bowel in the mid abdomen with gas distally similar to the prior study. No evidence of free air. No acute finding in the visualized skeleton. IMPRESSION: Persistent mildly dilated loops of small bowel with gas distally, similar to the prior study, may represent resolving small-bowel obstruction. Electronically Signed   By: Audie Pinto M.D.   On: 12/06/2018 14:54   Dg Abd 2 Views  Result Date: 12/05/2018 CLINICAL DATA:  Abdominal pain and distension. EXAM: ABDOMEN - 2 VIEW COMPARISON:  CT scan 12/04/2018 FINDINGS: The lung bases are grossly clear. Improved bowel gas pattern. There are persistent air-filled small bowel loops in the mid abdomen with scattered air-fluid levels but the distension is not as significant and there is now much more air throughout the colon. No free air. The soft tissue shadows are maintained. IMPRESSION: Improved bowel gas pattern suggesting resolving small-bowel obstruction. Electronically Signed   By: Marijo Sanes M.D.   On: 12/05/2018 11:59   Dg Abd 2 Views  Result Date: 12/04/2018 CLINICAL DATA:  Acute abdominal swelling EXAM: ABDOMEN - 2 VIEW COMPARISON:  Abdominal CT 06/28/2017 FINDINGS: Gas distended stomach and small bowel loops with a paucity of colonic gas. Small bowel loops measure up to 3.5 cm diameter. No convincing pneumoperitoneum when allowing for probable gastric gas at the level of the epigastrium. Cholecystectomy clips. IMPRESSION: Gastric and small bowel distension favoring obstruction. Electronically Signed   By: Monte Fantasia M.D.   On: 12/04/2018 05:54   Dg Abd Portable 1v-small Bowel Obstruction Protocol-initial, 8 Hr Delay  Result Date: 12/07/2018 CLINICAL DATA:  Small bowel  obstruction. 8 hour delayed film. EXAM: PORTABLE ABDOMEN - 1 VIEW COMPARISON:  Radiograph earlier this day. CT 12/04/2018 FINDINGS: 1 hour delayed demonstrates enteric contrast in the stomach and small bowel. 8 hour delay demonstrates contrast has progressed to the ascending, descending, and rectosigmoid colon. There is residual enteric contrast in the stomach and duodenum. Persistent but diminished small bowel distention in the central abdomen. IMPRESSION: Enteric contrast contrast has progressed to the ascending, descending, and rectosigmoid colon. Persistent but diminished small bowel distention in the central abdomen. Electronically Signed   By: Keith Rake M.D.   On: 12/07/2018 20:05   Dg Abd Portable 1v-small Bowel Protocol-position Verification  Result Date: 12/07/2018 CLINICAL DATA:  SBO, Scout film for SB with 8 hour film EXAM: PORTABLE ABDOMEN - 1 VIEW COMPARISON:  12/06/2018 FINDINGS: Mildly dilated loops of small bowel in the mid abdomen measuring 3.5 cm similar to prior. Gas appears within the rectum. IMPRESSION: Small bowel obstruction versus ileus pattern. Electronically Signed   By: Suzy Bouchard M.D.   On: 12/07/2018 13:30   Mm 3d Screen Breast Bilateral  Result Date: 11/25/2018 CLINICAL  DATA:  Screening. EXAM: DIGITAL SCREENING BILATERAL MAMMOGRAM WITH TOMO AND CAD COMPARISON:  Previous exam(s). ACR Breast Density Category a: The breast tissue is almost entirely fatty. FINDINGS: There are no findings suspicious for malignancy. Images were processed with CAD. IMPRESSION: No mammographic evidence of malignancy. A result letter of this screening mammogram will be mailed directly to the patient. RECOMMENDATION: Screening mammogram in one year. (Code:SM-B-01Y) BI-RADS CATEGORY  1: Negative. Electronically Signed   By: Franki Cabot M.D.   On: 11/25/2018 16:40    Micro Results    Recent Results (from the past 240 hour(s))  SARS CORONAVIRUS 2 (TAT 6-24 HRS) Nasopharyngeal  Nasopharyngeal Swab     Status: None   Collection Time: 12/04/18 10:12 AM   Specimen: Nasopharyngeal Swab  Result Value Ref Range Status   SARS Coronavirus 2 NEGATIVE NEGATIVE Final    Comment: (NOTE) SARS-CoV-2 target nucleic acids are NOT DETECTED. The SARS-CoV-2 RNA is generally detectable in upper and lower respiratory specimens during the acute phase of infection. Negative results do not preclude SARS-CoV-2 infection, do not rule out co-infections with other pathogens, and should not be used as the sole basis for treatment or other patient management decisions. Negative results must be combined with clinical observations, patient history, and epidemiological information. The expected result is Negative. Fact Sheet for Patients: SugarRoll.be Fact Sheet for Healthcare Providers: https://www.woods-mathews.com/ This test is not yet approved or cleared by the [place] FDA and  has been authorized for detection and/or diagnosis of SARS-CoV-2 by FDA under an Emergency Use Authorization (EUA). This EUA will remain  in effect (meaning this test can be used) for the duration of the COVID-19 declaration under Section 56 4(b)(1) of the Act, 21 U.S.C. section 360bbb-3(b)(1), unless the authorization is terminated or revoked sooner. Performed at [facility], [place] [address] [place], [place] [address]   Culture, blood (Routine X 2) w Reflex to ID Panel     Status: None (Preliminary result)   Collection Time: 12/04/18  6:39 PM   Specimen: BLOOD  Result Value Ref Range Status   Specimen Description BLOOD LEFT ANTECUBITAL  Final   Special Requests   Final    BOTTLES DRAWN AEROBIC AND ANAEROBIC Blood Culture adequate volume   Culture   Final    NO GROWTH 4 DAYS Performed at [facility], [address], [place], [place] [address]    Report Status PENDING  Incomplete  Culture, blood (Routine X 2) w Reflex to ID Panel     Status: None  (Preliminary result)   Collection Time: 12/04/18  6:39 PM   Specimen: BLOOD LEFT HAND  Result Value Ref Range Status   Specimen Description BLOOD LEFT HAND  Final   Special Requests   Final    BOTTLES DRAWN AEROBIC AND ANAEROBIC Blood Culture adequate volume   Culture   Final    NO GROWTH 4 DAYS Performed at [facility], [address], [place], [place] [address]    Report Status PENDING  Incomplete       Today   Subjective    [name] today has no new complaints, No fever  Or chills  No nausea vomiting, no abdominal pain, tolerating oral intake well,          Patient has been seen and examined prior to discharge   Objective   Blood pressure (!) 177/64, pulse (!) 59, temperature 98.8 F (37.1 C), temperature source Oral, resp. rate 16, height 5\' 1"  (1.549 m), weight  78.5 kg, SpO2 96 %.   Intake/Output Summary (Last 24 hours) at 12/08/2018 1137 Last data filed at 12/08/2018 0900 Gross per 24 hour  Intake 1275 ml  Output 1200 ml  Net 75 ml    Exam Gen:- Awake Alert, no acute distress  HEENT:- [place].AT, No sclera icterus Neck-Supple Neck,No JVD,.  Lungs-  CTAB , good air movement bilaterally  CV- S1, S2 normal, regular Abd-  +ve B.Sounds, Abd Soft, No significant distention or tenderness, prior hysterectomy and cholecystectomy scars noted, Extremity/Skin:- No  edema,   good pulses Psych-affect is appropriate, oriented x3 Neuro-no new focal deficits, no tremors    Data Review   CBC w Diff:  Lab Results  Component Value Date   WBC 5.9 12/07/2018   HGB 9.8 (L) 12/07/2018   HGB 10.3 (L) 01/29/2018   HCT 35.8 (L) 12/07/2018   HCT 33.8 (L) 01/29/2018   PLT 211 12/07/2018   PLT 275 01/29/2018   LYMPHOPCT 11 12/04/2018   BANDSPCT 0 05/21/2008   MONOPCT 6 12/04/2018   EOSPCT 1 12/04/2018   BASOPCT 1 12/04/2018    CMP:  Lab Results  Component Value Date   NA 140 12/08/2018   NA 142 01/29/2018   K 3.3 (L) 12/08/2018   CL 109 12/08/2018   CO2 24  12/08/2018   BUN 7 (L) 12/08/2018   BUN 29 (H) 01/29/2018   CREATININE 0.74 12/08/2018   CREATININE 1.24 (H) 09/30/2018   PROT 7.8 12/04/2018   PROT 6.5 01/29/2018   ALBUMIN 3.3 (L) 12/08/2018   ALBUMIN 4.0 01/29/2018   BILITOT 0.3 12/04/2018   BILITOT <0.2 01/29/2018   ALKPHOS 97 12/04/2018   AST 20 12/04/2018   ALT 23 12/04/2018  .   Total Discharge time is about 33 minutes  Roxan Hockey M.D on 12/08/2018 at 11:37 AM  Go to [email or web address] -  for contact info  [facility] - Office  [phone]

## 2018-12-08 NOTE — Plan of Care (Signed)
  Problem: Nutrition: Goal: Adequate nutrition will be maintained Outcome: Progressing   Problem: Coping: Goal: Level of anxiety will decrease Outcome: Progressing   Problem: Elimination: Goal: Will not experience complications related to bowel motility Outcome: Progressing   Problem: Pain Managment: Goal: General experience of comfort will improve Outcome: Progressing   

## 2018-12-09 ENCOUNTER — Telehealth: Payer: Self-pay | Admitting: *Deleted

## 2018-12-09 ENCOUNTER — Other Ambulatory Visit: Payer: Self-pay

## 2018-12-09 ENCOUNTER — Telehealth: Payer: Self-pay

## 2018-12-09 DIAGNOSIS — E1142 Type 2 diabetes mellitus with diabetic polyneuropathy: Secondary | ICD-10-CM | POA: Diagnosis not present

## 2018-12-09 DIAGNOSIS — J449 Chronic obstructive pulmonary disease, unspecified: Secondary | ICD-10-CM | POA: Diagnosis not present

## 2018-12-09 DIAGNOSIS — I110 Hypertensive heart disease with heart failure: Secondary | ICD-10-CM | POA: Diagnosis not present

## 2018-12-09 DIAGNOSIS — M5030 Other cervical disc degeneration, unspecified cervical region: Secondary | ICD-10-CM | POA: Diagnosis not present

## 2018-12-09 DIAGNOSIS — M7542 Impingement syndrome of left shoulder: Secondary | ICD-10-CM | POA: Diagnosis not present

## 2018-12-09 DIAGNOSIS — Z794 Long term (current) use of insulin: Secondary | ICD-10-CM | POA: Diagnosis not present

## 2018-12-09 DIAGNOSIS — D320 Benign neoplasm of cerebral meninges: Secondary | ICD-10-CM | POA: Diagnosis not present

## 2018-12-09 DIAGNOSIS — M159 Polyosteoarthritis, unspecified: Secondary | ICD-10-CM | POA: Diagnosis not present

## 2018-12-09 DIAGNOSIS — I69354 Hemiplegia and hemiparesis following cerebral infarction affecting left non-dominant side: Secondary | ICD-10-CM | POA: Diagnosis not present

## 2018-12-09 LAB — CULTURE, BLOOD (ROUTINE X 2)
Culture: NO GROWTH
Culture: NO GROWTH
Special Requests: ADEQUATE
Special Requests: ADEQUATE

## 2018-12-09 NOTE — Telephone Encounter (Signed)
Pt d/c from hospital 12-08-18 pt has appt 12-13-18 at 11:20 TOC call needed

## 2018-12-09 NOTE — Telephone Encounter (Signed)
[id number]  Attempted to contact patient to complete [facility] telephone call. No answer. Left message requesting call back. Will try again later. 1st attempt

## 2018-12-09 NOTE — Telephone Encounter (Signed)
[id number]  Attempted to contact patient to complete [facility] telephone call. No answer. Left message requesting call back. Will try again later. 2nd attempt

## 2018-12-10 ENCOUNTER — Telehealth: Payer: Self-pay | Admitting: *Deleted

## 2018-12-10 NOTE — Telephone Encounter (Signed)
Transition Care Management Follow-up Telephone Call   Date discharged? 12/08/2018              How have you been since you were released from the hospital? feeling pretty good   Do you understand why you were in the hospital? Small bowel blockage    Do you understand the discharge instructions? yes   Where were you discharged to? home   Items Reviewed:  Medications reviewed: yes  Allergies reviewed: yes  Dietary changes reviewed: yes  Referrals reviewed: no new referrals   Functional Questionnaire:   Activities of Daily Living (ADLs):  yes and has an aid    Any transportation issues/concerns?: yes   Any patient concerns? no   Confirmed importance and date/time of follow-up visits scheduled 12/13/2018 w/ Cherly Beach NP     Confirmed with patient if condition begins to worsen call PCP or go to the ER.  Patient was given the office number and encouraged to call back with question or concerns. Yes with verbal understanding

## 2018-12-10 NOTE — Telephone Encounter (Signed)
TOC completed and patient reminded of appt

## 2018-12-10 NOTE — Telephone Encounter (Signed)
An RN with [employer] called wanting [name] to reach out to pt as she was having some nausea since discharge pt has appt 12-13-18. Called pt she said she was having a lot of nausea so wanted to see if something could be called in to hold her over until her appt.

## 2018-12-11 ENCOUNTER — Other Ambulatory Visit: Payer: Self-pay

## 2018-12-11 ENCOUNTER — Other Ambulatory Visit: Payer: Self-pay | Admitting: *Deleted

## 2018-12-11 ENCOUNTER — Ambulatory Visit (INDEPENDENT_AMBULATORY_CARE_PROVIDER_SITE_OTHER): Payer: Medicare HMO | Admitting: Psychiatry

## 2018-12-11 ENCOUNTER — Encounter (HOSPITAL_COMMUNITY): Payer: Self-pay | Admitting: Psychiatry

## 2018-12-11 ENCOUNTER — Other Ambulatory Visit: Payer: Self-pay | Admitting: Family Medicine

## 2018-12-11 ENCOUNTER — Encounter: Payer: Self-pay | Admitting: *Deleted

## 2018-12-11 DIAGNOSIS — F331 Major depressive disorder, recurrent, moderate: Secondary | ICD-10-CM

## 2018-12-11 MED ORDER — ONDANSETRON HCL 4 MG PO TABS: 4.0000 mg | ORAL_TABLET | Freq: Three times a day (TID) | ORAL | 0 refills | Status: DC | PRN

## 2018-12-11 MED ORDER — RISPERIDONE 0.5 MG PO TABS: 0.5000 mg | ORAL_TABLET | Freq: Every day | ORAL | 2 refills | Status: DC

## 2018-12-11 MED ORDER — LAMOTRIGINE 100 MG PO TABS: 100.0000 mg | ORAL_TABLET | Freq: Two times a day (BID) | ORAL | 2 refills | Status: DC

## 2018-12-11 MED ORDER — SERTRALINE HCL 100 MG PO TABS: 100.0000 mg | ORAL_TABLET | Freq: Every day | ORAL | 2 refills | Status: DC

## 2018-12-11 MED ORDER — TRAZODONE HCL 100 MG PO TABS: 100.0000 mg | ORAL_TABLET | Freq: Every day | ORAL | 2 refills | Status: DC

## 2018-12-11 MED ORDER — LORAZEPAM 0.5 MG PO TABS: 0.5000 mg | ORAL_TABLET | Freq: Two times a day (BID) | ORAL | 2 refills | Status: DC

## 2018-12-11 NOTE — Telephone Encounter (Signed)
zofran sent to cA , pls let her know
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
## 2018-12-11 NOTE — Patient Outreach (Signed)
[facility]) Care Management  12/11/2018  [name] 1951/02/27 [id number]   Subjective: Telephone call to patient's home number, spoke with patient, and HIPAA verified.  Discussed [facility]ia[facility] referral follow up, patient voiced understanding, and is in agreement to follow up.  Patient states she is doing okay, had phone visit with Behavioral Health MD today, and appointment went well.  Patient states she will have a hospital follow up visit with primary MD on 12/13/2018 and planning to get flu shot at this appointment.   Transition of care follow up call completed by primary MD's office on 12/09/2018.  Patient states she is able to manage self care and has assistance as needed.   States she is currently receiving home care services (RN and aide) through [facility].  Patient voices understanding of medical diagnosis and treatment plan.   Patient states she is familiar with [facility] services, is in agreement to short term case management services with [facility] for  transition of care and COPD disease management.   Patient is also in agreement with referral to [facility] Social Worker for Advanced Directives document completion (patient states she already has the document, needs assistance with completion), follow up on Depression Screening tool result, and provide behavioral health counseling services/  resources.  Discussed Depression Screening results with patient, patient voices understanding, states she has a history of depression / anxiety/ bipolar disorder, currently receiving treatment (counseling and medications),  is in agreement to referral to [facility] Social Worker, and is in agreement to Education officer, museum discussing results with primary MD as needed. Patient states she is working on smoking cessation and is unable to have back or shoulder surgery until she has stopped smoking per Psychologist, sport and exercise. States she  continues to have chronic back and arm pain.  States she is accessing her [employer] benefits as needed via member services number on back of card.  Patient states she does not have any transportation  or pharmacy needs at this time.  States she is very appreciative of the follow up and is in agreement to receive [facility] services.     Objective: Per KPN (Knowledge Performance Now, point of care tool) and chart review, patient hospitalized 12/04/2018 - 12/08/2018 for SBO (small bowel obstruction).   Patient also has a history of diabetes, hypertension, Bipolar disorder, anxiety, Seizure disorder, COPD, tobacco user, cancer of gum, Carpal tunnel syndrome of right wrist, Chronic idiopathic constipation, Fibromyalgia, Hyperlipidemia,  IBS, Malignant hyperpyrexia, Metabolic encephalopathy,  Migraines, Narcolepsy, Osteoporosis, Pancreatitis, Schatzki's ring,  Sleep apnea (on CPAP), Stroke (left side weakness),  Tubular adenoma of colon, and chronic pain syndrome (Chronic neck and back pain).      Assessment:  Received [facility] referral on 12/09/2018.  Referral source: [facility].   Referral reason: Please refer to telephonic RN for hospital follow up. Patient had been engaged with a health coach prior to hospital admission.   Screening follow up completed.  Patient will be followed by [facility]) for short term case management  transition of care and COPD disease management.   Patient will be referred to [facility] [facility] Social Worker for  Advanced Directives document completion, follow up on Depression Screening tool result, and provide behavioral health counseling services/  resources.      Plan: RNCM will refer patient to [facility] [facility] Social Worker for  Advanced Directives document completion, follow  up on Depression Screening tool result, and provide behavioral health counseling services/  resources.  RNCM will send patient welcome packet,  [facility] pamphlet, and magnet. RNCM will transfer patient to [name] for transition of care follow up/ COPD disease management  and assigned RNCM will follow up with patient within 7 business days.    Jerzey Komperda H. Annia Friendly, BSN, [facility] Telephonic CM Phone: [phone] Fax: [phone]

## 2018-12-12 DIAGNOSIS — M159 Polyosteoarthritis, unspecified: Secondary | ICD-10-CM | POA: Diagnosis not present

## 2018-12-12 DIAGNOSIS — I10 Essential (primary) hypertension: Secondary | ICD-10-CM | POA: Diagnosis not present

## 2018-12-12 DIAGNOSIS — D320 Benign neoplasm of cerebral meninges: Secondary | ICD-10-CM | POA: Diagnosis not present

## 2018-12-12 DIAGNOSIS — M5030 Other cervical disc degeneration, unspecified cervical region: Secondary | ICD-10-CM | POA: Diagnosis not present

## 2018-12-12 DIAGNOSIS — K56699 Other intestinal obstruction unspecified as to partial versus complete obstruction: Secondary | ICD-10-CM | POA: Diagnosis not present

## 2018-12-12 DIAGNOSIS — E1142 Type 2 diabetes mellitus with diabetic polyneuropathy: Secondary | ICD-10-CM | POA: Diagnosis not present

## 2018-12-12 DIAGNOSIS — I69354 Hemiplegia and hemiparesis following cerebral infarction affecting left non-dominant side: Secondary | ICD-10-CM | POA: Diagnosis not present

## 2018-12-12 DIAGNOSIS — M7542 Impingement syndrome of left shoulder: Secondary | ICD-10-CM | POA: Diagnosis not present

## 2018-12-12 DIAGNOSIS — J449 Chronic obstructive pulmonary disease, unspecified: Secondary | ICD-10-CM | POA: Diagnosis not present

## 2018-12-12 DIAGNOSIS — R140 Abdominal distension (gaseous): Secondary | ICD-10-CM | POA: Diagnosis not present

## 2018-12-12 IMAGING — CT CT HEAD W/O CM
3 of 6 series · 14 of 47 positions shown, 17 images · non-contrast
Comparison: 04/10/2017

CLINICAL DATA: Altered level of consciousness.

EXAM:
CT HEAD WITHOUT CONTRAST
TECHNIQUE: Contiguous axial images were obtained from the base of the skull
through the vertex without intravenous contrast.

[Series 2: head wo · axial · 0.47mm/px · z∈[-15,+120]mm · 9 of 31 slices shown, 12 images]
[im 2/31  brain]
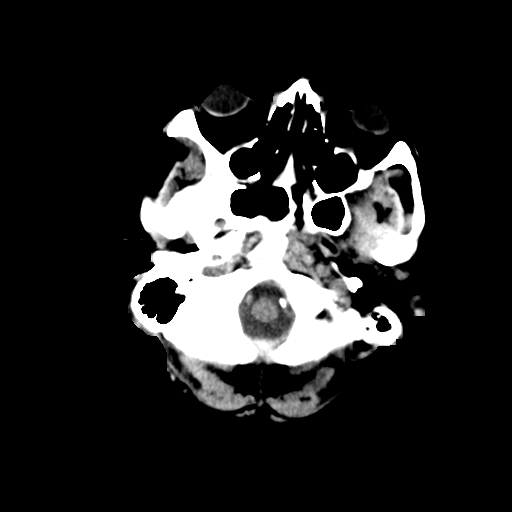
[im 2/31  bone]
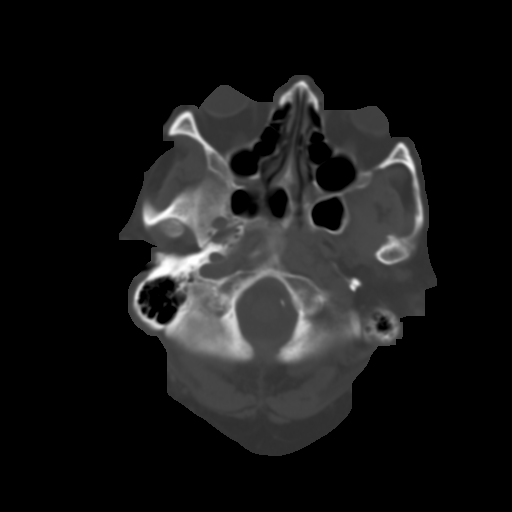
[im 6/31  brain]
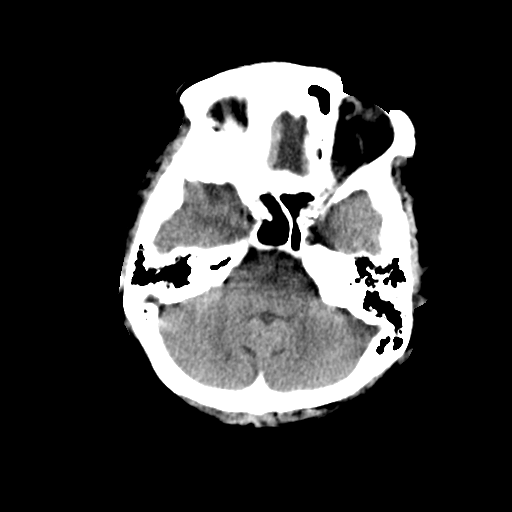
[im 9/31  brain]
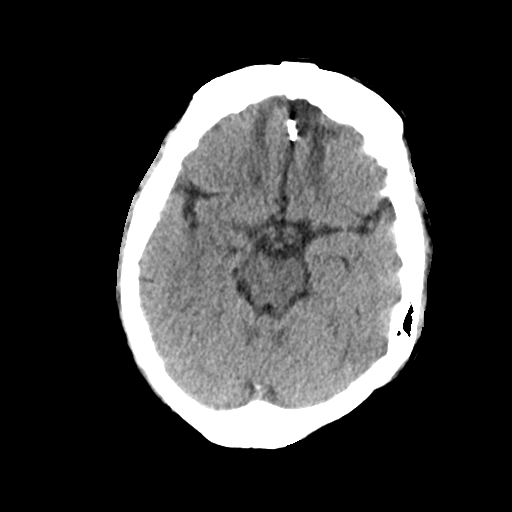
[im 13/31  brain]
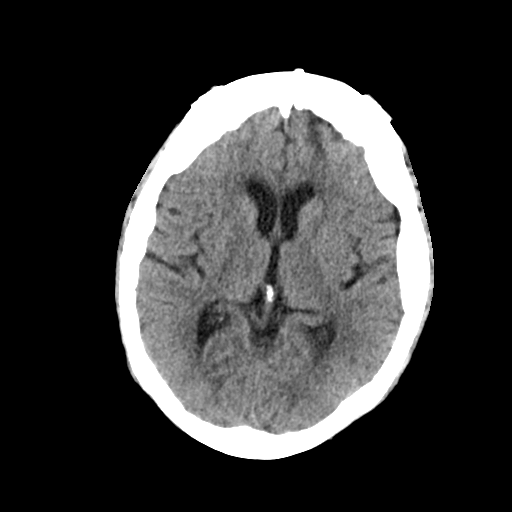
[im 16/31  brain]
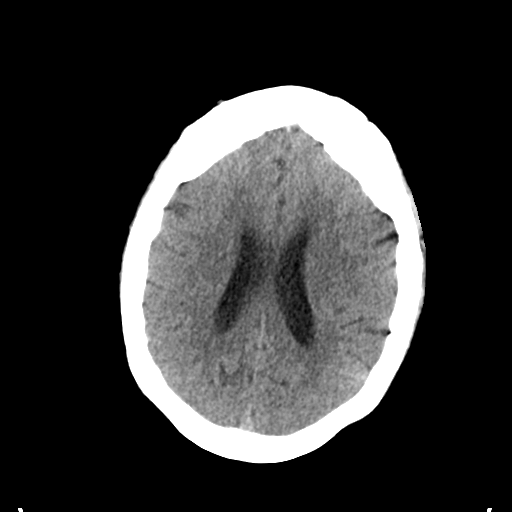
[im 16/31  bone]
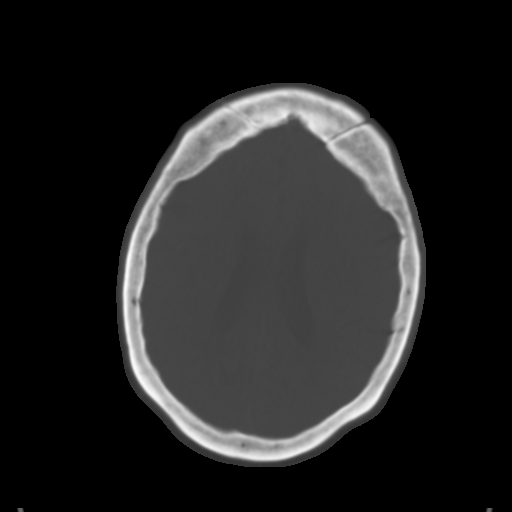
[im 18/31  brain]
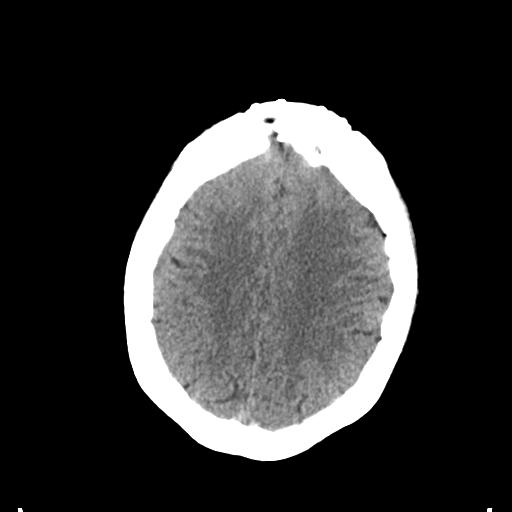
[im 22/31  brain]
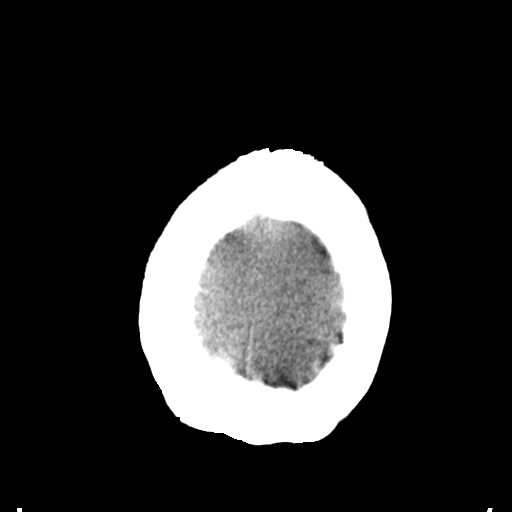
[im 25/31  brain]
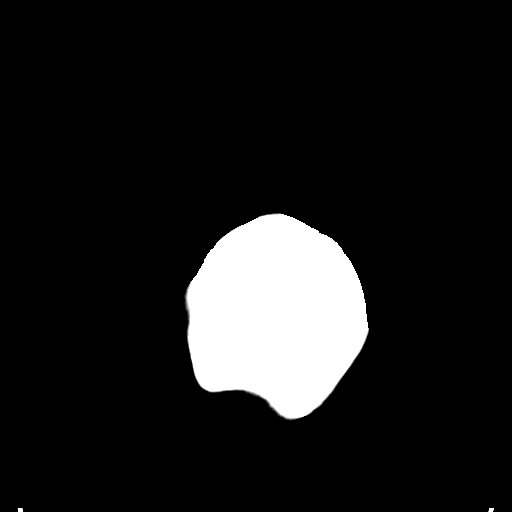
[im 29/31  brain]
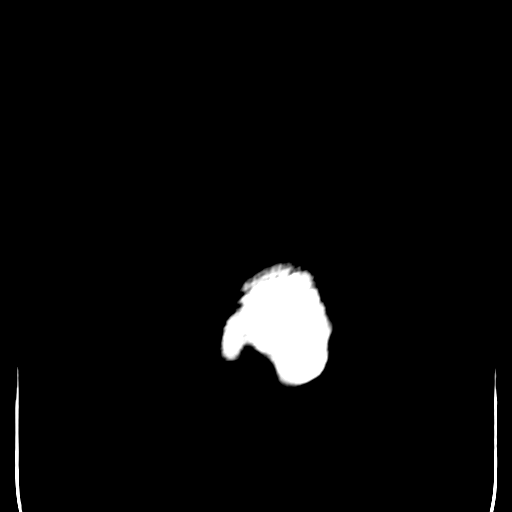
[im 29/31  bone]
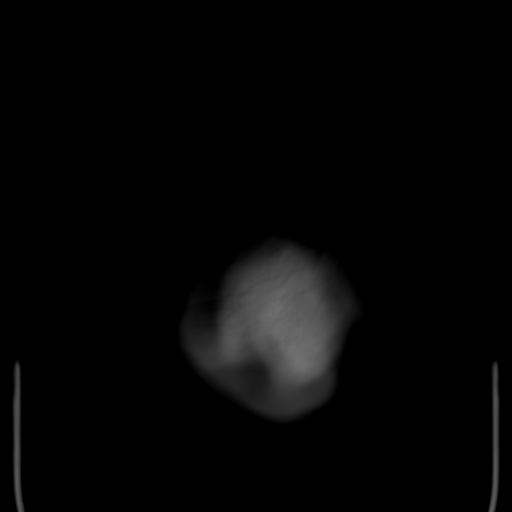

[Series 6: coronal soft tissue · coronal · 0.29mm/px · 3 of 67 slices shown]
[im 17/67  brain]
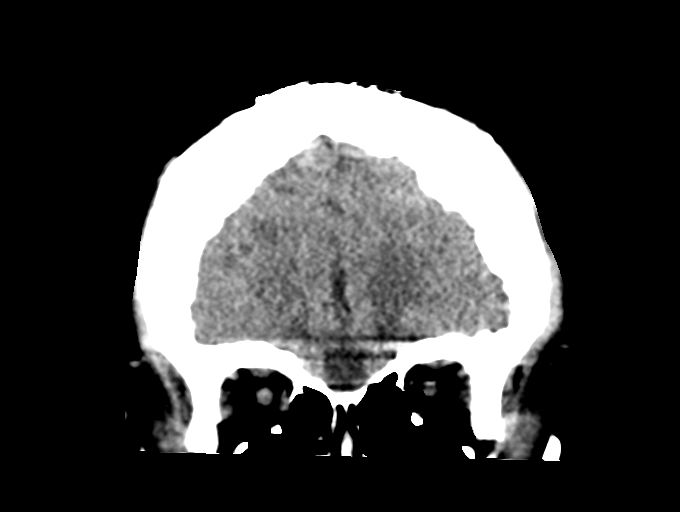
[im 34/67  brain]
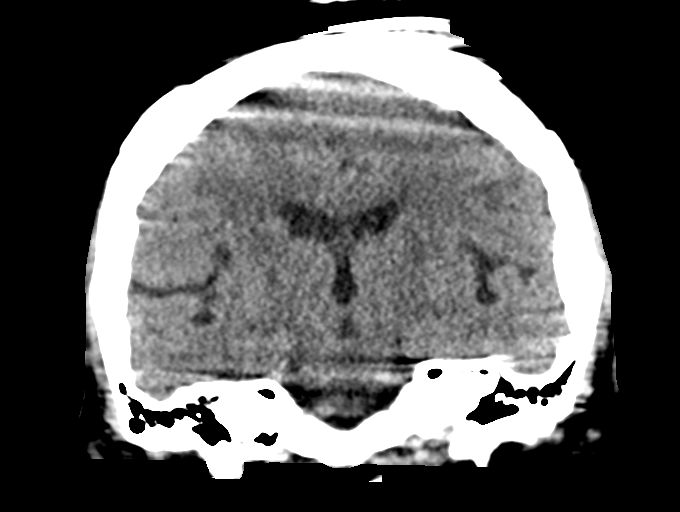
[im 50/67  brain]
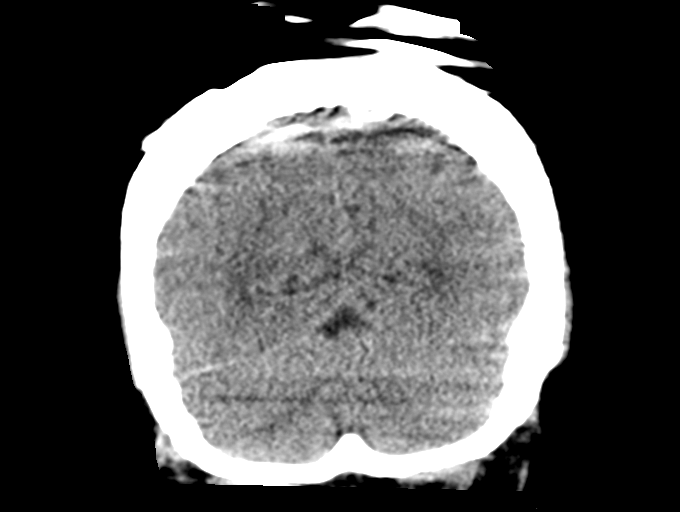

[Series 9: sagittal soft tissue · sagittal · 0.21mm/px · 2 of 67 slices shown]
[im 23/67  brain]
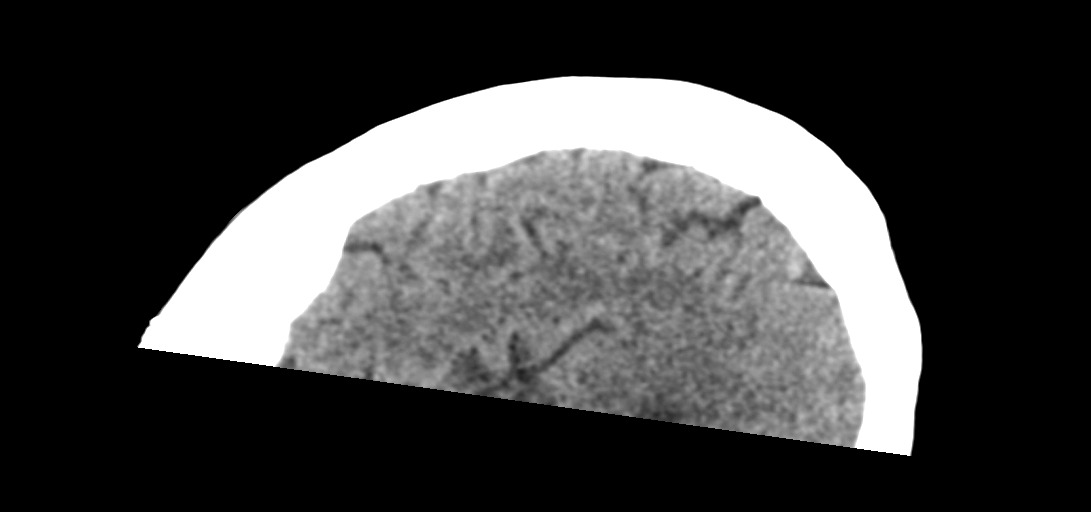
[im 45/67  brain]
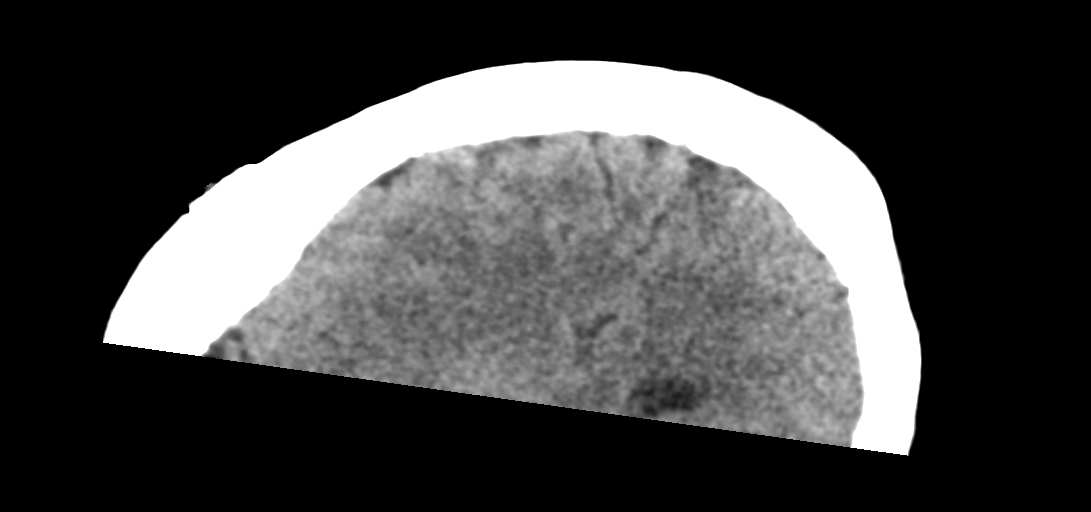

[14 of 47 positions shown; findings below may reference images not displayed]

FINDINGS: Brain: Old bifrontal encephalomalacia, stable. No acute infarction,
hemorrhage or hydrocephalus.

Vascular: No hyperdense vessel or unexpected calcification.

Skull: Prior frontal craniotomy.  No acute calvarial abnormality.

Sinuses/Orbits: Visualized paranasal sinuses and mastoids clear.
Orbital soft tissues unremarkable.

Other: None
IMPRESSION: Old bifrontal encephalomalacia due to craniotomy for meningioma
resection.

No acute intracranial abnormality.

## 2018-12-12 IMAGING — DX DG CHEST 2V
2 series · 2 of 2 positions shown · non-contrast
Comparison: November 12, 2016

CLINICAL DATA: Chest pain.  Pneumonia follow-up.

EXAM:
CHEST - 2 VIEW

[chest pa]
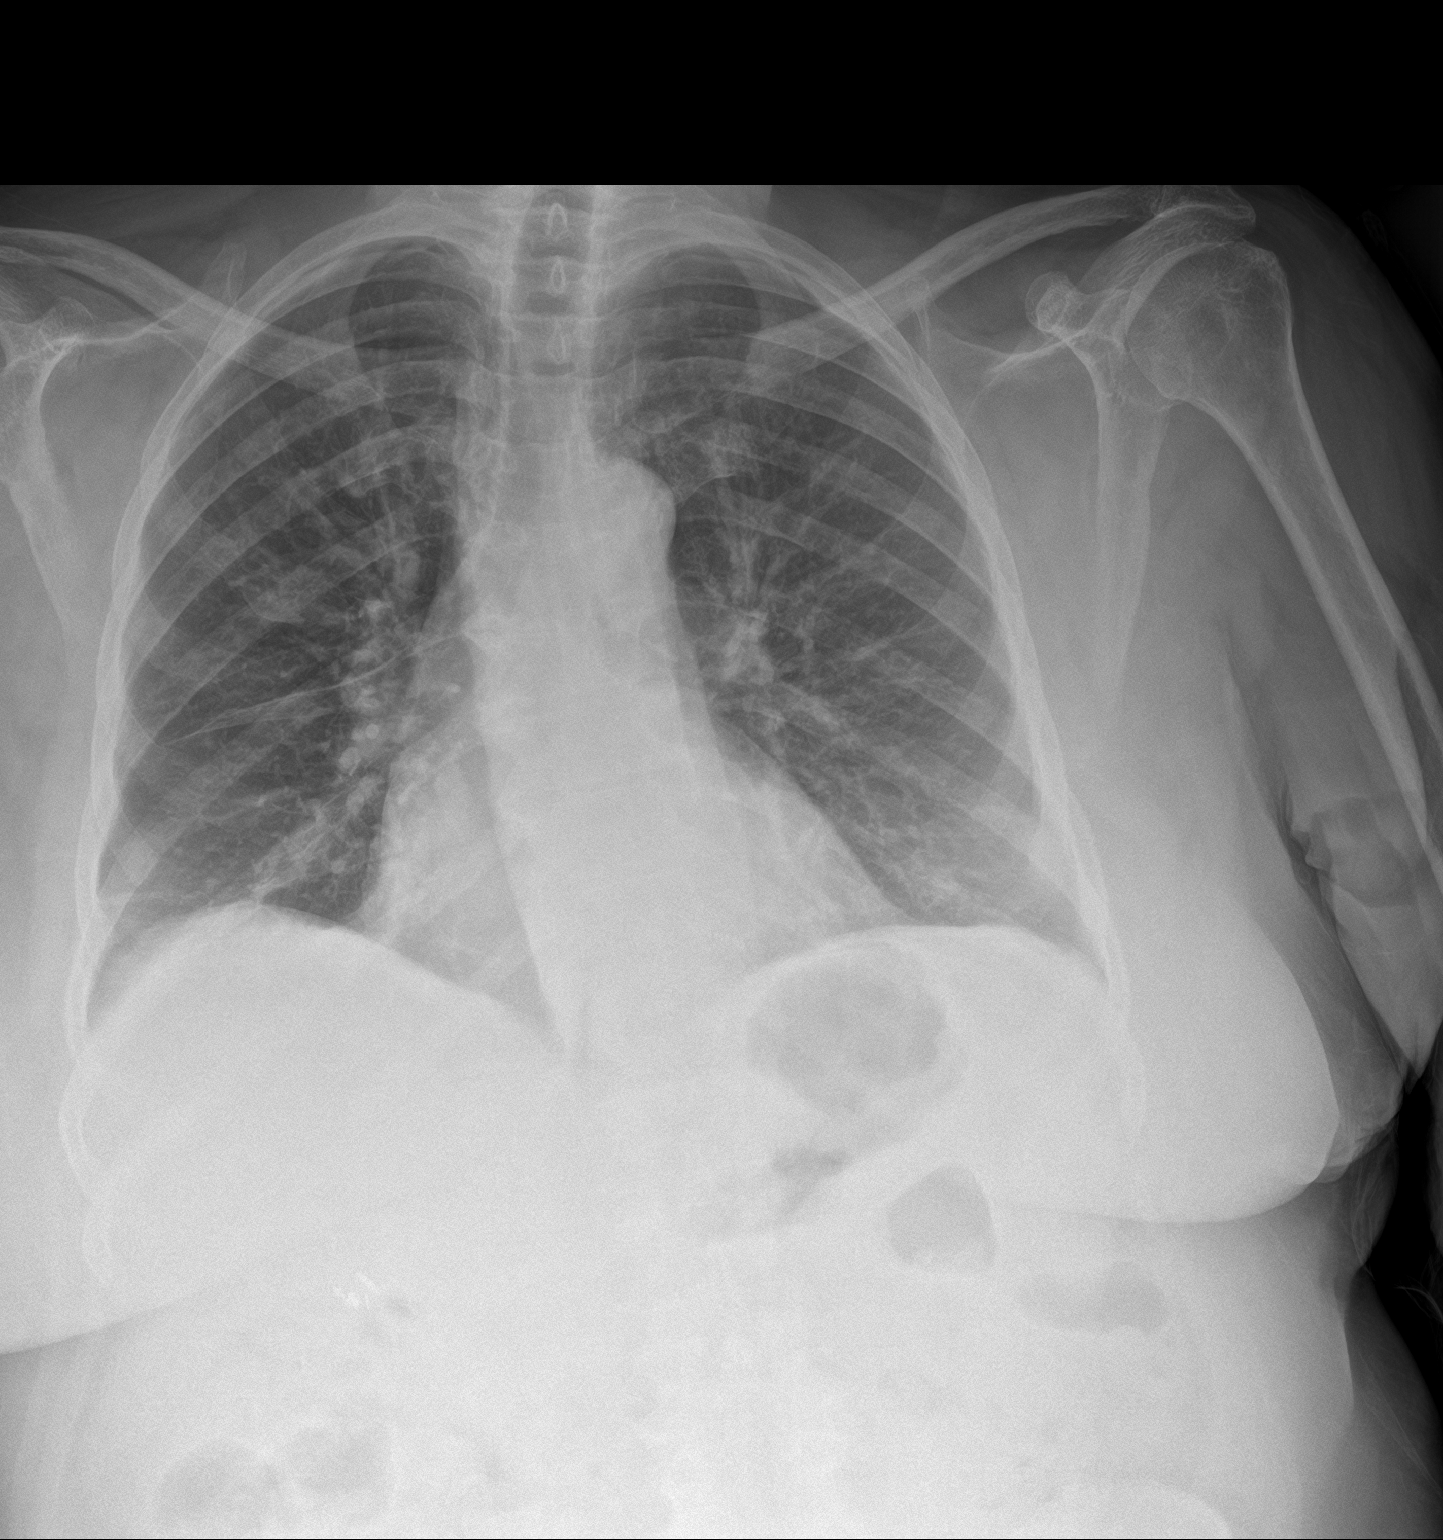

[chest lat]
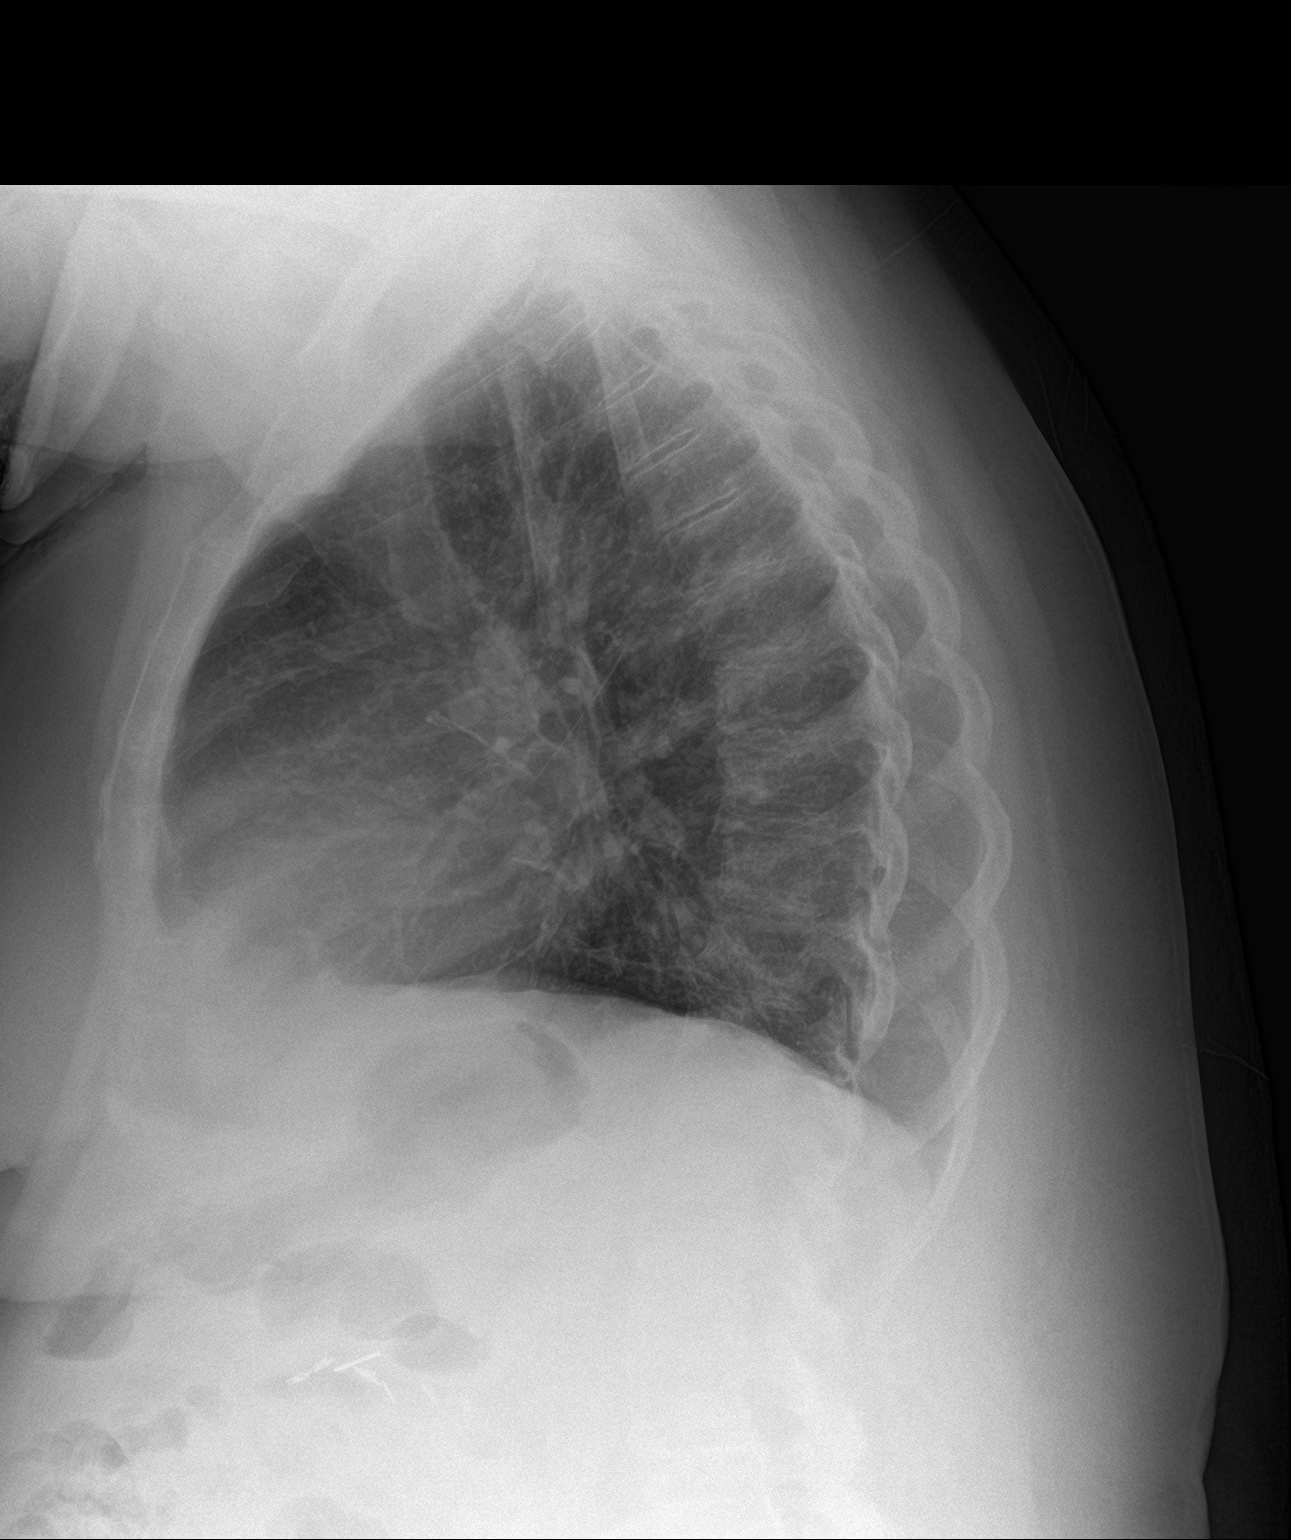

[2 of 2 positions shown; findings below may reference images not displayed]

FINDINGS: Coarsened lung markings suggesting bronchitic change. No focal
infiltrate. The heart, hila, mediastinum, lungs, and pleura are
otherwise normal.
IMPRESSION: Bronchitic changes.

## 2018-12-12 NOTE — Telephone Encounter (Signed)
Advised patient that medicine had been called in for her.

## 2018-12-13 ENCOUNTER — Other Ambulatory Visit: Payer: Self-pay | Admitting: Physician Assistant

## 2018-12-13 ENCOUNTER — Other Ambulatory Visit: Payer: Self-pay

## 2018-12-13 ENCOUNTER — Inpatient Hospital Stay (HOSPITAL_COMMUNITY): Payer: Medicare HMO | Attending: Hematology

## 2018-12-13 ENCOUNTER — Emergency Department (HOSPITAL_COMMUNITY)
Admission: EM | Admit: 2018-12-13 | Discharge: 2018-12-13 | Disposition: A | Discharge status: Home / Self Care | Payer: Medicare HMO | Attending: Emergency Medicine | Admitting: Emergency Medicine

## 2018-12-13 ENCOUNTER — Encounter (HOSPITAL_COMMUNITY): Payer: Self-pay | Admitting: Emergency Medicine

## 2018-12-13 ENCOUNTER — Emergency Department (HOSPITAL_COMMUNITY): Payer: Medicare HMO

## 2018-12-13 ENCOUNTER — Encounter: Payer: Self-pay | Admitting: Family Medicine

## 2018-12-13 ENCOUNTER — Ambulatory Visit (INDEPENDENT_AMBULATORY_CARE_PROVIDER_SITE_OTHER): Payer: Medicare HMO | Admitting: Family Medicine

## 2018-12-13 DIAGNOSIS — F17210 Nicotine dependence, cigarettes, uncomplicated: Secondary | ICD-10-CM | POA: Diagnosis not present

## 2018-12-13 DIAGNOSIS — E119 Type 2 diabetes mellitus without complications: Secondary | ICD-10-CM | POA: Diagnosis not present

## 2018-12-13 DIAGNOSIS — J449 Chronic obstructive pulmonary disease, unspecified: Secondary | ICD-10-CM | POA: Insufficient documentation

## 2018-12-13 DIAGNOSIS — D509 Iron deficiency anemia, unspecified: Secondary | ICD-10-CM

## 2018-12-13 DIAGNOSIS — M810 Age-related osteoporosis without current pathological fracture: Secondary | ICD-10-CM | POA: Insufficient documentation

## 2018-12-13 DIAGNOSIS — R140 Abdominal distension (gaseous): Secondary | ICD-10-CM

## 2018-12-13 DIAGNOSIS — R1030 Lower abdominal pain, unspecified: Secondary | ICD-10-CM

## 2018-12-13 DIAGNOSIS — I10 Essential (primary) hypertension: Secondary | ICD-10-CM | POA: Insufficient documentation

## 2018-12-13 DIAGNOSIS — Z79899 Other long term (current) drug therapy: Secondary | ICD-10-CM | POA: Diagnosis not present

## 2018-12-13 DIAGNOSIS — K5900 Constipation, unspecified: Secondary | ICD-10-CM | POA: Diagnosis not present

## 2018-12-13 DIAGNOSIS — Z8673 Personal history of transient ischemic attack (TIA), and cerebral infarction without residual deficits: Secondary | ICD-10-CM | POA: Insufficient documentation

## 2018-12-13 DIAGNOSIS — Z794 Long term (current) use of insulin: Secondary | ICD-10-CM | POA: Diagnosis not present

## 2018-12-13 DIAGNOSIS — R32 Unspecified urinary incontinence: Secondary | ICD-10-CM | POA: Diagnosis not present

## 2018-12-13 DIAGNOSIS — G4730 Sleep apnea, unspecified: Secondary | ICD-10-CM | POA: Insufficient documentation

## 2018-12-13 DIAGNOSIS — K56600 Partial intestinal obstruction, unspecified as to cause: Secondary | ICD-10-CM | POA: Diagnosis not present

## 2018-12-13 DIAGNOSIS — Z86010 Personal history of colonic polyps: Secondary | ICD-10-CM | POA: Insufficient documentation

## 2018-12-13 DIAGNOSIS — R1084 Generalized abdominal pain: Secondary | ICD-10-CM

## 2018-12-13 DIAGNOSIS — M797 Fibromyalgia: Secondary | ICD-10-CM | POA: Insufficient documentation

## 2018-12-13 DIAGNOSIS — M159 Polyosteoarthritis, unspecified: Secondary | ICD-10-CM | POA: Diagnosis not present

## 2018-12-13 DIAGNOSIS — F319 Bipolar disorder, unspecified: Secondary | ICD-10-CM | POA: Insufficient documentation

## 2018-12-13 DIAGNOSIS — Z7982 Long term (current) use of aspirin: Secondary | ICD-10-CM | POA: Insufficient documentation

## 2018-12-13 DIAGNOSIS — K219 Gastro-esophageal reflux disease without esophagitis: Secondary | ICD-10-CM | POA: Insufficient documentation

## 2018-12-13 DIAGNOSIS — R5383 Other fatigue: Secondary | ICD-10-CM | POA: Insufficient documentation

## 2018-12-13 DIAGNOSIS — R112 Nausea with vomiting, unspecified: Secondary | ICD-10-CM

## 2018-12-13 DIAGNOSIS — M5030 Other cervical disc degeneration, unspecified cervical region: Secondary | ICD-10-CM | POA: Diagnosis not present

## 2018-12-13 DIAGNOSIS — I509 Heart failure, unspecified: Secondary | ICD-10-CM

## 2018-12-13 DIAGNOSIS — I69354 Hemiplegia and hemiparesis following cerebral infarction affecting left non-dominant side: Secondary | ICD-10-CM | POA: Diagnosis not present

## 2018-12-13 DIAGNOSIS — E039 Hypothyroidism, unspecified: Secondary | ICD-10-CM | POA: Diagnosis not present

## 2018-12-13 DIAGNOSIS — E1142 Type 2 diabetes mellitus with diabetic polyneuropathy: Secondary | ICD-10-CM | POA: Diagnosis not present

## 2018-12-13 DIAGNOSIS — I110 Hypertensive heart disease with heart failure: Secondary | ICD-10-CM

## 2018-12-13 DIAGNOSIS — K56609 Unspecified intestinal obstruction, unspecified as to partial versus complete obstruction: Secondary | ICD-10-CM

## 2018-12-13 DIAGNOSIS — M7542 Impingement syndrome of left shoulder: Secondary | ICD-10-CM | POA: Diagnosis not present

## 2018-12-13 DIAGNOSIS — R110 Nausea: Secondary | ICD-10-CM | POA: Insufficient documentation

## 2018-12-13 DIAGNOSIS — D320 Benign neoplasm of cerebral meninges: Secondary | ICD-10-CM | POA: Diagnosis not present

## 2018-12-13 DIAGNOSIS — E785 Hyperlipidemia, unspecified: Secondary | ICD-10-CM | POA: Insufficient documentation

## 2018-12-13 DIAGNOSIS — F250 Schizoaffective disorder, bipolar type: Secondary | ICD-10-CM

## 2018-12-13 LAB — CBC WITH DIFFERENTIAL/PLATELET
Abs Immature Granulocytes: 0.04 10*3/uL (ref 0.00–0.07)
Abs Immature Granulocytes: 0.04 10*3/uL (ref 0.00–0.07)
Basophils Absolute: 0 10*3/uL (ref 0.0–0.1)
Basophils Absolute: 0 10*3/uL (ref 0.0–0.1)
Basophils Relative: 0 %
Basophils Relative: 0 %
Eosinophils Absolute: 0.2 10*3/uL (ref 0.0–0.5)
Eosinophils Absolute: 0.2 10*3/uL (ref 0.0–0.5)
Eosinophils Relative: 4 %
Eosinophils Relative: 4 %
HCT: 31.6 % — ABNORMAL LOW (ref 36.0–46.0)
HCT: 31.1 % — ABNORMAL LOW (ref 36.0–46.0)
Hemoglobin: 8.9 g/dL — ABNORMAL LOW (ref 12.0–15.0)
Hemoglobin: 8.7 g/dL — ABNORMAL LOW (ref 12.0–15.0)
Immature Granulocytes: 1 %
Immature Granulocytes: 1 %
Lymphocytes Relative: 27 %
Lymphocytes Relative: 25 %
Lymphs Abs: 1.4 10*3/uL (ref 0.7–4.0)
Lymphs Abs: 1.3 10*3/uL (ref 0.7–4.0)
MCH: 22.5 pg — ABNORMAL LOW (ref 26.0–34.0)
MCH: 22.4 pg — ABNORMAL LOW (ref 26.0–34.0)
MCHC: 28.2 g/dL — ABNORMAL LOW (ref 30.0–36.0)
MCHC: 28 g/dL — ABNORMAL LOW (ref 30.0–36.0)
MCV: 79.8 fL — ABNORMAL LOW (ref 80.0–100.0)
MCV: 79.9 fL — ABNORMAL LOW (ref 80.0–100.0)
Monocytes Absolute: 0.5 10*3/uL (ref 0.1–1.0)
Monocytes Absolute: 0.5 10*3/uL (ref 0.1–1.0)
Monocytes Relative: 9 %
Monocytes Relative: 10 %
Neutro Abs: 3.3 10*3/uL (ref 1.7–7.7)
Neutro Abs: 3.2 10*3/uL (ref 1.7–7.7)
Neutrophils Relative %: 59 %
Neutrophils Relative %: 60 %
Platelets: 201 10*3/uL (ref 150–400)
Platelets: 216 10*3/uL (ref 150–400)
RBC: 3.96 MIL/uL (ref 3.87–5.11)
RBC: 3.89 MIL/uL (ref 3.87–5.11)
RDW: 15.2 % (ref 11.5–15.5)
RDW: 15.5 % (ref 11.5–15.5)
WBC: 5.4 10*3/uL (ref 4.0–10.5)
WBC: 5.3 10*3/uL (ref 4.0–10.5)
nRBC: 0 % (ref 0.0–0.2)
nRBC: 0 % (ref 0.0–0.2)

## 2018-12-13 LAB — COMPREHENSIVE METABOLIC PANEL
ALT: 26 U/L (ref 0–44)
ALT: 23 U/L (ref 0–44)
AST: 24 U/L (ref 15–41)
AST: 23 U/L (ref 15–41)
Albumin: 3.7 g/dL (ref 3.5–5.0)
Albumin: 3.5 g/dL (ref 3.5–5.0)
Alkaline Phosphatase: 84 U/L (ref 38–126)
Alkaline Phosphatase: 80 U/L (ref 38–126)
Anion gap: 9 (ref 5–15)
Anion gap: 8 (ref 5–15)
BUN: 17 mg/dL (ref 8–23)
BUN: 17 mg/dL (ref 8–23)
CO2: 24 mmol/L (ref 22–32)
CO2: 24 mmol/L (ref 22–32)
Calcium: 8.7 mg/dL — ABNORMAL LOW (ref 8.9–10.3)
Calcium: 8.5 mg/dL — ABNORMAL LOW (ref 8.9–10.3)
Chloride: 111 mmol/L (ref 98–111)
Chloride: 110 mmol/L (ref 98–111)
Creatinine, Ser: 0.96 mg/dL (ref 0.44–1.00)
Creatinine, Ser: 0.97 mg/dL (ref 0.44–1.00)
GFR calc Af Amer: >60 mL/min (ref >60)
GFR calc Af Amer: >60 mL/min (ref >60)
GFR calc non Af Amer: >60 mL/min (ref >60)
GFR calc non Af Amer: >60 mL/min (ref >60)
Glucose, Bld: 172 mg/dL — ABNORMAL HIGH (ref 70–99)
Glucose, Bld: 174 mg/dL — ABNORMAL HIGH (ref 70–99)
Potassium: 3.3 mmol/L — ABNORMAL LOW (ref 3.5–5.1)
Potassium: 3.3 mmol/L — ABNORMAL LOW (ref 3.5–5.1)
Sodium: 144 mmol/L (ref 135–145)
Sodium: 142 mmol/L (ref 135–145)
Total Bilirubin: 0.2 mg/dL — ABNORMAL LOW (ref 0.3–1.2)
Total Bilirubin: 0.3 mg/dL (ref 0.3–1.2)
Total Protein: 6.7 g/dL (ref 6.5–8.1)
Total Protein: 6.4 g/dL — ABNORMAL LOW (ref 6.5–8.1)

## 2018-12-13 LAB — LIPASE, BLOOD: Lipase: 24 U/L (ref 11–51)

## 2018-12-13 LAB — IRON AND TIBC
Iron: 32 ug/dL (ref 28–170)
Saturation Ratios: 9 % — ABNORMAL LOW (ref 10.4–31.8)
TIBC: 343 ug/dL (ref 250–450)
UIBC: 311 ug/dL

## 2018-12-13 LAB — FERRITIN: Ferritin: 68 ng/mL (ref 11–307)

## 2018-12-13 LAB — POC OCCULT BLOOD, ED: Fecal Occult Bld: NEGATIVE

## 2018-12-13 MED ORDER — SODIUM CHLORIDE 0.9 % IV BOLUS
500.0000 mL | Freq: Once | INTRAVENOUS | Status: AC
  Administered 2018-12-13: 15:00:00 500 mL via INTRAVENOUS

## 2018-12-13 MED ORDER — FENTANYL CITRATE (PF) 100 MCG/2ML IJ SOLN
50.0000 ug | Freq: Once | INTRAMUSCULAR | Status: AC
  Administered 2018-12-13: 15:00:00 50 ug via INTRAVENOUS
  Filled 2018-12-13: qty 2

## 2018-12-13 MED ORDER — ONDANSETRON HCL 4 MG/2ML IJ SOLN
4.0000 mg | Freq: Once | INTRAMUSCULAR | Status: AC
  Administered 2018-12-13: 15:00:00 4 mg via INTRAVENOUS
  Filled 2018-12-13: qty 2

## 2018-12-13 MED ORDER — GOLYTELY 236 G PO SOLR: 4000.0000 mL | Freq: Once | ORAL | 0 refills | Status: AC

## 2018-12-13 MED ORDER — IOHEXOL 300 MG/ML  SOLN
100.0000 mL | Freq: Once | INTRAMUSCULAR | Status: AC | PRN
  Administered 2018-12-13: 100 mL via INTRAVENOUS

## 2018-12-13 NOTE — ED Provider Notes (Signed)
[facility] EMERGENCY DEPARTMENT Provider Note   CSN: [id number] Arrival date & time: 12/13/18  1259     History   Chief Complaint Chief Complaint  Patient presents with   Constipation    HPI [name] is a 68 y.o. female.  She was just discharged about 5 days ago after being admitted for constipation and bowel obstruction.  Since discharge she is complaining of increased abdominal distention pain continued nausea and not being able to move her bowels despite taking laxatives.  No fevers or chills.  She rates her pain as 5 out of 10 crampy in nature diffuse.  She says she is passing gas but no real stool.     The history is provided by the patient.  Abdominal Pain Pain location:  Generalized Pain quality: cramping   Pain radiates to:  Does not radiate Pain severity:  Moderate Onset quality:  Gradual Timing:  Intermittent Progression:  Unchanged Chronicity:  Recurrent Context: not trauma   Relieved by:  Nothing Worsened by:  Nothing Ineffective treatments: laxatives. Associated symptoms: constipation and nausea   Associated symptoms: no chest pain, no cough, no diarrhea, no dysuria, no fever, no hematuria, no shortness of breath, no sore throat and no vomiting     Past Medical History:  Diagnosis Date   Allergy    Anemia    Anxiety    takes Ativan daily   Arthritis    Assistance needed for mobility    Bipolar disorder ([place])    takes Risperdal nightly   Blood transfusion    Cancer ([place])    In her gum   Carpal tunnel syndrome of right wrist 05/23/2011   Cervical disc disorder with radiculopathy of cervical region 10/31/2012   Chronic back pain    Chronic idiopathic constipation    Chronic neck and back pain    Colon polyps    COPD (chronic obstructive pulmonary disease) with chronic bronchitis ([place]) 09/16/2013   Office Spirometry 10/30/2013-submaximal effort based on appearance of loop and curve. Numbers would fit with severe restriction but her  physiologic capability may be better than this. FVC 0.91/44%, and 10.74/45%, FEV1/FVC 0.81, FEF 25-75% 1.43/69%     Depression    takes Zoloft daily   Diabetes mellitus    Type II   Diverticulosis    TCS 9/08 by Dr. Delfin Edis for diarrhea . Bx for micro scopic colitis negative.    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   Metabolic encephalopathy [id number]   Migraines    chronic headaches   Mononeuritis lower limb    Narcolepsy    Osteoporosis    Pancreatitis 2006   due to Depakote with normal EUS    Schatzki's ring    non critical / EGD with ED 8/2011with RMR   Seizures ([place])    takes Lamictal daily.Last seizure 3 yrs ago   Sleep apnea    on CPAP   Stroke ([place])    left sided weakness, speech  changes   Tubular adenoma of colon     Patient Active Problem List   Diagnosis Date Noted   SBO (small bowel obstruction) ([place]) 12/04/2018   Benign neoplasm of cerebral meninges ([place]) 11/12/2018   Benign meningioma of brain ([place]) 10/31/2018   Osteoarthritis of both hips 07/06/2018   Left arm pain 06/11/2018   Lipoma of extremity 12/31/2017   Impingement syndrome of left shoulder region 12/27/2017   Leg weakness, bilateral 10/28/2017   Lumbar spondylosis with myelopathy 10/12/2017   Numbness of hand 10/10/2017   Chronic pain syndrome 07/25/2017   At risk for cardiovascular event 06/10/2017   Rash 04/25/2017   Diabetic polyneuropathy associated with type 2 diabetes mellitus ([place]) 05/19/2016     Tobacco user 04/24/2016   History of palpitations 08/09/2015   Nausea 08/09/2015   Labile hypertension 08/03/2015   Normal coronary arteries 08/03/2015   Left-sided low back pain with left-sided sciatica 06/27/2015   Type 2 diabetes mellitus with hyperglycemia, with long-term current use of insulin ([place]) 05/06/2015   Multinodular goiter 05/06/2015   Rectocele, female 04/27/2015   Anal sphincter incontinence 04/27/2015   Pelvic relaxation due to rectocele 03/30/2015   Pulmonary hypertension ([place]) 02/22/2015   Light cigarette smoker (1-9 cigarettes per day) 01/11/2015   Migraine without aura and without status migrainosus, not intractable 07/02/2014   Flatulence 02/18/2014   Microcytic anemia 02/18/2014   COPD mixed type ([place]) 09/16/2013   Hypothyroidism 08/16/2013   Gastroparesis 04/28/2013   Seizure disorder ([place]) 01/19/2013   Cervical disc disorder with radiculopathy of cervical region 10/31/2012   Solitary pulmonary nodule 08/19/2012   Anemia 07/05/2012   Hypersomnia disorder related to a known organic factor 06/11/2012   Meningioma ([place]) 11/19/2011   Mononeuritis leg 10/25/2011   Carpal tunnel syndrome of right wrist 05/23/2011   Polypharmacy 04/28/2011   Bipolar disorder ([place]) 04/28/2011   Constipation 04/13/2011   Falls frequently 12/12/2010   Oropharyngeal dysphagia 07/12/2010   Urinary incontinence 12/16/2009   HEARING LOSS 10/26/2009   Hyperlipidemia 12/11/2008   IBS 12/11/2008   GERD 07/29/2008   MILK PRODUCTS ALLERGY 07/29/2008   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/03/2007   Essential hypertension 06/27/2007   Backache 06/19/2007   Osteoporosis 06/19/2007   Obstructive sleep apnea 06/19/2007   TRIGGER FINGER 04/18/2007   DIVERTICULOSIS, COLON 11/13/2006    Past Surgical History:  Procedure Laterality Date   ABDOMINAL HYSTERECTOMY  1978   BACK SURGERY  July 2012   BACTERIAL  OVERGROWTH TEST N/A 05/05/2013   Procedure: BACTERIAL OVERGROWTH TEST;  Surgeon: Daneil Dolin, MD;  [facility]: [facility];  Service: Endoscopy;  Laterality: N/A;  7:30   BIOPSY THYROID  2009   BRAIN SURGERY  11/2011   resection of meningioma   BREAST REDUCTION SURGERY  1994   CARDIAC CATHETERIZATION  05/10/2005   normal coronaries, normal LV systolic function and EF (Dr. Jackie Plum)   [place] Left 07/22/04   Dr. Aline Brochure   CATARACT EXTRACTION Bilateral    CHOLECYSTECTOMY  1984   COLONOSCOPY N/A 09/25/2012   [id number] diverticulosis.  colonic polyp-removed : tubular adenoma   CRANIOTOMY  11/23/2011   Procedure: CRANIOTOMY TUMOR EXCISION;  Surgeon: Hosie Spangle, MD;  Location: [place]URO ORS;  Service: Neurosurgery;  Laterality: N/A;  Craniotomy for tumor resection   ESOPHAGOGASTRODUODENOSCOPY  12/29/2010   [place]-Retained food in the esophagus and stomach, small hiatal hernia, status post Maloney dilation of the esophagus   ESOPHAGOGASTRODUODENOSCOPY N/A 09/25/2012  [id number] atonic baggy esophagus status post Maloney dilation 8 F. Hiatal hernia   GIVENS CAPSULE STUDY N/A 01/15/2013   NORMAL.    IR GENERIC HISTORICAL  03/17/2016   IR RADIOLOGIST EVAL & MGMT 03/17/2016 [facility]-INTERV RAD   LESION REMOVAL N/A 05/31/2015   Procedure: REMOVAL RIGHT AND LEFT LESIONS OF MANDIBLE;  Surgeon: Diona Browner, DDS;  Location: [place];  Service: Oral Surgery;  Laterality: N/A;   MALONEY DILATION  12/29/2010   RMR;   NM MYOCAR PERF WALL MOTION  2006   "relavtiely normal" persantine, mild anterior thinning (breast attenuation artifact), no region of scar/ischemia   OVARIAN CYST REMOVAL     RECTOCELE REPAIR N/A 06/29/2015   Procedure: POSTERIOR REPAIR (RECTOCELE);  Surgeon: Jonnie Kind, MD;  Location: [facility] OR[facility];  Service: Gynecology;  Laterality: N/A;   REDUCTION MAMMAPLASTY Bilateral    SPINE SURGERY  09/29/2010   Dr. Rolena Infante   surgical excision of 3 tumors from right  thigh and right buttock  and left upper thigh  2010   TOOTH EXTRACTION Bilateral 12/14/2014   Procedure: REMOVAL OF BILATERAL MANDIBULAR EXOSTOSES;  Surgeon: Diona Browner, DDS;  Location: [place];  Service: Oral Surgery;  Laterality: Bilateral;   TRANSTHORACIC ECHOCARDIOGRAM  2010   EF 60-65%, mild conc LVH, grade 1 diastolic dysfunction; mildly calcified MV annulus with mildly thickened leaflets, mildly calcified MR annulus     OB History    Gravida  6   Para  1   Term  1   Preterm      AB  5   Living        SAB  5   TAB      Ectopic      Multiple      Live Births               Home Medications    Prior to Admission medications   Medication Sig Start Date End Date Taking? Authorizing Provider  ACCU-CHEK AVIVA PLUS test strip CHECK BLOOD SUGAR FOUR TIMES DAILY 12/09/18   Philemon Kingdom, MD  acetaminophen (TYLENOL) 325 MG tablet Take 2 tablets (650 mg total) by mouth every 6 (six) hours as needed for mild pain or headache (or Fever >/= 101). 12/08/18   Roxan Hockey, MD  albuterol (VENTOLIN HFA) 108 (90 Base) MCG/ACT inhaler Inhale 1-2 puffs every 6 hours as needed for wheezing, shortness of breath 07/05/18   Baird Lyons D, MD  amLODipine (NORVASC) 10 MG tablet Take 1 tablet (10 mg total) by mouth daily. 12/08/18 03/08/19  Roxan Hockey, MD  ascorbic acid (VITAMIN C) 500 MG tablet Take 500 mg by mouth daily.    [provider]  aspirin EC 81 MG tablet Take 1 tablet (81 mg total) by mouth daily with breakfast. 12/08/18   Denton Brick, Courage, MD  bisacodyl (DULCOLAX) 10 MG suppository Place 1 suppository (10 mg total) rectally daily as needed for mild constipation or moderate constipation. 12/08/18   Roxan Hockey, MD  cetirizine (ZYRTEC) 10 MG tablet Take 1 tablet (10 mg total) by mouth daily. 07/30/17   [name], MD  Cholecalciferol (VITAMIN D3) 5000 units CAPS Take 1 capsule by mouth daily.    [provider]  clobetasol cream  (TEMOVATE) [id number] % Apply 1 application topically 2 (two) times daily.  07/30/18   [provider]  cloNIDine (CATAPRES) 0.2 MG tablet Take 1 tablet by mouth daily. 12/05/18   [provider]  diclofenac sodium (VOLTAREN) 1 % GEL  Apply 1 application topically 2 (two) times daily as needed. 12/05/18   [provider]  insulin aspart (NOVOLOG FLEXPEN) 100 UNIT/ML FlexPen INJECT 10 TO 14 UNITS INTO THE SKIN 3 (THREE) TIMES DAILY WITH MEALS. 06/07/17   Philemon Kingdom, MD  Insulin Glargine (TOUJEO SOLOSTAR) 300 UNIT/ML SOPN Inject 20 Units into the skin at bedtime. 06/07/17   Philemon Kingdom, MD  lamoTRIgine (LAMICTAL) 100 MG tablet Take 1 tablet (100 mg total) by mouth 2 (two) times daily. 12/11/18   Cloria Spring, MD  levothyroxine (SYNTHROID) 50 MCG tablet Take 1 tablet (50 mcg total) by mouth daily before breakfast. 12/08/18   Emokpae, Courage, MD  LORazepam (ATIVAN) 0.5 MG tablet Take 1 tablet (0.5 mg total) by mouth 2 (two) times daily. 12/11/18 12/11/19  Cloria Spring, MD  losartan (COZAAR) 50 MG tablet TAKE 1 TABLET EVERY DAY 09/19/18   [name], MD  metaxalone (SKELAXIN) 800 MG tablet metaxalone 800 mg tablet  take 1 tablet by mouth 3 times daily as needed.    [provider]  metoprolol tartrate (LOPRESSOR) 50 MG tablet Take 1 tablet (50 mg total) by mouth 2 (two) times daily. 12/08/18   Roxan Hockey, MD  montelukast (SINGULAIR) 10 MG tablet TAKE 1 TABLET EVERY DAY 09/19/18   [name], MD  Multiple Vitamins-Minerals (HM MULTIVITAMIN ADULT GUMMY PO) Take 1 tablet by mouth daily.    [provider]  nystatin (MYCOSTATIN/NYSTOP) powder Apply to affected areas four times daily as needed. 09/16/18   [name], MD  ondansetron (ZOFRAN) 4 MG tablet Take 1 tablet (4 mg total) by mouth every 8 (eight) hours as needed for nausea or vomiting. 12/11/18   [name], MD  oxyCODONE-acetaminophen (PERCOCET) 10-325 MG tablet   08/06/18   [provider]  PAZEO 0.7 % SOLN  12/09/18   [provider]  pregabalin (LYRICA) 75 MG capsule Take 1 capsule (75 mg total) by mouth daily. 10/20/16   Rexene Alberts, MD  RABEprazole (ACIPHEX) 20 MG tablet TAKE 1 TABLET TWICE DAILY 11/04/18   Esterwood, Amy S, PA-C  RESTASIS 0.05 % ophthalmic emulsion Place 1 drop into both eyes 2 (two) times daily.  02/04/14   [provider]  risperiDONE (RISPERDAL) 0.5 MG tablet Take 1 tablet (0.5 mg total) by mouth at bedtime. 12/11/18   Cloria Spring, MD  rosuvastatin (CRESTOR) 5 MG tablet Take 1 tablet (5 mg total) by mouth at bedtime. 12/08/18   Roxan Hockey, MD  sertraline (ZOLOFT) 100 MG tablet Take 1 tablet (100 mg total) by mouth daily. 12/11/18   Cloria Spring, MD  Tiotropium Bromide-Olodaterol (STIOLTO RESPIMAT) 2.5-2.5 MCG/ACT AERS Inhale 2 puffs into the lungs daily. 07/05/18   Deneise Lever, MD  traZODone (DESYREL) 100 MG tablet Take 1 tablet (100 mg total) by mouth at bedtime. 12/11/18   Cloria Spring, MD  triamcinolone ointment (KENALOG) 0.1 % Apply 1 application topically at bedtime. Apply to elbows at bedtime.    [provider]    Family History Family History  Problem Relation Age of Onset   Heart attack Mother        HTN   Pneumonia Father    Kidney failure Father    Diabetes Father    Pancreatic cancer Sister    Diabetes Brother    Hypertension Brother    Diabetes Brother    Cancer Sister        breast    Hypertension  Son    Sleep apnea Son    Cancer Sister        pancreatic   Stroke Maternal Grandmother    Heart attack Maternal Grandfather    Alcohol abuse Maternal Uncle    Colon cancer Neg Hx    Anesthesia problems Neg Hx    Hypotension Neg Hx    Malignant hyperthermia Neg Hx    Pseudochol deficiency Neg Hx    Breast cancer Neg Hx     Social History Social History   Tobacco Use   Smoking status: Current Every Day Smoker    Packs/day:  0.50    Years: 30.00    Pack years: 15.00    Types: Cigarettes   Smokeless tobacco: Never Used   Tobacco comment: 5-10 cigs a day  Substance Use Topics   Alcohol use: No    Alcohol/week: 0.0 standard drinks    Comment:     Drug use: No     Allergies   Cephalexin, Iron, Milk-related compounds, Penicillins, and Phenazopyridine hcl   Review of Systems Review of Systems  Constitutional: Negative for fever.  HENT: Negative for sore throat.   Eyes: Negative for visual disturbance.  Respiratory: Negative for cough and shortness of breath.   Cardiovascular: Negative for chest pain.  Gastrointestinal: Positive for abdominal pain, constipation and nausea. Negative for diarrhea and vomiting.  Genitourinary: Negative for dysuria and hematuria.  Musculoskeletal: Negative for neck pain.  Skin: Negative for rash.  Neurological: Negative for headaches.     Physical Exam Updated Vital Signs BP (!) 176/67 (BP Location: Right Arm)    Pulse 77    Temp 98.2 F (36.8 C) (Oral)    Resp 16    Ht 5\' 1"  (1.549 m)    Wt 78.5 kg    SpO2 99%    BMI 32.69 kg/m   Physical Exam Vitals signs and nursing note reviewed.  Constitutional:      General: She is not in acute distress.    Appearance: She is well-developed.  HENT:     Head: Normocephalic and atraumatic.  Eyes:     Conjunctiva/sclera: Conjunctivae normal.  Neck:     Musculoskeletal: Neck supple.  Cardiovascular:     Rate and Rhythm: Normal rate and regular rhythm.     Heart sounds: No murmur.  Pulmonary:     Effort: Pulmonary effort is normal. No respiratory distress.     Breath sounds: Normal breath sounds.  Abdominal:     General: There is distension.     Palpations: There is no pulsatile mass.     Tenderness: There is abdominal tenderness (generalized). There is no guarding or rebound.  Musculoskeletal: Normal range of motion.     Right lower leg: No edema.     Left lower leg: No edema.  Skin:    General: Skin is warm and  dry.     Capillary Refill: Capillary refill takes less than 2 seconds.  Neurological:     General: No focal deficit present.     Mental Status: She is alert.      ED Treatments / Results  Labs (all labs ordered are listed, but only abnormal results are displayed) Labs Reviewed  CBC WITH DIFFERENTIAL/PLATELET - Abnormal; Notable for the following components:      Result Value   Hemoglobin 8.7 (*)    HCT 31.1 (*)    MCV 79.9 (*)    MCH 22.4 (*)    MCHC 28.0 (*)  All other components within normal limits  COMPREHENSIVE METABOLIC PANEL - Abnormal; Notable for the following components:   Potassium 3.3 (*)    Glucose, Bld 174 (*)    Calcium 8.5 (*)    Total Protein 6.4 (*)    All other components within normal limits  LIPASE, BLOOD  URINALYSIS, ROUTINE W REFLEX MICROSCOPIC  POC OCCULT BLOOD, ED    EKG None  Radiology Ct Abdomen Pelvis W Contrast  Result Date: 12/13/2018 CLINICAL DATA:  Abdominal pain, recent discharge for constipation and small-bowel obstruction EXAM: CT ABDOMEN AND PELVIS WITH CONTRAST TECHNIQUE: Multidetector CT imaging of the abdomen and pelvis was performed using the standard protocol following bolus administration of intravenous contrast. CONTRAST:  167mL OMNIPAQUE IOHEXOL 300 MG/ML  SOLN COMPARISON:  CT abdomen pelvis 12/04/2018 FINDINGS: Lower chest: Bandlike areas of opacity are present in the otherwise clear lung bases. Normal heart size. No pericardial effusion. Atherosclerotic calcification of the coronary arteries. Hepatobiliary: No focal liver abnormality is seen. Patient is post cholecystectomy. Slight prominence of the biliary tree likely related to reservoir effect. No calcified intraductal gallstones. Pancreas: Unremarkable. No pancreatic ductal dilatation or surrounding inflammatory changes. Spleen: Normal in size without focal abnormality. Adrenals/Urinary Tract: Adrenal glands are unremarkable. Kidneys are normal, without renal calculi, focal  lesion, or hydronephrosis. No extravasation of contrast is seen on excretory phase delayed imaging. Urinary bladder is largely decompressed at the time of exam and therefore poorly evaluated by CT imaging. Stomach/Bowel: Distal esophagus, stomach and duodenal sweep are unremarkable. No small bowel wall thickening or dilatation. No evidence of obstruction. A normal appendix is visualized. No colonic dilatation or wall thickening. Scattered colonic diverticula without focal pericolonic inflammation to suggest diverticulitis. Vascular/Lymphatic: Atherosclerotic plaque within the normal caliber aorta. No aneurysm or ectasia. No suspicious or enlarged lymph nodes in the included lymphatic chains. Reproductive: Uterus is surgically absent. No concerning adnexal lesions. Other: No abdominopelvic free fluid or free gas. No bowel containing hernias. Soft tissue stranding of the anterior abdominal wall with multiple subcutaneous nodules likely related to injectable use. Additional injection granulomata are noted posteriorly. Musculoskeletal: Multilevel degenerative changes are present in the imaged portions of the spine. No acute osseous abnormality or suspicious osseous lesion. IMPRESSION: No acute intra-abdominal process. No evidence of residual bowel obstruction. Colonic diverticulosis without evidence of acute diverticulitis. Normal appendix. Post cholecystectomy, hysterectomy Aortic Atherosclerosis (ICD10-I70.0). Electronically Signed   By: Lovena Le M.D.   On: 12/13/2018 15:32    Procedures Procedures (including critical care time)  Medications Ordered in ED Medications  ondansetron (ZOFRAN) injection 4 mg (has no administration in time range)  sodium chloride 0.9 % bolus 500 mL (has no administration in time range)     Initial Impression / Assessment and Plan / ED Course  I have reviewed the triage vital signs and the nursing notes.  Pertinent labs & imaging results that were available during my care  of the patient were reviewed by me and considered in my medical decision making (see chart for details).  Clinical Course as of Dec 12 1648  Fri Dec 13, 2018  1350 Rectal exam done with nurse Apolonio Schneiders chaperone.  Probable slightly decreased rectal tone, no masses noted, some soft stool in vault.   [MB]    Clinical Course User Index [MB] Hayden Rasmussen, MD   Rebbeca Paul Santalucia was evaluated in Emergency Department on 12/13/2018 for the symptoms described in the history of present illness. She was evaluated in the context of the [place] COVID-19  pandemic, which necessitated consideration that the patient might be at risk for infection with the SARS-CoV-2 virus that causes COVID-19. Institutional protocols and algorithms that pertain to the evaluation of patients at risk for COVID-19 are in a state of rapid change based on information released by regulatory bodies including the CDC and federal and state organizations. These policies and algorithms were followed during the patient's care in the ED.      Final Clinical Impressions(s) / ED Diagnoses   Final diagnoses:  Lower abdominal pain  Constipation, unspecified constipation type    ED Discharge Orders    None       Hayden Rasmussen, MD 12/13/18 1651

## 2018-12-13 NOTE — ED Triage Notes (Signed)
C/o constipation.  Was discharge from [facility] on Sunday for constipation and SBO, treated with laxatives during stay.  Last BM this am, small amount brown stool.  C/o pain rating pain 5/10.

## 2018-12-13 NOTE — Discharge Instructions (Addendum)
You were evaluated in the Emergency Department and after careful evaluation, we did not find any emergent condition requiring admission or further testing in the hospital.  Your exam/testing today was overall reassuring.  Please use the medication provided to help relieve your constipation.  Please return to the Emergency Department if you experience any worsening of your condition.  We encourage you to follow up with a primary care provider.  Thank you for allowing [name] to be a part of your care.

## 2018-12-13 NOTE — Progress Notes (Signed)
Virtual Visit via Telephone Note   This visit type was conducted due to national recommendations for restrictions regarding the COVID-19 Pandemic (e.g. social distancing) in an effort to limit this patient's exposure and mitigate transmission in our community.  Due to her co-morbid illnesses, this patient is at least at moderate risk for complications without adequate follow up.  This format is felt to be most appropriate for this patient at this time.  The patient did not have access to video technology/had technical difficulties with video requiring transitioning to audio format only (telephone).  All issues noted in this document were discussed and addressed.  No physical exam could be performed with this format.   Evaluation Performed:  Follow-up visit  Date:  12/13/2018   ID:  [name], DOB 03-27-1950, MRN [id number]  Patient Location: Home Provider Location: Office  Location of Patient: Home Location of Provider: Telehealth Consent was obtain for visit to be over via telehealth. I verified that I am speaking with the correct person using two identifiers.  PCP:  [name], MD   Chief Complaint:  TOC  History of Present Illness:    [name] is a 68 y.o. 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  Reports today for TOC after being in the hospital secondary to a small bowel obstruction.  Admitted on September 23 of 2020.  She was admitted for abdominal distention, abdominal pain, NG tube was placed she had some nausea and vomiting as well.  Her emesis started on 922 the day before she went to the hospital.  She denied fevers and chills but post hospital she was found to have a temp of 101.  There was no evidence of a UTI.  WBCs were 10.6.  Abdominal CT demonstrated small bowel obstruction with  evidence of transition zone and proximal ileal region.  Hospital course: SBO recurrent with history of same.  NG tube placed.  Abdominal x-ray showed progression of contrast material through the colon.  Had 15 small bowel movements while in the hospital.  Tolerating p.o.'s well.  No significant abdominal pain or nausea at discharge.  Febrile illness had resolved.  She was discharged on Dulcolax suppositories advised to continue MiraLAX.  Follow-up with PCP within the week.  Maintain soft diet.  Increase activity slowly.  Today she reports that she had vomiting yesterday she is unsure what color it was.  She reports that her appetite is poor as her belly gets very full easily.  She additionally reports that she has abdominal pain again.  She has not had a bowel movement since leaving the hospital on September 27.  That was 5 days ago as of today.  She reports that she does not feel good overall.  She can tell that she is not doing well.  She does not want to go back to the hospital even though she was asked to consider doing this.  The patient does not have symptoms concerning for COVID-19 infection (fever, chills, cough, or new shortness of breath).   Past Medical, Surgical, Social History, Allergies, and Medications have been Reviewed.  Past Medical History:  Diagnosis Date  . Allergy   . Anemia   . Anxiety    takes Ativan daily  . Arthritis   . Assistance needed for mobility   . Bipolar disorder ([place])  takes Risperdal nightly  . Blood transfusion   . Cancer ([place])    In her gum  . Carpal tunnel syndrome of right wrist 05/23/2011  . Cervical disc disorder with radiculopathy of cervical region 10/31/2012  . Chronic back pain   . Chronic idiopathic constipation   . Chronic neck and back pain   . Colon polyps   . COPD (chronic obstructive pulmonary disease) with chronic bronchitis ([place]) 09/16/2013   Office Spirometry 10/30/2013-submaximal effort based on appearance of loop and curve. Numbers  would fit with severe restriction but her physiologic capability may be better than this. FVC 0.91/44%, and 10.74/45%, FEV1/FVC 0.81, FEF 25-75% 1.43/69%    . Depression    takes Zoloft daily  . Diabetes mellitus    Type II  . Diverticulosis    TCS 9/08 by Dr. Delfin Edis for diarrhea . Bx for micro scopic colitis negative.   . Fibromyalgia   . Frequent falls   . GERD (gastroesophageal reflux disease)    takes Aciphex daily  . Glaucoma    eye drops daily  . Gum symptoms    infection on antibiotic  . Hemiplegia affecting non-dominant side, post-stroke 08/02/2011  . Hyperlipidemia    takes Crestor daily  . Hypertension    takes Amlodipine,Metoprolol,and Clonidine daily  . Hypothyroidism    takes Synthroid daily  . IBS (irritable bowel syndrome)   . Insomnia    takes Trazodone nightly  . 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 Metabolic encephalopathy [id number]  . Migraines    chronic headaches  . Mononeuritis lower limb   . Narcolepsy   . Osteoporosis   . Pancreatitis 2006   due to Depakote with normal EUS   . Schatzki's ring    non critical / EGD with ED 8/2011with RMR  . Seizures ([place])    takes Lamictal daily.Last seizure 3 yrs ago  . Sleep apnea    on CPAP  . Stroke [facility])    left sided weakness, speech changes  . Tubular adenoma of colon    Past Surgical History:  Procedure Laterality Date  . ABDOMINAL HYSTERECTOMY  1978  . BACK SURGERY  July 2012  . BACTERIAL OVERGROWTH TEST N/A 05/05/2013   Procedure: BACTERIAL OVERGROWTH TEST;  Surgeon: Daneil Dolin, MD;  [facility];  Service: Endoscopy;  Laterality: N/A;  7:30  . BIOPSY THYROID  2009  . BRAIN SURGERY  11/2011   resection of meningioma  . BREAST REDUCTION SURGERY  1994  . CARDIAC CATHETERIZATION  05/10/2005   normal coronaries, normal LV systolic function and EF (Dr. Jackie Plum)  . CARPAL TUNNEL RELEASE Left 07/22/04   Dr. Aline Brochure  . CATARACT EXTRACTION Bilateral   . CHOLECYSTECTOMY  1984  .  COLONOSCOPY N/A 09/25/2012   [id number] diverticulosis.  colonic polyp-removed : tubular adenoma  . CRANIOTOMY  11/23/2011   Procedure: CRANIOTOMY TUMOR EXCISION;  Surgeon: Hosie Spangle, MD;  Location: [place] [facility]URO ORS;  Service: Neurosurgery;  Laterality: N/A;  Craniotomy for tumor resection  . ESOPHAGOGASTRODUODENOSCOPY  12/29/2010   [place]-Retained food in the esophagus and stomach, small hiatal hernia, status post Maloney dilation of the esophagus  . ESOPHAGOGASTRODUODENOSCOPY N/A 09/25/2012   [id number] atonic baggy esophagus status post Maloney dilation 72 F. Hiatal hernia  . GIVENS CAPSULE STUDY N/A 01/15/2013   NORMAL.   . IR GENERIC HISTORICAL  03/17/2016   IR RADIOLOGIST EVAL & MGMT 03/17/2016 [facility]-INTERV RAD  . LESION REMOVAL N/A 05/31/2015   Procedure: REMOVAL RIGHT AND  LEFT LESIONS OF MANDIBLE;  Surgeon: Diona Browner, DDS;  Location: [place];  Service: Oral Surgery;  Laterality: N/A;  . MALONEY DILATION  12/29/2010   RMR;  . NM MYOCAR PERF WALL MOTION  2006   "relavtiely normal" persantine, mild anterior thinning (breast attenuation artifact), no region of scar/ischemia  . OVARIAN CYST REMOVAL    . RECTOCELE REPAIR N/A 06/29/2015   Procedure: POSTERIOR REPAIR (RECTOCELE);  Surgeon: Jonnie Kind, MD;  Location: [facility];  Service: Gynecology;  Laterality: N/A;  . REDUCTION MAMMAPLASTY Bilateral   . SPINE SURGERY  09/29/2010   Dr. Rolena Infante  . surgical excision of 3 tumors from right thigh and right buttock  and left upper thigh  2010  . TOOTH EXTRACTION Bilateral 12/14/2014   Procedure: REMOVAL OF BILATERAL MANDIBULAR EXOSTOSES;  Surgeon: Diona Browner, DDS;  Location: [place];  Service: Oral Surgery;  Laterality: Bilateral;  . TRANSTHORACIC ECHOCARDIOGRAM  2010   EF 60-65%, mild conc LVH, grade 1 diastolic dysfunction; mildly calcified MV annulus with mildly thickened leaflets, mildly calcified MR annulus     Current Meds  Medication Sig  . ACCU-CHEK AVIVA PLUS test strip CHECK BLOOD  SUGAR FOUR TIMES DAILY  . acetaminophen (TYLENOL) 325 MG tablet Take 2 tablets (650 mg total) by mouth every 6 (six) hours as needed for mild pain or headache (or Fever >/= 101).  [name] albuterol (VENTOLIN HFA) 108 (90 Base) MCG/ACT inhaler Inhale 1-2 puffs every 6 hours as needed for wheezing, shortness of breath  . amLODipine (NORVASC) 10 MG tablet Take 1 tablet (10 mg total) by mouth daily.  [name] ascorbic acid (VITAMIN C) 500 MG tablet Take 500 mg by mouth daily.  [name] aspirin EC 81 MG tablet Take 1 tablet (81 mg total) by mouth daily with breakfast.  . bisacodyl (DULCOLAX) 10 MG suppository Place 1 suppository (10 mg total) rectally daily as needed for mild constipation or moderate constipation.  . cetirizine (ZYRTEC) 10 MG tablet Take 1 tablet (10 mg total) by mouth daily.  . Cholecalciferol (VITAMIN D3) 5000 units CAPS Take 1 capsule by mouth daily.  . clobetasol cream (TEMOVATE) [id number] % Apply 1 application topically 2 (two) times daily.   . insulin aspart (NOVOLOG FLEXPEN) 100 UNIT/ML FlexPen INJECT 10 TO 14 UNITS INTO THE SKIN 3 (THREE) TIMES DAILY WITH MEALS.  [name] Insulin Glargine (TOUJEO SOLOSTAR) 300 UNIT/ML SOPN Inject 20 Units into the skin at bedtime.  . lamoTRIgine (LAMICTAL) 100 MG tablet Take 1 tablet (100 mg total) by mouth 2 (two) times daily.  [name] levothyroxine (SYNTHROID) 50 MCG tablet Take 1 tablet (50 mcg total) by mouth daily before breakfast.  . LORazepam (ATIVAN) 0.5 MG tablet Take 1 tablet (0.5 mg total) by mouth 2 (two) times daily.  [name] losartan (COZAAR) 50 MG tablet TAKE 1 TABLET EVERY DAY  . metaxalone (SKELAXIN) 800 MG tablet metaxalone 800 mg tablet  take 1 tablet by mouth 3 times daily as needed.  . metoprolol tartrate (LOPRESSOR) 50 MG tablet Take 1 tablet (50 mg total) by mouth 2 (two) times daily.  . montelukast (SINGULAIR) 10 MG tablet TAKE 1 TABLET EVERY DAY  . Multiple Vitamins-Minerals (HM MULTIVITAMIN ADULT GUMMY PO) Take 1 tablet by mouth daily.  [name] nystatin  (MYCOSTATIN/NYSTOP) powder Apply to affected areas four times daily as needed.  . ondansetron (ZOFRAN) 4 MG tablet Take 1 tablet (4 mg total) by mouth every 8 (eight) hours as needed for nausea or vomiting.  [name] oxyCODONE-acetaminophen (PERCOCET) 10-325 MG  tablet   . pregabalin (LYRICA) 75 MG capsule Take 1 capsule (75 mg total) by mouth daily.  . RABEprazole (ACIPHEX) 20 MG tablet TAKE 1 TABLET TWICE DAILY  . RESTASIS 0.05 % ophthalmic emulsion Place 1 drop into both eyes 2 (two) times daily.   . risperiDONE (RISPERDAL) 0.5 MG tablet Take 1 tablet (0.5 mg total) by mouth at bedtime.  . rosuvastatin (CRESTOR) 5 MG tablet Take 1 tablet (5 mg total) by mouth at bedtime.  . sertraline (ZOLOFT) 100 MG tablet Take 1 tablet (100 mg total) by mouth daily.  . Tiotropium Bromide-Olodaterol (STIOLTO RESPIMAT) 2.5-2.5 MCG/ACT AERS Inhale 2 puffs into the lungs daily.  . traZODone (DESYREL) 100 MG tablet Take 1 tablet (100 mg total) by mouth at bedtime.  . triamcinolone ointment (KENALOG) 0.1 % Apply 1 application topically at bedtime. Apply to elbows at bedtime.     Allergies:   Cephalexin, Iron, Milk-related compounds, Penicillins, and Phenazopyridine hcl   Social History   Tobacco Use  . Smoking status: Current Every Day Smoker    Packs/day: 0.50    Years: 30.00    Pack years: 15.00    Types: Cigarettes  . Smokeless tobacco: Never Used  . Tobacco comment: 5-10 cigs a day  Substance Use Topics  . Alcohol use: No    Alcohol/week: 0.0 standard drinks    Comment:    . Drug use: No     Family Hx: The patient's family history includes Alcohol abuse in her maternal uncle; Cancer in her sister and sister; Diabetes in her brother, brother, and father; Heart attack in her maternal grandfather and mother; Hypertension in her brother and son; Kidney failure in her father; Pancreatic cancer in her sister; Pneumonia in her father; Sleep apnea in her son; Stroke in her maternal grandmother. There is no  history of Colon cancer, Anesthesia problems, Hypotension, Malignant hyperthermia, Pseudochol deficiency, or Breast cancer.  ROS:   Please see the history of present illness.    All other systems reviewed and are negative.   Labs/Other Tests and Data Reviewed:    Recent Labs: 09/30/2018: TSH 0.82 12/04/2018: ALT 23 12/07/2018: Hemoglobin 9.8; Platelets 211 12/08/2018: BUN 7; Creatinine, Ser 0.74; Potassium 3.3; Sodium 140   Recent Lipid Panel Lab Results  Component Value Date/Time   CHOL 149 09/30/2018 11:43 AM   CHOL 143 02/11/2018 01:10 PM   TRIG 173 (H) 09/30/2018 11:43 AM   HDL 43 (L) 09/30/2018 11:43 AM   HDL 53 02/11/2018 01:10 PM   CHOLHDL 3.5 09/30/2018 11:43 AM   LDLCALC 79 09/30/2018 11:43 AM    Wt Readings from Last 3 Encounters:  12/04/18 173 lb (78.5 kg)  10/10/18 174 lb 6.4 oz (79.1 kg)  09/30/18 178 lb (80.7 kg)     Objective:    Vital Signs:  There were no vitals taken for this visit.   GEN:  Alert and oriented RESPIRATORY:  No shortness of breath noted in conversation PSYCH:  Depressed mood, flat affect  ASSESSMENT & PLAN:    1. SBO (small bowel obstruction) ([place]) Unsure if this was occurred again. Strongly suggested she go to the ED to be evaluated for reoccurance. She does not want this, but her S&S are consistent with this being a possibly.   2. Generalized abdominal pain She reports increasing abdominal pain.  Reports that her belly feels distended to her.  Additionally she has not had a bowel movement since she left the hospital.  Reports that she took the  medication as directed.  But has not had success.  Again she is strongly encouraged to get back to ED to be evaluated for recurrence.  3. Nausea and vomiting, intractability of vomiting not specified, unspecified vomiting type She reports that she had some nausea.  Vomited yesterday.  She is unsure if it was green or not.  She reports that she gets full very easily.  And cannot eat and drink much.   She also cannot keep stuff down as much.  Again as stated above she was strongly suggested to go to the emergency room she reported that she really did not want to do this.  She might wait a day or 2 before she does.   Time:   Today, I have spent 10 minutes with the patient with telehealth technology discussing the above problems.     Medication Adjustments/Labs and Tests Ordered: Current medicines are reviewed at length with the patient today.  Concerns regarding medicines are outlined above.   Tests Ordered: No orders of the defined types were placed in this encounter.   Medication Changes: No orders of the defined types were placed in this encounter.   Disposition:  Follow up  12/16/2018   Signed, Perlie Mayo, NP  12/13/2018 10:45 AM     [place]

## 2018-12-13 NOTE — ED Provider Notes (Signed)
  Provider Note MRN:  [id number]  Arrival date & time: 12/13/18    ED Course and Medical Decision Making  Assumed care from Dr. Melina Copa at shift change.  Recent admission for constipation and question of [name] bowel obstruction, also managed nonsurgically with NG tube, discharged.  Patient explains no bowel movement since she was discharged, recurrence of symptoms, no impaction on exam, awaiting CT imaging.  5:40 PM update: Reassuring CT with no obstruction, labs reassuring, favored to be continued constipation, will provide aggressive bowel regimen.  Appropriate for discharge.  Final Clinical Impressions(s) / ED Diagnoses     ICD-10-CM   1. Lower abdominal pain  R10.30   2. Constipation, unspecified constipation type  K59.00     ED Discharge Orders         Ordered    polyethylene glycol (GOLYTELY) 236 g solution   Once     12/13/18 1747            Discharge Instructions     You were evaluated in the Emergency Department and after careful evaluation, we did not find any emergent condition requiring admission or further testing in the hospital.  Your exam/testing today was overall reassuring.  Please use the medication provided to help relieve your constipation.  Please return to the Emergency Department if you experience any worsening of your condition.  We encourage you to follow up with a primary care provider.  Thank you for allowing [name] to be a part of your care.      Barth Kirks. [name], [place] [email or web address]    Maudie Flakes, MD 12/13/18 [phone]

## 2018-12-13 NOTE — Patient Instructions (Signed)
    I appreciate the opportunity to provide you with the care for your health and wellness. Today we discussed: Possible recurrence of small bowel obstruction  Follow up: October 5 for flu vaccine  No labs or referrals today  Strongly suggested for you to go to the emergency room to be reevaluated for possible recurrence of your small bowel obstruction.  Given your symptoms increased discomfort, distention, nausea vomiting, inability to eat.  Very concerned that your obstruction might of reoccurred.  Please consider going to the emergency room and not waiting.  Please continue to practice social distancing to keep you, your family, and our community safe.  If you must go out, please wear a Mask and practice good handwashing.  GET FRESH AIR DAILY. STAY HYDRATED WITH WATER.   It was a pleasure to see you and I look forward to continuing to work together on your health and well-being. Please do not hesitate to call the office if you need care or have questions about your care.  Have a wonderful day and week. With Gratitude, Cherly Beach, DNP, AGNP-BC

## 2018-12-16 ENCOUNTER — Ambulatory Visit: Payer: Medicare HMO

## 2018-12-16 ENCOUNTER — Other Ambulatory Visit: Payer: Self-pay

## 2018-12-16 ENCOUNTER — Other Ambulatory Visit: Payer: Self-pay | Admitting: *Deleted

## 2018-12-16 ENCOUNTER — Telehealth: Payer: Self-pay | Admitting: *Deleted

## 2018-12-16 DIAGNOSIS — M179 Osteoarthritis of knee, unspecified: Secondary | ICD-10-CM | POA: Diagnosis not present

## 2018-12-16 DIAGNOSIS — I10 Essential (primary) hypertension: Secondary | ICD-10-CM | POA: Diagnosis not present

## 2018-12-16 DIAGNOSIS — M7542 Impingement syndrome of left shoulder: Secondary | ICD-10-CM | POA: Diagnosis not present

## 2018-12-16 DIAGNOSIS — K56699 Other intestinal obstruction unspecified as to partial versus complete obstruction: Secondary | ICD-10-CM | POA: Diagnosis not present

## 2018-12-16 DIAGNOSIS — Z9181 History of falling: Secondary | ICD-10-CM | POA: Diagnosis not present

## 2018-12-16 DIAGNOSIS — M5030 Other cervical disc degeneration, unspecified cervical region: Secondary | ICD-10-CM | POA: Diagnosis not present

## 2018-12-16 DIAGNOSIS — M159 Polyosteoarthritis, unspecified: Secondary | ICD-10-CM | POA: Diagnosis not present

## 2018-12-16 DIAGNOSIS — J449 Chronic obstructive pulmonary disease, unspecified: Secondary | ICD-10-CM | POA: Diagnosis not present

## 2018-12-16 DIAGNOSIS — R140 Abdominal distension (gaseous): Secondary | ICD-10-CM | POA: Diagnosis not present

## 2018-12-16 DIAGNOSIS — I69354 Hemiplegia and hemiparesis following cerebral infarction affecting left non-dominant side: Secondary | ICD-10-CM | POA: Diagnosis not present

## 2018-12-16 DIAGNOSIS — E1142 Type 2 diabetes mellitus with diabetic polyneuropathy: Secondary | ICD-10-CM | POA: Diagnosis not present

## 2018-12-16 DIAGNOSIS — D320 Benign neoplasm of cerebral meninges: Secondary | ICD-10-CM | POA: Diagnosis not present

## 2018-12-16 NOTE — Telephone Encounter (Signed)
Pt has been to ER and she has tried miralax and all the over the counter stuff. She says it feels like it comes down but she cant get it out. She has been doing suppositorys and miralax and laxatives. She needs something to help her go.

## 2018-12-16 NOTE — Telephone Encounter (Signed)
I recommend  Trial of magnesium citrate, also that she call her GI Doc

## 2018-12-16 NOTE — Telephone Encounter (Signed)
Please advise 

## 2018-12-16 NOTE — Patient Outreach (Signed)
[facility]) Care Management  12/16/2018  Anna Grenfell Pina 1950-04-15 [id number]   Telephone call to patient for follow up call. Patient able to verify HIPAA but not much up for talking.  She anted CM to speak with [facility] who is there for a visit.  Spoke with [name] she states that patient being treated for an constipation but not obstruction. She states that patient was prescribed go-lytely but patient unable to get prescription to the pharmacy.  She has called [name] to ask that prescription be sent over to [facility].  She states she checked patient for impaction but she felt nothing in her rectum but patient does feel nauseous.   Advised that CM would call back later in the week to talk with patient.  She states she will let patient know.    Plan: RN CM will attempt patient again later this week.    Jone Baseman, RN, MSN [place] [facility] Care Management Coordinator Direct Line [phone] Cell [phone] Toll Free: [phone]  Fax: [phone]

## 2018-12-16 NOTE — Telephone Encounter (Signed)
[name] RN with [facility] called said ER wrote pt a prescription for Go litely 236g solution 4080mL once. She is having trouble getting the prescription and wanted to know if Dr Moshe Cipro could send this in for her. She can be reached at [phone]

## 2018-12-17 ENCOUNTER — Ambulatory Visit: Payer: Medicare HMO | Admitting: Internal Medicine

## 2018-12-17 DIAGNOSIS — D320 Benign neoplasm of cerebral meninges: Secondary | ICD-10-CM | POA: Diagnosis not present

## 2018-12-17 DIAGNOSIS — M7542 Impingement syndrome of left shoulder: Secondary | ICD-10-CM | POA: Diagnosis not present

## 2018-12-17 DIAGNOSIS — J449 Chronic obstructive pulmonary disease, unspecified: Secondary | ICD-10-CM | POA: Diagnosis not present

## 2018-12-17 DIAGNOSIS — I69354 Hemiplegia and hemiparesis following cerebral infarction affecting left non-dominant side: Secondary | ICD-10-CM | POA: Diagnosis not present

## 2018-12-17 DIAGNOSIS — E1142 Type 2 diabetes mellitus with diabetic polyneuropathy: Secondary | ICD-10-CM | POA: Diagnosis not present

## 2018-12-17 DIAGNOSIS — M159 Polyosteoarthritis, unspecified: Secondary | ICD-10-CM | POA: Diagnosis not present

## 2018-12-17 DIAGNOSIS — K56699 Other intestinal obstruction unspecified as to partial versus complete obstruction: Secondary | ICD-10-CM | POA: Diagnosis not present

## 2018-12-17 DIAGNOSIS — R140 Abdominal distension (gaseous): Secondary | ICD-10-CM | POA: Diagnosis not present

## 2018-12-17 DIAGNOSIS — M5030 Other cervical disc degeneration, unspecified cervical region: Secondary | ICD-10-CM | POA: Diagnosis not present

## 2018-12-17 NOTE — Telephone Encounter (Signed)
Can you let her know please

## 2018-12-17 NOTE — Telephone Encounter (Signed)
Called pt she had tried the magnesium citrate and she has finally broke loose and is feeling better. She has a GI appointment in October

## 2018-12-17 NOTE — Telephone Encounter (Signed)
FYI

## 2018-12-17 NOTE — Telephone Encounter (Signed)
Noted, thanks!

## 2018-12-19 ENCOUNTER — Other Ambulatory Visit: Payer: Self-pay

## 2018-12-19 ENCOUNTER — Ambulatory Visit (INDEPENDENT_AMBULATORY_CARE_PROVIDER_SITE_OTHER): Payer: Medicare HMO | Admitting: Psychiatry

## 2018-12-19 DIAGNOSIS — F331 Major depressive disorder, recurrent, moderate: Secondary | ICD-10-CM

## 2018-12-19 NOTE — Patient Outreach (Signed)
[facility]) Care Management  12/19/2018  [name] [birth year] [id number]   Telephone call to patient for follow up.  Patient reports that she is feeling some down due to her medical problems. Patient states she talks to a counselor and has an appointment next week.  Patient declines any thoughts on harming herself.  Patient states that her constipation was relieved with citrate of Magnesium but she still has a hurting in the top of her stomach.  She takes colace as a stool softener and trying to limit pain medications as she knows that causes constipation.  She has an appointment with GI doctor on Monday.  Discussed use of stool softener and suggested that the GI doctor may need to put her on something different. She verbalized understanding.  She states her breathing right now is ok just depending on what she is doing. She is using daily inhaler and has rescue inhaler.  Discussed signs of worsening.  Patient has an adie 5 days a week for 8 hours and her aide [place] the [place] but uses transportation service for other appointments outside the county.  Advised patient that CM would call patient next week.  She verbalized understanding.    Plan: RN CM will outreach patient next week and she is agreeable.    Jone Baseman, RN, MSN [place] [facility] Care Management Coordinator Direct Line [phone] Cell [phone] Toll Free: [phone]  Fax: [phone]

## 2018-12-19 NOTE — Progress Notes (Signed)
Virtual Visit via Telephone Note  I connected with [name] on 12/19/18 at  9:00 AM EDT by telephone and verified that I am speaking with the correct person using two identifiers.   I discussed the limitations, risks, security and privacy concerns of performing an evaluation and management service by telephone and the availability of in person appointments. I also discussed with the patient that there may be a patient responsible charge related to this service. The patient expressed understanding and agreed to proceed.   I provided 50 minutes of non-face-to-face time during this encounter.   [name], LCSW              THERAPIST PROGRESS NOTE  Session Time: Thursday 12/19/2018 9:00 AM -  9:50 AM  Participation Level: Active  Behavioral Response: lethargic, less depressed   Type of Therapy: Individual Therapy  Treatment Goals addressed: Learn and implement behavioral strategies to overcome depression.                                                       Verbalize an understanding and resolution of current interpersonal problems.   Interventions: CBT and Supportive  Summary: [name] is a 68 y.o. female who presents with  long standing history of recurrent periods  of depression beginning when she was 59 and her favorite uncle died. Patient reports multiple psychiatric hospitlaizations due to depression and suicidal ideations with the last one occuring in 1997. Patient has participated in outpatient psychotherapy and medication management intermittently since age 58. She currently is seeing psychiatrist Dr. Harrington Challenger . Prior to this, she was seen at [facility]. Patient also has had ECT at [facility]. Symptoms have worsened in recent months due to family stress and have  included depressed mood, anxiety, excessive worry, and tearfulness.   Patient's last contact was by virtual visit via telephone  3-4 weeks ago. She states being up and down. She reports being hospitalized  for a few days due to a small bowel obstruction last month.  She had to go back to the ED this past weekend as she continued to have GI issues.  She is hoping to see her doctor Monday.  She expresses anger and frustration son did not visit her while she was in the hospital.  She reports he called twice and he transported her home from the hospital.  She also expresses frustration her granddaughter seems not to want to be around her as much.  She blames son's girlfriend and reports she has a negative influence on her granddaughter.  She states feeling as though she does not matter to her son.  She also expresses frustration she has not received more contact from friends and other family members.  She is pleased she received a recent phone call from her grandson.     Suicidal/Homicidal: Nowithout intent/plan  Therapist Response: Reviewed symptoms, discussed stressors, facilitated expression of thoughts and feelings, assisted patient identify underlying feelings beneath anger including hurt, validated feelings, assisted patient identify thought patterns regarding should and ought statements and effects on mood/behavior, assisted patient identify unrealistic demands and replace with desires, reviewed coping techniques and assisted patient identify ways to reconnect with her spirituality as a coping tool   Plan: Return again in 2 weeks.  Diagnosis: Axis I: MDD, Recurrent, moderate  Axis II: Deferred    Danille Oppedisano E Esperanza Madrazo, LCSW

## 2018-12-20 ENCOUNTER — Ambulatory Visit (HOSPITAL_COMMUNITY): Payer: Medicare HMO | Admitting: Nurse Practitioner

## 2018-12-20 ENCOUNTER — Ambulatory Visit (HOSPITAL_COMMUNITY): Payer: Medicare HMO | Admitting: Hematology

## 2018-12-21 DIAGNOSIS — I69354 Hemiplegia and hemiparesis following cerebral infarction affecting left non-dominant side: Secondary | ICD-10-CM | POA: Diagnosis not present

## 2018-12-21 DIAGNOSIS — M159 Polyosteoarthritis, unspecified: Secondary | ICD-10-CM | POA: Diagnosis not present

## 2018-12-21 DIAGNOSIS — M7542 Impingement syndrome of left shoulder: Secondary | ICD-10-CM | POA: Diagnosis not present

## 2018-12-21 DIAGNOSIS — K56699 Other intestinal obstruction unspecified as to partial versus complete obstruction: Secondary | ICD-10-CM | POA: Diagnosis not present

## 2018-12-21 DIAGNOSIS — E1142 Type 2 diabetes mellitus with diabetic polyneuropathy: Secondary | ICD-10-CM | POA: Diagnosis not present

## 2018-12-21 DIAGNOSIS — D320 Benign neoplasm of cerebral meninges: Secondary | ICD-10-CM | POA: Diagnosis not present

## 2018-12-21 DIAGNOSIS — M5030 Other cervical disc degeneration, unspecified cervical region: Secondary | ICD-10-CM | POA: Diagnosis not present

## 2018-12-21 DIAGNOSIS — J449 Chronic obstructive pulmonary disease, unspecified: Secondary | ICD-10-CM | POA: Diagnosis not present

## 2018-12-21 DIAGNOSIS — R140 Abdominal distension (gaseous): Secondary | ICD-10-CM | POA: Diagnosis not present

## 2018-12-23 ENCOUNTER — Ambulatory Visit (INDEPENDENT_AMBULATORY_CARE_PROVIDER_SITE_OTHER)
Admission: RE | Admit: 2018-12-23 | Discharge: 2018-12-23 | Disposition: A | Discharge status: Home / Self Care | Payer: Medicare HMO | Source: Ambulatory Visit | Attending: Physician Assistant | Admitting: Physician Assistant

## 2018-12-23 ENCOUNTER — Encounter: Payer: Self-pay | Admitting: Physician Assistant

## 2018-12-23 ENCOUNTER — Ambulatory Visit (INDEPENDENT_AMBULATORY_CARE_PROVIDER_SITE_OTHER): Payer: Medicare HMO | Admitting: Physician Assistant

## 2018-12-23 ENCOUNTER — Other Ambulatory Visit: Payer: Self-pay

## 2018-12-23 VITALS — BP 134/60 | HR 72 | Temp 98.4°F | Ht 59.0 in | Wt 174.4 lb

## 2018-12-23 DIAGNOSIS — K5900 Constipation, unspecified: Secondary | ICD-10-CM

## 2018-12-23 DIAGNOSIS — R6889 Other general symptoms and signs: Secondary | ICD-10-CM | POA: Diagnosis not present

## 2018-12-23 DIAGNOSIS — K6389 Other specified diseases of intestine: Secondary | ICD-10-CM | POA: Diagnosis not present

## 2018-12-23 MED ORDER — PLENVU 140 G PO SOLR: 1.0000 | Freq: Once | ORAL | 0 refills | Status: AC

## 2018-12-23 MED ORDER — LUBIPROSTONE 24 MCG PO CAPS: 24.0000 ug | ORAL_CAPSULE | Freq: Two times a day (BID) | ORAL | 6 refills | Status: DC

## 2018-12-23 NOTE — Patient Instructions (Signed)
Please go to the x-ray department in our basement before leaving today to get an abdominal x-ray taken.   We are giving you samples of amitiza today to try: Take one twice a day for constipation.    We have sent the following medications to your pharmacy for you to pick up at your convenience: Amitiza      We are providing you with a bowel prep kit today to take home and do a purge with.   Call us back in a week and ask for Beth, LPN to give her an update on how your doing.    I appreciate the opportunity to care for you. Amy Esterwood, PA-C

## 2018-12-24 ENCOUNTER — Telehealth: Payer: Self-pay

## 2018-12-24 ENCOUNTER — Encounter (HOSPITAL_COMMUNITY): Payer: Self-pay | Admitting: Nurse Practitioner

## 2018-12-24 ENCOUNTER — Encounter: Payer: Self-pay | Admitting: *Deleted

## 2018-12-24 ENCOUNTER — Ambulatory Visit: Payer: Self-pay | Admitting: Family Medicine

## 2018-12-24 ENCOUNTER — Telehealth: Payer: Self-pay | Admitting: Physician Assistant

## 2018-12-24 ENCOUNTER — Other Ambulatory Visit: Payer: Self-pay | Admitting: *Deleted

## 2018-12-24 ENCOUNTER — Encounter: Payer: Self-pay | Admitting: Physician Assistant

## 2018-12-24 ENCOUNTER — Inpatient Hospital Stay (HOSPITAL_BASED_OUTPATIENT_CLINIC_OR_DEPARTMENT_OTHER): Payer: Medicare HMO | Admitting: Nurse Practitioner

## 2018-12-24 DIAGNOSIS — F319 Bipolar disorder, unspecified: Secondary | ICD-10-CM | POA: Diagnosis not present

## 2018-12-24 DIAGNOSIS — Z86010 Personal history of colonic polyps: Secondary | ICD-10-CM | POA: Diagnosis not present

## 2018-12-24 DIAGNOSIS — D509 Iron deficiency anemia, unspecified: Secondary | ICD-10-CM

## 2018-12-24 DIAGNOSIS — K56699 Other intestinal obstruction unspecified as to partial versus complete obstruction: Secondary | ICD-10-CM | POA: Diagnosis not present

## 2018-12-24 DIAGNOSIS — Z7982 Long term (current) use of aspirin: Secondary | ICD-10-CM | POA: Diagnosis not present

## 2018-12-24 DIAGNOSIS — M797 Fibromyalgia: Secondary | ICD-10-CM | POA: Diagnosis not present

## 2018-12-24 DIAGNOSIS — M5030 Other cervical disc degeneration, unspecified cervical region: Secondary | ICD-10-CM | POA: Diagnosis not present

## 2018-12-24 DIAGNOSIS — E1142 Type 2 diabetes mellitus with diabetic polyneuropathy: Secondary | ICD-10-CM | POA: Diagnosis not present

## 2018-12-24 DIAGNOSIS — Z794 Long term (current) use of insulin: Secondary | ICD-10-CM | POA: Diagnosis not present

## 2018-12-24 DIAGNOSIS — J449 Chronic obstructive pulmonary disease, unspecified: Secondary | ICD-10-CM | POA: Diagnosis not present

## 2018-12-24 DIAGNOSIS — I69354 Hemiplegia and hemiparesis following cerebral infarction affecting left non-dominant side: Secondary | ICD-10-CM | POA: Diagnosis not present

## 2018-12-24 DIAGNOSIS — E119 Type 2 diabetes mellitus without complications: Secondary | ICD-10-CM | POA: Diagnosis not present

## 2018-12-24 DIAGNOSIS — K5900 Constipation, unspecified: Secondary | ICD-10-CM | POA: Diagnosis not present

## 2018-12-24 DIAGNOSIS — M810 Age-related osteoporosis without current pathological fracture: Secondary | ICD-10-CM | POA: Diagnosis not present

## 2018-12-24 DIAGNOSIS — R140 Abdominal distension (gaseous): Secondary | ICD-10-CM | POA: Diagnosis not present

## 2018-12-24 DIAGNOSIS — R5383 Other fatigue: Secondary | ICD-10-CM | POA: Diagnosis not present

## 2018-12-24 DIAGNOSIS — F17210 Nicotine dependence, cigarettes, uncomplicated: Secondary | ICD-10-CM | POA: Diagnosis not present

## 2018-12-24 DIAGNOSIS — I10 Essential (primary) hypertension: Secondary | ICD-10-CM | POA: Diagnosis not present

## 2018-12-24 DIAGNOSIS — R110 Nausea: Secondary | ICD-10-CM | POA: Diagnosis not present

## 2018-12-24 DIAGNOSIS — K219 Gastro-esophageal reflux disease without esophagitis: Secondary | ICD-10-CM | POA: Diagnosis not present

## 2018-12-24 DIAGNOSIS — G4730 Sleep apnea, unspecified: Secondary | ICD-10-CM | POA: Diagnosis not present

## 2018-12-24 DIAGNOSIS — M7542 Impingement syndrome of left shoulder: Secondary | ICD-10-CM | POA: Diagnosis not present

## 2018-12-24 DIAGNOSIS — Z8673 Personal history of transient ischemic attack (TIA), and cerebral infarction without residual deficits: Secondary | ICD-10-CM | POA: Diagnosis not present

## 2018-12-24 DIAGNOSIS — M159 Polyosteoarthritis, unspecified: Secondary | ICD-10-CM | POA: Diagnosis not present

## 2018-12-24 DIAGNOSIS — Z79899 Other long term (current) drug therapy: Secondary | ICD-10-CM | POA: Diagnosis not present

## 2018-12-24 DIAGNOSIS — D320 Benign neoplasm of cerebral meninges: Secondary | ICD-10-CM | POA: Diagnosis not present

## 2018-12-24 DIAGNOSIS — E785 Hyperlipidemia, unspecified: Secondary | ICD-10-CM | POA: Diagnosis not present

## 2018-12-24 IMAGING — CT CT RENAL STONE PROTOCOL
2 of 3 series · 16 of 46 positions shown, 18 images · non-contrast
Comparison: CT abdomen pelvis dated December 16, 2016.

CLINICAL DATA: Right flank pain.  Fall this morning.

EXAM:
CT ABDOMEN AND PELVIS WITHOUT CONTRAST
TECHNIQUE: Multidetector CT imaging of the abdomen and pelvis was performed
following the standard protocol without IV contrast.

[Series 2: axial st · axial · 0.77mm/px · z∈[+994,+1369]mm · 13 of 87 slices shown, 15 images]
[im 6/87  soft-tissue]
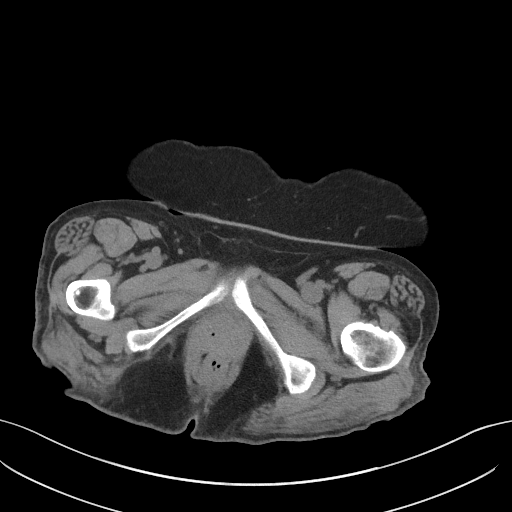
[im 6/87  bone]
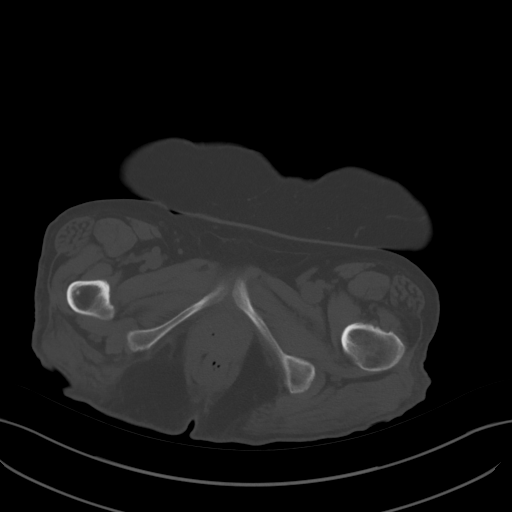
[im 12/87  soft-tissue]
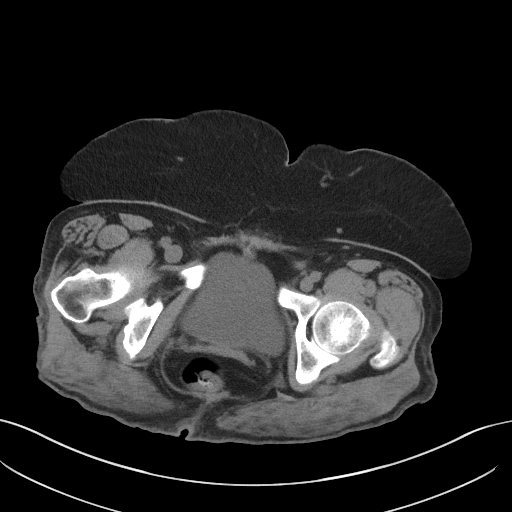
[im 17/87  soft-tissue]
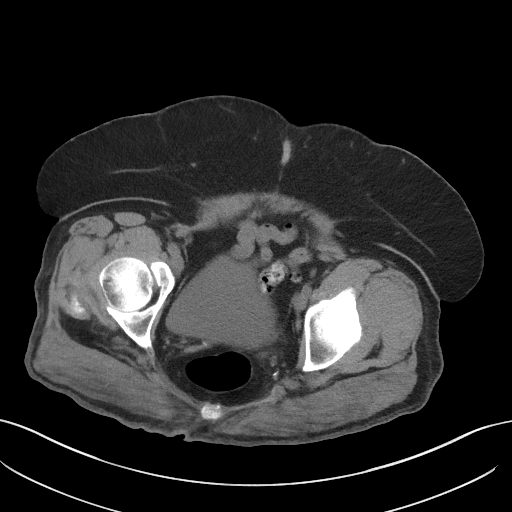
[im 25/87  soft-tissue]
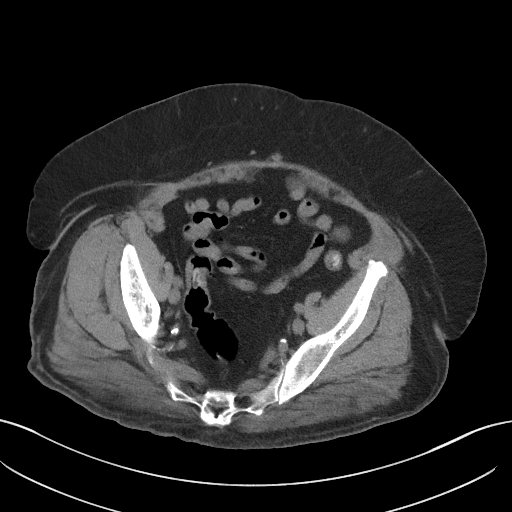
[im 31/87  soft-tissue]
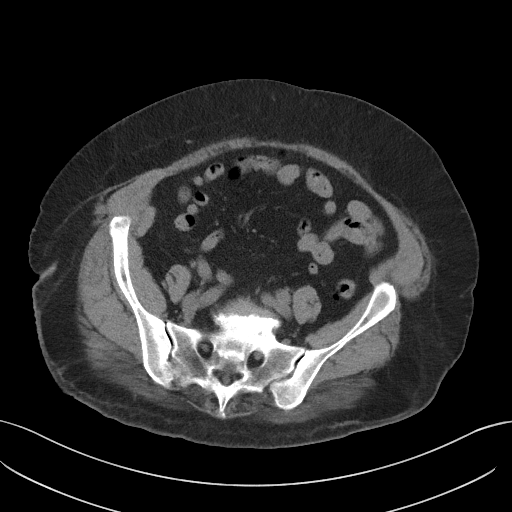
[im 37/87  soft-tissue]
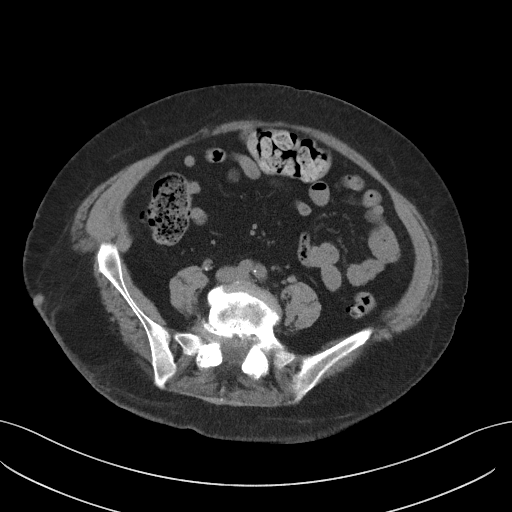
[im 45/87  soft-tissue]
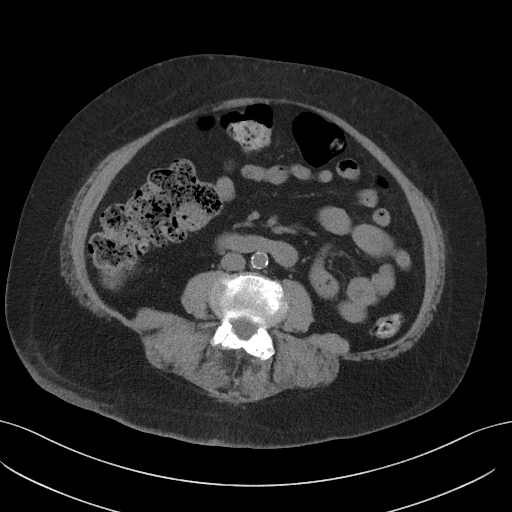
[im 50/87  soft-tissue]
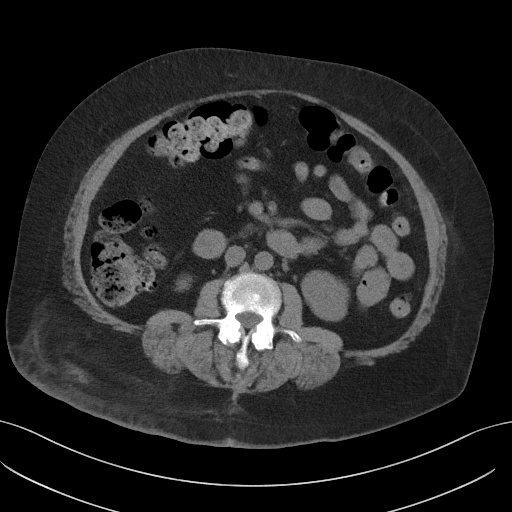
[im 56/87  soft-tissue]
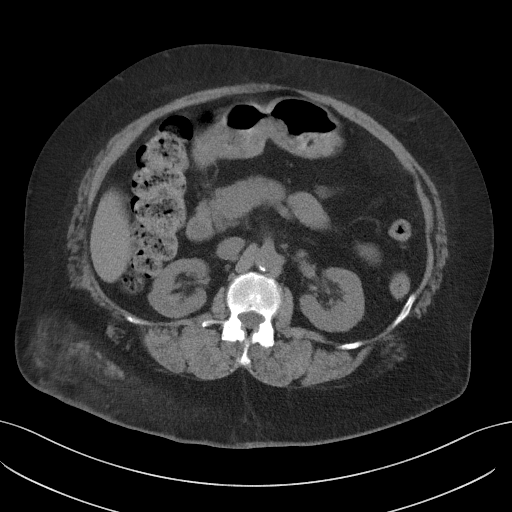
[im 56/87  bone]
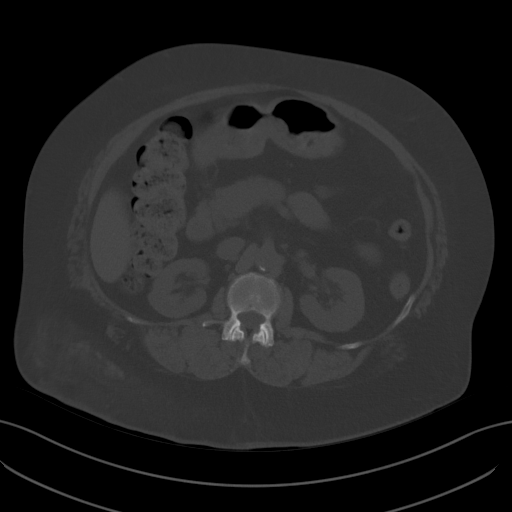
[im 62/87  soft-tissue]
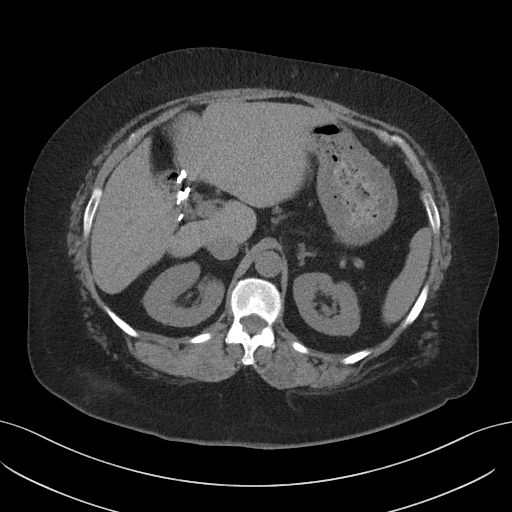
[im 70/87  soft-tissue]
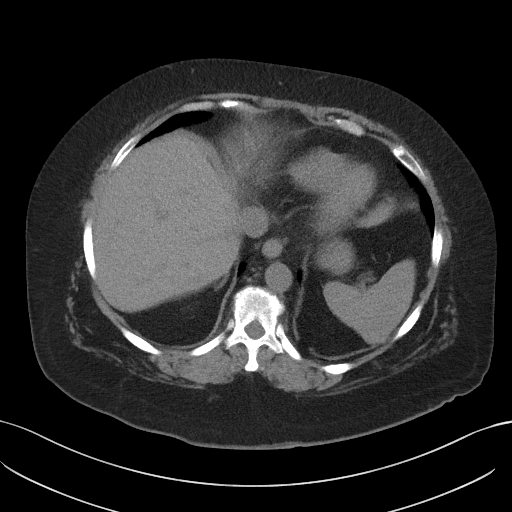
[im 75/87  soft-tissue]
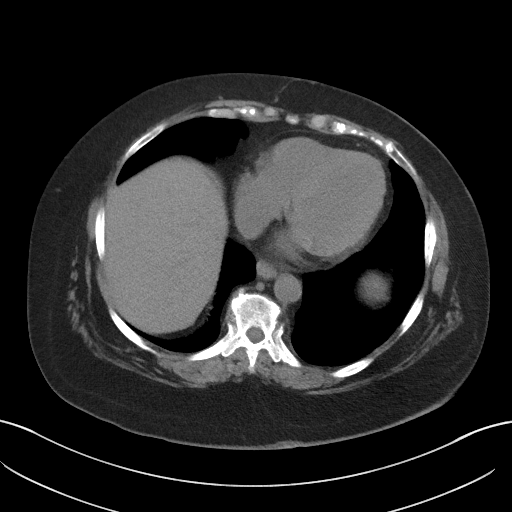
[im 81/87  soft-tissue]
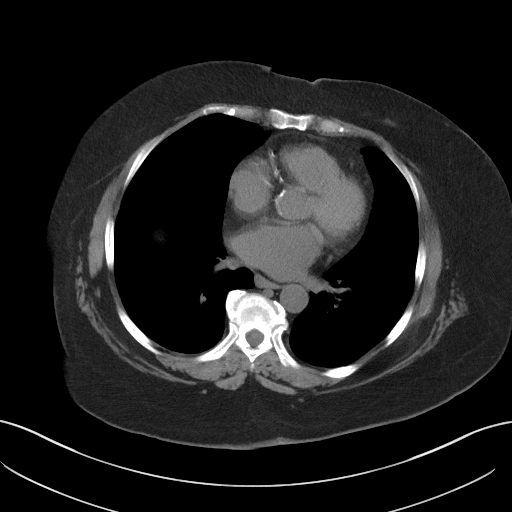

[Series 5: coronal st · coronal · 0.72mm/px · 3 of 93 slices shown]
[im 31/93  soft-tissue]
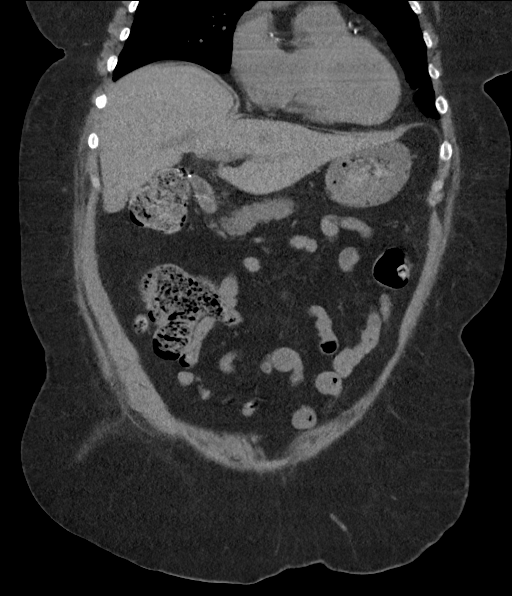
[im 41/93  soft-tissue]
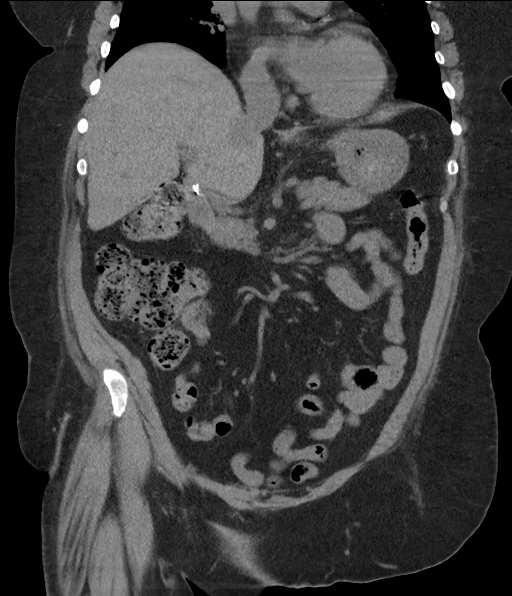
[im 52/93  soft-tissue]
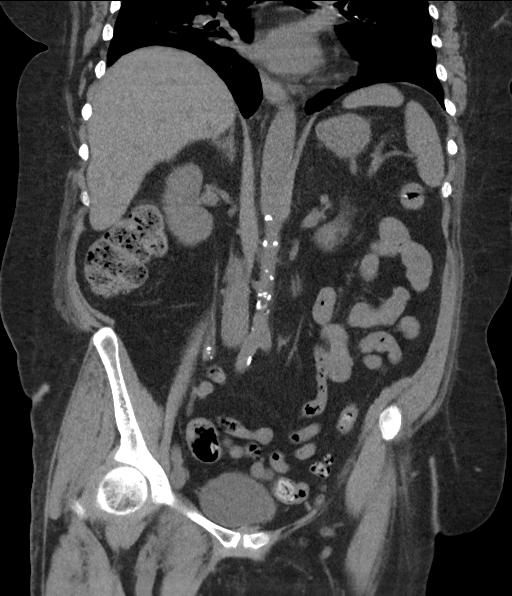

[16 of 46 positions shown; findings below may reference images not displayed]

FINDINGS: Lower chest: Scarring in the lingula is slightly more nodular in
appearance when compared to prior study, but overall similar in
size. No acute abnormality. Coronary artery disease.

Hepatobiliary: No focal liver abnormality is seen. No gallstones,
gallbladder wall thickening, or biliary dilatation.

Pancreas: Unremarkable. No pancreatic ductal dilatation or
surrounding inflammatory changes.

Spleen: Normal in size without focal abnormality.

Adrenals/Urinary Tract: Adrenal glands are unremarkable. Kidneys are
normal, without renal calculi, focal lesion, or hydronephrosis.
Bladder is unremarkable.

Stomach/Bowel: Stomach is within normal limits. Appendix appears
normal. No evidence of bowel wall thickening, distention, or
inflammatory changes. Mild colonic diverticulosis.

Vascular/Lymphatic: Aortic atherosclerosis. No enlarged abdominal or
pelvic lymph nodes.

Reproductive: Status post hysterectomy. No adnexal masses.

Other: No abdominal wall hernia or abnormality. No abdominopelvic
ascites. No pneumoperitoneum.

Musculoskeletal: Moderate right flank subcutaneous soft tissue
stranding and small hematoma. No acute or significant osseous
findings. Old healed left lateral seventh rib fracture. Moderate to
severe degenerative disc disease at L4-L5 and L5-S1 status post
prior posterior decompression, unchanged.
IMPRESSION: 1. Moderate right flank subcutaneous soft tissue stranding with
small hematoma, possibly related to recent trauma given fall earlier
today.
2. No acute osseous abnormality.
3. No renal or ureteral calculi.
4. Scarring in the lingula slightly more nodular in appearance when
compared to prior study, but overall similar in size, measuring 7
mm. Recommend follow-up chest CT in 6-12 months to evaluate for
interval change.

## 2018-12-24 IMAGING — DX DG RIBS W/ CHEST 3+V*R*
3 series · 3 of 3 positions shown · non-contrast
Comparison: 05/19/2017.

CLINICAL DATA: Right posterior lower rib pain following a fall this
morning. Ex-smoker.

EXAM:
RIGHT RIBS AND CHEST - 3+ VIEW

[chest pa]
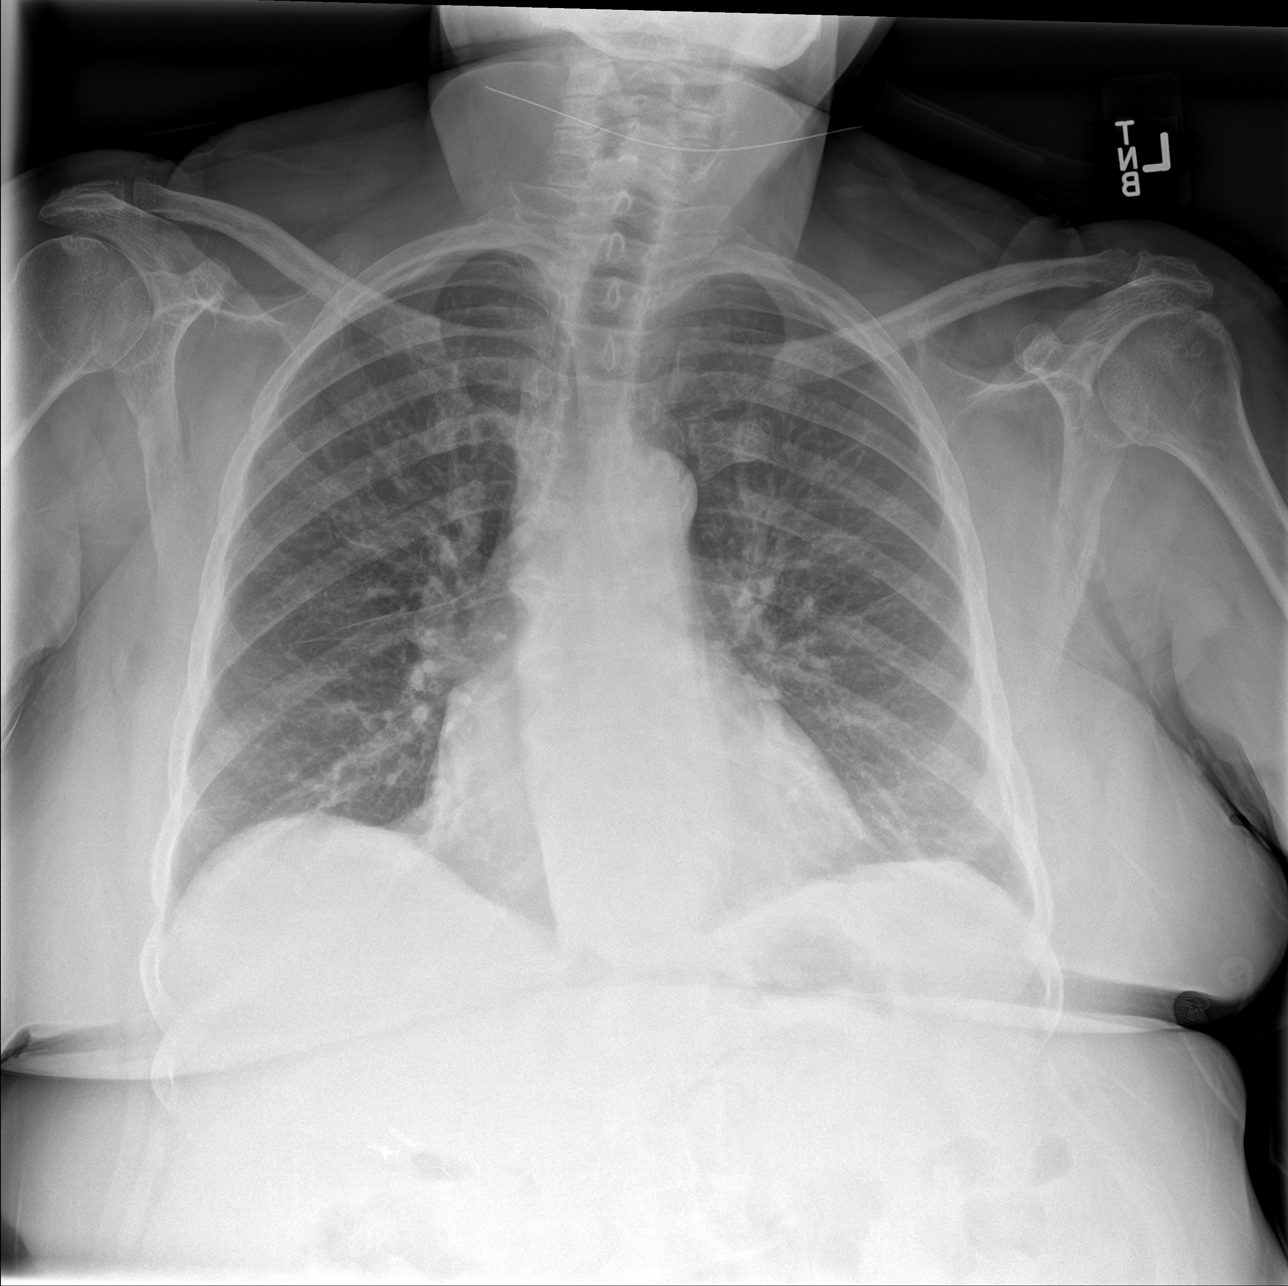

[rib pa]
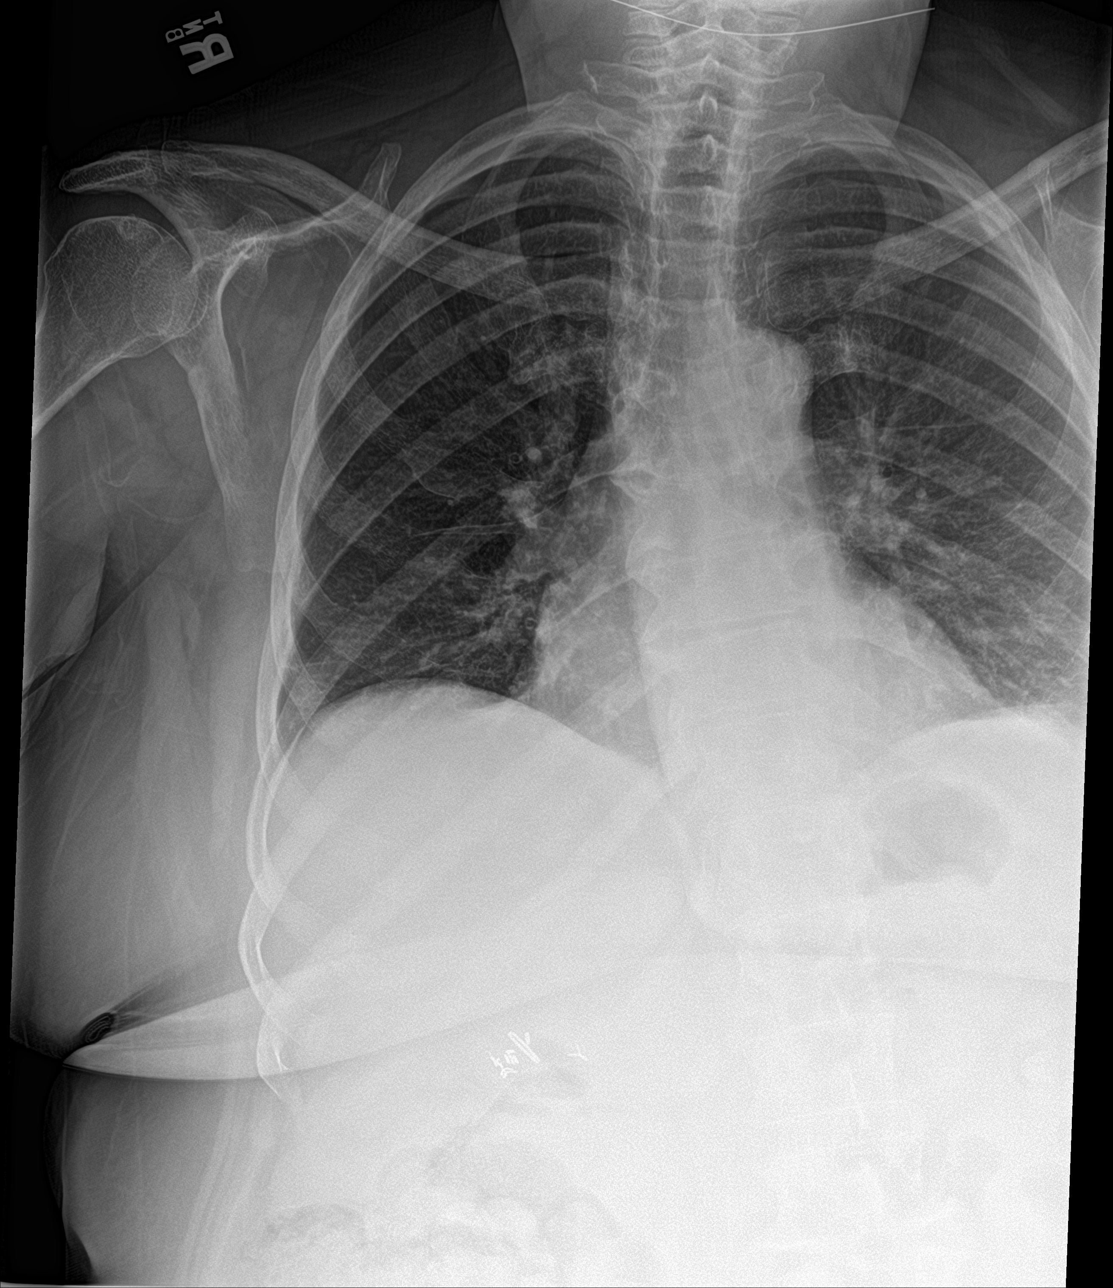

[rib pa obl]
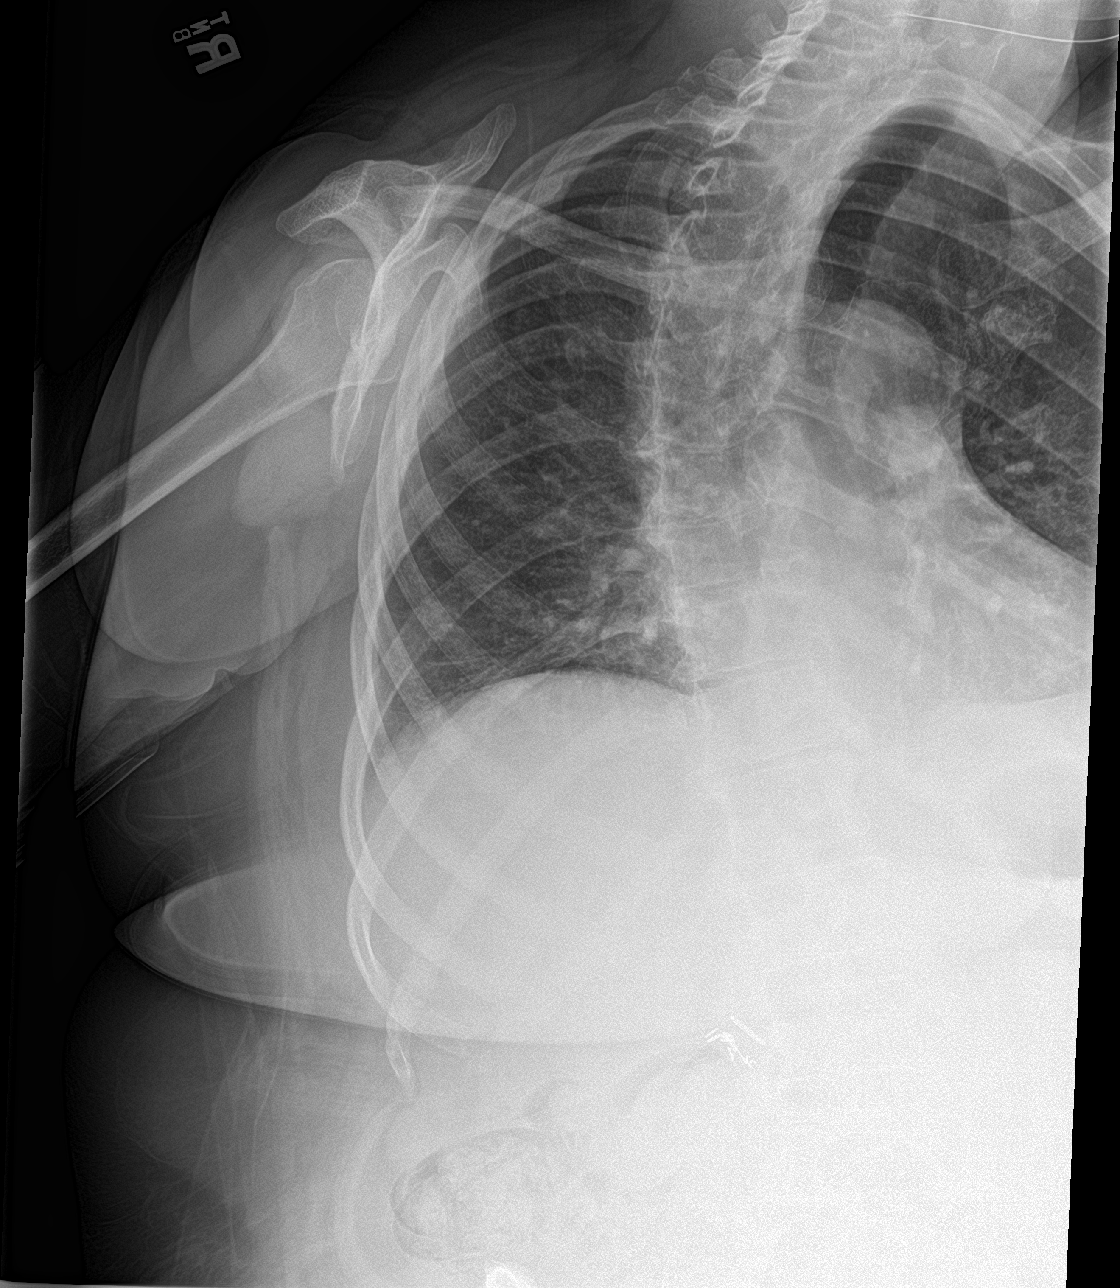

[3 of 3 positions shown; findings below may reference images not displayed]

FINDINGS: Normal sized heart. Clear lungs. No fracture or pneumothorax seen.
Thoracic spine degenerative changes and mild scoliosis.
Cholecystectomy clips.
IMPRESSION: No fracture or other acute abnormality.

## 2018-12-24 NOTE — Telephone Encounter (Signed)
See result note.  

## 2018-12-24 NOTE — Assessment & Plan Note (Addendum)
1.  Microcytic anemia: -This is a combination anemia from iron deficiency and chronic kidney disease. -Colonoscopy on 02/10/2016 showing 2 sessile polyps in the descending colon, diverticula in the sigmoid colon. -EGD done on 02/10/2016 showing mild to moderate esophagitis, erythema in the inferior gastric antrum. - She was placed on iron pills, but could not tolerate them due to constipation. -She denies any bright red bleeding per rectum or melena. - She last received IV Feraheme x2 doses in January 2020. -Labs done on 12/13/2018 showed hemoglobin 8.9, ferritin 68, percent saturation 9, platelets 201. -I have recommended to her get 2 more infusions of iron.  She reports she had nausea and itching after last treatment we will premedicate her prior. -She will follow-up in 3 months with repeat labs.

## 2018-12-24 NOTE — Telephone Encounter (Signed)
I have started a prior authorization for patient's Amitiza which is a non-formulary drug thru [facility]. It said to check back in 3-7 days for a decision.

## 2018-12-24 NOTE — Progress Notes (Signed)
Subjective:    Patient ID: [name], female    DOB: 1951/01/14, 68 y.o.   MRN: [id number]  HPI [name] is a pleasant 48 year old African-American female, known to Dr. Hilarie Fredrickson who comes in today with persistent complaints of abdominal pain fullness bloating and obstipation. She has history of small bowel obstruction in 2018, she is status post cholecystectomy and hysterectomy and rectocele repair.  Also with history of COPD, diverticulosis, sleep apnea, hypertension and major depression. She had an admission to [name] 920 3/2 through 12/08/2018 with a partial small bowel obstruction.  She was followed by surgery and managed conservatively with NG decompression.  Abdominal films on 926 after small bowel obstruction protocol showed small bowel obstruction versus ileus but with contrast progressing to the colon. She had return ER visit on 12/13/2018 with persistent complaints of abdominal pain and distention.  CT of the abdomen and pelvis at that time did not show any small bowel dilation or thickening and no evidence of obstruction.  She was directed by the ER to do a MiraLAX purge.  She says she has transportation difficulties and was not able to get to a store to do a purge at that time.  She tells me she took some other laxative prep that a friend gave her but did not have very good results. Over the past few weeks she is continuing to feel full and uncomfortable.  She says she is really not eating much food at all and has been drinking 3 or 4 boosts per day and drinking fluids.  She feels better off of solid food.  She has pain that comes and goes but it continued feeling of bloating and discomfort.  She has had nausea and some episodes of vomiting.  She is a bit vague on her bowel movements but says that she has been very constipated and with a lot of straining is only passing small pellet-like stools.  No recent rectal bleeding. Last EGD 2017 no evidence of Barrett's reactive gastropathy  H. pylori negative Colonoscopy November 2017 multiple diverticuli and 2 small polyps were removed 2 to 5 mm each and both tubular adenomas.  Review of Systems Pertinent positive and negative review of systems were noted in the above HPI section.  All other review of systems was otherwise negative.  Outpatient Encounter Medications as of 12/23/2018  Medication Sig   ACCU-CHEK AVIVA PLUS test strip CHECK BLOOD SUGAR FOUR TIMES DAILY   acetaminophen (TYLENOL) 325 MG tablet Take 2 tablets (650 mg total) by mouth every 6 (six) hours as needed for mild pain or headache (or Fever >/= 101).   albuterol (VENTOLIN HFA) 108 (90 Base) MCG/ACT inhaler Inhale 1-2 puffs every 6 hours as needed for wheezing, shortness of breath   amLODipine (NORVASC) 10 MG tablet Take 1 tablet (10 mg total) by mouth daily.   ascorbic acid (VITAMIN C) 500 MG tablet Take 500 mg by mouth daily.   aspirin EC 81 MG tablet Take 1 tablet (81 mg total) by mouth daily with breakfast.   bisacodyl (DULCOLAX) 10 MG suppository Place 1 suppository (10 mg total) rectally daily as needed for mild constipation or moderate constipation.   cetirizine (ZYRTEC) 10 MG tablet Take 1 tablet (10 mg total) by mouth daily.   Cholecalciferol (VITAMIN D3) 5000 units CAPS Take 1 capsule by mouth daily.   clobetasol cream (TEMOVATE) 3.47 % Apply 1 application topically 2 (two) times daily.    diclofenac sodium (VOLTAREN) 1 % GEL APPLY  2 GRAMS  TOPICALLY 4 (FOUR) TIMES DAILY.   insulin aspart (NOVOLOG FLEXPEN) 100 UNIT/ML FlexPen INJECT 10 TO 14 UNITS INTO THE SKIN 3 (THREE) TIMES DAILY WITH MEALS.   Insulin Glargine (TOUJEO SOLOSTAR) 300 UNIT/ML SOPN Inject 20 Units into the skin at bedtime.   lamoTRIgine (LAMICTAL) 100 MG tablet Take 1 tablet (100 mg total) by mouth 2 (two) times daily.   levothyroxine (SYNTHROID) 50 MCG tablet Take 1 tablet (50 mcg total) by mouth daily before breakfast.   LORazepam (ATIVAN) 0.5 MG tablet Take 1 tablet  (0.5 mg total) by mouth 2 (two) times daily.   losartan (COZAAR) 50 MG tablet TAKE 1 TABLET EVERY DAY   metaxalone (SKELAXIN) 800 MG tablet metaxalone 800 mg tablet  take 1 tablet by mouth 3 times daily as needed.   metoprolol tartrate (LOPRESSOR) 50 MG tablet Take 1 tablet (50 mg total) by mouth 2 (two) times daily.   montelukast (SINGULAIR) 10 MG tablet TAKE 1 TABLET EVERY DAY   Multiple Vitamins-Minerals (HM MULTIVITAMIN ADULT GUMMY PO) Take 1 tablet by mouth daily.   nystatin (MYCOSTATIN/NYSTOP) powder Apply to affected areas four times daily as needed.   Omega-3 Fatty Acids (FISH OIL PO) Take 1 capsule by mouth daily.   ondansetron (ZOFRAN) 4 MG tablet Take 1 tablet (4 mg total) by mouth every 8 (eight) hours as needed for nausea or vomiting.   oxyCODONE-acetaminophen (PERCOCET) 10-325 MG tablet Take 1 tablet by mouth every 8 (eight) hours as needed.    PAZEO 0.7 % SOLN Place 1 drop into the right eye 2 (two) times daily.    pregabalin (LYRICA) 75 MG capsule Take 1 capsule (75 mg total) by mouth daily.   RABEprazole (ACIPHEX) 20 MG tablet TAKE 1 TABLET TWICE DAILY   RESTASIS 0.05 % ophthalmic emulsion Place 1 drop into both eyes 2 (two) times daily.    risperiDONE (RISPERDAL) 0.5 MG tablet Take 1 tablet (0.5 mg total) by mouth at bedtime.   rosuvastatin (CRESTOR) 5 MG tablet Take 1 tablet (5 mg total) by mouth at bedtime.   sertraline (ZOLOFT) 100 MG tablet Take 1 tablet (100 mg total) by mouth daily.   Tiotropium Bromide-Olodaterol (STIOLTO RESPIMAT) 2.5-2.5 MCG/ACT AERS Inhale 2 puffs into the lungs daily.   traZODone (DESYREL) 100 MG tablet Take 1 tablet (100 mg total) by mouth at bedtime.   lubiprostone (AMITIZA) 24 MCG capsule Take 1 capsule (24 mcg total) by mouth 2 (two) times daily with a meal.   [EXPIRED] PEG-KCl-NaCl-NaSulf-Na Asc-C (PLENVU) 140 g SOLR Take 1 kit by mouth once for 1 dose.   [DISCONTINUED] triamcinolone ointment (KENALOG) 0.1 % Apply 1  application topically at bedtime. Apply to elbows at bedtime.   No facility-administered encounter medications on file as of 12/23/2018.    Allergies  Allergen Reactions   Cephalexin Hives   Iron Nausea And Vomiting   Milk-Related Compounds Other (See Comments)    Doesn't agree with stomach.    Penicillins Hives    Has patient had a PCN reaction causing immediate rash, facial/tongue/throat swelling, SOB or lightheadedness with hypotension: Yes Has patient had a PCN reaction causing severe rash involving mucus membranes or skin necrosis: No Has patient had a PCN reaction that required hospitalization No Has patient had a PCN reaction occurring within the last 10 years: No If all of the above answers are "NO", then may proceed with Cephalosporin use.    Phenazopyridine Hcl Hives         Patient  Active Problem List   Diagnosis Date Noted   SBO (small bowel obstruction) ([place]) 12/04/2018   Benign neoplasm of cerebral meninges ([place]) 11/12/2018   Benign meningioma of brain ([place]) 10/31/2018   Osteoarthritis of both hips 07/06/2018   Left arm pain 06/11/2018   Lipoma of extremity 12/31/2017   Impingement syndrome of left shoulder region 12/27/2017   Leg weakness, bilateral 10/28/2017   Lumbar spondylosis with myelopathy 10/12/2017   Numbness of hand 10/10/2017   Chronic pain syndrome 07/25/2017   At risk for cardiovascular event 06/10/2017   Rash 04/25/2017   Diabetic polyneuropathy associated with type 2 diabetes mellitus ([place]) 05/19/2016   Tobacco user 04/24/2016   History of palpitations 08/09/2015   Nausea 08/09/2015   Labile hypertension 08/03/2015   Normal coronary arteries 08/03/2015   Left-sided low back pain with left-sided sciatica 06/27/2015   Type 2 diabetes mellitus with hyperglycemia, with long-term current use of insulin ([place]) 05/06/2015   Multinodular goiter 05/06/2015   Rectocele, female 04/27/2015   Anal sphincter incontinence  04/27/2015   Pelvic relaxation due to rectocele 03/30/2015   Pulmonary hypertension ([place]) 02/22/2015   Light cigarette smoker (1-9 cigarettes per day) 01/11/2015   Migraine without aura and without status migrainosus, not intractable 07/02/2014   Flatulence 02/18/2014   Microcytic anemia 02/18/2014   COPD mixed type ([place]) 09/16/2013   Hypothyroidism 08/16/2013   Gastroparesis 04/28/2013   Seizure disorder ([place]) 01/19/2013   Cervical disc disorder with radiculopathy of cervical region 10/31/2012   Solitary pulmonary nodule 08/19/2012   Anemia 07/05/2012   Hypersomnia disorder related to a known organic factor 06/11/2012   Meningioma ([place]) 11/19/2011   Mononeuritis leg 10/25/2011   Carpal tunnel syndrome of right wrist 05/23/2011   Polypharmacy 04/28/2011   Bipolar disorder ([place]) 04/28/2011   Constipation 04/13/2011   Falls frequently 12/12/2010   Oropharyngeal dysphagia 07/12/2010   Urinary incontinence 12/16/2009   HEARING LOSS 10/26/2009   Hyperlipidemia 12/11/2008   IBS 12/11/2008   GERD 07/29/2008   MILK PRODUCTS ALLERGY 07/29/2008   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/03/2007   Essential hypertension 06/27/2007   Backache 06/19/2007   Osteoporosis 06/19/2007   Obstructive sleep apnea 06/19/2007   TRIGGER FINGER 04/18/2007   DIVERTICULOSIS, COLON 11/13/2006   Social History   Socioeconomic History   Marital status: Divorced    Spouse name: Not on file   Number of children: 1   Years of education: 12   Highest education level: Not on file  Occupational History   Occupation: Disabled  Scientist, product/process development strain: Not very hard   Food insecurity    Worry: Never true    Inability: Never true   Transportation needs    Medical: No    Non-medical: No  Tobacco Use   Smoking status: Current Every Day Smoker    Packs/day: 0.50    Years: 30.00    Pack years: 15.00    Types:  Cigarettes   Smokeless tobacco: Never Used   Tobacco comment: 5-10 cigs a day  Substance and Sexual Activity   Alcohol use: No    Alcohol/week: 0.0 standard drinks    Comment:     Drug use: No   Sexual activity: Not Currently  Lifestyle   Physical activity    Days per week: 0 days    Minutes per session: 0 min   Stress: Only a little  Relationships   Social connections    Talks on phone: More than  three times a week    Gets together: Never    Attends religious service: 1 to 4 times per year    Active member of club or organization: No    Attends meetings of clubs or organizations: Never    Relationship status: Divorced   Intimate partner violence    Fear of current or ex partner: No    Emotionally abused: No    Physically abused: No    Forced sexual activity: No  Other Topics Concern   Not on file  Social History Narrative   01/29/18 Lives alone, has 3 aides, Mon- Fri 8 hrs, 2 hrs on Sat-Sun, RN manages her meds   Caffeine use: Drink coffee sometimes    Right handed     Ms. Wurzel's family history includes Alcohol abuse in her maternal uncle; Cancer in her sister and sister; Diabetes in her brother, brother, and father; Heart attack in her maternal grandfather and mother; Hypertension in her brother and son; Kidney failure in her father; Pancreatic cancer in her sister; Pneumonia in her father; Sleep apnea in her son; Stroke in her maternal grandmother.      Objective:    Vitals:   12/23/18 1118  BP: 134/60  Pulse: 72  Temp: 98.4 F (36.9 C)    Physical Exam Well-developed well-nourished chronically ill-appearing older African-American female in no acute distress.   Weight, 174 BMI 35.2 ambulates with difficulty with a cane  HEENT; nontraumatic normocephalic, EOMI, PER R LA, sclera anicteric. Oropharynx; not examined/mask/Covid Neck; supple, no JVD kyphotic Cardiovascular; regular rate and rhythm with S1-S2, no murmur rub or gallop Pulmonary; Clear  bilaterally Abdomen; soft, obese nontender, nondistended, mild fairly generalized abdominal tenderness, no rebound or guarding, abdominal incisional scars no palpable mass or hepatosplenomegaly, bowel sounds are active Rectal; not done today Skin; benign exam, no jaundice rash or appreciable lesions Extremities; no clubbing cyanosis or edema skin warm and dry Neuro/Psych; alert and oriented x4, grossly nonfocal mood and affect appropriate, patient was tearful during visit       Assessment & Plan:  #17 68 year old African-American female with prior history of small bowel obstruction with recent admission to [facility] 12/04/2018 with recurrent small bowel obstruction which resolved with conservative management. Since discharge from the hospital patient continues to be symptomatic with ongoing complaints of discomfort bloating nausea and occasional vomiting she is also been severely obstipated. Repeat CT scan 12/13/2018 with ER visit no evidence of obstruction.  Rule out persistent low-grade obstruction, ileus and/or symptoms secondary to severe obstipation.  #2 status post cholecystectomy hysterectomy and rectocele repair #3 diverticulosis 4.  History of adenomatous colon polyps up-to-date with colonoscopies due for follow-up 2022 #5 history of major depression-active #6 COPD  Plan; plain abdominal films today Encouraged patient to advance her diet a bit with full liquids and very soft foods i.e. cream soups, mashed potatoes, oatmeal etc. continue to push fluids.  She was given a sample of Plenvu bowel prep to purge her bowel. After bowel purge start Amitiza 24 mcg p.o. twice daily-she was given samples today Continue Zofran as needed Continue MiraLAX twice daily as needed Have asked her to call back in a week with update.   Valerian Jewel Genia Harold PA-C 12/24/2018   Cc: [name], MD

## 2018-12-24 NOTE — Progress Notes (Signed)
[place], [place] [address]   CLINIC:  Medical Oncology/Hematology  PCP:  [name], MD [address] [place] [address] [phone]   REASON FOR VISIT: Follow-up for iron deficiency anemia  CURRENT THERAPY: Intermittent iron infusions   INTERVAL HISTORY:  [name] 68 y.o. female returns for routine follow-up for iron deficiency anemia.  She reports she has been feeling more fatigued.  She denies any bright red bleeding per rectum or melena.  She recently saw her GI doctor and had a negative occult stool card. Denies any nausea, vomiting, or diarrhea. Denies any new pains. Had not noticed any recent bleeding such as epistaxis, hematuria or hematochezia. Denies recent chest pain on exertion, shortness of breath on minimal exertion, pre-syncopal episodes, or palpitations. Denies any numbness or tingling in hands or feet. Denies any recent fevers, infections, or recent hospitalizations. Patient reports appetite at 50% and energy level at 50%.  She is eating well maintaining her weight at this time.     REVIEW OF SYSTEMS:  Review of Systems  Constitutional: Positive for fatigue.  Gastrointestinal: Positive for constipation and nausea.  Neurological: Positive for numbness.  All other systems reviewed and are negative.    PAST MEDICAL/SURGICAL HISTORY:  Past Medical History:  Diagnosis Date  . Allergy   . Anemia   . Anxiety    takes Ativan daily  . Arthritis   . Assistance needed for mobility   . Bipolar disorder ([place])    takes Risperdal nightly  . Blood transfusion   . Cancer ([place])    In her gum  . Carpal tunnel syndrome of right wrist 05/23/2011  . Cervical disc disorder with radiculopathy of cervical region 10/31/2012  . Chronic back pain   . Chronic idiopathic constipation   . Chronic neck and back pain   . Colon polyps   . COPD (chronic obstructive pulmonary disease) with chronic bronchitis ([place])  09/16/2013   Office Spirometry 10/30/2013-submaximal effort based on appearance of loop and curve. Numbers would fit with severe restriction but her physiologic capability may be better than this. FVC 0.91/44%, and 10.74/45%, FEV1/FVC 0.81, FEF 25-75% 1.43/69%    . Depression    takes Zoloft daily  . Diabetes mellitus    Type II  . Diverticulosis    TCS 9/08 by Dr. Delfin Edis for diarrhea . Bx for micro scopic colitis negative.   . Fibromyalgia   . Frequent falls   . GERD (gastroesophageal reflux disease)    takes Aciphex daily  . Glaucoma    eye drops daily  . Gum symptoms    infection on antibiotic  . Hemiplegia affecting non-dominant side, post-stroke 08/02/2011  . Hyperlipidemia    takes Crestor daily  . Hypertension    takes Amlodipine,Metoprolol,and Clonidine daily  . Hypothyroidism    takes Synthroid daily  . IBS (irritable bowel syndrome)   . Insomnia    takes Trazodone nightly  . 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 Metabolic encephalopathy [id number]  . Migraines    chronic headaches  . Mononeuritis lower limb   . Narcolepsy   . Osteoporosis   . Pancreatitis 2006   due to Depakote with normal EUS   . Schatzki's ring    non critical / EGD with ED 8/2011with RMR  . Seizures ([place])    takes Lamictal daily.Last seizure 3 yrs ago  . Sleep apnea    on CPAP  . Stroke [facility])  left sided weakness, speech changes  . Tubular adenoma of colon    Past Surgical History:  Procedure Laterality Date  . ABDOMINAL HYSTERECTOMY  1978  . BACK SURGERY  July 2012  . BACTERIAL OVERGROWTH TEST N/A 05/05/2013   Procedure: BACTERIAL OVERGROWTH TEST;  Surgeon: Daneil Dolin, MD;  [facility];  Service: Endoscopy;  Laterality: N/A;  7:30  . BIOPSY THYROID  2009  . BRAIN SURGERY  11/2011   resection of meningioma  . BREAST REDUCTION SURGERY  1994  . CARDIAC CATHETERIZATION  05/10/2005   normal coronaries, normal LV systolic function and EF (Dr. Jackie Plum)  . CARPAL TUNNEL  RELEASE Left 07/22/04   Dr. Aline Brochure  . CATARACT EXTRACTION Bilateral   . CHOLECYSTECTOMY  1984  . COLONOSCOPY N/A 09/25/2012   [id number] diverticulosis.  colonic polyp-removed : tubular adenoma  . CRANIOTOMY  11/23/2011   Procedure: CRANIOTOMY TUMOR EXCISION;  Surgeon: Hosie Spangle, MD;  Location: [facility]URO ORS;  Service: Neurosurgery;  Laterality: N/A;  Craniotomy for tumor resection  . ESOPHAGOGASTRODUODENOSCOPY  12/29/2010   Rourk-Retained food in the esophagus and stomach, small hiatal hernia, status post Maloney dilation of the esophagus  . ESOPHAGOGASTRODUODENOSCOPY N/A 09/25/2012   [id number] atonic baggy esophagus status post Maloney dilation 29 F. Hiatal hernia  . GIVENS CAPSULE STUDY N/A 01/15/2013   NORMAL.   . IR GENERIC HISTORICAL  03/17/2016   IR RADIOLOGIST EVAL & MGMT 03/17/2016 [facility]-INTERV RAD  . LESION REMOVAL N/A 05/31/2015   Procedure: REMOVAL RIGHT AND LEFT LESIONS OF MANDIBLE;  Surgeon: Diona Browner, DDS;  Location: [facility];  Service: Oral Surgery;  Laterality: N/A;  . MALONEY DILATION  12/29/2010   RMR;  . NM MYOCAR PERF WALL MOTION  2006   "relavtiely normal" persantine, mild anterior thinning (breast attenuation artifact), no region of scar/ischemia  . OVARIAN CYST REMOVAL    . RECTOCELE REPAIR N/A 06/29/2015   Procedure: POSTERIOR REPAIR (RECTOCELE);  Surgeon: Jonnie Kind, MD;  Location: [facility];  Service: Gynecology;  Laterality: N/A;  . REDUCTION MAMMAPLASTY Bilateral   . SPINE SURGERY  09/29/2010   Dr. Rolena Infante  . surgical excision of 3 tumors from right thigh and right buttock  and left upper thigh  2010  . TOOTH EXTRACTION Bilateral 12/14/2014   Procedure: REMOVAL OF BILATERAL MANDIBULAR EXOSTOSES;  Surgeon: Diona Browner, DDS;  Location: [facility];  Service: Oral Surgery;  Laterality: Bilateral;  . TRANSTHORACIC ECHOCARDIOGRAM  2010   EF 60-65%, mild conc LVH, grade 1 diastolic dysfunction; mildly calcified MV annulus with mildly thickened leaflets, mildly  calcified MR annulus     SOCIAL HISTORY:  Social History   Socioeconomic History  . Marital status: Divorced    Spouse name: Not on file  . Number of children: 1  . Years of education: 45  . Highest education level: High school graduate  Occupational History  . Occupation: Disabled  Social Needs  . Financial resource strain: Not very hard  . Food insecurity    Worry: Never true    Inability: Never true  . Transportation needs    Medical: No    Non-medical: No  Tobacco Use  . Smoking status: Current Every Day Smoker    Packs/day: 0.50    Years: 30.00    Pack years: 15.00    Types: Cigarettes  . Smokeless tobacco: Never Used  . Tobacco comment: 5-10 cigs a day  Substance and Sexual Activity  . Alcohol use: No  Alcohol/week: 0.0 standard drinks    Comment:    . Drug use: No  . Sexual activity: Not Currently  Lifestyle  . Physical activity    Days per week: 0 days    Minutes per session: 0 min  . Stress: Only a little  Relationships  . Social connections    Talks on phone: More than three times a week    Gets together: Never    Attends religious service: 1 to 4 times per year    Active member of club or organization: No    Attends meetings of clubs or organizations: Never    Relationship status: Divorced  . Intimate partner violence    Fear of current or ex partner: No    Emotionally abused: No    Physically abused: No    Forced sexual activity: No  Other Topics Concern  . Not on file  Social History Narrative   01/29/18 Lives alone, has 3 aides, Mon- Fri 8 hrs, 2 hrs on Sat-Sun, RN manages her meds   Caffeine use: Drink coffee sometimes    Right handed     FAMILY HISTORY:  Family History  Problem Relation Age of Onset  . Heart attack Mother        HTN  . Pneumonia Father   . Kidney failure Father   . Diabetes Father   . Pancreatic cancer Sister   . Diabetes Brother   . Hypertension Brother   . Diabetes Brother   . Cancer Sister        breast    . Hypertension Son   . Sleep apnea Son   . Cancer Sister        pancreatic  . Stroke Maternal Grandmother   . Heart attack Maternal Grandfather   . Alcohol abuse Maternal Uncle   . Colon cancer Neg Hx   . Anesthesia problems Neg Hx   . Hypotension Neg Hx   . Malignant hyperthermia Neg Hx   . Pseudochol deficiency Neg Hx   . Breast cancer Neg Hx     CURRENT MEDICATIONS:  Outpatient Encounter Medications as of 12/24/2018  Medication Sig  . ACCU-CHEK AVIVA PLUS test strip CHECK BLOOD SUGAR FOUR TIMES DAILY  . acetaminophen (TYLENOL) 325 MG tablet Take 2 tablets (650 mg total) by mouth every 6 (six) hours as needed for mild pain or headache (or Fever >/= 101).  [name] albuterol (VENTOLIN HFA) 108 (90 Base) MCG/ACT inhaler Inhale 1-2 puffs every 6 hours as needed for wheezing, shortness of breath  . amLODipine (NORVASC) 10 MG tablet Take 1 tablet (10 mg total) by mouth daily.  [name] ascorbic acid (VITAMIN C) 500 MG tablet Take 500 mg by mouth daily.  [name] aspirin EC 81 MG tablet Take 1 tablet (81 mg total) by mouth daily with breakfast.  . bisacodyl (DULCOLAX) 10 MG suppository Place 1 suppository (10 mg total) rectally daily as needed for mild constipation or moderate constipation.  . cetirizine (ZYRTEC) 10 MG tablet Take 1 tablet (10 mg total) by mouth daily.  . Cholecalciferol (VITAMIN D3) 5000 units CAPS Take 1 capsule by mouth daily.  . clobetasol cream (TEMOVATE) [id number] % Apply 1 application topically 2 (two) times daily.   . diclofenac sodium (VOLTAREN) 1 % GEL APPLY 2 GRAMS  TOPICALLY 4 (FOUR) TIMES DAILY.  [name] insulin aspart (NOVOLOG FLEXPEN) 100 UNIT/ML FlexPen INJECT 10 TO 14 UNITS INTO THE SKIN 3 (THREE) TIMES DAILY WITH MEALS.  [name] Insulin Glargine (TOUJEO SOLOSTAR) 300 UNIT/ML  SOPN Inject 20 Units into the skin at bedtime.  . lamoTRIgine (LAMICTAL) 100 MG tablet Take 1 tablet (100 mg total) by mouth 2 (two) times daily.  [name] levothyroxine (SYNTHROID) 50 MCG tablet Take 1 tablet (50 mcg total)  by mouth daily before breakfast.  . LORazepam (ATIVAN) 0.5 MG tablet Take 1 tablet (0.5 mg total) by mouth 2 (two) times daily.  [name] losartan (COZAAR) 50 MG tablet TAKE 1 TABLET EVERY DAY  . lubiprostone (AMITIZA) 24 MCG capsule Take 1 capsule (24 mcg total) by mouth 2 (two) times daily with a meal.  . metaxalone (SKELAXIN) 800 MG tablet metaxalone 800 mg tablet  take 1 tablet by mouth 3 times daily as needed.  . metoprolol tartrate (LOPRESSOR) 50 MG tablet Take 1 tablet (50 mg total) by mouth 2 (two) times daily.  . montelukast (SINGULAIR) 10 MG tablet TAKE 1 TABLET EVERY DAY  . Multiple Vitamins-Minerals (HM MULTIVITAMIN ADULT GUMMY PO) Take 1 tablet by mouth daily.  [name] nystatin (MYCOSTATIN/NYSTOP) powder Apply to affected areas four times daily as needed.  . Omega-3 Fatty Acids (FISH OIL PO) Take 1 capsule by mouth daily.  . ondansetron (ZOFRAN) 4 MG tablet Take 1 tablet (4 mg total) by mouth every 8 (eight) hours as needed for nausea or vomiting.  [name] oxyCODONE-acetaminophen (PERCOCET) 10-325 MG tablet Take 1 tablet by mouth every 8 (eight) hours as needed.   [name] PAZEO 0.7 % SOLN Place 1 drop into the right eye 2 (two) times daily.   . pregabalin (LYRICA) 75 MG capsule Take 1 capsule (75 mg total) by mouth daily.  . RABEprazole (ACIPHEX) 20 MG tablet TAKE 1 TABLET TWICE DAILY  . RESTASIS 0.05 % ophthalmic emulsion Place 1 drop into both eyes 2 (two) times daily.   . risperiDONE (RISPERDAL) 0.5 MG tablet Take 1 tablet (0.5 mg total) by mouth at bedtime.  . rosuvastatin (CRESTOR) 5 MG tablet Take 1 tablet (5 mg total) by mouth at bedtime.  . sertraline (ZOLOFT) 100 MG tablet Take 1 tablet (100 mg total) by mouth daily.  . Tiotropium Bromide-Olodaterol (STIOLTO RESPIMAT) 2.5-2.5 MCG/ACT AERS Inhale 2 puffs into the lungs daily.  . traZODone (DESYREL) 100 MG tablet Take 1 tablet (100 mg total) by mouth at bedtime.   No facility-administered encounter medications on file as of 12/24/2018.      ALLERGIES:  Allergies  Allergen Reactions  . Cephalexin Hives  . Iron Nausea And Vomiting  . Milk-Related Compounds Other (See Comments)    Doesn't agree with stomach.   . Penicillins Hives    Has patient had a PCN reaction causing immediate rash, facial/tongue/throat swelling, SOB or lightheadedness with hypotension: Yes Has patient had a PCN reaction causing severe rash involving mucus membranes or skin necrosis: No Has patient had a PCN reaction that required hospitalization No Has patient had a PCN reaction occurring within the last 10 years: No If all of the above answers are "NO", then may proceed with Cephalosporin use.   [name] Phenazopyridine Hcl Hives           PHYSICAL EXAM:  ECOG Performance status: 1  Vitals:   12/24/18 1100  BP: (!) 156/67  Pulse: 75  Resp: 16  Temp: 97.9 F (36.6 C)  SpO2: 100%   Filed Weights   12/24/18 1100  Weight: 173 lb (78.5 kg)    Physical Exam Constitutional:      Appearance: Normal appearance. She is normal weight.  Cardiovascular:     Rate and  Rhythm: Normal rate and regular rhythm.     Heart sounds: Normal heart sounds.  Pulmonary:     Effort: Pulmonary effort is normal.     Breath sounds: Normal breath sounds.  Abdominal:     General: Bowel sounds are normal.     Palpations: Abdomen is soft.  Musculoskeletal: Normal range of motion.  Skin:    General: Skin is warm.  Neurological:     Mental Status: She is alert and oriented to person, place, and time. Mental status is at baseline.  Psychiatric:        Mood and Affect: Mood normal.        Behavior: Behavior normal.        Thought Content: Thought content normal.        Judgment: Judgment normal.      LABORATORY DATA:  I have reviewed the labs as listed.  CBC    Component Value Date/Time   WBC 5.3 12/13/2018 1347   RBC 3.89 12/13/2018 1347   HGB 8.7 (L) 12/13/2018 1347   HGB 10.3 (L) 01/29/2018 1315   HCT 31.1 (L) 12/13/2018 1347   HCT 33.8 (L) 01/29/2018  1315   PLT 216 12/13/2018 1347   PLT 275 01/29/2018 1315   MCV 79.9 (L) 12/13/2018 1347   MCV 75 (L) 01/29/2018 1315   MCH 22.4 (L) 12/13/2018 1347   MCHC 28.0 (L) 12/13/2018 1347   RDW 15.5 12/13/2018 1347   RDW 12.8 01/29/2018 1315   LYMPHSABS 1.3 12/13/2018 1347   LYMPHSABS 2.4 01/29/2018 1315   MONOABS 0.5 12/13/2018 1347   EOSABS 0.2 12/13/2018 1347   EOSABS 0.2 01/29/2018 1315   BASOSABS 0.0 12/13/2018 1347   BASOSABS 0.0 01/29/2018 1315   CMP Latest Ref Rng & Units 12/13/2018 12/13/2018 12/08/2018  Glucose 70 - 99 mg/dL 174(H) 172(H) 123(H)  BUN 8 - 23 mg/dL 17 17 7(L)  Creatinine 0.44 - 1.00 mg/dL 0.97 0.96 0.74  Sodium 135 - 145 mmol/L 142 144 140  Potassium 3.5 - 5.1 mmol/L 3.3(L) 3.3(L) 3.3(L)  Chloride 98 - 111 mmol/L 110 111 109  CO2 22 - 32 mmol/L 24 24 24   Calcium 8.9 - 10.3 mg/dL 8.5(L) 8.7(L) 8.6(L)  Total Protein 6.5 - 8.1 g/dL 6.4(L) 6.7 -  Total Bilirubin 0.3 - 1.2 mg/dL 0.3 0.2(L) -  Alkaline Phos 38 - 126 U/L 80 84 -  AST 15 - 41 U/L 23 24 -  ALT 0 - 44 U/L 23 26 -   I personally performed a face-to-face visit.  All questions were answered to patient's stated satisfaction. Encouraged patient to call with any new concerns or questions before his next visit to the cancer center and we can certain see him sooner, if needed.     ASSESSMENT & PLAN:   Microcytic anemia 1.  Microcytic anemia: -This is a combination anemia from iron deficiency and chronic kidney disease. -Colonoscopy on 02/10/2016 showing 2 sessile polyps in the descending colon, diverticula in the sigmoid colon. -EGD done on 02/10/2016 showing mild to moderate esophagitis, erythema in the inferior gastric antrum. - She was placed on iron pills, but could not tolerate them due to constipation. -She denies any bright red bleeding per rectum or melena. - She last received IV Feraheme x2 doses in January 2020. -Labs done on 12/13/2018 showed hemoglobin 8.9, ferritin 68, percent saturation 9,  platelets 201. -I have recommended to her get 2 more infusions of iron.  She reports she had nausea and itching after  last treatment we will premedicate her prior. -She will follow-up in 3 months with repeat labs.      Orders placed this encounter:  Orders Placed This Encounter  Procedures  . Lactate dehydrogenase  . CBC with Differential/Platelet  . Comprehensive metabolic panel  . Ferritin  . Iron and TIBC  . Vitamin B12  . VITAMIN D 25 Hydroxy (Vit-D Deficiency, Fractures)  . Folate      Francene Finders, FNP-C [place] [phone]

## 2018-12-24 NOTE — Telephone Encounter (Signed)
Pt stated that she is returning a call regarding lab results.

## 2018-12-24 NOTE — Patient Instructions (Signed)
[facility] at [facility] Discharge Instructions  Follow up in 3 months with lab s   Thank you for choosing [facility] at [facility] to provide your oncology and hematology care.  To afford each patient quality time with our provider, please arrive at least 15 minutes before your scheduled appointment time.   If you have a lab appointment with the Cancer Center please come in thru the Main Entrance and check in at the main information desk.  You need to re-schedule your appointment should you arrive 10 or more minutes late.  We strive to give you quality time with our providers, and arriving late affects you and other patients whose appointments are after yours.  Also, if you no show three or more times for appointments you may be dismissed from the clinic at the providers discretion.     Again, thank you for choosing [facility].  Our hope is that these requests will decrease the amount of time that you wait before being seen by our physicians.       _____________________________________________________________  Should you have questions after your visit to [facility], please contact our office at [phone] between the hours of 8:00 a.m. and 4:30 p.m.  Voicemails left after 4:00 p.m. will not be returned until the following business day.  For prescription refill requests, have your pharmacy contact our office and allow 72 hours.    Due to Covid, you will need to wear a mask upon entering the hospital. If you do not have a mask, a mask will be given to you at the Main Entrance upon arrival. For doctor visits, patients may have 1 support person with them. For treatment visits, patients can not have anyone with them due to social distancing guidelines and our immunocompromised population.      

## 2018-12-24 NOTE — Patient Outreach (Signed)
[facility]) Care Management  12/24/2018  Orla Estrin Lalani April 02, 1950 [id number]  CSW was able to make initial contact with patient today to perform phone assessment, as well as assess and assist with social work needs and services.  CSW introduced self, explained role and types of services provided through [facility] ([facility]).  CSW further explained to patient that CSW works with patient's Telephonic RNCM, also with [facility], Sonda Rumble.  CSW then explained the reason for the call, indicating that Mrs. Burt Knack thought that patient would benefit from social work services and resources to assist with completion of Advanced Directives ([facility] documents), as well as provide counseling and supportive services for symptoms of depression.  CSW obtained two HIPAA compliant identifiers from patient, which included patient's name and date of birth. Patient reported that she already has the Advanced Directives packet, she just needs assistance with completion, and is willing to try and complete the documents over the phone.  CSW was planning to try and complete the forms with patient today; however, patient needed to terminate the call to attend an appointment with Francene Finders, Nurse Practitioner at the [facility], at [place] explained to patient that [place] would be referring patient to [employer], social work Social worker, also with [facility], to assist patient with completion of the documents.  Patient voiced understanding, reporting that she would have the documents readily available at the time of Mrs. Chrismon's call.  Patient went on to explain that she will go ahead and try to fill out the documents as much as possible, as not to take up to much of Mrs. Chrismon's time. CSW discussed patient's Depression Screening (PHQ-2 & PHQ-9) results with her at  length and was willing to offer services and resources; however, patient admitted that she is already established with psychotherapeutic care, and has been for several years.  Patient reported that she has a history of depression, anxiety and bipolar disorder, and is currently taking Lamictal 100 MG, PO, Twice Daily, Ativan 0.5 MG, PO, Twice Daily, Risperdal 0.5 MG, PO, Daily, Zoloft 100 MG, PO, Daily and Desyrel 100 MG, PO, Daily.  Patient indicated that her Primary Care Physician, Dr. Tula Nakayama is already aware of her diagnoses, symptoms and medication regimens and monitors her conditions closely.  Patient was not interested in having CSW offer counseling services, nor was patient able to identify any additional social work needs at present.  Patient denied having food insecurities, needing assistance with transportation, or wanting a list of financial resources. Patient reported that she is already receiving ongoing counseling and supportive services through [facility] at [place].  Patient confirmed that she see's her Licensed Clinical Social Worker, [employer], on a monthly basis, for counseling and psychotherapeutic services.  Patient's next appointment with Mrs. Bynum is scheduled for Thursday, January 09, 2019, at 9:00AM.  Patient went on to explain that she see's Dr. Levonne Spiller, Psychiatrist, for medication management, every three months, and that her next appointment with Dr. Harrington Challenger is scheduled for Wednesday, March 12, 2019, at 10:40AM.  Patient indicated that she is very pleased with the psychotherapeutic services that she receives at [facility] at [place], and that "the staff do a wonderful job of managing symptoms and maintaining mental health well-being".   Patient admitted to experiencing a great deal of pain today, which included all of  the following:  bulging cervical disc in neck due to cervical disc disorder with  radiculopathy of cervical region, hole in rotary cuff and nerve damage in left arm, left-sided low back pain with left-sided sciatica and lumbar spondylosis with myelopathy, migraines from benign meningioma of brain and benign neoplasm of cerebral meninges, abdominal pain from small bowel obstruction and irritable bowel syndrome and osteoarthritis of both hips.  Patient indicated that she has already had one back surgery, but that she refuses to "go under the knife again", because she was told that she would require 4 weeks of rehab after the surgery and that she may potentially be left paralyzed.  Patient reported having a tumor on her brain in 2013, which was surgically removed, but was recently diagnosed with another brain tumor, for which she receives radiation treatments.   CSW will perform a case closure on patient, as all goals of treatment have been met from social work standpoint and no additional social work needs have been identified at this time.  CSW will notify patient's Telephonic RNCM, Sonda Rumble, as well as patient's [place], both with [facility], of CSW's plans to close patient's case.  CSW will fax an update to patient's Primary Care Physician, Dr. Tula Nakayama to ensure that they are aware of CSW's involvement with patient's plan of care.    CSW was able to confirm that patient has the correct contact information for CSW, encouraging patient to contact CSW directly if additional social work needs arise in the near future.  Patient is aware that she will be receiving a call from Mrs. Chrismon, to assist her with completing her Advanced Directives, within the next 10 business days. [name], BSW, MSW, LCSW  Licensed Education officer, [facility]  Mailing [place] [address] [place], [place] [address] Physical Address-[address], [place], [place] [address] Toll Free  Main # [phone] Fax # [phone] Cell # [phone]  Office # [phone] [name]

## 2018-12-25 ENCOUNTER — Other Ambulatory Visit: Payer: Self-pay | Admitting: Family Medicine

## 2018-12-25 ENCOUNTER — Other Ambulatory Visit: Payer: Self-pay

## 2018-12-25 ENCOUNTER — Other Ambulatory Visit: Payer: Self-pay | Admitting: Cardiovascular Disease

## 2018-12-25 ENCOUNTER — Ambulatory Visit: Payer: Medicare HMO

## 2018-12-25 DIAGNOSIS — K56699 Other intestinal obstruction unspecified as to partial versus complete obstruction: Secondary | ICD-10-CM | POA: Diagnosis not present

## 2018-12-25 DIAGNOSIS — D320 Benign neoplasm of cerebral meninges: Secondary | ICD-10-CM | POA: Diagnosis not present

## 2018-12-25 DIAGNOSIS — R140 Abdominal distension (gaseous): Secondary | ICD-10-CM | POA: Diagnosis not present

## 2018-12-25 DIAGNOSIS — J449 Chronic obstructive pulmonary disease, unspecified: Secondary | ICD-10-CM | POA: Diagnosis not present

## 2018-12-25 DIAGNOSIS — M159 Polyosteoarthritis, unspecified: Secondary | ICD-10-CM | POA: Diagnosis not present

## 2018-12-25 DIAGNOSIS — E1142 Type 2 diabetes mellitus with diabetic polyneuropathy: Secondary | ICD-10-CM | POA: Diagnosis not present

## 2018-12-25 DIAGNOSIS — M5030 Other cervical disc degeneration, unspecified cervical region: Secondary | ICD-10-CM | POA: Diagnosis not present

## 2018-12-25 DIAGNOSIS — M7542 Impingement syndrome of left shoulder: Secondary | ICD-10-CM | POA: Diagnosis not present

## 2018-12-25 DIAGNOSIS — I69354 Hemiplegia and hemiparesis following cerebral infarction affecting left non-dominant side: Secondary | ICD-10-CM | POA: Diagnosis not present

## 2018-12-25 NOTE — Patient Outreach (Signed)
[place] [facility]) Care Management  12/25/2018  [name] May 13, 1950 [id number]   Request received from [place], [name], to outreach patient for assistance with completion of Advance Directives.  Successful outreach to patient today but she requested a call back when she is better prepared to review documents. Scheduled call with patient for Monday, 12/30/18.  Ronn Melena, BSW Social Worker [phone]

## 2018-12-25 NOTE — Telephone Encounter (Signed)
Patient's Amitiza has been approved thru 03/12/2020. Pharmacy informed.

## 2018-12-26 ENCOUNTER — Other Ambulatory Visit: Payer: Self-pay

## 2018-12-26 NOTE — Patient Outreach (Signed)
[place] [facility]) Care Management  12/26/2018  [name] 05-16-1950 [id number]   Telephone call to patient.  She reports seeing GI doctor and states she was given medication for constipation.  She is not sure of the name but states it is working. She does say that she still has some abdominal pain and she is to call GI doctor next week to inform of her status. Discussed with patient if she continues to have abdominal pain and she feels it is not getting better to let the MD know.  She verbalized understanding.  Patient states that her breathing is ok but worse when she does things. Discussed COPD and signs of worsening.  She verbalized understanding.  Patient states that her iron if low and that she will receive iron infusion next week to help.  Discussed iron infusion and how it will benefit her.  She verbalized understanding.  [facility] nurse [name] continues to fill patient medication box per patient.  Patient denies any questions concerns or needs.    Plan: RN CM will contact patient next week and patient agreeable.    Jone Baseman, RN, MSN [place] Management Care Management Coordinator Direct Line [phone] Cell [phone] Toll Free: [phone]  Fax: [phone]

## 2018-12-27 NOTE — Progress Notes (Signed)
Addendum: Reviewed and agree with assessment and management plan. Pyrtle, Jay M, MD  

## 2018-12-28 ENCOUNTER — Telehealth: Payer: Self-pay | Admitting: Internal Medicine

## 2018-12-28 DIAGNOSIS — E785 Hyperlipidemia, unspecified: Secondary | ICD-10-CM | POA: Diagnosis not present

## 2018-12-28 DIAGNOSIS — G4733 Obstructive sleep apnea (adult) (pediatric): Secondary | ICD-10-CM | POA: Diagnosis not present

## 2018-12-28 NOTE — Telephone Encounter (Signed)
The patient called me yesterday around 5:30 PM. She was seen by Nicoletta Ba, PA-C earlier in the week with obstipation.  She has a longstanding history of constipation.  It should also be noted that she has pelvic floor laxity/dysfunction along with a rectocele, cystocele and mild vaginal cuff prolapse.  This was seen by dynamic pelvic MRI in December 2018.  At that time there was poor emptying of the rectocele and rectum during evacuation.  It is very likely that her chronic constipation is exacerbated by her resting and dynamic pelvic floor laxity along with the anatomic abnormality in her cystocele and rectocele.  This makes it very difficult for her to evacuate her rectum.  She reports she had very good result and felt relief with the Plenvu bowel purge but no bowel movement since Monday into Tuesday and she is again feeling very uncomfortable.  She is asking what to do.  I instructed her to use the MiraLAX she has at home to do a MiraLAX bowel purge.  Mix 8 doses of MiraLAX with 32 ounces of water or Gatorade.  Drink this over 2 to 3 hours to induce bowel movement.  She was given Amitiza which I recommended she continue 24 mcg twice daily but this dose alone was not able to produce bowel movement.  Please add Linzess 145 mcg daily.  Instruct her that she can take Amitiza 24 mcg twice daily and Linzess 145 mcg once daily in the morning.  If we cannot have reliable bowel movements with medical therapy then she may benefit from referral to colorectal surgery or urogynecology to consider repair of her pelvic floor/rectocele.  Please have her follow-up with me or  Amy Esterwood, in the next couple weeks

## 2018-12-30 ENCOUNTER — Other Ambulatory Visit: Payer: Self-pay

## 2018-12-30 DIAGNOSIS — M159 Polyosteoarthritis, unspecified: Secondary | ICD-10-CM | POA: Diagnosis not present

## 2018-12-30 DIAGNOSIS — E1142 Type 2 diabetes mellitus with diabetic polyneuropathy: Secondary | ICD-10-CM | POA: Diagnosis not present

## 2018-12-30 DIAGNOSIS — D320 Benign neoplasm of cerebral meninges: Secondary | ICD-10-CM | POA: Diagnosis not present

## 2018-12-30 DIAGNOSIS — J449 Chronic obstructive pulmonary disease, unspecified: Secondary | ICD-10-CM | POA: Diagnosis not present

## 2018-12-30 DIAGNOSIS — M5030 Other cervical disc degeneration, unspecified cervical region: Secondary | ICD-10-CM | POA: Diagnosis not present

## 2018-12-30 DIAGNOSIS — M7542 Impingement syndrome of left shoulder: Secondary | ICD-10-CM | POA: Diagnosis not present

## 2018-12-30 DIAGNOSIS — I69354 Hemiplegia and hemiparesis following cerebral infarction affecting left non-dominant side: Secondary | ICD-10-CM | POA: Diagnosis not present

## 2018-12-30 DIAGNOSIS — R140 Abdominal distension (gaseous): Secondary | ICD-10-CM | POA: Diagnosis not present

## 2018-12-30 DIAGNOSIS — K56699 Other intestinal obstruction unspecified as to partial versus complete obstruction: Secondary | ICD-10-CM | POA: Diagnosis not present

## 2018-12-30 MED ORDER — LINACLOTIDE 145 MCG PO CAPS: 145.0000 ug | ORAL_CAPSULE | Freq: Every day | ORAL | 6 refills | Status: DC

## 2018-12-30 NOTE — Telephone Encounter (Signed)
appt made to follow up with Dr Hilarie Fredrickson on 01/22/19 at 1010 am

## 2018-12-30 NOTE — Patient Outreach (Signed)
[place] [facility]) Care Management  12/30/2018  [name] [birth year] [id number]  Successful follow up call to patient to assist with completion of Advance Directives.  Attempted to assist patient via phone as home visits are not being scheduled at this time.  Patient stated that she is confused about documentation.  Inquired about her son's availability to be on a future call as she intends for him to be [facility] POA.  Per patient, son has a challenging work schedule; sometimes working 7 days per week and often until 10:00 PM. Messaged Jeanella Craze to inquire about assistance that patient can receive if she contacts one of the numbers listed on back of Advance Directive packet. Will follow up with patient with response is received.    Ronn Melena, BSW Social Worker [phone]

## 2018-12-30 NOTE — Telephone Encounter (Signed)
linzess was added to the medication list and the pt aware to take with amitiza and keep appt with Dr Hilarie Fredrickson. appt made to follow up with Dr Hilarie Fredrickson on 01/22/19 at 1010 am The pt has been advised of the information and verbalized understanding.

## 2018-12-30 NOTE — Telephone Encounter (Signed)
Thank you Dr Hilarie Fredrickson .Marland KitchenMarland Kitchen

## 2018-12-31 ENCOUNTER — Inpatient Hospital Stay (HOSPITAL_COMMUNITY): Payer: Medicare HMO

## 2018-12-31 ENCOUNTER — Other Ambulatory Visit: Payer: Self-pay

## 2018-12-31 ENCOUNTER — Encounter (HOSPITAL_COMMUNITY): Payer: Self-pay

## 2018-12-31 VITALS — BP 118/62 | HR 62 | Temp 96.7°F | Resp 18

## 2018-12-31 DIAGNOSIS — M797 Fibromyalgia: Secondary | ICD-10-CM | POA: Diagnosis not present

## 2018-12-31 DIAGNOSIS — F319 Bipolar disorder, unspecified: Secondary | ICD-10-CM | POA: Diagnosis not present

## 2018-12-31 DIAGNOSIS — J449 Chronic obstructive pulmonary disease, unspecified: Secondary | ICD-10-CM | POA: Diagnosis not present

## 2018-12-31 DIAGNOSIS — Z79899 Other long term (current) drug therapy: Secondary | ICD-10-CM | POA: Diagnosis not present

## 2018-12-31 DIAGNOSIS — R5383 Other fatigue: Secondary | ICD-10-CM | POA: Diagnosis not present

## 2018-12-31 DIAGNOSIS — R110 Nausea: Secondary | ICD-10-CM | POA: Diagnosis not present

## 2018-12-31 DIAGNOSIS — D509 Iron deficiency anemia, unspecified: Secondary | ICD-10-CM | POA: Diagnosis not present

## 2018-12-31 DIAGNOSIS — K5900 Constipation, unspecified: Secondary | ICD-10-CM | POA: Diagnosis not present

## 2018-12-31 DIAGNOSIS — E119 Type 2 diabetes mellitus without complications: Secondary | ICD-10-CM | POA: Diagnosis not present

## 2018-12-31 MED ORDER — SODIUM CHLORIDE 0.9 % IV SOLN
510.0000 mg | Freq: Once | INTRAVENOUS | Status: AC
  Administered 2018-12-31: 15:00:00 510 mg via INTRAVENOUS
  Filled 2018-12-31: qty 510

## 2018-12-31 MED ORDER — DIPHENHYDRAMINE HCL 50 MG/ML IJ SOLN
25.0000 mg | Freq: Once | INTRAMUSCULAR | Status: AC
  Administered 2018-12-31: 25 mg via INTRAVENOUS
  Filled 2018-12-31: qty 1

## 2018-12-31 MED ORDER — SODIUM CHLORIDE 0.9 % IV SOLN
INTRAVENOUS | Status: DC
  Administered 2018-12-31: 15:00:00 via INTRAVENOUS

## 2018-12-31 MED ORDER — ONDANSETRON HCL 4 MG/2ML IJ SOLN
4.0000 mg | Freq: Once | INTRAMUSCULAR | Status: AC
  Administered 2018-12-31: 4 mg via INTRAVENOUS
  Filled 2018-12-31: qty 2

## 2018-12-31 NOTE — Patient Instructions (Signed)
[facility]  Discharge Instructions:   _______________________________________________________________  Thank you for choosing [facility] at [facility] to provide your oncology and hematology care.  To afford each patient quality time with our providers, please arrive at least 15 minutes before your scheduled appointment.  You need to re-schedule your appointment if you arrive 10 or more minutes late.  We strive to give you quality time with our providers, and arriving late affects you and other patients whose appointments are after yours.  Also, if you no show three or more times for appointments you may be dismissed from the clinic.  Again, thank you for choosing [facility] at [facility]. Our hope is that these requests will allow you access to exceptional care and in a timely manner. _______________________________________________________________  If you have questions after your visit, please contact our office at [phone] between the hours of 8:30 a.m. and 5:00 p.m. Voicemails left after 4:30 p.m. will not be returned until the following business day. _______________________________________________________________  For prescription refill requests, have your pharmacy contact our office. _______________________________________________________________  Recommendations made by the consultant and any test results will be sent to your referring physician. _______________________________________________________________ 

## 2018-12-31 NOTE — Progress Notes (Unsigned)
Iron infusion per orders. Patient tolerated it well without problems. Vitals stable and discharged home from clinic ambulatory with walker. Follow up as scheduled.

## 2018-12-31 NOTE — Patient Outreach (Signed)
[place] [facility]) Care Management  12/31/2018  Noha Shipps [place] 1950-06-29 [id number]   Received response from Jeanella Craze regarding assistance with completion of Advance Directives for patient.  He asked that patient be provided with his assistants contact information so that a time can be scheduled for him to talk with her. Called patient and provided her with contact information for Yvone Neu.  Patient was appreciative of assistance.  Closing social work case at this time but did encourage her to call if additional needs arise.    Ronn Melena, BSW Social Worker [phone]

## 2019-01-01 ENCOUNTER — Encounter: Payer: Self-pay | Admitting: Family Medicine

## 2019-01-01 ENCOUNTER — Other Ambulatory Visit: Payer: Self-pay

## 2019-01-01 ENCOUNTER — Ambulatory Visit (INDEPENDENT_AMBULATORY_CARE_PROVIDER_SITE_OTHER): Payer: Medicare HMO | Admitting: Family Medicine

## 2019-01-01 VITALS — BP 124/66 | HR 64 | Temp 97.6°F | Resp 15 | Ht 59.0 in | Wt 170.1 lb

## 2019-01-01 DIAGNOSIS — J449 Chronic obstructive pulmonary disease, unspecified: Secondary | ICD-10-CM

## 2019-01-01 DIAGNOSIS — M5010 Cervical disc disorder with radiculopathy, unspecified cervical region: Secondary | ICD-10-CM | POA: Diagnosis not present

## 2019-01-01 DIAGNOSIS — F3170 Bipolar disorder, currently in remission, most recent episode unspecified: Secondary | ICD-10-CM | POA: Diagnosis not present

## 2019-01-01 DIAGNOSIS — Z23 Encounter for immunization: Secondary | ICD-10-CM | POA: Diagnosis not present

## 2019-01-01 DIAGNOSIS — E039 Hypothyroidism, unspecified: Secondary | ICD-10-CM

## 2019-01-01 DIAGNOSIS — F17210 Nicotine dependence, cigarettes, uncomplicated: Secondary | ICD-10-CM

## 2019-01-01 DIAGNOSIS — I10 Essential (primary) hypertension: Secondary | ICD-10-CM | POA: Diagnosis not present

## 2019-01-01 DIAGNOSIS — Z780 Asymptomatic menopausal state: Secondary | ICD-10-CM

## 2019-01-01 DIAGNOSIS — F319 Bipolar disorder, unspecified: Secondary | ICD-10-CM | POA: Diagnosis not present

## 2019-01-01 DIAGNOSIS — E1165 Type 2 diabetes mellitus with hyperglycemia: Secondary | ICD-10-CM

## 2019-01-01 DIAGNOSIS — Z794 Long term (current) use of insulin: Secondary | ICD-10-CM | POA: Diagnosis not present

## 2019-01-01 MED ORDER — KETOROLAC TROMETHAMINE 60 MG/2ML IM SOLN
60.0000 mg | Freq: Once | INTRAMUSCULAR | Status: AC
  Administered 2019-01-01: 60 mg via INTRAMUSCULAR

## 2019-01-01 NOTE — Patient Outreach (Signed)
[place] [facility]) Care Management  01/01/2019  [name] 1950/07/15 [id number]   Telephone call to patient.  Patient not making sense but aide was present and patient allowed aide Enid Derry to speak with CM.  Spoke with Enid Derry she states that patient was asleep and that sometimes patient after a busy day needs to sleep a little longer the next day.  She states that patient had a busy day yesterday with appointments and advised that CM call back later.    Plan: RN CM will call patient later.   Update: Telephone call back to patient.  She is awake.  Patient reports that is doing ok .  Completed her iron infusion yesterday and will have another next week. Patient reports some cramping to her stomach and that she is taking linzess and amitiza.  Patient states without the medications she has to deal with constipation.  Patient states she has a sister who had colon cancer and that the GI doctor mentioned doing a colonoscopy.  She states she follows up with him on 01-22-19.  Encouraged patient to discuss with GI doctor given her family history.  She verbalized understanding.  Patient states that she sees [name] today and will possibly get her flu vaccine.  Encouraged patient to get her flu vaccine. She verbalized no concerns with her COPD.  Discussed COPD status and when to seek medical attention. She verbalized understanding and voices no other concerns.    Plan: RN CM will contact patient next week and patient agreeable.    Jone Baseman, RN, MSN [place] Management Care Management Coordinator Direct Line [phone] Cell [phone] Toll Free: [phone]  Fax: [phone]

## 2019-01-01 NOTE — Patient Instructions (Addendum)
Keep f/u appointment , call if you need me sooner Please schedule bone density test  Flu vaccine today  Excellent blood pressure  Need shingrix #1 and shingrix #2, check your pharmacy for these in Novemebr  Good foot exam but you need toenails cut, please check your feet  Since you are at 3 cigarettes/ day , call [place] so you quit  Handicap sticker today

## 2019-01-02 DIAGNOSIS — I69354 Hemiplegia and hemiparesis following cerebral infarction affecting left non-dominant side: Secondary | ICD-10-CM | POA: Diagnosis not present

## 2019-01-02 DIAGNOSIS — M5030 Other cervical disc degeneration, unspecified cervical region: Secondary | ICD-10-CM | POA: Diagnosis not present

## 2019-01-02 DIAGNOSIS — D320 Benign neoplasm of cerebral meninges: Secondary | ICD-10-CM | POA: Diagnosis not present

## 2019-01-02 DIAGNOSIS — R140 Abdominal distension (gaseous): Secondary | ICD-10-CM | POA: Diagnosis not present

## 2019-01-02 DIAGNOSIS — M7542 Impingement syndrome of left shoulder: Secondary | ICD-10-CM | POA: Diagnosis not present

## 2019-01-02 DIAGNOSIS — J449 Chronic obstructive pulmonary disease, unspecified: Secondary | ICD-10-CM | POA: Diagnosis not present

## 2019-01-02 DIAGNOSIS — M159 Polyosteoarthritis, unspecified: Secondary | ICD-10-CM | POA: Diagnosis not present

## 2019-01-02 DIAGNOSIS — K56699 Other intestinal obstruction unspecified as to partial versus complete obstruction: Secondary | ICD-10-CM | POA: Diagnosis not present

## 2019-01-02 DIAGNOSIS — E1142 Type 2 diabetes mellitus with diabetic polyneuropathy: Secondary | ICD-10-CM | POA: Diagnosis not present

## 2019-01-03 ENCOUNTER — Other Ambulatory Visit: Payer: Self-pay | Admitting: Physician Assistant

## 2019-01-03 ENCOUNTER — Other Ambulatory Visit: Payer: Self-pay | Admitting: Family Medicine

## 2019-01-03 ENCOUNTER — Encounter: Payer: Self-pay | Admitting: Family Medicine

## 2019-01-03 ENCOUNTER — Telehealth: Payer: Self-pay | Admitting: Family Medicine

## 2019-01-03 MED ORDER — ALBUTEROL SULFATE HFA 108 (90 BASE) MCG/ACT IN AERS: 2.0000 | INHALATION_SPRAY | Freq: Four times a day (QID) | RESPIRATORY_TRACT | 0 refills | Status: DC | PRN

## 2019-01-03 NOTE — Progress Notes (Signed)
[name]     MRN: [id number]      DOB: 12/13/1950   HPI [name] is here for follow up and re-evaluation of chronic medical conditions, medication management and review of any available recent lab and radiology data.  Preventive health is updated, specifically  Cancer screening and Immunization.   Questions or concerns regarding consultations or procedures which the PT has had in the interim are  addressed. The PT denies any adverse reactions to current medications since the last visit.  Denies polyuria, polydipsia, blurred vision , or hypoglycemic episodes.    ROS Denies recent fever or chills. Denies sinus pressure, nasal congestion, ear pain or sore throat. Denies chest congestion, productive cough or wheezing. Denies chest pains, palpitations and leg swelling Denies abdominal pain, nausea, vomiting,diarrhea or constipation.   Denies dysuria, frequency, hesitancy or incontinence. C/o chronic  joint pain, swelling and limitation in mobility. Denies headaches, seizures, numbness, or tingling. Denies uncontrolled depression, anxiety or insomnia. Denies skin break down or rash.   PE  BP 124/66   Pulse 64   Temp 97.6 F (36.4 C) (Temporal)   Resp 15   Ht 4\' 11"  (1.499 m)   Wt 170 lb 1.9 oz (77.2 kg)   BMI 34.36 kg/m   Patient alert and oriented and in no cardiopulmonary distress.  HEENT: No facial asymmetry, EOMI,     Neck supple .  Chest: Clear to auscultation bilaterally.decreased though adequate air entry bilaterally  CVS: S1, S2 no murmurs, no S3.Regular rate.  ABD: Soft non tender.   Ext: No edema  MS: decreased  ROM spine, shoulders, hips and knees.  Skin: Intact, no ulcerations or rash noted.  Psych: Good eye contact, normal affect. Memory intact not anxious or depressed appearing.  CNS: CN 2-12 intact, power,  normal throughout.no focal deficits noted.   Assessment & Plan  Essential hypertension Controlled, no change in medication DASH diet and  commitment to daily physical activity for a minimum of 30 minutes discussed and encouraged, as a part of hypertension management. The importance of attaining a healthy weight is also discussed.  BP/Weight 01/01/2019 12/31/2018 12/24/2018 12/23/2018 12/13/2018 12/08/2018 123456  Systolic BP A999333 123456 A999333 Q000111Q 0000000 123XX123 -  Diastolic BP 66 62 67 60 67 64 -  Wt. (Lbs) 170.12 - 173 174.38 173 - 173  BMI 34.36 - 34.94 35.22 32.69 - 32.69  Some encounter information is confidential and restricted. Go to Review Flowsheets activity to see all data.       Type 2 diabetes mellitus with hyperglycemia, with long-term current use of insulin (HCC) Controlled, no change in medication [name] is reminded of the importance of commitment to daily physical activity for 30 minutes or more, as able and the need to limit carbohydrate intake to 30 to 60 grams per meal to help with blood sugar control.   The need to take medication as prescribed, test blood sugar as directed, and to call between visits if there is a concern that blood sugar is uncontrolled is also discussed.   [name] is reminded of the importance of daily foot exam, annual eye examination, and good blood sugar, blood pressure and cholesterol control.  Diabetic Labs Latest Ref Rng & Units 12/13/2018 12/13/2018 12/08/2018 12/07/2018 12/05/2018  HbA1c <5.7 % of total Hgb - - - - -  Microalbumin <2.0 mg/dL - - - - -  Micro/Creat Ratio 0.0 - 30.0 mg/g - - - - -  Chol <200 mg/dL - - - - -  HDL > OR = 50 mg/dL - - - - -  Calc LDL mg/dL (calc) - - - - -  Triglycerides <150 mg/dL - - - - -  Creatinine 0.44 - 1.00 mg/dL 0.97 0.96 0.74 0.75 0.99  GFR >60.00 mL/min - - - - -   BP/Weight 01/01/2019 12/31/2018 12/24/2018 12/23/2018 12/13/2018 12/08/2018 123456  Systolic BP A999333 123456 A999333 Q000111Q 0000000 123XX123 -  Diastolic BP 66 62 67 60 67 64 -  Wt. (Lbs) 170.12 - 173 174.38 173 - 173  BMI 34.36 - 34.94 35.22 32.69 - 32.69  Some encounter information is confidential  and restricted. Go to Review Flowsheets activity to see all data.   Foot/eye exam completion dates Latest Ref Rng & Units 01/01/2019 04/30/2018  Eye Exam No Retinopathy - Retinopathy(A)  Foot exam Order - - -  Foot Form Completion - Done -  Managed by Endo     Bipolar disorder ([place]) Followed by Psychiatry controlled, no medication change  Light cigarette smoker (1-9 cigarettes per day) Asked:confirms currently smokes cigarettes Assess: Unwilling to quit but cutting back Advise: needs to QUIT to reduce risk of cancer, cardio and cerebrovascular disease Assist: counseled for 5 minutes and literature provided Arrange: follow up in 3 months   Hypothyroidism Controlled, no change in medication   COPD mixed type ([place]) C/o incrased wheezing and shortness of breath, albuterol refilled

## 2019-01-03 NOTE — Telephone Encounter (Signed)
Pt LVM that she needs an inhaler called in

## 2019-01-03 NOTE — Assessment & Plan Note (Signed)
Controlled, no change in medication DASH diet and commitment to daily physical activity for a minimum of 30 minutes discussed and encouraged, as a part of hypertension management. The importance of attaining a healthy weight is also discussed.  BP/Weight 01/01/2019 12/31/2018 12/24/2018 12/23/2018 12/13/2018 12/08/2018 123456  Systolic BP A999333 123456 A999333 Q000111Q 0000000 123XX123 -  Diastolic BP 66 62 67 60 67 64 -  Wt. (Lbs) 170.12 - 173 174.38 173 - 173  BMI 34.36 - 34.94 35.22 32.69 - 32.69  Some encounter information is confidential and restricted. Go to Review Flowsheets activity to see all data.

## 2019-01-03 NOTE — Assessment & Plan Note (Signed)
C/o incrased wheezing and shortness of breath, albuterol refilled

## 2019-01-03 NOTE — Assessment & Plan Note (Signed)
Controlled, no change in medication [name] is reminded of the importance of commitment to daily physical activity for 30 minutes or more, as able and the need to limit carbohydrate intake to 30 to 60 grams per meal to help with blood sugar control.   The need to take medication as prescribed, test blood sugar as directed, and to call between visits if there is a concern that blood sugar is uncontrolled is also discussed.   [name] is reminded of the importance of daily foot exam, annual eye examination, and good blood sugar, blood pressure and cholesterol control.  Diabetic Labs Latest Ref Rng & Units 12/13/2018 12/13/2018 12/08/2018 12/07/2018 12/05/2018  HbA1c <5.7 % of total Hgb - - - - -  Microalbumin <2.0 mg/dL - - - - -  Micro/Creat Ratio 0.0 - 30.0 mg/g - - - - -  Chol <200 mg/dL - - - - -  HDL > OR = 50 mg/dL - - - - -  Calc LDL mg/dL (calc) - - - - -  Triglycerides <150 mg/dL - - - - -  Creatinine 0.44 - 1.00 mg/dL 0.97 0.96 0.74 0.75 0.99  GFR >60.00 mL/min - - - - -   BP/Weight 01/01/2019 12/31/2018 12/24/2018 12/23/2018 12/13/2018 12/08/2018 123456  Systolic BP A999333 123456 A999333 Q000111Q 0000000 123XX123 -  Diastolic BP 66 62 67 60 67 64 -  Wt. (Lbs) 170.12 - 173 174.38 173 - 173  BMI 34.36 - 34.94 35.22 32.69 - 32.69  Some encounter information is confidential and restricted. Go to Review Flowsheets activity to see all data.   Foot/eye exam completion dates Latest Ref Rng & Units 01/01/2019 04/30/2018  Eye Exam No Retinopathy - Retinopathy(A)  Foot exam Order - - -  Foot Form Completion - Done -  Managed by Endo

## 2019-01-03 NOTE — Telephone Encounter (Signed)
Medication has been prescribed.

## 2019-01-03 NOTE — Assessment & Plan Note (Signed)
Followed by Psychiatry controlled, no medication change

## 2019-01-03 NOTE — Assessment & Plan Note (Signed)
Asked:confirms currently smokes cigarettes Assess: Unwilling to quit but cutting back Advise: needs to QUIT to reduce risk of cancer, cardio and cerebrovascular disease Assist: counseled for 5 minutes and literature provided Arrange: follow up in 3 months  

## 2019-01-03 NOTE — Progress Notes (Signed)
Ventolin hfa

## 2019-01-03 NOTE — Assessment & Plan Note (Signed)
Controlled, no change in medication  

## 2019-01-06 DIAGNOSIS — E1142 Type 2 diabetes mellitus with diabetic polyneuropathy: Secondary | ICD-10-CM | POA: Diagnosis not present

## 2019-01-06 DIAGNOSIS — M7542 Impingement syndrome of left shoulder: Secondary | ICD-10-CM | POA: Diagnosis not present

## 2019-01-06 DIAGNOSIS — M159 Polyosteoarthritis, unspecified: Secondary | ICD-10-CM | POA: Diagnosis not present

## 2019-01-06 DIAGNOSIS — K56699 Other intestinal obstruction unspecified as to partial versus complete obstruction: Secondary | ICD-10-CM | POA: Diagnosis not present

## 2019-01-06 DIAGNOSIS — R140 Abdominal distension (gaseous): Secondary | ICD-10-CM | POA: Diagnosis not present

## 2019-01-06 DIAGNOSIS — J449 Chronic obstructive pulmonary disease, unspecified: Secondary | ICD-10-CM | POA: Diagnosis not present

## 2019-01-06 DIAGNOSIS — D320 Benign neoplasm of cerebral meninges: Secondary | ICD-10-CM | POA: Diagnosis not present

## 2019-01-06 DIAGNOSIS — M5030 Other cervical disc degeneration, unspecified cervical region: Secondary | ICD-10-CM | POA: Diagnosis not present

## 2019-01-06 DIAGNOSIS — I69354 Hemiplegia and hemiparesis following cerebral infarction affecting left non-dominant side: Secondary | ICD-10-CM | POA: Diagnosis not present

## 2019-01-07 ENCOUNTER — Other Ambulatory Visit: Payer: Self-pay

## 2019-01-07 ENCOUNTER — Ambulatory Visit (INDEPENDENT_AMBULATORY_CARE_PROVIDER_SITE_OTHER): Payer: Medicare HMO | Admitting: Podiatry

## 2019-01-07 ENCOUNTER — Ambulatory Visit (HOSPITAL_COMMUNITY): Payer: Medicare HMO

## 2019-01-07 ENCOUNTER — Encounter: Payer: Self-pay | Admitting: Podiatry

## 2019-01-07 DIAGNOSIS — E1142 Type 2 diabetes mellitus with diabetic polyneuropathy: Secondary | ICD-10-CM

## 2019-01-07 DIAGNOSIS — D320 Benign neoplasm of cerebral meninges: Secondary | ICD-10-CM | POA: Diagnosis not present

## 2019-01-07 DIAGNOSIS — J449 Chronic obstructive pulmonary disease, unspecified: Secondary | ICD-10-CM | POA: Diagnosis not present

## 2019-01-07 DIAGNOSIS — M79676 Pain in unspecified toe(s): Secondary | ICD-10-CM | POA: Diagnosis not present

## 2019-01-07 DIAGNOSIS — K56699 Other intestinal obstruction unspecified as to partial versus complete obstruction: Secondary | ICD-10-CM | POA: Diagnosis not present

## 2019-01-07 DIAGNOSIS — B351 Tinea unguium: Secondary | ICD-10-CM | POA: Diagnosis not present

## 2019-01-07 DIAGNOSIS — M159 Polyosteoarthritis, unspecified: Secondary | ICD-10-CM | POA: Diagnosis not present

## 2019-01-07 DIAGNOSIS — M5030 Other cervical disc degeneration, unspecified cervical region: Secondary | ICD-10-CM | POA: Diagnosis not present

## 2019-01-07 DIAGNOSIS — R140 Abdominal distension (gaseous): Secondary | ICD-10-CM | POA: Diagnosis not present

## 2019-01-07 DIAGNOSIS — Q828 Other specified congenital malformations of skin: Secondary | ICD-10-CM

## 2019-01-07 DIAGNOSIS — I69354 Hemiplegia and hemiparesis following cerebral infarction affecting left non-dominant side: Secondary | ICD-10-CM | POA: Diagnosis not present

## 2019-01-07 DIAGNOSIS — M7542 Impingement syndrome of left shoulder: Secondary | ICD-10-CM | POA: Diagnosis not present

## 2019-01-07 NOTE — Patient Instructions (Signed)
Corns and Calluses Corns are small areas of thickened skin that occur on the top, sides, or tip of a toe. They contain a cone-shaped core with a point that can press on a nerve below. This causes pain.  Calluses are areas of thickened skin that can occur anywhere on the body, including the hands, fingers, palms, soles of the feet, and heels. Calluses are usually larger than corns. What are the causes? Corns and calluses are caused by rubbing (friction) or pressure, such as from shoes that are too tight or do not fit properly. What increases the risk? Corns are more likely to develop in people who have misshapen toes (toe deformities), such as hammer toes. Calluses can occur with friction to any area of the skin. They are more likely to develop in people who:  Work with their hands.  Wear shoes that fit poorly, are too tight, or are high-heeled.  Have toe deformities. What are the signs or symptoms? Symptoms of a corn or callus include:  A hard growth on the skin.  Pain or tenderness under the skin.  Redness and swelling.  Increased discomfort while wearing tight-fitting shoes, if your feet are affected. If a corn or callus becomes infected, symptoms may include:  Redness and swelling that gets worse.  Pain.  Fluid, blood, or pus draining from the corn or callus. How is this diagnosed? Corns and calluses may be diagnosed based on your symptoms, your medical history, and a physical exam. How is this treated? Treatment for corns and calluses may include:  Removing the cause of the friction or pressure. This may involve: ? Changing your shoes. ? Wearing shoe inserts (orthotics) or other protective layers in your shoes, such as a corn pad. ? Wearing gloves.  Applying medicine to the skin (topical medicine) to help soften skin in the hardened, thickened areas.  Removing layers of dead skin with a file to reduce the size of the corn or callus.  Removing the corn or callus with a  scalpel or laser.  Taking antibiotic medicines, if your corn or callus is infected.  Having surgery, if a toe deformity is the cause. Follow these instructions at home:   Take over-the-counter and prescription medicines only as told by your health care provider.  If you were prescribed an antibiotic, take it as told by your health care provider. Do not stop taking it even if your condition starts to improve.  Wear shoes that fit well. Avoid wearing high-heeled shoes and shoes that are too tight or too loose.  Wear any padding, protective layers, gloves, or orthotics as told by your health care provider.  Soak your hands or feet and then use a file or pumice stone to soften your corn or callus. Do this as told by your health care provider.  Check your corn or callus every day for symptoms of infection. Contact a health care provider if you:  Notice that your symptoms do not improve with treatment.  Have redness or swelling that gets worse.  Notice that your corn or callus becomes painful.  Have fluid, blood, or pus coming from your corn or callus.  Have new symptoms. Summary  Corns are small areas of thickened skin that occur on the top, sides, or tip of a toe.  Calluses are areas of thickened skin that can occur anywhere on the body, including the hands, fingers, palms, and soles of the feet. Calluses are usually larger than corns.  Corns and calluses are caused by   rubbing (friction) or pressure, such as from shoes that are too tight or do not fit properly.  Treatment may include wearing any padding, protective layers, gloves, or orthotics as told by your health care provider. This information is not intended to replace advice given to you by your health care provider. Make sure you discuss any questions you have with your health care provider. Document Released: 12/04/2003 Document Revised: 06/19/2018 Document Reviewed: 01/10/2017 Elsevier Patient Education  2020 Elsevier  Inc.  

## 2019-01-08 ENCOUNTER — Telehealth: Payer: Self-pay | Admitting: Radiation Therapy

## 2019-01-08 ENCOUNTER — Telehealth: Payer: Self-pay | Admitting: *Deleted

## 2019-01-08 ENCOUNTER — Telehealth: Payer: Self-pay | Admitting: Internal Medicine

## 2019-01-08 ENCOUNTER — Other Ambulatory Visit: Payer: Self-pay | Admitting: Radiation Therapy

## 2019-01-08 DIAGNOSIS — D329 Benign neoplasm of meninges, unspecified: Secondary | ICD-10-CM

## 2019-01-08 NOTE — Telephone Encounter (Signed)
Spoke with [name] about her MRI and follow-up with Dr. Sherwood Gambler in Feb 2021. She has recorded these visits and plans to call and set up transportation closer to the time for the scan.   Mont Dutton R.T.(R)(T) Radiation Special Procedures Navigator

## 2019-01-08 NOTE — Telephone Encounter (Signed)
Pt would like to know about [facility] if it showed anything also wants to know if she can get inhaler sent in as she really needs it

## 2019-01-08 NOTE — Telephone Encounter (Signed)
Pt states she takes amitiza bid and linzess in the afternoon. Reports she is having a liquid stool every day but she still feels like there is hard stool down low that she cannot pass. Discussed with pt that she could have an impaction and the liquid stool is coming out around the impaction. Let pt know she would need to be seen in the ER to possibly be disimpacted. Pt states she will try to get a ride to the hospital. She will continue the amitiza and linzess and she knows to keep her OV as scheduled .

## 2019-01-08 NOTE — Telephone Encounter (Signed)
Pt states that she has lost a lot of weight and wants to have a colonoscopy. She cannot wait until she sees Dr. Hilarie Fredrickson on 11/11.

## 2019-01-09 ENCOUNTER — Ambulatory Visit (INDEPENDENT_AMBULATORY_CARE_PROVIDER_SITE_OTHER): Payer: Medicare HMO | Admitting: Internal Medicine

## 2019-01-09 ENCOUNTER — Ambulatory Visit (INDEPENDENT_AMBULATORY_CARE_PROVIDER_SITE_OTHER): Payer: Medicare HMO

## 2019-01-09 ENCOUNTER — Other Ambulatory Visit: Payer: Self-pay

## 2019-01-09 ENCOUNTER — Encounter: Payer: Self-pay | Admitting: Internal Medicine

## 2019-01-09 ENCOUNTER — Ambulatory Visit (INDEPENDENT_AMBULATORY_CARE_PROVIDER_SITE_OTHER): Payer: Medicare HMO | Admitting: Psychiatry

## 2019-01-09 VITALS — BP 136/60 | HR 72 | Temp 97.2°F | Ht 59.0 in | Wt 169.6 lb

## 2019-01-09 DIAGNOSIS — J449 Chronic obstructive pulmonary disease, unspecified: Secondary | ICD-10-CM

## 2019-01-09 DIAGNOSIS — G4733 Obstructive sleep apnea (adult) (pediatric): Secondary | ICD-10-CM

## 2019-01-09 DIAGNOSIS — Z720 Tobacco use: Secondary | ICD-10-CM | POA: Diagnosis not present

## 2019-01-09 DIAGNOSIS — F331 Major depressive disorder, recurrent, moderate: Secondary | ICD-10-CM

## 2019-01-09 DIAGNOSIS — J9811 Atelectasis: Secondary | ICD-10-CM | POA: Diagnosis not present

## 2019-01-09 NOTE — Patient Instructions (Addendum)
Order- CXR  Dx dyspnea on exertion. Tobacco user  Order- schedule CPAP mask fitting and desensitization  Dx OSA  Please call if we can help

## 2019-01-09 NOTE — Assessment & Plan Note (Signed)
Discussed compliance goals, comfort measures.  Plan- mask fitting and desensitization. Continue auto 5-15

## 2019-01-09 NOTE — Assessment & Plan Note (Signed)
Coughing, ongoing tobacco use. Plan CXR

## 2019-01-09 NOTE — Progress Notes (Signed)
Subjective:  [name] presents to clinic today with cc of  painful, thick, discolored, elongated toenails 1-5 b/l that become tender and cannot cut because of thickness.  Pain is aggravated when wearing enclosed shoe gear.  [name] is diabetic. She voices no new pedal concerns on today's visit.   Current Outpatient Medications on File Prior to Visit  Medication Sig Dispense Refill  . ACCU-CHEK AVIVA PLUS test strip CHECK BLOOD SUGAR FOUR TIMES DAILY 400 strip 11  . acetaminophen (TYLENOL) 325 MG tablet Take 2 tablets (650 mg total) by mouth every 6 (six) hours as needed for mild pain or headache (or Fever >/= 101). (Patient not taking: Reported on 01/09/2019) 20 tablet 1  . albuterol (VENTOLIN HFA) 108 (90 Base) MCG/ACT inhaler Inhale 2 puffs into the lungs every 6 (six) hours as needed for wheezing or shortness of breath. 18 g 0  . amLODipine (NORVASC) 10 MG tablet Take 1 tablet (10 mg total) by mouth daily. 90 tablet 3  . ascorbic acid (VITAMIN C) 500 MG tablet Take 500 mg by mouth daily.    [name] aspirin EC 81 MG tablet Take 1 tablet (81 mg total) by mouth daily with breakfast. 120 tablet 2  . bisacodyl (DULCOLAX) 10 MG suppository Place 1 suppository (10 mg total) rectally daily as needed for mild constipation or moderate constipation. 12 suppository 2  . cetirizine (ZYRTEC) 10 MG tablet Take 1 tablet (10 mg total) by mouth daily. 7 tablet 0  . Cholecalciferol (VITAMIN D3) 5000 units CAPS Take 1 capsule by mouth daily.    . clobetasol cream (TEMOVATE) [id number] % Apply 1 application topically 2 (two) times daily.     . diclofenac sodium (VOLTAREN) 1 % GEL APPLY 2 GRAMS  TOPICALLY 4 (FOUR) TIMES DAILY. 200 g 1  . insulin aspart (NOVOLOG FLEXPEN) 100 UNIT/ML FlexPen INJECT 10 TO 14 UNITS INTO THE SKIN 3 (THREE) TIMES DAILY WITH MEALS. 45 mL 3  . Insulin Glargine (TOUJEO SOLOSTAR) 300 UNIT/ML SOPN Inject 20 Units into the skin at bedtime. 9 pen 3  . lamoTRIgine (LAMICTAL) 100 MG tablet Take 1  tablet (100 mg total) by mouth 2 (two) times daily. 180 tablet 2  . levothyroxine (SYNTHROID) 50 MCG tablet Take 1 tablet (50 mcg total) by mouth daily before breakfast. 30 tablet 5  . linaclotide (LINZESS) 145 MCG CAPS capsule Take 1 capsule (145 mcg total) by mouth daily before breakfast. 30 capsule 6  . LORazepam (ATIVAN) 0.5 MG tablet Take 1 tablet (0.5 mg total) by mouth 2 (two) times daily. 180 tablet 2  . losartan (COZAAR) 50 MG tablet TAKE 1 TABLET EVERY DAY 90 tablet 1  . lubiprostone (AMITIZA) 24 MCG capsule Take 1 capsule (24 mcg total) by mouth 2 (two) times daily with a meal. 60 capsule 6  . metaxalone (SKELAXIN) 800 MG tablet metaxalone 800 mg tablet  take 1 tablet by mouth 3 times daily as needed.    . metoprolol tartrate (LOPRESSOR) 50 MG tablet TAKE 1 TABLET TWICE DAILY (NEED MD APPOINTMENT) 180 tablet 3  . montelukast (SINGULAIR) 10 MG tablet TAKE 1 TABLET EVERY DAY 90 tablet 1  . Multiple Vitamins-Minerals (HM MULTIVITAMIN ADULT GUMMY PO) Take 1 tablet by mouth daily.    [name] nystatin (MYCOSTATIN/NYSTOP) powder APPLY TO AFFECTED AREA 4 TIMES DAILY. 30 g 2  . Omega-3 Fatty Acids (FISH OIL PO) Take 1 capsule by mouth daily.    . ondansetron (ZOFRAN) 4 MG tablet Take 1 tablet (4  mg total) by mouth every 8 (eight) hours as needed for nausea or vomiting. 20 tablet 0  . oxyCODONE-acetaminophen (PERCOCET) 10-325 MG tablet Take 1 tablet by mouth every 8 (eight) hours as needed.     [name] PAZEO 0.7 % SOLN Place 1 drop into the right eye 2 (two) times daily.     . pregabalin (LYRICA) 75 MG capsule Take 1 capsule (75 mg total) by mouth daily.    . RABEprazole (ACIPHEX) 20 MG tablet TAKE 1 TABLET TWICE DAILY 180 tablet 3  . RESTASIS 0.05 % ophthalmic emulsion Place 1 drop into both eyes 2 (two) times daily.     . risperiDONE (RISPERDAL) 0.5 MG tablet Take 1 tablet (0.5 mg total) by mouth at bedtime. 90 tablet 2  . rosuvastatin (CRESTOR) 5 MG tablet Take 1 tablet (5 mg total) by mouth at bedtime.  90 tablet 2  . sertraline (ZOLOFT) 100 MG tablet Take 1 tablet (100 mg total) by mouth daily. 90 tablet 2  . Tiotropium Bromide-Olodaterol (STIOLTO RESPIMAT) 2.5-2.5 MCG/ACT AERS Inhale 2 puffs into the lungs daily. 12 g 3  . traZODone (DESYREL) 100 MG tablet Take 1 tablet (100 mg total) by mouth at bedtime. 90 tablet 2   Current Facility-Administered Medications on File Prior to Visit  Medication Dose Route Frequency Provider Last Rate Last Dose  . 0.9 %  sodium chloride infusion   Intravenous Continuous Lockamy, Randi L, NP-C   Stopped at 12/31/18 1542     Allergies  Allergen Reactions  . Cephalexin Hives  . Iron Nausea And Vomiting  . Milk-Related Compounds Other (See Comments)    Doesn't agree with stomach.   . Penicillins Hives    Has patient had a PCN reaction causing immediate rash, facial/tongue/throat swelling, SOB or lightheadedness with hypotension: Yes Has patient had a PCN reaction causing severe rash involving mucus membranes or skin necrosis: No Has patient had a PCN reaction that required hospitalization No Has patient had a PCN reaction occurring within the last 10 years: No If all of the above answers are "NO", then may proceed with Cephalosporin use.   [name] Phenazopyridine Hcl Hives           Objective: There were no vitals filed for this visit.  Physical Examination:  Vascular Examination: Capillary refill time immediate x 10 digits.  DP/PT pulses 1/4 b/l.  Digital hair sparse b/l.   No edema noted b/l.  Skin temperature gradient WNL b/l.  Dermatological Examination: Skin with normal turgor, texture and tone b/l.  No open wounds b/l.  No interdigital macerations noted b/l.  Elongated, thick, discolored brittle toenails with subungual debris and pain on dorsal palpation of nailbeds 1-5 b/l.  Porokeratotic lesions submet head 1 right, plantarlateral right foot and plantar heel left foot with tenderness to palpation. No erythema, no edema, no  drainage, no flocculence.  Musculoskeletal Examination: Muscle strength 5/5 to all muscle groups b/l.  No pain, crepitus or joint discomfort with active/passive ROM.  Neurological Examination: Sensation diminished b/l with 10 gram monofilament.  Assessment: Mycotic nail infection with pain 1-5 b/l Porokeratoses submet head 1 right, plantarlateral right foot and plantar heel left foot NIDDM with neuropathy  Plan: 1. Toenails 1-5 b/l were debrided in length and girth without iatrogenic laceration. 2. Porokeratosis submet head 1 right, plantarlateral right foot and plantar heel left foot pared and enucleated with sterile scalpel blade without incident. 3. Continue soft, supportive shoe gear daily. 4. Report any pedal injuries to medical professional. 5.  Follow up 3 months. 6. Patient/POA to call should there be a question/concern in there interim.

## 2019-01-09 NOTE — Progress Notes (Signed)
HPI F Former smoker followed for obstructive sleep apnea with hypersomnia, , R/O'd Lung nodule,Wheezing dyspnea, complicated by hx resection of meningioma. Bipolar, GERD, DM, chronic epistaxis NPSG 3/8/13[name]- mild obstructive sleep apnea, AHI 14 per hour, mostly in REM. Epworth sleepiness score 14/24. Weight 164 pounds. She is living alone, divorced and disabled. Son has OSA. History of meningioma 11/23/2011. No seizure in over 3 years. Does not drive Office Spirometry 10/30/2013-submaximal effort based on appearance of loop and curve. Numbers would fit with severe restriction but her physiologic capability may be better than this. FVC 0.91/44%, FEV1 0.74/45%, FEV1/FVC 0.81, FEF 25-75% 1.43/69%. Office Spirometry 07/27/2015-weak effort but still better than her last previous trial effort. Mild restriction of exhaled volume. FVC 1.35/67%, FEV1 1.27/79%, FEV1/FVC 0.94, FEF 25-75 percent 2.14/107% -------------------------------------------------------------------------------------------------------- 07/05/2018- Virtual Visit via Telephone Note History of Present Illness: 71 yoF smoker followed for obstructive sleep apnea with hypersomnia, , R/O'd Lung nodule,Wheezing dyspnea/ tobacco, complicated by hx resection of meningioma. Bipolar, GERD, DM, chronic epistaxis  CPAP 5-15 auto/ [employer] [name])  She just got replacement machine to restart on April 17, so download not yet available. She feels current mask is too big, and has asked for better fit. Occasional wheeze- not bad. She says her grip is not strong enough to activate Anoro, but ok with Ventolin. I went back over this issue with her, but she was quite clear that she has trouble activating Anoro. Smoking 5 cigs/ day Observations/Objective: NPSG 3/8/13[name]- mild obstructive sleep apnea, AHI 14 per hour, mostly in REM. Epworth sleepiness score 14/24. Weight 164 pounds. She is living alone, divorced and disabled. Son has  OSA. History of meningioma 11/23/2011. No seizure in over 3 years. Does not drive Office Spirometry 10/30/2013-submaximal effort based on appearance of loop and curve. Numbers would fit with severe restriction but her physiologic capability may be better than this. FVC 0.91/44%, FEV1 0.74/45%, FEV1/FVC 0.81, FEF 25-75% 1.43/69%. Office Spirometry 07/27/2015-weak effort but still better than her last previous trial effort. Mild restriction of exhaled volume. FVC 1.35/67%, FEV1 1.27/79%, FEV1/FVC 0.94, FEF 25-75 percent 2.14/107% Over phone, her speech is slurred and difficult to understand, just as when she has been seen before in office, but she seems to be thinking clearly.  Assessment and Plan: OSA- has her new machine. May need mask refit ([employer]/ [employer] Everywhere) Wheezing dyspnea/ asthma/ COPD- Try Stiolto instead of Anoro for ease of use. Follow Up Instructions: 4 months   01/09/2019-  68 yoF smoker followed for obstructive sleep apnea with hypersomnia, , R/O'd Lung nodule,Wheezing dyspnea/ tobacco, complicated by hx resection of meningioma. Bipolar/ Depression, GERD, DM, chronic epistaxis  CPAP 5-15 auto/ [employer] ([employer]) Still smoking 5-10 cigs/ day Trazodone 100 mg hs, Stiolto 2.5, albuterol hfa -----OSA on CPAP auto 5-15, DME: [employer], pt reports issues with her current machine Body weight today 169 lbs Download compliance 13%, AHI 12.6/ hr    Many missed and short days Dislikes mask fit and complains humidifier runs dry- doesn't know how to adjust it.  Dyspnea on exertion, occasional cough- scant clear. Denies chest pain, blood, fever. Had flu vax. Discussed smoking cessation.   ROS-see HPI + = positive Constitutional:   No-   weight loss, night sweats, fevers, chills, +fatigue, lassitude. HEENT:  + headaches, +difficulty swallowing, tooth/dental problems, sore throat,       No-sneezing, itching, ear ache, nasal congestion, post nasal drip,  CV:  chest pain, orthopnea, PND, swelling  in lower extremities, anasarca, dizziness, +palpitations Resp: +  shortness of breath with exertion or at rest.                productive cough,   non-productive cough,  No- coughing up of blood.              No-   change in color of mucus.   wheezing.   Skin: No-   rash or lesions. GI:  +heartburn, +indigestion, no-abdominal pain, nausea, vomiting,  GU:  MS:  + joint pain or swelling.   Neuro-     nothing unusual Psych:  No- change in mood or affect. +depression or +anxiety.  No memory loss.  OBJ- Physical Exam - + she speaks slowly but seems alert and appropriate,  General-  Oriented, Affect-flat, Distress- none acute Skin- rash-none, lesions- none, excoriation- none Lymphadenopathy- none Head- Eyes- Gross vision intact, PERRLA, conjunctivae and secretions clear            Ears- Hearing, canals-normal + = positive            Nose- no-Septal dev, polyps, erosion, perforation             Throat- Mallampati III , mucosa clear , drainage- none, tonsils- atrophic, + dentures Neck- flexible , trachea midline, no stridor , thyroid nl, carotid no bruit Chest - symmetrical excursion , unlabored           Heart/CV- RRR , no murmur , no gallop  , no rub, nl s1 s2                           - JVD- none , edema- none, stasis changes- none, varices- none           Lung- clear to P&A, wheeze- none, cough- mild , dullness-none, rub- none           Chest wall-  Abd-  Br/ Gen/ Rectal- Not done, not indicated Extrem- cyanosis- none, clubbing, none, atrophy- none, strength- nl. +cane Neuro- + seems fully oriented but vague. Rocking in chair.

## 2019-01-09 NOTE — Telephone Encounter (Signed)
The cancer center will review their labs with her and the inhaler was sent 10/23

## 2019-01-09 NOTE — Progress Notes (Signed)
Virtual Visit via Telephone Note  I connected with [name] on 01/09/19 at  9:00 AM EDT by telephone and verified that I am speaking with the correct person using two identifiers.   I discussed the limitations, risks, security and privacy concerns of performing an evaluation and management service by telephone and the availability of in person appointments. I also discussed with the patient that there may be a patient responsible charge related to this service. The patient expressed understanding and agreed to proceed.  I provided 45 minutes of non-face-to-face time during this encounter.   [name], LCSW               THERAPIST PROGRESS NOTE  Session Time: Thursday 01/09/2019 9:10 AM -  9:55 AM   Participation Level: Active  Behavioral Response: lethargic, depressed, anxious  Type of Therapy: Individual Therapy  Treatment Goals addressed: Learn and implement behavioral strategies to overcome depression.                                                       Verbalize an understanding and resolution of current interpersonal problems.   Interventions: CBT and Supportive  Summary: [name] is a 68 y.o. female who presents with  long standing history of recurrent periods  of depression beginning when she was 65 and her favorite uncle died. Patient reports multiple psychiatric hospitlaizations due to depression and suicidal ideations with the last one occuring in 1997. Patient has participated in outpatient psychotherapy and medication management intermittently since age 60. She currently is seeing psychiatrist Dr. Harrington Challenger . Prior to this, she was seen at [facility]. Patient also has had ECT at [facility]. Symptoms have worsened in recent months due to family stress and have  included depressed mood, anxiety, excessive worry, and tearfulness.   Patient's last contact was by virtual visit via telephone  3-4 weeks ago. She reports increased stress and anxiety regarding  ongoing medical conditions. She expresses frustration as she states there seems to be a new problem every time she sees a doctor. She reports worry as she recently realized after talking with personnel from cancer center the radiation treatment for tumor on her brain only slows the growth. She reports she initially thought radiation was supposed to remove the tumor. She is scheduled to have MRI in February 2021 and ill follow up with doctor to get results regarding size of tumor.  She expresses frustration her son and niece do not attend appointments with her and states rarely wanting and needing their support.   Suicidal/Homicidal: Nowithout intent/plan  Therapist Response: Reviewed symptoms, discussed stressors, facilitated expression of thoughts and feelings, validated feelings, discussed ways to improve assertive communication with her son and niece with the use of  "I" messages to express her feelings and request their support/help with appointments, reviewed coping statements to cope with stress, anxiety, and worry  Plan: Return again in 2 weeks.  Diagnosis: Axis I: MDD, Recurrent, moderate    Axis II: Deferred    Quinnlyn Hearns E Madeleyn Schwimmer, LCSW

## 2019-01-10 ENCOUNTER — Telehealth (HOSPITAL_COMMUNITY): Payer: Self-pay | Admitting: *Deleted

## 2019-01-10 ENCOUNTER — Other Ambulatory Visit: Payer: Self-pay

## 2019-01-10 ENCOUNTER — Telehealth: Payer: Self-pay | Admitting: Internal Medicine

## 2019-01-10 ENCOUNTER — Other Ambulatory Visit (HOSPITAL_COMMUNITY): Payer: Self-pay | Admitting: Psychiatry

## 2019-01-10 ENCOUNTER — Other Ambulatory Visit: Payer: Self-pay | Admitting: Internal Medicine

## 2019-01-10 ENCOUNTER — Inpatient Hospital Stay (HOSPITAL_COMMUNITY): Payer: Medicare HMO

## 2019-01-10 VITALS — BP 128/52 | HR 69 | Temp 96.9°F | Resp 18

## 2019-01-10 DIAGNOSIS — E119 Type 2 diabetes mellitus without complications: Secondary | ICD-10-CM | POA: Diagnosis not present

## 2019-01-10 DIAGNOSIS — D509 Iron deficiency anemia, unspecified: Secondary | ICD-10-CM | POA: Diagnosis not present

## 2019-01-10 DIAGNOSIS — K5900 Constipation, unspecified: Secondary | ICD-10-CM | POA: Diagnosis not present

## 2019-01-10 DIAGNOSIS — R110 Nausea: Secondary | ICD-10-CM | POA: Diagnosis not present

## 2019-01-10 DIAGNOSIS — R911 Solitary pulmonary nodule: Secondary | ICD-10-CM

## 2019-01-10 DIAGNOSIS — M797 Fibromyalgia: Secondary | ICD-10-CM | POA: Diagnosis not present

## 2019-01-10 DIAGNOSIS — Z79899 Other long term (current) drug therapy: Secondary | ICD-10-CM | POA: Diagnosis not present

## 2019-01-10 DIAGNOSIS — R5383 Other fatigue: Secondary | ICD-10-CM | POA: Diagnosis not present

## 2019-01-10 DIAGNOSIS — J449 Chronic obstructive pulmonary disease, unspecified: Secondary | ICD-10-CM | POA: Diagnosis not present

## 2019-01-10 DIAGNOSIS — F319 Bipolar disorder, unspecified: Secondary | ICD-10-CM | POA: Diagnosis not present

## 2019-01-10 MED ORDER — ONDANSETRON HCL 4 MG/2ML IJ SOLN
4.0000 mg | Freq: Once | INTRAMUSCULAR | Status: AC
  Administered 2019-01-10: 12:00:00 4 mg via INTRAVENOUS
  Filled 2019-01-10: qty 2

## 2019-01-10 MED ORDER — SODIUM CHLORIDE 0.9 % IV SOLN
510.0000 mg | Freq: Once | INTRAVENOUS | Status: AC
  Administered 2019-01-10: 510 mg via INTRAVENOUS
  Filled 2019-01-10: qty 510

## 2019-01-10 MED ORDER — DIPHENHYDRAMINE HCL 50 MG/ML IJ SOLN
25.0000 mg | Freq: Once | INTRAMUSCULAR | Status: AC
  Administered 2019-01-10: 25 mg via INTRAVENOUS
  Filled 2019-01-10: qty 1

## 2019-01-10 MED ORDER — LORAZEPAM 0.5 MG PO TABS: 0.5000 mg | ORAL_TABLET | Freq: Three times a day (TID) | ORAL | 2 refills | Status: DC

## 2019-01-10 MED ORDER — SODIUM CHLORIDE 0.9 % IV SOLN
INTRAVENOUS | Status: DC
  Administered 2019-01-10: 12:00:00 via INTRAVENOUS

## 2019-01-10 NOTE — Telephone Encounter (Signed)
Ativan increased to 0.5 mg tid

## 2019-01-10 NOTE — Telephone Encounter (Signed)
Called and spoke w/ pt regarding her CXR results from 01/09/2019 (see result note). Pt verbalized understanding. Order for CT chest, no contrast Dx lung nodule on CXR has been placed per CY. Routing this message to the PCCs as an FYI, as patient will not be available after 10:00 AM today according to message. Nothing further needed at this time.

## 2019-01-10 NOTE — Patient Outreach (Signed)
[name] [facility]) Care Management  01/10/2019  [name] 13-Mar-1951 [id number]   Telephone call to patient for follow up. No answer.  HIPAA compliant voice message left.    Plan: RN CM will attempt patient again within 4 business days and send letter.  Jone Baseman, RN, MSN [place] [facility] Care Management Coordinator Direct Line [phone] Cell [phone] Toll Free: [phone]  Fax: [phone]

## 2019-01-10 NOTE — Telephone Encounter (Signed)
PATIENT CALLED THIS MORNING SOUNDING VERY SAD/DISTURBED. I ASKED WHAT'S GOING ON? WHY DO YOU SOUND LIKE THIS? SHE SAID ' SHE GOT SOME VERY BAD NEWS'. I ASKED WHAT'S GOING ON MS [name]? SHE SAID' A NODULE WAS FOUND ON HER LUNG'. AND SAYS SHE'S VERY NERVOUS ABOUT THIS. ASKED IF SHE COULD HAVE A INCREASE IN HER ATIVAN?

## 2019-01-10 NOTE — Progress Notes (Signed)
Patient tolerated iron infusion with no complaints voiced.  Peripheral IV site clean and dry with good blood return noted before and after infusion.  Band aid applied.  VSS with discharge and left ambulatory with no s/s of distress noted.  

## 2019-01-12 DIAGNOSIS — J449 Chronic obstructive pulmonary disease, unspecified: Secondary | ICD-10-CM | POA: Diagnosis not present

## 2019-01-12 DIAGNOSIS — I10 Essential (primary) hypertension: Secondary | ICD-10-CM | POA: Diagnosis not present

## 2019-01-13 ENCOUNTER — Other Ambulatory Visit (HOSPITAL_BASED_OUTPATIENT_CLINIC_OR_DEPARTMENT_OTHER): Payer: Self-pay

## 2019-01-13 ENCOUNTER — Other Ambulatory Visit (HOSPITAL_COMMUNITY): Payer: Self-pay | Admitting: Psychiatry

## 2019-01-13 DIAGNOSIS — R32 Unspecified urinary incontinence: Secondary | ICD-10-CM | POA: Diagnosis not present

## 2019-01-15 ENCOUNTER — Other Ambulatory Visit: Payer: Self-pay

## 2019-01-15 ENCOUNTER — Ambulatory Visit: Payer: Medicare HMO | Admitting: Internal Medicine

## 2019-01-15 DIAGNOSIS — R140 Abdominal distension (gaseous): Secondary | ICD-10-CM | POA: Diagnosis not present

## 2019-01-15 DIAGNOSIS — J449 Chronic obstructive pulmonary disease, unspecified: Secondary | ICD-10-CM | POA: Diagnosis not present

## 2019-01-15 DIAGNOSIS — M7542 Impingement syndrome of left shoulder: Secondary | ICD-10-CM | POA: Diagnosis not present

## 2019-01-15 DIAGNOSIS — M159 Polyosteoarthritis, unspecified: Secondary | ICD-10-CM | POA: Diagnosis not present

## 2019-01-15 DIAGNOSIS — M5030 Other cervical disc degeneration, unspecified cervical region: Secondary | ICD-10-CM | POA: Diagnosis not present

## 2019-01-15 DIAGNOSIS — E1142 Type 2 diabetes mellitus with diabetic polyneuropathy: Secondary | ICD-10-CM | POA: Diagnosis not present

## 2019-01-15 DIAGNOSIS — K56699 Other intestinal obstruction unspecified as to partial versus complete obstruction: Secondary | ICD-10-CM | POA: Diagnosis not present

## 2019-01-15 DIAGNOSIS — D320 Benign neoplasm of cerebral meninges: Secondary | ICD-10-CM | POA: Diagnosis not present

## 2019-01-15 DIAGNOSIS — I69354 Hemiplegia and hemiparesis following cerebral infarction affecting left non-dominant side: Secondary | ICD-10-CM | POA: Diagnosis not present

## 2019-01-15 NOTE — Patient Outreach (Signed)
[facility]) Care Management  01/15/2019  Tabatha Razzano Niehaus Mar 02, 1951 [id number]   Telephone call to patient for follow up call. Patient reports she is doing ok this morning but mentions that they found a nodule on her lung and that she is going to have an ultrasound on tomorrow.  Patient expressed how she feels there is always something going on. She reports that she has met with her counselor and her ativan was increased and that has helped. Patient reports that her bowels are regularly, if she uses her medication and she is thankful for that.  Patient getting iron infusions for anemia for which she states is going ok.  Patient mentioned her smoking but she states it offers her comfort and she knows she needs to quit.  Encouraged patient to lessen cigarettes usage.  She verbalized understanding.  Patient reports that her breathing has been good and offers no concerns.  Discussed with patient COPD and signs of worsening. Patient able to describe shortness of breath and cough as signs of worsening.     Plan: RN CM will contact patient again in the month of November and patient agrees.    Jone Baseman, RN, MSN [place] [facility] Care Management Coordinator Direct Line [phone] Cell [phone] Toll Free: [phone]  Fax: [phone]

## 2019-01-16 ENCOUNTER — Other Ambulatory Visit: Payer: Self-pay

## 2019-01-16 ENCOUNTER — Ambulatory Visit (HOSPITAL_COMMUNITY)
Admission: RE | Admit: 2019-01-16 | Discharge: 2019-01-16 | Disposition: A | Discharge status: Home / Self Care | Payer: Medicare HMO | Source: Ambulatory Visit | Attending: Internal Medicine | Admitting: Internal Medicine

## 2019-01-16 DIAGNOSIS — I10 Essential (primary) hypertension: Secondary | ICD-10-CM | POA: Diagnosis not present

## 2019-01-16 DIAGNOSIS — R911 Solitary pulmonary nodule: Secondary | ICD-10-CM | POA: Insufficient documentation

## 2019-01-16 DIAGNOSIS — Z9181 History of falling: Secondary | ICD-10-CM | POA: Diagnosis not present

## 2019-01-16 DIAGNOSIS — M179 Osteoarthritis of knee, unspecified: Secondary | ICD-10-CM | POA: Diagnosis not present

## 2019-01-16 DIAGNOSIS — R6889 Other general symptoms and signs: Secondary | ICD-10-CM | POA: Diagnosis not present

## 2019-01-17 ENCOUNTER — Other Ambulatory Visit: Payer: Self-pay | Admitting: Family Medicine

## 2019-01-17 ENCOUNTER — Telehealth: Payer: Self-pay

## 2019-01-17 DIAGNOSIS — J3489 Other specified disorders of nose and nasal sinuses: Secondary | ICD-10-CM

## 2019-01-17 DIAGNOSIS — R0981 Nasal congestion: Secondary | ICD-10-CM

## 2019-01-17 MED ORDER — FLUTICASONE PROPIONATE 50 MCG/ACT NA SUSP: 2.0000 | Freq: Every day | NASAL | 6 refills | Status: DC

## 2019-01-17 MED ORDER — CROMOLYN SODIUM 5.2 MG/ACT NA AERS: 1.0000 | INHALATION_SPRAY | Freq: Four times a day (QID) | NASAL | 1 refills | Status: DC

## 2019-01-17 NOTE — Telephone Encounter (Signed)
Patient called stating she has a head cold. She had to sit outside in the cold yesterday while waiting for the driver to come back and get her. She is wanting to know what she can take otc for it. Runny nose, sounds a little congested. Please advise. Patient is on a lot of medications.

## 2019-01-17 NOTE — Telephone Encounter (Signed)
Spoke with patient and advised of treatment plan. She stated she is allergic to [facility] but will try the rest of the treatment plan. Cherly Beach NP will call in Cromolyn nasal spray in place of the Flonase. Patient is aware.

## 2019-01-20 ENCOUNTER — Telehealth: Payer: Self-pay | Admitting: Internal Medicine

## 2019-01-20 ENCOUNTER — Encounter: Payer: Self-pay | Admitting: *Deleted

## 2019-01-20 ENCOUNTER — Telehealth: Payer: Medicare HMO | Admitting: Radiation Oncology

## 2019-01-20 DIAGNOSIS — K56699 Other intestinal obstruction unspecified as to partial versus complete obstruction: Secondary | ICD-10-CM | POA: Diagnosis not present

## 2019-01-20 DIAGNOSIS — E1142 Type 2 diabetes mellitus with diabetic polyneuropathy: Secondary | ICD-10-CM | POA: Diagnosis not present

## 2019-01-20 DIAGNOSIS — D320 Benign neoplasm of cerebral meninges: Secondary | ICD-10-CM | POA: Diagnosis not present

## 2019-01-20 DIAGNOSIS — M5030 Other cervical disc degeneration, unspecified cervical region: Secondary | ICD-10-CM | POA: Diagnosis not present

## 2019-01-20 DIAGNOSIS — R140 Abdominal distension (gaseous): Secondary | ICD-10-CM | POA: Diagnosis not present

## 2019-01-20 DIAGNOSIS — M7542 Impingement syndrome of left shoulder: Secondary | ICD-10-CM | POA: Diagnosis not present

## 2019-01-20 DIAGNOSIS — I69354 Hemiplegia and hemiparesis following cerebral infarction affecting left non-dominant side: Secondary | ICD-10-CM | POA: Diagnosis not present

## 2019-01-20 DIAGNOSIS — M159 Polyosteoarthritis, unspecified: Secondary | ICD-10-CM | POA: Diagnosis not present

## 2019-01-20 DIAGNOSIS — J449 Chronic obstructive pulmonary disease, unspecified: Secondary | ICD-10-CM | POA: Diagnosis not present

## 2019-01-20 IMAGING — DX DG ABDOMEN 1V
2 series · 2 of 2 positions shown · non-contrast
Comparison: CT 05/31/2017

CLINICAL DATA: Constipation x 1 week. Hx of IBS, ovarian cyst
removal Cholecystectomy, hysterectomy, colonoscopy.

EXAM:
ABDOMEN - 1 VIEW

[abdomen kub (1 of 2)]
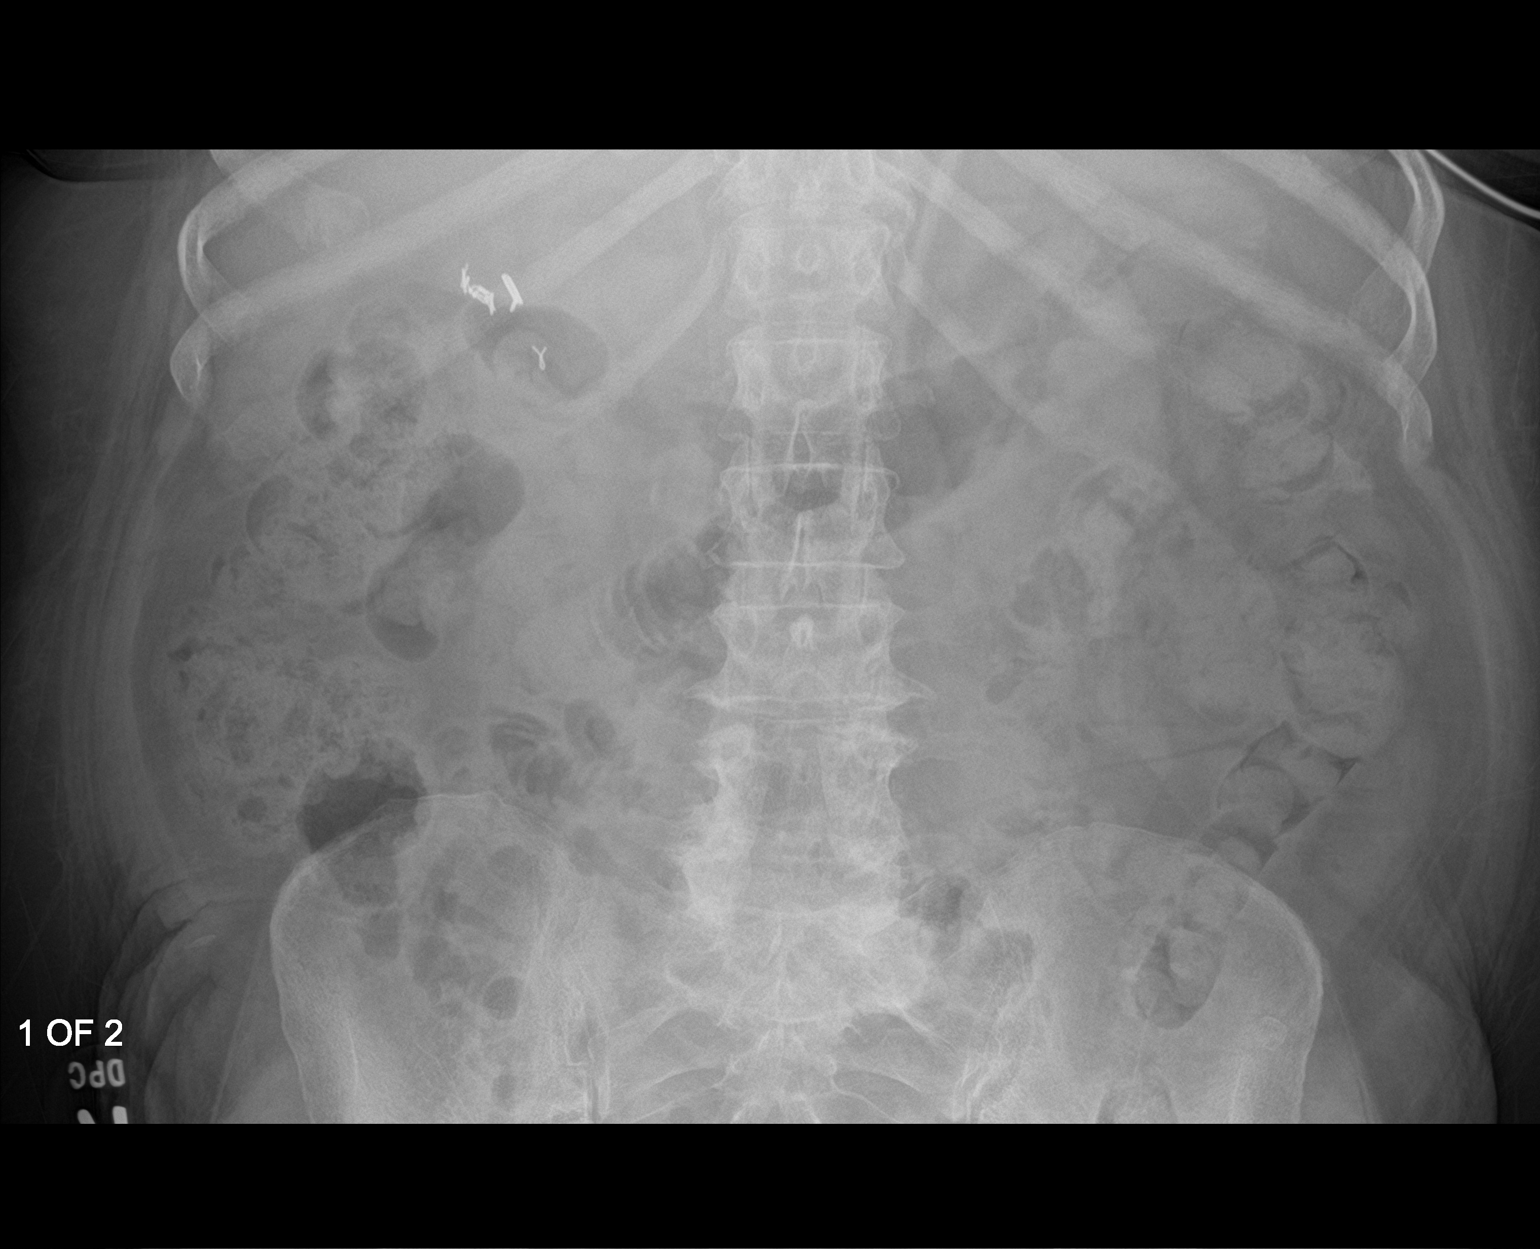

[abdomen kub (2 of 2)]
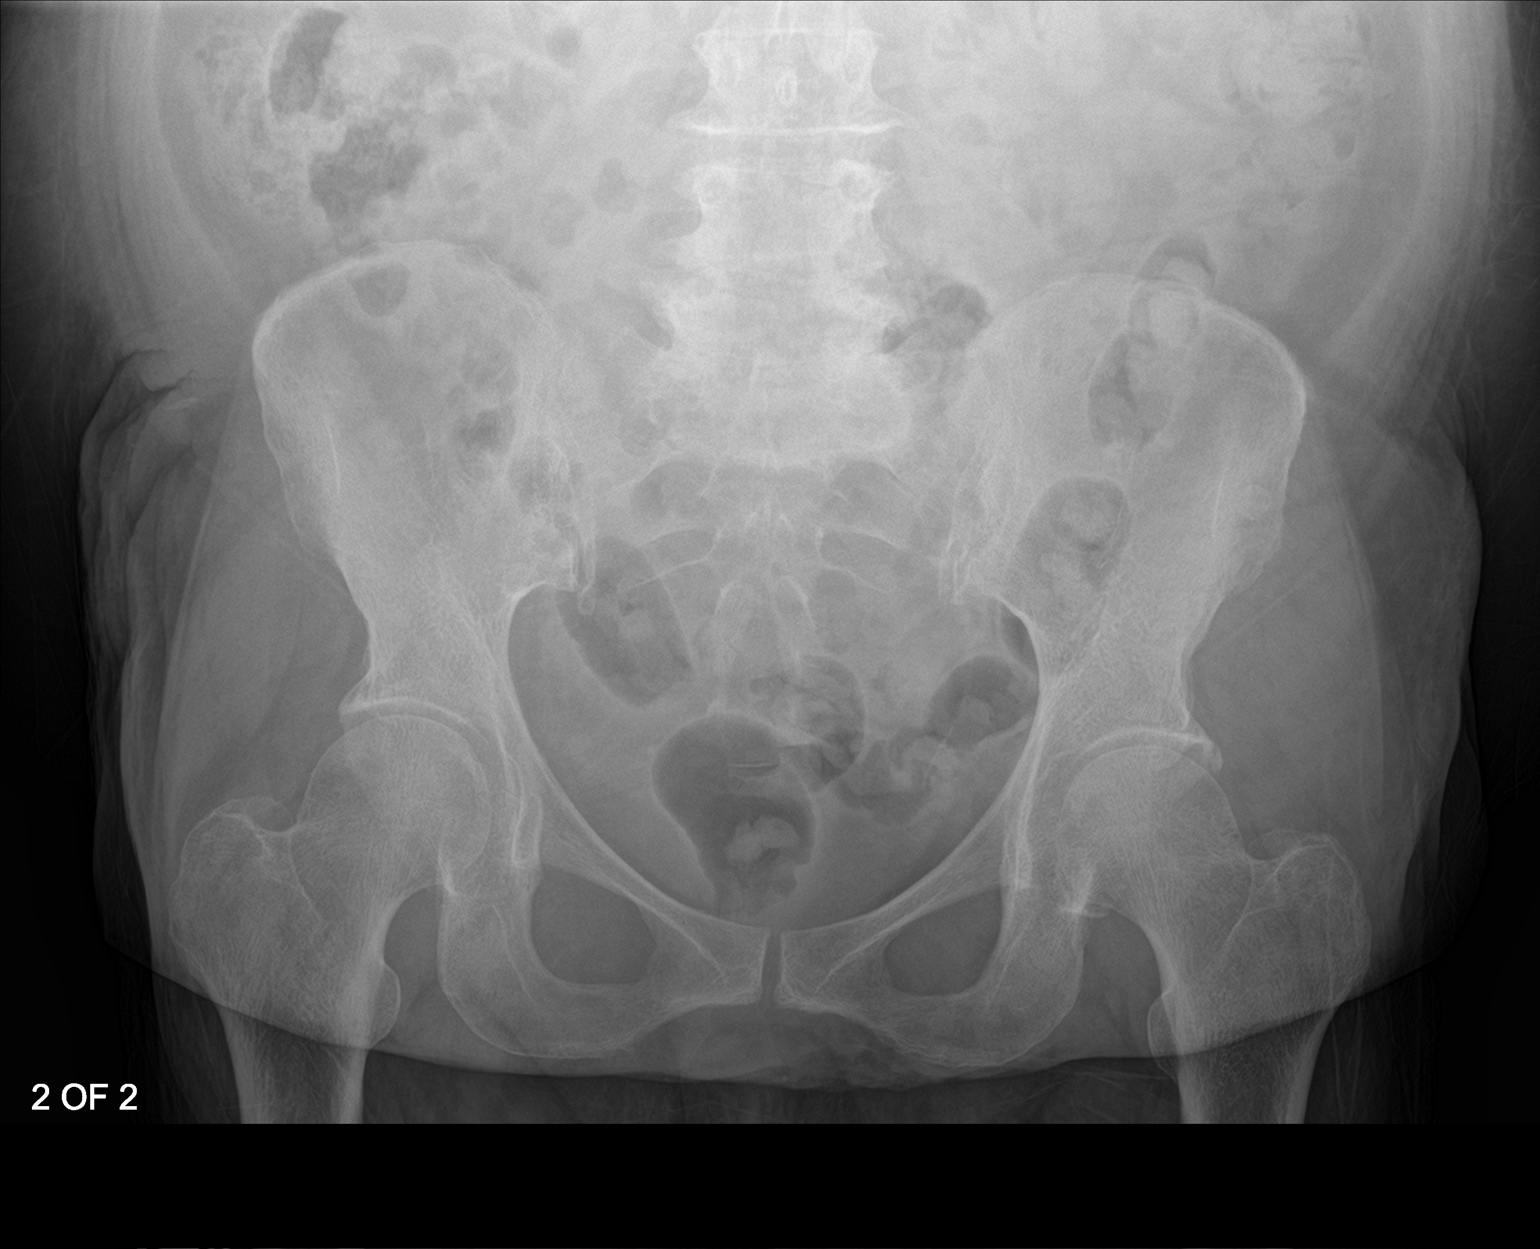

[2 of 2 positions shown; findings below may reference images not displayed]

FINDINGS: No dilated to large or small bowel. Moderate volume stool in the
ascending colon. Small volume stool in the rectosigmoid colon. No
pathologic calcifications.
IMPRESSION: No bowel obstruction.  Gas and stool throughout the colon.

## 2019-01-20 NOTE — Telephone Encounter (Signed)
Call returned to patient, confirmed DOB, states she is returning a phone call, made of CT results per CY:  Notes recorded by Deneise Lever, MD on 01/17/2019 at 8:48 AM EST  CT scan of chest- good report. There is some stable old scarring, but no new nodule or active process.  Voiced understanding. Nothing further needed at this time.

## 2019-01-20 NOTE — Progress Notes (Signed)
Pt notified results of CT scan. Nothing further needed.

## 2019-01-21 IMAGING — CT CT ABD-PELV W/ CM
2 of 5 series · 16 of 46 positions shown, 18 images · IV contrast (iopamidol)
Comparison: CT of the abdomen and pelvis performed 05/31/2017

CLINICAL DATA: Acute onset of lower abdominal pain. Patient has not
had a bowel movement in 5 days.

EXAM:
CT ABDOMEN AND PELVIS WITH CONTRAST
TECHNIQUE: Multidetector CT imaging of the abdomen and pelvis was performed
using the standard protocol following bolus administration of
intravenous contrast.
CONTRAST:  100mL [id number] IOPAMIDOL ([id number]) INJECTION 61%

[Series 2: axial st · axial · 0.76mm/px · z∈[-618,-238]mm · 13 of 86 slices shown, 15 images]
[im 5/86  soft-tissue]
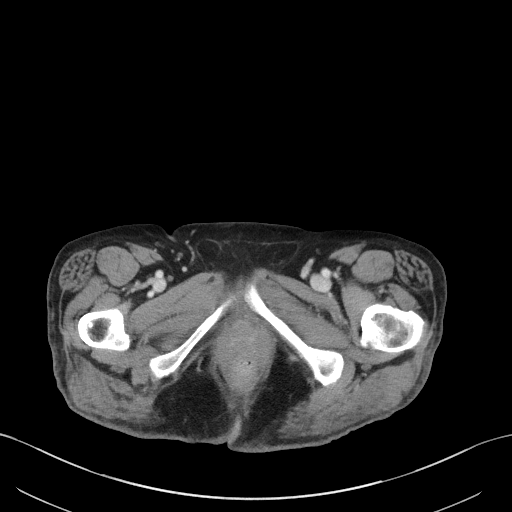
[im 5/86  bone]
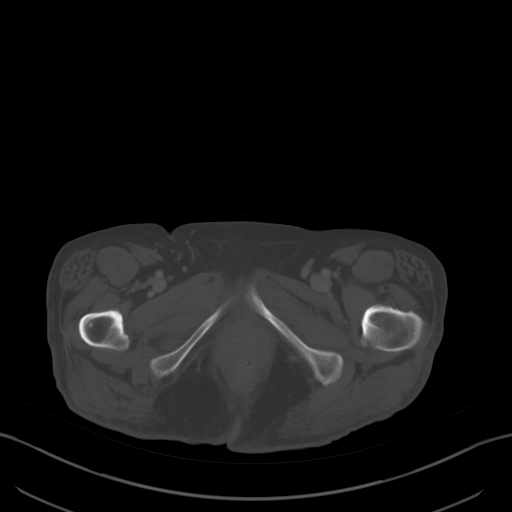
[im 14/86  soft-tissue]
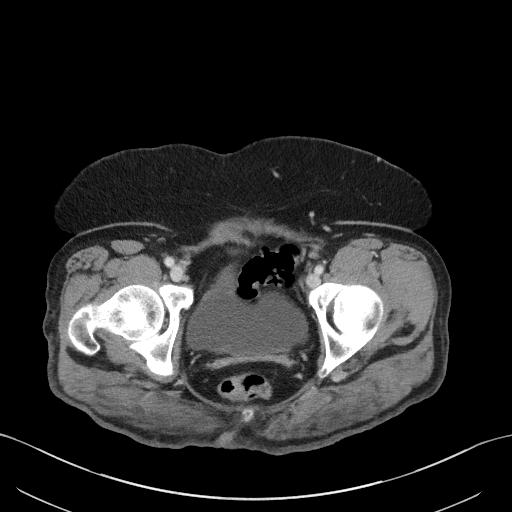
[im 18/86  soft-tissue]
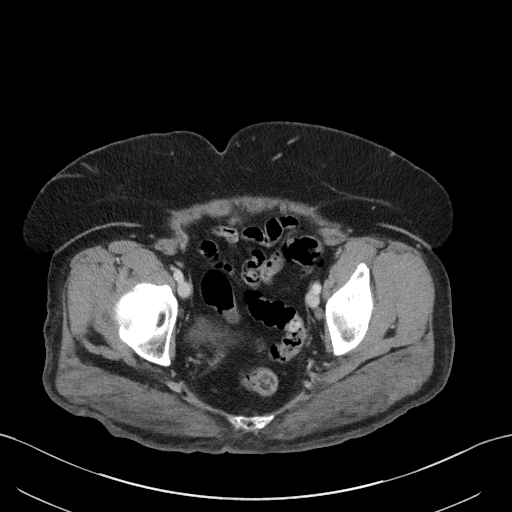
[im 23/86  soft-tissue]
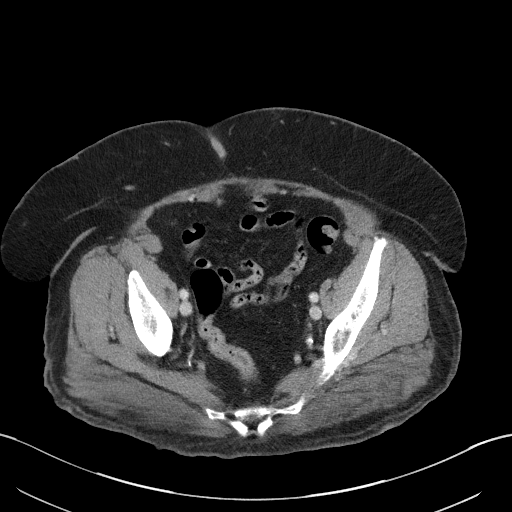
[im 32/86  soft-tissue]
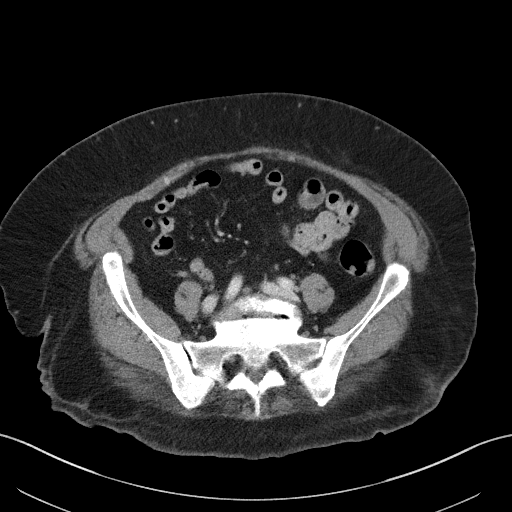
[im 36/86  soft-tissue]
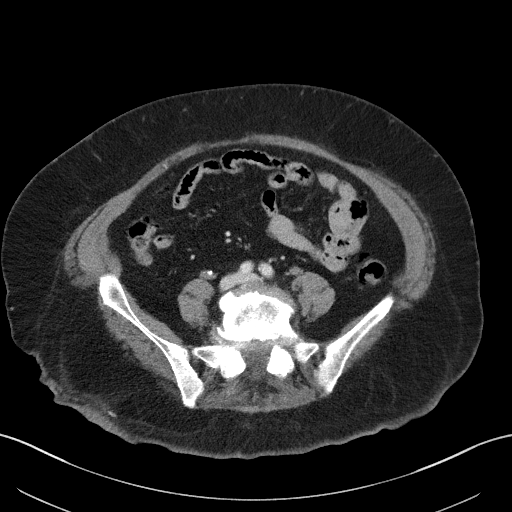
[im 45/86  soft-tissue]
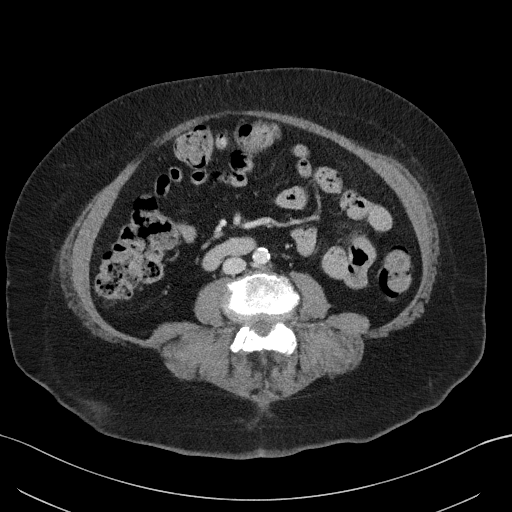
[im 50/86  soft-tissue]
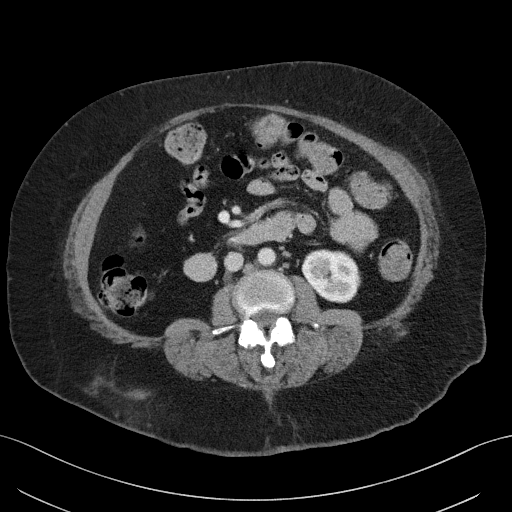
[im 54/86  soft-tissue]
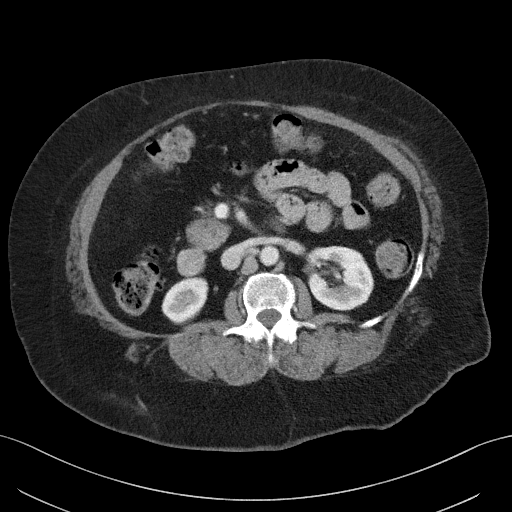
[im 54/86  bone]
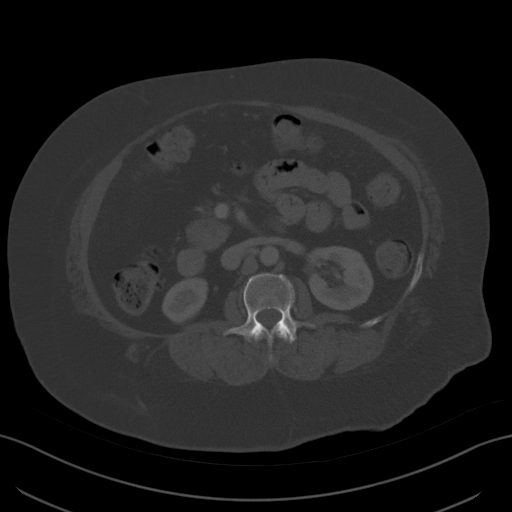
[im 63/86  soft-tissue]
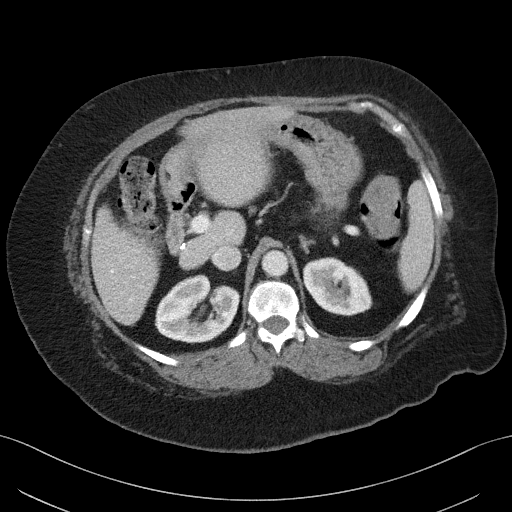
[im 68/86  soft-tissue]
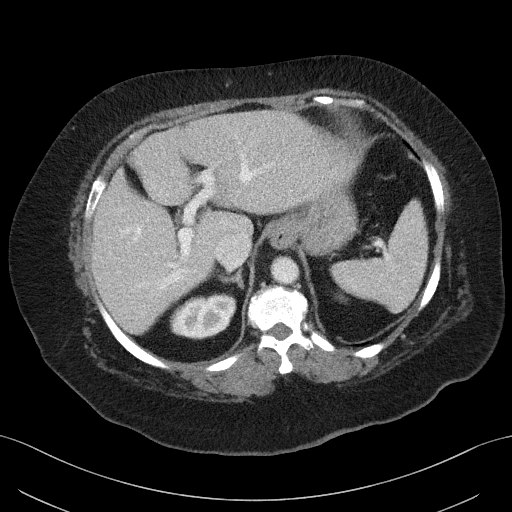
[im 72/86  soft-tissue]
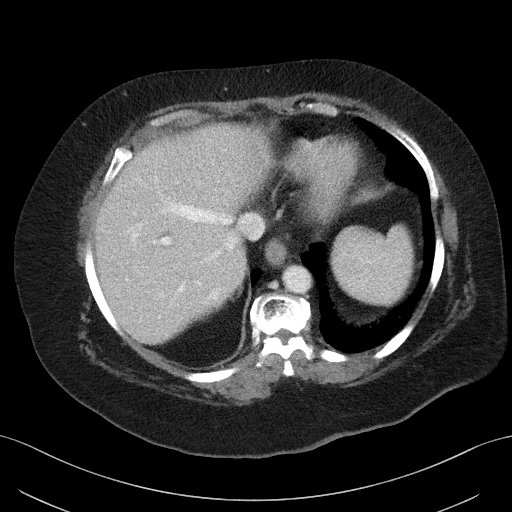
[im 81/86  soft-tissue]
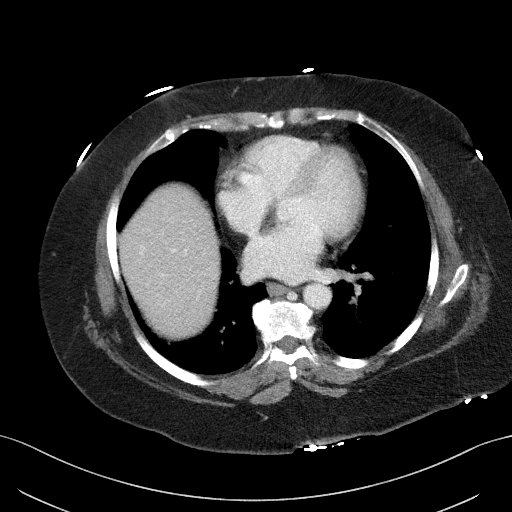

[Series 4: coronal st · coronal · 0.75mm/px · 3 of 97 slices shown]
[im 33/97  soft-tissue]
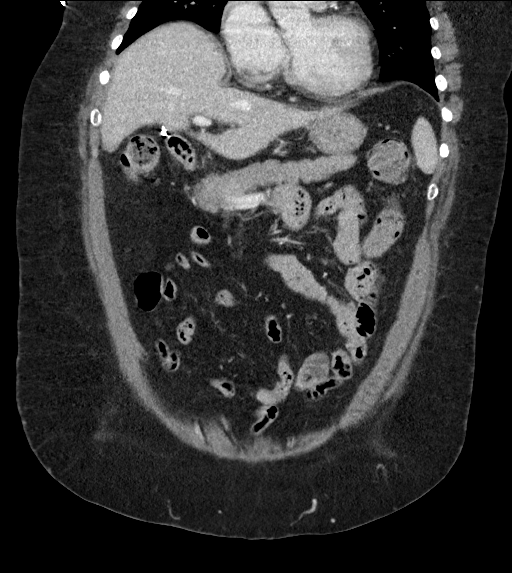
[im 43/97  soft-tissue]
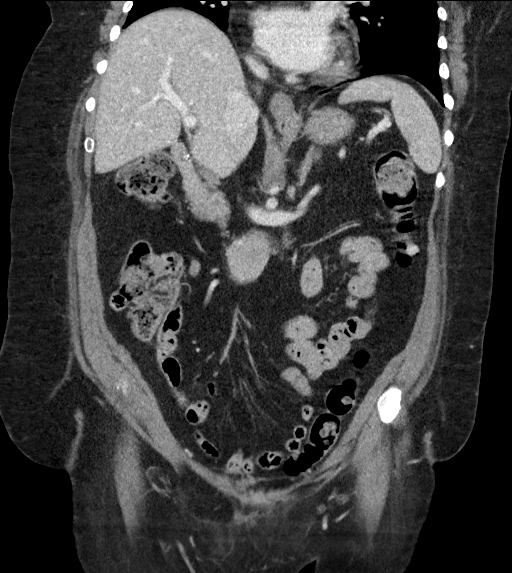
[im 54/97  soft-tissue]
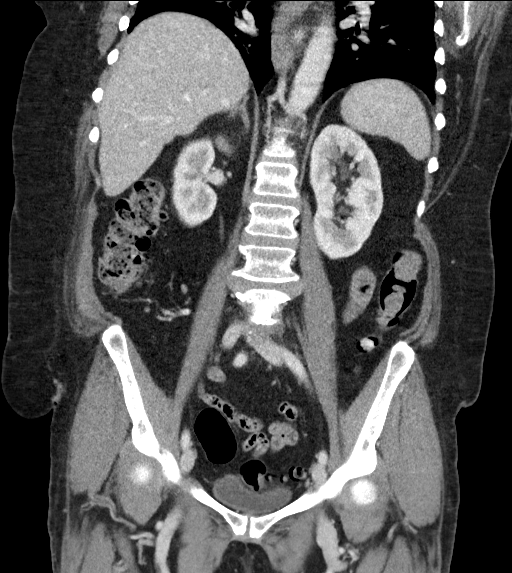

[16 of 46 positions shown; findings below may reference images not displayed]

FINDINGS: Lower chest: Minimal left basilar atelectasis is noted. The
visualized portions of the mediastinum are unremarkable.

Hepatobiliary: The liver is unremarkable in appearance. The patient
is status post cholecystectomy, with clips noted at the gallbladder
fossa. The common bile duct remains normal in caliber.

Pancreas: The pancreas is within normal limits.

Spleen: The spleen is unremarkable in appearance.

Adrenals/Urinary Tract: The adrenal glands are unremarkable in
appearance. The kidneys are within normal limits. There is no
evidence of hydronephrosis. No renal or ureteral stones are
identified. No perinephric stranding is seen.

Stomach/Bowel: The stomach is unremarkable in appearance. The small
bowel is within normal limits. The appendix is normal in caliber,
without evidence of appendicitis.

Mild diverticulosis is noted along the descending and sigmoid colon,
without evidence of diverticulitis. A small amount of stool is noted
in the colon.

Vascular/Lymphatic: Scattered calcification is seen along the
abdominal aorta and its branches. The abdominal aorta is otherwise
grossly unremarkable. The inferior vena cava is grossly
unremarkable. No retroperitoneal lymphadenopathy is seen. No pelvic
sidewall lymphadenopathy is identified.

Reproductive: The bladder is mildly distended and grossly
unremarkable. The patient is status post hysterectomy. No suspicious
adnexal masses are seen.

Other: No additional soft tissue abnormalities are seen.

Musculoskeletal: No acute osseous abnormalities are identified. The
patient is status post decompression at the lower lumbar spine.
Underlying vacuum phenomenon is noted. The visualized musculature is
unremarkable in appearance.
IMPRESSION: 1. No acute abnormality seen to explain the patient's symptoms.
Small amount of stool noted in the colon, without CT evidence for
constipation.
2. Mild diverticulosis along the descending and sigmoid colon,
without evidence of diverticulitis.

Aortic Atherosclerosis ([id number]-KE1.1).

## 2019-01-22 ENCOUNTER — Other Ambulatory Visit: Payer: Self-pay

## 2019-01-22 ENCOUNTER — Encounter: Payer: Self-pay | Admitting: Internal Medicine

## 2019-01-22 ENCOUNTER — Ambulatory Visit (INDEPENDENT_AMBULATORY_CARE_PROVIDER_SITE_OTHER): Payer: Medicare HMO | Admitting: Internal Medicine

## 2019-01-22 VITALS — BP 110/54 | HR 72 | Temp 98.9°F | Ht 59.0 in | Wt 167.8 lb

## 2019-01-22 DIAGNOSIS — M6289 Other specified disorders of muscle: Secondary | ICD-10-CM

## 2019-01-22 DIAGNOSIS — K5909 Other constipation: Secondary | ICD-10-CM | POA: Diagnosis not present

## 2019-01-22 DIAGNOSIS — K219 Gastro-esophageal reflux disease without esophagitis: Secondary | ICD-10-CM

## 2019-01-22 DIAGNOSIS — R110 Nausea: Secondary | ICD-10-CM

## 2019-01-22 NOTE — Patient Instructions (Signed)
If you are age 68 or older, your body mass index should be between 23-30. Your Body mass index is 33.89 kg/m. If this is out of the aforementioned range listed, please consider follow up with your Primary Care Provider.  If you are age 29 or younger, your body mass index should be between 19-25. Your Body mass index is 33.89 kg/m. If this is out of the aformentioned range listed, please consider follow up with your Primary Care Provider.   Please stop the Linzess.  Continue Amitiza 24 mcg twice a day. If your constipation returns, please call the office. [phone]  Use Zofran as needed.   Continue Aciphex   Follow up in 3 months. We have placed a reminder to reach out to you.   It was a pleasure to see you today!  Dr. Hilarie Fredrickson

## 2019-01-22 NOTE — Progress Notes (Signed)
Subjective:    Patient ID: [name], female    DOB: 09-18-1950, 68 y.o.   MRN: [id number]  HPI [name] is a 68 year old female with a history of chronic constipation, colon polyps, colonic diverticulosis, pelvic floor dysfunction with history of rectocele and cystocele, GERD, prior cholecystectomy, history of SBO, sleep apnea, hypertension and depression who is here for follow-up.  She was last seen on 12/23/2018.  She reports that she has had improvement in constipation with Amitiza 24 mcg twice daily and Linzess 145 mcg daily.  However with the combination she has had more urgent loose stools which has led to accidents.  Even after having loose stools she has the sense of incomplete evacuation and lower pressure in her pelvis.  No blood in her stool or melena.  She does struggle with nausea on a regular basis and she is using Zofran 2-3 times per day.  She continues on Percocet for right shoulder, cervical disc disease and chronic leg pain.  She reports her appetite comes and goes but she does have some early fullness.  No vomiting.  No dysphagia.  She has lost about 10 pounds this year.  She has been receiving IV iron.  Her last upper endoscopy and colonoscopy were reviewed today both were in November 2017.  Notable only for small nonadvanced adenoma x2, colonic diverticuli and reactive gastropathy.  Cross-sectional imaging with CT scan of the abdomen pelvis with contrast was performed last month and reassuring.  See below  Review of Systems As per HPI, otherwise negative    Objective:   Physical Exam BP (!) 110/54   Pulse 72   Temp 98.9 F (37.2 C)   Ht 4\' 11"  (1.499 m)   Wt 167 lb 12.8 oz (76.1 kg)   BMI 33.89 kg/m  Gen: awake, alert, NAD HEENT: anicteric CV: RRR, no mrg Pulm: CTA b/l Abd: soft, obese, NT/ND, +BS throughout Ext: no c/c/e Neuro: nonfocal  CT ABDOMEN AND PELVIS WITH CONTRAST   TECHNIQUE: Multidetector CT imaging of the abdomen and pelvis was  performed using the standard protocol following bolus administration of intravenous contrast.   CONTRAST:  168mL OMNIPAQUE IOHEXOL 300 MG/ML  SOLN   COMPARISON:  CT abdomen pelvis 12/04/2018   FINDINGS: Lower chest: Bandlike areas of opacity are present in the otherwise clear lung bases. Normal heart size. No pericardial effusion. Atherosclerotic calcification of the coronary arteries.   Hepatobiliary: No focal liver abnormality is seen. Patient is post cholecystectomy. Slight prominence of the biliary tree likely related to reservoir effect. No calcified intraductal gallstones.   Pancreas: Unremarkable. No pancreatic ductal dilatation or surrounding inflammatory changes.   Spleen: Normal in size without focal abnormality.   Adrenals/Urinary Tract: Adrenal glands are unremarkable. Kidneys are normal, without renal calculi, focal lesion, or hydronephrosis. No extravasation of contrast is seen on excretory phase delayed imaging. Urinary bladder is largely decompressed at the time of exam and therefore poorly evaluated by CT imaging.   Stomach/Bowel: Distal esophagus, stomach and duodenal sweep are unremarkable. No small bowel wall thickening or dilatation. No evidence of obstruction. A normal appendix is visualized. No colonic dilatation or wall thickening. Scattered colonic diverticula without focal pericolonic inflammation to suggest diverticulitis.   Vascular/Lymphatic: Atherosclerotic plaque within the normal caliber aorta. No aneurysm or ectasia. No suspicious or enlarged lymph nodes in the included lymphatic chains.   Reproductive: Uterus is surgically absent. No concerning adnexal lesions.   Other: No abdominopelvic free fluid or free gas. No bowel containing  hernias. Soft tissue stranding of the anterior abdominal wall with multiple subcutaneous nodules likely related to injectable use. Additional injection granulomata are noted posteriorly.   Musculoskeletal:  Multilevel degenerative changes are present in the imaged portions of the spine. No acute osseous abnormality or suspicious osseous lesion.   IMPRESSION: No acute intra-abdominal process. No evidence of residual bowel obstruction.   Colonic diverticulosis without evidence of acute diverticulitis. Normal appendix.   Post cholecystectomy, hysterectomy   Aortic Atherosclerosis (ICD10-I70.0).     Electronically Signed   By: Lovena Le M.D.   On: 12/13/2018 15:32   CBC    Component Value Date/Time   WBC 5.3 12/13/2018 1347   RBC 3.89 12/13/2018 1347   HGB 8.7 (L) 12/13/2018 1347   HGB 10.3 (L) 01/29/2018 1315   HCT 31.1 (L) 12/13/2018 1347   HCT 33.8 (L) 01/29/2018 1315   PLT 216 12/13/2018 1347   PLT 275 01/29/2018 1315   MCV 79.9 (L) 12/13/2018 1347   MCV 75 (L) 01/29/2018 1315   MCH 22.4 (L) 12/13/2018 1347   MCHC 28.0 (L) 12/13/2018 1347   RDW 15.5 12/13/2018 1347   RDW 12.8 01/29/2018 1315   LYMPHSABS 1.3 12/13/2018 1347   LYMPHSABS 2.4 01/29/2018 1315   MONOABS 0.5 12/13/2018 1347   EOSABS 0.2 12/13/2018 1347   EOSABS 0.2 01/29/2018 1315   BASOSABS 0.0 12/13/2018 1347   BASOSABS 0.0 01/29/2018 1315   CMP     Component Value Date/Time   NA 142 12/13/2018 1347   NA 142 01/29/2018 1315   K 3.3 (L) 12/13/2018 1347   CL 110 12/13/2018 1347   CO2 24 12/13/2018 1347   GLUCOSE 174 (H) 12/13/2018 1347   BUN 17 12/13/2018 1347   BUN 29 (H) 01/29/2018 1315   CREATININE 0.97 12/13/2018 1347   CREATININE 1.24 (H) 09/30/2018 1143   CALCIUM 8.5 (L) 12/13/2018 1347   PROT 6.4 (L) 12/13/2018 1347   PROT 6.5 01/29/2018 1315   ALBUMIN 3.5 12/13/2018 1347   ALBUMIN 4.0 01/29/2018 1315   AST 23 12/13/2018 1347   ALT 23 12/13/2018 1347   ALKPHOS 80 12/13/2018 1347   BILITOT 0.3 12/13/2018 1347   BILITOT <0.2 01/29/2018 1315   GFRNONAA >60 12/13/2018 1347   GFRNONAA 45 (L) 09/30/2018 1143   GFRAA >60 12/13/2018 1347   GFRAA 52 (L) 09/30/2018 1143         Assessment & Plan:  68 year old female with a history of chronic constipation, colon polyps, colonic diverticulosis, pelvic floor dysfunction with history of rectocele and cystocele, GERD, prior cholecystectomy, history of SBO, sleep apnea, hypertension and depression who is here for follow-up.  1.  Chronic constipation exacerbated by narcotic pain medication use/history of SBO --constipation has improved with dual Amitiza and Linzess though at present it sounds like she is having stools that are too loose and too urgent.  We discussed her chronic constipation and how it is exacerbated by oxycodone.  She feels she needs this medication going forward so we will likely have to work around the pain medication. --Continue Amitiza 24 mcg twice daily --Stop Linzess for now to assess response.  If constipation returns we would add Linzess back at a lower dose, specifically 72 mcg daily --Her pelvic floor laxity/rectocele is contributing to her defecate tori dysfunction.  We discussed this today.  This could be corrected surgically which she is not interested in at present, which is understandable.  I offered pelvic floor physical therapy referral but due to transportation  issues she would like to hold off for now.  2.  GERD/nausea --GERD is controlled on AcipHex twice daily.  No dysphagia.  Her nausea is likely related to chronic narcotics which may also predispose her to gastroparesis in the setting of her diabetes. --Continue AcipHex 20 mg twice a day; we discussed reducing to once a day but she prefers twice daily --Zofran 4 to 8 mg every 8 hours as needed; we discussed how Zofran can exacerbate constipation however there is no other great option for her nausea.  Promethazine would likely be too sedating and metoclopramide has risk for additional concerning side effects  3.  History of adenomatous polyps --surveillance colonoscopy would be due 2022  60-month follow-up, sooner if needed  25 minutes spent  with the patient today. Greater than 50% was spent in counseling and coordination of care with the patient

## 2019-01-23 ENCOUNTER — Ambulatory Visit (INDEPENDENT_AMBULATORY_CARE_PROVIDER_SITE_OTHER): Payer: Medicare HMO | Admitting: Psychiatry

## 2019-01-23 DIAGNOSIS — F331 Major depressive disorder, recurrent, moderate: Secondary | ICD-10-CM | POA: Diagnosis not present

## 2019-01-23 NOTE — Progress Notes (Signed)
Virtual Visit via Telephone Note  I connected with [name] on 01/23/19 at 9:10 AM EST by telephone and verified that I am speaking with the correct person using two identifiers.   I discussed the limitations, risks, security and privacy concerns of performing an evaluation and management service by telephone and the availability of in person appointments. I also discussed with the patient that there may be a patient responsible charge related to this service. The patient expressed understanding and agreed to proceed.    I provided 40 minutes of non-face-to-face time during this encounter.   [name], LCSW              THERAPIST PROGRESS NOTE  Session Time: Thursday 01/23/2019 9:10 AM - 9:50 AM   Participation Level: Active  Behavioral Response: lethargic, depressed, anxious  Type of Therapy: Individual Therapy  Treatment Goals addressed: Learn and implement behavioral strategies to overcome depression.                                                       Verbalize an understanding and resolution of current interpersonal problems.   Interventions: CBT and Supportive  Summary: [name] is a 68 y.o. female who presents with  long standing history of recurrent periods  of depression beginning when she was 60 and her favorite uncle died. Patient reports multiple psychiatric hospitlaizations due to depression and suicidal ideations with the last one occuring in 1997. Patient has participated in outpatient psychotherapy and medication management intermittently since age 60. She currently is seeing psychiatrist Dr. Harrington Challenger . Prior to this, she was seen at [facility]. Patient also has had ECT at [facility]. Symptoms have worsened in recent months due to family stress and have  included depressed mood, anxiety, excessive worry, and tearfulness.   Patient's last contact was by virtual visit via telephone  3-4 weeks ago. She reports feeling better since taking Ativan as  prescribed by psychiatrist Dr. Harrington Challenger.  Patient says she feels more calm and less jittery.  She also reports improved mood and decreased worry and frustration regarding her niece and her son.  She reports deciding to focus on taking care of self rather than focusing on niece and son.  Although she still would like for them to be more involved in her care, she reports having more realistic expectations of themand expresses increased acceptance of the situation as it is.  She also reports being more assertive with niece and son regarding their tone of voice when they do interact with her.  Patient expresses relief that she does not have a nodule on her lung and expresses increased desire to quit smoking as she does not want to exacerbate any lung issues.  She states she has not smoked since this past Monday.  She reports she has received more support from a cousin who lives in [place] who has visited patient twice since last session.  She also reports continued positive relationship with her aide.    Suicidal/Homicidal: Nowithout intent/plan  Therapist Response: Reviewed symptoms, praised and reinforced patient choosing to have more realistic expectations and increased acceptance, praised and reinforced patient's increased use of assertiveness skills in the relationship with her niece and son, discussed effects, praised and reinforced patient's efforts to quit smoking, began to explore ways to maintain efforts, discussed  stressors, facilitated expression of thoughts and feelings, validated feelings,   Plan: Return again in 2 weeks.  Diagnosis: Axis I: MDD, Recurrent, moderate    Axis II: Deferred    Maddoxx Burkitt E Adelfa Lozito, LCSW

## 2019-01-24 ENCOUNTER — Other Ambulatory Visit: Payer: Self-pay

## 2019-01-24 ENCOUNTER — Other Ambulatory Visit (HOSPITAL_COMMUNITY)
Admission: RE | Admit: 2019-01-24 | Discharge: 2019-01-24 | Disposition: A | Discharge status: Home / Self Care | Payer: Medicare HMO | Source: Ambulatory Visit | Attending: Internal Medicine | Admitting: Internal Medicine

## 2019-01-24 DIAGNOSIS — Z01812 Encounter for preprocedural laboratory examination: Secondary | ICD-10-CM | POA: Diagnosis not present

## 2019-01-24 DIAGNOSIS — Z20828 Contact with and (suspected) exposure to other viral communicable diseases: Secondary | ICD-10-CM | POA: Diagnosis not present

## 2019-01-24 LAB — SARS CORONAVIRUS 2 (TAT 6-24 HRS): SARS Coronavirus 2: NEGATIVE

## 2019-01-28 ENCOUNTER — Other Ambulatory Visit (HOSPITAL_BASED_OUTPATIENT_CLINIC_OR_DEPARTMENT_OTHER): Payer: Medicare HMO | Admitting: Internal Medicine

## 2019-01-29 ENCOUNTER — Other Ambulatory Visit: Payer: Self-pay

## 2019-01-29 DIAGNOSIS — E785 Hyperlipidemia, unspecified: Secondary | ICD-10-CM | POA: Diagnosis not present

## 2019-01-29 DIAGNOSIS — G4733 Obstructive sleep apnea (adult) (pediatric): Secondary | ICD-10-CM | POA: Diagnosis not present

## 2019-01-29 NOTE — Patient Outreach (Signed)
[facility]) Care Management  01/29/2019  [name] September 27, 1950 [id number]   Telephone call to patient for follow up.  No answer. HIPAA compliant voice message left.  Plan: RN CM will attempt again within 4 days and send letter.   Jone Baseman, RN, MSN [place] Management Care Management Coordinator Direct Line [phone] Cell [phone] Toll Free: [phone]  Fax: [phone]

## 2019-01-30 ENCOUNTER — Other Ambulatory Visit: Payer: Self-pay

## 2019-01-30 NOTE — Patient Outreach (Signed)
[name]) Care Management  [place]  01/30/2019   [name] [birth year] [id number]  Subjective: Telephone call to patient for follow up. Patient reports she is doing ok.  She reports that her bowels are much better with less pain in her stomach. She states that her ongoing issue with her shoulder continues and that she is going to see Dr. Veverly Fells soon.  Encouraged patient to follow up with physicians as appropriate.  She verbalized understanding. Patient reports breathing in the green zone.  Patient discussed symptoms of active COPD and use of rescue inhaler and rest. Patient denies any needs at this time.    Objective:   Encounter Medications:  Outpatient Encounter Medications as of 01/30/2019  Medication Sig  . ACCU-CHEK AVIVA PLUS test strip CHECK BLOOD SUGAR FOUR TIMES DAILY  . albuterol (VENTOLIN HFA) 108 (90 Base) MCG/ACT inhaler Inhale 2 puffs into the lungs every 6 (six) hours as needed for wheezing or shortness of breath.  [name] amLODipine (NORVASC) 10 MG tablet Take 1 tablet (10 mg total) by mouth daily.  [name] ascorbic acid (VITAMIN C) 500 MG tablet Take 500 mg by mouth daily.  [name] aspirin EC 81 MG tablet Take 1 tablet (81 mg total) by mouth daily with breakfast.  . cetirizine (ZYRTEC) 10 MG tablet Take 1 tablet (10 mg total) by mouth daily.  . Cholecalciferol (VITAMIN D3) 5000 units CAPS Take 1 capsule by mouth daily.  . clobetasol cream (TEMOVATE) 2.26 % Apply 1 application topically 2 (two) times daily.   . cromolyn (NASALCROM) 5.2 MG/ACT nasal spray Place 1 spray into both nostrils 4 (four) times daily.  . diclofenac sodium (VOLTAREN) 1 % GEL APPLY 2 GRAMS  TOPICALLY 4 (FOUR) TIMES DAILY.  [name] insulin aspart (NOVOLOG FLEXPEN) 100 UNIT/ML FlexPen INJECT 10 TO 14 UNITS INTO THE SKIN 3 (THREE) TIMES DAILY WITH MEALS.  [name] Insulin Glargine (TOUJEO SOLOSTAR) 300 UNIT/ML SOPN Inject 20 Units into the skin at bedtime.  . lamoTRIgine (LAMICTAL) 100 MG tablet TAKE 1  TABLET TWICE DAILY  . levothyroxine (SYNTHROID) 50 MCG tablet Take 1 tablet (50 mcg total) by mouth daily before breakfast.  . linaclotide (LINZESS) 145 MCG CAPS capsule Take 1 capsule (145 mcg total) by mouth daily before breakfast.  . LORazepam (ATIVAN) 0.5 MG tablet Take 1 tablet (0.5 mg total) by mouth 3 (three) times daily.  [name] losartan (COZAAR) 50 MG tablet TAKE 1 TABLET EVERY DAY  . lubiprostone (AMITIZA) 24 MCG capsule Take 1 capsule (24 mcg total) by mouth 2 (two) times daily with a meal.  . metaxalone (SKELAXIN) 800 MG tablet metaxalone 800 mg tablet  take 1 tablet by mouth 3 times daily as needed.  . metoprolol tartrate (LOPRESSOR) 50 MG tablet TAKE 1 TABLET TWICE DAILY (NEED MD APPOINTMENT)  . montelukast (SINGULAIR) 10 MG tablet TAKE 1 TABLET EVERY DAY  . Multiple Vitamins-Minerals (HM MULTIVITAMIN ADULT GUMMY PO) Take 1 tablet by mouth daily.  [name] nystatin (MYCOSTATIN/NYSTOP) powder APPLY TO AFFECTED AREA 4 TIMES DAILY.  [name] Omega-3 Fatty Acids (FISH OIL PO) Take 1 capsule by mouth daily.  . ondansetron (ZOFRAN) 4 MG tablet Take 1 tablet (4 mg total) by mouth every 8 (eight) hours as needed for nausea or vomiting.  [name] oxyCODONE-acetaminophen (PERCOCET) 10-325 MG tablet Take 1 tablet by mouth every 8 (eight) hours as needed.   . pregabalin (LYRICA) 75 MG capsule Take 1 capsule (75 mg total) by mouth daily.  . RABEprazole (ACIPHEX) 20 MG  tablet TAKE 1 TABLET TWICE DAILY  . RESTASIS 0.05 % ophthalmic emulsion Place 1 drop into both eyes 2 (two) times daily.   . risperiDONE (RISPERDAL) 0.5 MG tablet TAKE 1 TABLET AT BEDTIME.  . rosuvastatin (CRESTOR) 5 MG tablet Take 1 tablet (5 mg total) by mouth at bedtime.  . sertraline (ZOLOFT) 100 MG tablet TAKE 1 TABLET EVERY DAY  . Tiotropium Bromide-Olodaterol (STIOLTO RESPIMAT) 2.5-2.5 MCG/ACT AERS Inhale 2 puffs into the lungs daily.  . traZODone (DESYREL) 100 MG tablet Take 1 tablet (100 mg total) by mouth at bedtime.   Facility-Administered  Encounter Medications as of 01/30/2019  Medication  . 0.9 %  sodium chloride infusion    Functional Status:  In your present state of health, do you have any difficulty performing the following activities: 01/15/2019 12/24/2018  Hearing? N N  Vision? N N  Difficulty concentrating or making decisions? N N  Walking or climbing stairs? Y Y  Comment some weakness weakness and fatigue  Dressing or bathing? Y N  Comment Needs assistance from aide -  Doing errands, shopping? Y N  Comment aide takes shopping -  Conservation officer, nature and eating ? Y N  Comment aide assists -  Using the Toilet? N N  In the past six months, have you accidently leaked urine? N N  Do you have problems with loss of bowel control? N N  Managing your Medications? N N  Managing your Finances? N N  Housekeeping or managing your Housekeeping? N N  Some recent data might be hidden    Fall/Depression Screening: Fall Risk  01/15/2019 12/24/2018 12/13/2018  Falls in the past year? 0 1 0  Comment - - -  Number falls in past yr: - 1 0  Comment - - -  Injury with Fall? - 0 0  Comment - - -  Risk Factor Category  - - -  Risk for fall due to : - History of fall(s);Impaired balance/gait;Impaired mobility;Orthopedic patient -  Follow up - - -   PHQ 2/9 Scores 01/15/2019 12/24/2018 12/13/2018 12/11/2018 10/10/2018 09/30/2018 09/04/2018  PHQ - 2 Score 2 2 0 3 2 3  0  PHQ- 9 Score 9 13 - 14 6 8  -  Some encounter information is confidential and restricted. Go to Review Flowsheets activity to see all data.    Assessment: Patient continues to manage chronic illnesses.    Plan:  [facility] CM Care Plan Problem One     Most Recent Value  Care Plan Problem One  Knowledge deficit related to recent hospitalization  Role Documenting the Problem One  Care Management Telephonic [place] for Problem One  Active  [facility] Long Term Goal   Patient will not have any readmissions or ED visits within the next 31 days.  [facility] Long Term Goal Start  Date  12/11/18  [facility] Long Term Goal Met Date  01/15/19  [facility] CM Short Term Goal #2   Patient will not have any exacerbations of COPD within the next 30 days.    [facility] CM Short Term Goal #2 Start Date  01/30/19  Interventions for Short Term Goal #2  Patient reports being in green zone. Patient able to describe signs of  acute COPD exacerbation.      [facility] CM Care Plan Problem Two     Most Recent Value  Care Plan Problem Two  Pain not effectively controlled  Role Documenting the Problem Two  Care Management Telephonic [place] for Problem  Two  Active  [facility] CM Short Term Goal #1   Patient will verbalize pain management strategies within 30 days.  [facility] CM Short Term Goal #1 Start Date  12/11/18  [facility] CM Short Term Goal #1 Met Date   01/15/19     RN CM will outreach patient again in the month of December. RN CM will send update to physician.    Jone Baseman, RN, MSN [place] Management Care Management Coordinator Direct Line [phone] Cell [phone] Toll Free: [phone]  Fax: [phone]

## 2019-02-03 ENCOUNTER — Encounter: Payer: Self-pay | Admitting: Family Medicine

## 2019-02-03 ENCOUNTER — Other Ambulatory Visit: Payer: Self-pay

## 2019-02-03 ENCOUNTER — Ambulatory Visit (INDEPENDENT_AMBULATORY_CARE_PROVIDER_SITE_OTHER): Payer: Medicare HMO | Admitting: Family Medicine

## 2019-02-03 ENCOUNTER — Ambulatory Visit: Payer: Self-pay

## 2019-02-03 VITALS — BP 128/64 | HR 89 | Temp 98.2°F | Resp 15 | Ht 59.0 in | Wt 168.0 lb

## 2019-02-03 DIAGNOSIS — E1142 Type 2 diabetes mellitus with diabetic polyneuropathy: Secondary | ICD-10-CM | POA: Diagnosis not present

## 2019-02-03 DIAGNOSIS — D320 Benign neoplasm of cerebral meninges: Secondary | ICD-10-CM | POA: Diagnosis not present

## 2019-02-03 DIAGNOSIS — R0989 Other specified symptoms and signs involving the circulatory and respiratory systems: Secondary | ICD-10-CM

## 2019-02-03 DIAGNOSIS — M7542 Impingement syndrome of left shoulder: Secondary | ICD-10-CM | POA: Diagnosis not present

## 2019-02-03 DIAGNOSIS — R296 Repeated falls: Secondary | ICD-10-CM

## 2019-02-03 DIAGNOSIS — K56699 Other intestinal obstruction unspecified as to partial versus complete obstruction: Secondary | ICD-10-CM | POA: Diagnosis not present

## 2019-02-03 DIAGNOSIS — D126 Benign neoplasm of colon, unspecified: Secondary | ICD-10-CM

## 2019-02-03 DIAGNOSIS — F3170 Bipolar disorder, currently in remission, most recent episode unspecified: Secondary | ICD-10-CM

## 2019-02-03 DIAGNOSIS — Z794 Long term (current) use of insulin: Secondary | ICD-10-CM

## 2019-02-03 DIAGNOSIS — I69354 Hemiplegia and hemiparesis following cerebral infarction affecting left non-dominant side: Secondary | ICD-10-CM | POA: Diagnosis not present

## 2019-02-03 DIAGNOSIS — M159 Polyosteoarthritis, unspecified: Secondary | ICD-10-CM | POA: Diagnosis not present

## 2019-02-03 DIAGNOSIS — R140 Abdominal distension (gaseous): Secondary | ICD-10-CM | POA: Diagnosis not present

## 2019-02-03 DIAGNOSIS — J449 Chronic obstructive pulmonary disease, unspecified: Secondary | ICD-10-CM | POA: Diagnosis not present

## 2019-02-03 DIAGNOSIS — E785 Hyperlipidemia, unspecified: Secondary | ICD-10-CM

## 2019-02-03 DIAGNOSIS — E559 Vitamin D deficiency, unspecified: Secondary | ICD-10-CM

## 2019-02-03 DIAGNOSIS — M5030 Other cervical disc degeneration, unspecified cervical region: Secondary | ICD-10-CM | POA: Diagnosis not present

## 2019-02-03 DIAGNOSIS — E1165 Type 2 diabetes mellitus with hyperglycemia: Secondary | ICD-10-CM

## 2019-02-03 DIAGNOSIS — Z0000 Encounter for general adult medical examination without abnormal findings: Secondary | ICD-10-CM

## 2019-02-03 NOTE — Patient Instructions (Addendum)
F/U in office with MD in 5 months, call if you need me sooner  Please schedule dexa at checkout.  Memory test in office today by nurse, and NO, I did not tell you that you have a 95% chance of getting dementia.  You will need your eye Specialist to prescribe the eye drop you are requesting , you say that it is new, so you do need to get the script form an eye Professional  Please get fasting lipid, cmp and EGr, hBA1C and vit D next week   Congrats on smoking cessation, keep it up   Fall Prevention in the Home, Adult Falls can cause injuries. They can happen to people of all ages. There are many things you can do to make your home safe and to help prevent falls. Ask for help when making these changes, if needed. What actions can I take to prevent falls? General Instructions  Use good lighting in all rooms. Replace any light bulbs that burn out.  Turn on the lights when you go into a dark area. Use night-lights.  Keep items that you use often in easy-to-reach places. Lower the shelves around your home if necessary.  Set up your furniture so you have a clear path. Avoid moving your furniture around.  Do not have throw rugs and other things on the floor that can make you trip.  Avoid walking on wet floors.  If any of your floors are uneven, fix them.  Add color or contrast paint or tape to clearly mark and help you see: ? Any grab bars or handrails. ? First and last steps of stairways. ? Where the edge of each step is.  If you use a stepladder: ? Make sure that it is fully opened. Do not climb a closed stepladder. ? Make sure that both sides of the stepladder are locked into place. ? Ask someone to hold the stepladder for you while you use it.  If there are any pets around you, be aware of where they are. What can I do in the bathroom?      Keep the floor dry. Clean up any water that spills onto the floor as soon as it happens.  Remove soap buildup in the tub or shower  regularly.  Use non-skid mats or decals on the floor of the tub or shower.  Attach bath mats securely with double-sided, non-slip rug tape.  If you need to sit down in the shower, use a plastic, non-slip stool.  Install grab bars by the toilet and in the tub and shower. Do not use towel bars as grab bars. What can I do in the bedroom?  Make sure that you have a light by your bed that is easy to reach.  Do not use any sheets or blankets that are too big for your bed. They should not hang down onto the floor.  Have a firm chair that has side arms. You can use this for support while you get dressed. What can I do in the kitchen?  Clean up any spills right away.  If you need to reach something above you, use a strong step stool that has a grab bar.  Keep electrical cords out of the way.  Do not use floor polish or wax that makes floors slippery. If you must use wax, use non-skid floor wax. What can I do with my stairs?  Do not leave any items on the stairs.  Make sure that you have a light  switch at the top of the stairs and the bottom of the stairs. If you do not have them, ask someone to add them for you.  Make sure that there are handrails on both sides of the stairs, and use them. Fix handrails that are broken or loose. Make sure that handrails are as long as the stairways.  Install non-slip stair treads on all stairs in your home.  Avoid having throw rugs at the top or bottom of the stairs. If you do have throw rugs, attach them to the floor with carpet tape.  Choose a carpet that does not hide the edge of the steps on the stairway.  Check any carpeting to make sure that it is firmly attached to the stairs. Fix any carpet that is loose or worn. What can I do on the outside of my home?  Use bright outdoor lighting.  Regularly fix the edges of walkways and driveways and fix any cracks.  Remove anything that might make you trip as you walk through a door, such as a raised  step or threshold.  Trim any bushes or trees on the path to your home.  Regularly check to see if handrails are loose or broken. Make sure that both sides of any steps have handrails.  Install guardrails along the edges of any raised decks and porches.  Clear walking paths of anything that might make someone trip, such as tools or rocks.  Have any leaves, snow, or ice cleared regularly.  Use sand or salt on walking paths during winter.  Clean up any spills in your garage right away. This includes grease or oil spills. What other actions can I take?  Wear shoes that: ? Have a low heel. Do not wear high heels. ? Have rubber bottoms. ? Are comfortable and fit you well. ? Are closed at the toe. Do not wear open-toe sandals.  Use tools that help you move around (mobility aids) if they are needed. These include: ? Canes. ? Walkers. ? Scooters. ? Crutches.  Review your medicines with your doctor. Some medicines can make you feel dizzy. This can increase your chance of falling. Ask your doctor what other things you can do to help prevent falls. Where to find more information  Centers for Disease Control and Prevention, STEADI: https://garcia.biz/  [employer] on Aging: BrainJudge.co.uk Contact a doctor if:  You are afraid of falling at home.  You feel weak, drowsy, or dizzy at home.  You fall at home. Summary  There are many simple things that you can do to make your home safe and to help prevent falls.  Ways to make your home safe include removing tripping hazards and installing grab bars in the bathroom.  Ask for help when making these changes in your home. This information is not intended to replace advice given to you by your health care provider. Make sure you discuss any questions you have with your health care provider. Document Released: 12/24/2008 Document Revised: 06/20/2018 Document Reviewed: 10/12/2016 Elsevier Patient Education  2020 [employer].

## 2019-02-04 ENCOUNTER — Encounter: Payer: Self-pay | Admitting: Adult Health

## 2019-02-04 ENCOUNTER — Ambulatory Visit (INDEPENDENT_AMBULATORY_CARE_PROVIDER_SITE_OTHER): Payer: Medicare HMO | Admitting: Adult Health

## 2019-02-04 VITALS — BP 120/60 | HR 67 | Temp 97.6°F | Ht 59.0 in | Wt 168.2 lb

## 2019-02-04 DIAGNOSIS — G40909 Epilepsy, unspecified, not intractable, without status epilepticus: Secondary | ICD-10-CM | POA: Diagnosis not present

## 2019-02-04 NOTE — Patient Instructions (Addendum)
Continue lamictal Ask to have lamical added lamictal added to next blood draw If you have any seizure events please let us know.

## 2019-02-04 NOTE — Progress Notes (Signed)
PATIENT: [name] DOB: 07-15-1950  REASON FOR VISIT: follow up HISTORY FROM: patient  HISTORY OF PRESENT ILLNESS: Today 02/04/19:  [name] is a 68 year old female with a history of seizures.  She returns today for follow-up.  She denies any seizure events.  She remains on Lamictal 100 mg twice a day.  Denies any changes with her gait or balance.  She requires help with her ADLs.  She has an aide that comes in and assist her.  She no longer operates a motor vehicle.  She reports that she has been receiving iron infusions.  She states that her hemoglobin, hematocrit and RBCs are low.  She returns today for an evaluation.  HISTORY 01/29/18:  [name] is a 68 year old female with a history of seizures.  She returns today for follow-up.  She is currently on Lamictal.  She is tolerating this medication well.  She denies any seizure events.  She continues to live at home alone.  She does have an aide that comes in and helps with ADLs.  She does not operate a motor vehicle.  She returns today for evaluation.  REVIEW OF SYSTEMS: Out of a complete 14 system review of symptoms, the patient complains only of the following symptoms, and all other reviewed systems are negative.  See HPI  ALLERGIES: Allergies  Allergen Reactions  . Cephalexin Hives  . Iron Nausea And Vomiting  . Milk-Related Compounds Other (See Comments)    Doesn't agree with stomach.   . Penicillins Hives    Has patient had a PCN reaction causing immediate rash, facial/tongue/throat swelling, SOB or lightheadedness with hypotension: Yes Has patient had a PCN reaction causing severe rash involving mucus membranes or skin necrosis: No Has patient had a PCN reaction that required hospitalization No Has patient had a PCN reaction occurring within the last 10 years: No If all of the above answers are "NO", then may proceed with Cephalosporin use.   [name] Phenazopyridine Hcl Hives          HOME MEDICATIONS: Outpatient  Medications Prior to Visit  Medication Sig Dispense Refill  . ACCU-CHEK AVIVA PLUS test strip CHECK BLOOD SUGAR FOUR TIMES DAILY 400 strip 11  . albuterol (VENTOLIN HFA) 108 (90 Base) MCG/ACT inhaler Inhale 2 puffs into the lungs every 6 (six) hours as needed for wheezing or shortness of breath. 18 g 0  . amLODipine (NORVASC) 10 MG tablet Take 1 tablet (10 mg total) by mouth daily. 90 tablet 3  . ascorbic acid (VITAMIN C) 500 MG tablet Take 500 mg by mouth daily.    [name] aspirin EC 81 MG tablet Take 1 tablet (81 mg total) by mouth daily with breakfast. 120 tablet 2  . cetirizine (ZYRTEC) 10 MG tablet Take 1 tablet (10 mg total) by mouth daily. 7 tablet 0  . Cholecalciferol (VITAMIN D3) 5000 units CAPS Take 1 capsule by mouth daily.    . clobetasol cream (TEMOVATE) [id number] % Apply 1 application topically 2 (two) times daily.     . cromolyn (NASALCROM) 5.2 MG/ACT nasal spray Place 1 spray into both nostrils 4 (four) times daily. 13 mL 1  . diclofenac sodium (VOLTAREN) 1 % GEL APPLY 2 GRAMS  TOPICALLY 4 (FOUR) TIMES DAILY. 200 g 1  . insulin aspart (NOVOLOG FLEXPEN) 100 UNIT/ML FlexPen INJECT 10 TO 14 UNITS INTO THE SKIN 3 (THREE) TIMES DAILY WITH MEALS. 45 mL 3  . Insulin Glargine (TOUJEO SOLOSTAR) 300 UNIT/ML SOPN Inject 20 Units into  the skin at bedtime. 9 pen 3  . lamoTRIgine (LAMICTAL) 100 MG tablet TAKE 1 TABLET TWICE DAILY 180 tablet 2  . levothyroxine (SYNTHROID) 50 MCG tablet Take 1 tablet (50 mcg total) by mouth daily before breakfast. 30 tablet 5  . linaclotide (LINZESS) 145 MCG CAPS capsule Take 1 capsule (145 mcg total) by mouth daily before breakfast. 30 capsule 6  . LORazepam (ATIVAN) 0.5 MG tablet Take 1 tablet (0.5 mg total) by mouth 3 (three) times daily. 270 tablet 2  . losartan (COZAAR) 50 MG tablet TAKE 1 TABLET EVERY DAY 90 tablet 1  . lubiprostone (AMITIZA) 24 MCG capsule Take 1 capsule (24 mcg total) by mouth 2 (two) times daily with a meal. 60 capsule 6  . metaxalone (SKELAXIN)  800 MG tablet metaxalone 800 mg tablet  take 1 tablet by mouth 3 times daily as needed.    . metoprolol tartrate (LOPRESSOR) 50 MG tablet TAKE 1 TABLET TWICE DAILY (NEED MD APPOINTMENT) 180 tablet 3  . montelukast (SINGULAIR) 10 MG tablet TAKE 1 TABLET EVERY DAY 90 tablet 1  . Multiple Vitamins-Minerals (HM MULTIVITAMIN ADULT GUMMY PO) Take 1 tablet by mouth daily.    [name] nystatin (MYCOSTATIN/NYSTOP) powder APPLY TO AFFECTED AREA 4 TIMES DAILY. 30 g 2  . Omega-3 Fatty Acids (FISH OIL PO) Take 1 capsule by mouth daily.    . ondansetron (ZOFRAN) 4 MG tablet Take 1 tablet (4 mg total) by mouth every 8 (eight) hours as needed for nausea or vomiting. 20 tablet 0  . oxyCODONE-acetaminophen (PERCOCET) 10-325 MG tablet Take 1 tablet by mouth every 8 (eight) hours as needed.     . pregabalin (LYRICA) 75 MG capsule Take 1 capsule (75 mg total) by mouth daily.    . RABEprazole (ACIPHEX) 20 MG tablet TAKE 1 TABLET TWICE DAILY 180 tablet 3  . RESTASIS 0.05 % ophthalmic emulsion Place 1 drop into both eyes 2 (two) times daily.     . risperiDONE (RISPERDAL) 0.5 MG tablet TAKE 1 TABLET AT BEDTIME. 90 tablet 2  . rosuvastatin (CRESTOR) 5 MG tablet Take 1 tablet (5 mg total) by mouth at bedtime. 90 tablet 2  . sertraline (ZOLOFT) 100 MG tablet TAKE 1 TABLET EVERY DAY 90 tablet 2  . Tiotropium Bromide-Olodaterol (STIOLTO RESPIMAT) 2.5-2.5 MCG/ACT AERS Inhale 2 puffs into the lungs daily. 12 g 3  . traZODone (DESYREL) 100 MG tablet Take 1 tablet (100 mg total) by mouth at bedtime. 90 tablet 2   Facility-Administered Medications Prior to Visit  Medication Dose Route Frequency Provider Last Rate Last Dose  . 0.9 %  sodium chloride infusion   Intravenous Continuous Lockamy, Randi L, NP-C   Stopped at 12/31/18 1542    PAST MEDICAL HISTORY: Past Medical History:  Diagnosis Date  . Allergy   . Anemia   . Anxiety    takes Ativan daily  . Arthritis   . Assistance needed for mobility   . Bipolar disorder ([place])     takes Risperdal nightly  . Blood transfusion   . Cancer ([place])    In her gum  . Carpal tunnel syndrome of right wrist 05/23/2011  . Cervical disc disorder with radiculopathy of cervical region 10/31/2012  . Chronic back pain   . Chronic idiopathic constipation   . Chronic neck and back pain   . Colon polyps   . COPD (chronic obstructive pulmonary disease) with chronic bronchitis ([place]) 09/16/2013   Office Spirometry 10/30/2013-submaximal effort based on appearance of loop and  curve. Numbers would fit with severe restriction but her physiologic capability may be better than this. FVC 0.91/44%, and 10.74/45%, FEV1/FVC 0.81, FEF 25-75% 1.43/69%    . Diabetes mellitus    Type II  . Diverticulosis    TCS 9/08 by Dr. Delfin Edis for diarrhea . Bx for micro scopic colitis negative.   . Fibromyalgia   . Frequent falls   . GERD (gastroesophageal reflux disease)    takes Aciphex daily  . Glaucoma    eye drops daily  . Gum symptoms    infection on antibiotic  . Hemiplegia affecting non-dominant side, post-stroke 08/02/2011  . Hiatal hernia   . Hyperlipidemia    takes Crestor daily  . Hypertension    takes Amlodipine,Metoprolol,and Clonidine daily  . Hypothyroidism    takes Synthroid daily  . IBS (irritable bowel syndrome)   . Insomnia    takes Trazodone nightly  . Major depression, recurrent ([place])    takes Zoloft daily  . 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 Metabolic encephalopathy [id number]  . Migraines    chronic headaches  . Mononeuritis lower limb   . Narcolepsy   . Osteoporosis   . Pancreatitis 2006   due to Depakote with normal EUS   . Schatzki's ring    non critical / EGD with ED 8/2011with RMR  . Seizures ([place])    takes Lamictal daily.Last seizure 3 yrs ago  . Sleep apnea    on CPAP  . Small bowel obstruction ([place])   . Stroke [facility])    left sided weakness, speech changes  . Tubular adenoma of colon     PAST SURGICAL HISTORY: Past Surgical History:  Procedure  Laterality Date  . ABDOMINAL HYSTERECTOMY  1978  . BACK SURGERY  July 2012  . BACTERIAL OVERGROWTH TEST N/A 05/05/2013   Procedure: BACTERIAL OVERGROWTH TEST;  Surgeon: Daneil Dolin, MD;  [facility];  Service: Endoscopy;  Laterality: N/A;  7:30  . BIOPSY THYROID  2009  . BRAIN SURGERY  11/2011   resection of meningioma  . BREAST REDUCTION SURGERY  1994  . CARDIAC CATHETERIZATION  05/10/2005   normal coronaries, normal LV systolic function and EF (Dr. Jackie Plum)  . CARPAL TUNNEL RELEASE Left 07/22/04   Dr. Aline Brochure  . CATARACT EXTRACTION Bilateral   . CHOLECYSTECTOMY  1984  . COLONOSCOPY N/A 09/25/2012   [id number] diverticulosis.  colonic polyp-removed : tubular adenoma  . CRANIOTOMY  11/23/2011   Procedure: CRANIOTOMY TUMOR EXCISION;  Surgeon: Hosie Spangle, MD;  Location: [facility] NEURO ORS;  Service: Neurosurgery;  Laterality: N/A;  Craniotomy for tumor resection  . ESOPHAGOGASTRODUODENOSCOPY  12/29/2010   Rourk-Retained food in the esophagus and stomach, small hiatal hernia, status post Maloney dilation of the esophagus  . ESOPHAGOGASTRODUODENOSCOPY N/A 09/25/2012   [id number] atonic baggy esophagus status post Maloney dilation 12 F. Hiatal hernia  . GIVENS CAPSULE STUDY N/A 01/15/2013   NORMAL.   . IR GENERIC HISTORICAL  03/17/2016   IR RADIOLOGIST EVAL & MGMT 03/17/2016 [facility]-INTERV RAD  . LESION REMOVAL N/A 05/31/2015   Procedure: REMOVAL RIGHT AND LEFT LESIONS OF MANDIBLE;  Surgeon: Diona Browner, DDS;  Location: [facility];  Service: Oral Surgery;  Laterality: N/A;  . MALONEY DILATION  12/29/2010   RMR;  . NM MYOCAR PERF WALL MOTION  2006   "relavtiely normal" persantine, mild anterior thinning (breast attenuation artifact), no region of scar/ischemia  . OVARIAN CYST REMOVAL    . RECTOCELE REPAIR N/A 06/29/2015   Procedure:  POSTERIOR REPAIR (RECTOCELE);  Surgeon: Jonnie Kind, MD;  Location: [facility];  Service: Gynecology;  Laterality: N/A;  . REDUCTION MAMMAPLASTY Bilateral    . SPINE SURGERY  09/29/2010   Dr. Rolena Infante  . surgical excision of 3 tumors from right thigh and right buttock  and left upper thigh  2010  . TOOTH EXTRACTION Bilateral 12/14/2014   Procedure: REMOVAL OF BILATERAL MANDIBULAR EXOSTOSES;  Surgeon: Diona Browner, DDS;  Location: [facility];  Service: Oral Surgery;  Laterality: Bilateral;  . TRANSTHORACIC ECHOCARDIOGRAM  2010   EF 60-65%, mild conc LVH, grade 1 diastolic dysfunction; mildly calcified MV annulus with mildly thickened leaflets, mildly calcified MR annulus    FAMILY HISTORY: Family History  Problem Relation Age of Onset  . Heart attack Mother        HTN  . Pneumonia Father   . Kidney failure Father   . Diabetes Father   . Pancreatic cancer Sister   . Diabetes Brother   . Hypertension Brother   . Diabetes Brother   . Cancer Sister        breast   . Hypertension Son   . Sleep apnea Son   . Cancer Sister        pancreatic  . Stroke Maternal Grandmother   . Heart attack Maternal Grandfather   . Alcohol abuse Maternal Uncle   . Colon cancer Neg Hx   . Anesthesia problems Neg Hx   . Hypotension Neg Hx   . Malignant hyperthermia Neg Hx   . Pseudochol deficiency Neg Hx   . Breast cancer Neg Hx     SOCIAL HISTORY: Social History   Socioeconomic History  . Marital status: Divorced    Spouse name: Not on file  . Number of children: 1  . Years of education: 36  . Highest education level: High school graduate  Occupational History  . Occupation: Disabled  Social Needs  . Financial resource strain: Not very hard  . Food insecurity    Worry: Never true    Inability: Never true  . Transportation needs    Medical: No    Non-medical: No  Tobacco Use  . Smoking status: Former Smoker    Packs/day: 0.50    Years: 30.00    Pack years: 15.00    Types: Cigarettes  . Smokeless tobacco: Never Used  . Tobacco comment: 5-10 cigs a day  Substance and Sexual Activity  . Alcohol use: No    Alcohol/week: 0.0 standard drinks     Comment:    . Drug use: No  . Sexual activity: Not Currently  Lifestyle  . Physical activity    Days per week: 0 days    Minutes per session: 0 min  . Stress: Only a little  Relationships  . Social connections    Talks on phone: More than three times a week    Gets together: Never    Attends religious service: 1 to 4 times per year    Active member of club or organization: No    Attends meetings of clubs or organizations: Never    Relationship status: Divorced  . Intimate partner violence    Fear of current or ex partner: No    Emotionally abused: No    Physically abused: No    Forced sexual activity: No  Other Topics Concern  . Not on file  Social History Narrative   01/29/18 Lives alone, has 3 aides, Mon- Fri 8 hrs, 2 hrs on Sat-Sun, [employer]  manages her meds   Caffeine use: Drink coffee sometimes    Right handed       PHYSICAL EXAM  Vitals:   02/04/19 1252  BP: 120/60  Pulse: 67  Temp: 97.6 F (36.4 C)  Weight: 168 lb 3.2 oz (76.3 kg)  Height: 4\' 11"  (1.499 m)   Body mass index is 33.97 kg/m.  Generalized: Well developed, in no acute distress   Neurological examination  Mentation: Alert oriented to time, place, history taking. Follows all commands speech and language fluent Cranial nerve II-XII: Pupils were equal round reactive to light. Extraocular movements were full, visual field were full on confrontational test.  Head turning and shoulder shrug  were normal and symmetric. Motor: The motor testing reveals 5 over 5 strength of all 4 extremities. Good symmetric motor tone is noted throughout.  Sensory: Sensory testing is intact to soft touch on all 4 extremities. No evidence of extinction is noted.  Coordination: Cerebellar testing reveals good finger-nose-finger and heel-to-shin bilaterally.  Gait and station: Gait is normal. Tandem gait is normal. Romberg is negative. No drift is seen.  Reflexes: Deep tendon reflexes are symmetric and normal bilaterally.    DIAGNOSTIC DATA (LABS, IMAGING, TESTING) - I reviewed patient records, labs, notes, testing and imaging myself where available.  Lab Results  Component Value Date   WBC 5.3 12/13/2018   HGB 8.7 (L) 12/13/2018   HCT 31.1 (L) 12/13/2018   MCV 79.9 (L) 12/13/2018   PLT 216 12/13/2018      Component Value Date/Time   NA 142 12/13/2018 1347   NA 142 01/29/2018 1315   K 3.3 (L) 12/13/2018 1347   CL 110 12/13/2018 1347   CO2 24 12/13/2018 1347   GLUCOSE 174 (H) 12/13/2018 1347   BUN 17 12/13/2018 1347   BUN 29 (H) 01/29/2018 1315   CREATININE 0.97 12/13/2018 1347   CREATININE 1.24 (H) 09/30/2018 1143   CALCIUM 8.5 (L) 12/13/2018 1347   PROT 6.4 (L) 12/13/2018 1347   PROT 6.5 01/29/2018 1315   ALBUMIN 3.5 12/13/2018 1347   ALBUMIN 4.0 01/29/2018 1315   AST 23 12/13/2018 1347   ALT 23 12/13/2018 1347   ALKPHOS 80 12/13/2018 1347   BILITOT 0.3 12/13/2018 1347   BILITOT <0.2 01/29/2018 1315   GFRNONAA >60 12/13/2018 1347   GFRNONAA 45 (L) 09/30/2018 1143   GFRAA >60 12/13/2018 1347   GFRAA 52 (L) 09/30/2018 1143   Lab Results  Component Value Date   CHOL 149 09/30/2018   HDL 43 (L) 09/30/2018   LDLCALC 79 09/30/2018   TRIG 173 (H) 09/30/2018   CHOLHDL 3.5 09/30/2018   Lab Results  Component Value Date   HGBA1C 5.4 09/30/2018   Lab Results  Component Value Date   VITAMINB12 1,616 (H) 10/28/2017   Lab Results  Component Value Date   TSH 0.82 09/30/2018      ASSESSMENT AND PLAN 68 y.o. year old female  has a past medical history of Allergy, Anemia, Anxiety, Arthritis, Assistance needed for mobility, Bipolar disorder ([place]), Blood transfusion, Cancer ([place]), Carpal tunnel syndrome of right wrist (05/23/2011), Cervical disc disorder with radiculopathy of cervical region (10/31/2012), Chronic back pain, Chronic idiopathic constipation, Chronic neck and back pain, Colon polyps, COPD (chronic obstructive pulmonary disease) with chronic bronchitis ([place]) (09/16/2013), Diabetes  mellitus, Diverticulosis, Fibromyalgia, Frequent falls, GERD (gastroesophageal reflux disease), Glaucoma, Gum symptoms, Hemiplegia affecting non-dominant side, post-stroke (08/02/2011), Hiatal hernia, Hyperlipidemia, Hypertension, Hypothyroidism, IBS (irritable bowel syndrome), Insomnia, Major depression, recurrent ([place]), Malignant  hyperpyrexia (99991111), Metabolic encephalopathy (99991111), Migraines, Mononeuritis lower limb, Narcolepsy, Osteoporosis, Pancreatitis (2006), Schatzki's ring, Seizures ([place]), Sleep apnea, Small bowel obstruction ([place]), Stroke ([place]), and Tubular adenoma of colon. here with:  1.  Seizures  Overall the patient is doing well.  She will continue on Lamictal 100 mg twice a day.  I will check drug levels today.  She is advised that if her symptoms worsen or she develops new symptoms she should let us know.  She will follow-up in 1 year or sooner if needed     Ward Givens, MSN, NP-C 02/04/2019, 12:55 PM [facility] [address], [place], [place] [address] [phone]

## 2019-02-04 NOTE — Progress Notes (Signed)
I have read the note, and I agree with the clinical assessment and plan.  [name]   

## 2019-02-07 ENCOUNTER — Other Ambulatory Visit: Payer: Self-pay | Admitting: Physician Assistant

## 2019-02-09 DIAGNOSIS — D126 Benign neoplasm of colon, unspecified: Secondary | ICD-10-CM | POA: Insufficient documentation

## 2019-02-09 NOTE — Assessment & Plan Note (Signed)
Currently depressed, not suicidal or homicidal. Managed by Psychiatry

## 2019-02-09 NOTE — Assessment & Plan Note (Signed)
Annual exam as documented. Counseling done  re healthy lifestyle involving commitment to 150 minutes exercise per week, heart healthy diet, and attaining healthy weight.The importance of adequate sleep also discussed. Regular seat belt use and home safety, is also discussed. Changes in health habits are decided on by the patient with goals and time frames  set for achieving them. Immunization and cancer screening needs are specifically addressed at this visit.  

## 2019-02-09 NOTE — Assessment & Plan Note (Signed)
Most recent fall on day of visit in bath tub. Home safety reviewed, also requested that she wait on bathing until her Aide is present , was in the bath tub at 4 am on day of visit. No visble or reported significant trauma

## 2019-02-09 NOTE — Assessment & Plan Note (Signed)
No c/o change in stool caliber or pattern, no abdominal pain, will refer in [address] when indicated

## 2019-02-09 NOTE — Progress Notes (Signed)
    [name]     MRN: [id number]      DOB: 1950-11-24  HPI: Patient is in for annual physical exam. C/o fall in tub this morning, landed on left  side , chronic left shoulder is increased today, however denies bruising or laceration, no head trauma  PE: BP 128/64   Pulse 89   Temp 98.2 F (36.8 C) (Temporal)   Resp 15   Ht 4\' 11"  (1.499 m)   Wt 168 lb (76.2 kg)   SpO2 95%   BMI 33.93 kg/m   Pleasant  female, alert and oriented x 3, in no cardio-pulmonary distress. Afebrile. HEENT No facial trauma or asymetry. Sinuses non tender.  Extra occullar muscles intact.. External ears normal, . Neck:decreased ROM, no adenopathy,JVD or thyromegaly.No bruits.  Chest: Clear to ascultation bilaterally.No crackles or wheezes. Non tender to palpation  Breast: No physical exam in office , normal mammogram in 11/2018  Cardiovascular system; Heart sounds normal,  S1 and  S2 ,no S3.  No murmur, or thrill. Apical beat not displaced Peripheral pulses normal.  Abdomen: Soft, non tender,No guarding, tenderness or rebound.      Musculoskeletal exam: Decreased  ROM of spine, and  shoulders normal in  Hips  And knees. No deformity ,swelling or crepitus noted.  muscle wasting noted   Neurologic: Cranial nerves 2 to 12 intact. Power, tone ,sensation normal throughout. Abnormal  gait. No tremor.  Skin: Intact, no ulceration, erythema , scaling or rash noted. Pigmentation normal throughout  Psych; Depressed  mood and sad affect. Judgement and concentration normal   Assessment & Plan:  Annual physical exam Annual exam as documented. Counseling done  re healthy lifestyle involving commitment to 150 minutes exercise per week, heart healthy diet, and attaining healthy weight.The importance of adequate sleep also discussed. Regular seat belt use and home safety, is also discussed. Changes in health habits are decided on by the patient with goals and time frames  set for achieving  them. Immunization and cancer screening needs are specifically addressed at this visit.   Tubular adenoma of colon No c/o change in stool caliber or pattern, no abdominal pain, will refer in [address] when indicated  Recurrent falls Most recent fall on day of visit in bath tub. Home safety reviewed, also requested that she wait on bathing until her Aide is present , was in the bath tub at 4 am on day of visit. No visble or reported significant trauma  Bipolar disorder ([place]) Currently depressed, not suicidal or homicidal. Managed by Psychiatry

## 2019-02-11 DIAGNOSIS — I10 Essential (primary) hypertension: Secondary | ICD-10-CM | POA: Diagnosis not present

## 2019-02-11 DIAGNOSIS — J449 Chronic obstructive pulmonary disease, unspecified: Secondary | ICD-10-CM | POA: Diagnosis not present

## 2019-02-12 ENCOUNTER — Ambulatory Visit (INDEPENDENT_AMBULATORY_CARE_PROVIDER_SITE_OTHER): Payer: Medicare HMO | Admitting: Psychiatry

## 2019-02-12 ENCOUNTER — Other Ambulatory Visit: Payer: Self-pay

## 2019-02-12 ENCOUNTER — Telehealth: Payer: Self-pay

## 2019-02-12 DIAGNOSIS — Z794 Long term (current) use of insulin: Secondary | ICD-10-CM

## 2019-02-12 DIAGNOSIS — I10 Essential (primary) hypertension: Secondary | ICD-10-CM

## 2019-02-12 DIAGNOSIS — F331 Major depressive disorder, recurrent, moderate: Secondary | ICD-10-CM

## 2019-02-12 DIAGNOSIS — E785 Hyperlipidemia, unspecified: Secondary | ICD-10-CM

## 2019-02-12 DIAGNOSIS — M150 Primary generalized (osteo)arthritis: Secondary | ICD-10-CM | POA: Diagnosis not present

## 2019-02-12 DIAGNOSIS — E119 Type 2 diabetes mellitus without complications: Secondary | ICD-10-CM | POA: Diagnosis not present

## 2019-02-12 DIAGNOSIS — E559 Vitamin D deficiency, unspecified: Secondary | ICD-10-CM

## 2019-02-12 DIAGNOSIS — R32 Unspecified urinary incontinence: Secondary | ICD-10-CM | POA: Diagnosis not present

## 2019-02-12 DIAGNOSIS — E1165 Type 2 diabetes mellitus with hyperglycemia: Secondary | ICD-10-CM | POA: Diagnosis not present

## 2019-02-12 DIAGNOSIS — I639 Cerebral infarction, unspecified: Secondary | ICD-10-CM | POA: Diagnosis not present

## 2019-02-12 NOTE — Progress Notes (Signed)
Virtual Visit via Telephone Note  I connected with [name] on 02/12/19 at 10:05 AM EST   by telephone and verified that I am speaking with the correct person using two identifiers.   I discussed the limitations, risks, security and privacy concerns of performing an evaluation and management service by telephone and the availability of in person appointments. I also discussed with the patient that there may be a patient responsible charge related to this service. The patient expressed understanding and agreed to proceed.    I provided 35 minutes of non-face-to-face time during this encounter.   [name] Smoker, LCSW             THERAPIST PROGRESS NOTE  Session Time: Wednesday 02/12/2019 10:05 AM - 10:45 AM   Participation Level: Active  Behavioral Response: lethargic, depressed, anxious  Type of Therapy: Individual Therapy  Treatment Goals addressed: Learn and implement behavioral strategies to overcome depression.                                                       Verbalize an understanding and resolution of current interpersonal problems.   Interventions: CBT and Supportive  Summary: [name] is a 68 y.o. female who presents with  long standing history of recurrent periods  of depression beginning when she was 74 and her favorite uncle died. Patient reports multiple psychiatric hospitlaizations due to depression and suicidal ideations with the last one occuring in 1997. Patient has participated in outpatient psychotherapy and medication management intermittently since age 44. She currently is seeing psychiatrist Dr. Harrington Challenger . Prior to this, she was seen at [facility]. Patient also has had ECT at [facility]. Symptoms have worsened in recent months due to family stress and have  included depressed mood, anxiety, excessive worry, and tearfulness.   Patient's last contact was by virtual visit via telephone  3-4 weeks ago. She reports having mood swings at times.  She reports  having conflict with a cousin as well as a friend.  She reports she was assertive and not aggressive.  She also reports setting limits.  She expresses frustration regarding lack of more contact and support from son but continues to express increased acceptance of the relationship as it is.  She reports she has been a little more irritable at times as she continues her efforts to maintain abstinence from cigarettes.  She is pleased that she has not smoked a cigarette in a month.  She reports positive relationship with her aide and is thankful she sees her daily.  She reports sadness mainly occurs at night when she is alone.    Suicidal/Homicidal: Nowithout intent/plan  Therapist Response: Reviewed symptoms, praised and reinforced patient's continued efforts to maintain abstinence from cigarettes, praised and reinforced patient's use of assertiveness skills, discussed connection between thoughts and mood and behavior, assisted patient identify coping statements to manage sad feelings in the evenings, encourage patient to participate in activity including watching TV and reading, assigned patient to do a daily gratitude list.   Plan: Return again in 2 weeks.  Diagnosis: Axis I: MDD, Recurrent, moderate    Axis II: Deferred    [name] Trammel E Yanelli Zapanta, LCSW

## 2019-02-12 NOTE — Telephone Encounter (Signed)
Labs ordered through [facility]

## 2019-02-13 ENCOUNTER — Other Ambulatory Visit (HOSPITAL_COMMUNITY): Payer: Medicare HMO

## 2019-02-13 ENCOUNTER — Other Ambulatory Visit: Payer: Self-pay

## 2019-02-13 ENCOUNTER — Ambulatory Visit (HOSPITAL_COMMUNITY)
Admission: RE | Admit: 2019-02-13 | Discharge: 2019-02-13 | Disposition: A | Discharge status: Home / Self Care | Payer: Medicare HMO | Source: Ambulatory Visit | Attending: Family Medicine | Admitting: Family Medicine

## 2019-02-13 DIAGNOSIS — M810 Age-related osteoporosis without current pathological fracture: Secondary | ICD-10-CM | POA: Diagnosis not present

## 2019-02-13 DIAGNOSIS — M818 Other osteoporosis without current pathological fracture: Secondary | ICD-10-CM | POA: Diagnosis not present

## 2019-02-13 DIAGNOSIS — Z780 Asymptomatic menopausal state: Secondary | ICD-10-CM | POA: Diagnosis not present

## 2019-02-13 DIAGNOSIS — Z13820 Encounter for screening for osteoporosis: Secondary | ICD-10-CM | POA: Diagnosis not present

## 2019-02-13 DIAGNOSIS — M85851 Other specified disorders of bone density and structure, right thigh: Secondary | ICD-10-CM | POA: Diagnosis not present

## 2019-02-14 LAB — CMP14+EGFR
ALT: 15 IU/L (ref 0–32)
AST: 17 IU/L (ref 0–40)
Albumin/Globulin Ratio: 1.7 (ref 1.2–2.2)
Albumin: 4.3 g/dL (ref 3.8–4.8)
Alkaline Phosphatase: 129 IU/L — ABNORMAL HIGH (ref 39–117)
BUN/Creatinine Ratio: 32 — ABNORMAL HIGH (ref 12–28)
BUN: 32 mg/dL — ABNORMAL HIGH (ref 8–27)
Bilirubin Total: 0.2 mg/dL (ref 0.0–1.2)
CO2: 21 mmol/L (ref 20–29)
Calcium: 9.5 mg/dL (ref 8.7–10.3)
Chloride: 108 mmol/L — ABNORMAL HIGH (ref 96–106)
Creatinine, Ser: 1 mg/dL (ref 0.57–1.00)
GFR calc Af Amer: 67 mL/min/{1.73_m2} (ref >59)
GFR calc non Af Amer: 58 mL/min/{1.73_m2} — ABNORMAL LOW (ref >59)
Globulin, Total: 2.5 g/dL (ref 1.5–4.5)
Glucose: 167 mg/dL — ABNORMAL HIGH (ref 65–99)
Potassium: 4.3 mmol/L (ref 3.5–5.2)
Sodium: 146 mmol/L — ABNORMAL HIGH (ref 134–144)
Total Protein: 6.8 g/dL (ref 6.0–8.5)

## 2019-02-14 LAB — LIPID PANEL WITH LDL/HDL RATIO
Cholesterol, Total: 187 mg/dL (ref 100–199)
HDL: 46 mg/dL (ref >39)
LDL Chol Calc (NIH): 106 mg/dL — ABNORMAL HIGH (ref 0–99)
LDL/HDL Ratio: 2.3 ratio (ref 0.0–3.2)
Triglycerides: 202 mg/dL — ABNORMAL HIGH (ref 0–149)
VLDL Cholesterol Cal: 35 mg/dL (ref 5–40)

## 2019-02-14 LAB — VITAMIN D 25 HYDROXY (VIT D DEFICIENCY, FRACTURES): Vit D, 25-Hydroxy: 48.2 ng/mL (ref 30.0–100.0)

## 2019-02-14 LAB — HEMOGLOBIN A1C
Est. average glucose Bld gHb Est-mCnc: 123 mg/dL
Hgb A1c MFr Bld: 5.9 % — ABNORMAL HIGH (ref 4.8–5.6)

## 2019-02-15 DIAGNOSIS — I10 Essential (primary) hypertension: Secondary | ICD-10-CM | POA: Diagnosis not present

## 2019-02-15 DIAGNOSIS — Z9181 History of falling: Secondary | ICD-10-CM | POA: Diagnosis not present

## 2019-02-15 DIAGNOSIS — M179 Osteoarthritis of knee, unspecified: Secondary | ICD-10-CM | POA: Diagnosis not present

## 2019-02-17 ENCOUNTER — Ambulatory Visit (INDEPENDENT_AMBULATORY_CARE_PROVIDER_SITE_OTHER): Payer: Medicare HMO | Admitting: Internal Medicine

## 2019-02-17 ENCOUNTER — Encounter: Payer: Self-pay | Admitting: Internal Medicine

## 2019-02-17 ENCOUNTER — Other Ambulatory Visit: Payer: Self-pay

## 2019-02-17 VITALS — BP 180/80 | HR 82 | Ht 59.0 in | Wt 169.0 lb

## 2019-02-17 DIAGNOSIS — Z794 Long term (current) use of insulin: Secondary | ICD-10-CM

## 2019-02-17 DIAGNOSIS — E039 Hypothyroidism, unspecified: Secondary | ICD-10-CM | POA: Diagnosis not present

## 2019-02-17 DIAGNOSIS — E1165 Type 2 diabetes mellitus with hyperglycemia: Secondary | ICD-10-CM | POA: Diagnosis not present

## 2019-02-17 DIAGNOSIS — I69354 Hemiplegia and hemiparesis following cerebral infarction affecting left non-dominant side: Secondary | ICD-10-CM | POA: Diagnosis not present

## 2019-02-17 DIAGNOSIS — E042 Nontoxic multinodular goiter: Secondary | ICD-10-CM | POA: Diagnosis not present

## 2019-02-17 DIAGNOSIS — M7542 Impingement syndrome of left shoulder: Secondary | ICD-10-CM | POA: Diagnosis not present

## 2019-02-17 DIAGNOSIS — R140 Abdominal distension (gaseous): Secondary | ICD-10-CM | POA: Diagnosis not present

## 2019-02-17 DIAGNOSIS — K56699 Other intestinal obstruction unspecified as to partial versus complete obstruction: Secondary | ICD-10-CM | POA: Diagnosis not present

## 2019-02-17 DIAGNOSIS — E1159 Type 2 diabetes mellitus with other circulatory complications: Secondary | ICD-10-CM | POA: Diagnosis not present

## 2019-02-17 DIAGNOSIS — E1142 Type 2 diabetes mellitus with diabetic polyneuropathy: Secondary | ICD-10-CM | POA: Diagnosis not present

## 2019-02-17 DIAGNOSIS — D320 Benign neoplasm of cerebral meninges: Secondary | ICD-10-CM | POA: Diagnosis not present

## 2019-02-17 DIAGNOSIS — J449 Chronic obstructive pulmonary disease, unspecified: Secondary | ICD-10-CM | POA: Diagnosis not present

## 2019-02-17 DIAGNOSIS — M159 Polyosteoarthritis, unspecified: Secondary | ICD-10-CM | POA: Diagnosis not present

## 2019-02-17 DIAGNOSIS — M5030 Other cervical disc degeneration, unspecified cervical region: Secondary | ICD-10-CM | POA: Diagnosis not present

## 2019-02-17 DIAGNOSIS — E1143 Type 2 diabetes mellitus with diabetic autonomic (poly)neuropathy: Secondary | ICD-10-CM | POA: Diagnosis not present

## 2019-02-17 MED ORDER — TOUJEO MAX SOLOSTAR 300 UNIT/ML ~~LOC~~ SOPN: 25.0000 [IU] | PEN_INJECTOR | Freq: Every day | SUBCUTANEOUS | 3 refills | Status: DC

## 2019-02-17 MED ORDER — NOVOLOG FLEXPEN 100 UNIT/ML ~~LOC~~ SOPN: PEN_INJECTOR | SUBCUTANEOUS | 3 refills | Status: DC

## 2019-02-17 MED ORDER — ACCU-CHEK SOFT TOUCH LANCETS MISC: 12 refills | Status: DC

## 2019-02-17 NOTE — Patient Instructions (Addendum)
Please continue - Toujeo 25 units at bedtime - Novolog: 10-14 units before meals  Please continue levothyroxine 50 mcg Daily.  Please stop at the lab.  Please return in 3-4 months.

## 2019-02-17 NOTE — Progress Notes (Addendum)
Patient ID: SAHEJ HOLLING, female   DOB: 11-21-1950, 68 y.o.   MRN: [id number]  This visit occurred during the SARS-CoV-2 public health emergency.  Safety protocols were in place, including screening questions prior to the visit, additional usage of staff PPE, and extensive cleaning of exam room while observing appropriate contact time as indicated for disinfecting solutions.    HPI: JERRIE WINDISH is a 68 y.o.-year-old female, initially referred by her PCP, [name], presenting for follow-up for DM2, dx 1995, insulin-dependent since 1997, uncontrolled, with complications (gastroparesis, cerebrovasc. Ds-  H/o stroke, PN),  Hypothyroidism, thyroid nodules.  Last visit 6 months ago (virtual)  In the past, sugars were higher as she was on prednisone (started 06/11/2018) for elbow and arm pain and ABx for a sinus infection. The courses lasted for 5 days.  An MRI showed torn tendons in shoulder-will have surgery.  DM2: Reviewed HbA1c levels: Lab Results  Component Value Date   HGBA1C 5.9 (H) 02/12/2019   HGBA1C 5.4 09/30/2018   HGBA1C 5.1 02/18/2018  10/28/2013: HbA1c 8.5%  She is on: - Toujeo 23 >> 25 units at bedtime - Novolog: 10 to 14 units before a meal >> apparently, she takes this after the meals Could not tolerate Metformin >> nausea.   Insulin doses were decreased in 04/2017 due to low blood sugars in the 40s.  Pt checks her sugars 2-4 times a day: - am: 80-141, 425 >> 91, 124, 160-177, 215 >> 84-144, 230 >> 196 - 2h after b'fast: n/c >> 110 >> n/c - before lunch:  111, 136-196, 215, 245 >> 123-149, 174, 198 >> n/c - 2h after lunch: 127-154, 229 >> n/c >> 115 >> n/c >> 127, 215, 241 - before dinner: 89-151 >> 134 >> 89, 123-198, 222, 234 >> 149-289 - 2h after dinner: n/c - bedtime: 102-151, 166 >> 129, 155, 156 >> 146-219, 240, 249 >>169-244 - nighttime: n/c >> 134 Lowest sugar was 65 >> 89 >> 84 >> 79; she has hypoglycemia awareness in the 70s. Highest sugar was 425 >> 291 >>  249 >> 241.  Pt's meals are: - Breakfast: eggs, cereals, fruit, oatmeal, bacon - Lunch: sandwich, beef, mashed potatoes,diet soda - Dinner: chicken, beef, fish, potatoes, vegetables - Snacks: 1-2: fruit  -+ Mild CKD, last BUN/creatinine:  Lab Results  Component Value Date   BUN 32 (H) 02/12/2019   CREATININE 1.00 02/12/2019  On losartan. -+ HL; last set of lipids: Lab Results  Component Value Date   CHOL 187 02/12/2019   HDL 46 02/12/2019   LDLCALC 106 (H) 02/12/2019   TRIG 202 (H) 02/12/2019   CHOLHDL 3.5 09/30/2018  On Crestor and Zetia. - last eye exam was in 2020: + DR; Had cataract sx B. Dr. Hassell Done [facility]). -+ Numbness and tingling mainly in the left leg- affected by stroke. Sees podiatry.  Hypothyroidism: -Diagnosed many years ago  Pt is on levothyroxine 50 mcg 6/7 days and was previously on 25 mcg 1/7 days, but this was changed to 50 mcg daily since her hospitalization in 11/2018: - in am - fasting - at least 30 min from b'fast - no Ca, Fe, PPIs - + Multivitamins with lunch, Aciphex with lunch and dinner - + Biotin in B complex  Latest TSH was normal: Lab Results  Component Value Date   TSH 0.82 09/30/2018   TSH 0.52 10/08/2017   TSH 0.827 10/15/2016   TSH 0.906 01/22/2016   TSH 1.030 11/03/2015   Thyroid nodules.  Pt denies: - feeling nodules in neck - hoarseness - dysphagia - choking - SOB with lying down  She has chronic dysphagia with certain foods.  She had previous esophageal dilations.  Reviewed her imaging and biopsy tests: thyroid U/S (10/2013):  Right thyroid lobe: 3.7 x 1.0 x 1.8 cm   Left thyroid lobe: 3.5 x 1.5 x 1.6 cm   Isthmus: 0.5 cm   Focal nodules: There is a 0.3 mm calcified nodule in the right midzone. There is a 0.6 x 0.7 x 0.5 cm hypoechoic nodule in the  lateral aspect of the right midzone. There is a 0.7 x 0.6 x 0.5 cm hyperechoic nodule in the lateral aspect of the right lower pole.  There is a 2.2 x 1.1 x 1.7 cm  complex solid nodule in the inferior aspect of the isthmus extending adjacent to the left lower pole.  There is a 1.4 x 2.1 x 1.4 cm complex solid nodule in the left lower pole.   Lymphadenopathy: None visualized.  Repeat U/S (01/2014): Stable appearance of the nodules  FNA (2014) x2: Benign  Repeat U/S (10/2015): stable appearance of the nodules  Repeat U/S (10/2016): Stable appearance of the nodules  She had a L rib fracture in 12/2014.  Patient was admitted with AMS + sepsis on 10/15/2016.  She had aspiration pneumonia and acute respiratory failure  She has a recurrent meningioma >> seeing Dr. Rita Ohara.  ROS: Constitutional: no weight gain/no weight loss, no fatigue, no subjective hyperthermia, no subjective hypothermia Eyes: no blurry vision, no xerophthalmia ENT: no sore throat, + see HPI Cardiovascular: no CP/no SOB/no palpitations/no leg swelling Respiratory: no cough/no SOB/no wheezing Gastrointestinal: no N/no V/no D/no C/no acid reflux Musculoskeletal: no muscle aches/no joint aches Skin: no rashes, no hair loss Neurological: no tremors/+ numbness/+ tingling/no dizziness  I reviewed pt's medications, allergies, PMH, social hx, family hx, and changes were documented in the history of present illness. Otherwise, unchanged from my initial visit note.  Past Medical History:  Diagnosis Date  . Allergy   . Anemia   . Anxiety    takes Ativan daily  . Arthritis   . Assistance needed for mobility   . Bipolar disorder ([place])    takes Risperdal nightly  . Blood transfusion   . Cancer ([place])    In her gum  . Carpal tunnel syndrome of right wrist 05/23/2011  . Cervical disc disorder with radiculopathy of cervical region 10/31/2012  . Chronic back pain   . Chronic idiopathic constipation   . Chronic neck and back pain   . Colon polyps   . COPD (chronic obstructive pulmonary disease) with chronic bronchitis ([place]) 09/16/2013   Office Spirometry 10/30/2013-submaximal effort  based on appearance of loop and curve. Numbers would fit with severe restriction but her physiologic capability may be better than this. FVC 0.91/44%, and 10.74/45%, FEV1/FVC 0.81, FEF 25-75% 1.43/69%    . Diabetes mellitus    Type II  . Diverticulosis    TCS 9/08 by Dr. Delfin Edis for diarrhea . Bx for micro scopic colitis negative.   . Fibromyalgia   . Frequent falls   . GERD (gastroesophageal reflux disease)    takes Aciphex daily  . Glaucoma    eye drops daily  . Gum symptoms    infection on antibiotic  . Hemiplegia affecting non-dominant side, post-stroke 08/02/2011  . Hiatal hernia   . Hyperlipidemia    takes Crestor daily  . Hypertension    takes Amlodipine,Metoprolol,and Clonidine daily  .  Hypothyroidism    takes Synthroid daily  . IBS (irritable bowel syndrome)   . Insomnia    takes Trazodone nightly  . Major depression, recurrent ([place])    takes Zoloft daily  . 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 Metabolic encephalopathy [id number]  . Migraines    chronic headaches  . Mononeuritis lower limb   . Narcolepsy   . Osteoporosis   . Pancreatitis 2006   due to Depakote with normal EUS   . Schatzki's ring    non critical / EGD with ED 8/2011with RMR  . Seizures ([place])    takes Lamictal daily.Last seizure 3 yrs ago  . Sleep apnea    on CPAP  . Small bowel obstruction ([place])   . Stroke [facility])    left sided weakness, speech changes  . Tubular adenoma of colon    Past Surgical History:  Procedure Laterality Date  . ABDOMINAL HYSTERECTOMY  1978  . BACK SURGERY  July 2012  . BACTERIAL OVERGROWTH TEST N/A 05/05/2013   Procedure: BACTERIAL OVERGROWTH TEST;  Surgeon: Daneil Dolin, MD;  [facility];  Service: Endoscopy;  Laterality: N/A;  7:30  . BIOPSY THYROID  2009  . BRAIN SURGERY  11/2011   resection of meningioma  . BREAST REDUCTION SURGERY  1994  . CARDIAC CATHETERIZATION  05/10/2005   normal coronaries, normal LV systolic function and EF (Dr. Jackie Plum)   . CARPAL TUNNEL RELEASE Left 07/22/04   Dr. Aline Brochure  . CATARACT EXTRACTION Bilateral   . CHOLECYSTECTOMY  1984  . COLONOSCOPY N/A 09/25/2012   [id number] diverticulosis.  colonic polyp-removed : tubular adenoma  . CRANIOTOMY  11/23/2011   Procedure: CRANIOTOMY TUMOR EXCISION;  Surgeon: Hosie Spangle, MD;  Location: [place] NEURO ORS;  Service: Neurosurgery;  Laterality: N/A;  Craniotomy for tumor resection  . ESOPHAGOGASTRODUODENOSCOPY  12/29/2010   Rourk-Retained food in the esophagus and stomach, small hiatal hernia, status post Maloney dilation of the esophagus  . ESOPHAGOGASTRODUODENOSCOPY N/A 09/25/2012   [id number] atonic baggy esophagus status post Maloney dilation 60 F. Hiatal hernia  . GIVENS CAPSULE STUDY N/A 01/15/2013   NORMAL.   . IR GENERIC HISTORICAL  03/17/2016   IR RADIOLOGIST EVAL & MGMT 03/17/2016 [facility]-INTERV RAD  . LESION REMOVAL N/A 05/31/2015   Procedure: REMOVAL RIGHT AND LEFT LESIONS OF MANDIBLE;  Surgeon: Diona Browner, DDS;  Location: [facility];  Service: Oral Surgery;  Laterality: N/A;  . MALONEY DILATION  12/29/2010   RMR;  . NM MYOCAR PERF WALL MOTION  2006   "relavtiely normal" persantine, mild anterior thinning (breast attenuation artifact), no region of scar/ischemia  . OVARIAN CYST REMOVAL    . RECTOCELE REPAIR N/A 06/29/2015   Procedure: POSTERIOR REPAIR (RECTOCELE);  Surgeon: Jonnie Kind, MD;  Location: [facility];  Service: Gynecology;  Laterality: N/A;  . REDUCTION MAMMAPLASTY Bilateral   . SPINE SURGERY  09/29/2010   Dr. Rolena Infante  . surgical excision of 3 tumors from right thigh and right buttock  and left upper thigh  2010  . TOOTH EXTRACTION Bilateral 12/14/2014   Procedure: REMOVAL OF BILATERAL MANDIBULAR EXOSTOSES;  Surgeon: Diona Browner, DDS;  Location: [facility];  Service: Oral Surgery;  Laterality: Bilateral;  . TRANSTHORACIC ECHOCARDIOGRAM  2010   EF 60-65%, mild conc LVH, grade 1 diastolic dysfunction; mildly calcified MV annulus with mildly thickened  leaflets, mildly calcified MR annulus   History   Social History  . Marital Status: Divorced    Spouse Name: N/A  Number of Children: 1  . Years of Education: 12   Occupational History  . disabled     Social History Main Topics  . Smoking status: Current Every Day Smoker -- 0.25 packs/day for 7 years    Types: Cigarettes  . Smokeless tobacco: Never Used     Comment: "started back but off now for 3 months" (08/18/13)  . Alcohol Use: No     Comment:    . Drug Use: No   Current Outpatient Medications on File Prior to Visit  Medication Sig Dispense Refill  . ACCU-CHEK AVIVA PLUS test strip CHECK BLOOD SUGAR FOUR TIMES DAILY 400 strip 11  . albuterol (VENTOLIN HFA) 108 (90 Base) MCG/ACT inhaler Inhale 2 puffs into the lungs every 6 (six) hours as needed for wheezing or shortness of breath. 18 g 0  . amLODipine (NORVASC) 10 MG tablet Take 1 tablet (10 mg total) by mouth daily. 90 tablet 3  . ascorbic acid (VITAMIN C) 500 MG tablet Take 500 mg by mouth daily.    [name] aspirin EC 81 MG tablet Take 1 tablet (81 mg total) by mouth daily with breakfast. 120 tablet 2  . cetirizine (ZYRTEC) 10 MG tablet Take 1 tablet (10 mg total) by mouth daily. 7 tablet 0  . Cholecalciferol (VITAMIN D3) 5000 units CAPS Take 1 capsule by mouth daily.    . clobetasol cream (TEMOVATE) [id number] % Apply 1 application topically 2 (two) times daily.     . cromolyn (NASALCROM) 5.2 MG/ACT nasal spray Place 1 spray into both nostrils 4 (four) times daily. 13 mL 1  . diclofenac sodium (VOLTAREN) 1 % GEL APPLY 2 GRAMS  TOPICALLY 4 (FOUR) TIMES DAILY. 200 g 1  . insulin aspart (NOVOLOG FLEXPEN) 100 UNIT/ML FlexPen INJECT 10 TO 14 UNITS INTO THE SKIN 3 (THREE) TIMES DAILY WITH MEALS. 45 mL 3  . Insulin Glargine (TOUJEO SOLOSTAR) 300 UNIT/ML SOPN Inject 20 Units into the skin at bedtime. 9 pen 3  . lamoTRIgine (LAMICTAL) 100 MG tablet TAKE 1 TABLET TWICE DAILY 180 tablet 2  . levothyroxine (SYNTHROID) 50 MCG tablet Take 1 tablet  (50 mcg total) by mouth daily before breakfast. 30 tablet 5  . linaclotide (LINZESS) 145 MCG CAPS capsule Take 1 capsule (145 mcg total) by mouth daily before breakfast. 30 capsule 6  . LORazepam (ATIVAN) 0.5 MG tablet Take 1 tablet (0.5 mg total) by mouth 3 (three) times daily. 270 tablet 2  . losartan (COZAAR) 50 MG tablet TAKE 1 TABLET EVERY DAY 90 tablet 1  . lubiprostone (AMITIZA) 24 MCG capsule Take 1 capsule (24 mcg total) by mouth 2 (two) times daily with a meal. 60 capsule 6  . metaxalone (SKELAXIN) 800 MG tablet metaxalone 800 mg tablet  take 1 tablet by mouth 3 times daily as needed.    . metoprolol tartrate (LOPRESSOR) 50 MG tablet TAKE 1 TABLET TWICE DAILY (NEED MD APPOINTMENT) 180 tablet 3  . montelukast (SINGULAIR) 10 MG tablet TAKE 1 TABLET EVERY DAY 90 tablet 1  . Multiple Vitamins-Minerals (HM MULTIVITAMIN ADULT GUMMY PO) Take 1 tablet by mouth daily.    [name] nystatin (MYCOSTATIN/NYSTOP) powder APPLY TO AFFECTED AREA 4 TIMES DAILY. 30 g 2  . Omega-3 Fatty Acids (FISH OIL PO) Take 1 capsule by mouth daily.    . ondansetron (ZOFRAN) 4 MG tablet Take 1 tablet (4 mg total) by mouth every 8 (eight) hours as needed for nausea or vomiting. 20 tablet 0  . oxyCODONE-acetaminophen (PERCOCET) 10-325  MG tablet Take 1 tablet by mouth every 8 (eight) hours as needed.     . pregabalin (LYRICA) 75 MG capsule Take 1 capsule (75 mg total) by mouth daily.    . RABEprazole (ACIPHEX) 20 MG tablet TAKE 1 TABLET TWICE DAILY 180 tablet 3  . RESTASIS 0.05 % ophthalmic emulsion Place 1 drop into both eyes 2 (two) times daily.     . risperiDONE (RISPERDAL) 0.5 MG tablet TAKE 1 TABLET AT BEDTIME. 90 tablet 2  . rosuvastatin (CRESTOR) 5 MG tablet Take 1 tablet (5 mg total) by mouth at bedtime. 90 tablet 2  . sertraline (ZOLOFT) 100 MG tablet TAKE 1 TABLET EVERY DAY 90 tablet 2  . Tiotropium Bromide-Olodaterol (STIOLTO RESPIMAT) 2.5-2.5 MCG/ACT AERS Inhale 2 puffs into the lungs daily. 12 g 3  . traZODone  (DESYREL) 100 MG tablet Take 1 tablet (100 mg total) by mouth at bedtime. 90 tablet 2   Current Facility-Administered Medications on File Prior to Visit  Medication Dose Route Frequency Provider Last Rate Last Dose  . 0.9 %  sodium chloride infusion   Intravenous Continuous Lockamy, Randi L, NP-C   Stopped at 12/31/18 1542      Allergies  Allergen Reactions  . Cephalexin Hives  . Iron Nausea And Vomiting  . Milk-Related Compounds Other (See Comments)    Doesn't agree with stomach.   . Penicillins Hives       . Phenazopyridine Hcl Hives         Family History  Problem Relation Age of Onset  . Heart attack Mother        HTN  . Pneumonia Father   . Kidney failure Father   . Diabetes Father   . Pancreatic cancer Sister   . Diabetes Brother   . Hypertension Brother   . Diabetes Brother   . Cancer Sister        breast   . Hypertension Son   . Sleep apnea Son   . Cancer Sister        pancreatic  . Stroke Maternal Grandmother   . Heart attack Maternal Grandfather   . Alcohol abuse Maternal Uncle   . Colon cancer Neg Hx   . Anesthesia problems Neg Hx   . Hypotension Neg Hx   . Malignant hyperthermia Neg Hx   . Pseudochol deficiency Neg Hx   . Breast cancer Neg Hx    PE: BP (!) 180/80   Pulse 82   Ht 4\' 11"  (1.499 m)   Wt 169 lb (76.7 kg)   SpO2 97%   BMI 34.13 kg/m  Body mass index is 33.97 kg/m.  Wt Readings from Last 3 Encounters:  02/17/19 169 lb (76.7 kg)  02/04/19 168 lb 3.2 oz (76.3 kg)  02/03/19 168 lb (76.2 kg)   Constitutional: overweight, in NAD Eyes: PERRLA, EOMI, no exophthalmos ENT: moist mucous membranes, no thyromegaly, no cervical lymphadenopathy Cardiovascular: RRR, No MRG Respiratory: CTA B Gastrointestinal: abdomen soft, NT, ND, BS+ Musculoskeletal: no deformities, strength intact in all 4 Skin: moist, warm, no rashes Neurological: no tremor with outstretched hands, DTR normal in all 4  ASSESSMENT: 1. DM2, insulin-dependent,  uncontrolled, without complications - gastroparesis - cerebrovasc. Ds -  H/o stroke - PN  She was interested in an insulin pump >> discussed about [employer] (given brochure).  2. Hypothyroidism  3. MNG - Thyroid U/S (10/11/2012):  Right thyroid lobe: 3.7 x 1.0 x 1.8 cm  Left thyroid lobe: 3.5 x 1.5 x 1.6 cm  Isthmus: 0.5 cm  Focal nodules: There is a 0.3 mm calcified nodule in the right midzone. There is a 0.6 x 0.7 x 0.5 cm hypoechoic nodule in the lateral aspect of the right midzone. There is a 0.7 x 0.6 x 0.5 cm hyperechoic nodule in the lateral aspect of the right lower pole. There is a 2.2 x 1.1 x 1.7 cm complex solid nodule in the inferior aspect of the isthmus extending adjacent to the left lower pole. There is a 1.4 x 2.1 x 1.4 cm complex solid nodule in the left lower pole.  Lymphadenopathy: None visualized.  IMPRESSION: Multi nodular goiter which has markedly progressed since the prior exam. The dominant nodule at the inferior aspect of the isthmus fits criteria for fine needle aspiration biopsy if not previously Assessed.  - FNA (11/05/2012) x2: benign  - Thyroid U/S (01/20/2014) - felt one nodule enlarging >> new U/S: stable appearance of the nodules, except a small new nodule in isthmus: 1.2 cm   Right thyroid lobe Measurements: 4.4 cm x 1.2 cm x 1.7 cm. Multiple right-sided nodules identified. Each right-sided nodule demonstrates increased echogenicity, with the superior measuring 7 mm -8 mm, most inferior measuring 9 mm - 10 mm. A small, 3 mm focus of calcium with posterior shadowing is evident. There is also a mid nodule measuring 7 mm, which is echogenic.  Left thyroid lobe Measurements: 5.1 cm x 2.1 cm x 2.3 cm. Dominant lesion at the inferior pole of left thyroid is again evident, which has been previously biopsied (10/29/2012). This nodule measures 1.8 cm x 1.7cm x 2.5 cm. (Previous 1.4 cm x 2.1 cm x 1.4 cm)  Isthmus Thickness: 4 mm-5 mm. Isthmic  nodule again noted, previously biopsied (10/29/2012). Currently this nodule measures 2.2 cm x 1.4 cm x 1.8 cm. (previous measurement 2.2 cm x 1.1 cm x 1.7 cm). There is a new nodule identified within the isthmus with heterogeneously hyperechoic characteristics. This nodule measures 12 mm x 5.4 mm x 7.2 mm.  Lymphadenopathy: None visualized.  - Thyroid U/S (11/09/2015): Details   Reading Physician Reading Date Result Priority  Sandi Mariscal, MD 11/09/2015   Narrative    COMPARISON: Thyroid ultrasound - 01/20/2014 ; 10/11/2012  FINDINGS: Parenchymal Echotexture: Moderately heterogenous Estimated total number of nodules > 1 cm: <5 Number of spongiform nodules > 2 cm not described below (TR1): 0 Number of mixed cystic nodules > 1.5 cm not described below ([place]): 0 _____________________________________________________  Isthmus: 0.7 cm  Nodule # 1: Prior biopsy: No Location: Isthmus; left Size: 1.5 x 1.3 x 1.5 cm, previously 2.2 x 1.4 x 1.8 cm Composition: solid/almost completely solid (2) Echogenicity: hyperechoic (1) ACR TI-RADS total points: 3. Change in features: Yes. Increased internal cystic degeneration and smaller in size. Change in ACR TI-RADS risk category: No  *Given size (1.5 - 2.4 cm) and appearance, a follow-up ultrasound in 1 year is recommended based on TI-RADS criteria as clinically indicated.  _________________________________________________________  Right lobe: 4.6 x 1.1 x 1.9 cm, previously 4.4 x 1.2 x 1.7 cm Stable scattered subcentimeter echogenic nodules _________________________________________________________  Left lobe: 4.8 x 2.1 x 1.9 cm, previously 5.1 x 2.1 x 2.3 cm Nodule # 1: Prior biopsy: No Location: Left; Inferior Size: 2.4 x 1.7 x 1.9 cm, previously 2.5 x 1.7 x 1.7 Composition: solid/almost completely solid (2) Echogenicity: hyperechoic (1) ACR TI-RADS total points: 3. ACR TI-RADS risk category: TR3 (3 points). Change in features:  No Change in ACR TI-RADS risk category: No *Given  size (1.5 - 2.4 cm) and appearance, a follow-up ultrasound in 1 year is recommended based on TI-RADS criteria as clinically indicated.  IMPRESSION: TR 3 isthmic and left thyroid nodules unchanged. *Given size (1.5 - 2.4 cm) and appearance of both thyroid nodules, a follow-up ultrasound in 1 year is recommended based on TI-RADS criteria as clinically indicated.  The above is in keeping with the ACR TI-RADS recommendations Natasha Mead Coll Radiol 2017;14:[id number].        10/12/2016: Thyroid U/S: Parenchymal Echotexture: Moderately heterogenous Isthmus: 1.1 cm Right lobe: 3.8 x 1.0 x 1.7 cm Left lobe: 4.5 x 1.7 x 2.1 cm ______________________________________________________  Estimated total number of nodules >/= 1 cm: 3 _____________________________________________  Diffusely heterogeneous multinodular thyroid gland. As seen previously, there are multiple small echogenic nodules scattered throughout the right gland which do not meet criteria for biopsy or follow-up. Head and lobular pyramidal lobe extends superiorly from the thyroid isthmus. No interval change.  The previously biopsied nodule in the inferior aspect of the isthmus measures slightly smaller today at 1.5 x 1.0 x 1.4 cm compared to 1.7 x 1.3 x 1.5 cm previously.  The previously biopsied nodule in the left inferior gland is essentially unchanged at 2.6 x 1.3 x 1.5 cm compared to 2.4 x 1.8 x 1.9 cm. No new nodule or abnormality identified.  IMPRESSION: 1. Stable multinodular goiter with a prominent lobular parental lobe extending superiorly from the thyroid isthmus. 2. The previously biopsied nodule in the inferior isthmus measures slightly smaller on today's examination. 3. The previously biopsied nodule in the left inferior gland is stable. 4. Additional scattered small and benign-appearing nodules do not meet criteria for biopsy or continued  follow-up. The above is in keeping with the ACR TI-RADS recommendations - J Am Coll Radiol 2017;14:[id number].   PLAN:  1. Patient with longstanding, uncontrolled, type 2 diabetes, on basal-bolus insulin regimen, with slightly better sugars at last visit especially in the first half of the day after we increased the Toujeo dose at last visit.  Sugars later in the day were still high but they were quite variable and we had to be careful with adjusting her insulin doses to prevent low blood sugars.  I advised her to use the higher NovoLog doses in the recommended interval. -most recent HbA1c was higher than before, but still lower than goal, at 5.9%; however, her sugars at home are higher and the HbA1c does not align with him.  Therefore, today we will check a fructosamine level. -At today's visit, she tells me that she is taking the NovoLog after she checks her sugars after meals.  We discussed at every visit that the NovoLog has to be taken before a meal, ideally 10 to 15 minutes in advance.  I strongly advised her to start doing so.  We will not change her regimen for now since I suspect that her blood sugars in the 200s after meals are related to taking the insulin too late.  - I advised her to:  Patient Instructions  Please continue - Toujeo 25 units at bedtime - Novolog: 10-14 units before meals  Please continue levothyroxine 50 mcg Daily.  Please stop at the lab.  Please return in 3-4 months.  - advised to check sugars at different times of the day - 3x a day, rotating check times - advised for yearly eye exams >> she is UTD - return to clinic in 3-4 months   2. Hypothyroidism - she was previously on levothyroxine 50  mcg 6 out of 7 days and 25 mcg 1 out of 7 days, but this was apparently changed to 50 mcg daily after her hospitalization. - latest thyroid labs reviewed with pt >> normal: Lab Results  Component Value Date   TSH 0.82 09/30/2018  - pt feels good on this dose. - we  discussed about taking the thyroid hormone every day, with water, >30 minutes before breakfast, separated by >4 hours from acid reflux medications, calcium, iron, multivitamins. Pt. is taking it correctly. - will check thyroid tests today: TSH and fT4 - If labs are abnormal, she will need to return for repeat TFTs in 1.5 months  3. MNG -She has a history of 2 normal thyroid nodule biopsies in 2014 -We reviewed the ultrasound report from 2014 2019 and the nodules appear stable -We do not need to repeat a thyroid ultrasound unless she start developing neck compression symptoms.  She does have mild dysphagia but does not have esophageal compression per the barium swallow study from 08/2014.  At that time she was found to have esophageal dysmotility and had multiple esophageal dilations. -We will continue to follow this expectantly for now  Office Visit on 02/17/2019  Component Date Value Ref Range Status  . Fructosamine 02/17/2019 290* 205 - 285 umol/L Final  . TSH 02/17/2019 0.94  0.35 - 4.50 uIU/mL Final  . Free T4 02/17/2019 0.78  0.60 - 1.60 ng/dL Final   Comment: Specimens from patients who are undergoing biotin therapy and /or ingesting biotin supplements may contain high levels of biotin.  The higher biotin concentration in these specimens interferes with this Free T4 assay.  Specimens that contain high levels  of biotin may cause false high results for this Free T4 assay.  Please interpret results in light of the total clinical presentation of the patient.      HbA1c calculated from fructosamine is excellent, at 6.5%.  Thyroid test are also normal.  Philemon Kingdom, MD PhD [facility] Endocrinology

## 2019-02-18 ENCOUNTER — Other Ambulatory Visit: Payer: Self-pay

## 2019-02-18 LAB — T4, FREE: Free T4: 0.78 ng/dL (ref 0.60–1.60)

## 2019-02-18 LAB — TSH: TSH: 0.94 u[IU]/mL (ref 0.35–4.50)

## 2019-02-18 NOTE — Patient Outreach (Signed)
[facility] Care Management  [place]  02/18/2019   Cacia [name] [place] [birth year] [id number]  Subjective: Telephone call to patient for follow up. Patient reports she is doing ok.  She reports that she saw her doctor on yesterday and no changes to regimen at this time.  Patient reports that her breathing is ok.  Discussed COPD and when to notify physician for worsening.  She verbalized understanding and denies any needs.    Objective:   Encounter Medications:  Outpatient Encounter Medications as of 02/18/2019  Medication Sig  . ACCU-CHEK AVIVA PLUS test strip CHECK BLOOD SUGAR FOUR TIMES DAILY  . albuterol (VENTOLIN HFA) 108 (90 Base) MCG/ACT inhaler Inhale 2 puffs into the lungs every 6 (six) hours as needed for wheezing or shortness of breath.  [name] amLODipine (NORVASC) 10 MG tablet Take 1 tablet (10 mg total) by mouth daily.  [name] ascorbic acid (VITAMIN C) 500 MG tablet Take 500 mg by mouth daily.  [name] aspirin EC 81 MG tablet Take 1 tablet (81 mg total) by mouth daily with breakfast.  . cetirizine (ZYRTEC) 10 MG tablet Take 1 tablet (10 mg total) by mouth daily.  . Cholecalciferol (VITAMIN D3) 5000 units CAPS Take 1 capsule by mouth daily.  . clobetasol cream (TEMOVATE) [id number] % Apply 1 application topically 2 (two) times daily.   . cromolyn (NASALCROM) 5.2 MG/ACT nasal spray Place 1 spray into both nostrils 4 (four) times daily.  . diclofenac sodium (VOLTAREN) 1 % GEL APPLY 2 GRAMS  TOPICALLY 4 (FOUR) TIMES DAILY.  [name] insulin aspart (NOVOLOG FLEXPEN) 100 UNIT/ML FlexPen INJECT 10 TO 14 UNITS INTO THE SKIN 3 (THREE) TIMES DAILY WITH MEALS.  [name] Insulin Glargine, 2 Unit Dial, (TOUJEO MAX SOLOSTAR) 300 UNIT/ML SOPN Inject 25 Units into the skin at bedtime.  . lamoTRIgine (LAMICTAL) 100 MG tablet TAKE 1 TABLET TWICE DAILY  . Lancets (ACCU-CHEK SOFT TOUCH) lancets Use as instructed to check blood sugar 3 times a day.  . levothyroxine (SYNTHROID) 50 MCG tablet Take 1 tablet (50 mcg  total) by mouth daily before breakfast.  . linaclotide (LINZESS) 145 MCG CAPS capsule Take 1 capsule (145 mcg total) by mouth daily before breakfast.  . LORazepam (ATIVAN) 0.5 MG tablet Take 1 tablet (0.5 mg total) by mouth 3 (three) times daily.  [name] losartan (COZAAR) 50 MG tablet TAKE 1 TABLET EVERY DAY  . lubiprostone (AMITIZA) 24 MCG capsule Take 1 capsule (24 mcg total) by mouth 2 (two) times daily with a meal.  . metaxalone (SKELAXIN) 800 MG tablet metaxalone 800 mg tablet  take 1 tablet by mouth 3 times daily as needed.  . metoprolol tartrate (LOPRESSOR) 50 MG tablet TAKE 1 TABLET TWICE DAILY (NEED MD APPOINTMENT)  . montelukast (SINGULAIR) 10 MG tablet TAKE 1 TABLET EVERY DAY  . Multiple Vitamins-Minerals (HM MULTIVITAMIN ADULT GUMMY PO) Take 1 tablet by mouth daily.  [name] nystatin (MYCOSTATIN/NYSTOP) powder APPLY TO AFFECTED AREA 4 TIMES DAILY.  [name] Omega-3 Fatty Acids (FISH OIL PO) Take 1 capsule by mouth daily.  . ondansetron (ZOFRAN) 4 MG tablet Take 1 tablet (4 mg total) by mouth every 8 (eight) hours as needed for nausea or vomiting.  [name] oxyCODONE-acetaminophen (PERCOCET) 10-325 MG tablet Take 1 tablet by mouth every 8 (eight) hours as needed.   . pregabalin (LYRICA) 75 MG capsule Take 1 capsule (75 mg total) by mouth daily.  . RABEprazole (ACIPHEX) 20 MG tablet TAKE 1 TABLET TWICE DAILY  . RESTASIS 0.05 %  ophthalmic emulsion Place 1 drop into both eyes 2 (two) times daily.   . risperiDONE (RISPERDAL) 0.5 MG tablet TAKE 1 TABLET AT BEDTIME.  . rosuvastatin (CRESTOR) 5 MG tablet Take 1 tablet (5 mg total) by mouth at bedtime.  . sertraline (ZOLOFT) 100 MG tablet TAKE 1 TABLET EVERY DAY  . Tiotropium Bromide-Olodaterol (STIOLTO RESPIMAT) 2.5-2.5 MCG/ACT AERS Inhale 2 puffs into the lungs daily.  . traZODone (DESYREL) 100 MG tablet Take 1 tablet (100 mg total) by mouth at bedtime.   Facility-Administered Encounter Medications as of 02/18/2019  Medication  . 0.9 %  sodium chloride infusion     Functional Status:  In your present state of health, do you have any difficulty performing the following activities: 01/15/2019 12/24/2018  Hearing? N N  Vision? N N  Difficulty concentrating or making decisions? N N  Walking or climbing stairs? Y Y  Comment some weakness weakness and fatigue  Dressing or bathing? Y N  Comment Needs assistance from aide -  Doing errands, shopping? Y N  Comment aide takes shopping -  Conservation officer, nature and eating ? Y N  Comment aide assists -  Using the Toilet? N N  In the past six months, have you accidently leaked urine? N N  Do you have problems with loss of bowel control? N N  Managing your Medications? N N  Managing your Finances? N N  Housekeeping or managing your Housekeeping? N N  Some recent data might be hidden    Fall/Depression Screening: Fall Risk  02/03/2019 01/15/2019 12/24/2018  Falls in the past year? 1 0 1  Comment - - -  Number falls in past yr: 1 - 1  Comment - - -  Injury with Fall? 1 - 0  Comment - - -  Risk Factor Category  - - -  Risk for fall due to : - - History of fall(s);Impaired balance/gait;Impaired mobility;Orthopedic patient  Follow up - - -   PHQ 2/9 Scores 01/15/2019 12/24/2018 12/13/2018 12/11/2018 10/10/2018 09/30/2018 09/04/2018  PHQ - 2 Score 2 2 0 3 2 3  0  PHQ- 9 Score 9 13 - 14 6 8  -  Some encounter information is confidential and restricted. Go to Review Flowsheets activity to see all data.    Assessment: Patient continues to manage chronic illnesses.  No reported hospitalizations.    Plan:  [facility] CM Care Plan Problem One     Most Recent Value  Care Plan Problem One  Knowledge deficit related to recent hospitalization  Role Documenting the Problem One  Care Management Telephonic [place] for Problem One  Active  [facility] CM Short Term Goal #2   Patient will not have any exacerbations of COPD within the next 30 days.    [facility] CM Short Term Goal #2 Start Date  02/18/19  Interventions for Short  Term Goal #2  RN CM reviewed with patient ways of monitoring COPD, signs & symptoms, and when to notify physician.     RN CM will contact again in the month of January and patient agreeable.    Jone Baseman, RN, MSN [place] Management Care Management Coordinator Direct Line [phone] Cell [phone] Toll Free: [phone]  Fax: [phone]

## 2019-02-21 ENCOUNTER — Ambulatory Visit: Payer: Self-pay

## 2019-02-22 LAB — FRUCTOSAMINE: Fructosamine: 290 umol/L — ABNORMAL HIGH (ref 205–285)

## 2019-02-24 ENCOUNTER — Telehealth: Payer: Self-pay | Admitting: Internal Medicine

## 2019-02-24 ENCOUNTER — Telehealth: Payer: Self-pay | Admitting: *Deleted

## 2019-02-24 NOTE — Telephone Encounter (Signed)
Patient called to request results from most recent labs.

## 2019-02-24 NOTE — Telephone Encounter (Signed)
I will get to them, but I have so many in my inbox right now (not sure what happened...) that I am not sure when our can finish them.

## 2019-02-24 NOTE — Telephone Encounter (Signed)
Pt is calling she is confused about her lab results and needs someone to walk her through it

## 2019-02-24 NOTE — Telephone Encounter (Signed)
Called and went through her lab results with her

## 2019-02-26 ENCOUNTER — Other Ambulatory Visit: Payer: Self-pay

## 2019-02-26 ENCOUNTER — Ambulatory Visit (INDEPENDENT_AMBULATORY_CARE_PROVIDER_SITE_OTHER): Payer: Medicare HMO | Admitting: Psychiatry

## 2019-02-26 DIAGNOSIS — F331 Major depressive disorder, recurrent, moderate: Secondary | ICD-10-CM

## 2019-02-26 NOTE — Progress Notes (Signed)
Virtual Visit via Telephone Note  I connected with [name] on 02/26/19 at 10:20 AM EST  by telephone and verified that I am speaking with the correct person using two identifiers.   I discussed the limitations, risks, security and privacy concerns of performing an evaluation and management service by telephone and the availability of in person appointments. I also discussed with the patient that there may be a patient responsible charge related to this service. The patient expressed understanding and agreed to proceed.   I provided 35 minutes of non-face-to-face time during this encounter.   [name], LCSW             THERAPIST PROGRESS NOTE  Session Time: Wednesday 02/26/2019 10:20 AM - 10:55 AM   Participation Level: Active  Behavioral Response: lethargic, depressed, anxious  Type of Therapy: Individual Therapy  Treatment Goals addressed: Learn and implement behavioral strategies to overcome depression.                                                       Verbalize an understanding and resolution of current interpersonal problems.   Interventions: CBT and Supportive  Summary: [name] is a 68 y.o. female who presents with  long standing history of recurrent periods  of depression beginning when she was 29 and her favorite uncle died. Patient reports multiple psychiatric hospitlaizations due to depression and suicidal ideations with the last one occuring in 1997. Patient has participated in outpatient psychotherapy and medication management intermittently since age 64. She currently is seeing psychiatrist Dr. Harrington Challenger . Prior to this, she was seen at [facility]. Patient also has had ECT at [facility]. Symptoms have worsened in recent months due to family stress and have  included depressed mood, anxiety, excessive worry, and tearfulness.         Patient's last contact was by virtual visit via telephone  2 weeks ago. She states depression has been up and down. She also  reports increased crying spells and expresses frustration and sadness she continues to experience significant arm and shoulder pain.  She wants to proceed with surgery but reports it has been delayed due to Covid 19 pandemic.  She also expresses frustration family members and friends keep giving her advice about how to manage the pain.  She also expresses sadness her pastor is retiring and question self as to whether or not she is the blame for this due to remarks she made to him earlier this year.  She is pleased her grandson and granddaughter both visited her recently.  She maintains abstinence from cigarettes.  She reports using a gratitude list sporadically.  Suicidal/Homicidal: Nowithout intent/plan  Therapist Response: Reviewed symptoms, praised and reinforced patient's continued efforts to maintain abstinence from cigarettes, praised and reinforced patient's efforts to use gratitude list, discussed results, assisted up patient develop plan to use gratitude list daily, assisted patient identify ways to set limits regarding her health with family members/friends, assisted patient identify/challenge/and replace unhelpful thoughts regarding past or retiring with more helpful thoughts, validated sadness about past or retiring, discussed coping statements to use in coping with frustration about pain   Plan: Return again in 2 weeks.  Diagnosis: Axis I: MDD, Recurrent, moderate    Axis II: Deferred    Jesua Tamblyn E Benjimin Hadden, LCSW

## 2019-02-28 ENCOUNTER — Other Ambulatory Visit: Payer: Self-pay | Admitting: Family Medicine

## 2019-02-28 DIAGNOSIS — Z79899 Other long term (current) drug therapy: Secondary | ICD-10-CM | POA: Diagnosis not present

## 2019-02-28 DIAGNOSIS — E785 Hyperlipidemia, unspecified: Secondary | ICD-10-CM | POA: Diagnosis not present

## 2019-02-28 DIAGNOSIS — G4733 Obstructive sleep apnea (adult) (pediatric): Secondary | ICD-10-CM | POA: Diagnosis not present

## 2019-03-03 ENCOUNTER — Other Ambulatory Visit: Payer: Self-pay | Admitting: Family Medicine

## 2019-03-03 DIAGNOSIS — R140 Abdominal distension (gaseous): Secondary | ICD-10-CM | POA: Diagnosis not present

## 2019-03-03 DIAGNOSIS — I69354 Hemiplegia and hemiparesis following cerebral infarction affecting left non-dominant side: Secondary | ICD-10-CM | POA: Diagnosis not present

## 2019-03-03 DIAGNOSIS — M7542 Impingement syndrome of left shoulder: Secondary | ICD-10-CM | POA: Diagnosis not present

## 2019-03-03 DIAGNOSIS — J449 Chronic obstructive pulmonary disease, unspecified: Secondary | ICD-10-CM | POA: Diagnosis not present

## 2019-03-03 DIAGNOSIS — K56699 Other intestinal obstruction unspecified as to partial versus complete obstruction: Secondary | ICD-10-CM | POA: Diagnosis not present

## 2019-03-03 DIAGNOSIS — M5030 Other cervical disc degeneration, unspecified cervical region: Secondary | ICD-10-CM | POA: Diagnosis not present

## 2019-03-03 DIAGNOSIS — D320 Benign neoplasm of cerebral meninges: Secondary | ICD-10-CM | POA: Diagnosis not present

## 2019-03-03 DIAGNOSIS — M159 Polyosteoarthritis, unspecified: Secondary | ICD-10-CM | POA: Diagnosis not present

## 2019-03-03 DIAGNOSIS — E1142 Type 2 diabetes mellitus with diabetic polyneuropathy: Secondary | ICD-10-CM | POA: Diagnosis not present

## 2019-03-03 MED ORDER — EZETIMIBE 10 MG PO TABS: 10.0000 mg | ORAL_TABLET | Freq: Every day | ORAL | 1 refills | Status: DC

## 2019-03-03 NOTE — Progress Notes (Signed)
Needs to remain on same dose of vitamin D, and will add zetia 10 mg for high TG and she needs to remain on same dose of crestor, please let her know

## 2019-03-05 DIAGNOSIS — G4733 Obstructive sleep apnea (adult) (pediatric): Secondary | ICD-10-CM | POA: Diagnosis not present

## 2019-03-11 ENCOUNTER — Telehealth: Payer: Self-pay | Admitting: Internal Medicine

## 2019-03-11 MED ORDER — ACCU-CHEK SOFT TOUCH LANCETS MISC: 12 refills | Status: DC

## 2019-03-11 NOTE — Telephone Encounter (Signed)
MEDICATION: Lancets  PHARMACY:   [facility]  IS THIS A 90 DAY SUPPLY : yes  IS PATIENT OUT OF MEDICATION:   IF NOT; HOW MUCH IS LEFT:   LAST APPOINTMENT DATE: @12 /14/2020  NEXT APPOINTMENT DATE:@4 /20/2021  DO WE HAVE YOUR PERMISSION TO LEAVE A DETAILED MESSAGE:  OTHER COMMENTS:    **Let patient know to contact pharmacy at the end of the day to make sure medication is ready. **  ** Please notify patient to allow 48-72 hours to process**  **Encourage patient to contact the pharmacy for refills or they can request refills through [facility]**

## 2019-03-11 NOTE — Telephone Encounter (Signed)
RX sent

## 2019-03-12 ENCOUNTER — Ambulatory Visit (INDEPENDENT_AMBULATORY_CARE_PROVIDER_SITE_OTHER): Payer: Medicare HMO | Admitting: Psychiatry

## 2019-03-12 ENCOUNTER — Encounter (HOSPITAL_COMMUNITY): Payer: Self-pay | Admitting: Psychiatry

## 2019-03-12 ENCOUNTER — Other Ambulatory Visit: Payer: Self-pay

## 2019-03-12 DIAGNOSIS — F331 Major depressive disorder, recurrent, moderate: Secondary | ICD-10-CM | POA: Diagnosis not present

## 2019-03-12 DIAGNOSIS — F419 Anxiety disorder, unspecified: Secondary | ICD-10-CM | POA: Diagnosis not present

## 2019-03-12 MED ORDER — SERTRALINE HCL 100 MG PO TABS: 150.0000 mg | ORAL_TABLET | Freq: Every day | ORAL | 2 refills | Status: DC

## 2019-03-12 MED ORDER — TRAZODONE HCL 100 MG PO TABS: 100.0000 mg | ORAL_TABLET | Freq: Every day | ORAL | 2 refills | Status: DC

## 2019-03-12 MED ORDER — LORAZEPAM 0.5 MG PO TABS: 0.5000 mg | ORAL_TABLET | Freq: Three times a day (TID) | ORAL | 2 refills | Status: DC

## 2019-03-12 MED ORDER — RISPERIDONE 0.5 MG PO TABS: 0.5000 mg | ORAL_TABLET | Freq: Every day | ORAL | 2 refills | Status: DC
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
## 2019-03-13 ENCOUNTER — Telehealth (HOSPITAL_COMMUNITY): Payer: Self-pay

## 2019-03-13 NOTE — Telephone Encounter (Signed)
Medication management - Telephone message for patient that Dr. Harrington Challenger did send in an increase in her Zoloft to 100 mg, 1.5 tablets per day to her [place] so verified patient could take 1.5 tablets daily.  Patient to call back if further questions.

## 2019-03-14 DIAGNOSIS — J449 Chronic obstructive pulmonary disease, unspecified: Secondary | ICD-10-CM | POA: Diagnosis not present

## 2019-03-14 DIAGNOSIS — I10 Essential (primary) hypertension: Secondary | ICD-10-CM | POA: Diagnosis not present

## 2019-03-15 ENCOUNTER — Other Ambulatory Visit: Payer: Self-pay | Admitting: Family Medicine

## 2019-03-15 DIAGNOSIS — R32 Unspecified urinary incontinence: Secondary | ICD-10-CM | POA: Diagnosis not present

## 2019-03-15 MED ORDER — ALENDRONATE SODIUM 70 MG PO TABS: 70.0000 mg | ORAL_TABLET | ORAL | 3 refills | Status: DC

## 2019-03-15 NOTE — Progress Notes (Signed)
Fosamax

## 2019-03-18 DIAGNOSIS — M5030 Other cervical disc degeneration, unspecified cervical region: Secondary | ICD-10-CM | POA: Diagnosis not present

## 2019-03-18 DIAGNOSIS — I69354 Hemiplegia and hemiparesis following cerebral infarction affecting left non-dominant side: Secondary | ICD-10-CM | POA: Diagnosis not present

## 2019-03-18 DIAGNOSIS — E1142 Type 2 diabetes mellitus with diabetic polyneuropathy: Secondary | ICD-10-CM | POA: Diagnosis not present

## 2019-03-18 DIAGNOSIS — Z9181 History of falling: Secondary | ICD-10-CM | POA: Diagnosis not present

## 2019-03-18 DIAGNOSIS — M179 Osteoarthritis of knee, unspecified: Secondary | ICD-10-CM | POA: Diagnosis not present

## 2019-03-18 DIAGNOSIS — M159 Polyosteoarthritis, unspecified: Secondary | ICD-10-CM | POA: Diagnosis not present

## 2019-03-18 DIAGNOSIS — R140 Abdominal distension (gaseous): Secondary | ICD-10-CM | POA: Diagnosis not present

## 2019-03-18 DIAGNOSIS — J449 Chronic obstructive pulmonary disease, unspecified: Secondary | ICD-10-CM | POA: Diagnosis not present

## 2019-03-18 DIAGNOSIS — I10 Essential (primary) hypertension: Secondary | ICD-10-CM | POA: Diagnosis not present

## 2019-03-18 DIAGNOSIS — M7542 Impingement syndrome of left shoulder: Secondary | ICD-10-CM | POA: Diagnosis not present

## 2019-03-18 DIAGNOSIS — K56699 Other intestinal obstruction unspecified as to partial versus complete obstruction: Secondary | ICD-10-CM | POA: Diagnosis not present

## 2019-03-18 DIAGNOSIS — D320 Benign neoplasm of cerebral meninges: Secondary | ICD-10-CM | POA: Diagnosis not present

## 2019-03-20 ENCOUNTER — Other Ambulatory Visit: Payer: Self-pay

## 2019-03-20 DIAGNOSIS — M19112 Post-traumatic osteoarthritis, left shoulder: Secondary | ICD-10-CM | POA: Diagnosis not present

## 2019-03-20 DIAGNOSIS — M25512 Pain in left shoulder: Secondary | ICD-10-CM | POA: Diagnosis not present

## 2019-03-20 NOTE — Patient Outreach (Signed)
[facility]) Care Management  [place]  03/20/2019   [name] 29-Sep-1950 [id number]  Subjective: Telephone to patient for follow up.  Patient reports she is having a hard time with her left arm hurting and that she saw the doctor today.  She states he drew fluid off of it and will recheck it.  Patient reports that her breathing is fair but she gets winded with activity.  She denies any increases in shortness of breath.  Discussed COPD symptoms and when to notify physician.  She verbalized understanding and denies any needs at this time.   Objective:   Encounter Medications:  Outpatient Encounter Medications as of 03/20/2019  Medication Sig  . ACCU-CHEK AVIVA PLUS test strip CHECK BLOOD SUGAR FOUR TIMES DAILY  . albuterol (VENTOLIN HFA) 108 (90 Base) MCG/ACT inhaler Inhale 2 puffs into the lungs every 6 (six) hours as needed for wheezing or shortness of breath.  [name] alendronate (FOSAMAX) 70 MG tablet Take 1 tablet (70 mg total) by mouth every 7 (seven) days. Take with a full glass of water on an empty stomach.  [name] amLODipine (NORVASC) 10 MG tablet Take 1 tablet (10 mg total) by mouth daily.  [name] ascorbic acid (VITAMIN C) 500 MG tablet Take 500 mg by mouth daily.  [name] aspirin EC 81 MG tablet Take 1 tablet (81 mg total) by mouth daily with breakfast.  . cetirizine (ZYRTEC) 10 MG tablet Take 1 tablet (10 mg total) by mouth daily.  . Cholecalciferol (VITAMIN D3) 5000 units CAPS Take 1 capsule by mouth daily.  . clobetasol cream (TEMOVATE) [id number] % Apply 1 application topically 2 (two) times daily.   . cromolyn (NASALCROM) 5.2 MG/ACT nasal spray Place 1 spray into both nostrils 4 (four) times daily.  . diclofenac sodium (VOLTAREN) 1 % GEL APPLY 2 GRAMS  TOPICALLY 4 (FOUR) TIMES DAILY.  [name] ezetimibe (ZETIA) 10 MG tablet Take 1 tablet (10 mg total) by mouth daily.  . insulin aspart (NOVOLOG FLEXPEN) 100 UNIT/ML FlexPen INJECT 10 TO 14 UNITS INTO THE SKIN 3 (THREE) TIMES DAILY  WITH MEALS.  [name] Insulin Glargine, 2 Unit Dial, (TOUJEO MAX SOLOSTAR) 300 UNIT/ML SOPN Inject 25 Units into the skin at bedtime.  . lamoTRIgine (LAMICTAL) 100 MG tablet TAKE 1 TABLET TWICE DAILY  . Lancets (ACCU-CHEK SOFT TOUCH) lancets Use as instructed to check blood sugar 3 times a day.  . levothyroxine (SYNTHROID) 50 MCG tablet TAKE 1 TABLET DAILY, EXCEPT TAKE 1/2 TABLET ON SUNDAY  . linaclotide (LINZESS) 145 MCG CAPS capsule Take 1 capsule (145 mcg total) by mouth daily before breakfast.  . LORazepam (ATIVAN) 0.5 MG tablet Take 1 tablet (0.5 mg total) by mouth 3 (three) times daily.  [name] losartan (COZAAR) 50 MG tablet TAKE 1 TABLET EVERY DAY  . lubiprostone (AMITIZA) 24 MCG capsule Take 1 capsule (24 mcg total) by mouth 2 (two) times daily with a meal.  . metaxalone (SKELAXIN) 800 MG tablet metaxalone 800 mg tablet  take 1 tablet by mouth 3 times daily as needed.  . metoprolol tartrate (LOPRESSOR) 50 MG tablet TAKE 1 TABLET TWICE DAILY (NEED MD APPOINTMENT)  . montelukast (SINGULAIR) 10 MG tablet TAKE 1 TABLET EVERY DAY  . Multiple Vitamins-Minerals (HM MULTIVITAMIN ADULT GUMMY PO) Take 1 tablet by mouth daily.  [name] nystatin (MYCOSTATIN/NYSTOP) powder APPLY TO AFFECTED AREA 4 TIMES DAILY.  [name] Omega-3 Fatty Acids (FISH OIL PO) Take 1 capsule by mouth daily.  . ondansetron (ZOFRAN) 4 MG tablet  Take 1 tablet (4 mg total) by mouth every 8 (eight) hours as needed for nausea or vomiting.  [name] oxyCODONE-acetaminophen (PERCOCET) 10-325 MG tablet Take 1 tablet by mouth every 8 (eight) hours as needed.   . pregabalin (LYRICA) 75 MG capsule Take 1 capsule (75 mg total) by mouth daily.  . RABEprazole (ACIPHEX) 20 MG tablet TAKE 1 TABLET TWICE DAILY  . RESTASIS 0.05 % ophthalmic emulsion Place 1 drop into both eyes 2 (two) times daily.   . risperiDONE (RISPERDAL) 0.5 MG tablet Take 1 tablet (0.5 mg total) by mouth at bedtime.  . rosuvastatin (CRESTOR) 5 MG tablet Take 1 tablet (5 mg total) by mouth at bedtime.   . sertraline (ZOLOFT) 100 MG tablet Take 1.5 tablets (150 mg total) by mouth daily.  . Tiotropium Bromide-Olodaterol (STIOLTO RESPIMAT) 2.5-2.5 MCG/ACT AERS Inhale 2 puffs into the lungs daily.  . traZODone (DESYREL) 100 MG tablet Take 1 tablet (100 mg total) by mouth at bedtime.   Facility-Administered Encounter Medications as of 03/20/2019  Medication  . 0.9 %  sodium chloride infusion    Functional Status:  In your present state of health, do you have any difficulty performing the following activities: 01/15/2019 12/24/2018  Hearing? N N  Vision? N N  Difficulty concentrating or making decisions? N N  Walking or climbing stairs? Y Y  Comment some weakness weakness and fatigue  Dressing or bathing? Y N  Comment Needs assistance from aide -  Doing errands, shopping? Y N  Comment aide takes shopping -  Conservation officer, nature and eating ? Y N  Comment aide assists -  Using the Toilet? N N  In the past six months, have you accidently leaked urine? N N  Do you have problems with loss of bowel control? N N  Managing your Medications? N N  Managing your Finances? N N  Housekeeping or managing your Housekeeping? N N  Some recent data might be hidden    Fall/Depression Screening: Fall Risk  02/03/2019 01/15/2019 12/24/2018  Falls in the past year? 1 0 1  Comment - - -  Number falls in past yr: 1 - 1  Comment - - -  Injury with Fall? 1 - 0  Comment - - -  Risk Factor Category  - - -  Risk for fall due to : - - History of fall(s);Impaired balance/gait;Impaired mobility;Orthopedic patient  Follow up - - -   PHQ 2/9 Scores 01/15/2019 12/24/2018 12/13/2018 12/11/2018 10/10/2018 09/30/2018 09/04/2018  PHQ - 2 Score 2 2 0 3 2 3  0  PHQ- 9 Score 9 13 - 14 6 8  -  Some encounter information is confidential and restricted. Go to Review Flowsheets activity to see all data.    Assessment: Patient continues to manage chronic illnesses. No recent hospitalizations.  Plan:  [facility] CM Care Plan Problem One      Most Recent Value  Care Plan Problem One  Knowledge deficit related to recent hospitalization  Role Documenting the Problem One  Care Management Telephonic [place] for Problem One  Active  [facility] Long Term Goal   Patient will not have any exacerbations of COPD within 90 days.    [facility] Long Term Goal Start Date  03/20/19  Interventions for Problem One Long Term Goal  RN CM discussed with patient COPD status and signs of COPD and when to notify physician.       RN CM will contact in the month of March and patient agreeable.  Jone Baseman, RN, MSN [place] Management Care Management Coordinator Direct Line [phone] Cell [phone] Toll Free: [phone]  Fax: [phone]

## 2019-03-24 ENCOUNTER — Telehealth: Payer: Self-pay

## 2019-03-24 NOTE — Telephone Encounter (Signed)
DOES HAVE RESTASIS AND WAS ASKING FOR A NEW MED WHICH I CANNOT PRESCRIBE, WILL FORWARD TO  REFERRAL STAFF TO SEE IF THEY CAN  GET A SOONER APPT FOR HER  SHANNA PLEASE HELP AS ABLE, THANKS

## 2019-03-24 NOTE — Telephone Encounter (Signed)
I recommend she have eye Dr prescribe for dry eyes. Since no rash mentioned , itching likelyu linked to anxiety and she should f/u with Psych re this complaint, pls let her know also best for 2021!

## 2019-03-24 NOTE — Telephone Encounter (Signed)
Pt is having Dry eyes, and itching under skin

## 2019-03-24 NOTE — Telephone Encounter (Signed)
Patient states she can't get in with eye doctor until March.

## 2019-03-25 ENCOUNTER — Ambulatory Visit: Payer: Self-pay

## 2019-03-25 ENCOUNTER — Encounter (HOSPITAL_COMMUNITY): Payer: Self-pay

## 2019-03-25 ENCOUNTER — Inpatient Hospital Stay (HOSPITAL_COMMUNITY): Payer: Medicare HMO

## 2019-03-25 ENCOUNTER — Other Ambulatory Visit: Payer: Self-pay

## 2019-03-25 ENCOUNTER — Inpatient Hospital Stay (HOSPITAL_COMMUNITY): Payer: Medicare HMO | Attending: Hematology

## 2019-03-25 DIAGNOSIS — D509 Iron deficiency anemia, unspecified: Secondary | ICD-10-CM | POA: Insufficient documentation

## 2019-03-25 DIAGNOSIS — N189 Chronic kidney disease, unspecified: Secondary | ICD-10-CM | POA: Diagnosis not present

## 2019-03-25 DIAGNOSIS — K2100 Gastro-esophageal reflux disease with esophagitis, without bleeding: Secondary | ICD-10-CM | POA: Insufficient documentation

## 2019-03-25 DIAGNOSIS — Z87891 Personal history of nicotine dependence: Secondary | ICD-10-CM | POA: Diagnosis not present

## 2019-03-25 DIAGNOSIS — Z86010 Personal history of colonic polyps: Secondary | ICD-10-CM | POA: Insufficient documentation

## 2019-03-25 LAB — COMPREHENSIVE METABOLIC PANEL
ALT: 24 U/L (ref 0–44)
AST: 18 U/L (ref 15–41)
Albumin: 4.2 g/dL (ref 3.5–5.0)
Alkaline Phosphatase: 116 U/L (ref 38–126)
Anion gap: 7 (ref 5–15)
BUN: 31 mg/dL — ABNORMAL HIGH (ref 8–23)
CO2: 25 mmol/L (ref 22–32)
Calcium: 9.3 mg/dL (ref 8.9–10.3)
Chloride: 107 mmol/L (ref 98–111)
Creatinine, Ser: 1.03 mg/dL — ABNORMAL HIGH (ref 0.44–1.00)
GFR calc Af Amer: >60 mL/min (ref >60)
GFR calc non Af Amer: 56 mL/min — ABNORMAL LOW (ref >60)
Glucose, Bld: 163 mg/dL — ABNORMAL HIGH (ref 70–99)
Potassium: 4.1 mmol/L (ref 3.5–5.1)
Sodium: 139 mmol/L (ref 135–145)
Total Bilirubin: 0.6 mg/dL (ref 0.3–1.2)
Total Protein: 7.9 g/dL (ref 6.5–8.1)

## 2019-03-25 LAB — CBC WITH DIFFERENTIAL/PLATELET
Abs Immature Granulocytes: 0.07 10*3/uL (ref 0.00–0.07)
Basophils Absolute: 0 10*3/uL (ref 0.0–0.1)
Basophils Relative: 0 %
Eosinophils Absolute: 0.2 10*3/uL (ref 0.0–0.5)
Eosinophils Relative: 3 %
HCT: 41.3 % (ref 36.0–46.0)
Hemoglobin: 11.6 g/dL — ABNORMAL LOW (ref 12.0–15.0)
Immature Granulocytes: 1 %
Lymphocytes Relative: 23 %
Lymphs Abs: 2.2 10*3/uL (ref 0.7–4.0)
MCH: 22 pg — ABNORMAL LOW (ref 26.0–34.0)
MCHC: 28.1 g/dL — ABNORMAL LOW (ref 30.0–36.0)
MCV: 78.4 fL — ABNORMAL LOW (ref 80.0–100.0)
Monocytes Absolute: 0.8 10*3/uL (ref 0.1–1.0)
Monocytes Relative: 8 %
Neutro Abs: 6.1 10*3/uL (ref 1.7–7.7)
Neutrophils Relative %: 65 %
Platelets: 278 10*3/uL (ref 150–400)
RBC: 5.27 MIL/uL — ABNORMAL HIGH (ref 3.87–5.11)
RDW: 13.9 % (ref 11.5–15.5)
WBC: 9.4 10*3/uL (ref 4.0–10.5)
nRBC: 0 % (ref 0.0–0.2)

## 2019-03-25 LAB — VITAMIN B12: Vitamin B-12: 5145 pg/mL — ABNORMAL HIGH (ref 180–914)

## 2019-03-25 LAB — FERRITIN: Ferritin: 329 ng/mL — ABNORMAL HIGH (ref 11–307)

## 2019-03-25 LAB — IRON AND TIBC
Iron: 67 ug/dL (ref 28–170)
Saturation Ratios: 19 % (ref 10.4–31.8)
TIBC: 354 ug/dL (ref 250–450)
UIBC: 287 ug/dL

## 2019-03-25 LAB — FOLATE: Folate: 19.5 ng/mL (ref >5.9)

## 2019-03-25 LAB — LACTATE DEHYDROGENASE: LDH: 182 U/L (ref 98–192)

## 2019-03-25 LAB — VITAMIN D 25 HYDROXY (VIT D DEFICIENCY, FRACTURES): Vit D, 25-Hydroxy: 76.8 ng/mL (ref 30–100)

## 2019-03-25 NOTE — Telephone Encounter (Signed)
Called [facility] they stated that 3-18 which is her appt with [facility] is the closest available appt I called pt and let her know that and ask her if she would like for me to refer to someone else who could see her sooner that it may be [place]. She told me just to leave it at [facility] and that she would call me if it got worse to resend a referral.

## 2019-03-25 NOTE — Telephone Encounter (Signed)
Advised patient of response with verbal understanding.

## 2019-03-28 ENCOUNTER — Other Ambulatory Visit: Payer: Self-pay | Admitting: Student

## 2019-03-29 ENCOUNTER — Other Ambulatory Visit: Payer: Self-pay | Admitting: Family Medicine

## 2019-03-31 DIAGNOSIS — M159 Polyosteoarthritis, unspecified: Secondary | ICD-10-CM | POA: Diagnosis not present

## 2019-03-31 DIAGNOSIS — I69354 Hemiplegia and hemiparesis following cerebral infarction affecting left non-dominant side: Secondary | ICD-10-CM | POA: Diagnosis not present

## 2019-03-31 DIAGNOSIS — G4733 Obstructive sleep apnea (adult) (pediatric): Secondary | ICD-10-CM | POA: Diagnosis not present

## 2019-03-31 DIAGNOSIS — M5030 Other cervical disc degeneration, unspecified cervical region: Secondary | ICD-10-CM | POA: Diagnosis not present

## 2019-03-31 DIAGNOSIS — K56699 Other intestinal obstruction unspecified as to partial versus complete obstruction: Secondary | ICD-10-CM | POA: Diagnosis not present

## 2019-03-31 DIAGNOSIS — M7542 Impingement syndrome of left shoulder: Secondary | ICD-10-CM | POA: Diagnosis not present

## 2019-03-31 DIAGNOSIS — J449 Chronic obstructive pulmonary disease, unspecified: Secondary | ICD-10-CM | POA: Diagnosis not present

## 2019-03-31 DIAGNOSIS — R140 Abdominal distension (gaseous): Secondary | ICD-10-CM | POA: Diagnosis not present

## 2019-03-31 DIAGNOSIS — D320 Benign neoplasm of cerebral meninges: Secondary | ICD-10-CM | POA: Diagnosis not present

## 2019-03-31 DIAGNOSIS — E1142 Type 2 diabetes mellitus with diabetic polyneuropathy: Secondary | ICD-10-CM | POA: Diagnosis not present

## 2019-03-31 DIAGNOSIS — E785 Hyperlipidemia, unspecified: Secondary | ICD-10-CM | POA: Diagnosis not present

## 2019-04-01 ENCOUNTER — Inpatient Hospital Stay (HOSPITAL_BASED_OUTPATIENT_CLINIC_OR_DEPARTMENT_OTHER): Payer: Medicare HMO | Admitting: Nurse Practitioner

## 2019-04-01 ENCOUNTER — Telehealth: Payer: Self-pay | Admitting: Internal Medicine

## 2019-04-01 DIAGNOSIS — D509 Iron deficiency anemia, unspecified: Secondary | ICD-10-CM | POA: Diagnosis not present

## 2019-04-01 NOTE — Progress Notes (Signed)
[place] [address], [place] [address]   CLINIC:  Medical Oncology/Hematology  PCP:  [name], MD [address] [place] [address] [phone]   REASON FOR VISIT: Follow-up for iron deficiency anemia  CURRENT THERAPY: Intermittent iron infusions    INTERVAL HISTORY:  Ms. Langreck 69 y.o. female was called for a telephone interview today for her iron deficiency anemia.  Patient reports she is doing well since her last visit.  She denies any bright red bleeding per rectum or melena.  She denies any easy bruising or bleeding.  She reports the iron infusions helped with her fatigue. Denies any nausea, vomiting, or diarrhea. Denies any new pains. Had not noticed any recent bleeding such as epistaxis, hematuria or hematochezia. Denies recent chest pain on exertion, shortness of breath on minimal exertion, pre-syncopal episodes, or palpitations. Denies any numbness or tingling in hands or feet. Denies any recent fevers, infections, or recent hospitalizations. Patient reports appetite at 75% and energy level at 75%.  She is eating well maintain her weight at this time.    REVIEW OF SYSTEMS:  Review of Systems  Constitutional: Positive for fatigue.  All other systems reviewed and are negative.    PAST MEDICAL/SURGICAL HISTORY:  Past Medical History:  Diagnosis Date  . Allergy   . Anemia   . Anxiety    takes Ativan daily  . Arthritis   . Assistance needed for mobility   . Bipolar disorder ([place])    takes Risperdal nightly  . Blood transfusion   . Cancer ([place])    In her gum  . Carpal tunnel syndrome of right wrist 05/23/2011  . Cervical disc disorder with radiculopathy of cervical region 10/31/2012  . Chronic back pain   . Chronic idiopathic constipation   . Chronic neck and back pain   . Colon polyps   . COPD (chronic obstructive pulmonary disease) with chronic bronchitis ([place]) 09/16/2013   Office Spirometry 10/30/2013-submaximal  effort based on appearance of loop and curve. Numbers would fit with severe restriction but her physiologic capability may be better than this. FVC 0.91/44%, and 10.74/45%, FEV1/FVC 0.81, FEF 25-75% 1.43/69%    . Diabetes mellitus    Type II  . Diverticulosis    TCS 9/08 by Dr. Delfin Edis for diarrhea . Bx for micro scopic colitis negative.   . Fibromyalgia   . Frequent falls   . GERD (gastroesophageal reflux disease)    takes Aciphex daily  . Glaucoma    eye drops daily  . Gum symptoms    infection on antibiotic  . Hemiplegia affecting non-dominant side, post-stroke 08/02/2011  . Hiatal hernia   . Hyperlipidemia    takes Crestor daily  . Hypertension    takes Amlodipine,Metoprolol,and Clonidine daily  . Hypothyroidism    takes Synthroid daily  . IBS (irritable bowel syndrome)   . Insomnia    takes Trazodone nightly  . Major depression, recurrent ([place])    takes Zoloft daily  . 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 Metabolic encephalopathy [id number]  . Migraines    chronic headaches  . Mononeuritis lower limb   . Narcolepsy   . Osteoporosis   . Pancreatitis 2006   due to Depakote with normal EUS   . Schatzki's ring    non critical / EGD with ED 8/2011with RMR  . Seizures ([place])    takes Lamictal daily.Last seizure 3 yrs ago  . Sleep apnea    on CPAP  .  Small bowel obstruction ([place])   . Stroke [facility])    left sided weakness, speech changes  . Tubular adenoma of colon    Past Surgical History:  Procedure Laterality Date  . ABDOMINAL HYSTERECTOMY  1978  . BACK SURGERY  July 2012  . BACTERIAL OVERGROWTH TEST N/A 05/05/2013   Procedure: BACTERIAL OVERGROWTH TEST;  Surgeon: Daneil Dolin, MD;  [facility];  Service: Endoscopy;  Laterality: N/A;  7:30  . BIOPSY THYROID  2009  . BRAIN SURGERY  11/2011   resection of meningioma  . BREAST REDUCTION SURGERY  1994  . CARDIAC CATHETERIZATION  05/10/2005   normal coronaries, normal LV systolic function and EF (Dr. Jackie Plum)  . CARPAL TUNNEL RELEASE Left 07/22/04   Dr. Aline Brochure  . CATARACT EXTRACTION Bilateral   . CHOLECYSTECTOMY  1984  . COLONOSCOPY N/A 09/25/2012   [id number] diverticulosis.  colonic polyp-removed : tubular adenoma  . CRANIOTOMY  11/23/2011   Procedure: CRANIOTOMY TUMOR EXCISION;  Surgeon: Hosie Spangle, MD;  Location: [place] NEURO ORS;  Service: Neurosurgery;  Laterality: N/A;  Craniotomy for tumor resection  . ESOPHAGOGASTRODUODENOSCOPY  12/29/2010   [place]k-Retained food in the esophagus and stomach, small hiatal hernia, status post Maloney dilation of the esophagus  . ESOPHAGOGASTRODUODENOSCOPY N/A 09/25/2012   [id number] atonic baggy esophagus status post Maloney dilation 8 F. Hiatal hernia  . GIVENS CAPSULE STUDY N/A 01/15/2013   NORMAL.   . IR GENERIC HISTORICAL  03/17/2016   IR RADIOLOGIST EVAL & MGMT 03/17/2016 [facility]-INTERV RAD  . LESION REMOVAL N/A 05/31/2015   Procedure: REMOVAL RIGHT AND LEFT LESIONS OF MANDIBLE;  Surgeon: Diona Browner, DDS;  Location: [place];  Service: Oral Surgery;  Laterality: N/A;  . MALONEY DILATION  12/29/2010   RMR;  . NM MYOCAR PERF WALL MOTION  2006   "relavtiely normal" persantine, mild anterior thinning (breast attenuation artifact), no region of scar/ischemia  . OVARIAN CYST REMOVAL    . RECTOCELE REPAIR N/A 06/29/2015   Procedure: POSTERIOR REPAIR (RECTOCELE);  Surgeon: Jonnie Kind, MD;  Location: [facility];  Service: Gynecology;  Laterality: N/A;  . REDUCTION MAMMAPLASTY Bilateral   . SPINE SURGERY  09/29/2010   Dr. Rolena Infante  . surgical excision of 3 tumors from right thigh and right buttock  and left upper thigh  2010  . TOOTH EXTRACTION Bilateral 12/14/2014   Procedure: REMOVAL OF BILATERAL MANDIBULAR EXOSTOSES;  Surgeon: Diona Browner, DDS;  Location: [place];  Service: Oral Surgery;  Laterality: Bilateral;  . TRANSTHORACIC ECHOCARDIOGRAM  2010   EF 60-65%, mild conc LVH, grade 1 diastolic dysfunction; mildly calcified MV annulus with mildly thickened  leaflets, mildly calcified MR annulus     SOCIAL HISTORY:  Social History   Socioeconomic History  . Marital status: Divorced    Spouse name: Not on file  . Number of children: 1  . Years of education: 53  . Highest education level: High school graduate  Occupational History  . Occupation: Disabled  Tobacco Use  . Smoking status: Former Smoker    Packs/day: 0.50    Years: 30.00    Pack years: 15.00    Types: Cigarettes  . Smokeless tobacco: Never Used  . Tobacco comment: 5-10 cigs a day  Substance and Sexual Activity  . Alcohol use: No    Alcohol/week: 0.0 standard drinks    Comment:    . Drug use: No  . Sexual activity: Not Currently  Other Topics Concern  . Not on file  Social History Narrative   01/29/18 Lives alone, has 3 aides, Mon- Fri 8 hrs, 2 hrs on Sat-Sun, RN manages her meds   Caffeine use: Drink coffee sometimes    Right handed    Social Determinants of Health   Financial Resource Strain:   . Difficulty of Paying Living Expenses: Not on file  Food Insecurity:   . Worried About Charity fundraiser in the Last Year: Not on file  . Ran Out of Food in the Last Year: Not on file  Transportation Needs: Unknown  . Lack of Transportation (Medical): Not on file  . Lack of Transportation (Non-Medical): No  Physical Activity:   . Days of Exercise per Week: Not on file  . Minutes of Exercise per Session: Not on file  Stress:   . Feeling of Stress : Not on file  Social Connections:   . Frequency of Communication with Friends and Family: Not on file  . Frequency of Social Gatherings with Friends and Family: Not on file  . Attends Religious Services: Not on file  . Active Member of Clubs or Organizations: Not on file  . Attends Archivist Meetings: Not on file  . Marital Status: Not on file  Intimate Partner Violence:   . Fear of Current or Ex-Partner: Not on file  . Emotionally Abused: Not on file  . Physically Abused: Not on file  . Sexually  Abused: Not on file    FAMILY HISTORY:  Family History  Problem Relation Age of Onset  . Heart attack Mother        HTN  . Pneumonia Father   . Kidney failure Father   . Diabetes Father   . Pancreatic cancer Sister   . Diabetes Brother   . Hypertension Brother   . Diabetes Brother   . Cancer Sister        breast   . Hypertension Son   . Sleep apnea Son   . Cancer Sister        pancreatic  . Stroke Maternal Grandmother   . Heart attack Maternal Grandfather   . Alcohol abuse Maternal Uncle   . Colon cancer Neg Hx   . Anesthesia problems Neg Hx   . Hypotension Neg Hx   . Malignant hyperthermia Neg Hx   . Pseudochol deficiency Neg Hx   . Breast cancer Neg Hx     CURRENT MEDICATIONS:  Outpatient Encounter Medications as of 04/01/2019  Medication Sig  . ACCU-CHEK AVIVA PLUS test strip CHECK BLOOD SUGAR FOUR TIMES DAILY  . albuterol (VENTOLIN HFA) 108 (90 Base) MCG/ACT inhaler Inhale 2 puffs into the lungs every 6 (six) hours as needed for wheezing or shortness of breath.  [name] alendronate (FOSAMAX) 70 MG tablet Take 1 tablet (70 mg total) by mouth every 7 (seven) days. Take with a full glass of water on an empty stomach.  [name] amLODipine (NORVASC) 10 MG tablet TAKE 1 TABLET EVERY DAY  . ascorbic acid (VITAMIN C) 500 MG tablet Take 500 mg by mouth daily.  [name] aspirin EC 81 MG tablet Take 1 tablet (81 mg total) by mouth daily with breakfast.  . cetirizine (ZYRTEC) 10 MG tablet Take 1 tablet (10 mg total) by mouth daily.  . Cholecalciferol (VITAMIN D3) 5000 units CAPS Take 1 capsule by mouth daily.  . clobetasol cream (TEMOVATE) [id number] % Apply 1 application topically 2 (two) times daily.   . cromolyn (NASALCROM) 5.2 MG/ACT nasal spray Place 1 spray into both nostrils  4 (four) times daily.  . diclofenac sodium (VOLTAREN) 1 % GEL APPLY 2 GRAMS  TOPICALLY 4 (FOUR) TIMES DAILY.  [name] ezetimibe (ZETIA) 10 MG tablet Take 1 tablet (10 mg total) by mouth daily.  . insulin aspart (NOVOLOG FLEXPEN) 100  UNIT/ML FlexPen INJECT 10 TO 14 UNITS INTO THE SKIN 3 (THREE) TIMES DAILY WITH MEALS.  [name] Insulin Glargine, 2 Unit Dial, (TOUJEO MAX SOLOSTAR) 300 UNIT/ML SOPN Inject 25 Units into the skin at bedtime.  . lamoTRIgine (LAMICTAL) 100 MG tablet TAKE 1 TABLET TWICE DAILY  . Lancets (ACCU-CHEK SOFT TOUCH) lancets Use as instructed to check blood sugar 3 times a day.  . levothyroxine (SYNTHROID) 50 MCG tablet TAKE 1 TABLET DAILY, EXCEPT TAKE 1/2 TABLET ON SUNDAY  . linaclotide (LINZESS) 145 MCG CAPS capsule Take 1 capsule (145 mcg total) by mouth daily before breakfast.  . LORazepam (ATIVAN) 0.5 MG tablet Take 1 tablet (0.5 mg total) by mouth 3 (three) times daily.  [name] losartan (COZAAR) 50 MG tablet TAKE 1 TABLET EVERY DAY  . lubiprostone (AMITIZA) 24 MCG capsule Take 1 capsule (24 mcg total) by mouth 2 (two) times daily with a meal.  . metaxalone (SKELAXIN) 800 MG tablet metaxalone 800 mg tablet  take 1 tablet by mouth 3 times daily as needed.  . metoprolol tartrate (LOPRESSOR) 50 MG tablet TAKE 1 TABLET TWICE DAILY (NEED MD APPOINTMENT)  . montelukast (SINGULAIR) 10 MG tablet TAKE 1 TABLET EVERY DAY  . Multiple Vitamins-Minerals (HM MULTIVITAMIN ADULT GUMMY PO) Take 1 tablet by mouth daily.  [name] nystatin (MYCOSTATIN/NYSTOP) powder APPLY TO AFFECTED AREA 4 TIMES DAILY.  [name] Omega-3 Fatty Acids (FISH OIL PO) Take 1 capsule by mouth daily.  . ondansetron (ZOFRAN) 4 MG tablet Take 1 tablet (4 mg total) by mouth every 8 (eight) hours as needed for nausea or vomiting.  [name] oxyCODONE-acetaminophen (PERCOCET) 10-325 MG tablet Take 1 tablet by mouth every 8 (eight) hours as needed.   . pregabalin (LYRICA) 75 MG capsule Take 1 capsule (75 mg total) by mouth daily.  . RABEprazole (ACIPHEX) 20 MG tablet TAKE 1 TABLET TWICE DAILY  . RESTASIS 0.05 % ophthalmic emulsion Place 1 drop into both eyes 2 (two) times daily.   . risperiDONE (RISPERDAL) 0.5 MG tablet Take 1 tablet (0.5 mg total) by mouth at bedtime.  .  rosuvastatin (CRESTOR) 5 MG tablet Take 1 tablet (5 mg total) by mouth at bedtime.  . sertraline (ZOLOFT) 100 MG tablet Take 1.5 tablets (150 mg total) by mouth daily.  . Tiotropium Bromide-Olodaterol (STIOLTO RESPIMAT) 2.5-2.5 MCG/ACT AERS Inhale 2 puffs into the lungs daily.  . traZODone (DESYREL) 100 MG tablet Take 1 tablet (100 mg total) by mouth at bedtime.  . [DISCONTINUED] nystatin (MYCOSTATIN/NYSTOP) powder APPLY TO AFFECTED AREA 4 TIMES DAILY.   Facility-Administered Encounter Medications as of 04/01/2019  Medication  . 0.9 %  sodium chloride infusion    ALLERGIES:  Allergies  Allergen Reactions  . Cephalexin Hives  . Iron Nausea And Vomiting  . Milk-Related Compounds Other (See Comments)    Doesn't agree with stomach.   . Penicillins Hives    Has patient had a PCN reaction causing immediate rash, facial/tongue/throat swelling, SOB or lightheadedness with hypotension: Yes Has patient had a PCN reaction causing severe rash involving mucus membranes or skin necrosis: No Has patient had a PCN reaction that required hospitalization No Has patient had a PCN reaction occurring within the last 10 years: No If all of the above answers  are "NO", then may proceed with Cephalosporin use.   [name] Phenazopyridine Hcl Hives           Vital signs: -Deferred due to telephone visit  Physical Exam -Deferred due to telephone visit -Patient was alert and oriented over the phone and in no acute distress.  LABORATORY DATA:  I have reviewed the labs as listed.  CBC    Component Value Date/Time   WBC 9.4 03/25/2019 1245   RBC 5.27 (H) 03/25/2019 1245   HGB 11.6 (L) 03/25/2019 1245   HGB 10.3 (L) 01/29/2018 1315   HCT 41.3 03/25/2019 1245   HCT 33.8 (L) 01/29/2018 1315   PLT 278 03/25/2019 1245   PLT 275 01/29/2018 1315   MCV 78.4 (L) 03/25/2019 1245   MCV 75 (L) 01/29/2018 1315   MCH 22.0 (L) 03/25/2019 1245   MCHC 28.1 (L) 03/25/2019 1245   RDW 13.9 03/25/2019 1245   RDW 12.8  01/29/2018 1315   LYMPHSABS 2.2 03/25/2019 1245   LYMPHSABS 2.4 01/29/2018 1315   MONOABS 0.8 03/25/2019 1245   EOSABS 0.2 03/25/2019 1245   EOSABS 0.2 01/29/2018 1315   BASOSABS 0.0 03/25/2019 1245   BASOSABS 0.0 01/29/2018 1315   CMP Latest Ref Rng & Units 03/25/2019 02/12/2019 12/13/2018  Glucose 70 - 99 mg/dL 163(H) 167(H) 174(H)  BUN 8 - 23 mg/dL 31(H) 32(H) 17  Creatinine 0.44 - 1.00 mg/dL 1.03(H) 1.00 0.97  Sodium 135 - 145 mmol/L 139 146(H) 142  Potassium 3.5 - 5.1 mmol/L 4.1 4.3 3.3(L)  Chloride 98 - 111 mmol/L 107 108(H) 110  CO2 22 - 32 mmol/L 25 21 24   Calcium 8.9 - 10.3 mg/dL 9.3 9.5 8.5(L)  Total Protein 6.5 - 8.1 g/dL 7.9 6.8 6.4(L)  Total Bilirubin 0.3 - 1.2 mg/dL 0.6 0.2 0.3  Alkaline Phos 38 - 126 U/L 116 129(H) 80  AST 15 - 41 U/L 18 17 23   ALT 0 - 44 U/L 24 15 23     All questions were answered to patient's stated satisfaction. Encouraged patient to call with any new concerns or questions before his next visit to the cancer center and we can certain see him sooner, if needed.     ASSESSMENT & PLAN:   Microcytic anemia 1.  Microcytic anemia: -This is a combination anemia from iron deficiency and chronic kidney disease. -Colonoscopy on 02/10/2016 showing 2 sessile polyps in the descending colon, diverticula in the sigmoid colon. -EGD done on 02/10/2016 showing mild to moderate esophagitis, erythema in the inferior gastric antrum. - She was placed on iron pills, but could not tolerate them due to constipation. -She denies any bright red bleeding per rectum or melena. - She last received IV Feraheme x2 doses in 12/31/2018 and 01/10/2019.  She did experience some nausea and itching after her last treatment the premedication helps this. -Labs done on 03/25/2019 showed hemoglobin 11.6, ferritin 329, percent saturation 19, platelets 278. -She does not need any IV iron at this time.  We will recheck her in 3 months. -She will follow-up in 3 months with repeat labs.       Orders placed this encounter:  Orders Placed This Encounter  Procedures  . Lactate dehydrogenase  . CBC with Differential/Platelet  . Comprehensive metabolic panel  . Ferritin  . Iron and TIBC  . Vitamin B12  . VITAMIN D 25 Hydroxy (Vit-D Deficiency, Fractures)  . Folate    I provided 20 minutes of non face-to-face telephone visit time during this encounter, and >  50% was spent counseling as documented under my assessment & plan.   Francene Finders, FNP-C [place] [phone]

## 2019-04-01 NOTE — Telephone Encounter (Signed)
Patient ph# [phone] called re: Patient received a letter from [facility] stating that the following medications need PA before the medications can be sent to patient:   insulin aspart (NOVOLOG FLEXPEN) 100 UNIT/ML FlexPen  AND Insulin Glargine, 2 Unit Dial, (TOUJEO MAX SOLOSTAR) 300 UNIT/ML SOPN

## 2019-04-01 NOTE — Assessment & Plan Note (Addendum)
1.  Microcytic anemia: -This is a combination anemia from iron deficiency and chronic kidney disease. -Colonoscopy on 02/10/2016 showing 2 sessile polyps in the descending colon, diverticula in the sigmoid colon. -EGD done on 02/10/2016 showing mild to moderate esophagitis, erythema in the inferior gastric antrum. - She was placed on iron pills, but could not tolerate them due to constipation. -She denies any bright red bleeding per rectum or melena. - She last received IV Feraheme x2 doses in 12/31/2018 and 01/10/2019.  She did experience some nausea and itching after her last treatment the premedication helps this. -Labs done on 03/25/2019 showed hemoglobin 11.6, ferritin 329, percent saturation 19, platelets 278. -She does not need any IV iron at this time.  We will recheck her in 3 months. -She will follow-up in 3 months with repeat labs.

## 2019-04-02 NOTE — Telephone Encounter (Signed)
Spoke to patient and let her know we are working on this.

## 2019-04-05 IMAGING — MG DIGITAL SCREENING BILATERAL MAMMOGRAM WITH TOMO AND CAD
6 of 10 series · 6 of 30 positions shown · non-contrast
Comparison: Previous exam(s).

ACR Breast Density Category a: The breast tissue is almost entirely
fatty.

CLINICAL DATA: Screening.

EXAM:
DIGITAL SCREENING BILATERAL MAMMOGRAM WITH TOMO AND CAD

[R MLO synth-2D]
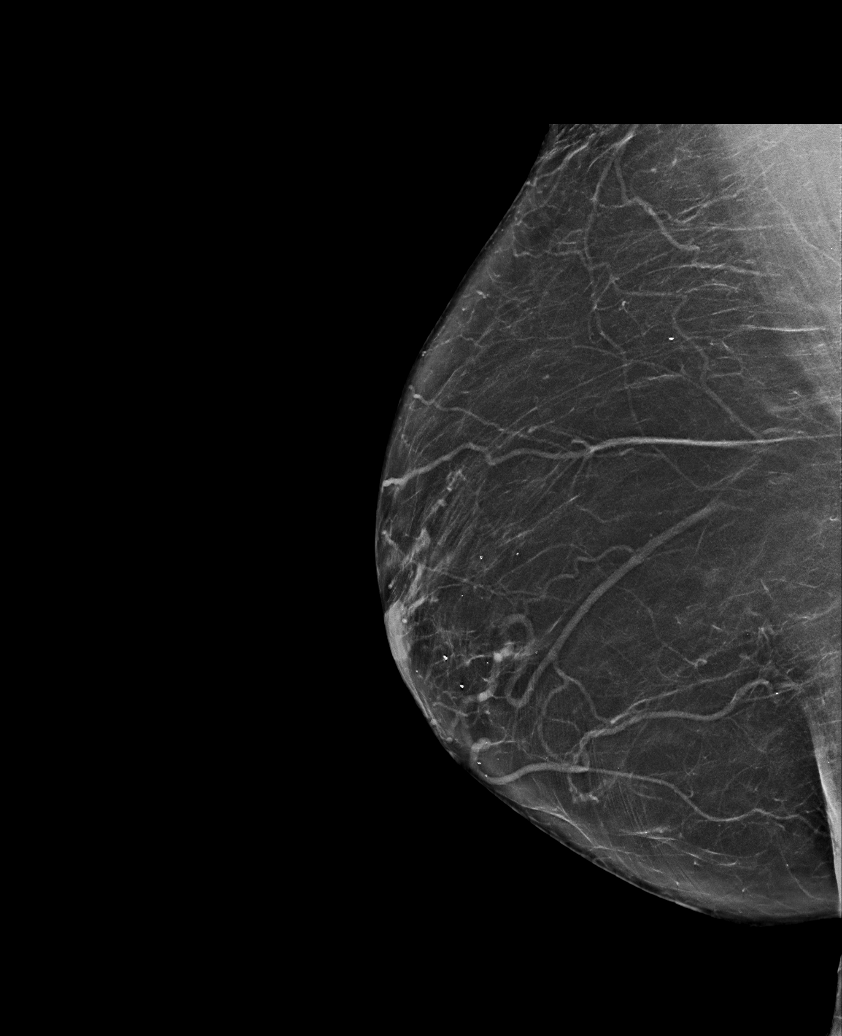

[L CC synth-2D (1 of 2)]
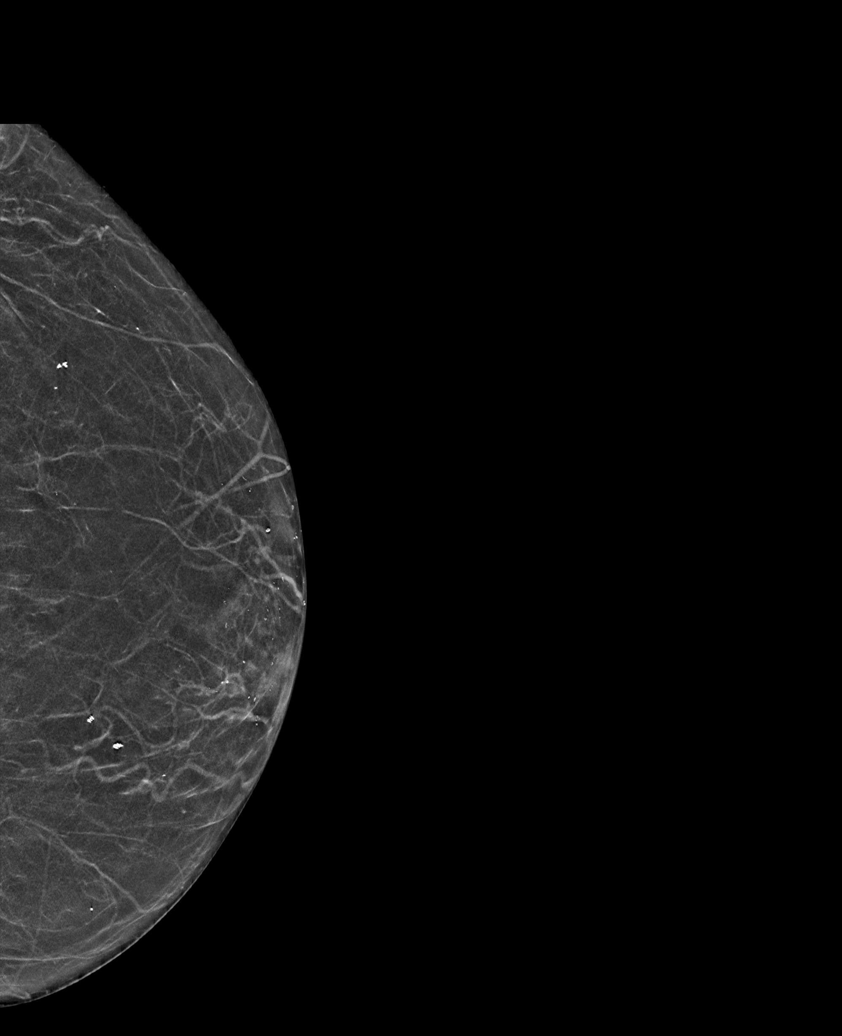

[L MLO synth-2D]
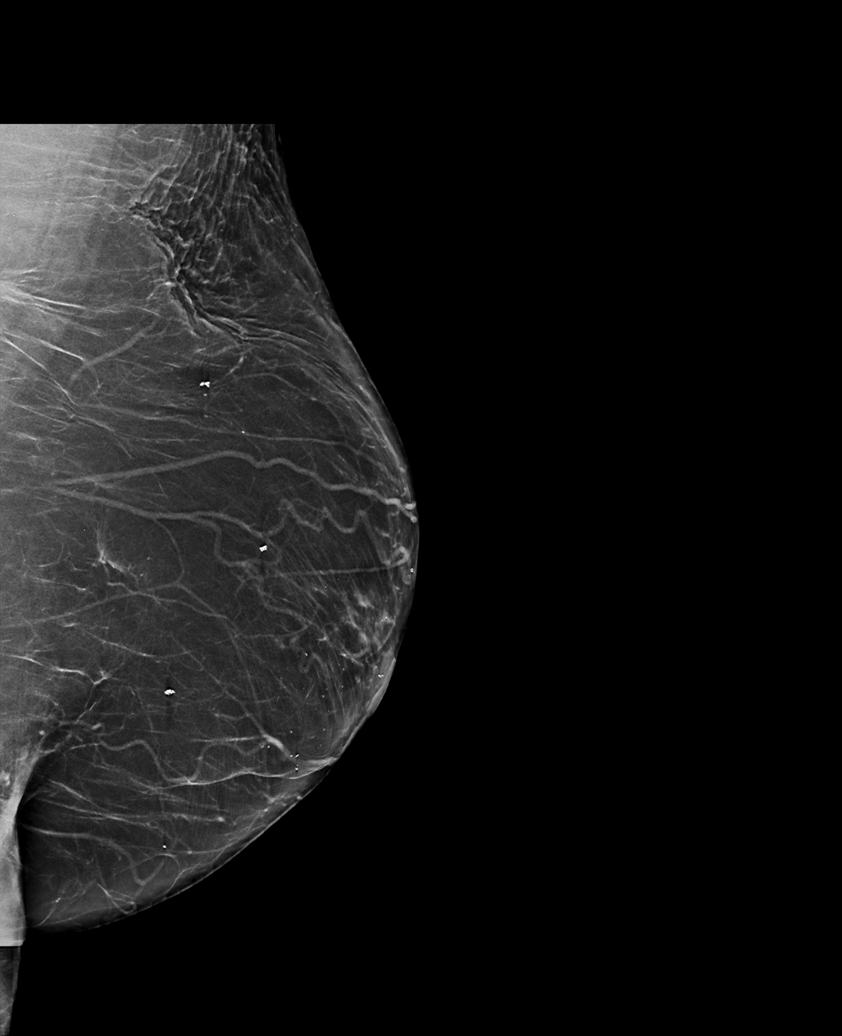

[L CC synth-2D (2 of 2)]
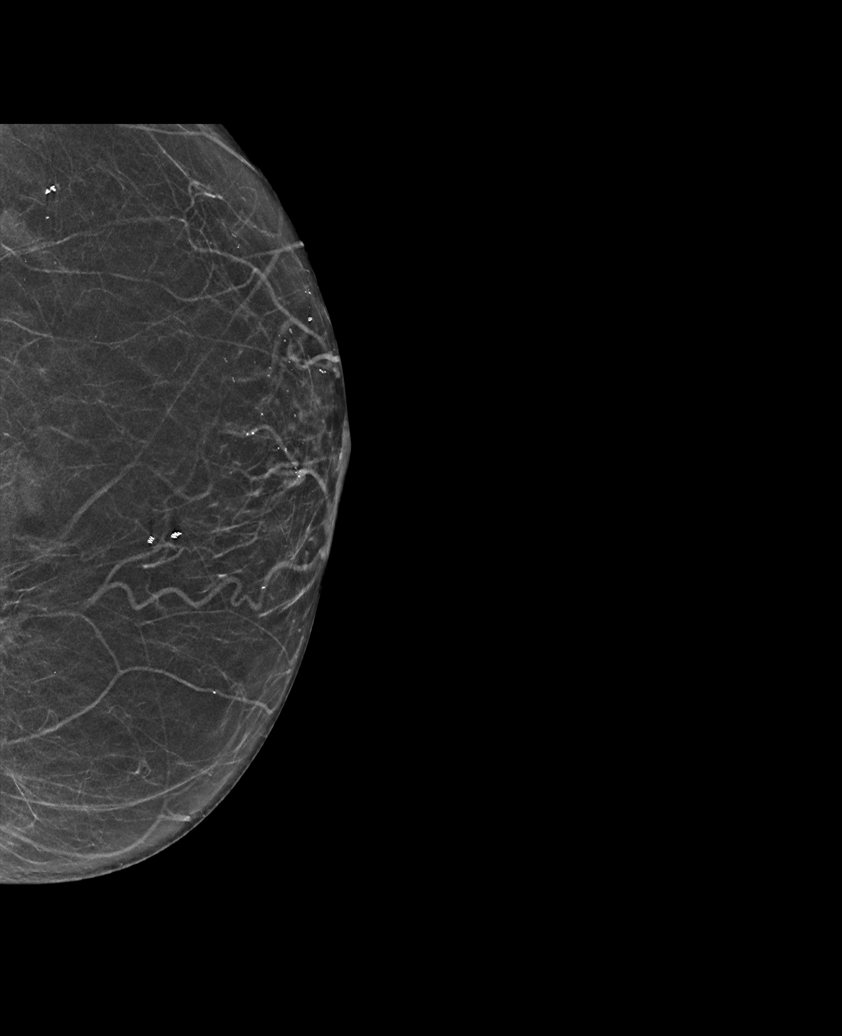

[R CC synth-2D]
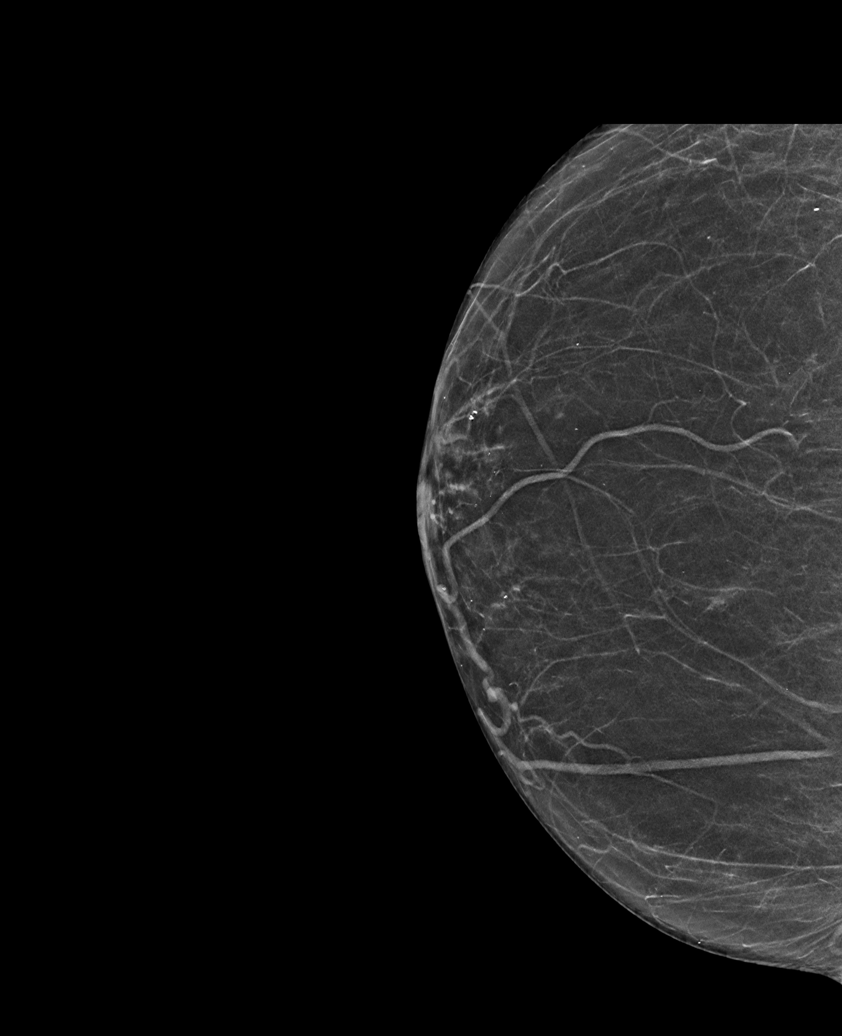

[L CC tomo · tomo slice 24/47.0]
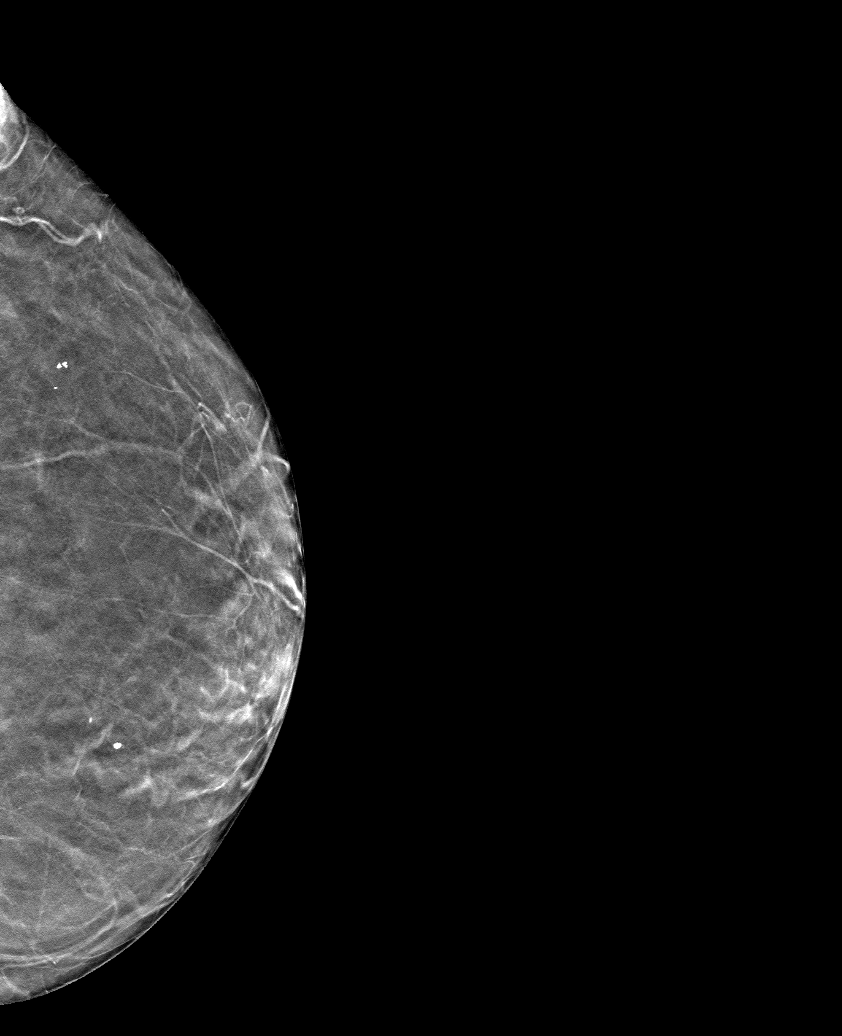

[6 of 30 positions shown; findings below may reference images not displayed]

FINDINGS: There are no findings suspicious for malignancy. Images were
processed with CAD.
IMPRESSION: No mammographic evidence of malignancy. A result letter of this
screening mammogram will be mailed directly to the patient.

RECOMMENDATION:
Screening mammogram in one year. (Code:[id number]-[id number])

BI-RADS CATEGORY  1: Negative.

## 2019-04-07 ENCOUNTER — Other Ambulatory Visit: Payer: Self-pay | Admitting: Physician Assistant

## 2019-04-08 ENCOUNTER — Ambulatory Visit (INDEPENDENT_AMBULATORY_CARE_PROVIDER_SITE_OTHER): Payer: Medicare HMO | Admitting: Psychiatry

## 2019-04-08 ENCOUNTER — Other Ambulatory Visit: Payer: Self-pay

## 2019-04-08 DIAGNOSIS — J449 Chronic obstructive pulmonary disease, unspecified: Secondary | ICD-10-CM | POA: Diagnosis not present

## 2019-04-08 DIAGNOSIS — K56699 Other intestinal obstruction unspecified as to partial versus complete obstruction: Secondary | ICD-10-CM | POA: Diagnosis not present

## 2019-04-08 DIAGNOSIS — E1142 Type 2 diabetes mellitus with diabetic polyneuropathy: Secondary | ICD-10-CM | POA: Diagnosis not present

## 2019-04-08 DIAGNOSIS — M7542 Impingement syndrome of left shoulder: Secondary | ICD-10-CM | POA: Diagnosis not present

## 2019-04-08 DIAGNOSIS — I69354 Hemiplegia and hemiparesis following cerebral infarction affecting left non-dominant side: Secondary | ICD-10-CM | POA: Diagnosis not present

## 2019-04-08 DIAGNOSIS — M5030 Other cervical disc degeneration, unspecified cervical region: Secondary | ICD-10-CM | POA: Diagnosis not present

## 2019-04-08 DIAGNOSIS — D320 Benign neoplasm of cerebral meninges: Secondary | ICD-10-CM | POA: Diagnosis not present

## 2019-04-08 DIAGNOSIS — F331 Major depressive disorder, recurrent, moderate: Secondary | ICD-10-CM

## 2019-04-08 DIAGNOSIS — R140 Abdominal distension (gaseous): Secondary | ICD-10-CM | POA: Diagnosis not present

## 2019-04-08 DIAGNOSIS — M159 Polyosteoarthritis, unspecified: Secondary | ICD-10-CM | POA: Diagnosis not present

## 2019-04-08 NOTE — Progress Notes (Signed)
Virtual Visit via Telephone Note  I connected with [name] on 04/08/19 at 9:15 AM EST by telephone and verified that I am speaking with the correct person using two identifiers.   I discussed the limitations, risks, security and privacy concerns of performing an evaluation and management service by telephone and the availability of in person appointments. I also discussed with the patient that there may be a patient responsible charge related to this service. The patient expressed understanding and agreed to proceed.  I provided 40  minutes of non-face-to-face time during this encounter.   [name], LCSW             THERAPIST PROGRESS NOTE  Session Time: Tuesday 04/08/2019 9:15 AM - 9:55 AM   Participation Level: Active  Behavioral Response: lethargic, depressed, anxious  Type of Therapy: Individual Therapy  Treatment Goals addressed: Learn and implement behavioral strategies to overcome depression.                                                       Verbalize an understanding and resolution of current interpersonal problems.   Interventions: CBT and Supportive  Summary: [name] is a 69 y.o. female who presents with  long standing history of recurrent periods  of depression beginning when she was 68 and her favorite uncle died. Patient reports multiple psychiatric hospitlaizations due to depression and suicidal ideations with the last one occuring in 1997. Patient has participated in outpatient psychotherapy and medication management intermittently since age 87. She currently is seeing psychiatrist Dr. Harrington Challenger . Prior to this, she was seen at [facility]. Patient also has had ECT at [facility]. Symptoms have worsened in recent months due to family stress and have  included depressed mood, anxiety, excessive worry, and tearfulness.         Patient's last contact was by virtual visit via telephone about 4 weeks ago. She states reports continued fluctuation in symptoms of  depression including sadness, sleep difficulty, and irritability along with tearfulness.She reports increased back pain which also has contributed to sleep difficulty.  She plans to contact doctor about possibility of injections to alleviate pain.   She expresses continued frustration her son doesn't visit or call her as much as she wishes.  She expresses disappointment regarding visit to his home for Christmas.  Per her report, he and his girlfriend invited several other people and they were not wearing masks. Patient feared possible Covid 19 exposure.  She also reports stress related to her current aide.  Patient suspects aide has been stealing clothing items.  Per her report, she confronted aide who denied allegations.  She reports increased support from a cousin who talks to her daily and from another cousin who is a Company secretary and called her last night.  Patient continues to use her spirituality to develop coping statements.     Suicidal/Homicidal: Nowithout intent/plan  Therapist Response: Reviewed symptoms, praised and reinforced patient's contact with cousins and use of her spirituality, discussed effects, discussed stressors, facilitated expression of thoughts and feelings, validated feelings, assisted patient examine her thought patterns regarding expectations of son, assisted patient develop realistic expectations about interaction with son,  Plan: Return again in 2 weeks.  Diagnosis: Axis I: MDD, Recurrent, moderate    Axis II: Deferred    Albi Rappaport E Monty Mccarrell,  LCSW

## 2019-04-09 ENCOUNTER — Other Ambulatory Visit: Payer: Self-pay | Admitting: Family Medicine

## 2019-04-11 ENCOUNTER — Other Ambulatory Visit: Payer: Self-pay | Admitting: Internal Medicine

## 2019-04-11 ENCOUNTER — Telehealth: Payer: Self-pay

## 2019-04-11 NOTE — Telephone Encounter (Signed)
Per patient insurance Nelva Nay is a covered medication that does not need a PA.  Letter sent to scanning.

## 2019-04-14 ENCOUNTER — Telehealth: Payer: Self-pay

## 2019-04-14 DIAGNOSIS — G4733 Obstructive sleep apnea (adult) (pediatric): Secondary | ICD-10-CM | POA: Diagnosis not present

## 2019-04-14 DIAGNOSIS — J449 Chronic obstructive pulmonary disease, unspecified: Secondary | ICD-10-CM | POA: Diagnosis not present

## 2019-04-14 DIAGNOSIS — I10 Essential (primary) hypertension: Secondary | ICD-10-CM | POA: Diagnosis not present

## 2019-04-14 DIAGNOSIS — E785 Hyperlipidemia, unspecified: Secondary | ICD-10-CM | POA: Diagnosis not present

## 2019-04-14 NOTE — Telephone Encounter (Signed)
Kim with [facility], lvm for nurse to call

## 2019-04-14 NOTE — Telephone Encounter (Signed)
LVM returning Kims call with [facility] no answer

## 2019-04-15 DIAGNOSIS — R32 Unspecified urinary incontinence: Secondary | ICD-10-CM | POA: Diagnosis not present

## 2019-04-17 DIAGNOSIS — D649 Anemia, unspecified: Secondary | ICD-10-CM

## 2019-04-17 DIAGNOSIS — Z794 Long term (current) use of insulin: Secondary | ICD-10-CM

## 2019-04-17 DIAGNOSIS — F17210 Nicotine dependence, cigarettes, uncomplicated: Secondary | ICD-10-CM

## 2019-04-17 DIAGNOSIS — E1165 Type 2 diabetes mellitus with hyperglycemia: Secondary | ICD-10-CM | POA: Diagnosis not present

## 2019-04-17 DIAGNOSIS — K5730 Diverticulosis of large intestine without perforation or abscess without bleeding: Secondary | ICD-10-CM

## 2019-04-17 DIAGNOSIS — Z7982 Long term (current) use of aspirin: Secondary | ICD-10-CM

## 2019-04-17 DIAGNOSIS — K589 Irritable bowel syndrome without diarrhea: Secondary | ICD-10-CM

## 2019-04-17 DIAGNOSIS — Z79899 Other long term (current) drug therapy: Secondary | ICD-10-CM

## 2019-04-17 DIAGNOSIS — F250 Schizoaffective disorder, bipolar type: Secondary | ICD-10-CM | POA: Diagnosis not present

## 2019-04-17 DIAGNOSIS — M160 Bilateral primary osteoarthritis of hip: Secondary | ICD-10-CM | POA: Diagnosis not present

## 2019-04-17 DIAGNOSIS — I10 Essential (primary) hypertension: Secondary | ICD-10-CM

## 2019-04-17 DIAGNOSIS — J42 Unspecified chronic bronchitis: Secondary | ICD-10-CM

## 2019-04-17 DIAGNOSIS — E039 Hypothyroidism, unspecified: Secondary | ICD-10-CM

## 2019-04-17 DIAGNOSIS — J449 Chronic obstructive pulmonary disease, unspecified: Secondary | ICD-10-CM

## 2019-04-17 DIAGNOSIS — K219 Gastro-esophageal reflux disease without esophagitis: Secondary | ICD-10-CM

## 2019-04-17 DIAGNOSIS — E1142 Type 2 diabetes mellitus with diabetic polyneuropathy: Secondary | ICD-10-CM | POA: Diagnosis not present

## 2019-04-17 DIAGNOSIS — R296 Repeated falls: Secondary | ICD-10-CM

## 2019-04-18 ENCOUNTER — Ambulatory Visit
Admission: RE | Admit: 2019-04-18 | Discharge: 2019-04-18 | Disposition: A | Discharge status: Home / Self Care | Payer: Medicare HMO | Source: Ambulatory Visit | Attending: Radiation Oncology | Admitting: Radiation Oncology

## 2019-04-18 DIAGNOSIS — D329 Benign neoplasm of meninges, unspecified: Secondary | ICD-10-CM

## 2019-04-18 DIAGNOSIS — Z9181 History of falling: Secondary | ICD-10-CM | POA: Diagnosis not present

## 2019-04-18 DIAGNOSIS — I10 Essential (primary) hypertension: Secondary | ICD-10-CM | POA: Diagnosis not present

## 2019-04-18 DIAGNOSIS — M179 Osteoarthritis of knee, unspecified: Secondary | ICD-10-CM | POA: Diagnosis not present

## 2019-04-18 MED ORDER — GADOBENATE DIMEGLUMINE 529 MG/ML IV SOLN
15.0000 mL | Freq: Once | INTRAVENOUS | Status: AC | PRN
  Administered 2019-04-18: 12:00:00 15 mL via INTRAVENOUS

## 2019-04-21 ENCOUNTER — Encounter: Payer: Self-pay | Admitting: Family Medicine

## 2019-04-21 ENCOUNTER — Other Ambulatory Visit: Payer: Self-pay

## 2019-04-21 ENCOUNTER — Telehealth (INDEPENDENT_AMBULATORY_CARE_PROVIDER_SITE_OTHER): Payer: Medicare HMO | Admitting: Family Medicine

## 2019-04-21 ENCOUNTER — Inpatient Hospital Stay: Payer: Medicare HMO | Attending: Neurosurgery

## 2019-04-21 VITALS — BP 140/59 | Ht 59.0 in | Wt 162.0 lb

## 2019-04-21 DIAGNOSIS — Z79899 Other long term (current) drug therapy: Secondary | ICD-10-CM

## 2019-04-21 DIAGNOSIS — E1143 Type 2 diabetes mellitus with diabetic autonomic (poly)neuropathy: Secondary | ICD-10-CM

## 2019-04-21 DIAGNOSIS — E039 Hypothyroidism, unspecified: Secondary | ICD-10-CM | POA: Diagnosis not present

## 2019-04-21 DIAGNOSIS — I10 Essential (primary) hypertension: Secondary | ICD-10-CM

## 2019-04-21 DIAGNOSIS — F17210 Nicotine dependence, cigarettes, uncomplicated: Secondary | ICD-10-CM | POA: Diagnosis not present

## 2019-04-21 DIAGNOSIS — Z720 Tobacco use: Secondary | ICD-10-CM

## 2019-04-21 DIAGNOSIS — M5010 Cervical disc disorder with radiculopathy, unspecified cervical region: Secondary | ICD-10-CM

## 2019-04-21 DIAGNOSIS — E785 Hyperlipidemia, unspecified: Secondary | ICD-10-CM | POA: Diagnosis not present

## 2019-04-21 DIAGNOSIS — F3170 Bipolar disorder, currently in remission, most recent episode unspecified: Secondary | ICD-10-CM | POA: Diagnosis not present

## 2019-04-21 NOTE — Patient Instructions (Addendum)
F/U in office in 4.5  months ( either Provider) , call if you need me sooner  Please reduce fried and fatty foods  Please stop your multivitamin , your vit B level is extremely high  Increase vegetable and water intake.  Now smoking 10 cigarettes/ day, please work on quitting by your birthday, if not befoire    Increase calcium 500 mg to one twice daily    Stop once weekly  vitamin D as soon as you finish current package  Start OTC once daily vit D3 , 1000 IU once every day  Be careful not to fall  Thanks for choosing [facility], we consider it a privelige to serve you.

## 2019-04-21 NOTE — Progress Notes (Signed)
Virtual Visit via Telephone Note  I connected with [name] on 04/21/19 at  1:00 PM EST by video  and verified that I am speaking with the correct person using two identifiers. Home health nurse is present at visit, and is necessary for safe  Medication administration for patient on a 2 weekly basis medication is packaged for patient , and in home nurse visit carried out Location: Patient: home Provider: office   I discussed the limitations, risks, security and privacy concerns of performing an evaluation and management service by telephone and the availability of in person appointments. I also discussed with the patient that there may be a patient responsible charge related to this service. The patient expressed understanding and agreed to proceed.   History of Present Illness: F/U chronic problems, medication review, and refill medication when necessary. Review most recent labs and order labs which are due Review preventive health and update with necessary referrals or immunizations as indicated Denies recent fever or chills. Denies sinus pressure, nasal congestion, ear pain or sore throat. Denies chest congestion, productive cough or wheezing. Denies chest pains, palpitations and leg swelling Denies abdominal pain, nausea, vomiting,diarrhea or constipation.   Denies dysuria, frequency.. C/o increased neck and upper arm pain, being treated by pain management, no new weakness or numbness. Denies headaches, seizures,. Denies uncontrolled  depression, anxiety or insomnia. Denies skin break down or rash. Denies polyuria, polydipsia, blurred vision , or hypoglycemic episodes.       Observations/Objective: BP (!) 140/59   Ht 4\' 11"  (1.499 m)   Wt 162 lb (73.5 kg)   BMI 32.72 kg/m  Good communication with no confusion and intact memory. Alert and oriented x 3 No signs of respiratory distress during speech    Assessment and Plan: Essential hypertension Adequate control  though systolic pressure elevated slightly, will address at next visit DASH diet and commitment to daily physical activity for a minimum of 30 minutes discussed and encouraged, as a part of hypertension management. The importance of attaining a healthy weight is also discussed.  BP/Weight 04/21/2019 02/17/2019 02/04/2019 02/03/2019 01/22/2019 01/10/2019 0000000  Systolic BP [id number] 0000000 [id number]  Diastolic BP 59 80 60 64 54 52 60  Wt. (Lbs) 162 169 168.2 168 167.8 - 169.6  BMI 32.72 34.13 33.97 33.93 33.89 - 34.26  Some encounter information is confidential and restricted. Go to Review Flowsheets activity to see all data.       Bipolar disorder ([place]) Managed by Ortho, and controlled   Hypothyroidism Controlled, no change in medication   Hyperlipidemia Hyperlipidemia:Low fat diet discussed and encouraged.   Lipid Panel  Lab Results  Component Value Date   CHOL 187 02/12/2019   HDL 46 02/12/2019   LDLCALC 106 (H) 02/12/2019   TRIG 202 (H) 02/12/2019   CHOLHDL 3.5 09/30/2018   Need to reduce fat intake    Well controlled type 2 diabetes mellitus with gastroparesis ([place]) Managed by Endo and controlled, no change in mamanagement  Tobacco user Asked:confirms currently smokes cigarettes Assess: Unwilling to quit but cutting back Advise: needs to QUIT to reduce risk of cancer, cardio and cerebrovascular disease Assist: counseled for 5 minutes and literature provided Arrange: follow up in 3 months   Polypharmacy Patient is maintained on more than 15 tablets daily, needs home health nurse oversight, justification present for medication management   Cervical disc disorder with radiculopathy of cervical region increased and uncontrolled right neck pain, will continue with pain management  Follow Up Instructions:    I discussed the assessment and treatment plan with the patient. The patient was provided an opportunity to ask questions and all were answered. The  patient agreed with the plan and demonstrated an understanding of the instructions.   The patient was advised to call back or seek an in-person evaluation if the symptoms worsen or if the condition fails to improve as anticipated.  I provided 20 minutes of face-to-face time during video  this encounter.   Tula Nakayama, MD

## 2019-04-22 ENCOUNTER — Ambulatory Visit (INDEPENDENT_AMBULATORY_CARE_PROVIDER_SITE_OTHER): Payer: Medicare HMO | Admitting: Podiatry

## 2019-04-22 ENCOUNTER — Encounter: Payer: Self-pay | Admitting: Podiatry

## 2019-04-22 ENCOUNTER — Other Ambulatory Visit: Payer: Self-pay

## 2019-04-22 DIAGNOSIS — M79676 Pain in unspecified toe(s): Secondary | ICD-10-CM | POA: Diagnosis not present

## 2019-04-22 DIAGNOSIS — E1142 Type 2 diabetes mellitus with diabetic polyneuropathy: Secondary | ICD-10-CM | POA: Diagnosis not present

## 2019-04-22 DIAGNOSIS — Q828 Other specified congenital malformations of skin: Secondary | ICD-10-CM | POA: Diagnosis not present

## 2019-04-22 DIAGNOSIS — B351 Tinea unguium: Secondary | ICD-10-CM

## 2019-04-22 NOTE — Patient Instructions (Signed)
Diabetes Mellitus and Foot Care Foot care is an important part of your health, especially when you have diabetes. Diabetes may cause you to have problems because of poor blood flow (circulation) to your feet and legs, which can cause your skin to:  Become thinner and drier.  Break more easily.  Heal more slowly.  Peel and crack. You may also have nerve damage (neuropathy) in your legs and feet, causing decreased feeling in them. This means that you may not notice minor injuries to your feet that could lead to more serious problems. Noticing and addressing any potential problems early is the best way to prevent future foot problems. How to care for your feet Foot hygiene  Wash your feet daily with warm water and mild soap. Do not use hot water. Then, pat your feet and the areas between your toes until they are completely dry. Do not soak your feet as this can dry your skin.  Trim your toenails straight across. Do not dig under them or around the cuticle. File the edges of your nails with an emery board or nail file.  Apply a moisturizing lotion or petroleum jelly to the skin on your feet and to dry, brittle toenails. Use lotion that does not contain alcohol and is unscented. Do not apply lotion between your toes. Shoes and socks  Wear clean socks or stockings every day. Make sure they are not too tight. Do not wear knee-high stockings since they may decrease blood flow to your legs.  Wear shoes that fit properly and have enough cushioning. Always look in your shoes before you put them on to be sure there are no objects inside.  To break in new shoes, wear them for just a few hours a day. This prevents injuries on your feet. Wounds, scrapes, corns, and calluses  Check your feet daily for blisters, cuts, bruises, sores, and redness. If you cannot see the bottom of your feet, use a mirror or ask someone for help.  Do not cut corns or calluses or try to remove them with medicine.  If you  find a minor scrape, cut, or break in the skin on your feet, keep it and the skin around it clean and dry. You may clean these areas with mild soap and water. Do not clean the area with peroxide, alcohol, or iodine.  If you have a wound, scrape, corn, or callus on your foot, look at it several times a day to make sure it is healing and not infected. Check for: ? Redness, swelling, or pain. ? Fluid or blood. ? Warmth. ? Pus or a bad smell. General instructions  Do not cross your legs. This may decrease blood flow to your feet.  Do not use heating pads or hot water bottles on your feet. They may burn your skin. If you have lost feeling in your feet or legs, you may not know this is happening until it is too late.  Protect your feet from hot and cold by wearing shoes, such as at the beach or on hot pavement.  Schedule a complete foot exam at least once a year (annually) or more often if you have foot problems. If you have foot problems, report any cuts, sores, or bruises to your health care provider immediately. Contact a health care provider if:  You have a medical condition that increases your risk of infection and you have any cuts, sores, or bruises on your feet.  You have an injury that is not   healing.  You have redness on your legs or feet.  You feel burning or tingling in your legs or feet.  You have pain or cramps in your legs and feet.  Your legs or feet are numb.  Your feet always feel cold.  You have pain around a toenail. Get help right away if:  You have a wound, scrape, corn, or callus on your foot and: ? You have pain, swelling, or redness that gets worse. ? You have fluid or blood coming from the wound, scrape, corn, or callus. ? Your wound, scrape, corn, or callus feels warm to the touch. ? You have pus or a bad smell coming from the wound, scrape, corn, or callus. ? You have a fever. ? You have a red line going up your leg. Summary  Check your feet every day  for cuts, sores, red spots, swelling, and blisters.  Moisturize feet and legs daily.  Wear shoes that fit properly and have enough cushioning.  If you have foot problems, report any cuts, sores, or bruises to your health care provider immediately.  Schedule a complete foot exam at least once a year (annually) or more often if you have foot problems. This information is not intended to replace advice given to you by your health care provider. Make sure you discuss any questions you have with your health care provider. Document Revised: 11/20/2018 Document Reviewed: 03/31/2016 Elsevier Patient Education  2020 Elsevier Inc.  

## 2019-04-23 ENCOUNTER — Encounter: Payer: Self-pay | Admitting: Family Medicine

## 2019-04-23 NOTE — Assessment & Plan Note (Signed)
Asked:confirms currently smokes cigarettes Assess: Unwilling to quit but cutting back Advise: needs to QUIT to reduce risk of cancer, cardio and cerebrovascular disease Assist: counseled for 5 minutes and literature provided Arrange: follow up in 3 months  

## 2019-04-23 NOTE — Assessment & Plan Note (Signed)
Hyperlipidemia:Low fat diet discussed and encouraged.   Lipid Panel  Lab Results  Component Value Date   CHOL 187 02/12/2019   HDL 46 02/12/2019   LDLCALC 106 (H) 02/12/2019   TRIG 202 (H) 02/12/2019   CHOLHDL 3.5 09/30/2018   Need to reduce fat intake

## 2019-04-23 NOTE — Assessment & Plan Note (Signed)
Managed by Ortho, and controlled

## 2019-04-23 NOTE — Assessment & Plan Note (Signed)
Managed by Endo and controlled, no change in mamanagement

## 2019-04-23 NOTE — Assessment & Plan Note (Signed)
Patient is maintained on more than 15 tablets daily, needs home health nurse oversight, justification present for medication management

## 2019-04-23 NOTE — Assessment & Plan Note (Signed)
increased and uncontrolled right neck pain, will continue with pain management

## 2019-04-23 NOTE — Assessment & Plan Note (Signed)
Controlled, no change in medication  

## 2019-04-23 NOTE — Assessment & Plan Note (Signed)
Adequate control though systolic pressure elevated slightly, will address at next visit DASH diet and commitment to daily physical activity for a minimum of 30 minutes discussed and encouraged, as a part of hypertension management. The importance of attaining a healthy weight is also discussed.  BP/Weight 04/21/2019 02/17/2019 02/04/2019 02/03/2019 01/22/2019 01/10/2019 0000000  Systolic BP [id number] 0000000 [id number]  Diastolic BP 59 80 60 64 54 52 60  Wt. (Lbs) 162 169 168.2 168 167.8 - 169.6  BMI 32.72 34.13 33.97 33.93 33.89 - 34.26  Some encounter information is confidential and restricted. Go to Review Flowsheets activity to see all data.

## 2019-04-25 DIAGNOSIS — D329 Benign neoplasm of meninges, unspecified: Secondary | ICD-10-CM | POA: Diagnosis not present

## 2019-04-25 NOTE — Progress Notes (Signed)
Subjective: [name] presents today for follow up of preventative diabetic foot care and painful mycotic nails b/l that are difficult to trim. Pain interferes with ambulation. Aggravating factors include wearing enclosed shoe gear. Pain is relieved with periodic professional debridement.   Allergies  Allergen Reactions  . Cephalexin Hives  . Iron Nausea And Vomiting  . Milk-Related Compounds Other (See Comments)    Doesn't agree with stomach.   . Penicillins Hives    Has patient had a PCN reaction causing immediate rash, facial/tongue/throat swelling, SOB or lightheadedness with hypotension: Yes Has patient had a PCN reaction causing severe rash involving mucus membranes or skin necrosis: No Has patient had a PCN reaction that required hospitalization No Has patient had a PCN reaction occurring within the last 10 years: No If all of the above answers are "NO", then may proceed with Cephalosporin use.   [name] Phenazopyridine Hcl Hives           Objective: There were no vitals filed for this visit.  Vascular Examination:  Capillary refill time to digits immediate b/l, faintly palpable pedal pulses b/l, pedal hair sparse b/l and skin temperature gradient within normal limits b/l  Dermatological Examination: Pedal skin with normal turgor, texture and tone bilaterally, toenails 1-5 b/l elongated, dystrophic, thickened, crumbly with subungual debris and porokeratotic lesion(s) submet head 1 right, plantarlateral right foot and plantar heel left foot. No erythema, no edema, no drainage, no flocculence  Musculoskeletal: Normal muscle strength 5/5 to all lower extremity muscle groups bilaterally, no pain crepitus or joint limitation noted with ROM b/l and bunion deformity noted b/l  Neurological: Protective sensation absent with 10g monofilament b/l  Assessment: 1. Pain due to onychomycosis of toenail   2. Porokeratosis   3. Diabetic polyneuropathy associated with type 2 diabetes  mellitus ([place])    Plan: -Continue diabetic foot care principles. Literature dispensed on today.  -Toenails 1-5 b/l were debrided in length and girth without iatrogenic bleeding. -Painful porokeratotic lesions submet head 1 right, plantarlateral right foot and plantar left heel pared and enucleated with sterile scalpel blade -Patient to continue soft, supportive shoe gear daily. -Patient to report any pedal injuries to medical professional immediately. -Patient/POA to call should there be question/concern in the interim.  Return in about 3 months (around 07/20/2019) for diabetic nail and callus trim.

## 2019-04-27 ENCOUNTER — Ambulatory Visit: Payer: Medicare HMO | Attending: Internal Medicine

## 2019-04-27 ENCOUNTER — Other Ambulatory Visit: Payer: Self-pay

## 2019-04-27 DIAGNOSIS — Z23 Encounter for immunization: Secondary | ICD-10-CM | POA: Insufficient documentation

## 2019-04-27 NOTE — Progress Notes (Signed)
   Covid-19 Vaccination Clinic  Name:  [name]    MRN: [id number] DOB: 09/12/1950  04/27/2019  [name] was observed post Covid-19 immunization for 15 minutes without incidence. She was provided with Vaccine Information Sheet and instruction to access the V-Safe system.   [name] was instructed to call 911 with any severe reactions post vaccine: [name] Difficulty breathing  . Swelling of your face and throat  . A fast heartbeat  . A bad rash all over your body  . Dizziness and weakness    Immunizations Administered    Name Date Dose VIS Date Route   Moderna COVID-19 Vaccine 04/27/2019  3:38 PM 0.5 mL 02/11/2019 Intramuscular   Manufacturer: Moderna   Lot: [id number]   [id number]

## 2019-05-01 DIAGNOSIS — G4733 Obstructive sleep apnea (adult) (pediatric): Secondary | ICD-10-CM | POA: Diagnosis not present

## 2019-05-01 DIAGNOSIS — E785 Hyperlipidemia, unspecified: Secondary | ICD-10-CM | POA: Diagnosis not present

## 2019-05-05 ENCOUNTER — Other Ambulatory Visit: Payer: Self-pay

## 2019-05-05 ENCOUNTER — Ambulatory Visit (INDEPENDENT_AMBULATORY_CARE_PROVIDER_SITE_OTHER): Payer: Medicare HMO | Admitting: Psychiatry

## 2019-05-05 ENCOUNTER — Encounter (HOSPITAL_COMMUNITY): Payer: Self-pay | Admitting: Psychiatry

## 2019-05-05 DIAGNOSIS — F331 Major depressive disorder, recurrent, moderate: Secondary | ICD-10-CM | POA: Diagnosis not present
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
## 2019-05-12 DIAGNOSIS — E1142 Type 2 diabetes mellitus with diabetic polyneuropathy: Secondary | ICD-10-CM | POA: Diagnosis not present

## 2019-05-12 DIAGNOSIS — J42 Unspecified chronic bronchitis: Secondary | ICD-10-CM | POA: Diagnosis not present

## 2019-05-12 DIAGNOSIS — M160 Bilateral primary osteoarthritis of hip: Secondary | ICD-10-CM | POA: Diagnosis not present

## 2019-05-12 DIAGNOSIS — D649 Anemia, unspecified: Secondary | ICD-10-CM | POA: Diagnosis not present

## 2019-05-12 DIAGNOSIS — F17210 Nicotine dependence, cigarettes, uncomplicated: Secondary | ICD-10-CM | POA: Diagnosis not present

## 2019-05-12 DIAGNOSIS — E1165 Type 2 diabetes mellitus with hyperglycemia: Secondary | ICD-10-CM | POA: Diagnosis not present

## 2019-05-12 DIAGNOSIS — E039 Hypothyroidism, unspecified: Secondary | ICD-10-CM | POA: Diagnosis not present

## 2019-05-12 DIAGNOSIS — J449 Chronic obstructive pulmonary disease, unspecified: Secondary | ICD-10-CM | POA: Diagnosis not present

## 2019-05-12 DIAGNOSIS — I10 Essential (primary) hypertension: Secondary | ICD-10-CM | POA: Diagnosis not present

## 2019-05-12 DIAGNOSIS — F250 Schizoaffective disorder, bipolar type: Secondary | ICD-10-CM | POA: Diagnosis not present

## 2019-05-13 ENCOUNTER — Other Ambulatory Visit: Payer: Self-pay

## 2019-05-13 DIAGNOSIS — R32 Unspecified urinary incontinence: Secondary | ICD-10-CM | POA: Diagnosis not present

## 2019-05-13 NOTE — Patient Outreach (Addendum)
[facility]) Care Management  [place]  05/13/2019   [name] [birth year] [id number]  Subjective: Telephone call to patient.  She reports that she is maintaining.  She reports having a cortisone shot to left shoulder that bothers her.  She reports that her breathing has been fair.  Discussed with patient COPD and managing daily. She verbalized understanding.  Patient had first COVID vaccine and doing ok.  Patient mentioned needing help with executing advanced directive.  Discussed social work referral for assistance.  She is agreeable and denies any other concerns.     Objective:   Encounter Medications:  Outpatient Encounter Medications as of 05/13/2019  Medication Sig  . ACCU-CHEK AVIVA PLUS test strip CHECK BLOOD SUGAR FOUR TIMES DAILY  . albuterol (VENTOLIN HFA) 108 (90 Base) MCG/ACT inhaler Inhale 2 puffs into the lungs every 6 (six) hours as needed for wheezing or shortness of breath.  [name] alendronate (FOSAMAX) 70 MG tablet Take 1 tablet (70 mg total) by mouth every 7 (seven) days. Take with a full glass of water on an empty stomach.  [name] amLODipine (NORVASC) 10 MG tablet TAKE 1 TABLET EVERY DAY  . ascorbic acid (VITAMIN C) 500 MG tablet Take 500 mg by mouth daily.  [name] aspirin EC 81 MG tablet Take 1 tablet (81 mg total) by mouth daily with breakfast.  . cetirizine (ZYRTEC) 10 MG tablet Take 1 tablet (10 mg total) by mouth daily.  . clobetasol cream (TEMOVATE) [id number] % Apply 1 application topically 2 (two) times daily.   . cromolyn (NASALCROM) 5.2 MG/ACT nasal spray Place 1 spray into both nostrils 4 (four) times daily.  . diclofenac sodium (VOLTAREN) 1 % GEL APPLY 2 GRAMS  TOPICALLY 4 (FOUR) TIMES DAILY.  [name] diclofenac Sodium (VOLTAREN) 1 % GEL APPLY 2 GRAMS  TOPICALLY 4 (FOUR) TIMES DAILY.  [name] ezetimibe (ZETIA) 10 MG tablet Take 1 tablet (10 mg total) by mouth daily.  . fluticasone (FLONASE) 50 MCG/ACT nasal spray Place 2 sprays into both nostrils daily.  .  hydroxychloroquine (PLAQUENIL) 200 MG tablet   . insulin aspart (NOVOLOG FLEXPEN) 100 UNIT/ML FlexPen INJECT 10 TO 14 UNITS INTO THE SKIN 3 (THREE) TIMES DAILY WITH MEALS.  [name] Insulin Glargine, 2 Unit Dial, (TOUJEO MAX SOLOSTAR) 300 UNIT/ML SOPN Inject 25 Units into the skin at bedtime.  . lamoTRIgine (LAMICTAL) 100 MG tablet TAKE 1 TABLET TWICE DAILY  . Lancets (ACCU-CHEK SOFT TOUCH) lancets Use as instructed to check blood sugar 3 times a day.  . levothyroxine (SYNTHROID) 50 MCG tablet TAKE 1 TABLET DAILY, EXCEPT TAKE 1/2 TABLET ON SUNDAY  . linaclotide (LINZESS) 145 MCG CAPS capsule Take 1 capsule (145 mcg total) by mouth daily before breakfast.  . LORazepam (ATIVAN) 0.5 MG tablet Take 1 tablet (0.5 mg total) by mouth 3 (three) times daily.  [name] losartan (COZAAR) 50 MG tablet TAKE 1 TABLET EVERY DAY  . lubiprostone (AMITIZA) 24 MCG capsule Take 1 capsule (24 mcg total) by mouth 2 (two) times daily with a meal.  . metaxalone (SKELAXIN) 800 MG tablet metaxalone 800 mg tablet  take 1 tablet by mouth 3 times daily as needed.  . metoprolol tartrate (LOPRESSOR) 50 MG tablet TAKE 1 TABLET TWICE DAILY (NEED MD APPOINTMENT)  . montelukast (SINGULAIR) 10 MG tablet TAKE 1 TABLET EVERY DAY  . nystatin (MYCOSTATIN/NYSTOP) powder APPLY TO AFFECTED AREA 4 TIMES DAILY.  [name] Omega-3 Fatty Acids (FISH OIL PO) Take 1 capsule by mouth daily.  . ondansetron (  ZOFRAN) 4 MG tablet Take 1 tablet (4 mg total) by mouth every 8 (eight) hours as needed for nausea or vomiting.  [name] oxyCODONE-acetaminophen (PERCOCET) 10-325 MG tablet Take 1 tablet by mouth every 8 (eight) hours as needed.   . pregabalin (LYRICA) 75 MG capsule Take 1 capsule (75 mg total) by mouth daily.  . RABEprazole (ACIPHEX) 20 MG tablet TAKE 1 TABLET TWICE DAILY  . RESTASIS 0.05 % ophthalmic emulsion Place 1 drop into both eyes 2 (two) times daily.   . risperiDONE (RISPERDAL) 0.5 MG tablet Take 1 tablet (0.5 mg total) by mouth at bedtime.  . rosuvastatin  (CRESTOR) 5 MG tablet TAKE 1 TABLET AT BEDTIME  . sertraline (ZOLOFT) 100 MG tablet Take 1.5 tablets (150 mg total) by mouth daily.  [name] STIOLTO RESPIMAT 2.5-2.5 MCG/ACT AERS INHALE 2 PUFFS INTO THE LUNGS DAILY.  . traZODone (DESYREL) 100 MG tablet Take 1 tablet (100 mg total) by mouth at bedtime.  . NEOMYCIN-POLYMYXIN-HC, OPHTH, SUSP neomycin 3.5 mg-polymyxin 10,000 unit-hydrocort 10 mg/mL eye drop,susp  . SHINGRIX injection    Facility-Administered Encounter Medications as of 05/13/2019  Medication  . 0.9 %  sodium chloride infusion    Functional Status:  In your present state of health, do you have any difficulty performing the following activities: 01/15/2019 12/24/2018  Hearing? N N  Vision? N N  Difficulty concentrating or making decisions? N N  Walking or climbing stairs? Y Y  Comment some weakness weakness and fatigue  Dressing or bathing? Y N  Comment Needs assistance from aide -  Doing errands, shopping? Y N  Comment aide takes shopping -  Conservation officer, nature and eating ? Y N  Comment aide assists -  Using the Toilet? N N  In the past six months, have you accidently leaked urine? N N  Do you have problems with loss of bowel control? N N  Managing your Medications? N N  Managing your Finances? N N  Housekeeping or managing your Housekeeping? N N  Some recent data might be hidden    Fall/Depression Screening: Fall Risk  04/21/2019 02/03/2019 01/15/2019  Falls in the past year? 1 1 0  Comment - - -  Number falls in past yr: 1 1 -  Comment - - -  Injury with Fall? 1 1 -  Comment - - -  Risk Factor Category  - - -  Risk for fall due to : - - -  Follow up - - -   PHQ 2/9 Scores 05/13/2019 04/21/2019 01/15/2019 12/24/2018 12/13/2018 12/11/2018 10/10/2018  PHQ - 2 Score 2 2 2 2  0 3 2  PHQ- 9 Score 8 3 9 13  - 14 6  Some encounter information is confidential and restricted. Go to Review Flowsheets activity to see all data.    Assessment: Patient continues to manage chronic illnesses.  No recent hospitalizations.  Plan:  [facility] CM Care Plan Problem One     Most Recent Value  Care Plan Problem One  Knowledge deficit related to recent hospitalization  Role Documenting the Problem One  Care Management Telephonic [place] for Problem One  Active  [facility] Long Term Goal   Patient will not have any exacerbations of COPD within 90 days.    [facility] Long Term Goal Start Date  05/13/19  Interventions for Problem One Long Term Goal  Patient reports green zone of COPD.  Discussed with patient COPD zones and pacing self to help with breathing.  RN CM will refer to social work for help with executing advanced directive.   RN CM will contact patient in the month of May and patient agreeable.   Jone Baseman, RN, MSN [place] Management Care Management Coordinator Direct Line [phone] Cell [phone] Toll Free: [phone]  Fax: [phone]

## 2019-05-14 ENCOUNTER — Ambulatory Visit: Payer: Self-pay

## 2019-05-16 DIAGNOSIS — I10 Essential (primary) hypertension: Secondary | ICD-10-CM | POA: Diagnosis not present

## 2019-05-16 DIAGNOSIS — M179 Osteoarthritis of knee, unspecified: Secondary | ICD-10-CM | POA: Diagnosis not present

## 2019-05-16 DIAGNOSIS — Z9181 History of falling: Secondary | ICD-10-CM | POA: Diagnosis not present

## 2019-05-20 DIAGNOSIS — E1142 Type 2 diabetes mellitus with diabetic polyneuropathy: Secondary | ICD-10-CM | POA: Diagnosis not present

## 2019-05-20 DIAGNOSIS — J42 Unspecified chronic bronchitis: Secondary | ICD-10-CM | POA: Diagnosis not present

## 2019-05-20 DIAGNOSIS — F17210 Nicotine dependence, cigarettes, uncomplicated: Secondary | ICD-10-CM | POA: Diagnosis not present

## 2019-05-20 DIAGNOSIS — F250 Schizoaffective disorder, bipolar type: Secondary | ICD-10-CM | POA: Diagnosis not present

## 2019-05-20 DIAGNOSIS — E1165 Type 2 diabetes mellitus with hyperglycemia: Secondary | ICD-10-CM | POA: Diagnosis not present

## 2019-05-20 DIAGNOSIS — M160 Bilateral primary osteoarthritis of hip: Secondary | ICD-10-CM | POA: Diagnosis not present

## 2019-05-20 DIAGNOSIS — E039 Hypothyroidism, unspecified: Secondary | ICD-10-CM | POA: Diagnosis not present

## 2019-05-20 DIAGNOSIS — J449 Chronic obstructive pulmonary disease, unspecified: Secondary | ICD-10-CM | POA: Diagnosis not present

## 2019-05-20 DIAGNOSIS — D649 Anemia, unspecified: Secondary | ICD-10-CM | POA: Diagnosis not present

## 2019-05-21 ENCOUNTER — Encounter: Payer: Self-pay | Admitting: *Deleted

## 2019-05-21 ENCOUNTER — Other Ambulatory Visit: Payer: Self-pay | Admitting: *Deleted

## 2019-05-21 NOTE — Patient Outreach (Signed)
[facility]) Care Management  05/21/2019  [name] [birth year] [id number]  CSW was able to make initial contact with patient today to perform phone assessment, as well as assess and assist with social work needs and services.  CSW introduced self, explained role and types of services provided through [place] Management ([place] Management).  CSW further explained to patient that CSW works with patient's Telephonic RNCM, also with [place] [facility], [name].  CSW then explained the reason for the call, indicating that [name] thought that patient would benefit from social work services and resources to assist with completion of Advanced Directives ([place] documents).  CSW obtained two HIPAA compliant identifiers from patient, which included patient's name and date of birth.  After completing the assessment with patient, patient admitted that she was too tired to proceed with completing her Advanced Directives, requesting that CSW contact her again next week, preferably on Friday, May 30, 2019, to have CSW assist her with completing her Living Will and [place] documents.  CSW voiced understanding, agreeing to contact patient around 9:00am on the 19th.  CSW encouraged patient to request that her son, [name] be available during the call, to assist with answering any questions that patient may have regarding the documents.  Patient reported already having an Advanced Directives packet, mailed to her by [employer], social work Social worker, also with [place] [facility], in October of 2020.  [name], BSW, MSW, LCSW  Licensed Education officer, environmental Health System  Mailing [place] [address] [place], [place] [address] Physical Address-[address], [place], [place] [address] Toll Free Main # [phone] Fax # [phone] Cell #  [phone]  Office # [phone] [name]

## 2019-05-22 ENCOUNTER — Other Ambulatory Visit: Payer: Self-pay

## 2019-05-22 DIAGNOSIS — D329 Benign neoplasm of meninges, unspecified: Secondary | ICD-10-CM | POA: Diagnosis not present

## 2019-05-22 DIAGNOSIS — H1131 Conjunctival hemorrhage, right eye: Secondary | ICD-10-CM | POA: Diagnosis not present

## 2019-05-22 NOTE — Patient Outreach (Signed)
[facility]) Care Management  05/22/2019  [name] 07/01/1950 [id number]   Telephone call to patient to follow up on nurse line call for right eye redness.  Patient reports that she has an appointment with the eye doctor today to assess her eye.  She reports that it is not better after trying things at home so she made an appointment with the eye doctor.  She reports she had to reschedule her ortho appointment to check her shoulder today but was able to reschedule for a better time.  Discussed with patient importance of appointment today for assessment of her eye. She verbalized understanding and appreciate the call to check in.  She voices no concerns.   Plan: RN CM will contact patient again in May as previously planned and patient in agreement.   Jone Baseman, RN, MSN [place] Management Care Management Coordinator Direct Line [phone] Cell [phone] Toll Free: [phone]  Fax: [phone]

## 2019-05-23 ENCOUNTER — Ambulatory Visit: Payer: Self-pay | Admitting: *Deleted

## 2019-05-23 IMAGING — DX DG CHEST 1V
1 series · 1 of 1 positions shown · non-contrast
Comparison: 05/31/2017

CLINICAL DATA: Altered mental status.

EXAM:
CHEST  1 VIEW

[chest ap]
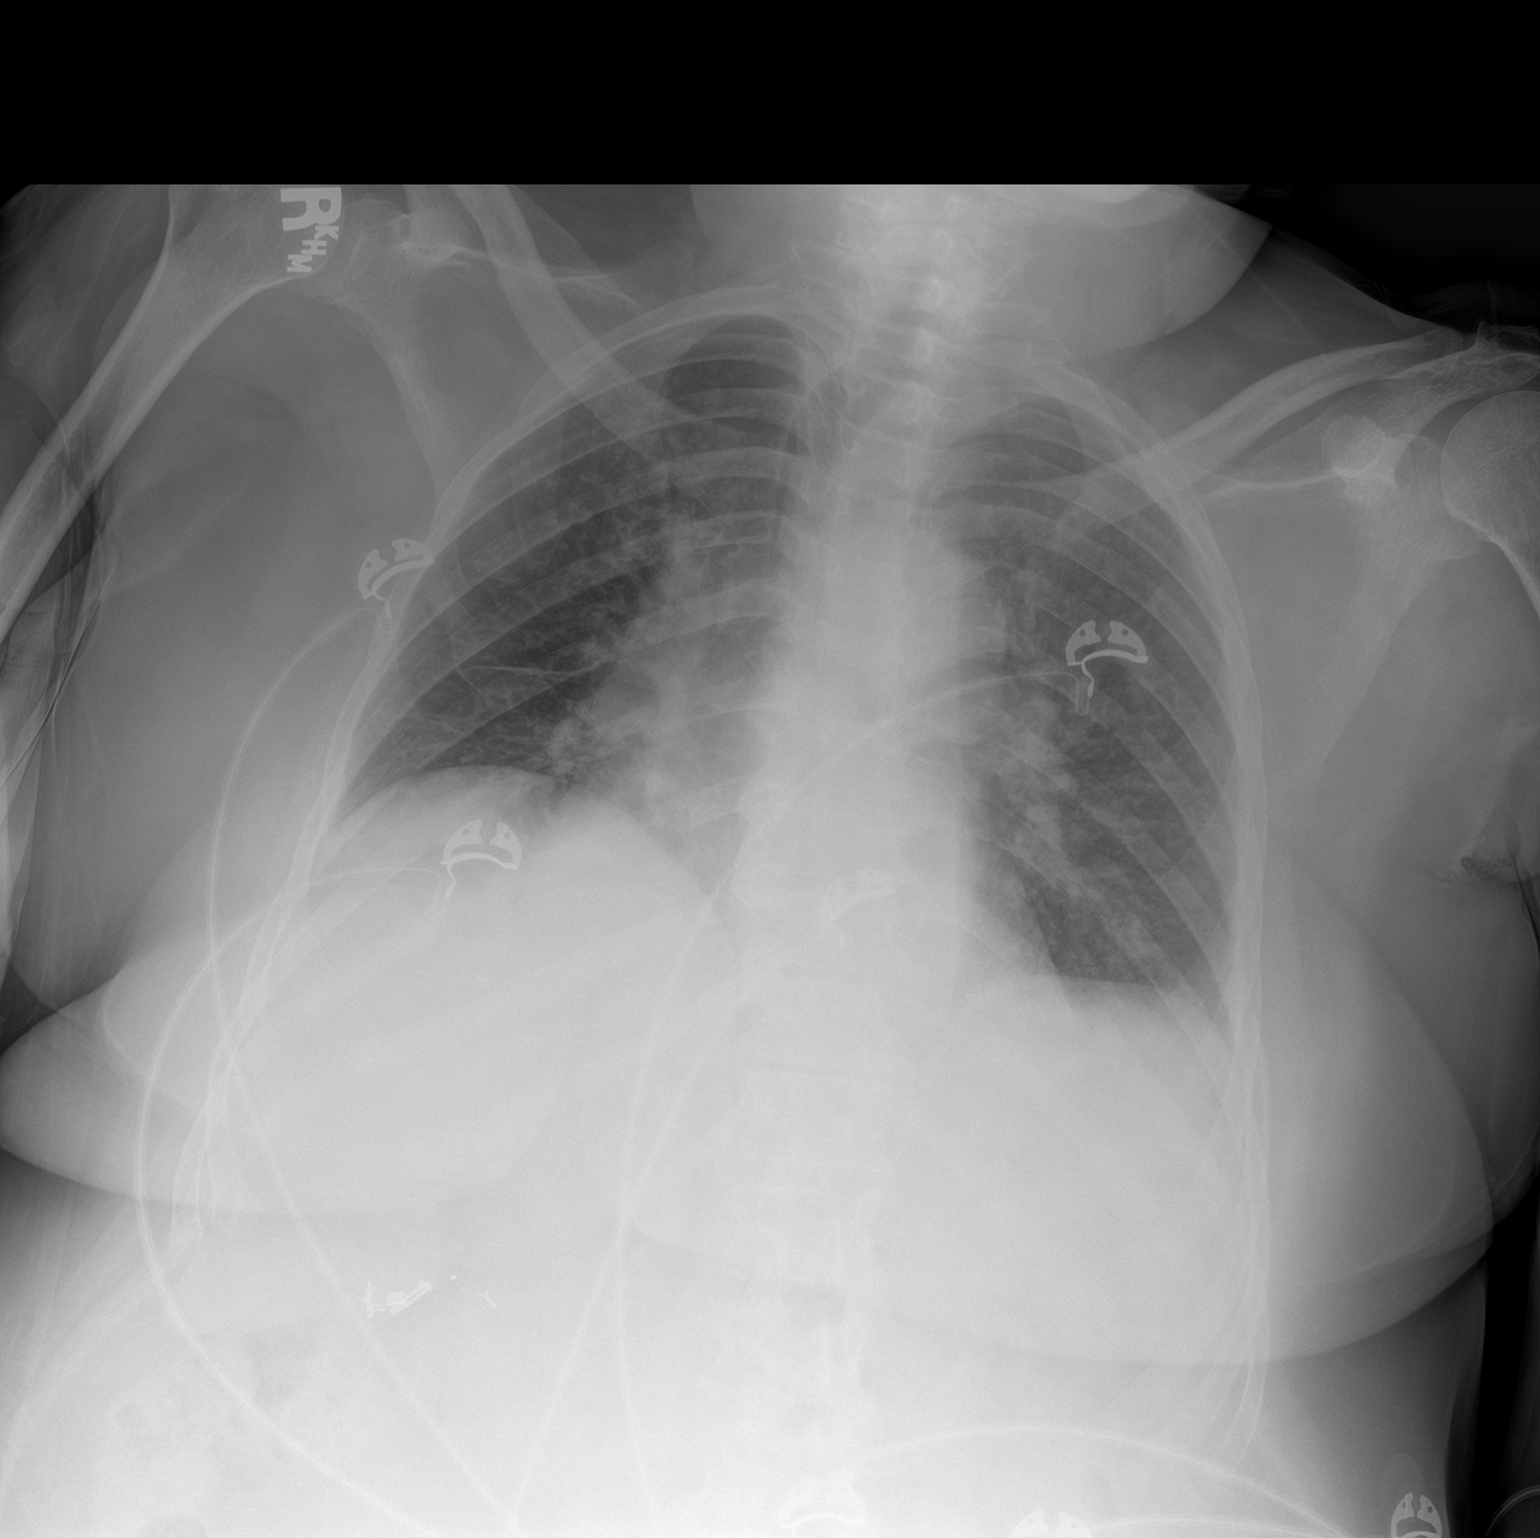

[1 of 1 positions shown; findings below may reference images not displayed]

FINDINGS: Patient slightly rotated to the right. Lungs are hypoinflated
without focal airspace consolidation or effusion. There is mild
prominence of the perihilar markings suggesting mild degree of
vascular congestion. Mild stable cardiomegaly. Remainder of the exam
is unchanged.
IMPRESSION: Suggestion of mild vascular congestion.

## 2019-05-23 IMAGING — CT CT HEAD W/O CM
3 series · 16 of 47 positions shown, 19 images · non-contrast
Comparison: MR brain dated October 25, 2017. CT head dated May 19, 2017.

CLINICAL DATA: Fall.

EXAM:
CT HEAD WITHOUT CONTRAST
TECHNIQUE: Contiguous axial images were obtained from the base of the skull
through the vertex without intravenous contrast.

[Series 2: head trauma wo · axial · 0.46mm/px · z∈[+2,+132]mm · 10 of 32 slices shown, 13 images]
[im 3/32  brain]
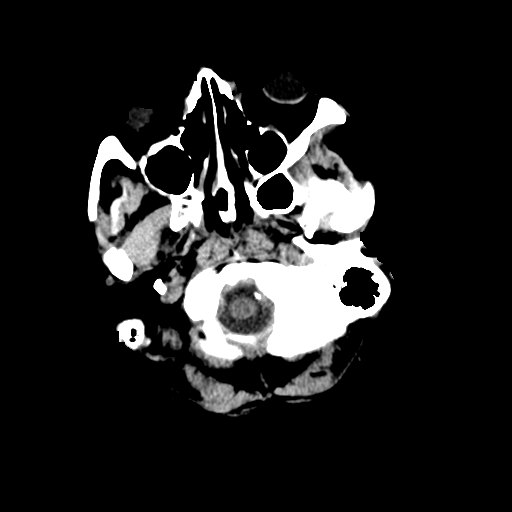
[im 3/32  bone]
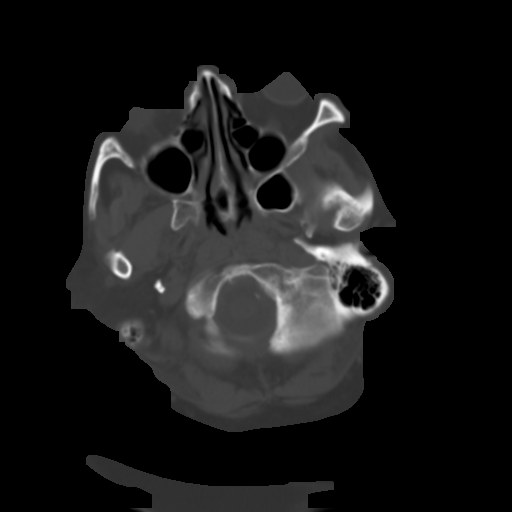
[im 6/32  brain]
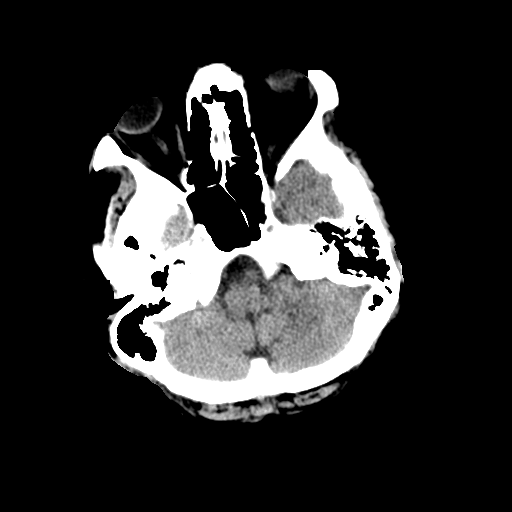
[im 9/32  brain]
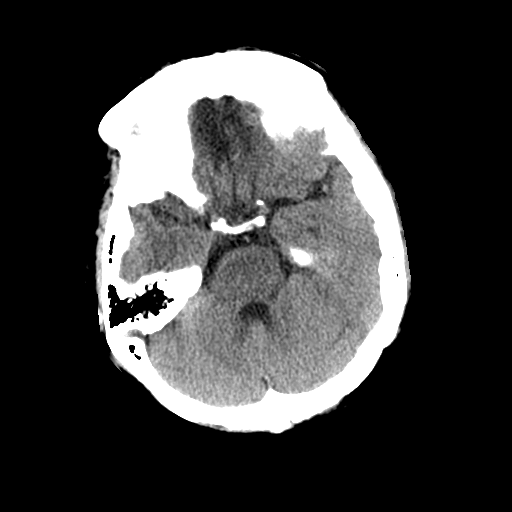
[im 11/32  brain]
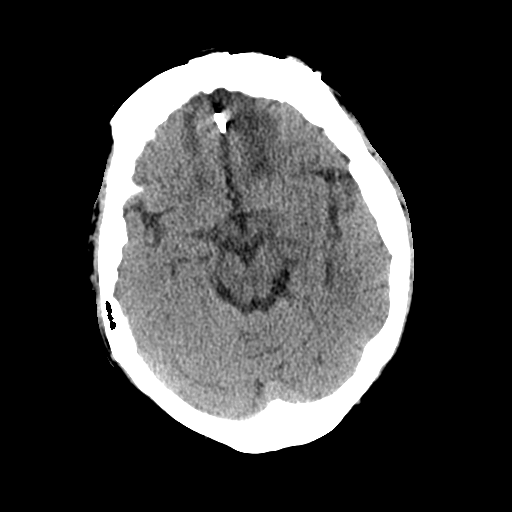
[im 14/32  brain]
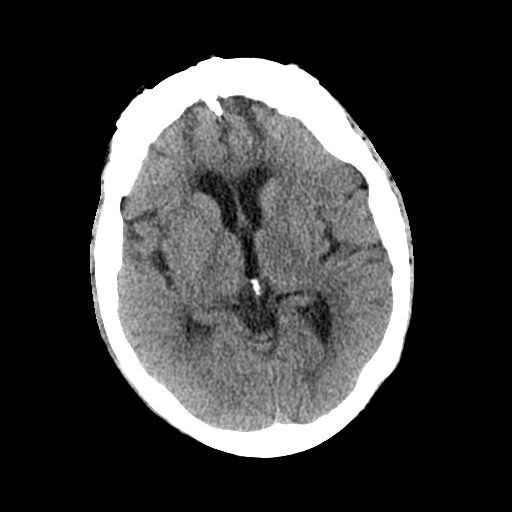
[im 14/32  bone]
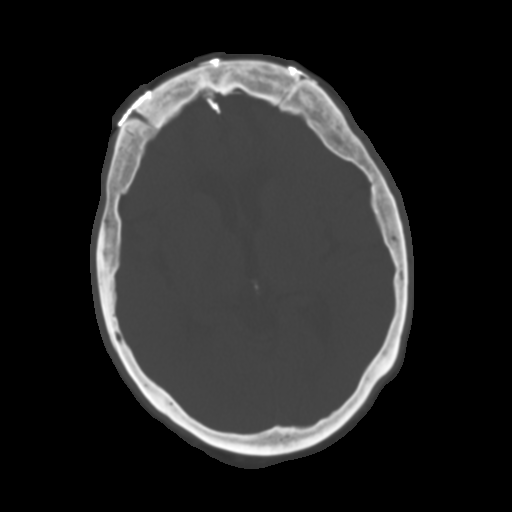
[im 18/32  brain]
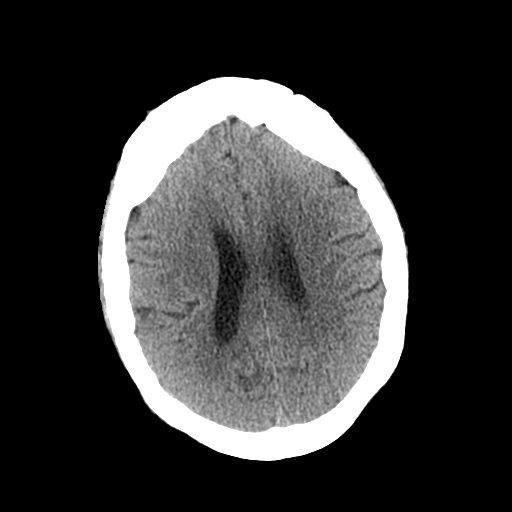
[im 21/32  brain]
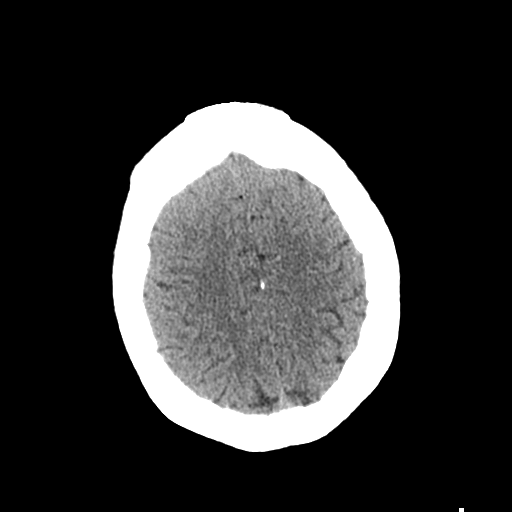
[im 24/32  brain]
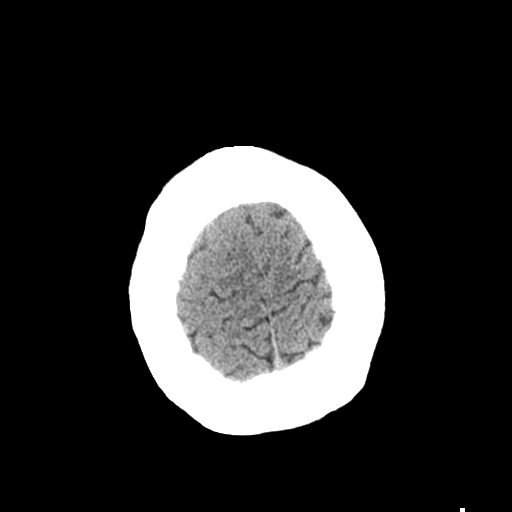
[im 26/32  brain]
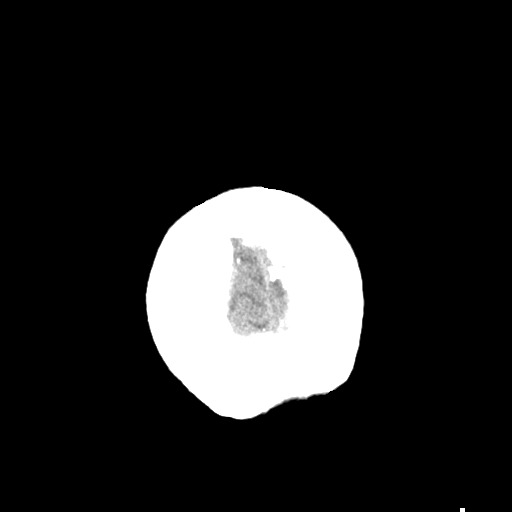
[im 26/32  bone]
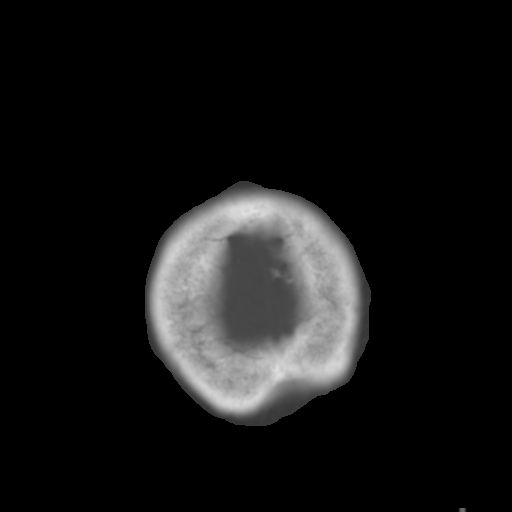
[im 29/32  brain]
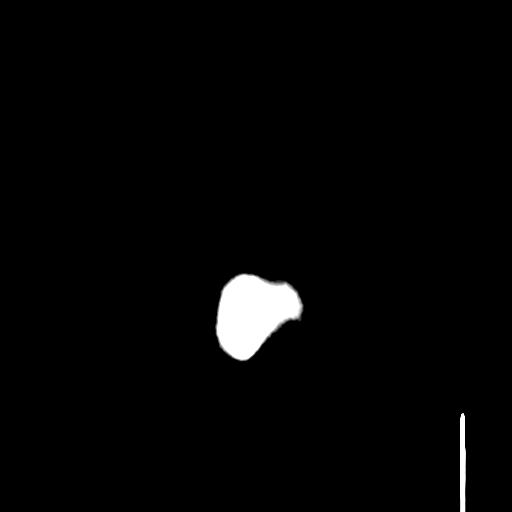

[Series 4: coronal soft tissue · coronal · 0.33mm/px · 3 of 73 slices shown]
[im 25/73  brain]
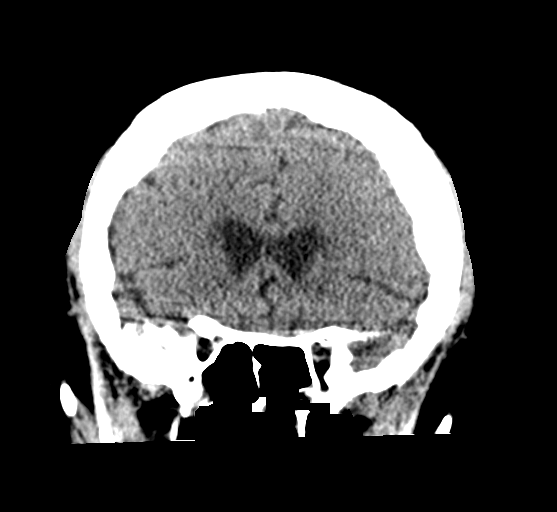
[im 33/73  brain]
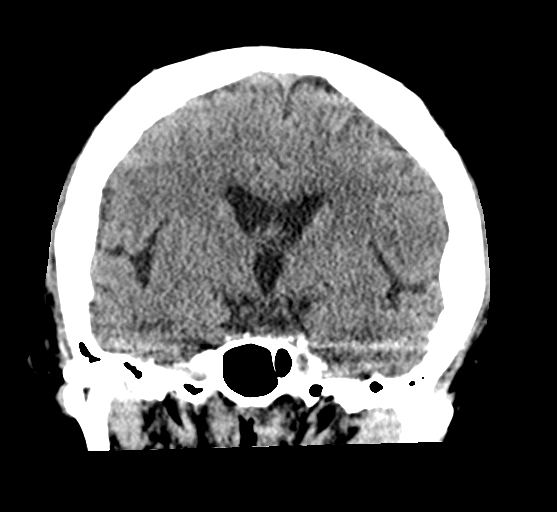
[im 41/73  brain]
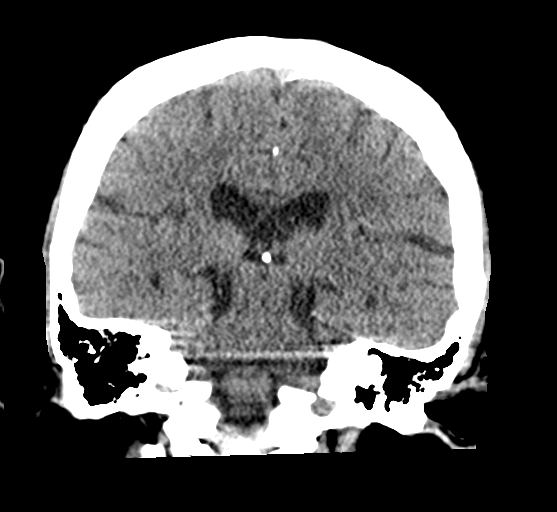

[Series 5: sagittal soft tissue · sagittal · 0.33mm/px · 3 of 66 slices shown]
[im 22/66  brain]
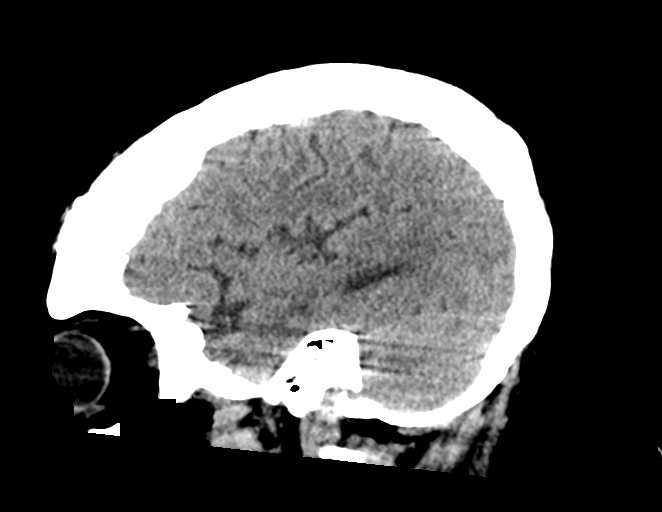
[im 33/66  brain]
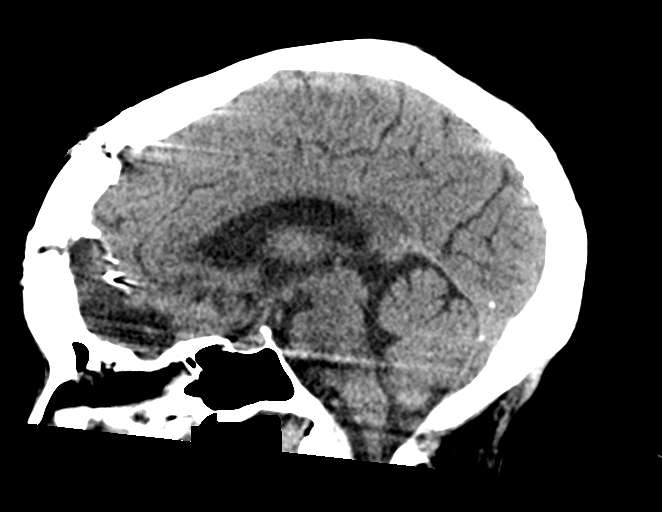
[im 44/66  brain]
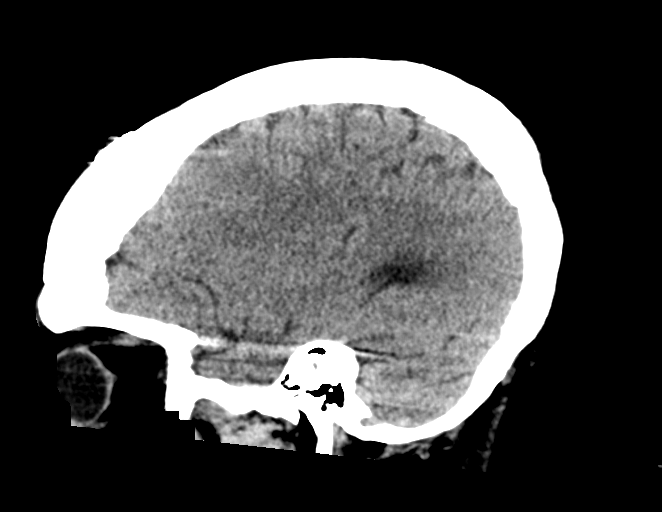

[16 of 47 positions shown; findings below may reference images not displayed]

FINDINGS: Brain: No evidence of acute infarction, hemorrhage, hydrocephalus,
extra-axial collection or mass effect. Unchanged anterior bifrontal
encephalomalacia. Known planum sphenoidale meningioma is better
evaluated on recent MRI.

Vascular: Calcified atherosclerosis at the skullbase. No hyperdense
vessel.

Skull: Prior bifrontal craniotomy. Negative for fracture or focal
lesion.

Sinuses/Orbits: No acute finding.

Other: None.
IMPRESSION: 1.  No acute intracranial abnormality.
2. Unchanged bifrontal encephalomalacia related to prior meningioma
resection. Known recurrent planum sphenoidale meningioma is better
evaluated on recent MRI.

## 2019-05-24 IMAGING — MR MR THORACIC SPINE W/O CM
4 of 6 series · 15 of 48 positions shown · non-contrast
Comparison: Chest x-rays dated 10/28/2017, 05/19/2017 and chest CT
dated 12/16/2012

CLINICAL DATA: Chronic mid back pain.  New leg weakness.

EXAM:
MRI THORACIC SPINE WITHOUT CONTRAST
TECHNIQUE: Multiplanar, multisequence MR imaging of the thoracic spine was
performed. No intravenous contrast was administered.

[Series 7: T1 · sagittal · 4.0mm · 0.86mm/px · 3 of 13 slices shown]
[im 1/13]
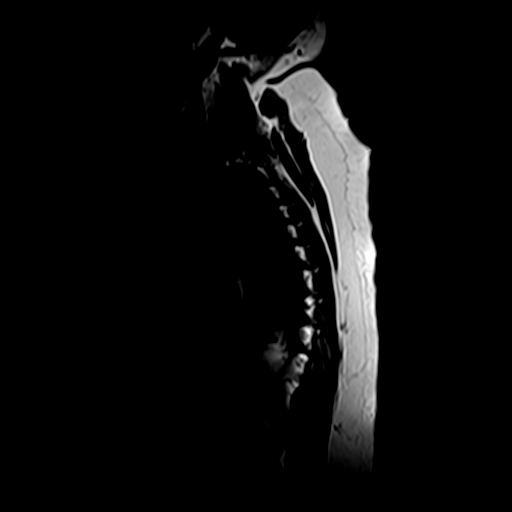
[im 9/13]
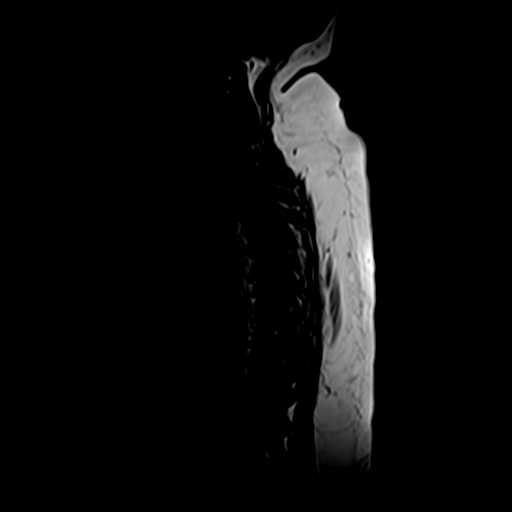
[im 13/13]
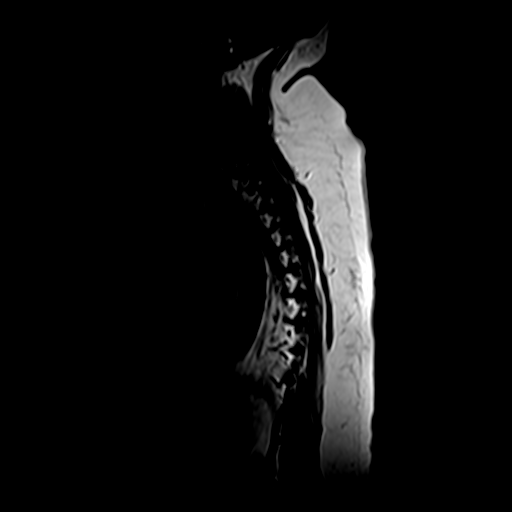

[Series 8: T2 · sagittal · 4.0mm · 0.55mm/px · 5 of 13 slices shown (1 of 3)]
[im 1/13]
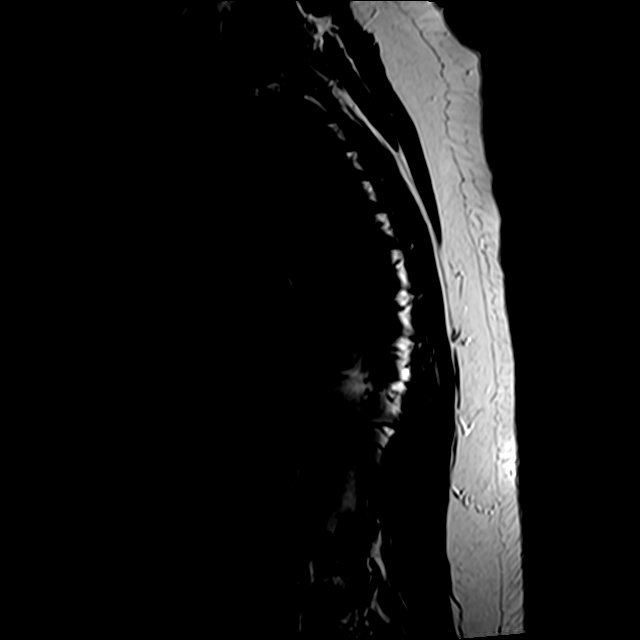
[im 4/13]
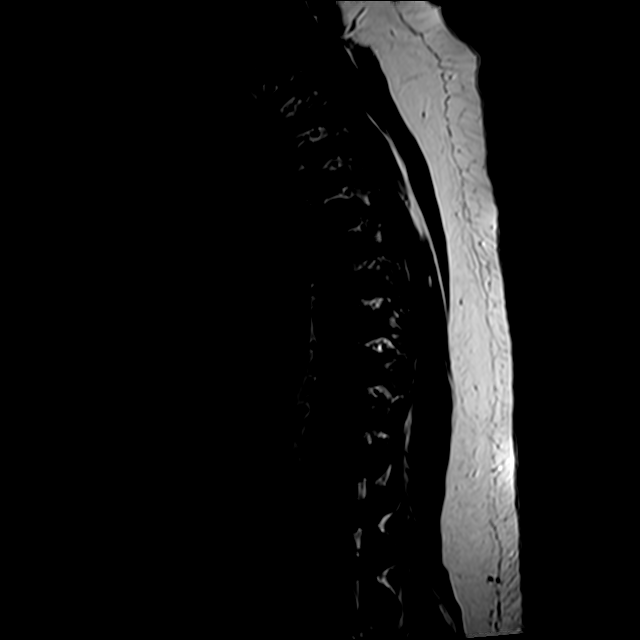
[im 7/13]
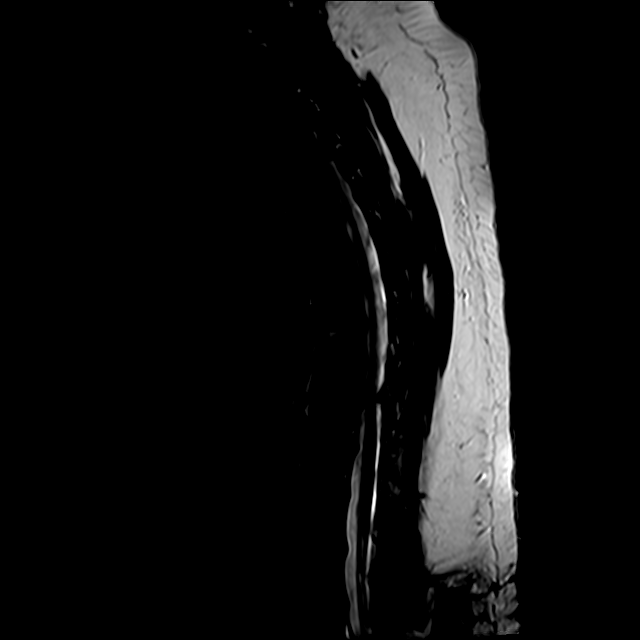
[im 10/13]
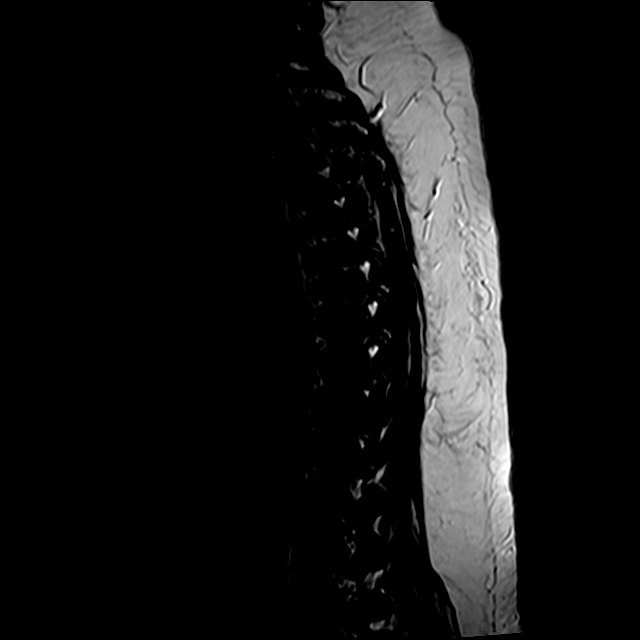
[im 13/13]
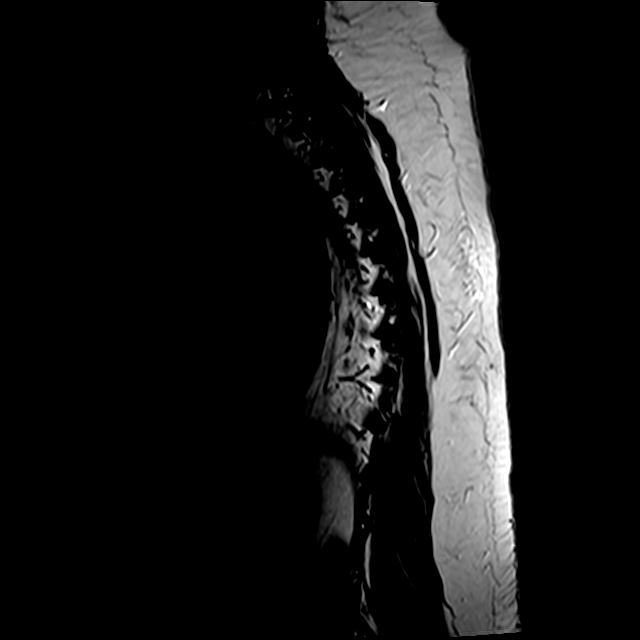

[Series 10: T2 · sagittal · 4.0mm · 0.55mm/px · 4 of 16 slices shown (2 of 3)]
[im 1/16]
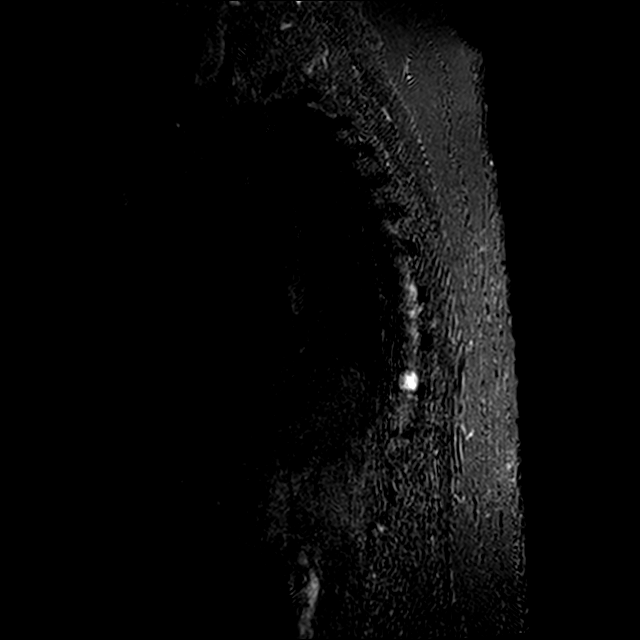
[im 4/16]
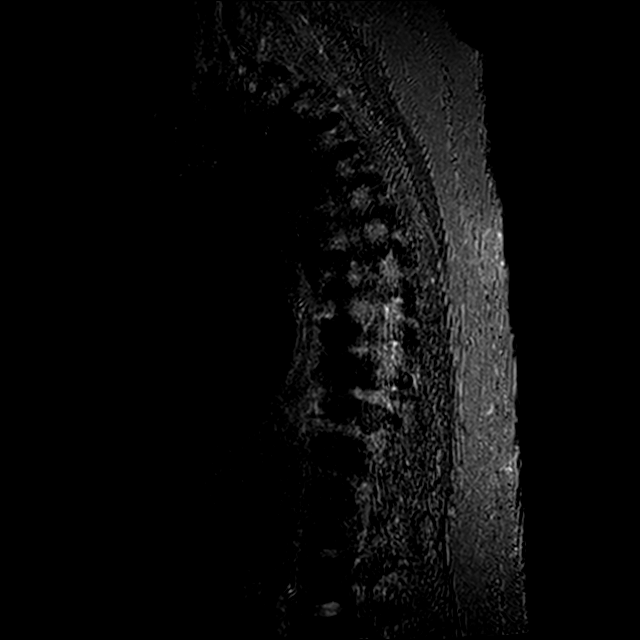
[im 10/16]
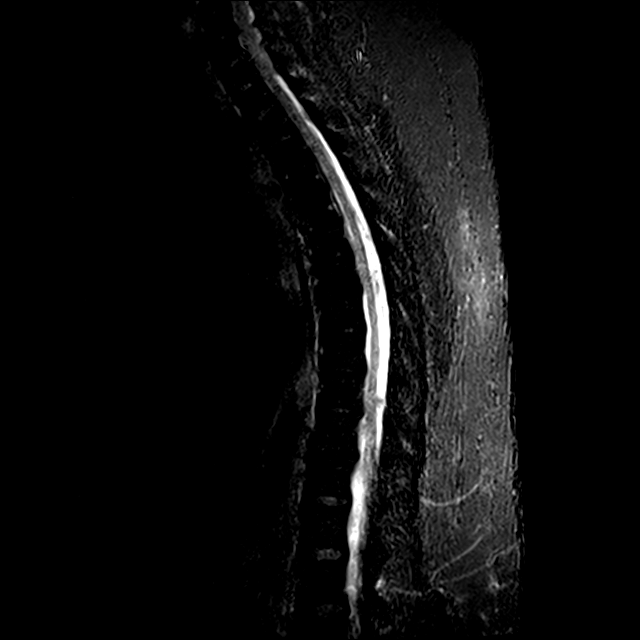
[im 16/16]
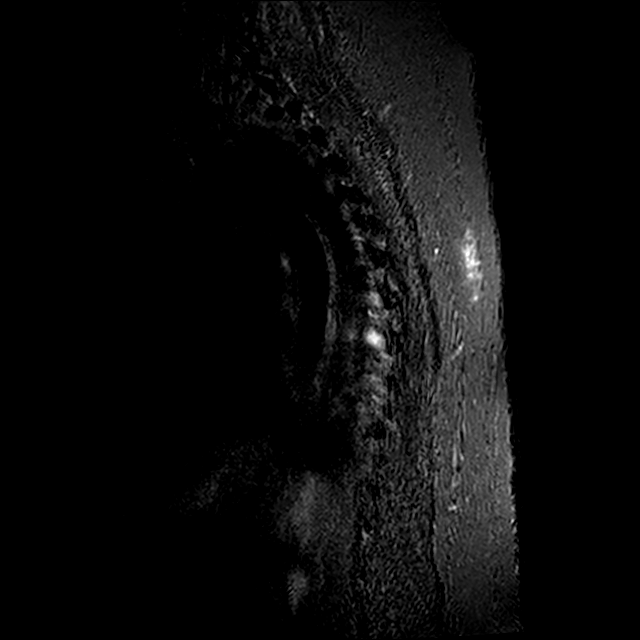

[Series 12: T2 · axial · 3.0mm · 0.23mm/px · z∈[-285,-131]mm · 3 of 39 slices shown (3 of 3)]
[im 6/39]
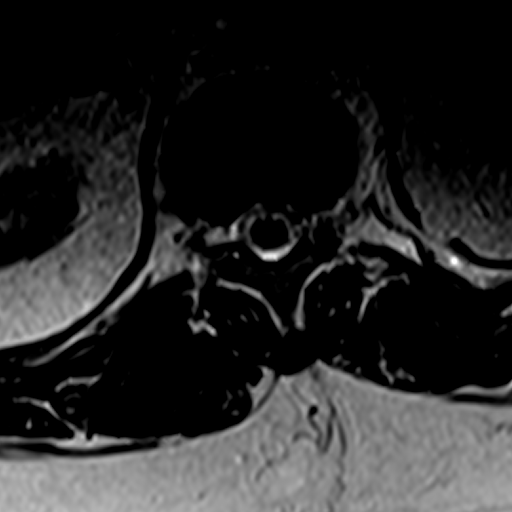
[im 21/39]
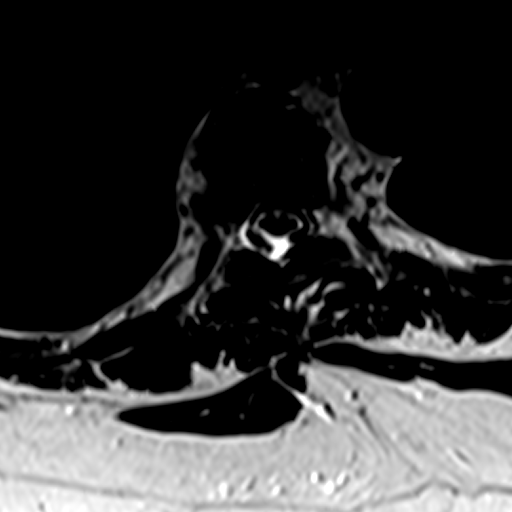
[im 33/39]
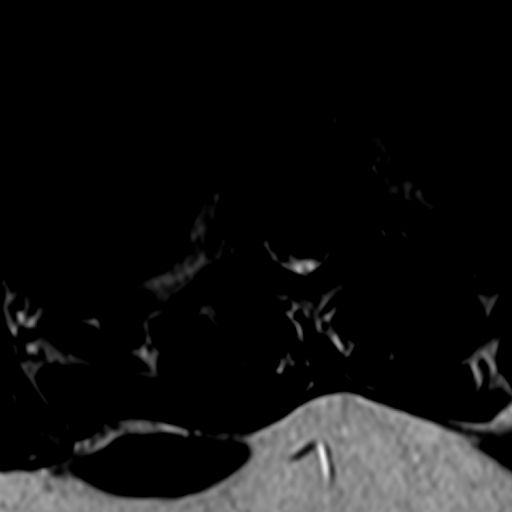

[15 of 48 positions shown; findings below may reference images not displayed]

FINDINGS: Alignment:  Physiologic.

Vertebrae: No fracture, evidence of discitis, or bone lesion.

Cord:  Normal signal and morphology.  Tip of the conus is at L1-2.

Paraspinal and other soft tissues: Subtle soft tissue edema anterior
to the right tenth rib in seen on image 1 of series 10. The rib
appears normal. This is nonspecific.

Disc levels:

The discs throughout the thoracic spine are desiccated. There are
tiny disc bulges at each level in the thoracic spine with no disc
protrusions or focal neural impingement. Slight hypertrophy of the
right ligamentum flavum at T10-11 without neural impingement.

No appreciable foraminal or spinal stenosis. No significant facet
arthritis.
IMPRESSION: 1. No appreciable abnormality of the thoracic spinal cord.
2. Diffuse degenerative disc disease in the thoracic spine without
disc protrusion or neural impingement.
3. No fractures or bone destruction.

## 2019-05-24 IMAGING — MR MR LUMBAR SPINE W/O CM
4 of 5 series · 12 of 48 positions shown · non-contrast
Comparison: CT abdomen and pelvis 06/28/2017. MRI lumbar spine
07/25/2010.

CLINICAL DATA: Onset of bilateral lower extremity weakness
10/28/2017. The patient suffered a fall due to onset of weakness.
History of prior lumbar surgery.

EXAM:
MRI LUMBAR SPINE WITHOUT CONTRAST
TECHNIQUE: Multiplanar, multisequence MR imaging of the lumbar spine was
performed. No intravenous contrast was administered.

[Series 1: T2 · sagittal · 4.0mm · 0.39mm/px · 3 of 13 slices shown (1 of 3)]
[im 3/13]
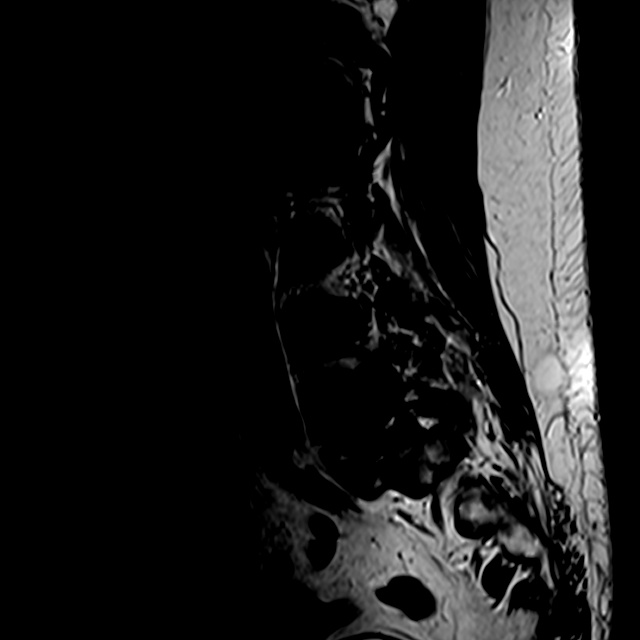
[im 8/13]
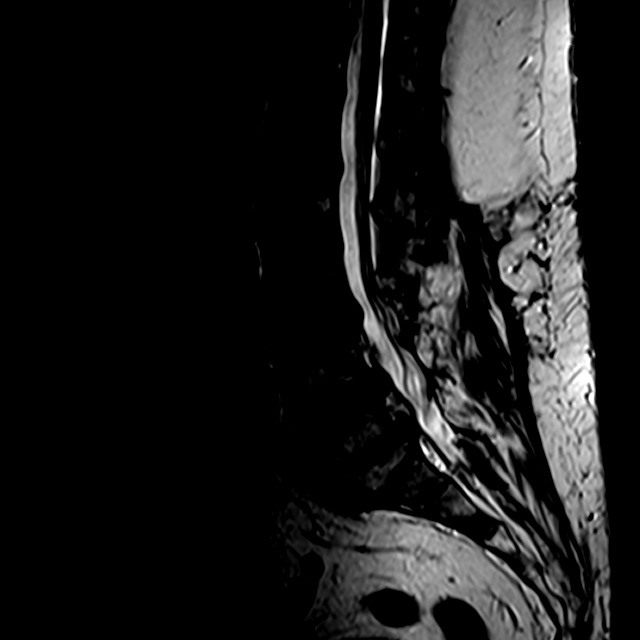
[im 13/13]
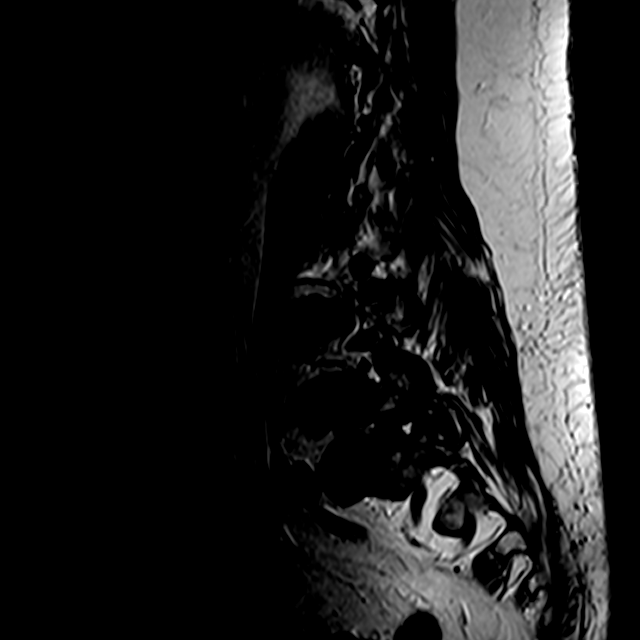

[Series 2: T1 · sagittal · 4.0mm · 0.39mm/px · 3 of 13 slices shown]
[im 3/13]
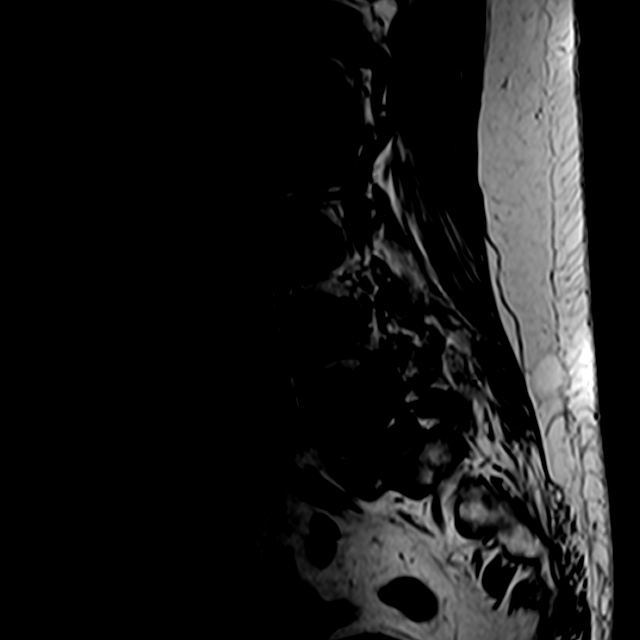
[im 8/13]
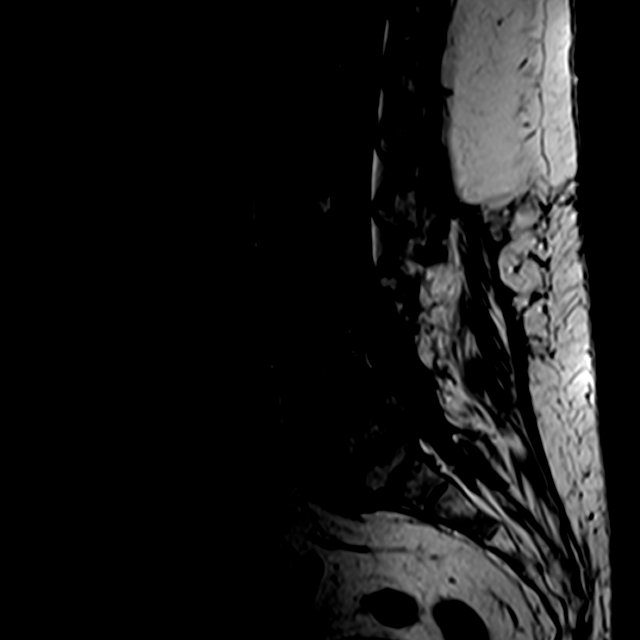
[im 13/13]
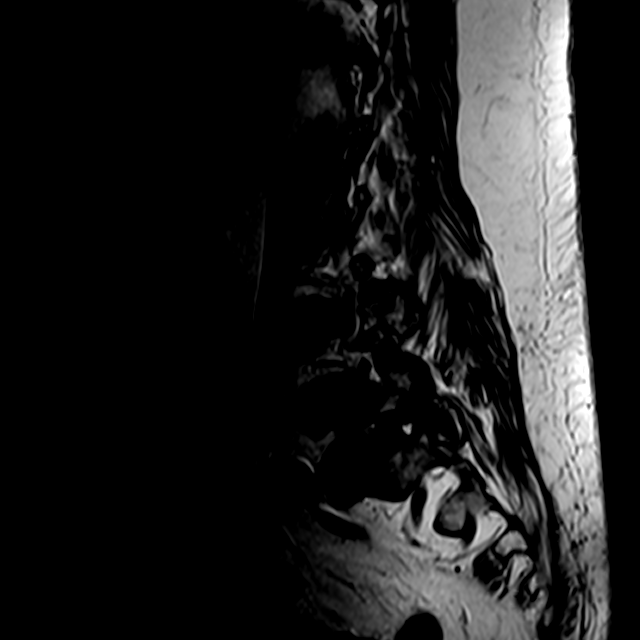

[Series 3: T2 · sagittal · 4.0mm · 0.39mm/px · 3 of 18 slices shown (2 of 3)]
[im 3/18]
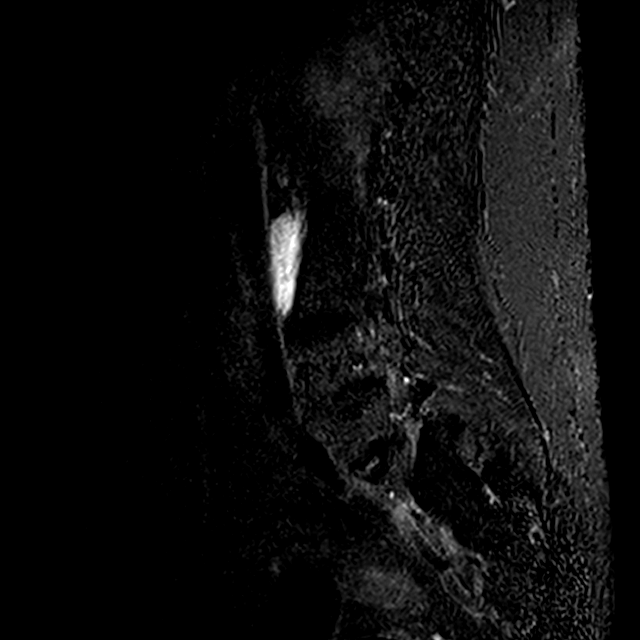
[im 10/18]
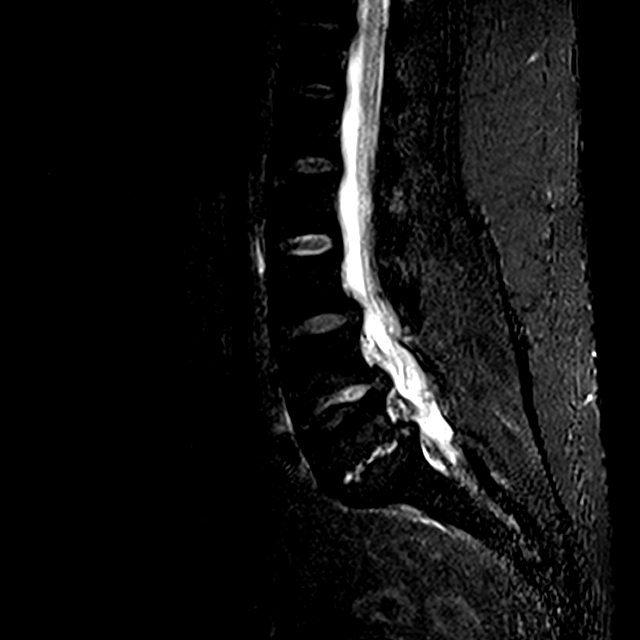
[im 15/18]
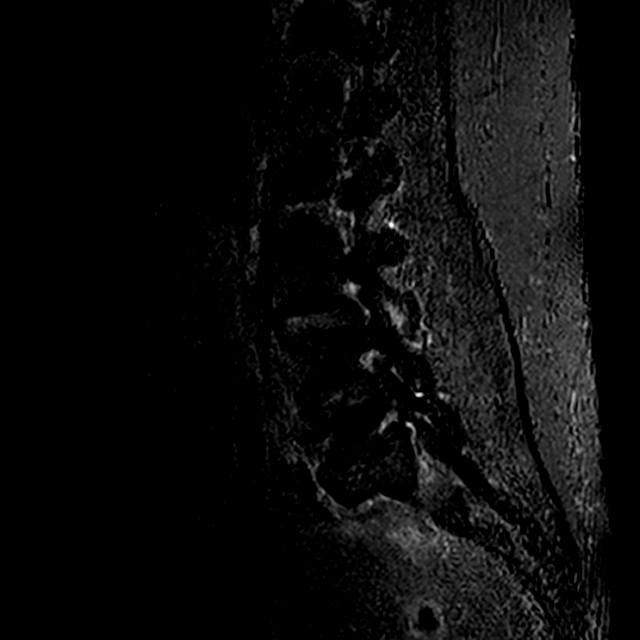

[Series 4: T2 · axial · 4.0mm · 0.23mm/px · z∈[-476,-363]mm · 3 of 30 slices shown (3 of 3)]
[im 5/30]
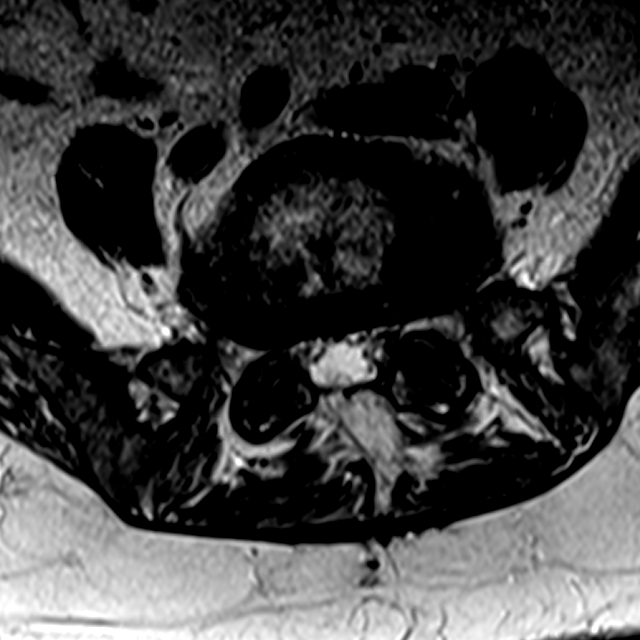
[im 16/30]
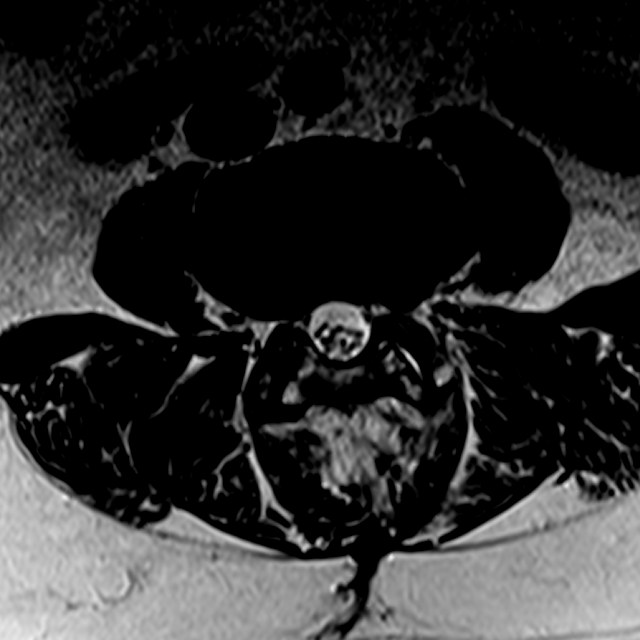
[im 25/30]
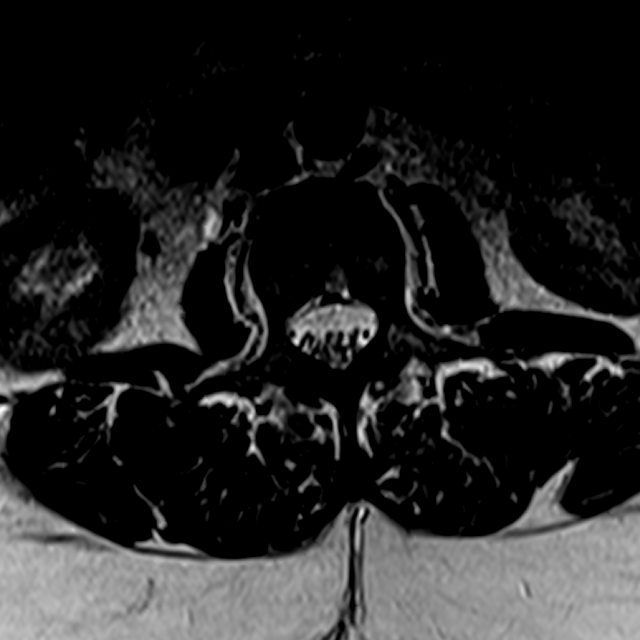

[12 of 48 positions shown; findings below may reference images not displayed]

FINDINGS: Segmentation:  Standard.

Alignment: 0.4 cm facet mediated anterolisthesis L5 on S1 noted.
There is also trace anterolisthesis L3 on L4. Otherwise maintained.

Vertebrae: No fracture or worrisome lesion. Degenerative endplate
signal change L4-5 and L5-S1 is worst at L5-S1. A few small
hemangiomas are noted.

Conus medullaris and cauda equina: Conus extends to the L1 level.
Conus and cauda equina appear normal.

Paraspinal and other soft tissues: A small focus of T2
hyperintensity posterior to the inferior vena cava the L2-3 level is
likely a benign retroperitoneal cyst and unchanged since the prior
exams.

Disc levels:

L1-2: Mild facet arthropathy and a minimal bulge without stenosis.

L2-3: Minimal disc bulge and mild facet degenerative disease without
stenosis.

L3-4: The patient has undergone posterior decompression since the
prior MRI. There is a broad-based disc bulge but the central canal
is open. Mild to moderate foraminal narrowing is more notable on the
left and unchanged.

L4-5: Status post posterior decompression since the prior MRI. The
central canal is widely patent. Disc and facet degenerative change
cause severe right foraminal narrowing which is worse than on the
prior MRI. Mild to moderate left foraminal narrowing also appears
worse than on the prior MRI.

L5-S1: Status post posterior decompression. The central canal is
open. Disc and anterolisthesis result in moderately severe to severe
bilateral foraminal narrowing, worse on the left. Foraminal
narrowing is unchanged in appearance.
IMPRESSION: Severe right foraminal narrowing at L4-5 due to disc and facet
arthropathy has worsened since the prior MRI. Mild to moderate left
foraminal narrowing also appears worse. The patient is status post
decompression at this level and the central canal is open.

No change in moderately severe to severe bilateral foraminal
narrowing at L5-S1, worse on the left. The patient has undergone
decompression at this level since the prior MRI and the central
canal is open.

Status post decompression at L3-4. The central canal is open. Mild
to moderate bilateral foraminal narrowing at this level is unchanged
in appearance.

## 2019-05-25 ENCOUNTER — Ambulatory Visit: Payer: Medicare HMO | Attending: Internal Medicine

## 2019-05-25 DIAGNOSIS — Z23 Encounter for immunization: Secondary | ICD-10-CM

## 2019-05-25 NOTE — Progress Notes (Signed)
   Covid-19 Vaccination Clinic  Name:  [name]    MRN: [id number] DOB: 1950/05/09  05/25/2019  [name] was observed post Covid-19 immunization for 15 minutes without incident. She was provided with Vaccine Information Sheet and instruction to access the V-Safe system.   [name] was instructed to call 911 with any severe reactions post vaccine: [name] Difficulty breathing  . Swelling of face and throat  . A fast heartbeat  . A bad rash all over body  . Dizziness and weakness   Immunizations Administered    Name Date Dose VIS Date Route   Moderna COVID-19 Vaccine 05/25/2019  1:22 PM 0.5 mL 02/11/2019 Intramuscular   Manufacturer: Moderna   Lot: [id number]   [id number]

## 2019-05-26 DIAGNOSIS — M160 Bilateral primary osteoarthritis of hip: Secondary | ICD-10-CM | POA: Diagnosis not present

## 2019-05-26 DIAGNOSIS — E1165 Type 2 diabetes mellitus with hyperglycemia: Secondary | ICD-10-CM | POA: Diagnosis not present

## 2019-05-26 DIAGNOSIS — F250 Schizoaffective disorder, bipolar type: Secondary | ICD-10-CM | POA: Diagnosis not present

## 2019-05-26 DIAGNOSIS — J449 Chronic obstructive pulmonary disease, unspecified: Secondary | ICD-10-CM | POA: Diagnosis not present

## 2019-05-26 DIAGNOSIS — E039 Hypothyroidism, unspecified: Secondary | ICD-10-CM | POA: Diagnosis not present

## 2019-05-26 DIAGNOSIS — F17210 Nicotine dependence, cigarettes, uncomplicated: Secondary | ICD-10-CM | POA: Diagnosis not present

## 2019-05-26 DIAGNOSIS — D649 Anemia, unspecified: Secondary | ICD-10-CM | POA: Diagnosis not present

## 2019-05-26 DIAGNOSIS — E1142 Type 2 diabetes mellitus with diabetic polyneuropathy: Secondary | ICD-10-CM | POA: Diagnosis not present

## 2019-05-26 DIAGNOSIS — J42 Unspecified chronic bronchitis: Secondary | ICD-10-CM | POA: Diagnosis not present

## 2019-05-26 IMAGING — DX DG RIBS W/ CHEST 3+V*L*
3 series · 3 of 3 positions shown · non-contrast
Comparison: Chest x-ray of October 28, 2017

CLINICAL DATA: Left anterior rib pain status post fall this morning
striking the left side. The films were performed with the patient
supine on a stretcher.

EXAM:
LEFT RIBS AND CHEST - 3+ VIEW

[rib pa obl]
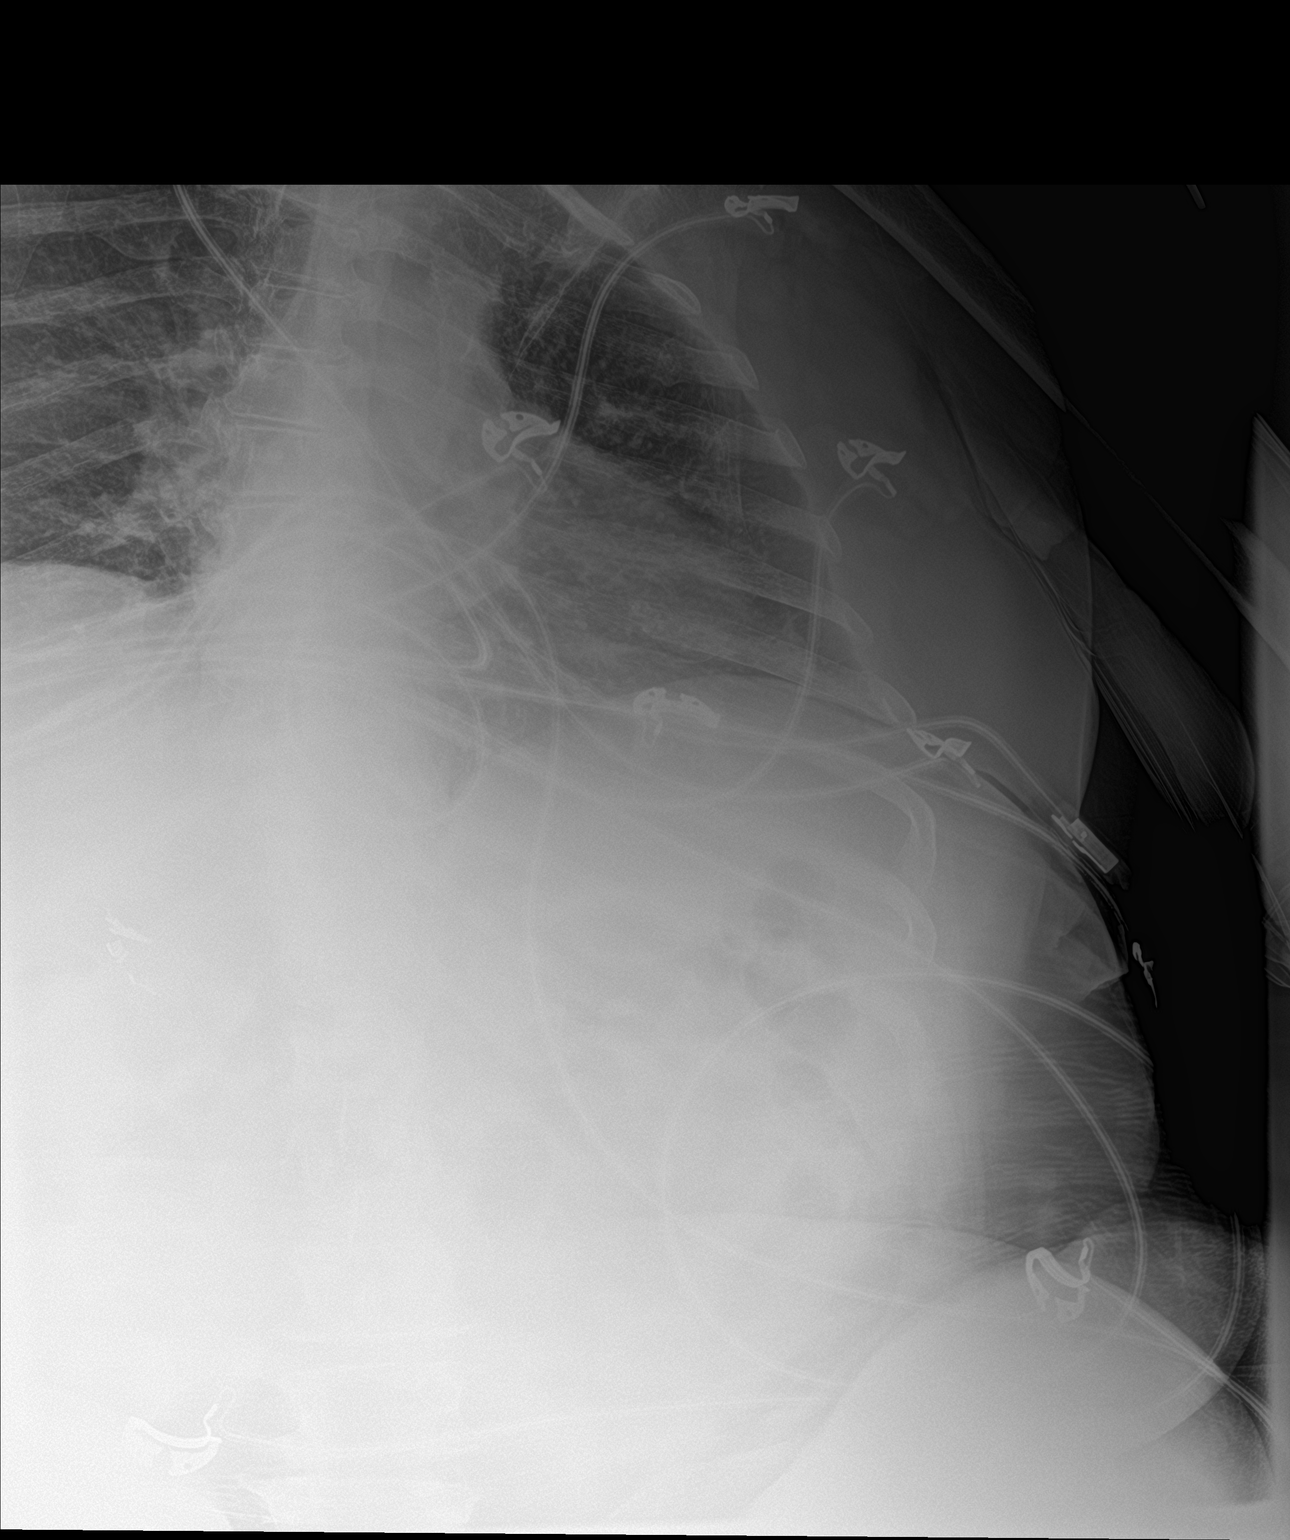

[rib pa]
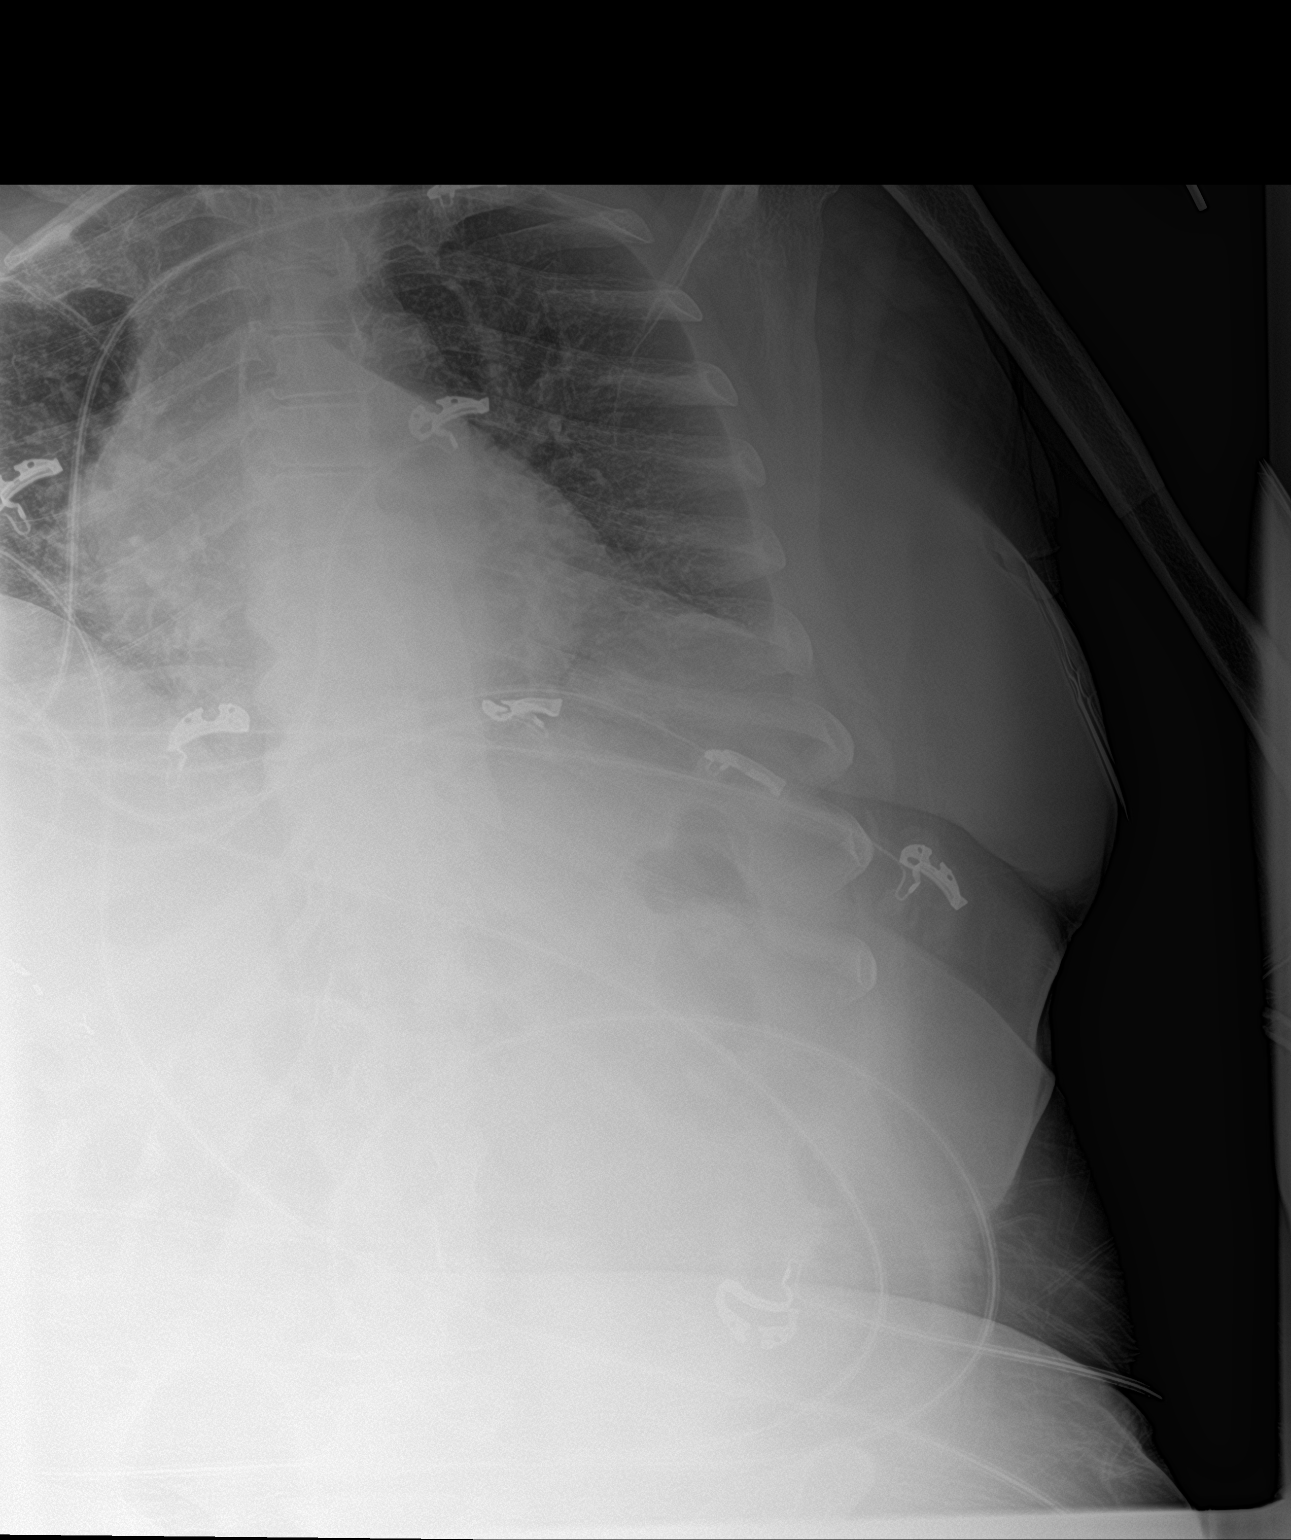

[chest ap]
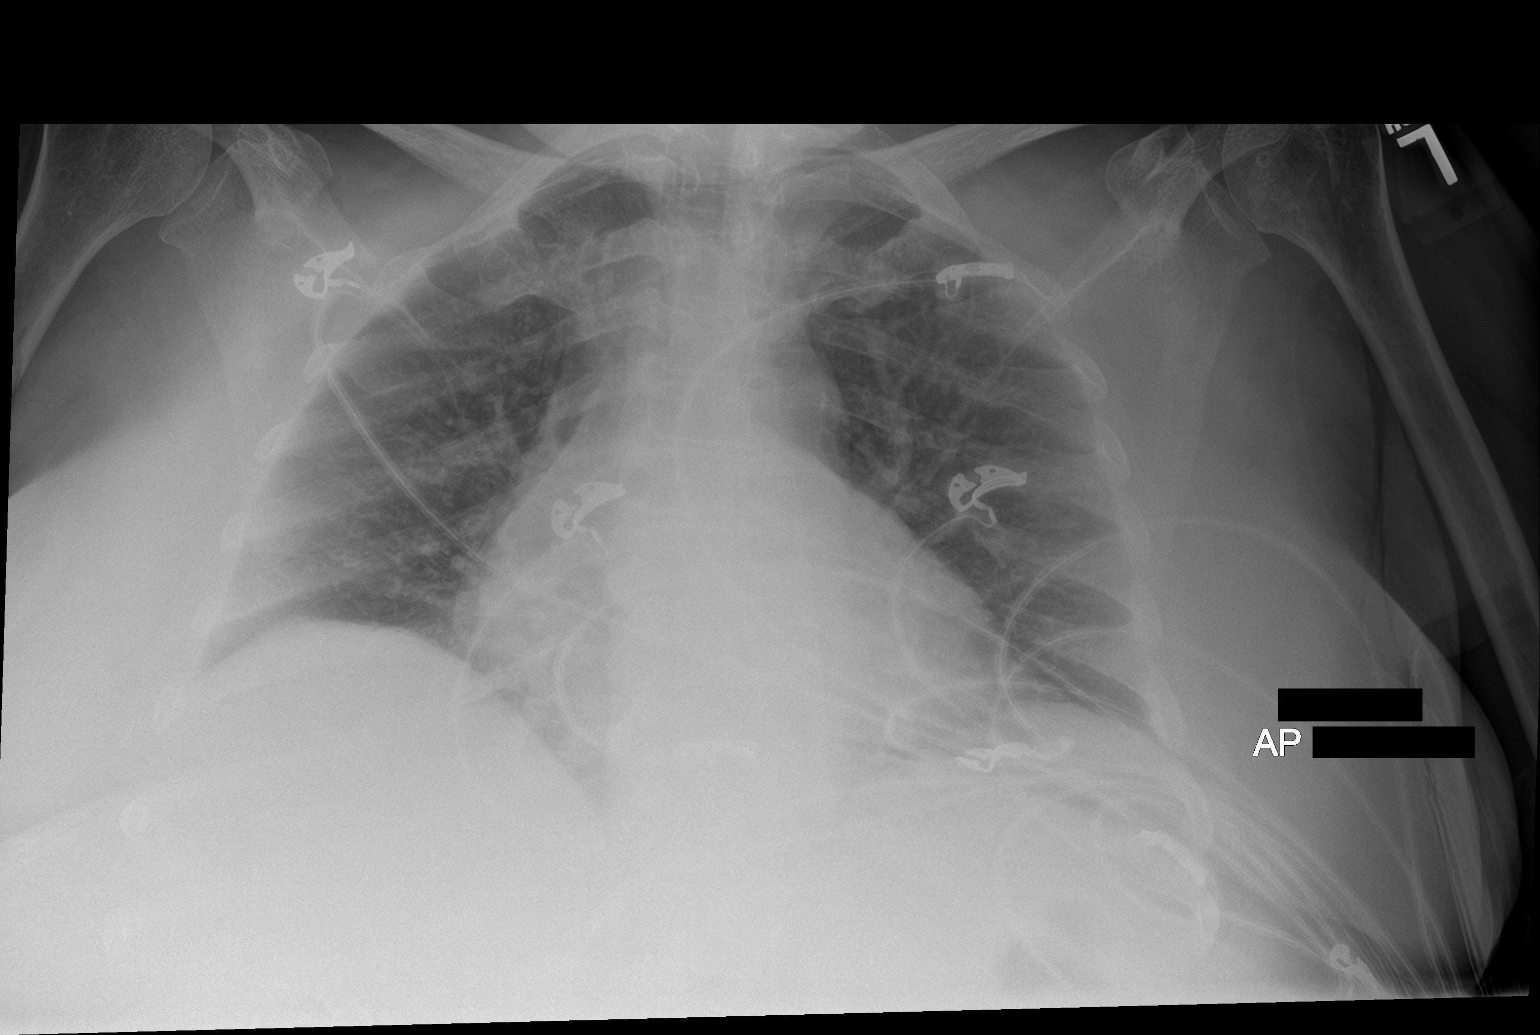

[3 of 3 positions shown; findings below may reference images not displayed]

FINDINGS: The lungs are mildly hypoinflated but clear. The cardiac silhouette
is enlarged. The pulmonary vascularity is not clearly engorged.
There is calcification in the wall of the aortic arch. There is no
pleural effusion or pneumothorax. Left rib detail images reveal no
acute displaced fractures.
IMPRESSION: Mild hypoinflation. No pneumothorax, pulmonary contusion, or pleural
effusion. No definite acute left rib fracture.

Thoracic aortic atherosclerosis.

## 2019-05-27 ENCOUNTER — Ambulatory Visit (INDEPENDENT_AMBULATORY_CARE_PROVIDER_SITE_OTHER): Payer: Medicare HMO | Admitting: Psychiatry

## 2019-05-27 ENCOUNTER — Other Ambulatory Visit: Payer: Self-pay

## 2019-05-27 DIAGNOSIS — F331 Major depressive disorder, recurrent, moderate: Secondary | ICD-10-CM

## 2019-05-27 IMAGING — CT CT HEAD W/O CM
3 series · 14 of 46 positions shown, 16 images · non-contrast
Comparison: CT HEAD October 28, 2017 and MRI head October 25, 2017.

CLINICAL DATA: Altered level of consciousness. History of recurrent
planum sphenoidale meningioma.

EXAM:
CT HEAD WITHOUT CONTRAST
TECHNIQUE: Contiguous axial images were obtained from the base of the skull
through the vertex without intravenous contrast.

[Series 2: head trauma wo · axial · 0.44mm/px · z∈[+1557,+1677]mm · 8 of 29 slices shown, 10 images]
[im 3/29  brain]
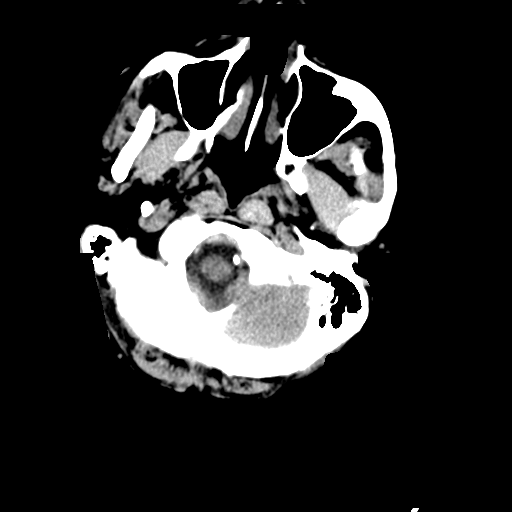
[im 3/29  bone]
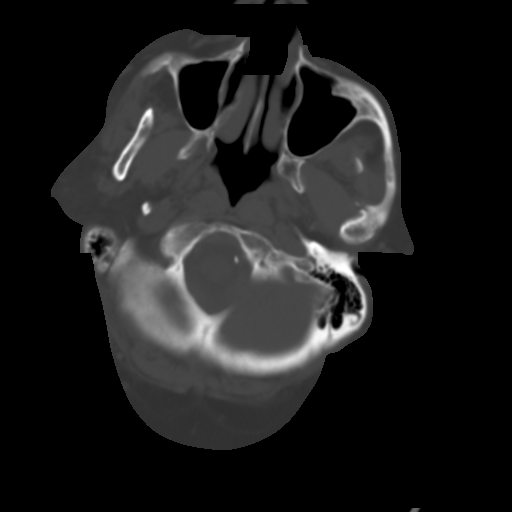
[im 7/29  brain]
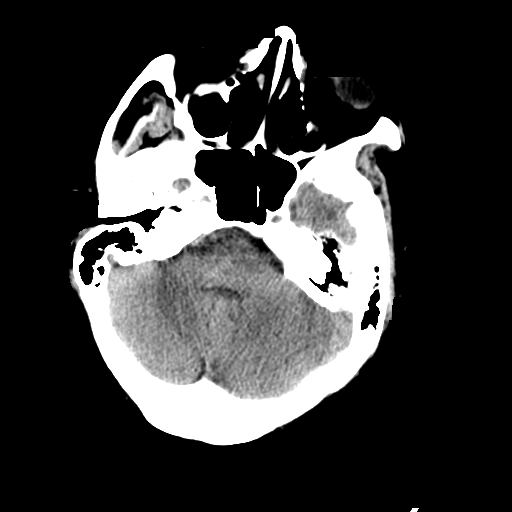
[im 10/29  brain]
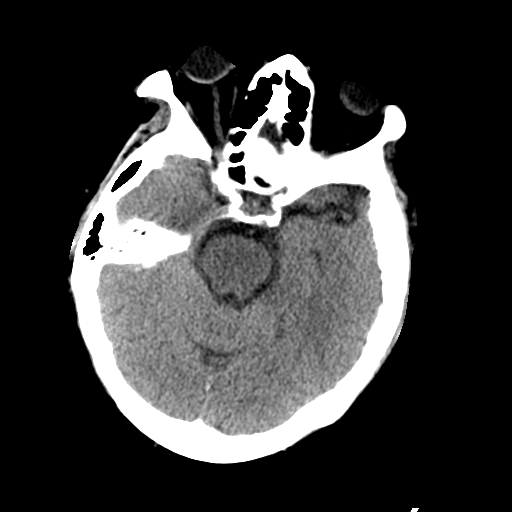
[im 13/29  brain]
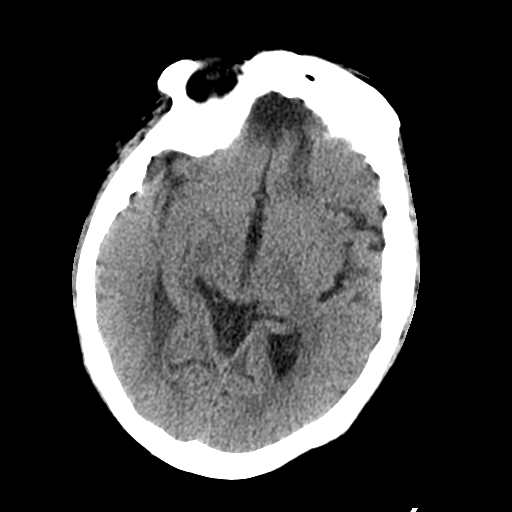
[im 17/29  brain]
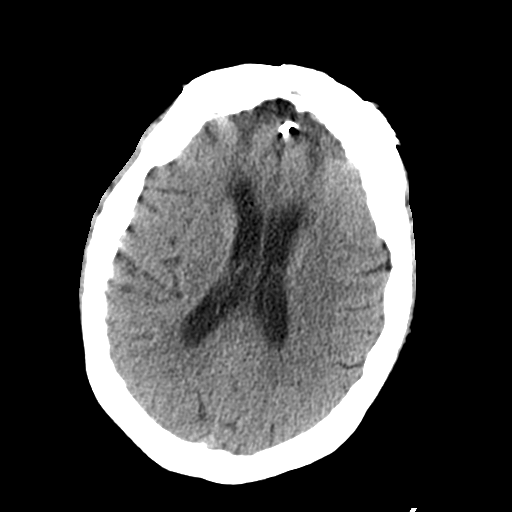
[im 17/29  bone]
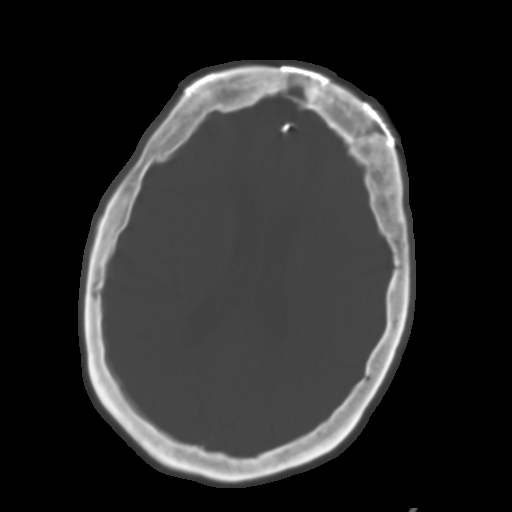
[im 20/29  brain]
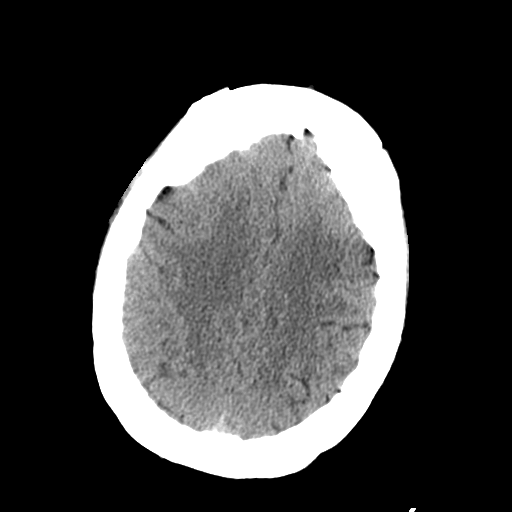
[im 23/29  brain]
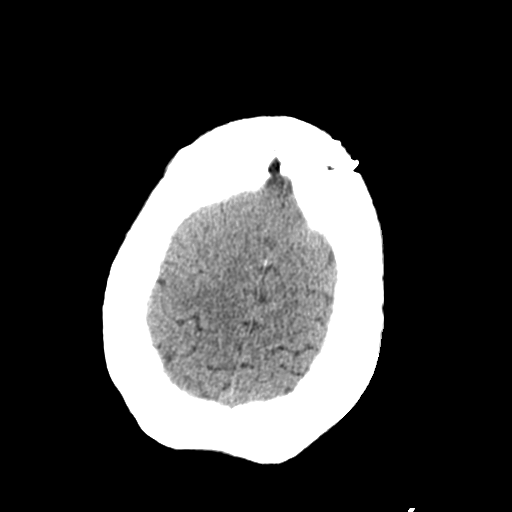
[im 27/29  brain]
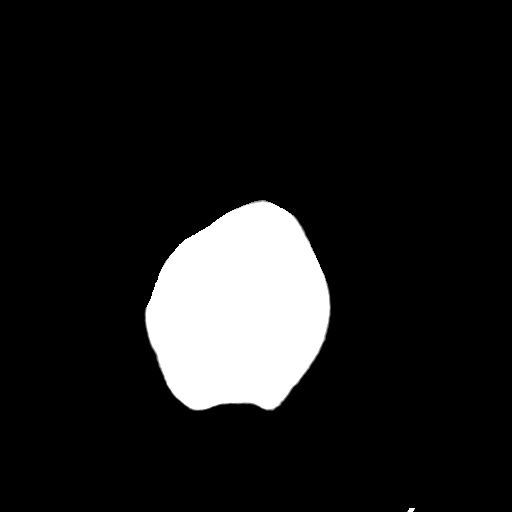

[Series 4: coronal soft tissue · coronal · 0.30mm/px · 3 of 72 slices shown]
[im 24/72  brain]
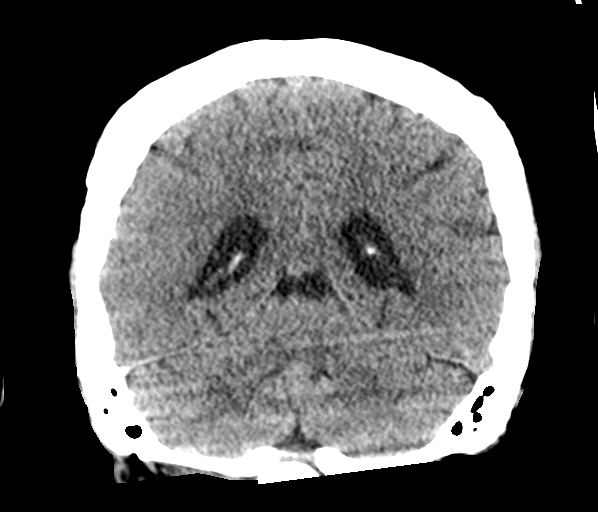
[im 32/72  brain]
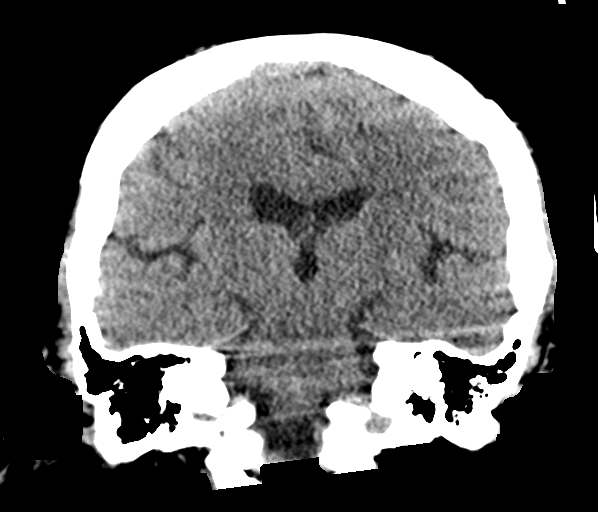
[im 40/72  brain]
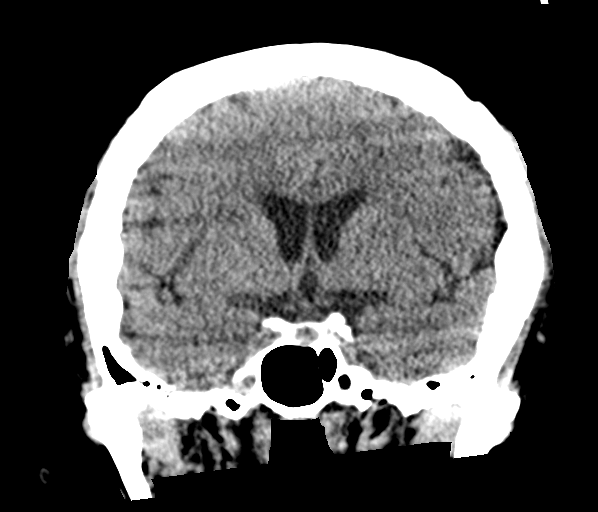

[Series 5: sagittal soft tissue · sagittal · 0.29mm/px · 3 of 57 slices shown]
[im 21/57  brain]
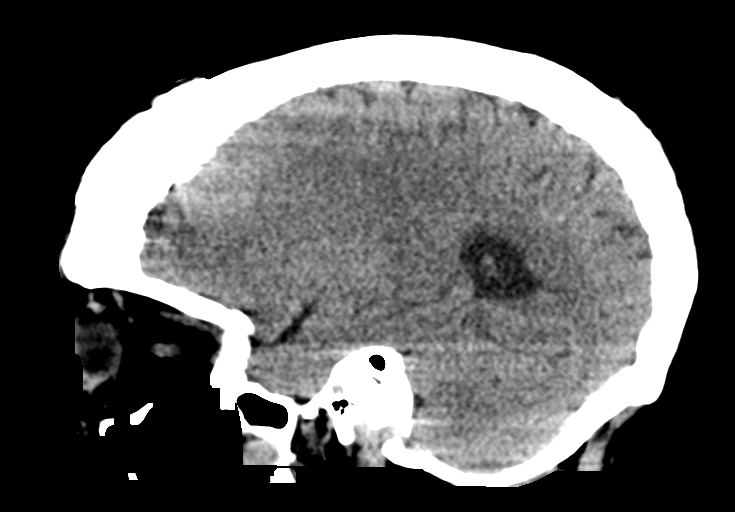
[im 29/57  brain]
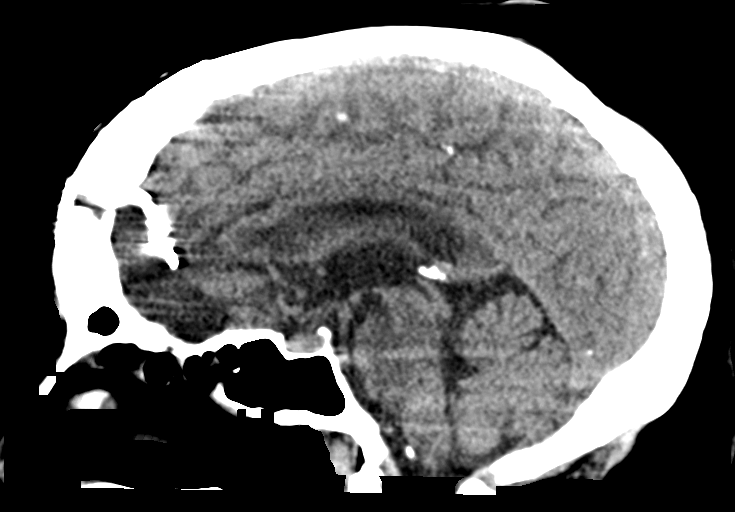
[im 36/57  brain]
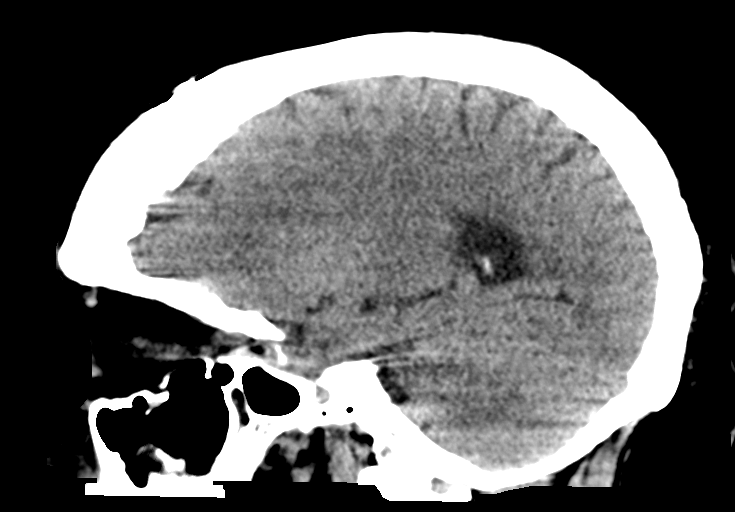

[14 of 46 positions shown; findings below may reference images not displayed]

FINDINGS: Mild motion degraded examination. BRAIN: Bifrontal encephalomalacia,
unchanged. Mild ex vacuo dilatation frontal horn lateral ventricles.
Intraparenchymal hemorrhage, mass effect nor midline shift. No
hydrocephalus. Patchy supratentorial white matter hypodensities
within normal range for patient's age, though non-specific are most
compatible with chronic small vessel ischemic disease. No acute
large vascular territory infarcts. No abnormal extra-axial fluid
collections. Small anterior cranial fossa meningioma better
demonstrated on prior MRI. Basal cisterns are patent.Surgical clip
along the anterior falx.

VASCULAR: Moderate calcific atherosclerosis of the carotid siphons.

SKULL: No skull fracture. Severe temporal bone tibial or
osteoarthrosis. Bifrontal craniotomy. No significant scalp soft
tissue swelling.

SINUSES/ORBITS: Trace paranasal sinus mucosal thickening. Mastoid
air cells are well aerated.The included ocular globes and orbital
contents are non-suspicious. Status post bilateral ocular lens
implants.

OTHER: RIGHT nasal labial calcification, potentially post-injection.
IMPRESSION: 1. Motion degraded examination without acute intracranial process.
2. Bifrontal postoperative encephalomalacia. Small anterior cranial
fossa meningioma better demonstrated on recent MRI.

## 2019-05-27 NOTE — Progress Notes (Signed)
Virtual Visit via Telephone Note  I connected with [name] on 05/27/19 at 3:10 PM EDT by telephone and verified that I am speaking with the correct person using two identifiers.   I discussed the limitations, risks, security and privacy concerns of performing an evaluation and management service by telephone and the availability of in person appointments. I also discussed with the patient that there may be a patient responsible charge related to this service. The patient expressed understanding and agreed to proceed.  I provided 45  minutes of non-face-to-face time during this encounter.   [name], LCSW             THERAPIST PROGRESS NOTE  Session Time: Tuesday 05/27/2019 3:10 PM - 3:55 PM   Participation Level: Active  Behavioral Response: lethargic, depressed, anxious  Type of Therapy: Individual Therapy  Treatment Goals addressed: Learn and implement behavioral strategies to overcome depression.                                                       Verbalize an understanding and resolution of current interpersonal problems.   Interventions: CBT and Supportive   Summary: [name] is a 69 y.o. female who presents with  long standing history of recurrent periods  of depression beginning when she was 37 and her favorite uncle died. Patient reports multiple psychiatric hospitlaizations due to depression and suicidal ideations with the last one occuring in 1997. Patient has participated in outpatient psychotherapy and medication management intermittently since age 62. She currently is seeing psychiatrist Dr. Harrington Challenger . Prior to this, she was seen at [facility]. Patient also has had ECT at [facility]. Symptoms have worsened in recent months due to family stress and have  included depressed mood, anxiety, excessive worry, and tearfulness.         Patient's last contact was by virtual visit via telephone about  3- 4 weeks ago. She continues to experience moderate symptoms of  depression including somatic complaints, irritability, tearfulness, and anger.  Patient states feeling trapped due to problems with her aide.  Per patient's report, her aide has been stealing items from her home, does not perform duties she is assigned, and eavesdrops on patient's conversations.  She expresses frustration as she says she has tried to talk with some people about the situation but people do not believe her.  She has made unsuccessful attempts to try to contact her aide supervisor.  She also reports she has talked with the aide about her concerns but aide denies any wrongdoing per patient's report.  Patient is pleased that she was able to be assertive rather than aggressive with aide.  She reports she is using her spirituality to try to cope and is writing out her prayers.  She also has had contact from her new pastor and is very pleased about this.  Patient also has been attending Bible study on the phone.      Suicidal/Homicidal: Nowithout intent/plan  Therapist Response: Reviewed symptoms, discussed stressors, facilitated expression of thoughts and feelings, validated feelings, praised and reinforced patient's use of her spirituality to help cope, assisted patient with problem solving and develop plan with patient to schedule visits and appointments for business matters during times the aide is not scheduled to be at her home, also discussed expressing concerns to  her aide's supervisor, developed plan with patient to use writing to express her feelings to manage anger and also use deep breathing to trigger relaxation response, courage patient to continue participating in spiritual activities  ,  Plan: Return again in 2 weeks.  Diagnosis: Axis I: MDD, Recurrent, moderate    Axis II: Deferred    Ashlin Hidalgo E Amaira Safley, LCSW

## 2019-05-29 DIAGNOSIS — G4733 Obstructive sleep apnea (adult) (pediatric): Secondary | ICD-10-CM | POA: Diagnosis not present

## 2019-05-29 DIAGNOSIS — E785 Hyperlipidemia, unspecified: Secondary | ICD-10-CM | POA: Diagnosis not present

## 2019-05-30 ENCOUNTER — Other Ambulatory Visit: Payer: Self-pay | Admitting: *Deleted

## 2019-05-30 ENCOUNTER — Ambulatory Visit: Payer: Medicare HMO | Admitting: *Deleted

## 2019-05-30 DIAGNOSIS — Z79899 Other long term (current) drug therapy: Secondary | ICD-10-CM | POA: Diagnosis not present

## 2019-05-30 DIAGNOSIS — G894 Chronic pain syndrome: Secondary | ICD-10-CM | POA: Diagnosis not present

## 2019-05-30 NOTE — Patient Outreach (Signed)
[facility]) Care Management  05/30/2019  [name] 1951-03-07 [id number]   CSW was only able to make brief contact with patient today, as patient admitted that she was sleeping at the time of CSW's call.  Patient actually fell asleep twice during the call, encouraging CSW to contact her again next week, preferably at the end of the week, to assist with completion of her Advanced Directives ([place] documents).  CSW agreed to contact patient on Thursday, June 05, 2019, around 9:00am, to offer assistance.  [name], BSW, MSW, LCSW  Licensed Education officer, [facility] [employer] [facility]  [address]ailing [place] [address] [place], [place] [address] Physical Address-[address], [place], [place] [address] Toll Free Main # [phone] Fax # [phone] Cell # [phone]  Office # [phone] [name]

## 2019-05-31 ENCOUNTER — Ambulatory Visit
Admission: EM | Admit: 2019-05-31 | Discharge: 2019-05-31 | Disposition: A | Discharge status: Home / Self Care | Payer: Medicare HMO | Attending: Emergency Medicine | Admitting: Emergency Medicine

## 2019-05-31 ENCOUNTER — Other Ambulatory Visit: Payer: Self-pay

## 2019-05-31 DIAGNOSIS — T7840XA Allergy, unspecified, initial encounter: Secondary | ICD-10-CM | POA: Diagnosis not present

## 2019-05-31 DIAGNOSIS — T50Z95A Adverse effect of other vaccines and biological substances, initial encounter: Secondary | ICD-10-CM

## 2019-05-31 MED ORDER — PREDNISONE 20 MG PO TABS: 20.0000 mg | ORAL_TABLET | Freq: Every day | ORAL | 0 refills | Status: AC

## 2019-05-31 NOTE — Discharge Instructions (Signed)
Continue with ice compresses, gentle stretching, massage, and OTC tylenol Prednisone prescribed.  Take as directed and to completion Follow up with PCP for recheck and ensure symptoms are improving Return or go to the ED if you have any new or worsening symptoms such as difficulty breathing, shortness of breath, chest pain, nausea, vomiting, throat tightness or swelling, tongue swelling or tingling, worsening lip or facial swelling, abdominal pain, changes in bowel or bladder habits, no improvement despite medications, etc..[name]

## 2019-05-31 NOTE — ED Triage Notes (Signed)
Pt received 2nd covid shot last week and right arm began hurting and itching today at site of injection and below

## 2019-05-31 NOTE — ED Provider Notes (Signed)
[place]   [id number] 05/31/19 Arrival Time: [id number]  Cc: Allergic reaction  SUBJECTIVE:  [name] is a 69 y.o. female who presents with possible allergic reaction that began today.  Received second dose of COVID vaccine apx 1 week ago to RT shoulder.  Complains of associated pain, itching, and swelling over injection site.  Tender to the touch.  Has tried OTC medications without relief.   Denies previous symptoms in the past with first injection.  Denies fever, chills, nausea, vomiting, oral manifestations such as throat swelling/ tingling, mouth swelling/ tingling, tongue swelling/tingling, dyspnea, SOB, chest pain, abdominal pain, changes in bowel or bladder function.     ROS: As per HPI.  All other pertinent ROS negative.     Past Medical History:  Diagnosis Date  . Allergy   . Anemia   . Anxiety    takes Ativan daily  . Arthritis   . Assistance needed for mobility   . Bipolar disorder ([place])    takes Risperdal nightly  . Blood transfusion   . Cancer ([place])    In her gum  . Carpal tunnel syndrome of right wrist 05/23/2011  . Cervical disc disorder with radiculopathy of cervical region 10/31/2012  . Chronic back pain   . Chronic idiopathic constipation   . Chronic neck and back pain   . Colon polyps   . COPD (chronic obstructive pulmonary disease) with chronic bronchitis ([place]) 09/16/2013   Office Spirometry 10/30/2013-submaximal effort based on appearance of loop and curve. Numbers would fit with severe restriction but her physiologic capability may be better than this. FVC 0.91/44%, and 10.74/45%, FEV1/FVC 0.81, FEF 25-75% 1.43/69%    . Diabetes mellitus    Type II  . Diverticulosis    TCS 9/08 by Dr. Delfin Edis for diarrhea . Bx for micro scopic colitis negative.   . Fibromyalgia   . Frequent falls   . GERD (gastroesophageal reflux disease)    takes Aciphex daily  . Glaucoma    eye drops daily  . Gum symptoms    infection on antibiotic  . Hemiplegia  affecting non-dominant side, post-stroke 08/02/2011  . Hiatal hernia   . Hyperlipidemia    takes Crestor daily  . Hypertension    takes Amlodipine,Metoprolol,and Clonidine daily  . Hypothyroidism    takes Synthroid daily  . IBS (irritable bowel syndrome)   . Insomnia    takes Trazodone nightly  . Major depression, recurrent ([place])    takes Zoloft daily  . 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 Metabolic encephalopathy [id number]  . Migraines    chronic headaches  . Mononeuritis lower limb   . Narcolepsy   . Osteoporosis   . Pancreatitis 2006   due to Depakote with normal EUS   . Schatzki's ring    non critical / EGD with ED 8/2011with RMR  . Seizures ([place])    takes Lamictal daily.Last seizure 3 yrs ago  . Sleep apnea    on CPAP  . Small bowel obstruction ([place])   . Stroke [facility])    left sided weakness, speech changes  . Tubular adenoma of colon    Past Surgical History:  Procedure Laterality Date  . ABDOMINAL HYSTERECTOMY  1978  . BACK SURGERY  July 2012  . BACTERIAL OVERGROWTH TEST N/A 05/05/2013   Procedure: BACTERIAL OVERGROWTH TEST;  Surgeon: Daneil Dolin, MD;  [facility];  Service: Endoscopy;  Laterality: N/A;  7:30  . BIOPSY THYROID  2009  . BRAIN SURGERY  11/2011   resection of meningioma  . BREAST REDUCTION SURGERY  1994  . CARDIAC CATHETERIZATION  05/10/2005   normal coronaries, normal LV systolic function and EF (Dr. Jackie Plum)  . CARPAL TUNNEL RELEASE Left 07/22/04   Dr. Aline Brochure  . CATARACT EXTRACTION Bilateral   . CHOLECYSTECTOMY  1984  . COLONOSCOPY N/A 09/25/2012   [id number] diverticulosis.  colonic polyp-removed : tubular adenoma  . CRANIOTOMY  11/23/2011   Procedure: CRANIOTOMY TUMOR EXCISION;  Surgeon: Hosie Spangle, MD;  Location: [place] [facility] NEURO ORS;  Service: Neurosurgery;  Laterality: N/A;  Craniotomy for tumor resection  . ESOPHAGOGASTRODUODENOSCOPY  12/29/2010   Rourk-Retained food in the esophagus and stomach, small hiatal hernia, status  post Maloney dilation of the esophagus  . ESOPHAGOGASTRODUODENOSCOPY N/A 09/25/2012   [id number] atonic baggy esophagus status post Maloney dilation 6 F. Hiatal hernia  . GIVENS CAPSULE STUDY N/A 01/15/2013   NORMAL.   . IR GENERIC HISTORICAL  03/17/2016   IR RADIOLOGIST EVAL & MGMT 03/17/2016 [facility]-INTERV RAD  . LESION REMOVAL N/A 05/31/2015   Procedure: REMOVAL RIGHT AND LEFT LESIONS OF MANDIBLE;  Surgeon: Diona Browner, DDS;  Location: [facility];  Service: Oral Surgery;  Laterality: N/A;  . MALONEY DILATION  12/29/2010   RMR;  . NM MYOCAR PERF WALL MOTION  2006   "relavtiely normal" persantine, mild anterior thinning (breast attenuation artifact), no region of scar/ischemia  . OVARIAN CYST REMOVAL    . RECTOCELE REPAIR N/A 06/29/2015   Procedure: POSTERIOR REPAIR (RECTOCELE);  Surgeon: Jonnie Kind, MD;  Location: [facility];  Service: Gynecology;  Laterality: N/A;  . REDUCTION MAMMAPLASTY Bilateral   . SPINE SURGERY  09/29/2010   Dr. Rolena Infante  . surgical excision of 3 tumors from right thigh and right buttock  and left upper thigh  2010  . TOOTH EXTRACTION Bilateral 12/14/2014   Procedure: REMOVAL OF BILATERAL MANDIBULAR EXOSTOSES;  Surgeon: Diona Browner, DDS;  Location: [facility];  Service: Oral Surgery;  Laterality: Bilateral;  . TRANSTHORACIC ECHOCARDIOGRAM  2010   EF 60-65%, mild conc LVH, grade 1 diastolic dysfunction; mildly calcified MV annulus with mildly thickened leaflets, mildly calcified MR annulus   Allergies  Allergen Reactions  . Cephalexin Hives  . Iron Nausea And Vomiting  . Milk-Related Compounds Other (See Comments)    Doesn't agree with stomach.   . Penicillins Hives    Has patient had a PCN reaction causing immediate rash, facial/tongue/throat swelling, SOB or lightheadedness with hypotension: Yes Has patient had a PCN reaction causing severe rash involving mucus membranes or skin necrosis: No Has patient had a PCN reaction that required hospitalization No Has patient had a PCN  reaction occurring within the last 10 years: No If all of the above answers are "NO", then may proceed with Cephalosporin use.   [name] Phenazopyridine Hcl Hives         Current Facility-Administered Medications on File Prior to Encounter  Medication Dose Route Frequency Provider Last Rate Last Admin  . 0.9 %  sodium chloride infusion   Intravenous Continuous Lockamy, Randi L, NP-C   Stopped at 12/31/18 1542   Current Outpatient Medications on File Prior to Encounter  Medication Sig Dispense Refill  . ACCU-CHEK AVIVA PLUS test strip CHECK BLOOD SUGAR FOUR TIMES DAILY 400 strip 11  . albuterol (VENTOLIN HFA) 108 (90 Base) MCG/ACT inhaler Inhale 2 puffs into the lungs every 6 (six) hours as needed for wheezing or shortness of breath. 18 g 0  . alendronate (FOSAMAX) 70 MG  tablet Take 1 tablet (70 mg total) by mouth every 7 (seven) days. Take with a full glass of water on an empty stomach. 12 tablet 3  . amLODipine (NORVASC) 10 MG tablet TAKE 1 TABLET EVERY DAY 90 tablet 3  . ascorbic acid (VITAMIN C) 500 MG tablet Take 500 mg by mouth daily.    [name] aspirin EC 81 MG tablet Take 1 tablet (81 mg total) by mouth daily with breakfast. 120 tablet 2  . cetirizine (ZYRTEC) 10 MG tablet Take 1 tablet (10 mg total) by mouth daily. 7 tablet 0  . clobetasol cream (TEMOVATE) [id number] % Apply 1 application topically 2 (two) times daily.     . cromolyn (NASALCROM) 5.2 MG/ACT nasal spray Place 1 spray into both nostrils 4 (four) times daily. 13 mL 1  . diclofenac sodium (VOLTAREN) 1 % GEL APPLY 2 GRAMS  TOPICALLY 4 (FOUR) TIMES DAILY. 200 g 1  . diclofenac Sodium (VOLTAREN) 1 % GEL APPLY 2 GRAMS  TOPICALLY 4 (FOUR) TIMES DAILY. 800 g 0  . ezetimibe (ZETIA) 10 MG tablet Take 1 tablet (10 mg total) by mouth daily. 90 tablet 1  . fluticasone (FLONASE) 50 MCG/ACT nasal spray Place 2 sprays into both nostrils daily.    . hydroxychloroquine (PLAQUENIL) 200 MG tablet     . insulin aspart (NOVOLOG FLEXPEN) 100 UNIT/ML FlexPen  INJECT 10 TO 14 UNITS INTO THE SKIN 3 (THREE) TIMES DAILY WITH MEALS. 45 mL 3  . Insulin Glargine, 2 Unit Dial, (TOUJEO MAX SOLOSTAR) 300 UNIT/ML SOPN Inject 25 Units into the skin at bedtime. 9 pen 3  . lamoTRIgine (LAMICTAL) 100 MG tablet TAKE 1 TABLET TWICE DAILY 180 tablet 2  . Lancets (ACCU-CHEK SOFT TOUCH) lancets Use as instructed to check blood sugar 3 times a day. 100 each 12  . levothyroxine (SYNTHROID) 50 MCG tablet TAKE 1 TABLET DAILY, EXCEPT TAKE 1/2 TABLET ON SUNDAY 85 tablet 1  . linaclotide (LINZESS) 145 MCG CAPS capsule Take 1 capsule (145 mcg total) by mouth daily before breakfast. 30 capsule 6  . LORazepam (ATIVAN) 0.5 MG tablet Take 1 tablet (0.5 mg total) by mouth 3 (three) times daily. 270 tablet 2  . losartan (COZAAR) 50 MG tablet TAKE 1 TABLET EVERY DAY 90 tablet 1  . lubiprostone (AMITIZA) 24 MCG capsule Take 1 capsule (24 mcg total) by mouth 2 (two) times daily with a meal. 60 capsule 6  . metaxalone (SKELAXIN) 800 MG tablet metaxalone 800 mg tablet  take 1 tablet by mouth 3 times daily as needed.    . metoprolol tartrate (LOPRESSOR) 50 MG tablet TAKE 1 TABLET TWICE DAILY (NEED MD APPOINTMENT) 180 tablet 3  . montelukast (SINGULAIR) 10 MG tablet TAKE 1 TABLET EVERY DAY 90 tablet 1  . NEOMYCIN-POLYMYXIN-HC, OPHTH, SUSP neomycin 3.5 mg-polymyxin 10,000 unit-hydrocort 10 mg/mL eye drop,susp    . nystatin (MYCOSTATIN/NYSTOP) powder APPLY TO AFFECTED AREA 4 TIMES DAILY. 90 g 1  . Omega-3 Fatty Acids (FISH OIL PO) Take 1 capsule by mouth daily.    . ondansetron (ZOFRAN) 4 MG tablet Take 1 tablet (4 mg total) by mouth every 8 (eight) hours as needed for nausea or vomiting. 20 tablet 0  . oxyCODONE-acetaminophen (PERCOCET) 10-325 MG tablet Take 1 tablet by mouth every 8 (eight) hours as needed.     . pregabalin (LYRICA) 75 MG capsule Take 1 capsule (75 mg total) by mouth daily.    . RABEprazole (ACIPHEX) 20 MG tablet TAKE 1 TABLET TWICE DAILY 180  tablet 3  . RESTASIS 0.05 %  ophthalmic emulsion Place 1 drop into both eyes 2 (two) times daily.     . risperiDONE (RISPERDAL) 0.5 MG tablet Take 1 tablet (0.5 mg total) by mouth at bedtime. 90 tablet 2  . rosuvastatin (CRESTOR) 5 MG tablet TAKE 1 TABLET AT BEDTIME 90 tablet 2  . sertraline (ZOLOFT) 100 MG tablet Take 1.5 tablets (150 mg total) by mouth daily. 135 tablet 2  . SHINGRIX injection     . STIOLTO RESPIMAT 2.5-2.5 MCG/ACT AERS INHALE 2 PUFFS INTO THE LUNGS DAILY. 12 g 3  . traZODone (DESYREL) 100 MG tablet Take 1 tablet (100 mg total) by mouth at bedtime. 90 tablet 2    Social History   Socioeconomic History  . Marital status: Divorced    Spouse name: Not on file  . Number of children: 1  . Years of education: 11  . Highest education level: High school graduate  Occupational History  . Occupation: Disabled  Tobacco Use  . Smoking status: Current Every Day Smoker    Packs/day: 0.50    Years: 30.00    Pack years: 15.00    Types: Cigarettes  . Smokeless tobacco: Never Used  . Tobacco comment: 5-10 cigs a day  Substance and Sexual Activity  . Alcohol use: No    Alcohol/week: 0.0 standard drinks    Comment:    . Drug use: No  . Sexual activity: Not Currently  Other Topics Concern  . Not on file  Social History Narrative   01/29/18 Lives alone, has 3 aides, Mon- Fri 8 hrs, 2 hrs on Sat-Sun, RN manages her meds   Caffeine use: Drink coffee sometimes    Right handed    Social Determinants of Health   Financial Resource Strain: Low Risk   . Difficulty of Paying Living Expenses: Not very hard  Food Insecurity: No Food Insecurity  . Worried About Charity fundraiser in the Last Year: Never true  . Ran Out of Food in the Last Year: Never true  Transportation Needs: No Transportation Needs  . Lack of Transportation (Medical): No  . Lack of Transportation (Non-Medical): No  Physical Activity: Inactive  . Days of Exercise per Week: 0 days  . Minutes of Exercise per Session: 0 min  Stress: Stress  Concern Present  . Feeling of Stress : To some extent  Social Connections: Somewhat Isolated  . Frequency of Communication with Friends and Family: More than three times a week  . Frequency of Social Gatherings with Friends and Family: More than three times a week  . Attends Religious Services: More than 4 times per year  . Active Member of Clubs or Organizations: No  . Attends Archivist Meetings: Never  . Marital Status: Divorced  Human resources officer Violence: Not At Risk  . Fear of Current or Ex-Partner: No  . Emotionally Abused: No  . Physically Abused: No  . Sexually Abused: No   Family History  Problem Relation Age of Onset  . Heart attack Mother        HTN  . Pneumonia Father   . Kidney failure Father   . Diabetes Father   . Pancreatic cancer Sister   . Diabetes Brother   . Hypertension Brother   . Diabetes Brother   . Cancer Sister        breast   . Hypertension Son   . Sleep apnea Son   . Cancer Sister  pancreatic  . Stroke Maternal Grandmother   . Heart attack Maternal Grandfather   . Alcohol abuse Maternal Uncle   . Colon cancer Neg Hx   . Anesthesia problems Neg Hx   . Hypotension Neg Hx   . Malignant hyperthermia Neg Hx   . Pseudochol deficiency Neg Hx   . Breast cancer Neg Hx      OBJECTIVE:  Vitals:   05/31/19 1445  BP: (!) 125/52  Pulse: 65  Resp: 18  Temp: 98.9 F (37.2 C)  SpO2: 94%     General appearance: Alert, NAD HEENT: NCAT; no obvious facial swelling; Eyes: EOM grossly intact Neck: supple without LAD Lungs: normal respiratory effort CV: RT Radial pulse 2+ Skin: warm and dry; possible slight swelling over injection site to RT lateral deltoid, TTP, no obvious drainage. erythema or bleeding Psychological: alert and cooperative; normal mood and affect  ASSESSMENT & PLAN:  1. Allergic reaction, initial encounter   2. Adverse effect of vaccine, initial encounter     Meds ordered this encounter  Medications  .  predniSONE (DELTASONE) 20 MG tablet    Sig: Take 1 tablet (20 mg total) by mouth daily for 5 days.    Dispense:  5 tablet    Refill:  0    Order Specific Question:   Supervising Provider    Answer:   Raylene Everts [id number]   Continue with ice compresses, gentle stretching, massage, and OTC tylenol Prednisone prescribed.  Take as directed and to completion Follow up with PCP for recheck and ensure symptoms are improving Return or go to the ED if you have any new or worsening symptoms such as difficulty breathing, shortness of breath, chest pain, nausea, vomiting, throat tightness or swelling, tongue swelling or tingling, worsening lip or facial swelling, abdominal pain, changes in bowel or bladder habits, no improvement despite medications, etc...   Reviewed expectations re: course of current medical issues. Questions answered. Outlined signs and symptoms indicating need for more acute intervention. Patient verbalized understanding. After Visit Summary given.          Lestine Box, PA-C 05/31/19 1506

## 2019-06-03 DIAGNOSIS — M25512 Pain in left shoulder: Secondary | ICD-10-CM | POA: Diagnosis not present

## 2019-06-05 ENCOUNTER — Other Ambulatory Visit: Payer: Self-pay | Admitting: *Deleted

## 2019-06-05 NOTE — Patient Outreach (Signed)
[facility]) Care Management  06/05/2019  Raesha Borin Gardiner 07/12/1950 [id number]  CSW was able to make contact with patient today to follow-up regarding social work services and resources, as well as to try and assist patient with completion of her Advanced Directives ([place] documents).  Patient admitted that she was not feeling well today, not wanting to complete her Advanced Directives at this time.  CSW voiced understanding, explaining to patient that this was CSW's third attempt at trying to assist patient with completion of her documents.  Patient encouraged CSW to contact her again in a week, hoping that she will be feeling better by then.  CSW agreed to contact patient on Monday, June 16, 2019, around 10:00am.  CSW was able to ensure that patient has the correct contact information for CSW, encouraging patient to contact CSW directly, if she has a pain-free day between now and the 5th, and wants to complete her Advanced Directives.  [name], BSW, MSW, LCSW  Licensed Education officer, [facility]  Mailing [place] [address], [place], [place] [address] Physical Address-[address], [place], [place] [address] Toll Free Main # [phone] Fax # [phone] Cell # [phone]  Office # [phone] [name]

## 2019-06-06 ENCOUNTER — Telehealth: Payer: Self-pay | Admitting: Internal Medicine

## 2019-06-06 NOTE — Telephone Encounter (Signed)
Complains of vague pain after meals for 2 months. Extensive GI history reviewed. No worrisome features. Told to call office Monday morning for an appointment. Will share with her primary GI MD, Dr. Hilarie Fredrickson.

## 2019-06-09 ENCOUNTER — Telehealth: Payer: Self-pay | Admitting: Internal Medicine

## 2019-06-09 DIAGNOSIS — J449 Chronic obstructive pulmonary disease, unspecified: Secondary | ICD-10-CM | POA: Diagnosis not present

## 2019-06-09 DIAGNOSIS — E039 Hypothyroidism, unspecified: Secondary | ICD-10-CM | POA: Diagnosis not present

## 2019-06-09 DIAGNOSIS — J42 Unspecified chronic bronchitis: Secondary | ICD-10-CM | POA: Diagnosis not present

## 2019-06-09 DIAGNOSIS — M160 Bilateral primary osteoarthritis of hip: Secondary | ICD-10-CM | POA: Diagnosis not present

## 2019-06-09 DIAGNOSIS — E1142 Type 2 diabetes mellitus with diabetic polyneuropathy: Secondary | ICD-10-CM | POA: Diagnosis not present

## 2019-06-09 DIAGNOSIS — F17210 Nicotine dependence, cigarettes, uncomplicated: Secondary | ICD-10-CM | POA: Diagnosis not present

## 2019-06-09 DIAGNOSIS — D649 Anemia, unspecified: Secondary | ICD-10-CM | POA: Diagnosis not present

## 2019-06-09 DIAGNOSIS — E1165 Type 2 diabetes mellitus with hyperglycemia: Secondary | ICD-10-CM | POA: Diagnosis not present

## 2019-06-09 DIAGNOSIS — F250 Schizoaffective disorder, bipolar type: Secondary | ICD-10-CM | POA: Diagnosis not present

## 2019-06-09 NOTE — Telephone Encounter (Signed)
Patient is calling- she spoke with Dr. Henrene Pastor on friday after hours and he told her to call Monday to make an appointment for pain after swallowing. I let her know Dr. Hilarie Fredrickson is completely booked right now and she does not want to see a PA. Asking to speak with nurse.

## 2019-06-10 ENCOUNTER — Ambulatory Visit (INDEPENDENT_AMBULATORY_CARE_PROVIDER_SITE_OTHER): Payer: Medicare HMO | Admitting: Family Medicine

## 2019-06-10 ENCOUNTER — Encounter: Payer: Self-pay | Admitting: Family Medicine

## 2019-06-10 ENCOUNTER — Other Ambulatory Visit: Payer: Self-pay

## 2019-06-10 VITALS — BP 127/78 | Ht 59.0 in | Wt 162.0 lb

## 2019-06-10 DIAGNOSIS — K219 Gastro-esophageal reflux disease without esophagitis: Secondary | ICD-10-CM | POA: Diagnosis not present

## 2019-06-10 MED ORDER — PANTOPRAZOLE SODIUM 40 MG PO TBEC: 40.0000 mg | DELAYED_RELEASE_TABLET | Freq: Every day | ORAL | 0 refills | Status: DC

## 2019-06-10 NOTE — Patient Instructions (Signed)
I appreciate the opportunity to provide you with care for your health and wellness. Today we discussed: stomach pains  Follow up: 08/26/2019 as scheduled  No labs or referrals today  Stop acidphex (do not throw away) Start Protonix for 2-4 weeks, if improved then we can wean back to Acidphex.  Please continue to practice social distancing to keep you, your family, and our community safe.  If you must go out, please wear a mask and practice good handwashing.  It was a pleasure to see you and I look forward to continuing to work together on your health and well-being. Please do not hesitate to call the office if you need care or have questions about your care.  Have a wonderful day and week. With Gratitude, Cherly Beach, DNP, AGNP-BC

## 2019-06-10 NOTE — Progress Notes (Signed)
Virtual Visit via Telephone Note   This visit type was conducted due to national recommendations for restrictions regarding the COVID-19 Pandemic (e.g. social distancing) in an effort to limit this patient's exposure and mitigate transmission in our community.  Due to her co-morbid illnesses, this patient is at least at moderate risk for complications without adequate follow up.  This format is felt to be most appropriate for this patient at this time.  The patient did not have access to video technology/had technical difficulties with video requiring transitioning to audio format only (telephone).  All issues noted in this document were discussed and addressed.  No physical exam could be performed with this format.    Evaluation Performed:  Follow-up visit  Date:  06/10/2019   ID:  [name], DOB Sep 18, 1950, MRN [id number]  Patient Location: Home Provider Location: Office  Location of Patient: Home Location of Provider: Telehealth Consent was obtain for visit to be over via telehealth. I verified that I am speaking with the correct person using two identifiers.  PCP:  [name], MD   Chief Complaint: Upset stomach  History of Present Illness:    [name] is a 69 y.o. female with history of anxiety, bipolar disorder, type 2 diabetes, GERD, hyperlipidemia among others. Presents today with upset stomach. Stomach started bothering her a week or so ago, seemed to get better, but she is not as hungry as she thinks she should be. Sunday she reports she had a low grade fever of 99.2, today she is normal.  Feels constipated, or like she needs to use restroom. Does use miralax-usually is a daily BM.  Reports having a bowel movement yesterday unsure what she will do today.  She reports she felt like her stomach was really hot and she drank some Sprite with ice and she felt that that cooled off and made it feel better.  She reports sometimes it feels like it is swelling out after  she eats.  Hurts after she eats in general.  Has reached out to her GI doctor but they cannot see her for couple weeks.  She reports taking all her medications without any issues.  Denies having any blood in her urine or stool.  Denies having any change in consistency of her stool that she has had recently.  Denies eating anything that would upset her stomach.  Denies having nausea vomiting or any other issues or concerns  The patient does not have symptoms concerning for COVID-19 infection (fever, chills, cough, or new shortness of breath).   Past Medical, Surgical, Social History, Allergies, and Medications have been Reviewed.  Past Medical History:  Diagnosis Date  . Allergy   . Anemia   . Anxiety    takes Ativan daily  . Arthritis   . Assistance needed for mobility   . Bipolar disorder ([place])    takes Risperdal nightly  . Blood transfusion   . Cancer ([place])    In her gum  . Carpal tunnel syndrome of right wrist 05/23/2011  . Cervical disc disorder with radiculopathy of cervical region 10/31/2012  . Chronic back pain   . Chronic idiopathic constipation   . Chronic neck and back pain   . Colon polyps   . COPD (chronic obstructive pulmonary disease) with chronic bronchitis ([place]) 09/16/2013   Office Spirometry 10/30/2013-submaximal effort based on appearance of loop and curve. Numbers would fit with severe restriction but her physiologic capability may be better than this. FVC  0.91/44%, and 10.74/45%, FEV1/FVC 0.81, FEF 25-75% 1.43/69%    . Diabetes mellitus    Type II  . Diverticulosis    TCS 9/08 by Dr. Delfin Edis for diarrhea . Bx for micro scopic colitis negative.   . Fibromyalgia   . Frequent falls   . GERD (gastroesophageal reflux disease)    takes Aciphex daily  . Glaucoma    eye drops daily  . Gum symptoms    infection on antibiotic  . Hemiplegia affecting non-dominant side, post-stroke 08/02/2011  . Hiatal hernia   . Hyperlipidemia    takes Crestor daily  . Hypertension     takes Amlodipine,Metoprolol,and Clonidine daily  . Hypothyroidism    takes Synthroid daily  . IBS (irritable bowel syndrome)   . Insomnia    takes Trazodone nightly  . Major depression, recurrent ([place])    takes Zoloft daily  . 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 Metabolic encephalopathy [id number]  . Migraines    chronic headaches  . Mononeuritis lower limb   . Narcolepsy   . Osteoporosis   . Pancreatitis 2006   due to Depakote with normal EUS   . Schatzki's ring    non critical / EGD with ED 8/2011with RMR  . Seizures ([place])    takes Lamictal daily.Last seizure 3 yrs ago  . Sleep apnea    on CPAP  . Small bowel obstruction ([place])   . Stroke [facility])    left sided weakness, speech changes  . Tubular adenoma of colon    Past Surgical History:  Procedure Laterality Date  . ABDOMINAL HYSTERECTOMY  1978  . BACK SURGERY  July 2012  . BACTERIAL OVERGROWTH TEST N/A 05/05/2013   Procedure: BACTERIAL OVERGROWTH TEST;  Surgeon: Daneil Dolin, MD;  [facility];  Service: Endoscopy;  Laterality: N/A;  7:30  . BIOPSY THYROID  2009  . BRAIN SURGERY  11/2011   resection of meningioma  . BREAST REDUCTION SURGERY  1994  . CARDIAC CATHETERIZATION  05/10/2005   normal coronaries, normal LV systolic function and EF (Dr. Jackie Plum)  . CARPAL TUNNEL RELEASE Left 07/22/04   Dr. Aline Brochure  . CATARACT EXTRACTION Bilateral   . CHOLECYSTECTOMY  1984  . COLONOSCOPY N/A 09/25/2012   [id number] diverticulosis.  colonic polyp-removed : tubular adenoma  . CRANIOTOMY  11/23/2011   Procedure: CRANIOTOMY TUMOR EXCISION;  Surgeon: Hosie Spangle, MD;  Location: [place] [facility]UR[facility] OR[facility];  Service: Neurosurgery;  Laterality: N/A;  Craniotomy for tumor resection  . ESOPHAGOGASTRODUODENOSCOPY  12/29/2010   [place]-Retained food in the esophagus and stomach, small hiatal hernia, status post Maloney dilation of the esophagus  . ESOPHAGOGASTRODUODENOSCOPY N/A 09/25/2012   [id number] atonic baggy esophagus status  post Maloney dilation 63 F. Hiatal hernia  . GIVENS CAPSULE STUDY N/A 01/15/2013   NORMAL.   . IR GENERIC HISTORICAL  03/17/2016   IR RADIOLOGIST EVAL & MGMT 03/17/2016 [facility] RAD  . LESION REMOVAL N/A 05/31/2015   Procedure: REMOVAL RIGHT AND LEFT LESIONS OF MANDIBLE;  Surgeon: Diona Browner, DDS;  Location: [place];  Service: Oral Surgery;  Laterality: N/A;  . MALONEY DILATION  12/29/2010   RMR;  . NM MYOCAR PERF WALL MOTION  2006   "relavtiely normal" persantine, mild anterior thinning (breast attenuation artifact), no region of scar/ischemia  . OVARIAN CYST REMOVAL    . RECTOCELE REPAIR N/A 06/29/2015   Procedure: POSTERIOR REPAIR (RECTOCELE);  Surgeon: Jonnie Kind, MD;  Location: [facility];  Service: Gynecology;  Laterality: N/A;  .  REDUCTION MAMMAPLASTY Bilateral   . SPINE SURGERY  09/29/2010   Dr. Rolena Infante  . surgical excision of 3 tumors from right thigh and right buttock  and left upper thigh  2010  . TOOTH EXTRACTION Bilateral 12/14/2014   Procedure: REMOVAL OF BILATERAL MANDIBULAR EXOSTOSES;  Surgeon: Diona Browner, DDS;  Location: [place];  Service: Oral Surgery;  Laterality: Bilateral;  . TRANSTHORACIC ECHOCARDIOGRAM  2010   EF 60-65%, mild conc LVH, grade 1 diastolic dysfunction; mildly calcified MV annulus with mildly thickened leaflets, mildly calcified MR annulus     Current Meds  Medication Sig  . ACCU-CHEK AVIVA PLUS test strip CHECK BLOOD SUGAR FOUR TIMES DAILY  . albuterol (VENTOLIN HFA) 108 (90 Base) MCG/ACT inhaler Inhale 2 puffs into the lungs every 6 (six) hours as needed for wheezing or shortness of breath.  [name] alendronate (FOSAMAX) 70 MG tablet Take 1 tablet (70 mg total) by mouth every 7 (seven) days. Take with a full glass of water on an empty stomach.  [name] amLODipine (NORVASC) 10 MG tablet TAKE 1 TABLET EVERY DAY  . ascorbic acid (VITAMIN C) 500 MG tablet Take 500 mg by mouth daily.  [name] aspirin EC 81 MG tablet Take 1 tablet (81 mg total) by mouth daily with breakfast.  .  cetirizine (ZYRTEC) 10 MG tablet Take 1 tablet (10 mg total) by mouth daily.  . clobetasol cream (TEMOVATE) [id number] % Apply 1 application topically 2 (two) times daily.   . cromolyn (NASALCROM) 5.2 MG/ACT nasal spray Place 1 spray into both nostrils 4 (four) times daily.  . diclofenac sodium (VOLTAREN) 1 % GEL APPLY 2 GRAMS  TOPICALLY 4 (FOUR) TIMES DAILY.  [name] diclofenac Sodium (VOLTAREN) 1 % GEL APPLY 2 GRAMS  TOPICALLY 4 (FOUR) TIMES DAILY.  [name] ezetimibe (ZETIA) 10 MG tablet Take 1 tablet (10 mg total) by mouth daily.  . fluticasone (FLONASE) 50 MCG/ACT nasal spray Place 2 sprays into both nostrils daily.  . hydroxychloroquine (PLAQUENIL) 200 MG tablet   . insulin aspart (NOVOLOG FLEXPEN) 100 UNIT/ML FlexPen INJECT 10 TO 14 UNITS INTO THE SKIN 3 (THREE) TIMES DAILY WITH MEALS.  [name] Insulin Glargine, 2 Unit Dial, (TOUJEO MAX SOLOSTAR) 300 UNIT/ML SOPN Inject 25 Units into the skin at bedtime.  . lamoTRIgine (LAMICTAL) 100 MG tablet TAKE 1 TABLET TWICE DAILY  . Lancets (ACCU-CHEK SOFT TOUCH) lancets Use as instructed to check blood sugar 3 times a day.  . levothyroxine (SYNTHROID) 50 MCG tablet TAKE 1 TABLET DAILY, EXCEPT TAKE 1/2 TABLET ON SUNDAY  . linaclotide (LINZESS) 145 MCG CAPS capsule Take 1 capsule (145 mcg total) by mouth daily before breakfast.  . LORazepam (ATIVAN) 0.5 MG tablet Take 1 tablet (0.5 mg total) by mouth 3 (three) times daily.  [name] losartan (COZAAR) 50 MG tablet TAKE 1 TABLET EVERY DAY  . lubiprostone (AMITIZA) 24 MCG capsule Take 1 capsule (24 mcg total) by mouth 2 (two) times daily with a meal.  . metaxalone (SKELAXIN) 800 MG tablet metaxalone 800 mg tablet  take 1 tablet by mouth 3 times daily as needed.  . metoprolol tartrate (LOPRESSOR) 50 MG tablet TAKE 1 TABLET TWICE DAILY (NEED MD APPOINTMENT)  . montelukast (SINGULAIR) 10 MG tablet TAKE 1 TABLET EVERY DAY  . NEOMYCIN-POLYMYXIN-HC, OPHTH, SUSP neomycin 3.5 mg-polymyxin 10,000 unit-hydrocort 10 mg/mL eye drop,susp  .  nystatin (MYCOSTATIN/NYSTOP) powder APPLY TO AFFECTED AREA 4 TIMES DAILY.  [name] Omega-3 Fatty Acids (FISH OIL PO) Take 1 capsule by mouth daily.  . ondansetron (ZOFRAN)  4 MG tablet Take 1 tablet (4 mg total) by mouth every 8 (eight) hours as needed for nausea or vomiting.  [name] oxyCODONE-acetaminophen (PERCOCET) 10-325 MG tablet Take 1 tablet by mouth every 8 (eight) hours as needed.   . pregabalin (LYRICA) 75 MG capsule Take 1 capsule (75 mg total) by mouth daily.  . RABEprazole (ACIPHEX) 20 MG tablet TAKE 1 TABLET TWICE DAILY  . RESTASIS 0.05 % ophthalmic emulsion Place 1 drop into both eyes 2 (two) times daily.   . risperiDONE (RISPERDAL) 0.5 MG tablet Take 1 tablet (0.5 mg total) by mouth at bedtime.  . rosuvastatin (CRESTOR) 5 MG tablet TAKE 1 TABLET AT BEDTIME  . sertraline (ZOLOFT) 100 MG tablet Take 1.5 tablets (150 mg total) by mouth daily.  [name] STIOLTO RESPIMAT 2.5-2.5 MCG/ACT AERS INHALE 2 PUFFS INTO THE LUNGS DAILY.  . traZODone (DESYREL) 100 MG tablet Take 1 tablet (100 mg total) by mouth at bedtime.     Allergies:   Cephalexin, Iron, Milk-related compounds, Penicillins, and Phenazopyridine hcl   ROS:   Please see the history of present illness.    All other systems reviewed and are negative.   Labs/Other Tests and Data Reviewed:    Recent Labs: 02/17/2019: TSH 0.94 03/25/2019: ALT 24; BUN 31; Creatinine, Ser 1.03; Hemoglobin 11.6; Platelets 278; Potassium 4.1; Sodium 139   Recent Lipid Panel Lab Results  Component Value Date/Time   CHOL 187 02/12/2019 11:50 AM   TRIG 202 (H) 02/12/2019 11:50 AM   HDL 46 02/12/2019 11:50 AM   CHOLHDL 3.5 09/30/2018 11:43 AM   LDLCALC 106 (H) 02/12/2019 11:50 AM   LDLCALC 79 09/30/2018 11:43 AM    Wt Readings from Last 3 Encounters:  06/10/19 162 lb (73.5 kg)  04/21/19 162 lb (73.5 kg)  02/17/19 169 lb (76.7 kg)     Objective:    Vital Signs:  BP 127/78   Ht 4\' 11"  (1.499 m)   Wt 162 lb (73.5 kg)   BMI 32.72 kg/m    VITAL SIGNS:   reviewed GEN:  Alert and oriented RESPIRATORY:  No shortness of breath noted in conversation PSYCH:  Normal affect and mood  ASSESSMENT & PLAN:    1. Gastroesophageal reflux disease, unspecified whether esophagitis present  - pantoprazole (PROTONIX) 40 MG tablet; Take 1 tablet (40 mg total) by mouth daily before breakfast.  Dispense: 30 tablet; Refill: 0  Time:   Today, I have spent 10 minutes with the patient with telehealth technology discussing the above problems.     Medication Adjustments/Labs and Tests Ordered: Current medicines are reviewed at length with the patient today.  Concerns regarding medicines are outlined above.   Tests Ordered: No orders of the defined types were placed in this encounter.   Medication Changes: No orders of the defined types were placed in this encounter.   Disposition:  Follow up 08/26/2019 Signed, Perlie Mayo, NP  06/10/2019 10:25 AM     [facility]

## 2019-06-10 NOTE — Assessment & Plan Note (Signed)
Needs to be seen by her GI.  Has an appointment coming up in the next couple weeks.  Some of her symptoms appear to be reflux related.  We will put her on Protonix for 2 to 4 weeks and hold her AcipHex to see if that helps.  Advised that her stomach doctor might change this back.  But it would not hurt to try.  Encouraged her on GERD diet which she reports she sticks to.  Reviewed side effects, risks and benefits of medication.   Patient acknowledged agreement and understanding of the plan.

## 2019-06-10 NOTE — Telephone Encounter (Signed)
Pt scheduled to see Dr. Hilarie Fredrickson 07/02/19@9 :30am. Pt aware of appt.

## 2019-06-11 ENCOUNTER — Ambulatory Visit (INDEPENDENT_AMBULATORY_CARE_PROVIDER_SITE_OTHER): Payer: Medicare HMO | Admitting: Psychiatry

## 2019-06-11 ENCOUNTER — Telehealth: Payer: Self-pay | Admitting: Internal Medicine

## 2019-06-11 ENCOUNTER — Encounter (HOSPITAL_COMMUNITY): Payer: Self-pay | Admitting: Psychiatry

## 2019-06-11 ENCOUNTER — Other Ambulatory Visit: Payer: Self-pay

## 2019-06-11 DIAGNOSIS — F331 Major depressive disorder, recurrent, moderate: Secondary | ICD-10-CM | POA: Diagnosis not present

## 2019-06-11 DIAGNOSIS — G4733 Obstructive sleep apnea (adult) (pediatric): Secondary | ICD-10-CM

## 2019-06-11 MED ORDER — LORAZEPAM 0.5 MG PO TABS: 0.5000 mg | ORAL_TABLET | Freq: Three times a day (TID) | ORAL | 2 refills | Status: DC

## 2019-06-11 MED ORDER — BUPROPION HCL ER (XL) 150 MG PO TB24: 150.0000 mg | ORAL_TABLET | ORAL | 2 refills | Status: DC

## 2019-06-11 MED ORDER — TRAZODONE HCL 100 MG PO TABS: 100.0000 mg | ORAL_TABLET | Freq: Every day | ORAL | 2 refills | Status: DC

## 2019-06-11 MED ORDER — SERTRALINE HCL 100 MG PO TABS: 100.0000 mg | ORAL_TABLET | Freq: Every day | ORAL | 2 refills | Status: DC

## 2019-06-11 MED ORDER — RISPERIDONE 0.5 MG PO TABS: 0.5000 mg | ORAL_TABLET | Freq: Every day | ORAL | 2 refills | Status: DC
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
## 2019-06-11 NOTE — Telephone Encounter (Signed)
Attempted to call pt but unable to reach. Left message for pt to return call. 

## 2019-06-12 ENCOUNTER — Telehealth: Payer: Self-pay

## 2019-06-12 ENCOUNTER — Inpatient Hospital Stay (HOSPITAL_COMMUNITY): Payer: Medicare HMO | Attending: Hematology

## 2019-06-12 ENCOUNTER — Other Ambulatory Visit (HOSPITAL_COMMUNITY): Payer: Self-pay | Admitting: Psychiatry

## 2019-06-12 ENCOUNTER — Other Ambulatory Visit: Payer: Self-pay

## 2019-06-12 DIAGNOSIS — J449 Chronic obstructive pulmonary disease, unspecified: Secondary | ICD-10-CM | POA: Diagnosis not present

## 2019-06-12 DIAGNOSIS — D509 Iron deficiency anemia, unspecified: Secondary | ICD-10-CM

## 2019-06-12 DIAGNOSIS — Z79899 Other long term (current) drug therapy: Secondary | ICD-10-CM | POA: Insufficient documentation

## 2019-06-12 DIAGNOSIS — I10 Essential (primary) hypertension: Secondary | ICD-10-CM | POA: Diagnosis not present

## 2019-06-12 LAB — COMPREHENSIVE METABOLIC PANEL
ALT: 18 U/L (ref 0–44)
AST: 19 U/L (ref 15–41)
Albumin: 4 g/dL (ref 3.5–5.0)
Alkaline Phosphatase: 101 U/L (ref 38–126)
Anion gap: 12 (ref 5–15)
BUN: 24 mg/dL — ABNORMAL HIGH (ref 8–23)
CO2: 23 mmol/L (ref 22–32)
Calcium: 9.2 mg/dL (ref 8.9–10.3)
Chloride: 106 mmol/L (ref 98–111)
Creatinine, Ser: 0.87 mg/dL (ref 0.44–1.00)
GFR calc Af Amer: >60 mL/min (ref >60)
GFR calc non Af Amer: >60 mL/min (ref >60)
Glucose, Bld: 203 mg/dL — ABNORMAL HIGH (ref 70–99)
Potassium: 4 mmol/L (ref 3.5–5.1)
Sodium: 141 mmol/L (ref 135–145)
Total Bilirubin: 0.5 mg/dL (ref 0.3–1.2)
Total Protein: 7.3 g/dL (ref 6.5–8.1)

## 2019-06-12 LAB — CBC WITH DIFFERENTIAL/PLATELET
Abs Immature Granulocytes: 0.05 10*3/uL (ref 0.00–0.07)
Basophils Absolute: 0 10*3/uL (ref 0.0–0.1)
Basophils Relative: 0 %
Eosinophils Absolute: 0.2 10*3/uL (ref 0.0–0.5)
Eosinophils Relative: 4 %
HCT: 40.7 % (ref 36.0–46.0)
Hemoglobin: 11.5 g/dL — ABNORMAL LOW (ref 12.0–15.0)
Immature Granulocytes: 1 %
Lymphocytes Relative: 22 %
Lymphs Abs: 1.4 10*3/uL (ref 0.7–4.0)
MCH: 22.4 pg — ABNORMAL LOW (ref 26.0–34.0)
MCHC: 28.3 g/dL — ABNORMAL LOW (ref 30.0–36.0)
MCV: 79.3 fL — ABNORMAL LOW (ref 80.0–100.0)
Monocytes Absolute: 0.7 10*3/uL (ref 0.1–1.0)
Monocytes Relative: 10 %
Neutro Abs: 4.2 10*3/uL (ref 1.7–7.7)
Neutrophils Relative %: 63 %
Platelets: 216 10*3/uL (ref 150–400)
RBC: 5.13 MIL/uL — ABNORMAL HIGH (ref 3.87–5.11)
RDW: 14.4 % (ref 11.5–15.5)
WBC: 6.5 10*3/uL (ref 4.0–10.5)
nRBC: 0 % (ref 0.0–0.2)

## 2019-06-12 LAB — VITAMIN D 25 HYDROXY (VIT D DEFICIENCY, FRACTURES): Vit D, 25-Hydroxy: 48.11 ng/mL (ref 30–100)

## 2019-06-12 LAB — VITAMIN B12: Vitamin B-12: 3690 pg/mL — ABNORMAL HIGH (ref 180–914)

## 2019-06-12 LAB — IRON AND TIBC
Iron: 61 ug/dL (ref 28–170)
Saturation Ratios: 17 % (ref 10.4–31.8)
TIBC: 356 ug/dL (ref 250–450)
UIBC: 295 ug/dL

## 2019-06-12 LAB — FOLATE: Folate: 14 ng/mL

## 2019-06-12 LAB — FERRITIN: Ferritin: 212 ng/mL (ref 11–307)

## 2019-06-12 LAB — LACTATE DEHYDROGENASE: LDH: 169 U/L (ref 98–192)

## 2019-06-12 NOTE — Telephone Encounter (Signed)
LMTCB x2 for pt 

## 2019-06-12 NOTE — Telephone Encounter (Signed)
Pt called back. Please return call.  

## 2019-06-12 NOTE — Telephone Encounter (Signed)
Pt returning call.  [phone]

## 2019-06-12 NOTE — Telephone Encounter (Signed)
ATC Patient.  LM for Patient to call back. 

## 2019-06-12 NOTE — Telephone Encounter (Signed)
Never take Protonix or synthroid at the same time or with other medications. Each need to be taken 1-2 hours from other meds include each other.  I would rather start the patches if she is willing given the other medications and risk of side effects.  With gratitude, Jarrett Soho

## 2019-06-12 NOTE — Telephone Encounter (Signed)
Wants to know if she can get zyban to quit smoking. Also wants to know if she can take protonix with synthroid.

## 2019-06-13 DIAGNOSIS — R32 Unspecified urinary incontinence: Secondary | ICD-10-CM | POA: Diagnosis not present

## 2019-06-16 ENCOUNTER — Other Ambulatory Visit: Payer: Self-pay | Admitting: *Deleted

## 2019-06-16 DIAGNOSIS — Z9181 History of falling: Secondary | ICD-10-CM | POA: Diagnosis not present

## 2019-06-16 DIAGNOSIS — I10 Essential (primary) hypertension: Secondary | ICD-10-CM | POA: Diagnosis not present

## 2019-06-16 DIAGNOSIS — M179 Osteoarthritis of knee, unspecified: Secondary | ICD-10-CM | POA: Diagnosis not present

## 2019-06-16 NOTE — Telephone Encounter (Signed)
LMTCB x1 for pt.  

## 2019-06-16 NOTE — Patient Outreach (Signed)
[facility]) Care Management  06/16/2019  [name] 1950-12-08 [id number]   CSW made an attempt to try and contact patient today to follow-up regarding social work services and resources, as well as to try and assist patient with completion of Advanced Directives ([place] documents); however, patient was unavailable at the time of CSW's call.  CSW left a HIPAA compliant message for patient on voicemail and is currently awaiting a return call.  CSW will make a second outreach attempt within the next 3-4 business days, if a return call is not received from patient in the meantime.    [name], BSW, MSW, LCSW  Licensed Education officer, [facility]  Mailing Cecilia [address], [place], [place] [address] Physical Address-[address], [place], [place] [address] Toll Free Main # [phone] Fax # [phone] Cell # [phone]  Office # [phone] [name]

## 2019-06-17 ENCOUNTER — Ambulatory Visit (HOSPITAL_COMMUNITY): Payer: Medicare HMO | Admitting: Psychiatry

## 2019-06-17 DIAGNOSIS — I10 Essential (primary) hypertension: Secondary | ICD-10-CM

## 2019-06-17 DIAGNOSIS — E1165 Type 2 diabetes mellitus with hyperglycemia: Secondary | ICD-10-CM | POA: Diagnosis not present

## 2019-06-17 DIAGNOSIS — J42 Unspecified chronic bronchitis: Secondary | ICD-10-CM | POA: Diagnosis not present

## 2019-06-17 DIAGNOSIS — E1142 Type 2 diabetes mellitus with diabetic polyneuropathy: Secondary | ICD-10-CM | POA: Diagnosis not present

## 2019-06-17 DIAGNOSIS — J449 Chronic obstructive pulmonary disease, unspecified: Secondary | ICD-10-CM | POA: Diagnosis not present

## 2019-06-17 DIAGNOSIS — F17210 Nicotine dependence, cigarettes, uncomplicated: Secondary | ICD-10-CM | POA: Diagnosis not present

## 2019-06-17 DIAGNOSIS — M160 Bilateral primary osteoarthritis of hip: Secondary | ICD-10-CM | POA: Diagnosis not present

## 2019-06-17 DIAGNOSIS — F250 Schizoaffective disorder, bipolar type: Secondary | ICD-10-CM | POA: Diagnosis not present

## 2019-06-17 DIAGNOSIS — D649 Anemia, unspecified: Secondary | ICD-10-CM | POA: Diagnosis not present

## 2019-06-17 DIAGNOSIS — E039 Hypothyroidism, unspecified: Secondary | ICD-10-CM | POA: Diagnosis not present

## 2019-06-17 NOTE — Telephone Encounter (Signed)
Spoke with pt. She is needing a new CPAP mask. Pt has a pending OV with [facility] later this month. Order has been placed. Nothing further needed.

## 2019-06-17 NOTE — Telephone Encounter (Signed)
Patient aware- Dr Harrington Challenger called in the zyban

## 2019-06-18 ENCOUNTER — Inpatient Hospital Stay (HOSPITAL_BASED_OUTPATIENT_CLINIC_OR_DEPARTMENT_OTHER): Payer: Medicare HMO | Admitting: Nurse Practitioner

## 2019-06-18 ENCOUNTER — Other Ambulatory Visit: Payer: Self-pay | Admitting: Physician Assistant

## 2019-06-18 DIAGNOSIS — D509 Iron deficiency anemia, unspecified: Secondary | ICD-10-CM

## 2019-06-18 NOTE — Progress Notes (Signed)
[facility], [place] [address]   CLINIC:  Medical Oncology/Hematology  PCP:  [name], MD [address] [place] [address] [phone]   REASON FOR VISIT: Follow-up for iron deficiency anemia.  CURRENT THERAPY: Intermittent iron infusions   INTERVAL HISTORY:  [name] 69 y.o. female was called for a telephone visit today for her iron deficiency anemia.  Patient reports she is more fatigued than normal.  She reports the iron infusions did help her with her fatigue last time.  She denies any bright red bleeding per rectum or melena.  She denies any easy bruising or bleeding. Denies any nausea, vomiting, or diarrhea. Denies any new pains. Had not noticed any recent bleeding such as epistaxis, hematuria or hematochezia. Denies recent chest pain on exertion, shortness of breath on minimal exertion, pre-syncopal episodes, or palpitations. Denies any numbness or tingling in hands or feet. Denies any recent fevers, infections, or recent hospitalizations. Patient reports appetite at 75% and energy level at 50%.     REVIEW OF SYSTEMS:  Review of Systems  Constitutional: Positive for fatigue.  All other systems reviewed and are negative.    PAST MEDICAL/SURGICAL HISTORY:  Past Medical History:  Diagnosis Date  . Allergy   . Anemia   . Anxiety    takes Ativan daily  . Arthritis   . Assistance needed for mobility   . Bipolar disorder ([place])    takes Risperdal nightly  . Blood transfusion   . Cancer ([place])    In her gum  . Carpal tunnel syndrome of right wrist 05/23/2011  . Cervical disc disorder with radiculopathy of cervical region 10/31/2012  . Chronic back pain   . Chronic idiopathic constipation   . Chronic neck and back pain   . Colon polyps   . COPD (chronic obstructive pulmonary disease) with chronic bronchitis ([place]) 09/16/2013   Office Spirometry 10/30/2013-submaximal effort based on appearance of loop and curve.  Numbers would fit with severe restriction but her physiologic capability may be better than this. FVC 0.91/44%, and 10.74/45%, FEV1/FVC 0.81, FEF 25-75% 1.43/69%    . Diabetes mellitus    Type II  . Diverticulosis    TCS 9/08 by Dr. Delfin Edis for diarrhea . Bx for micro scopic colitis negative.   . Fibromyalgia   . Frequent falls   . GERD (gastroesophageal reflux disease)    takes Aciphex daily  . Glaucoma    eye drops daily  . Gum symptoms    infection on antibiotic  . Hemiplegia affecting non-dominant side, post-stroke 08/02/2011  . Hiatal hernia   . Hyperlipidemia    takes Crestor daily  . Hypertension    takes Amlodipine,Metoprolol,and Clonidine daily  . Hypothyroidism    takes Synthroid daily  . IBS (irritable bowel syndrome)   . Insomnia    takes Trazodone nightly  . Major depression, recurrent ([place])    takes Zoloft daily  . 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 Metabolic encephalopathy [id number]  . Migraines    chronic headaches  . Mononeuritis lower limb   . Narcolepsy   . Osteoporosis   . Pancreatitis 2006   due to Depakote with normal EUS   . Schatzki's ring    non critical / EGD with ED 8/2011with RMR  . Seizures ([place])    takes Lamictal daily.Last seizure 3 yrs ago  . Sleep apnea    on CPAP  . Small bowel obstruction ([place])   .  Stroke [facility])    left sided weakness, speech changes  . Tubular adenoma of colon    Past Surgical History:  Procedure Laterality Date  . ABDOMINAL HYSTERECTOMY  1978  . BACK SURGERY  July 2012  . BACTERIAL OVERGROWTH TEST N/A 05/05/2013   Procedure: BACTERIAL OVERGROWTH TEST;  Surgeon: Daneil Dolin, MD;  [facility];  Service: Endoscopy;  Laterality: N/A;  7:30  . BIOPSY THYROID  2009  . BRAIN SURGERY  11/2011   resection of meningioma  . BREAST REDUCTION SURGERY  1994  . CARDIAC CATHETERIZATION  05/10/2005   normal coronaries, normal LV systolic function and EF (Dr. Jackie Plum)  . CARPAL TUNNEL RELEASE Left 07/22/04    Dr. Aline Brochure  . CATARACT EXTRACTION Bilateral   . CHOLECYSTECTOMY  1984  . COLONOSCOPY N/A 09/25/2012   [id number] diverticulosis.  colonic polyp-removed : tubular adenoma  . CRANIOTOMY  11/23/2011   Procedure: CRANIOTOMY TUMOR EXCISION;  Surgeon: Hosie Spangle, MD;  Location: [facility] NEURO ORS;  Service: Neurosurgery;  Laterality: N/A;  Craniotomy for tumor resection  . ESOPHAGOGASTRODUODENOSCOPY  12/29/2010   Rourk-Retained food in the esophagus and stomach, small hiatal hernia, status post Maloney dilation of the esophagus  . ESOPHAGOGASTRODUODENOSCOPY N/A 09/25/2012   [id number] atonic baggy esophagus status post Maloney dilation 62 F. Hiatal hernia  . GIVENS CAPSULE STUDY N/A 01/15/2013   NORMAL.   . IR GENERIC HISTORICAL  03/17/2016   IR RADIOLOGIST EVAL & MGMT 03/17/2016 [facility]-INTERV RAD  . LESION REMOVAL N/A 05/31/2015   Procedure: REMOVAL RIGHT AND LEFT LESIONS OF MANDIBLE;  Surgeon: Diona Browner, DDS;  Location: [facility];  Service: Oral Surgery;  Laterality: N/A;  . MALONEY DILATION  12/29/2010   RMR;  . NM MYOCAR PERF WALL MOTION  2006   "relavtiely normal" persantine, mild anterior thinning (breast attenuation artifact), no region of scar/ischemia  . OVARIAN CYST REMOVAL    . RECTOCELE REPAIR N/A 06/29/2015   Procedure: POSTERIOR REPAIR (RECTOCELE);  Surgeon: Jonnie Kind, MD;  Location: [facility] OR[facility];  Service: Gynecology;  Laterality: N/A;  . REDUCTION MAMMAPLASTY Bilateral   . SPINE SURGERY  09/29/2010   Dr. Rolena Infante  . surgical excision of 3 tumors from right thigh and right buttock  and left upper thigh  2010  . TOOTH EXTRACTION Bilateral 12/14/2014   Procedure: REMOVAL OF BILATERAL MANDIBULAR EXOSTOSES;  Surgeon: Diona Browner, DDS;  Location: [facility];  Service: Oral Surgery;  Laterality: Bilateral;  . TRANSTHORACIC ECHOCARDIOGRAM  2010   EF 60-65%, mild conc LVH, grade 1 diastolic dysfunction; mildly calcified MV annulus with mildly thickened leaflets, mildly calcified MR annulus      SOCIAL HISTORY:  Social History   Socioeconomic History  . Marital status: Divorced    Spouse name: Not on file  . Number of children: 1  . Years of education: 83  . Highest education level: High school graduate  Occupational History  . Occupation: Disabled  Tobacco Use  . Smoking status: Current Every Day Smoker    Packs/day: 0.50    Years: 30.00    Pack years: 15.00    Types: Cigarettes  . Smokeless tobacco: Never Used  . Tobacco comment: 5-10 cigs a day  Substance and Sexual Activity  . Alcohol use: No    Alcohol/week: 0.0 standard drinks    Comment:    . Drug use: No  . Sexual activity: Not Currently  Other Topics Concern  . Not on file  Social History Narrative  01/29/18 Lives alone, has 3 aides, Mon- Fri 8 hrs, 2 hrs on Sat-Sun, RN manages her meds   Caffeine use: Drink coffee sometimes    Right handed    Social Determinants of Health   Financial Resource Strain: Low Risk   . Difficulty of Paying Living Expenses: Not very hard  Food Insecurity: No Food Insecurity  . Worried About Charity fundraiser in the Last Year: Never true  . Ran Out of Food in the Last Year: Never true  Transportation Needs: No Transportation Needs  . Lack of Transportation (Medical): No  . Lack of Transportation (Non-Medical): No  Physical Activity: Inactive  . Days of Exercise per Week: 0 days  . Minutes of Exercise per Session: 0 min  Stress: Stress Concern Present  . Feeling of Stress : To some extent  Social Connections: Somewhat Isolated  . Frequency of Communication with Friends and Family: More than three times a week  . Frequency of Social Gatherings with Friends and Family: More than three times a week  . Attends Religious Services: More than 4 times per year  . Active Member of Clubs or Organizations: No  . Attends Archivist Meetings: Never  . Marital Status: Divorced  Human resources officer Violence: Not At Risk  . Fear of Current or Ex-Partner: No  . Emotionally  Abused: No  . Physically Abused: No  . Sexually Abused: No    FAMILY HISTORY:  Family History  Problem Relation Age of Onset  . Heart attack Mother        HTN  . Pneumonia Father   . Kidney failure Father   . Diabetes Father   . Pancreatic cancer Sister   . Diabetes Brother   . Hypertension Brother   . Diabetes Brother   . Cancer Sister        breast   . Hypertension Son   . Sleep apnea Son   . Cancer Sister        pancreatic  . Stroke Maternal Grandmother   . Heart attack Maternal Grandfather   . Alcohol abuse Maternal Uncle   . Colon cancer Neg Hx   . Anesthesia problems Neg Hx   . Hypotension Neg Hx   . Malignant hyperthermia Neg Hx   . Pseudochol deficiency Neg Hx   . Breast cancer Neg Hx     CURRENT MEDICATIONS:  Outpatient Encounter Medications as of 06/18/2019  Medication Sig  . ACCU-CHEK AVIVA PLUS test strip CHECK BLOOD SUGAR FOUR TIMES DAILY  . albuterol (VENTOLIN HFA) 108 (90 Base) MCG/ACT inhaler Inhale 2 puffs into the lungs every 6 (six) hours as needed for wheezing or shortness of breath.  [name] alendronate (FOSAMAX) 70 MG tablet Take 1 tablet (70 mg total) by mouth every 7 (seven) days. Take with a full glass of water on an empty stomach.  [name] amLODipine (NORVASC) 10 MG tablet TAKE 1 TABLET EVERY DAY  . ascorbic acid (VITAMIN C) 500 MG tablet Take 500 mg by mouth daily.  [name] aspirin EC 81 MG tablet Take 1 tablet (81 mg total) by mouth daily with breakfast.  . buPROPion (WELLBUTRIN XL) 150 MG 24 hr tablet Take 1 tablet (150 mg total) by mouth every morning.  . cetirizine (ZYRTEC) 10 MG tablet Take 1 tablet (10 mg total) by mouth daily.  . clobetasol cream (TEMOVATE) [id number] % Apply 1 application topically 2 (two) times daily.   . cromolyn (NASALCROM) 5.2 MG/ACT nasal spray Place 1 spray into both  nostrils 4 (four) times daily.  . diclofenac sodium (VOLTAREN) 1 % GEL APPLY 2 GRAMS  TOPICALLY 4 (FOUR) TIMES DAILY.  [name] diclofenac Sodium (VOLTAREN) 1 % GEL APPLY 2 GRAMS   TOPICALLY 4 (FOUR) TIMES DAILY.  [name] ezetimibe (ZETIA) 10 MG tablet Take 1 tablet (10 mg total) by mouth daily.  . fluticasone (FLONASE) 50 MCG/ACT nasal spray Place 2 sprays into both nostrils daily.  . hydroxychloroquine (PLAQUENIL) 200 MG tablet   . insulin aspart (NOVOLOG FLEXPEN) 100 UNIT/ML FlexPen INJECT 10 TO 14 UNITS INTO THE SKIN 3 (THREE) TIMES DAILY WITH MEALS.  [name] Insulin Glargine, 2 Unit Dial, (TOUJEO MAX SOLOSTAR) 300 UNIT/ML SOPN Inject 25 Units into the skin at bedtime.  . lamoTRIgine (LAMICTAL) 100 MG tablet TAKE 1 TABLET TWICE DAILY  . Lancets (ACCU-CHEK SOFT TOUCH) lancets Use as instructed to check blood sugar 3 times a day.  . levothyroxine (SYNTHROID) 50 MCG tablet TAKE 1 TABLET DAILY, EXCEPT TAKE 1/2 TABLET ON SUNDAY  . linaclotide (LINZESS) 145 MCG CAPS capsule Take 1 capsule (145 mcg total) by mouth daily before breakfast.  . LORazepam (ATIVAN) 0.5 MG tablet Take 1 tablet (0.5 mg total) by mouth 3 (three) times daily.  [name] losartan (COZAAR) 50 MG tablet TAKE 1 TABLET EVERY DAY  . lubiprostone (AMITIZA) 24 MCG capsule Take 1 capsule (24 mcg total) by mouth 2 (two) times daily with a meal.  . metaxalone (SKELAXIN) 800 MG tablet metaxalone 800 mg tablet  take 1 tablet by mouth 3 times daily as needed.  . metoprolol tartrate (LOPRESSOR) 50 MG tablet TAKE 1 TABLET TWICE DAILY (NEED MD APPOINTMENT)  . montelukast (SINGULAIR) 10 MG tablet TAKE 1 TABLET EVERY DAY  . NEOMYCIN-POLYMYXIN-HC, OPHTH, SUSP neomycin 3.5 mg-polymyxin 10,000 unit-hydrocort 10 mg/mL eye drop,susp  . nystatin (MYCOSTATIN/NYSTOP) powder APPLY TO AFFECTED AREA 4 TIMES DAILY.  [name] Omega-3 Fatty Acids (FISH OIL PO) Take 1 capsule by mouth daily.  . ondansetron (ZOFRAN) 4 MG tablet Take 1 tablet (4 mg total) by mouth every 8 (eight) hours as needed for nausea or vomiting.  [name] oxyCODONE-acetaminophen (PERCOCET) 10-325 MG tablet Take 1 tablet by mouth every 8 (eight) hours as needed.   . pantoprazole (PROTONIX) 40 MG  tablet Take 1 tablet (40 mg total) by mouth daily before breakfast.  . pregabalin (LYRICA) 75 MG capsule Take 1 capsule (75 mg total) by mouth daily.  . RABEprazole (ACIPHEX) 20 MG tablet TAKE 1 TABLET TWICE DAILY  . RESTASIS 0.05 % ophthalmic emulsion Place 1 drop into both eyes 2 (two) times daily.   . risperiDONE (RISPERDAL) 0.5 MG tablet Take 1 tablet (0.5 mg total) by mouth at bedtime.  . rosuvastatin (CRESTOR) 5 MG tablet TAKE 1 TABLET AT BEDTIME  . sertraline (ZOLOFT) 100 MG tablet Take 1 tablet (100 mg total) by mouth daily.  [name] SHINGRIX injection   . STIOLTO RESPIMAT 2.5-2.5 MCG/ACT AERS INHALE 2 PUFFS INTO THE LUNGS DAILY.  . traZODone (DESYREL) 100 MG tablet Take 1 tablet (100 mg total) by mouth at bedtime.   Facility-Administered Encounter Medications as of 06/18/2019  Medication  . 0.9 %  sodium chloride infusion    ALLERGIES:  Allergies  Allergen Reactions  . Cephalexin Hives  . Iron Nausea And Vomiting  . Milk-Related Compounds Other (See Comments)    Doesn't agree with stomach.   . Penicillins Hives    Has patient had a PCN reaction causing immediate rash, facial/tongue/throat swelling, SOB or lightheadedness with hypotension: Yes Has patient  had a PCN reaction causing severe rash involving mucus membranes or skin necrosis: No Has patient had a PCN reaction that required hospitalization No Has patient had a PCN reaction occurring within the last 10 years: No If all of the above answers are "NO", then may proceed with Cephalosporin use.   [name] Phenazopyridine Hcl Hives          Vital signs: -Deferred due to telephone visit  Physical assessment: -Deferred due to telephone visit -Patient was alert and oriented over the phone and in no acute distress.  LABORATORY DATA:  I have reviewed the labs as listed.  CBC    Component Value Date/Time   WBC 6.5 06/12/2019 1221   RBC 5.13 (H) 06/12/2019 1221   HGB 11.5 (L) 06/12/2019 1221   HGB 10.3 (L) 01/29/2018 1315   HCT  40.7 06/12/2019 1221   HCT 33.8 (L) 01/29/2018 1315   PLT 216 06/12/2019 1221   PLT 275 01/29/2018 1315   MCV 79.3 (L) 06/12/2019 1221   MCV 75 (L) 01/29/2018 1315   MCH 22.4 (L) 06/12/2019 1221   MCHC 28.3 (L) 06/12/2019 1221   RDW 14.4 06/12/2019 1221   RDW 12.8 01/29/2018 1315   LYMPHSABS 1.4 06/12/2019 1221   LYMPHSABS 2.4 01/29/2018 1315   MONOABS 0.7 06/12/2019 1221   EOSABS 0.2 06/12/2019 1221   EOSABS 0.2 01/29/2018 1315   BASOSABS 0.0 06/12/2019 1221   BASOSABS 0.0 01/29/2018 1315   CMP Latest Ref Rng & Units 06/12/2019 03/25/2019 02/12/2019  Glucose 70 - 99 mg/dL 203(H) 163(H) 167(H)  BUN 8 - 23 mg/dL 24(H) 31(H) 32(H)  Creatinine 0.44 - 1.00 mg/dL 0.87 1.03(H) 1.00  Sodium 135 - 145 mmol/L 141 139 146(H)  Potassium 3.5 - 5.1 mmol/L 4.0 4.1 4.3  Chloride 98 - 111 mmol/L 106 107 108(H)  CO2 22 - 32 mmol/L 23 25 21   Calcium 8.9 - 10.3 mg/dL 9.2 9.3 9.5  Total Protein 6.5 - 8.1 g/dL 7.3 7.9 6.8  Total Bilirubin 0.3 - 1.2 mg/dL 0.5 0.6 0.2  Alkaline Phos 38 - 126 U/L 101 116 129(H)  AST 15 - 41 U/L 19 18 17   ALT 0 - 44 U/L 18 24 15    All questions were answered to patient's stated satisfaction. Encouraged patient to call with any new concerns or questions before his next visit to the cancer center and we can certain see him sooner, if needed.     ASSESSMENT & PLAN:   Microcytic anemia 1.  Microcytic anemia: -This is a combination anemia from iron deficiency and chronic kidney disease. -Colonoscopy on 02/10/2016 showing 2 sessile polyps in the descending colon, diverticula in the sigmoid colon. -EGD done on 02/10/2016 showing mild to moderate esophagitis, erythema in the inferior gastric antrum. - She was placed on iron pills, but could not tolerate them due to constipation. -She denies any bright red bleeding per rectum or melena. - She last received IV Feraheme x2 doses in 12/31/2018 and 01/10/2019.  She did experience some nausea and itching after her last treatment  the premedication helps this. -Labs done on 06/12/2019 showed hemoglobin 11.5, ferritin 212, percent saturation 17, platelets 216. -She reports extreme fatigue.  We will give her 2 iron infusions to help improve this. -She will follow-up in 4 months with repeat labs.      Orders placed this encounter:  Orders Placed This Encounter  Procedures  . Lactate dehydrogenase  . CBC with Differential/Platelet  . Comprehensive metabolic panel  . Ferritin  .  Iron and TIBC  . Vitamin B12  . VITAMIN D 25 Hydroxy (Vit-D Deficiency, Fractures)  . Folate    I provided 30 minutes of non face-to-face telephone visit time during this encounter, and > 50% was spent counseling as documented under my assessment & plan.  Francene Finders, FNP-C [facility] [phone]

## 2019-06-18 NOTE — Assessment & Plan Note (Addendum)
1.  Microcytic anemia: -This is a combination anemia from iron deficiency and chronic kidney disease. -Colonoscopy on 02/10/2016 showing 2 sessile polyps in the descending colon, diverticula in the sigmoid colon. -EGD done on 02/10/2016 showing mild to moderate esophagitis, erythema in the inferior gastric antrum. - She was placed on iron pills, but could not tolerate them due to constipation. -She denies any bright red bleeding per rectum or melena. - She last received IV Feraheme x2 doses in 12/31/2018 and 01/10/2019.  She did experience some nausea and itching after her last treatment the premedication helps this. -Labs done on 06/12/2019 showed hemoglobin 11.5, ferritin 212, percent saturation 17, platelets 216. -She reports extreme fatigue.  We will give her 2 iron infusions to help improve this. -She will follow-up in 4 months with repeat labs.

## 2019-06-20 ENCOUNTER — Other Ambulatory Visit: Payer: Self-pay

## 2019-06-20 ENCOUNTER — Other Ambulatory Visit: Payer: Self-pay | Admitting: *Deleted

## 2019-06-20 DIAGNOSIS — G4733 Obstructive sleep apnea (adult) (pediatric): Secondary | ICD-10-CM | POA: Diagnosis not present

## 2019-06-20 NOTE — Patient Outreach (Signed)
[facility]) Care Management  06/20/2019  Akhia Carras Hunsucker 12/19/1950 [id number]   CSW was able to make contact with patient today to follow-up regarding social work services and resources, as well as to try and assist patient with completion of her Advanced Directives ([place] documents).  Patient reported that she was still trying to wake up at the time of CSW's call, requesting to return CSW's call at 3:00pm today (Friday, June 20, 2019) to receive assistance with document completion.  CSW voiced understanding, confirming that patient has the correct contact information for CSW.  CSW explained to patient that CSW will await a return call.  If CSW does not receive a return call from patient today, CSW will make arrangements to contact patient again next week, on Friday, June 27, 2019, around [place], BSW, MSW, [employer]  Licensed Clinical Social Worker  [place]  Mailing [place] [address], [place], [place] [address] Physical Address-[address], [place], [place] [address] Toll Free Main # [phone] Fax # [phone] Cell # [phone]  Office # [phone] [name]

## 2019-06-23 ENCOUNTER — Encounter: Payer: Self-pay | Admitting: *Deleted

## 2019-06-23 ENCOUNTER — Other Ambulatory Visit: Payer: Self-pay | Admitting: *Deleted

## 2019-06-23 DIAGNOSIS — M160 Bilateral primary osteoarthritis of hip: Secondary | ICD-10-CM | POA: Diagnosis not present

## 2019-06-23 DIAGNOSIS — F17210 Nicotine dependence, cigarettes, uncomplicated: Secondary | ICD-10-CM | POA: Diagnosis not present

## 2019-06-23 DIAGNOSIS — E039 Hypothyroidism, unspecified: Secondary | ICD-10-CM | POA: Diagnosis not present

## 2019-06-23 DIAGNOSIS — D649 Anemia, unspecified: Secondary | ICD-10-CM | POA: Diagnosis not present

## 2019-06-23 DIAGNOSIS — J42 Unspecified chronic bronchitis: Secondary | ICD-10-CM | POA: Diagnosis not present

## 2019-06-23 DIAGNOSIS — J449 Chronic obstructive pulmonary disease, unspecified: Secondary | ICD-10-CM | POA: Diagnosis not present

## 2019-06-23 DIAGNOSIS — F250 Schizoaffective disorder, bipolar type: Secondary | ICD-10-CM | POA: Diagnosis not present

## 2019-06-23 DIAGNOSIS — E1165 Type 2 diabetes mellitus with hyperglycemia: Secondary | ICD-10-CM | POA: Diagnosis not present

## 2019-06-23 DIAGNOSIS — E1142 Type 2 diabetes mellitus with diabetic polyneuropathy: Secondary | ICD-10-CM | POA: Diagnosis not present

## 2019-06-23 NOTE — Patient Outreach (Signed)
[facility]) Care Management  06/23/2019  [name] 1950/09/12 [id number]   CSW received a return call from patient today, in response to the brief conversation that [place] had with patient on Friday, June 20, 2019, regarding completion of her Advanced Directives ([place] documents).  CSW spent 42 minutes on the phone with patient, going over every line in the documents, explaining what each question meant, to ensure understanding.  CSW encouraged patient to take her completed documents to any [facility] affiliated facility to have them witnessed and notarized.  CSW provided patient with a list of [facility] facilities that offer notary services.  Patient was encouraged to keep the original documents but make copies for her physicians to scan into her EMR (Electronic Medical Record) in Epic.  CSW will perform a case closure on patient, as all goals of treatment have been met from social work standpoint and no additional social work needs have been identified at this time.  CSW will notify patient's Telephonic RNCM with [facility], Jon Billings of CSW's plans to close patient's case.  CSW will fax an update to patient's Primary Care Physician, Dr. Tula Nakayama to ensure that they she is aware of CSW's involvement with patient's plan of care.  CSW was able to confirm that patient has the correct contact information for CSW, encouraging patient to contact CSW if additional social work needs arise in the near future or if patient needs further assistance with completion of Advanced Directives.  [name], BSW, MSW, LCSW  Licensed Education officer, [facility]  Mailing [place] [address] [place], [place] [address] Physical Address-[address], [place], [place] [address] Toll Free Main # [phone] Fax # [phone] Cell # [phone]  Office #  [phone] [name]

## 2019-06-24 ENCOUNTER — Other Ambulatory Visit: Payer: Self-pay

## 2019-06-24 ENCOUNTER — Encounter: Payer: Self-pay | Admitting: Internal Medicine

## 2019-06-24 ENCOUNTER — Ambulatory Visit (INDEPENDENT_AMBULATORY_CARE_PROVIDER_SITE_OTHER): Payer: Medicare HMO | Admitting: Psychiatry

## 2019-06-24 ENCOUNTER — Ambulatory Visit (INDEPENDENT_AMBULATORY_CARE_PROVIDER_SITE_OTHER): Payer: Medicare HMO | Admitting: Internal Medicine

## 2019-06-24 VITALS — BP 160/80 | HR 81 | Ht 59.0 in | Wt 162.0 lb

## 2019-06-24 DIAGNOSIS — Z794 Long term (current) use of insulin: Secondary | ICD-10-CM | POA: Diagnosis not present

## 2019-06-24 DIAGNOSIS — E1165 Type 2 diabetes mellitus with hyperglycemia: Secondary | ICD-10-CM | POA: Diagnosis not present

## 2019-06-24 DIAGNOSIS — F331 Major depressive disorder, recurrent, moderate: Secondary | ICD-10-CM

## 2019-06-24 DIAGNOSIS — E042 Nontoxic multinodular goiter: Secondary | ICD-10-CM

## 2019-06-24 DIAGNOSIS — E039 Hypothyroidism, unspecified: Secondary | ICD-10-CM

## 2019-06-24 LAB — POCT GLYCOSYLATED HEMOGLOBIN (HGB A1C): Hemoglobin A1C: 6.4 % — AB (ref 4.0–5.6)

## 2019-06-24 MED ORDER — FREESTYLE LIBRE 14 DAY SENSOR MISC: 1.0000 | 11 refills | Status: DC

## 2019-06-24 MED ORDER — FREESTYLE LIBRE 14 DAY READER DEVI: 1.0000 | Freq: Once | 0 refills | Status: AC

## 2019-06-24 NOTE — Progress Notes (Signed)
Virtual Visit via Telephone Note  I connected with [name] on 06/24/19 at 4:15 PM EDT by telephone and verified that I am speaking with the correct person using two identifiers.   I discussed the limitations, risks, security and privacy concerns of performing an evaluation and management service by telephone and the availability of in person appointments. I also discussed with the patient that there may be a patient responsible charge related to this service. The patient expressed understanding and agreed to proceed.   I provided 30 minutes of non-face-to-face time during this encounter.   [name], LCSW             THERAPIST PROGRESS NOTE  Session Time: Tuesday 06/24/2019 4:15 PM - 4:45  PM   Participation Level: Active  Behavioral Response: lethargic, depressed, anxious  Type of Therapy: Individual Therapy  Treatment Goals addressed: Learn and implement behavioral strategies to overcome depression.                                                       Verbalize an understanding and resolution of current interpersonal problems.   Interventions: CBT and Supportive   Summary: [name] is a 69 y.o. female who presents with  long standing history of recurrent periods  of depression beginning when she was 30 and her favorite uncle died. Patient reports multiple psychiatric hospitlaizations due to depression and suicidal ideations with the last one occuring in 1997. Patient has participated in outpatient psychotherapy and medication management intermittently since age 45. She currently is seeing psychiatrist Dr. Harrington Challenger . Prior to this, she was seen at [facility]. Patient also has had ECT at [facility]. Symptoms have worsened in recent months due to family stress and have  included depressed mood, anxiety, excessive worry, and tearfulness.         Patient's last contact was by virtual visit via telephone about  3- 4 weeks ago. She continues to experience moderate symptoms of  depression including irritability and anger.  She continues to express frustration regarding her aide.  However she is pleased she was assertive and expressed her concerns to her aide's supervisor.  However, she expresses disappointment the supervisor told her she and they would have to work other problems.  Patient reports trying to be more assertive with aide  but still sometimes being aggressive in her conversation. Patient is happy today to be celebrating her 69th birthday and reports receiving well wishes from family and friends most of the day.  She reports she has had some down days but has continued to use her spirituality and music.   Suicidal/Homicidal: Nowithout intent/plan  Therapist Response: Reviewed symptoms, discussed stressors, facilitated expression of thoughts and feelings, validated feelings, praised and reinforced patient's use of assertiveness skills/ spirituality to help cope, discussed with patient how she could use spirituality to cope with interaction with aide  ,  Plan: Return again in 2 weeks.  Diagnosis: Axis I: MDD, Recurrent, moderate    Axis II: Deferred    Elease Swarm E Heli Dino, LCSW

## 2019-06-24 NOTE — Progress Notes (Signed)
Patient ID: [name], female   DOB: 02-May-1950, 69 y.o.   MRN: [id number]  This visit occurred during the SARS-CoV-2 public health emergency.  Safety protocols were in place, including screening questions prior to the visit, additional usage of staff PPE, and extensive cleaning of exam room while observing appropriate contact time as indicated for disinfecting solutions.   HPI: [name] is a 39 y.o.-year-old female, initially referred by her PCP, [name], presenting for follow-up for DM2, dx 1995, insulin-dependent since 1997, uncontrolled, with complications (gastroparesis, cerebrovasc. Ds-  H/o stroke, PN),  Hypothyroidism, thyroid nodules.  Last visit 4 mo ago.  She is on steroid injections in the shoulder- in 05/2019. Also, on Prednisone earlier in the year.   DM2: Revieed HbA1c levels:: Lab Results  Component Value Date   HGBA1C 5.9 (H) 02/12/2019   HGBA1C 5.4 09/30/2018   HGBA1C 5.1 02/18/2018  10/28/2013: HbA1c 8.5%  She is on: - Toujeo 23 >> 25 units at bedtime - Novolog: 10-14 units before a meal Could not tolerate Metformin >> nausea.   Insulin doses were decreased in 04/2017 due to low blood sugars in the 40s.  Pt checks her sugars 4x times a day (reviewed her log): - am: 91, 124, 160-177, 215 >> 84-144, 230 >> 196 >> 61, 78-188, 218 - 2h after b'fast: n/c >> 110 >> n/c - before lunch:  123-149, 174, 198 >> n/c >> 91-155, 256, 348 - 2h after lunch:  115 >> n/c >> 127, 215, 241 >> n/c - before dinner:89, 123-198, 222, 234 >> 149-289 >> 109-209, 303, 422 - 2h after dinner: n/c - bedtime:  146-219, 240, 249 >>169-244 >> 130-202, 236 - nighttime: n/c >> 134 >> n/c Lowest sugar was 65 >> 89 >> 84 >> 79 >> 61 x1 Tingling; she has hypoglycemia awareness in the 70s. Highest sugar was 425 >> 291 >> 249 >> 241 >> 422.  Pt's meals are: - Breakfast: eggs, cereals, fruit, oatmeal, bacon - Lunch: sandwich, beef, mashed potatoes,diet soda - Dinner: chicken, beef, fish,  potatoes, vegetables - Snacks: 1-2: fruit  -+ Mild CKD; last BUN/creatinine:  Lab Results  Component Value Date   BUN 24 (H) 06/12/2019   CREATININE 0.87 06/12/2019  On losartan. -+ HL; last set of lipids: Lab Results  Component Value Date   CHOL 187 02/12/2019   HDL 46 02/12/2019   LDLCALC 106 (H) 02/12/2019   TRIG 202 (H) 02/12/2019   CHOLHDL 3.5 09/30/2018  On Crestor and Zetia. - last eye exam was in 2020: + DR; Had cataract sx B. Dr. Hassell Done [facility]). -She has numbness and tingling mainly in the left leg-affected by stroke. Sees podiatry.  Hypothyroidism: -Diagnosed many years ago  Pt is on levothyroxine 50 mcg daily since her hospitalization in 11/2018 (previously she was taking a lower dose): -In a.m. -Fasting -At least 30 minutes from breakfast -No calcium, iron, PPIs -+ Multivitamins with lunch, AcipHex with lunch and dinner -+ Biotin in B complex  Reviewed her TFTs: Lab Results  Component Value Date   TSH 0.94 02/17/2019   TSH 0.82 09/30/2018   TSH 0.52 10/08/2017   TSH 0.827 10/15/2016   TSH 0.906 01/22/2016   Thyroid nodules.    Pt denies: - feeling nodules in neck - hoarseness - choking - SOB with lying down She has chronic dysphagia with certain foods.  She had previous esophageal dilations.  Reviewed her imaging and biopsy test: thyroid U/S (10/2013):  Right thyroid lobe: 3.7 x  1.0 x 1.8 cm   Left thyroid lobe: 3.5 x 1.5 x 1.6 cm   Isthmus: 0.5 cm   Focal nodules: There is a 0.3 mm calcified nodule in the right midzone. There is a 0.6 x 0.7 x 0.5 cm hypoechoic nodule in the  lateral aspect of the right midzone. There is a 0.7 x 0.6 x 0.5 cm hyperechoic nodule in the lateral aspect of the right lower pole.  There is a 2.2 x 1.1 x 1.7 cm complex solid nodule in the inferior aspect of the isthmus extending adjacent to the left lower pole.  There is a 1.4 x 2.1 x 1.4 cm complex solid nodule in the left lower pole.   Lymphadenopathy: None  visualized.  Repeat U/S (01/2014): Stable appearance of the nodules  FNA (2014) x2: Benign  Repeat U/S (10/2015): stable appearance of the nodules  Repeat U/S (10/2016): Stable appearance of the nodules  She had a L rib fracture in 12/2014.  Patient was admitted with AMS + sepsis on 10/15/2016.  She had aspiration pneumonia and acute respiratory failure  She has a recurrent meningioma >> seeing Dr. Rita Ohara.  She will have an MRI of her left shoulder soon.  For now, she is getting injections in the shoulder.  ROS: Constitutional: no weight gain/no weight loss, no fatigue, no subjective hyperthermia, no subjective hypothermia Eyes: no blurry vision, no xerophthalmia ENT: no sore throat, + see HPI Cardiovascular: no CP/no SOB/no palpitations/no leg swelling Respiratory: no cough/no SOB/no wheezing Gastrointestinal: no N/no V/no D/no C/no acid reflux Musculoskeletal: no muscle aches/+ joint aches Skin: no rashes, no hair loss Neurological: no tremors/+ numbness/+ tingling/no dizziness  I reviewed pt's medications, allergies, PMH, social hx, family hx, and changes were documented in the history of present illness. Otherwise, unchanged from my initial visit note.  Past Medical History:  Diagnosis Date  . Allergy   . Anemia   . Anxiety    takes Ativan daily  . Arthritis   . Assistance needed for mobility   . Bipolar disorder ([place])    takes Risperdal nightly  . Blood transfusion   . Cancer ([place])    In her gum  . Carpal tunnel syndrome of right wrist 05/23/2011  . Cervical disc disorder with radiculopathy of cervical region 10/31/2012  . Chronic back pain   . Chronic idiopathic constipation   . Chronic neck and back pain   . Colon polyps   . COPD (chronic obstructive pulmonary disease) with chronic bronchitis ([place]) 09/16/2013   Office Spirometry 10/30/2013-submaximal effort based on appearance of loop and curve. Numbers would fit with severe restriction but her physiologic  capability may be better than this. FVC 0.91/44%, and 10.74/45%, FEV1/FVC 0.81, FEF 25-75% 1.43/69%    . Diabetes mellitus    Type II  . Diverticulosis    TCS 9/08 by Dr. Delfin Edis for diarrhea . Bx for micro scopic colitis negative.   . Fibromyalgia   . Frequent falls   . GERD (gastroesophageal reflux disease)    takes Aciphex daily  . Glaucoma    eye drops daily  . Gum symptoms    infection on antibiotic  . Hemiplegia affecting non-dominant side, post-stroke 08/02/2011  . Hiatal hernia   . Hyperlipidemia    takes Crestor daily  . Hypertension    takes Amlodipine,Metoprolol,and Clonidine daily  . Hypothyroidism    takes Synthroid daily  . IBS (irritable bowel syndrome)   . Insomnia    takes Trazodone nightly  .  Major depression, recurrent ([place])    takes Zoloft daily  . 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 Metabolic encephalopathy [id number]  . Migraines    chronic headaches  . Mononeuritis lower limb   . Narcolepsy   . Osteoporosis   . Pancreatitis 2006   due to Depakote with normal EUS   . Schatzki's ring    non critical / EGD with ED 8/2011with RMR  . Seizures ([place])    takes Lamictal daily.Last seizure 3 yrs ago  . Sleep apnea    on CPAP  . Small bowel obstruction ([place])   . Stroke [facility])    left sided weakness, speech changes  . Tubular adenoma of colon    Past Surgical History:  Procedure Laterality Date  . ABDOMINAL HYSTERECTOMY  1978  . BACK SURGERY  July 2012  . BACTERIAL OVERGROWTH TEST N/A 05/05/2013   Procedure: BACTERIAL OVERGROWTH TEST;  Surgeon: Daneil Dolin, MD;  [facility];  Service: Endoscopy;  Laterality: N/A;  7:30  . BIOPSY THYROID  2009  . BRAIN SURGERY  11/2011   resection of meningioma  . BREAST REDUCTION SURGERY  1994  . CARDIAC CATHETERIZATION  05/10/2005   normal coronaries, normal LV systolic function and EF (Dr. Jackie Plum)  . CARPAL TUNNEL RELEASE Left 07/22/04   Dr. Aline Brochure  . CATARACT EXTRACTION Bilateral   .  CHOLECYSTECTOMY  1984  . COLONOSCOPY N/A 09/25/2012   [id number] diverticulosis.  colonic polyp-removed : tubular adenoma  . CRANIOTOMY  11/23/2011   Procedure: CRANIOTOMY TUMOR EXCISION;  Surgeon: Hosie Spangle, MD;  Location: [place] NEURO ORS;  Service: Neurosurgery;  Laterality: N/A;  Craniotomy for tumor resection  . ESOPHAGOGASTRODUODENOSCOPY  12/29/2010   Rourk-Retained food in the esophagus and stomach, small hiatal hernia, status post Maloney dilation of the esophagus  . ESOPHAGOGASTRODUODENOSCOPY N/A 09/25/2012   [id number] atonic baggy esophagus status post Maloney dilation 35 F. Hiatal hernia  . GIVENS CAPSULE STUDY N/A 01/15/2013   NORMAL.   . IR GENERIC HISTORICAL  03/17/2016   IR RADIOLOGIST EVAL & MGMT 03/17/2016 [facility]-INTERV RAD  . LESION REMOVAL N/A 05/31/2015   Procedure: REMOVAL RIGHT AND LEFT LESIONS OF MANDIBLE;  Surgeon: Diona Browner, DDS;  Location: [place];  Service: Oral Surgery;  Laterality: N/A;  . MALONEY DILATION  12/29/2010   RMR;  . NM MYOCAR PERF WALL MOTION  2006   "relavtiely normal" persantine, mild anterior thinning (breast attenuation artifact), no region of scar/ischemia  . OVARIAN CYST REMOVAL    . RECTOCELE REPAIR N/A 06/29/2015   Procedure: POSTERIOR REPAIR (RECTOCELE);  Surgeon: Jonnie Kind, MD;  Location: [facility];  Service: Gynecology;  Laterality: N/A;  . REDUCTION MAMMAPLASTY Bilateral   . SPINE SURGERY  09/29/2010   Dr. Rolena Infante  . surgical excision of 3 tumors from right thigh and right buttock  and left upper thigh  2010  . TOOTH EXTRACTION Bilateral 12/14/2014   Procedure: REMOVAL OF BILATERAL MANDIBULAR EXOSTOSES;  Surgeon: Diona Browner, DDS;  Location: [place];  Service: Oral Surgery;  Laterality: Bilateral;  . TRANSTHORACIC ECHOCARDIOGRAM  2010   EF 60-65%, mild conc LVH, grade 1 diastolic dysfunction; mildly calcified MV annulus with mildly thickened leaflets, mildly calcified MR annulus   History   Social History  . Marital Status: Divorced     Spouse Name: N/A    Number of Children: 1  . Years of Education: 12   Occupational History  . disabled     Social History Main Topics  .  Smoking status: Current Every Day Smoker -- 0.25 packs/day for 7 years    Types: Cigarettes  . Smokeless tobacco: Never Used     Comment: "started back but off now for 3 months" (08/18/13)  . Alcohol Use: No     Comment:    . Drug Use: No   Current Outpatient Medications on File Prior to Visit  Medication Sig Dispense Refill  . ACCU-CHEK AVIVA PLUS test strip CHECK BLOOD SUGAR FOUR TIMES DAILY 400 strip 11  . albuterol (VENTOLIN HFA) 108 (90 Base) MCG/ACT inhaler Inhale 2 puffs into the lungs every 6 (six) hours as needed for wheezing or shortness of breath. 18 g 0  . alendronate (FOSAMAX) 70 MG tablet Take 1 tablet (70 mg total) by mouth every 7 (seven) days. Take with a full glass of water on an empty stomach. 12 tablet 3  . amLODipine (NORVASC) 10 MG tablet TAKE 1 TABLET EVERY DAY 90 tablet 3  . ascorbic acid (VITAMIN C) 500 MG tablet Take 500 mg by mouth daily.    [name] aspirin EC 81 MG tablet Take 1 tablet (81 mg total) by mouth daily with breakfast. 120 tablet 2  . buPROPion (WELLBUTRIN XL) 150 MG 24 hr tablet Take 1 tablet (150 mg total) by mouth every morning. 30 tablet 2  . cetirizine (ZYRTEC) 10 MG tablet Take 1 tablet (10 mg total) by mouth daily. 7 tablet 0  . clobetasol cream (TEMOVATE) [id number] % Apply 1 application topically 2 (two) times daily.     . cromolyn (NASALCROM) 5.2 MG/ACT nasal spray Place 1 spray into both nostrils 4 (four) times daily. 13 mL 1  . diclofenac sodium (VOLTAREN) 1 % GEL APPLY 2 GRAMS  TOPICALLY 4 (FOUR) TIMES DAILY. 200 g 1  . diclofenac Sodium (VOLTAREN) 1 % GEL APPLY 2 GRAMS  TOPICALLY 4 (FOUR) TIMES DAILY. 800 g 0  . ezetimibe (ZETIA) 10 MG tablet Take 1 tablet (10 mg total) by mouth daily. 90 tablet 1  . fluticasone (FLONASE) 50 MCG/ACT nasal spray Place 2 sprays into both nostrils daily.    . hydroxychloroquine  (PLAQUENIL) 200 MG tablet     . insulin aspart (NOVOLOG FLEXPEN) 100 UNIT/ML FlexPen INJECT 10 TO 14 UNITS INTO THE SKIN 3 (THREE) TIMES DAILY WITH MEALS. 45 mL 3  . Insulin Glargine, 2 Unit Dial, (TOUJEO MAX SOLOSTAR) 300 UNIT/ML SOPN Inject 25 Units into the skin at bedtime. 9 pen 3  . lamoTRIgine (LAMICTAL) 100 MG tablet TAKE 1 TABLET TWICE DAILY 180 tablet 2  . Lancets (ACCU-CHEK SOFT TOUCH) lancets Use as instructed to check blood sugar 3 times a day. 100 each 12  . levothyroxine (SYNTHROID) 50 MCG tablet TAKE 1 TABLET DAILY, EXCEPT TAKE 1/2 TABLET ON SUNDAY 85 tablet 1  . linaclotide (LINZESS) 145 MCG CAPS capsule Take 1 capsule (145 mcg total) by mouth daily before breakfast. 30 capsule 6  . LORazepam (ATIVAN) 0.5 MG tablet Take 1 tablet (0.5 mg total) by mouth 3 (three) times daily. 270 tablet 2  . losartan (COZAAR) 50 MG tablet TAKE 1 TABLET EVERY DAY 90 tablet 1  . lubiprostone (AMITIZA) 24 MCG capsule Take 1 capsule (24 mcg total) by mouth 2 (two) times daily with a meal. 60 capsule 6  . metaxalone (SKELAXIN) 800 MG tablet metaxalone 800 mg tablet  take 1 tablet by mouth 3 times daily as needed.    . metoprolol tartrate (LOPRESSOR) 50 MG tablet TAKE 1 TABLET TWICE DAILY (NEED MD APPOINTMENT)  180 tablet 3  . montelukast (SINGULAIR) 10 MG tablet TAKE 1 TABLET EVERY DAY 90 tablet 1  . NEOMYCIN-POLYMYXIN-HC, OPHTH, SUSP neomycin 3.5 mg-polymyxin 10,000 unit-hydrocort 10 mg/mL eye drop,susp    . nystatin (MYCOSTATIN/NYSTOP) powder APPLY TO AFFECTED AREA 4 TIMES DAILY. 90 g 1  . Omega-3 Fatty Acids (FISH OIL PO) Take 1 capsule by mouth daily.    . ondansetron (ZOFRAN) 4 MG tablet Take 1 tablet (4 mg total) by mouth every 8 (eight) hours as needed for nausea or vomiting. 20 tablet 0  . oxyCODONE-acetaminophen (PERCOCET) 10-325 MG tablet Take 1 tablet by mouth every 8 (eight) hours as needed.     . pantoprazole (PROTONIX) 40 MG tablet Take 1 tablet (40 mg total) by mouth daily before breakfast.  30 tablet 0  . pregabalin (LYRICA) 75 MG capsule Take 1 capsule (75 mg total) by mouth daily.    . RABEprazole (ACIPHEX) 20 MG tablet TAKE 1 TABLET TWICE DAILY 180 tablet 3  . RESTASIS 0.05 % ophthalmic emulsion Place 1 drop into both eyes 2 (two) times daily.     . risperiDONE (RISPERDAL) 0.5 MG tablet Take 1 tablet (0.5 mg total) by mouth at bedtime. 90 tablet 2  . rosuvastatin (CRESTOR) 5 MG tablet TAKE 1 TABLET AT BEDTIME 90 tablet 2  . sertraline (ZOLOFT) 100 MG tablet Take 1 tablet (100 mg total) by mouth daily. 90 tablet 2  . SHINGRIX injection     . STIOLTO RESPIMAT 2.5-2.5 MCG/ACT AERS INHALE 2 PUFFS INTO THE LUNGS DAILY. 12 g 3  . traZODone (DESYREL) 100 MG tablet Take 1 tablet (100 mg total) by mouth at bedtime. 90 tablet 2   Current Facility-Administered Medications on File Prior to Visit  Medication Dose Route Frequency Provider Last Rate Last Admin  . 0.9 %  sodium chloride infusion   Intravenous Continuous Lockamy, Randi L, NP-C   Stopped at 12/31/18 1542      Allergies  Allergen Reactions  . Cephalexin Hives  . Iron Nausea And Vomiting  . Milk-Related Compounds Other (See Comments)    Doesn't agree with stomach.   . Penicillins Hives       . Phenazopyridine Hcl Hives         Family History  Problem Relation Age of Onset  . Heart attack Mother        HTN  . Pneumonia Father   . Kidney failure Father   . Diabetes Father   . Pancreatic cancer Sister   . Diabetes Brother   . Hypertension Brother   . Diabetes Brother   . Cancer Sister        breast   . Hypertension Son   . Sleep apnea Son   . Cancer Sister        pancreatic  . Stroke Maternal Grandmother   . Heart attack Maternal Grandfather   . Alcohol abuse Maternal Uncle   . Colon cancer Neg Hx   . Anesthesia problems Neg Hx   . Hypotension Neg Hx   . Malignant hyperthermia Neg Hx   . Pseudochol deficiency Neg Hx   . Breast cancer Neg Hx    PE: BP (!) 160/80   Pulse 81   Ht 4\' 11"  (1.499 m)    Wt 162 lb (73.5 kg)   SpO2 97%   BMI 32.72 kg/m  Body mass index is 32.72 kg/m.  Wt Readings from Last 3 Encounters:  06/24/19 162 lb (73.5 kg)  06/10/19 162 lb (73.5 kg)  04/21/19 162 lb (73.5 kg)   Constitutional: overweight, in NAD Eyes: PERRLA, EOMI, no exophthalmos ENT: moist mucous membranes, no thyromegaly, no cervical lymphadenopathy Cardiovascular: RRR, No MRG Respiratory: CTA B Gastrointestinal: abdomen soft, NT, ND, BS+ Musculoskeletal: no deformities, strength intact in all 4 Skin: moist, warm, no rashes Neurological: no tremor with outstretched hands, DTR normal in all 4  ASSESSMENT: 1. DM2, insulin-dependent, uncontrolled, without complications - gastroparesis - cerebrovasc. Ds -  H/o stroke - PN  She was interested in an insulin pump >> discussed about [employer] (given brochure).  2. Hypothyroidism  3. MNG - Thyroid U/S (10/11/2012):  Right thyroid lobe: 3.7 x 1.0 x 1.8 cm  Left thyroid lobe: 3.5 x 1.5 x 1.6 cm  Isthmus: 0.5 cm  Focal nodules: There is a 0.3 mm calcified nodule in the right midzone. There is a 0.6 x 0.7 x 0.5 cm hypoechoic nodule in the lateral aspect of the right midzone. There is a 0.7 x 0.6 x 0.5 cm hyperechoic nodule in the lateral aspect of the right lower pole. There is a 2.2 x 1.1 x 1.7 cm complex solid nodule in the inferior aspect of the isthmus extending adjacent to the left lower pole. There is a 1.4 x 2.1 x 1.4 cm complex solid nodule in the left lower pole.  Lymphadenopathy: None visualized.  IMPRESSION: Multi nodular goiter which has markedly progressed since the prior exam. The dominant nodule at the inferior aspect of the isthmus fits criteria for fine needle aspiration biopsy if not previously Assessed.  - FNA (11/05/2012) x2: benign  - Thyroid U/S (01/20/2014) - felt one nodule enlarging >> new U/S: stable appearance of the nodules, except a small new nodule in isthmus: 1.2 cm   Right thyroid lobe  Measurements: 4.4 cm x 1.2 cm x 1.7 cm. Multiple right-sided nodules identified. Each right-sided nodule demonstrates increased echogenicity, with the superior measuring 7 mm -8 mm, most inferior measuring 9 mm - 10 mm. A small, 3 mm focus of calcium with posterior shadowing is evident. There is also a mid nodule measuring 7 mm, which is echogenic.  Left thyroid lobe Measurements: 5.1 cm x 2.1 cm x 2.3 cm. Dominant lesion at the inferior pole of left thyroid is again evident, which has been previously biopsied (10/29/2012). This nodule measures 1.8 cm x 1.7cm x 2.5 cm. (Previous 1.4 cm x 2.1 cm x 1.4 cm)  Isthmus Thickness: 4 mm-5 mm. Isthmic nodule again noted, previously biopsied (10/29/2012). Currently this nodule measures 2.2 cm x 1.4 cm x 1.8 cm. (previous measurement 2.2 cm x 1.1 cm x 1.7 cm). There is a new nodule identified within the isthmus with heterogeneously hyperechoic characteristics. This nodule measures 12 mm x 5.4 mm x 7.2 mm.  Lymphadenopathy: None visualized.  - Thyroid U/S (11/09/2015): Details   Reading Physician Reading Date Result Priority  Sandi Mariscal, MD 11/09/2015   Narrative    COMPARISON: Thyroid ultrasound - 01/20/2014 ; 10/11/2012  FINDINGS: Parenchymal Echotexture: Moderately heterogenous Estimated total number of nodules > 1 cm: <5 Number of spongiform nodules > 2 cm not described below (TR1): 0 Number of mixed cystic nodules > 1.5 cm not described below ([place]): 0 _____________________________________________________  Isthmus: 0.7 cm  Nodule # 1: Prior biopsy: No Location: Isthmus; left Size: 1.5 x 1.3 x 1.5 cm, previously 2.2 x 1.4 x 1.8 cm Composition: solid/almost completely solid (2) Echogenicity: hyperechoic (1) ACR TI-RADS total points: 3. Change in features: Yes. Increased internal cystic degeneration and smaller in size.  Change in ACR TI-RADS risk category: No  *Given size (1.5 - 2.4 cm) and appearance, a follow-up ultrasound in 1 year is  recommended based on TI-RADS criteria as clinically indicated.  _________________________________________________________  Right lobe: 4.6 x 1.1 x 1.9 cm, previously 4.4 x 1.2 x 1.7 cm Stable scattered subcentimeter echogenic nodules _________________________________________________________  Left lobe: 4.8 x 2.1 x 1.9 cm, previously 5.1 x 2.1 x 2.3 cm Nodule # 1: Prior biopsy: No Location: Left; Inferior Size: 2.4 x 1.7 x 1.9 cm, previously 2.5 x 1.7 x 1.7 Composition: solid/almost completely solid (2) Echogenicity: hyperechoic (1) ACR TI-RADS total points: 3. ACR TI-RADS risk category: TR3 (3 points). Change in features: No Change in ACR TI-RADS risk category: No *Given size (1.5 - 2.4 cm) and appearance, a follow-up ultrasound in 1 year is recommended based on TI-RADS criteria as clinically indicated.  IMPRESSION: TR 3 isthmic and left thyroid nodules unchanged. *Given size (1.5 - 2.4 cm) and appearance of both thyroid nodules, a follow-up ultrasound in 1 year is recommended based on TI-RADS criteria as clinically indicated.  The above is in keeping with the ACR TI-RADS recommendations Natasha Mead Coll Radiol 2017;14:[id number].        10/12/2016: Thyroid U/S: Parenchymal Echotexture: Moderately heterogenous Isthmus: 1.1 cm Right lobe: 3.8 x 1.0 x 1.7 cm Left lobe: 4.5 x 1.7 x 2.1 cm ______________________________________________________  Estimated total number of nodules >/= 1 cm: 3 _____________________________________________  Diffusely heterogeneous multinodular thyroid gland. As seen previously, there are multiple small echogenic nodules scattered throughout the right gland which do not meet criteria for biopsy or follow-up. Head and lobular pyramidal lobe extends superiorly from the thyroid isthmus. No interval change.  The previously biopsied nodule in the inferior aspect of the isthmus measures slightly smaller today at 1.5 x 1.0 x 1.4 cm compared to 1.7 x  1.3 x 1.5 cm previously.  The previously biopsied nodule in the left inferior gland is essentially unchanged at 2.6 x 1.3 x 1.5 cm compared to 2.4 x 1.8 x 1.9 cm. No new nodule or abnormality identified.  IMPRESSION: 1. Stable multinodular goiter with a prominent lobular parental lobe extending superiorly from the thyroid isthmus. 2. The previously biopsied nodule in the inferior isthmus measures slightly smaller on today's examination. 3. The previously biopsied nodule in the left inferior gland is stable. 4. Additional scattered small and benign-appearing nodules do not meet criteria for biopsy or continued follow-up. The above is in keeping with the ACR TI-RADS recommendations - J Am Coll Radiol 2017;14:[id number].   PLAN:  1. Patient with longstanding, uncontrolled, type 2 diabetes, on basal-bolus insulin regimen, with slightly higher sugars now, but quite fluctuating, with spikes in the 200s to 400s at the time of her steroid injections.  Unfortunately, she has to get these repeatedly.  She will have an MRI on her shoulder soon. -Reviewing her sugars at home, she had 1 low blood sugar at 61, but the rest are higher than 70.  Highest sugars are usually before dinner and we discussed that she may need to take a higher dose of NovoLog with lunch.  She currently takes approximately 12 units with this meal.  I advised her to try to take 14. -Also, with her sugars increasing significantly during the steroid injections, I advised her to increase the NovoLog dose to 16-18 units around the time of her steroid injections or prednisone tapers. -We will continue the same dose of Toujeo for now. -She is now taking NovoLog before meals (  at last visit she was taking this after meals) and will continue this. -She continues to get iron infusions.  Last was more than 3 months ago, though.  We discussed that this can influence her HbA1c levels.  It is possible that her previous HbA1c were lower after iron  infusions. - I advised her to:  Patient Instructions  Please continue - Toujeo 25 units at bedtime - Novolog: 10-14 units before meal (try 14 units with lunch) (16-18 units before meals in the days you have a steroid injection or Prednisone)  Please continue levothyroxine 50 mcg Daily.  Please return in 4 months.  - we checked her HbA1c: 6.4% (higher) - advised to check sugars at different times of the day - 3x a day, rotating check times - advised for yearly eye exams >> she is UTD - return to clinic in 4 months   2. Hypothyroidism - she was previously on levothyroxine 50 mcg 6 out of 7 days and 25 mcg 1 out of 7 days, but this was changed to 50 mcg daily after her hospitalization last year. - latest thyroid labs reviewed with pt >> normal: Lab Results  Component Value Date   TSH 0.94 02/17/2019   - she continues on LT4 50 mcg daily - pt feels good on this dose. - we discussed about taking the thyroid hormone every day, with water, >30 minutes before breakfast, separated by >4 hours from acid reflux medications, calcium, iron, multivitamins. Pt. is taking it correctly.  3. MNG -She has a history of 2 normal thyroid nodule biopsied in 2014 -We reviewed the ultrasound report from 2014-2019 and the nodules appear stable -We do not need to repeat a thyroid ultrasound unless she starts developing neck compression symptoms.  She has mild chronic dysphagia but does not have esophageal compression per the barium swallow study from 08/2014.  At that time she was found to have esophageal dysmotility and had multiple esophageal dilations. -We will continue to follow this expectantly for now   Philemon Kingdom, MD PhD [facility] Endocrinology

## 2019-06-24 NOTE — Patient Instructions (Addendum)
Please continue - Toujeo 25 units at bedtime - Novolog: 10-14 units before meal (try 14 units with lunch) (16-18 units before meals in the days you have a steroid injection or Prednisone)  Please continue levothyroxine 50 mcg Daily.  Please return in 4 months.

## 2019-06-24 NOTE — Addendum Note (Signed)
Addended by: Cardell Peach I on: 06/24/2019 11:08 AM   Modules accepted: Orders

## 2019-06-25 ENCOUNTER — Encounter (HOSPITAL_COMMUNITY): Payer: Self-pay

## 2019-06-25 ENCOUNTER — Inpatient Hospital Stay (HOSPITAL_COMMUNITY): Payer: Medicare HMO

## 2019-06-25 VITALS — BP 140/42 | HR 66 | Temp 97.5°F | Resp 18

## 2019-06-25 DIAGNOSIS — Z79899 Other long term (current) drug therapy: Secondary | ICD-10-CM | POA: Diagnosis not present

## 2019-06-25 DIAGNOSIS — D509 Iron deficiency anemia, unspecified: Secondary | ICD-10-CM | POA: Diagnosis not present

## 2019-06-25 MED ORDER — SODIUM CHLORIDE 0.9 % IV SOLN
510.0000 mg | Freq: Once | INTRAVENOUS | Status: AC
  Administered 2019-06-25: 510 mg via INTRAVENOUS
  Filled 2019-06-25: qty 510

## 2019-06-25 MED ORDER — SODIUM CHLORIDE 0.9 % IV SOLN: INTRAVENOUS | Status: DC

## 2019-06-25 MED ORDER — ONDANSETRON HCL 4 MG/2ML IJ SOLN
4.0000 mg | Freq: Once | INTRAMUSCULAR | Status: AC
  Administered 2019-06-25: 4 mg via INTRAVENOUS
  Filled 2019-06-25: qty 2

## 2019-06-25 MED ORDER — METHYLPREDNISOLONE SODIUM SUCC 125 MG IJ SOLR
125.0000 mg | Freq: Once | INTRAMUSCULAR | Status: AC
  Administered 2019-06-25: 125 mg via INTRAVENOUS
  Filled 2019-06-25: qty 2

## 2019-06-25 NOTE — Progress Notes (Signed)
Patient tolerated iron infusion with no complaints voiced.  Peripheral IV site clean and dry with good blood return noted before and after infusion.  Band aid applied.  VSS with discharge and left ambulatory with no s/s of distress noted.  

## 2019-06-27 ENCOUNTER — Telehealth: Payer: Self-pay

## 2019-06-27 ENCOUNTER — Ambulatory Visit: Payer: Medicare HMO | Admitting: *Deleted

## 2019-06-27 MED ORDER — BLOOD GLUCOSE METER KIT: PACK | 0 refills | Status: DC

## 2019-06-27 NOTE — Telephone Encounter (Signed)
Patient asking for new meter to local pharmacy.

## 2019-06-29 DIAGNOSIS — E785 Hyperlipidemia, unspecified: Secondary | ICD-10-CM | POA: Diagnosis not present

## 2019-06-29 DIAGNOSIS — G4733 Obstructive sleep apnea (adult) (pediatric): Secondary | ICD-10-CM | POA: Diagnosis not present

## 2019-07-01 ENCOUNTER — Ambulatory Visit: Payer: Medicare HMO | Admitting: Internal Medicine

## 2019-07-01 DIAGNOSIS — M25512 Pain in left shoulder: Secondary | ICD-10-CM | POA: Diagnosis not present

## 2019-07-01 DIAGNOSIS — M19012 Primary osteoarthritis, left shoulder: Secondary | ICD-10-CM | POA: Diagnosis not present

## 2019-07-02 ENCOUNTER — Ambulatory Visit (INDEPENDENT_AMBULATORY_CARE_PROVIDER_SITE_OTHER)
Admission: RE | Admit: 2019-07-02 | Discharge: 2019-07-02 | Disposition: A | Discharge status: Home / Self Care | Payer: Medicare HMO | Source: Ambulatory Visit | Attending: Internal Medicine | Admitting: Internal Medicine

## 2019-07-02 ENCOUNTER — Encounter: Payer: Self-pay | Admitting: Internal Medicine

## 2019-07-02 ENCOUNTER — Other Ambulatory Visit: Payer: Self-pay

## 2019-07-02 ENCOUNTER — Ambulatory Visit: Payer: Medicare HMO | Admitting: Family Medicine

## 2019-07-02 ENCOUNTER — Ambulatory Visit (INDEPENDENT_AMBULATORY_CARE_PROVIDER_SITE_OTHER): Payer: Medicare HMO | Admitting: Internal Medicine

## 2019-07-02 VITALS — BP 132/60 | HR 65 | Temp 97.4°F | Ht 59.0 in | Wt 162.2 lb

## 2019-07-02 DIAGNOSIS — Z86010 Personal history of colonic polyps: Secondary | ICD-10-CM

## 2019-07-02 DIAGNOSIS — K5900 Constipation, unspecified: Secondary | ICD-10-CM | POA: Diagnosis not present

## 2019-07-02 DIAGNOSIS — K219 Gastro-esophageal reflux disease without esophagitis: Secondary | ICD-10-CM

## 2019-07-02 DIAGNOSIS — K5730 Diverticulosis of large intestine without perforation or abscess without bleeding: Secondary | ICD-10-CM | POA: Diagnosis not present

## 2019-07-02 DIAGNOSIS — R109 Unspecified abdominal pain: Secondary | ICD-10-CM

## 2019-07-02 DIAGNOSIS — M6289 Other specified disorders of muscle: Secondary | ICD-10-CM

## 2019-07-02 DIAGNOSIS — K5904 Chronic idiopathic constipation: Secondary | ICD-10-CM | POA: Diagnosis not present

## 2019-07-02 MED ORDER — DICYCLOMINE HCL 10 MG PO CAPS: 10.0000 mg | ORAL_CAPSULE | Freq: Three times a day (TID) | ORAL | 2 refills | Status: DC | PRN

## 2019-07-02 MED ORDER — LINACLOTIDE 72 MCG PO CAPS: 72.0000 ug | ORAL_CAPSULE | Freq: Every day | ORAL | 2 refills | Status: DC

## 2019-07-02 MED ORDER — LUBIPROSTONE 24 MCG PO CAPS: 24.0000 ug | ORAL_CAPSULE | Freq: Two times a day (BID) | ORAL | 2 refills | Status: DC

## 2019-07-02 NOTE — Patient Instructions (Addendum)
Continue Aciphex twice daily.  Decrease your Linzess dosage to 72 mcg daily (we will send a new prescription for this)  Discontinue Miralax.  We have sent the following medications to your pharmacy for you to pick up at your convenience: Amitiza 24 mcg twice daily. Bentyl 10 mg three times daily as needed for cramping.  Your provider has requested that you have an abdominal x ray before leaving today. Please go to the basement floor to our Radiology department for the test.  Please follow up with Dr Hilarie Fredrickson on 08/13/19 at 9:50 am.  If you are age 49 or older, your body mass index should be between 23-30. Your Body mass index is 32.77 kg/m. If this is out of the aforementioned range listed, please consider follow up with your Primary Care Provider.  If you are age 18 or younger, your body mass index should be between 19-25. Your Body mass index is 32.77 kg/m. If this is out of the aformentioned range listed, please consider follow up with your Primary Care Provider.

## 2019-07-02 NOTE — Progress Notes (Signed)
Subjective:    Patient ID: [name], female    DOB: Nov 01, 1950, 69 y.o.   MRN: [id number]  HPI [name] is a 69 year old female with a history of chronic constipation, colon polyps, colonic diverticulosis, pelvic floor dysfunction with history of rectocele and cystocele, GERD, prior cholecystectomy, history of small bowel obstruction, sleep apnea, hypertension, diabetes and depression who is seen for follow-up.  She is here alone today and I last saw her in November 2020.  She reports that she continues to struggle with constipation, abdominal discomfort and bloating.  She has more issues with abdominal discomfort in the left abdomen which can radiate into the lower abdomen.  At times this feels cramping in nature.  She is using Amitiza 24 mcg twice daily and Linzess 145 mcg on most days.  The Linzess gives her urgent loose stools which can be difficult to control and so she does not use this if she is leaving the house.  If she does not use it she does not have complete bowel movements and feels more bloated and uncomfortable.  She is using MiraLAX occasionally 3 times a day before meals though she is not sure it helps.  Her reflux is much more controlled since primary care switch pantoprazole to AcipHex.  She is using AcipHex 20 mg twice a day.  She will occasionally have issues with stringy meats and dry foods like bread as far as her swallowing.  No odynophagia.  She does not feel like she needs her esophagus dilated however.  She had a CT scan of the abdomen pelvis in October 2020 which showed no acute processes.  There was colonic diverticulosis without diverticulitis.  Review of Systems  As per HPI, otherwise  Current Medications, Allergies, Past Medical History, Past Surgical History, Family History and Social History were reviewed in [employer] record.     Objective:   Physical Exam BP 132/60 (BP Location: Right Arm, Patient Position: Sitting, Cuff Size:  Normal)   Pulse 65   Temp (!) 97.4 F (36.3 C)   Ht 4\' 11"  (1.499 m)   Wt 162 lb 4 oz (73.6 kg)   SpO2 95%   BMI 32.77 kg/m  Gen: awake, alert, NAD HEENT: anicteric CV: RRR, no mrg Pulm: CTA b/l Abd: soft, obese, mildly distended, no significant tenderness including no rebound or guarding, +BS throughout Ext: no c/c/e Neuro: nonfocal  CBC    Component Value Date/Time   WBC 6.5 06/12/2019 1221   RBC 5.13 (H) 06/12/2019 1221   HGB 11.5 (L) 06/12/2019 1221   HGB 10.3 (L) 01/29/2018 1315   HCT 40.7 06/12/2019 1221   HCT 33.8 (L) 01/29/2018 1315   PLT 216 06/12/2019 1221   PLT 275 01/29/2018 1315   MCV 79.3 (L) 06/12/2019 1221   MCV 75 (L) 01/29/2018 1315   MCH 22.4 (L) 06/12/2019 1221   MCHC 28.3 (L) 06/12/2019 1221   RDW 14.4 06/12/2019 1221   RDW 12.8 01/29/2018 1315   LYMPHSABS 1.4 06/12/2019 1221   LYMPHSABS 2.4 01/29/2018 1315   MONOABS 0.7 06/12/2019 1221   EOSABS 0.2 06/12/2019 1221   EOSABS 0.2 01/29/2018 1315   BASOSABS 0.0 06/12/2019 1221   BASOSABS 0.0 01/29/2018 1315   CMP     Component Value Date/Time   NA 141 06/12/2019 1221   NA 146 (H) 02/12/2019 1150   K 4.0 06/12/2019 1221   CL 106 06/12/2019 1221   CO2 23 06/12/2019 1221   GLUCOSE 203 (  H) 06/12/2019 1221   BUN 24 (H) 06/12/2019 1221   BUN 32 (H) 02/12/2019 1150   CREATININE 0.87 06/12/2019 1221   CREATININE 1.24 (H) 09/30/2018 1143   CALCIUM 9.2 06/12/2019 1221   PROT 7.3 06/12/2019 1221   PROT 6.8 02/12/2019 1150   ALBUMIN 4.0 06/12/2019 1221   ALBUMIN 4.3 02/12/2019 1150   AST 19 06/12/2019 1221   ALT 18 06/12/2019 1221   ALKPHOS 101 06/12/2019 1221   BILITOT 0.5 06/12/2019 1221   BILITOT 0.2 02/12/2019 1150   GFRNONAA >60 06/12/2019 1221   GFRNONAA 45 (L) 09/30/2018 1143   GFRAA >60 06/12/2019 1221   GFRAA 52 (L) 09/30/2018 1143         Assessment & Plan:  69 year old female with a history of chronic constipation, colon polyps, colonic diverticulosis, pelvic floor  dysfunction with history of rectocele and cystocele, GERD, prior cholecystectomy, history of small bowel obstruction, sleep apnea, hypertension, diabetes and depression who is seen for follow-up.   1.  Chronic constipation/colonic diverticulosis/pelvic floor dysfunction --there are multiple factors at play which affect her bowel movements and these include her chronic constipation, her chronic narcotic pain medication use which worsens constipation, her left-sided colonic diverticulosis and also pelvic floor dysfunction with rectocele.  We have titrated laxatives though it sounds like her dose of Linzess is too high and is causing urgent loose stools.  I am not sure MiraLAX is helping at all and may be causing worsening bloating.  We discussed this at length today and decided on the following: --Continue Amitiza 24 mcg twice daily with meals --Reduce Linzess to 72 mcg daily --Discontinue MiraLAX --Bentyl 10 mg 3 times daily as needed for crampy abdominal pain; I encouraged her to use this only as needed --2 view abdominal x-ray today  2.  GERD/intermittent dysphagia --I am suspicious for esophageal stricture which was suggested at last upper endoscopy 4 years ago.  AcipHex has improved her reflux considerably over pantoprazole.  Nausea has not been an issue of late.  We discussed upper endoscopy for dilation but she wishes to defer this for now as her symptoms she does not feel are significant enough.  I encouraged her to let me know if they change and we would pursue upper endoscopy --Continue AcipHex 20 mg twice daily AC  3.  History of adenomatous colon polyps --surveillance colonoscopy due next year, 2022  I will see her in about 6 weeks for follow-up  30 minutes total spent today including patient facing time, coordination of care, reviewing medical history/procedures/pertinent radiology studies, and documentation of the encounter.

## 2019-07-03 ENCOUNTER — Ambulatory Visit (HOSPITAL_COMMUNITY): Payer: Medicare HMO

## 2019-07-03 DIAGNOSIS — Z0100 Encounter for examination of eyes and vision without abnormal findings: Secondary | ICD-10-CM | POA: Diagnosis not present

## 2019-07-04 ENCOUNTER — Inpatient Hospital Stay (HOSPITAL_COMMUNITY): Payer: Medicare HMO

## 2019-07-04 ENCOUNTER — Other Ambulatory Visit: Payer: Self-pay | Admitting: Family Medicine

## 2019-07-04 ENCOUNTER — Other Ambulatory Visit: Payer: Self-pay

## 2019-07-04 VITALS — BP 139/57 | HR 64 | Temp 97.1°F | Resp 18

## 2019-07-04 DIAGNOSIS — L299 Pruritus, unspecified: Secondary | ICD-10-CM

## 2019-07-04 DIAGNOSIS — D509 Iron deficiency anemia, unspecified: Secondary | ICD-10-CM

## 2019-07-04 DIAGNOSIS — Z79899 Other long term (current) drug therapy: Secondary | ICD-10-CM | POA: Diagnosis not present

## 2019-07-04 MED ORDER — HYDROXYZINE HCL 25 MG PO TABS: 25.0000 mg | ORAL_TABLET | Freq: Every day | ORAL | 0 refills | Status: DC | PRN

## 2019-07-04 MED ORDER — SODIUM CHLORIDE 0.9 % IV SOLN
510.0000 mg | Freq: Once | INTRAVENOUS | Status: AC
  Administered 2019-07-04: 510 mg via INTRAVENOUS
  Filled 2019-07-04: qty 510

## 2019-07-04 MED ORDER — METHYLPREDNISOLONE SODIUM SUCC 125 MG IJ SOLR
125.0000 mg | Freq: Once | INTRAMUSCULAR | Status: AC
  Administered 2019-07-04: 125 mg via INTRAVENOUS
  Filled 2019-07-04: qty 2

## 2019-07-04 MED ORDER — ONDANSETRON HCL 4 MG/2ML IJ SOLN
4.0000 mg | Freq: Once | INTRAMUSCULAR | Status: AC
  Administered 2019-07-04: 4 mg via INTRAVENOUS
  Filled 2019-07-04: qty 2

## 2019-07-04 MED ORDER — SODIUM CHLORIDE 0.9 % IV SOLN: INTRAVENOUS | Status: DC

## 2019-07-04 NOTE — Patient Instructions (Signed)
[facility]  Discharge Instructions:   _______________________________________________________________  Thank you for choosing [facility] at [facility] to provide your oncology and hematology care.  To afford each patient quality time with our providers, please arrive at least 15 minutes before your scheduled appointment.  You need to re-schedule your appointment if you arrive 10 or more minutes late.  We strive to give you quality time with our providers, and arriving late affects you and other patients whose appointments are after yours.  Also, if you no show three or more times for appointments you may be dismissed from the clinic.  Again, thank you for choosing [facility] at [facility]. Our hope is that these requests will allow you access to exceptional care and in a timely manner. _______________________________________________________________  If you have questions after your visit, please contact our office at [phone] between the hours of 8:30 a.m. and 5:00 p.m. Voicemails left after 4:30 p.m. will not be returned until the following business day. _______________________________________________________________  For prescription refill requests, have your pharmacy contact our office. _______________________________________________________________  Recommendations made by the consultant and any test results will be sent to your referring physician. _______________________________________________________________ 

## 2019-07-04 NOTE — Progress Notes (Signed)
Iron given per orders. Patient tolerated it well without problems. Vitals stable and discharged home from clinic ambulatory. Follow up as scheduled.  

## 2019-07-04 NOTE — Progress Notes (Signed)
[name] called the on call line tonight due to itching from iron transfusion and possible reaction to shingles injection (2nd) on Thursday 4/22. She reports that benadryl did not help like it normally does, possbile due to both being so close together.   Sent in atarax to take daily as needed for next several days. Advised of side effects. Patient acknowledged agreement and understanding of the plan.   Cherly Beach, NP

## 2019-07-05 ENCOUNTER — Other Ambulatory Visit: Payer: Self-pay | Admitting: Internal Medicine

## 2019-07-07 ENCOUNTER — Other Ambulatory Visit: Payer: Self-pay

## 2019-07-07 ENCOUNTER — Telehealth (INDEPENDENT_AMBULATORY_CARE_PROVIDER_SITE_OTHER): Payer: Medicare HMO

## 2019-07-07 VITALS — BP 139/57 | Ht 59.0 in | Wt 162.0 lb

## 2019-07-07 DIAGNOSIS — Z0000 Encounter for general adult medical examination without abnormal findings: Secondary | ICD-10-CM

## 2019-07-07 DIAGNOSIS — F250 Schizoaffective disorder, bipolar type: Secondary | ICD-10-CM | POA: Diagnosis not present

## 2019-07-07 DIAGNOSIS — J42 Unspecified chronic bronchitis: Secondary | ICD-10-CM | POA: Diagnosis not present

## 2019-07-07 DIAGNOSIS — D649 Anemia, unspecified: Secondary | ICD-10-CM | POA: Diagnosis not present

## 2019-07-07 DIAGNOSIS — F17210 Nicotine dependence, cigarettes, uncomplicated: Secondary | ICD-10-CM | POA: Diagnosis not present

## 2019-07-07 DIAGNOSIS — E039 Hypothyroidism, unspecified: Secondary | ICD-10-CM | POA: Diagnosis not present

## 2019-07-07 DIAGNOSIS — J449 Chronic obstructive pulmonary disease, unspecified: Secondary | ICD-10-CM | POA: Diagnosis not present

## 2019-07-07 DIAGNOSIS — M160 Bilateral primary osteoarthritis of hip: Secondary | ICD-10-CM | POA: Diagnosis not present

## 2019-07-07 DIAGNOSIS — E1142 Type 2 diabetes mellitus with diabetic polyneuropathy: Secondary | ICD-10-CM | POA: Diagnosis not present

## 2019-07-07 DIAGNOSIS — E1165 Type 2 diabetes mellitus with hyperglycemia: Secondary | ICD-10-CM | POA: Diagnosis not present

## 2019-07-07 NOTE — Progress Notes (Signed)
Subjective:   [name] is a 69 y.o. female who presents for Medicare Annual (Subsequent) preventive examination.  Review of Systems:   Cardiac Risk Factors include: advanced age (>27mn, >>23women);diabetes mellitus;dyslipidemia;hypertension;obesity (BMI >30kg/m2);sedentary lifestyle;smoking/ tobacco exposure     Objective:     Vitals: BP (!) 139/57   Ht 4' 11"  (1.499 m)   Wt 162 lb (73.5 kg)   BMI 32.72 kg/m   Body mass index is 32.72 kg/m.  Advanced Directives 07/07/2019 06/25/2019 05/21/2019 01/15/2019 12/24/2018 12/24/2018 12/13/2018  Does Patient Have a Medical Advance Directive? - No Yes No No No No  Type of Advance Directive - - HLagrangeLiving will - - - -  Does patient want to make changes to medical advance directive? - - Yes (MAU/Ambulatory/Procedural Areas - Information given) - - - -  Copy of [place] Chart? - - No - copy requested - - - -  Would patient like information on creating a medical advance directive? Yes (ED - Information included in AVS) No - Patient declined Yes (MAU/Ambulatory/Procedural Areas - Information given) No - Patient declined Yes (MAU/Ambulatory/Procedural Areas - Information given) No - Guardian declined No - Patient declined  Pre-existing out of facility DNR order (yellow form or pink MOST form) - - - - - - -    Tobacco Social History   Tobacco Use  Smoking Status Current Every Day Smoker  . Packs/day: 0.50  . Years: 30.00  . Pack years: 15.00  . Types: Cigarettes  Smokeless Tobacco Never Used  Tobacco Comment   5-10 cigs a day     Ready to quit: Not Answered Counseling given: Not Answered Comment: 5-10 cigs a day   Clinical Intake:  Pre-visit preparation completed: Yes  Pain : 0-10 Pain Score: 6  Pain Location: Back Pain Onset: More than a month ago Pain Frequency: Constant     Nutritional Status: BMI > 30  Obese  How often do you need to have someone help you when you read  instructions, pamphlets, or other written materials from your doctor or pharmacy?: 3 - Sometimes  Interpreter Needed?: No     Past Medical History:  Diagnosis Date  . Allergy   . Anemia   . Anxiety    takes Ativan daily  . Arthritis   . Assistance needed for mobility   . Bipolar disorder ([place]    takes Risperdal nightly  . Blood transfusion   . Cancer ([place]    In her gum  . Carpal tunnel syndrome of right wrist 05/23/2011  . Cervical disc disorder with radiculopathy of cervical region 10/31/2012  . Chronic back pain   . Chronic idiopathic constipation   . Chronic neck and back pain   . Colon polyps   . COPD (chronic obstructive pulmonary disease) with chronic bronchitis ([place] 09/16/2013   Office Spirometry 10/30/2013-submaximal effort based on appearance of loop and curve. Numbers would fit with severe restriction but her physiologic capability may be better than this. FVC 0.91/44%, and 10.74/45%, FEV1/FVC 0.81, FEF 25-75% 1.43/69%    . Diabetes mellitus    Type II  . Diverticulosis    TCS 9/08 by Dr. DDelfin Edisfor diarrhea . Bx for micro scopic colitis negative.   . Fibromyalgia   . Frequent falls   . GERD (gastroesophageal reflux disease)    takes Aciphex daily  . Glaucoma    eye drops daily  . Gum symptoms    infection on  antibiotic  . Hemiplegia affecting non-dominant side, post-stroke 08/02/2011  . Hiatal hernia   . Hyperlipidemia    takes Crestor daily  . Hypertension    takes Amlodipine,Metoprolol,and Clonidine daily  . Hypothyroidism    takes Synthroid daily  . IBS (irritable bowel syndrome)   . Insomnia    takes Trazodone nightly  . Major depression, recurrent ([place])    takes Zoloft daily  . 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 Metabolic encephalopathy 07/28/9840  . Migraines    chronic headaches  . Mononeuritis lower limb   . Narcolepsy   . Osteoporosis   . Pancreatitis 2006   due to Depakote with normal EUS   . Schatzki's ring    non critical / EGD  with ED 8/2011with RMR  . Seizures ([place])    takes Lamictal daily.Last seizure 3 yrs ago  . Sleep apnea    on CPAP  . Small bowel obstruction ([place])   . Stroke [facility])    left sided weakness, speech changes  . Tubular adenoma of colon    Past Surgical History:  Procedure Laterality Date  . ABDOMINAL HYSTERECTOMY  1978  . BACK SURGERY  July 2012  . BACTERIAL OVERGROWTH TEST N/A 05/05/2013   Procedure: BACTERIAL OVERGROWTH TEST;  Surgeon: Daneil Dolin, MD;  [facility];  Service: Endoscopy;  Laterality: N/A;  7:30  . BIOPSY THYROID  2009  . BRAIN SURGERY  11/2011   resection of meningioma  . BREAST REDUCTION SURGERY  1994  . CARDIAC CATHETERIZATION  05/10/2005   normal coronaries, normal LV systolic function and EF (Dr. Jackie Plum)  . CARPAL TUNNEL RELEASE Left 07/22/04   Dr. Aline Brochure  . CATARACT EXTRACTION Bilateral   . CHOLECYSTECTOMY  1984  . COLONOSCOPY N/A 09/25/2012   [id number] diverticulosis.  colonic polyp-removed : tubular adenoma  . CRANIOTOMY  11/23/2011   Procedure: CRANIOTOMY TUMOR EXCISION;  Surgeon: Hosie Spangle, MD;  Location: [place] [facility];  Service: Neurosurgery;  Laterality: N/A;  Craniotomy for tumor resection  . ESOPHAGOGASTRODUODENOSCOPY  12/29/2010   Rourk-Retained food in the esophagus and stomach, small hiatal hernia, status post Maloney dilation of the esophagus  . ESOPHAGOGASTRODUODENOSCOPY N/A 09/25/2012   [id number] atonic baggy esophagus status post Maloney dilation 51 F. Hiatal hernia  . GIVENS CAPSULE STUDY N/A 01/15/2013   NORMAL.   . IR GENERIC HISTORICAL  03/17/2016   IR RADIOLOGIST EVAL & MGMT 03/17/2016 [facility]-INTER[facility] RAD  . LESION REMOVAL N/A 05/31/2015   Procedure: REMOVAL RIGHT AND LEFT LESIONS OF MANDIBLE;  Surgeon: Diona Browner, DDS;  Location: [place];  Service: Oral Surgery;  Laterality: N/A;  . MALONEY DILATION  12/29/2010   RMR;  . NM MYOCAR PERF WALL MOTION  2006   "relavtiely normal" persantine, mild anterior thinning (breast  attenuation artifact), no region of scar/ischemia  . OVARIAN CYST REMOVAL    . RECTOCELE REPAIR N/A 06/29/2015   Procedure: POSTERIOR REPAIR (RECTOCELE);  Surgeon: Jonnie Kind, MD;  Location: [facility];  Service: Gynecology;  Laterality: N/A;  . REDUCTION MAMMAPLASTY Bilateral   . SPINE SURGERY  09/29/2010   Dr. Rolena Infante  . surgical excision of 3 tumors from right thigh and right buttock  and left upper thigh  2010  . TOOTH EXTRACTION Bilateral 12/14/2014   Procedure: REMOVAL OF BILATERAL MANDIBULAR EXOSTOSES;  Surgeon: Diona Browner, DDS;  Location: [facility];  Service: Oral Surgery;  Laterality: Bilateral;  . TRANSTHORACIC ECHOCARDIOGRAM  2010   EF 60-65%, mild conc LVH, grade 1  diastolic dysfunction; mildly calcified MV annulus with mildly thickened leaflets, mildly calcified MR annulus   Family History  Problem Relation Age of Onset  . Heart attack Mother        HTN  . Pneumonia Father   . Kidney failure Father   . Diabetes Father   . Pancreatic cancer Sister   . Diabetes Brother   . Hypertension Brother   . Diabetes Brother   . Cancer Sister        breast   . Hypertension Son   . Sleep apnea Son   . Cancer Sister        pancreatic  . Stroke Maternal Grandmother   . Heart attack Maternal Grandfather   . Alcohol abuse Maternal Uncle   . Colon cancer Neg Hx   . Anesthesia problems Neg Hx   . Hypotension Neg Hx   . Malignant hyperthermia Neg Hx   . Pseudochol deficiency Neg Hx   . Breast cancer Neg Hx    Social History   Socioeconomic History  . Marital status: Divorced    Spouse name: Not on file  . Number of children: 1  . Years of education: 50  . Highest education level: High school graduate  Occupational History  . Occupation: Disabled  Tobacco Use  . Smoking status: Current Every Day Smoker    Packs/day: 0.50    Years: 30.00    Pack years: 15.00    Types: Cigarettes  . Smokeless tobacco: Never Used  . Tobacco comment: 5-10 cigs a day  Substance and Sexual  Activity  . Alcohol use: No    Alcohol/week: 0.0 standard drinks    Comment:    . Drug use: No  . Sexual activity: Not Currently  Other Topics Concern  . Not on file  Social History Narrative   01/29/18 Lives alone, has 3 aides, Mon- Fri 8 hrs, 2 hrs on Sat-Sun, RN manages her meds   Caffeine use: Drink coffee sometimes    Right handed    Social Determinants of Health   Financial Resource Strain: Low Risk   . Difficulty of Paying Living Expenses: Not very hard  Food Insecurity: No Food Insecurity  . Worried About Charity fundraiser in the Last Year: Never true  . Ran Out of Food in the Last Year: Never true  Transportation Needs: No Transportation Needs  . Lack of Transportation (Medical): No  . Lack of Transportation (Non-Medical): No  Physical Activity: Inactive  . Days of Exercise per Week: 0 days  . Minutes of Exercise per Session: 0 min  Stress: Stress Concern Present  . Feeling of Stress : To some extent  Social Connections: Somewhat Isolated  . Frequency of Communication with Friends and Family: More than three times a week  . Frequency of Social Gatherings with Friends and Family: More than three times a week  . Attends Religious Services: More than 4 times per year  . Active Member of Clubs or Organizations: No  . Attends Archivist Meetings: Never  . Marital Status: Divorced    Outpatient Encounter Medications as of 07/07/2019  Medication Sig  . ACCU-CHEK AVIVA PLUS test strip CHECK BLOOD SUGAR FOUR TIMES DAILY  . albuterol (VENTOLIN HFA) 108 (90 Base) MCG/ACT inhaler Inhale 2 puffs into the lungs every 6 (six) hours as needed for wheezing or shortness of breath.  [name] alendronate (FOSAMAX) 70 MG tablet Take 1 tablet (70 mg total) by mouth every 7 (seven) days. Take  with a full glass of water on an empty stomach.  [name] amLODipine (NORVASC) 10 MG tablet TAKE 1 TABLET EVERY DAY  . ascorbic acid (VITAMIN C) 500 MG tablet Take 500 mg by mouth daily.  [name] aspirin EC  81 MG tablet Take 1 tablet (81 mg total) by mouth daily with breakfast.  . blood glucose meter kit and supplies Dispense based on patient and insurance preference. Use up to four times daily as directed. (FOR ICD-10 E10.9, E11.9).  [name] buPROPion (WELLBUTRIN XL) 150 MG 24 hr tablet Take 1 tablet (150 mg total) by mouth every morning.  . cetirizine (ZYRTEC) 10 MG tablet Take 1 tablet (10 mg total) by mouth daily.  . clobetasol cream (TEMOVATE) 0.09 % Apply 1 application topically 2 (two) times daily.   . Continuous Blood Gluc Sensor (FREESTYLE LIBRE 14 DAY SENSOR) MISC 1 each by Does not apply route every 14 (fourteen) days. Change every 2 weeks  . cromolyn (NASALCROM) 5.2 MG/ACT nasal spray Place 1 spray into both nostrils 4 (four) times daily.  . diclofenac sodium (VOLTAREN) 1 % GEL APPLY 2 GRAMS  TOPICALLY 4 (FOUR) TIMES DAILY.  [name] diclofenac Sodium (VOLTAREN) 1 % GEL APPLY 2 GRAMS  TOPICALLY 4 (FOUR) TIMES DAILY.  [name] dicyclomine (BENTYL) 10 MG capsule Take 1 capsule (10 mg total) by mouth 3 (three) times daily as needed for spasms.  [name] ezetimibe (ZETIA) 10 MG tablet Take 1 tablet (10 mg total) by mouth daily.  . hydrOXYzine (ATARAX/VISTARIL) 25 MG tablet Take 1 tablet (25 mg total) by mouth daily as needed for itching.  . insulin aspart (NOVOLOG FLEXPEN) 100 UNIT/ML FlexPen INJECT 10 TO 14 UNITS INTO THE SKIN 3 (THREE) TIMES DAILY WITH MEALS.  [name] Insulin Glargine, 2 Unit Dial, (TOUJEO MAX SOLOSTAR) 300 UNIT/ML SOPN Inject 25 Units into the skin at bedtime.  . lamoTRIgine (LAMICTAL) 100 MG tablet TAKE 1 TABLET TWICE DAILY  . Lancets (ACCU-CHEK SOFT TOUCH) lancets Use as instructed to check blood sugar 3 times a day.  . levothyroxine (SYNTHROID) 50 MCG tablet TAKE 1 TABLET DAILY, EXCEPT TAKE 1/2 TABLET ON SUNDAY  . linaclotide (LINZESS) 72 MCG capsule Take 1 capsule (72 mcg total) by mouth daily before breakfast. Pharmacy-please d/c linzess 145 mcg dosage  . LORazepam (ATIVAN) 0.5 MG tablet Take 1 tablet  (0.5 mg total) by mouth 3 (three) times daily.  [name] losartan (COZAAR) 50 MG tablet TAKE 1 TABLET EVERY DAY  . lubiprostone (AMITIZA) 24 MCG capsule Take 1 capsule (24 mcg total) by mouth 2 (two) times daily with a meal.  . metoprolol tartrate (LOPRESSOR) 50 MG tablet TAKE 1 TABLET TWICE DAILY (NEED MD APPOINTMENT)  . montelukast (SINGULAIR) 10 MG tablet TAKE 1 TABLET EVERY DAY  . NEOMYCIN-POLYMYXIN-HC, OPHTH, SUSP neomycin 3.5 mg-polymyxin 10,000 unit-hydrocort 10 mg/mL eye drop,susp  . nystatin (MYCOSTATIN/NYSTOP) powder APPLY TO AFFECTED AREA 4 TIMES DAILY.  [name] Omega-3 Fatty Acids (FISH OIL PO) Take 1 capsule by mouth daily.  . ondansetron (ZOFRAN) 4 MG tablet Take 1 tablet (4 mg total) by mouth every 8 (eight) hours as needed for nausea or vomiting.  [name] oxyCODONE-acetaminophen (PERCOCET) 10-325 MG tablet Take 1 tablet by mouth every 8 (eight) hours as needed.   . pregabalin (LYRICA) 75 MG capsule Take 1 capsule (75 mg total) by mouth daily.  . RABEprazole (ACIPHEX) 20 MG tablet TAKE 1 TABLET TWICE DAILY  . RESTASIS 0.05 % ophthalmic emulsion Place 1 drop into both eyes 2 (two) times daily.   [name]  risperiDONE (RISPERDAL) 0.5 MG tablet Take 1 tablet (0.5 mg total) by mouth at bedtime.  . rosuvastatin (CRESTOR) 5 MG tablet TAKE 1 TABLET AT BEDTIME  . sertraline (ZOLOFT) 100 MG tablet Take 1 tablet (100 mg total) by mouth daily.  [name] SHINGRIX injection   . STIOLTO RESPIMAT 2.5-2.5 MCG/ACT AERS INHALE 2 PUFFS INTO THE LUNGS DAILY.  . traZODone (DESYREL) 100 MG tablet Take 1 tablet (100 mg total) by mouth at bedtime.   No facility-administered encounter medications on file as of 07/07/2019.    Activities of Daily Living In your present state of health, do you have any difficulty performing the following activities: 07/07/2019 05/21/2019  Hearing? N Y  Comment - Hard of hearing.  Vision? N N  Difficulty concentrating or making decisions? Y N  Walking or climbing stairs? Y Y  Comment - Unsteady gait,  impaired mobility, weakness.  Dressing or bathing? Y Y  Comment - Unsteady gait, impaired mobility, weakness.  Doing errands, shopping? Y Y  Comment - Unsteady gait, impaired mobility, weakness.  Preparing Food and eating ? N Y  Comment - Unsteady gait, impaired mobility, weakness.  Using the Toilet? N Y  Comment - Unsteady gait, impaired mobility, weakness.  In the past six months, have you accidently leaked urine? N N  Do you have problems with loss of bowel control? N N  Managing your Medications? Y N  Managing your Finances? Y N  Housekeeping or managing your Housekeeping? Y Y  Comment - Patient has an in-home caregiver.  Some recent data might be hidden    Patient Care Team: [name], MD as PCP - General Herminio Commons, MD as PCP - Cardiology (Cardiology) Gala Romney Cristopher Estimable, MD (Gastroenterology) Deneise Lever, MD as Consulting Physician (Pulmonary Disease) Jovita Gamma, MD as Consulting Physician (Neurosurgery) Suella Broad, MD as Consulting Physician (Physical Medicine and Rehabilitation) Pieter Partridge, DO as Consulting Physician (Neurology) Lavonna Monarch, MD as Consulting Physician (Dermatology) Leta Baptist, MD as Consulting Physician (Otolaryngology) Latanya Maudlin, MD as Consulting Physician (Orthopedic Surgery) Cloria Spring, MD as Consulting Physician ([place]) Melina Schools, MD as Consulting Physician (Orthopedic Surgery) Lendon Colonel, NP as Nurse Practitioner (Nurse Practitioner) Milus Banister, MD as Attending Physician (Gastroenterology) Pyrtle, Lajuan Lines, MD as Consulting Physician (Gastroenterology) Monico Hoar, PT as Physical Therapist (Physical Therapy) Conrad Palisade, MD as Consulting Physician (Vascular Surgery) Inocencio Homes, DPM as Consulting Physician (Podiatry) Newt Minion, MD as Consulting Physician (Orthopedic Surgery) Jon Billings, RN as [place] [facility]    Assessment:   This is  a routine wellness examination for [place].  Exercise Activities and Dietary recommendations Current Exercise Habits: The patient does not participate in regular exercise at present, Exercise limited by: orthopedic condition(s);respiratory conditions(s)  Goals    . Exercise 3x per week (30 min per time)     Recommend starting chair exercises at least 3 days a week for 30-45 minutes at a time as tolerated.      . Prevent falls     Prevent falls     . Quit smoking / using tobacco       Fall Risk Fall Risk  07/07/2019 05/21/2019 04/21/2019 02/03/2019 01/15/2019  Falls in the past year? 1 0 1 1 0  Comment - - - - -  Number falls in past yr: 1 0 1 1 -  Comment - - - - -  Injury with Fall? 1 0 1 1 -  Comment - - - - -  Risk Factor Category  - - - - -  Risk for fall due to : - History of fall(s);Impaired balance/gait;Impaired mobility - - -  Follow up - Education provided;Falls prevention discussed - - -   Is the patient's home free of loose throw rugs in walkways, pet beds, electrical cords, etc?   yes      Grab bars in the bathroom? yes      Handrails on the stairs?   yes      Adequate lighting?   yes  Timed Get Up and Go performed: unable to perform   Depression Screen PHQ 2/9 Scores 07/07/2019 05/21/2019 05/13/2019 04/21/2019  PHQ - 2 Score 2 2 2 2   PHQ- 9 Score 13 5 8 3   Some encounter information is confidential and restricted. Go to Review Flowsheets activity to see all data.     Cognitive Function MMSE - Mini Mental State Exam 02/03/2019 03/20/2016 09/08/2015 07/02/2014 12/08/2013  Not completed: - (No Data) - - -  Orientation to time 5 - 5 5 5   Orientation to Place 5 - 3 5 5   Registration 3 - 2 3 2   Attention/ Calculation 5 - 5 3 5   Recall 1 - 2 3 2   Language- name 2 objects 2 - 2 2 2   Language- repeat 1 - 0 1 1  Language- follow 3 step command 3 - 3 3 3   Language- read & follow direction 1 - 1 1 1   Write a sentence 1 - 1 0 1  Copy design 0 - 0 0 0  Total score 27 - 24 26 27       6CIT Screen 07/07/2019 06/12/2018 01/31/2018 12/06/2016  What Year? 0 points 0 points 0 points 0 points  What month? 0 points 0 points 0 points 0 points  What time? 0 points 0 points 0 points 0 points  Count back from 20 0 points 0 points 0 points 0 points  Months in reverse 0 points 0 points 0 points 0 points  Repeat phrase 0 points 4 points 0 points 0 points  Total Score 0 4 0 0    Immunization History  Administered Date(s) Administered  . Fluad Quad(high Dose 65+) 01/01/2019  . H1N1 01/09/2008  . Influenza Split 12/12/2010, 11/16/2011  . Influenza Whole 11/23/2006, 12/10/2007, 12/11/2008, 11/16/2009  . Influenza,inj,Quad PF,6+ Mos 11/28/2012, 11/20/2013, 11/10/2014, 12/06/2015, 11/21/2016, 11/20/2017  . Influenza,inj,quad, With Preservative 12/11/2016, 01/07/2018  . Moderna SARS-COVID-2 Vaccination 04/27/2019, 05/25/2019  . Pneumococcal Conjugate-13 09/16/2013  . Pneumococcal Polysaccharide-23 08/03/2003, 12/11/2008, 08/22/2010, 08/31/2015  . Td 09/23/2003  . Tdap 12/29/2011    Qualifies for Shingles Vaccine? Qualifies- can check pharmacy   Screening Tests Health Maintenance  Topic Date Due  . OPHTHALMOLOGY EXAM  05/01/2019  . INFLUENZA VACCINE  10/12/2019  . HEMOGLOBIN A1C  12/24/2019  . FOOT EXAM  01/03/2020  . MAMMOGRAM  11/24/2020  . COLONOSCOPY  02/09/2021  . TETANUS/TDAP  12/28/2021  . DEXA SCAN  Completed  . COVID-19 Vaccine  Completed  . Hepatitis C Screening  Completed  . PNA vac Low Risk Adult  Completed    Cancer Screenings: Lung: Low Dose CT Chest recommended if Age 60-80 years, 30 pack-year currently smoking OR have quit w/in 15years. Patient does not qualify. Breast:  Up to date on Mammogram? Yes   Up to date of Bone Density/Dexa? Yes Colorectal: up to date   Additional Screenings:  Hepatitis C Screening: completed  Plan:     I have personally reviewed and noted the following in the patient's chart:   . Medical and social  history . Use of alcohol, tobacco or illicit drugs  . Current medications and supplements . Functional ability and status . Nutritional status . Physical activity . Advanced directives . List of other physicians . Hospitalizations, surgeries, and ER visits in previous 12 months . Vitals . Screenings to include cognitive, depression, and falls . Referrals and appointments  In addition, I have reviewed and discussed with patient certain preventive protocols, quality metrics, and best practice recommendations. A written personalized care plan for preventive services as well as general preventive health recommendations were provided to patient.     Kate Sable, LPN, LPN  6/75/1982

## 2019-07-07 NOTE — Patient Instructions (Signed)
[name] , Thank you for taking time to come for your Medicare Wellness Visit. I appreciate your ongoing commitment to your health goals. Please review the following plan we discussed and let me know if I can assist you in the future.   Screening recommendations/referrals: Colonoscopy: up to date  Mammogram: up to date  Bone Density: up to date Recommended yearly ophthalmology/optometry visit for glaucoma screening and checkup Recommended yearly dental visit for hygiene and checkup  Vaccinations: Influenza vaccine: up to date  Pneumococcal vaccine: up to date  Tdap vaccine: up to date  Shingles vaccine: qualifies- needs to get at pharmacy   Advanced directives: mailed  Conditions/risks identified: nicotine use, fall risk   Next appointment: 1 year    Preventive Care 33 Years and Older, Female Preventive care refers to lifestyle choices and visits with your health care provider that can promote health and wellness. What does preventive care include?  A yearly physical exam. This is also called an annual well check.  Dental exams once or twice a year.  Routine eye exams. Ask your health care provider how often you should have your eyes checked.  Personal lifestyle choices, including:  Daily care of your teeth and gums.  Regular physical activity.  Eating a healthy diet.  Avoiding tobacco and drug use.  Limiting alcohol use.  Practicing safe sex.  Taking low-dose aspirin every day.  Taking vitamin and mineral supplements as recommended by your health care provider. What happens during an annual well check? The services and screenings done by your health care provider during your annual well check will depend on your age, overall health, lifestyle risk factors, and family history of disease. Counseling  Your health care provider may ask you questions about your:  Alcohol use.  Tobacco use.  Drug use.  Emotional well-being.  Home and relationship  well-being.  Sexual activity.  Eating habits.  History of falls.  Memory and ability to understand (cognition).  Work and work Statistician.  Reproductive health. Screening  You may have the following tests or measurements:  Height, weight, and BMI.  Blood pressure.  Lipid and cholesterol levels. These may be checked every 5 years, or more frequently if you are over 78 years old.  Skin check.  Lung cancer screening. You may have this screening every year starting at age 98 if you have a 30-pack-year history of smoking and currently smoke or have quit within the past 15 years.  Fecal occult blood test (FOBT) of the stool. You may have this test every year starting at age 38.  Flexible sigmoidoscopy or colonoscopy. You may have a sigmoidoscopy every 5 years or a colonoscopy every 10 years starting at age 47.  Hepatitis C blood test.  Hepatitis B blood test.  Sexually transmitted disease (STD) testing.  Diabetes screening. This is done by checking your blood sugar (glucose) after you have not eaten for a while (fasting). You may have this done every 1-3 years.  Bone density scan. This is done to screen for osteoporosis. You may have this done starting at age 19.  Mammogram. This may be done every 1-2 years. Talk to your health care provider about how often you should have regular mammograms. Talk with your health care provider about your test results, treatment options, and if necessary, the need for more tests. Vaccines  Your health care provider may recommend certain vaccines, such as:  Influenza vaccine. This is recommended every year.  Tetanus, diphtheria, and acellular pertussis (Tdap, Td)  vaccine. You may need a Td booster every 10 years.  Zoster vaccine. You may need this after age 34.  Pneumococcal 13-valent conjugate (PCV13) vaccine. One dose is recommended after age 17.  Pneumococcal polysaccharide (PPSV23) vaccine. One dose is recommended after age  36. Talk to your health care provider about which screenings and vaccines you need and how often you need them. This information is not intended to replace advice given to you by your health care provider. Make sure you discuss any questions you have with your health care provider. Document Released: 03/26/2015 Document Revised: 11/17/2015 Document Reviewed: 12/29/2014 Elsevier Interactive Patient Education  2017 [place] Prevention in the Home Falls can cause injuries. They can happen to people of all ages. There are many things you can do to make your home safe and to help prevent falls. What can I do on the outside of my home?  Regularly fix the edges of walkways and driveways and fix any cracks.  Remove anything that might make you trip as you walk through a door, such as a raised step or threshold.  Trim any bushes or trees on the path to your home.  Use bright outdoor lighting.  Clear any walking paths of anything that might make someone trip, such as rocks or tools.  Regularly check to see if handrails are loose or broken. Make sure that both sides of any steps have handrails.  Any raised decks and porches should have guardrails on the edges.  Have any leaves, snow, or ice cleared regularly.  Use sand or salt on walking paths during winter.  Clean up any spills in your garage right away. This includes oil or grease spills. What can I do in the bathroom?  Use night lights.  Install grab bars by the toilet and in the tub and shower. Do not use towel bars as grab bars.  Use non-skid mats or decals in the tub or shower.  If you need to sit down in the shower, use a plastic, non-slip stool.  Keep the floor dry. Clean up any water that spills on the floor as soon as it happens.  Remove soap buildup in the tub or shower regularly.  Attach bath mats securely with double-sided non-slip rug tape.  Do not have throw rugs and other things on the floor that can make  you trip. What can I do in the bedroom?  Use night lights.  Make sure that you have a light by your bed that is easy to reach.  Do not use any sheets or blankets that are too big for your bed. They should not hang down onto the floor.  Have a firm chair that has side arms. You can use this for support while you get dressed.  Do not have throw rugs and other things on the floor that can make you trip. What can I do in the kitchen?  Clean up any spills right away.  Avoid walking on wet floors.  Keep items that you use a lot in easy-to-reach places.  If you need to reach something above you, use a strong step stool that has a grab bar.  Keep electrical cords out of the way.  Do not use floor polish or wax that makes floors slippery. If you must use wax, use non-skid floor wax.  Do not have throw rugs and other things on the floor that can make you trip. What can I do with my stairs?  Do not leave  any items on the stairs.  Make sure that there are handrails on both sides of the stairs and use them. Fix handrails that are broken or loose. Make sure that handrails are as long as the stairways.  Check any carpeting to make sure that it is firmly attached to the stairs. Fix any carpet that is loose or worn.  Avoid having throw rugs at the top or bottom of the stairs. If you do have throw rugs, attach them to the floor with carpet tape.  Make sure that you have a light switch at the top of the stairs and the bottom of the stairs. If you do not have them, ask someone to add them for you. What else can I do to help prevent falls?  Wear shoes that:  Do not have high heels.  Have rubber bottoms.  Are comfortable and fit you well.  Are closed at the toe. Do not wear sandals.  If you use a stepladder:  Make sure that it is fully opened. Do not climb a closed stepladder.  Make sure that both sides of the stepladder are locked into place.  Ask someone to hold it for you, if  possible.  Clearly mark and make sure that you can see:  Any grab bars or handrails.  First and last steps.  Where the edge of each step is.  Use tools that help you move around (mobility aids) if they are needed. These include:  Canes.  Walkers.  Scooters.  Crutches.  Turn on the lights when you go into a dark area. Replace any light bulbs as soon as they burn out.  Set up your furniture so you have a clear path. Avoid moving your furniture around.  If any of your floors are uneven, fix them.  If there are any pets around you, be aware of where they are.  Review your medicines with your doctor. Some medicines can make you feel dizzy. This can increase your chance of falling. Ask your doctor what other things that you can do to help prevent falls. This information is not intended to replace advice given to you by your health care provider. Make sure you discuss any questions you have with your health care provider. Document Released: 12/24/2008 Document Revised: 08/05/2015 Document Reviewed: 04/03/2014 Elsevier Interactive Patient Education  2017 [employer].

## 2019-07-08 ENCOUNTER — Telehealth (INDEPENDENT_AMBULATORY_CARE_PROVIDER_SITE_OTHER): Payer: Medicare HMO | Admitting: Psychiatry

## 2019-07-08 ENCOUNTER — Other Ambulatory Visit: Payer: Self-pay

## 2019-07-08 DIAGNOSIS — F331 Major depressive disorder, recurrent, moderate: Secondary | ICD-10-CM

## 2019-07-08 NOTE — Progress Notes (Signed)
Virtual Visit via Telephone Note  I connected with Dmaya Khatib Morency on 07/08/19 at 2:15 PM by telephone and verified that I am speaking with the correct person using two identifiers.   I discussed the limitations, risks, security and privacy concerns of performing an evaluation and management service by telephone and the availability of in person appointments. I also discussed with the patient that there may be a patient responsible charge related to this service. The patient expressed understanding and agreed to proceed.   I provided 50 minutes of non-face-to-face time during this encounter.   [name], LCSW             THERAPIST PROGRESS NOTE  Session Time: Tuesday 07/08/2019 2:15 PM -  3:05 PM    Participation Level: Active  Behavioral Response:  depressed, anxious  Type of Therapy: Individual Therapy  Treatment Goals addressed: Learn and implement behavioral strategies to overcome depression.                                                       Verbalize an understanding and resolution of current interpersonal problems.   Interventions: CBT and Supportive   Summary: Brittini Haymer Proano is a 69 y.o. female who presents with  long standing history of recurrent periods  of depression beginning when she was 44 and her favorite uncle died. Patient reports multiple psychiatric hospitlaizations due to depression and suicidal ideations with the last one occuring in 1997. Patient has participated in outpatient psychotherapy and medication management intermittently since age 31. She currently is seeing psychiatrist Dr. Harrington Challenger . Prior to this, she was seen at [facility]. Patient also has had ECT at [facility]. Symptoms have worsened in recent months due to family stress and have  included depressed mood, anxiety, excessive worry, and tearfulness.         Patient's last contact was by virtual visit via telephone about  3- 4 weeks ago. She continues to experience moderate symptoms of depression.   She reports having fleeting SI a few weeks ago triggered by chronic pain especially in her arm and shoulder.  She denied any plan or intent to harm herself says she called a friend.  She reports this was very helpful and says she listening to music after that phone call.  She continues to experience sadness regarding her health as daughter recently informed her she is not able to have surgery due to concerns she would not wake up if she was put to sleep for surgery.  Patient reports this because sadness and frustration as well as worry about other health issues that may arise and could only be addressed through surgery.  She continues to express frustration regarding her son and reports that he had recent conflict regarding her grandson.  She is pleased she and her grand son now have a close relationship.  She resumed attending church 2 weeks ago and has been going to church for the last 2 Sundays.  She reports enjoying the service but expresses some frustration regarding getting into the church [employer] as the process causes more pain.  She continues to express frustration regarding her aide but expresses less anger and reports pausing before interacting with aide.    Suicidal/Homicidal: Nowithout intent/plan  Therapist Response: Reviewed symptoms, discussed stressors, facilitated expression of thoughts and feelings, validated feelings,  praised and reinforced patient's contact with a friend when having fleeting SI, assisted patient identify triggers of increased depressed mood, assisted patient identify levels of pain using high medium and low along with identifying activities she can pursue at each level to help increase patient's belief in her ability to cope with pain, praised and reinforced patient's increased social involvement, encouraged patient to nurture relationships with people in her life who are supportive ,  Plan: Return again in 2 weeks.  Diagnosis: Axis I: MDD, Recurrent, moderate    Axis II:  Deferred    Martavion Couper E [place] Behlke, LCSW

## 2019-07-09 ENCOUNTER — Other Ambulatory Visit: Payer: Self-pay | Admitting: Oral Surgery

## 2019-07-09 DIAGNOSIS — D1039 Benign neoplasm of other parts of mouth: Secondary | ICD-10-CM | POA: Diagnosis not present

## 2019-07-09 DIAGNOSIS — D490 Neoplasm of unspecified behavior of digestive system: Secondary | ICD-10-CM | POA: Diagnosis not present

## 2019-07-10 ENCOUNTER — Ambulatory Visit: Payer: Medicare HMO | Admitting: Internal Medicine

## 2019-07-10 DIAGNOSIS — F250 Schizoaffective disorder, bipolar type: Secondary | ICD-10-CM | POA: Diagnosis not present

## 2019-07-10 DIAGNOSIS — E1165 Type 2 diabetes mellitus with hyperglycemia: Secondary | ICD-10-CM | POA: Diagnosis not present

## 2019-07-10 DIAGNOSIS — J42 Unspecified chronic bronchitis: Secondary | ICD-10-CM | POA: Diagnosis not present

## 2019-07-10 DIAGNOSIS — D649 Anemia, unspecified: Secondary | ICD-10-CM | POA: Diagnosis not present

## 2019-07-10 DIAGNOSIS — F17210 Nicotine dependence, cigarettes, uncomplicated: Secondary | ICD-10-CM | POA: Diagnosis not present

## 2019-07-10 DIAGNOSIS — E1142 Type 2 diabetes mellitus with diabetic polyneuropathy: Secondary | ICD-10-CM | POA: Diagnosis not present

## 2019-07-10 DIAGNOSIS — E039 Hypothyroidism, unspecified: Secondary | ICD-10-CM | POA: Diagnosis not present

## 2019-07-10 DIAGNOSIS — M160 Bilateral primary osteoarthritis of hip: Secondary | ICD-10-CM | POA: Diagnosis not present

## 2019-07-10 DIAGNOSIS — J449 Chronic obstructive pulmonary disease, unspecified: Secondary | ICD-10-CM | POA: Diagnosis not present

## 2019-07-12 DIAGNOSIS — I10 Essential (primary) hypertension: Secondary | ICD-10-CM | POA: Diagnosis not present

## 2019-07-12 DIAGNOSIS — J449 Chronic obstructive pulmonary disease, unspecified: Secondary | ICD-10-CM | POA: Diagnosis not present

## 2019-07-13 DIAGNOSIS — R32 Unspecified urinary incontinence: Secondary | ICD-10-CM | POA: Diagnosis not present

## 2019-07-16 ENCOUNTER — Other Ambulatory Visit: Payer: Self-pay

## 2019-07-16 ENCOUNTER — Ambulatory Visit
Admission: EM | Admit: 2019-07-16 | Discharge: 2019-07-16 | Disposition: A | Discharge status: Home / Self Care | Payer: Medicare HMO | Attending: Emergency Medicine | Admitting: Emergency Medicine

## 2019-07-16 ENCOUNTER — Encounter: Payer: Self-pay | Admitting: Emergency Medicine

## 2019-07-16 DIAGNOSIS — K068 Other specified disorders of gingiva and edentulous alveolar ridge: Secondary | ICD-10-CM

## 2019-07-16 DIAGNOSIS — Z9181 History of falling: Secondary | ICD-10-CM | POA: Diagnosis not present

## 2019-07-16 DIAGNOSIS — M179 Osteoarthritis of knee, unspecified: Secondary | ICD-10-CM | POA: Diagnosis not present

## 2019-07-16 DIAGNOSIS — I10 Essential (primary) hypertension: Secondary | ICD-10-CM | POA: Diagnosis not present

## 2019-07-16 MED ORDER — KETOROLAC TROMETHAMINE 60 MG/2ML IM SOLN
60.0000 mg | Freq: Once | INTRAMUSCULAR | Status: AC
  Administered 2019-07-16: 60 mg via INTRAMUSCULAR

## 2019-07-16 MED ORDER — CLINDAMYCIN HCL 300 MG PO CAPS: 300.0000 mg | ORAL_CAPSULE | Freq: Four times a day (QID) | ORAL | 0 refills | Status: AC

## 2019-07-16 MED ORDER — CHLORHEXIDINE GLUCONATE 0.12 % MT SOLN
15.0000 mL | Freq: Two times a day (BID) | OROMUCOSAL | 0 refills | Status: DC
Start: 2019-07-16 — End: 2020-01-27

## 2019-07-16 NOTE — ED Triage Notes (Signed)
Pt here for left lower gum pain after having biopsy 1 week ago; pt sts was on antibiotics but still having pain

## 2019-07-16 NOTE — ED Provider Notes (Signed)
[place]   [id number] 07/16/19 Arrival Time: 1612  CC: Gum pain  SUBJECTIVE:  [name] is a 69 y.o. female who presents with LT lower gum pain x 1 week.  Patient had gum bx 1 week ago.  Has tried OTC analgesics and percocet without relief.  Worse to the touch.  Has appt with surgeon next week.  Denies similar symptoms in the past.  Denies fever, chills, dysphagia, odynophagia, oral or neck swelling, nausea, vomiting, chest pain, SOB.    ROS: As per HPI.  All other pertinent ROS negative.     Past Medical History:  Diagnosis Date  . Allergy   . Anemia   . Anxiety    takes Ativan daily  . Arthritis   . Assistance needed for mobility   . Bipolar disorder ([place])    takes Risperdal nightly  . Blood transfusion   . Cancer ([place])    In her gum  . Carpal tunnel syndrome of right wrist 05/23/2011  . Cervical disc disorder with radiculopathy of cervical region 10/31/2012  . Chronic back pain   . Chronic idiopathic constipation   . Chronic neck and back pain   . Colon polyps   . COPD (chronic obstructive pulmonary disease) with chronic bronchitis ([place]) 09/16/2013   Office Spirometry 10/30/2013-submaximal effort based on appearance of loop and curve. Numbers would fit with severe restriction but her physiologic capability may be better than this. FVC 0.91/44%, and 10.74/45%, FEV1/FVC 0.81, FEF 25-75% 1.43/69%    . Diabetes mellitus    Type II  . Diverticulosis    TCS 9/08 by Dr. Delfin Edis for diarrhea . Bx for micro scopic colitis negative.   . Fibromyalgia   . Frequent falls   . GERD (gastroesophageal reflux disease)    takes Aciphex daily  . Glaucoma    eye drops daily  . Gum symptoms    infection on antibiotic  . Hemiplegia affecting non-dominant side, post-stroke 08/02/2011  . Hiatal hernia   . Hyperlipidemia    takes Crestor daily  . Hypertension    takes Amlodipine,Metoprolol,and Clonidine daily  . Hypothyroidism    takes Synthroid daily  . IBS (irritable  bowel syndrome)   . Insomnia    takes Trazodone nightly  . Major depression, recurrent ([place])    takes Zoloft daily  . 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 Metabolic encephalopathy 05/18/6576  . Migraines    chronic headaches  . Mononeuritis lower limb   . Narcolepsy   . Osteoporosis   . Pancreatitis 2006   due to Depakote with normal EUS   . Schatzki's ring    non critical / EGD with ED 8/2011with RMR  . Seizures ([place])    takes Lamictal daily.Last seizure 3 yrs ago  . Sleep apnea    on CPAP  . Small bowel obstruction ([place])   . Stroke [facility])    left sided weakness, speech changes  . Tubular adenoma of colon    Past Surgical History:  Procedure Laterality Date  . ABDOMINAL HYSTERECTOMY  1978  . BACK SURGERY  July 2012  . BACTERIAL OVERGROWTH TEST N/A 05/05/2013   Procedure: BACTERIAL OVERGROWTH TEST;  Surgeon: Daneil Dolin, MD;  [facility];  Service: Endoscopy;  Laterality: N/A;  7:30  . BIOPSY THYROID  2009  . BRAIN SURGERY  11/2011   resection of meningioma  . BREAST REDUCTION SURGERY  1994  . CARDIAC CATHETERIZATION  05/10/2005   normal coronaries, normal LV systolic  function and EF (Dr. Jackie Plum)  . CARPAL TUNNEL RELEASE Left 07/22/04   Dr. Aline Brochure  . CATARACT EXTRACTION Bilateral   . CHOLECYSTECTOMY  1984  . COLONOSCOPY N/A 09/25/2012   [id number] diverticulosis.  colonic polyp-removed : tubular adenoma  . CRANIOTOMY  11/23/2011   Procedure: CRANIOTOMY TUMOR EXCISION;  Surgeon: Hosie Spangle, MD;  Location: [facility]URO ORS;  Service: Neurosurgery;  Laterality: N/A;  Craniotomy for tumor resection  . ESOPHAGOGASTRODUODENOSCOPY  12/29/2010   [place]-Retained food in the esophagus and stomach, small hiatal hernia, status post Maloney dilation of the esophagus  . ESOPHAGOGASTRODUODENOSCOPY N/A 09/25/2012   WEX:[id number] atonic baggy esophagus status post Maloney dilation 48 F. Hiatal hernia  . GIVENS CAPSULE STUDY N/A 01/15/2013   NORMAL.   . IR GENERIC  HISTORICAL  03/17/2016   IR RADIOLOGIST EVAL & MGMT 03/17/2016 [facility]-INTERV RAD  . LESION REMOVAL N/A 05/31/2015   Procedure: REMOVAL RIGHT AND LEFT LESIONS OF MANDIBLE;  Surgeon: Diona Browner, DDS;  Location: [facility];  Service: Oral Surgery;  Laterality: N/A;  . MALONEY DILATION  12/29/2010   RMR;  . NM MYOCAR PERF WALL MOTION  2006   "relavtiely normal" persantine, mild anterior thinning (breast attenuation artifact), no region of scar/ischemia  . OVARIAN CYST REMOVAL    . RECTOCELE REPAIR N/A 06/29/2015   Procedure: POSTERIOR REPAIR (RECTOCELE);  Surgeon: Jonnie Kind, MD;  Location: [facility] ORS;  Service: Gynecology;  Laterality: N/A;  . REDUCTION MAMMAPLASTY Bilateral   . SPINE SURGERY  09/29/2010   Dr. Rolena Infante  . surgical excision of 3 tumors from right thigh and right buttock  and left upper thigh  2010  . TOOTH EXTRACTION Bilateral 12/14/2014   Procedure: REMOVAL OF BILATERAL MANDIBULAR EXOSTOSES;  Surgeon: Diona Browner, DDS;  Location: [facility];  Service: Oral Surgery;  Laterality: Bilateral;  . TRANSTHORACIC ECHOCARDIOGRAM  2010   EF 60-65%, mild conc LVH, grade 1 diastolic dysfunction; mildly calcified MV annulus with mildly thickened leaflets, mildly calcified MR annulus   Allergies  Allergen Reactions  . Cephalexin Hives  . Iron Nausea And Vomiting  . Milk-Related Compounds Other (See Comments)    Doesn't agree with stomach.   . Penicillins Hives    Has patient had a PCN reaction causing immediate rash, facial/tongue/throat swelling, SOB or lightheadedness with hypotension: Yes Has patient had a PCN reaction causing severe rash involving mucus membranes or skin necrosis: No Has patient had a PCN reaction that required hospitalization No Has patient had a PCN reaction occurring within the last 10 years: No If all of the above answers are "NO", then may proceed with Cephalosporin use.   [name] Phenazopyridine Hcl Hives         No current facility-administered medications on file prior to  encounter.   Current Outpatient Medications on File Prior to Encounter  Medication Sig Dispense Refill  . ACCU-CHEK AVIVA PLUS test strip CHECK BLOOD SUGAR FOUR TIMES DAILY 400 strip 11  . albuterol (VENTOLIN HFA) 108 (90 Base) MCG/ACT inhaler INHALE 1 TO 2 PUFFS EVERY 6 HOURS AS NEEDED FOR WHEEZING, SHORTNESS OF BREATH 18 g 3  . alendronate (FOSAMAX) 70 MG tablet Take 1 tablet (70 mg total) by mouth every 7 (seven) days. Take with a full glass of water on an empty stomach. 12 tablet 3  . amLODipine (NORVASC) 10 MG tablet TAKE 1 TABLET EVERY DAY 90 tablet 3  . ascorbic acid (VITAMIN C) 500 MG tablet Take 500 mg by mouth daily.    [name]  aspirin EC 81 MG tablet Take 1 tablet (81 mg total) by mouth daily with breakfast. 120 tablet 2  . blood glucose meter kit and supplies Dispense based on patient and insurance preference. Use up to four times daily as directed. (FOR ICD-10 E10.9, E11.9). 1 each 0  . buPROPion (WELLBUTRIN XL) 150 MG 24 hr tablet Take 1 tablet (150 mg total) by mouth every morning. 30 tablet 2  . cetirizine (ZYRTEC) 10 MG tablet Take 1 tablet (10 mg total) by mouth daily. 7 tablet 0  . clobetasol cream (TEMOVATE) 9.37 % Apply 1 application topically 2 (two) times daily.     . Continuous Blood Gluc Sensor (FREESTYLE LIBRE 14 DAY SENSOR) MISC 1 each by Does not apply route every 14 (fourteen) days. Change every 2 weeks 2 each 11  . cromolyn (NASALCROM) 5.2 MG/ACT nasal spray Place 1 spray into both nostrils 4 (four) times daily. 13 mL 1  . diclofenac sodium (VOLTAREN) 1 % GEL APPLY 2 GRAMS  TOPICALLY 4 (FOUR) TIMES DAILY. 200 g 1  . diclofenac Sodium (VOLTAREN) 1 % GEL APPLY 2 GRAMS  TOPICALLY 4 (FOUR) TIMES DAILY. 800 g 0  . dicyclomine (BENTYL) 10 MG capsule Take 1 capsule (10 mg total) by mouth 3 (three) times daily as needed for spasms. 90 capsule 2  . ezetimibe (ZETIA) 10 MG tablet Take 1 tablet (10 mg total) by mouth daily. 90 tablet 1  . hydrOXYzine (ATARAX/VISTARIL) 25 MG tablet  Take 1 tablet (25 mg total) by mouth daily as needed for itching. 15 tablet 0  . insulin aspart (NOVOLOG FLEXPEN) 100 UNIT/ML FlexPen INJECT 10 TO 14 UNITS INTO THE SKIN 3 (THREE) TIMES DAILY WITH MEALS. 45 mL 3  . Insulin Glargine, 2 Unit Dial, (TOUJEO MAX SOLOSTAR) 300 UNIT/ML SOPN Inject 25 Units into the skin at bedtime. 9 pen 3  . lamoTRIgine (LAMICTAL) 100 MG tablet TAKE 1 TABLET TWICE DAILY 180 tablet 2  . Lancets (ACCU-CHEK SOFT TOUCH) lancets Use as instructed to check blood sugar 3 times a day. 100 each 12  . levothyroxine (SYNTHROID) 50 MCG tablet TAKE 1 TABLET DAILY, EXCEPT TAKE 1/2 TABLET ON SUNDAY 85 tablet 1  . linaclotide (LINZESS) 72 MCG capsule Take 1 capsule (72 mcg total) by mouth daily before breakfast. Pharmacy-please d/c linzess 145 mcg dosage 30 capsule 2  . LORazepam (ATIVAN) 0.5 MG tablet Take 1 tablet (0.5 mg total) by mouth 3 (three) times daily. 270 tablet 2  . losartan (COZAAR) 50 MG tablet TAKE 1 TABLET EVERY DAY 90 tablet 1  . lubiprostone (AMITIZA) 24 MCG capsule Take 1 capsule (24 mcg total) by mouth 2 (two) times daily with a meal. 60 capsule 2  . metoprolol tartrate (LOPRESSOR) 50 MG tablet TAKE 1 TABLET TWICE DAILY (NEED MD APPOINTMENT) 180 tablet 3  . montelukast (SINGULAIR) 10 MG tablet TAKE 1 TABLET EVERY DAY 90 tablet 1  . NEOMYCIN-POLYMYXIN-HC, OPHTH, SUSP neomycin 3.5 mg-polymyxin 10,000 unit-hydrocort 10 mg/mL eye drop,susp    . nystatin (MYCOSTATIN/NYSTOP) powder APPLY TO AFFECTED AREA 4 TIMES DAILY. 90 g 1  . Omega-3 Fatty Acids (FISH OIL PO) Take 1 capsule by mouth daily.    . ondansetron (ZOFRAN) 4 MG tablet Take 1 tablet (4 mg total) by mouth every 8 (eight) hours as needed for nausea or vomiting. 20 tablet 0  . oxyCODONE-acetaminophen (PERCOCET) 10-325 MG tablet Take 1 tablet by mouth every 8 (eight) hours as needed.     . pregabalin (LYRICA) 75 MG  capsule Take 1 capsule (75 mg total) by mouth daily.    . RABEprazole (ACIPHEX) 20 MG tablet TAKE 1  TABLET TWICE DAILY 180 tablet 3  . RESTASIS 0.05 % ophthalmic emulsion Place 1 drop into both eyes 2 (two) times daily.     . risperiDONE (RISPERDAL) 0.5 MG tablet Take 1 tablet (0.5 mg total) by mouth at bedtime. 90 tablet 2  . rosuvastatin (CRESTOR) 5 MG tablet TAKE 1 TABLET AT BEDTIME 90 tablet 2  . sertraline (ZOLOFT) 100 MG tablet Take 1 tablet (100 mg total) by mouth daily. 90 tablet 2  . SHINGRIX injection     . STIOLTO RESPIMAT 2.5-2.5 MCG/ACT AERS INHALE 2 PUFFS INTO THE LUNGS DAILY. 12 g 3  . traZODone (DESYREL) 100 MG tablet Take 1 tablet (100 mg total) by mouth at bedtime. 90 tablet 2   Social History   Socioeconomic History  . Marital status: Divorced    Spouse name: Not on file  . Number of children: 1  . Years of education: 3  . Highest education level: High school graduate  Occupational History  . Occupation: Disabled  Tobacco Use  . Smoking status: Current Every Day Smoker    Packs/day: 0.50    Years: 30.00    Pack years: 15.00    Types: Cigarettes  . Smokeless tobacco: Never Used  . Tobacco comment: 5-10 cigs a day  Substance and Sexual Activity  . Alcohol use: No    Alcohol/week: 0.0 standard drinks    Comment:    . Drug use: No  . Sexual activity: Not Currently  Other Topics Concern  . Not on file  Social History Narrative   01/29/18 Lives alone, has 3 aides, Mon- Fri 8 hrs, 2 hrs on Sat-Sun, RN manages her meds   Caffeine use: Drink coffee sometimes    Right handed    Social Determinants of Health   Financial Resource Strain: Low Risk   . Difficulty of Paying Living Expenses: Not very hard  Food Insecurity: No Food Insecurity  . Worried About Charity fundraiser in the Last Year: Never true  . Ran Out of Food in the Last Year: Never true  Transportation Needs: No Transportation Needs  . Lack of Transportation (Medical): No  . Lack of Transportation (Non-Medical): No  Physical Activity: Inactive  . Days of Exercise per Week: 0 days  . Minutes  of Exercise per Session: 0 min  Stress: Stress Concern Present  . Feeling of Stress : To some extent  Social Connections: Somewhat Isolated  . Frequency of Communication with Friends and Family: More than three times a week  . Frequency of Social Gatherings with Friends and Family: More than three times a week  . Attends Religious Services: More than 4 times per year  . Active Member of Clubs or Organizations: No  . Attends Archivist Meetings: Never  . Marital Status: Divorced  Human resources officer Violence: Not At Risk  . Fear of Current or Ex-Partner: No  . Emotionally Abused: No  . Physically Abused: No  . Sexually Abused: No   Family History  Problem Relation Age of Onset  . Heart attack Mother        HTN  . Pneumonia Father   . Kidney failure Father   . Diabetes Father   . Pancreatic cancer Sister   . Diabetes Brother   . Hypertension Brother   . Diabetes Brother   . Cancer Sister  breast   . Hypertension Son   . Sleep apnea Son   . Cancer Sister        pancreatic  . Stroke Maternal Grandmother   . Heart attack Maternal Grandfather   . Alcohol abuse Maternal Uncle   . Colon cancer Neg Hx   . Anesthesia problems Neg Hx   . Hypotension Neg Hx   . Malignant hyperthermia Neg Hx   . Pseudochol deficiency Neg Hx   . Breast cancer Neg Hx     OBJECTIVE:  Vitals:   07/16/19 1621  BP: (!) 195/77  Pulse: 75  Resp: (!) 22  Temp: 98.6 F (37 C)  TempSrc: Tympanic  SpO2: 95%    General appearance: alert; appears uncomfortable HENT: normocephalic; atraumatic; dentition: no teeth present, gum with discoloration, black and white to LT lower gum towards front, TTP, no active erythema, or bleeding Neck: supple without LAD Lungs: normal respirations Skin: warm and dry Psychological: alert and cooperative; depressed mood and affect, tearful throughout examination  ASSESSMENT & PLAN:  1. Pain in gums     Meds ordered this encounter  Medications  .  clindamycin (CLEOCIN) 300 MG capsule    Sig: Take 1 capsule (300 mg total) by mouth every 6 (six) hours for 10 days.    Dispense:  40 capsule    Refill:  0    Order Specific Question:   Supervising Provider    Answer:   Raylene Everts [[id number]]  . chlorhexidine (PERIDEX) 0.12 % solution    Sig: Use as directed 15 mLs in the mouth or throat 2 (two) times daily.    Dispense:  473 mL    Refill:  0    Order Specific Question:   Supervising Provider    Answer:   Raylene Everts [[id number]]  . ketorolac (TORADOL) injection 60 mg    Toradol shot given in office [place] prescribed.  Take as directed and to completion Peridex mouthwash prescribed.  Use as directed Continue with orajel and tylenol as needed Follow up with surgeon next week Return or go to the ED if you have any new or worsening symptoms such as fever, chills, difficulty swallowing, painful swallowing, oral or neck swelling, nausea, vomiting, chest pain, SOB, etc...  Reviewed expectations re: course of current medical issues. Questions answered. Outlined signs and symptoms indicating need for more acute intervention. Patient verbalized understanding. After Visit Summary given.   Lestine Box, PA-C 07/16/19 1646

## 2019-07-16 NOTE — Discharge Instructions (Signed)
Toradol shot given in office Cleocin prescribed.  Take as directed and to completion Peridex mouthwash prescribed.  Use as directed Continue with orajel and tylenol as needed Follow up with surgeon next week Return or go to the ED if you have any new or worsening symptoms such as fever, chills, difficulty swallowing, painful swallowing, oral or neck swelling, nausea, vomiting, chest pain, SOB, etc..[name]

## 2019-07-16 NOTE — Patient Outreach (Signed)
[facility]) Care Management  07/16/2019  [name] Nov 13, 1950 [id number]   Telephone call to patient for disease management follow up. No answer.  HIPAA compliant voice message left.  Plan: RN CM will attempt patient again in the month of July and send letter.  Jone Baseman, RN, MSN [place] Management Care Management Coordinator Direct Line [phone] Cell [phone] Toll Free: [phone]  Fax: [phone]

## 2019-07-18 ENCOUNTER — Other Ambulatory Visit: Payer: Self-pay

## 2019-07-18 DIAGNOSIS — J449 Chronic obstructive pulmonary disease, unspecified: Secondary | ICD-10-CM | POA: Diagnosis not present

## 2019-07-18 DIAGNOSIS — E1142 Type 2 diabetes mellitus with diabetic polyneuropathy: Secondary | ICD-10-CM | POA: Diagnosis not present

## 2019-07-18 DIAGNOSIS — J42 Unspecified chronic bronchitis: Secondary | ICD-10-CM | POA: Diagnosis not present

## 2019-07-18 DIAGNOSIS — E1165 Type 2 diabetes mellitus with hyperglycemia: Secondary | ICD-10-CM | POA: Diagnosis not present

## 2019-07-18 DIAGNOSIS — F17210 Nicotine dependence, cigarettes, uncomplicated: Secondary | ICD-10-CM | POA: Diagnosis not present

## 2019-07-18 DIAGNOSIS — D649 Anemia, unspecified: Secondary | ICD-10-CM | POA: Diagnosis not present

## 2019-07-18 DIAGNOSIS — F250 Schizoaffective disorder, bipolar type: Secondary | ICD-10-CM | POA: Diagnosis not present

## 2019-07-18 DIAGNOSIS — M160 Bilateral primary osteoarthritis of hip: Secondary | ICD-10-CM | POA: Diagnosis not present

## 2019-07-18 DIAGNOSIS — E039 Hypothyroidism, unspecified: Secondary | ICD-10-CM | POA: Diagnosis not present

## 2019-07-18 NOTE — Patient Outreach (Signed)
[facility]) Care Management  [place]  07/18/2019   Renda Pohlman [place] 1950-08-25 [id number]  Subjective: Telephone call to patient for follow up urgent care visit and disease management follow up. Patient reports that she is having some gum pain after gum biopsy by the oral surgeon.  She states she has an infection in her gum and was given antibiotics and mouthwash to use.  She reports she still has some pain but her swelling is better.  She is to see the oral surgeon on Monday for follow up. Patient states she was not sure if it was cancer but her dentist saw something on her gum from xray's.  Discussed signs of worsening infection and importance of medical follow up for infection. She verbalized understanding.  Patient reports her COPD is doing good and voices no other concerns.     Objective:   Encounter Medications:  Outpatient Encounter Medications as of 07/18/2019  Medication Sig  . ACCU-CHEK AVIVA PLUS test strip CHECK BLOOD SUGAR FOUR TIMES DAILY  . albuterol (VENTOLIN HFA) 108 (90 Base) MCG/ACT inhaler INHALE 1 TO 2 PUFFS EVERY 6 HOURS AS NEEDED FOR WHEEZING, SHORTNESS OF BREATH  . alendronate (FOSAMAX) 70 MG tablet Take 1 tablet (70 mg total) by mouth every 7 (seven) days. Take with a full glass of water on an empty stomach.  [name] amLODipine (NORVASC) 10 MG tablet TAKE 1 TABLET EVERY DAY  . ascorbic acid (VITAMIN C) 500 MG tablet Take 500 mg by mouth daily.  [name] aspirin EC 81 MG tablet Take 1 tablet (81 mg total) by mouth daily with breakfast.  . blood glucose meter kit and supplies Dispense based on patient and insurance preference. Use up to four times daily as directed. (FOR ICD-10 E10.9, E11.9).  [name] buPROPion (WELLBUTRIN XL) 150 MG 24 hr tablet Take 1 tablet (150 mg total) by mouth every morning.  . cetirizine (ZYRTEC) 10 MG tablet Take 1 tablet (10 mg total) by mouth daily.  . chlorhexidine (PERIDEX) 0.12 % solution Use as directed 15 mLs in the mouth or throat 2  (two) times daily.  . clindamycin (CLEOCIN) 300 MG capsule Take 1 capsule (300 mg total) by mouth every 6 (six) hours for 10 days.  . clobetasol cream (TEMOVATE) 3.15 % Apply 1 application topically 2 (two) times daily.   . Continuous Blood Gluc Sensor (FREESTYLE LIBRE 14 DAY SENSOR) MISC 1 each by Does not apply route every 14 (fourteen) days. Change every 2 weeks  . cromolyn (NASALCROM) 5.2 MG/ACT nasal spray Place 1 spray into both nostrils 4 (four) times daily.  . diclofenac sodium (VOLTAREN) 1 % GEL APPLY 2 GRAMS  TOPICALLY 4 (FOUR) TIMES DAILY.  [name] diclofenac Sodium (VOLTAREN) 1 % GEL APPLY 2 GRAMS  TOPICALLY 4 (FOUR) TIMES DAILY.  [name] dicyclomine (BENTYL) 10 MG capsule Take 1 capsule (10 mg total) by mouth 3 (three) times daily as needed for spasms.  [name] ezetimibe (ZETIA) 10 MG tablet Take 1 tablet (10 mg total) by mouth daily.  . hydrOXYzine (ATARAX/VISTARIL) 25 MG tablet Take 1 tablet (25 mg total) by mouth daily as needed for itching.  . insulin aspart (NOVOLOG FLEXPEN) 100 UNIT/ML FlexPen INJECT 10 TO 14 UNITS INTO THE SKIN 3 (THREE) TIMES DAILY WITH MEALS.  [name] Insulin Glargine, 2 Unit Dial, (TOUJEO MAX SOLOSTAR) 300 UNIT/ML SOPN Inject 25 Units into the skin at bedtime.  . lamoTRIgine (LAMICTAL) 100 MG tablet TAKE 1 TABLET TWICE DAILY  . Lancets (ACCU-CHEK SOFT  TOUCH) lancets Use as instructed to check blood sugar 3 times a day.  . levothyroxine (SYNTHROID) 50 MCG tablet TAKE 1 TABLET DAILY, EXCEPT TAKE 1/2 TABLET ON SUNDAY  . linaclotide (LINZESS) 72 MCG capsule Take 1 capsule (72 mcg total) by mouth daily before breakfast. Pharmacy-please d/c linzess 145 mcg dosage  . LORazepam (ATIVAN) 0.5 MG tablet Take 1 tablet (0.5 mg total) by mouth 3 (three) times daily.  [name] losartan (COZAAR) 50 MG tablet TAKE 1 TABLET EVERY DAY  . lubiprostone (AMITIZA) 24 MCG capsule Take 1 capsule (24 mcg total) by mouth 2 (two) times daily with a meal.  . metoprolol tartrate (LOPRESSOR) 50 MG tablet TAKE 1 TABLET  TWICE DAILY (NEED MD APPOINTMENT)  . montelukast (SINGULAIR) 10 MG tablet TAKE 1 TABLET EVERY DAY  . NEOMYCIN-POLYMYXIN-HC, OPHTH, SUSP neomycin 3.5 mg-polymyxin 10,000 unit-hydrocort 10 mg/mL eye drop,susp  . nystatin (MYCOSTATIN/NYSTOP) powder APPLY TO AFFECTED AREA 4 TIMES DAILY.  [name] Omega-3 Fatty Acids (FISH OIL PO) Take 1 capsule by mouth daily.  . ondansetron (ZOFRAN) 4 MG tablet Take 1 tablet (4 mg total) by mouth every 8 (eight) hours as needed for nausea or vomiting.  [name] oxyCODONE-acetaminophen (PERCOCET) 10-325 MG tablet Take 1 tablet by mouth every 8 (eight) hours as needed.   . pregabalin (LYRICA) 75 MG capsule Take 1 capsule (75 mg total) by mouth daily.  . RABEprazole (ACIPHEX) 20 MG tablet TAKE 1 TABLET TWICE DAILY  . RESTASIS 0.05 % ophthalmic emulsion Place 1 drop into both eyes 2 (two) times daily.   . risperiDONE (RISPERDAL) 0.5 MG tablet Take 1 tablet (0.5 mg total) by mouth at bedtime.  . rosuvastatin (CRESTOR) 5 MG tablet TAKE 1 TABLET AT BEDTIME  . sertraline (ZOLOFT) 100 MG tablet Take 1 tablet (100 mg total) by mouth daily.  [name] SHINGRIX injection   . STIOLTO RESPIMAT 2.5-2.5 MCG/ACT AERS INHALE 2 PUFFS INTO THE LUNGS DAILY.  . traZODone (DESYREL) 100 MG tablet Take 1 tablet (100 mg total) by mouth at bedtime.   No facility-administered encounter medications on file as of 07/18/2019.    Functional Status:  In your present state of health, do you have any difficulty performing the following activities: 07/07/2019 05/21/2019  Hearing? N Y  Comment - Hard of hearing.  Vision? N N  Difficulty concentrating or making decisions? Y N  Walking or climbing stairs? Y Y  Comment - Unsteady gait, impaired mobility, weakness.  Dressing or bathing? Y Y  Comment - Unsteady gait, impaired mobility, weakness.  Doing errands, shopping? Y Y  Comment - Unsteady gait, impaired mobility, weakness.  Preparing Food and eating ? N Y  Comment - Unsteady gait, impaired mobility, weakness.   Using the Toilet? N Y  Comment - Unsteady gait, impaired mobility, weakness.  In the past six months, have you accidently leaked urine? N N  Do you have problems with loss of bowel control? N N  Managing your Medications? Y N  Managing your Finances? Y N  Housekeeping or managing your Housekeeping? Y Y  Comment - Patient has an in-home caregiver.  Some recent data might be hidden    Fall/Depression Screening: Fall Risk  07/07/2019 05/21/2019 04/21/2019  Falls in the past year? 1 0 1  Comment - - -  Number falls in past yr: 1 0 1  Comment - - -  Injury with Fall? 1 0 1  Comment - - -  Risk Factor Category  - - -  Risk for fall  due to : - History of fall(s);Impaired balance/gait;Impaired mobility -  Follow up - Education provided;Falls prevention discussed -   PHQ 2/9 Scores 07/07/2019 05/21/2019 05/13/2019 04/21/2019 01/15/2019 12/24/2018 12/13/2018  PHQ - 2 Score _0 0  PHQ- 9 Score _1 -  Some encounter information is confidential and restricted. Go to Review Flowsheets activity to see all data.    Assessment: Patient currently being treated for mouth infection.  Patient to see oral surgeon next week.    Plan:  [facility] CM Care Plan Problem One     Most Recent Value  Care Plan Problem One  Knowledge deficit related to recent hospitalization  Role Documenting the Problem One  Care Management Telephonic [place] for Problem One  Active  [facility] Long Term Goal   Patient will not have any exacerbations of COPD within 90 days.    [facility] Long Term Goal Start Date  07/18/19  Interventions for Problem One Long Term Goal  Patient in green zone.  Discussed with patient signs of worsening COPD and when to notify physician.      RN CM will contact patient again in the month of May and patient agreeable.    Jone Baseman, RN, MSN [place] Management Care Management Coordinator Direct Line [phone] Cell [phone] Toll Free: [phone]  Fax:  [phone]

## 2019-07-21 DIAGNOSIS — E039 Hypothyroidism, unspecified: Secondary | ICD-10-CM | POA: Diagnosis not present

## 2019-07-21 DIAGNOSIS — D649 Anemia, unspecified: Secondary | ICD-10-CM | POA: Diagnosis not present

## 2019-07-21 DIAGNOSIS — E1165 Type 2 diabetes mellitus with hyperglycemia: Secondary | ICD-10-CM | POA: Diagnosis not present

## 2019-07-21 DIAGNOSIS — J449 Chronic obstructive pulmonary disease, unspecified: Secondary | ICD-10-CM | POA: Diagnosis not present

## 2019-07-21 DIAGNOSIS — F17210 Nicotine dependence, cigarettes, uncomplicated: Secondary | ICD-10-CM | POA: Diagnosis not present

## 2019-07-21 DIAGNOSIS — E1142 Type 2 diabetes mellitus with diabetic polyneuropathy: Secondary | ICD-10-CM | POA: Diagnosis not present

## 2019-07-21 DIAGNOSIS — M160 Bilateral primary osteoarthritis of hip: Secondary | ICD-10-CM | POA: Diagnosis not present

## 2019-07-21 DIAGNOSIS — J42 Unspecified chronic bronchitis: Secondary | ICD-10-CM | POA: Diagnosis not present

## 2019-07-21 DIAGNOSIS — F250 Schizoaffective disorder, bipolar type: Secondary | ICD-10-CM | POA: Diagnosis not present

## 2019-07-22 ENCOUNTER — Ambulatory Visit (INDEPENDENT_AMBULATORY_CARE_PROVIDER_SITE_OTHER): Payer: Medicare HMO | Admitting: Podiatry

## 2019-07-22 ENCOUNTER — Encounter: Payer: Self-pay | Admitting: Podiatry

## 2019-07-22 ENCOUNTER — Other Ambulatory Visit: Payer: Self-pay

## 2019-07-22 VITALS — Temp 97.6°F

## 2019-07-22 DIAGNOSIS — E1142 Type 2 diabetes mellitus with diabetic polyneuropathy: Secondary | ICD-10-CM | POA: Diagnosis not present

## 2019-07-22 DIAGNOSIS — M79676 Pain in unspecified toe(s): Secondary | ICD-10-CM

## 2019-07-22 DIAGNOSIS — B351 Tinea unguium: Secondary | ICD-10-CM

## 2019-07-22 DIAGNOSIS — Q828 Other specified congenital malformations of skin: Secondary | ICD-10-CM

## 2019-07-22 NOTE — Patient Instructions (Signed)
Diabetes Mellitus and Foot Care Foot care is an important part of your health, especially when you have diabetes. Diabetes may cause you to have problems because of poor blood flow (circulation) to your feet and legs, which can cause your skin to:  Become thinner and drier.  Break more easily.  Heal more slowly.  Peel and crack. You may also have nerve damage (neuropathy) in your legs and feet, causing decreased feeling in them. This means that you may not notice minor injuries to your feet that could lead to more serious problems. Noticing and addressing any potential problems early is the best way to prevent future foot problems. How to care for your feet Foot hygiene  Wash your feet daily with warm water and mild soap. Do not use hot water. Then, pat your feet and the areas between your toes until they are completely dry. Do not soak your feet as this can dry your skin.  Trim your toenails straight across. Do not dig under them or around the cuticle. File the edges of your nails with an emery board or nail file.  Apply a moisturizing lotion or petroleum jelly to the skin on your feet and to dry, brittle toenails. Use lotion that does not contain alcohol and is unscented. Do not apply lotion between your toes. Shoes and socks  Wear clean socks or stockings every day. Make sure they are not too tight. Do not wear knee-high stockings since they may decrease blood flow to your legs.  Wear shoes that fit properly and have enough cushioning. Always look in your shoes before you put them on to be sure there are no objects inside.  To break in new shoes, wear them for just a few hours a day. This prevents injuries on your feet. Wounds, scrapes, corns, and calluses  Check your feet daily for blisters, cuts, bruises, sores, and redness. If you cannot see the bottom of your feet, use a mirror or ask someone for help.  Do not cut corns or calluses or try to remove them with medicine.  If you  find a minor scrape, cut, or break in the skin on your feet, keep it and the skin around it clean and dry. You may clean these areas with mild soap and water. Do not clean the area with peroxide, alcohol, or iodine.  If you have a wound, scrape, corn, or callus on your foot, look at it several times a day to make sure it is healing and not infected. Check for: ? Redness, swelling, or pain. ? Fluid or blood. ? Warmth. ? Pus or a bad smell. General instructions  Do not cross your legs. This may decrease blood flow to your feet.  Do not use heating pads or hot water bottles on your feet. They may burn your skin. If you have lost feeling in your feet or legs, you may not know this is happening until it is too late.  Protect your feet from hot and cold by wearing shoes, such as at the beach or on hot pavement.  Schedule a complete foot exam at least once a year (annually) or more often if you have foot problems. If you have foot problems, report any cuts, sores, or bruises to your health care provider immediately. Contact a health care provider if:  You have a medical condition that increases your risk of infection and you have any cuts, sores, or bruises on your feet.  You have an injury that is not   healing.  You have redness on your legs or feet.  You feel burning or tingling in your legs or feet.  You have pain or cramps in your legs and feet.  Your legs or feet are numb.  Your feet always feel cold.  You have pain around a toenail. Get help right away if:  You have a wound, scrape, corn, or callus on your foot and: ? You have pain, swelling, or redness that gets worse. ? You have fluid or blood coming from the wound, scrape, corn, or callus. ? Your wound, scrape, corn, or callus feels warm to the touch. ? You have pus or a bad smell coming from the wound, scrape, corn, or callus. ? You have a fever. ? You have a red line going up your leg. Summary  Check your feet every day  for cuts, sores, red spots, swelling, and blisters.  Moisturize feet and legs daily.  Wear shoes that fit properly and have enough cushioning.  If you have foot problems, report any cuts, sores, or bruises to your health care provider immediately.  Schedule a complete foot exam at least once a year (annually) or more often if you have foot problems. This information is not intended to replace advice given to you by your health care provider. Make sure you discuss any questions you have with your health care provider. Document Revised: 11/20/2018 Document Reviewed: 03/31/2016 [employer] Patient Education  2020 [employer]  Corns and Calluses Corns are small areas of thickened skin that occur on the top, sides, or tip of a toe. They contain a cone-shaped core with a point that can press on a nerve below. This causes pain.  Calluses are areas of thickened skin that can occur anywhere on the body, including the hands, fingers, palms, soles of the feet, and heels. Calluses are usually larger than corns. What are the causes? Corns and calluses are caused by rubbing (friction) or pressure, such as from shoes that are too tight or do not fit properly. What increases the risk? Corns are more likely to develop in people who have misshapen toes (toe deformities), such as hammer toes. Calluses can occur with friction to any area of the skin. They are more likely to develop in people who:  Work with their hands.  Wear shoes that fit poorly, are too tight, or are high-heeled.  Have toe deformities. What are the signs or symptoms? Symptoms of a corn or callus include:  A hard growth on the skin.  Pain or tenderness under the skin.  Redness and swelling.  Increased discomfort while wearing tight-fitting shoes, if your feet are affected. If a corn or callus becomes infected, symptoms may include:  Redness and swelling that gets worse.  Pain.  Fluid, blood, or pus draining from the corn or  callus. How is this diagnosed? Corns and calluses may be diagnosed based on your symptoms, your medical history, and a physical exam. How is this treated? Treatment for corns and calluses may include:  Removing the cause of the friction or pressure. This may involve: ? Changing your shoes. ? Wearing shoe inserts (orthotics) or other protective layers in your shoes, such as a corn pad. ? Wearing gloves.  Applying medicine to the skin (topical medicine) to help soften skin in the hardened, thickened areas.  Removing layers of dead skin with a file to reduce the size of the corn or callus.  Removing the corn or callus with a scalpel or laser.  Taking   antibiotic medicines, if your corn or callus is infected.  Having surgery, if a toe deformity is the cause. Follow these instructions at home:   Take over-the-counter and prescription medicines only as told by your health care provider.  If you were prescribed an antibiotic, take it as told by your health care provider. Do not stop taking it even if your condition starts to improve.  Wear shoes that fit well. Avoid wearing high-heeled shoes and shoes that are too tight or too loose.  Wear any padding, protective layers, gloves, or orthotics as told by your health care provider.  Soak your hands or feet and then use a file or pumice stone to soften your corn or callus. Do this as told by your health care provider.  Check your corn or callus every day for symptoms of infection. Contact a health care provider if you:  Notice that your symptoms do not improve with treatment.  Have redness or swelling that gets worse.  Notice that your corn or callus becomes painful.  Have fluid, blood, or pus coming from your corn or callus.  Have new symptoms. Summary  Corns are small areas of thickened skin that occur on the top, sides, or tip of a toe.  Calluses are areas of thickened skin that can occur anywhere on the body, including the  hands, fingers, palms, and soles of the feet. Calluses are usually larger than corns.  Corns and calluses are caused by rubbing (friction) or pressure, such as from shoes that are too tight or do not fit properly.  Treatment may include wearing any padding, protective layers, gloves, or orthotics as told by your health care provider. This information is not intended to replace advice given to you by your health care provider. Make sure you discuss any questions you have with your health care provider. Document Revised: 06/19/2018 Document Reviewed: 01/10/2017 [employer] Patient Education  2020 [employer]  Peripheral Neuropathy Peripheral neuropathy is a type of nerve damage. It affects nerves that carry signals between the spinal cord and the arms, legs, and the rest of the body (peripheral nerves). It does not affect nerves in the spinal cord or brain. In peripheral neuropathy, one nerve or a group of nerves may be damaged. Peripheral neuropathy is a broad category that includes many specific nerve disorders, like diabetic neuropathy, hereditary neuropathy, and carpal tunnel syndrome. What are the causes? This condition may be caused by:  Diabetes. This is the most common cause of peripheral neuropathy.  Nerve injury.  Pressure or stress on a nerve that lasts a long time.  Lack (deficiency) of B vitamins. This can result from alcoholism, poor diet, or a restricted diet.  Infections.  Autoimmune diseases, such as rheumatoid arthritis and systemic lupus erythematosus.  Nerve diseases that are passed from parent to child (inherited).  Some medicines, such as cancer medicines (chemotherapy).  Poisonous (toxic) substances, such as lead and mercury.  Too little blood flowing to the legs.  Kidney disease.  Thyroid disease. In some cases, the cause of this condition is not known. What are the signs or symptoms? Symptoms of this condition depend on which of your nerves is damaged.  Common symptoms include:  Loss of feeling (numbness) in the feet, hands, or both.  Tingling in the feet, hands, or both.  Burning pain.  Very sensitive skin.  Weakness.  Not being able to move a part of the body (paralysis).  Muscle twitching.  Clumsiness or poor coordination.  Loss of balance.    Not being able to control your bladder.  Feeling dizzy.  Sexual problems. How is this diagnosed? Diagnosing and finding the cause of peripheral neuropathy can be difficult. Your health care provider will take your medical history and do a physical exam. A neurological exam will also be done. This involves checking things that are affected by your brain, spinal cord, and nerves (nervous system). For example, your health care provider will check your reflexes, how you move, and what you can feel. You may have other tests, such as:  Blood tests.  Electromyogram (EMG) and nerve conduction tests. These tests check nerve function and how well the nerves are controlling the muscles.  Imaging tests, such as CT scans or MRI to rule out other causes of your symptoms.  Removing a small piece of nerve to be examined in a lab (nerve biopsy). This is rare.  Removing and examining a small amount of the fluid that surrounds the brain and spinal cord (lumbar puncture). This is rare. How is this treated? Treatment for this condition may involve:  Treating the underlying cause of the neuropathy, such as diabetes, kidney disease, or vitamin deficiencies.  Stopping medicines that can cause neuropathy, such as chemotherapy.  Medicine to relieve pain. Medicines may include: ? Prescription or over-the-counter pain medicine. ? Antiseizure medicine. ? Antidepressants. ? Pain-relieving patches that are applied to painful areas of skin.  Surgery to relieve pressure on a nerve or to destroy a nerve that is causing pain.  Physical therapy to help improve movement and balance.  Devices to help you  move around (assistive devices). Follow these instructions at home: Medicines  Take over-the-counter and prescription medicines only as told by your health care provider. Do not take any other medicines without first asking your health care provider.  Do not drive or use heavy machinery while taking prescription pain medicine. Lifestyle   Do not use any products that contain nicotine or tobacco, such as cigarettes and e-cigarettes. Smoking keeps blood from reaching damaged nerves. If you need help quitting, ask your health care provider.  Avoid or limit alcohol. Too much alcohol can cause a vitamin B deficiency, and vitamin B is needed for healthy nerves.  Eat a healthy diet. This includes: ? Eating foods that are high in fiber, such as fresh fruits and vegetables, whole grains, and beans. ? Limiting foods that are high in fat and processed sugars, such as fried or sweet foods. General instructions   If you have diabetes, work closely with your health care provider to keep your blood sugar under control.  If you have numbness in your feet: ? Check every day for signs of injury or infection. Watch for redness, warmth, and swelling. ? Wear padded socks and comfortable shoes. These help protect your feet.  Develop a good support system. Living with peripheral neuropathy can be stressful. Consider talking with a mental health specialist or joining a support group.  Use assistive devices and attend physical therapy as told by your health care provider. This may include using a walker or a cane.  Keep all follow-up visits as told by your health care provider. This is important. Contact a health care provider if:  You have new signs or symptoms of peripheral neuropathy.  You are struggling emotionally from dealing with peripheral neuropathy.  Your pain is not well-controlled. Get help right away if:  You have an injury or infection that is not healing normally.  You develop new  weakness in an arm or leg.    You fall frequently. Summary  Peripheral neuropathy is when the nerves in the arms, or legs are damaged, resulting in numbness, weakness, or pain.  There are many causes of peripheral neuropathy, including diabetes, pinched nerves, vitamin deficiencies, autoimmune disease, and hereditary conditions.  Diagnosing and finding the cause of peripheral neuropathy can be difficult. Your health care provider will take your medical history, do a physical exam, and do tests, including blood tests and nerve function tests.  Treatment involves treating the underlying cause of the neuropathy and taking medicines to help control pain. Physical therapy and assistive devices may also help. This information is not intended to replace advice given to you by your health care provider. Make sure you discuss any questions you have with your health care provider. Document Revised: 02/09/2017 Document Reviewed: 05/08/2016 [employer] Patient Education  2020 [employer]  

## 2019-07-23 ENCOUNTER — Telehealth (INDEPENDENT_AMBULATORY_CARE_PROVIDER_SITE_OTHER): Payer: Medicare HMO | Admitting: Psychiatry

## 2019-07-23 ENCOUNTER — Encounter (HOSPITAL_COMMUNITY): Payer: Self-pay | Admitting: Psychiatry

## 2019-07-23 DIAGNOSIS — F331 Major depressive disorder, recurrent, moderate: Secondary | ICD-10-CM

## 2019-07-23 MED ORDER — RISPERIDONE 0.5 MG PO TABS: 0.5000 mg | ORAL_TABLET | Freq: Every day | ORAL | 2 refills | Status: DC

## 2019-07-23 MED ORDER — TRAZODONE HCL 100 MG PO TABS: 100.0000 mg | ORAL_TABLET | Freq: Every day | ORAL | 2 refills | Status: DC

## 2019-07-23 MED ORDER — BUPROPION HCL ER (XL) 150 MG PO TB24: 150.0000 mg | ORAL_TABLET | ORAL | 2 refills | Status: DC

## 2019-07-23 MED ORDER — LORAZEPAM 0.5 MG PO TABS: 0.5000 mg | ORAL_TABLET | Freq: Three times a day (TID) | ORAL | 2 refills | Status: DC

## 2019-07-23 MED ORDER — SERTRALINE HCL 100 MG PO TABS: 100.0000 mg | ORAL_TABLET | Freq: Every day | ORAL | 2 refills | Status: DC

## 2019-07-23 MED ORDER — LAMOTRIGINE 100 MG PO TABS: 100.0000 mg | ORAL_TABLET | Freq: Two times a day (BID) | ORAL | 2 refills | Status: DC

## 2019-07-23 NOTE — Progress Notes (Signed)
Virtual Visit via Telephone Note  I connected with [name] on 07/23/19 at  1:40 PM EDT by telephone and verified that I am speaking with the correct person using two identifiers.   I discussed the limitations, risks, security and privacy concerns of performing an evaluation and management service by telephone and the availability of in person appointments. I also discussed with the patient that there may be a patient responsible charge related to this service. The patient expressed understanding and agreed to proceed.    I discussed the assessment and treatment plan with the patient. The patient was provided an opportunity to ask questions and all were answered. The patient agreed with the plan and demonstrated an understanding of the instructions.   The patient was advised to call back or seek an in-person evaluation if the symptoms worsen or if the condition fails to improve as anticipated.  I provided 15 minutes of non-face-to-face time during this encounter.   Levonne Spiller, MD   MD/PA/NP OP Progress Note  07/23/2019 2:05 PM [name]  MRN:  [id number]  Chief Complaint:  Chief Complaint    Depression; Anxiety     HPI: This patient is a 69 year old divorced black female who lives alone in [place].  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  The patient returns for follow-up after 2 months for treatment of depression and anxiety.  She states that she is doing okay but still having a lot of pain issues in her back neck shoulder and now in her mouth from her recent biopsy.  Apparently she had a gum infection and is on antibiotics.  She states that she wanted to have shoulder surgery but cannot do it right now and part of this is because she is still smoking.  She asked that she can get on Zyban but I recently put her on Wellbutrin which I explained is the same drug.  She states that she is going to try to cut down.  She does seem to be in a  better mood since we added the Wellbutrin and she was talkative and more upbeat today.  She denies severe anxiety or thoughts of self-harm or suicide.  She seems to be sleeping fairly well with the medication. Visit Diagnosis:    ICD-10-CM   1. Major depressive disorder, recurrent episode, moderate (HCC)  F33.1     Past Psychiatric History: Numerous hospitalizations for depression years ago  Past Medical History:  Past Medical History:  Diagnosis Date  . Allergy   . Anemia   . Anxiety    takes Ativan daily  . Arthritis   . Assistance needed for mobility   . Bipolar disorder ([place])    takes Risperdal nightly  . Blood transfusion   . Cancer ([place])    In her gum  . Carpal tunnel syndrome of right wrist 05/23/2011  . Cervical disc disorder with radiculopathy of cervical region 10/31/2012  . Chronic back pain   . Chronic idiopathic constipation   . Chronic neck and back pain   . Colon polyps   . COPD (chronic obstructive pulmonary disease) with chronic bronchitis ([place]) 09/16/2013   Office Spirometry 10/30/2013-submaximal effort based on appearance of loop and curve. Numbers would fit with severe restriction but her physiologic capability may be better than this. FVC 0.91/44%, and 10.74/45%, FEV1/FVC 0.81, FEF 25-75% 1.43/69%    . Diabetes mellitus    Type II  . Diverticulosis  TCS 9/08 by Dr. Delfin Edis for diarrhea . Bx for micro scopic colitis negative.   . Fibromyalgia   . Frequent falls   . GERD (gastroesophageal reflux disease)    takes Aciphex daily  . Glaucoma    eye drops daily  . Gum symptoms    infection on antibiotic  . Hemiplegia affecting non-dominant side, post-stroke 08/02/2011  . Hiatal hernia   . Hyperlipidemia    takes Crestor daily  . Hypertension    takes Amlodipine,Metoprolol,and Clonidine daily  . Hypothyroidism    takes Synthroid daily  . IBS (irritable bowel syndrome)   . Insomnia    takes Trazodone nightly  . Major depression, recurrent ([place])     takes Zoloft daily  . 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 Metabolic encephalopathy 09/11/7791  . Migraines    chronic headaches  . Mononeuritis lower limb   . Narcolepsy   . Osteoporosis   . Pancreatitis 2006   due to Depakote with normal EUS   . Schatzki's ring    non critical / EGD with ED 8/2011with RMR  . Seizures ([place])    takes Lamictal daily.Last seizure 3 yrs ago  . Sleep apnea    on CPAP  . Small bowel obstruction ([place])   . Stroke [facility])    left sided weakness, speech changes  . Tubular adenoma of colon     Past Surgical History:  Procedure Laterality Date  . ABDOMINAL HYSTERECTOMY  1978  . BACK SURGERY  July 2012  . BACTERIAL OVERGROWTH TEST N/A 05/05/2013   Procedure: BACTERIAL OVERGROWTH TEST;  Surgeon: Daneil Dolin, MD;  [facility];  Service: Endoscopy;  Laterality: N/A;  7:30  . BIOPSY THYROID  2009  . BRAIN SURGERY  11/2011   resection of meningioma  . BREAST REDUCTION SURGERY  1994  . CARDIAC CATHETERIZATION  05/10/2005   normal coronaries, normal LV systolic function and EF (Dr. Jackie Plum)  . CARPAL TUNNEL RELEASE Left 07/22/04   Dr. Aline Brochure  . CATARACT EXTRACTION Bilateral   . CHOLECYSTECTOMY  1984  . COLONOSCOPY N/A 09/25/2012   [id number] diverticulosis.  colonic polyp-removed : tubular adenoma  . CRANIOTOMY  11/23/2011   Procedure: CRANIOTOMY TUMOR EXCISION;  Surgeon: Hosie Spangle, MD;  Location: [place] NEURO ORS;  Service: Neurosurgery;  Laterality: N/A;  Craniotomy for tumor resection  . ESOPHAGOGASTRODUODENOSCOPY  12/29/2010   [place]k-Retained food in the esophagus and stomach, small hiatal hernia, status post Maloney dilation of the esophagus  . ESOPHAGOGASTRODUODENOSCOPY N/A 09/25/2012   QTM:[id number] atonic baggy esophagus status post Maloney dilation 8 F. Hiatal hernia  . GIVENS CAPSULE STUDY N/A 01/15/2013   NORMAL.   . IR GENERIC HISTORICAL  03/17/2016   IR RADIOLOGIST EVAL & MGMT 03/17/2016 [facility]-INTERV RAD  . LESION REMOVAL N/A 05/31/2015    Procedure: REMOVAL RIGHT AND LEFT LESIONS OF MANDIBLE;  Surgeon: Diona Browner, DDS;  Location: [facility];  Service: Oral Surgery;  Laterality: N/A;  . MALONEY DILATION  12/29/2010   RMR;  . NM MYOCAR PERF WALL MOTION  2006   "relavtiely normal" persantine, mild anterior thinning (breast attenuation artifact), no region of scar/ischemia  . OVARIAN CYST REMOVAL    . RECTOCELE REPAIR N/A 06/29/2015   Procedure: POSTERIOR REPAIR (RECTOCELE);  Surgeon: Jonnie Kind, MD;  Location: [facility];  Service: Gynecology;  Laterality: N/A;  . REDUCTION MAMMAPLASTY Bilateral   . SPINE SURGERY  09/29/2010   Dr. Rolena Infante  . surgical excision of 3 tumors from right  thigh and right buttock  and left upper thigh  2010  . TOOTH EXTRACTION Bilateral 12/14/2014   Procedure: REMOVAL OF BILATERAL MANDIBULAR EXOSTOSES;  Surgeon: Diona Browner, DDS;  Location: [facility];  Service: Oral Surgery;  Laterality: Bilateral;  . TRANSTHORACIC ECHOCARDIOGRAM  2010   EF 60-65%, mild conc LVH, grade 1 diastolic dysfunction; mildly calcified MV annulus with mildly thickened leaflets, mildly calcified MR annulus    Family Psychiatric History: see below  Family History:  Family History  Problem Relation Age of Onset  . Heart attack Mother        HTN  . Pneumonia Father   . Kidney failure Father   . Diabetes Father   . Pancreatic cancer Sister   . Diabetes Brother   . Hypertension Brother   . Diabetes Brother   . Cancer Sister        breast   . Hypertension Son   . Sleep apnea Son   . Cancer Sister        pancreatic  . Stroke Maternal Grandmother   . Heart attack Maternal Grandfather   . Alcohol abuse Maternal Uncle   . Colon cancer Neg Hx   . Anesthesia problems Neg Hx   . Hypotension Neg Hx   . Malignant hyperthermia Neg Hx   . Pseudochol deficiency Neg Hx   . Breast cancer Neg Hx     Social History:  Social History   Socioeconomic History  . Marital status: Divorced    Spouse name: Not on file  . Number of  children: 1  . Years of education: 40  . Highest education level: High school graduate  Occupational History  . Occupation: Disabled  Tobacco Use  . Smoking status: Current Every Day Smoker    Packs/day: 0.50    Years: 30.00    Pack years: 15.00    Types: Cigarettes  . Smokeless tobacco: Never Used  . Tobacco comment: 5-10 cigs a day  Substance and Sexual Activity  . Alcohol use: No    Alcohol/week: 0.0 standard drinks    Comment:    . Drug use: No  . Sexual activity: Not Currently  Other Topics Concern  . Not on file  Social History Narrative   01/29/18 Lives alone, has 3 aides, Mon- Fri 8 hrs, 2 hrs on Sat-Sun, RN manages her meds   Caffeine use: Drink coffee sometimes    Right handed    Social Determinants of Health   Financial Resource Strain: Low Risk   . Difficulty of Paying Living Expenses: Not very hard  Food Insecurity: No Food Insecurity  . Worried About Charity fundraiser in the Last Year: Never true  . Ran Out of Food in the Last Year: Never true  Transportation Needs: No Transportation Needs  . Lack of Transportation (Medical): No  . Lack of Transportation (Non-Medical): No  Physical Activity: Inactive  . Days of Exercise per Week: 0 days  . Minutes of Exercise per Session: 0 min  Stress: Stress Concern Present  . Feeling of Stress : To some extent  Social Connections: Somewhat Isolated  . Frequency of Communication with Friends and Family: More than three times a week  . Frequency of Social Gatherings with Friends and Family: More than three times a week  . Attends Religious Services: More than 4 times per year  . Active Member of Clubs or Organizations: No  . Attends Archivist Meetings: Never  . Marital Status: Divorced  Allergies:  Allergies  Allergen Reactions  . Cephalexin Hives  . Iron Nausea And Vomiting  . Milk-Related Compounds Other (See Comments)    Doesn't agree with stomach.   . Penicillins Hives    Has patient had a  PCN reaction causing immediate rash, facial/tongue/throat swelling, SOB or lightheadedness with hypotension: Yes Has patient had a PCN reaction causing severe rash involving mucus membranes or skin necrosis: No Has patient had a PCN reaction that required hospitalization No Has patient had a PCN reaction occurring within the last 10 years: No If all of the above answers are "NO", then may proceed with Cephalosporin use.   [name] Phenazopyridine Hcl Hives          Metabolic Disorder Labs: Lab Results  Component Value Date   HGBA1C 6.4 (A) 06/24/2019   MPG 108 09/30/2018   MPG 134 10/15/2016   No results found for: PROLACTIN Lab Results  Component Value Date   CHOL 187 02/12/2019   TRIG 202 (H) 02/12/2019   HDL 46 02/12/2019   CHOLHDL 3.5 09/30/2018   VLDL 26 07/19/2015   LDLCALC 106 (H) 02/12/2019   LDLCALC 79 09/30/2018   Lab Results  Component Value Date   TSH 0.94 02/17/2019   TSH 0.82 09/30/2018    Therapeutic Level Labs: Lab Results  Component Value Date   LITHIUM <0.06 (L) 10/15/2016   Lab Results  Component Value Date   VALPROATE <10.0 (L) 08/23/2007   No components found for:  CBMZ  Current Medications: Current Outpatient Medications  Medication Sig Dispense Refill  . ACCU-CHEK AVIVA PLUS test strip CHECK BLOOD SUGAR FOUR TIMES DAILY 400 strip 11  . albuterol (VENTOLIN HFA) 108 (90 Base) MCG/ACT inhaler INHALE 1 TO 2 PUFFS EVERY 6 HOURS AS NEEDED FOR WHEEZING, SHORTNESS OF BREATH 18 g 3  . alendronate (FOSAMAX) 70 MG tablet Take 1 tablet (70 mg total) by mouth every 7 (seven) days. Take with a full glass of water on an empty stomach. 12 tablet 3  . amLODipine (NORVASC) 10 MG tablet TAKE 1 TABLET EVERY DAY 90 tablet 3  . ascorbic acid (VITAMIN C) 500 MG tablet Take 500 mg by mouth daily.    [name] aspirin EC 81 MG tablet Take 1 tablet (81 mg total) by mouth daily with breakfast. 120 tablet 2  . blood glucose meter kit and supplies Dispense based on patient and  insurance preference. Use up to four times daily as directed. (FOR ICD-10 E10.9, E11.9). 1 each 0  . buPROPion (WELLBUTRIN XL) 150 MG 24 hr tablet Take 1 tablet (150 mg total) by mouth every morning. 90 tablet 2  . cetirizine (ZYRTEC) 10 MG tablet Take 1 tablet (10 mg total) by mouth daily. 7 tablet 0  . chlorhexidine (PERIDEX) 0.12 % solution Use as directed 15 mLs in the mouth or throat 2 (two) times daily. 473 mL 0  . clindamycin (CLEOCIN) 300 MG capsule Take 1 capsule (300 mg total) by mouth every 6 (six) hours for 10 days. 40 capsule 0  . clobetasol cream (TEMOVATE) 0.86 % Apply 1 application topically 2 (two) times daily.     . Continuous Blood Gluc Sensor (FREESTYLE LIBRE 14 DAY SENSOR) MISC 1 each by Does not apply route every 14 (fourteen) days. Change every 2 weeks 2 each 11  . cromolyn (NASALCROM) 5.2 MG/ACT nasal spray Place 1 spray into both nostrils 4 (four) times daily. 13 mL 1  . diclofenac sodium (VOLTAREN) 1 % GEL APPLY 2 GRAMS  TOPICALLY 4 (FOUR) TIMES DAILY. 200 g 1  . diclofenac Sodium (VOLTAREN) 1 % GEL APPLY 2 GRAMS  TOPICALLY 4 (FOUR) TIMES DAILY. 800 g 0  . dicyclomine (BENTYL) 10 MG capsule Take 1 capsule (10 mg total) by mouth 3 (three) times daily as needed for spasms. 90 capsule 2  . ezetimibe (ZETIA) 10 MG tablet Take 1 tablet (10 mg total) by mouth daily. 90 tablet 1  . hydroxychloroquine (PLAQUENIL) 200 MG tablet Take 200 mg by mouth 2 (two) times daily.    . hydrOXYzine (ATARAX/VISTARIL) 25 MG tablet Take 1 tablet (25 mg total) by mouth daily as needed for itching. 15 tablet 0  . insulin aspart (NOVOLOG FLEXPEN) 100 UNIT/ML FlexPen INJECT 10 TO 14 UNITS INTO THE SKIN 3 (THREE) TIMES DAILY WITH MEALS. 45 mL 3  . Insulin Glargine, 2 Unit Dial, (TOUJEO MAX SOLOSTAR) 300 UNIT/ML SOPN Inject 25 Units into the skin at bedtime. 9 pen 3  . lamoTRIgine (LAMICTAL) 100 MG tablet Take 1 tablet (100 mg total) by mouth 2 (two) times daily. 180 tablet 2  . Lancets (ACCU-CHEK SOFT  TOUCH) lancets Use as instructed to check blood sugar 3 times a day. 100 each 12  . levothyroxine (SYNTHROID) 50 MCG tablet TAKE 1 TABLET DAILY, EXCEPT TAKE 1/2 TABLET ON SUNDAY 85 tablet 1  . linaclotide (LINZESS) 72 MCG capsule Take 1 capsule (72 mcg total) by mouth daily before breakfast. Pharmacy-please d/c linzess 145 mcg dosage 30 capsule 2  . LORazepam (ATIVAN) 0.5 MG tablet Take 1 tablet (0.5 mg total) by mouth 3 (three) times daily. 270 tablet 2  . losartan (COZAAR) 50 MG tablet TAKE 1 TABLET EVERY DAY 90 tablet 1  . lubiprostone (AMITIZA) 24 MCG capsule Take 1 capsule (24 mcg total) by mouth 2 (two) times daily with a meal. 60 capsule 2  . metaxalone (SKELAXIN) 800 MG tablet Take 800 mg by mouth 3 (three) times daily.    . metoprolol tartrate (LOPRESSOR) 50 MG tablet TAKE 1 TABLET TWICE DAILY (NEED MD APPOINTMENT) 180 tablet 3  . montelukast (SINGULAIR) 10 MG tablet TAKE 1 TABLET EVERY DAY 90 tablet 1  . NEOMYCIN-POLYMYXIN-HC, OPHTH, SUSP neomycin 3.5 mg-polymyxin 10,000 unit-hydrocort 10 mg/mL eye drop,susp    . nystatin (MYCOSTATIN/NYSTOP) powder APPLY TO AFFECTED AREA 4 TIMES DAILY. 90 g 1  . Omega-3 Fatty Acids (FISH OIL PO) Take 1 capsule by mouth daily.    . ondansetron (ZOFRAN) 4 MG tablet Take 1 tablet (4 mg total) by mouth every 8 (eight) hours as needed for nausea or vomiting. 20 tablet 0  . oxyCODONE-acetaminophen (PERCOCET) 10-325 MG tablet Take 1 tablet by mouth every 8 (eight) hours as needed.     . pregabalin (LYRICA) 75 MG capsule Take 1 capsule (75 mg total) by mouth daily.    . RABEprazole (ACIPHEX) 20 MG tablet TAKE 1 TABLET TWICE DAILY 180 tablet 3  . RESTASIS 0.05 % ophthalmic emulsion Place 1 drop into both eyes 2 (two) times daily.     . risperiDONE (RISPERDAL) 0.5 MG tablet Take 1 tablet (0.5 mg total) by mouth at bedtime. 90 tablet 2  . rosuvastatin (CRESTOR) 5 MG tablet TAKE 1 TABLET AT BEDTIME 90 tablet 2  . sertraline (ZOLOFT) 100 MG tablet Take 1 tablet (100  mg total) by mouth daily. 90 tablet 2  . SHINGRIX injection     . STIOLTO RESPIMAT 2.5-2.5 MCG/ACT AERS INHALE 2 PUFFS INTO THE LUNGS DAILY. 12 g 3  . traZODone (  DESYREL) 100 MG tablet Take 1 tablet (100 mg total) by mouth at bedtime. 90 tablet 2   No current facility-administered medications for this visit.     Musculoskeletal: Strength & Muscle Tone: within normal limits Gait & Station: normal Patient leans: N/A  Psychiatric Specialty Exam: Review of Systems  Musculoskeletal: Positive for arthralgias, back pain, joint swelling and neck pain.  All other systems reviewed and are negative.   There were no vitals taken for this visit.There is no height or weight on file to calculate BMI.  General Appearance: NA  Eye Contact:  NA  Speech:  Clear and Coherent  Volume:  Normal  Mood:  Anxious  Affect:  NA  Thought Process:  Goal Directed  Orientation:  Full (Time, Place, and Person)  Thought Content: Rumination   Suicidal Thoughts:  No  Homicidal Thoughts:  No  Memory:  Immediate;   Good Recent;   Good Remote;   Fair  Judgement:  Good  Insight:  Fair  Psychomotor Activity:  Decreased  Concentration:  Concentration: Good and Attention Span: Good  Recall:  Good  Fund of Knowledge: Good  Language: Good  Akathisia:  No  Handed:  Right  AIMS (if indicated): not done  Assets:  Communication Skills Desire for Improvement Resilience Social Support Talents/Skills  ADL's:  Intact  Cognition: WNL  Sleep:  Good   Screenings: Mini-Mental     Office Visit from 02/03/2019 in [facility] Office Visit from 09/08/2015 in [facility] [place] from 07/02/2014 in [facility] from 12/08/2013 in [facility]  Total Score (max 30 points )  27  24  26  27     PHQ2-9     Video Visit from 07/07/2019 in [facility] Patient Outreach Telephone from 05/21/2019 in [facility] Patient Outreach  Telephone from 05/13/2019 in [place] [facility] Video Visit from 04/21/2019 in [facility] Patient Outreach Telephone from 01/15/2019 in [facility]  PHQ-2 Total Score  2  2  2  2  2   PHQ-9 Total Score  13  5  8  3  9        Assessment and Plan: This patient is a 69 year old female with a history of depression and anxiety.  She is still trying to work on smoking cessation so we will continue Wellbutrin XL 150 mg daily for depression along with her Zoloft 100 mg daily for depression.  She will continue trazodone 100 mg at bedtime for sleep, Ativan 0.5 mg 3 times daily for anxiety and Risperdal 0.5 mg at bedtime for mood stabilization and Lamictal 100 mg twice daily also for mood stabilization.  She will return to see me in 3 months   Levonne Spiller, MD 07/23/2019, 2:05 PM

## 2019-07-24 ENCOUNTER — Other Ambulatory Visit: Payer: Self-pay

## 2019-07-24 DIAGNOSIS — J42 Unspecified chronic bronchitis: Secondary | ICD-10-CM | POA: Diagnosis not present

## 2019-07-24 DIAGNOSIS — E039 Hypothyroidism, unspecified: Secondary | ICD-10-CM | POA: Diagnosis not present

## 2019-07-24 DIAGNOSIS — G4733 Obstructive sleep apnea (adult) (pediatric): Secondary | ICD-10-CM | POA: Diagnosis not present

## 2019-07-24 DIAGNOSIS — F250 Schizoaffective disorder, bipolar type: Secondary | ICD-10-CM | POA: Diagnosis not present

## 2019-07-24 DIAGNOSIS — E1165 Type 2 diabetes mellitus with hyperglycemia: Secondary | ICD-10-CM | POA: Diagnosis not present

## 2019-07-24 DIAGNOSIS — M160 Bilateral primary osteoarthritis of hip: Secondary | ICD-10-CM | POA: Diagnosis not present

## 2019-07-24 DIAGNOSIS — D649 Anemia, unspecified: Secondary | ICD-10-CM | POA: Diagnosis not present

## 2019-07-24 DIAGNOSIS — J449 Chronic obstructive pulmonary disease, unspecified: Secondary | ICD-10-CM | POA: Diagnosis not present

## 2019-07-24 DIAGNOSIS — E1142 Type 2 diabetes mellitus with diabetic polyneuropathy: Secondary | ICD-10-CM | POA: Diagnosis not present

## 2019-07-24 DIAGNOSIS — F17210 Nicotine dependence, cigarettes, uncomplicated: Secondary | ICD-10-CM | POA: Diagnosis not present

## 2019-07-24 NOTE — Patient Outreach (Signed)
[facility]) Care Management  07/24/2019  [name] Jan 13, 1951 [id number]   Telephone call to patient to follow up on oral surgeon appointment.  Patient reports her mouth still bothers her some.  She did see the oral surgeon and was placed on another antibiotic and more mouthwash to use.  She has to follow up in two weeks with oral surgeon.  Encouraged patient to continue use her antibiotics as ordered and complete. She verbalized understanding and voices no concerns.    Plan: RN CM will contact patient again in the month of July and patient agreeable.   Jone Baseman, RN, MSN [facility] Care Management Coordinator Direct Line [phone] Cell [phone] Toll Free: [phone]  Fax: [phone]

## 2019-07-27 ENCOUNTER — Other Ambulatory Visit: Payer: Self-pay | Admitting: Family Medicine

## 2019-07-29 DIAGNOSIS — D649 Anemia, unspecified: Secondary | ICD-10-CM | POA: Diagnosis not present

## 2019-07-29 DIAGNOSIS — F250 Schizoaffective disorder, bipolar type: Secondary | ICD-10-CM | POA: Diagnosis not present

## 2019-07-29 DIAGNOSIS — J449 Chronic obstructive pulmonary disease, unspecified: Secondary | ICD-10-CM | POA: Diagnosis not present

## 2019-07-29 DIAGNOSIS — F17210 Nicotine dependence, cigarettes, uncomplicated: Secondary | ICD-10-CM | POA: Diagnosis not present

## 2019-07-29 DIAGNOSIS — E1165 Type 2 diabetes mellitus with hyperglycemia: Secondary | ICD-10-CM | POA: Diagnosis not present

## 2019-07-29 DIAGNOSIS — E1142 Type 2 diabetes mellitus with diabetic polyneuropathy: Secondary | ICD-10-CM | POA: Diagnosis not present

## 2019-07-29 DIAGNOSIS — J42 Unspecified chronic bronchitis: Secondary | ICD-10-CM | POA: Diagnosis not present

## 2019-07-29 DIAGNOSIS — M160 Bilateral primary osteoarthritis of hip: Secondary | ICD-10-CM | POA: Diagnosis not present

## 2019-07-29 DIAGNOSIS — E039 Hypothyroidism, unspecified: Secondary | ICD-10-CM | POA: Diagnosis not present

## 2019-07-31 DIAGNOSIS — M160 Bilateral primary osteoarthritis of hip: Secondary | ICD-10-CM | POA: Diagnosis not present

## 2019-07-31 DIAGNOSIS — E1142 Type 2 diabetes mellitus with diabetic polyneuropathy: Secondary | ICD-10-CM | POA: Diagnosis not present

## 2019-07-31 DIAGNOSIS — E1165 Type 2 diabetes mellitus with hyperglycemia: Secondary | ICD-10-CM | POA: Diagnosis not present

## 2019-07-31 DIAGNOSIS — J449 Chronic obstructive pulmonary disease, unspecified: Secondary | ICD-10-CM | POA: Diagnosis not present

## 2019-07-31 DIAGNOSIS — F17210 Nicotine dependence, cigarettes, uncomplicated: Secondary | ICD-10-CM | POA: Diagnosis not present

## 2019-07-31 DIAGNOSIS — D649 Anemia, unspecified: Secondary | ICD-10-CM | POA: Diagnosis not present

## 2019-07-31 DIAGNOSIS — F250 Schizoaffective disorder, bipolar type: Secondary | ICD-10-CM | POA: Diagnosis not present

## 2019-07-31 DIAGNOSIS — J42 Unspecified chronic bronchitis: Secondary | ICD-10-CM | POA: Diagnosis not present

## 2019-07-31 DIAGNOSIS — E039 Hypothyroidism, unspecified: Secondary | ICD-10-CM | POA: Diagnosis not present

## 2019-07-31 NOTE — Progress Notes (Signed)
Subjective: [name] presents today preventative diabetic foot care and painful porokeratotic lesion(s) b/l feet and painful mycotic toenails b/l that limit ambulation. Aggravating factors include weightbearing with and without shoe gear. Pain for both is relieved with periodic professional debridement.  [name] Helper, MD is patient's PCP. Last visit was: 06/17/2019.  Past Medical History:  Diagnosis Date  . Allergy   . Anemia   . Anxiety    takes Ativan daily  . Arthritis   . Assistance needed for mobility   . Bipolar disorder ([place])    takes Risperdal nightly  . Blood transfusion   . Cancer ([place])    In her gum  . Carpal tunnel syndrome of right wrist 05/23/2011  . Cervical disc disorder with radiculopathy of cervical region 10/31/2012  . Chronic back pain   . Chronic idiopathic constipation   . Chronic neck and back pain   . Colon polyps   . COPD (chronic obstructive pulmonary disease) with chronic bronchitis ([place]) 09/16/2013   Office Spirometry 10/30/2013-submaximal effort based on appearance of loop and curve. Numbers would fit with severe restriction but her physiologic capability may be better than this. FVC 0.91/44%, and 10.74/45%, FEV1/FVC 0.81, FEF 25-75% 1.43/69%    . Diabetes mellitus    Type II  . Diverticulosis    TCS 9/08 by Dr. Delfin Edis for diarrhea . Bx for micro scopic colitis negative.   . Fibromyalgia   . Frequent falls   . GERD (gastroesophageal reflux disease)    takes Aciphex daily  . Glaucoma    eye drops daily  . Gum symptoms    infection on antibiotic  . Hemiplegia affecting non-dominant side, post-stroke 08/02/2011  . Hiatal hernia   . Hyperlipidemia    takes Crestor daily  . Hypertension    takes Amlodipine,Metoprolol,and Clonidine daily  . Hypothyroidism    takes Synthroid daily  . IBS (irritable bowel syndrome)   . Insomnia    takes Trazodone nightly  . Major depression, recurrent ([place])    takes Zoloft daily  . Malignant  hyperpyrexia 04/25/2017  . Metabolic encephalopathy 06/24/2393  . Migraines    chronic headaches  . Mononeuritis lower limb   . Narcolepsy   . Osteoporosis   . Pancreatitis 2006   due to Depakote with normal EUS   . Schatzki's ring    non critical / EGD with ED 8/2011with RMR  . Seizures ([place])    takes Lamictal daily.Last seizure 3 yrs ago  . Sleep apnea    on CPAP  . Small bowel obstruction ([place])   . Stroke [facility])    left sided weakness, speech changes  . Tubular adenoma of colon      Current Outpatient Medications on File Prior to Visit  Medication Sig Dispense Refill  . ACCU-CHEK AVIVA PLUS test strip CHECK BLOOD SUGAR FOUR TIMES DAILY 400 strip 11  . albuterol (VENTOLIN HFA) 108 (90 Base) MCG/ACT inhaler INHALE 1 TO 2 PUFFS EVERY 6 HOURS AS NEEDED FOR WHEEZING, SHORTNESS OF BREATH 18 g 3  . alendronate (FOSAMAX) 70 MG tablet Take 1 tablet (70 mg total) by mouth every 7 (seven) days. Take with a full glass of water on an empty stomach. 12 tablet 3  . amLODipine (NORVASC) 10 MG tablet TAKE 1 TABLET EVERY DAY 90 tablet 3  . ascorbic acid (VITAMIN C) 500 MG tablet Take 500 mg by mouth daily.    [name] aspirin EC 81 MG tablet Take 1 tablet (81 mg total)  by mouth daily with breakfast. 120 tablet 2  . blood glucose meter kit and supplies Dispense based on patient and insurance preference. Use up to four times daily as directed. (FOR ICD-10 E10.9, E11.9). 1 each 0  . cetirizine (ZYRTEC) 10 MG tablet Take 1 tablet (10 mg total) by mouth daily. 7 tablet 0  . chlorhexidine (PERIDEX) 0.12 % solution Use as directed 15 mLs in the mouth or throat 2 (two) times daily. 473 mL 0  . clobetasol cream (TEMOVATE) 9.60 % Apply 1 application topically 2 (two) times daily.     . Continuous Blood Gluc Sensor (FREESTYLE LIBRE 14 DAY SENSOR) MISC 1 each by Does not apply route every 14 (fourteen) days. Change every 2 weeks 2 each 11  . cromolyn (NASALCROM) 5.2 MG/ACT nasal spray Place 1 spray into both nostrils 4  (four) times daily. 13 mL 1  . diclofenac sodium (VOLTAREN) 1 % GEL APPLY 2 GRAMS  TOPICALLY 4 (FOUR) TIMES DAILY. 200 g 1  . diclofenac Sodium (VOLTAREN) 1 % GEL APPLY 2 GRAMS  TOPICALLY 4 (FOUR) TIMES DAILY. 800 g 0  . dicyclomine (BENTYL) 10 MG capsule Take 1 capsule (10 mg total) by mouth 3 (three) times daily as needed for spasms. 90 capsule 2  . ezetimibe (ZETIA) 10 MG tablet Take 1 tablet (10 mg total) by mouth daily. 90 tablet 1  . hydroxychloroquine (PLAQUENIL) 200 MG tablet Take 200 mg by mouth 2 (two) times daily.    . hydrOXYzine (ATARAX/VISTARIL) 25 MG tablet Take 1 tablet (25 mg total) by mouth daily as needed for itching. 15 tablet 0  . insulin aspart (NOVOLOG FLEXPEN) 100 UNIT/ML FlexPen INJECT 10 TO 14 UNITS INTO THE SKIN 3 (THREE) TIMES DAILY WITH MEALS. 45 mL 3  . Insulin Glargine, 2 Unit Dial, (TOUJEO MAX SOLOSTAR) 300 UNIT/ML SOPN Inject 25 Units into the skin at bedtime. 9 pen 3  . Lancets (ACCU-CHEK SOFT TOUCH) lancets Use as instructed to check blood sugar 3 times a day. 100 each 12  . linaclotide (LINZESS) 72 MCG capsule Take 1 capsule (72 mcg total) by mouth daily before breakfast. Pharmacy-please d/c linzess 145 mcg dosage 30 capsule 2  . lubiprostone (AMITIZA) 24 MCG capsule Take 1 capsule (24 mcg total) by mouth 2 (two) times daily with a meal. 60 capsule 2  . metaxalone (SKELAXIN) 800 MG tablet Take 800 mg by mouth 3 (three) times daily.    . metoprolol tartrate (LOPRESSOR) 50 MG tablet TAKE 1 TABLET TWICE DAILY (NEED MD APPOINTMENT) 180 tablet 3  . NEOMYCIN-POLYMYXIN-HC, OPHTH, SUSP neomycin 3.5 mg-polymyxin 10,000 unit-hydrocort 10 mg/mL eye drop,susp    . nystatin (MYCOSTATIN/NYSTOP) powder APPLY TO AFFECTED AREA 4 TIMES DAILY. 90 g 1  . Omega-3 Fatty Acids (FISH OIL PO) Take 1 capsule by mouth daily.    . ondansetron (ZOFRAN) 4 MG tablet Take 1 tablet (4 mg total) by mouth every 8 (eight) hours as needed for nausea or vomiting. 20 tablet 0  .  oxyCODONE-acetaminophen (PERCOCET) 10-325 MG tablet Take 1 tablet by mouth every 8 (eight) hours as needed.     . pregabalin (LYRICA) 75 MG capsule Take 1 capsule (75 mg total) by mouth daily.    . RABEprazole (ACIPHEX) 20 MG tablet TAKE 1 TABLET TWICE DAILY 180 tablet 3  . RESTASIS 0.05 % ophthalmic emulsion Place 1 drop into both eyes 2 (two) times daily.     . rosuvastatin (CRESTOR) 5 MG tablet TAKE 1 TABLET AT BEDTIME 90  tablet 2  . SHINGRIX injection     . STIOLTO RESPIMAT 2.5-2.5 MCG/ACT AERS INHALE 2 PUFFS INTO THE LUNGS DAILY. 12 g 3   No current facility-administered medications on file prior to visit.     Allergies  Allergen Reactions  . Cephalexin Hives  . Iron Nausea And Vomiting  . Milk-Related Compounds Other (See Comments)    Doesn't agree with stomach.   . Penicillins Hives    Has patient had a PCN reaction causing immediate rash, facial/tongue/throat swelling, SOB or lightheadedness with hypotension: Yes Has patient had a PCN reaction causing severe rash involving mucus membranes or skin necrosis: No Has patient had a PCN reaction that required hospitalization No Has patient had a PCN reaction occurring within the last 10 years: No If all of the above answers are "NO", then may proceed with Cephalosporin use.   [name] Phenazopyridine Hcl Hives          Objective: Feige Lowdermilk Lormand is a pleasant 69 y.o. y.o. Patient Race: Black or African American [2]  female in NAD. AAO x 3.  Vitals:   07/22/19 1318  Temp: 97.6 F (36.4 C)   Vascular Examination: Neurovascular status unchanged b/l. Capillary refill time to digits immediate b/l. Faintly palpable DP pulses b/l. Faintly palpable PT pulses b/l. Pedal hair sparse b/l. Skin temperature gradient within normal limits b/l.  Dermatological Examination: Pedal skin with normal turgor, texture and tone bilaterally. No open wounds bilaterally. No interdigital macerations bilaterally. Toenails 1-5 right, L hallux, L 3rd toe, L 4th  toe and L 5th toe elongated, dystrophic, thickened, and crumbly with subungual debris and tenderness to dorsal palpation. Porokeratotic lesion(s) plantarlateral right foot and plantar aspect left heel. No erythema, no edema, no drainage, no flocculence.  Musculoskeletal: Normal muscle strength 5/5 to all lower extremity muscle groups bilaterally. No pain crepitus or joint limitation noted with ROM b/l. Hallux valgus with bunion deformity noted b/l. Utilizes cane for ambulation assistance.  Neurological Examination: Protective sensation diminished with 10g monofilament b/l. Proprioception intact bilaterally.  Assessment: 1. Pain due to onychomycosis of toenail   2. Porokeratosis   3. Diabetic polyneuropathy associated with type 2 diabetes mellitus ([place])    Plan: -Examined patient. -No new findings. No new orders. -Continue diabetic foot care principles. Literature dispensed on today.  -Toenails 1-5 b/l were debrided in length and girth with sterile nail nippers and dremel without iatrogenic bleeding.  -Painful porokeratotic lesion(s) plantarlateral right foot, submet head 1 right foot and plantar aspect left heel pared and enucleated with sterile scalpel blade without incident. -Patient to continue soft, supportive shoe gear daily. -Patient to report any pedal injuries to medical professional immediately. -Patient/POA to call should there be question/concern in the interim.  Return in about 3 months (around 10/22/2019) for diabetic nail and callus trim.  Marzetta Board, DPM

## 2019-08-04 ENCOUNTER — Ambulatory Visit (INDEPENDENT_AMBULATORY_CARE_PROVIDER_SITE_OTHER): Payer: Medicare HMO | Admitting: Psychiatry

## 2019-08-04 ENCOUNTER — Other Ambulatory Visit: Payer: Self-pay

## 2019-08-04 DIAGNOSIS — E039 Hypothyroidism, unspecified: Secondary | ICD-10-CM | POA: Diagnosis not present

## 2019-08-04 DIAGNOSIS — J449 Chronic obstructive pulmonary disease, unspecified: Secondary | ICD-10-CM | POA: Diagnosis not present

## 2019-08-04 DIAGNOSIS — D649 Anemia, unspecified: Secondary | ICD-10-CM | POA: Diagnosis not present

## 2019-08-04 DIAGNOSIS — F17210 Nicotine dependence, cigarettes, uncomplicated: Secondary | ICD-10-CM | POA: Diagnosis not present

## 2019-08-04 DIAGNOSIS — M160 Bilateral primary osteoarthritis of hip: Secondary | ICD-10-CM | POA: Diagnosis not present

## 2019-08-04 DIAGNOSIS — F331 Major depressive disorder, recurrent, moderate: Secondary | ICD-10-CM

## 2019-08-04 DIAGNOSIS — E1142 Type 2 diabetes mellitus with diabetic polyneuropathy: Secondary | ICD-10-CM | POA: Diagnosis not present

## 2019-08-04 DIAGNOSIS — E1165 Type 2 diabetes mellitus with hyperglycemia: Secondary | ICD-10-CM | POA: Diagnosis not present

## 2019-08-04 DIAGNOSIS — F250 Schizoaffective disorder, bipolar type: Secondary | ICD-10-CM | POA: Diagnosis not present

## 2019-08-04 DIAGNOSIS — J42 Unspecified chronic bronchitis: Secondary | ICD-10-CM | POA: Diagnosis not present

## 2019-08-04 NOTE — Progress Notes (Signed)
Virtual Visit via Telephone Note  I connected with [name] on 08/04/19 at 4:10 PM EDTby telephone and verified that I am speaking with the correct person using two identifiers.   I discussed the limitations, risks, security and privacy concerns of performing an evaluation and management service by telephone and the availability of in person appointments. I also discussed with the patient that there may be a patient responsible charge related to this service. The patient expressed understanding and agreed to proceed.  The patient was advised to call back or seek an in-person evaluation if the symptoms worsen or if the condition fails to improve as anticipated.  I provided 44 minutes of non-face-to-face time during this encounter.   [name], LCSW            THERAPIST PROGRESS NOTE  Session Time: Tuesday 08/04/2019 4:10 PM -  4:54 PM       Participation Level: Active  Behavioral Response:  depressed, anxious  Type of Therapy: Individual Therapy  Treatment Goals addressed: Learn and implement behavioral strategies to overcome depression.                                                       Verbalize an understanding and resolution of current interpersonal problems.   Interventions: CBT and Supportive   Summary: [name] is a 69 y.o. female who presents with  long standing history of recurrent periods  of depression beginning when she was 26 and her favorite uncle died. Patient reports multiple psychiatric hospitlaizations due to depression and suicidal ideations with the last one occuring in 1997. Patient has participated in outpatient psychotherapy and medication management intermittently since age 35. She currently is seeing psychiatrist Dr. Harrington Challenger . Prior to this, she was seen at [facility] Patient also has had ECT at [name]. Symptoms have worsened in recent months due to family stress and have  included depressed mood, anxiety, excessive worry, and tearfulness.          Patient's last contact was by virtual visit via telephone about  3- 4 weeks ago. She continues to experience moderate symptoms of depression and reports increased irritability. She expresses frustration she can't have shoulder surgery but expresses increased acceptance of this.  She reports trying to do what she can at different pain levels. She reports trying to do some tasks at home and reports recently preparing self a meal.  She has increased attendance at church and reports this is very helpful.  She reports being very excited about going to church.  She reports increased support from her pastor and her brother.  She reports decreased stress as she used assertiveness skills and fired her aide.  She now has a new aide and reports this relationship is going well so far.  Patient also expresses increased acceptance of her relationship with her son and reports trying to stay focused on herself.  She continues to express disappointment with his choices.  She continues to have a positive relationship with her grandson and continues to try to nurture that relationship as well as her relationship with a friend.   Suicidal/Homicidal: Nowithout intent/plan  Therapist Response: Reviewed symptoms,   discussed stressors, facilitated expression of thoughts and feelings, validated feelings,praised and reinforced patient's use of assertiveness skills, praised and reinforced patient's efforts to engage  in activity particularly her attendance at church, discussed the effects on her mood and her perception of pain, assisted patient identify pleasurable activities to do at home to help her cope with pain (online services, bible study, listen to music, talk with a friend). Plan: Return again in 2 weeks.  Diagnosis: Axis I: MDD, Recurrent, moderate    Axis II: Deferred    [name] E [name] Reamer, LCSW

## 2019-08-05 ENCOUNTER — Other Ambulatory Visit: Payer: Self-pay | Admitting: *Deleted

## 2019-08-05 DIAGNOSIS — E039 Hypothyroidism, unspecified: Secondary | ICD-10-CM | POA: Diagnosis not present

## 2019-08-05 DIAGNOSIS — J449 Chronic obstructive pulmonary disease, unspecified: Secondary | ICD-10-CM | POA: Diagnosis not present

## 2019-08-05 DIAGNOSIS — F250 Schizoaffective disorder, bipolar type: Secondary | ICD-10-CM | POA: Diagnosis not present

## 2019-08-05 DIAGNOSIS — M160 Bilateral primary osteoarthritis of hip: Secondary | ICD-10-CM | POA: Diagnosis not present

## 2019-08-05 DIAGNOSIS — E1142 Type 2 diabetes mellitus with diabetic polyneuropathy: Secondary | ICD-10-CM | POA: Diagnosis not present

## 2019-08-05 DIAGNOSIS — D649 Anemia, unspecified: Secondary | ICD-10-CM | POA: Diagnosis not present

## 2019-08-05 DIAGNOSIS — F17210 Nicotine dependence, cigarettes, uncomplicated: Secondary | ICD-10-CM | POA: Diagnosis not present

## 2019-08-05 DIAGNOSIS — J42 Unspecified chronic bronchitis: Secondary | ICD-10-CM | POA: Diagnosis not present

## 2019-08-05 DIAGNOSIS — E1165 Type 2 diabetes mellitus with hyperglycemia: Secondary | ICD-10-CM | POA: Diagnosis not present

## 2019-08-05 MED ORDER — BD SWAB SINGLE USE REGULAR PADS: MEDICATED_PAD | 4 refills | Status: DC

## 2019-08-05 MED ORDER — ACCU-CHEK GUIDE VI STRP: ORAL_STRIP | 4 refills | Status: DC

## 2019-08-05 MED ORDER — ACCU-CHEK GUIDE CONTROL VI LIQD: 1 refills | Status: DC

## 2019-08-05 NOTE — Addendum Note (Signed)
Addended by: Marian Sorrow on: 08/05/2019 11:26 AM   Modules accepted: Orders

## 2019-08-07 ENCOUNTER — Other Ambulatory Visit: Payer: Self-pay | Admitting: Family Medicine

## 2019-08-07 DIAGNOSIS — F250 Schizoaffective disorder, bipolar type: Secondary | ICD-10-CM | POA: Diagnosis not present

## 2019-08-07 DIAGNOSIS — E1165 Type 2 diabetes mellitus with hyperglycemia: Secondary | ICD-10-CM | POA: Diagnosis not present

## 2019-08-07 DIAGNOSIS — D649 Anemia, unspecified: Secondary | ICD-10-CM | POA: Diagnosis not present

## 2019-08-07 DIAGNOSIS — J449 Chronic obstructive pulmonary disease, unspecified: Secondary | ICD-10-CM | POA: Diagnosis not present

## 2019-08-07 DIAGNOSIS — Z1231 Encounter for screening mammogram for malignant neoplasm of breast: Secondary | ICD-10-CM

## 2019-08-07 DIAGNOSIS — M160 Bilateral primary osteoarthritis of hip: Secondary | ICD-10-CM | POA: Diagnosis not present

## 2019-08-07 DIAGNOSIS — E039 Hypothyroidism, unspecified: Secondary | ICD-10-CM | POA: Diagnosis not present

## 2019-08-07 DIAGNOSIS — F17210 Nicotine dependence, cigarettes, uncomplicated: Secondary | ICD-10-CM | POA: Diagnosis not present

## 2019-08-07 DIAGNOSIS — E1142 Type 2 diabetes mellitus with diabetic polyneuropathy: Secondary | ICD-10-CM | POA: Diagnosis not present

## 2019-08-07 DIAGNOSIS — J42 Unspecified chronic bronchitis: Secondary | ICD-10-CM | POA: Diagnosis not present

## 2019-08-08 ENCOUNTER — Other Ambulatory Visit: Payer: Self-pay | Admitting: *Deleted

## 2019-08-08 MED ORDER — EZETIMIBE 10 MG PO TABS: 10.0000 mg | ORAL_TABLET | Freq: Every day | ORAL | 1 refills | Status: DC

## 2019-08-12 ENCOUNTER — Other Ambulatory Visit: Payer: Self-pay

## 2019-08-12 DIAGNOSIS — J449 Chronic obstructive pulmonary disease, unspecified: Secondary | ICD-10-CM | POA: Diagnosis not present

## 2019-08-12 DIAGNOSIS — I10 Essential (primary) hypertension: Secondary | ICD-10-CM | POA: Diagnosis not present

## 2019-08-12 MED ORDER — ACCU-CHEK GUIDE VI STRP: ORAL_STRIP | 11 refills | Status: DC

## 2019-08-12 MED ORDER — BD SWAB SINGLE USE REGULAR PADS: MEDICATED_PAD | 11 refills | Status: DC

## 2019-08-12 MED ORDER — ACCU-CHEK GUIDE CONTROL VI LIQD: 11 refills | Status: DC

## 2019-08-13 ENCOUNTER — Encounter: Payer: Self-pay | Admitting: Internal Medicine

## 2019-08-13 ENCOUNTER — Ambulatory Visit (INDEPENDENT_AMBULATORY_CARE_PROVIDER_SITE_OTHER): Payer: Medicare HMO | Admitting: Internal Medicine

## 2019-08-13 VITALS — BP 126/72 | HR 62 | Ht 59.0 in | Wt 163.1 lb

## 2019-08-13 DIAGNOSIS — K5904 Chronic idiopathic constipation: Secondary | ICD-10-CM | POA: Diagnosis not present

## 2019-08-13 DIAGNOSIS — K5730 Diverticulosis of large intestine without perforation or abscess without bleeding: Secondary | ICD-10-CM | POA: Diagnosis not present

## 2019-08-13 DIAGNOSIS — M6289 Other specified disorders of muscle: Secondary | ICD-10-CM

## 2019-08-13 DIAGNOSIS — K219 Gastro-esophageal reflux disease without esophagitis: Secondary | ICD-10-CM

## 2019-08-13 DIAGNOSIS — R32 Unspecified urinary incontinence: Secondary | ICD-10-CM | POA: Diagnosis not present

## 2019-08-13 MED ORDER — LUBIPROSTONE 8 MCG PO CAPS
8.0000 ug | ORAL_CAPSULE | Freq: Two times a day (BID) | ORAL | 0 refills | Status: DC
Start: 2019-08-13 — End: 2020-01-27

## 2019-08-13 NOTE — Patient Instructions (Signed)
Continue Linzess 72 mcg daily  Change Amitiza dosage to 8 mcg twice daily. (new prescription has been sent to your pharmacy)  Continue all other medications as prescribed.  Please follow up with Dr Hilarie Fredrickson in 3 months (beginning of September).  If you are age 69 or older, your body mass index should be between 23-30. Your Body mass index is 32.95 kg/m. If this is out of the aforementioned range listed, please consider follow up with your Primary Care Provider.  If you are age 36 or younger, your body mass index should be between 19-25. Your Body mass index is 32.95 kg/m. If this is out of the aformentioned range listed, please consider follow up with your Primary Care Provider.   Due to recent changes in healthcare laws, you may see the results of your imaging and laboratory studies on MyChart before your provider has had a chance to review them.  We understand that in some cases there may be results that are confusing or concerning to you. Not all laboratory results come back in the same time frame and the provider may be waiting for multiple results in order to interpret others.  Please give [name] 48 hours in order for your provider to thoroughly review all the results before contacting the office for clarification of your results.

## 2019-08-13 NOTE — Progress Notes (Signed)
   Subjective:    Patient ID: [name], female    DOB: [birth year], 69 y.o.   MRN: [id number]  HPI [name] is a 69 year old female with a history of chronic constipation, pelvic floor dysfunction with history of rectocele and cystocele, colonic diverticulosis, history of colon polyps, GERD, history of SBO, prior cholecystectomy, hypertension, diabetes, sleep apnea and depression who is here for follow-up.  I last saw her on 07/02/2019.  She reports that she is feeling better though sometimes with the change in laxative regimen her stools are too urgent and she experiences multiple somewhat loose bowel movements throughout the day.  At times she does not want to go too far from the restroom over fear for loose stools or accidents.  She is feeling a "hot" feeling in her mid and lower abdomen which does not relate to eating or bowel movement.  This makes her feel like she wants to drink something cold and cold water will ease this discomfort somewhat.  No blood in her stool or melena.  She did stop the MiraLAX but is taking Amitiza 24 mcg twice a day with food and Linzess 72 mcg daily.  She will occasionally use Gas-X for bloating.   Review of Systems As per HPI, otherwise negative  Current Medications, Allergies, Past Medical History, Past Surgical History, Family History and Social History were reviewed in [employer] record.     Objective:   Physical Exam BP 126/72 (BP Location: Right Arm, Patient Position: Sitting, Cuff Size: Normal)   Pulse 62   Ht 4\' 11"  (1.499 m)   Wt 163 lb 2 oz (74 kg)   SpO2 96%   BMI 32.95 kg/m  Gen: awake, alert, NAD HEENT: anicteric, op clear CV: RRR, no mrg Pulm: CTA b/l Abd: soft, NT/ND, +BS throughout Ext: no c/c/e Neuro: nonfocal  2 view abdominal x-ray at last visit normal      Assessment & Plan:   69 year old female with a history of chronic constipation, pelvic floor dysfunction with history of rectocele and cystocele,  colonic diverticulosis, history of colon polyps, GERD, history of SBO, prior cholecystectomy, hypertension, diabetes, sleep apnea and depression who is here for follow-up.  1.  Chronic constipation/colonic diverticulosis/pelvic floor dysfunction --she has improved with dual laxative therapy but the dose of Amitiza may be too high given her days where she will have urgent loose stools throughout the day.  Being off MiraLAX seems to be more effective for her at complete evacuation. --Reduce Amitiza to 8 mcg twice daily with food --Continue daily Linzess 72 mcg --Can use Bentyl 10 mg 3 times daily but only as needed for crampy abdominal discomfort  2.  GERD/intermittent dysphagia --stable symptoms, continue AcipHex 20 mg twice daily before meals  3.  History of adenomatous polyps --surveillance colonoscopy recommended next year, 2022  3 to 27-month follow-up, sooner if needed  30 minutes total spent today including patient facing time, coordination of care, reviewing medical history/procedures/pertinent radiology studies, and documentation of the encounter.

## 2019-08-15 DIAGNOSIS — E1142 Type 2 diabetes mellitus with diabetic polyneuropathy: Secondary | ICD-10-CM | POA: Diagnosis not present

## 2019-08-15 DIAGNOSIS — F17210 Nicotine dependence, cigarettes, uncomplicated: Secondary | ICD-10-CM

## 2019-08-15 DIAGNOSIS — E1165 Type 2 diabetes mellitus with hyperglycemia: Secondary | ICD-10-CM | POA: Diagnosis not present

## 2019-08-15 DIAGNOSIS — J42 Unspecified chronic bronchitis: Secondary | ICD-10-CM

## 2019-08-15 DIAGNOSIS — I10 Essential (primary) hypertension: Secondary | ICD-10-CM

## 2019-08-15 DIAGNOSIS — E039 Hypothyroidism, unspecified: Secondary | ICD-10-CM | POA: Diagnosis not present

## 2019-08-15 DIAGNOSIS — F250 Schizoaffective disorder, bipolar type: Secondary | ICD-10-CM | POA: Diagnosis not present

## 2019-08-15 DIAGNOSIS — J449 Chronic obstructive pulmonary disease, unspecified: Secondary | ICD-10-CM | POA: Diagnosis not present

## 2019-08-15 DIAGNOSIS — M160 Bilateral primary osteoarthritis of hip: Secondary | ICD-10-CM | POA: Diagnosis not present

## 2019-08-15 DIAGNOSIS — D649 Anemia, unspecified: Secondary | ICD-10-CM | POA: Diagnosis not present

## 2019-08-16 DIAGNOSIS — M179 Osteoarthritis of knee, unspecified: Secondary | ICD-10-CM | POA: Diagnosis not present

## 2019-08-16 DIAGNOSIS — I10 Essential (primary) hypertension: Secondary | ICD-10-CM | POA: Diagnosis not present

## 2019-08-16 DIAGNOSIS — Z9181 History of falling: Secondary | ICD-10-CM | POA: Diagnosis not present

## 2019-08-18 ENCOUNTER — Other Ambulatory Visit: Payer: Self-pay

## 2019-08-18 ENCOUNTER — Ambulatory Visit (INDEPENDENT_AMBULATORY_CARE_PROVIDER_SITE_OTHER): Payer: Medicare HMO | Admitting: Psychiatry

## 2019-08-18 DIAGNOSIS — F331 Major depressive disorder, recurrent, moderate: Secondary | ICD-10-CM

## 2019-08-18 NOTE — Progress Notes (Addendum)
Virtual Visit via Telephone Note  I connected with [name] on 08/18/19 at  4:00 PM EDT by telephone and verified that I am speaking with the correct person using two identifiers.   I discussed the limitations, risks, security and privacy concerns of performing an evaluation and management service by telephone and the availability of in person appointments. I also discussed with the patient that there may be a patient responsible charge related to this service. The patient expressed understanding and agreed to proceed.  I provided 45 minutes of non-face-to-face time during this encounter.   [name], LCSW            THERAPIST PROGRESS NOTE  Location: [facility]-home [name] [place] office  Session Time: Monday 08/18/2019 4:00 PM - 4:45 PM   Participation Level: Active  Behavioral Response:  depressed, anxious  Type of Therapy: Individual Therapy  Treatment Goals addressed: Learn and implement behavioral strategies to overcome depression.                                                       Verbalize an understanding and resolution of current interpersonal problems.   Interventions: CBT and Supportive   Summary: [name] is a 69 y.o. female who presents with  long standing history of recurrent periods  of depression beginning when she was 80 and her favorite uncle died. Patient reports multiple psychiatric hospitlaizations due to depression and suicidal ideations with the last one occuring in 1997. Patient has participated in outpatient psychotherapy and medication management intermittently since age 76. She currently is seeing psychiatrist Dr. Harrington Challenger . Prior to this, she was seen at [facility]. Patient also has had ECT at [facility]. Symptoms have worsened in recent months due to family stress and have  included depressed mood, anxiety, excessive worry, and tearfulness.         Patient's last contact was by virtual visit via telephone about  3- 4  weeks ago. She experiences minimal symptoms of depression and continued irritability.  She reports decreased crying spells.  She continues to express disappointment with her family (son and grandson) as she reports feeling used.  However, she expresses increased acceptance of their choices.  She continues to try to nurture other relationships and stay involved in other activities including talking with a friend regularly and attending church.  She also reconnected with a friend via [place].  She continues to express frustration about chronic pain but expresses determination to not be overwhelmed by the pain.  She reports continued efforts to quit smoking and says she now smokes about 7 cigarettes/day.    Suicidal/Homicidal: Nowithout intent/plan  Therapist Response: Reviewed symptoms, praised and reinforced patient's increased behavioral activation/improved self care/use of self talk, discussed effects, discussed stressors, facilitated expression of thoughts and feelings, validated feelings, assisted patient identify ways to set and maintain boundaries in the relationship with her son and grandson   Plan: Return again in 2 weeks.  Diagnosis: Axis I: MDD, Recurrent, moderate    Axis II: Deferred    Adrick Kestler E Ragnar Waas, LCSW

## 2019-08-19 ENCOUNTER — Encounter (HOSPITAL_COMMUNITY): Payer: Self-pay | Admitting: Psychiatry

## 2019-08-19 ENCOUNTER — Telehealth (INDEPENDENT_AMBULATORY_CARE_PROVIDER_SITE_OTHER): Payer: Medicare HMO | Admitting: Psychiatry

## 2019-08-19 ENCOUNTER — Other Ambulatory Visit: Payer: Self-pay

## 2019-08-19 DIAGNOSIS — F250 Schizoaffective disorder, bipolar type: Secondary | ICD-10-CM | POA: Diagnosis not present

## 2019-08-19 DIAGNOSIS — E1142 Type 2 diabetes mellitus with diabetic polyneuropathy: Secondary | ICD-10-CM | POA: Diagnosis not present

## 2019-08-19 DIAGNOSIS — E039 Hypothyroidism, unspecified: Secondary | ICD-10-CM | POA: Diagnosis not present

## 2019-08-19 DIAGNOSIS — J42 Unspecified chronic bronchitis: Secondary | ICD-10-CM | POA: Diagnosis not present

## 2019-08-19 DIAGNOSIS — J449 Chronic obstructive pulmonary disease, unspecified: Secondary | ICD-10-CM | POA: Diagnosis not present

## 2019-08-19 DIAGNOSIS — M160 Bilateral primary osteoarthritis of hip: Secondary | ICD-10-CM | POA: Diagnosis not present

## 2019-08-19 DIAGNOSIS — E1165 Type 2 diabetes mellitus with hyperglycemia: Secondary | ICD-10-CM | POA: Diagnosis not present

## 2019-08-19 DIAGNOSIS — D649 Anemia, unspecified: Secondary | ICD-10-CM | POA: Diagnosis not present

## 2019-08-19 DIAGNOSIS — F331 Major depressive disorder, recurrent, moderate: Secondary | ICD-10-CM

## 2019-08-19 DIAGNOSIS — F17210 Nicotine dependence, cigarettes, uncomplicated: Secondary | ICD-10-CM | POA: Diagnosis not present

## 2019-08-19 NOTE — Progress Notes (Signed)
Virtual Visit via Telephone Note  I connected with [name] Andreatta on 08/19/19 at  2:00 PM EDT by telephone and verified that I am speaking with the correct person using two identifiers.   I discussed the limitations, risks, security and privacy concerns of performing an evaluation and management service by telephone and the availability of in person appointments. I also discussed with the patient that there may be a patient responsible charge related to this service. The patient expressed understanding and agreed to proceed.    I discussed the assessment and treatment plan with the patient. The patient was provided an opportunity to ask questions and all were answered. The patient agreed with the plan and demonstrated an understanding of the instructions.   The patient was advised to call back or seek an in-person evaluation if the symptoms worsen or if the condition fails to improve as anticipated.  I provided 15 minutes of non-face-to-face time during this encounter. Location: Provider office, patient home  [name] Spiller, MD  [facility] MD/PA/NP OP Progress Note  08/19/2019 2:16 PM [name]  MRN:  [id number]  Chief Complaint:  Chief Complaint    Depression; Anxiety; Follow-up     HPI: This patient is a 69 year old divorced black female who lives alone in [place].  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  The patient returns for follow-up after 4 weeks due to her history of depression and anxiety.  She is still having a lot of pain issues in her back neck and shoulder.  Her mouth is healing from her gum infection.  She is still working on smoking cessation and is down to about 7 cigarettes a day.  She continues the bupropion which should help with the cravings.  She states that at times she gets very angry with family members but this is because she is in pain.  Her pain is under fairly good control right now.  She denies severe anxiety or thoughts of  self-harm or suicide and is sleeping well. Visit Diagnosis:    ICD-10-CM   1. Major depressive disorder, recurrent episode, moderate (HCC)  F33.1     Past Psychiatric History: Numerous hospitalizations for depression years ago  Past Medical History:  Past Medical History:  Diagnosis Date  . Allergy   . Anemia   . Anxiety    takes Ativan daily  . Arthritis   . Assistance needed for mobility   . Bipolar disorder ([place])    takes Risperdal nightly  . Blood transfusion   . Cancer ([place])    In her gum  . Carpal tunnel syndrome of right wrist 05/23/2011  . Cervical disc disorder with radiculopathy of cervical region 10/31/2012  . Chronic back pain   . Chronic idiopathic constipation   . Chronic neck and back pain   . Colon polyps   . COPD (chronic obstructive pulmonary disease) with chronic bronchitis ([place]) 09/16/2013   Office Spirometry 10/30/2013-submaximal effort based on appearance of loop and curve. Numbers would fit with severe restriction but her physiologic capability may be better than this. FVC 0.91/44%, and 10.74/45%, FEV1/FVC 0.81, FEF 25-75% 1.43/69%    . Diabetes mellitus    Type II  . Diverticulosis    TCS 9/08 by Dr. Delfin Edis for diarrhea . Bx for micro scopic colitis negative.   . Fibromyalgia   . Frequent falls   . GERD (gastroesophageal reflux disease)    takes Aciphex daily  . Glaucoma  eye drops daily  . Gum symptoms    infection on antibiotic  . Hemiplegia affecting non-dominant side, post-stroke 08/02/2011  . Hiatal hernia   . Hyperlipidemia    takes Crestor daily  . Hypertension    takes Amlodipine,Metoprolol,and Clonidine daily  . Hypothyroidism    takes Synthroid daily  . IBS (irritable bowel syndrome)   . Insomnia    takes Trazodone nightly  . Major depression, recurrent ([place])    takes Zoloft daily  . 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 Metabolic encephalopathy 1/61/0960  . Migraines    chronic headaches  . Mononeuritis lower limb   .  Narcolepsy   . Osteoporosis   . Pancreatitis 2006   due to Depakote with normal EUS   . Schatzki's ring    non critical / EGD with ED 8/2011with RMR  . Seizures ([place])    takes Lamictal daily.Last seizure 3 yrs ago  . Sleep apnea    on CPAP  . Small bowel obstruction ([place])   . Stroke [facility])    left sided weakness, speech changes  . Tubular adenoma of colon     Past Surgical History:  Procedure Laterality Date  . ABDOMINAL HYSTERECTOMY  1978  . BACK SURGERY  July 2012  . BACTERIAL OVERGROWTH TEST N/A 05/05/2013   Procedure: BACTERIAL OVERGROWTH TEST;  Surgeon: Daneil Dolin, MD;  [facility];  Service: Endoscopy;  Laterality: N/A;  7:30  . BIOPSY THYROID  2009  . BRAIN SURGERY  11/2011   resection of meningioma  . BREAST REDUCTION SURGERY  1994  . CARDIAC CATHETERIZATION  05/10/2005   normal coronaries, normal LV systolic function and EF (Dr. Jackie Plum)  . CARPAL TUNNEL RELEASE Left 07/22/04   Dr. Aline Brochure  . CATARACT EXTRACTION Bilateral   . CHOLECYSTECTOMY  1984  . COLONOSCOPY N/A 09/25/2012   AVW:[id number] diverticulosis.  colonic polyp-removed : tubular adenoma  . CRANIOTOMY  11/23/2011   Procedure: CRANIOTOMY TUMOR EXCISION;  Surgeon: Hosie Spangle, MD;  Location: [facility];  Service: Neurosurgery;  Laterality: N/A;  Craniotomy for tumor resection  . ESOPHAGOGASTRODUODENOSCOPY  12/29/2010   Rourk-Retained food in the esophagus and stomach, small hiatal hernia, status post Maloney dilation of the esophagus  . ESOPHAGOGASTRODUODENOSCOPY N/A 09/25/2012   YNW:[id number] atonic baggy esophagus status post Maloney dilation 37 F. Hiatal hernia  . GIVENS CAPSULE STUDY N/A 01/15/2013   NORMAL.   . IR GENERIC HISTORICAL  03/17/2016   IR RADIOLOGIST EVAL & MGMT 03/17/2016 [facility]-INTERV RAD  . LESION REMOVAL N/A 05/31/2015   Procedure: REMOVAL RIGHT AND LEFT LESIONS OF MANDIBLE;  Surgeon: Diona Browner, DDS;  Location: [place];  Service: Oral Surgery;  Laterality: N/A;  . MALONEY  DILATION  12/29/2010   RMR;  . NM MYOCAR PERF WALL MOTION  2006   "relavtiely normal" persantine, mild anterior thinning (breast attenuation artifact), no region of scar/ischemia  . OVARIAN CYST REMOVAL    . RECTOCELE REPAIR N/A 06/29/2015   Procedure: POSTERIOR REPAIR (RECTOCELE);  Surgeon: Jonnie Kind, MD;  Location: [facility];  Service: Gynecology;  Laterality: N/A;  . REDUCTION MAMMAPLASTY Bilateral   . SPINE SURGERY  09/29/2010   Dr. Rolena Infante  . surgical excision of 3 tumors from right thigh and right buttock  and left upper thigh  2010  . TOOTH EXTRACTION Bilateral 12/14/2014   Procedure: REMOVAL OF BILATERAL MANDIBULAR EXOSTOSES;  Surgeon: Diona Browner, DDS;  Location: [facility];  Service: Oral Surgery;  Laterality: Bilateral;  .  TRANSTHORACIC ECHOCARDIOGRAM  2010   EF 60-65%, mild conc LVH, grade 1 diastolic dysfunction; mildly calcified MV annulus with mildly thickened leaflets, mildly calcified MR annulus    Family Psychiatric History: see below  Family History:  Family History  Problem Relation Age of Onset  . Heart attack Mother        HTN  . Pneumonia Father   . Kidney failure Father   . Diabetes Father   . Pancreatic cancer Sister   . Diabetes Brother   . Hypertension Brother   . Diabetes Brother   . Cancer Sister        breast   . Hypertension Son   . Sleep apnea Son   . Cancer Sister        pancreatic  . Stroke Maternal Grandmother   . Heart attack Maternal Grandfather   . Alcohol abuse Maternal Uncle   . Colon cancer Neg Hx   . Anesthesia problems Neg Hx   . Hypotension Neg Hx   . Malignant hyperthermia Neg Hx   . Pseudochol deficiency Neg Hx   . Breast cancer Neg Hx     Social History:  Social History   Socioeconomic History  . Marital status: Divorced    Spouse name: Not on file  . Number of children: 1  . Years of education: 95  . Highest education level: High school graduate  Occupational History  . Occupation: Disabled  Tobacco Use  . Smoking  status: Current Every Day Smoker    Packs/day: 0.50    Years: 30.00    Pack years: 15.00    Types: Cigarettes  . Smokeless tobacco: Never Used  . Tobacco comment: 5-10 cigs a day  Substance and Sexual Activity  . Alcohol use: No    Alcohol/week: 0.0 standard drinks    Comment:    . Drug use: No  . Sexual activity: Not Currently  Other Topics Concern  . Not on file  Social History Narrative   01/29/18 Lives alone, has 3 aides, Mon- Fri 8 hrs, 2 hrs on Sat-Sun, RN manages her meds   Caffeine use: Drink coffee sometimes    Right handed    Social Determinants of Health   Financial Resource Strain: Low Risk   . Difficulty of Paying Living Expenses: Not very hard  Food Insecurity: No Food Insecurity  . Worried About Charity fundraiser in the Last Year: Never true  . Ran Out of Food in the Last Year: Never true  Transportation Needs: No Transportation Needs  . Lack of Transportation (Medical): No  . Lack of Transportation (Non-Medical): No  Physical Activity: Inactive  . Days of Exercise per Week: 0 days  . Minutes of Exercise per Session: 0 min  Stress: Stress Concern Present  . Feeling of Stress : To some extent  Social Connections: Somewhat Isolated  . Frequency of Communication with Friends and Family: More than three times a week  . Frequency of Social Gatherings with Friends and Family: More than three times a week  . Attends Religious Services: More than 4 times per year  . Active Member of Clubs or Organizations: No  . Attends Archivist Meetings: Never  . Marital Status: Divorced    Allergies:  Allergies  Allergen Reactions  . Cephalexin Hives  . Iron Nausea And Vomiting  . Milk-Related Compounds Other (See Comments)    Doesn't agree with stomach.   . Penicillins Hives    Has patient had a PCN  reaction causing immediate rash, facial/tongue/throat swelling, SOB or lightheadedness with hypotension: Yes Has patient had a PCN reaction causing severe rash  involving mucus membranes or skin necrosis: No Has patient had a PCN reaction that required hospitalization No Has patient had a PCN reaction occurring within the last 10 years: No If all of the above answers are "NO", then may proceed with Cephalosporin use.   [name] Phenazopyridine Hcl Hives          Metabolic Disorder Labs: Lab Results  Component Value Date   HGBA1C 6.4 (A) 06/24/2019   MPG 108 09/30/2018   MPG 134 10/15/2016   No results found for: PROLACTIN Lab Results  Component Value Date   CHOL 187 02/12/2019   TRIG 202 (H) 02/12/2019   HDL 46 02/12/2019   CHOLHDL 3.5 09/30/2018   VLDL 26 07/19/2015   LDLCALC 106 (H) 02/12/2019   LDLCALC 79 09/30/2018   Lab Results  Component Value Date   TSH 0.94 02/17/2019   TSH 0.82 09/30/2018    Therapeutic Level Labs: Lab Results  Component Value Date   LITHIUM <0.06 (L) 10/15/2016   Lab Results  Component Value Date   VALPROATE <10.0 (L) 08/23/2007   No components found for:  CBMZ  Current Medications: Current Outpatient Medications  Medication Sig Dispense Refill  . albuterol (VENTOLIN HFA) 108 (90 Base) MCG/ACT inhaler INHALE 1 TO 2 PUFFS EVERY 6 HOURS AS NEEDED FOR WHEEZING, SHORTNESS OF BREATH 18 g 3  . Alcohol Swabs (B-D SINGLE USE SWABS REGULAR) PADS USE TO CLEAN FINGER PRIOR TO STICKING FOR BLOOD SUGAR. 100 each 11  . alendronate (FOSAMAX) 70 MG tablet Take 1 tablet (70 mg total) by mouth every 7 (seven) days. Take with a full glass of water on an empty stomach. 12 tablet 3  . amLODipine (NORVASC) 10 MG tablet TAKE 1 TABLET EVERY DAY 90 tablet 3  . ascorbic acid (VITAMIN C) 500 MG tablet Take 500 mg by mouth daily.    [name] aspirin EC 81 MG tablet Take 1 tablet (81 mg total) by mouth daily with breakfast. 120 tablet 2  . Blood Glucose Calibration (ACCU-CHEK GUIDE CONTROL) LIQD USE PRN TO CALIBRATE GLUCOMETER 3 each 11  . blood glucose meter kit and supplies Dispense based on patient and insurance preference. Use up to  four times daily as directed. (FOR ICD-10 E10.9, E11.9). 1 each 0  . buPROPion (WELLBUTRIN XL) 150 MG 24 hr tablet Take 1 tablet (150 mg total) by mouth every morning. 90 tablet 2  . cetirizine (ZYRTEC) 10 MG tablet Take 1 tablet (10 mg total) by mouth daily. 7 tablet 0  . chlorhexidine (PERIDEX) 0.12 % solution Use as directed 15 mLs in the mouth or throat 2 (two) times daily. 473 mL 0  . clobetasol cream (TEMOVATE) 3.14 % Apply 1 application topically 2 (two) times daily.     . Continuous Blood Gluc Sensor (FREESTYLE LIBRE 14 DAY SENSOR) MISC 1 each by Does not apply route every 14 (fourteen) days. Change every 2 weeks 2 each 11  . cromolyn (NASALCROM) 5.2 MG/ACT nasal spray Place 1 spray into both nostrils 4 (four) times daily. 13 mL 1  . diclofenac sodium (VOLTAREN) 1 % GEL APPLY 2 GRAMS  TOPICALLY 4 (FOUR) TIMES DAILY. 200 g 1  . diclofenac Sodium (VOLTAREN) 1 % GEL APPLY 2 GRAMS  TOPICALLY 4 (FOUR) TIMES DAILY. 800 g 0  . dicyclomine (BENTYL) 10 MG capsule Take 1 capsule (10 mg total) by mouth  3 (three) times daily as needed for spasms. 90 capsule 2  . ezetimibe (ZETIA) 10 MG tablet Take 1 tablet (10 mg total) by mouth daily. 90 tablet 1  . glucose blood (ACCU-CHEK GUIDE) test strip USE TO CHECK BLOOD SUGAR FOUR TIMES A DAY AND PRN 400 each 11  . hydroxychloroquine (PLAQUENIL) 200 MG tablet Take 200 mg by mouth 2 (two) times daily.    . hydrOXYzine (ATARAX/VISTARIL) 25 MG tablet Take 1 tablet (25 mg total) by mouth daily as needed for itching. 15 tablet 0  . insulin aspart (NOVOLOG FLEXPEN) 100 UNIT/ML FlexPen INJECT 10 TO 14 UNITS INTO THE SKIN 3 (THREE) TIMES DAILY WITH MEALS. 45 mL 3  . Insulin Glargine, 2 Unit Dial, (TOUJEO MAX SOLOSTAR) 300 UNIT/ML SOPN Inject 25 Units into the skin at bedtime. 9 pen 3  . lamoTRIgine (LAMICTAL) 100 MG tablet Take 1 tablet (100 mg total) by mouth 2 (two) times daily. 180 tablet 2  . Lancets (ACCU-CHEK SOFT TOUCH) lancets Use as instructed to check blood  sugar 3 times a day. 100 each 12  . levothyroxine (SYNTHROID) 50 MCG tablet TAKE 1 TABLET DAILY, EXCEPT TAKE 1/2 TABLET ON SUNDAY 85 tablet 1  . linaclotide (LINZESS) 72 MCG capsule Take 1 capsule (72 mcg total) by mouth daily before breakfast. Pharmacy-please d/c linzess 145 mcg dosage 30 capsule 2  . LORazepam (ATIVAN) 0.5 MG tablet Take 1 tablet (0.5 mg total) by mouth 3 (three) times daily. 270 tablet 2  . losartan (COZAAR) 50 MG tablet TAKE 1 TABLET EVERY DAY 90 tablet 1  . lubiprostone (AMITIZA) 8 MCG capsule Take 1 capsule (8 mcg total) by mouth 2 (two) times daily with a meal. Pharmacy- d/c script for amitiza 24 mcg 180 capsule 0  . metaxalone (SKELAXIN) 800 MG tablet Take 800 mg by mouth 3 (three) times daily.    . metoprolol tartrate (LOPRESSOR) 50 MG tablet TAKE 1 TABLET TWICE DAILY (NEED MD APPOINTMENT) 180 tablet 3  . montelukast (SINGULAIR) 10 MG tablet TAKE 1 TABLET EVERY DAY 90 tablet 1  . NEOMYCIN-POLYMYXIN-HC, OPHTH, SUSP neomycin 3.5 mg-polymyxin 10,000 unit-hydrocort 10 mg/mL eye drop,susp    . nystatin (MYCOSTATIN/NYSTOP) powder APPLY TO AFFECTED AREA 4 TIMES DAILY. 90 g 1  . Omega-3 Fatty Acids (FISH OIL PO) Take 1 capsule by mouth daily.    . ondansetron (ZOFRAN) 4 MG tablet Take 1 tablet (4 mg total) by mouth every 8 (eight) hours as needed for nausea or vomiting. 20 tablet 0  . oxyCODONE-acetaminophen (PERCOCET) 10-325 MG tablet Take 1 tablet by mouth every 8 (eight) hours as needed.     . pregabalin (LYRICA) 75 MG capsule Take 1 capsule (75 mg total) by mouth daily.    . RABEprazole (ACIPHEX) 20 MG tablet TAKE 1 TABLET TWICE DAILY 180 tablet 3  . RESTASIS 0.05 % ophthalmic emulsion Place 1 drop into both eyes 2 (two) times daily.     . risperiDONE (RISPERDAL) 0.5 MG tablet Take 1 tablet (0.5 mg total) by mouth at bedtime. 90 tablet 2  . rosuvastatin (CRESTOR) 5 MG tablet TAKE 1 TABLET AT BEDTIME 90 tablet 2  . sertraline (ZOLOFT) 100 MG tablet Take 1 tablet (100 mg total)  by mouth daily. 90 tablet 2  . SHINGRIX injection     . STIOLTO RESPIMAT 2.5-2.5 MCG/ACT AERS INHALE 2 PUFFS INTO THE LUNGS DAILY. 12 g 3  . traZODone (DESYREL) 100 MG tablet Take 1 tablet (100 mg total) by mouth at bedtime.  90 tablet 2   No current facility-administered medications for this visit.     Musculoskeletal: Strength & Muscle Tone: within normal limits Gait & Station: normal Patient leans: N/A  Psychiatric Specialty Exam: Review of Systems  HENT: Positive for mouth sores.   Musculoskeletal: Positive for arthralgias, back pain and myalgias.  All other systems reviewed and are negative.   There were no vitals taken for this visit.There is no height or weight on file to calculate BMI.  General Appearance: NA  Eye Contact:  NA  Speech:  Clear and Coherent  Volume:  Normal  Mood:  Euthymic  Affect:  NA  Thought Process:  Goal Directed  Orientation:  Full (Time, Place, and Person)  Thought Content: Rumination   Suicidal Thoughts:  No  Homicidal Thoughts:  No  Memory:  Immediate;   Good Recent;   Good Remote;   Fair  Judgement:  Fair  Insight:  Fair  Psychomotor Activity:  Decreased  Concentration:  Concentration: Fair and Attention Span: Fair  Recall:  [employer] of Knowledge: Fair  Language: Good  Akathisia:  No  Handed:  Right  AIMS (if indicated): not done  Assets:  Communication Skills Desire for Improvement Resilience Social Support Talents/Skills  ADL's:  Intact  Cognition: WNL  Sleep:  Good   Screenings: Mini-Mental     Office Visit from 02/03/2019 in [facility] Office Visit from 09/08/2015 in [facility] [place] from 07/02/2014 in [facility] from 12/08/2013 in [facility]  Total Score (max 30 points )  '27  24  26  27    '$ PHQ2-9     Video Visit from 07/07/2019 in [facility] Patient Outreach Telephone from 05/21/2019 in [place] Patient  Outreach Telephone from 05/13/2019 in [place] Video Visit from 04/21/2019 in [facility] Patient Outreach Telephone from 01/15/2019 in [place]  PHQ-2 Total Score  '2  2  2  2  2  '$ PHQ-9 Total Score  '13  5  8  3  9       '$ Assessment and Plan: This patient is a 69 year old female with a history of depression anxiety.  She seems to be fairly stable.  She will continue Wellbutrin XL 150 mg daily for depression and smoking cessation as well as Zoloft 100 mg daily for depression.  She will continue trazodone 100 mg at bedtime for sleep, Ativan 0.5 mg 3 times daily for anxiety and Risperdal 0.5 mg at bedtime for mood stabilization.  She will also continue Lamictal 100 mg twice daily for mood stabilization.  She will return to see me in 3 months   [name] Spiller, MD 08/19/2019, 2:16 PM

## 2019-08-20 ENCOUNTER — Other Ambulatory Visit: Payer: Self-pay

## 2019-08-20 ENCOUNTER — Encounter: Payer: Self-pay | Admitting: Family Medicine

## 2019-08-20 ENCOUNTER — Ambulatory Visit (INDEPENDENT_AMBULATORY_CARE_PROVIDER_SITE_OTHER): Payer: Medicare HMO | Admitting: Family Medicine

## 2019-08-20 VITALS — BP 137/69 | HR 65 | Temp 98.6°F | Resp 15 | Ht 59.0 in | Wt 165.0 lb

## 2019-08-20 DIAGNOSIS — R296 Repeated falls: Secondary | ICD-10-CM | POA: Diagnosis not present

## 2019-08-20 DIAGNOSIS — E559 Vitamin D deficiency, unspecified: Secondary | ICD-10-CM

## 2019-08-20 DIAGNOSIS — E785 Hyperlipidemia, unspecified: Secondary | ICD-10-CM | POA: Diagnosis not present

## 2019-08-20 DIAGNOSIS — E1143 Type 2 diabetes mellitus with diabetic autonomic (poly)neuropathy: Secondary | ICD-10-CM

## 2019-08-20 DIAGNOSIS — I10 Essential (primary) hypertension: Secondary | ICD-10-CM | POA: Diagnosis not present

## 2019-08-20 DIAGNOSIS — M4716 Other spondylosis with myelopathy, lumbar region: Secondary | ICD-10-CM | POA: Diagnosis not present

## 2019-08-20 DIAGNOSIS — G8929 Other chronic pain: Secondary | ICD-10-CM

## 2019-08-20 DIAGNOSIS — F3170 Bipolar disorder, currently in remission, most recent episode unspecified: Secondary | ICD-10-CM | POA: Diagnosis not present

## 2019-08-20 DIAGNOSIS — M5442 Lumbago with sciatica, left side: Secondary | ICD-10-CM

## 2019-08-20 DIAGNOSIS — Z720 Tobacco use: Secondary | ICD-10-CM

## 2019-08-20 NOTE — Progress Notes (Addendum)
[name]     MRN: [id number]      DOB: 1950/08/29   HPI [name] is here for follow up and re-evaluation of chronic medical conditions, medication management and review of any available recent lab and radiology data.  Preventive health is updated, specifically  Cancer screening and Immunization.   Questions or concerns regarding consultations or procedures which the PT has had in the interim are  addressed. The PT denies any adverse reactions to current medications since the last visit.  There are no new concerns.  Denies polyuria, polydipsia, blurred vision , or hypoglycemic episodes.  ROS Denies recent fever or chills. Denies sinus pressure, nasal congestion, ear pain or sore throat. Denies chest congestion, productive cough or wheezing. Denies chest pains, palpitations and leg swelling Denies abdominal pain, nausea, vomiting,diarrhea or constipation.   Denies dysuria, frequency, hesitancy or incontinence. c/o chronic  joint pain, swelling and limitation in mobility. Denies headaches, seizures, numbness, or tingling. Denies uncontrolled depression, anxiety or insomnia. Denies skin break down or rash.   PE  BP 137/69   Pulse 65   Temp 98.6 F (37 C) (Temporal)   Resp 15   Ht 4\' 11"  (1.499 m)   Wt 165 lb 0.6 oz (74.9 kg)   SpO2 96%   BMI 33.33 kg/m   Patient alert and oriented and in no cardiopulmonary distress.  HEENT: No facial asymmetry, EOMI,     Neck supple .  Chest: Clear to auscultation bilaterally.  CVS: S1, S2 no murmurs, no S3.Regular rate.  ABD: Soft non tender.   Ext: No edema  MS: decreased  ROM spine, shoulders, hips and knees.  Skin: Intact, no ulcerations or rash noted.  Psych: Good eye contact, normal affect. Memory intact not anxious or depressed appearing.  CNS: CN 2-12 intact, power,  normal throughout.no focal deficits noted.   Assessment & Plan  Essential hypertension Controlled, no change in medication DASH diet and commitment  to daily physical activity for a minimum of 30 minutes discussed and encouraged, as a part of hypertension management. The importance of attaining a healthy weight is also discussed.  BP/Weight 08/20/2019 08/13/2019 07/16/2019 07/07/2019 07/04/2019 07/02/2019 08/08/4130  Systolic BP 440 102 725 366 440 347 425  Diastolic BP 69 72 77 57 57 60 42  Wt. (Lbs) 165.04 163.13 - 162 - 162.25 -  BMI 33.33 32.95 - 32.72 - 32.77 -  Some encounter information is confidential and restricted. Go to Review Flowsheets activity to see all data.       Well controlled type 2 diabetes mellitus with gastroparesis ([place]) Managed by endo [name] is reminded of the importance of commitment to daily physical activity for 30 minutes or more, as able and the need to limit carbohydrate intake to 30 to 60 grams per meal to help with blood sugar control.   The need to take medication as prescribed, test blood sugar as directed, and to call between visits if there is a concern that blood sugar is uncontrolled is also discussed.   [name] is reminded of the importance of daily foot exam, annual eye examination, and good blood sugar, blood pressure and cholesterol control.  Diabetic Labs Latest Ref Rng & Units 08/21/2019 06/24/2019 06/12/2019 03/25/2019 02/12/2019  HbA1c 4.0 - 5.6 % - 6.4(A) - - 5.9(H)  Microalbumin Not Estab. ug/mL 25.3(H) - - - -  Micro/Creat Ratio 0 - 29 mg/g creat 24 - - - -  Chol 100 - 199 mg/dL - - - -  187  HDL >39 mg/dL - - - - 46  Calc LDL 0 - 99 mg/dL - - - - 106(H)  Triglycerides 0 - 149 mg/dL - - - - 202(H)  Creatinine 0.44 - 1.00 mg/dL - - 0.87 1.03(H) 1.00  GFR >60.00 mL/min - - - - -   BP/Weight 08/20/2019 08/13/2019 07/16/2019 07/07/2019 07/04/2019 07/02/2019 1/61/0960  Systolic BP 454 098 119 147 829 562 130  Diastolic BP 69 72 77 57 57 60 42  Wt. (Lbs) 165.04 163.13 - 162 - 162.25 -  BMI 33.33 32.95 - 32.72 - 32.77 -  Some encounter information is confidential and restricted. Go to Review Flowsheets  activity to see all data.   Foot/eye exam completion dates Latest Ref Rng & Units 01/01/2019 04/30/2018  Eye Exam No Retinopathy - Retinopathy(A)  Foot exam Order - - -  Foot Form Completion - Done -        Left-sided low back pain with left-sided sciatica Followed by pain mangement  Hyperlipidemia Hyperlipidemia:Low fat diet discussed and encouraged.   Lipid Panel  Lab Results  Component Value Date   CHOL 187 02/12/2019   HDL 46 02/12/2019   LDLCALC 106 (H) 02/12/2019   TRIG 202 (H) 02/12/2019   CHOLHDL 3.5 09/30/2018   Uncontrolled , needs to reduce fat intake    Bipolar disorder (HCC) Stable, managed by Psych  Tobacco user Asked:confirms currently smokes cigarettes Assess: willing to quit but cutting back Advise: needs to QUIT to reduce risk of cancer, cardio and cerebrovascular disease Assist: counseled for 5 minutes and literature provided Arrange: follow up in 3 months   Recurrent falls Needs assistive device for safe mobility both inside and outside of her home Requests lightweight wheelchair which she can manipulate independently. Same will be ordered  Lumbar spondylosis with myelopathy Lower extremity weakness and chronic back pain make safe ambulation a challenge. Request for light weiught wheelchair for use inside and outside of her home will improve safety and quality of life also independence

## 2019-08-20 NOTE — Patient Instructions (Addendum)
Wellness due please schedule  Nurse Please send for diabetic eye exam done in 2021 in [facility] from office today  Congrats on being down to 4 ciggs / day, plan to quit  By August   I will set up follow up with Cardiology and neurosurgery as in indicated  Please discuss with orthopedic dr Veverly Fells.  possible therapy for your left shoulder since excess pain and poor function  Fasting lipid,  and vit d in next 1 to 2 weeks  Thanks for choosing [facility], we consider it a privelige to serve you.

## 2019-08-21 ENCOUNTER — Other Ambulatory Visit (HOSPITAL_COMMUNITY)
Admission: RE | Admit: 2019-08-21 | Discharge: 2019-08-21 | Disposition: A | Discharge status: Home / Self Care | Payer: Medicare HMO | Source: Other Acute Inpatient Hospital | Attending: Family Medicine | Admitting: Family Medicine

## 2019-08-21 DIAGNOSIS — E1143 Type 2 diabetes mellitus with diabetic autonomic (poly)neuropathy: Secondary | ICD-10-CM | POA: Insufficient documentation

## 2019-08-21 DIAGNOSIS — K3184 Gastroparesis: Secondary | ICD-10-CM | POA: Insufficient documentation

## 2019-08-22 LAB — MICROALBUMIN / CREATININE URINE RATIO
Creatinine, Urine: 107 mg/dL
Microalb Creat Ratio: 24 mg/g creat (ref 0–29)
Microalb, Ur: 25.3 ug/mL — ABNORMAL HIGH

## 2019-08-26 ENCOUNTER — Ambulatory Visit: Payer: Medicare HMO | Admitting: Family Medicine

## 2019-08-27 ENCOUNTER — Telehealth: Payer: Self-pay

## 2019-08-27 ENCOUNTER — Other Ambulatory Visit: Payer: Self-pay

## 2019-08-27 DIAGNOSIS — M160 Bilateral primary osteoarthritis of hip: Secondary | ICD-10-CM

## 2019-08-27 NOTE — Telephone Encounter (Signed)
Rx for cane sent to [facility]

## 2019-08-27 NOTE — Telephone Encounter (Signed)
Pt needs a new Cane.

## 2019-08-31 NOTE — Assessment & Plan Note (Signed)
Managed by endo Ms. Bokhari is reminded of the importance of commitment to daily physical activity for 30 minutes or more, as able and the need to limit carbohydrate intake to 30 to 60 grams per meal to help with blood sugar control.   The need to take medication as prescribed, test blood sugar as directed, and to call between visits if there is a concern that blood sugar is uncontrolled is also discussed.   Ms. Faddis is reminded of the importance of daily foot exam, annual eye examination, and good blood sugar, blood pressure and cholesterol control.  Diabetic Labs Latest Ref Rng & Units 08/21/2019 06/24/2019 06/12/2019 03/25/2019 02/12/2019  HbA1c 4.0 - 5.6 % - 6.4(A) - - 5.9(H)  Microalbumin Not Estab. ug/mL 25.3(H) - - - -  Micro/Creat Ratio 0 - 29 mg/g creat 24 - - - -  Chol 100 - 199 mg/dL - - - - 187  HDL >39 mg/dL - - - - 46  Calc LDL 0 - 99 mg/dL - - - - 106(H)  Triglycerides 0 - 149 mg/dL - - - - 202(H)  Creatinine 0.44 - 1.00 mg/dL - - 0.87 1.03(H) 1.00  GFR >60.00 mL/min - - - - -   BP/Weight 08/20/2019 08/13/2019 07/16/2019 07/07/2019 07/04/2019 07/02/2019 7/41/6384  Systolic BP 536 468 032 122 482 500 370  Diastolic BP 69 72 77 57 57 60 42  Wt. (Lbs) 165.04 163.13 - 162 - 162.25 -  BMI 33.33 32.95 - 32.72 - 32.77 -  Some encounter information is confidential and restricted. Go to Review Flowsheets activity to see all data.   Foot/eye exam completion dates Latest Ref Rng & Units 01/01/2019 04/30/2018  Eye Exam No Retinopathy - Retinopathy(A)  Foot exam Order - - -  Foot Form Completion - Done -

## 2019-08-31 NOTE — Assessment & Plan Note (Signed)
Stable, managed by Psych

## 2019-08-31 NOTE — Assessment & Plan Note (Signed)
Controlled, no change in medication DASH diet and commitment to daily physical activity for a minimum of 30 minutes discussed and encouraged, as a part of hypertension management. The importance of attaining a healthy weight is also discussed.  BP/Weight 08/20/2019 08/13/2019 07/16/2019 07/07/2019 07/04/2019 07/02/2019 11/22/2581  Systolic BP 462 194 712 527 129 290 903  Diastolic BP 69 72 77 57 57 60 42  Wt. (Lbs) 165.04 163.13 - 162 - 162.25 -  BMI 33.33 32.95 - 32.72 - 32.77 -  Some encounter information is confidential and restricted. Go to Review Flowsheets activity to see all data.

## 2019-08-31 NOTE — Assessment & Plan Note (Signed)
Asked:confirms currently smokes cigarettes Assess:willing to quit but cutting back Advise: needs to QUIT to reduce risk of cancer, cardio and cerebrovascular disease Assist: counseled for 5 minutes and literature provided Arrange: follow up in 3 months  

## 2019-08-31 NOTE — Assessment & Plan Note (Signed)
Followed by pain mangement

## 2019-08-31 NOTE — Assessment & Plan Note (Signed)
Hyperlipidemia:Low fat diet discussed and encouraged.   Lipid Panel  Lab Results  Component Value Date   CHOL 187 02/12/2019   HDL 46 02/12/2019   LDLCALC 106 (H) 02/12/2019   TRIG 202 (H) 02/12/2019   CHOLHDL 3.5 09/30/2018   Uncontrolled , needs to reduce fat intake

## 2019-09-01 DIAGNOSIS — E039 Hypothyroidism, unspecified: Secondary | ICD-10-CM | POA: Diagnosis not present

## 2019-09-01 DIAGNOSIS — J42 Unspecified chronic bronchitis: Secondary | ICD-10-CM | POA: Diagnosis not present

## 2019-09-01 DIAGNOSIS — G894 Chronic pain syndrome: Secondary | ICD-10-CM | POA: Diagnosis not present

## 2019-09-01 DIAGNOSIS — E1165 Type 2 diabetes mellitus with hyperglycemia: Secondary | ICD-10-CM | POA: Diagnosis not present

## 2019-09-01 DIAGNOSIS — F250 Schizoaffective disorder, bipolar type: Secondary | ICD-10-CM | POA: Diagnosis not present

## 2019-09-01 DIAGNOSIS — M160 Bilateral primary osteoarthritis of hip: Secondary | ICD-10-CM | POA: Diagnosis not present

## 2019-09-01 DIAGNOSIS — J449 Chronic obstructive pulmonary disease, unspecified: Secondary | ICD-10-CM | POA: Diagnosis not present

## 2019-09-01 DIAGNOSIS — F17210 Nicotine dependence, cigarettes, uncomplicated: Secondary | ICD-10-CM | POA: Diagnosis not present

## 2019-09-01 DIAGNOSIS — M542 Cervicalgia: Secondary | ICD-10-CM | POA: Diagnosis not present

## 2019-09-01 DIAGNOSIS — D649 Anemia, unspecified: Secondary | ICD-10-CM | POA: Diagnosis not present

## 2019-09-01 DIAGNOSIS — E1142 Type 2 diabetes mellitus with diabetic polyneuropathy: Secondary | ICD-10-CM | POA: Diagnosis not present

## 2019-09-01 DIAGNOSIS — Z79899 Other long term (current) drug therapy: Secondary | ICD-10-CM | POA: Diagnosis not present

## 2019-09-01 DIAGNOSIS — M5030 Other cervical disc degeneration, unspecified cervical region: Secondary | ICD-10-CM | POA: Diagnosis not present

## 2019-09-01 DIAGNOSIS — Z79891 Long term (current) use of opiate analgesic: Secondary | ICD-10-CM | POA: Diagnosis not present

## 2019-09-01 DIAGNOSIS — M25512 Pain in left shoulder: Secondary | ICD-10-CM | POA: Diagnosis not present

## 2019-09-03 DIAGNOSIS — M160 Bilateral primary osteoarthritis of hip: Secondary | ICD-10-CM | POA: Diagnosis not present

## 2019-09-08 ENCOUNTER — Other Ambulatory Visit: Payer: Self-pay

## 2019-09-08 ENCOUNTER — Ambulatory Visit (INDEPENDENT_AMBULATORY_CARE_PROVIDER_SITE_OTHER): Payer: Medicare HMO | Admitting: Psychiatry

## 2019-09-08 DIAGNOSIS — F331 Major depressive disorder, recurrent, moderate: Secondary | ICD-10-CM | POA: Diagnosis not present

## 2019-09-08 NOTE — Progress Notes (Signed)
Virtual Visit via Telephone Note  I connected with Jeliyah Middlebrooks Kincer on 09/08/19 at  4:00 PM EDT by telephone and verified that I am speaking with the correct person using two identifiers.   I discussed the limitations, risks, security and privacy concerns of performing an evaluation and management service by telephone and the availability of in person appointments. I also discussed with the patient that there may be a patient responsible charge related to this service. The patient expressed understanding and agreed to proceed.   I provided 50  minutes of non-face-to-face time during this encounter.   [name], LCSW                    THERAPIST PROGRESS NOTE  Location: [facility]-home [name] [place] office  Session Time: Monday 6/28 /2021 4:00 PM - 4:50 PM   Participation Level: Active  Behavioral Response:  depressed, anxious  Type of Therapy: Individual Therapy  Treatment Goals addressed: Learn and implement behavioral strategies to overcome depression.                                                       Verbalize an understanding and resolution of current interpersonal problems.   Interventions: CBT and Supportive   Summary: Krystyl Cannell Dooling is a 69 y.o. female who presents with  long standing history of recurrent periods  of depression beginning when she was 47 and her favorite uncle died. Patient reports multiple psychiatric hospitlaizations due to depression and suicidal ideations with the last one occuring in 1997. Patient has participated in outpatient psychotherapy and medication management intermittently since age 22. She currently is seeing psychiatrist Dr. Harrington Challenger . Prior to this, she was seen at [facility]. Patient also has had ECT at [facility]. Symptoms have worsened in recent months due to family stress and have  included depressed mood, anxiety, excessive worry, and tearfulness.         Patient's last contact was by virtual visit via telephone  about  3- 4 weeks ago. She is experiencing moderate symptoms of depression including depressed mood, tearfulness, irritability, isolated behaviors, social withdrawal, and poor motivation.  Increased symptoms appeared to be triggered by recent conflict with her son as well as lack of contact from her grandson for the past several weeks.  Patient also reports stress regarding interaction with some of her fellow church members and additional stress related to the death of a family friend.  Patient reports feeling rejected as well as grief and loss issues.  She reports she has continued to try to attend church as well as do Bible study on the phone.  She reports increased stress has caused her to resume increased cigarette use.   Suicidal/Homicidal: Nowithout intent/plan  Therapist Response: Reviewed symptoms, discussed stressors, facilitated expression of thoughts and feelings, validated feelings, normalized feelings related to grief and loss, assisted patient identify/challenge/and replace negative thoughts regarding interaction with church members, assisted patient identify others in her support group and ways to nurture those relationships, assisted patient develop coping statements using her spirituality and developed plan with her to read statements regularly,   Plan: Return again in 2 weeks.  Diagnosis: Axis I: MDD, Recurrent, moderate    Axis II: Deferred    Joshus Rogan E Sabrie Moritz, LCSW

## 2019-09-11 ENCOUNTER — Other Ambulatory Visit: Payer: Self-pay

## 2019-09-11 DIAGNOSIS — J449 Chronic obstructive pulmonary disease, unspecified: Secondary | ICD-10-CM | POA: Diagnosis not present

## 2019-09-11 DIAGNOSIS — I10 Essential (primary) hypertension: Secondary | ICD-10-CM | POA: Diagnosis not present

## 2019-09-11 NOTE — Patient Outreach (Signed)
[facility]) Care Management  [place]  09/11/2019   [name] [birth year] [id number]  Subjective: Telephone call to patientfor disease management follow up.  Patient reports she is about the same.  She reports no further problems with her gum except for some aching at times.  She continues to have back and left shoulder pain. Patient being managed by pain management. Patient also sees counselor regularly for bipolar depression.  Patient states she has had some depressed times but frequently meets with her counselor to talk things out.  She reports her breathing is about the same. Discussed COPD and management. She verbalized understanding and voices no concerns.    Objective:   Encounter Medications:  Outpatient Encounter Medications as of 09/11/2019  Medication Sig  . albuterol (VENTOLIN HFA) 108 (90 Base) MCG/ACT inhaler INHALE 1 TO 2 PUFFS EVERY 6 HOURS AS NEEDED FOR WHEEZING, SHORTNESS OF BREATH  . Alcohol Swabs (B-D SINGLE USE SWABS REGULAR) PADS USE TO CLEAN FINGER PRIOR TO STICKING FOR BLOOD SUGAR.  [name] alendronate (FOSAMAX) 70 MG tablet Take 1 tablet (70 mg total) by mouth every 7 (seven) days. Take with a full glass of water on an empty stomach.  [name] amLODipine (NORVASC) 10 MG tablet TAKE 1 TABLET EVERY DAY  . ascorbic acid (VITAMIN C) 500 MG tablet Take 500 mg by mouth daily.  [name] aspirin EC 81 MG tablet Take 1 tablet (81 mg total) by mouth daily with breakfast.  . Blood Glucose Calibration (ACCU-CHEK GUIDE CONTROL) LIQD USE PRN TO CALIBRATE GLUCOMETER  . blood glucose meter kit and supplies Dispense based on patient and insurance preference. Use up to four times daily as directed. (FOR ICD-10 E10.9, E11.9).  [name] buPROPion (WELLBUTRIN XL) 150 MG 24 hr tablet Take 1 tablet (150 mg total) by mouth every morning.  . cetirizine (ZYRTEC) 10 MG tablet Take 1 tablet (10 mg total) by mouth daily.  . chlorhexidine (PERIDEX) 0.12 % solution Use as directed 15 mLs in the  mouth or throat 2 (two) times daily.  . clobetasol cream (TEMOVATE) 7.51 % Apply 1 application topically 2 (two) times daily.   . Continuous Blood Gluc Sensor (FREESTYLE LIBRE 14 DAY SENSOR) MISC 1 each by Does not apply route every 14 (fourteen) days. Change every 2 weeks  . cromolyn (NASALCROM) 5.2 MG/ACT nasal spray Place 1 spray into both nostrils 4 (four) times daily.  . diclofenac sodium (VOLTAREN) 1 % GEL APPLY 2 GRAMS  TOPICALLY 4 (FOUR) TIMES DAILY.  [name] diclofenac Sodium (VOLTAREN) 1 % GEL APPLY 2 GRAMS  TOPICALLY 4 (FOUR) TIMES DAILY.  [name] dicyclomine (BENTYL) 10 MG capsule Take 1 capsule (10 mg total) by mouth 3 (three) times daily as needed for spasms.  [name] ezetimibe (ZETIA) 10 MG tablet Take 1 tablet (10 mg total) by mouth daily.  [name] glucose blood (ACCU-CHEK GUIDE) test strip USE TO CHECK BLOOD SUGAR FOUR TIMES A DAY AND PRN  . hydroxychloroquine (PLAQUENIL) 200 MG tablet Take 200 mg by mouth 2 (two) times daily.  . hydrOXYzine (ATARAX/VISTARIL) 25 MG tablet Take 1 tablet (25 mg total) by mouth daily as needed for itching.  . insulin aspart (NOVOLOG FLEXPEN) 100 UNIT/ML FlexPen INJECT 10 TO 14 UNITS INTO THE SKIN 3 (THREE) TIMES DAILY WITH MEALS.  [name] Insulin Glargine, 2 Unit Dial, (TOUJEO MAX SOLOSTAR) 300 UNIT/ML SOPN Inject 25 Units into the skin at bedtime.  . lamoTRIgine (LAMICTAL) 100 MG tablet Take 1 tablet (100 mg total) by mouth  2 (two) times daily.  . Lancets (ACCU-CHEK SOFT TOUCH) lancets Use as instructed to check blood sugar 3 times a day.  . levothyroxine (SYNTHROID) 50 MCG tablet TAKE 1 TABLET DAILY, EXCEPT TAKE 1/2 TABLET ON SUNDAY  . linaclotide (LINZESS) 72 MCG capsule Take 1 capsule (72 mcg total) by mouth daily before breakfast. Pharmacy-please d/c linzess 145 mcg dosage  . LORazepam (ATIVAN) 0.5 MG tablet Take 1 tablet (0.5 mg total) by mouth 3 (three) times daily.  [name] losartan (COZAAR) 50 MG tablet TAKE 1 TABLET EVERY DAY  . lubiprostone (AMITIZA) 8 MCG capsule Take 1  capsule (8 mcg total) by mouth 2 (two) times daily with a meal. Pharmacy- d/c script for amitiza 24 mcg  . metaxalone (SKELAXIN) 800 MG tablet Take 800 mg by mouth 3 (three) times daily.  . metoprolol tartrate (LOPRESSOR) 50 MG tablet TAKE 1 TABLET TWICE DAILY (NEED MD APPOINTMENT)  . montelukast (SINGULAIR) 10 MG tablet TAKE 1 TABLET EVERY DAY  . NEOMYCIN-POLYMYXIN-HC, OPHTH, SUSP neomycin 3.5 mg-polymyxin 10,000 unit-hydrocort 10 mg/mL eye drop,susp  . nystatin (MYCOSTATIN/NYSTOP) powder APPLY TO AFFECTED AREA 4 TIMES DAILY.  [name] Omega-3 Fatty Acids (FISH OIL PO) Take 1 capsule by mouth daily.  . ondansetron (ZOFRAN) 4 MG tablet Take 1 tablet (4 mg total) by mouth every 8 (eight) hours as needed for nausea or vomiting.  [name] oxyCODONE-acetaminophen (PERCOCET) 10-325 MG tablet Take 1 tablet by mouth every 8 (eight) hours as needed.   . pregabalin (LYRICA) 75 MG capsule Take 1 capsule (75 mg total) by mouth daily.  . RABEprazole (ACIPHEX) 20 MG tablet TAKE 1 TABLET TWICE DAILY  . RESTASIS 0.05 % ophthalmic emulsion Place 1 drop into both eyes 2 (two) times daily.   . risperiDONE (RISPERDAL) 0.5 MG tablet Take 1 tablet (0.5 mg total) by mouth at bedtime.  . rosuvastatin (CRESTOR) 5 MG tablet TAKE 1 TABLET AT BEDTIME  . sertraline (ZOLOFT) 100 MG tablet Take 1 tablet (100 mg total) by mouth daily.  [name] SHINGRIX injection   . STIOLTO RESPIMAT 2.5-2.5 MCG/ACT AERS INHALE 2 PUFFS INTO THE LUNGS DAILY.  . traZODone (DESYREL) 100 MG tablet Take 1 tablet (100 mg total) by mouth at bedtime.   No facility-administered encounter medications on file as of 09/11/2019.    Functional Status:  In your present state of health, do you have any difficulty performing the following activities: 09/11/2019 07/07/2019  Hearing? N N  Comment - -  Vision? N N  Difficulty concentrating or making decisions? N Y  Walking or climbing stairs? Y Y  Comment some weakness -  Dressing or bathing? Tempie Donning  Comment Needs assistance from  aide -  Doing errands, shopping? Tempie Donning  Comment aide takes shopping -  Conservation officer, nature and eating ? Y N  Comment aide assists -  Using the Toilet? N N  Comment - -  In the past six months, have you accidently leaked urine? N N  Do you have problems with loss of bowel control? N N  Managing your Medications? N Y  Managing your Finances? N Y  Housekeeping or managing your Housekeeping? Y Y  Comment aide assists -  Some recent data might be hidden    Fall/Depression Screening: Fall Risk  09/11/2019 08/20/2019 07/18/2019  Falls in the past year? 1 1 1   Comment - - -  Number falls in past yr: 1 1 1   Comment - - -  Injury with Fall? 0 1 1  Comment - - -  Risk Factor Category  - - -  Risk for fall due to : History of fall(s) - History of fall(s)  Follow up Falls prevention discussed - Falls prevention discussed   PHQ 2/9 Scores 09/11/2019 08/20/2019 07/07/2019 05/21/2019 05/13/2019 04/21/2019 01/15/2019  PHQ - 2 Score 1 1 2 2 2 2 2   PHQ- 9 Score - - 13 5 8 3 9   Some encounter information is confidential and restricted. Go to Review Flowsheets activity to see all data.    Assessment:  Patient continues to manage chronic conditions and benefits from disease management follow ups.     Plan:  [facility] CM Care Plan Problem One     Most Recent Value  Care Plan Problem One Knowledge deficit related COPD  Role Documenting the Problem One Care Management Telephonic Coordinator  Care Plan for Problem One Active  [facility] Long Term Goal  Patient will not have any exacerbations of COPD within 90 days.    [facility] Long Term Goal Start Date 09/11/19  Interventions for Problem One Long Term Goal Green zone status. Patient reports wheezing at times of exertion.  Discussed COPD zones and when to notify physician.     RN CM will outreach patient again in the month of September and patient agreeable .   Jone Baseman, RN, MSN [place] [facility] Care Management Coordinator Direct Line [phone] Cell [phone] Toll  Free: [phone]  Fax: [phone]

## 2019-09-14 DIAGNOSIS — J42 Unspecified chronic bronchitis: Secondary | ICD-10-CM | POA: Diagnosis not present

## 2019-09-14 DIAGNOSIS — J449 Chronic obstructive pulmonary disease, unspecified: Secondary | ICD-10-CM | POA: Diagnosis not present

## 2019-09-14 DIAGNOSIS — M160 Bilateral primary osteoarthritis of hip: Secondary | ICD-10-CM | POA: Diagnosis not present

## 2019-09-14 DIAGNOSIS — E1165 Type 2 diabetes mellitus with hyperglycemia: Secondary | ICD-10-CM | POA: Diagnosis not present

## 2019-09-14 DIAGNOSIS — E1142 Type 2 diabetes mellitus with diabetic polyneuropathy: Secondary | ICD-10-CM | POA: Diagnosis not present

## 2019-09-14 DIAGNOSIS — F250 Schizoaffective disorder, bipolar type: Secondary | ICD-10-CM | POA: Diagnosis not present

## 2019-09-14 DIAGNOSIS — E039 Hypothyroidism, unspecified: Secondary | ICD-10-CM | POA: Diagnosis not present

## 2019-09-14 DIAGNOSIS — D649 Anemia, unspecified: Secondary | ICD-10-CM | POA: Diagnosis not present

## 2019-09-14 DIAGNOSIS — F17210 Nicotine dependence, cigarettes, uncomplicated: Secondary | ICD-10-CM | POA: Diagnosis not present

## 2019-09-15 DIAGNOSIS — I10 Essential (primary) hypertension: Secondary | ICD-10-CM | POA: Diagnosis not present

## 2019-09-15 DIAGNOSIS — M179 Osteoarthritis of knee, unspecified: Secondary | ICD-10-CM | POA: Diagnosis not present

## 2019-09-15 DIAGNOSIS — Z9181 History of falling: Secondary | ICD-10-CM | POA: Diagnosis not present

## 2019-09-18 ENCOUNTER — Ambulatory Visit (INDEPENDENT_AMBULATORY_CARE_PROVIDER_SITE_OTHER): Payer: Medicare HMO | Admitting: Internal Medicine

## 2019-09-18 ENCOUNTER — Other Ambulatory Visit: Payer: Self-pay

## 2019-09-18 ENCOUNTER — Encounter: Payer: Self-pay | Admitting: Internal Medicine

## 2019-09-18 VITALS — BP 128/58 | HR 67 | Temp 98.1°F | Ht 59.0 in | Wt 166.4 lb

## 2019-09-18 DIAGNOSIS — G4733 Obstructive sleep apnea (adult) (pediatric): Secondary | ICD-10-CM

## 2019-09-18 DIAGNOSIS — R911 Solitary pulmonary nodule: Secondary | ICD-10-CM | POA: Diagnosis not present

## 2019-09-18 DIAGNOSIS — R231 Pallor: Secondary | ICD-10-CM

## 2019-09-18 DIAGNOSIS — Z8639 Personal history of other endocrine, nutritional and metabolic disease: Secondary | ICD-10-CM | POA: Diagnosis not present

## 2019-09-18 DIAGNOSIS — Z720 Tobacco use: Secondary | ICD-10-CM | POA: Diagnosis not present

## 2019-09-18 LAB — GLUCOSE, POCT (MANUAL RESULT ENTRY): POC Glucose: 52 mg/dl — AB (ref 70–99)

## 2019-09-18 NOTE — Patient Instructions (Addendum)
Order- DME [name]- please refit CPAP mask of choice to reduce leak.                                 Change auto range to 5-12                                Please replace old machine if eligible, mask of choice, humidifier, supplies, AirView/ card  Order- referral for CPAP mask fitting at sleep disorders center    Dx OSA  Order- referral to Pharmacy Team here for smoking cessation help  Your blood sugar was too low today- you had skipped lunch and had skipped insulin. Please discuss with the doctor who manages your diabetes.   Please call us if we can help

## 2019-09-18 NOTE — Assessment & Plan Note (Signed)
Latest CT shows no concerning nodule, just some scarring/ atelectasis which is stable.

## 2019-09-18 NOTE — Progress Notes (Signed)
CBG when patient first arrived was taken due to patient being diaphoretic and weak. Patient alert and oriented x4.  CBG result of 52. Dr Annamaria Boots made aware. Ok to give two cookies and cups of sweet tea given to patient and patient ate and drank them. Good effect, patient no longer was diaphoretic and stated she felt better.  CBG rechecked prior to being discharged with result of 123. Dr Annamaria Boots aware. Patient able to ambulate with cane with steady gait. Ok to discharged per Dr Annamaria Boots. Patient expressed full understanding of discharge paperwork and stated she is going straight home and is going to eat.

## 2019-09-18 NOTE — Assessment & Plan Note (Signed)
Benefits when CPAP used as directed. Needs more hands-on help. Plan- lower auto range a little to 5-12, update order for replacement if eligible and arrange mask fitting/ desensitization.

## 2019-09-18 NOTE — Assessment & Plan Note (Signed)
She accepts offer to see Pharmacy Team here for help with smoking cessation.

## 2019-09-18 NOTE — Progress Notes (Signed)
HPI F Former smoker followed for obstructive sleep apnea with hypersomnia, , R/O'd Lung nodule,Wheezing dyspnea, complicated by hx resection of meningioma. Bipolar, GERD, DM, chronic epistaxis NPSG 3/8/13[name]- mild obstructive sleep apnea, AHI 14 per hour, mostly in REM. Epworth sleepiness score 14/24. Weight 164 pounds. She is living alone, divorced and disabled. Son has OSA. History of meningioma 11/23/2011. No seizure in over 3 years. Does not drive Office Spirometry 10/30/2013-submaximal effort based on appearance of loop and curve. Numbers would fit with severe restriction but her physiologic capability may be better than this. FVC 0.91/44%, FEV1 0.74/45%, FEV1/FVC 0.81, FEF 25-75% 1.43/69%. Office Spirometry 07/27/2015-weak effort but still better than her last previous trial effort. Mild restriction of exhaled volume. FVC 1.35/67%, FEV1 1.27/79%, FEV1/FVC 0.94, FEF 25-75 percent 2.14/107% --------------------------------------------------------------------------------------------------------    01/09/2019-  68 yoF smoker followed for obstructive sleep apnea with hypersomnia, , R/O'd Lung nodule,Wheezing dyspnea/ tobacco, complicated by hx resection of meningioma. Bipolar/ Depression, GERD, DM, chronic epistaxis  CPAP 5-15 auto/ [facility] ([employer]) Still smoking 5-10 cigs/ day Trazodone 100 mg hs, Stiolto 2.5, albuterol hfa -----OSA on CPAP auto 5-15, DME: [facility], pt reports issues with her current machine Body weight today 169 lbs Download compliance 13%, AHI 12.6/ hr    Many missed and short days Dislikes mask fit and complains humidifier runs dry- doesn't know how to adjust it.  Dyspnea on exertion, occasional cough- scant clear. Denies chest pain, blood, fever. Had flu vax. Discussed smoking cessation.   09/18/19- 69 yoF smoker followed for obstructive sleep apnea with hypersomnia, , R/O'd Lung nodule,Wheezing dyspnea/ tobacco, complicated by hx resection of meningioma. Bipolar/  Depression, GERD, DM, chronic epistaxis  CPAP 5-15 auto/ [facility] ([employer]) Still smoking 5-10 cigs/ day Download compliance 30%, AHI 11.3/ hr  Used 16 days, not used 14 days Pressure ranges 6.3-9.8 High leak Patient arrived diaphoretic and weak- had not had lunch- Fingerstick CBG 52. Patient was given a cookie and sweet tea. Repeated CBG in 15 minutes: 123. Stiolto, singulair, Ventolin hfa,  Body weight today 166 lbs Orthopedist told her too many medical problems for rotator cuff repair- pain tough to bear. She blames poor mask fit for poor CPAP compliance. Difficult coping. She says DME told her to go online for mask help- that's a big ask for her. Says breathing ok and meds ok. No recent cough or discolored sputum. CT from Nov reviewed. She would like to quit smoking but doesn't feel she can cope. Agrees to work with Pharmacy team. Hx XRT for meningioma- says now she has another one, being followed.  CT chest 01/16/2019- IMPRESSION: 1. No suspicious pulmonary nodule or mass on today's study. There is some focal chronic atelectasis or scarring in the lingula, not substantially changed since July of 2018. This likely accounts for the nodular opacity seen on the recent chest x-ray. 2.  Aortic Atherosclerois (ICD10-170.0)  ROS-see HPI + = positive Constitutional:   No-   weight loss, night sweats, fevers, chills, +fatigue, lassitude. HEENT:  + headaches, +difficulty swallowing, tooth/dental problems, sore throat,       No-sneezing, itching, ear ache, nasal congestion, post nasal drip,  CV:  chest pain, orthopnea, PND, swelling in lower extremities, anasarca, dizziness, +palpitations Resp: +  shortness of breath with exertion or at rest.                productive cough,   non-productive cough,  No- coughing up of blood.  No-   change in color of mucus.   wheezing.   Skin: No-   rash or lesions. GI:  +heartburn, +indigestion, no-abdominal pain, nausea, vomiting,  GU:  MS:  + joint  pain or swelling.   Neuro-     nothing unusual Psych:  No- change in mood or affect. +depression or +anxiety.  No memory loss.  OBJ- Physical Exam - + she speaks slowly but seems alert and appropriate,  General-  Oriented, Affect-flat, Distress- none acute Skin- rash-none, lesions- none, excoriation- none Lymphadenopathy- none Head- Eyes- Gross vision intact, PERRLA, conjunctivae and secretions clear            Ears- Hearing, canals-normal + = positive            Nose- no-Septal dev, polyps, erosion, perforation             Throat- Mallampati III , mucosa clear , drainage- none, tonsils- atrophic,  + dentures Neck- flexible , trachea midline, no stridor , thyroid nl, carotid no bruit Chest - symmetrical excursion , unlabored           Heart/CV- RRR , no murmur , no gallop  , no rub, nl s1 s2                           - JVD- none , edema- none, stasis changes- none, varices- none           Lung- clear to P&A, wheeze- none, cough- mild , dullness-none, rub- none           Chest wall-  Abd-  Br/ Gen/ Rectal- Not done, not indicated Extrem- cyanosis- none, clubbing, none, atrophy- none, strength- nl. +cane Neuro- + seems fully oriented but vague. Speech pattern is slow.

## 2019-09-22 ENCOUNTER — Telehealth: Payer: Self-pay | Admitting: Internal Medicine

## 2019-09-22 DIAGNOSIS — F17210 Nicotine dependence, cigarettes, uncomplicated: Secondary | ICD-10-CM | POA: Diagnosis not present

## 2019-09-22 DIAGNOSIS — M160 Bilateral primary osteoarthritis of hip: Secondary | ICD-10-CM | POA: Diagnosis not present

## 2019-09-22 DIAGNOSIS — E1165 Type 2 diabetes mellitus with hyperglycemia: Secondary | ICD-10-CM | POA: Diagnosis not present

## 2019-09-22 DIAGNOSIS — E1142 Type 2 diabetes mellitus with diabetic polyneuropathy: Secondary | ICD-10-CM | POA: Diagnosis not present

## 2019-09-22 DIAGNOSIS — J449 Chronic obstructive pulmonary disease, unspecified: Secondary | ICD-10-CM | POA: Diagnosis not present

## 2019-09-22 DIAGNOSIS — D649 Anemia, unspecified: Secondary | ICD-10-CM | POA: Diagnosis not present

## 2019-09-22 DIAGNOSIS — J42 Unspecified chronic bronchitis: Secondary | ICD-10-CM | POA: Diagnosis not present

## 2019-09-22 DIAGNOSIS — F250 Schizoaffective disorder, bipolar type: Secondary | ICD-10-CM | POA: Diagnosis not present

## 2019-09-22 DIAGNOSIS — E039 Hypothyroidism, unspecified: Secondary | ICD-10-CM | POA: Diagnosis not present

## 2019-09-23 DIAGNOSIS — E785 Hyperlipidemia, unspecified: Secondary | ICD-10-CM | POA: Diagnosis not present

## 2019-09-23 DIAGNOSIS — E559 Vitamin D deficiency, unspecified: Secondary | ICD-10-CM | POA: Diagnosis not present

## 2019-09-24 ENCOUNTER — Ambulatory Visit: Payer: Medicare HMO | Admitting: Dermatology

## 2019-09-24 LAB — VITAMIN D 25 HYDROXY (VIT D DEFICIENCY, FRACTURES): Vit D, 25-Hydroxy: 44 ng/mL (ref 30.0–100.0)

## 2019-09-24 LAB — LIPID PANEL WITH LDL/HDL RATIO
Cholesterol, Total: 125 mg/dL (ref 100–199)
HDL: 45 mg/dL (ref >39)
LDL Chol Calc (NIH): 59 mg/dL (ref 0–99)
LDL/HDL Ratio: 1.3 ratio (ref 0.0–3.2)
Triglycerides: 117 mg/dL (ref 0–149)
VLDL Cholesterol Cal: 21 mg/dL (ref 5–40)

## 2019-09-25 NOTE — Telephone Encounter (Signed)
Spoke with pt. States that she finally got in touch with [facility] and she has an appointment with them on 10/01/19 to get her supplies. Nothing further was needed.

## 2019-09-29 DIAGNOSIS — F17210 Nicotine dependence, cigarettes, uncomplicated: Secondary | ICD-10-CM | POA: Diagnosis not present

## 2019-09-29 DIAGNOSIS — E1165 Type 2 diabetes mellitus with hyperglycemia: Secondary | ICD-10-CM | POA: Diagnosis not present

## 2019-09-29 DIAGNOSIS — F250 Schizoaffective disorder, bipolar type: Secondary | ICD-10-CM | POA: Diagnosis not present

## 2019-09-29 DIAGNOSIS — E1142 Type 2 diabetes mellitus with diabetic polyneuropathy: Secondary | ICD-10-CM | POA: Diagnosis not present

## 2019-09-29 DIAGNOSIS — J42 Unspecified chronic bronchitis: Secondary | ICD-10-CM | POA: Diagnosis not present

## 2019-09-29 DIAGNOSIS — J449 Chronic obstructive pulmonary disease, unspecified: Secondary | ICD-10-CM | POA: Diagnosis not present

## 2019-09-29 DIAGNOSIS — M160 Bilateral primary osteoarthritis of hip: Secondary | ICD-10-CM | POA: Diagnosis not present

## 2019-09-29 DIAGNOSIS — E039 Hypothyroidism, unspecified: Secondary | ICD-10-CM | POA: Diagnosis not present

## 2019-09-29 DIAGNOSIS — D649 Anemia, unspecified: Secondary | ICD-10-CM | POA: Diagnosis not present

## 2019-10-01 ENCOUNTER — Other Ambulatory Visit: Payer: Self-pay | Admitting: *Deleted

## 2019-10-01 ENCOUNTER — Telehealth: Payer: Self-pay | Admitting: Acute Care

## 2019-10-01 ENCOUNTER — Ambulatory Visit (HOSPITAL_BASED_OUTPATIENT_CLINIC_OR_DEPARTMENT_OTHER): Payer: Medicare HMO | Attending: Internal Medicine | Admitting: Internal Medicine

## 2019-10-01 ENCOUNTER — Other Ambulatory Visit: Payer: Self-pay

## 2019-10-01 DIAGNOSIS — G4733 Obstructive sleep apnea (adult) (pediatric): Secondary | ICD-10-CM

## 2019-10-01 MED ORDER — RABEPRAZOLE SODIUM 20 MG PO TBEC: 20.0000 mg | DELAYED_RELEASE_TABLET | Freq: Two times a day (BID) | ORAL | 0 refills | Status: DC

## 2019-10-01 NOTE — Telephone Encounter (Signed)
Pt called the after hours  on call number. She stated that she went for her mask fitting today and that she was given a new mask. She has not worn her CPAP machine for at least 2  weeks as she has had mask issues. She stated her CPAP machine has been recalled, and she is unsure if she should use it or not.  She wants advice. [name]. I was unsure of your position on this.Do you want your patient's using the recalled devices, or would you rather they not use them?  I will defer to you as she has not worn her CPAP for 2 weeks, I didn't think one more night would make a difference.  Thanks so much.

## 2019-10-02 ENCOUNTER — Telehealth: Payer: Self-pay | Admitting: Family Medicine

## 2019-10-02 ENCOUNTER — Telehealth: Payer: Self-pay | Admitting: Internal Medicine

## 2019-10-02 NOTE — Telephone Encounter (Signed)
Patient called and advised of risk and benefits of using a machine that has been recalled. Patient has decided to continue to use the CPAP machine she has and does not feel it is safe to wait for months off of CPAP therapy.

## 2019-10-02 NOTE — Telephone Encounter (Signed)
Calling about labs

## 2019-10-04 NOTE — Telephone Encounter (Signed)
See comment, they are great!

## 2019-10-06 ENCOUNTER — Other Ambulatory Visit: Payer: Self-pay

## 2019-10-06 ENCOUNTER — Ambulatory Visit (INDEPENDENT_AMBULATORY_CARE_PROVIDER_SITE_OTHER): Payer: Medicare HMO | Admitting: Psychiatry

## 2019-10-06 DIAGNOSIS — J42 Unspecified chronic bronchitis: Secondary | ICD-10-CM | POA: Diagnosis not present

## 2019-10-06 DIAGNOSIS — D649 Anemia, unspecified: Secondary | ICD-10-CM | POA: Diagnosis not present

## 2019-10-06 DIAGNOSIS — F250 Schizoaffective disorder, bipolar type: Secondary | ICD-10-CM | POA: Diagnosis not present

## 2019-10-06 DIAGNOSIS — F331 Major depressive disorder, recurrent, moderate: Secondary | ICD-10-CM | POA: Diagnosis not present

## 2019-10-06 DIAGNOSIS — E1165 Type 2 diabetes mellitus with hyperglycemia: Secondary | ICD-10-CM | POA: Diagnosis not present

## 2019-10-06 DIAGNOSIS — J449 Chronic obstructive pulmonary disease, unspecified: Secondary | ICD-10-CM | POA: Diagnosis not present

## 2019-10-06 DIAGNOSIS — E1142 Type 2 diabetes mellitus with diabetic polyneuropathy: Secondary | ICD-10-CM | POA: Diagnosis not present

## 2019-10-06 DIAGNOSIS — M160 Bilateral primary osteoarthritis of hip: Secondary | ICD-10-CM | POA: Diagnosis not present

## 2019-10-06 DIAGNOSIS — F17210 Nicotine dependence, cigarettes, uncomplicated: Secondary | ICD-10-CM | POA: Diagnosis not present

## 2019-10-06 DIAGNOSIS — E039 Hypothyroidism, unspecified: Secondary | ICD-10-CM | POA: Diagnosis not present

## 2019-10-06 NOTE — Progress Notes (Signed)
Virtual Visit via Telephone Note  I connected with [name] on 10/06/19 at 1:11 PM EDT by telephone and verified that I am speaking with the correct person using two identifiers.   I discussed the limitations, risks, security and privacy concerns of performing an evaluation and management service by telephone and the availability of in person appointments. I also discussed with the patient that there may be a patient responsible charge related to this service. The patient expressed understanding and agreed to proceed.  I provided 42 minutes of non-face-to-face time during this encounter.   [name], LCSW                   THERAPIST PROGRESS NOTE  Location: Patient-home [name] - [place] office  Session Time: Monday 10/06/2019 1:11 PM -  1:53 PM          Participation Level: Active  Behavioral Response:  depressed, anxious  Type of Therapy: Individual Therapy  Treatment Goals addressed: Learn and implement behavioral strategies to overcome depression.                                                       Verbalize an understanding and resolution of current interpersonal problems.   Interventions: CBT and Supportive   Summary: [name] is a 69 y.o. female who presents with  long standing history of recurrent periods  of depression beginning when she was 81 and her favorite uncle died. Patient reports multiple psychiatric hospitlaizations due to depression and suicidal ideations with the last one occuring in 1997. Patient has participated in outpatient psychotherapy and medication management intermittently since age 57. She currently is seeing psychiatrist Dr. Harrington Challenger . Prior to this, she was seen at [facility]. Patient also has had ECT at [facility]. Symptoms have worsened in recent months due to family stress and have  included depressed mood, anxiety, excessive worry, and tearfulness.         Patient's last contact was by virtual visit via telephone  about  3- 4 weeks ago. She continues to experience  moderate symptoms of depression including depressed mood and irritability.  She is pleased about recent contact from her grandson and states he called her 3 times last week.  She still expresses frustration she does not hear from her son more often and still says family members do not care about her.  She is reports being depressed now mainly due to  increased chronic pain.  She reports thoughts of worthlessness and expresses frustration about having pain.  She reports not being able to do much of anything and says she has increased cigarette use.  She  continues to fear she may eventually have to go to a nursing home.  She denies any SI.  Patient reports additional stress as she says she has been receiving packages from [place] that she did not order.  She has taken steps to contact legal aid to address issues.  .   Suicidal/Homicidal: Nowithout intent/plan  Therapist Response: Reviewed symptoms, discussed stressors, facilitated expression of thoughts and feelings, validated feelings, provided psychoeducation regarding connection between depression and pain, discussed next steps for treatment to include focusing efforts on learning how to cope with chronic pain, began to examine patient's thought patterns about managing pain, will discuss more next session and review  treatment plan   Plan: Return again in 2 weeks.  Diagnosis: Axis I: MDD, Recurrent, moderate    Axis II: Deferred    Yamato Kopf E Orvie Caradine, LCSW

## 2019-10-06 NOTE — Telephone Encounter (Signed)
Patient aware of results with verbal understanding.  

## 2019-10-09 ENCOUNTER — Other Ambulatory Visit: Payer: Self-pay

## 2019-10-09 ENCOUNTER — Ambulatory Visit (INDEPENDENT_AMBULATORY_CARE_PROVIDER_SITE_OTHER): Payer: Medicare HMO | Admitting: Family Medicine

## 2019-10-09 ENCOUNTER — Encounter: Payer: Self-pay | Admitting: Family Medicine

## 2019-10-09 ENCOUNTER — Ambulatory Visit: Payer: Medicare HMO | Admitting: Family Medicine

## 2019-10-09 VITALS — BP 130/74 | HR 60 | Resp 16 | Wt 171.0 lb

## 2019-10-09 DIAGNOSIS — J0100 Acute maxillary sinusitis, unspecified: Secondary | ICD-10-CM | POA: Diagnosis not present

## 2019-10-09 DIAGNOSIS — M542 Cervicalgia: Secondary | ICD-10-CM | POA: Diagnosis not present

## 2019-10-09 DIAGNOSIS — R29898 Other symptoms and signs involving the musculoskeletal system: Secondary | ICD-10-CM

## 2019-10-09 DIAGNOSIS — I10 Essential (primary) hypertension: Secondary | ICD-10-CM

## 2019-10-09 DIAGNOSIS — E1143 Type 2 diabetes mellitus with diabetic autonomic (poly)neuropathy: Secondary | ICD-10-CM | POA: Diagnosis not present

## 2019-10-09 DIAGNOSIS — Z5181 Encounter for therapeutic drug level monitoring: Secondary | ICD-10-CM | POA: Diagnosis not present

## 2019-10-09 DIAGNOSIS — F17210 Nicotine dependence, cigarettes, uncomplicated: Secondary | ICD-10-CM | POA: Diagnosis not present

## 2019-10-09 DIAGNOSIS — E669 Obesity, unspecified: Secondary | ICD-10-CM

## 2019-10-09 DIAGNOSIS — Z79899 Other long term (current) drug therapy: Secondary | ICD-10-CM | POA: Diagnosis not present

## 2019-10-09 DIAGNOSIS — G894 Chronic pain syndrome: Secondary | ICD-10-CM | POA: Diagnosis not present

## 2019-10-09 DIAGNOSIS — F3170 Bipolar disorder, currently in remission, most recent episode unspecified: Secondary | ICD-10-CM

## 2019-10-09 DIAGNOSIS — Z79891 Long term (current) use of opiate analgesic: Secondary | ICD-10-CM | POA: Diagnosis not present

## 2019-10-09 DIAGNOSIS — E785 Hyperlipidemia, unspecified: Secondary | ICD-10-CM

## 2019-10-09 DIAGNOSIS — M5030 Other cervical disc degeneration, unspecified cervical region: Secondary | ICD-10-CM | POA: Diagnosis not present

## 2019-10-09 LAB — GLUCOSE, POCT (MANUAL RESULT ENTRY): POC Glucose: 85 mg/dl (ref 70–99)

## 2019-10-09 MED ORDER — NICOTINE 10 MG IN INHA
1.0000 | RESPIRATORY_TRACT | 3 refills | Status: DC | PRN
Start: 2019-10-09 — End: 2020-01-07

## 2019-10-09 MED ORDER — AZITHROMYCIN 250 MG PO TABS
ORAL_TABLET | ORAL | 0 refills | Status: DC
Start: 2019-10-09 — End: 2019-12-11

## 2019-10-09 MED ORDER — NYSTATIN 100000 UNIT/GM EX POWD: CUTANEOUS | 1 refills | Status: DC

## 2019-10-09 NOTE — Assessment & Plan Note (Addendum)
4 day history, z pack prescribed

## 2019-10-09 NOTE — Patient Instructions (Addendum)
F/U as before, call if you need me before   Azithromycin  Is prescribed for left facial pain  Good you are ready to quit , nicotrol inhaler is prescribed , no smoking cigarettes when you start the inhalers  Recent labs are excellent, no medication change, keep up the great work  Careful not to fall.  Blood sugar slightly low at 85, you will get peppermint candy in the office       Coping with Quitting Smoking  Quitting smoking is a physical and mental challenge. You will face cravings, withdrawal symptoms, and temptation. Before quitting, work with your health care provider to make a plan that can help you cope. Preparation can help you quit and keep you from giving in. How can I cope with cravings? Cravings usually last for 5-10 minutes. If you get through it, the craving will pass. Consider taking the following actions to help you cope with cravings:  Keep your mouth busy: ? Chew sugar-free gum. ? Suck on hard candies or a straw. ? Brush your teeth.  Keep your hands and body busy: ? Immediately change to a different activity when you feel a craving. ? Squeeze or play with a ball. ? Do an activity or a hobby, like making bead jewelry, practicing needlepoint, or working with wood. ? Mix up your normal routine. ? Take a short exercise break. Go for a quick walk or run up and down stairs. ? Spend time in public places where smoking is not allowed.  Focus on doing something kind or helpful for someone else.  Call a friend or family member to talk during a craving.  Join a support group.  Call a quit line, such as 1-800-QUIT-NOW.  Talk with your health care provider about medicines that might help you cope with cravings and make quitting easier for you. How can I deal with withdrawal symptoms? Your body may experience negative effects as it tries to get used to not having nicotine in the system. These effects are called withdrawal symptoms. They may include:  Feeling  hungrier than normal.  Trouble concentrating.  Irritability.  Trouble sleeping.  Feeling depressed.  Restlessness and agitation.  Craving a cigarette. To manage withdrawal symptoms:  Avoid places, people, and activities that trigger your cravings.  Remember why you want to quit.  Get plenty of sleep.  Avoid coffee and other caffeinated drinks. These may worsen some of your symptoms. How can I handle social situations? Social situations can be difficult when you are quitting smoking, especially in the first few weeks. To manage this, you can:  Avoid parties, bars, and other social situations where people might be smoking.  Avoid alcohol.  Leave right away if you have the urge to smoke.  Explain to your family and friends that you are quitting smoking. Ask for understanding and support.  Plan activities with friends or family where smoking is not an option. What are some ways I can cope with stress? Wanting to smoke may cause stress, and stress can make you want to smoke. Find ways to manage your stress. Relaxation techniques can help. For example:  Breathe slowly and deeply, in through your nose and out through your mouth.  Listen to soothing, relaxing music.  Talk with a family member or friend about your stress.  Light a candle.  Soak in a bath or take a shower.  Think about a peaceful place. What are some ways I can prevent weight gain? Be aware that many people gain  weight after they quit smoking. However, not everyone does. To keep from gaining weight, have a plan in place before you quit and stick to the plan after you quit. Your plan should include:  Having healthy snacks. When you have a craving, it may help to: ? Eat plain popcorn, crunchy carrots, celery, or other cut vegetables. ? Chew sugar-free gum.  Changing how you eat: ? Eat small portion sizes at meals. ? Eat 4-6 small meals throughout the day instead of 1-2 large meals a day. ? Be mindful  when you eat. Do not watch television or do other things that might distract you as you eat.  Exercising regularly: ? Make time to exercise each day. If you do not have time for a long workout, do short bouts of exercise for 5-10 minutes several times a day. ? Do some form of strengthening exercise, like weight lifting, and some form of aerobic exercise, like running or swimming.  Drinking plenty of water or other low-calorie or no-calorie drinks. Drink 6-8 glasses of water daily, or as much as instructed by your health care provider. Summary  Quitting smoking is a physical and mental challenge. You will face cravings, withdrawal symptoms, and temptation to smoke again. Preparation can help you as you go through these challenges.  You can cope with cravings by keeping your mouth busy (such as by chewing gum), keeping your body and hands busy, and making calls to family, friends, or a helpline for people who want to quit smoking.  You can cope with withdrawal symptoms by avoiding places where people smoke, avoiding drinks with caffeine, and getting plenty of rest.  Ask your health care provider about the different ways to prevent weight gain, avoid stress, and handle social situations. This information is not intended to replace advice given to you by your health care provider. Make sure you discuss any questions you have with your health care provider. Document Revised: 02/09/2017 Document Reviewed: 02/25/2016 Elsevier Patient Education  2020 [employer].

## 2019-10-09 NOTE — Progress Notes (Signed)
[name]     MRN: [id number]      DOB: [birth year]   HPI [name] is here for follow up and re-evaluation of chronic medical conditions, medication management and review of any available recent lab and radiology data.  Preventive health is updated, specifically  Cancer screening and Immunization.   Questions or concerns regarding consultations or procedures which the PT has had in the interim are  addressed. The PT denies any adverse reactions to current medications since the last visit.  Four day h/o facial pain intermittent across cheeks and in forehead, just hits her and she jumps   ROS Denies recent fever or chills. Denies sinus pressure, nasal congestion, ear pain or sore throat. Denies chest congestion, productive cough or wheezing. Denies chest pains, palpitations and leg swelling Denies abdominal pain, nausea, vomiting,diarrhea or constipation.   Denies dysuria, frequency, hesitancy or incontinence. Denies joint pain, swelling and limitation in mobility.  c/o depression, anxiety or insomnia. Denies skin break down or rash.   PE  BP (!) 130/74   Pulse 60   Resp 16   Wt 171 lb (77.6 kg)   SpO2 91%   BMI 34.54 kg/m   Patient slightly drowsy and oriented and in no cardiopulmonary distress.  HEENT: No facial asymmetry, EOMI,     Neck decreased ROM.maxillary sinus tenderness  Chest: Clear to auscultation bilaterally.  CVS: S1, S2 no murmurs, no S3.Regular rate.  ABD: Soft non tender.   Ext: No edema  MS: decreased  ROM spine, shoulders, hips and knees.  Skin: Intact, no ulcerations or rash noted.  Psych: Good eye contact, normal affect. Memory intact not anxious mildly depressed appearing.  CNS: CN 2-12 intact, power,  normal throughout.no focal deficits noted.   Assessment & Plan  Maxillary sinusitis, acute 4 day history, z pack prescribed  Bipolar disorder ([place]) Reports uncontrolled chronic depression ,ot suicidal or homicidal, managed by  Psych  Hyperlipidemia Hyperlipidemia:Low fat diet discussed and encouraged.   Lipid Panel  Lab Results  Component Value Date   CHOL 125 09/23/2019   HDL 45 09/23/2019   LDLCALC 59 09/23/2019   TRIG 117 09/23/2019   CHOLHDL 3.5 09/30/2018     Controlled, no change in medication   Essential hypertension Controlled, no change in medication DASH diet and commitment to daily physical activity for a minimum of 30 minutes discussed and encouraged, as a part of hypertension management. The importance of attaining a healthy weight is also discussed.  BP/Weight 10/09/2019 09/18/2019 08/20/2019 08/13/2019 07/16/2019 07/07/2019 10/29/2991  Systolic BP 716 967 893 810 175 102 585  Diastolic BP 74 58 69 72 77 57 57  Wt. (Lbs) 171 166.4 165.04 163.13 - 162 -  BMI 34.54 33.61 33.33 32.95 - 32.72 -  Some encounter information is confidential and restricted. Go to Review Flowsheets activity to see all data.       Light cigarette smoker (1-9 cigarettes per day) Asked:confirms currently smokes cigarettes Assess: Wants to and is willing to quit and is t cutting back Advise: needs to QUIT to reduce risk of cancer, cardio and cerebrovascular disease Assist: counseled for 5 minutes and literature provided Arrange: follow up in 3 months   Leg weakness, bilateral Home safety reviewed s due to high fall risk  Obesity (BMI 30.0-34.9)  Patient re-educated about  the importance of commitment to a  minimum of 150 minutes of exercise per week as able.  The importance of healthy food choices with portion control discussed,  as well as eating regularly and within a 12 hour window most days. The need to choose "clean , green" food 50 to 75% of the time is discussed, as well as to make water the primary drink and set a goal of 64 ounces water daily.    Weight /BMI 10/09/2019 09/18/2019 08/20/2019  WEIGHT 171 lb 166 lb 6.4 oz 165 lb 0.6 oz  HEIGHT - 4\' 11"  4\' 11"   BMI 34.54 kg/m2 33.61 kg/m2 33.33 kg/m2  Some  encounter information is confidential and restricted. Go to Review Flowsheets activity to see all data.

## 2019-10-12 DIAGNOSIS — J449 Chronic obstructive pulmonary disease, unspecified: Secondary | ICD-10-CM | POA: Diagnosis not present

## 2019-10-12 DIAGNOSIS — I10 Essential (primary) hypertension: Secondary | ICD-10-CM | POA: Diagnosis not present

## 2019-10-13 ENCOUNTER — Encounter: Payer: Self-pay | Admitting: Family Medicine

## 2019-10-13 NOTE — Assessment & Plan Note (Signed)
Hyperlipidemia:Low fat diet discussed and encouraged.   Lipid Panel  Lab Results  Component Value Date   CHOL 125 09/23/2019   HDL 45 09/23/2019   LDLCALC 59 09/23/2019   TRIG 117 09/23/2019   CHOLHDL 3.5 09/30/2018     Controlled, no change in medication

## 2019-10-13 NOTE — Assessment & Plan Note (Signed)
Home safety reviewed s due to high fall risk

## 2019-10-13 NOTE — Assessment & Plan Note (Signed)
Controlled, no change in medication DASH diet and commitment to daily physical activity for a minimum of 30 minutes discussed and encouraged, as a part of hypertension management. The importance of attaining a healthy weight is also discussed.  BP/Weight 10/09/2019 09/18/2019 08/20/2019 08/13/2019 07/16/2019 07/07/2019 8/47/3085  Systolic BP 694 370 052 591 028 902 284  Diastolic BP 74 58 69 72 77 57 57  Wt. (Lbs) 171 166.4 165.04 163.13 - 162 -  BMI 34.54 33.61 33.33 32.95 - 32.72 -  Some encounter information is confidential and restricted. Go to Review Flowsheets activity to see all data.

## 2019-10-13 NOTE — Assessment & Plan Note (Signed)
  Patient re-educated about  the importance of commitment to a  minimum of 150 minutes of exercise per week as able.  The importance of healthy food choices with portion control discussed, as well as eating regularly and within a 12 hour window most days. The need to choose "clean , green" food 50 to 75% of the time is discussed, as well as to make water the primary drink and set a goal of 64 ounces water daily.    Weight /BMI 10/09/2019 09/18/2019 08/20/2019  WEIGHT 171 lb 166 lb 6.4 oz 165 lb 0.6 oz  HEIGHT - 4\' 11"  4\' 11"   BMI 34.54 kg/m2 33.61 kg/m2 33.33 kg/m2  Some encounter information is confidential and restricted. Go to Review Flowsheets activity to see all data.

## 2019-10-13 NOTE — Assessment & Plan Note (Signed)
Reports uncontrolled chronic depression ,ot suicidal or homicidal, managed by Psych

## 2019-10-13 NOTE — Assessment & Plan Note (Signed)
Asked:confirms currently smokes cigarettes Assess: Wants to and is willing to quit and is t cutting back Advise: needs to QUIT to reduce risk of cancer, cardio and cerebrovascular disease Assist: counseled for 5 minutes and literature provided Arrange: follow up in 3 months

## 2019-10-14 ENCOUNTER — Other Ambulatory Visit: Payer: Self-pay | Admitting: Family Medicine

## 2019-10-15 ENCOUNTER — Telehealth: Payer: Self-pay

## 2019-10-15 NOTE — Telephone Encounter (Signed)
Pt is calling to see if she can get a mattress,

## 2019-10-16 ENCOUNTER — Other Ambulatory Visit: Payer: Self-pay

## 2019-10-16 DIAGNOSIS — M179 Osteoarthritis of knee, unspecified: Secondary | ICD-10-CM | POA: Diagnosis not present

## 2019-10-16 DIAGNOSIS — Z9181 History of falling: Secondary | ICD-10-CM | POA: Diagnosis not present

## 2019-10-16 DIAGNOSIS — I10 Essential (primary) hypertension: Secondary | ICD-10-CM | POA: Diagnosis not present

## 2019-10-16 DIAGNOSIS — M79632 Pain in left forearm: Secondary | ICD-10-CM | POA: Diagnosis not present

## 2019-10-16 DIAGNOSIS — M25512 Pain in left shoulder: Secondary | ICD-10-CM | POA: Diagnosis not present

## 2019-10-16 MED ORDER — UNABLE TO FIND: 0 refills | Status: DC

## 2019-10-16 NOTE — Telephone Encounter (Signed)
Order sent to [facility]

## 2019-10-20 ENCOUNTER — Other Ambulatory Visit: Payer: Self-pay

## 2019-10-20 ENCOUNTER — Ambulatory Visit (INDEPENDENT_AMBULATORY_CARE_PROVIDER_SITE_OTHER): Payer: Medicare HMO | Admitting: Psychiatry

## 2019-10-20 DIAGNOSIS — F17210 Nicotine dependence, cigarettes, uncomplicated: Secondary | ICD-10-CM | POA: Diagnosis not present

## 2019-10-20 DIAGNOSIS — F331 Major depressive disorder, recurrent, moderate: Secondary | ICD-10-CM | POA: Diagnosis not present

## 2019-10-20 DIAGNOSIS — E1142 Type 2 diabetes mellitus with diabetic polyneuropathy: Secondary | ICD-10-CM | POA: Diagnosis not present

## 2019-10-20 DIAGNOSIS — E1165 Type 2 diabetes mellitus with hyperglycemia: Secondary | ICD-10-CM | POA: Diagnosis not present

## 2019-10-20 DIAGNOSIS — F250 Schizoaffective disorder, bipolar type: Secondary | ICD-10-CM | POA: Diagnosis not present

## 2019-10-20 DIAGNOSIS — E039 Hypothyroidism, unspecified: Secondary | ICD-10-CM | POA: Diagnosis not present

## 2019-10-20 DIAGNOSIS — J42 Unspecified chronic bronchitis: Secondary | ICD-10-CM | POA: Diagnosis not present

## 2019-10-20 DIAGNOSIS — D649 Anemia, unspecified: Secondary | ICD-10-CM | POA: Diagnosis not present

## 2019-10-20 DIAGNOSIS — J449 Chronic obstructive pulmonary disease, unspecified: Secondary | ICD-10-CM | POA: Diagnosis not present

## 2019-10-20 DIAGNOSIS — M160 Bilateral primary osteoarthritis of hip: Secondary | ICD-10-CM | POA: Diagnosis not present

## 2019-10-20 NOTE — Progress Notes (Signed)
Virtual Visit via Telephone Note  I connected with [name] on 10/20/19 at 1:05 PM EDT by telephone and verified that I am speaking with the correct person using two identifiers.   I discussed the limitations, risks, security and privacy concerns of performing an evaluation and management service by telephone and the availability of in person appointments. I also discussed with the patient that there may be a patient responsible charge related to this service. The patient expressed understanding and agreed to proceed.    I provided 50 minutes of non-face-to-face time during this encounter.   [name], LCSW Virtual Visit via Telephone Note                  THERAPIST PROGRESS NOTE  Location: Patient-home [name] - [place] office  Session Time: Monday 10/20/2019 1:05 - 1:55PM          Participation Level: Active  Behavioral Response:  depressed, anxious  Type of Therapy: Individual Therapy  Treatment Goals addressed: Patient wants to learn how to improve coping skills to manage chronic pain and health issues/ improve mood  Interventions: CBT and Supportive   Summary: [name] is a 69 y.o. female who presents with  long standing history of recurrent periods  of depression beginning when she was [age] and her favorite uncle died. Patient reports multiple psychiatric hospitlaizations due to depression and suicidal ideations with the last one occuring in 1997. Patient has participated in outpatient psychotherapy and medication management intermittently since age 32. She currently is seeing psychiatrist Dr. Harrington Challenger . Prior to this, she was seen at [facility]. Patient also has had ECT at [facility]. Symptoms have worsened in recent months due to family stress and have  included depressed mood, anxiety, excessive worry, and tearfulness.         Patient's last contact was by virtual visit via telephone about 2-3  weeks ago. She continues to experience  moderate  symptoms of depression including depressed mood and tearfulness.  She reports increased sleep difficulty and feeling overwhelmed about decisions regarding her health.  She also expresses frustration others are not understanding or compassionate about her pain.  She reports she does enjoy talking with her grandson who has been contacting her more frequently.  .   Suicidal/Homicidal: Nowithout intent/plan  Therapist Response: Reviewed symptoms, discussed stressors, facilitated expression of thoughts and feelings, validated feelings, reviewed/revised treatment plan, obtained patient's permission to initial review as this was a virtual visit, continued to examine patient's thought patterns about managing pain, began to discuss patient's involvement with pain management program, also began to discuss pain management versus cure   Plan: Return again in 2 weeks.  Diagnosis: Axis I: MDD, Recurrent, moderate    Axis II: Deferred    [name] Okey E Aadhav Uhlig, LCSW

## 2019-10-21 ENCOUNTER — Encounter: Payer: Self-pay | Admitting: Podiatry

## 2019-10-21 ENCOUNTER — Other Ambulatory Visit: Payer: Self-pay

## 2019-10-21 ENCOUNTER — Ambulatory Visit (INDEPENDENT_AMBULATORY_CARE_PROVIDER_SITE_OTHER): Payer: Medicare HMO | Admitting: Podiatry

## 2019-10-21 DIAGNOSIS — E1142 Type 2 diabetes mellitus with diabetic polyneuropathy: Secondary | ICD-10-CM | POA: Diagnosis not present

## 2019-10-21 DIAGNOSIS — M79676 Pain in unspecified toe(s): Secondary | ICD-10-CM

## 2019-10-21 DIAGNOSIS — Q828 Other specified congenital malformations of skin: Secondary | ICD-10-CM | POA: Diagnosis not present

## 2019-10-21 DIAGNOSIS — B351 Tinea unguium: Secondary | ICD-10-CM

## 2019-10-22 DIAGNOSIS — I10 Essential (primary) hypertension: Secondary | ICD-10-CM

## 2019-10-22 DIAGNOSIS — K5730 Diverticulosis of large intestine without perforation or abscess without bleeding: Secondary | ICD-10-CM

## 2019-10-22 DIAGNOSIS — E1142 Type 2 diabetes mellitus with diabetic polyneuropathy: Secondary | ICD-10-CM | POA: Diagnosis not present

## 2019-10-22 DIAGNOSIS — K589 Irritable bowel syndrome without diarrhea: Secondary | ICD-10-CM

## 2019-10-22 DIAGNOSIS — Z794 Long term (current) use of insulin: Secondary | ICD-10-CM

## 2019-10-22 DIAGNOSIS — F250 Schizoaffective disorder, bipolar type: Secondary | ICD-10-CM | POA: Diagnosis not present

## 2019-10-22 DIAGNOSIS — D649 Anemia, unspecified: Secondary | ICD-10-CM | POA: Diagnosis not present

## 2019-10-22 DIAGNOSIS — Z79899 Other long term (current) drug therapy: Secondary | ICD-10-CM

## 2019-10-22 DIAGNOSIS — Z7982 Long term (current) use of aspirin: Secondary | ICD-10-CM

## 2019-10-22 DIAGNOSIS — J42 Unspecified chronic bronchitis: Secondary | ICD-10-CM

## 2019-10-22 DIAGNOSIS — M160 Bilateral primary osteoarthritis of hip: Secondary | ICD-10-CM | POA: Diagnosis not present

## 2019-10-22 DIAGNOSIS — R296 Repeated falls: Secondary | ICD-10-CM

## 2019-10-22 DIAGNOSIS — J449 Chronic obstructive pulmonary disease, unspecified: Secondary | ICD-10-CM

## 2019-10-22 DIAGNOSIS — E1165 Type 2 diabetes mellitus with hyperglycemia: Secondary | ICD-10-CM | POA: Diagnosis not present

## 2019-10-22 DIAGNOSIS — G4733 Obstructive sleep apnea (adult) (pediatric): Secondary | ICD-10-CM | POA: Diagnosis not present

## 2019-10-22 DIAGNOSIS — E039 Hypothyroidism, unspecified: Secondary | ICD-10-CM

## 2019-10-22 DIAGNOSIS — F17210 Nicotine dependence, cigarettes, uncomplicated: Secondary | ICD-10-CM | POA: Diagnosis not present

## 2019-10-22 NOTE — Progress Notes (Signed)
Subjective: [name] presents today preventative diabetic foot care and painful porokeratotic lesion(s) b/l feet and painful mycotic toenails b/l that limit ambulation. Aggravating factors include weightbearing with and without shoe gear. Pain for both is relieved with periodic professional debridement.  [name] Helper, MD is patient's PCP. Last visit was:10/09/2019.  She voices no new pedal problems on today's visit.  Past Medical History:  Diagnosis Date  . Allergy   . Anemia   . Anxiety    takes Ativan daily  . Arthritis   . Assistance needed for mobility   . Bipolar disorder ([place])    takes Risperdal nightly  . Blood transfusion   . Cancer ([place])    In her gum  . Carpal tunnel syndrome of right wrist 05/23/2011  . Cervical disc disorder with radiculopathy of cervical region 10/31/2012  . Chronic back pain   . Chronic idiopathic constipation   . Chronic neck and back pain   . Colon polyps   . COPD (chronic obstructive pulmonary disease) with chronic bronchitis ([place]) 09/16/2013   Office Spirometry 10/30/2013-submaximal effort based on appearance of loop and curve. Numbers would fit with severe restriction but her physiologic capability may be better than this. FVC 0.91/44%, and 10.74/45%, FEV1/FVC 0.81, FEF 25-75% 1.43/69%    . Diabetes mellitus    Type II  . Diverticulosis    TCS 9/08 by Dr. Delfin Edis for diarrhea . Bx for micro scopic colitis negative.   . Fibromyalgia   . Frequent falls   . GERD (gastroesophageal reflux disease)    takes Aciphex daily  . Glaucoma    eye drops daily  . Gum symptoms    infection on antibiotic  . Hemiplegia affecting non-dominant side, post-stroke 08/02/2011  . Hiatal hernia   . Hyperlipidemia    takes Crestor daily  . Hypertension    takes Amlodipine,Metoprolol,and Clonidine daily  . Hypothyroidism    takes Synthroid daily  . IBS (irritable bowel syndrome)   . Insomnia    takes Trazodone nightly  . Major depression, recurrent  ([place])    takes Zoloft daily  . 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 Metabolic encephalopathy 7/82/9562  . Migraines    chronic headaches  . Mononeuritis lower limb   . Narcolepsy   . Osteoporosis   . Pancreatitis 2006   due to Depakote with normal EUS   . Schatzki's ring    non critical / EGD with ED 8/2011with RMR  . Seizures ([place])    takes Lamictal daily.Last seizure 3 yrs ago  . Sleep apnea    on CPAP  . Small bowel obstruction ([place])   . Stroke [facility])    left sided weakness, speech changes  . Tubular adenoma of colon      Current Outpatient Medications on File Prior to Visit  Medication Sig Dispense Refill  . albuterol (VENTOLIN HFA) 108 (90 Base) MCG/ACT inhaler INHALE 1 TO 2 PUFFS EVERY 6 HOURS AS NEEDED FOR WHEEZING, SHORTNESS OF BREATH 18 g 3  . Alcohol Swabs (B-D SINGLE USE SWABS REGULAR) PADS USE TO CLEAN FINGER PRIOR TO STICKING FOR BLOOD SUGAR. 100 each 11  . alendronate (FOSAMAX) 70 MG tablet Take 1 tablet (70 mg total) by mouth every 7 (seven) days. Take with a full glass of water on an empty stomach. 12 tablet 3  . amLODipine (NORVASC) 10 MG tablet TAKE 1 TABLET EVERY DAY 90 tablet 3  . ascorbic acid (VITAMIN C) 500 MG tablet Take 500 mg by mouth  daily.    . aspirin EC 81 MG tablet Take 1 tablet (81 mg total) by mouth daily with breakfast. 120 tablet 2  . azithromycin (ZITHROMAX) 250 MG tablet Take two tablets by mouth on day one , then one tablet once daily for an additional four days 6 tablet 0  . Blood Glucose Calibration (ACCU-CHEK GUIDE CONTROL) LIQD USE PRN TO CALIBRATE GLUCOMETER 3 each 11  . blood glucose meter kit and supplies Dispense based on patient and insurance preference. Use up to four times daily as directed. (FOR ICD-10 E10.9, E11.9). 1 each 0  . buPROPion (WELLBUTRIN XL) 150 MG 24 hr tablet Take 1 tablet (150 mg total) by mouth every morning. 90 tablet 2  . cetirizine (ZYRTEC) 10 MG tablet Take 1 tablet (10 mg total) by mouth daily. 7 tablet 0  .  chlorhexidine (PERIDEX) 0.12 % solution Use as directed 15 mLs in the mouth or throat 2 (two) times daily. 473 mL 0  . clobetasol cream (TEMOVATE) 7.34 % Apply 1 application topically 2 (two) times daily.     . Continuous Blood Gluc Sensor (FREESTYLE LIBRE 14 DAY SENSOR) MISC 1 each by Does not apply route every 14 (fourteen) days. Change every 2 weeks 2 each 11  . cromolyn (NASALCROM) 5.2 MG/ACT nasal spray Place 1 spray into both nostrils 4 (four) times daily. 13 mL 1  . diclofenac sodium (VOLTAREN) 1 % GEL APPLY 2 GRAMS  TOPICALLY 4 (FOUR) TIMES DAILY. 200 g 1  . diclofenac Sodium (VOLTAREN) 1 % GEL APPLY 2 GRAMS  TOPICALLY 4 (FOUR) TIMES DAILY. 800 g 0  . dicyclomine (BENTYL) 10 MG capsule Take 1 capsule (10 mg total) by mouth 3 (three) times daily as needed for spasms. 90 capsule 2  . ezetimibe (ZETIA) 10 MG tablet Take 1 tablet (10 mg total) by mouth daily. 90 tablet 1  . glucose blood (ACCU-CHEK GUIDE) test strip USE TO CHECK BLOOD SUGAR FOUR TIMES A DAY AND PRN 400 each 11  . hydroxychloroquine (PLAQUENIL) 200 MG tablet Take 200 mg by mouth 2 (two) times daily.    . hydrOXYzine (ATARAX/VISTARIL) 25 MG tablet Take 1 tablet (25 mg total) by mouth daily as needed for itching. 15 tablet 0  . insulin aspart (NOVOLOG FLEXPEN) 100 UNIT/ML FlexPen INJECT 10 TO 14 UNITS INTO THE SKIN 3 (THREE) TIMES DAILY WITH MEALS. 45 mL 3  . Insulin Glargine, 2 Unit Dial, (TOUJEO MAX SOLOSTAR) 300 UNIT/ML SOPN Inject 25 Units into the skin at bedtime. 9 pen 3  . lamoTRIgine (LAMICTAL) 100 MG tablet Take 1 tablet (100 mg total) by mouth 2 (two) times daily. 180 tablet 2  . Lancets (ACCU-CHEK SOFT TOUCH) lancets Use as instructed to check blood sugar 3 times a day. 100 each 12  . levothyroxine (SYNTHROID) 50 MCG tablet TAKE 1 TABLET DAILY, EXCEPT TAKE 1/2 TABLET ON SUNDAY 85 tablet 1  . lidocaine (XYLOCAINE) 2 % solution RINSE WITH 15MLS AND SPIT OUT AFTER 30 SECONDS FOUR TIMES DAILY AS NEEDED FOR PAIN.    [name]  linaclotide (LINZESS) 72 MCG capsule Take 1 capsule (72 mcg total) by mouth daily before breakfast. Pharmacy-please d/c linzess 145 mcg dosage 30 capsule 2  . LORazepam (ATIVAN) 0.5 MG tablet Take 1 tablet (0.5 mg total) by mouth 3 (three) times daily. 270 tablet 2  . losartan (COZAAR) 50 MG tablet TAKE 1 TABLET EVERY DAY 90 tablet 1  . lubiprostone (AMITIZA) 8 MCG capsule Take 1 capsule (8 mcg total)  by mouth 2 (two) times daily with a meal. Pharmacy- d/c script for amitiza 24 mcg 180 capsule 0  . metaxalone (SKELAXIN) 800 MG tablet Take 800 mg by mouth 3 (three) times daily.    . metoprolol tartrate (LOPRESSOR) 50 MG tablet TAKE 1 TABLET TWICE DAILY (NEED MD APPOINTMENT) 180 tablet 3  . montelukast (SINGULAIR) 10 MG tablet TAKE 1 TABLET EVERY DAY 90 tablet 1  . NEOMYCIN-POLYMYXIN-HC, OPHTH, SUSP neomycin 3.5 mg-polymyxin 10,000 unit-hydrocort 10 mg/mL eye drop,susp    . nicotine (NICOTROL) 10 MG inhaler Inhale 1 Cartridge (1 continuous puffing total) into the lungs as needed for smoking cessation. 42 each 3  . nystatin (MYCOSTATIN/NYSTOP) powder APPLY TO AFFECTED AREA 4 TIMES DAILY. 90 g 1  . Omega-3 Fatty Acids (FISH OIL PO) Take 1 capsule by mouth daily.    . ondansetron (ZOFRAN) 4 MG tablet Take 1 tablet (4 mg total) by mouth every 8 (eight) hours as needed for nausea or vomiting. 20 tablet 0  . oxyCODONE-acetaminophen (PERCOCET) 10-325 MG tablet Take 1 tablet by mouth every 8 (eight) hours as needed.     . pregabalin (LYRICA) 75 MG capsule Take 1 capsule (75 mg total) by mouth daily.    . RABEprazole (ACIPHEX) 20 MG tablet Take 1 tablet (20 mg total) by mouth 2 (two) times daily. MUST HAVE OFFICE VISIT FOR FURTHER REFILLS 180 tablet 0  . RESTASIS 0.05 % ophthalmic emulsion Place 1 drop into both eyes 2 (two) times daily.     . risperiDONE (RISPERDAL) 0.5 MG tablet Take 1 tablet (0.5 mg total) by mouth at bedtime. 90 tablet 2  . rosuvastatin (CRESTOR) 5 MG tablet TAKE 1 TABLET AT BEDTIME 90  tablet 2  . sertraline (ZOLOFT) 100 MG tablet Take 1 tablet (100 mg total) by mouth daily. 90 tablet 2  . SHINGRIX injection     . STIOLTO RESPIMAT 2.5-2.5 MCG/ACT AERS INHALE 2 PUFFS INTO THE LUNGS DAILY. 12 g 3  . traMADol (ULTRAM) 50 MG tablet Take 50 mg by mouth every 6 (six) hours as needed.    . traZODone (DESYREL) 100 MG tablet Take 1 tablet (100 mg total) by mouth at bedtime. 90 tablet 2  . UNABLE TO [facility] bed mattress x 1  DX: G47.33, J44.9 1 each 0   No current facility-administered medications on file prior to visit.     Allergies  Allergen Reactions  . Cephalexin Hives  . Iron Nausea And Vomiting  . Milk-Related Compounds Other (See Comments)    Doesn't agree with stomach.   . Penicillins Hives    Has patient had a PCN reaction causing immediate rash, facial/tongue/throat swelling, SOB or lightheadedness with hypotension: Yes Has patient had a PCN reaction causing severe rash involving mucus membranes or skin necrosis: No Has patient had a PCN reaction that required hospitalization No Has patient had a PCN reaction occurring within the last 10 years: No If all of the above answers are "NO", then may proceed with Cephalosporin use.   [name] Phenazopyridine Hcl Hives          Objective: Shatera Rennert Blankenhorn is a pleasant 69 y.o. African American female, WD, WN in NAD. AAO x 3.  There were no vitals filed for this visit. Vascular Examination: Neurovascular status unchanged b/l. Capillary refill time to digits immediate b/l. Faintly palpable DP pulses b/l. Faintly palpable PT pulses b/l. Pedal hair sparse b/l. Skin temperature gradient within normal limits b/l.  Dermatological Examination: Pedal skin with normal  turgor, texture and tone bilaterally. No open wounds bilaterally. No interdigital macerations bilaterally. Toenails 1-5 right, L hallux, L 3rd toe, L 4th toe and L 5th toe elongated, dystrophic, thickened, and crumbly with subungual debris and tenderness to dorsal  palpation. Porokeratotic lesion(s) plantarlateral right foot x 2 and plantar aspect left heel. No erythema, no edema, no drainage, no flocculence.  Musculoskeletal: Normal muscle strength 5/5 to all lower extremity muscle groups bilaterally. No pain crepitus or joint limitation noted with ROM b/l. Hallux valgus with bunion deformity noted b/l. Utilizes cane for ambulation assistance.  Neurological Examination: Protective sensation diminished with 10g monofilament b/l. Proprioception intact bilaterally.  Assessment: 1. Pain due to onychomycosis of toenail   2. Porokeratosis   3. Diabetic polyneuropathy associated with type 2 diabetes mellitus ([place])    Plan: -Examined patient. -No new findings. No new orders. -Continue diabetic foot care principles. Literature dispensed on today.  -Toenails 1-5 b/l were debrided in length and girth with sterile nail nippers and dremel without iatrogenic bleeding.  -Painful porokeratotic lesion(s) plantarlateral right foot x 2 and plantar aspect left heel pared and enucleated with sterile scalpel blade without incident. -Patient to continue soft, supportive shoe gear daily. -Patient to report any pedal injuries to medical professional immediately. -Patient/POA to call should there be question/concern in the interim.  Return in about 3 months (around 01/21/2020) for diabetic nail and callus trim.  Marzetta Board, DPM

## 2019-10-23 ENCOUNTER — Other Ambulatory Visit: Payer: Self-pay

## 2019-10-23 ENCOUNTER — Inpatient Hospital Stay (HOSPITAL_COMMUNITY): Payer: Medicare HMO | Attending: Hematology

## 2019-10-23 DIAGNOSIS — N189 Chronic kidney disease, unspecified: Secondary | ICD-10-CM | POA: Insufficient documentation

## 2019-10-23 DIAGNOSIS — D509 Iron deficiency anemia, unspecified: Secondary | ICD-10-CM | POA: Diagnosis not present

## 2019-10-23 DIAGNOSIS — Z86010 Personal history of colonic polyps: Secondary | ICD-10-CM | POA: Diagnosis not present

## 2019-10-23 DIAGNOSIS — D631 Anemia in chronic kidney disease: Secondary | ICD-10-CM | POA: Insufficient documentation

## 2019-10-23 LAB — CBC WITH DIFFERENTIAL/PLATELET
Abs Immature Granulocytes: 0.05 10*3/uL (ref 0.00–0.07)
Basophils Absolute: 0 10*3/uL (ref 0.0–0.1)
Basophils Relative: 1 %
Eosinophils Absolute: 0.4 10*3/uL (ref 0.0–0.5)
Eosinophils Relative: 5 %
HCT: 38.7 % (ref 36.0–46.0)
Hemoglobin: 10.9 g/dL — ABNORMAL LOW (ref 12.0–15.0)
Immature Granulocytes: 1 %
Lymphocytes Relative: 30 %
Lymphs Abs: 2.2 10*3/uL (ref 0.7–4.0)
MCH: 22.7 pg — ABNORMAL LOW (ref 26.0–34.0)
MCHC: 28.2 g/dL — ABNORMAL LOW (ref 30.0–36.0)
MCV: 80.5 fL (ref 80.0–100.0)
Monocytes Absolute: 0.7 10*3/uL (ref 0.1–1.0)
Monocytes Relative: 9 %
Neutro Abs: 4 10*3/uL (ref 1.7–7.7)
Neutrophils Relative %: 54 %
Platelets: 250 10*3/uL (ref 150–400)
RBC: 4.81 MIL/uL (ref 3.87–5.11)
RDW: 14.3 % (ref 11.5–15.5)
WBC: 7.2 10*3/uL (ref 4.0–10.5)
nRBC: 0 % (ref 0.0–0.2)

## 2019-10-23 LAB — COMPREHENSIVE METABOLIC PANEL
ALT: 29 U/L (ref 0–44)
AST: 21 U/L (ref 15–41)
Albumin: 3.7 g/dL (ref 3.5–5.0)
Alkaline Phosphatase: 78 U/L (ref 38–126)
Anion gap: 8 (ref 5–15)
BUN: 29 mg/dL — ABNORMAL HIGH (ref 8–23)
CO2: 23 mmol/L (ref 22–32)
Calcium: 8.8 mg/dL — ABNORMAL LOW (ref 8.9–10.3)
Chloride: 107 mmol/L (ref 98–111)
Creatinine, Ser: 1.23 mg/dL — ABNORMAL HIGH (ref 0.44–1.00)
GFR calc Af Amer: 52 mL/min — ABNORMAL LOW (ref >60)
GFR calc non Af Amer: 45 mL/min — ABNORMAL LOW (ref >60)
Glucose, Bld: 63 mg/dL — ABNORMAL LOW (ref 70–99)
Potassium: 3.9 mmol/L (ref 3.5–5.1)
Sodium: 138 mmol/L (ref 135–145)
Total Bilirubin: 0.4 mg/dL (ref 0.3–1.2)
Total Protein: 6.9 g/dL (ref 6.5–8.1)

## 2019-10-23 LAB — IRON AND TIBC
Iron: 94 ug/dL (ref 28–170)
Saturation Ratios: 30 % (ref 10.4–31.8)
TIBC: 309 ug/dL (ref 250–450)
UIBC: 215 ug/dL

## 2019-10-23 LAB — FOLATE: Folate: 36.2 ng/mL (ref >5.9)

## 2019-10-23 LAB — LACTATE DEHYDROGENASE: LDH: 168 U/L (ref 98–192)

## 2019-10-23 LAB — VITAMIN D 25 HYDROXY (VIT D DEFICIENCY, FRACTURES): Vit D, 25-Hydroxy: 36.06 ng/mL (ref 30–100)

## 2019-10-23 LAB — VITAMIN B12: Vitamin B-12: 1961 pg/mL — ABNORMAL HIGH (ref 180–914)

## 2019-10-23 LAB — FERRITIN: Ferritin: 562 ng/mL — ABNORMAL HIGH (ref 11–307)

## 2019-10-24 ENCOUNTER — Telehealth: Payer: Self-pay | Admitting: Internal Medicine

## 2019-10-24 NOTE — Telephone Encounter (Signed)
Are you taking any type of steroids? Yes. Injections for ruptured rotary cuff.  Are you sick or becoming sick?   Is this the first elevated blood sugar reading? With the first shot  Have you tried to correct it with insulin? yes  Have you been taking/taken today all of the prescribed medications? yes  What is your current diabetic insulin or oral medication dosage?Novolog/Toujeo  Blood Sugar Log 8/6    146  147 197  188  8/7    150  142  238 219  8/8    189  181  179  171  8/9    165  151  153  283   8/10  191 198  113    94  8/11  219  198  183  99  8/12  131  179  166  243  8/13  249  185  123  Patient does not have times . The readings are for breakfast, lunch, dinner, and at bedtime.  Please advise

## 2019-10-24 NOTE — Telephone Encounter (Signed)
Please call pt to review CBG's and forward to on call provider

## 2019-10-24 NOTE — Telephone Encounter (Signed)
Spoke with patient. Relayed instructions. Patient verbalized understanding and did not have any questions.

## 2019-10-24 NOTE — Telephone Encounter (Signed)
Can  increase her Toujeo from 25 to 28 units and call on Monday to her endocrinologist

## 2019-10-24 NOTE — Telephone Encounter (Signed)
Patient states her blood sugars are "running out of control" and she has been scheduled for steroid injections in her neck.  She is asking how to get control of her current blood sugars and compensate for the rise in blood sugar the other office warned her of with steroid injections.  Call back number [phone]

## 2019-10-27 NOTE — Telephone Encounter (Signed)
Called pt >> she had 2 steroid inj's and will continue to get them every 3 mo.  She is on: - Toujeo 28 units daily - Novolog 14 units before meals  Her sugars are in the last 3 days: breakfast lunch dinner bedtime  173 241 195 210  178 103 121 280  158 189     I advised her to: - continue Toujeo 28 units daily - increase Novolog 14 units for smaller meals 16-18 units for regular meals

## 2019-10-27 NOTE — Telephone Encounter (Signed)
Patient called stating her sugars are still going up and down and when she called last week and on call Dr told her to increase her Toujeo and to give Dr Cruzita Lederer a call today to let her know the situation - patient requests to speak to nurse or Dr before she has a certain procedure. Ph# [phone]

## 2019-10-30 ENCOUNTER — Other Ambulatory Visit: Payer: Self-pay

## 2019-10-30 ENCOUNTER — Inpatient Hospital Stay (HOSPITAL_BASED_OUTPATIENT_CLINIC_OR_DEPARTMENT_OTHER): Payer: Medicare HMO | Admitting: Nurse Practitioner

## 2019-10-30 DIAGNOSIS — D509 Iron deficiency anemia, unspecified: Secondary | ICD-10-CM

## 2019-10-30 NOTE — Assessment & Plan Note (Addendum)
1.  Microcytic anemia: -This is a combination anemia from iron deficiency and chronic kidney disease. -Colonoscopy on 02/10/2016 showing 2 sessile polyps in the descending colon, diverticula in the sigmoid colon. -EGD done on 02/10/2016 showing mild to moderate esophagitis, erythema in the inferior gastric antrum. - She was placed on iron pills, but could not tolerate them due to constipation. -She denies any bright red bleeding per rectum or melena. - She last received IV Feraheme on 06/25/2019 and 07/04/2019.  She did experience some nausea and itching after her last treatment the premedication helps this. -Labs done on 10/23/2019 showed hemoglobin 10.9, ferritin 562, percent saturation 30, platelets 250 -She does not need any IV iron at this time. -She will follow-up in 3 months with repeat labs.

## 2019-10-30 NOTE — Progress Notes (Signed)
[facility] Follow up:    [name] Helper, MD [address] [place] [address]   DIAGNOSIS: Microcytic anemia  CURRENT THERAPY: Intermittent iron infusions  INTERVAL HISTORY: [name] 69 y.o. female returns for microcytic anemia. Patient reports she is doing well since her last visit. She reports the iron infusions do not help her energy levels all that much. She denies any bright red bleeding per rectum or melena. She denies any easy bruising or bleeding. Denies any nausea, vomiting, or diarrhea. Denies any new pains. Had not noticed any recent bleeding such as epistaxis, hematuria or hematochezia. Denies recent chest pain on exertion, shortness of breath on minimal exertion, pre-syncopal episodes, or palpitations. Denies any numbness or tingling in hands or feet. Denies any recent fevers, infections, or recent hospitalizations. Patient reports appetite at 75% and energy level at 75%.    Patient Active Problem List   Diagnosis Date Noted  . Tubular adenoma of colon 02/09/2019  . Benign neoplasm of cerebral meninges ([place]) 11/12/2018  . Benign meningioma of brain ([place]) 10/31/2018  . Osteoarthritis of both hips 07/06/2018  . Left arm pain 06/11/2018  . Maxillary sinusitis, acute 04/17/2018  . Lipoma of extremity 12/31/2017  . Impingement syndrome of left shoulder region 12/27/2017  . Leg weakness, bilateral 10/28/2017  . Lumbar spondylosis with myelopathy 10/12/2017  . Numbness of hand 10/10/2017  . Chronic neck pain 07/25/2017  . Chronic pain syndrome 07/25/2017  . At risk for cardiovascular event 06/10/2017  . Rash 04/25/2017  . Diabetic polyneuropathy associated with type 2 diabetes mellitus ([place]) 05/19/2016  . Tobacco user 04/24/2016  . Obesity (BMI 30.0-34.9) 04/24/2016  . History of palpitations 08/09/2015  . Nausea 08/09/2015  . Labile hypertension 08/03/2015  . Normal coronary arteries 08/03/2015  . Left-sided low back pain  with left-sided sciatica 06/27/2015  . Multinodular goiter 05/06/2015  . Rectocele, female 04/27/2015  . Anal sphincter incontinence 04/27/2015  . Pelvic relaxation due to rectocele 03/30/2015  . Pulmonary hypertension ([place]) 02/22/2015  . Light cigarette smoker (1-9 cigarettes per day) 01/11/2015  . Migraine without aura and without status migrainosus, not intractable 07/02/2014  . Flatulence 02/18/2014  . Microcytic anemia 02/18/2014  . COPD mixed type ([place]) 09/16/2013  . Hypothyroidism 08/16/2013  . Gastroparesis 04/28/2013  . Seizure disorder ([place]) 01/19/2013  . Cervical disc disorder with radiculopathy of cervical region 10/31/2012  . Solitary pulmonary nodule 08/19/2012  . Anemia 07/05/2012  . 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 Meningioma ([place]) 11/19/2011  . 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 Carpal tunnel syndrome of right wrist 05/23/2011  . Polypharmacy 04/28/2011  . Bipolar disorder ([place]) 04/28/2011  . Constipation 04/13/2011  . Recurrent falls 12/12/2010  . Oropharyngeal dysphagia 07/12/2010  . Urinary incontinence 12/16/2009  . HEARING LOSS 10/26/2009  . Well controlled type 2 diabetes mellitus with gastroparesis ([place]) 07/07/2009  . Hyperlipidemia 12/11/2008  . IBS 12/11/2008  . GERD 07/29/2008  . MILK PRODUCTS ALLERGY 07/29/2008  . 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 Essential hypertension 06/27/2007  . Backache 06/19/2007  . Osteoporosis 06/19/2007  . Obstructive sleep apnea 06/19/2007  . TRIGGER FINGER 04/18/2007  . DIVERTICULOSIS, COLON 11/13/2006    is allergic to cephalexin, iron, milk-related compounds, penicillins, and phenazopyridine hcl.  MEDICAL HISTORY: Past Medical History:  Diagnosis Date  . Allergy   . Anemia   . Anxiety    takes Ativan daily  .  Arthritis   . Assistance needed for mobility   . Bipolar disorder ([place])    takes Risperdal nightly  . Blood transfusion    . Cancer ([place])    In her gum  . Carpal tunnel syndrome of right wrist 05/23/2011  . Cervical disc disorder with radiculopathy of cervical region 10/31/2012  . Chronic back pain   . Chronic idiopathic constipation   . Chronic neck and back pain   . Colon polyps   . COPD (chronic obstructive pulmonary disease) with chronic bronchitis ([place]) 09/16/2013   Office Spirometry 10/30/2013-submaximal effort based on appearance of loop and curve. Numbers would fit with severe restriction but her physiologic capability may be better than this. FVC 0.91/44%, and 10.74/45%, FEV1/FVC 0.81, FEF 25-75% 1.43/69%    . Diabetes mellitus    Type II  . Diverticulosis    TCS 9/08 by Dr. Delfin Edis for diarrhea . Bx for micro scopic colitis negative.   . Fibromyalgia   . Frequent falls   . GERD (gastroesophageal reflux disease)    takes Aciphex daily  . Glaucoma    eye drops daily  . Gum symptoms    infection on antibiotic  . Hemiplegia affecting non-dominant side, post-stroke 08/02/2011  . Hiatal hernia   . Hyperlipidemia    takes Crestor daily  . Hypertension    takes Amlodipine,Metoprolol,and Clonidine daily  . Hypothyroidism    takes Synthroid daily  . IBS (irritable bowel syndrome)   . Insomnia    takes Trazodone nightly  . Major depression, recurrent ([place])    takes Zoloft daily  . 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 Metabolic encephalopathy 0/25/4270  . Migraines    chronic headaches  . Mononeuritis lower limb   . Narcolepsy   . Osteoporosis   . Pancreatitis 2006   due to Depakote with normal EUS   . Schatzki's ring    non critical / EGD with ED 8/2011with RMR  . Seizures ([place])    takes Lamictal daily.Last seizure 3 yrs ago  . Sleep apnea    on CPAP  . Small bowel obstruction ([place])   . Stroke [facility])    left sided weakness, speech changes  . Tubular adenoma of colon     SURGICAL HISTORY: Past Surgical History:  Procedure Laterality Date  . ABDOMINAL HYSTERECTOMY  1978  . BACK  SURGERY  July 2012  . BACTERIAL OVERGROWTH TEST N/A 05/05/2013   Procedure: BACTERIAL OVERGROWTH TEST;  Surgeon: Daneil Dolin, MD;  [facility];  Service: Endoscopy;  Laterality: N/A;  7:30  . BIOPSY THYROID  2009  . BRAIN SURGERY  11/2011   resection of meningioma  . BREAST REDUCTION SURGERY  1994  . CARDIAC CATHETERIZATION  05/10/2005   normal coronaries, normal LV systolic function and EF (Dr. Jackie Plum)  . CARPAL TUNNEL RELEASE Left 07/22/04   Dr. Aline Brochure  . CATARACT EXTRACTION Bilateral   . CHOLECYSTECTOMY  1984  . COLONOSCOPY N/A 09/25/2012   WCB:JSEGBTD diverticulosis.  colonic polyp-removed : tubular adenoma  . CRANIOTOMY  11/23/2011   Procedure: CRANIOTOMY TUMOR EXCISION;  Surgeon: Hosie Spangle, MD;  Location: [facility];  Service: Neurosurgery;  Laterality: N/A;  Craniotomy for tumor resection  . ESOPHAGOGASTRODUODENOSCOPY  12/29/2010   Rourk-Retained food in the esophagus and stomach, small hiatal hernia, status post Maloney dilation of the esophagus  . ESOPHAGOGASTRODUODENOSCOPY N/A 09/25/2012   VVO:HYWVPXTG atonic baggy esophagus status post Maloney dilation 53 F. Hiatal hernia  . GIVENS CAPSULE STUDY  N/A 01/15/2013   NORMAL.   . IR GENERIC HISTORICAL  03/17/2016   IR RADIOLOGIST EVAL & MGMT 03/17/2016 [facility]INTER[facility] RAD  . LESION REMOVAL N/A 05/31/2015   Procedure: REMOVAL RIGHT AND LEFT LESIONS OF MANDIBLE;  Surgeon: Diona Browner, DDS;  Location: [facility];  Service: Oral Surgery;  Laterality: N/A;  . MALONEY DILATION  12/29/2010   RMR;  . NM MYOCAR PERF WALL MOTION  2006   "relavtiely normal" persantine, mild anterior thinning (breast attenuation artifact), no region of scar/ischemia  . OVARIAN CYST REMOVAL    . RECTOCELE REPAIR N/A 06/29/2015   Procedure: POSTERIOR REPAIR (RECTOCELE);  Surgeon: Jonnie Kind, MD;  Location: [facility];  Service: Gynecology;  Laterality: N/A;  . REDUCTION MAMMAPLASTY Bilateral   . SPINE SURGERY  09/29/2010   Dr. Rolena Infante  . surgical  excision of 3 tumors from right thigh and right buttock  and left upper thigh  2010  . TOOTH EXTRACTION Bilateral 12/14/2014   Procedure: REMOVAL OF BILATERAL MANDIBULAR EXOSTOSES;  Surgeon: Diona Browner, DDS;  Location: [facility];  Service: Oral Surgery;  Laterality: Bilateral;  . TRANSTHORACIC ECHOCARDIOGRAM  2010   EF 60-65%, mild conc LVH, grade 1 diastolic dysfunction; mildly calcified MV annulus with mildly thickened leaflets, mildly calcified MR annulus    SOCIAL HISTORY: Social History   Socioeconomic History  . Marital status: Divorced    Spouse name: Not on file  . Number of children: 1  . Years of education: 44  . Highest education level: High school graduate  Occupational History  . Occupation: Disabled  Tobacco Use  . Smoking status: Current Every Day Smoker    Packs/day: 0.50    Years: 30.00    Pack years: 15.00    Types: Cigarettes  . Smokeless tobacco: Never Used  . Tobacco comment: 5-10 cigs a day  Vaping Use  . Vaping Use: Never used  Substance and Sexual Activity  . Alcohol use: No    Alcohol/week: 0.0 standard drinks    Comment:    . Drug use: No  . Sexual activity: Not Currently  Other Topics Concern  . Not on file  Social History Narrative   01/29/18 Lives alone, has 3 aides, Mon- Fri 8 hrs, 2 hrs on Sat-Sun, RN manages her meds   Caffeine use: Drink coffee sometimes    Right handed    Social Determinants of Health   Financial Resource Strain: Low Risk   . Difficulty of Paying Living Expenses: Not very hard  Food Insecurity: No Food Insecurity  . Worried About Charity fundraiser in the Last Year: Never true  . Ran Out of Food in the Last Year: Never true  Transportation Needs: No Transportation Needs  . Lack of Transportation (Medical): No  . Lack of Transportation (Non-Medical): No  Physical Activity: Inactive  . Days of Exercise per Week: 0 days  . Minutes of Exercise per Session: 0 min  Stress: Stress Concern Present  . Feeling of Stress :  To some extent  Social Connections: Moderately Isolated  . Frequency of Communication with Friends and Family: More than three times a week  . Frequency of Social Gatherings with Friends and Family: More than three times a week  . Attends Religious Services: More than 4 times per year  . Active Member of Clubs or Organizations: No  . Attends Archivist Meetings: Never  . Marital Status: Divorced  Human resources officer Violence: Not At Risk  . Fear of Current or  Ex-Partner: No  . Emotionally Abused: No  . Physically Abused: No  . Sexually Abused: No    FAMILY HISTORY: Family History  Problem Relation Age of Onset  . Heart attack Mother        HTN  . Pneumonia Father   . Kidney failure Father   . Diabetes Father   . Pancreatic cancer Sister   . Diabetes Brother   . Hypertension Brother   . Diabetes Brother   . Cancer Sister        breast   . Hypertension Son   . Sleep apnea Son   . Cancer Sister        pancreatic  . Stroke Maternal Grandmother   . Heart attack Maternal Grandfather   . Alcohol abuse Maternal Uncle   . Colon cancer Neg Hx   . Anesthesia problems Neg Hx   . Hypotension Neg Hx   . Malignant hyperthermia Neg Hx   . Pseudochol deficiency Neg Hx   . Breast cancer Neg Hx     Review of Systems  Constitutional: Positive for fatigue.  All other systems reviewed and are negative.     Vital signs: -Deferred due to telephone visit  Physical Exam -Deferred due to telephone visit -Patient was alert and oriented of the phone and in no acute distress.  LABORATORY DATA:  CBC    Component Value Date/Time   WBC 7.2 10/23/2019 1319   RBC 4.81 10/23/2019 1319   HGB 10.9 (L) 10/23/2019 1319   HGB 10.3 (L) 01/29/2018 1315   HCT 38.7 10/23/2019 1319   HCT 33.8 (L) 01/29/2018 1315   PLT 250 10/23/2019 1319   PLT 275 01/29/2018 1315   MCV 80.5 10/23/2019 1319   MCV 75 (L) 01/29/2018 1315   MCH 22.7 (L) 10/23/2019 1319   MCHC 28.2 (L) 10/23/2019 1319    RDW 14.3 10/23/2019 1319   RDW 12.8 01/29/2018 1315   LYMPHSABS 2.2 10/23/2019 1319   LYMPHSABS 2.4 01/29/2018 1315   MONOABS 0.7 10/23/2019 1319   EOSABS 0.4 10/23/2019 1319   EOSABS 0.2 01/29/2018 1315   BASOSABS 0.0 10/23/2019 1319   BASOSABS 0.0 01/29/2018 1315    CMP     Component Value Date/Time   NA 138 10/23/2019 1319   NA 146 (H) 02/12/2019 1150   K 3.9 10/23/2019 1319   CL 107 10/23/2019 1319   CO2 23 10/23/2019 1319   GLUCOSE 63 (L) 10/23/2019 1319   BUN 29 (H) 10/23/2019 1319   BUN 32 (H) 02/12/2019 1150   CREATININE 1.23 (H) 10/23/2019 1319   CREATININE 1.24 (H) 09/30/2018 1143   CALCIUM 8.8 (L) 10/23/2019 1319   PROT 6.9 10/23/2019 1319   PROT 6.8 02/12/2019 1150   ALBUMIN 3.7 10/23/2019 1319   ALBUMIN 4.3 02/12/2019 1150   AST 21 10/23/2019 1319   ALT 29 10/23/2019 1319   ALKPHOS 78 10/23/2019 1319   BILITOT 0.4 10/23/2019 1319   BILITOT 0.2 02/12/2019 1150   GFRNONAA 45 (L) 10/23/2019 1319   GFRNONAA 45 (L) 09/30/2018 1143   GFRAA 52 (L) 10/23/2019 1319   GFRAA 52 (L) 09/30/2018 1143    All questions were answered to patient's stated satisfaction. Encouraged patient to call with any new concerns or questions before his next visit to the cancer center and we can certain see him sooner, if needed.    ASSESSMENT and THERAPY PLAN:   Microcytic anemia 1.  Microcytic anemia: -This is a combination anemia from iron deficiency and  chronic kidney disease. -Colonoscopy on 02/10/2016 showing 2 sessile polyps in the descending colon, diverticula in the sigmoid colon. -EGD done on 02/10/2016 showing mild to moderate esophagitis, erythema in the inferior gastric antrum. - She was placed on iron pills, but could not tolerate them due to constipation. -She denies any bright red bleeding per rectum or melena. - She last received IV Feraheme on 06/25/2019 and 07/04/2019.  She did experience some nausea and itching after her last treatment the premedication helps  this. -Labs done on 10/23/2019 showed hemoglobin 10.9, ferritin 562, percent saturation 30, platelets 250 -She does not need any IV iron at this time. -She will follow-up in 3 months with repeat labs.   Orders Placed This Encounter  Procedures  . CBC with Differential/Platelet    Standing Status:   Future    Standing Expiration Date:   10/29/2020  . Comprehensive metabolic panel    Standing Status:   Future    Standing Expiration Date:   10/29/2020  . Ferritin    Standing Status:   Future    Standing Expiration Date:   10/29/2020  . Iron and TIBC    Standing Status:   Future    Standing Expiration Date:   10/29/2020  . Lactate dehydrogenase    Standing Status:   Future    Standing Expiration Date:   10/29/2020  . Vitamin B12    Standing Status:   Future    Standing Expiration Date:   10/29/2020  . VITAMIN D 25 Hydroxy (Vit-D Deficiency, Fractures)    Standing Status:   Future    Standing Expiration Date:   10/29/2020  . Folate    Standing Status:   Future    Standing Expiration Date:   10/29/2020    All questions were answered. The patient knows to call the clinic with any problems, questions or concerns. We can certainly see the patient much sooner if necessary. This note was electronically signed.  I provided 29 minutes of non face-to-face telephone visit time during this encounter, and > 50% was spent counseling as documented under my assessment & plan.   Glennie Isle, NP-C 10/30/2019

## 2019-11-01 DIAGNOSIS — D649 Anemia, unspecified: Secondary | ICD-10-CM | POA: Diagnosis not present

## 2019-11-01 DIAGNOSIS — E039 Hypothyroidism, unspecified: Secondary | ICD-10-CM | POA: Diagnosis not present

## 2019-11-01 DIAGNOSIS — M160 Bilateral primary osteoarthritis of hip: Secondary | ICD-10-CM | POA: Diagnosis not present

## 2019-11-01 DIAGNOSIS — E1165 Type 2 diabetes mellitus with hyperglycemia: Secondary | ICD-10-CM | POA: Diagnosis not present

## 2019-11-01 DIAGNOSIS — E1142 Type 2 diabetes mellitus with diabetic polyneuropathy: Secondary | ICD-10-CM | POA: Diagnosis not present

## 2019-11-01 DIAGNOSIS — F17210 Nicotine dependence, cigarettes, uncomplicated: Secondary | ICD-10-CM

## 2019-11-01 DIAGNOSIS — J42 Unspecified chronic bronchitis: Secondary | ICD-10-CM | POA: Diagnosis not present

## 2019-11-01 DIAGNOSIS — F250 Schizoaffective disorder, bipolar type: Secondary | ICD-10-CM | POA: Diagnosis not present

## 2019-11-01 DIAGNOSIS — J449 Chronic obstructive pulmonary disease, unspecified: Secondary | ICD-10-CM | POA: Diagnosis not present

## 2019-11-01 DIAGNOSIS — I10 Essential (primary) hypertension: Secondary | ICD-10-CM

## 2019-11-03 ENCOUNTER — Ambulatory Visit (INDEPENDENT_AMBULATORY_CARE_PROVIDER_SITE_OTHER): Payer: Medicare HMO | Admitting: Psychiatry

## 2019-11-03 ENCOUNTER — Other Ambulatory Visit: Payer: Self-pay

## 2019-11-03 DIAGNOSIS — M160 Bilateral primary osteoarthritis of hip: Secondary | ICD-10-CM | POA: Diagnosis not present

## 2019-11-03 DIAGNOSIS — E1142 Type 2 diabetes mellitus with diabetic polyneuropathy: Secondary | ICD-10-CM | POA: Diagnosis not present

## 2019-11-03 DIAGNOSIS — F17210 Nicotine dependence, cigarettes, uncomplicated: Secondary | ICD-10-CM | POA: Diagnosis not present

## 2019-11-03 DIAGNOSIS — F331 Major depressive disorder, recurrent, moderate: Secondary | ICD-10-CM

## 2019-11-03 DIAGNOSIS — D649 Anemia, unspecified: Secondary | ICD-10-CM | POA: Diagnosis not present

## 2019-11-03 DIAGNOSIS — J449 Chronic obstructive pulmonary disease, unspecified: Secondary | ICD-10-CM | POA: Diagnosis not present

## 2019-11-03 DIAGNOSIS — J42 Unspecified chronic bronchitis: Secondary | ICD-10-CM | POA: Diagnosis not present

## 2019-11-03 DIAGNOSIS — F250 Schizoaffective disorder, bipolar type: Secondary | ICD-10-CM | POA: Diagnosis not present

## 2019-11-03 DIAGNOSIS — E1165 Type 2 diabetes mellitus with hyperglycemia: Secondary | ICD-10-CM | POA: Diagnosis not present

## 2019-11-03 DIAGNOSIS — E039 Hypothyroidism, unspecified: Secondary | ICD-10-CM | POA: Diagnosis not present

## 2019-11-03 NOTE — Progress Notes (Signed)
Virtual Visit via Telephone Note  I connected with [name] on 11/03/19 at 1;10 PM EDT by telephone and verified that I am speaking with the correct person using two identifiers.   I discussed the limitations, risks, security and privacy concerns of performing an evaluation and management service by telephone and the availability of in person appointments. I also discussed with the patient that there may be a patient responsible charge related to this service. The patient expressed understanding and agreed to proceed.   I provided 45 minutes of non-face-to-face time during this encounter.   [name], LCSW                       THERAPIST PROGRESS NOTE  Location: [facility]-home [name] [place] office  Session Time: Monday 11/03/2019 1:10 PM - 1:55 PM         Participation Level: Active  Behavioral Response:  depressed, anxious  Type of Therapy: Individual Therapy  Treatment Goals addressed: Patient wants to learn how to improve coping skills to manage chronic pain and health issues/ improve mood  Interventions: CBT and Supportive   Summary: [name] is a 69 y.o. female who presents with  long standing history of recurrent periods  of depression beginning when she was [age] and her favorite uncle died. Patient reports multiple psychiatric hospitlaizations due to depression and suicidal ideations with the last one occuring in 1997. Patient has participated in outpatient psychotherapy and medication management intermittently since age 74. She currently is seeing psychiatrist Dr. Harrington Challenger . Prior to this, she was seen at [facility]. Patient also has had ECT at [facility]. Symptoms have worsened in recent months due to family stress and have  included depressed mood, anxiety, excessive worry, and tearfulness.         Patient's last contact was by virtual visit via telephone about 2-3  weeks ago. She continues to experience  moderate symptoms of depression.  She  reports being less overwhelmed by pain and trying to engage in activities like attending church despite her pain level. She  reports she has decided to name her grandson her health power of attorney. She still expresses frustration others don't understand her pain but expresses increased acceptance they don't understand. She reports enjoying recent visit from her granddaughter and son but still expresses frustration regarding their behavior..   Suicidal/Homicidal: Nowithout intent/plan  Therapist Response: Reviewed symptoms, discussed stressors, facilitated expression of thoughts and feelings, validated feelings, began to assist patient identify the connection between stress and pain, discussed ways patient can determine how much information she wants to share with people about her pain to reduce frustration about their responses, began to assist patient identify the effects of engagement in pleasurable activities on her pain level and ability to cope with pain, began to explore activities patient can pursue   Plan: Return again in 2 weeks.  Diagnosis: Axis I: MDD, Recurrent, moderate    Axis II: Deferred    Helayne Metsker E Mathieu Schloemer, LCSW

## 2019-11-05 ENCOUNTER — Telehealth: Payer: Self-pay

## 2019-11-05 NOTE — Telephone Encounter (Signed)
Pt wondering who would change her care hours with her home health, would it be [name] or the company she is affiliated with.

## 2019-11-05 NOTE — Telephone Encounter (Signed)
Patient is requesting an order for a wheel chair that she can manage on her own and that is light enough. Please advise.

## 2019-11-06 ENCOUNTER — Encounter: Payer: Self-pay | Admitting: Internal Medicine

## 2019-11-06 ENCOUNTER — Ambulatory Visit (INDEPENDENT_AMBULATORY_CARE_PROVIDER_SITE_OTHER): Payer: Medicare HMO | Admitting: Internal Medicine

## 2019-11-06 ENCOUNTER — Other Ambulatory Visit: Payer: Self-pay

## 2019-11-06 VITALS — BP 140/60 | HR 72 | Ht 59.0 in | Wt 167.0 lb

## 2019-11-06 DIAGNOSIS — E039 Hypothyroidism, unspecified: Secondary | ICD-10-CM | POA: Diagnosis not present

## 2019-11-06 DIAGNOSIS — E1165 Type 2 diabetes mellitus with hyperglycemia: Secondary | ICD-10-CM

## 2019-11-06 DIAGNOSIS — Z794 Long term (current) use of insulin: Secondary | ICD-10-CM | POA: Diagnosis not present

## 2019-11-06 DIAGNOSIS — E042 Nontoxic multinodular goiter: Secondary | ICD-10-CM | POA: Diagnosis not present

## 2019-11-06 LAB — POCT GLYCOSYLATED HEMOGLOBIN (HGB A1C): Hemoglobin A1C: 5.9 % — AB (ref 4.0–5.6)

## 2019-11-06 NOTE — Addendum Note (Signed)
Addended by: Cardell Peach I on: 11/06/2019 01:37 PM   Modules accepted: Orders

## 2019-11-06 NOTE — Patient Instructions (Addendum)
Please continue: - Toujeo 28 units daily  Please change: - Novolog 16 units for smaller meals 18 units for large meals  Please continue levothyroxine 50 mcg daily.  Take the thyroid hormone every day, with water, at least 30 minutes before breakfast, separated by at least 4 hours from: - acid reflux medications - calcium - iron - multivitamins  Please return in 4 months.

## 2019-11-06 NOTE — Progress Notes (Signed)
Patient ID: [name], female   DOB: 1950/12/22, 69 y.o.   MRN: [id number]  This visit occurred during the SARS-CoV-2 public health emergency.  Safety protocols were in place, including screening questions prior to the visit, additional usage of staff PPE, and extensive cleaning of exam room while observing appropriate contact time as indicated for disinfecting solutions.   HPI: [name] is a 89 y.o.-year-old female, initially referred by her PCP, [name], presenting for follow-up for DM2, dx 1995, insulin-dependent since 1997, uncontrolled, with complications (gastroparesis, cerebrovasc. Ds-  H/o stroke, PN),  Hypothyroidism, thyroid nodules.  Last visit 4 mo ago.  Sugars were much higher after last steroid inj 2 weeks ago, in the 500s, but they started to improve afterwards.  DM2: Revieed HbA1c levels:: Lab Results  Component Value Date   HGBA1C 6.4 (A) 06/24/2019   HGBA1C 5.9 (H) 02/12/2019   HGBA1C 5.4 09/30/2018  10/28/2013: HbA1c 8.5%  She contacted as well blood sugars on 10/24/2019 -reviewed message: Called pt >> she had 2 steroid inj's and will continue to get them every 3 mo.  She is on: - Toujeo 28 units daily  - Novolog 14 >> 17 units before meals  Her sugars are in the last 3 days: breakfast lunch dinner bedtime  173 241 195 210  178 103 121 280  158 189     I advised her to: - continue Toujeo 28 units daily - increase Novolog 14 units for smaller meals 16-18 units for regular meals  Last visit she was on: - Toujeo 23 >> 25 units at bedtime - Novolog: 10-14 units before a meal Could not tolerate Metformin >> nausea.   Insulin doses were decreased in 04/2017 due to low blood sugars in the 40s.  Pt checks her sugars 4 times a day per review of her log: - am: 84-144, 230 >> 196 >> 61, 78-188, 218 >> 71, 89-169, 184 - 2h after b'fast: n/c >> 110 >> n/c - before lunch:  123-149, 174, 198 >> n/c >> 91-155, 256, 348 >> 94-183, 241, 264 - 2h after lunch:   115 >> n/c >> 127, 215, 241 >> n/c - before dinner:  149-289 >> 109-209, 303, 422 >> 101-171, 269, 378, 542 (steroids) - 2h after dinner: n/c - bedtime:  146-219, 240, 249 >>169-244 >> 130-202, 236 >> 89-201, 246, 488 (steroids) - nighttime: n/c >> 134 >> n/c Lowest sugar was 79 >> 61 x1 >> 71 Ting[place]; she has hypoglycemia awareness in the 70s. Highest sugar was 241 >> 422  >> 542 (steroid inj).  Pt's meals are: - Breakfast: eggs, cereals, fruit, oatmeal, bacon - Lunch: sandwich, beef, mashed potatoes,diet soda - Dinner: chicken, beef, fish, potatoes, vegetables - Snacks: 1-2: fruit  -+ Mild CKD; last BUN/creatinine:  Lab Results  Component Value Date   BUN 29 (H) 10/23/2019   CREATININE 1.23 (H) 10/23/2019  On losartan. -+ HL; last set of lipids: Lab Results  Component Value Date   CHOL 125 09/23/2019   HDL 45 09/23/2019   LDLCALC 59 09/23/2019   TRIG 117 09/23/2019   CHOLHDL 3.5 09/30/2018  On Crestor and Zetia - last eye exam was in 2021: + DR; Had cataract sx B. Dr. Hassell Done [facility]). -+ Numbness and tingling mainly in the left legs-affected by stroke. Sees podiatry.  Hypothyroidism: -Diagnosed many years ago  Pt is on levothyroxine 50 mcg daily, taken: - in am - fasting - at least 30 min from b'fast - no Ca,  Fe, PPIs, + multivitamin with lunch and AcipHex with lunch and dinner - + Biotin and B complex  Reviewed her TFTs: Lab Results  Component Value Date   TSH 0.94 02/17/2019   TSH 0.82 09/30/2018   TSH 0.52 10/08/2017   TSH 0.827 10/15/2016   TSH 0.906 01/22/2016   Thyroid nodules.    Pt denies: - feeling nodules in neck - hoarseness - choking - SOB with lying down She has chronic dysphagia with certain foods.  She had previous esophageal dilations.  I reviewed her imaging and biopsy tests: thyroid U/S (10/2013):  Right thyroid lobe: 3.7 x 1.0 x 1.8 cm   Left thyroid lobe: 3.5 x 1.5 x 1.6 cm   Isthmus: 0.5 cm   Focal nodules: There is a 0.3 mm  calcified nodule in the right midzone. There is a 0.6 x 0.7 x 0.5 cm hypoechoic nodule in the  lateral aspect of the right midzone. There is a 0.7 x 0.6 x 0.5 cm hyperechoic nodule in the lateral aspect of the right lower pole.  There is a 2.2 x 1.1 x 1.7 cm complex solid nodule in the inferior aspect of the isthmus extending adjacent to the left lower pole.  There is a 1.4 x 2.1 x 1.4 cm complex solid nodule in the left lower pole.   Lymphadenopathy: None visualized.  Repeat U/S (01/2014): Stable appearance of the nodules  FNA (2014) x2: Benign  Repeat U/S (10/2015): stable appearance of the nodules  Repeat U/S (10/2016): Stable appearance of the nodules  She had a L rib fracture in 12/2014.  Patient was admitted with AMS + sepsis on 10/15/2016.  She had aspiration pneumonia and acute respiratory failure  She has a recurrent meningioma >> seeing Dr. Rita Ohara.  She continues steroid injections in her L shoulder.  ROS: Constitutional: no weight gain/no weight loss, no fatigue, no subjective hyperthermia, no subjective hypothermia Eyes: no blurry vision, no xerophthalmia ENT: no sore throat, no nodules palpated in neck, no dysphagia, no odynophagia, no hoarseness Cardiovascular: no CP/no SOB/no palpitations/no leg swelling Respiratory: no cough/no SOB/no wheezing Gastrointestinal: no N/no V/no D/no C/no acid reflux Musculoskeletal: no muscle aches/+ joint aches Skin: no rashes, no hair loss Neurological: no tremors/+ numbness/+ tingling/no dizziness  I reviewed pt's medications, allergies, PMH, social hx, family hx, and changes were documented in the history of present illness. Otherwise, unchanged from my initial visit note.  Past Medical History:  Diagnosis Date  . Allergy   . Anemia   . Anxiety    takes Ativan daily  . Arthritis   . Assistance needed for mobility   . Bipolar disorder ([place])    takes Risperdal nightly  . Blood transfusion   . Cancer ([place])    In her gum   . Carpal tunnel syndrome of right wrist 05/23/2011  . Cervical disc disorder with radiculopathy of cervical region 10/31/2012  . Chronic back pain   . Chronic idiopathic constipation   . Chronic neck and back pain   . Colon polyps   . COPD (chronic obstructive pulmonary disease) with chronic bronchitis ([place]) 09/16/2013   Office Spirometry 10/30/2013-submaximal effort based on appearance of loop and curve. Numbers would fit with severe restriction but her physiologic capability may be better than this. FVC 0.91/44%, and 10.74/45%, FEV1/FVC 0.81, FEF 25-75% 1.43/69%    . Diabetes mellitus    Type II  . Diverticulosis    TCS 9/08 by Dr. Delfin Edis for diarrhea . Bx for micro scopic  colitis negative.   . Fibromyalgia   . Frequent falls   . GERD (gastroesophageal reflux disease)    takes Aciphex daily  . Glaucoma    eye drops daily  . Gum symptoms    infection on antibiotic  . Hemiplegia affecting non-dominant side, post-stroke 08/02/2011  . Hiatal hernia   . Hyperlipidemia    takes Crestor daily  . Hypertension    takes Amlodipine,Metoprolol,and Clonidine daily  . Hypothyroidism    takes Synthroid daily  . IBS (irritable bowel syndrome)   . Insomnia    takes Trazodone nightly  . Major depression, recurrent ([place])    takes Zoloft daily  . 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 Metabolic encephalopathy 1/74/9449  . Migraines    chronic headaches  . Mononeuritis lower limb   . Narcolepsy   . Osteoporosis   . Pancreatitis 2006   due to Depakote with normal EUS   . Schatzki's ring    non critical / EGD with ED 8/2011with RMR  . Seizures ([place])    takes Lamictal daily.Last seizure 3 yrs ago  . Sleep apnea    on CPAP  . Small bowel obstruction ([place])   . Stroke [facility])    left sided weakness, speech changes  . Tubular adenoma of colon    Past Surgical History:  Procedure Laterality Date  . ABDOMINAL HYSTERECTOMY  1978  . BACK SURGERY  July 2012  . BACTERIAL OVERGROWTH TEST N/A  05/05/2013   Procedure: BACTERIAL OVERGROWTH TEST;  Surgeon: Daneil Dolin, MD;  [facility];  Service: Endoscopy;  Laterality: N/A;  7:30  . BIOPSY THYROID  2009  . BRAIN SURGERY  11/2011   resection of meningioma  . BREAST REDUCTION SURGERY  1994  . CARDIAC CATHETERIZATION  05/10/2005   normal coronaries, normal LV systolic function and EF (Dr. Jackie Plum)  . CARPAL TUNNEL RELEASE Left 07/22/04   Dr. Aline Brochure  . CATARACT EXTRACTION Bilateral   . CHOLECYSTECTOMY  1984  . COLONOSCOPY N/A 09/25/2012   QPR:[id number] diverticulosis.  colonic polyp-removed : tubular adenoma  . CRANIOTOMY  11/23/2011   Procedure: CRANIOTOMY TUMOR EXCISION;  Surgeon: Hosie Spangle, MD;  Location: [place] NEURO ORS;  Service: Neurosurgery;  Laterality: N/A;  Craniotomy for tumor resection  . ESOPHAGOGASTRODUODENOSCOPY  12/29/2010   [place]ourk-Retained food in the esophagus and stomach, small hiatal hernia, status post Maloney dilation of the esophagus  . ESOPHAGOGASTRODUODENOSCOPY N/A 09/25/2012   [id number] atonic baggy esophagus status post Maloney dilation 31 F. Hiatal hernia  . GIVENS CAPSULE STUDY N/A 01/15/2013   NORMAL.   . IR GENERIC HISTORICAL  03/17/2016   IR RADIOLOGIST EVAL & MGMT 03/17/2016 [facility]-INTERV RAD  . LESION REMOVAL N/A 05/31/2015   Procedure: REMOVAL RIGHT AND LEFT LESIONS OF MANDIBLE;  Surgeon: Diona Browner, DDS;  Location: [place];  Service: Oral Surgery;  Laterality: N/A;  . MALONEY DILATION  12/29/2010   RMR;  . NM MYOCAR PERF WALL MOTION  2006   "relavtiely normal" persantine, mild anterior thinning (breast attenuation artifact), no region of scar/ischemia  . OVARIAN CYST REMOVAL    . RECTOCELE REPAIR N/A 06/29/2015   Procedure: POSTERIOR REPAIR (RECTOCELE);  Surgeon: Jonnie Kind, MD;  Location: [facility] ORS;  Service: Gynecology;  Laterality: N/A;  . REDUCTION MAMMAPLASTY Bilateral   . SPINE SURGERY  09/29/2010   Dr. Rolena Infante  . surgical excision of 3 tumors from right thigh and right buttock   and left upper thigh  2010  . TOOTH  EXTRACTION Bilateral 12/14/2014   Procedure: REMOVAL OF BILATERAL MANDIBULAR EXOSTOSES;  Surgeon: Diona Browner, DDS;  Location: [place];  Service: Oral Surgery;  Laterality: Bilateral;  . TRANSTHORACIC ECHOCARDIOGRAM  2010   EF 60-65%, mild conc LVH, grade 1 diastolic dysfunction; mildly calcified MV annulus with mildly thickened leaflets, mildly calcified MR annulus   History   Social History  . Marital Status: Divorced    Spouse Name: N/A    Number of Children: 1  . Years of Education: 12   Occupational History  . disabled     Social History Main Topics  . Smoking status: Current Every Day Smoker -- 0.25 packs/day for 7 years    Types: Cigarettes  . Smokeless tobacco: Never Used     Comment: "started back but off now for 3 months" (08/18/13)  . Alcohol Use: No     Comment:    . Drug Use: No   Current Outpatient Medications on File Prior to Visit  Medication Sig Dispense Refill  . albuterol (VENTOLIN HFA) 108 (90 Base) MCG/ACT inhaler INHALE 1 TO 2 PUFFS EVERY 6 HOURS AS NEEDED FOR WHEEZING, SHORTNESS OF BREATH 18 g 3  . Alcohol Swabs (B-D SINGLE USE SWABS REGULAR) PADS USE TO CLEAN FINGER PRIOR TO STICKING FOR BLOOD SUGAR. 100 each 11  . alendronate (FOSAMAX) 70 MG tablet Take 1 tablet (70 mg total) by mouth every 7 (seven) days. Take with a full glass of water on an empty stomach. 12 tablet 3  . amLODipine (NORVASC) 10 MG tablet TAKE 1 TABLET EVERY DAY 90 tablet 3  . ascorbic acid (VITAMIN C) 500 MG tablet Take 500 mg by mouth daily.    [name] aspirin EC 81 MG tablet Take 1 tablet (81 mg total) by mouth daily with breakfast. 120 tablet 2  . azithromycin (ZITHROMAX) 250 MG tablet Take two tablets by mouth on day one , then one tablet once daily for an additional four days 6 tablet 0  . Blood Glucose Calibration (ACCU-CHEK GUIDE CONTROL) LIQD USE PRN TO CALIBRATE GLUCOMETER 3 each 11  . blood glucose meter kit and supplies Dispense based on patient and  insurance preference. Use up to four times daily as directed. (FOR ICD-10 E10.9, E11.9). 1 each 0  . buPROPion (WELLBUTRIN XL) 150 MG 24 hr tablet Take 1 tablet (150 mg total) by mouth every morning. 90 tablet 2  . cetirizine (ZYRTEC) 10 MG tablet Take 1 tablet (10 mg total) by mouth daily. 7 tablet 0  . chlorhexidine (PERIDEX) 0.12 % solution Use as directed 15 mLs in the mouth or throat 2 (two) times daily. 473 mL 0  . clobetasol cream (TEMOVATE) 8.18 % Apply 1 application topically 2 (two) times daily.     . Continuous Blood Gluc Sensor (FREESTYLE LIBRE 14 DAY SENSOR) MISC 1 each by Does not apply route every 14 (fourteen) days. Change every 2 weeks 2 each 11  . cromolyn (NASALCROM) 5.2 MG/ACT nasal spray Place 1 spray into both nostrils 4 (four) times daily. 13 mL 1  . diclofenac sodium (VOLTAREN) 1 % GEL APPLY 2 GRAMS  TOPICALLY 4 (FOUR) TIMES DAILY. 200 g 1  . diclofenac Sodium (VOLTAREN) 1 % GEL APPLY 2 GRAMS  TOPICALLY 4 (FOUR) TIMES DAILY. 800 g 0  . dicyclomine (BENTYL) 10 MG capsule Take 1 capsule (10 mg total) by mouth 3 (three) times daily as needed for spasms. 90 capsule 2  . ezetimibe (ZETIA) 10 MG tablet Take 1 tablet (10 mg  total) by mouth daily. 90 tablet 1  . glucose blood (ACCU-CHEK GUIDE) test strip USE TO CHECK BLOOD SUGAR FOUR TIMES A DAY AND PRN 400 each 11  . hydroxychloroquine (PLAQUENIL) 200 MG tablet Take 200 mg by mouth 2 (two) times daily.    . hydrOXYzine (ATARAX/VISTARIL) 25 MG tablet Take 1 tablet (25 mg total) by mouth daily as needed for itching. 15 tablet 0  . insulin aspart (NOVOLOG FLEXPEN) 100 UNIT/ML FlexPen INJECT 10 TO 14 UNITS INTO THE SKIN 3 (THREE) TIMES DAILY WITH MEALS. 45 mL 3  . Insulin Glargine, 2 Unit Dial, (TOUJEO MAX SOLOSTAR) 300 UNIT/ML SOPN Inject 25 Units into the skin at bedtime. 9 pen 3  . lamoTRIgine (LAMICTAL) 100 MG tablet Take 1 tablet (100 mg total) by mouth 2 (two) times daily. 180 tablet 2  . Lancets (ACCU-CHEK SOFT TOUCH) lancets Use  as instructed to check blood sugar 3 times a day. 100 each 12  . levothyroxine (SYNTHROID) 50 MCG tablet TAKE 1 TABLET DAILY, EXCEPT TAKE 1/2 TABLET ON SUNDAY 85 tablet 1  . lidocaine (XYLOCAINE) 2 % solution RINSE WITH 15MLS AND SPIT OUT AFTER 30 SECONDS FOUR TIMES DAILY AS NEEDED FOR PAIN.    [name] linaclotide (LINZESS) 72 MCG capsule Take 1 capsule (72 mcg total) by mouth daily before breakfast. Pharmacy-please d/c linzess 145 mcg dosage 30 capsule 2  . LORazepam (ATIVAN) 0.5 MG tablet Take 1 tablet (0.5 mg total) by mouth 3 (three) times daily. 270 tablet 2  . losartan (COZAAR) 50 MG tablet TAKE 1 TABLET EVERY DAY 90 tablet 1  . lubiprostone (AMITIZA) 8 MCG capsule Take 1 capsule (8 mcg total) by mouth 2 (two) times daily with a meal. Pharmacy- d/c script for amitiza 24 mcg 180 capsule 0  . metaxalone (SKELAXIN) 800 MG tablet Take 800 mg by mouth 3 (three) times daily.    . metoprolol tartrate (LOPRESSOR) 50 MG tablet TAKE 1 TABLET TWICE DAILY (NEED MD APPOINTMENT) 180 tablet 3  . montelukast (SINGULAIR) 10 MG tablet TAKE 1 TABLET EVERY DAY 90 tablet 1  . NEOMYCIN-POLYMYXIN-HC, OPHTH, SUSP neomycin 3.5 mg-polymyxin 10,000 unit-hydrocort 10 mg/mL eye drop,susp    . nicotine (NICOTROL) 10 MG inhaler Inhale 1 Cartridge (1 continuous puffing total) into the lungs as needed for smoking cessation. 42 each 3  . nystatin (MYCOSTATIN/NYSTOP) powder APPLY TO AFFECTED AREA 4 TIMES DAILY. 90 g 1  . Omega-3 Fatty Acids (FISH OIL PO) Take 1 capsule by mouth daily.    . ondansetron (ZOFRAN) 4 MG tablet Take 1 tablet (4 mg total) by mouth every 8 (eight) hours as needed for nausea or vomiting. 20 tablet 0  . oxyCODONE-acetaminophen (PERCOCET) 10-325 MG tablet Take 1 tablet by mouth every 8 (eight) hours as needed.     . pregabalin (LYRICA) 75 MG capsule Take 1 capsule (75 mg total) by mouth daily.    . RABEprazole (ACIPHEX) 20 MG tablet Take 1 tablet (20 mg total) by mouth 2 (two) times daily. MUST HAVE OFFICE  VISIT FOR FURTHER REFILLS 180 tablet 0  . RESTASIS 0.05 % ophthalmic emulsion Place 1 drop into both eyes 2 (two) times daily.     . risperiDONE (RISPERDAL) 0.5 MG tablet Take 1 tablet (0.5 mg total) by mouth at bedtime. 90 tablet 2  . rosuvastatin (CRESTOR) 5 MG tablet TAKE 1 TABLET AT BEDTIME 90 tablet 2  . sertraline (ZOLOFT) 100 MG tablet Take 1 tablet (100 mg total) by mouth daily. 90 tablet 2  .  SHINGRIX injection     . STIOLTO RESPIMAT 2.5-2.5 MCG/ACT AERS INHALE 2 PUFFS INTO THE LUNGS DAILY. 12 g 3  . traMADol (ULTRAM) 50 MG tablet Take 50 mg by mouth every 6 (six) hours as needed.    . traZODone (DESYREL) 100 MG tablet Take 1 tablet (100 mg total) by mouth at bedtime. 90 tablet 2  . UNABLE TO [facility] bed mattress x 1  DX: G47.33, J44.9 1 each 0   No current facility-administered medications on file prior to visit.      Allergies  Allergen Reactions  . Cephalexin Hives  . Iron Nausea And Vomiting  . Milk-Related Compounds Other (See Comments)    Doesn't agree with stomach.   . Penicillins Hives       . Phenazopyridine Hcl Hives         Family History  Problem Relation Age of Onset  . Heart attack Mother        HTN  . Pneumonia Father   . Kidney failure Father   . Diabetes Father   . Pancreatic cancer Sister   . Diabetes Brother   . Hypertension Brother   . Diabetes Brother   . Cancer Sister        breast   . Hypertension Son   . Sleep apnea Son   . Cancer Sister        pancreatic  . Stroke Maternal Grandmother   . Heart attack Maternal Grandfather   . Alcohol abuse Maternal Uncle   . Colon cancer Neg Hx   . Anesthesia problems Neg Hx   . Hypotension Neg Hx   . Malignant hyperthermia Neg Hx   . Pseudochol deficiency Neg Hx   . Breast cancer Neg Hx    PE: BP 140/60   Pulse 72   Ht 4' 11"  (1.499 m)   Wt 167 lb (75.8 kg)   SpO2 97%   BMI 33.73 kg/m  Body mass index is 33.73 kg/m.  Wt Readings from Last 3 Encounters:  11/06/19 167 lb (75.8  kg)  10/09/19 171 lb (77.6 kg)  09/18/19 166 lb 6.4 oz (75.5 kg)   Constitutional: overweight, in NAD Eyes: PERRLA, EOMI, no exophthalmos ENT: moist mucous membranes, no thyromegaly, no cervical lymphadenopathy Cardiovascular: RRR, No MRG Respiratory: CTA B Gastrointestinal: abdomen soft, NT, ND, BS+ Musculoskeletal: no deformities, strength intact in all 4 Skin: moist, warm, no rashes Neurological: no tremor with outstretched hands, DTR normal in all 4  ASSESSMENT: 1. DM2, insulin-dependent, uncontrolled, without complications - gastroparesis - cerebrovasc. Ds -  H/o stroke - PN  She was interested in an insulin pump >> discussed about [employer] (given brochure).  2. Hypothyroidism  3. MNG - Thyroid U/S (10/11/2012):  Right thyroid lobe: 3.7 x 1.0 x 1.8 cm  Left thyroid lobe: 3.5 x 1.5 x 1.6 cm  Isthmus: 0.5 cm  Focal nodules: There is a 0.3 mm calcified nodule in the right midzone. There is a 0.6 x 0.7 x 0.5 cm hypoechoic nodule in the lateral aspect of the right midzone. There is a 0.7 x 0.6 x 0.5 cm hyperechoic nodule in the lateral aspect of the right lower pole. There is a 2.2 x 1.1 x 1.7 cm complex solid nodule in the inferior aspect of the isthmus extending adjacent to the left lower pole. There is a 1.4 x 2.1 x 1.4 cm complex solid nodule in the left lower pole.  Lymphadenopathy: None visualized.  IMPRESSION: Multi nodular goiter which has markedly  progressed since the prior exam. The dominant nodule at the inferior aspect of the isthmus fits criteria for fine needle aspiration biopsy if not previously Assessed.  - FNA (11/05/2012) x2: benign  - Thyroid U/S (01/20/2014) - felt one nodule enlarging >> new U/S: stable appearance of the nodules, except a small new nodule in isthmus: 1.2 cm   Right thyroid lobe Measurements: 4.4 cm x 1.2 cm x 1.7 cm. Multiple right-sided nodules identified. Each right-sided nodule demonstrates increased echogenicity, with  the superior measuring 7 mm -8 mm, most inferior measuring 9 mm - 10 mm. A small, 3 mm focus of calcium with posterior shadowing is evident. There is also a mid nodule measuring 7 mm, which is echogenic.  Left thyroid lobe Measurements: 5.1 cm x 2.1 cm x 2.3 cm. Dominant lesion at the inferior pole of left thyroid is again evident, which has been previously biopsied (10/29/2012). This nodule measures 1.8 cm x 1.7cm x 2.5 cm. (Previous 1.4 cm x 2.1 cm x 1.4 cm)  Isthmus Thickness: 4 mm-5 mm. Isthmic nodule again noted, previously biopsied (10/29/2012). Currently this nodule measures 2.2 cm x 1.4 cm x 1.8 cm. (previous measurement 2.2 cm x 1.1 cm x 1.7 cm). There is a new nodule identified within the isthmus with heterogeneously hyperechoic characteristics. This nodule measures 12 mm x 5.4 mm x 7.2 mm.  Lymphadenopathy: None visualized.  - Thyroid U/S (11/09/2015): Details   Reading Physician Reading Date Result Priority  Sandi Mariscal, MD 11/09/2015   Narrative    COMPARISON: Thyroid ultrasound - 01/20/2014 ; 10/11/2012  FINDINGS: Parenchymal Echotexture: Moderately heterogenous Estimated total number of nodules > 1 cm: <5 Number of spongiform nodules > 2 cm not described below (TR1): 0 Number of mixed cystic nodules > 1.5 cm not described below ([place]): 0 _____________________________________________________  Isthmus: 0.7 cm  Nodule # 1: Prior biopsy: No Location: Isthmus; left Size: 1.5 x 1.3 x 1.5 cm, previously 2.2 x 1.4 x 1.8 cm Composition: solid/almost completely solid (2) Echogenicity: hyperechoic (1) ACR TI-RADS total points: 3. Change in features: Yes. Increased internal cystic degeneration and smaller in size. Change in ACR TI-RADS risk category: No  *Given size (1.5 - 2.4 cm) and appearance, a follow-up ultrasound in 1 year is recommended based on TI-RADS criteria as clinically indicated.  _________________________________________________________  Right lobe: 4.6 x  1.1 x 1.9 cm, previously 4.4 x 1.2 x 1.7 cm Stable scattered subcentimeter echogenic nodules _________________________________________________________  Left lobe: 4.8 x 2.1 x 1.9 cm, previously 5.1 x 2.1 x 2.3 cm Nodule # 1: Prior biopsy: No Location: Left; Inferior Size: 2.4 x 1.7 x 1.9 cm, previously 2.5 x 1.7 x 1.7 Composition: solid/almost completely solid (2) Echogenicity: hyperechoic (1) ACR TI-RADS total points: 3. ACR TI-RADS risk category: TR3 (3 points). Change in features: No Change in ACR TI-RADS risk category: No *Given size (1.5 - 2.4 cm) and appearance, a follow-up ultrasound in 1 year is recommended based on TI-RADS criteria as clinically indicated.  IMPRESSION: TR 3 isthmic and left thyroid nodules unchanged. *Given size (1.5 - 2.4 cm) and appearance of both thyroid nodules, a follow-up ultrasound in 1 year is recommended based on TI-RADS criteria as clinically indicated.  The above is in keeping with the ACR TI-RADS recommendations Natasha Mead Coll Radiol 2017;14:[id number].        10/12/2016: Thyroid U/S: Parenchymal Echotexture: Moderately heterogenous Isthmus: 1.1 cm Right lobe: 3.8 x 1.0 x 1.7 cm Left lobe: 4.5 x 1.7 x 2.1 cm ______________________________________________________  Estimated total number of nodules >/= 1 cm: 3 _____________________________________________  Diffusely heterogeneous multinodular thyroid gland. As seen previously, there are multiple small echogenic nodules scattered throughout the right gland which do not meet criteria for biopsy or follow-up. Head and lobular pyramidal lobe extends superiorly from the thyroid isthmus. No interval change.  The previously biopsied nodule in the inferior aspect of the isthmus measures slightly smaller today at 1.5 x 1.0 x 1.4 cm compared to 1.7 x 1.3 x 1.5 cm previously.  The previously biopsied nodule in the left inferior gland is essentially unchanged at 2.6 x 1.3 x 1.5 cm compared  to 2.4 x 1.8 x 1.9 cm. No new nodule or abnormality identified.  IMPRESSION: 1. Stable multinodular goiter with a prominent lobular parental lobe extending superiorly from the thyroid isthmus. 2. The previously biopsied nodule in the inferior isthmus measures slightly smaller on today's examination. 3. The previously biopsied nodule in the left inferior gland is stable. 4. Additional scattered small and benign-appearing nodules do not meet criteria for biopsy or continued follow-up. The above is in keeping with the ACR TI-RADS recommendations - J Am Coll Radiol 2017;14:[id number].   PLAN:  1. Patient with longstanding, uncontrolled, type 2 diabetes, on basal/bolus insulin regimen, with fluctuating CBGs but much higher when she gets steroid injections in her she contacted [name] earlier this month with high blood sugars after steroid injections and we increased her NovoLog doses.  Unfortunately, she needs to get the steroids injections repeatedly, since she is not a good candidate for surgery. -She continues to get iron infusions, which may influence her HbA1c. Last: 3 mo ago. -At this visit, reviewing her detailed sugar log, her sugars are mostly at goal with occasional blood sugars above goal, but with significant increase in blood sugars after her steroid injection.  At that time she called [name] and we increased her NovoLog dose.  She is not taking 17 units for meals, did not increase it further.  I will advise him to take 16 units before a smaller meal and 18 units for regular meal.  We will continue the same dose of Toujeo for now. - I advised her to:  Patient Instructions  Please continue: - Toujeo 28 units daily  Please change: - Novolog 16 units for smaller meals 18 units for large meals  Please continue levothyroxine 50 mcg daily.  Take the thyroid hormone every day, with water, at least 30 minutes before breakfast, separated by at least 4 hours from: - acid reflux medications -  calcium - iron - multivitamins  Please return in 4 months.  - we checked her HbA1c: 5.9% (lower) - advised to check sugars at different times of the day - 3-4x a day, rotating check times - advised for yearly eye exams >> she is UTD - return to clinic in 4 months   2. Hypothyroidism -On levothyroxine 50 mcg daily - latest thyroid labs reviewed with pt >> normal: Lab Results  Component Value Date   TSH 0.94 02/17/2019  - pt feels good on this dose. - we discussed about taking the thyroid hormone every day, with water, >30 minutes before breakfast, separated by >4 hours from acid reflux medications, calcium, iron, multivitamins. Pt. is taking it correctly. -We will repeat her TFTs at next visit  3. MNG -She has a history of 2 benign thyroid nodules biopsied in 2014 -Reviewed the thyroid ultrasound report from 2014-2018 and the nodules appear stable -No need to repeat a thyroid ultrasound unless she  starts to develop new neck compression symptoms.  She has mild chronic dysphagia but does not have esophageal compression per the barium swallow study from 08/2014.  She was found to have esophageal dysmotility and had multiple esophageal dilations. -We will continue to follow this expectantly for now   Philemon Kingdom, MD PhD [facility] Endocrinology

## 2019-11-09 NOTE — Telephone Encounter (Signed)
June visit has been addended so that this can be requested, please order, thanks

## 2019-11-09 NOTE — Assessment & Plan Note (Signed)
Needs assistive device for safe mobility both inside and outside of her home Requests lightweight wheelchair which she can manipulate independently. Same will be ordered

## 2019-11-09 NOTE — Assessment & Plan Note (Signed)
Lower extremity weakness and chronic back pain make safe ambulation a challenge. Request for light weiught wheelchair for use inside and outside of her home will improve safety and quality of life also independence

## 2019-11-10 ENCOUNTER — Other Ambulatory Visit: Payer: Self-pay

## 2019-11-10 ENCOUNTER — Telehealth: Payer: Self-pay | Admitting: Family Medicine

## 2019-11-10 DIAGNOSIS — R29898 Other symptoms and signs involving the musculoskeletal system: Secondary | ICD-10-CM

## 2019-11-10 MED ORDER — METOPROLOL TARTRATE 50 MG PO TABS: ORAL_TABLET | ORAL | 3 refills | Status: DC

## 2019-11-10 NOTE — Telephone Encounter (Signed)
Notes and order faxed to [facility]

## 2019-11-10 NOTE — Telephone Encounter (Signed)
Pt is calling concerning her last blood work she was told by Dr Renne Crigler that her kidneys were not functioning good would like to know more about this?

## 2019-11-10 NOTE — Telephone Encounter (Signed)
Patient called she has questions about her latest blood work  Phone number is [phone]

## 2019-11-10 NOTE — Telephone Encounter (Signed)
She was dehydrated when she had the lab, needs to ensure she drinks 64 ounces water daily, rept non fast chem 7 and EGFR Sept 20

## 2019-11-12 ENCOUNTER — Other Ambulatory Visit: Payer: Self-pay | Admitting: *Deleted

## 2019-11-12 DIAGNOSIS — J449 Chronic obstructive pulmonary disease, unspecified: Secondary | ICD-10-CM | POA: Diagnosis not present

## 2019-11-12 DIAGNOSIS — I10 Essential (primary) hypertension: Secondary | ICD-10-CM | POA: Diagnosis not present

## 2019-11-12 NOTE — Telephone Encounter (Signed)
Spoke with pt she is aware and to have labs redrawn 9-20

## 2019-11-12 NOTE — Telephone Encounter (Signed)
LVM for pt to call the office.

## 2019-11-13 ENCOUNTER — Other Ambulatory Visit: Payer: Self-pay | Admitting: Internal Medicine

## 2019-11-14 ENCOUNTER — Other Ambulatory Visit: Payer: Self-pay

## 2019-11-14 ENCOUNTER — Encounter (HOSPITAL_COMMUNITY): Payer: Self-pay | Admitting: Psychiatry

## 2019-11-14 ENCOUNTER — Telehealth (INDEPENDENT_AMBULATORY_CARE_PROVIDER_SITE_OTHER): Payer: Medicare HMO | Admitting: Psychiatry

## 2019-11-14 DIAGNOSIS — J42 Unspecified chronic bronchitis: Secondary | ICD-10-CM | POA: Diagnosis not present

## 2019-11-14 DIAGNOSIS — F331 Major depressive disorder, recurrent, moderate: Secondary | ICD-10-CM | POA: Diagnosis not present

## 2019-11-14 DIAGNOSIS — M160 Bilateral primary osteoarthritis of hip: Secondary | ICD-10-CM | POA: Diagnosis not present

## 2019-11-14 DIAGNOSIS — E1142 Type 2 diabetes mellitus with diabetic polyneuropathy: Secondary | ICD-10-CM | POA: Diagnosis not present

## 2019-11-14 DIAGNOSIS — D649 Anemia, unspecified: Secondary | ICD-10-CM | POA: Diagnosis not present

## 2019-11-14 DIAGNOSIS — F17210 Nicotine dependence, cigarettes, uncomplicated: Secondary | ICD-10-CM | POA: Diagnosis not present

## 2019-11-14 DIAGNOSIS — F250 Schizoaffective disorder, bipolar type: Secondary | ICD-10-CM | POA: Diagnosis not present

## 2019-11-14 DIAGNOSIS — E1165 Type 2 diabetes mellitus with hyperglycemia: Secondary | ICD-10-CM | POA: Diagnosis not present

## 2019-11-14 DIAGNOSIS — J449 Chronic obstructive pulmonary disease, unspecified: Secondary | ICD-10-CM | POA: Diagnosis not present

## 2019-11-14 DIAGNOSIS — E039 Hypothyroidism, unspecified: Secondary | ICD-10-CM | POA: Diagnosis not present

## 2019-11-14 MED ORDER — LORAZEPAM 0.5 MG PO TABS: 0.5000 mg | ORAL_TABLET | Freq: Three times a day (TID) | ORAL | 2 refills | Status: DC

## 2019-11-14 MED ORDER — TRAZODONE HCL 100 MG PO TABS
100.0000 mg | ORAL_TABLET | Freq: Every day | ORAL | 2 refills | Status: DC
Start: 2019-11-14 — End: 2020-02-13

## 2019-11-14 MED ORDER — SERTRALINE HCL 100 MG PO TABS
100.0000 mg | ORAL_TABLET | Freq: Every day | ORAL | 2 refills | Status: DC
Start: 2019-11-14 — End: 2020-02-13

## 2019-11-14 MED ORDER — RISPERIDONE 0.5 MG PO TABS
0.5000 mg | ORAL_TABLET | Freq: Every day | ORAL | 2 refills | Status: DC
Start: 2019-11-14 — End: 2020-02-13

## 2019-11-14 MED ORDER — LAMOTRIGINE 100 MG PO TABS
100.0000 mg | ORAL_TABLET | Freq: Two times a day (BID) | ORAL | 2 refills | Status: DC
Start: 2019-11-14 — End: 2020-02-13

## 2019-11-14 MED ORDER — BUPROPION HCL ER (XL) 150 MG PO TB24: 150.0000 mg | ORAL_TABLET | ORAL | 2 refills | Status: DC

## 2019-11-14 NOTE — Progress Notes (Signed)
Virtual Visit via Telephone Note  I connected with [name] on 11/14/19 at 11:20 AM EDT by telephone and verified that I am speaking with the correct person using two identifiers.   I discussed the limitations, risks, security and privacy concerns of performing an evaluation and management service by telephone and the availability of in person appointments. I also discussed with the patient that there may be a patient responsible charge related to this service. The patient expressed understanding and agreed to proceed. :    I discussed the assessment and treatment plan with the patient. The patient was provided an opportunity to ask questions and all were answered. The patient agreed with the plan and demonstrated an understanding of the instructions.   The patient was advised to call back or seek an in-person evaluation if the symptoms worsen or if the condition fails to improve as anticipated.  I provided 15 minutes of non-face-to-face time during this encounter. Location: Provider Home, patient home  [name] Spiller, MD  [facility] MD/PA/NP OP Progress Note  11/14/2019 11:35 AM [name]  MRN:  [id number]  Chief Complaint:  Chief Complaint    Depression; Anxiety; Follow-up     HPI: This patient is a 69 year old divorced black female who lives alone in [place].  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  The patient returns for follow-up after 3 months for history of depression and anxiety.  As usual she has a lot of somatic complaints that she is continuing to have pain in her back neck and shoulder.  She states that she has been somewhat depressed but cannot give any recent stressors.  She is working with therapist [name].  She states she is trying to recount all the things that happened to her through her life that were both good and bad.  She tends to focus on the bed at night urged her not to do so.  She denies thoughts of self-harm and states that  she and [name] is living on her own and does not want to go into a nursing home or other facility.  She denies severe anxiety most of the time.  I explained to her that given her age we cannot add any further psychiatric medication at this point and she thinks that she is stable on the current dosages. Visit Diagnosis:    ICD-10-CM   1. Major depressive disorder, recurrent episode, moderate (HCC)  F33.1     Past Psychiatric History: Numerous hospitalizations for depression years ago including ECT treatment  Past Medical History:  Past Medical History:  Diagnosis Date  . Allergy   . Anemia   . Anxiety    takes Ativan daily  . Arthritis   . Assistance needed for mobility   . Bipolar disorder ([place])    takes Risperdal nightly  . Blood transfusion   . Cancer ([place])    In her gum  . Carpal tunnel syndrome of right wrist 05/23/2011  . Cervical disc disorder with radiculopathy of cervical region 10/31/2012  . Chronic back pain   . Chronic idiopathic constipation   . Chronic neck and back pain   . Colon polyps   . COPD (chronic obstructive pulmonary disease) with chronic bronchitis ([place]) 09/16/2013   Office Spirometry 10/30/2013-submaximal effort based on appearance of loop and curve. Numbers would fit with severe restriction but her physiologic capability may be better than this. FVC 0.91/44%, and 10.74/45%, FEV1/FVC 0.81, FEF 25-75% 1.43/69%    .  Diabetes mellitus    Type II  . Diverticulosis    TCS 9/08 by Dr. Delfin Edis for diarrhea . Bx for micro scopic colitis negative.   . Fibromyalgia   . Frequent falls   . GERD (gastroesophageal reflux disease)    takes Aciphex daily  . Glaucoma    eye drops daily  . Gum symptoms    infection on antibiotic  . Hemiplegia affecting non-dominant side, post-stroke 08/02/2011  . Hiatal hernia   . Hyperlipidemia    takes Crestor daily  . Hypertension    takes Amlodipine,Metoprolol,and Clonidine daily  . Hypothyroidism    takes Synthroid daily  .  IBS (irritable bowel syndrome)   . Insomnia    takes Trazodone nightly  . Major depression, recurrent ([place])    takes Zoloft daily  . 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 Metabolic encephalopathy 3/71/0626  . Migraines    chronic headaches  . Mononeuritis lower limb   . Narcolepsy   . Osteoporosis   . Pancreatitis 2006   due to Depakote with normal EUS   . Schatzki's ring    non critical / EGD with ED 8/2011with RMR  . Seizures ([place])    takes Lamictal daily.Last seizure 3 yrs ago  . Sleep apnea    on CPAP  . [name] bowel obstruction ([place])   . Stroke [facility])    left sided weakness, speech changes  . Tubular adenoma of colon     Past Surgical History:  Procedure Laterality Date  . ABDOMINAL HYSTERECTOMY  1978  . BACK SURGERY  July 2012  . BACTERIAL OVERGROWTH TEST N/A 05/05/2013   Procedure: BACTERIAL OVERGROWTH TEST;  Surgeon: Daneil Dolin, MD;  [facility];  Service: Endoscopy;  Laterality: N/A;  7:30  . BIOPSY THYROID  2009  . BRAIN SURGERY  11/2011   resection of meningioma  . BREAST REDUCTION SURGERY  1994  . CARDIAC CATHETERIZATION  05/10/2005   normal coronaries, normal LV systolic function and EF (Dr. Jackie Plum)  . CARPAL TUNNEL RELEASE Left 07/22/04   Dr. Aline Brochure  . CATARACT EXTRACTION Bilateral   . CHOLECYSTECTOMY  1984  . COLONOSCOPY N/A 09/25/2012   RSW:NIOEVOJ diverticulosis.  colonic polyp-removed : tubular adenoma  . CRANIOTOMY  11/23/2011   Procedure: CRANIOTOMY TUMOR EXCISION;  Surgeon: Hosie Spangle, MD;  Location: [facility];  Service: Neurosurgery;  Laterality: N/A;  Craniotomy for tumor resection  . ESOPHAGOGASTRODUODENOSCOPY  12/29/2010   Rourk-Retained food in the esophagus and stomach, [name] hiatal hernia, status post Maloney dilation of the esophagus  . ESOPHAGOGASTRODUODENOSCOPY N/A 09/25/2012   [id number] atonic baggy esophagus status post Maloney dilation 86 F. Hiatal hernia  . GIVENS CAPSULE STUDY N/A 01/15/2013   NORMAL.   . IR  GENERIC HISTORICAL  03/17/2016   IR RADIOLOGIST EVAL & MGMT 03/17/2016 [facility]-INTERV RAD  . LESION REMOVAL N/A 05/31/2015   Procedure: REMOVAL RIGHT AND LEFT LESIONS OF MANDIBLE;  Surgeon: Diona Browner, DDS;  Location: [facility];  Service: Oral Surgery;  Laterality: N/A;  . MALONEY DILATION  12/29/2010   RMR;  . NM MYOCAR PERF WALL MOTION  2006   "relavtiely normal" persantine, mild anterior thinning (breast attenuation artifact), no region of scar/ischemia  . OVARIAN CYST REMOVAL    . RECTOCELE REPAIR N/A 06/29/2015   Procedure: POSTERIOR REPAIR (RECTOCELE);  Surgeon: Jonnie Kind, MD;  Location: [facility];  Service: Gynecology;  Laterality: N/A;  . REDUCTION MAMMAPLASTY Bilateral   . SPINE SURGERY  09/29/2010  Dr. Rolena Infante  . surgical excision of 3 tumors from right thigh and right buttock  and left upper thigh  2010  . TOOTH EXTRACTION Bilateral 12/14/2014   Procedure: REMOVAL OF BILATERAL MANDIBULAR EXOSTOSES;  Surgeon: Diona Browner, DDS;  Location: [facility];  Service: Oral Surgery;  Laterality: Bilateral;  . TRANSTHORACIC ECHOCARDIOGRAM  2010   EF 60-65%, mild conc LVH, grade 1 diastolic dysfunction; mildly calcified MV annulus with mildly thickened leaflets, mildly calcified MR annulus    Family Psychiatric History: see below  Family History:  Family History  Problem Relation Age of Onset  . Heart attack Mother        HTN  . Pneumonia Father   . Kidney failure Father   . Diabetes Father   . Pancreatic cancer Sister   . Diabetes Brother   . Hypertension Brother   . Diabetes Brother   . Cancer Sister        breast   . Hypertension Son   . Sleep apnea Son   . Cancer Sister        pancreatic  . Stroke Maternal Grandmother   . Heart attack Maternal Grandfather   . Alcohol abuse Maternal Uncle   . Colon cancer Neg Hx   . Anesthesia problems Neg Hx   . Hypotension Neg Hx   . Malignant hyperthermia Neg Hx   . Pseudochol deficiency Neg Hx   . Breast cancer Neg Hx     Social History:   Social History   Socioeconomic History  . Marital status: Divorced    Spouse name: Not on file  . Number of children: 1  . Years of education: 52  . Highest education level: High school graduate  Occupational History  . Occupation: Disabled  Tobacco Use  . Smoking status: Current Every Day Smoker    Packs/day: 0.50    Years: 30.00    Pack years: 15.00    Types: Cigarettes  . Smokeless tobacco: Never Used  . Tobacco comment: 5-10 cigs a day  Vaping Use  . Vaping Use: Never used  Substance and Sexual Activity  . Alcohol use: No    Alcohol/week: 0.0 standard drinks    Comment:    . Drug use: No  . Sexual activity: Not Currently  Other Topics Concern  . Not on file  Social History Narrative   01/29/18 Lives alone, has 3 aides, Mon- Fri 8 hrs, 2 hrs on Sat-Sun, RN manages her meds   Caffeine use: Drink coffee sometimes    Right handed    Social Determinants of Health   Financial Resource Strain: Low Risk   . Difficulty of Paying Living Expenses: Not very hard  Food Insecurity: No Food Insecurity  . Worried About Charity fundraiser in the Last Year: Never true  . Ran Out of Food in the Last Year: Never true  Transportation Needs: No Transportation Needs  . Lack of Transportation (Medical): No  . Lack of Transportation (Non-Medical): No  Physical Activity: Inactive  . Days of Exercise per Week: 0 days  . Minutes of Exercise per Session: 0 min  Stress: Stress Concern Present  . Feeling of Stress : To some extent  Social Connections: Moderately Isolated  . Frequency of Communication with Friends and Family: More than three times a week  . Frequency of Social Gatherings with Friends and Family: More than three times a week  . Attends Religious Services: More than 4 times per year  . Active Member of  Clubs or Organizations: No  . Attends Archivist Meetings: Never  . Marital Status: Divorced    Allergies:  Allergies  Allergen Reactions  . Cephalexin Hives   . Iron Nausea And Vomiting  . Milk-Related Compounds Other (See Comments)    Doesn't agree with stomach.   . Penicillins Hives    Has patient had a PCN reaction causing immediate rash, facial/tongue/throat swelling, SOB or lightheadedness with hypotension: Yes Has patient had a PCN reaction causing severe rash involving mucus membranes or skin necrosis: No Has patient had a PCN reaction that required hospitalization No Has patient had a PCN reaction occurring within the last 10 years: No If all of the above answers are "NO", then may proceed with Cephalosporin use.   [name] Phenazopyridine Hcl Hives          Metabolic Disorder Labs: Lab Results  Component Value Date   HGBA1C 5.9 (A) 11/06/2019   MPG 108 09/30/2018   MPG 134 10/15/2016   No results found for: PROLACTIN Lab Results  Component Value Date   CHOL 125 09/23/2019   TRIG 117 09/23/2019   HDL 45 09/23/2019   CHOLHDL 3.5 09/30/2018   VLDL 26 07/19/2015   LDLCALC 59 09/23/2019   LDLCALC 106 (H) 02/12/2019   Lab Results  Component Value Date   TSH 0.94 02/17/2019   TSH 0.82 09/30/2018    Therapeutic Level Labs: Lab Results  Component Value Date   LITHIUM <0.06 (L) 10/15/2016   Lab Results  Component Value Date   VALPROATE <10.0 (L) 08/23/2007   No components found for:  CBMZ  Current Medications: Current Outpatient Medications  Medication Sig Dispense Refill  . albuterol (VENTOLIN HFA) 108 (90 Base) MCG/ACT inhaler INHALE 1 TO 2 PUFFS EVERY 6 HOURS AS NEEDED FOR WHEEZING, SHORTNESS OF BREATH 18 g 3  . Alcohol Swabs (B-D SINGLE USE SWABS REGULAR) PADS USE TO CLEAN FINGER PRIOR TO STICKING FOR BLOOD SUGAR. 100 each 11  . alendronate (FOSAMAX) 70 MG tablet Take 1 tablet (70 mg total) by mouth every 7 (seven) days. Take with a full glass of water on an empty stomach. 12 tablet 3  . amLODipine (NORVASC) 10 MG tablet TAKE 1 TABLET EVERY DAY 90 tablet 3  . ascorbic acid (VITAMIN C) 500 MG tablet Take 500 mg by mouth  daily.    [name] aspirin EC 81 MG tablet Take 1 tablet (81 mg total) by mouth daily with breakfast. 120 tablet 2  . azithromycin (ZITHROMAX) 250 MG tablet Take two tablets by mouth on day one , then one tablet once daily for an additional four days (Patient not taking: Reported on 11/06/2019) 6 tablet 0  . Blood Glucose Calibration (ACCU-CHEK GUIDE CONTROL) LIQD USE PRN TO CALIBRATE GLUCOMETER 3 each 11  . blood glucose meter kit and supplies Dispense based on patient and insurance preference. Use up to four times daily as directed. (FOR ICD-10 E10.9, E11.9). 1 each 0  . buPROPion (WELLBUTRIN XL) 150 MG 24 hr tablet Take 1 tablet (150 mg total) by mouth every morning. 90 tablet 2  . cetirizine (ZYRTEC) 10 MG tablet Take 1 tablet (10 mg total) by mouth daily. 7 tablet 0  . chlorhexidine (PERIDEX) 0.12 % solution Use as directed 15 mLs in the mouth or throat 2 (two) times daily. 473 mL 0  . clobetasol cream (TEMOVATE) 7.82 % Apply 1 application topically 2 (two) times daily.     . Continuous Blood Gluc Sensor (FREESTYLE LIBRE 14  DAY SENSOR) MISC 1 each by Does not apply route every 14 (fourteen) days. Change every 2 weeks (Patient not taking: Reported on 11/06/2019) 2 each 11  . cromolyn (NASALCROM) 5.2 MG/ACT nasal spray Place 1 spray into both nostrils 4 (four) times daily. 13 mL 1  . diclofenac sodium (VOLTAREN) 1 % GEL APPLY 2 GRAMS  TOPICALLY 4 (FOUR) TIMES DAILY. 200 g 1  . diclofenac Sodium (VOLTAREN) 1 % GEL APPLY 2 GRAMS  TOPICALLY 4 (FOUR) TIMES DAILY. 800 g 0  . dicyclomine (BENTYL) 10 MG capsule Take 1 capsule (10 mg total) by mouth 3 (three) times daily as needed for spasms. 90 capsule 2  . ezetimibe (ZETIA) 10 MG tablet Take 1 tablet (10 mg total) by mouth daily. 90 tablet 1  . glucose blood (ACCU-CHEK GUIDE) test strip USE TO CHECK BLOOD SUGAR FOUR TIMES A DAY AND PRN 400 each 11  . hydroxychloroquine (PLAQUENIL) 200 MG tablet Take 200 mg by mouth 2 (two) times daily.    . hydrOXYzine  (ATARAX/VISTARIL) 25 MG tablet Take 1 tablet (25 mg total) by mouth daily as needed for itching. 15 tablet 0  . insulin aspart (NOVOLOG FLEXPEN) 100 UNIT/ML FlexPen INJECT 10 TO 14 UNITS INTO THE SKIN 3 (THREE) TIMES DAILY WITH MEALS. 45 mL 3  . Insulin Glargine, 2 Unit Dial, (TOUJEO MAX SOLOSTAR) 300 UNIT/ML SOPN Inject 25 Units into the skin at bedtime. 9 pen 3  . lamoTRIgine (LAMICTAL) 100 MG tablet Take 1 tablet (100 mg total) by mouth 2 (two) times daily. 180 tablet 2  . Lancets (ACCU-CHEK SOFT TOUCH) lancets Use as instructed to check blood sugar 3 times a day. 100 each 12  . levothyroxine (SYNTHROID) 50 MCG tablet TAKE 1 TABLET DAILY, EXCEPT TAKE 1/2 TABLET ON SUNDAY 85 tablet 1  . lidocaine (XYLOCAINE) 2 % solution RINSE WITH 15MLS AND SPIT OUT AFTER 30 SECONDS FOUR TIMES DAILY AS NEEDED FOR PAIN.    [name] linaclotide (LINZESS) 72 MCG capsule Take 1 capsule (72 mcg total) by mouth daily before breakfast. Pharmacy-please d/c linzess 145 mcg dosage 30 capsule 2  . LORazepam (ATIVAN) 0.5 MG tablet Take 1 tablet (0.5 mg total) by mouth 3 (three) times daily. 270 tablet 2  . losartan (COZAAR) 50 MG tablet TAKE 1 TABLET EVERY DAY 90 tablet 1  . lubiprostone (AMITIZA) 8 MCG capsule Take 1 capsule (8 mcg total) by mouth 2 (two) times daily with a meal. Pharmacy- d/c script for amitiza 24 mcg 180 capsule 0  . metaxalone (SKELAXIN) 800 MG tablet Take 800 mg by mouth 3 (three) times daily.    . metoprolol tartrate (LOPRESSOR) 50 MG tablet TAKE 1 TABLET TWICE DAILY (NEED MD APPOINTMENT) 180 tablet 3  . montelukast (SINGULAIR) 10 MG tablet TAKE 1 TABLET EVERY DAY 90 tablet 1  . NEOMYCIN-POLYMYXIN-HC, OPHTH, SUSP neomycin 3.5 mg-polymyxin 10,000 unit-hydrocort 10 mg/mL eye drop,susp    . nicotine (NICOTROL) 10 MG inhaler Inhale 1 Cartridge (1 continuous puffing total) into the lungs as needed for smoking cessation. 42 each 3  . nystatin (MYCOSTATIN/NYSTOP) powder APPLY TO AFFECTED AREA 4 TIMES DAILY. 90 g 1   . Omega-3 Fatty Acids (FISH OIL PO) Take 1 capsule by mouth daily.    . ondansetron (ZOFRAN) 4 MG tablet Take 1 tablet (4 mg total) by mouth every 8 (eight) hours as needed for nausea or vomiting. 20 tablet 0  . oxyCODONE-acetaminophen (PERCOCET) 10-325 MG tablet Take 1 tablet by mouth every 8 (eight) hours  as needed.     . pregabalin (LYRICA) 75 MG capsule Take 1 capsule (75 mg total) by mouth daily.    . RABEprazole (ACIPHEX) 20 MG tablet Take 1 tablet (20 mg total) by mouth 2 (two) times daily. MUST HAVE OFFICE VISIT FOR FURTHER REFILLS 180 tablet 0  . RESTASIS 0.05 % ophthalmic emulsion Place 1 drop into both eyes 2 (two) times daily.     . risperiDONE (RISPERDAL) 0.5 MG tablet Take 1 tablet (0.5 mg total) by mouth at bedtime. 90 tablet 2  . rosuvastatin (CRESTOR) 5 MG tablet TAKE 1 TABLET AT BEDTIME 90 tablet 2  . sertraline (ZOLOFT) 100 MG tablet Take 1 tablet (100 mg total) by mouth daily. 90 tablet 2  . SHINGRIX injection     . STIOLTO RESPIMAT 2.5-2.5 MCG/ACT AERS INHALE 2 PUFFS INTO THE LUNGS DAILY. 12 g 3  . traMADol (ULTRAM) 50 MG tablet Take 50 mg by mouth every 6 (six) hours as needed.    . traZODone (DESYREL) 100 MG tablet Take 1 tablet (100 mg total) by mouth at bedtime. 90 tablet 2  . UNABLE TO [facility] bed mattress x 1  DX: G47.33, J44.9 1 each 0   No current facility-administered medications for this visit.     Musculoskeletal: Strength & Muscle Tone: decreased Gait & Station: normal Patient leans: N/A  Psychiatric Specialty Exam: Review of Systems  Musculoskeletal: Positive for arthralgias, back pain, myalgias and neck pain.  Psychiatric/Behavioral: Positive for dysphoric mood.  All other systems reviewed and are negative.   There were no vitals taken for this visit.There is no height or weight on file to calculate BMI.  General Appearance: NA  Eye Contact:  NA  Speech:  Clear and Coherent  Volume:  Normal  Mood:  Dysphoric  Affect:  NA  Thought  Process:  Goal Directed  Orientation:  Full (Time, Place, and Person)  Thought Content: Rumination   Suicidal Thoughts:  No  Homicidal Thoughts:  No  Memory:  Immediate;   Good Recent;   Fair Remote;   Fair  Judgement:  Fair  Insight:  Fair  Psychomotor Activity:  Decreased  Concentration:  Concentration: Good and Attention Span: Good  Recall:  Good  Fund of Knowledge: Good  Language: Good  Akathisia:  No  Handed:  Right  AIMS (if indicated): not done  Assets:  Communication Skills Desire for Improvement Resilience Social Support Talents/Skills  ADL's:  Intact  Cognition: WNL  Sleep:  Fair   Screenings: Mini-Mental     Office Visit from 02/03/2019 in [facility] Office Visit from 09/08/2015 in [facility] [place] from 07/02/2014 in [facility] [place] from 12/08/2013 in [facility]  Total Score (max 30 points ) 27 24 26 27     PHQ2-9     Office Visit from 10/09/2019 in [facility] Patient Outreach Telephone from 09/11/2019 in [facility] Visit from 08/20/2019 in [facility] Visit from 07/07/2019 in [facility] Patient Outreach Telephone from 05/21/2019 in [facility]  PHQ-2 Total Score 2 1 1 2 2   PHQ-9 Total Score 4 -- -- 13 5       Assessment and Plan: This patient is a 69 year old female with a history of depression and anxiety.  She seems to drool a lot in the past and I have urged her not to do so.  For the most part however she is stable.  She will  continue Wellbutrin XL 150 mg daily for depression and smoking cessation as well as Zoloft 100 mg daily for depression.  She will continue trazodone 100 mg at bedtime for sleep Ativan 0.5 mg 3 times daily for anxiety and Risperdal 0.5 mg at bedtime for mood stabilization as well as Lamictal 100 mg twice daily for mood stabilization.  She will return to see me in 3 months   [name] Spiller,  MD 11/14/2019, 11:35 AM

## 2019-11-16 ENCOUNTER — Other Ambulatory Visit: Payer: Self-pay | Admitting: Family Medicine

## 2019-11-18 ENCOUNTER — Telehealth: Payer: Self-pay

## 2019-11-18 NOTE — Telephone Encounter (Signed)
Patient left a voicemail on Monday 9/6 that she had fallen again and wanted to report it. I called her back and left a message to call back

## 2019-11-18 NOTE — Telephone Encounter (Signed)
Fell yesterday and hit her ribs on the left side. Rates pain at 7. Offered appt and she said there is nothing dr Moshe Cipro can do and she will call us back if she needs to be seen. Just wanted to report it

## 2019-11-19 ENCOUNTER — Ambulatory Visit: Payer: Medicare HMO | Admitting: Internal Medicine

## 2019-11-19 ENCOUNTER — Other Ambulatory Visit: Payer: Self-pay

## 2019-11-19 NOTE — Patient Outreach (Signed)
[facility]) Care Management  11/19/2019  Jacinda Kanady [place]and 1950/11/23 [id number]   Telephone call to patient for disease management follow up.   No answer.  HIPAA compliant voice message left.    Plan: If no return call, RN CM will attempt patient again in the month of November.   Jone Baseman, RN, MSN [place] Management Care Management Coordinator Direct Line [phone] Cell [phone] Toll Free: [phone]  Fax: [phone]

## 2019-11-21 ENCOUNTER — Telehealth: Payer: Self-pay

## 2019-11-21 NOTE — Telephone Encounter (Signed)
Spoke with [facility] and asked if they could do an in home covid test and they said no. Advised patient that if she felt worse from hitting her head over the weekend, she needs to call 911 to be evaluated. With verbal understanding.

## 2019-11-21 NOTE — Telephone Encounter (Signed)
Please call home health to see if they will come out to provide covid test, as pts aid has advised that she had covid last time she saw aid was Thursday.

## 2019-11-23 ENCOUNTER — Emergency Department (HOSPITAL_COMMUNITY)
Admission: EM | Admit: 2019-11-23 | Discharge: 2019-11-23 | Disposition: A | Discharge status: Home / Self Care | Payer: Medicare HMO | Attending: Emergency Medicine | Admitting: Emergency Medicine

## 2019-11-23 ENCOUNTER — Emergency Department (HOSPITAL_COMMUNITY): Payer: Medicare HMO

## 2019-11-23 ENCOUNTER — Other Ambulatory Visit: Payer: Self-pay

## 2019-11-23 ENCOUNTER — Encounter (HOSPITAL_COMMUNITY): Payer: Self-pay | Admitting: Emergency Medicine

## 2019-11-23 DIAGNOSIS — Y9289 Other specified places as the place of occurrence of the external cause: Secondary | ICD-10-CM | POA: Diagnosis not present

## 2019-11-23 DIAGNOSIS — Z794 Long term (current) use of insulin: Secondary | ICD-10-CM | POA: Insufficient documentation

## 2019-11-23 DIAGNOSIS — S0083XA Contusion of other part of head, initial encounter: Secondary | ICD-10-CM | POA: Insufficient documentation

## 2019-11-23 DIAGNOSIS — I10 Essential (primary) hypertension: Secondary | ICD-10-CM | POA: Diagnosis not present

## 2019-11-23 DIAGNOSIS — Z79899 Other long term (current) drug therapy: Secondary | ICD-10-CM | POA: Diagnosis not present

## 2019-11-23 DIAGNOSIS — E039 Hypothyroidism, unspecified: Secondary | ICD-10-CM | POA: Diagnosis not present

## 2019-11-23 DIAGNOSIS — Z7951 Long term (current) use of inhaled steroids: Secondary | ICD-10-CM | POA: Diagnosis not present

## 2019-11-23 DIAGNOSIS — R519 Headache, unspecified: Secondary | ICD-10-CM | POA: Diagnosis not present

## 2019-11-23 DIAGNOSIS — F17210 Nicotine dependence, cigarettes, uncomplicated: Secondary | ICD-10-CM | POA: Diagnosis not present

## 2019-11-23 DIAGNOSIS — S0990XA Unspecified injury of head, initial encounter: Secondary | ICD-10-CM | POA: Diagnosis present

## 2019-11-23 DIAGNOSIS — Y9389 Activity, other specified: Secondary | ICD-10-CM | POA: Insufficient documentation

## 2019-11-23 DIAGNOSIS — Z20822 Contact with and (suspected) exposure to covid-19: Secondary | ICD-10-CM | POA: Diagnosis not present

## 2019-11-23 DIAGNOSIS — E1143 Type 2 diabetes mellitus with diabetic autonomic (poly)neuropathy: Secondary | ICD-10-CM | POA: Insufficient documentation

## 2019-11-23 DIAGNOSIS — X58XXXA Exposure to other specified factors, initial encounter: Secondary | ICD-10-CM | POA: Diagnosis not present

## 2019-11-23 DIAGNOSIS — C069 Malignant neoplasm of mouth, unspecified: Secondary | ICD-10-CM | POA: Diagnosis not present

## 2019-11-23 DIAGNOSIS — E1142 Type 2 diabetes mellitus with diabetic polyneuropathy: Secondary | ICD-10-CM | POA: Diagnosis not present

## 2019-11-23 DIAGNOSIS — Y999 Unspecified external cause status: Secondary | ICD-10-CM | POA: Insufficient documentation

## 2019-11-23 DIAGNOSIS — J449 Chronic obstructive pulmonary disease, unspecified: Secondary | ICD-10-CM | POA: Insufficient documentation

## 2019-11-23 NOTE — ED Notes (Signed)
Pt c/o running into wall Friday and hit her head.  States she has had a headache since injury.  Also c/o leg weakness.

## 2019-11-23 NOTE — ED Provider Notes (Signed)
[facility] EMERGENCY DEPARTMENT Provider Note   CSN: [id number] Arrival date & time: 11/23/19  1037     History Chief Complaint  Patient presents with  . Head Injury    [name] is a 69 y.o. female.  The history is provided by the patient. No language interpreter was used.  Head Injury Location:  Frontal Time since incident:  2 days Mechanism of injury: direct blow   Pain details:    Quality:  Aching   Severity:  Mild   Timing:  Constant   Progression:  Worsening Chronicity:  New Worsened by:  Nothing Ineffective treatments:  None tried Associated symptoms: no nausea and no vomiting   Pt reports she hit her head 2 days ago.  Pt reports headache since hitting.  Pt has a history of a meningoma that was resected.  Pt also concerned that she was exposed to covid.  Pt reports having a headache     Past Medical History:  Diagnosis Date  . Allergy   . Anemia   . Anxiety    takes Ativan daily  . Arthritis   . Assistance needed for mobility   . Bipolar disorder ([place])    takes Risperdal nightly  . Blood transfusion   . Cancer ([place])    In her gum  . Carpal tunnel syndrome of right wrist 05/23/2011  . Cervical disc disorder with radiculopathy of cervical region 10/31/2012  . Chronic back pain   . Chronic idiopathic constipation   . Chronic neck and back pain   . Colon polyps   . COPD (chronic obstructive pulmonary disease) with chronic bronchitis ([place]) 09/16/2013   Office Spirometry 10/30/2013-submaximal effort based on appearance of loop and curve. Numbers would fit with severe restriction but her physiologic capability may be better than this. FVC 0.91/44%, and 10.74/45%, FEV1/FVC 0.81, FEF 25-75% 1.43/69%    . Diabetes mellitus    Type II  . Diverticulosis    TCS 9/08 by Dr. Delfin Edis for diarrhea . Bx for micro scopic colitis negative.   . Fibromyalgia   . Frequent falls   . GERD (gastroesophageal reflux disease)    takes Aciphex daily  . Glaucoma    eye  drops daily  . Gum symptoms    infection on antibiotic  . Hemiplegia affecting non-dominant side, post-stroke 08/02/2011  . Hiatal hernia   . Hyperlipidemia    takes Crestor daily  . Hypertension    takes Amlodipine,Metoprolol,and Clonidine daily  . Hypothyroidism    takes Synthroid daily  . IBS (irritable bowel syndrome)   . Insomnia    takes Trazodone nightly  . Major depression, recurrent ([place])    takes Zoloft daily  . 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 Metabolic encephalopathy 0/62/6948  . Migraines    chronic headaches  . Mononeuritis lower limb   . Narcolepsy   . Osteoporosis   . Pancreatitis 2006   due to Depakote with normal EUS   . Schatzki's ring    non critical / EGD with ED 8/2011with RMR  . Seizures ([place])    takes Lamictal daily.Last seizure 3 yrs ago  . Sleep apnea    on CPAP  . Small bowel obstruction ([place])   . Stroke [facility])    left sided weakness, speech changes  . Tubular adenoma of colon     Patient Active Problem List   Diagnosis Date Noted  . Tubular adenoma of colon 02/09/2019  . Benign neoplasm of cerebral meninges ([place]) 11/12/2018  .  Benign meningioma of brain ([place]) 10/31/2018  . Osteoarthritis of both hips 07/06/2018  . Left arm pain 06/11/2018  . Maxillary sinusitis, acute 04/17/2018  . Lipoma of extremity 12/31/2017  . Impingement syndrome of left shoulder region 12/27/2017  . Leg weakness, bilateral 10/28/2017  . Lumbar spondylosis with myelopathy 10/12/2017  . Numbness of hand 10/10/2017  . Chronic neck pain 07/25/2017  . Chronic pain syndrome 07/25/2017  . At risk for cardiovascular event 06/10/2017  . Rash 04/25/2017  . Diabetic polyneuropathy associated with type 2 diabetes mellitus ([place]) 05/19/2016  . Tobacco user 04/24/2016  . Obesity (BMI 30.0-34.9) 04/24/2016  . History of palpitations 08/09/2015  . Nausea 08/09/2015  . Labile hypertension 08/03/2015  . Normal coronary arteries 08/03/2015  . Left-sided low back pain with  left-sided sciatica 06/27/2015  . Multinodular goiter 05/06/2015  . Rectocele, female 04/27/2015  . Anal sphincter incontinence 04/27/2015  . Pelvic relaxation due to rectocele 03/30/2015  . Pulmonary hypertension ([place]) 02/22/2015  . Light cigarette smoker (1-9 cigarettes per day) 01/11/2015  . Migraine without aura and without status migrainosus, not intractable 07/02/2014  . Flatulence 02/18/2014  . Microcytic anemia 02/18/2014  . COPD mixed type ([place]) 09/16/2013  . Hypothyroidism 08/16/2013  . Gastroparesis 04/28/2013  . Seizure disorder ([place]) 01/19/2013  . Cervical disc disorder with radiculopathy of cervical region 10/31/2012  . Solitary pulmonary nodule 08/19/2012  . Anemia 07/05/2012  . 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 Meningioma ([place]) 11/19/2011  . 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 Carpal tunnel syndrome of right wrist 05/23/2011  . Polypharmacy 04/28/2011  . Bipolar disorder ([place]) 04/28/2011  . Constipation 04/13/2011  . Recurrent falls 12/12/2010  . Oropharyngeal dysphagia 07/12/2010  . Urinary incontinence 12/16/2009  . HEARING LOSS 10/26/2009  . Well controlled type 2 diabetes mellitus with gastroparesis ([place]) 07/07/2009  . Hyperlipidemia 12/11/2008  . IBS 12/11/2008  . GERD 07/29/2008  . MILK PRODUCTS ALLERGY 07/29/2008  . 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 Essential hypertension 06/27/2007  . Backache 06/19/2007  . Osteoporosis 06/19/2007  . Obstructive sleep apnea 06/19/2007  . TRIGGER FINGER 04/18/2007  . DIVERTICULOSIS, COLON 11/13/2006    Past Surgical History:  Procedure Laterality Date  . ABDOMINAL HYSTERECTOMY  1978  . BACK SURGERY  July 2012  . BACTERIAL OVERGROWTH TEST N/A 05/05/2013   Procedure: BACTERIAL OVERGROWTH TEST;  Surgeon: Daneil Dolin, MD;  Location: [facility];  Service: Endoscopy;  Laterality: N/A;  7:30  . BIOPSY THYROID  2009  . BRAIN SURGERY  11/2011    resection of meningioma  . BREAST REDUCTION SURGERY  1994  . CARDIAC CATHETERIZATION  05/10/2005   normal coronaries, normal LV systolic function and EF (Dr. Jackie Plum)  . CARPAL TUNNEL RELEASE Left 07/22/04   Dr. Aline Brochure  . CATARACT EXTRACTION Bilateral   . CHOLECYSTECTOMY  1984  . COLONOSCOPY N/A 09/25/2012   [id number]:[id number] diverticulosis.  colonic polyp-removed : tubular adenoma  . CRANIOTOMY  11/23/2011   Procedure: CRANIOTOMY TUMOR EXCISION;  Surgeon: Hosie Spangle, MD;  Location: [place] NEURO ORS;  Service: Neurosurgery;  Laterality: N/A;  Craniotomy for tumor resection  . ESOPHAGOGASTRODUODENOSCOPY  12/29/2010   Rourk-Retained food in the esophagus and stomach, small hiatal hernia, status post Maloney dilation of the esophagus  . ESOPHAGOGASTRODUODENOSCOPY N/A 09/25/2012   [id number] atonic baggy esophagus status post Maloney dilation 80 F. Hiatal hernia  . GIVENS CAPSULE STUDY N/A 01/15/2013   NORMAL.   [name]  IR GENERIC HISTORICAL  03/17/2016   IR RADIOLOGIST EVAL & MGMT 03/17/2016 [facility]-INTERV RAD  . LESION REMOVAL N/A 05/31/2015   Procedure: REMOVAL RIGHT AND LEFT LESIONS OF MANDIBLE;  Surgeon: Diona Browner, DDS;  Location: [place];  Service: Oral Surgery;  Laterality: N/A;  . MALONEY DILATION  12/29/2010   RMR;  . NM MYOCAR PERF WALL MOTION  2006   "relavtiely normal" persantine, mild anterior thinning (breast attenuation artifact), no region of scar/ischemia  . OVARIAN CYST REMOVAL    . RECTOCELE REPAIR N/A 06/29/2015   Procedure: POSTERIOR REPAIR (RECTOCELE);  Surgeon: Jonnie Kind, MD;  Location: [facility]S;  Service: Gynecology;  Laterality: N/A;  . REDUCTION MAMMAPLASTY Bilateral   . SPINE SURGERY  09/29/2010   Dr. Rolena Infante  . surgical excision of 3 tumors from right thigh and right buttock  and left upper thigh  2010  . TOOTH EXTRACTION Bilateral 12/14/2014   Procedure: REMOVAL OF BILATERAL MANDIBULAR EXOSTOSES;  Surgeon: Diona Browner, DDS;  Location: [place];  Service: Oral Surgery;   Laterality: Bilateral;  . TRANSTHORACIC ECHOCARDIOGRAM  2010   EF 60-65%, mild conc LVH, grade 1 diastolic dysfunction; mildly calcified MV annulus with mildly thickened leaflets, mildly calcified MR annulus     OB History    Gravida  6   Para  1   Term  1   Preterm      AB  5   Living        SAB  5   TAB      Ectopic      Multiple      Live Births              Family History  Problem Relation Age of Onset  . Heart attack Mother        HTN  . Pneumonia Father   . Kidney failure Father   . Diabetes Father   . Pancreatic cancer Sister   . Diabetes Brother   . Hypertension Brother   . Diabetes Brother   . Cancer Sister        breast   . Hypertension Son   . Sleep apnea Son   . Cancer Sister        pancreatic  . Stroke Maternal Grandmother   . Heart attack Maternal Grandfather   . Alcohol abuse Maternal Uncle   . Colon cancer Neg Hx   . Anesthesia problems Neg Hx   . Hypotension Neg Hx   . Malignant hyperthermia Neg Hx   . Pseudochol deficiency Neg Hx   . Breast cancer Neg Hx     Social History   Tobacco Use  . Smoking status: Current Every Day Smoker    Packs/day: 0.50    Years: 30.00    Pack years: 15.00    Types: Cigarettes  . Smokeless tobacco: Never Used  . Tobacco comment: 5-10 cigs a day  Vaping Use  . Vaping Use: Never used  Substance Use Topics  . Alcohol use: No    Alcohol/week: 0.0 standard drinks    Comment:    . Drug use: No    Home Medications Prior to Admission medications   Medication Sig Start Date End Date Taking? Authorizing Provider  albuterol (VENTOLIN HFA) 108 (90 Base) MCG/ACT inhaler INHALE 1 TO 2 PUFFS EVERY 6 HOURS AS NEEDED FOR WHEEZING, SHORTNESS OF BREATH 11/14/19   Baird Lyons D, MD  Alcohol Swabs (B-D SINGLE USE SWABS REGULAR) PADS USE TO CLEAN FINGER PRIOR  TO STICKING FOR BLOOD SUGAR. 08/12/19   Philemon Kingdom, MD  alendronate (FOSAMAX) 70 MG tablet Take 1 tablet (70 mg total) by mouth every 7 (seven)  days. Take with a full glass of water on an empty stomach. 03/18/19   [name], MD  amLODipine (NORVASC) 10 MG tablet TAKE 1 TABLET EVERY DAY 03/28/19   Ahmed Prima, [place] M, PA-C  ascorbic acid (VITAMIN C) 500 MG tablet Take 500 mg by mouth daily.    [provider]  aspirin EC 81 MG tablet Take 1 tablet (81 mg total) by mouth daily with breakfast. 12/08/18   Roxan Hockey, MD  azithromycin (ZITHROMAX) 250 MG tablet Take two tablets by mouth on day one , then one tablet once daily for an additional four days Patient not taking: Reported on 11/06/2019 10/09/19   [name], MD  Blood Glucose Calibration (ACCU-CHEK GUIDE CONTROL) LIQD USE PRN TO CALIBRATE GLUCOMETER 08/12/19   Philemon Kingdom, MD  blood glucose meter kit and supplies Dispense based on patient and insurance preference. Use up to four times daily as directed. (FOR ICD-10 E10.9, E11.9). 06/27/19   Philemon Kingdom, MD  buPROPion (WELLBUTRIN XL) 150 MG 24 hr tablet Take 1 tablet (150 mg total) by mouth every morning. 11/14/19 11/13/20  Cloria Spring, MD  cetirizine (ZYRTEC) 10 MG tablet Take 1 tablet (10 mg total) by mouth daily. 07/30/17   [name], MD  chlorhexidine (PERIDEX) 0.12 % solution Use as directed 15 mLs in the mouth or throat 2 (two) times daily. 07/16/19   [place]urst, [place], PA-C  clobetasol cream (TEMOVATE) 9.50 % Apply 1 application topically 2 (two) times daily.  07/30/18   [provider]  Continuous Blood Gluc Sensor (FREESTYLE LIBRE 14 DAY SENSOR) MISC 1 each by Does not apply route every 14 (fourteen) days. Change every 2 weeks Patient not taking: Reported on 11/06/2019 06/24/19   Philemon Kingdom, MD  cromolyn (NASALCROM) 5.2 MG/ACT nasal spray Place 1 spray into both nostrils 4 (four) times daily. 01/17/19   Perlie Mayo, NP  diclofenac sodium (VOLTAREN) 1 % GEL APPLY 2 GRAMS  TOPICALLY 4 (FOUR) TIMES DAILY. 01/06/19 01/06/20  Esterwood, Amy S, PA-C  diclofenac Sodium (VOLTAREN)  1 % GEL APPLY 2 GRAMS  TOPICALLY 4 (FOUR) TIMES DAILY. 06/19/19   Esterwood, Amy S, PA-C  dicyclomine (BENTYL) 10 MG capsule Take 1 capsule (10 mg total) by mouth 3 (three) times daily as needed for spasms. 07/02/19   Pyrtle, Lajuan Lines, MD  ezetimibe (ZETIA) 10 MG tablet Take 1 tablet (10 mg total) by mouth daily. 08/08/19   [name], MD  glucose blood (ACCU-CHEK GUIDE) test strip USE TO CHECK BLOOD SUGAR FOUR TIMES A DAY AND PRN 08/12/19   Philemon Kingdom, MD  hydroxychloroquine (PLAQUENIL) 200 MG tablet Take 200 mg by mouth 2 (two) times daily. 07/09/19   [provider]  hydrOXYzine (ATARAX/VISTARIL) 25 MG tablet Take 1 tablet (25 mg total) by mouth daily as needed for itching. 07/04/19   Perlie Mayo, NP  insulin aspart (NOVOLOG FLEXPEN) 100 UNIT/ML FlexPen INJECT 10 TO 14 UNITS INTO THE SKIN 3 (THREE) TIMES DAILY WITH MEALS. 02/17/19   Philemon Kingdom, MD  Insulin Glargine, 2 Unit Dial, (TOUJEO MAX SOLOSTAR) 300 UNIT/ML SOPN Inject 25 Units into the skin at bedtime. 02/17/19   Philemon Kingdom, MD  lamoTRIgine (LAMICTAL) 100 MG tablet Take 1 tablet (100 mg total) by mouth 2 (two) times daily. 11/14/19   Harrington Challenger,  Su Ley, MD  Lancets (ACCU-CHEK SOFT TOUCH) lancets Use as instructed to check blood sugar 3 times a day. 03/11/19   Philemon Kingdom, MD  levothyroxine (SYNTHROID) 50 MCG tablet TAKE 1 TABLET DAILY, EXCEPT TAKE 1/2 TABLET ON SUNDAY 07/28/19   [name], MD  lidocaine (XYLOCAINE) 2 % solution RINSE WITH 15MLS AND SPIT OUT AFTER 30 SECONDS FOUR TIMES DAILY AS NEEDED FOR PAIN. 08/04/19   [provider]  linaclotide (LINZESS) 72 MCG capsule Take 1 capsule (72 mcg total) by mouth daily before breakfast. Pharmacy-please d/c linzess 145 mcg dosage 07/02/19   Pyrtle, Lajuan Lines, MD  LORazepam (ATIVAN) 0.5 MG tablet Take 1 tablet (0.5 mg total) by mouth 3 (three) times daily. 11/14/19 11/13/20  Cloria Spring, MD  losartan (COZAAR) 50 MG tablet TAKE 1 TABLET EVERY DAY 07/28/19    [name], MD  lubiprostone (AMITIZA) 8 MCG capsule Take 1 capsule (8 mcg total) by mouth 2 (two) times daily with a meal. Pharmacy- d/c script for amitiza 24 mcg 08/13/19   Pyrtle, Lajuan Lines, MD  metaxalone (SKELAXIN) 800 MG tablet Take 800 mg by mouth 3 (three) times daily. 07/09/19   [provider]  metoprolol tartrate (LOPRESSOR) 50 MG tablet TAKE 1 TABLET TWICE DAILY (NEED MD APPOINTMENT) 11/10/19   Ahmed Prima, Fransisco Hertz, PA-C  montelukast (SINGULAIR) 10 MG tablet TAKE 1 TABLET EVERY DAY 07/28/19   [name], MD  NEOMYCIN-POLYMYXIN-HC, OPHTH, SUSP neomycin 3.5 mg-polymyxin 10,000 unit-hydrocort 10 mg/mL eye drop,susp    [provider]  nicotine (NICOTROL) 10 MG inhaler Inhale 1 Cartridge (1 continuous puffing total) into the lungs as needed for smoking cessation. 10/09/19   [name], MD  nystatin (MYCOSTATIN/NYSTOP) powder APPLY TO AFFECTED AREA 4 TIMES DAILY. 10/09/19   [name], MD  Omega-3 Fatty Acids (FISH OIL PO) Take 1 capsule by mouth daily.    [provider]  ondansetron (ZOFRAN) 4 MG tablet Take 1 tablet (4 mg total) by mouth every 8 (eight) hours as needed for nausea or vomiting. 12/11/18   [name], MD  oxyCODONE-acetaminophen (PERCOCET) 10-325 MG tablet Take 1 tablet by mouth every 8 (eight) hours as needed.  08/06/18   [provider]  pregabalin (LYRICA) 75 MG capsule Take 1 capsule (75 mg total) by mouth daily. 10/20/16   Rexene Alberts, MD  RABEprazole (ACIPHEX) 20 MG tablet Take 1 tablet (20 mg total) by mouth 2 (two) times daily. MUST HAVE OFFICE VISIT FOR FURTHER REFILLS 10/01/19   Pyrtle, Lajuan Lines, MD  RESTASIS 0.05 % ophthalmic emulsion Place 1 drop into both eyes 2 (two) times daily.  02/04/14   [provider]  risperiDONE (RISPERDAL) 0.5 MG tablet Take 1 tablet (0.5 mg total) by mouth at bedtime. 11/14/19   Cloria Spring, MD  rosuvastatin (CRESTOR) 5 MG tablet TAKE 1 TABLET AT BEDTIME 11/18/19    [name], MD  sertraline (ZOLOFT) 100 MG tablet Take 1 tablet (100 mg total) by mouth daily. 11/14/19   Cloria Spring, MD  [facility] injection  02/12/19   [provider]  STIOLTO RESPIMAT 2.5-2.5 MCG/ACT AERS INHALE 2 PUFFS INTO THE LUNGS DAILY. 04/11/19   Deneise Lever, MD  traMADol (ULTRAM) 50 MG tablet Take 50 mg by mouth every 6 (six) hours as needed. 07/21/19   [provider]  traZODone (DESYREL) 100 MG tablet Take 1 tablet (100 mg total) by mouth at bedtime. 11/14/19   Levonne Spiller  R, MD  UNABLE TO [facility] bed mattress x 1  DX: G47.33, J44.9 10/16/19   [name], MD    Allergies    Cephalexin, Iron, Milk-related compounds, Penicillins, and Phenazopyridine hcl  Review of Systems   Review of Systems  Gastrointestinal: Negative for nausea and vomiting.  All other systems reviewed and are negative.   Physical Exam Updated Vital Signs BP (!) 197/75 (BP Location: Right Arm)   Pulse 90   Temp 99.3 F (37.4 C) (Oral)   Resp 18   Ht _0  (1.499 m)   Wt 77.6 kg   SpO2 96%   BMI 34.54 kg/m   Physical Exam Vitals and nursing note reviewed.  Constitutional:      Appearance: She is well-developed.  HENT:     Head: Normocephalic.  Cardiovascular:     Rate and Rhythm: Normal rate.  Pulmonary:     Effort: Pulmonary effort is normal.  Abdominal:     General: There is no distension.  Musculoskeletal:        General: Normal range of motion.     Cervical back: Normal range of motion.  Skin:    General: Skin is warm.  Neurological:     General: No focal deficit present.     Mental Status: She is alert and oriented to person, place, and time.  Psychiatric:        Mood and Affect: Mood normal.     ED Results / Procedures / Treatments   Labs (all labs ordered are listed, but only abnormal results are displayed) Labs Reviewed  SARS CORONAVIRUS 2 (TAT 6-24 HRS)    EKG None  Radiology CT Head Wo Contrast  Result Date:  11/23/2019 CLINICAL DATA:  Worsening headache after striking head on door 3 days prior EXAM: CT HEAD WITHOUT CONTRAST TECHNIQUE: Contiguous axial images were obtained from the base of the skull through the vertex without intravenous contrast. COMPARISON:  CT 11/01/2017, MRI 04/18/2019 FINDINGS: Brain: Redemonstration of the postsurgical changes of prior midline frontal craniotomy for tumor resection with a surgical clip and surrounding encephalomalacia in the bifrontal lobes. Known residual tumor along the planum sphenoidale is poorly assessed on this examination albeit with some increasing bony erosive changes when compared to prior CT imaging 11/01/2017, best seen on the sagittal reconstructions 11/06/2017. No evidence of acute infarction, hemorrhage, hydrocephalus, extra-axial collection or new mass lesion or resulting mass effect. Symmetric prominence of the ventricles, cisterns and sulci compatible with parenchymal volume loss. Patchy areas of white matter hypoattenuation are most compatible with chronic microvascular angiopathy. Vascular: Atherosclerotic calcification of the carotid siphons and intradural vertebral arteries. No hyperdense vessel. Skull: Postsurgical changes from midline frontal craniotomy. Mild left frontal/supraorbital scalp swelling without subjacent calvarial fracture. Erosions of the planum sphenoidale adjacent the known residual mass as above. Mild benign hyperostosis frontalis interna similar to comparison. Sinuses/Orbits: Paranasal sinuses and mastoid air cells are predominantly clear. Orbital structures are unremarkable aside from prior lens extractions. Other: None IMPRESSION: 1. Mild left frontal/supraorbital scalp swelling without subjacent calvarial fracture. 2. Redemonstration of the postsurgical changes of prior midline frontal craniotomy for meningioma resection with a surgical clip and surrounding encephalomalacia in the bifrontal lobes. Known residual tumor along the planum  sphenoidale is poorly assessed on this noncontrast head CT albeit with some increasing bony scalloping subjacent to the lesion when compared to prior CT imaging, not clearly present on MR imaging either. Consider further evaluation with contrast enhanced MRI to assess for interval growth/changed from  comparison study 04/18/2019. 3. No other acute or significant intracranial abnormality. Electronically Signed   By: Lovena Le M.D.   On: 11/23/2019 15:59    Procedures Procedures (including critical care time)  Medications Ordered in ED Medications - No data to display  ED Course  I have reviewed the triage vital signs and the nursing notes.  Pertinent labs & imaging results that were available during my care of the patient were reviewed by me and considered in my medical decision making (see chart for details).    MDM Rules/Calculators/A&P                          MDM:  I discussed Ct with radiologist  No acute injury, radiology advised further imagining of mengioma.  Covid ordered and is pending.   Final Clinical Impression(s) / ED Diagnoses Final diagnoses:  Contusion of other part of head, initial encounter  Hypertension, unspecified type    Rx / DC Orders ED Discharge Orders    None    An After Visit Summary was printed and given to the patient.   Fransico Meadow, Hershal Coria 11/23/19 1625    Dorie Rank, MD 11/25/19 [phone]

## 2019-11-23 NOTE — Discharge Instructions (Addendum)
Your covid test is pending 

## 2019-11-23 NOTE — ED Triage Notes (Signed)
Pt states she ran into a door either Thursday or Friday and hit head. Pt c/o HA and neck pain since incident. States that she has a brain tumor and c-spine issues. Denies LOC. Pt also states that her aide was exposed to [place], but she is unsure if her aid has tested positive. Pt denies any COVID symptoms at this time.

## 2019-11-24 ENCOUNTER — Telehealth: Payer: Self-pay

## 2019-11-24 DIAGNOSIS — J42 Unspecified chronic bronchitis: Secondary | ICD-10-CM | POA: Diagnosis not present

## 2019-11-24 DIAGNOSIS — E039 Hypothyroidism, unspecified: Secondary | ICD-10-CM | POA: Diagnosis not present

## 2019-11-24 DIAGNOSIS — E1142 Type 2 diabetes mellitus with diabetic polyneuropathy: Secondary | ICD-10-CM | POA: Diagnosis not present

## 2019-11-24 DIAGNOSIS — D649 Anemia, unspecified: Secondary | ICD-10-CM | POA: Diagnosis not present

## 2019-11-24 DIAGNOSIS — J449 Chronic obstructive pulmonary disease, unspecified: Secondary | ICD-10-CM | POA: Diagnosis not present

## 2019-11-24 DIAGNOSIS — F17210 Nicotine dependence, cigarettes, uncomplicated: Secondary | ICD-10-CM | POA: Diagnosis not present

## 2019-11-24 DIAGNOSIS — F250 Schizoaffective disorder, bipolar type: Secondary | ICD-10-CM | POA: Diagnosis not present

## 2019-11-24 DIAGNOSIS — M160 Bilateral primary osteoarthritis of hip: Secondary | ICD-10-CM | POA: Diagnosis not present

## 2019-11-24 DIAGNOSIS — E1165 Type 2 diabetes mellitus with hyperglycemia: Secondary | ICD-10-CM | POA: Diagnosis not present

## 2019-11-24 LAB — SARS CORONAVIRUS 2 (TAT 6-24 HRS): SARS Coronavirus 2: NEGATIVE

## 2019-11-24 NOTE — Telephone Encounter (Signed)
Pt requesting a call back regarding her hospital stay yesterday. She said the hospital wants her to see Dr Rita Ohara for [facility]. She wants to know does she need to see Moshe Cipro first?

## 2019-11-25 ENCOUNTER — Other Ambulatory Visit: Payer: Self-pay

## 2019-11-25 ENCOUNTER — Ambulatory Visit (INDEPENDENT_AMBULATORY_CARE_PROVIDER_SITE_OTHER): Payer: Medicare HMO | Admitting: Psychiatry

## 2019-11-25 ENCOUNTER — Telehealth (HOSPITAL_COMMUNITY): Payer: Self-pay | Admitting: Psychiatry

## 2019-11-25 DIAGNOSIS — F331 Major depressive disorder, recurrent, moderate: Secondary | ICD-10-CM

## 2019-11-25 NOTE — Telephone Encounter (Signed)
Spoke to patient and she will contact [place] for follow up per er recommendation

## 2019-11-25 NOTE — Progress Notes (Signed)
Virtual Visit via Telephone Note  I connected with [name] on 11/25/19 at 4:25 PM EDT by telephone and verified that I am speaking with the correct person using two identifiers.   I discussed the limitations, risks, security and privacy concerns of performing an evaluation and management service by telephone and the availability of in person appointments. I also discussed with the patient that there may be a patient responsible charge related to this service. The patient expressed understanding and agreed to proceed.  I provided 45 minutes of non-face-to-face time during this encounter.   [name] Smoker, LCSW                        THERAPIST PROGRESS NOTE  Location: Patient-home [name] - [place] office  Session Time: Tuesday 11/25/2019 4:25 PM - 5:10 PM          Participation Level: Active  Behavioral Response:  depressed, anxious  Type of Therapy: Individual Therapy  Treatment Goals addressed: Patient wants to learn how to improve coping skills to manage chronic pain and health issues/ improve mood    Interventions: CBT and Supportive   Summary: [name] is a 69 y.o. female who presents with  long standing history of recurrent periods  of depression beginning when she was [age] and her favorite uncle died. Patient reports multiple psychiatric hospitlaizations due to depression and suicidal ideations with the last one occuring in 1997. Patient has participated in outpatient psychotherapy and medication management intermittently since age 52. She currently is seeing psychiatrist Dr. Harrington Challenger . Prior to this, she was seen at [facility]. Patient also has had ECT at [facility]. Symptoms have worsened in recent months due to family stress and have  included depressed mood, anxiety, excessive worry, and tearfulness.         Patient's last contact was by virtual visit via telephone about 2-3  weeks ago. She continues to experience  moderate symptoms of depression.   She reports increased stress due to suffering a contusion after she bumped her head on a door.  This resulted in patient going to the ED and having a CT scan.  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  She also reports stress related to exposure to Covid-19 as her aide tested positive.  Patient's Covid test came back negative yesterday per her report.  However, patient reports having no assistance at her home for the past several days as a result of issues related to Covid and Covid testing.  She says her home health agency said they could send someone else to her home once her Covid test came back negative.  Patient also expresses frustration regarding son's response and negative interaction to her requests to him for help with transportation to ED and help at her home. She reports she did receive support from her pastor's wife.  Patient expresses frustration she has been unable to attend church.  She reports additional stress related to health issues of 2 of her friends and the recent deaths of 2 family members.    Suicidal/Homicidal: Nowithout intent/plan  Therapist Response: Reviewed symptoms, discussed stressors, facilitated expression of thoughts and feelings, validated feelings, reviewed ways to use spirituality to develop coping statements, also discussed ways patient can continue to nurture spirituality including listening to sermons on tape/talking with her pastor's wife/prayer, also assisted patient identify  ways she can support a reach out to friends and family   Plan: Return again in 2 weeks.  Diagnosis: Axis I: MDD, Recurrent, moderate    Axis II: Deferred    [name] [name] Zulema [place], LCSW

## 2019-11-25 NOTE — Telephone Encounter (Signed)
Therapist attempted to contact patient via phone for scheduled appointment and received voicemail message.  Therapist left message indicating attempt and requesting patient call office. 

## 2019-11-26 ENCOUNTER — Ambulatory Visit: Payer: Medicare HMO

## 2019-11-28 ENCOUNTER — Other Ambulatory Visit: Payer: Self-pay | Admitting: Internal Medicine

## 2019-12-01 ENCOUNTER — Telehealth: Payer: Self-pay

## 2019-12-01 DIAGNOSIS — M75102 Unspecified rotator cuff tear or rupture of left shoulder, not specified as traumatic: Secondary | ICD-10-CM | POA: Diagnosis not present

## 2019-12-01 DIAGNOSIS — M5030 Other cervical disc degeneration, unspecified cervical region: Secondary | ICD-10-CM | POA: Diagnosis not present

## 2019-12-01 DIAGNOSIS — Z6834 Body mass index (BMI) 34.0-34.9, adult: Secondary | ICD-10-CM | POA: Diagnosis not present

## 2019-12-01 DIAGNOSIS — D329 Benign neoplasm of meninges, unspecified: Secondary | ICD-10-CM | POA: Diagnosis not present

## 2019-12-01 NOTE — Telephone Encounter (Signed)
Pt stated that [name] has sent her a bill for the equipment that she is using for health needs per [name], I asked the pt if there was a customer service phone number that she could call the company to ask for assistance.

## 2019-12-03 ENCOUNTER — Telehealth: Payer: Self-pay | Admitting: Radiology

## 2019-12-03 NOTE — Telephone Encounter (Signed)
Patient states she was going to have bloodwork and had neurosurgery appointment instead I advised her she can go anytime except for during lunch. She voiced understanding

## 2019-12-05 ENCOUNTER — Telehealth: Payer: Self-pay

## 2019-12-05 NOTE — Telephone Encounter (Signed)
Can you let patient know that she is not due for any labs. Last done in July. Thanks

## 2019-12-08 ENCOUNTER — Other Ambulatory Visit: Payer: Self-pay

## 2019-12-08 ENCOUNTER — Ambulatory Visit (INDEPENDENT_AMBULATORY_CARE_PROVIDER_SITE_OTHER): Payer: Medicare HMO | Admitting: Psychiatry

## 2019-12-08 DIAGNOSIS — J42 Unspecified chronic bronchitis: Secondary | ICD-10-CM | POA: Diagnosis not present

## 2019-12-08 DIAGNOSIS — E1142 Type 2 diabetes mellitus with diabetic polyneuropathy: Secondary | ICD-10-CM | POA: Diagnosis not present

## 2019-12-08 DIAGNOSIS — E039 Hypothyroidism, unspecified: Secondary | ICD-10-CM | POA: Diagnosis not present

## 2019-12-08 DIAGNOSIS — M160 Bilateral primary osteoarthritis of hip: Secondary | ICD-10-CM | POA: Diagnosis not present

## 2019-12-08 DIAGNOSIS — D649 Anemia, unspecified: Secondary | ICD-10-CM | POA: Diagnosis not present

## 2019-12-08 DIAGNOSIS — E1165 Type 2 diabetes mellitus with hyperglycemia: Secondary | ICD-10-CM | POA: Diagnosis not present

## 2019-12-08 DIAGNOSIS — F331 Major depressive disorder, recurrent, moderate: Secondary | ICD-10-CM

## 2019-12-08 DIAGNOSIS — J449 Chronic obstructive pulmonary disease, unspecified: Secondary | ICD-10-CM | POA: Diagnosis not present

## 2019-12-08 DIAGNOSIS — F250 Schizoaffective disorder, bipolar type: Secondary | ICD-10-CM | POA: Diagnosis not present

## 2019-12-08 DIAGNOSIS — F17210 Nicotine dependence, cigarettes, uncomplicated: Secondary | ICD-10-CM | POA: Diagnosis not present

## 2019-12-08 NOTE — Progress Notes (Signed)
Virtual Visit via Telephone Note  I connected with Kassandra Meriweather Rosenboom on 12/08/19 at 4:10 PM EDT  by telephone and verified that I am speaking with the correct person using two identifiers.   I discussed the limitations, risks, security and privacy concerns of performing an evaluation and management service by telephone and the availability of in person appointments. I also discussed with the patient that there may be a patient responsible charge related to this service. The patient expressed understanding and agreed to proceed.   I provided 50 minutes of non-face-to-face time during this encounter.   [name], LCSW                      THERAPIST PROGRESS NOTE  Location: Patient-home [name] - [place] office  Session Time: Monday 12/08/2019 4:10 PM -  5:00 PM                     Participation Level: Active  Behavioral Response:  depressed, anxious  Type of Therapy: Individual Therapy  Treatment Goals addressed: Patient wants to learn how to improve coping skills to manage chronic pain and health issues/ improve mood    Interventions: CBT and Supportive   Summary: Yasenia Reedy Keeven is a 69 y.o. female who presents with  long standing history of recurrent periods  of depression beginning when she was [age] and her favorite uncle died. Patient reports multiple psychiatric hospitlaizations due to depression and suicidal ideations with the last one occuring in 1997. Patient has participated in outpatient psychotherapy and medication management intermittently since age 11. She currently is seeing psychiatrist Dr. Harrington Challenger . Prior to this, she was seen at [facility]. Patient also has had ECT at [facility]. Symptoms have worsened in recent months due to family stress and have  included depressed mood, anxiety, excessive worry, and tearfulness.         Patient's last contact was by virtual visit via telephone about 2-3  weeks ago. She reports decreased symptoms of depression and  increased behavioral activation.  She has resumed attending church and continues to rely on her spirituality.  However, she expresses frustration and anger regarding recent conflict with one of her church members.  Patient states trying to keep things in and tried to keep the peace.  She expresses relief she has been assigned another CNA and reports this relationship is going well so far.  Patient is trying to cope with pain and says she is angry about having pain.    Suicidal/Homicidal: Nowithout intent/plan  Therapist Response: Reviewed symptoms, discussed stressors, facilitated expression of thoughts and feelings, validated feelings, praised and reinforced patient's increased behavioral activation and resumed attendance at church, discussed the effects on her mood and her pain level, discussed the effects of anger on her pain, reviewed ways to express self assertively rather than aggressively, also reviewed ways to trigger relaxation response in the body, develop plan with patient to practice imagery daily between sessions   Plan: Return again in 2 weeks.  Diagnosis: Axis I: MDD, Recurrent, moderate    Axis II: Deferred    Chaselynn Kepple E Carleah Yablonski, LCSW

## 2019-12-10 ENCOUNTER — Ambulatory Visit
Admission: RE | Admit: 2019-12-10 | Discharge: 2019-12-10 | Disposition: A | Discharge status: Home / Self Care | Payer: Medicare HMO | Source: Ambulatory Visit | Attending: Family Medicine | Admitting: Family Medicine

## 2019-12-10 ENCOUNTER — Other Ambulatory Visit: Payer: Self-pay

## 2019-12-10 DIAGNOSIS — Z1231 Encounter for screening mammogram for malignant neoplasm of breast: Secondary | ICD-10-CM

## 2019-12-11 ENCOUNTER — Ambulatory Visit (INDEPENDENT_AMBULATORY_CARE_PROVIDER_SITE_OTHER): Payer: Medicare HMO | Admitting: Family Medicine

## 2019-12-11 ENCOUNTER — Encounter: Payer: Self-pay | Admitting: Family Medicine

## 2019-12-11 VITALS — BP 130/70 | HR 63 | Resp 16 | Ht 59.0 in | Wt 168.0 lb

## 2019-12-11 DIAGNOSIS — F060 Psychotic disorder with hallucinations due to known physiological condition: Secondary | ICD-10-CM | POA: Diagnosis not present

## 2019-12-11 DIAGNOSIS — I10 Essential (primary) hypertension: Secondary | ICD-10-CM | POA: Diagnosis not present

## 2019-12-11 DIAGNOSIS — G8929 Other chronic pain: Secondary | ICD-10-CM

## 2019-12-11 DIAGNOSIS — E1142 Type 2 diabetes mellitus with diabetic polyneuropathy: Secondary | ICD-10-CM | POA: Diagnosis not present

## 2019-12-11 DIAGNOSIS — R110 Nausea: Secondary | ICD-10-CM

## 2019-12-11 DIAGNOSIS — E66811 Obesity, class 1: Secondary | ICD-10-CM

## 2019-12-11 DIAGNOSIS — M79641 Pain in right hand: Secondary | ICD-10-CM | POA: Diagnosis not present

## 2019-12-11 DIAGNOSIS — F17210 Nicotine dependence, cigarettes, uncomplicated: Secondary | ICD-10-CM

## 2019-12-11 DIAGNOSIS — Z23 Encounter for immunization: Secondary | ICD-10-CM

## 2019-12-11 DIAGNOSIS — E669 Obesity, unspecified: Secondary | ICD-10-CM

## 2019-12-11 DIAGNOSIS — Z720 Tobacco use: Secondary | ICD-10-CM | POA: Diagnosis not present

## 2019-12-11 DIAGNOSIS — R0989 Other specified symptoms and signs involving the circulatory and respiratory systems: Secondary | ICD-10-CM

## 2019-12-11 DIAGNOSIS — E1143 Type 2 diabetes mellitus with diabetic autonomic (poly)neuropathy: Secondary | ICD-10-CM

## 2019-12-11 NOTE — Patient Instructions (Addendum)
Keep appointment for annual physical exam as before, call if you need me before  Flu vaccine  Today  TSH and non fast chem 7 and EGFR in the next 1 week  Good that you are down to 2 to 3 cigarettes/ day, please work on quitting  You are referred to Orthopedic Copy, re right hand  Zofran 4 mg iM in office today for nausea  You are being referred to Dr Hilarie Fredrickson re chronic nausea and poor appetite  Nurse please send for eye exam from Dr Jorja Loa done in [place] Feb of this year   Think about what you will eat, plan ahead. Choose " clean, green, fresh or frozen" over canned, processed or packaged foods which are more sugary, salty and fatty. 70 to 75% of food eaten should be vegetables and fruit. Three meals at set times with snacks allowed between meals, but they must be fruit or vegetables. Aim to eat over a 12 hour period , example 7 am to 7 pm, and STOP after  your last meal of the day. Drink water,generally about 64 ounces per day, no other drink is as healthy. Fruit juice is best enjoyed in a healthy way, by EATING the fruit. It is important that you exercise regularly at least 30 minutes 5 times a week. If you develop chest pain, have severe difficulty breathing, or feel very tired, stop exercising immediately and seek medical attention

## 2019-12-11 NOTE — Assessment & Plan Note (Signed)
Asked:confirms currently smokes cigarettes °Assess: Unwilling to set a quit date, but is cutting back °Advise: needs to QUIT to reduce risk of cancer, cardio and cerebrovascular disease °Assist: counseled for 5 minutes and literature provided °Arrange: follow up in 2 to 4 months ° °

## 2019-12-11 NOTE — Assessment & Plan Note (Signed)
Controlled, no change in medication [name] is reminded of the importance of commitment to daily physical activity for 30 minutes or more, as able and the need to limit carbohydrate intake to 30 to 60 grams per meal to help with blood sugar control.   The need to take medication as prescribed, test blood sugar as directed, and to call between visits if there is a concern that blood sugar is uncontrolled is also discussed.   [name] is reminded of the importance of daily foot exam, annual eye examination, and good blood sugar, blood pressure and cholesterol control.  Diabetic Labs Latest Ref Rng & Units 11/06/2019 10/23/2019 09/23/2019 08/21/2019 06/24/2019  HbA1c 4.0 - 5.6 % 5.9(A) - - - 6.4(A)  Microalbumin Not Estab. ug/mL - - - 25.3(H) -  Micro/Creat Ratio 0 - 29 mg/g creat - - - 24 -  Chol 100 - 199 mg/dL - - 125 - -  HDL >39 mg/dL - - 45 - -  Calc LDL 0 - 99 mg/dL - - 59 - -  Triglycerides 0 - 149 mg/dL - - 117 - -  Creatinine 0.44 - 1.00 mg/dL - 1.23(H) - - -  GFR >60.00 mL/min - - - - -   BP/Weight 12/11/2019 11/23/2019 11/06/2019 10/09/2019 09/18/2019 07/14/9670 10/19/7913  Systolic BP 041 364 383 779 396 886 484  Diastolic BP 70 89 60 74 58 69 72  Wt. (Lbs) 168 171 167 171 166.4 165.04 163.13  BMI 33.93 34.54 33.73 34.54 33.61 33.33 32.95  Some encounter information is confidential and restricted. Go to Review Flowsheets activity to see all data.   Foot/eye exam completion dates Latest Ref Rng & Units 01/01/2019 04/30/2018  Eye Exam No Retinopathy - Retinopathy(A)  Foot exam Order - - -  Foot Form Completion - Done -

## 2019-12-11 NOTE — Assessment & Plan Note (Signed)
Controlled, no change in medication  

## 2019-12-11 NOTE — Progress Notes (Signed)
[name]     MRN: [id number]      DOB: 11/08/1950   HPI [name] is here for follow up and re-evaluation of chronic medical conditions, medication management and review of any available recent lab and radiology data.  Preventive health is updated, specifically  Cancer screening and Immunization.   Questions or concerns regarding consultations or procedures which the PT has had in the interim are  addressed. The PT denies any adverse reactions to current medications since the last visit.  6 month h/o right hand pain and numbness, had CTS pver 10 years ago  ROS Denies recent fever or chills. Denies sinus pressure, nasal congestion, ear pain or sore throat. Denies chest congestion, productive cough or wheezing. Denies chest pains, palpitations and leg swelling Denies abdominal pain, Denies headaches, seizures, numbness, or tingling. C/o  depression, anxiety and  insomnia.not suicidal Denies skin break down or rash.   PE  BP 130/70   Pulse 63   Resp 16   Ht 4\' 11"  (1.499 m)   Wt 168 lb (76.2 kg)   SpO2 96%   BMI 33.93 kg/m   Patient alert and oriented and in no cardiopulmonary distress.  HEENT: No facial asymmetry, EOMI,     Neck supple .  Chest: Clear to auscultation bilaterally.  CVS: S1, S2 no murmurs, no S3.Regular rate.  ABD: Soft non tender.   Ext: No edema  MS: decreased  ROM spine, shoulders, hips and knees.  Skin: Intact, no ulcerations or rash noted.  Psych: Good eye contact, normal affect. Memory intact not anxious or depressed appearing.  CNS: CN 2-12 intact, power,  normal throughout.no focal deficits noted.   Assessment & Plan  Essential hypertension Controlled, no change in medication   Tobacco user Asked:confirms currently smokes cigarettes Assess: Unwilling to set a quit date, but is cutting back Advise: needs to QUIT to reduce risk of cancer, cardio and cerebrovascular disease Assist: counseled for 5 minutes and literature  provided Arrange: follow up in 2 to 4 months   Diabetic polyneuropathy associated with type 2 diabetes mellitus ([place]) Controlled, no change in medication [name] is reminded of the importance of commitment to daily physical activity for 30 minutes or more, as able and the need to limit carbohydrate intake to 30 to 60 grams per meal to help with blood sugar control.   The need to take medication as prescribed, test blood sugar as directed, and to call between visits if there is a concern that blood sugar is uncontrolled is also discussed.   [name] is reminded of the importance of daily foot exam, annual eye examination, and good blood sugar, blood pressure and cholesterol control.  Diabetic Labs Latest Ref Rng & Units 11/06/2019 10/23/2019 09/23/2019 08/21/2019 06/24/2019  HbA1c 4.0 - 5.6 % 5.9(A) - - - 6.4(A)  Microalbumin Not Estab. ug/mL - - - 25.3(H) -  Micro/Creat Ratio 0 - 29 mg/g creat - - - 24 -  Chol 100 - 199 mg/dL - - 125 - -  HDL >39 mg/dL - - 45 - -  Calc LDL 0 - 99 mg/dL - - 59 - -  Triglycerides 0 - 149 mg/dL - - 117 - -  Creatinine 0.44 - 1.00 mg/dL - 1.23(H) - - -  GFR >60.00 mL/min - - - - -   BP/Weight 12/11/2019 11/23/2019 11/06/2019 10/09/2019 09/18/2019 09/15/2261 05/14/5454  Systolic BP 256 389 373 428 768 115 726  Diastolic BP 70 89 60 74  58 69 72  Wt. (Lbs) 168 171 167 171 166.4 165.04 163.13  BMI 33.93 34.54 33.73 34.54 33.61 33.33 32.95  Some encounter information is confidential and restricted. Go to Review Flowsheets activity to see all data.   Foot/eye exam completion dates Latest Ref Rng & Units 01/01/2019 04/30/2018  Eye Exam No Retinopathy - Retinopathy(A)  Foot exam Order - - -  Foot Form Completion - Done -        Nausea Increased nausea with poor appetite reported. Zofran 4 mg im and refer GI if sooner appt available Weight unchanged in past 12 months  Obesity (BMI 30.0-34.9)  Patient re-educated about  the importance of commitment to a  minimum of  150 minutes of exercise per week as able.  The importance of healthy food choices with portion control discussed, as well as eating regularly and within a 12 hour window most days. The need to choose "clean , green" food 50 to 75% of the time is discussed, as well as to make water the primary drink and set a goal of 64 ounces water daily.    Weight /BMI 12/11/2019 11/23/2019 11/06/2019  WEIGHT 168 lb 171 lb 167 lb  HEIGHT 4\' 11"  4\' 11"  4\' 11"   BMI 33.93 kg/m2 34.54 kg/m2 33.73 kg/m2  Some encounter information is confidential and restricted. Go to Review Flowsheets activity to see all data.      Labile hypertension Controlled, no change in medication DASH diet and commitment to daily physical activity for a minimum of 30 minutes discussed and encouraged, as a part of hypertension management. The importance of attaining a healthy weight is also discussed.  BP/Weight 12/11/2019 11/23/2019 11/06/2019 10/09/2019 09/18/2019 11/17/6732 03/21/3788  Systolic BP 240 973 532 992 426 834 196  Diastolic BP 70 89 60 74 58 69 72  Wt. (Lbs) 168 171 167 171 166.4 165.04 163.13  BMI 33.93 34.54 33.73 34.54 33.61 33.33 32.95  Some encounter information is confidential and restricted. Go to Review Flowsheets activity to see all data.       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   The need to take medication as prescribed, test blood sugar as directed, and to call between visits if there is a concern that blood sugar is uncontrolled is also discussed.   [name] is reminded of the importance of daily foot exam, annual eye examination, and good blood  sugar, blood pressure and cholesterol control.  Diabetic Labs Latest Ref Rng & Units 12/11/2019 11/06/2019 10/23/2019 09/23/2019 08/21/2019  HbA1c 4.0 - 5.6 % - 5.9(A) - - -  Microalbumin Not Estab. ug/mL - - - - 25.3(H)  Micro/Creat Ratio 0 - 29 mg/g creat - - - - 24  Chol 100 - 199 mg/dL - - - 125 -  HDL >39 mg/dL - - - 45 -  Calc LDL 0 - 99 mg/dL - - - 59 -  Triglycerides 0 - 149 mg/dL - - - 117 -  Creatinine 0.57 - 1.00 mg/dL 0.94 - 1.23(H) - -  GFR >60.00 mL/min - - - - -   BP/Weight 12/11/2019 11/23/2019 11/06/2019 10/09/2019 09/18/2019 08/20/2019 08/13/2019  Systolic BP 997 741 423 953 202 334 356  Diastolic BP 70 89 60 74 58 69 72  Wt. (Lbs) 168 171 167 171 166.4 165.04 163.13  BMI 33.93 34.54 33.73 34.54 33.61 33.33 32.95  Some encounter information is confidential and restricted. Go to Review Flowsheets activity to see all data.   Foot/eye exam completion dates Latest Ref Rng & Units 01/01/2019 04/30/2018  Eye Exam No Retinopathy - Retinopathy(A)  Foot exam Order - - -  Foot Form Completion - Done -        Chronic pain of right hand 6 monht h/o increased pain and numbness, remote  H/o CTS, ortho to re eval

## 2019-12-12 DIAGNOSIS — J449 Chronic obstructive pulmonary disease, unspecified: Secondary | ICD-10-CM | POA: Diagnosis not present

## 2019-12-12 DIAGNOSIS — I10 Essential (primary) hypertension: Secondary | ICD-10-CM | POA: Diagnosis not present

## 2019-12-12 LAB — BASIC METABOLIC PANEL
BUN/Creatinine Ratio: 24 (ref 12–28)
BUN: 23 mg/dL (ref 8–27)
CO2: 24 mmol/L (ref 20–29)
Calcium: 9.3 mg/dL (ref 8.7–10.3)
Chloride: 110 mmol/L — ABNORMAL HIGH (ref 96–106)
Creatinine, Ser: 0.94 mg/dL (ref 0.57–1.00)
GFR calc Af Amer: 72 mL/min/{1.73_m2} (ref >59)
GFR calc non Af Amer: 62 mL/min/{1.73_m2} (ref >59)
Glucose: 76 mg/dL (ref 65–99)
Potassium: 4.3 mmol/L (ref 3.5–5.2)
Sodium: 146 mmol/L — ABNORMAL HIGH (ref 134–144)

## 2019-12-12 LAB — TSH: TSH: 0.631 u[IU]/mL (ref 0.450–4.500)

## 2019-12-15 ENCOUNTER — Other Ambulatory Visit: Payer: Self-pay | Admitting: Internal Medicine

## 2019-12-15 DIAGNOSIS — E1165 Type 2 diabetes mellitus with hyperglycemia: Secondary | ICD-10-CM | POA: Diagnosis not present

## 2019-12-15 DIAGNOSIS — Z794 Long term (current) use of insulin: Secondary | ICD-10-CM

## 2019-12-15 DIAGNOSIS — E039 Hypothyroidism, unspecified: Secondary | ICD-10-CM | POA: Diagnosis not present

## 2019-12-15 DIAGNOSIS — Z79899 Other long term (current) drug therapy: Secondary | ICD-10-CM

## 2019-12-15 DIAGNOSIS — K589 Irritable bowel syndrome without diarrhea: Secondary | ICD-10-CM

## 2019-12-15 DIAGNOSIS — I10 Essential (primary) hypertension: Secondary | ICD-10-CM | POA: Diagnosis not present

## 2019-12-15 DIAGNOSIS — D649 Anemia, unspecified: Secondary | ICD-10-CM | POA: Diagnosis not present

## 2019-12-15 DIAGNOSIS — M160 Bilateral primary osteoarthritis of hip: Secondary | ICD-10-CM | POA: Diagnosis not present

## 2019-12-15 DIAGNOSIS — F250 Schizoaffective disorder, bipolar type: Secondary | ICD-10-CM | POA: Diagnosis not present

## 2019-12-15 DIAGNOSIS — K5730 Diverticulosis of large intestine without perforation or abscess without bleeding: Secondary | ICD-10-CM

## 2019-12-15 DIAGNOSIS — F17210 Nicotine dependence, cigarettes, uncomplicated: Secondary | ICD-10-CM | POA: Diagnosis not present

## 2019-12-15 DIAGNOSIS — J42 Unspecified chronic bronchitis: Secondary | ICD-10-CM | POA: Diagnosis not present

## 2019-12-15 DIAGNOSIS — Z7982 Long term (current) use of aspirin: Secondary | ICD-10-CM

## 2019-12-15 DIAGNOSIS — J449 Chronic obstructive pulmonary disease, unspecified: Secondary | ICD-10-CM | POA: Diagnosis not present

## 2019-12-15 DIAGNOSIS — E1142 Type 2 diabetes mellitus with diabetic polyneuropathy: Secondary | ICD-10-CM | POA: Diagnosis not present

## 2019-12-15 DIAGNOSIS — R296 Repeated falls: Secondary | ICD-10-CM

## 2019-12-16 ENCOUNTER — Encounter: Payer: Self-pay | Admitting: Family Medicine

## 2019-12-17 NOTE — Assessment & Plan Note (Signed)
Increased nausea with poor appetite reported. Zofran 4 mg im and refer GI if sooner appt available Weight unchanged in past 12 months

## 2019-12-17 NOTE — Assessment & Plan Note (Signed)
Managed by Endo and stable Ms. Layton is reminded of the importance of commitment to daily physical activity for 30 minutes or more, as able and the need to limit carbohydrate intake to 30 to 60 grams per meal to help with blood sugar control.   The need to take medication as prescribed, test blood sugar as directed, and to call between visits if there is a concern that blood sugar is uncontrolled is also discussed.   Ms. Zellman is reminded of the importance of daily foot exam, annual eye examination, and good blood sugar, blood pressure and cholesterol control.  Diabetic Labs Latest Ref Rng & Units 12/11/2019 11/06/2019 10/23/2019 09/23/2019 08/21/2019  HbA1c 4.0 - 5.6 % - 5.9(A) - - -  Microalbumin Not Estab. ug/mL - - - - 25.3(H)  Micro/Creat Ratio 0 - 29 mg/g creat - - - - 24  Chol 100 - 199 mg/dL - - - 125 -  HDL >39 mg/dL - - - 45 -  Calc LDL 0 - 99 mg/dL - - - 59 -  Triglycerides 0 - 149 mg/dL - - - 117 -  Creatinine 0.57 - 1.00 mg/dL 0.94 - 1.23(H) - -  GFR >60.00 mL/min - - - - -   BP/Weight 12/11/2019 11/23/2019 11/06/2019 10/09/2019 09/18/2019 05/15/3297 04/16/2681  Systolic BP 419 622 297 989 211 941 740  Diastolic BP 70 89 60 74 58 69 72  Wt. (Lbs) 168 171 167 171 166.4 165.04 163.13  BMI 33.93 34.54 33.73 34.54 33.61 33.33 32.95  Some encounter information is confidential and restricted. Go to Review Flowsheets activity to see all data.   Foot/eye exam completion dates Latest Ref Rng & Units 01/01/2019 04/30/2018  Eye Exam No Retinopathy - Retinopathy(A)  Foot exam Order - - -  Foot Form Completion - Done -

## 2019-12-17 NOTE — Assessment & Plan Note (Signed)
6 monht h/o increased pain and numbness, remote  H/o CTS, ortho to re eval

## 2019-12-17 NOTE — Assessment & Plan Note (Signed)
  Patient re-educated about  the importance of commitment to a  minimum of 150 minutes of exercise per week as able.  The importance of healthy food choices with portion control discussed, as well as eating regularly and within a 12 hour window most days. The need to choose "clean , green" food 50 to 75% of the time is discussed, as well as to make water the primary drink and set a goal of 64 ounces water daily.    Weight /BMI 12/11/2019 11/23/2019 11/06/2019  WEIGHT 168 lb 171 lb 167 lb  HEIGHT 4\' 11"  4\' 11"  4\' 11"   BMI 33.93 kg/m2 34.54 kg/m2 33.73 kg/m2  Some encounter information is confidential and restricted. Go to Review Flowsheets activity to see all data.

## 2019-12-17 NOTE — Assessment & Plan Note (Signed)
Stable and managed by Psych 

## 2019-12-17 NOTE — Assessment & Plan Note (Signed)
Controlled, no change in medication DASH diet and commitment to daily physical activity for a minimum of 30 minutes discussed and encouraged, as a part of hypertension management. The importance of attaining a healthy weight is also discussed.  BP/Weight 12/11/2019 11/23/2019 11/06/2019 10/09/2019 09/18/2019 03/14/4578 11/19/8336  Systolic BP 250 539 767 341 937 902 409  Diastolic BP 70 89 60 74 58 69 72  Wt. (Lbs) 168 171 167 171 166.4 165.04 163.13  BMI 33.93 34.54 33.73 34.54 33.61 33.33 32.95  Some encounter information is confidential and restricted. Go to Review Flowsheets activity to see all data.

## 2019-12-22 ENCOUNTER — Other Ambulatory Visit: Payer: Self-pay | Admitting: Family Medicine

## 2019-12-23 ENCOUNTER — Ambulatory Visit: Payer: Medicare HMO | Admitting: Family Medicine

## 2019-12-23 DIAGNOSIS — E1142 Type 2 diabetes mellitus with diabetic polyneuropathy: Secondary | ICD-10-CM | POA: Diagnosis not present

## 2019-12-23 DIAGNOSIS — D649 Anemia, unspecified: Secondary | ICD-10-CM | POA: Diagnosis not present

## 2019-12-23 DIAGNOSIS — F250 Schizoaffective disorder, bipolar type: Secondary | ICD-10-CM | POA: Diagnosis not present

## 2019-12-23 DIAGNOSIS — J449 Chronic obstructive pulmonary disease, unspecified: Secondary | ICD-10-CM | POA: Diagnosis not present

## 2019-12-23 DIAGNOSIS — E1165 Type 2 diabetes mellitus with hyperglycemia: Secondary | ICD-10-CM | POA: Diagnosis not present

## 2019-12-23 DIAGNOSIS — E039 Hypothyroidism, unspecified: Secondary | ICD-10-CM | POA: Diagnosis not present

## 2019-12-23 DIAGNOSIS — J42 Unspecified chronic bronchitis: Secondary | ICD-10-CM | POA: Diagnosis not present

## 2019-12-23 DIAGNOSIS — F17210 Nicotine dependence, cigarettes, uncomplicated: Secondary | ICD-10-CM | POA: Diagnosis not present

## 2019-12-23 DIAGNOSIS — M160 Bilateral primary osteoarthritis of hip: Secondary | ICD-10-CM | POA: Diagnosis not present

## 2019-12-24 ENCOUNTER — Other Ambulatory Visit: Payer: Self-pay

## 2019-12-24 ENCOUNTER — Ambulatory Visit (INDEPENDENT_AMBULATORY_CARE_PROVIDER_SITE_OTHER): Payer: Medicare HMO | Admitting: Psychiatry

## 2019-12-24 DIAGNOSIS — F331 Major depressive disorder, recurrent, moderate: Secondary | ICD-10-CM | POA: Diagnosis not present

## 2019-12-24 NOTE — Progress Notes (Signed)
Virtual Visit via Telephone Note  I connected with [name] on 12/24/19 at 4:12 PM EDT  by telephone and verified that I am speaking with the correct person using two identifiers.   I discussed the limitations, risks, security and privacy concerns of performing an evaluation and management service by telephone and the availability of in person appointments. I also discussed with the patient that there may be a patient responsible charge related to this service. The patient expressed understanding and agreed to proceed.   I provided 48 minutes of non-face-to-face time during this encounter.        [name], LCSW                      THERAPIST PROGRESS NOTE  Location: Patient-home [name] - [place] office  Session Time: Wednesday 12/24/2019 4:12 PM  - 5:00  PM                  Participation Level: Active  Behavioral Response:  depressed, anxious  Type of Therapy: Individual Therapy  Treatment Goals addressed: Patient wants to learn how to improve coping skills to manage chronic pain and health issues/ improve mood    Interventions: CBT and Supportive   Summary: [name] is a 69 y.o. female who presents with  long standing history of recurrent periods  of depression beginning when she was [age] and her favorite uncle died. Patient reports multiple psychiatric hospitlaizations due to depression and suicidal ideations with the last one occuring in 1997. Patient has participated in outpatient psychotherapy and medication management intermittently since age 37. She currently is seeing psychiatrist Dr. Harrington Challenger . Prior to this, she was seen at [facility]. Patient also has had ECT at [facility]. Symptoms have worsened in recent months due to family stress and have  included depressed mood, anxiety, excessive worry, and tearfulness.         Patient's last contact was by virtual visit via telephone about 2-3  weeks ago. She reports continued decreased symptoms of  depression and increased behavioral activation.  She continues to attend church and c rely on her spirituality.  She reports increased thoughts about past conflict at church but has tried to work through her feelings using writing and says this has been helpful.  She continues to express frustration about having pain but is trying to manage better and verbalizes an understanding of the effect of anger on her pain.  She is working with her pain doctor but reports recent incident of becoming offended and angry by a recent interaction during her appointment.  However she is pleased she did not respond in anger.  She reports later calling the office to express her concerns and has scheduled an appointment to meet with her doctor on 01/09/2020.  She still remains angry about the incident.   Suicidal/Homicidal: Nowithout intent/plan  Therapist Response: Reviewed symptoms, discussed stressors, facilitated expression of thoughts and feelings, validated feelings, praised and reinforced patient's continued behavioral activation/attendance that she is church/and the use of writing, also praised and reinforced patient's efforts to pause before responding when angry, discussed anger as a secondary emotion, assisted patient identify underlying feelings beneath anger to help her with emotion identification and regulation, discussed ways to have assertive communication with provider about her concerns, developed plan with patient to write her concerns and practice what she plans to say before meeting with doctor.  Plan: Return again in 2 weeks.  Diagnosis: Axis I:  MDD, Recurrent, moderate    Axis II: Deferred    Dayle Mcnerney E Americus Perkey, LCSW

## 2019-12-31 ENCOUNTER — Telehealth: Payer: Self-pay | Admitting: Internal Medicine

## 2019-12-31 DIAGNOSIS — G4733 Obstructive sleep apnea (adult) (pediatric): Secondary | ICD-10-CM

## 2019-12-31 NOTE — Telephone Encounter (Signed)
Patient is returning phone call. Patient phone number is [phone].

## 2019-12-31 NOTE — Telephone Encounter (Signed)
ATC pt, the line was picked up and then disconnected. I tried to call back again and received a busy signal. Will try back.

## 2020-01-01 NOTE — Telephone Encounter (Signed)
LMTC x 1  

## 2020-01-02 NOTE — Telephone Encounter (Signed)
Called and spoke with patient to let her know that we were sending order to [facility] for new machine. Order has been placed. She expressed understanding. Nothing further needed.

## 2020-01-02 NOTE — Telephone Encounter (Signed)
Order- DME [facility]- please replace recalled CPAP machine auto 5-12, mask of choice, humidifier, supplies and please add AirView/ card

## 2020-01-02 NOTE — Telephone Encounter (Signed)
Called and spoke to patient.  Patient is requesting order for new cpap machine, as her current machine has been recalled. Patient stated that per [facility], an order is needed by MD.   [name], please advise. Thanks

## 2020-01-05 DIAGNOSIS — M160 Bilateral primary osteoarthritis of hip: Secondary | ICD-10-CM | POA: Diagnosis not present

## 2020-01-05 DIAGNOSIS — E039 Hypothyroidism, unspecified: Secondary | ICD-10-CM | POA: Diagnosis not present

## 2020-01-05 DIAGNOSIS — E1165 Type 2 diabetes mellitus with hyperglycemia: Secondary | ICD-10-CM | POA: Diagnosis not present

## 2020-01-05 DIAGNOSIS — D649 Anemia, unspecified: Secondary | ICD-10-CM | POA: Diagnosis not present

## 2020-01-05 DIAGNOSIS — E1142 Type 2 diabetes mellitus with diabetic polyneuropathy: Secondary | ICD-10-CM | POA: Diagnosis not present

## 2020-01-05 DIAGNOSIS — F250 Schizoaffective disorder, bipolar type: Secondary | ICD-10-CM | POA: Diagnosis not present

## 2020-01-05 DIAGNOSIS — F17210 Nicotine dependence, cigarettes, uncomplicated: Secondary | ICD-10-CM | POA: Diagnosis not present

## 2020-01-05 DIAGNOSIS — J449 Chronic obstructive pulmonary disease, unspecified: Secondary | ICD-10-CM | POA: Diagnosis not present

## 2020-01-05 DIAGNOSIS — J42 Unspecified chronic bronchitis: Secondary | ICD-10-CM | POA: Diagnosis not present

## 2020-01-05 NOTE — Progress Notes (Signed)
Cardiology Office Note    Date:  01/07/2020   ID:  Leontina Skidmore Foos, DOB [birth year], MRN [id number]  PCP:  [name], MD  Cardiologist: Kate Sable, MD (Inactive) EPS: None  No chief complaint on file.   History of Present Illness:  [name] is a 69 y.o. female with history of labile hypertension, hyperlipidemia, history of chest pain with normal cardiac cath in 2007, low risk nuclear stress test 07/2017, OSA on CPAP followed by pulmonary, anxiety depression.  Patient complains of chest tightness with wheezing with little activity relieved with inhalers. Continues to smoke three quarters of a pack of cigarettes daily but down from 1-1/2 packs daily. Walks a mile a couple days a week and does chair exercises. CPAP was recalled 3 months ago and has had trouble getting a replacement. Has trouble sleeping and has daytime sleepiness. Has an aide at her house that helps her daily.   Past Medical History:  Diagnosis Date  . Allergy   . Anemia   . Anxiety    takes Ativan daily  . Arthritis   . Assistance needed for mobility   . Bipolar disorder ([place])    takes Risperdal nightly  . Blood transfusion   . Cancer ([place])    In her gum  . Carpal tunnel syndrome of right wrist 05/23/2011  . Cervical disc disorder with radiculopathy of cervical region 10/31/2012  . Chronic back pain   . Chronic idiopathic constipation   . Chronic neck and back pain   . Colon polyps   . COPD (chronic obstructive pulmonary disease) with chronic bronchitis ([place]) 09/16/2013   Office Spirometry 10/30/2013-submaximal effort based on appearance of loop and curve. Numbers would fit with severe restriction but her physiologic capability may be better than this. FVC 0.91/44%, and 10.74/45%, FEV1/FVC 0.81, FEF 25-75% 1.43/69%    . Diabetes mellitus    Type II  . Diverticulosis    TCS 9/08 by Dr. Delfin Edis for diarrhea . Bx for micro scopic colitis negative.   . Fibromyalgia   . Frequent falls   .  GERD (gastroesophageal reflux disease)    takes Aciphex daily  . Glaucoma    eye drops daily  . Gum symptoms    infection on antibiotic  . Hemiplegia affecting non-dominant side, post-stroke 08/02/2011  . Hiatal hernia   . Hyperlipidemia    takes Crestor daily  . Hypertension    takes Amlodipine,Metoprolol,and Clonidine daily  . Hypothyroidism    takes Synthroid daily  . IBS (irritable bowel syndrome)   . Insomnia    takes Trazodone nightly  . Major depression, recurrent ([place])    takes Zoloft daily  . 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 Metabolic encephalopathy 1/63/8453  . Migraines    chronic headaches  . Mononeuritis lower limb   . Narcolepsy   . Osteoporosis   . Pancreatitis 2006   due to Depakote with normal EUS   . Schatzki's ring    non critical / EGD with ED 8/2011with RMR  . Seizures ([place])    takes Lamictal daily.Last seizure 3 yrs ago  . Sleep apnea    on CPAP  . Small bowel obstruction ([place])   . Stroke [facility])    left sided weakness, speech changes  . Tubular adenoma of colon     Past Surgical History:  Procedure Laterality Date  . ABDOMINAL HYSTERECTOMY  1978  . BACK SURGERY  July 2012  . BACTERIAL OVERGROWTH TEST N/A 05/05/2013  Procedure: BACTERIAL OVERGROWTH TEST;  Surgeon: Daneil Dolin, MD;  [facility];  Service: Endoscopy;  Laterality: N/A;  7:30  . BIOPSY THYROID  2009  . BRAIN SURGERY  11/2011   resection of meningioma  . BREAST REDUCTION SURGERY  1994  . CARDIAC CATHETERIZATION  05/10/2005   normal coronaries, normal LV systolic function and EF (Dr. Jackie Plum)  . CARPAL TUNNEL RELEASE Left 07/22/04   Dr. Aline Brochure  . CATARACT EXTRACTION Bilateral   . CHOLECYSTECTOMY  1984  . COLONOSCOPY N/A 09/25/2012   XYI:[id number] diverticulosis.  colonic polyp-removed : tubular adenoma  . CRANIOTOMY  11/23/2011   Procedure: CRANIOTOMY TUMOR EXCISION;  Surgeon: Hosie Spangle, MD;  Location: [place] NEURO ORS;  Service: Neurosurgery;  Laterality:  N/A;  Craniotomy for tumor resection  . ESOPHAGOGASTRODUODENOSCOPY  12/29/2010   Rourk-Retained food in the esophagus and stomach, small hiatal hernia, status post Maloney dilation of the esophagus  . ESOPHAGOGASTRODUODENOSCOPY N/A 09/25/2012   SMO:[id number] atonic baggy esophagus status post Maloney dilation 14 F. Hiatal hernia  . GIVENS CAPSULE STUDY N/A 01/15/2013   NORMAL.   . IR GENERIC HISTORICAL  03/17/2016   IR RADIOLOGIST EVAL & MGMT 03/17/2016 [facility]-INTERV RAD  . LESION REMOVAL N/A 05/31/2015   Procedure: REMOVAL RIGHT AND LEFT LESIONS OF MANDIBLE;  Surgeon: Diona Browner, DDS;  Location: [place];  Service: Oral Surgery;  Laterality: N/A;  . MALONEY DILATION  12/29/2010   RMR;  . NM MYOCAR PERF WALL MOTION  2006   "relavtiely normal" persantine, mild anterior thinning (breast attenuation artifact), no region of scar/ischemia  . OVARIAN CYST REMOVAL    . RECTOCELE REPAIR N/A 06/29/2015   Procedure: POSTERIOR REPAIR (RECTOCELE);  Surgeon: Jonnie Kind, MD;  Location: [facility] ORS;  Service: Gynecology;  Laterality: N/A;  . REDUCTION MAMMAPLASTY Bilateral   . SPINE SURGERY  09/29/2010   Dr. Rolena Infante  . surgical excision of 3 tumors from right thigh and right buttock  and left upper thigh  2010  . TOOTH EXTRACTION Bilateral 12/14/2014   Procedure: REMOVAL OF BILATERAL MANDIBULAR EXOSTOSES;  Surgeon: Diona Browner, DDS;  Location: [place];  Service: Oral Surgery;  Laterality: Bilateral;  . TRANSTHORACIC ECHOCARDIOGRAM  2010   EF 60-65%, mild conc LVH, grade 1 diastolic dysfunction; mildly calcified MV annulus with mildly thickened leaflets, mildly calcified MR annulus    Current Medications: Current Meds  Medication Sig  . albuterol (VENTOLIN HFA) 108 (90 Base) MCG/ACT inhaler INHALE 1 TO 2 PUFFS EVERY 6 HOURS AS NEEDED FOR WHEEZING, SHORTNESS OF BREATH  . Alcohol Swabs (B-D SINGLE USE SWABS REGULAR) PADS USE TO CLEAN FINGER PRIOR TO STICKING FOR BLOOD SUGAR.  [name] alendronate (FOSAMAX) 70 MG tablet Take  1 tablet (70 mg total) by mouth every 7 (seven) days. Take with a full glass of water on an empty stomach.  [name] amLODipine (NORVASC) 10 MG tablet TAKE 1 TABLET EVERY DAY  . ascorbic acid (VITAMIN C) 500 MG tablet Take 500 mg by mouth daily.  [name] aspirin EC 81 MG tablet Take 1 tablet (81 mg total) by mouth daily with breakfast.  . Blood Glucose Calibration (ACCU-CHEK GUIDE CONTROL) LIQD USE PRN TO CALIBRATE GLUCOMETER  . blood glucose meter kit and supplies Dispense based on patient and insurance preference. Use up to four times daily as directed. (FOR ICD-10 E10.9, E11.9).  [name] buPROPion (WELLBUTRIN XL) 150 MG 24 hr tablet Take 1 tablet (150 mg total) by mouth every morning.  . cetirizine (ZYRTEC) 10  MG tablet Take 1 tablet (10 mg total) by mouth daily.  . chlorhexidine (PERIDEX) 0.12 % solution Use as directed 15 mLs in the mouth or throat 2 (two) times daily.  . clobetasol cream (TEMOVATE) 8.58 % Apply 1 application topically 2 (two) times daily.   . Continuous Blood Gluc Sensor (FREESTYLE LIBRE 14 DAY SENSOR) MISC 1 each by Does not apply route every 14 (fourteen) days. Change every 2 weeks  . cromolyn (NASALCROM) 5.2 MG/ACT nasal spray Place 1 spray into both nostrils 4 (four) times daily.  . diclofenac Sodium (VOLTAREN) 1 % GEL APPLY 2 GRAMS  TOPICALLY 4 (FOUR) TIMES DAILY.  [name] dicyclomine (BENTYL) 10 MG capsule Take 1 capsule (10 mg total) by mouth 3 (three) times daily as needed for spasms.  [name] ezetimibe (ZETIA) 10 MG tablet Take 1 tablet (10 mg total) by mouth daily.  [name] glucose blood (ACCU-CHEK GUIDE) test strip USE TO CHECK BLOOD SUGAR FOUR TIMES A DAY AND PRN  . hydroxychloroquine (PLAQUENIL) 200 MG tablet Take 200 mg by mouth 2 (two) times daily.  . hydrOXYzine (ATARAX/VISTARIL) 25 MG tablet Take 1 tablet (25 mg total) by mouth daily as needed for itching.  . Insulin Glargine, 2 Unit Dial, (TOUJEO MAX SOLOSTAR) 300 UNIT/ML SOPN Inject 25 Units into the skin at bedtime.  . lamoTRIgine (LAMICTAL)  100 MG tablet Take 1 tablet (100 mg total) by mouth 2 (two) times daily.  . Lancets (ACCU-CHEK SOFT TOUCH) lancets Use as instructed to check blood sugar 3 times a day.  . levothyroxine (SYNTHROID) 50 MCG tablet TAKE 1 TABLET DAILY, EXCEPT TAKE 1/2 TABLET ON SUNDAY  . lidocaine (XYLOCAINE) 2 % solution RINSE WITH 15MLS AND SPIT OUT AFTER 30 SECONDS FOUR TIMES DAILY AS NEEDED FOR PAIN.  [name] linaclotide (LINZESS) 72 MCG capsule Take 1 capsule (72 mcg total) by mouth daily before breakfast. Pharmacy-please d/c linzess 145 mcg dosage  . LORazepam (ATIVAN) 0.5 MG tablet Take 1 tablet (0.5 mg total) by mouth 3 (three) times daily.  [name] losartan (COZAAR) 50 MG tablet TAKE 1 TABLET EVERY DAY  . lubiprostone (AMITIZA) 8 MCG capsule Take 1 capsule (8 mcg total) by mouth 2 (two) times daily with a meal. Pharmacy- d/c script for amitiza 24 mcg  . metoprolol tartrate (LOPRESSOR) 50 MG tablet TAKE 1 TABLET TWICE DAILY (NEED MD APPOINTMENT)  . montelukast (SINGULAIR) 10 MG tablet TAKE 1 TABLET EVERY DAY  . NOVOLOG FLEXPEN 100 UNIT/ML FlexPen INJECT 10 TO 14 UNITS INTO THE SKIN 3 (THREE) TIMES DAILY WITH MEALS.  [name] nystatin (MYCOSTATIN/NYSTOP) powder APPLY TO AFFECTED AREA 4 TIMES DAILY.  [name] Omega-3 Fatty Acids (FISH OIL PO) Take 1 capsule by mouth daily.  . ondansetron (ZOFRAN) 4 MG tablet Take 1 tablet (4 mg total) by mouth every 8 (eight) hours as needed for nausea or vomiting.  [name] oxyCODONE-acetaminophen (PERCOCET) 10-325 MG tablet Take 1 tablet by mouth every 8 (eight) hours as needed.   . pregabalin (LYRICA) 75 MG capsule Take 1 capsule (75 mg total) by mouth daily.  . RABEprazole (ACIPHEX) 20 MG tablet TAKE 1 TABLET TWICE DAILY (NEED MD APPOINTMENT)  . risperiDONE (RISPERDAL) 0.5 MG tablet Take 1 tablet (0.5 mg total) by mouth at bedtime.  . rosuvastatin (CRESTOR) 5 MG tablet TAKE 1 TABLET AT BEDTIME  . sertraline (ZOLOFT) 100 MG tablet Take 1 tablet (100 mg total) by mouth daily.  [name] STIOLTO RESPIMAT 2.5-2.5  MCG/ACT AERS INHALE 2 PUFFS INTO THE LUNGS DAILY.  . traZODone (  DESYREL) 100 MG tablet Take 1 tablet (100 mg total) by mouth at bedtime.  [name] UNABLE TO [facility] bed mattress x 1  DX: G47.33, J44.9     Allergies:   Cephalexin, Iron, Milk-related compounds, Penicillins, and Phenazopyridine hcl   Social History   Socioeconomic History  . Marital status: Divorced    Spouse name: Not on file  . Number of children: 1  . Years of education: 28  . Highest education level: High school graduate  Occupational History  . Occupation: Disabled  Tobacco Use  . Smoking status: Current Every Day Smoker    Packs/day: 0.50    Years: 30.00    Pack years: 15.00    Types: Cigarettes  . Smokeless tobacco: Never Used  . Tobacco comment: 5-10 cigs a day  Vaping Use  . Vaping Use: Never used  Substance and Sexual Activity  . Alcohol use: No    Alcohol/week: 0.0 standard drinks    Comment:    . Drug use: No  . Sexual activity: Not Currently  Other Topics Concern  . Not on file  Social History Narrative   01/29/18 Lives alone, has 3 aides, Mon- Fri 8 hrs, 2 hrs on Sat-Sun, RN manages her meds   Caffeine use: Drink coffee sometimes    Right handed    Social Determinants of Health   Financial Resource Strain: Low Risk   . Difficulty of Paying Living Expenses: Not very hard  Food Insecurity: No Food Insecurity  . Worried About Charity fundraiser in the Last Year: Never true  . Ran Out of Food in the Last Year: Never true  Transportation Needs: No Transportation Needs  . Lack of Transportation (Medical): No  . Lack of Transportation (Non-Medical): No  Physical Activity: Inactive  . Days of Exercise per Week: 0 days  . Minutes of Exercise per Session: 0 min  Stress: Stress Concern Present  . Feeling of Stress : To some extent  Social Connections: Moderately Isolated  . Frequency of Communication with Friends and Family: More than three times a week  . Frequency of Social Gatherings with  Friends and Family: More than three times a week  . Attends Religious Services: More than 4 times per year  . Active Member of Clubs or Organizations: No  . Attends Archivist Meetings: Never  . Marital Status: Divorced     Family History:  The patient's family history includes Alcohol abuse in her maternal uncle; Cancer in her sister and sister; Diabetes in her brother, brother, and father; Heart attack in her maternal grandfather and mother; Hypertension in her brother and son; Kidney failure in her father; Pancreatic cancer in her sister; Pneumonia in her father; Sleep apnea in her son; Stroke in her maternal grandmother.   ROS:   Please see the history of present illness.    ROS All other systems reviewed and are negative.   PHYSICAL EXAM:   VS:  BP (!) 148/72   Pulse 64   Ht _0  (1.499 m)   Wt 170 lb 6.4 oz (77.3 kg)   SpO2 97%   BMI 34.42 kg/m   Physical Exam  GEN: Obese, in no acute distress  Neck: no JVD, carotid bruits, or masses Cardiac:RRR; no murmurs, rubs, or gallops  Respiratory: Decreased breath sounds throughout GI: soft, nontender, nondistended, + BS Ext: without cyanosis, clubbing, or edema, Good distal pulses bilaterally Neuro:  Alert and Oriented x 3 Psych: euthymic mood, full affect  Wt Readings from Last 3 Encounters:  01/07/20 170 lb 6.4 oz (77.3 kg)  12/11/19 168 lb (76.2 kg)  11/23/19 171 lb (77.6 kg)      Studies/Labs Reviewed:   EKG:  EKG is not ordered today.    Recent Labs: 10/23/2019: ALT 29; Hemoglobin 10.9; Platelets 250 12/11/2019: BUN 23; Creatinine, Ser 0.94; Potassium 4.3; Sodium 146; TSH 0.631   Lipid Panel    Component Value Date/Time   CHOL 125 09/23/2019 1129   TRIG 117 09/23/2019 1129   HDL 45 09/23/2019 1129   CHOLHDL 3.5 09/30/2018 1143   VLDL 26 07/19/2015 0925   LDLCALC 59 09/23/2019 1129   [place] 79 09/30/2018 1143    Additional studies/ records that were reviewed today include:    NST: 07/2017  The  study is normal.  This is a low risk study.  The left ventricular ejection fraction is hyperdynamic (>65%).  Blood pressure demonstrated a normal response to exercise.  There was no ST segment deviation noted during stress.   Normal resting and stress perfusion. No ischemia or infarction EF 72%   Echocardiogram: 02/2015 Study Conclusions   - Left ventricle: The cavity size was normal. Wall thickness was   increased in a pattern of mild LVH. Systolic function was normal.   The estimated ejection fraction was in the range of 60% to 65%.   Indeterminate diastolic function. Wall motion was normal; there   were no regional wall motion abnormalities. - Aortic valve: Valve area (VTI): 2.06 cm^2. Valve area (Vmax):   2.39 cm^2. - Mitral valve: There was mild regurgitation. - Left atrium: The atrium was mildly dilated. - Atrial septum: No defect or patent foramen ovale was identified. - Pulmonary arteries: Systolic pressure was moderately increased.   PA peak pressure: 45 mm Hg (S). - Technically adequate study.         ASSESSMENT:    1. Essential hypertension   2. Hyperlipidemia, unspecified hyperlipidemia type   3. Chest pain, unspecified type   4. Bilateral carotid bruits   5. Tobacco abuse      PLAN:  In order of problems listed above:  Essential hypertension  Hyperlipidemia  History of chest pain with low risk nuclear stress test 07/2017, normal cardiac cath 2007-complains of chest pain with wheezing with activity eases with inhalers. Would follow-up with pulmonary and no further work-up at this time.  OSA on CPAP followed by pulmonary CPAP is currently on recall and pulmonary has ordered a replacement. It has not come in yet. She is having a lot of symptoms from this.    Medication Adjustments/Labs and Tests Ordered: Current medicines are reviewed at length with the patient today.  Concerns regarding medicines are outlined above.  Medication changes, Labs and  Tests ordered today are listed in the Patient Instructions below. Patient Instructions  Medication Instructions:  Your physician recommends that you continue on your current medications as directed. Please refer to the Current Medication list given to you today.  *If you need a refill on your cardiac medications before your next appointment, please call your pharmacy*   Lab Work: None If you have labs (blood work) drawn today and your tests are completely normal, you will receive your results only by: [name] MyChart Message (if you have MyChart) OR . A paper copy in the mail If you have any lab test that is abnormal or we need to change your treatment, we will call you to review the results.   Testing/Procedures: Your physician  has requested that you have a carotid duplex. This test is an ultrasound of the carotid arteries in your neck. It looks at blood flow through these arteries that supply the brain with blood. Allow one hour for this exam. There are no restrictions or special instructions.  Follow-Up: At [facility], you and your health needs are our priority.  As part of our continuing mission to provide you with exceptional heart care, we have created designated Provider Care Teams.  These Care Teams include your primary Cardiologist (physician) and Advanced Practice Providers (APPs -  Physician Assistants and Nurse Practitioners) who all work together to provide you with the care you need, when you need it.  We recommend signing up for the patient portal called "MyChart".  Sign up information is provided on this After Visit Summary.  MyChart is used to connect with patients for Virtual Visits (Telemedicine).  Patients are able to view lab/test results, encounter notes, upcoming appointments, etc.  Non-urgent messages can be sent to your provider as well.   To learn more about what you can do with MyChart, go to [email or web address].    Your next appointment:   1 year(s)  The  format for your next appointment:   In Person  Provider:   You may see Dr Harl Bowie or one of the following Advanced Practice Providers on your designated Care Team:    Bernerd Pho, PA-C   Ermalinda Barrios, PA-C        Signed, Ermalinda Barrios, [place]  01/07/2020 1:43 PM    [address], [place]  [address] Phone: [phone]; Fax: [phone]

## 2020-01-06 DIAGNOSIS — D649 Anemia, unspecified: Secondary | ICD-10-CM | POA: Diagnosis not present

## 2020-01-06 DIAGNOSIS — E039 Hypothyroidism, unspecified: Secondary | ICD-10-CM | POA: Diagnosis not present

## 2020-01-06 DIAGNOSIS — J42 Unspecified chronic bronchitis: Secondary | ICD-10-CM | POA: Diagnosis not present

## 2020-01-06 DIAGNOSIS — E1165 Type 2 diabetes mellitus with hyperglycemia: Secondary | ICD-10-CM | POA: Diagnosis not present

## 2020-01-06 DIAGNOSIS — M160 Bilateral primary osteoarthritis of hip: Secondary | ICD-10-CM | POA: Diagnosis not present

## 2020-01-06 DIAGNOSIS — J449 Chronic obstructive pulmonary disease, unspecified: Secondary | ICD-10-CM | POA: Diagnosis not present

## 2020-01-06 DIAGNOSIS — F250 Schizoaffective disorder, bipolar type: Secondary | ICD-10-CM | POA: Diagnosis not present

## 2020-01-06 DIAGNOSIS — F17210 Nicotine dependence, cigarettes, uncomplicated: Secondary | ICD-10-CM | POA: Diagnosis not present

## 2020-01-06 DIAGNOSIS — E1142 Type 2 diabetes mellitus with diabetic polyneuropathy: Secondary | ICD-10-CM | POA: Diagnosis not present

## 2020-01-07 ENCOUNTER — Other Ambulatory Visit: Payer: Self-pay

## 2020-01-07 ENCOUNTER — Ambulatory Visit (INDEPENDENT_AMBULATORY_CARE_PROVIDER_SITE_OTHER): Payer: Medicare HMO | Admitting: Physician Assistant

## 2020-01-07 ENCOUNTER — Ambulatory Visit (INDEPENDENT_AMBULATORY_CARE_PROVIDER_SITE_OTHER): Payer: Medicare HMO | Admitting: Psychiatry

## 2020-01-07 ENCOUNTER — Encounter: Payer: Self-pay | Admitting: Physician Assistant

## 2020-01-07 VITALS — BP 148/72 | HR 64 | Ht 59.0 in | Wt 170.4 lb

## 2020-01-07 DIAGNOSIS — R0989 Other specified symptoms and signs involving the circulatory and respiratory systems: Secondary | ICD-10-CM

## 2020-01-07 DIAGNOSIS — Z720 Tobacco use: Secondary | ICD-10-CM

## 2020-01-07 DIAGNOSIS — I10 Essential (primary) hypertension: Secondary | ICD-10-CM

## 2020-01-07 DIAGNOSIS — F331 Major depressive disorder, recurrent, moderate: Secondary | ICD-10-CM | POA: Diagnosis not present

## 2020-01-07 DIAGNOSIS — R079 Chest pain, unspecified: Secondary | ICD-10-CM

## 2020-01-07 DIAGNOSIS — E785 Hyperlipidemia, unspecified: Secondary | ICD-10-CM

## 2020-01-07 NOTE — Progress Notes (Signed)
Virtual Visit via Telephone Note  I connected with [name] on 01/07/20 at 4:10 PM EDT by telephone and verified that I am speaking with the correct person using two identifiers.  Location: Patient: Home Provider: [facility] office    I discussed the limitations, risks, security and privacy concerns of performing an evaluation and management service by telephone and the availability of in person appointments. I also discussed with the patient that there may be a patient responsible charge related to this service. The patient expressed understanding and agreed to proceed.    I provided 45 minutes of non-face-to-face time during this encounter.   [name] Smoker, LCSW                     THERAPIST PROGRESS NOTE  Location: Patient-home [name] - [place] office  Session Time: Wednesday 01/07/2020 4:10 PM  -  4:55 PM                      Participation Level: Active  Behavioral Response:  depressed, anxious  Type of Therapy: Individual Therapy  Treatment Goals addressed: Patient wants to learn how to improve coping skills to manage chronic pain and health issues/ improve mood    Interventions: CBT and Supportive   Summary: [name] is a 69 y.o. female who presents with  long standing history of recurrent periods  of depression beginning when she was [age] and her favorite uncle died. Patient reports multiple psychiatric hospitlaizations due to depression and suicidal ideations with the last one occuring in 1997. Patient has participated in outpatient psychotherapy and medication management intermittently since age 69. She currently is seeing psychiatrist Dr. Harrington Challenger . Prior to this, she was seen at [facility]. Patient also has had ECT at [facility]. Symptoms have worsened in recent months due to family stress and have  included depressed mood, anxiety, excessive worry, and tearfulness.         Patient's last contact was by virtual visit via  telephone about 2-3  weeks ago. She reports continued decreased symptoms of depression and increased behavioral activation. She reports continued pain but trying to cope better. She has been using soothing music and listening to sermons to try to distract herself from the pain. She also reports she has been sewing to make clothes for her granddaughter's doll. Reports increased efforts to be more assertive rather than aggressive and cites recent example of interaction with her son. She is pleased conversation was productive and son is having more frequent contact with patient. She is scheduled to meet with her pain doctor this week and wants to express her concerns about their last interaction as well as her concerns about managing pain.   Suicidal/Homicidal: Nowithout intent/plan  Therapist Response: Reviewed symptoms, praised and reinforced patient's increased use of distracting activities to try to cope with pain, discussed effects on mood/thoughts/behavior, developed plan with patient to maintain consistency regarding efforts, praised and reinforced patient's efforts to use assertiveness skills versus being aggressive, discussed effects, assisted patient identify ways to improve assertive communication with the use of  I Messages, discussed possible script for patient to practice and use to express her concerns to her doctor  Plan: Return again in 2 weeks.  Diagnosis: Axis I: MDD, Recurrent, moderate    Axis II: Deferred    [name] E [name] Ploch, LCSW

## 2020-01-07 NOTE — Patient Instructions (Signed)
Medication Instructions:  Your physician recommends that you continue on your current medications as directed. Please refer to the Current Medication list given to you today.  *If you need a refill on your cardiac medications before your next appointment, please call your pharmacy*   Lab Work: None If you have labs (blood work) drawn today and your tests are completely normal, you will receive your results only by: [name] MyChart Message (if you have MyChart) OR . A paper copy in the mail If you have any lab test that is abnormal or we need to change your treatment, we will call you to review the results.   Testing/Procedures: Your physician has requested that you have a carotid duplex. This test is an ultrasound of the carotid arteries in your neck. It looks at blood flow through these arteries that supply the brain with blood. Allow one hour for this exam. There are no restrictions or special instructions.  Follow-Up: At [facility], you and your health needs are our priority.  As part of our continuing mission to provide you with exceptional heart care, we have created designated Provider Care Teams.  These Care Teams include your primary Cardiologist (physician) and Advanced Practice Providers (APPs -  Physician Assistants and Nurse Practitioners) who all work together to provide you with the care you need, when you need it.  We recommend signing up for the patient portal called "MyChart".  Sign up information is provided on this After Visit Summary.  MyChart is used to connect with patients for Virtual Visits (Telemedicine).  Patients are able to view lab/test results, encounter notes, upcoming appointments, etc.  Non-urgent messages can be sent to your provider as well.   To learn more about what you can do with MyChart, go to [email or web address].    Your next appointment:   1 year(s)  The format for your next appointment:   In Person  Provider:   You may see Dr Harl Bowie or one of  the following Advanced Practice Providers on your designated Care Team:    [place], PA-C   Ermalinda Barrios, [place]

## 2020-01-09 DIAGNOSIS — Z79891 Long term (current) use of opiate analgesic: Secondary | ICD-10-CM | POA: Diagnosis not present

## 2020-01-09 DIAGNOSIS — M961 Postlaminectomy syndrome, not elsewhere classified: Secondary | ICD-10-CM | POA: Diagnosis not present

## 2020-01-09 DIAGNOSIS — M5030 Other cervical disc degeneration, unspecified cervical region: Secondary | ICD-10-CM | POA: Diagnosis not present

## 2020-01-12 DIAGNOSIS — J449 Chronic obstructive pulmonary disease, unspecified: Secondary | ICD-10-CM | POA: Diagnosis not present

## 2020-01-12 DIAGNOSIS — I10 Essential (primary) hypertension: Secondary | ICD-10-CM | POA: Diagnosis not present

## 2020-01-15 ENCOUNTER — Other Ambulatory Visit: Payer: Self-pay | Admitting: Family Medicine

## 2020-01-15 ENCOUNTER — Ambulatory Visit (HOSPITAL_COMMUNITY): Payer: Medicare HMO

## 2020-01-19 ENCOUNTER — Other Ambulatory Visit: Payer: Self-pay | Admitting: Student

## 2020-01-19 DIAGNOSIS — J42 Unspecified chronic bronchitis: Secondary | ICD-10-CM | POA: Diagnosis not present

## 2020-01-19 DIAGNOSIS — R2681 Unsteadiness on feet: Secondary | ICD-10-CM | POA: Diagnosis not present

## 2020-01-19 DIAGNOSIS — E1165 Type 2 diabetes mellitus with hyperglycemia: Secondary | ICD-10-CM | POA: Diagnosis not present

## 2020-01-19 DIAGNOSIS — F17210 Nicotine dependence, cigarettes, uncomplicated: Secondary | ICD-10-CM | POA: Diagnosis not present

## 2020-01-19 DIAGNOSIS — E039 Hypothyroidism, unspecified: Secondary | ICD-10-CM | POA: Diagnosis not present

## 2020-01-19 DIAGNOSIS — M160 Bilateral primary osteoarthritis of hip: Secondary | ICD-10-CM | POA: Diagnosis not present

## 2020-01-19 DIAGNOSIS — J449 Chronic obstructive pulmonary disease, unspecified: Secondary | ICD-10-CM | POA: Diagnosis not present

## 2020-01-19 DIAGNOSIS — E1142 Type 2 diabetes mellitus with diabetic polyneuropathy: Secondary | ICD-10-CM | POA: Diagnosis not present

## 2020-01-19 DIAGNOSIS — F250 Schizoaffective disorder, bipolar type: Secondary | ICD-10-CM | POA: Diagnosis not present

## 2020-01-20 ENCOUNTER — Encounter: Payer: Self-pay | Admitting: Podiatry

## 2020-01-20 ENCOUNTER — Other Ambulatory Visit: Payer: Self-pay

## 2020-01-20 ENCOUNTER — Telehealth: Payer: Self-pay | Admitting: Internal Medicine

## 2020-01-20 ENCOUNTER — Ambulatory Visit (INDEPENDENT_AMBULATORY_CARE_PROVIDER_SITE_OTHER): Payer: Medicare HMO | Admitting: Podiatry

## 2020-01-20 DIAGNOSIS — E119 Type 2 diabetes mellitus without complications: Secondary | ICD-10-CM

## 2020-01-20 DIAGNOSIS — Q828 Other specified congenital malformations of skin: Secondary | ICD-10-CM | POA: Diagnosis not present

## 2020-01-20 DIAGNOSIS — M2011 Hallux valgus (acquired), right foot: Secondary | ICD-10-CM | POA: Diagnosis not present

## 2020-01-20 DIAGNOSIS — G4733 Obstructive sleep apnea (adult) (pediatric): Secondary | ICD-10-CM

## 2020-01-20 DIAGNOSIS — E1142 Type 2 diabetes mellitus with diabetic polyneuropathy: Secondary | ICD-10-CM | POA: Diagnosis not present

## 2020-01-20 DIAGNOSIS — M2012 Hallux valgus (acquired), left foot: Secondary | ICD-10-CM | POA: Diagnosis not present

## 2020-01-20 DIAGNOSIS — B351 Tinea unguium: Secondary | ICD-10-CM

## 2020-01-20 NOTE — Patient Outreach (Signed)
[facility]) Care Management  01/20/2020  [name] 1950/07/30 [id number]   Telephone call to patient for disease management follow up.   No answer.  HIPAA compliant voice message left.    Plan: If no return call, RN CM will attempt patient again in February.  Jone Baseman, RN, MSN [facility] [facility] Care Management Coordinator Direct Line [phone] Cell [phone] Toll Free: [phone]  Fax: [phone]

## 2020-01-20 NOTE — Telephone Encounter (Signed)
New order placed

## 2020-01-20 NOTE — Telephone Encounter (Signed)
This is a [place] pt.  °

## 2020-01-20 NOTE — Telephone Encounter (Signed)
We need a new order

## 2020-01-20 NOTE — Progress Notes (Signed)
ANNUAL DIABETIC FOOT EXAM  Subjective: [name] presents today for for annual diabetic foot examination and painful porokeratotic lesion(s) right foot and painful mycotic toenails that limit ambulation. Painful toenails interfere with ambulation. Aggravating factors include wearing enclosed shoe gear. Pain is relieved with periodic professional debridement. Painful porokeratotic lesions are aggravated when weightbearing with and without shoegear. Pain is relieved with periodic professional debridement..  Patient relates several year h/o diabetes.  Patient denies h/o foot wounds.  Patient has symptoms of foot numbness.  Patient has symptoms of foot tingling..  Patient's blood sugar was 126 mg/dl this morning.   [name], MD is patient's PCP. Last visit was 12/15/2019.  Patient states she has been having more falls. States she had fall on Sunday and her Doristine Bosworth assisted her.  Past Medical History:  Diagnosis Date  . Allergy   . Anemia   . Anxiety    takes Ativan daily  . Arthritis   . Assistance needed for mobility   . Bipolar disorder ([place])    takes Risperdal nightly  . Blood transfusion   . Cancer ([place])    In her gum  . Carpal tunnel syndrome of right wrist 05/23/2011  . Cervical disc disorder with radiculopathy of cervical region 10/31/2012  . Chronic back pain   . Chronic idiopathic constipation   . Chronic neck and back pain   . Colon polyps   . COPD (chronic obstructive pulmonary disease) with chronic bronchitis ([place]) 09/16/2013   Office Spirometry 10/30/2013-submaximal effort based on appearance of loop and curve. Numbers would fit with severe restriction but her physiologic capability may be better than this. FVC 0.91/44%, and 10.74/45%, FEV1/FVC 0.81, FEF 25-75% 1.43/69%    . Diabetes mellitus    Type II  . Diverticulosis    TCS 9/08 by Dr. Delfin Edis for diarrhea . Bx for micro scopic colitis negative.   . Fibromyalgia   . Frequent falls   . GERD  (gastroesophageal reflux disease)    takes Aciphex daily  . Glaucoma    eye drops daily  . Gum symptoms    infection on antibiotic  . Hemiplegia affecting non-dominant side, post-stroke 08/02/2011  . Hiatal hernia   . Hyperlipidemia    takes Crestor daily  . Hypertension    takes Amlodipine,Metoprolol,and Clonidine daily  . Hypothyroidism    takes Synthroid daily  . IBS (irritable bowel syndrome)   . Insomnia    takes Trazodone nightly  . Major depression, recurrent ([place])    takes Zoloft daily  . 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 Metabolic encephalopathy 05/18/6576  . Migraines    chronic headaches  . Mononeuritis lower limb   . Narcolepsy   . Osteoporosis   . Pancreatitis 2006   due to Depakote with normal EUS   . Schatzki's ring    non critical / EGD with ED 8/2011with RMR  . Seizures ([place])    takes Lamictal daily.Last seizure 3 yrs ago  . Sleep apnea    on CPAP  . Small bowel obstruction ([place])   . Stroke [facility])    left sided weakness, speech changes  . Tubular adenoma of colon    Patient Active Problem List   Diagnosis Date Noted  . Tubular adenoma of colon 02/09/2019  . Benign neoplasm of cerebral meninges ([place]) 11/12/2018  . Benign meningioma of brain ([place]) 10/31/2018  . Osteoarthritis of both hips 07/06/2018  . Lipoma of extremity 12/31/2017  . Impingement syndrome of left shoulder region 12/27/2017  .  Leg weakness, bilateral 10/28/2017  . Lumbar spondylosis with myelopathy 10/12/2017  . Numbness of hand 10/10/2017  . Chronic neck pain 07/25/2017  . Chronic pain syndrome 07/25/2017  . At risk for cardiovascular event 06/10/2017  . Diabetic polyneuropathy associated with type 2 diabetes mellitus ([place]) 05/19/2016  . Tobacco user 04/24/2016  . Obesity (BMI 30.0-34.9) 04/24/2016  . History of palpitations 08/09/2015  . Nausea 08/09/2015  . Labile hypertension 08/03/2015  . Normal coronary arteries 08/03/2015  . Left-sided low back pain with left-sided  sciatica 06/27/2015  . Multinodular goiter 05/06/2015  . Rectocele, female 04/27/2015  . Anal sphincter incontinence 04/27/2015  . Pelvic relaxation due to rectocele 03/30/2015  . Pulmonary hypertension ([place]) 02/22/2015  . Light cigarette smoker (1-9 cigarettes per day) 01/11/2015  . Migraine without aura and without status migrainosus, not intractable 07/02/2014  . Flatulence 02/18/2014  . Microcytic anemia 02/18/2014  . COPD mixed type ([place]) 09/16/2013  . Hypothyroidism 08/16/2013  . Gastroparesis 04/28/2013  . Seizure disorder ([place]) 01/19/2013  . Cervical disc disorder with radiculopathy of cervical region 10/31/2012  . Solitary pulmonary nodule 08/19/2012  . Anemia 07/05/2012  . 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 Meningioma ([place]) 11/19/2011  . 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 Carpal tunnel syndrome of right wrist 05/23/2011  . Chronic pain of right hand 05/04/2011  . Polypharmacy 04/28/2011  . Bipolar disorder ([place]) 04/28/2011  . Constipation 04/13/2011  . Recurrent falls 12/12/2010  . Oropharyngeal dysphagia 07/12/2010  . Urinary incontinence 12/16/2009  . HEARING LOSS 10/26/2009  . Well controlled type 2 diabetes mellitus with gastroparesis ([place]) 07/07/2009  . Hyperlipidemia 12/11/2008  . IBS 12/11/2008  . GERD 07/29/2008  . MILK PRODUCTS ALLERGY 07/29/2008  . 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 Essential hypertension 06/27/2007  . Backache 06/19/2007  . Osteoporosis 06/19/2007  . Obstructive sleep apnea 06/19/2007  . TRIGGER FINGER 04/18/2007  . DIVERTICULOSIS, COLON 11/13/2006   Past Surgical History:  Procedure Laterality Date  . ABDOMINAL HYSTERECTOMY  1978  . BACK SURGERY  July 2012  . BACTERIAL OVERGROWTH TEST N/A 05/05/2013   Procedure: BACTERIAL OVERGROWTH TEST;  Surgeon: Daneil Dolin, MD;  Location[facility];  Service: Endoscopy;  Laterality: N/A;  7:30  . BIOPSY THYROID   2009  . BRAIN SURGERY  11/2011   resection of meningioma  . BREAST REDUCTION SURGERY  1994  . CARDIAC CATHETERIZATION  05/10/2005   normal coronaries, normal LV systolic function and EF (Dr. Jackie Plum)  . CARPAL TUNNEL RELEASE Left 07/22/04   Dr. Aline Brochure  . CATARACT EXTRACTION Bilateral   . CHOLECYSTECTOMY  1984  . COLONOSCOPY N/A 09/25/2012   [id number] diverticulosis.  colonic polyp-removed : tubular adenoma  . CRANIOTOMY  11/23/2011   Procedure: CRANIOTOMY TUMOR EXCISION;  Surgeon: Hosie Spangle, MD;  Location: [place] NEURO ORS;  Service: Neurosurgery;  Laterality: N/A;  Craniotomy for tumor resection  . ESOPHAGOGASTRODUODENOSCOPY  12/29/2010   Rourk-Retained food in the esophagus and stomach, small hiatal hernia, status post Maloney dilation of the esophagus  . ESOPHAGOGASTRODUODENOSCOPY N/A 09/25/2012   [id number] atonic baggy esophagus status post Maloney dilation 26 F. Hiatal hernia  . GIVENS CAPSULE STUDY N/A 01/15/2013   NORMAL.   . IR GENERIC HISTORICAL  03/17/2016   IR RADIOLOGIST EVAL & MGMT 03/17/2016 [facility]-INTERV RAD  . LESION REMOVAL N/A 05/31/2015   Procedure: REMOVAL RIGHT AND LEFT LESIONS OF MANDIBLE;  Surgeon: Diona Browner, DDS;  Location: [facility]  OR;  Service: Oral Surgery;  Laterality: N/A;  . MALONEY DILATION  12/29/2010   RMR;  . NM MYOCAR PERF WALL MOTION  2006   "relavtiely normal" persantine, mild anterior thinning (breast attenuation artifact), no region of scar/ischemia  . OVARIAN CYST REMOVAL    . RECTOCELE REPAIR N/A 06/29/2015   Procedure: POSTERIOR REPAIR (RECTOCELE);  Surgeon: Jonnie Kind, MD;  Location: [facility] ORS;  Service: Gynecology;  Laterality: N/A;  . REDUCTION MAMMAPLASTY Bilateral   . SPINE SURGERY  09/29/2010   Dr. Rolena Infante  . surgical excision of 3 tumors from right thigh and right buttock  and left upper thigh  2010  . TOOTH EXTRACTION Bilateral 12/14/2014   Procedure: REMOVAL OF BILATERAL MANDIBULAR EXOSTOSES;  Surgeon: Diona Browner, DDS;  Location: [facility];  Service: Oral Surgery;  Laterality: Bilateral;  . TRANSTHORACIC ECHOCARDIOGRAM  2010   EF 60-65%, mild conc LVH, grade 1 diastolic dysfunction; mildly calcified MV annulus with mildly thickened leaflets, mildly calcified MR annulus   Current Outpatient Medications on File Prior to Visit  Medication Sig Dispense Refill  . albuterol (VENTOLIN HFA) 108 (90 Base) MCG/ACT inhaler INHALE 1 TO 2 PUFFS EVERY 6 HOURS AS NEEDED FOR WHEEZING, SHORTNESS OF BREATH 1 each 3  . Alcohol Swabs (B-D SINGLE USE SWABS REGULAR) PADS USE TO CLEAN FINGER PRIOR TO STICKING FOR BLOOD SUGAR. 100 each 11  . alendronate (FOSAMAX) 70 MG tablet Take 1 tablet (70 mg total) by mouth every 7 (seven) days. Take with a full glass of water on an empty stomach. 12 tablet 3  . ascorbic acid (VITAMIN C) 500 MG tablet Take 500 mg by mouth daily.    [name] aspirin EC 81 MG tablet Take 1 tablet (81 mg total) by mouth daily with breakfast. 120 tablet 2  . Blood Glucose Calibration (ACCU-CHEK GUIDE CONTROL) LIQD USE PRN TO CALIBRATE GLUCOMETER 3 each 11  . blood glucose meter kit and supplies Dispense based on patient and insurance preference. Use up to four times daily as directed. (FOR ICD-10 E10.9, E11.9). 1 each 0  . buPROPion (WELLBUTRIN XL) 150 MG 24 hr tablet Take 1 tablet (150 mg total) by mouth every morning. 90 tablet 2  . cetirizine (ZYRTEC) 10 MG tablet Take 1 tablet (10 mg total) by mouth daily. 7 tablet 0  . chlorhexidine (PERIDEX) 0.12 % solution Use as directed 15 mLs in the mouth or throat 2 (two) times daily. 473 mL 0  . clobetasol cream (TEMOVATE) 7.71 % Apply 1 application topically 2 (two) times daily.     . Continuous Blood Gluc Sensor (FREESTYLE LIBRE 14 DAY SENSOR) MISC 1 each by Does not apply route every 14 (fourteen) days. Change every 2 weeks 2 each 11  . cromolyn (NASALCROM) 5.2 MG/ACT nasal spray Place 1 spray into both nostrils 4 (four) times daily. 13 mL 1  . diclofenac Sodium (VOLTAREN) 1 % GEL APPLY 2 GRAMS   TOPICALLY 4 (FOUR) TIMES DAILY. 800 g 0  . dicyclomine (BENTYL) 10 MG capsule Take 1 capsule (10 mg total) by mouth 3 (three) times daily as needed for spasms. 90 capsule 2  . ezetimibe (ZETIA) 10 MG tablet TAKE 1 TABLET EVERY DAY 90 tablet 1  . glucose blood (ACCU-CHEK GUIDE) test strip USE TO CHECK BLOOD SUGAR FOUR TIMES A DAY AND PRN 400 each 11  . hydroxychloroquine (PLAQUENIL) 200 MG tablet Take 200 mg by mouth 2 (two) times daily.    . hydrOXYzine (ATARAX/VISTARIL) 25 MG tablet  Take 1 tablet (25 mg total) by mouth daily as needed for itching. 15 tablet 0  . Insulin Glargine, 2 Unit Dial, (TOUJEO MAX SOLOSTAR) 300 UNIT/ML SOPN Inject 25 Units into the skin at bedtime. 9 pen 3  . lamoTRIgine (LAMICTAL) 100 MG tablet Take 1 tablet (100 mg total) by mouth 2 (two) times daily. 180 tablet 2  . Lancets (ACCU-CHEK SOFT TOUCH) lancets Use as instructed to check blood sugar 3 times a day. 100 each 12  . levothyroxine (SYNTHROID) 50 MCG tablet TAKE 1 TABLET DAILY, EXCEPT TAKE 1/2 TABLET ON SUNDAY 85 tablet 1  . lidocaine (XYLOCAINE) 2 % solution RINSE WITH 15MLS AND SPIT OUT AFTER 30 SECONDS FOUR TIMES DAILY AS NEEDED FOR PAIN.    [name] linaclotide (LINZESS) 72 MCG capsule Take 1 capsule (72 mcg total) by mouth daily before breakfast. Pharmacy-please d/c linzess 145 mcg dosage 30 capsule 2  . LORazepam (ATIVAN) 0.5 MG tablet Take 1 tablet (0.5 mg total) by mouth 3 (three) times daily. 270 tablet 2  . losartan (COZAAR) 50 MG tablet TAKE 1 TABLET EVERY DAY 90 tablet 1  . lubiprostone (AMITIZA) 8 MCG capsule Take 1 capsule (8 mcg total) by mouth 2 (two) times daily with a meal. Pharmacy- d/c script for amitiza 24 mcg 180 capsule 0  . metoprolol tartrate (LOPRESSOR) 50 MG tablet TAKE 1 TABLET TWICE DAILY (NEED MD APPOINTMENT) 180 tablet 3  . montelukast (SINGULAIR) 10 MG tablet TAKE 1 TABLET EVERY DAY 90 tablet 1  . NOVOLOG FLEXPEN 100 UNIT/ML FlexPen INJECT 10 TO 14 UNITS INTO THE SKIN 3 (THREE) TIMES DAILY  WITH MEALS. 45 mL 3  . nystatin (MYCOSTATIN/NYSTOP) powder APPLY TO AFFECTED AREA 4 TIMES DAILY. 90 g 1  . Omega-3 Fatty Acids (FISH OIL PO) Take 1 capsule by mouth daily.    . ondansetron (ZOFRAN) 4 MG tablet Take 1 tablet (4 mg total) by mouth every 8 (eight) hours as needed for nausea or vomiting. 20 tablet 0  . oxyCODONE-acetaminophen (PERCOCET) 10-325 MG tablet Take 1 tablet by mouth every 8 (eight) hours as needed.     . pregabalin (LYRICA) 75 MG capsule Take 1 capsule (75 mg total) by mouth daily.    . RABEprazole (ACIPHEX) 20 MG tablet TAKE 1 TABLET TWICE DAILY (NEED MD APPOINTMENT) 180 tablet 0  . risperiDONE (RISPERDAL) 0.5 MG tablet Take 1 tablet (0.5 mg total) by mouth at bedtime. 90 tablet 2  . rosuvastatin (CRESTOR) 5 MG tablet TAKE 1 TABLET AT BEDTIME 90 tablet 2  . sertraline (ZOLOFT) 100 MG tablet Take 1 tablet (100 mg total) by mouth daily. 90 tablet 2  . STIOLTO RESPIMAT 2.5-2.5 MCG/ACT AERS INHALE 2 PUFFS INTO THE LUNGS DAILY. 12 g 3  . traZODone (DESYREL) 100 MG tablet Take 1 tablet (100 mg total) by mouth at bedtime. 90 tablet 2  . UNABLE TO [facility] bed mattress x 1  DX: G47.33, J44.9 1 each 0  . amLODipine (NORVASC) 10 MG tablet TAKE 1 TABLET EVERY DAY 90 tablet 3   No current facility-administered medications on file prior to visit.    Allergies  Allergen Reactions  . Cephalexin Hives  . Iron Nausea And Vomiting  . Milk-Related Compounds Other (See Comments)    Doesn't agree with stomach.   . Penicillins Hives    Has patient had a PCN reaction causing immediate rash, facial/tongue/throat swelling, SOB or lightheadedness with hypotension: Yes Has patient had a PCN reaction causing severe rash involving mucus  membranes or skin necrosis: No Has patient had a PCN reaction that required hospitalization No Has patient had a PCN reaction occurring within the last 10 years: No If all of the above answers are "NO", then may proceed with Cephalosporin use.   [name]  Phenazopyridine Hcl Hives         Social History   Occupational History  . Occupation: Disabled  Tobacco Use  . Smoking status: Current Every Day Smoker    Packs/day: 0.50    Years: 30.00    Pack years: 15.00    Types: Cigarettes  . Smokeless tobacco: Never Used  . Tobacco comment: 5-10 cigs a day  Vaping Use  . Vaping Use: Never used  Substance and Sexual Activity  . Alcohol use: No    Alcohol/week: 0.0 standard drinks    Comment:    . Drug use: No  . Sexual activity: Not Currently   Family History  Problem Relation Age of Onset  . Heart attack Mother        HTN  . Pneumonia Father   . Kidney failure Father   . Diabetes Father   . Pancreatic cancer Sister   . Diabetes Brother   . Hypertension Brother   . Diabetes Brother   . Cancer Sister        breast   . Hypertension Son   . Sleep apnea Son   . Cancer Sister        pancreatic  . Stroke Maternal Grandmother   . Heart attack Maternal Grandfather   . Alcohol abuse Maternal Uncle   . Colon cancer Neg Hx   . Anesthesia problems Neg Hx   . Hypotension Neg Hx   . Malignant hyperthermia Neg Hx   . Pseudochol deficiency Neg Hx   . Breast cancer Neg Hx    Immunization History  Administered Date(s) Administered  . Fluad Quad(high Dose 65+) 01/01/2019, 12/11/2019  . H1N1 01/09/2008  . Influenza Split 12/12/2010, 11/16/2011  . Influenza Whole 11/23/2006, 12/10/2007, 12/11/2008, 11/16/2009  . Influenza,inj,Quad PF,6+ Mos 11/28/2012, 11/20/2013, 11/10/2014, 12/06/2015, 11/21/2016, 11/20/2017  . Influenza,inj,quad, With Preservative 12/11/2016, 01/07/2018  . Moderna SARS-COVID-2 Vaccination 04/27/2019, 05/25/2019  . Pneumococcal Conjugate-13 09/16/2013  . Pneumococcal Polysaccharide-23 08/03/2003, 12/11/2008, 08/22/2010, 08/31/2015  . Td 09/23/2003  . Tdap 12/29/2011  . Zoster Recombinat (Shingrix) 07/03/2019     Review of Systems: Negative except as noted in the HPI.  Objective: There were no vitals filed for  this visit.  [name] is a pleasant 69 y.o. female in NAD. AAO X 3.  Vascular Examination: Capillary refill time to digits immediate b/l. Faintly palpable pedal pulses b/l. Pedal hair sparse. Lower extremity skin temperature gradient within normal limits. No pain with calf compression b/l.  Dermatological Examination: Pedal skin with normal turgor, texture and tone bilaterally. No open wounds bilaterally. No interdigital macerations bilaterally. Toenails 1-5 right, L hallux, L 3rd toe, L 4th toe and L 5th toe elongated, discolored, dystrophic, thickened, and crumbly with subungual debris and tenderness to dorsal palpation. Porokeratotic lesion(s) sub 5th met base right foot, lateral aspect right foot and plantar aspect of heel right foot. No erythema, no edema, no drainage, no fluctuance.  Musculoskeletal Examination: Normal muscle strength 5/5 to all lower extremity muscle groups bilaterally. Hallux valgus with bunion deformity noted b/l lower extremities. Utilizes cane for ambulation assistance.  Footwear Assessment: Does the patient wear appropriate shoes?  Yes. Does the patient need inserts/orthotics? Yes. Neurological Examination: Protective sensation diminished with 10g monofilament  b/l. Vibratory sensation intact b/l.  Hemoglobin A1C Latest Ref Rng & Units 11/06/2019 06/24/2019 02/12/2019  HGBA1C 4.0 - 5.6 % 5.9(A) 6.4(A) 5.9(H)  Some recent data might be hidden    Assessment: 1. Pain due to onychomycosis of toenail   2. Porokeratosis   3. Hallux valgus, acquired, bilateral   4. Diabetic polyneuropathy associated with type 2 diabetes mellitus ([place])   5. Encounter for diabetic foot exam ([place])     ADA Risk Categorization: High Risk  Patient has one or more of the following: Loss of protective sensation Absent pedal pulses Severe Foot deformity History of foot ulcer  Plan: -Examined patient. -Instructed patient to notify PCP of fall. -Diabetic foot examination performed  on today's visit. -Continue diabetic foot care principles. -Patient to continue soft, supportive shoe gear daily. -Toenails 1-5 right, L hallux, L 3rd toe, L 4th toe and L 5th toe debrided in length and girth without iatrogenic bleeding with sterile nail nipper and dremel.  -Painful porokeratotic lesion(s) sub 5th met base right, lateral aspect right foot and plantar aspect of heel right foot pared and enucleated with sterile scalpel blade without incident. -Patient to report any pedal injuries to medical professional immediately. -Patient/POA to call should there be question/concern in the interim.  Return in about 3 months (around 04/21/2020) for diabetic toenails, corn(s)/callus(es).  Marzetta Board, DPM

## 2020-01-21 ENCOUNTER — Ambulatory Visit: Payer: Self-pay

## 2020-01-21 ENCOUNTER — Ambulatory Visit (INDEPENDENT_AMBULATORY_CARE_PROVIDER_SITE_OTHER): Payer: Medicare HMO | Admitting: Psychiatry

## 2020-01-21 DIAGNOSIS — F331 Major depressive disorder, recurrent, moderate: Secondary | ICD-10-CM

## 2020-01-21 NOTE — Telephone Encounter (Signed)
I have called both numbers and researched them the one is for diabetic supplies and the other is for incontnece I have called the patient after reading over messages again her machine is recalled the company should repair or give a new one. I have called the patient and gave her the number for Respironics.

## 2020-01-21 NOTE — Progress Notes (Signed)
Virtual Visit via Telephone Note  I connected with [name] on 01/21/20 at 4:05 PM EST  by telephone and verified that I am speaking with the correct person using two identifiers.  Location: Patient: Home Provider: [place] office    I discussed the limitations, risks, security and privacy concerns of performing an evaluation and management service by telephone and the availability of in person appointments. I also discussed with the patient that there may be a patient responsible charge related to this service. The patient expressed understanding and agreed to proceed.    I provided 40  minutes of non-face-to-face time during this encounter.   [name], LCSW                     THERAPIST PROGRESS NOTE  Location: Patient-home [name] [place] office  Session Time: Wednesday 01/21/2020 4:05 PM - 4:45 PM                 Participation Level: Active  Behavioral Response:  depressed, anxious  Type of Therapy: Individual Therapy  Treatment Goals addressed: Patient wants to learn how to improve coping skills to manage chronic pain and health issues/ improve mood    Interventions: CBT and Supportive   Summary: [name] is a 69 y.o. female who presents with  long standing history of recurrent periods  of depression beginning when she was [age] and her favorite uncle died. Patient reports multiple psychiatric hospitlaizations due to depression and suicidal ideations with the last one occuring in 1997. Patient has participated in outpatient psychotherapy and medication management intermittently since age 67. She currently is seeing psychiatrist Dr. Harrington Challenger . Prior to this, she was seen at [facility]. Patient also has had ECT at [facility]. Symptoms have worsened in recent months due to family stress and have  included depressed mood, anxiety, excessive worry, and tearfulness.         Patient's last contact was by virtual visit via telephone  about 2-3  weeks ago. She reports continued decreased symptoms of depression and increased behavioral activation. She reports expressing self assertively to her pain management doctor.  She reports being referred to [facility] to see someone who may be able to address her pain issues but patient is reluctant.  Patient is pleased with her efforts to be assertive and reports trying to being less aggressive.  She continues to experience significant pain but reports coping a little better.  She reports learning to try to accept things she cannot control.  She continues to try to stay involved in activities and attend church regularly.  She also reports enjoying a recent overnight visit from her granddaughter.  She is hopeful her son and granddaughter will be with her for   Thanksgiving.    Suicidal/Homicidal: Nowithout intent/plan  Therapist Response: Reviewed symptoms, praised and reinforced patient's of assertiveness skills/continued use of distracting activities to try to cope with pain, discussed effects on mood/thoughts/behavior, discussed acceptance of situations/pain beyond her control and the effects on mood, thoughts, and behavior  Plan: Return again in 2 weeks.  Diagnosis: Axis I: MDD, Recurrent, moderate    Axis II: Deferred    Yahya Boldman E Tyneshia Stivers, LCSW

## 2020-01-22 ENCOUNTER — Telehealth: Payer: Self-pay

## 2020-01-22 NOTE — Telephone Encounter (Signed)
Spoke with patient and she stated she will let us know about coming in to be seen on Monday. She does have an appointment with Dr.[name] on 11/29

## 2020-01-22 NOTE — Telephone Encounter (Signed)
noted 

## 2020-01-22 NOTE — Telephone Encounter (Signed)
Patient states she had a fall the other night. She got up to get water. She did not have her slip free socks on and didn't have her walker. She slipped on a little puddle of water. She refused to use her [facility] because they are always trying to put her in a nursing home.  Patient fell again this past Sunday at church. So patient fell at church on Sunday then again on Monday morning around 2:30am Nov 7th and 8th

## 2020-01-22 NOTE — Telephone Encounter (Signed)
Sure. Will review it with her. Thank you.

## 2020-01-22 NOTE — Telephone Encounter (Signed)
Really appreciate your help/input, thanks

## 2020-01-22 NOTE — Telephone Encounter (Signed)
I recommend an appointment in the office for evaluation , needs to bring meds to visit, reason recurrent falls, soonest available and will likely need 30 minute visit, recommend dr Posey Pronto as he likely has more available slots and time, I will also send him this message so he is aware  Dr Posey Pronto, I am asking that you evaluate this pt on multiple chronic medications , with  h/o recurrent falls when you have n opening, visit will be nearer a 30 minue than a 20 minute duration

## 2020-01-26 DIAGNOSIS — G4733 Obstructive sleep apnea (adult) (pediatric): Secondary | ICD-10-CM | POA: Diagnosis not present

## 2020-01-27 ENCOUNTER — Ambulatory Visit (INDEPENDENT_AMBULATORY_CARE_PROVIDER_SITE_OTHER): Payer: Medicare HMO | Admitting: Internal Medicine

## 2020-01-27 ENCOUNTER — Other Ambulatory Visit (HOSPITAL_COMMUNITY): Payer: Self-pay

## 2020-01-27 ENCOUNTER — Encounter: Payer: Self-pay | Admitting: Internal Medicine

## 2020-01-27 VITALS — BP 124/64 | HR 80 | Ht 59.0 in | Wt 170.6 lb

## 2020-01-27 DIAGNOSIS — K219 Gastro-esophageal reflux disease without esophagitis: Secondary | ICD-10-CM

## 2020-01-27 DIAGNOSIS — M6289 Other specified disorders of muscle: Secondary | ICD-10-CM

## 2020-01-27 DIAGNOSIS — K5904 Chronic idiopathic constipation: Secondary | ICD-10-CM

## 2020-01-27 DIAGNOSIS — D500 Iron deficiency anemia secondary to blood loss (chronic): Secondary | ICD-10-CM

## 2020-01-27 DIAGNOSIS — R140 Abdominal distension (gaseous): Secondary | ICD-10-CM

## 2020-01-27 DIAGNOSIS — D509 Iron deficiency anemia, unspecified: Secondary | ICD-10-CM

## 2020-01-27 MED ORDER — TRULANCE 3 MG PO TABS: 3.0000 mg | ORAL_TABLET | Freq: Every day | ORAL | 3 refills | Status: DC

## 2020-01-27 NOTE — Patient Instructions (Addendum)
Discontinue Linzess and Amitiza   We will send in Trulance to your pharmacy  You have been given a testing kit to check for small intestine bacterial overgrowth (SIBO) which is completed by a company named [facility]. Make sure to return your test in the mail using the return mailing label given to you along with the kit. Your demographic and insurance information have already been sent to the company and they should be in contact with you over the next week regarding this test. [facility] will collect an upfront charge of $99.74 for commercial insurance plans and $209.74 is you are paying cash. Make sure to discuss with [facility] PRIOR to having the test if they have gotten informatoin from your insurance company as to how much your testing will cost out of pocket, if any. Please keep in mind that you will be getting a call from phone number [phone] or a similar number. If you do not hear from them within this time frame, please call our office at [phone].   Follow up in February, you will need to contact the office mid December for that appointment  Due to recent changes in healthcare laws, you may see the results of your imaging and laboratory studies on MyChart before your provider has had a chance to review them.  We understand that in some cases there may be results that are confusing or concerning to you. Not all laboratory results come back in the same time frame and the provider may be waiting for multiple results in order to interpret others.  Please give [name] 48 hours in order for your provider to thoroughly review all the results before contacting the office for clarification of your results.   I appreciate the  opportunity to care for you  Thank You   Zenovia Jarred, MD

## 2020-01-27 NOTE — Progress Notes (Signed)
   Subjective:    Patient ID: [name], female    DOB: Mar 07, 1951, 69 y.o.   MRN: [id number]  HPI [name] is a 69 year old female with PMH of chronic constipation, pelvic floor dysfunction with hx of rectocele/cytocele, colonic diverticulosis, hx of colon polyps, GERD, hx of SBO, prior cholecystectomy and hysterectomy, HTN, DM, OSA and depression who is here for followup.  She is here alone today and was last seen on 08/13/19.    She reports that most recently she has been dealing with upper abdominal bloating sensation with frequent gas including upper and lower gas.  Appetite has been okay and her heartburn is mostly controlled with AcipHex 20 mg twice a day.  She is alternating Linzess and Amitiza but having diarrhea when she uses these medications.  This can lead to accidents and make it difficult for her to leave the house.  She reports she has no control with bowel movements.  She is not having blood in stool or melena.  She is not using MiraLAX often.   Review of Systems As per HPI, otherwise negative  Current Medications, Allergies, Past Medical History, Past Surgical History, Family History and Social History were reviewed in [employer] record.     Objective:   Physical Exam BP 124/64 (BP Location: Right Arm, Patient Position: Sitting, Cuff Size: Large)   Pulse 80   Ht 4\' 11"  (1.499 m)   Wt 170 lb 9.6 oz (77.4 kg)   SpO2 94%   BMI 34.46 kg/m  Gen: awake, alert, NAD HEENT: anicteric Abd: soft, obese, NT/ND, +BS throughout Ext: no c/c/e Neuro: nonfocal  CT abd/pelvis with contrast -- Oct 2020 reviewed      Assessment & Plan:  69 year old female with PMH of chronic constipation, pelvic floor dysfunction with hx of rectocele/cytocele, colonic diverticulosis, hx of colon polyps, GERD, hx of SBO, prior cholecystectomy and hysterectomy, HTN, DM, OSA and depression who is here for followup.  1. Chronic constipation/abd bloating/pelvic floor  dysfunction --she is not having ideal response to either Amitiza or Linzess as these are contributing to diarrhea, urgency and accidents.  I explained that this is not how the medication should work ideally.  I am also concerned that she may have SIBO and so I recommended the following --Discontinue Linzess and Amitiza --Trial of Trulance 3 mg once daily --She can use Bentyl 10 mg 3 times daily as needed for crampy abdominal discomfort --SIBO breath testing to be done at home  2.  GERD --stable continue AcipHex 20 mg twice daily  3.  Prior adenomatous colon polyps --surveillance colonoscopy due next year in December 2022  Follow-up in January or February, okay for refills if needed prior to this appointment  30 minutes total spent today including patient facing time, coordination of care, reviewing medical history/procedures/pertinent radiology studies, and documentation of the encounter.

## 2020-01-28 ENCOUNTER — Inpatient Hospital Stay (HOSPITAL_COMMUNITY): Payer: Medicare HMO | Attending: Hematology

## 2020-01-28 ENCOUNTER — Other Ambulatory Visit: Payer: Self-pay

## 2020-01-28 DIAGNOSIS — Z86010 Personal history of colonic polyps: Secondary | ICD-10-CM | POA: Insufficient documentation

## 2020-01-28 DIAGNOSIS — G40909 Epilepsy, unspecified, not intractable, without status epilepticus: Secondary | ICD-10-CM | POA: Insufficient documentation

## 2020-01-28 DIAGNOSIS — F17210 Nicotine dependence, cigarettes, uncomplicated: Secondary | ICD-10-CM | POA: Diagnosis not present

## 2020-01-28 DIAGNOSIS — Z8673 Personal history of transient ischemic attack (TIA), and cerebral infarction without residual deficits: Secondary | ICD-10-CM | POA: Insufficient documentation

## 2020-01-28 DIAGNOSIS — E042 Nontoxic multinodular goiter: Secondary | ICD-10-CM | POA: Insufficient documentation

## 2020-01-28 DIAGNOSIS — R944 Abnormal results of kidney function studies: Secondary | ICD-10-CM | POA: Insufficient documentation

## 2020-01-28 DIAGNOSIS — M542 Cervicalgia: Secondary | ICD-10-CM | POA: Insufficient documentation

## 2020-01-28 DIAGNOSIS — R531 Weakness: Secondary | ICD-10-CM | POA: Diagnosis not present

## 2020-01-28 DIAGNOSIS — D509 Iron deficiency anemia, unspecified: Secondary | ICD-10-CM | POA: Diagnosis not present

## 2020-01-28 DIAGNOSIS — N189 Chronic kidney disease, unspecified: Secondary | ICD-10-CM | POA: Insufficient documentation

## 2020-01-28 DIAGNOSIS — F319 Bipolar disorder, unspecified: Secondary | ICD-10-CM | POA: Insufficient documentation

## 2020-01-28 DIAGNOSIS — I272 Pulmonary hypertension, unspecified: Secondary | ICD-10-CM | POA: Diagnosis not present

## 2020-01-28 DIAGNOSIS — R5383 Other fatigue: Secondary | ICD-10-CM | POA: Insufficient documentation

## 2020-01-28 DIAGNOSIS — E039 Hypothyroidism, unspecified: Secondary | ICD-10-CM | POA: Diagnosis not present

## 2020-01-28 DIAGNOSIS — M1990 Unspecified osteoarthritis, unspecified site: Secondary | ICD-10-CM | POA: Diagnosis not present

## 2020-01-28 DIAGNOSIS — K209 Esophagitis, unspecified without bleeding: Secondary | ICD-10-CM | POA: Insufficient documentation

## 2020-01-28 DIAGNOSIS — D500 Iron deficiency anemia secondary to blood loss (chronic): Secondary | ICD-10-CM

## 2020-01-28 DIAGNOSIS — M5442 Lumbago with sciatica, left side: Secondary | ICD-10-CM | POA: Insufficient documentation

## 2020-01-28 LAB — COMPREHENSIVE METABOLIC PANEL
ALT: 20 U/L (ref 0–44)
AST: 20 U/L (ref 15–41)
Albumin: 4 g/dL (ref 3.5–5.0)
Alkaline Phosphatase: 91 U/L (ref 38–126)
Anion gap: 8 (ref 5–15)
BUN: 30 mg/dL — ABNORMAL HIGH (ref 8–23)
CO2: 25 mmol/L (ref 22–32)
Calcium: 9.3 mg/dL (ref 8.9–10.3)
Chloride: 108 mmol/L (ref 98–111)
Creatinine, Ser: 1.09 mg/dL — ABNORMAL HIGH (ref 0.44–1.00)
GFR, Estimated: 55 mL/min — ABNORMAL LOW (ref >60)
Glucose, Bld: 85 mg/dL (ref 70–99)
Potassium: 4.1 mmol/L (ref 3.5–5.1)
Sodium: 141 mmol/L (ref 135–145)
Total Bilirubin: 0.5 mg/dL (ref 0.3–1.2)
Total Protein: 7.1 g/dL (ref 6.5–8.1)

## 2020-01-28 LAB — FOLATE: Folate: 16.9 ng/mL (ref >5.9)

## 2020-01-28 LAB — CBC WITH DIFFERENTIAL/PLATELET
Abs Immature Granulocytes: 0.03 10*3/uL (ref 0.00–0.07)
Basophils Absolute: 0 10*3/uL (ref 0.0–0.1)
Basophils Relative: 1 %
Eosinophils Absolute: 0.2 10*3/uL (ref 0.0–0.5)
Eosinophils Relative: 3 %
HCT: 38.8 % (ref 36.0–46.0)
Hemoglobin: 10.9 g/dL — ABNORMAL LOW (ref 12.0–15.0)
Immature Granulocytes: 1 %
Lymphocytes Relative: 27 %
Lymphs Abs: 1.6 10*3/uL (ref 0.7–4.0)
MCH: 22.7 pg — ABNORMAL LOW (ref 26.0–34.0)
MCHC: 28.1 g/dL — ABNORMAL LOW (ref 30.0–36.0)
MCV: 80.7 fL (ref 80.0–100.0)
Monocytes Absolute: 0.6 10*3/uL (ref 0.1–1.0)
Monocytes Relative: 9 %
Neutro Abs: 3.7 10*3/uL (ref 1.7–7.7)
Neutrophils Relative %: 59 %
Platelets: 234 10*3/uL (ref 150–400)
RBC: 4.81 MIL/uL (ref 3.87–5.11)
RDW: 14.4 % (ref 11.5–15.5)
WBC: 6.1 10*3/uL (ref 4.0–10.5)
nRBC: 0 % (ref 0.0–0.2)

## 2020-01-28 LAB — VITAMIN D 25 HYDROXY (VIT D DEFICIENCY, FRACTURES): Vit D, 25-Hydroxy: 44.6 ng/mL (ref 30–100)

## 2020-01-28 LAB — LACTATE DEHYDROGENASE: LDH: 172 U/L (ref 98–192)

## 2020-01-28 LAB — IRON AND TIBC
Iron: 64 ug/dL (ref 28–170)
Saturation Ratios: 19 % (ref 10.4–31.8)
TIBC: 330 ug/dL (ref 250–450)
UIBC: 266 ug/dL

## 2020-01-28 LAB — VITAMIN B12: Vitamin B-12: 1408 pg/mL — ABNORMAL HIGH (ref 180–914)

## 2020-01-28 LAB — FERRITIN: Ferritin: 355 ng/mL — ABNORMAL HIGH (ref 11–307)

## 2020-01-29 ENCOUNTER — Other Ambulatory Visit: Payer: Self-pay

## 2020-01-29 ENCOUNTER — Inpatient Hospital Stay (HOSPITAL_BASED_OUTPATIENT_CLINIC_OR_DEPARTMENT_OTHER): Payer: Medicare HMO | Admitting: Oncology

## 2020-01-29 VITALS — HR 69 | Temp 97.2°F | Resp 21 | Wt 170.6 lb

## 2020-01-29 DIAGNOSIS — D500 Iron deficiency anemia secondary to blood loss (chronic): Secondary | ICD-10-CM

## 2020-01-29 DIAGNOSIS — M542 Cervicalgia: Secondary | ICD-10-CM | POA: Diagnosis not present

## 2020-01-29 DIAGNOSIS — R531 Weakness: Secondary | ICD-10-CM | POA: Diagnosis not present

## 2020-01-29 DIAGNOSIS — F17210 Nicotine dependence, cigarettes, uncomplicated: Secondary | ICD-10-CM | POA: Diagnosis not present

## 2020-01-29 DIAGNOSIS — D509 Iron deficiency anemia, unspecified: Secondary | ICD-10-CM

## 2020-01-29 DIAGNOSIS — E042 Nontoxic multinodular goiter: Secondary | ICD-10-CM | POA: Diagnosis not present

## 2020-01-29 DIAGNOSIS — M5442 Lumbago with sciatica, left side: Secondary | ICD-10-CM | POA: Diagnosis not present

## 2020-01-29 DIAGNOSIS — M1990 Unspecified osteoarthritis, unspecified site: Secondary | ICD-10-CM | POA: Diagnosis not present

## 2020-01-29 DIAGNOSIS — R944 Abnormal results of kidney function studies: Secondary | ICD-10-CM | POA: Diagnosis not present

## 2020-01-29 DIAGNOSIS — R5383 Other fatigue: Secondary | ICD-10-CM | POA: Diagnosis not present

## 2020-01-29 NOTE — Progress Notes (Signed)
[facility] Follow up:    [name], MD [address], [address] [place] [address]   DIAGNOSIS: Microcytic anemia  CURRENT THERAPY: Intermittent iron infusions  INTERVAL HISTORY: [name] 69 y.o. female returns for microcytic anemia. Patient reports she is doing well since her last visit.  She reports increased fatigue and weakness.  She denies any bright red bleeding per rectum or melena. She denies any easy bruising or bleeding. Denies any nausea, vomiting, or diarrhea. Denies any new pains. Had not noticed any recent bleeding such as epistaxis, hematuria or hematochezia. Denies recent chest pain on exertion, shortness of breath on minimal exertion, pre-syncopal episodes, or palpitations. Denies any numbness or tingling in hands or feet. Denies any recent fevers, infections, or recent hospitalizations. Patient reports appetite at 75% and energy level at 25%.    Patient Active Problem List   Diagnosis Date Noted  . Tubular adenoma of colon 02/09/2019  . Benign neoplasm of cerebral meninges ([place]) 11/12/2018  . Benign meningioma of brain ([place]) 10/31/2018  . Osteoarthritis of both hips 07/06/2018  . Lipoma of extremity 12/31/2017  . Impingement syndrome of left shoulder region 12/27/2017  . Leg weakness, bilateral 10/28/2017  . Lumbar spondylosis with myelopathy 10/12/2017  . Numbness of hand 10/10/2017  . Chronic neck pain 07/25/2017  . Chronic pain syndrome 07/25/2017  . At risk for cardiovascular event 06/10/2017  . Diabetic polyneuropathy associated with type 2 diabetes mellitus ([place]) 05/19/2016  . Tobacco user 04/24/2016  . Obesity (BMI 30.0-34.9) 04/24/2016  . History of palpitations 08/09/2015  . Nausea 08/09/2015  . Labile hypertension 08/03/2015  . Normal coronary arteries 08/03/2015  . Left-sided low back pain with left-sided sciatica 06/27/2015  . Multinodular goiter 05/06/2015  . Rectocele, female 04/27/2015  . Anal sphincter  incontinence 04/27/2015  . Pelvic relaxation due to rectocele 03/30/2015  . Pulmonary hypertension ([place]) 02/22/2015  . Light cigarette smoker (1-9 cigarettes per day) 01/11/2015  . Migraine without aura and without status migrainosus, not intractable 07/02/2014  . Flatulence 02/18/2014  . Microcytic anemia 02/18/2014  . COPD mixed type ([place]) 09/16/2013  . Hypothyroidism 08/16/2013  . Gastroparesis 04/28/2013  . Seizure disorder ([place]) 01/19/2013  . Cervical disc disorder with radiculopathy of cervical region 10/31/2012  . Solitary pulmonary nodule 08/19/2012  . Anemia 07/05/2012  . 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 Meningioma ([place]) 11/19/2011  . 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 Carpal tunnel syndrome of right wrist 05/23/2011  . Chronic pain of right hand 05/04/2011  . Polypharmacy 04/28/2011  . Bipolar disorder ([place]) 04/28/2011  . Constipation 04/13/2011  . Recurrent falls 12/12/2010  . Oropharyngeal dysphagia 07/12/2010  . Urinary incontinence 12/16/2009  . HEARING LOSS 10/26/2009  . Well controlled type 2 diabetes mellitus with gastroparesis ([place]) 07/07/2009  . Hyperlipidemia 12/11/2008  . IBS 12/11/2008  . GERD 07/29/2008  . MILK PRODUCTS ALLERGY 07/29/2008  . 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 Essential hypertension 06/27/2007  . Backache 06/19/2007  . Osteoporosis 06/19/2007  . Obstructive sleep apnea 06/19/2007  . TRIGGER FINGER 04/18/2007  . DIVERTICULOSIS, COLON 11/13/2006    is allergic to cephalexin, iron, milk-related compounds, penicillins, and phenazopyridine hcl.  MEDICAL HISTORY: Past Medical History:  Diagnosis Date  . Allergy   . Anemia   . Anxiety    takes Ativan daily  . Arthritis   . Assistance needed for mobility   . Bipolar disorder ([place])  takes Risperdal nightly  . Blood transfusion   . Brain tumor ([place])   . Cancer ([place])    In her gum  . Carpal tunnel syndrome  of right wrist 05/23/2011  . Cervical disc disorder with radiculopathy of cervical region 10/31/2012  . Chronic back pain   . Chronic idiopathic constipation   . Chronic neck and back pain   . Colon polyps   . COPD (chronic obstructive pulmonary disease) with chronic bronchitis ([place]) 09/16/2013   Office Spirometry 10/30/2013-submaximal effort based on appearance of loop and curve. Numbers would fit with severe restriction but her physiologic capability may be better than this. FVC 0.91/44%, and 10.74/45%, FEV1/FVC 0.81, FEF 25-75% 1.43/69%    . Diabetes mellitus    Type II  . Diverticulosis    TCS 9/08 by Dr. Delfin Edis for diarrhea . Bx for micro scopic colitis negative.   . Fibromyalgia   . Frequent falls   . GERD (gastroesophageal reflux disease)    takes Aciphex daily  . Glaucoma    eye drops daily  . Gum symptoms    infection on antibiotic  . Hemiplegia affecting non-dominant side, post-stroke 08/02/2011  . Hiatal hernia   . Hyperlipidemia    takes Crestor daily  . Hypertension    takes Amlodipine,Metoprolol,and Clonidine daily  . Hypothyroidism    takes Synthroid daily  . IBS (irritable bowel syndrome)   . Insomnia    takes Trazodone nightly  . Major depression, recurrent ([place])    takes Zoloft daily  . 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 Metabolic encephalopathy 5/39/7673  . Migraines    chronic headaches  . Mononeuritis lower limb   . Narcolepsy   . Osteoporosis   . Pancreatitis 2006   due to Depakote with normal EUS   . Schatzki's ring    non critical / EGD with ED 8/2011with RMR  . Seizures ([place])    takes Lamictal daily.Last seizure 3 yrs ago  . Sleep apnea    on CPAP  . Small bowel obstruction ([place])   . Stroke [facility])    left sided weakness, speech changes  . Tubular adenoma of colon     SURGICAL HISTORY: Past Surgical History:  Procedure Laterality Date  . ABDOMINAL HYSTERECTOMY  1978  . BACK SURGERY  July 2012  . BACTERIAL OVERGROWTH TEST N/A 05/05/2013    Procedure: BACTERIAL OVERGROWTH TEST;  Surgeon: Daneil Dolin, MD;  [facility];  Service: Endoscopy;  Laterality: N/A;  7:30  . BIOPSY THYROID  2009  . BRAIN SURGERY  11/2011   resection of meningioma  . BREAST REDUCTION SURGERY  1994  . CARDIAC CATHETERIZATION  05/10/2005   normal coronaries, normal LV systolic function and EF (Dr. Jackie Plum)  . CARPAL TUNNEL RELEASE Left 07/22/04   Dr. Aline Brochure  . CATARACT EXTRACTION Bilateral   . CHOLECYSTECTOMY  1984  . COLONOSCOPY N/A 09/25/2012   ALP:FXTKWIO diverticulosis.  colonic polyp-removed : tubular adenoma  . CRANIOTOMY  11/23/2011   Procedure: CRANIOTOMY TUMOR EXCISION;  Surgeon: Hosie Spangle, MD;  Location: [facility];  Service: Neurosurgery;  Laterality: N/A;  Craniotomy for tumor resection  . ESOPHAGOGASTRODUODENOSCOPY  12/29/2010   Rourk-Retained food in the esophagus and stomach, small hiatal hernia, status post Maloney dilation of the esophagus  . ESOPHAGOGASTRODUODENOSCOPY N/A 09/25/2012   XBD:ZHGDJMEQ atonic baggy esophagus status post Maloney dilation 65 F. Hiatal hernia  . GIVENS CAPSULE STUDY N/A 01/15/2013   NORMAL.   . IR GENERIC HISTORICAL  03/17/2016   IR RADIOLOGIST EVAL & MGMT 03/17/2016 [facility]INTER[facility] RAD  . LESION REMOVAL N/A 05/31/2015   Procedure: REMOVAL RIGHT AND LEFT LESIONS OF MANDIBLE;  Surgeon: Diona Browner, DDS;  Location: [facility];  Service: Oral Surgery;  Laterality: N/A;  . MALONEY DILATION  12/29/2010   RMR;  . NM MYOCAR PERF WALL MOTION  2006   "relavtiely normal" persantine, mild anterior thinning (breast attenuation artifact), no region of scar/ischemia  . OVARIAN CYST REMOVAL    . RECTOCELE REPAIR N/A 06/29/2015   Procedure: POSTERIOR REPAIR (RECTOCELE);  Surgeon: Jonnie Kind, MD;  Location: [facility];  Service: Gynecology;  Laterality: N/A;  . REDUCTION MAMMAPLASTY Bilateral   . SPINE SURGERY  09/29/2010   Dr. Rolena Infante  . surgical excision of 3 tumors from right thigh and right buttock  and left  upper thigh  2010  . TOOTH EXTRACTION Bilateral 12/14/2014   Procedure: REMOVAL OF BILATERAL MANDIBULAR EXOSTOSES;  Surgeon: Diona Browner, DDS;  Location: [facility];  Service: Oral Surgery;  Laterality: Bilateral;  . TRANSTHORACIC ECHOCARDIOGRAM  2010   EF 60-65%, mild conc LVH, grade 1 diastolic dysfunction; mildly calcified MV annulus with mildly thickened leaflets, mildly calcified MR annulus    SOCIAL HISTORY: Social History   Socioeconomic History  . Marital status: Divorced    Spouse name: Not on file  . Number of children: 1  . Years of education: 81  . Highest education level: High school graduate  Occupational History  . Occupation: Disabled  Tobacco Use  . Smoking status: Current Every Day Smoker    Packs/day: 0.50    Years: 30.00    Pack years: 15.00    Types: Cigarettes  . Smokeless tobacco: Never Used  . Tobacco comment: 5-10 cigs a day  Vaping Use  . Vaping Use: Never used  Substance and Sexual Activity  . Alcohol use: No    Alcohol/week: 0.0 standard drinks    Comment:    . Drug use: No  . Sexual activity: Not Currently  Other Topics Concern  . Not on file  Social History Narrative   01/29/18 Lives alone, has 3 aides, Mon- Fri 8 hrs, 2 hrs on Sat-Sun, RN manages her meds   Caffeine use: Drink coffee sometimes    Right handed    Social Determinants of Health   Financial Resource Strain: Low Risk   . Difficulty of Paying Living Expenses: Not very hard  Food Insecurity: No Food Insecurity  . Worried About Charity fundraiser in the Last Year: Never true  . Ran Out of Food in the Last Year: Never true  Transportation Needs: No Transportation Needs  . Lack of Transportation (Medical): No  . Lack of Transportation (Non-Medical): No  Physical Activity: Inactive  . Days of Exercise per Week: 0 days  . Minutes of Exercise per Session: 0 min  Stress: Stress Concern Present  . Feeling of Stress : To some extent  Social Connections: Moderately Isolated  .  Frequency of Communication with Friends and Family: More than three times a week  . Frequency of Social Gatherings with Friends and Family: More than three times a week  . Attends Religious Services: More than 4 times per year  . Active Member of Clubs or Organizations: No  . Attends Archivist Meetings: Never  . Marital Status: Divorced  Human resources officer Violence: Not At Risk  . Fear of Current or Ex-Partner: No  . Emotionally Abused: No  . Physically Abused: No  .  Sexually Abused: No    FAMILY HISTORY: Family History  Problem Relation Age of Onset  . Heart attack Mother        HTN  . Pneumonia Father   . Kidney failure Father   . Diabetes Father   . Pancreatic cancer Sister   . Diabetes Brother   . Hypertension Brother   . Diabetes Brother   . Cancer Sister        breast   . Hypertension Son   . Sleep apnea Son   . Cancer Sister        pancreatic  . Stroke Maternal Grandmother   . Heart attack Maternal Grandfather   . Alcohol abuse Maternal Uncle   . Colon cancer Neg Hx   . Anesthesia problems Neg Hx   . Hypotension Neg Hx   . Malignant hyperthermia Neg Hx   . Pseudochol deficiency Neg Hx   . Breast cancer Neg Hx     Review of Systems  Constitutional: Positive for fatigue.  All other systems reviewed and are negative.   Physical Exam Constitutional:      Appearance: Normal appearance.  HENT:     Head: Normocephalic and atraumatic.  Eyes:     Pupils: Pupils are equal, round, and reactive to light.  Cardiovascular:     Rate and Rhythm: Normal rate and regular rhythm.     Heart sounds: Normal heart sounds. No murmur heard.   Pulmonary:     Effort: Pulmonary effort is normal.     Breath sounds: Normal breath sounds. No wheezing.  Abdominal:     General: Bowel sounds are normal. There is no distension.     Palpations: Abdomen is soft.     Tenderness: There is no abdominal tenderness.  Musculoskeletal:        General: Normal range of motion.      Cervical back: Normal range of motion.  Skin:    General: Skin is warm and dry.     Findings: No rash.  Neurological:     Mental Status: She is alert and oriented to person, place, and time.  Psychiatric:        Judgment: Judgment normal.      LABORATORY DATA:  CBC    Component Value Date/Time   WBC 6.1 01/28/2020 1224   RBC 4.81 01/28/2020 1224   HGB 10.9 (L) 01/28/2020 1224   HGB 10.3 (L) 01/29/2018 1315   HCT 38.8 01/28/2020 1224   HCT 33.8 (L) 01/29/2018 1315   PLT 234 01/28/2020 1224   PLT 275 01/29/2018 1315   MCV 80.7 01/28/2020 1224   MCV 75 (L) 01/29/2018 1315   MCH 22.7 (L) 01/28/2020 1224   MCHC 28.1 (L) 01/28/2020 1224   RDW 14.4 01/28/2020 1224   RDW 12.8 01/29/2018 1315   LYMPHSABS 1.6 01/28/2020 1224   LYMPHSABS 2.4 01/29/2018 1315   MONOABS 0.6 01/28/2020 1224   EOSABS 0.2 01/28/2020 1224   EOSABS 0.2 01/29/2018 1315   BASOSABS 0.0 01/28/2020 1224   BASOSABS 0.0 01/29/2018 1315    CMP     Component Value Date/Time   NA 141 01/28/2020 1224   NA 146 (H) 12/11/2019 1224   K 4.1 01/28/2020 1224   CL 108 01/28/2020 1224   CO2 25 01/28/2020 1224   GLUCOSE 85 01/28/2020 1224   BUN 30 (H) 01/28/2020 1224   BUN 23 12/11/2019 1224   CREATININE 1.09 (H) 01/28/2020 1224   CREATININE 1.24 (H) 09/30/2018 1143   CALCIUM  9.3 01/28/2020 1224   PROT 7.1 01/28/2020 1224   PROT 6.8 02/12/2019 1150   ALBUMIN 4.0 01/28/2020 1224   ALBUMIN 4.3 02/12/2019 1150   AST 20 01/28/2020 1224   ALT 20 01/28/2020 1224   ALKPHOS 91 01/28/2020 1224   BILITOT 0.5 01/28/2020 1224   BILITOT 0.2 02/12/2019 1150   GFRNONAA 55 (L) 01/28/2020 1224   GFRNONAA 45 (L) 09/30/2018 1143   GFRAA 72 12/11/2019 1224   GFRAA 52 (L) 09/30/2018 1143    All questions were answered to patient's stated satisfaction. Encouraged patient to call with any new concerns or questions before his next visit to the cancer center and we can certain see him sooner, if needed.    ASSESSMENT and  THERAPY PLAN:  1.  Microcytic anemia:  -This is a combination anemia from iron deficiency and chronic kidney disease. -Colonoscopy on 02/10/2016 showing 2 sessile polyps in the descending colon, diverticula in the sigmoid colon. -EGD done on 02/10/2016 showing mild to moderate esophagitis, erythema in the inferior gastric antrum. - She was placed on iron pills, but could not tolerate them due to constipation. -She denies any bright red bleeding per rectum or melena. -Was followed by ENT for recurrent epistaxis.  No recent nosebleeds. - She last received IV Feraheme on 06/25/2019 and 07/04/2019.  She did experience some nausea and itching after her last treatment the premedication helps this. -Lab work from 01/28/2020 showed a hemoglobin of 10.9, ferritin 355, saturation ratio is 19% and platelets 234,000. -Given drop in iron saturation and ferritin levels coupled with extreme fatigue would recommend 2 doses of IV iron. -She will follow-up in 3 months with repeat labs.  2.  Elevated creatinine: -Creatinine 1.09 with a BUN of 30. -Recommend increased fluid intake. -We will continue to monitor.  Disposition: -RTC for 2 doses of IV Feraheme. -RTC in 3 months with repeat lab work(CBC, CMP, iron, LDH and ferritin).  No problem-specific Assessment & Plan notes found for this encounter.  No orders of the defined types were placed in this encounter.   All questions were answered. The patient knows to call the clinic with any problems, questions or concerns. We can certainly see the patient much sooner if necessary. This note was electronically signed.  Greater than 50% was spent in counseling and coordination of care with this patient including but not limited to discussion of the relevant topics above (See A&P) including, but not limited to diagnosis and management of acute and chronic medical conditions.    Jacquelin Hawking, NP 02/04/2020

## 2020-02-02 DIAGNOSIS — M160 Bilateral primary osteoarthritis of hip: Secondary | ICD-10-CM | POA: Diagnosis not present

## 2020-02-02 DIAGNOSIS — E1142 Type 2 diabetes mellitus with diabetic polyneuropathy: Secondary | ICD-10-CM | POA: Diagnosis not present

## 2020-02-02 DIAGNOSIS — E039 Hypothyroidism, unspecified: Secondary | ICD-10-CM | POA: Diagnosis not present

## 2020-02-02 DIAGNOSIS — F17210 Nicotine dependence, cigarettes, uncomplicated: Secondary | ICD-10-CM | POA: Diagnosis not present

## 2020-02-02 DIAGNOSIS — R2681 Unsteadiness on feet: Secondary | ICD-10-CM | POA: Diagnosis not present

## 2020-02-02 DIAGNOSIS — J42 Unspecified chronic bronchitis: Secondary | ICD-10-CM | POA: Diagnosis not present

## 2020-02-02 DIAGNOSIS — F250 Schizoaffective disorder, bipolar type: Secondary | ICD-10-CM | POA: Diagnosis not present

## 2020-02-02 DIAGNOSIS — E1165 Type 2 diabetes mellitus with hyperglycemia: Secondary | ICD-10-CM | POA: Diagnosis not present

## 2020-02-02 DIAGNOSIS — J449 Chronic obstructive pulmonary disease, unspecified: Secondary | ICD-10-CM | POA: Diagnosis not present

## 2020-02-03 ENCOUNTER — Ambulatory Visit (HOSPITAL_COMMUNITY): Payer: Medicare HMO

## 2020-02-03 DIAGNOSIS — M19012 Primary osteoarthritis, left shoulder: Secondary | ICD-10-CM | POA: Diagnosis not present

## 2020-02-03 DIAGNOSIS — M25512 Pain in left shoulder: Secondary | ICD-10-CM | POA: Diagnosis not present

## 2020-02-03 DIAGNOSIS — M5030 Other cervical disc degeneration, unspecified cervical region: Secondary | ICD-10-CM | POA: Diagnosis not present

## 2020-02-04 ENCOUNTER — Ambulatory Visit: Payer: Medicare HMO | Admitting: Adult Health

## 2020-02-06 DIAGNOSIS — M160 Bilateral primary osteoarthritis of hip: Secondary | ICD-10-CM | POA: Diagnosis not present

## 2020-02-06 DIAGNOSIS — E1165 Type 2 diabetes mellitus with hyperglycemia: Secondary | ICD-10-CM | POA: Diagnosis not present

## 2020-02-06 DIAGNOSIS — J449 Chronic obstructive pulmonary disease, unspecified: Secondary | ICD-10-CM | POA: Diagnosis not present

## 2020-02-06 DIAGNOSIS — R2681 Unsteadiness on feet: Secondary | ICD-10-CM | POA: Diagnosis not present

## 2020-02-06 DIAGNOSIS — F17210 Nicotine dependence, cigarettes, uncomplicated: Secondary | ICD-10-CM | POA: Diagnosis not present

## 2020-02-06 DIAGNOSIS — J42 Unspecified chronic bronchitis: Secondary | ICD-10-CM | POA: Diagnosis not present

## 2020-02-06 DIAGNOSIS — F250 Schizoaffective disorder, bipolar type: Secondary | ICD-10-CM | POA: Diagnosis not present

## 2020-02-06 DIAGNOSIS — E039 Hypothyroidism, unspecified: Secondary | ICD-10-CM | POA: Diagnosis not present

## 2020-02-06 DIAGNOSIS — E1142 Type 2 diabetes mellitus with diabetic polyneuropathy: Secondary | ICD-10-CM | POA: Diagnosis not present

## 2020-02-09 ENCOUNTER — Other Ambulatory Visit: Payer: Self-pay | Admitting: Family Medicine

## 2020-02-09 ENCOUNTER — Other Ambulatory Visit: Payer: Self-pay

## 2020-02-09 ENCOUNTER — Encounter: Payer: Self-pay | Admitting: Family Medicine

## 2020-02-09 ENCOUNTER — Ambulatory Visit (INDEPENDENT_AMBULATORY_CARE_PROVIDER_SITE_OTHER): Payer: Medicare HMO | Admitting: Family Medicine

## 2020-02-09 VITALS — BP 130/70 | HR 65 | Resp 16 | Ht 59.0 in | Wt 170.1 lb

## 2020-02-09 DIAGNOSIS — Z720 Tobacco use: Secondary | ICD-10-CM

## 2020-02-09 DIAGNOSIS — E785 Hyperlipidemia, unspecified: Secondary | ICD-10-CM

## 2020-02-09 DIAGNOSIS — I10 Essential (primary) hypertension: Secondary | ICD-10-CM

## 2020-02-09 DIAGNOSIS — Z0000 Encounter for general adult medical examination without abnormal findings: Secondary | ICD-10-CM

## 2020-02-09 MED ORDER — NYSTATIN 100000 UNIT/GM EX POWD: CUTANEOUS | 1 refills | Status: DC

## 2020-02-09 NOTE — Patient Instructions (Addendum)
F/u in office with MD in 5 months, call if you need me before  No medication changes.  Please get fasting lipid, cmp and eGFR, early January.  Blood pressure is excellent also blood sugar and cholesterol   Now smoking less cigarettes around 7 /day, I believe that you wILL quit. Please call [phone] 24/ 7 GET HELP WITH QUITTING  It is important that you exercise regularly at least 30 minutes 5 times a week. If you develop chest pain, have severe difficulty breathing, or feel very tired, stop exercising immediately and seek medical attention    Thanks for choosing [facility], we consider it a privelige to serve you.  

## 2020-02-09 NOTE — Progress Notes (Signed)
    [name]     MRN: [id number]      DOB: 1950/04/05  HPI: Patient is in for annual physical exam. Smoking cessation is discussed Recent labs,  are reviewed. Immunization is reviewed , and  Is up to date   PE: BP 130/70   Pulse 65   Resp 16   Ht 4\' 11"  (1.499 m)   Wt 170 lb 1.9 oz (77.2 kg)   SpO2 99%   BMI 34.36 kg/m   Pleasant  female, alert and oriented x 3, in no cardio-pulmonary distress. Afebrile. HEENT No facial trauma or asymetry. Sinuses non tender.  Extra occullar muscles intact.. External ears normal, . Neck: decreased ROM no adenopathy,JVD or thyromegaly.No bruits.  Chest: Clear to ascultation bilaterally.No crackles or wheezes. Non tender to palpation  Breast: Normal mammogram in 11/2019  Cardiovascular system; Heart sounds normal,  S1 and  S2 ,no S3.  No murmur, or thrill. Apical beat not displaced Peripheral pulses normal.  Abdomen: Soft, non tender,  No guarding, tenderness or rebound.    Musculoskeletal exam: Decreased  ROM of spine, hips , shoulders and knees.  deformity ,swelling and  crepitus noted.  muscle wasting noted, no atrophy.   Neurologic: Cranial nerves 2 to 12 intact. Power, tone ,sensation and reflexes normal throughout.  disturbance in gait. No tremor.  Skin: Intact, no ulceration, erythema , scaling or rash noted. Pigmentation normal throughout  Psych; Normal mood and affect. Judgement and concentration normal   Assessment & Plan:  Annual physical exam Annual exam as documented. Counseling done  re healthy lifestyle involving commitment to 150 minutes exercise per week, heart healthy diet, and attaining healthy weight.The importance of adequate sleep also discussed. Regular seat belt use and home safety, is also discussed. Changes in health habits are decided on by the patient with goals and time frames  set for achieving them. Immunization and cancer screening needs are specifically addressed at this visit.    Tobacco user Asked:confirms currently smokes cigarettes Assess: Unwilling to set a quit date, but is cutting back Advise: needs to QUIT to reduce risk of cancer, cardio and cerebrovascular disease Assist: counseled for 5 minutes and literature provided Arrange: follow up in 2 to 4 months

## 2020-02-10 ENCOUNTER — Ambulatory Visit (HOSPITAL_COMMUNITY)
Admission: RE | Admit: 2020-02-10 | Discharge: 2020-02-10 | Disposition: A | Discharge status: Home / Self Care | Payer: Medicare HMO | Source: Ambulatory Visit | Attending: Physician Assistant | Admitting: Physician Assistant

## 2020-02-10 DIAGNOSIS — F17210 Nicotine dependence, cigarettes, uncomplicated: Secondary | ICD-10-CM | POA: Diagnosis not present

## 2020-02-10 DIAGNOSIS — J42 Unspecified chronic bronchitis: Secondary | ICD-10-CM | POA: Diagnosis not present

## 2020-02-10 DIAGNOSIS — E1165 Type 2 diabetes mellitus with hyperglycemia: Secondary | ICD-10-CM | POA: Diagnosis not present

## 2020-02-10 DIAGNOSIS — R0989 Other specified symptoms and signs involving the circulatory and respiratory systems: Secondary | ICD-10-CM | POA: Diagnosis not present

## 2020-02-10 DIAGNOSIS — E039 Hypothyroidism, unspecified: Secondary | ICD-10-CM | POA: Diagnosis not present

## 2020-02-10 DIAGNOSIS — M160 Bilateral primary osteoarthritis of hip: Secondary | ICD-10-CM | POA: Diagnosis not present

## 2020-02-10 DIAGNOSIS — F250 Schizoaffective disorder, bipolar type: Secondary | ICD-10-CM | POA: Diagnosis not present

## 2020-02-10 DIAGNOSIS — R2681 Unsteadiness on feet: Secondary | ICD-10-CM | POA: Diagnosis not present

## 2020-02-10 DIAGNOSIS — E1142 Type 2 diabetes mellitus with diabetic polyneuropathy: Secondary | ICD-10-CM | POA: Diagnosis not present

## 2020-02-10 DIAGNOSIS — J449 Chronic obstructive pulmonary disease, unspecified: Secondary | ICD-10-CM | POA: Diagnosis not present

## 2020-02-10 DIAGNOSIS — I6523 Occlusion and stenosis of bilateral carotid arteries: Secondary | ICD-10-CM | POA: Diagnosis not present

## 2020-02-10 MED ORDER — NICOTINE POLACRILEX 4 MG MT LOZG: 4.0000 mg | LOZENGE | OROMUCOSAL | 0 refills | Status: DC | PRN

## 2020-02-11 DIAGNOSIS — J449 Chronic obstructive pulmonary disease, unspecified: Secondary | ICD-10-CM | POA: Diagnosis not present

## 2020-02-11 DIAGNOSIS — I10 Essential (primary) hypertension: Secondary | ICD-10-CM | POA: Diagnosis not present

## 2020-02-12 ENCOUNTER — Other Ambulatory Visit: Payer: Self-pay

## 2020-02-12 ENCOUNTER — Telehealth (HOSPITAL_COMMUNITY): Payer: Self-pay | Admitting: Psychiatry

## 2020-02-12 ENCOUNTER — Inpatient Hospital Stay (HOSPITAL_COMMUNITY): Payer: Medicare HMO | Attending: Hematology

## 2020-02-12 ENCOUNTER — Ambulatory Visit (HOSPITAL_COMMUNITY): Payer: Medicare HMO | Admitting: Psychiatry

## 2020-02-12 VITALS — BP 145/49 | HR 62 | Temp 97.0°F | Resp 18

## 2020-02-12 DIAGNOSIS — D509 Iron deficiency anemia, unspecified: Secondary | ICD-10-CM | POA: Insufficient documentation

## 2020-02-12 DIAGNOSIS — Z79899 Other long term (current) drug therapy: Secondary | ICD-10-CM | POA: Diagnosis not present

## 2020-02-12 MED ORDER — METHYLPREDNISOLONE SODIUM SUCC 125 MG IJ SOLR
INTRAMUSCULAR | Status: AC
  Filled 2020-02-12: qty 2

## 2020-02-12 MED ORDER — SODIUM CHLORIDE 0.9% FLUSH: 10.0000 mL | Freq: Once | INTRAVENOUS | Status: DC | PRN

## 2020-02-12 MED ORDER — SODIUM CHLORIDE 0.9 % IV SOLN: Freq: Once | INTRAVENOUS | Status: AC

## 2020-02-12 MED ORDER — ONDANSETRON HCL 4 MG/2ML IJ SOLN
4.0000 mg | Freq: Once | INTRAMUSCULAR | Status: AC
  Administered 2020-02-12: 4 mg via INTRAVENOUS

## 2020-02-12 MED ORDER — METHYLPREDNISOLONE SODIUM SUCC 125 MG IJ SOLR
125.0000 mg | Freq: Once | INTRAMUSCULAR | Status: AC
  Administered 2020-02-12: 125 mg via INTRAVENOUS

## 2020-02-12 MED ORDER — SODIUM CHLORIDE 0.9 % IV SOLN
510.0000 mg | Freq: Once | INTRAVENOUS | Status: AC
  Administered 2020-02-12: 510 mg via INTRAVENOUS
  Filled 2020-02-12: qty 510

## 2020-02-12 MED ORDER — ONDANSETRON HCL 4 MG/2ML IJ SOLN
INTRAMUSCULAR | Status: AC
  Filled 2020-02-12: qty 2

## 2020-02-12 NOTE — Progress Notes (Signed)
Patient presents today for Feraheme infusion.  Vital signs stable. No new complaints since last visit.  Feraheme infusion given today per MD orders.  Tolerated infusion without adverse affects.  Vital signs stable.  No complaints at this time.  Discharge from clinic ambulatory in stable condition.  Alert and oriented X 3.  Follow up with [facility] as scheduled.

## 2020-02-12 NOTE — Patient Instructions (Signed)
[facility] Discharge Instructions for Patients Receiving Chemotherapy  Today you received the following Feraheme  To help prevent nausea and vomiting after your treatment, we encourage you to take your nausea medication    If you develop nausea and vomiting that is not controlled by your nausea medication, call the clinic.   BELOW ARE SYMPTOMS THAT SHOULD BE REPORTED IMMEDIATELY:  *FEVER GREATER THAN 100.5 F  *CHILLS WITH OR WITHOUT FEVER  NAUSEA AND VOMITING THAT IS NOT CONTROLLED WITH YOUR NAUSEA MEDICATION  *UNUSUAL SHORTNESS OF BREATH  *UNUSUAL BRUISING OR BLEEDING  TENDERNESS IN MOUTH AND THROAT WITH OR WITHOUT PRESENCE OF ULCERS  *URINARY PROBLEMS  *BOWEL PROBLEMS  UNUSUAL RASH Items with * indicate a potential emergency and should be followed up as soon as possible.  Feel free to call the clinic should you have any questions or concerns. The clinic phone number is [phone].  Please show the [facility] at check-in to the Emergency Department and triage nurse.

## 2020-02-12 NOTE — Telephone Encounter (Signed)
Therapist attempted to contact patient via patient's home phone and cell phone and received voicemail messages. Therapist left a message indicating attempt and requesting patient call office.

## 2020-02-13 ENCOUNTER — Encounter (HOSPITAL_COMMUNITY): Payer: Self-pay | Admitting: Psychiatry

## 2020-02-13 ENCOUNTER — Telehealth (INDEPENDENT_AMBULATORY_CARE_PROVIDER_SITE_OTHER): Payer: Medicare HMO | Admitting: Psychiatry

## 2020-02-13 DIAGNOSIS — F331 Major depressive disorder, recurrent, moderate: Secondary | ICD-10-CM | POA: Diagnosis not present

## 2020-02-13 MED ORDER — LAMOTRIGINE 100 MG PO TABS
100.0000 mg | ORAL_TABLET | Freq: Two times a day (BID) | ORAL | 2 refills | Status: DC
Start: 2020-02-13 — End: 2020-04-19

## 2020-02-13 MED ORDER — TRAZODONE HCL 100 MG PO TABS
100.0000 mg | ORAL_TABLET | Freq: Every day | ORAL | 2 refills | Status: DC
Start: 2020-02-13 — End: 2020-04-19

## 2020-02-13 MED ORDER — SERTRALINE HCL 100 MG PO TABS
100.0000 mg | ORAL_TABLET | Freq: Every day | ORAL | 2 refills | Status: DC
Start: 2020-02-13 — End: 2020-04-19

## 2020-02-13 MED ORDER — LORAZEPAM 0.5 MG PO TABS: 0.5000 mg | ORAL_TABLET | Freq: Three times a day (TID) | ORAL | 2 refills | Status: DC

## 2020-02-13 MED ORDER — BUPROPION HCL ER (XL) 150 MG PO TB24: 150.0000 mg | ORAL_TABLET | ORAL | 2 refills | Status: DC

## 2020-02-13 MED ORDER — RISPERIDONE 0.5 MG PO TABS
0.5000 mg | ORAL_TABLET | Freq: Every day | ORAL | 2 refills | Status: DC
Start: 2020-02-13 — End: 2020-04-19

## 2020-02-13 NOTE — Progress Notes (Signed)
Virtual Visit via Telephone Note  I connected with [name] on 02/13/20 at 11:00 AM EST by telephone and verified that I am speaking with the correct person using two identifiers.  Location: Patient:home Provider: home   I discussed the limitations, risks, security and privacy concerns of performing an evaluation and management service by telephone and the availability of in person appointments. I also discussed with the patient that there may be a patient responsible charge related to this service. The patient expressed understanding and agreed to proceed.    I discussed the assessment and treatment plan with the patient. The patient was provided an opportunity to ask questions and all were answered. The patient agreed with the plan and demonstrated an understanding of the instructions.   The patient was advised to call back or seek an in-person evaluation if the symptoms worsen or if the condition fails to improve as anticipated.  I provided 15 minutes of non-face-to-face time during this encounter.   Levonne Spiller, MD  [facility] MD/PA/NP OP Progress Note  02/13/2020 11:16 AM [name]  MRN:  [id number]  Chief Complaint:  Chief Complaint    Depression; Anxiety; Follow-up     HPI: This patient is a 69 year old divorced black female who lives alone in [place].  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  The patient returns for follow-up after 3 months for history of depression and anxiety.  For the most part she states she is doing pretty well.  She still is suffering from chronic pain in her back neck and shoulder.  She is also getting iron infusions for low iron but her energy has not picked up that much.  Nevertheless she is happy to see her son every 2 weeks and she has a very good home health aide by her report.  For the most part she is sleeping well.  She denies serious mood swings depression anxiety panic attacks or suicidal ideation Visit  Diagnosis:    ICD-10-CM   1. Major depressive disorder, recurrent episode, moderate (HCC)  F33.1     Past Psychiatric History: Numerous hospitalizations for depression years ago including ECT treatment  Past Medical History:  Past Medical History:  Diagnosis Date  . Allergy   . Anemia   . Anxiety    takes Ativan daily  . Arthritis   . Assistance needed for mobility   . Bipolar disorder ([place])    takes Risperdal nightly  . Blood transfusion   . Brain tumor ([place])   . Cancer ([place])    In her gum  . Carpal tunnel syndrome of right wrist 05/23/2011  . Cervical disc disorder with radiculopathy of cervical region 10/31/2012  . Chronic back pain   . Chronic idiopathic constipation   . Chronic neck and back pain   . Colon polyps   . COPD (chronic obstructive pulmonary disease) with chronic bronchitis ([place]) 09/16/2013   Office Spirometry 10/30/2013-submaximal effort based on appearance of loop and curve. Numbers would fit with severe restriction but her physiologic capability may be better than this. FVC 0.91/44%, and 10.74/45%, FEV1/FVC 0.81, FEF 25-75% 1.43/69%    . Diabetes mellitus    Type II  . Diverticulosis    TCS 9/08 by Dr. Delfin Edis for diarrhea . Bx for micro scopic colitis negative.   . Fibromyalgia   . Frequent falls   . GERD (gastroesophageal reflux disease)    takes Aciphex daily  . Glaucoma  eye drops daily  . Gum symptoms    infection on antibiotic  . Hemiplegia affecting non-dominant side, post-stroke 08/02/2011  . Hiatal hernia   . Hyperlipidemia    takes Crestor daily  . Hypertension    takes Amlodipine,Metoprolol,and Clonidine daily  . Hypothyroidism    takes Synthroid daily  . IBS (irritable bowel syndrome)   . Insomnia    takes Trazodone nightly  . Major depression, recurrent ([place])    takes Zoloft daily  . 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 Metabolic encephalopathy 1/61/0960  . Migraines    chronic headaches  . Mononeuritis lower limb   .  Narcolepsy   . Osteoporosis   . Pancreatitis 2006   due to Depakote with normal EUS   . Schatzki's ring    non critical / EGD with ED 8/2011with RMR  . Seizures ([place])    takes Lamictal daily.Last seizure 3 yrs ago  . Sleep apnea    on CPAP  . Small bowel obstruction ([place])   . Stroke [facility])    left sided weakness, speech changes  . Tubular adenoma of colon     Past Surgical History:  Procedure Laterality Date  . ABDOMINAL HYSTERECTOMY  1978  . BACK SURGERY  July 2012  . BACTERIAL OVERGROWTH TEST N/A 05/05/2013   Procedure: BACTERIAL OVERGROWTH TEST;  Surgeon: Daneil Dolin, MD;  [facility];  Service: Endoscopy;  Laterality: N/A;  7:30  . BIOPSY THYROID  2009  . BRAIN SURGERY  11/2011   resection of meningioma  . BREAST REDUCTION SURGERY  1994  . CARDIAC CATHETERIZATION  05/10/2005   normal coronaries, normal LV systolic function and EF (Dr. Jackie Plum)  . CARPAL TUNNEL RELEASE Left 07/22/04   Dr. Aline Brochure  . CATARACT EXTRACTION Bilateral   . CHOLECYSTECTOMY  1984  . COLONOSCOPY N/A 09/25/2012   AVW:[id number] diverticulosis.  colonic polyp-removed : tubular adenoma  . CRANIOTOMY  11/23/2011   Procedure: CRANIOTOMY TUMOR EXCISION;  Surgeon: Hosie Spangle, MD;  Location: [facility];  Service: Neurosurgery;  Laterality: N/A;  Craniotomy for tumor resection  . ESOPHAGOGASTRODUODENOSCOPY  12/29/2010   Rourk-Retained food in the esophagus and stomach, small hiatal hernia, status post Maloney dilation of the esophagus  . ESOPHAGOGASTRODUODENOSCOPY N/A 09/25/2012   YNW:[id number] atonic baggy esophagus status post Maloney dilation 37 F. Hiatal hernia  . GIVENS CAPSULE STUDY N/A 01/15/2013   NORMAL.   . IR GENERIC HISTORICAL  03/17/2016   IR RADIOLOGIST EVAL & MGMT 03/17/2016 [facility]-INTERV RAD  . LESION REMOVAL N/A 05/31/2015   Procedure: REMOVAL RIGHT AND LEFT LESIONS OF MANDIBLE;  Surgeon: Diona Browner, DDS;  Location: [facility];  Service: Oral Surgery;  Laterality: N/A;  . MALONEY  DILATION  12/29/2010   RMR;  . NM MYOCAR PERF WALL MOTION  2006   "relavtiely normal" persantine, mild anterior thinning (breast attenuation artifact), no region of scar/ischemia  . OVARIAN CYST REMOVAL    . RECTOCELE REPAIR N/A 06/29/2015   Procedure: POSTERIOR REPAIR (RECTOCELE);  Surgeon: Jonnie Kind, MD;  Location: [facility];  Service: Gynecology;  Laterality: N/A;  . REDUCTION MAMMAPLASTY Bilateral   . SPINE SURGERY  09/29/2010   Dr. Rolena Infante  . surgical excision of 3 tumors from right thigh and right buttock  and left upper thigh  2010  . TOOTH EXTRACTION Bilateral 12/14/2014   Procedure: REMOVAL OF BILATERAL MANDIBULAR EXOSTOSES;  Surgeon: Diona Browner, DDS;  Location: [facility];  Service: Oral Surgery;  Laterality: Bilateral;  .  TRANSTHORACIC ECHOCARDIOGRAM  2010   EF 60-65%, mild conc LVH, grade 1 diastolic dysfunction; mildly calcified MV annulus with mildly thickened leaflets, mildly calcified MR annulus    Family Psychiatric History: see below  Family History:  Family History  Problem Relation Age of Onset  . Heart attack Mother        HTN  . Pneumonia Father   . Kidney failure Father   . Diabetes Father   . Pancreatic cancer Sister   . Diabetes Brother   . Hypertension Brother   . Diabetes Brother   . Cancer Sister        breast   . Hypertension Son   . Sleep apnea Son   . Cancer Sister        pancreatic  . Stroke Maternal Grandmother   . Heart attack Maternal Grandfather   . Alcohol abuse Maternal Uncle   . Colon cancer Neg Hx   . Anesthesia problems Neg Hx   . Hypotension Neg Hx   . Malignant hyperthermia Neg Hx   . Pseudochol deficiency Neg Hx   . Breast cancer Neg Hx     Social History:  Social History   Socioeconomic History  . Marital status: Divorced    Spouse name: Not on file  . Number of children: 1  . Years of education: 51  . Highest education level: High school graduate  Occupational History  . Occupation: Disabled  Tobacco Use  . Smoking  status: Current Every Day Smoker    Packs/day: 0.50    Years: 30.00    Pack years: 15.00    Types: Cigarettes  . Smokeless tobacco: Never Used  . Tobacco comment: 5-10 cigs a day  Vaping Use  . Vaping Use: Never used  Substance and Sexual Activity  . Alcohol use: No    Alcohol/week: 0.0 standard drinks    Comment:    . Drug use: No  . Sexual activity: Not Currently  Other Topics Concern  . Not on file  Social History Narrative   01/29/18 Lives alone, has 3 aides, Mon- Fri 8 hrs, 2 hrs on Sat-Sun, RN manages her meds   Caffeine use: Drink coffee sometimes    Right handed    Social Determinants of Health   Financial Resource Strain: Low Risk   . Difficulty of Paying Living Expenses: Not very hard  Food Insecurity: No Food Insecurity  . Worried About Charity fundraiser in the Last Year: Never true  . Ran Out of Food in the Last Year: Never true  Transportation Needs: No Transportation Needs  . Lack of Transportation (Medical): No  . Lack of Transportation (Non-Medical): No  Physical Activity: Inactive  . Days of Exercise per Week: 0 days  . Minutes of Exercise per Session: 0 min  Stress: Stress Concern Present  . Feeling of Stress : To some extent  Social Connections: Moderately Isolated  . Frequency of Communication with Friends and Family: More than three times a week  . Frequency of Social Gatherings with Friends and Family: More than three times a week  . Attends Religious Services: More than 4 times per year  . Active Member of Clubs or Organizations: No  . Attends Archivist Meetings: Never  . Marital Status: Divorced    Allergies:  Allergies  Allergen Reactions  . Cephalexin Hives  . Iron Nausea And Vomiting  . Milk-Related Compounds Other (See Comments)    Doesn't agree with stomach.   . Penicillins  Hives    Has patient had a PCN reaction causing immediate rash, facial/tongue/throat swelling, SOB or lightheadedness with hypotension: Yes Has  patient had a PCN reaction causing severe rash involving mucus membranes or skin necrosis: No Has patient had a PCN reaction that required hospitalization No Has patient had a PCN reaction occurring within the last 10 years: No If all of the above answers are "NO", then may proceed with Cephalosporin use.   [name] Phenazopyridine Hcl Hives          Metabolic Disorder Labs: Lab Results  Component Value Date   HGBA1C 5.9 (A) 11/06/2019   MPG 108 09/30/2018   MPG 134 10/15/2016   No results found for: PROLACTIN Lab Results  Component Value Date   CHOL 125 09/23/2019   TRIG 117 09/23/2019   HDL 45 09/23/2019   CHOLHDL 3.5 09/30/2018   VLDL 26 07/19/2015   LDLCALC 59 09/23/2019   LDLCALC 106 (H) 02/12/2019   Lab Results  Component Value Date   TSH 0.631 12/11/2019   TSH 0.94 02/17/2019    Therapeutic Level Labs: Lab Results  Component Value Date   LITHIUM <0.06 (L) 10/15/2016   Lab Results  Component Value Date   VALPROATE <10.0 (L) 08/23/2007   No components found for:  CBMZ  Current Medications: Current Outpatient Medications  Medication Sig Dispense Refill  . albuterol (VENTOLIN HFA) 108 (90 Base) MCG/ACT inhaler INHALE 1 TO 2 PUFFS EVERY 6 HOURS AS NEEDED FOR WHEEZING, SHORTNESS OF BREATH 1 each 3  . Alcohol Swabs (B-D SINGLE USE SWABS REGULAR) PADS USE TO CLEAN FINGER PRIOR TO STICKING FOR BLOOD SUGAR. 100 each 11  . alendronate (FOSAMAX) 70 MG tablet TAKE 1 TABLET (70 MG TOTAL) BY MOUTH EVERY 7 (SEVEN) DAYS. TAKE WITH A FULL GLASS OF WATER ON AN EMPTY STOMACH. 12 tablet 3  . amLODipine (NORVASC) 10 MG tablet TAKE 1 TABLET EVERY DAY 90 tablet 3  . ascorbic acid (VITAMIN C) 500 MG tablet Take 500 mg by mouth daily.    [name] aspirin EC 81 MG tablet Take 1 tablet (81 mg total) by mouth daily with breakfast. 120 tablet 2  . Blood Glucose Calibration (ACCU-CHEK GUIDE CONTROL) LIQD USE PRN TO CALIBRATE GLUCOMETER 3 each 11  . blood glucose meter kit and supplies Dispense based  on patient and insurance preference. Use up to four times daily as directed. (FOR ICD-10 E10.9, E11.9). 1 each 0  . buPROPion (WELLBUTRIN XL) 150 MG 24 hr tablet Take 1 tablet (150 mg total) by mouth every morning. 90 tablet 2  . cetirizine (ZYRTEC) 10 MG tablet Take 1 tablet (10 mg total) by mouth daily. 7 tablet 0  . clobetasol cream (TEMOVATE) 3.09 % Apply 1 application topically 2 (two) times daily.     . Continuous Blood Gluc Sensor (FREESTYLE LIBRE 14 DAY SENSOR) MISC 1 each by Does not apply route every 14 (fourteen) days. Change every 2 weeks 2 each 11  . cromolyn (NASALCROM) 5.2 MG/ACT nasal spray Place 1 spray into both nostrils 4 (four) times daily. 13 mL 1  . diclofenac Sodium (VOLTAREN) 1 % GEL APPLY 2 GRAMS  TOPICALLY 4 (FOUR) TIMES DAILY. 800 g 0  . dicyclomine (BENTYL) 10 MG capsule Take 1 capsule (10 mg total) by mouth 3 (three) times daily as needed for spasms. 90 capsule 2  . ezetimibe (ZETIA) 10 MG tablet TAKE 1 TABLET EVERY DAY 90 tablet 1  . glucose blood (ACCU-CHEK GUIDE) test strip USE TO  CHECK BLOOD SUGAR FOUR TIMES A DAY AND PRN 400 each 11  . Insulin Glargine, 2 Unit Dial, (TOUJEO MAX SOLOSTAR) 300 UNIT/ML SOPN Inject 25 Units into the skin at bedtime. 9 pen 3  . lamoTRIgine (LAMICTAL) 100 MG tablet Take 1 tablet (100 mg total) by mouth 2 (two) times daily. 180 tablet 2  . Lancets (ACCU-CHEK SOFT TOUCH) lancets Use as instructed to check blood sugar 3 times a day. 100 each 12  . levothyroxine (SYNTHROID) 50 MCG tablet TAKE 1 TABLET DAILY, EXCEPT TAKE 1/2 TABLET ON SUNDAY 85 tablet 1  . lidocaine (XYLOCAINE) 2 % solution RINSE WITH 15MLS AND SPIT OUT AFTER 30 SECONDS FOUR TIMES DAILY AS NEEDED FOR PAIN.    [name] LORazepam (ATIVAN) 0.5 MG tablet Take 1 tablet (0.5 mg total) by mouth 3 (three) times daily. 270 tablet 2  . losartan (COZAAR) 50 MG tablet TAKE 1 TABLET EVERY DAY 90 tablet 1  . metoprolol tartrate (LOPRESSOR) 50 MG tablet TAKE 1 TABLET TWICE DAILY (NEED MD  APPOINTMENT) 180 tablet 3  . montelukast (SINGULAIR) 10 MG tablet TAKE 1 TABLET EVERY DAY 90 tablet 1  . nicotine polacrilex (COMMIT) 4 MG lozenge Take 1 lozenge (4 mg total) by mouth as needed for smoking cessation. 100 tablet 0  . NOVOLOG FLEXPEN 100 UNIT/ML FlexPen INJECT 10 TO 14 UNITS INTO THE SKIN 3 (THREE) TIMES DAILY WITH MEALS. 45 mL 3  . nystatin (MYCOSTATIN/NYSTOP) powder APPLY TO AFFECTED AREA 4 TIMES DAILY. 90 g 1  . Omega-3 Fatty Acids (FISH OIL PO) Take 1 capsule by mouth daily.    . ondansetron (ZOFRAN) 4 MG tablet Take 1 tablet (4 mg total) by mouth every 8 (eight) hours as needed for nausea or vomiting. 20 tablet 0  . oxyCODONE-acetaminophen (PERCOCET) 10-325 MG tablet Take 1 tablet by mouth every 8 (eight) hours as needed.     [name] Plecanatide (TRULANCE) 3 MG TABS Take 3 mg by mouth daily. 30 tablet 3  . pregabalin (LYRICA) 75 MG capsule Take 1 capsule (75 mg total) by mouth daily.    . RABEprazole (ACIPHEX) 20 MG tablet TAKE 1 TABLET TWICE DAILY (NEED MD APPOINTMENT) 180 tablet 0  . risperiDONE (RISPERDAL) 0.5 MG tablet Take 1 tablet (0.5 mg total) by mouth at bedtime. 90 tablet 2  . rosuvastatin (CRESTOR) 5 MG tablet TAKE 1 TABLET AT BEDTIME 90 tablet 2  . sertraline (ZOLOFT) 100 MG tablet Take 1 tablet (100 mg total) by mouth daily. 90 tablet 2  . STIOLTO RESPIMAT 2.5-2.5 MCG/ACT AERS INHALE 2 PUFFS INTO THE LUNGS DAILY. 12 g 3  . traZODone (DESYREL) 100 MG tablet Take 1 tablet (100 mg total) by mouth at bedtime. 90 tablet 2  . UNABLE TO [facility] bed mattress x 1  DX: G47.33, J44.9 1 each 0   No current facility-administered medications for this visit.     Musculoskeletal: Strength & Muscle Tone: decreased Gait & Station: unsteady Patient leans: N/A  Psychiatric Specialty Exam: Review of Systems  Constitutional: Positive for fatigue.  Musculoskeletal: Positive for arthralgias, back pain, myalgias and neck pain.  All other systems reviewed and are negative.    There were no vitals taken for this visit.There is no height or weight on file to calculate BMI.  General Appearance: NA  Eye Contact:  NA  Speech:  Clear and Coherent  Volume:  Normal  Mood:  Euthymic  Affect:  NA  Thought Process:  Goal Directed  Orientation:  Full (Time,  Place, and Person)  Thought Content: Rumination   Suicidal Thoughts:  No  Homicidal Thoughts:  No  Memory:  Immediate;   Good Recent;   Good Remote;   Fair  Judgement:  Fair  Insight:  Fair  Psychomotor Activity:  Decreased  Concentration:  Concentration: Fair and Attention Span: Fair  Recall:  [employer] of Knowledge: Fair  Language: Good  Akathisia:  No  Handed:  Right  AIMS (if indicated): not done  Assets:  Communication Skills Desire for Improvement Resilience Social Support Talents/Skills  ADL's:  Intact  Cognition: WNL  Sleep:  Fair   Screenings: Mini-Mental     Office Visit from 02/03/2019 in [facility] Office Visit from 09/08/2015 in [facility] from 07/02/2014 in [facility] from 12/08/2013 in [facility]  Total Score (max 30 points ) _0 PHQ2-9     Office Visit from 02/09/2020 in [facility] Visit from 12/11/2019 in [facility] Visit from 10/09/2019 in [facility] Patient Outreach Telephone from 09/11/2019 in [facility] Visit from 08/20/2019 in [facility]  PHQ-2 Total Score 1 0 _1 PHQ-9 Total Score -- -- 4 -- --       Assessment and Plan: This patient is a 69 year old female with a history of depression and anxiety.  She seems to be doing well at this time on her current regimen.  She will continue Wellbutrin XL 150 mg daily for depression, Zoloft 100 mg daily for depression, trazodone 100 mg at bedtime for sleep, Ativan 0.5 mg 3 times daily for anxiety, Risperdal 0.5 mg at bedtime for mood stabilization and  Lamictal 100 mg twice daily for mood stabilization.  She'll return to see me in 3 months   Levonne Spiller, MD 02/13/2020, 11:16 AM

## 2020-02-14 ENCOUNTER — Encounter: Payer: Self-pay | Admitting: Family Medicine

## 2020-02-14 NOTE — Assessment & Plan Note (Signed)
Asked:confirms currently smokes cigarettes °Assess: Unwilling to set a quit date, but is cutting back °Advise: needs to QUIT to reduce risk of cancer, cardio and cerebrovascular disease °Assist: counseled for 5 minutes and literature provided °Arrange: follow up in 2 to 4 months ° °

## 2020-02-14 NOTE — Assessment & Plan Note (Signed)
Annual exam as documented. Counseling done  re healthy lifestyle involving commitment to 150 minutes exercise per week, heart healthy diet, and attaining healthy weight.The importance of adequate sleep also discussed. Regular seat belt use and home safety, is also discussed. Changes in health habits are decided on by the patient with goals and time frames  set for achieving them. Immunization and cancer screening needs are specifically addressed at this visit.  

## 2020-02-16 DIAGNOSIS — K589 Irritable bowel syndrome without diarrhea: Secondary | ICD-10-CM

## 2020-02-16 DIAGNOSIS — M160 Bilateral primary osteoarthritis of hip: Secondary | ICD-10-CM | POA: Diagnosis not present

## 2020-02-16 DIAGNOSIS — Z79899 Other long term (current) drug therapy: Secondary | ICD-10-CM

## 2020-02-16 DIAGNOSIS — E1142 Type 2 diabetes mellitus with diabetic polyneuropathy: Secondary | ICD-10-CM | POA: Diagnosis not present

## 2020-02-16 DIAGNOSIS — Z7982 Long term (current) use of aspirin: Secondary | ICD-10-CM

## 2020-02-16 DIAGNOSIS — F17210 Nicotine dependence, cigarettes, uncomplicated: Secondary | ICD-10-CM | POA: Diagnosis not present

## 2020-02-16 DIAGNOSIS — J449 Chronic obstructive pulmonary disease, unspecified: Secondary | ICD-10-CM | POA: Diagnosis not present

## 2020-02-16 DIAGNOSIS — K5730 Diverticulosis of large intestine without perforation or abscess without bleeding: Secondary | ICD-10-CM

## 2020-02-16 DIAGNOSIS — R2681 Unsteadiness on feet: Secondary | ICD-10-CM | POA: Diagnosis not present

## 2020-02-16 DIAGNOSIS — I10 Essential (primary) hypertension: Secondary | ICD-10-CM

## 2020-02-16 DIAGNOSIS — J42 Unspecified chronic bronchitis: Secondary | ICD-10-CM | POA: Diagnosis not present

## 2020-02-16 DIAGNOSIS — D649 Anemia, unspecified: Secondary | ICD-10-CM

## 2020-02-16 DIAGNOSIS — E1165 Type 2 diabetes mellitus with hyperglycemia: Secondary | ICD-10-CM | POA: Diagnosis not present

## 2020-02-16 DIAGNOSIS — Z794 Long term (current) use of insulin: Secondary | ICD-10-CM

## 2020-02-16 DIAGNOSIS — E039 Hypothyroidism, unspecified: Secondary | ICD-10-CM | POA: Diagnosis not present

## 2020-02-16 DIAGNOSIS — F250 Schizoaffective disorder, bipolar type: Secondary | ICD-10-CM | POA: Diagnosis not present

## 2020-02-16 DIAGNOSIS — R296 Repeated falls: Secondary | ICD-10-CM

## 2020-02-17 ENCOUNTER — Encounter: Payer: Self-pay | Admitting: Dermatology

## 2020-02-17 ENCOUNTER — Other Ambulatory Visit: Payer: Self-pay

## 2020-02-17 ENCOUNTER — Ambulatory Visit (INDEPENDENT_AMBULATORY_CARE_PROVIDER_SITE_OTHER): Payer: Medicare HMO | Admitting: Dermatology

## 2020-02-17 DIAGNOSIS — L219 Seborrheic dermatitis, unspecified: Secondary | ICD-10-CM

## 2020-02-17 DIAGNOSIS — L859 Epidermal thickening, unspecified: Secondary | ICD-10-CM

## 2020-02-17 MED ORDER — BETAMETHASONE DIPROPIONATE 0.05 % EX CREA: TOPICAL_CREAM | Freq: Two times a day (BID) | CUTANEOUS | 3 refills | Status: DC | PRN

## 2020-02-18 ENCOUNTER — Telehealth: Payer: Self-pay | Admitting: Internal Medicine

## 2020-02-19 ENCOUNTER — Other Ambulatory Visit: Payer: Self-pay

## 2020-02-19 ENCOUNTER — Inpatient Hospital Stay (HOSPITAL_COMMUNITY): Payer: Medicare HMO

## 2020-02-19 ENCOUNTER — Encounter: Payer: Self-pay | Admitting: Nurse Practitioner

## 2020-02-19 ENCOUNTER — Telehealth (INDEPENDENT_AMBULATORY_CARE_PROVIDER_SITE_OTHER): Payer: Medicare HMO | Admitting: Nurse Practitioner

## 2020-02-19 VITALS — BP 151/79 | HR 72 | Temp 97.5°F | Resp 18

## 2020-02-19 VITALS — BP 109/55 | HR 67

## 2020-02-19 DIAGNOSIS — Z79899 Other long term (current) drug therapy: Secondary | ICD-10-CM | POA: Diagnosis not present

## 2020-02-19 DIAGNOSIS — R42 Dizziness and giddiness: Secondary | ICD-10-CM | POA: Diagnosis not present

## 2020-02-19 DIAGNOSIS — D509 Iron deficiency anemia, unspecified: Secondary | ICD-10-CM

## 2020-02-19 DIAGNOSIS — R519 Headache, unspecified: Secondary | ICD-10-CM | POA: Insufficient documentation

## 2020-02-19 MED ORDER — MECLIZINE HCL 12.5 MG PO TABS: 25.0000 mg | ORAL_TABLET | Freq: Three times a day (TID) | ORAL | Status: DC | PRN

## 2020-02-19 MED ORDER — ONDANSETRON HCL 4 MG/2ML IJ SOLN
4.0000 mg | Freq: Once | INTRAMUSCULAR | Status: DC
  Filled 2020-02-19: qty 2

## 2020-02-19 MED ORDER — SODIUM CHLORIDE 0.9 % IV SOLN
510.0000 mg | Freq: Once | INTRAVENOUS | Status: AC
  Administered 2020-02-19: 510 mg via INTRAVENOUS
  Filled 2020-02-19: qty 510

## 2020-02-19 MED ORDER — METHYLPREDNISOLONE SODIUM SUCC 125 MG IJ SOLR
125.0000 mg | Freq: Once | INTRAMUSCULAR | Status: DC
  Filled 2020-02-19: qty 2

## 2020-02-19 MED ORDER — SODIUM CHLORIDE 0.9 % IV SOLN: Freq: Once | INTRAVENOUS | Status: AC

## 2020-02-19 MED ORDER — LORATADINE 10 MG PO TABS
10.0000 mg | ORAL_TABLET | Freq: Once | ORAL | Status: AC
  Administered 2020-02-19: 10 mg via ORAL
  Filled 2020-02-19: qty 1

## 2020-02-19 MED ORDER — ONDANSETRON HCL 4 MG/2ML IJ SOLN
4.0000 mg | Freq: Once | INTRAMUSCULAR | Status: AC
  Administered 2020-02-19: 4 mg via INTRAVENOUS

## 2020-02-19 NOTE — Progress Notes (Signed)
Patient presents today for Feraheme infusion.  Vital signs stable.  Patient expressed that she would like to skip her steroid pre-medication due to her being diabetic.  Explained to the patient that because she has had a reaction to iron in the past that she would probably need to continue to take the steroid, but that I would check with the doctor to see if there was something else she could take.  Messaged pharmacy and Dr. Lindi Adie and the steroid was discontinued and Claritin was ordered in place. No other complaints since last visit.   Feraheme infusion given today per MD orders.  Tolerated infusion without adverse affects.  Vital signs stable.  No complaints at this time.  Discharge from clinic ambulatory in stable condition.  Alert and oriented X 3.  Follow up with [facility] as scheduled.

## 2020-02-19 NOTE — Assessment & Plan Note (Signed)
-  unsure of etiology; exam limited d/t being a telephone visit -she has history of migraine, vertigo, and takes multiple BP meds -since she has dizziness with position changes, Rx. Meclizine -if no improvement, she knows that we will need an OV and we will collect orthostatic VS and perform vertigo maneuvers; at that time, may need to change BP meds if BP is still soft

## 2020-02-19 NOTE — Progress Notes (Addendum)
Acute Office Visit  Subjective:    Patient ID: [name], female    DOB: 1950-11-07, 69 y.o.   MRN: [id number]  Chief Complaint  Patient presents with  . Headache  . Dizziness    X 1 week     HPI Patient is in today for dizziness and headache that started about a week ago. She describes her dizziness as lightheadedness.  Her dizziness is worse when she stands up in the morning.  She states her headache goes from the front of her head to the back and down into her neck.  She has some photosensitivity at times, but this isn't consistent.  Past Medical History:  Diagnosis Date  . Allergy   . Anemia   . Anxiety    takes Ativan daily  . Arthritis   . Assistance needed for mobility   . Bipolar disorder ([place])    takes Risperdal nightly  . Blood transfusion   . Brain tumor ([place])   . Cancer ([place])    In her gum  . Carpal tunnel syndrome of right wrist 05/23/2011  . Cervical disc disorder with radiculopathy of cervical region 10/31/2012  . Chronic back pain   . Chronic idiopathic constipation   . Chronic neck and back pain   . Colon polyps   . COPD (chronic obstructive pulmonary disease) with chronic bronchitis ([place]) 09/16/2013   Office Spirometry 10/30/2013-submaximal effort based on appearance of loop and curve. Numbers would fit with severe restriction but her physiologic capability may be better than this. FVC 0.91/44%, and 10.74/45%, FEV1/FVC 0.81, FEF 25-75% 1.43/69%    . Diabetes mellitus    Type II  . Diverticulosis    TCS 9/08 by Dr. Delfin Edis for diarrhea . Bx for micro scopic colitis negative.   . Fibromyalgia   . Frequent falls   . GERD (gastroesophageal reflux disease)    takes Aciphex daily  . Glaucoma    eye drops daily  . Gum symptoms    infection on antibiotic  . Hemiplegia affecting non-dominant side, post-stroke 08/02/2011  . Hiatal hernia   . Hyperlipidemia    takes Crestor daily  . Hypertension    takes Amlodipine,Metoprolol,and Clonidine daily   . Hypothyroidism    takes Synthroid daily  . IBS (irritable bowel syndrome)   . Insomnia    takes Trazodone nightly  . Major depression, recurrent ([place])    takes Zoloft daily  . 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 Metabolic encephalopathy 2/53/6644  . Migraines    chronic headaches  . Mononeuritis lower limb   . Narcolepsy   . Osteoporosis   . Pancreatitis 2006   due to Depakote with normal EUS   . Schatzki's ring    non critical / EGD with ED 8/2011with RMR  . Seizures ([place])    takes Lamictal daily.Last seizure 3 yrs ago  . Sleep apnea    on CPAP  . Small bowel obstruction ([place])   . Stroke [facility])    left sided weakness, speech changes  . Tubular adenoma of colon     Past Surgical History:  Procedure Laterality Date  . ABDOMINAL HYSTERECTOMY  1978  . BACK SURGERY  July 2012  . BACTERIAL OVERGROWTH TEST N/A 05/05/2013   Procedure: BACTERIAL OVERGROWTH TEST;  Surgeon: Daneil Dolin, MD;  [facility];  Service: Endoscopy;  Laterality: N/A;  7:30  . BIOPSY THYROID  2009  . BRAIN SURGERY  11/2011   resection of meningioma  .  BREAST REDUCTION SURGERY  1994  . CARDIAC CATHETERIZATION  05/10/2005   normal coronaries, normal LV systolic function and EF (Dr. Jackie Plum)  . CARPAL TUNNEL RELEASE Left 07/22/04   Dr. Aline Brochure  . CATARACT EXTRACTION Bilateral   . CHOLECYSTECTOMY  1984  . COLONOSCOPY N/A 09/25/2012   DJT:[id number] diverticulosis.  colonic polyp-removed : tubular adenoma  . CRANIOTOMY  11/23/2011   Procedure: CRANIOTOMY TUMOR EXCISION;  Surgeon: Hosie Spangle, MD;  Location: [facility];  Service: Neurosurgery;  Laterality: N/A;  Craniotomy for tumor resection  . ESOPHAGOGASTRODUODENOSCOPY  12/29/2010   [place]k-Retained food in the esophagus and stomach, small hiatal hernia, status post Maloney dilation of the esophagus  . ESOPHAGOGASTRODUODENOSCOPY N/A 09/25/2012   JQZ:[id number] atonic baggy esophagus status post Maloney dilation 29 F. Hiatal hernia  .  GIVENS CAPSULE STUDY N/A 01/15/2013   NORMAL.   . IR GENERIC HISTORICAL  03/17/2016   IR RADIOLOGIST EVAL & MGMT 03/17/2016 [facility]  . LESION REMOVAL N/A 05/31/2015   Procedure: REMOVAL RIGHT AND LEFT LESIONS OF MANDIBLE;  Surgeon: Diona Browner, DDS;  Location: [facility];  Service: Oral Surgery;  Laterality: N/A;  . MALONEY DILATION  12/29/2010   RMR;  . NM MYOCAR PERF WALL MOTION  2006   "relavtiely normal" persantine, mild anterior thinning (breast attenuation artifact), no region of scar/ischemia  . OVARIAN CYST REMOVAL    . RECTOCELE REPAIR N/A 06/29/2015   Procedure: POSTERIOR REPAIR (RECTOCELE);  Surgeon: Jonnie Kind, MD;  Location: [facility];  Service: Gynecology;  Laterality: N/A;  . REDUCTION MAMMAPLASTY Bilateral   . SPINE SURGERY  09/29/2010   Dr. Rolena Infante  . surgical excision of 3 tumors from right thigh and right buttock  and left upper thigh  2010  . TOOTH EXTRACTION Bilateral 12/14/2014   Procedure: REMOVAL OF BILATERAL MANDIBULAR EXOSTOSES;  Surgeon: Diona Browner, DDS;  Location: [place];  Service: Oral Surgery;  Laterality: Bilateral;  . TRANSTHORACIC ECHOCARDIOGRAM  2010   EF 60-65%, mild conc LVH, grade 1 diastolic dysfunction; mildly calcified MV annulus with mildly thickened leaflets, mildly calcified MR annulus    Family History  Problem Relation Age of Onset  . Heart attack Mother        HTN  . Pneumonia Father   . Kidney failure Father   . Diabetes Father   . Pancreatic cancer Sister   . Diabetes Brother   . Hypertension Brother   . Diabetes Brother   . Cancer Sister        breast   . Hypertension Son   . Sleep apnea Son   . Cancer Sister        pancreatic  . Stroke Maternal Grandmother   . Heart attack Maternal Grandfather   . Alcohol abuse Maternal Uncle   . Colon cancer Neg Hx   . Anesthesia problems Neg Hx   . Hypotension Neg Hx   . Malignant hyperthermia Neg Hx   . Pseudochol deficiency Neg Hx   . Breast cancer Neg Hx     Social History    Socioeconomic History  . Marital status: Divorced    Spouse name: Not on file  . Number of children: 1  . Years of education: 85  . Highest education level: High school graduate  Occupational History  . Occupation: Disabled  Tobacco Use  . Smoking status: Current Every Day Smoker    Packs/day: 0.50    Years: 30.00    Pack years: 15.00    Types:  Cigarettes  . Smokeless tobacco: Never Used  . Tobacco comment: 5-10 cigs a day  Vaping Use  . Vaping Use: Never used  Substance and Sexual Activity  . Alcohol use: No    Alcohol/week: 0.0 standard drinks    Comment:    . Drug use: No  . Sexual activity: Not Currently  Other Topics Concern  . Not on file  Social History Narrative   01/29/18 Lives alone, has 3 aides, Mon- Fri 8 hrs, 2 hrs on Sat-Sun, RN manages her meds   Caffeine use: Drink coffee sometimes    Right handed    Social Determinants of Health   Financial Resource Strain: Low Risk   . Difficulty of Paying Living Expenses: Not very hard  Food Insecurity: No Food Insecurity  . Worried About Charity fundraiser in the Last Year: Never true  . Ran Out of Food in the Last Year: Never true  Transportation Needs: No Transportation Needs  . Lack of Transportation (Medical): No  . Lack of Transportation (Non-Medical): No  Physical Activity: Inactive  . Days of Exercise per Week: 0 days  . Minutes of Exercise per Session: 0 min  Stress: Stress Concern Present  . Feeling of Stress : To some extent  Social Connections: Moderately Isolated  . Frequency of Communication with Friends and Family: More than three times a week  . Frequency of Social Gatherings with Friends and Family: More than three times a week  . Attends Religious Services: More than 4 times per year  . Active Member of Clubs or Organizations: No  . Attends Archivist Meetings: Never  . Marital Status: Divorced  Human resources officer Violence: Not At Risk  . Fear of Current or Ex-Partner: No  .  Emotionally Abused: No  . Physically Abused: No  . Sexually Abused: No    Outpatient Medications Prior to Visit  Medication Sig Dispense Refill  . albuterol (VENTOLIN HFA) 108 (90 Base) MCG/ACT inhaler INHALE 1 TO 2 PUFFS EVERY 6 HOURS AS NEEDED FOR WHEEZING, SHORTNESS OF BREATH 1 each 3  . Alcohol Swabs (B-D SINGLE USE SWABS REGULAR) PADS USE TO CLEAN FINGER PRIOR TO STICKING FOR BLOOD SUGAR. 100 each 11  . alendronate (FOSAMAX) 70 MG tablet TAKE 1 TABLET (70 MG TOTAL) BY MOUTH EVERY 7 (SEVEN) DAYS. TAKE WITH A FULL GLASS OF WATER ON AN EMPTY STOMACH. 12 tablet 3  . amLODipine (NORVASC) 10 MG tablet TAKE 1 TABLET EVERY DAY 90 tablet 3  . ascorbic acid (VITAMIN C) 500 MG tablet Take 500 mg by mouth daily.    [name] aspirin EC 81 MG tablet Take 1 tablet (81 mg total) by mouth daily with breakfast. 120 tablet 2  . betamethasone dipropionate 0.05 % cream Apply topically 2 (two) times daily as needed (Rash). 45 g 3  . Blood Glucose Calibration (ACCU-CHEK GUIDE CONTROL) LIQD USE PRN TO CALIBRATE GLUCOMETER 3 each 11  . blood glucose meter kit and supplies Dispense based on patient and insurance preference. Use up to four times daily as directed. (FOR ICD-10 E10.9, E11.9). 1 each 0  . buPROPion (WELLBUTRIN XL) 150 MG 24 hr tablet Take 1 tablet (150 mg total) by mouth every morning. 90 tablet 2  . cetirizine (ZYRTEC) 10 MG tablet Take 1 tablet (10 mg total) by mouth daily. 7 tablet 0  . clobetasol cream (TEMOVATE) 6.07 % Apply 1 application topically 2 (two) times daily.     . Continuous Blood Gluc Sensor (FREESTYLE LIBRE  14 DAY SENSOR) MISC 1 each by Does not apply route every 14 (fourteen) days. Change every 2 weeks 2 each 11  . cromolyn (NASALCROM) 5.2 MG/ACT nasal spray Place 1 spray into both nostrils 4 (four) times daily. 13 mL 1  . diclofenac Sodium (VOLTAREN) 1 % GEL APPLY 2 GRAMS  TOPICALLY 4 (FOUR) TIMES DAILY. 800 g 0  . dicyclomine (BENTYL) 10 MG capsule Take 1 capsule (10 mg total) by mouth 3  (three) times daily as needed for spasms. 90 capsule 2  . ezetimibe (ZETIA) 10 MG tablet TAKE 1 TABLET EVERY DAY 90 tablet 1  . glucose blood (ACCU-CHEK GUIDE) test strip USE TO CHECK BLOOD SUGAR FOUR TIMES A DAY AND PRN 400 each 11  . Insulin Glargine, 2 Unit Dial, (TOUJEO MAX SOLOSTAR) 300 UNIT/ML SOPN Inject 25 Units into the skin at bedtime. 9 pen 3  . lamoTRIgine (LAMICTAL) 100 MG tablet Take 1 tablet (100 mg total) by mouth 2 (two) times daily. 180 tablet 2  . Lancets (ACCU-CHEK SOFT TOUCH) lancets Use as instructed to check blood sugar 3 times a day. 100 each 12  . levothyroxine (SYNTHROID) 50 MCG tablet TAKE 1 TABLET DAILY, EXCEPT TAKE 1/2 TABLET ON SUNDAY 85 tablet 1  . lidocaine (XYLOCAINE) 2 % solution RINSE WITH 15MLS AND SPIT OUT AFTER 30 SECONDS FOUR TIMES DAILY AS NEEDED FOR PAIN.    [name] LORazepam (ATIVAN) 0.5 MG tablet Take 1 tablet (0.5 mg total) by mouth 3 (three) times daily. 270 tablet 2  . losartan (COZAAR) 50 MG tablet TAKE 1 TABLET EVERY DAY 90 tablet 1  . metoprolol tartrate (LOPRESSOR) 50 MG tablet TAKE 1 TABLET TWICE DAILY (NEED MD APPOINTMENT) 180 tablet 3  . montelukast (SINGULAIR) 10 MG tablet TAKE 1 TABLET EVERY DAY 90 tablet 1  . nicotine polacrilex (COMMIT) 4 MG lozenge Take 1 lozenge (4 mg total) by mouth as needed for smoking cessation. 100 tablet 0  . NOVOLOG FLEXPEN 100 UNIT/ML FlexPen INJECT 10 TO 14 UNITS INTO THE SKIN 3 (THREE) TIMES DAILY WITH MEALS. 45 mL 3  . nystatin (MYCOSTATIN/NYSTOP) powder APPLY TO AFFECTED AREA 4 TIMES DAILY. 90 g 1  . Omega-3 Fatty Acids (FISH OIL PO) Take 1 capsule by mouth daily.    . ondansetron (ZOFRAN) 4 MG tablet Take 1 tablet (4 mg total) by mouth every 8 (eight) hours as needed for nausea or vomiting. 20 tablet 0  . oxyCODONE-acetaminophen (PERCOCET) 10-325 MG tablet Take 1 tablet by mouth every 8 (eight) hours as needed.     [name] Plecanatide (TRULANCE) 3 MG TABS Take 3 mg by mouth daily. 30 tablet 3  . pregabalin (LYRICA) 75 MG  capsule Take 1 capsule (75 mg total) by mouth daily.    . RABEprazole (ACIPHEX) 20 MG tablet TAKE 1 TABLET TWICE DAILY (NEED MD APPOINTMENT) 180 tablet 0  . risperiDONE (RISPERDAL) 0.5 MG tablet Take 1 tablet (0.5 mg total) by mouth at bedtime. 90 tablet 2  . rosuvastatin (CRESTOR) 5 MG tablet TAKE 1 TABLET AT BEDTIME 90 tablet 2  . sertraline (ZOLOFT) 100 MG tablet Take 1 tablet (100 mg total) by mouth daily. 90 tablet 2  . STIOLTO RESPIMAT 2.5-2.5 MCG/ACT AERS INHALE 2 PUFFS INTO THE LUNGS DAILY. 12 g 3  . traZODone (DESYREL) 100 MG tablet Take 1 tablet (100 mg total) by mouth at bedtime. 90 tablet 2  . UNABLE TO [facility] bed mattress x 1  DX: G47.33, J44.9 1 each 0  No facility-administered medications prior to visit.    Allergies  Allergen Reactions  . Cephalexin Hives  . Iron Nausea And Vomiting  . Milk-Related Compounds Other (See Comments)    Doesn't agree with stomach.   . Penicillins Hives    Has patient had a PCN reaction causing immediate rash, facial/tongue/throat swelling, SOB or lightheadedness with hypotension: Yes Has patient had a PCN reaction causing severe rash involving mucus membranes or skin necrosis: No Has patient had a PCN reaction that required hospitalization No Has patient had a PCN reaction occurring within the last 10 years: No If all of the above answers are "NO", then may proceed with Cephalosporin use.   [name] Phenazopyridine Hcl Hives          Review of Systems  Constitutional: Negative.   Respiratory: Negative.   Cardiovascular: Negative.   Neurological: Positive for dizziness and headaches. Negative for facial asymmetry, speech difficulty and numbness.       Objective:    Physical Exam  BP (!) 109/55   Pulse 67  Wt Readings from Last 3 Encounters:  02/09/20 170 lb 1.9 oz (77.2 kg)  01/29/20 170 lb 9.6 oz (77.4 kg)  01/27/20 170 lb 9.6 oz (77.4 kg)    Health Maintenance Due  Topic Date Due  . COVID-19 Vaccine (3 - Booster for  Moderna series) 11/25/2019    There are no preventive care reminders to display for this patient.   Lab Results  Component Value Date   TSH 0.631 12/11/2019   Lab Results  Component Value Date   WBC 6.1 01/28/2020   HGB 10.9 (L) 01/28/2020   HCT 38.8 01/28/2020   MCV 80.7 01/28/2020   PLT 234 01/28/2020   Lab Results  Component Value Date   NA 141 01/28/2020   K 4.1 01/28/2020   CO2 25 01/28/2020   GLUCOSE 85 01/28/2020   BUN 30 (H) 01/28/2020   CREATININE 1.09 (H) 01/28/2020   BILITOT 0.5 01/28/2020   ALKPHOS 91 01/28/2020   AST 20 01/28/2020   ALT 20 01/28/2020   PROT 7.1 01/28/2020   ALBUMIN 4.0 01/28/2020   CALCIUM 9.3 01/28/2020   ANIONGAP 8 01/28/2020   GFR 21.94 (L) 11/03/2014   Lab Results  Component Value Date   CHOL 125 09/23/2019   Lab Results  Component Value Date   HDL 45 09/23/2019   Lab Results  Component Value Date   LDLCALC 59 09/23/2019   Lab Results  Component Value Date   TRIG 117 09/23/2019   Lab Results  Component Value Date   CHOLHDL 3.5 09/30/2018   Lab Results  Component Value Date   HGBA1C 5.9 (A) 11/06/2019       Assessment & Plan:   Problem List Items Addressed This Visit      Other   Dizziness - Primary    -unsure of etiology; exam limited d/t being a telephone visit -she has history of migraine, vertigo, and takes multiple BP meds -since she has dizziness with position changes, Rx. Meclizine -if no improvement, she knows that we will need an OV and we will collect orthostatic VS and perform vertigo maneuvers; at that time, may need to change BP meds if BP is still soft      Acute nonintractable headache    -on-and-off for about a week -she takes narcotics for pain relief -if no improvement with meclizine, will consider med changes        Date:  02/19/20    Location  of Patient: Home Location of Provider: Office Consent was obtain for visit to be over via telehealth. I verified that I am speaking with  the correct person using two identifiers.  I connected with  Neko Mcgeehan Dorr on 02/19/20 via telephone and verified that I am speaking with the correct person using two identifiers.   I discussed the limitations of evaluation and management by telemedicine. The patient expressed understanding and agreed to proceed.  Time spent: 15 min  No orders of the defined types were placed in this encounter.    Noreene Larsson, NP

## 2020-02-19 NOTE — Patient Instructions (Signed)
[facility] Discharge Instructions for Patients   Today you received the following [place]  To help prevent nausea and vomiting after your treatment, we encourage you to take your nausea medication    If you develop nausea and vomiting that is not controlled by your nausea medication, call the clinic.   BELOW ARE SYMPTOMS THAT SHOULD BE REPORTED IMMEDIATELY:  *FEVER GREATER THAN 100.5 F  *CHILLS WITH OR WITHOUT FEVER  NAUSEA AND VOMITING THAT IS NOT CONTROLLED WITH YOUR NAUSEA MEDICATION  *UNUSUAL SHORTNESS OF BREATH  *UNUSUAL BRUISING OR BLEEDING  TENDERNESS IN MOUTH AND THROAT WITH OR WITHOUT PRESENCE OF ULCERS  *URINARY PROBLEMS  *BOWEL PROBLEMS  UNUSUAL RASH Items with * indicate a potential emergency and should be followed up as soon as possible.  Feel free to call the clinic should you have any questions or concerns. The clinic phone number is [phone].  Please show the [facility] at check-in to the Emergency Department and triage nurse.

## 2020-02-19 NOTE — Assessment & Plan Note (Signed)
-  on-and-off for about a week -she takes narcotics for pain relief -if no improvement with meclizine, will consider med changes

## 2020-02-20 ENCOUNTER — Emergency Department (HOSPITAL_COMMUNITY): Payer: Medicare HMO

## 2020-02-20 ENCOUNTER — Encounter: Payer: Self-pay | Admitting: Emergency Medicine

## 2020-02-20 ENCOUNTER — Encounter (HOSPITAL_COMMUNITY): Payer: Self-pay | Admitting: Emergency Medicine

## 2020-02-20 ENCOUNTER — Other Ambulatory Visit: Payer: Self-pay

## 2020-02-20 ENCOUNTER — Ambulatory Visit (INDEPENDENT_AMBULATORY_CARE_PROVIDER_SITE_OTHER): Payer: Medicare HMO

## 2020-02-20 ENCOUNTER — Emergency Department (HOSPITAL_COMMUNITY)
Admission: EM | Admit: 2020-02-20 | Discharge: 2020-02-20 | Disposition: A | Discharge status: Home / Self Care | Payer: Medicare HMO | Attending: Emergency Medicine | Admitting: Emergency Medicine

## 2020-02-20 ENCOUNTER — Ambulatory Visit (INDEPENDENT_AMBULATORY_CARE_PROVIDER_SITE_OTHER)
Admission: EM | Admit: 2020-02-20 | Discharge: 2020-02-20 | Disposition: A | Discharge status: Home / Self Care | Payer: Medicare HMO | Source: Home / Self Care

## 2020-02-20 ENCOUNTER — Ambulatory Visit: Payer: Medicare HMO | Admitting: Internal Medicine

## 2020-02-20 DIAGNOSIS — Z794 Long term (current) use of insulin: Secondary | ICD-10-CM | POA: Diagnosis not present

## 2020-02-20 DIAGNOSIS — R404 Transient alteration of awareness: Secondary | ICD-10-CM

## 2020-02-20 DIAGNOSIS — M7989 Other specified soft tissue disorders: Secondary | ICD-10-CM | POA: Diagnosis not present

## 2020-02-20 DIAGNOSIS — R0902 Hypoxemia: Secondary | ICD-10-CM | POA: Insufficient documentation

## 2020-02-20 DIAGNOSIS — E1142 Type 2 diabetes mellitus with diabetic polyneuropathy: Secondary | ICD-10-CM | POA: Insufficient documentation

## 2020-02-20 DIAGNOSIS — E1139 Type 2 diabetes mellitus with other diabetic ophthalmic complication: Secondary | ICD-10-CM | POA: Diagnosis not present

## 2020-02-20 DIAGNOSIS — Z7982 Long term (current) use of aspirin: Secondary | ICD-10-CM | POA: Insufficient documentation

## 2020-02-20 DIAGNOSIS — Z79899 Other long term (current) drug therapy: Secondary | ICD-10-CM | POA: Diagnosis not present

## 2020-02-20 DIAGNOSIS — E860 Dehydration: Secondary | ICD-10-CM | POA: Diagnosis not present

## 2020-02-20 DIAGNOSIS — I700 Atherosclerosis of aorta: Secondary | ICD-10-CM | POA: Diagnosis not present

## 2020-02-20 DIAGNOSIS — M25471 Effusion, right ankle: Secondary | ICD-10-CM | POA: Diagnosis not present

## 2020-02-20 DIAGNOSIS — E039 Hypothyroidism, unspecified: Secondary | ICD-10-CM | POA: Diagnosis not present

## 2020-02-20 DIAGNOSIS — F17210 Nicotine dependence, cigarettes, uncomplicated: Secondary | ICD-10-CM | POA: Insufficient documentation

## 2020-02-20 DIAGNOSIS — J449 Chronic obstructive pulmonary disease, unspecified: Secondary | ICD-10-CM | POA: Insufficient documentation

## 2020-02-20 DIAGNOSIS — E785 Hyperlipidemia, unspecified: Secondary | ICD-10-CM | POA: Insufficient documentation

## 2020-02-20 DIAGNOSIS — I10 Essential (primary) hypertension: Secondary | ICD-10-CM | POA: Diagnosis not present

## 2020-02-20 DIAGNOSIS — S82831A Other fracture of upper and lower end of right fibula, initial encounter for closed fracture: Secondary | ICD-10-CM | POA: Insufficient documentation

## 2020-02-20 DIAGNOSIS — W19XXXA Unspecified fall, initial encounter: Secondary | ICD-10-CM | POA: Diagnosis not present

## 2020-02-20 DIAGNOSIS — M84471A Pathological fracture, right ankle, initial encounter for fracture: Secondary | ICD-10-CM | POA: Diagnosis not present

## 2020-02-20 DIAGNOSIS — R4182 Altered mental status, unspecified: Secondary | ICD-10-CM

## 2020-02-20 DIAGNOSIS — T402X1A Poisoning by other opioids, accidental (unintentional), initial encounter: Secondary | ICD-10-CM | POA: Insufficient documentation

## 2020-02-20 DIAGNOSIS — T40601A Poisoning by unspecified narcotics, accidental (unintentional), initial encounter: Secondary | ICD-10-CM

## 2020-02-20 DIAGNOSIS — R5383 Other fatigue: Secondary | ICD-10-CM | POA: Diagnosis not present

## 2020-02-20 DIAGNOSIS — E1169 Type 2 diabetes mellitus with other specified complication: Secondary | ICD-10-CM | POA: Diagnosis not present

## 2020-02-20 DIAGNOSIS — R400 Somnolence: Secondary | ICD-10-CM | POA: Diagnosis not present

## 2020-02-20 DIAGNOSIS — M25571 Pain in right ankle and joints of right foot: Secondary | ICD-10-CM

## 2020-02-20 DIAGNOSIS — S82454A Nondisplaced comminuted fracture of shaft of right fibula, initial encounter for closed fracture: Secondary | ICD-10-CM | POA: Diagnosis not present

## 2020-02-20 LAB — CBC WITH DIFFERENTIAL/PLATELET
Abs Immature Granulocytes: 0.06 10*3/uL (ref 0.00–0.07)
Basophils Absolute: 0 10*3/uL (ref 0.0–0.1)
Basophils Relative: 0 %
Eosinophils Absolute: 0.3 10*3/uL (ref 0.0–0.5)
Eosinophils Relative: 3 %
HCT: 36.4 % (ref 36.0–46.0)
Hemoglobin: 10.2 g/dL — ABNORMAL LOW (ref 12.0–15.0)
Immature Granulocytes: 1 %
Lymphocytes Relative: 20 %
Lymphs Abs: 1.8 10*3/uL (ref 0.7–4.0)
MCH: 22.7 pg — ABNORMAL LOW (ref 26.0–34.0)
MCHC: 28 g/dL — ABNORMAL LOW (ref 30.0–36.0)
MCV: 81.1 fL (ref 80.0–100.0)
Monocytes Absolute: 1 10*3/uL (ref 0.1–1.0)
Monocytes Relative: 11 %
Neutro Abs: 5.9 10*3/uL (ref 1.7–7.7)
Neutrophils Relative %: 65 %
Platelets: 223 10*3/uL (ref 150–400)
RBC: 4.49 MIL/uL (ref 3.87–5.11)
RDW: 14.7 % (ref 11.5–15.5)
WBC: 9.1 10*3/uL (ref 4.0–10.5)
nRBC: 0 % (ref 0.0–0.2)

## 2020-02-20 LAB — URINALYSIS, ROUTINE W REFLEX MICROSCOPIC
Bilirubin Urine: NEGATIVE
Glucose, UA: NEGATIVE mg/dL
Hgb urine dipstick: NEGATIVE
Ketones, ur: NEGATIVE mg/dL
Leukocytes,Ua: NEGATIVE
Nitrite: NEGATIVE
Protein, ur: NEGATIVE mg/dL
Specific Gravity, Urine: 1.018 (ref 1.005–1.030)
pH: 5 (ref 5.0–8.0)

## 2020-02-20 LAB — COMPREHENSIVE METABOLIC PANEL
ALT: 24 U/L (ref 0–44)
AST: 21 U/L (ref 15–41)
Albumin: 3.8 g/dL (ref 3.5–5.0)
Alkaline Phosphatase: 88 U/L (ref 38–126)
Anion gap: 7 (ref 5–15)
BUN: 32 mg/dL — ABNORMAL HIGH (ref 8–23)
CO2: 25 mmol/L (ref 22–32)
Calcium: 8.6 mg/dL — ABNORMAL LOW (ref 8.9–10.3)
Chloride: 108 mmol/L (ref 98–111)
Creatinine, Ser: 1.5 mg/dL — ABNORMAL HIGH (ref 0.44–1.00)
GFR, Estimated: 37 mL/min — ABNORMAL LOW (ref >60)
Glucose, Bld: 108 mg/dL — ABNORMAL HIGH (ref 70–99)
Potassium: 4.3 mmol/L (ref 3.5–5.1)
Sodium: 140 mmol/L (ref 135–145)
Total Bilirubin: 0.6 mg/dL (ref 0.3–1.2)
Total Protein: 7 g/dL (ref 6.5–8.1)

## 2020-02-20 LAB — BLOOD GAS, ARTERIAL
Acid-base deficit: 2.3 mmol/L — ABNORMAL HIGH (ref 0.0–2.0)
Bicarbonate: 22 mmol/L (ref 20.0–28.0)
FIO2: 21
O2 Saturation: 94.7 %
Patient temperature: 36.7
pCO2 arterial: 47.8 mmHg (ref 32.0–48.0)
pH, Arterial: 7.305 — ABNORMAL LOW (ref 7.350–7.450)
pO2, Arterial: 79.7 mmHg — ABNORMAL LOW (ref 83.0–108.0)

## 2020-02-20 LAB — RAPID URINE DRUG SCREEN, HOSP PERFORMED
Amphetamines: NOT DETECTED
Barbiturates: NOT DETECTED
Benzodiazepines: POSITIVE — AB
Cocaine: NOT DETECTED
Opiates: POSITIVE — AB
Tetrahydrocannabinol: NOT DETECTED

## 2020-02-20 LAB — CBG MONITORING, ED: Glucose-Capillary: 123 mg/dL — ABNORMAL HIGH (ref 70–99)

## 2020-02-20 LAB — AMMONIA: Ammonia: 30 umol/L (ref 9–35)

## 2020-02-20 LAB — ETHANOL: Alcohol, Ethyl (B): <10 mg/dL (ref <10)

## 2020-02-20 MED ORDER — NALOXONE HCL 0.4 MG/ML IJ SOLN
0.4000 mg | Freq: Once | INTRAMUSCULAR | Status: AC
  Administered 2020-02-20: 0.4 mg via INTRAVENOUS
  Filled 2020-02-20: qty 1

## 2020-02-20 MED ORDER — LACTATED RINGERS IV BOLUS
1000.0000 mL | Freq: Once | INTRAVENOUS | Status: AC
  Administered 2020-02-20: 1000 mL via INTRAVENOUS

## 2020-02-20 MED ORDER — SODIUM CHLORIDE 0.9 % IV BOLUS
500.0000 mL | Freq: Once | INTRAVENOUS | Status: AC
  Administered 2020-02-20: 500 mL via INTRAVENOUS

## 2020-02-20 NOTE — ED Notes (Signed)
Pt more alert and talking, asking to go home.

## 2020-02-20 NOTE — Telephone Encounter (Signed)
Spoke to [facility] they are going to contact [employer] to see what the status is for the replacement machine lmom for pt to let her know this [name]

## 2020-02-20 NOTE — ED Provider Notes (Signed)
[facility] EMERGENCY DEPARTMENT Provider Note   CSN: [id number] Arrival date & time: 02/20/20  1424  LEVEL 5 CAVEAT - ALTERED MENTAL STATUS  History Chief Complaint  Patient presents with  . Altered Mental Status    [name] is a 69 y.o. female.  HPI 69 year old female presents with lethargy and fall. History is very limited and mostly obtained through chart review as the patient is so sleepy. Unfortunately chart review is also limited as the urgent care note is not filled out. It appears that she fell and suffered a nondisplaced distal fibula fracture on her right ankle. Patient cannot tell me how or when she fell though she does know she fell and injured her leg. Otherwise she knows she is in the [place]. Chart review indicates she had a tele-medicine visit yesterday for dizziness and headache, and was prescribed antivert. It is unclear if she has taken any.   Past Medical History:  Diagnosis Date  . Allergy   . Anemia   . Anxiety    takes Ativan daily  . Arthritis   . Assistance needed for mobility   . Bipolar disorder ([place])    takes Risperdal nightly  . Blood transfusion   . Brain tumor ([place])   . Cancer ([place])    In her gum  . Carpal tunnel syndrome of right wrist 05/23/2011  . Cervical disc disorder with radiculopathy of cervical region 10/31/2012  . Chronic back pain   . Chronic idiopathic constipation   . Chronic neck and back pain   . Colon polyps   . COPD (chronic obstructive pulmonary disease) with chronic bronchitis ([place]) 09/16/2013   Office Spirometry 10/30/2013-submaximal effort based on appearance of loop and curve. Numbers would fit with severe restriction but her physiologic capability may be better than this. FVC 0.91/44%, and 10.74/45%, FEV1/FVC 0.81, FEF 25-75% 1.43/69%    . Diabetes mellitus    Type II  . Diverticulosis    TCS 9/08 by Dr. Delfin Edis for diarrhea . Bx for micro scopic colitis negative.   . Fibromyalgia   . Frequent falls   .  GERD (gastroesophageal reflux disease)    takes Aciphex daily  . Glaucoma    eye drops daily  . Gum symptoms    infection on antibiotic  . Hemiplegia affecting non-dominant side, post-stroke 08/02/2011  . Hiatal hernia   . Hyperlipidemia    takes Crestor daily  . Hypertension    takes Amlodipine,Metoprolol,and Clonidine daily  . Hypothyroidism    takes Synthroid daily  . IBS (irritable bowel syndrome)   . Insomnia    takes Trazodone nightly  . Major depression, recurrent ([place])    takes Zoloft daily  . 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 Metabolic encephalopathy 06/12/270  . Migraines    chronic headaches  . Mononeuritis lower limb   . Narcolepsy   . Osteoporosis   . Pancreatitis 2006   due to Depakote with normal EUS   . Schatzki's ring    non critical / EGD with ED 8/2011with RMR  . Seizures ([place])    takes Lamictal daily.Last seizure 3 yrs ago  . Sleep apnea    on CPAP  . Small bowel obstruction ([place])   . Stroke [place])    left sided weakness, speech changes  . Tubular adenoma of colon     Patient Active Problem List   Diagnosis Date Noted  . Acute nonintractable headache 02/19/2020  . Tubular adenoma of colon 02/09/2019  .  Benign neoplasm of cerebral meninges ([place]) 11/12/2018  . Benign meningioma of brain ([place]) 10/31/2018  . Osteoarthritis of both hips 07/06/2018  . Lipoma of extremity 12/31/2017  . Impingement syndrome of left shoulder region 12/27/2017  . Leg weakness, bilateral 10/28/2017  . Lumbar spondylosis with myelopathy 10/12/2017  . Numbness of hand 10/10/2017  . Chronic neck pain 07/25/2017  . Chronic pain syndrome 07/25/2017  . At risk for cardiovascular event 06/10/2017  . Diabetic polyneuropathy associated with type 2 diabetes mellitus ([place]) 05/19/2016  . Tobacco user 04/24/2016  . Obesity (BMI 30.0-34.9) 04/24/2016  . History of palpitations 08/09/2015  . Nausea 08/09/2015  . Labile hypertension 08/03/2015  . Normal coronary arteries  08/03/2015  . Dizziness 07/15/2015  . Left-sided low back pain with left-sided sciatica 06/27/2015  . Multinodular goiter 05/06/2015  . Rectocele, female 04/27/2015  . Anal sphincter incontinence 04/27/2015  . Pelvic relaxation due to rectocele 03/30/2015  . Pulmonary hypertension ([place]) 02/22/2015  . Light cigarette smoker (1-9 cigarettes per day) 01/11/2015  . Annual physical exam 01/05/2015  . Migraine without aura and without status migrainosus, not intractable 07/02/2014  . Flatulence 02/18/2014  . Microcytic anemia 02/18/2014  . COPD mixed type ([place]) 09/16/2013  . Hypothyroidism 08/16/2013  . Gastroparesis 04/28/2013  . Seizure disorder ([place]) 01/19/2013  . Cervical disc disorder with radiculopathy of cervical region 10/31/2012  . Solitary pulmonary nodule 08/19/2012  . Anemia 07/05/2012  . 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 Meningioma ([place]) 11/19/2011  . 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 Carpal tunnel syndrome of right wrist 05/23/2011  . Chronic pain of right hand 05/04/2011  . Polypharmacy 04/28/2011  . Bipolar disorder ([place]) 04/28/2011  . Constipation 04/13/2011  . Recurrent falls 12/12/2010  . Oropharyngeal dysphagia 07/12/2010  . Urinary incontinence 12/16/2009  . HEARING LOSS 10/26/2009  . Well controlled type 2 diabetes mellitus with gastroparesis ([place]) 07/07/2009  . Hyperlipidemia 12/11/2008  . IBS 12/11/2008  . GERD 07/29/2008  . MILK PRODUCTS ALLERGY 07/29/2008  . 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 Essential hypertension 06/27/2007  . Backache 06/19/2007  . Osteoporosis 06/19/2007  . Obstructive sleep apnea 06/19/2007  . TRIGGER FINGER 04/18/2007  . DIVERTICULOSIS, COLON 11/13/2006    Past Surgical History:  Procedure Laterality Date  . ABDOMINAL HYSTERECTOMY  1978  . BACK SURGERY  July 2012  . BACTERIAL OVERGROWTH TEST N/A 05/05/2013   Procedure: BACTERIAL OVERGROWTH TEST;   Surgeon: Daneil Dolin, MD;  Location: [facility];  Service: Endoscopy;  Laterality: N/A;  7:30  . BIOPSY THYROID  2009  . BRAIN SURGERY  11/2011   resection of meningioma  . BREAST REDUCTION SURGERY  1994  . CARDIAC CATHETERIZATION  05/10/2005   normal coronaries, normal LV systolic function and EF (Dr. Jackie Plum)  . CARPAL TUNNEL RELEASE Left 07/22/04   Dr. Aline Brochure  . CATARACT EXTRACTION Bilateral   . CHOLECYSTECTOMY  1984  . COLONOSCOPY N/A 09/25/2012   UKG:URKYHCW diverticulosis.  colonic polyp-removed : tubular adenoma  . CRANIOTOMY  11/23/2011   Procedure: CRANIOTOMY TUMOR EXCISION;  Surgeon: Hosie Spangle, MD;  Location: [place] NEURO ORS;  Service: Neurosurgery;  Laterality: N/A;  Craniotomy for tumor resection  . ESOPHAGOGASTRODUODENOSCOPY  12/29/2010   [place]k-Retained food in the esophagus and stomach, small hiatal hernia, status post Maloney dilation of the esophagus  . ESOPHAGOGASTRODUODENOSCOPY N/A 09/25/2012   CBJ:[id number] atonic baggy esophagus status post Maloney dilation 89 F. Hiatal hernia  .  GIVENS CAPSULE STUDY N/A 01/15/2013   NORMAL.   . IR GENERIC HISTORICAL  03/17/2016   IR RADIOLOGIST EVAL & MGMT 03/17/2016 [facility]-INTERV RAD  . LESION REMOVAL N/A 05/31/2015   Procedure: REMOVAL RIGHT AND LEFT LESIONS OF MANDIBLE;  Surgeon: Diona Browner, DDS;  Location: [place];  Service: Oral Surgery;  Laterality: N/A;  . MALONEY DILATION  12/29/2010   RMR;  . NM MYOCAR PERF WALL MOTION  2006   "relavtiely normal" persantine, mild anterior thinning (breast attenuation artifact), no region of scar/ischemia  . OVARIAN CYST REMOVAL    . RECTOCELE REPAIR N/A 06/29/2015   Procedure: POSTERIOR REPAIR (RECTOCELE);  Surgeon: Jonnie Kind, MD;  Location: [facility] ORS;  Service: Gynecology;  Laterality: N/A;  . REDUCTION MAMMAPLASTY Bilateral   . SPINE SURGERY  09/29/2010   Dr. Rolena Infante  . surgical excision of 3 tumors from right thigh and right buttock  and left upper thigh  2010  . TOOTH EXTRACTION  Bilateral 12/14/2014   Procedure: REMOVAL OF BILATERAL MANDIBULAR EXOSTOSES;  Surgeon: Diona Browner, DDS;  Location: [place];  Service: Oral Surgery;  Laterality: Bilateral;  . TRANSTHORACIC ECHOCARDIOGRAM  2010   EF 60-65%, mild conc LVH, grade 1 diastolic dysfunction; mildly calcified MV annulus with mildly thickened leaflets, mildly calcified MR annulus     OB History    Gravida  6   Para  1   Term  1   Preterm      AB  5   Living        SAB  5   IAB      Ectopic      Multiple      Live Births              Family History  Problem Relation Age of Onset  . Heart attack Mother        HTN  . Pneumonia Father   . Kidney failure Father   . Diabetes Father   . Pancreatic cancer Sister   . Diabetes Brother   . Hypertension Brother   . Diabetes Brother   . Cancer Sister        breast   . Hypertension Son   . Sleep apnea Son   . Cancer Sister        pancreatic  . Stroke Maternal Grandmother   . Heart attack Maternal Grandfather   . Alcohol abuse Maternal Uncle   . Colon cancer Neg Hx   . Anesthesia problems Neg Hx   . Hypotension Neg Hx   . Malignant hyperthermia Neg Hx   . Pseudochol deficiency Neg Hx   . Breast cancer Neg Hx     Social History   Tobacco Use  . Smoking status: Current Every Day Smoker    Packs/day: 0.50    Years: 30.00    Pack years: 15.00    Types: Cigarettes  . Smokeless tobacco: Never Used  . Tobacco comment: 5-10 cigs a day  Vaping Use  . Vaping Use: Never used  Substance Use Topics  . Alcohol use: No    Alcohol/week: 0.0 standard drinks    Comment:    . Drug use: No    Home Medications Prior to Admission medications   Medication Sig Start Date End Date Taking? Authorizing Provider  albuterol (VENTOLIN HFA) 108 (90 Base) MCG/ACT inhaler INHALE 1 TO 2 PUFFS EVERY 6 HOURS AS NEEDED FOR WHEEZING, SHORTNESS OF BREATH 11/14/19   Deneise Lever, MD  Alcohol Swabs (  B-D SINGLE USE SWABS REGULAR) PADS USE TO CLEAN FINGER PRIOR  TO STICKING FOR BLOOD SUGAR. 08/12/19   Philemon Kingdom, MD  alendronate (FOSAMAX) 70 MG tablet TAKE 1 TABLET (70 MG TOTAL) BY MOUTH EVERY 7 (SEVEN) DAYS. TAKE WITH A FULL GLASS OF WATER ON AN EMPTY STOMACH. 02/09/20   [name], MD  amLODipine (NORVASC) 10 MG tablet TAKE 1 TABLET EVERY DAY 01/20/20   Imogene Burn, PA-C  ascorbic acid (VITAMIN C) 500 MG tablet Take 500 mg by mouth daily.    [provider]  aspirin EC 81 MG tablet Take 1 tablet (81 mg total) by mouth daily with breakfast. 12/08/18   Emokpae, Courage, MD  betamethasone dipropionate 0.05 % cream Apply topically 2 (two) times daily as needed (Rash). 02/17/20   Lavonna Monarch, MD  Blood Glucose Calibration (ACCU-CHEK GUIDE CONTROL) LIQD USE PRN TO CALIBRATE GLUCOMETER 08/12/19   Philemon Kingdom, MD  blood glucose meter kit and supplies Dispense based on patient and insurance preference. Use up to four times daily as directed. (FOR ICD-10 E10.9, E11.9). 06/27/19   Philemon Kingdom, MD  buPROPion (WELLBUTRIN XL) 150 MG 24 hr tablet Take 1 tablet (150 mg total) by mouth every morning. 02/13/20 02/12/21  Cloria Spring, MD  cetirizine (ZYRTEC) 10 MG tablet Take 1 tablet (10 mg total) by mouth daily. 07/30/17   [name], MD  clobetasol cream (TEMOVATE) 2.44 % Apply 1 application topically 2 (two) times daily.  07/30/18   [provider]  Continuous Blood Gluc Sensor (FREESTYLE LIBRE 14 DAY SENSOR) MISC 1 each by Does not apply route every 14 (fourteen) days. Change every 2 weeks 06/24/19   Philemon Kingdom, MD  cromolyn (NASALCROM) 5.2 MG/ACT nasal spray Place 1 spray into both nostrils 4 (four) times daily. 01/17/19   Perlie Mayo, NP  diclofenac Sodium (VOLTAREN) 1 % GEL APPLY 2 GRAMS  TOPICALLY 4 (FOUR) TIMES DAILY. 06/19/19   Esterwood, Amy S, PA-C  dicyclomine (BENTYL) 10 MG capsule Take 1 capsule (10 mg total) by mouth 3 (three) times daily as needed for spasms. 07/02/19   Pyrtle, Lajuan Lines, MD  ezetimibe  (ZETIA) 10 MG tablet TAKE 1 TABLET EVERY DAY 01/16/20   [name], MD  glucose blood (ACCU-CHEK GUIDE) test strip USE TO CHECK BLOOD SUGAR FOUR TIMES A DAY AND PRN 08/12/19   Philemon Kingdom, MD  Insulin Glargine, 2 Unit Dial, (TOUJEO MAX SOLOSTAR) 300 UNIT/ML SOPN Inject 25 Units into the skin at bedtime. 02/17/19   Philemon Kingdom, MD  lamoTRIgine (LAMICTAL) 100 MG tablet Take 1 tablet (100 mg total) by mouth 2 (two) times daily. 02/13/20   Cloria Spring, MD  Lancets (ACCU-CHEK SOFT [facility]) lancets Use as instructed to check blood sugar 3 times a day. 03/11/19   Philemon Kingdom, MD  levothyroxine (SYNTHROID) 50 MCG tablet TAKE 1 TABLET DAILY, EXCEPT TAKE 1/2 TABLET ON SUNDAY 12/22/19   [name], MD  lidocaine (XYLOCAINE) 2 % solution RINSE WITH 15MLS AND SPIT OUT AFTER 30 SECONDS FOUR TIMES DAILY AS NEEDED FOR PAIN. 08/04/19   [provider]  LORazepam (ATIVAN) 0.5 MG tablet Take 1 tablet (0.5 mg total) by mouth 3 (three) times daily. 02/13/20 02/12/21  Cloria Spring, MD  losartan (COZAAR) 50 MG tablet TAKE 1 TABLET EVERY DAY 12/22/19   [name], MD  metoprolol tartrate (LOPRESSOR) 50 MG tablet TAKE 1 TABLET TWICE DAILY (NEED MD APPOINTMENT) 11/10/19   Ahmed Prima [place]  M, PA-C  montelukast (SINGULAIR) 10 MG tablet TAKE 1 TABLET EVERY DAY 12/22/19   [name], MD  nicotine polacrilex (COMMIT) 4 MG lozenge Take 1 lozenge (4 mg total) by mouth as needed for smoking cessation. 02/10/20   [name], MD  NOVOLOG FLEXPEN 100 UNIT/ML FlexPen INJECT 10 TO 14 UNITS INTO THE SKIN 3 (THREE) TIMES DAILY WITH MEALS. 12/15/19   Philemon Kingdom, MD  nystatin (MYCOSTATIN/NYSTOP) powder APPLY TO AFFECTED AREA 4 TIMES DAILY. 02/09/20   [name], MD  Omega-3 Fatty Acids (FISH OIL PO) Take 1 capsule by mouth daily.    [provider]  ondansetron (ZOFRAN) 4 MG tablet Take 1 tablet (4 mg total) by mouth every 8 (eight) hours as needed for  nausea or vomiting. 12/11/18   [name], MD  oxyCODONE-acetaminophen (PERCOCET) 10-325 MG tablet Take 1 tablet by mouth every 8 (eight) hours as needed.  08/06/18   [provider]  Plecanatide (TRULANCE) 3 MG TABS Take 3 mg by mouth daily. 01/27/20   Pyrtle, Lajuan Lines, MD  pregabalin (LYRICA) 75 MG capsule Take 1 capsule (75 mg total) by mouth daily. 10/20/16   Rexene Alberts, MD  RABEprazole (ACIPHEX) 20 MG tablet TAKE 1 TABLET TWICE DAILY (NEED MD APPOINTMENT) 12/01/19   Pyrtle, Lajuan Lines, MD  risperiDONE (RISPERDAL) 0.5 MG tablet Take 1 tablet (0.5 mg total) by mouth at bedtime. 02/13/20   Cloria Spring, MD  rosuvastatin (CRESTOR) 5 MG tablet TAKE 1 TABLET AT BEDTIME 11/18/19   [name], MD  sertraline (ZOLOFT) 100 MG tablet Take 1 tablet (100 mg total) by mouth daily. 02/13/20   Cloria Spring, MD  STIOLTO RESPIMAT 2.5-2.5 MCG/ACT AERS INHALE 2 PUFFS INTO THE LUNGS DAILY. 04/11/19   Deneise Lever, MD  traZODone (DESYREL) 100 MG tablet Take 1 tablet (100 mg total) by mouth at bedtime. 02/13/20   Cloria Spring, MD  UNABLE TO [facility] bed mattress x 1  DX: G47.33, J44.9 10/16/19   [name], MD    Allergies    Cephalexin, Iron, Milk-related compounds, Penicillins, and Phenazopyridine hcl  Review of Systems   Review of Systems  Unable to perform ROS: Mental status change    Physical Exam Updated Vital Signs BP 110/70   Pulse 60   Temp 98 F (36.7 C) (Oral)   Resp 16   Ht 4' 11"  (1.499 m)   Wt 77.1 kg   SpO2 100%   BMI 34.34 kg/m   Physical Exam Vitals and nursing note reviewed.  Constitutional:      General: She is not in acute distress.    Appearance: She is well-developed and well-nourished. She is not ill-appearing or diaphoretic.  HENT:     Head: Normocephalic and atraumatic.     Right Ear: External ear normal.     Left Ear: External ear normal.     Nose: Nose normal.  Eyes:     General:        Right eye: No discharge.        Left  eye: No discharge.  Cardiovascular:     Rate and Rhythm: Normal rate and regular rhythm.     Pulses:          Dorsalis pedis pulses are 2+ on the right side.     Heart sounds: Normal heart sounds.  Pulmonary:     Effort: Pulmonary effort is normal.     Breath sounds: Normal breath  sounds.  Abdominal:     Palpations: Abdomen is soft.     Tenderness: There is no abdominal tenderness.  Musculoskeletal:     Right lower leg: No tenderness or bony tenderness.     Right ankle: Swelling present. Tenderness present over the lateral malleolus. Normal range of motion.  Skin:    General: Skin is warm and dry.  Neurological:     Mental Status: She is lethargic.     Comments: Patient is asleep. Awakens to voice and light stimulation, but falls asleep quickly. Will talk some, perhaps slurred speech. Otherwise, no facial droop. Equal strength in all 4 extremities.   Psychiatric:        Mood and Affect: Mood is not anxious.     ED Results / Procedures / Treatments   Labs (all labs ordered are listed, but only abnormal results are displayed) Labs Reviewed  COMPREHENSIVE METABOLIC PANEL - Abnormal; Notable for the following components:      Result Value   Glucose, Bld 108 (*)    BUN 32 (*)    Creatinine, Ser 1.50 (*)    Calcium 8.6 (*)    GFR, Estimated 37 (*)    All other components within normal limits  CBC WITH DIFFERENTIAL/PLATELET - Abnormal; Notable for the following components:   Hemoglobin 10.2 (*)    MCH 22.7 (*)    MCHC 28.0 (*)    All other components within normal limits  RAPID URINE DRUG SCREEN, HOSP PERFORMED - Abnormal; Notable for the following components:   Opiates POSITIVE (*)    Benzodiazepines POSITIVE (*)    All other components within normal limits  BLOOD GAS, ARTERIAL - Abnormal; Notable for the following components:   pH, Arterial 7.305 (*)    pO2, Arterial 79.7 (*)    Acid-base deficit 2.3 (*)    All other components within normal limits  CBG MONITORING, ED -  Abnormal; Notable for the following components:   Glucose-Capillary 123 (*)    All other components within normal limits  ETHANOL  URINALYSIS, ROUTINE W REFLEX MICROSCOPIC  [place]    EKG None  Radiology DG Chest 1 View  Result Date: 02/20/2020 CLINICAL DATA:  Altered mental status. EXAM: CHEST  1 VIEW COMPARISON:  January 09, 2019 FINDINGS: The heart size and mediastinal contours are within normal limits. Very mild calcification of the aortic arch is seen. Both lungs are clear. Degenerative changes are noted within the thoracic spine. Radiopaque surgical clips are seen overlying the right upper quadrant. IMPRESSION: No active disease. Electronically Signed   By: Virgina Norfolk M.D.   On: 02/20/2020 16:26   DG Ankle Complete Right  Result Date: 02/20/2020 CLINICAL DATA:  [place] last night.  Right ankle pain and swelling. EXAM: RIGHT ANKLE - COMPLETE 3+ VIEW COMPARISON:  None. FINDINGS: Oblique, nondisplaced, non comminuted fracture of the distal fibula extending from the posterolateral metadiaphysis to the medial metaphysis at the level of the ankle joint. No other fractures.  No bone lesions. Ankle joint normally spaced and aligned. Small plantar and dorsal calcaneal spurs. Soft tissue swelling that predominates laterally. IMPRESSION: 1. Nondisplaced, non comminuted fracture of the distal fibula. Normally aligned ankle joint. Electronically Signed   By: Lajean Manes M.D.   On: 02/20/2020 13:22   CT Head Wo Contrast  Result Date: 02/20/2020 CLINICAL DATA:  Altered mental status EXAM: CT HEAD WITHOUT CONTRAST CT CERVICAL SPINE WITHOUT CONTRAST TECHNIQUE: Multidetector CT imaging of the head and cervical spine was performed following the  standard protocol without intravenous contrast. Multiplanar CT image reconstructions of the cervical spine were also generated. COMPARISON:  05/01/2015 FINDINGS: CT HEAD FINDINGS Brain: No evidence of acute infarction, hemorrhage, hydrocephalus, extra-axial  collection or mass lesion/mass effect. There is chronic encephalomalacia of the left greater than right frontal poles with overlying craniotomy and clip material in the vicinity of the anterior cerebral arteries. Vascular: No hyperdense vessel or unexpected calcification. Skull: Status post frontal craniotomy. Negative for fracture or focal lesion. Sinuses/Orbits: No acute finding. Other: None. CT CERVICAL SPINE FINDINGS Alignment: Normal. Skull base and vertebrae: No acute fracture. No primary bone lesion or focal pathologic process. Soft tissues and spinal canal: No prevertebral fluid or swelling. No visible canal hematoma. Disc levels: Mild multilevel disc space height loss and osteophytosis. Upper chest: Negative. Other: None. IMPRESSION: 1. No acute intracranial pathology. 2. There is unchanged, chronic encephalomalacia of the left greater than right frontal poles with overlying craniotomy and clip material in the vicinity of the anterior cerebral arteries. 3. No fracture or static subluxation of the cervical spine. Electronically Signed   By: Eddie Candle M.D.   On: 02/20/2020 16:32   CT Cervical Spine Wo Contrast  Result Date: 02/20/2020 CLINICAL DATA:  Altered mental status EXAM: CT HEAD WITHOUT CONTRAST CT CERVICAL SPINE WITHOUT CONTRAST TECHNIQUE: Multidetector CT imaging of the head and cervical spine was performed following the standard protocol without intravenous contrast. Multiplanar CT image reconstructions of the cervical spine were also generated. COMPARISON:  05/01/2015 FINDINGS: CT HEAD FINDINGS Brain: No evidence of acute infarction, hemorrhage, hydrocephalus, extra-axial collection or mass lesion/mass effect. There is chronic encephalomalacia of the left greater than right frontal poles with overlying craniotomy and clip material in the vicinity of the anterior cerebral arteries. Vascular: No hyperdense vessel or unexpected calcification. Skull: Status post frontal craniotomy. Negative for  fracture or focal lesion. Sinuses/Orbits: No acute finding. Other: None. CT CERVICAL SPINE FINDINGS Alignment: Normal. Skull base and vertebrae: No acute fracture. No primary bone lesion or focal pathologic process. Soft tissues and spinal canal: No prevertebral fluid or swelling. No visible canal hematoma. Disc levels: Mild multilevel disc space height loss and osteophytosis. Upper chest: Negative. Other: None. IMPRESSION: 1. No acute intracranial pathology. 2. There is unchanged, chronic encephalomalacia of the left greater than right frontal poles with overlying craniotomy and clip material in the vicinity of the anterior cerebral arteries. 3. No fracture or static subluxation of the cervical spine. Electronically Signed   By: Eddie Candle M.D.   On: 02/20/2020 16:32    Procedures Procedures (including critical care time)  Medications Ordered in ED Medications  sodium chloride 0.9 % bolus 500 mL (0 mLs Intravenous Stopped 02/20/20 1645)  naloxone (NARCAN) injection 0.4 mg (0.4 mg Intravenous Given 02/20/20 1559)  lactated ringers bolus 1,000 mL (1,000 mLs Intravenous New Bag/Given 02/20/20 1824)    ED Course  I have reviewed the triage vital signs and the nursing notes.  Pertinent labs & imaging results that were available during my care of the patient were reviewed by me and considered in my medical decision making (see chart for details).    MDM Rules/Calculators/A&P                          Patient's altered mental state appears to be due to accidental narcotic overdose.  She had been given Narcan with significant improvement in her mental status and now she is alert and oriented and awake.  She  has maintained this for multiple hours since the Narcan.  She does have a mild bump in her creatinine and was given IV fluids.  She does indicate some diarrhea recently.  I counseled her that she is on multiple sedating meds and needs to follow-up with PCP.  She is able to ambulate to the bathroom  and would like to be discharged.  I think this is reasonable.  Otherwise chart review shows she got seen for dizziness yesterday but states this is not a current problem right now she feels fine.  DC with return precautions. Final Clinical Impression(s) / ED Diagnoses Final diagnoses:  Accidental opiate poisoning, initial encounter [facility])  Dehydration    Rx / DC Orders ED Discharge Orders    None       Sherwood Gambler, MD 02/20/20 1945

## 2020-02-20 NOTE — ED Triage Notes (Signed)
Pt coming from RDU for right foot pain from falling. Pt hard to arose.  Pt only able to arose with verbal stimuli.

## 2020-02-20 NOTE — Discharge Instructions (Signed)
You are on a lot of medicines that can make you too sleepy including your hydrocodone.  You need to be careful with these medicines as today they were too strong and causing you to be too sleepy.  He also have some dehydration and need to make sure you drink plenty of fluids.  Follow-up with your doctor early next week.

## 2020-02-20 NOTE — ED Notes (Signed)
Pt in hallway bed, not able to monitor.

## 2020-02-20 NOTE — ED Triage Notes (Signed)
Pt states she tripped and fell in the floor today, c/o pain to RT ankle pain and swelling.

## 2020-02-20 NOTE — Discharge Instructions (Addendum)
Patie

## 2020-02-20 NOTE — ED Notes (Signed)
Patient is being discharged from the Urgent Care and sent to the Emergency Department via ems . Per Molli Barrows, FNP patient is in need of higher level of care due to AMS. Patient is aware and verbalizes understanding of plan of care.  Vitals:   02/20/20 1302  BP: 123/60  Pulse: 62  Resp: 19  Temp: 97.9 F (36.6 C)  SpO2: 93%

## 2020-02-20 NOTE — ED Provider Notes (Addendum)
[facility]    CSN: [id number] Arrival date & time: 02/20/20  1136      History   Chief Complaint Chief Complaint  Patient presents with  . Fall    HPI [name] is a 69 y.o. female.   HPI  Patient with a history of OSA, COPD, Bipolar disease, CVA, presents today accompanied by caregiver who is concerned that patient may have suffered an injury related to a fall earlier today. She reports that patient has been increasingly drowsy today and suffered a fall when attempting to ambulate earlier this morning. When she arrived patient had fallen onto the floor after tripping trying to ambulate from a chair. Unknown of any head injury. Patient had been increasingly drowsy upon arrival here at urgent care. She is chronically prescribed benzodiazepines and controlled pain medication. Caregiver is uncertain if she is taking any of these medications during the middle the night. She did have a visit telemedicine yesterday with her primary care provider in which she complained of some dizziness. She also endorses some shortness of breath. Her oxygen level since being here today has ranged from 92 to 94% her baseline is 98-99 % in review of previous medical related visits. She is increasingly sleepy sitting here during the visit and often responds inappropriately to questions.  Past Medical History:  Diagnosis Date  . Allergy   . Anemia   . Anxiety    takes Ativan daily  . Arthritis   . Assistance needed for mobility   . Bipolar disorder ([place])    takes Risperdal nightly  . Blood transfusion   . Brain tumor ([place])   . Cancer ([place])    In her gum  . Carpal tunnel syndrome of right wrist 05/23/2011  . Cervical disc disorder with radiculopathy of cervical region 10/31/2012  . Chronic back pain   . Chronic idiopathic constipation   . Chronic neck and back pain   . Colon polyps   . COPD (chronic obstructive pulmonary disease) with chronic bronchitis ([place]) 09/16/2013   Office  Spirometry 10/30/2013-submaximal effort based on appearance of loop and curve. Numbers would fit with severe restriction but her physiologic capability may be better than this. FVC 0.91/44%, and 10.74/45%, FEV1/FVC 0.81, FEF 25-75% 1.43/69%    . Diabetes mellitus    Type II  . Diverticulosis    TCS 9/08 by Dr. Delfin Edis for diarrhea . Bx for micro scopic colitis negative.   . Fibromyalgia   . Frequent falls   . GERD (gastroesophageal reflux disease)    takes Aciphex daily  . Glaucoma    eye drops daily  . Gum symptoms    infection on antibiotic  . Hemiplegia affecting non-dominant side, post-stroke 08/02/2011  . Hiatal hernia   . Hyperlipidemia    takes Crestor daily  . Hypertension    takes Amlodipine,Metoprolol,and Clonidine daily  . Hypothyroidism    takes Synthroid daily  . IBS (irritable bowel syndrome)   . Insomnia    takes Trazodone nightly  . Major depression, recurrent ([place])    takes Zoloft daily  . 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 Metabolic encephalopathy 5/39/7673  . Migraines    chronic headaches  . Mononeuritis lower limb   . Narcolepsy   . Osteoporosis   . Pancreatitis 2006   due to Depakote with normal EUS   . Schatzki's ring    non critical / EGD with ED 8/2011with RMR  . Seizures ([place])    takes Lamictal daily.Last seizure  3 yrs ago  . Sleep apnea    on CPAP  . Small bowel obstruction ([place])   . Stroke [facility])    left sided weakness, speech changes  . Tubular adenoma of colon     Patient Active Problem List   Diagnosis Date Noted  . Acute nonintractable headache 02/19/2020  . Tubular adenoma of colon 02/09/2019  . Benign neoplasm of cerebral meninges ([place]) 11/12/2018  . Benign meningioma of brain ([place]) 10/31/2018  . Osteoarthritis of both hips 07/06/2018  . Lipoma of extremity 12/31/2017  . Impingement syndrome of left shoulder region 12/27/2017  . Leg weakness, bilateral 10/28/2017  . Lumbar spondylosis with myelopathy 10/12/2017  . Numbness  of hand 10/10/2017  . Chronic neck pain 07/25/2017  . Chronic pain syndrome 07/25/2017  . At risk for cardiovascular event 06/10/2017  . Diabetic polyneuropathy associated with type 2 diabetes mellitus ([place]) 05/19/2016  . Tobacco user 04/24/2016  . Obesity (BMI 30.0-34.9) 04/24/2016  . History of palpitations 08/09/2015  . Nausea 08/09/2015  . Labile hypertension 08/03/2015  . Normal coronary arteries 08/03/2015  . Dizziness 07/15/2015  . Left-sided low back pain with left-sided sciatica 06/27/2015  . Multinodular goiter 05/06/2015  . Rectocele, female 04/27/2015  . Anal sphincter incontinence 04/27/2015  . Pelvic relaxation due to rectocele 03/30/2015  . Pulmonary hypertension ([place]) 02/22/2015  . Light cigarette smoker (1-9 cigarettes per day) 01/11/2015  . Annual physical exam 01/05/2015  . Migraine without aura and without status migrainosus, not intractable 07/02/2014  . Flatulence 02/18/2014  . Microcytic anemia 02/18/2014  . COPD mixed type ([place]) 09/16/2013  . Hypothyroidism 08/16/2013  . Gastroparesis 04/28/2013  . Seizure disorder ([place]) 01/19/2013  . Cervical disc disorder with radiculopathy of cervical region 10/31/2012  . Solitary pulmonary nodule 08/19/2012  . Anemia 07/05/2012  . 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 Meningioma ([place]) 11/19/2011  . 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 Carpal tunnel syndrome of right wrist 05/23/2011  . Chronic pain of right hand 05/04/2011  . Polypharmacy 04/28/2011  . Bipolar disorder ([place]) 04/28/2011  . Constipation 04/13/2011  . Recurrent falls 12/12/2010  . Oropharyngeal dysphagia 07/12/2010  . Urinary incontinence 12/16/2009  . HEARING LOSS 10/26/2009  . Well controlled type 2 diabetes mellitus with gastroparesis ([place]) 07/07/2009  . Hyperlipidemia 12/11/2008  . IBS 12/11/2008  . GERD 07/29/2008  . MILK PRODUCTS ALLERGY 07/29/2008  . 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 Essential hypertension 06/27/2007  . Backache 06/19/2007  . Osteoporosis 06/19/2007  . Obstructive sleep apnea 06/19/2007  . TRIGGER FINGER 04/18/2007  . DIVERTICULOSIS, COLON 11/13/2006    Past Surgical History:  Procedure Laterality Date  . ABDOMINAL HYSTERECTOMY  1978  . BACK SURGERY  July 2012  . BACTERIAL OVERGROWTH TEST N/A 05/05/2013   Procedure: BACTERIAL OVERGROWTH TEST;  Surgeon: Daneil Dolin, MD;  [facility];  Service: Endoscopy;  Laterality: N/A;  7:30  . BIOPSY THYROID  2009  . BRAIN SURGERY  11/2011   resection of meningioma  . BREAST REDUCTION SURGERY  1994  . CARDIAC CATHETERIZATION  05/10/2005   normal coronaries, normal LV systolic function and EF (Dr. Jackie Plum)  . CARPAL TUNNEL RELEASE Left 07/22/04   Dr. Aline Brochure  . CATARACT EXTRACTION Bilateral   . CHOLECYSTECTOMY  1984  . COLONOSCOPY N/A 09/25/2012   JME:QASTMHD diverticulosis.  colonic polyp-removed : tubular adenoma  . CRANIOTOMY  11/23/2011   Procedure: CRANIOTOMY TUMOR EXCISION;  Surgeon:  Hosie Spangle, MD;  Location: [place] [facility];  Service: Neurosurgery;  Laterality: N/A;  Craniotomy for tumor resection  . ESOPHAGOGASTRODUODENOSCOPY  12/29/2010   [place]-Retained food in the esophagus and stomach, small hiatal hernia, status post Maloney dilation of the esophagus  . ESOPHAGOGASTRODUODENOSCOPY N/A 09/25/2012   SEG:BTDVVOHY atonic baggy esophagus status post Maloney dilation 43 F. Hiatal hernia  . GIVENS CAPSULE STUDY N/A 01/15/2013   NORMAL.   . IR GENERIC HISTORICAL  03/17/2016   IR RADIOLOGIST EVAL & MGMT 03/17/2016 [facility]  . LESION REMOVAL N/A 05/31/2015   Procedure: REMOVAL RIGHT AND LEFT LESIONS OF MANDIBLE;  Surgeon: Diona Browner, DDS;  Location: [facility];  Service: Oral Surgery;  Laterality: N/A;  . MALONEY DILATION  12/29/2010   RMR;  . NM MYOCAR PERF WALL MOTION  2006   "relavtiely normal" persantine, mild anterior thinning (breast attenuation artifact), no region of  scar/ischemia  . OVARIAN CYST REMOVAL    . RECTOCELE REPAIR N/A 06/29/2015   Procedure: POSTERIOR REPAIR (RECTOCELE);  Surgeon: Jonnie Kind, MD;  Location: [facility];  Service: Gynecology;  Laterality: N/A;  . REDUCTION MAMMAPLASTY Bilateral   . SPINE SURGERY  09/29/2010   Dr. Rolena Infante  . surgical excision of 3 tumors from right thigh and right buttock  and left upper thigh  2010  . TOOTH EXTRACTION Bilateral 12/14/2014   Procedure: REMOVAL OF BILATERAL MANDIBULAR EXOSTOSES;  Surgeon: Diona Browner, DDS;  Location: [facility];  Service: Oral Surgery;  Laterality: Bilateral;  . TRANSTHORACIC ECHOCARDIOGRAM  2010   EF 60-65%, mild conc LVH, grade 1 diastolic dysfunction; mildly calcified MV annulus with mildly thickened leaflets, mildly calcified MR annulus    OB History    Gravida  6   Para  1   Term  1   Preterm      AB  5   Living        SAB  5   IAB      Ectopic      Multiple      Live Births               Home Medications    Prior to Admission medications   Medication Sig Start Date End Date Taking? Authorizing Provider  albuterol (VENTOLIN HFA) 108 (90 Base) MCG/ACT inhaler INHALE 1 TO 2 PUFFS EVERY 6 HOURS AS NEEDED FOR WHEEZING, SHORTNESS OF BREATH 11/14/19   Young, Tarri Fuller D, MD  Alcohol Swabs (B-D SINGLE USE SWABS REGULAR) PADS USE TO CLEAN FINGER PRIOR TO STICKING FOR BLOOD SUGAR. 08/12/19   Philemon Kingdom, MD  alendronate (FOSAMAX) 70 MG tablet TAKE 1 TABLET (70 MG TOTAL) BY MOUTH EVERY 7 (SEVEN) DAYS. TAKE WITH A FULL GLASS OF WATER ON AN EMPTY STOMACH. 02/09/20   [name], MD  amLODipine (NORVASC) 10 MG tablet TAKE 1 TABLET EVERY DAY 01/20/20   Imogene Burn, PA-C  ascorbic acid (VITAMIN C) 500 MG tablet Take 500 mg by mouth daily.    [provider]  aspirin EC 81 MG tablet Take 1 tablet (81 mg total) by mouth daily with breakfast. 12/08/18   Emokpae, Courage, MD  betamethasone dipropionate 0.05 % cream Apply topically 2 (two) times daily  as needed (Rash). 02/17/20   Lavonna Monarch, MD  Blood Glucose Calibration (ACCU-CHEK GUIDE CONTROL) LIQD USE PRN TO CALIBRATE GLUCOMETER 08/12/19   Philemon Kingdom, MD  blood glucose meter kit and supplies Dispense based on patient and insurance preference. Use up to four  times daily as directed. (FOR ICD-10 E10.9, E11.9). 06/27/19   Philemon Kingdom, MD  buPROPion (WELLBUTRIN XL) 150 MG 24 hr tablet Take 1 tablet (150 mg total) by mouth every morning. 02/13/20 02/12/21  Cloria Spring, MD  cetirizine (ZYRTEC) 10 MG tablet Take 1 tablet (10 mg total) by mouth daily. 07/30/17   [name], MD  clobetasol cream (TEMOVATE) 6.37 % Apply 1 application topically 2 (two) times daily.  07/30/18   [provider]  Continuous Blood Gluc Sensor (FREESTYLE LIBRE 14 DAY SENSOR) MISC 1 each by Does not apply route every 14 (fourteen) days. Change every 2 weeks 06/24/19   Philemon Kingdom, MD  cromolyn (NASALCROM) 5.2 MG/ACT nasal spray Place 1 spray into both nostrils 4 (four) times daily. 01/17/19   Perlie Mayo, NP  diclofenac Sodium (VOLTAREN) 1 % GEL APPLY 2 GRAMS  TOPICALLY 4 (FOUR) TIMES DAILY. 06/19/19   Esterwood, Amy S, PA-C  dicyclomine (BENTYL) 10 MG capsule Take 1 capsule (10 mg total) by mouth 3 (three) times daily as needed for spasms. 07/02/19   Pyrtle, Lajuan Lines, MD  ezetimibe (ZETIA) 10 MG tablet TAKE 1 TABLET EVERY DAY 01/16/20   [name], MD  glucose blood (ACCU-CHEK GUIDE) test strip USE TO CHECK BLOOD SUGAR FOUR TIMES A DAY AND PRN 08/12/19   Philemon Kingdom, MD  Insulin Glargine, 2 Unit Dial, (TOUJEO MAX SOLOSTAR) 300 UNIT/ML SOPN Inject 25 Units into the skin at bedtime. 02/17/19   Philemon Kingdom, MD  lamoTRIgine (LAMICTAL) 100 MG tablet Take 1 tablet (100 mg total) by mouth 2 (two) times daily. 02/13/20   Cloria Spring, MD  Lancets (ACCU-CHEK SOFT [facility]) lancets Use as instructed to check blood sugar 3 times a day. 03/11/19   Philemon Kingdom, MD  levothyroxine  (SYNTHROID) 50 MCG tablet TAKE 1 TABLET DAILY, EXCEPT TAKE 1/2 TABLET ON SUNDAY 12/22/19   [name], MD  lidocaine (XYLOCAINE) 2 % solution RINSE WITH 15MLS AND SPIT OUT AFTER 30 SECONDS FOUR TIMES DAILY AS NEEDED FOR PAIN. 08/04/19   [provider]  LORazepam (ATIVAN) 0.5 MG tablet Take 1 tablet (0.5 mg total) by mouth 3 (three) times daily. 02/13/20 02/12/21  Cloria Spring, MD  losartan (COZAAR) 50 MG tablet TAKE 1 TABLET EVERY DAY 12/22/19   [name], MD  metoprolol tartrate (LOPRESSOR) 50 MG tablet TAKE 1 TABLET TWICE DAILY (NEED MD APPOINTMENT) 11/10/19   Ahmed Prima, Fransisco Hertz, PA-C  montelukast (SINGULAIR) 10 MG tablet TAKE 1 TABLET EVERY DAY 12/22/19   [name], MD  nicotine polacrilex (COMMIT) 4 MG lozenge Take 1 lozenge (4 mg total) by mouth as needed for smoking cessation. 02/10/20   [name], MD  NOVOLOG FLEXPEN 100 UNIT/ML FlexPen INJECT 10 TO 14 UNITS INTO THE SKIN 3 (THREE) TIMES DAILY WITH MEALS. 12/15/19   Philemon Kingdom, MD  nystatin (MYCOSTATIN/NYSTOP) powder APPLY TO AFFECTED AREA 4 TIMES DAILY. 02/09/20   [name], MD  Omega-3 Fatty Acids (FISH OIL PO) Take 1 capsule by mouth daily.    [provider]  ondansetron (ZOFRAN) 4 MG tablet Take 1 tablet (4 mg total) by mouth every 8 (eight) hours as needed for nausea or vomiting. 12/11/18   [name], MD  oxyCODONE-acetaminophen (PERCOCET) 10-325 MG tablet Take 1 tablet by mouth every 8 (eight) hours as needed.  08/06/18   [provider]  Plecanatide (TRULANCE) 3 MG TABS Take 3 mg by mouth daily. 01/27/20  Pyrtle, Lajuan Lines, MD  pregabalin (LYRICA) 75 MG capsule Take 1 capsule (75 mg total) by mouth daily. 10/20/16   Rexene Alberts, MD  RABEprazole (ACIPHEX) 20 MG tablet TAKE 1 TABLET TWICE DAILY (NEED MD APPOINTMENT) 12/01/19   Pyrtle, Lajuan Lines, MD  risperiDONE (RISPERDAL) 0.5 MG tablet Take 1 tablet (0.5 mg total) by mouth at bedtime. 02/13/20   Cloria Spring, MD  rosuvastatin (CRESTOR) 5 MG tablet TAKE 1 TABLET AT BEDTIME 11/18/19   [name], MD  sertraline (ZOLOFT) 100 MG tablet Take 1 tablet (100 mg total) by mouth daily. 02/13/20   Cloria Spring, MD  STIOLTO RESPIMAT 2.5-2.5 MCG/ACT AERS INHALE 2 PUFFS INTO THE LUNGS DAILY. 04/11/19   Deneise Lever, MD  traZODone (DESYREL) 100 MG tablet Take 1 tablet (100 mg total) by mouth at bedtime. 02/13/20   Cloria Spring, MD  UNABLE TO [facility] bed mattress x 1  DX: G47.33, J44.9 10/16/19   [name], MD    Family History Family History  Problem Relation Age of Onset  . Heart attack Mother        HTN  . Pneumonia Father   . Kidney failure Father   . Diabetes Father   . Pancreatic cancer Sister   . Diabetes Brother   . Hypertension Brother   . Diabetes Brother   . Cancer Sister        breast   . Hypertension Son   . Sleep apnea Son   . Cancer Sister        pancreatic  . Stroke Maternal Grandmother   . Heart attack Maternal Grandfather   . Alcohol abuse Maternal Uncle   . Colon cancer Neg Hx   . Anesthesia problems Neg Hx   . Hypotension Neg Hx   . Malignant hyperthermia Neg Hx   . Pseudochol deficiency Neg Hx   . Breast cancer Neg Hx     Social History Social History   Tobacco Use  . Smoking status: Current Every Day Smoker    Packs/day: 0.50    Years: 30.00    Pack years: 15.00    Types: Cigarettes  . Smokeless tobacco: Never Used  . Tobacco comment: 5-10 cigs a day  Vaping Use  . Vaping Use: Never used  Substance Use Topics  . Alcohol use: No    Alcohol/week: 0.0 standard drinks    Comment:    . Drug use: No     Allergies   Cephalexin, Iron, Milk-related compounds, Penicillins, and Phenazopyridine hcl   Review of Systems Review of Systems Pertinent negatives listed in HPI  Physical Exam Triage Vital Signs ED Triage Vitals  Enc Vitals Group     BP 02/20/20 1302 123/60     Pulse Rate 02/20/20 1302 62     Resp 02/20/20  1302 19     Temp 02/20/20 1302 97.9 F (36.6 C)     Temp Source 02/20/20 1302 Oral     SpO2 02/20/20 1302 93 %     Weight 02/20/20 1259 170 lb (77.1 kg)     Height --      Head Circumference --      Peak Flow --      Pain Score 02/20/20 1259 9     Pain Loc --      Pain Edu? --      Excl. in [place]? --    No data found.  Updated Vital Signs BP 123/60 (BP Location:  Right Arm)   Pulse 62   Temp 97.9 F (36.6 C) (Oral)   Resp 19   Wt 170 lb (77.1 kg)   SpO2 93%   BMI 34.34 kg/m   Visual Acuity Right Eye Distance:   Left Eye Distance:   Bilateral Distance:    Right Eye Near:   Left Eye Near:    Bilateral Near:     Physical Exam Constitutional:      General: She is sleeping. She is not in acute distress.    Appearance: She is obese.     Comments: Chronically ill-appearing  Cardiovascular:     Rate and Rhythm: Normal rate and regular rhythm.  Pulmonary:     Breath sounds: Decreased air movement present.  Neurological:     Mental Status: She is lethargic.  Psychiatric:        Mood and Affect: Affect is inappropriate.        Speech: Speech is delayed.     Comments: Patient at times a responding inappropriately to questions appears to be altered compared to her baseline according to accompanying caregiver. Patient is very drowsy and uneasy to  fully arouse      UC Treatments / Results  Labs (all labs ordered are listed, but only abnormal results are displayed) Labs Reviewed - No data to display  EKG NSR, 60 bpm, no ST changes  Radiology DG Ankle Complete Right  Result Date: 02/20/2020 CLINICAL DATA:  [place] last night.  Right ankle pain and swelling. EXAM: RIGHT ANKLE - COMPLETE 3+ VIEW COMPARISON:  None. FINDINGS: Oblique, nondisplaced, non comminuted fracture of the distal fibula extending from the posterolateral metadiaphysis to the medial metaphysis at the level of the ankle joint. No other fractures.  No bone lesions. Ankle joint normally spaced and aligned.  Small plantar and dorsal calcaneal spurs. Soft tissue swelling that predominates laterally. IMPRESSION: 1. Nondisplaced, non comminuted fracture of the distal fibula. Normally aligned ankle joint. Electronically Signed   By: Lajean Manes M.D.   On: 02/20/2020 13:22    Procedures Procedures (including critical care time)  Medications Ordered in UC Medications - No data to display  Initial Impression / Assessment and Plan / UC Course  I have reviewed the triage vital signs and the nursing notes.  Pertinent labs & imaging results that were available during my care of the patient were reviewed by me and considered in my medical decision making (see chart for details).   Patient sustained a nondisplaced nonunited fracture of the distal fibula confirmed by right ankle xray. Twelve-lead EKG normal sinus rhythm no ST changes. given patient's significant altered mental status, hypoxia evidenced by lower oxygen saturations compared to baseline and patient's increased lethargy calling EMS to transport patient to the ER for further evaluation. Patient has multiple comorbid conditions that predispose her to adverse consequences of hypoxia therefore she warrants further evaluation and higher level of care. EMS called patient discharged to their care she was stable.  Final Clinical Impressions(s) / UC Diagnoses   Final diagnoses:  Other closed fracture of distal end of right fibula, initial encounter  Hypoxia  Transient alteration of awareness     Discharge Instructions     Patient discharged to EMS to be evaluated any pain emergency department.   ED Prescriptions    None     PDMP not reviewed this encounter.   Scot Jun, FNP 02/20/20 1632    Scot Jun, FNP 02/20/20 [phone]

## 2020-02-20 NOTE — Telephone Encounter (Signed)
Spoke to pt she is using her old cpap and needs her new one I have sent a message to [facility] to find out the status of getting the new one [name]

## 2020-02-20 NOTE — ED Notes (Signed)
Pt decreased to 87% on RA.

## 2020-02-21 DIAGNOSIS — E1165 Type 2 diabetes mellitus with hyperglycemia: Secondary | ICD-10-CM | POA: Diagnosis not present

## 2020-02-21 DIAGNOSIS — E1142 Type 2 diabetes mellitus with diabetic polyneuropathy: Secondary | ICD-10-CM | POA: Diagnosis not present

## 2020-02-21 DIAGNOSIS — F17210 Nicotine dependence, cigarettes, uncomplicated: Secondary | ICD-10-CM | POA: Diagnosis not present

## 2020-02-21 DIAGNOSIS — M160 Bilateral primary osteoarthritis of hip: Secondary | ICD-10-CM | POA: Diagnosis not present

## 2020-02-21 DIAGNOSIS — R2681 Unsteadiness on feet: Secondary | ICD-10-CM | POA: Diagnosis not present

## 2020-02-21 DIAGNOSIS — J42 Unspecified chronic bronchitis: Secondary | ICD-10-CM | POA: Diagnosis not present

## 2020-02-21 DIAGNOSIS — F250 Schizoaffective disorder, bipolar type: Secondary | ICD-10-CM | POA: Diagnosis not present

## 2020-02-21 DIAGNOSIS — E039 Hypothyroidism, unspecified: Secondary | ICD-10-CM | POA: Diagnosis not present

## 2020-02-21 DIAGNOSIS — J449 Chronic obstructive pulmonary disease, unspecified: Secondary | ICD-10-CM | POA: Diagnosis not present

## 2020-02-23 ENCOUNTER — Telehealth: Payer: Self-pay | Admitting: Orthopedic Surgery

## 2020-02-23 DIAGNOSIS — E039 Hypothyroidism, unspecified: Secondary | ICD-10-CM | POA: Diagnosis not present

## 2020-02-23 DIAGNOSIS — J449 Chronic obstructive pulmonary disease, unspecified: Secondary | ICD-10-CM | POA: Diagnosis not present

## 2020-02-23 DIAGNOSIS — E1142 Type 2 diabetes mellitus with diabetic polyneuropathy: Secondary | ICD-10-CM | POA: Diagnosis not present

## 2020-02-23 DIAGNOSIS — E1165 Type 2 diabetes mellitus with hyperglycemia: Secondary | ICD-10-CM | POA: Diagnosis not present

## 2020-02-23 DIAGNOSIS — J42 Unspecified chronic bronchitis: Secondary | ICD-10-CM | POA: Diagnosis not present

## 2020-02-23 DIAGNOSIS — F17210 Nicotine dependence, cigarettes, uncomplicated: Secondary | ICD-10-CM | POA: Diagnosis not present

## 2020-02-23 DIAGNOSIS — M160 Bilateral primary osteoarthritis of hip: Secondary | ICD-10-CM | POA: Diagnosis not present

## 2020-02-23 DIAGNOSIS — F250 Schizoaffective disorder, bipolar type: Secondary | ICD-10-CM | POA: Diagnosis not present

## 2020-02-23 DIAGNOSIS — R2681 Unsteadiness on feet: Secondary | ICD-10-CM | POA: Diagnosis not present

## 2020-02-23 NOTE — Telephone Encounter (Signed)
I called and spoke to patient about scheduling an appointment.  She said she didn't want to come here.  She has her own ortho in [place].  She said she sees Dr. Veverly Fells and will call and make herself an appointment there.

## 2020-02-24 ENCOUNTER — Other Ambulatory Visit: Payer: Self-pay | Admitting: Family Medicine

## 2020-02-24 DIAGNOSIS — F17210 Nicotine dependence, cigarettes, uncomplicated: Secondary | ICD-10-CM | POA: Diagnosis not present

## 2020-02-24 DIAGNOSIS — F250 Schizoaffective disorder, bipolar type: Secondary | ICD-10-CM | POA: Diagnosis not present

## 2020-02-24 DIAGNOSIS — E1165 Type 2 diabetes mellitus with hyperglycemia: Secondary | ICD-10-CM | POA: Diagnosis not present

## 2020-02-24 DIAGNOSIS — R2681 Unsteadiness on feet: Secondary | ICD-10-CM | POA: Diagnosis not present

## 2020-02-24 DIAGNOSIS — E1142 Type 2 diabetes mellitus with diabetic polyneuropathy: Secondary | ICD-10-CM | POA: Diagnosis not present

## 2020-02-24 DIAGNOSIS — M160 Bilateral primary osteoarthritis of hip: Secondary | ICD-10-CM | POA: Diagnosis not present

## 2020-02-24 DIAGNOSIS — E039 Hypothyroidism, unspecified: Secondary | ICD-10-CM | POA: Diagnosis not present

## 2020-02-24 DIAGNOSIS — J449 Chronic obstructive pulmonary disease, unspecified: Secondary | ICD-10-CM | POA: Diagnosis not present

## 2020-02-24 DIAGNOSIS — J42 Unspecified chronic bronchitis: Secondary | ICD-10-CM | POA: Diagnosis not present

## 2020-02-26 ENCOUNTER — Other Ambulatory Visit: Payer: Self-pay

## 2020-02-26 ENCOUNTER — Ambulatory Visit (INDEPENDENT_AMBULATORY_CARE_PROVIDER_SITE_OTHER): Payer: Medicare HMO | Admitting: Psychiatry

## 2020-02-26 DIAGNOSIS — F331 Major depressive disorder, recurrent, moderate: Secondary | ICD-10-CM | POA: Diagnosis not present

## 2020-02-26 NOTE — Progress Notes (Signed)
Virtual Visit via Telephone Note  I connected with [name] on 02/26/20 at  4:00 PM EST by telephone and verified that I am speaking with the correct person using two identifiers.  Location: Patient: Home Provider: [place] office    I discussed the limitations, risks, security and privacy concerns of performing an evaluation and management service by telephone and the availability of in person appointments. I also discussed with the patient that there may be a patient responsible charge related to this service. The patient expressed understanding and agreed to proceed.  I provided 50 minutes of non-face-to-face time during this encounter.   [name], LCSW                   THERAPIST PROGRESS NOTE  Session Time: Thursday 02/26/2020 4:00 PM - 4:50 PM          Participation Level: Active  Behavioral Response:  depressed, anxious  Type of Therapy: Individual Therapy  Treatment Goals addressed: Patient wants to learn how to improve coping skills to manage chronic pain and health issues/ improve mood    Interventions: CBT and Supportive   Summary: [name] is a 69 y.o. female who presents with  long standing history of recurrent periods  of depression beginning when she was [age] and her favorite uncle died. Patient reports multiple psychiatric hospitlaizations due to depression and suicidal ideations with the last one occuring in 1997. Patient has participated in outpatient psychotherapy and medication management intermittently since age 57. She currently is seeing psychiatrist Dr. Harrington Challenger . Prior to this, she was seen at [facility]. Patient also has had ECT at [facility]. Symptoms have worsened in recent months due to family stress and have  included depressed mood, anxiety, excessive worry, and tearfulness.         Patient's last contact was by virtual visit via telephone about 4  weeks ago. She reports things were going well until she fell this past Friday.  She reports hurting her ankle and eventually ended up at the ED. She expresses frustration as medical records indicate she was diagnosed with accidental overdose of opiates. However, patient reports she did not take too much medication and is worried how medical records may affect her insurance. She is trying to use assertiveness skills to express her concerns and obtain medical information. She also continues to fear that she may have to go to a nursing home. She reports doing all that she can to take care of self to avoid any more falls. She reports managing Thanksgiving well. She expresses some disappointment her son and granddaughter were not there but reports using helpful coping strategies to manage. She continues to have support from her aide.     Suicidal/Homicidal: Nowithout intent/plan  Therapist Response: Reviewed symptoms, discussed stressors, facilitated expression of thoughts and feelings, validated feelings, assisted patient identify anxiety provoking thoughts and  replace with helpful thought statements,  praised and reinforced patient's of assertiveness skills, discussed using gratitude list to promote more positive thinking.  Plan: Return again in 2 weeks.  Diagnosis: Axis I: MDD, Recurrent, moderate    Axis II: Deferred    [name] Hockley E Radames Mejorado, LCSW

## 2020-02-27 ENCOUNTER — Other Ambulatory Visit (HOSPITAL_COMMUNITY): Payer: Self-pay | Admitting: Student

## 2020-02-27 ENCOUNTER — Other Ambulatory Visit: Payer: Self-pay

## 2020-02-27 ENCOUNTER — Ambulatory Visit (HOSPITAL_COMMUNITY)
Admission: RE | Admit: 2020-02-27 | Discharge: 2020-02-27 | Disposition: A | Discharge status: Home / Self Care | Payer: Medicare HMO | Source: Ambulatory Visit | Attending: Cardiovascular Disease | Admitting: Cardiovascular Disease

## 2020-02-27 ENCOUNTER — Other Ambulatory Visit (HOSPITAL_COMMUNITY): Payer: Self-pay | Admitting: Orthopedic Surgery

## 2020-02-27 DIAGNOSIS — M79604 Pain in right leg: Secondary | ICD-10-CM

## 2020-02-27 DIAGNOSIS — M25561 Pain in right knee: Secondary | ICD-10-CM | POA: Diagnosis not present

## 2020-02-27 DIAGNOSIS — F17200 Nicotine dependence, unspecified, uncomplicated: Secondary | ICD-10-CM | POA: Diagnosis not present

## 2020-02-27 DIAGNOSIS — S8264XA Nondisplaced fracture of lateral malleolus of right fibula, initial encounter for closed fracture: Secondary | ICD-10-CM | POA: Diagnosis not present

## 2020-02-27 DIAGNOSIS — M25571 Pain in right ankle and joints of right foot: Secondary | ICD-10-CM | POA: Diagnosis not present

## 2020-02-27 DIAGNOSIS — S8261XA Displaced fracture of lateral malleolus of right fibula, initial encounter for closed fracture: Secondary | ICD-10-CM | POA: Insufficient documentation

## 2020-02-29 ENCOUNTER — Other Ambulatory Visit: Payer: Self-pay | Admitting: Internal Medicine

## 2020-03-01 ENCOUNTER — Telehealth: Payer: Self-pay | Admitting: Radiation Therapy

## 2020-03-01 ENCOUNTER — Other Ambulatory Visit: Payer: Self-pay | Admitting: Radiation Therapy

## 2020-03-01 ENCOUNTER — Encounter: Payer: Self-pay | Admitting: Dermatology

## 2020-03-01 DIAGNOSIS — F17210 Nicotine dependence, cigarettes, uncomplicated: Secondary | ICD-10-CM | POA: Diagnosis not present

## 2020-03-01 DIAGNOSIS — F250 Schizoaffective disorder, bipolar type: Secondary | ICD-10-CM | POA: Diagnosis not present

## 2020-03-01 DIAGNOSIS — J449 Chronic obstructive pulmonary disease, unspecified: Secondary | ICD-10-CM | POA: Diagnosis not present

## 2020-03-01 DIAGNOSIS — E039 Hypothyroidism, unspecified: Secondary | ICD-10-CM | POA: Diagnosis not present

## 2020-03-01 DIAGNOSIS — M160 Bilateral primary osteoarthritis of hip: Secondary | ICD-10-CM | POA: Diagnosis not present

## 2020-03-01 DIAGNOSIS — J42 Unspecified chronic bronchitis: Secondary | ICD-10-CM | POA: Diagnosis not present

## 2020-03-01 DIAGNOSIS — D329 Benign neoplasm of meninges, unspecified: Secondary | ICD-10-CM

## 2020-03-01 DIAGNOSIS — E1165 Type 2 diabetes mellitus with hyperglycemia: Secondary | ICD-10-CM | POA: Diagnosis not present

## 2020-03-01 DIAGNOSIS — R2681 Unsteadiness on feet: Secondary | ICD-10-CM | POA: Diagnosis not present

## 2020-03-01 DIAGNOSIS — E1142 Type 2 diabetes mellitus with diabetic polyneuropathy: Secondary | ICD-10-CM | POA: Diagnosis not present

## 2020-03-01 NOTE — Progress Notes (Signed)
   Follow-Up Visit   Subjective  [name] is a 69 y.o. female who presents for the following: Annual Exam (right elbow ith & dark, also scalp dry).  Rash right elbow, dryness scalp Location:  Duration:  Quality:  Associated Signs/Symptoms: Modifying Factors:  Severity:  Timing: Context:   Objective  Well appearing patient in no apparent distress; mood and affect are within normal limits. Objective  Right Elbow - Posterior: More hyperkeratosis than psoriasiform inflammation with postinflammatory hyperpigmentation.  No changes in other areas including scalp.  No nail changes  Objective  Mid Occipital Scalp: Although patient complains of significant scalp dryness, there is no current evidence of scale or inflammation on her scalp.  No changes on ears.  She will return if this flares.    A focused examination was performed including Head, neck, elbows, nails, ears.. Relevant physical exam findings are noted in the Assessment and Plan.   Assessment & Plan    Hyperkeratosis Right Elbow - Posterior  We will try topical generic Diprolene daily after bathing for 4 to 6weeks.  Patient told this is very unlikely to improve the pigmentation.  Ordered Medications: betamethasone dipropionate 0.05 % cream  Seborrheic dermatitis Mid Occipital Scalp  May use any zinc pyrithione shampoo on her scalp every 1 to 2 weeks.  Recheck if symptoms worsen.      I, Lavonna Monarch, MD, have reviewed all documentation for this visit.  The documentation on 03/01/20 for the exam, diagnosis, procedures, and orders are all accurate and complete.

## 2020-03-01 NOTE — Progress Notes (Signed)
I have contacted [facility] and spoke to Weston. He indicates that as of 03/01/20, patient still has not returned her SIBO testing.

## 2020-03-01 NOTE — Telephone Encounter (Signed)
Spoke with [name] about her upcoming MRI and visit with Dr. Mickeal Skinner in Feb. She has everything written down along with my contact information if she has questions.   Mont Dutton R.T. (R).(T). Special Procedures Navigator

## 2020-03-03 ENCOUNTER — Telehealth: Payer: Self-pay | Admitting: Internal Medicine

## 2020-03-03 DIAGNOSIS — J42 Unspecified chronic bronchitis: Secondary | ICD-10-CM | POA: Diagnosis not present

## 2020-03-03 DIAGNOSIS — R2681 Unsteadiness on feet: Secondary | ICD-10-CM | POA: Diagnosis not present

## 2020-03-03 DIAGNOSIS — M160 Bilateral primary osteoarthritis of hip: Secondary | ICD-10-CM | POA: Diagnosis not present

## 2020-03-03 DIAGNOSIS — F17210 Nicotine dependence, cigarettes, uncomplicated: Secondary | ICD-10-CM | POA: Diagnosis not present

## 2020-03-03 DIAGNOSIS — F250 Schizoaffective disorder, bipolar type: Secondary | ICD-10-CM | POA: Diagnosis not present

## 2020-03-03 DIAGNOSIS — J449 Chronic obstructive pulmonary disease, unspecified: Secondary | ICD-10-CM | POA: Diagnosis not present

## 2020-03-03 DIAGNOSIS — E039 Hypothyroidism, unspecified: Secondary | ICD-10-CM | POA: Diagnosis not present

## 2020-03-03 DIAGNOSIS — E1142 Type 2 diabetes mellitus with diabetic polyneuropathy: Secondary | ICD-10-CM | POA: Diagnosis not present

## 2020-03-03 DIAGNOSIS — E1165 Type 2 diabetes mellitus with hyperglycemia: Secondary | ICD-10-CM | POA: Diagnosis not present

## 2020-03-03 MED ORDER — AZITHROMYCIN 250 MG PO TABS: 250.0000 mg | ORAL_TABLET | ORAL | 0 refills | Status: DC

## 2020-03-03 NOTE — Telephone Encounter (Signed)
Spoke with the pt and notified of response per Dr Annamaria Boots.  Rx for zpack called to pharm  Advised call back if not improving

## 2020-03-03 NOTE — Telephone Encounter (Signed)
Spoke with the pt  She is c/o cough with moderate amounts of beige colored sputum x 1 wk  She has been taking mucinex  She denies any increased SOB, wheezing, chest tightness, f/c/s  She has had covid vax x 3  Please advise thank you!  Current Outpatient Medications on File Prior to Visit  Medication Sig Dispense Refill   albuterol (VENTOLIN HFA) 108 (90 Base) MCG/ACT inhaler INHALE 1 TO 2 PUFFS EVERY 6 HOURS AS NEEDED FOR WHEEZING, SHORTNESS OF BREATH 1 each 3   Alcohol Swabs (B-D SINGLE USE SWABS REGULAR) PADS USE TO CLEAN FINGER PRIOR TO STICKING FOR BLOOD SUGAR. 100 each 11   alendronate (FOSAMAX) 70 MG tablet TAKE 1 TABLET (70 MG TOTAL) BY MOUTH EVERY 7 (SEVEN) DAYS. TAKE WITH A FULL GLASS OF WATER ON AN EMPTY STOMACH. 12 tablet 3   amLODipine (NORVASC) 10 MG tablet TAKE 1 TABLET EVERY DAY 90 tablet 3   ascorbic acid (VITAMIN C) 500 MG tablet Take 500 mg by mouth daily.     aspirin EC 81 MG tablet Take 1 tablet (81 mg total) by mouth daily with breakfast. 120 tablet 2   betamethasone dipropionate 0.05 % cream Apply topically 2 (two) times daily as needed (Rash). 45 g 3   Blood Glucose Calibration (ACCU-CHEK GUIDE CONTROL) LIQD USE PRN TO CALIBRATE GLUCOMETER 3 each 11   blood glucose meter kit and supplies Dispense based on patient and insurance preference. Use up to four times daily as directed. (FOR ICD-10 E10.9, E11.9). 1 each 0   buPROPion (WELLBUTRIN XL) 150 MG 24 hr tablet Take 1 tablet (150 mg total) by mouth every morning. 90 tablet 2   cetirizine (ZYRTEC) 10 MG tablet Take 1 tablet (10 mg total) by mouth daily. 7 tablet 0   clobetasol cream (TEMOVATE) 0.62 % Apply 1 application topically 2 (two) times daily.      Continuous Blood Gluc Sensor (FREESTYLE LIBRE 14 DAY SENSOR) MISC 1 each by Does not apply route every 14 (fourteen) days. Change every 2 weeks 2 each 11   cromolyn (NASALCROM) 5.2 MG/ACT nasal spray Place 1 spray into both nostrils 4 (four) times daily. 13  mL 1   diclofenac Sodium (VOLTAREN) 1 % GEL APPLY 2 GRAMS  TOPICALLY 4 (FOUR) TIMES DAILY. 800 g 0   dicyclomine (BENTYL) 10 MG capsule Take 1 capsule (10 mg total) by mouth 3 (three) times daily as needed for spasms. 90 capsule 2   ezetimibe (ZETIA) 10 MG tablet TAKE 1 TABLET EVERY DAY 90 tablet 1   glucose blood (ACCU-CHEK GUIDE) test strip USE TO CHECK BLOOD SUGAR FOUR TIMES A DAY AND PRN 400 each 11   lamoTRIgine (LAMICTAL) 100 MG tablet Take 1 tablet (100 mg total) by mouth 2 (two) times daily. 180 tablet 2   Lancets (ACCU-CHEK SOFT TOUCH) lancets Use as instructed to check blood sugar 3 times a day. 100 each 12   levothyroxine (SYNTHROID) 50 MCG tablet TAKE 1 TABLET DAILY, EXCEPT TAKE 1/2 TABLET ON SUNDAY 85 tablet 1   lidocaine (XYLOCAINE) 2 % solution RINSE WITH 15MLS AND SPIT OUT AFTER 30 SECONDS FOUR TIMES DAILY AS NEEDED FOR PAIN.     LORazepam (ATIVAN) 0.5 MG tablet Take 1 tablet (0.5 mg total) by mouth 3 (three) times daily. 270 tablet 2   losartan (COZAAR) 50 MG tablet TAKE 1 TABLET EVERY DAY 90 tablet 1   metoprolol tartrate (LOPRESSOR) 50 MG tablet TAKE 1 TABLET TWICE DAILY (NEED MD  APPOINTMENT) 180 tablet 3   montelukast (SINGULAIR) 10 MG tablet TAKE 1 TABLET EVERY DAY 90 tablet 1   nicotine polacrilex (COMMIT) 4 MG lozenge Take 1 lozenge (4 mg total) by mouth as needed for smoking cessation. 100 tablet 0   NOVOLOG FLEXPEN 100 UNIT/ML FlexPen INJECT 10 TO 14 UNITS INTO THE SKIN 3 (THREE) TIMES DAILY WITH MEALS. 45 mL 3   nystatin (MYCOSTATIN/NYSTOP) powder APPLY TO AFFECTED AREA 4 TIMES DAILY. 90 g 1   Omega-3 Fatty Acids (FISH OIL PO) Take 1 capsule by mouth daily.     ondansetron (ZOFRAN) 4 MG tablet Take 1 tablet (4 mg total) by mouth every 8 (eight) hours as needed for nausea or vomiting. 20 tablet 0   oxyCODONE-acetaminophen (PERCOCET) 10-325 MG tablet Take 1 tablet by mouth every 8 (eight) hours as needed.      Plecanatide (TRULANCE) 3 MG TABS Take 3 mg by  mouth daily. 30 tablet 3   pregabalin (LYRICA) 75 MG capsule Take 1 capsule (75 mg total) by mouth daily.     RABEprazole (ACIPHEX) 20 MG tablet TAKE 1 TABLET TWICE DAILY (NEED MD APPOINTMENT) 180 tablet 0   risperiDONE (RISPERDAL) 0.5 MG tablet Take 1 tablet (0.5 mg total) by mouth at bedtime. 90 tablet 2   rosuvastatin (CRESTOR) 5 MG tablet TAKE 1 TABLET AT BEDTIME 90 tablet 2   sertraline (ZOLOFT) 100 MG tablet Take 1 tablet (100 mg total) by mouth daily. 90 tablet 2   STIOLTO RESPIMAT 2.5-2.5 MCG/ACT AERS INHALE 2 PUFFS INTO THE LUNGS DAILY. 12 g 3   SURE COMFORT PEN NEEDLES 31G X 8 MM MISC USE TO TEST 4 TIMES DAILY. 100 each PRN   TOUJEO MAX SOLOSTAR 300 UNIT/ML Solostar Pen INJECT 24 UNITS SUBCUTANEOUSLY INTO THE SKIN AT BEDTIME 12 mL 3   traZODone (DESYREL) 100 MG tablet Take 1 tablet (100 mg total) by mouth at bedtime. 90 tablet 2   UNABLE TO [facility] bed mattress x 1  DX: G47.33, J44.9 1 each 0   Current Facility-Administered Medications on File Prior to Visit  Medication Dose Route Frequency Provider Last Rate Last Admin   meclizine (ANTIVERT) tablet 25 mg  25 mg Oral TID PRN Noreene Larsson, NP       Allergies  Allergen Reactions   Cephalexin Hives   Iron Nausea And Vomiting   Milk-Related Compounds Other (See Comments)    Doesn't agree with stomach.    Penicillins Hives    Has patient had a PCN reaction causing immediate rash, facial/tongue/throat swelling, SOB or lightheadedness with hypotension: Yes Has patient had a PCN reaction causing severe rash involving mucus membranes or skin necrosis: No Has patient had a PCN reaction that required hospitalization No Has patient had a PCN reaction occurring within the last 10 years: No If all of the above answers are "NO", then may proceed with Cephalosporin use.    Phenazopyridine Hcl Hives

## 2020-03-03 NOTE — Telephone Encounter (Signed)
Please offer Zpak 250 mg, # 6, 2 today then one daily 

## 2020-03-04 ENCOUNTER — Other Ambulatory Visit: Payer: Self-pay | Admitting: Radiation Therapy

## 2020-03-05 ENCOUNTER — Other Ambulatory Visit: Payer: Self-pay | Admitting: Internal Medicine

## 2020-03-08 ENCOUNTER — Telehealth: Payer: Self-pay | Admitting: Internal Medicine

## 2020-03-08 ENCOUNTER — Other Ambulatory Visit: Payer: Self-pay | Admitting: *Deleted

## 2020-03-08 DIAGNOSIS — R2681 Unsteadiness on feet: Secondary | ICD-10-CM | POA: Diagnosis not present

## 2020-03-08 DIAGNOSIS — E1142 Type 2 diabetes mellitus with diabetic polyneuropathy: Secondary | ICD-10-CM | POA: Diagnosis not present

## 2020-03-08 DIAGNOSIS — E039 Hypothyroidism, unspecified: Secondary | ICD-10-CM | POA: Diagnosis not present

## 2020-03-08 DIAGNOSIS — M160 Bilateral primary osteoarthritis of hip: Secondary | ICD-10-CM | POA: Diagnosis not present

## 2020-03-08 DIAGNOSIS — J42 Unspecified chronic bronchitis: Secondary | ICD-10-CM | POA: Diagnosis not present

## 2020-03-08 DIAGNOSIS — F17210 Nicotine dependence, cigarettes, uncomplicated: Secondary | ICD-10-CM | POA: Diagnosis not present

## 2020-03-08 DIAGNOSIS — E1165 Type 2 diabetes mellitus with hyperglycemia: Secondary | ICD-10-CM | POA: Diagnosis not present

## 2020-03-08 DIAGNOSIS — J449 Chronic obstructive pulmonary disease, unspecified: Secondary | ICD-10-CM | POA: Diagnosis not present

## 2020-03-08 DIAGNOSIS — F250 Schizoaffective disorder, bipolar type: Secondary | ICD-10-CM | POA: Diagnosis not present

## 2020-03-08 MED ORDER — NOVOLOG FLEXPEN 100 UNIT/ML ~~LOC~~ SOPN: PEN_INJECTOR | SUBCUTANEOUS | 3 refills | Status: DC

## 2020-03-08 MED ORDER — SURE COMFORT PEN NEEDLES 31G X 8 MM MISC: 3 refills | Status: DC

## 2020-03-08 NOTE — Telephone Encounter (Signed)
REFILL REQUEST FOR:  Novolog and Pen needles  PHARMACY:  [facility] [place] [facility] [address] Phone:  [phone]  Fax:  [phone]     ---- Patient asked if Dr or nurse could give her a call as soon as someone had a chance. Did not specify why. Ph# [phone]

## 2020-03-09 ENCOUNTER — Telehealth (INDEPENDENT_AMBULATORY_CARE_PROVIDER_SITE_OTHER): Payer: Medicare HMO | Admitting: Family Medicine

## 2020-03-09 ENCOUNTER — Encounter: Payer: Self-pay | Admitting: Family Medicine

## 2020-03-09 ENCOUNTER — Other Ambulatory Visit: Payer: Self-pay

## 2020-03-09 DIAGNOSIS — R296 Repeated falls: Secondary | ICD-10-CM | POA: Diagnosis not present

## 2020-03-09 DIAGNOSIS — Z79899 Other long term (current) drug therapy: Secondary | ICD-10-CM

## 2020-03-09 DIAGNOSIS — R0989 Other specified symptoms and signs involving the circulatory and respiratory systems: Secondary | ICD-10-CM | POA: Insufficient documentation

## 2020-03-09 DIAGNOSIS — J42 Unspecified chronic bronchitis: Secondary | ICD-10-CM | POA: Diagnosis not present

## 2020-03-09 DIAGNOSIS — M4716 Other spondylosis with myelopathy, lumbar region: Secondary | ICD-10-CM | POA: Diagnosis not present

## 2020-03-09 DIAGNOSIS — M160 Bilateral primary osteoarthritis of hip: Secondary | ICD-10-CM | POA: Diagnosis not present

## 2020-03-09 DIAGNOSIS — F250 Schizoaffective disorder, bipolar type: Secondary | ICD-10-CM | POA: Diagnosis not present

## 2020-03-09 DIAGNOSIS — J449 Chronic obstructive pulmonary disease, unspecified: Secondary | ICD-10-CM | POA: Diagnosis not present

## 2020-03-09 DIAGNOSIS — F17210 Nicotine dependence, cigarettes, uncomplicated: Secondary | ICD-10-CM | POA: Diagnosis not present

## 2020-03-09 DIAGNOSIS — E1165 Type 2 diabetes mellitus with hyperglycemia: Secondary | ICD-10-CM | POA: Diagnosis not present

## 2020-03-09 DIAGNOSIS — R2681 Unsteadiness on feet: Secondary | ICD-10-CM | POA: Diagnosis not present

## 2020-03-09 DIAGNOSIS — E039 Hypothyroidism, unspecified: Secondary | ICD-10-CM | POA: Diagnosis not present

## 2020-03-09 DIAGNOSIS — E1142 Type 2 diabetes mellitus with diabetic polyneuropathy: Secondary | ICD-10-CM | POA: Diagnosis not present

## 2020-03-09 MED ORDER — BENZONATATE 100 MG PO CAPS: 100.0000 mg | ORAL_CAPSULE | Freq: Two times a day (BID) | ORAL | 0 refills | Status: DC | PRN

## 2020-03-09 NOTE — Assessment & Plan Note (Addendum)
reports  1 fall and one near fall this morning, denies bleeding or bruising Posiitve h/o recurrent falls, polypharmacy which increases  fall risk, best placement would be in a  Home or skilled facility, I again encouraged this and she is not interested Requests a wheelchair for use both inside and outside of her home, which is appropriate based on her recurrent falls

## 2020-03-09 NOTE — Progress Notes (Addendum)
Virtual Visit via Telephone Note  I connected with Shaasia Odle Uriostegui on 03/09/20 at  8:20 AM EST by telephone and verified that I am speaking with the correct person using two identifiers.  Location: Patient: home Provider: office   I discussed the limitations, risks, security and privacy concerns of performing an evaluation and management service by telephone and the availability of in person appointments. I also discussed with the patient that there may be a patient responsible charge related to this service. The patient expressed understanding and agreed to proceed. HPI Pt reports pT was in her home yesterday and tried to work with right ankle pain and weakness, states she fell tooday when getting out of lift chair, right leg went numb, slid to the chair, later this morning when trying to answer phone she slid off the bed, fell on right leg, no cuts or bleeding, repeatedly states she has had no aide in her home since last Wednesday and is unsure if one will come to Cough productive of white phlegm x 1 week, no fever or chills, no sinus pressure, sore throat or drainage History of Present Illness: See above  Observations/Objective: There were no vitals taken for this visit. Good communication with no confusion and intact memory. Alert and oriented x 3 No signs of respiratory distress during speech    Assessment and Plan: Chest congestion No symptoms of o infection, tessalon perle prescribed  Recurrent falls reports  1 fall and one near fall this morning, denies bleeding or bruising Posiitve h/o recurrent falls, polypharmacy which increases  fall risk, best placement would be in a  Home or skilled facility, I again encouraged this and she is not interested Requests a wheelchair for use both inside and outside of her home, which is appropriate based on her recurrent falls  Polypharmacy Multiple medications which increase unsteady gait and fall risk, wheelchair for inside and outside of the  home needed  Lumbar spondylosis with myelopathy Lower extremity weakness, placing patient at increased fall risk, needs wheelchair for use both inside and outside of her home for improved quality of life and length of life    Follow Up Instructions:    I discussed the assessment and treatment plan with the patient. The patient was provided an opportunity to ask questions and all were answered. The patient agreed with the plan and demonstrated an understanding of the instructions.   The patient was advised to call back or seek an in-person evaluation if the symptoms worsen or if the condition fails to improve as anticipated.  I provided 15 minutes of non-face-to-face time during this encounter.   Syliva Overman, MD

## 2020-03-09 NOTE — Assessment & Plan Note (Signed)
No symptoms of o infection, tessalon perle prescribed

## 2020-03-09 NOTE — Patient Instructions (Addendum)
F/U as before, call if you need me sooner  Tessalon perles are prescribed to help with chest congestion  You may use sugar free robitussin DM for cough suppression  Need to be careful, to prevent additional falls  Need to contact Orthopedic dr re ankle pain, and also your Pain Doctor  Best for 2022!    Understanding Your Risk for Falls Each year, millions of people have serious injuries from falls. It is important to understand your risk for falling. Talk with your health care provider about your risk and what you can do to lower it. There are actions you can take at home to lower your risk. If you do have a serious fall, make sure you tell your health care provider. Falling once raises your risk for falling again. How can falls affect me? Serious injuries from falls are common. These include:  Broken bones, such as hip fractures.  Head injuries, such as traumatic brain injuries (TBI). Fear of falling can also cause you to avoid activities and stay at home. This can make your muscles weaker and actually raise your risk for a fall. What can increase my risk? There are a number of risk factors that increase your risk for falling. The more risk factors you have, the higher your risk for falling. Serious injuries from a fall most often happen to people older than age 69. Children and young adults ages 40-29 are also at higher risk. Common risk factors include:  Weakness in the lower body.  Lack (deficiency) of vitamin D.  Being generally weak or confused due to long-term (chronic) illness.  Dizziness or balance problems.  Poor vision.  Medicines that cause dizziness or drowsiness. These can include medicines for your blood pressure, heart, anxiety, insomnia, or edema, as well as pain medicines and muscle relaxants. Other risk factors include:  Drinking alcohol.  Having had a fall in the past.  Having depression.  Foot pain or improper footwear.  Working at a dangerous  job.  Having any of the following in your home: ? Tripping hazards, such as floor clutter or loose rugs. ? Poor lighting. ? Pets or clutter.  Dementia or memory loss. What actions can I take to lower my risk of falling?     Physical activity Maintain physical fitness. Do strength and balance exercises. Consider taking a regular class to build strength and balance. Yoga and tai chi are good options. Vision Have your eyes checked every year and your vision prescription updated as needed. Walking aids and footwear  Wear nonskid shoes. Do not wear high heels.  Do not walk around the house in socks or slippers.  Use a cane or walker as told by your health care provider. Home safety  Attach secure railings on both sides of your stairs.  Install grab bars for your tub, shower, and toilet. Use a bath mat in your tub or shower.  Use good lighting in all rooms. Keep a flashlight near your bed.  Make sure there is a clear path from your bed to the bathroom. Use night-lights.  Do not use throw rugs. Make sure all carpeting is taped or tacked down securely.  Remove all clutter from walkways and stairways, including extension cords.  Repair uneven or broken steps.  Avoid walking on icy or slippery surfaces. Walk on the grass instead of on icy or slick sidewalks. Where you can, use ice melt to get rid of ice on walkways.  Use a cordless phone. Questions to ask your  health care provider  Can you help me check my risk for a fall?  Do any of my medicines make me more likely to fall?  Should I take a vitamin D supplement?  What exercises can I do to improve my strength and balance?  Should I make an appointment to have my vision checked?  Do I need a bone density test to check for weak bones or osteoporosis?  Would it help to use a cane or a walker? Where to find more information  Centers for Disease Control and Prevention, STEADI: http://www.wolf.info/  Community-Based Fall  Prevention Programs: http://www.wolf.info/  National Institute on Aging: ToneConnect.com.ee Contact a health care provider if:  You fall at home.  You are afraid of falling at home.  You feel weak, drowsy, or dizzy. Summary  People 69 and older are at high risk for falling. However, older people are not the only ones injured in falls. Children and young adults have a higher-than-normal risk too.  Talk with your health care provider about your risks for falling and how to lower those risks.  Taking certain precautions at home can lower your risk for falling.  If you fall, always tell your health care provider. This information is not intended to replace advice given to you by your health care provider. Make sure you discuss any questions you have with your health care provider. Document Revised: 09/04/2018 Document Reviewed: 09/04/2018 Elsevier Patient Education  [place].

## 2020-03-10 ENCOUNTER — Other Ambulatory Visit: Payer: Self-pay | Admitting: Internal Medicine

## 2020-03-11 ENCOUNTER — Ambulatory Visit: Payer: Medicare HMO | Admitting: Internal Medicine

## 2020-03-11 ENCOUNTER — Ambulatory Visit (HOSPITAL_COMMUNITY): Payer: Medicare HMO | Admitting: Psychiatry

## 2020-03-13 DIAGNOSIS — J449 Chronic obstructive pulmonary disease, unspecified: Secondary | ICD-10-CM | POA: Diagnosis not present

## 2020-03-13 DIAGNOSIS — I10 Essential (primary) hypertension: Secondary | ICD-10-CM | POA: Diagnosis not present

## 2020-03-15 ENCOUNTER — Telehealth: Payer: Self-pay

## 2020-03-15 DIAGNOSIS — J42 Unspecified chronic bronchitis: Secondary | ICD-10-CM | POA: Diagnosis not present

## 2020-03-15 DIAGNOSIS — M160 Bilateral primary osteoarthritis of hip: Secondary | ICD-10-CM | POA: Diagnosis not present

## 2020-03-15 DIAGNOSIS — E1142 Type 2 diabetes mellitus with diabetic polyneuropathy: Secondary | ICD-10-CM | POA: Diagnosis not present

## 2020-03-15 DIAGNOSIS — E039 Hypothyroidism, unspecified: Secondary | ICD-10-CM | POA: Diagnosis not present

## 2020-03-15 DIAGNOSIS — F17210 Nicotine dependence, cigarettes, uncomplicated: Secondary | ICD-10-CM | POA: Diagnosis not present

## 2020-03-15 DIAGNOSIS — E1165 Type 2 diabetes mellitus with hyperglycemia: Secondary | ICD-10-CM | POA: Diagnosis not present

## 2020-03-15 DIAGNOSIS — R2681 Unsteadiness on feet: Secondary | ICD-10-CM | POA: Diagnosis not present

## 2020-03-15 DIAGNOSIS — F250 Schizoaffective disorder, bipolar type: Secondary | ICD-10-CM | POA: Diagnosis not present

## 2020-03-15 DIAGNOSIS — J449 Chronic obstructive pulmonary disease, unspecified: Secondary | ICD-10-CM | POA: Diagnosis not present

## 2020-03-15 NOTE — Telephone Encounter (Signed)
Pt is calling to get a wheelchair prescription called in to her insurance.

## 2020-03-15 NOTE — Telephone Encounter (Signed)
Please advise 

## 2020-03-16 NOTE — Telephone Encounter (Signed)
Can you ask patient where she wants the order sent to ([employer] is her insurance- not a [employer]) See if she wants the order sent to [facility] [employer] or [facility]. Thanks

## 2020-03-16 NOTE — Telephone Encounter (Signed)
Patient calling back asking about wc authorization to [facility].

## 2020-03-17 DIAGNOSIS — S8264XA Nondisplaced fracture of lateral malleolus of right fibula, initial encounter for closed fracture: Secondary | ICD-10-CM | POA: Diagnosis not present

## 2020-03-18 ENCOUNTER — Telehealth: Payer: Self-pay | Admitting: Internal Medicine

## 2020-03-18 NOTE — Telephone Encounter (Addendum)
Patient due for 6 month fu, apt made with Dr. Maple Hudson on 03/30/20 at 10am.   Spoke with Christoper Allegra about status of her CPAP replacement machine. They state that she registered her machine with [facility]. She will need to contact them to see where she is on the status of her new machine. Their number is [phone].

## 2020-03-19 ENCOUNTER — Other Ambulatory Visit: Payer: Self-pay

## 2020-03-19 DIAGNOSIS — M160 Bilateral primary osteoarthritis of hip: Secondary | ICD-10-CM | POA: Diagnosis not present

## 2020-03-19 DIAGNOSIS — F250 Schizoaffective disorder, bipolar type: Secondary | ICD-10-CM | POA: Diagnosis not present

## 2020-03-19 DIAGNOSIS — R2681 Unsteadiness on feet: Secondary | ICD-10-CM | POA: Diagnosis not present

## 2020-03-19 DIAGNOSIS — J449 Chronic obstructive pulmonary disease, unspecified: Secondary | ICD-10-CM | POA: Diagnosis not present

## 2020-03-19 DIAGNOSIS — E039 Hypothyroidism, unspecified: Secondary | ICD-10-CM | POA: Diagnosis not present

## 2020-03-19 DIAGNOSIS — E1165 Type 2 diabetes mellitus with hyperglycemia: Secondary | ICD-10-CM | POA: Diagnosis not present

## 2020-03-19 DIAGNOSIS — E1142 Type 2 diabetes mellitus with diabetic polyneuropathy: Secondary | ICD-10-CM | POA: Diagnosis not present

## 2020-03-19 DIAGNOSIS — F17210 Nicotine dependence, cigarettes, uncomplicated: Secondary | ICD-10-CM | POA: Diagnosis not present

## 2020-03-19 DIAGNOSIS — J42 Unspecified chronic bronchitis: Secondary | ICD-10-CM | POA: Diagnosis not present

## 2020-03-22 ENCOUNTER — Ambulatory Visit: Payer: Medicare HMO | Admitting: Internal Medicine

## 2020-03-22 NOTE — Telephone Encounter (Signed)
Recent visit is addened for wheelchair, it is a telephone visit

## 2020-03-22 NOTE — Telephone Encounter (Signed)
Pt requesting order for wheelchair to be sent to [facility]. Would also need last ov note addended

## 2020-03-22 NOTE — Assessment & Plan Note (Signed)
Multiple medications which increase unsteady gait and fall risk, wheelchair for inside and outside of the home needed

## 2020-03-22 NOTE — Telephone Encounter (Signed)
Pt would like RX for [facility] sent to [facility].

## 2020-03-22 NOTE — Telephone Encounter (Signed)
noted 

## 2020-03-22 NOTE — Assessment & Plan Note (Signed)
Lower extremity weakness, placing patient at increased fall risk, needs wheelchair for use both inside and outside of her home for improved quality of life and length of life

## 2020-03-23 ENCOUNTER — Other Ambulatory Visit: Payer: Self-pay

## 2020-03-23 DIAGNOSIS — R2681 Unsteadiness on feet: Secondary | ICD-10-CM | POA: Diagnosis not present

## 2020-03-23 DIAGNOSIS — E1142 Type 2 diabetes mellitus with diabetic polyneuropathy: Secondary | ICD-10-CM | POA: Diagnosis not present

## 2020-03-23 DIAGNOSIS — F250 Schizoaffective disorder, bipolar type: Secondary | ICD-10-CM | POA: Diagnosis not present

## 2020-03-23 DIAGNOSIS — E1165 Type 2 diabetes mellitus with hyperglycemia: Secondary | ICD-10-CM | POA: Diagnosis not present

## 2020-03-23 DIAGNOSIS — J42 Unspecified chronic bronchitis: Secondary | ICD-10-CM | POA: Diagnosis not present

## 2020-03-23 DIAGNOSIS — F17210 Nicotine dependence, cigarettes, uncomplicated: Secondary | ICD-10-CM | POA: Diagnosis not present

## 2020-03-23 DIAGNOSIS — E039 Hypothyroidism, unspecified: Secondary | ICD-10-CM | POA: Diagnosis not present

## 2020-03-23 DIAGNOSIS — J449 Chronic obstructive pulmonary disease, unspecified: Secondary | ICD-10-CM | POA: Diagnosis not present

## 2020-03-23 DIAGNOSIS — M160 Bilateral primary osteoarthritis of hip: Secondary | ICD-10-CM | POA: Diagnosis not present

## 2020-03-23 MED ORDER — UNABLE TO FIND: 0 refills | Status: DC

## 2020-03-23 NOTE — Telephone Encounter (Signed)
Order, ov notes and insurance info faxed to [facility]

## 2020-03-24 ENCOUNTER — Other Ambulatory Visit: Payer: Self-pay

## 2020-03-24 ENCOUNTER — Telehealth: Payer: Self-pay | Admitting: *Deleted

## 2020-03-24 ENCOUNTER — Ambulatory Visit (HOSPITAL_COMMUNITY): Payer: Medicare HMO | Admitting: Psychiatry

## 2020-03-24 DIAGNOSIS — M25512 Pain in left shoulder: Secondary | ICD-10-CM | POA: Diagnosis not present

## 2020-03-24 DIAGNOSIS — M25812 Other specified joint disorders, left shoulder: Secondary | ICD-10-CM | POA: Diagnosis not present

## 2020-03-24 DIAGNOSIS — R52 Pain, unspecified: Secondary | ICD-10-CM | POA: Diagnosis not present

## 2020-03-24 NOTE — Telephone Encounter (Signed)
Called and left message that this has been done and sent to [facility] yesterday

## 2020-03-24 NOTE — Telephone Encounter (Signed)
[facility] care manager with [facility] called following up on wheelchair referral. Pt can be contacted or [name] can be contacted she is [phone] ext [phone]

## 2020-03-25 ENCOUNTER — Other Ambulatory Visit: Payer: Self-pay

## 2020-03-25 ENCOUNTER — Ambulatory Visit: Payer: Medicare HMO | Admitting: Internal Medicine

## 2020-03-25 ENCOUNTER — Ambulatory Visit (INDEPENDENT_AMBULATORY_CARE_PROVIDER_SITE_OTHER): Payer: Medicare HMO | Admitting: Internal Medicine

## 2020-03-25 ENCOUNTER — Encounter: Payer: Self-pay | Admitting: Internal Medicine

## 2020-03-25 VITALS — BP 130/78 | HR 65 | Ht 59.0 in | Wt 171.0 lb

## 2020-03-25 DIAGNOSIS — E042 Nontoxic multinodular goiter: Secondary | ICD-10-CM | POA: Diagnosis not present

## 2020-03-25 DIAGNOSIS — E1165 Type 2 diabetes mellitus with hyperglycemia: Secondary | ICD-10-CM | POA: Diagnosis not present

## 2020-03-25 DIAGNOSIS — Z794 Long term (current) use of insulin: Secondary | ICD-10-CM | POA: Diagnosis not present

## 2020-03-25 DIAGNOSIS — E039 Hypothyroidism, unspecified: Secondary | ICD-10-CM

## 2020-03-25 LAB — POCT GLYCOSYLATED HEMOGLOBIN (HGB A1C): Hemoglobin A1C: 5.3 % (ref 4.0–5.6)

## 2020-03-25 NOTE — Patient Instructions (Signed)
Please continue: - Toujeo 28 units daily - Novolog 16 units for smaller meals 18 units for large meals  Please continue levothyroxine 50 mcg daily.  Take the thyroid hormone every day, with water, at least 30 minutes before breakfast, separated by at least 4 hours from: - acid reflux medications - calcium - iron - multivitamins  Please return in 4 months.

## 2020-03-25 NOTE — Progress Notes (Signed)
Patient ID: [name], female   DOB: 21-Mar-1950, 70 y.o.   MRN: [id number]  This visit occurred during the SARS-CoV-2 public health emergency.  Safety protocols were in place, including screening questions prior to the visit, additional usage of staff PPE, and extensive cleaning of exam room while observing appropriate contact time as indicated for disinfecting solutions.   HPI: [name] is a 63 y.o.-year-old female, initially referred by her PCP, [name], presenting for follow-up for DM2, dx 1995, insulin-dependent since 1997, uncontrolled, with complications (gastroparesis, cerebrovasc. Ds-  H/o stroke, PN),  Hypothyroidism, thyroid nodules.  Last visit 4.5 months ago.  She had a R ankle fracture 02/20/2020. Still painful - in brace.  Daily  DM2: Reviewed HbA1c levels: Lab Results  Component Value Date   HGBA1C 5.9 (A) 11/06/2019   HGBA1C 6.4 (A) 06/24/2019   HGBA1C 5.9 (H) 02/12/2019  10/28/2013: HbA1c 8.5%  She is on: - Toujeo 23 >> 28 units at bedtime - Novolog: 10-14 >> 16-18 units before a meal Could not tolerate Metformin >> nausea.   Insulin doses were decreased in 04/2017 due to low blood sugars in the 40s.  Pt checks her sugars 4 times a day per review of her log: - am: 196 >> 61, 78-188, 218 >> 71, 89-169, 184 >> 64, 95-146, 160, 170 - 2h after b'fast: n/c >> 110 >> n/c - before lunch:  91-155, 256, 348 >> 94-183, 241, 264 >> 79, 89-153, 178, 265 - 2h after lunch:  115 >> n/c >> 127, 215, 241 >> n/c - before dinner:  109-209, 303, 422 >> 101-171, 269, 378, 542 (steroids) >> 80-173, 181, 223 - 2h after dinner: n/c - bedtime: 130-202, 236 >> 89-201, 246, 488 (steroids) >> 91, 121-85, 204, 262 - nighttime: n/c >> 134 >> n/c Lowest sugar was 79 >> 61 x1 >> 71 >> 64 Tingling; she has hypoglycemia awareness in the 70s. Highest sugar was 241 >> 422  >> 542 (steroid inj) >> 265.  Pt's meals are: - Breakfast: eggs, cereals, fruit, oatmeal, bacon - Lunch: sandwich,  beef, mashed potatoes,diet soda - Dinner: chicken, beef, fish, potatoes, vegetables - Snacks: 1-2: fruit  -+ Mild CKD; last BUN/creatinine:  Lab Results  Component Value Date   BUN 32 (H) 02/20/2020   CREATININE 1.50 (H) 02/20/2020  On losartan. -+ HL; last set of lipids: Lab Results  Component Value Date   CHOL 125 09/23/2019   HDL 45 09/23/2019   LDLCALC 59 09/23/2019   TRIG 117 09/23/2019   CHOLHDL 3.5 09/30/2018  On Crestor and Zetia - last eye exam was in 2021: + DR; Had cataract sx B. Dr. Hassell Done [facility]). + L eye pain. -She has numbness and tingling mainly in the left leg-affected by stroke. Sees podiatry.  Hypothyroidism: -Diagnosed many years ago  She takes levothyroxine 50 mcg daily: - in am - fasting - at least 30 min from b'fast - no calcium - no iron - + multivitamins with lunch and Aciphex with lunch and dinner - no PPIs - +on Biotin and B complex  Reviewed her TFTs: Lab Results  Component Value Date   TSH 0.631 12/11/2019   TSH 0.94 02/17/2019   TSH 0.82 09/30/2018   TSH 0.52 10/08/2017   TSH 0.827 10/15/2016   Thyroid nodules.    Pt denies: - feeling nodules in neck - hoarseness - SOB with lying down But she continues to have chronic dysphagia with certain foods.  She had previous esophageal dilations.  She also has choking.  I reviewed previous imaging and biopsy tests: thyroid U/S (10/2013):  Right thyroid lobe: 3.7 x 1.0 x 1.8 cm   Left thyroid lobe: 3.5 x 1.5 x 1.6 cm   Isthmus: 0.5 cm   Focal nodules: There is a 0.3 mm calcified nodule in the right midzone. There is a 0.6 x 0.7 x 0.5 cm hypoechoic nodule in the  lateral aspect of the right midzone. There is a 0.7 x 0.6 x 0.5 cm hyperechoic nodule in the lateral aspect of the right lower pole.  There is a 2.2 x 1.1 x 1.7 cm complex solid nodule in the inferior aspect of the isthmus extending adjacent to the left lower pole.  There is a 1.4 x 2.1 x 1.4 cm complex solid nodule in the left  lower pole.   Lymphadenopathy: None visualized.  Repeat U/S (01/2014): Stable appearance of the nodules  FNA (2014) x2: Benign  Repeat U/S (10/2015): stable appearance of the nodules  Repeat U/S (10/2016): Stable appearance of the nodules  She had a L rib fracture in 12/2014.  Patient was admitted with AMS + sepsis on 10/15/2016.  She had aspiration pneumonia and acute respiratory failure  She has a recurrent meningioma >> seeing Dr. Rita Ohara.  She continues to have steroid injections in her left shoulder.  ROS: Constitutional: no weight gain/no weight loss, no fatigue, no subjective hyperthermia, no subjective hypothermia Eyes: no blurry vision, no xerophthalmia ENT: no sore throat, + see HPI Cardiovascular: no CP/no SOB/no palpitations/no leg swelling Respiratory: no cough/no SOB/no wheezing Gastrointestinal: no N/no V/no D/no C/no acid reflux Musculoskeletal: no muscle aches/+ joint aches Skin: no rashes, no hair loss Neurological: no tremors/+ numbness/+ tingling/no dizziness  I reviewed pt's medications, allergies, PMH, social hx, family hx, and changes were documented in the history of present illness. Otherwise, unchanged from my initial visit note.  Past Medical History:  Diagnosis Date  . Allergy   . Anemia   . Anxiety    takes Ativan daily  . Arthritis   . Assistance needed for mobility   . Bipolar disorder ([place])    takes Risperdal nightly  . Blood transfusion   . Brain tumor ([place])   . Cancer ([place])    In her gum  . Carpal tunnel syndrome of right wrist 05/23/2011  . Cervical disc disorder with radiculopathy of cervical region 10/31/2012  . Chronic back pain   . Chronic idiopathic constipation   . Chronic neck and back pain   . Colon polyps   . COPD (chronic obstructive pulmonary disease) with chronic bronchitis ([place]) 09/16/2013   Office Spirometry 10/30/2013-submaximal effort based on appearance of loop and curve. Numbers would fit with severe restriction  but her physiologic capability may be better than this. FVC 0.91/44%, and 10.74/45%, FEV1/FVC 0.81, FEF 25-75% 1.43/69%    . Diabetes mellitus    Type II  . Diverticulosis    TCS 9/08 by Dr. Delfin Edis for diarrhea . Bx for micro scopic colitis negative.   . Fibromyalgia   . Frequent falls   . GERD (gastroesophageal reflux disease)    takes Aciphex daily  . Glaucoma    eye drops daily  . Gum symptoms    infection on antibiotic  . Hemiplegia affecting non-dominant side, post-stroke 08/02/2011  . Hiatal hernia   . Hyperlipidemia    takes Crestor daily  . Hypertension    takes Amlodipine,Metoprolol,and Clonidine daily  . Hypothyroidism    takes Synthroid daily  .  IBS (irritable bowel syndrome)   . Insomnia    takes Trazodone nightly  . Major depression, recurrent ([place])    takes Zoloft daily  . 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 Metabolic encephalopathy 0/16/0109  . Migraines    chronic headaches  . Mononeuritis lower limb   . Narcolepsy   . Osteoporosis   . Pancreatitis 2006   due to Depakote with normal EUS   . Schatzki's ring    non critical / EGD with ED 8/2011with RMR  . Seizures ([place])    takes Lamictal daily.Last seizure 3 yrs ago  . Sleep apnea    on CPAP  . Small bowel obstruction ([place])   . Stroke [facility])    left sided weakness, speech changes  . Tubular adenoma of colon    Past Surgical History:  Procedure Laterality Date  . ABDOMINAL HYSTERECTOMY  1978  . BACK SURGERY  July 2012  . BACTERIAL OVERGROWTH TEST N/A 05/05/2013   Procedure: BACTERIAL OVERGROWTH TEST;  Surgeon: Daneil Dolin, MD;  [facility];  Service: Endoscopy;  Laterality: N/A;  7:30  . BIOPSY THYROID  2009  . BRAIN SURGERY  11/2011   resection of meningioma  . BREAST REDUCTION SURGERY  1994  . CARDIAC CATHETERIZATION  05/10/2005   normal coronaries, normal LV systolic function and EF (Dr. Jackie Plum)  . CARPAL TUNNEL RELEASE Left 07/22/04   Dr. Aline Brochure  . CATARACT EXTRACTION  Bilateral   . CHOLECYSTECTOMY  1984  . COLONOSCOPY N/A 09/25/2012   NAT:[id number] diverticulosis.  colonic polyp-removed : tubular adenoma  . CRANIOTOMY  11/23/2011   Procedure: CRANIOTOMY TUMOR EXCISION;  Surgeon: Hosie Spangle, MD;  Location: [place] NEURO ORS;  Service: Neurosurgery;  Laterality: N/A;  Craniotomy for tumor resection  . ESOPHAGOGASTRODUODENOSCOPY  12/29/2010   [place]-Retained food in the esophagus and stomach, small hiatal hernia, status post Maloney dilation of the esophagus  . ESOPHAGOGASTRODUODENOSCOPY N/A 09/25/2012   URK:YHCWCBJS atonic baggy esophagus status post Maloney dilation 49 F. Hiatal hernia  . GIVENS CAPSULE STUDY N/A 01/15/2013   NORMAL.   . IR GENERIC HISTORICAL  03/17/2016   IR RADIOLOGIST EVAL & MGMT 03/17/2016 [facility]-INTERV RAD  . LESION REMOVAL N/A 05/31/2015   Procedure: REMOVAL RIGHT AND LEFT LESIONS OF MANDIBLE;  Surgeon: Diona Browner, DDS;  Location: [place];  Service: Oral Surgery;  Laterality: N/A;  . MALONEY DILATION  12/29/2010   RMR;  . NM MYOCAR PERF WALL MOTION  2006   "relavtiely normal" persantine, mild anterior thinning (breast attenuation artifact), no region of scar/ischemia  . OVARIAN CYST REMOVAL    . RECTOCELE REPAIR N/A 06/29/2015   Procedure: POSTERIOR REPAIR (RECTOCELE);  Surgeon: Jonnie Kind, MD;  Location: [facility];  Service: Gynecology;  Laterality: N/A;  . REDUCTION MAMMAPLASTY Bilateral   . SPINE SURGERY  09/29/2010   Dr. Rolena Infante  . surgical excision of 3 tumors from right thigh and right buttock  and left upper thigh  2010  . TOOTH EXTRACTION Bilateral 12/14/2014   Procedure: REMOVAL OF BILATERAL MANDIBULAR EXOSTOSES;  Surgeon: Diona Browner, DDS;  Location: [place];  Service: Oral Surgery;  Laterality: Bilateral;  . TRANSTHORACIC ECHOCARDIOGRAM  2010   EF 60-65%, mild conc LVH, grade 1 diastolic dysfunction; mildly calcified MV annulus with mildly thickened leaflets, mildly calcified MR annulus   History   Social History  . Marital  Status: Divorced    Spouse Name: N/A    Number of Children: 1  . Years of Education:  12   Occupational History  . disabled     Social History Main Topics  . Smoking status: Current Every Day Smoker -- 0.25 packs/day for 7 years    Types: Cigarettes  . Smokeless tobacco: Never Used     Comment: "started back but off now for 3 months" (08/18/13)  . Alcohol Use: No     Comment:    . Drug Use: No   Current Outpatient Medications on File Prior to Visit  Medication Sig Dispense Refill  . albuterol (VENTOLIN HFA) 108 (90 Base) MCG/ACT inhaler INHALE 1 TO 2 PUFFS EVERY 6 HOURS AS NEEDED FOR WHEEZING, SHORTNESS OF BREATH 1 each 3  . Alcohol Swabs (B-D SINGLE USE SWABS REGULAR) PADS USE TO CLEAN FINGER PRIOR TO STICKING FOR BLOOD SUGAR. 100 each 11  . alendronate (FOSAMAX) 70 MG tablet TAKE 1 TABLET (70 MG TOTAL) BY MOUTH EVERY 7 (SEVEN) DAYS. TAKE WITH A FULL GLASS OF WATER ON AN EMPTY STOMACH. 12 tablet 3  . amLODipine (NORVASC) 10 MG tablet TAKE 1 TABLET EVERY DAY 90 tablet 3  . ascorbic acid (VITAMIN C) 500 MG tablet Take 500 mg by mouth daily.    [name] aspirin EC 81 MG tablet Take 1 tablet (81 mg total) by mouth daily with breakfast. 120 tablet 2  . azithromycin (ZITHROMAX) 250 MG tablet Take 1 tablet (250 mg total) by mouth as directed. (Patient not taking: Reported on 03/09/2020) 6 tablet 0  . benzonatate (TESSALON) 100 MG capsule Take 1 capsule (100 mg total) by mouth 2 (two) times daily as needed for cough. 20 capsule 0  . betamethasone dipropionate 0.05 % cream Apply topically 2 (two) times daily as needed (Rash). 45 g 3  . Blood Glucose Calibration (ACCU-CHEK GUIDE CONTROL) LIQD USE PRN TO CALIBRATE GLUCOMETER 3 each 11  . blood glucose meter kit and supplies Dispense based on patient and insurance preference. Use up to four times daily as directed. (FOR ICD-10 E10.9, E11.9). 1 each 0  . buPROPion (WELLBUTRIN XL) 150 MG 24 hr tablet Take 1 tablet (150 mg total) by mouth every morning. 90  tablet 2  . cetirizine (ZYRTEC) 10 MG tablet Take 1 tablet (10 mg total) by mouth daily. 7 tablet 0  . clobetasol cream (TEMOVATE) 3.50 % Apply 1 application topically 2 (two) times daily.     . Continuous Blood Gluc Sensor (FREESTYLE LIBRE 14 DAY SENSOR) MISC 1 each by Does not apply route every 14 (fourteen) days. Change every 2 weeks 2 each 11  . cromolyn (NASALCROM) 5.2 MG/ACT nasal spray Place 1 spray into both nostrils 4 (four) times daily. 13 mL 1  . diclofenac Sodium (VOLTAREN) 1 % GEL APPLY 2 GRAMS  TOPICALLY 4 (FOUR) TIMES DAILY. 800 g 0  . dicyclomine (BENTYL) 10 MG capsule Take 1 capsule (10 mg total) by mouth 3 (three) times daily as needed for spasms. 90 capsule 2  . ezetimibe (ZETIA) 10 MG tablet TAKE 1 TABLET EVERY DAY 90 tablet 1  . glucose blood (ACCU-CHEK GUIDE) test strip USE TO CHECK BLOOD SUGAR FOUR TIMES A DAY AND PRN 400 each 11  . insulin aspart (NOVOLOG FLEXPEN) 100 UNIT/ML FlexPen Inject 16 to 18 units under skin before meals up to 3 times a day 45 mL 3  . Insulin Pen Needle (SURE COMFORT PEN NEEDLES) 31G X 8 MM MISC USE TO for injecting insulin 4 TIMES DAILY. 200 each 3  . lamoTRIgine (LAMICTAL) 100 MG tablet Take 1 tablet (100  mg total) by mouth 2 (two) times daily. 180 tablet 2  . Lancets (ACCU-CHEK SOFT TOUCH) lancets Use as instructed to check blood sugar 3 times a day. 100 each 12  . levothyroxine (SYNTHROID) 50 MCG tablet TAKE 1 TABLET DAILY, EXCEPT TAKE 1/2 TABLET ON SUNDAY 85 tablet 1  . lidocaine (XYLOCAINE) 2 % solution RINSE WITH 15MLS AND SPIT OUT AFTER 30 SECONDS FOUR TIMES DAILY AS NEEDED FOR PAIN.    [name] LORazepam (ATIVAN) 0.5 MG tablet Take 1 tablet (0.5 mg total) by mouth 3 (three) times daily. 270 tablet 2  . losartan (COZAAR) 50 MG tablet TAKE 1 TABLET EVERY DAY 90 tablet 1  . metoprolol tartrate (LOPRESSOR) 50 MG tablet TAKE 1 TABLET TWICE DAILY (NEED MD APPOINTMENT) 180 tablet 3  . montelukast (SINGULAIR) 10 MG tablet TAKE 1 TABLET EVERY DAY 90 tablet  1  . nicotine polacrilex (COMMIT) 4 MG lozenge Take 1 lozenge (4 mg total) by mouth as needed for smoking cessation. 100 tablet 0  . nystatin (MYCOSTATIN/NYSTOP) powder APPLY TO AFFECTED AREA 4 TIMES DAILY. 90 g 1  . Omega-3 Fatty Acids (FISH OIL PO) Take 1 capsule by mouth daily.    . ondansetron (ZOFRAN) 4 MG tablet Take 1 tablet (4 mg total) by mouth every 8 (eight) hours as needed for nausea or vomiting. 20 tablet 0  . oxyCODONE-acetaminophen (PERCOCET) 10-325 MG tablet Take 1 tablet by mouth every 8 (eight) hours as needed.     [name] Plecanatide (TRULANCE) 3 MG TABS Take 3 mg by mouth daily. 30 tablet 3  . pregabalin (LYRICA) 75 MG capsule Take 1 capsule (75 mg total) by mouth daily.    . RABEprazole (ACIPHEX) 20 MG tablet TAKE 1 TABLET TWICE DAILY (NEED MD APPOINTMENT) 180 tablet 0  . risperiDONE (RISPERDAL) 0.5 MG tablet Take 1 tablet (0.5 mg total) by mouth at bedtime. 90 tablet 2  . rosuvastatin (CRESTOR) 5 MG tablet TAKE 1 TABLET AT BEDTIME 90 tablet 2  . sertraline (ZOLOFT) 100 MG tablet Take 1 tablet (100 mg total) by mouth daily. 90 tablet 2  . STIOLTO RESPIMAT 2.5-2.5 MCG/ACT AERS INHALE 2 PUFFS INTO THE LUNGS DAILY. 12 g 3  . TOUJEO MAX SOLOSTAR 300 UNIT/ML Solostar Pen INJECT 24 UNITS SUBCUTANEOUSLY INTO THE SKIN AT BEDTIME 12 mL 3  . traZODone (DESYREL) 100 MG tablet Take 1 tablet (100 mg total) by mouth at bedtime. 90 tablet 2  . UNABLE TO [facility] bed mattress x 1  DX: G47.33, J44.9 1 each 0  . UNABLE TO FIND Standard wheelchair Dx M47.16, R29.898 1 each 0   Current Facility-Administered Medications on File Prior to Visit  Medication Dose Route Frequency Provider Last Rate Last Admin  . meclizine (ANTIVERT) tablet 25 mg  25 mg Oral TID PRN Noreene Larsson, NP          Allergies  Allergen Reactions  . Cephalexin Hives  . Iron Nausea And Vomiting  . Milk-Related Compounds Other (See Comments)    Doesn't agree with stomach.   . Penicillins Hives       .  Phenazopyridine Hcl Hives         Family History  Problem Relation Age of Onset  . Heart attack Mother        HTN  . Pneumonia Father   . Kidney failure Father   . Diabetes Father   . Pancreatic cancer Sister   . Diabetes Brother   . Hypertension Brother   .  Diabetes Brother   . Cancer Sister        breast   . Hypertension Son   . Sleep apnea Son   . Cancer Sister        pancreatic  . Stroke Maternal Grandmother   . Heart attack Maternal Grandfather   . Alcohol abuse Maternal Uncle   . Colon cancer Neg Hx   . Anesthesia problems Neg Hx   . Hypotension Neg Hx   . Malignant hyperthermia Neg Hx   . Pseudochol deficiency Neg Hx   . Breast cancer Neg Hx    PE: BP 130/78   Pulse 65   Ht 4' 11"  (1.499 m)   Wt 171 lb (77.6 kg)   SpO2 98%   BMI 34.54 kg/m  Body mass index is 34.54 kg/m.  Wt Readings from Last 3 Encounters:  03/25/20 171 lb (77.6 kg)  02/20/20 170 lb (77.1 kg)  02/20/20 170 lb (77.1 kg)   Constitutional: overweight, in NAD Eyes: PERRLA, EOMI, no exophthalmos ENT: moist mucous membranes, no thyromegaly, no cervical lymphadenopathy Cardiovascular: RRR, No MRG Respiratory: CTA B Gastrointestinal: abdomen soft, NT, ND, BS+ Musculoskeletal: no deformities, strength intact in all 4 Skin: moist, warm, no rashes Neurological: no tremor with outstretched hands, DTR normal in all 4  ASSESSMENT: 1. DM2, insulin-dependent, uncontrolled, without complications - gastroparesis - cerebrovasc. Ds -  H/o stroke - PN  She was interested in an insulin pump >> discussed about [employer] (given brochure).  2. Hypothyroidism  3. MNG - Thyroid U/S (10/11/2012):  Right thyroid lobe: 3.7 x 1.0 x 1.8 cm  Left thyroid lobe: 3.5 x 1.5 x 1.6 cm  Isthmus: 0.5 cm  Focal nodules: There is a 0.3 mm calcified nodule in the right midzone. There is a 0.6 x 0.7 x 0.5 cm hypoechoic nodule in the lateral aspect of the right midzone. There is a 0.7 x 0.6 x 0.5 cm hyperechoic  nodule in the lateral aspect of the right lower pole. There is a 2.2 x 1.1 x 1.7 cm complex solid nodule in the inferior aspect of the isthmus extending adjacent to the left lower pole. There is a 1.4 x 2.1 x 1.4 cm complex solid nodule in the left lower pole.  Lymphadenopathy: None visualized.  IMPRESSION: Multi nodular goiter which has markedly progressed since the prior exam. The dominant nodule at the inferior aspect of the isthmus fits criteria for fine needle aspiration biopsy if not previously Assessed.  - FNA (11/05/2012) x2: benign  - Thyroid U/S (01/20/2014) - felt one nodule enlarging >> new U/S: stable appearance of the nodules, except a small new nodule in isthmus: 1.2 cm   Right thyroid lobe Measurements: 4.4 cm x 1.2 cm x 1.7 cm. Multiple right-sided nodules identified. Each right-sided nodule demonstrates increased echogenicity, with the superior measuring 7 mm -8 mm, most inferior measuring 9 mm - 10 mm. A small, 3 mm focus of calcium with posterior shadowing is evident. There is also a mid nodule measuring 7 mm, which is echogenic.  Left thyroid lobe Measurements: 5.1 cm x 2.1 cm x 2.3 cm. Dominant lesion at the inferior pole of left thyroid is again evident, which has been previously biopsied (10/29/2012). This nodule measures 1.8 cm x 1.7cm x 2.5 cm. (Previous 1.4 cm x 2.1 cm x 1.4 cm)  Isthmus Thickness: 4 mm-5 mm. Isthmic nodule again noted, previously biopsied (10/29/2012). Currently this nodule measures 2.2 cm x 1.4 cm x 1.8 cm. (previous measurement 2.2 cm x  1.1 cm x 1.7 cm). There is a new nodule identified within the isthmus with heterogeneously hyperechoic characteristics. This nodule measures 12 mm x 5.4 mm x 7.2 mm.  Lymphadenopathy: None visualized.  - Thyroid U/S (11/09/2015): Details   Reading Physician Reading Date Result Priority  Sandi Mariscal, MD 11/09/2015   Narrative    COMPARISON: Thyroid ultrasound - 01/20/2014 ;  10/11/2012  FINDINGS: Parenchymal Echotexture: Moderately heterogenous Estimated total number of nodules > 1 cm: <5 Number of spongiform nodules > 2 cm not described below (TR1): 0 Number of mixed cystic nodules > 1.5 cm not described below ([place]): 0 _____________________________________________________  Isthmus: 0.7 cm  Nodule # 1: Prior biopsy: No Location: Isthmus; left Size: 1.5 x 1.3 x 1.5 cm, previously 2.2 x 1.4 x 1.8 cm Composition: solid/almost completely solid (2) Echogenicity: hyperechoic (1) ACR TI-RADS total points: 3. Change in features: Yes. Increased internal cystic degeneration and smaller in size. Change in ACR TI-RADS risk category: No  *Given size (1.5 - 2.4 cm) and appearance, a follow-up ultrasound in 1 year is recommended based on TI-RADS criteria as clinically indicated.  _________________________________________________________  Right lobe: 4.6 x 1.1 x 1.9 cm, previously 4.4 x 1.2 x 1.7 cm Stable scattered subcentimeter echogenic nodules _________________________________________________________  Left lobe: 4.8 x 2.1 x 1.9 cm, previously 5.1 x 2.1 x 2.3 cm Nodule # 1: Prior biopsy: No Location: Left; Inferior Size: 2.4 x 1.7 x 1.9 cm, previously 2.5 x 1.7 x 1.7 Composition: solid/almost completely solid (2) Echogenicity: hyperechoic (1) ACR TI-RADS total points: 3. ACR TI-RADS risk category: TR3 (3 points). Change in features: No Change in ACR TI-RADS risk category: No *Given size (1.5 - 2.4 cm) and appearance, a follow-up ultrasound in 1 year is recommended based on TI-RADS criteria as clinically indicated.  IMPRESSION: TR 3 isthmic and left thyroid nodules unchanged. *Given size (1.5 - 2.4 cm) and appearance of both thyroid nodules, a follow-up ultrasound in 1 year is recommended based on TI-RADS criteria as clinically indicated.  The above is in keeping with the ACR TI-RADS recommendations Natasha Mead Coll Radiol 2017;14:[id number].         10/12/2016: Thyroid U/S: Parenchymal Echotexture: Moderately heterogenous Isthmus: 1.1 cm Right lobe: 3.8 x 1.0 x 1.7 cm Left lobe: 4.5 x 1.7 x 2.1 cm ______________________________________________________  Estimated total number of nodules >/= 1 cm: 3 _____________________________________________  Diffusely heterogeneous multinodular thyroid gland. As seen previously, there are multiple small echogenic nodules scattered throughout the right gland which do not meet criteria for biopsy or follow-up. Head and lobular pyramidal lobe extends superiorly from the thyroid isthmus. No interval change.  The previously biopsied nodule in the inferior aspect of the isthmus measures slightly smaller today at 1.5 x 1.0 x 1.4 cm compared to 1.7 x 1.3 x 1.5 cm previously.  The previously biopsied nodule in the left inferior gland is essentially unchanged at 2.6 x 1.3 x 1.5 cm compared to 2.4 x 1.8 x 1.9 cm. No new nodule or abnormality identified.  IMPRESSION: 1. Stable multinodular goiter with a prominent lobular parental lobe extending superiorly from the thyroid isthmus. 2. The previously biopsied nodule in the inferior isthmus measures slightly smaller on today's examination. 3. The previously biopsied nodule in the left inferior gland is stable. 4. Additional scattered small and benign-appearing nodules do not meet criteria for biopsy or continued follow-up. The above is in keeping with the ACR TI-RADS recommendations - J Am Coll Radiol 2017;14:[id number].   PLAN:  1. Patient with  longstanding, uncontrolled, type 2 diabetes, on basal-bolus insulin regimen, with fluctuating CBGs but much higher values when she gets steroid injections.  Latest HbA1c was excellent, at 5.9% in 10/2019.  At that time, reviewing her detailed sugar log, her blood sugars were mostly at goal with only occasional blood sugars above goal, but with the significant increase in blood sugar after her steroid  injection.  At that time, she called [name] and we increased her NovoLog dose. -At today's visit, we reviewed together her detailed blood sugar log.  Her sugars appear better control compared to last visit and even in the setting of steroid use, they did not increase past 200s.  These instances are rare, though and the sugars improve within 24 hours after steroid injection.  She has no significant low blood sugars, only one blood sugar at 64 in the last 3 months. -For now, I advised her to continue the current regimen. - I advised her to:  Patient Instructions  Please continue: - Toujeo 28 units daily - Novolog 16 units for smaller meals 18 units for large meals  Please continue levothyroxine 50 mcg daily.  Take the thyroid hormone every day, with water, at least 30 minutes before breakfast, separated by at least 4 hours from: - acid reflux medications - calcium - iron - multivitamins  Please return in 4 months.  - we checked her HbA1c: 5.3% (lower) - advised to check sugars at different times of the day - 4x a day, rotating check times - advised for yearly eye exams >> she is UTD - return to clinic in 4 months   2. Hypothyroidism - latest thyroid labs reviewed with pt >> normal: Lab Results  Component Value Date   TSH 0.631 12/11/2019   - she continues on LT4 50 mcg daily - pt feels good on this dose. - we discussed about taking the thyroid hormone every day, with water, >30 minutes before breakfast, separated by >4 hours from acid reflux medications, calcium, iron, multivitamins. Pt. is taking it correctly.  3. MNG -She has a history of 2 benign thyroid nodules biopsied in 2014 -Reviewed the thyroid ultrasound report from 2014-2018 and the nodules appear stable -No need to repeat thyroid ultrasound unless she starts developing new neck compression symptoms.  She has mild chronic dysphagia but does not have esophageal compression per the barium swallow study from 08/2014.  She had  esophageal dysmotility and had multiple esophageal dilations. -We will continue to follow this expectantly for now  Philemon Kingdom, MD PhD [facility] Endocr[facility]

## 2020-03-26 ENCOUNTER — Telehealth: Payer: Self-pay | Admitting: Family Medicine

## 2020-03-26 NOTE — Telephone Encounter (Signed)
Pls advise this will be handled next week. Pls request that her ins company provide a list of covered options, and remind her of the value of [id number]

## 2020-03-26 NOTE — Telephone Encounter (Signed)
Please advise 

## 2020-03-26 NOTE — Telephone Encounter (Signed)
Pt is calling and said she needs something called in to help her not want to smoke. Pt said last thing that was called in cost over $400. Pt would like it sent to [employer] [facility].

## 2020-03-29 NOTE — Progress Notes (Deleted)
HPI F Former smoker followed for obstructive sleep apnea with hypersomnia, , R/O'd Lung nodule,Wheezing dyspnea, complicated by hx resection of meningioma. Bipolar, GERD, DM, chronic epistaxis NPSG 3/8/13[name] [name]- mild obstructive sleep apnea, AHI 14 per hour, mostly in REM. Epworth sleepiness score 14/24. Weight 164 pounds. She is living alone, divorced and disabled. Son has OSA. History of meningioma 11/23/2011. No seizure in over 3 years. Does not drive Office Spirometry 10/30/2013-submaximal effort based on appearance of loop and curve. Numbers would fit with severe restriction but her physiologic capability may be better than this. FVC 0.91/44%, FEV1 0.74/45%, FEV1/FVC 0.81, FEF 25-75% 1.43/69%. Office Spirometry 07/27/2015-weak effort but still better than her last previous trial effort. Mild restriction of exhaled volume. FVC 1.35/67%, FEV1 1.27/79%, FEV1/FVC 0.94, FEF 25-75 percent 2.14/107% --------------------------------------------------------------------------------------------------------   09/18/19- 69 yoF smoker followed for obstructive sleep apnea with hypersomnia, , R/O'd Lung nodule,Wheezing dyspnea/ tobacco, complicated by hx resection of meningioma. Bipolar/ Depression, GERD, DM, chronic epistaxis  CPAP 5-15 auto/ [employer] ([employer]) Still smoking 5-10 cigs/ day Download compliance 30%, AHI 11.3/ hr  Used 16 days, not used 14 days Pressure ranges 6.3-9.8 High leak Patient arrived diaphoretic and weak- had not had lunch- Fingerstick CBG 52. Patient was given a cookie and sweet tea. Repeated CBG in 15 minutes: 123. Stiolto, singulair, Ventolin hfa,  Body weight today 166 lbs Orthopedist told her too many medical problems for rotator cuff repair- pain tough to bear. She blames poor mask fit for poor CPAP compliance. Difficult coping. She says DME told her to go online for mask help- that's a big ask for her. Says breathing ok and meds ok. No recent cough or discolored sputum. CT  from Nov reviewed. She would like to quit smoking but doesn't feel she can cope. Agrees to work with Pharmacy team. Hx XRT for meningioma- says now she has another one, being followed.  CT chest 01/16/2019- IMPRESSION: 1. No suspicious pulmonary nodule or mass on today's study. There is some focal chronic atelectasis or scarring in the lingula, not substantially changed since July of 2018. This likely accounts for the nodular opacity seen on the recent chest x-ray. 2.  Aortic Atherosclerois (ICD10-170.0)  03/30/20- 69 yoF smoker followed for obstructive sleep apnea with hypersomnia, , R/O'd Lung nodule,Wheezing dyspnea/ tobacco, complicated by hx resection of meningioma. Bipolar/ Depression, GERD, DM, chronic epistaxis  Nicotine lozenges, tessalon, Ventolin hfa, Stiolto 2.5,  CPAP 5-15 auto/ [employer] ([employer]) Still smoking 5-10 cigs/ day  (1-800-QUITNOW) Download- Body weight today- Covid vax- Flu vax- She was to call [employer] to check on status re replacement of recalled machine.[phone])  CXR 02/20/20- 1V- IMPRESSION: No active disease.  ROS-see HPI + = positive Constitutional:   No-   weight loss, night sweats, fevers, chills, +fatigue, lassitude. HEENT:  + headaches, +difficulty swallowing, tooth/dental problems, sore throat,       No-sneezing, itching, ear ache, nasal congestion, post nasal drip,  CV:  chest pain, orthopnea, PND, swelling in lower extremities, anasarca, dizziness, +palpitations Resp: +  shortness of breath with exertion or at rest.                productive cough,   non-productive cough,  No- coughing up of blood.              No-   change in color of mucus.   wheezing.   Skin: No-   rash or lesions. GI:  +heartburn, +indigestion, no-abdominal pain, nausea, vomiting,  GU:  MS:  +  joint pain or swelling.   Neuro-     nothing unusual Psych:  No- change in mood or affect. +depression or +anxiety.  No memory loss.  OBJ- Physical Exam - + she  speaks slowly but seems alert and appropriate,  General-  Oriented, Affect-flat, Distress- none acute Skin- rash-none, lesions- none, excoriation- none Lymphadenopathy- none Head- Eyes- Gross vision intact, PERRLA, conjunctivae and secretions clear            Ears- Hearing, canals-normal + = positive            Nose- no-Septal dev, polyps, erosion, perforation             Throat- Mallampati III , mucosa clear , drainage- none, tonsils- atrophic,  + dentures Neck- flexible , trachea midline, no stridor , thyroid nl, carotid no bruit Chest - symmetrical excursion , unlabored           Heart/CV- RRR , no murmur , no gallop  , no rub, nl s1 s2                           - JVD- none , edema- none, stasis changes- none, varices- none           Lung- clear to P&A, wheeze- none, cough- mild , dullness-none, rub- none           Chest wall-  Abd-  Br/ Gen/ Rectal- Not done, not indicated Extrem- cyanosis- none, clubbing, none, atrophy- none, strength- nl. +cane Neuro- + seems fully oriented but vague. Speech pattern is slow.

## 2020-03-30 ENCOUNTER — Telehealth: Payer: Self-pay

## 2020-03-30 ENCOUNTER — Ambulatory Visit: Payer: Medicare HMO | Admitting: Internal Medicine

## 2020-03-30 DIAGNOSIS — E1165 Type 2 diabetes mellitus with hyperglycemia: Secondary | ICD-10-CM | POA: Diagnosis not present

## 2020-03-30 DIAGNOSIS — E1142 Type 2 diabetes mellitus with diabetic polyneuropathy: Secondary | ICD-10-CM | POA: Diagnosis not present

## 2020-03-30 DIAGNOSIS — E039 Hypothyroidism, unspecified: Secondary | ICD-10-CM | POA: Diagnosis not present

## 2020-03-30 DIAGNOSIS — J42 Unspecified chronic bronchitis: Secondary | ICD-10-CM | POA: Diagnosis not present

## 2020-03-30 DIAGNOSIS — F250 Schizoaffective disorder, bipolar type: Secondary | ICD-10-CM | POA: Diagnosis not present

## 2020-03-30 DIAGNOSIS — R2681 Unsteadiness on feet: Secondary | ICD-10-CM | POA: Diagnosis not present

## 2020-03-30 DIAGNOSIS — J449 Chronic obstructive pulmonary disease, unspecified: Secondary | ICD-10-CM | POA: Diagnosis not present

## 2020-03-30 DIAGNOSIS — F17210 Nicotine dependence, cigarettes, uncomplicated: Secondary | ICD-10-CM | POA: Diagnosis not present

## 2020-03-30 DIAGNOSIS — M160 Bilateral primary osteoarthritis of hip: Secondary | ICD-10-CM | POA: Diagnosis not present

## 2020-03-30 NOTE — Telephone Encounter (Signed)
With her dx of depression, she will need to ask Dr Harrington Challenger to prescribe this if she is willing to do so, please let her know

## 2020-03-30 NOTE — Telephone Encounter (Signed)
Pt is wanting chantix called in

## 2020-03-31 DIAGNOSIS — R2681 Unsteadiness on feet: Secondary | ICD-10-CM | POA: Diagnosis not present

## 2020-03-31 DIAGNOSIS — E039 Hypothyroidism, unspecified: Secondary | ICD-10-CM | POA: Diagnosis not present

## 2020-03-31 DIAGNOSIS — E1165 Type 2 diabetes mellitus with hyperglycemia: Secondary | ICD-10-CM | POA: Diagnosis not present

## 2020-03-31 DIAGNOSIS — J449 Chronic obstructive pulmonary disease, unspecified: Secondary | ICD-10-CM | POA: Diagnosis not present

## 2020-03-31 DIAGNOSIS — E1142 Type 2 diabetes mellitus with diabetic polyneuropathy: Secondary | ICD-10-CM | POA: Diagnosis not present

## 2020-03-31 DIAGNOSIS — J42 Unspecified chronic bronchitis: Secondary | ICD-10-CM | POA: Diagnosis not present

## 2020-03-31 DIAGNOSIS — F17210 Nicotine dependence, cigarettes, uncomplicated: Secondary | ICD-10-CM | POA: Diagnosis not present

## 2020-03-31 DIAGNOSIS — M160 Bilateral primary osteoarthritis of hip: Secondary | ICD-10-CM | POA: Diagnosis not present

## 2020-03-31 DIAGNOSIS — F250 Schizoaffective disorder, bipolar type: Secondary | ICD-10-CM | POA: Diagnosis not present

## 2020-04-01 DIAGNOSIS — M48061 Spinal stenosis, lumbar region without neurogenic claudication: Secondary | ICD-10-CM | POA: Diagnosis not present

## 2020-04-01 DIAGNOSIS — Z79899 Other long term (current) drug therapy: Secondary | ICD-10-CM | POA: Diagnosis not present

## 2020-04-01 DIAGNOSIS — G8929 Other chronic pain: Secondary | ICD-10-CM | POA: Diagnosis not present

## 2020-04-01 DIAGNOSIS — M961 Postlaminectomy syndrome, not elsewhere classified: Secondary | ICD-10-CM | POA: Diagnosis not present

## 2020-04-01 DIAGNOSIS — M792 Neuralgia and neuritis, unspecified: Secondary | ICD-10-CM | POA: Diagnosis not present

## 2020-04-01 DIAGNOSIS — M47816 Spondylosis without myelopathy or radiculopathy, lumbar region: Secondary | ICD-10-CM | POA: Diagnosis not present

## 2020-04-01 DIAGNOSIS — M25512 Pain in left shoulder: Secondary | ICD-10-CM | POA: Diagnosis not present

## 2020-04-01 DIAGNOSIS — M25571 Pain in right ankle and joints of right foot: Secondary | ICD-10-CM | POA: Diagnosis not present

## 2020-04-01 DIAGNOSIS — Z5181 Encounter for therapeutic drug level monitoring: Secondary | ICD-10-CM | POA: Diagnosis not present

## 2020-04-05 DIAGNOSIS — F17210 Nicotine dependence, cigarettes, uncomplicated: Secondary | ICD-10-CM | POA: Diagnosis not present

## 2020-04-05 DIAGNOSIS — E1165 Type 2 diabetes mellitus with hyperglycemia: Secondary | ICD-10-CM | POA: Diagnosis not present

## 2020-04-05 DIAGNOSIS — J42 Unspecified chronic bronchitis: Secondary | ICD-10-CM | POA: Diagnosis not present

## 2020-04-05 DIAGNOSIS — E1142 Type 2 diabetes mellitus with diabetic polyneuropathy: Secondary | ICD-10-CM | POA: Diagnosis not present

## 2020-04-05 DIAGNOSIS — F250 Schizoaffective disorder, bipolar type: Secondary | ICD-10-CM | POA: Diagnosis not present

## 2020-04-05 DIAGNOSIS — R2681 Unsteadiness on feet: Secondary | ICD-10-CM | POA: Diagnosis not present

## 2020-04-05 DIAGNOSIS — E039 Hypothyroidism, unspecified: Secondary | ICD-10-CM | POA: Diagnosis not present

## 2020-04-05 DIAGNOSIS — J449 Chronic obstructive pulmonary disease, unspecified: Secondary | ICD-10-CM | POA: Diagnosis not present

## 2020-04-05 DIAGNOSIS — M160 Bilateral primary osteoarthritis of hip: Secondary | ICD-10-CM | POA: Diagnosis not present

## 2020-04-06 DIAGNOSIS — J449 Chronic obstructive pulmonary disease, unspecified: Secondary | ICD-10-CM | POA: Diagnosis not present

## 2020-04-06 DIAGNOSIS — R2681 Unsteadiness on feet: Secondary | ICD-10-CM | POA: Diagnosis not present

## 2020-04-06 DIAGNOSIS — M160 Bilateral primary osteoarthritis of hip: Secondary | ICD-10-CM | POA: Diagnosis not present

## 2020-04-06 DIAGNOSIS — E039 Hypothyroidism, unspecified: Secondary | ICD-10-CM | POA: Diagnosis not present

## 2020-04-06 DIAGNOSIS — F250 Schizoaffective disorder, bipolar type: Secondary | ICD-10-CM | POA: Diagnosis not present

## 2020-04-06 DIAGNOSIS — E1142 Type 2 diabetes mellitus with diabetic polyneuropathy: Secondary | ICD-10-CM | POA: Diagnosis not present

## 2020-04-06 DIAGNOSIS — J42 Unspecified chronic bronchitis: Secondary | ICD-10-CM | POA: Diagnosis not present

## 2020-04-06 DIAGNOSIS — E1165 Type 2 diabetes mellitus with hyperglycemia: Secondary | ICD-10-CM | POA: Diagnosis not present

## 2020-04-06 DIAGNOSIS — F17210 Nicotine dependence, cigarettes, uncomplicated: Secondary | ICD-10-CM | POA: Diagnosis not present

## 2020-04-07 ENCOUNTER — Ambulatory Visit (INDEPENDENT_AMBULATORY_CARE_PROVIDER_SITE_OTHER): Payer: Medicare HMO | Admitting: Psychiatry

## 2020-04-07 ENCOUNTER — Other Ambulatory Visit: Payer: Self-pay

## 2020-04-07 DIAGNOSIS — F331 Major depressive disorder, recurrent, moderate: Secondary | ICD-10-CM

## 2020-04-07 NOTE — Telephone Encounter (Signed)
Patient aware.

## 2020-04-07 NOTE — Progress Notes (Signed)
Virtual Visit via Telephone Note  I connected with [name] on 04/07/20 at 4:10 PM EST  by telephone and verified that I am speaking with the correct person using two identifiers.  Location: Patient: Home Provider: [place] office    I discussed the limitations, risks, security and privacy concerns of performing an evaluation and management service by telephone and the availability of in person appointments. I also discussed with the patient that there may be a patient responsible charge related to this service. The patient expressed understanding and agreed to proceed.    I provided 42 minutes of non-face-to-face time during this encounter.   [name], LCSW             THERAPIST PROGRESS NOTE    Session Time: Wednesday 04/07/2020 4:10 PM -  4:52 PM         Participation Level: Active  Behavioral Response:  Less depressed, anxious, talkative, alert   Type of Therapy: Individual Therapy  Treatment Goals addressed: Patient wants to learn how to improve coping skills to manage chronic pain and health issues/ improve mood    Interventions: CBT and Supportive   Summary: [name] is a 70 y.o. female who presents with  long standing history of recurrent periods  of depression beginning when she was [age] and her favorite uncle died. Patient reports multiple psychiatric hospitlaizations due to depression and suicidal ideations with the last one occuring in 1997. Patient has participated in outpatient psychotherapy and medication management intermittently since age 58. She currently is seeing psychiatrist Dr. Harrington Challenger . Prior to this, she was seen at [facility]. Patient also has had ECT at [facility]. Symptoms have worsened in recent months due to family stress and have  included depressed mood, anxiety, excessive worry, and tearfulness.         Patient's last contact was by virtual visit via telephone about 6 weeks ago. She reports less depressed mood but increased  anxiety due to medical records indicating she had an accidental overdose of opiates in December.  She adamantly denies taking more medication than prescribed.  She reports she had reduced her use of oxycodone until recently even though she was experiencing significant pain as she fears providers will think she abuses the medication.  She reports she has resumed taking the prescribed dosage of pain medication as she was experiencing increased sleep difficulty due to the pain.  She is scheduled to see her pain management doctor this Friday and expresses anxiety about how he may look at her medical records.  Patient reports experiencing sleep difficulty as a result of the pain.  She reports trying to use distracting activities including reading, watching television, and talking to others to try to cope with the pain.  She also is using a heating pad, cold compress, and pain creams.  She also is receiving physical therapy at home to help manage pain.     Suicidal/Homicidal: Nowithout intent/plan  Therapist Response: Reviewed symptoms, discussed stressors, facilitated expression of thoughts and feelings, validated feelings, praised and reinforced patient's efforts to manage pain, assisted patient identify ways to express her concerns to her pain management doctor, Plan: Return again in 2 weeks.  Diagnosis: Axis I: MDD, Recurrent, moderate    Axis II: Deferred    Zayne Marovich E Jamarian Jacinto, LCSW

## 2020-04-08 DIAGNOSIS — M160 Bilateral primary osteoarthritis of hip: Secondary | ICD-10-CM | POA: Diagnosis not present

## 2020-04-08 DIAGNOSIS — R2681 Unsteadiness on feet: Secondary | ICD-10-CM | POA: Diagnosis not present

## 2020-04-08 DIAGNOSIS — F17210 Nicotine dependence, cigarettes, uncomplicated: Secondary | ICD-10-CM | POA: Diagnosis not present

## 2020-04-08 DIAGNOSIS — J449 Chronic obstructive pulmonary disease, unspecified: Secondary | ICD-10-CM | POA: Diagnosis not present

## 2020-04-08 DIAGNOSIS — F250 Schizoaffective disorder, bipolar type: Secondary | ICD-10-CM | POA: Diagnosis not present

## 2020-04-08 DIAGNOSIS — E039 Hypothyroidism, unspecified: Secondary | ICD-10-CM | POA: Diagnosis not present

## 2020-04-08 DIAGNOSIS — J42 Unspecified chronic bronchitis: Secondary | ICD-10-CM | POA: Diagnosis not present

## 2020-04-08 DIAGNOSIS — E1165 Type 2 diabetes mellitus with hyperglycemia: Secondary | ICD-10-CM | POA: Diagnosis not present

## 2020-04-08 DIAGNOSIS — E1142 Type 2 diabetes mellitus with diabetic polyneuropathy: Secondary | ICD-10-CM | POA: Diagnosis not present

## 2020-04-09 DIAGNOSIS — Z79899 Other long term (current) drug therapy: Secondary | ICD-10-CM | POA: Diagnosis not present

## 2020-04-13 DIAGNOSIS — R2681 Unsteadiness on feet: Secondary | ICD-10-CM | POA: Diagnosis not present

## 2020-04-13 DIAGNOSIS — E1142 Type 2 diabetes mellitus with diabetic polyneuropathy: Secondary | ICD-10-CM | POA: Diagnosis not present

## 2020-04-13 DIAGNOSIS — E039 Hypothyroidism, unspecified: Secondary | ICD-10-CM | POA: Diagnosis not present

## 2020-04-13 DIAGNOSIS — Z794 Long term (current) use of insulin: Secondary | ICD-10-CM

## 2020-04-13 DIAGNOSIS — E1165 Type 2 diabetes mellitus with hyperglycemia: Secondary | ICD-10-CM | POA: Diagnosis not present

## 2020-04-13 DIAGNOSIS — M160 Bilateral primary osteoarthritis of hip: Secondary | ICD-10-CM | POA: Diagnosis not present

## 2020-04-13 DIAGNOSIS — I10 Essential (primary) hypertension: Secondary | ICD-10-CM | POA: Diagnosis not present

## 2020-04-13 DIAGNOSIS — J449 Chronic obstructive pulmonary disease, unspecified: Secondary | ICD-10-CM | POA: Diagnosis not present

## 2020-04-13 DIAGNOSIS — J42 Unspecified chronic bronchitis: Secondary | ICD-10-CM | POA: Diagnosis not present

## 2020-04-13 DIAGNOSIS — Z79899 Other long term (current) drug therapy: Secondary | ICD-10-CM

## 2020-04-13 DIAGNOSIS — K5730 Diverticulosis of large intestine without perforation or abscess without bleeding: Secondary | ICD-10-CM

## 2020-04-13 DIAGNOSIS — F250 Schizoaffective disorder, bipolar type: Secondary | ICD-10-CM | POA: Diagnosis not present

## 2020-04-13 DIAGNOSIS — Z7982 Long term (current) use of aspirin: Secondary | ICD-10-CM

## 2020-04-13 DIAGNOSIS — K589 Irritable bowel syndrome without diarrhea: Secondary | ICD-10-CM

## 2020-04-13 DIAGNOSIS — R296 Repeated falls: Secondary | ICD-10-CM

## 2020-04-13 DIAGNOSIS — F17210 Nicotine dependence, cigarettes, uncomplicated: Secondary | ICD-10-CM | POA: Diagnosis not present

## 2020-04-13 DIAGNOSIS — D649 Anemia, unspecified: Secondary | ICD-10-CM

## 2020-04-14 DIAGNOSIS — S8264XA Nondisplaced fracture of lateral malleolus of right fibula, initial encounter for closed fracture: Secondary | ICD-10-CM | POA: Diagnosis not present

## 2020-04-16 ENCOUNTER — Other Ambulatory Visit: Payer: Self-pay | Admitting: Internal Medicine

## 2020-04-16 DIAGNOSIS — J42 Unspecified chronic bronchitis: Secondary | ICD-10-CM | POA: Diagnosis not present

## 2020-04-16 DIAGNOSIS — R2681 Unsteadiness on feet: Secondary | ICD-10-CM | POA: Diagnosis not present

## 2020-04-16 DIAGNOSIS — E1142 Type 2 diabetes mellitus with diabetic polyneuropathy: Secondary | ICD-10-CM | POA: Diagnosis not present

## 2020-04-16 DIAGNOSIS — E039 Hypothyroidism, unspecified: Secondary | ICD-10-CM | POA: Diagnosis not present

## 2020-04-16 DIAGNOSIS — F17210 Nicotine dependence, cigarettes, uncomplicated: Secondary | ICD-10-CM | POA: Diagnosis not present

## 2020-04-16 DIAGNOSIS — J449 Chronic obstructive pulmonary disease, unspecified: Secondary | ICD-10-CM | POA: Diagnosis not present

## 2020-04-16 DIAGNOSIS — E1165 Type 2 diabetes mellitus with hyperglycemia: Secondary | ICD-10-CM | POA: Diagnosis not present

## 2020-04-16 DIAGNOSIS — M160 Bilateral primary osteoarthritis of hip: Secondary | ICD-10-CM | POA: Diagnosis not present

## 2020-04-16 DIAGNOSIS — F250 Schizoaffective disorder, bipolar type: Secondary | ICD-10-CM | POA: Diagnosis not present

## 2020-04-19 ENCOUNTER — Encounter (HOSPITAL_COMMUNITY): Payer: Self-pay | Admitting: Psychiatry

## 2020-04-19 ENCOUNTER — Telehealth: Payer: Self-pay

## 2020-04-19 ENCOUNTER — Other Ambulatory Visit: Payer: Self-pay

## 2020-04-19 ENCOUNTER — Telehealth (INDEPENDENT_AMBULATORY_CARE_PROVIDER_SITE_OTHER): Payer: Medicare HMO | Admitting: Psychiatry

## 2020-04-19 ENCOUNTER — Encounter: Payer: Self-pay | Admitting: Family Medicine

## 2020-04-19 ENCOUNTER — Telehealth: Payer: Self-pay | Admitting: Dermatology

## 2020-04-19 ENCOUNTER — Other Ambulatory Visit: Payer: Self-pay | Admitting: Internal Medicine

## 2020-04-19 DIAGNOSIS — D649 Anemia, unspecified: Secondary | ICD-10-CM | POA: Diagnosis not present

## 2020-04-19 DIAGNOSIS — E039 Hypothyroidism, unspecified: Secondary | ICD-10-CM | POA: Diagnosis not present

## 2020-04-19 DIAGNOSIS — R2681 Unsteadiness on feet: Secondary | ICD-10-CM | POA: Diagnosis not present

## 2020-04-19 DIAGNOSIS — F17210 Nicotine dependence, cigarettes, uncomplicated: Secondary | ICD-10-CM | POA: Diagnosis not present

## 2020-04-19 DIAGNOSIS — F331 Major depressive disorder, recurrent, moderate: Secondary | ICD-10-CM | POA: Diagnosis not present

## 2020-04-19 DIAGNOSIS — F250 Schizoaffective disorder, bipolar type: Secondary | ICD-10-CM | POA: Diagnosis not present

## 2020-04-19 DIAGNOSIS — M160 Bilateral primary osteoarthritis of hip: Secondary | ICD-10-CM | POA: Diagnosis not present

## 2020-04-19 DIAGNOSIS — E1165 Type 2 diabetes mellitus with hyperglycemia: Secondary | ICD-10-CM | POA: Diagnosis not present

## 2020-04-19 DIAGNOSIS — J449 Chronic obstructive pulmonary disease, unspecified: Secondary | ICD-10-CM | POA: Diagnosis not present

## 2020-04-19 DIAGNOSIS — E1142 Type 2 diabetes mellitus with diabetic polyneuropathy: Secondary | ICD-10-CM | POA: Diagnosis not present

## 2020-04-19 DIAGNOSIS — J42 Unspecified chronic bronchitis: Secondary | ICD-10-CM | POA: Diagnosis not present

## 2020-04-19 MED ORDER — LORAZEPAM 0.5 MG PO TABS: 0.5000 mg | ORAL_TABLET | Freq: Two times a day (BID) | ORAL | 0 refills | Status: DC

## 2020-04-19 MED ORDER — SERTRALINE HCL 100 MG PO TABS
100.0000 mg | ORAL_TABLET | Freq: Every day | ORAL | 2 refills | Status: DC
Start: 2020-04-19 — End: 2020-05-17

## 2020-04-19 MED ORDER — TRAZODONE HCL 100 MG PO TABS: 100.0000 mg | ORAL_TABLET | Freq: Every day | ORAL | 2 refills | Status: DC

## 2020-04-19 MED ORDER — LAMOTRIGINE 100 MG PO TABS: 100.0000 mg | ORAL_TABLET | Freq: Two times a day (BID) | ORAL | 2 refills | Status: DC

## 2020-04-19 MED ORDER — BUPROPION HCL ER (XL) 150 MG PO TB24: 150.0000 mg | ORAL_TABLET | ORAL | 2 refills | Status: DC

## 2020-04-19 MED ORDER — RISPERIDONE 0.5 MG PO TABS: 0.5000 mg | ORAL_TABLET | Freq: Every day | ORAL | 2 refills | Status: DC

## 2020-04-19 NOTE — Telephone Encounter (Signed)
Please put pt in for phone visit with dr Moshe Cipro

## 2020-04-19 NOTE — Progress Notes (Signed)
Virtual Visit via Telephone Note  I connected with [name] on 04/19/20 at  3:20 PM EST by telephone and verified that I am speaking with the correct person using two identifiers.  Location: Patient: home Provider: home   I discussed the limitations, risks, security and privacy concerns of performing an evaluation and management service by telephone and the availability of in person appointments. I also discussed with the patient that there may be a patient responsible charge related to this service. The patient expressed understanding and agreed to proceed.   I discussed the assessment and treatment plan with the patient. The patient was provided an opportunity to ask questions and all were answered. The patient agreed with the plan and demonstrated an understanding of the instructions.   The patient was advised to call back or seek an in-person evaluation if the symptoms worsen or if the condition fails to improve as anticipated.  I provided 15 minutes of non-face-to-face time during this encounter.   Levonne Spiller, MD  [facility] MD/PA/NP OP Progress Note  04/19/2020 4:01 PM [name]  MRN:  [id number]  Chief Complaint:  Chief Complaint    Anxiety; Depression; Follow-up     HPI: This patient is a 70 year old divorced black female who lives alone in [place].  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  The patient returns for follow-up after 3 months for history of depression and anxiety.  I noted that she took a fall back in December and when she was seen at the urgent care she seemed so out of it that she was sent to the emergency room.  In the emergency room she was very lethargic and sleepy and her urine drug test was positive for both opiates and benzodiazepines.  She ended up having a broken ankle.  I spoke to her about the dangers of combining these 2 types of medications.  She became very defensive about it but I explained I am not blaming her but  that these medicines taken together can be harmful in someone in her age group.  I am going to cut down the dose of the Ativan.  The patient states that she is not seriously depressed or suicidal but very annoyed with various family members as usual.  She states that at times she does not sleep well up because of pain.  She is taking the trazodone.  She is also on Risperdal with rectal Wellbutrin and Zoloft.  I think over the next few months we need to look at decreasing the polypharmacy. Visit Diagnosis:    ICD-10-CM   1. Major depressive disorder, recurrent episode, moderate (HCC)  F33.1     Past Psychiatric History: Numerous hospitalizations for depression years ago including ECT treatment  Past Medical History:  Past Medical History:  Diagnosis Date  . Allergy   . Anemia   . Anxiety    takes Ativan daily  . Arthritis   . Assistance needed for mobility   . Bipolar disorder ([place])    takes Risperdal nightly  . Blood transfusion   . Brain tumor ([place])   . Cancer ([place])    In her gum  . Carpal tunnel syndrome of right wrist 05/23/2011  . Cervical disc disorder with radiculopathy of cervical region 10/31/2012  . Chronic back pain   . Chronic idiopathic constipation   . Chronic neck and back pain   . Colon polyps   . COPD (chronic obstructive pulmonary disease)  with chronic bronchitis ([place]) 09/16/2013   Office Spirometry 10/30/2013-submaximal effort based on appearance of loop and curve. Numbers would fit with severe restriction but her physiologic capability may be better than this. FVC 0.91/44%, and 10.74/45%, FEV1/FVC 0.81, FEF 25-75% 1.43/69%    . Diabetes mellitus    Type II  . Diverticulosis    TCS 9/08 by Dr. Delfin Edis for diarrhea . Bx for micro scopic colitis negative.   . Fibromyalgia   . Frequent falls   . GERD (gastroesophageal reflux disease)    takes Aciphex daily  . Glaucoma    eye drops daily  . Gum symptoms    infection on antibiotic  . Hemiplegia affecting  non-dominant side, post-stroke 08/02/2011  . Hiatal hernia   . Hyperlipidemia    takes Crestor daily  . Hypertension    takes Amlodipine,Metoprolol,and Clonidine daily  . Hypothyroidism    takes Synthroid daily  . IBS (irritable bowel syndrome)   . Insomnia    takes Trazodone nightly  . Major depression, recurrent ([facility])    takes Zoloft daily  . 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 Metabolic encephalopathy 4/62/7035  . Migraines    chronic headaches  . Mononeuritis lower limb   . Narcolepsy   . Osteoporosis   . Pancreatitis 2006   due to Depakote with normal EUS   . Schatzki's ring    non critical / EGD with ED 8/2011with RMR  . Seizures ([place])    takes Lamictal daily.Last seizure 3 yrs ago  . Sleep apnea    on CPAP  . Small bowel obstruction ([place])   . Stroke [facility])    left sided weakness, speech changes  . Tubular adenoma of colon     Past Surgical History:  Procedure Laterality Date  . ABDOMINAL HYSTERECTOMY  1978  . BACK SURGERY  July 2012  . BACTERIAL OVERGROWTH TEST N/A 05/05/2013   Procedure: BACTERIAL OVERGROWTH TEST;  Surgeon: Daneil Dolin, MD;  [facility];  Service: Endoscopy;  Laterality: N/A;  7:30  . BIOPSY THYROID  2009  . BRAIN SURGERY  11/2011   resection of meningioma  . BREAST REDUCTION SURGERY  1994  . CARDIAC CATHETERIZATION  05/10/2005   normal coronaries, normal LV systolic function and EF (Dr. Jackie Plum)  . CARPAL TUNNEL RELEASE Left 07/22/04   Dr. Aline Brochure  . CATARACT EXTRACTION Bilateral   . CHOLECYSTECTOMY  1984  . COLONOSCOPY N/A 09/25/2012   KKX:FGHWEXH diverticulosis.  colonic polyp-removed : tubular adenoma  . CRANIOTOMY  11/23/2011   Procedure: CRANIOTOMY TUMOR EXCISION;  Surgeon: Hosie Spangle, MD;  Location: [facility] NEURO ORS;  Service: Neurosurgery;  Laterality: N/A;  Craniotomy for tumor resection  . ESOPHAGOGASTRODUODENOSCOPY  12/29/2010   Rourk-Retained food in the esophagus and stomach, small hiatal hernia, status post  Maloney dilation of the esophagus  . ESOPHAGOGASTRODUODENOSCOPY N/A 09/25/2012   BZJ:IRCVELFY atonic baggy esophagus status post Maloney dilation 35 F. Hiatal hernia  . GIVENS CAPSULE STUDY N/A 01/15/2013   NORMAL.   . IR GENERIC HISTORICAL  03/17/2016   IR RADIOLOGIST EVAL & MGMT 03/17/2016 [facility]-INTERV RAD  . LESION REMOVAL N/A 05/31/2015   Procedure: REMOVAL RIGHT AND LEFT LESIONS OF MANDIBLE;  Surgeon: Diona Browner, DDS;  Location: [facility];  Service: Oral Surgery;  Laterality: N/A;  . MALONEY DILATION  12/29/2010   RMR;  . NM MYOCAR PERF WALL MOTION  2006   "relavtiely normal" persantine, mild anterior thinning (breast attenuation artifact), no region of scar/ischemia  .  OVARIAN CYST REMOVAL    . RECTOCELE REPAIR N/A 06/29/2015   Procedure: POSTERIOR REPAIR (RECTOCELE);  Surgeon: Jonnie Kind, MD;  Location: [facility];  Service: Gynecology;  Laterality: N/A;  . REDUCTION MAMMAPLASTY Bilateral   . SPINE SURGERY  09/29/2010   Dr. Rolena Infante  . surgical excision of 3 tumors from right thigh and right buttock  and left upper thigh  2010  . TOOTH EXTRACTION Bilateral 12/14/2014   Procedure: REMOVAL OF BILATERAL MANDIBULAR EXOSTOSES;  Surgeon: Diona Browner, DDS;  Location: [facility];  Service: Oral Surgery;  Laterality: Bilateral;  . TRANSTHORACIC ECHOCARDIOGRAM  2010   EF 60-65%, mild conc LVH, grade 1 diastolic dysfunction; mildly calcified MV annulus with mildly thickened leaflets, mildly calcified MR annulus    Family Psychiatric History: see below  Family History:  Family History  Problem Relation Age of Onset  . Heart attack Mother        HTN  . Pneumonia Father   . Kidney failure Father   . Diabetes Father   . Pancreatic cancer Sister   . Diabetes Brother   . Hypertension Brother   . Diabetes Brother   . Cancer Sister        breast   . Hypertension Son   . Sleep apnea Son   . Cancer Sister        pancreatic  . Stroke Maternal Grandmother   . Heart attack Maternal Grandfather   .  Alcohol abuse Maternal Uncle   . Colon cancer Neg Hx   . Anesthesia problems Neg Hx   . Hypotension Neg Hx   . Malignant hyperthermia Neg Hx   . Pseudochol deficiency Neg Hx   . Breast cancer Neg Hx     Social History:  Social History   Socioeconomic History  . Marital status: Divorced    Spouse name: Not on file  . Number of children: 1  . Years of education: 26  . Highest education level: High school graduate  Occupational History  . Occupation: Disabled  Tobacco Use  . Smoking status: Current Every Day Smoker    Packs/day: 0.50    Years: 30.00    Pack years: 15.00    Types: Cigarettes  . Smokeless tobacco: Never Used  . Tobacco comment: 5-10 cigs a day  Vaping Use  . Vaping Use: Never used  Substance and Sexual Activity  . Alcohol use: No    Alcohol/week: 0.0 standard drinks    Comment:    . Drug use: No  . Sexual activity: Not Currently  Other Topics Concern  . Not on file  Social History Narrative   01/29/18 Lives alone, has 3 aides, Mon- Fri 8 hrs, 2 hrs on Sat-Sun, RN manages her meds   Caffeine use: Drink coffee sometimes    Right handed    Social Determinants of Health   Financial Resource Strain: Low Risk   . Difficulty of Paying Living Expenses: Not very hard  Food Insecurity: No Food Insecurity  . Worried About Charity fundraiser in the Last Year: Never true  . Ran Out of Food in the Last Year: Never true  Transportation Needs: No Transportation Needs  . Lack of Transportation (Medical): No  . Lack of Transportation (Non-Medical): No  Physical Activity: Inactive  . Days of Exercise per Week: 0 days  . Minutes of Exercise per Session: 0 min  Stress: Stress Concern Present  . Feeling of Stress : To some extent  Social Connections: Moderately Isolated  .  Frequency of Communication with Friends and Family: More than three times a week  . Frequency of Social Gatherings with Friends and Family: More than three times a week  . Attends Religious  Services: More than 4 times per year  . Active Member of Clubs or Organizations: No  . Attends Archivist Meetings: Never  . Marital Status: Divorced    Allergies:  Allergies  Allergen Reactions  . Cephalexin Hives  . Iron Nausea And Vomiting  . Milk-Related Compounds Other (See Comments)    Doesn't agree with stomach.   . Penicillins Hives    Has patient had a PCN reaction causing immediate rash, facial/tongue/throat swelling, SOB or lightheadedness with hypotension: Yes Has patient had a PCN reaction causing severe rash involving mucus membranes or skin necrosis: No Has patient had a PCN reaction that required hospitalization No Has patient had a PCN reaction occurring within the last 10 years: No If all of the above answers are "NO", then may proceed with Cephalosporin use.   [name] Phenazopyridine Hcl Hives          Metabolic Disorder Labs: Lab Results  Component Value Date   HGBA1C 5.3 03/25/2020   MPG 108 09/30/2018   MPG 134 10/15/2016   No results found for: PROLACTIN Lab Results  Component Value Date   CHOL 125 09/23/2019   TRIG 117 09/23/2019   HDL 45 09/23/2019   CHOLHDL 3.5 09/30/2018   VLDL 26 07/19/2015   LDLCALC 59 09/23/2019   LDLCALC 106 (H) 02/12/2019   Lab Results  Component Value Date   TSH 0.631 12/11/2019   TSH 0.94 02/17/2019    Therapeutic Level Labs: Lab Results  Component Value Date   LITHIUM <0.06 (L) 10/15/2016   Lab Results  Component Value Date   VALPROATE <10.0 (L) 08/23/2007   No components found for:  CBMZ  Current Medications: Current Outpatient Medications  Medication Sig Dispense Refill  . LORazepam (ATIVAN) 0.5 MG tablet Take 1 tablet (0.5 mg total) by mouth 2 (two) times daily. 180 tablet 0  . albuterol (VENTOLIN HFA) 108 (90 Base) MCG/ACT inhaler INHALE 1 TO 2 PUFFS EVERY 6 HOURS AS NEEDED FOR WHEEZING, SHORTNESS OF BREATH 1 each 3  . Alcohol Swabs (B-D SINGLE USE SWABS REGULAR) PADS USE TO CLEAN FINGER PRIOR  TO STICKING FOR BLOOD SUGAR. 100 each 11  . alendronate (FOSAMAX) 70 MG tablet TAKE 1 TABLET (70 MG TOTAL) BY MOUTH EVERY 7 (SEVEN) DAYS. TAKE WITH A FULL GLASS OF WATER ON AN EMPTY STOMACH. 12 tablet 3  . amLODipine (NORVASC) 10 MG tablet TAKE 1 TABLET EVERY DAY 90 tablet 3  . ascorbic acid (VITAMIN C) 500 MG tablet Take 500 mg by mouth daily.    [name] aspirin EC 81 MG tablet Take 1 tablet (81 mg total) by mouth daily with breakfast. 120 tablet 2  . benzonatate (TESSALON) 100 MG capsule Take 1 capsule (100 mg total) by mouth 2 (two) times daily as needed for cough. 20 capsule 0  . betamethasone dipropionate 0.05 % cream Apply topically 2 (two) times daily as needed (Rash). 45 g 3  . Blood Glucose Calibration (ACCU-CHEK GUIDE CONTROL) LIQD USE PRN TO CALIBRATE GLUCOMETER 3 each 11  . blood glucose meter kit and supplies Dispense based on patient and insurance preference. Use up to four times daily as directed. (FOR ICD-10 E10.9, E11.9). 1 each 0  . buPROPion (WELLBUTRIN XL) 150 MG 24 hr tablet Take 1 tablet (150 mg total) by  mouth every morning. 90 tablet 2  . cetirizine (ZYRTEC) 10 MG tablet Take 1 tablet (10 mg total) by mouth daily. 7 tablet 0  . clobetasol cream (TEMOVATE) 7.06 % Apply 1 application topically 2 (two) times daily.     . Continuous Blood Gluc Sensor (FREESTYLE LIBRE 14 DAY SENSOR) MISC 1 each by Does not apply route every 14 (fourteen) days. Change every 2 weeks 2 each 11  . cromolyn (NASALCROM) 5.2 MG/ACT nasal spray Place 1 spray into both nostrils 4 (four) times daily. 13 mL 1  . diclofenac Sodium (VOLTAREN) 1 % GEL APPLY 2 GRAMS  TOPICALLY 4 (FOUR) TIMES DAILY. 800 g 0  . dicyclomine (BENTYL) 10 MG capsule Take 1 capsule (10 mg total) by mouth 3 (three) times daily as needed for spasms. 90 capsule 2  . ezetimibe (ZETIA) 10 MG tablet TAKE 1 TABLET EVERY DAY 90 tablet 1  . glucose blood (ACCU-CHEK GUIDE) test strip USE TO CHECK BLOOD SUGAR FOUR TIMES A DAY AND PRN 400 each 11  .  insulin aspart (NOVOLOG FLEXPEN) 100 UNIT/ML FlexPen Inject 16 to 18 units under skin before meals up to 3 times a day 45 mL 3  . Insulin Pen Needle (SURE COMFORT PEN NEEDLES) 31G X 8 MM MISC USE TO for injecting insulin 4 TIMES DAILY. 200 each 3  . lamoTRIgine (LAMICTAL) 100 MG tablet Take 1 tablet (100 mg total) by mouth 2 (two) times daily. 180 tablet 2  . Lancets (ACCU-CHEK SOFT TOUCH) lancets Use as instructed to check blood sugar 3 times a day. 100 each 12  . levothyroxine (SYNTHROID) 50 MCG tablet TAKE 1 TABLET DAILY, EXCEPT TAKE 1/2 TABLET ON SUNDAY 85 tablet 1  . lidocaine (XYLOCAINE) 2 % solution RINSE WITH 15MLS AND SPIT OUT AFTER 30 SECONDS FOUR TIMES DAILY AS NEEDED FOR PAIN.    [name] losartan (COZAAR) 50 MG tablet TAKE 1 TABLET EVERY DAY 90 tablet 1  . metoprolol tartrate (LOPRESSOR) 50 MG tablet TAKE 1 TABLET TWICE DAILY (NEED MD APPOINTMENT) 180 tablet 3  . montelukast (SINGULAIR) 10 MG tablet TAKE 1 TABLET EVERY DAY 90 tablet 1  . nicotine polacrilex (COMMIT) 4 MG lozenge Take 1 lozenge (4 mg total) by mouth as needed for smoking cessation. 100 tablet 0  . nystatin (MYCOSTATIN/NYSTOP) powder APPLY TO AFFECTED AREA 4 TIMES DAILY. 90 g 1  . Omega-3 Fatty Acids (FISH OIL PO) Take 1 capsule by mouth daily.    [name] oxyCODONE-acetaminophen (PERCOCET) 10-325 MG tablet Take 1 tablet by mouth every 8 (eight) hours as needed.     [name] Plecanatide (TRULANCE) 3 MG TABS Take 3 mg by mouth daily. 30 tablet 3  . pregabalin (LYRICA) 75 MG capsule Take 1 capsule (75 mg total) by mouth daily.    . RABEprazole (ACIPHEX) 20 MG tablet TAKE 1 TABLET TWICE DAILY (NEED MD APPOINTMENT) 180 tablet 0  . risperiDONE (RISPERDAL) 0.5 MG tablet Take 1 tablet (0.5 mg total) by mouth at bedtime. 90 tablet 2  . rosuvastatin (CRESTOR) 5 MG tablet TAKE 1 TABLET AT BEDTIME 90 tablet 2  . sertraline (ZOLOFT) 100 MG tablet Take 1 tablet (100 mg total) by mouth daily. 90 tablet 2  . STIOLTO RESPIMAT 2.5-2.5 MCG/ACT AERS INHALE 2  PUFFS INTO THE LUNGS DAILY. 12 g 3  . TOUJEO MAX SOLOSTAR 300 UNIT/ML Solostar Pen INJECT 24 UNITS SUBCUTANEOUSLY INTO THE SKIN AT BEDTIME 12 mL 3  . traZODone (DESYREL) 100 MG tablet Take 1 tablet (100 mg  total) by mouth at bedtime. 90 tablet 2  . UNABLE TO [facility] bed mattress x 1  DX: G47.33, J44.9 1 each 0  . UNABLE TO FIND Standard wheelchair Dx M47.16, R29.898 1 each 0   Current Facility-Administered Medications  Medication Dose Route Frequency Provider Last Rate Last Admin  . meclizine (ANTIVERT) tablet 25 mg  25 mg Oral TID PRN Noreene Larsson, NP         Musculoskeletal: Strength & Muscle Tone: decreased Gait & Station: unsteady Patient leans: N/A  Psychiatric Specialty Exam: Review of Systems  Musculoskeletal: Positive for back pain and myalgias.  Psychiatric/Behavioral: Positive for sleep disturbance.  All other systems reviewed and are negative.   There were no vitals taken for this visit.There is no height or weight on file to calculate BMI.  General Appearance: NA  Eye Contact:  NA  Speech:  Clear and Coherent  Volume:  Normal  Mood:  Irritable  Affect:  NA  Thought Process:  Goal Directed  Orientation:  Full (Time, Place, and Person)  Thought Content: Rumination   Suicidal Thoughts:  No  Homicidal Thoughts:  No  Memory:  Immediate;   Good Recent;   Fair Remote;   Fair  Judgement:  Poor  Insight:  Shallow  Psychomotor Activity:  Decreased  Concentration:  Concentration: Fair and Attention Span: Fair  Recall:  Good  Fund of Knowledge: Good  Language: Good  Akathisia:  No  Handed:  Right  AIMS (if indicated): not done  Assets:  Communication Skills Desire for Improvement Resilience Social Support Talents/Skills  ADL's:  Intact  Cognition: WNL  Sleep:  Fair   Screenings: Mini-Mental   Flowsheet Row Office Visit from 02/03/2019 in [facility] Office Visit from 09/08/2015 in [facility] [place] Visit from 07/02/2014  in [facility] [place] Visit from 12/08/2013 in [facility] [place]  Total Score (max 30 points ) _0 PHQ2-9   Flowsheet Row Video Visit from 03/09/2020 in [facility] Video Visit from 02/19/2020 in [facility] Office Visit from 02/09/2020 in [facility] Visit from 12/11/2019 in [facility] Visit from 10/09/2019 in [facility]  PHQ-2 Total Score 0 0 1 0 2  PHQ-9 Total Score - - - - 4       Assessment and Plan: This patient is a 70 year old female with a history of depression and anxiety.  She had a fall recently that may have been secondary to polypharmacy.  Therefore I am going to decrease Ativan 0.5 mg to twice a day only.  For now she will continue Wellbutrin XL 150 mg daily for depression and smoking cessation, Zoloft 100 mg daily for depression, trazodone 100 mg at bedtime for sleep and Lamictal 100 mg twice daily for mood stabilization.  We will continue to look at ways we can reduce polypharmacy in the future.  She will return to see me in 3 months   Levonne Spiller, MD 04/19/2020, 4:01 PM

## 2020-04-19 NOTE — Telephone Encounter (Signed)
Patient is calling to say that she is itching and would like something called.  Patient states that she does not have a visible rash.  Patient uses [facility] in [place], [place].

## 2020-04-19 NOTE — Telephone Encounter (Signed)
Please contact patient she has been itching.

## 2020-04-20 ENCOUNTER — Telehealth (INDEPENDENT_AMBULATORY_CARE_PROVIDER_SITE_OTHER): Payer: Medicare HMO | Admitting: Family Medicine

## 2020-04-20 ENCOUNTER — Ambulatory Visit (INDEPENDENT_AMBULATORY_CARE_PROVIDER_SITE_OTHER): Payer: Medicare HMO | Admitting: Psychiatry

## 2020-04-20 ENCOUNTER — Encounter: Payer: Self-pay | Admitting: Family Medicine

## 2020-04-20 ENCOUNTER — Other Ambulatory Visit: Payer: Self-pay

## 2020-04-20 VITALS — Ht 59.0 in | Wt 170.0 lb

## 2020-04-20 DIAGNOSIS — E1165 Type 2 diabetes mellitus with hyperglycemia: Secondary | ICD-10-CM | POA: Diagnosis not present

## 2020-04-20 DIAGNOSIS — R2681 Unsteadiness on feet: Secondary | ICD-10-CM | POA: Diagnosis not present

## 2020-04-20 DIAGNOSIS — J449 Chronic obstructive pulmonary disease, unspecified: Secondary | ICD-10-CM | POA: Diagnosis not present

## 2020-04-20 DIAGNOSIS — M160 Bilateral primary osteoarthritis of hip: Secondary | ICD-10-CM | POA: Diagnosis not present

## 2020-04-20 DIAGNOSIS — J42 Unspecified chronic bronchitis: Secondary | ICD-10-CM | POA: Diagnosis not present

## 2020-04-20 DIAGNOSIS — F331 Major depressive disorder, recurrent, moderate: Secondary | ICD-10-CM | POA: Diagnosis not present

## 2020-04-20 DIAGNOSIS — E1142 Type 2 diabetes mellitus with diabetic polyneuropathy: Secondary | ICD-10-CM | POA: Diagnosis not present

## 2020-04-20 DIAGNOSIS — F250 Schizoaffective disorder, bipolar type: Secondary | ICD-10-CM | POA: Diagnosis not present

## 2020-04-20 DIAGNOSIS — F17210 Nicotine dependence, cigarettes, uncomplicated: Secondary | ICD-10-CM | POA: Diagnosis not present

## 2020-04-20 DIAGNOSIS — L299 Pruritus, unspecified: Secondary | ICD-10-CM

## 2020-04-20 DIAGNOSIS — E039 Hypothyroidism, unspecified: Secondary | ICD-10-CM | POA: Diagnosis not present

## 2020-04-20 MED ORDER — TRIAMCINOLONE ACETONIDE 0.1 % EX CREA: 1.0000 "application " | TOPICAL_CREAM | Freq: Every day | CUTANEOUS | 1 refills | Status: DC

## 2020-04-20 NOTE — Telephone Encounter (Signed)
Please inform [name] that in the absence of visible rash, there is no specific medicine to target itching.  If she has not had a general physical with laboratory done the last 6 months, I would encourage this.  If [employer] does not provide relief, she can have available at her pharmacy a 16 ounce jar of generic 0.1% triamcinolone to be used daily on itchy areas after bathing except for face and body folds.  1 refill.

## 2020-04-20 NOTE — Telephone Encounter (Signed)
Informed patient per Dr Delma Officer advise- patient states she has had labs done within 6 months.  She states that she would like to try the triamcinolone cream and if no better she will call back to schedule appointment. Per Dr.Tafeen Triamcinolone sent to patient pharmacy.

## 2020-04-20 NOTE — Progress Notes (Signed)
Virtual Visit via Telephone Note  I connected with [name] on 04/20/20 at 4:15 PM EST  by telephone and verified that I am speaking with the correct person using two identifiers.  Location: Patient: Home Provider: [place] office    I discussed the limitations, risks, security and privacy concerns of performing an evaluation and management service by telephone and the availability of in person appointments. I also discussed with the patient that there may be a patient responsible charge related to this service. The patient expressed understanding and agreed to proceed.  I provided 40 minutes of non-face-to-face time during this encounter.   [name] Smoker, LCSW               THERAPIST PROGRESS NOTE    Session Time:  Tuesday 04/20/2020 4:15 PM  - 4:55 PM         Participation Level: Active  Behavioral Response:  Less depressed, anxious, talkative, alert   Type of Therapy: Individual Therapy  Treatment Goals addressed: Patient wants to learn how to improve coping skills to manage chronic pain and health issues/ improve mood    Interventions: CBT and Supportive   Summary: [name] is a 70 y.o. female who presents with  long standing history of recurrent periods  of depression beginning when she was [age] and her favorite uncle died. Patient reports multiple psychiatric hospitlaizations due to depression and suicidal ideations with the last one occuring in 1997. Patient has participated in outpatient psychotherapy and medication management intermittently since age 60. She currently is seeing psychiatrist Dr. Harrington Challenger . Prior to this, she was seen at [facility]. Patient also has had ECT at [facility]. Symptoms have worsened in recent months due to family stress and have  included depressed mood, anxiety, excessive worry, and tearfulness.         Patient's last contact was by virtual visit via telephone about 4 weeks ago.  She reports minimal symptoms of depression but  increased stress and anxiety.  She expresses frustration her Ativan was decreased.  She also continues to worry that medical providers think she abuses her medication.  She is pleased with recent conversation with her pain management doctor and said this went well.  She reports feeling supported by her doctor.  She continues to use distracting activities including reading, watching television, and talking to others to try to cope with pain.  She also continues to use heating pad, cold compress, and pain creams as well as participating physical therapy at home.  She expresses frustration about still having pain but is pleased with her efforts to manage.  She reports seeing another pain management specialist at [facility] as recommended by her current pain management doctor.  However, she reports deciding against seeing him again as she did not feel comfortable with his recommendations.      Suicidal/Homicidal: Nowithout intent/plan  Therapist Response: Reviewed symptoms, praised and reinforced patient's efforts to use assertiveness skills to express concerns to her pain management doctor, discussed effects, praised and reinforced patient's efforts to manage pain, discussed stressors, facilitated expression of thoughts and feelings, validated feelings, discussed connection between anxiety/stress/pain, assisted patient identify way to trigger relaxation response, developed plan with patient to practice imagery daily  Plan: Return again in 2 weeks.  Diagnosis: Axis I: MDD, Recurrent, moderate    Axis II: Deferred    Cindee Mclester E Avyukth Bontempo, LCSW

## 2020-04-20 NOTE — Addendum Note (Signed)
Addended by: Zenia Resides on: 04/20/2020 01:24 PM   Modules accepted: Orders

## 2020-04-21 ENCOUNTER — Telehealth: Payer: Self-pay

## 2020-04-21 ENCOUNTER — Other Ambulatory Visit: Payer: Self-pay

## 2020-04-21 NOTE — Telephone Encounter (Signed)
Re-faxed this and marked it urgent. Pt informed to call us if she hasn't heard anything in a few days.

## 2020-04-21 NOTE — Telephone Encounter (Signed)
Patient calling she states she reached out to [facility] [employer] in regards to her wheelchair and they havent received anything can we resend or follow up on this for her p# [phone]

## 2020-04-21 NOTE — Patient Outreach (Addendum)
[facility]) Care Management  04/21/2020  Dhiya Smits Rattan 08-Apr-1950 [id number]   Telephone call to patient for disease management follow up.   No answer.  Unable to leave a voice message.  Plan: If no return call, RN CM will attempt patient again in the month of May.  Jone Baseman, RN, MSN [facility] Care Management Coordinator Direct Line [phone] Cell [phone] Toll Free: [phone]  Fax: [phone]

## 2020-04-22 ENCOUNTER — Ambulatory Visit
Admission: RE | Admit: 2020-04-22 | Discharge: 2020-04-22 | Disposition: A | Discharge status: Home / Self Care | Payer: Medicare HMO | Source: Ambulatory Visit | Attending: Radiation Oncology | Admitting: Radiation Oncology

## 2020-04-22 ENCOUNTER — Telehealth: Payer: Medicare HMO | Admitting: Family Medicine

## 2020-04-22 ENCOUNTER — Other Ambulatory Visit: Payer: Self-pay

## 2020-04-22 DIAGNOSIS — D496 Neoplasm of unspecified behavior of brain: Secondary | ICD-10-CM | POA: Diagnosis not present

## 2020-04-22 DIAGNOSIS — Z9889 Other specified postprocedural states: Secondary | ICD-10-CM | POA: Diagnosis not present

## 2020-04-22 DIAGNOSIS — C719 Malignant neoplasm of brain, unspecified: Secondary | ICD-10-CM | POA: Diagnosis not present

## 2020-04-22 DIAGNOSIS — G9389 Other specified disorders of brain: Secondary | ICD-10-CM | POA: Diagnosis not present

## 2020-04-22 DIAGNOSIS — D329 Benign neoplasm of meninges, unspecified: Secondary | ICD-10-CM

## 2020-04-22 MED ORDER — GADOBENATE DIMEGLUMINE 529 MG/ML IV SOLN
16.0000 mL | Freq: Once | INTRAVENOUS | Status: AC | PRN
  Administered 2020-04-22: 16 mL via INTRAVENOUS

## 2020-04-23 DIAGNOSIS — F250 Schizoaffective disorder, bipolar type: Secondary | ICD-10-CM | POA: Diagnosis not present

## 2020-04-23 DIAGNOSIS — M160 Bilateral primary osteoarthritis of hip: Secondary | ICD-10-CM | POA: Diagnosis not present

## 2020-04-23 DIAGNOSIS — E039 Hypothyroidism, unspecified: Secondary | ICD-10-CM | POA: Diagnosis not present

## 2020-04-23 DIAGNOSIS — E1142 Type 2 diabetes mellitus with diabetic polyneuropathy: Secondary | ICD-10-CM | POA: Diagnosis not present

## 2020-04-23 DIAGNOSIS — J449 Chronic obstructive pulmonary disease, unspecified: Secondary | ICD-10-CM | POA: Diagnosis not present

## 2020-04-23 DIAGNOSIS — F17210 Nicotine dependence, cigarettes, uncomplicated: Secondary | ICD-10-CM | POA: Diagnosis not present

## 2020-04-23 DIAGNOSIS — E1165 Type 2 diabetes mellitus with hyperglycemia: Secondary | ICD-10-CM | POA: Diagnosis not present

## 2020-04-23 DIAGNOSIS — J42 Unspecified chronic bronchitis: Secondary | ICD-10-CM | POA: Diagnosis not present

## 2020-04-23 DIAGNOSIS — R2681 Unsteadiness on feet: Secondary | ICD-10-CM | POA: Diagnosis not present

## 2020-04-24 NOTE — Progress Notes (Signed)
Unable to connect with pt despite multiple attempts, in office visit to be scheduled

## 2020-04-26 ENCOUNTER — Encounter: Payer: Self-pay | Admitting: Podiatry

## 2020-04-26 ENCOUNTER — Ambulatory Visit (INDEPENDENT_AMBULATORY_CARE_PROVIDER_SITE_OTHER): Payer: Medicare HMO | Admitting: Podiatry

## 2020-04-26 ENCOUNTER — Other Ambulatory Visit: Payer: Self-pay

## 2020-04-26 ENCOUNTER — Telehealth: Payer: Self-pay

## 2020-04-26 ENCOUNTER — Inpatient Hospital Stay: Payer: Medicare HMO | Attending: Internal Medicine

## 2020-04-26 ENCOUNTER — Telehealth: Payer: Self-pay | Admitting: Radiation Oncology

## 2020-04-26 DIAGNOSIS — M160 Bilateral primary osteoarthritis of hip: Secondary | ICD-10-CM

## 2020-04-26 DIAGNOSIS — E039 Hypothyroidism, unspecified: Secondary | ICD-10-CM | POA: Diagnosis not present

## 2020-04-26 DIAGNOSIS — B351 Tinea unguium: Secondary | ICD-10-CM

## 2020-04-26 DIAGNOSIS — D649 Anemia, unspecified: Secondary | ICD-10-CM | POA: Insufficient documentation

## 2020-04-26 DIAGNOSIS — E785 Hyperlipidemia, unspecified: Secondary | ICD-10-CM | POA: Insufficient documentation

## 2020-04-26 DIAGNOSIS — Z79899 Other long term (current) drug therapy: Secondary | ICD-10-CM

## 2020-04-26 DIAGNOSIS — J449 Chronic obstructive pulmonary disease, unspecified: Secondary | ICD-10-CM | POA: Insufficient documentation

## 2020-04-26 DIAGNOSIS — F250 Schizoaffective disorder, bipolar type: Secondary | ICD-10-CM | POA: Diagnosis not present

## 2020-04-26 DIAGNOSIS — F319 Bipolar disorder, unspecified: Secondary | ICD-10-CM | POA: Insufficient documentation

## 2020-04-26 DIAGNOSIS — R29898 Other symptoms and signs involving the musculoskeletal system: Secondary | ICD-10-CM

## 2020-04-26 DIAGNOSIS — R2681 Unsteadiness on feet: Secondary | ICD-10-CM | POA: Diagnosis not present

## 2020-04-26 DIAGNOSIS — G4730 Sleep apnea, unspecified: Secondary | ICD-10-CM | POA: Insufficient documentation

## 2020-04-26 DIAGNOSIS — M79676 Pain in unspecified toe(s): Secondary | ICD-10-CM

## 2020-04-26 DIAGNOSIS — Z86010 Personal history of colonic polyps: Secondary | ICD-10-CM | POA: Insufficient documentation

## 2020-04-26 DIAGNOSIS — M797 Fibromyalgia: Secondary | ICD-10-CM | POA: Insufficient documentation

## 2020-04-26 DIAGNOSIS — K5900 Constipation, unspecified: Secondary | ICD-10-CM | POA: Insufficient documentation

## 2020-04-26 DIAGNOSIS — D320 Benign neoplasm of cerebral meninges: Secondary | ICD-10-CM | POA: Insufficient documentation

## 2020-04-26 DIAGNOSIS — Q828 Other specified congenital malformations of skin: Secondary | ICD-10-CM | POA: Diagnosis not present

## 2020-04-26 DIAGNOSIS — J42 Unspecified chronic bronchitis: Secondary | ICD-10-CM | POA: Diagnosis not present

## 2020-04-26 DIAGNOSIS — G40909 Epilepsy, unspecified, not intractable, without status epilepticus: Secondary | ICD-10-CM | POA: Insufficient documentation

## 2020-04-26 DIAGNOSIS — M2012 Hallux valgus (acquired), left foot: Secondary | ICD-10-CM

## 2020-04-26 DIAGNOSIS — K449 Diaphragmatic hernia without obstruction or gangrene: Secondary | ICD-10-CM | POA: Insufficient documentation

## 2020-04-26 DIAGNOSIS — I10 Essential (primary) hypertension: Secondary | ICD-10-CM | POA: Insufficient documentation

## 2020-04-26 DIAGNOSIS — F17210 Nicotine dependence, cigarettes, uncomplicated: Secondary | ICD-10-CM | POA: Insufficient documentation

## 2020-04-26 DIAGNOSIS — G4700 Insomnia, unspecified: Secondary | ICD-10-CM | POA: Insufficient documentation

## 2020-04-26 DIAGNOSIS — E1142 Type 2 diabetes mellitus with diabetic polyneuropathy: Secondary | ICD-10-CM | POA: Diagnosis not present

## 2020-04-26 DIAGNOSIS — M2011 Hallux valgus (acquired), right foot: Secondary | ICD-10-CM

## 2020-04-26 DIAGNOSIS — E1165 Type 2 diabetes mellitus with hyperglycemia: Secondary | ICD-10-CM | POA: Diagnosis not present

## 2020-04-26 DIAGNOSIS — M810 Age-related osteoporosis without current pathological fracture: Secondary | ICD-10-CM | POA: Insufficient documentation

## 2020-04-26 DIAGNOSIS — Z8673 Personal history of transient ischemic attack (TIA), and cerebral infarction without residual deficits: Secondary | ICD-10-CM | POA: Insufficient documentation

## 2020-04-26 DIAGNOSIS — E119 Type 2 diabetes mellitus without complications: Secondary | ICD-10-CM | POA: Insufficient documentation

## 2020-04-26 NOTE — Telephone Encounter (Signed)
Pls let pt know and refer to OT for evaluation for wheelchair , I will sign

## 2020-04-26 NOTE — Telephone Encounter (Signed)
We have faxed this twice with confirmations both times. Left message for pt to see if there is another fax number we can send this to.

## 2020-04-26 NOTE — Telephone Encounter (Signed)
Patient calling back about her order for a wheelchair to [employer] she called this morning and they told her they still havent received anything can we check on this ?

## 2020-04-26 NOTE — Telephone Encounter (Signed)
Cannot get wheelchair with the notes in the [place]. WIll need wheelchair eval with OT. Her insurance is really strict

## 2020-04-27 ENCOUNTER — Encounter: Payer: Self-pay | Admitting: Internal Medicine

## 2020-04-27 ENCOUNTER — Inpatient Hospital Stay (HOSPITAL_BASED_OUTPATIENT_CLINIC_OR_DEPARTMENT_OTHER): Payer: Medicare HMO | Admitting: Internal Medicine

## 2020-04-27 ENCOUNTER — Other Ambulatory Visit: Payer: Self-pay

## 2020-04-27 VITALS — BP 169/59 | HR 73 | Temp 97.2°F | Resp 18 | Ht 59.0 in | Wt 170.0 lb

## 2020-04-27 DIAGNOSIS — K5900 Constipation, unspecified: Secondary | ICD-10-CM | POA: Diagnosis not present

## 2020-04-27 DIAGNOSIS — G4730 Sleep apnea, unspecified: Secondary | ICD-10-CM | POA: Diagnosis not present

## 2020-04-27 DIAGNOSIS — Z8673 Personal history of transient ischemic attack (TIA), and cerebral infarction without residual deficits: Secondary | ICD-10-CM | POA: Diagnosis not present

## 2020-04-27 DIAGNOSIS — I10 Essential (primary) hypertension: Secondary | ICD-10-CM | POA: Diagnosis not present

## 2020-04-27 DIAGNOSIS — E785 Hyperlipidemia, unspecified: Secondary | ICD-10-CM | POA: Diagnosis not present

## 2020-04-27 DIAGNOSIS — M810 Age-related osteoporosis without current pathological fracture: Secondary | ICD-10-CM | POA: Diagnosis not present

## 2020-04-27 DIAGNOSIS — F319 Bipolar disorder, unspecified: Secondary | ICD-10-CM | POA: Diagnosis not present

## 2020-04-27 DIAGNOSIS — E039 Hypothyroidism, unspecified: Secondary | ICD-10-CM | POA: Diagnosis not present

## 2020-04-27 DIAGNOSIS — K449 Diaphragmatic hernia without obstruction or gangrene: Secondary | ICD-10-CM | POA: Diagnosis not present

## 2020-04-27 DIAGNOSIS — D329 Benign neoplasm of meninges, unspecified: Secondary | ICD-10-CM

## 2020-04-27 DIAGNOSIS — D320 Benign neoplasm of cerebral meninges: Secondary | ICD-10-CM | POA: Diagnosis not present

## 2020-04-27 DIAGNOSIS — J449 Chronic obstructive pulmonary disease, unspecified: Secondary | ICD-10-CM | POA: Diagnosis not present

## 2020-04-27 DIAGNOSIS — G4700 Insomnia, unspecified: Secondary | ICD-10-CM | POA: Diagnosis not present

## 2020-04-27 DIAGNOSIS — Z86010 Personal history of colonic polyps: Secondary | ICD-10-CM | POA: Diagnosis not present

## 2020-04-27 DIAGNOSIS — M797 Fibromyalgia: Secondary | ICD-10-CM | POA: Diagnosis not present

## 2020-04-27 DIAGNOSIS — F17210 Nicotine dependence, cigarettes, uncomplicated: Secondary | ICD-10-CM | POA: Diagnosis not present

## 2020-04-27 DIAGNOSIS — D649 Anemia, unspecified: Secondary | ICD-10-CM | POA: Diagnosis not present

## 2020-04-27 DIAGNOSIS — G40909 Epilepsy, unspecified, not intractable, without status epilepticus: Secondary | ICD-10-CM | POA: Diagnosis not present

## 2020-04-27 DIAGNOSIS — E119 Type 2 diabetes mellitus without complications: Secondary | ICD-10-CM | POA: Diagnosis not present

## 2020-04-27 NOTE — Telephone Encounter (Signed)
Left  msg that she was referred and that they would call her for appt

## 2020-04-27 NOTE — Progress Notes (Signed)
[facility] [facility] [place], [place] [address] [phone]   New Patient Evaluation  Date of Service: 04/27/20 Patient Name: [name] Patient MRN: [id number] Patient DOB: 1950-12-14 Provider: Ventura Sellers, MD  Identifying Statement:  [name] is a 70 y.o. female with right frontal meningioma who presents for initial consultation and evaluation.    Referring Provider: Kyung Rudd, MD [place] [address] [place],  [place] [address]  Oncologic History: 11/23/11: Craniotomy, resection orbital groove meningioma Sherwood Gambler) 10/17/18: SRS for localized recurrent disease Lisbeth Renshaw)  History of Present Illness: The patient's records from the referring physician were obtained and reviewed and the patient interviewed to confirm this HPI.  [name] presents today for follow up after recent MRI brain.  She describes no new or progressive neurologic deficits.  No focal weakness, language dysfunction or recent seizures.  She continues to dose Lamictal through Dr. Jannifer Franklin.  She continues to describe chronic limb and joint pain requiring frequent use of opiate rescue, managed by pain clinic.  Multiple recent ED visits.  Continues to follow with behavioral health, and works with physical therapy as well.  Ambulates with a walker because of orthopedic dysfunction, chronic pain.   Medications: Current Outpatient Medications on File Prior to Visit  Medication Sig Dispense Refill  . Accu-Chek Softclix Lancets lancets TEST BLOOD SUGAR THREE TIMES DAILY AS DIRECTED 300 each 2  . albuterol (VENTOLIN HFA) 108 (90 Base) MCG/ACT inhaler INHALE 1 TO 2 PUFFS EVERY 6 HOURS AS NEEDED FOR WHEEZING, SHORTNESS OF BREATH 1 each 3  . Alcohol Swabs (B-D SINGLE USE SWABS REGULAR) PADS USE TO CLEAN FINGER PRIOR TO STICKING FOR BLOOD SUGAR. 100 each 11  . alendronate (FOSAMAX) 70 MG tablet TAKE 1 TABLET (70 MG TOTAL) BY MOUTH EVERY 7 (SEVEN) DAYS. TAKE WITH A FULL GLASS OF  WATER ON AN EMPTY STOMACH. 12 tablet 3  . amLODipine (NORVASC) 10 MG tablet TAKE 1 TABLET EVERY DAY 90 tablet 3  . ascorbic acid (VITAMIN C) 500 MG tablet Take 500 mg by mouth daily.    [name] aspirin EC 81 MG tablet Take 1 tablet (81 mg total) by mouth daily with breakfast. 120 tablet 2  . benzonatate (TESSALON) 100 MG capsule Take 1 capsule (100 mg total) by mouth 2 (two) times daily as needed for cough. 20 capsule 0  . betamethasone dipropionate 0.05 % cream Apply topically 2 (two) times daily as needed (Rash). 45 g 3  . Blood Glucose Calibration (ACCU-CHEK GUIDE CONTROL) LIQD USE PRN TO CALIBRATE GLUCOMETER 3 each 11  . blood glucose meter kit and supplies Dispense based on patient and insurance preference. Use up to four times daily as directed. (FOR ICD-10 E10.9, E11.9). 1 each 0  . buPROPion (WELLBUTRIN XL) 150 MG 24 hr tablet Take 1 tablet (150 mg total) by mouth every morning. 90 tablet 2  . cetirizine (ZYRTEC) 10 MG tablet Take 1 tablet (10 mg total) by mouth daily. 7 tablet 0  . clobetasol cream (TEMOVATE) 6.12 % Apply 1 application topically 2 (two) times daily.     . Continuous Blood Gluc Sensor (FREESTYLE LIBRE 14 DAY SENSOR) MISC 1 each by Does not apply route every 14 (fourteen) days. Change every 2 weeks 2 each 11  . cromolyn (NASALCROM) 5.2 MG/ACT nasal spray Place 1 spray into both nostrils 4 (four) times daily. 13 mL 1  . diclofenac Sodium (VOLTAREN) 1 % GEL APPLY 2 GRAMS  TOPICALLY 4 (FOUR) TIMES DAILY. 800 g 0  . dicyclomine (BENTYL) 10 MG capsule Take 1 capsule (10 mg total) by mouth 3 (three) times daily as needed for spasms. 90 capsule 2  . ezetimibe (ZETIA) 10 MG tablet TAKE 1 TABLET EVERY DAY 90 tablet 1  . glucose blood (ACCU-CHEK GUIDE) test strip USE TO CHECK BLOOD SUGAR FOUR TIMES A DAY AND PRN 400 each 11  . insulin aspart (NOVOLOG FLEXPEN) 100 UNIT/ML FlexPen Inject 16 to 18 units under skin before meals up to 3 times a day 45 mL 3  . Insulin Pen Needle (SURE COMFORT PEN  NEEDLES) 31G X 8 MM MISC USE TO for injecting insulin 4 TIMES DAILY. 200 each 3  . lamoTRIgine (LAMICTAL) 100 MG tablet Take 1 tablet (100 mg total) by mouth 2 (two) times daily. 180 tablet 2  . levothyroxine (SYNTHROID) 50 MCG tablet TAKE 1 TABLET DAILY, EXCEPT TAKE 1/2 TABLET ON SUNDAY 85 tablet 1  . lidocaine (XYLOCAINE) 2 % solution RINSE WITH 15MLS AND SPIT OUT AFTER 30 SECONDS FOUR TIMES DAILY AS NEEDED FOR PAIN.    [name] LORazepam (ATIVAN) 0.5 MG tablet Take 1 tablet (0.5 mg total) by mouth 2 (two) times daily. 180 tablet 0  . losartan (COZAAR) 50 MG tablet TAKE 1 TABLET EVERY DAY 90 tablet 1  . metoprolol tartrate (LOPRESSOR) 50 MG tablet TAKE 1 TABLET TWICE DAILY (NEED MD APPOINTMENT) 180 tablet 3  . montelukast (SINGULAIR) 10 MG tablet TAKE 1 TABLET EVERY DAY 90 tablet 1  . nicotine polacrilex (COMMIT) 4 MG lozenge Take 1 lozenge (4 mg total) by mouth as needed for smoking cessation. 100 tablet 0  . nystatin (MYCOSTATIN/NYSTOP) powder APPLY TO AFFECTED AREA 4 TIMES DAILY. 90 g 1  . Omega-3 Fatty Acids (FISH OIL PO) Take 1 capsule by mouth daily.    [name] oxyCODONE (OXY IR/ROXICODONE) 5 MG immediate release tablet Take by mouth.    . oxyCODONE (OXY IR/ROXICODONE) 5 MG immediate release tablet Take 5 mg by mouth every 6 (six) hours as needed.    [name] oxyCODONE-acetaminophen (PERCOCET) 10-325 MG tablet Take 1 tablet by mouth every 8 (eight) hours as needed.     [name] Plecanatide (TRULANCE) 3 MG TABS Take 3 mg by mouth daily. 30 tablet 3  . pregabalin (LYRICA) 75 MG capsule Take 1 capsule (75 mg total) by mouth daily.    . RABEprazole (ACIPHEX) 20 MG tablet TAKE 1 TABLET TWICE DAILY (NEED MD APPOINTMENT) 180 tablet 0  . risperiDONE (RISPERDAL) 0.5 MG tablet Take 1 tablet (0.5 mg total) by mouth at bedtime. 90 tablet 2  . rosuvastatin (CRESTOR) 5 MG tablet TAKE 1 TABLET AT BEDTIME 90 tablet 2  . sertraline (ZOLOFT) 100 MG tablet Take 1 tablet (100 mg total) by mouth daily. 90 tablet 2  . STIOLTO RESPIMAT  2.5-2.5 MCG/ACT AERS INHALE 2 PUFFS INTO THE LUNGS DAILY. 12 g 3  . TOUJEO MAX SOLOSTAR 300 UNIT/ML Solostar Pen INJECT 24 UNITS SUBCUTANEOUSLY INTO THE SKIN AT BEDTIME 12 mL 3  . traZODone (DESYREL) 100 MG tablet Take 1 tablet (100 mg total) by mouth at bedtime. 90 tablet 2  . triamcinolone (KENALOG) 0.1 % Apply 1 application topically daily. 15 g 1  . UNABLE TO [facility] bed mattress x 1  DX: G47.33, J44.9 1 each 0  . UNABLE TO FIND Standard wheelchair Dx M47.16, R29.898 1 each 0   Current Facility-Administered Medications on File Prior to Visit  Medication Dose Route Frequency  Provider Last Rate Last Admin  . meclizine (ANTIVERT) tablet 25 mg  25 mg Oral TID PRN Noreene Larsson, NP        Allergies:  Allergies  Allergen Reactions  . Lac Bovis Rash    Doesn't agree with stomach.   . Phenazopyridine Hcl Hives  . Cephalexin Hives  . Iron Nausea And Vomiting  . Milk-Related Compounds Other (See Comments)    Doesn't agree with stomach.   . Penicillins Hives    Has patient had a PCN reaction causing immediate rash, facial/tongue/throat swelling, SOB or lightheadedness with hypotension: Yes Has patient had a PCN reaction causing severe rash involving mucus membranes or skin necrosis: No Has patient had a PCN reaction that required hospitalization No Has patient had a PCN reaction occurring within the last 10 years: No If all of the above answers are "NO", then may proceed with Cephalosporin use.   [name] Phenazopyridine Hcl Hives         Past Medical History:  Past Medical History:  Diagnosis Date  . Allergy   . Anemia   . Anxiety    takes Ativan daily  . Arthritis   . Assistance needed for mobility   . Bipolar disorder ([place])    takes Risperdal nightly  . Blood transfusion   . Brain tumor ([place])   . Cancer ([place])    In her gum  . Carpal tunnel syndrome of right wrist 05/23/2011  . Cervical disc disorder with radiculopathy of cervical region 10/31/2012  . Chronic back pain    . Chronic idiopathic constipation   . Chronic neck and back pain   . Colon polyps   . COPD (chronic obstructive pulmonary disease) with chronic bronchitis ([place]) 09/16/2013   Office Spirometry 10/30/2013-submaximal effort based on appearance of loop and curve. Numbers would fit with severe restriction but her physiologic capability may be better than this. FVC 0.91/44%, and 10.74/45%, FEV1/FVC 0.81, FEF 25-75% 1.43/69%    . Diabetes mellitus    Type II  . Diverticulosis    TCS 9/08 by Dr. Delfin Edis for diarrhea . Bx for micro scopic colitis negative.   . Fibromyalgia   . Frequent falls   . GERD (gastroesophageal reflux disease)    takes Aciphex daily  . Glaucoma    eye drops daily  . Gum symptoms    infection on antibiotic  . Hemiplegia affecting non-dominant side, post-stroke 08/02/2011  . Hiatal hernia   . Hyperlipidemia    takes Crestor daily  . Hypertension    takes Amlodipine,Metoprolol,and Clonidine daily  . Hypothyroidism    takes Synthroid daily  . IBS (irritable bowel syndrome)   . Insomnia    takes Trazodone nightly  . Major depression, recurrent ([place])    takes Zoloft daily  . 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 Metabolic encephalopathy 11/04/537  . Migraines    chronic headaches  . Mononeuritis lower limb   . Narcolepsy   . Osteoporosis   . Pancreatitis 2006   due to Depakote with normal EUS   . Schatzki's ring    non critical / EGD with ED 8/2011with RMR  . Seizures ([place])    takes Lamictal daily.Last seizure 3 yrs ago  . Sleep apnea    on CPAP  . Small bowel obstruction ([place])   . Stroke [facility])    left sided weakness, speech changes  . Tubular adenoma of colon    Past Surgical History:  Past Surgical History:  Procedure Laterality Date  .  ABDOMINAL HYSTERECTOMY  1978  . BACK SURGERY  July 2012  . BACTERIAL OVERGROWTH TEST N/A 05/05/2013   Procedure: BACTERIAL OVERGROWTH TEST;  Surgeon: Daneil Dolin, MD;  [facility];  Service: Endoscopy;   Laterality: N/A;  7:30  . BIOPSY THYROID  2009  . BRAIN SURGERY  11/2011   resection of meningioma  . BREAST REDUCTION SURGERY  1994  . CARDIAC CATHETERIZATION  05/10/2005   normal coronaries, normal LV systolic function and EF (Dr. Jackie Plum)  . CARPAL TUNNEL RELEASE Left 07/22/04   Dr. Aline Brochure  . CATARACT EXTRACTION Bilateral   . CHOLECYSTECTOMY  1984  . COLONOSCOPY N/A 09/25/2012   SWN:IOEVOJJ diverticulosis.  colonic polyp-removed : tubular adenoma  . CRANIOTOMY  11/23/2011   Procedure: CRANIOTOMY TUMOR EXCISION;  Surgeon: Hosie Spangle, MD;  Location: [facility]O ORS;  Service: Neurosurgery;  Laterality: N/A;  Craniotomy for tumor resection  . ESOPHAGOGASTRODUODENOSCOPY  12/29/2010   Rourk-Retained food in the esophagus and stomach, small hiatal hernia, status post Maloney dilation of the esophagus  . ESOPHAGOGASTRODUODENOSCOPY N/A 09/25/2012   KKX:FGHWEXHB atonic baggy esophagus status post Maloney dilation 48 F. Hiatal hernia  . GIVENS CAPSULE STUDY N/A 01/15/2013   NORMAL.   . IR GENERIC HISTORICAL  03/17/2016   IR RADIOLOGIST EVAL & MGMT 03/17/2016 [facility]-INTERV RAD  . LESION REMOVAL N/A 05/31/2015   Procedure: REMOVAL RIGHT AND LEFT LESIONS OF MANDIBLE;  Surgeon: Diona Browner, DDS;  Location: [facility];  Service: Oral Surgery;  Laterality: N/A;  . MALONEY DILATION  12/29/2010   RMR;  . NM MYOCAR PERF WALL MOTION  2006   "relavtiely normal" persantine, mild anterior thinning (breast attenuation artifact), no region of scar/ischemia  . OVARIAN CYST REMOVAL    . RECTOCELE REPAIR N/A 06/29/2015   Procedure: POSTERIOR REPAIR (RECTOCELE);  Surgeon: Jonnie Kind, MD;  Location: [facility] ORS;  Service: Gynecology;  Laterality: N/A;  . REDUCTION MAMMAPLASTY Bilateral   . SPINE SURGERY  09/29/2010   Dr. Rolena Infante  . surgical excision of 3 tumors from right thigh and right buttock  and left upper thigh  2010  . TOOTH EXTRACTION Bilateral 12/14/2014   Procedure: REMOVAL OF BILATERAL MANDIBULAR EXOSTOSES;   Surgeon: Diona Browner, DDS;  Location: [facility];  Service: Oral Surgery;  Laterality: Bilateral;  . TRANSTHORACIC ECHOCARDIOGRAM  2010   EF 60-65%, mild conc LVH, grade 1 diastolic dysfunction; mildly calcified MV annulus with mildly thickened leaflets, mildly calcified MR annulus   Social History:  Social History   Socioeconomic History  . Marital status: Divorced    Spouse name: Not on file  . Number of children: 1  . Years of education: 70  . Highest education level: High school graduate  Occupational History  . Occupation: Disabled  Tobacco Use  . Smoking status: Current Every Day Smoker    Packs/day: 0.50    Years: 30.00    Pack years: 15.00    Types: Cigarettes  . Smokeless tobacco: Never Used  . Tobacco comment: 5-10 cigs a day  Vaping Use  . Vaping Use: Never used  Substance and Sexual Activity  . Alcohol use: No    Alcohol/week: 0.0 standard drinks    Comment:    . Drug use: No  . Sexual activity: Not Currently  Other Topics Concern  . Not on file  Social History Narrative   01/29/18 Lives alone, has 3 aides, Mon- Fri 8 hrs, 2 hrs on Sat-Sun, RN manages her meds   Caffeine  use: Drink coffee sometimes    Right handed    Social Determinants of Health   Financial Resource Strain: Low Risk   . Difficulty of Paying Living Expenses: Not very hard  Food Insecurity: No Food Insecurity  . Worried About Charity fundraiser in the Last Year: Never true  . Ran Out of Food in the Last Year: Never true  Transportation Needs: No Transportation Needs  . Lack of Transportation (Medical): No  . Lack of Transportation (Non-Medical): No  Physical Activity: Inactive  . Days of Exercise per Week: 0 days  . Minutes of Exercise per Session: 0 min  Stress: Stress Concern Present  . Feeling of Stress : To some extent  Social Connections: Moderately Isolated  . Frequency of Communication with Friends and Family: More than three times a week  . Frequency of Social Gatherings with  Friends and Family: More than three times a week  . Attends Religious Services: More than 4 times per year  . Active Member of Clubs or Organizations: No  . Attends Archivist Meetings: Never  . Marital Status: Divorced  Human resources officer Violence: Not At Risk  . Fear of Current or Ex-Partner: No  . Emotionally Abused: No  . Physically Abused: No  . Sexually Abused: No   Family History:  Family History  Problem Relation Age of Onset  . Heart attack Mother        HTN  . Pneumonia Father   . Kidney failure Father   . Diabetes Father   . Pancreatic cancer Sister   . Diabetes Brother   . Hypertension Brother   . Diabetes Brother   . Cancer Sister        breast   . Hypertension Son   . Sleep apnea Son   . Cancer Sister        pancreatic  . Stroke Maternal Grandmother   . Heart attack Maternal Grandfather   . Alcohol abuse Maternal Uncle   . Colon cancer Neg Hx   . Anesthesia problems Neg Hx   . Hypotension Neg Hx   . Malignant hyperthermia Neg Hx   . Pseudochol deficiency Neg Hx   . Breast cancer Neg Hx     Review of Systems: Constitutional: Doesn't report fevers, chills or abnormal weight loss Eyes: Doesn't report blurriness of vision Ears, nose, mouth, throat, and face: Doesn't report sore throat Respiratory: Doesn't report cough, dyspnea or wheezes Cardiovascular: Doesn't report palpitation, chest discomfort  Gastrointestinal:  Doesn't report nausea, constipation, diarrhea GU: Doesn't report incontinence Skin: Doesn't report skin rashes Neurological: Per HPI Musculoskeletal: Doesn't report joint pain Behavioral/Psych: ++anxiety, depression  Physical Exam: Vitals:   04/27/20 1200  BP: (!) 169/59  Pulse: 73  Resp: 18  Temp: (!) 97.2 F (36.2 C)  SpO2: 100%   KPS: 70. General: Alert, cooperative, pleasant, in no acute distress Head: Normal EENT: No conjunctival injection or scleral icterus.  Lungs: Resp effort normal Cardiac: Regular  rate Abdomen: Non-distended abdomen Skin: No rashes cyanosis or petechiae. Extremities: Limited ROM  Neurologic Exam: Mental Status: Awake, alert, attentive to examiner. Oriented to self and environment. Language is fluent with intact comprehension.  Cranial Nerves: Visual acuity is grossly normal. Visual fields are full. Extra-ocular movements intact. No ptosis. Face is symmetric Motor: Tone and bulk are normal. Power is full in both arms and legs, motor exam limited by pain and ROM issues Sensory: Grossly intact Gait: Assisted secondary to orthopedic limitations   Labs: I  have reviewed the data as listed    Component Value Date/Time   NA 140 02/20/2020 1600   NA 146 (H) 12/11/2019 1224   K 4.3 02/20/2020 1600   CL 108 02/20/2020 1600   CO2 25 02/20/2020 1600   GLUCOSE 108 (H) 02/20/2020 1600   BUN 32 (H) 02/20/2020 1600   BUN 23 12/11/2019 1224   CREATININE 1.50 (H) 02/20/2020 1600   CREATININE 1.24 (H) 09/30/2018 1143   CALCIUM 8.6 (L) 02/20/2020 1600   PROT 7.0 02/20/2020 1600   PROT 6.8 02/12/2019 1150   ALBUMIN 3.8 02/20/2020 1600   ALBUMIN 4.3 02/12/2019 1150   AST 21 02/20/2020 1600   ALT 24 02/20/2020 1600   ALKPHOS 88 02/20/2020 1600   BILITOT 0.6 02/20/2020 1600   BILITOT 0.2 02/12/2019 1150   GFRNONAA 37 (L) 02/20/2020 1600   GFRNONAA 45 (L) 09/30/2018 1143   GFRAA 72 12/11/2019 1224   GFRAA 52 (L) 09/30/2018 1143   Lab Results  Component Value Date   WBC 9.1 02/20/2020   NEUTROABS 5.9 02/20/2020   HGB 10.2 (L) 02/20/2020   HCT 36.4 02/20/2020   MCV 81.1 02/20/2020   PLT 223 02/20/2020    Imaging: [employer] Clinician Interpretation: I have personally reviewed the CNS images as listed.  My interpretation, in the context of the patient's clinical presentation, is stable disease  MR Brain W Wo Contrast  Result Date: 04/22/2020 CLINICAL DATA:  Brain/CNS neoplasm, surveillance. Follow-up of treated meningioma. EXAM: MRI HEAD WITHOUT AND WITH CONTRAST  TECHNIQUE: Multiplanar, multiecho pulse sequences of the brain and surrounding structures were obtained without and with intravenous contrast. CONTRAST:  38m MULTIHANCE GADOBENATE DIMEGLUMINE 529 MG/ML IV SOLN COMPARISON:  MRI of the brain April 18, 2019. FINDINGS: Brain: No acute infarction, hemorrhage, hydrocephalus or extra-axial collection. Posttreatment changes from frontal approach meningioma resection with encephalomalacia and gliosis in the bilateral inferior frontal lobes. Interval decrease in size of the residual enhancing tumor in the planum sphenoidale, eccentric to the right, now measuring 11 x 8 mm as compared to 11 x 14 mm on prior. No new focus of abnormal contrast enhancement identified. Stable T2 hyperintensity within the pons, nonspecific. Vascular: Normal flow voids. Skull and upper cervical spine: Postsurgical changes from bifrontal craniotomy. No focal marrow lesion identified. Sinuses/Orbits: Negative. IMPRESSION: 1. Interval decrease in size of the residual enhancing tumor in the planum sphenoidale, now measuring 11 x 8 mm as compared to 11 x 14 mm on prior. 2. No new focus of abnormal contrast enhancement identified. Electronically Signed   By: KPedro EarlsM.D.   On: 04/22/2020 12:50     Assessment/Plan Meningioma ([place] [D32.9]  We appreciate the opportunity to participate in the care of ARebbeca PaulPage.   She is stable from neurologic standpoint, and MRI demonstrates slight contraction of enhancing mass in the orbitofrontal cortex.  She continues to live with high morbidity, physical and pscyhological.  We counseled her today regarding healthy choices with exercise, food, sleep.    We strongly recommended she resume therapy with nocturnal CPAP and visit with pulmonology team for assistance if necessary.   We spent twenty additional minutes teaching regarding the natural history, biology, and historical experience in the treatment of brain tumors. We then  discussed in detail the current recommendations for therapy focusing on the mode of administration, mechanism of action, anticipated toxicities, and quality of life issues associated with this plan. We also provided teaching sheets for the patient to take home as  an additional resource.  We ask that Kyler Lerette Towe return to clinic in 12 months following next brain MRI, or sooner as needed.  All questions were answered. The patient knows to call the clinic with any problems, questions or concerns. No barriers to learning were detected.  The total time spent in the encounter was 45 minutes and more than 50% was on counseling and review of test results   Ventura Sellers, MD Medical Director of Neuro-Oncology [facility] at [place] 04/27/20 12:09 PM

## 2020-04-28 ENCOUNTER — Ambulatory Visit (INDEPENDENT_AMBULATORY_CARE_PROVIDER_SITE_OTHER): Payer: Medicare HMO | Admitting: Nurse Practitioner

## 2020-04-28 ENCOUNTER — Encounter: Payer: Self-pay | Admitting: Nurse Practitioner

## 2020-04-28 ENCOUNTER — Telehealth: Payer: Self-pay | Admitting: Internal Medicine

## 2020-04-28 DIAGNOSIS — M4716 Other spondylosis with myelopathy, lumbar region: Secondary | ICD-10-CM | POA: Diagnosis not present

## 2020-04-28 DIAGNOSIS — R29898 Other symptoms and signs involving the musculoskeletal system: Secondary | ICD-10-CM | POA: Diagnosis not present

## 2020-04-28 DIAGNOSIS — L299 Pruritus, unspecified: Secondary | ICD-10-CM

## 2020-04-28 DIAGNOSIS — R42 Dizziness and giddiness: Secondary | ICD-10-CM | POA: Diagnosis not present

## 2020-04-28 MED ORDER — MECLIZINE HCL 25 MG PO TABS: 25.0000 mg | ORAL_TABLET | Freq: Three times a day (TID) | ORAL | 0 refills | Status: DC | PRN

## 2020-04-28 MED ORDER — HYDROXYZINE HCL 25 MG PO TABS: 25.0000 mg | ORAL_TABLET | Freq: Three times a day (TID) | ORAL | 0 refills | Status: DC | PRN

## 2020-04-28 NOTE — Assessment & Plan Note (Signed)
-  pt was seen in ED in Dec for narcotic overdose -she has dry mouth while talking today, and states she is still using these -suspect medication-related pruritis; no visible rash today -Rx. hydroxyzine

## 2020-04-28 NOTE — Progress Notes (Signed)
Acute Office Visit  Subjective:    Patient ID: [name], female    DOB: [birth year], 70 y.o.   MRN: [id number]  Chief Complaint  Patient presents with  . Dizziness    X 2 months   . Urticaria    All over; sensation of things crawling in head and ears.     HPI Patient is in today for dizziness and itching. She fell and had right ankle fracture in Dec. 2021. She states that she is still dizzy today.  She was prescribed meclizine, but didn't pick this up.  She has itching sensation. She states this started 2 weeks ago. She feels like something is crawling in her head.  Past Medical History:  Diagnosis Date  . Allergy   . Anemia   . Anxiety    takes Ativan daily  . Arthritis   . Assistance needed for mobility   . Bipolar disorder ([place])    takes Risperdal nightly  . Blood transfusion   . Brain tumor ([place])   . Cancer ([place])    In her gum  . Carpal tunnel syndrome of right wrist 05/23/2011  . Cervical disc disorder with radiculopathy of cervical region 10/31/2012  . Chronic back pain   . Chronic idiopathic constipation   . Chronic neck and back pain   . Colon polyps   . COPD (chronic obstructive pulmonary disease) with chronic bronchitis ([place]) 09/16/2013   Office Spirometry 10/30/2013-submaximal effort based on appearance of loop and curve. Numbers would fit with severe restriction but her physiologic capability may be better than this. FVC 0.91/44%, and 10.74/45%, FEV1/FVC 0.81, FEF 25-75% 1.43/69%    . Diabetes mellitus    Type II  . Diverticulosis    TCS 9/08 by Dr. Delfin Edis for diarrhea . Bx for micro scopic colitis negative.   . Fibromyalgia   . Frequent falls   . GERD (gastroesophageal reflux disease)    takes Aciphex daily  . Glaucoma    eye drops daily  . Gum symptoms    infection on antibiotic  . Hemiplegia affecting non-dominant side, post-stroke 08/02/2011  . Hiatal hernia   . Hyperlipidemia    takes Crestor daily  . Hypertension    takes  Amlodipine,Metoprolol,and Clonidine daily  . Hypothyroidism    takes Synthroid daily  . IBS (irritable bowel syndrome)   . Insomnia    takes Trazodone nightly  . Major depression, recurrent ([place])    takes Zoloft daily  . 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 Metabolic encephalopathy 0/45/9977  . Migraines    chronic headaches  . Mononeuritis lower limb   . Narcolepsy   . Osteoporosis   . Pancreatitis 2006   due to Depakote with normal EUS   . Schatzki's ring    non critical / EGD with ED 8/2011with RMR  . Seizures ([place])    takes Lamictal daily.Last seizure 3 yrs ago  . Sleep apnea    on CPAP  . Small bowel obstruction ([place])   . Stroke [facility])    left sided weakness, speech changes  . Tubular adenoma of colon     Past Surgical History:  Procedure Laterality Date  . ABDOMINAL HYSTERECTOMY  1978  . BACK SURGERY  July 2012  . BACTERIAL OVERGROWTH TEST N/A 05/05/2013   Procedure: BACTERIAL OVERGROWTH TEST;  Surgeon: Daneil Dolin, MD;  [facility];  Service: Endoscopy;  Laterality: N/A;  7:30  . BIOPSY THYROID  2009  . BRAIN SURGERY  11/2011   resection of meningioma  . BREAST REDUCTION SURGERY  1994  . CARDIAC CATHETERIZATION  05/10/2005   normal coronaries, normal LV systolic function and EF (Dr. Jackie Plum)  . CARPAL TUNNEL RELEASE Left 07/22/04   Dr. Aline Brochure  . CATARACT EXTRACTION Bilateral   . CHOLECYSTECTOMY  1984  . COLONOSCOPY N/A 09/25/2012   YSA:[id number] diverticulosis.  colonic polyp-removed : tubular adenoma  . CRANIOTOMY  11/23/2011   Procedure: CRANIOTOMY TUMOR EXCISION;  Surgeon: Hosie Spangle, MD;  Location: [facility];  Service: Neurosurgery;  Laterality: N/A;  Craniotomy for tumor resection  . ESOPHAGOGASTRODUODENOSCOPY  12/29/2010   Rourk-Retained food in the esophagus and stomach, small hiatal hernia, status post Maloney dilation of the esophagus  . ESOPHAGOGASTRODUODENOSCOPY N/A 09/25/2012   UXN:ATFTDDUK atonic baggy esophagus status post  Maloney dilation 53 F. Hiatal hernia  . GIVENS CAPSULE STUDY N/A 01/15/2013   NORMAL.   . IR GENERIC HISTORICAL  03/17/2016   IR RADIOLOGIST EVAL & MGMT 03/17/2016 [facility]INTER[facility] RAD  . LESION REMOVAL N/A 05/31/2015   Procedure: REMOVAL RIGHT AND LEFT LESIONS OF MANDIBLE;  Surgeon: Diona Browner, DDS;  Location: [facility];  Service: Oral Surgery;  Laterality: N/A;  . MALONEY DILATION  12/29/2010   RMR;  . NM MYOCAR PERF WALL MOTION  2006   "relavtiely normal" persantine, mild anterior thinning (breast attenuation artifact), no region of scar/ischemia  . OVARIAN CYST REMOVAL    . RECTOCELE REPAIR N/A 06/29/2015   Procedure: POSTERIOR REPAIR (RECTOCELE);  Surgeon: Jonnie Kind, MD;  Location: [facility];  Service: Gynecology;  Laterality: N/A;  . REDUCTION MAMMAPLASTY Bilateral   . SPINE SURGERY  09/29/2010   Dr. Rolena Infante  . surgical excision of 3 tumors from right thigh and right buttock  and left upper thigh  2010  . TOOTH EXTRACTION Bilateral 12/14/2014   Procedure: REMOVAL OF BILATERAL MANDIBULAR EXOSTOSES;  Surgeon: Diona Browner, DDS;  Location: [facility];  Service: Oral Surgery;  Laterality: Bilateral;  . TRANSTHORACIC ECHOCARDIOGRAM  2010   EF 60-65%, mild conc LVH, grade 1 diastolic dysfunction; mildly calcified MV annulus with mildly thickened leaflets, mildly calcified MR annulus    Family History  Problem Relation Age of Onset  . Heart attack Mother        HTN  . Pneumonia Father   . Kidney failure Father   . Diabetes Father   . Pancreatic cancer Sister   . Diabetes Brother   . Hypertension Brother   . Diabetes Brother   . Cancer Sister        breast   . Hypertension Son   . Sleep apnea Son   . Cancer Sister        pancreatic  . Stroke Maternal Grandmother   . Heart attack Maternal Grandfather   . Alcohol abuse Maternal Uncle   . Colon cancer Neg Hx   . Anesthesia problems Neg Hx   . Hypotension Neg Hx   . Malignant hyperthermia Neg Hx   . Pseudochol deficiency Neg Hx   . Breast  cancer Neg Hx     Social History   Socioeconomic History  . Marital status: Divorced    Spouse name: Not on file  . Number of children: 1  . Years of education: 60  . Highest education level: High school graduate  Occupational History  . Occupation: Disabled  Tobacco Use  . Smoking status: Current Every Day Smoker    Packs/day: 0.50    Years: 30.00  Pack years: 15.00    Types: Cigarettes  . Smokeless tobacco: Never Used  . Tobacco comment: 5-10 cigs a day  Vaping Use  . Vaping Use: Never used  Substance and Sexual Activity  . Alcohol use: No    Alcohol/week: 0.0 standard drinks    Comment:    . Drug use: No  . Sexual activity: Not Currently  Other Topics Concern  . Not on file  Social History Narrative   01/29/18 Lives alone, has 3 aides, Mon- Fri 8 hrs, 2 hrs on Sat-Sun, RN manages her meds   Caffeine use: Drink coffee sometimes    Right handed    Social Determinants of Health   Financial Resource Strain: Low Risk   . Difficulty of Paying Living Expenses: Not very hard  Food Insecurity: No Food Insecurity  . Worried About Charity fundraiser in the Last Year: Never true  . Ran Out of Food in the Last Year: Never true  Transportation Needs: No Transportation Needs  . Lack of Transportation (Medical): No  . Lack of Transportation (Non-Medical): No  Physical Activity: Inactive  . Days of Exercise per Week: 0 days  . Minutes of Exercise per Session: 0 min  Stress: Stress Concern Present  . Feeling of Stress : To some extent  Social Connections: Moderately Isolated  . Frequency of Communication with Friends and Family: More than three times a week  . Frequency of Social Gatherings with Friends and Family: More than three times a week  . Attends Religious Services: More than 4 times per year  . Active Member of Clubs or Organizations: No  . Attends Archivist Meetings: Never  . Marital Status: Divorced  Human resources officer Violence: Not At Risk  . Fear  of Current or Ex-Partner: No  . Emotionally Abused: No  . Physically Abused: No  . Sexually Abused: No    Outpatient Medications Prior to Visit  Medication Sig Dispense Refill  . Accu-Chek Softclix Lancets lancets TEST BLOOD SUGAR THREE TIMES DAILY AS DIRECTED 300 each 2  . albuterol (VENTOLIN HFA) 108 (90 Base) MCG/ACT inhaler INHALE 1 TO 2 PUFFS EVERY 6 HOURS AS NEEDED FOR WHEEZING, SHORTNESS OF BREATH 1 each 3  . Alcohol Swabs (B-D SINGLE USE SWABS REGULAR) PADS USE TO CLEAN FINGER PRIOR TO STICKING FOR BLOOD SUGAR. 100 each 11  . alendronate (FOSAMAX) 70 MG tablet TAKE 1 TABLET (70 MG TOTAL) BY MOUTH EVERY 7 (SEVEN) DAYS. TAKE WITH A FULL GLASS OF WATER ON AN EMPTY STOMACH. 12 tablet 3  . amLODipine (NORVASC) 10 MG tablet TAKE 1 TABLET EVERY DAY 90 tablet 3  . ascorbic acid (VITAMIN C) 500 MG tablet Take 500 mg by mouth daily.    [name] aspirin EC 81 MG tablet Take 1 tablet (81 mg total) by mouth daily with breakfast. 120 tablet 2  . benzonatate (TESSALON) 100 MG capsule Take 1 capsule (100 mg total) by mouth 2 (two) times daily as needed for cough. 20 capsule 0  . betamethasone dipropionate 0.05 % cream Apply topically 2 (two) times daily as needed (Rash). 45 g 3  . Blood Glucose Calibration (ACCU-CHEK GUIDE CONTROL) LIQD USE PRN TO CALIBRATE GLUCOMETER 3 each 11  . blood glucose meter kit and supplies Dispense based on patient and insurance preference. Use up to four times daily as directed. (FOR ICD-10 E10.9, E11.9). 1 each 0  . buPROPion (WELLBUTRIN XL) 150 MG 24 hr tablet Take 1 tablet (150 mg total) by mouth  every morning. 90 tablet 2  . cetirizine (ZYRTEC) 10 MG tablet Take 1 tablet (10 mg total) by mouth daily. 7 tablet 0  . clobetasol cream (TEMOVATE) 6.26 % Apply 1 application topically 2 (two) times daily.     . Continuous Blood Gluc Sensor (FREESTYLE LIBRE 14 DAY SENSOR) MISC 1 each by Does not apply route every 14 (fourteen) days. Change every 2 weeks 2 each 11  . cromolyn  (NASALCROM) 5.2 MG/ACT nasal spray Place 1 spray into both nostrils 4 (four) times daily. 13 mL 1  . diclofenac Sodium (VOLTAREN) 1 % GEL APPLY 2 GRAMS  TOPICALLY 4 (FOUR) TIMES DAILY. 800 g 0  . dicyclomine (BENTYL) 10 MG capsule Take 1 capsule (10 mg total) by mouth 3 (three) times daily as needed for spasms. 90 capsule 2  . ezetimibe (ZETIA) 10 MG tablet TAKE 1 TABLET EVERY DAY 90 tablet 1  . glucose blood (ACCU-CHEK GUIDE) test strip USE TO CHECK BLOOD SUGAR FOUR TIMES A DAY AND PRN 400 each 11  . insulin aspart (NOVOLOG FLEXPEN) 100 UNIT/ML FlexPen Inject 16 to 18 units under skin before meals up to 3 times a day 45 mL 3  . Insulin Pen Needle (SURE COMFORT PEN NEEDLES) 31G X 8 MM MISC USE TO for injecting insulin 4 TIMES DAILY. 200 each 3  . lamoTRIgine (LAMICTAL) 100 MG tablet Take 1 tablet (100 mg total) by mouth 2 (two) times daily. 180 tablet 2  . levothyroxine (SYNTHROID) 50 MCG tablet TAKE 1 TABLET DAILY, EXCEPT TAKE 1/2 TABLET ON SUNDAY 85 tablet 1  . lidocaine (XYLOCAINE) 2 % solution RINSE WITH 15MLS AND SPIT OUT AFTER 30 SECONDS FOUR TIMES DAILY AS NEEDED FOR PAIN.    [name] LORazepam (ATIVAN) 0.5 MG tablet Take 1 tablet (0.5 mg total) by mouth 2 (two) times daily. 180 tablet 0  . losartan (COZAAR) 50 MG tablet TAKE 1 TABLET EVERY DAY 90 tablet 1  . metoprolol tartrate (LOPRESSOR) 50 MG tablet TAKE 1 TABLET TWICE DAILY (NEED MD APPOINTMENT) 180 tablet 3  . montelukast (SINGULAIR) 10 MG tablet TAKE 1 TABLET EVERY DAY 90 tablet 1  . nicotine polacrilex (COMMIT) 4 MG lozenge Take 1 lozenge (4 mg total) by mouth as needed for smoking cessation. 100 tablet 0  . nystatin (MYCOSTATIN/NYSTOP) powder APPLY TO AFFECTED AREA 4 TIMES DAILY. 90 g 1  . Omega-3 Fatty Acids (FISH OIL PO) Take 1 capsule by mouth daily.    [name] oxyCODONE (OXY IR/ROXICODONE) 5 MG immediate release tablet Take by mouth.    . oxyCODONE (OXY IR/ROXICODONE) 5 MG immediate release tablet Take 5 mg by mouth every 6 (six) hours as  needed.    [name] oxyCODONE-acetaminophen (PERCOCET) 10-325 MG tablet Take 1 tablet by mouth every 8 (eight) hours as needed.     [name] Plecanatide (TRULANCE) 3 MG TABS Take 3 mg by mouth daily. 30 tablet 3  . pregabalin (LYRICA) 75 MG capsule Take 1 capsule (75 mg total) by mouth daily.    . RABEprazole (ACIPHEX) 20 MG tablet TAKE 1 TABLET TWICE DAILY (NEED MD APPOINTMENT) 180 tablet 0  . risperiDONE (RISPERDAL) 0.5 MG tablet Take 1 tablet (0.5 mg total) by mouth at bedtime. 90 tablet 2  . rosuvastatin (CRESTOR) 5 MG tablet TAKE 1 TABLET AT BEDTIME 90 tablet 2  . sertraline (ZOLOFT) 100 MG tablet Take 1 tablet (100 mg total) by mouth daily. 90 tablet 2  . STIOLTO RESPIMAT 2.5-2.5 MCG/ACT AERS INHALE 2 PUFFS INTO THE  LUNGS DAILY. 12 g 3  . TOUJEO MAX SOLOSTAR 300 UNIT/ML Solostar Pen INJECT 24 UNITS SUBCUTANEOUSLY INTO THE SKIN AT BEDTIME 12 mL 3  . traZODone (DESYREL) 100 MG tablet Take 1 tablet (100 mg total) by mouth at bedtime. 90 tablet 2  . triamcinolone (KENALOG) 0.1 % Apply 1 application topically daily. 15 g 1  . UNABLE TO [facility] bed mattress x 1  DX: G47.33, J44.9 1 each 0  . UNABLE TO FIND Standard wheelchair Dx M47.16, R29.898 1 each 0  . meclizine (ANTIVERT) tablet 25 mg      No facility-administered medications prior to visit.    Allergies  Allergen Reactions  . Lac Bovis Rash    Doesn't agree with stomach.   . Phenazopyridine Hcl Hives  . Cephalexin Hives  . Flonase [Fluticasone]     "It gave me ulcers in my nose"  . Iron Nausea And Vomiting  . Milk-Related Compounds Other (See Comments)    Doesn't agree with stomach.   . Penicillins Hives    Has patient had a PCN reaction causing immediate rash, facial/tongue/throat swelling, SOB or lightheadedness with hypotension: Yes Has patient had a PCN reaction causing severe rash involving mucus membranes or skin necrosis: No Has patient had a PCN reaction that required hospitalization No Has patient had a PCN reaction  occurring within the last 10 years: No If all of the above answers are "NO", then may proceed with Cephalosporin use.   [name] Phenazopyridine Hcl Hives          Review of Systems  Constitutional: Negative.   Respiratory: Negative.   Cardiovascular: Negative.   Skin:       Generalized itching  Neurological: Positive for dizziness.       Objective:    Physical Exam Constitutional:      Appearance: She is obese.  Cardiovascular:     Rate and Rhythm: Normal rate and regular rhythm.     Pulses: Normal pulses.     Heart sounds: Normal heart sounds.  Pulmonary:     Effort: Pulmonary effort is normal.     Breath sounds: Normal breath sounds.  Musculoskeletal:     Comments: Right foot in boot  Skin:    General: Skin is warm and dry.     Findings: No rash.  Psychiatric:        Mood and Affect: Mood normal.        Behavior: Behavior normal.        Thought Content: Thought content normal.        Judgment: Judgment normal.     Comments: Slowed speech     BP (!) 143/70   Pulse 68   Temp 99.4 F (37.4 C)   Resp 20   Ht _0  (1.499 m)   Wt 171 lb (77.6 kg)   SpO2 96%   BMI 34.54 kg/m  Wt Readings from Last 3 Encounters:  04/28/20 171 lb (77.6 kg)  04/27/20 170 lb (77.1 kg)  04/20/20 170 lb (77.1 kg)    Health Maintenance Due  Topic Date Due  . OPHTHALMOLOGY EXAM  03/17/2020    There are no preventive care reminders to display for this patient.   Lab Results  Component Value Date   TSH 0.631 12/11/2019   Lab Results  Component Value Date   WBC 9.1 02/20/2020   HGB 10.2 (L) 02/20/2020   HCT 36.4 02/20/2020   MCV 81.1 02/20/2020   PLT 223 02/20/2020   Lab  Results  Component Value Date   NA 140 02/20/2020   K 4.3 02/20/2020   CO2 25 02/20/2020   GLUCOSE 108 (H) 02/20/2020   BUN 32 (H) 02/20/2020   CREATININE 1.50 (H) 02/20/2020   BILITOT 0.6 02/20/2020   ALKPHOS 88 02/20/2020   AST 21 02/20/2020   ALT 24 02/20/2020   PROT 7.0 02/20/2020   ALBUMIN  3.8 02/20/2020   CALCIUM 8.6 (L) 02/20/2020   ANIONGAP 7 02/20/2020   GFR 21.94 (L) 11/03/2014   Lab Results  Component Value Date   CHOL 125 09/23/2019   Lab Results  Component Value Date   HDL 45 09/23/2019   Lab Results  Component Value Date   LDLCALC 59 09/23/2019   Lab Results  Component Value Date   TRIG 117 09/23/2019   Lab Results  Component Value Date   CHOLHDL 3.5 09/30/2018   Lab Results  Component Value Date   HGBA1C 5.3 03/25/2020       Assessment & Plan:   Problem List Items Addressed This Visit      Musculoskeletal and Integument   Pruritus    -pt was seen in ED in Dec for narcotic overdose -she has dry mouth while talking today, and states she is still using these -suspect medication-related pruritis; no visible rash today -Rx. hydroxyzine        Other   Dizziness    Rx. Meclizine; she was not able to pick up this rx last time          Meds ordered this encounter  Medications  . hydrOXYzine (ATARAX/VISTARIL) 25 MG tablet    Sig: Take 1 tablet (25 mg total) by mouth 3 (three) times daily as needed.    Dispense:  30 tablet    Refill:  0  . meclizine (ANTIVERT) 25 MG tablet    Sig: Take 1 tablet (25 mg total) by mouth 3 (three) times daily as needed for dizziness.    Dispense:  30 tablet    Refill:  0     Noreene Larsson, NP

## 2020-04-28 NOTE — Telephone Encounter (Signed)
Called and spoke with patient who states that her CPAP machine was recalled and that needs order for new CPAP machine. Patient uses [facility]. There was an order placed on 01/02/20 but there is a note that says: Hey, the recall units are being replaced by [facility] so unable to supply pt with another unit.  PCC's patient mentioned switching to [facility]. Do we need to send this order to a different DME so the patient is able to get new machine?

## 2020-04-28 NOTE — Assessment & Plan Note (Signed)
Rx. Meclizine; she was not able to pick up this rx last time

## 2020-04-29 DIAGNOSIS — F17210 Nicotine dependence, cigarettes, uncomplicated: Secondary | ICD-10-CM | POA: Diagnosis not present

## 2020-04-29 DIAGNOSIS — R2681 Unsteadiness on feet: Secondary | ICD-10-CM | POA: Diagnosis not present

## 2020-04-29 DIAGNOSIS — J42 Unspecified chronic bronchitis: Secondary | ICD-10-CM | POA: Diagnosis not present

## 2020-04-29 DIAGNOSIS — E039 Hypothyroidism, unspecified: Secondary | ICD-10-CM | POA: Diagnosis not present

## 2020-04-29 DIAGNOSIS — J449 Chronic obstructive pulmonary disease, unspecified: Secondary | ICD-10-CM | POA: Diagnosis not present

## 2020-04-29 DIAGNOSIS — E1165 Type 2 diabetes mellitus with hyperglycemia: Secondary | ICD-10-CM | POA: Diagnosis not present

## 2020-04-29 DIAGNOSIS — E1142 Type 2 diabetes mellitus with diabetic polyneuropathy: Secondary | ICD-10-CM | POA: Diagnosis not present

## 2020-04-29 DIAGNOSIS — M160 Bilateral primary osteoarthritis of hip: Secondary | ICD-10-CM | POA: Diagnosis not present

## 2020-04-29 DIAGNOSIS — F250 Schizoaffective disorder, bipolar type: Secondary | ICD-10-CM | POA: Diagnosis not present

## 2020-04-29 NOTE — Telephone Encounter (Signed)
Called and spoke with patient to let her know that she needs to call [facility] at [phone] in regards to her CPAP machine. She repeated the number back to me and expressed understanding. Nothing further needed at this time.

## 2020-04-29 NOTE — Telephone Encounter (Signed)
Pt needs to call [facility] for them to replace her cpap we can switch companies but that may take longer to get one than if she calls [facility] there number is [phone]

## 2020-05-02 NOTE — Progress Notes (Addendum)
Subjective: [name] presents today for at risk foot care with history of diabetic neuropathy and painful porokeratotic lesion(s) right foot and painful mycotic toenails that limit ambulation. Painful toenails interfere with ambulation. Aggravating factors include wearing enclosed shoe gear. Pain is relieved with periodic professional debridement. Painful porokeratotic lesions are aggravated when weightbearing with and without shoegear. Pain is relieved with periodic professional debridement.   Patient states she is wearing an ankle brace due to fracture sustained in December after a fall. She is being seen by [facility]. She has upcoming appointment on May 12, 2020.  [name] Helper, MD is patient's PCP. Last visit was 12/15/2019.   Allergies  Allergen Reactions  . Lac Bovis Rash    Doesn't agree with stomach.   . Phenazopyridine Hcl Hives  . Cephalexin Hives  . Flonase [Fluticasone]     "It gave me ulcers in my nose"  . Iron Nausea And Vomiting  . Milk-Related Compounds Other (See Comments)    Doesn't agree with stomach.   . Penicillins Hives    Has patient had a PCN reaction causing immediate rash, facial/tongue/throat swelling, SOB or lightheadedness with hypotension: Yes Has patient had a PCN reaction causing severe rash involving mucus membranes or skin necrosis: No Has patient had a PCN reaction that required hospitalization No Has patient had a PCN reaction occurring within the last 10 years: No If all of the above answers are "NO", then may proceed with Cephalosporin use.   [name] Phenazopyridine Hcl Hives         Review of Systems: Negative except as noted in the HPI.   Objective: There were no vitals filed for this visit.  [name] is a pleasant 70 y.o. female in NAD. AAO X 3.  Vascular Examination: Capillary refill time to digits immediate b/l. Faintly palpable pedal pulses b/l. Pedal hair sparse. Lower extremity skin temperature gradient within  normal limits. No pain with calf compression b/l.  Dermatological Examination: Pedal skin with normal turgor, texture and tone bilaterally. No open wounds bilaterally. No interdigital macerations bilaterally. Toenails 1-5 right, L hallux, L 3rd toe, L 4th toe and L 5th toe elongated, discolored, dystrophic, thickened, and crumbly with subungual debris and tenderness to dorsal palpation. Porokeratotic lesion(s) sub 5th met base right foot, lateral aspect right foot and plantar aspect of heel right foot. No erythema, no edema, no drainage, no fluctuance.  Musculoskeletal Examination: Normal muscle strength 5/5 to all lower extremity muscle groups bilaterally. Hallux valgus with bunion deformity noted b/l lower extremities. Utilizes cane for ambulation assistance. Wearing Aircast ankle stirrup brace on RLE.  Neurological Examination: Protective sensation diminished with 10g monofilament b/l. Vibratory sensation intact b/l.  Hemoglobin A1C Latest Ref Rng & Units 03/25/2020 11/06/2019 06/24/2019  HGBA1C 4.0 - 5.6 % 5.3 5.9(A) 6.4(A)  Some recent data might be hidden   Assessment: 1. Pain due to onychomycosis of toenail   2. Porokeratosis   3. Hallux valgus, acquired, bilateral   4. Diabetic polyneuropathy associated with type 2 diabetes mellitus ([place])     Plan: -Examined patient. -Patient has h/o distal nondisplaced fibular fracture of RLE on February 20, 2020. Followed by [facility] and has follow up on May 12, 2020, for management of right fibula fracture. -Continue diabetic foot care principles. -Patient to continue soft, supportive shoe gear daily. -Toenails 1-5 right, L hallux, L 3rd toe, L 4th toe and L 5th toe debrided in length and girth without iatrogenic bleeding with sterile nail nipper and  dremel.  -Painful porokeratotic lesion(s) sub 5th met base right, lateral aspect right foot and plantar aspect of heel right foot pared and enucleated with sterile scalpel blade without  incident. -Patient to report any pedal injuries to medical professional immediately. -Patient/POA to call should there be question/concern in the interim.  Return in about 3 months (around 07/24/2020).  Marzetta Board, DPM

## 2020-05-03 ENCOUNTER — Other Ambulatory Visit: Payer: Self-pay

## 2020-05-03 ENCOUNTER — Other Ambulatory Visit (HOSPITAL_COMMUNITY): Payer: Self-pay

## 2020-05-03 ENCOUNTER — Ambulatory Visit (INDEPENDENT_AMBULATORY_CARE_PROVIDER_SITE_OTHER): Payer: Medicare HMO | Admitting: Psychiatry

## 2020-05-03 DIAGNOSIS — F17210 Nicotine dependence, cigarettes, uncomplicated: Secondary | ICD-10-CM | POA: Diagnosis not present

## 2020-05-03 DIAGNOSIS — E1142 Type 2 diabetes mellitus with diabetic polyneuropathy: Secondary | ICD-10-CM | POA: Diagnosis not present

## 2020-05-03 DIAGNOSIS — D500 Iron deficiency anemia secondary to blood loss (chronic): Secondary | ICD-10-CM

## 2020-05-03 DIAGNOSIS — E039 Hypothyroidism, unspecified: Secondary | ICD-10-CM | POA: Diagnosis not present

## 2020-05-03 DIAGNOSIS — R2681 Unsteadiness on feet: Secondary | ICD-10-CM | POA: Diagnosis not present

## 2020-05-03 DIAGNOSIS — D509 Iron deficiency anemia, unspecified: Secondary | ICD-10-CM

## 2020-05-03 DIAGNOSIS — F331 Major depressive disorder, recurrent, moderate: Secondary | ICD-10-CM | POA: Diagnosis not present

## 2020-05-03 DIAGNOSIS — M160 Bilateral primary osteoarthritis of hip: Secondary | ICD-10-CM | POA: Diagnosis not present

## 2020-05-03 DIAGNOSIS — J42 Unspecified chronic bronchitis: Secondary | ICD-10-CM | POA: Diagnosis not present

## 2020-05-03 DIAGNOSIS — J449 Chronic obstructive pulmonary disease, unspecified: Secondary | ICD-10-CM | POA: Diagnosis not present

## 2020-05-03 DIAGNOSIS — F250 Schizoaffective disorder, bipolar type: Secondary | ICD-10-CM | POA: Diagnosis not present

## 2020-05-03 DIAGNOSIS — E1165 Type 2 diabetes mellitus with hyperglycemia: Secondary | ICD-10-CM | POA: Diagnosis not present

## 2020-05-03 IMAGING — MR MRI HEAD WITHOUT AND WITH CONTRAST
11 series · 48 of 48 positions shown · IV contrast (15 ml multihance)
Comparison: 09/12/2018 and earlier.

CLINICAL DATA: 68-year-old female with recurrent planum sphenoidale
meningioma originally treated in 4503. Stereotactic radio surgery
planning.

EXAM:
MRI HEAD WITHOUT AND WITH CONTRAST
TECHNIQUE: Multiplanar, multiecho pulse sequences of the brain and surrounding
structures were obtained without and with intravenous contrast.
CONTRAST:  15mL MULTIHANCE GADOBENATE DIMEGLUMINE 529 MG/ML IV SOLN

[Series 2: FLAIR · sagittal · 3.0mm · 0.75mm/px · 2 of 39 slices shown (1 of 2)]
[im 1/39]
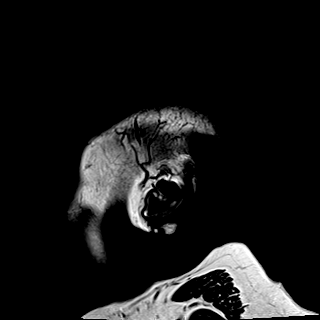
[im 39/39]
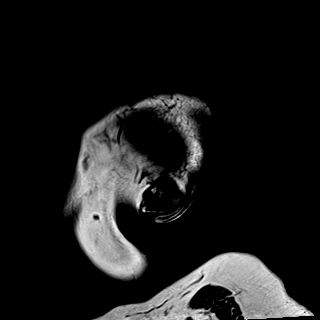

[Series 3: DWI · axial · 3.0mm · 1.50mm/px · z∈[-59,+87]mm · 5 of 78 slices shown (1 of 2)]
[im 1/78]
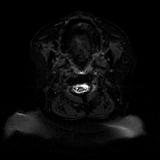
[im 20/78]
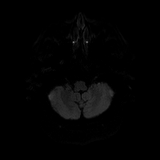
[im 39/78]
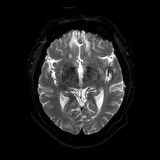
[im 58/78]
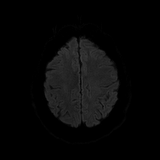
[im 78/78]
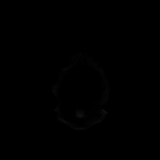

[Series 4: DWI · axial · 3.0mm · 1.50mm/px · z∈[-59,+87]mm · 2 of 39 slices shown (2 of 2)]
[im 1/39]
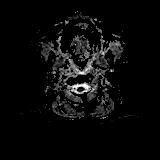
[im 39/39]
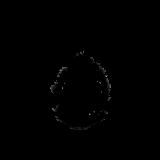

[Series 5: T2 · axial · 5.0mm · 0.57mm/px · z∈[-60,+93]mm · 2 of 27 slices shown]
[im 1/27]
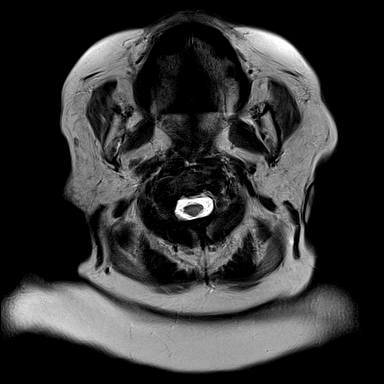
[im 27/27]
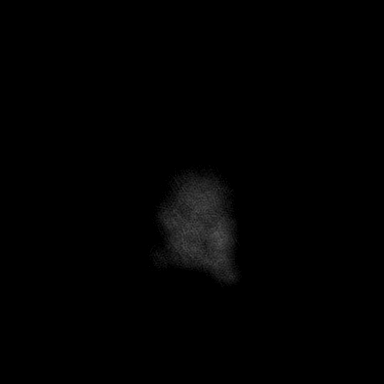

[Series 7: swi_images · axial · 1.5mm · 0.90mm/px · z∈[-53,+87]mm · 6 of 96 slices shown]
[im 1/96]
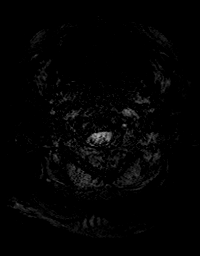
[im 20/96]
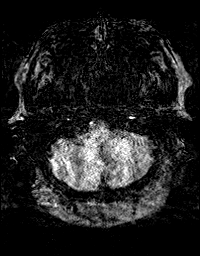
[im 39/96]
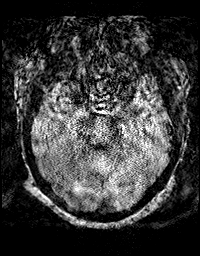
[im 58/96]
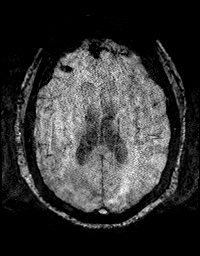
[im 77/96]
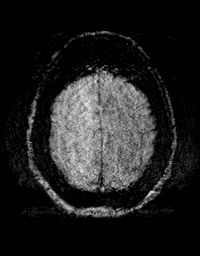
[im 96/96]
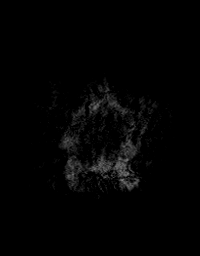

[Series 8: FLAIR · axial · 3.0mm · 0.57mm/px · z∈[-78,+75]mm · 3 of 52 slices shown (2 of 2)]
[im 1/52]
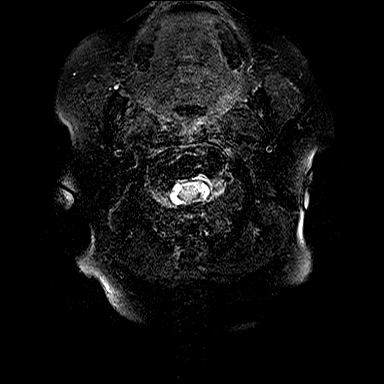
[im 26/52]
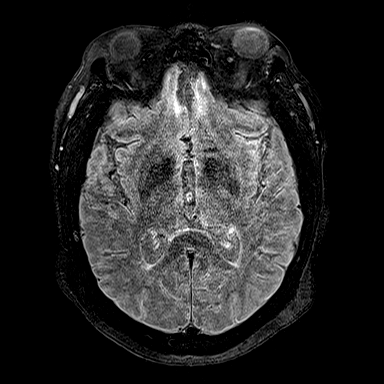
[im 52/52]
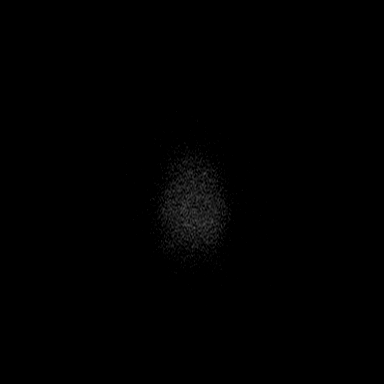

[Series 9: T1 · axial · 1.0mm · 0.75mm/px · z∈[-81,+78]mm · 10 of 160 slices shown]
[im 1/160]
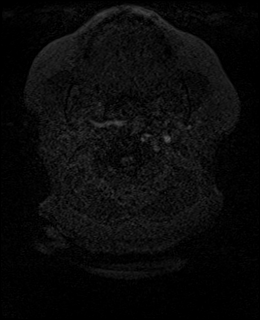
[im 18/160]
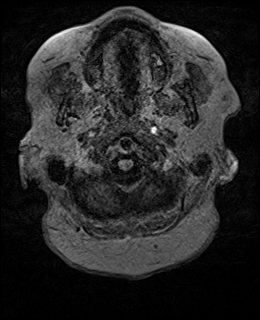
[im 36/160]
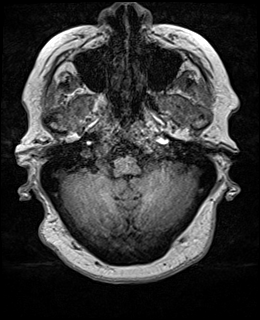
[im 54/160]
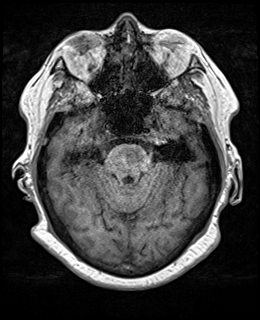
[im 71/160]
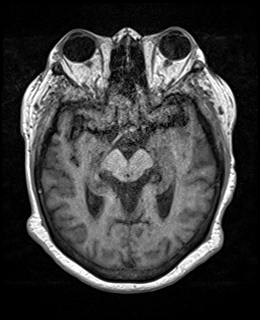
[im 89/160]
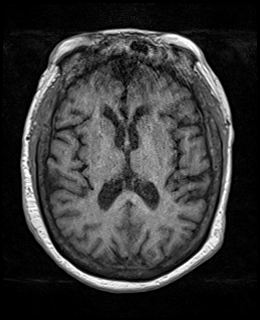
[im 107/160]
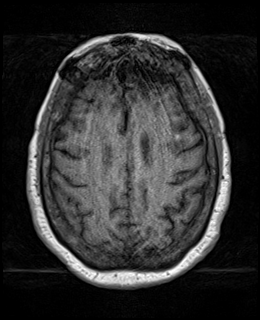
[im 124/160]
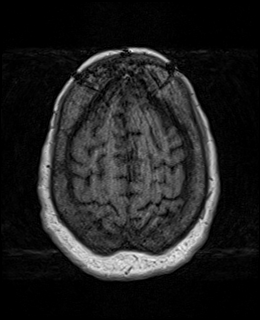
[im 142/160]
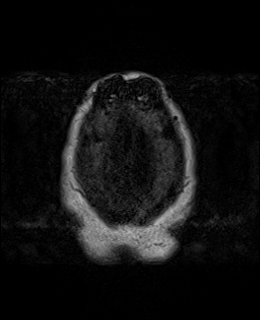
[im 160/160]
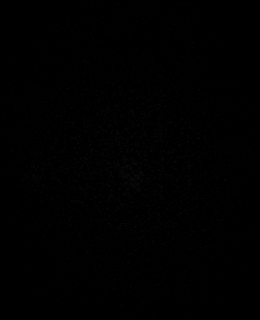

[Series 11: T1 post-contrast · axial · 1.0mm · 0.75mm/px · z∈[-81,+78]mm · 10 of 160 slices shown (1 of 2)]
[im 1/160]
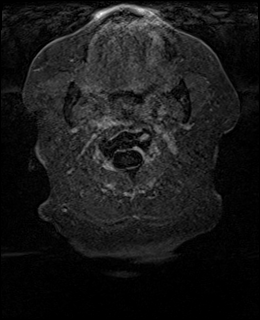
[im 18/160]
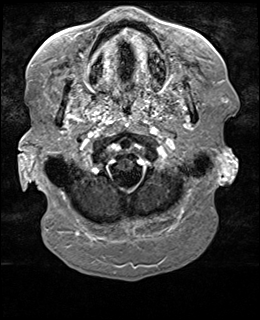
[im 36/160]
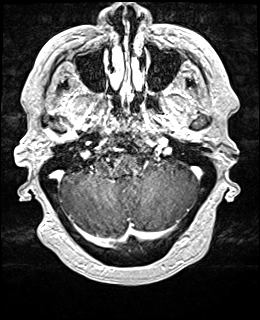
[im 54/160]
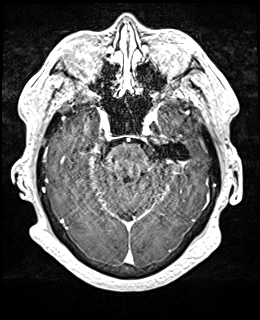
[im 71/160]
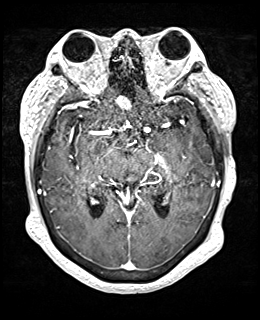
[im 89/160]
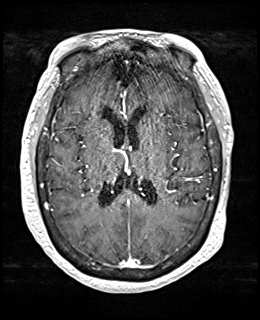
[im 107/160]
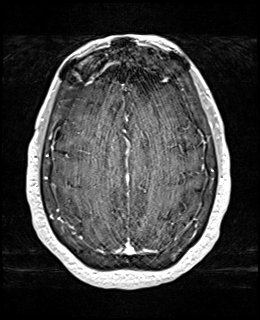
[im 124/160]
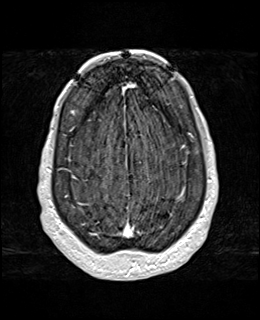
[im 142/160]
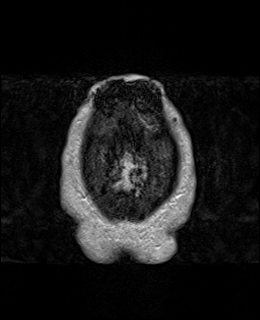
[im 160/160]
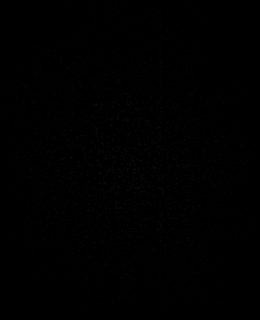

[Series 12: T1 post-contrast · coronal · 3.0mm · 0.57mm/px · 3 of 47 slices shown (2 of 2)]
[im 1/47]
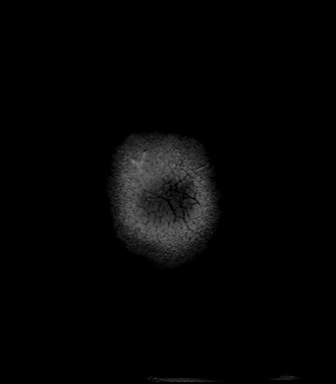
[im 24/47]
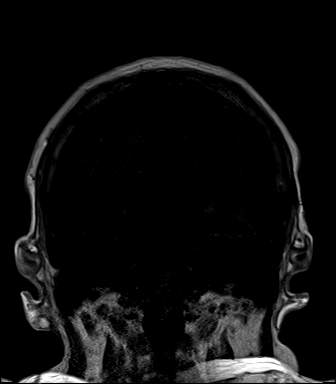
[im 47/47]
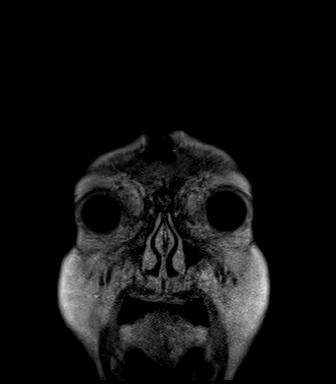

[Series 13: FLAIR post-contrast · sagittal · 3.0mm · 0.75mm/px · 2 of 39 slices shown]
[im 1/39]
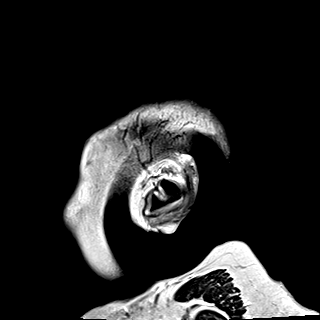
[im 39/39]
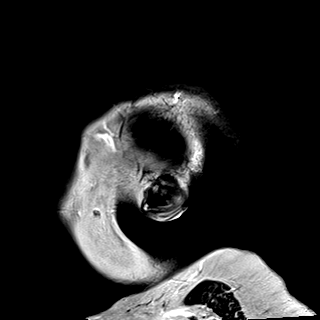

[Series 14: T2 post-contrast · coronal · 3.0mm · 0.57mm/px · 3 of 47 slices shown]
[im 1/47]
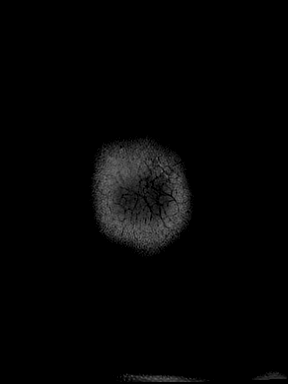
[im 24/47]
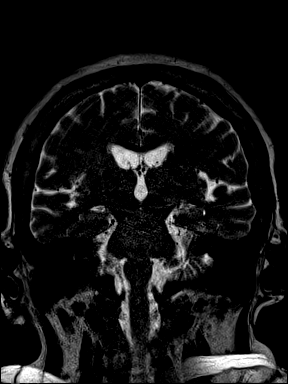
[im 47/47]
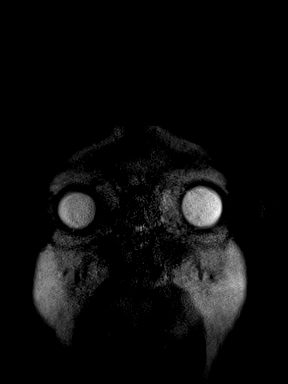

[48 of 48 positions shown; findings below may reference images not displayed]

FINDINGS: Brain: Redemonstrated midline round slightly lobulated enhancing
planum sphenoidale meningioma on series 11, image 68 and series 13,
image 20. On these images the lesion encompasses 15 x 15 x 8
millimeters (AP by transverse by CC), but visually appears unchanged
since 09/12/2018. As before, no definite cerebral edema. There are
regional postoperative changes with encephalomalacia to the
bilateral anterior frontal lobes and inferior frontal gyri.

No other abnormal intracranial enhancement or dural thickening
identified.

No restricted diffusion to suggest acute infarction. No midline
shift, ventriculomegaly, or acute intracranial hemorrhage.
Cervicomedullary junction and pituitary are within normal limits.

Stable T2 heterogeneity in the pons. Stable chronic blood products
along the anterior frontal lobes.

Vascular: Major intracranial vascular flow voids are stable.

Skull and upper cervical spine: Sequelae of midline anterior
craniotomy. Visualized bone marrow signal is within normal limits.
Cervical disc and endplate degeneration redemonstrated.

Sinuses/Orbits: Stable and negative.

Other: Mastoids remain clear. Visible internal auditory structures
appear normal. Stable scalp soft tissues.
IMPRESSION: 1. Study for stereotactic radio surgery planning demonstrating
stable recurrent midline planum sphenoidale meningioma, on these
images encompassing 15 x 15 x 8 mm.
2. No new intracranial abnormality. Chronic bifrontal postoperative
changes and nonspecific T2 signal changes in the pons.

## 2020-05-03 NOTE — Progress Notes (Signed)
Virtual Visit via Telephone Note  I connected with [name] on 05/03/20 at 4:05 PM EST  by telephone and verified that I am speaking with the correct person using two identifiers.  Location: Patient: Home Provider: [place] office    I discussed the limitations, risks, security and privacy concerns of performing an evaluation and management service by telephone and the availability of in person appointments. I also discussed with the patient that there may be a patient responsible charge related to this service. The patient expressed understanding and agreed to proceed.   I provided 45 minutes of non-face-to-face time during this encounter.   [name], LCSW             THERAPIST PROGRESS NOTE    Session Time: Monday 05/03/2020  4:05 PM - 4:50 PM     Participation Level: Active  Behavioral Response:  Less depressed, anxious, talkative, alert   Type of Therapy: Individual Therapy  Treatment Goals addressed: Patient wants to learn how to improve coping skills to manage chronic pain and health issues/ improve mood    Interventions: CBT and Supportive   Summary: [name] is a 70 y.o. female who presents with  long standing history of recurrent periods  of depression beginning when she was [age] and her favorite uncle died. Patient reports multiple psychiatric hospitlaizations due to depression and suicidal ideations with the last one occuring in 1997. Patient has participated in outpatient psychotherapy and medication management intermittently since age 70. She currently is seeing psychiatrist Dr. Harrington Challenger . Prior to this, she was seen at [facility]. Patient also has had ECT at [facility]. Symptoms have worsened in recent months due to family stress and have  included depressed mood, anxiety, excessive worry, and tearfulness.         Patient's last contact was by virtual visit via telephone about 2-3  weeks ago.  She reports minimal symptoms of depression but  continued stress and anxiety.  She states feeling jittery on the anxiety since dosage of Ativan has been reduced.  Per her report, she is having difficulty sleeping because she wakes up in the night worrying.  She also reports some of her sleep difficulty may be related to needing a new CPAP machine.  She expects to have a new one delivered to her home soon.  Patient also reports increased irritability.  She reports that she has been using relaxation activities such as listening to music as well as listening to inspirational sermons on her iPad.  She says she has been practicing imagery about 3-4 times per week and says this is helpful.  She is pleased that she recently learned that the tumor in her brain has decreased.  She reports she continues to use helpful strategies to try to cope with pain.      Suicidal/Homicidal: Nowithout intent/plan  Therapist Response: Reviewed symptoms, praised and reinforced patient's efforts to practice imagery, discussed effects, assisted patient try to identify and prioritize her worries, discussed the role of sleep hygiene regarding coping depression and anxiety, developed plan with patient to write a list of her worries for the day just prior to going to bed to try to avoid waking up with worries during the night, also encouraged patient to continue practicing relaxation techniques   Plan: Return again in 2 weeks.  Diagnosis: Axis I: MDD, Recurrent, moderate    Axis II: Deferred    Abrar Koone E Faron Whitelock, LCSW

## 2020-05-04 ENCOUNTER — Telehealth: Payer: Self-pay

## 2020-05-04 ENCOUNTER — Other Ambulatory Visit: Payer: Self-pay

## 2020-05-04 ENCOUNTER — Inpatient Hospital Stay (HOSPITAL_COMMUNITY): Payer: Medicare HMO | Attending: Hematology

## 2020-05-04 DIAGNOSIS — D509 Iron deficiency anemia, unspecified: Secondary | ICD-10-CM | POA: Insufficient documentation

## 2020-05-04 DIAGNOSIS — L299 Pruritus, unspecified: Secondary | ICD-10-CM

## 2020-05-04 DIAGNOSIS — D500 Iron deficiency anemia secondary to blood loss (chronic): Secondary | ICD-10-CM

## 2020-05-04 LAB — CBC WITH DIFFERENTIAL/PLATELET
Abs Immature Granulocytes: 0.06 10*3/uL (ref 0.00–0.07)
Basophils Absolute: 0 10*3/uL (ref 0.0–0.1)
Basophils Relative: 1 %
Eosinophils Absolute: 0.2 10*3/uL (ref 0.0–0.5)
Eosinophils Relative: 2 %
HCT: 39.4 % (ref 36.0–46.0)
Hemoglobin: 11.2 g/dL — ABNORMAL LOW (ref 12.0–15.0)
Immature Granulocytes: 1 %
Lymphocytes Relative: 21 %
Lymphs Abs: 1.8 10*3/uL (ref 0.7–4.0)
MCH: 23 pg — ABNORMAL LOW (ref 26.0–34.0)
MCHC: 28.4 g/dL — ABNORMAL LOW (ref 30.0–36.0)
MCV: 80.7 fL (ref 80.0–100.0)
Monocytes Absolute: 0.9 10*3/uL (ref 0.1–1.0)
Monocytes Relative: 10 %
Neutro Abs: 5.6 10*3/uL (ref 1.7–7.7)
Neutrophils Relative %: 65 %
Platelets: 238 10*3/uL (ref 150–400)
RBC: 4.88 MIL/uL (ref 3.87–5.11)
RDW: 14 % (ref 11.5–15.5)
WBC: 8.6 10*3/uL (ref 4.0–10.5)
nRBC: 0 % (ref 0.0–0.2)

## 2020-05-04 LAB — IRON AND TIBC
Iron: 56 ug/dL (ref 28–170)
Saturation Ratios: 16 % (ref 10.4–31.8)
TIBC: 342 ug/dL (ref 250–450)
UIBC: 286 ug/dL

## 2020-05-04 LAB — COMPREHENSIVE METABOLIC PANEL
ALT: 23 U/L (ref 0–44)
AST: 22 U/L (ref 15–41)
Albumin: 4.3 g/dL (ref 3.5–5.0)
Alkaline Phosphatase: 71 U/L (ref 38–126)
Anion gap: 8 (ref 5–15)
BUN: 28 mg/dL — ABNORMAL HIGH (ref 8–23)
CO2: 28 mmol/L (ref 22–32)
Calcium: 9.8 mg/dL (ref 8.9–10.3)
Chloride: 103 mmol/L (ref 98–111)
Creatinine, Ser: 1.13 mg/dL — ABNORMAL HIGH (ref 0.44–1.00)
GFR, Estimated: 53 mL/min — ABNORMAL LOW (ref >60)
Glucose, Bld: 90 mg/dL (ref 70–99)
Potassium: 4.3 mmol/L (ref 3.5–5.1)
Sodium: 139 mmol/L (ref 135–145)
Total Bilirubin: 0.5 mg/dL (ref 0.3–1.2)
Total Protein: 7.4 g/dL (ref 6.5–8.1)

## 2020-05-04 LAB — FERRITIN: Ferritin: 987 ng/mL — ABNORMAL HIGH (ref 11–307)

## 2020-05-04 LAB — LACTATE DEHYDROGENASE: LDH: 223 U/L — ABNORMAL HIGH (ref 98–192)

## 2020-05-04 MED ORDER — FAMOTIDINE 40 MG PO TABS: 40.0000 mg | ORAL_TABLET | Freq: Two times a day (BID) | ORAL | 0 refills | Status: DC

## 2020-05-04 MED ORDER — DIPHENHYDRAMINE HCL 50 MG PO TABS: 50.0000 mg | ORAL_TABLET | Freq: Two times a day (BID) | ORAL | 0 refills | Status: DC | PRN

## 2020-05-04 MED ORDER — FAMOTIDINE 20 MG PO TABS: 20.0000 mg | ORAL_TABLET | Freq: Two times a day (BID) | ORAL | 0 refills | Status: DC

## 2020-05-04 NOTE — Telephone Encounter (Signed)
I sent in pepcid and benadryl and referred her to allergist to determine what is causing the issue.

## 2020-05-04 NOTE — Telephone Encounter (Signed)
Pt states she has tried so many things to calm the itching and she is unable to come in right now, states there is no rash to see she is just itching all over.

## 2020-05-04 NOTE — Telephone Encounter (Signed)
STOP hydroxyzine. Can she come in for a visit? (I don't know how big the rash is, is it blistered like shingles, does it itch?)

## 2020-05-04 NOTE — Telephone Encounter (Signed)
Patient calling she states she now is broke out in a rash on her back since she last saw Pearline Cables is there anything over the counter we can recommend ? P# [phone]

## 2020-05-04 NOTE — Telephone Encounter (Signed)
Pt states the area does itch, it is global and in scalp. It is mainly under the skin, skin is not raised or red.

## 2020-05-04 NOTE — Telephone Encounter (Signed)
Please advise 

## 2020-05-04 NOTE — Addendum Note (Signed)
Addended by: Demetrius Revel on: 05/04/2020 04:44 PM   Modules accepted: Orders

## 2020-05-05 DIAGNOSIS — F250 Schizoaffective disorder, bipolar type: Secondary | ICD-10-CM | POA: Diagnosis not present

## 2020-05-05 DIAGNOSIS — F17210 Nicotine dependence, cigarettes, uncomplicated: Secondary | ICD-10-CM | POA: Diagnosis not present

## 2020-05-05 DIAGNOSIS — J449 Chronic obstructive pulmonary disease, unspecified: Secondary | ICD-10-CM | POA: Diagnosis not present

## 2020-05-05 DIAGNOSIS — E1142 Type 2 diabetes mellitus with diabetic polyneuropathy: Secondary | ICD-10-CM | POA: Diagnosis not present

## 2020-05-05 DIAGNOSIS — E1165 Type 2 diabetes mellitus with hyperglycemia: Secondary | ICD-10-CM | POA: Diagnosis not present

## 2020-05-05 DIAGNOSIS — R2681 Unsteadiness on feet: Secondary | ICD-10-CM | POA: Diagnosis not present

## 2020-05-05 DIAGNOSIS — E039 Hypothyroidism, unspecified: Secondary | ICD-10-CM | POA: Diagnosis not present

## 2020-05-05 DIAGNOSIS — J42 Unspecified chronic bronchitis: Secondary | ICD-10-CM | POA: Diagnosis not present

## 2020-05-05 DIAGNOSIS — M160 Bilateral primary osteoarthritis of hip: Secondary | ICD-10-CM | POA: Diagnosis not present

## 2020-05-05 NOTE — Telephone Encounter (Signed)
Pt informed

## 2020-05-10 ENCOUNTER — Other Ambulatory Visit: Payer: Self-pay

## 2020-05-10 ENCOUNTER — Telehealth: Payer: Self-pay | Admitting: Internal Medicine

## 2020-05-10 DIAGNOSIS — R2681 Unsteadiness on feet: Secondary | ICD-10-CM | POA: Diagnosis not present

## 2020-05-10 DIAGNOSIS — E1165 Type 2 diabetes mellitus with hyperglycemia: Secondary | ICD-10-CM | POA: Diagnosis not present

## 2020-05-10 DIAGNOSIS — J449 Chronic obstructive pulmonary disease, unspecified: Secondary | ICD-10-CM | POA: Diagnosis not present

## 2020-05-10 DIAGNOSIS — E1142 Type 2 diabetes mellitus with diabetic polyneuropathy: Secondary | ICD-10-CM | POA: Diagnosis not present

## 2020-05-10 DIAGNOSIS — F250 Schizoaffective disorder, bipolar type: Secondary | ICD-10-CM | POA: Diagnosis not present

## 2020-05-10 DIAGNOSIS — J42 Unspecified chronic bronchitis: Secondary | ICD-10-CM | POA: Diagnosis not present

## 2020-05-10 DIAGNOSIS — F17210 Nicotine dependence, cigarettes, uncomplicated: Secondary | ICD-10-CM | POA: Diagnosis not present

## 2020-05-10 DIAGNOSIS — M160 Bilateral primary osteoarthritis of hip: Secondary | ICD-10-CM | POA: Diagnosis not present

## 2020-05-10 DIAGNOSIS — E039 Hypothyroidism, unspecified: Secondary | ICD-10-CM | POA: Diagnosis not present

## 2020-05-10 MED ORDER — NYSTATIN 100000 UNIT/GM EX POWD: CUTANEOUS | 1 refills | Status: DC

## 2020-05-10 NOTE — Telephone Encounter (Signed)
I don't have anything on her. What are we looking for and how can we help her?

## 2020-05-10 NOTE — Telephone Encounter (Signed)
Mandi can you check to see if [place] has any forms from [employer] for her cpap machine?  Thanks

## 2020-05-10 NOTE — Telephone Encounter (Signed)
[name] have you received any paperwork on this patient?

## 2020-05-11 ENCOUNTER — Inpatient Hospital Stay (HOSPITAL_COMMUNITY): Payer: Medicare HMO | Attending: Hematology | Admitting: Hematology

## 2020-05-11 ENCOUNTER — Other Ambulatory Visit: Payer: Self-pay

## 2020-05-11 ENCOUNTER — Encounter (HOSPITAL_COMMUNITY): Payer: Self-pay | Admitting: Hematology

## 2020-05-11 VITALS — BP 135/54 | HR 68 | Temp 97.2°F | Resp 20 | Wt 172.2 lb

## 2020-05-11 DIAGNOSIS — F319 Bipolar disorder, unspecified: Secondary | ICD-10-CM | POA: Insufficient documentation

## 2020-05-11 DIAGNOSIS — Z8673 Personal history of transient ischemic attack (TIA), and cerebral infarction without residual deficits: Secondary | ICD-10-CM | POA: Diagnosis not present

## 2020-05-11 DIAGNOSIS — R110 Nausea: Secondary | ICD-10-CM | POA: Insufficient documentation

## 2020-05-11 DIAGNOSIS — M129 Arthropathy, unspecified: Secondary | ICD-10-CM | POA: Diagnosis not present

## 2020-05-11 DIAGNOSIS — K449 Diaphragmatic hernia without obstruction or gangrene: Secondary | ICD-10-CM | POA: Insufficient documentation

## 2020-05-11 DIAGNOSIS — J449 Chronic obstructive pulmonary disease, unspecified: Secondary | ICD-10-CM | POA: Diagnosis not present

## 2020-05-11 DIAGNOSIS — M160 Bilateral primary osteoarthritis of hip: Secondary | ICD-10-CM | POA: Diagnosis not present

## 2020-05-11 DIAGNOSIS — M810 Age-related osteoporosis without current pathological fracture: Secondary | ICD-10-CM | POA: Insufficient documentation

## 2020-05-11 DIAGNOSIS — D509 Iron deficiency anemia, unspecified: Secondary | ICD-10-CM | POA: Diagnosis not present

## 2020-05-11 DIAGNOSIS — R0602 Shortness of breath: Secondary | ICD-10-CM | POA: Diagnosis not present

## 2020-05-11 DIAGNOSIS — R2681 Unsteadiness on feet: Secondary | ICD-10-CM | POA: Diagnosis not present

## 2020-05-11 DIAGNOSIS — Z79899 Other long term (current) drug therapy: Secondary | ICD-10-CM | POA: Diagnosis not present

## 2020-05-11 DIAGNOSIS — E119 Type 2 diabetes mellitus without complications: Secondary | ICD-10-CM | POA: Insufficient documentation

## 2020-05-11 DIAGNOSIS — F250 Schizoaffective disorder, bipolar type: Secondary | ICD-10-CM | POA: Diagnosis not present

## 2020-05-11 DIAGNOSIS — F17210 Nicotine dependence, cigarettes, uncomplicated: Secondary | ICD-10-CM | POA: Insufficient documentation

## 2020-05-11 DIAGNOSIS — E039 Hypothyroidism, unspecified: Secondary | ICD-10-CM | POA: Diagnosis not present

## 2020-05-11 DIAGNOSIS — G40909 Epilepsy, unspecified, not intractable, without status epilepticus: Secondary | ICD-10-CM | POA: Insufficient documentation

## 2020-05-11 DIAGNOSIS — K5900 Constipation, unspecified: Secondary | ICD-10-CM | POA: Diagnosis not present

## 2020-05-11 DIAGNOSIS — I10 Essential (primary) hypertension: Secondary | ICD-10-CM | POA: Diagnosis not present

## 2020-05-11 DIAGNOSIS — F331 Major depressive disorder, recurrent, moderate: Secondary | ICD-10-CM | POA: Diagnosis not present

## 2020-05-11 DIAGNOSIS — E1142 Type 2 diabetes mellitus with diabetic polyneuropathy: Secondary | ICD-10-CM | POA: Diagnosis not present

## 2020-05-11 DIAGNOSIS — D649 Anemia, unspecified: Secondary | ICD-10-CM | POA: Diagnosis not present

## 2020-05-11 DIAGNOSIS — M791 Myalgia, unspecified site: Secondary | ICD-10-CM | POA: Insufficient documentation

## 2020-05-11 DIAGNOSIS — K219 Gastro-esophageal reflux disease without esophagitis: Secondary | ICD-10-CM | POA: Diagnosis not present

## 2020-05-11 DIAGNOSIS — J42 Unspecified chronic bronchitis: Secondary | ICD-10-CM | POA: Diagnosis not present

## 2020-05-11 NOTE — Telephone Encounter (Signed)
ATC patient to let her know that we have not received any paperwork for her as of yet and for her to call the office back if she has a phone number for [name] to call or if she can reach back out to them to let them know. LMTCB

## 2020-05-11 NOTE — Patient Instructions (Signed)
[place] at [facility] Discharge Instructions  You were seen today by Dr. Delton Coombes. He went over your recent results. If your energy levels drop, call the office to arrange an iron infusion. Your next appointment will be with the physician assistant in 4 months for labs and follow up.   Thank you for choosing [place] at [facility] to provide your oncology and hematology care.  To afford each patient quality time with our provider, please arrive at least 15 minutes before your scheduled appointment time.   If you have a lab appointment with the [facility] please come in thru the Main Entrance and check in at the main information desk  You need to re-schedule your appointment should you arrive 10 or more minutes late.  We strive to give you quality time with our providers, and arriving late affects you and other patients whose appointments are after yours.  Also, if you no show three or more times for appointments you may be dismissed from the clinic at the providers discretion.     Again, thank you for choosing [facility].  Our hope is that these requests will decrease the amount of time that you wait before being seen by our physicians.       _____________________________________________________________  Should you have questions after your visit to [facility], please contact our office at [phone] between the hours of 8:00 a.m. and 4:30 p.m.  Voicemails left after 4:00 p.m. will not be returned until the following business day.  For prescription refill requests, have your pharmacy contact our office and allow 72 hours.    Cancer Center Support Programs:   > Cancer Support Group  2nd Tuesday of the month 1pm-2pm, Journey Room

## 2020-05-11 NOTE — Progress Notes (Signed)
[name], [name]   CLINIC:  Medical Oncology/Hematology  PCP:  [name] Helper, MD [address], [address] / [place] [address]  [phone]  REASON FOR VISIT:  Follow-up for microcytic anemia  PRIOR THERAPY: Iron tablets  CURRENT THERAPY: Intermittent Feraheme last on 02/19/2020  INTERVAL HISTORY:  [name], a 70 y.o. female, returns for routine follow-up for her microcytic anemia. [name] was last seen by Faythe Casa, NP, on 01/29/2020.  Today she reports feeling fair. She reports having low energy levels and decreased strength. She also notes having an itching sensation as if something is crawling over her. She fractures her right ankle on 02/20/2020.   REVIEW OF SYSTEMS:  Review of Systems  Constitutional: Positive for appetite change (50%) and fatigue (depleted).  Respiratory: Positive for shortness of breath (w/ exertion).   Gastrointestinal: Positive for nausea.  Musculoskeletal: Positive for arthralgias (7/10 R ankle d/t fracture).  Skin: Positive for itching (crawling sensation).  Neurological: Positive for dizziness, extremity weakness and headaches (pressure).  Psychiatric/Behavioral: Positive for depression and sleep disturbance. The patient is nervous/anxious.   All other systems reviewed and are negative.   PAST MEDICAL/SURGICAL HISTORY:  Past Medical History:  Diagnosis Date  . Allergy   . Anemia   . Anxiety    takes Ativan daily  . Arthritis   . Assistance needed for mobility   . Bipolar disorder ([place])    takes Risperdal nightly  . Blood transfusion   . Brain tumor ([place])   . Cancer ([place])    In her gum  . Carpal tunnel syndrome of right wrist 05/23/2011  . Cervical disc disorder with radiculopathy of cervical region 10/31/2012  . Chronic back pain   . Chronic idiopathic constipation   . Chronic neck and back pain   . Colon polyps   . COPD (chronic obstructive pulmonary disease)  with chronic bronchitis ([place]) 09/16/2013   Office Spirometry 10/30/2013-submaximal effort based on appearance of loop and curve. Numbers would fit with severe restriction but her physiologic capability may be better than this. FVC 0.91/44%, and 10.74/45%, FEV1/FVC 0.81, FEF 25-75% 1.43/69%    . Diabetes mellitus    Type II  . Diverticulosis    TCS 9/08 by Dr. Delfin Edis for diarrhea . Bx for micro scopic colitis negative.   . Fibromyalgia   . Frequent falls   . GERD (gastroesophageal reflux disease)    takes Aciphex daily  . Glaucoma    eye drops daily  . Gum symptoms    infection on antibiotic  . Hemiplegia affecting non-dominant side, post-stroke 08/02/2011  . Hiatal hernia   . Hyperlipidemia    takes Crestor daily  . Hypertension    takes Amlodipine,Metoprolol,and Clonidine daily  . Hypothyroidism    takes Synthroid daily  . IBS (irritable bowel syndrome)   . Insomnia    takes Trazodone nightly  . Major depression, recurrent ([place])    takes Zoloft daily  . 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 Metabolic encephalopathy 5/68/1275  . Migraines    chronic headaches  . Mononeuritis lower limb   . Narcolepsy   . Osteoporosis   . Pancreatitis 2006   due to Depakote with normal EUS   . Schatzki's ring    non critical / EGD with ED 8/2011with RMR  . Seizures ([place])    takes Lamictal daily.Last seizure 3 yrs ago  . Sleep apnea    on CPAP  .  Small bowel obstruction ([place])   . Stroke [facility])    left sided weakness, speech changes  . Tubular adenoma of colon    Past Surgical History:  Procedure Laterality Date  . ABDOMINAL HYSTERECTOMY  1978  . BACK SURGERY  July 2012  . BACTERIAL OVERGROWTH TEST N/A 05/05/2013   Procedure: BACTERIAL OVERGROWTH TEST;  Surgeon: Daneil Dolin, MD;  [facility];  Service: Endoscopy;  Laterality: N/A;  7:30  . BIOPSY THYROID  2009  . BRAIN SURGERY  11/2011   resection of meningioma  . BREAST REDUCTION SURGERY  1994  . CARDIAC  CATHETERIZATION  05/10/2005   normal coronaries, normal LV systolic function and EF (Dr. Jackie Plum)  . CARPAL TUNNEL RELEASE Left 07/22/04   Dr. Aline Brochure  . CATARACT EXTRACTION Bilateral   . CHOLECYSTECTOMY  1984  . COLONOSCOPY N/A 09/25/2012   [id number] diverticulosis.  colonic polyp-removed : tubular adenoma  . CRANIOTOMY  11/23/2011   Procedure: CRANIOTOMY TUMOR EXCISION;  Surgeon: Hosie Spangle, MD;  Location: [place];  Service: Neurosurgery;  Laterality: N/A;  Craniotomy for tumor resection  . ESOPHAGOGASTRODUODENOSCOPY  12/29/2010   [place]k-Retained food in the esophagus and stomach, small hiatal hernia, status post Maloney dilation of the esophagus  . ESOPHAGOGASTRODUODENOSCOPY N/A 09/25/2012   VZD:GLOVFIEP atonic baggy esophagus status post Maloney dilation 75 F. Hiatal hernia  . GIVENS CAPSULE STUDY N/A 01/15/2013   NORMAL.   . IR GENERIC HISTORICAL  03/17/2016   IR RADIOLOGIST EVAL & MGMT 03/17/2016 [facility]INTER[facility] RAD  . LESION REMOVAL N/A 05/31/2015   Procedure: REMOVAL RIGHT AND LEFT LESIONS OF MANDIBLE;  Surgeon: Diona Browner, DDS;  Location: [place];  Service: Oral Surgery;  Laterality: N/A;  . MALONEY DILATION  12/29/2010   RMR;  . NM MYOCAR PERF WALL MOTION  2006   "relavtiely normal" persantine, mild anterior thinning (breast attenuation artifact), no region of scar/ischemia  . OVARIAN CYST REMOVAL    . RECTOCELE REPAIR N/A 06/29/2015   Procedure: POSTERIOR REPAIR (RECTOCELE);  Surgeon: Jonnie Kind, MD;  Location: [facility];  Service: Gynecology;  Laterality: N/A;  . REDUCTION MAMMAPLASTY Bilateral   . SPINE SURGERY  09/29/2010   Dr. Rolena Infante  . surgical excision of 3 tumors from right thigh and right buttock  and left upper thigh  2010  . TOOTH EXTRACTION Bilateral 12/14/2014   Procedure: REMOVAL OF BILATERAL MANDIBULAR EXOSTOSES;  Surgeon: Diona Browner, DDS;  Location: [facility];  Service: Oral Surgery;  Laterality: Bilateral;  . TRANSTHORACIC ECHOCARDIOGRAM  2010   EF 60-65%,  mild conc LVH, grade 1 diastolic dysfunction; mildly calcified MV annulus with mildly thickened leaflets, mildly calcified MR annulus    SOCIAL HISTORY:  Social History   Socioeconomic History  . Marital status: Divorced    Spouse name: Not on file  . Number of children: 1  . Years of education: 68  . Highest education level: High school graduate  Occupational History  . Occupation: Disabled  Tobacco Use  . Smoking status: Current Every Day Smoker    Packs/day: 0.50    Years: 30.00    Pack years: 15.00    Types: Cigarettes  . Smokeless tobacco: Never Used  . Tobacco comment: 5-10 cigs a day  Vaping Use  . Vaping Use: Never used  Substance and Sexual Activity  . Alcohol use: No    Alcohol/week: 0.0 standard drinks    Comment:    . Drug use: No  . Sexual activity: Not Currently  Other Topics Concern  . Not on file  Social History Narrative   01/29/18 Lives alone, has 3 aides, Mon- Fri 8 hrs, 2 hrs on Sat-Sun, RN manages her meds   Caffeine use: Drink coffee sometimes    Right handed    Social Determinants of Health   Financial Resource Strain: Low Risk   . Difficulty of Paying Living Expenses: Not very hard  Food Insecurity: No Food Insecurity  . Worried About Charity fundraiser in the Last Year: Never true  . Ran Out of Food in the Last Year: Never true  Transportation Needs: No Transportation Needs  . Lack of Transportation (Medical): No  . Lack of Transportation (Non-Medical): No  Physical Activity: Inactive  . Days of Exercise per Week: 0 days  . Minutes of Exercise per Session: 0 min  Stress: Stress Concern Present  . Feeling of Stress : To some extent  Social Connections: Moderately Isolated  . Frequency of Communication with Friends and Family: More than three times a week  . Frequency of Social Gatherings with Friends and Family: More than three times a week  . Attends Religious Services: More than 4 times per year  . Active Member of Clubs or  Organizations: No  . Attends Archivist Meetings: Never  . Marital Status: Divorced  Human resources officer Violence: Not At Risk  . Fear of Current or Ex-Partner: No  . Emotionally Abused: No  . Physically Abused: No  . Sexually Abused: No    FAMILY HISTORY:  Family History  Problem Relation Age of Onset  . Heart attack Mother        HTN  . Pneumonia Father   . Kidney failure Father   . Diabetes Father   . Pancreatic cancer Sister   . Diabetes Brother   . Hypertension Brother   . Diabetes Brother   . Cancer Sister        breast   . Hypertension Son   . Sleep apnea Son   . Cancer Sister        pancreatic  . Stroke Maternal Grandmother   . Heart attack Maternal Grandfather   . Alcohol abuse Maternal Uncle   . Colon cancer Neg Hx   . Anesthesia problems Neg Hx   . Hypotension Neg Hx   . Malignant hyperthermia Neg Hx   . Pseudochol deficiency Neg Hx   . Breast cancer Neg Hx     CURRENT MEDICATIONS:  Current Outpatient Medications  Medication Sig Dispense Refill  . Accu-Chek Softclix Lancets lancets TEST BLOOD SUGAR THREE TIMES DAILY AS DIRECTED 300 each 2  . albuterol (VENTOLIN HFA) 108 (90 Base) MCG/ACT inhaler INHALE 1 TO 2 PUFFS EVERY 6 HOURS AS NEEDED FOR WHEEZING, SHORTNESS OF BREATH 1 each 3  . Alcohol Swabs (B-D SINGLE USE SWABS REGULAR) PADS USE TO CLEAN FINGER PRIOR TO STICKING FOR BLOOD SUGAR. 100 each 11  . alendronate (FOSAMAX) 70 MG tablet TAKE 1 TABLET (70 MG TOTAL) BY MOUTH EVERY 7 (SEVEN) DAYS. TAKE WITH A FULL GLASS OF WATER ON AN EMPTY STOMACH. 12 tablet 3  . amLODipine (NORVASC) 10 MG tablet TAKE 1 TABLET EVERY DAY 90 tablet 3  . ascorbic acid (VITAMIN C) 500 MG tablet Take 500 mg by mouth daily.    [name] aspirin EC 81 MG tablet Take 1 tablet (81 mg total) by mouth daily with breakfast. 120 tablet 2  . benzonatate (TESSALON) 100 MG capsule Take 1 capsule (100 mg total) by  mouth 2 (two) times daily as needed for cough. 20 capsule 0  . betamethasone  dipropionate 0.05 % cream Apply topically 2 (two) times daily as needed (Rash). 45 g 3  . Blood Glucose Calibration (ACCU-CHEK GUIDE CONTROL) LIQD USE PRN TO CALIBRATE GLUCOMETER 3 each 11  . blood glucose meter kit and supplies Dispense based on patient and insurance preference. Use up to four times daily as directed. (FOR ICD-10 E10.9, E11.9). 1 each 0  . buPROPion (WELLBUTRIN XL) 150 MG 24 hr tablet Take 1 tablet (150 mg total) by mouth every morning. 90 tablet 2  . cetirizine (ZYRTEC) 10 MG tablet Take 1 tablet (10 mg total) by mouth daily. 7 tablet 0  . clobetasol cream (TEMOVATE) 0.63 % Apply 1 application topically 2 (two) times daily.     . Continuous Blood Gluc Sensor (FREESTYLE LIBRE 14 DAY SENSOR) MISC 1 each by Does not apply route every 14 (fourteen) days. Change every 2 weeks 2 each 11  . cromolyn (NASALCROM) 5.2 MG/ACT nasal spray Place 1 spray into both nostrils 4 (four) times daily. 13 mL 1  . diclofenac Sodium (VOLTAREN) 1 % GEL APPLY 2 GRAMS  TOPICALLY 4 (FOUR) TIMES DAILY. 800 g 0  . dicyclomine (BENTYL) 10 MG capsule Take 1 capsule (10 mg total) by mouth 3 (three) times daily as needed for spasms. 90 capsule 2  . diphenhydrAMINE (BENADRYL) 50 MG tablet Take 1 tablet (50 mg total) by mouth 2 (two) times daily as needed for itching. 30 tablet 0  . ezetimibe (ZETIA) 10 MG tablet TAKE 1 TABLET EVERY DAY 90 tablet 1  . famotidine (PEPCID) 20 MG tablet Take 1 tablet (20 mg total) by mouth 2 (two) times daily. 14 tablet 0  . glucose blood (ACCU-CHEK GUIDE) test strip USE TO CHECK BLOOD SUGAR FOUR TIMES A DAY AND PRN 400 each 11  . insulin aspart (NOVOLOG FLEXPEN) 100 UNIT/ML FlexPen Inject 16 to 18 units under skin before meals up to 3 times a day 45 mL 3  . Insulin Pen Needle (SURE COMFORT PEN NEEDLES) 31G X 8 MM MISC USE TO for injecting insulin 4 TIMES DAILY. 200 each 3  . lamoTRIgine (LAMICTAL) 100 MG tablet Take 1 tablet (100 mg total) by mouth 2 (two) times daily. 180 tablet 2  .  levothyroxine (SYNTHROID) 50 MCG tablet TAKE 1 TABLET DAILY, EXCEPT TAKE 1/2 TABLET ON SUNDAY 85 tablet 1  . lidocaine (XYLOCAINE) 2 % solution RINSE WITH 15MLS AND SPIT OUT AFTER 30 SECONDS FOUR TIMES DAILY AS NEEDED FOR PAIN.    [name] LORazepam (ATIVAN) 0.5 MG tablet Take 1 tablet (0.5 mg total) by mouth 2 (two) times daily. 180 tablet 0  . losartan (COZAAR) 50 MG tablet TAKE 1 TABLET EVERY DAY 90 tablet 1  . meclizine (ANTIVERT) 25 MG tablet Take 1 tablet (25 mg total) by mouth 3 (three) times daily as needed for dizziness. 30 tablet 0  . metoprolol tartrate (LOPRESSOR) 50 MG tablet TAKE 1 TABLET TWICE DAILY (NEED MD APPOINTMENT) 180 tablet 3  . montelukast (SINGULAIR) 10 MG tablet TAKE 1 TABLET EVERY DAY 90 tablet 1  . nicotine polacrilex (COMMIT) 4 MG lozenge Take 1 lozenge (4 mg total) by mouth as needed for smoking cessation. 100 tablet 0  . nystatin (MYCOSTATIN/NYSTOP) powder APPLY TO AFFECTED AREA 4 TIMES DAILY. 90 g 1  . Omega-3 Fatty Acids (FISH OIL PO) Take 1 capsule by mouth daily.    [name] oxyCODONE (OXY IR/ROXICODONE) 5 MG  immediate release tablet Take 5 mg by mouth every 6 (six) hours as needed.    [name] oxyCODONE-acetaminophen (PERCOCET) 10-325 MG tablet Take 1 tablet by mouth every 8 (eight) hours as needed.     [name] Plecanatide (TRULANCE) 3 MG TABS Take 3 mg by mouth daily. 30 tablet 3  . pregabalin (LYRICA) 75 MG capsule Take 1 capsule (75 mg total) by mouth daily.    . RABEprazole (ACIPHEX) 20 MG tablet TAKE 1 TABLET TWICE DAILY (NEED MD APPOINTMENT) 180 tablet 0  . risperiDONE (RISPERDAL) 0.5 MG tablet Take 1 tablet (0.5 mg total) by mouth at bedtime. 90 tablet 2  . rosuvastatin (CRESTOR) 5 MG tablet TAKE 1 TABLET AT BEDTIME 90 tablet 2  . sertraline (ZOLOFT) 100 MG tablet Take 1 tablet (100 mg total) by mouth daily. 90 tablet 2  . STIOLTO RESPIMAT 2.5-2.5 MCG/ACT AERS INHALE 2 PUFFS INTO THE LUNGS DAILY. 12 g 3  . TOUJEO MAX SOLOSTAR 300 UNIT/ML Solostar Pen INJECT 24 UNITS SUBCUTANEOUSLY  INTO THE SKIN AT BEDTIME 12 mL 3  . traZODone (DESYREL) 100 MG tablet Take 1 tablet (100 mg total) by mouth at bedtime. 90 tablet 2  . triamcinolone (KENALOG) 0.1 % Apply 1 application topically daily. 15 g 1  . UNABLE TO [facility] bed mattress x 1  DX: G47.33, J44.9 1 each 0  . UNABLE TO FIND Standard wheelchair Dx M47.16, R29.898 1 each 0   No current facility-administered medications for this visit.    ALLERGIES:  Allergies  Allergen Reactions  . Lac Bovis Rash    Doesn't agree with stomach.   . Phenazopyridine Hcl Hives  . Cephalexin Hives  . Flonase [Fluticasone]     "It gave me ulcers in my nose"  . Iron Nausea And Vomiting  . Milk-Related Compounds Other (See Comments)    Doesn't agree with stomach.   . Penicillins Hives    Has patient had a PCN reaction causing immediate rash, facial/tongue/throat swelling, SOB or lightheadedness with hypotension: Yes Has patient had a PCN reaction causing severe rash involving mucus membranes or skin necrosis: No Has patient had a PCN reaction that required hospitalization No Has patient had a PCN reaction occurring within the last 10 years: No If all of the above answers are "NO", then may proceed with Cephalosporin use.   [name] Phenazopyridine Hcl Hives          PHYSICAL EXAM:  Performance status (ECOG): 1 - Symptomatic but completely ambulatory  Vitals:   05/11/20 1503  BP: (!) 135/54  Pulse: 68  Resp: 20  Temp: (!) 97.2 F (36.2 C)  SpO2: 100%   Wt Readings from Last 3 Encounters:  05/11/20 172 lb 3.2 oz (78.1 kg)  04/28/20 171 lb (77.6 kg)  04/27/20 170 lb (77.1 kg)   Physical Exam Vitals reviewed.  Constitutional:      Appearance: Normal appearance. She is obese.     Comments: Using rollator  Cardiovascular:     Rate and Rhythm: Normal rate and regular rhythm.     Pulses: Normal pulses.     Heart sounds: Normal heart sounds.  Pulmonary:     Effort: Pulmonary effort is normal.     Breath sounds: Normal  breath sounds.  Musculoskeletal:     Right lower leg: No edema.     Left lower leg: No edema.  Neurological:     General: No focal deficit present.     Mental Status: She is alert and oriented to  person, place, and time.  Psychiatric:        Mood and Affect: Mood normal.        Behavior: Behavior normal.     LABORATORY DATA:  I have reviewed the labs as listed.  CBC Latest Ref Rng & Units 05/04/2020 02/20/2020 01/28/2020  WBC 4.0 - 10.5 K/uL 8.6 9.1 6.1  Hemoglobin 12.0 - 15.0 g/dL 11.2(L) 10.2(L) 10.9(L)  Hematocrit 36.0 - 46.0 % 39.4 36.4 38.8  Platelets 150 - 400 K/uL 238 223 234   CMP Latest Ref Rng & Units 05/04/2020 02/20/2020 01/28/2020  Glucose 70 - 99 mg/dL 90 108(H) 85  BUN 8 - 23 mg/dL 28(H) 32(H) 30(H)  Creatinine 0.44 - 1.00 mg/dL 1.13(H) 1.50(H) 1.09(H)  Sodium 135 - 145 mmol/L 139 140 141  Potassium 3.5 - 5.1 mmol/L 4.3 4.3 4.1  Chloride 98 - 111 mmol/L 103 108 108  CO2 22 - 32 mmol/L _0 Calcium 8.9 - 10.3 mg/dL 9.8 8.6(L) 9.3  Total Protein 6.5 - 8.1 g/dL 7.4 7.0 7.1  Total Bilirubin 0.3 - 1.2 mg/dL 0.5 0.6 0.5  Alkaline Phos 38 - 126 U/L 71 88 91  AST 15 - 41 U/L _1 ALT 0 - 44 U/L _2 Component Value Date/Time   RBC 4.88 05/04/2020 1222   MCV 80.7 05/04/2020 1222   MCV 75 (L) 01/29/2018 1315   MCH 23.0 (L) 05/04/2020 1222   MCHC 28.4 (L) 05/04/2020 1222   RDW 14.0 05/04/2020 1222   RDW 12.8 01/29/2018 1315   LYMPHSABS 1.8 05/04/2020 1222   LYMPHSABS 2.4 01/29/2018 1315   MONOABS 0.9 05/04/2020 1222   EOSABS 0.2 05/04/2020 1222   EOSABS 0.2 01/29/2018 1315   BASOSABS 0.0 05/04/2020 1222   BASOSABS 0.0 01/29/2018 1315   Lab Results  Component Value Date   LDH 223 (H) 05/04/2020   LDH 172 01/28/2020   LDH 168 10/23/2019   Lab Results  Component Value Date   TIBC 342 05/04/2020   TIBC 330 01/28/2020   TIBC 309 10/23/2019   FERRITIN 987 (H) 05/04/2020   FERRITIN 355 (H) 01/28/2020   FERRITIN 562 (H) 10/23/2019    IRONPCTSAT 16 05/04/2020   IRONPCTSAT 19 01/28/2020   IRONPCTSAT 30 10/23/2019    DIAGNOSTIC IMAGING:  I have independently reviewed the scans and discussed with the patient. MR Brain W Wo Contrast  Result Date: 04/22/2020 CLINICAL DATA:  Brain/CNS neoplasm, surveillance. Follow-up of treated meningioma. EXAM: MRI HEAD WITHOUT AND WITH CONTRAST TECHNIQUE: Multiplanar, multiecho pulse sequences of the brain and surrounding structures were obtained without and with intravenous contrast. CONTRAST:  21m MULTIHANCE GADOBENATE DIMEGLUMINE 529 MG/ML IV SOLN COMPARISON:  MRI of the brain April 18, 2019. FINDINGS: Brain: No acute infarction, hemorrhage, hydrocephalus or extra-axial collection. Posttreatment changes from frontal approach meningioma resection with encephalomalacia and gliosis in the bilateral inferior frontal lobes. Interval decrease in size of the residual enhancing tumor in the planum sphenoidale, eccentric to the right, now measuring 11 x 8 mm as compared to 11 x 14 mm on prior. No new focus of abnormal contrast enhancement identified. Stable T2 hyperintensity within the pons, nonspecific. Vascular: Normal flow voids. Skull and upper cervical spine: Postsurgical changes from bifrontal craniotomy. No focal marrow lesion identified. Sinuses/Orbits: Negative. IMPRESSION: 1. Interval decrease in size of the residual enhancing tumor in the planum sphenoidale, now measuring 11 x 8 mm as compared to 11 x 14 mm on prior.  2. No new focus of abnormal contrast enhancement identified. Electronically Signed   By: Pedro Earls M.D.   On: 04/22/2020 12:50     ASSESSMENT:  1.  Microcytic anemia: -This is a combination anemia from iron deficiency and chronic kidney disease. -Colonoscopy on 02/10/2016 showing 2 sessile polyps in the descending colon, diverticula in the sigmoid colon. -EGD done on 02/10/2016 showing mild to moderate esophagitis, erythema in the inferior gastric antrum. -  She was placed on iron pills, but could not tolerate them due to constipation. -Last Feraheme on 02/12/2020 on 02/19/2020.   PLAN:  1.  Microcytic anemia: -She denies any bleeding per rectum or melena. -Reviewed labs from 05/04/2020.  Ferritin is 97 and percent saturation is 16.  Hemoglobin is 11.2 and MCV is 80.7.  Rest of the CBC was normal. -She does not feel any excessive fatigue.  Chronic right ankle pain from fracture is stable. -No indication for parenteral iron therapy at this time.  RTC 4 months with repeat labs.   Orders placed this encounter:  Orders Placed This Encounter  Procedures  . CBC with Differential/Platelet  . Ferritin  . Iron and TIBC     Derek Jack, MD [place] [phone]   I, Milinda Antis, am acting as a scribe for Dr. Sanda Linger.  I, Derek Jack MD, have reviewed the above documentation for accuracy and completeness, and I agree with the above.

## 2020-05-12 NOTE — Telephone Encounter (Signed)
Spoke with pt and made aware we had unsuccessful attempt at reaching [facility], but will try again later. Pt verbalized understanding.

## 2020-05-12 NOTE — Telephone Encounter (Signed)
Pt seems very confused about cpap machine. Pt states when she answers the phone it hangs up. Pt called insurance and they said she could get a new machine and needed the dr to notify them/order for the cpap.Pt has been in touch with [employer]. Pt seems very confused on what to do.Please advise [phone]

## 2020-05-12 NOTE — Telephone Encounter (Signed)
Attempted to contact patient x2, patient not able to hear me. Switched phones, patient still unable to hear me. Attempted to contact a third time and had to leave a message.   Call made to [facility] left on hold for greater than 10 minutes. Will try back.

## 2020-05-13 ENCOUNTER — Other Ambulatory Visit: Payer: Self-pay

## 2020-05-13 ENCOUNTER — Telehealth (HOSPITAL_COMMUNITY): Payer: Medicare HMO | Admitting: Psychiatry

## 2020-05-14 NOTE — Telephone Encounter (Signed)
I can not tell from these msgs exactly what is needed  I called pt to get some clarification  She states that her CPAP machine was recalled and she was advised by Dr Annamaria Boots could use at her own risk  She prefers to have a new machine ordered  Looks like we ordered new one back in Oct 2021 and DME referral states [facility] will have to replace this  [employer] and placed on hold x 10 min- [facility]- [phone]

## 2020-05-17 ENCOUNTER — Other Ambulatory Visit: Payer: Self-pay

## 2020-05-17 ENCOUNTER — Telehealth (INDEPENDENT_AMBULATORY_CARE_PROVIDER_SITE_OTHER): Payer: Medicare HMO | Admitting: Psychiatry

## 2020-05-17 ENCOUNTER — Encounter (HOSPITAL_COMMUNITY): Payer: Self-pay | Admitting: Psychiatry

## 2020-05-17 DIAGNOSIS — J42 Unspecified chronic bronchitis: Secondary | ICD-10-CM | POA: Diagnosis not present

## 2020-05-17 DIAGNOSIS — F250 Schizoaffective disorder, bipolar type: Secondary | ICD-10-CM | POA: Diagnosis not present

## 2020-05-17 DIAGNOSIS — F331 Major depressive disorder, recurrent, moderate: Secondary | ICD-10-CM

## 2020-05-17 DIAGNOSIS — E1142 Type 2 diabetes mellitus with diabetic polyneuropathy: Secondary | ICD-10-CM | POA: Diagnosis not present

## 2020-05-17 DIAGNOSIS — E039 Hypothyroidism, unspecified: Secondary | ICD-10-CM | POA: Diagnosis not present

## 2020-05-17 DIAGNOSIS — D649 Anemia, unspecified: Secondary | ICD-10-CM | POA: Diagnosis not present

## 2020-05-17 DIAGNOSIS — M160 Bilateral primary osteoarthritis of hip: Secondary | ICD-10-CM | POA: Diagnosis not present

## 2020-05-17 DIAGNOSIS — J449 Chronic obstructive pulmonary disease, unspecified: Secondary | ICD-10-CM | POA: Diagnosis not present

## 2020-05-17 DIAGNOSIS — R2681 Unsteadiness on feet: Secondary | ICD-10-CM | POA: Diagnosis not present

## 2020-05-17 DIAGNOSIS — F17210 Nicotine dependence, cigarettes, uncomplicated: Secondary | ICD-10-CM | POA: Diagnosis not present

## 2020-05-17 MED ORDER — BUPROPION HCL ER (XL) 150 MG PO TB24
150.0000 mg | ORAL_TABLET | ORAL | 2 refills | Status: DC
Start: 2020-05-17 — End: 2020-07-01

## 2020-05-17 MED ORDER — RISPERIDONE 0.5 MG PO TABS: 0.5000 mg | ORAL_TABLET | Freq: Every day | ORAL | 2 refills | Status: DC

## 2020-05-17 MED ORDER — MONTELUKAST SODIUM 10 MG PO TABS: 10.0000 mg | ORAL_TABLET | Freq: Every day | ORAL | 1 refills | Status: DC

## 2020-05-17 MED ORDER — TRAZODONE HCL 100 MG PO TABS: 100.0000 mg | ORAL_TABLET | Freq: Every day | ORAL | 2 refills | Status: DC

## 2020-05-17 MED ORDER — LORAZEPAM 0.5 MG PO TABS: 0.5000 mg | ORAL_TABLET | Freq: Two times a day (BID) | ORAL | 0 refills | Status: DC

## 2020-05-17 MED ORDER — LOSARTAN POTASSIUM 50 MG PO TABS: 50.0000 mg | ORAL_TABLET | Freq: Every day | ORAL | 1 refills | Status: DC

## 2020-05-17 MED ORDER — LAMOTRIGINE 100 MG PO TABS
100.0000 mg | ORAL_TABLET | Freq: Two times a day (BID) | ORAL | 2 refills | Status: DC
Start: 2020-05-17 — End: 2020-07-01

## 2020-05-17 MED ORDER — SERTRALINE HCL 100 MG PO TABS: 100.0000 mg | ORAL_TABLET | Freq: Every day | ORAL | 2 refills | Status: DC

## 2020-05-17 NOTE — Progress Notes (Signed)
Virtual Visit via Telephone Note  I connected with [name] on 05/17/20 at  1:40 PM EST by telephone and verified that I am speaking with the correct person using two identifiers.  Location: Patient: home Provider: home   I discussed the limitations, risks, security and privacy concerns of performing an evaluation and management service by telephone and the availability of in person appointments. I also discussed with the patient that there may be a patient responsible charge related to this service. The patient expressed understanding and agreed to proceed.    I discussed the assessment and treatment plan with the patient. The patient was provided an opportunity to ask questions and all were answered. The patient agreed with the plan and demonstrated an understanding of the instructions.   The patient was advised to call back or seek an in-person evaluation if the symptoms worsen or if the condition fails to improve as anticipated.  I provided 15 minutes of non-face-to-face time during this encounter.   Levonne Spiller, MD  Virtual Visit via Telephone Note  I connected with [name] on 05/17/20 at  1:40 PM EST by telephone and verified that I am speaking with the correct person using two identifiers.  Location: Patient: home Provider: home   I discussed the limitations, risks, security and privacy concerns of performing an evaluation and management service by telephone and the availability of in person appointments. I also discussed with the patient that there may be a patient responsible charge related to this service. The patient expressed understanding and agreed to proceed.    I discussed the assessment and treatment plan with the patient. The patient was provided an opportunity to ask questions and all were answered. The patient agreed with the plan and demonstrated an understanding of the instructions.   The patient was advised to call back or seek an in-person evaluation  if the symptoms worsen or if the condition fails to improve as anticipated.  I provided 15 minutes of non-face-to-face time during this encounter.   Levonne Spiller, MD  [facility] MD/PA/NP OP Progress Note  05/17/2020 2:06 PM [name]  MRN:  [id number]  Chief Complaint:  Chief Complaint    Anxiety; Depression; Follow-up     HPI: This patient is a 70 year old divorced black female who lives alone in [place].  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  Patient returns for follow-up after 4 weeks.  I am not sure why she is following up so soon but she is upset about her last visit to the emergency room in December.  At that time she was extremely lethargic and her drug screen was positive for opiates and benzodiazepines.  Because of this I cut down her Ativan to only twice a day.  She still seems upset about this but I explained that I am not blaming her just trying to keep her safe.  She is still getting medication through Dr. Nelva Bush and pain management for chronic pain.  She states that her legs and back are still giving her a lot of pain and I urged her to address this with him.  Otherwise she states her mood is fairly stable and she denies suicidal ideation Visit Diagnosis:    ICD-10-CM   1. Major depressive disorder, recurrent episode, moderate (HCC)  F33.1     Past Psychiatric History: Numerous hospitalizations for depression years ago including ECT treatment  Past Medical History:  Past Medical History:  Diagnosis  Date  . Allergy   . Anemia   . Anxiety    takes Ativan daily  . Arthritis   . Assistance needed for mobility   . Bipolar disorder ([place])    takes Risperdal nightly  . Blood transfusion   . Brain tumor ([place])   . Cancer ([place])    In her gum  . Carpal tunnel syndrome of right wrist 05/23/2011  . Cervical disc disorder with radiculopathy of cervical region 10/31/2012  . Chronic back pain   . Chronic idiopathic constipation   . Chronic neck  and back pain   . Colon polyps   . COPD (chronic obstructive pulmonary disease) with chronic bronchitis ([place]) 09/16/2013   Office Spirometry 10/30/2013-submaximal effort based on appearance of loop and curve. Numbers would fit with severe restriction but her physiologic capability may be better than this. FVC 0.91/44%, and 10.74/45%, FEV1/FVC 0.81, FEF 25-75% 1.43/69%    . Diabetes mellitus    Type II  . Diverticulosis    TCS 9/08 by Dr. Delfin Edis for diarrhea . Bx for micro scopic colitis negative.   . Fibromyalgia   . Frequent falls   . GERD (gastroesophageal reflux disease)    takes Aciphex daily  . Glaucoma    eye drops daily  . Gum symptoms    infection on antibiotic  . Hemiplegia affecting non-dominant side, post-stroke 08/02/2011  . Hiatal hernia   . Hyperlipidemia    takes Crestor daily  . Hypertension    takes Amlodipine,Metoprolol,and Clonidine daily  . Hypothyroidism    takes Synthroid daily  . IBS (irritable bowel syndrome)   . Insomnia    takes Trazodone nightly  . Major depression, recurrent ([place])    takes Zoloft daily  . 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 Metabolic encephalopathy 0/09/6224  . Migraines    chronic headaches  . Mononeuritis lower limb   . Narcolepsy   . Osteoporosis   . Pancreatitis 2006   due to Depakote with normal EUS   . Schatzki's ring    non critical / EGD with ED 8/2011with RMR  . Seizures ([place])    takes Lamictal daily.Last seizure 3 yrs ago  . Sleep apnea    on CPAP  . Small bowel obstruction ([place])   . Stroke [facility])    left sided weakness, speech changes  . Tubular adenoma of colon     Past Surgical History:  Procedure Laterality Date  . ABDOMINAL HYSTERECTOMY  1978  . BACK SURGERY  July 2012  . BACTERIAL OVERGROWTH TEST N/A 05/05/2013   Procedure: BACTERIAL OVERGROWTH TEST;  Surgeon: Daneil Dolin, MD;  [facility];  Service: Endoscopy;  Laterality: N/A;  7:30  . BIOPSY THYROID  2009  . BRAIN SURGERY  11/2011    resection of meningioma  . BREAST REDUCTION SURGERY  1994  . CARDIAC CATHETERIZATION  05/10/2005   normal coronaries, normal LV systolic function and EF (Dr. Jackie Plum)  . CARPAL TUNNEL RELEASE Left 07/22/04   Dr. Aline Brochure  . CATARACT EXTRACTION Bilateral   . CHOLECYSTECTOMY  1984  . COLONOSCOPY N/A 09/25/2012   JFH:[id number] diverticulosis.  colonic polyp-removed : tubular adenoma  . CRANIOTOMY  11/23/2011   Procedure: CRANIOTOMY TUMOR EXCISION;  Surgeon: Hosie Spangle, MD;  Location: [place] NEURO ORS;  Service: Neurosurgery;  Laterality: N/A;  Craniotomy for tumor resection  . ESOPHAGOGASTRODUODENOSCOPY  12/29/2010   Rourk-Retained food in the esophagus and stomach, small hiatal hernia, status post Maloney dilation of the esophagus  .  ESOPHAGOGASTRODUODENOSCOPY N/A 09/25/2012   WIO:[id number] atonic baggy esophagus status post Maloney dilation 27 F. Hiatal hernia  . GIVENS CAPSULE STUDY N/A 01/15/2013   NORMAL.   . IR GENERIC HISTORICAL  03/17/2016   IR RADIOLOGIST EVAL & MGMT 03/17/2016 [facility]-INTERV RAD  . LESION REMOVAL N/A 05/31/2015   Procedure: REMOVAL RIGHT AND LEFT LESIONS OF MANDIBLE;  Surgeon: Diona Browner, DDS;  Location: [place];  Service: Oral Surgery;  Laterality: N/A;  . MALONEY DILATION  12/29/2010   RMR;  . NM MYOCAR PERF WALL MOTION  2006   "relavtiely normal" persantine, mild anterior thinning (breast attenuation artifact), no region of scar/ischemia  . OVARIAN CYST REMOVAL    . RECTOCELE REPAIR N/A 06/29/2015   Procedure: POSTERIOR REPAIR (RECTOCELE);  Surgeon: Jonnie Kind, MD;  Location: [facility]S;  Service: Gynecology;  Laterality: N/A;  . REDUCTION MAMMAPLASTY Bilateral   . SPINE SURGERY  09/29/2010   Dr. Rolena Infante  . surgical excision of 3 tumors from right thigh and right buttock  and left upper thigh  2010  . TOOTH EXTRACTION Bilateral 12/14/2014   Procedure: REMOVAL OF BILATERAL MANDIBULAR EXOSTOSES;  Surgeon: Diona Browner, DDS;  Location: [place];  Service: Oral Surgery;   Laterality: Bilateral;  . TRANSTHORACIC ECHOCARDIOGRAM  2010   EF 60-65%, mild conc LVH, grade 1 diastolic dysfunction; mildly calcified MV annulus with mildly thickened leaflets, mildly calcified MR annulus    Family Psychiatric History: See below  Family History:  Family History  Problem Relation Age of Onset  . Heart attack Mother        HTN  . Pneumonia Father   . Kidney failure Father   . Diabetes Father   . Pancreatic cancer Sister   . Diabetes Brother   . Hypertension Brother   . Diabetes Brother   . Cancer Sister        breast   . Hypertension Son   . Sleep apnea Son   . Cancer Sister        pancreatic  . Stroke Maternal Grandmother   . Heart attack Maternal Grandfather   . Alcohol abuse Maternal Uncle   . Colon cancer Neg Hx   . Anesthesia problems Neg Hx   . Hypotension Neg Hx   . Malignant hyperthermia Neg Hx   . Pseudochol deficiency Neg Hx   . Breast cancer Neg Hx     Social History:  Social History   Socioeconomic History  . Marital status: Divorced    Spouse name: Not on file  . Number of children: 1  . Years of education: 75  . Highest education level: High school graduate  Occupational History  . Occupation: Disabled  Tobacco Use  . Smoking status: Current Every Day Smoker    Packs/day: 0.50    Years: 30.00    Pack years: 15.00    Types: Cigarettes  . Smokeless tobacco: Never Used  . Tobacco comment: 5-10 cigs a day  Vaping Use  . Vaping Use: Never used  Substance and Sexual Activity  . Alcohol use: No    Alcohol/week: 0.0 standard drinks    Comment:    . Drug use: No  . Sexual activity: Not Currently  Other Topics Concern  . Not on file  Social History Narrative   01/29/18 Lives alone, has 3 aides, Mon- Fri 8 hrs, 2 hrs on Sat-Sun, RN manages her meds   Caffeine use: Drink coffee sometimes    Right handed    Social Determinants of  Health   Financial Resource Strain: Low Risk   . Difficulty of Paying Living Expenses: Not very  hard  Food Insecurity: No Food Insecurity  . Worried About Charity fundraiser in the Last Year: Never true  . Ran Out of Food in the Last Year: Never true  Transportation Needs: No Transportation Needs  . Lack of Transportation (Medical): No  . Lack of Transportation (Non-Medical): No  Physical Activity: Inactive  . Days of Exercise per Week: 0 days  . Minutes of Exercise per Session: 0 min  Stress: Stress Concern Present  . Feeling of Stress : To some extent  Social Connections: Moderately Isolated  . Frequency of Communication with Friends and Family: More than three times a week  . Frequency of Social Gatherings with Friends and Family: More than three times a week  . Attends Religious Services: More than 4 times per year  . Active Member of Clubs or Organizations: No  . Attends Archivist Meetings: Never  . Marital Status: Divorced    Allergies:  Allergies  Allergen Reactions  . Lac Bovis Rash    Doesn't agree with stomach.   . Phenazopyridine Hcl Hives  . Cephalexin Hives  . Flonase [Fluticasone]     "It gave me ulcers in my nose"  . Iron Nausea And Vomiting  . Milk-Related Compounds Other (See Comments)    Doesn't agree with stomach.   . Penicillins Hives    Has patient had a PCN reaction causing immediate rash, facial/tongue/throat swelling, SOB or lightheadedness with hypotension: Yes Has patient had a PCN reaction causing severe rash involving mucus membranes or skin necrosis: No Has patient had a PCN reaction that required hospitalization No Has patient had a PCN reaction occurring within the last 10 years: No If all of the above answers are "NO", then may proceed with Cephalosporin use.   [name] Phenazopyridine Hcl Hives          Metabolic Disorder Labs: Lab Results  Component Value Date   HGBA1C 5.3 03/25/2020   MPG 108 09/30/2018   MPG 134 10/15/2016   No results found for: PROLACTIN Lab Results  Component Value Date   CHOL 125 09/23/2019    TRIG 117 09/23/2019   HDL 45 09/23/2019   CHOLHDL 3.5 09/30/2018   VLDL 26 07/19/2015   LDLCALC 59 09/23/2019   LDLCALC 106 (H) 02/12/2019   Lab Results  Component Value Date   TSH 0.631 12/11/2019   TSH 0.94 02/17/2019    Therapeutic Level Labs: Lab Results  Component Value Date   LITHIUM <0.06 (L) 10/15/2016   Lab Results  Component Value Date   VALPROATE <10.0 (L) 08/23/2007   No components found for:  CBMZ  Current Medications: Current Outpatient Medications  Medication Sig Dispense Refill  . Accu-Chek Softclix Lancets lancets TEST BLOOD SUGAR THREE TIMES DAILY AS DIRECTED 300 each 2  . albuterol (VENTOLIN HFA) 108 (90 Base) MCG/ACT inhaler INHALE 1 TO 2 PUFFS EVERY 6 HOURS AS NEEDED FOR WHEEZING, SHORTNESS OF BREATH 1 each 3  . Alcohol Swabs (B-D SINGLE USE SWABS REGULAR) PADS USE TO CLEAN FINGER PRIOR TO STICKING FOR BLOOD SUGAR. 100 each 11  . alendronate (FOSAMAX) 70 MG tablet TAKE 1 TABLET (70 MG TOTAL) BY MOUTH EVERY 7 (SEVEN) DAYS. TAKE WITH A FULL GLASS OF WATER ON AN EMPTY STOMACH. 12 tablet 3  . amLODipine (NORVASC) 10 MG tablet TAKE 1 TABLET EVERY DAY 90 tablet 3  . ascorbic acid (VITAMIN  C) 500 MG tablet Take 500 mg by mouth daily.    [name] aspirin EC 81 MG tablet Take 1 tablet (81 mg total) by mouth daily with breakfast. 120 tablet 2  . benzonatate (TESSALON) 100 MG capsule Take 1 capsule (100 mg total) by mouth 2 (two) times daily as needed for cough. 20 capsule 0  . betamethasone dipropionate 0.05 % cream Apply topically 2 (two) times daily as needed (Rash). 45 g 3  . Blood Glucose Calibration (ACCU-CHEK GUIDE CONTROL) LIQD USE PRN TO CALIBRATE GLUCOMETER 3 each 11  . blood glucose meter kit and supplies Dispense based on patient and insurance preference. Use up to four times daily as directed. (FOR ICD-10 E10.9, E11.9). 1 each 0  . buPROPion (WELLBUTRIN XL) 150 MG 24 hr tablet Take 1 tablet (150 mg total) by mouth every morning. 90 tablet 2  . cetirizine  (ZYRTEC) 10 MG tablet Take 1 tablet (10 mg total) by mouth daily. 7 tablet 0  . clobetasol cream (TEMOVATE) 5.17 % Apply 1 application topically 2 (two) times daily.     . Continuous Blood Gluc Sensor (FREESTYLE LIBRE 14 DAY SENSOR) MISC 1 each by Does not apply route every 14 (fourteen) days. Change every 2 weeks 2 each 11  . cromolyn (NASALCROM) 5.2 MG/ACT nasal spray Place 1 spray into both nostrils 4 (four) times daily. 13 mL 1  . diclofenac Sodium (VOLTAREN) 1 % GEL APPLY 2 GRAMS  TOPICALLY 4 (FOUR) TIMES DAILY. 800 g 0  . dicyclomine (BENTYL) 10 MG capsule Take 1 capsule (10 mg total) by mouth 3 (three) times daily as needed for spasms. 90 capsule 2  . diphenhydrAMINE (BENADRYL) 50 MG tablet Take 1 tablet (50 mg total) by mouth 2 (two) times daily as needed for itching. 30 tablet 0  . ezetimibe (ZETIA) 10 MG tablet TAKE 1 TABLET EVERY DAY 90 tablet 1  . famotidine (PEPCID) 20 MG tablet Take 1 tablet (20 mg total) by mouth 2 (two) times daily. 14 tablet 0  . glucose blood (ACCU-CHEK GUIDE) test strip USE TO CHECK BLOOD SUGAR FOUR TIMES A DAY AND PRN 400 each 11  . insulin aspart (NOVOLOG FLEXPEN) 100 UNIT/ML FlexPen Inject 16 to 18 units under skin before meals up to 3 times a day 45 mL 3  . Insulin Pen Needle (SURE COMFORT PEN NEEDLES) 31G X 8 MM MISC USE TO for injecting insulin 4 TIMES DAILY. 200 each 3  . lamoTRIgine (LAMICTAL) 100 MG tablet Take 1 tablet (100 mg total) by mouth 2 (two) times daily. 180 tablet 2  . levothyroxine (SYNTHROID) 50 MCG tablet TAKE 1 TABLET DAILY, EXCEPT TAKE 1/2 TABLET ON SUNDAY 85 tablet 1  . lidocaine (XYLOCAINE) 2 % solution RINSE WITH 15MLS AND SPIT OUT AFTER 30 SECONDS FOUR TIMES DAILY AS NEEDED FOR PAIN.    [name] LORazepam (ATIVAN) 0.5 MG tablet Take 1 tablet (0.5 mg total) by mouth 2 (two) times daily. 180 tablet 0  . losartan (COZAAR) 50 MG tablet Take 1 tablet (50 mg total) by mouth daily. 90 tablet 1  . meclizine (ANTIVERT) 25 MG tablet Take 1 tablet (25  mg total) by mouth 3 (three) times daily as needed for dizziness. 30 tablet 0  . metoprolol tartrate (LOPRESSOR) 50 MG tablet TAKE 1 TABLET TWICE DAILY (NEED MD APPOINTMENT) 180 tablet 3  . montelukast (SINGULAIR) 10 MG tablet Take 1 tablet (10 mg total) by mouth daily. 90 tablet 1  . nicotine polacrilex (COMMIT) 4 MG  lozenge Take 1 lozenge (4 mg total) by mouth as needed for smoking cessation. 100 tablet 0  . nystatin (MYCOSTATIN/NYSTOP) powder APPLY TO AFFECTED AREA 4 TIMES DAILY. 90 g 1  . Omega-3 Fatty Acids (FISH OIL PO) Take 1 capsule by mouth daily.    [name] oxyCODONE (OXY IR/ROXICODONE) 5 MG immediate release tablet Take 5 mg by mouth every 6 (six) hours as needed.    [name] oxyCODONE-acetaminophen (PERCOCET) 10-325 MG tablet Take 1 tablet by mouth every 8 (eight) hours as needed.     [name] Plecanatide (TRULANCE) 3 MG TABS Take 3 mg by mouth daily. 30 tablet 3  . pregabalin (LYRICA) 75 MG capsule Take 1 capsule (75 mg total) by mouth daily.    . RABEprazole (ACIPHEX) 20 MG tablet TAKE 1 TABLET TWICE DAILY (NEED MD APPOINTMENT) 180 tablet 0  . risperiDONE (RISPERDAL) 0.5 MG tablet Take 1 tablet (0.5 mg total) by mouth at bedtime. 90 tablet 2  . rosuvastatin (CRESTOR) 5 MG tablet TAKE 1 TABLET AT BEDTIME 90 tablet 2  . sertraline (ZOLOFT) 100 MG tablet Take 1 tablet (100 mg total) by mouth daily. 90 tablet 2  . STIOLTO RESPIMAT 2.5-2.5 MCG/ACT AERS INHALE 2 PUFFS INTO THE LUNGS DAILY. 12 g 3  . TOUJEO MAX SOLOSTAR 300 UNIT/ML Solostar Pen INJECT 24 UNITS SUBCUTANEOUSLY INTO THE SKIN AT BEDTIME 12 mL 3  . traZODone (DESYREL) 100 MG tablet Take 1 tablet (100 mg total) by mouth at bedtime. 90 tablet 2  . triamcinolone (KENALOG) 0.1 % Apply 1 application topically daily. 15 g 1  . UNABLE TO [facility] bed mattress x 1  DX: G47.33, J44.9 1 each 0  . UNABLE TO FIND Standard wheelchair Dx M47.16, R29.898 1 each 0   No current facility-administered medications for this visit.      Musculoskeletal: Strength & Muscle Tone: decreased Gait & Station: unsteady Patient leans: N/A  Psychiatric Specialty Exam: Review of Systems  Musculoskeletal: Positive for arthralgias, back pain and gait problem.  Psychiatric/Behavioral: The patient is nervous/anxious.   All other systems reviewed and are negative.   There were no vitals taken for this visit.There is no height or weight on file to calculate BMI.  General Appearance: NA  Eye Contact:  NA  Speech:  Clear and Coherent  Volume:  Normal  Mood:  Anxious and Irritable  Affect:  NA  Thought Process:  Goal Directed  Orientation:  Full (Time, Place, and Person)  Thought Content: Rumination   Suicidal Thoughts:  No  Homicidal Thoughts:  No  Memory:  Immediate;   Good Recent;   Good Remote;   Fair  Judgement:  Fair  Insight:  Shallow  Psychomotor Activity:  Decreased  Concentration:  Concentration: Fair and Attention Span: Fair  Recall:  [employer] of Knowledge: Fair  Language: Good  Akathisia:  No  Handed:  Right  AIMS (if indicated): not done  Assets:  Communication Skills Desire for Improvement Resilience Social Support  ADL's:  Intact  Cognition: WNL  Sleep:  Fair   Screenings: Mini-Mental   Flowsheet Row Office Visit from 02/03/2019 in [facility] Office Visit from 09/08/2015 in [facility] [place] Visit from 07/02/2014 in [facility] [place] Visit from 12/08/2013 in [facility] [place]  Total Score (max 30 points ) _0 PHQ2-9   Flowsheet Row Video Visit from 05/17/2020 in [place] Office Visit from 04/28/2020 in [facility] Video  Visit from 04/20/2020 in [facility] Video Visit from 03/09/2020 in [facility] Video Visit from 02/19/2020 in [facility]  PHQ-2 Total Score 1 1 0 0 0    Flowsheet Row Video Visit from 05/17/2020 in [place] Error: Question 6 not populated       Assessment and Plan: This patient is a 70 year old female with a history of depression and anxiety.  Because of her recent emergency room visit which she was very lethargic I have cut down her Ativan and she is not too happy about this.  I have explained that this is for her own safety.  Since her mood has been stable we will continue Wellbutrin XL 150 mg daily for depression, Zoloft 100 mg daily for depression, trazodone 100 mg at bedtime for sleep Lamictal 100 mg twice daily for mood stabilization and Ativan 0.5 mg twice a day for anxiety.  She will return to see me in 3 months   Levonne Spiller, MD 05/17/2020, 2:06 PM

## 2020-05-18 ENCOUNTER — Other Ambulatory Visit: Payer: Self-pay

## 2020-05-18 ENCOUNTER — Ambulatory Visit (INDEPENDENT_AMBULATORY_CARE_PROVIDER_SITE_OTHER): Payer: Medicare HMO | Admitting: Psychiatry

## 2020-05-18 DIAGNOSIS — F331 Major depressive disorder, recurrent, moderate: Secondary | ICD-10-CM | POA: Diagnosis not present

## 2020-05-18 NOTE — Telephone Encounter (Addendum)
Called [facility] and was informed that we need to contact the [employer] division at [phone]. Called [employer]ironics and was spoke to rep. She states all pts will get their replacment CPAP by the end of 2022 and she can access a patient portal at [email or web address] (best used with Writer). When pt is accessing the pt portal she will need to use the CPAP serial number or her replacement confirmation number.   Called and spoke to pt. Informed her of the information and gave her the website and the phone number for respironics. While speak with pt on the phone she sounded dyspneic and wheezy. Pt states she has had a difficult time with her breathing for months. Pt was prescribed a zpak in December and states it didn't help much . Pt was seen in 09/2019 and advised to follow up in 03/2020 but hasnt. Offered an appt this week but pt states she has to give 3 days notice for transportation. Appt made with CY on 05/24/2020 at 1130 in RNA slot. Pt aware to seek emergency care if any new or worsening symptoms.   Will send to [name] to give [facility] and assure appt is ok to keep. Thanks.

## 2020-05-18 NOTE — Telephone Encounter (Signed)
Thanks for your help. That appointment time is ok.

## 2020-05-18 NOTE — Progress Notes (Signed)
Virtual Visit via Telephone Note  I connected with Gabriel Paulding Hadsall on 05/18/20 at 3:15 PM EST  by telephone and verified that I am speaking with the correct person using two identifiers.  Location: Patient: Home Provider: [place] office    I discussed the limitations, risks, security and privacy concerns of performing an evaluation and management service by telephone and the availability of in person appointments. I also discussed with the patient that there may be a patient responsible charge related to this service. The patient expressed understanding and agreed to proceed.   I provided 42 minutes of non-face-to-face time during this encounter.   [name], LCSW             THERAPIST PROGRESS NOTE    Session Time: Tuesday  3/82022  3:15 PM - 3:57 PM     Participation Level: Active  Behavioral Response:  Less depressed, anxious, talkative, alert   Type of Therapy: Individual Therapy  Treatment Goals addressed: Patient wants to learn how to improve coping skills to manage chronic pain and health issues/ improve mood    Interventions: CBT and Supportive   Summary: Amarissa Koerner Geyer is a 70 y.o. female who presents with  long standing history of recurrent periods  of depression beginning when she was [age] and her favorite uncle died. Patient reports multiple psychiatric hospitlaizations due to depression and suicidal ideations with the last one occuring in 1997. Patient has participated in outpatient psychotherapy and medication management intermittently since age 52. She currently is seeing psychiatrist Dr. Harrington Challenger . Prior to this, she was seen at [facility]. Patient also has had ECT at [facility]. Symptoms have worsened in recent months due to family stress and have  included depressed mood, anxiety, excessive worry, and tearfulness.         Patient's last contact was by virtual visit via telephone about 2-3  weeks ago.  She reports minimal symptoms of depression but  continued stress and anxiety as well as feelings of loneliness.  She expresses frustration her son and granddaughter do not visit her more frequently.  Currently they visit her about twice per month.  She has resumed attending church and reports enjoying church.  Continues to try to use helpful strategies to cope with pain.  She reports she is taking 1 oxycodone per day but expresses guilt about this as she feels like she is doing something wrong per her report.  She reports continued sleep difficulty and reports worries from the day.  She reports she did not implement plan of writing a list of worries for the day just prior to going to bed.  She reports sometimes now waking up from bad dreams about deceased loved ones.    Suicidal/Homicidal: Nowithout intent/plan  Therapist Response: Reviewed symptoms, praised and reinforced patient's efforts to resume attendance at church, discussed effects, assisted patient identify and replace thoughts evoking inappropriate guilt with more helpful thoughts, reviewed connection between sleep hygiene/depression/pain, reviewed rationale for writing list of worries for the day just prior to going to bed, developed plan with patient to write list of worries, encourage patient to continue practicing relaxation techniques  Plan: Return again in 2 weeks.  Diagnosis: Axis I: MDD, Recurrent, moderate    Axis II: Deferred    Cherree Conerly E Cheryln Balcom, LCSW

## 2020-05-18 NOTE — Addendum Note (Signed)
Addended by: Maurice Small E on: 05/18/2020 04:05 PM   Modules accepted: Level of Service

## 2020-05-19 ENCOUNTER — Other Ambulatory Visit: Payer: Self-pay

## 2020-05-19 ENCOUNTER — Other Ambulatory Visit: Payer: Self-pay | Admitting: Family Medicine

## 2020-05-19 MED ORDER — LEVOTHYROXINE SODIUM 50 MCG PO TABS: ORAL_TABLET | ORAL | 1 refills | Status: DC

## 2020-05-20 ENCOUNTER — Other Ambulatory Visit: Payer: Self-pay

## 2020-05-20 ENCOUNTER — Telehealth: Payer: Self-pay | Admitting: Physician Assistant

## 2020-05-20 DIAGNOSIS — S8261XD Displaced fracture of lateral malleolus of right fibula, subsequent encounter for closed fracture with routine healing: Secondary | ICD-10-CM | POA: Diagnosis not present

## 2020-05-20 DIAGNOSIS — S8261XK Displaced fracture of lateral malleolus of right fibula, subsequent encounter for closed fracture with nonunion: Secondary | ICD-10-CM | POA: Diagnosis not present

## 2020-05-20 MED ORDER — EZETIMIBE 10 MG PO TABS: 10.0000 mg | ORAL_TABLET | Freq: Every day | ORAL | 1 refills | Status: DC

## 2020-05-20 NOTE — Telephone Encounter (Signed)
Patient called concerning her US Doppler results from 01/2020. She was concerned that she had no follow up scheduled. I informed patient of the results from Gerrianne Scale, PA-C and also informed patient that Selinda Eon wanted her to follow up in our office in one year.

## 2020-05-20 NOTE — Telephone Encounter (Signed)
New message   Patient called stated she was to have a follow up to ultrasound, that she has a clogged artery ? I scheduled her for April with Selinda Eon but would like a follow up call ?

## 2020-05-21 DIAGNOSIS — M160 Bilateral primary osteoarthritis of hip: Secondary | ICD-10-CM | POA: Diagnosis not present

## 2020-05-21 DIAGNOSIS — J449 Chronic obstructive pulmonary disease, unspecified: Secondary | ICD-10-CM | POA: Diagnosis not present

## 2020-05-21 DIAGNOSIS — E1142 Type 2 diabetes mellitus with diabetic polyneuropathy: Secondary | ICD-10-CM | POA: Diagnosis not present

## 2020-05-21 DIAGNOSIS — D649 Anemia, unspecified: Secondary | ICD-10-CM | POA: Diagnosis not present

## 2020-05-21 DIAGNOSIS — F17210 Nicotine dependence, cigarettes, uncomplicated: Secondary | ICD-10-CM | POA: Diagnosis not present

## 2020-05-21 DIAGNOSIS — F250 Schizoaffective disorder, bipolar type: Secondary | ICD-10-CM | POA: Diagnosis not present

## 2020-05-21 DIAGNOSIS — E039 Hypothyroidism, unspecified: Secondary | ICD-10-CM | POA: Diagnosis not present

## 2020-05-21 DIAGNOSIS — J42 Unspecified chronic bronchitis: Secondary | ICD-10-CM | POA: Diagnosis not present

## 2020-05-21 DIAGNOSIS — R2681 Unsteadiness on feet: Secondary | ICD-10-CM | POA: Diagnosis not present

## 2020-05-22 NOTE — Progress Notes (Deleted)
HPI F Former smoker followed for obstructive sleep apnea with hypersomnia, , R/O'd Lung nodule,Wheezing dyspnea, complicated by hx resection of meningioma. Bipolar, GERD, DM, chronic epistaxis NPSG 3/8/13[name]- mild obstructive sleep apnea, AHI 14 per hour, mostly in REM. Epworth sleepiness score 14/24. Weight 164 pounds. She is living alone, divorced and disabled. Son has OSA. History of meningioma 11/23/2011. No seizure in over 3 years. Does not drive Office Spirometry 10/30/2013-submaximal effort based on appearance of loop and curve. Numbers would fit with severe restriction but her physiologic capability may be better than this. FVC 0.91/44%, FEV1 0.74/45%, FEV1/FVC 0.81, FEF 25-75% 1.43/69%. Office Spirometry 07/27/2015-weak effort but still better than her last previous trial effort. Mild restriction of exhaled volume. FVC 1.35/67%, FEV1 1.27/79%, FEV1/FVC 0.94, FEF 25-75 percent 2.14/107% --------------------------------------------------------------------------------------------------------   09/18/19- 69 yoF smoker followed for obstructive sleep apnea with hypersomnia, , R/O'd Lung nodule,Wheezing dyspnea/ tobacco, complicated by hx resection of meningioma. Bipolar/ Depression, GERD, DM, chronic epistaxis  CPAP 5-15 auto/ [employer] ([employer]) Still smoking 5-10 cigs/ day Download compliance 30%, AHI 11.3/ hr  Used 16 days, not used 14 days Pressure ranges 6.3-9.8 High leak Patient arrived diaphoretic and weak- had not had lunch- Fingerstick CBG 52. Patient was given a cookie and sweet tea. Repeated CBG in 15 minutes: 123. Stiolto, singulair, Ventolin hfa,  Body weight today 166 lbs Orthopedist told her too many medical problems for rotator cuff repair- pain tough to bear. She blames poor mask fit for poor CPAP compliance. Difficult coping. She says DME told her to go online for mask help- that's a big ask for her. Says breathing ok and meds ok. No recent cough or discolored sputum. CT  from Nov reviewed. She would like to quit smoking but doesn't feel she can cope. Agrees to work with Pharmacy team. Hx XRT for meningioma- says now she has another one, being followed.  CT chest 01/16/2019- IMPRESSION: 1. No suspicious pulmonary nodule or mass on today's study. There is some focal chronic atelectasis or scarring in the lingula, not substantially changed since July of 2018. This likely accounts for the nodular opacity seen on the recent chest x-ray. 2.  Aortic Atherosclerois (ICD10-170.0)  05/24/20- 69 yoF smoker followed for obstructive sleep apnea with hypersomnia, , R/O'd Lung nodule,Wheezing dyspnea/ tobacco, complicated by hx resection of meningioma. Bipolar/ Depression, GERD, DM, chronic epistaxis , Microcytic Anemia, CPAP 5-15 auto/ [employer] ([employer]) Still smoking 5-10 cigs/ day Download- Body weight today- Covid vax- Flu vax-   ROS-see HPI + = positive Constitutional:   No-   weight loss, night sweats, fevers, chills, +fatigue, lassitude. HEENT:  + headaches, +difficulty swallowing, tooth/dental problems, sore throat,       No-sneezing, itching, ear ache, nasal congestion, post nasal drip,  CV:  chest pain, orthopnea, PND, swelling in lower extremities, anasarca, dizziness, +palpitations Resp: +  shortness of breath with exertion or at rest.                productive cough,   non-productive cough,  No- coughing up of blood.              No-   change in color of mucus.   wheezing.   Skin: No-   rash or lesions. GI:  +heartburn, +indigestion, no-abdominal pain, nausea, vomiting,  GU:  MS:  + joint pain or swelling.   Neuro-     nothing unusual Psych:  No- change in mood or affect. +depression or +anxiety.  No memory loss.  OBJ-  Physical Exam - + she speaks slowly but seems alert and appropriate,  General-  Oriented, Affect-flat, Distress- none acute Skin- rash-none, lesions- none, excoriation- none Lymphadenopathy- none Head- Eyes- Gross vision intact, PERRLA,  conjunctivae and secretions clear            Ears- Hearing, canals-normal + = positive            Nose- no-Septal dev, polyps, erosion, perforation             Throat- Mallampati III , mucosa clear , drainage- none, tonsils- atrophic,  + dentures Neck- flexible , trachea midline, no stridor , thyroid nl, carotid no bruit Chest - symmetrical excursion , unlabored           Heart/CV- RRR , no murmur , no gallop  , no rub, nl s1 s2                           - JVD- none , edema- none, stasis changes- none, varices- none           Lung- clear to P&A, wheeze- none, cough- mild , dullness-none, rub- none           Chest wall-  Abd-  Br/ Gen/ Rectal- Not done, not indicated Extrem- cyanosis- none, clubbing, none, atrophy- none, strength- nl. +cane Neuro- + seems fully oriented but vague. Speech pattern is slow.

## 2020-05-24 ENCOUNTER — Ambulatory Visit: Payer: Medicare HMO | Admitting: Internal Medicine

## 2020-05-24 ENCOUNTER — Telehealth: Payer: Self-pay | Admitting: Internal Medicine

## 2020-05-24 MED ORDER — RABEPRAZOLE SODIUM 20 MG PO TBEC: DELAYED_RELEASE_TABLET | ORAL | 0 refills | Status: DC

## 2020-05-24 NOTE — Telephone Encounter (Signed)
Rx sent 

## 2020-05-24 NOTE — Telephone Encounter (Signed)
Patient called to request refill on Aciphex also scheduled an OV for 08/10/20.

## 2020-05-25 DIAGNOSIS — F250 Schizoaffective disorder, bipolar type: Secondary | ICD-10-CM | POA: Diagnosis not present

## 2020-05-25 DIAGNOSIS — E039 Hypothyroidism, unspecified: Secondary | ICD-10-CM | POA: Diagnosis not present

## 2020-05-25 DIAGNOSIS — M160 Bilateral primary osteoarthritis of hip: Secondary | ICD-10-CM | POA: Diagnosis not present

## 2020-05-25 DIAGNOSIS — F17210 Nicotine dependence, cigarettes, uncomplicated: Secondary | ICD-10-CM | POA: Diagnosis not present

## 2020-05-25 DIAGNOSIS — J449 Chronic obstructive pulmonary disease, unspecified: Secondary | ICD-10-CM | POA: Diagnosis not present

## 2020-05-25 DIAGNOSIS — E1142 Type 2 diabetes mellitus with diabetic polyneuropathy: Secondary | ICD-10-CM | POA: Diagnosis not present

## 2020-05-25 DIAGNOSIS — D649 Anemia, unspecified: Secondary | ICD-10-CM | POA: Diagnosis not present

## 2020-05-25 DIAGNOSIS — J42 Unspecified chronic bronchitis: Secondary | ICD-10-CM | POA: Diagnosis not present

## 2020-05-25 DIAGNOSIS — R2681 Unsteadiness on feet: Secondary | ICD-10-CM | POA: Diagnosis not present

## 2020-05-26 DIAGNOSIS — R29898 Other symptoms and signs involving the musculoskeletal system: Secondary | ICD-10-CM | POA: Diagnosis not present

## 2020-05-26 DIAGNOSIS — D329 Benign neoplasm of meninges, unspecified: Secondary | ICD-10-CM | POA: Diagnosis not present

## 2020-05-26 DIAGNOSIS — M4716 Other spondylosis with myelopathy, lumbar region: Secondary | ICD-10-CM | POA: Diagnosis not present

## 2020-05-26 LAB — HM DIABETES EYE EXAM

## 2020-05-28 ENCOUNTER — Telehealth: Payer: Self-pay

## 2020-05-28 DIAGNOSIS — Z0100 Encounter for examination of eyes and vision without abnormal findings: Secondary | ICD-10-CM | POA: Diagnosis not present

## 2020-05-28 NOTE — Telephone Encounter (Signed)
Left voicemail to remind pt to bring SD card from CPAP machine to visit on Monday. Nothing further needed.

## 2020-05-31 ENCOUNTER — Other Ambulatory Visit: Payer: Self-pay

## 2020-05-31 ENCOUNTER — Ambulatory Visit (INDEPENDENT_AMBULATORY_CARE_PROVIDER_SITE_OTHER): Payer: Medicare HMO | Admitting: Adult Health

## 2020-05-31 ENCOUNTER — Encounter: Payer: Self-pay | Admitting: Adult Health

## 2020-05-31 ENCOUNTER — Other Ambulatory Visit: Payer: Self-pay | Admitting: Internal Medicine

## 2020-05-31 DIAGNOSIS — J449 Chronic obstructive pulmonary disease, unspecified: Secondary | ICD-10-CM

## 2020-05-31 DIAGNOSIS — J42 Unspecified chronic bronchitis: Secondary | ICD-10-CM | POA: Diagnosis not present

## 2020-05-31 DIAGNOSIS — G4733 Obstructive sleep apnea (adult) (pediatric): Secondary | ICD-10-CM

## 2020-05-31 DIAGNOSIS — F250 Schizoaffective disorder, bipolar type: Secondary | ICD-10-CM | POA: Diagnosis not present

## 2020-05-31 DIAGNOSIS — R2681 Unsteadiness on feet: Secondary | ICD-10-CM | POA: Diagnosis not present

## 2020-05-31 DIAGNOSIS — F17210 Nicotine dependence, cigarettes, uncomplicated: Secondary | ICD-10-CM | POA: Diagnosis not present

## 2020-05-31 DIAGNOSIS — D649 Anemia, unspecified: Secondary | ICD-10-CM | POA: Diagnosis not present

## 2020-05-31 DIAGNOSIS — E1142 Type 2 diabetes mellitus with diabetic polyneuropathy: Secondary | ICD-10-CM | POA: Diagnosis not present

## 2020-05-31 DIAGNOSIS — E039 Hypothyroidism, unspecified: Secondary | ICD-10-CM | POA: Diagnosis not present

## 2020-05-31 DIAGNOSIS — M160 Bilateral primary osteoarthritis of hip: Secondary | ICD-10-CM | POA: Diagnosis not present

## 2020-05-31 NOTE — Addendum Note (Signed)
Addended by: Merrilee Seashore on: 05/31/2020 02:42 PM   Modules accepted: Orders

## 2020-05-31 NOTE — Patient Instructions (Signed)
Continue on Stiolto 2 puffs daily  Work on not smoking  Refer to LDCT screening program .   Order for new CPAP machine  Wear CPAP At bedtime   Work on healthy weight .   Follow up with [name]  In 6 months and As needed

## 2020-05-31 NOTE — Assessment & Plan Note (Signed)
Patient has been off of her CPAP for many months due to the recall.  Will place order for new machine as she has no definitive date on to receive all of her CPAP machine. Begin CPAP 5 to 15 cm H2O.  Patient education on sleep apnea and CPAP usage.

## 2020-05-31 NOTE — Progress Notes (Signed)
_0  ID: [name], female    DOB: [birth year], 70 y.o.   MRN: [id number]  Chief Complaint  Patient presents with  . Follow-up    Referring provider: [name], MD  HPI: 70 year old female active smoker followed for obstructive sleep apnea with hypersomnia and Chronic Bronchitis  Medical history significant for bipolar disorder, hypertension, diabetes  TEST/EVENTS :  CT chest November 20 - for pulmonary nodule, focal atelectasis in the lingula.  Stable since 2018.  2017 Spirometry FEV1 70%, ratio 86, FVC 65%  05/31/2020 Follow up : OSA and Chronic Bronchitis  Patient returns for a follow-up visit.  Last seen July 2021.  Patient has underlying obstructive sleep apnea is on nocturnal CPAP. Says she has not been wearing for several months. Her machine is on the recall list.  May be the end of 2022 till the recall machine is delivered.  Discussed placing new order for new CPAP .  Complains of Daytime sleepiness and fatigue , restless sleep.,   Patient is an active smoker with history of bronchitis.  She is on Stiolto inhaler daily.  Says overall breathing is doing okay , has some intermittent cough and wheezing .  No increased albuterol inhaler .   Smoking cessation discussed . Has tried chantix in past but restarted smoking after 3 months, has tried  Started smoking age 59 till now , on average 1PPD . Quit for 3 months . (49 Pack/year    Allergies  Allergen Reactions  . Lac Bovis Rash    Doesn't agree with stomach.   . Phenazopyridine Hcl Hives  . Cephalexin Hives  . Flonase [Fluticasone]     "It gave me ulcers in my nose"  . Iron Nausea And Vomiting  . Milk-Related Compounds Other (See Comments)    Doesn't agree with stomach.   . Penicillins Hives    Has patient had a PCN reaction causing immediate rash, facial/tongue/throat swelling, SOB or lightheadedness with hypotension: Yes Has patient had a PCN reaction causing severe rash involving mucus membranes or  skin necrosis: No Has patient had a PCN reaction that required hospitalization No Has patient had a PCN reaction occurring within the last 10 years: No If all of the above answers are "NO", then may proceed with Cephalosporin use.   [name] Phenazopyridine Hcl Hives          Immunization History  Administered Date(s) Administered  . Fluad Quad(high Dose 65+) 01/01/2019, 12/11/2019  . H1N1 01/09/2008  . Influenza Split 12/12/2010, 11/16/2011  . Influenza Whole 11/23/2006, 12/10/2007, 12/11/2008, 11/16/2009  . Influenza,inj,Quad PF,6+ Mos 11/28/2012, 11/20/2013, 11/10/2014, 12/06/2015, 11/21/2016, 11/20/2017  . Influenza,inj,quad, With Preservative 12/11/2016, 01/07/2018  . Moderna Sars-Covid-2 Vaccination 04/27/2019, 05/25/2019, 02/16/2020  . Pneumococcal Conjugate-13 09/16/2013  . Pneumococcal Polysaccharide-23 08/03/2003, 12/11/2008, 08/22/2010, 08/31/2015  . Td 09/23/2003  . Tdap 12/29/2011  . Zoster Recombinat (Shingrix) 02/12/2019, 07/03/2019    Past Medical History:  Diagnosis Date  . Allergy   . Anemia   . Anxiety    takes Ativan daily  . Arthritis   . Assistance needed for mobility   . Bipolar disorder ([name])    takes Risperdal nightly  . Blood transfusion   . Brain tumor ([name])   . Cancer ([name])    In her gum  . Carpal tunnel syndrome of right wrist 05/23/2011  . Cervical disc disorder with radiculopathy of cervical region 10/31/2012  . Chronic back pain   . Chronic idiopathic constipation   . Chronic neck and back  pain   . Colon polyps   . COPD (chronic obstructive pulmonary disease) with chronic bronchitis ([name]) 09/16/2013   Office Spirometry 10/30/2013-submaximal effort based on appearance of loop and curve. Numbers would fit with severe restriction but her physiologic capability may be better than this. FVC 0.91/44%, and 10.74/45%, FEV1/FVC 0.81, FEF 25-75% 1.43/69%    . Diabetes mellitus    Type II  . Diverticulosis    TCS 9/08 by Dr. Delfin [name] for diarrhea . Bx  for micro scopic colitis negative.   . Fibromyalgia   . Frequent falls   . GERD (gastroesophageal reflux disease)    takes Aciphex daily  . Glaucoma    eye drops daily  . Gum symptoms    infection on antibiotic  . Hemiplegia affecting non-dominant side, post-stroke 08/02/2011  . Hiatal hernia   . Hyperlipidemia    takes Crestor daily  . Hypertension    takes Amlodipine,Metoprolol,and Clonidine daily  . Hypothyroidism    takes Synthroid daily  . IBS (irritable bowel syndrome)   . Insomnia    takes Trazodone nightly  . Major depression, recurrent ([name])    takes Zoloft daily  . 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 Metabolic encephalopathy 9/62/2297  . Migraines    chronic headaches  . Mononeuritis lower limb   . Narcolepsy   . Osteoporosis   . Pancreatitis 2006   due to Depakote with normal EUS   . Schatzki's ring    non critical / EGD with ED 8/2011with RMR  . Seizures ([place])    takes Lamictal daily.Last seizure 3 yrs ago  . Sleep apnea    on CPAP  . Small bowel obstruction ([place])   . Stroke [facility])    left sided weakness, speech changes  . Tubular adenoma of colon     Tobacco History: Social History   Tobacco Use  Smoking Status Current Every Day Smoker  . Packs/day: 0.50  . Years: 30.00  . Pack years: 15.00  . Types: Cigarettes  Smokeless Tobacco Never Used  Tobacco Comment   smokes 1 pack per day 05/31/2020   Ready to quit: No Counseling given: Yes Comment: smokes 1 pack per day 05/31/2020   Outpatient Medications Prior to Visit  Medication Sig Dispense Refill  . Accu-Chek Softclix Lancets lancets TEST BLOOD SUGAR THREE TIMES DAILY AS DIRECTED 300 each 2  . albuterol (VENTOLIN HFA) 108 (90 Base) MCG/ACT inhaler INHALE 1 TO 2 PUFFS EVERY 6 HOURS AS NEEDED FOR WHEEZING, SHORTNESS OF BREATH 1 each 3  . Alcohol Swabs (B-D SINGLE USE SWABS REGULAR) PADS USE TO CLEAN FINGER PRIOR TO STICKING FOR BLOOD SUGAR. 100 each 11  . alendronate (FOSAMAX) 70 MG tablet TAKE 1  TABLET (70 MG TOTAL) BY MOUTH EVERY 7 (SEVEN) DAYS. TAKE WITH A FULL GLASS OF WATER ON AN EMPTY STOMACH. 12 tablet 3  . amLODipine (NORVASC) 10 MG tablet TAKE 1 TABLET EVERY DAY 90 tablet 3  . ascorbic acid (VITAMIN C) 500 MG tablet Take 500 mg by mouth daily.    [name] aspirin EC 81 MG tablet Take 1 tablet (81 mg total) by mouth daily with breakfast. 120 tablet 2  . betamethasone dipropionate 0.05 % cream Apply topically 2 (two) times daily as needed (Rash). 45 g 3  . Blood Glucose Calibration (ACCU-CHEK GUIDE CONTROL) LIQD USE PRN TO CALIBRATE GLUCOMETER 3 each 11  . blood glucose meter kit and supplies Dispense based on patient and insurance preference. Use up to four times daily as  directed. (FOR ICD-10 E10.9, E11.9). 1 each 0  . buPROPion (WELLBUTRIN XL) 150 MG 24 hr tablet Take 1 tablet (150 mg total) by mouth every morning. 90 tablet 2  . cetirizine (ZYRTEC) 10 MG tablet Take 1 tablet (10 mg total) by mouth daily. 7 tablet 0  . clobetasol cream (TEMOVATE) 3.97 % Apply 1 application topically 2 (two) times daily.     . Continuous Blood Gluc Sensor (FREESTYLE LIBRE 14 DAY SENSOR) MISC 1 each by Does not apply route every 14 (fourteen) days. Change every 2 weeks 2 each 11  . cromolyn (NASALCROM) 5.2 MG/ACT nasal spray Place 1 spray into both nostrils 4 (four) times daily. 13 mL 1  . diclofenac Sodium (VOLTAREN) 1 % GEL APPLY 2 GRAMS  TOPICALLY 4 (FOUR) TIMES DAILY. 800 g 0  . dicyclomine (BENTYL) 10 MG capsule Take 1 capsule (10 mg total) by mouth 3 (three) times daily as needed for spasms. 90 capsule 2  . diphenhydrAMINE (BENADRYL) 50 MG tablet Take 1 tablet (50 mg total) by mouth 2 (two) times daily as needed for itching. 30 tablet 0  . ezetimibe (ZETIA) 10 MG tablet Take 1 tablet (10 mg total) by mouth daily. 90 tablet 1  . famotidine (PEPCID) 20 MG tablet Take 1 tablet (20 mg total) by mouth 2 (two) times daily. 14 tablet 0  . glucose blood (ACCU-CHEK GUIDE) test strip USE TO CHECK BLOOD SUGAR  FOUR TIMES A DAY AND PRN 400 each 11  . insulin aspart (NOVOLOG FLEXPEN) 100 UNIT/ML FlexPen Inject 16 to 18 units under skin before meals up to 3 times a day 45 mL 3  . Insulin Pen Needle (SURE COMFORT PEN NEEDLES) 31G X 8 MM MISC USE TO for injecting insulin 4 TIMES DAILY. 200 each 3  . lamoTRIgine (LAMICTAL) 100 MG tablet Take 1 tablet (100 mg total) by mouth 2 (two) times daily. 180 tablet 2  . levothyroxine (SYNTHROID) 50 MCG tablet TAKE 1 TABLET DAILY, EXCEPT TAKE 1/2 TABLET ON SUNDAY 85 tablet 1  . lidocaine (XYLOCAINE) 2 % solution RINSE WITH 15MLS AND SPIT OUT AFTER 30 SECONDS FOUR TIMES DAILY AS NEEDED FOR PAIN.    [name] LORazepam (ATIVAN) 0.5 MG tablet Take 1 tablet (0.5 mg total) by mouth 2 (two) times daily. 180 tablet 0  . losartan (COZAAR) 50 MG tablet Take 1 tablet (50 mg total) by mouth daily. 90 tablet 1  . meclizine (ANTIVERT) 25 MG tablet Take 1 tablet (25 mg total) by mouth 3 (three) times daily as needed for dizziness. 30 tablet 0  . metoprolol tartrate (LOPRESSOR) 50 MG tablet TAKE 1 TABLET TWICE DAILY (NEED MD APPOINTMENT) 180 tablet 3  . montelukast (SINGULAIR) 10 MG tablet Take 1 tablet (10 mg total) by mouth daily. 90 tablet 1  . nicotine polacrilex (COMMIT) 4 MG lozenge Take 1 lozenge (4 mg total) by mouth as needed for smoking cessation. 100 tablet 0  . nystatin (MYCOSTATIN/NYSTOP) powder APPLY TO AFFECTED AREA 4 TIMES DAILY. 90 g 1  . Omega-3 Fatty Acids (FISH OIL PO) Take 1 capsule by mouth daily.    [name] oxyCODONE (OXY IR/ROXICODONE) 5 MG immediate release tablet Take 5 mg by mouth every 6 (six) hours as needed.    [name] Plecanatide (TRULANCE) 3 MG TABS Take 3 mg by mouth daily. 30 tablet 3  . pregabalin (LYRICA) 75 MG capsule Take 1 capsule (75 mg total) by mouth daily.    . RABEprazole (ACIPHEX) 20 MG tablet TAKE 1  TABLET TWICE DAILY (MUST KEEP 07/2020 APPT FOR FURTHER REFILLS) 180 tablet 0  . risperiDONE (RISPERDAL) 0.5 MG tablet Take 1 tablet (0.5 mg total) by mouth at  bedtime. 90 tablet 2  . rosuvastatin (CRESTOR) 5 MG tablet TAKE 1 TABLET AT BEDTIME 90 tablet 2  . sertraline (ZOLOFT) 100 MG tablet Take 1 tablet (100 mg total) by mouth daily. 90 tablet 2  . STIOLTO RESPIMAT 2.5-2.5 MCG/ACT AERS INHALE 2 PUFFS INTO THE LUNGS DAILY. 12 g 3  . TOUJEO MAX SOLOSTAR 300 UNIT/ML Solostar Pen INJECT 24 UNITS SUBCUTANEOUSLY INTO THE SKIN AT BEDTIME 12 mL 3  . traZODone (DESYREL) 100 MG tablet Take 1 tablet (100 mg total) by mouth at bedtime. 90 tablet 2  . triamcinolone (KENALOG) 0.1 % Apply 1 application topically daily. 15 g 1  . UNABLE TO [facility] bed mattress x 1  DX: G47.33, J44.9 1 each 0  . UNABLE TO FIND Standard wheelchair Dx M47.16, R29.898 1 each 0  . oxyCODONE-acetaminophen (PERCOCET) 10-325 MG tablet Take 1 tablet by mouth every 8 (eight) hours as needed.  (Patient not taking: Reported on 05/31/2020)    . benzonatate (TESSALON) 100 MG capsule Take 1 capsule (100 mg total) by mouth 2 (two) times daily as needed for cough. (Patient not taking: Reported on 05/31/2020) 20 capsule 0   No facility-administered medications prior to visit.     Review of Systems:   Constitutional:   No  weight loss, night sweats,  Fevers, chills,  +fatigue, or  lassitude.  HEENT:   No headaches,  Difficulty swallowing,  Tooth/dental problems, or  Sore throat,                No sneezing, itching, ear ache, nasal congestion, post nasal drip,   CV:  No chest pain,  Orthopnea, PND, swelling in lower extremities, anasarca, dizziness, palpitations, syncope.   GI  No heartburn, indigestion, abdominal pain, nausea, vomiting, diarrhea, change in bowel habits, loss of appetite, bloody stools.   Resp:    No chest wall deformity  Skin: no rash or lesions.  GU: no dysuria, change in color of urine, no urgency or frequency.  No flank pain, no hematuria   MS:  No joint pain or swelling.  No decreased range of motion.  No back pain.    Physical Exam  BP (!) 148/62 (BP  Location: Right Arm, Cuff Size: Normal)   Pulse 74   Temp 97.6 F (36.4 C) (Temporal)   Ht 5' 1" (1.549 m)   Wt 169 lb 9.6 oz (76.9 kg)   SpO2 100% Comment: Room air  BMI 32.05 kg/m   GEN: A/Ox3; pleasant , NAD, well nourished    HEENT:  [place]/AT,   , NOSE-clear, THROAT-clear, no lesions, no postnasal drip or exudate noted.   NECK:  Supple w/ fair ROM; no JVD; normal carotid impulses w/o bruits; no thyromegaly or nodules palpated; no lymphadenopathy.    RESP  Clear  P & A; w/o, wheezes/ rales/ or rhonchi. no accessory muscle use, no dullness to percussion  CARD:  RRR, no m/r/g, no peripheral edema, pulses intact, no cyanosis or clubbing.  GI:   Soft & nt; nml bowel sounds; no organomegaly or masses detected.   Musco: Warm bil, no deformities or joint swelling noted.   Neuro: alert, no focal deficits noted.    Skin: Warm, no lesions or rashes    Lab Results:  CBC  BMET  BNP    Component Value  Date/Time   BNP 55.0 05/19/2017 1023    ProBNP    Component Value Date/Time   PROBNP 91.5 02/03/2008 0426    Imaging: No results found.    No flowsheet data found.  No results found for: NITRICOXIDE      Assessment & Plan:   COPD mixed type ([place]) Chronic bronchitis in an active smoker.  Smoking cessation discussed.  Patient has a heavy smoking history.  Refer to the low-dose CT screening program. Continue on Stiolto.  Plan  Patient Instructions  Continue on Stiolto 2 puffs daily  Work on not smoking  Refer to LDCT screening program .   Order for new CPAP machine  Wear CPAP At bedtime   Work on healthy weight .   Follow up with [name]  In 6 months and As needed        Obstructive sleep apnea Patient has been off of her CPAP for many months due to the recall.  Will place order for new machine as she has no definitive date on to receive all of her CPAP machine. Begin CPAP 5 to 15 cm H2O.  Patient education on sleep apnea and CPAP  usage.     Rexene Edison, NP 05/31/2020

## 2020-05-31 NOTE — Assessment & Plan Note (Signed)
Chronic bronchitis in an active smoker.  Smoking cessation discussed.  Patient has a heavy smoking history.  Refer to the low-dose CT screening program. Continue on Stiolto.  Plan  Patient Instructions  Continue on Stiolto 2 puffs daily  Work on not smoking  Refer to LDCT screening program .   Order for new CPAP machine  Wear CPAP At bedtime   Work on healthy weight .   Follow up with [name]  In 6 months and As needed

## 2020-06-01 ENCOUNTER — Ambulatory Visit (HOSPITAL_COMMUNITY): Payer: Medicare HMO | Admitting: Psychiatry

## 2020-06-02 ENCOUNTER — Other Ambulatory Visit: Payer: Self-pay | Admitting: Internal Medicine

## 2020-06-02 ENCOUNTER — Telehealth: Payer: Self-pay | Admitting: Adult Health

## 2020-06-02 DIAGNOSIS — G4733 Obstructive sleep apnea (adult) (pediatric): Secondary | ICD-10-CM

## 2020-06-02 DIAGNOSIS — S8261XK Displaced fracture of lateral malleolus of right fibula, subsequent encounter for closed fracture with nonunion: Secondary | ICD-10-CM | POA: Diagnosis not present

## 2020-06-02 NOTE — Telephone Encounter (Signed)
Order placed.   Call made to patient to make aware.   Nothing further needed at this time.

## 2020-06-02 NOTE — Telephone Encounter (Signed)
LMTCB  CY please advise if okay to place order for cpap supplies to [facility] while patient waits on replacement cpap machine.    Thanks :)

## 2020-06-02 NOTE — Telephone Encounter (Signed)
Yes - DME [facility] please replace mask of choice, supplies/ hoses/ filters   dx OSA

## 2020-06-03 DIAGNOSIS — E039 Hypothyroidism, unspecified: Secondary | ICD-10-CM | POA: Diagnosis not present

## 2020-06-03 DIAGNOSIS — R2681 Unsteadiness on feet: Secondary | ICD-10-CM | POA: Diagnosis not present

## 2020-06-03 DIAGNOSIS — F17210 Nicotine dependence, cigarettes, uncomplicated: Secondary | ICD-10-CM | POA: Diagnosis not present

## 2020-06-03 DIAGNOSIS — D649 Anemia, unspecified: Secondary | ICD-10-CM | POA: Diagnosis not present

## 2020-06-03 DIAGNOSIS — E1142 Type 2 diabetes mellitus with diabetic polyneuropathy: Secondary | ICD-10-CM | POA: Diagnosis not present

## 2020-06-03 DIAGNOSIS — J449 Chronic obstructive pulmonary disease, unspecified: Secondary | ICD-10-CM | POA: Diagnosis not present

## 2020-06-03 DIAGNOSIS — J42 Unspecified chronic bronchitis: Secondary | ICD-10-CM | POA: Diagnosis not present

## 2020-06-03 DIAGNOSIS — F250 Schizoaffective disorder, bipolar type: Secondary | ICD-10-CM | POA: Diagnosis not present

## 2020-06-03 DIAGNOSIS — M160 Bilateral primary osteoarthritis of hip: Secondary | ICD-10-CM | POA: Diagnosis not present

## 2020-06-04 DIAGNOSIS — R2681 Unsteadiness on feet: Secondary | ICD-10-CM | POA: Diagnosis not present

## 2020-06-04 DIAGNOSIS — D649 Anemia, unspecified: Secondary | ICD-10-CM | POA: Diagnosis not present

## 2020-06-04 DIAGNOSIS — J42 Unspecified chronic bronchitis: Secondary | ICD-10-CM | POA: Diagnosis not present

## 2020-06-04 DIAGNOSIS — J449 Chronic obstructive pulmonary disease, unspecified: Secondary | ICD-10-CM | POA: Diagnosis not present

## 2020-06-04 DIAGNOSIS — E039 Hypothyroidism, unspecified: Secondary | ICD-10-CM | POA: Diagnosis not present

## 2020-06-04 DIAGNOSIS — F250 Schizoaffective disorder, bipolar type: Secondary | ICD-10-CM | POA: Diagnosis not present

## 2020-06-04 DIAGNOSIS — F17210 Nicotine dependence, cigarettes, uncomplicated: Secondary | ICD-10-CM | POA: Diagnosis not present

## 2020-06-04 DIAGNOSIS — E1142 Type 2 diabetes mellitus with diabetic polyneuropathy: Secondary | ICD-10-CM | POA: Diagnosis not present

## 2020-06-04 DIAGNOSIS — M160 Bilateral primary osteoarthritis of hip: Secondary | ICD-10-CM | POA: Diagnosis not present

## 2020-06-07 ENCOUNTER — Other Ambulatory Visit: Payer: Self-pay | Admitting: *Deleted

## 2020-06-07 DIAGNOSIS — F17210 Nicotine dependence, cigarettes, uncomplicated: Secondary | ICD-10-CM

## 2020-06-07 DIAGNOSIS — Z87891 Personal history of nicotine dependence: Secondary | ICD-10-CM

## 2020-06-07 NOTE — Progress Notes (Signed)
c 

## 2020-06-08 NOTE — Progress Notes (Signed)
Cardiology Office Note    Date:  06/14/2020   ID:  [name], DOB 1950-07-01, MRN [id number]   PCP:  [name], [place]  Cardiologist:  Kate Sable, MD (Inactive)  Advanced Practice Provider:  No care team member to display Electrophysiologist:  None   [phone]   No chief complaint on file.   History of Present Illness:  [name] is a 70 y.o. female with history of labile hypertension, hyperlipidemia, history of chest pain with normal cardiac cath in 2007, low risk nuclear stress test 07/2017, OSA on CPAP followed by pulmonary, anxiety depression   I saw the patient 01/07/2020 at which time chest tightness and wheezing was relieved with inhalers.  She continued to smoke.  Was having trouble with CPAP replacement.  Patient had ultrasound for leg swelling 02/27/2020 that showed no DVT but mild to moderate superficial edema and entire medial calf on the right no common femoral vein obstruction on the left.  Patient comes in for f/u. Having a hard time quitting smoking. When she overexerts or tries to sweep the floor she wheezes. BP up today was 140 earlier today. She thinks it's up because she wants a cigarette. Broke her ankle in Dec and not healing b/c of diabetes and smoking. BP is up and down at home. Watches salt but does eat fast food sometimes. Lives alone but has an Engineer, production. Has dizziness when she bends over. Her BP is usually 130-140/54. Was put on meclizine.    Past Medical History:  Diagnosis Date  . Allergy   . Anemia   . Anxiety    takes Ativan daily  . Arthritis   . Assistance needed for mobility   . Bipolar disorder ([place])    takes Risperdal nightly  . Blood transfusion   . Brain tumor ([place])   . Cancer ([place])    In her gum  . Carpal tunnel syndrome of right wrist 05/23/2011  . Cervical disc disorder with radiculopathy of cervical region 10/31/2012  . Chronic back pain   . Chronic idiopathic constipation   .  Chronic neck and back pain   . Colon polyps   . COPD (chronic obstructive pulmonary disease) with chronic bronchitis ([place]) 09/16/2013   Office Spirometry 10/30/2013-submaximal effort based on appearance of loop and curve. Numbers would fit with severe restriction but her physiologic capability may be better than this. FVC 0.91/44%, and 10.74/45%, FEV1/FVC 0.81, FEF 25-75% 1.43/69%    . Diabetes mellitus    Type II  . Diverticulosis    TCS 9/08 by Dr. Delfin Edis for diarrhea . Bx for micro scopic colitis negative.   . Fibromyalgia   . Frequent falls   . GERD (gastroesophageal reflux disease)    takes Aciphex daily  . Glaucoma    eye drops daily  . Gum symptoms    infection on antibiotic  . Hemiplegia affecting non-dominant side, post-stroke 08/02/2011  . Hiatal hernia   . Hyperlipidemia    takes Crestor daily  . Hypertension    takes Amlodipine,Metoprolol,and Clonidine daily  . Hypothyroidism    takes Synthroid daily  . IBS (irritable bowel syndrome)   . Insomnia    takes Trazodone nightly  . Major depression, recurrent ([place])    takes Zoloft daily  . 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 Metabolic encephalopathy 09/10/1599  . Migraines    chronic headaches  . Mononeuritis lower limb   . Narcolepsy   . Osteoporosis   .  Pancreatitis 2006   due to Depakote with normal EUS   . Schatzki's ring    non critical / EGD with ED 8/2011with RMR  . Seizures ([place])    takes Lamictal daily.Last seizure 3 yrs ago  . Sleep apnea    on CPAP  . Small bowel obstruction ([place])   . Stroke [facility])    left sided weakness, speech changes  . Tubular adenoma of colon     Past Surgical History:  Procedure Laterality Date  . ABDOMINAL HYSTERECTOMY  1978  . BACK SURGERY  July 2012  . BACTERIAL OVERGROWTH TEST N/A 05/05/2013   Procedure: BACTERIAL OVERGROWTH TEST;  Surgeon: Daneil Dolin, MD;  Location[facility]O [facility];  Service: Endoscopy;  Laterality: N/A;  7:30  . BIOPSY THYROID  2009  . BRAIN  SURGERY  11/2011   resection of meningioma  . BREAST REDUCTION SURGERY  1994  . CARDIAC CATHETERIZATION  05/10/2005   normal coronaries, normal LV systolic function and EF (Dr. Jackie Plum)  . CARPAL TUNNEL RELEASE Left 07/22/04   Dr. Aline Brochure  . CATARACT EXTRACTION Bilateral   . CHOLECYSTECTOMY  1984  . COLONOSCOPY N/A 09/25/2012   [id number] diverticulosis.  colonic polyp-removed : tubular adenoma  . CRANIOTOMY  11/23/2011   Procedure: CRANIOTOMY TUMOR EXCISION;  Surgeon: Hosie Spangle, MD;  Location: [facility] NEURO ORS;  Service: Neurosurgery;  Laterality: N/A;  Craniotomy for tumor resection  . ESOPHAGOGASTRODUODENOSCOPY  12/29/2010   Rourk-Retained food in the esophagus and stomach, small hiatal hernia, status post Maloney dilation of the esophagus  . ESOPHAGOGASTRODUODENOSCOPY N/A 09/25/2012   [id number]:[id number] atonic baggy esophagus status post Maloney dilation 52 F. Hiatal hernia  . GIVENS CAPSULE STUDY N/A 01/15/2013   NORMAL.   . IR GENERIC HISTORICAL  03/17/2016   IR RADIOLOGIST EVAL & MGMT 03/17/2016 [facility]-INTERV RAD  . LESION REMOVAL N/A 05/31/2015   Procedure: REMOVAL RIGHT AND LEFT LESIONS OF MANDIBLE;  Surgeon: Diona Browner, DDS;  Location: [facility];  Service: Oral Surgery;  Laterality: N/A;  . MALONEY DILATION  12/29/2010   RMR;  . NM MYOCAR PERF WALL MOTION  2006   "relavtiely normal" persantine, mild anterior thinning (breast attenuation artifact), no region of scar/ischemia  . OVARIAN CYST REMOVAL    . RECTOCELE REPAIR N/A 06/29/2015   Procedure: POSTERIOR REPAIR (RECTOCELE);  Surgeon: Jonnie Kind, MD;  Location: [facility] ORS;  Service: Gynecology;  Laterality: N/A;  . REDUCTION MAMMAPLASTY Bilateral   . SPINE SURGERY  09/29/2010   Dr. Rolena Infante  . surgical excision of 3 tumors from right thigh and right buttock  and left upper thigh  2010  . TOOTH EXTRACTION Bilateral 12/14/2014   Procedure: REMOVAL OF BILATERAL MANDIBULAR EXOSTOSES;  Surgeon: Diona Browner, DDS;  Location: [facility];  Service:  Oral Surgery;  Laterality: Bilateral;  . TRANSTHORACIC ECHOCARDIOGRAM  2010   EF 60-65%, mild conc LVH, grade 1 diastolic dysfunction; mildly calcified MV annulus with mildly thickened leaflets, mildly calcified MR annulus    Current Medications: Current Meds  Medication Sig  . Accu-Chek Softclix Lancets lancets TEST BLOOD SUGAR THREE TIMES DAILY AS DIRECTED  . albuterol (VENTOLIN HFA) 108 (90 Base) MCG/ACT inhaler INHALE 1 TO 2 PUFFS EVERY 6 HOURS AS NEEDED FOR WHEEZING, SHORTNESS OF BREATH  . Alcohol Swabs (B-D SINGLE USE SWABS REGULAR) PADS USE TO CLEAN FINGER PRIOR TO STICKING FOR BLOOD SUGAR.  [name] alendronate (FOSAMAX) 70 MG tablet TAKE 1 TABLET (70 MG TOTAL) BY MOUTH EVERY 7 (SEVEN) DAYS. TAKE  WITH A FULL GLASS OF WATER ON AN EMPTY STOMACH.  [name] amLODipine (NORVASC) 10 MG tablet TAKE 1 TABLET EVERY DAY  . ascorbic acid (VITAMIN C) 500 MG tablet Take 500 mg by mouth daily.  [name] aspirin EC 81 MG tablet Take 1 tablet (81 mg total) by mouth daily with breakfast.  . betamethasone dipropionate 0.05 % cream Apply topically 2 (two) times daily as needed ([place]).  . Blood Glucose Calibration (ACCU-CHEK GUIDE CONTROL) LIQD USE PRN TO CALIBRATE GLUCOMETER  . blood glucose meter kit and supplies Dispense based on patient and insurance preference. Use up to four times daily as directed. (FOR ICD-10 E10.9, E11.9).  [name] buPROPion (WELLBUTRIN XL) 150 MG 24 hr tablet Take 1 tablet (150 mg total) by mouth every morning.  . cetirizine (ZYRTEC) 10 MG tablet Take 1 tablet (10 mg total) by mouth daily.  . clobetasol cream (TEMOVATE) 1.94 % Apply 1 application topically 2 (two) times daily.   . Continuous Blood Gluc Sensor (FREESTYLE LIBRE 14 DAY SENSOR) MISC 1 each by Does not apply route every 14 (fourteen) days. Change every 2 weeks  . cromolyn (NASALCROM) 5.2 MG/ACT nasal spray Place 1 spray into both nostrils 4 (four) times daily.  . diclofenac Sodium (VOLTAREN) 1 % GEL APPLY 2 GRAMS  TOPICALLY 4 (FOUR) TIMES  DAILY.  [name] dicyclomine (BENTYL) 10 MG capsule Take 1 capsule (10 mg total) by mouth 3 (three) times daily as needed for spasms.  . diphenhydrAMINE (BENADRYL) 50 MG tablet Take 1 tablet (50 mg total) by mouth 2 (two) times daily as needed for itching.  . ezetimibe (ZETIA) 10 MG tablet Take 1 tablet (10 mg total) by mouth daily.  . famotidine (PEPCID) 20 MG tablet Take 1 tablet (20 mg total) by mouth 2 (two) times daily.  [name] glucose blood (ACCU-CHEK GUIDE) test strip USE TO CHECK BLOOD SUGAR FOUR TIMES A DAY AND PRN  . insulin aspart (NOVOLOG FLEXPEN) 100 UNIT/ML FlexPen Inject 16 to 18 units under skin before meals up to 3 times a day  . Insulin Pen Needle (SURE COMFORT PEN NEEDLES) 31G X 8 MM MISC USE TO for injecting insulin 4 TIMES DAILY.  [name] lamoTRIgine (LAMICTAL) 100 MG tablet Take 1 tablet (100 mg total) by mouth 2 (two) times daily.  [name] levothyroxine (SYNTHROID) 50 MCG tablet TAKE 1 TABLET DAILY, EXCEPT TAKE 1/2 TABLET ON SUNDAY  . LORazepam (ATIVAN) 0.5 MG tablet Take 1 tablet (0.5 mg total) by mouth 2 (two) times daily.  [name] losartan (COZAAR) 50 MG tablet Take 1 tablet (50 mg total) by mouth daily.  . meclizine (ANTIVERT) 25 MG tablet Take 1 tablet (25 mg total) by mouth 3 (three) times daily as needed for dizziness.  . metoprolol tartrate (LOPRESSOR) 50 MG tablet TAKE 1 TABLET TWICE DAILY (NEED MD APPOINTMENT)  . montelukast (SINGULAIR) 10 MG tablet Take 1 tablet (10 mg total) by mouth daily.  [name] nystatin (MYCOSTATIN/NYSTOP) powder APPLY TO AFFECTED AREA 4 TIMES DAILY.  [name] Omega-3 Fatty Acids (FISH OIL PO) Take 1 capsule by mouth daily.  [name] oxyCODONE (OXY IR/ROXICODONE) 5 MG immediate release tablet Take 5 mg by mouth every 6 (six) hours as needed.  [name] Plecanatide (TRULANCE) 3 MG TABS Take 3 mg by mouth daily.  . pregabalin (LYRICA) 75 MG capsule Take 1 capsule (75 mg total) by mouth daily.  . RABEprazole (ACIPHEX) 20 MG tablet TAKE 1 TABLET TWICE DAILY (MUST KEEP 07/2020 APPT FOR FURTHER REFILLS)   . risperiDONE (RISPERDAL) 0.5  MG tablet Take 1 tablet (0.5 mg total) by mouth at bedtime.  . rosuvastatin (CRESTOR) 5 MG tablet TAKE 1 TABLET AT BEDTIME  . sertraline (ZOLOFT) 100 MG tablet Take 1 tablet (100 mg total) by mouth daily.  [name] STIOLTO RESPIMAT 2.5-2.5 MCG/ACT AERS INHALE 2 PUFFS INTO THE LUNGS DAILY.  [name] TOUJEO MAX SOLOSTAR 300 UNIT/ML Solostar Pen INJECT 24 UNITS SUBCUTANEOUSLY INTO THE SKIN AT BEDTIME  . traZODone (DESYREL) 100 MG tablet Take 1 tablet (100 mg total) by mouth at bedtime.  . triamcinolone (KENALOG) 0.1 % Apply 1 application topically daily.  [name] UNABLE TO [facility] bed mattress x 1  DX: G47.33, J44.9  . UNABLE TO FIND Standard wheelchair Dx M47.16, [id number]     Allergies:   Lac bovis, Phenazopyridine hcl, Cephalexin, Flonase [fluticasone], Iron, Milk-related compounds, Penicillins, and Phenazopyridine hcl   Social History   Socioeconomic History  . Marital status: Divorced    Spouse name: Not on file  . Number of children: 1  . Years of education: 36  . Highest education level: High school graduate  Occupational History  . Occupation: Disabled  Tobacco Use  . Smoking status: Current Some Day Smoker    Packs/day: 0.50    Years: 30.00    Pack years: 15.00    Types: Cigarettes  . Smokeless tobacco: Never Used  . Tobacco comment: smokes 1 pack per day 05/31/2020  Vaping Use  . Vaping Use: Never used  Substance and Sexual Activity  . Alcohol use: No    Alcohol/week: 0.0 standard drinks    Comment:    . Drug use: No  . Sexual activity: Not Currently  Other Topics Concern  . Not on file  Social History Narrative   01/29/18 Lives alone, has 3 aides, Mon- Fri 8 hrs, 2 hrs on Sat-Sun, RN manages her meds   Caffeine use: Drink coffee sometimes    Right handed    Social Determinants of Radio broadcast assistant Strain: Not on file  Food Insecurity: Not on file  Transportation Needs: No Transportation Needs  . Lack of Transportation (Medical): No   . Lack of Transportation (Non-Medical): No  Physical Activity: Not on file  Stress: Not on file  Social Connections: Not on file     Family History:  The patient's family history includes Alcohol abuse in her maternal uncle; Cancer in her sister and sister; Diabetes in her brother, brother, and father; Heart attack in her maternal grandfather and mother; Hypertension in her brother and son; Kidney failure in her father; Pancreatic cancer in her sister; Pneumonia in her father; Sleep apnea in her son; Stroke in her maternal grandmother.   ROS:   Please see the history of present illness.    ROS All other systems reviewed and are negative.   PHYSICAL EXAM:   VS:  BP (!) 160/58   Pulse 66   Ht 5' 1"  (1.549 m)   Wt 170 lb 9.6 oz (77.4 kg)   SpO2 98%   BMI 32.23 kg/m   Physical Exam  GEN: Obese, in no acute distress  Neck: no JVD, carotid bruits, or masses Cardiac:RRR; 2/6 systolic murmur LSB Respiratory:  clear to auscultation bilaterally, normal work of breathing GI: soft, nontender, nondistended, + BS Ext: without cyanosis, clubbing, or edema, Good distal pulses bilaterally Neuro:  Alert and Oriented x 3 Psych: euthymic mood, full affect  Wt Readings from Last 3 Encounters:  06/14/20 170 lb 9.6 oz (77.4 kg)  05/31/20 169  lb 9.6 oz (76.9 kg)  05/11/20 172 lb 3.2 oz (78.1 kg)      Studies/Labs Reviewed:   EKG:  EKG is not ordered today.    Recent Labs: 12/11/2019: TSH 0.631 05/04/2020: ALT 23; BUN 28; Creatinine, Ser 1.13; Hemoglobin 11.2; Platelets 238; Potassium 4.3; Sodium 139   Lipid Panel    Component Value Date/Time   CHOL 125 09/23/2019 1129   TRIG 117 09/23/2019 1129   HDL 45 09/23/2019 1129   CHOLHDL 3.5 09/30/2018 1143   VLDL 26 07/19/2015 0925   LDLCALC 59 09/23/2019 1129   LDLCALC 79 09/30/2018 1143    Additional studies/ records that were reviewed today include:  Lower extremity venous Dopplers 02/27/2020 Summary:  RIGHT:  - No evidence of deep  vein thrombosis in the lower extremity. No indirect  evidence of obstruction proximal to the inguinal ligament.  - No cystic structure found in the popliteal fossa.  - Mild to moderate superficial edema seen entire medial calf.    LEFT:  - No evidence of common femoral vein obstruction.    *See table(s) above for measurements and observations.   Electronically signed by Ida Rogue MD on 02/27/2020 at 9:21:08 PM.    NST: 07/2017  The study is normal.  This is a low risk study.  The left ventricular ejection fraction is hyperdynamic (>65%).  Blood pressure demonstrated a normal response to exercise.  There was no ST segment deviation noted during stress.   Normal resting and stress perfusion. No ischemia or infarction EF 72%   Echocardiogram: 02/2015 Study Conclusions   - Left ventricle: The cavity size was normal. Wall thickness was   increased in a pattern of mild LVH. Systolic function was normal.   The estimated ejection fraction was in the range of 60% to 65%.   Indeterminate diastolic function. Wall motion was normal; there   were no regional wall motion abnormalities. - Aortic valve: Valve area (VTI): 2.06 cm^2. Valve area (Vmax):   2.39 cm^2. - Mitral valve: There was mild regurgitation. - Left atrium: The atrium was mildly dilated. - Atrial septum: No defect or patent foramen ovale was identified. - Pulmonary arteries: Systolic pressure was moderately increased.   PA peak pressure: 45 mm Hg (S). - Technically adequate study.         Risk Assessment/Calculations:         ASSESSMENT:    1. Essential hypertension   2. Hyperlipidemia, unspecified hyperlipidemia type   3. Chest pain, unspecified type   4. Obstructive sleep apnea   5. Bilateral carotid bruits   6. Polypharmacy      PLAN:  In order of problems listed above: Essential hypertension BP up today but has been ok at home-hold off on changes to her medicines at this time because she is  having some dizziness.  I have asked her to check this daily and let us know if it runs high.   Hyperlipidemia on crestor and zetia managed by PCP   History of chest pain with low risk nuclear stress test 07/2017, normal cardiac cath 2007-no recent chest pain. No changes  OSA on CPAP followed by pulmonary CPAP is currently on recall and pulmonary has ordered a replacement. It has not come in yet. She is having a lot of symptoms from this. It's supposed to be in by the end of 2022  Carotid stenosis 1-49% 01/2020-smoking cessation essential     Tobacco abuse. Having a hard time quitting. Nicotine patches/lozenges not working. Has  used chantix a long time ago that worked but she has depression and. Will let her f/u with PCP and behavioral health to discuss with.   Shared Decision Making/Informed Consent        Medication Adjustments/Labs and Tests Ordered: Current medicines are reviewed at length with the patient today.  Concerns regarding medicines are outlined above.  Medication changes, Labs and Tests ordered today are listed in the Patient Instructions below. Patient Instructions  Medication Instructions:  Your physician recommends that you continue on your current medications as directed. Please refer to the Current Medication list given to you today.  *If you need a refill on your cardiac medications before your next appointment, please call your pharmacy*   Lab Work: NONE   If you have labs (blood work) drawn today and your tests are completely normal, you will receive your results only by: [name] MyChart Message (if you have MyChart) OR . A paper copy in the mail If you have any lab test that is abnormal or we need to change your treatment, we will call you to review the results.   Testing/Procedures: NONE    Follow-Up: At [facility], you and your health needs are our priority.  As part of our continuing mission to provide you with exceptional heart care, we have created  designated Provider Care Teams.  These Care Teams include your primary Cardiologist (physician) and Advanced Practice Providers (APPs -  Physician Assistants and Nurse Practitioners) who all work together to provide you with the care you need, when you need it.  We recommend signing up for the patient portal called "MyChart".  Sign up information is provided on this After Visit Summary.  MyChart is used to connect with patients for Virtual Visits (Telemedicine).  Patients are able to view lab/test results, encounter notes, upcoming appointments, etc.  Non-urgent messages can be sent to your provider as well.   To learn more about what you can do with MyChart, go to [email or web address].    Your next appointment:   6 month(s)  The format for your next appointment:   In Person  Provider:   With MD    Other Instructions Thank you for choosing [facility]!        Sumner Boast, PA-C  06/14/2020 2:53 PM    [facility] [address], [place], [place]  [address] Phone: [phone]; Fax: [phone]

## 2020-06-09 DIAGNOSIS — F250 Schizoaffective disorder, bipolar type: Secondary | ICD-10-CM | POA: Diagnosis not present

## 2020-06-09 DIAGNOSIS — R2681 Unsteadiness on feet: Secondary | ICD-10-CM | POA: Diagnosis not present

## 2020-06-09 DIAGNOSIS — E1142 Type 2 diabetes mellitus with diabetic polyneuropathy: Secondary | ICD-10-CM | POA: Diagnosis not present

## 2020-06-09 DIAGNOSIS — F17210 Nicotine dependence, cigarettes, uncomplicated: Secondary | ICD-10-CM | POA: Diagnosis not present

## 2020-06-09 DIAGNOSIS — D649 Anemia, unspecified: Secondary | ICD-10-CM | POA: Diagnosis not present

## 2020-06-09 DIAGNOSIS — J42 Unspecified chronic bronchitis: Secondary | ICD-10-CM | POA: Diagnosis not present

## 2020-06-09 DIAGNOSIS — E039 Hypothyroidism, unspecified: Secondary | ICD-10-CM | POA: Diagnosis not present

## 2020-06-09 DIAGNOSIS — J449 Chronic obstructive pulmonary disease, unspecified: Secondary | ICD-10-CM | POA: Diagnosis not present

## 2020-06-09 DIAGNOSIS — M160 Bilateral primary osteoarthritis of hip: Secondary | ICD-10-CM | POA: Diagnosis not present

## 2020-06-10 DIAGNOSIS — D649 Anemia, unspecified: Secondary | ICD-10-CM | POA: Diagnosis not present

## 2020-06-10 DIAGNOSIS — F250 Schizoaffective disorder, bipolar type: Secondary | ICD-10-CM | POA: Diagnosis not present

## 2020-06-10 DIAGNOSIS — J42 Unspecified chronic bronchitis: Secondary | ICD-10-CM | POA: Diagnosis not present

## 2020-06-10 DIAGNOSIS — J449 Chronic obstructive pulmonary disease, unspecified: Secondary | ICD-10-CM | POA: Diagnosis not present

## 2020-06-10 DIAGNOSIS — R2681 Unsteadiness on feet: Secondary | ICD-10-CM | POA: Diagnosis not present

## 2020-06-10 DIAGNOSIS — E1142 Type 2 diabetes mellitus with diabetic polyneuropathy: Secondary | ICD-10-CM | POA: Diagnosis not present

## 2020-06-10 DIAGNOSIS — E039 Hypothyroidism, unspecified: Secondary | ICD-10-CM | POA: Diagnosis not present

## 2020-06-10 DIAGNOSIS — F17210 Nicotine dependence, cigarettes, uncomplicated: Secondary | ICD-10-CM | POA: Diagnosis not present

## 2020-06-10 DIAGNOSIS — M160 Bilateral primary osteoarthritis of hip: Secondary | ICD-10-CM | POA: Diagnosis not present

## 2020-06-11 DIAGNOSIS — E785 Hyperlipidemia, unspecified: Secondary | ICD-10-CM | POA: Diagnosis not present

## 2020-06-11 DIAGNOSIS — F250 Schizoaffective disorder, bipolar type: Secondary | ICD-10-CM | POA: Diagnosis not present

## 2020-06-11 DIAGNOSIS — E039 Hypothyroidism, unspecified: Secondary | ICD-10-CM | POA: Diagnosis not present

## 2020-06-11 DIAGNOSIS — R079 Chest pain, unspecified: Secondary | ICD-10-CM | POA: Diagnosis not present

## 2020-06-11 DIAGNOSIS — F17210 Nicotine dependence, cigarettes, uncomplicated: Secondary | ICD-10-CM | POA: Diagnosis not present

## 2020-06-11 DIAGNOSIS — R2681 Unsteadiness on feet: Secondary | ICD-10-CM | POA: Diagnosis not present

## 2020-06-11 DIAGNOSIS — J449 Chronic obstructive pulmonary disease, unspecified: Secondary | ICD-10-CM | POA: Diagnosis not present

## 2020-06-11 DIAGNOSIS — E1142 Type 2 diabetes mellitus with diabetic polyneuropathy: Secondary | ICD-10-CM | POA: Diagnosis not present

## 2020-06-11 DIAGNOSIS — M160 Bilateral primary osteoarthritis of hip: Secondary | ICD-10-CM | POA: Diagnosis not present

## 2020-06-11 DIAGNOSIS — I10 Essential (primary) hypertension: Secondary | ICD-10-CM | POA: Diagnosis not present

## 2020-06-11 DIAGNOSIS — J42 Unspecified chronic bronchitis: Secondary | ICD-10-CM | POA: Diagnosis not present

## 2020-06-11 DIAGNOSIS — Z79899 Other long term (current) drug therapy: Secondary | ICD-10-CM | POA: Diagnosis not present

## 2020-06-11 DIAGNOSIS — R0989 Other specified symptoms and signs involving the circulatory and respiratory systems: Secondary | ICD-10-CM | POA: Diagnosis not present

## 2020-06-11 DIAGNOSIS — G4733 Obstructive sleep apnea (adult) (pediatric): Secondary | ICD-10-CM | POA: Diagnosis not present

## 2020-06-11 DIAGNOSIS — D649 Anemia, unspecified: Secondary | ICD-10-CM | POA: Diagnosis not present

## 2020-06-14 ENCOUNTER — Ambulatory Visit (INDEPENDENT_AMBULATORY_CARE_PROVIDER_SITE_OTHER): Payer: Medicare HMO | Admitting: Physician Assistant

## 2020-06-14 ENCOUNTER — Other Ambulatory Visit: Payer: Self-pay

## 2020-06-14 ENCOUNTER — Encounter: Payer: Self-pay | Admitting: Physician Assistant

## 2020-06-14 VITALS — BP 160/58 | HR 66 | Ht 61.0 in | Wt 170.6 lb

## 2020-06-14 DIAGNOSIS — Z79899 Other long term (current) drug therapy: Secondary | ICD-10-CM

## 2020-06-14 DIAGNOSIS — R079 Chest pain, unspecified: Secondary | ICD-10-CM

## 2020-06-14 DIAGNOSIS — R0989 Other specified symptoms and signs involving the circulatory and respiratory systems: Secondary | ICD-10-CM | POA: Diagnosis not present

## 2020-06-14 DIAGNOSIS — E785 Hyperlipidemia, unspecified: Secondary | ICD-10-CM

## 2020-06-14 DIAGNOSIS — G4733 Obstructive sleep apnea (adult) (pediatric): Secondary | ICD-10-CM

## 2020-06-14 DIAGNOSIS — R2681 Unsteadiness on feet: Secondary | ICD-10-CM | POA: Diagnosis not present

## 2020-06-14 DIAGNOSIS — I10 Essential (primary) hypertension: Secondary | ICD-10-CM | POA: Diagnosis not present

## 2020-06-14 DIAGNOSIS — J42 Unspecified chronic bronchitis: Secondary | ICD-10-CM | POA: Diagnosis not present

## 2020-06-14 DIAGNOSIS — E1142 Type 2 diabetes mellitus with diabetic polyneuropathy: Secondary | ICD-10-CM | POA: Diagnosis not present

## 2020-06-14 DIAGNOSIS — F250 Schizoaffective disorder, bipolar type: Secondary | ICD-10-CM | POA: Diagnosis not present

## 2020-06-14 DIAGNOSIS — K5730 Diverticulosis of large intestine without perforation or abscess without bleeding: Secondary | ICD-10-CM

## 2020-06-14 DIAGNOSIS — J449 Chronic obstructive pulmonary disease, unspecified: Secondary | ICD-10-CM | POA: Diagnosis not present

## 2020-06-14 DIAGNOSIS — F17210 Nicotine dependence, cigarettes, uncomplicated: Secondary | ICD-10-CM | POA: Diagnosis not present

## 2020-06-14 DIAGNOSIS — M160 Bilateral primary osteoarthritis of hip: Secondary | ICD-10-CM | POA: Diagnosis not present

## 2020-06-14 DIAGNOSIS — R296 Repeated falls: Secondary | ICD-10-CM

## 2020-06-14 DIAGNOSIS — E039 Hypothyroidism, unspecified: Secondary | ICD-10-CM | POA: Diagnosis not present

## 2020-06-14 DIAGNOSIS — K589 Irritable bowel syndrome without diarrhea: Secondary | ICD-10-CM

## 2020-06-14 DIAGNOSIS — D649 Anemia, unspecified: Secondary | ICD-10-CM | POA: Diagnosis not present

## 2020-06-14 DIAGNOSIS — Z794 Long term (current) use of insulin: Secondary | ICD-10-CM

## 2020-06-14 NOTE — Patient Instructions (Signed)
Medication Instructions:  Your physician recommends that you continue on your current medications as directed. Please refer to the Current Medication list given to you today.  *If you need a refill on your cardiac medications before your next appointment, please call your pharmacy*   Lab Work: NONE   If you have labs (blood work) drawn today and your tests are completely normal, you will receive your results only by: [name] MyChart Message (if you have MyChart) OR . A paper copy in the mail If you have any lab test that is abnormal or we need to change your treatment, we will call you to review the results.   Testing/Procedures: NONE    Follow-Up: At [facility], you and your health needs are our priority.  As part of our continuing mission to provide you with exceptional heart care, we have created designated Provider Care Teams.  These Care Teams include your primary Cardiologist (physician) and Advanced Practice Providers (APPs -  Physician Assistants and Nurse Practitioners) who all work together to provide you with the care you need, when you need it.  We recommend signing up for the patient portal called "MyChart".  Sign up information is provided on this After Visit Summary.  MyChart is used to connect with patients for Virtual Visits (Telemedicine).  Patients are able to view lab/test results, encounter notes, upcoming appointments, etc.  Non-urgent messages can be sent to your provider as well.   To learn more about what you can do with MyChart, go to [email or web address].    Your next appointment:   6 month(s)  The format for your next appointment:   In Person  Provider:   With MD    Other Instructions Thank you for choosing [facility]!

## 2020-06-16 ENCOUNTER — Telehealth: Payer: Self-pay | Admitting: Adult Health

## 2020-06-16 DIAGNOSIS — R2681 Unsteadiness on feet: Secondary | ICD-10-CM | POA: Diagnosis not present

## 2020-06-16 DIAGNOSIS — J42 Unspecified chronic bronchitis: Secondary | ICD-10-CM | POA: Diagnosis not present

## 2020-06-16 DIAGNOSIS — F250 Schizoaffective disorder, bipolar type: Secondary | ICD-10-CM | POA: Diagnosis not present

## 2020-06-16 DIAGNOSIS — F17210 Nicotine dependence, cigarettes, uncomplicated: Secondary | ICD-10-CM | POA: Diagnosis not present

## 2020-06-16 DIAGNOSIS — J449 Chronic obstructive pulmonary disease, unspecified: Secondary | ICD-10-CM | POA: Diagnosis not present

## 2020-06-16 DIAGNOSIS — D649 Anemia, unspecified: Secondary | ICD-10-CM | POA: Diagnosis not present

## 2020-06-16 DIAGNOSIS — E039 Hypothyroidism, unspecified: Secondary | ICD-10-CM | POA: Diagnosis not present

## 2020-06-16 DIAGNOSIS — E1142 Type 2 diabetes mellitus with diabetic polyneuropathy: Secondary | ICD-10-CM | POA: Diagnosis not present

## 2020-06-16 DIAGNOSIS — M160 Bilateral primary osteoarthritis of hip: Secondary | ICD-10-CM | POA: Diagnosis not present

## 2020-06-16 NOTE — Telephone Encounter (Signed)
Last seen 02-04-2019. Needs appointment. We see for seizures, lamotrigine.  Called and LMVM for pt that returned call.  She can make appt. Will get labs if needed.

## 2020-06-16 NOTE — Telephone Encounter (Signed)
Pt called wanting to ask if she is needing to get bloodwork done before she schedules her f/u appt. Please advise.

## 2020-06-17 ENCOUNTER — Ambulatory Visit (INDEPENDENT_AMBULATORY_CARE_PROVIDER_SITE_OTHER): Payer: Medicare HMO | Admitting: Psychiatry

## 2020-06-17 ENCOUNTER — Other Ambulatory Visit: Payer: Self-pay

## 2020-06-17 DIAGNOSIS — F331 Major depressive disorder, recurrent, moderate: Secondary | ICD-10-CM | POA: Diagnosis not present

## 2020-06-17 NOTE — Telephone Encounter (Signed)
Called pt and I relayed since we have not seen her tsince 01-2019, she should come in for appt first then if needing labs can do so then.  She will call back as could not schedule the appt right then.
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
## 2020-06-19 IMAGING — MG MM DIGITAL SCREENING BILAT W/ TOMO W/ CAD
8 series · 8 of 24 positions shown · non-contrast
Comparison: Previous exam(s).

ACR Breast Density Category a: The breast tissue is almost entirely
fatty.

CLINICAL DATA: Screening.

EXAM:
DIGITAL SCREENING BILATERAL MAMMOGRAM WITH TOMO AND CAD

[L CC synth-2D]
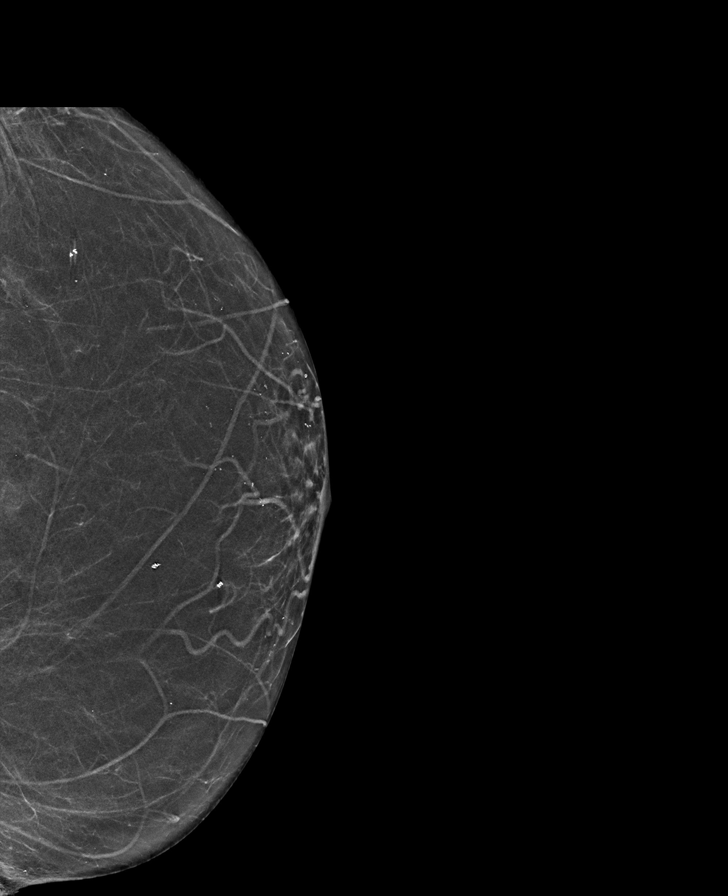

[R CC synth-2D]
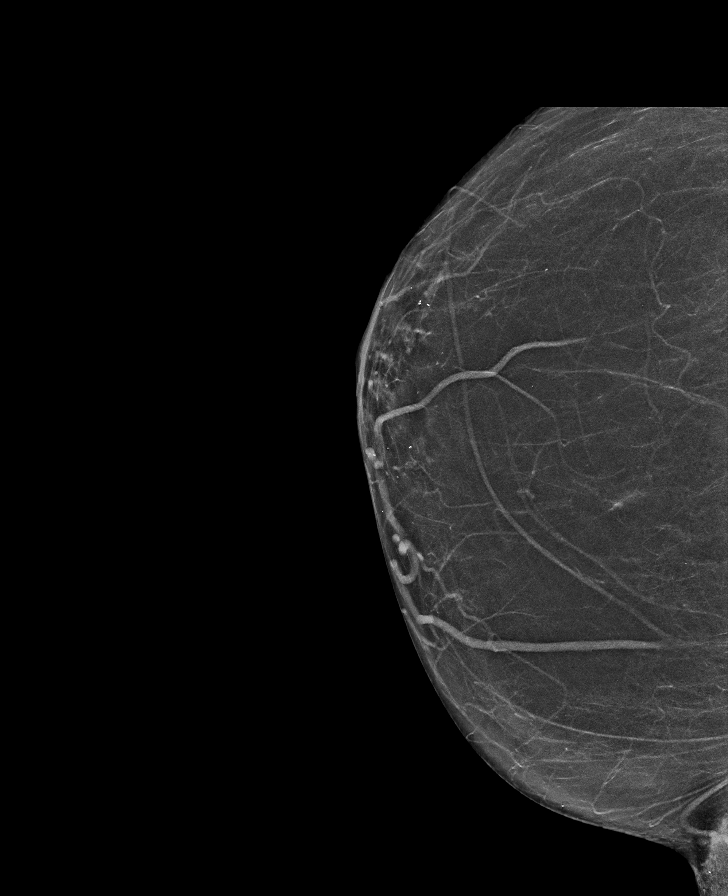

[R MLO synth-2D]
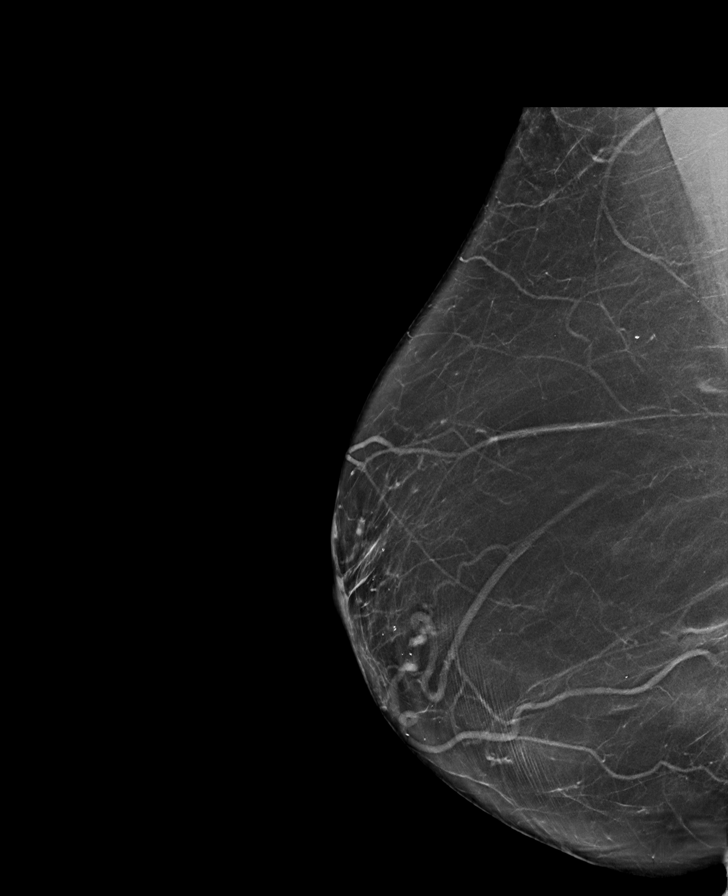

[L MLO synth-2D]
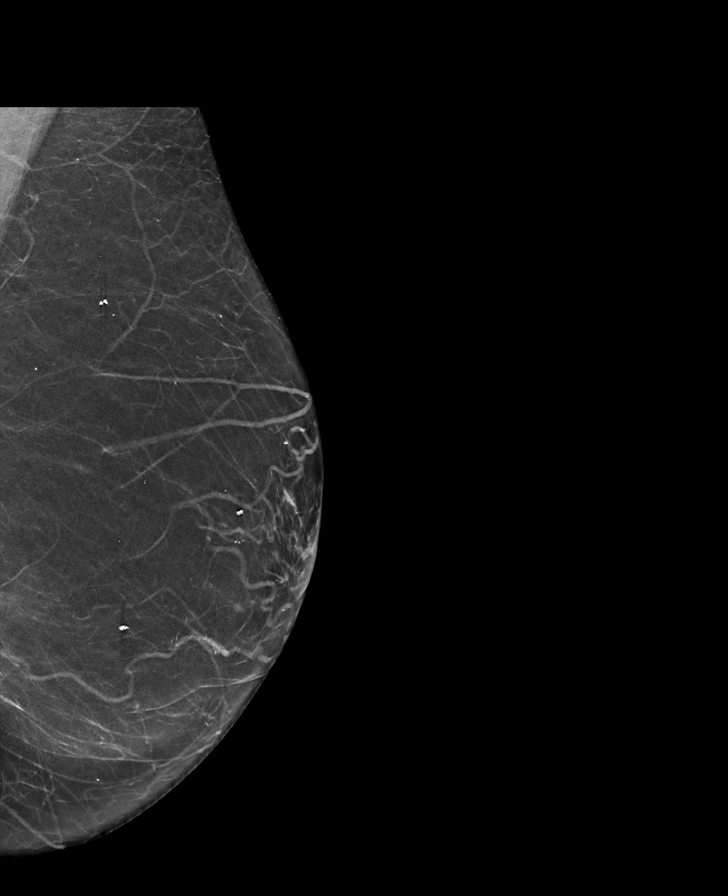

[R CC tomo · tomo slice 31/60.0]
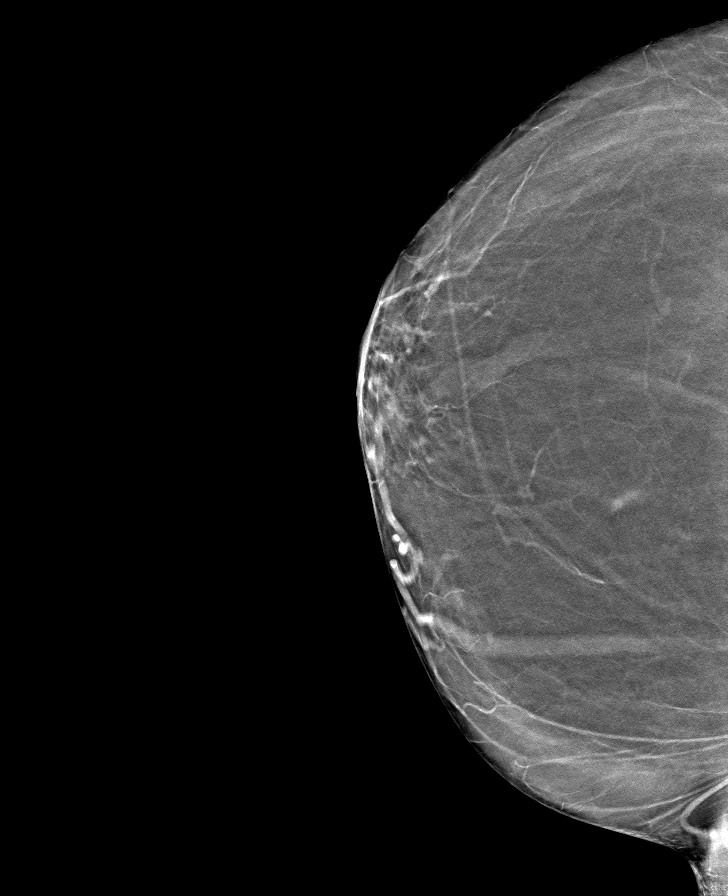

[L CC tomo · tomo slice 27/54.0]
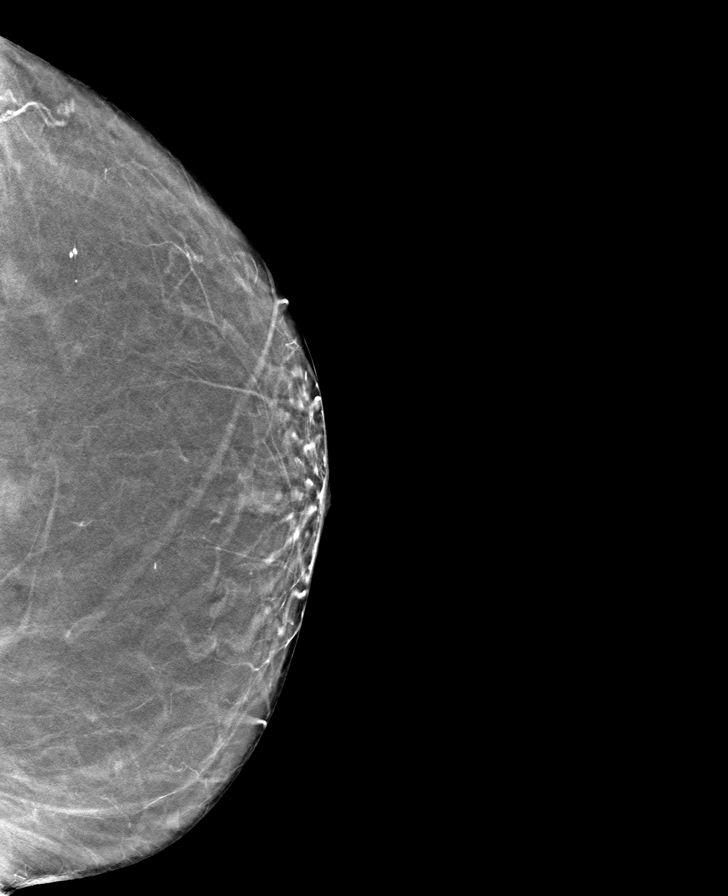

[R MLO tomo · tomo slice 39/77.0]
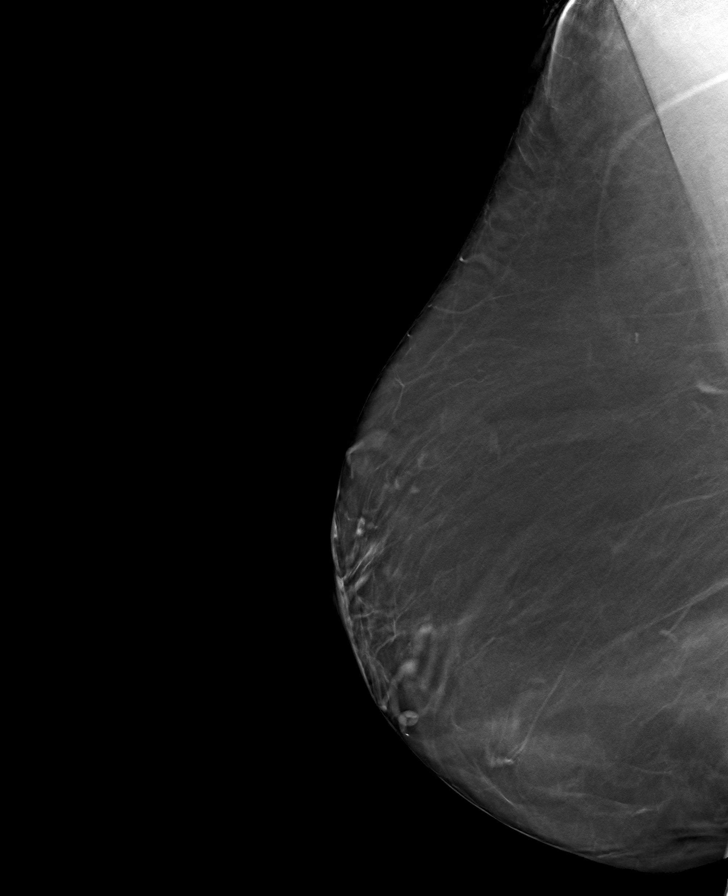

[L MLO tomo · tomo slice 31/62.0]
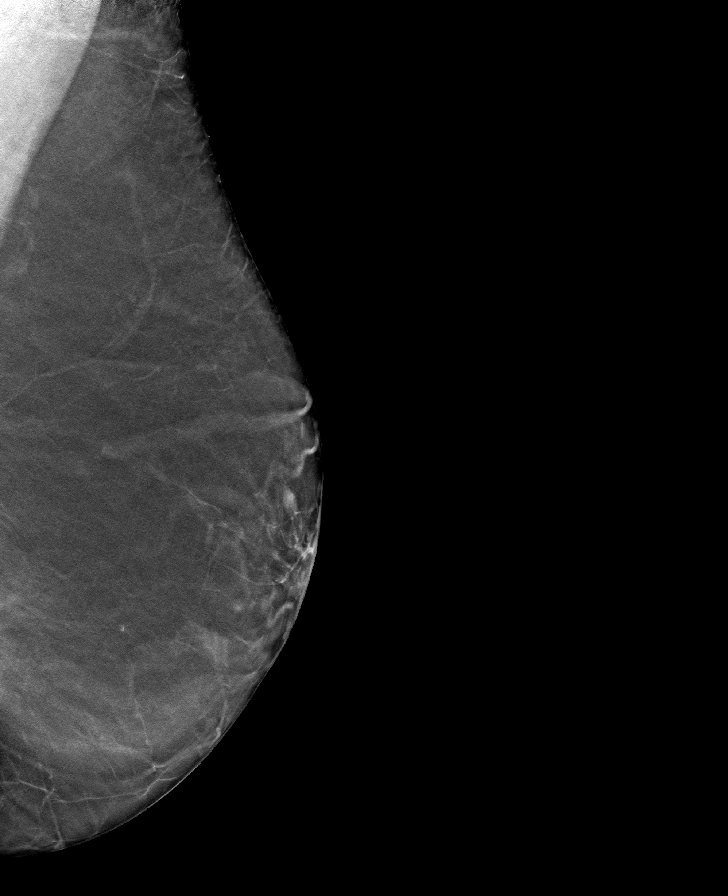

[8 of 24 positions shown; findings below may reference images not displayed]

FINDINGS: There are no findings suspicious for malignancy. Images were
processed with CAD.
IMPRESSION: No mammographic evidence of malignancy. A result letter of this
screening mammogram will be mailed directly to the patient.

RECOMMENDATION:
Screening mammogram in one year. (Code:8Y-Q-VVS)

BI-RADS CATEGORY  1: Negative.

## 2020-06-20 ENCOUNTER — Other Ambulatory Visit: Payer: Self-pay | Admitting: Family Medicine

## 2020-06-21 DIAGNOSIS — S8261XK Displaced fracture of lateral malleolus of right fibula, subsequent encounter for closed fracture with nonunion: Secondary | ICD-10-CM | POA: Diagnosis not present

## 2020-06-23 DIAGNOSIS — M160 Bilateral primary osteoarthritis of hip: Secondary | ICD-10-CM | POA: Diagnosis not present

## 2020-06-23 DIAGNOSIS — F250 Schizoaffective disorder, bipolar type: Secondary | ICD-10-CM | POA: Diagnosis not present

## 2020-06-23 DIAGNOSIS — R2681 Unsteadiness on feet: Secondary | ICD-10-CM | POA: Diagnosis not present

## 2020-06-23 DIAGNOSIS — E039 Hypothyroidism, unspecified: Secondary | ICD-10-CM | POA: Diagnosis not present

## 2020-06-23 DIAGNOSIS — D649 Anemia, unspecified: Secondary | ICD-10-CM | POA: Diagnosis not present

## 2020-06-23 DIAGNOSIS — F17210 Nicotine dependence, cigarettes, uncomplicated: Secondary | ICD-10-CM | POA: Diagnosis not present

## 2020-06-23 DIAGNOSIS — E1142 Type 2 diabetes mellitus with diabetic polyneuropathy: Secondary | ICD-10-CM | POA: Diagnosis not present

## 2020-06-23 DIAGNOSIS — J449 Chronic obstructive pulmonary disease, unspecified: Secondary | ICD-10-CM | POA: Diagnosis not present

## 2020-06-23 DIAGNOSIS — J42 Unspecified chronic bronchitis: Secondary | ICD-10-CM | POA: Diagnosis not present

## 2020-06-24 ENCOUNTER — Encounter (HOSPITAL_COMMUNITY): Payer: Self-pay

## 2020-06-25 DIAGNOSIS — F17210 Nicotine dependence, cigarettes, uncomplicated: Secondary | ICD-10-CM | POA: Diagnosis not present

## 2020-06-25 DIAGNOSIS — E039 Hypothyroidism, unspecified: Secondary | ICD-10-CM | POA: Diagnosis not present

## 2020-06-25 DIAGNOSIS — M160 Bilateral primary osteoarthritis of hip: Secondary | ICD-10-CM | POA: Diagnosis not present

## 2020-06-25 DIAGNOSIS — F250 Schizoaffective disorder, bipolar type: Secondary | ICD-10-CM | POA: Diagnosis not present

## 2020-06-25 DIAGNOSIS — E1142 Type 2 diabetes mellitus with diabetic polyneuropathy: Secondary | ICD-10-CM | POA: Diagnosis not present

## 2020-06-25 DIAGNOSIS — D649 Anemia, unspecified: Secondary | ICD-10-CM | POA: Diagnosis not present

## 2020-06-25 DIAGNOSIS — R2681 Unsteadiness on feet: Secondary | ICD-10-CM | POA: Diagnosis not present

## 2020-06-25 DIAGNOSIS — J42 Unspecified chronic bronchitis: Secondary | ICD-10-CM | POA: Diagnosis not present

## 2020-06-25 DIAGNOSIS — J449 Chronic obstructive pulmonary disease, unspecified: Secondary | ICD-10-CM | POA: Diagnosis not present

## 2020-06-28 DIAGNOSIS — D649 Anemia, unspecified: Secondary | ICD-10-CM | POA: Diagnosis not present

## 2020-06-28 DIAGNOSIS — M160 Bilateral primary osteoarthritis of hip: Secondary | ICD-10-CM | POA: Diagnosis not present

## 2020-06-28 DIAGNOSIS — F17210 Nicotine dependence, cigarettes, uncomplicated: Secondary | ICD-10-CM | POA: Diagnosis not present

## 2020-06-28 DIAGNOSIS — E039 Hypothyroidism, unspecified: Secondary | ICD-10-CM | POA: Diagnosis not present

## 2020-06-28 DIAGNOSIS — R2681 Unsteadiness on feet: Secondary | ICD-10-CM | POA: Diagnosis not present

## 2020-06-28 DIAGNOSIS — E1142 Type 2 diabetes mellitus with diabetic polyneuropathy: Secondary | ICD-10-CM | POA: Diagnosis not present

## 2020-06-28 DIAGNOSIS — F250 Schizoaffective disorder, bipolar type: Secondary | ICD-10-CM | POA: Diagnosis not present

## 2020-06-28 DIAGNOSIS — J449 Chronic obstructive pulmonary disease, unspecified: Secondary | ICD-10-CM | POA: Diagnosis not present

## 2020-06-28 DIAGNOSIS — J42 Unspecified chronic bronchitis: Secondary | ICD-10-CM | POA: Diagnosis not present

## 2020-06-28 IMAGING — CT CT ABD-PELV W/ CM
2 of 5 series · 15 of 46 positions shown, 17 images · IV contrast (omnipaque)
Comparison: June 28, 2017

CLINICAL DATA: Abdominal distension and nausea

EXAM:
CT ABDOMEN AND PELVIS WITH CONTRAST
TECHNIQUE: Multidetector CT imaging of the abdomen and pelvis was performed
using the standard protocol following bolus administration of
intravenous contrast.
CONTRAST:  100mL OMNIPAQUE IOHEXOL 300 MG/ML  SOLN

[Series 2: axial st · axial · 0.78mm/px · z∈[+720,+1075]mm · 12 of 85 slices shown, 14 images]
[im 7/85  soft-tissue]
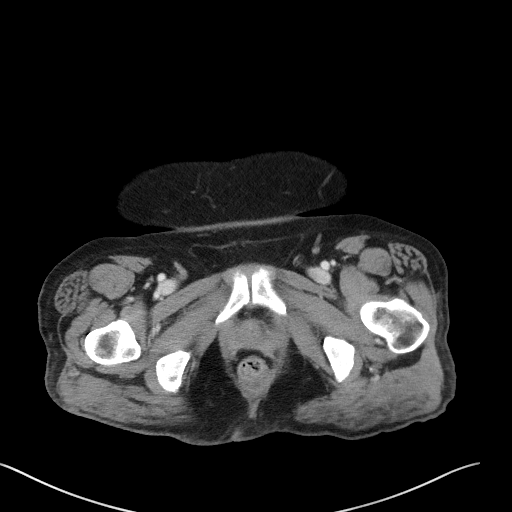
[im 7/85  bone]
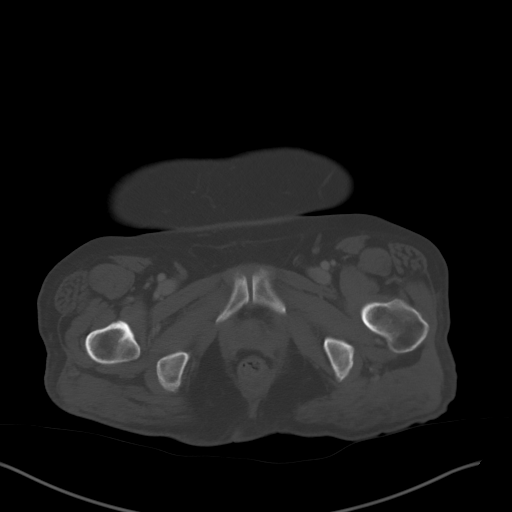
[im 13/85  soft-tissue]
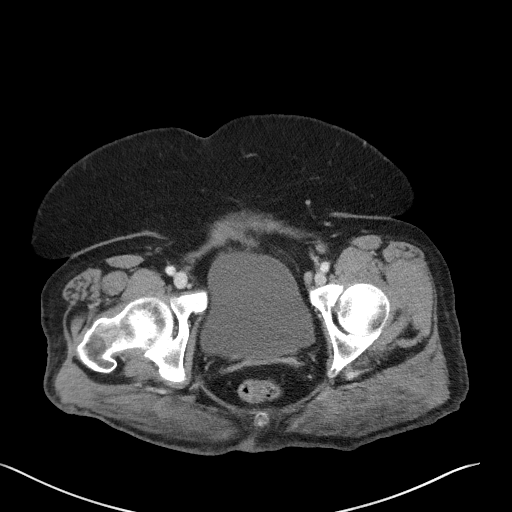
[im 20/85  soft-tissue]
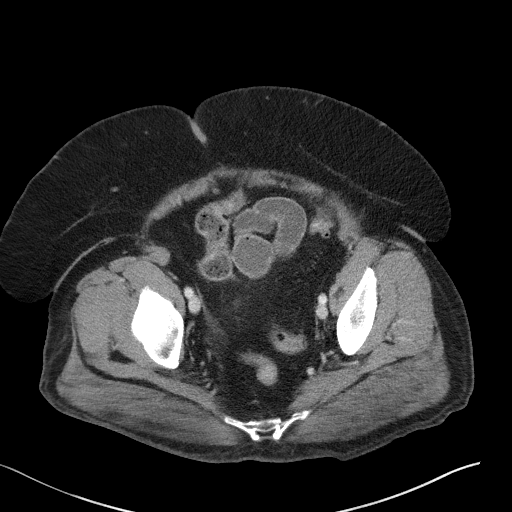
[im 26/85  soft-tissue]
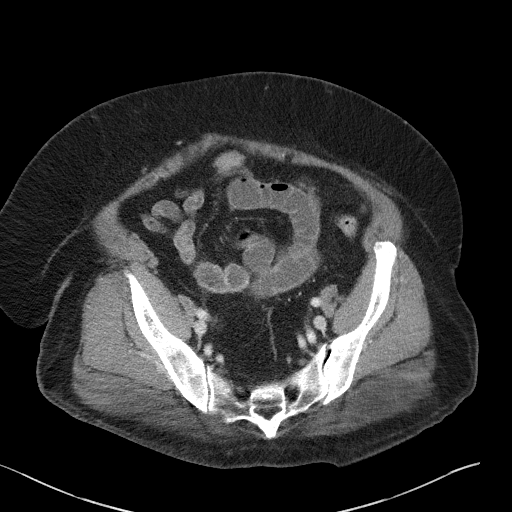
[im 33/85  soft-tissue]
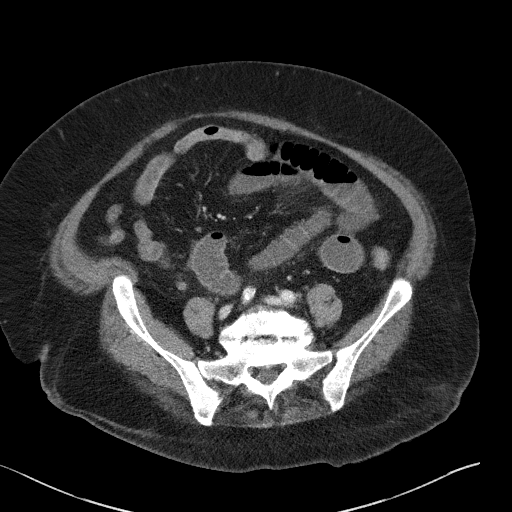
[im 39/85  soft-tissue]
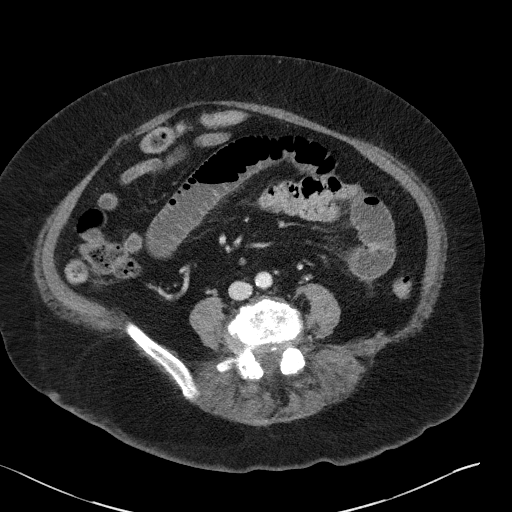
[im 46/85  soft-tissue]
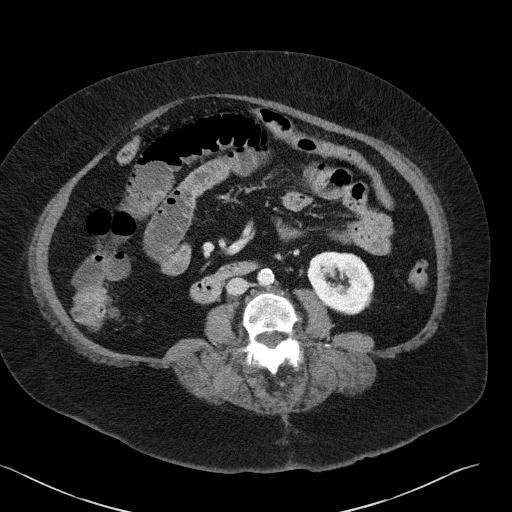
[im 52/85  soft-tissue]
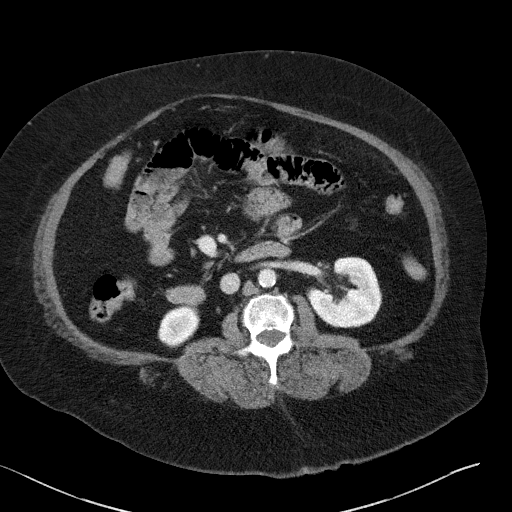
[im 59/85  soft-tissue]
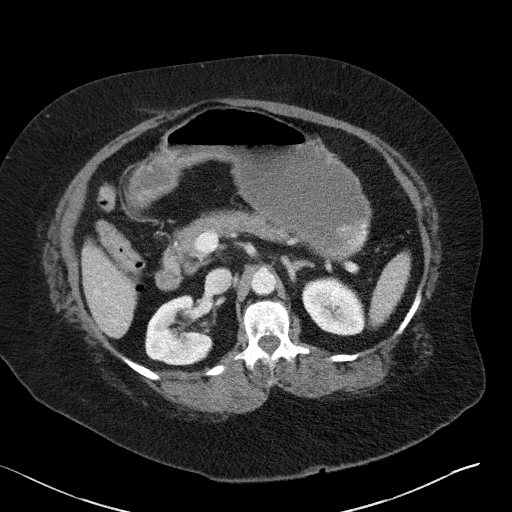
[im 59/85  bone]
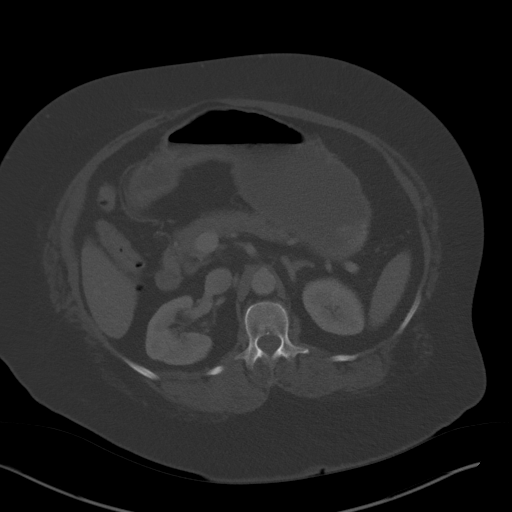
[im 65/85  soft-tissue]
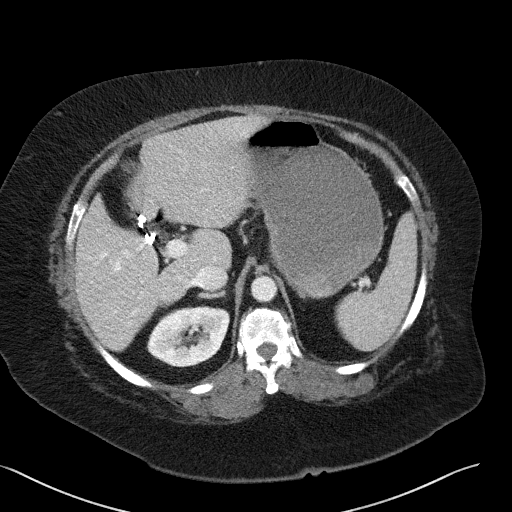
[im 72/85  soft-tissue]
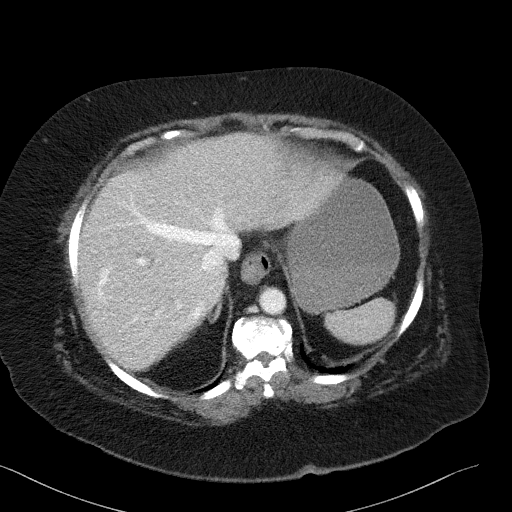
[im 78/85  soft-tissue]
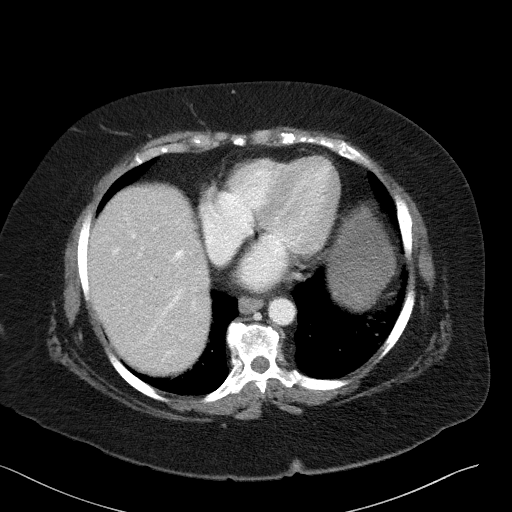

[Series 6: coronal st · coronal · 0.74mm/px · 3 of 112 slices shown]
[im 38/112  soft-tissue]
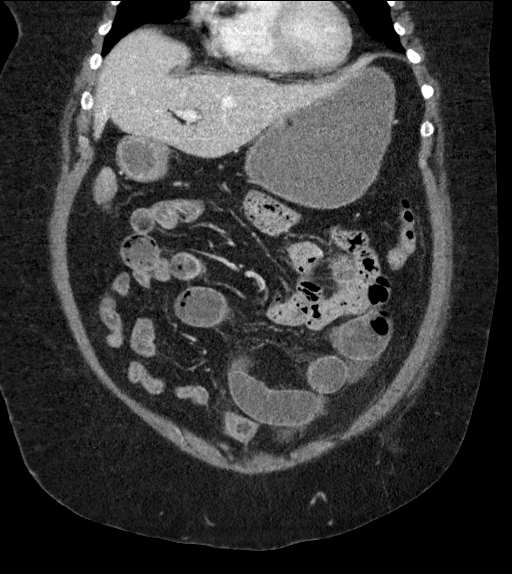
[im 50/112  soft-tissue]
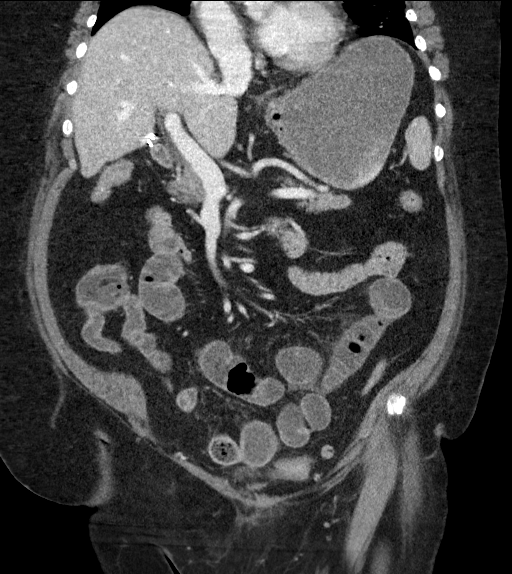
[im 62/112  soft-tissue]
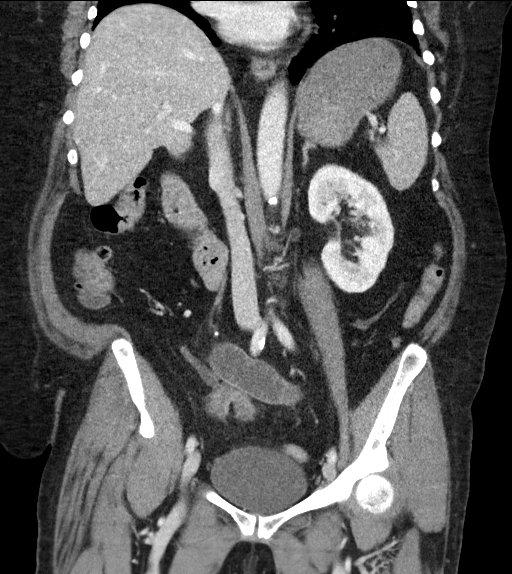

[15 of 46 positions shown; findings below may reference images not displayed]

FINDINGS: Lower chest: There is bibasilar atelectatic change, more on the left
than on the right. There is no frank airspace consolidation in the
bases. There is a small hiatal hernia.

Hepatobiliary: No focal liver lesions are evident. The gallbladder
is absent. There is no biliary duct dilatation.

Pancreas: No pancreatic mass or inflammatory focus.

Spleen: No splenic lesions are evident.

Adrenals/Urinary Tract: Adrenals bilaterally appear unremarkable.
Kidneys bilaterally show no evident mass or hydronephrosis on either
side. No evident renal or ureteral calculus on either side. Urinary
bladder is midline with wall thickness within normal limits.

Stomach/Bowel: The stomach is mildly distended with fluid and air.
There is dilatation of much of the small bowel with transition zone
in the anterior right abdomen in the proximal ileal region
consistent with a degree of small bowel obstruction. Terminal ileum
appears normal. There is no free air or portal venous air. No
evident diverticulitis. Colon is largely decompressed.

Vascular/Lymphatic: There is aortic and iliac artery
atherosclerosis. No aneurysm evident. No adenopathy evident in the
abdomen or pelvis.

Reproductive: Uterus absent.  No evident pelvic mass.

Other: Appendix appears normal. No abscess or ascites is evident in
the abdomen or pelvis.

Musculoskeletal: There is degenerative change in the lower thoracic
and lumbar regions. There are no blastic or lytic bone lesions.
There is no intramuscular or abdominal wall lesions.
IMPRESSION: 1. Small bowel obstruction with evidence of transition zone and
proximal ileal region. No free air. Terminal ileum appears normal.

2.  Small hiatal hernia.

3.  No abscess in the abdomen or pelvis.  Appendix appears normal.

4.  Gallbladder absent.  Uterus absent.

5. No renal or ureteral calculus. No hydronephrosis. Urinary bladder
wall thickness within normal limits.

## 2020-06-28 IMAGING — CR DG CHEST 1V PORT SAME DAY
1 series · 1 of 1 positions shown · non-contrast
Comparison: 12/04/2018

CLINICAL DATA: Nasogastric tube placement.

EXAM:
PORTABLE CHEST 1 VIEW

[ap]
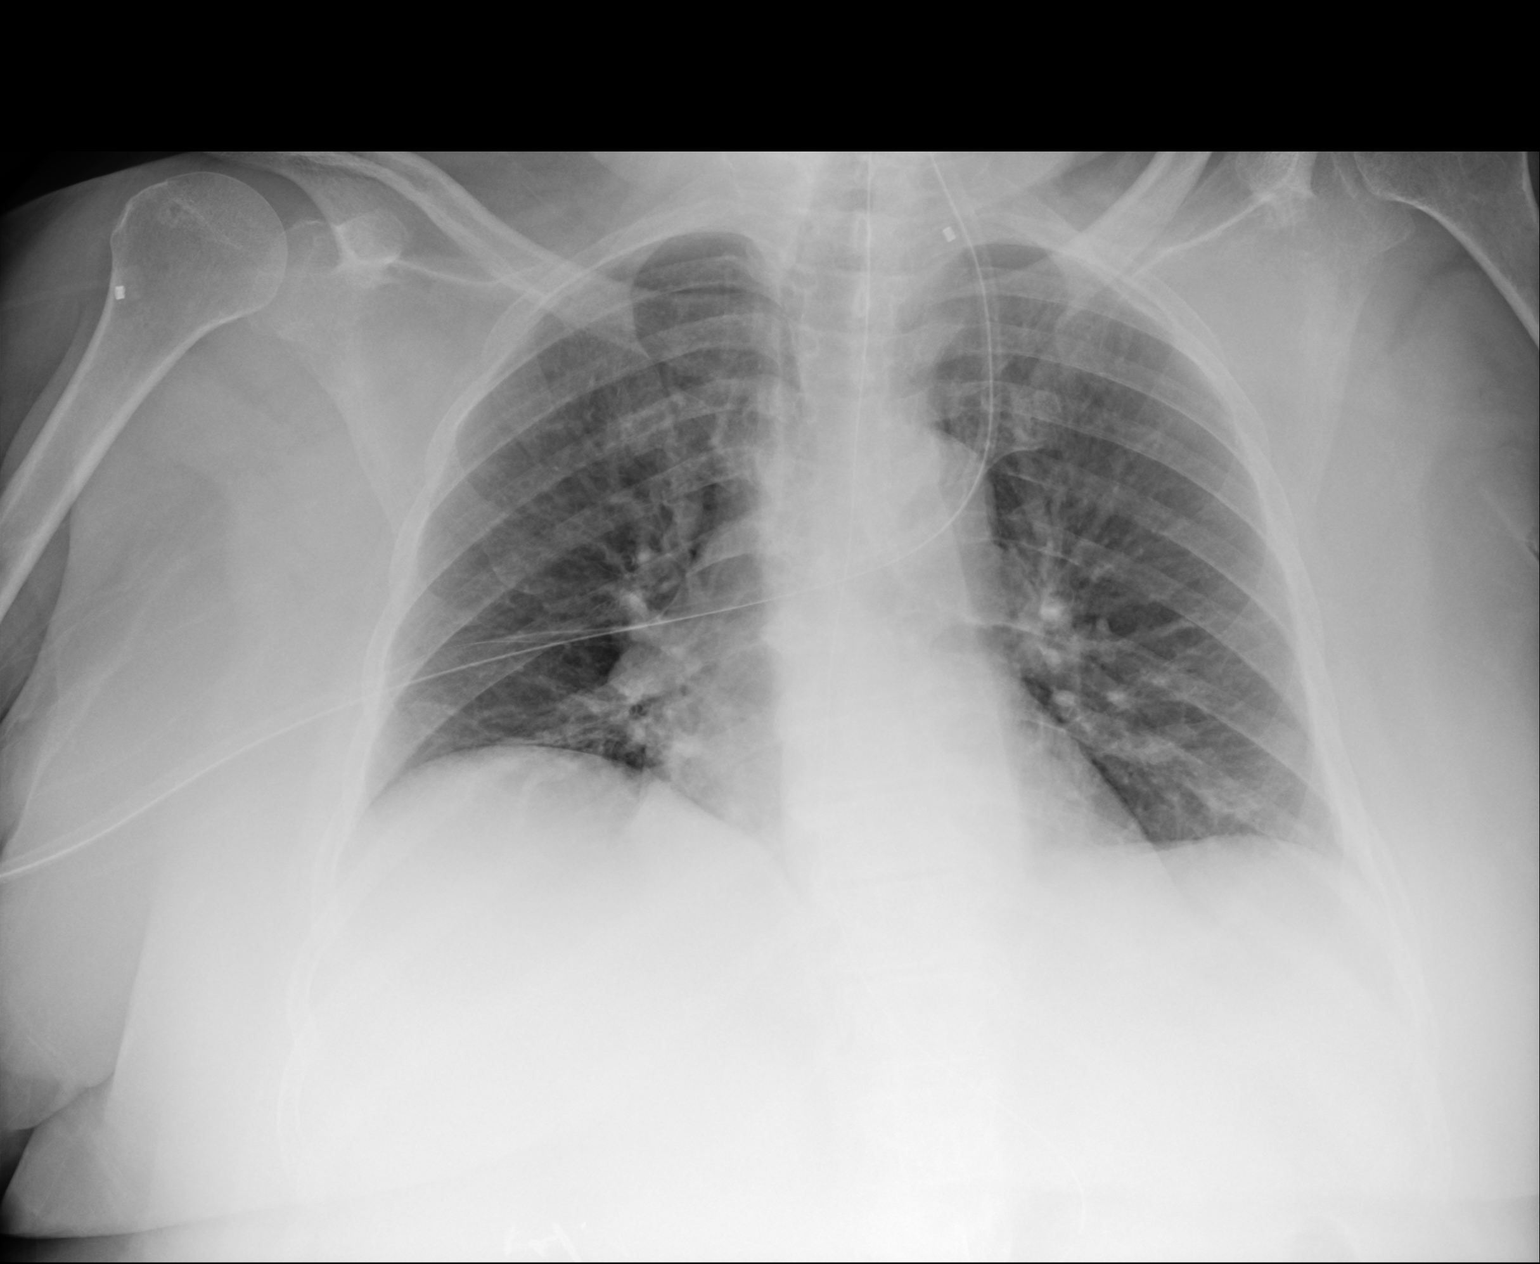

[1 of 1 positions shown; findings below may reference images not displayed]

FINDINGS: Nasogastric tube extends into the abdomen. The tip of the tube is
beyond the image but the side hole appears to be in the gastric body
region. Few densities at the right lung base are suggestive for
atelectasis. Otherwise, the lungs are clear. Heart size is within
normal limits. Negative for a pneumothorax.
IMPRESSION: Nasogastric tube extends into the stomach.

## 2020-06-28 IMAGING — CR DG ABDOMEN 2V
2 series · 2 of 2 positions shown · non-contrast
Comparison: Abdominal CT 06/28/2017

CLINICAL DATA: Acute abdominal swelling

EXAM:
ABDOMEN - 2 VIEW

[erect ap]
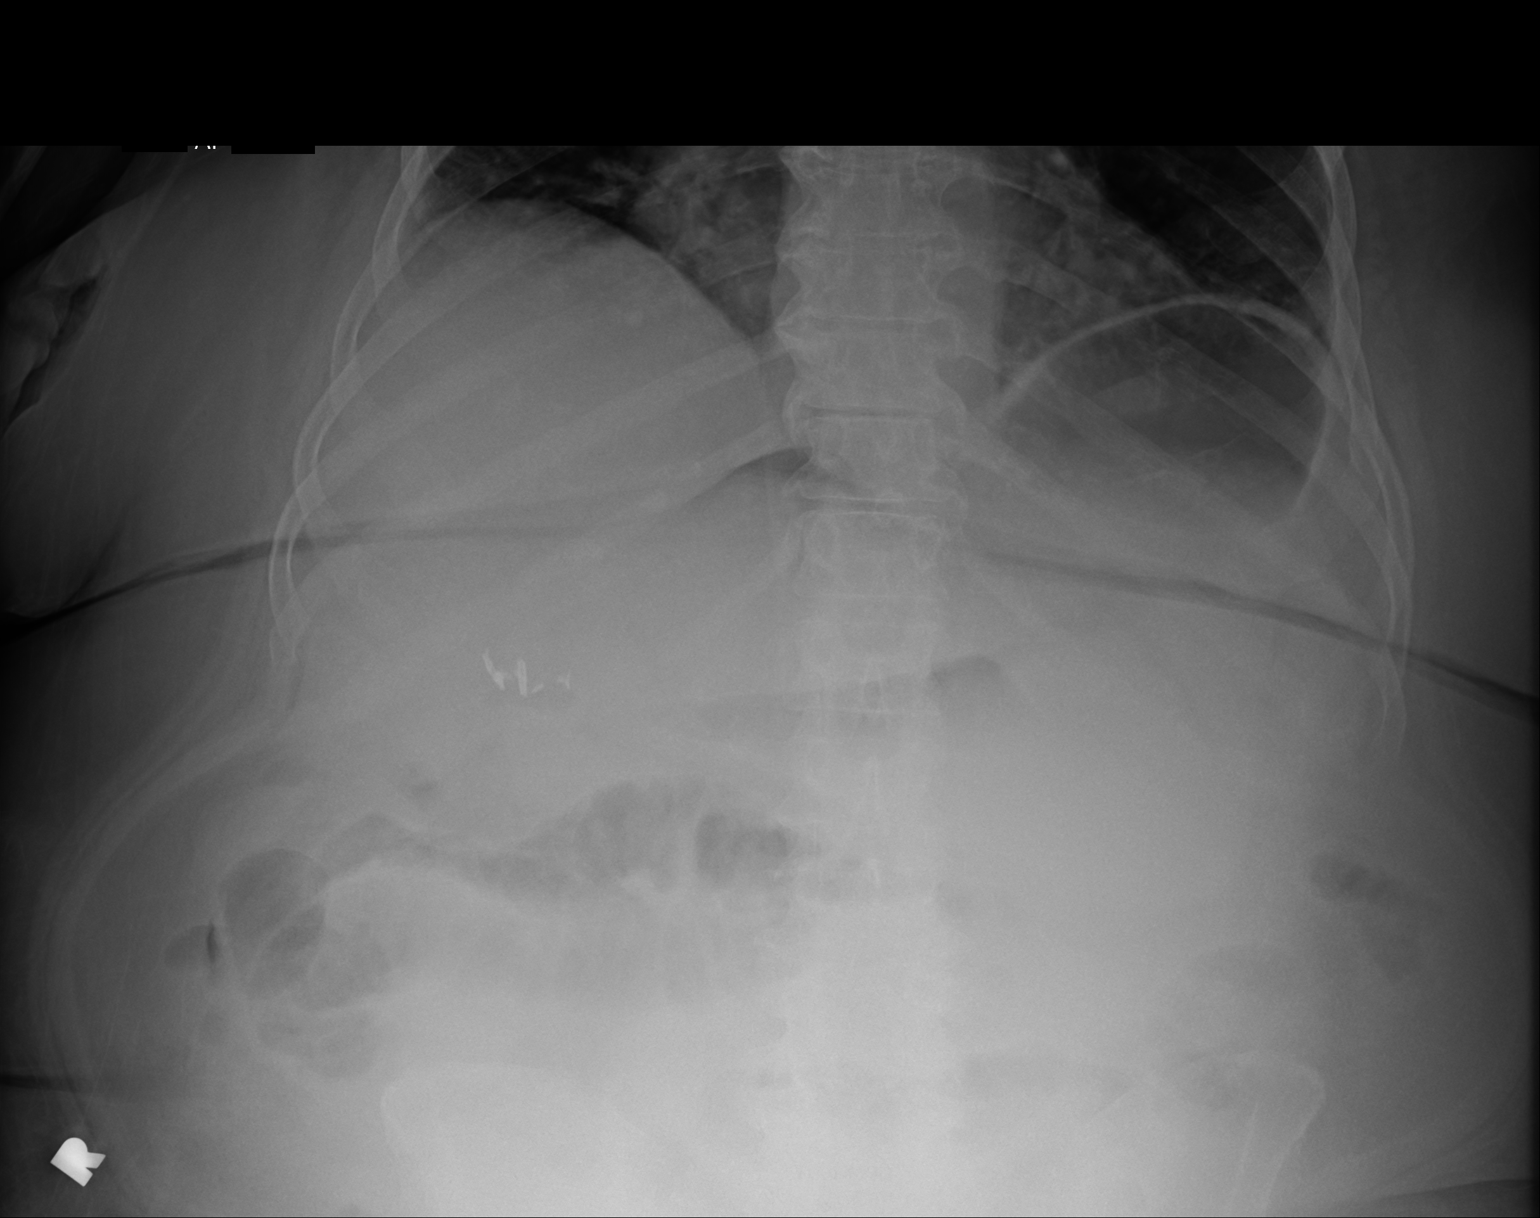

[supine ap]
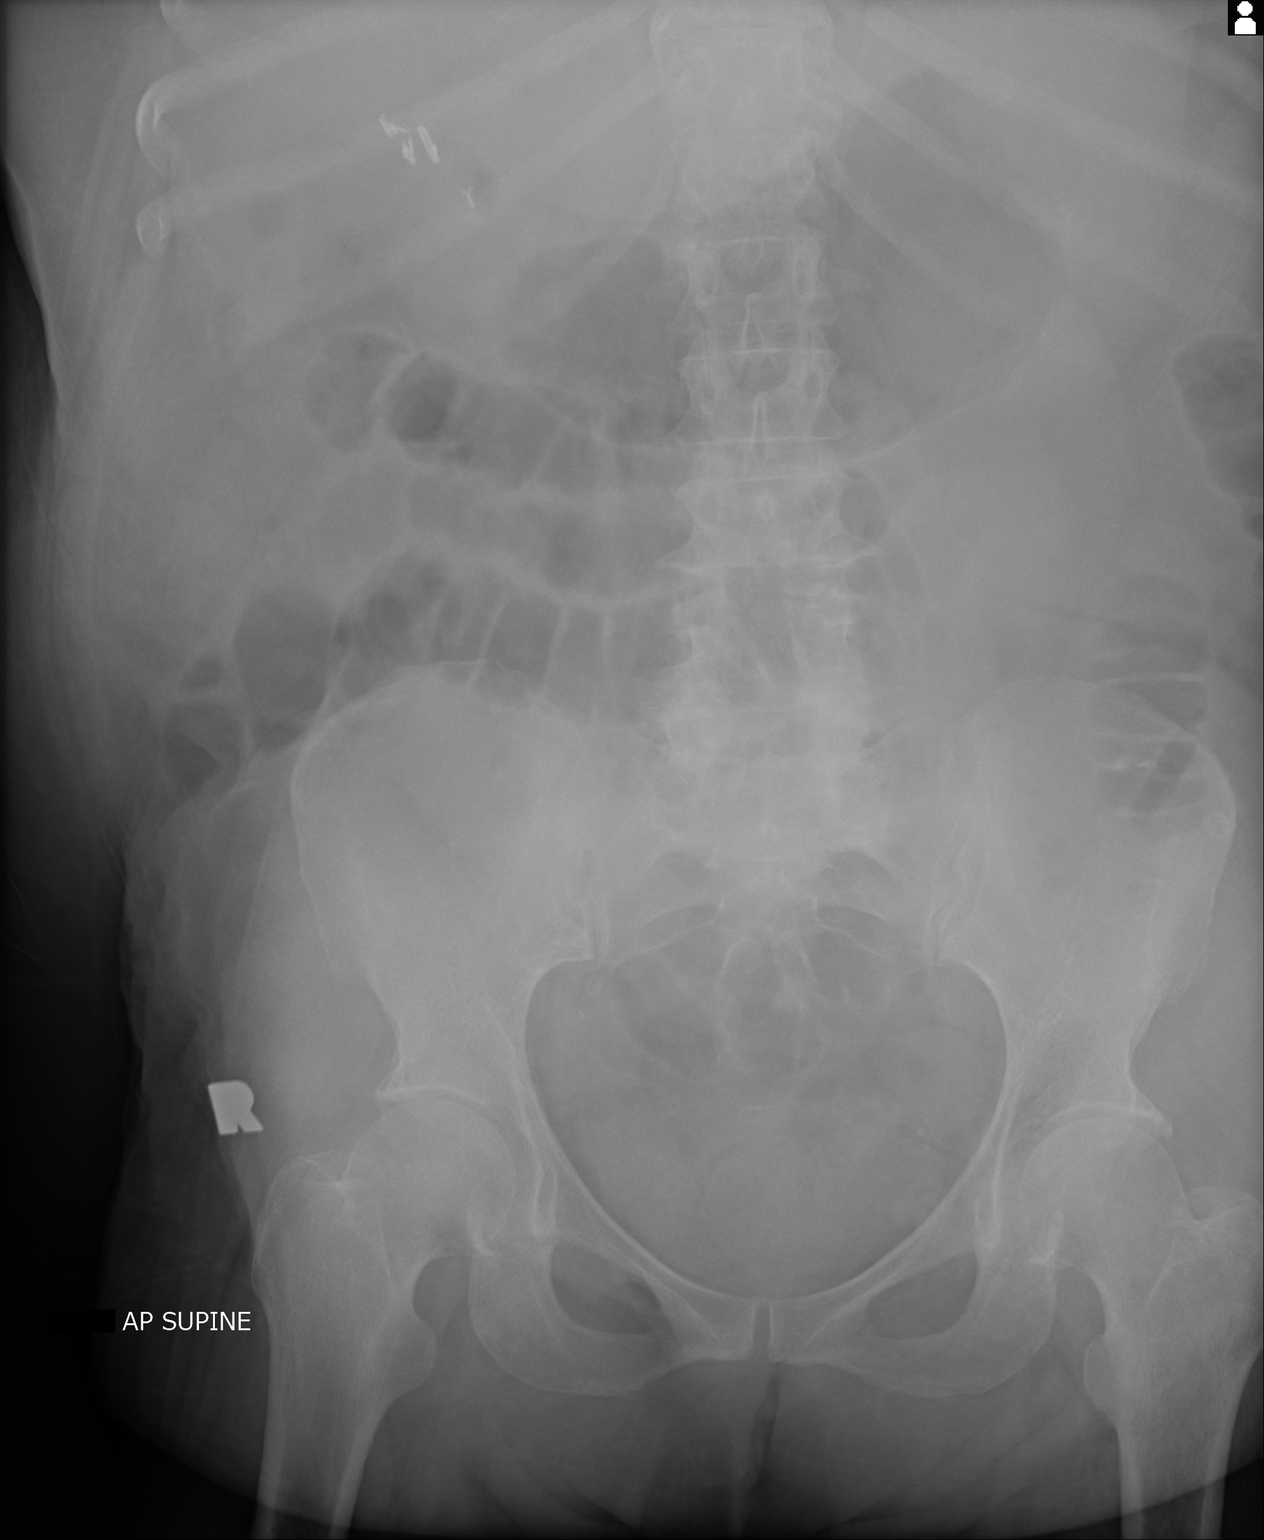

[2 of 2 positions shown; findings below may reference images not displayed]

FINDINGS: Gas distended stomach and small bowel loops with a paucity of
colonic gas. Small bowel loops measure up to 3.5 cm diameter. No
convincing pneumoperitoneum when allowing for probable gastric gas
at the level of the epigastrium. Cholecystectomy clips.
IMPRESSION: Gastric and small bowel distension favoring obstruction.

## 2020-06-30 IMAGING — DX DG ABDOMEN 2V
3 series · 3 of 3 positions shown · non-contrast
Comparison: Abdominal x-ray 12/05/2018

CLINICAL DATA: ABD pain and distention x several days. Hx
cholecystectomy, hysterectomy

EXAM:
ABDOMEN - 2 VIEW

[abdomen erect]
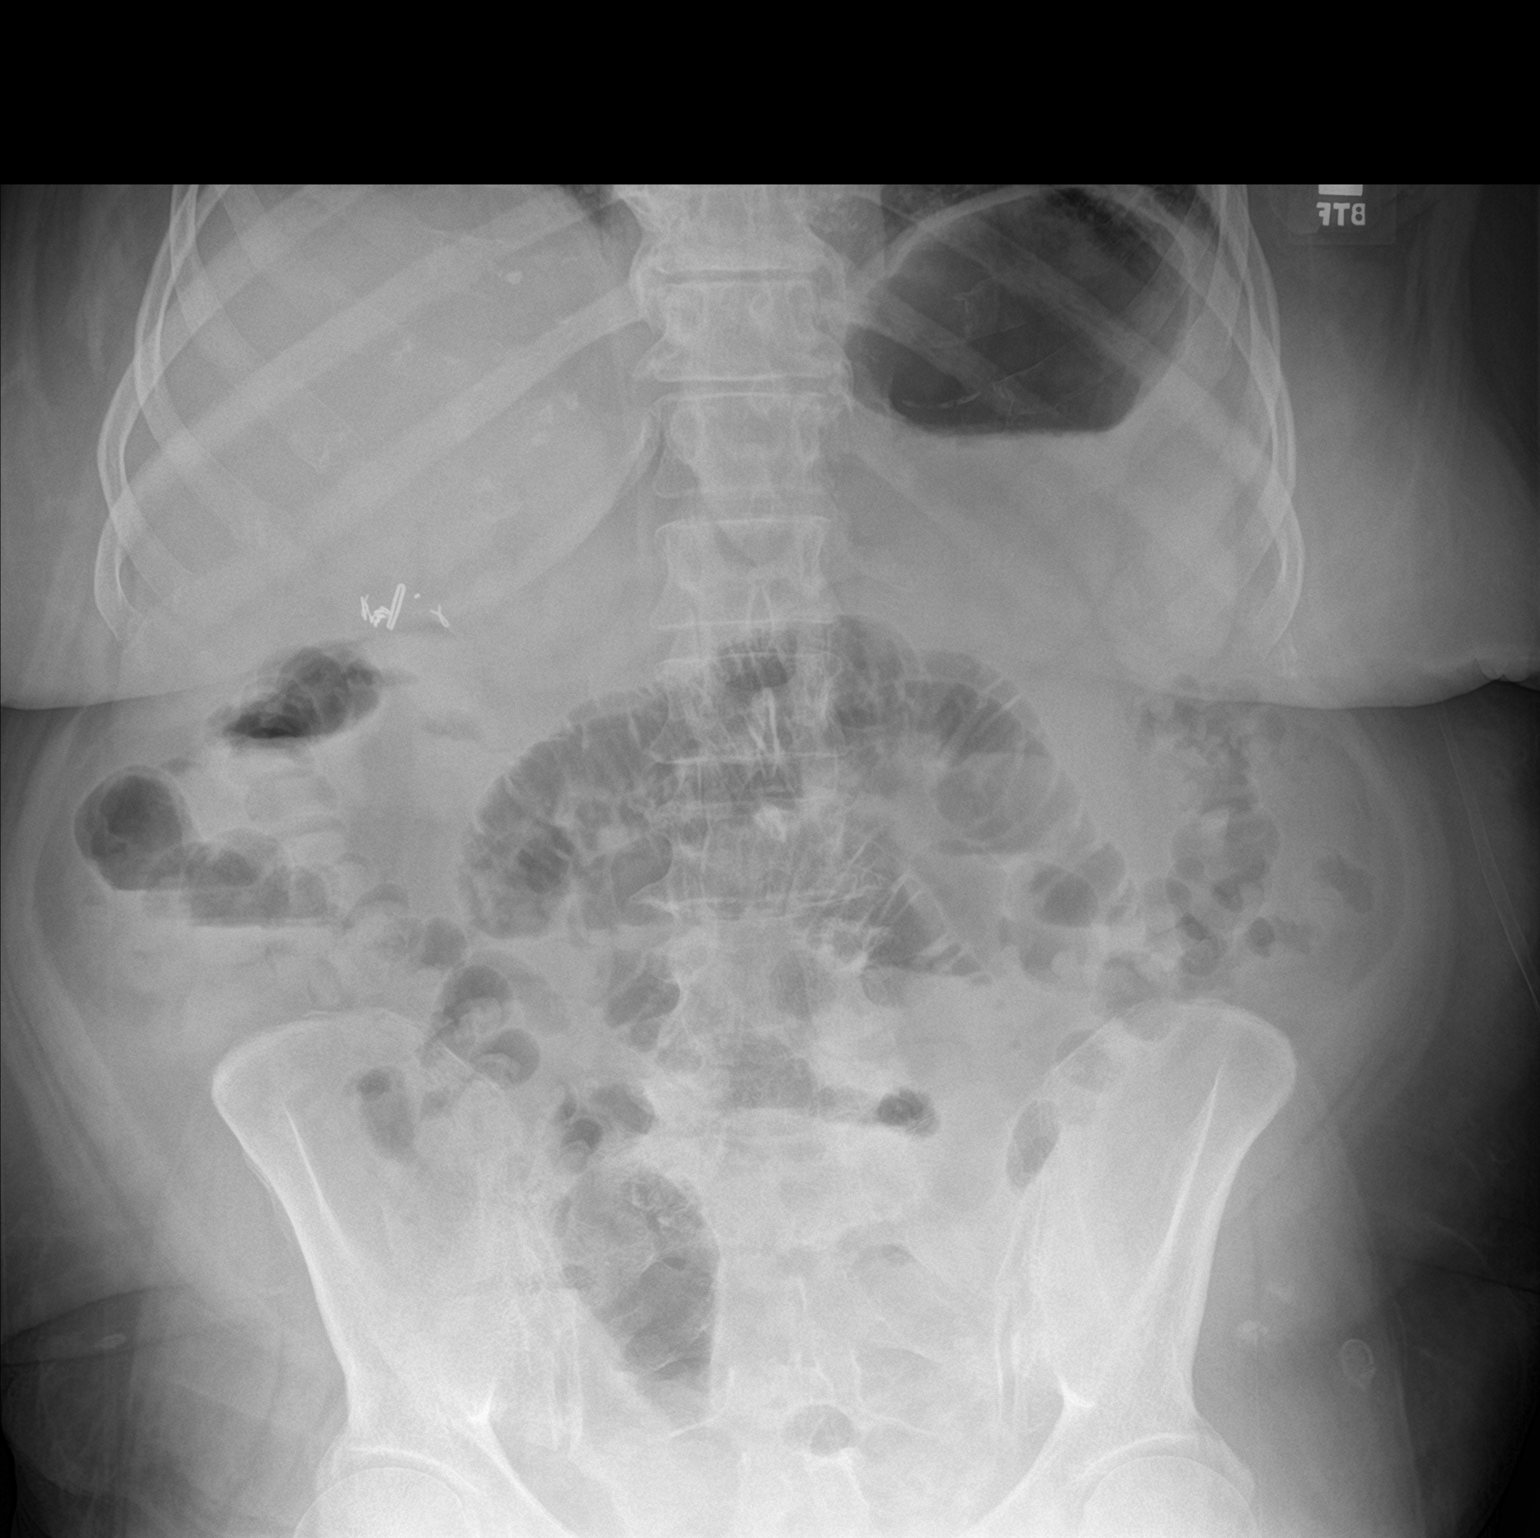

[abdomen supine (1 of 2)]
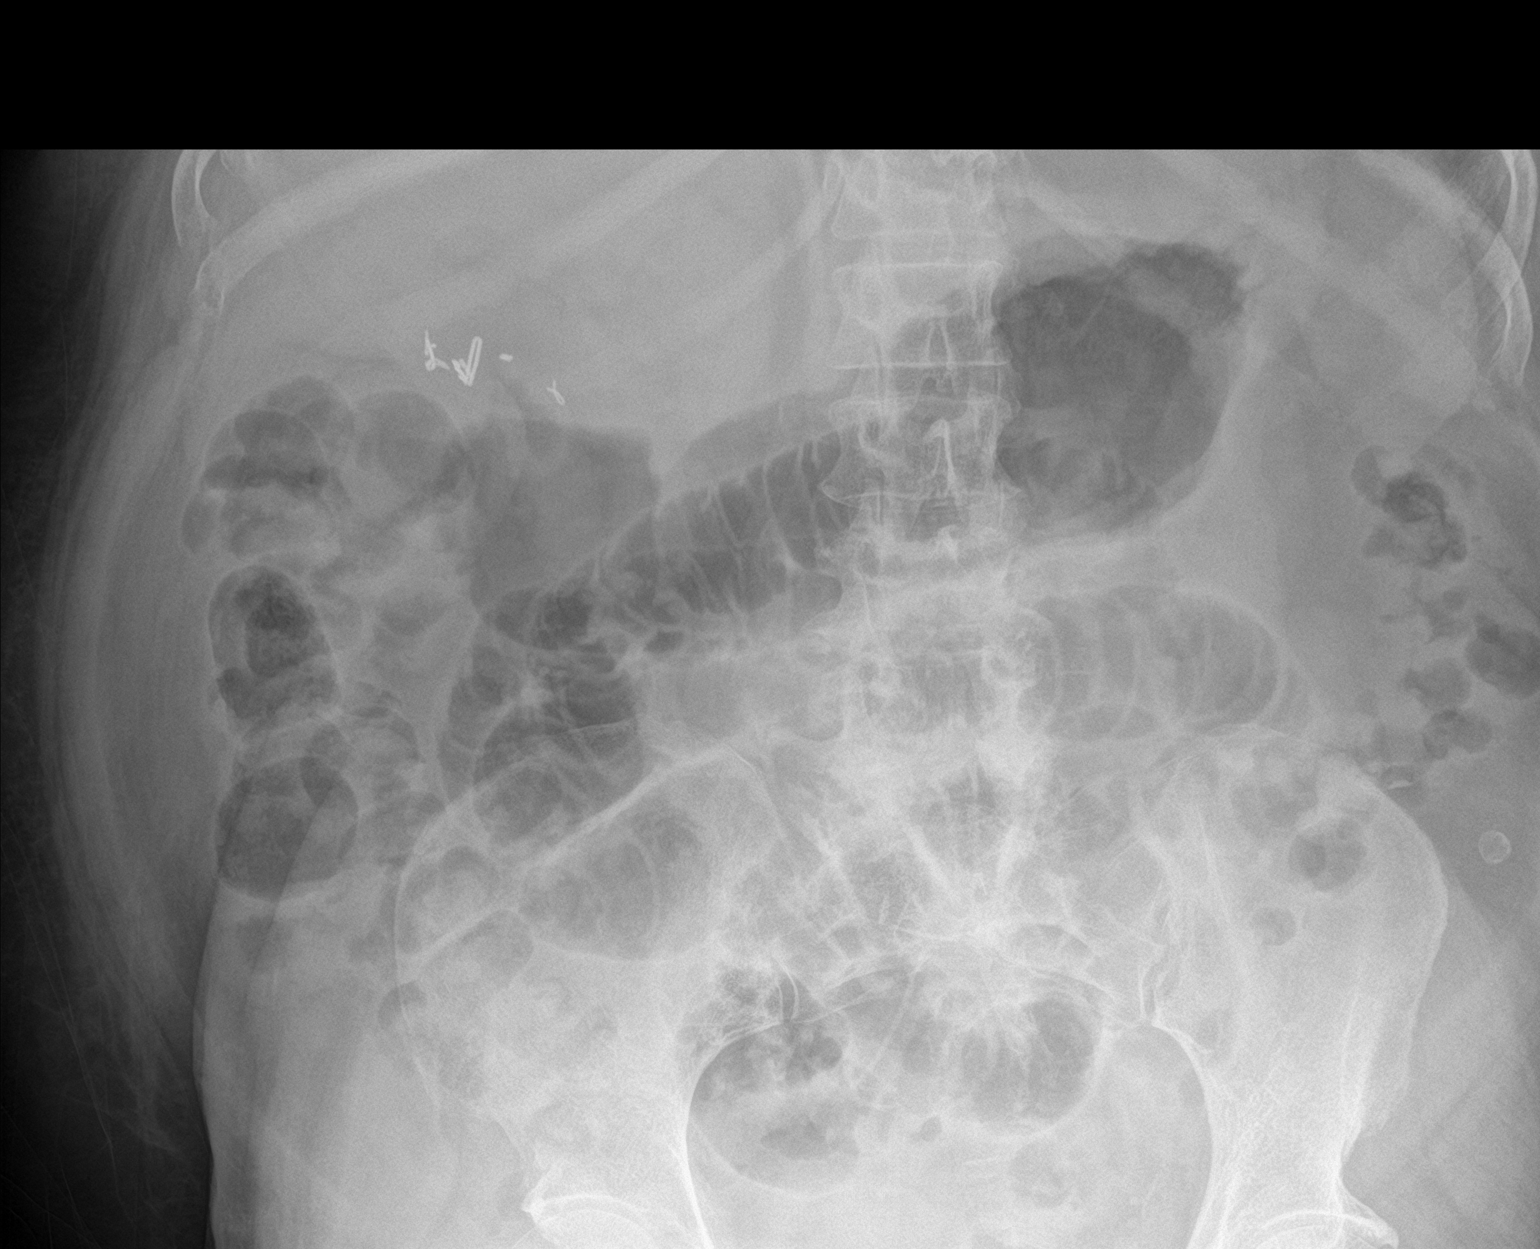

[abdomen supine (2 of 2)]
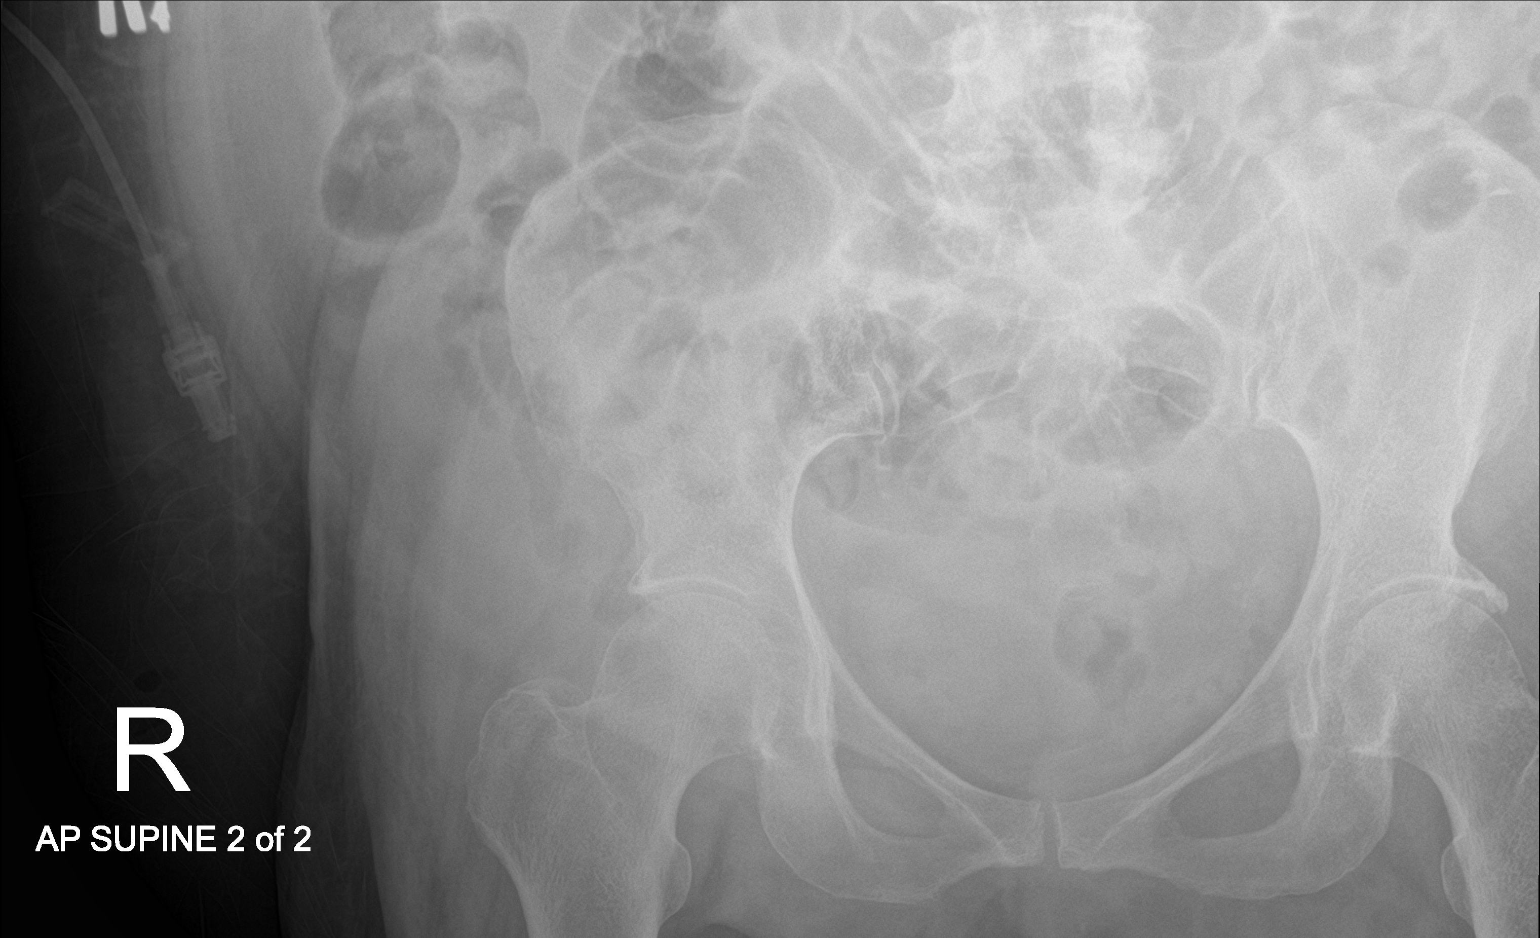

[3 of 3 positions shown; findings below may reference images not displayed]

FINDINGS: Persistent mildly dilated loops of small bowel in the mid abdomen
with gas distally similar to the prior study. No evidence of free
air. No acute finding in the visualized skeleton.
IMPRESSION: Persistent mildly dilated loops of small bowel with gas distally,
similar to the prior study, may represent resolving small-bowel
obstruction.

## 2020-07-01 ENCOUNTER — Telehealth (INDEPENDENT_AMBULATORY_CARE_PROVIDER_SITE_OTHER): Payer: Medicare HMO | Admitting: Psychiatry

## 2020-07-01 ENCOUNTER — Other Ambulatory Visit: Payer: Self-pay

## 2020-07-01 ENCOUNTER — Encounter (HOSPITAL_COMMUNITY): Payer: Self-pay | Admitting: Psychiatry

## 2020-07-01 DIAGNOSIS — F331 Major depressive disorder, recurrent, moderate: Secondary | ICD-10-CM

## 2020-07-01 IMAGING — CR DG ABD PORTABLE 1V
2 series · 2 of 2 positions shown · non-contrast
Comparison: 12/06/2018

CLINICAL DATA: SBO, Scout film for SB with 8 hour film

EXAM:
PORTABLE ABDOMEN - 1 VIEW

[supine ap (1 of 2)]
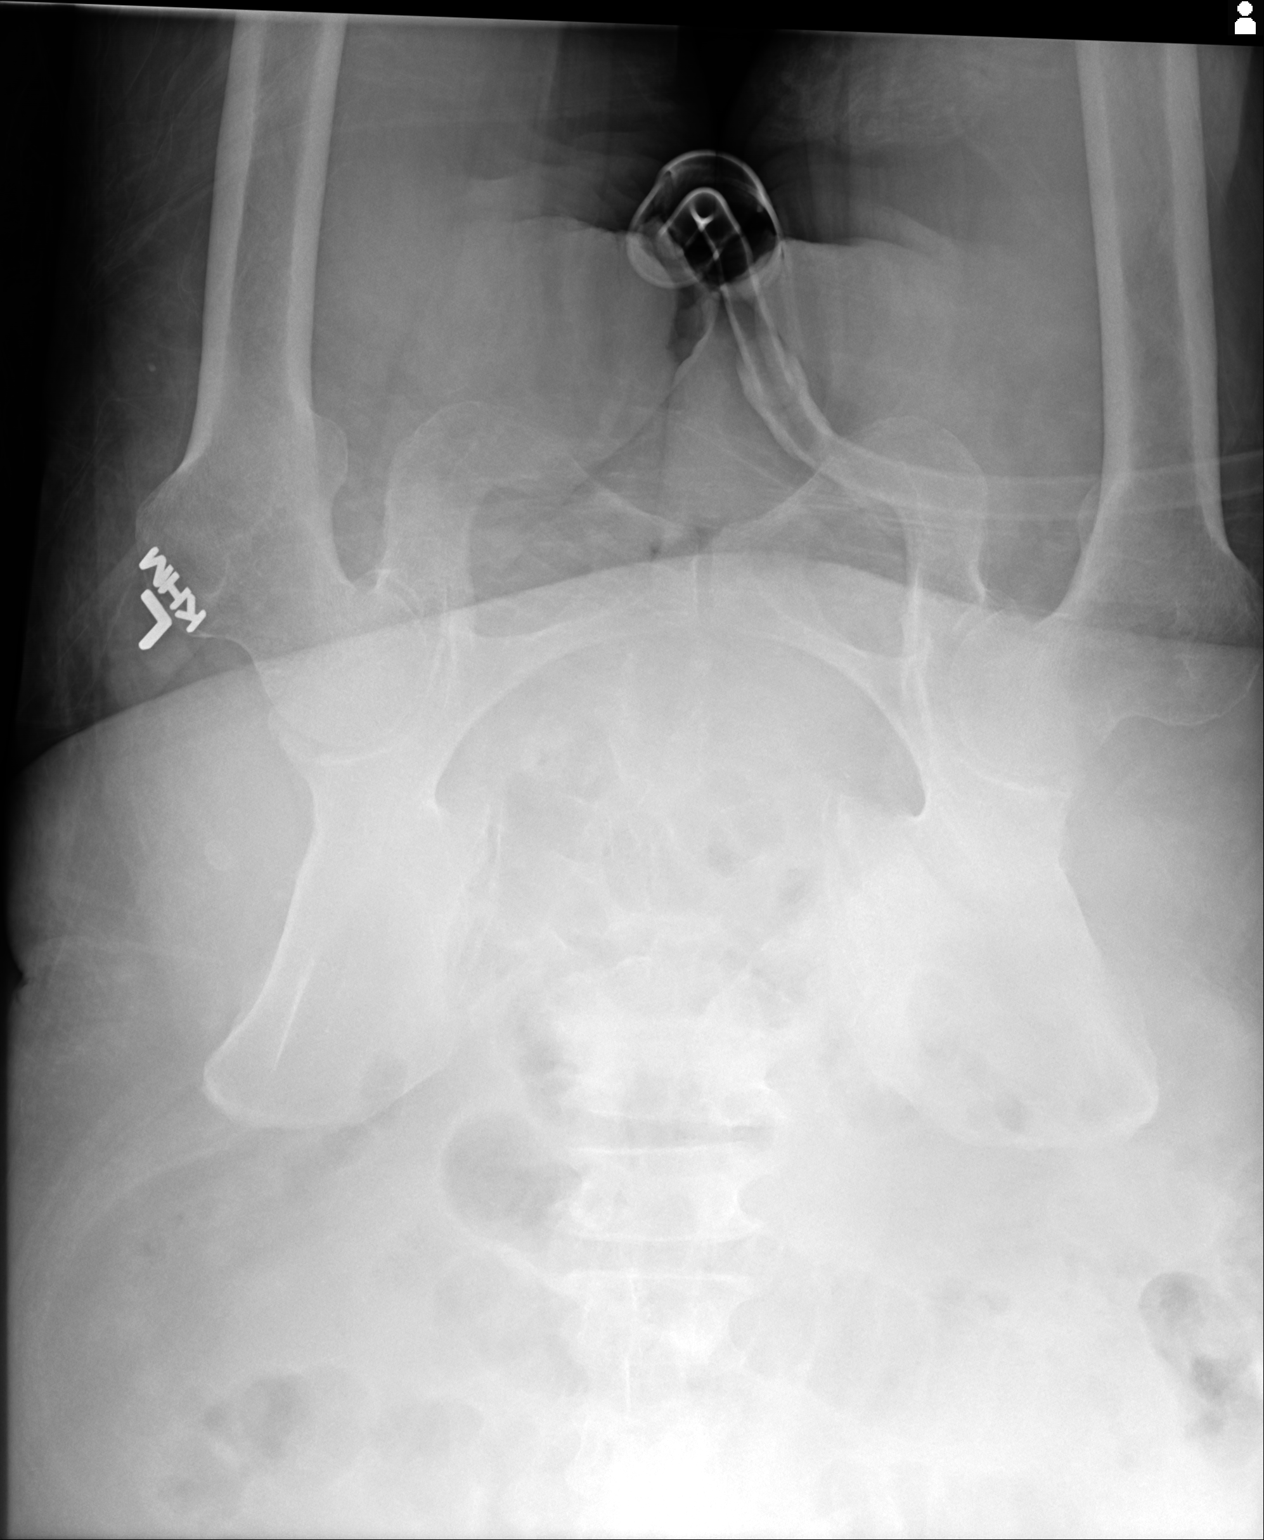

[supine ap (2 of 2)]
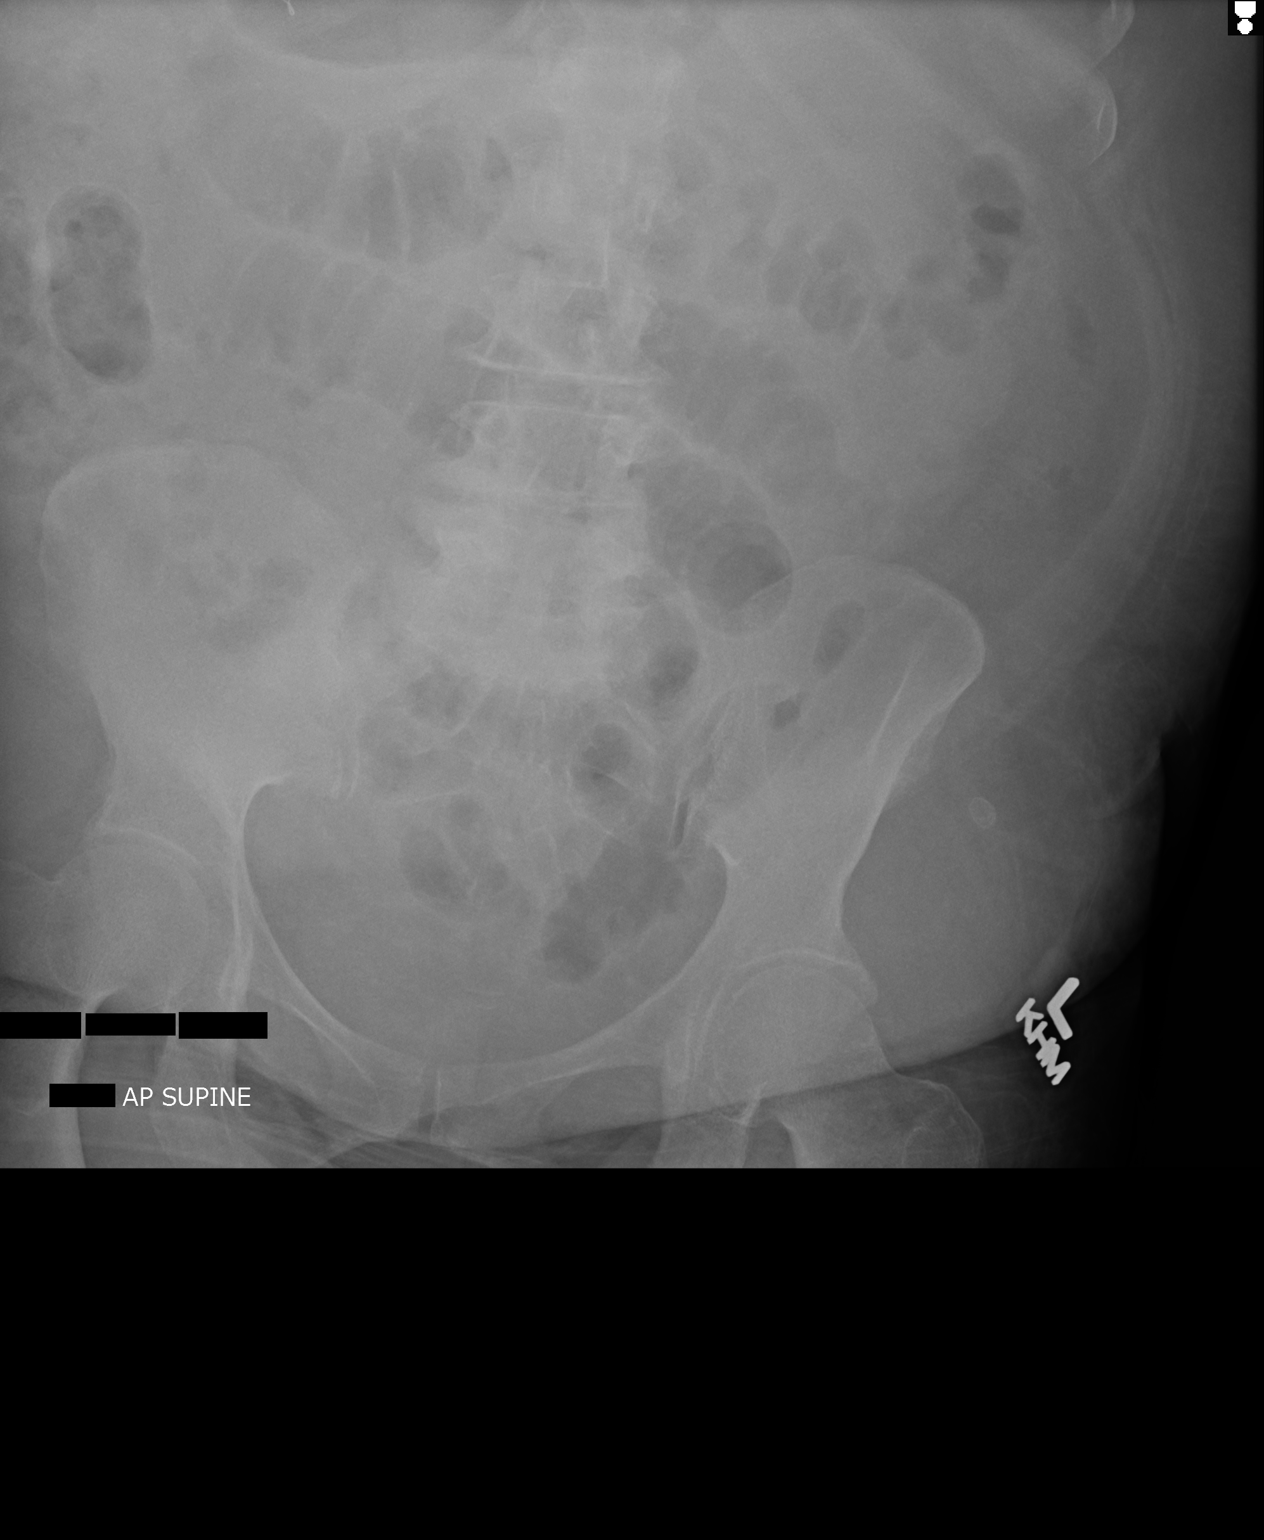

[2 of 2 positions shown; findings below may reference images not displayed]

FINDINGS: Mildly dilated loops of small bowel in the mid abdomen measuring
cm similar to prior. Gas appears within the rectum.
IMPRESSION: Small bowel obstruction versus ileus pattern.

## 2020-07-01 IMAGING — CR DG ABD PORTABLE 1V
2 series · 2 of 2 positions shown · non-contrast
Comparison: Radiograph earlier this day. CT 12/04/2018

CLINICAL DATA: Small bowel obstruction. 8 hour delayed film.

EXAM:
PORTABLE ABDOMEN - 1 VIEW

[supine ap (1 of 2)]
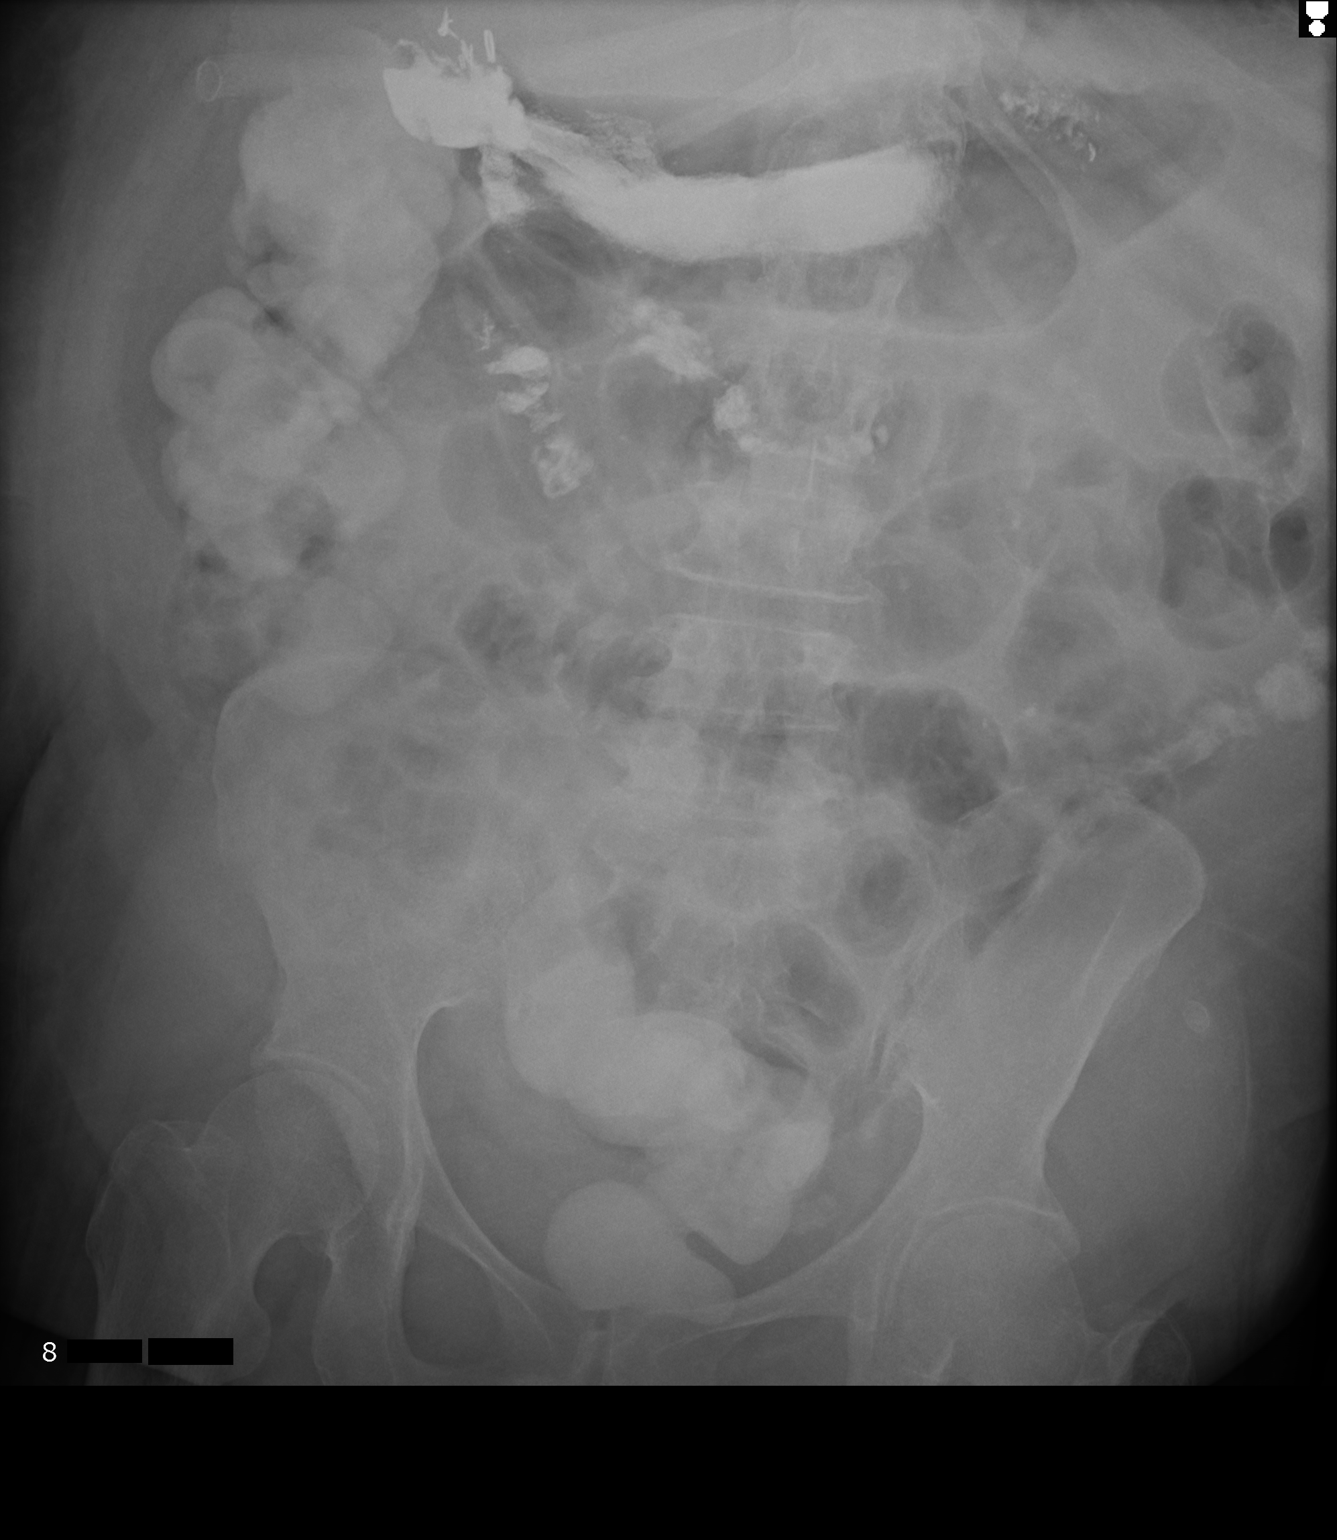

[supine ap (2 of 2)]
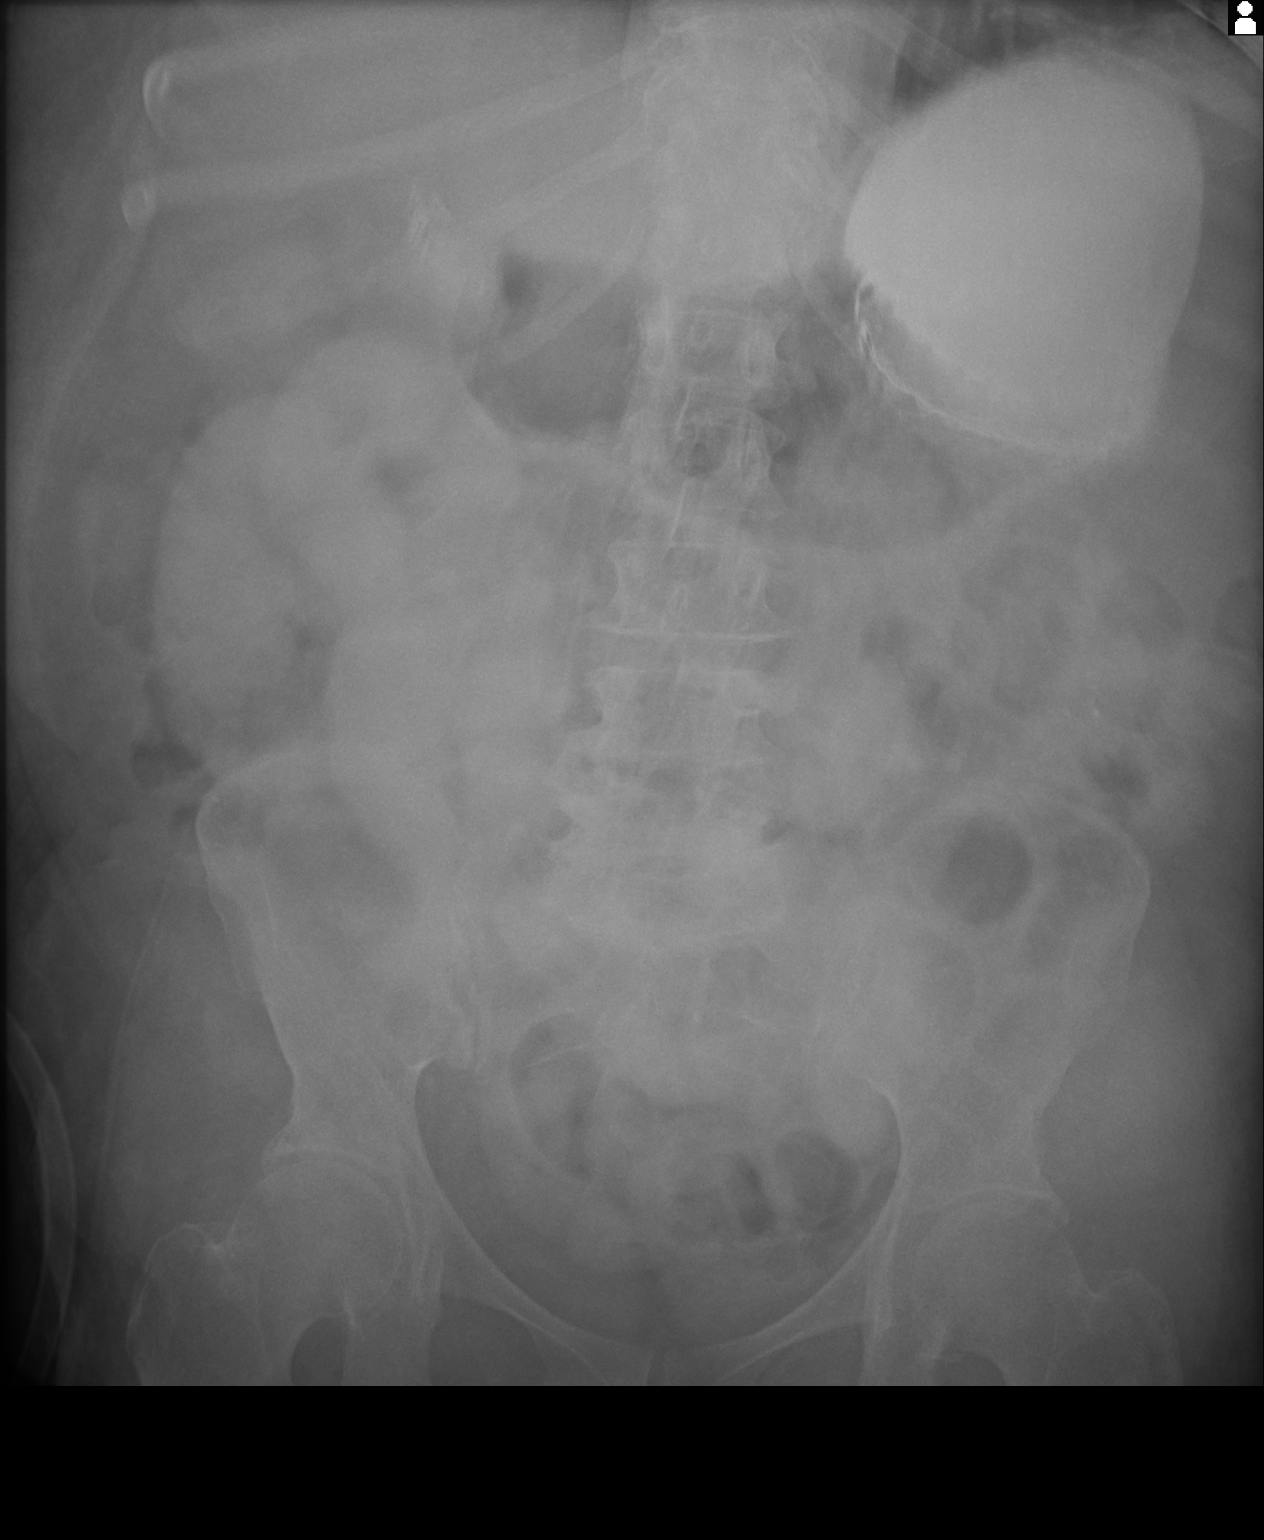

[2 of 2 positions shown; findings below may reference images not displayed]

FINDINGS: 1 hour delayed demonstrates enteric contrast in the stomach and
small bowel. 8 hour delay demonstrates contrast has progressed to
the ascending, descending, and rectosigmoid colon. There is residual
enteric contrast in the stomach and duodenum. Persistent but
diminished small bowel distention in the central abdomen.
IMPRESSION: Enteric contrast contrast has progressed to the ascending,
descending, and rectosigmoid colon. Persistent but diminished small
bowel distention in the central abdomen.

## 2020-07-01 MED ORDER — SERTRALINE HCL 100 MG PO TABS
100.0000 mg | ORAL_TABLET | Freq: Every day | ORAL | 2 refills | Status: DC
Start: 2020-07-01 — End: 2020-10-13

## 2020-07-01 MED ORDER — TRAZODONE HCL 100 MG PO TABS: 100.0000 mg | ORAL_TABLET | Freq: Every day | ORAL | 2 refills | Status: DC

## 2020-07-01 MED ORDER — BUPROPION HCL ER (XL) 150 MG PO TB24: 150.0000 mg | ORAL_TABLET | ORAL | 2 refills | Status: DC

## 2020-07-01 MED ORDER — RISPERIDONE 0.5 MG PO TABS
0.5000 mg | ORAL_TABLET | Freq: Every day | ORAL | 2 refills | Status: DC
Start: 2020-07-01 — End: 2020-10-13

## 2020-07-01 MED ORDER — LAMOTRIGINE 100 MG PO TABS: 100.0000 mg | ORAL_TABLET | Freq: Two times a day (BID) | ORAL | 2 refills | Status: DC

## 2020-07-01 MED ORDER — LORAZEPAM 0.5 MG PO TABS: 0.5000 mg | ORAL_TABLET | Freq: Two times a day (BID) | ORAL | 0 refills | Status: DC

## 2020-07-01 NOTE — Progress Notes (Signed)
Virtual Visit via Telephone Note  I connected with [name] on 07/01/20 at  4:00 PM EDT by telephone and verified that I am speaking with the correct person using two identifiers.  Location: Patient: home Provider: home office   I discussed the limitations, risks, security and privacy concerns of performing an evaluation and management service by telephone and the availability of in person appointments. I also discussed with the patient that there may be a patient responsible charge related to this service. The patient expressed understanding and agreed to proceed.    I discussed the assessment and treatment plan with the patient. The patient was provided an opportunity to ask questions and all were answered. The patient agreed with the plan and demonstrated an understanding of the instructions.   The patient was advised to call back or seek an in-person evaluation if the symptoms worsen or if the condition fails to improve as anticipated.  I provided 15 minutes of non-face-to-face time during this encounter.   Levonne Spiller, MD  [facility] MD/PA/NP OP Progress Note  07/01/2020 4:18 PM [name]  MRN:  [id number]  Chief Complaint:  Chief Complaint    Anxiety; Depression; Follow-up     HPI: This patient is a 70 year old divorced black female who lives alone in [place]. 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  She states that she is frustrated because she had broken her ankle back in December but is not healing that well.  She is now using a bone stimulator.  Also her home health aide that came in the morning is quit and she does not have another one coming until 1 PM.  She is trying to do a lot for herself around the apartment in the mornings.  I warned her to curtail this so she does not fall again.  Overall her mood has been fairly stable and she denies significant depression or anxiety.  However she  is dealing with a lot of chronic pain Visit Diagnosis:    ICD-10-CM   1. Major depressive disorder, recurrent episode, moderate (HCC)  F33.1     Past Psychiatric History: Numerous hospitalizations for depression years ago including ECT treatment  Past Medical History:  Past Medical History:  Diagnosis Date  . Allergy   . Anemia   . Anxiety    takes Ativan daily  . Arthritis   . Assistance needed for mobility   . Bipolar disorder ([place])    takes Risperdal nightly  . Blood transfusion   . Brain tumor ([place])   . Cancer ([place])    In her gum  . Carpal tunnel syndrome of right wrist 05/23/2011  . Cervical disc disorder with radiculopathy of cervical region 10/31/2012  . Chronic back pain   . Chronic idiopathic constipation   . Chronic neck and back pain   . Colon polyps   . COPD (chronic obstructive pulmonary disease) with chronic bronchitis ([place]) 09/16/2013   Office Spirometry 10/30/2013-submaximal effort based on appearance of loop and curve. Numbers would fit with severe restriction but her physiologic capability may be better than this. FVC 0.91/44%, and 10.74/45%, FEV1/FVC 0.81, FEF 25-75% 1.43/69%    . Diabetes mellitus    Type II  . Diverticulosis    TCS 9/08 by Dr. Delfin Edis for diarrhea . Bx for micro scopic colitis negative.   . Fibromyalgia   . Frequent falls   . GERD (  gastroesophageal reflux disease)    takes Aciphex daily  . Glaucoma    eye drops daily  . Gum symptoms    infection on antibiotic  . Hemiplegia affecting non-dominant side, post-stroke 08/02/2011  . Hiatal hernia   . Hyperlipidemia    takes Crestor daily  . Hypertension    takes Amlodipine,Metoprolol,and Clonidine daily  . Hypothyroidism    takes Synthroid daily  . IBS (irritable bowel syndrome)   . Insomnia    takes Trazodone nightly  . Major depression, recurrent (HCC)    takes Zoloft daily  . 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 Metabolic encephalopathy 08/03/2011  . Migraines    chronic  headaches  . Mononeuritis lower limb   . Narcolepsy   . Osteoporosis   . Pancreatitis 2006   due to Depakote with normal EUS   . Schatzki's ring    non critical / EGD with ED 8/2011with RMR  . Seizures (HCC)    takes Lamictal daily.Last seizure 3 yrs ago  . Sleep apnea    on CPAP  . Small bowel obstruction (HCC)   . Stroke (HCC)    left sided weakness, speech changes  . Tubular adenoma of colon     Past Surgical History:  Procedure Laterality Date  . ABDOMINAL HYSTERECTOMY  1978  . BACK SURGERY  July 2012  . BACTERIAL OVERGROWTH TEST N/A 05/05/2013   Procedure: BACTERIAL OVERGROWTH TEST;  Surgeon: Robert M Rourk, MD;  Location: [facility] ENDO SUITE;  Service: Endoscopy;  Laterality: N/A;  7:30  . BIOPSY THYROID  2009  . BRAIN SURGERY  11/2011   resection of meningioma  . BREAST REDUCTION SURGERY  1994  . CARDIAC CATHETERIZATION  05/10/2005   normal coronaries, normal LV systolic function and EF (Dr. J. Gangi)  . CARPAL TUNNEL RELEASE Left 07/22/04   Dr. Harrison  . CATARACT EXTRACTION Bilateral   . CHOLECYSTECTOMY  1984  . COLONOSCOPY N/A 09/25/2012   RMR:Colonic diverticulosis.  colonic polyp-removed : tubular adenoma  . CRANIOTOMY  11/23/2011   Procedure: CRANIOTOMY TUMOR EXCISION;  Surgeon: Robert W Nudelman, MD;  Location: [facility] NEURO ORS;  Service: Neurosurgery;  Laterality: N/A;  Craniotomy for tumor resection  . ESOPHAGOGASTRODUODENOSCOPY  12/29/2010   Rourk-Retained food in the esophagus and stomach, small hiatal hernia, status post Maloney dilation of the esophagus  . ESOPHAGOGASTRODUODENOSCOPY N/A 09/25/2012   RMR:Somewhat atonic baggy esophagus status post Maloney dilation 56 F. Hiatal hernia  . GIVENS CAPSULE STUDY N/A 01/15/2013   NORMAL.   . IR GENERIC HISTORICAL  03/17/2016   IR RADIOLOGIST EVAL & MGMT 03/17/2016 [facility]-INTERV RAD  . LESION REMOVAL N/A 05/31/2015   Procedure: REMOVAL RIGHT AND LEFT LESIONS OF MANDIBLE;  Surgeon: Scott Jensen, DDS;  Location: [facility] OR;  Service: Oral  Surgery;  Laterality: N/A;  . MALONEY DILATION  12/29/2010   RMR;  . NM MYOCAR PERF WALL MOTION  2006   "relavtiely normal" persantine, mild anterior thinning (breast attenuation artifact), no region of scar/ischemia  . OVARIAN CYST REMOVAL    . RECTOCELE REPAIR N/A 06/29/2015   Procedure: POSTERIOR REPAIR (RECTOCELE);  Surgeon: John Ferguson V, MD;  Location: [facility] ORS;  Service: Gynecology;  Laterality: N/A;  . REDUCTION MAMMAPLASTY Bilateral   . SPINE SURGERY  09/29/2010   Dr. Brooks  . surgical excision of 3 tumors from right thigh and right buttock  and left upper thigh  2010  . TOOTH EXTRACTION Bilateral 12/14/2014   Procedure: REMOVAL OF BILATERAL MANDIBULAR EXOSTOSES;  Surgeon: Scott   Jensen, DDS;  Location: [facility] OR;  Service: Oral Surgery;  Laterality: Bilateral;  . TRANSTHORACIC ECHOCARDIOGRAM  2010   EF 60-65%, mild conc LVH, grade 1 diastolic dysfunction; mildly calcified MV annulus with mildly thickened leaflets, mildly calcified MR annulus    Family Psychiatric History: see below  Family History:  Family History  Problem Relation Age of Onset  . Heart attack Mother        HTN  . Pneumonia Father   . Kidney failure Father   . Diabetes Father   . Pancreatic cancer Sister   . Diabetes Brother   . Hypertension Brother   . Diabetes Brother   . Cancer Sister        breast   . Hypertension Son   . Sleep apnea Son   . Cancer Sister        pancreatic  . Stroke Maternal Grandmother   . Heart attack Maternal Grandfather   . Alcohol abuse Maternal Uncle   . Colon cancer Neg Hx   . Anesthesia problems Neg Hx   . Hypotension Neg Hx   . Malignant hyperthermia Neg Hx   . Pseudochol deficiency Neg Hx   . Breast cancer Neg Hx     Social History:  Social History   Socioeconomic History  . Marital status: Divorced    Spouse name: Not on file  . Number of children: 1  . Years of education: 12  . Highest education level: High school graduate  Occupational History  .  Occupation: Disabled  Tobacco Use  . Smoking status: Current Some Day Smoker    Packs/day: 0.50    Years: 30.00    Pack years: 15.00    Types: Cigarettes  . Smokeless tobacco: Never Used  . Tobacco comment: smokes 1 pack per day 05/31/2020  Vaping Use  . Vaping Use: Never used  Substance and Sexual Activity  . Alcohol use: No    Alcohol/week: 0.0 standard drinks    Comment:    . Drug use: No  . Sexual activity: Not Currently  Other Topics Concern  . Not on file  Social History Narrative   01/29/18 Lives alone, has 3 aides, Mon- Fri 8 hrs, 2 hrs on Sat-Sun, RN manages her meds   Caffeine use: Drink coffee sometimes    Right handed    Social Determinants of Health   Financial Resource Strain: Not on file  Food Insecurity: Not on file  Transportation Needs: No Transportation Needs  . Lack of Transportation (Medical): No  . Lack of Transportation (Non-Medical): No  Physical Activity: Not on file  Stress: Not on file  Social Connections: Not on file    Allergies:  Allergies  Allergen Reactions  . Lac Bovis Rash    Doesn't agree with stomach.   . Phenazopyridine Hcl Hives  . Cephalexin Hives  . Flonase [Fluticasone]     "It gave me ulcers in my nose"  . Iron Nausea And Vomiting  . Milk-Related Compounds Other (See Comments)    Doesn't agree with stomach.   . Penicillins Hives    Has patient had a PCN reaction causing immediate rash, facial/tongue/throat swelling, SOB or lightheadedness with hypotension: Yes Has patient had a PCN reaction causing severe rash involving mucus membranes or skin necrosis: No Has patient had a PCN reaction that required hospitalization No Has patient had a PCN reaction occurring within the last 10 years: No If all of the above answers are "NO", then may proceed with Cephalosporin use.   .   Phenazopyridine Hcl Hives          Metabolic Disorder Labs: Lab Results  Component Value Date   HGBA1C 5.3 03/25/2020   MPG 108 09/30/2018   MPG  134 10/15/2016   No results found for: PROLACTIN Lab Results  Component Value Date   CHOL 125 09/23/2019   TRIG 117 09/23/2019   HDL 45 09/23/2019   CHOLHDL 3.5 09/30/2018   VLDL 26 07/19/2015   LDLCALC 59 09/23/2019   LDLCALC 106 (H) 02/12/2019   Lab Results  Component Value Date   TSH 0.631 12/11/2019   TSH 0.94 02/17/2019    Therapeutic Level Labs: Lab Results  Component Value Date   LITHIUM <0.06 (L) 10/15/2016   Lab Results  Component Value Date   VALPROATE <10.0 (L) 08/23/2007   No components found for:  CBMZ  Current Medications: Current Outpatient Medications  Medication Sig Dispense Refill  . Accu-Chek Softclix Lancets lancets TEST BLOOD SUGAR THREE TIMES DAILY AS DIRECTED 300 each 2  . albuterol (VENTOLIN HFA) 108 (90 Base) MCG/ACT inhaler INHALE 1 TO 2 PUFFS EVERY 6 HOURS AS NEEDED FOR WHEEZING, SHORTNESS OF BREATH 54 each 1  . Alcohol Swabs (B-D SINGLE USE SWABS REGULAR) PADS USE TO CLEAN FINGER PRIOR TO STICKING FOR BLOOD SUGAR. 100 each 11  . alendronate (FOSAMAX) 70 MG tablet TAKE 1 TABLET (70 MG TOTAL) BY MOUTH EVERY 7 (SEVEN) DAYS. TAKE WITH A FULL GLASS OF WATER ON AN EMPTY STOMACH. 12 tablet 3  . amLODipine (NORVASC) 10 MG tablet TAKE 1 TABLET EVERY DAY 90 tablet 3  . ascorbic acid (VITAMIN C) 500 MG tablet Take 500 mg by mouth daily.    [name] aspirin EC 81 MG tablet Take 1 tablet (81 mg total) by mouth daily with breakfast. 120 tablet 2  . betamethasone dipropionate 0.05 % cream Apply topically 2 (two) times daily as needed (Rash). 45 g 3  . Blood Glucose Calibration (ACCU-CHEK GUIDE CONTROL) LIQD USE PRN TO CALIBRATE GLUCOMETER 3 each 11  . blood glucose meter kit and supplies Dispense based on patient and insurance preference. Use up to four times daily as directed. (FOR ICD-10 E10.9, E11.9). 1 each 0  . buPROPion (WELLBUTRIN XL) 150 MG 24 hr tablet Take 1 tablet (150 mg total) by mouth every morning. 90 tablet 2  . cetirizine (ZYRTEC) 10 MG tablet Take 1  tablet (10 mg total) by mouth daily. 7 tablet 0  . clobetasol cream (TEMOVATE) 1.09 % Apply 1 application topically 2 (two) times daily.     . Continuous Blood Gluc Sensor (FREESTYLE LIBRE 14 DAY SENSOR) MISC 1 each by Does not apply route every 14 (fourteen) days. Change every 2 weeks 2 each 11  . cromolyn (NASALCROM) 5.2 MG/ACT nasal spray Place 1 spray into both nostrils 4 (four) times daily. 13 mL 1  . diclofenac Sodium (VOLTAREN) 1 % GEL APPLY 2 GRAMS  TOPICALLY 4 (FOUR) TIMES DAILY. 800 g 0  . dicyclomine (BENTYL) 10 MG capsule Take 1 capsule (10 mg total) by mouth 3 (three) times daily as needed for spasms. 90 capsule 2  . diphenhydrAMINE (BENADRYL) 50 MG tablet Take 1 tablet (50 mg total) by mouth 2 (two) times daily as needed for itching. 30 tablet 0  . ezetimibe (ZETIA) 10 MG tablet Take 1 tablet (10 mg total) by mouth daily. 90 tablet 1  . famotidine (PEPCID) 20 MG tablet Take 1 tablet (20 mg total) by mouth 2 (two) times daily. 14 tablet 0  .  glucose blood (ACCU-CHEK GUIDE) test strip USE TO CHECK BLOOD SUGAR FOUR TIMES A DAY AND PRN 400 each 11  . insulin aspart (NOVOLOG FLEXPEN) 100 UNIT/ML FlexPen Inject 16 to 18 units under skin before meals up to 3 times a day 45 mL 3  . Insulin Pen Needle (SURE COMFORT PEN NEEDLES) 31G X 8 MM MISC USE TO for injecting insulin 4 TIMES DAILY. 200 each 3  . lamoTRIgine (LAMICTAL) 100 MG tablet Take 1 tablet (100 mg total) by mouth 2 (two) times daily. 180 tablet 2  . levothyroxine (SYNTHROID) 50 MCG tablet TAKE 1 TABLET DAILY, EXCEPT TAKE 1/2 TABLET ON SUNDAY 85 tablet 1  . LORazepam (ATIVAN) 0.5 MG tablet Take 1 tablet (0.5 mg total) by mouth 2 (two) times daily. 180 tablet 0  . losartan (COZAAR) 50 MG tablet Take 1 tablet (50 mg total) by mouth daily. 90 tablet 1  . meclizine (ANTIVERT) 25 MG tablet Take 1 tablet (25 mg total) by mouth 3 (three) times daily as needed for dizziness. 30 tablet 0  . metoprolol tartrate (LOPRESSOR) 50 MG tablet TAKE 1  TABLET TWICE DAILY (NEED MD APPOINTMENT) 180 tablet 3  . montelukast (SINGULAIR) 10 MG tablet Take 1 tablet (10 mg total) by mouth daily. 90 tablet 1  . nicotine polacrilex (COMMIT) 4 MG lozenge Take 1 lozenge (4 mg total) by mouth as needed for smoking cessation. (Patient not taking: Reported on 06/14/2020) 100 tablet 0  . nystatin (MYCOSTATIN/NYSTOP) powder APPLY TO AFFECTED AREA 4 TIMES DAILY. 90 g 1  . Omega-3 Fatty Acids (FISH OIL PO) Take 1 capsule by mouth daily.    . oxyCODONE (OXY IR/ROXICODONE) 5 MG immediate release tablet Take 5 mg by mouth every 6 (six) hours as needed.    . Plecanatide (TRULANCE) 3 MG TABS Take 3 mg by mouth daily. 30 tablet 3  . pregabalin (LYRICA) 75 MG capsule Take 1 capsule (75 mg total) by mouth daily.    . RABEprazole (ACIPHEX) 20 MG tablet TAKE 1 TABLET TWICE DAILY (MUST KEEP 07/2020 APPT FOR FURTHER REFILLS) 180 tablet 0  . risperiDONE (RISPERDAL) 0.5 MG tablet Take 1 tablet (0.5 mg total) by mouth at bedtime. 90 tablet 2  . rosuvastatin (CRESTOR) 5 MG tablet TAKE 1 TABLET AT BEDTIME 90 tablet 2  . sertraline (ZOLOFT) 100 MG tablet Take 1 tablet (100 mg total) by mouth daily. 90 tablet 2  . STIOLTO RESPIMAT 2.5-2.5 MCG/ACT AERS INHALE 2 PUFFS INTO THE LUNGS DAILY. 12 g 1  . TOUJEO MAX SOLOSTAR 300 UNIT/ML Solostar Pen INJECT 24 UNITS SUBCUTANEOUSLY INTO THE SKIN AT BEDTIME 12 mL 3  . traZODone (DESYREL) 100 MG tablet Take 1 tablet (100 mg total) by mouth at bedtime. 90 tablet 2  . triamcinolone (KENALOG) 0.1 % Apply 1 application topically daily. 15 g 1  . UNABLE TO FIND Hospital bed mattress x 1  DX: G47.33, J44.9 1 each 0  . UNABLE TO FIND Standard wheelchair Dx M47.16, R29.898 1 each 0   No current facility-administered medications for this visit.     Musculoskeletal: Strength & Muscle Tone: decreased Gait & Station: unsteady Patient leans: N/A  Psychiatric Specialty Exam: Review of Systems  Musculoskeletal: Positive for arthralgias, back pain,  gait problem, joint swelling and neck pain.  All other systems reviewed and are negative.   There were no vitals taken for this visit.There is no height or weight on file to calculate BMI.  General Appearance: NA  Eye Contact:    NA  Speech:  Clear and Coherent  Volume:  Normal  Mood: Irritable  Affect:  NA  Thought Process:  Goal Directed  Orientation:  Full (Time, Place, and Person)  Thought Content: Rumination   Suicidal Thoughts:  No  Homicidal Thoughts:  No  Memory:  Immediate;   Good Recent;   Good Remote;   Fair  Judgement:  Good  Insight:  Fair  Psychomotor Activity:  Decreased  Concentration:  Concentration: Fair and Attention Span: Fair  Recall:  [place] of Knowledge: Good  Language: Good  Akathisia:  No  Handed:  Right  AIMS (if indicated): not done  Assets:  Communication Skills Desire for Improvement Resilience Social Support Talents/Skills  ADL's:  Intact  Cognition: WNL  Sleep:  Fair   Screenings: Mini-Mental   Flowsheet Row Office Visit from 02/03/2019 in [facility] Office Visit from 09/08/2015 in [facility] Visit from 07/02/2014 in [facility] from 12/08/2013 in [facility] [place]  Total Score (max 30 points ) _0 PHQ2-9   Flowsheet Row Video Visit from 07/01/2020 in [facility] from 06/17/2020 in [facility] Video Visit from 05/17/2020 in [facility] Office Visit from 04/28/2020 in [facility] Video Visit from 04/20/2020 in [facility]  PHQ-2 Total Score _1 0  PHQ-9 Total Score -- 16 -- -- --    Flowsheet Row Video Visit from 07/01/2020 in [facility] from 06/17/2020 in [facility] Video Visit from 05/17/2020 in  [facility]  C-SSRS RISK CATEGORY No Risk Low Risk Error: Question 6 not populated       Assessment and Plan: This patient is a 70 year old female with a history of depression and anxiety.  At the present time her mood is stable.  She will continue Wellbutrin XL 150 mg for depression, Zoloft 100 mg daily for depression, trazodone 100 mg at bedtime for sleep, Lamictal 100 mg twice daily for mood stabilization, Ativan 0.5 mg twice daily for anxiety and Risperdal 0.5 mg at bedtime for mood stabilization.  She will return to see me in 3 months   Levonne Spiller, MD 07/01/2020, 4:18 PM

## 2020-07-05 DIAGNOSIS — F250 Schizoaffective disorder, bipolar type: Secondary | ICD-10-CM | POA: Diagnosis not present

## 2020-07-05 DIAGNOSIS — J42 Unspecified chronic bronchitis: Secondary | ICD-10-CM | POA: Diagnosis not present

## 2020-07-05 DIAGNOSIS — F17210 Nicotine dependence, cigarettes, uncomplicated: Secondary | ICD-10-CM | POA: Diagnosis not present

## 2020-07-05 DIAGNOSIS — M160 Bilateral primary osteoarthritis of hip: Secondary | ICD-10-CM | POA: Diagnosis not present

## 2020-07-05 DIAGNOSIS — J449 Chronic obstructive pulmonary disease, unspecified: Secondary | ICD-10-CM | POA: Diagnosis not present

## 2020-07-05 DIAGNOSIS — R2681 Unsteadiness on feet: Secondary | ICD-10-CM | POA: Diagnosis not present

## 2020-07-05 DIAGNOSIS — E1142 Type 2 diabetes mellitus with diabetic polyneuropathy: Secondary | ICD-10-CM | POA: Diagnosis not present

## 2020-07-05 DIAGNOSIS — E039 Hypothyroidism, unspecified: Secondary | ICD-10-CM | POA: Diagnosis not present

## 2020-07-05 DIAGNOSIS — D649 Anemia, unspecified: Secondary | ICD-10-CM | POA: Diagnosis not present

## 2020-07-06 ENCOUNTER — Encounter: Payer: Self-pay | Admitting: Family Medicine

## 2020-07-06 ENCOUNTER — Ambulatory Visit (INDEPENDENT_AMBULATORY_CARE_PROVIDER_SITE_OTHER): Payer: Medicare HMO | Admitting: Family Medicine

## 2020-07-06 ENCOUNTER — Other Ambulatory Visit: Payer: Self-pay

## 2020-07-06 VITALS — BP 137/67 | HR 77 | Resp 15 | Ht 61.0 in | Wt 171.0 lb

## 2020-07-06 DIAGNOSIS — I10 Essential (primary) hypertension: Secondary | ICD-10-CM | POA: Diagnosis not present

## 2020-07-06 DIAGNOSIS — E669 Obesity, unspecified: Secondary | ICD-10-CM | POA: Diagnosis not present

## 2020-07-06 DIAGNOSIS — E039 Hypothyroidism, unspecified: Secondary | ICD-10-CM

## 2020-07-06 DIAGNOSIS — E1143 Type 2 diabetes mellitus with diabetic autonomic (poly)neuropathy: Secondary | ICD-10-CM

## 2020-07-06 DIAGNOSIS — D649 Anemia, unspecified: Secondary | ICD-10-CM

## 2020-07-06 DIAGNOSIS — Z1231 Encounter for screening mammogram for malignant neoplasm of breast: Secondary | ICD-10-CM | POA: Diagnosis not present

## 2020-07-06 DIAGNOSIS — G4733 Obstructive sleep apnea (adult) (pediatric): Secondary | ICD-10-CM

## 2020-07-06 DIAGNOSIS — F3170 Bipolar disorder, currently in remission, most recent episode unspecified: Secondary | ICD-10-CM | POA: Diagnosis not present

## 2020-07-06 DIAGNOSIS — E785 Hyperlipidemia, unspecified: Secondary | ICD-10-CM

## 2020-07-06 DIAGNOSIS — E559 Vitamin D deficiency, unspecified: Secondary | ICD-10-CM

## 2020-07-06 DIAGNOSIS — G894 Chronic pain syndrome: Secondary | ICD-10-CM

## 2020-07-06 DIAGNOSIS — E66811 Obesity, class 1: Secondary | ICD-10-CM

## 2020-07-06 DIAGNOSIS — Z1321 Encounter for screening for nutritional disorder: Secondary | ICD-10-CM

## 2020-07-06 NOTE — Progress Notes (Signed)
[place] Fer[place] Scarola     MRN: [id number]      DOB: 09/26/1950   HPI [name] is here for follow up and re-evaluation of chronic medical conditions, medication management and review of any available recent lab and radiology data.  Preventive health is updated, specifically  Cancer screening and Immunization.   Questions or concerns regarding consultations or procedures which the PT has had in the interim are  addressed. The PT denies any adverse reactions to current medications since the last visit.  C/o poor balance and is seeing both neurology and spine surgery Denies polyuria, polydipsia, blurred vision , or hypoglycemic episodes.   ROS Denies recent fever or chills. Denies sinus pressure, nasal congestion, ear pain or sore throat. Denies chest congestion, productive cough or wheezing. Denies chest pains, palpitations and leg swelling Denies abdominal pain, nausea, vomiting,diarrhea or constipation.   Denies dysuria, frequency, hesitancy or incontinence. Chronic  joint pain, swelling and limitation in mobility. Denies headaches, seizures, numbness, or tingling. Denies uncontrolled  depression, anxiety or insomnia. Denies skin break down or rash.   PE  BP 137/67   Pulse 77   Resp 15   Ht 5\' 1"  (1.549 m)   Wt 171 lb (77.6 kg)   SpO2 93%   BMI 32.31 kg/m  \  Patient alert and oriented and in no cardiopulmonary distress.  HEENT: No facial asymmetry, EOMI,     Neck supple .  Chest: Clear to auscultation bilaterally.  CVS: S1, S2 no murmurs, no S3.Regular rate.  ABD: Soft non tender.   Ext: No edema  OY:DXAJOINOM  ROM spine, shoulders, hips and knees.  Skin: Intact, no ulcerations or rash noted.  Psych: Good eye contact, normal affect. Memory intact not anxious or depressed appearing.  CNS: CN 2-12 intact, power,  normal throughout.no focal deficits noted.   Assessment & Plan  Essential hypertension Controlled, no change in medication DASH diet and commitment to  daily physical activity for a minimum of 30 minutes discussed and encouraged, as a part of hypertension management. The importance of attaining a healthy weight is also discussed.  BP/Weight 07/06/2020 06/14/2020 05/31/2020 05/11/2020 04/28/2020 7/67/2094 7/0/9628  Systolic BP 366 294 765 465 035 465 -  Diastolic BP 67 58 62 54 70 59 -  Wt. (Lbs) 171 170.6 169.6 172.2 171 170 170  BMI 32.31 32.23 32.05 34.78 34.54 34.34 34.34  Some encounter information is confidential and restricted. Go to Review Flowsheets activity to see all data.       Obstructive sleep apnea stilll awaiting CPAP  Chronic pain syndrome Managed through pain clinic  Hyperlipidemia Hyperlipidemia:Low fat diet discussed and encouraged.   Lipid Panel  Lab Results  Component Value Date   CHOL 125 09/23/2019   HDL 45 09/23/2019   LDLCALC 59 09/23/2019   TRIG 117 09/23/2019   CHOLHDL 3.5 09/30/2018   Updated lab needed at/ before next visit.     Obesity (BMI 30.0-34.9)  Patient re-educated about  the importance of commitment to a  minimum of 150 minutes of exercise per week as able.  The importance of healthy food choices with portion control discussed, as well as eating regularly and within a 12 hour window most days. The need to choose "clean , green" food 50 to 75% of the time is discussed, as well as to make water the primary drink and set a goal of 64 ounces water daily.    Weight /BMI 07/07/2020 07/06/2020 06/14/2020  WEIGHT 170 lb 171  lb 170 lb 9.6 oz  HEIGHT 5\' 1"  5\' 1"  5\' 1"   BMI 32.12 kg/m2 32.31 kg/m2 32.23 kg/m2  Some encounter information is confidential and restricted. Go to Review Flowsheets activity to see all data.      Well controlled type 2 diabetes mellitus with gastroparesis ([name]) Managed by Endo [name] is reminded of the importance of commitment to daily physical activity for 30 minutes or more, as able and the need to limit carbohydrate intake to 30 to 60 grams per meal to help with  blood sugar control.   The need to take medication as prescribed, test blood sugar as directed, and to call between visits if there is a concern that blood sugar is uncontrolled is also discussed.   [name] is reminded of the importance of daily foot exam, annual eye examination, and good blood sugar, blood pressure and cholesterol control.  Diabetic Labs Latest Ref Rng & Units 05/04/2020 03/25/2020 02/20/2020 01/28/2020 12/11/2019  HbA1c 4.0 - 5.6 % - 5.3 - - -  Microalbumin Not Estab. ug/mL - - - - -  Micro/Creat Ratio 0 - 29 mg/g creat - - - - -  Chol 100 - 199 mg/dL - - - - -  HDL >39 mg/dL - - - - -  Calc LDL 0 - 99 mg/dL - - - - -  Triglycerides 0 - 149 mg/dL - - - - -  Creatinine 0.44 - 1.00 mg/dL 1.13(H) - 1.50(H) 1.09(H) 0.94  GFR >60.00 mL/min - - - - -   BP/Weight 07/07/2020 07/06/2020 06/14/2020 05/31/2020 05/11/2020 04/28/2020 1/51/7616  Systolic BP 073 710 626 948 546 270 350  Diastolic BP 67 67 58 62 54 70 59  Wt. (Lbs) 170 171 170.6 169.6 172.2 171 170  BMI 32.12 32.31 32.23 32.05 34.78 34.54 34.34  Some encounter information is confidential and restricted. Go to Review Flowsheets activity to see all data.   Foot/eye exam completion dates Latest Ref Rng & Units 05/26/2020 01/01/2019  Eye Exam No Retinopathy No Retinopathy -  Foot exam Order - - -  Foot Form Completion - - Done

## 2020-07-06 NOTE — Assessment & Plan Note (Signed)
Controlled, no change in medication DASH diet and commitment to daily physical activity for a minimum of 30 minutes discussed and encouraged, as a part of hypertension management. The importance of attaining a healthy weight is also discussed.  BP/Weight 07/06/2020 06/14/2020 05/31/2020 05/11/2020 04/28/2020 7/48/2707 10/17/7542  Systolic BP 920 100 712 197 588 325 -  Diastolic BP 67 58 62 54 70 59 -  Wt. (Lbs) 171 170.6 169.6 172.2 171 170 170  BMI 32.31 32.23 32.05 34.78 34.54 34.34 34.34  Some encounter information is confidential and restricted. Go to Review Flowsheets activity to see all data.

## 2020-07-06 NOTE — Patient Instructions (Addendum)
Annual physical exam in November when due with PCP, call if you need me sooner   CONGRATS on smoking cessation, continue to stay away from cigarettes  Covid booster due in June  Please schedule mammogram at checkout  [facility] in June 11 or after Fasting lipid, cmp and EGFR, vit D in mid October  Cholesterol in February  is excellent  It is important that you exercise regularly at least 30 minutes 5 times a week. If you develop chest pain, have severe difficulty breathing, or feel very tired, stop exercising immediately and seek medical attention   Think about what you will eat, plan ahead. Choose " clean, green, fresh or frozen" over canned, processed or packaged foods which are more sugary, salty and fatty. 70 to 75% of food eaten should be vegetables and fruit. Three meals at set times with snacks allowed between meals, but they must be fruit or vegetables. Aim to eat over a 12 hour period , example 7 am to 7 pm, and STOP after  your last meal of the day. Drink water,generally about 64 ounces per day, no other drink is as healthy. Fruit juice is best enjoyed in a healthy way, by EATING the fruit. Thanks for choosing [facility], we consider it a privelige to serve you.

## 2020-07-06 NOTE — Assessment & Plan Note (Signed)
stilll awaiting CPAP

## 2020-07-07 ENCOUNTER — Ambulatory Visit (INDEPENDENT_AMBULATORY_CARE_PROVIDER_SITE_OTHER): Payer: Medicare HMO

## 2020-07-07 VITALS — BP 137/67 | Ht 61.0 in | Wt 170.0 lb

## 2020-07-07 DIAGNOSIS — Z0000 Encounter for general adult medical examination without abnormal findings: Secondary | ICD-10-CM | POA: Diagnosis not present

## 2020-07-07 NOTE — Patient Instructions (Signed)
[name] , Thank you for taking time to come for your Medicare Wellness Visit. I appreciate your ongoing commitment to your health goals. Please review the following plan we discussed and let me know if I can assist you in the future.   Screening recommendations/referrals: Colonoscopy: due 01/2021 Mammogram: due Sept 2022 Bone Density: Due Dec 2022 Recommended yearly ophthalmology/optometry visit for glaucoma screening and checkup Recommended yearly dental visit for hygiene and checkup  Vaccinations: Influenza vaccine: Due in August 2022 Pneumococcal vaccine: up to date  Tdap vaccine: Due in 2023 Shingles vaccine: up to date      Conditions/risks identified: fall risk   Next appointment: wellness in 1 year    Preventive Care 63 Years and Older, Female Preventive care refers to lifestyle choices and visits with your health care provider that can promote health and wellness. What does preventive care include?  A yearly physical exam. This is also called an annual well check.  Dental exams once or twice a year.  Routine eye exams. Ask your health care provider how often you should have your eyes checked.  Personal lifestyle choices, including:  Daily care of your teeth and gums.  Regular physical activity.  Eating a healthy diet.  Avoiding tobacco and drug use.  Limiting alcohol use.  Practicing safe sex.  Taking low-dose aspirin every day.  Taking vitamin and mineral supplements as recommended by your health care provider. What happens during an annual well check? The services and screenings done by your health care provider during your annual well check will depend on your age, overall health, lifestyle risk factors, and family history of disease. Counseling  Your health care provider may ask you questions about your:  Alcohol use.  Tobacco use.  Drug use.  Emotional well-being.  Home and relationship well-being.  Sexual activity.  Eating  habits.  History of falls.  Memory and ability to understand (cognition).  Work and work Statistician.  Reproductive health. Screening  You may have the following tests or measurements:  Height, weight, and BMI.  Blood pressure.  Lipid and cholesterol levels. These may be checked every 5 years, or more frequently if you are over 5 years old.  Skin check.  Lung cancer screening. You may have this screening every year starting at age 29 if you have a 30-pack-year history of smoking and currently smoke or have quit within the past 15 years.  Fecal occult blood test (FOBT) of the stool. You may have this test every year starting at age 71.  Flexible sigmoidoscopy or colonoscopy. You may have a sigmoidoscopy every 5 years or a colonoscopy every 10 years starting at age 51.  Hepatitis C blood test.  Hepatitis B blood test.  Sexually transmitted disease (STD) testing.  Diabetes screening. This is done by checking your blood sugar (glucose) after you have not eaten for a while (fasting). You may have this done every 1-3 years.  Bone density scan. This is done to screen for osteoporosis. You may have this done starting at age 37.  Mammogram. This may be done every 1-2 years. Talk to your health care provider about how often you should have regular mammograms. Talk with your health care provider about your test results, treatment options, and if necessary, the need for more tests. Vaccines  Your health care provider may recommend certain vaccines, such as:  Influenza vaccine. This is recommended every year.  Tetanus, diphtheria, and acellular pertussis (Tdap, Td) vaccine. You may need a Td booster every  10 years.  Zoster vaccine. You may need this after age 11.  Pneumococcal 13-valent conjugate (PCV13) vaccine. One dose is recommended after age 106.  Pneumococcal polysaccharide (PPSV23) vaccine. One dose is recommended after age 69. Talk to your health care provider about which  screenings and vaccines you need and how often you need them. This information is not intended to replace advice given to you by your health care provider. Make sure you discuss any questions you have with your health care provider. Document Released: 03/26/2015 Document Revised: 11/17/2015 Document Reviewed: 12/29/2014 Elsevier Interactive Patient Education  2017 [place] Prevention in the Home Falls can cause injuries. They can happen to people of all ages. There are many things you can do to make your home safe and to help prevent falls. What can I do on the outside of my home?  Regularly fix the edges of walkways and driveways and fix any cracks.  Remove anything that might make you trip as you walk through a door, such as a raised step or threshold.  Trim any bushes or trees on the path to your home.  Use bright outdoor lighting.  Clear any walking paths of anything that might make someone trip, such as rocks or tools.  Regularly check to see if handrails are loose or broken. Make sure that both sides of any steps have handrails.  Any raised decks and porches should have guardrails on the edges.  Have any leaves, snow, or ice cleared regularly.  Use sand or salt on walking paths during winter.  Clean up any spills in your garage right away. This includes oil or grease spills. What can I do in the bathroom?  Use night lights.  Install grab bars by the toilet and in the tub and shower. Do not use towel bars as grab bars.  Use non-skid mats or decals in the tub or shower.  If you need to sit down in the shower, use a plastic, non-slip stool.  Keep the floor dry. Clean up any water that spills on the floor as soon as it happens.  Remove soap buildup in the tub or shower regularly.  Attach bath mats securely with double-sided non-slip rug tape.  Do not have throw rugs and other things on the floor that can make you trip. What can I do in the bedroom?  Use  night lights.  Make sure that you have a light by your bed that is easy to reach.  Do not use any sheets or blankets that are too big for your bed. They should not hang down onto the floor.  Have a firm chair that has side arms. You can use this for support while you get dressed.  Do not have throw rugs and other things on the floor that can make you trip. What can I do in the kitchen?  Clean up any spills right away.  Avoid walking on wet floors.  Keep items that you use a lot in easy-to-reach places.  If you need to reach something above you, use a strong step stool that has a grab bar.  Keep electrical cords out of the way.  Do not use floor polish or wax that makes floors slippery. If you must use wax, use non-skid floor wax.  Do not have throw rugs and other things on the floor that can make you trip. What can I do with my stairs?  Do not leave any items on the stairs.  Make sure  that there are handrails on both sides of the stairs and use them. Fix handrails that are broken or loose. Make sure that handrails are as long as the stairways.  Check any carpeting to make sure that it is firmly attached to the stairs. Fix any carpet that is loose or worn.  Avoid having throw rugs at the top or bottom of the stairs. If you do have throw rugs, attach them to the floor with carpet tape.  Make sure that you have a light switch at the top of the stairs and the bottom of the stairs. If you do not have them, ask someone to add them for you. What else can I do to help prevent falls?  Wear shoes that:  Do not have high heels.  Have rubber bottoms.  Are comfortable and fit you well.  Are closed at the toe. Do not wear sandals.  If you use a stepladder:  Make sure that it is fully opened. Do not climb a closed stepladder.  Make sure that both sides of the stepladder are locked into place.  Ask someone to hold it for you, if possible.  Clearly mark and make sure that you  can see:  Any grab bars or handrails.  First and last steps.  Where the edge of each step is.  Use tools that help you move around (mobility aids) if they are needed. These include:  Canes.  Walkers.  Scooters.  Crutches.  Turn on the lights when you go into a dark area. Replace any light bulbs as soon as they burn out.  Set up your furniture so you have a clear path. Avoid moving your furniture around.  If any of your floors are uneven, fix them.  If there are any pets around you, be aware of where they are.  Review your medicines with your doctor. Some medicines can make you feel dizzy. This can increase your chance of falling. Ask your doctor what other things that you can do to help prevent falls. This information is not intended to replace advice given to you by your health care provider. Make sure you discuss any questions you have with your health care provider. Document Released: 12/24/2008 Document Revised: 08/05/2015 Document Reviewed: 04/03/2014 Elsevier Interactive Patient Education  2017 [employer].

## 2020-07-07 NOTE — Progress Notes (Signed)
Subjective:   [name] is a 70 y.o. female who presents for Medicare Annual (Subsequent) preventive examination.  Review of Systems     Cardiac Risk Factors include: diabetes mellitus;dyslipidemia;hypertension;sedentary lifestyle     Objective:    Today's Vitals   07/07/20 1323  BP: 137/67  Weight: 170 lb (77.1 kg)  Height: 5' 1"  (1.549 m)   Body mass index is 32.12 kg/m.  Advanced Directives 05/11/2020 04/27/2020 02/20/2020 02/19/2020 02/12/2020 11/23/2019 07/07/2019  Does Patient Have a Medical Advance Directive? Yes No No No No No -  Type of Advance Directive [place] will Living will - - -  Does patient want to make changes to medical advance directive? No - Patient declined - - - No - Patient declined - -  Copy of [place] in Chart? No - copy requested - - - - - -  Would patient like information on creating a medical advance directive? No - Patient declined - No - Patient declined No - Patient declined No - Patient declined No - Patient declined Yes (ED - Information included in AVS)  Pre-existing out of facility DNR order (yellow form or pink MOST form) - - - - - - -    Current Medications (verified) Outpatient Encounter Medications as of 07/07/2020  Medication Sig  . Accu-Chek Softclix Lancets lancets TEST BLOOD SUGAR THREE TIMES DAILY AS DIRECTED  . albuterol (VENTOLIN HFA) 108 (90 Base) MCG/ACT inhaler INHALE 1 TO 2 PUFFS EVERY 6 HOURS AS NEEDED FOR WHEEZING, SHORTNESS OF BREATH  . Alcohol Swabs (B-D SINGLE USE SWABS REGULAR) PADS USE TO CLEAN FINGER PRIOR TO STICKING FOR BLOOD SUGAR.  [name] alendronate (FOSAMAX) 70 MG tablet TAKE 1 TABLET (70 MG TOTAL) BY MOUTH EVERY 7 (SEVEN) DAYS. TAKE WITH A FULL GLASS OF WATER ON AN EMPTY STOMACH.  [name] amLODipine (NORVASC) 10 MG tablet TAKE 1 TABLET EVERY DAY  . ascorbic acid (VITAMIN C) 500 MG tablet Take 500 mg by mouth daily.  [name] aspirin EC 81 MG tablet Take 1 tablet (81 mg total) by  mouth daily with breakfast.  . betamethasone dipropionate 0.05 % cream Apply topically 2 (two) times daily as needed (Rash).  . Blood Glucose Calibration (ACCU-CHEK GUIDE CONTROL) LIQD USE PRN TO CALIBRATE GLUCOMETER  . blood glucose meter kit and supplies Dispense based on patient and insurance preference. Use up to four times daily as directed. (FOR ICD-10 E10.9, E11.9).  [name] buPROPion (WELLBUTRIN XL) 150 MG 24 hr tablet Take 1 tablet (150 mg total) by mouth every morning.  . cetirizine (ZYRTEC) 10 MG tablet Take 1 tablet (10 mg total) by mouth daily.  . clobetasol cream (TEMOVATE) 9.92 % Apply 1 application topically 2 (two) times daily.   . Continuous Blood Gluc Sensor (FREESTYLE LIBRE 14 DAY SENSOR) MISC 1 each by Does not apply route every 14 (fourteen) days. Change every 2 weeks  . cromolyn (NASALCROM) 5.2 MG/ACT nasal spray Place 1 spray into both nostrils 4 (four) times daily.  . diclofenac Sodium (VOLTAREN) 1 % GEL APPLY 2 GRAMS  TOPICALLY 4 (FOUR) TIMES DAILY.  [name] dicyclomine (BENTYL) 10 MG capsule Take 1 capsule (10 mg total) by mouth 3 (three) times daily as needed for spasms.  . diphenhydrAMINE (BENADRYL) 50 MG tablet Take 1 tablet (50 mg total) by mouth 2 (two) times daily as needed for itching.  . ezetimibe (ZETIA) 10 MG tablet Take 1 tablet (10 mg total) by mouth daily.  [name]  famotidine (PEPCID) 20 MG tablet Take 1 tablet (20 mg total) by mouth 2 (two) times daily.  [name] glucose blood (ACCU-CHEK GUIDE) test strip USE TO CHECK BLOOD SUGAR FOUR TIMES A DAY AND PRN  . insulin aspart (NOVOLOG FLEXPEN) 100 UNIT/ML FlexPen Inject 16 to 18 units under skin before meals up to 3 times a day  . Insulin Pen Needle (SURE COMFORT PEN NEEDLES) 31G X 8 MM MISC USE TO for injecting insulin 4 TIMES DAILY.  [name] lamoTRIgine (LAMICTAL) 100 MG tablet Take 1 tablet (100 mg total) by mouth 2 (two) times daily.  [name] levothyroxine (SYNTHROID) 50 MCG tablet TAKE 1 TABLET DAILY, EXCEPT TAKE 1/2 TABLET ON SUNDAY  .  LORazepam (ATIVAN) 0.5 MG tablet Take 1 tablet (0.5 mg total) by mouth 2 (two) times daily.  [name] losartan (COZAAR) 50 MG tablet Take 1 tablet (50 mg total) by mouth daily.  . meclizine (ANTIVERT) 25 MG tablet Take 1 tablet (25 mg total) by mouth 3 (three) times daily as needed for dizziness.  . metoprolol tartrate (LOPRESSOR) 50 MG tablet TAKE 1 TABLET TWICE DAILY (NEED MD APPOINTMENT)  . montelukast (SINGULAIR) 10 MG tablet Take 1 tablet (10 mg total) by mouth daily.  . nicotine polacrilex (COMMIT) 4 MG lozenge Take 1 lozenge (4 mg total) by mouth as needed for smoking cessation.  [name] nystatin (MYCOSTATIN/NYSTOP) powder APPLY TO AFFECTED AREA 4 TIMES DAILY.  [name] Omega-3 Fatty Acids (FISH OIL PO) Take 1 capsule by mouth daily.  [name] oxyCODONE (OXY IR/ROXICODONE) 5 MG immediate release tablet Take 5 mg by mouth every 6 (six) hours as needed.  [name] Plecanatide (TRULANCE) 3 MG TABS Take 3 mg by mouth daily.  . pregabalin (LYRICA) 75 MG capsule Take 1 capsule (75 mg total) by mouth daily.  . RABEprazole (ACIPHEX) 20 MG tablet TAKE 1 TABLET TWICE DAILY (MUST KEEP 07/2020 APPT FOR FURTHER REFILLS)  . risperiDONE (RISPERDAL) 0.5 MG tablet Take 1 tablet (0.5 mg total) by mouth at bedtime.  . rosuvastatin (CRESTOR) 5 MG tablet TAKE 1 TABLET AT BEDTIME  . sertraline (ZOLOFT) 100 MG tablet Take 1 tablet (100 mg total) by mouth daily.  [name] STIOLTO RESPIMAT 2.5-2.5 MCG/ACT AERS INHALE 2 PUFFS INTO THE LUNGS DAILY.  [name] TOUJEO MAX SOLOSTAR 300 UNIT/ML Solostar Pen INJECT 24 UNITS SUBCUTANEOUSLY INTO THE SKIN AT BEDTIME  . traZODone (DESYREL) 100 MG tablet Take 1 tablet (100 mg total) by mouth at bedtime.  . triamcinolone (KENALOG) 0.1 % Apply 1 application topically daily.  [name] UNABLE TO [facility] bed mattress x 1  DX: G47.33, J44.9  . UNABLE TO FIND Standard wheelchair Dx M47.16, [id number]   No facility-administered encounter medications on file as of 07/07/2020.    Allergies (verified) [place] bovis, Phenazopyridine hcl,  Cephalexin, Flonase [fluticasone], Iron, Milk-related compounds, Penicillins, and Phenazopyridine hcl   History: Past Medical History:  Diagnosis Date  . Allergy   . Anemia   . Anxiety    takes Ativan daily  . Arthritis   . Assistance needed for mobility   . Bipolar disorder ([place])    takes Risperdal nightly  . Blood transfusion   . Brain tumor ([place])   . Cancer ([place])    In her gum  . Carpal tunnel syndrome of right wrist 05/23/2011  . Cervical disc disorder with radiculopathy of cervical region 10/31/2012  . Chronic back pain   . Chronic idiopathic constipation   . Chronic neck and back pain   . Colon polyps   .  COPD (chronic obstructive pulmonary disease) with chronic bronchitis ([place]) 09/16/2013   Office Spirometry 10/30/2013-submaximal effort based on appearance of loop and curve. Numbers would fit with severe restriction but her physiologic capability may be better than this. FVC 0.91/44%, and 10.74/45%, FEV1/FVC 0.81, FEF 25-75% 1.43/69%    . Diabetes mellitus    Type II  . Diverticulosis    TCS 9/08 by Dr. Delfin Edis for diarrhea . Bx for micro scopic colitis negative.   . Fibromyalgia   . Frequent falls   . GERD (gastroesophageal reflux disease)    takes Aciphex daily  . Glaucoma    eye drops daily  . Gum symptoms    infection on antibiotic  . Hemiplegia affecting non-dominant side, post-stroke 08/02/2011  . Hiatal hernia   . Hyperlipidemia    takes Crestor daily  . Hypertension    takes Amlodipine,Metoprolol,and Clonidine daily  . Hypothyroidism    takes Synthroid daily  . IBS (irritable bowel syndrome)   . Insomnia    takes Trazodone nightly  . Major depression, recurrent ([place])    takes Zoloft daily  . 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 Metabolic encephalopathy 11/08/9369  . Migraines    chronic headaches  . Mononeuritis lower limb   . Narcolepsy   . Osteoporosis   . Pancreatitis 2006   due to Depakote with normal EUS   . Schatzki's ring    non critical  / EGD with ED 8/2011with RMR  . Seizures ([place])    takes Lamictal daily.Last seizure 3 yrs ago  . Sleep apnea    on CPAP  . Small bowel obstruction ([place])   . Stroke [facility])    left sided weakness, speech changes  . Tubular adenoma of colon    Past Surgical History:  Procedure Laterality Date  . ABDOMINAL HYSTERECTOMY  1978  . BACK SURGERY  July 2012  . BACTERIAL OVERGROWTH TEST N/A 05/05/2013   Procedure: BACTERIAL OVERGROWTH TEST;  Surgeon: Daneil Dolin, MD;  [facility];  Service: Endoscopy;  Laterality: N/A;  7:30  . BIOPSY THYROID  2009  . BRAIN SURGERY  11/2011   resection of meningioma  . BREAST REDUCTION SURGERY  1994  . CARDIAC CATHETERIZATION  05/10/2005   normal coronaries, normal LV systolic function and EF (Dr. Jackie Plum)  . CARPAL TUNNEL RELEASE Left 07/22/04   Dr. Aline Brochure  . CATARACT EXTRACTION Bilateral   . CHOLECYSTECTOMY  1984  . COLONOSCOPY N/A 09/25/2012   IRC:VELFYBO diverticulosis.  colonic polyp-removed : tubular adenoma  . CRANIOTOMY  11/23/2011   Procedure: CRANIOTOMY TUMOR EXCISION;  Surgeon: Hosie Spangle, MD;  Location: [place] [facility]URO ORS;  Service: Neurosurgery;  Laterality: N/A;  Craniotomy for tumor resection  . ESOPHAGOGASTRODUODENOSCOPY  12/29/2010   [place]k-Retained food in the esophagus and stomach, small hiatal hernia, status post Maloney dilation of the esophagus  . ESOPHAGOGASTRODUODENOSCOPY N/A 09/25/2012   FBP:ZWCHENID atonic baggy esophagus status post Maloney dilation 88 F. Hiatal hernia  . GIVENS CAPSULE STUDY N/A 01/15/2013   NORMAL.   . IR GENERIC HISTORICAL  03/17/2016   IR RADIOLOGIST EVAL & MGMT 03/17/2016 [facility]-INTERV RAD  . LESION REMOVAL N/A 05/31/2015   Procedure: REMOVAL RIGHT AND LEFT LESIONS OF MANDIBLE;  Surgeon: Diona Browner, DDS;  Location: [facility];  Service: Oral Surgery;  Laterality: N/A;  . MALONEY DILATION  12/29/2010   RMR;  . NM MYOCAR PERF WALL MOTION  2006   "relavtiely normal" persantine, mild anterior thinning (breast  attenuation artifact), no  region of scar/ischemia  . OVARIAN CYST REMOVAL    . RECTOCELE REPAIR N/A 06/29/2015   Procedure: POSTERIOR REPAIR (RECTOCELE);  Surgeon: Jonnie Kind, MD;  Location: [facility];  Service: Gynecology;  Laterality: N/A;  . REDUCTION MAMMAPLASTY Bilateral   . SPINE SURGERY  09/29/2010   Dr. Rolena Infante  . surgical excision of 3 tumors from right thigh and right buttock  and left upper thigh  2010  . TOOTH EXTRACTION Bilateral 12/14/2014   Procedure: REMOVAL OF BILATERAL MANDIBULAR EXOSTOSES;  Surgeon: Diona Browner, DDS;  Location: [place];  Service: Oral Surgery;  Laterality: Bilateral;  . TRANSTHORACIC ECHOCARDIOGRAM  2010   EF 60-65%, mild conc LVH, grade 1 diastolic dysfunction; mildly calcified MV annulus with mildly thickened leaflets, mildly calcified MR annulus   Family History  Problem Relation Age of Onset  . Heart attack Mother        HTN  . Pneumonia Father   . Kidney failure Father   . Diabetes Father   . Pancreatic cancer Sister   . Diabetes Brother   . Hypertension Brother   . Diabetes Brother   . Cancer Sister        breast   . Hypertension Son   . Sleep apnea Son   . Cancer Sister        pancreatic  . Stroke Maternal Grandmother   . Heart attack Maternal Grandfather   . Alcohol abuse Maternal Uncle   . Colon cancer Neg Hx   . Anesthesia problems Neg Hx   . Hypotension Neg Hx   . Malignant hyperthermia Neg Hx   . Pseudochol deficiency Neg Hx   . Breast cancer Neg Hx    Social History   Socioeconomic History  . Marital status: Divorced    Spouse name: Not on file  . Number of children: 1  . Years of education: 24  . Highest education level: High school graduate  Occupational History  . Occupation: Disabled  Tobacco Use  . Smoking status: Former Smoker    Packs/day: 0.50    Years: 30.00    Pack years: 15.00    Types: Cigarettes  . Smokeless tobacco: Never Used  . Tobacco comment: quit 5 weeks ago   Vaping Use  . Vaping Use: Never  used  Substance and Sexual Activity  . Alcohol use: No    Alcohol/week: 0.0 standard drinks    Comment:    . Drug use: No  . Sexual activity: Not Currently  Other Topics Concern  . Not on file  Social History Narrative   01/29/18 Lives alone, has 3 aides, Mon- Fri 8 hrs, 2 hrs on Sat-Sun, RN manages her meds   Caffeine use: Drink coffee sometimes    Right handed    Social Determinants of Health   Financial Resource Strain: Low Risk   . Difficulty of Paying Living Expenses: Not very hard  Food Insecurity: No Food Insecurity  . Worried About Charity fundraiser in the Last Year: Never true  . Ran Out of Food in the Last Year: Never true  Transportation Needs: No Transportation Needs  . Lack of Transportation (Medical): No  . Lack of Transportation (Non-Medical): No  Physical Activity: Inactive  . Days of Exercise per Week: 0 days  . Minutes of Exercise per Session: 0 min  Stress: Not on file  Social Connections: Moderately Isolated  . Frequency of Communication with Friends and Family: Three times a week  . Frequency of Social Gatherings  with Friends and Family: Twice a week  . Attends Religious Services: More than 4 times per year  . Active Member of Clubs or Organizations: No  . Attends Archivist Meetings: Never  . Marital Status: Divorced    Tobacco Counseling Counseling given: Not Answered Comment: quit 5 weeks ago    Clinical Intake:  Pre-visit preparation completed: No  Pain : No/denies pain     Nutritional Status: BMI 25 -29 Overweight Diabetes: Yes CBG done?: No Did pt. bring in CBG monitor from home?: No  How often do you need to have someone help you when you read instructions, pamphlets, or other written materials from your doctor or pharmacy?: 2 - Rarely  Diabetic? yes  Interpreter Needed?: No      Activities of Daily Living In your present state of health, do you have any difficulty performing the following activities: 07/07/2020   Hearing? N  Vision? N  Difficulty concentrating or making decisions? N  Walking or climbing stairs? Y  Dressing or bathing? Y  Comment aide has to help her  Doing errands, shopping? Y  Preparing Food and eating ? N  Using the Toilet? N  In the past six months, have you accidently leaked urine? N  Do you have problems with loss of bowel control? N  Managing your Medications? N  Managing your Finances? N  Housekeeping or managing your Housekeeping? N  Some recent data might be hidden    Patient Care Team: [name], MD as PCP - General [place] Lenise Herald, MD (Inactive) as PCP - Cardiology (Cardiology) Gala Romney Cristopher Estimable, MD (Gastroenterology) Deneise Lever, MD as Consulting Physician (Pulmonary Disease) Jovita Gamma, MD as Consulting Physician (Neurosurgery) Suella Broad, MD as Consulting Physician (Physical Medicine and Rehabilitation) Pieter Partridge, DO as Consulting Physician (Neurology) Lavonna Monarch, MD as Consulting Physician (Dermatology) Leta Baptist, MD as Consulting Physician (Otolaryngology) Latanya Maudlin, MD as Consulting Physician (Orthopedic Surgery) Cloria Spring, MD as Consulting Physician ([place]) Melina Schools, MD as Consulting Physician (Orthopedic Surgery) Lendon Colonel, NP as Nurse Practitioner (Nurse Practitioner) Milus Banister, MD as Attending Physician (Gastroenterology) Pyrtle, Lajuan Lines, MD as Consulting Physician (Gastroenterology) Monico Hoar, PT as Physical Therapist (Physical Therapy) Conrad [place], MD as Consulting Physician (Vascular Surgery) Inocencio Homes, DPM as Consulting Physician (Podiatry) Newt Minion, MD as Consulting Physician (Orthopedic Surgery) Jon Billings, RN as [place] Management Lavonna Monarch, MD as Consulting Physician (Dermatology)  Indicate any recent Medical Services you may have received from other than [facility] providers in the past year (date may be  approximate).     Assessment:   This is a routine wellness examination for [place].  Hearing/Vision screen No exam data present  Dietary issues and exercise activities discussed: Current Exercise Habits: The patient does not participate in regular exercise at present  Goals    . Exercise 3x per week (30 min per time)     Recommend starting chair exercises at least 3 days a week for 30-45 minutes at a time as tolerated.      . Prevent falls     Prevent falls     . Quit smoking / using tobacco      Depression Screen PHQ 2/9 Scores 07/07/2020 04/28/2020 04/20/2020 03/09/2020 02/19/2020 02/09/2020 12/11/2019  PHQ - 2 Score 0 1 0 0 0 1 0  PHQ- 9 Score - - - - - - -  Some encounter information is confidential and restricted.  Go to Review Flowsheets activity to see all data.    Fall Risk Fall Risk  07/06/2020 04/28/2020 04/20/2020 03/09/2020 02/19/2020  Falls in the past year? 1 0 1 1 1   Comment - - - - -  Number falls in past yr: 1 0 0 1 1  Comment - - - - -  Injury with Fall? 1 0 1 1 0  Comment - - - - -  Risk Factor Category  - - - - -  Risk for fall due to : - No Fall Risks Impaired balance/gait - Impaired balance/gait  Follow up - Falls evaluation completed Falls evaluation completed - Falls evaluation completed    FALL RISK PREVENTION PERTAINING TO THE HOME:  Any stairs in or around the home? No  If so, are there any without handrails? No  Home free of loose throw rugs in walkways, pet beds, electrical cords, etc? Yes  Adequate lighting in your home to reduce risk of falls? Yes   ASSISTIVE DEVICES UTILIZED TO PREVENT FALLS:  Life alert? Yes  Use of a cane, walker or w/c? Yes  Grab bars in the bathroom? Yes  Shower chair or bench in shower? Yes  Elevated toilet seat or a handicapped toilet? Yes   TIMED UP AND GO:  Was the test performed? Yes .  Length of time to ambulate 10 feet: 16 sec.   Gait slow and steady with assistive device  Cognitive Function: MMSE - Mini  Mental State Exam 02/03/2019 03/20/2016 09/08/2015 07/02/2014 12/08/2013  Not completed: - (No Data) - - -  Orientation to time 5 - 5 5 5   Orientation to Place 5 - 3 5 5   Registration 3 - 2 3 2   Attention/ Calculation 5 - 5 3 5   Recall 1 - 2 3 2   Language- name 2 objects 2 - 2 2 2   Language- repeat 1 - 0 1 1  Language- follow 3 step command 3 - 3 3 3   Language- read & follow direction 1 - 1 1 1   Write a sentence 1 - 1 0 1  Copy design 0 - 0 0 0  Total score 27 - 24 26 27      6CIT Screen 07/07/2020 07/07/2019 06/12/2018 01/31/2018 12/06/2016  What Year? 0 points 0 points 0 points 0 points 0 points  What month? 0 points 0 points 0 points 0 points 0 points  What time? - 0 points 0 points 0 points 0 points  Count back from 20 - 0 points 0 points 0 points 0 points  Months in reverse - 0 points 0 points 0 points 0 points  Repeat phrase - 0 points 4 points 0 points 0 points  Total Score - 0 4 0 0    Immunizations Immunization History  Administered Date(s) Administered  . Fluad Quad(high Dose 65+) 01/01/2019, 12/11/2019  . H1N1 01/09/2008  . Influenza Split 12/12/2010, 11/16/2011  . Influenza Whole 11/23/2006, 12/10/2007, 12/11/2008, 11/16/2009  . Influenza,inj,Quad PF,6+ Mos 11/28/2012, 11/20/2013, 11/10/2014, 12/06/2015, 11/21/2016, 11/20/2017  . Influenza,inj,quad, With Preservative 12/11/2016, 01/07/2018  . Moderna Sars-Covid-2 Vaccination 04/27/2019, 05/25/2019, 02/16/2020  . Pneumococcal Conjugate-13 09/16/2013  . Pneumococcal Polysaccharide-23 08/03/2003, 12/11/2008, 08/22/2010, 08/31/2015  . Td 09/23/2003  . Tdap 12/29/2011  . Zoster Recombinat (Shingrix) 02/12/2019, 07/03/2019    TDAP status: Up to date  Flu Vaccine status: Up to date  Pneumococcal vaccine status: Up to date  Covid-19 vaccine status: Completed vaccines  Qualifies for Shingles Vaccine? Yes   Zostavax completed No  Shingrix Completed?: Yes  Screening Tests Health Maintenance  Topic Date Due  .  OPHTHALMOLOGY EXAM  03/17/2020  . HEMOGLOBIN A1C  09/22/2020  . INFLUENZA VACCINE  10/11/2020  . FOOT EXAM  01/19/2021  . COLONOSCOPY (Pts 45-40yr Insurance coverage will need to be confirmed)  02/09/2021  . MAMMOGRAM  12/09/2021  . TETANUS/TDAP  12/28/2021  . DEXA SCAN  Completed  . COVID-19 Vaccine  Completed  . Hepatitis C Screening  Completed  . PNA vac Low Risk Adult  Completed  . HPV VACCINES  Aged Out    Health Maintenance  Health Maintenance Due  Topic Date Due  . OPHTHALMOLOGY EXAM  03/17/2020    Colorectal cancer screening: Type of screening: Colonoscopy. Completed 2012. Repeat every 10 years  Mammogram status: Completed yes. Repeat every year  Bone Density status: Completed 2020. Results reflect: Bone density results: OSTEOPOROSIS. Repeat every 2 years.  Lung Cancer Screening: (Low Dose CT Chest recommended if Age 70-80years, 30 pack-year currently smoking OR have quit w/in 15years.) yes. Scheduled for 07/14/2020  Lung Cancer Screening Referral: already scheduled   Additional Screening:  Hepatitis C Screening: does qualify; Completed yes  Vision Screening: Recommended annual ophthalmology exams for early detection of glaucoma and other disorders of the eye. Is the patient up to date with their annual eye exam?  Yes  Who is the provider or what is the name of the office in which the patient attends annual eye exams? [facility]  If pt is not established with a provider, would they like to be referred to a provider to establish care? No .   Dental Screening: Recommended annual dental exams for proper oral hygiene  Community Resource Referral / Chronic Care Management: CRR required this visit?  No   CCM required this visit?  No      Plan:     I have personally reviewed and noted the following in the patient's chart:   . Medical and social history . Use of alcohol, tobacco or illicit drugs  . Current medications and supplements . Functional ability and  status . Nutritional status . Physical activity . Advanced directives . List of other physicians . Hospitalizations, surgeries, and ER visits in previous 12 months . Vitals . Screenings to include cognitive, depression, and falls . Referrals and appointments  In addition, I have reviewed and discussed with patient certain preventive protocols, quality metrics, and best practice recommendations. A written personalized care plan for preventive services as well as general preventive health recommendations were provided to patient.     BKate Sable LPN, LPN   [id number]  Nurse Notes: visit performed in office with provider in office. Time spent with patient 35 mins

## 2020-07-09 DIAGNOSIS — Z79891 Long term (current) use of opiate analgesic: Secondary | ICD-10-CM | POA: Diagnosis not present

## 2020-07-09 DIAGNOSIS — M5412 Radiculopathy, cervical region: Secondary | ICD-10-CM | POA: Diagnosis not present

## 2020-07-11 DIAGNOSIS — R29898 Other symptoms and signs involving the musculoskeletal system: Secondary | ICD-10-CM | POA: Diagnosis not present

## 2020-07-11 DIAGNOSIS — M4716 Other spondylosis with myelopathy, lumbar region: Secondary | ICD-10-CM | POA: Diagnosis not present

## 2020-07-11 DIAGNOSIS — J449 Chronic obstructive pulmonary disease, unspecified: Secondary | ICD-10-CM | POA: Diagnosis not present

## 2020-07-11 DIAGNOSIS — I10 Essential (primary) hypertension: Secondary | ICD-10-CM | POA: Diagnosis not present

## 2020-07-12 ENCOUNTER — Ambulatory Visit (INDEPENDENT_AMBULATORY_CARE_PROVIDER_SITE_OTHER): Payer: Medicare HMO | Admitting: Psychiatry

## 2020-07-12 ENCOUNTER — Other Ambulatory Visit: Payer: Self-pay

## 2020-07-12 DIAGNOSIS — E039 Hypothyroidism, unspecified: Secondary | ICD-10-CM | POA: Diagnosis not present

## 2020-07-12 DIAGNOSIS — J42 Unspecified chronic bronchitis: Secondary | ICD-10-CM | POA: Diagnosis not present

## 2020-07-12 DIAGNOSIS — F331 Major depressive disorder, recurrent, moderate: Secondary | ICD-10-CM

## 2020-07-12 DIAGNOSIS — E1142 Type 2 diabetes mellitus with diabetic polyneuropathy: Secondary | ICD-10-CM | POA: Diagnosis not present

## 2020-07-12 DIAGNOSIS — R2681 Unsteadiness on feet: Secondary | ICD-10-CM | POA: Diagnosis not present

## 2020-07-12 DIAGNOSIS — J449 Chronic obstructive pulmonary disease, unspecified: Secondary | ICD-10-CM | POA: Diagnosis not present

## 2020-07-12 DIAGNOSIS — M160 Bilateral primary osteoarthritis of hip: Secondary | ICD-10-CM | POA: Diagnosis not present

## 2020-07-12 DIAGNOSIS — F250 Schizoaffective disorder, bipolar type: Secondary | ICD-10-CM | POA: Diagnosis not present

## 2020-07-12 DIAGNOSIS — F17210 Nicotine dependence, cigarettes, uncomplicated: Secondary | ICD-10-CM | POA: Diagnosis not present

## 2020-07-12 DIAGNOSIS — D649 Anemia, unspecified: Secondary | ICD-10-CM | POA: Diagnosis not present

## 2020-07-12 NOTE — Assessment & Plan Note (Signed)
Managed through pain clinic 

## 2020-07-12 NOTE — Assessment & Plan Note (Signed)
Hyperlipidemia:Low fat diet discussed and encouraged.   Lipid Panel  Lab Results  Component Value Date   CHOL 125 09/23/2019   HDL 45 09/23/2019   LDLCALC 59 09/23/2019   TRIG 117 09/23/2019   CHOLHDL 3.5 09/30/2018   Updated lab needed at/ before next visit.

## 2020-07-12 NOTE — Assessment & Plan Note (Signed)
  Patient re-educated about  the importance of commitment to a  minimum of 150 minutes of exercise per week as able.  The importance of healthy food choices with portion control discussed, as well as eating regularly and within a 12 hour window most days. The need to choose "clean , green" food 50 to 75% of the time is discussed, as well as to make water the primary drink and set a goal of 64 ounces water daily.    Weight /BMI 07/07/2020 07/06/2020 06/14/2020  WEIGHT 170 lb 171 lb 170 lb 9.6 oz  HEIGHT 5\' 1"  5\' 1"  5\' 1"   BMI 32.12 kg/m2 32.31 kg/m2 32.23 kg/m2  Some encounter information is confidential and restricted. Go to Review Flowsheets activity to see all data.

## 2020-07-12 NOTE — Progress Notes (Signed)
Virtual Visit via Telephone Note  I connected with [name] on 07/12/20 at  4:00 PM EDT by telephone and verified that I am speaking with the correct person using two identifiers.  Location: Patient: Home Provider: [place] office    I discussed the limitations, risks, security and privacy concerns of performing an evaluation and management service by telephone and the availability of in person appointments. I also discussed with the patient that there may be a patient responsible charge related to this service. The patient expressed understanding and agreed to proceed.   I provided 24minutes of non-face-to-face time during this encounter.   [name], LCSW             THERAPIST PROGRESS NOTE    Session Time: Monday 07/12/2020 4:00 PM  - 4:50 PM  Participation Level: Active  Behavioral Response:  Less depressed, anxious, talkative, alert   Type of Therapy: Individual Therapy  Treatment Goals addressed: Patient wants to learn how to improve coping skills to manage chronic pain and health issues/ improve mood    Interventions: CBT and Supportive   Summary: [name] is a 70 y.o. female who presents with  long standing history of recurrent periods  of depression beginning when she was [age] and her favorite uncle died. Patient reports multiple psychiatric hospitlaizations due to depression and suicidal ideations with the last one occuring in 1997. Patient has participated in outpatient psychotherapy and medication management intermittently since age 80. She currently is seeing psychiatrist Dr. Harrington Challenger . Prior to this, she was seen at [facility]. Patient also has had ECT at [facility]. Symptoms have worsened in recent months due to family stress and have  included depressed mood, anxiety, excessive worry, and tearfulness.         Patient's last contact was by virtual visit via telephone about 3-4  weeks ago.  She states having ups and downs since last session.   She continues to express sadness and frustration regarding her health and chronic pain.  She also expresses frustration regarding her son as he is not as supportive as patient wishes.  She also expresses frustration with son as he is not as supportive of his son as patient wishes.  She worries her grandson may be suffering from depression.  Patient also reports irritability and anger.  She continues to attend church.     Suicidal/Homicidal: Nowithout intent/plan  Therapist Response: Reviewed symptoms, discussed stressors, facilitated expression of thoughts and feelings, validated feelings, discussed possible resources for her grandson and provided patient with contact numbers to give to grandson, assisted patient identify ways she is supportive to her grandson, assisted patient identify realistic expectations regarding interaction with son as well as interaction between son and her grandson, assisted patient began to identify priorities for next step for treatment, discussed focusing on managing anger and irritability as priority, developed plan with patient to list triggers of anger in preparation for next session,  Plan: Return again in 2 weeks.  Diagnosis: Axis I: MDD, Recurrent, moderate    Axis II: Deferred    Cowan Pilar E Mcarthur Ivins, LCSW

## 2020-07-12 NOTE — Assessment & Plan Note (Signed)
Managed by Endo [name] is reminded of the importance of commitment to daily physical activity for 30 minutes or more, as able and the need to limit carbohydrate intake to 30 to 60 grams per meal to help with blood sugar control.   The need to take medication as prescribed, test blood sugar as directed, and to call between visits if there is a concern that blood sugar is uncontrolled is also discussed.   [name] is reminded of the importance of daily foot exam, annual eye examination, and good blood sugar, blood pressure and cholesterol control.  Diabetic Labs Latest Ref Rng & Units 05/04/2020 03/25/2020 02/20/2020 01/28/2020 12/11/2019  HbA1c 4.0 - 5.6 % - 5.3 - - -  Microalbumin Not Estab. ug/mL - - - - -  Micro/Creat Ratio 0 - 29 mg/g creat - - - - -  Chol 100 - 199 mg/dL - - - - -  HDL >39 mg/dL - - - - -  Calc LDL 0 - 99 mg/dL - - - - -  Triglycerides 0 - 149 mg/dL - - - - -  Creatinine 0.44 - 1.00 mg/dL 1.13(H) - 1.50(H) 1.09(H) 0.94  GFR >60.00 mL/min - - - - -   BP/Weight 07/07/2020 07/06/2020 06/14/2020 05/31/2020 05/11/2020 04/28/2020 2/75/1700  Systolic BP 174 944 967 591 638 466 599  Diastolic BP 67 67 58 62 54 70 59  Wt. (Lbs) 170 171 170.6 169.6 172.2 171 170  BMI 32.12 32.31 32.23 32.05 34.78 34.54 34.34  Some encounter information is confidential and restricted. Go to Review Flowsheets activity to see all data.   Foot/eye exam completion dates Latest Ref Rng & Units 05/26/2020 01/01/2019  Eye Exam No Retinopathy No Retinopathy -  Foot exam Order - - -  Foot Form Completion - - Done

## 2020-07-13 DIAGNOSIS — M25512 Pain in left shoulder: Secondary | ICD-10-CM | POA: Diagnosis not present

## 2020-07-14 ENCOUNTER — Other Ambulatory Visit: Payer: Self-pay

## 2020-07-14 ENCOUNTER — Ambulatory Visit
Admission: RE | Admit: 2020-07-14 | Discharge: 2020-07-14 | Disposition: A | Discharge status: Home / Self Care | Payer: Medicare HMO | Source: Ambulatory Visit | Attending: Acute Care | Admitting: Acute Care

## 2020-07-14 ENCOUNTER — Ambulatory Visit (INDEPENDENT_AMBULATORY_CARE_PROVIDER_SITE_OTHER): Payer: Medicare HMO | Admitting: Acute Care

## 2020-07-14 ENCOUNTER — Encounter: Payer: Self-pay | Admitting: Acute Care

## 2020-07-14 DIAGNOSIS — F17210 Nicotine dependence, cigarettes, uncomplicated: Secondary | ICD-10-CM | POA: Diagnosis not present

## 2020-07-14 DIAGNOSIS — Z122 Encounter for screening for malignant neoplasm of respiratory organs: Secondary | ICD-10-CM

## 2020-07-14 DIAGNOSIS — Z87891 Personal history of nicotine dependence: Secondary | ICD-10-CM

## 2020-07-14 NOTE — Patient Instructions (Signed)
Thank you for participating in the [facility]. It was our pleasure to meet you today. We will call you with the results of your scan within the next few days. Your scan will be assigned a Lung RADS category score by the physicians reading the scans.  This Lung RADS score determines follow up scanning.  See below for description of categories, and follow up screening recommendations. We will be in touch to schedule your follow up screening annually or based on recommendations of our providers. We will fax a copy of your scan results to your Primary Care Physician, or the physician who referred you to the program, to ensure they have the results. Please call the office if you have any questions or concerns regarding your scanning experience or results.  Our office number is [phone]. Please speak with Denise Phelps, RN. She is our Lung Cancer Screening RN. If she is unavailable when you call, please have the office staff send her a message. She will return your call at her earliest convenience. Remember, if your scan is normal, we will scan you annually as long as you continue to meet the criteria for the program. (Age 55-77, Current smoker or smoker who has quit within the last 15 years). If you are a smoker, remember, quitting is the single most powerful action that you can take to decrease your risk of lung cancer and other pulmonary, breathing related problems. We know quitting is hard, and we are here to help.  Please let us know if there is anything we can do to help you meet your goal of quitting. If you are a former smoker, congratulations. We are proud of you! Remain smoke free! Remember you can refer friends or family members through the number above.  We will screen them to make sure they meet criteria for the program. Thank you for helping us take better care of you by participating in Lung Screening.  Lung RADS Categories:  Lung RADS 1: no nodules  or definitely non-concerning nodules.  Recommendation is for a repeat annual scan in 12 months.  Lung RADS 2:  nodules that are non-concerning in appearance and behavior with a very low likelihood of becoming an active cancer. Recommendation is for a repeat annual scan in 12 months.  Lung RADS 3: nodules that are probably non-concerning , includes nodules with a low likelihood of becoming an active cancer.  Recommendation is for a 6-month repeat screening scan. Often noted after an upper respiratory illness. We will be in touch to make sure you have no questions, and to schedule your 6-month scan.  Lung RADS 4 A: nodules with concerning findings, recommendation is most often for a follow up scan in 3 months or additional testing based on our provider's assessment of the scan. We will be in touch to make sure you have no questions and to schedule the recommended 3 month follow up scan.  Lung RADS 4 B:  indicates findings that are concerning. We will be in touch with you to schedule additional diagnostic testing based on our provider's  assessment of the scan.   

## 2020-07-14 NOTE — Progress Notes (Signed)
Virtual Visit via Telephone Note  I connected with Ankita Newcomer Pilch on 07/14/20 at 11:30 AM EDT by telephone and verified that I am speaking with the correct person using two identifiers.  Location: Patient: At home Provider: [place], [place], [place], [address]   I discussed the limitations, risks, security and privacy concerns of performing an evaluation and management service by telephone and the availability of in person appointments. I also discussed with the patient that there may be a patient responsible charge related to this service. The patient expressed understanding and agreed to proceed.    Shared Decision Making Visit Lung Cancer Screening Program [phone])   Eligibility:  Age 70 y.o.  Pack Years Smoking History Calculation 33 pack years (# packs/per year x # years smoked)  Recent History of coughing up blood  no  Unexplained weight loss? no ( >Than 15 pounds within the last 6 months )  Prior History Lung / other cancer no (Diagnosis within the last 5 years already requiring surveillance chest CT Scans).  Smoking Status Current Smoker  Former Smokers: Years since quit: NA  Quit Date: NA  Visit Components:  Discussion included one or more decision making aids. yes  Discussion included risk/benefits of screening. yes  Discussion included potential follow up diagnostic testing for abnormal scans. yes  Discussion included meaning and risk of over diagnosis. yes  Discussion included meaning and risk of False Positives. yes  Discussion included meaning of total radiation exposure. yes  Counseling Included:  Importance of adherence to annual lung cancer LDCT screening. yes  Impact of comorbidities on ability to participate in the program. yes  Ability and willingness to under diagnostic treatment. yes  Smoking Cessation Counseling:  Current Smokers:   Discussed importance of smoking cessation. yes  Information about tobacco cessation  classes and interventions provided to patient. yes  Patient provided with "ticket" for LDCT Scan. yes  Symptomatic Patient. no  Counseling  Diagnosis Code: Tobacco Use Z72.0  Asymptomatic Patient yes  Counseling (Intermediate counseling: > three minutes counseling) U0454  Former Smokers:   Discussed the importance of maintaining cigarette abstinence. yes  Diagnosis Code: Personal History of Nicotine Dependence. U98.119  Information about tobacco cessation classes and interventions provided to patient. Yes  Patient provided with "ticket" for LDCT Scan. yes  Written Order for Lung Cancer Screening with LDCT placed in Epic. Yes (CT Chest Lung Cancer Screening Low Dose W/O CM) [id number] Z12.2-Screening of respiratory organs Z87.891-Personal history of nicotine dependence  I have spent 25 minutes of face to face time with Ms. Manolis discussing the risks and benefits of lung cancer screening. We viewed a power point together that explained in detail the above noted topics. We paused at intervals to allow for questions to be asked and answered to ensure understanding.We discussed that the single most powerful action that she can take to decrease her risk of developing lung cancer is to quit smoking. We discussed whether or not she is ready to commit to setting a quit date. We discussed options for tools to aid in quitting smoking including nicotine replacement therapy, non-nicotine medications, support groups, Quit Smart classes, and behavior modification. We discussed that often times setting smaller, more achievable goals, such as eliminating 1 cigarette a day for a week and then 2 cigarettes a day for a week can be helpful in slowly decreasing the number of cigarettes smoked. This allows for a sense of accomplishment as well as providing a clinical benefit. I gave her  the " Be Stronger Than Your Excuses" card with contact information for community resources, classes, free nicotine replacement  therapy, and access to mobile apps, text messaging, and on-line smoking cessation help. I have also given her my card and contact information in the event she needs to contact me. We discussed the time and location of the scan, and that either Doroteo Glassman RN or I will call with the results within 24-48 hours of receiving them. I have offered her  a copy of the power point we viewed  as a resource in the event they need reinforcement of the concepts we discussed today in the office. The patient verbalized understanding of all of  the above and had no further questions upon leaving the office. They have my contact information in the event they have any further questions.  I spent 3-4 minutes counseling on smoking cessation and the health risks of continued tobacco abuse.  I explained to the patient that there has been a high incidence of coronary artery disease noted on these exams. I explained that this is a non-gated exam therefore degree or severity cannot be determined. This patient is currently on statin therapy. I have asked the patient to follow-up with their PCP regarding any incidental finding of coronary artery disease and management with diet or medication as their PCP  feels is clinically indicated. The patient verbalized understanding of the above and had no further questions upon completion of the visit.      Magdalen Spatz, NP 07/14/2020

## 2020-07-17 IMAGING — DX DG ABDOMEN 2V
2 series · 3 of 3 positions shown · non-contrast
Comparison: December 07, 2018

CLINICAL DATA: Severe constipation.

EXAM:
ABDOMEN - 2 VIEW

[Series 1: abdomen erect · 0.14mm/px · 2 of 2 slices shown]
[im 1/2]
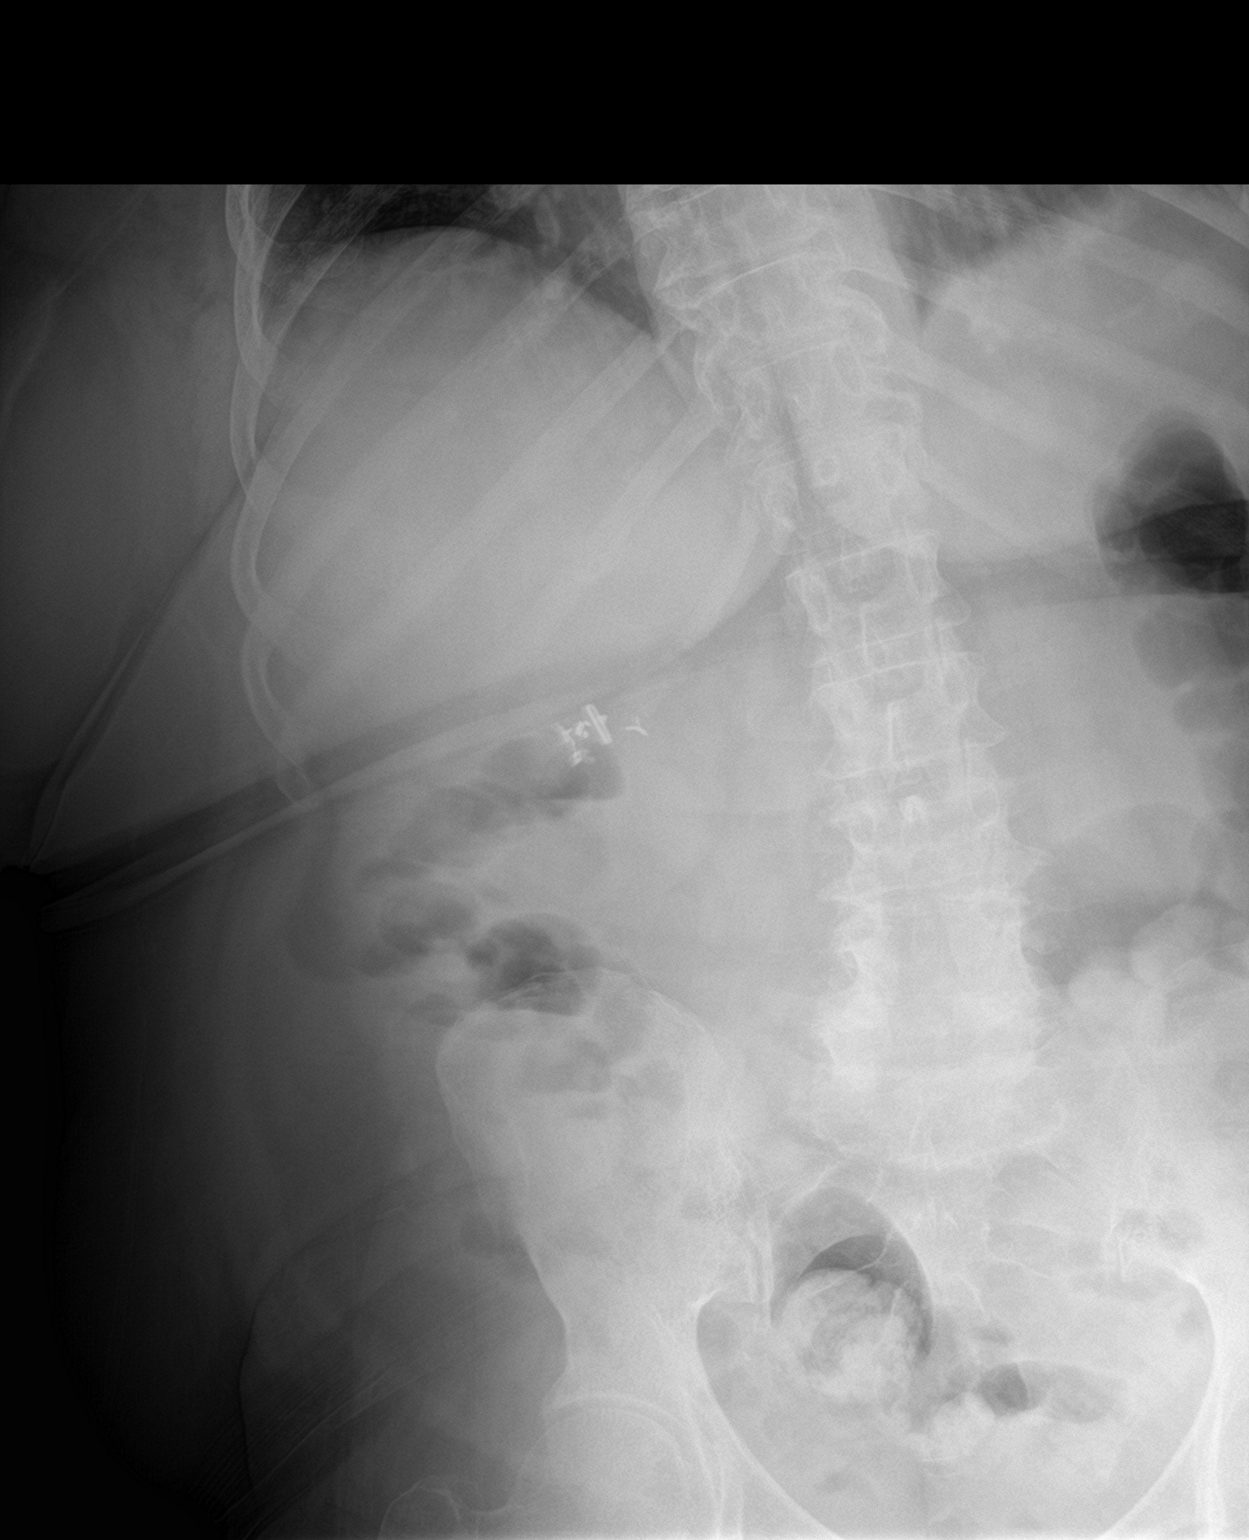
[im 2/2]
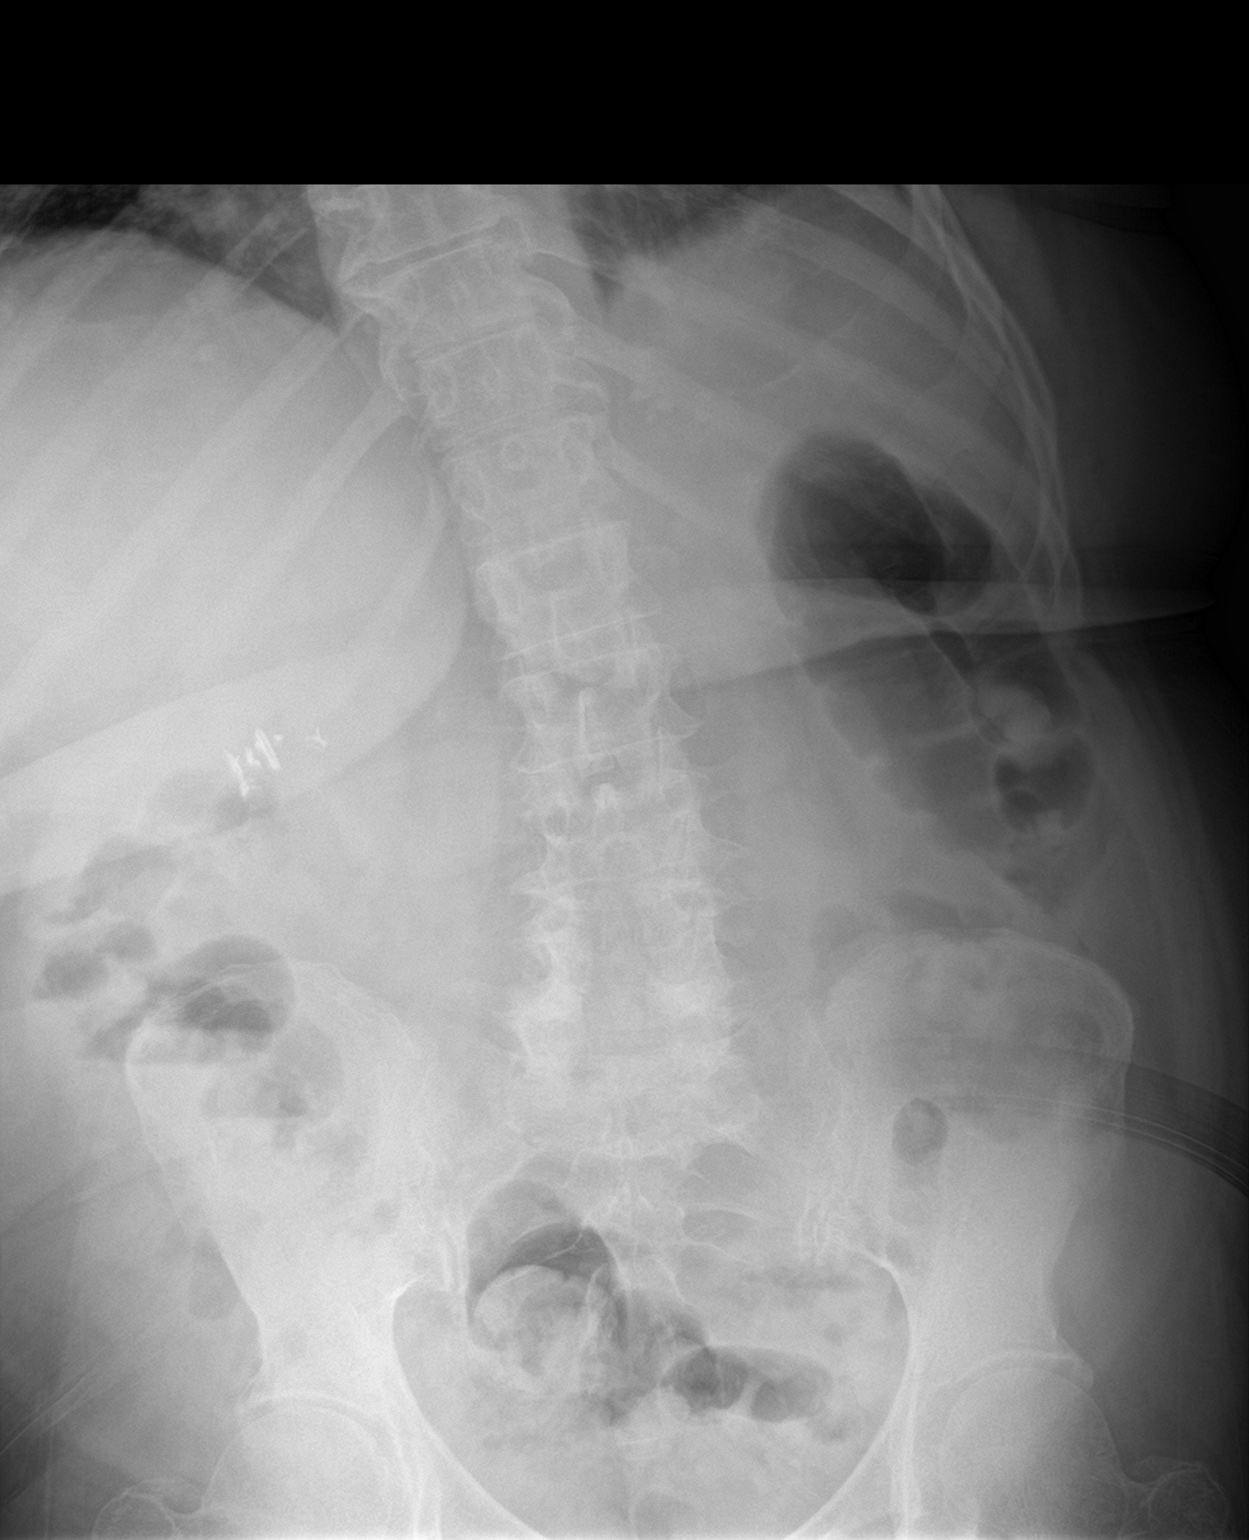

[abdomen supine]
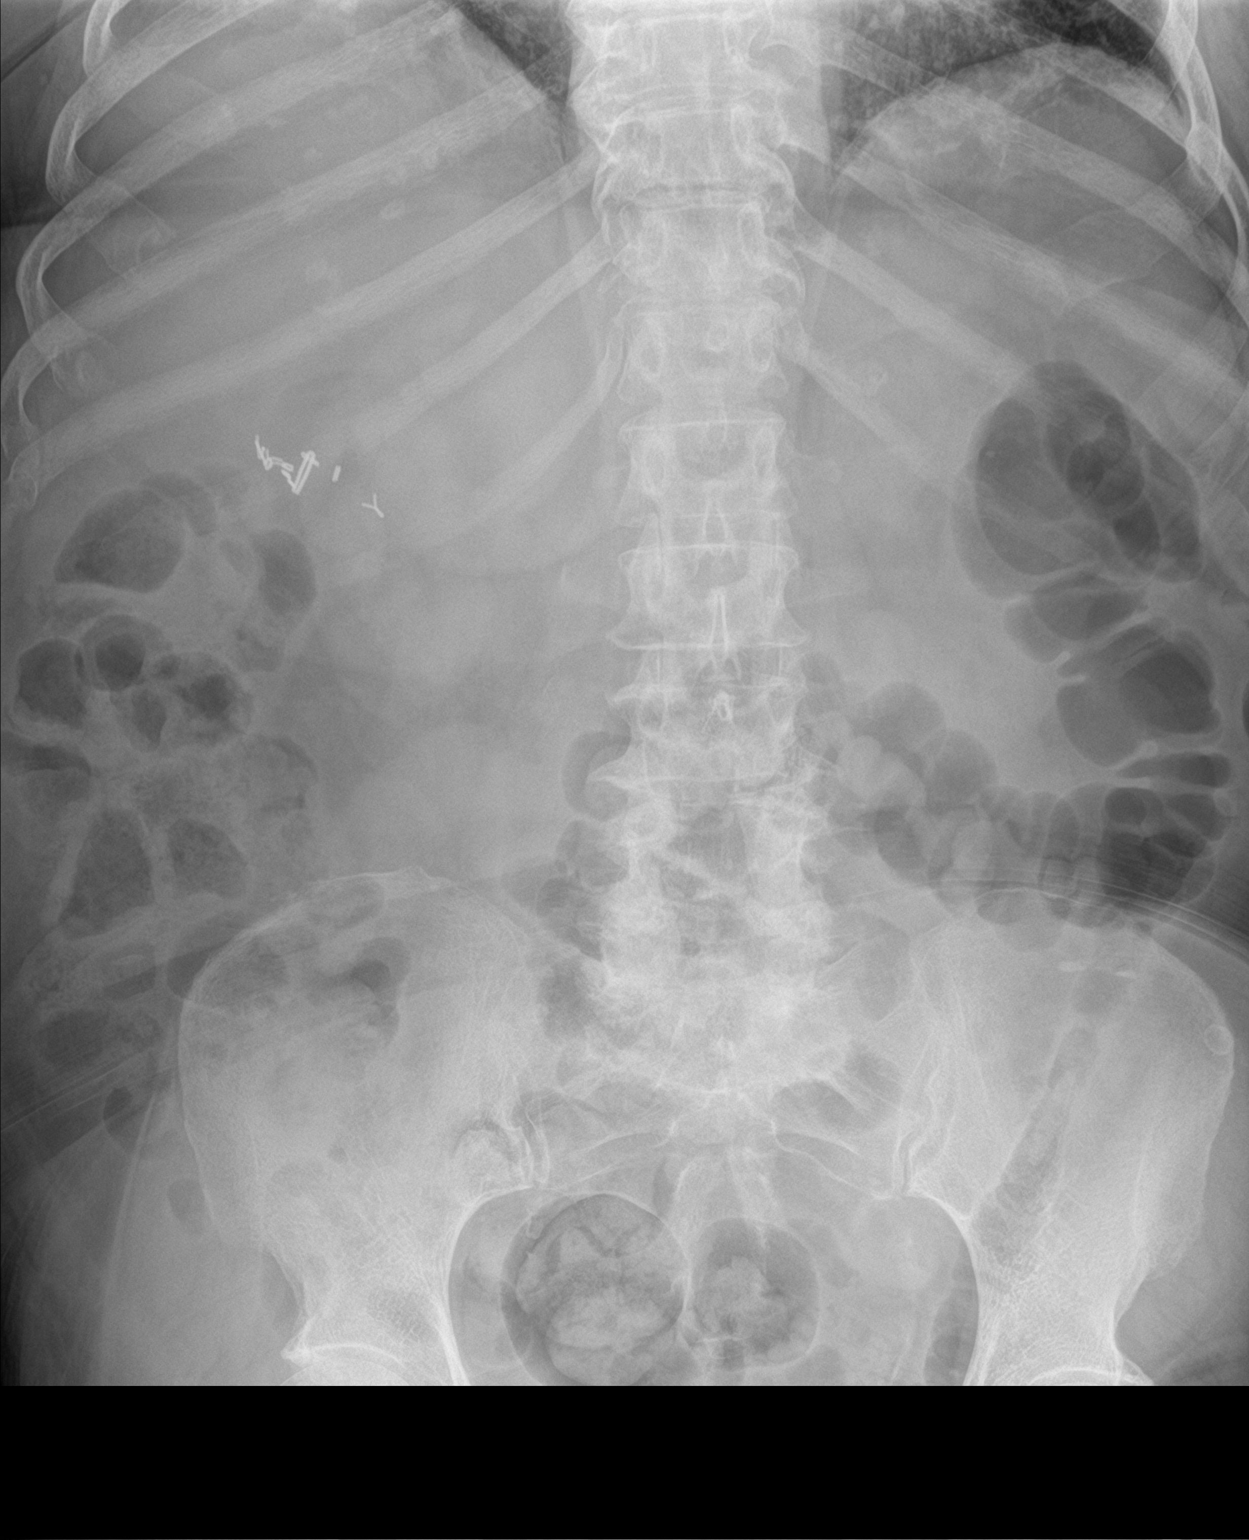

[3 of 3 positions shown; findings below may reference images not displayed]

FINDINGS: The bowel gas pattern is normal. There is no evidence of free air.
Moderate to large amount of formed stool throughout the colon. No
radio-opaque calculi or other significant radiographic abnormality
is seen.

Cholecystectomy clips present.
IMPRESSION: 1. Nonobstructive bowel gas pattern.
2. Moderate to large amount of formed stool throughout the colon.

## 2020-07-19 ENCOUNTER — Telehealth (HOSPITAL_COMMUNITY): Payer: Medicare HMO | Admitting: Psychiatry

## 2020-07-20 ENCOUNTER — Other Ambulatory Visit: Payer: Self-pay

## 2020-07-20 NOTE — Patient Outreach (Signed)
[facility]) Care Management  07/20/2020  [name] 01/16/1951 [id number]   Telephone call to patient for disease management follow up. No answer. Unable to leave a message.  Plan: RN CM will attempt again in the month of August and send letter.  Jone Baseman, RN, MSN [place] [facility] Care Management Coordinator Direct Line [phone] Cell [phone] Toll Free: [phone]  Fax: [phone]

## 2020-07-21 ENCOUNTER — Ambulatory Visit: Payer: Self-pay

## 2020-07-21 DIAGNOSIS — F250 Schizoaffective disorder, bipolar type: Secondary | ICD-10-CM | POA: Diagnosis not present

## 2020-07-21 DIAGNOSIS — R2681 Unsteadiness on feet: Secondary | ICD-10-CM | POA: Diagnosis not present

## 2020-07-21 DIAGNOSIS — J449 Chronic obstructive pulmonary disease, unspecified: Secondary | ICD-10-CM | POA: Diagnosis not present

## 2020-07-21 DIAGNOSIS — E1142 Type 2 diabetes mellitus with diabetic polyneuropathy: Secondary | ICD-10-CM | POA: Diagnosis not present

## 2020-07-21 DIAGNOSIS — D649 Anemia, unspecified: Secondary | ICD-10-CM | POA: Diagnosis not present

## 2020-07-21 DIAGNOSIS — F17210 Nicotine dependence, cigarettes, uncomplicated: Secondary | ICD-10-CM | POA: Diagnosis not present

## 2020-07-21 DIAGNOSIS — E039 Hypothyroidism, unspecified: Secondary | ICD-10-CM | POA: Diagnosis not present

## 2020-07-21 DIAGNOSIS — J42 Unspecified chronic bronchitis: Secondary | ICD-10-CM | POA: Diagnosis not present

## 2020-07-21 DIAGNOSIS — M160 Bilateral primary osteoarthritis of hip: Secondary | ICD-10-CM | POA: Diagnosis not present

## 2020-07-22 ENCOUNTER — Other Ambulatory Visit: Payer: Self-pay | Admitting: *Deleted

## 2020-07-22 DIAGNOSIS — Z87891 Personal history of nicotine dependence: Secondary | ICD-10-CM

## 2020-07-22 DIAGNOSIS — F17210 Nicotine dependence, cigarettes, uncomplicated: Secondary | ICD-10-CM

## 2020-07-22 NOTE — Progress Notes (Signed)
Please call patient and let them  know their  low dose Ct was read as a Lung RADS 1, negative study: no nodules or definitely benign nodules. Radiology recommendation is for a repeat LDCT in 12 months. .Please let them  know we will order and schedule their  annual screening scan for 07/2021. Please let them  know there was notation of CAD on their  scan.  Please remind the patient  that this is a non-gated exam therefore degree or severity of disease  cannot be determined. Please have them  follow up with their PCP regarding potential risk factor modification, dietary therapy or pharmacologic therapy if clinically indicated. Pt.  is currently on statin therapy. Please place order for annual  screening scan for  07/2021 and fax results to PCP. Thanks so much. 

## 2020-07-26 ENCOUNTER — Other Ambulatory Visit: Payer: Self-pay

## 2020-07-26 ENCOUNTER — Ambulatory Visit (INDEPENDENT_AMBULATORY_CARE_PROVIDER_SITE_OTHER): Payer: Medicare HMO | Admitting: Psychiatry

## 2020-07-26 DIAGNOSIS — M160 Bilateral primary osteoarthritis of hip: Secondary | ICD-10-CM | POA: Diagnosis not present

## 2020-07-26 DIAGNOSIS — F331 Major depressive disorder, recurrent, moderate: Secondary | ICD-10-CM

## 2020-07-26 DIAGNOSIS — D649 Anemia, unspecified: Secondary | ICD-10-CM | POA: Diagnosis not present

## 2020-07-26 DIAGNOSIS — E039 Hypothyroidism, unspecified: Secondary | ICD-10-CM | POA: Diagnosis not present

## 2020-07-26 DIAGNOSIS — R2681 Unsteadiness on feet: Secondary | ICD-10-CM | POA: Diagnosis not present

## 2020-07-26 DIAGNOSIS — F17210 Nicotine dependence, cigarettes, uncomplicated: Secondary | ICD-10-CM | POA: Diagnosis not present

## 2020-07-26 DIAGNOSIS — J449 Chronic obstructive pulmonary disease, unspecified: Secondary | ICD-10-CM | POA: Diagnosis not present

## 2020-07-26 DIAGNOSIS — J42 Unspecified chronic bronchitis: Secondary | ICD-10-CM | POA: Diagnosis not present

## 2020-07-26 DIAGNOSIS — E1142 Type 2 diabetes mellitus with diabetic polyneuropathy: Secondary | ICD-10-CM | POA: Diagnosis not present

## 2020-07-26 DIAGNOSIS — F250 Schizoaffective disorder, bipolar type: Secondary | ICD-10-CM | POA: Diagnosis not present

## 2020-07-26 NOTE — Progress Notes (Signed)
Virtual Visit via Telephone Note  I connected with [name] on 07/26/20 at 4:15 PM EDT by telephone and verified that I am speaking with the correct person using two identifiers.  Location: Patient: Home Provider: [place] office    I discussed the limitations, risks, security and privacy concerns of performing an evaluation and management service by telephone and the availability of in person appointments. I also discussed with the patient that there may be a patient responsible charge related to this service. The patient expressed understanding and agreed to proceed.   I provided 44 minutes of non-face-to-face time during this encounter.   [name], LCSW            THERAPIST PROGRESS NOTE    Session Time: Monday 07/26/2020 4:15 PM - 4:59 PM Participation Level: Active  Behavioral Response:  Less depressed, anxious, talkative, alert   Type of Therapy: Individual Therapy  Treatment Goals addressed: Patient wants to learn how to improve coping skills to manage chronic pain and health issues/ improve mood    Interventions: CBT and Supportive   Summary: [name] is a 70 y.o. female who presents with  long standing history of recurrent periods  of depression beginning when she was [age] and her favorite uncle died. Patient reports multiple psychiatric hospitlaizations due to depression and suicidal ideations with the last one occuring in 1997. Patient has participated in outpatient psychotherapy and medication management intermittently since age 77. She currently is seeing psychiatrist Dr. Harrington Challenger . Prior to this, she was seen at [facility]. Patient also has had ECT at [facility]. Symptoms have worsened in recent months due to family stress and have  included depressed mood, anxiety, excessive worry, and tearfulness.         Patient's last contact was by virtual visit via telephone about 3-4  weeks ago.  She reports improved mood and decreased irritability since  last session.  She reports breathing [place] books, setting and maintaining limits with a friend, and attending her grandson's graduation have been helpful.  Patient also is pleased she has not smoked a cigarette in the past 7 weeks.  She continues to express frustration and anger regarding maternal and paternal relatives.  She reports still sometimes being aggressive in her communication.  Patient reports still attending church.     Suicidal/Homicidal: Nowithout intent/plan      Therapist Response: Reviewed symptoms, praised and reinforced patient's increased involvement in activity/setting-maintaining limits, discussed effects, praised and reinforced patient's efforts to quit smoking, discussed patient's triggers of anger, assisted patient examine her pattern of interaction with her relatives, began to discuss assertive communication versus aggressive communication  Plan: Return again in 2 weeks.  Diagnosis: Axis I: MDD, Recurrent, moderate    Axis II: Deferred    [name] Menken E Vale Peraza, LCSW

## 2020-07-27 ENCOUNTER — Ambulatory Visit: Payer: Medicare HMO | Admitting: Internal Medicine

## 2020-07-29 ENCOUNTER — Encounter: Payer: Self-pay | Admitting: Internal Medicine

## 2020-07-29 ENCOUNTER — Ambulatory Visit: Payer: Medicare HMO | Admitting: Internal Medicine

## 2020-07-29 ENCOUNTER — Other Ambulatory Visit: Payer: Self-pay

## 2020-07-29 ENCOUNTER — Ambulatory Visit (INDEPENDENT_AMBULATORY_CARE_PROVIDER_SITE_OTHER): Payer: Medicare HMO | Admitting: Internal Medicine

## 2020-07-29 VITALS — BP 150/68 | HR 69 | Ht 61.0 in | Wt 172.2 lb

## 2020-07-29 DIAGNOSIS — F250 Schizoaffective disorder, bipolar type: Secondary | ICD-10-CM | POA: Diagnosis not present

## 2020-07-29 DIAGNOSIS — E1165 Type 2 diabetes mellitus with hyperglycemia: Secondary | ICD-10-CM

## 2020-07-29 DIAGNOSIS — E042 Nontoxic multinodular goiter: Secondary | ICD-10-CM

## 2020-07-29 DIAGNOSIS — M160 Bilateral primary osteoarthritis of hip: Secondary | ICD-10-CM | POA: Diagnosis not present

## 2020-07-29 DIAGNOSIS — E1142 Type 2 diabetes mellitus with diabetic polyneuropathy: Secondary | ICD-10-CM | POA: Diagnosis not present

## 2020-07-29 DIAGNOSIS — E039 Hypothyroidism, unspecified: Secondary | ICD-10-CM

## 2020-07-29 DIAGNOSIS — J449 Chronic obstructive pulmonary disease, unspecified: Secondary | ICD-10-CM | POA: Diagnosis not present

## 2020-07-29 DIAGNOSIS — Z794 Long term (current) use of insulin: Secondary | ICD-10-CM | POA: Diagnosis not present

## 2020-07-29 DIAGNOSIS — D649 Anemia, unspecified: Secondary | ICD-10-CM | POA: Diagnosis not present

## 2020-07-29 DIAGNOSIS — F17210 Nicotine dependence, cigarettes, uncomplicated: Secondary | ICD-10-CM | POA: Diagnosis not present

## 2020-07-29 DIAGNOSIS — R2681 Unsteadiness on feet: Secondary | ICD-10-CM | POA: Diagnosis not present

## 2020-07-29 DIAGNOSIS — J42 Unspecified chronic bronchitis: Secondary | ICD-10-CM | POA: Diagnosis not present

## 2020-07-29 LAB — POCT GLYCOSYLATED HEMOGLOBIN (HGB A1C): Hemoglobin A1C: 5.8 % — AB (ref 4.0–5.6)

## 2020-07-29 MED ORDER — LEVOTHYROXINE SODIUM 50 MCG PO TABS: ORAL_TABLET | ORAL | 3 refills | Status: DC

## 2020-07-29 NOTE — Progress Notes (Signed)
Patient ID: [name], female   DOB: Aug 20, 1950, 70 y.o.   MRN: [id number]  This visit occurred during the SARS-CoV-2 public health emergency.  Safety protocols were in place, including screening questions prior to the visit, additional usage of staff PPE, and extensive cleaning of exam room while observing appropriate contact time as indicated for disinfecting solutions.   HPI: [name] is a 70 y.o.-year-old female, initially referred by her PCP, [name], presenting for follow-up for DM2, dx 1995, insulin-dependent since 1997, uncontrolled, with complications (gastroparesis, cerebrovasc. Ds-  H/o stroke, PN),  Hypothyroidism, thyroid nodules.  Last visit 4 months ago.  Interim history: She had a R ankle fracture 02/20/2020.  At last visit, this was still painful and has a brace on.  It is not healed yet >> has a bone stimulator. She has been getting steroid injections in shoulder. She sees Pain Medicine. She has neck pain and HA and also dysequilibrium.  She is in physical therapy.  DM2: Reviewed HbA1c levels: Lab Results  Component Value Date   HGBA1C 5.3 03/25/2020   HGBA1C 5.9 (A) 11/06/2019   HGBA1C 6.4 (A) 06/24/2019  10/28/2013: HbA1c 8.5%  She is on: - Toujeo 23 >> 28 units at bedtime - Novolog: 10-14 >> 16-18 units before a meal Could not tolerate Metformin >> nausea.   Insulin doses were decreased in 04/2017 due to low blood sugars in the 40s.  Pt checks her sugars 4 times a day per review of her log: - am:  71, 89-169, 184 >> 64, 95-146, 160, 170 >> 46 (2 mo ago), 60, 81-169, 199, 228 - 2h after b'fast: n/c >> 110 >> n/c - before lunch: 94-183, 241, 264 >> 79, 89-153, 178, 265 >> 69-153, 166, 214 - 2h after lunch:  n/c >> 127, 215, 241 >> n/c - before dinner:  101-171, 269, 378, 542 (steroids) >> 80-173, 181, 223 >> 70-185, 241-282 with steroids - 2h after dinner: n/c - bedtime:  89-201, 246, 488 (steroids) >> 91, 121-85, 204, 262 >> 99-272, 365 - nighttime: n/c  >> 134 >> n/c Lowest sugar was 79 >> 61 x1 >> 71 >> 64 >> 40s; she has hypoglycemia awareness in the 70s. Highest sugar was 241 >> 422  >> 542 (steroid inj) >> 265 >> 365 (steroids)  Pt's meals are: - Breakfast: eggs, cereals, fruit, oatmeal, bacon - Lunch: sandwich, beef, mashed potatoes,diet soda - Dinner: chicken, beef, fish, potatoes, vegetables - Snacks: 1-2: fruit  -+ Mild CKD; last BUN/creatinine:  Lab Results  Component Value Date   BUN 28 (H) 05/04/2020   CREATININE 1.13 (H) 05/04/2020  On losartan. -+ HL; last set of lipids: Lab Results  Component Value Date   CHOL 125 09/23/2019   HDL 45 09/23/2019   LDLCALC 59 09/23/2019   TRIG 117 09/23/2019   CHOLHDL 3.5 09/30/2018  On Crestor and Zetia - last eye exam was in 2021: + DR; Had cataract sx B. Dr. Hassell Done [facility]). + L eye pain. -She has numbness and tingling mainly in the left leg-affected by stroke. Sees podiatry.  Hypothyroidism: -Diagnosed many years ago  She takes levothyroxine 50 mcg daily: - in am - fasting - at least 30 min from b'fast - no calcium - no iron - + multivitamins with lunch and Aciphex with lunch and dinner - no PPIs - +on Biotin and B complex  Reviewed her TFTs: Lab Results  Component Value Date   TSH 0.631 12/11/2019   TSH 0.94 02/17/2019  TSH 0.82 09/30/2018   TSH 0.52 10/08/2017   TSH 0.827 10/15/2016   Thyroid nodules.    Pt denies: - feeling nodules in neck - hoarseness - SOB with lying down But she continues to have chronic dysphagia with certain foods.  She had previous esophageal dilations. She also has choking.  I reviewed previous imaging and biopsy tests: thyroid U/S (10/2013):  Right thyroid lobe: 3.7 x 1.0 x 1.8 cm   Left thyroid lobe: 3.5 x 1.5 x 1.6 cm   Isthmus: 0.5 cm   Focal nodules: There is a 0.3 mm calcified nodule in the right midzone. There is a 0.6 x 0.7 x 0.5 cm hypoechoic nodule in the  lateral aspect of the right midzone. There is a 0.7 x 0.6  x 0.5 cm hyperechoic nodule in the lateral aspect of the right lower pole.  There is a 2.2 x 1.1 x 1.7 cm complex solid nodule in the inferior aspect of the isthmus extending adjacent to the left lower pole.  There is a 1.4 x 2.1 x 1.4 cm complex solid nodule in the left lower pole.   Lymphadenopathy: None visualized.  Repeat U/S (01/2014): Stable appearance of the nodules  FNA (2014) x2: Benign  Repeat U/S (10/2015): stable appearance of the nodules  Repeat U/S (10/2016): Stable appearance of the nodules  She had a L rib fracture in 12/2014.  Patient was admitted with AMS + sepsis on 10/15/2016.  She had aspiration pneumonia and acute respiratory failure  She has a recurrent meningioma >> seeing Dr. Rita Ohara.  She continues to have steroid injections in her left shoulder.  ROS: Constitutional: no weight gain/no weight loss, no fatigue, no subjective hyperthermia, no subjective hypothermia, + increased urination Eyes: no blurry vision, no xerophthalmia ENT: no sore throat, + see HPI Cardiovascular: no CP/no SOB/no palpitations/no leg swelling Respiratory: no cough/no SOB/no wheezing Gastrointestinal: no N/no V/no D/no C/no acid reflux Musculoskeletal: no muscle aches/+ joint aches Skin: no rashes, no hair loss Neurological: no tremors/+ numbness/+ tingling/no dizziness  I reviewed pt's medications, allergies, PMH, social hx, family hx, and changes were documented in the history of present illness. Otherwise, unchanged from my initial visit note.  Past Medical History:  Diagnosis Date  . Allergy   . Anemia   . Anxiety    takes Ativan daily  . Arthritis   . Assistance needed for mobility   . Bipolar disorder ([place])    takes Risperdal nightly  . Blood transfusion   . Brain tumor ([place])   . Cancer ([place])    In her gum  . Carpal tunnel syndrome of right wrist 05/23/2011  . Cervical disc disorder with radiculopathy of cervical region 10/31/2012  . Chronic back pain   .  Chronic idiopathic constipation   . Chronic neck and back pain   . Colon polyps   . COPD (chronic obstructive pulmonary disease) with chronic bronchitis ([place]) 09/16/2013   Office Spirometry 10/30/2013-submaximal effort based on appearance of loop and curve. Numbers would fit with severe restriction but her physiologic capability may be better than this. FVC 0.91/44%, and 10.74/45%, FEV1/FVC 0.81, FEF 25-75% 1.43/69%    . Diabetes mellitus    Type II  . Diverticulosis    TCS 9/08 by Dr. Delfin Edis for diarrhea . Bx for micro scopic colitis negative.   . Fibromyalgia   . Frequent falls   . GERD (gastroesophageal reflux disease)    takes Aciphex daily  . Glaucoma    eye drops  daily  . Gum symptoms    infection on antibiotic  . Hemiplegia affecting non-dominant side, post-stroke 08/02/2011  . Hiatal hernia   . Hyperlipidemia    takes Crestor daily  . Hypertension    takes Amlodipine,Metoprolol,and Clonidine daily  . Hypothyroidism    takes Synthroid daily  . IBS (irritable bowel syndrome)   . Insomnia    takes Trazodone nightly  . Major depression, recurrent ([place])    takes Zoloft daily  . 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 Metabolic encephalopathy 5/49/8264  . Migraines    chronic headaches  . Mononeuritis lower limb   . Narcolepsy   . Osteoporosis   . Pancreatitis 2006   due to Depakote with normal EUS   . Schatzki's ring    non critical / EGD with ED 8/2011with RMR  . Seizures ([place])    takes Lamictal daily.Last seizure 3 yrs ago  . Sleep apnea    on CPAP  . Small bowel obstruction ([place])   . Stroke [facility])    left sided weakness, speech changes  . Tubular adenoma of colon    Past Surgical History:  Procedure Laterality Date  . ABDOMINAL HYSTERECTOMY  1978  . BACK SURGERY  July 2012  . BACTERIAL OVERGROWTH TEST N/A 05/05/2013   Procedure: BACTERIAL OVERGROWTH TEST;  Surgeon: Daneil Dolin, MD;  Location[facility];  Service: Endoscopy;  Laterality: N/A;  7:30  .  BIOPSY THYROID  2009  . BRAIN SURGERY  11/2011   resection of meningioma  . BREAST REDUCTION SURGERY  1994  . CARDIAC CATHETERIZATION  05/10/2005   normal coronaries, normal LV systolic function and EF (Dr. Jackie Plum)  . CARPAL TUNNEL RELEASE Left 07/22/04   Dr. Aline Brochure  . CATARACT EXTRACTION Bilateral   . CHOLECYSTECTOMY  1984  . COLONOSCOPY N/A 09/25/2012   BRA:XENMMHW diverticulosis.  colonic polyp-removed : tubular adenoma  . CRANIOTOMY  11/23/2011   Procedure: CRANIOTOMY TUMOR EXCISION;  Surgeon: Hosie Spangle, MD;  Location: [place] [facility]URO ORS;  Service: Neurosurgery;  Laterality: N/A;  Craniotomy for tumor resection  . ESOPHAGOGASTRODUODENOSCOPY  12/29/2010   Rourk-Retained food in the esophagus and stomach, small hiatal hernia, status post Maloney dilation of the esophagus  . ESOPHAGOGASTRODUODENOSCOPY N/A 09/25/2012   KGS:UPJSRPRX atonic baggy esophagus status post Maloney dilation 55 F. Hiatal hernia  . GIVENS CAPSULE STUDY N/A 01/15/2013   NORMAL.   . IR GENERIC HISTORICAL  03/17/2016   IR RADIOLOGIST EVAL & MGMT 03/17/2016 [facility]-INTERV RAD  . LESION REMOVAL N/A 05/31/2015   Procedure: REMOVAL RIGHT AND LEFT LESIONS OF MANDIBLE;  Surgeon: Diona Browner, DDS;  Location: [place];  Service: Oral Surgery;  Laterality: N/A;  . MALONEY DILATION  12/29/2010   RMR;  . NM MYOCAR PERF WALL MOTION  2006   "relavtiely normal" persantine, mild anterior thinning (breast attenuation artifact), no region of scar/ischemia  . OVARIAN CYST REMOVAL    . RECTOCELE REPAIR N/A 06/29/2015   Procedure: POSTERIOR REPAIR (RECTOCELE);  Surgeon: Jonnie Kind, MD;  Location: [facility];  Service: Gynecology;  Laterality: N/A;  . REDUCTION MAMMAPLASTY Bilateral   . SPINE SURGERY  09/29/2010   Dr. Rolena Infante  . surgical excision of 3 tumors from right thigh and right buttock  and left upper thigh  2010  . TOOTH EXTRACTION Bilateral 12/14/2014   Procedure: REMOVAL OF BILATERAL MANDIBULAR EXOSTOSES;  Surgeon: Diona Browner, DDS;   Location: [place];  Service: Oral Surgery;  Laterality: Bilateral;  . TRANSTHORACIC ECHOCARDIOGRAM  2010   EF 60-65%, mild conc LVH, grade 1 diastolic dysfunction; mildly calcified MV annulus with mildly thickened leaflets, mildly calcified MR annulus   History   Social History  . Marital Status: Divorced    Spouse Name: N/A    Number of Children: 1  . Years of Education: 12   Occupational History  . disabled     Social History Main Topics  . Smoking status: Current Every Day Smoker -- 0.25 packs/day for 7 years    Types: Cigarettes  . Smokeless tobacco: Never Used     Comment: "started back but off now for 3 months" (08/18/13)  . Alcohol Use: No     Comment:    . Drug Use: No   Current Outpatient Medications on File Prior to Visit  Medication Sig Dispense Refill  . Accu-Chek Softclix Lancets lancets TEST BLOOD SUGAR THREE TIMES DAILY AS DIRECTED 300 each 2  . albuterol (VENTOLIN HFA) 108 (90 Base) MCG/ACT inhaler INHALE 1 TO 2 PUFFS EVERY 6 HOURS AS NEEDED FOR WHEEZING, SHORTNESS OF BREATH 54 each 1  . Alcohol Swabs (B-D SINGLE USE SWABS REGULAR) PADS USE TO CLEAN FINGER PRIOR TO STICKING FOR BLOOD SUGAR. 100 each 11  . alendronate (FOSAMAX) 70 MG tablet TAKE 1 TABLET (70 MG TOTAL) BY MOUTH EVERY 7 (SEVEN) DAYS. TAKE WITH A FULL GLASS OF WATER ON AN EMPTY STOMACH. 12 tablet 3  . amLODipine (NORVASC) 10 MG tablet TAKE 1 TABLET EVERY DAY 90 tablet 3  . ascorbic acid (VITAMIN C) 500 MG tablet Take 500 mg by mouth daily.    [name] aspirin EC 81 MG tablet Take 1 tablet (81 mg total) by mouth daily with breakfast. 120 tablet 2  . betamethasone dipropionate 0.05 % cream Apply topically 2 (two) times daily as needed (Rash). 45 g 3  . Blood Glucose Calibration (ACCU-CHEK GUIDE CONTROL) LIQD USE PRN TO CALIBRATE GLUCOMETER 3 each 11  . blood glucose meter kit and supplies Dispense based on patient and insurance preference. Use up to four times daily as directed. (FOR ICD-10 E10.9, E11.9). 1 each 0   . buPROPion (WELLBUTRIN XL) 150 MG 24 hr tablet Take 1 tablet (150 mg total) by mouth every morning. 90 tablet 2  . cetirizine (ZYRTEC) 10 MG tablet Take 1 tablet (10 mg total) by mouth daily. 7 tablet 0  . clobetasol cream (TEMOVATE) 0.93 % Apply 1 application topically 2 (two) times daily.     . Continuous Blood Gluc Sensor (FREESTYLE LIBRE 14 DAY SENSOR) MISC 1 each by Does not apply route every 14 (fourteen) days. Change every 2 weeks 2 each 11  . cromolyn (NASALCROM) 5.2 MG/ACT nasal spray Place 1 spray into both nostrils 4 (four) times daily. 13 mL 1  . diclofenac Sodium (VOLTAREN) 1 % GEL APPLY 2 GRAMS  TOPICALLY 4 (FOUR) TIMES DAILY. 800 g 0  . dicyclomine (BENTYL) 10 MG capsule Take 1 capsule (10 mg total) by mouth 3 (three) times daily as needed for spasms. 90 capsule 2  . diphenhydrAMINE (BENADRYL) 50 MG tablet Take 1 tablet (50 mg total) by mouth 2 (two) times daily as needed for itching. 30 tablet 0  . ezetimibe (ZETIA) 10 MG tablet Take 1 tablet (10 mg total) by mouth daily. 90 tablet 1  . famotidine (PEPCID) 20 MG tablet Take 1 tablet (20 mg total) by mouth 2 (two) times daily. 14 tablet 0  . glucose blood (ACCU-CHEK GUIDE) test strip USE TO CHECK BLOOD SUGAR FOUR  TIMES A DAY AND PRN 400 each 11  . insulin aspart (NOVOLOG FLEXPEN) 100 UNIT/ML FlexPen Inject 16 to 18 units under skin before meals up to 3 times a day 45 mL 3  . Insulin Pen Needle (SURE COMFORT PEN NEEDLES) 31G X 8 MM MISC USE TO for injecting insulin 4 TIMES DAILY. 200 each 3  . lamoTRIgine (LAMICTAL) 100 MG tablet Take 1 tablet (100 mg total) by mouth 2 (two) times daily. 180 tablet 2  . levothyroxine (SYNTHROID) 50 MCG tablet TAKE 1 TABLET DAILY, EXCEPT TAKE 1/2 TABLET ON SUNDAY 85 tablet 1  . LORazepam (ATIVAN) 0.5 MG tablet Take 1 tablet (0.5 mg total) by mouth 2 (two) times daily. 180 tablet 0  . losartan (COZAAR) 50 MG tablet Take 1 tablet (50 mg total) by mouth daily. 90 tablet 1  . meclizine (ANTIVERT) 25 MG  tablet Take 1 tablet (25 mg total) by mouth 3 (three) times daily as needed for dizziness. 30 tablet 0  . metoprolol tartrate (LOPRESSOR) 50 MG tablet TAKE 1 TABLET TWICE DAILY (NEED MD APPOINTMENT) 180 tablet 3  . montelukast (SINGULAIR) 10 MG tablet Take 1 tablet (10 mg total) by mouth daily. 90 tablet 1  . morphine (MSIR) 15 MG tablet morphine 15 mg immediate release tablet  Take 1 tablet 3 times a day by oral route as needed.    [name] morphine (MSIR) 15 MG tablet Take 15 mg by mouth 3 (three) times daily as needed.    . nicotine polacrilex (COMMIT) 4 MG lozenge Take 1 lozenge (4 mg total) by mouth as needed for smoking cessation. 100 tablet 0  . nystatin (MYCOSTATIN/NYSTOP) powder APPLY TO AFFECTED AREA 4 TIMES DAILY. 90 g 1  . Omega-3 Fatty Acids (FISH OIL PO) Take 1 capsule by mouth daily.    [name] oxyCODONE (OXY IR/ROXICODONE) 5 MG immediate release tablet Take 5 mg by mouth every 6 (six) hours as needed.    [name] Plecanatide (TRULANCE) 3 MG TABS Take 3 mg by mouth daily. 30 tablet 3  . pregabalin (LYRICA) 75 MG capsule Take 1 capsule (75 mg total) by mouth daily.    . RABEprazole (ACIPHEX) 20 MG tablet TAKE 1 TABLET TWICE DAILY (MUST KEEP 07/2020 APPT FOR FURTHER REFILLS) 180 tablet 0  . risperiDONE (RISPERDAL) 0.5 MG tablet Take 1 tablet (0.5 mg total) by mouth at bedtime. 90 tablet 2  . rosuvastatin (CRESTOR) 5 MG tablet TAKE 1 TABLET AT BEDTIME 90 tablet 2  . sertraline (ZOLOFT) 100 MG tablet Take 1 tablet (100 mg total) by mouth daily. 90 tablet 2  . STIOLTO RESPIMAT 2.5-2.5 MCG/ACT AERS INHALE 2 PUFFS INTO THE LUNGS DAILY. 12 g 1  . TOUJEO MAX SOLOSTAR 300 UNIT/ML Solostar Pen INJECT 24 UNITS SUBCUTANEOUSLY INTO THE SKIN AT BEDTIME 12 mL 3  . traZODone (DESYREL) 100 MG tablet Take 1 tablet (100 mg total) by mouth at bedtime. 90 tablet 2  . triamcinolone (KENALOG) 0.1 % Apply 1 application topically daily. 15 g 1  . UNABLE TO [facility] bed mattress x 1  DX: G47.33, J44.9 1 each 0  . UNABLE  TO FIND Standard wheelchair Dx M47.16, R29.898 1 each 0   No current facility-administered medications on file prior to visit.      Allergies  Allergen Reactions  . Cephalexin Hives  . Iron Nausea And Vomiting  . Milk-Related Compounds Other (See Comments)    Doesn't agree with stomach.   . Penicillins Hives       .  Phenazopyridine Hcl Hives         Family History  Problem Relation Age of Onset  . Heart attack Mother        HTN  . Pneumonia Father   . Kidney failure Father   . Diabetes Father   . Pancreatic cancer Sister   . Diabetes Brother   . Hypertension Brother   . Diabetes Brother   . Cancer Sister        breast   . Hypertension Son   . Sleep apnea Son   . Cancer Sister        pancreatic  . Stroke Maternal Grandmother   . Heart attack Maternal Grandfather   . Alcohol abuse Maternal Uncle   . Colon cancer Neg Hx   . Anesthesia problems Neg Hx   . Hypotension Neg Hx   . Malignant hyperthermia Neg Hx   . Pseudochol deficiency Neg Hx   . Breast cancer Neg Hx    PE: There were no vitals taken for this visit. There is no height or weight on file to calculate BMI.  Wt Readings from Last 3 Encounters:  07/07/20 170 lb (77.1 kg)  07/06/20 171 lb (77.6 kg)  06/14/20 170 lb 9.6 oz (77.4 kg)   Constitutional: overweight, in NAD Eyes: PERRLA, EOMI, no exophthalmos ENT: moist mucous membranes, no thyromegaly, no cervical lymphadenopathy Cardiovascular: RRR, No MRG Respiratory: CTA B Gastrointestinal: abdomen soft, NT, ND, BS+ Musculoskeletal: no deformities, strength intact in all 4 Skin: moist, warm, no rashes Neurological: no tremor with outstretched hands, DTR normal in all 4  ASSESSMENT: 1. DM2, insulin-dependent, uncontrolled, without complications - gastroparesis - cerebrovasc. Ds -  H/o stroke - PN  She was interested in an insulin pump >> discussed about [employer] (given brochure).  2. Hypothyroidism  3. MNG - Thyroid U/S (10/11/2012):  Right  thyroid lobe: 3.7 x 1.0 x 1.8 cm  Left thyroid lobe: 3.5 x 1.5 x 1.6 cm  Isthmus: 0.5 cm  Focal nodules: There is a 0.3 mm calcified nodule in the right midzone. There is a 0.6 x 0.7 x 0.5 cm hypoechoic nodule in the lateral aspect of the right midzone. There is a 0.7 x 0.6 x 0.5 cm hyperechoic nodule in the lateral aspect of the right lower pole. There is a 2.2 x 1.1 x 1.7 cm complex solid nodule in the inferior aspect of the isthmus extending adjacent to the left lower pole. There is a 1.4 x 2.1 x 1.4 cm complex solid nodule in the left lower pole.  Lymphadenopathy: None visualized.  IMPRESSION: Multi nodular goiter which has markedly progressed since the prior exam. The dominant nodule at the inferior aspect of the isthmus fits criteria for fine needle aspiration biopsy if not previously Assessed.  - FNA (11/05/2012) x2: benign  - Thyroid U/S (01/20/2014) - felt one nodule enlarging >> new U/S: stable appearance of the nodules, except a small new nodule in isthmus: 1.2 cm   Right thyroid lobe Measurements: 4.4 cm x 1.2 cm x 1.7 cm. Multiple right-sided nodules identified. Each right-sided nodule demonstrates increased echogenicity, with the superior measuring 7 mm -8 mm, most inferior measuring 9 mm - 10 mm. A small, 3 mm focus of calcium with posterior shadowing is evident. There is also a mid nodule measuring 7 mm, which is echogenic.  Left thyroid lobe Measurements: 5.1 cm x 2.1 cm x 2.3 cm. Dominant lesion at the inferior pole of left thyroid is again evident, which has been previously  biopsied (10/29/2012). This nodule measures 1.8 cm x 1.7cm x 2.5 cm. (Previous 1.4 cm x 2.1 cm x 1.4 cm)  Isthmus Thickness: 4 mm-5 mm. Isthmic nodule again noted, previously biopsied (10/29/2012). Currently this nodule measures 2.2 cm x 1.4 cm x 1.8 cm. (previous measurement 2.2 cm x 1.1 cm x 1.7 cm). There is a new nodule identified within the isthmus with heterogeneously hyperechoic  characteristics. This nodule measures 12 mm x 5.4 mm x 7.2 mm.  Lymphadenopathy: None visualized.  - Thyroid U/S (11/09/2015): Details   Reading Physician Reading Date Result Priority  Sandi Mariscal, MD 11/09/2015   Narrative    COMPARISON: Thyroid ultrasound - 01/20/2014 ; 10/11/2012  FINDINGS: Parenchymal Echotexture: Moderately heterogenous Estimated total number of nodules > 1 cm: <5 Number of spongiform nodules > 2 cm not described below (TR1): 0 Number of mixed cystic nodules > 1.5 cm not described below ([place]): 0 _____________________________________________________  Isthmus: 0.7 cm  Nodule # 1: Prior biopsy: No Location: Isthmus; left Size: 1.5 x 1.3 x 1.5 cm, previously 2.2 x 1.4 x 1.8 cm Composition: solid/almost completely solid (2) Echogenicity: hyperechoic (1) ACR TI-RADS total points: 3. Change in features: Yes. Increased internal cystic degeneration and smaller in size. Change in ACR TI-RADS risk category: No  *Given size (1.5 - 2.4 cm) and appearance, a follow-up ultrasound in 1 year is recommended based on TI-RADS criteria as clinically indicated.  _________________________________________________________  Right lobe: 4.6 x 1.1 x 1.9 cm, previously 4.4 x 1.2 x 1.7 cm Stable scattered subcentimeter echogenic nodules _________________________________________________________  Left lobe: 4.8 x 2.1 x 1.9 cm, previously 5.1 x 2.1 x 2.3 cm Nodule # 1: Prior biopsy: No Location: Left; Inferior Size: 2.4 x 1.7 x 1.9 cm, previously 2.5 x 1.7 x 1.7 Composition: solid/almost completely solid (2) Echogenicity: hyperechoic (1) ACR TI-RADS total points: 3. ACR TI-RADS risk category: TR3 (3 points). Change in features: No Change in ACR TI-RADS risk category: No *Given size (1.5 - 2.4 cm) and appearance, a follow-up ultrasound in 1 year is recommended based on TI-RADS criteria as clinically indicated.  IMPRESSION: TR 3 isthmic and left thyroid nodules unchanged.  *Given size (1.5 - 2.4 cm) and appearance of both thyroid nodules, a follow-up ultrasound in 1 year is recommended based on TI-RADS criteria as clinically indicated.  The above is in keeping with the ACR TI-RADS recommendations Natasha Mead Coll Radiol 2017;14:[id number].        10/12/2016: Thyroid U/S: Parenchymal Echotexture: Moderately heterogenous Isthmus: 1.1 cm Right lobe: 3.8 x 1.0 x 1.7 cm Left lobe: 4.5 x 1.7 x 2.1 cm ______________________________________________________  Estimated total number of nodules >/= 1 cm: 3 _____________________________________________  Diffusely heterogeneous multinodular thyroid gland. As seen previously, there are multiple small echogenic nodules scattered throughout the right gland which do not meet criteria for biopsy or follow-up. Head and lobular pyramidal lobe extends superiorly from the thyroid isthmus. No interval change.  The previously biopsied nodule in the inferior aspect of the isthmus measures slightly smaller today at 1.5 x 1.0 x 1.4 cm compared to 1.7 x 1.3 x 1.5 cm previously.  The previously biopsied nodule in the left inferior gland is essentially unchanged at 2.6 x 1.3 x 1.5 cm compared to 2.4 x 1.8 x 1.9 cm. No new nodule or abnormality identified.  IMPRESSION: 1. Stable multinodular goiter with a prominent lobular parental lobe extending superiorly from the thyroid isthmus. 2. The previously biopsied nodule in the inferior isthmus measures slightly smaller on today's examination.  3. The previously biopsied nodule in the left inferior gland is stable. 4. Additional scattered small and benign-appearing nodules do not meet criteria for biopsy or continued follow-up. The above is in keeping with the ACR TI-RADS recommendations - J Am Coll Radiol 2017;14:[id number].   PLAN:  1. Patient with longstanding, uncontrolled, type 2 diabetes, on basal Bolus insulin regimen, with fluctuating CBGs but higher blood sugars  whenever she is getting steroid injections.  Latest HbA1c was lower, at 5.3%, excellent.  Sugars were mostly at goal in her detailed blood sugar log with no low blood sugars except for a value of 64 in the previous 3 months.  We did not change her regimen at that time. - at this visit, sugars are fluctuating, higher than expected from her HbA1c: Most sugars are within target range but they are occasionally in the 200s especially during steroid treatments.  She had 2 low blood sugars 2 months ago, but not recently.  For now, I advised her to continue the current regimen but to increase the doses of NovoLog whenever she gets a steroid injection. -She is interested in a CGM and I gave her a list of suppliers - I advised her to:  Patient Instructions  Please continue: - Toujeo 28 units daily - Novolog 16 units for smaller meals 18 units for large meals (20-22 units after steroid injections)  Please continue levothyroxine 50 mcg daily.  Take the thyroid hormone every day, with water, at least 30 minutes before breakfast, separated by at least 4 hours from: - acid reflux medications - calcium - iron - multivitamins  The most common suppliers for the continuous glucose monitor (FreeStyle [place]) are:   [employer] [phone], extension [phone]  -[employer] [phone]  -[employer] [phone]    Please return in 4 months.  - we checked her HbA1c: 5.8% (higher, but lower than expected from log) - advised to check sugars at different times of the day - 3-4x a day, rotating check times - advised for yearly eye exams >> she is UTD - return to clinic in 4 months   2. Hypothyroidism - latest thyroid labs reviewed with pt >> normal: Lab Results  Component Value Date   TSH 0.631 12/11/2019   - she continues on LT4 50 mcg daily - pt feels good on this dose. - we discussed about taking the thyroid hormone every day,  with water, >30 minutes before breakfast, separated by >4 hours from acid reflux medications, calcium, iron, multivitamins. Pt. is taking it correctly.  3. MNG -She has a history of 2 benign thyroid nodules biopsied in 2014 -Reviewed the thyroid ultrasound report from 2014-2019 and the nodules appear stable -No neck compression symptoms and now other than mild chronic dysphagia.  She does not have esophageal compression per barium swallow study from 08/2014.  She did did have esophageal dysmotility and had multiple esophageal dilations. -No need to repeat the ultrasound for now unless she develops new neck compression symptoms -We will continue to follow her expectantly for now  Philemon Kingdom, MD PhD [facility] Endocrinology

## 2020-07-29 NOTE — Patient Instructions (Addendum)
Please continue: - Toujeo 28 units daily - Novolog 16 units for smaller meals 18 units for large meals (20-22 units after steroid injections)  Please continue levothyroxine 50 mcg daily.  Take the thyroid hormone every day, with water, at least 30 minutes before breakfast, separated by at least 4 hours from: - acid reflux medications - calcium - iron - multivitamins  The most common suppliers for the continuous glucose monitor (FreeStyle [place]) are:   [employer] [phone], extension [phone]  -[employer] [phone]  -[place] [phone]    Please return in 4 months.

## 2020-07-29 NOTE — Addendum Note (Signed)
Addended by: Lauralyn Primes on: 07/29/2020 04:29 PM   Modules accepted: Orders

## 2020-08-02 ENCOUNTER — Other Ambulatory Visit: Payer: Self-pay | Admitting: Nurse Practitioner

## 2020-08-02 DIAGNOSIS — S8261XK Displaced fracture of lateral malleolus of right fibula, subsequent encounter for closed fracture with nonunion: Secondary | ICD-10-CM | POA: Diagnosis not present

## 2020-08-03 ENCOUNTER — Other Ambulatory Visit: Payer: Self-pay | Admitting: Nurse Practitioner

## 2020-08-03 ENCOUNTER — Telehealth: Payer: Self-pay

## 2020-08-03 ENCOUNTER — Other Ambulatory Visit: Payer: Self-pay | Admitting: Family Medicine

## 2020-08-03 DIAGNOSIS — D649 Anemia, unspecified: Secondary | ICD-10-CM | POA: Diagnosis not present

## 2020-08-03 DIAGNOSIS — J449 Chronic obstructive pulmonary disease, unspecified: Secondary | ICD-10-CM | POA: Diagnosis not present

## 2020-08-03 DIAGNOSIS — J42 Unspecified chronic bronchitis: Secondary | ICD-10-CM | POA: Diagnosis not present

## 2020-08-03 DIAGNOSIS — E1142 Type 2 diabetes mellitus with diabetic polyneuropathy: Secondary | ICD-10-CM | POA: Diagnosis not present

## 2020-08-03 DIAGNOSIS — F17210 Nicotine dependence, cigarettes, uncomplicated: Secondary | ICD-10-CM | POA: Diagnosis not present

## 2020-08-03 DIAGNOSIS — E039 Hypothyroidism, unspecified: Secondary | ICD-10-CM | POA: Diagnosis not present

## 2020-08-03 DIAGNOSIS — M160 Bilateral primary osteoarthritis of hip: Secondary | ICD-10-CM | POA: Diagnosis not present

## 2020-08-03 DIAGNOSIS — F250 Schizoaffective disorder, bipolar type: Secondary | ICD-10-CM | POA: Diagnosis not present

## 2020-08-03 DIAGNOSIS — R2681 Unsteadiness on feet: Secondary | ICD-10-CM | POA: Diagnosis not present

## 2020-08-03 IMAGING — DX DG CHEST 2V
2 series · 2 of 2 positions shown · non-contrast
Comparison: 12/05/2018

CLINICAL DATA: Dyspnea on exertion, diabetes mellitus,
hypertension, COPD, smoker

EXAM:
CHEST - 2 VIEW

[chest pa]
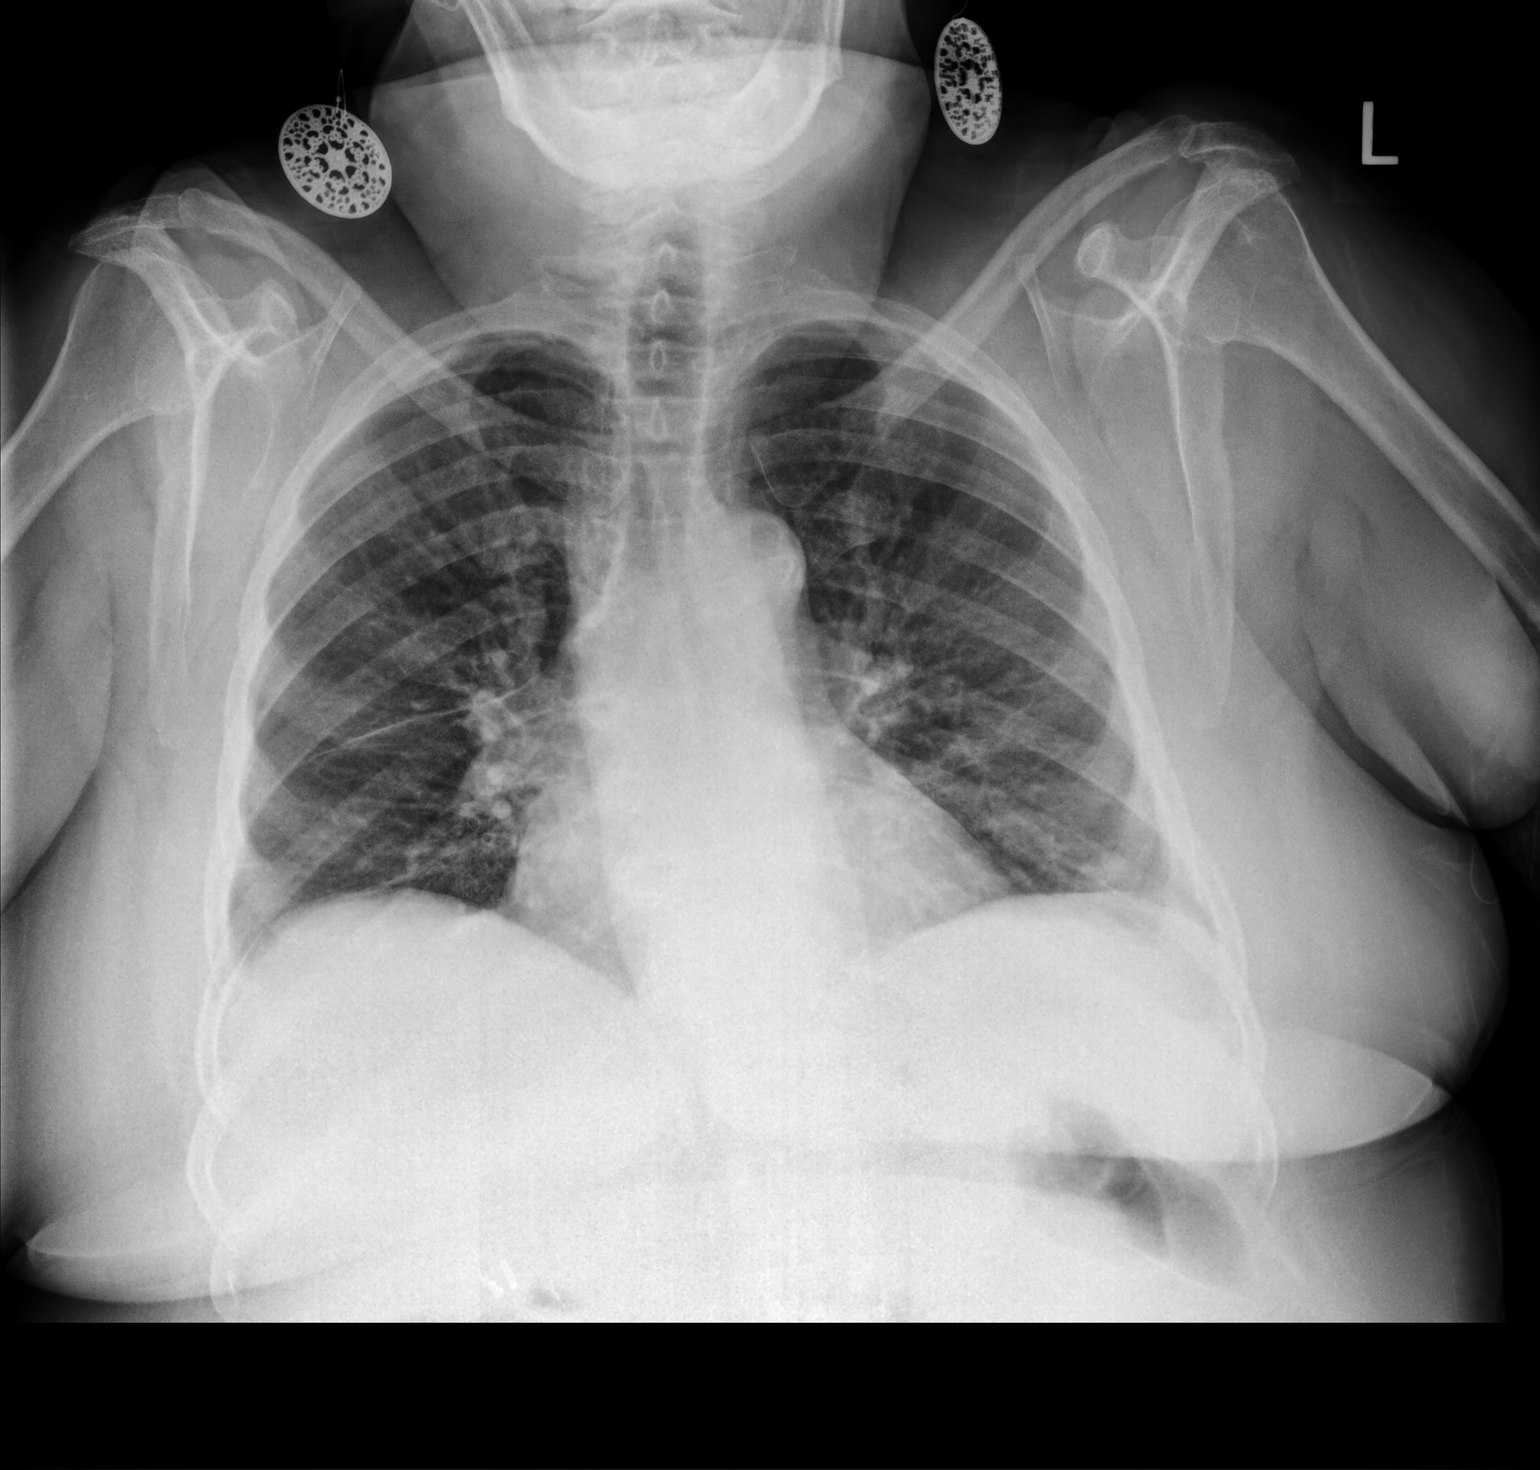

[chest lat]
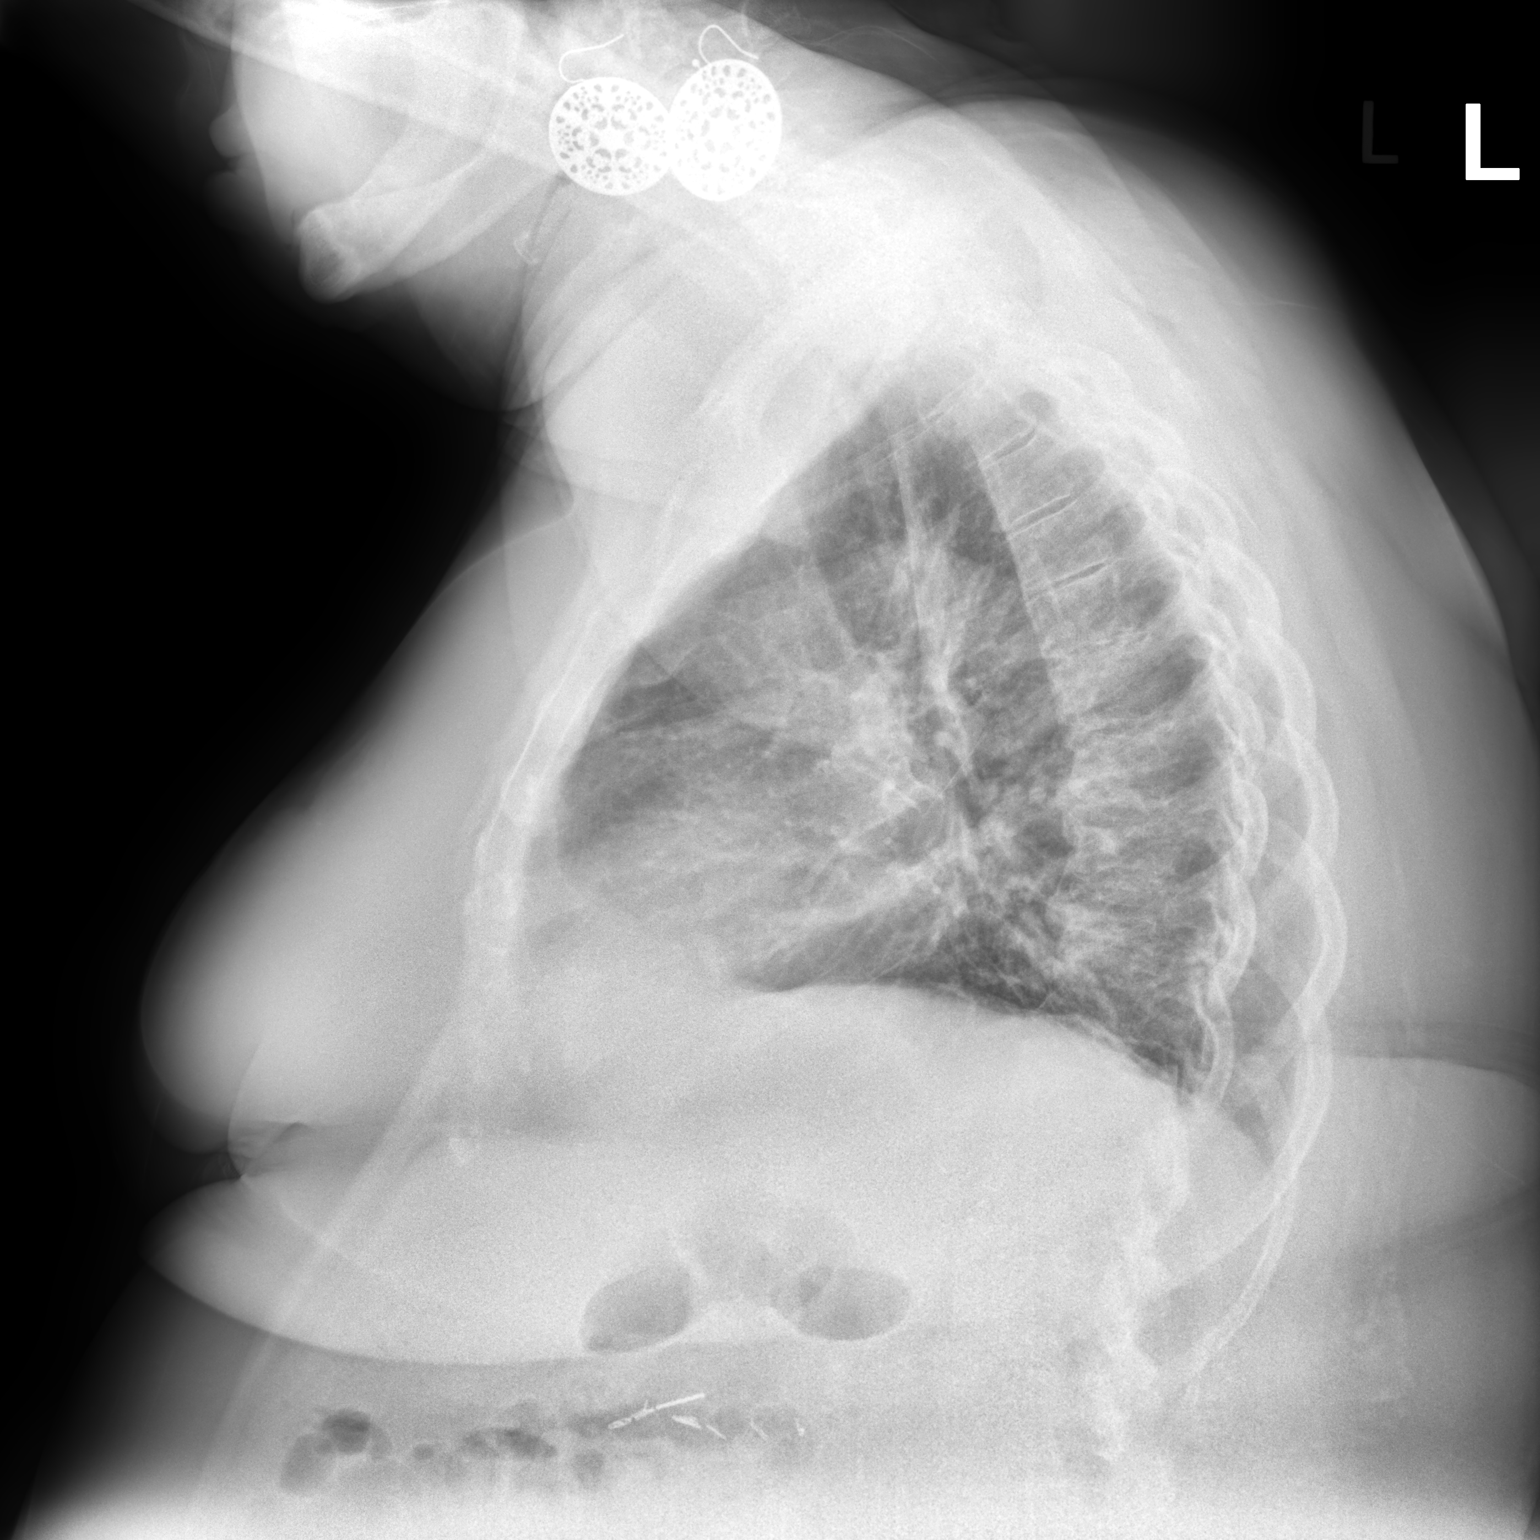

[2 of 2 positions shown; findings below may reference images not displayed]

FINDINGS: Upper normal heart size.

Atherosclerotic calcification aorta.

Slight vascular congestion.

Bronchitic changes with mild RIGHT basilar atelectasis.

Questionable nodular density LEFT base 14 x 9 mm diameter, cannot
exclude pulmonary nodule.

No acute infiltrate, pleural effusion or pneumothorax.

Degenerative disc disease changes thoracic spine with extra convex
scoliosis.
IMPRESSION: Bronchitic changes with RIGHT basilar atelectasis.

Question 14 x 9 mm nodular density LEFT lung base; CT chest
recommended to exclude pulmonary nodule.

These results will be called to the ordering clinician or
representative by the Radiologist Assistant, and communication
documented in the PACS or zVision Dashboard.

## 2020-08-03 MED ORDER — HYDROXYZINE HCL 10 MG PO TABS: 10.0000 mg | ORAL_TABLET | Freq: Three times a day (TID) | ORAL | 0 refills | Status: DC | PRN

## 2020-08-03 NOTE — Telephone Encounter (Signed)
Stacey with Therapy wants to continue with the therapy Pt did a little Backslide ,  hoping to get her back on track

## 2020-08-03 NOTE — Telephone Encounter (Signed)
Verbal ok given.

## 2020-08-03 NOTE — Telephone Encounter (Signed)
Pt requesting a refill of the hydroxyzine for itching. She said gray changed it to benadryl but the hydroxyzine works for her

## 2020-08-03 NOTE — Telephone Encounter (Signed)
P[prescribed short term hydroxyzine at [facility], use only if needed, stop benadryl, do nOT use both

## 2020-08-03 NOTE — Telephone Encounter (Signed)
Called [facility] and ask that they deliver

## 2020-08-03 NOTE — Telephone Encounter (Signed)
Pt needs itch medication called in .  Pt is also questioning the CPAP machine.  Having a time controlling her Urine also.[name]  Please call [place]

## 2020-08-03 NOTE — Telephone Encounter (Signed)
States she never got the benadryl

## 2020-08-04 ENCOUNTER — Other Ambulatory Visit: Payer: Self-pay

## 2020-08-04 ENCOUNTER — Encounter: Payer: Self-pay | Admitting: Podiatry

## 2020-08-04 ENCOUNTER — Ambulatory Visit (INDEPENDENT_AMBULATORY_CARE_PROVIDER_SITE_OTHER): Payer: Medicare HMO | Admitting: Podiatry

## 2020-08-04 DIAGNOSIS — B351 Tinea unguium: Secondary | ICD-10-CM | POA: Diagnosis not present

## 2020-08-04 DIAGNOSIS — M79676 Pain in unspecified toe(s): Secondary | ICD-10-CM

## 2020-08-04 DIAGNOSIS — E1142 Type 2 diabetes mellitus with diabetic polyneuropathy: Secondary | ICD-10-CM

## 2020-08-05 ENCOUNTER — Telehealth: Payer: Self-pay | Admitting: Internal Medicine

## 2020-08-05 DIAGNOSIS — E039 Hypothyroidism, unspecified: Secondary | ICD-10-CM

## 2020-08-05 DIAGNOSIS — J449 Chronic obstructive pulmonary disease, unspecified: Secondary | ICD-10-CM | POA: Diagnosis not present

## 2020-08-05 DIAGNOSIS — R2681 Unsteadiness on feet: Secondary | ICD-10-CM | POA: Diagnosis not present

## 2020-08-05 DIAGNOSIS — M160 Bilateral primary osteoarthritis of hip: Secondary | ICD-10-CM | POA: Diagnosis not present

## 2020-08-05 DIAGNOSIS — F17210 Nicotine dependence, cigarettes, uncomplicated: Secondary | ICD-10-CM | POA: Diagnosis not present

## 2020-08-05 DIAGNOSIS — E1142 Type 2 diabetes mellitus with diabetic polyneuropathy: Secondary | ICD-10-CM | POA: Diagnosis not present

## 2020-08-05 DIAGNOSIS — D649 Anemia, unspecified: Secondary | ICD-10-CM | POA: Diagnosis not present

## 2020-08-05 DIAGNOSIS — J42 Unspecified chronic bronchitis: Secondary | ICD-10-CM | POA: Diagnosis not present

## 2020-08-05 DIAGNOSIS — F250 Schizoaffective disorder, bipolar type: Secondary | ICD-10-CM | POA: Diagnosis not present

## 2020-08-05 NOTE — Telephone Encounter (Signed)
[name], RN  from [facility] needs clarfication for levothyroxine (SYNTHROID) 50 MCG tablet and the dose

## 2020-08-07 DIAGNOSIS — E1142 Type 2 diabetes mellitus with diabetic polyneuropathy: Secondary | ICD-10-CM | POA: Diagnosis not present

## 2020-08-07 DIAGNOSIS — J449 Chronic obstructive pulmonary disease, unspecified: Secondary | ICD-10-CM | POA: Diagnosis not present

## 2020-08-07 DIAGNOSIS — F250 Schizoaffective disorder, bipolar type: Secondary | ICD-10-CM | POA: Diagnosis not present

## 2020-08-07 DIAGNOSIS — J42 Unspecified chronic bronchitis: Secondary | ICD-10-CM | POA: Diagnosis not present

## 2020-08-07 DIAGNOSIS — K5730 Diverticulosis of large intestine without perforation or abscess without bleeding: Secondary | ICD-10-CM

## 2020-08-07 DIAGNOSIS — R296 Repeated falls: Secondary | ICD-10-CM

## 2020-08-07 DIAGNOSIS — D649 Anemia, unspecified: Secondary | ICD-10-CM | POA: Diagnosis not present

## 2020-08-07 DIAGNOSIS — R2681 Unsteadiness on feet: Secondary | ICD-10-CM | POA: Diagnosis not present

## 2020-08-07 DIAGNOSIS — M160 Bilateral primary osteoarthritis of hip: Secondary | ICD-10-CM | POA: Diagnosis not present

## 2020-08-07 DIAGNOSIS — F17210 Nicotine dependence, cigarettes, uncomplicated: Secondary | ICD-10-CM | POA: Diagnosis not present

## 2020-08-07 DIAGNOSIS — K589 Irritable bowel syndrome without diarrhea: Secondary | ICD-10-CM

## 2020-08-07 DIAGNOSIS — E039 Hypothyroidism, unspecified: Secondary | ICD-10-CM | POA: Diagnosis not present

## 2020-08-07 DIAGNOSIS — I10 Essential (primary) hypertension: Secondary | ICD-10-CM

## 2020-08-08 NOTE — Progress Notes (Signed)
Subjective:  Patient ID: [name], female    DOB: 09-09-1950,  MRN: [id number]  [name] presents to clinic today for at risk foot care with history of diabetic neuropathy and painful thick toenails that are difficult to trim. Pain interferes with ambulation. Aggravating factors include wearing enclosed shoe gear. Pain is relieved with periodic professional debridement.  Patient is s/p right ankle fracture which is being managed by her Ortho MD. She states she saw him on yesterday and was allowed to remove her aircast. She states she is using a bone stimulator. She does relate swelling to ankle.   PCP is Dr. Tula Nakayama and last visit was 07/06/2020.  Allergies  Allergen Reactions  . Lac Bovis Rash    Doesn't agree with stomach.   . Phenazopyridine Hcl Hives  . Cephalexin Hives  . Flonase [Fluticasone]     "It gave me ulcers in my nose"  . Iron Nausea And Vomiting  . Milk-Related Compounds Other (See Comments)    Doesn't agree with stomach.   . Penicillins Hives    Has patient had a PCN reaction causing immediate rash, facial/tongue/throat swelling, SOB or lightheadedness with hypotension: Yes Has patient had a PCN reaction causing severe rash involving mucus membranes or skin necrosis: No Has patient had a PCN reaction that required hospitalization No Has patient had a PCN reaction occurring within the last 10 years: No If all of the above answers are "NO", then may proceed with Cephalosporin use.   [name] Phenazopyridine Hcl Hives         Review of Systems: Negative except as noted in the HPI. Objective:   Constitutional [name] is a pleasant 70 y.o. African American female, in NAD. AAO x 3.   Vascular Capillary refill time to digits immediate b/l. Faintly palpable pedal pulses b/l. Pedal hair sparse. Lower extremity skin temperature gradient within normal limits. No pain with calf compression b/l. No cyanosis or clubbing noted.  Neurologic Normal speech. Oriented  to person, place, and time. Protective sensation diminished with 10g monofilament b/l.  Dermatologic Pedal skin with normal turgor, texture and tone bilaterally. No open wounds bilaterally. No interdigital macerations bilaterally. Toenails 1-5 b/l elongated, discolored, dystrophic, thickened, crumbly with subungual debris and tenderness to dorsal palpation.  Orthopedic: Normal muscle strength 5/5 to all lower extremity muscle groups bilaterally. No pain crepitus or joint limitation noted with ROM b/l. Hallux valgus with bunion deformity noted b/l lower extremities.   Right ankle with trace edema noted on medial and lateral aspects of RLE. No erythema, no warmth, no flucutance.   Radiographs: None Assessment:   1. Pain due to onychomycosis of toenail   2. Diabetic polyneuropathy associated with type 2 diabetes mellitus ([place])    Plan:  Patient was evaluated and treated and all questions answered.  Onychomycosis with pain -Nails palliatively debridement as below -Educated on self-care  Procedure: Nail Debridement Rationale: Pain Type of Debridement: manual, sharp debridement. Instrumentation: Nail nipper, rotary burr. Number of Nails: 10 -Examined patient. -Continue diabetic foot care principles. -Patient to continue soft, supportive shoe gear daily. -Toenails 1-5 b/l were debrided in length and girth with sterile nail nippers and dremel without iatrogenic bleeding.  -Patient to report any pedal injuries to medical professional immediately. -Advised Ms. Feldner to contact her Orthopedic physician for furhter instructions regarding her right ankle. She related understanding. -Patient/POA to call should there be question/concern in the interim.  Return in about 3 months (around 11/04/2020).  Marzetta Board,  DPM

## 2020-08-09 MED ORDER — LEVOTHYROXINE SODIUM 50 MCG PO TABS: ORAL_TABLET | ORAL | 3 refills | Status: DC

## 2020-08-09 NOTE — Telephone Encounter (Signed)
Rx updated and resent to [facility]

## 2020-08-10 ENCOUNTER — Encounter: Payer: Self-pay | Admitting: Internal Medicine

## 2020-08-10 ENCOUNTER — Ambulatory Visit (INDEPENDENT_AMBULATORY_CARE_PROVIDER_SITE_OTHER): Payer: Medicare HMO | Admitting: Internal Medicine

## 2020-08-10 VITALS — BP 160/66 | HR 60 | Ht 59.75 in | Wt 169.0 lb

## 2020-08-10 DIAGNOSIS — D649 Anemia, unspecified: Secondary | ICD-10-CM | POA: Diagnosis not present

## 2020-08-10 DIAGNOSIS — Z860100 Personal history of colon polyps, unspecified: Secondary | ICD-10-CM

## 2020-08-10 DIAGNOSIS — M160 Bilateral primary osteoarthritis of hip: Secondary | ICD-10-CM | POA: Diagnosis not present

## 2020-08-10 DIAGNOSIS — E039 Hypothyroidism, unspecified: Secondary | ICD-10-CM | POA: Diagnosis not present

## 2020-08-10 DIAGNOSIS — K5909 Other constipation: Secondary | ICD-10-CM

## 2020-08-10 DIAGNOSIS — F250 Schizoaffective disorder, bipolar type: Secondary | ICD-10-CM | POA: Diagnosis not present

## 2020-08-10 DIAGNOSIS — F17210 Nicotine dependence, cigarettes, uncomplicated: Secondary | ICD-10-CM | POA: Diagnosis not present

## 2020-08-10 DIAGNOSIS — R140 Abdominal distension (gaseous): Secondary | ICD-10-CM

## 2020-08-10 DIAGNOSIS — J449 Chronic obstructive pulmonary disease, unspecified: Secondary | ICD-10-CM | POA: Diagnosis not present

## 2020-08-10 DIAGNOSIS — E1142 Type 2 diabetes mellitus with diabetic polyneuropathy: Secondary | ICD-10-CM | POA: Diagnosis not present

## 2020-08-10 DIAGNOSIS — R2681 Unsteadiness on feet: Secondary | ICD-10-CM | POA: Diagnosis not present

## 2020-08-10 DIAGNOSIS — J42 Unspecified chronic bronchitis: Secondary | ICD-10-CM | POA: Diagnosis not present

## 2020-08-10 DIAGNOSIS — R110 Nausea: Secondary | ICD-10-CM | POA: Diagnosis not present

## 2020-08-10 DIAGNOSIS — M6289 Other specified disorders of muscle: Secondary | ICD-10-CM

## 2020-08-10 IMAGING — CT CT CHEST W/O CM
2 of 3 series · 15 of 36 positions shown, 18 images · non-contrast
Comparison: Chest x-ray 01/09/2019.  Chest CT 01/23/2018

CLINICAL DATA: Pulmonary nodule.

EXAM:
CT CHEST WITHOUT CONTRAST
TECHNIQUE: Multidetector CT imaging of the chest was performed following the
standard protocol without IV contrast.

[Series 3: chest w/o 2mm st · axial · non-contrast · 0.87mm/px · z∈[+1034,+1284]mm · 12 of 147 slices shown, 15 images]
[im 11/147  mediastinal]
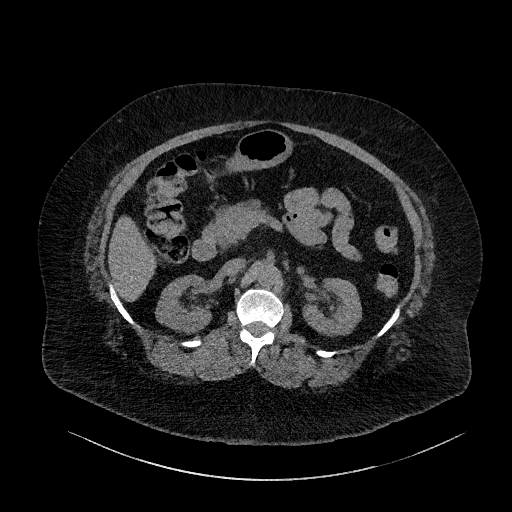
[im 11/147  lung]
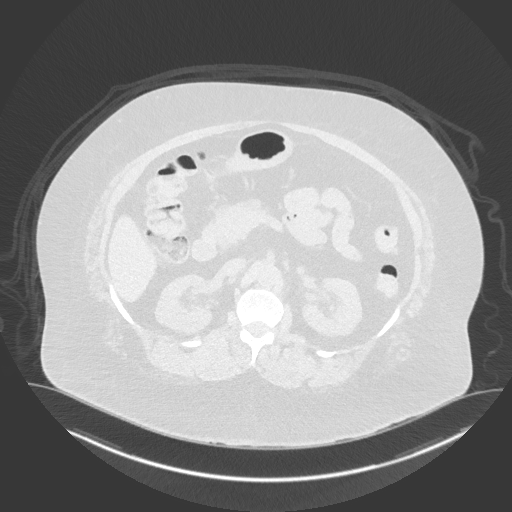
[im 22/147  lung]
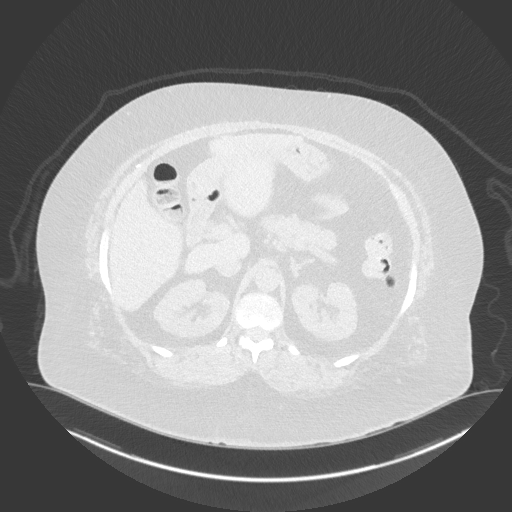
[im 33/147  lung]
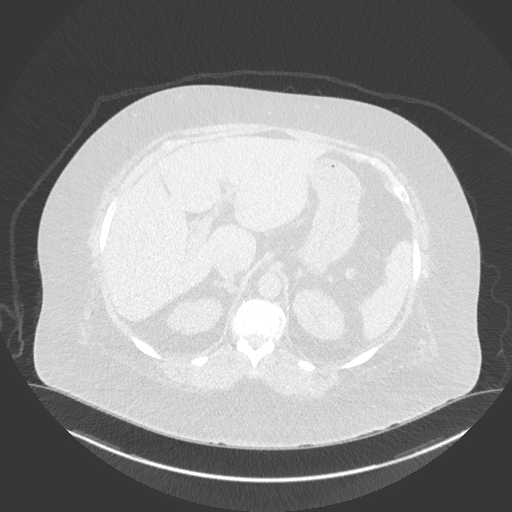
[im 44/147  lung]
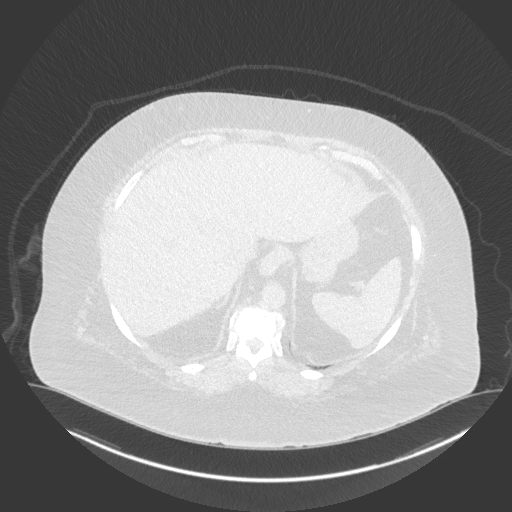
[im 55/147  mediastinal]
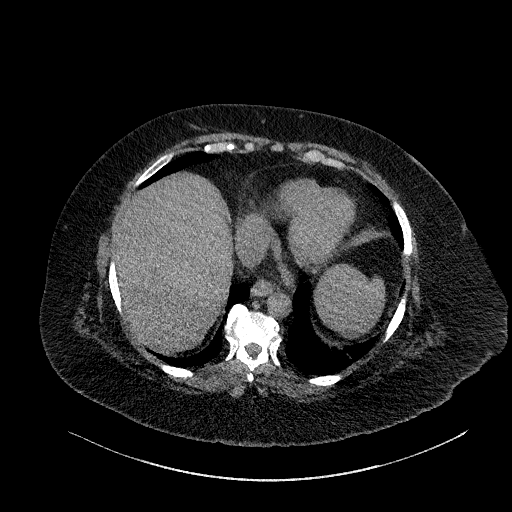
[im 55/147  lung]
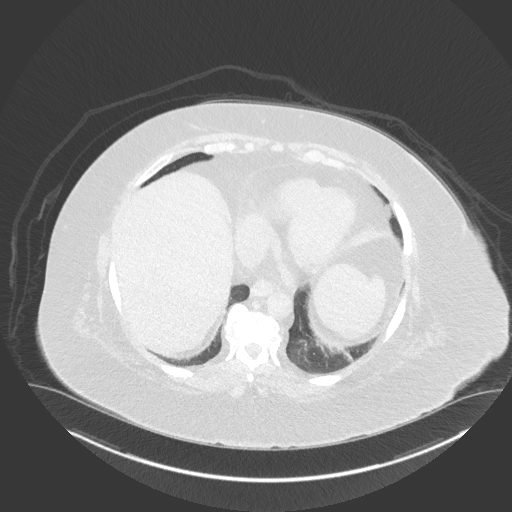
[im 65/147  lung]
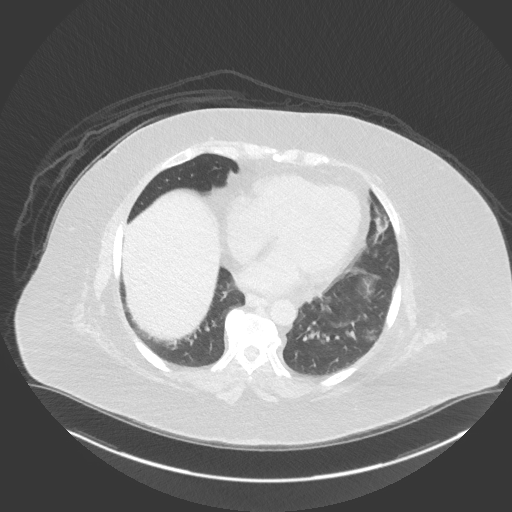
[im 82/147  lung]
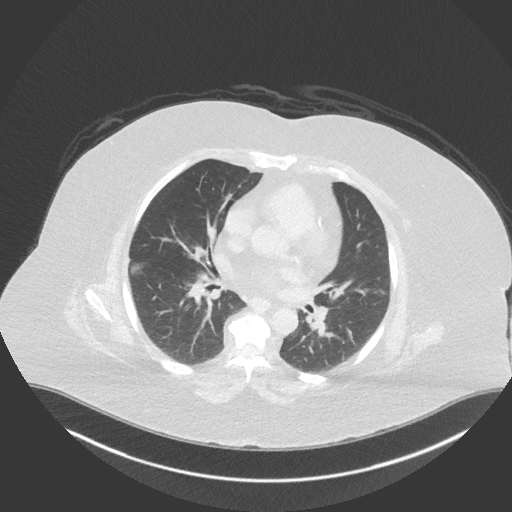
[im 92/147  lung]
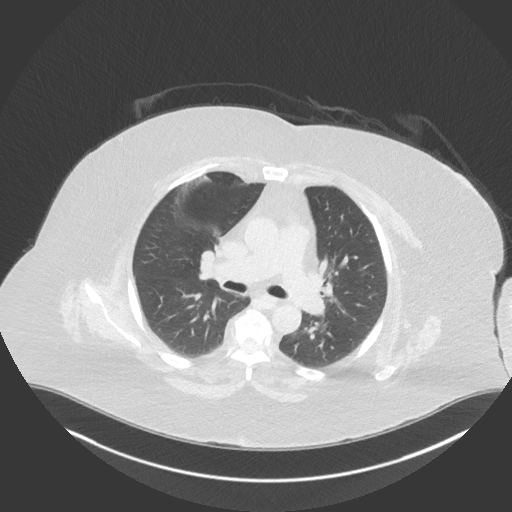
[im 103/147  mediastinal]
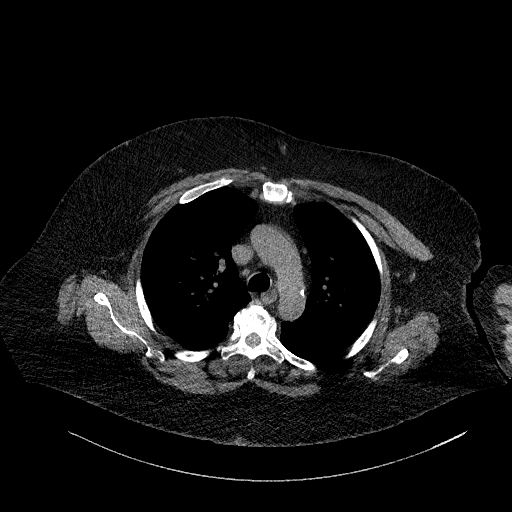
[im 103/147  lung]
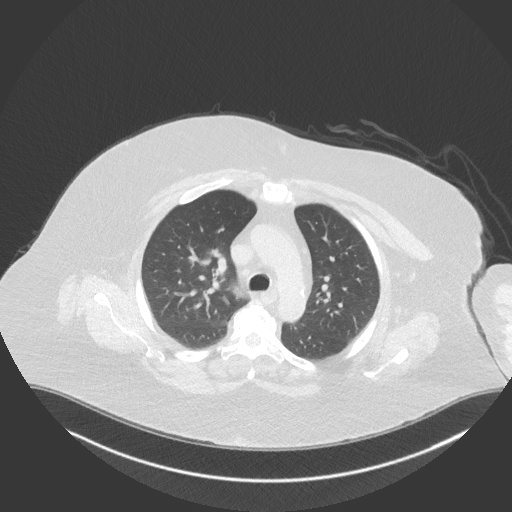
[im 114/147  lung]
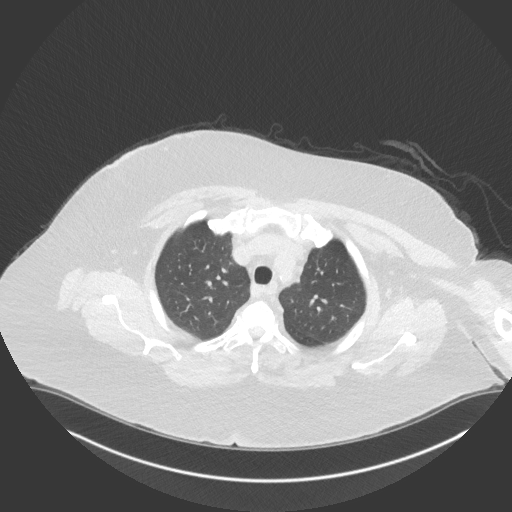
[im 125/147  lung]
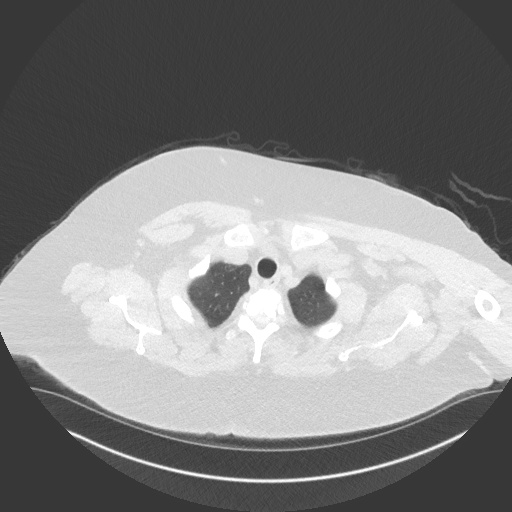
[im 136/147  lung]
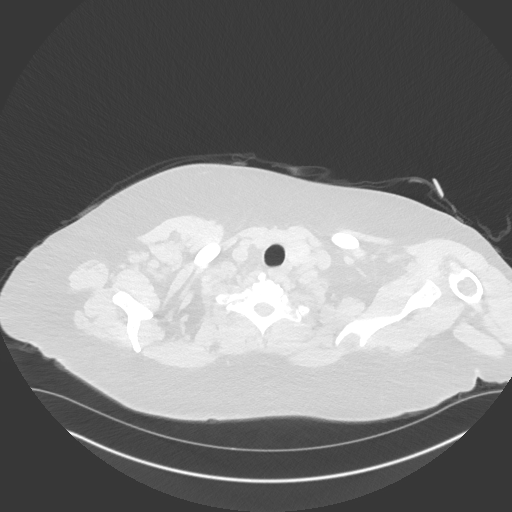

[Series 5: chest w/o 3mm st cor · coronal · non-contrast · 0.59mm/px · 3 of 101 slices shown]
[im 21/101  lung]
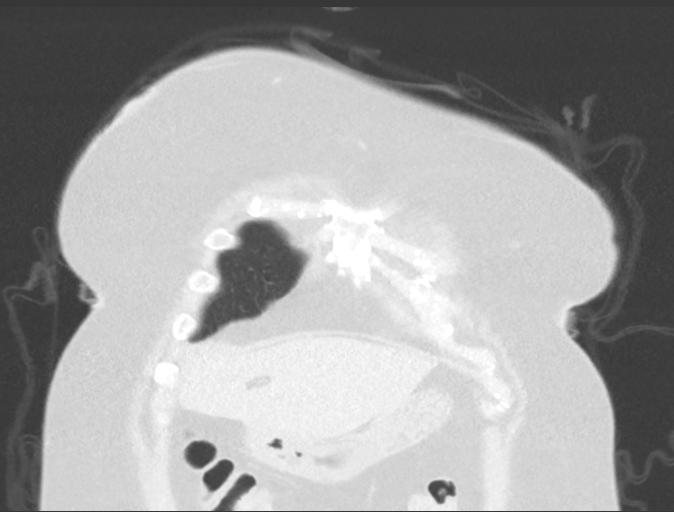
[im 41/101  lung]
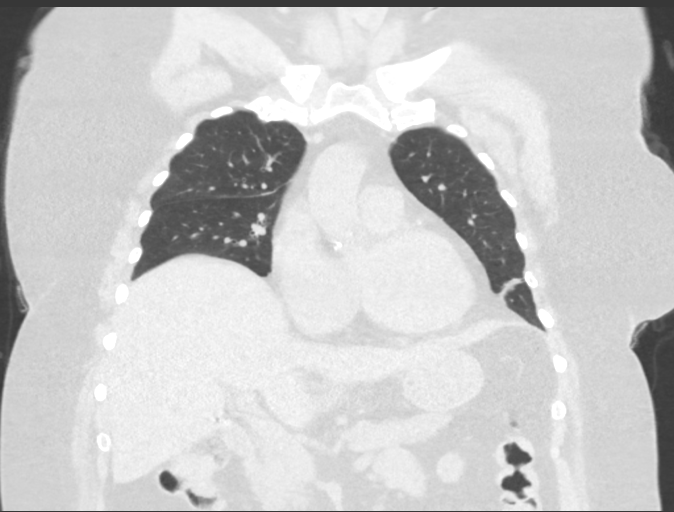
[im 61/101  lung]
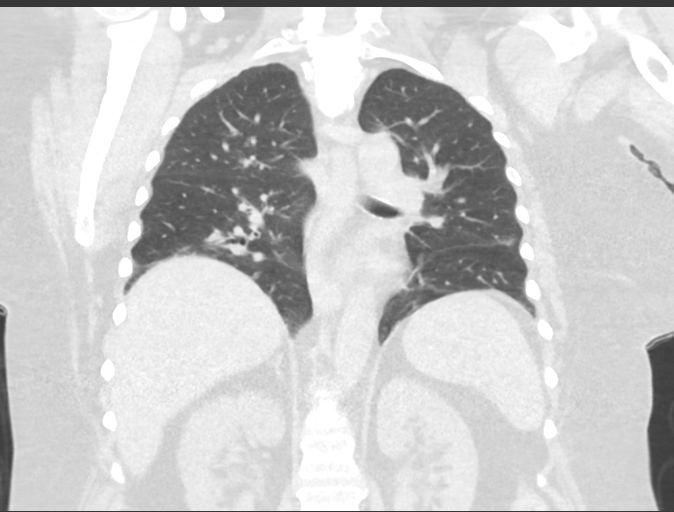

[15 of 36 positions shown; findings below may reference images not displayed]

FINDINGS: Cardiovascular: The heart size is normal. No substantial pericardial
effusion. Coronary artery calcification is evident. Atherosclerotic
calcification is noted in the wall of the thoracic aorta.

Mediastinum/Nodes: No mediastinal lymphadenopathy. No evidence for
gross hilar lymphadenopathy although assessment is limited by the
lack of intravenous contrast on today's study. The esophagus has
normal imaging features. There is no axillary lymphadenopathy.

Lungs/Pleura: No suspicious pulmonary nodule or mass. Scattered
areas of platelike atelectasis are noted in the lower lungs
bilaterally. Patient does have a focus of chronic atelectasis or
scar in the lingula with a somewhat nodular configuration
inferiorly. This is stable comparing to the prior chest CT and has
been present back to an abdominal/pelvic CT of 10/10/2016. This
likely accounts for the nodular opacity seen on recent chest x-ray.
There is no pulmonary edema or pleural effusion on today's study.

Upper Abdomen: Cholecystectomy.  Otherwise unremarkable.

Musculoskeletal: No worrisome lytic or sclerotic osseous
abnormality.
IMPRESSION: 1. No suspicious pulmonary nodule or mass on today's study. There is
some focal chronic atelectasis or scarring in the lingula, not
substantially changed since Saturday September, 2016. This likely accounts for
the nodular opacity seen on the recent chest x-ray.
2.  Aortic Atherosclerois ([id number])

## 2020-08-10 MED ORDER — TRULANCE 3 MG PO TABS: 3.0000 mg | ORAL_TABLET | Freq: Every day | ORAL | 0 refills | Status: DC | PRN

## 2020-08-10 MED ORDER — SUTAB 1479-225-188 MG PO TABS: ORAL_TABLET | ORAL | 0 refills | Status: DC

## 2020-08-10 NOTE — Progress Notes (Signed)
Subjective:    Patient ID: [name], female    DOB: 04/15/1950, 70 y.o.   MRN: [id number]  HPI [name] is a 70 year old female with a history of chronic constipation, pelvic floor dysfunction with history of rectocele and cystocele, colonic diverticulosis, history of adenomatous polyps, GERD, prior small bowel obstruction, history of cholecystectomy and hysterectomy, hypertension, diabetes and sleep apnea who is here for follow-up.  She is here alone today and I last saw her in mid November 2021.  She reports that she has tried Trulance but notes that she cannot use it every day because it causes consistent diarrhea.  She has not been using Trulance daily and has alternating constipation and loose stools.  When she is constipated she has significant abdominal distention and crampy discomfort.  She believes that her supplemental calcium as well as the oxycodone which she takes intermittently for her back, cervical disc disease and joint pain worsens her constipation.  She tries to not take oxycodone daily.  She has not seen blood in her stool or melena.  She tried Electronics engineer but could not tell much benefit and she switched back to her previous probiotic which she feels works better for her called advanced biotic.  She has frequent nausea and this can last several days without vomiting.  This is worse after eating.  She has been taking her Aciphex.  No dysphagia symptom.   Review of Systems As per HPI, otherwise negative  Current Medications, Allergies, Past Medical History, Past Surgical History, Family History and Social History were reviewed in [employer] record.     Objective:   Physical Exam BP (!) 160/66 (BP Location: Right Arm, Patient Position: Sitting, Cuff Size: Normal)   Pulse 60   Ht 4' 11.75" (1.518 m) Comment: height measured without shoes  Wt 169 lb (76.7 kg)   BMI 33.28 kg/m  Gen: awake, alert, NAD HEENT: anicteric  CV: RRR, no mrg Pulm: CTA  b/l Abd: soft, obese, NT/ND, +BS throughout Ext: no c/c/e Neuro: nonfocal     Assessment & Plan:  70 year old female with a history of chronic constipation, pelvic floor dysfunction with history of rectocele and cystocele, colonic diverticulosis, history of adenomatous polyps, GERD, prior small bowel obstruction, history of cholecystectomy and hysterectomy, hypertension, diabetes and sleep apnea who is here for follow-up.   1.  Chronic constipation/abdominal bloating/pelvic floor dysfunction --it is apparent that her pelvic floor dysfunction and rectocele are significantly contributing to her defecation dysfunction as well as her alternating bowel habits.  She has previously tried Amitiza and Linzess which caused urgency and accidents.  Trulance seems to work better than those 2 but still causes loose stool when taken every day.  We discussed her symptoms at length and I recommended the following: -- Discontinue daily Trulance; this could still be used 3 mg once daily for significant constipation but more on an as-needed basis given her response -- Begin Citrucel 1 tablespoon once to twice daily; hopefully the higher fiber will help prevent the hard stool but also loose stools. -- Oxycodone is exacerbating her constipation but needed, if this becomes more of a daily medication we could try Movantik  2.  Nausea --more of a predominant symptom of late despite Aciphex 20 mg twice daily.  We will repeat upper endoscopy --EGD; we discussed the risk, benefits and alternatives and she is agreeable and wishes to proceed  3.  History of adenomatous colon polyps --we discussed surveillance colonoscopy this year.  We  reviewed the risk, benefits and alternatives and she is agreeable and wishes to proceed --Colonoscopy  4.  GERD --continuing Aciphex 20 mg twice daily  30 minutes total spent today including patient facing time, coordination of care, reviewing medical history/procedures/pertinent radiology  studies, and documentation of the encounter.

## 2020-08-10 NOTE — Patient Instructions (Signed)
You have been scheduled for an endoscopy and colonoscopy. Please follow the written instructions given to you at your visit today. Please pick up your prep supplies at the pharmacy within the next 1-3 days. If you use inhalers (even only as needed), please bring them with you on the day of your procedure.  Please purchase the following medications over the counter and take as directed: Citrucel powder-twice daily   Continue Aciphex.  Use Trulance as needed (change from previous dosing).  If you are age 14 or older, your body mass index should be between 23-30. Your Body mass index is 33.28 kg/m. If this is out of the aforementioned range listed, please consider follow up with your Primary Care Provider. __________________________________________________________  The [facility] providers would like to encourage you to use [facility] to communicate with providers for non-urgent requests or questions.  Due to long hold times on the telephone, sending your provider a message by [facility] may be a faster and more efficient way to get a response.  Please allow 48 business hours for a response.  Please remember that this is for non-urgent requests.   Due to recent changes in healthcare laws, you may see the results of your imaging and laboratory studies on MyChart before your provider has had a chance to review them.  We understand that in some cases there may be results that are confusing or concerning to you. Not all laboratory results come back in the same time frame and the provider may be waiting for multiple results in order to interpret others.  Please give [name] 48 hours in order for your provider to thoroughly review all the results before contacting the office for clarification of your results.

## 2020-08-11 ENCOUNTER — Other Ambulatory Visit: Payer: Self-pay

## 2020-08-11 ENCOUNTER — Ambulatory Visit (HOSPITAL_COMMUNITY): Payer: Medicare HMO | Admitting: Psychiatry

## 2020-08-11 DIAGNOSIS — J449 Chronic obstructive pulmonary disease, unspecified: Secondary | ICD-10-CM | POA: Diagnosis not present

## 2020-08-11 DIAGNOSIS — I10 Essential (primary) hypertension: Secondary | ICD-10-CM | POA: Diagnosis not present

## 2020-08-12 DIAGNOSIS — F17210 Nicotine dependence, cigarettes, uncomplicated: Secondary | ICD-10-CM | POA: Diagnosis not present

## 2020-08-12 DIAGNOSIS — I10 Essential (primary) hypertension: Secondary | ICD-10-CM | POA: Diagnosis not present

## 2020-08-12 DIAGNOSIS — J449 Chronic obstructive pulmonary disease, unspecified: Secondary | ICD-10-CM | POA: Diagnosis not present

## 2020-08-12 DIAGNOSIS — M160 Bilateral primary osteoarthritis of hip: Secondary | ICD-10-CM | POA: Diagnosis not present

## 2020-08-12 DIAGNOSIS — E1142 Type 2 diabetes mellitus with diabetic polyneuropathy: Secondary | ICD-10-CM | POA: Diagnosis not present

## 2020-08-12 DIAGNOSIS — F250 Schizoaffective disorder, bipolar type: Secondary | ICD-10-CM | POA: Diagnosis not present

## 2020-08-12 DIAGNOSIS — E039 Hypothyroidism, unspecified: Secondary | ICD-10-CM | POA: Diagnosis not present

## 2020-08-12 DIAGNOSIS — D649 Anemia, unspecified: Secondary | ICD-10-CM | POA: Diagnosis not present

## 2020-08-12 DIAGNOSIS — J42 Unspecified chronic bronchitis: Secondary | ICD-10-CM | POA: Diagnosis not present

## 2020-08-12 DIAGNOSIS — R2681 Unsteadiness on feet: Secondary | ICD-10-CM | POA: Diagnosis not present

## 2020-08-13 ENCOUNTER — Telehealth: Payer: Self-pay

## 2020-08-13 DIAGNOSIS — J449 Chronic obstructive pulmonary disease, unspecified: Secondary | ICD-10-CM | POA: Diagnosis not present

## 2020-08-13 DIAGNOSIS — F17210 Nicotine dependence, cigarettes, uncomplicated: Secondary | ICD-10-CM | POA: Diagnosis not present

## 2020-08-13 DIAGNOSIS — F250 Schizoaffective disorder, bipolar type: Secondary | ICD-10-CM | POA: Diagnosis not present

## 2020-08-13 DIAGNOSIS — E1142 Type 2 diabetes mellitus with diabetic polyneuropathy: Secondary | ICD-10-CM | POA: Diagnosis not present

## 2020-08-13 DIAGNOSIS — E039 Hypothyroidism, unspecified: Secondary | ICD-10-CM | POA: Diagnosis not present

## 2020-08-13 DIAGNOSIS — J42 Unspecified chronic bronchitis: Secondary | ICD-10-CM | POA: Diagnosis not present

## 2020-08-13 DIAGNOSIS — M160 Bilateral primary osteoarthritis of hip: Secondary | ICD-10-CM | POA: Diagnosis not present

## 2020-08-13 DIAGNOSIS — R2681 Unsteadiness on feet: Secondary | ICD-10-CM | POA: Diagnosis not present

## 2020-08-13 DIAGNOSIS — D649 Anemia, unspecified: Secondary | ICD-10-CM | POA: Diagnosis not present

## 2020-08-13 NOTE — Telephone Encounter (Signed)
Verbal order given  

## 2020-08-13 NOTE — Telephone Encounter (Signed)
Will from Occupational Therapist called needs a verbal order for OT.  Has completed the evaluation as ordered, request OT 2 x week for 3 weeks and 1 x week for 2 weeks. Call back # [phone] and its okay to leave a voicemail.

## 2020-08-16 ENCOUNTER — Telehealth: Payer: Self-pay

## 2020-08-16 DIAGNOSIS — F250 Schizoaffective disorder, bipolar type: Secondary | ICD-10-CM | POA: Diagnosis not present

## 2020-08-16 DIAGNOSIS — E1142 Type 2 diabetes mellitus with diabetic polyneuropathy: Secondary | ICD-10-CM | POA: Diagnosis not present

## 2020-08-16 DIAGNOSIS — J42 Unspecified chronic bronchitis: Secondary | ICD-10-CM | POA: Diagnosis not present

## 2020-08-16 DIAGNOSIS — E039 Hypothyroidism, unspecified: Secondary | ICD-10-CM | POA: Diagnosis not present

## 2020-08-16 DIAGNOSIS — F17210 Nicotine dependence, cigarettes, uncomplicated: Secondary | ICD-10-CM | POA: Diagnosis not present

## 2020-08-16 DIAGNOSIS — D649 Anemia, unspecified: Secondary | ICD-10-CM | POA: Diagnosis not present

## 2020-08-16 DIAGNOSIS — R2681 Unsteadiness on feet: Secondary | ICD-10-CM | POA: Diagnosis not present

## 2020-08-16 DIAGNOSIS — J449 Chronic obstructive pulmonary disease, unspecified: Secondary | ICD-10-CM | POA: Diagnosis not present

## 2020-08-16 DIAGNOSIS — M160 Bilateral primary osteoarthritis of hip: Secondary | ICD-10-CM | POA: Diagnosis not present

## 2020-08-16 NOTE — Telephone Encounter (Signed)
Sondra Barges- is calling to say that pt has edema around the right Knee--please call is she needs to change anything

## 2020-08-18 DIAGNOSIS — D649 Anemia, unspecified: Secondary | ICD-10-CM | POA: Diagnosis not present

## 2020-08-18 DIAGNOSIS — J42 Unspecified chronic bronchitis: Secondary | ICD-10-CM | POA: Diagnosis not present

## 2020-08-18 DIAGNOSIS — M160 Bilateral primary osteoarthritis of hip: Secondary | ICD-10-CM | POA: Diagnosis not present

## 2020-08-18 DIAGNOSIS — E1142 Type 2 diabetes mellitus with diabetic polyneuropathy: Secondary | ICD-10-CM | POA: Diagnosis not present

## 2020-08-18 DIAGNOSIS — R2681 Unsteadiness on feet: Secondary | ICD-10-CM | POA: Diagnosis not present

## 2020-08-18 DIAGNOSIS — F250 Schizoaffective disorder, bipolar type: Secondary | ICD-10-CM | POA: Diagnosis not present

## 2020-08-18 DIAGNOSIS — E039 Hypothyroidism, unspecified: Secondary | ICD-10-CM | POA: Diagnosis not present

## 2020-08-18 DIAGNOSIS — J449 Chronic obstructive pulmonary disease, unspecified: Secondary | ICD-10-CM | POA: Diagnosis not present

## 2020-08-18 DIAGNOSIS — F17210 Nicotine dependence, cigarettes, uncomplicated: Secondary | ICD-10-CM | POA: Diagnosis not present

## 2020-08-18 NOTE — Telephone Encounter (Signed)
Elevate the affected leg, if ankle is swollen, apply cool compress, no medication change

## 2020-08-18 NOTE — Telephone Encounter (Signed)
Pt advised of recommendation with verbal understanding  

## 2020-08-18 NOTE — Telephone Encounter (Signed)
LVM for pt to call the office.

## 2020-08-23 ENCOUNTER — Encounter (HOSPITAL_COMMUNITY): Payer: Self-pay | Admitting: Internal Medicine

## 2020-08-23 ENCOUNTER — Other Ambulatory Visit: Payer: Self-pay

## 2020-08-23 ENCOUNTER — Ambulatory Visit (INDEPENDENT_AMBULATORY_CARE_PROVIDER_SITE_OTHER): Payer: Medicare HMO | Admitting: Psychiatry

## 2020-08-23 DIAGNOSIS — F250 Schizoaffective disorder, bipolar type: Secondary | ICD-10-CM | POA: Diagnosis not present

## 2020-08-23 DIAGNOSIS — J449 Chronic obstructive pulmonary disease, unspecified: Secondary | ICD-10-CM | POA: Diagnosis not present

## 2020-08-23 DIAGNOSIS — J42 Unspecified chronic bronchitis: Secondary | ICD-10-CM | POA: Diagnosis not present

## 2020-08-23 DIAGNOSIS — F331 Major depressive disorder, recurrent, moderate: Secondary | ICD-10-CM

## 2020-08-23 DIAGNOSIS — E1142 Type 2 diabetes mellitus with diabetic polyneuropathy: Secondary | ICD-10-CM | POA: Diagnosis not present

## 2020-08-23 DIAGNOSIS — M160 Bilateral primary osteoarthritis of hip: Secondary | ICD-10-CM | POA: Diagnosis not present

## 2020-08-23 DIAGNOSIS — F17210 Nicotine dependence, cigarettes, uncomplicated: Secondary | ICD-10-CM | POA: Diagnosis not present

## 2020-08-23 DIAGNOSIS — D649 Anemia, unspecified: Secondary | ICD-10-CM | POA: Diagnosis not present

## 2020-08-23 DIAGNOSIS — R2681 Unsteadiness on feet: Secondary | ICD-10-CM | POA: Diagnosis not present

## 2020-08-23 DIAGNOSIS — E039 Hypothyroidism, unspecified: Secondary | ICD-10-CM | POA: Diagnosis not present

## 2020-08-23 NOTE — Progress Notes (Signed)
Virtual Visit via Telephone Note  I connected with [name] on 08/23/20 at  4:00 PM EDT by telephone and verified that I am speaking with the correct person using two identifiers.  Location: Patient: Home Provider: [place] office    I discussed the limitations, risks, security and privacy concerns of performing an evaluation and management service by telephone and the availability of in person appointments. I also discussed with the patient that there may be a patient responsible charge related to this service. The patient expressed understanding and agreed to proceed.  I provided 50 minutes of non-face-to-face time during this encounter.   [name], LCSW            THERAPIST PROGRESS NOTE    Session Time: Monday 08/23/2020  4:00 PM - 4:50  Participation Level: Active  Behavioral Response:  Less depressed, anxious, talkative, alert   Type of Therapy: Individual Therapy  Treatment Goals addressed: Patient wants to learn how to improve coping skills to manage chronic pain and health issues/ improve mood    Interventions: CBT and Supportive             Summary: [name] is a 70 y.o. female who presents with  long standing history of recurrent periods  of depression beginning when she was [age] and her favorite uncle died. Patient reports multiple psychiatric hospitlaizations due to depression and suicidal ideations with the last one occuring in 1997. Patient has participated in outpatient psychotherapy and medication management intermittently since age 70.  She currently is seeing psychiatrist Dr. Harrington Challenger . Prior to this, she was seen at [facility]. Patient also has had ECT at [facility]. Symptoms have worsened in recent months due to family stress and have  included depressed mood, anxiety, excessive worry, and tearfulness.         Patient's last contact was by virtual visit via telephone about 3-4  weeks ago.  She reports improved mood, decreased irritability,  and increased behavioral activation since last session.  In addition to attending church, patient reports reading, sewing, and watching various television programming.  She reports increased efforts regarding working on self.  Per patient's report, she has been less aggressive in her communication.  She reports trying to become more aware of her triggers of anger and trying to pause before responding.  She cites recent examples regarding interaction with her brother and other family members.  Patient also reports trying to work on her physical health and reports participating in physical therapy at her home 3 times a week.  Patient also has been researching ways to manage her legal and financial affairs.  She expresses some frustration regarding her niece being her power of attorney.  Patient is looking forward to her 55 year old grandchild staying with her through the week for the next several weeks.    Suicidal/Homicidal: Nowithout intent/plan      Therapist Response: Reviewed symptoms, praised and reinforced patient's continued efforts to maintain involvement in activity, praised and reinforced patient's efforts to pause before responding, discussed effects on her mood/behavior/and interaction with others, discussed stressors, facilitated expression of thoughts and feelings, validated feelings, discussed ways to have active involvement and interaction with grandchild  Plan: Return again in 2 weeks.  Diagnosis: Axis I: MDD, Recurrent, moderate    Axis II: Deferred    Hyun Marsalis E Ouida Abeyta, LCSW

## 2020-08-24 DIAGNOSIS — F250 Schizoaffective disorder, bipolar type: Secondary | ICD-10-CM | POA: Diagnosis not present

## 2020-08-24 DIAGNOSIS — R531 Weakness: Secondary | ICD-10-CM | POA: Diagnosis not present

## 2020-08-24 DIAGNOSIS — M160 Bilateral primary osteoarthritis of hip: Secondary | ICD-10-CM | POA: Diagnosis not present

## 2020-08-24 DIAGNOSIS — F17210 Nicotine dependence, cigarettes, uncomplicated: Secondary | ICD-10-CM | POA: Diagnosis not present

## 2020-08-24 DIAGNOSIS — D649 Anemia, unspecified: Secondary | ICD-10-CM | POA: Diagnosis not present

## 2020-08-24 DIAGNOSIS — E1142 Type 2 diabetes mellitus with diabetic polyneuropathy: Secondary | ICD-10-CM | POA: Diagnosis not present

## 2020-08-24 DIAGNOSIS — J449 Chronic obstructive pulmonary disease, unspecified: Secondary | ICD-10-CM | POA: Diagnosis not present

## 2020-08-24 DIAGNOSIS — R2681 Unsteadiness on feet: Secondary | ICD-10-CM | POA: Diagnosis not present

## 2020-08-24 DIAGNOSIS — J42 Unspecified chronic bronchitis: Secondary | ICD-10-CM | POA: Diagnosis not present

## 2020-08-24 DIAGNOSIS — E039 Hypothyroidism, unspecified: Secondary | ICD-10-CM | POA: Diagnosis not present

## 2020-08-25 DIAGNOSIS — E039 Hypothyroidism, unspecified: Secondary | ICD-10-CM | POA: Diagnosis not present

## 2020-08-25 DIAGNOSIS — D649 Anemia, unspecified: Secondary | ICD-10-CM | POA: Diagnosis not present

## 2020-08-25 DIAGNOSIS — F17210 Nicotine dependence, cigarettes, uncomplicated: Secondary | ICD-10-CM | POA: Diagnosis not present

## 2020-08-25 DIAGNOSIS — J42 Unspecified chronic bronchitis: Secondary | ICD-10-CM | POA: Diagnosis not present

## 2020-08-25 DIAGNOSIS — M160 Bilateral primary osteoarthritis of hip: Secondary | ICD-10-CM | POA: Diagnosis not present

## 2020-08-25 DIAGNOSIS — R2681 Unsteadiness on feet: Secondary | ICD-10-CM | POA: Diagnosis not present

## 2020-08-25 DIAGNOSIS — J449 Chronic obstructive pulmonary disease, unspecified: Secondary | ICD-10-CM | POA: Diagnosis not present

## 2020-08-25 DIAGNOSIS — E1142 Type 2 diabetes mellitus with diabetic polyneuropathy: Secondary | ICD-10-CM | POA: Diagnosis not present

## 2020-08-25 DIAGNOSIS — F250 Schizoaffective disorder, bipolar type: Secondary | ICD-10-CM | POA: Diagnosis not present

## 2020-08-26 DIAGNOSIS — M4716 Other spondylosis with myelopathy, lumbar region: Secondary | ICD-10-CM | POA: Diagnosis not present

## 2020-08-26 DIAGNOSIS — R29898 Other symptoms and signs involving the musculoskeletal system: Secondary | ICD-10-CM | POA: Diagnosis not present

## 2020-08-27 ENCOUNTER — Telehealth: Payer: Self-pay

## 2020-08-27 DIAGNOSIS — F17210 Nicotine dependence, cigarettes, uncomplicated: Secondary | ICD-10-CM | POA: Diagnosis not present

## 2020-08-27 DIAGNOSIS — D649 Anemia, unspecified: Secondary | ICD-10-CM | POA: Diagnosis not present

## 2020-08-27 DIAGNOSIS — E1142 Type 2 diabetes mellitus with diabetic polyneuropathy: Secondary | ICD-10-CM | POA: Diagnosis not present

## 2020-08-27 DIAGNOSIS — E039 Hypothyroidism, unspecified: Secondary | ICD-10-CM | POA: Diagnosis not present

## 2020-08-27 DIAGNOSIS — J449 Chronic obstructive pulmonary disease, unspecified: Secondary | ICD-10-CM | POA: Diagnosis not present

## 2020-08-27 DIAGNOSIS — R2681 Unsteadiness on feet: Secondary | ICD-10-CM | POA: Diagnosis not present

## 2020-08-27 DIAGNOSIS — J42 Unspecified chronic bronchitis: Secondary | ICD-10-CM | POA: Diagnosis not present

## 2020-08-27 DIAGNOSIS — M160 Bilateral primary osteoarthritis of hip: Secondary | ICD-10-CM | POA: Diagnosis not present

## 2020-08-27 DIAGNOSIS — F250 Schizoaffective disorder, bipolar type: Secondary | ICD-10-CM | POA: Diagnosis not present

## 2020-08-27 NOTE — Telephone Encounter (Signed)
Left voice mail

## 2020-08-27 NOTE — Telephone Encounter (Signed)
Pain 7 of 10--Fell also --[facility] is there with PT

## 2020-08-30 DIAGNOSIS — E1142 Type 2 diabetes mellitus with diabetic polyneuropathy: Secondary | ICD-10-CM | POA: Diagnosis not present

## 2020-08-30 DIAGNOSIS — E039 Hypothyroidism, unspecified: Secondary | ICD-10-CM | POA: Diagnosis not present

## 2020-08-30 DIAGNOSIS — F250 Schizoaffective disorder, bipolar type: Secondary | ICD-10-CM | POA: Diagnosis not present

## 2020-08-30 DIAGNOSIS — F17210 Nicotine dependence, cigarettes, uncomplicated: Secondary | ICD-10-CM | POA: Diagnosis not present

## 2020-08-30 DIAGNOSIS — M160 Bilateral primary osteoarthritis of hip: Secondary | ICD-10-CM | POA: Diagnosis not present

## 2020-08-30 DIAGNOSIS — D649 Anemia, unspecified: Secondary | ICD-10-CM | POA: Diagnosis not present

## 2020-08-30 DIAGNOSIS — J449 Chronic obstructive pulmonary disease, unspecified: Secondary | ICD-10-CM | POA: Diagnosis not present

## 2020-08-30 DIAGNOSIS — J42 Unspecified chronic bronchitis: Secondary | ICD-10-CM | POA: Diagnosis not present

## 2020-08-30 DIAGNOSIS — R2681 Unsteadiness on feet: Secondary | ICD-10-CM | POA: Diagnosis not present

## 2020-08-31 ENCOUNTER — Telehealth: Payer: Self-pay

## 2020-08-31 DIAGNOSIS — F250 Schizoaffective disorder, bipolar type: Secondary | ICD-10-CM | POA: Diagnosis not present

## 2020-08-31 DIAGNOSIS — E1142 Type 2 diabetes mellitus with diabetic polyneuropathy: Secondary | ICD-10-CM | POA: Diagnosis not present

## 2020-08-31 DIAGNOSIS — J449 Chronic obstructive pulmonary disease, unspecified: Secondary | ICD-10-CM | POA: Diagnosis not present

## 2020-08-31 DIAGNOSIS — F17210 Nicotine dependence, cigarettes, uncomplicated: Secondary | ICD-10-CM | POA: Diagnosis not present

## 2020-08-31 DIAGNOSIS — D649 Anemia, unspecified: Secondary | ICD-10-CM | POA: Diagnosis not present

## 2020-08-31 DIAGNOSIS — E039 Hypothyroidism, unspecified: Secondary | ICD-10-CM | POA: Diagnosis not present

## 2020-08-31 DIAGNOSIS — J42 Unspecified chronic bronchitis: Secondary | ICD-10-CM | POA: Diagnosis not present

## 2020-08-31 DIAGNOSIS — M160 Bilateral primary osteoarthritis of hip: Secondary | ICD-10-CM | POA: Diagnosis not present

## 2020-08-31 DIAGNOSIS — R2681 Unsteadiness on feet: Secondary | ICD-10-CM | POA: Diagnosis not present

## 2020-08-31 NOTE — Telephone Encounter (Signed)
Spoke with pt, she is fine. Is not in any pain at this moment.

## 2020-08-31 NOTE — Telephone Encounter (Signed)
[name] wants to continue PT for 1 week 4 pt is not moving well

## 2020-09-01 ENCOUNTER — Encounter: Payer: Self-pay | Admitting: Family Medicine

## 2020-09-01 ENCOUNTER — Ambulatory Visit (INDEPENDENT_AMBULATORY_CARE_PROVIDER_SITE_OTHER): Payer: Medicare HMO | Admitting: Family Medicine

## 2020-09-01 ENCOUNTER — Other Ambulatory Visit: Payer: Self-pay

## 2020-09-01 ENCOUNTER — Other Ambulatory Visit: Payer: Self-pay | Admitting: Student

## 2020-09-01 VITALS — BP 130/72 | HR 68 | Temp 98.9°F | Resp 18 | Ht 59.0 in | Wt 169.0 lb

## 2020-09-01 DIAGNOSIS — F3170 Bipolar disorder, currently in remission, most recent episode unspecified: Secondary | ICD-10-CM | POA: Diagnosis not present

## 2020-09-01 DIAGNOSIS — I10 Essential (primary) hypertension: Secondary | ICD-10-CM

## 2020-09-01 DIAGNOSIS — E559 Vitamin D deficiency, unspecified: Secondary | ICD-10-CM | POA: Diagnosis not present

## 2020-09-01 DIAGNOSIS — E785 Hyperlipidemia, unspecified: Secondary | ICD-10-CM | POA: Diagnosis not present

## 2020-09-01 DIAGNOSIS — E669 Obesity, unspecified: Secondary | ICD-10-CM | POA: Diagnosis not present

## 2020-09-01 DIAGNOSIS — R296 Repeated falls: Secondary | ICD-10-CM

## 2020-09-01 DIAGNOSIS — E1142 Type 2 diabetes mellitus with diabetic polyneuropathy: Secondary | ICD-10-CM

## 2020-09-01 DIAGNOSIS — Z1321 Encounter for screening for nutritional disorder: Secondary | ICD-10-CM | POA: Diagnosis not present

## 2020-09-01 DIAGNOSIS — G894 Chronic pain syndrome: Secondary | ICD-10-CM | POA: Diagnosis not present

## 2020-09-01 DIAGNOSIS — Z7689 Persons encountering health services in other specified circumstances: Secondary | ICD-10-CM | POA: Diagnosis not present

## 2020-09-01 DIAGNOSIS — E1143 Type 2 diabetes mellitus with diabetic autonomic (poly)neuropathy: Secondary | ICD-10-CM | POA: Diagnosis not present

## 2020-09-01 NOTE — Patient Instructions (Addendum)
F/U as before, call  if you need me sooner  Please get labs today that were recently ordered, also microalb    Ensure you drink 64 ounces water every day and change position slowly,   CONGRATS on being nicotine free since May, keep it up!  Need covid booster this is past due  Thanks for choosing [facility], we consider it a privelige to serve you.

## 2020-09-01 NOTE — Telephone Encounter (Signed)
verbal orders given 

## 2020-09-02 ENCOUNTER — Telehealth: Payer: Self-pay

## 2020-09-02 DIAGNOSIS — J42 Unspecified chronic bronchitis: Secondary | ICD-10-CM | POA: Diagnosis not present

## 2020-09-02 DIAGNOSIS — F17210 Nicotine dependence, cigarettes, uncomplicated: Secondary | ICD-10-CM | POA: Diagnosis not present

## 2020-09-02 DIAGNOSIS — F250 Schizoaffective disorder, bipolar type: Secondary | ICD-10-CM | POA: Diagnosis not present

## 2020-09-02 DIAGNOSIS — M160 Bilateral primary osteoarthritis of hip: Secondary | ICD-10-CM | POA: Diagnosis not present

## 2020-09-02 DIAGNOSIS — E1142 Type 2 diabetes mellitus with diabetic polyneuropathy: Secondary | ICD-10-CM | POA: Diagnosis not present

## 2020-09-02 DIAGNOSIS — J449 Chronic obstructive pulmonary disease, unspecified: Secondary | ICD-10-CM | POA: Diagnosis not present

## 2020-09-02 DIAGNOSIS — R2681 Unsteadiness on feet: Secondary | ICD-10-CM | POA: Diagnosis not present

## 2020-09-02 DIAGNOSIS — D649 Anemia, unspecified: Secondary | ICD-10-CM | POA: Diagnosis not present

## 2020-09-02 DIAGNOSIS — E039 Hypothyroidism, unspecified: Secondary | ICD-10-CM | POA: Diagnosis not present

## 2020-09-02 NOTE — Telephone Encounter (Signed)
Tillie Rung from [facility] called said patient had an order for cpap machine but has not heard anything from the supplier, patient having difficulty sleeping and needs her cpap machine. Can this order be sent into another supplier.

## 2020-09-03 LAB — VITAMIN D 25 HYDROXY (VIT D DEFICIENCY, FRACTURES): Vit D, 25-Hydroxy: 45.5 ng/mL (ref 30.0–100.0)

## 2020-09-03 LAB — CMP14+EGFR
ALT: 27 IU/L (ref 0–32)
AST: 27 IU/L (ref 0–40)
Albumin/Globulin Ratio: 2 (ref 1.2–2.2)
Albumin: 4.7 g/dL (ref 3.8–4.8)
Alkaline Phosphatase: 97 IU/L (ref 44–121)
BUN/Creatinine Ratio: 25 (ref 12–28)
BUN: 26 mg/dL (ref 8–27)
Bilirubin Total: 0.2 mg/dL (ref 0.0–1.2)
CO2: 24 mmol/L (ref 20–29)
Calcium: 9.6 mg/dL (ref 8.7–10.3)
Chloride: 106 mmol/L (ref 96–106)
Creatinine, Ser: 1.05 mg/dL — ABNORMAL HIGH (ref 0.57–1.00)
Globulin, Total: 2.4 g/dL (ref 1.5–4.5)
Glucose: 93 mg/dL (ref 65–99)
Potassium: 4.7 mmol/L (ref 3.5–5.2)
Sodium: 144 mmol/L (ref 134–144)
Total Protein: 7.1 g/dL (ref 6.0–8.5)
eGFR: 57 mL/min/{1.73_m2} — ABNORMAL LOW (ref >59)

## 2020-09-03 LAB — LIPID PANEL
Chol/HDL Ratio: 3.2 ratio (ref 0.0–4.4)
Cholesterol, Total: 128 mg/dL (ref 100–199)
HDL: 40 mg/dL (ref >39)
LDL Chol Calc (NIH): 51 mg/dL (ref 0–99)
Triglycerides: 232 mg/dL — ABNORMAL HIGH (ref 0–149)
VLDL Cholesterol Cal: 37 mg/dL (ref 5–40)

## 2020-09-03 LAB — MICROALBUMIN, URINE: Microalbumin, Urine: 34.5 ug/mL

## 2020-09-03 NOTE — Telephone Encounter (Signed)
Contacted patient, it has been taken care of per patient.

## 2020-09-03 NOTE — Telephone Encounter (Signed)
We don't prescribe CPAP machines. This comes from Pulmonary. She would need to contact them. I didn't see a return number for [facility]

## 2020-09-05 ENCOUNTER — Encounter: Payer: Self-pay | Admitting: Family Medicine

## 2020-09-05 NOTE — Assessment & Plan Note (Signed)
Stable and managed by Psych 

## 2020-09-05 NOTE — Assessment & Plan Note (Signed)
[name] is reminded of the importance of commitment to daily physical activity for 30 minutes or more, as able and the need to limit carbohydrate intake to 30 to 60 grams per meal to help with blood sugar control.   The need to take medication as prescribed, test blood sugar as directed, and to call between visits if there is a concern that blood sugar is uncontrolled is also discussed.   [name] is reminded of the importance of daily foot exam, annual eye examination, and good blood sugar, blood pressure and cholesterol control. Managed by Endo, it is possible that she is experiencing hypoglycemia when light headed, very important that she eat regularly, this is diuscussed  Diabetic Labs Latest Ref Rng & Units 09/01/2020 07/29/2020 05/04/2020 03/25/2020 02/20/2020  HbA1c 4.0 - 5.6 % - 5.8(A) - 5.3 -  Microalbumin Not Estab. ug/mL - - - - -  Micro/Creat Ratio 0 - 29 mg/g creat - - - - -  Chol 100 - 199 mg/dL 128 - - - -  HDL >39 mg/dL 40 - - - -  Calc LDL 0 - 99 mg/dL 51 - - - -  Triglycerides 0 - 149 mg/dL 232(H) - - - -  Creatinine 0.57 - 1.00 mg/dL 1.05(H) - 1.13(H) - 1.50(H)  GFR >60.00 mL/min - - - - -   BP/Weight 09/01/2020 08/10/2020 07/29/2020 07/07/2020 07/06/2020 06/14/2020 3/53/9122  Systolic BP 583 462 194 712 527 129 290  Diastolic BP 72 66 68 67 67 58 62  Wt. (Lbs) 169 169 172.2 170 171 170.6 169.6  BMI 34.13 33.28 32.54 32.12 32.31 32.23 32.05  Some encounter information is confidential and restricted. Go to Review Flowsheets activity to see all data.   Foot/eye exam completion dates Latest Ref Rng & Units 05/26/2020 01/01/2019  Eye Exam No Retinopathy No Retinopathy -  Foot exam Order - - -  Foot Form Completion - - Done

## 2020-09-05 NOTE — Assessment & Plan Note (Signed)
  Patient re-educated about  the importance of commitment to a  minimum of 150 minutes of exercise per week as able.  The importance of healthy food choices with portion control discussed, as well as eating regularly and within a 12 hour window most days. The need to choose "clean , green" food 50 to 75% of the time is discussed, as well as to make water the primary drink and set a goal of 64 ounces water daily.    Weight /BMI 09/01/2020 08/10/2020 07/29/2020  WEIGHT 169 lb 169 lb 172 lb 3.2 oz  HEIGHT 4\' 11"  4' 11.75" 5\' 1"   BMI 34.13 kg/m2 33.28 kg/m2 32.54 kg/m2  Some encounter information is confidential and restricted. Go to Review Flowsheets activity to see all data.

## 2020-09-05 NOTE — Progress Notes (Signed)
[name]     MRN: [id number]      DOB: 05-30-1950   HPI [name] is here for follow up and re-evaluation of chronic medical conditions, medication management and review of any available recent lab and radiology data.  Preventive health is updated, specifically  Cancer screening and Immunization.   Questions or concerns regarding consultations or procedures which the PT has had in the interim are  addressed. The PT denies any adverse reactions to current medications since the last visit.  2 month h/o intermittent light headedness, cannot verify that her water intake is adequate, has had brain scan and carotid doppler studies in the past 10 months. She has a meningioma and no significant arterial obstruction.  Blood sugar control is very tight and I advise her of the importance of eating regularly to prevent / reduce the risk of hypoglycemia  ROS Denies recent fever or chills. Denies sinus pressure, nasal congestion, ear pain or sore throat. Denies chest congestion, productive cough or wheezing. Denies chest pains, palpitations and leg swelling Denies abdominal pain, nausea, vomiting,diarrhea or constipation.   Denies dysuria, frequency, hesitancy or incontinence. C/o chronic  joint pain,  and limitation in mobility. Denies headaches, seizures, c/o lower extremity numbness, and  tingling. Denies uncontrolled  depression, anxiety or insomnia. Denies skin break down or rash.   PE  BP 130/72   Pulse 68   Temp 98.9 F (37.2 C)   Resp 18   Ht 4\' 11"  (1.499 m)   Wt 169 lb (76.7 kg)   SpO2 92%   BMI 34.13 kg/m   Patient alert and oriented and in no cardiopulmonary distress.  HEENT: No facial asymmetry, EOMI,     Neck decreased ROM.  Chest: Clear to auscultation bilaterally. Decreased air entry throughout CVS: S1, S2 no murmurs, no S3.Regular rate.  ABD: Soft non tender.   Ext: No edema  MS: decreased  ROM spine, shoulders, hips and knees.  Skin: Intact, no ulcerations  or rash noted.  Psych: Good eye contact, normal affect. Memory intact not anxious or depressed appearing.  CNS: CN 2-12 intact, power,  normal throughout.no focal deficits noted.   Assessment & Plan  Diabetic polyneuropathy associated with type 2 diabetes mellitus [facility]) [name] is reminded of the importance of commitment to daily physical activity for 30 minutes or more, as able and the need to limit carbohydrate intake to 30 to 60 grams per meal to help with blood sugar control.   The need to take medication as prescribed, test blood sugar as directed, and to call between visits if there is a concern that blood sugar is uncontrolled is also discussed.   [name] is reminded of the importance of daily foot exam, annual eye examination, and good blood sugar, blood pressure and cholesterol control. Managed by Endo, it is possible that she is experiencing hypoglycemia when light headed, very important that she eat regularly, this is diuscussed  Diabetic Labs Latest Ref Rng & Units 09/01/2020 07/29/2020 05/04/2020 03/25/2020 02/20/2020  HbA1c 4.0 - 5.6 % - 5.8(A) - 5.3 -  Microalbumin Not Estab. ug/mL - - - - -  Micro/Creat Ratio 0 - 29 mg/g creat - - - - -  Chol 100 - 199 mg/dL 128 - - - -  HDL >39 mg/dL 40 - - - -  Calc LDL 0 - 99 mg/dL 51 - - - -  Triglycerides 0 - 149 mg/dL 232(H) - - - -  Creatinine 0.57 -  1.00 mg/dL 1.05(H) - 1.13(H) - 1.50(H)  GFR >60.00 mL/min - - - - -   BP/Weight 09/01/2020 08/10/2020 07/29/2020 07/07/2020 07/06/2020 06/14/2020 2/87/8676  Systolic BP 720 947 096 283 662 947 654  Diastolic BP 72 66 68 67 67 58 62  Wt. (Lbs) 169 169 172.2 170 171 170.6 169.6  BMI 34.13 33.28 32.54 32.12 32.31 32.23 32.05  Some encounter information is confidential and restricted. Go to Review Flowsheets activity to see all data.   Foot/eye exam completion dates Latest Ref Rng & Units 05/26/2020 01/01/2019  Eye Exam No Retinopathy No Retinopathy -  Foot exam Order - - -  Foot Form  Completion - - Done         Chronic pain syndrome Managed through pain clinic  Essential hypertension Controlled, no change in medication DASH diet and commitment to daily physical activity for a minimum of 30 minutes discussed and encouraged, as a part of hypertension management. The importance of attaining a healthy weight is also discussed.  BP/Weight 09/01/2020 08/10/2020 07/29/2020 07/07/2020 07/06/2020 06/14/2020 6/50/3546  Systolic BP 568 127 517 001 749 449 675  Diastolic BP 72 66 68 67 67 58 62  Wt. (Lbs) 169 169 172.2 170 171 170.6 169.6  BMI 34.13 33.28 32.54 32.12 32.31 32.23 32.05  Some encounter information is confidential and restricted. Go to Review Flowsheets activity to see all data.       Obesity (BMI 30.0-34.9)  Patient re-educated about  the importance of commitment to a  minimum of 150 minutes of exercise per week as able.  The importance of healthy food choices with portion control discussed, as well as eating regularly and within a 12 hour window most days. The need to choose "clean , green" food 50 to 75% of the time is discussed, as well as to make water the primary drink and set a goal of 64 ounces water daily.    Weight /BMI 09/01/2020 08/10/2020 07/29/2020  WEIGHT 169 lb 169 lb 172 lb 3.2 oz  HEIGHT 4\' 11"  4' 11.75" 5\' 1"   BMI 34.13 kg/m2 33.28 kg/m2 32.54 kg/m2  Some encounter information is confidential and restricted. Go to Review Flowsheets activity to see all data.      Recurrent falls Home safety reviewed  Bipolar disorder ([place]) Stable and managed by Psych

## 2020-09-05 NOTE — Assessment & Plan Note (Signed)
Managed through pain clinic 

## 2020-09-05 NOTE — Assessment & Plan Note (Signed)
Home safety reviewed 

## 2020-09-05 NOTE — Assessment & Plan Note (Signed)
Controlled, no change in medication DASH diet and commitment to daily physical activity for a minimum of 30 minutes discussed and encouraged, as a part of hypertension management. The importance of attaining a healthy weight is also discussed.  BP/Weight 09/01/2020 08/10/2020 07/29/2020 07/07/2020 07/06/2020 06/14/2020 6/64/4034  Systolic BP 742 595 638 756 433 295 188  Diastolic BP 72 66 68 67 67 58 62  Wt. (Lbs) 169 169 172.2 170 171 170.6 169.6  BMI 34.13 33.28 32.54 32.12 32.31 32.23 32.05  Some encounter information is confidential and restricted. Go to Review Flowsheets activity to see all data.

## 2020-09-06 ENCOUNTER — Other Ambulatory Visit: Payer: Self-pay

## 2020-09-06 ENCOUNTER — Ambulatory Visit (INDEPENDENT_AMBULATORY_CARE_PROVIDER_SITE_OTHER): Payer: Medicare HMO | Admitting: Psychiatry

## 2020-09-06 DIAGNOSIS — R2681 Unsteadiness on feet: Secondary | ICD-10-CM | POA: Diagnosis not present

## 2020-09-06 DIAGNOSIS — F17210 Nicotine dependence, cigarettes, uncomplicated: Secondary | ICD-10-CM | POA: Diagnosis not present

## 2020-09-06 DIAGNOSIS — F331 Major depressive disorder, recurrent, moderate: Secondary | ICD-10-CM | POA: Diagnosis not present

## 2020-09-06 DIAGNOSIS — D649 Anemia, unspecified: Secondary | ICD-10-CM | POA: Diagnosis not present

## 2020-09-06 DIAGNOSIS — M160 Bilateral primary osteoarthritis of hip: Secondary | ICD-10-CM | POA: Diagnosis not present

## 2020-09-06 DIAGNOSIS — E039 Hypothyroidism, unspecified: Secondary | ICD-10-CM | POA: Diagnosis not present

## 2020-09-06 DIAGNOSIS — F250 Schizoaffective disorder, bipolar type: Secondary | ICD-10-CM | POA: Diagnosis not present

## 2020-09-06 DIAGNOSIS — E1142 Type 2 diabetes mellitus with diabetic polyneuropathy: Secondary | ICD-10-CM | POA: Diagnosis not present

## 2020-09-06 DIAGNOSIS — J42 Unspecified chronic bronchitis: Secondary | ICD-10-CM | POA: Diagnosis not present

## 2020-09-06 DIAGNOSIS — J449 Chronic obstructive pulmonary disease, unspecified: Secondary | ICD-10-CM | POA: Diagnosis not present

## 2020-09-06 NOTE — Progress Notes (Signed)
Virtual Visit via Telephone Note  I connected with Nonna Renninger Ciszek on 09/06/20 at  4:00 PM EDT by telephone and verified that I am speaking with the correct person using two identifiers.  Location: Patient:  Home Provider: [place] office    I discussed the limitations, risks, security and privacy concerns of performing an evaluation and management service by telephone and the availability of in person appointments. I also discussed with the patient that there may be a patient responsible charge related to this service. The patient expressed understanding and agreed to proceed.   I provided 48 minutes of non-face-to-face time during this encounter.   [name], LCSW            THERAPIST PROGRESS NOTE    Session Time: Monday 09/06/2020  4:00 PM - 4:48 PM Participation Level: Active  Behavioral Response:  Less depressed, anxious, talkative, alert   Type of Therapy: Individual Therapy  Treatment Goals addressed: Patient wants to learn how to improve coping skills to manage chronic pain and health issues/ improve mood    Interventions: CBT and Supportive             Summary: Hattie Pine Jenifer is a 70 y.o. female who presents with  long standing history of recurrent periods  of depression beginning when she was [age] and her favorite uncle died. Patient reports multiple psychiatric hospitlaizations due to depression and suicidal ideations with the last one occuring in 1997. Patient has participated in outpatient psychotherapy and medication management intermittently since age 43.  She currently is seeing psychiatrist Dr. Harrington Challenger . Prior to this, she was seen at [facility]. Patient also has had ECT at [facility]. Symptoms have worsened in recent months due to family stress and have  included depressed mood, anxiety, excessive worry, and tearfulness.         Patient's last contact was by virtual visit via telephone about 3-4  weeks ago.  She reports continued improved mood,  decreased irritability, and behavioral activation since last session.  She reports continuing to attend church, read, so, and watch various television programs.  She also has enjoyed her 16-year-old granddaughter staying with her during the week.  Patient reports she has had a rough time with her knee as she has experienced increased knee pain.  However, she states that she is not going to let it get her down.  Patient reports using distracting activities to try to cope with the pain.  She also is pleased that she has decreased her use of pain medication.  She continues to express frustration regarding her niece and her role in managing patient's legal and financial affairs.  Patient reports recent incident becoming very upset with niece but using helpful coping strategies to manage her anger.  Patient reports being more aware of her triggers and thoughts she reports disengaging to avoid escalating arguments with knees rather than responding the way she would have in the past.  Patient is pleased she is going to see a Education officer, museum regarding her legal and financial affairs.   Suicidal/Homicidal: Nowithout intent/plan      Therapist Response: Reviewed symptoms, praised and reinforced patient's continued efforts to maintain involvement in activity, praised and reinforced patient's coping strategies to manage pain, discussed effects , praised and reinforced patient's efforts to pause before responding, discussed stressors, facilitated expression of thoughts and feelings, validated feelings, assisted patient identify her goals, options, and resources regarding addressing her financial and legal concerns.  Plan: Return again  in 2 weeks.  Diagnosis: Axis I: MDD, Recurrent, moderate    Axis II: Deferred    Allen Egerton E Aleesa Sweigert, LCSW

## 2020-09-07 ENCOUNTER — Telehealth (HOSPITAL_COMMUNITY): Payer: Self-pay | Admitting: Psychiatry

## 2020-09-07 DIAGNOSIS — F250 Schizoaffective disorder, bipolar type: Secondary | ICD-10-CM | POA: Diagnosis not present

## 2020-09-07 DIAGNOSIS — J42 Unspecified chronic bronchitis: Secondary | ICD-10-CM | POA: Diagnosis not present

## 2020-09-07 DIAGNOSIS — E1142 Type 2 diabetes mellitus with diabetic polyneuropathy: Secondary | ICD-10-CM | POA: Diagnosis not present

## 2020-09-07 DIAGNOSIS — F17210 Nicotine dependence, cigarettes, uncomplicated: Secondary | ICD-10-CM | POA: Diagnosis not present

## 2020-09-07 DIAGNOSIS — R2681 Unsteadiness on feet: Secondary | ICD-10-CM | POA: Diagnosis not present

## 2020-09-07 DIAGNOSIS — M160 Bilateral primary osteoarthritis of hip: Secondary | ICD-10-CM | POA: Diagnosis not present

## 2020-09-07 DIAGNOSIS — D649 Anemia, unspecified: Secondary | ICD-10-CM | POA: Diagnosis not present

## 2020-09-07 DIAGNOSIS — E039 Hypothyroidism, unspecified: Secondary | ICD-10-CM | POA: Diagnosis not present

## 2020-09-07 DIAGNOSIS — J449 Chronic obstructive pulmonary disease, unspecified: Secondary | ICD-10-CM | POA: Diagnosis not present

## 2020-09-07 NOTE — Telephone Encounter (Signed)
Patient

## 2020-09-07 NOTE — Telephone Encounter (Signed)
Patient is requesting refill to be sent to [facility]

## 2020-09-07 NOTE — Telephone Encounter (Signed)
Decline. She should have enough meds ordered by Dr. Harrington Challenger

## 2020-09-08 ENCOUNTER — Telehealth (HOSPITAL_COMMUNITY): Payer: Self-pay | Admitting: *Deleted

## 2020-09-08 ENCOUNTER — Other Ambulatory Visit: Payer: Self-pay | Admitting: Internal Medicine

## 2020-09-08 NOTE — Telephone Encounter (Signed)
Patient pharmacy [facility] (mail-order pharmacy) [name] called stating patient called in her refill request late. So they have to process the request and it takes 5-7 business. Per pharmacy they would like to know if provider could please send into patient local pharmacy for at least 5-7 days worth until patient can receive her monthly script.  [phone].  Medication that is being requested is Ativan. Per pharmacy, they have 2 refills and they don't need refills just a short supply to local pharmacy.   Local Pharmacy is [name].

## 2020-09-09 MED ORDER — LORAZEPAM 0.5 MG PO TABS: 0.5000 mg | ORAL_TABLET | Freq: Two times a day (BID) | ORAL | 0 refills | Status: DC

## 2020-09-09 NOTE — Telephone Encounter (Signed)
noted 

## 2020-09-09 NOTE — Addendum Note (Signed)
Addended by: Merian Capron on: 09/09/2020 09:21 AM   Modules accepted: Orders

## 2020-09-10 DIAGNOSIS — J449 Chronic obstructive pulmonary disease, unspecified: Secondary | ICD-10-CM | POA: Diagnosis not present

## 2020-09-10 DIAGNOSIS — I10 Essential (primary) hypertension: Secondary | ICD-10-CM | POA: Diagnosis not present

## 2020-09-14 ENCOUNTER — Other Ambulatory Visit (HOSPITAL_COMMUNITY): Payer: Medicare HMO

## 2020-09-14 DIAGNOSIS — M160 Bilateral primary osteoarthritis of hip: Secondary | ICD-10-CM | POA: Diagnosis not present

## 2020-09-14 DIAGNOSIS — D649 Anemia, unspecified: Secondary | ICD-10-CM | POA: Diagnosis not present

## 2020-09-14 DIAGNOSIS — R2681 Unsteadiness on feet: Secondary | ICD-10-CM | POA: Diagnosis not present

## 2020-09-14 DIAGNOSIS — E1142 Type 2 diabetes mellitus with diabetic polyneuropathy: Secondary | ICD-10-CM | POA: Diagnosis not present

## 2020-09-14 DIAGNOSIS — F250 Schizoaffective disorder, bipolar type: Secondary | ICD-10-CM | POA: Diagnosis not present

## 2020-09-14 DIAGNOSIS — J42 Unspecified chronic bronchitis: Secondary | ICD-10-CM | POA: Diagnosis not present

## 2020-09-14 DIAGNOSIS — J449 Chronic obstructive pulmonary disease, unspecified: Secondary | ICD-10-CM | POA: Diagnosis not present

## 2020-09-14 DIAGNOSIS — F17210 Nicotine dependence, cigarettes, uncomplicated: Secondary | ICD-10-CM | POA: Diagnosis not present

## 2020-09-14 DIAGNOSIS — E039 Hypothyroidism, unspecified: Secondary | ICD-10-CM | POA: Diagnosis not present

## 2020-09-15 ENCOUNTER — Other Ambulatory Visit: Payer: Self-pay

## 2020-09-15 ENCOUNTER — Inpatient Hospital Stay (HOSPITAL_COMMUNITY): Payer: Medicare HMO | Attending: Hematology

## 2020-09-15 DIAGNOSIS — I10 Essential (primary) hypertension: Secondary | ICD-10-CM | POA: Insufficient documentation

## 2020-09-15 DIAGNOSIS — Z79899 Other long term (current) drug therapy: Secondary | ICD-10-CM | POA: Diagnosis not present

## 2020-09-15 DIAGNOSIS — M797 Fibromyalgia: Secondary | ICD-10-CM | POA: Insufficient documentation

## 2020-09-15 DIAGNOSIS — E119 Type 2 diabetes mellitus without complications: Secondary | ICD-10-CM | POA: Diagnosis not present

## 2020-09-15 DIAGNOSIS — R5383 Other fatigue: Secondary | ICD-10-CM | POA: Insufficient documentation

## 2020-09-15 DIAGNOSIS — Z87891 Personal history of nicotine dependence: Secondary | ICD-10-CM | POA: Insufficient documentation

## 2020-09-15 DIAGNOSIS — Z800 Family history of malignant neoplasm of digestive organs: Secondary | ICD-10-CM | POA: Diagnosis not present

## 2020-09-15 DIAGNOSIS — K449 Diaphragmatic hernia without obstruction or gangrene: Secondary | ICD-10-CM | POA: Insufficient documentation

## 2020-09-15 DIAGNOSIS — Z7982 Long term (current) use of aspirin: Secondary | ICD-10-CM | POA: Insufficient documentation

## 2020-09-15 DIAGNOSIS — Z86010 Personal history of colonic polyps: Secondary | ICD-10-CM | POA: Insufficient documentation

## 2020-09-15 DIAGNOSIS — D509 Iron deficiency anemia, unspecified: Secondary | ICD-10-CM | POA: Insufficient documentation

## 2020-09-15 DIAGNOSIS — E039 Hypothyroidism, unspecified: Secondary | ICD-10-CM | POA: Insufficient documentation

## 2020-09-15 DIAGNOSIS — E785 Hyperlipidemia, unspecified: Secondary | ICD-10-CM | POA: Diagnosis not present

## 2020-09-15 DIAGNOSIS — J449 Chronic obstructive pulmonary disease, unspecified: Secondary | ICD-10-CM | POA: Insufficient documentation

## 2020-09-15 DIAGNOSIS — M129 Arthropathy, unspecified: Secondary | ICD-10-CM | POA: Diagnosis not present

## 2020-09-15 DIAGNOSIS — K5790 Diverticulosis of intestine, part unspecified, without perforation or abscess without bleeding: Secondary | ICD-10-CM | POA: Diagnosis not present

## 2020-09-15 DIAGNOSIS — K219 Gastro-esophageal reflux disease without esophagitis: Secondary | ICD-10-CM | POA: Insufficient documentation

## 2020-09-15 LAB — CBC WITH DIFFERENTIAL/PLATELET
Abs Immature Granulocytes: 0.05 10*3/uL (ref 0.00–0.07)
Basophils Absolute: 0 10*3/uL (ref 0.0–0.1)
Basophils Relative: 1 %
Eosinophils Absolute: 0.2 10*3/uL (ref 0.0–0.5)
Eosinophils Relative: 4 %
HCT: 36.5 % (ref 36.0–46.0)
Hemoglobin: 10.6 g/dL — ABNORMAL LOW (ref 12.0–15.0)
Immature Granulocytes: 1 %
Lymphocytes Relative: 23 %
Lymphs Abs: 1.4 10*3/uL (ref 0.7–4.0)
MCH: 22.8 pg — ABNORMAL LOW (ref 26.0–34.0)
MCHC: 29 g/dL — ABNORMAL LOW (ref 30.0–36.0)
MCV: 78.5 fL — ABNORMAL LOW (ref 80.0–100.0)
Monocytes Absolute: 0.7 10*3/uL (ref 0.1–1.0)
Monocytes Relative: 11 %
Neutro Abs: 3.8 10*3/uL (ref 1.7–7.7)
Neutrophils Relative %: 60 %
Platelets: 216 10*3/uL (ref 150–400)
RBC: 4.65 MIL/uL (ref 3.87–5.11)
RDW: 14.1 % (ref 11.5–15.5)
WBC: 6.2 10*3/uL (ref 4.0–10.5)
nRBC: 0 % (ref 0.0–0.2)

## 2020-09-15 LAB — IRON AND TIBC
Iron: 65 ug/dL (ref 28–170)
Saturation Ratios: 20 % (ref 10.4–31.8)
TIBC: 330 ug/dL (ref 250–450)
UIBC: 265 ug/dL

## 2020-09-15 LAB — FERRITIN: Ferritin: 642 ng/mL — ABNORMAL HIGH (ref 11–307)

## 2020-09-20 ENCOUNTER — Ambulatory Visit (HOSPITAL_COMMUNITY): Payer: Medicare HMO | Admitting: Psychiatry

## 2020-09-20 ENCOUNTER — Other Ambulatory Visit: Payer: Self-pay

## 2020-09-20 DIAGNOSIS — J449 Chronic obstructive pulmonary disease, unspecified: Secondary | ICD-10-CM | POA: Diagnosis not present

## 2020-09-20 DIAGNOSIS — E039 Hypothyroidism, unspecified: Secondary | ICD-10-CM | POA: Diagnosis not present

## 2020-09-20 DIAGNOSIS — M160 Bilateral primary osteoarthritis of hip: Secondary | ICD-10-CM | POA: Diagnosis not present

## 2020-09-20 DIAGNOSIS — E1142 Type 2 diabetes mellitus with diabetic polyneuropathy: Secondary | ICD-10-CM | POA: Diagnosis not present

## 2020-09-20 DIAGNOSIS — D649 Anemia, unspecified: Secondary | ICD-10-CM | POA: Diagnosis not present

## 2020-09-20 DIAGNOSIS — J42 Unspecified chronic bronchitis: Secondary | ICD-10-CM | POA: Diagnosis not present

## 2020-09-20 DIAGNOSIS — R2681 Unsteadiness on feet: Secondary | ICD-10-CM | POA: Diagnosis not present

## 2020-09-20 DIAGNOSIS — F250 Schizoaffective disorder, bipolar type: Secondary | ICD-10-CM | POA: Diagnosis not present

## 2020-09-20 DIAGNOSIS — F17210 Nicotine dependence, cigarettes, uncomplicated: Secondary | ICD-10-CM | POA: Diagnosis not present

## 2020-09-21 ENCOUNTER — Ambulatory Visit (HOSPITAL_COMMUNITY): Payer: Medicare HMO | Admitting: Hematology

## 2020-09-21 ENCOUNTER — Telehealth: Payer: Self-pay | Admitting: Family Medicine

## 2020-09-21 DIAGNOSIS — E039 Hypothyroidism, unspecified: Secondary | ICD-10-CM | POA: Diagnosis not present

## 2020-09-21 DIAGNOSIS — F250 Schizoaffective disorder, bipolar type: Secondary | ICD-10-CM | POA: Diagnosis not present

## 2020-09-21 DIAGNOSIS — J42 Unspecified chronic bronchitis: Secondary | ICD-10-CM | POA: Diagnosis not present

## 2020-09-21 DIAGNOSIS — D649 Anemia, unspecified: Secondary | ICD-10-CM | POA: Diagnosis not present

## 2020-09-21 DIAGNOSIS — E1142 Type 2 diabetes mellitus with diabetic polyneuropathy: Secondary | ICD-10-CM | POA: Diagnosis not present

## 2020-09-21 DIAGNOSIS — J449 Chronic obstructive pulmonary disease, unspecified: Secondary | ICD-10-CM | POA: Diagnosis not present

## 2020-09-21 DIAGNOSIS — F17210 Nicotine dependence, cigarettes, uncomplicated: Secondary | ICD-10-CM | POA: Diagnosis not present

## 2020-09-21 DIAGNOSIS — R2681 Unsteadiness on feet: Secondary | ICD-10-CM | POA: Diagnosis not present

## 2020-09-21 DIAGNOSIS — M160 Bilateral primary osteoarthritis of hip: Secondary | ICD-10-CM | POA: Diagnosis not present

## 2020-09-21 NOTE — Telephone Encounter (Signed)
Please call the pt regarding [facility] she received in the mail .

## 2020-09-21 NOTE — Progress Notes (Signed)
[place], [place] [address]   CLINIC:  Medical Oncology/Hematology  PCP:  [name], MD [address] [place] [address] [phone]   REASON FOR VISIT:  Follow-up for microcytic anemia  PRIOR THERAPY: Iron tablets  CURRENT THERAPY: Intermittent Feraheme (last on 02/19/2020)  INTERVAL HISTORY:  [name] 70 y.o. female returns for routine follow-up of her microcytic iron deficiency anemia.  She was last seen by Dr. Delton Coombes on 05/11/2020.  At today's visit, she reports feeling fair.  No recent hospitalizations, surgeries, or changes in baseline health status.  She denies any current signs or symptoms of bleeding.  No epistaxis, hematemesis, hemoptysis, hematochezia, or melena. She has persistent fatigue as well as dyspnea on exertion, lightheadedness, and headaches.  She denies chest pain, palpitations, syncopal episodes. No B symptoms.  She has 40% energy and 100% appetite. She endorses that she is maintaining a stable weight.    REVIEW OF SYSTEMS:  Review of Systems  Constitutional:  Positive for fatigue. Negative for appetite change, chills, diaphoresis, fever and unexpected weight change.  HENT:   Negative for lump/mass and nosebleeds.   Eyes:  Negative for eye problems.  Respiratory:  Positive for shortness of breath (with exertion). Negative for cough and hemoptysis.   Cardiovascular:  Negative for chest pain, leg swelling and palpitations.  Gastrointestinal:  Negative for abdominal pain, blood in stool, constipation, diarrhea, nausea and vomiting.  Genitourinary:  Negative for hematuria.   Musculoskeletal:  Positive for arthralgias and back pain.  Skin: Negative.   Neurological:  Positive for dizziness, headaches, light-headedness and numbness (chronic tingling in right hand).  Hematological:  Does not bruise/bleed easily.  Psychiatric/Behavioral:  Positive for depression.      PAST MEDICAL/SURGICAL  HISTORY:  Past Medical History:  Diagnosis Date   Allergy    Anemia    Anxiety    takes Ativan daily   Arthritis    Assistance needed for mobility    Bipolar disorder ([place])    takes Risperdal nightly   Blood transfusion    Brain tumor ([place])    Cancer ([place])    In her gum   Carpal tunnel syndrome of right wrist 05/23/2011   Cervical disc disorder with radiculopathy of cervical region 10/31/2012   Chronic back pain    Chronic idiopathic constipation    Chronic neck and back pain    Colon polyps    COPD (chronic obstructive pulmonary disease) with chronic bronchitis ([place]) 09/16/2013   Office Spirometry 10/30/2013-submaximal effort based on appearance of loop and curve. Numbers would fit with severe restriction but her physiologic capability may be better than this. FVC 0.91/44%, and 10.74/45%, FEV1/FVC 0.81, FEF 25-75% 1.43/69%     Diabetes mellitus    Type II   Diverticulosis    TCS 9/08 by Dr. Delfin Edis for diarrhea . Bx for micro scopic colitis negative.    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   Metabolic encephalopathy 7/61/9509   Migraines    chronic headaches   Mononeuritis lower limb    Narcolepsy    Osteoporosis    Pancreatitis 2006   due to Depakote with normal EUS    Schatzki's ring    non critical / EGD with ED 8/2011with RMR   Seizures ([place])    takes Lamictal daily.Last seizure 3 yrs ago   Sleep apnea    on CPAP   Small bowel  obstruction (H[facility])    Stroke ([place])    left sided weakness, speech changes   Tubular adenoma of colon    Past Surgical History:  Procedure Laterality Date   ABDOMINAL HYSTERECTOMY  1978   BACK SURGERY  July 2012   BACTERIAL OVERGROWTH TEST N/A 05/05/2013   Procedure: BACTERIAL OVERGROWTH TEST;  Surgeon: Daneil Dolin, MD;  [facility];  Service: Endoscopy;  Laterality: N/A;  7:30   BIOPSY THYROID  2009   BRAIN SURGERY  11/2011   resection of meningioma   BREAST REDUCTION SURGERY  1994   CARDIAC CATHETERIZATION  05/10/2005   normal coronaries, normal LV systolic function and EF (Dr. Jackie Plum)   [place] Left 07/22/04   Dr. Aline Brochure   CATARACT EXTRACTION Bilateral    CHOLECYSTECTOMY  1984   COLONOSCOPY N/A 09/25/2012   [id number] diverticulosis.  colonic polyp-removed : tubular adenoma   CRANIOTOMY  11/23/2011   Procedure: CRANIOTOMY TUMOR EXCISION;  Surgeon: Hosie Spangle, MD;  Location: [facility];  Service: Neurosurgery;  Laterality: N/A;  Craniotomy for tumor resection   ESOPHAGOGASTRODUODENOSCOPY  12/29/2010   [place]-Retained food in the esophagus and stomach, small hiatal hernia, status post Maloney dilation of the esophagus   ESOPHAGOGASTRODUODENOSCOPY N/A 09/25/2012   DXI:[id number] atonic baggy esophagus status post Maloney dilation 40 F. Hiatal hernia   GIVENS CAPSULE STUDY N/A 01/15/2013   NORMAL.    IR GENERIC HISTORICAL  03/17/2016   IR RADIOLOGIST EVAL & MGMT 03/17/2016 [facility]   LESION REMOVAL N/A 05/31/2015   Procedure: REMOVAL RIGHT AND LEFT LESIONS OF MANDIBLE;  Surgeon: Diona Browner, DDS;  Location: [facility];  Service: Oral Surgery;  Laterality: N/A;   MALONEY DILATION  12/29/2010   RMR;   NM MYOCAR PERF WALL MOTION  2006   "relavtiely normal" persantine, mild anterior thinning (breast attenuation artifact), no region of scar/ischemia   OVARIAN CYST REMOVAL     RECTOCELE REPAIR N/A 06/29/2015   Procedure: POSTERIOR REPAIR (RECTOCELE);   Surgeon: Jonnie Kind, MD;  Location: [facility];  Service: Gynecology;  Laterality: N/A;   REDUCTION MAMMAPLASTY Bilateral    SPINE SURGERY  09/29/2010   Dr. Rolena Infante   surgical excision of 3 tumors from right thigh and right buttock  and left upper thigh  2010   TOOTH EXTRACTION Bilateral 12/14/2014   Procedure: REMOVAL OF BILATERAL MANDIBULAR EXOSTOSES;  Surgeon: Diona Browner, DDS;  Location: [facility];  Service: Oral Surgery;  Laterality: Bilateral;   TRANSTHORACIC ECHOCARDIOGRAM  2010   EF 60-65%, mild conc LVH, grade 1 diastolic dysfunction; mildly calcified MV annulus with mildly thickened leaflets, mildly calcified MR annulus     SOCIAL HISTORY:  Social History   Socioeconomic History   Marital status: Divorced    Spouse name: Not on file   Number of children: 1   Years of education: 12   Highest education level: High school graduate  Occupational History   Occupation: Disabled  Tobacco Use   Smoking status: Former    Packs/day: 0.50    Years: 30.00    Pack years: 15.00    Types: Cigarettes    Quit date: 06/11/2020    Years since quitting: 0.2   Smokeless tobacco: Never   Tobacco comments:    quit 7 weeks ago-07/14/2020-AH   Vaping Use   Vaping Use: Never used  Substance and Sexual Activity   Alcohol use: No    Alcohol/week: 0.0 standard drinks    Comment:     Drug use: No   Sexual activity: Not Currently  Other Topics Concern   Not on file  Social History Narrative   01/29/18 Lives alone, has 3 aides, Mon- Fri 8 hrs, 2 hrs on Sat-Sun, RN manages her meds   Caffeine use: Drink coffee sometimes    Right handed    Social Determinants of Radio broadcast assistant Strain: Low Risk    Difficulty of Paying Living Expenses: Not very hard  Food Insecurity: No Food Insecurity   Worried About Charity fundraiser in the Last Year: Never true   [place] in the Last Year: Never true  Transportation Needs: No Transportation Needs   Lack of Transportation (Medical):  No   Lack of Transportation (Non-Medical): No  Physical Activity: Inactive   Days of Exercise per Week: 0 days   Minutes of Exercise per Session: 0 min  Stress: Not on file  Social Connections: Moderately Isolated   Frequency of Communication with Friends and Family: Three times a week   Frequency of Social Gatherings with Friends and Family: Twice a week   Attends Religious Services: More than 4 times per year   Active Member of [employer] or Organizations: No   Attends Music therapist: Never   Marital Status: Divorced  Human resources officer Violence: Not on file    FAMILY HISTORY:  Family History  Problem Relation Age of Onset   Heart attack Mother        HTN   Pneumonia Father    Kidney failure Father    Diabetes Father    Pancreatic cancer Sister    Diabetes Brother    Hypertension Brother    Diabetes Brother    Cancer Sister        breast    Hypertension Son    Sleep apnea Son    Cancer Sister        pancreatic   Stroke Maternal Grandmother    Heart attack Maternal Grandfather    Alcohol abuse Maternal Uncle    Colon cancer Neg Hx    Anesthesia problems Neg Hx    Hypotension Neg Hx    Malignant hyperthermia Neg Hx    Pseudochol deficiency Neg Hx    Breast cancer Neg Hx     CURRENT MEDICATIONS:  Outpatient Encounter Medications as of 09/22/2020  Medication Sig   Accu-Chek Softclix Lancets lancets TEST BLOOD SUGAR THREE TIMES DAILY AS DIRECTED   albuterol (VENTOLIN HFA) 108 (90 Base) MCG/ACT inhaler INHALE 1 TO 2 PUFFS EVERY 6 HOURS AS NEEDED FOR WHEEZING, SHORTNESS OF BREATH   Alcohol Swabs (B-D SINGLE USE SWABS REGULAR) PADS USE TO CLEAN FINGER PRIOR TO STICKING FOR BLOOD SUGAR.   alendronate (FOSAMAX) 70 MG tablet TAKE 1 TABLET (70 MG TOTAL) BY MOUTH EVERY 7 (SEVEN) DAYS. TAKE WITH A FULL GLASS OF WATER ON AN EMPTY STOMACH.   amLODipine (NORVASC) 10 MG  tablet TAKE 1 TABLET EVERY DAY   ascorbic acid (VITAMIN C) 500 MG tablet Take 500 mg by mouth daily.    aspirin EC 81 MG tablet Take 1 tablet (81 mg total) by mouth daily with breakfast.   betamethasone dipropionate 0.05 % cream Apply topically 2 (two) times daily as needed (Rash).   Blood Glucose Calibration (ACCU-CHEK GUIDE CONTROL) LIQD USE PRN TO CALIBRATE GLUCOMETER   blood glucose meter kit and supplies Dispense based on patient and insurance preference. Use up to four times daily as directed. (FOR ICD-10 E10.9, E11.9).   buPROPion (WELLBUTRIN XL) 150 MG 24 hr tablet Take 1 tablet (150 mg total) by mouth every morning.   cetirizine (ZYRTEC) 10 MG tablet Take 1 tablet (10 mg total) by mouth daily.   clobetasol cream (TEMOVATE) 0.03 % Apply 1 application topically 2 (two) times daily.    Continuous Blood Gluc Sensor (FREESTYLE LIBRE 14 DAY SENSOR) MISC 1 each by Does not apply route every 14 (fourteen) days. Change every 2 weeks   diclofenac Sodium (VOLTAREN) 1 % GEL APPLY 2 GRAMS  TOPICALLY 4 (FOUR) TIMES DAILY.   ezetimibe (ZETIA) 10 MG tablet Take 1 tablet (10 mg total) by mouth daily.   glucose blood (ACCU-CHEK GUIDE) test strip USE TO CHECK BLOOD SUGAR FOUR TIMES A DAY AND PRN   hydrOXYzine (ATARAX/VISTARIL) 10 MG tablet Take 1 tablet (10 mg total) by mouth 3 (three) times daily as needed.   insulin aspart (NOVOLOG FLEXPEN) 100 UNIT/ML FlexPen Inject 16 to 18 units under skin before meals up to 3 times a day   Insulin Pen Needle (SURE COMFORT PEN NEEDLES) 31G X 8 MM MISC USE TO for injecting insulin 4 TIMES DAILY.   lamoTRIgine (LAMICTAL) 100 MG tablet Take 1 tablet (100 mg total) by mouth 2 (two) times daily.   levothyroxine (SYNTHROID) 50 MCG tablet Take 1 tablet (50 mcg) 6 days 1/2 tablet (25 mcg) 1 day weekly.   LORazepam (ATIVAN) 0.5 MG tablet Take 1 tablet (0.5 mg total) by mouth 2 (two) times daily. She has mail in refills, this is to cover for next 5-7 days   losartan (COZAAR) 50 MG tablet Take 1 tablet (50 mg total) by mouth daily.   meclizine (ANTIVERT) 25 MG tablet Take 1 tablet  (25 mg total) by mouth 3 (three) times daily as needed for dizziness.   metoprolol tartrate (LOPRESSOR) 50 MG tablet TAKE 1 TABLET TWICE DAILY (NEED MD APPOINTMENT)   montelukast (SINGULAIR) 10 MG tablet Take 1 tablet (10 mg total) by mouth daily.   nystatin (MYCOSTATIN/NYSTOP) powder APPLY TO AFFECTED AREA 4 TIMES DAILY.   Omega-3 Fatty Acids (FISH OIL PO) Take 1 capsule by mouth daily.   oxyCODONE (OXY IR/ROXICODONE) 5 MG immediate release tablet Take 5 mg by mouth every 6 (six) hours as needed.   Plecanatide (TRULANCE) 3 MG TABS Take 3 mg by mouth daily as needed.   pregabalin (LYRICA) 75 MG capsule Take 1 capsule (75 mg total) by mouth daily.   RABEprazole (ACIPHEX) 20 MG tablet TAKE 1 TABLET TWICE DAILY (MUST KEEP 07/2020 APPT FOR FURTHER REFILLS)   risperiDONE (RISPERDAL) 0.5 MG tablet Take 1 tablet (0.5 mg total) by mouth at bedtime.   rosuvastatin (CRESTOR) 5 MG tablet TAKE 1 TABLET AT BEDTIME   sertraline (ZOLOFT) 100 MG tablet Take 1 tablet (100 mg total) by mouth daily.   Sodium Sulfate-Mag Sulfate-KCl (SUTAB) 724 209 8549 MG TABS Use as directed for colonoscopy. MANUFACTURER CODESKara Dies: [id number] PCN:  CN GROUP: [id number] MEMBER ID: [id number];ATF AS SECONDARY INSURANCE ;NO PRIOR AUTHORIZATION   STIOLTO RESPIMAT 2.5-2.5 MCG/ACT AERS INHALE 2 PUFFS INTO THE LUNGS DAILY.   TOUJEO MAX SOLOSTAR 300 UNIT/ML Solostar Pen INJECT 24 UNITS SUBCUTANEOUSLY INTO THE SKIN AT BEDTIME   traZODone (DESYREL) 100 MG tablet Take 1 tablet (100 mg total) by mouth at bedtime.   triamcinolone (KENALOG) 0.1 % Apply 1 application topically daily.   UNABLE TO [facility] bed mattress x 1  DX: G47.33, J44.9   UNABLE TO FIND Standard wheelchair Dx M47.16, R29.898   No facility-administered encounter medications on file as of 09/22/2020.    ALLERGIES:  Allergies  Allergen Reactions   Lac Bovis Rash    Doesn't agree with stomach.    Phenazopyridine Hcl Hives   Cephalexin Hives   Flonase [Fluticasone]      "It gave me ulcers in my nose"   Iron Nausea And Vomiting   Milk-Related Compounds Other (See Comments)    Doesn't agree with stomach.    Penicillins Hives    Has patient had a PCN reaction causing immediate rash, facial/tongue/throat swelling, SOB or lightheadedness with hypotension: Yes Has patient had a PCN reaction causing severe rash involving mucus membranes or skin necrosis: No Has patient had a PCN reaction that required hospitalization No Has patient had a PCN reaction occurring within the last 10 years: No If all of the above answers are "NO", then may proceed with Cephalosporin use.    Phenazopyridine Hcl Hives           PHYSICAL EXAM:  ECOG PERFORMANCE STATUS: 2 - Symptomatic, <50% confined to bed  There were no vitals filed for this visit. There were no vitals filed for this visit. Physical Exam Constitutional:      Appearance: Normal appearance. She is obese.  HENT:     Head: Normocephalic and atraumatic.     Mouth/Throat:     Mouth: Mucous membranes are moist.  Eyes:     Extraocular Movements: Extraocular movements intact.     Pupils: Pupils are equal, round, and reactive to light.  Cardiovascular:     Rate and Rhythm: Normal rate and regular rhythm.     Pulses: Normal pulses.     Heart sounds: Normal heart sounds.  Pulmonary:     Effort: Pulmonary effort is normal.     Breath sounds: Decreased breath sounds present.  Abdominal:     General: Bowel sounds are normal.     Palpations: Abdomen is soft.     Tenderness: There is no abdominal tenderness.  Musculoskeletal:        General: No swelling.     Right lower leg: No edema.     Left lower leg: No edema.  Lymphadenopathy:     Cervical: No cervical adenopathy.  Skin:    General: Skin is warm and dry.  Neurological:     General: No focal deficit present.     Mental Status: She is alert and oriented to person, place, and time.  Psychiatric:        Mood and Affect: Mood normal.        Behavior:  Behavior normal.     LABORATORY DATA:  I have reviewed the labs as listed.  CBC    Component Value Date/Time   WBC 6.2 09/15/2020 1133   RBC 4.65 09/15/2020 1133   HGB 10.6 (L) 09/15/2020 1133   HGB 10.3 (L) 01/29/2018 1315   HCT 36.5 09/15/2020 1133   HCT  33.8 (L) 01/29/2018 1315   PLT 216 09/15/2020 1133   PLT 275 01/29/2018 1315   MCV 78.5 (L) 09/15/2020 1133   MCV 75 (L) 01/29/2018 1315   MCH 22.8 (L) 09/15/2020 1133   MCHC 29.0 (L) 09/15/2020 1133   RDW 14.1 09/15/2020 1133   RDW 12.8 01/29/2018 1315   LYMPHSABS 1.4 09/15/2020 1133   LYMPHSABS 2.4 01/29/2018 1315   MONOABS 0.7 09/15/2020 1133   EOSABS 0.2 09/15/2020 1133   EOSABS 0.2 01/29/2018 1315   BASOSABS 0.0 09/15/2020 1133   BASOSABS 0.0 01/29/2018 1315   CMP Latest Ref Rng & Units 09/01/2020 05/04/2020 02/20/2020  Glucose 65 - 99 mg/dL 93 90 108(H)  BUN 8 - 27 mg/dL 26 28(H) 32(H)  Creatinine 0.57 - 1.00 mg/dL 1.05(H) 1.13(H) 1.50(H)  Sodium 134 - 144 mmol/L 144 139 140  Potassium 3.5 - 5.2 mmol/L 4.7 4.3 4.3  Chloride 96 - 106 mmol/L 106 103 108  CO2 20 - 29 mmol/L _0 Calcium 8.7 - 10.3 mg/dL 9.6 9.8 8.6(L)  Total Protein 6.0 - 8.5 g/dL 7.1 7.4 7.0  Total Bilirubin 0.0 - 1.2 mg/dL 0.2 0.5 0.6  Alkaline Phos 44 - 121 IU/L 97 71 88  AST 0 - 40 IU/L _1 ALT 0 - 32 IU/L _2 DIAGNOSTIC IMAGING:  I have independently reviewed the relevant imaging and discussed with the patient.  ASSESSMENT & PLAN: 1.  Microcytic anemia - This is a combination anemia from iron deficiency and chronic kidney disease stage III. - SPEP checked in 2019 was normal. - Colonoscopy on 02/10/2016 showing 2 sessile polyps in the descending colon, diverticula in the sigmoid colon. - EGD done on 02/10/2016 showing mild to moderate esophagitis, erythema in the inferior gastric antrum. - She was placed on iron pills, but could not tolerate them due to constipation. - Last Feraheme on 02/12/2020 on 02/19/2020. - She  denies any bleeding per rectum or melena.   - Reviewed labs from 09/15/2020: Hgb 10.6 with MCV 78.5, ferritin 642, iron saturation 20% - PLAN: No indication for parenteral iron therapy at this time.  RTC 3 months with repeat labs.  We will consider Retacrit in the future if Hgb drops to < 10.0 in the setting of CKD stage III.   PLAN SUMMARY & DISPOSITION: -Labs and RTC in 3 months  All questions were answered. The patient knows to call the clinic with any problems, questions or concerns.  Medical decision making: Low  Time spent on visit: I spent 15 minutes counseling the patient face to face. The total time spent in the appointment was 25 minutes and more than 50% was on counseling.   Harriett Rush, PA-C  09/22/2020 11:56 AM

## 2020-09-22 ENCOUNTER — Inpatient Hospital Stay (HOSPITAL_BASED_OUTPATIENT_CLINIC_OR_DEPARTMENT_OTHER): Payer: Medicare HMO | Admitting: Physician Assistant

## 2020-09-22 ENCOUNTER — Other Ambulatory Visit: Payer: Self-pay

## 2020-09-22 VITALS — BP 161/52 | HR 64 | Temp 96.9°F | Resp 20 | Wt 170.2 lb

## 2020-09-22 DIAGNOSIS — M797 Fibromyalgia: Secondary | ICD-10-CM | POA: Diagnosis not present

## 2020-09-22 DIAGNOSIS — Z7689 Persons encountering health services in other specified circumstances: Secondary | ICD-10-CM | POA: Diagnosis not present

## 2020-09-22 DIAGNOSIS — M129 Arthropathy, unspecified: Secondary | ICD-10-CM | POA: Diagnosis not present

## 2020-09-22 DIAGNOSIS — D509 Iron deficiency anemia, unspecified: Secondary | ICD-10-CM | POA: Diagnosis not present

## 2020-09-22 DIAGNOSIS — E119 Type 2 diabetes mellitus without complications: Secondary | ICD-10-CM | POA: Diagnosis not present

## 2020-09-22 DIAGNOSIS — Z86010 Personal history of colonic polyps: Secondary | ICD-10-CM | POA: Diagnosis not present

## 2020-09-22 DIAGNOSIS — R5383 Other fatigue: Secondary | ICD-10-CM | POA: Diagnosis not present

## 2020-09-22 DIAGNOSIS — K219 Gastro-esophageal reflux disease without esophagitis: Secondary | ICD-10-CM | POA: Diagnosis not present

## 2020-09-22 DIAGNOSIS — J449 Chronic obstructive pulmonary disease, unspecified: Secondary | ICD-10-CM | POA: Diagnosis not present

## 2020-09-22 DIAGNOSIS — K5790 Diverticulosis of intestine, part unspecified, without perforation or abscess without bleeding: Secondary | ICD-10-CM | POA: Diagnosis not present

## 2020-09-22 DIAGNOSIS — D500 Iron deficiency anemia secondary to blood loss (chronic): Secondary | ICD-10-CM

## 2020-09-22 NOTE — Patient Instructions (Signed)
[place] at [facility] Discharge Instructions  You were seen today by Tarri Abernethy PA-C for your anemia.  Your blood and iron levels are stable, so we will see you back in 3 months for repeat labs and follow-up.    LABS: Return in 3 months for labs   FOLLOW-UP APPOINTMENT: Office visit in 3 months   Thank you for choosing [place] at [facility] to provide your oncology and hematology care.  To afford each patient quality time with our provider, please arrive at least 15 minutes before your scheduled appointment time.   If you have a lab appointment with the [facility] please come in thru the Main Entrance and check in at the main information desk.  You need to re-schedule your appointment should you arrive 10 or more minutes late.  We strive to give you quality time with our providers, and arriving late affects you and other patients whose appointments are after yours.  Also, if you no show three or more times for appointments you may be dismissed from the clinic at the providers discretion.     Again, thank you for choosing [facility].  Our hope is that these requests will decrease the amount of time that you wait before being seen by our physicians.       _____________________________________________________________  Should you have questions after your visit to [facility], please contact our office at [phone] and follow the prompts.  Our office hours are 8:00 a.m. and 4:30 p.m. Monday - Friday.  Please note that voicemails left after 4:00 p.m. may not be returned until the following business day.  We are closed weekends and major holidays.  You do have access to a nurse 24-7, just call the main number to the clinic [phone] and do not press any options, hold on the line and a nurse will answer the phone.    For prescription refill requests, have your pharmacy contact our office and allow 72  hours.    Due to Covid, you will need to wear a mask upon entering the hospital. If you do not have a mask, a mask will be given to you at the Main Entrance upon arrival. For doctor visits, patients may have 1 support person age 60 or older with them. For treatment visits, patients can not have anyone with them due to social distancing guidelines and our immunocompromised population.

## 2020-09-24 ENCOUNTER — Other Ambulatory Visit: Payer: Self-pay | Admitting: Family Medicine

## 2020-09-24 ENCOUNTER — Other Ambulatory Visit: Payer: Self-pay

## 2020-09-24 ENCOUNTER — Ambulatory Visit (INDEPENDENT_AMBULATORY_CARE_PROVIDER_SITE_OTHER): Payer: Medicare HMO | Admitting: Psychiatry

## 2020-09-24 DIAGNOSIS — Z1231 Encounter for screening mammogram for malignant neoplasm of breast: Secondary | ICD-10-CM

## 2020-09-24 DIAGNOSIS — F331 Major depressive disorder, recurrent, moderate: Secondary | ICD-10-CM | POA: Diagnosis not present

## 2020-09-24 NOTE — Progress Notes (Signed)
Virtual Visit via Telephone Note  I connected with [name] on 09/24/20 at 10:10 AM EDT by telephone and verified that I am speaking with the correct person using two identifiers.  Location: Patient: Home Provider:  [place] office    I discussed the limitations, risks, security and privacy concerns of performing an evaluation and management service by telephone and the availability of in person appointments. I also discussed with the patient that there may be a patient responsible charge related to this service. The patient expressed understanding and agreed to proceed.  I provided 50 minutes of non-face-to-face time during this encounter.   [name] Smoker, LCSW              THERAPIST PROGRESS NOTE    Session Time: Friday 09/24/2020 10:10 AM  - 11:00 AM   Participation Level: Active  Behavioral Response:  Less depressed, anxious, talkative, alert   Type of Therapy: Individual Therapy  Treatment Goals addressed: Patient wants to learn how to improve coping skills to manage chronic pain and health issues/ improve mood    Interventions: CBT and Supportive             Summary: [name] is a 70 y.o. female who presents with  long standing history of recurrent periods  of depression beginning when she was [age] and her favorite uncle died. Patient reports multiple psychiatric hospitlaizations due to depression and suicidal ideations with the last one occuring in 1997. Patient has participated in outpatient psychotherapy and medication management intermittently since age 48.  She currently is seeing psychiatrist Dr. Harrington [name] . Prior to this, she was seen at [facility]. Patient also has had ECT at [facility]. Symptoms have worsened in recent months due to family stress and have  included depressed mood, anxiety, excessive worry, and tearfulness.         Patient's last contact was by virtual visit via telephone about 2-3  weeks ago.  She reports continued improved mood,  decreased irritability, and behavioral activation since last session.  She reports continuing to attend church, read, and watch various television programs.  She is very pleased about her use of assertiveness skills in a recent conversation with her pastor.  She expressed her concerns and he had a positive response.  Per patient's report, he was able to provide her with some materials that now enrich her church experience as well as help her grow spiritually.  She continues to experience periods of depressed mood but reports managing better.  She is able to identify pain and interpersonal issues as being triggers.  She continues to express frustration about some of her relationships but expresses less anger.  She also is pleased she has made some progress in addressing legal and financial affairs regarding her niece being her power of attorney.to avoid escalating arguments with knees rather than responding the way she would have in the past.  Patient is pleased she is going to see a Education officer, museum regarding her legal and financial affairs.    Suicidal/Homicidal: Nowithout intent/plan      Therapist Response: Reviewed symptoms, praised and reinforced patient's continued efforts to maintain involvement in activity and using helpful coping strategies to manage pain, praised and reinforced patient's use of assertiveness skills, discussed effects, discussed stressors, facilitated expression of thoughts and feelings, validated feelings, reviewed patient's progress in treatment and treatment plan, obtained patient's verbal consent to initial plan for patient as this was a virtual visit, discussed neck steps for treatment  to include learning and implementing relapse prevention strategies, began to assist patient identify lapse versus relapse, will send patient handout early warning signs of depression in preparation for next session . Plan: Return again in 2 weeks.  Diagnosis: Axis I: MDD, Recurrent,  moderate    Axis II: Deferred    [name] Clare E Zoua Caporaso, LCSW

## 2020-09-25 DIAGNOSIS — M4716 Other spondylosis with myelopathy, lumbar region: Secondary | ICD-10-CM | POA: Diagnosis not present

## 2020-09-25 DIAGNOSIS — R29898 Other symptoms and signs involving the musculoskeletal system: Secondary | ICD-10-CM | POA: Diagnosis not present

## 2020-09-27 DIAGNOSIS — S8264XD Nondisplaced fracture of lateral malleolus of right fibula, subsequent encounter for closed fracture with routine healing: Secondary | ICD-10-CM | POA: Diagnosis not present

## 2020-09-30 ENCOUNTER — Telehealth (HOSPITAL_COMMUNITY): Payer: Medicare HMO | Admitting: Psychiatry

## 2020-09-30 DIAGNOSIS — F250 Schizoaffective disorder, bipolar type: Secondary | ICD-10-CM | POA: Diagnosis not present

## 2020-09-30 DIAGNOSIS — M160 Bilateral primary osteoarthritis of hip: Secondary | ICD-10-CM | POA: Diagnosis not present

## 2020-09-30 DIAGNOSIS — E039 Hypothyroidism, unspecified: Secondary | ICD-10-CM | POA: Diagnosis not present

## 2020-09-30 DIAGNOSIS — E1142 Type 2 diabetes mellitus with diabetic polyneuropathy: Secondary | ICD-10-CM | POA: Diagnosis not present

## 2020-09-30 DIAGNOSIS — D649 Anemia, unspecified: Secondary | ICD-10-CM | POA: Diagnosis not present

## 2020-09-30 DIAGNOSIS — R2681 Unsteadiness on feet: Secondary | ICD-10-CM | POA: Diagnosis not present

## 2020-09-30 DIAGNOSIS — J42 Unspecified chronic bronchitis: Secondary | ICD-10-CM | POA: Diagnosis not present

## 2020-09-30 DIAGNOSIS — J449 Chronic obstructive pulmonary disease, unspecified: Secondary | ICD-10-CM | POA: Diagnosis not present

## 2020-09-30 DIAGNOSIS — F17210 Nicotine dependence, cigarettes, uncomplicated: Secondary | ICD-10-CM | POA: Diagnosis not present

## 2020-09-30 NOTE — Telephone Encounter (Signed)
Not familiar with this. Will need to bring to the office

## 2020-10-04 ENCOUNTER — Telehealth (HOSPITAL_COMMUNITY): Payer: Medicare HMO | Admitting: Psychiatry

## 2020-10-04 DIAGNOSIS — F17210 Nicotine dependence, cigarettes, uncomplicated: Secondary | ICD-10-CM | POA: Diagnosis not present

## 2020-10-04 DIAGNOSIS — F250 Schizoaffective disorder, bipolar type: Secondary | ICD-10-CM | POA: Diagnosis not present

## 2020-10-04 DIAGNOSIS — R2681 Unsteadiness on feet: Secondary | ICD-10-CM | POA: Diagnosis not present

## 2020-10-04 DIAGNOSIS — E039 Hypothyroidism, unspecified: Secondary | ICD-10-CM | POA: Diagnosis not present

## 2020-10-04 DIAGNOSIS — J449 Chronic obstructive pulmonary disease, unspecified: Secondary | ICD-10-CM | POA: Diagnosis not present

## 2020-10-04 DIAGNOSIS — E1142 Type 2 diabetes mellitus with diabetic polyneuropathy: Secondary | ICD-10-CM | POA: Diagnosis not present

## 2020-10-04 DIAGNOSIS — D649 Anemia, unspecified: Secondary | ICD-10-CM | POA: Diagnosis not present

## 2020-10-04 DIAGNOSIS — M160 Bilateral primary osteoarthritis of hip: Secondary | ICD-10-CM | POA: Diagnosis not present

## 2020-10-04 DIAGNOSIS — J42 Unspecified chronic bronchitis: Secondary | ICD-10-CM | POA: Diagnosis not present

## 2020-10-05 DIAGNOSIS — F17210 Nicotine dependence, cigarettes, uncomplicated: Secondary | ICD-10-CM | POA: Diagnosis not present

## 2020-10-05 DIAGNOSIS — R2681 Unsteadiness on feet: Secondary | ICD-10-CM | POA: Diagnosis not present

## 2020-10-05 DIAGNOSIS — J42 Unspecified chronic bronchitis: Secondary | ICD-10-CM | POA: Diagnosis not present

## 2020-10-05 DIAGNOSIS — M160 Bilateral primary osteoarthritis of hip: Secondary | ICD-10-CM | POA: Diagnosis not present

## 2020-10-05 DIAGNOSIS — J449 Chronic obstructive pulmonary disease, unspecified: Secondary | ICD-10-CM | POA: Diagnosis not present

## 2020-10-05 DIAGNOSIS — E1142 Type 2 diabetes mellitus with diabetic polyneuropathy: Secondary | ICD-10-CM | POA: Diagnosis not present

## 2020-10-05 DIAGNOSIS — F250 Schizoaffective disorder, bipolar type: Secondary | ICD-10-CM | POA: Diagnosis not present

## 2020-10-05 DIAGNOSIS — E039 Hypothyroidism, unspecified: Secondary | ICD-10-CM | POA: Diagnosis not present

## 2020-10-05 DIAGNOSIS — D649 Anemia, unspecified: Secondary | ICD-10-CM | POA: Diagnosis not present

## 2020-10-06 ENCOUNTER — Other Ambulatory Visit: Payer: Self-pay

## 2020-10-06 ENCOUNTER — Ambulatory Visit (INDEPENDENT_AMBULATORY_CARE_PROVIDER_SITE_OTHER): Payer: Medicare HMO | Admitting: Psychiatry

## 2020-10-06 DIAGNOSIS — F331 Major depressive disorder, recurrent, moderate: Secondary | ICD-10-CM

## 2020-10-06 NOTE — Progress Notes (Signed)
Virtual Visit via Telephone Note  I connected with [name] on 10/06/20 at 4:15 PM EDT by telephone and verified that I am speaking with the correct person using two identifiers.  Location: Patient: Home Provider: [place] office    I discussed the limitations, risks, security and privacy concerns of performing an evaluation and management service by telephone and the availability of in person appointments. I also discussed with the patient that there may be a patient responsible charge related to this service. The patient expressed understanding and agreed to proceed.   I provided  20mnutes of non-face-to-face time during this encounter.   PAlonza Smoker LCSW             THERAPIST PROGRESS NOTE    Session Time: Wednesday 10/06/2020 4:15 PM - 5:00 PM   Participation Level: Active  Behavioral Response:  Less depressed, anxious, talkative, alert   Type of Therapy: Individual Therapy  Treatment Goals addressed: Patient wants to learn how to improve coping skills to manage chronic pain and health issues/ improve mood    Interventions: CBT and Supportive             Summary: [name] is a 70y.o. female who presents with  long standing history of recurrent periods  of depression beginning when she was [age] and her favorite uncle died. Patient reports multiple psychiatric hospitlaizations due to depression and suicidal ideations with the last one occuring in 1997. Patient has participated in outpatient psychotherapy and medication management intermittently since age 89  She currently is seeing psychiatrist Dr. RHarrington Challenger. [facility] Symptoms have worsened in recent months due to family stress and have  included depressed mood, anxiety, excessive worry, and tearfulness.         Patient's last contact was by virtual visit via telephone about 2-3  weeks ago.  She reports continued improved mood,  decreased irritability, and behavioral activation since last session.  She reports continuing to attend church, read, and watch various television programs.  She continues to use assertiveness skills and cites recent examples with her aide as well as her niece.  Patient reports she did not receive handout discussed in last session in the mail.  Therapist will send patient another handout.  Patient reports mood has been stable she reports having instances of depressed mood but then using distracting activities to manage.    Suicidal/Homicidal: Nowithout intent/plan      Therapist Response: Reviewed symptoms, praised and reinforced patient's continued efforts to maintain involvement in activity, praised and reinforced patient's use of assertiveness skills, assisted patient identify triggers of depressed mood, discussed lapse versus relapse of depression, assisted patient began to identify early warning signs of depression, will send patient handout again in preparation for next session Plan: Return again in 2 weeks.  Diagnosis: Axis I: MDD, Recurrent, moderate    Axis II: Deferred    [name] Rosendahl E [name], LCSW

## 2020-10-07 ENCOUNTER — Encounter (HOSPITAL_COMMUNITY): Payer: Self-pay | Admitting: Internal Medicine

## 2020-10-08 ENCOUNTER — Encounter (HOSPITAL_COMMUNITY): Payer: Self-pay | Admitting: Internal Medicine

## 2020-10-08 DIAGNOSIS — M5412 Radiculopathy, cervical region: Secondary | ICD-10-CM | POA: Diagnosis not present

## 2020-10-08 DIAGNOSIS — G894 Chronic pain syndrome: Secondary | ICD-10-CM | POA: Diagnosis not present

## 2020-10-08 DIAGNOSIS — Z79899 Other long term (current) drug therapy: Secondary | ICD-10-CM | POA: Diagnosis not present

## 2020-10-11 DIAGNOSIS — J449 Chronic obstructive pulmonary disease, unspecified: Secondary | ICD-10-CM | POA: Diagnosis not present

## 2020-10-11 DIAGNOSIS — I10 Essential (primary) hypertension: Secondary | ICD-10-CM | POA: Diagnosis not present

## 2020-10-12 DIAGNOSIS — M160 Bilateral primary osteoarthritis of hip: Secondary | ICD-10-CM | POA: Diagnosis not present

## 2020-10-12 DIAGNOSIS — E1142 Type 2 diabetes mellitus with diabetic polyneuropathy: Secondary | ICD-10-CM | POA: Diagnosis not present

## 2020-10-12 DIAGNOSIS — J449 Chronic obstructive pulmonary disease, unspecified: Secondary | ICD-10-CM | POA: Diagnosis not present

## 2020-10-12 DIAGNOSIS — F17210 Nicotine dependence, cigarettes, uncomplicated: Secondary | ICD-10-CM | POA: Diagnosis not present

## 2020-10-12 DIAGNOSIS — J42 Unspecified chronic bronchitis: Secondary | ICD-10-CM | POA: Diagnosis not present

## 2020-10-12 DIAGNOSIS — R2681 Unsteadiness on feet: Secondary | ICD-10-CM | POA: Diagnosis not present

## 2020-10-12 DIAGNOSIS — E039 Hypothyroidism, unspecified: Secondary | ICD-10-CM | POA: Diagnosis not present

## 2020-10-12 DIAGNOSIS — F250 Schizoaffective disorder, bipolar type: Secondary | ICD-10-CM | POA: Diagnosis not present

## 2020-10-12 DIAGNOSIS — D649 Anemia, unspecified: Secondary | ICD-10-CM | POA: Diagnosis not present

## 2020-10-13 ENCOUNTER — Telehealth (INDEPENDENT_AMBULATORY_CARE_PROVIDER_SITE_OTHER): Payer: Medicare HMO | Admitting: Psychiatry

## 2020-10-13 ENCOUNTER — Encounter (HOSPITAL_COMMUNITY): Payer: Self-pay | Admitting: Internal Medicine

## 2020-10-13 ENCOUNTER — Other Ambulatory Visit: Payer: Self-pay

## 2020-10-13 ENCOUNTER — Encounter (HOSPITAL_COMMUNITY): Payer: Self-pay | Admitting: Psychiatry

## 2020-10-13 DIAGNOSIS — F331 Major depressive disorder, recurrent, moderate: Secondary | ICD-10-CM | POA: Diagnosis not present

## 2020-10-13 MED ORDER — LORAZEPAM 0.5 MG PO TABS: 0.5000 mg | ORAL_TABLET | Freq: Two times a day (BID) | ORAL | 1 refills | Status: DC

## 2020-10-13 MED ORDER — SERTRALINE HCL 100 MG PO TABS: 100.0000 mg | ORAL_TABLET | Freq: Every day | ORAL | 2 refills | Status: DC

## 2020-10-13 MED ORDER — LAMOTRIGINE 100 MG PO TABS: 100.0000 mg | ORAL_TABLET | Freq: Two times a day (BID) | ORAL | 2 refills | Status: DC

## 2020-10-13 MED ORDER — BUPROPION HCL ER (XL) 150 MG PO TB24: 150.0000 mg | ORAL_TABLET | ORAL | 2 refills | Status: DC

## 2020-10-13 MED ORDER — TRAZODONE HCL 100 MG PO TABS: 100.0000 mg | ORAL_TABLET | Freq: Every day | ORAL | 2 refills | Status: DC

## 2020-10-13 MED ORDER — RISPERIDONE 0.5 MG PO TABS: 0.5000 mg | ORAL_TABLET | Freq: Every day | ORAL | 2 refills | Status: DC

## 2020-10-13 NOTE — Progress Notes (Signed)
Virtual Visit via Telephone Note  I connected with [name] on 10/13/20 at  3:40 PM EDT by telephone and verified that I am speaking with the correct person using two identifiers.  Location: Patient: home Provider: home office   I discussed the limitations, risks, security and privacy concerns of performing an evaluation and management service by telephone and the availability of in person appointments. I also discussed with the patient that there may be a patient responsible charge related to this service. The patient expressed understanding and agreed to proceed.     I discussed the assessment and treatment plan with the patient. The patient was provided an opportunity to ask questions and all were answered. The patient agreed with the plan and demonstrated an understanding of the instructions.   The patient was advised to call back or seek an in-person evaluation if the symptoms worsen or if the condition fails to improve as anticipated.  I provided 15 minutes of non-face-to-face time during this encounter.   Levonne Spiller, MD  [facility] MD/PA/NP OP Progress Note  10/13/2020 3:54 PM [name]  MRN:  [id number]  Chief Complaint:  Chief Complaint   Depression; Anxiety; Follow-up    HPI: This patient is a 70 year old divorced white female who lives alone in [place].  She had worked in a [employer] but is now on disability.  The patient returns for follow-up after 3 months.  She states overall she is doing okay.  She has quite a few aches and pains but is followed closely by her family physician.  She does not have anything new to report in terms of health issues.  She is working closely with her therapist.  She denies significant depression anxiety panic attacks problems sleeping or suicidal ideation. Visit Diagnosis:    ICD-10-CM   1. Major depressive disorder, recurrent episode, moderate (HCC)  F33.1       Past Psychiatric History: Numerous hospitalizations for  depression years ago including ECT treatment  Past Medical History:  Past Medical History:  Diagnosis Date   Allergy    Anemia    Anxiety    takes Ativan daily   Arthritis    Assistance needed for mobility    Bipolar disorder ([place])    takes Risperdal nightly   Blood transfusion    Brain tumor ([place])    Cancer ([place])    In her gum   Carpal tunnel syndrome of right wrist 05/23/2011   Cervical disc disorder with radiculopathy of cervical region 10/31/2012   Chronic back pain    Chronic idiopathic constipation    Chronic neck and back pain    Colon polyps    COPD (chronic obstructive pulmonary disease) with chronic bronchitis ([place]) 09/16/2013   Office Spirometry 10/30/2013-submaximal effort based on appearance of loop and curve. Numbers would fit with severe restriction but her physiologic capability may be better than this. FVC 0.91/44%, and 10.74/45%, FEV1/FVC 0.81, FEF 25-75% 1.43/69%     Diabetes mellitus    Type II   Diverticulosis    TCS 9/08 by Dr. Delfin Edis for diarrhea . Bx for micro scopic colitis negative.    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   Metabolic encephalopathy 4/33/2951   Migraines    chronic headaches   Mononeuritis lower limb    Narcolepsy    Osteoporosis    Pancreatitis 2006   due to Depakote with normal EUS    Schatzki's ring    non critical / EGD with ED 8/2011with RMR   Seizures ([place])    takes Lamictal daily.Last  seizure 3 yrs ago   Sleep apnea    on CPAP   Small bowel obstruction (HCC)    Stroke ([place])    left sided weakness, speech changes   Tubular adenoma of colon     Past Surgical History:  Procedure Laterality Date   ABDOMINAL HYSTERECTOMY  1978   BACK SURGERY  July 2012   BACTERIAL OVERGROWTH TEST N/A 05/05/2013   Procedure: BACTERIAL OVERGROWTH TEST;  Surgeon: Daneil Dolin, MD;  [facility];  Service: Endoscopy;  Laterality: N/A;  7:30   BIOPSY THYROID  2009   BRAIN SURGERY  11/2011   resection of meningioma   BREAST REDUCTION SURGERY  1994   CARDIAC CATHETERIZATION  05/10/2005   normal coronaries, normal LV systolic function and EF (Dr. Jackie Plum)   [place] Left 07/22/04   Dr. Aline Brochure   CATARACT EXTRACTION Bilateral    CHOLECYSTECTOMY  1984   COLONOSCOPY N/A 09/25/2012   OAC:ZYSAYTK diverticulosis.  colonic polyp-removed : tubular adenoma   CRANIOTOMY  11/23/2011   Procedure: CRANIOTOMY TUMOR EXCISION;  Surgeon: Hosie Spangle, MD;  Location: [facility];  Service: Neurosurgery;  Laterality: N/A;  Craniotomy for tumor resection   ESOPHAGOGASTRODUODENOSCOPY  12/29/2010   [place]-Retained food in the esophagus and stomach, small hiatal hernia, status post Maloney dilation of the esophagus   ESOPHAGOGASTRODUODENOSCOPY N/A 09/25/2012   ZSW:FUXNATFT atonic baggy esophagus status post Maloney dilation 29 F. Hiatal hernia   GIVENS CAPSULE STUDY N/A 01/15/2013   NORMAL.    IR GENERIC HISTORICAL  03/17/2016   IR RADIOLOGIST EVAL & MGMT 03/17/2016 [facility] RAD   LESION REMOVAL N/A 05/31/2015   Procedure: REMOVAL RIGHT AND LEFT LESIONS OF MANDIBLE;  Surgeon: Diona Browner, DDS;  Location: [facility];  Service: Oral Surgery;  Laterality: N/A;   MALONEY DILATION  12/29/2010   RMR;   NM MYOCAR PERF WALL MOTION  2006   "relavtiely normal" persantine, mild anterior thinning (breast attenuation artifact), no region of scar/ischemia   OVARIAN CYST REMOVAL     RECTOCELE REPAIR N/A  06/29/2015   Procedure: POSTERIOR REPAIR (RECTOCELE);  Surgeon: Jonnie Kind, MD;  Location: [facility];  Service: Gynecology;  Laterality: N/A;   REDUCTION MAMMAPLASTY Bilateral    SPINE SURGERY  09/29/2010   Dr. Rolena Infante   surgical excision of 3 tumors from right thigh and right buttock  and left upper thigh  2010   TOOTH EXTRACTION Bilateral 12/14/2014   Procedure: REMOVAL OF BILATERAL MANDIBULAR EXOSTOSES;  Surgeon: Diona Browner, DDS;  Location: [facility];  Service: Oral Surgery;  Laterality: Bilateral;   TRANSTHORACIC ECHOCARDIOGRAM  2010   EF 60-65%, mild conc LVH, grade 1 diastolic dysfunction; mildly calcified MV annulus with mildly thickened leaflets, mildly calcified MR annulus    Family Psychiatric History: see  below  Family History:  Family History  Problem Relation Age of Onset   Heart attack Mother        HTN   Pneumonia Father    Kidney failure Father    Diabetes Father    Pancreatic cancer Sister    Diabetes Brother    Hypertension Brother    Diabetes Brother    Cancer Sister        breast    Hypertension Son    Sleep apnea Son    Cancer Sister        pancreatic   Stroke Maternal Grandmother    Heart attack Maternal Grandfather    Alcohol abuse Maternal Uncle    Colon cancer Neg Hx    Anesthesia problems Neg Hx    Hypotension Neg Hx    Malignant hyperthermia Neg Hx    Pseudochol deficiency Neg Hx    Breast cancer Neg Hx     Social History:  Social History   Socioeconomic History   Marital status: Divorced    Spouse name: Not on file   Number of children: 1   Years of education: 12   Highest education level: High school graduate  Occupational History   Occupation: Disabled  Tobacco Use   Smoking status: Former    Packs/day: 0.50    Years: 30.00    Pack years: 15.00    Types: Cigarettes    Quit date: 06/11/2020    Years since quitting: 0.3   Smokeless tobacco: Never   Tobacco comments:    quit 7 weeks ago-07/14/2020-[facility]   Vaping Use   Vaping Use:  Never used  Substance and Sexual Activity   Alcohol use: No    Alcohol/week: 0.0 standard drinks    Comment:     Drug use: No   Sexual activity: Not Currently  Other Topics Concern   Not on file  Social History Narrative   01/29/18 Lives alone, has 3 aides, Mon- Fri 8 hrs, 2 hrs on Sat-Sun, RN manages her meds   Caffeine use: Drink coffee sometimes    Right handed    Social Determinants of Radio broadcast assistant Strain: Low Risk    Difficulty of Paying Living Expenses: Not very hard  Food Insecurity: No Food Insecurity   Worried About Charity fundraiser in the Last Year: Never true   [place] in the Last Year: Never true  Transportation Needs: No Transportation Needs   Lack of Transportation (Medical): No   Lack of Transportation (Non-Medical): No  Physical Activity: Inactive   Days of Exercise per Week: 0 days   Minutes of Exercise per Session: 0 min  Stress: Not on file  Social Connections: Moderately Isolated   Frequency of Communication with Friends and Family: Three times a week   Frequency of Social Gatherings with Friends and Family: Twice a week   Attends Religious Services: More than 4 times per year   Active Member of [employer] or Organizations: No   Attends Archivist Meetings: Never   Marital Status: Divorced    Allergies:  Allergies  Allergen Reactions   Lac Bovis Rash    Doesn't agree with stomach.    Phenazopyridine Hcl Hives   Cephalexin Hives   Flonase [Fluticasone]     "It gave me ulcers in my nose"   Iron Nausea And Vomiting   Milk-Related Compounds Other (See Comments)    Doesn't agree with stomach.    Penicillins Hives  Has patient had a PCN reaction causing immediate rash, facial/tongue/throat swelling, SOB or lightheadedness with hypotension: Yes Has patient had a PCN reaction causing severe rash involving mucus membranes or skin necrosis: No Has patient had a PCN reaction that required hospitalization No Has patient had  a PCN reaction occurring within the last 10 years: No If all of the above answers are "NO", then may proceed with Cephalosporin use.    Phenazopyridine Hcl Hives          Metabolic Disorder Labs: Lab Results  Component Value Date   HGBA1C 5.8 (A) 07/29/2020   MPG 108 09/30/2018   MPG 134 10/15/2016   No results found for: PROLACTIN Lab Results  Component Value Date   CHOL 128 09/01/2020   TRIG 232 (H) 09/01/2020   HDL 40 09/01/2020   CHOLHDL 3.2 09/01/2020   VLDL 26 07/19/2015   LDLCALC 51 09/01/2020   LDLCALC 59 09/23/2019   Lab Results  Component Value Date   TSH 0.631 12/11/2019   TSH 0.94 02/17/2019    Therapeutic Level Labs: Lab Results  Component Value Date   LITHIUM <0.06 (L) 10/15/2016   Lab Results  Component Value Date   VALPROATE <10.0 (L) 08/23/2007   No components found for:  CBMZ  Current Medications: Current Outpatient Medications  Medication Sig Dispense Refill   Accu-Chek Softclix Lancets lancets TEST BLOOD SUGAR THREE TIMES DAILY AS DIRECTED 300 each 2   albuterol (VENTOLIN HFA) 108 (90 Base) MCG/ACT inhaler INHALE 1 TO 2 PUFFS EVERY 6 HOURS AS NEEDED FOR WHEEZING, SHORTNESS OF BREATH (Patient not taking: Reported on 09/22/2020) 54 each 1   Alcohol Swabs (B-D SINGLE USE SWABS REGULAR) PADS USE TO CLEAN FINGER PRIOR TO STICKING FOR BLOOD SUGAR. 100 each 11   alendronate (FOSAMAX) 70 MG tablet TAKE 1 TABLET (70 MG TOTAL) BY MOUTH EVERY 7 (SEVEN) DAYS. TAKE WITH A FULL GLASS OF WATER ON AN EMPTY STOMACH. 12 tablet 3   amLODipine (NORVASC) 10 MG tablet TAKE 1 TABLET EVERY DAY 90 tablet 3   ascorbic acid (VITAMIN C) 500 MG tablet Take 500 mg by mouth daily.     aspirin EC 81 MG tablet Take 1 tablet (81 mg total) by mouth daily with breakfast. 120 tablet 2   betamethasone dipropionate 0.05 % cream Apply topically 2 (two) times daily as needed (Rash). 45 g 3   Blood Glucose Calibration (ACCU-CHEK GUIDE CONTROL) LIQD USE PRN TO CALIBRATE GLUCOMETER 3 each  11   blood glucose meter kit and supplies Dispense based on patient and insurance preference. Use up to four times daily as directed. (FOR ICD-10 E10.9, E11.9). 1 each 0   buPROPion (WELLBUTRIN XL) 150 MG 24 hr tablet Take 1 tablet (150 mg total) by mouth every morning. 90 tablet 2   cetirizine (ZYRTEC) 10 MG tablet Take 1 tablet (10 mg total) by mouth daily. 7 tablet 0   clobetasol cream (TEMOVATE) 2.02 % Apply 1 application topically 2 (two) times daily.      Continuous Blood Gluc Sensor (FREESTYLE LIBRE 14 DAY SENSOR) MISC 1 each by Does not apply route every 14 (fourteen) days. Change every 2 weeks 2 each 11   diclofenac Sodium (VOLTAREN) 1 % GEL APPLY 2 GRAMS  TOPICALLY 4 (FOUR) TIMES DAILY. 800 g 0   ezetimibe (ZETIA) 10 MG tablet Take 1 tablet (10 mg total) by mouth daily. 90 tablet 1   famotidine (PEPCID) 40 MG tablet Take 40 mg by mouth 2 (two) times  daily.     glucose blood (ACCU-CHEK GUIDE) test strip USE TO CHECK BLOOD SUGAR FOUR TIMES A DAY AND PRN 400 each 11   hydrOXYzine (ATARAX/VISTARIL) 10 MG tablet Take 1 tablet (10 mg total) by mouth 3 (three) times daily as needed. (Patient not taking: Reported on 09/22/2020) 30 tablet 0   insulin aspart (NOVOLOG FLEXPEN) 100 UNIT/ML FlexPen Inject 16 to 18 units under skin before meals up to 3 times a day 45 mL 3   Insulin Pen Needle (SURE COMFORT PEN NEEDLES) 31G X 8 MM MISC USE TO for injecting insulin 4 TIMES DAILY. 200 each 3   lamoTRIgine (LAMICTAL) 100 MG tablet Take 1 tablet (100 mg total) by mouth 2 (two) times daily. 180 tablet 2   levothyroxine (SYNTHROID) 50 MCG tablet Take 1 tablet (50 mcg) 6 days 1/2 tablet (25 mcg) 1 day weekly. 80 tablet 3   LORazepam (ATIVAN) 0.5 MG tablet Take 1 tablet (0.5 mg total) by mouth 2 (two) times daily. 180 tablet 1   losartan (COZAAR) 50 MG tablet Take 1 tablet (50 mg total) by mouth daily. 90 tablet 1   meclizine (ANTIVERT) 25 MG tablet Take 1 tablet (25 mg total) by mouth 3 (three) times daily as  needed for dizziness. (Patient not taking: Reported on 09/22/2020) 30 tablet 0   metoprolol tartrate (LOPRESSOR) 50 MG tablet TAKE 1 TABLET TWICE DAILY (NEED MD APPOINTMENT) 180 tablet 3   montelukast (SINGULAIR) 10 MG tablet Take 1 tablet (10 mg total) by mouth daily. 90 tablet 1   nystatin (MYCOSTATIN/NYSTOP) powder APPLY TO AFFECTED AREA 4 TIMES DAILY. 90 g 1   Omega-3 Fatty Acids (FISH OIL PO) Take 1 capsule by mouth daily.     oxyCODONE (OXY IR/ROXICODONE) 5 MG immediate release tablet Take 5 mg by mouth every 6 (six) hours as needed. (Patient not taking: Reported on 09/22/2020)     Plecanatide (TRULANCE) 3 MG TABS Take 3 mg by mouth daily as needed. 30 tablet 0   pregabalin (LYRICA) 75 MG capsule Take 1 capsule (75 mg total) by mouth daily.     RABEprazole (ACIPHEX) 20 MG tablet TAKE 1 TABLET TWICE DAILY (MUST KEEP 07/2020 APPT FOR FURTHER REFILLS) 180 tablet 0   RESTASIS 0.05 % ophthalmic emulsion      risperiDONE (RISPERDAL) 0.5 MG tablet Take 1 tablet (0.5 mg total) by mouth at bedtime. 90 tablet 2   rosuvastatin (CRESTOR) 5 MG tablet TAKE 1 TABLET AT BEDTIME 90 tablet 2   sertraline (ZOLOFT) 100 MG tablet Take 1 tablet (100 mg total) by mouth daily. 90 tablet 2   Sodium Sulfate-Mag Sulfate-KCl (SUTAB) [phone] MG TABS Use as directed for colonoscopy. MANUFACTURER CODES!! BIN: [id number] PCN: CN GROUP: [id number] MEMBER ID: [id number];BVQ AS SECONDARY INSURANCE ;NO PRIOR AUTHORIZATION 24 tablet 0   STIOLTO RESPIMAT 2.5-2.5 MCG/ACT AERS INHALE 2 PUFFS INTO THE LUNGS DAILY. 12 g 1   TOUJEO MAX SOLOSTAR 300 UNIT/ML Solostar Pen INJECT 24 UNITS SUBCUTANEOUSLY INTO THE SKIN AT BEDTIME 12 mL 3   traZODone (DESYREL) 100 MG tablet Take 1 tablet (100 mg total) by mouth at bedtime. 90 tablet 2   triamcinolone (KENALOG) 0.1 % Apply 1 application topically daily. 15 g 1   UNABLE TO [facility] bed mattress x 1  DX: G47.33, J44.9 1 each 0   UNABLE TO FIND Standard wheelchair Dx M47.16, R29.898 1 each  0   No current facility-administered medications for this visit.     Musculoskeletal: Strength &  Muscle Tone: decreased Gait & Station: unsteady Patient leans: N/A  Psychiatric Specialty Exam: Review of Systems  Musculoskeletal:  Positive for arthralgias, back pain and myalgias.   There were no vitals taken for this visit.There is no height or weight on file to calculate BMI.  General Appearance: NA  Eye Contact:  NA  Speech:  Clear and Coherent  Volume:  Decreased  Mood:  Euthymic  Affect:  NA  Thought Process:  Goal Directed  Orientation:  Full (Time, Place, and Person)  Thought Content: Rumination   Suicidal Thoughts:  No  Homicidal Thoughts:  No  Memory:  Immediate;   Good Recent;   Fair Remote;   NA  Judgement:  Fair  Insight:  Fair  Psychomotor Activity:  Decreased  Concentration:  Concentration: Fair and Attention Span: Fair  Recall:  Good  Fund of Knowledge: Good  Language: Good  Akathisia:  No  Handed:  Right  AIMS (if indicated): not done  Assets:  Communication Skills Desire for Improvement Resilience Social Support  ADL's:  Intact  Cognition: WNL  Sleep:  Good   Screenings: Mini-Mental    Flowsheet Row Office Visit from 02/03/2019 in [facility] Office Visit from 09/08/2015 in [facility] from 07/02/2014 in [facility] from 12/08/2013 in [facility]  Total Score (max 30 points ) _0 PHQ2-9    Flowsheet Row Video Visit from 10/13/2020 in [facility] Office Visit from 09/01/2020 in [facility] from 07/07/2020 in [facility] Video Visit from 07/01/2020 in [facility] from 06/17/2020 in [facility]  PHQ-2 Total Score 0 1 0 2 5  PHQ-9 Total Score -- -- -- -- 16      Flowsheet Row  Video Visit from 10/13/2020 in [facility] Video Visit from 07/01/2020 in [facility] from 06/17/2020 in [facility]  C-SSRS RISK CATEGORY No Risk No Risk Low Risk        Assessment and Plan: Patient is a 70 year old female with a long history of depression and anxiety.  She seems to be stable on her current regimen despite numerous physical problems.  She will continue Wellbutrin XL 150 mg daily for depression, Zoloft 100 mg daily for depression, Lamictal 100 mg twice daily for mood stabilization, trazodone 100 mg at bedtime for sleep and Ativan 0.5 mg twice daily for anxiety and Risperdal 0.5 mg at bedtime for mood stabilization.  She will return to see me in 3 months   Levonne Spiller, MD 10/13/2020, 3:54 PM

## 2020-10-14 ENCOUNTER — Ambulatory Visit: Payer: Medicare HMO | Admitting: Family Medicine

## 2020-10-15 ENCOUNTER — Other Ambulatory Visit: Payer: Self-pay

## 2020-10-15 ENCOUNTER — Ambulatory Visit: Payer: Medicare HMO | Admitting: Nurse Practitioner

## 2020-10-15 ENCOUNTER — Encounter: Payer: Self-pay | Admitting: Family Medicine

## 2020-10-15 ENCOUNTER — Ambulatory Visit (INDEPENDENT_AMBULATORY_CARE_PROVIDER_SITE_OTHER): Payer: Medicare HMO | Admitting: Family Medicine

## 2020-10-15 VITALS — BP 106/65 | HR 65 | Ht 60.0 in | Wt 169.2 lb

## 2020-10-15 DIAGNOSIS — F3170 Bipolar disorder, currently in remission, most recent episode unspecified: Secondary | ICD-10-CM | POA: Diagnosis not present

## 2020-10-15 DIAGNOSIS — R296 Repeated falls: Secondary | ICD-10-CM | POA: Diagnosis not present

## 2020-10-15 DIAGNOSIS — G894 Chronic pain syndrome: Secondary | ICD-10-CM

## 2020-10-15 DIAGNOSIS — E1143 Type 2 diabetes mellitus with diabetic autonomic (poly)neuropathy: Secondary | ICD-10-CM

## 2020-10-15 DIAGNOSIS — I10 Essential (primary) hypertension: Secondary | ICD-10-CM | POA: Diagnosis not present

## 2020-10-15 DIAGNOSIS — E785 Hyperlipidemia, unspecified: Secondary | ICD-10-CM

## 2020-10-15 NOTE — Patient Instructions (Addendum)
Keep annual appointment as before, call if you need me sooner  Please reduce fried an fatty foods, TG are high.  Be careful not to fall  Need Covid booster  Call in September for your flu vaccine  Keep mammogram appointment in October

## 2020-10-16 ENCOUNTER — Encounter (HOSPITAL_COMMUNITY): Payer: Self-pay | Admitting: *Deleted

## 2020-10-16 ENCOUNTER — Emergency Department (HOSPITAL_COMMUNITY)
Admission: EM | Admit: 2020-10-16 | Discharge: 2020-10-16 | Disposition: A | Discharge status: Home / Self Care | Payer: Medicare HMO | Attending: Emergency Medicine | Admitting: Emergency Medicine

## 2020-10-16 ENCOUNTER — Emergency Department (HOSPITAL_COMMUNITY): Payer: Medicare HMO

## 2020-10-16 DIAGNOSIS — Z79899 Other long term (current) drug therapy: Secondary | ICD-10-CM | POA: Diagnosis not present

## 2020-10-16 DIAGNOSIS — E1149 Type 2 diabetes mellitus with other diabetic neurological complication: Secondary | ICD-10-CM | POA: Diagnosis not present

## 2020-10-16 DIAGNOSIS — E1136 Type 2 diabetes mellitus with diabetic cataract: Secondary | ICD-10-CM | POA: Diagnosis not present

## 2020-10-16 DIAGNOSIS — Z86011 Personal history of benign neoplasm of the brain: Secondary | ICD-10-CM | POA: Diagnosis not present

## 2020-10-16 DIAGNOSIS — S060X0A Concussion without loss of consciousness, initial encounter: Secondary | ICD-10-CM

## 2020-10-16 DIAGNOSIS — Z794 Long term (current) use of insulin: Secondary | ICD-10-CM | POA: Insufficient documentation

## 2020-10-16 DIAGNOSIS — Z85038 Personal history of other malignant neoplasm of large intestine: Secondary | ICD-10-CM | POA: Diagnosis not present

## 2020-10-16 DIAGNOSIS — E1169 Type 2 diabetes mellitus with other specified complication: Secondary | ICD-10-CM | POA: Diagnosis not present

## 2020-10-16 DIAGNOSIS — M542 Cervicalgia: Secondary | ICD-10-CM | POA: Insufficient documentation

## 2020-10-16 DIAGNOSIS — E785 Hyperlipidemia, unspecified: Secondary | ICD-10-CM | POA: Diagnosis not present

## 2020-10-16 DIAGNOSIS — E1143 Type 2 diabetes mellitus with diabetic autonomic (poly)neuropathy: Secondary | ICD-10-CM | POA: Insufficient documentation

## 2020-10-16 DIAGNOSIS — Z7982 Long term (current) use of aspirin: Secondary | ICD-10-CM | POA: Insufficient documentation

## 2020-10-16 DIAGNOSIS — Z85818 Personal history of malignant neoplasm of other sites of lip, oral cavity, and pharynx: Secondary | ICD-10-CM | POA: Insufficient documentation

## 2020-10-16 DIAGNOSIS — I10 Essential (primary) hypertension: Secondary | ICD-10-CM | POA: Insufficient documentation

## 2020-10-16 DIAGNOSIS — S0990XA Unspecified injury of head, initial encounter: Secondary | ICD-10-CM | POA: Diagnosis present

## 2020-10-16 DIAGNOSIS — E039 Hypothyroidism, unspecified: Secondary | ICD-10-CM | POA: Diagnosis not present

## 2020-10-16 DIAGNOSIS — W01198A Fall on same level from slipping, tripping and stumbling with subsequent striking against other object, initial encounter: Secondary | ICD-10-CM | POA: Diagnosis not present

## 2020-10-16 DIAGNOSIS — J449 Chronic obstructive pulmonary disease, unspecified: Secondary | ICD-10-CM | POA: Insufficient documentation

## 2020-10-16 DIAGNOSIS — Z87891 Personal history of nicotine dependence: Secondary | ICD-10-CM | POA: Insufficient documentation

## 2020-10-16 DIAGNOSIS — S0003XA Contusion of scalp, initial encounter: Secondary | ICD-10-CM | POA: Diagnosis not present

## 2020-10-16 MED ORDER — METHOCARBAMOL 500 MG PO TABS: 500.0000 mg | ORAL_TABLET | Freq: Two times a day (BID) | ORAL | 0 refills | Status: DC | PRN

## 2020-10-16 MED ORDER — HYDROCODONE-ACETAMINOPHEN 5-325 MG PO TABS
2.0000 | ORAL_TABLET | Freq: Once | ORAL | Status: AC
  Administered 2020-10-16: 2 via ORAL
  Filled 2020-10-16: qty 2

## 2020-10-16 NOTE — ED Triage Notes (Signed)
Pt had a drawer from a dresser with clothes in it that fell on pt's head and then hit her head on floor, this occurred Thursday.  Pt states blurry vision to left eye and right neck pain since incident.  Denies LOC or N/V.  On ASA

## 2020-10-16 NOTE — ED Notes (Signed)
PT to CT at this time.

## 2020-10-16 NOTE — ED Provider Notes (Signed)
[facility] EMERGENCY DEPARTMENT Provider Note   CSN: [id number] Arrival date & time: 10/16/20  1132     History Chief Complaint  Patient presents with   Head Injury    [name] is a 70 y.o. female.   Head Injury  This patient is a very pleasant 70 year old female, she has a history of diffuse arthritis, she has some element of chronic pain in her neck and her back, she also has a torn rotator cuff on one side.  She reports that 2 days ago she was going through some drawers and a dresser in her home, the door was open and when she bent over to get something lower off the ground the drawer fell out and struck her on the back of the head causing a hematoma to the posterior left scalp.  She reports that this has caused some neck pain and has had some intermittent blurry vision on the left side as well.  She denies any losses of consciousness, she denies taking any blood thinners, she denies any numbness or weakness of the arms or the legs though she does have some chronic pain in those areas.  Past Medical History:  Diagnosis Date   Allergy    Anemia    Anxiety    takes Ativan daily   Arthritis    Assistance needed for mobility    Bipolar disorder ([place])    takes Risperdal nightly   Blood transfusion    Brain tumor ([place])    Cancer ([place])    In her gum   Carpal tunnel syndrome of right wrist 05/23/2011   Cervical disc disorder with radiculopathy of cervical region 10/31/2012   Chronic back pain    Chronic idiopathic constipation    Chronic neck and back pain    Colon polyps    COPD (chronic obstructive pulmonary disease) with chronic bronchitis ([place]) 09/16/2013   Office Spirometry 10/30/2013-submaximal effort based on appearance of loop and curve. Numbers would fit with severe restriction but her physiologic capability may be better than this. FVC 0.91/44%, and 10.74/45%, FEV1/FVC 0.81, FEF 25-75% 1.43/69%     Diabetes mellitus    Type II   Diverticulosis    TCS 9/08 by Dr.  Delfin Edis for diarrhea . Bx for micro scopic colitis negative.    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   Metabolic encephalopathy 0/12/2723   Migraines    chronic headaches   Mononeuritis lower limb    Narcolepsy    Osteoporosis    Pancreatitis 2006   due to Depakote with normal EUS    Schatzki's ring    non critical / EGD with ED 8/2011with RMR   Seizures ([place])    takes Lamictal daily.Last seizure 3 yrs ago   Sleep apnea    on CPAP   Small bowel obstruction ([place])    Stroke ([place])  left sided weakness, speech changes   Tubular adenoma of colon     Patient Active Problem List   Diagnosis Date Noted   Closed fracture of lateral malleolus of right fibula 02/27/2020   Tubular adenoma of colon 02/09/2019   Benign neoplasm of cerebral meninges ([place]) 11/12/2018   Benign meningioma of brain ([place]) 10/31/2018   Osteoarthritis of both hips 07/06/2018   Lipoma of extremity 12/31/2017   Impingement syndrome of left shoulder region 12/27/2017   Leg weakness, bilateral 10/28/2017   Lumbar spondylosis with myelopathy 10/12/2017   Numbness of hand 10/10/2017   Chronic neck pain 07/25/2017   Chronic pain syndrome 07/25/2017   At risk for cardiovascular event 06/10/2017   Diabetic polyneuropathy associated with type 2 diabetes mellitus ([place]) 05/19/2016   Tobacco user 04/24/2016   Obesity (BMI 30.0-34.9) 04/24/2016   History of palpitations 08/09/2015    Nausea 08/09/2015   Labile hypertension 08/03/2015   Normal coronary arteries 08/03/2015   Dizziness 07/15/2015   Left-sided low back pain with left-sided sciatica 06/27/2015   Multinodular goiter 05/06/2015   Rectocele, female 04/27/2015   Anal sphincter incontinence 04/27/2015   Pelvic relaxation due to rectocele 03/30/2015   Pulmonary hypertension ([place]) 02/22/2015   Migraine without aura and without status migrainosus, not intractable 07/02/2014   Flatulence 02/18/2014   Microcytic anemia 02/18/2014   COPD mixed type ([place]) 09/16/2013   Hypothyroidism 08/16/2013   Gastroparesis 04/28/2013   Seizure disorder ([place]) 01/19/2013   Cervical disc disorder with radiculopathy of cervical region 10/31/2012   Solitary pulmonary nodule 08/19/2012   Anemia 07/05/2012   Hypersomnia disorder related to a known organic factor 06/11/2012   Pruritus 04/18/2012   Meningioma ([place]) 11/19/2011   Mononeuritis leg 10/25/2011   Carpal tunnel syndrome of right wrist 05/23/2011   Chronic pain of right hand 05/04/2011   Polypharmacy 04/28/2011   Bipolar disorder ([place]) 04/28/2011   Constipation 04/13/2011   Recurrent falls 12/12/2010   Oropharyngeal dysphagia 07/12/2010   Urinary incontinence 12/16/2009   HEARING LOSS 10/26/2009   Well controlled type 2 diabetes mellitus with gastroparesis ([place]) 07/07/2009   Hyperlipidemia 12/11/2008   IBS 12/11/2008   GERD 07/29/2008   MILK PRODUCTS ALLERGY 07/29/2008   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/03/2007   Essential hypertension 06/27/2007   Backache 06/19/2007   Osteoporosis 06/19/2007   Obstructive sleep apnea 06/19/2007   TRIGGER FINGER 04/18/2007   DIVERTICULOSIS, COLON 11/13/2006    Past Surgical History:  Procedure Laterality Date   ABDOMINAL HYSTERECTOMY  1978   BACK SURGERY  July 2012   BACTERIAL OVERGROWTH TEST N/A 05/05/2013   Procedure: BACTERIAL OVERGROWTH TEST;  Surgeon: Daneil Dolin, MD;  Location: [facility]  ENDO SUITE;  Service: Endoscopy;  Laterality: N/A;  7:30   BIOPSY THYROID  2009   BRAIN SURGERY  11/2011   resection of meningioma   BREAST REDUCTION SURGERY  1994   CARDIAC CATHETERIZATION  05/10/2005   normal coronaries, normal LV systolic function and EF (Dr. Jackie Plum)   [place] Left 07/22/04   Dr. Aline Brochure   CATARACT EXTRACTION Bilateral    CHOLECYSTECTOMY  1984   COLONOSCOPY N/A 09/25/2012   VBT:YOMAYOK diverticulosis.  colonic polyp-removed : tubular adenoma   CRANIOTOMY  11/23/2011   Procedure: CRANIOTOMY TUMOR EXCISION;  Surgeon: Hosie Spangle, MD;  Location: [facility] NEURO ORS;  Service: Neurosurgery;  Laterality: N/A;  Craniotomy for tumor resection   ESOPHAGOGASTRODUODENOSCOPY  12/29/2010   [place]-Retained food in the esophagus and  stomach, small hiatal hernia, status post Maloney dilation of the esophagus   ESOPHAGOGASTRODUODENOSCOPY N/A 09/25/2012   LXB:WIOMBTDH atonic baggy esophagus status post Maloney dilation 28 F. Hiatal hernia   GIVENS CAPSULE STUDY N/A 01/15/2013   NORMAL.    IR GENERIC HISTORICAL  03/17/2016   IR RADIOLOGIST EVAL & MGMT 03/17/2016 [facility]-INTERV RAD   LESION REMOVAL N/A 05/31/2015   Procedure: REMOVAL RIGHT AND LEFT LESIONS OF MANDIBLE;  Surgeon: Diona Browner, DDS;  Location: [facility];  Service: Oral Surgery;  Laterality: N/A;   MALONEY DILATION  12/29/2010   RMR;   NM MYOCAR PERF WALL MOTION  2006   "relavtiely normal" persantine, mild anterior thinning (breast attenuation artifact), no region of scar/ischemia   OVARIAN CYST REMOVAL     RECTOCELE REPAIR N/A 06/29/2015   Procedure: POSTERIOR REPAIR (RECTOCELE);  Surgeon: Jonnie Kind, MD;  Location: [facility] ORS;  Service: Gynecology;  Laterality: N/A;   REDUCTION MAMMAPLASTY Bilateral    SPINE SURGERY  09/29/2010   Dr. Rolena Infante   surgical excision of 3 tumors from right thigh and right buttock  and left upper thigh  2010   TOOTH EXTRACTION Bilateral 12/14/2014   Procedure: REMOVAL OF BILATERAL MANDIBULAR  EXOSTOSES;  Surgeon: Diona Browner, DDS;  Location: [facility];  Service: Oral Surgery;  Laterality: Bilateral;   TRANSTHORACIC ECHOCARDIOGRAM  2010   EF 60-65%, mild conc LVH, grade 1 diastolic dysfunction; mildly calcified MV annulus with mildly thickened leaflets, mildly calcified MR annulus     OB History     Gravida  6   Para  1   Term  1   Preterm      AB  5   Living         SAB  5   IAB      Ectopic      Multiple      Live Births              Family History  Problem Relation Age of Onset   Heart attack Mother        HTN   Pneumonia Father    Kidney failure Father    Diabetes Father    Pancreatic cancer Sister    Diabetes Brother    Hypertension Brother    Diabetes Brother    Cancer Sister        breast    Hypertension Son    Sleep apnea Son    Cancer Sister        pancreatic   Stroke Maternal Grandmother    Heart attack Maternal Grandfather    Alcohol abuse Maternal Uncle    Colon cancer Neg Hx    Anesthesia problems Neg Hx    Hypotension Neg Hx    Malignant hyperthermia Neg Hx    Pseudochol deficiency Neg Hx    Breast cancer Neg Hx     Social History   Tobacco Use   Smoking status: Former    Packs/day: 0.50    Years: 30.00    Pack years: 15.00    Types: Cigarettes    Quit date: 06/11/2020    Years since quitting: 0.3   Smokeless tobacco: Never   Tobacco comments:    quit 7 weeks ago-07/14/2020-[facility]   Vaping Use   Vaping Use: Never used  Substance Use Topics   Alcohol use: No    Alcohol/week: 0.0 standard drinks    Comment:     Drug use: No    Home Medications Prior to  Admission medications   Medication Sig Start Date End Date Taking? Authorizing Provider  Accu-Chek Softclix Lancets lancets TEST BLOOD SUGAR THREE TIMES DAILY AS DIRECTED 04/20/20   Philemon Kingdom, MD  albuterol (VENTOLIN HFA) 108 (90 Base) MCG/ACT inhaler INHALE 1 TO 2 PUFFS EVERY 6 HOURS AS NEEDED FOR WHEEZING, SHORTNESS OF BREATH 06/02/20   Baird Lyons D, MD   Alcohol Swabs (B-D SINGLE USE SWABS REGULAR) PADS USE TO CLEAN FINGER PRIOR TO STICKING FOR BLOOD SUGAR. 08/12/19   Philemon Kingdom, MD  alendronate (FOSAMAX) 70 MG tablet TAKE 1 TABLET (70 MG TOTAL) BY MOUTH EVERY 7 (SEVEN) DAYS. TAKE WITH A FULL GLASS OF WATER ON AN EMPTY STOMACH. 02/09/20   [name], MD  amLODipine (NORVASC) 10 MG tablet TAKE 1 TABLET EVERY DAY 01/20/20   Imogene Burn, PA-C  ascorbic acid (VITAMIN C) 500 MG tablet Take 500 mg by mouth daily.    [provider]  aspirin EC 81 MG tablet Take 1 tablet (81 mg total) by mouth daily with breakfast. 12/08/18   Emokpae, Courage, MD  betamethasone dipropionate 0.05 % cream Apply topically 2 (two) times daily as needed ([place]). 02/17/20   Lavonna Monarch, MD  Blood Glucose Calibration (ACCU-CHEK GUIDE CONTROL) LIQD USE PRN TO CALIBRATE GLUCOMETER 08/12/19   Philemon Kingdom, MD  blood glucose meter kit and supplies Dispense based on patient and insurance preference. Use up to four times daily as directed. (FOR ICD-10 E10.9, E11.9). 06/27/19   Philemon Kingdom, MD  buPROPion (WELLBUTRIN XL) 150 MG 24 hr tablet Take 1 tablet (150 mg total) by mouth every morning. 10/13/20 10/13/21  Cloria Spring, MD  cetirizine (ZYRTEC) 10 MG tablet Take 1 tablet (10 mg total) by mouth daily. 07/30/17   [name], MD  clobetasol cream (TEMOVATE) 1.50 % Apply 1 application topically 2 (two) times daily.  07/30/18   [provider]  Continuous Blood Gluc Sensor (FREESTYLE LIBRE 14 DAY SENSOR) MISC 1 each by Does not apply route every 14 (fourteen) days. Change every 2 weeks 06/24/19   Philemon Kingdom, MD  diclofenac Sodium (VOLTAREN) 1 % GEL APPLY 2 GRAMS  TOPICALLY 4 (FOUR) TIMES DAILY. 06/19/19   Esterwood, Amy S, PA-C  ezetimibe (ZETIA) 10 MG tablet Take 1 tablet (10 mg total) by mouth daily. 05/20/20   [name], MD  famotidine (PEPCID) 40 MG tablet Take 40 mg by mouth 2 (two) times daily. 08/30/20   [provider]  glucose blood (ACCU-CHEK GUIDE) test strip USE TO CHECK BLOOD SUGAR FOUR TIMES A DAY AND PRN 08/12/19   Philemon Kingdom, MD  hydrOXYzine (ATARAX/VISTARIL) 10 MG tablet Take 1 tablet (10 mg total) by mouth 3 (three) times daily as needed. 08/03/20   [name], MD  insulin aspart (NOVOLOG FLEXPEN) 100 UNIT/ML FlexPen Inject 16 to 18 units under skin before meals up to 3 times a day 03/08/20   Philemon Kingdom, MD  Insulin Pen Needle (SURE COMFORT PEN NEEDLES) 31G X 8 MM MISC USE TO for injecting insulin 4 TIMES DAILY. 03/08/20   Philemon Kingdom, MD  lamoTRIgine (LAMICTAL) 100 MG tablet Take 1 tablet (100 mg total) by mouth 2 (two) times daily. 10/13/20   Cloria Spring, MD  levothyroxine (SYNTHROID) 50 MCG tablet Take 1 tablet (50 mcg) 6 days 1/2 tablet (25 mcg) 1 day weekly. 08/09/20   Philemon Kingdom, MD  LORazepam (ATIVAN) 0.5 MG tablet Take 1 tablet (0.5 mg total) by mouth 2 (two)  times daily. 10/13/20   Cloria Spring, MD  losartan (COZAAR) 50 MG tablet Take 1 tablet (50 mg total) by mouth daily. 05/17/20   [name], MD  meclizine (ANTIVERT) 25 MG tablet Take 1 tablet (25 mg total) by mouth 3 (three) times daily as needed for dizziness. 04/28/20   Noreene Larsson, NP  metoprolol tartrate (LOPRESSOR) 50 MG tablet TAKE 1 TABLET TWICE DAILY (NEED MD APPOINTMENT) 09/01/20   Ahmed Prima, Fransisco Hertz, PA-C  montelukast (SINGULAIR) 10 MG tablet Take 1 tablet (10 mg total) by mouth daily. 05/17/20   [name], MD  nystatin (MYCOSTATIN/NYSTOP) powder APPLY TO AFFECTED AREA 4 TIMES DAILY. 05/10/20   [name], MD  Omega-3 Fatty Acids (FISH OIL PO) Take 1 capsule by mouth daily.    [provider]  oxyCODONE (OXY IR/ROXICODONE) 5 MG immediate release tablet Take 5 mg by mouth every 6 (six) hours as needed. 03/02/20   [provider]  Plecanatide (TRULANCE) 3 MG TABS Take 3 mg by mouth daily as needed. 08/10/20   Pyrtle, Lajuan Lines, MD  pregabalin  (LYRICA) 75 MG capsule Take 1 capsule (75 mg total) by mouth daily. 10/20/16   Rexene Alberts, MD  RABEprazole (ACIPHEX) 20 MG tablet TAKE 1 TABLET TWICE DAILY (MUST KEEP 07/2020 APPT FOR FURTHER REFILLS) 09/08/20   Pyrtle, Lajuan Lines, MD  RESTASIS 0.05 % ophthalmic emulsion  07/29/20   [provider]  risperiDONE (RISPERDAL) 0.5 MG tablet Take 1 tablet (0.5 mg total) by mouth at bedtime. 10/13/20   Cloria Spring, MD  rosuvastatin (CRESTOR) 5 MG tablet TAKE 1 TABLET AT BEDTIME 06/21/20   [name], MD  sertraline (ZOLOFT) 100 MG tablet Take 1 tablet (100 mg total) by mouth daily. 10/13/20   Cloria Spring, MD  Sodium Sulfate-Mag Sulfate-KCl (SUTAB) [phone] MG TABS Use as directed for colonoscopy. MANUFACTURER CODES!! BIN: [id number] PCN: CN GROUP: [id number] MEMBER ID: [id number];[employer] AS SECONDARY INSURANCE ;NO PRIOR AUTHORIZATION 08/10/20   Pyrtle, Lajuan Lines, MD  STIOLTO RESPIMAT 2.5-2.5 MCG/ACT AERS INHALE 2 PUFFS INTO THE LUNGS DAILY. 05/31/20   Parrett, Fonnie Mu, NP  TOUJEO MAX SOLOSTAR 300 UNIT/ML Solostar Pen INJECT 24 UNITS SUBCUTANEOUSLY INTO THE SKIN AT BEDTIME 03/01/20   Philemon Kingdom, MD  traZODone (DESYREL) 100 MG tablet Take 1 tablet (100 mg total) by mouth at bedtime. 10/13/20   Cloria Spring, MD  triamcinolone (KENALOG) 0.1 % Apply 1 application topically daily. 04/20/20   Lavonna Monarch, MD  UNABLE TO [facility] bed mattress x 1  DX: G47.33, J44.9 10/16/19   [name], MD  UNABLE TO FIND Standard wheelchair Dx [phone], [phone] 03/23/20   [name], MD    Allergies    Lac bovis, Phenazopyridine hcl, Cephalexin, Flonase [fluticasone], Iron, Milk-related compounds, Penicillins, and Phenazopyridine hcl  Review of Systems   Review of Systems  All other systems reviewed and are negative.  Physical Exam Updated Vital Signs BP (!) 132/52   Pulse 60   Temp 98.4 F (36.9 C) (Oral)   Resp (!) 22   Ht 1.524 m (5')   Wt 76.7 kg   SpO2 97%   BMI 33.01 kg/m    Physical Exam Vitals and nursing note reviewed.  Constitutional:      General: She is not in acute distress.    Appearance: She is well-developed.  HENT:     Head: Normocephalic.     Comments: Hematoma to the left occiput with  some bruising in the mastoid area on the left side    Mouth/Throat:     Pharynx: No oropharyngeal exudate.  Eyes:     General: No scleral icterus.       Right eye: No discharge.        Left eye: No discharge.     Conjunctiva/sclera: Conjunctivae normal.     Pupils: Pupils are equal, round, and reactive to light.  Neck:     Thyroid: No thyromegaly.     Vascular: No JVD.  Cardiovascular:     Rate and Rhythm: Normal rate and regular rhythm.     Heart sounds: Normal heart sounds. No murmur heard.   No friction rub. No gallop.  Pulmonary:     Effort: Pulmonary effort is normal. No respiratory distress.     Breath sounds: Normal breath sounds. No wheezing or rales.  Abdominal:     General: Bowel sounds are normal. There is no distension.     Palpations: Abdomen is soft. There is no mass.     Tenderness: There is no abdominal tenderness.  Musculoskeletal:        General: Normal range of motion.     Cervical back: Normal range of motion and neck supple. Tenderness present.     Right lower leg: No edema.     Left lower leg: No edema.     Comments: The patient has pain with range of motion of all 4 extremities, primarily the shoulders, no deformities.  Lymphadenopathy:     Cervical: No cervical adenopathy.  Skin:    General: Skin is warm and dry.     Findings: No erythema or rash.  Neurological:     Mental Status: She is alert.     Coordination: Coordination normal.     Comments: The patient is awake alert and able to follow commands, I am able to ask her to do well for extremity movements, cranial nerves III through XII appear normal, her visual acuity seems to be decreased in the left side, on visual inspection the patient has normal-appearing  conjunctive a pupils and periorbital tissues.  On posterior ophthalmologic exam there is no obvious retinal detachment, I can see the optic discs clearly as well as the vessels in the eyes.  Psychiatric:        Behavior: Behavior normal.    ED Results / Procedures / Treatments   Labs (all labs ordered are listed, but only abnormal results are displayed) Labs Reviewed - No data to display  EKG None  Radiology CT HEAD WO CONTRAST (5MM)  Result Date: 10/16/2020 CLINICAL DATA:  Head trauma, minor, normal mental status (Age 77-64y); Head trauma, scalp hematoma (Ped 0-1y) large hematoma - blurred vision - head injury 2 days ago. Right neck pain. EXAM: CT HEAD WITHOUT CONTRAST CT CERVICAL SPINE WITHOUT CONTRAST TECHNIQUE: Multidetector CT imaging of the head and cervical spine was performed following the standard protocol without intravenous contrast. Multiplanar CT image reconstructions of the cervical spine were also generated. COMPARISON:  MRI 04/22/2020 FINDINGS: CT HEAD FINDINGS Brain: Bifrontal craniotomy has been performed. Bifrontal encephalomalacia is unchanged. Surgical clips are again seen adjacent to the para falcine right frontal pole. No acute intracranial hemorrhage or infarct. No abnormal mass effect or midline shift. No abnormal intra or extra-axial mass lesion. Known mass along the planum sphenoidale appreciated on prior MRI examination is not well appreciated on this exam. The ventricular size is normal. Cerebellum is unremarkable. Vascular: No asymmetric hyperdense vasculature at the skull  base Skull: No acute fracture Sinuses/Orbits: The paranasal sinuses are clear. The orbits are unremarkable. Other: The mastoid air cells and middle ear cavities are clear. Moderate left parietal scalp hematoma noted. CT CERVICAL SPINE FINDINGS Alignment: Normal cervical lordosis.  No listhesis peer Skull base and vertebrae: The craniocervical alignment is normal. The atlantodental interval is not  widened. No acute fracture of the cervical spine. Soft tissues and spinal canal: No prevertebral fluid or swelling. No visible canal hematoma. Disc levels: There is intervertebral disc space narrowing and endplate remodeling of M7-J4 in keeping with changes of moderate to severe degenerative disc disease. Vertebral body heights are preserved. The prevertebral soft tissues are not thickened on sagittal reformats. Review of the axial images demonstrates multilevel uncovertebral and facet arthrosis resulting in moderate left neuroforaminal narrowing at C5-6 and mild bilateral neuroforaminal narrowing at C4-5 and C6-7. No significant canal stenosis. Upper chest: Unremarkable Other: None IMPRESSION: No acute intracranial injury. No calvarial fracture. Moderate left parietal scalp hematoma. Stable bifrontal encephalomalacia. Status post frontal craniotomy. Known residual malignancy along the planum sphenoidale, better appreciated on prior MRI examination of 04/22/2020 is not well appreciated on this examination. No acute fracture or listhesis of the cervical spine. Multilevel degenerative disc and degenerative joint disease resulting in mild to moderate multilevel neuroforaminal narrowing as described above. Electronically Signed   By: Fidela Salisbury MD   On: 10/16/2020 12:45   CT Cervical Spine Wo Contrast  Result Date: 10/16/2020 CLINICAL DATA:  Head trauma, minor, normal mental status (Age 52-64y); Head trauma, scalp hematoma (Ped 0-1y) large hematoma - blurred vision - head injury 2 days ago. Right neck pain. EXAM: CT HEAD WITHOUT CONTRAST CT CERVICAL SPINE WITHOUT CONTRAST TECHNIQUE: Multidetector CT imaging of the head and cervical spine was performed following the standard protocol without intravenous contrast. Multiplanar CT image reconstructions of the cervical spine were also generated. COMPARISON:  MRI 04/22/2020 FINDINGS: CT HEAD FINDINGS Brain: Bifrontal craniotomy has been performed. Bifrontal  encephalomalacia is unchanged. Surgical clips are again seen adjacent to the para falcine right frontal pole. No acute intracranial hemorrhage or infarct. No abnormal mass effect or midline shift. No abnormal intra or extra-axial mass lesion. Known mass along the planum sphenoidale appreciated on prior MRI examination is not well appreciated on this exam. The ventricular size is normal. Cerebellum is unremarkable. Vascular: No asymmetric hyperdense vasculature at the skull base Skull: No acute fracture Sinuses/Orbits: The paranasal sinuses are clear. The orbits are unremarkable. Other: The mastoid air cells and middle ear cavities are clear. Moderate left parietal scalp hematoma noted. CT CERVICAL SPINE FINDINGS Alignment: Normal cervical lordosis.  No listhesis peer Skull base and vertebrae: The craniocervical alignment is normal. The atlantodental interval is not widened. No acute fracture of the cervical spine. Soft tissues and spinal canal: No prevertebral fluid or swelling. No visible canal hematoma. Disc levels: There is intervertebral disc space narrowing and endplate remodeling of G9-E0 in keeping with changes of moderate to severe degenerative disc disease. Vertebral body heights are preserved. The prevertebral soft tissues are not thickened on sagittal reformats. Review of the axial images demonstrates multilevel uncovertebral and facet arthrosis resulting in moderate left neuroforaminal narrowing at C5-6 and mild bilateral neuroforaminal narrowing at C4-5 and C6-7. No significant canal stenosis. Upper chest: Unremarkable Other: None IMPRESSION: No acute intracranial injury. No calvarial fracture. Moderate left parietal scalp hematoma. Stable bifrontal encephalomalacia. Status post frontal craniotomy. Known residual malignancy along the planum sphenoidale, better appreciated on prior MRI examination of 04/22/2020 is  not well appreciated on this examination. No acute fracture or listhesis of the cervical  spine. Multilevel degenerative disc and degenerative joint disease resulting in mild to moderate multilevel neuroforaminal narrowing as described above. Electronically Signed   By: Fidela Salisbury MD   On: 10/16/2020 12:45    Procedures Procedures   Medications Ordered in ED Medications  HYDROcodone-acetaminophen (NORCO/VICODIN) 5-325 MG per tablet 2 tablet (2 tablets Oral Given 10/16/20 1258)    ED Course  I have reviewed the triage vital signs and the nursing notes.  Pertinent labs & imaging results that were available during my care of the patient were reviewed by me and considered in my medical decision making (see chart for details).    MDM Rules/Calculators/A&P                           The patient has normal peripheral visual fields, she has no diplopia, she has normal extraocular movements, she does have a injury to the left side of the scalp which will need to be evaluated with CT scan given the hematoma and bruising on her neck.  She is agreeable to the plan, her vital signs are unremarkable.  Unclear what is causing the visual changes - needs optho f/u.  Patient referred to ophthalmology, CTs negative for acute traumatic findings, patient is stable for discharge  Final Clinical Impression(s) / ED Diagnoses Final diagnoses:  Hematoma of occipital region of scalp  Concussion without loss of consciousness, initial encounter      Noemi Chapel, MD 10/16/20 1344

## 2020-10-16 NOTE — Discharge Instructions (Addendum)
The CT scan of your brain shows no signs of bleeding or fracture of your skull or your spine.  The pain that you are having is likely from the muscle spasms and tension after this injury.  This may last for another couple of weeks but should gradually improve, if you are able to take ibuprofen or Naprosyn I would encourage you to take that a couple of times a day to help, you may also apply warm compresses You have likely had a concussion, please read the attached instructions The hematoma should gradually get better over the next couple of days to a week, cold compresses over the hematoma may help it to heal faster You will need to see your doctor or an ophthalmologist within 2 days for a recheck, Monday would be best, I will give you the phone number for Dr. Posey Pronto who is on-call today if you do not have an eye doctor you should see Dr. Posey Pronto ER for worsening symptoms.

## 2020-10-18 ENCOUNTER — Encounter: Payer: Self-pay | Admitting: Family Medicine

## 2020-10-18 DIAGNOSIS — J449 Chronic obstructive pulmonary disease, unspecified: Secondary | ICD-10-CM | POA: Diagnosis not present

## 2020-10-18 DIAGNOSIS — F17210 Nicotine dependence, cigarettes, uncomplicated: Secondary | ICD-10-CM | POA: Diagnosis not present

## 2020-10-18 DIAGNOSIS — F250 Schizoaffective disorder, bipolar type: Secondary | ICD-10-CM | POA: Diagnosis not present

## 2020-10-18 DIAGNOSIS — R2681 Unsteadiness on feet: Secondary | ICD-10-CM | POA: Diagnosis not present

## 2020-10-18 DIAGNOSIS — D649 Anemia, unspecified: Secondary | ICD-10-CM | POA: Diagnosis not present

## 2020-10-18 DIAGNOSIS — J42 Unspecified chronic bronchitis: Secondary | ICD-10-CM | POA: Diagnosis not present

## 2020-10-18 DIAGNOSIS — R531 Weakness: Secondary | ICD-10-CM

## 2020-10-18 DIAGNOSIS — E1142 Type 2 diabetes mellitus with diabetic polyneuropathy: Secondary | ICD-10-CM | POA: Diagnosis not present

## 2020-10-18 DIAGNOSIS — M160 Bilateral primary osteoarthritis of hip: Secondary | ICD-10-CM | POA: Diagnosis not present

## 2020-10-18 DIAGNOSIS — E039 Hypothyroidism, unspecified: Secondary | ICD-10-CM | POA: Diagnosis not present

## 2020-10-18 NOTE — Assessment & Plan Note (Addendum)
Managed by pain clinic 

## 2020-10-18 NOTE — Assessment & Plan Note (Signed)
unsteady gait with recurrent falls, neurology follows pt Has recently had PT for imbalance Home safety reviewed  Adamant that she wants to live independently

## 2020-10-18 NOTE — Assessment & Plan Note (Signed)
Managed by Endo and controlled [name] is reminded of the importance of commitment to daily physical activity for 30 minutes or more, as able and the need to limit carbohydrate intake to 30 to 60 grams per meal to help with blood sugar control.   The need to take medication as prescribed, test blood sugar as directed, and to call between visits if there is a concern that blood sugar is uncontrolled is also discussed.   [name] is reminded of the importance of daily foot exam, annual eye examination, and good blood sugar, blood pressure and cholesterol control.  Diabetic Labs Latest Ref Rng & Units 09/01/2020 07/29/2020 05/04/2020 03/25/2020 02/20/2020  HbA1c 4.0 - 5.6 % - 5.8(A) - 5.3 -  Microalbumin Not Estab. ug/mL - - - - -  Micro/Creat Ratio 0 - 29 mg/g creat - - - - -  Chol 100 - 199 mg/dL 128 - - - -  HDL >39 mg/dL 40 - - - -  Calc LDL 0 - 99 mg/dL 51 - - - -  Triglycerides 0 - 149 mg/dL 232(H) - - - -  Creatinine 0.57 - 1.00 mg/dL 1.05(H) - 1.13(H) - 1.50(H)  GFR >60.00 mL/min - - - - -   BP/Weight 10/16/2020 10/15/2020 09/22/2020 09/01/2020 08/10/2020 07/29/2020 0000000  Systolic BP 123XX123 A999333 Q000111Q [id number] Q000111Q 0000000  Diastolic BP 59 65 52 72 66 68 67  Wt. (Lbs) 169 169.25 170.2 169 169 172.2 170  BMI 33.01 33.05 34.38 34.13 33.28 32.54 32.12  Some encounter information is confidential and restricted. Go to Review Flowsheets activity to see all data.   Foot/eye exam completion dates Latest Ref Rng & Units 05/26/2020 01/01/2019  Eye Exam No Retinopathy No Retinopathy -  Foot exam Order - - -  Foot Form Completion - - Done

## 2020-10-18 NOTE — Assessment & Plan Note (Signed)
Controlled, no change in medication  

## 2020-10-18 NOTE — Progress Notes (Signed)
[name]     MRN: [id number]      DOB: 28-Oct-1950   HPI [name] is here for follow up and re-evaluation of chronic medical conditions, medication management and review of any available recent lab and radiology data.  Preventive health is updated, specifically  Cancer screening and Immunization.   Questions or concerns regarding consultations or procedures which the PT has had in the interim are  addressed. The PT denies any adverse reactions to current medications since the last visit.  C/o increased gait instability and recurrent falls or near falls, still adamant that she will not go to a nursing home or facility, wants to live alone. Already getting pT and being followed by Neurology for this complain C/o increasingly of loneliness and states that she spends every holiday alone, she tries to make everyone happy while she is sad , not suicidal or homicidal, psych manages her depression C/o poor sleep and chronic fatigue Denies polyuria, polydipsia, blurred vision , or hypoglycemic episodes. Has a notarized form stating that her son and [name] are her HPOA and that she does not want to be admitted to [facility] if she requires Psych admission  ROS Denies recent fever or chills. Denies sinus pressure, nasal congestion, ear pain or sore throat. Denies chest congestion, productive cough or wheezing. Denies chest pains, palpitations and leg swelling Denies abdominal pain, nausea, vomiting,diarrhea or constipation.   C/o incontinence. C/o joint pain, swelling and limitation in mobility.Managed by pain Specialist C/o headaches,  numbness, and  tingling. C/o depression, anxiety oand insomnia. Denies skin break down or rash.   PE  BP 106/65   Pulse 65   Ht 5' (1.524 m)   Wt 169 lb 4 oz (76.8 kg)   SpO2 96%   BMI 33.05 kg/m   Patient alert and oriented and in no cardiopulmonary distress.  HEENT: No facial asymmetry, EOMI,     Neck decreased though adequate ROM .  Chest: Clear to  auscultation bilaterally.  CVS: S1, S2 no murmurs, no S3.Regular rate.  ABD: Soft non tender.   Ext: No edema  MS: decreased  ROM spine, shoulders, hips and knees.  Skin: Intact, no ulcerations or rash noted.  Psych: Good eye contact, normal affect. Memory intact not anxious or depressed appearing.  CNS: CN 2-12 intact, power,  normal throughout.no focal deficits noted.   Assessment & Plan  Recurrent falls unsteady gait with recurrent falls, neurology follows pt Has recently had PT for imbalance Home safety reviewed  [place]ant that she wants to live independently  Well controlled type 2 diabetes mellitus with gastroparesis ([place]) Managed by Endo and controlled [name] is reminded of the importance of commitment to daily physical activity for 30 minutes or more, as able and the need to limit carbohydrate intake to 30 to 60 grams per meal to help with blood sugar control.   The need to take medication as prescribed, test blood sugar as directed, and to call between visits if there is a concern that blood sugar is uncontrolled is also discussed.   [name] is reminded of the importance of daily foot exam, annual eye examination, and good blood sugar, blood pressure and cholesterol control.  Diabetic Labs Latest Ref Rng & Units 09/01/2020 07/29/2020 05/04/2020 03/25/2020 02/20/2020  HbA1c 4.0 - 5.6 % - 5.8(A) - 5.3 -  Microalbumin Not Estab. ug/mL - - - - -  Micro/Creat Ratio 0 - 29 mg/g creat - - - - -  Chol 100 -  199 mg/dL 128 - - - -  HDL >39 mg/dL 40 - - - -  Calc LDL 0 - 99 mg/dL 51 - - - -  Triglycerides 0 - 149 mg/dL 232(H) - - - -  Creatinine 0.57 - 1.00 mg/dL 1.05(H) - 1.13(H) - 1.50(H)  GFR >60.00 mL/min - - - - -   BP/Weight 10/16/2020 10/15/2020 09/22/2020 09/01/2020 08/10/2020 07/29/2020 0000000  Systolic BP 123XX123 A999333 Q000111Q [id number] 0000000 Q000111Q 0000000  Diastolic BP 59 65 52 72 66 68 67  Wt. (Lbs) 169 169.25 170.2 169 169 172.2 170  BMI 33.01 33.05 34.38 34.13 33.28 32.54 32.12  Some  encounter information is confidential and restricted. Go to Review Flowsheets activity to see all data.   Foot/eye exam completion dates Latest Ref Rng & Units 05/26/2020 01/01/2019  Eye Exam No Retinopathy No Retinopathy -  Foot exam Order - - -  Foot Form Completion - - Done        Hyperlipidemia Hyperlipidemia:Low fat diet discussed and encouraged.   Lipid Panel  Lab Results  Component Value Date   CHOL 128 09/01/2020   HDL 40 09/01/2020   LDLCALC 51 09/01/2020   TRIG 232 (H) 09/01/2020   CHOLHDL 3.2 09/01/2020  needs to reduce fat intake      Bipolar disorder ([place]) Reports increased depression currently, not suicidal or homicidal, also poor slepp and fatigue, Psych to address   Essential hypertension Controlled, no change in medication   Chronic pain syndrome Managed by pain clinic

## 2020-10-18 NOTE — Assessment & Plan Note (Signed)
Hyperlipidemia:Low fat diet discussed and encouraged.   Lipid Panel  Lab Results  Component Value Date   CHOL 128 09/01/2020   HDL 40 09/01/2020   LDLCALC 51 09/01/2020   TRIG 232 (H) 09/01/2020   CHOLHDL 3.2 09/01/2020  needs to reduce fat intake

## 2020-10-18 NOTE — Assessment & Plan Note (Signed)
Reports increased depression currently, not suicidal or homicidal, also poor slepp and fatigue, Psych to address

## 2020-10-19 ENCOUNTER — Telehealth: Payer: Self-pay

## 2020-10-19 ENCOUNTER — Telehealth: Payer: Self-pay | Admitting: Internal Medicine

## 2020-10-19 DIAGNOSIS — G4733 Obstructive sleep apnea (adult) (pediatric): Secondary | ICD-10-CM | POA: Diagnosis not present

## 2020-10-19 NOTE — Telephone Encounter (Signed)
Elta Guadeloupe the physical therapist called from [facility] to let Dr Moshe Cipro know patient fell on Thursday, 10/14/2020 went to ER at [facility] and sustained a concussion.

## 2020-10-19 NOTE — Telephone Encounter (Signed)
Pt stated that her CPAP machine is currently on the way to her now should be arriving this week and she had questions in regards to once it arrives and the different CPAP pressure settings that she will be on. Pls regard; [phone]

## 2020-10-19 NOTE — Telephone Encounter (Signed)
Call returned to patient, confirmed DOB. She reports her cpap machine has been shipped. She wanted to know what she needs to do once she gets the machine. I made her aware to go ahead and start using the machine once she receives and f/u with [name] at her September appt  to review her compliance report. Voiced understanding.   Nothing further needed at this time.

## 2020-10-20 ENCOUNTER — Other Ambulatory Visit: Payer: Self-pay | Admitting: Family Medicine

## 2020-10-20 ENCOUNTER — Other Ambulatory Visit: Payer: Self-pay

## 2020-10-20 ENCOUNTER — Encounter: Payer: Self-pay | Admitting: Internal Medicine

## 2020-10-20 ENCOUNTER — Ambulatory Visit (INDEPENDENT_AMBULATORY_CARE_PROVIDER_SITE_OTHER): Payer: Medicare HMO | Admitting: Psychiatry

## 2020-10-20 ENCOUNTER — Ambulatory Visit (INDEPENDENT_AMBULATORY_CARE_PROVIDER_SITE_OTHER): Payer: Medicare HMO | Admitting: Internal Medicine

## 2020-10-20 VITALS — BP 142/62 | HR 61 | Resp 18 | Ht 60.0 in | Wt 170.8 lb

## 2020-10-20 DIAGNOSIS — R42 Dizziness and giddiness: Secondary | ICD-10-CM | POA: Diagnosis not present

## 2020-10-20 DIAGNOSIS — F331 Major depressive disorder, recurrent, moderate: Secondary | ICD-10-CM

## 2020-10-20 DIAGNOSIS — Z09 Encounter for follow-up examination after completed treatment for conditions other than malignant neoplasm: Secondary | ICD-10-CM

## 2020-10-20 DIAGNOSIS — S0003XD Contusion of scalp, subsequent encounter: Secondary | ICD-10-CM | POA: Diagnosis not present

## 2020-10-20 DIAGNOSIS — R5381 Other malaise: Secondary | ICD-10-CM

## 2020-10-20 NOTE — Patient Instructions (Addendum)
Please take Meclizine as needed for dizziness.  Please avoid any sudden positional changes.  Please continue to participate in physical therapy.  Please take at least 64 ounces of fluid in a day.

## 2020-10-20 NOTE — Progress Notes (Signed)
Virtual Visit via Telephone Note  I connected with [name] on 10/20/20 at 4:14 PM EDT by telephone and verified that I am speaking with the correct person using two identifiers.  Location: Patient: Home Provider: [place] office    I discussed the limitations, risks, security and privacy concerns of performing an evaluation and management service by telephone and the availability of in person appointments. I also discussed with the patient that there may be a patient responsible charge related to this service. The patient expressed understanding and agreed to proceed.   I provided 46 minutes of non-face-to-face time during this encounter.   [name], LCSW              THERAPIST PROGRESS NOTE    Session Time: Wednesday 10/20/2020 4:14 PM -  5:05 PM  Participation Level: Active  Behavioral Response:  Less depressed, anxious, talkative, alert   Type of Therapy: Individual Therapy  Treatment Goals addressed: Patient wants to learn how to improve coping skills to manage chronic pain and health issues/ improve mood    Interventions: CBT and Supportive             Summary: [name] is a 70 y.o. female who presents with  long standing history of recurrent periods  of depression beginning when she was [age] and her favorite uncle died. Patient reports multiple psychiatric hospitlaizations due to depression and suicidal ideations with the last one occuring in 1997. Patient has participated in outpatient psychotherapy and medication management intermittently since age 32.  She currently is seeing psychiatrist Dr. Harrington Challenger . Prior to this, she was seen at [facility]. Patient also has had ECT at [facility]. Symptoms have worsened in recent months due to family stress and have  included depressed mood, anxiety, excessive worry, and tearfulness.         Patient's last contact was by virtual visit via telephone about 2-3  weeks ago.  She reports increased symptoms of  depression including depressed mood, irritability, rumination, and tearfulness since last session.  She reports stress related to recent incident where she sustained a concussion and a hematoma.  She had opened a top drawer looking for clothing and left it open while she was looking down to the floor or another drawer.  Per patient's report, the top drawer fell on her head.  She reports delay in notifying her PCP during her appointment as she feared she would be placed in a nursing home.  Couple of days later, she started experiencing pain and blurred vision.  She eventually went to the ED.  She reports increased rumination and sadness about her grandson as she has not heard from him since May 2022.  She reports noticing she was becoming more depressed but forced herself to attend church this past Sunday and said it was helpful.  However, she expresses frustration others are not more supportive and attentive.  Patient expresses some concern regarding her medication as she did not receive the usual amount of Ativan from the pharmacy she normally receives.  Therapist will forward information to [place].    Suicidal/Homicidal: Nowithout intent/plan      Therapist Response: Reviewed symptoms, discussed stressors, facilitated expression of thoughts and feelings validated feelings, assisted patient identify triggers of increased symptoms of depression, reviewed lapse versus relapse of depression, praised and reinforced patient's recognition of some of her depressive symptoms, praised and reinforced patient's efforts to use strategies to intervene, continued to identify early warning  signs of depression, reviewed distracting activities to help cope with anxiety.   Diagnosis: Axis I: MDD, Recurrent, moderate    Axis II: Deferred    Coury Grieger E Danity Schmelzer, LCSW

## 2020-10-20 NOTE — Progress Notes (Signed)
Established Patient Office Visit  Subjective:  Patient ID: [name], female    DOB: [birth year]  Age: 70 y.o. MRN: [id number]  CC:  Chief Complaint  Patient presents with   Follow-up    ER follow up pt had a fall 10-14-20 she hit her head and shoulder     HPI [name] presents for evaluation after a recent ER visit after a fall at home. She states that one of the drawers of the dresser table fell on her head while she was fixing the bottom drawer. She tried to get up from the position and was having balance issues and had another fall. She had left sided scalp hematoma noted in CT head. She also c/o chronic neck and shoulder pain, for which she takes pain medications.  She has been having dizziness for last few months and also reports balance issues. Denies any tremors, seizures. Does not skip any meals.  She has home health aide and participates in PT at daytime. She does not have any support at nighttime.  Past Medical History:  Diagnosis Date   Allergy    Anemia    Anxiety    takes Ativan daily   Arthritis    Assistance needed for mobility    Bipolar disorder ([place])    takes Risperdal nightly   Blood transfusion    Brain tumor ([place])    Cancer ([place])    In her gum   Carpal tunnel syndrome of right wrist 05/23/2011   Cervical disc disorder with radiculopathy of cervical region 10/31/2012   Chronic back pain    Chronic idiopathic constipation    Chronic neck and back pain    Colon polyps    COPD (chronic obstructive pulmonary disease) with chronic bronchitis ([place]) 09/16/2013   Office Spirometry 10/30/2013-submaximal effort based on appearance of loop and curve. Numbers would fit with severe restriction but her physiologic capability may be better than this. FVC 0.91/44%, and 10.74/45%, FEV1/FVC 0.81, FEF 25-75% 1.43/69%     Diabetes mellitus    Type II   Diverticulosis    TCS 9/08 by Dr. Delfin Edis for diarrhea . Bx for micro scopic colitis negative.    Fibromyalgia     Frequent falls    GERD (gastroesophageal reflux disease)    takes Aciphex daily   Glaucoma    eye drops daily   Gum symptoms    infection on antibiotic   Hemiplegia affecting non-dominant side, post-stroke 08/02/2011   Hiatal hernia    Hyperlipidemia    takes Crestor daily   Hypertension    takes Amlodipine,Metoprolol,and Clonidine daily   Hypothyroidism    takes Synthroid daily   IBS (irritable bowel syndrome)    Insomnia    takes Trazodone nightly   Major depression, recurrent ([place])    takes Zoloft daily   Malignant hyperpyrexia 06/19/8117   Metabolic encephalopathy 1/47/8295   Migraines    chronic headaches   Mononeuritis lower limb    Narcolepsy    Osteoporosis    Pancreatitis 2006   due to Depakote with normal EUS    Schatzki's ring    non critical / EGD with ED 8/2011with RMR   Seizures ([place])    takes Lamictal daily.Last seizure 3 yrs ago   Sleep apnea    on CPAP   Small bowel obstruction (H[facility])    Stroke ([place])    left sided weakness, speech changes   Tubular adenoma of colon     Past Surgical  History:  Procedure Laterality Date   ABDOMINAL HYSTERECTOMY  1978   BACK SURGERY  July 2012   BACTERIAL OVERGROWTH TEST N/A 05/05/2013   Procedure: BACTERIAL OVERGROWTH TEST;  Surgeon: Daneil Dolin, MD;  [facility];  Service: Endoscopy;  Laterality: N/A;  7:30   BIOPSY THYROID  2009   BRAIN SURGERY  11/2011   resection of meningioma   BREAST REDUCTION SURGERY  1994   CARDIAC CATHETERIZATION  05/10/2005   normal coronaries, normal LV systolic function and EF (Dr. Jackie Plum)   [place] Left 07/22/04   Dr. Aline Brochure   CATARACT EXTRACTION Bilateral    CHOLECYSTECTOMY  1984   COLONOSCOPY N/A 09/25/2012   FOY:DXAJOIN diverticulosis.  colonic polyp-removed : tubular adenoma   CRANIOTOMY  11/23/2011   Procedure: CRANIOTOMY TUMOR EXCISION;  Surgeon: Hosie Spangle, MD;  Location: [facility];  Service: Neurosurgery;  Laterality: N/A;  Craniotomy  for tumor resection   ESOPHAGOGASTRODUODENOSCOPY  12/29/2010   [place]-Retained food in the esophagus and stomach, small hiatal hernia, status post Maloney dilation of the esophagus   ESOPHAGOGASTRODUODENOSCOPY N/A 09/25/2012   OMV:EHMCNOBS atonic baggy esophagus status post Maloney dilation 61 F. Hiatal hernia   GIVENS CAPSULE STUDY N/A 01/15/2013   NORMAL.    IR GENERIC HISTORICAL  03/17/2016   IR RADIOLOGIST EVAL & MGMT 03/17/2016 [facility]   LESION REMOVAL N/A 05/31/2015   Procedure: REMOVAL RIGHT AND LEFT LESIONS OF MANDIBLE;  Surgeon: Diona Browner, DDS;  Location: [place];  Service: Oral Surgery;  Laterality: N/A;   MALONEY DILATION  12/29/2010   RMR;   NM MYOCAR PERF WALL MOTION  2006   "relavtiely normal" persantine, mild anterior thinning (breast attenuation artifact), no region of scar/ischemia   OVARIAN CYST REMOVAL     RECTOCELE REPAIR N/A 06/29/2015   Procedure: POSTERIOR REPAIR (RECTOCELE);  Surgeon: Jonnie Kind, MD;  Location: [facility];  Service: Gynecology;  Laterality: N/A;   REDUCTION MAMMAPLASTY Bilateral    SPINE SURGERY  09/29/2010   Dr. Rolena Infante   surgical excision of 3 tumors from right thigh and right buttock  and left upper thigh  2010   TOOTH EXTRACTION Bilateral 12/14/2014   Procedure: REMOVAL OF BILATERAL MANDIBULAR EXOSTOSES;  Surgeon: Diona Browner, DDS;  Location: [facility];  Service: Oral Surgery;  Laterality: Bilateral;   TRANSTHORACIC ECHOCARDIOGRAM  2010   EF 60-65%, mild conc LVH, grade 1 diastolic dysfunction; mildly calcified MV annulus with mildly thickened leaflets, mildly calcified MR annulus    Family History  Problem Relation Age of Onset   Heart attack Mother        HTN   Pneumonia Father    Kidney failure Father    Diabetes Father    Pancreatic cancer Sister    Diabetes Brother    Hypertension Brother    Diabetes Brother    Cancer Sister        breast    Hypertension Son    Sleep apnea Son    Cancer Sister        pancreatic   Stroke Maternal  Grandmother    Heart attack Maternal Grandfather    Alcohol abuse Maternal Uncle    Colon cancer Neg Hx    Anesthesia problems Neg Hx    Hypotension Neg Hx    Malignant hyperthermia Neg Hx    Pseudochol deficiency Neg Hx    Breast cancer Neg Hx     Social History   Socioeconomic History  Marital status: Divorced    Spouse name: Not on file   Number of children: 1   Years of education: 12   Highest education level: High school graduate  Occupational History   Occupation: Disabled  Tobacco Use   Smoking status: Former    Packs/day: 0.50    Years: 30.00    Pack years: 15.00    Types: Cigarettes    Quit date: 06/11/2020    Years since quitting: 0.3   Smokeless tobacco: Never   Tobacco comments:    quit 7 weeks ago-07/14/2020-AH   Vaping Use   Vaping Use: Never used  Substance and Sexual Activity   Alcohol use: No    Alcohol/week: 0.0 standard drinks    Comment:     Drug use: No   Sexual activity: Not Currently  Other Topics Concern   Not on file  Social History Narrative   01/29/18 Lives alone, has 3 aides, Mon- Fri 8 hrs, 2 hrs on Sat-Sun, RN manages her meds   Caffeine use: Drink coffee sometimes    Right handed    Social Determinants of Radio broadcast assistant Strain: Low Risk    Difficulty of Paying Living Expenses: Not very hard  Food Insecurity: No Food Insecurity   Worried About Charity fundraiser in the Last Year: Never true   [place] in the Last Year: Never true  Transportation Needs: No Transportation Needs   Lack of Transportation (Medical): No   Lack of Transportation (Non-Medical): No  Physical Activity: Inactive   Days of Exercise per Week: 0 days   Minutes of Exercise per Session: 0 min  Stress: Not on file  Social Connections: Moderately Isolated   Frequency of Communication with Friends and Family: Three times a week   Frequency of Social Gatherings with Friends and Family: Twice a week   Attends Religious Services: More than 4  times per year   Active Member of [employer] or Organizations: No   Attends Music therapist: Never   Marital Status: Divorced  Human resources officer Violence: Not on file    Outpatient Medications Prior to Visit  Medication Sig Dispense Refill   Accu-Chek Softclix Lancets lancets TEST BLOOD SUGAR THREE TIMES DAILY AS DIRECTED 300 each 2   albuterol (VENTOLIN HFA) 108 (90 Base) MCG/ACT inhaler INHALE 1 TO 2 PUFFS EVERY 6 HOURS AS NEEDED FOR WHEEZING, SHORTNESS OF BREATH 54 each 1   Alcohol Swabs (B-D SINGLE USE SWABS REGULAR) PADS USE TO CLEAN FINGER PRIOR TO STICKING FOR BLOOD SUGAR. 100 each 11   alendronate (FOSAMAX) 70 MG tablet TAKE 1 TABLET (70 MG TOTAL) BY MOUTH EVERY 7 (SEVEN) DAYS. TAKE WITH A FULL GLASS OF WATER ON AN EMPTY STOMACH. 12 tablet 3   amLODipine (NORVASC) 10 MG tablet TAKE 1 TABLET EVERY DAY 90 tablet 3   ascorbic acid (VITAMIN C) 500 MG tablet Take 500 mg by mouth daily.     aspirin EC 81 MG tablet Take 1 tablet (81 mg total) by mouth daily with breakfast. 120 tablet 2   betamethasone dipropionate 0.05 % cream Apply topically 2 (two) times daily as needed (Rash). 45 g 3   Blood Glucose Calibration (ACCU-CHEK GUIDE CONTROL) LIQD USE PRN TO CALIBRATE GLUCOMETER 3 each 11   blood glucose meter kit and supplies Dispense based on patient and insurance preference. Use up to four times daily as directed. (FOR ICD-10 E10.9, E11.9). 1 each 0   buPROPion (WELLBUTRIN XL) 150  MG 24 hr tablet Take 1 tablet (150 mg total) by mouth every morning. 90 tablet 2   cetirizine (ZYRTEC) 10 MG tablet Take 1 tablet (10 mg total) by mouth daily. 7 tablet 0   clobetasol cream (TEMOVATE) 7.90 % Apply 1 application topically 2 (two) times daily.      Continuous Blood Gluc Sensor (FREESTYLE LIBRE 14 DAY SENSOR) MISC 1 each by Does not apply route every 14 (fourteen) days. Change every 2 weeks 2 each 11   diclofenac Sodium (VOLTAREN) 1 % GEL APPLY 2 GRAMS  TOPICALLY 4 (FOUR) TIMES DAILY. 800 g 0    ezetimibe (ZETIA) 10 MG tablet Take 1 tablet (10 mg total) by mouth daily. 90 tablet 1   famotidine (PEPCID) 40 MG tablet Take 40 mg by mouth 2 (two) times daily.     glucose blood (ACCU-CHEK GUIDE) test strip USE TO CHECK BLOOD SUGAR FOUR TIMES A DAY AND PRN 400 each 11   hydrOXYzine (ATARAX/VISTARIL) 10 MG tablet Take 1 tablet (10 mg total) by mouth 3 (three) times daily as needed. 30 tablet 0   insulin aspart (NOVOLOG FLEXPEN) 100 UNIT/ML FlexPen Inject 16 to 18 units under skin before meals up to 3 times a day 45 mL 3   Insulin Pen Needle (SURE COMFORT PEN NEEDLES) 31G X 8 MM MISC USE TO for injecting insulin 4 TIMES DAILY. 200 each 3   lamoTRIgine (LAMICTAL) 100 MG tablet Take 1 tablet (100 mg total) by mouth 2 (two) times daily. 180 tablet 2   levothyroxine (SYNTHROID) 50 MCG tablet Take 1 tablet (50 mcg) 6 days 1/2 tablet (25 mcg) 1 day weekly. 80 tablet 3   LORazepam (ATIVAN) 0.5 MG tablet Take 1 tablet (0.5 mg total) by mouth 2 (two) times daily. 180 tablet 1   losartan (COZAAR) 50 MG tablet TAKE 1 TABLET EVERY DAY 90 tablet 1   meclizine (ANTIVERT) 25 MG tablet Take 1 tablet (25 mg total) by mouth 3 (three) times daily as needed for dizziness. 30 tablet 0   methocarbamol (ROBAXIN) 500 MG tablet Take 1 tablet (500 mg total) by mouth 2 (two) times daily as needed for muscle spasms. 20 tablet 0   metoprolol tartrate (LOPRESSOR) 50 MG tablet TAKE 1 TABLET TWICE DAILY (NEED MD APPOINTMENT) 180 tablet 3   montelukast (SINGULAIR) 10 MG tablet TAKE 1 TABLET EVERY DAY 90 tablet 1   nystatin (MYCOSTATIN/NYSTOP) powder APPLY TO AFFECTED AREA 4 TIMES DAILY. 90 g 1   Omega-3 Fatty Acids (FISH OIL PO) Take 1 capsule by mouth daily.     oxyCODONE (OXY IR/ROXICODONE) 5 MG immediate release tablet Take 5 mg by mouth every 6 (six) hours as needed.     Plecanatide (TRULANCE) 3 MG TABS Take 3 mg by mouth daily as needed. 30 tablet 0   pregabalin (LYRICA) 75 MG capsule Take 1 capsule (75 mg total) by mouth  daily.     RABEprazole (ACIPHEX) 20 MG tablet TAKE 1 TABLET TWICE DAILY (MUST KEEP 07/2020 APPT FOR FURTHER REFILLS) 180 tablet 0   RESTASIS 0.05 % ophthalmic emulsion      risperiDONE (RISPERDAL) 0.5 MG tablet Take 1 tablet (0.5 mg total) by mouth at bedtime. 90 tablet 2   rosuvastatin (CRESTOR) 5 MG tablet TAKE 1 TABLET AT BEDTIME 90 tablet 2   sertraline (ZOLOFT) 100 MG tablet Take 1 tablet (100 mg total) by mouth daily. 90 tablet 2   Sodium Sulfate-Mag Sulfate-KCl (SUTAB) [phone] MG TABS Use as directed for  colonoscopy. MANUFACTURER CODES!! BIN: [id number] PCN: CN GROUP: [id number] MEMBER ID: [id number];[employer] AS SECONDARY INSURANCE ;NO PRIOR AUTHORIZATION 24 tablet 0   STIOLTO RESPIMAT 2.5-2.5 MCG/ACT AERS INHALE 2 PUFFS INTO THE LUNGS DAILY. 12 g 1   TOUJEO MAX SOLOSTAR 300 UNIT/ML Solostar Pen INJECT 24 UNITS SUBCUTANEOUSLY INTO THE SKIN AT BEDTIME 12 mL 3   traZODone (DESYREL) 100 MG tablet Take 1 tablet (100 mg total) by mouth at bedtime. 90 tablet 2   triamcinolone (KENALOG) 0.1 % Apply 1 application topically daily. 15 g 1   UNABLE TO [facility] bed mattress x 1  DX: G47.33, J44.9 1 each 0   UNABLE TO FIND Standard wheelchair Dx M47.16, R29.898 1 each 0   No facility-administered medications prior to visit.    Allergies  Allergen Reactions   Lac Bovis Rash    Doesn't agree with stomach.    Phenazopyridine Hcl Hives   Cephalexin Hives   Flonase [Fluticasone]     "It gave me ulcers in my nose"   Iron Nausea And Vomiting   Milk-Related Compounds Other (See Comments)    Doesn't agree with stomach.    Penicillins Hives    Has patient had a PCN reaction causing immediate rash, facial/tongue/throat swelling, SOB or lightheadedness with hypotension: Yes Has patient had a PCN reaction causing severe rash involving mucus membranes or skin necrosis: No Has patient had a PCN reaction that required hospitalization No Has patient had a PCN reaction occurring within the last 10 years:  No If all of the above answers are "NO", then may proceed with Cephalosporin use.    Phenazopyridine Hcl Hives          ROS Review of Systems  Constitutional:  Positive for fatigue. Negative for chills and fever.  HENT:  Negative for congestion, sinus pressure and sinus pain.   Eyes:  Positive for visual disturbance. Negative for pain and discharge.  Respiratory:  Negative for cough and shortness of breath.   Cardiovascular:  Negative for chest pain and palpitations.  Gastrointestinal:  Negative for diarrhea and vomiting.  Genitourinary:  Negative for dysuria and hematuria.  Musculoskeletal:  Positive for arthralgias, gait problem and neck pain. Negative for neck stiffness.  Skin:  Negative for rash.  Neurological:  Positive for dizziness, weakness and headaches. Negative for seizures and numbness.  Psychiatric/Behavioral:  Positive for sleep disturbance. Negative for agitation and behavioral problems. The patient is nervous/anxious.      Objective:    Physical Exam Constitutional:      General: She is not in acute distress.    Appearance: She is not diaphoretic.  HENT:     Head: Normocephalic.     Comments: Left occipital area hematoma (bulging) about 2 cm in diameter    Nose: No congestion.     Mouth/Throat:     Mouth: Mucous membranes are dry.  Eyes:     General: No scleral icterus.    Extraocular Movements: Extraocular movements intact.  Cardiovascular:     Rate and Rhythm: Normal rate and regular rhythm.     Pulses: Normal pulses.     Heart sounds: Normal heart sounds. No murmur heard. Pulmonary:     Breath sounds: Normal breath sounds. No wheezing or rales.  Skin:    General: Skin is dry.     Findings: No rash.  Neurological:     General: No focal deficit present.     Mental Status: She is alert and oriented to person, place, and time.  Sensory: No sensory deficit.     Motor: No weakness.  Psychiatric:        Mood and Affect: Mood normal.        Behavior:  Behavior normal.    BP (!) 142/62 (BP Location: Left Arm, Patient Position: Sitting, Cuff Size: Normal)   Pulse 61   Resp 18   Ht 5' (1.524 m)   Wt 170 lb 12.8 oz (77.5 kg)   SpO2 97%   BMI 33.36 kg/m  Wt Readings from Last 3 Encounters:  10/20/20 170 lb 12.8 oz (77.5 kg)  10/16/20 169 lb (76.7 kg)  10/15/20 169 lb 4 oz (76.8 kg)     Health Maintenance Due  Topic Date Due   COVID-19 Vaccine (4 - Booster for Moderna series) 05/16/2020   INFLUENZA VACCINE  10/11/2020    There are no preventive care reminders to display for this patient.  Lab Results  Component Value Date   TSH 0.631 12/11/2019   Lab Results  Component Value Date   WBC 6.2 09/15/2020   HGB 10.6 (L) 09/15/2020   HCT 36.5 09/15/2020   MCV 78.5 (L) 09/15/2020   PLT 216 09/15/2020   Lab Results  Component Value Date   NA 144 09/01/2020   K 4.7 09/01/2020   CO2 24 09/01/2020   GLUCOSE 93 09/01/2020   BUN 26 09/01/2020   CREATININE 1.05 (H) 09/01/2020   BILITOT 0.2 09/01/2020   ALKPHOS 97 09/01/2020   AST 27 09/01/2020   ALT 27 09/01/2020   PROT 7.1 09/01/2020   ALBUMIN 4.7 09/01/2020   CALCIUM 9.6 09/01/2020   ANIONGAP 8 05/04/2020   EGFR 57 (L) 09/01/2020   GFR 21.94 (L) 11/03/2014   Lab Results  Component Value Date   CHOL 128 09/01/2020   Lab Results  Component Value Date   HDL 40 09/01/2020   Lab Results  Component Value Date   LDLCALC 51 09/01/2020   Lab Results  Component Value Date   TRIG 232 (H) 09/01/2020   Lab Results  Component Value Date   CHOLHDL 3.2 09/01/2020   Lab Results  Component Value Date   HGBA1C 5.8 (A) 07/29/2020      Assessment & Plan:   Problem List Items Addressed This Visit       Other Visit Diagnoses     Encounter for examination following treatment at hospital    -  Primary ER chart reviewed including imaging Medications reviewed with the patient    Scalp hematoma, subsequent encounter     Appears to be improving Has been applying  ice to neck area to help with pain and swelling    Physical deconditioning/dizziness Meclizine PRN for dizziness Appears to be dehydrated as well, needs to increase fluid intake Referred for home PT for vestibular exercises Avoid sudden positional changes Fall precautions discussed       Relevant Orders   Ambulatory referral to [place] [place]       No orders of the defined types were placed in this encounter.   Follow-up: Return if symptoms worsen or fail to improve.    Lindell Spar, MD

## 2020-10-20 NOTE — Patient Outreach (Addendum)
[facility]) Care Management  10/20/2020  Kimore Blash Boulos [birth year] [id number]   Telephone call to patient for disease management follow up.   No answer.  Unable to leave a message.   Plan: If no return call, RN CM will attempt patient again in November.    Jone Baseman, RN, MSN [facility] Management Care Management Coordinator Direct Line [phone] Cell [phone] Toll Free: [phone]  Fax: [phone]

## 2020-10-22 ENCOUNTER — Telehealth (HOSPITAL_COMMUNITY): Payer: Self-pay | Admitting: *Deleted

## 2020-10-22 ENCOUNTER — Telehealth: Payer: Self-pay

## 2020-10-22 DIAGNOSIS — F17210 Nicotine dependence, cigarettes, uncomplicated: Secondary | ICD-10-CM | POA: Diagnosis not present

## 2020-10-22 DIAGNOSIS — J449 Chronic obstructive pulmonary disease, unspecified: Secondary | ICD-10-CM | POA: Diagnosis not present

## 2020-10-22 DIAGNOSIS — E1142 Type 2 diabetes mellitus with diabetic polyneuropathy: Secondary | ICD-10-CM | POA: Diagnosis not present

## 2020-10-22 DIAGNOSIS — J42 Unspecified chronic bronchitis: Secondary | ICD-10-CM | POA: Diagnosis not present

## 2020-10-22 DIAGNOSIS — R2681 Unsteadiness on feet: Secondary | ICD-10-CM | POA: Diagnosis not present

## 2020-10-22 DIAGNOSIS — D649 Anemia, unspecified: Secondary | ICD-10-CM | POA: Diagnosis not present

## 2020-10-22 DIAGNOSIS — M160 Bilateral primary osteoarthritis of hip: Secondary | ICD-10-CM | POA: Diagnosis not present

## 2020-10-22 DIAGNOSIS — E039 Hypothyroidism, unspecified: Secondary | ICD-10-CM | POA: Diagnosis not present

## 2020-10-22 DIAGNOSIS — F250 Schizoaffective disorder, bipolar type: Secondary | ICD-10-CM | POA: Diagnosis not present

## 2020-10-22 NOTE — Telephone Encounter (Signed)
Anderson Malta from [facility] called they can not take this referral, patient insurance HMO is out of network.  Questions call back # [phone].

## 2020-10-22 NOTE — Telephone Encounter (Signed)
Patient called stating that her pharmacy will now start sending her 60 tabs (66mh) at a time of her Lorazepam (Ativan). Per pt they told her it's due to this med being a controlled substance.   Staff asked patient if there's something that provider could help her with and she stated no she was just letting provider know. Staff informed patient that message will be provided to provider.

## 2020-10-22 NOTE — Telephone Encounter (Signed)
Noted thanks °

## 2020-10-25 ENCOUNTER — Telehealth: Payer: Self-pay

## 2020-10-25 DIAGNOSIS — D649 Anemia, unspecified: Secondary | ICD-10-CM | POA: Diagnosis not present

## 2020-10-25 DIAGNOSIS — J449 Chronic obstructive pulmonary disease, unspecified: Secondary | ICD-10-CM | POA: Diagnosis not present

## 2020-10-25 DIAGNOSIS — E1142 Type 2 diabetes mellitus with diabetic polyneuropathy: Secondary | ICD-10-CM | POA: Diagnosis not present

## 2020-10-25 DIAGNOSIS — J42 Unspecified chronic bronchitis: Secondary | ICD-10-CM | POA: Diagnosis not present

## 2020-10-25 DIAGNOSIS — E039 Hypothyroidism, unspecified: Secondary | ICD-10-CM | POA: Diagnosis not present

## 2020-10-25 DIAGNOSIS — R2681 Unsteadiness on feet: Secondary | ICD-10-CM | POA: Diagnosis not present

## 2020-10-25 DIAGNOSIS — M160 Bilateral primary osteoarthritis of hip: Secondary | ICD-10-CM | POA: Diagnosis not present

## 2020-10-25 DIAGNOSIS — F250 Schizoaffective disorder, bipolar type: Secondary | ICD-10-CM | POA: Diagnosis not present

## 2020-10-25 DIAGNOSIS — F17210 Nicotine dependence, cigarettes, uncomplicated: Secondary | ICD-10-CM | POA: Diagnosis not present

## 2020-10-25 NOTE — Telephone Encounter (Signed)
[place] Physical therapist with [facility] is calling wanting orders to extend PT 1 WK 5  call back [phone]

## 2020-10-25 NOTE — Telephone Encounter (Signed)
Patient was seen in office 08.10.2022.

## 2020-10-25 NOTE — Telephone Encounter (Signed)
[name] given verbal orders

## 2020-10-26 DIAGNOSIS — M4716 Other spondylosis with myelopathy, lumbar region: Secondary | ICD-10-CM | POA: Diagnosis not present

## 2020-10-26 DIAGNOSIS — R29898 Other symptoms and signs involving the musculoskeletal system: Secondary | ICD-10-CM | POA: Diagnosis not present

## 2020-10-28 ENCOUNTER — Ambulatory Visit: Payer: Medicare HMO | Admitting: Adult Health

## 2020-10-28 ENCOUNTER — Encounter (HOSPITAL_COMMUNITY): Payer: Self-pay | Admitting: Internal Medicine

## 2020-11-01 ENCOUNTER — Telehealth: Payer: Self-pay

## 2020-11-01 ENCOUNTER — Other Ambulatory Visit (HOSPITAL_COMMUNITY): Payer: Self-pay | Admitting: Psychiatry

## 2020-11-01 ENCOUNTER — Other Ambulatory Visit: Payer: Self-pay

## 2020-11-01 ENCOUNTER — Telehealth (HOSPITAL_COMMUNITY): Payer: Self-pay | Admitting: *Deleted

## 2020-11-01 DIAGNOSIS — I83893 Varicose veins of bilateral lower extremities with other complications: Secondary | ICD-10-CM

## 2020-11-01 DIAGNOSIS — J42 Unspecified chronic bronchitis: Secondary | ICD-10-CM | POA: Diagnosis not present

## 2020-11-01 DIAGNOSIS — F17210 Nicotine dependence, cigarettes, uncomplicated: Secondary | ICD-10-CM | POA: Diagnosis not present

## 2020-11-01 DIAGNOSIS — E039 Hypothyroidism, unspecified: Secondary | ICD-10-CM | POA: Diagnosis not present

## 2020-11-01 DIAGNOSIS — F250 Schizoaffective disorder, bipolar type: Secondary | ICD-10-CM | POA: Diagnosis not present

## 2020-11-01 DIAGNOSIS — E1142 Type 2 diabetes mellitus with diabetic polyneuropathy: Secondary | ICD-10-CM | POA: Diagnosis not present

## 2020-11-01 DIAGNOSIS — M160 Bilateral primary osteoarthritis of hip: Secondary | ICD-10-CM | POA: Diagnosis not present

## 2020-11-01 DIAGNOSIS — J449 Chronic obstructive pulmonary disease, unspecified: Secondary | ICD-10-CM | POA: Diagnosis not present

## 2020-11-01 DIAGNOSIS — R2681 Unsteadiness on feet: Secondary | ICD-10-CM | POA: Diagnosis not present

## 2020-11-01 DIAGNOSIS — D649 Anemia, unspecified: Secondary | ICD-10-CM | POA: Diagnosis not present

## 2020-11-01 MED ORDER — LORAZEPAM 0.5 MG PO TABS: 0.5000 mg | ORAL_TABLET | Freq: Two times a day (BID) | ORAL | 2 refills | Status: DC

## 2020-11-01 NOTE — Telephone Encounter (Signed)
sent 

## 2020-11-01 NOTE — Telephone Encounter (Signed)
[name] with [facility] nurse called.   Per [name] they fill patient pill box for patient every 2 weeks.   Per [name], patient gets her med from [name] and lately there's been issues with running out of her Ativan before it gets to her via mail.   Per pt nurse and patient, she would would like ONLY her Ativan to be sent to [name] and the rest can go to [name] order.   Per patient due to the Ativan being a 30 days supply, she just want it local

## 2020-11-01 NOTE — Telephone Encounter (Signed)
Please call her about her breathing machine

## 2020-11-02 ENCOUNTER — Telehealth: Payer: Self-pay | Admitting: Internal Medicine

## 2020-11-02 ENCOUNTER — Telehealth: Payer: Self-pay

## 2020-11-02 NOTE — Telephone Encounter (Signed)
Patient called needs for nurse to give her a call concern about medicine she is taking with the concussion she had and still has a knot on her head. Call back # [phone].

## 2020-11-02 NOTE — Telephone Encounter (Signed)
Spoke with pt regarding recent Concussion. Pt stated that she had no symptoms. Pt was informed to proceed with Endo/colon as scheduled.

## 2020-11-02 NOTE — Telephone Encounter (Signed)
Left message to call back  

## 2020-11-02 NOTE — Telephone Encounter (Signed)
Pt had a concussion at the beginning of this month, she was seen at [facility] ED. Pt is scheduled for endocolon on 11/04/20 with Dr. Hilarie Fredrickson. She wants to know if she needs to r/s.

## 2020-11-03 ENCOUNTER — Ambulatory Visit (HOSPITAL_COMMUNITY): Payer: Medicare HMO | Admitting: Psychiatry

## 2020-11-03 ENCOUNTER — Telehealth: Payer: Self-pay | Admitting: Internal Medicine

## 2020-11-03 NOTE — Telephone Encounter (Signed)
ATC Patient.  Left detailed message (DPR) on VM, with [facility] contact information to call for cpap set up questions.

## 2020-11-04 ENCOUNTER — Encounter: Payer: Self-pay | Admitting: Internal Medicine

## 2020-11-04 ENCOUNTER — Other Ambulatory Visit: Payer: Self-pay

## 2020-11-04 ENCOUNTER — Ambulatory Visit (AMBULATORY_SURGERY_CENTER): Payer: Medicare HMO | Admitting: Internal Medicine

## 2020-11-04 VITALS — BP 181/77 | HR 76 | Temp 96.8°F | Resp 24 | Ht 59.0 in | Wt 169.0 lb

## 2020-11-04 DIAGNOSIS — K5909 Other constipation: Secondary | ICD-10-CM

## 2020-11-04 DIAGNOSIS — R110 Nausea: Secondary | ICD-10-CM | POA: Diagnosis not present

## 2020-11-04 DIAGNOSIS — K319 Disease of stomach and duodenum, unspecified: Secondary | ICD-10-CM | POA: Diagnosis not present

## 2020-11-04 DIAGNOSIS — K219 Gastro-esophageal reflux disease without esophagitis: Secondary | ICD-10-CM | POA: Diagnosis not present

## 2020-11-04 DIAGNOSIS — R109 Unspecified abdominal pain: Secondary | ICD-10-CM | POA: Diagnosis not present

## 2020-11-04 DIAGNOSIS — Z86010 Personal history of colonic polyps: Secondary | ICD-10-CM | POA: Diagnosis not present

## 2020-11-04 DIAGNOSIS — K2970 Gastritis, unspecified, without bleeding: Secondary | ICD-10-CM | POA: Diagnosis not present

## 2020-11-04 DIAGNOSIS — Z1211 Encounter for screening for malignant neoplasm of colon: Secondary | ICD-10-CM | POA: Diagnosis not present

## 2020-11-04 DIAGNOSIS — R140 Abdominal distension (gaseous): Secondary | ICD-10-CM

## 2020-11-04 MED ORDER — SODIUM CHLORIDE 0.9 % IV SOLN: 500.0000 mL | Freq: Once | INTRAVENOUS | Status: DC

## 2020-11-04 NOTE — Progress Notes (Signed)
Called to room to assist during endoscopic procedure.  Patient ID and intended procedure confirmed with present staff. Received instructions for my participation in the procedure from the performing physician.  

## 2020-11-04 NOTE — Progress Notes (Signed)
Pt reports lower and upper back pain as well as left shoulder pain.  She states that she needs to have rotator cuff surgery but "has been unable to".  She denies any recent seizures or heart problems.  Also denies any recent shortness of breath.  No recent chest pain or heart arrhythmias.

## 2020-11-04 NOTE — Op Note (Signed)
[facility] Patient Name: [name] Procedure Date: 11/04/2020 10:34 AM MRN: [id number] Endoscopist: Jerene Bears , MD Age: 70 Referring MD:  Date of Birth: 02-Apr-1950 Gender: Female Account #: [id number] Procedure:                Colonoscopy Indications:              High risk colon cancer surveillance: Personal                            history of non-advanced adenoma, Last colonoscopy:                            November 2017 Medicines:                Monitored Anesthesia Care Procedure:                Pre-Anesthesia Assessment:                           - Prior to the procedure, a History and Physical                            was performed, and patient medications and                            allergies were reviewed. The patient's tolerance of                            previous anesthesia was also reviewed. The risks                            and benefits of the procedure and the sedation                            options and risks were discussed with the patient.                            All questions were answered, and informed consent                            was obtained. Prior Anticoagulants: The patient has                            taken no previous anticoagulant or antiplatelet                            agents. ASA Grade Assessment: III - A patient with                            severe systemic disease. After reviewing the risks                            and benefits, the patient was deemed in  satisfactory condition to undergo the procedure.                           After obtaining informed consent, the colonoscope                            was passed under direct vision. Throughout the                            procedure, the patient's blood pressure, pulse, and                            oxygen saturations were monitored continuously. The                            Olympus [id number] Colonoscope was                             introduced through the anus and advanced to the                            cecum, identified by appendiceal orifice and                            ileocecal valve. The colonoscopy was performed                            without difficulty. The patient tolerated the                            procedure well. The quality of the bowel                            preparation was good. The ileocecal valve,                            appendiceal orifice, and rectum were photographed. Scope In: 11:06:39 AM Scope Out: 11:20:54 AM Scope Withdrawal Time: 0 hours 9 minutes 25 seconds  Total Procedure Duration: 0 hours 14 minutes 15 seconds  Findings:                 The digital rectal exam was normal.                           Multiple small and large-mouthed diverticula were                            found in the sigmoid colon, descending colon,                            hepatic flexure and ascending colon.                           The exam was otherwise without abnormality on  direct and retroflexion views. Complications:            No immediate complications. Estimated Blood Loss:     Estimated blood loss: none. Impression:               - Diverticulosis in the sigmoid colon, in the                            descending colon, at the hepatic flexure and in the                            ascending colon.                           - The examination was otherwise normal on direct                            and retroflexion views.                           - No specimens collected. Recommendation:           - Patient has a contact number available for                            emergencies. The signs and symptoms of potential                            delayed complications were discussed with the                            patient. Return to normal activities tomorrow.                            Written discharge instructions were provided to  the                            patient.                           - Resume previous diet.                           - Continue present medications.                           - No repeat colonoscopy due to age at next                            surveillance interval (10 years) and the absence of                            colonic polyps. Jerene Bears, MD 11/04/2020 11:34:40 AM This report has been signed electronically.

## 2020-11-04 NOTE — Op Note (Signed)
[name] Procedure Date: 11/04/2020 10:42 AM MRN: [id number] Endoscopist: Jerene Bears , MD Age: 70 Referring MD:  Date of Birth: 22-Apr-1950 Gender: Female Account #: [id number] Procedure:                Upper GI endoscopy Indications:              Gastro-esophageal reflux disease, Abdominal                            bloating, Nausea Medicines:                Monitored Anesthesia Care Procedure:                Pre-Anesthesia Assessment:                           - Prior to the procedure, a History and Physical                            was performed, and patient medications and                            allergies were reviewed. The patient's tolerance of                            previous anesthesia was also reviewed. The risks                            and benefits of the procedure and the sedation                            options and risks were discussed with the patient.                            All questions were answered, and informed consent                            was obtained. Prior Anticoagulants: The patient has                            taken no previous anticoagulant or antiplatelet                            agents. ASA Grade Assessment: III - A patient with                            severe systemic disease. After reviewing the risks                            and benefits, the patient was deemed in                            satisfactory condition to undergo the procedure.  After obtaining informed consent, the endoscope was                            passed under direct vision. Throughout the                            procedure, the patient's blood pressure, pulse, and                            oxygen saturations were monitored continuously. The                            GIF [id number] was introduced through the                            mouth, and advanced to the second part of  duodenum.                            The upper GI endoscopy was accomplished without                            difficulty. The patient tolerated the procedure                            well. Scope In: Scope Out: Findings:                 The examined esophagus was normal.                           Scattered mild inflammation characterized by                            erosions and erythema was found in the gastric body                            and in the gastric antrum. Biopsies were taken with                            a cold forceps for histology and Helicobacter                            pylori testing.                           There was a medium-sized lipoma in the gastric                            body. This is a submucosal lesion with positive                            pillow sign when probed with closed forceps.                           The examined duodenum was normal. Complications:  No immediate complications. Estimated Blood Loss:     Estimated blood loss was minimal. Impression:               - Normal esophagus.                           - Gastritis. Biopsied.                           - Gastric lipoma.                           - Normal examined duodenum. Recommendation:           - Patient has a contact number available for                            emergencies. The signs and symptoms of potential                            delayed complications were discussed with the                            patient. Return to normal activities tomorrow.                            Written discharge instructions were provided to the                            patient.                           - Resume previous diet.                           - Continue present medications.                           - Await pathology results. Jerene Bears, MD 11/04/2020 11:29:53 AM This report has been signed electronically.

## 2020-11-04 NOTE — Patient Instructions (Signed)
Handouts on gastritis and diverticulosis given to you today  Await pathology results    YOU HAD AN ENDOSCOPIC PROCEDURE TODAY AT [facility]:   Refer to the procedure report that was given to you for any specific questions about what was found during the examination.  If the procedure report does not answer your questions, please call your gastroenterologist to clarify.  If you requested that your care partner not be given the details of your procedure findings, then the procedure report has been included in a sealed envelope for you to review at your convenience later.  YOU SHOULD EXPECT: Some feelings of bloating in the abdomen. Passage of more gas than usual.  Walking can help get rid of the air that was put into your GI tract during the procedure and reduce the bloating. If you had a lower endoscopy (such as a colonoscopy or flexible sigmoidoscopy) you may notice spotting of blood in your stool or on the toilet paper. If you underwent a bowel prep for your procedure, you may not have a normal bowel movement for a few days.  Please Note:  You might notice some irritation and congestion in your nose or some drainage.  This is from the oxygen used during your procedure.  There is no need for concern and it should clear up in a day or so.  SYMPTOMS TO REPORT IMMEDIATELY:  Following lower endoscopy (colonoscopy or flexible sigmoidoscopy):  Excessive amounts of blood in the stool  Significant tenderness or worsening of abdominal pains  Swelling of the abdomen that is new, acute  Fever of 100F or higher  Following upper endoscopy (EGD)  Vomiting of blood or coffee ground material  New chest pain or pain under the shoulder blades  Painful or persistently difficult swallowing  New shortness of breath  Fever of 100F or higher  Black, tarry-looking stools  For urgent or emergent issues, a gastroenterologist can be reached at any hour by calling [phone]. Do not use  MyChart messaging for urgent concerns.    DIET:  We do recommend a small meal at first, but then you may proceed to your regular diet.  Drink plenty of fluids but you should avoid alcoholic beverages for 24 hours.  ACTIVITY:  You should plan to take it easy for the rest of today and you should NOT DRIVE or use heavy machinery until tomorrow (because of the sedation medicines used during the test).    FOLLOW UP: Our staff will call the number listed on your records 48-72 hours following your procedure to check on you and address any questions or concerns that you may have regarding the information given to you following your procedure. If we do not reach you, we will leave a message.  We will attempt to reach you two times.  During this call, we will ask if you have developed any symptoms of COVID 19. If you develop any symptoms (ie: fever, flu-like symptoms, shortness of breath, cough etc.) before then, please call [phone].  If you test positive for Covid 19 in the 2 weeks post procedure, please call and report this information to [name].    If any biopsies were taken you will be contacted by phone or by letter within the next 1-3 weeks.  Please call us at [phone] if you have not heard about the biopsies in 3 weeks.    SIGNATURES/CONFIDENTIALITY: You and/or your care partner have signed paperwork which will be entered into your electronic medical  record.  These signatures attest to the fact that that the information above on your After Visit Summary has been reviewed and is understood.  Full responsibility of the confidentiality of this discharge information lies with you and/or your care-partner.

## 2020-11-04 NOTE — Progress Notes (Signed)
A and O x3. Report to RN. Tolerated MAC anesthesia well. 

## 2020-11-04 NOTE — Progress Notes (Signed)
GASTROENTEROLOGY PROCEDURE H&P NOTE   Primary Care Physician: [name], MD    Reason for Procedure:  GERD, nausea, abdominal bloating and history of colon polyps  Plan:    Upper endoscopy and colonoscopy  Patient is appropriate for endoscopic procedure(s) in the ambulatory ([facility] [place]) setting.  The nature of the procedure, as well as the risks, benefits, and alternatives were carefully and thoroughly reviewed with the patient. Ample time for discussion and questions allowed. The patient understood, was satisfied, and agreed to proceed.     HPI: [name] is a 70 y.o. female who presents for upper endoscopy and colonoscopy.  Seen in the office in May 2022.  See that note for details.  History as documented below.  No recent change in symptoms.  No complaint of dyspnea or chest pain today.  Past Medical History:  Diagnosis Date   Allergy    Anemia    Anxiety    takes Ativan daily   Arthritis    Assistance needed for mobility    Bipolar disorder ([place])    takes Risperdal nightly   Blood transfusion    Brain tumor ([place])    Cancer ([place])    In her gum   Carpal tunnel syndrome of right wrist 05/23/2011   Cervical disc disorder with radiculopathy of cervical region 10/31/2012   Chronic back pain    Chronic idiopathic constipation    Chronic neck and back pain    Colon polyps    COPD (chronic obstructive pulmonary disease) with chronic bronchitis ([place]) 09/16/2013   Office Spirometry 10/30/2013-submaximal effort based on appearance of loop and curve. Numbers would fit with severe restriction but her physiologic capability may be better than this. FVC 0.91/44%, and 10.74/45%, FEV1/FVC 0.81, FEF 25-75% 1.43/69%     Diabetes mellitus    Type II   Diverticulosis    TCS 9/08 by Dr. Delfin Edis for diarrhea . Bx for micro scopic colitis negative.    Fibromyalgia    Frequent falls    GERD (gastroesophageal reflux disease)    takes Aciphex daily   Glaucoma    eye drops daily    Gum symptoms    infection on antibiotic   Hemiplegia affecting non-dominant side, post-stroke 08/02/2011   Hiatal hernia    Hyperlipidemia    takes Crestor daily   Hypertension    takes Amlodipine,Metoprolol,and Clonidine daily   Hypothyroidism    takes Synthroid daily   IBS (irritable bowel syndrome)    Insomnia    takes Trazodone nightly   Major depression, recurrent ([place])    takes Zoloft daily   Malignant hyperpyrexia 5/00/3704   Metabolic encephalopathy 8/88/9169   Migraines    chronic headaches   Mononeuritis lower limb    Narcolepsy    Osteoporosis    Pancreatitis 2006   due to Depakote with normal EUS    Schatzki's ring    non critical / EGD with ED 8/2011with RMR   Seizures ([place])    takes Lamictal daily.Last seizure 3 yrs ago   Sleep apnea    on CPAP   Small bowel obstruction (HCC)    Stroke ([place])    left sided weakness, speech changes   Tubular adenoma of colon     Past Surgical History:  Procedure Laterality Date   ABDOMINAL HYSTERECTOMY  1978   BACK SURGERY  July 2012   BACTERIAL OVERGROWTH TEST N/A 05/05/2013   Procedure: BACTERIAL OVERGROWTH TEST;  Surgeon: Daneil Dolin, MD;  [facility]  [facility];  Service: Endoscopy;  Laterality: N/A;  7:30   BIOPSY THYROID  2009   BRAIN SURGERY  11/2011   resection of meningioma   BREAST REDUCTION SURGERY  1994   CARDIAC CATHETERIZATION  05/10/2005   normal coronaries, normal LV systolic function and EF (Dr. Jackie Plum)   [place] Left 07/22/04   Dr. Aline Brochure   CATARACT EXTRACTION Bilateral    CHOLECYSTECTOMY  1984   COLONOSCOPY N/A 09/25/2012   GHW:EXHBZJI diverticulosis.  colonic polyp-removed : tubular adenoma   CRANIOTOMY  11/23/2011   Procedure: CRANIOTOMY TUMOR EXCISION;  Surgeon: Hosie Spangle, MD;  Location: [facility];  Service: Neurosurgery;  Laterality: N/A;  Craniotomy for tumor resection   ESOPHAGOGASTRODUODENOSCOPY  12/29/2010   [place]-Retained food in the esophagus and stomach,  small hiatal hernia, status post Maloney dilation of the esophagus   ESOPHAGOGASTRODUODENOSCOPY N/A 09/25/2012   RCV:[id number] atonic baggy esophagus status post Maloney dilation 60 F. Hiatal hernia   GIVENS CAPSULE STUDY N/A 01/15/2013   NORMAL.    IR GENERIC HISTORICAL  03/17/2016   IR RADIOLOGIST EVAL & MGMT 03/17/2016 [facility]-INTER[facility] RAD   LESION REMOVAL N/A 05/31/2015   Procedure: REMOVAL RIGHT AND LEFT LESIONS OF MANDIBLE;  Surgeon: Diona Browner, DDS;  Location: [facility];  Service: Oral Surgery;  Laterality: N/A;   MALONEY DILATION  12/29/2010   RMR;   NM MYOCAR PERF WALL MOTION  2006   "relavtiely normal" persantine, mild anterior thinning (breast attenuation artifact), no region of scar/ischemia   OVARIAN CYST REMOVAL     RECTOCELE REPAIR N/A 06/29/2015   Procedure: POSTERIOR REPAIR (RECTOCELE);  Surgeon: Jonnie Kind, MD;  Location: [facility];  Service: Gynecology;  Laterality: N/A;   REDUCTION MAMMAPLASTY Bilateral    SPINE SURGERY  09/29/2010   Dr. Rolena Infante   surgical excision of 3 tumors from right thigh and right buttock  and left upper thigh  2010   TOOTH EXTRACTION Bilateral 12/14/2014   Procedure: REMOVAL OF BILATERAL MANDIBULAR EXOSTOSES;  Surgeon: Diona Browner, DDS;  Location: [facility];  Service: Oral Surgery;  Laterality: Bilateral;   TRANSTHORACIC ECHOCARDIOGRAM  2010   EF 60-65%, mild conc LVH, grade 1 diastolic dysfunction; mildly calcified MV annulus with mildly thickened leaflets, mildly calcified MR annulus    Prior to Admission medications   Medication Sig Start Date End Date Taking? Authorizing Provider  Accu-Chek Softclix Lancets lancets TEST BLOOD SUGAR THREE TIMES DAILY AS DIRECTED 04/20/20  Yes Philemon Kingdom, MD  albuterol (VENTOLIN HFA) 108 (90 Base) MCG/ACT inhaler INHALE 1 TO 2 PUFFS EVERY 6 HOURS AS NEEDED FOR WHEEZING, SHORTNESS OF BREATH 06/02/20  Yes Young, Clinton D, MD  Alcohol Swabs (B-D SINGLE USE SWABS REGULAR) PADS USE TO CLEAN FINGER PRIOR TO STICKING FOR BLOOD  SUGAR. 08/12/19  Yes Philemon Kingdom, MD  amLODipine (NORVASC) 10 MG tablet TAKE 1 TABLET EVERY DAY 01/20/20  Yes Imogene Burn, PA-C  ascorbic acid (VITAMIN C) 500 MG tablet Take 500 mg by mouth daily.   Yes [provider]  aspirin EC 81 MG tablet Take 1 tablet (81 mg total) by mouth daily with breakfast. 12/08/18  Yes Emokpae, Courage, MD  Blood Glucose Calibration (ACCU-CHEK GUIDE CONTROL) LIQD USE PRN TO CALIBRATE GLUCOMETER 08/12/19  Yes Philemon Kingdom, MD  blood glucose meter kit and supplies Dispense based on patient and insurance preference. Use up to four times daily as directed. (FOR ICD-10 E10.9, E11.9). 06/27/19  Yes Philemon Kingdom, MD  buPROPion [facility]  XL) 150 MG 24 hr tablet Take 1 tablet (150 mg total) by mouth every morning. 10/13/20 10/13/21 Yes Cloria Spring, MD  cetirizine (ZYRTEC) 10 MG tablet Take 1 tablet (10 mg total) by mouth daily. 07/30/17  Yes [name], MD  Continuous Blood Gluc Sensor (FREESTYLE LIBRE 14 DAY SENSOR) MISC 1 each by Does not apply route every 14 (fourteen) days. Change every 2 weeks 06/24/19  Yes Philemon Kingdom, MD  ezetimibe (ZETIA) 10 MG tablet Take 1 tablet (10 mg total) by mouth daily. 05/20/20  Yes [name], MD  glucose blood (ACCU-CHEK GUIDE) test strip USE TO CHECK BLOOD SUGAR FOUR TIMES A DAY AND PRN 08/12/19  Yes Philemon Kingdom, MD  insulin aspart (NOVOLOG FLEXPEN) 100 UNIT/ML FlexPen Inject 16 to 18 units under skin before meals up to 3 times a day 03/08/20  Yes Philemon Kingdom, MD  Insulin Pen Needle (SURE COMFORT PEN NEEDLES) 31G X 8 MM MISC USE TO for injecting insulin 4 TIMES DAILY. 03/08/20  Yes Philemon Kingdom, MD  lamoTRIgine (LAMICTAL) 100 MG tablet Take 1 tablet (100 mg total) by mouth 2 (two) times daily. 10/13/20  Yes Cloria Spring, MD  levothyroxine (SYNTHROID) 50 MCG tablet Take 1 tablet (50 mcg) 6 days 1/2 tablet (25 mcg) 1 day weekly. 08/09/20  Yes Philemon Kingdom, MD  LORazepam (ATIVAN)  0.5 MG tablet Take 1 tablet (0.5 mg total) by mouth 2 (two) times daily. 11/01/20  Yes Cloria Spring, MD  losartan (COZAAR) 50 MG tablet TAKE 1 TABLET EVERY DAY 10/20/20  Yes [name], MD  methocarbamol (ROBAXIN) 500 MG tablet Take 1 tablet (500 mg total) by mouth 2 (two) times daily as needed for muscle spasms. 10/16/20  Yes Noemi Chapel, MD  metoprolol tartrate (LOPRESSOR) 50 MG tablet TAKE 1 TABLET TWICE DAILY (NEED MD APPOINTMENT) 09/01/20  Yes Strader, [place] M, PA-C  montelukast (SINGULAIR) 10 MG tablet TAKE 1 TABLET EVERY DAY 10/20/20  Yes [name], MD  nystatin (MYCOSTATIN/NYSTOP) powder APPLY TO AFFECTED AREA 4 TIMES DAILY. 05/10/20  Yes [name], MD  Omega-3 Fatty Acids (FISH OIL PO) Take 1 capsule by mouth daily.   Yes [provider]  Plecanatide (TRULANCE) 3 MG TABS Take 3 mg by mouth daily as needed. 08/10/20  Yes Trenton Verne, Lajuan Lines, MD  pregabalin (LYRICA) 75 MG capsule Take 1 capsule (75 mg total) by mouth daily. 10/20/16  Yes Rexene Alberts, MD  RABEprazole (ACIPHEX) 20 MG tablet TAKE 1 TABLET TWICE DAILY (MUST KEEP 07/2020 APPT FOR FURTHER REFILLS) 09/08/20  Yes Omaira Mellen, Lajuan Lines, MD  RESTASIS 0.05 % ophthalmic emulsion  07/29/20  Yes [provider]  risperiDONE (RISPERDAL) 0.5 MG tablet Take 1 tablet (0.5 mg total) by mouth at bedtime. 10/13/20  Yes Cloria Spring, MD  rosuvastatin (CRESTOR) 5 MG tablet TAKE 1 TABLET AT BEDTIME 06/21/20  Yes [name], MD  sertraline (ZOLOFT) 100 MG tablet Take 1 tablet (100 mg total) by mouth daily. 10/13/20  Yes Cloria Spring, MD  STIOLTO RESPIMAT 2.5-2.5 MCG/ACT AERS INHALE 2 PUFFS INTO THE LUNGS DAILY. 05/31/20  Yes Parrett, Fonnie Mu, NP  TOUJEO MAX SOLOSTAR 300 UNIT/ML Solostar Pen INJECT 24 UNITS SUBCUTANEOUSLY INTO THE SKIN AT BEDTIME 03/01/20  Yes Philemon Kingdom, MD  UNABLE TO [facility] bed mattress x 1  DX: G47.33, J44.9 10/16/19  Yes [name], MD  UNABLE TO FIND Standard  wheelchair Dx [phone], [phone] 03/23/20  Yes Moshe Cipro,  Norwood Levo, MD  alendronate (FOSAMAX) 70 MG tablet TAKE 1 TABLET (70 MG TOTAL) BY MOUTH EVERY 7 (SEVEN) DAYS. TAKE WITH A FULL GLASS OF WATER ON AN EMPTY STOMACH. 02/09/20   [name], MD  betamethasone dipropionate 0.05 % cream Apply topically 2 (two) times daily as needed (Rash). 02/17/20   Lavonna Monarch, MD  clobetasol cream (TEMOVATE) 0.93 % Apply 1 application topically 2 (two) times daily.  07/30/18   [provider]  diclofenac Sodium (VOLTAREN) 1 % GEL APPLY 2 GRAMS  TOPICALLY 4 (FOUR) TIMES DAILY. 06/19/19   Esterwood, Amy S, PA-C  famotidine (PEPCID) 40 MG tablet Take 40 mg by mouth 2 (two) times daily. 08/30/20   [provider]  hydrOXYzine (ATARAX/VISTARIL) 10 MG tablet Take 1 tablet (10 mg total) by mouth 3 (three) times daily as needed. 08/03/20   [name], MD  meclizine (ANTIVERT) 25 MG tablet Take 1 tablet (25 mg total) by mouth 3 (three) times daily as needed for dizziness. 04/28/20   Noreene Larsson, NP  oxyCODONE (OXY IR/ROXICODONE) 5 MG immediate release tablet Take 5 mg by mouth every 6 (six) hours as needed. 03/02/20   [provider]  traZODone (DESYREL) 100 MG tablet Take 1 tablet (100 mg total) by mouth at bedtime. 10/13/20   Cloria Spring, MD  triamcinolone (KENALOG) 0.1 % Apply 1 application topically daily. 04/20/20   Lavonna Monarch, MD    Current Outpatient Medications  Medication Sig Dispense Refill   Accu-Chek Softclix Lancets lancets TEST BLOOD SUGAR THREE TIMES DAILY AS DIRECTED 300 each 2   albuterol (VENTOLIN HFA) 108 (90 Base) MCG/ACT inhaler INHALE 1 TO 2 PUFFS EVERY 6 HOURS AS NEEDED FOR WHEEZING, SHORTNESS OF BREATH 54 each 1   Alcohol Swabs (B-D SINGLE USE SWABS REGULAR) PADS USE TO CLEAN FINGER PRIOR TO STICKING FOR BLOOD SUGAR. 100 each 11   amLODipine (NORVASC) 10 MG tablet TAKE 1 TABLET EVERY DAY 90 tablet 3   ascorbic acid (VITAMIN C) 500 MG tablet Take 500 mg  by mouth daily.     aspirin EC 81 MG tablet Take 1 tablet (81 mg total) by mouth daily with breakfast. 120 tablet 2   Blood Glucose Calibration (ACCU-CHEK GUIDE CONTROL) LIQD USE PRN TO CALIBRATE GLUCOMETER 3 each 11   blood glucose meter kit and supplies Dispense based on patient and insurance preference. Use up to four times daily as directed. (FOR ICD-10 E10.9, E11.9). 1 each 0   buPROPion (WELLBUTRIN XL) 150 MG 24 hr tablet Take 1 tablet (150 mg total) by mouth every morning. 90 tablet 2   cetirizine (ZYRTEC) 10 MG tablet Take 1 tablet (10 mg total) by mouth daily. 7 tablet 0   Continuous Blood Gluc Sensor (FREESTYLE LIBRE 14 DAY SENSOR) MISC 1 each by Does not apply route every 14 (fourteen) days. Change every 2 weeks 2 each 11   ezetimibe (ZETIA) 10 MG tablet Take 1 tablet (10 mg total) by mouth daily. 90 tablet 1   glucose blood (ACCU-CHEK GUIDE) test strip USE TO CHECK BLOOD SUGAR FOUR TIMES A DAY AND PRN 400 each 11   insulin aspart (NOVOLOG FLEXPEN) 100 UNIT/ML FlexPen Inject 16 to 18 units under skin before meals up to 3 times a day 45 mL 3   Insulin Pen Needle (SURE COMFORT PEN NEEDLES) 31G X 8 MM MISC USE TO for injecting insulin 4 TIMES DAILY. 200 each 3   lamoTRIgine (LAMICTAL) 100 MG tablet Take 1  tablet (100 mg total) by mouth 2 (two) times daily. 180 tablet 2   levothyroxine (SYNTHROID) 50 MCG tablet Take 1 tablet (50 mcg) 6 days 1/2 tablet (25 mcg) 1 day weekly. 80 tablet 3   LORazepam (ATIVAN) 0.5 MG tablet Take 1 tablet (0.5 mg total) by mouth 2 (two) times daily. 60 tablet 2   losartan (COZAAR) 50 MG tablet TAKE 1 TABLET EVERY DAY 90 tablet 1   methocarbamol (ROBAXIN) 500 MG tablet Take 1 tablet (500 mg total) by mouth 2 (two) times daily as needed for muscle spasms. 20 tablet 0   metoprolol tartrate (LOPRESSOR) 50 MG tablet TAKE 1 TABLET TWICE DAILY (NEED MD APPOINTMENT) 180 tablet 3   montelukast (SINGULAIR) 10 MG tablet TAKE 1 TABLET EVERY DAY 90 tablet 1   nystatin  (MYCOSTATIN/NYSTOP) powder APPLY TO AFFECTED AREA 4 TIMES DAILY. 90 g 1   Omega-3 Fatty Acids (FISH OIL PO) Take 1 capsule by mouth daily.     Plecanatide (TRULANCE) 3 MG TABS Take 3 mg by mouth daily as needed. 30 tablet 0   pregabalin (LYRICA) 75 MG capsule Take 1 capsule (75 mg total) by mouth daily.     RABEprazole (ACIPHEX) 20 MG tablet TAKE 1 TABLET TWICE DAILY (MUST KEEP 07/2020 APPT FOR FURTHER REFILLS) 180 tablet 0   RESTASIS 0.05 % ophthalmic emulsion      risperiDONE (RISPERDAL) 0.5 MG tablet Take 1 tablet (0.5 mg total) by mouth at bedtime. 90 tablet 2   rosuvastatin (CRESTOR) 5 MG tablet TAKE 1 TABLET AT BEDTIME 90 tablet 2   sertraline (ZOLOFT) 100 MG tablet Take 1 tablet (100 mg total) by mouth daily. 90 tablet 2   STIOLTO RESPIMAT 2.5-2.5 MCG/ACT AERS INHALE 2 PUFFS INTO THE LUNGS DAILY. 12 g 1   TOUJEO MAX SOLOSTAR 300 UNIT/ML Solostar Pen INJECT 24 UNITS SUBCUTANEOUSLY INTO THE SKIN AT BEDTIME 12 mL 3   UNABLE TO [facility] bed mattress x 1  DX: G47.33, J44.9 1 each 0   UNABLE TO FIND Standard wheelchair Dx M47.16, R29.898 1 each 0   alendronate (FOSAMAX) 70 MG tablet TAKE 1 TABLET (70 MG TOTAL) BY MOUTH EVERY 7 (SEVEN) DAYS. TAKE WITH A FULL GLASS OF WATER ON AN EMPTY STOMACH. 12 tablet 3   betamethasone dipropionate 0.05 % cream Apply topically 2 (two) times daily as needed (Rash). 45 g 3   clobetasol cream (TEMOVATE) 0.51 % Apply 1 application topically 2 (two) times daily.      diclofenac Sodium (VOLTAREN) 1 % GEL APPLY 2 GRAMS  TOPICALLY 4 (FOUR) TIMES DAILY. 800 g 0   famotidine (PEPCID) 40 MG tablet Take 40 mg by mouth 2 (two) times daily.     hydrOXYzine (ATARAX/VISTARIL) 10 MG tablet Take 1 tablet (10 mg total) by mouth 3 (three) times daily as needed. 30 tablet 0   meclizine (ANTIVERT) 25 MG tablet Take 1 tablet (25 mg total) by mouth 3 (three) times daily as needed for dizziness. 30 tablet 0   oxyCODONE (OXY IR/ROXICODONE) 5 MG immediate release tablet Take 5 mg  by mouth every 6 (six) hours as needed.     traZODone (DESYREL) 100 MG tablet Take 1 tablet (100 mg total) by mouth at bedtime. 90 tablet 2   triamcinolone (KENALOG) 0.1 % Apply 1 application topically daily. 15 g 1   Current Facility-Administered Medications  Medication Dose Route Frequency Provider Last Rate Last Admin   0.9 %  sodium chloride infusion  500 mL Intravenous  Once Beverley Fiedler, MD        Allergies as of 11/04/2020 - Review Complete 11/04/2020  Allergen Reaction Noted   Lac bovis Rash 07/29/2008   Phenazopyridine hcl Hives 06/19/2007   Cephalexin Hives    Flonase [fluticasone]  04/27/2020   Iron Nausea And Vomiting 11/15/2012   Milk-related compounds Other (See Comments) 07/29/2008   Penicillins Hives    Phenazopyridine hcl Hives 06/19/2007    Family History  Problem Relation Age of Onset   Heart attack Mother        HTN   Pneumonia Father    Kidney failure Father    Diabetes Father    Pancreatic cancer Sister    Cancer Sister        breast    Cancer Sister        pancreatic   Diabetes Brother    Hypertension Brother    Diabetes Brother    Alcohol abuse Maternal Uncle    Stroke Maternal Grandmother    Heart attack Maternal Grandfather    Hypertension Son    Sleep apnea Son    Colon cancer Neg Hx    Anesthesia problems Neg Hx    Hypotension Neg Hx    Malignant hyperthermia Neg Hx    Pseudochol deficiency Neg Hx    Breast cancer Neg Hx    Stomach cancer Neg Hx     Social History   Socioeconomic History   Marital status: Divorced    Spouse name: Not on file   Number of children: 1   Years of education: 12   Highest education level: High school graduate  Occupational History   Occupation: Disabled  Tobacco Use   Smoking status: Former    Packs/day: 0.50    Years: 30.00    Pack years: 15.00    Types: Cigarettes    Quit date: 06/11/2020    Years since quitting: 0.4   Smokeless tobacco: Never   Tobacco comments:    quit 7 weeks  ago-07/14/2020-[facility]   Vaping Use   Vaping Use: Never used  Substance and Sexual Activity   Alcohol use: No    Alcohol/week: 0.0 standard drinks    Comment:     Drug use: No   Sexual activity: Not Currently  Other Topics Concern   Not on file  Social History Narrative   01/29/18 Lives alone, has 3 aides, Mon- Fri 8 hrs, 2 hrs on Sat-Sun, RN manages her meds   Caffeine use: Drink coffee sometimes    Right handed    Social Determinants of Corporate investment banker Strain: Low Risk    Difficulty of Paying Living Expenses: Not very hard  Food Insecurity: No Food Insecurity   Worried About Programme researcher, broadcasting/film/video in the Last Year: Never true   Ran Out of Food in the Last Year: Never true  Transportation Needs: No Transportation Needs   Lack of Transportation (Medical): No   Lack of Transportation (Non-Medical): No  Physical Activity: Inactive   Days of Exercise per Week: 0 days   Minutes of Exercise per Session: 0 min  Stress: Not on file  Social Connections: Moderately Isolated   Frequency of Communication with Friends and Family: Three times a week   Frequency of Social Gatherings with Friends and Family: Twice a week   Attends Religious Services: More than 4 times per year   Active Member of [employer] or Organizations: No   Attends Banker Meetings: Never  Marital Status: Divorced  Human resources officer Violence: Not on file    Physical Exam: Vital signs in last 24 hours: _0  (!) 200/73   Pulse 71   Temp (!) 96.8 F (36 C)   Resp (!) 23   Ht _1  (1.499 m)   Wt 169 lb (76.7 kg)   SpO2 100%   BMI 34.13 kg/m  GEN: NAD EYE: Sclerae anicteric ENT: MMM CV: Non-tachycardic Pulm: CTA b/l GI: Soft, NT/ND NEURO:  Alert & Oriented x 3   Zenovia Jarred, MD [facility]  11/04/2020 10:57 AM

## 2020-11-05 ENCOUNTER — Other Ambulatory Visit: Payer: Self-pay | Admitting: Internal Medicine

## 2020-11-06 ENCOUNTER — Other Ambulatory Visit: Payer: Self-pay | Admitting: Student

## 2020-11-08 ENCOUNTER — Telehealth: Payer: Self-pay | Admitting: *Deleted

## 2020-11-08 ENCOUNTER — Other Ambulatory Visit: Payer: Self-pay

## 2020-11-08 ENCOUNTER — Ambulatory Visit (INDEPENDENT_AMBULATORY_CARE_PROVIDER_SITE_OTHER): Payer: Medicare HMO | Admitting: Podiatry

## 2020-11-08 DIAGNOSIS — M79676 Pain in unspecified toe(s): Secondary | ICD-10-CM | POA: Diagnosis not present

## 2020-11-08 DIAGNOSIS — F250 Schizoaffective disorder, bipolar type: Secondary | ICD-10-CM | POA: Diagnosis not present

## 2020-11-08 DIAGNOSIS — J42 Unspecified chronic bronchitis: Secondary | ICD-10-CM | POA: Diagnosis not present

## 2020-11-08 DIAGNOSIS — F17210 Nicotine dependence, cigarettes, uncomplicated: Secondary | ICD-10-CM | POA: Diagnosis not present

## 2020-11-08 DIAGNOSIS — E039 Hypothyroidism, unspecified: Secondary | ICD-10-CM | POA: Diagnosis not present

## 2020-11-08 DIAGNOSIS — M160 Bilateral primary osteoarthritis of hip: Secondary | ICD-10-CM | POA: Diagnosis not present

## 2020-11-08 DIAGNOSIS — J449 Chronic obstructive pulmonary disease, unspecified: Secondary | ICD-10-CM | POA: Diagnosis not present

## 2020-11-08 DIAGNOSIS — B351 Tinea unguium: Secondary | ICD-10-CM

## 2020-11-08 DIAGNOSIS — E1142 Type 2 diabetes mellitus with diabetic polyneuropathy: Secondary | ICD-10-CM

## 2020-11-08 DIAGNOSIS — R2681 Unsteadiness on feet: Secondary | ICD-10-CM | POA: Diagnosis not present

## 2020-11-08 DIAGNOSIS — D649 Anemia, unspecified: Secondary | ICD-10-CM | POA: Diagnosis not present

## 2020-11-08 NOTE — Telephone Encounter (Signed)
  Follow up Call-  Call back number 11/04/2020  Post procedure Call Back phone  # [phone]  Permission to leave phone message Yes  Some recent data might be hidden     Patient questions:  Do you have a fever, pain , or abdominal swelling? No. Pain Score  0 *  Have you tolerated food without any problems? Yes.    Have you been able to return to your normal activities? No. No appetite  Do you have any questions about your discharge instructions: Diet   No. Medications  No. Follow up visit  Yes.    Do you have questions or concerns about your Care? Yes.    Actions: * If pain score is 4 or above: No action needed, pain <4.Have you developed a fever since your procedure? no  2.   Have you had an respiratory symptoms (SOB or cough) since your procedure? no  3.   Have you tested positive for COVID 19 since your procedure no  4.   Have you had any family members/close contacts diagnosed with the COVID 19 since your procedure?  no   If yes to any of these questions please route to Joylene John, RN and Joella Prince, RN

## 2020-11-08 NOTE — Telephone Encounter (Signed)
She is aware she needs to contact her pulmonary regarding the cpap machine

## 2020-11-08 NOTE — Telephone Encounter (Signed)
Patient scheduled for an appt to come in and discuss

## 2020-11-09 ENCOUNTER — Encounter: Payer: Self-pay | Admitting: Internal Medicine

## 2020-11-10 ENCOUNTER — Other Ambulatory Visit: Payer: Self-pay

## 2020-11-10 ENCOUNTER — Encounter (HOSPITAL_COMMUNITY): Payer: Self-pay | Admitting: Internal Medicine

## 2020-11-10 ENCOUNTER — Ambulatory Visit (INDEPENDENT_AMBULATORY_CARE_PROVIDER_SITE_OTHER): Payer: Medicare HMO | Admitting: Family Medicine

## 2020-11-10 ENCOUNTER — Ambulatory Visit (INDEPENDENT_AMBULATORY_CARE_PROVIDER_SITE_OTHER): Payer: Medicare HMO | Admitting: Psychiatry

## 2020-11-10 VITALS — BP 150/65 | HR 76 | Temp 99.6°F | Resp 21 | Ht 60.0 in | Wt 167.1 lb

## 2020-11-10 DIAGNOSIS — F3170 Bipolar disorder, currently in remission, most recent episode unspecified: Secondary | ICD-10-CM | POA: Diagnosis not present

## 2020-11-10 DIAGNOSIS — E785 Hyperlipidemia, unspecified: Secondary | ICD-10-CM | POA: Diagnosis not present

## 2020-11-10 DIAGNOSIS — E1142 Type 2 diabetes mellitus with diabetic polyneuropathy: Secondary | ICD-10-CM

## 2020-11-10 DIAGNOSIS — Z09 Encounter for follow-up examination after completed treatment for conditions other than malignant neoplasm: Secondary | ICD-10-CM

## 2020-11-10 DIAGNOSIS — I10 Essential (primary) hypertension: Secondary | ICD-10-CM

## 2020-11-10 DIAGNOSIS — H538 Other visual disturbances: Secondary | ICD-10-CM | POA: Diagnosis not present

## 2020-11-10 DIAGNOSIS — M5440 Lumbago with sciatica, unspecified side: Secondary | ICD-10-CM | POA: Diagnosis not present

## 2020-11-10 DIAGNOSIS — F331 Major depressive disorder, recurrent, moderate: Secondary | ICD-10-CM

## 2020-11-10 DIAGNOSIS — Z23 Encounter for immunization: Secondary | ICD-10-CM

## 2020-11-10 IMAGING — MR MR HEAD WO/W CM
13 series · 48 of 48 positions shown · IV contrast (multihance)
Comparison: MRI head 10/09/2018

CLINICAL DATA: Meningioma.  Post resection and SRS.

EXAM:
MRI HEAD WITHOUT AND WITH CONTRAST
TECHNIQUE: Multiplanar, multiecho pulse sequences of the brain and surrounding
structures were obtained without and with intravenous contrast.
CONTRAST:  15mL MULTIHANCE GADOBENATE DIMEGLUMINE 529 MG/ML IV SOLN

[Series 2: FLAIR · sagittal · 3.0mm · 0.75mm/px · 1 of 39 slices shown (1 of 2)]
[im 1/39]
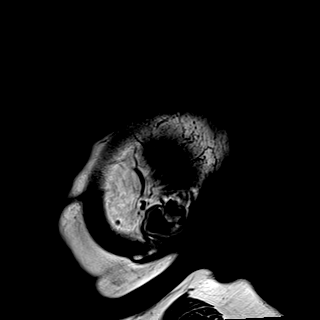

[Series 3: DWI · axial · 3.0mm · 1.50mm/px · z∈[-56,+88]mm · 4 of 78 slices shown (1 of 2)]
[im 1/78]
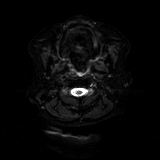
[im 26/78]
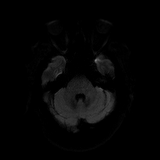
[im 52/78]
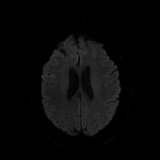
[im 78/78]
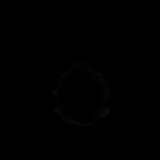

[Series 4: DWI · axial · 3.0mm · 1.50mm/px · z∈[-56,+88]mm · 2 of 39 slices shown (2 of 2)]
[im 1/39]
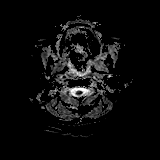
[im 39/39]
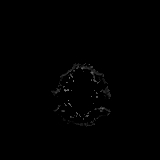

[Series 5: T2 · axial · 5.0mm · 0.57mm/px · 1 of 26 slices shown]
[im 1/26]
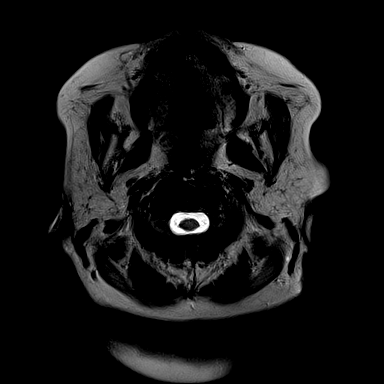

[Series 7: swi_images · axial · 1.5mm · 0.90mm/px · z∈[-55,+84]mm · 5 of 96 slices shown]
[im 1/96]
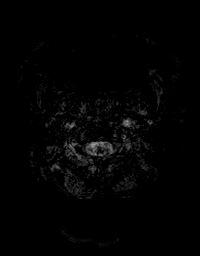
[im 24/96]
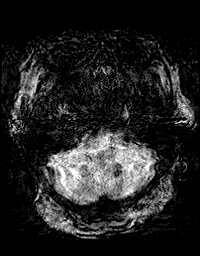
[im 48/96]
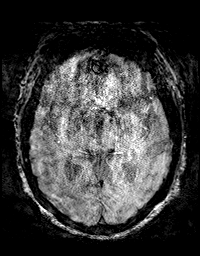
[im 72/96]
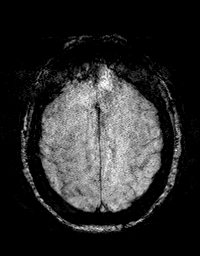
[im 96/96]
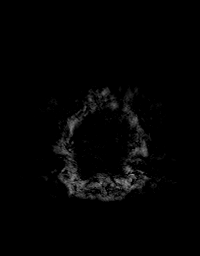

[Series 8: FLAIR · axial · 3.0mm · 0.57mm/px · z∈[-90,+63]mm · 3 of 52 slices shown (2 of 2)]
[im 1/52]
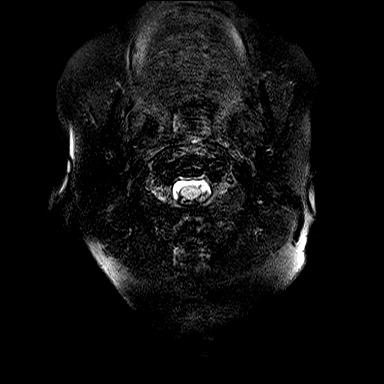
[im 26/52]
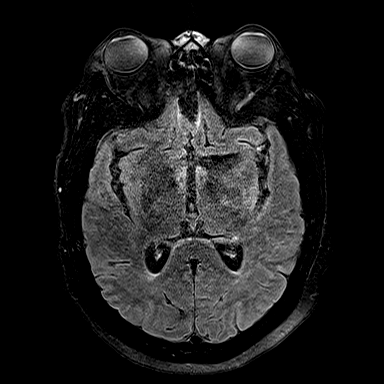
[im 52/52]
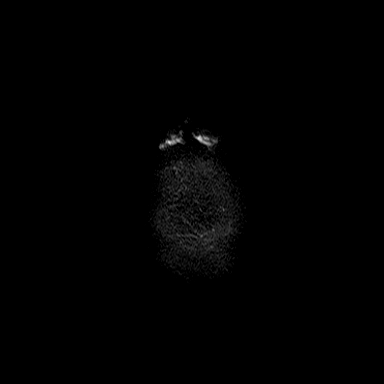

[Series 9: T1 · axial · 1.0mm · 0.75mm/px · z∈[-93,+66]mm · 9 of 160 slices shown]
[im 1/160]
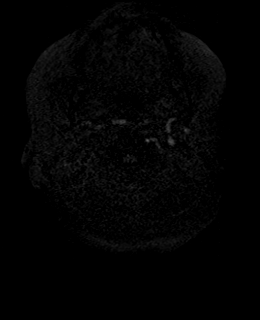
[im 20/160]
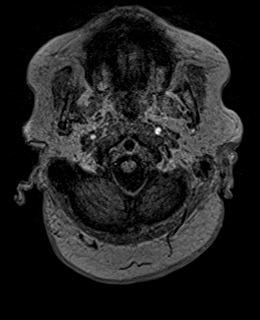
[im 40/160]
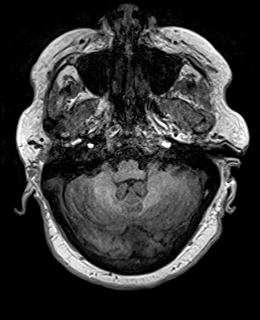
[im 60/160]
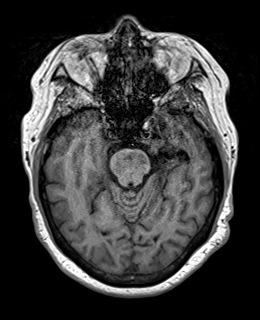
[im 80/160]
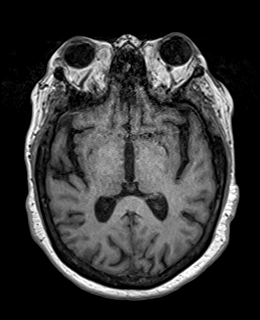
[im 100/160]
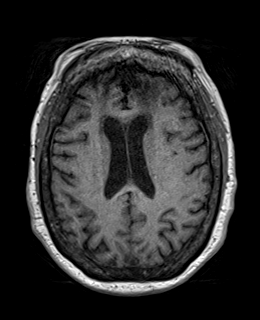
[im 120/160]
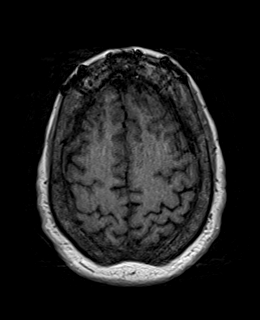
[im 140/160]
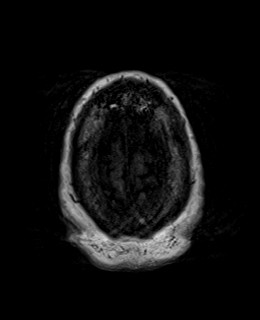
[im 160/160]
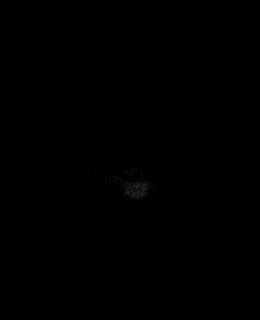

[Series 10: T2 post-contrast · coronal · 3.0mm · 0.57mm/px · 3 of 47 slices shown (1 of 2)]
[im 1/47]
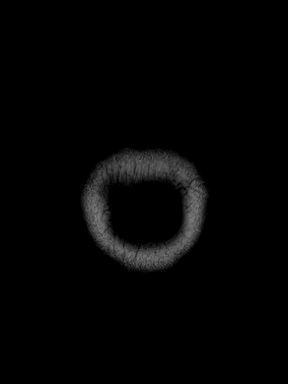
[im 24/47]
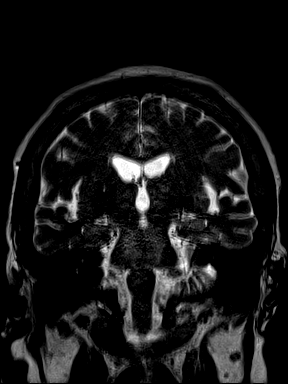
[im 47/47]
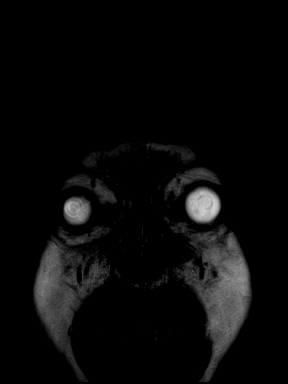

[Series 11: T1 post-contrast · axial · 1.0mm · 0.75mm/px · z∈[-93,+65]mm · 9 of 159 slices shown (1 of 3)]
[im 1/159]
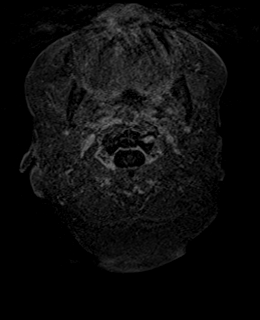
[im 20/159]
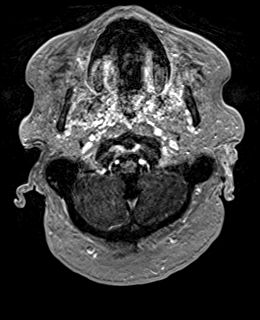
[im 40/159]
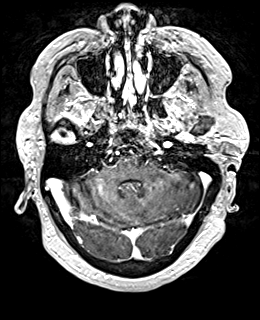
[im 60/159]
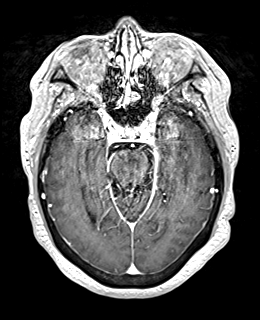
[im 80/159]
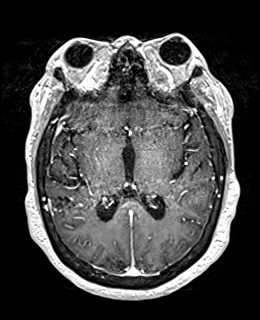
[im 99/159]
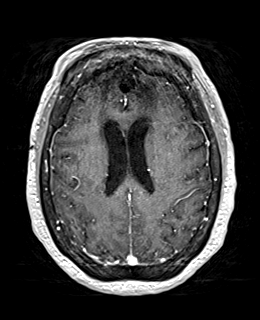
[im 119/159]
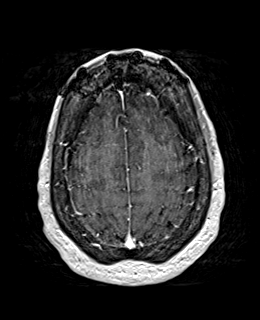
[im 139/159]
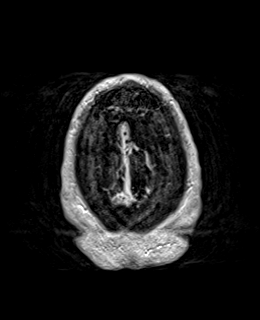
[im 159/159]
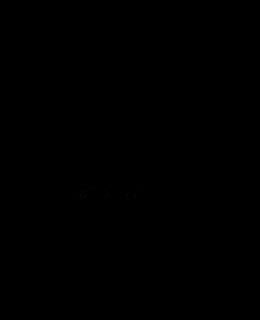

[Series 12: T2 post-contrast · coronal · 3.0mm · 0.57mm/px · 3 of 47 slices shown (2 of 2)]
[im 1/47]
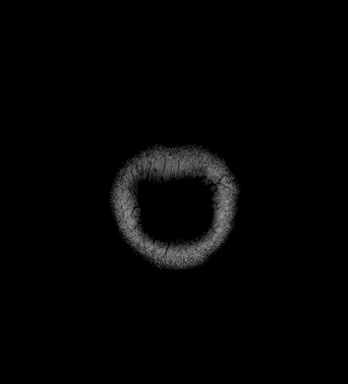
[im 24/47]
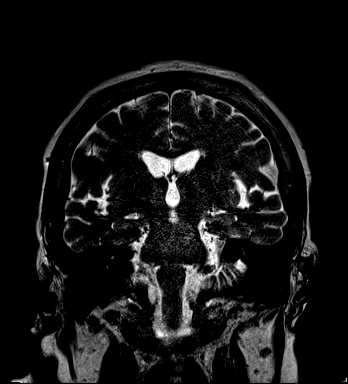
[im 47/47]
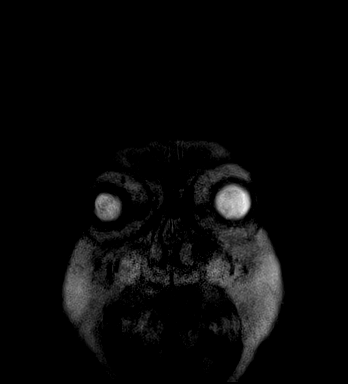

[Series 13: T1 post-contrast · coronal · 3.0mm · 0.57mm/px · 3 of 47 slices shown (2 of 3)]
[im 1/47]
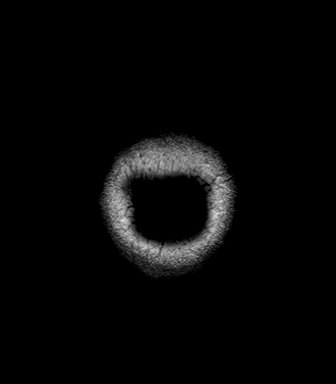
[im 24/47]
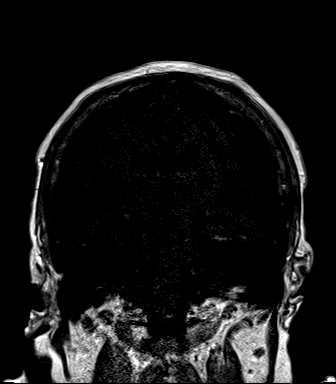
[im 47/47]
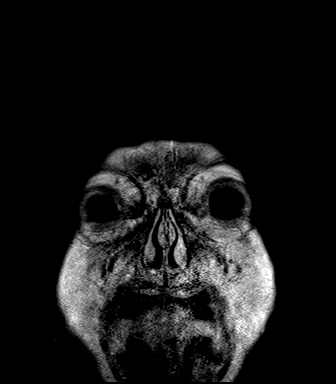

[Series 14: FLAIR post-contrast · sagittal · 3.0mm · 0.75mm/px · 2 of 39 slices shown]
[im 1/39]
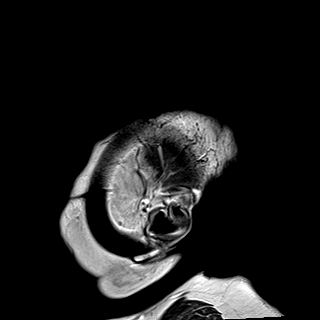
[im 39/39]
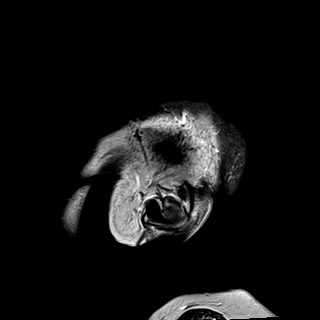

[Series 15: T1 post-contrast · coronal · 3.0mm · 0.57mm/px · 3 of 47 slices shown (3 of 3)]
[im 1/47]
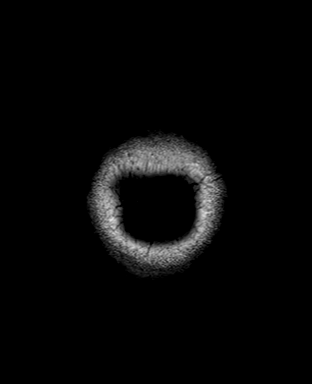
[im 24/47]
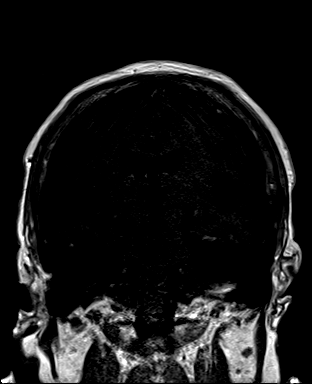
[im 47/47]
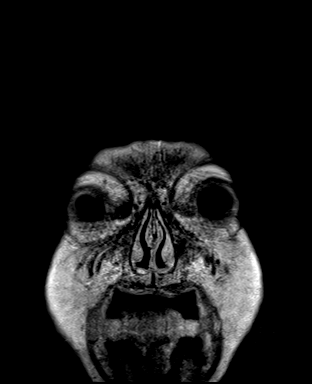

[48 of 48 positions shown; findings below may reference images not displayed]

FINDINGS: Brain: Image quality degraded by moderate motion as identified on
the prior study.

Frontal craniotomy in the midline for tumor resection. Postsurgical
encephalomalacia in the anterior frontal lobes bilaterally is
stable. Enhancing mass along the planum sphenoidale appears
unchanged. On axial image number 70 this measures 11 x 14 mm. No
other residual enhancing tumor.

Ventricle size normal. No acute infarct. Chronic microvascular
ischemic changes in the pons are stable.

Vascular: Normal arterial flow voids

Skull and upper cervical spine: Midline frontal craniotomy.

Sinuses/Orbits: Mild mucosal edema paranasal sinuses. Bilateral
cataract extraction.

Other: None
IMPRESSION: Planum sphenoidale meningioma measuring 11 x 14 mm, stable from the
prior study.

Postsurgical changes with bifrontal craniotomy and encephalomalacia
in the anterior frontal lobe stable.

Chronic ischemic change in the pons stable.

## 2020-11-10 MED ORDER — LOSARTAN POTASSIUM 100 MG PO TABS: 100.0000 mg | ORAL_TABLET | Freq: Every day | ORAL | 2 refills | Status: DC

## 2020-11-10 MED ORDER — METHOCARBAMOL 500 MG PO TABS: ORAL_TABLET | ORAL | 0 refills | Status: DC

## 2020-11-10 NOTE — Progress Notes (Signed)
Virtual Visit via Telephone Note  I connected with [name] on 11/10/20 at 4:17 PM EDT by telephone and verified that I am speaking with the correct person using two identifiers.  Location: Patient: Home Provider:  [place] office   I discussed the limitations, risks, security and privacy concerns of performing an evaluation and management service by telephone and the availability of in person appointments. I also discussed with the patient that there may be a patient responsible charge related to this service. The patient expressed understanding and agreed to proceed.   I provided 49 minutes of non-face-to-face time during this encounter.   [name], LCSW              THERAPIST PROGRESS NOTE    Session Time: Wednesday 11/10/2020 4:17 PM -  5:03 PM  Participation Level: Active  Behavioral Response:  Less depressed, talkative, alert   Type of Therapy: Individual Therapy  Treatment Goals addressed: Patient wants to learn how to improve coping skills to manage chronic pain and health issues/ improve mood    Interventions: CBT and Supportive             Summary: [name] is a 70 y.o. female who presents with  long standing history of recurrent periods  of depression beginning when she was [age] and her favorite uncle died. Patient reports multiple psychiatric hospitlaizations due to depression and suicidal ideations with the last one occuring in 1997. Patient has participated in outpatient psychotherapy and medication management intermittently since age 73.  She currently is seeing psychiatrist Dr. Harrington Challenger . Prior to this, she was seen at [facility]. Patient also has had ECT at [facility] Symptoms have worsened in recent months due to family stress and have  included depressed mood, anxiety, excessive worry, and tearfulness.         Patient's last contact was by virtual visit via telephone about 2-3  weeks ago.  She reports increased symptoms of depression  and states doing better.  She continues to experience tearfulness at times but continues to use distracting activities.  She reports decreased worry about her grandson as she initiated texting with him which resulted in grandson calling her more frequently.  Patient is very pleased about this.  She continues to attend church regularly.  She continues to express frustration about her multiple health issues and pain but expresses acceptance.  Suicidal/Homicidal: Nowithout intent/plan      Therapist Response: Reviewed symptoms, praised and reinforced patient's efforts to maintain involvement in activities, and initiating contact with grandson along with her use of assertive skills, discussed effects, discussed stressors, facilitated expression of thoughts and feelings validated feelings, began to introduce mindfulness and an understanding of the window of tolerance as a way to help regulate emotions, will send patient handout in preparation for next session   Diagnosis: Axis I: MDD, Recurrent, moderate    Axis II: Deferred    [name] Amon E Tavon Corriher, LCSW

## 2020-11-10 NOTE — Patient Instructions (Signed)
Keep appointment for annual exam as before, blood pressure re evaluation at visit   Flu vaccine today  Covid booster is past due please get this.  New higher dose of cozaar is 100 mg once daily, sent to [facility] prescribed for an additional 10 days because of pain  Fasting lipid, cmp and EGFR 3 to 5 days before November appointment  Be careful in your home  to avoid accidents, as we discussed  Thanks for choosing [facility], we consider it a privelige to serve you.

## 2020-11-11 ENCOUNTER — Encounter (HOSPITAL_COMMUNITY): Payer: Self-pay | Admitting: Internal Medicine

## 2020-11-11 ENCOUNTER — Encounter: Payer: Self-pay | Admitting: Podiatry

## 2020-11-11 DIAGNOSIS — F250 Schizoaffective disorder, bipolar type: Secondary | ICD-10-CM | POA: Diagnosis not present

## 2020-11-11 DIAGNOSIS — I10 Essential (primary) hypertension: Secondary | ICD-10-CM | POA: Diagnosis not present

## 2020-11-11 DIAGNOSIS — F17210 Nicotine dependence, cigarettes, uncomplicated: Secondary | ICD-10-CM | POA: Diagnosis not present

## 2020-11-11 DIAGNOSIS — R2681 Unsteadiness on feet: Secondary | ICD-10-CM | POA: Diagnosis not present

## 2020-11-11 DIAGNOSIS — E1142 Type 2 diabetes mellitus with diabetic polyneuropathy: Secondary | ICD-10-CM | POA: Diagnosis not present

## 2020-11-11 DIAGNOSIS — J449 Chronic obstructive pulmonary disease, unspecified: Secondary | ICD-10-CM | POA: Diagnosis not present

## 2020-11-11 DIAGNOSIS — M7989 Other specified soft tissue disorders: Secondary | ICD-10-CM | POA: Diagnosis not present

## 2020-11-11 DIAGNOSIS — I83893 Varicose veins of bilateral lower extremities with other complications: Secondary | ICD-10-CM | POA: Diagnosis not present

## 2020-11-11 DIAGNOSIS — E039 Hypothyroidism, unspecified: Secondary | ICD-10-CM | POA: Diagnosis not present

## 2020-11-11 DIAGNOSIS — D649 Anemia, unspecified: Secondary | ICD-10-CM | POA: Diagnosis not present

## 2020-11-11 DIAGNOSIS — J42 Unspecified chronic bronchitis: Secondary | ICD-10-CM | POA: Diagnosis not present

## 2020-11-11 DIAGNOSIS — M160 Bilateral primary osteoarthritis of hip: Secondary | ICD-10-CM | POA: Diagnosis not present

## 2020-11-11 NOTE — Progress Notes (Signed)
Subjective: [name] is a 70 y.o. female patient seen today fpr at risk foot care with h/o diabetic neuropathy. She is seen for follow up of  painful thick toenails that are difficult to trim. Pain interferes with ambulation. Aggravating factors include wearing enclosed shoe gear. Pain is relieved with periodic professional debridement.  She states she was diagnosed with a concussion due to fall earlier this month.  Patient states their blood glucose was 155 mg/dl today. Last A1c was 5.8%  PCP is [name], MD. Last visit was: 10/15/2020.  New problems reported today: None.  Allergies  Allergen Reactions   Lac Bovis Rash    Doesn't agree with stomach.    Phenazopyridine Hcl Hives   Cephalexin Hives   Flonase [Fluticasone]     "It gave me ulcers in my nose"   Iron Nausea And Vomiting   Milk-Related Compounds Other (See Comments)    Doesn't agree with stomach.    Penicillins Hives    Has patient had a PCN reaction causing immediate rash, facial/tongue/throat swelling, SOB or lightheadedness with hypotension: Yes Has patient had a PCN reaction causing severe rash involving mucus membranes or skin necrosis: No Has patient had a PCN reaction that required hospitalization No Has patient had a PCN reaction occurring within the last 10 years: No If all of the above answers are "NO", then may proceed with Cephalosporin use.    Phenazopyridine Hcl Hives          PCP is [name], MD .  Objective: Physical Exam  General: Patient is a pleasant 70 y.o. African American female in NAD. AAO x 3.   Neurovascular Examination: Capillary refill time to digits immediate b/l. Faintly palpable pedal pulses b/l LE. Pedal hair sparse b/l.  Lower extremity skin temperature gradient within normal limits. No edema noted b/l lower extremities.   Protective sensation intact diminished bilaterally with 10g monofilament b/l.  Dermatological:  Skin warm and supple b/l lower  extremities. No open wounds b/l lower extremities. No interdigital macerations b/l lower extremities. Toenails 1-5 b/l elongated, discolored, dystrophic, thickened, crumbly with subungual debris and tenderness to dorsal palpation.  Musculoskeletal:  Normal muscle strength 5/5 to all lower extremity muscle groups bilaterally. No pain crepitus or joint limitation noted with ROM b/l lower extremities. HAV with bunion deformity b/l.  Assessment: 1. Pain due to onychomycosis of toenail   2. Diabetic polyneuropathy associated with type 2 diabetes mellitus ([place])     Plan: -No new findings. No new orders. -Continue diabetic foot care principles: inspect feet daily, monitor glucose as recommended by PCP and/or Endocrinologist, and follow prescribed diet per PCP, Endocrinologist and/or dietician. -Patient to continue soft, supportive shoe gear daily. -Toenails 1-5 b/l were debrided in length and girth with sterile nail nippers and dremel without iatrogenic bleeding.  -Patient to report any pedal injuries to medical professional immediately. -Patient/POA to call should there be question/concern in the interim.  Return in about 3 months (around 02/08/2021).  Marzetta Board, DPM

## 2020-11-12 ENCOUNTER — Ambulatory Visit (INDEPENDENT_AMBULATORY_CARE_PROVIDER_SITE_OTHER): Payer: Medicare HMO | Admitting: Physician Assistant

## 2020-11-12 ENCOUNTER — Other Ambulatory Visit: Payer: Self-pay

## 2020-11-12 ENCOUNTER — Ambulatory Visit (HOSPITAL_COMMUNITY)
Admission: RE | Admit: 2020-11-12 | Discharge: 2020-11-12 | Disposition: A | Discharge status: Home / Self Care | Payer: Medicare HMO | Source: Ambulatory Visit | Attending: Vascular Surgery | Admitting: Vascular Surgery

## 2020-11-12 VITALS — BP 179/80 | HR 82 | Temp 98.1°F | Resp 20 | Ht 60.0 in | Wt 169.0 lb

## 2020-11-12 DIAGNOSIS — I83893 Varicose veins of bilateral lower extremities with other complications: Secondary | ICD-10-CM | POA: Diagnosis not present

## 2020-11-12 DIAGNOSIS — M7989 Other specified soft tissue disorders: Secondary | ICD-10-CM

## 2020-11-12 NOTE — Progress Notes (Signed)
Requested by:  [name], MD [address] [place],  [place] [address]  Reason for consultation: knots in veins    History of Present Illness   [name] is a 70 y.o. (08-23-1950) female who presents for evaluation of knots on her legs. She is uncertain how long the "knots" have been present. She says they come and go on both legs. She has larger one in the upper left medial thigh. They are not tender. No aggravating or alleviating factors. She does have mild swelling in her ankles. This usually resolves with elevation. She also says sometimes she feels sticking pains in her legs like she is getting stabbed with a needle. This comes and goes both at rest and on ambulation. Lasts only briefly. Says when she feels this she looks at her legs and feels like the veins are bulging out. She does not describe any rest pain or claudication symptoms. She does get weakness in her legs on ambulation and has to stop and rest. She says she has had multiple falls due to this. She walks with a rolling walker. She does endorse multiple history of multiple back surgeries as well as arthritis and fibromyalgia. She has no history of DVT. No family history of venous disease.   Venous symptoms include: swelling Onset/duration: uncertain Occupation:  retired Aggravating factors: none Alleviating factors: none Compression:  no Helps:  n/a Pain medications:  takes chronic pain medication, goes to pain clinic Previous vein procedures:  no History of DVT:  no  Past Medical History:  Diagnosis Date   Allergy    Anemia    Anxiety    takes Ativan daily   Arthritis    Assistance needed for mobility    Bipolar disorder ([place])    takes Risperdal nightly   Blood transfusion    Brain tumor ([place])    Cancer ([place])    In her gum   Carpal tunnel syndrome of right wrist 05/23/2011   Cervical disc disorder with radiculopathy of cervical region 10/31/2012   Chronic back pain    Chronic idiopathic  constipation    Chronic neck and back pain    Colon polyps    COPD (chronic obstructive pulmonary disease) with chronic bronchitis ([facility] [place]) 09/16/2013   Office Spirometry 10/30/2013-submaximal effort based on appearance of loop and curve. Numbers would fit with severe restriction but her physiologic capability may be better than this. FVC 0.91/44%, and 10.74/45%, FEV1/FVC 0.81, FEF 25-75% 1.43/69%     Diabetes mellitus    Type II   Diverticulosis    TCS 9/08 by Dr. Delfin Edis for diarrhea . Bx for micro scopic colitis negative.    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   Metabolic encephalopathy 4/40/1027   Migraines    chronic headaches   Mononeuritis lower limb    Narcolepsy  Osteoporosis    Pancreatitis 2006   due to Depakote with normal EUS    Schatzki's ring    non critical / EGD with ED 8/2011with RMR   Seizures ([facility])    takes Lamictal daily.Last seizure 3 yrs ago   Sleep apnea    on CPAP   Small bowel obstruction ([facility])    Stroke ([place])    left sided weakness, speech changes   Tubular adenoma of colon     Past Surgical History:  Procedure Laterality Date   ABDOMINAL HYSTERECTOMY  1978   BACK SURGERY  July 2012   BACTERIAL OVERGROWTH TEST N/A 05/05/2013   Procedure: BACTERIAL OVERGROWTH TEST;  Surgeon: Daneil Dolin, MD;  [facility];  Service: Endoscopy;  Laterality: N/A;  7:30   BIOPSY THYROID  2009   BRAIN SURGERY  11/2011    resection of meningioma   BREAST REDUCTION SURGERY  1994   CARDIAC CATHETERIZATION  05/10/2005   normal coronaries, normal LV systolic function and EF (Dr. Jackie Plum)   [facility] Left 07/22/04   Dr. Aline Brochure   CATARACT EXTRACTION Bilateral    CHOLECYSTECTOMY  1984   COLONOSCOPY N/A 09/25/2012   EXN:TZGYFVC diverticulosis.  colonic polyp-removed : tubular adenoma   CRANIOTOMY  11/23/2011   Procedure: CRANIOTOMY TUMOR EXCISION;  Surgeon: Hosie Spangle, MD;  Location: [facility];  Service: Neurosurgery;  Laterality: N/A;  Craniotomy for tumor resection   ESOPHAGOGASTRODUODENOSCOPY  12/29/2010   [place]-Retained food in the esophagus and stomach, small hiatal hernia, status post Maloney dilation of the esophagus   ESOPHAGOGASTRODUODENOSCOPY N/A 09/25/2012   BSW:HQPRFFMB atonic baggy esophagus status post Maloney dilation 54 F. Hiatal hernia   GIVENS CAPSULE STUDY N/A 01/15/2013   NORMAL.    IR GENERIC HISTORICAL  03/17/2016   IR RADIOLOGIST EVAL & MGMT 03/17/2016 [facility]   LESION REMOVAL N/A 05/31/2015   Procedure: REMOVAL RIGHT AND LEFT LESIONS OF MANDIBLE;  Surgeon: Diona Browner, DDS;  Location: [facility];  Service: Oral Surgery;  Laterality: N/A;   MALONEY DILATION  12/29/2010   RMR;   NM MYOCAR PERF WALL MOTION  2006   "relavtiely normal" persantine, mild anterior thinning (breast attenuation artifact), no region of scar/ischemia   OVARIAN CYST REMOVAL     RECTOCELE REPAIR N/A 06/29/2015   Procedure: POSTERIOR REPAIR (RECTOCELE);  Surgeon: Jonnie Kind, MD;  Location: [facility];  Service: Gynecology;  Laterality: N/A;   REDUCTION MAMMAPLASTY Bilateral    SPINE SURGERY  09/29/2010   Dr. Rolena Infante   surgical excision of 3 tumors from right thigh and right buttock  and left upper thigh  2010   TOOTH EXTRACTION Bilateral 12/14/2014   Procedure: REMOVAL OF BILATERAL MANDIBULAR EXOSTOSES;  Surgeon: Diona Browner, DDS;  Location: [facility];  Service: Oral Surgery;  Laterality: Bilateral;    TRANSTHORACIC ECHOCARDIOGRAM  2010   EF 60-65%, mild conc LVH, grade 1 diastolic dysfunction; mildly calcified MV annulus with mildly thickened leaflets, mildly calcified MR annulus    Social History   Socioeconomic History   Marital status: Divorced    Spouse name: Not on file   Number of children: 1   Years of education: 12   Highest education level: High school graduate  Occupational History   Occupation: Disabled  Tobacco Use   Smoking status: Former    Packs/day: 0.50    Years: 30.00    Pack years: 15.00    Types: Cigarettes    Quit date: 06/11/2020  Years since quitting: 0.4   Smokeless tobacco: Never   Tobacco comments:    quit 7 weeks ago-07/14/2020-AH   Vaping Use   Vaping Use: Never used  Substance and Sexual Activity   Alcohol use: No    Alcohol/week: 0.0 standard drinks    Comment:     Drug use: No   Sexual activity: Not Currently  Other Topics Concern   Not on file  Social History Narrative   01/29/18 Lives alone, has 3 aides, Mon- Fri 8 hrs, 2 hrs on Sat-Sun, RN manages her meds   Caffeine use: Drink coffee sometimes    Right handed    Social Determinants of Radio broadcast assistant Strain: Low Risk    Difficulty of Paying Living Expenses: Not very hard  Food Insecurity: No Food Insecurity   Worried About Charity fundraiser in the Last Year: Never true   Arboriculturist in the Last Year: Never true  Transportation Needs: No Transportation Needs   Lack of Transportation (Medical): No   Lack of Transportation (Non-Medical): No  Physical Activity: Inactive   Days of Exercise per Week: 0 days   Minutes of Exercise per Session: 0 min  Stress: Not on file  Social Connections: Moderately Isolated   Frequency of Communication with Friends and Family: Three times a week   Frequency of Social Gatherings with Friends and Family: Twice a week   Attends Religious Services: More than 4 times per year   Active Member of [employer] or Organizations: No    Attends Music therapist: Never   Marital Status: Divorced  Human resources officer Violence: Not on file    Family History  Problem Relation Age of Onset   Heart attack Mother        HTN   Pneumonia Father    Kidney failure Father    Diabetes Father    Pancreatic cancer Sister    Cancer Sister        breast    Cancer Sister        pancreatic   Diabetes Brother    Hypertension Brother    Diabetes Brother    Alcohol abuse Maternal Uncle    Stroke Maternal Grandmother    Heart attack Maternal Grandfather    Hypertension Son    Sleep apnea Son    Colon cancer Neg Hx    Anesthesia problems Neg Hx    Hypotension Neg Hx    Malignant hyperthermia Neg Hx    Pseudochol deficiency Neg Hx    Breast cancer Neg Hx    Stomach cancer Neg Hx     Current Outpatient Medications  Medication Sig Dispense Refill   Accu-Chek Softclix Lancets lancets TEST BLOOD SUGAR THREE TIMES DAILY AS DIRECTED 300 each 2   albuterol (VENTOLIN HFA) 108 (90 Base) MCG/ACT inhaler INHALE 1 TO 2 PUFFS EVERY 6 HOURS AS NEEDED FOR WHEEZING, SHORTNESS OF BREATH 54 each 1   Alcohol Swabs (DROPSAFE ALCOHOL PREP) 70 % PADS USE TO CLEAN FINGER PRIOR TO TESTING FOR BLOOD SUGAR  AS DIRECTED 300 each 0   alendronate (FOSAMAX) 70 MG tablet TAKE 1 TABLET (70 MG TOTAL) BY MOUTH EVERY 7 (SEVEN) DAYS. TAKE WITH A FULL GLASS OF WATER ON AN EMPTY STOMACH. 12 tablet 3   amLODipine (NORVASC) 10 MG tablet TAKE 1 TABLET EVERY DAY 90 tablet 1   ascorbic acid (VITAMIN C) 500 MG tablet Take 500 mg by mouth daily.     aspirin  EC 81 MG tablet Take 1 tablet (81 mg total) by mouth daily with breakfast. 120 tablet 2   betamethasone dipropionate 0.05 % cream Apply topically 2 (two) times daily as needed (Rash). 45 g 3   Blood Glucose Calibration (ACCU-CHEK GUIDE CONTROL) LIQD USE PRN TO CALIBRATE GLUCOMETER 3 each 11   blood glucose meter kit and supplies Dispense based on patient and insurance preference. Use up to four times daily as  directed. (FOR ICD-10 E10.9, E11.9). 1 each 0   buPROPion (WELLBUTRIN XL) 150 MG 24 hr tablet Take 1 tablet (150 mg total) by mouth every morning. 90 tablet 2   cetirizine (ZYRTEC) 10 MG tablet Take 1 tablet (10 mg total) by mouth daily. 7 tablet 0   clobetasol cream (TEMOVATE) 6.80 % Apply 1 application topically 2 (two) times daily.      Continuous Blood Gluc Sensor (FREESTYLE LIBRE 14 DAY SENSOR) MISC 1 each by Does not apply route every 14 (fourteen) days. Change every 2 weeks 2 each 11   diclofenac Sodium (VOLTAREN) 1 % GEL APPLY 2 GRAMS  TOPICALLY 4 (FOUR) TIMES DAILY. 800 g 0   ezetimibe (ZETIA) 10 MG tablet Take 1 tablet (10 mg total) by mouth daily. 90 tablet 1   famotidine (PEPCID) 40 MG tablet Take 40 mg by mouth 2 (two) times daily.     glucose blood (ACCU-CHEK GUIDE) test strip TEST BLOOD SUGAR FOUR TIMES DAILY AND AS NEEDED 450 strip 0   hydrOXYzine (ATARAX/VISTARIL) 10 MG tablet Take 1 tablet (10 mg total) by mouth 3 (three) times daily as needed. 30 tablet 0   insulin aspart (NOVOLOG FLEXPEN) 100 UNIT/ML FlexPen Inject 16 to 18 units under skin before meals up to 3 times a day 45 mL 3   Insulin Pen Needle (SURE COMFORT PEN NEEDLES) 31G X 8 MM MISC USE TO for injecting insulin 4 TIMES DAILY. 200 each 3   lamoTRIgine (LAMICTAL) 100 MG tablet Take 1 tablet (100 mg total) by mouth 2 (two) times daily. 180 tablet 2   levothyroxine (SYNTHROID) 50 MCG tablet Take 1 tablet (50 mcg) 6 days 1/2 tablet (25 mcg) 1 day weekly. 80 tablet 3   LORazepam (ATIVAN) 0.5 MG tablet Take 1 tablet (0.5 mg total) by mouth 2 (two) times daily. 60 tablet 2   losartan (COZAAR) 100 MG tablet Take 1 tablet (100 mg total) by mouth daily. 30 tablet 2   meclizine (ANTIVERT) 25 MG tablet Take 1 tablet (25 mg total) by mouth 3 (three) times daily as needed for dizziness. 30 tablet 0   methocarbamol (ROBAXIN) 500 MG tablet Take one tablet by mouth two times daily , as needed, for neck and back pain and spasm 20 tablet  0   metoprolol tartrate (LOPRESSOR) 50 MG tablet TAKE 1 TABLET TWICE DAILY (NEED MD APPOINTMENT) 180 tablet 3   montelukast (SINGULAIR) 10 MG tablet TAKE 1 TABLET EVERY DAY 90 tablet 1   nystatin (MYCOSTATIN/NYSTOP) powder APPLY TO AFFECTED AREA 4 TIMES DAILY. 90 g 1   Omega-3 Fatty Acids (FISH OIL PO) Take 1 capsule by mouth daily.     oxyCODONE (OXY IR/ROXICODONE) 5 MG immediate release tablet Take 5 mg by mouth every 6 (six) hours as needed.     Plecanatide (TRULANCE) 3 MG TABS Take 3 mg by mouth daily as needed. 30 tablet 0   pregabalin (LYRICA) 75 MG capsule Take 1 capsule (75 mg total) by mouth daily.     RABEprazole (ACIPHEX) 20 MG  tablet TAKE 1 TABLET TWICE DAILY (MUST KEEP 07/2020 APPT FOR FURTHER REFILLS) 180 tablet 0   RESTASIS 0.05 % ophthalmic emulsion      risperiDONE (RISPERDAL) 0.5 MG tablet Take 1 tablet (0.5 mg total) by mouth at bedtime. 90 tablet 2   rosuvastatin (CRESTOR) 5 MG tablet TAKE 1 TABLET AT BEDTIME 90 tablet 2   sertraline (ZOLOFT) 100 MG tablet Take 1 tablet (100 mg total) by mouth daily. 90 tablet 2   STIOLTO RESPIMAT 2.5-2.5 MCG/ACT AERS INHALE 2 PUFFS INTO THE LUNGS DAILY. 12 g 1   TOUJEO MAX SOLOSTAR 300 UNIT/ML Solostar Pen INJECT 24 UNITS SUBCUTANEOUSLY INTO THE SKIN AT BEDTIME 12 mL 3   traZODone (DESYREL) 100 MG tablet Take 1 tablet (100 mg total) by mouth at bedtime. 90 tablet 2   triamcinolone (KENALOG) 0.1 % Apply 1 application topically daily. 15 g 1   UNABLE TO [facility] bed mattress x 1  DX: G47.33, J44.9 1 each 0   UNABLE TO FIND Standard wheelchair Dx M47.16, R29.898 1 each 0   No current facility-administered medications for this visit.    Allergies  Allergen Reactions   Lac Bovis Rash    Doesn't agree with stomach.    Phenazopyridine Hcl Hives   Cephalexin Hives   Flonase [Fluticasone]     "It gave me ulcers in my nose"   Iron Nausea And Vomiting   Milk-Related Compounds Other (See Comments)    Doesn't agree with stomach.     Penicillins Hives    Has patient had a PCN reaction causing immediate rash, facial/tongue/throat swelling, SOB or lightheadedness with hypotension: Yes Has patient had a PCN reaction causing severe rash involving mucus membranes or skin necrosis: No Has patient had a PCN reaction that required hospitalization No Has patient had a PCN reaction occurring within the last 10 years: No If all of the above answers are "NO", then may proceed with Cephalosporin use.    Phenazopyridine Hcl Hives          REVIEW OF SYSTEMS (negative unless checked):   Cardiac:  []  Chest pain or chest pressure? []  Shortness of breath upon activity? []  Shortness of breath when lying flat? []  Irregular heart rhythm?  Vascular:  []  Pain in calf, thigh, or hip brought on by walking? []  Pain in feet at night that wakes you up from your sleep? []  Blood clot in your veins? []  Leg swelling?  Pulmonary:  []  Oxygen at home? []  Productive cough? []  Wheezing?  Neurologic:  []  Sudden weakness in arms or legs? []  Sudden numbness in arms or legs? []  Sudden onset of difficult speaking or slurred speech? []  Temporary loss of vision in one eye? []  Problems with dizziness?  Gastrointestinal:  []  Blood in stool? []  Vomited blood?  Genitourinary:  []  Burning when urinating? []  Blood in urine?  Psychiatric:  []  Major depression  Hematologic:  []  Bleeding problems? []  Problems with blood clotting?  Dermatologic:  []  Rashes or ulcers?  Constitutional:  []  Fever or chills?  Ear/Nose/Throat:  []  Change in hearing? []  Nose bleeds? []  Sore throat?  Musculoskeletal:  []  Back pain? []  Joint pain? []  Muscle pain?   Physical Examination     Vitals:   11/12/20 1059  BP: (!) 179/80  Pulse: 82  Resp: 20  Temp: 98.1 F (36.7 C)  SpO2: 98%  Weight: 169 lb (76.7 kg)  Height: 5' (1.524 m)   Body mass index is 33.01 kg/m.  General:  WDWN  in NAD; vital signs documented above Gait: Normal, ambulates  with rolling walker HENT: WNL, normocephalic Pulmonary: normal non-labored breathing , without wheezing Cardiac: regular HR, without  Murmurs without carotid bruit Abdomen: obese, soft, NT, no masses Vascular Exam/Pulses:  Right Left  Radial 2+ (normal) 2+ (normal)  Femoral 2+ (normal) 2+ (normal)  Popliteal 2+ (normal) 2+ (normal)  DP 2+ (normal) 2+ (normal)  PT Not palpable  Not palpable   Extremities: without varicose veins, with reticular veins, without edema, without stasis pigmentation, without lipodermatosclerosis, without ulcers. She has soft, 1 cm mass on distal lateral right leg and second on proximal posterior right leg, non tender, no overlying skin changes, also has one on proximal medial left leg. Has large soft lobulated mass on medial upper left thigh Musculoskeletal: no muscle wasting or atrophy  Neurologic: A&O X 3;  No focal weakness or paresthesias are detected Psychiatric:  The pt has Normal affect.  Non-invasive Vascular Imaging   BLE Venous Insufficiency Duplex (11/12/20):   LLE: No DVT and SVT No GSV reflux  GSV diameter 0.15-0.26 No SSV reflux  No deep venous reflux   Medical Decision Making   Kanyah Matsushima Muise is a 70 y.o. female who presents with intermittent knots in her left leg. Her duplex today shows competent veins in entirety of LLE. She has no DVT or SVT. Her "knots" are likely soft tissue masses/ cysts. Her left upper thigh mass clinically is likely a lipoma. I have discussed with her that these are not dangerous and would not need to be surgically removed unless they become very large or painful. I do not feel the " knots" are arterial or venous related. Reassurance provided to patient that these are not blood clots. I have encouraged her to elevate her legs for any swelling. Without any venous disease on duplex she does not necessarily warrant knee high compression but told patient that she could wear them as needed. She will follow up as needed or if  she has new or worsening symptoms   Karoline Caldwell, PA-C [facility]: [phone]  11/12/2020, 12:21 PM  On call MD: Scot Dock

## 2020-11-16 DIAGNOSIS — F17210 Nicotine dependence, cigarettes, uncomplicated: Secondary | ICD-10-CM | POA: Diagnosis not present

## 2020-11-16 DIAGNOSIS — F250 Schizoaffective disorder, bipolar type: Secondary | ICD-10-CM | POA: Diagnosis not present

## 2020-11-16 DIAGNOSIS — J42 Unspecified chronic bronchitis: Secondary | ICD-10-CM | POA: Diagnosis not present

## 2020-11-16 DIAGNOSIS — E1142 Type 2 diabetes mellitus with diabetic polyneuropathy: Secondary | ICD-10-CM | POA: Diagnosis not present

## 2020-11-16 DIAGNOSIS — J449 Chronic obstructive pulmonary disease, unspecified: Secondary | ICD-10-CM | POA: Diagnosis not present

## 2020-11-16 DIAGNOSIS — R2681 Unsteadiness on feet: Secondary | ICD-10-CM | POA: Diagnosis not present

## 2020-11-16 DIAGNOSIS — E039 Hypothyroidism, unspecified: Secondary | ICD-10-CM | POA: Diagnosis not present

## 2020-11-16 DIAGNOSIS — D649 Anemia, unspecified: Secondary | ICD-10-CM | POA: Diagnosis not present

## 2020-11-16 DIAGNOSIS — M160 Bilateral primary osteoarthritis of hip: Secondary | ICD-10-CM | POA: Diagnosis not present

## 2020-11-16 NOTE — Progress Notes (Signed)
HPI F Former smoker followed for obstructive sleep apnea with hypersomnia, , R/O'd Lung nodule,Wheezing dyspnea, complicated by hx resection of meningioma. Bipolar, GERD, DM, chronic epistaxis NPSG 3/8/13[name] [place]- mild obstructive sleep apnea, AHI 14 per hour, mostly in REM. Epworth sleepiness score 14/24. Weight 164 pounds. She is living alone, divorced and disabled. Son has OSA. History of meningioma 11/23/2011. No seizure in over 3 years. Does not drive Office Spirometry 10/30/2013-submaximal effort based on appearance of loop and curve. Numbers would fit with severe restriction but her physiologic capability may be better than this. FVC 0.91/44%, FEV1 0.74/45%, FEV1/FVC 0.81, FEF 25-75% 1.43/69%. Office Spirometry 07/27/2015-weak effort but still better than her last previous trial effort. Mild restriction of exhaled volume. FVC 1.35/67%, FEV1 1.27/79%, FEV1/FVC 0.94, FEF 25-75 percent 2.14/107% --------------------------------------------------------------------------------------------------------   09/18/19- 69 yoF smoker followed for obstructive sleep apnea with hypersomnia, , R/O'd Lung nodule,Wheezing dyspnea/ tobacco, complicated by hx resection of meningioma. Bipolar/ Depression, GERD, DM, chronic epistaxis  CPAP 5-15 auto/ [facility] ([employer]) Still smoking 5-10 cigs/ day Download compliance 30%, AHI 11.3/ hr  Used 16 days, not used 14 days Pressure ranges 6.3-9.8 High leak Patient arrived diaphoretic and weak- had not had lunch- Fingerstick CBG 52. Patient was given a cookie and sweet tea. Repeated CBG in 15 minutes: 123. Stiolto, singulair, Ventolin hfa,  Body weight today 166 lbs Orthopedist told her too many medical problems for rotator cuff repair- pain tough to bear. She blames poor mask fit for poor CPAP compliance. Difficult coping. She says DME told her to go online for mask help- that's a big ask for her. Says breathing ok and meds ok. No recent cough or discolored sputum. CT  from Nov reviewed. She would like to quit smoking but doesn't feel she can cope. Agrees to work with Pharmacy team. Hx XRT for meningioma- says now she has another one, being followed.  CT chest 01/16/2019- IMPRESSION: 1. No suspicious pulmonary nodule or mass on today's study. There is some focal chronic atelectasis or scarring in the lingula, not substantially changed since July of 2018. This likely accounts for the nodular opacity seen on the recent chest x-ray. 2.  Aortic Atherosclerois (ICD10-170.0)  11/17/20- 77 yoF Smoker followed for Obstructive Sleep Apnea with hypersomnia, , R/O'd Lung nodule,Wheezing dyspnea/ tobacco, complicated by hx resection of meningioma. Bipolar/ Depression, GERD, DM,HTN,  chronic epistaxis  CPAP 5-12 auto/ [facility] [employer])      Dream [place] 2 Auto Still smoking 5-10 cigs/ day Download- Compliance 0    used 26.7% of days, all < 4 hours, AHI 15.9/ hr Body weight today- Covid vax- She finally got her recalled Dreamstation CPAP replacement and has an appointment at end of week at [place] to learn how to use it. No breathing exacerbation. Finds Stiolto hard to twist to operate  We are going to try samples of Trelegy.  CT low dose screening 07/14/20- IMPRESSION: 1. Lung-RADS 1, negative. Continue annual screening with low-dose chest CT without contrast in 12 months. 2. Coronary artery calcifications. Aortic Atherosclerosis (ICD10-I70.0) and Emphysema (ICD10-J43.9).   ROS-see HPI + = positive Constitutional:   No-   weight loss, night sweats, fevers, chills, +fatigue, lassitude. HEENT:  + headaches, +difficulty swallowing, tooth/dental problems, sore throat,       No-sneezing, itching, ear ache, nasal congestion, post nasal drip,  CV:  chest pain, orthopnea, PND, swelling in lower extremities, anasarca, dizziness, +palpitations Resp: +  shortness of breath with exertion or at rest.  productive cough,   non-productive cough,  No- coughing up of blood.               No-   change in color of mucus.   wheezing.   Skin: No-   rash or lesions. GI:  +heartburn, +indigestion, no-abdominal pain, nausea, vomiting,  GU:  MS:  + joint pain or swelling.   Neuro-     nothing unusual Psych:  No- change in mood or affect. +depression or +anxiety.  No memory loss.  OBJ- Physical Exam - + she speaks slowly but seems alert and appropriate,  General-  Oriented, Affect-flat, Distress- none acute Skin- rash-none, lesions- none, excoriation- none Lymphadenopathy- none Head- Eyes- Gross vision intact, PERRLA, conjunctivae and secretions clear            Ears- Hearing, canals-normal + = positive            Nose- no-Septal dev, polyps, erosion, perforation             Throat- Mallampati III , mucosa clear , drainage- none, tonsils- atrophic,  + dentures Neck- flexible , trachea midline, no stridor , thyroid nl, carotid no bruit Chest - symmetrical excursion , unlabored           Heart/CV- RRR , no murmur , no gallop  , no rub, nl s1 s2                           - JVD- none , edema- none, stasis changes- none, varices- none           Lung- clear to P&A, wheeze- none, cough- mild , dullness-none, rub- none           Chest wall-  Abd-  Br/ Gen/ Rectal- Not done, not indicated Extrem- cyanosis- none, clubbing, none, atrophy- none, strength- nl. +crolling walker Neuro- + halting speech

## 2020-11-17 ENCOUNTER — Other Ambulatory Visit: Payer: Self-pay | Admitting: Internal Medicine

## 2020-11-17 ENCOUNTER — Other Ambulatory Visit: Payer: Self-pay | Admitting: Nurse Practitioner

## 2020-11-17 ENCOUNTER — Ambulatory Visit (INDEPENDENT_AMBULATORY_CARE_PROVIDER_SITE_OTHER): Payer: Medicare HMO | Admitting: Internal Medicine

## 2020-11-17 ENCOUNTER — Other Ambulatory Visit: Payer: Self-pay | Admitting: Family Medicine

## 2020-11-17 ENCOUNTER — Other Ambulatory Visit: Payer: Self-pay | Admitting: Physician Assistant

## 2020-11-17 ENCOUNTER — Encounter: Payer: Self-pay | Admitting: Internal Medicine

## 2020-11-17 ENCOUNTER — Other Ambulatory Visit: Payer: Self-pay

## 2020-11-17 DIAGNOSIS — J449 Chronic obstructive pulmonary disease, unspecified: Secondary | ICD-10-CM

## 2020-11-17 DIAGNOSIS — G4733 Obstructive sleep apnea (adult) (pediatric): Secondary | ICD-10-CM | POA: Diagnosis not present

## 2020-11-17 MED ORDER — TRELEGY ELLIPTA 100-62.5-25 MCG/INH IN AEPB
1.0000 | INHALATION_SPRAY | Freq: Every day | RESPIRATORY_TRACT | 0 refills | Status: DC
Start: 2020-11-17 — End: 2021-03-24

## 2020-11-17 MED ORDER — TRELEGY ELLIPTA 100-62.5-25 MCG/INH IN AEPB: 1.0000 | INHALATION_SPRAY | Freq: Every day | RESPIRATORY_TRACT | 0 refills | Status: DC

## 2020-11-17 NOTE — Telephone Encounter (Signed)
She is one of yours. Continue pepcid?

## 2020-11-17 NOTE — Assessment & Plan Note (Signed)
Overlap between COPED, cardiac and weakness issues. Plan- try samples of Trelegy 100 instead of Stiolto.

## 2020-11-17 NOTE — Patient Instructions (Signed)
Keep your appointment on Friday with [facility] to get you comfortable with your replacement CPAP machine. You can ask them to try a different mask. Our goal is to have you using your CPAP whenever you sleep- at the very least try for 4 hours/ night.  Order- sample x 2 Trelegy 100 inhaler    inhale 1 puff, then rinse mouth, once daily.  Try this instead of the Stiolto twist inhaler. If you like the Trelegy better, let us know and we can send a prescription.   Please call if we can help

## 2020-11-17 NOTE — Assessment & Plan Note (Signed)
She feels she needs training in use of new CPAP. We will reassess after she is settled with it, to ee if settings should be adjusted. Emphasis on compliance. Plan- keep appointment with [name]

## 2020-11-18 ENCOUNTER — Encounter: Payer: Self-pay | Admitting: Family Medicine

## 2020-11-18 ENCOUNTER — Ambulatory Visit (INDEPENDENT_AMBULATORY_CARE_PROVIDER_SITE_OTHER): Payer: Medicare HMO | Admitting: Adult Health

## 2020-11-18 ENCOUNTER — Encounter: Payer: Self-pay | Admitting: Adult Health

## 2020-11-18 ENCOUNTER — Telehealth: Payer: Self-pay | Admitting: Family Medicine

## 2020-11-18 VITALS — BP 135/71 | HR 61 | Ht 59.0 in | Wt 169.4 lb

## 2020-11-18 DIAGNOSIS — G40909 Epilepsy, unspecified, not intractable, without status epilepticus: Secondary | ICD-10-CM | POA: Diagnosis not present

## 2020-11-18 DIAGNOSIS — H538 Other visual disturbances: Secondary | ICD-10-CM | POA: Insufficient documentation

## 2020-11-18 NOTE — Assessment & Plan Note (Signed)
Stable , managed by Psych

## 2020-11-18 NOTE — Progress Notes (Signed)
   [name]     MRN: [id number]      DOB: 07-12-1950   HPI [name] is here c/o headache and blurry vision over the past several weeks after direct head trauma while bending over , a dressing table drawer hit her on 10/14/2020, was evaluated in the ED on 10/16/2020, dx being hematoma of occiput and concussion without LOC. Had complained and still c/o vision change and was referred to ophthalmology from the ED Also c/o right arm pain with the injury and increased lower back pain and requests additional medication for this, has had some benefit with robaxin initially prescribed in the ED ROS Denies recent fever or chills. Denies sinus pressure, nasal congestion, ear pain or sore throat. Denies chest congestion, productive cough or wheezing. Denies chest pains, palpitations and leg swelling Denies abdominal pain, nausea, vomiting,diarrhea or constipation.   Denies dysuria, frequency, hesitancy or incontinence. Chronic  depression and  anxiety or insomnia. Denies skin break down or rash.   PE  BP (!) 150/65 (BP Location: Right Arm, Patient Position: Sitting) Comment: manual BP  Pulse 76   Temp 99.6 F (37.6 C)   Resp (!) 21   Ht 5' (1.524 m)   Wt 167 lb 1.9 oz (75.8 kg)   SpO2 93%   BMI 32.64 kg/m   Patient alert and oriented  HEENT: No facial asymmetry, EOMI,     Neck decreased ROM Hematoma on posterior scalp , right.  Chest: Clear to auscultation bilaterally.Decreased air entry  CVS: S1, S2 no murmurs, no S3.Regular rate.  ABD: Soft non tender.   Ext: No edema  [id number]  ROM spine, shoulders, hips and knees.  Skin: Intact, no ulcerations or rash noted.No visible skin breakdown  Psych: Good eye contact, normal affect. Memory intact not anxious or depressed appearing.  CNS: CN 2-12 intact, power,  normal throughout.no focal deficits noted.   Assessment & Plan  Encounter for examination following treatment at hospital Patient in for follow up of recent ED  visit. Discharge summary, and laboratory and radiology data are reviewed, and any questions or concerns  are discussed. Specific issues requiring follow up are specifically addressed.Will verify that appt for vision is made Home safety to review reduction in accidents discussed   Essential hypertension Uncontrolled, not at [place] and review in next 2 month DASH diet and commitment to daily physical activity for a minimum of 30 minutes discussed and encouraged, as a part of hypertension management. The importance of attaining a healthy weight is also discussed.  BP/Weight 11/18/2020 11/17/2020 11/12/2020 11/10/2020 11/04/2020 [id number]  Systolic BP [id number]  Diastolic BP 71 60 80 65 77 62 59  Wt. (Lbs) 169.4 168.2 169 167.12 169 170.8 169  BMI 34.21 33.97 33.01 32.64 34.13 33.36 33.01  Some encounter information is confidential and restricted. Go to Review Flowsheets activity to see all data.       Bipolar disorder ([place]) Stable , managed by Psych  Backache Increase following recent fall. Short course of robaxin is prescribed  Blurry vision Reports blurry vision following blow to head by desser drawer on 08/04, will establish if persisting still and refer for opthal eval

## 2020-11-18 NOTE — Progress Notes (Signed)
PATIENT: [name] DOB: [birth year]  REASON FOR VISIT: follow up HISTORY FROM: patient  HISTORY OF PRESENT ILLNESS: Today 11/18/20: [name] is a 70 year old female with a history of seizures.  She returns today for follow-up.  She continues on Lamictal 100 mg twice a day.  She denies any seizure events.  She lives at home alone.  She does have an aide that comes in to assist her with ADLs.  She reports that she needs help dressing and cleaning the house.  She uses a Rollator when ambulating.  She reports in August she suffered a fall with a hematoma in the left occipital region.  She did go to the emergency room and have imaging.  Patient states that since then she has had some headaches and dizziness.  She returns today for an evaluation.  02/04/19: [name] is a 70 year old female with a history of seizures.  She returns today for follow-up.  She denies any seizure events.  She remains on Lamictal 100 mg twice a day.  Denies any changes with her gait or balance.  She requires help with her ADLs.  She has an aide that comes in and assist her.  She no longer operates a motor vehicle.  She reports that she has been receiving iron infusions.  She states that her hemoglobin, hematocrit and RBCs are low.  She returns today for an evaluation.  HISTORY 01/29/18:   [name] is a 70 year old female with a history of seizures.  She returns today for follow-up.  She is currently on Lamictal.  She is tolerating this medication well.  She denies any seizure events.  She continues to live at home alone.  She does have an aide that comes in and helps with ADLs.  She does not operate a motor vehicle.  She returns today for evaluation.  REVIEW OF SYSTEMS: Out of a complete 14 system review of symptoms, the patient complains only of the following symptoms, and all other reviewed systems are negative.  See HPI  ALLERGIES: Allergies  Allergen Reactions   Lac Bovis Rash    Doesn't agree with stomach.     Phenazopyridine Hcl Hives   Cephalexin Hives   Flonase [Fluticasone]     "It gave me ulcers in my nose"   Iron Nausea And Vomiting   Milk-Related Compounds Other (See Comments)    Doesn't agree with stomach.    Penicillins Hives    Has patient had a PCN reaction causing immediate rash, facial/tongue/throat swelling, SOB or lightheadedness with hypotension: Yes Has patient had a PCN reaction causing severe rash involving mucus membranes or skin necrosis: No Has patient had a PCN reaction that required hospitalization No Has patient had a PCN reaction occurring within the last 10 years: No If all of the above answers are "NO", then may proceed with Cephalosporin use.    Phenazopyridine Hcl Hives          HOME MEDICATIONS: Outpatient Medications Prior to Visit  Medication Sig Dispense Refill   Accu-Chek Softclix Lancets lancets TEST BLOOD SUGAR THREE TIMES DAILY AS DIRECTED 300 each 2   albuterol (VENTOLIN HFA) 108 (90 Base) MCG/ACT inhaler INHALE 1 TO 2 PUFFS EVERY 6 HOURS AS NEEDED FOR WHEEZING, SHORTNESS OF BREATH 54 each 1   Alcohol Swabs (DROPSAFE ALCOHOL PREP) 70 % PADS USE TO CLEAN FINGER PRIOR TO TESTING FOR BLOOD SUGAR  AS DIRECTED 300 each 0   alendronate (FOSAMAX) 70 MG tablet TAKE 1 TABLET (70  MG TOTAL) BY MOUTH EVERY 7 (SEVEN) DAYS. TAKE WITH A FULL GLASS OF WATER ON AN EMPTY STOMACH. 12 tablet 3   amLODipine (NORVASC) 10 MG tablet TAKE 1 TABLET EVERY DAY 90 tablet 1   ascorbic acid (VITAMIN C) 500 MG tablet Take 500 mg by mouth daily.     aspirin EC 81 MG tablet Take 1 tablet (81 mg total) by mouth daily with breakfast. 120 tablet 2   betamethasone dipropionate 0.05 % cream Apply topically 2 (two) times daily as needed (Rash). 45 g 3   Blood Glucose Calibration (ACCU-CHEK GUIDE CONTROL) LIQD USE PRN TO CALIBRATE GLUCOMETER 3 each 11   blood glucose meter kit and supplies Dispense based on patient and insurance preference. Use up to four times daily as directed. (FOR ICD-10  E10.9, E11.9). 1 each 0   buPROPion (WELLBUTRIN XL) 150 MG 24 hr tablet Take 1 tablet (150 mg total) by mouth every morning. 90 tablet 2   cetirizine (ZYRTEC) 10 MG tablet Take 1 tablet (10 mg total) by mouth daily. 7 tablet 0   clobetasol cream (TEMOVATE) 1.61 % Apply 1 application topically 2 (two) times daily.      Continuous Blood Gluc Sensor (FREESTYLE LIBRE 14 DAY SENSOR) MISC 1 each by Does not apply route every 14 (fourteen) days. Change every 2 weeks 2 each 11   diclofenac Sodium (VOLTAREN) 1 % GEL APPLY 2 GRAMS  TOPICALLY FOUR TIMES DAILY. 700 g 1   DROPLET PEN NEEDLES 31G X 8 MM MISC USE FOR INJECTING INSULIN 4 TIMES DAILY. 400 each 0   ezetimibe (ZETIA) 10 MG tablet TAKE 1 TABLET EVERY DAY 90 tablet 1   famotidine (PEPCID) 40 MG tablet Take 40 mg by mouth 2 (two) times daily.     Fluticasone-Umeclidin-Vilant (TRELEGY ELLIPTA) 100-62.5-25 MCG/INH AEPB Inhale 1 puff into the lungs daily. 120 each 0   glucose blood (ACCU-CHEK GUIDE) test strip TEST BLOOD SUGAR FOUR TIMES DAILY AND AS NEEDED 450 strip 0   hydrOXYzine (ATARAX/VISTARIL) 10 MG tablet Take 1 tablet (10 mg total) by mouth 3 (three) times daily as needed. 30 tablet 0   insulin aspart (NOVOLOG FLEXPEN) 100 UNIT/ML FlexPen INJECT 16 TO 18 UNITS UNDER SKIN UP TO 3 TIMES A DAY BEFORE MEALS  (DISCARD PEN 28 DAYS AFTER OPENING) 60 mL 0   lamoTRIgine (LAMICTAL) 100 MG tablet Take 1 tablet (100 mg total) by mouth 2 (two) times daily. 180 tablet 2   levothyroxine (SYNTHROID) 50 MCG tablet Take 1 tablet (50 mcg) 6 days 1/2 tablet (25 mcg) 1 day weekly. 80 tablet 3   LORazepam (ATIVAN) 0.5 MG tablet Take 1 tablet (0.5 mg total) by mouth 2 (two) times daily. 60 tablet 2   losartan (COZAAR) 100 MG tablet Take 1 tablet (100 mg total) by mouth daily. 30 tablet 2   meclizine (ANTIVERT) 25 MG tablet Take 1 tablet (25 mg total) by mouth 3 (three) times daily as needed for dizziness. 30 tablet 0   methocarbamol (ROBAXIN) 500 MG tablet Take one  tablet by mouth two times daily , as needed, for neck and back pain and spasm 20 tablet 0   metoprolol tartrate (LOPRESSOR) 50 MG tablet TAKE 1 TABLET TWICE DAILY (NEED MD APPOINTMENT) 180 tablet 3   montelukast (SINGULAIR) 10 MG tablet TAKE 1 TABLET EVERY DAY 90 tablet 1   nystatin (MYCOSTATIN/NYSTOP) powder APPLY TO AFFECTED AREA 4 TIMES DAILY. 90 g 1   Omega-3 Fatty Acids (FISH OIL PO) Take  1 capsule by mouth daily.     oxyCODONE (OXY IR/ROXICODONE) 5 MG immediate release tablet Take 5 mg by mouth every 6 (six) hours as needed.     Plecanatide (TRULANCE) 3 MG TABS Take 3 mg by mouth daily as needed. 30 tablet 0   pregabalin (LYRICA) 75 MG capsule Take 1 capsule (75 mg total) by mouth daily.     RABEprazole (ACIPHEX) 20 MG tablet TAKE 1 TABLET TWICE DAILY (MUST KEEP 07/2020 APPT FOR FURTHER REFILLS) 180 tablet 0   RESTASIS 0.05 % ophthalmic emulsion      risperiDONE (RISPERDAL) 0.5 MG tablet Take 1 tablet (0.5 mg total) by mouth at bedtime. 90 tablet 2   rosuvastatin (CRESTOR) 5 MG tablet TAKE 1 TABLET AT BEDTIME 90 tablet 2   sertraline (ZOLOFT) 100 MG tablet Take 1 tablet (100 mg total) by mouth daily. 90 tablet 2   STIOLTO RESPIMAT 2.5-2.5 MCG/ACT AERS INHALE 2 PUFFS INTO THE LUNGS DAILY. 12 g 1   TOUJEO MAX SOLOSTAR 300 UNIT/ML Solostar Pen INJECT 24 UNITS SUBCUTANEOUSLY INTO THE SKIN AT BEDTIME 12 mL 3   traZODone (DESYREL) 100 MG tablet Take 1 tablet (100 mg total) by mouth at bedtime. 90 tablet 2   triamcinolone (KENALOG) 0.1 % Apply 1 application topically daily. 15 g 1   UNABLE TO [facility] bed mattress x 1  DX: G47.33, J44.9 1 each 0   UNABLE TO FIND Standard wheelchair Dx M47.16, R29.898 1 each 0   No facility-administered medications prior to visit.    PAST MEDICAL HISTORY: Past Medical History:  Diagnosis Date   Allergy    Anemia    Anxiety    takes Ativan daily   Arthritis    Assistance needed for mobility    Bipolar disorder ([place])    takes Risperdal nightly    Blood transfusion    Brain tumor ([place])    Cancer ([place])    In her gum   Carpal tunnel syndrome of right wrist 05/23/2011   Cervical disc disorder with radiculopathy of cervical region 10/31/2012   Chronic back pain    Chronic idiopathic constipation    Chronic neck and back pain    Colon polyps    COPD (chronic obstructive pulmonary disease) with chronic bronchitis ([place]) 09/16/2013   Office Spirometry 10/30/2013-submaximal effort based on appearance of loop and curve. Numbers would fit with severe restriction but her physiologic capability may be better than this. FVC 0.91/44%, and 10.74/45%, FEV1/FVC 0.81, FEF 25-75% 1.43/69%     Diabetes mellitus    Type II   Diverticulosis    TCS 9/08 by Dr. Delfin Edis for diarrhea . Bx for micro scopic colitis negative.    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   Metabolic encephalopathy 06/01/252   Migraines    chronic headaches   Mononeuritis lower limb    Narcolepsy  Osteoporosis    Pancreatitis 2006   due to Depakote with normal EUS    Schatzki's ring    non critical / EGD with ED 8/2011with RMR   Seizures ([facility])    takes Lamictal daily.Last seizure 3 yrs ago   Sleep apnea    on CPAP   Small bowel obstruction ([facility])    Stroke ([place])    left sided weakness, speech changes   Tubular adenoma of colon     PAST SURGICAL HISTORY: Past Surgical History:  Procedure Laterality Date   ABDOMINAL HYSTERECTOMY  1978    BACK SURGERY  July 2012   BACTERIAL OVERGROWTH TEST N/A 05/05/2013   Procedure: BACTERIAL OVERGROWTH TEST;  Surgeon: Daneil Dolin, MD;  [facility];  Service: Endoscopy;  Laterality: N/A;  7:30   BIOPSY THYROID  2009   BRAIN SURGERY  11/2011   resection of meningioma   BREAST REDUCTION SURGERY  1994   CARDIAC CATHETERIZATION  05/10/2005   normal coronaries, normal LV systolic function and EF (Dr. Jackie Plum)   [place] Left 07/22/04   Dr. Aline Brochure   CATARACT EXTRACTION Bilateral    CHOLECYSTECTOMY  1984   COLONOSCOPY N/A 09/25/2012   HWE:XHBZJIR diverticulosis.  colonic polyp-removed : tubular adenoma   CRANIOTOMY  11/23/2011   Procedure: CRANIOTOMY TUMOR EXCISION;  Surgeon: Hosie Spangle, MD;  Location: [facility];  Service: Neurosurgery;  Laterality: N/A;  Craniotomy for tumor resection   ESOPHAGOGASTRODUODENOSCOPY  12/29/2010   [place]-Retained food in the esophagus and stomach, small hiatal hernia, status post Maloney dilation of the esophagus   ESOPHAGOGASTRODUODENOSCOPY N/A 09/25/2012   CVE:LFYBOFBP atonic baggy esophagus status post Maloney dilation 77 F. Hiatal hernia   GIVENS CAPSULE STUDY N/A 01/15/2013   NORMAL.    IR GENERIC HISTORICAL  03/17/2016   IR RADIOLOGIST EVAL & MGMT 03/17/2016 [facility]   LESION REMOVAL N/A 05/31/2015   Procedure: REMOVAL RIGHT AND LEFT LESIONS OF MANDIBLE;  Surgeon: Diona Browner, DDS;  Location: [facility];  Service: Oral Surgery;  Laterality: N/A;   MALONEY DILATION  12/29/2010   RMR;   NM MYOCAR PERF WALL MOTION  2006   "relavtiely normal" persantine, mild anterior thinning (breast attenuation artifact), no region of scar/ischemia   OVARIAN CYST REMOVAL     RECTOCELE REPAIR N/A 06/29/2015   Procedure: POSTERIOR REPAIR (RECTOCELE);  Surgeon: Jonnie Kind, MD;  Location: [facility];  Service: Gynecology;  Laterality: N/A;   REDUCTION MAMMAPLASTY Bilateral    SPINE SURGERY  09/29/2010   Dr. Rolena Infante   surgical excision of 3  tumors from right thigh and right buttock  and left upper thigh  2010   TOOTH EXTRACTION Bilateral 12/14/2014   Procedure: REMOVAL OF BILATERAL MANDIBULAR EXOSTOSES;  Surgeon: Diona Browner, DDS;  Location: [facility];  Service: Oral Surgery;  Laterality: Bilateral;   TRANSTHORACIC ECHOCARDIOGRAM  2010   EF 60-65%, mild conc LVH, grade 1 diastolic dysfunction; mildly calcified MV annulus with mildly thickened leaflets, mildly calcified MR annulus    FAMILY HISTORY: Family History  Problem Relation Age of Onset   Heart attack Mother        HTN   Pneumonia Father    Kidney failure Father    Diabetes Father    Pancreatic cancer Sister    Cancer Sister        breast    Cancer Sister        pancreatic   Diabetes Brother    Hypertension Brother  Diabetes Brother    Alcohol abuse Maternal Uncle    Stroke Maternal Grandmother    Heart attack Maternal Grandfather    Hypertension Son    Sleep apnea Son    Colon cancer Neg Hx    Anesthesia problems Neg Hx    Hypotension Neg Hx    Malignant hyperthermia Neg Hx    Pseudochol deficiency Neg Hx    Breast cancer Neg Hx    Stomach cancer Neg Hx     SOCIAL HISTORY: Social History   Socioeconomic History   Marital status: Divorced    Spouse name: Not on file   Number of children: 1   Years of education: 12   Highest education level: High school graduate  Occupational History   Occupation: Disabled  Tobacco Use   Smoking status: Former    Packs/day: 0.50    Years: 30.00    Pack years: 15.00    Types: Cigarettes    Quit date: 06/11/2020    Years since quitting: 0.4   Smokeless tobacco: Never   Tobacco comments:    quit 7 weeks ago-07/14/2020-AH   Vaping Use   Vaping Use: Never used  Substance and Sexual Activity   Alcohol use: No    Alcohol/week: 0.0 standard drinks    Comment:     Drug use: No   Sexual activity: Not Currently  Other Topics Concern   Not on file  Social History Narrative   01/29/18 Lives alone, has 3 aides,  Mon- Fri 8 hrs, 2 hrs on Sat-Sun, RN manages her meds   Caffeine use: Drink coffee sometimes    Right handed    Social Determinants of Radio broadcast assistant Strain: Low Risk    Difficulty of Paying Living Expenses: Not very hard  Food Insecurity: No Food Insecurity   Worried About Charity fundraiser in the Last Year: Never true   [place] in the Last Year: Never true  Transportation Needs: No Transportation Needs   Lack of Transportation (Medical): No   Lack of Transportation (Non-Medical): No  Physical Activity: Inactive   Days of Exercise per Week: 0 days   Minutes of Exercise per Session: 0 min  Stress: Not on file  Social Connections: Moderately Isolated   Frequency of Communication with Friends and Family: Three times a week   Frequency of Social Gatherings with Friends and Family: Twice a week   Attends Religious Services: More than 4 times per year   Active Member of [employer] or Organizations: No   Attends Archivist Meetings: Never   Marital Status: Divorced  Human resources officer Violence: Not on file      PHYSICAL EXAM  There were no vitals filed for this visit.  There is no height or weight on file to calculate BMI.  Generalized: Well developed, in no acute distress   Neurological examination  Mentation: Alert oriented to time, place, history taking. Follows all commands speech and language fluent Cranial nerve II-XII: Pupils were equal round reactive to light. Extraocular movements were full, visual field were full on confrontational test.  Head turning and shoulder shrug  were normal and symmetric. Motor: The motor testing reveals 5 over 5 strength of all 4 extremities. Good symmetric motor tone is noted throughout.  Sensory: Sensory testing is intact to soft touch on all 4 extremities. No evidence of extinction is noted.  Coordination: Cerebellar testing reveals good finger-nose-finger and heel-to-shin bilaterally.  Gait and station: Gait is  normal. Tandem gait is normal. Romberg is negative. No drift is seen.  Reflexes: Deep tendon reflexes are symmetric and normal bilaterally.   DIAGNOSTIC DATA (LABS, IMAGING, TESTING) - I reviewed patient records, labs, notes, testing and imaging myself where available.  Lab Results  Component Value Date   WBC 6.2 09/15/2020   HGB 10.6 (L) 09/15/2020   HCT 36.5 09/15/2020   MCV 78.5 (L) 09/15/2020   PLT 216 09/15/2020      Component Value Date/Time   NA 144 09/01/2020 1444   K 4.7 09/01/2020 1444   CL 106 09/01/2020 1444   CO2 24 09/01/2020 1444   GLUCOSE 93 09/01/2020 1444   GLUCOSE 90 05/04/2020 1222   BUN 26 09/01/2020 1444   CREATININE 1.05 (H) 09/01/2020 1444   CREATININE 1.24 (H) 09/30/2018 1143   CALCIUM 9.6 09/01/2020 1444   PROT 7.1 09/01/2020 1444   ALBUMIN 4.7 09/01/2020 1444   AST 27 09/01/2020 1444   ALT 27 09/01/2020 1444   ALKPHOS 97 09/01/2020 1444   BILITOT 0.2 09/01/2020 1444   GFRNONAA 53 (L) 05/04/2020 1222   GFRNONAA 45 (L) 09/30/2018 1143   GFRAA 72 12/11/2019 1224   GFRAA 52 (L) 09/30/2018 1143   Lab Results  Component Value Date   CHOL 128 09/01/2020   HDL 40 09/01/2020   LDLCALC 51 09/01/2020   TRIG 232 (H) 09/01/2020   CHOLHDL 3.2 09/01/2020   Lab Results  Component Value Date   HGBA1C 5.8 (A) 07/29/2020   Lab Results  Component Value Date   VITAMINB12 1,408 (H) 01/28/2020   Lab Results  Component Value Date   TSH 0.631 12/11/2019      ASSESSMENT AND PLAN 70 y.o. year old female  has a past medical history of Allergy, Anemia, Anxiety, Arthritis, Assistance needed for mobility, Bipolar disorder ([place]), Blood transfusion, Brain tumor ([place]), Cancer ([place]), Carpal tunnel syndrome of right wrist (05/23/2011), Cervical disc disorder with radiculopathy of cervical region (10/31/2012), Chronic back pain, Chronic idiopathic constipation, Chronic neck and back pain, Colon polyps, COPD (chronic obstructive pulmonary disease) with chronic  bronchitis ([place]) (09/16/2013), Diabetes mellitus, Diverticulosis, Fibromyalgia, Frequent falls, GERD (gastroesophageal reflux disease), Glaucoma, Gum symptoms, Hemiplegia affecting non-dominant side, post-stroke (08/02/2011), Hiatal hernia, Hyperlipidemia, Hypertension, Hypothyroidism, IBS (irritable bowel syndrome), Insomnia, Major depression, recurrent ([place]), Malignant hyperpyrexia (06/15/5911), Metabolic encephalopathy (6/85/9923), Migraines, Mononeuritis lower limb, Narcolepsy, Osteoporosis, Pancreatitis (2006), Schatzki's ring, Seizures ([place]), Sleep apnea, Small bowel obstruction ([place]), Stroke ([place]), and Tubular adenoma of colon. here with:  1.  Seizures  --Continue Lamictal 100 mg twice a day --Blood work today-CBC, CMP and Lamictal level --Advised if she has any seizure events let us know       Ward Givens, MSN, NP-C 11/18/2020, 10:26 AM [facility] [address] [place], [place] [address] [phone]

## 2020-11-18 NOTE — Telephone Encounter (Signed)
Pls call pt and inquire if she had an appt scheduled for eye eval after her recent ED visit since she c/o blurry vision. Note said she was referred . I am following up as , if she still has blurry vision and no appt scheduled, I will need  to refer, pls get name of Md if she needs appt and let me know, I will enter referral, thanks

## 2020-11-18 NOTE — Progress Notes (Signed)
I have read the note, and I agree with the clinical assessment and plan.  Zarianna Dicarlo K Vala Raffo   

## 2020-11-18 NOTE — Patient Instructions (Signed)
Continue lamcital 100 mg twice a day  Blood work today If you have any seizure events please let us know.

## 2020-11-18 NOTE — Assessment & Plan Note (Signed)
Patient in for follow up of recent ED visit. Discharge summary, and laboratory and radiology data are reviewed, and any questions or concerns  are discussed. Specific issues requiring follow up are specifically addressed.Will verify that appt for vision is made Home safety to review reduction in accidents discussed

## 2020-11-18 NOTE — Assessment & Plan Note (Signed)
Increase following recent fall. Short course of robaxin is prescribed

## 2020-11-18 NOTE — Assessment & Plan Note (Signed)
Reports blurry vision following blow to head by desser drawer on 08/04, will establish if persisting still and refer for opthal eval

## 2020-11-18 NOTE — Assessment & Plan Note (Signed)
Uncontrolled, not at [place] and review in next 2 month DASH diet and commitment to daily physical activity for a minimum of 30 minutes discussed and encouraged, as a part of hypertension management. The importance of attaining a healthy weight is also discussed.  BP/Weight 11/18/2020 11/17/2020 11/12/2020 11/10/2020 11/04/2020 [id number]  Systolic BP [id number]  Diastolic BP 71 60 80 65 77 62 59  Wt. (Lbs) 169.4 168.2 169 167.12 169 170.8 169  BMI 34.21 33.97 33.01 32.64 34.13 33.36 33.01  Some encounter information is confidential and restricted. Go to Review Flowsheets activity to see all data.

## 2020-11-19 DIAGNOSIS — H04123 Dry eye syndrome of bilateral lacrimal glands: Secondary | ICD-10-CM | POA: Diagnosis not present

## 2020-11-19 LAB — COMPREHENSIVE METABOLIC PANEL
ALT: 23 IU/L (ref 0–32)
AST: 20 IU/L (ref 0–40)
Albumin/Globulin Ratio: 1.7 (ref 1.2–2.2)
Albumin: 4.5 g/dL (ref 3.8–4.8)
Alkaline Phosphatase: 93 IU/L (ref 44–121)
BUN/Creatinine Ratio: 19 (ref 12–28)
BUN: 19 mg/dL (ref 8–27)
Bilirubin Total: 0.3 mg/dL (ref 0.0–1.2)
CO2: 24 mmol/L (ref 20–29)
Calcium: 9.2 mg/dL (ref 8.7–10.3)
Chloride: 108 mmol/L — ABNORMAL HIGH (ref 96–106)
Creatinine, Ser: 0.98 mg/dL (ref 0.57–1.00)
Globulin, Total: 2.7 g/dL (ref 1.5–4.5)
Glucose: 46 mg/dL — ABNORMAL LOW (ref 65–99)
Potassium: 4.2 mmol/L (ref 3.5–5.2)
Sodium: 146 mmol/L — ABNORMAL HIGH (ref 134–144)
Total Protein: 7.2 g/dL (ref 6.0–8.5)
eGFR: 62 mL/min/{1.73_m2} (ref >59)

## 2020-11-19 LAB — CBC WITH DIFFERENTIAL/PLATELET
Basophils Absolute: 0 10*3/uL (ref 0.0–0.2)
Basos: 1 %
EOS (ABSOLUTE): 0.2 10*3/uL (ref 0.0–0.4)
Eos: 3 %
Hematocrit: 37.9 % (ref 34.0–46.6)
Hemoglobin: 10.8 g/dL — ABNORMAL LOW (ref 11.1–15.9)
Immature Grans (Abs): 0.1 10*3/uL (ref 0.0–0.1)
Immature Granulocytes: 1 %
Lymphocytes Absolute: 2.1 10*3/uL (ref 0.7–3.1)
Lymphs: 29 %
MCH: 22 pg — ABNORMAL LOW (ref 26.6–33.0)
MCHC: 28.5 g/dL — ABNORMAL LOW (ref 31.5–35.7)
MCV: 77 fL — ABNORMAL LOW (ref 79–97)
Monocytes Absolute: 0.7 10*3/uL (ref 0.1–0.9)
Monocytes: 10 %
Neutrophils Absolute: 4.1 10*3/uL (ref 1.4–7.0)
Neutrophils: 56 %
Platelets: 253 10*3/uL (ref 150–450)
RBC: 4.92 x10E6/uL (ref 3.77–5.28)
RDW: 13.1 % (ref 11.7–15.4)
WBC: 7.2 10*3/uL (ref 3.4–10.8)

## 2020-11-19 LAB — LAMOTRIGINE LEVEL: Lamotrigine Lvl: 17.4 ug/mL (ref 2.0–20.0)

## 2020-11-20 ENCOUNTER — Other Ambulatory Visit: Payer: Self-pay | Admitting: Family Medicine

## 2020-11-20 ENCOUNTER — Other Ambulatory Visit: Payer: Self-pay | Admitting: Adult Health

## 2020-11-22 DIAGNOSIS — E039 Hypothyroidism, unspecified: Secondary | ICD-10-CM | POA: Diagnosis not present

## 2020-11-22 DIAGNOSIS — E1142 Type 2 diabetes mellitus with diabetic polyneuropathy: Secondary | ICD-10-CM | POA: Diagnosis not present

## 2020-11-22 DIAGNOSIS — F17210 Nicotine dependence, cigarettes, uncomplicated: Secondary | ICD-10-CM | POA: Diagnosis not present

## 2020-11-22 DIAGNOSIS — F250 Schizoaffective disorder, bipolar type: Secondary | ICD-10-CM | POA: Diagnosis not present

## 2020-11-22 DIAGNOSIS — J449 Chronic obstructive pulmonary disease, unspecified: Secondary | ICD-10-CM | POA: Diagnosis not present

## 2020-11-22 DIAGNOSIS — J42 Unspecified chronic bronchitis: Secondary | ICD-10-CM | POA: Diagnosis not present

## 2020-11-22 DIAGNOSIS — R2681 Unsteadiness on feet: Secondary | ICD-10-CM | POA: Diagnosis not present

## 2020-11-22 DIAGNOSIS — D649 Anemia, unspecified: Secondary | ICD-10-CM | POA: Diagnosis not present

## 2020-11-22 DIAGNOSIS — M160 Bilateral primary osteoarthritis of hip: Secondary | ICD-10-CM | POA: Diagnosis not present

## 2020-11-23 ENCOUNTER — Telehealth: Payer: Self-pay | Admitting: *Deleted

## 2020-11-23 ENCOUNTER — Other Ambulatory Visit: Payer: Medicare HMO

## 2020-11-23 DIAGNOSIS — J449 Chronic obstructive pulmonary disease, unspecified: Secondary | ICD-10-CM | POA: Diagnosis not present

## 2020-11-23 DIAGNOSIS — E039 Hypothyroidism, unspecified: Secondary | ICD-10-CM | POA: Diagnosis not present

## 2020-11-23 DIAGNOSIS — J42 Unspecified chronic bronchitis: Secondary | ICD-10-CM | POA: Diagnosis not present

## 2020-11-23 DIAGNOSIS — R2681 Unsteadiness on feet: Secondary | ICD-10-CM | POA: Diagnosis not present

## 2020-11-23 DIAGNOSIS — F17210 Nicotine dependence, cigarettes, uncomplicated: Secondary | ICD-10-CM | POA: Diagnosis not present

## 2020-11-23 DIAGNOSIS — E1142 Type 2 diabetes mellitus with diabetic polyneuropathy: Secondary | ICD-10-CM | POA: Diagnosis not present

## 2020-11-23 DIAGNOSIS — D649 Anemia, unspecified: Secondary | ICD-10-CM | POA: Diagnosis not present

## 2020-11-23 DIAGNOSIS — M160 Bilateral primary osteoarthritis of hip: Secondary | ICD-10-CM | POA: Diagnosis not present

## 2020-11-23 DIAGNOSIS — F250 Schizoaffective disorder, bipolar type: Secondary | ICD-10-CM | POA: Diagnosis not present

## 2020-11-23 NOTE — Telephone Encounter (Signed)
Spoke to pt and relayed the results of her labs. (Consistent with previous lab work, lamotrigine level normal, glucose low, she is diabetic, takes insulin.  Reminded to eat regular meals and check glucose regularly as well.  She said she gets labwork frequently and iron infusions too. She verbalized understanding. Appreciated call back.

## 2020-11-23 NOTE — Telephone Encounter (Signed)
-----   Message from Ward Givens, NP sent at 11/22/2020  4:07 PM EDT ----- Lab work consistent with previous lab work with the exception of glucose level was low.  Please ensure that the patient is eating regularly and checking her blood sugar

## 2020-11-24 ENCOUNTER — Emergency Department (HOSPITAL_COMMUNITY): Payer: Medicare HMO

## 2020-11-24 ENCOUNTER — Emergency Department (HOSPITAL_COMMUNITY)
Admission: EM | Admit: 2020-11-24 | Discharge: 2020-11-24 | Disposition: A | Discharge status: Home / Self Care | Payer: Medicare HMO | Attending: Emergency Medicine | Admitting: Emergency Medicine

## 2020-11-24 ENCOUNTER — Other Ambulatory Visit: Payer: Self-pay

## 2020-11-24 ENCOUNTER — Ambulatory Visit (INDEPENDENT_AMBULATORY_CARE_PROVIDER_SITE_OTHER): Payer: Medicare HMO | Admitting: Family Medicine

## 2020-11-24 ENCOUNTER — Encounter (HOSPITAL_COMMUNITY): Payer: Self-pay

## 2020-11-24 DIAGNOSIS — I10 Essential (primary) hypertension: Secondary | ICD-10-CM | POA: Diagnosis not present

## 2020-11-24 DIAGNOSIS — Z85818 Personal history of malignant neoplasm of other sites of lip, oral cavity, and pharynx: Secondary | ICD-10-CM | POA: Diagnosis not present

## 2020-11-24 DIAGNOSIS — Z87891 Personal history of nicotine dependence: Secondary | ICD-10-CM | POA: Insufficient documentation

## 2020-11-24 DIAGNOSIS — R4182 Altered mental status, unspecified: Secondary | ICD-10-CM | POA: Diagnosis not present

## 2020-11-24 DIAGNOSIS — Z86011 Personal history of benign neoplasm of the brain: Secondary | ICD-10-CM | POA: Insufficient documentation

## 2020-11-24 DIAGNOSIS — R410 Disorientation, unspecified: Secondary | ICD-10-CM | POA: Diagnosis not present

## 2020-11-24 DIAGNOSIS — Z20822 Contact with and (suspected) exposure to covid-19: Secondary | ICD-10-CM | POA: Insufficient documentation

## 2020-11-24 DIAGNOSIS — G9389 Other specified disorders of brain: Secondary | ICD-10-CM | POA: Diagnosis not present

## 2020-11-24 DIAGNOSIS — R404 Transient alteration of awareness: Secondary | ICD-10-CM | POA: Diagnosis not present

## 2020-11-24 DIAGNOSIS — J449 Chronic obstructive pulmonary disease, unspecified: Secondary | ICD-10-CM | POA: Insufficient documentation

## 2020-11-24 DIAGNOSIS — R519 Headache, unspecified: Secondary | ICD-10-CM | POA: Insufficient documentation

## 2020-11-24 DIAGNOSIS — E039 Hypothyroidism, unspecified: Secondary | ICD-10-CM | POA: Diagnosis not present

## 2020-11-24 LAB — COMPREHENSIVE METABOLIC PANEL
ALT: 25 U/L (ref 0–44)
AST: 24 U/L (ref 15–41)
Albumin: 4.3 g/dL (ref 3.5–5.0)
Alkaline Phosphatase: 108 U/L (ref 38–126)
Anion gap: 9 (ref 5–15)
BUN: 24 mg/dL — ABNORMAL HIGH (ref 8–23)
CO2: 26 mmol/L (ref 22–32)
Calcium: 9.5 mg/dL (ref 8.9–10.3)
Chloride: 107 mmol/L (ref 98–111)
Creatinine, Ser: 1.1 mg/dL — ABNORMAL HIGH (ref 0.44–1.00)
GFR, Estimated: 54 mL/min — ABNORMAL LOW (ref >60)
Glucose, Bld: 101 mg/dL — ABNORMAL HIGH (ref 70–99)
Potassium: 4.1 mmol/L (ref 3.5–5.1)
Sodium: 142 mmol/L (ref 135–145)
Total Bilirubin: 0.2 mg/dL — ABNORMAL LOW (ref 0.3–1.2)
Total Protein: 7.8 g/dL (ref 6.5–8.1)

## 2020-11-24 LAB — RESP PANEL BY RT-PCR (FLU A&B, COVID) ARPGX2
Influenza A by PCR: NEGATIVE
Influenza B by PCR: NEGATIVE
SARS Coronavirus 2 by RT PCR: NEGATIVE

## 2020-11-24 LAB — CBC WITH DIFFERENTIAL/PLATELET
Abs Immature Granulocytes: 0.02 10*3/uL (ref 0.00–0.07)
Basophils Absolute: 0 10*3/uL (ref 0.0–0.1)
Basophils Relative: 0 %
Eosinophils Absolute: 0.2 10*3/uL (ref 0.0–0.5)
Eosinophils Relative: 3 %
HCT: 39.6 % (ref 36.0–46.0)
Hemoglobin: 11.3 g/dL — ABNORMAL LOW (ref 12.0–15.0)
Immature Granulocytes: 0 %
Lymphocytes Relative: 28 %
Lymphs Abs: 1.6 10*3/uL (ref 0.7–4.0)
MCH: 22.9 pg — ABNORMAL LOW (ref 26.0–34.0)
MCHC: 28.5 g/dL — ABNORMAL LOW (ref 30.0–36.0)
MCV: 80.2 fL (ref 80.0–100.0)
Monocytes Absolute: 0.6 10*3/uL (ref 0.1–1.0)
Monocytes Relative: 10 %
Neutro Abs: 3.5 10*3/uL (ref 1.7–7.7)
Neutrophils Relative %: 59 %
Platelets: 235 10*3/uL (ref 150–400)
RBC: 4.94 MIL/uL (ref 3.87–5.11)
RDW: 14.4 % (ref 11.5–15.5)
WBC: 5.8 10*3/uL (ref 4.0–10.5)
nRBC: 0 % (ref 0.0–0.2)

## 2020-11-24 LAB — URINALYSIS, ROUTINE W REFLEX MICROSCOPIC
Bacteria, UA: NONE SEEN
Bilirubin Urine: NEGATIVE
Glucose, UA: NEGATIVE mg/dL
Ketones, ur: NEGATIVE mg/dL
Leukocytes,Ua: NEGATIVE
Nitrite: NEGATIVE
Protein, ur: 30 mg/dL — AB
Specific Gravity, Urine: 1.013 (ref 1.005–1.030)
pH: 6 (ref 5.0–8.0)

## 2020-11-24 LAB — TROPONIN I (HIGH SENSITIVITY)
Troponin I (High Sensitivity): 5 ng/L (ref <18)
Troponin I (High Sensitivity): 4 ng/L (ref <18)

## 2020-11-24 LAB — LIPASE, BLOOD: Lipase: 31 U/L (ref 11–51)

## 2020-11-24 MED ORDER — SODIUM CHLORIDE 0.9 % IV BOLUS
500.0000 mL | Freq: Once | INTRAVENOUS | Status: AC
  Administered 2020-11-24: 500 mL via INTRAVENOUS

## 2020-11-24 NOTE — Discharge Instructions (Signed)
You were seen in the emergency room today with increased confusion and intermittent headaches.  Your CT scans, labs, MRI imaging was normal.  This may be related to your recent concussion.  I have listed the name of a local neurologist.  Please call the office to schedule a follow-up appointment.  Please follow closely with your primary care doctor.  Return to the emergency department any new or suddenly worsening symptoms.

## 2020-11-24 NOTE — ED Provider Notes (Signed)
Emergency Department Provider Note   I have reviewed the triage vital signs and the nursing notes.   HISTORY  Chief Complaint Altered Mental Status   HPI [name] is a 70 y.o. female with past medical history reviewed below presents to the emergency department with her aide for some recent confusion and intermittent headaches.  Patient saw her PCP today in the office who directed her to the emergency department with the symptoms.  Patient was seen in the emergency department back in August after a fall with head injury.  At that time she was diagnosed with a concussion.  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  She is not having any unilateral weakness or numbness.  She denies any recent medication changes.  Since the fall in August she has had some blurry vision in the left visual field and is working with her PCP regarding ophthalmology follow-up.  No worsening in her vision recently. No CP or syncope.    Past Medical History:  Diagnosis Date   Allergy    Anemia    Anxiety    takes Ativan daily   Arthritis    Assistance needed for mobility    Bipolar disorder ([place])    takes Risperdal nightly   Blood transfusion    Brain tumor ([place])    Cancer ([place])    In her gum   Carpal tunnel syndrome of right wrist 05/23/2011   Cervical disc disorder with radiculopathy of cervical region 10/31/2012   Chronic back pain    Chronic idiopathic constipation    Chronic neck and back pain    Colon polyps    COPD (chronic obstructive pulmonary disease) with chronic bronchitis ([place]) 09/16/2013   Office Spirometry 10/30/2013-submaximal effort based on appearance of loop and curve. Numbers would fit with severe restriction but her physiologic capability may be better than this. FVC 0.91/44%, and 10.74/45%, FEV1/FVC 0.81, FEF 25-75% 1.43/69%      Diabetes mellitus    Type II   Diverticulosis    TCS 9/08 by Dr. Delfin Edis for diarrhea . Bx for micro scopic colitis negative.    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   Metabolic encephalopathy [id number]   Migraines    chronic headaches   Mononeuritis lower limb    Narcolepsy    Osteoporosis    Pancreatitis 2006   due to Depakote with normal EUS    Schatzki's ring    non critical / EGD with ED 8/2011with RMR   Seizures ([place])    takes Lamictal daily.Last seizure 3 yrs ago   Sleep apnea    on CPAP   Small bowel obstruction ([place])    Stroke ([place])  left sided weakness, speech changes   Tubular adenoma of colon     Patient Active Problem List   Diagnosis Date Noted   Confusion 11/24/2020   Blurry vision 11/18/2020   Cervical radiculopathy 07/09/2020   Closed fracture of lateral malleolus of right fibula 02/27/2020   Tubular adenoma of colon 02/09/2019   Benign neoplasm of cerebral meninges (HCC) 11/12/2018   Benign meningioma of brain ([place]) 10/31/2018   Osteoarthritis of both hips 07/06/2018   Lipoma of extremity 12/31/2017   Impingement syndrome of left shoulder region 12/27/2017   Encounter for examination following treatment at hospital 11/25/2017   Leg weakness, bilateral 10/28/2017   Lumbar spondylosis with myelopathy 10/12/2017   Numbness of hand 10/10/2017   Chronic neck pain 07/25/2017   Chronic pain syndrome 07/25/2017   At risk  for cardiovascular event 06/10/2017   Diabetic polyneuropathy associated with type 2 diabetes mellitus ([place]) 05/19/2016   Obesity (BMI 30.0-34.9) 04/24/2016   History of palpitations 08/09/2015   Labile hypertension 08/03/2015   Normal coronary arteries 08/03/2015   Dizziness 07/15/2015   Left-sided low back pain with left-sided sciatica 06/27/2015   Multinodular goiter 05/06/2015   Rectocele, female 04/27/2015   Anal sphincter incontinence 04/27/2015   Pelvic relaxation due to rectocele 03/30/2015   Pulmonary hypertension ([place]) 02/22/2015   Migraine without aura and without status migrainosus, not intractable 07/02/2014   Flatulence 02/18/2014   Microcytic anemia 02/18/2014   COPD mixed type ([place]) 09/16/2013   Hypothyroidism 08/16/2013   Gastroparesis 04/28/2013   Seizure disorder ([place]) 01/19/2013   Displacement of cervical intervertebral disc without myelopathy 12/13/2012   Bursitis of shoulder 12/13/2012   Cervical disc disorder with radiculopathy of cervical region 10/31/2012   Solitary pulmonary nodule 08/19/2012   Anemia 07/05/2012   Hypersomnia disorder related to a known organic factor 06/11/2012   Pruritus 04/18/2012   Meningioma ([place]) 11/19/2011   Mononeuritis leg 10/25/2011   Carpal tunnel syndrome of right wrist 05/23/2011   Chronic pain of right hand 05/04/2011   Polypharmacy 04/28/2011   Bipolar disorder ([place]) 04/28/2011   Constipation 04/13/2011   Recurrent falls 12/12/2010   Oropharyngeal dysphagia 07/12/2010   Urinary incontinence 12/16/2009   HEARING LOSS 10/26/2009   Well controlled type 2 diabetes mellitus with gastroparesis ([place]) 07/07/2009   Hyperlipidemia 12/11/2008   IBS 12/11/2008   GERD 07/29/2008   MILK PRODUCTS ALLERGY 07/29/2008   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/03/2007   Essential hypertension 06/27/2007   Backache 06/19/2007   Osteoporosis 06/19/2007   Obstructive sleep apnea 06/19/2007   TRIGGER FINGER  04/18/2007   DIVERTICULOSIS, COLON 11/13/2006    Past Surgical History:  Procedure Laterality Date   ABDOMINAL HYSTERECTOMY  1978   BACK SURGERY  July 2012   BACTERIAL OVERGROWTH TEST N/A 05/05/2013   Procedure: BACTERIAL OVERGROWTH TEST;  Surgeon: Daneil Dolin, MD;  [facility];  Service: Endoscopy;  Laterality: N/A;  7:30   BIOPSY THYROID  2009   BRAIN SURGERY  11/2011   resection of meningioma   BREAST REDUCTION SURGERY  1994   CARDIAC CATHETERIZATION  05/10/2005   normal coronaries, normal LV systolic function and EF (Dr. Jackie Plum)   [place] Left 07/22/04   Dr. Aline Brochure   CATARACT EXTRACTION Bilateral    CHOLECYSTECTOMY  1984   COLONOSCOPY N/A 09/25/2012   [id number] diverticulosis.  colonic polyp-removed : tubular adenoma   CRANIOTOMY  11/23/2011   Procedure: CRANIOTOMY TUMOR EXCISION;  Surgeon: Okey Regal  Sherwood Gambler, MD;  Location: [facility];  Service: Neurosurgery;  Laterality: N/A;  Craniotomy for tumor resection   ESOPHAGOGASTRODUODENOSCOPY  12/29/2010   [place]-Retained food in the esophagus and stomach, small hiatal hernia, status post Maloney dilation of the esophagus   ESOPHAGOGASTRODUODENOSCOPY N/A 09/25/2012   [id number] atonic baggy esophagus status post Maloney dilation 43 F. Hiatal hernia   GIVENS CAPSULE STUDY N/A 01/15/2013   NORMAL.    IR GENERIC HISTORICAL  03/17/2016   IR RADIOLOGIST EVAL & MGMT 03/17/2016 [facility]INTER[facility] RAD   LESION REMOVAL N/A 05/31/2015   Procedure: REMOVAL RIGHT AND LEFT LESIONS OF MANDIBLE;  Surgeon: Diona Browner, DDS;  Location: [facility];  Service: Oral Surgery;  Laterality: N/A;   MALONEY DILATION  12/29/2010   RMR;   NM MYOCAR PERF WALL MOTION  2006   "relavtiely normal" persantine, mild anterior thinning (breast attenuation artifact), no region of scar/ischemia   OVARIAN CYST REMOVAL     RECTOCELE REPAIR N/A 06/29/2015   Procedure: POSTERIOR REPAIR (RECTOCELE);  Surgeon: Jonnie Kind, MD;  Location: [facility];  Service:  Gynecology;  Laterality: N/A;   REDUCTION MAMMAPLASTY Bilateral    SPINE SURGERY  09/29/2010   Dr. Rolena Infante   surgical excision of 3 tumors from right thigh and right buttock  and left upper thigh  2010   TOOTH EXTRACTION Bilateral 12/14/2014   Procedure: REMOVAL OF BILATERAL MANDIBULAR EXOSTOSES;  Surgeon: Diona Browner, DDS;  Location: [facility];  Service: Oral Surgery;  Laterality: Bilateral;   TRANSTHORACIC ECHOCARDIOGRAM  2010   EF 60-65%, mild conc LVH, grade 1 diastolic dysfunction; mildly calcified MV annulus with mildly thickened leaflets, mildly calcified MR annulus    Allergies Lac bovis, Phenazopyridine hcl, Cephalexin, Flonase [fluticasone], Iron, Milk-related compounds, Penicillins, and Phenazopyridine hcl  Family History  Problem Relation Age of Onset   Heart attack Mother        HTN   Pneumonia Father    Kidney failure Father    Diabetes Father    Pancreatic cancer Sister    Cancer Sister        breast    Cancer Sister        pancreatic   Diabetes Brother    Hypertension Brother    Diabetes Brother    Alcohol abuse Maternal Uncle    Stroke Maternal Grandmother    Heart attack Maternal Grandfather    Hypertension Son    Sleep apnea Son    Colon cancer Neg Hx    Anesthesia problems Neg Hx    Hypotension Neg Hx    Malignant hyperthermia Neg Hx    Pseudochol deficiency Neg Hx    Breast cancer Neg Hx    Stomach cancer Neg Hx     Social History Social History   Tobacco Use   Smoking status: Former    Packs/day: 0.50    Years: 30.00    Pack years: 15.00    Types: Cigarettes    Quit date: 06/11/2020    Years since quitting: 0.4   Smokeless tobacco: Never   Tobacco comments:    quit 7 weeks ago-07/14/2020-AH   Vaping Use   Vaping Use: Never used  Substance Use Topics   Alcohol use: No    Alcohol/week: 0.0 standard drinks    Comment:     Drug use: No    Review of Systems  Constitutional: No fever/chills Eyes: No visual changes. ENT: No sore  throat. Cardiovascular: Denies chest pain. Respiratory: Denies shortness of breath. Gastrointestinal: No abdominal  pain.  No nausea, no vomiting.  No diarrhea.  No constipation. Genitourinary: Negative for dysuria. Musculoskeletal: Negative for back pain. Skin: Negative for rash. Neurological: Negative for focal weakness or numbness. Positive intermittent HA and confusion.   10-point ROS otherwise negative.  ____________________________________________   PHYSICAL EXAM:  VITAL SIGNS: ED Triage Vitals  Enc Vitals Group     BP 11/24/20 1240 (!) 159/63     Pulse Rate 11/24/20 1240 61     Resp 11/24/20 1240 18     Temp 11/24/20 1240 98.2 F (36.8 C)     Temp Source 11/24/20 1240 Oral     SpO2 11/24/20 1240 100 %     Weight 11/24/20 1235 170 lb (77.1 kg)     Height 11/24/20 1235 '4\' 11"'$  (1.499 m)   Constitutional: Alert and oriented x 3. Well appearing and in no acute distress. Eyes: Conjunctivae are normal. PERRL.  Head: Atraumatic. Nose: No congestion/rhinnorhea. Mouth/Throat: Mucous membranes are moist.   Neck: No stridor.  Cardiovascular: Normal rate, regular rhythm. Good peripheral circulation. Grossly normal heart sounds.   Respiratory: Normal respiratory effort.  No retractions. Lungs CTAB. Gastrointestinal: Soft and nontender. No distention.  Musculoskeletal: No lower extremity tenderness nor edema. No gross deformities of extremities. Neurologic:  Normal speech and language. No gross focal neurologic deficits are appreciated. No facial asymmetry. 5/5 strength in the bilateral upper/lower extremities.  Skin:  Skin is warm, dry and intact. No rash noted.   ____________________________________________   LABS (all labs ordered are listed, but only abnormal results are displayed)  Labs Reviewed  URINE CULTURE - Abnormal; Notable for the following components:      Result Value   Culture MULTIPLE SPECIES PRESENT, SUGGEST RECOLLECTION (*)    All other components within  normal limits  COMPREHENSIVE METABOLIC PANEL - Abnormal; Notable for the following components:   Glucose, Bld 101 (*)    BUN 24 (*)    Creatinine, Ser 1.10 (*)    Total Bilirubin 0.2 (*)    GFR, Estimated 54 (*)    All other components within normal limits  CBC WITH DIFFERENTIAL/PLATELET - Abnormal; Notable for the following components:   Hemoglobin 11.3 (*)    MCH 22.9 (*)    MCHC 28.5 (*)    All other components within normal limits  URINALYSIS, ROUTINE W REFLEX MICROSCOPIC - Abnormal; Notable for the following components:   Hgb urine dipstick MODERATE (*)    Protein, ur 30 (*)    All other components within normal limits  RESP PANEL BY RT-PCR (FLU A&B, COVID) [id number]  LIPASE, BLOOD  TROPONIN I (HIGH SENSITIVITY)  TROPONIN I (HIGH SENSITIVITY)     ____________________________________________  RADIOLOGY  CT HEAD WO CONTRAST (5MM)  Result Date: 11/24/2020 CLINICAL DATA:  Mental status change, unknown cause. EXAM: CT HEAD WITHOUT CONTRAST TECHNIQUE: Contiguous axial images were obtained from the base of the skull through the vertex without intravenous contrast. COMPARISON:  10/16/2020 head CT and 04/22/2020 head MRI FINDINGS: Brain: There is no evidence of an acute infarct, intracranial hemorrhage, midline shift, or extra-axial fluid collection. Postoperative changes are again noted from a meningioma resection with encephalomalacia anteriorly in both frontal lobes. A small amount of soft tissue is again seen along the planum sphenoidale corresponding to the known residual meningioma. The ventricles are normal in size for age. Hypodensities in the cerebral white matter and pons are unchanged and nonspecific but compatible with chronic small vessel ischemic disease. Vascular: Calcified atherosclerosis at the skull base. No  hyperdense vessel. Skull: Bifrontal craniotomy.  No fracture. Sinuses/Orbits: Paranasal sinuses and mastoid air cells are clear. Bilateral cataract extraction. Other:  None. IMPRESSION: 1. No evidence of acute intracranial abnormality. 2. Postoperative changes from meningioma resection as above. Electronically Signed   By: Logan Bores M.D.   On: 11/24/2020 14:30   DG Chest Portable 1 View  Result Date: 11/24/2020 CLINICAL DATA:  Altered mental status. EXAM: PORTABLE CHEST 1 VIEW COMPARISON:  Chest radiograph 02/20/2020 and CT 07/14/2020 FINDINGS: The cardiac silhouette is accentuated by rightward patient rotation and hypoinflation. Lung volumes are lower than on the prior radiograph with bronchovascular crowding and platelike atelectasis or scarring in the right mid lung. The interstitial markings are mildly increased without overt edema. No segmental airspace consolidation, sizeable pleural effusion, or pneumothorax is identified. No acute osseous abnormality is seen. IMPRESSION: Low lung volumes with atelectasis and/or scarring. Electronically Signed   By: Logan Bores M.D.   On: 11/24/2020 13:34    ____________________________________________   PROCEDURES  Procedure(s) performed:   Procedures  None  ____________________________________________   INITIAL IMPRESSION / ASSESSMENT AND PLAN / ED COURSE  Pertinent labs & imaging results that were available during my care of the patient were reviewed by me and considered in my medical decision making (see chart for details).   Patient presents to the emergency department with increased forgetfulness and intermittent HA.  Patient sent by her PCP who discussed the case with me by phone.  Do plan for CT imaging of the head to rule out subacute hemorrhage ie SDH.  No focal neurodeficits on exam to suspect stroke.  No clear polypharmacy issues/medication dosing changes recently to prompt symptoms.  We will check for urinary tract infection, electrolyte derangement, acute kidney injury with labs and UA. Patient was diagnosed with concussion in August and symptoms could be related.   02:45 PM  CT imaging of the  head and chest x-ray reviewed without acute finding.  No evidence of urinary tract infection.  No evidence of kidney injury, electrolyte disturbance, or troponin elevation.  Symptoms may be related to postconcussion syndrome and patient will likely benefit from outpatient neurology follow-up.  Do plan for MRI brain without contrast to evaluate for possible acute infarct. If negative, plan for d/c home with PCP and Neurology follow up.   MRI negative for acute process. Plan for Neuro follow up and PCP follow up. Aid at bedside. Information and follow up plan provided at discharge.  ____________________________________________  FINAL CLINICAL IMPRESSION(S) / ED DIAGNOSES  Final diagnoses:  Transient alteration of awareness     MEDICATIONS GIVEN DURING THIS VISIT:  Medications  sodium chloride 0.9 % bolus 500 mL (0 mLs Intravenous Stopped 11/24/20 1606)     Note:  This document was prepared using Dragon voice recognition software and may include unintentional dictation errors.  Nanda Quinton, MD, [facility] Emergency Medicine    Cartier Washko, [facility], MD 11/28/20 [phone]

## 2020-11-24 NOTE — Telephone Encounter (Signed)
Pt did see [facility]

## 2020-11-24 NOTE — Patient Instructions (Signed)
You need to go to the ED for further evaluation of your new confusion and unsteady gait.  I am also going to have neurology follow up with you re memory loss and imbalance.  Thanks for choosing [facility], we consider it a privelige to serve you.  F/U will be scheduled for 2 to 3 weeks

## 2020-11-24 NOTE — ED Triage Notes (Signed)
Pt to er room number 98, pt states that she is here for confusion, care giver with pt, states that they first noticed the confusion yesterday, states that she was also having some trouble walking.  Pt awake and oriented to person, place and day of the week

## 2020-11-25 ENCOUNTER — Ambulatory Visit: Payer: Medicare HMO | Admitting: Family Medicine

## 2020-11-26 ENCOUNTER — Telehealth: Payer: Self-pay

## 2020-11-26 DIAGNOSIS — M4716 Other spondylosis with myelopathy, lumbar region: Secondary | ICD-10-CM | POA: Diagnosis not present

## 2020-11-26 DIAGNOSIS — R29898 Other symptoms and signs involving the musculoskeletal system: Secondary | ICD-10-CM | POA: Diagnosis not present

## 2020-11-26 LAB — URINE CULTURE

## 2020-11-26 NOTE — Telephone Encounter (Signed)
Pt informed of follow up visit 12/23/20 @ 1:40pm

## 2020-11-26 NOTE — Telephone Encounter (Signed)
Patient returning call. Was told to call office back.

## 2020-11-29 ENCOUNTER — Ambulatory Visit (INDEPENDENT_AMBULATORY_CARE_PROVIDER_SITE_OTHER): Payer: Medicare HMO | Admitting: Psychiatry

## 2020-11-29 ENCOUNTER — Encounter: Payer: Self-pay | Admitting: Family Medicine

## 2020-11-29 ENCOUNTER — Other Ambulatory Visit: Payer: Self-pay

## 2020-11-29 DIAGNOSIS — F331 Major depressive disorder, recurrent, moderate: Secondary | ICD-10-CM

## 2020-11-29 NOTE — Assessment & Plan Note (Signed)
3 day h/o confusion s/p head trauama, initial scan showed no brain injury, due to acte deterioration refer Ed foreval

## 2020-11-29 NOTE — Progress Notes (Signed)
Virtual Visit via Telephone Note  I connected with [name] on 11/29/20 at 4:32 PM EDT by telephone and verified that I am speaking with the correct person using two identifiers.  Location: Patient: Home Provider: [place] office    I discussed the limitations, risks, security and privacy concerns of performing an evaluation and management service by telephone and the availability of in person appointments. I also discussed with the patient that there may be a patient responsible charge related to this service. The patient expressed understanding and agreed to proceed.    I provided 42 minutes of non-face-to-face time during this encounter.   [name], LCSW              THERAPIST PROGRESS NOTE    Session Time: Monday 11/29/2020 4:33 PM - 5:15 PM  Participation Level: Active  Behavioral Response:  Less depressed, talkative, alert   Type of Therapy: Individual Therapy  Treatment Goals addressed: Patient wants to learn how to improve coping skills to manage chronic pain and health issues/ improve mood    Interventions: CBT and Supportive             Summary: [name] is a 70 y.o. female who presents with  long standing history of recurrent periods  of depression beginning when she was [age] and her favorite uncle died. Patient reports multiple psychiatric hospitlaizations due to depression and suicidal ideations with the last one occuring in 1997. Patient has participated in outpatient psychotherapy and medication management intermittently since age 32.  She currently is seeing psychiatrist Dr. Harrington Challenger . Prior to this, she was seen at [facility]. Patient also has had ECT at [facility]. Symptoms have worsened in recent months due to family stress and have  included depressed mood, anxiety, excessive worry, and tearfulness.         Patient's last contact was by virtual visit via telephone about 2-3  weeks ago.  She reports decreased symptoms of depression and  coping better.  She reports pain seems to be her major trigger of depression.  She says she tries not to let her thoughts go too far.  She reports increased pain recently and says she has resumed smoking when pain becomes intense.  However, she reports a cigarette will last her for about a week.  She continues to try to remain involved in activities especially attending church.  She expresses frustration as she sometimes fears falling when she is getting into the [place].  Per her report, Actor do not have the stepstool close enough to the [place].  She continues to express frustration her son is not visiting her more frequently.  Patient reports receiving handouts via mail but does not have them available for today's session.    Suicidal/Homicidal: Nowithout intent/plan      Therapist Response: Reviewed symptoms, praised and reinforced patient's efforts to maintain involvement in activities, patient reinforced patient's increased awareness of her thoughts and her efforts to avoid spiraling, assisted patient identify ways to improve assertiveness skills to express concerns to [employer], discussed possible script, began to elaborate on mindfulness, to please locate handouts and have available for next session   Diagnosis: Axis I: MDD, Recurrent, moderate    Axis II: Deferred    [name] Malina E Brizeida Mcmurry, LCSW

## 2020-11-29 NOTE — Progress Notes (Signed)
   [name]     MRN: [id number]      DOB: June 21, 1950   HPI [name] is here with a home health aide who has been with her for 3 days,reporting increased confusion and forgetfulness as well as excessive sleepiness over the past 2 days. [name] states she has been getting confused with the days , thinking every day is Sunday, getting ready for [facility] States swelling on the back of her head is getting smaller but concerned re new symptoms  ROS Denies recent fever or chills. Denies sinus pressure, nasal congestion, ear pain or sore throat. Denies chest congestion, productive cough or wheezing. Denies chest pains, palpitations and leg swelling Denies abdominal pain, nausea, vomiting,diarrhea or constipation.   Denies dysuria, frequency, hesitancy or incontinence. Denies depression, anxiety or insomnia. Denies skin break down or rash.   PE BP (!) 150/67   Pulse 64   Ht '4\' 11"'$  (1.499 m)   Wt 170 lb (77.1 kg)   BMI 34.34 kg/m    Patient drowsy and in no CP distress HEENT: No facial asymmetry, EOMI,     Neck supple .  Chest: Clear to auscultation bilaterally.  CVS: S1, S2 no murmurs, no S3.Regular rate.  ABD: Soft non tender.   Ext: No edema  MS: decreased  ROM spine, shoulders, hips and knees.  Skin: Intact, no ulcerations or rash noted.Hematoma on posterior scalp markedly reduced in size   CNS: CN 2-12 intact, power,  normal throughout.no focal deficits noted.   Assessment & Plan  Confusion 3 day h/o confusion s/p head trauama, initial scan showed no brain injury, due to acte deterioration refer Ed foreval

## 2020-11-30 ENCOUNTER — Telehealth: Payer: Self-pay

## 2020-11-30 DIAGNOSIS — E1142 Type 2 diabetes mellitus with diabetic polyneuropathy: Secondary | ICD-10-CM | POA: Diagnosis not present

## 2020-11-30 DIAGNOSIS — J449 Chronic obstructive pulmonary disease, unspecified: Secondary | ICD-10-CM | POA: Diagnosis not present

## 2020-11-30 DIAGNOSIS — E039 Hypothyroidism, unspecified: Secondary | ICD-10-CM | POA: Diagnosis not present

## 2020-11-30 DIAGNOSIS — J42 Unspecified chronic bronchitis: Secondary | ICD-10-CM | POA: Diagnosis not present

## 2020-11-30 DIAGNOSIS — F17210 Nicotine dependence, cigarettes, uncomplicated: Secondary | ICD-10-CM | POA: Diagnosis not present

## 2020-11-30 DIAGNOSIS — M160 Bilateral primary osteoarthritis of hip: Secondary | ICD-10-CM | POA: Diagnosis not present

## 2020-11-30 DIAGNOSIS — R2681 Unsteadiness on feet: Secondary | ICD-10-CM | POA: Diagnosis not present

## 2020-11-30 DIAGNOSIS — F250 Schizoaffective disorder, bipolar type: Secondary | ICD-10-CM | POA: Diagnosis not present

## 2020-11-30 DIAGNOSIS — D649 Anemia, unspecified: Secondary | ICD-10-CM | POA: Diagnosis not present

## 2020-11-30 NOTE — Progress Notes (Signed)
Cardiology Office Note    Date:  12/06/2020   ID:  [name], DOB 15-Feb-1951, MRN [id number]   PCP:  [name], MD   [facility]  Cardiologist:  Kate Sable, MD (Inactive)   Advanced Practice Provider:  No care team member to display Electrophysiologist:  None   [phone]   Chief Complaint  Patient presents with   Follow-up     History of Present Illness:  [name] is a 70 y.o. female  with history of labile hypertension, hyperlipidemia, history of chest pain with normal cardiac cath in 2007, low risk nuclear stress test 07/2017, OSA on CPAP followed by pulmonary, anxiety depression   Patient had ultrasound for leg swelling 02/27/2020 that showed no DVT but mild to moderate superficial edema and entire medial calf on the right no common femoral vein obstruction on the left.   I saw the patient 06/14/2020 at which time she complained of wheezing while sweeping the floors.   Patient was seen in the ED 11/24/2020 with transient alternation of awareness. Had a concussion in August.  CT no acute process MRI was negative for acute process.  She was referred to neurology.  Patient comes in for f/u. Still has some confusion. Complains of wheezing and heart racing with cleaning her house or doing things she's not supposed to do. It's about the same as it was. She doesn't know what meds she has but has a nurse fill her meds for her. An aide brought her here.  Past Medical History:  Diagnosis Date   Allergy    Anemia    Anxiety    takes Ativan daily   Arthritis    Assistance needed for mobility    Bipolar disorder ([place])    takes Risperdal nightly   Blood transfusion    Brain tumor ([place])    Cancer ([place])    In her gum   Carpal tunnel syndrome of right wrist 05/23/2011   Cervical disc disorder with radiculopathy of cervical region 10/31/2012   Chronic back pain    Chronic idiopathic constipation    Chronic neck and back pain    Colon  polyps    COPD (chronic obstructive pulmonary disease) with chronic bronchitis ([place]) 09/16/2013   Office Spirometry 10/30/2013-submaximal effort based on appearance of loop and curve. Numbers would fit with severe restriction but her physiologic capability may be better than this. FVC 0.91/44%, and 10.74/45%, FEV1/FVC 0.81, FEF 25-75% 1.43/69%     Diabetes mellitus    Type II   Diverticulosis    TCS 9/08 by Dr. Delfin Edis for diarrhea . Bx for micro scopic colitis negative.    Fibromyalgia    Frequent falls    GERD (gastroesophageal reflux disease)    takes Aciphex daily   Glaucoma    eye drops daily   Gum symptoms    infection on antibiotic   Hemiplegia affecting non-dominant side, post-stroke 08/02/2011   Hiatal hernia    Hyperlipidemia    takes Crestor daily   Hypertension    takes Amlodipine,Metoprolol,and Clonidine daily   Hypothyroidism    takes Synthroid daily   IBS (irritable bowel syndrome)    Insomnia    takes Trazodone nightly   Major depression, recurrent ([place])    takes Zoloft daily   Malignant hyperpyrexia 9/50/9326   Metabolic encephalopathy 09/22/4578   Migraines    chronic headaches   Mononeuritis lower limb    Narcolepsy    Osteoporosis  Pancreatitis 2006   due to Depakote with normal EUS    Schatzki's ring    non critical / EGD with ED 8/2011with RMR   Seizures ([place])    takes Lamictal daily.Last seizure 3 yrs ago   Sleep apnea    on CPAP   Small bowel obstruction (H[facility])    Stroke ([place])    left sided weakness, speech changes   Tubular adenoma of colon     Past Surgical History:  Procedure Laterality Date   ABDOMINAL HYSTERECTOMY  1978   BACK SURGERY  July 2012   BACTERIAL OVERGROWTH TEST N/A 05/05/2013   Procedure: BACTERIAL OVERGROWTH TEST;  Surgeon: Daneil Dolin, MD;  [facility];  Service: Endoscopy;  Laterality: N/A;  7:30   BIOPSY THYROID  2009   BRAIN SURGERY  11/2011   resection of meningioma   BREAST REDUCTION SURGERY  1994    CARDIAC CATHETERIZATION  05/10/2005   normal coronaries, normal LV systolic function and EF (Dr. Jackie Plum)   [place] Left 07/22/04   Dr. Aline Brochure   CATARACT EXTRACTION Bilateral    CHOLECYSTECTOMY  1984   COLONOSCOPY N/A 09/25/2012   YWV:PXTGGYI diverticulosis.  colonic polyp-removed : tubular adenoma   CRANIOTOMY  11/23/2011   Procedure: CRANIOTOMY TUMOR EXCISION;  Surgeon: Hosie Spangle, MD;  Location: [facility] OR[facility];  Service: Neurosurgery;  Laterality: N/A;  Craniotomy for tumor resection   ESOPHAGOGASTRODUODENOSCOPY  12/29/2010   [place]-Retained food in the esophagus and stomach, small hiatal hernia, status post Maloney dilation of the esophagus   ESOPHAGOGASTRODUODENOSCOPY N/A 09/25/2012   RSW:NIOEVOJJ atonic baggy esophagus status post Maloney dilation 56 F. Hiatal hernia   GIVENS CAPSULE STUDY N/A 01/15/2013   NORMAL.    IR GENERIC HISTORICAL  03/17/2016   IR RADIOLOGIST EVAL & MGMT 03/17/2016 [facility]   LESION REMOVAL N/A 05/31/2015   Procedure: REMOVAL RIGHT AND LEFT LESIONS OF MANDIBLE;  Surgeon: Diona Browner, DDS;  Location: [facility];  Service: Oral Surgery;  Laterality: N/A;   MALONEY DILATION  12/29/2010   RMR;   NM MYOCAR PERF WALL MOTION  2006   "relavtiely normal" persantine, mild anterior thinning (breast attenuation artifact), no region of scar/ischemia   OVARIAN CYST REMOVAL     RECTOCELE REPAIR N/A 06/29/2015   Procedure: POSTERIOR REPAIR (RECTOCELE);  Surgeon: Jonnie Kind, MD;  Location: [facility];  Service: Gynecology;  Laterality: N/A;   REDUCTION MAMMAPLASTY Bilateral    SPINE SURGERY  09/29/2010   Dr. Rolena Infante   surgical excision of 3 tumors from right thigh and right buttock  and left upper thigh  2010   TOOTH EXTRACTION Bilateral 12/14/2014   Procedure: REMOVAL OF BILATERAL MANDIBULAR EXOSTOSES;  Surgeon: Diona Browner, DDS;  Location: [facility];  Service: Oral Surgery;  Laterality: Bilateral;   TRANSTHORACIC ECHOCARDIOGRAM  2010   EF 60-65%, mild conc  LVH, grade 1 diastolic dysfunction; mildly calcified MV annulus with mildly thickened leaflets, mildly calcified MR annulus    Current Medications: Current Meds  Medication Sig   Accu-Chek Softclix Lancets lancets TEST BLOOD SUGAR THREE TIMES DAILY AS DIRECTED   Alcohol Swabs (DROPSAFE ALCOHOL PREP) 70 % PADS USE TO CLEAN FINGER PRIOR TO TESTING FOR BLOOD SUGAR  AS DIRECTED   alendronate (FOSAMAX) 70 MG tablet TAKE 1 TABLET (70 MG TOTAL) BY MOUTH EVERY 7 (SEVEN) DAYS. TAKE WITH A FULL GLASS OF WATER ON AN EMPTY STOMACH.   amLODipine (NORVASC) 10 MG tablet TAKE 1 TABLET EVERY DAY (  Patient taking differently: Take 10 mg by mouth daily.)   ascorbic acid (VITAMIN C) 500 MG tablet Take 500 mg by mouth daily.   aspirin EC 81 MG tablet Take 1 tablet (81 mg total) by mouth daily with breakfast.   betamethasone dipropionate 0.05 % cream Apply topically 2 (two) times daily as needed ([place]).   Blood Glucose Calibration (ACCU-CHEK GUIDE CONTROL) LIQD USE PRN TO CALIBRATE GLUCOMETER   blood glucose meter kit and supplies Dispense based on patient and insurance preference. Use up to four times daily as directed. (FOR ICD-10 E10.9, E11.9).   buPROPion (WELLBUTRIN XL) 150 MG 24 hr tablet Take 1 tablet (150 mg total) by mouth every morning.   cetirizine (ZYRTEC) 10 MG tablet Take 1 tablet (10 mg total) by mouth daily.   clobetasol cream (TEMOVATE) 5.17 % Apply 1 application topically 2 (two) times daily.    Continuous Blood Gluc Sensor (FREESTYLE LIBRE 14 DAY SENSOR) MISC 1 each by Does not apply route every 14 (fourteen) days. Change every 2 weeks   diclofenac Sodium (VOLTAREN) 1 % GEL APPLY 2 GRAMS  TOPICALLY FOUR TIMES DAILY. (Patient taking differently: Apply 2 g topically 4 (four) times daily.)   DROPLET PEN NEEDLES 31G X 8 MM MISC USE FOR INJECTING INSULIN 4 TIMES DAILY.   ezetimibe (ZETIA) 10 MG tablet TAKE 1 TABLET EVERY DAY (Patient taking differently: Take 10 mg by mouth daily.)    Fluticasone-Umeclidin-Vilant (TRELEGY ELLIPTA) 100-62.5-25 MCG/INH AEPB Inhale 1 puff into the lungs daily.   glucose blood (ACCU-CHEK GUIDE) test strip TEST BLOOD SUGAR FOUR TIMES DAILY AND AS NEEDED   hydrOXYzine (ATARAX/VISTARIL) 10 MG tablet Take 1 tablet (10 mg total) by mouth 3 (three) times daily as needed. (Patient taking differently: Take 10 mg by mouth 3 (three) times daily as needed for itching or anxiety.)   insulin aspart (NOVOLOG FLEXPEN) 100 UNIT/ML FlexPen INJECT 16 TO 18 UNITS UNDER SKIN UP TO 3 TIMES A DAY BEFORE MEALS  (DISCARD PEN 28 DAYS AFTER OPENING) (Patient taking differently: Inject 16-18 Units into the skin 3 (three) times daily with meals. (DISCARD PEN 28 DAYS AFTER OPENING))   lamoTRIgine (LAMICTAL) 100 MG tablet Take 1 tablet (100 mg total) by mouth 2 (two) times daily.   levothyroxine (SYNTHROID) 50 MCG tablet Take 1 tablet (50 mcg) 6 days 1/2 tablet (25 mcg) 1 day weekly.   LORazepam (ATIVAN) 0.5 MG tablet Take 1 tablet (0.5 mg total) by mouth 2 (two) times daily.   losartan (COZAAR) 100 MG tablet Take 1 tablet (100 mg total) by mouth daily.   meclizine (ANTIVERT) 25 MG tablet Take 1 tablet (25 mg total) by mouth 3 (three) times daily as needed for dizziness.   methocarbamol (ROBAXIN) 500 MG tablet Take one tablet by mouth two times daily , as needed, for neck and back pain and spasm (Patient taking differently: Take 500 mg by mouth 2 (two) times daily as needed for muscle spasms (for neck and back pain and spasm).)   metoprolol tartrate (LOPRESSOR) 50 MG tablet TAKE 1 TABLET TWICE DAILY (NEED MD APPOINTMENT) (Patient taking differently: Take 50 mg by mouth 2 (two) times daily.)   montelukast (SINGULAIR) 10 MG tablet TAKE 1 TABLET EVERY DAY (Patient taking differently: Take 10 mg by mouth daily.)   nystatin (MYCOSTATIN/NYSTOP) powder APPLY TO AFFECTED AREA 4 TIMES DAILY. (Patient taking differently: Apply 1 application topically in the morning, at noon, in the evening, and  at bedtime.)   Omega-3 Fatty Acids (FISH  OIL PO) Take 1 capsule by mouth daily.   oxyCODONE-acetaminophen (PERCOCET) 10-325 MG tablet Take 1 tablet by mouth 3 (three) times daily as needed for pain.   pregabalin (LYRICA) 75 MG capsule Take 1 capsule (75 mg total) by mouth daily.   RABEprazole (ACIPHEX) 20 MG tablet TAKE 1 TABLET TWICE DAILY (MUST KEEP 07/2020 APPT FOR FURTHER REFILLS) (Patient taking differently: Take 20 mg by mouth in the morning and at bedtime.)   RESTASIS 0.05 % ophthalmic emulsion Place 2 drops into both eyes daily.   risperiDONE (RISPERDAL) 0.5 MG tablet Take 1 tablet (0.5 mg total) by mouth at bedtime.   rosuvastatin (CRESTOR) 5 MG tablet TAKE 1 TABLET AT BEDTIME (Patient taking differently: Take 5 mg by mouth daily.)   sertraline (ZOLOFT) 100 MG tablet Take 1 tablet (100 mg total) by mouth daily.   STIOLTO RESPIMAT 2.5-2.5 MCG/ACT AERS INHALE 2 PUFFS INTO THE LUNGS DAILY. (Patient taking differently: Inhale 2 puffs into the lungs daily.)   TOUJEO MAX SOLOSTAR 300 UNIT/ML Solostar Pen INJECT 24 UNITS SUBCUTANEOUSLY INTO THE SKIN AT BEDTIME (Patient taking differently: Inject 24 Units into the skin at bedtime.)   traZODone (DESYREL) 100 MG tablet Take 1 tablet (100 mg total) by mouth at bedtime.   UNABLE TO [facility] bed mattress x 1  DX: G47.33, J44.9   UNABLE TO FIND Standard wheelchair Dx M47.16, R29.898     Allergies:   Lac bovis, Phenazopyridine hcl, Cephalexin, Flonase [fluticasone], Iron, Milk-related compounds, Penicillins, and Phenazopyridine hcl   Social History   Socioeconomic History   Marital status: Divorced    Spouse name: Not on file   Number of children: 1   Years of education: 12   Highest education level: High school graduate  Occupational History   Occupation: Disabled  Tobacco Use   Smoking status: Former    Packs/day: 0.50    Years: 30.00    Pack years: 15.00    Types: Cigarettes    Quit date: 06/11/2020    Years since quitting: 0.4    Smokeless tobacco: Never   Tobacco comments:    quit 7 weeks ago-07/14/2020-[facility]   Vaping Use   Vaping Use: Never used  Substance and Sexual Activity   Alcohol use: No    Alcohol/week: 0.0 standard drinks    Comment:     Drug use: No   Sexual activity: Not Currently  Other Topics Concern   Not on file  Social History Narrative   01/29/18 Lives alone, has 3 aides, Mon- Fri 8 hrs, 2 hrs on Sat-Sun, RN manages her meds   Caffeine use: Drink coffee sometimes    Right handed    Social Determinants of Radio broadcast assistant Strain: Low Risk    Difficulty of Paying Living Expenses: Not very hard  Food Insecurity: No Food Insecurity   Worried About Charity fundraiser in the Last Year: Never true   [place] in the Last Year: Never true  Transportation Needs: No Transportation Needs   Lack of Transportation (Medical): No   Lack of Transportation (Non-Medical): No  Physical Activity: Inactive   Days of Exercise per Week: 0 days   Minutes of Exercise per Session: 0 min  Stress: Not on file  Social Connections: Moderately Isolated   Frequency of Communication with Friends and Family: Three times a week   Frequency of Social Gatherings with Friends and Family: Twice a week   Attends Religious Services: More than 4 times  per year   Active Member of Clubs or Organizations: No   Attends Archivist Meetings: Never   Marital Status: Divorced     Family History:  The patient's  family history includes Alcohol abuse in her maternal uncle; Cancer in her sister and sister; Diabetes in her brother, brother, and father; Heart attack in her maternal grandfather and mother; Hypertension in her brother and son; Kidney failure in her father; Pancreatic cancer in her sister; Pneumonia in her father; Sleep apnea in her son; Stroke in her maternal grandmother.   ROS:   Please see the history of present illness.    ROS All other systems reviewed and are negative.   PHYSICAL EXAM:    VS:  BP (!) 146/62   Pulse 70   Ht $R'4\' 11"'Re$  (1.499 m)   Wt 165 lb 9.6 oz (75.1 kg)   SpO2 96%   BMI 33.45 kg/m   Physical Exam  GEN: Obese, in no acute distress  Neck: bilateral carotid bruits,no JVD,or masses Cardiac:RRR; no murmurs, rubs, or gallops  Respiratory:  clear to auscultation bilaterally, normal work of breathing GI: soft, nontender, nondistended, + BS Ext: without cyanosis, clubbing, or edema, Good distal pulses bilaterally Neuro:  Alert and Oriented x 3 Psych: euthymic mood, full affect  Wt Readings from Last 3 Encounters:  12/06/20 165 lb 9.6 oz (75.1 kg)  11/24/20 170 lb (77.1 kg)  11/24/20 170 lb (77.1 kg)      Studies/Labs Reviewed:   EKG:  EKG is not ordered today.    Recent Labs: 12/11/2019: TSH 0.631 11/24/2020: ALT 25; BUN 24; Creatinine, Ser 1.10; Hemoglobin 11.3; Platelets 235; Potassium 4.1; Sodium 142   Lipid Panel    Component Value Date/Time   CHOL 128 09/01/2020 1444   TRIG 232 (H) 09/01/2020 1444   HDL 40 09/01/2020 1444   CHOLHDL 3.2 09/01/2020 1444   CHOLHDL 3.5 09/30/2018 1143   VLDL 26 07/19/2015 0925   LDLCALC 51 09/01/2020 1444   LDLCALC 79 09/30/2018 1143    Additional studies/ records that were reviewed today include:   Carotid dopplers 01/2020 IMPRESSION: 1. Mild (1-49%) stenosis proximal right internal carotid artery secondary to mild smooth heterogeneous atherosclerotic plaque. 2. Mild (1-49%) stenosis proximal left internal carotid artery secondary to focal bulky heterogeneous atherosclerotic plaque. 3. Vertebral arteries are patent with normal antegrade flow.   Signed,   Criselda Peaches, MD, [place]   [facility]   [facility]     Electronically Signed   By: Jacqulynn Cadet M.D.   On: 02/10/2020 15:35    LE dopplers 11/12/20 Summary:  Left:  - No evidence of deep vein thrombosis from the common femoral through the  popliteal veins.  - No evidence of  superficial venous thrombosis.  - The deep venous system is competent.  - The great and small saphenous veins are competent.    *See table(s) above for measurements and observations.   Electronically signed by Deitra Mayo MD on 11/12/2020 at 12:48:10  PM.       Final     Lower extremity venous Dopplers 02/27/2020 Summary:  RIGHT:  - No evidence of deep vein thrombosis in the lower extremity. No indirect  evidence of obstruction proximal to the inguinal ligament.  - No cystic structure found in the popliteal fossa.  - Mild to moderate superficial edema seen entire medial calf.    LEFT:  - No evidence of common femoral vein obstruction.    *  See table(s) above for measurements and observations.   Electronically signed by Ida Rogue MD on 02/27/2020 at 9:21:08 PM.    NST: 07/2017 The study is normal. This is a low risk study. The left ventricular ejection fraction is hyperdynamic (>65%). Blood pressure demonstrated a normal response to exercise. There was no ST segment deviation noted during stress.   Normal resting and stress perfusion. No ischemia or infarction EF 72%   Echocardiogram: 02/2015 Study Conclusions   - Left ventricle: The cavity size was normal. Wall thickness was   increased in a pattern of mild LVH. Systolic function was normal.   The estimated ejection fraction was in the range of 60% to 65%.   Indeterminate diastolic function. Wall motion was normal; there   were no regional wall motion abnormalities. - Aortic valve: Valve area (VTI): 2.06 cm^2. Valve area (Vmax):   2.39 cm^2. - Mitral valve: There was mild regurgitation. - Left atrium: The atrium was mildly dilated. - Atrial septum: No defect or patent foramen ovale was identified. - Pulmonary arteries: Systolic pressure was moderately increased.   PA peak pressure: 45 mm Hg (S). - Technically adequate study.          Risk Assessment/Calculations:         ASSESSMENT:    1.  Essential hypertension   2. Hyperlipidemia, unspecified hyperlipidemia type   3. Chest pain, unspecified type   4. Obstructive sleep apnea   5. Bilateral carotid bruits      PLAN:  In order of problems listed above:  Essential hypertension BP controlled   Hyperlipidemia on crestor and zetia managed by PCP   History of chest pain with low risk nuclear stress test 07/2017, normal cardiac cath 2007-has chronic chest pain, wheezing, and palpitations with housework that is unchanged. Will hold off on NST unless symptoms worsen.   OSA on CPAP    Carotid stenosis 1-49% 01/2020-she quit smoking in April.Will repeat dopplers        Shared Decision Making/Informed Consent        Medication Adjustments/Labs and Tests Ordered: Current medicines are reviewed at length with the patient today.  Concerns regarding medicines are outlined above.  Medication changes, Labs and Tests ordered today are listed in the Patient Instructions below. Patient Instructions  Medication Instructions:  Your physician recommends that you continue on your current medications as directed. Please refer to the Current Medication list given to you today.  *If you need a refill on your cardiac medications before your next appointment, please call your pharmacy*   Lab Work: None If you have labs (blood work) drawn today and your tests are completely normal, you will receive your results only by: [place] (if you have MyChart) OR A paper copy in the mail If you have any lab test that is abnormal or we need to change your treatment, we will call you to review the results.   Testing/Procedures: Your physician has requested that you have a carotid duplex. This test is an ultrasound of the carotid arteries in your neck. It looks at blood flow through these arteries that supply the brain with blood. Allow one hour for this exam. There are no restrictions or special instructions.    Follow-Up: At [facility], you and your health needs are our priority.  As part of our continuing mission to provide you with exceptional heart care, we have created designated Provider Care Teams.  These Care Teams include your primary Cardiologist (physician) and Advanced Practice  Providers (APPs -  Physician Assistants and Nurse Practitioners) who all work together to provide you with the care you need, when you need it.  We recommend signing up for the patient portal called "MyChart".  Sign up information is provided on this After Visit Summary.  MyChart is used to connect with patients for Virtual Visits (Telemedicine).  Patients are able to view lab/test results, encounter notes, upcoming appointments, etc.  Non-urgent messages can be sent to your provider as well.   To learn more about what you can do with MyChart, go to [email or web address].    Your next appointment:   6 month(s)  The format for your next appointment:   In Person  Provider:   Establish with a MD   Other Instructions     Signed, Ermalinda Barrios, PA-C  12/06/2020 2:58 PM    [address], [place], [place]  [address] Phone: [phone]; Fax: [phone]

## 2020-11-30 NOTE — Telephone Encounter (Signed)
[name] PT with [name] called wanting verbal approval to extend home health PT 2 WEEK 3 AND 1 WEEK 1 ok to leave voicemail ph# [phone]

## 2020-12-02 ENCOUNTER — Ambulatory Visit: Payer: Medicare HMO | Admitting: Internal Medicine

## 2020-12-03 DIAGNOSIS — M961 Postlaminectomy syndrome, not elsewhere classified: Secondary | ICD-10-CM | POA: Diagnosis not present

## 2020-12-03 DIAGNOSIS — Z79891 Long term (current) use of opiate analgesic: Secondary | ICD-10-CM | POA: Diagnosis not present

## 2020-12-03 DIAGNOSIS — M542 Cervicalgia: Secondary | ICD-10-CM | POA: Diagnosis not present

## 2020-12-03 DIAGNOSIS — G894 Chronic pain syndrome: Secondary | ICD-10-CM | POA: Diagnosis not present

## 2020-12-03 DIAGNOSIS — Z79899 Other long term (current) drug therapy: Secondary | ICD-10-CM | POA: Diagnosis not present

## 2020-12-03 DIAGNOSIS — M5412 Radiculopathy, cervical region: Secondary | ICD-10-CM | POA: Diagnosis not present

## 2020-12-03 NOTE — Telephone Encounter (Signed)
Verbal orders given to Stacy 

## 2020-12-06 ENCOUNTER — Telehealth: Payer: Self-pay

## 2020-12-06 ENCOUNTER — Other Ambulatory Visit: Payer: Self-pay

## 2020-12-06 ENCOUNTER — Encounter: Payer: Self-pay | Admitting: Physician Assistant

## 2020-12-06 ENCOUNTER — Ambulatory Visit (INDEPENDENT_AMBULATORY_CARE_PROVIDER_SITE_OTHER): Payer: Medicare HMO | Admitting: Physician Assistant

## 2020-12-06 VITALS — BP 146/62 | HR 70 | Ht 59.0 in | Wt 165.6 lb

## 2020-12-06 DIAGNOSIS — E785 Hyperlipidemia, unspecified: Secondary | ICD-10-CM | POA: Diagnosis not present

## 2020-12-06 DIAGNOSIS — R0989 Other specified symptoms and signs involving the circulatory and respiratory systems: Secondary | ICD-10-CM | POA: Diagnosis not present

## 2020-12-06 DIAGNOSIS — G4733 Obstructive sleep apnea (adult) (pediatric): Secondary | ICD-10-CM

## 2020-12-06 DIAGNOSIS — D649 Anemia, unspecified: Secondary | ICD-10-CM | POA: Diagnosis not present

## 2020-12-06 DIAGNOSIS — I10 Essential (primary) hypertension: Secondary | ICD-10-CM

## 2020-12-06 DIAGNOSIS — J42 Unspecified chronic bronchitis: Secondary | ICD-10-CM | POA: Diagnosis not present

## 2020-12-06 DIAGNOSIS — Z720 Tobacco use: Secondary | ICD-10-CM

## 2020-12-06 DIAGNOSIS — R2681 Unsteadiness on feet: Secondary | ICD-10-CM | POA: Diagnosis not present

## 2020-12-06 DIAGNOSIS — F17210 Nicotine dependence, cigarettes, uncomplicated: Secondary | ICD-10-CM | POA: Diagnosis not present

## 2020-12-06 DIAGNOSIS — M160 Bilateral primary osteoarthritis of hip: Secondary | ICD-10-CM | POA: Diagnosis not present

## 2020-12-06 DIAGNOSIS — R079 Chest pain, unspecified: Secondary | ICD-10-CM | POA: Diagnosis not present

## 2020-12-06 DIAGNOSIS — E1142 Type 2 diabetes mellitus with diabetic polyneuropathy: Secondary | ICD-10-CM | POA: Diagnosis not present

## 2020-12-06 DIAGNOSIS — E039 Hypothyroidism, unspecified: Secondary | ICD-10-CM | POA: Diagnosis not present

## 2020-12-06 DIAGNOSIS — J449 Chronic obstructive pulmonary disease, unspecified: Secondary | ICD-10-CM | POA: Diagnosis not present

## 2020-12-06 DIAGNOSIS — F250 Schizoaffective disorder, bipolar type: Secondary | ICD-10-CM | POA: Diagnosis not present

## 2020-12-06 NOTE — Telephone Encounter (Signed)
Appt scheduled

## 2020-12-06 NOTE — Telephone Encounter (Signed)
Patient left a voicemail. Her blood pressure has been high 184 and urinating all night, asking for a nurse to give her a call back.

## 2020-12-06 NOTE — Patient Instructions (Signed)
Medication Instructions:  Your physician recommends that you continue on your current medications as directed. Please refer to the Current Medication list given to you today.  *If you need a refill on your cardiac medications before your next appointment, please call your pharmacy*   Lab Work: None If you have labs (blood work) drawn today and your tests are completely normal, you will receive your results only by: [place] (if you have MyChart) OR A paper copy in the mail If you have any lab test that is abnormal or we need to change your treatment, we will call you to review the results.   Testing/Procedures: Your physician has requested that you have a carotid duplex. This test is an ultrasound of the carotid arteries in your neck. It looks at blood flow through these arteries that supply the brain with blood. Allow one hour for this exam. There are no restrictions or special instructions.    Follow-Up: At [facility], you and your health needs are our priority.  As part of our continuing mission to provide you with exceptional heart care, we have created designated Provider Care Teams.  These Care Teams include your primary Cardiologist (physician) and Advanced Practice Providers (APPs -  Physician Assistants and Nurse Practitioners) who all work together to provide you with the care you need, when you need it.  We recommend signing up for the patient portal called "MyChart".  Sign up information is provided on this After Visit Summary.  MyChart is used to connect with patients for Virtual Visits (Telemedicine).  Patients are able to view lab/test results, encounter notes, upcoming appointments, etc.  Non-urgent messages can be sent to your provider as well.   To learn more about what you can do with MyChart, go to [email or web address].    Your next appointment:   6 month(s)  The format for your next appointment:   In Person  Provider:   Establish with a MD   Other  Instructions

## 2020-12-07 DIAGNOSIS — D649 Anemia, unspecified: Secondary | ICD-10-CM | POA: Diagnosis not present

## 2020-12-07 DIAGNOSIS — E039 Hypothyroidism, unspecified: Secondary | ICD-10-CM | POA: Diagnosis not present

## 2020-12-07 DIAGNOSIS — E1142 Type 2 diabetes mellitus with diabetic polyneuropathy: Secondary | ICD-10-CM | POA: Diagnosis not present

## 2020-12-07 DIAGNOSIS — F250 Schizoaffective disorder, bipolar type: Secondary | ICD-10-CM | POA: Diagnosis not present

## 2020-12-07 DIAGNOSIS — R2681 Unsteadiness on feet: Secondary | ICD-10-CM | POA: Diagnosis not present

## 2020-12-07 DIAGNOSIS — M160 Bilateral primary osteoarthritis of hip: Secondary | ICD-10-CM | POA: Diagnosis not present

## 2020-12-07 DIAGNOSIS — F17210 Nicotine dependence, cigarettes, uncomplicated: Secondary | ICD-10-CM | POA: Diagnosis not present

## 2020-12-07 DIAGNOSIS — J42 Unspecified chronic bronchitis: Secondary | ICD-10-CM | POA: Diagnosis not present

## 2020-12-07 DIAGNOSIS — J449 Chronic obstructive pulmonary disease, unspecified: Secondary | ICD-10-CM | POA: Diagnosis not present

## 2020-12-08 ENCOUNTER — Other Ambulatory Visit: Payer: Self-pay

## 2020-12-08 ENCOUNTER — Encounter: Payer: Self-pay | Admitting: Family Medicine

## 2020-12-08 ENCOUNTER — Ambulatory Visit (INDEPENDENT_AMBULATORY_CARE_PROVIDER_SITE_OTHER): Payer: Medicare HMO | Admitting: Family Medicine

## 2020-12-08 VITALS — BP 146/62 | HR 63 | Resp 16 | Ht 59.0 in | Wt 168.1 lb

## 2020-12-08 DIAGNOSIS — E669 Obesity, unspecified: Secondary | ICD-10-CM | POA: Diagnosis not present

## 2020-12-08 DIAGNOSIS — M65331 Trigger finger, right middle finger: Secondary | ICD-10-CM

## 2020-12-08 DIAGNOSIS — M65321 Trigger finger, right index finger: Secondary | ICD-10-CM | POA: Diagnosis not present

## 2020-12-08 DIAGNOSIS — N39498 Other specified urinary incontinence: Secondary | ICD-10-CM

## 2020-12-08 DIAGNOSIS — I10 Essential (primary) hypertension: Secondary | ICD-10-CM | POA: Diagnosis not present

## 2020-12-08 DIAGNOSIS — R32 Unspecified urinary incontinence: Secondary | ICD-10-CM | POA: Diagnosis not present

## 2020-12-08 DIAGNOSIS — E1143 Type 2 diabetes mellitus with diabetic autonomic (poly)neuropathy: Secondary | ICD-10-CM | POA: Diagnosis not present

## 2020-12-08 DIAGNOSIS — M6530 Trigger finger, unspecified finger: Secondary | ICD-10-CM | POA: Diagnosis not present

## 2020-12-08 DIAGNOSIS — Z6833 Body mass index (BMI) 33.0-33.9, adult: Secondary | ICD-10-CM

## 2020-12-08 DIAGNOSIS — R296 Repeated falls: Secondary | ICD-10-CM

## 2020-12-08 DIAGNOSIS — F060 Psychotic disorder with hallucinations due to known physiological condition: Secondary | ICD-10-CM

## 2020-12-08 MED ORDER — SPIRONOLACTONE 25 MG PO TABS: 25.0000 mg | ORAL_TABLET | Freq: Every day | ORAL | 2 refills | Status: DC

## 2020-12-08 MED ORDER — MIRABEGRON ER 25 MG PO TB24: 25.0000 mg | ORAL_TABLET | Freq: Every day | ORAL | 2 refills | Status: DC

## 2020-12-08 NOTE — Patient Instructions (Addendum)
Keep appointment as before, call if you need me sooner  Spironolactone 25 mg one daily for blood pressure, continue other medications  Non fast chem 7 and EGR 3 days before next appointment  New once daily medication for urinary incontinence  You are referred to Dr Amedeo Plenty re right index and middle trigger fingers  [place] of f the days as they start and they end on a big calender to help you to keep up with days, they go by quickly!  Careful not to fall  Thanks for choosing [facility], we consider it a privelige to serve you.

## 2020-12-09 ENCOUNTER — Ambulatory Visit: Payer: Medicare HMO | Admitting: Internal Medicine

## 2020-12-09 DIAGNOSIS — Z79899 Other long term (current) drug therapy: Secondary | ICD-10-CM

## 2020-12-09 DIAGNOSIS — E039 Hypothyroidism, unspecified: Secondary | ICD-10-CM | POA: Diagnosis not present

## 2020-12-09 DIAGNOSIS — K589 Irritable bowel syndrome without diarrhea: Secondary | ICD-10-CM

## 2020-12-09 DIAGNOSIS — R531 Weakness: Secondary | ICD-10-CM

## 2020-12-09 DIAGNOSIS — I10 Essential (primary) hypertension: Secondary | ICD-10-CM

## 2020-12-09 DIAGNOSIS — D649 Anemia, unspecified: Secondary | ICD-10-CM | POA: Diagnosis not present

## 2020-12-09 DIAGNOSIS — E1142 Type 2 diabetes mellitus with diabetic polyneuropathy: Secondary | ICD-10-CM | POA: Diagnosis not present

## 2020-12-09 DIAGNOSIS — M160 Bilateral primary osteoarthritis of hip: Secondary | ICD-10-CM | POA: Diagnosis not present

## 2020-12-09 DIAGNOSIS — Z7982 Long term (current) use of aspirin: Secondary | ICD-10-CM

## 2020-12-09 DIAGNOSIS — F250 Schizoaffective disorder, bipolar type: Secondary | ICD-10-CM | POA: Diagnosis not present

## 2020-12-09 DIAGNOSIS — R2681 Unsteadiness on feet: Secondary | ICD-10-CM | POA: Diagnosis not present

## 2020-12-09 DIAGNOSIS — Z794 Long term (current) use of insulin: Secondary | ICD-10-CM

## 2020-12-09 DIAGNOSIS — J42 Unspecified chronic bronchitis: Secondary | ICD-10-CM | POA: Diagnosis not present

## 2020-12-09 DIAGNOSIS — K5730 Diverticulosis of large intestine without perforation or abscess without bleeding: Secondary | ICD-10-CM

## 2020-12-09 DIAGNOSIS — F17210 Nicotine dependence, cigarettes, uncomplicated: Secondary | ICD-10-CM | POA: Diagnosis not present

## 2020-12-09 DIAGNOSIS — J449 Chronic obstructive pulmonary disease, unspecified: Secondary | ICD-10-CM | POA: Diagnosis not present

## 2020-12-09 DIAGNOSIS — R296 Repeated falls: Secondary | ICD-10-CM

## 2020-12-11 ENCOUNTER — Other Ambulatory Visit: Payer: Self-pay | Admitting: Internal Medicine

## 2020-12-11 DIAGNOSIS — I10 Essential (primary) hypertension: Secondary | ICD-10-CM | POA: Diagnosis not present

## 2020-12-11 DIAGNOSIS — J449 Chronic obstructive pulmonary disease, unspecified: Secondary | ICD-10-CM | POA: Diagnosis not present

## 2020-12-12 ENCOUNTER — Encounter: Payer: Self-pay | Admitting: Family Medicine

## 2020-12-12 DIAGNOSIS — M6530 Trigger finger, unspecified finger: Secondary | ICD-10-CM | POA: Insufficient documentation

## 2020-12-12 NOTE — Assessment & Plan Note (Signed)
Uncontrolled , add spironolactone DASH diet and commitment to daily physical activity for a minimum of 30 minutes discussed and encouraged, as a part of hypertension management. The importance of attaining a healthy weight is also discussed.  BP/Weight 12/08/2020 12/06/2020 11/24/2020 11/24/2020 11/18/2020 05/13/3555 05/13/2023  Systolic BP 427 062 376 283 151 761 607  Diastolic BP 62 62 56 67 71 60 80  Wt. (Lbs) 168.12 165.6 170 170 169.4 168.2 169  BMI 33.96 33.45 34.34 34.34 34.21 33.97 33.01  Some encounter information is confidential and restricted. Go to Review Flowsheets activity to see all data.

## 2020-12-12 NOTE — Assessment & Plan Note (Signed)
Managed by endo [name] is reminded of the importance of commitment to daily physical activity for 30 minutes or more, as able and the need to limit carbohydrate intake to 30 to 60 grams per meal to help with blood sugar control.   The need to take medication as prescribed, test blood sugar as directed, and to call between visits if there is a concern that blood sugar is uncontrolled is also discussed.   [name] is reminded of the importance of daily foot exam, annual eye examination, and good blood sugar, blood pressure and cholesterol control.  Diabetic Labs Latest Ref Rng & Units 11/24/2020 11/18/2020 09/01/2020 07/29/2020 05/04/2020  HbA1c 4.0 - 5.6 % - - - 5.8(A) -  Microalbumin Not Estab. ug/mL - - - - -  Micro/Creat Ratio 0 - 29 mg/g creat - - - - -  Chol 100 - 199 mg/dL - - 128 - -  HDL >39 mg/dL - - 40 - -  Calc LDL 0 - 99 mg/dL - - 51 - -  Triglycerides 0 - 149 mg/dL - - 232(H) - -  Creatinine 0.44 - 1.00 mg/dL 1.10(H) 0.98 1.05(H) - 1.13(H)  GFR >60.00 mL/min - - - - -   BP/Weight 12/08/2020 12/06/2020 11/24/2020 11/24/2020 11/18/2020 07/11/6999 09/14/9447  Systolic BP 675 916 384 665 993 570 177  Diastolic BP 62 62 56 67 71 60 80  Wt. (Lbs) 168.12 165.6 170 170 169.4 168.2 169  BMI 33.96 33.45 34.34 34.34 34.21 33.97 33.01  Some encounter information is confidential and restricted. Go to Review Flowsheets activity to see all data.   Foot/eye exam completion dates Latest Ref Rng & Units 05/26/2020 01/01/2019  Eye Exam No Retinopathy No Retinopathy -  Foot exam Order - - -  Foot Form Completion - - Done

## 2020-12-12 NOTE — Assessment & Plan Note (Signed)
Trigger right index and middle, painful refer Ortho

## 2020-12-12 NOTE — Assessment & Plan Note (Signed)
Start medication, practice keiegel exercises and void regularly when awake

## 2020-12-12 NOTE — Progress Notes (Signed)
[name]     MRN: [id number]      DOB: [birth year]   HPI [name] is here for follow up and re-evaluation of chronic medical conditions, medication management and review of any available recent lab and radiology data.  Preventive health is updated, specifically  Cancer screening and Immunization.   Questions or concerns regarding consultations or procedures which the PT has had in the interim are  addressed. The PT denies any adverse reactions to current medications since the last visit.  C/o forgetting and getting confused with days C/o incontinence C/o uncontrolled BP C/o trigger fingers ROS Denies recent fever or chills. Denies sinus pressure, nasal congestion, ear pain or sore throat. Denies chest congestion, productive cough or wheezing. Denies chest pains, palpitations and leg swelling Denies abdominal pain, nausea, vomiting,diarrhea or constipation.   Denies dysuria, frequency, hesitancy . c/o right index and middle trigger fingers Denies headaches, seizures, numbness, or tingling. Denies uncontrolled depression, anxiety or insomnia. Denies skin break down or rash.   PE  BP (!) 146/62   Pulse 63   Resp 16   Ht 4\' 11"  (1.499 m)   Wt 168 lb 1.9 oz (76.3 kg)   SpO2 94%   BMI 33.96 kg/m   Patient alert and oriented and in no cardiopulmonary distress.  HEENT: No facial asymmetry, EOMI,     Neck supple .  Chest: Clear to auscultation bilaterally.  CVS: S1, S2 no murmurs, no S3.Regular rate.  ABD: Soft non tender.   Ext: No edema  MS: decreasedOM spine, shoulders, hips and knees.right index and middle fingers trigger  Skin: Intact, no ulcerations or rash noted.  Psych: Good eye contact, normal affect. Memory intact not anxious or depressed appearing.  CNS: CN 2-12 intact, power,  normal throughout.no focal deficits noted.   Assessment & Plan  Essential hypertension Uncontrolled , add spironolactone DASH diet and commitment to daily physical activity  for a minimum of 30 minutes discussed and encouraged, as a part of hypertension management. The importance of attaining a healthy weight is also discussed.  BP/Weight 12/08/2020 12/06/2020 11/24/2020 11/24/2020 11/18/2020 08/13/2295 11/18/9209  Systolic BP 941 740 814 481 856 314 970  Diastolic BP 62 62 56 67 71 60 80  Wt. (Lbs) 168.12 165.6 170 170 169.4 168.2 169  BMI 33.96 33.45 34.34 34.34 34.21 33.97 33.01  Some encounter information is confidential and restricted. Go to Review Flowsheets activity to see all data.       Urinary incontinence Start medication, practice keiegel exercises and void regularly when awake  Trigger finger, right Trigger right index and middle, painful refer Ortho  Recurrent falls Home and personal safety reviewed, no falls since last visit  Obesity (BMI 30.0-34.9)  Patient re-educated about  the importance of commitment to a  minimum of 150 minutes of exercise per week as able.  The importance of healthy food choices with portion control discussed, as well as eating regularly and within a 12 hour window most days. The need to choose "clean , green" food 50 to 75% of the time is discussed, as well as to make water the primary drink and set a goal of 64 ounces water daily.    Weight /BMI 12/08/2020 12/06/2020 11/24/2020  WEIGHT 168 lb 1.9 oz 165 lb 9.6 oz 170 lb  HEIGHT 4\' 11"  4\' 11"  4\' 11"   BMI 33.96 kg/m2 33.45 kg/m2 34.34 kg/m2  Some encounter information is confidential and restricted. Go to Review Flowsheets activity to see all  data.      Well controlled type 2 diabetes mellitus with gastroparesis ([place]) Managed by endo [name] is reminded of the importance of commitment to daily physical activity for 30 minutes or more, as able and the need to limit carbohydrate intake to 30 to 60 grams per meal to help with blood sugar control.   The need to take medication as prescribed, test blood sugar as directed, and to call between visits if there is a concern  that blood sugar is uncontrolled is also discussed.   [name] is reminded of the importance of daily foot exam, annual eye examination, and good blood sugar, blood pressure and cholesterol control.  Diabetic Labs Latest Ref Rng & Units 11/24/2020 11/18/2020 09/01/2020 07/29/2020 05/04/2020  HbA1c 4.0 - 5.6 % - - - 5.8(A) -  Microalbumin Not Estab. ug/mL - - - - -  Micro/Creat Ratio 0 - 29 mg/g creat - - - - -  Chol 100 - 199 mg/dL - - 128 - -  HDL >39 mg/dL - - 40 - -  Calc LDL 0 - 99 mg/dL - - 51 - -  Triglycerides 0 - 149 mg/dL - - 232(H) - -  Creatinine 0.44 - 1.00 mg/dL 1.10(H) 0.98 1.05(H) - 1.13(H)  GFR >60.00 mL/min - - - - -   BP/Weight 12/08/2020 12/06/2020 11/24/2020 11/24/2020 11/18/2020 04/19/8339 11/17/2227  Systolic BP 798 921 194 174 081 448 185  Diastolic BP 62 62 56 67 71 60 80  Wt. (Lbs) 168.12 165.6 170 170 169.4 168.2 169  BMI 33.96 33.45 34.34 34.34 34.21 33.97 33.01  Some encounter information is confidential and restricted. Go to Review Flowsheets activity to see all data.   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

## 2020-12-12 NOTE — Assessment & Plan Note (Signed)
Stable and managed by Psych 

## 2020-12-12 NOTE — Assessment & Plan Note (Signed)
Home and personal safety reviewed, no falls since last visit

## 2020-12-12 NOTE — Assessment & Plan Note (Signed)
  Patient re-educated about  the importance of commitment to a  minimum of 150 minutes of exercise per week as able.  The importance of healthy food choices with portion control discussed, as well as eating regularly and within a 12 hour window most days. The need to choose "clean , green" food 50 to 75% of the time is discussed, as well as to make water the primary drink and set a goal of 64 ounces water daily.    Weight /BMI 12/08/2020 12/06/2020 11/24/2020  WEIGHT 168 lb 1.9 oz 165 lb 9.6 oz 170 lb  HEIGHT 4\' 11"  4\' 11"  4\' 11"   BMI 33.96 kg/m2 33.45 kg/m2 34.34 kg/m2  Some encounter information is confidential and restricted. Go to Review Flowsheets activity to see all data.

## 2020-12-13 ENCOUNTER — Ambulatory Visit (INDEPENDENT_AMBULATORY_CARE_PROVIDER_SITE_OTHER): Payer: Medicare HMO | Admitting: Psychiatry

## 2020-12-13 ENCOUNTER — Other Ambulatory Visit: Payer: Self-pay

## 2020-12-13 ENCOUNTER — Ambulatory Visit (HOSPITAL_COMMUNITY): Payer: Medicare HMO

## 2020-12-13 DIAGNOSIS — F331 Major depressive disorder, recurrent, moderate: Secondary | ICD-10-CM

## 2020-12-13 NOTE — Progress Notes (Signed)
Virtual Visit via Telephone Note  I connected with Kristell Wooding Corcino on 12/13/20 at 4:11 PM EDT by telephone and verified that I am speaking with the correct person using two identifiers.  Location: Patient: Home Provider: [place] office    I discussed the limitations, risks, security and privacy concerns of performing an evaluation and management service by telephone and the availability of in person appointments. I also discussed with the patient that there may be a patient responsible charge related to this service. The patient expressed understanding and agreed to proceed.   I provided  44 minutes of non-face-to-face time during this encounter.   [name], LCSW              THERAPIST PROGRESS NOTE    Session Time: Monday 12/13/2020 4:11 pm - 4:55 PM  Participation Level: Active  Behavioral Response:  Less depressed, talkative, alert   Type of Therapy: Individual Therapy  Treatment Goals addressed: Patient wants to learn how to improve coping skills to manage chronic pain and health issues/ improve mood    Interventions: CBT and Supportive             Summary: Snow Peoples Takacs is a 69 y.o. female who presents with  long standing history of recurrent periods  of depression beginning when she was [age] and her favorite uncle died. Patient reports multiple psychiatric hospitlaizations due to depression and suicidal ideations with the last one occuring in 1997. Patient has participated in outpatient psychotherapy and medication management intermittently since age 51.  She currently is seeing psychiatrist Dr. Harrington Challenger . Prior to this, she was seen at [facility]. Patient also has had ECT at [facility]. Symptoms have worsened in recent months due to family stress and have  included depressed mood, anxiety, excessive worry, and tearfulness.         Patient's last contact was by virtual visit via telephone about 2-3  weeks ago.  She reports continued symptoms of depression as  well as continued efforts to use helpful coping strategies.  Pain still remains the major trigger of depressive symptoms and negative thoughts.  Patient reports increased awareness and using distracting activities to try to cope.  She continues to attend church and reports she has started reading.  She also reports having daily telephone conversations with a friend.  She continues to express frustration about not having more help when riding the [place] but reports reluctance to use assertiveness skills to express her concerns.  She fears negative feedback from her brother who is one of the [place] drivers.  Patient reports finding previously mailed materials on mindfulness and reports trying to use grounding techniques.  She continues to express frustration about continued pain especially concerns about residual effects from concussion.  She is working with her medical providers regarding this.   Suicidal/Homicidal: Nowithout intent/plan      Therapist Response: Reviewed symptoms, praised and reinforced patient's efforts to maintain involvement in activities, patient reinforced patient's increased awareness of her thoughts and using distracting activities, discussed effects on her mood, continued to discuss mindfulness and benefits of using mindfulness, began to explain the window of tolerance  Diagnosis: Axis I: MDD, Recurrent, moderate    Axis II: Deferred    Audyn Dimercurio E Demarcus Thielke, LCSW

## 2020-12-14 ENCOUNTER — Other Ambulatory Visit: Payer: Self-pay

## 2020-12-14 ENCOUNTER — Ambulatory Visit
Admission: RE | Admit: 2020-12-14 | Discharge: 2020-12-14 | Disposition: A | Discharge status: Home / Self Care | Payer: Medicare HMO | Source: Ambulatory Visit | Attending: Family Medicine | Admitting: Family Medicine

## 2020-12-14 DIAGNOSIS — M160 Bilateral primary osteoarthritis of hip: Secondary | ICD-10-CM | POA: Diagnosis not present

## 2020-12-14 DIAGNOSIS — J42 Unspecified chronic bronchitis: Secondary | ICD-10-CM | POA: Diagnosis not present

## 2020-12-14 DIAGNOSIS — F250 Schizoaffective disorder, bipolar type: Secondary | ICD-10-CM | POA: Diagnosis not present

## 2020-12-14 DIAGNOSIS — E1142 Type 2 diabetes mellitus with diabetic polyneuropathy: Secondary | ICD-10-CM | POA: Diagnosis not present

## 2020-12-14 DIAGNOSIS — Z1231 Encounter for screening mammogram for malignant neoplasm of breast: Secondary | ICD-10-CM | POA: Diagnosis not present

## 2020-12-14 DIAGNOSIS — F17210 Nicotine dependence, cigarettes, uncomplicated: Secondary | ICD-10-CM | POA: Diagnosis not present

## 2020-12-14 DIAGNOSIS — D649 Anemia, unspecified: Secondary | ICD-10-CM | POA: Diagnosis not present

## 2020-12-14 DIAGNOSIS — R2681 Unsteadiness on feet: Secondary | ICD-10-CM | POA: Diagnosis not present

## 2020-12-14 DIAGNOSIS — J449 Chronic obstructive pulmonary disease, unspecified: Secondary | ICD-10-CM | POA: Diagnosis not present

## 2020-12-14 DIAGNOSIS — E039 Hypothyroidism, unspecified: Secondary | ICD-10-CM | POA: Diagnosis not present

## 2020-12-15 ENCOUNTER — Ambulatory Visit: Payer: Medicare HMO | Admitting: Family Medicine

## 2020-12-16 DIAGNOSIS — D649 Anemia, unspecified: Secondary | ICD-10-CM | POA: Diagnosis not present

## 2020-12-16 DIAGNOSIS — M160 Bilateral primary osteoarthritis of hip: Secondary | ICD-10-CM | POA: Diagnosis not present

## 2020-12-16 DIAGNOSIS — R2681 Unsteadiness on feet: Secondary | ICD-10-CM | POA: Diagnosis not present

## 2020-12-16 DIAGNOSIS — F250 Schizoaffective disorder, bipolar type: Secondary | ICD-10-CM | POA: Diagnosis not present

## 2020-12-16 DIAGNOSIS — F17210 Nicotine dependence, cigarettes, uncomplicated: Secondary | ICD-10-CM | POA: Diagnosis not present

## 2020-12-16 DIAGNOSIS — J42 Unspecified chronic bronchitis: Secondary | ICD-10-CM | POA: Diagnosis not present

## 2020-12-16 DIAGNOSIS — E1142 Type 2 diabetes mellitus with diabetic polyneuropathy: Secondary | ICD-10-CM | POA: Diagnosis not present

## 2020-12-16 DIAGNOSIS — E039 Hypothyroidism, unspecified: Secondary | ICD-10-CM | POA: Diagnosis not present

## 2020-12-16 DIAGNOSIS — J449 Chronic obstructive pulmonary disease, unspecified: Secondary | ICD-10-CM | POA: Diagnosis not present

## 2020-12-17 ENCOUNTER — Other Ambulatory Visit: Payer: Self-pay

## 2020-12-17 ENCOUNTER — Encounter: Payer: Self-pay | Admitting: Family Medicine

## 2020-12-17 ENCOUNTER — Ambulatory Visit (INDEPENDENT_AMBULATORY_CARE_PROVIDER_SITE_OTHER): Payer: Medicare HMO | Admitting: Family Medicine

## 2020-12-17 VITALS — BP 165/70 | HR 70 | Resp 16 | Ht 59.0 in | Wt 168.8 lb

## 2020-12-17 DIAGNOSIS — S0990XA Unspecified injury of head, initial encounter: Secondary | ICD-10-CM | POA: Diagnosis not present

## 2020-12-17 DIAGNOSIS — G4733 Obstructive sleep apnea (adult) (pediatric): Secondary | ICD-10-CM | POA: Diagnosis not present

## 2020-12-17 DIAGNOSIS — R519 Headache, unspecified: Secondary | ICD-10-CM

## 2020-12-17 DIAGNOSIS — G894 Chronic pain syndrome: Secondary | ICD-10-CM

## 2020-12-17 DIAGNOSIS — I10 Essential (primary) hypertension: Secondary | ICD-10-CM

## 2020-12-17 MED ORDER — KETOROLAC TROMETHAMINE 60 MG/2ML IM SOLN
30.0000 mg | Freq: Once | INTRAMUSCULAR | Status: AC
  Administered 2020-12-17: 30 mg via INTRAMUSCULAR

## 2020-12-17 NOTE — Patient Instructions (Addendum)
F/U as before, call if you need me sooner  Toradol 30 mg IM in office for headache, and use tylenol 500 mg twice daily for next 2 to 4 days  Blood pressure is elevated , very important to take medication every day at same time  No skin breakdown or change in swelling noted in area of trauma.  No need for imaging at this time  Please protect our head , no more trauma!!  We will prescribe hospital bed and firm mattress based on your report that current bed is broken

## 2020-12-17 NOTE — Progress Notes (Signed)
   [name]     MRN: [id number]      DOB: [birth year]   HPI [name] is here TRAUMA TO HEAD THIS AM AROUND 4 , HIT HEAD IN SHOWER, SAME AREA IS TENDER, NEVER GOT BETTER, denies bleeding, or any loss of conciousness  WANTS / NEEDS NEW HOSPITAL BED AND FIRM MATTRESS  [name] suffers from chronic generalized pain which is caused by generalized osteoarthritis and disc disease. Hospital bed will alleviate pain by allowing spine, hips, knees and shoulders to be positioned in ways not feasible with a normal bed.Pain episodes occur frequently , at times awakening her and require frequent and immediate changes in  body position which cannot be achieved with a normal bed. She requires a firm mattress for the bed also. [name] also has OSA, and has trouble breathing at night, she is at risk of aspiration, while in bed , as she takes many medications which are sedating . She spends 50% of her time in bed, and problems with both aspiration and dysphagia cause frequent gagging/ choking. Torso needs to be elevated at least 30 degrees or more to prevent aspiration. Bed wedges do not provide adequate elevation to resolve issues with aspiration, immediate changes need to be made to prevent gagging/ choking which cannot be achieved with a normal bed  ROS Denies recent fever or chills. Denies sinus pressure, nasal congestion, ear pain or sore throat. Denies chest congestion, productive cough or wheezing. Denies chest pains, palpitations and leg swelling Denies abdominal pain, nausea, vomiting,diarrhea or constipation.   Denies dysuria, frequency, hesitancy or incontinence. Chronic  joint pain,  and limitation in mobility. Denies seizures, Denies uncontrolled  depression, anxiety or insomnia. Denies skin break down or rash.   PE  BP (!) 165/70   Pulse 70   Resp 16   Ht 4\' 11"  (1.499 m)   Wt 168 lb 12.8 oz (76.6 kg)   SpO2 91%   BMI 34.09 kg/m   Patient alert and oriented and in no cardiopulmonary  distress.  HEENT: No facial asymmetry, EOMI,     Neck decreased though adequate ROM, tender swelling in left occipital area, no skin breakdown/ laceration .  Chest: Clear to auscultation bilaterally.  CVS: S1, S2 no murmurs, no S3.Regular rate.  ABD: Soft non tender.   Ext: No edema  MS: decreased  ROM spine, shoulders, hips and knees.  Skin: Intact, no ulcerations or rash noted.  Psych: Good eye contact, normal affect. Memory intact not anxious or depressed appearing.  CNS: CN 2-12 intact, power,  normal throughout.no focal deficits noted.   Assessment & Plan  Head trauma, initial encounter Recurrent head trauma in bathroom in shower , second incident over an approximate 2 month period, home and bath safety an issue, needs to be accompanied / assisted with bathing. No localized neurologic signs, observation at this time Toradol 60 mg Im in the office  Chronic pain syndrome Generalized chronic pain due to artheitis and disc disease affecting spine, pper and lower extremities, needs new hospital bed and mattress  Obstructive sleep apnea Increased risk of gagging and choking and aspiration, also head needs to be elevated above 30 degrees, needs hospital bed and mattress  Essential hypertension Elevated at visit, will reassess at next visit, no med changes at this one  Headache Acute headache pot trauma, toradol 30 mg IM, and tylenol for pain as needed

## 2020-12-18 ENCOUNTER — Other Ambulatory Visit: Payer: Self-pay | Admitting: Internal Medicine

## 2020-12-20 ENCOUNTER — Encounter: Payer: Self-pay | Admitting: Family Medicine

## 2020-12-20 ENCOUNTER — Other Ambulatory Visit: Payer: Self-pay

## 2020-12-20 DIAGNOSIS — E039 Hypothyroidism, unspecified: Secondary | ICD-10-CM | POA: Diagnosis not present

## 2020-12-20 DIAGNOSIS — J42 Unspecified chronic bronchitis: Secondary | ICD-10-CM | POA: Diagnosis not present

## 2020-12-20 DIAGNOSIS — E1142 Type 2 diabetes mellitus with diabetic polyneuropathy: Secondary | ICD-10-CM | POA: Diagnosis not present

## 2020-12-20 DIAGNOSIS — F17210 Nicotine dependence, cigarettes, uncomplicated: Secondary | ICD-10-CM | POA: Diagnosis not present

## 2020-12-20 DIAGNOSIS — M160 Bilateral primary osteoarthritis of hip: Secondary | ICD-10-CM | POA: Diagnosis not present

## 2020-12-20 DIAGNOSIS — S0990XA Unspecified injury of head, initial encounter: Secondary | ICD-10-CM | POA: Insufficient documentation

## 2020-12-20 DIAGNOSIS — D649 Anemia, unspecified: Secondary | ICD-10-CM | POA: Diagnosis not present

## 2020-12-20 DIAGNOSIS — F250 Schizoaffective disorder, bipolar type: Secondary | ICD-10-CM | POA: Diagnosis not present

## 2020-12-20 DIAGNOSIS — J449 Chronic obstructive pulmonary disease, unspecified: Secondary | ICD-10-CM | POA: Diagnosis not present

## 2020-12-20 DIAGNOSIS — R2681 Unsteadiness on feet: Secondary | ICD-10-CM | POA: Diagnosis not present

## 2020-12-20 MED ORDER — UNABLE TO FIND: 0 refills | Status: DC

## 2020-12-20 NOTE — Assessment & Plan Note (Addendum)
Recurrent head trauma in bathroom in shower , second incident over an approximate 2 month period, home and bath safety an issue, needs to be accompanied / assisted with bathing. No localized neurologic signs, observation at this time Toradol 60 mg Im in the office

## 2020-12-20 NOTE — Assessment & Plan Note (Signed)
Acute headache pot trauma, toradol 30 mg IM, and tylenol for pain as needed

## 2020-12-20 NOTE — Assessment & Plan Note (Signed)
Generalized chronic pain due to artheitis and disc disease affecting spine, pper and lower extremities, needs new hospital bed and mattress

## 2020-12-20 NOTE — Assessment & Plan Note (Signed)
Elevated at visit, will reassess at next visit, no med changes at this one

## 2020-12-20 NOTE — Assessment & Plan Note (Signed)
Increased risk of gagging and choking and aspiration, also head needs to be elevated above 30 degrees, needs hospital bed and mattress

## 2020-12-21 ENCOUNTER — Telehealth: Payer: Self-pay | Admitting: Dietician

## 2020-12-21 DIAGNOSIS — D649 Anemia, unspecified: Secondary | ICD-10-CM | POA: Diagnosis not present

## 2020-12-21 DIAGNOSIS — E1142 Type 2 diabetes mellitus with diabetic polyneuropathy: Secondary | ICD-10-CM | POA: Diagnosis not present

## 2020-12-21 DIAGNOSIS — J42 Unspecified chronic bronchitis: Secondary | ICD-10-CM | POA: Diagnosis not present

## 2020-12-21 DIAGNOSIS — J449 Chronic obstructive pulmonary disease, unspecified: Secondary | ICD-10-CM | POA: Diagnosis not present

## 2020-12-21 DIAGNOSIS — F17210 Nicotine dependence, cigarettes, uncomplicated: Secondary | ICD-10-CM | POA: Diagnosis not present

## 2020-12-21 DIAGNOSIS — F250 Schizoaffective disorder, bipolar type: Secondary | ICD-10-CM | POA: Diagnosis not present

## 2020-12-21 DIAGNOSIS — R2681 Unsteadiness on feet: Secondary | ICD-10-CM | POA: Diagnosis not present

## 2020-12-21 DIAGNOSIS — E039 Hypothyroidism, unspecified: Secondary | ICD-10-CM | POA: Diagnosis not present

## 2020-12-21 DIAGNOSIS — M160 Bilateral primary osteoarthritis of hip: Secondary | ICD-10-CM | POA: Diagnosis not present

## 2020-12-21 NOTE — Telephone Encounter (Signed)
Patient called and states that she had missed an earlier appointment with [name] and is scheduled at her first available on November 7.  She asks if she should get an earlier appointment.  She denies issues and states that she thinks she has current prescriptions.  Antonieta Iba, RD, LDN, CDCES

## 2020-12-22 DIAGNOSIS — F17210 Nicotine dependence, cigarettes, uncomplicated: Secondary | ICD-10-CM | POA: Diagnosis not present

## 2020-12-22 DIAGNOSIS — J42 Unspecified chronic bronchitis: Secondary | ICD-10-CM | POA: Diagnosis not present

## 2020-12-22 DIAGNOSIS — J449 Chronic obstructive pulmonary disease, unspecified: Secondary | ICD-10-CM | POA: Diagnosis not present

## 2020-12-22 DIAGNOSIS — E1142 Type 2 diabetes mellitus with diabetic polyneuropathy: Secondary | ICD-10-CM | POA: Diagnosis not present

## 2020-12-22 DIAGNOSIS — R2681 Unsteadiness on feet: Secondary | ICD-10-CM | POA: Diagnosis not present

## 2020-12-22 DIAGNOSIS — E039 Hypothyroidism, unspecified: Secondary | ICD-10-CM | POA: Diagnosis not present

## 2020-12-22 DIAGNOSIS — M160 Bilateral primary osteoarthritis of hip: Secondary | ICD-10-CM | POA: Diagnosis not present

## 2020-12-22 DIAGNOSIS — D649 Anemia, unspecified: Secondary | ICD-10-CM | POA: Diagnosis not present

## 2020-12-22 DIAGNOSIS — F250 Schizoaffective disorder, bipolar type: Secondary | ICD-10-CM | POA: Diagnosis not present

## 2020-12-23 ENCOUNTER — Ambulatory Visit (INDEPENDENT_AMBULATORY_CARE_PROVIDER_SITE_OTHER): Payer: Medicare HMO | Admitting: Family Medicine

## 2020-12-23 ENCOUNTER — Other Ambulatory Visit: Payer: Self-pay

## 2020-12-23 ENCOUNTER — Inpatient Hospital Stay (HOSPITAL_COMMUNITY): Payer: Medicare HMO | Attending: Hematology

## 2020-12-23 DIAGNOSIS — E785 Hyperlipidemia, unspecified: Secondary | ICD-10-CM | POA: Diagnosis not present

## 2020-12-23 DIAGNOSIS — R5383 Other fatigue: Secondary | ICD-10-CM | POA: Diagnosis not present

## 2020-12-23 DIAGNOSIS — N183 Chronic kidney disease, stage 3 unspecified: Secondary | ICD-10-CM | POA: Insufficient documentation

## 2020-12-23 DIAGNOSIS — K5900 Constipation, unspecified: Secondary | ICD-10-CM | POA: Diagnosis not present

## 2020-12-23 DIAGNOSIS — J449 Chronic obstructive pulmonary disease, unspecified: Secondary | ICD-10-CM | POA: Diagnosis not present

## 2020-12-23 DIAGNOSIS — K449 Diaphragmatic hernia without obstruction or gangrene: Secondary | ICD-10-CM | POA: Insufficient documentation

## 2020-12-23 DIAGNOSIS — D509 Iron deficiency anemia, unspecified: Secondary | ICD-10-CM | POA: Diagnosis not present

## 2020-12-23 DIAGNOSIS — M810 Age-related osteoporosis without current pathological fracture: Secondary | ICD-10-CM | POA: Diagnosis not present

## 2020-12-23 DIAGNOSIS — E119 Type 2 diabetes mellitus without complications: Secondary | ICD-10-CM | POA: Diagnosis not present

## 2020-12-23 DIAGNOSIS — Z7189 Other specified counseling: Secondary | ICD-10-CM | POA: Insufficient documentation

## 2020-12-23 DIAGNOSIS — E039 Hypothyroidism, unspecified: Secondary | ICD-10-CM | POA: Insufficient documentation

## 2020-12-23 DIAGNOSIS — I129 Hypertensive chronic kidney disease with stage 1 through stage 4 chronic kidney disease, or unspecified chronic kidney disease: Secondary | ICD-10-CM | POA: Insufficient documentation

## 2020-12-23 DIAGNOSIS — Z79899 Other long term (current) drug therapy: Secondary | ICD-10-CM | POA: Insufficient documentation

## 2020-12-23 DIAGNOSIS — I10 Essential (primary) hypertension: Secondary | ICD-10-CM | POA: Insufficient documentation

## 2020-12-23 DIAGNOSIS — Z86010 Personal history of colonic polyps: Secondary | ICD-10-CM | POA: Diagnosis not present

## 2020-12-23 DIAGNOSIS — D500 Iron deficiency anemia secondary to blood loss (chronic): Secondary | ICD-10-CM

## 2020-12-23 DIAGNOSIS — M797 Fibromyalgia: Secondary | ICD-10-CM | POA: Insufficient documentation

## 2020-12-23 LAB — CBC WITH DIFFERENTIAL/PLATELET
Abs Immature Granulocytes: 0.06 10*3/uL (ref 0.00–0.07)
Basophils Absolute: 0 10*3/uL (ref 0.0–0.1)
Basophils Relative: 1 %
Eosinophils Absolute: 0.2 10*3/uL (ref 0.0–0.5)
Eosinophils Relative: 4 %
HCT: 36.5 % (ref 36.0–46.0)
Hemoglobin: 10.5 g/dL — ABNORMAL LOW (ref 12.0–15.0)
Immature Granulocytes: 1 %
Lymphocytes Relative: 29 %
Lymphs Abs: 1.7 10*3/uL (ref 0.7–4.0)
MCH: 22.9 pg — ABNORMAL LOW (ref 26.0–34.0)
MCHC: 28.8 g/dL — ABNORMAL LOW (ref 30.0–36.0)
MCV: 79.7 fL — ABNORMAL LOW (ref 80.0–100.0)
Monocytes Absolute: 0.5 10*3/uL (ref 0.1–1.0)
Monocytes Relative: 9 %
Neutro Abs: 3.2 10*3/uL (ref 1.7–7.7)
Neutrophils Relative %: 56 %
Platelets: 254 10*3/uL (ref 150–400)
RBC: 4.58 MIL/uL (ref 3.87–5.11)
RDW: 14.1 % (ref 11.5–15.5)
WBC: 5.6 10*3/uL (ref 4.0–10.5)
nRBC: 0 % (ref 0.0–0.2)

## 2020-12-23 LAB — IRON AND TIBC
Iron: 71 ug/dL (ref 28–170)
Saturation Ratios: 22 % (ref 10.4–31.8)
TIBC: 319 ug/dL (ref 250–450)
UIBC: 248 ug/dL

## 2020-12-23 LAB — COMPREHENSIVE METABOLIC PANEL
ALT: 24 U/L (ref 0–44)
AST: 20 U/L (ref 15–41)
Albumin: 3.8 g/dL (ref 3.5–5.0)
Alkaline Phosphatase: 78 U/L (ref 38–126)
Anion gap: 5 (ref 5–15)
BUN: 29 mg/dL — ABNORMAL HIGH (ref 8–23)
CO2: 25 mmol/L (ref 22–32)
Calcium: 9.3 mg/dL (ref 8.9–10.3)
Chloride: 112 mmol/L — ABNORMAL HIGH (ref 98–111)
Creatinine, Ser: 1.23 mg/dL — ABNORMAL HIGH (ref 0.44–1.00)
GFR, Estimated: 47 mL/min — ABNORMAL LOW (ref >60)
Glucose, Bld: 110 mg/dL — ABNORMAL HIGH (ref 70–99)
Potassium: 4.4 mmol/L (ref 3.5–5.1)
Sodium: 142 mmol/L (ref 135–145)
Total Bilirubin: 0.6 mg/dL (ref 0.3–1.2)
Total Protein: 7.1 g/dL (ref 6.5–8.1)

## 2020-12-23 LAB — FERRITIN: Ferritin: 374 ng/mL — ABNORMAL HIGH (ref 11–307)

## 2020-12-23 NOTE — Patient Instructions (Signed)
Keep November appointment as before  MOST form will be given to ypou today, the original form which I have signed and which you will also sign  The additional papers you have been given need to be reviewed with your family  Thanks for choosing [facility], we consider it a privelige to serve you.

## 2020-12-23 NOTE — Assessment & Plan Note (Addendum)
Most form completed, pt given original and copy kept for record25 minutes spent in face to face time with patient

## 2020-12-24 DIAGNOSIS — M65331 Trigger finger, right middle finger: Secondary | ICD-10-CM | POA: Diagnosis not present

## 2020-12-26 DIAGNOSIS — M4716 Other spondylosis with myelopathy, lumbar region: Secondary | ICD-10-CM | POA: Diagnosis not present

## 2020-12-26 DIAGNOSIS — R29898 Other symptoms and signs involving the musculoskeletal system: Secondary | ICD-10-CM | POA: Diagnosis not present

## 2020-12-27 DIAGNOSIS — G894 Chronic pain syndrome: Secondary | ICD-10-CM | POA: Diagnosis not present

## 2020-12-27 DIAGNOSIS — G4733 Obstructive sleep apnea (adult) (pediatric): Secondary | ICD-10-CM | POA: Diagnosis not present

## 2020-12-28 ENCOUNTER — Encounter: Payer: Self-pay | Admitting: Family Medicine

## 2020-12-28 NOTE — Progress Notes (Signed)
   [name]     MRN: [id number]      DOB: 01-12-1951   HPI [name] is here for advanced care planning MOST form is reviewed with patient, and  all questions / concerns are addressed, she has a clear understanding of specific on the form and states clearly in sound mind, her wishes in the evenmt that she is incapable of speaking for herself at this time In summary she would like cPR,  iV fluids, nutrition including pEG feeds, should she require this to prolong life  A copy of  the form is retained for records and she has been given the original States her son and gandson are her hCPOA  ROS See above PE  BP (!) 148/85   Pulse 61   Resp 16   Wt 171 lb (77.6 kg)   SpO2 96%   BMI 34.54 kg/m   Patient alert and oriented and in no cardiopulmonary distress.  Psych: Good eye contact, normal affect. Memory intact not anxious or depressed appearing.  Competent to make decisions.   Assessment & Plan Advanced care planning/counseling discussion Most form completed, pt given original and copy kept for record25 minutes spent in face to face time with patient

## 2020-12-29 DIAGNOSIS — R2681 Unsteadiness on feet: Secondary | ICD-10-CM | POA: Diagnosis not present

## 2020-12-29 DIAGNOSIS — J449 Chronic obstructive pulmonary disease, unspecified: Secondary | ICD-10-CM | POA: Diagnosis not present

## 2020-12-29 DIAGNOSIS — E1142 Type 2 diabetes mellitus with diabetic polyneuropathy: Secondary | ICD-10-CM | POA: Diagnosis not present

## 2020-12-29 DIAGNOSIS — E039 Hypothyroidism, unspecified: Secondary | ICD-10-CM | POA: Diagnosis not present

## 2020-12-29 DIAGNOSIS — J42 Unspecified chronic bronchitis: Secondary | ICD-10-CM | POA: Diagnosis not present

## 2020-12-29 DIAGNOSIS — F17210 Nicotine dependence, cigarettes, uncomplicated: Secondary | ICD-10-CM | POA: Diagnosis not present

## 2020-12-29 DIAGNOSIS — F250 Schizoaffective disorder, bipolar type: Secondary | ICD-10-CM | POA: Diagnosis not present

## 2020-12-29 DIAGNOSIS — D649 Anemia, unspecified: Secondary | ICD-10-CM | POA: Diagnosis not present

## 2020-12-29 DIAGNOSIS — M160 Bilateral primary osteoarthritis of hip: Secondary | ICD-10-CM | POA: Diagnosis not present

## 2020-12-29 NOTE — Progress Notes (Signed)
[place], [place] [address]   CLINIC:  Medical Oncology/Hematology  PCP:  [name], MD [address] [place] [address] [phone]   REASON FOR VISIT:  Follow-up for microcytic anemia/iron deficiency anemia  PRIOR THERAPY: Iron tablets  CURRENT THERAPY: Intermittent Feraheme (last on 02/19/2020)  INTERVAL HISTORY:  [name] 70 y.o. female returns for routine follow-up of her microcytic iron deficiency anemia.  She was last seen by Tarri Abernethy PA-C on 09/22/2020.  At today's visit, she reports feeling drained.  No recent hospitalizations, surgeries, or changes in baseline health status.  Patient continues to have significant baseline fatigue, reports that she "just feels drained."  She had a fall in the shower the other week and reports that she hit her head and was diagnosed with a concussion.  She has had some recurrent dizziness since that time, and is following with her primary care provider for this.  She reports that her stools are dark from time to time, but denies any obvious melena, hematochezia, or hematemesis.  She continues to have chronic dyspnea on exertion, but denies any pica, restless legs, chest pain, lightheadedness, or syncope.  No B symptoms such as fever, chills, night sweats, unintentional weight loss.  She has 30% energy and 30% appetite. She endorses that she is maintaining a stable weight.    REVIEW OF SYSTEMS:  Review of Systems  Constitutional:  Positive for fatigue. Negative for appetite change, chills, diaphoresis, fever and unexpected weight change.  HENT:   Positive for trouble swallowing. Negative for lump/mass and nosebleeds.   Eyes:  Negative for eye problems.  Respiratory:  Positive for shortness of breath (With exertion). Negative for cough and hemoptysis.   Cardiovascular:  Positive for palpitations (Racing heart with exertion). Negative for chest pain and leg swelling.   Gastrointestinal:  Negative for abdominal pain, blood in stool, constipation, diarrhea, nausea and vomiting.  Genitourinary:  Negative for hematuria.   Musculoskeletal:  Positive for arthralgias, back pain and neck pain.  Skin: Negative.   Neurological:  Positive for dizziness and numbness (Chronic right hand numbness). Negative for headaches and light-headedness.  Hematological:  Does not bruise/bleed easily.  Psychiatric/Behavioral:  Positive for depression. The patient is nervous/anxious.      PAST MEDICAL/SURGICAL HISTORY:  Past Medical History:  Diagnosis Date   Allergy    Anemia    Anxiety    takes Ativan daily   Arthritis    Assistance needed for mobility    Bipolar disorder ([place])    takes Risperdal nightly   Blood transfusion    Brain tumor ([place])    Cancer ([place])    In her gum   Carpal tunnel syndrome of right wrist 05/23/2011   Cervical disc disorder with radiculopathy of cervical region 10/31/2012   Chronic back pain    Chronic idiopathic constipation    Chronic neck and back pain    Colon polyps    COPD (chronic obstructive pulmonary disease) with chronic bronchitis ([place]) 09/16/2013   Office Spirometry 10/30/2013-submaximal effort based on appearance of loop and curve. Numbers would fit with severe restriction but her physiologic capability may be better than this. FVC 0.91/44%, and 10.74/45%, FEV1/FVC 0.81, FEF 25-75% 1.43/69%     Diabetes mellitus    Type II   Diverticulosis    TCS 9/08 by Dr. Delfin Edis for diarrhea . Bx for micro scopic colitis negative.    Fibromyalgia    Frequent falls  GERD (gastroesophageal reflux disease)    takes Aciphex daily   Glaucoma    eye drops daily   Gum symptoms    infection on antibiotic   Hemiplegia affecting non-dominant side, post-stroke 08/02/2011   Hiatal hernia    Hyperlipidemia    takes Crestor daily   Hypertension    takes Amlodipine,Metoprolol,and Clonidine daily   Hypothyroidism    takes Synthroid daily   IBS  (irritable bowel syndrome)    Insomnia    takes Trazodone nightly   Major depression, recurrent ([place])    takes Zoloft daily   Malignant hyperpyrexia 4/69/6295   Metabolic encephalopathy 2/84/1324   Migraines    chronic headaches   Mononeuritis lower limb    Narcolepsy    Osteoporosis    Pancreatitis 2006   due to Depakote with normal EUS    Schatzki's ring    non critical / EGD with ED 8/2011with RMR   Seizures ([place])    takes Lamictal daily.Last seizure 3 yrs ago   Sleep apnea    on CPAP   Small bowel obstruction (H[facility])    Stroke ([place])    left sided weakness, speech changes   Tubular adenoma of colon    Past Surgical History:  Procedure Laterality Date   ABDOMINAL HYSTERECTOMY  1978   BACK SURGERY  July 2012   BACTERIAL OVERGROWTH TEST N/A 05/05/2013   Procedure: BACTERIAL OVERGROWTH TEST;  Surgeon: Daneil Dolin, MD;  [facility];  Service: Endoscopy;  Laterality: N/A;  7:30   BIOPSY THYROID  2009   BRAIN SURGERY  11/2011   resection of meningioma   BREAST REDUCTION SURGERY  1994   CARDIAC CATHETERIZATION  05/10/2005   normal coronaries, normal LV systolic function and EF (Dr. Jackie Plum)   [place] Left 07/22/04   Dr. Aline Brochure   CATARACT EXTRACTION Bilateral    CHOLECYSTECTOMY  1984   COLONOSCOPY N/A 09/25/2012   MWN:[id number] diverticulosis.  colonic polyp-removed : tubular adenoma   CRANIOTOMY  11/23/2011   Procedure: CRANIOTOMY TUMOR EXCISION;  Surgeon: Hosie Spangle, MD;  Location: [facility];  Service: Neurosurgery;  Laterality: N/A;  Craniotomy for tumor resection   ESOPHAGOGASTRODUODENOSCOPY  12/29/2010   [place]-Retained food in the esophagus and stomach, small hiatal hernia, status post Maloney dilation of the esophagus   ESOPHAGOGASTRODUODENOSCOPY N/A 09/25/2012   UYQ:[id number] atonic baggy esophagus status post Maloney dilation 20 F. Hiatal hernia   GIVENS CAPSULE STUDY N/A 01/15/2013   NORMAL.    IR GENERIC HISTORICAL  03/17/2016   IR  RADIOLOGIST EVAL & MGMT 03/17/2016 [facility] RAD   LESION REMOVAL N/A 05/31/2015   Procedure: REMOVAL RIGHT AND LEFT LESIONS OF MANDIBLE;  Surgeon: Diona Browner, DDS;  Location: [facility];  Service: Oral Surgery;  Laterality: N/A;   MALONEY DILATION  12/29/2010   RMR;   NM MYOCAR PERF WALL MOTION  2006   "relavtiely normal" persantine, mild anterior thinning (breast attenuation artifact), no region of scar/ischemia   OVARIAN CYST REMOVAL     RECTOCELE REPAIR N/A 06/29/2015   Procedure: POSTERIOR REPAIR (RECTOCELE);  Surgeon: Jonnie Kind, MD;  Location: [facility];  Service: Gynecology;  Laterality: N/A;   REDUCTION MAMMAPLASTY Bilateral    SPINE SURGERY  09/29/2010   Dr. Rolena Infante   surgical excision of 3 tumors from right thigh and right buttock  and left upper thigh  2010   TOOTH EXTRACTION Bilateral 12/14/2014   Procedure: REMOVAL OF BILATERAL MANDIBULAR EXOSTOSES;  Surgeon: Nicki Reaper  Hoyt Koch, DDS;  Location: [facility];  Service: Oral Surgery;  Laterality: Bilateral;   TRANSTHORACIC ECHOCARDIOGRAM  2010   EF 60-65%, mild conc LVH, grade 1 diastolic dysfunction; mildly calcified MV annulus with mildly thickened leaflets, mildly calcified MR annulus     SOCIAL HISTORY:  Social History   Socioeconomic History   Marital status: Divorced    Spouse name: Not on file   Number of children: 1   Years of education: 12   Highest education level: High school graduate  Occupational History   Occupation: Disabled  Tobacco Use   Smoking status: Former    Packs/day: 0.50    Years: 30.00    Pack years: 15.00    Types: Cigarettes    Quit date: 06/11/2020    Years since quitting: 0.5   Smokeless tobacco: Never   Tobacco comments:    quit 7 weeks ago-07/14/2020-[facility]   Vaping Use   Vaping Use: Never used  Substance and Sexual Activity   Alcohol use: No    Alcohol/week: 0.0 standard drinks    Comment:     Drug use: No   Sexual activity: Not Currently  Other Topics Concern   Not on file  Social History  Narrative   01/29/18 Lives alone, has 3 aides, Mon- Fri 8 hrs, 2 hrs on Sat-Sun, RN manages her meds   Caffeine use: Drink coffee sometimes    Right handed    Social Determinants of Radio broadcast assistant Strain: Low Risk    Difficulty of Paying Living Expenses: Not very hard  Food Insecurity: No Food Insecurity   Worried About Charity fundraiser in the Last Year: Never true   [place] in the Last Year: Never true  Transportation Needs: No Transportation Needs   Lack of Transportation (Medical): No   Lack of Transportation (Non-Medical): No  Physical Activity: Inactive   Days of Exercise per Week: 0 days   Minutes of Exercise per Session: 0 min  Stress: Not on file  Social Connections: Moderately Isolated   Frequency of Communication with Friends and Family: Three times a week   Frequency of Social Gatherings with Friends and Family: Twice a week   Attends Religious Services: More than 4 times per year   Active Member of [employer] or Organizations: No   Attends Music therapist: Never   Marital Status: Divorced  Human resources officer Violence: Not on file    FAMILY HISTORY:  Family History  Problem Relation Age of Onset   Heart attack Mother        HTN   Pneumonia Father    Kidney failure Father    Diabetes Father    Pancreatic cancer Sister    Cancer Sister        breast    Cancer Sister        pancreatic   Diabetes Brother    Hypertension Brother    Diabetes Brother    Alcohol abuse Maternal Uncle    Stroke Maternal Grandmother    Heart attack Maternal Grandfather    Hypertension Son    Sleep apnea Son    Colon cancer Neg Hx    Anesthesia problems Neg Hx    Hypotension Neg Hx    Malignant hyperthermia Neg Hx    Pseudochol deficiency Neg Hx    Breast cancer Neg Hx    Stomach cancer Neg Hx     CURRENT MEDICATIONS:  Outpatient Encounter Medications as of 12/30/2020  Medication  Sig   Accu-Chek Softclix Lancets lancets TEST BLOOD SUGAR THREE  TIMES DAILY AS DIRECTED   albuterol (VENTOLIN HFA) 108 (90 Base) MCG/ACT inhaler INHALE 1 TO 2 PUFFS EVERY 6 HOURS AS NEEDED FOR WHEEZING, SHORTNESS OF BREATH   Alcohol Swabs (DROPSAFE ALCOHOL PREP) 70 % PADS USE TO CLEAN FINGER PRIOR TO STICKING FOR BLOOD SUGAR.   alendronate (FOSAMAX) 70 MG tablet TAKE 1 TABLET (70 MG TOTAL) BY MOUTH EVERY 7 (SEVEN) DAYS. TAKE WITH A FULL GLASS OF WATER ON AN EMPTY STOMACH.   amLODipine (NORVASC) 10 MG tablet TAKE 1 TABLET EVERY DAY (Patient taking differently: Take 10 mg by mouth daily.)   ascorbic acid (VITAMIN C) 500 MG tablet Take 500 mg by mouth daily.   aspirin EC 81 MG tablet Take 1 tablet (81 mg total) by mouth daily with breakfast.   betamethasone dipropionate 0.05 % cream Apply topically 2 (two) times daily as needed (Rash).   Blood Glucose Calibration (ACCU-CHEK GUIDE CONTROL) LIQD USE AS DIRECTED   Blood Glucose Calibration (ACCU-CHEK GUIDE CONTROL) LIQD USE AS NEEDED TO CALIBRATE GLUCOMETER   blood glucose meter kit and supplies Dispense based on patient and insurance preference. Use up to four times daily as directed. (FOR ICD-10 E10.9, E11.9).   buPROPion (WELLBUTRIN XL) 150 MG 24 hr tablet Take 1 tablet (150 mg total) by mouth every morning.   cetirizine (ZYRTEC) 10 MG tablet Take 1 tablet (10 mg total) by mouth daily.   clobetasol cream (TEMOVATE) 1.54 % Apply 1 application topically 2 (two) times daily.    Continuous Blood Gluc Sensor (FREESTYLE LIBRE 14 DAY SENSOR) MISC 1 each by Does not apply route every 14 (fourteen) days. Change every 2 weeks   diclofenac Sodium (VOLTAREN) 1 % GEL APPLY 2 GRAMS  TOPICALLY FOUR TIMES DAILY. (Patient taking differently: Apply 2 g topically 4 (four) times daily.)   DROPLET PEN NEEDLES 31G X 8 MM MISC USE FOR INJECTING INSULIN 4 TIMES DAILY.   ezetimibe (ZETIA) 10 MG tablet TAKE 1 TABLET EVERY DAY (Patient taking differently: Take 10 mg by mouth daily.)   famotidine (PEPCID) 40 MG tablet Take 40 mg by mouth 2  (two) times daily.   Fluticasone-Umeclidin-Vilant (TRELEGY ELLIPTA) 100-62.5-25 MCG/INH AEPB Inhale 1 puff into the lungs daily.   glucose blood (ACCU-CHEK GUIDE) test strip USE TO CHECK BLOOD SUGAR FOUR TIMES A DAY AND AS NEEDED   hydrOXYzine (ATARAX/VISTARIL) 10 MG tablet Take 1 tablet (10 mg total) by mouth 3 (three) times daily as needed. (Patient taking differently: Take 10 mg by mouth 3 (three) times daily as needed for itching or anxiety.)   insulin aspart (NOVOLOG FLEXPEN) 100 UNIT/ML FlexPen INJECT 16 TO 18 UNITS UNDER SKIN UP TO 3 TIMES A DAY BEFORE MEALS  (DISCARD PEN 28 DAYS AFTER OPENING) (Patient taking differently: Inject 16-18 Units into the skin 3 (three) times daily with meals. (DISCARD PEN 28 DAYS AFTER OPENING))   lamoTRIgine (LAMICTAL) 100 MG tablet Take 1 tablet (100 mg total) by mouth 2 (two) times daily.   levothyroxine (SYNTHROID) 50 MCG tablet Take 1 tablet (50 mcg) 6 days 1/2 tablet (25 mcg) 1 day weekly.   LORazepam (ATIVAN) 0.5 MG tablet Take 1 tablet (0.5 mg total) by mouth 2 (two) times daily.   losartan (COZAAR) 100 MG tablet Take 1 tablet (100 mg total) by mouth daily.   meclizine (ANTIVERT) 25 MG tablet Take 1 tablet (25 mg total) by mouth 3 (three) times daily as needed for dizziness.  methocarbamol (ROBAXIN) 500 MG tablet Take one tablet by mouth two times daily , as needed, for neck and back pain and spasm (Patient taking differently: Take 500 mg by mouth 2 (two) times daily as needed for muscle spasms (for neck and back pain and spasm).)   metoprolol tartrate (LOPRESSOR) 50 MG tablet TAKE 1 TABLET TWICE DAILY (NEED MD APPOINTMENT) (Patient taking differently: Take 50 mg by mouth 2 (two) times daily.)   mirabegron ER (MYRBETRIQ) 25 MG TB24 tablet Take 1 tablet (25 mg total) by mouth daily.   montelukast (SINGULAIR) 10 MG tablet TAKE 1 TABLET EVERY DAY (Patient taking differently: Take 10 mg by mouth daily.)   nystatin (MYCOSTATIN/NYSTOP) powder APPLY TO AFFECTED  AREA 4 TIMES DAILY. (Patient taking differently: Apply 1 application topically in the morning, at noon, in the evening, and at bedtime.)   Omega-3 Fatty Acids (FISH OIL PO) Take 1 capsule by mouth daily.   oxyCODONE-acetaminophen (PERCOCET) 10-325 MG tablet Take 1 tablet by mouth 3 (three) times daily as needed for pain.   Plecanatide (TRULANCE) 3 MG TABS Take 3 mg by mouth daily as needed.   pregabalin (LYRICA) 75 MG capsule Take 1 capsule (75 mg total) by mouth daily.   RABEprazole (ACIPHEX) 20 MG tablet TAKE 1 TABLET TWICE DAILY   RESTASIS 0.05 % ophthalmic emulsion Place 2 drops into both eyes daily.   risperiDONE (RISPERDAL) 0.5 MG tablet Take 1 tablet (0.5 mg total) by mouth at bedtime.   rosuvastatin (CRESTOR) 5 MG tablet TAKE 1 TABLET AT BEDTIME (Patient taking differently: Take 5 mg by mouth daily.)   sertraline (ZOLOFT) 100 MG tablet Take 1 tablet (100 mg total) by mouth daily.   spironolactone (ALDACTONE) 25 MG tablet Take 1 tablet (25 mg total) by mouth daily.   STIOLTO RESPIMAT 2.5-2.5 MCG/ACT AERS INHALE 2 PUFFS INTO THE LUNGS DAILY. (Patient taking differently: Inhale 2 puffs into the lungs daily.)   TOUJEO MAX SOLOSTAR 300 UNIT/ML Solostar Pen INJECT 24 UNITS SUBCUTANEOUSLY INTO THE SKIN AT BEDTIME (Patient taking differently: Inject 24 Units into the skin at bedtime.)   traZODone (DESYREL) 100 MG tablet Take 1 tablet (100 mg total) by mouth at bedtime.   triamcinolone (KENALOG) 0.1 % Apply 1 application topically daily.   UNABLE TO [facility] bed mattress x 1  DX: G47.33, J44.9   UNABLE TO FIND Standard wheelchair Dx M47.16, R29.898   UNABLE TO [facility] bed with firm mattress x 1 DX G47.33, G89.4   No facility-administered encounter medications on file as of 12/30/2020.    ALLERGIES:  Allergies  Allergen Reactions   Lac Bovis Rash    Doesn't agree with stomach.    Phenazopyridine Hcl Hives   Cephalexin Hives   Flonase [Fluticasone]     "It gave me ulcers in  my nose"   Iron Nausea And Vomiting   Milk-Related Compounds Other (See Comments)    Doesn't agree with stomach.    Penicillins Hives    Has patient had a PCN reaction causing immediate rash, facial/tongue/throat swelling, SOB or lightheadedness with hypotension: Yes Has patient had a PCN reaction causing severe rash involving mucus membranes or skin necrosis: No Has patient had a PCN reaction that required hospitalization No Has patient had a PCN reaction occurring within the last 10 years: No If all of the above answers are "NO", then may proceed with Cephalosporin use.    Phenazopyridine Hcl Hives           PHYSICAL EXAM:  ECOG PERFORMANCE STATUS: 2 - Symptomatic, <50% confined to bed  There were no vitals filed for this visit. There were no vitals filed for this visit. Physical Exam Constitutional:      Appearance: Normal appearance. She is obese.  HENT:     Head: Normocephalic and atraumatic.     Mouth/Throat:     Mouth: Mucous membranes are moist.  Eyes:     Extraocular Movements: Extraocular movements intact.     Pupils: Pupils are equal, round, and reactive to light.  Cardiovascular:     Rate and Rhythm: Normal rate and regular rhythm.     Pulses: Normal pulses.     Heart sounds: Normal heart sounds.  Pulmonary:     Effort: Pulmonary effort is normal.     Breath sounds: Normal breath sounds.     Comments: Decreased breath sounds, auscultation limited by body habitus Abdominal:     General: Bowel sounds are normal.     Palpations: Abdomen is soft.     Tenderness: There is no abdominal tenderness.  Musculoskeletal:        General: No swelling.     Right lower leg: No edema.     Left lower leg: No edema.  Lymphadenopathy:     Cervical: No cervical adenopathy.  Skin:    General: Skin is warm and dry.  Neurological:     General: No focal deficit present.     Mental Status: She is alert and oriented to person, place, and time.     Motor: Weakness (Chronic right  hand hemiparesis) present.  Psychiatric:        Mood and Affect: Mood normal.        Behavior: Behavior normal.      LABORATORY DATA:  I have reviewed the labs as listed.  CBC    Component Value Date/Time   WBC 5.6 12/23/2020 1244   RBC 4.58 12/23/2020 1244   HGB 10.5 (L) 12/23/2020 1244   HGB 10.8 (L) 11/18/2020 1057   HCT 36.5 12/23/2020 1244   HCT 37.9 11/18/2020 1057   PLT 254 12/23/2020 1244   PLT 253 11/18/2020 1057   MCV 79.7 (L) 12/23/2020 1244   MCV 77 (L) 11/18/2020 1057   MCH 22.9 (L) 12/23/2020 1244   MCHC 28.8 (L) 12/23/2020 1244   RDW 14.1 12/23/2020 1244   RDW 13.1 11/18/2020 1057   LYMPHSABS 1.7 12/23/2020 1244   LYMPHSABS 2.1 11/18/2020 1057   MONOABS 0.5 12/23/2020 1244   EOSABS 0.2 12/23/2020 1244   EOSABS 0.2 11/18/2020 1057   BASOSABS 0.0 12/23/2020 1244   BASOSABS 0.0 11/18/2020 1057   CMP Latest Ref Rng & Units 12/23/2020 11/24/2020 11/18/2020  Glucose 70 - 99 mg/dL 110(H) 101(H) 46(L)  BUN 8 - 23 mg/dL 29(H) 24(H) 19  Creatinine 0.44 - 1.00 mg/dL 1.23(H) 1.10(H) 0.98  Sodium 135 - 145 mmol/L 142 142 146(H)  Potassium 3.5 - 5.1 mmol/L 4.4 4.1 4.2  Chloride 98 - 111 mmol/L 112(H) 107 108(H)  CO2 22 - 32 mmol/L _0 Calcium 8.9 - 10.3 mg/dL 9.3 9.5 9.2  Total Protein 6.5 - 8.1 g/dL 7.1 7.8 7.2  Total Bilirubin 0.3 - 1.2 mg/dL 0.6 0.2(L) 0.3  Alkaline Phos 38 - 126 U/L 78 108 93  AST 15 - 41 U/L _1 ALT 0 - 44 U/L _2 DIAGNOSTIC IMAGING:  I have independently reviewed the relevant imaging and discussed with the patient.  ASSESSMENT &  PLAN: 1.  Microcytic anemia - This is a combination anemia from functional iron deficiency (in the setting of chronic disease including COPD, type 2 diabetes, fibromyalgia, hypertension, and multiple other comorbidities listed in history) and chronic kidney disease stage III. - SPEP checked in 2019 was normal. - Colonoscopy (11/04/2020): Multiple diverticula, otherwise normal - EGD (11/04/2020):  Normal esophagus, gastritis, gastric lipoma - She was placed on iron pills, but could not tolerate them due to constipation. - Last Feraheme on 02/12/2020 on 02/19/2020. - She denies any bleeding per rectum or melena.   - Most recent labs (12/23/2020): Hgb 10.5/MCV 79.7; creatinine 1.23/GFR 47; ferritin 374, iron saturation 22% - PLAN: No indication for parenteral iron therapy at this time.  RTC 4 months with repeat labs.  We will consider Retacrit in the future if Hgb drops to < 10.0 in the setting of CKD stage III. - She does not see a nephrologist for her CKD stage III - we we will defer this decision to her PCP regarding ongoing management of her kidney disease   PLAN SUMMARY & DISPOSITION: Repeat labs and RTC in 4 months  All questions were answered. The patient knows to call the clinic with any problems, questions or concerns.  Medical decision making: Low  Time spent on visit: I spent 15 minutes counseling the patient face to face. The total time spent in the appointment was 25 minutes and more than 50% was on counseling.   Harriett Rush, PA-C  12/30/2020 3:06 PM

## 2020-12-30 ENCOUNTER — Other Ambulatory Visit: Payer: Self-pay

## 2020-12-30 ENCOUNTER — Telehealth: Payer: Self-pay

## 2020-12-30 ENCOUNTER — Inpatient Hospital Stay (HOSPITAL_BASED_OUTPATIENT_CLINIC_OR_DEPARTMENT_OTHER): Payer: Medicare HMO | Admitting: Physician Assistant

## 2020-12-30 VITALS — BP 142/84 | HR 62 | Temp 98.7°F | Resp 16 | Wt 169.5 lb

## 2020-12-30 DIAGNOSIS — N183 Chronic kidney disease, stage 3 unspecified: Secondary | ICD-10-CM | POA: Diagnosis not present

## 2020-12-30 DIAGNOSIS — D509 Iron deficiency anemia, unspecified: Secondary | ICD-10-CM | POA: Diagnosis not present

## 2020-12-30 DIAGNOSIS — J449 Chronic obstructive pulmonary disease, unspecified: Secondary | ICD-10-CM | POA: Diagnosis not present

## 2020-12-30 DIAGNOSIS — R5383 Other fatigue: Secondary | ICD-10-CM | POA: Diagnosis not present

## 2020-12-30 DIAGNOSIS — E119 Type 2 diabetes mellitus without complications: Secondary | ICD-10-CM | POA: Diagnosis not present

## 2020-12-30 DIAGNOSIS — K5900 Constipation, unspecified: Secondary | ICD-10-CM | POA: Diagnosis not present

## 2020-12-30 DIAGNOSIS — D631 Anemia in chronic kidney disease: Secondary | ICD-10-CM

## 2020-12-30 DIAGNOSIS — M797 Fibromyalgia: Secondary | ICD-10-CM | POA: Diagnosis not present

## 2020-12-30 DIAGNOSIS — Z79899 Other long term (current) drug therapy: Secondary | ICD-10-CM | POA: Diagnosis not present

## 2020-12-30 DIAGNOSIS — I129 Hypertensive chronic kidney disease with stage 1 through stage 4 chronic kidney disease, or unspecified chronic kidney disease: Secondary | ICD-10-CM | POA: Diagnosis not present

## 2020-12-30 NOTE — Telephone Encounter (Signed)
Patient chart note stated she was using triamcinolone not clobetasol due to cost. [facility] [phone]

## 2020-12-30 NOTE — Patient Instructions (Signed)
[place] at [facility] Discharge Instructions  You were seen today by Tarri Abernethy PA-C for your anemia (low blood).  Your blood levels are still slightly lower than normal, but do not need any treatment at this time.  We will continue to check your labs periodically and we will see you for a follow-up visit in 4 months.  Please talk to your primary care doctor about your kidney levels.  We suspect that you have some underlying chronic kidney disease stage III, which is likely contributing to your anemia (low blood).  At this stage, the most important thing you can do to prevent it from getting worse is to control your blood pressure and your diabetes, and to make sure that you drink plenty of water.  It is also important that you avoid "NSAID" medications such as ibuprofen, Aleve, Motrin, Advil, and naproxen.  If your primary care doctor thinks that you need to see a kidney specialist, she can write that referral for you.  LABS: Return in 4 months for repeat labs  FOLLOW-UP APPOINTMENT: Office visit in 4 months, after labs   Thank you for choosing [place] at [facility] to provide your oncology and hematology care.  To afford each patient quality time with our provider, please arrive at least 15 minutes before your scheduled appointment time.   If you have a lab appointment with the [facility] please come in thru the Main Entrance and check in at the main information desk.  You need to re-schedule your appointment should you arrive 10 or more minutes late.  We strive to give you quality time with our providers, and arriving late affects you and other patients whose appointments are after yours.  Also, if you no show three or more times for appointments you may be dismissed from the clinic at the providers discretion.     Again, thank you for choosing [facility].  Our hope is that these requests will decrease the amount of  time that you wait before being seen by our physicians.       _____________________________________________________________  Should you have questions after your visit to [facility], please contact our office at [phone] and follow the prompts.  Our office hours are 8:00 a.m. and 4:30 p.m. Monday - Friday.  Please note that voicemails left after 4:00 p.m. may not be returned until the following business day.  We are closed weekends and major holidays.  You do have access to a nurse 24-7, just call the main number to the clinic [phone] and do not press any options, hold on the line and a nurse will answer the phone.    For prescription refill requests, have your pharmacy contact our office and allow 72 hours.    Due to Covid, you will need to wear a mask upon entering the hospital. If you do not have a mask, a mask will be given to you at the Main Entrance upon arrival. For doctor visits, patients may have 1 support person age 52 or older with them. For treatment visits, patients can not have anyone with them due to social distancing guidelines and our immunocompromised population.

## 2021-01-03 DIAGNOSIS — D649 Anemia, unspecified: Secondary | ICD-10-CM | POA: Diagnosis not present

## 2021-01-03 DIAGNOSIS — J42 Unspecified chronic bronchitis: Secondary | ICD-10-CM | POA: Diagnosis not present

## 2021-01-03 DIAGNOSIS — E039 Hypothyroidism, unspecified: Secondary | ICD-10-CM | POA: Diagnosis not present

## 2021-01-03 DIAGNOSIS — R2681 Unsteadiness on feet: Secondary | ICD-10-CM | POA: Diagnosis not present

## 2021-01-03 DIAGNOSIS — F17210 Nicotine dependence, cigarettes, uncomplicated: Secondary | ICD-10-CM | POA: Diagnosis not present

## 2021-01-03 DIAGNOSIS — M160 Bilateral primary osteoarthritis of hip: Secondary | ICD-10-CM | POA: Diagnosis not present

## 2021-01-03 DIAGNOSIS — J449 Chronic obstructive pulmonary disease, unspecified: Secondary | ICD-10-CM | POA: Diagnosis not present

## 2021-01-03 DIAGNOSIS — F250 Schizoaffective disorder, bipolar type: Secondary | ICD-10-CM | POA: Diagnosis not present

## 2021-01-03 DIAGNOSIS — E1142 Type 2 diabetes mellitus with diabetic polyneuropathy: Secondary | ICD-10-CM | POA: Diagnosis not present

## 2021-01-04 ENCOUNTER — Telehealth: Payer: Self-pay

## 2021-01-04 NOTE — Telephone Encounter (Signed)
noted 

## 2021-01-04 NOTE — Telephone Encounter (Signed)
Patient called said no need to call anything in, her pharmacy [facility] has taken care of her for the blister on her lip.

## 2021-01-04 NOTE — Telephone Encounter (Signed)
Spoke with patient advised her to try some OTC meds for canker sores like Abreva etc.  If no improvement she will call back to make an appt

## 2021-01-04 NOTE — Telephone Encounter (Signed)
Please call back today regarding a canker sore on her lip.  She wants a rx so ins will pay for it because she does not have $$ for OTC meds.  Please call today.

## 2021-01-04 NOTE — Telephone Encounter (Signed)
Patient called she has an fever blister came up on her lip what does she need to do for it. Call back # [phone].

## 2021-01-06 DIAGNOSIS — M5412 Radiculopathy, cervical region: Secondary | ICD-10-CM | POA: Diagnosis not present

## 2021-01-13 ENCOUNTER — Encounter (HOSPITAL_COMMUNITY): Payer: Self-pay | Admitting: Psychiatry

## 2021-01-13 ENCOUNTER — Other Ambulatory Visit: Payer: Self-pay

## 2021-01-13 ENCOUNTER — Other Ambulatory Visit: Payer: Self-pay | Admitting: Internal Medicine

## 2021-01-13 ENCOUNTER — Telehealth: Payer: Self-pay | Admitting: Family Medicine

## 2021-01-13 ENCOUNTER — Telehealth (INDEPENDENT_AMBULATORY_CARE_PROVIDER_SITE_OTHER): Payer: Medicare HMO | Admitting: Psychiatry

## 2021-01-13 DIAGNOSIS — F331 Major depressive disorder, recurrent, moderate: Secondary | ICD-10-CM

## 2021-01-13 MED ORDER — LORAZEPAM 0.5 MG PO TABS: 0.5000 mg | ORAL_TABLET | Freq: Two times a day (BID) | ORAL | 2 refills | Status: DC

## 2021-01-13 MED ORDER — TRAZODONE HCL 150 MG PO TABS: 150.0000 mg | ORAL_TABLET | Freq: Every day | ORAL | 2 refills | Status: DC

## 2021-01-13 MED ORDER — SERTRALINE HCL 100 MG PO TABS: 100.0000 mg | ORAL_TABLET | Freq: Every day | ORAL | 2 refills | Status: DC

## 2021-01-13 MED ORDER — CLOBETASOL PROPIONATE 0.05 % EX FOAM
Freq: Two times a day (BID) | CUTANEOUS | 0 refills | Status: DC
Start: 2021-01-13 — End: 2021-03-24

## 2021-01-13 MED ORDER — BUPROPION HCL ER (XL) 150 MG PO TB24: 150.0000 mg | ORAL_TABLET | ORAL | 2 refills | Status: DC

## 2021-01-13 MED ORDER — LAMOTRIGINE 100 MG PO TABS: 100.0000 mg | ORAL_TABLET | Freq: Two times a day (BID) | ORAL | 2 refills | Status: DC

## 2021-01-13 MED ORDER — RISPERIDONE 0.5 MG PO TABS: 0.5000 mg | ORAL_TABLET | Freq: Every day | ORAL | 2 refills | Status: DC

## 2021-01-13 NOTE — Telephone Encounter (Signed)
Patient wanted refill for clobetasol foam for scalp I asked her about the d/c note due to cost and allergy. She said no allergy she has used it before and she wants its to go to [facility]

## 2021-01-13 NOTE — Telephone Encounter (Signed)
Pt called in with BP concerns.  Pt states that her BP is staying high in the 150s  Wants to discuss

## 2021-01-13 NOTE — Progress Notes (Signed)
Virtual Visit via Telephone Note  I connected with [name] on 01/13/21 at  4:00 PM EDT by telephone and verified that I am speaking with the correct person using two identifiers.  Location: Patient: home Provider: office   I discussed the limitations, risks, security and privacy concerns of performing an evaluation and management service by telephone and the availability of in person appointments. I also discussed with the patient that there may be a patient responsible charge related to this service. The patient expressed understanding and agreed to proceed.     I discussed the assessment and treatment plan with the patient. The patient was provided an opportunity to ask questions and all were answered. The patient agreed with the plan and demonstrated an understanding of the instructions.   The patient was advised to call back or seek an in-person evaluation if the symptoms worsen or if the condition fails to improve as anticipated.  I provided 14 minutes of non-face-to-face time during this encounter.   Levonne Spiller, MD  [facility] MD/PA/NP OP Progress Note  01/13/2021 4:31 PM [name]  MRN:  [id number]  Chief Complaint:  Chief Complaint   Depression; Anxiety; Follow-up    HPI: This patient is a 70 year old divorced white female who lives alone in [place].  She had worked in a [employer] but is now on disability.  The patient returns for follow-up after 3 months.  She states that she is doing okay but she is not able to sleep.  She is often up till 3 or 4 in the morning.  Unfortunately she suffered a fall in August and had a hematoma in the back of her head.  Her brain CT did not show any acute injury.  I am reluctant to increase medicine too much because I do not want her to get lightheaded or fall.  However I am agreeable to going up a little bit on her trazodone.  She denies significant depression or suicidal ideation.  As usual she has a lot of aches and pains. Visit  Diagnosis:    ICD-10-CM   1. Major depressive disorder, recurrent episode, moderate (HCC)  F33.1       Past Psychiatric History: Numerous hospitalizations for depression years ago including ECT treatment  Past Medical History:  Past Medical History:  Diagnosis Date   Allergy    Anemia    Anxiety    takes Ativan daily   Arthritis    Assistance needed for mobility    Bipolar disorder ([place])    takes Risperdal nightly   Blood transfusion    Brain tumor ([place])    Cancer ([place])    In her gum   Carpal tunnel syndrome of right wrist 05/23/2011   Cervical disc disorder with radiculopathy of cervical region 10/31/2012   Chronic back pain    Chronic idiopathic constipation    Chronic neck and back pain    Colon polyps    COPD (chronic obstructive pulmonary disease) with chronic bronchitis ([place]) 09/16/2013   Office Spirometry 10/30/2013-submaximal effort based on appearance of loop and curve. Numbers would fit with severe restriction but her physiologic capability may be better than this. FVC 0.91/44%, and 10.74/45%, FEV1/FVC 0.81, FEF 25-75% 1.43/69%     Diabetes mellitus    Type II   Diverticulosis    TCS 9/08 by Dr. Delfin Edis for diarrhea . Bx for micro scopic colitis negative.    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   Metabolic encephalopathy 7/68/1157   Migraines    chronic headaches   Mononeuritis lower limb    Narcolepsy    Osteoporosis     Pancreatitis 2006   due to Depakote with normal EUS    Schatzki's ring    non critical / EGD with ED 8/2011with RMR   Seizures ([place])    takes Lamictal daily.Last seizure 3 yrs ago   Sleep apnea    on CPAP   Small bowel obstruction (HCC)    Stroke ([place])    left sided weakness, speech changes   Tubular adenoma of colon     Past Surgical History:  Procedure Laterality Date   ABDOMINAL HYSTERECTOMY  1978   BACK SURGERY  July 2012   BACTERIAL OVERGROWTH TEST N/A 05/05/2013   Procedure: BACTERIAL OVERGROWTH TEST;  Surgeon: Daneil Dolin, MD;  [facility];  Service: Endoscopy;  Laterality: N/A;  7:30   BIOPSY THYROID  2009   BRAIN SURGERY  11/2011   resection of meningioma   BREAST REDUCTION SURGERY  1994   CARDIAC CATHETERIZATION  05/10/2005   normal coronaries, normal LV systolic function and EF (Dr. Jackie Plum)   [place] Left 07/22/04   Dr. Aline Brochure   CATARACT EXTRACTION Bilateral    CHOLECYSTECTOMY  1984   COLONOSCOPY N/A 09/25/2012   WIO:MBTDHRC diverticulosis.  colonic polyp-removed : tubular adenoma   CRANIOTOMY  11/23/2011   Procedure: CRANIOTOMY TUMOR EXCISION;  Surgeon: Hosie Spangle, MD;  Location: [facility] ORS;  Service: Neurosurgery;  Laterality: N/A;  Craniotomy for tumor resection   ESOPHAGOGASTRODUODENOSCOPY  12/29/2010   [place]-Retained food in the esophagus and stomach, small hiatal hernia, status post Maloney dilation of the esophagus   ESOPHAGOGASTRODUODENOSCOPY N/A 09/25/2012   BUL:AGTXMIWO atonic baggy esophagus status post Maloney dilation 19 F. Hiatal hernia   GIVENS CAPSULE STUDY N/A 01/15/2013   NORMAL.    IR GENERIC HISTORICAL  03/17/2016   IR RADIOLOGIST EVAL & MGMT 03/17/2016 [facility]-INTER[facility] RAD   LESION REMOVAL N/A 05/31/2015   Procedure: REMOVAL RIGHT AND LEFT LESIONS OF MANDIBLE;  Surgeon: Diona Browner, DDS;  Location: [place] [facility];  Service: Oral Surgery;  Laterality: N/A;   MALONEY DILATION  12/29/2010   RMR;   NM MYOCAR PERF WALL MOTION   2006   "relavtiely normal" persantine, mild anterior thinning (breast attenuation artifact), no region of scar/ischemia   OVARIAN CYST REMOVAL     RECTOCELE REPAIR N/A 06/29/2015   Procedure: POSTERIOR REPAIR (RECTOCELE);  Surgeon: Jonnie Kind, MD;  Location: [facility];  Service: Gynecology;  Laterality: N/A;   REDUCTION MAMMAPLASTY Bilateral    SPINE SURGERY  09/29/2010   Dr. Rolena Infante   surgical excision of 3 tumors from right thigh and right buttock  and left upper thigh  2010   TOOTH EXTRACTION Bilateral 12/14/2014   Procedure: REMOVAL OF BILATERAL MANDIBULAR EXOSTOSES;  Surgeon: Diona Browner, DDS;  Location: [facility] OR;  Service: Oral Surgery;  Laterality: Bilateral;   TRANSTHORACIC ECHOCARDIOGRAM  2010   EF 60-65%, mild conc LVH, grade 1 diastolic dysfunction; mildly calcified MV annulus with mildly thickened leaflets, mildly calcified MR annulus    Family Psychiatric History: see below  Family History:  Family History  Problem Relation Age of Onset   Heart attack Mother        HTN   Pneumonia Father    Kidney failure Father    Diabetes Father    Pancreatic cancer Sister    Cancer Sister        breast    Cancer Sister        pancreatic   Diabetes Brother    Hypertension Brother    Diabetes Brother    Alcohol abuse Maternal Uncle    Stroke Maternal Grandmother    Heart attack Maternal Grandfather    Hypertension Son    Sleep apnea Son    Colon cancer Neg Hx    Anesthesia problems Neg Hx    Hypotension Neg Hx    Malignant hyperthermia Neg Hx    Pseudochol deficiency Neg Hx    Breast cancer Neg Hx    Stomach cancer Neg Hx     Social History:  Social History   Socioeconomic History   Marital status: Divorced    Spouse name: Not on file   Number of children: 1   Years of education: 12   Highest education level: High school graduate  Occupational History   Occupation: Disabled  Tobacco Use   Smoking status: Former    Packs/day: 0.50    Years: 30.00    Pack years:  15.00    Types: Cigarettes    Quit date: 06/11/2020    Years since quitting: 0.5   Smokeless tobacco: Never   Tobacco comments:    quit 7 weeks ago-07/14/2020-[facility]   Vaping Use   Vaping Use: Never used  Substance and Sexual Activity   Alcohol use: No    Alcohol/week: 0.0 standard drinks    Comment:     Drug use: No   Sexual activity: Not Currently  Other Topics Concern   Not on file  Social History Narrative   01/29/18 Lives alone, has 3 aides, Mon- Fri 8 hrs, 2 hrs on Sat-Sun, RN manages her meds   Caffeine use: Drink coffee sometimes    Right handed    Social Determinants of Radio broadcast assistant Strain: Low Risk    Difficulty of Paying Living Expenses: Not very hard  Food Insecurity: No Food Insecurity   Worried About Charity fundraiser in the Last Year: Never true   [place] in the Last Year: Never true  Transportation Needs: No Transportation Needs   Lack of Transportation (Medical): No   Lack of Transportation (Non-Medical): No  Physical Activity: Inactive   Days of Exercise per Week: 0 days   Minutes of Exercise per Session: 0 min  Stress: Not on file  Social Connections: Moderately Isolated   Frequency of Communication with Friends and Family: Three times a week   Frequency of Social Gatherings with Friends and Family: Twice a week   Attends Religious Services: More than 4 times per year   Active Member of [employer] or Organizations: No   Attends Archivist Meetings: Never   Marital Status: Divorced    Allergies:  Allergies  Allergen Reactions   Lac Bovis Rash    Doesn't agree  with stomach.    Phenazopyridine Hcl Hives   Cephalexin Hives   Flonase [Fluticasone]     "It gave me ulcers in my nose"   Iron Nausea And Vomiting   Milk-Related Compounds Other (See Comments)    Doesn't agree with stomach.    Penicillins Hives    Has patient had a PCN reaction causing immediate rash, facial/tongue/throat swelling, SOB or lightheadedness with  hypotension: Yes Has patient had a PCN reaction causing severe rash involving mucus membranes or skin necrosis: No Has patient had a PCN reaction that required hospitalization No Has patient had a PCN reaction occurring within the last 10 years: No If all of the above answers are "NO", then may proceed with Cephalosporin use.    Phenazopyridine Hives   Phenazopyridine Hcl Hives          Metabolic Disorder Labs: Lab Results  Component Value Date   HGBA1C 5.8 (A) 07/29/2020   MPG 108 09/30/2018   MPG 134 10/15/2016   No results found for: PROLACTIN Lab Results  Component Value Date   CHOL 128 09/01/2020   TRIG 232 (H) 09/01/2020   HDL 40 09/01/2020   CHOLHDL 3.2 09/01/2020   VLDL 26 07/19/2015   LDLCALC 51 09/01/2020   LDLCALC 59 09/23/2019   Lab Results  Component Value Date   TSH 0.631 12/11/2019   TSH 0.94 02/17/2019    Therapeutic Level Labs: Lab Results  Component Value Date   LITHIUM <0.06 (L) 10/15/2016   Lab Results  Component Value Date   VALPROATE <10.0 (L) 08/23/2007   No components found for:  CBMZ  Current Medications: Current Outpatient Medications  Medication Sig Dispense Refill   traZODone (DESYREL) 150 MG tablet Take 1 tablet (150 mg total) by mouth at bedtime. 90 tablet 2   Accu-Chek Softclix Lancets lancets TEST BLOOD SUGAR THREE TIMES DAILY AS DIRECTED 300 each 2   albuterol (VENTOLIN HFA) 108 (90 Base) MCG/ACT inhaler INHALE 1 TO 2 PUFFS EVERY 6 HOURS AS NEEDED FOR WHEEZING, SHORTNESS OF BREATH 3 each 1   Alcohol Swabs (DROPSAFE ALCOHOL PREP) 70 % PADS USE TO CLEAN FINGER PRIOR TO STICKING FOR BLOOD SUGAR. 200 each 0   alendronate (FOSAMAX) 70 MG tablet TAKE 1 TABLET (70 MG TOTAL) BY MOUTH EVERY 7 (SEVEN) DAYS. TAKE WITH A FULL GLASS OF WATER ON AN EMPTY STOMACH. 12 tablet 3   amLODipine (NORVASC) 10 MG tablet TAKE 1 TABLET EVERY DAY (Patient taking differently: Take 10 mg by mouth daily.) 90 tablet 1   ascorbic acid (VITAMIN C) 500 MG tablet  Take 500 mg by mouth daily.     aspirin EC 81 MG tablet Take 1 tablet (81 mg total) by mouth daily with breakfast. 120 tablet 2   betamethasone dipropionate 0.05 % cream Apply topically 2 (two) times daily as needed (Rash). 45 g 3   Blood Glucose Calibration (ACCU-CHEK GUIDE CONTROL) LIQD USE AS DIRECTED 1 each 0   Blood Glucose Calibration (ACCU-CHEK GUIDE CONTROL) LIQD USE AS NEEDED TO CALIBRATE GLUCOMETER 1 each 0   blood glucose meter kit and supplies Dispense based on patient and insurance preference. Use up to four times daily as directed. (FOR ICD-10 E10.9, E11.9). 1 each 0   buPROPion (WELLBUTRIN XL) 150 MG 24 hr tablet Take 1 tablet (150 mg total) by mouth every morning. 90 tablet 2   cetirizine (ZYRTEC) 10 MG tablet Take 1 tablet (10 mg total) by mouth daily. 7 tablet 0   clobetasol (OLUX)  0.05 % topical foam Apply topically 2 (two) times daily. 50 g 0   Continuous Blood Gluc Sensor (FREESTYLE LIBRE 14 DAY SENSOR) MISC 1 each by Does not apply route every 14 (fourteen) days. Change every 2 weeks 2 each 11   diclofenac Sodium (VOLTAREN) 1 % GEL APPLY 2 GRAMS  TOPICALLY FOUR TIMES DAILY. (Patient taking differently: Apply 2 g topically 4 (four) times daily.) 700 g 1   DROPLET PEN NEEDLES 31G X 8 MM MISC USE FOR INJECTING INSULIN 4 TIMES DAILY. 400 each 0   ezetimibe (ZETIA) 10 MG tablet TAKE 1 TABLET EVERY DAY (Patient taking differently: Take 10 mg by mouth daily.) 90 tablet 1   famotidine (PEPCID) 40 MG tablet Take 40 mg by mouth 2 (two) times daily.     Fluticasone-Umeclidin-Vilant (TRELEGY ELLIPTA) 100-62.5-25 MCG/INH AEPB Inhale 1 puff into the lungs daily. 120 each 0   glucose blood (ACCU-CHEK GUIDE) test strip USE TO CHECK BLOOD SUGAR FOUR TIMES A DAY AND AS NEEDED 200 strip 0   hydrOXYzine (ATARAX/VISTARIL) 10 MG tablet Take 1 tablet (10 mg total) by mouth 3 (three) times daily as needed. (Patient taking differently: Take 10 mg by mouth 3 (three) times daily as needed for itching or  anxiety.) 30 tablet 0   insulin aspart (NOVOLOG FLEXPEN) 100 UNIT/ML FlexPen INJECT 16 TO 18 UNITS UNDER SKIN UP TO 3 TIMES A DAY BEFORE MEALS  (DISCARD PEN 28 DAYS AFTER OPENING) (Patient taking differently: Inject 16-18 Units into the skin 3 (three) times daily with meals. (DISCARD PEN 28 DAYS AFTER OPENING)) 60 mL 0   lamoTRIgine (LAMICTAL) 100 MG tablet Take 1 tablet (100 mg total) by mouth 2 (two) times daily. 180 tablet 2   levothyroxine (SYNTHROID) 50 MCG tablet Take 1 tablet (50 mcg) 6 days 1/2 tablet (25 mcg) 1 day weekly. 80 tablet 3   LORazepam (ATIVAN) 0.5 MG tablet Take 1 tablet (0.5 mg total) by mouth 2 (two) times daily. 60 tablet 2   losartan (COZAAR) 100 MG tablet Take 1 tablet (100 mg total) by mouth daily. 30 tablet 2   meclizine (ANTIVERT) 25 MG tablet Take 1 tablet (25 mg total) by mouth 3 (three) times daily as needed for dizziness. 30 tablet 0   methocarbamol (ROBAXIN) 500 MG tablet Take one tablet by mouth two times daily , as needed, for neck and back pain and spasm (Patient taking differently: Take 500 mg by mouth 2 (two) times daily as needed for muscle spasms (for neck and back pain and spasm).) 20 tablet 0   metoprolol tartrate (LOPRESSOR) 50 MG tablet TAKE 1 TABLET TWICE DAILY (NEED MD APPOINTMENT) (Patient taking differently: Take 50 mg by mouth 2 (two) times daily.) 180 tablet 3   mirabegron ER (MYRBETRIQ) 25 MG TB24 tablet Take 1 tablet (25 mg total) by mouth daily. 30 tablet 2   montelukast (SINGULAIR) 10 MG tablet TAKE 1 TABLET EVERY DAY (Patient taking differently: Take 10 mg by mouth daily.) 90 tablet 1   nystatin (MYCOSTATIN/NYSTOP) powder APPLY TO AFFECTED AREA 4 TIMES DAILY. (Patient taking differently: Apply 1 application topically in the morning, at noon, in the evening, and at bedtime.) 90 g 1   Omega-3 Fatty Acids (FISH OIL PO) Take 1 capsule by mouth daily.     oxyCODONE-acetaminophen (PERCOCET) 10-325 MG tablet Take 1 tablet by mouth 3 (three) times daily as  needed for pain.     Plecanatide (TRULANCE) 3 MG TABS Take 3 mg by mouth daily  as needed. 30 tablet 0   pregabalin (LYRICA) 75 MG capsule Take 1 capsule (75 mg total) by mouth daily.     RABEprazole (ACIPHEX) 20 MG tablet TAKE 1 TABLET TWICE DAILY 180 tablet 0   RESTASIS 0.05 % ophthalmic emulsion Place 2 drops into both eyes daily.     risperiDONE (RISPERDAL) 0.5 MG tablet Take 1 tablet (0.5 mg total) by mouth at bedtime. 90 tablet 2   rosuvastatin (CRESTOR) 5 MG tablet TAKE 1 TABLET AT BEDTIME (Patient taking differently: Take 5 mg by mouth daily.) 90 tablet 2   sertraline (ZOLOFT) 100 MG tablet Take 1 tablet (100 mg total) by mouth daily. 90 tablet 2   spironolactone (ALDACTONE) 25 MG tablet Take 1 tablet (25 mg total) by mouth daily. 30 tablet 2   STIOLTO RESPIMAT 2.5-2.5 MCG/ACT AERS INHALE 2 PUFFS INTO THE LUNGS DAILY. (Patient taking differently: Inhale 2 puffs into the lungs daily.) 12 g 1   TOUJEO MAX SOLOSTAR 300 UNIT/ML Solostar Pen INJECT 24 UNITS SUBCUTANEOUSLY INTO THE SKIN AT BEDTIME (Patient taking differently: Inject 24 Units into the skin at bedtime.) 12 mL 3   traZODone (DESYREL) 100 MG tablet Take 1 tablet (100 mg total) by mouth at bedtime. 90 tablet 2   triamcinolone (KENALOG) 0.1 % Apply 1 application topically daily. 15 g 1   UNABLE TO [facility] bed mattress x 1  DX: G47.33, J44.9 1 each 0   UNABLE TO FIND Standard wheelchair Dx M47.16, R29.898 1 each 0   UNABLE TO [facility] bed with firm mattress x 1 DX G47.33, G89.4 1 each 0   No current facility-administered medications for this visit.     Musculoskeletal: Strength & Muscle Tone: na Gait & Station: na Patient leans: N/A  Psychiatric Specialty Exam: Review of Systems  Musculoskeletal:  Positive for arthralgias, back pain and gait problem.  All other systems reviewed and are negative.  There were no vitals taken for this visit.There is no height or weight on file to calculate BMI.  General  Appearance: NA  Eye Contact:  NA  Speech:  NA  Volume:  Normal  Mood:  Anxious  Affect:  NA  Thought Process:  Goal Directed  Orientation:  Full (Time, Place, and Person)  Thought Content: Rumination   Suicidal Thoughts:  No  Homicidal Thoughts:  No  Memory:  Immediate;   Good Recent;   Fair Remote;   NA  Judgement:  Good  Insight:  Fair  Psychomotor Activity:  Decreased  Concentration:  Concentration: Fair and Attention Span: Fair  Recall:  [employer] of Knowledge: Fair  Language: Good  Akathisia:  No  Handed:  Right  AIMS (if indicated): not done  Assets:  Communication Skills Desire for Improvement Resilience Social Support Talents/Skills  ADL's:  Intact  Cognition: WNL  Sleep:  Poor   Screenings: Mini-Mental    Flowsheet Row Office Visit from 11/24/2020 in [facility] Office Visit from 02/03/2019 in [facility] Office Visit from 09/08/2015 in [facility] from 07/02/2014 in [facility] from 12/08/2013 in [facility]  Total Score (max 30 points ) 23 27 24 26 27       PHQ2-9    Flowsheet Row Video Visit from 01/13/2021 in [facility] Office Visit from 11/10/2020 in [facility] Office Visit from 10/20/2020 in [facility] Visit from 10/15/2020 in  [facility]2020 [facility]  PHQ-2 Total Score 2 2 0 0 0  PHQ-9 Total Score 7 8 0 -- --      Flowsheet Row ED from 11/24/2020 in [facility] from 10/16/2020 in [facility] Video Visit from 10/13/2020 in [facility] No Risk No Risk No Risk        Assessment and Plan: This patient is a 70 year old female with a long history of depression and anxiety.  She is not sleeping well so  we will continue trazodone but increase the dosage to 150 mg at bedtime for sleep.  She will continue Wellbutrin XL 150 mg daily as well as Zoloft 100 mg daily for depression, Lamictal 100 mg twice daily for mood stabilization and Ativan 0.5 mg twice daily for anxiety and respite all 0.5 mg at bedtime for mood stabilization.  She will return to see me in 3 months   Levonne Spiller, MD 01/13/2021, 4:31 PM

## 2021-01-13 NOTE — Addendum Note (Signed)
Addended by: Sheran Lawless on: 01/13/2021 10:58 AM   Modules accepted: Orders

## 2021-01-17 ENCOUNTER — Ambulatory Visit (INDEPENDENT_AMBULATORY_CARE_PROVIDER_SITE_OTHER): Payer: Medicare HMO | Admitting: Internal Medicine

## 2021-01-17 ENCOUNTER — Encounter: Payer: Self-pay | Admitting: Internal Medicine

## 2021-01-17 ENCOUNTER — Other Ambulatory Visit: Payer: Self-pay

## 2021-01-17 VITALS — BP 130/82 | HR 60 | Ht 59.0 in | Wt 167.2 lb

## 2021-01-17 DIAGNOSIS — F250 Schizoaffective disorder, bipolar type: Secondary | ICD-10-CM | POA: Diagnosis not present

## 2021-01-17 DIAGNOSIS — E1165 Type 2 diabetes mellitus with hyperglycemia: Secondary | ICD-10-CM

## 2021-01-17 DIAGNOSIS — E039 Hypothyroidism, unspecified: Secondary | ICD-10-CM | POA: Diagnosis not present

## 2021-01-17 DIAGNOSIS — F17210 Nicotine dependence, cigarettes, uncomplicated: Secondary | ICD-10-CM | POA: Diagnosis not present

## 2021-01-17 DIAGNOSIS — J42 Unspecified chronic bronchitis: Secondary | ICD-10-CM | POA: Diagnosis not present

## 2021-01-17 DIAGNOSIS — J449 Chronic obstructive pulmonary disease, unspecified: Secondary | ICD-10-CM | POA: Diagnosis not present

## 2021-01-17 DIAGNOSIS — M160 Bilateral primary osteoarthritis of hip: Secondary | ICD-10-CM | POA: Diagnosis not present

## 2021-01-17 DIAGNOSIS — E1142 Type 2 diabetes mellitus with diabetic polyneuropathy: Secondary | ICD-10-CM | POA: Diagnosis not present

## 2021-01-17 DIAGNOSIS — E042 Nontoxic multinodular goiter: Secondary | ICD-10-CM | POA: Diagnosis not present

## 2021-01-17 DIAGNOSIS — Z794 Long term (current) use of insulin: Secondary | ICD-10-CM | POA: Diagnosis not present

## 2021-01-17 DIAGNOSIS — R2681 Unsteadiness on feet: Secondary | ICD-10-CM | POA: Diagnosis not present

## 2021-01-17 DIAGNOSIS — D649 Anemia, unspecified: Secondary | ICD-10-CM | POA: Diagnosis not present

## 2021-01-17 LAB — POCT GLYCOSYLATED HEMOGLOBIN (HGB A1C): Hemoglobin A1C: 5.9 % — AB (ref 4.0–5.6)

## 2021-01-17 NOTE — Addendum Note (Signed)
Addended by: Lauralyn Primes on: 01/17/2021 12:07 PM   Modules accepted: Orders

## 2021-01-17 NOTE — Patient Instructions (Addendum)
Please continue: - Toujeo 28 units daily - Novolog 16 units for smaller meals 18 units for large meals (20-22 units after steroid injections)  Please continue levothyroxine 50 mcg daily.  Take the thyroid hormone every day, with water, at least 30 minutes before breakfast, separated by at least 4 hours from: - acid reflux medications - calcium - iron - multivitamins  MOVE ACIPHEX AT LEAST 4 HOURS AFTER LEVOTHYROXINE.  The most common suppliers for the continuous glucose monitor are:  [facility]: [phone] Ext [place]: [place]: [place]: [phone] [place]: [phone] [name] [facility]: [phone]  Please return in 4 months.

## 2021-01-17 NOTE — Progress Notes (Addendum)
Patient ID: [name], female   DOB: Feb 14, 1951, 70 y.o.   MRN: [id number]  This visit occurred during the SARS-CoV-2 public health emergency.  Safety protocols were in place, including screening questions prior to the visit, additional usage of staff PPE, and extensive cleaning of exam room while observing appropriate contact time as indicated for disinfecting solutions.   HPI: [name] is a 70 y.o.-year-old female, initially referred by her PCP, [name], presenting for follow-up for DM2, dx 1995, insulin-dependent since 1997, uncontrolled, with complications (gastroparesis, cerebrovasc. Ds-  H/o stroke, PN),  Hypothyroidism, thyroid nodules.  Last visit 5.5 months ago.  Interim history: She continues to get steroid injections in her shoulder last 2 weeks ago >> Gly 395.  She sees pain medicine. She also continues to have neck pain, headaches, disequilibrium.  She finished physical therapy. Since last visit, she was in the emergency for transient alteration of awareness 11/24/2020 after a fall during which she hit her head.  She does have recurrent falls. She has blurry vision, but no chest pain, palpitations or increased urination.  DM2: Reviewed HbA1c levels: Lab Results  Component Value Date   HGBA1C 5.8 (A) 07/29/2020   HGBA1C 5.3 03/25/2020   HGBA1C 5.9 (A) 11/06/2019  10/28/2013: HbA1c 8.5%  She is on: - Toujeo 23 >> 28 units at bedtime - Novolog: 10-14 >> 16-18 units before a meal (20-22 units while on steroids) Could not tolerate Metformin >> nausea.   Insulin doses were decreased in 04/2017 due to low blood sugars in the 40s.  Pt checks her sugars 4 times a day per review of her log: - am:  64, 95-146, 160, 170 >> 46, 60, 81-169, 199, 228 >> 81-147, 168 - 2h after b'fast: n/c >> 110 >> n/c - before lunch: 79, 89-153, 178, 265 >> 69-153, 166, 214 >> 57, 95-150, 178, 193 - 2h after lunch:  n/c >> 127, 215, 241 >> n/c - before dinner: 80-173, 181, 223 >> 70-185,  241-282 with steroids >> 106-191, 196 - 2h after dinner: n/c - bedtime: 89-201, 246, 488 (steroids) >> 91, 121-85, 204, 262 >> 99-272, 365 >> 60, 104-173 - nighttime: n/c >> 134 >> n/c Lowest sugar was 64 >> 40s; she has hypoglycemia awareness in the 70s. Highest sugar was 542 (steroid inj) >> 265 >> 365 (steroids) >> 395 (steroid)  Pt's meals are: - Breakfast: eggs, cereals, fruit, oatmeal, bacon - Lunch: sandwich, beef, mashed potatoes,diet soda - Dinner: chicken, beef, fish, potatoes, vegetables - Snacks: 1-2: fruit  -+ Mild CKD; last BUN/creatinine:  Lab Results  Component Value Date   BUN 29 (H) 12/23/2020   CREATININE 1.23 (H) 12/23/2020  On losartan.  -+ HL; last set of lipids: Lab Results  Component Value Date   CHOL 128 09/01/2020   HDL 40 09/01/2020   LDLCALC 51 09/01/2020   TRIG 232 (H) 09/01/2020   CHOLHDL 3.2 09/01/2020  On Crestor and Zetia  - last eye exam was in 11/2020: + DR; Had cataract sx B. Dr. Hassell Done [facility]). + L eye pain.  -She has numbness and tingling mainly in the left leg-affected by stroke. Sees podiatry.  Hypothyroidism: -Diagnosed many years ago  She takes levothyroxine 50 mcg daily: - in am - fasting - at least 30 min from b'fast - no calcium - no iron - + multivitamins with lunch but unfortunately Aciphex is given to her at the same time as levothyroxine, which has changed since last visit, when she was  taking it with lunchtime and dinnertime - no PPIs - +on Biotin and B complex  Reviewed her TFTs: Lab Results  Component Value Date   TSH 0.631 12/11/2019   TSH 0.94 02/17/2019   TSH 0.82 09/30/2018   TSH 0.52 10/08/2017   TSH 0.827 10/15/2016   Thyroid nodules.    Pt denies: - feeling nodules in neck - hoarseness - SOB with lying down But she continues to have chronic dysphagia with certain foods.  She had previous esophageal dilations. She also has choking.  I reviewed previous imaging and biopsy tests: FNA (2014) x2:  Benign  thyroid U/S (10/2013): Right thyroid lobe: 3.7 x 1.0 x 1.8 cm  Left thyroid lobe: 3.5 x 1.5 x 1.6 cm  Isthmus: 0.5 cm  Focal nodules:  There is a 0.3 mm calcified nodule in the right midzone.  There is a 0.6 x 0.7 x 0.5 cm hypoechoic nodule in the  lateral aspect of the right midzone.  There is a 0.7 x 0.6 x 0.5 cm hyperechoic nodule in the lateral aspect of the right lower pole.  There is a 2.2 x 1.1 x 1.7 cm complex solid nodule in the inferior aspect of the isthmus extending adjacent to the left lower pole.  There is a 1.4 x 2.1 x 1.4 cm complex solid nodule in the left lower pole.  Lymphadenopathy: None visualized.  Repeat U/S (01/2014): Stable appearance of the nodules  Repeat U/S (10/2015): stable appearance of the nodules  Repeat U/S (10/2016): Stable appearance of the nodules  She had a L rib fracture in 12/2014. She had a R ankle fracture 02/20/2020 >> bone stimulator to help with healing. Patient was admitted with AMS + sepsis on 10/15/2016.  She had aspiration pneumonia and acute respiratory failure She has a recurrent meningioma >> seeing Dr. Rita Ohara. She continues to have steroid injections in her left shoulder.  ROS: + see HPI Neurological: no tremors/+ numbness/+ tingling/no dizziness  I reviewed pt's medications, allergies, PMH, social hx, family hx, and changes were documented in the history of present illness. Otherwise, unchanged from my initial visit note.  Past Medical History:  Diagnosis Date   Allergy    Anemia    Anxiety    takes Ativan daily   Arthritis    Assistance needed for mobility    Bipolar disorder ([place])    takes Risperdal nightly   Blood transfusion    Brain tumor ([place])    Cancer ([place])    In her gum   Carpal tunnel syndrome of right wrist 05/23/2011   Cervical disc disorder with radiculopathy of cervical region 10/31/2012   Chronic back pain    Chronic idiopathic constipation    Chronic neck and back pain    Colon polyps     COPD (chronic obstructive pulmonary disease) with chronic bronchitis ([place]) 09/16/2013   Office Spirometry 10/30/2013-submaximal effort based on appearance of loop and curve. Numbers would fit with severe restriction but her physiologic capability may be better than this. FVC 0.91/44%, and 10.74/45%, FEV1/FVC 0.81, FEF 25-75% 1.43/69%     Diabetes mellitus    Type II   Diverticulosis    TCS 9/08 by Dr. Delfin Edis for diarrhea . Bx for micro scopic colitis negative.    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   Metabolic encephalopathy 8/93/7342   Migraines    chronic headaches   Mononeuritis lower limb    Narcolepsy    Osteoporosis    Pancreatitis 2006   due to Depakote with normal EUS    Schatzki's ring    non critical / EGD with ED 8/2011with RMR   Seizures ([place])    takes Lamictal daily.Last seizure 3 yrs ago   Sleep apnea    on CPAP   Small bowel obstruction ([facility])    Stroke ([place])    left sided weakness, speech changes   Tubular adenoma of colon    Past Surgical History:  Procedure Laterality Date   ABDOMINAL HYSTERECTOMY  1978   BACK SURGERY  July 2012   BACTERIAL OVERGROWTH TEST N/A 05/05/2013   Procedure: BACTERIAL OVERGROWTH TEST;  Surgeon: Daneil Dolin, MD;  [facility];  Service: Endoscopy;  Laterality: N/A;  7:30   BIOPSY THYROID  2009   BRAIN SURGERY  11/2011   resection of meningioma   BREAST REDUCTION SURGERY  1994   CARDIAC  CATHETERIZATION  05/10/2005   normal coronaries, normal LV systolic function and EF (Dr. Jackie Plum)   [place] Left 07/22/04   Dr. Aline Brochure   CATARACT EXTRACTION Bilateral    CHOLECYSTECTOMY  1984   COLONOSCOPY N/A 09/25/2012   AJG:OTLXBWI diverticulosis.  colonic polyp-removed : tubular adenoma   CRANIOTOMY  11/23/2011   Procedure: CRANIOTOMY TUMOR EXCISION;  Surgeon: Hosie Spangle, MD;  Location: [place] NEURO ORS;  Service: Neurosurgery;  Laterality: N/A;  Craniotomy for tumor resection   ESOPHAGOGASTRODUODENOSCOPY  12/29/2010   [place]-Retained food in the esophagus and stomach, small hiatal hernia, status post Maloney dilation of the esophagus   ESOPHAGOGASTRODUODENOSCOPY N/A 09/25/2012   OMB:TDHRCBUL atonic baggy esophagus status post Maloney dilation 66 F. Hiatal hernia   GIVENS CAPSULE STUDY N/A 01/15/2013   NORMAL.    IR GENERIC HISTORICAL  03/17/2016   IR RADIOLOGIST EVAL & MGMT 03/17/2016 [facility] RAD   LESION REMOVAL N/A 05/31/2015   Procedure: REMOVAL RIGHT AND LEFT LESIONS OF MANDIBLE;  Surgeon: Diona Browner, DDS;  Location: [place];  Service: Oral Surgery;  Laterality: N/A;   MALONEY DILATION  12/29/2010   RMR;   NM MYOCAR PERF WALL MOTION  2006   "relavtiely normal" persantine, mild anterior thinning (breast attenuation artifact), no region of scar/ischemia   OVARIAN CYST REMOVAL     RECTOCELE REPAIR N/A 06/29/2015   Procedure: POSTERIOR REPAIR (RECTOCELE);  Surgeon: Jonnie Kind, MD;  Location: [facility];  Service: Gynecology;  Laterality: N/A;   REDUCTION MAMMAPLASTY Bilateral    SPINE SURGERY  09/29/2010   Dr. Rolena Infante   surgical excision of 3 tumors from right thigh and right buttock  and left upper thigh  2010   TOOTH EXTRACTION Bilateral 12/14/2014   Procedure: REMOVAL OF BILATERAL MANDIBULAR EXOSTOSES;  Surgeon: Diona Browner, DDS;  Location: [place];  Service: Oral Surgery;  Laterality: Bilateral;   TRANSTHORACIC ECHOCARDIOGRAM  2010   EF 60-65%, mild conc LVH, grade 1  diastolic dysfunction; mildly  calcified MV annulus with mildly thickened leaflets, mildly calcified MR annulus   History   Social History   Marital Status: Divorced    Spouse Name: N/A    Number of Children: 1   Years of Education: 12   Occupational History   disabled     Social History Main Topics   Smoking status: Current Every Day Smoker -- 0.25 packs/day for 7 years    Types: Cigarettes   Smokeless tobacco: Never Used     Comment: "started back but off now for 3 months" (08/18/13)   Alcohol Use: No     Comment:     Drug Use: No   Current Outpatient Medications on File Prior to Visit  Medication Sig Dispense Refill   Accu-Chek Softclix Lancets lancets TEST BLOOD SUGAR THREE TIMES DAILY AS DIRECTED 300 each 2   albuterol (VENTOLIN HFA) 108 (90 Base) MCG/ACT inhaler INHALE 1 TO 2 PUFFS EVERY 6 HOURS AS NEEDED FOR WHEEZING, SHORTNESS OF BREATH 3 each 1   Alcohol Swabs (DROPSAFE ALCOHOL PREP) 70 % PADS USE TO CLEAN FINGER PRIOR TO STICKING FOR BLOOD SUGAR. 200 each 0   alendronate (FOSAMAX) 70 MG tablet TAKE 1 TABLET (70 MG TOTAL) BY MOUTH EVERY 7 (SEVEN) DAYS. TAKE WITH A FULL GLASS OF WATER ON AN EMPTY STOMACH. 12 tablet 3   amLODipine (NORVASC) 10 MG tablet TAKE 1 TABLET EVERY DAY (Patient taking differently: Take 10 mg by mouth daily.) 90 tablet 1   ascorbic acid (VITAMIN C) 500 MG tablet Take 500 mg by mouth daily.     aspirin EC 81 MG tablet Take 1 tablet (81 mg total) by mouth daily with breakfast. 120 tablet 2   betamethasone dipropionate 0.05 % cream Apply topically 2 (two) times daily as needed (Rash). 45 g 3   Blood Glucose Calibration (ACCU-CHEK GUIDE CONTROL) LIQD USE AS DIRECTED 1 each 0   Blood Glucose Calibration (ACCU-CHEK GUIDE CONTROL) LIQD USE AS NEEDED TO CALIBRATE GLUCOMETER 1 each 0   blood glucose meter kit and supplies Dispense based on patient and insurance preference. Use up to four times daily as directed. (FOR ICD-10 E10.9, E11.9). 1 each 0   buPROPion  (WELLBUTRIN XL) 150 MG 24 hr tablet Take 1 tablet (150 mg total) by mouth every morning. 90 tablet 2   cetirizine (ZYRTEC) 10 MG tablet Take 1 tablet (10 mg total) by mouth daily. 7 tablet 0   clobetasol (OLUX) 0.05 % topical foam Apply topically 2 (two) times daily. 50 g 0   Continuous Blood Gluc Sensor (FREESTYLE LIBRE 14 DAY SENSOR) MISC 1 each by Does not apply route every 14 (fourteen) days. Change every 2 weeks 2 each 11   diclofenac Sodium (VOLTAREN) 1 % GEL APPLY 2 GRAMS  TOPICALLY FOUR TIMES DAILY. (Patient taking differently: Apply 2 g topically 4 (four) times daily.) 700 g 1   DROPLET PEN NEEDLES 31G X 8 MM MISC USE FOR INJECTING INSULIN 4 TIMES DAILY. 400 each 0   ezetimibe (ZETIA) 10 MG tablet TAKE 1 TABLET EVERY DAY (Patient taking differently: Take 10 mg by mouth daily.) 90 tablet 1   famotidine (PEPCID) 40 MG tablet Take 40 mg by mouth 2 (two) times daily.     Fluticasone-Umeclidin-Vilant (TRELEGY ELLIPTA) 100-62.5-25 MCG/INH AEPB Inhale 1 puff into the lungs daily. 120 each 0   glucose blood (ACCU-CHEK GUIDE) test strip USE TO CHECK BLOOD SUGAR FOUR TIMES A DAY AND AS NEEDED 200 strip 0   hydrOXYzine (ATARAX/VISTARIL)  10 MG tablet Take 1 tablet (10 mg total) by mouth 3 (three) times daily as needed. (Patient taking differently: Take 10 mg by mouth 3 (three) times daily as needed for itching or anxiety.) 30 tablet 0   insulin aspart (NOVOLOG FLEXPEN) 100 UNIT/ML FlexPen INJECT 16 TO 18 UNITS UNDER SKIN UP TO 3 TIMES A DAY BEFORE MEALS  (DISCARD PEN 28 DAYS AFTER OPENING) (Patient taking differently: Inject 16-18 Units into the skin 3 (three) times daily with meals. (DISCARD PEN 28 DAYS AFTER OPENING)) 60 mL 0   lamoTRIgine (LAMICTAL) 100 MG tablet Take 1 tablet (100 mg total) by mouth 2 (two) times daily. 180 tablet 2   levothyroxine (SYNTHROID) 50 MCG tablet Take 1 tablet (50 mcg) 6 days 1/2 tablet (25 mcg) 1 day weekly. 80 tablet 3   LORazepam (ATIVAN) 0.5 MG tablet Take 1 tablet (0.5  mg total) by mouth 2 (two) times daily. 60 tablet 2   losartan (COZAAR) 100 MG tablet Take 1 tablet (100 mg total) by mouth daily. 30 tablet 2   meclizine (ANTIVERT) 25 MG tablet Take 1 tablet (25 mg total) by mouth 3 (three) times daily as needed for dizziness. 30 tablet 0   methocarbamol (ROBAXIN) 500 MG tablet Take one tablet by mouth two times daily , as needed, for neck and back pain and spasm (Patient taking differently: Take 500 mg by mouth 2 (two) times daily as needed for muscle spasms (for neck and back pain and spasm).) 20 tablet 0   metoprolol tartrate (LOPRESSOR) 50 MG tablet TAKE 1 TABLET TWICE DAILY (NEED MD APPOINTMENT) (Patient taking differently: Take 50 mg by mouth 2 (two) times daily.) 180 tablet 3   mirabegron ER (MYRBETRIQ) 25 MG TB24 tablet Take 1 tablet (25 mg total) by mouth daily. 30 tablet 2   montelukast (SINGULAIR) 10 MG tablet TAKE 1 TABLET EVERY DAY (Patient taking differently: Take 10 mg by mouth daily.) 90 tablet 1   nystatin (MYCOSTATIN/NYSTOP) powder APPLY TO AFFECTED AREA 4 TIMES DAILY. (Patient taking differently: Apply 1 application topically in the morning, at noon, in the evening, and at bedtime.) 90 g 1   Omega-3 Fatty Acids (FISH OIL PO) Take 1 capsule by mouth daily.     oxyCODONE-acetaminophen (PERCOCET) 10-325 MG tablet Take 1 tablet by mouth 3 (three) times daily as needed for pain.     Plecanatide (TRULANCE) 3 MG TABS Take 3 mg by mouth daily as needed. 30 tablet 0   pregabalin (LYRICA) 75 MG capsule Take 1 capsule (75 mg total) by mouth daily.     RABEprazole (ACIPHEX) 20 MG tablet TAKE 1 TABLET TWICE DAILY 180 tablet 0   RESTASIS 0.05 % ophthalmic emulsion Place 2 drops into both eyes daily.     risperiDONE (RISPERDAL) 0.5 MG tablet Take 1 tablet (0.5 mg total) by mouth at bedtime. 90 tablet 2   rosuvastatin (CRESTOR) 5 MG tablet TAKE 1 TABLET AT BEDTIME (Patient taking differently: Take 5 mg by mouth daily.) 90 tablet 2   sertraline (ZOLOFT) 100 MG  tablet Take 1 tablet (100 mg total) by mouth daily. 90 tablet 2   spironolactone (ALDACTONE) 25 MG tablet Take 1 tablet (25 mg total) by mouth daily. 30 tablet 2   STIOLTO RESPIMAT 2.5-2.5 MCG/ACT AERS INHALE 2 PUFFS INTO THE LUNGS DAILY. (Patient taking differently: Inhale 2 puffs into the lungs daily.) 12 g 1   TOUJEO MAX SOLOSTAR 300 UNIT/ML Solostar Pen INJECT 24 UNITS SUBCUTANEOUSLY INTO THE SKIN AT  BEDTIME (Patient taking differently: Inject 24 Units into the skin at bedtime.) 12 mL 3   traZODone (DESYREL) 100 MG tablet Take 1 tablet (100 mg total) by mouth at bedtime. 90 tablet 2   traZODone (DESYREL) 150 MG tablet Take 1 tablet (150 mg total) by mouth at bedtime. 90 tablet 2   triamcinolone (KENALOG) 0.1 % Apply 1 application topically daily. 15 g 1   UNABLE TO [facility] bed mattress x 1  DX: G47.33, J44.9 1 each 0   UNABLE TO FIND Standard wheelchair Dx M47.16, R29.898 1 each 0   UNABLE TO [facility] bed with firm mattress x 1 DX G47.33, G89.4 1 each 0   No current facility-administered medications on file prior to visit.      Allergies  Allergen Reactions   Cephalexin Hives   Iron Nausea And Vomiting   Milk-Related Compounds Other (See Comments)    Doesn't agree with stomach.    Penicillins Hives        Phenazopyridine Hcl Hives         Family History  Problem Relation Age of Onset   Heart attack Mother        HTN   Pneumonia Father    Kidney failure Father    Diabetes Father    Pancreatic cancer Sister    Cancer Sister        breast    Cancer Sister        pancreatic   Diabetes Brother    Hypertension Brother    Diabetes Brother    Alcohol abuse Maternal Uncle    Stroke Maternal Grandmother    Heart attack Maternal Grandfather    Hypertension Son    Sleep apnea Son    Colon cancer Neg Hx    Anesthesia problems Neg Hx    Hypotension Neg Hx    Malignant hyperthermia Neg Hx    Pseudochol deficiency Neg Hx    Breast cancer Neg Hx    Stomach  cancer Neg Hx    PE: BP 130/82 (BP Location: Left Arm, Patient Position: Sitting, Cuff Size: Normal)   Pulse 60   Ht 4' 11"  (1.499 m)   Wt 167 lb 3.2 oz (75.8 kg)   SpO2 96%   BMI 33.77 kg/m  Body mass index is 33.77 kg/m.  Wt Readings from Last 3 Encounters:  01/21/21 168 lb (76.2 kg)  01/17/21 167 lb 3.2 oz (75.8 kg)  12/30/20 169 lb 8.5 oz (76.9 kg)   Constitutional: overweight, in NAD Eyes: PERRLA, EOMI, no exophthalmos ENT: moist mucous membranes, no thyromegaly, no cervical lymphadenopathy Cardiovascular: RRR, No MRG Respiratory: CTA B Gastrointestinal: abdomen soft, NT, ND, BS+ Musculoskeletal: no deformities, strength intact in all 4 Skin: moist, warm, no rashes Neurological: no tremor with outstretched hands, DTR normal in all 4  ASSESSMENT: 1. DM2, insulin-dependent, uncontrolled, without complications - gastroparesis - cerebrovasc. Ds -  H/o stroke - PN  She was interested in an insulin pump >> discussed about [employer] (given brochure).  2. Hypothyroidism  3. MNG - Thyroid U/S (10/11/2012): Right thyroid lobe:  3.7 x 1.0 x 1.8 cm Left thyroid lobe:   3.5 x 1.5 x 1.6 cm Isthmus:  0.5 cm Focal nodules:  There is a 0.3 mm calcified nodule in the right midzone.  There is a 0.6 x 0.7 x 0.5 cm hypoechoic nodule in the lateral aspect of the right midzone.  There is a 0.7 x 0.6 x 0.5 cm hyperechoic nodule in the  lateral aspect of the right lower pole. There is a 2.2 x 1.1 x 1.7 cm complex solid nodule in the inferior aspect of the isthmus extending adjacent to the left lower pole. There is a 1.4 x 2.1 x 1.4 cm complex solid nodule in the left lower pole.  Lymphadenopathy:  None visualized.   IMPRESSION: Multi nodular goiter which has markedly progressed since the prior exam.  The dominant nodule at the inferior aspect of the isthmus fits criteria for fine needle aspiration biopsy if not previously Assessed.  - FNA (11/05/2012) x2: benign  - Thyroid U/S  (01/20/2014) - felt one nodule enlarging >> new U/S: stable appearance of the nodules, except a small new nodule in isthmus: 1.2 cm  Right thyroid lobe Measurements: 4.4 cm x 1.2 cm x 1.7 cm. Multiple right-sided nodules identified. Each right-sided nodule demonstrates increased echogenicity, with the superior measuring 7 mm -8 mm, most inferior measuring 9 mm - 10 mm. A small, 3 mm focus of calcium with posterior shadowing is evident. There is also a mid nodule measuring 7 mm, which is echogenic. Left thyroid lobe Measurements: 5.1 cm x 2.1 cm x 2.3 cm. Dominant lesion at the inferior pole of left thyroid is again evident, which has been previously biopsied (10/29/2012). This nodule measures 1.8 cm x 1.7cm x 2.5 cm. (Previous 1.4 cm x 2.1 cm x 1.4 cm) Isthmus Thickness: 4 mm-5 mm. Isthmic nodule again noted, previously biopsied (10/29/2012). Currently this nodule measures 2.2 cm x 1.4 cm x 1.8 cm. (previous measurement 2.2 cm x 1.1 cm x 1.7 cm). There is a new nodule identified within the isthmus with heterogeneously hyperechoic characteristics. This nodule measures 12 mm x 5.4 mm x 7.2 mm. Lymphadenopathy: None visualized.  - Thyroid U/S (11/09/2015): Details   Reading Physician Reading Date Result Priority  Sandi Mariscal, MD 11/09/2015   Narrative    COMPARISON:  Thyroid ultrasound - 01/20/2014 ; 10/11/2012  FINDINGS: Parenchymal Echotexture: Moderately heterogenous Estimated total number of nodules > 1 cm: <5 Number of spongiform nodules > 2 cm not described below (TR1): 0 Number of mixed cystic nodules > 1.5 cm not described below ([place]): 0 _____________________________________________________  Isthmus: 0.7 cm  Nodule # 1: Prior biopsy: No Location: Isthmus; left Size: 1.5 x 1.3 x 1.5 cm, previously 2.2 x 1.4 x 1.8 cm Composition: solid/almost completely solid (2) Echogenicity: hyperechoic (1) ACR TI-RADS total points: 3. Change in features: Yes. Increased internal cystic  degeneration and smaller in size. Change in ACR TI-RADS risk category: No  *Given size (1.5 - 2.4 cm) and appearance, a follow-up ultrasound in 1 year is recommended based on TI-RADS criteria as clinically indicated.  _________________________________________________________  Right lobe: 4.6 x 1.1 x 1.9 cm, previously 4.4 x 1.2 x 1.7 cm Stable scattered subcentimeter echogenic nodules _________________________________________________________  Left lobe: 4.8 x 2.1 x 1.9 cm, previously 5.1 x 2.1 x 2.3 cm Nodule # 1: Prior biopsy: No Location: Left; Inferior Size: 2.4 x 1.7 x 1.9 cm, previously 2.5 x 1.7 x 1.7 Composition: solid/almost completely solid (2) Echogenicity: hyperechoic (1) ACR TI-RADS total points: 3. ACR TI-RADS risk category: TR3 (3 points). Change in features: No Change in ACR TI-RADS risk category: No *Given size (1.5 - 2.4 cm) and appearance, a follow-up ultrasound in 1 year is recommended based on TI-RADS criteria as clinically indicated.  IMPRESSION: TR 3 isthmic and left thyroid nodules unchanged. *Given size (1.5 - 2.4 cm) and appearance of both thyroid nodules, a follow-up ultrasound in 1  year is recommended based on TI-RADS criteria as clinically indicated.  The above is in keeping with the ACR TI-RADS recommendations [facility] Coll Radiol 2017;14:[id number].        10/12/2016: Thyroid U/S: Parenchymal Echotexture: Moderately heterogenous Isthmus: 1.1 cm Right lobe: 3.8 x 1.0 x 1.7 cm Left lobe: 4.5 x 1.7 x 2.1 cm  ______________________________________________________   Estimated total number of nodules >/= 1 cm: 3  _____________________________________________   Diffusely heterogeneous multinodular thyroid gland. As seen previously, there are multiple small echogenic nodules scattered throughout the right gland which do not meet criteria for biopsy or follow-up. Head and lobular pyramidal lobe extends superiorly from the thyroid isthmus. No  interval change.   The previously biopsied nodule in the inferior aspect of the isthmus measures slightly smaller today at 1.5 x 1.0 x 1.4 cm compared to 1.7 x 1.3 x 1.5 cm previously.   The previously biopsied nodule in the left inferior gland is essentially unchanged at 2.6 x 1.3 x 1.5 cm compared to 2.4 x 1.8 x 1.9 cm. No new nodule or abnormality identified.   IMPRESSION: 1. Stable multinodular goiter with a prominent lobular parental lobe extending superiorly from the thyroid isthmus. 2. The previously biopsied nodule in the inferior isthmus measures slightly smaller on today's examination. 3. The previously biopsied nodule in the left inferior gland is stable. 4. Additional scattered small and benign-appearing nodules do not meet criteria for biopsy or continued follow-up. The above is in keeping with the ACR TI-RADS recommendations - J Am Coll Radiol 2017;14:[id number].   PLAN:  1. Patient with longstanding, uncontrolled, type 2 diabetes, on basal-bolus insulin regimen, with fluctuating CBGs but higher blood sugars whenever she is getting steroid injections.  Latest HbA1c was 5.8 in 07/2020.  This was higher than before.  However, her HbA1c levels directly measured by usually lower than expected from log.  That is why at last visit, I recommended a CGM.  She forgot about this... At today's visit I again explained how to obtain it and gave her a list of suppliers. -At last visit, sugars were fluctuating, with the majority of the sugars within the target range but occasionally in the 200s especially after steroid treatments.  We did not change her regimen but I did advise her to take more NovoLog during the time of steroid injections. -At this visit, sugars are still mostly at target with occasional higher blood sugars before meals, possibly due to snacks, but also possibly due to the fact that she is eating a strict dose of NovoLog with all meals.  I again advised her that for larger  meals or for meals after which she has dessert, she will need to take a higher dose of NovoLog, for example 18 units.  She agrees to do so.  No other changes needed for now. - I advised her to:  Patient Instructions  Please continue: - Toujeo 28 units daily - Novolog 16 units for smaller meals 18 units for large meals (20-22 units after steroid injections)  Please continue levothyroxine 50 mcg daily.  Take the thyroid hormone every day, with water, at least 30 minutes before breakfast, separated by at least 4 hours from: - acid reflux medications - calcium - iron - multivitamins  MOVE ACIPHEX AT LEAST 4 HOURS AFTER LEVOTHYROXINE.  The most common suppliers for the continuous glucose monitor are:  [employer]: [phone] Ext Axis: [place]: [place]: [phone] [place]: [phone]  [name] [employer]: [phone]  Please return in 4 months.  - we checked her HbA1c: 5.9% (lightly higher) - advised to check sugars at different times of the day - 4x a day, rotating check times - advised for yearly eye exams >> she is UTD - return to clinic in 4 months   2. Hypothyroidism - latest thyroid labs reviewed with pt. >> normal: Lab Results  Component Value Date   TSH 0.631 12/11/2019  - she continues on LT4 50 mcg daily - pt feels good on this dose. - we discussed about taking the thyroid hormone every day, with water, >30 minutes before breakfast, separated by >4 hours from acid reflux medications, calcium, iron, multivitamins. Pt. Is not taking it correctly now -upon questioning, Aciphex is given to her at the same time as levothyroxine.  I advised her to move it at least 4 hours later. - will check thyroid tests at next visit  3. MNG -She has a history of 2 benign thyroid nodules biopsied in 2014 -Reviewed the report of the thyroid ultrasound from 2014 -2018 and the nodules appeared  stable -No need to repeat the ultrasound unless she develops new neck compression symptoms -She does have chronic mild dysphagia.  No esophageal compression per barium swallow study from 08/2014.  She did not have esophageal dysmotility and had multiple esophageal dilations -Since she mentions that she is worried about her nodules and still has dysphagia, we discussed about checking another ultrasound.  If nodules are stable, no further intervention needed  Thyroid U/S (02/02/2021): Parenchymal Echotexture: Mildly heterogeneous Isthmus: 0.6 cm Right lobe: 4.4 x 0.8 x 1.7 cm  Left lobe: 4.5 x 2.1 x 3.6 cm  ______________________________________________________   Nodule # 1:  Prior biopsy: Yes-10/29/2012  Location: Isthmus; superior  Maximum size: 2.6 cm; Other 2 dimensions: 1.0 x 2.0 cm, previously, 1.5 x 1.0 x 1.4 cm  Composition: mixed cystic and solid (1)  Echogenicity: isoechoic (1) Significant change in size (>/= 20% in two dimensions and minimal increase of 2 mm): Yes (predominately due to increase in cystic component) Change in features: Yes Change in ACR TI-RADS risk category: Yes This nodule does NOT meet TI-RADS criteria for biopsy or dedicated follow-up.  _________________________________________________________   Nodule 2: 1.0 x 0.8 x 0.8 cm hypoechoic (actually hyperechoic) nodule in the inferior right thyroid lobe does (NOT) meet criteria for FNA or imaging surveillance. _________________________________________________________   There is diffuse heterogeneity of the left thyroid lobe without discrete nodules.   IMPRESSION: Previously biopsied isthmus nodule demonstrates interval development of cystic component. It does not meet criteria for repeat biopsy at this time. No new suspicious thyroid nodules.   The above is in keeping with the ACR TI-RADS recommendations - J Am Coll Radiol 2017;14:587-595.   Electronically Signed   By: Miachel Roux M.D.   On:  02/03/2021 08:17   This did not show appears to have increased in size, but this is actually due to the increased cystic component.  This nodule has been previously biopsied with benign results. Called and discussed with Dr.Mir with [facility] and he acknowledged that the changes in red above have been dictation errors and he will addend the report.  Philemon Kingdom, MD PhD [facility] Endocrinology

## 2021-01-18 ENCOUNTER — Ambulatory Visit (HOSPITAL_COMMUNITY): Payer: Medicare HMO | Admitting: Psychiatry

## 2021-01-19 ENCOUNTER — Other Ambulatory Visit: Payer: Self-pay

## 2021-01-19 NOTE — Patient Outreach (Signed)
[facility]) Care Management  01/19/2021  [name] June 01, 1950 [id number]   Telephone call to patient for disease management follow up.   No answer.  HIPAA compliant voice message left.    Plan: If no return call, RN CM will attempt patient again in February.    Jone Baseman, RN, MSN [place] [facility] Care Management Coordinator Direct Line [phone] Cell [phone] Toll Free: [phone]  Fax: [phone]

## 2021-01-20 DIAGNOSIS — E1142 Type 2 diabetes mellitus with diabetic polyneuropathy: Secondary | ICD-10-CM | POA: Diagnosis not present

## 2021-01-20 DIAGNOSIS — F17210 Nicotine dependence, cigarettes, uncomplicated: Secondary | ICD-10-CM | POA: Diagnosis not present

## 2021-01-20 DIAGNOSIS — J42 Unspecified chronic bronchitis: Secondary | ICD-10-CM | POA: Diagnosis not present

## 2021-01-20 DIAGNOSIS — M160 Bilateral primary osteoarthritis of hip: Secondary | ICD-10-CM | POA: Diagnosis not present

## 2021-01-20 DIAGNOSIS — Z5181 Encounter for therapeutic drug level monitoring: Secondary | ICD-10-CM | POA: Diagnosis not present

## 2021-01-20 DIAGNOSIS — J449 Chronic obstructive pulmonary disease, unspecified: Secondary | ICD-10-CM | POA: Diagnosis not present

## 2021-01-20 DIAGNOSIS — F250 Schizoaffective disorder, bipolar type: Secondary | ICD-10-CM | POA: Diagnosis not present

## 2021-01-20 DIAGNOSIS — D649 Anemia, unspecified: Secondary | ICD-10-CM | POA: Diagnosis not present

## 2021-01-20 DIAGNOSIS — M961 Postlaminectomy syndrome, not elsewhere classified: Secondary | ICD-10-CM | POA: Diagnosis not present

## 2021-01-20 DIAGNOSIS — E039 Hypothyroidism, unspecified: Secondary | ICD-10-CM | POA: Diagnosis not present

## 2021-01-20 DIAGNOSIS — R2681 Unsteadiness on feet: Secondary | ICD-10-CM | POA: Diagnosis not present

## 2021-01-20 DIAGNOSIS — Z79899 Other long term (current) drug therapy: Secondary | ICD-10-CM | POA: Diagnosis not present

## 2021-01-21 ENCOUNTER — Other Ambulatory Visit: Payer: Self-pay

## 2021-01-21 ENCOUNTER — Ambulatory Visit (INDEPENDENT_AMBULATORY_CARE_PROVIDER_SITE_OTHER): Payer: Medicare HMO | Admitting: Family Medicine

## 2021-01-21 ENCOUNTER — Encounter: Payer: Self-pay | Admitting: Family Medicine

## 2021-01-21 VITALS — BP 136/54 | HR 61 | Resp 16 | Ht 59.0 in | Wt 168.0 lb

## 2021-01-21 DIAGNOSIS — R0789 Other chest pain: Secondary | ICD-10-CM | POA: Diagnosis not present

## 2021-01-21 DIAGNOSIS — R0989 Other specified symptoms and signs involving the circulatory and respiratory systems: Secondary | ICD-10-CM | POA: Diagnosis not present

## 2021-01-21 DIAGNOSIS — F17210 Nicotine dependence, cigarettes, uncomplicated: Secondary | ICD-10-CM

## 2021-01-21 DIAGNOSIS — I10 Essential (primary) hypertension: Secondary | ICD-10-CM

## 2021-01-21 MED ORDER — NAPROXEN 375 MG PO TBEC: DELAYED_RELEASE_TABLET | ORAL | 0 refills | Status: DC

## 2021-01-21 MED ORDER — KETOROLAC TROMETHAMINE 60 MG/2ML IM SOLN
60.0000 mg | Freq: Once | INTRAMUSCULAR | Status: AC
  Administered 2021-01-21: 60 mg via INTRAMUSCULAR

## 2021-01-21 NOTE — Assessment & Plan Note (Signed)
toradol 30 mg IM, and naproxen for 5 days

## 2021-01-21 NOTE — Telephone Encounter (Signed)
Coming in today for visit

## 2021-01-21 NOTE — Progress Notes (Signed)
   [name]     MRN: [id number]      DOB: 29-Jan-1951   HPI [name] is here with a 1 week h/o right posterior rib pain  C/o loneliness and isolation Smokes intermittently Denies polyuria, polydipsia, blurred vision , or hypoglycemic episodes.  ROS Denies recent fever or chills. Denies sinus pressure, nasal congestion, ear pain or sore throat. Denies chest congestion, productive cough or wheezing. Denies  palpitations and leg swelling Denies abdominal pain, nausea, vomiting,diarrhea or constipation.   Denies dysuria, frequency, hesitancy or incontinence.   Denies skin break down or rash.   PE  BP (!) 136/54   Pulse 61   Resp 16   Ht 4\' 11"  (1.499 m)   Wt 168 lb (76.2 kg)   SpO2 92%   BMI 33.93 kg/m   Patient alert and oriented and in no cardiopulmonary distress.  HEENT: No facial asymmetry, EOMI,     Neck decreased ROM .  Chest: Clear to auscultation bilaterally.decreased air entry, tender over right posterior chest  CVS: S1, S2 no murmurs, no S3.Regular rate.  ABD: Soft non tender.   Ext: No edema  MS: Decreased  ROM spine, shoulders, hips and knees.  Skin: Intact, no ulcerations or rash noted.  Psych: Good eye contact, normal affect. Memory intact not anxious or depressed appearing.  CNS: CN 2-12 intact, power,  normal throughout.no focal deficits noted.   Assessment & Plan  Posterior chest pain toradol 30 mg IM, and naproxen for 5 days  Labile hypertension Controlled, no change in medication DASH diet and commitment to daily physical activity for a minimum of 30 minutes discussed and encouraged, as a part of hypertension management. The importance of attaining a healthy weight is also discussed.  BP/Weight 01/21/2021 01/17/2021 12/30/2020 12/23/2020 12/17/2020 12/08/2020 1/51/7616  Systolic BP 073 710 626 948 546 270 350  Diastolic BP 54 82 84 85 70 62 62  Wt. (Lbs) 168 167.2 169.53 171 168.8 168.12 165.6  BMI 33.93 33.77 34.24 34.54 34.09 33.96  33.45  Some encounter information is confidential and restricted. Go to Review Flowsheets activity to see all data.       Essential hypertension Controlled, no change in medication DASH diet and commitment to daily physical activity for a minimum of 30 minutes discussed and encouraged, as a part of hypertension management. The importance of attaining a healthy weight is also discussed.  BP/Weight 01/21/2021 01/17/2021 12/30/2020 12/23/2020 12/17/2020 12/08/2020 0/93/8182  Systolic BP 993 716 967 893 810 175 102  Diastolic BP 54 82 84 85 70 62 62  Wt. (Lbs) 168 167.2 169.53 171 168.8 168.12 165.6  BMI 33.93 33.77 34.24 34.54 34.09 33.96 33.45  Some encounter information is confidential and restricted. Go to Review Flowsheets activity to see all data.       Episodic cigarette smoking dependence Asked:confirms currently smokes cigarettes occasionally Assess: Unwilling to set a quit date, but is cutting back Advise: needs to QUIT to reduce risk of cancer, cardio and cerebrovascular disease Assist: counseled for 5 minutes and literature provided Arrange: follow up in 2 to 4 months

## 2021-01-21 NOTE — Patient Instructions (Addendum)
F/U as before , call if you need me sooner  Toradol 30 mg IM in the office and 1 week of naproxen is prescribed for posterior chest wall pain,   Thanks for choosing [facility], we consider it a privelige to serve you.

## 2021-01-23 ENCOUNTER — Encounter: Payer: Self-pay | Admitting: Family Medicine

## 2021-01-23 NOTE — Assessment & Plan Note (Signed)
Asked:confirms currently smokes cigarettes occasionally Assess: Unwilling to set a quit date, but is cutting back Advise: needs to QUIT to reduce risk of cancer, cardio and cerebrovascular disease Assist: counseled for 5 minutes and literature provided Arrange: follow up in 2 to 4 months

## 2021-01-23 NOTE — Assessment & Plan Note (Signed)
Controlled, no change in medication DASH diet and commitment to daily physical activity for a minimum of 30 minutes discussed and encouraged, as a part of hypertension management. The importance of attaining a healthy weight is also discussed.  BP/Weight 01/21/2021 01/17/2021 12/30/2020 12/23/2020 12/17/2020 12/08/2020 09/09/4763  Systolic BP 465 035 465 681 275 170 017  Diastolic BP 54 82 84 85 70 62 62  Wt. (Lbs) 168 167.2 169.53 171 168.8 168.12 165.6  BMI 33.93 33.77 34.24 34.54 34.09 33.96 33.45  Some encounter information is confidential and restricted. Go to Review Flowsheets activity to see all data.

## 2021-01-23 NOTE — Assessment & Plan Note (Signed)
Controlled, no change in medication DASH diet and commitment to daily physical activity for a minimum of 30 minutes discussed and encouraged, as a part of hypertension management. The importance of attaining a healthy weight is also discussed.  BP/Weight 01/21/2021 01/17/2021 12/30/2020 12/23/2020 12/17/2020 12/08/2020 5/92/9244  Systolic BP 628 638 177 116 579 038 333  Diastolic BP 54 82 84 85 70 62 62  Wt. (Lbs) 168 167.2 169.53 171 168.8 168.12 165.6  BMI 33.93 33.77 34.24 34.54 34.09 33.96 33.45  Some encounter information is confidential and restricted. Go to Review Flowsheets activity to see all data.

## 2021-01-24 ENCOUNTER — Telehealth: Payer: Self-pay | Admitting: Dermatology

## 2021-01-24 IMAGING — DX DG ABDOMEN 2V
2 series · 3 of 3 positions shown · non-contrast
Comparison: 12/23/2018

CLINICAL DATA: Left-sided abdominal pain and cramping. Nausea,
vomiting, and bloating for 2-3 months. Constipation.

EXAM:
ABDOMEN - 2 VIEW

[Series 1: abdomen erect · 0.14mm/px · 2 of 2 slices shown]
[im 1/2]
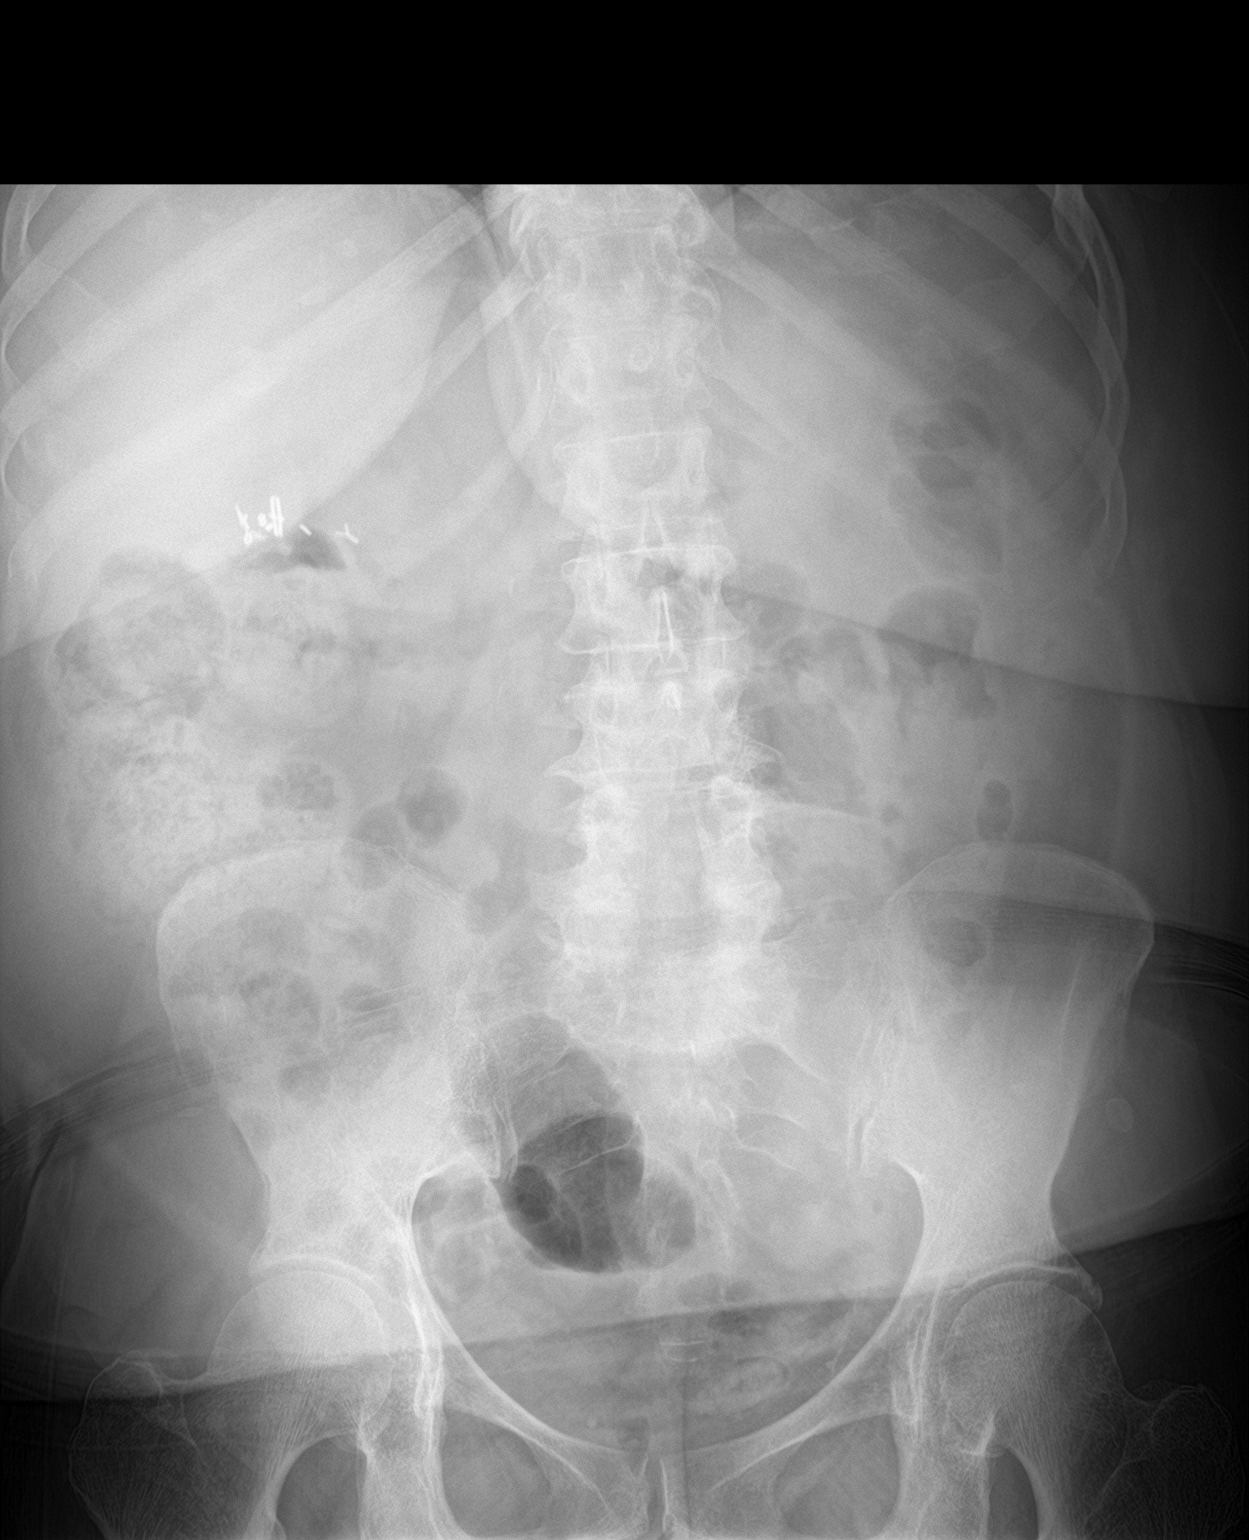
[im 2/2]
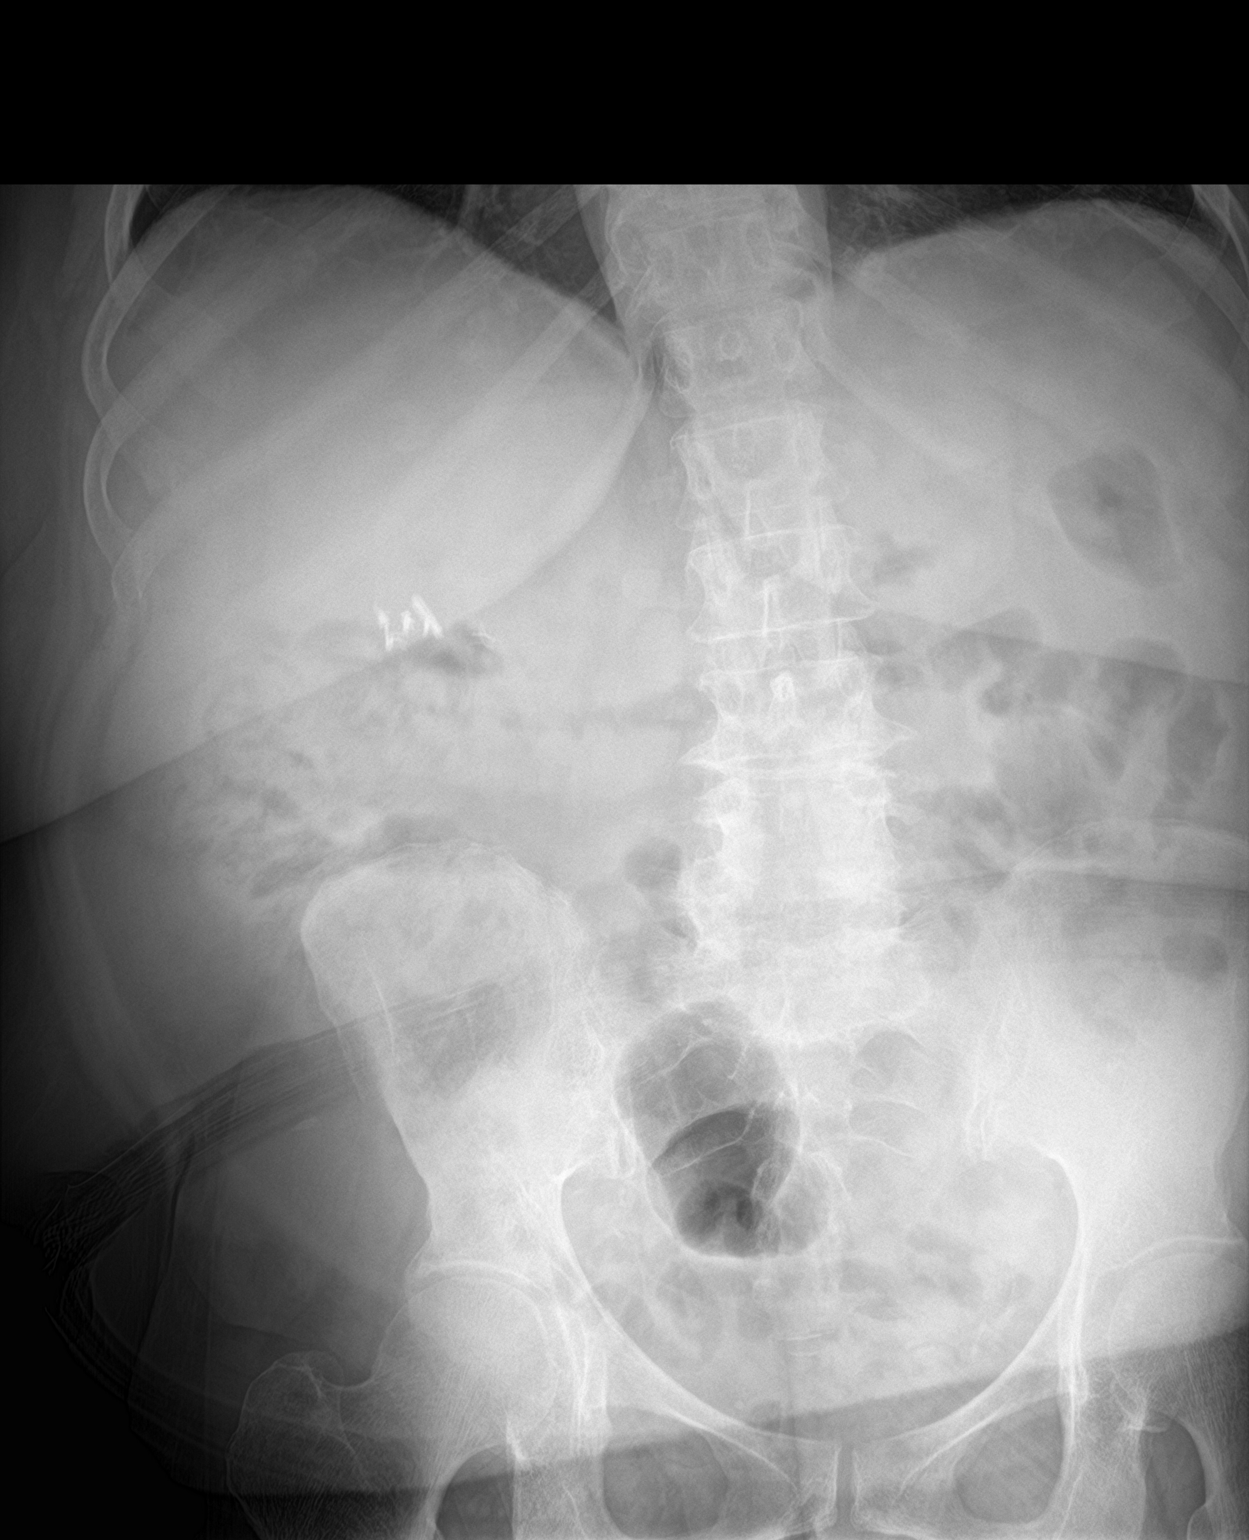

[abdomen supine]
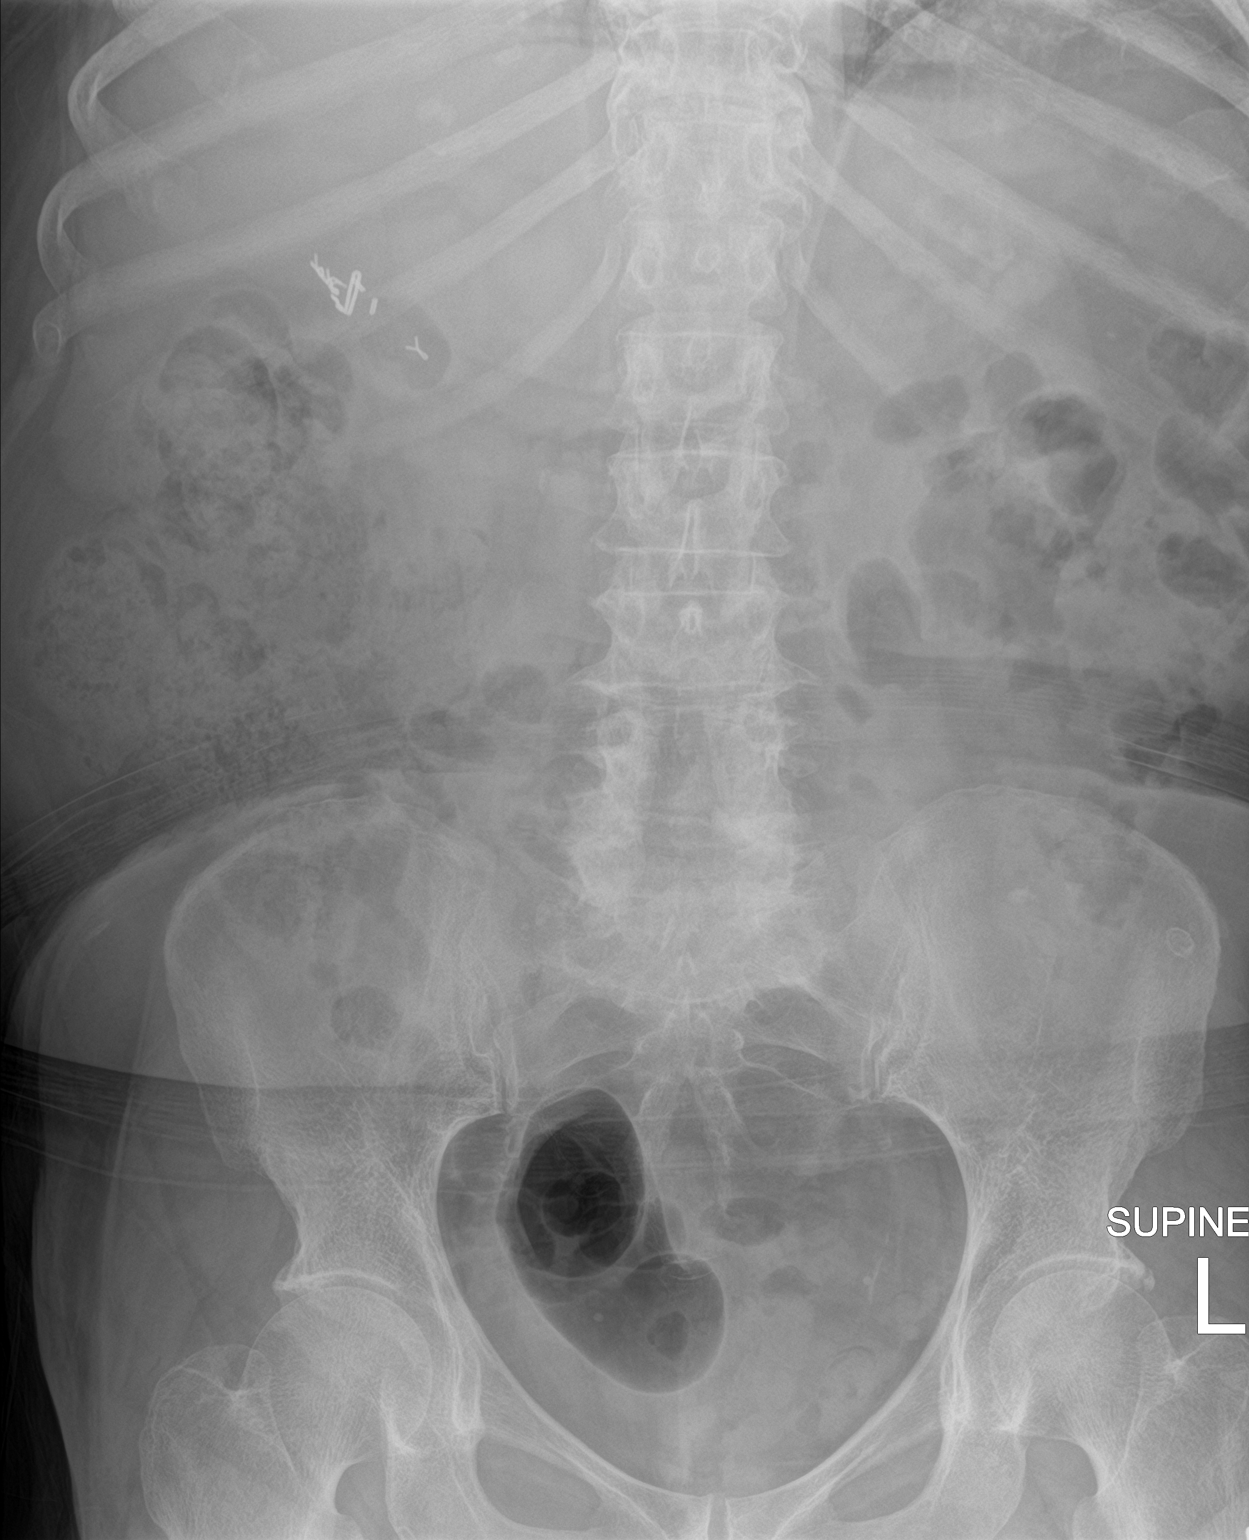

[3 of 3 positions shown; findings below may reference images not displayed]

FINDINGS: Bowel gas pattern is normal. No excessive stool in the colon. No
fecal impaction. No visible free air or free fluid. Surgical clips
in the right upper quadrant from previous cholecystectomy. No acute
bone abnormality. Previous lower lumbar surgery.
IMPRESSION: Benign-appearing abdomen.

## 2021-01-24 MED ORDER — CLOBETASOL PROP EMOLLIENT BASE 0.05 % EX CREA
TOPICAL_CREAM | CUTANEOUS | 6 refills | Status: DC
Start: 2021-01-24 — End: 2022-01-31

## 2021-01-24 NOTE — Telephone Encounter (Signed)
Patient calling to inform our office that clobetasol foam is not covered by her insurance. She was told that Clobetasol cream is covered and she would like Rx changed.

## 2021-01-24 NOTE — Telephone Encounter (Signed)
Called patient to let her know that we sent in for clobetasol cream instead of foam.

## 2021-01-26 ENCOUNTER — Other Ambulatory Visit: Payer: Self-pay | Admitting: Family Medicine

## 2021-01-31 ENCOUNTER — Other Ambulatory Visit: Payer: Self-pay

## 2021-01-31 ENCOUNTER — Ambulatory Visit (INDEPENDENT_AMBULATORY_CARE_PROVIDER_SITE_OTHER): Payer: Medicare HMO | Admitting: Psychiatry

## 2021-01-31 DIAGNOSIS — J449 Chronic obstructive pulmonary disease, unspecified: Secondary | ICD-10-CM | POA: Diagnosis not present

## 2021-01-31 DIAGNOSIS — E039 Hypothyroidism, unspecified: Secondary | ICD-10-CM | POA: Diagnosis not present

## 2021-01-31 DIAGNOSIS — F250 Schizoaffective disorder, bipolar type: Secondary | ICD-10-CM | POA: Diagnosis not present

## 2021-01-31 DIAGNOSIS — F17210 Nicotine dependence, cigarettes, uncomplicated: Secondary | ICD-10-CM | POA: Diagnosis not present

## 2021-01-31 DIAGNOSIS — E1142 Type 2 diabetes mellitus with diabetic polyneuropathy: Secondary | ICD-10-CM | POA: Diagnosis not present

## 2021-01-31 DIAGNOSIS — M160 Bilateral primary osteoarthritis of hip: Secondary | ICD-10-CM | POA: Diagnosis not present

## 2021-01-31 DIAGNOSIS — J42 Unspecified chronic bronchitis: Secondary | ICD-10-CM | POA: Diagnosis not present

## 2021-01-31 DIAGNOSIS — F331 Major depressive disorder, recurrent, moderate: Secondary | ICD-10-CM

## 2021-01-31 DIAGNOSIS — R2681 Unsteadiness on feet: Secondary | ICD-10-CM | POA: Diagnosis not present

## 2021-01-31 DIAGNOSIS — D649 Anemia, unspecified: Secondary | ICD-10-CM | POA: Diagnosis not present

## 2021-01-31 NOTE — Progress Notes (Signed)
Virtual Visit via Telephone Note  I connected with [name] on 01/31/21 at 4:15 PM EST  by telephone and verified that I am speaking with the correct person using two identifiers.  Location: Patient: Home Provider: [place] office    I discussed the limitations, risks, security and privacy concerns of performing an evaluation and management service by telephone and the availability of in person appointments. I also discussed with the patient that there may be a patient responsible charge related to this service. The patient expressed understanding and agreed to proceed.    I provided 40 minutes of non-face-to-face time during this encounter.   [name], LCSW              THERAPIST PROGRESS NOTE    Session Time: Monday 01/31/2021 4:15 PM - 4:55 PM   Participation Level: Active  Behavioral Response:  Less depressed, talkative, alert   Type of Therapy: Individual Therapy  Treatment Goals addressed: Patient wants to learn how to improve coping skills to manage chronic pain and health issues/ improve mood    Interventions: CBT and Supportive             Summary: [name] is a 70 y.o. female who presents with  long standing history of recurrent periods  of depression beginning when she was [age] and her favorite uncle died. Patient reports multiple psychiatric hospitlaizations due to depression and suicidal ideations with the last one occuring in 1997. Patient has participated in outpatient psychotherapy and medication management intermittently since age 70.  She currently is seeing psychiatrist Dr. Harrington Challenger . Prior to this, she was seen at [facility]. Patient also has had ECT at [facility]. Symptoms have worsened in recent months due to family stress and have  included depressed mood, anxiety, excessive worry, and tearfulness.         Patient's last contact was by virtual visit via telephone about 6 weeks ago.  She reports continued symptoms of depression as  well as continued efforts to use helpful coping strategies.  She continues to engage in such activities as reading, watching TV, attending church, and talking to friend on phone.  She also reports attending a women's group event at church this past Saturday patient is pleased with her use of coping strategies.  However she is experiencing more stress regarding her health.  Per patient's report, she recently was diagnosed with stage III kidney disease.  She expresses frustration regarding this.  She has questions for her medical providers and fears some information may not have been shared with her.  She suspects her situation may be more serious and that her health may be a more serious decline.   Suicidal/Homicidal: Nowithout intent/plan      Therapist Response: Reviewed symptoms, praised and reinforced patient's efforts to maintain involvement in activities, gust stressors, facilitated expression of thoughts and feelings, validated feelings, assisted patient examine her thought patterns/challenge/and replace with more helpful thought patterns, assisted patient identify ways to have assertive communication with medical providers regarding her concerns  Diagnosis: Axis I: MDD, Recurrent, moderate    Axis II: Deferred    Manasvini Whatley E Amaka Gluth, LCSW

## 2021-02-02 ENCOUNTER — Ambulatory Visit
Admission: RE | Admit: 2021-02-02 | Discharge: 2021-02-02 | Disposition: A | Discharge status: Home / Self Care | Payer: Medicare HMO | Source: Ambulatory Visit | Attending: Internal Medicine | Admitting: Internal Medicine

## 2021-02-02 DIAGNOSIS — E042 Nontoxic multinodular goiter: Secondary | ICD-10-CM

## 2021-02-02 DIAGNOSIS — E041 Nontoxic single thyroid nodule: Secondary | ICD-10-CM | POA: Diagnosis not present

## 2021-02-03 ENCOUNTER — Other Ambulatory Visit: Payer: Self-pay | Admitting: Family Medicine

## 2021-02-07 ENCOUNTER — Encounter: Payer: Medicare HMO | Admitting: Family Medicine

## 2021-02-08 ENCOUNTER — Encounter: Payer: Medicare HMO | Admitting: Family Medicine

## 2021-02-09 DIAGNOSIS — E785 Hyperlipidemia, unspecified: Secondary | ICD-10-CM

## 2021-02-09 DIAGNOSIS — K5900 Constipation, unspecified: Secondary | ICD-10-CM

## 2021-02-09 DIAGNOSIS — K5730 Diverticulosis of large intestine without perforation or abscess without bleeding: Secondary | ICD-10-CM

## 2021-02-09 DIAGNOSIS — F250 Schizoaffective disorder, bipolar type: Secondary | ICD-10-CM | POA: Diagnosis not present

## 2021-02-09 DIAGNOSIS — Z794 Long term (current) use of insulin: Secondary | ICD-10-CM

## 2021-02-09 DIAGNOSIS — E1142 Type 2 diabetes mellitus with diabetic polyneuropathy: Secondary | ICD-10-CM | POA: Diagnosis not present

## 2021-02-09 DIAGNOSIS — M160 Bilateral primary osteoarthritis of hip: Secondary | ICD-10-CM | POA: Diagnosis not present

## 2021-02-09 DIAGNOSIS — F17210 Nicotine dependence, cigarettes, uncomplicated: Secondary | ICD-10-CM | POA: Diagnosis not present

## 2021-02-09 DIAGNOSIS — K589 Irritable bowel syndrome without diarrhea: Secondary | ICD-10-CM

## 2021-02-09 DIAGNOSIS — D649 Anemia, unspecified: Secondary | ICD-10-CM | POA: Diagnosis not present

## 2021-02-09 DIAGNOSIS — R2681 Unsteadiness on feet: Secondary | ICD-10-CM | POA: Diagnosis not present

## 2021-02-09 DIAGNOSIS — I10 Essential (primary) hypertension: Secondary | ICD-10-CM

## 2021-02-09 DIAGNOSIS — K219 Gastro-esophageal reflux disease without esophagitis: Secondary | ICD-10-CM

## 2021-02-09 DIAGNOSIS — Z7982 Long term (current) use of aspirin: Secondary | ICD-10-CM

## 2021-02-09 DIAGNOSIS — E039 Hypothyroidism, unspecified: Secondary | ICD-10-CM | POA: Diagnosis not present

## 2021-02-09 DIAGNOSIS — R531 Weakness: Secondary | ICD-10-CM

## 2021-02-09 DIAGNOSIS — M810 Age-related osteoporosis without current pathological fracture: Secondary | ICD-10-CM

## 2021-02-09 DIAGNOSIS — R296 Repeated falls: Secondary | ICD-10-CM

## 2021-02-09 DIAGNOSIS — Z79899 Other long term (current) drug therapy: Secondary | ICD-10-CM

## 2021-02-09 DIAGNOSIS — F419 Anxiety disorder, unspecified: Secondary | ICD-10-CM

## 2021-02-09 DIAGNOSIS — J449 Chronic obstructive pulmonary disease, unspecified: Secondary | ICD-10-CM | POA: Diagnosis not present

## 2021-02-09 DIAGNOSIS — J42 Unspecified chronic bronchitis: Secondary | ICD-10-CM | POA: Diagnosis not present

## 2021-02-15 ENCOUNTER — Encounter (HOSPITAL_COMMUNITY): Payer: Self-pay | Admitting: Internal Medicine

## 2021-02-15 DIAGNOSIS — J449 Chronic obstructive pulmonary disease, unspecified: Secondary | ICD-10-CM | POA: Diagnosis not present

## 2021-02-15 DIAGNOSIS — D649 Anemia, unspecified: Secondary | ICD-10-CM | POA: Diagnosis not present

## 2021-02-15 DIAGNOSIS — E1142 Type 2 diabetes mellitus with diabetic polyneuropathy: Secondary | ICD-10-CM | POA: Diagnosis not present

## 2021-02-15 DIAGNOSIS — E039 Hypothyroidism, unspecified: Secondary | ICD-10-CM | POA: Diagnosis not present

## 2021-02-15 DIAGNOSIS — J42 Unspecified chronic bronchitis: Secondary | ICD-10-CM | POA: Diagnosis not present

## 2021-02-15 DIAGNOSIS — R2681 Unsteadiness on feet: Secondary | ICD-10-CM | POA: Diagnosis not present

## 2021-02-15 DIAGNOSIS — M160 Bilateral primary osteoarthritis of hip: Secondary | ICD-10-CM | POA: Diagnosis not present

## 2021-02-15 DIAGNOSIS — F250 Schizoaffective disorder, bipolar type: Secondary | ICD-10-CM | POA: Diagnosis not present

## 2021-02-15 DIAGNOSIS — F17210 Nicotine dependence, cigarettes, uncomplicated: Secondary | ICD-10-CM | POA: Diagnosis not present

## 2021-02-16 ENCOUNTER — Ambulatory Visit (INDEPENDENT_AMBULATORY_CARE_PROVIDER_SITE_OTHER): Payer: Medicare HMO | Admitting: Podiatry

## 2021-02-16 ENCOUNTER — Ambulatory Visit: Payer: Medicare HMO | Admitting: Podiatry

## 2021-02-16 ENCOUNTER — Other Ambulatory Visit: Payer: Self-pay

## 2021-02-16 DIAGNOSIS — B351 Tinea unguium: Secondary | ICD-10-CM

## 2021-02-16 DIAGNOSIS — E119 Type 2 diabetes mellitus without complications: Secondary | ICD-10-CM

## 2021-02-16 DIAGNOSIS — M79676 Pain in unspecified toe(s): Secondary | ICD-10-CM | POA: Diagnosis not present

## 2021-02-16 DIAGNOSIS — E1142 Type 2 diabetes mellitus with diabetic polyneuropathy: Secondary | ICD-10-CM

## 2021-02-21 ENCOUNTER — Ambulatory Visit: Payer: Medicare HMO | Admitting: Podiatry

## 2021-02-21 ENCOUNTER — Telehealth: Payer: Self-pay

## 2021-02-21 NOTE — Telephone Encounter (Signed)
Patient called new RX for firm mattress for her hospital bed at [place].

## 2021-02-21 NOTE — Telephone Encounter (Signed)
Please call [name] about her meds.

## 2021-02-21 NOTE — Telephone Encounter (Signed)
Duplicate message, pt wants firm mattress for hospital bed through [facility].

## 2021-02-22 ENCOUNTER — Encounter: Payer: Self-pay | Admitting: Podiatry

## 2021-02-22 ENCOUNTER — Other Ambulatory Visit: Payer: Self-pay

## 2021-02-22 MED ORDER — MATTRESS PAD MISC: 0 refills | Status: DC

## 2021-02-22 NOTE — Progress Notes (Signed)
Subjective: [name] is a 70 y.o. female patient seen today fpr at risk foot care with h/o diabetic neuropathy. She is seen for follow up of  painful thick toenails that are difficult to trim. Pain interferes with ambulation. Aggravating factors include wearing enclosed shoe gear. Pain is relieved with periodic professional debridement.  Patient is also here to have a yearly foot exam.   She states her A1c is 5.9 and is well controlled. PCP is [name], MD. Last visit was: 10/15/2020.  New problems reported today: None.  Allergies  Allergen Reactions   Lac Bovis Rash    Doesn't agree with stomach.    Phenazopyridine Hcl Hives   Cephalexin Hives   Flonase [Fluticasone]     "It gave me ulcers in my nose"   Iron Nausea And Vomiting   Milk-Related Compounds Other (See Comments)    Doesn't agree with stomach.    Penicillins Hives    Has patient had a PCN reaction causing immediate rash, facial/tongue/throat swelling, SOB or lightheadedness with hypotension: Yes Has patient had a PCN reaction causing severe rash involving mucus membranes or skin necrosis: No Has patient had a PCN reaction that required hospitalization No Has patient had a PCN reaction occurring within the last 10 years: No If all of the above answers are "NO", then may proceed with Cephalosporin use.    Phenazopyridine Hives   Phenazopyridine Hcl Hives          PCP is [name], MD .  Objective: Physical Exam  General: Patient is a pleasant 70 y.o. African American female in NAD. AAO x 3.   Neurovascular Examination: Capillary refill time to digits immediate b/l. Faintly palpable pedal pulses b/l LE. Pedal hair sparse b/l.  Lower extremity skin temperature gradient within normal limits. No edema noted b/l lower extremities.   Protective sensation intact diminished bilaterally with 10g monofilament b/l.  Dermatological:  Skin warm and supple b/l lower extremities. No open wounds b/l lower  extremities. No interdigital macerations b/l lower extremities. Toenails 1-5 b/l elongated, discolored, dystrophic, thickened, crumbly with subungual debris and tenderness to dorsal palpation.  Musculoskeletal:  Normal muscle strength 5/5 to all lower extremity muscle groups bilaterally. No pain crepitus or joint limitation noted with ROM b/l lower extremities. HAV with bunion deformity b/l.  Assessment: 1. Pain due to onychomycosis of toenail   2. Diabetic polyneuropathy associated with type 2 diabetes mellitus ([place])   3. Encounter for diabetic foot exam [facility])    Patient is a low risk for developing ulcerations or wound complication.  Plan: -No new findings. No new orders. -Continue diabetic foot care principles: inspect feet daily, monitor glucose as recommended by PCP and/or Endocrinologist, and follow prescribed diet per PCP, Endocrinologist and/or dietician. -Patient to continue soft, supportive shoe gear daily. -Toenails 1-5 b/l were debrided in length and girth with sterile nail nippers and dremel without iatrogenic bleeding.  -Patient to report any pedal injuries to medical professional immediately. -Patient/POA to call should there be question/concern in the interim.  Return in about 3 months (around 05/17/2021).  Felipa Furnace, DPM

## 2021-02-22 NOTE — Telephone Encounter (Signed)
Rx sent to [facility]

## 2021-02-23 ENCOUNTER — Other Ambulatory Visit: Payer: Self-pay

## 2021-02-23 ENCOUNTER — Telehealth: Payer: Self-pay

## 2021-02-23 ENCOUNTER — Encounter: Payer: Medicare HMO | Admitting: Family Medicine

## 2021-02-23 DIAGNOSIS — G4733 Obstructive sleep apnea (adult) (pediatric): Secondary | ICD-10-CM

## 2021-02-23 NOTE — Telephone Encounter (Signed)
Patient called said the hospital bed mattress was the same type, she now needs a prescription for a gel overlay.    Fax to [place]

## 2021-02-23 NOTE — Telephone Encounter (Signed)
faxed

## 2021-02-28 ENCOUNTER — Other Ambulatory Visit: Payer: Self-pay

## 2021-02-28 ENCOUNTER — Ambulatory Visit (INDEPENDENT_AMBULATORY_CARE_PROVIDER_SITE_OTHER): Payer: Medicare HMO | Admitting: Psychiatry

## 2021-02-28 DIAGNOSIS — J449 Chronic obstructive pulmonary disease, unspecified: Secondary | ICD-10-CM | POA: Diagnosis not present

## 2021-02-28 DIAGNOSIS — D649 Anemia, unspecified: Secondary | ICD-10-CM | POA: Diagnosis not present

## 2021-02-28 DIAGNOSIS — R2681 Unsteadiness on feet: Secondary | ICD-10-CM | POA: Diagnosis not present

## 2021-02-28 DIAGNOSIS — F331 Major depressive disorder, recurrent, moderate: Secondary | ICD-10-CM | POA: Diagnosis not present

## 2021-02-28 DIAGNOSIS — M160 Bilateral primary osteoarthritis of hip: Secondary | ICD-10-CM | POA: Diagnosis not present

## 2021-02-28 DIAGNOSIS — E1142 Type 2 diabetes mellitus with diabetic polyneuropathy: Secondary | ICD-10-CM | POA: Diagnosis not present

## 2021-02-28 DIAGNOSIS — F250 Schizoaffective disorder, bipolar type: Secondary | ICD-10-CM | POA: Diagnosis not present

## 2021-02-28 DIAGNOSIS — E039 Hypothyroidism, unspecified: Secondary | ICD-10-CM | POA: Diagnosis not present

## 2021-02-28 DIAGNOSIS — F17210 Nicotine dependence, cigarettes, uncomplicated: Secondary | ICD-10-CM | POA: Diagnosis not present

## 2021-02-28 DIAGNOSIS — J42 Unspecified chronic bronchitis: Secondary | ICD-10-CM | POA: Diagnosis not present

## 2021-02-28 NOTE — Progress Notes (Signed)
Virtual Visit via Telephone Note  I connected with [name] on 02/28/21 at 4:05 PM EST  by telephone and verified that I am speaking with the correct person using two identifiers.  Location: Patient: Home Provider: [place] office    I discussed the limitations, risks, security and privacy concerns of performing an evaluation and management service by telephone and the availability of in person appointments. I also discussed with the patient that there may be a patient responsible charge related to this service. The patient expressed understanding and agreed to proceed.   I provided 49 minutes of non-face-to-face time during this encounter.   [name], LCSW               THERAPIST PROGRESS NOTE    Session Time: Monday 02/28/2021 4:06 PM - 4:55 PM   Participation Level: Active  Behavioral Response:  Less depressed, talkative, alert   Type of Therapy: Individual Therapy  Treatment Goals addressed: Patient wants to learn how to improve coping skills to manage chronic pain and health issues/ improve mood    Interventions: CBT and Supportive             Summary: [name] is a 70 y.o. female who presents with  long standing history of recurrent periods  of depression beginning when she was [age] and her favorite uncle died. Patient reports multiple psychiatric hospitlaizations due to depression and suicidal ideations with the last one occuring in 1997. Patient has participated in outpatient psychotherapy and medication management intermittently since age 37.  She currently is seeing psychiatrist Dr. Harrington Challenger . Prior to this, she was seen at [facility]. Patient also has had ECT at [facility]. Symptoms have worsened in recent months due to family stress and have  included depressed mood, anxiety, excessive worry, and tearfulness.         Patient's last contact was by virtual visit via telephone about 4 weeks ago.  She reports continued symptoms of depression but  states having good days and bad days.  She has continued efforts to use helpful coping strategies.  She continues to engage in such activities as reading, watching TV, attending church, and talking to friends on phone.  She also reports attending another women's group event at church.  She also reports attending a church Christmas fellowship.  She reports enjoying contributing to the event by purchasing a platter.  She also reports getting decorative balls and filling it with candy for the children.  Patient reports she now has a new aide and they have been working together for about 2 weeks.  She reports this is going very well.  She reports going out a lot more.  The aide has taken her shopping as well as taking her out to eat.  Patient reports celebrating Thanksgiving at home alone and reports sometimes feeling depressed when at home alone.  She is looking forward to her granddaughter visiting her on Christmas.  She expresses less worry about her health.    Suicidal/Homicidal: Nowithout intent/plan      Therapist Response: Reviewed symptoms, praised and reinforced patient's efforts to maintain involvement in activities, discussed effects on her mood/thoughts/behaviors, assisted patient identify ways to cope with loneliness when home alone, praised and reinforced patient's use of assertiveness skills to express concerns to medical providers   Diagnosis: Axis I: MDD, Recurrent, moderate    Axis II: Deferred    Meesha Sek E Jadin Kagel, LCSW

## 2021-03-01 ENCOUNTER — Other Ambulatory Visit: Payer: Self-pay | Admitting: Internal Medicine

## 2021-03-04 ENCOUNTER — Telehealth: Payer: Self-pay | Admitting: *Deleted

## 2021-03-04 ENCOUNTER — Other Ambulatory Visit: Payer: Self-pay

## 2021-03-04 ENCOUNTER — Ambulatory Visit (INDEPENDENT_AMBULATORY_CARE_PROVIDER_SITE_OTHER): Payer: Medicare HMO | Admitting: Nurse Practitioner

## 2021-03-04 ENCOUNTER — Encounter: Payer: Self-pay | Admitting: Nurse Practitioner

## 2021-03-04 VITALS — BP 147/71 | HR 75 | Ht 59.0 in | Wt 171.1 lb

## 2021-03-04 DIAGNOSIS — Z79899 Other long term (current) drug therapy: Secondary | ICD-10-CM | POA: Diagnosis not present

## 2021-03-04 DIAGNOSIS — N1831 Chronic kidney disease, stage 3a: Secondary | ICD-10-CM

## 2021-03-04 NOTE — Progress Notes (Signed)
Acute Office Visit  Subjective:    Patient ID: [name], female    DOB: 11-12-1950, 70 y.o.   MRN: [id number]  Chief Complaint  Patient presents with   Discuss Labs    Pt would like to take less medication and see a nephrologist    HPI Patient is in today for physical exam. She had labs completed 12/23/20.  She states that she still has some pain at the top of her head where she fell 2-3 months ago.   Pt states her kidney function has been lower recently, and she would like to see nephrology.  Pt didn't bring her medications into her appointment today for medication reconciliation. She is at substantial risk for polypharmacy given she is on over 40 medications. She is unsure what she is and isn't taking.   Past Medical History:  Diagnosis Date   Allergy    Anemia    Anxiety    takes Ativan daily   Arthritis    Assistance needed for mobility    Bipolar disorder ([place])    takes Risperdal nightly   Blood transfusion    Brain tumor ([place])    Cancer ([place])    In her gum   Carpal tunnel syndrome of right wrist 05/23/2011   Cervical disc disorder with radiculopathy of cervical region 10/31/2012   Chronic back pain    Chronic idiopathic constipation    Chronic neck and back pain    Colon polyps    COPD (chronic obstructive pulmonary disease) with chronic bronchitis ([place]) 09/16/2013   Office Spirometry 10/30/2013-submaximal effort based on appearance of loop and curve. Numbers would fit with severe restriction but her physiologic capability may be better than this. FVC 0.91/44%, and 10.74/45%, FEV1/FVC 0.81, FEF 25-75% 1.43/69%     Diabetes mellitus    Type II   Diverticulosis    TCS 9/08 by Dr. Delfin Edis for diarrhea . Bx for micro scopic colitis negative.    Fibromyalgia    Frequent falls    GERD (gastroesophageal reflux disease)    takes Aciphex daily   Glaucoma    eye drops daily   Gum symptoms    infection on antibiotic   Hemiplegia affecting non-dominant side,  post-stroke 08/02/2011   Hiatal hernia    Hyperlipidemia    takes Crestor daily   Hypertension    takes Amlodipine,Metoprolol,and Clonidine daily   Hypothyroidism    takes Synthroid daily   IBS (irritable bowel syndrome)    Insomnia    takes Trazodone nightly   Major depression, recurrent ([place])    takes Zoloft daily   Malignant hyperpyrexia 1/36/4383   Metabolic encephalopathy 7/79/3968   Migraines    chronic headaches   Mononeuritis lower limb    Narcolepsy    Osteoporosis    Pancreatitis 2006   due to Depakote with normal EUS    Schatzki's ring    non critical / EGD with ED 8/2011with RMR   Seizures ([place])    takes Lamictal daily.Last seizure 3 yrs ago   Sleep apnea    on CPAP   Small bowel obstruction (HCC)    Stroke ([place])    left sided weakness, speech changes   Tubular adenoma of colon     Past Surgical History:  Procedure Laterality Date   ABDOMINAL HYSTERECTOMY  1978   BACK SURGERY  July 2012   BACTERIAL OVERGROWTH TEST N/A 05/05/2013   Procedure: BACTERIAL OVERGROWTH TEST;  Surgeon: Daneil Dolin, MD;  [facility]  [facility];  Service: Endoscopy;  Laterality: N/A;  7:30   BIOPSY THYROID  2009   BRAIN SURGERY  11/2011   resection of meningioma   BREAST REDUCTION SURGERY  1994   CARDIAC CATHETERIZATION  05/10/2005   normal coronaries, normal LV systolic function and EF (Dr. Jackie Plum)   [place] Left 07/22/04   Dr. Aline Brochure   CATARACT EXTRACTION Bilateral    CHOLECYSTECTOMY  1984   COLONOSCOPY N/A 09/25/2012   GBT:DVVOHYW diverticulosis.  colonic polyp-removed : tubular adenoma   CRANIOTOMY  11/23/2011   Procedure: CRANIOTOMY TUMOR EXCISION;  Surgeon: Hosie Spangle, MD;  Location: [facility];  Service: Neurosurgery;  Laterality: N/A;  Craniotomy for tumor resection   ESOPHAGOGASTRODUODENOSCOPY  12/29/2010   [place]-Retained food in the esophagus and stomach, small hiatal hernia, status post Maloney dilation of the esophagus    ESOPHAGOGASTRODUODENOSCOPY N/A 09/25/2012   VPX:TGGYIRSW atonic baggy esophagus status post Maloney dilation 53 F. Hiatal hernia   GIVENS CAPSULE STUDY N/A 01/15/2013   NORMAL.    IR GENERIC HISTORICAL  03/17/2016   IR RADIOLOGIST EVAL & MGMT 03/17/2016 [facility]   LESION REMOVAL N/A 05/31/2015   Procedure: REMOVAL RIGHT AND LEFT LESIONS OF MANDIBLE;  Surgeon: Diona Browner, DDS;  Location: [facility];  Service: Oral Surgery;  Laterality: N/A;   MALONEY DILATION  12/29/2010   RMR;   NM MYOCAR PERF WALL MOTION  2006   "relavtiely normal" persantine, mild anterior thinning (breast attenuation artifact), no region of scar/ischemia   OVARIAN CYST REMOVAL     RECTOCELE REPAIR N/A 06/29/2015   Procedure: POSTERIOR REPAIR (RECTOCELE);  Surgeon: Jonnie Kind, MD;  Location: [facility];  Service: Gynecology;  Laterality: N/A;   REDUCTION MAMMAPLASTY Bilateral    SPINE SURGERY  09/29/2010   Dr. Rolena Infante   surgical excision of 3 tumors from right thigh and right buttock  and left upper thigh  2010   TOOTH EXTRACTION Bilateral 12/14/2014   Procedure: REMOVAL OF BILATERAL MANDIBULAR EXOSTOSES;  Surgeon: Diona Browner, DDS;  Location: [facility];  Service: Oral Surgery;  Laterality: Bilateral;   TRANSTHORACIC ECHOCARDIOGRAM  2010   EF 60-65%, mild conc LVH, grade 1 diastolic dysfunction; mildly calcified MV annulus with mildly thickened leaflets, mildly calcified MR annulus    Family History  Problem Relation Age of Onset   Heart attack Mother        HTN   Pneumonia Father    Kidney failure Father    Diabetes Father    Pancreatic cancer Sister    Cancer Sister        breast    Cancer Sister        pancreatic   Diabetes Brother    Hypertension Brother    Diabetes Brother    Alcohol abuse Maternal Uncle    Stroke Maternal Grandmother    Heart attack Maternal Grandfather    Hypertension Son    Sleep apnea Son    Colon cancer Neg Hx    Anesthesia problems Neg Hx    Hypotension Neg Hx    Malignant  hyperthermia Neg Hx    Pseudochol deficiency Neg Hx    Breast cancer Neg Hx    Stomach cancer Neg Hx     Social History   Socioeconomic History   Marital status: Divorced    Spouse name: Not on file   Number of children: 1   Years of education: 12   Highest education level: High school graduate  Occupational History   Occupation: Disabled  Tobacco Use   Smoking status: Some Days    Years: 30.00    Types: Cigarettes   Smokeless tobacco: Never   Tobacco comments:    quit 7 weeks ago-07/14/2020-[facility]   Vaping Use   Vaping Use: Never used  Substance and Sexual Activity   Alcohol use: No    Alcohol/week: 0.0 standard drinks    Comment:     Drug use: No   Sexual activity: Not Currently  Other Topics Concern   Not on file  Social History Narrative   01/29/18 Lives alone, has 3 aides, Mon- Fri 8 hrs, 2 hrs on Sat-Sun, RN manages her meds   Caffeine use: Drink coffee sometimes    Right handed    Social Determinants of Radio broadcast assistant Strain: Low Risk    Difficulty of Paying Living Expenses: Not very hard  Food Insecurity: No Food Insecurity   Worried About Charity fundraiser in the Last Year: Never true   Ran Out of Food in the Last Year: Never true  Transportation Needs: No Transportation Needs   Lack of Transportation (Medical): No   Lack of Transportation (Non-Medical): No  Physical Activity: Inactive   Days of Exercise per Week: 0 days   Minutes of Exercise per Session: 0 min  Stress: Not on file  Social Connections: Moderately Isolated   Frequency of Communication with Friends and Family: Three times a week   Frequency of Social Gatherings with Friends and Family: Twice a week   Attends Religious Services: More than 4 times per year   Active Member of [employer] or Organizations: No   Attends Music therapist: Never   Marital Status: Divorced  Human resources officer Violence: Not on file    Outpatient Medications Prior to Visit  Medication Sig  Dispense Refill   Accu-Chek Softclix Lancets lancets TEST BLOOD SUGAR THREE TIMES DAILY AS DIRECTED 300 each 2   albuterol (VENTOLIN HFA) 108 (90 Base) MCG/ACT inhaler INHALE 1 TO 2 PUFFS EVERY 6 HOURS AS NEEDED FOR WHEEZING, SHORTNESS OF BREATH 3 each 1   Alcohol Swabs (DROPSAFE ALCOHOL PREP) 70 % PADS USE TO CLEAN FINGER PRIOR TO STICKING FOR BLOOD SUGAR. 200 each 0   alendronate (FOSAMAX) 70 MG tablet TAKE 1 TABLET (70 MG TOTAL) BY MOUTH EVERY 7 (SEVEN) DAYS. TAKE WITH A FULL GLASS OF WATER ON AN EMPTY STOMACH. 12 tablet 3   amLODipine (NORVASC) 10 MG tablet TAKE 1 TABLET EVERY DAY (Patient taking differently: Take 10 mg by mouth daily.) 90 tablet 1   ascorbic acid (VITAMIN C) 500 MG tablet Take 500 mg by mouth daily.     aspirin EC 81 MG tablet Take 1 tablet (81 mg total) by mouth daily with breakfast. 120 tablet 2   betamethasone dipropionate 0.05 % cream Apply topically 2 (two) times daily as needed (Rash). 45 g 3   Blood Glucose Calibration (ACCU-CHEK GUIDE CONTROL) LIQD USE AS DIRECTED 1 each 0   blood glucose meter kit and supplies Dispense based on patient and insurance preference. Use up to four times daily as directed. (FOR ICD-10 E10.9, E11.9). 1 each 0   buPROPion (WELLBUTRIN XL) 150 MG 24 hr tablet Take 1 tablet (150 mg total) by mouth every morning. 90 tablet 2   cetirizine (ZYRTEC) 10 MG tablet Take 1 tablet (10 mg total) by mouth daily. 7 tablet 0   clobetasol (OLUX) 0.05 % topical foam Apply topically 2 (two)  times daily. 50 g 0   Clobetasol Prop Emollient Base (CLOBETASOL PROPIONATE E) 0.05 % emollient cream Apply to affected area qd 60 g 6   Continuous Blood Gluc Sensor (FREESTYLE LIBRE 14 DAY SENSOR) MISC 1 each by Does not apply route every 14 (fourteen) days. Change every 2 weeks 2 each 11   diclofenac Sodium (VOLTAREN) 1 % GEL APPLY 2 GRAMS  TOPICALLY FOUR TIMES DAILY. (Patient taking differently: Apply 2 g topically 4 (four) times daily.) 700 g 1   DROPLET PEN NEEDLES 31G X  8 MM MISC USE FOR INJECTING INSULIN 4 TIMES DAILY. 400 each 0   ezetimibe (ZETIA) 10 MG tablet TAKE 1 TABLET EVERY DAY (Patient taking differently: Take 10 mg by mouth daily.) 90 tablet 1   famotidine (PEPCID) 40 MG tablet Take 40 mg by mouth 2 (two) times daily.     Fluticasone-Umeclidin-Vilant (TRELEGY ELLIPTA) 100-62.5-25 MCG/INH AEPB Inhale 1 puff into the lungs daily. 120 each 0   hydrOXYzine (ATARAX/VISTARIL) 10 MG tablet Take 1 tablet (10 mg total) by mouth 3 (three) times daily as needed. (Patient taking differently: Take 10 mg by mouth 3 (three) times daily as needed for itching or anxiety.) 30 tablet 0   insulin aspart (NOVOLOG FLEXPEN) 100 UNIT/ML FlexPen INJECT 16 TO 18 UNITS UNDER SKIN UP TO 3 TIMES A DAY BEFORE MEALS  (DISCARD PEN 28 DAYS AFTER OPENING) (Patient taking differently: Inject 16-18 Units into the skin 3 (three) times daily with meals. (DISCARD PEN 28 DAYS AFTER OPENING)) 60 mL 0   insulin glargine, 2 Unit Dial, (TOUJEO MAX SOLOSTAR) 300 UNIT/ML Solostar Pen Inject 24 Units into the skin at bedtime. 9 mL 1   lamoTRIgine (LAMICTAL) 100 MG tablet Take 1 tablet (100 mg total) by mouth 2 (two) times daily. 180 tablet 2   levothyroxine (SYNTHROID) 50 MCG tablet Take 1 tablet (50 mcg) 6 days 1/2 tablet (25 mcg) 1 day weekly. 80 tablet 3   LORazepam (ATIVAN) 0.5 MG tablet Take 1 tablet (0.5 mg total) by mouth 2 (two) times daily. 60 tablet 2   losartan (COZAAR) 100 MG tablet Take 1 tablet (100 mg total) by mouth daily. 30 tablet 2   meclizine (ANTIVERT) 25 MG tablet Take 1 tablet (25 mg total) by mouth 3 (three) times daily as needed for dizziness. 30 tablet 0   methocarbamol (ROBAXIN) 500 MG tablet Take one tablet by mouth two times daily , as needed, for neck and back pain and spasm (Patient taking differently: Take 500 mg by mouth 2 (two) times daily as needed for muscle spasms (for neck and back pain and spasm).) 20 tablet 0   metoprolol tartrate (LOPRESSOR) 50 MG tablet TAKE 1  TABLET TWICE DAILY (NEED MD APPOINTMENT) (Patient taking differently: Take 50 mg by mouth 2 (two) times daily.) 180 tablet 3   mirabegron ER (MYRBETRIQ) 25 MG TB24 tablet Take 1 tablet (25 mg total) by mouth daily. 30 tablet 2   Misc. Devices (MATTRESS PAD) MISC FIRM MATTRESS PAD for hospital bed x 1 1 each 0   montelukast (SINGULAIR) 10 MG tablet TAKE 1 TABLET EVERY DAY (Patient taking differently: Take 10 mg by mouth daily.) 90 tablet 1   Naproxen 375 MG TBEC Take one tablet by mouth once daily for 7 days , then stop 7 tablet 0   nystatin (MYCOSTATIN/NYSTOP) powder APPLY TO AFFECTED AREA 4 TIMES DAILY. (Patient taking differently: Apply 1 application topically in the morning, at noon, in the evening, and at  bedtime.) 90 g 1   Omega-3 Fatty Acids (FISH OIL PO) Take 1 capsule by mouth daily.     oxyCODONE-acetaminophen (PERCOCET) 10-325 MG tablet Take 1 tablet by mouth 3 (three) times daily as needed for pain.     Plecanatide (TRULANCE) 3 MG TABS Take 3 mg by mouth daily as needed. 30 tablet 0   pregabalin (LYRICA) 75 MG capsule Take 1 capsule (75 mg total) by mouth daily.     RABEprazole (ACIPHEX) 20 MG tablet TAKE 1 TABLET TWICE DAILY 180 tablet 0   RESTASIS 0.05 % ophthalmic emulsion Place 2 drops into both eyes daily.     risperiDONE (RISPERDAL) 0.5 MG tablet Take 1 tablet (0.5 mg total) by mouth at bedtime. 90 tablet 2   rosuvastatin (CRESTOR) 5 MG tablet TAKE 1 TABLET AT BEDTIME 90 tablet 2   sertraline (ZOLOFT) 100 MG tablet Take 1 tablet (100 mg total) by mouth daily. 90 tablet 2   spironolactone (ALDACTONE) 25 MG tablet Take 1 tablet (25 mg total) by mouth daily. 30 tablet 2   STIOLTO RESPIMAT 2.5-2.5 MCG/ACT AERS INHALE 2 PUFFS INTO THE LUNGS DAILY. (Patient taking differently: Inhale 2 puffs into the lungs daily.) 12 g 1   traZODone (DESYREL) 150 MG tablet Take 1 tablet (150 mg total) by mouth at bedtime. 90 tablet 2   triamcinolone (KENALOG) 0.1 % Apply 1 application topically daily. 15  g 1   [facility] bed mattress x 1  DX: G47.33, J44.9 1 each 0   UNABLE TO FIND Standard wheelchair Dx M47.16, R29.898 1 each 0   UNABLE TO [facility] bed with firm mattress x 1 DX G47.33, G89.4 1 each 0   Blood Glucose Calibration (ACCU-CHEK GUIDE CONTROL) LIQD USE AS NEEDED TO CALIBRATE GLUCOMETER 1 each 0   glucose blood (ACCU-CHEK GUIDE) test strip USE TO CHECK BLOOD SUGAR FOUR TIMES A DAY AND AS NEEDED 200 strip 0   traZODone (DESYREL) 100 MG tablet Take 1 tablet (100 mg total) by mouth at bedtime. 90 tablet 2   No facility-administered medications prior to visit.    Allergies  Allergen Reactions   Lac Bovis Rash    Doesn't agree with stomach.    Phenazopyridine Hcl Hives   Cephalexin Hives   Flonase [Fluticasone]     "It gave me ulcers in my nose"   Iron Nausea And Vomiting   Milk-Related Compounds Other (See Comments)    Doesn't agree with stomach.    Penicillins Hives    Has patient had a PCN reaction causing immediate rash, facial/tongue/throat swelling, SOB or lightheadedness with hypotension: Yes Has patient had a PCN reaction causing severe rash involving mucus membranes or skin necrosis: No Has patient had a PCN reaction that required hospitalization No Has patient had a PCN reaction occurring within the last 10 years: No If all of the above answers are "NO", then may proceed with Cephalosporin use.    Phenazopyridine Hives   Phenazopyridine Hcl Hives          Review of Systems  Constitutional: Negative.   Respiratory: Negative.    Cardiovascular: Negative.   Neurological:        Pain at the top of her head where she fell 2-3 months ago  Psychiatric/Behavioral:  Negative for self-injury and suicidal ideas.       Objective:    Physical Exam Constitutional:      Appearance: Normal appearance.  HENT:     Head: Normocephalic and atraumatic.  Cardiovascular:     Rate and Rhythm: Normal rate and regular rhythm.     Pulses: Normal pulses.      Heart sounds: Normal heart sounds.  Pulmonary:     Effort: Pulmonary effort is normal.     Breath sounds: Normal breath sounds.  Musculoskeletal:     Comments: Uses walker for ambulation  Neurological:     Mental Status: She is alert.    BP (!) 147/71    Pulse 75    Ht 4' 11"  (1.499 m)    Wt 171 lb 1.3 oz (77.6 kg)    SpO2 91%    BMI 34.55 kg/m  Wt Readings from Last 3 Encounters:  03/04/21 171 lb 1.3 oz (77.6 kg)  01/21/21 168 lb (76.2 kg)  01/17/21 167 lb 3.2 oz (75.8 kg)    Health Maintenance Due  Topic Date Due   COVID-19 Vaccine (4 - Booster for Moderna series) 04/12/2020    There are no preventive care reminders to display for this patient.   Lab Results  Component Value Date   TSH 0.631 12/11/2019   Lab Results  Component Value Date   WBC 5.6 12/23/2020   HGB 10.5 (L) 12/23/2020   HCT 36.5 12/23/2020   MCV 79.7 (L) 12/23/2020   PLT 254 12/23/2020   Lab Results  Component Value Date   NA 142 12/23/2020   K 4.4 12/23/2020   CO2 25 12/23/2020   GLUCOSE 110 (H) 12/23/2020   BUN 29 (H) 12/23/2020   CREATININE 1.23 (H) 12/23/2020   BILITOT 0.6 12/23/2020   ALKPHOS 78 12/23/2020   AST 20 12/23/2020   ALT 24 12/23/2020   PROT 7.1 12/23/2020   ALBUMIN 3.8 12/23/2020   CALCIUM 9.3 12/23/2020   ANIONGAP 5 12/23/2020   EGFR 62 11/18/2020   GFR 21.94 (L) 11/03/2014   Lab Results  Component Value Date   CHOL 128 09/01/2020   Lab Results  Component Value Date   HDL 40 09/01/2020   Lab Results  Component Value Date   LDLCALC 51 09/01/2020   Lab Results  Component Value Date   TRIG 232 (H) 09/01/2020   Lab Results  Component Value Date   CHOLHDL 3.2 09/01/2020   Lab Results  Component Value Date   HGBA1C 5.9 (A) 01/17/2021       Assessment & Plan:   Problem List Items Addressed This Visit       Other   Polypharmacy (Chronic)    -referral to clinical pharmacy; pt has over 40 active meds and had recent falls -she didn't bring her  medications with her today, and she may not be taking all of the medicines on her current list      Relevant Orders   AMB Referral to [place]   Other Visit Diagnoses     Stage 3a chronic kidney disease ([place])    -  Primary   Relevant Orders   Ambulatory referral to Nephrology        No orders of the defined types were placed in this encounter.    Noreene Larsson, NP

## 2021-03-04 NOTE — Assessment & Plan Note (Signed)
-  referral to clinical pharmacy; pt has over 40 active meds and had recent falls -she didn't bring her medications with her today, and she may not be taking all of the medicines on her current list

## 2021-03-04 NOTE — Chronic Care Management (AMB) (Signed)
Chronic Care Management   Note  03/04/2021 Name: SHAWNTAY PREST MRN: [id number] DOB: 10-04-1950  Rebbeca Paul Hepworth is a 70 y.o. year old female who is a primary care patient of Moshe Cipro Norwood Levo, MD. I reached out to Rebbeca Paul Reppert by phone today in response to a referral sent by Ms. Rebbeca Paul Batts's PCP.  Ms. Basso was given information about Chronic Care Management services today including:  CCM service includes personalized support from designated clinical staff supervised by her physician, including individualized plan of care and coordination with other care providers 24/7 contact phone numbers for assistance for urgent and routine care needs. Service will only be billed when office clinical staff spend 20 minutes or more in a month to coordinate care. Only one practitioner may furnish and bill the service in a calendar month. The patient may stop CCM services at any time (effective at the end of the month) by phone call to the office staff. The patient is responsible for co-pay (up to 20% after annual deductible is met) if co-pay is required by the individual health plan.   Patient agreed to services and verbal consent obtained.   Follow up plan: Telephone appointment with care management team member scheduled for: 03/24/2021  Julian Hy, [place] [facility]  Direct Dial: [phone]

## 2021-03-08 ENCOUNTER — Encounter: Payer: Medicare HMO | Admitting: Nurse Practitioner

## 2021-03-09 ENCOUNTER — Other Ambulatory Visit: Payer: Self-pay

## 2021-03-09 MED ORDER — MIRABEGRON ER 25 MG PO TB24: 25.0000 mg | ORAL_TABLET | Freq: Every day | ORAL | 2 refills | Status: DC

## 2021-03-15 ENCOUNTER — Ambulatory Visit (INDEPENDENT_AMBULATORY_CARE_PROVIDER_SITE_OTHER): Payer: Medicare HMO | Admitting: Psychiatry

## 2021-03-15 ENCOUNTER — Other Ambulatory Visit: Payer: Self-pay

## 2021-03-15 DIAGNOSIS — J42 Unspecified chronic bronchitis: Secondary | ICD-10-CM | POA: Diagnosis not present

## 2021-03-15 DIAGNOSIS — M160 Bilateral primary osteoarthritis of hip: Secondary | ICD-10-CM | POA: Diagnosis not present

## 2021-03-15 DIAGNOSIS — F17210 Nicotine dependence, cigarettes, uncomplicated: Secondary | ICD-10-CM | POA: Diagnosis not present

## 2021-03-15 DIAGNOSIS — R2681 Unsteadiness on feet: Secondary | ICD-10-CM | POA: Diagnosis not present

## 2021-03-15 DIAGNOSIS — F331 Major depressive disorder, recurrent, moderate: Secondary | ICD-10-CM | POA: Diagnosis not present

## 2021-03-15 DIAGNOSIS — E039 Hypothyroidism, unspecified: Secondary | ICD-10-CM | POA: Diagnosis not present

## 2021-03-15 DIAGNOSIS — F250 Schizoaffective disorder, bipolar type: Secondary | ICD-10-CM | POA: Diagnosis not present

## 2021-03-15 DIAGNOSIS — D649 Anemia, unspecified: Secondary | ICD-10-CM | POA: Diagnosis not present

## 2021-03-15 DIAGNOSIS — E1142 Type 2 diabetes mellitus with diabetic polyneuropathy: Secondary | ICD-10-CM | POA: Diagnosis not present

## 2021-03-15 DIAGNOSIS — J449 Chronic obstructive pulmonary disease, unspecified: Secondary | ICD-10-CM | POA: Diagnosis not present

## 2021-03-15 NOTE — Progress Notes (Signed)
Virtual Visit via Telephone Note  I connected with [name] on 03/15/21 at 4:06 PM EST by telephone and verified that I am speaking with the correct person using two identifiers.  Location: Patient: Home Provider: [place] office    I discussed the limitations, risks, security and privacy concerns of performing an evaluation and management service by telephone and the availability of in person appointments. I also discussed with the patient that there may be a patient responsible charge related to this service. The patient expressed understanding and agreed to proceed.    I provided 44 minutes of non-face-to-face time during this encounter.   [name], LCSW               THERAPIST PROGRESS NOTE    Session Time: Tuesday  03/15/2021 4:06 PM - 4:50 PM   Participation Level: Active  Behavioral Response:  Less depressed, talkative, alert   Type of Therapy: Individual Therapy  Treatment Goals addressed: Patient wants to learn how to improve coping skills to manage chronic pain and health issues/ improve mood    Interventions: CBT and Supportive             Summary: [name] is a 71 y.o. female who presents with  long standing history of recurrent periods  of depression beginning when she was [age] and her favorite uncle died. Patient reports multiple psychiatric hospitlaizations due to depression and suicidal ideations with the last one occuring in 1997. Patient has participated in outpatient psychotherapy and medication management intermittently since age 29.  She currently is seeing psychiatrist Dr. Harrington Challenger . Prior to this, she was seen at [facility]. Patient also has had ECT at [facility]. Symptoms have worsened in recent months due to family stress and have  included depressed mood, anxiety, excessive worry, and tearfulness.         Patient's last contact was by virtual visit via telephone about 3-4 weeks ago.  She reports continuing having good days and  bad days.  Overall, she reports having more good days.  She reports continued involvement in activities including attending church.  She reports continued strong support and positive relationship with her new A.  Patient reports a takes her out frequently to do errands and to just get out of the house.  Reports enjoying celebrating Christmas eve with her son and granddaughter.  She reports being home alone on Christmas day.  Per her report, she made and implemented plans to celebrate the day by cooking dinner and connecting with all friends via phone.  Patient reports increased interest in improving her self-care.  She states wanting to go to the [facility] 1 time a week.  She also plans to see a dietitian to help her improve her diet.  Patient also reports planning to resume reading.  She still expresses concerns about her health.  She has made a list of items to discuss with her providers.   Suicidal/Homicidal: Nowithout intent/plan      Therapist Response: Reviewed symptoms, praised and reinforced patient's efforts to maintain involvement in activities and be proactive, discussed effects on her mood/thoughts/behaviors, discussed stressors, facilitated expression of thoughts and feelings, validated feelings, encouraged patient to follow through with her plans regarding contacting [employer]/dietitian/and talking with medical providers regarding her concerns   Diagnosis: Axis I: MDD, Recurrent, moderate    Axis II: Deferred    Raed Schalk E Ruairi Stutsman, LCSW

## 2021-03-17 ENCOUNTER — Other Ambulatory Visit: Payer: Self-pay | Admitting: Radiation Therapy

## 2021-03-17 ENCOUNTER — Other Ambulatory Visit: Payer: Self-pay

## 2021-03-17 MED ORDER — MIRABEGRON ER 25 MG PO TB24: 25.0000 mg | ORAL_TABLET | Freq: Every day | ORAL | 2 refills | Status: DC

## 2021-03-18 NOTE — Telephone Encounter (Signed)
Patient is having trouble finding a firmer mattress for her hospital bed.  She asked if nurse could help her find company that she can get a firmer mattress for her back pain.

## 2021-03-21 ENCOUNTER — Other Ambulatory Visit: Payer: Self-pay

## 2021-03-21 MED ORDER — MIRABEGRON ER 25 MG PO TB24: 25.0000 mg | ORAL_TABLET | Freq: Every day | ORAL | 1 refills | Status: DC

## 2021-03-22 ENCOUNTER — Telehealth: Payer: Self-pay

## 2021-03-22 NOTE — Telephone Encounter (Signed)
Patient called left voice mail asked if nurse give her a call back concerns her kidney doctor.

## 2021-03-23 ENCOUNTER — Other Ambulatory Visit: Payer: Self-pay

## 2021-03-23 ENCOUNTER — Telehealth: Payer: Self-pay

## 2021-03-23 DIAGNOSIS — N1831 Chronic kidney disease, stage 3a: Secondary | ICD-10-CM

## 2021-03-23 MED ORDER — UNABLE TO FIND: 0 refills | Status: DC

## 2021-03-23 NOTE — Telephone Encounter (Signed)
Patient called asking if Dr Moshe Cipro can send the referral to the kidney doctor.

## 2021-03-23 NOTE — Telephone Encounter (Signed)
Patient called her insurance [employer] and the lady told her to contact her PCP to send in the prescription for hosptial bed and orthopedic mattress to [facility].

## 2021-03-23 NOTE — Telephone Encounter (Signed)
faxed

## 2021-03-23 NOTE — Telephone Encounter (Signed)
Referral send from [facility] to kidney dr

## 2021-03-24 ENCOUNTER — Ambulatory Visit (INDEPENDENT_AMBULATORY_CARE_PROVIDER_SITE_OTHER): Payer: Medicare HMO | Admitting: Pharmacist

## 2021-03-24 DIAGNOSIS — Z79899 Other long term (current) drug therapy: Secondary | ICD-10-CM

## 2021-03-24 DIAGNOSIS — N1831 Chronic kidney disease, stage 3a: Secondary | ICD-10-CM

## 2021-03-24 DIAGNOSIS — F060 Psychotic disorder with hallucinations due to known physiological condition: Secondary | ICD-10-CM

## 2021-03-24 DIAGNOSIS — E1143 Type 2 diabetes mellitus with diabetic autonomic (poly)neuropathy: Secondary | ICD-10-CM

## 2021-03-24 DIAGNOSIS — R0989 Other specified symptoms and signs involving the circulatory and respiratory systems: Secondary | ICD-10-CM

## 2021-03-24 DIAGNOSIS — E785 Hyperlipidemia, unspecified: Secondary | ICD-10-CM

## 2021-03-24 NOTE — Chronic Care Management (AMB) (Signed)
Chronic Care Management Pharmacy Note  03/24/2021 Name:  RHODA WALDVOGEL MRN:  [id number] DOB:  1950-09-22  Summary: Medication reconciliation: Difficult to obtain since patient report she does not handle her medications. She reports that she has a home health nurse come to her house every other Monday to fill her pill box.  She handles her refills alone and tries to set up everything on automatic refill but has a hard time managing all of her medications  Type 2 Diabetes Follows with [name] Hypoglycemia prevention:  recommend that endocrinology consider prescribing Current glucose readings: patient reports checking finger stick blood glucose four times daily. Unsure of many readings lately other than reports fasting blood glucose 115-150.  Per endocrinology note, may be discussing insulin pump and continuous glucose monitor Continue current medications as above per endocrinology. Need to make sure she understands how many units to inject. May not need such tight glycemic control based on other health factors  Hypertension Taking medications as directed:  questionable adherence to spironolactone. Patient unable to confirm that she was taking this with me today Side effects thought to be attributed to current medication regimen: yes, patient reports her home health nurse has her taking all of her blood pressure medications in the morning and patient feels like this is too much all at one time and often makes her dizzy and prone to fall. Recommend administration of losartan and amlodipine in the evening or at bedtime. Take spironolactone every morning. Take metoprolol every morning and every evening.   Chronic Kidney Disease Stage 3a - GFR 45-59 (Mild to Moderately Reduced Function) Referred to Dr. Theador Hawthorne but has not been seen for initial evaluation yet  Subjective: Gayla Benn Haran is an 71 y.o. year old female who is a primary patient of Moshe Cipro, Norwood Levo, MD.  The CCM team was  consulted for assistance with disease management and care coordination needs.    Engaged with patient by telephone for initial visit in response to provider referral for pharmacy case management and/or care coordination services.   Consent to Services:  The patient was given the following information about Chronic Care Management services today, agreed to services, and gave verbal consent: 1. CCM service includes personalized support from designated clinical staff supervised by the primary care provider, including individualized plan of care and coordination with other care providers 2. 24/7 contact phone numbers for assistance for urgent and routine care needs. 3. Service will only be billed when office clinical staff spend 20 minutes or more in a month to coordinate care. 4. Only one practitioner may furnish and bill the service in a calendar month. 5.The patient may stop CCM services at any time (effective at the end of the month) by phone call to the office staff. 6. The patient will be responsible for cost sharing (co-pay) of up to 20% of the service fee (after annual deductible is met). Patient agreed to services and consent obtained.  Patient Care Team: [name], MD as PCP - General [place] Ing Lenise Herald, MD (Inactive) as PCP - Cardiology (Cardiology) Gala Romney Cristopher Estimable, MD (Gastroenterology) Deneise Lever, MD as Consulting Physician (Pulmonary Disease) Jovita Gamma, MD as Consulting Physician (Neurosurgery) Suella Broad, MD as Consulting Physician (Physical Medicine and Rehabilitation) Pieter Partridge, DO as Consulting Physician (Neurology) Lavonna Monarch, MD as Consulting Physician (Dermatology) Leta Baptist, MD as Consulting Physician (Otolaryngology) Latanya Maudlin, MD as Consulting Physician (Orthopedic Surgery) Cloria Spring, MD as Consulting Physician ([place]) Melina Schools,  MD as Consulting Physician (Orthopedic Surgery) Lendon Colonel, NP as Nurse  Practitioner (Nurse Practitioner) Milus Banister, MD as Attending Physician (Gastroenterology) Pyrtle, Lajuan Lines, MD as Consulting Physician (Gastroenterology) Monico Hoar, PT as Physical Therapist (Physical Therapy) Conrad Brandermill, MD as Consulting Physician (Vascular Surgery) Inocencio Homes, DPM as Consulting Physician (Podiatry) Newt Minion, MD as Consulting Physician (Orthopedic Surgery) Jon Billings, RN as [place] Management Lavonna Monarch, MD as Consulting Physician (Dermatology) Beryle Lathe, [facility] (Pharmacist)  Objective:  Lab Results  Component Value Date   CREATININE 1.23 (H) 12/23/2020   CREATININE 1.10 (H) 11/24/2020   CREATININE 0.98 11/18/2020    Lab Results  Component Value Date   HGBA1C 5.9 (A) 01/17/2021   Last diabetic Eye exam:  Lab Results  Component Value Date/Time   HMDIABEYEEXA No Retinopathy 05/26/2020 12:00 AM    Last diabetic Foot exam:  Lab Results  Component Value Date/Time   HMDIABFOOTEX yes 09/14/2009 12:00 AM        Component Value Date/Time   CHOL 128 09/01/2020 1444   TRIG 232 (H) 09/01/2020 1444   HDL 40 09/01/2020 1444   CHOLHDL 3.2 09/01/2020 1444   CHOLHDL 3.5 09/30/2018 1143   VLDL 26 07/19/2015 0925   LDLCALC 51 09/01/2020 1444   LDLCALC 79 09/30/2018 1143    Hepatic Function Latest Ref Rng & Units 12/23/2020 11/24/2020 11/18/2020  Total Protein 6.5 - 8.1 g/dL 7.1 7.8 7.2  Albumin 3.5 - 5.0 g/dL 3.8 4.3 4.5  AST 15 - 41 U/L _0 ALT 0 - 44 U/L _1 Alk Phosphatase 38 - 126 U/L 78 108 93  Total Bilirubin 0.3 - 1.2 mg/dL 0.6 0.2(L) 0.3  Bilirubin, Direct 0.1 - 0.5 mg/dL - - -    Lab Results  Component Value Date/Time   TSH 0.631 12/11/2019 12:25 PM   TSH 0.94 02/17/2019 04:26 PM   FREET4 0.78 02/17/2019 04:26 PM   FREET4 0.98 10/08/2017 03:59 PM    CBC Latest Ref Rng & Units 12/23/2020 11/24/2020 11/18/2020  WBC 4.0 - 10.5 K/uL 5.6 5.8 7.2  Hemoglobin 12.0 - 15.0 g/dL 10.5(L)  11.3(L) 10.8(L)  Hematocrit 36.0 - 46.0 % 36.5 39.6 37.9  Platelets 150 - 400 K/uL 254 235 253    Lab Results  Component Value Date/Time   VD25OH 45.5 09/01/2020 02:44 PM   VD25OH 44.60 01/28/2020 12:24 PM    Clinical ASCVD: No  The ASCVD Risk score (Arnett DK, et al., 2019) failed to calculate for the following reasons:   The valid total cholesterol range is 130 to 320 mg/dL    Social History   Tobacco Use  Smoking Status Some Days   Years: 30.00   Types: Cigarettes  Smokeless Tobacco Never  Tobacco Comments   quit 7 weeks ago-07/14/2020-AH    BP Readings from Last 3 Encounters:  03/04/21 (!) 147/71  01/21/21 (!) 136/54  01/17/21 130/82   Pulse Readings from Last 3 Encounters:  03/04/21 75  01/21/21 61  01/17/21 60   Wt Readings from Last 3 Encounters:  03/04/21 171 lb 1.3 oz (77.6 kg)  01/21/21 168 lb (76.2 kg)  01/17/21 167 lb 3.2 oz (75.8 kg)    Assessment: Review of patient past medical history, allergies, medications, health status, including review of consultants reports, laboratory and other test data, was performed as part of comprehensive evaluation and provision of chronic care management services.   SDOH:  (Social Determinants of  Health) assessments and interventions performed:    CCM Care Plan  Allergies  Allergen Reactions   Lac Bovis Rash    Doesn't agree with stomach.    Phenazopyridine Hcl Hives   Cephalexin Hives   Flonase [Fluticasone]     "It gave me ulcers in my nose"   Iron Nausea And Vomiting    And itching   Milk-Related Compounds Other (See Comments)    Doesn't agree with stomach.    Penicillins Hives    Has patient had a PCN reaction causing immediate rash, facial/tongue/throat swelling, SOB or lightheadedness with hypotension: Yes Has patient had a PCN reaction causing severe rash involving mucus membranes or skin necrosis: No Has patient had a PCN reaction that required hospitalization No Has patient had a PCN reaction  occurring within the last 10 years: No If all of the above answers are "NO", then may proceed with Cephalosporin use.    Phenazopyridine Hives   Phenazopyridine Hcl Hives          Medications Reviewed Today     Reviewed by Beryle Lathe, [facility] (Pharmacist) on 03/24/21 at 1132  Med List Status: <None>   Medication Order Taking? Sig Documenting Provider Last Dose Status Informant  Accu-Chek Softclix Lancets lancets [id number] Yes TEST BLOOD SUGAR THREE TIMES DAILY AS DIRECTED Philemon Kingdom, MD Taking Active Self  albuterol (VENTOLIN HFA) 108 (90 Base) MCG/ACT inhaler [id number] Yes INHALE 1 TO 2 PUFFS EVERY 6 HOURS AS NEEDED FOR WHEEZING, SHORTNESS OF Kerby Less, MD Taking Active   Alcohol Swabs (DROPSAFE ALCOHOL PREP) 70 % PADS [id number] Yes USE TO CLEAN FINGER PRIOR TO STICKING FOR BLOOD SUGAR. Philemon Kingdom, MD Taking Active   alendronate (FOSAMAX) 70 MG tablet [id number] Yes TAKE 1 TABLET (70 MG TOTAL) BY MOUTH EVERY 7 (SEVEN) DAYS. TAKE WITH A FULL GLASS OF WATER ON AN EMPTY STOMACH. [name], MD Taking Active   amLODipine (NORVASC) 10 MG tablet [id number] Yes TAKE 1 TABLET EVERY DAY  Patient taking differently: Take 10 mg by mouth daily.   Imogene Burn, PA-C Taking Active   ascorbic acid (VITAMIN C) 500 MG tablet [id number] Yes Take 500 mg by mouth daily. [provider] Taking Active Self  aspirin EC 81 MG tablet [id number] Yes Take 1 tablet (81 mg total) by mouth daily with breakfast. Roxan Hockey, MD Taking Active Self  Blood Glucose Calibration (ACCU-CHEK GUIDE CONTROL) Yehuda Budd [id number] Yes USE AS DIRECTED Philemon Kingdom, MD Taking Active   blood glucose meter kit and supplies [id number] Yes Dispense based on patient and insurance preference. Use up to four times daily as directed. (FOR ICD-10 E10.9, E11.9). Philemon Kingdom, MD Taking Active Self  buPROPion (WELLBUTRIN XL) 150 MG 24 hr tablet [id number] Yes Take 1 tablet (150 mg  total) by mouth every morning. Cloria Spring, MD Taking Active   cetirizine (ZYRTEC) 10 MG tablet [id number] Yes Take 1 tablet (10 mg total) by mouth daily. [name], MD Taking Active Self  Clobetasol Prop Emollient Base (CLOBETASOL PROPIONATE E) 0.05 % emollient cream [id number] Yes Apply to affected area qd Lavonna Monarch, MD Taking Active   Continuous Blood Gluc Sensor (FREESTYLE LIBRE 14 DAY SENSOR) [place] [id number] No 1 each by Does not apply route every 14 (fourteen) days. Change every 2 weeks  Patient not taking: Reported on 03/24/2021   Philemon Kingdom, MD Not Taking Active   diclofenac Sodium (VOLTAREN) 1 % GEL [id number] Yes APPLY 2 GRAMS  TOPICALLY FOUR TIMES  DAILY.  Patient taking differently: Apply 2 g topically 4 (four) times daily.   Esterwood, Amy S, PA-C Taking Active   DROPLET PEN NEEDLES 31G X 8 MM MISC [id number] Yes USE FOR INJECTING INSULIN 4 TIMES DAILY. Philemon Kingdom, MD Taking Active Self  ezetimibe (ZETIA) 10 MG tablet [id number] Yes TAKE 1 TABLET EVERY DAY  Patient taking differently: Take 10 mg by mouth daily.   [name], MD Taking Active   famotidine (PEPCID) 40 MG tablet [id number] Yes Take 40 mg by mouth 2 (two) times daily. [provider] Taking Active   hydrOXYzine (ATARAX/VISTARIL) 10 MG tablet [id number] No Take 1 tablet (10 mg total) by mouth 3 (three) times daily as needed.  Patient taking differently: Take 10 mg by mouth 3 (three) times daily as needed for itching or anxiety.   [name], MD Unknown Active   insulin aspart (NOVOLOG FLEXPEN) 100 UNIT/ML FlexPen [id number] Yes INJECT 16 TO 18 UNITS UNDER SKIN UP TO 3 TIMES A DAY BEFORE MEALS  (DISCARD PEN 28 DAYS AFTER OPENING)  Patient taking differently: Inject 14-17 Units into the skin 3 (three) times daily with meals. (DISCARD PEN [place])   Philemon Kingdom, MD Taking Active   insulin glargine, 2 Unit Dial, (TOUJEO MAX SOLOSTAR) 300 UNIT/ML Solostar Pen  [id number] Yes Inject 24 Units into the skin at bedtime.  Patient taking differently: Inject 28 Units into the skin at bedtime.   Philemon Kingdom, MD Taking Active   lamoTRIgine (LAMICTAL) 100 MG tablet [id number] Yes Take 1 tablet (100 mg total) by mouth 2 (two) times daily. Cloria Spring, MD Taking Active   levothyroxine (SYNTHROID) 50 MCG tablet [id number] Yes Take 1 tablet (50 mcg) 6 days 1/2 tablet (25 mcg) 1 day weekly. Philemon Kingdom, MD Taking Active Self  LORazepam (ATIVAN) 0.5 MG tablet [id number] Yes Take 1 tablet (0.5 mg total) by mouth 2 (two) times daily. Cloria Spring, MD Taking Active   losartan (COZAAR) 100 MG tablet [id number] Yes Take 1 tablet (100 mg total) by mouth daily. [name], MD Taking Active Self  meclizine (ANTIVERT) 25 MG tablet [id number] Yes Take 1 tablet (25 mg total) by mouth 3 (three) times daily as needed for dizziness. Noreene Larsson, NP Taking Active Self  methocarbamol (ROBAXIN) 500 MG tablet [id number] Yes Take one tablet by mouth two times daily , as needed, for neck and back pain and spasm  Patient taking differently: Take 500 mg by mouth 2 (two) times daily as needed for muscle spasms (for neck and back pain and spasm).   [name], MD Taking Active   metoprolol tartrate (LOPRESSOR) 50 MG tablet [id number] Yes TAKE 1 TABLET TWICE DAILY (NEED MD APPOINTMENT)  Patient taking differently: Take 50 mg by mouth 2 (two) times daily.   Erma Heritage, PA-C Taking Active   mirabegron ER (MYRBETRIQ) 25 MG TB24 tablet [id number] Yes Take 1 tablet (25 mg total) by mouth daily. [name], MD Taking Active   Misc. Devices (MATTRESS PAD) MISC [id number]  FIRM MATTRESS PAD for hospital bed x 1 [name], MD  Active   montelukast (SINGULAIR) 10 MG tablet [id number] Yes TAKE 1 TABLET EVERY DAY  Patient taking differently: Take 10 mg by mouth daily.   [name], MD Taking Active   nystatin (MYCOSTATIN/NYSTOP) powder  [id number] Yes APPLY TO AFFECTED AREA 4 TIMES DAILY. [name], MD Taking Consider Medication Status and Discontinue  Omega-3 Fatty Acids (FISH OIL PO) [id number] Yes Take 1 capsule by mouth daily. [provider] Taking Active Self  oxyCODONE-acetaminophen (PERCOCET) 10-325 MG tablet [id number] Yes Take 1 tablet by mouth every 8 (eight) hours as needed for pain. [provider] Taking Active Self           Med Note Waldo Laine, [place] Mar 24, 2021 11:28 AM) Patient only using twice daily as needed   Plecanatide (TRULANCE) 3 MG TABS [id number] Yes Take 3 mg by mouth daily as needed. Jerene Bears, MD Taking Active   pregabalin (LYRICA) 75 MG capsule [id number] Yes Take 1 capsule (75 mg total) by mouth daily. Rexene Alberts, MD Taking Active Self  RABEprazole (ACIPHEX) 20 MG tablet [id number] Yes TAKE 1 TABLET TWICE DAILY Pyrtle, Lajuan Lines, MD Taking Active   RESTASIS 0.05 % ophthalmic emulsion [id number] Yes Place 2 drops into both eyes daily. [provider] Taking Active Self  risperiDONE (RISPERDAL) 0.5 MG tablet [id number] Yes Take 1 tablet (0.5 mg total) by mouth at bedtime. Cloria Spring, MD Taking Active   rosuvastatin (CRESTOR) 5 MG tablet [id number] Yes TAKE 1 TABLET AT BEDTIME [name], MD Taking Active   sertraline (ZOLOFT) 100 MG tablet [id number] Yes Take 1 tablet (100 mg total) by mouth daily. Cloria Spring, MD Taking Active   spironolactone (ALDACTONE) 25 MG tablet [id number] No Take 1 tablet (25 mg total) by mouth daily. [name], MD Unknown Active   STIOLTO RESPIMAT 2.5-2.5 MCG/ACT AERS [id number] Yes INHALE 2 PUFFS INTO THE LUNGS DAILY.  Patient taking differently: Inhale 2 puffs into the lungs daily.   Parrett, Fonnie Mu, NP Taking Active   traZODone (DESYREL) 150 MG tablet [id number] Yes Take 1 tablet (150 mg total) by mouth at bedtime. Cloria Spring, MD Taking Active   UNABLE TO FIND [phone]  Hospital bed mattress x 1   DX: G47.33, J44.9 [name], MD  Active Self  UNABLE TO FIND [phone]  Standard wheelchair Dx M47.16, R29.898 [name], MD  Active Self  UNABLE TO FIND [phone]  Hospital bed with firm mattress x 1 DX G47.33, G89.4 [name], MD  Active   UNABLE TO FIND [id number] Yes Med Name: Activice cooling gel spray on arms, legs, and back [provider] Taking Active   Med List Note Lisette Abu 04/10/17 1804): Medications have been confirmed via pharmacy records [facility] [phone]            Patient Active Problem List   Diagnosis Date Noted   Advanced care planning/counseling discussion 12/23/2020   Head trauma, initial encounter 12/20/2020   Trigger finger, right 12/12/2020   Confusion 11/24/2020   Blurry vision 11/18/2020   Cervical radiculopathy 07/09/2020   Closed fracture of lateral malleolus of right fibula 02/27/2020   Headache 02/19/2020   Tubular adenoma of colon 02/09/2019   Benign neoplasm of cerebral meninges ([place]) 11/12/2018   Benign meningioma of brain ([place]) 10/31/2018   Osteoarthritis of both hips 07/06/2018   Lipoma of extremity 12/31/2017   Impingement syndrome of left shoulder region 12/27/2017   Leg weakness, bilateral 10/28/2017   Lumbar spondylosis with myelopathy 10/12/2017   Numbness of hand 10/10/2017   Chronic neck pain 07/25/2017   Chronic pain syndrome 07/25/2017   At risk for cardiovascular event 06/10/2017   Diabetic polyneuropathy associated with type 2 diabetes mellitus ([place]) 05/19/2016   Obesity (BMI  30.0-34.9) 04/24/2016   History of palpitations 08/09/2015   Labile hypertension 08/03/2015   Normal coronary arteries 08/03/2015   Dizziness 07/15/2015   Left-sided low back pain with left-sided sciatica 06/27/2015   Multinodular goiter 05/06/2015   Rectocele, female 04/27/2015   Anal sphincter incontinence 04/27/2015   Pelvic relaxation due to  rectocele 03/30/2015   Pulmonary hypertension ([place]) 02/22/2015   Posterior chest pain 02/21/2015   Episodic cigarette smoking dependence 01/11/2015   Migraine without aura and without status migrainosus, not intractable 07/02/2014   Flatulence 02/18/2014   Microcytic anemia 02/18/2014   COPD mixed type ([place]) 09/16/2013   Hypothyroidism 08/16/2013   Gastroparesis 04/28/2013   Seizure disorder ([place]) 01/19/2013   Displacement of cervical intervertebral disc without myelopathy 12/13/2012   Bursitis of shoulder 12/13/2012   Cervical disc disorder with radiculopathy of cervical region 10/31/2012   Solitary pulmonary nodule 08/19/2012   Anemia 07/05/2012   Hypersomnia disorder related to a known organic factor 06/11/2012   Pruritus 04/18/2012   Meningioma ([place]) 11/19/2011   Mononeuritis leg 10/25/2011   Carpal tunnel syndrome of right wrist 05/23/2011   Chronic pain of right hand 05/04/2011   Polypharmacy 04/28/2011   Bipolar disorder ([place]) 04/28/2011   Constipation 04/13/2011   Recurrent falls 12/12/2010   Oropharyngeal dysphagia 07/12/2010   Urinary incontinence 12/16/2009   HEARING LOSS 10/26/2009   Well controlled type 2 diabetes mellitus with gastroparesis ([place]) 07/07/2009   Hyperlipidemia 12/11/2008   IBS 12/11/2008   GERD 07/29/2008   MILK PRODUCTS ALLERGY 07/29/2008   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/03/2007   Essential hypertension 06/27/2007   Backache 06/19/2007   Osteoporosis 06/19/2007   Obstructive sleep apnea 06/19/2007   TRIGGER FINGER 04/18/2007   DIVERTICULOSIS, COLON 11/13/2006    Immunization History  Administered Date(s) Administered   Fluad Quad(high Dose 65+) 01/01/2019, 12/11/2019, 11/10/2020   H1N1 01/09/2008   Influenza Split 12/12/2010, 11/16/2011   Influenza Whole 11/23/2006, 12/10/2007, 12/11/2008, 11/16/2009   Influenza,inj,Quad PF,6+ Mos 11/28/2012, 11/20/2013, 11/10/2014, 12/06/2015, 11/21/2016, 11/20/2017    Influenza,inj,quad, With Preservative 12/11/2016, 01/07/2018   Moderna Sars-Covid-2 Vaccination 04/27/2019, 05/25/2019, 02/16/2020   Pneumococcal Conjugate-13 09/16/2013   Pneumococcal Polysaccharide-23 08/03/2003, 12/11/2008, 08/22/2010, 08/31/2015   Td 09/23/2003   Tdap 12/29/2011   Zoster Recombinat (Shingrix) 02/12/2019, 07/03/2019    Conditions to be addressed/monitored: HTN, HLD, COPD, DMII, CKD Stage 3a, Anxiety, and Depression  Care Plan : Medication Management  Updates made by Beryle Lathe, [place] since 03/24/2021 12:00 AM     Problem: T2DM, HTN, HLD, COPD, CKD, Depression/Anxiety   Priority: High  Onset Date: 03/24/2021     Long-Range Goal: Disease Progression Prevention   Start Date: 03/24/2021  Expected End Date: 06/22/2021  This Visit's Progress: On track  Priority: High  Note:   Current Barriers:  Unable to independently monitor therapeutic efficacy Unable to achieve control of hypertension and hyperlipidemia  Pharmacist Clinical Goal(s):  Through collaboration with PharmD and provider, patient will  Achieve adherence to monitoring guidelines and medication adherence to achieve therapeutic efficacy Achieve control of hypertension and hyperlipidemia as evidenced by improved blood pressure control and improved triglycerides   Interventions: 1:1 collaboration with [name], MD regarding development and update of comprehensive plan of care as evidenced by provider attestation and co-signature Inter-disciplinary care team collaboration (see longitudinal plan of care) Comprehensive medication review performed; medication list updated in electronic medical record  Medication reconciliation: Difficult to obtain since patient report she does not handle her medications. She  reports that she has a home health nurse come to her house every other Monday to fill her pill box.  She handles her refills alone and tries to set up everything on automatic refill but  has a hard time managing all of her medications  Type 2 Diabetes - New goal.: Follows with [name] Controlled; Most recent A1c at goal of <7% per ADA guidelines Current medications: insulin glargine (Toujeo) 24 units subcutaneously at bedtime and insulin aspart (Novolog) 16-18 units subcutaneously 5-15 minutes before each meal Intolerances:  metformin Taking medications as directed: no, patient reports using Toujeo 28 units at bedtime and Novolog 14-17 units before meals Side effects thought to be attributed to current medication regimen: no Denies hypoglycemic symptoms (sweaty and shaky). Hypoglycemia prevention:  recommend that endocrinology consider prescribing Current meal patterns: not discussed today Current exercise: not discussed today On a statin: yes On aspirin 81 mg daily: yes Last microalbumin/creatinine ratio: 24 (08/21/19); on an ACEi/ARB: yes Last eye exam: completed within last year Last foot exam: completed within last year Current glucose readings:  patient reports checking finger stick blood glucose four times daily. Unsure of many readings lately other than reports fasting blood glucose 115-150.  Per endocrinology note, may be discussing insulin pump and continuous glucose monitor Continue current medications as above per endocrinology. Need to make sure she understands how many units to inject. May not need such tight glycemic control based on other health factors Discussed management of hypoglycemia. If blood sugar <70 at any time, treat with simple sugar such as 1/2 cup juice or regular soda or 3-4 glucose tablets. Recheck blood sugar in 15 minutes and repeat if blood sugar remains <70.  Continue follow-up with endocrinology  Hypertension - New goal.: Follows with [name] Blood pressure under poor control. Blood pressure is above goal of <130/80 mmHg per 2017 AHA/ACC guidelines. Current medications: losartan 100 mg by mouth once daily, amlodipine 10 mg by mouth  once daily, spironolactone 25 mg by mouth once daily, and metoprolol tartrate 50 mg by mouth twice daily Intolerances: none Taking medications as directed:  questionable adherence to spironolactone. Patient unable to confirm that she was taking this with me today Side effects thought to be attributed to current medication regimen: yes, patient reports her home health nurse has her taking all of her blood pressure medications in the morning and patient feels like this is too much all at one time and often makes her dizzy and prone to fall. Current home blood pressure: not discussed today Continue current medications as above Encourage dietary sodium restriction/DASH diet Recommend home blood pressure monitoring to discuss at next visit Discussed need for medication compliance Recommend administration of losartan and amlodipine in the evening or at bedtime. Take spironolactone every morning. Take metoprolol every morning and every evening.   Hyperlipidemia - New goal.: Follows with [name] Uncontrolled. LDL at goal of <70 due to very high risk given diabetes + at least 1 additional major risk factor (advancing age, hypertension, chronic kidney disease (CKD), and cigarette smoking) per 2020 AACE/ACE guidelines. Triglycerides above goal of <150 per 2020 AACE/ACE guidelines. Current medications: rosuvastatin 5 mg by mouth once daily, ezetimibe 10 mg by mouth once daily, and over the counter fish oil Intolerances: none Taking medications as directed: yes Side effects thought to be attributed to current medication regimen: no Continue current medications as above Encourage dietary reduction of high fat containing foods such as butter, nuts, bacon, egg yolks, etc. Discussed need for  medication compliance Patient counseled on smoking cessation Continue follow-up with cardiology Triglycerides likely elevated due to current smoking status  Chronic Obstructive Pulmonary Disease - New goal.: Follows  with [name] Controlled Current treatment: albuterol metered dose (ProAir, Ventolin, Proventil) 2 puffs by mouth every 6 hours as needed for shortness of breath or wheezing and tiotropium/olodaterol (Stiolto Respimat) 2 inhalations once daily and montelukast 10 mg by mouth at bedtime Received Trelegy samples but ran out GOLD Classification: unknown Most recent Pulmonary Function Testing: unknown 0 exacerbations requiring treatment in the last 6 months  Current oxygen requirements: unknown Continue current medications as above Patient counseled on smoking cessation Continue follow-up with pulmonology  Chronic Kidney Disease Stage 3a - GFR 45-59 (Mild to Moderately Reduced Function) - New goal.: Referred to Dr. Theador Hawthorne but has not been seen for initial evaluation yet Appropriately managed Current medications: losartan 100 mg by mouth once daily, spironolactone 25 mg by mouth once daily, and rosuvastatin 5 mg by mouth once daily Intolerances: none Taking medications as directed:  questionable adherence to spironolactone Side effects thought to be attributed to current medication regimen: no Most recent GFR: 47 mL/min Most recent microalbumin: 24 (08/21/19) Continue current medications as above Recommend adequate hydration  Patient counseled on smoking cessation Discussed need for medication compliance Discussed need for improved control of blood pressure Continue follow-up with nephrology   Depression and Anxiety - New goal.: Follows with Dr. Arvilla Meres Depression: sertraline (Zoloft)  100 mg by mouth once daily and bupropion (Wellbutrin) 150 mg by mouth once daily Anxiety: lorazepam (Ativan) 0.5 mg by mouth twice daily and hydroxyzine 10 mg by mouth every 8 hours as needed Mood stabilization: lamotrigine 100 mg by mouth twice daily, risperidone 0.5 mg by mouth at bedtime Sleep: trazodone 150 mg by mouth at bedtime  Continue current medications as above Continue follow-up with  behavioral health  Patient Goals/Self-Care Activities Patient will:  Focus on medication adherence by keeping up with prescription refills and either using a pill box or reminders to take your medications at the prescribed times Check blood sugar four times a day at the following times: fasting (at least 8 hours since last food consumption), 1-2 hours after all meals, and whenever patient experiences symptoms of hypo/hyperglycemia, document, and provide at future appointments Follow-up with all specialists as scheduled  Follow Up Plan: Telephone follow up appointment with care management team member scheduled for: 04/27/21      Medication Assistance: None required.  Patient affirms current coverage meets needs.  Patient's preferred pharmacy is:  [facility], [address] [place] [address] Phone: [phone] Fax: [phone] - [place], [address] [place] [address] Phone: [phone] Fax: [phone]  Follow Up:  Patient agrees to Care Plan and Follow-up.  Plan: Telephone follow up appointment with care management team member scheduled for:  04/27/21  Kennon Holter, PharmD, [place], [place] Clinical Pharmacist Practitioner [facility] [phone]

## 2021-03-24 NOTE — Patient Instructions (Signed)
[name],  It was great to talk to you today!  Please call me with any questions or concerns.  Visit Information   Following is a copy of your full care plan:  Care Plan : Medication Management  Updates made by Beryle Lathe, [place] since 03/24/2021 12:00 AM     Problem: T2DM, HTN, HLD, COPD, CKD, Depression/Anxiety   Priority: High  Onset Date: 03/24/2021     Long-Range Goal: Disease Progression Prevention   Start Date: 03/24/2021  Expected End Date: 06/22/2021  This Visit's Progress: On track  Priority: High  Note:   Current Barriers:  Unable to independently monitor therapeutic efficacy Unable to achieve control of hypertension and hyperlipidemia  Pharmacist Clinical Goal(s):  Through collaboration with PharmD and provider, patient will  Achieve adherence to monitoring guidelines and medication adherence to achieve therapeutic efficacy Achieve control of hypertension and hyperlipidemia as evidenced by improved blood pressure control and improved triglycerides   Interventions: 1:1 collaboration with [name], MD regarding development and update of comprehensive plan of care as evidenced by provider attestation and co-signature Inter-disciplinary care team collaboration (see longitudinal plan of care) Comprehensive medication review performed; medication list updated in electronic medical record  Medication reconciliation: Difficult to obtain since patient report she does not handle her medications. She reports that she has a home health nurse come to her house every other Monday to fill her pill box.  She handles her refills alone and tries to set up everything on automatic refill but has a hard time managing all of her medications  Type 2 Diabetes - New goal.: Follows with [name] Controlled; Most recent A1c at goal of <7% per ADA guidelines Current medications: insulin glargine (Toujeo) 24 units subcutaneously at bedtime and insulin aspart  (Novolog) 16-18 units subcutaneously 5-15 minutes before each meal Intolerances:  metformin Taking medications as directed: no, patient reports using Toujeo 28 units at bedtime and Novolog 14-17 units before meals Side effects thought to be attributed to current medication regimen: no Denies hypoglycemic symptoms (sweaty and shaky). Hypoglycemia prevention:  recommend that endocrinology consider prescribing Current meal patterns: not discussed today Current exercise: not discussed today On a statin: yes On aspirin 81 mg daily: yes Last microalbumin/creatinine ratio: 24 (08/21/19); on an ACEi/ARB: yes Last eye exam: completed within last year Last foot exam: completed within last year Current glucose readings:  patient reports checking finger stick blood glucose four times daily. Unsure of many readings lately other than reports fasting blood glucose 115-150.  Per endocrinology note, may be discussing insulin pump and continuous glucose monitor Continue current medications as above per endocrinology. Need to make sure she understands how many units to inject. May not need such tight glycemic control based on other health factors Discussed management of hypoglycemia. If blood sugar <70 at any time, treat with simple sugar such as 1/2 cup juice or regular soda or 3-4 glucose tablets. Recheck blood sugar in 15 minutes and repeat if blood sugar remains <70.  Continue follow-up with endocrinology  Hypertension - New goal.: Follows with [name] Blood pressure under poor control. Blood pressure is above goal of <130/80 mmHg per 2017 AHA/ACC guidelines. Current medications: losartan 100 mg by mouth once daily, amlodipine 10 mg by mouth once daily, spironolactone 25 mg by mouth once daily, and metoprolol tartrate 50 mg by mouth twice daily Intolerances: none Taking medications as directed:  questionable adherence to spironolactone. Patient unable to confirm that she was taking this with me  today Side effects thought to be attributed to current medication regimen: yes, patient reports her home health nurse has her taking all of her blood pressure medications in the morning and patient feels like this is too much all at one time and often makes her dizzy and prone to fall. Current home blood pressure: not discussed today Continue current medications as above Encourage dietary sodium restriction/DASH diet Recommend home blood pressure monitoring to discuss at next visit Discussed need for medication compliance Recommend administration of losartan and amlodipine in the evening or at bedtime. Take spironolactone every morning. Take metoprolol every morning and every evening.   Hyperlipidemia - New goal.: Follows with [name] Uncontrolled. LDL at goal of <70 due to very high risk given diabetes + at least 1 additional major risk factor (advancing age, hypertension, chronic kidney disease (CKD), and cigarette smoking) per 2020 AACE/ACE guidelines. Triglycerides above goal of <150 per 2020 AACE/ACE guidelines. Current medications: rosuvastatin 5 mg by mouth once daily, ezetimibe 10 mg by mouth once daily, and over the counter fish oil Intolerances: none Taking medications as directed: yes Side effects thought to be attributed to current medication regimen: no Continue current medications as above Encourage dietary reduction of high fat containing foods such as butter, nuts, bacon, egg yolks, etc. Discussed need for medication compliance Patient counseled on smoking cessation Continue follow-up with cardiology Triglycerides likely elevated due to current smoking status  Chronic Obstructive Pulmonary Disease - New goal.: Follows with [name] Controlled Current treatment: albuterol metered dose (ProAir, Ventolin, Proventil) 2 puffs by mouth every 6 hours as needed for shortness of breath or wheezing and tiotropium/olodaterol (Stiolto Respimat) 2 inhalations once daily and  montelukast 10 mg by mouth at bedtime Received Trelegy samples but ran out GOLD Classification: unknown Most recent Pulmonary Function Testing: unknown 0 exacerbations requiring treatment in the last 6 months  Current oxygen requirements: unknown Continue current medications as above Patient counseled on smoking cessation Continue follow-up with pulmonology  Chronic Kidney Disease Stage 3a - GFR 45-59 (Mild to Moderately Reduced Function) - New goal.: Referred to Dr. Theador Hawthorne but has not been seen for initial evaluation yet Appropriately managed Current medications: losartan 100 mg by mouth once daily, spironolactone 25 mg by mouth once daily, and rosuvastatin 5 mg by mouth once daily Intolerances: none Taking medications as directed:  questionable adherence to spironolactone Side effects thought to be attributed to current medication regimen: no Most recent GFR: 47 mL/min Most recent microalbumin: 24 (08/21/19) Continue current medications as above Recommend adequate hydration  Patient counseled on smoking cessation Discussed need for medication compliance Discussed need for improved control of blood pressure Continue follow-up with nephrology   Depression and Anxiety - New goal.: Follows with Dr. Arvilla Meres Depression: sertraline (Zoloft)  100 mg by mouth once daily and bupropion (Wellbutrin) 150 mg by mouth once daily Anxiety: lorazepam (Ativan) 0.5 mg by mouth twice daily and hydroxyzine 10 mg by mouth every 8 hours as needed Mood stabilization: lamotrigine 100 mg by mouth twice daily, risperidone 0.5 mg by mouth at bedtime Sleep: trazodone 150 mg by mouth at bedtime  Continue current medications as above Continue follow-up with behavioral health  Patient Goals/Self-Care Activities Patient will:  Focus on medication adherence by keeping up with prescription refills and either using a pill box or reminders to take your medications at the prescribed times Check blood sugar  four times a day at the following times: fasting (at least 8 hours since last food consumption), 1-2  hours after all meals, and whenever patient experiences symptoms of hypo/hyperglycemia, document, and provide at future appointments Follow-up with all specialists as scheduled  Follow Up Plan: Telephone follow up appointment with care management team member scheduled for: 04/27/21      Consent to CCM Services: Ms. Marina was given information about Chronic Care Management services including:  CCM service includes personalized support from designated clinical staff supervised by her physician, including individualized plan of care and coordination with other care providers 24/7 contact phone numbers for assistance for urgent and routine care needs. Service will only be billed when office clinical staff spend 20 minutes or more in a month to coordinate care. Only one practitioner may furnish and bill the service in a calendar month. The patient may stop CCM services at any time (effective at the end of the month) by phone call to the office staff. The patient will be responsible for cost sharing (co-pay) of up to 20% of the service fee (after annual deductible is met).  Patient agreed to services and verbal consent obtained.   Plan: Telephone follow up appointment with care management team member scheduled for:  04/27/21  The patient verbalized understanding of instructions, educational materials, and care plan provided today and agreed to receive a mailed copy of patient instructions, educational materials, and care plan.   Please call the care guide team at [phone] if you need to cancel or reschedule your appointment.   Kennon Holter, PharmD, [place], CPP Clinical Pharmacist Practitioner [facility] [phone]

## 2021-03-28 ENCOUNTER — Other Ambulatory Visit: Payer: Self-pay | Admitting: Internal Medicine

## 2021-03-29 ENCOUNTER — Ambulatory Visit (HOSPITAL_COMMUNITY): Payer: Medicare HMO | Admitting: Psychiatry

## 2021-03-29 ENCOUNTER — Other Ambulatory Visit: Payer: Self-pay

## 2021-03-29 DIAGNOSIS — J42 Unspecified chronic bronchitis: Secondary | ICD-10-CM | POA: Diagnosis not present

## 2021-03-29 DIAGNOSIS — F17210 Nicotine dependence, cigarettes, uncomplicated: Secondary | ICD-10-CM | POA: Diagnosis not present

## 2021-03-29 DIAGNOSIS — M160 Bilateral primary osteoarthritis of hip: Secondary | ICD-10-CM | POA: Diagnosis not present

## 2021-03-29 DIAGNOSIS — E039 Hypothyroidism, unspecified: Secondary | ICD-10-CM | POA: Diagnosis not present

## 2021-03-29 DIAGNOSIS — E1142 Type 2 diabetes mellitus with diabetic polyneuropathy: Secondary | ICD-10-CM | POA: Diagnosis not present

## 2021-03-29 DIAGNOSIS — J449 Chronic obstructive pulmonary disease, unspecified: Secondary | ICD-10-CM | POA: Diagnosis not present

## 2021-03-29 DIAGNOSIS — F250 Schizoaffective disorder, bipolar type: Secondary | ICD-10-CM | POA: Diagnosis not present

## 2021-03-29 DIAGNOSIS — D649 Anemia, unspecified: Secondary | ICD-10-CM | POA: Diagnosis not present

## 2021-03-29 DIAGNOSIS — R2681 Unsteadiness on feet: Secondary | ICD-10-CM | POA: Diagnosis not present

## 2021-03-29 NOTE — Telephone Encounter (Signed)
Patient called said [employer] did not receive the prescription for hospital bed.  Needed more detail information sent with this prescription saying she has chronic back pain.

## 2021-03-31 ENCOUNTER — Telehealth: Payer: Self-pay

## 2021-03-31 NOTE — Telephone Encounter (Signed)
Leda Gauze from [facility] called said patient needs a referral to neurology and to send a prescription for hospital bed and orthopedic mattress to [facility], phone # [phone].

## 2021-04-01 ENCOUNTER — Other Ambulatory Visit: Payer: Self-pay

## 2021-04-01 MED ORDER — UNABLE TO FIND: 0 refills | Status: DC

## 2021-04-01 NOTE — Telephone Encounter (Signed)
Sent hosp bed and notes to number requested

## 2021-04-04 NOTE — Telephone Encounter (Signed)
Re-faxed.

## 2021-04-07 ENCOUNTER — Telehealth: Payer: Self-pay | Admitting: Internal Medicine

## 2021-04-07 NOTE — Telephone Encounter (Signed)
Called and advised pt Yes, it looks like she needs to take the insulin even if she does not have an appetite, since sugars are high.  In fact, she would benefit from increasing the Toujeo from 28 to 32 units. If sugars remain high in 4 days, she can increase the dose further by another 40 units. Pt verbalized understanding.

## 2021-04-07 NOTE — Telephone Encounter (Signed)
Patient is experiencing High BG.  04/06/2021  last night 430                                   219 04/07/2021  am            185                  Lunch         204 Patient needs advise and instructions. [phone]

## 2021-04-07 NOTE — Telephone Encounter (Signed)
T, Yes, it looks like she needs to take the insulin even if she does not have an appetite, since sugars are high.  In fact, she would benefit from increasing the Toujeo from 28 to 32 units.  If sugars remain high in 4 days, she can increase the dose further by another 40 units.

## 2021-04-07 NOTE — Telephone Encounter (Signed)
Confirmed pt is currently taking 14-17 units 3X daily of Novolog and 28 units of Toujeo. Pt recently DX with Type 3 Kidney disease. Pt has d/c all medication previously prescribed by Nephrologist until her appt next week. Pt currently experiencing no appetite and nausea. Pt taking all doses of insulin even if she has no appetite. Please advise.

## 2021-04-11 DIAGNOSIS — D649 Anemia, unspecified: Secondary | ICD-10-CM | POA: Diagnosis not present

## 2021-04-11 DIAGNOSIS — R2681 Unsteadiness on feet: Secondary | ICD-10-CM | POA: Diagnosis not present

## 2021-04-11 DIAGNOSIS — J449 Chronic obstructive pulmonary disease, unspecified: Secondary | ICD-10-CM | POA: Diagnosis not present

## 2021-04-11 DIAGNOSIS — M160 Bilateral primary osteoarthritis of hip: Secondary | ICD-10-CM | POA: Diagnosis not present

## 2021-04-11 DIAGNOSIS — E039 Hypothyroidism, unspecified: Secondary | ICD-10-CM | POA: Diagnosis not present

## 2021-04-11 DIAGNOSIS — F17210 Nicotine dependence, cigarettes, uncomplicated: Secondary | ICD-10-CM | POA: Diagnosis not present

## 2021-04-11 DIAGNOSIS — J42 Unspecified chronic bronchitis: Secondary | ICD-10-CM | POA: Diagnosis not present

## 2021-04-11 DIAGNOSIS — F250 Schizoaffective disorder, bipolar type: Secondary | ICD-10-CM | POA: Diagnosis not present

## 2021-04-11 DIAGNOSIS — E1142 Type 2 diabetes mellitus with diabetic polyneuropathy: Secondary | ICD-10-CM | POA: Diagnosis not present

## 2021-04-12 ENCOUNTER — Other Ambulatory Visit: Payer: Self-pay

## 2021-04-12 DIAGNOSIS — R0989 Other specified symptoms and signs involving the circulatory and respiratory systems: Secondary | ICD-10-CM

## 2021-04-12 DIAGNOSIS — E1143 Type 2 diabetes mellitus with diabetic autonomic (poly)neuropathy: Secondary | ICD-10-CM | POA: Diagnosis not present

## 2021-04-12 DIAGNOSIS — N1831 Chronic kidney disease, stage 3a: Secondary | ICD-10-CM

## 2021-04-12 DIAGNOSIS — E785 Hyperlipidemia, unspecified: Secondary | ICD-10-CM | POA: Diagnosis not present

## 2021-04-12 NOTE — Patient Outreach (Signed)
[facility]) Care Management  04/12/2021  Ellinore Merced Maloof [birth year] [id number]   Case Closure   Case has been transferred to [facility] program and patient to be followed by assigned RN CM at PCP office.   Plan: RN CM will close case.    Enzo Montgomery, RN,BSN,CCM [place] Management Telephonic Care Management Coordinator Direct Phone: [phone] Toll Free: [phone] Fax: [phone]

## 2021-04-13 ENCOUNTER — Ambulatory Visit (HOSPITAL_COMMUNITY): Payer: Medicare HMO | Admitting: Psychiatry

## 2021-04-14 ENCOUNTER — Other Ambulatory Visit: Payer: Self-pay

## 2021-04-14 ENCOUNTER — Telehealth (INDEPENDENT_AMBULATORY_CARE_PROVIDER_SITE_OTHER): Payer: Medicare HMO | Admitting: Psychiatry

## 2021-04-14 ENCOUNTER — Other Ambulatory Visit: Payer: Self-pay | Admitting: Internal Medicine

## 2021-04-14 DIAGNOSIS — F331 Major depressive disorder, recurrent, moderate: Secondary | ICD-10-CM | POA: Diagnosis not present

## 2021-04-14 MED ORDER — SERTRALINE HCL 100 MG PO TABS: 100.0000 mg | ORAL_TABLET | Freq: Every day | ORAL | 2 refills | Status: DC

## 2021-04-14 MED ORDER — TRAZODONE HCL 150 MG PO TABS: 150.0000 mg | ORAL_TABLET | Freq: Every day | ORAL | 2 refills | Status: DC

## 2021-04-14 MED ORDER — RISPERIDONE 0.5 MG PO TABS
0.5000 mg | ORAL_TABLET | Freq: Every day | ORAL | 2 refills | Status: DC
Start: 2021-04-14 — End: 2021-07-12

## 2021-04-14 MED ORDER — LAMOTRIGINE 100 MG PO TABS: 100.0000 mg | ORAL_TABLET | Freq: Two times a day (BID) | ORAL | 2 refills | Status: DC

## 2021-04-14 MED ORDER — BUPROPION HCL ER (XL) 150 MG PO TB24: 150.0000 mg | ORAL_TABLET | ORAL | 2 refills | Status: DC

## 2021-04-14 MED ORDER — LORAZEPAM 0.5 MG PO TABS: 0.5000 mg | ORAL_TABLET | Freq: Two times a day (BID) | ORAL | 2 refills | Status: DC

## 2021-04-14 NOTE — Progress Notes (Signed)
Virtual Visit via Telephone Note  I connected with [name] on 04/14/21 at  3:40 PM EST by telephone and verified that I am speaking with the correct person using two identifiers.  Location: Patient: home Provider: office   I discussed the limitations, risks, security and privacy concerns of performing an evaluation and management service by telephone and the availability of in person appointments. I also discussed with the patient that there may be a patient responsible charge related to this service. The patient expressed understanding and agreed to proceed.       I discussed the assessment and treatment plan with the patient. The patient was provided an opportunity to ask questions and all were answered. The patient agreed with the plan and demonstrated an understanding of the instructions.   The patient was advised to call back or seek an in-person evaluation if the symptoms worsen or if the condition fails to improve as anticipated.  I provided 12 minutes of non-face-to-face time during this encounter.   Levonne Spiller, MD  [facility] MD/PA/NP OP Progress Note  04/14/2021 3:49 PM [name]  MRN:  [id number]  Chief Complaint:  Chief Complaint   Depression; Anxiety; Follow-up    HPI: This patient is a 71 year old divorced white female who lives alone in [place].  She had worked in a [employer] but is now on disability.  The patient returns for follow-up after 3 months.  She states that she is doing about the same.  She denies significant depression.  She does have a home health aide coming in 5 days a week to help her.  Her niece helps her once a month right her pills.  She is sleeping a little better since I increase the trazodone and she got a new mattress pad.  She denies suicidal ideation and her anxiety is under fairly good control.  She denies significant mood swings. Visit Diagnosis:    ICD-10-CM   1. Major depressive disorder, recurrent episode, moderate (HCC)   F33.1       Past Psychiatric History: Numerous hospitalizations for depression years ago including ECT treatment  Past Medical History:  Past Medical History:  Diagnosis Date   Allergy    Anemia    Anxiety    takes Ativan daily   Arthritis    Assistance needed for mobility    Bipolar disorder ([place])    takes Risperdal nightly   Blood transfusion    Brain tumor ([place])    Cancer ([place])    In her gum   Carpal tunnel syndrome of right wrist 05/23/2011   Cervical disc disorder with radiculopathy of cervical region 10/31/2012   Chronic back pain    Chronic idiopathic constipation    Chronic neck and back pain    Colon polyps    COPD (chronic obstructive pulmonary disease) with chronic bronchitis ([place]) 09/16/2013   Office Spirometry 10/30/2013-submaximal effort based on appearance of loop and curve. Numbers would fit with severe restriction but her physiologic capability may be better than this. FVC 0.91/44%, and 10.74/45%, FEV1/FVC 0.81, FEF 25-75% 1.43/69%     Diabetes mellitus    Type II   Diverticulosis    TCS 9/08 by Dr. Delfin Edis for diarrhea . Bx for micro scopic colitis negative.    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   Metabolic encephalopathy 5/36/1443   Migraines    chronic headaches   Mononeuritis lower limb    Narcolepsy    Osteoporosis    Pancreatitis 2006   due to Depakote with normal EUS    Schatzki's ring    non critical / EGD with  ED 8/2011with RMR   Seizures ([place])    takes Lamictal daily.Last seizure 3 yrs ago   Sleep apnea    on CPAP   Small bowel obstruction (HCC)    Stroke ([place])    left sided weakness, speech changes   Tubular adenoma of colon     Past Surgical History:  Procedure Laterality Date   ABDOMINAL HYSTERECTOMY  1978   BACK SURGERY  July 2012   BACTERIAL OVERGROWTH TEST N/A 05/05/2013   Procedure: BACTERIAL OVERGROWTH TEST;  Surgeon: Daneil Dolin, MD;  [facility];  Service: Endoscopy;  Laterality: N/A;  7:30   BIOPSY THYROID  2009   BRAIN SURGERY  11/2011   resection of meningioma   BREAST REDUCTION SURGERY  1994   CARDIAC CATHETERIZATION  05/10/2005   normal coronaries, normal LV systolic function and EF (Dr. Jackie Plum)   [place] Left 07/22/04   Dr. Aline Brochure   CATARACT EXTRACTION Bilateral    CHOLECYSTECTOMY  1984   COLONOSCOPY N/A 09/25/2012   XVQ:MGQQPYP diverticulosis.  colonic polyp-removed : tubular adenoma   CRANIOTOMY  11/23/2011   Procedure: CRANIOTOMY TUMOR EXCISION;  Surgeon: Hosie Spangle, MD;  Location: [place] [facility] OR[facility];  Service: Neurosurgery;  Laterality: N/A;  Craniotomy for tumor resection   ESOPHAGOGASTRODUODENOSCOPY  12/29/2010   [place]-Retained food in the esophagus and stomach, small hiatal hernia, status post Maloney dilation of the esophagus   ESOPHAGOGASTRODUODENOSCOPY N/A 09/25/2012   PJK:DTOIZTIW atonic baggy esophagus status post Maloney dilation 61 F. Hiatal hernia   GIVENS CAPSULE STUDY N/A 01/15/2013   NORMAL.    IR GENERIC HISTORICAL  03/17/2016   IR RADIOLOGIST EVAL & MGMT 03/17/2016 [facility]INTER[facility] RAD   LESION REMOVAL N/A 05/31/2015   Procedure: REMOVAL RIGHT AND LEFT LESIONS OF MANDIBLE;  Surgeon: Diona Browner, DDS;  Location: [place];  Service: Oral Surgery;  Laterality: N/A;   MALONEY DILATION  12/29/2010   RMR;   NM MYOCAR PERF WALL MOTION  2006   "relavtiely normal" persantine, mild anterior thinning (breast attenuation artifact), no region  of scar/ischemia   OVARIAN CYST REMOVAL     RECTOCELE REPAIR N/A 06/29/2015   Procedure: POSTERIOR REPAIR (RECTOCELE);  Surgeon: Jonnie Kind, MD;  Location: [facility];  Service: Gynecology;  Laterality: N/A;   REDUCTION MAMMAPLASTY Bilateral    SPINE SURGERY  09/29/2010   Dr. Rolena Infante   surgical excision of 3 tumors from right thigh and right buttock  and left upper thigh  2010   TOOTH EXTRACTION Bilateral 12/14/2014   Procedure: REMOVAL OF BILATERAL MANDIBULAR EXOSTOSES;  Surgeon: Diona Browner, DDS;  Location: [facility];  Service: Oral Surgery;  Laterality: Bilateral;   TRANSTHORACIC ECHOCARDIOGRAM  2010   EF 60-65%, mild conc LVH, grade  1 diastolic dysfunction; mildly calcified MV annulus with mildly thickened leaflets, mildly calcified MR annulus    Family Psychiatric History: see below  Family History:  Family History  Problem Relation Age of Onset   Heart attack Mother        HTN   Pneumonia Father    Kidney failure Father    Diabetes Father    Pancreatic cancer Sister    Cancer Sister        breast    Cancer Sister        pancreatic   Diabetes Brother    Hypertension Brother    Diabetes Brother    Alcohol abuse Maternal Uncle    Stroke Maternal Grandmother    Heart attack Maternal Grandfather    Hypertension Son    Sleep apnea Son    Colon cancer Neg Hx    Anesthesia problems Neg Hx    Hypotension Neg Hx    Malignant hyperthermia Neg Hx    Pseudochol deficiency Neg Hx    Breast cancer Neg Hx    Stomach cancer Neg Hx     Social History:  Social History   Socioeconomic History   Marital status: Divorced    Spouse name: Not on file   Number of children: 1   Years of education: 12   Highest education level: High school graduate  Occupational History   Occupation: Disabled  Tobacco Use   Smoking status: Some Days    Years: 30.00    Types: Cigarettes   Smokeless tobacco: Never   Tobacco comments:    quit 7 weeks ago-07/14/2020-[facility]   Vaping Use   Vaping Use:  Never used  Substance and Sexual Activity   Alcohol use: No    Alcohol/week: 0.0 standard drinks    Comment:     Drug use: No   Sexual activity: Not Currently  Other Topics Concern   Not on file  Social History Narrative   01/29/18 Lives alone, has 3 aides, Mon- Fri 8 hrs, 2 hrs on Sat-Sun, RN manages her meds   Caffeine use: Drink coffee sometimes    Right handed    Social Determinants of Radio broadcast assistant Strain: Low Risk    Difficulty of Paying Living Expenses: Not very hard  Food Insecurity: No Food Insecurity   Worried About Charity fundraiser in the Last Year: Never true   [place] in the Last Year: Never true  Transportation Needs: No Transportation Needs   Lack of Transportation (Medical): No   Lack of Transportation (Non-Medical): No  Physical Activity: Inactive   Days of Exercise per Week: 0 days   Minutes of Exercise per Session: 0 min  Stress: Not on file  Social Connections: Moderately Isolated   Frequency of Communication with Friends and Family: Three times a week   Frequency of Social Gatherings with Friends and Family: Twice a week   Attends Religious Services: More than 4 times per year   Active Member of [employer] or Organizations: No   Attends Archivist Meetings: Never   Marital Status: Divorced    Allergies:  Allergies  Allergen Reactions   Lac Bovis Rash    Doesn't agree with stomach.    Phenazopyridine Hcl Hives   Cephalexin Hives   Flonase [Fluticasone]     "It gave me ulcers in my nose"   Iron Nausea And Vomiting    And itching   Milk-Related Compounds Other (See Comments)  Doesn't agree with stomach.    Penicillins Hives    Has patient had a PCN reaction causing immediate rash, facial/tongue/throat swelling, SOB or lightheadedness with hypotension: Yes Has patient had a PCN reaction causing severe rash involving mucus membranes or skin necrosis: No Has patient had a PCN reaction that required hospitalization  No Has patient had a PCN reaction occurring within the last 10 years: No If all of the above answers are "NO", then may proceed with Cephalosporin use.    Phenazopyridine Hives   Phenazopyridine Hcl Hives          Metabolic Disorder Labs: Lab Results  Component Value Date   HGBA1C 5.9 (A) 01/17/2021   MPG 108 09/30/2018   MPG 134 10/15/2016   No results found for: PROLACTIN Lab Results  Component Value Date   CHOL 128 09/01/2020   TRIG 232 (H) 09/01/2020   HDL 40 09/01/2020   CHOLHDL 3.2 09/01/2020   VLDL 26 07/19/2015   LDLCALC 51 09/01/2020   LDLCALC 59 09/23/2019   Lab Results  Component Value Date   TSH 0.631 12/11/2019   TSH 0.94 02/17/2019    Therapeutic Level Labs: Lab Results  Component Value Date   LITHIUM <0.06 (L) 10/15/2016   Lab Results  Component Value Date   VALPROATE <10.0 (L) 08/23/2007   No components found for:  CBMZ  Current Medications: Current Outpatient Medications  Medication Sig Dispense Refill   ACCU-CHEK GUIDE test strip USE TO CHECK BLOOD SUGAR FOUR TIMES A DAY AND AS NEEDED (NEED MD APPOINTMENT FOR ANY FURTHER REFILLS) 400 strip 2   Accu-Chek Softclix Lancets lancets TEST BLOOD SUGAR THREE TIMES DAILY AS DIRECTED 300 each 2   albuterol (VENTOLIN HFA) 108 (90 Base) MCG/ACT inhaler INHALE 1 TO 2 PUFFS EVERY 6 HOURS AS NEEDED FOR WHEEZING, SHORTNESS OF BREATH 3 each 1   Alcohol Swabs (DROPSAFE ALCOHOL PREP) 70 % PADS USE TO CLEAN FINGER PRIOR TO STICKING FOR BLOOD SUGAR. 200 each 0   alendronate (FOSAMAX) 70 MG tablet TAKE 1 TABLET (70 MG TOTAL) BY MOUTH EVERY 7 (SEVEN) DAYS. TAKE WITH A FULL GLASS OF WATER ON AN EMPTY STOMACH. 12 tablet 3   amLODipine (NORVASC) 10 MG tablet TAKE 1 TABLET EVERY DAY (Patient taking differently: Take 10 mg by mouth at bedtime.) 90 tablet 1   ascorbic acid (VITAMIN C) 500 MG tablet Take 500 mg by mouth daily.     aspirin EC 81 MG tablet Take 1 tablet (81 mg total) by mouth daily with breakfast. 120 tablet 2    Blood Glucose Calibration (ACCU-CHEK GUIDE CONTROL) LIQD USE AS DIRECTED 1 each 0   blood glucose meter kit and supplies Dispense based on patient and insurance preference. Use up to four times daily as directed. (FOR ICD-10 E10.9, E11.9). 1 each 0   buPROPion (WELLBUTRIN XL) 150 MG 24 hr tablet Take 1 tablet (150 mg total) by mouth every morning. 90 tablet 2   cetirizine (ZYRTEC) 10 MG tablet Take 1 tablet (10 mg total) by mouth daily. 7 tablet 0   Clobetasol Prop Emollient Base (CLOBETASOL PROPIONATE E) 0.05 % emollient cream Apply to affected area qd 60 g 6   Continuous Blood Gluc Sensor (FREESTYLE LIBRE 14 DAY SENSOR) MISC 1 each by Does not apply route every 14 (fourteen) days. Change every 2 weeks (Patient not taking: Reported on 03/24/2021) 2 each 11   diclofenac Sodium (VOLTAREN) 1 % GEL APPLY 2 GRAMS  TOPICALLY FOUR TIMES DAILY. (Patient taking  differently: Apply 2 g topically 4 (four) times daily.) 700 g 1   DROPLET PEN NEEDLES 31G X 8 MM MISC USE FOR INJECTING INSULIN 4 TIMES DAILY. 400 each 0   ezetimibe (ZETIA) 10 MG tablet TAKE 1 TABLET EVERY DAY (Patient taking differently: Take 10 mg by mouth daily.) 90 tablet 1   famotidine (PEPCID) 40 MG tablet Take 40 mg by mouth 2 (two) times daily.     hydrOXYzine (ATARAX/VISTARIL) 10 MG tablet Take 1 tablet (10 mg total) by mouth 3 (three) times daily as needed. (Patient taking differently: Take 10 mg by mouth 3 (three) times daily as needed for itching or anxiety.) 30 tablet 0   insulin glargine, 2 Unit Dial, (TOUJEO MAX SOLOSTAR) 300 UNIT/ML Solostar Pen Inject 24 Units into the skin at bedtime. (Patient taking differently: Inject 28 Units into the skin at bedtime.) 9 mL 1   lamoTRIgine (LAMICTAL) 100 MG tablet Take 1 tablet (100 mg total) by mouth 2 (two) times daily. 180 tablet 2   levothyroxine (SYNTHROID) 50 MCG tablet Take 1 tablet (50 mcg) 6 days 1/2 tablet (25 mcg) 1 day weekly. 80 tablet 3   LORazepam (ATIVAN) 0.5 MG tablet Take 1  tablet (0.5 mg total) by mouth 2 (two) times daily. 60 tablet 2   losartan (COZAAR) 100 MG tablet Take 1 tablet (100 mg total) by mouth daily. (Patient taking differently: Take 100 mg by mouth at bedtime.) 30 tablet 2   meclizine (ANTIVERT) 25 MG tablet Take 1 tablet (25 mg total) by mouth 3 (three) times daily as needed for dizziness. 30 tablet 0   methocarbamol (ROBAXIN) 500 MG tablet Take one tablet by mouth two times daily , as needed, for neck and back pain and spasm (Patient taking differently: Take 500 mg by mouth 2 (two) times daily as needed for muscle spasms (for neck and back pain and spasm).) 20 tablet 0   metoprolol tartrate (LOPRESSOR) 50 MG tablet TAKE 1 TABLET TWICE DAILY (NEED MD APPOINTMENT) (Patient taking differently: Take 50 mg by mouth 2 (two) times daily.) 180 tablet 3   mirabegron ER (MYRBETRIQ) 25 MG TB24 tablet Take 1 tablet (25 mg total) by mouth daily. 90 tablet 1   Misc. Devices (MATTRESS PAD) MISC FIRM MATTRESS PAD for hospital bed x 1 1 each 0   montelukast (SINGULAIR) 10 MG tablet TAKE 1 TABLET EVERY DAY (Patient taking differently: Take 10 mg by mouth daily.) 90 tablet 1   NOVOLOG FLEXPEN 100 UNIT/ML FlexPen INJECT 16 TO 18 UNITS UNDER SKIN UP TO 3 TIMES A DAY BEFORE MEALS  (DISCARD PEN 28 DAYS AFTER OPENING) 60 mL 0   nystatin (MYCOSTATIN/NYSTOP) powder APPLY TO AFFECTED AREA 4 TIMES DAILY. 90 g 1   Omega-3 Fatty Acids (FISH OIL PO) Take 1 capsule by mouth daily.     oxyCODONE-acetaminophen (PERCOCET) 10-325 MG tablet Take 1 tablet by mouth every 8 (eight) hours as needed for pain.     Plecanatide (TRULANCE) 3 MG TABS Take 3 mg by mouth daily as needed. 30 tablet 0   pregabalin (LYRICA) 75 MG capsule Take 1 capsule (75 mg total) by mouth daily.     RABEprazole (ACIPHEX) 20 MG tablet TAKE 1 TABLET TWICE DAILY 180 tablet 0   RESTASIS 0.05 % ophthalmic emulsion Place 2 drops into both eyes daily.     risperiDONE (RISPERDAL) 0.5 MG tablet Take 1 tablet (0.5 mg total) by  mouth at bedtime. 90 tablet 2   rosuvastatin (CRESTOR) 5  MG tablet TAKE 1 TABLET AT BEDTIME 90 tablet 2   sertraline (ZOLOFT) 100 MG tablet Take 1 tablet (100 mg total) by mouth daily. 90 tablet 2   spironolactone (ALDACTONE) 25 MG tablet Take 1 tablet (25 mg total) by mouth daily. 30 tablet 2   STIOLTO RESPIMAT 2.5-2.5 MCG/ACT AERS INHALE 2 PUFFS INTO THE LUNGS DAILY. (Patient taking differently: Inhale 2 puffs into the lungs daily.) 12 g 1   traZODone (DESYREL) 150 MG tablet Take 1 tablet (150 mg total) by mouth at bedtime. 90 tablet 2   UNABLE TO [facility] bed mattress x 1  DX: G47.33, J44.9 1 each 0   UNABLE TO FIND Standard wheelchair Dx M47.16, R29.898 1 each 0   UNABLE TO FIND Med Name: Activice cooling gel spray on arms, legs, and back     UNABLE TO [facility] bed with orthopedic mattress x 1 DX G47.33, G89.4 1 each 0   No current facility-administered medications for this visit.     Musculoskeletal: Strength & Muscle Tone: na Gait & Station: na Patient leans: N/A  Psychiatric Specialty Exam: Review of Systems  Musculoskeletal:  Positive for arthralgias, back pain and myalgias.  All other systems reviewed and are negative.  There were no vitals taken for this visit.There is no height or weight on file to calculate BMI.  General Appearance: NA  Eye Contact:  NA  Speech:  Clear and Coherent  Volume:  Normal  Mood:  Euthymic  Affect:  na  Thought Process:  NA  Orientation:  Full (Time, Place, and Person)  Thought Content: WDL   Suicidal Thoughts:  No  Homicidal Thoughts:  No  Memory:  Immediate;   Good Recent;   Fair Remote;   NA  Judgement:  Fair  Insight:  Shallow  Psychomotor Activity:  Decreased  Concentration:  Concentration: Fair and Attention Span: Fair  Recall:  [employer] of Knowledge: Fair  Language: Good  Akathisia:  No  Handed:  Right  AIMS (if indicated): not done  Assets:  Communication Skills Desire for Improvement Resilience Social  Support Talents/Skills  ADL's:  Intact  Cognition: WNL  Sleep:  Good   Screenings: Mini-Mental    Flowsheet Row Office Visit from 11/24/2020 in [facility] Office Visit from 02/03/2019 in [facility] Office Visit from 09/08/2015 in [facility] from 07/02/2014 in [facility] from 12/08/2013 in [facility]  Total Score (max 30 points ) 23 27 24 26 27       PHQ2-9    [place] Visit from 03/04/2021 in [facility] Video Visit from 01/13/2021 in [facility] Office Visit from 11/10/2020 in [facility] Office Visit from 10/20/2020 in [facility] Visit from 10/15/2020 in [facility]  PHQ-2 Total Score 1 2 2  0 0  PHQ-9 Total Score -- 7 8 0 --      Flowsheet Row ED from 11/24/2020 in [facility] from 10/16/2020 in [facility] Video Visit from 10/13/2020 in [facility] No Risk No Risk No Risk        Assessment and Plan: This patient is a 71 year old female with a long history of depression and anxiety and mood swings.  She seems to be doing fairly well on her current regimen.  She will continue trazodone 150 mg at bedtime for sleep, Wellbutrin XL  150 mg daily as well as Zoloft 100 mg daily for depression, Lamictal 100 mg twice daily for mood stabilization, Ativan 0.5 mg twice daily for anxiety and Risperdal 0.5 mg at bedtime for mood stabilization.  She will return to see me in 3 months   Levonne Spiller, MD 04/14/2021, 3:49 PM

## 2021-04-18 ENCOUNTER — Other Ambulatory Visit (HOSPITAL_COMMUNITY): Payer: Self-pay | Admitting: Nephrology

## 2021-04-18 ENCOUNTER — Other Ambulatory Visit: Payer: Self-pay | Admitting: Nephrology

## 2021-04-18 DIAGNOSIS — E1122 Type 2 diabetes mellitus with diabetic chronic kidney disease: Secondary | ICD-10-CM

## 2021-04-18 DIAGNOSIS — Z79899 Other long term (current) drug therapy: Secondary | ICD-10-CM | POA: Diagnosis not present

## 2021-04-18 DIAGNOSIS — D638 Anemia in other chronic diseases classified elsewhere: Secondary | ICD-10-CM

## 2021-04-18 DIAGNOSIS — N189 Chronic kidney disease, unspecified: Secondary | ICD-10-CM | POA: Diagnosis not present

## 2021-04-18 DIAGNOSIS — I129 Hypertensive chronic kidney disease with stage 1 through stage 4 chronic kidney disease, or unspecified chronic kidney disease: Secondary | ICD-10-CM | POA: Diagnosis not present

## 2021-04-18 DIAGNOSIS — Z6834 Body mass index (BMI) 34.0-34.9, adult: Secondary | ICD-10-CM | POA: Diagnosis not present

## 2021-04-18 DIAGNOSIS — I5032 Chronic diastolic (congestive) heart failure: Secondary | ICD-10-CM | POA: Diagnosis not present

## 2021-04-18 DIAGNOSIS — G4733 Obstructive sleep apnea (adult) (pediatric): Secondary | ICD-10-CM | POA: Diagnosis not present

## 2021-04-18 DIAGNOSIS — G8929 Other chronic pain: Secondary | ICD-10-CM | POA: Diagnosis not present

## 2021-04-18 DIAGNOSIS — Z716 Tobacco abuse counseling: Secondary | ICD-10-CM | POA: Diagnosis not present

## 2021-04-20 DIAGNOSIS — I5032 Chronic diastolic (congestive) heart failure: Secondary | ICD-10-CM | POA: Diagnosis not present

## 2021-04-20 DIAGNOSIS — E1122 Type 2 diabetes mellitus with diabetic chronic kidney disease: Secondary | ICD-10-CM | POA: Diagnosis not present

## 2021-04-20 DIAGNOSIS — D638 Anemia in other chronic diseases classified elsewhere: Secondary | ICD-10-CM | POA: Diagnosis not present

## 2021-04-20 DIAGNOSIS — I129 Hypertensive chronic kidney disease with stage 1 through stage 4 chronic kidney disease, or unspecified chronic kidney disease: Secondary | ICD-10-CM | POA: Diagnosis not present

## 2021-04-20 DIAGNOSIS — G4733 Obstructive sleep apnea (adult) (pediatric): Secondary | ICD-10-CM | POA: Diagnosis not present

## 2021-04-20 DIAGNOSIS — N189 Chronic kidney disease, unspecified: Secondary | ICD-10-CM | POA: Diagnosis not present

## 2021-04-21 ENCOUNTER — Ambulatory Visit: Payer: Self-pay

## 2021-04-21 ENCOUNTER — Telehealth: Payer: Medicare HMO

## 2021-04-21 DIAGNOSIS — M5412 Radiculopathy, cervical region: Secondary | ICD-10-CM | POA: Diagnosis not present

## 2021-04-21 DIAGNOSIS — M25512 Pain in left shoulder: Secondary | ICD-10-CM | POA: Diagnosis not present

## 2021-04-21 DIAGNOSIS — M961 Postlaminectomy syndrome, not elsewhere classified: Secondary | ICD-10-CM | POA: Diagnosis not present

## 2021-04-25 ENCOUNTER — Telehealth: Payer: Self-pay | Admitting: *Deleted

## 2021-04-25 ENCOUNTER — Ambulatory Visit (INDEPENDENT_AMBULATORY_CARE_PROVIDER_SITE_OTHER): Payer: Medicare HMO | Admitting: Nurse Practitioner

## 2021-04-25 ENCOUNTER — Other Ambulatory Visit: Payer: Self-pay

## 2021-04-25 ENCOUNTER — Ambulatory Visit: Payer: Medicare HMO

## 2021-04-25 ENCOUNTER — Inpatient Hospital Stay: Payer: Medicare HMO

## 2021-04-25 ENCOUNTER — Telehealth: Payer: Self-pay | Admitting: Internal Medicine

## 2021-04-25 ENCOUNTER — Encounter: Payer: Self-pay | Admitting: Nurse Practitioner

## 2021-04-25 DIAGNOSIS — J42 Unspecified chronic bronchitis: Secondary | ICD-10-CM | POA: Diagnosis not present

## 2021-04-25 DIAGNOSIS — R051 Acute cough: Secondary | ICD-10-CM | POA: Diagnosis not present

## 2021-04-25 DIAGNOSIS — F250 Schizoaffective disorder, bipolar type: Secondary | ICD-10-CM | POA: Diagnosis not present

## 2021-04-25 DIAGNOSIS — M160 Bilateral primary osteoarthritis of hip: Secondary | ICD-10-CM | POA: Diagnosis not present

## 2021-04-25 DIAGNOSIS — J449 Chronic obstructive pulmonary disease, unspecified: Secondary | ICD-10-CM | POA: Diagnosis not present

## 2021-04-25 DIAGNOSIS — E1142 Type 2 diabetes mellitus with diabetic polyneuropathy: Secondary | ICD-10-CM | POA: Diagnosis not present

## 2021-04-25 DIAGNOSIS — F17210 Nicotine dependence, cigarettes, uncomplicated: Secondary | ICD-10-CM | POA: Diagnosis not present

## 2021-04-25 DIAGNOSIS — J019 Acute sinusitis, unspecified: Secondary | ICD-10-CM

## 2021-04-25 DIAGNOSIS — J329 Chronic sinusitis, unspecified: Secondary | ICD-10-CM | POA: Insufficient documentation

## 2021-04-25 DIAGNOSIS — D649 Anemia, unspecified: Secondary | ICD-10-CM | POA: Diagnosis not present

## 2021-04-25 DIAGNOSIS — R2681 Unsteadiness on feet: Secondary | ICD-10-CM | POA: Diagnosis not present

## 2021-04-25 DIAGNOSIS — E039 Hypothyroidism, unspecified: Secondary | ICD-10-CM | POA: Diagnosis not present

## 2021-04-25 LAB — POCT INFLUENZA A/B
Influenza A, POC: NEGATIVE
Influenza B, POC: NEGATIVE

## 2021-04-25 MED ORDER — BENZONATATE 100 MG PO CAPS
100.0000 mg | ORAL_CAPSULE | Freq: Two times a day (BID) | ORAL | 0 refills | Status: DC | PRN
Start: 2021-04-25 — End: 2021-06-02

## 2021-04-25 MED ORDER — CETIRIZINE HCL 10 MG PO TABS: 10.0000 mg | ORAL_TABLET | Freq: Every day | ORAL | 1 refills | Status: DC

## 2021-04-25 NOTE — Telephone Encounter (Signed)
Patient called this morning to report she is having issues going to the bathroom.  She is having a lot of pain in her side and stomach.  The only way she can have a BM is to take a laxative and then once it's done she's back constipated again.  Please call patient and advise.  Thank you.

## 2021-04-25 NOTE — Progress Notes (Signed)
Virtual Visit via Telephone Note  I connected with Karisma Meiser on 04/25/21 at 10:38 by telephone and verified that I am speaking with the correct person using two identifiers. I spent 6 minutes talking to the pt and reviewing her chart   Location: Patient: home Provider: office   I discussed the limitations, risks, security and privacy concerns of performing an evaluation and management service by telephone and the availability of in person appointments. I also discussed with the patient that there may be a patient responsible charge related to this service. The patient expressed understanding and agreed to proceed.   History of Present Illness: Pt c/o congestion,no productive cough,clear nasal drainage, sinus pressure, post nasal drip that stared 3 days ago, denies sneezing,stuffy nose, fever, chills, bodyaches , wheezing, sob. Uses inhaler for wheezing. Has no known exposure to covid or flu. Has had flu and covid. She has used OTC allergy and cold medicine. Both medication did not help    Observations/Objective:   Assessment and Plan: Cough. Tessalon 100mg  BID PRN, test for covid and flu  Acute Sinusitis. Zyrtec 10mg  daily              Pt is allergic to  flonase   Follow Up Instructions:    I discussed the assessment and treatment plan with the patient. The patient was provided an opportunity to ask questions and all were answered. The patient agreed with the plan and demonstrated an understanding of the instructions.   The patient was advised to call back or seek an in-person evaluation if the symptoms worsen or if the condition fails to improve as anticipated.

## 2021-04-25 NOTE — Addendum Note (Signed)
Addended by: Quentin Angst on: 04/25/2021 03:13 PM   Modules accepted: Orders

## 2021-04-25 NOTE — Assessment & Plan Note (Signed)
Tessalon 100mg  BID PRN PT will come to the office for flu and covid test.

## 2021-04-25 NOTE — Progress Notes (Signed)
Flu negative, pls notify pt . Take tessalon 100mg  BID prn for cough, zyrtec for allergies, covid test is pending. Thanks

## 2021-04-25 NOTE — Telephone Encounter (Signed)
[facility] attempted to call patient to schedule [facility] for patient [name] 2 no return calls.  Attempted to call patient myself to explain that visit with Dr Mickeal Skinner would need to be canceled until MRI was completed.  LMOM pending call back.  Appointment canceled.

## 2021-04-25 NOTE — Assessment & Plan Note (Addendum)
Zyrtec 10mg  daily Allergic to  flonase

## 2021-04-25 NOTE — Telephone Encounter (Signed)
The pt has complaints of abd pain and constipation despite trulance as needed.  She did take 3 mg daily and had a good BM over the weekend but she quickly became constipated again.  She would like to see Dr Hilarie Fredrickson to discuss "he knows my condition" but his first appt is not until 3/22 (cancellation). She says she can not wait for that appt and wants to see a PA.  Appt has been made with Ellouise Newer on 2/16.      08/10/20 OFFICE  NOTE:  Chronic constipation/abdominal bloating/pelvic floor dysfunction --it is apparent that her pelvic floor dysfunction and rectocele are significantly contributing to her defecation dysfunction as well as her alternating bowel habits.  She has previously tried Amitiza and Linzess which caused urgency and accidents.  Trulance seems to work better than those 2 but still causes loose stool when taken every day.  We discussed her symptoms at length and I recommended the following: -- Discontinue daily Trulance; this could still be used 3 mg once daily for significant constipation but more on an as-needed basis given her response -- Begin Citrucel 1 tablespoon once to twice daily; hopefully the higher fiber will help prevent the hard stool but also loose stools. -- Oxycodone is exacerbating her constipation but needed, if this becomes more of a daily medication we could try Movantik  11/04/20 Colonoscopy report Diverticulosis in the sigmoid colon, in the descending colon, at the hepatic flexure and in the ascending colon. - The examination was otherwise normal on direct and retroflexion views. - No specimens collected.

## 2021-04-26 ENCOUNTER — Inpatient Hospital Stay: Payer: Medicare HMO | Admitting: Internal Medicine

## 2021-04-26 ENCOUNTER — Telehealth: Payer: Self-pay | Admitting: *Deleted

## 2021-04-26 ENCOUNTER — Telehealth: Payer: Self-pay | Admitting: Family Medicine

## 2021-04-26 NOTE — Telephone Encounter (Signed)
Pt called in regard to tele appt 2/13   Pt states is not feeling well at all  Mucus running down back of throat  Pulling sensation in chest  L side of head pain , shooting pain then goes away   Pt thinks may be covid   Wants a call back to discuss.

## 2021-04-26 NOTE — Chronic Care Management (AMB) (Signed)
°  Care Management   Note  04/26/2021 Name: [name] MRN: [id number] DOB: [birth year]  [name] is a 71 y.o. year old female who is a primary care patient of [name], MD and is actively engaged with the care management team. I reached out to [name] by phone today to assist with re-scheduling an initial visit with the RN Case Manager  Follow up plan: Unsuccessful telephone outreach attempt made. A HIPAA compliant phone message was left for the patient providing contact information and requesting a return call.  The care management team will reach out to the patient again over the next 7 days.  If patient returns call to provider office, please advise to call [place] at [phone].  [facility]  Direct Dial: [phone]

## 2021-04-27 ENCOUNTER — Ambulatory Visit (INDEPENDENT_AMBULATORY_CARE_PROVIDER_SITE_OTHER): Payer: Medicare HMO | Admitting: Pharmacist

## 2021-04-27 ENCOUNTER — Other Ambulatory Visit: Payer: Self-pay | Admitting: Nurse Practitioner

## 2021-04-27 DIAGNOSIS — E1143 Type 2 diabetes mellitus with diabetic autonomic (poly)neuropathy: Secondary | ICD-10-CM

## 2021-04-27 DIAGNOSIS — E785 Hyperlipidemia, unspecified: Secondary | ICD-10-CM

## 2021-04-27 DIAGNOSIS — R0989 Other specified symptoms and signs involving the circulatory and respiratory systems: Secondary | ICD-10-CM

## 2021-04-27 DIAGNOSIS — F17210 Nicotine dependence, cigarettes, uncomplicated: Secondary | ICD-10-CM

## 2021-04-27 DIAGNOSIS — U071 COVID-19: Secondary | ICD-10-CM

## 2021-04-27 DIAGNOSIS — N1831 Chronic kidney disease, stage 3a: Secondary | ICD-10-CM

## 2021-04-27 DIAGNOSIS — F060 Psychotic disorder with hallucinations due to known physiological condition: Secondary | ICD-10-CM

## 2021-04-27 LAB — NOVEL CORONAVIRUS, NAA: SARS-CoV-2, NAA: DETECTED — AB

## 2021-04-27 MED ORDER — NIRMATRELVIR/RITONAVIR (PAXLOVID) TABLET (RENAL DOSING): 2.0000 | ORAL_TABLET | Freq: Two times a day (BID) | ORAL | 0 refills | Status: DC

## 2021-04-27 MED ORDER — NIRMATRELVIR/RITONAVIR (PAXLOVID) TABLET (RENAL DOSING): 2.0000 | ORAL_TABLET | Freq: Two times a day (BID) | ORAL | 0 refills | Status: AC

## 2021-04-27 NOTE — Progress Notes (Signed)
Please review results with patient, she is got COVID.  I have sent in a prescription for Paxlovid to patient's pharmacy, patient should pick up medication and use it for 5 days.  Thank you.

## 2021-04-27 NOTE — Telephone Encounter (Signed)
Patient aware of covid results and will have med delivered to her

## 2021-04-27 NOTE — Chronic Care Management (AMB) (Signed)
Chronic Care Management Pharmacy Note  04/27/2021 Name:  [name] MRN:  [id number] DOB:  01-07-1951  Summary: General: Patient reports not feeling well. Currently has Covid. Is aware to start Paxlovid as prescribed. Removed multiple duplicate medications from medication list today  Type 2 Diabetes Recent change: [name] had patient increase Toujeo to 32 units at bedtime due to elevated blood glucose Emergency hypoglycemia treatment:  recommend that endocrinology consider prescribing glucagon due to tight blood glucose control and high risk for level 2-3 hypoglycemia Current glucose readings: Unsure of many readings lately but patient reports elevated blood glucose despite poor appetite. Continue current medications as above per endocrinology. Need to make sure she understands how many units to inject. May not need such tight glycemic control based on other health factors  Chronic Kidney Disease Patient recently had initial evaluation with Dr. Theador Hawthorne (nephrology). He has ordered several labs/testing. Most recent GFR: 65 mL/min (04/20/21) Recommend adequate hydration  Patient counseled on smoking cessation Continue follow-up with nephrology   Subjective: [name] [employer] is an 71 y.o. year old female who is a primary patient of Moshe Cipro, Norwood Levo, MD.  The CCM team was consulted for assistance with disease management and care coordination needs.    Engaged with patient by telephone for follow up visit in response to provider referral for pharmacy case management and/or care coordination services.   Consent to Services:  The patient was given information about Chronic Care Management services, agreed to services, and gave verbal consent prior to initiation of services.  Please see initial visit note for detailed documentation.   Patient Care Team: [name], MD as PCP - General Bronson Ing Lenise Herald, MD (Inactive) as PCP - Cardiology (Cardiology) Gala Romney Cristopher Estimable, MD  (Gastroenterology) Deneise Lever, MD as Consulting Physician (Pulmonary Disease) Jovita Gamma, MD as Consulting Physician (Neurosurgery) Suella Broad, MD as Consulting Physician (Physical Medicine and Rehabilitation) Pieter Partridge, DO as Consulting Physician (Neurology) Lavonna Monarch, MD as Consulting Physician (Dermatology) Leta Baptist, MD as Consulting Physician (Otolaryngology) Latanya Maudlin, MD as Consulting Physician (Orthopedic Surgery) Cloria Spring, MD as Consulting Physician ([place]) Melina Schools, MD as Consulting Physician (Orthopedic Surgery) Lendon Colonel, NP as Nurse Practitioner (Nurse Practitioner) Milus Banister, MD as Attending Physician (Gastroenterology) Pyrtle, Lajuan Lines, MD as Consulting Physician (Gastroenterology) Monico Hoar, PT as Physical Therapist (Physical Therapy) Conrad Nuevo, MD as Consulting Physician (Vascular Surgery) Inocencio Homes, DPM as Consulting Physician (Podiatry) Newt Minion, MD as Consulting Physician (Orthopedic Surgery) Lavonna Monarch, MD as Consulting Physician (Dermatology) Beryle Lathe, [facility] (Pharmacist)  Objective:  Lab Results  Component Value Date   CREATININE 1.23 (H) 12/23/2020   CREATININE 1.10 (H) 11/24/2020   CREATININE 0.98 11/18/2020    Lab Results  Component Value Date   HGBA1C 5.9 (A) 01/17/2021   Last diabetic Eye exam:  Lab Results  Component Value Date/Time   HMDIABEYEEXA No Retinopathy 05/26/2020 12:00 AM    Last diabetic Foot exam:  Lab Results  Component Value Date/Time   HMDIABFOOTEX yes 09/14/2009 12:00 AM        Component Value Date/Time   CHOL 128 09/01/2020 1444   TRIG 232 (H) 09/01/2020 1444   HDL 40 09/01/2020 1444   CHOLHDL 3.2 09/01/2020 1444   CHOLHDL 3.5 09/30/2018 1143   VLDL 26 07/19/2015 0925   LDLCALC 51 09/01/2020 1444   [place] 79 09/30/2018 1143    Hepatic Function Latest Ref Rng & Units  12/23/2020 11/24/2020 11/18/2020  Total Protein 6.5  - 8.1 g/dL 7.1 7.8 7.2  Albumin 3.5 - 5.0 g/dL 3.8 4.3 4.5  AST 15 - 41 U/L _0 ALT 0 - 44 U/L _1 Alk Phosphatase 38 - 126 U/L 78 108 93  Total Bilirubin 0.3 - 1.2 mg/dL 0.6 0.2(L) 0.3  Bilirubin, Direct 0.1 - 0.5 mg/dL - - -    Lab Results  Component Value Date/Time   TSH 0.631 12/11/2019 12:25 PM   TSH 0.94 02/17/2019 04:26 PM   FREET4 0.78 02/17/2019 04:26 PM   FREET4 0.98 10/08/2017 03:59 PM    CBC Latest Ref Rng & Units 12/23/2020 11/24/2020 11/18/2020  WBC 4.0 - 10.5 K/uL 5.6 5.8 7.2  Hemoglobin 12.0 - 15.0 g/dL 10.5(L) 11.3(L) 10.8(L)  Hematocrit 36.0 - 46.0 % 36.5 39.6 37.9  Platelets 150 - 400 K/uL 254 235 253    Lab Results  Component Value Date/Time   VD25OH 45.5 09/01/2020 02:44 PM   VD25OH 44.60 01/28/2020 12:24 PM    Clinical ASCVD: No  The ASCVD Risk score (Arnett DK, et al., 2019) failed to calculate for the following reasons:   The valid total cholesterol range is 130 to 320 mg/dL    Social History   Tobacco Use  Smoking Status Some Days   Years: 30.00   Types: Cigarettes  Smokeless Tobacco Never  Tobacco Comments   quit 7 weeks ago-07/14/2020-AH    BP Readings from Last 3 Encounters:  03/04/21 (!) 147/71  01/21/21 (!) 136/54  01/17/21 130/82   Pulse Readings from Last 3 Encounters:  03/04/21 75  01/21/21 61  01/17/21 60   Wt Readings from Last 3 Encounters:  03/04/21 171 lb 1.3 oz (77.6 kg)  01/21/21 168 lb (76.2 kg)  01/17/21 167 lb 3.2 oz (75.8 kg)    Assessment: Review of patient past medical history, allergies, medications, health status, including review of consultants reports, laboratory and other test data, was performed as part of comprehensive evaluation and provision of chronic care management services.   SDOH:  (Social Determinants of Health) assessments and interventions performed:    CCM Care Plan  Allergies  Allergen Reactions   Lac Bovis Rash    Doesn't agree with stomach.    Phenazopyridine Hcl Hives    Cephalexin Hives   Flonase [Fluticasone]     "It gave me ulcers in my nose"   Iron Nausea And Vomiting    And itching   Milk-Related Compounds Other (See Comments)    Doesn't agree with stomach.    Penicillins Hives    Has patient had a PCN reaction causing immediate rash, facial/tongue/throat swelling, SOB or lightheadedness with hypotension: Yes Has patient had a PCN reaction causing severe rash involving mucus membranes or skin necrosis: No Has patient had a PCN reaction that required hospitalization No Has patient had a PCN reaction occurring within the last 10 years: No If all of the above answers are "NO", then may proceed with Cephalosporin use.    Phenazopyridine Hives   Phenazopyridine Hcl Hives          Medications Reviewed Today     Reviewed by Beryle Lathe, [facility] (Pharmacist) on 04/27/21 at 1135  Med List Status: <None>   Medication Order Taking? Sig Documenting Provider Last Dose Status Informant  ACCU-CHEK GUIDE test strip [id number] Yes USE TO CHECK BLOOD SUGAR FOUR TIMES A DAY AND AS NEEDED (NEED MD APPOINTMENT FOR ANY FURTHER REFILLS) Philemon Kingdom,  MD Taking Active   Accu-Chek Softclix Lancets lancets [id number] Yes TEST BLOOD SUGAR THREE TIMES DAILY AS DIRECTED Philemon Kingdom, MD Taking Active   albuterol (VENTOLIN HFA) 108 (90 Base) MCG/ACT inhaler [id number] Yes INHALE 1 TO 2 PUFFS EVERY 6 HOURS AS NEEDED FOR WHEEZING, SHORTNESS OF Kerby Less, MD Taking Active   Alcohol Swabs (DROPSAFE ALCOHOL PREP) 70 % PADS [id number] Yes USE TO CLEAN FINGER PRIOR TO STICKING FOR BLOOD SUGAR. Philemon Kingdom, MD Taking Active   alendronate (FOSAMAX) 70 MG tablet [id number] Yes TAKE 1 TABLET (70 MG TOTAL) BY MOUTH EVERY 7 (SEVEN) DAYS. TAKE WITH A FULL GLASS OF WATER ON AN EMPTY STOMACH. [name], MD Taking Active   amLODipine (NORVASC) 10 MG tablet [id number] Yes TAKE 1 TABLET EVERY DAY  Patient taking differently: Take 10 mg by mouth at bedtime.    Imogene Burn, PA-C Taking Active   ascorbic acid (VITAMIN C) 500 MG tablet [id number] Yes Take 500 mg by mouth daily. [provider] Taking Active Self  aspirin EC 81 MG tablet [id number] Yes Take 1 tablet (81 mg total) by mouth daily with breakfast. Roxan Hockey, MD Taking Active Self  benzonatate (TESSALON) 100 MG capsule [id number] Yes Take 1 capsule (100 mg total) by mouth 2 (two) times daily as needed for cough. Renee Rival, FNP Taking Active   betamethasone dipropionate 0.05 % cream [id number] Yes betamethasone dipropionate 0.05 % topical cream [provider] Taking Active   Blood Glucose Calibration (ACCU-CHEK GUIDE CONTROL) Yehuda Budd [id number] Yes USE AS DIRECTED Philemon Kingdom, MD Taking Active   blood glucose meter kit and supplies [id number] Yes Dispense based on patient and insurance preference. Use up to four times daily as directed. (FOR ICD-10 E10.9, E11.9). Philemon Kingdom, MD Taking Active Self  buPROPion (WELLBUTRIN XL) 150 MG 24 hr tablet [id number] Yes Take 1 tablet (150 mg total) by mouth every morning. Cloria Spring, MD Taking Active   cetirizine (ZYRTEC) 10 MG tablet [id number] Yes Take 1 tablet (10 mg total) by mouth daily. Renee Rival, FNP Taking Active   Clobetasol Prop Emollient Base (CLOBETASOL PROPIONATE E) 0.05 % emollient cream [id number] Yes Apply to affected area qd Lavonna Monarch, MD Taking Active   Continuous Blood Gluc Sensor (FREESTYLE LIBRE 14 DAY SENSOR) [place] [id number] No 1 each by Does not apply route every 14 (fourteen) days. Change every 2 weeks Philemon Kingdom, MD Unknown Active   diclofenac Sodium (VOLTAREN) 1 % GEL [id number] Yes APPLY 2 GRAMS  TOPICALLY FOUR TIMES DAILY.  Patient taking differently: Apply 2 g topically 4 (four) times daily.   Esterwood, Amy S, PA-C Taking Active   dicyclomine (BENTYL) 10 MG capsule [id number] No Take by mouth. [provider] Unknown Active   Docusate Sodium (DSS) 100 MG  CAPS [id number] No Take by mouth. [provider] Unknown Active   DROPLET PEN NEEDLES 31G X 8 MM MISC [id number] Yes USE FOR INJECTING INSULIN 4 TIMES DAILY. Philemon Kingdom, MD Taking Active   ezetimibe (ZETIA) 10 MG tablet [id number] Yes TAKE 1 TABLET EVERY DAY  Patient taking differently: Take 10 mg by mouth daily.   [name], MD Taking Active   famotidine (PEPCID) 40 MG tablet [id number] Yes Take 40 mg by mouth 2 (two) times daily. [provider] Taking Active   hydroxychloroquine (PLAQUENIL) 200 MG tablet [id number] Yes Take by mouth. [provider] Taking Active   hydrOXYzine (ATARAX/VISTARIL) 10 MG tablet [id number] Yes Take 1 tablet (  10 mg total) by mouth 3 (three) times daily as needed.  Patient taking differently: Take 10 mg by mouth 3 (three) times daily as needed for itching or anxiety.   [name], MD Taking Active   insulin glargine, 2 Unit Dial, (TOUJEO MAX SOLOSTAR) 300 UNIT/ML Solostar Pen [id number] Yes Inject 24 Units into the skin at bedtime.  Patient taking differently: Inject 32 Units into the skin at bedtime.   Philemon Kingdom, MD Taking Active   lamoTRIgine (LAMICTAL) 100 MG tablet [id number] Yes Take 1 tablet (100 mg total) by mouth 2 (two) times daily. Cloria Spring, MD Taking Active   levothyroxine (SYNTHROID) 50 MCG tablet [id number] Yes Take 1 tablet (50 mcg) 6 days 1/2 tablet (25 mcg) 1 day weekly. Philemon Kingdom, MD Taking Active Self  LORazepam (ATIVAN) 0.5 MG tablet [id number] Yes Take 1 tablet (0.5 mg total) by mouth 2 (two) times daily. Cloria Spring, MD Taking Active   losartan (COZAAR) 100 MG tablet [id number] No Take 1 tablet (100 mg total) by mouth daily.  Patient taking differently: Take 100 mg by mouth at bedtime.   [name], MD Unknown Active Self  losartan (COZAAR) 50 MG tablet [id number] No Take 50 mg by mouth daily. [provider] Unknown Active   meclizine (ANTIVERT) 25 MG tablet  [id number] Yes Take 1 tablet (25 mg total) by mouth 3 (three) times daily as needed for dizziness. Noreene Larsson, NP Taking Active Self  methocarbamol (ROBAXIN) 500 MG tablet [id number] Yes Take one tablet by mouth two times daily , as needed, for neck and back pain and spasm  Patient taking differently: Take 500 mg by mouth 2 (two) times daily as needed for muscle spasms (for neck and back pain and spasm).   [name], MD Taking Active   metoprolol tartrate (LOPRESSOR) 50 MG tablet [id number] Yes TAKE 1 TABLET TWICE DAILY (NEED MD APPOINTMENT)  Patient taking differently: Take 50 mg by mouth 2 (two) times daily.   Erma Heritage, PA-C Taking Active   mirabegron ER (MYRBETRIQ) 25 MG TB24 tablet [id number] Yes Take 1 tablet (25 mg total) by mouth daily. [name], MD Taking Active   Misc. Devices (MATTRESS PAD) [place] [id number] Yes FIRM MATTRESS PAD for hospital bed x 1 [name], MD Taking Active   montelukast (SINGULAIR) 10 MG tablet [id number] Yes TAKE 1 TABLET EVERY DAY  Patient taking differently: Take 10 mg by mouth daily.   [name], MD Taking Active   nirmatrelvir/ritonavir EUA, renal dosing, (PAXLOVID) 10 x 150 MG & 10 x 100MG TABS [id number]  Take 2 tablets by mouth 2 (two) times daily for 5 days. (Take nirmatrelvir 150 mg one tablet twice daily for 5 days and ritonavir 100 mg one tablet twice daily for 5 days) Patient GFR is 62 Paseda, Folashade R, FNP  Active   NOVOLOG FLEXPEN 100 UNIT/ML FlexPen [id number] Yes INJECT 16 TO 18 UNITS UNDER SKIN UP TO 3 TIMES A DAY BEFORE MEALS  (DISCARD PEN 28 DAYS AFTER OPENING) Philemon Kingdom, MD Taking Active   nystatin (MYCOSTATIN/NYSTOP) powder [id number] Yes APPLY TO AFFECTED AREA 4 TIMES DAILY. [name], MD Taking Active   Omega-3 Fatty Acids (FISH OIL PO) [id number] Yes Take 1 capsule by mouth daily. [provider] Taking Active Self  oxyCODONE-acetaminophen (PERCOCET) 10-325 MG tablet  [id number] Yes Take 1 tablet by mouth every 8 (eight) hours as needed for pain. [provider] Taking Active Self  Med Note Jim Like Mar 24, 2021 11:28 AM) Patient only using twice daily as needed   Plecanatide (TRULANCE) 3 MG TABS [id number] Yes Take 3 mg by mouth daily as needed. Jerene Bears, MD Taking Active   pregabalin (LYRICA) 75 MG capsule [id number] Yes Take 1 capsule (75 mg total) by mouth daily. Rexene Alberts, MD Taking Active Self  RABEprazole (ACIPHEX) 20 MG tablet [id number] Yes TAKE 1 TABLET TWICE DAILY Pyrtle, Lajuan Lines, MD Taking Active   RESTASIS 0.05 % ophthalmic emulsion [id number] Yes Place 2 drops into both eyes daily. [provider] Taking Active Self  risperiDONE (RISPERDAL) 0.5 MG tablet [id number] Yes Take 1 tablet (0.5 mg total) by mouth at bedtime. Cloria Spring, MD Taking Active   rosuvastatin (CRESTOR) 5 MG tablet [id number] Yes TAKE 1 TABLET AT BEDTIME [name], MD Taking Active   sertraline (ZOLOFT) 100 MG tablet [id number] Yes Take 1 tablet (100 mg total) by mouth daily. Cloria Spring, MD Taking Active   spironolactone (ALDACTONE) 25 MG tablet [id number] Yes Take 1 tablet (25 mg total) by mouth daily. [name], MD Taking Active   STIOLTO RESPIMAT 2.5-2.5 MCG/ACT AERS [id number] Yes INHALE 2 PUFFS INTO THE LUNGS DAILY.  Patient taking differently: Inhale 2 puffs into the lungs daily.   Parrett, Fonnie Mu, NP Taking Active   traZODone (DESYREL) 150 MG tablet [id number] Yes Take 1 tablet (150 mg total) by mouth at bedtime. Cloria Spring, MD Taking Active   UNABLE TO FIND [phone]  Hospital bed mattress x 1  DX: G47.33, J44.9 [name], MD  Active Self  UNABLE TO FIND [phone]  Standard wheelchair Dx M47.16, R29.898 [name], MD  Active Self  UNABLE TO FIND [phone]  Med Name: Activice cooling gel spray on arms, legs, and back [provider]  Active   UNABLE TO FIND  [phone]  Hospital bed with orthopedic mattress x 1 DX G47.33, G89.4 [name], MD  Active   Med List Note Lisette Abu 04/10/17 8115): Medications have been confirmed via pharmacy records [place] All Medications [phone]            Patient Active Problem List   Diagnosis Date Noted   Sinusitis 04/25/2021   Acute cough 04/25/2021   Advanced care planning/counseling discussion 12/23/2020   Head trauma, initial encounter 12/20/2020   Trigger finger, right 12/12/2020   Confusion 11/24/2020   Blurry vision 11/18/2020   Cervical radiculopathy 07/09/2020   Closed fracture of lateral malleolus of right fibula 02/27/2020   Headache 02/19/2020   Tubular adenoma of colon 02/09/2019   Benign neoplasm of cerebral meninges ([place]) 11/12/2018   Benign meningioma of brain ([place]) 10/31/2018   Osteoarthritis of both hips 07/06/2018   Lipoma of extremity 12/31/2017   Impingement syndrome of left shoulder region 12/27/2017   Leg weakness, bilateral 10/28/2017   Lumbar spondylosis with myelopathy 10/12/2017   Numbness of hand 10/10/2017   Chronic neck pain 07/25/2017   Chronic pain syndrome 07/25/2017   At risk for cardiovascular event 06/10/2017   Diabetic polyneuropathy associated with type 2 diabetes mellitus ([place]) 05/19/2016   Obesity (BMI 30.0-34.9) 04/24/2016   History of palpitations 08/09/2015   Labile hypertension 08/03/2015   Normal coronary arteries 08/03/2015   Dizziness 07/15/2015   Left-sided low back pain with left-sided sciatica 06/27/2015   Multinodular goiter 05/06/2015   Rectocele, female 04/27/2015   Anal sphincter incontinence  04/27/2015   Pelvic relaxation due to rectocele 03/30/2015   Pulmonary hypertension ([place]) 02/22/2015   Posterior chest pain 02/21/2015   Episodic cigarette smoking dependence 01/11/2015   Migraine without aura and without status migrainosus, not intractable 07/02/2014   Flatulence 02/18/2014    Microcytic anemia 02/18/2014   COPD mixed type ([place]) 09/16/2013   Hypothyroidism 08/16/2013   Gastroparesis 04/28/2013   Seizure disorder ([place]) 01/19/2013   Displacement of cervical intervertebral disc without myelopathy 12/13/2012   Bursitis of shoulder 12/13/2012   Cervical disc disorder with radiculopathy of cervical region 10/31/2012   Solitary pulmonary nodule 08/19/2012   Anemia 07/05/2012   Hypersomnia disorder related to a known organic factor 06/11/2012   Pruritus 04/18/2012   Meningioma ([place]) 11/19/2011   Mononeuritis leg 10/25/2011   Carpal tunnel syndrome of right wrist 05/23/2011   Chronic pain of right hand 05/04/2011   Polypharmacy 04/28/2011   Bipolar disorder ([place]) 04/28/2011   Constipation 04/13/2011   Recurrent falls 12/12/2010   Oropharyngeal dysphagia 07/12/2010   Urinary incontinence 12/16/2009   HEARING LOSS 10/26/2009   Well controlled type 2 diabetes mellitus with gastroparesis ([place]) 07/07/2009   Hyperlipidemia 12/11/2008   IBS 12/11/2008   GERD 07/29/2008   MILK PRODUCTS ALLERGY 07/29/2008   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/03/2007   Essential hypertension 06/27/2007   Backache 06/19/2007   Osteoporosis 06/19/2007   Obstructive sleep apnea 06/19/2007   TRIGGER FINGER 04/18/2007   DIVERTICULOSIS, COLON 11/13/2006    Immunization History  Administered Date(s) Administered   Fluad Quad(high Dose 65+) 01/01/2019, 12/11/2019, 11/10/2020   H1N1 01/09/2008   Influenza Split 12/12/2010, 11/16/2011   Influenza Whole 11/23/2006, 12/10/2007, 12/11/2008, 11/16/2009   Influenza,inj,Quad PF,6+ Mos 11/28/2012, 11/20/2013, 11/10/2014, 12/06/2015, 11/21/2016, 11/20/2017   Influenza,inj,quad, With Preservative 12/11/2016, 01/07/2018   Moderna Sars-Covid-2 Vaccination 04/27/2019, 05/25/2019, 02/16/2020   Pneumococcal Conjugate-13 09/16/2013   Pneumococcal Polysaccharide-23 08/03/2003, 12/11/2008, 08/22/2010, 08/31/2015   Td  09/23/2003   Tdap 12/29/2011   Zoster Recombinat (Shingrix) 02/12/2019, 07/03/2019    Conditions to be addressed/monitored: HTN, HLD, COPD, DMII, CKD Stage 3a, Anxiety, and Depression  Care Plan : Medication Management  Updates made by Beryle Lathe, [place] since 04/27/2021 12:00 AM     Problem: T2DM, HTN, HLD, COPD, CKD, Depression/Anxiety   Priority: High  Onset Date: 03/24/2021     Long-Range Goal: Disease Progression Prevention   Start Date: 03/24/2021  Expected End Date: 06/22/2021  Recent Progress: On track  Priority: High  Note:   Current Barriers:  Unable to independently monitor therapeutic efficacy Unable to achieve control of hypertension and hyperlipidemia  Pharmacist Clinical Goal(s):  Through collaboration with PharmD and provider, patient will  Achieve adherence to monitoring guidelines and medication adherence to achieve therapeutic efficacy Achieve control of hypertension and hyperlipidemia as evidenced by improved blood pressure control and improved triglycerides   Interventions: 1:1 collaboration with [name], MD regarding development and update of comprehensive plan of care as evidenced by provider attestation and co-signature Inter-disciplinary care team collaboration (see longitudinal plan of care) Comprehensive medication review performed; medication list updated in electronic medical record  Medication reconciliation: Difficult to obtain since patient report she does not handle her medications. She reports that she has a home health nurse come to her house every other Monday to fill her pill box.  She handles her refills alone and tries to set up everything on automatic refill but has a hard time managing all of her medications  Type 2 Diabetes -  Goal on Track (progressing): YES.: Follows with [name] Controlled; Most recent A1c at goal of <7% per ADA guidelines Current medications: insulin glargine (Toujeo) 32 units subcutaneously  at bedtime and insulin aspart (Novolog) 16-18 units subcutaneously 5-15 minutes before each meal Recent change: [name] had patient increase Toujeo to 32 units at bedtime due to elevated blood glucose Intolerances:  metformin Taking medications as directed: yes Side effects thought to be attributed to current medication regimen: no Denies hypoglycemic symptoms (sweaty and shaky). Emergency hypoglycemia treatment:  recommend that endocrinology consider prescribing glucagon due to tight blood glucose control and high risk for level 2-3 hypoglycemia Current meal patterns: not discussed today Current exercise: not discussed today On a statin: yes On aspirin 81 mg daily: yes Last microalbumin/creatinine ratio: 24 (08/21/19); on an ACEi/ARB: yes Last eye exam: completed within last year Last foot exam: completed within last year Current glucose readings:  patient reports checking finger stick blood glucose four times daily. Unsure of many readings lately but patient reports elevated blood glucose despite poor appetite. Per endocrinology note, may be discussing insulin pump and continuous glucose monitor Continue current medications as above per endocrinology. Need to make sure she understands how many units to inject. May not need such tight glycemic control based on other health factors Discussed management of hypoglycemia. If blood sugar <70 at any time, treat with simple sugar such as 1/2 cup juice or regular soda or 3-4 glucose tablets. Recheck blood sugar in 15 minutes and repeat if blood sugar remains <70.  Continue follow-up with endocrinology  Hypertension - Goal on Track (progressing): YES.: Follows with [name] Blood pressure under suboptimal control. Blood pressure is above goal of <130/80 mmHg per 2017 AHA/ACC guidelines. Current medications: losartan 100 mg by mouth once daily, amlodipine 10 mg by mouth once daily, spironolactone 25 mg by mouth once daily, and metoprolol tartrate  50 mg by mouth twice daily Intolerances: none Taking medications as directed:  questionable adherence to spironolactone Side effects thought to be attributed to current medication regimen: no Current home blood pressure: not discussed today Continue current medications as above Encourage dietary sodium restriction/DASH diet Recommend home blood pressure monitoring to discuss at next visit Discussed need for medication compliance  Hyperlipidemia - Goal on Track (progressing): YES.: Follows with [name] Uncontrolled. LDL at goal of <70 due to very high risk given diabetes + at least 1 additional major risk factor (advancing age, hypertension, chronic kidney disease (CKD), and cigarette smoking) per 2020 AACE/ACE guidelines. Triglycerides above goal of <150 per 2020 AACE/ACE guidelines. Current medications: rosuvastatin 5 mg by mouth once daily, ezetimibe 10 mg by mouth once daily, and over the counter fish oil Intolerances: none Taking medications as directed: yes Side effects thought to be attributed to current medication regimen: no Continue current medications as above Encourage dietary reduction of high fat containing foods such as butter, nuts, bacon, egg yolks, etc. Discussed need for medication compliance Patient counseled on smoking cessation Continue follow-up with cardiology Triglycerides likely elevated due to current smoking status  Chronic Obstructive Pulmonary Disease - Condition stable. Not addressed this visit.: Follows with [name] Controlled Current treatment: albuterol metered dose (ProAir, Ventolin, Proventil) 2 puffs by mouth every 6 hours as needed for shortness of breath or wheezing and tiotropium/olodaterol (Stiolto Respimat) 2 inhalations once daily and montelukast 10 mg by mouth at bedtime Received Trelegy samples but ran out GOLD Classification: unknown Most recent Pulmonary Function Testing: unknown 0 exacerbations requiring treatment in the  last 6 months   Current oxygen requirements: unknown Continue current medications as above Patient counseled on smoking cessation Continue follow-up with pulmonology  Chronic Kidney Disease Stage 3a - GFR 45-59 (Mild to Moderately Reduced Function) - Goal on Track (progressing): YES.: Patient recently had initial evaluation with Dr. Theador Hawthorne (nephrology). He has ordered several labs/testing. Appropriately managed Current medications: losartan 100 mg by mouth once daily, spironolactone 25 mg by mouth once daily, and rosuvastatin 5 mg by mouth once daily Intolerances: none Taking medications as directed:  questionable adherence to spironolactone Side effects thought to be attributed to current medication regimen: no Most recent GFR: 65 mL/min (04/20/21) Most recent microalbumin: 24 (08/21/19) Continue current medications as above Recommend adequate hydration  Patient counseled on smoking cessation Discussed need for medication compliance Discussed need for improved control of blood pressure Continue follow-up with nephrology   Depression and Anxiety - New goal.: Follows with Dr. Arvilla Meres Depression: sertraline (Zoloft)  100 mg by mouth once daily and bupropion (Wellbutrin) 150 mg by mouth once daily Anxiety: lorazepam (Ativan) 0.5 mg by mouth twice daily and hydroxyzine 10 mg by mouth every 8 hours as needed Mood stabilization: lamotrigine 100 mg by mouth twice daily, risperidone 0.5 mg by mouth at bedtime Sleep: trazodone 150 mg by mouth at bedtime  Continue current medications as above Continue follow-up with behavioral health  Patient Goals/Self-Care Activities Patient will:  Focus on medication adherence by keeping up with prescription refills and either using a pill box or reminders to take your medications at the prescribed times Check blood sugar four times a day at the following times: fasting (at least 8 hours since last food consumption), 1-2 hours after all meals, and whenever patient  experiences symptoms of hypo/hyperglycemia, document, and provide at future appointments Follow-up with all specialists as scheduled  Follow Up Plan: Telephone follow up appointment with care management team member scheduled for: 06/02/21      Medication Assistance: None required.  Patient affirms current coverage meets needs.  Patient's preferred pharmacy is:  [facility], [place] [address] Phone: [phone] Fax: [phone] - [place], [place] [address] Phone: [phone] Fax: [phone]  Follow Up:  Patient agrees to Care Plan and Follow-up.  Plan: Telephone follow up appointment with care management team member scheduled for:  06/02/21  Kennon Holter, PharmD, [place], CPP Clinical Pharmacist Practitioner [facility] [phone]

## 2021-04-27 NOTE — Progress Notes (Signed)
Patient aware.

## 2021-04-27 NOTE — Patient Instructions (Signed)
[name],  It was great to talk to you today!  Please call me with any questions or concerns.  Visit Information  Following are the goals we discussed today:   Goals Addressed             This Visit's Progress    Medication Management       Patient Goals/Self-Care Activities Patient will:  Focus on medication adherence by keeping up with prescription refills and either using a pill box or reminders to take your medications at the prescribed times Check blood sugar four times a day at the following times: fasting (at least 8 hours since last food consumption), 1-2 hours after all meals, and whenever patient experiences symptoms of hypo/hyperglycemia, document, and provide at future appointments Follow-up with all specialists as scheduled          Follow-up plan: Telephone follow up appointment with care management team member scheduled for:  06/02/21  Patient verbalizes understanding of instructions and care plan provided today and agrees to view in [place]. Active MyChart status confirmed with patient.    Please call the care guide team at [phone] if you need to cancel or reschedule your appointment.   Kennon Holter, PharmD, [place], CPP Clinical Pharmacist Practitioner [facility] [phone]

## 2021-04-28 ENCOUNTER — Telehealth: Payer: Self-pay | Admitting: *Deleted

## 2021-04-28 ENCOUNTER — Telehealth: Payer: Medicare HMO

## 2021-04-28 ENCOUNTER — Ambulatory Visit: Payer: Medicare HMO | Admitting: Physician Assistant

## 2021-04-28 NOTE — Telephone Encounter (Signed)
Attempted to reach patient to relay Dr Renda Rolls message explaining the necessity for using contrast.  Had to leave a message pending call back.  Needs MRI scheduled and MD visit to follow scheduled.

## 2021-04-28 NOTE — Telephone Encounter (Signed)
Patient was due for MRI Brain week of 04/18/2021 but did not follow through with MRI.  She called stating that she didn't schedule the MRI because it was planned with contrast and she has Stage 3 Chronic Kidney Disease and wants to have the MRI done without contrast if possible.  Routing to Dr Mickeal Skinner to advise if order can be changed.

## 2021-04-29 ENCOUNTER — Ambulatory Visit (HOSPITAL_COMMUNITY): Payer: Medicare HMO

## 2021-04-29 ENCOUNTER — Encounter (HOSPITAL_COMMUNITY): Payer: Self-pay

## 2021-04-29 ENCOUNTER — Telehealth: Payer: Self-pay

## 2021-04-29 NOTE — Telephone Encounter (Signed)
Patient called said the cough medicine is not working and now has gone into her chest.  Pharmacy: [name]

## 2021-04-30 ENCOUNTER — Encounter: Payer: Self-pay | Admitting: Emergency Medicine

## 2021-04-30 ENCOUNTER — Other Ambulatory Visit: Payer: Self-pay

## 2021-04-30 ENCOUNTER — Ambulatory Visit
Admission: EM | Admit: 2021-04-30 | Discharge: 2021-04-30 | Disposition: A | Discharge status: Home / Self Care | Payer: Medicare HMO | Attending: Urgent Care | Admitting: Urgent Care

## 2021-04-30 DIAGNOSIS — Z794 Long term (current) use of insulin: Secondary | ICD-10-CM

## 2021-04-30 DIAGNOSIS — R062 Wheezing: Secondary | ICD-10-CM | POA: Diagnosis not present

## 2021-04-30 DIAGNOSIS — U071 COVID-19: Secondary | ICD-10-CM

## 2021-04-30 DIAGNOSIS — J449 Chronic obstructive pulmonary disease, unspecified: Secondary | ICD-10-CM | POA: Diagnosis not present

## 2021-04-30 DIAGNOSIS — E119 Type 2 diabetes mellitus without complications: Secondary | ICD-10-CM

## 2021-04-30 DIAGNOSIS — R051 Acute cough: Secondary | ICD-10-CM | POA: Diagnosis not present

## 2021-04-30 MED ORDER — METHYLPREDNISOLONE SODIUM SUCC 125 MG IJ SOLR
125.0000 mg | Freq: Once | INTRAMUSCULAR | Status: AC
  Administered 2021-04-30: 125 mg via INTRAMUSCULAR

## 2021-04-30 NOTE — ED Provider Notes (Signed)
[name]   MRN: [id number] DOB: August 29, 1950  Subjective:   [name] is a 71 y.o. female presenting for 5 day history of persistent wheezing, shortness of breath, coughing.  Has started to feel weaker as well.  Patient was diagnosed with COVID-19 04/27/2021.  She was started on Paxlovid and is still taking this.  She has a history of COPD, still smokes.  She is using her albuterol inhaler as needed but has not scheduled it.  She does take Stiolto daily.  No chest pain, body aches.  Patient has been using supportive care with benzonatate.  She does have multiple risk factors including type 2 diabetes treated with insulin, COPD, anxiety, fibromyalgia, narcolepsy, history of seizures.  Takes Lamictal for the latter.  No current facility-administered medications for this encounter.  Current Outpatient Medications:    ACCU-CHEK GUIDE test strip, USE TO CHECK BLOOD SUGAR FOUR TIMES A DAY AND AS NEEDED (NEED MD APPOINTMENT FOR ANY FURTHER REFILLS), Disp: 400 strip, Rfl: 2   Accu-Chek Softclix Lancets lancets, TEST BLOOD SUGAR THREE TIMES DAILY AS DIRECTED, Disp: 300 each, Rfl: 2   albuterol (VENTOLIN HFA) 108 (90 Base) MCG/ACT inhaler, INHALE 1 TO 2 PUFFS EVERY 6 HOURS AS NEEDED FOR WHEEZING, SHORTNESS OF BREATH, Disp: 3 each, Rfl: 1   Alcohol Swabs (DROPSAFE ALCOHOL PREP) 70 % PADS, USE TO CLEAN FINGER PRIOR TO STICKING FOR BLOOD SUGAR., Disp: 200 each, Rfl: 0   alendronate (FOSAMAX) 70 MG tablet, TAKE 1 TABLET (70 MG TOTAL) BY MOUTH EVERY 7 (SEVEN) DAYS. TAKE WITH A FULL GLASS OF WATER ON AN EMPTY STOMACH., Disp: 12 tablet, Rfl: 3   amLODipine (NORVASC) 10 MG tablet, TAKE 1 TABLET EVERY DAY (Patient taking differently: Take 10 mg by mouth at bedtime.), Disp: 90 tablet, Rfl: 1   ascorbic acid (VITAMIN C) 500 MG tablet, Take 500 mg by mouth daily., Disp: , Rfl:    aspirin EC 81 MG tablet, Take 1 tablet (81 mg total) by mouth daily with breakfast., Disp: 120 tablet, Rfl: 2    benzonatate (TESSALON) 100 MG capsule, Take 1 capsule (100 mg total) by mouth 2 (two) times daily as needed for cough., Disp: 20 capsule, Rfl: 0   betamethasone dipropionate 0.05 % cream, betamethasone dipropionate 0.05 % topical cream, Disp: , Rfl:    Blood Glucose Calibration (ACCU-CHEK GUIDE CONTROL) LIQD, USE AS DIRECTED, Disp: 1 each, Rfl: 0   blood glucose meter kit and supplies, Dispense based on patient and insurance preference. Use up to four times daily as directed. (FOR ICD-10 E10.9, E11.9)., Disp: 1 each, Rfl: 0   buPROPion (WELLBUTRIN XL) 150 MG 24 hr tablet, Take 1 tablet (150 mg total) by mouth every morning., Disp: 90 tablet, Rfl: 2   cetirizine (ZYRTEC) 10 MG tablet, Take 1 tablet (10 mg total) by mouth daily., Disp: 30 tablet, Rfl: 1   Clobetasol Prop Emollient Base (CLOBETASOL PROPIONATE E) 0.05 % emollient cream, Apply to affected area qd, Disp: 60 g, Rfl: 6   Continuous Blood Gluc Sensor (FREESTYLE LIBRE 14 DAY SENSOR) MISC, 1 each by Does not apply route every 14 (fourteen) days. Change every 2 weeks, Disp: 2 each, Rfl: 11   diclofenac Sodium (VOLTAREN) 1 % GEL, APPLY 2 GRAMS  TOPICALLY FOUR TIMES DAILY. (Patient taking differently: Apply 2 g topically 4 (four) times daily.), Disp: 700 g, Rfl: 1   dicyclomine (BENTYL) 10 MG capsule, Take by mouth., Disp: , Rfl:    Docusate Sodium (DSS) 100 MG  CAPS, Take by mouth., Disp: , Rfl:    DROPLET PEN NEEDLES 31G X 8 MM MISC, USE FOR INJECTING INSULIN 4 TIMES DAILY., Disp: 400 each, Rfl: 0   ezetimibe (ZETIA) 10 MG tablet, TAKE 1 TABLET EVERY DAY (Patient taking differently: Take 10 mg by mouth daily.), Disp: 90 tablet, Rfl: 1   famotidine (PEPCID) 40 MG tablet, Take 40 mg by mouth 2 (two) times daily., Disp: , Rfl:    hydroxychloroquine (PLAQUENIL) 200 MG tablet, Take by mouth., Disp: , Rfl:    hydrOXYzine (ATARAX/VISTARIL) 10 MG tablet, Take 1 tablet (10 mg total) by mouth 3 (three) times daily as needed. (Patient taking differently:  Take 10 mg by mouth 3 (three) times daily as needed for itching or anxiety.), Disp: 30 tablet, Rfl: 0   insulin glargine, 2 Unit Dial, (TOUJEO MAX SOLOSTAR) 300 UNIT/ML Solostar Pen, Inject 24 Units into the skin at bedtime. (Patient taking differently: Inject 32 Units into the skin at bedtime.), Disp: 9 mL, Rfl: 1   lamoTRIgine (LAMICTAL) 100 MG tablet, Take 1 tablet (100 mg total) by mouth 2 (two) times daily., Disp: 180 tablet, Rfl: 2   levothyroxine (SYNTHROID) 50 MCG tablet, Take 1 tablet (50 mcg) 6 days 1/2 tablet (25 mcg) 1 day weekly., Disp: 80 tablet, Rfl: 3   LORazepam (ATIVAN) 0.5 MG tablet, Take 1 tablet (0.5 mg total) by mouth 2 (two) times daily., Disp: 60 tablet, Rfl: 2   losartan (COZAAR) 100 MG tablet, Take 1 tablet (100 mg total) by mouth daily. (Patient taking differently: Take 100 mg by mouth at bedtime.), Disp: 30 tablet, Rfl: 2   losartan (COZAAR) 50 MG tablet, Take 50 mg by mouth daily., Disp: , Rfl:    meclizine (ANTIVERT) 25 MG tablet, Take 1 tablet (25 mg total) by mouth 3 (three) times daily as needed for dizziness., Disp: 30 tablet, Rfl: 0   methocarbamol (ROBAXIN) 500 MG tablet, Take one tablet by mouth two times daily , as needed, for neck and back pain and spasm (Patient taking differently: Take 500 mg by mouth 2 (two) times daily as needed for muscle spasms (for neck and back pain and spasm).), Disp: 20 tablet, Rfl: 0   metoprolol tartrate (LOPRESSOR) 50 MG tablet, TAKE 1 TABLET TWICE DAILY (NEED MD APPOINTMENT) (Patient taking differently: Take 50 mg by mouth 2 (two) times daily.), Disp: 180 tablet, Rfl: 3   mirabegron ER (MYRBETRIQ) 25 MG TB24 tablet, Take 1 tablet (25 mg total) by mouth daily., Disp: 90 tablet, Rfl: 1   Misc. Devices (MATTRESS PAD) MISC, FIRM MATTRESS PAD for hospital bed x 1, Disp: 1 each, Rfl: 0   montelukast (SINGULAIR) 10 MG tablet, TAKE 1 TABLET EVERY DAY (Patient taking differently: Take 10 mg by mouth daily.), Disp: 90 tablet, Rfl: 1    nirmatrelvir/ritonavir EUA, renal dosing, (PAXLOVID) 10 x 150 MG & 10 x 100MG TABS, Take 2 tablets by mouth 2 (two) times daily for 5 days. (Take nirmatrelvir 150 mg one tablet twice daily for 5 days and ritonavir 100 mg one tablet twice daily for 5 days) Patient GFR is 62, Disp: 20 tablet, Rfl: 0   NOVOLOG FLEXPEN 100 UNIT/ML FlexPen, INJECT 16 TO 18 UNITS UNDER SKIN UP TO 3 TIMES A DAY BEFORE MEALS  (DISCARD PEN 28 DAYS AFTER OPENING), Disp: 60 mL, Rfl: 0   nystatin (MYCOSTATIN/NYSTOP) powder, APPLY TO AFFECTED AREA 4 TIMES DAILY., Disp: 90 g, Rfl: 1   Omega-3 Fatty Acids (FISH OIL PO), Take 1  capsule by mouth daily., Disp: , Rfl:    oxyCODONE-acetaminophen (PERCOCET) 10-325 MG tablet, Take 1 tablet by mouth every 8 (eight) hours as needed for pain., Disp: , Rfl:    Plecanatide (TRULANCE) 3 MG TABS, Take 3 mg by mouth daily as needed., Disp: 30 tablet, Rfl: 0   pregabalin (LYRICA) 75 MG capsule, Take 1 capsule (75 mg total) by mouth daily., Disp: , Rfl:    RABEprazole (ACIPHEX) 20 MG tablet, TAKE 1 TABLET TWICE DAILY, Disp: 180 tablet, Rfl: 0   RESTASIS 0.05 % ophthalmic emulsion, Place 2 drops into both eyes daily., Disp: , Rfl:    risperiDONE (RISPERDAL) 0.5 MG tablet, Take 1 tablet (0.5 mg total) by mouth at bedtime., Disp: 90 tablet, Rfl: 2   rosuvastatin (CRESTOR) 5 MG tablet, TAKE 1 TABLET AT BEDTIME, Disp: 90 tablet, Rfl: 2   sertraline (ZOLOFT) 100 MG tablet, Take 1 tablet (100 mg total) by mouth daily., Disp: 90 tablet, Rfl: 2   spironolactone (ALDACTONE) 25 MG tablet, Take 1 tablet (25 mg total) by mouth daily., Disp: 30 tablet, Rfl: 2   STIOLTO RESPIMAT 2.5-2.5 MCG/ACT AERS, INHALE 2 PUFFS INTO THE LUNGS DAILY. (Patient taking differently: Inhale 2 puffs into the lungs daily.), Disp: 12 g, Rfl: 1   traZODone (DESYREL) 150 MG tablet, Take 1 tablet (150 mg total) by mouth at bedtime., Disp: 90 tablet, Rfl: 2   UNABLE TO FIND, Hospital bed mattress x 1  DX: G47.33, J44.9, Disp: 1 each, Rfl:  0   UNABLE TO FIND, Standard wheelchair Dx M47.16, R29.898, Disp: 1 each, Rfl: 0   UNABLE TO FIND, Med Name: Activice cooling gel spray on arms, legs, and back, Disp: , Rfl:    UNABLE TO FIND, Hospital bed with orthopedic mattress x 1 DX G47.33, G89.4, Disp: 1 each, Rfl: 0   Allergies  Allergen Reactions   Lac Bovis Rash    Doesn't agree with stomach.    Phenazopyridine Hcl Hives   Cephalexin Hives   Flonase [Fluticasone]     "It gave me ulcers in my nose"   Iron Nausea And Vomiting    And itching   Milk-Related Compounds Other (See Comments)    Doesn't agree with stomach.    Penicillins Hives    Has patient had a PCN reaction causing immediate rash, facial/tongue/throat swelling, SOB or lightheadedness with hypotension: Yes Has patient had a PCN reaction causing severe rash involving mucus membranes or skin necrosis: No Has patient had a PCN reaction that required hospitalization No Has patient had a PCN reaction occurring within the last 10 years: No If all of the above answers are "NO", then may proceed with Cephalosporin use.    Phenazopyridine Hives   Phenazopyridine Hcl Hives          Past Medical History:  Diagnosis Date   Allergy    Anemia    Anxiety    takes Ativan daily   Arthritis    Assistance needed for mobility    Bipolar disorder ([place])    takes Risperdal nightly   Blood transfusion    Brain tumor ([place])    Cancer ([place])    In her gum   Carpal tunnel syndrome of right wrist 05/23/2011   Cervical disc disorder with radiculopathy of cervical region 10/31/2012   Chronic back pain    Chronic idiopathic constipation    Chronic neck and back pain    Colon polyps    COPD (chronic obstructive pulmonary disease) with chronic bronchitis (  [place]) 09/16/2013   Office Spirometry 10/30/2013-submaximal effort based on appearance of loop and curve. Numbers would fit with severe restriction but her physiologic capability may be better than this. FVC 0.91/44%, and 10.74/45%,  FEV1/FVC 0.81, FEF 25-75% 1.43/69%     Diabetes mellitus    Type II   Diverticulosis    TCS 9/08 by Dr. Delfin Edis for diarrhea . Bx for micro scopic colitis negative.    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   Metabolic encephalopathy 7/51/0258   Migraines    chronic headaches   Mononeuritis lower limb    Narcolepsy    Osteoporosis    Pancreatitis 2006   due to Depakote with normal EUS    Schatzki's ring    non critical / EGD with ED 8/2011with RMR   Seizures ([place])    takes Lamictal daily.Last seizure 3 yrs ago   Sleep apnea    on CPAP   Small bowel obstruction ([facility])    Stroke ([place])    left sided weakness, speech changes   Tubular adenoma of colon      Past Surgical History:  Procedure Laterality Date   ABDOMINAL HYSTERECTOMY  1978   BACK SURGERY  July 2012   BACTERIAL OVERGROWTH TEST N/A 05/05/2013   Procedure: BACTERIAL OVERGROWTH TEST;  Surgeon: Daneil Dolin, MD;  [facility];  Service: Endoscopy;  Laterality: N/A;  7:30   BIOPSY THYROID  2009   BRAIN SURGERY  11/2011   resection of meningioma   BREAST REDUCTION SURGERY  1994   CARDIAC CATHETERIZATION  05/10/2005   normal coronaries, normal LV systolic function and EF (Dr. Jackie Plum)   [place] Left 07/22/04   Dr. Aline Brochure   CATARACT EXTRACTION Bilateral    CHOLECYSTECTOMY  1984   COLONOSCOPY N/A 09/25/2012   NID:[id number] diverticulosis.  colonic polyp-removed :  tubular adenoma   CRANIOTOMY  11/23/2011   Procedure: CRANIOTOMY TUMOR EXCISION;  Surgeon: Hosie Spangle, MD;  Location: [facility]URO ORS;  Service: Neurosurgery;  Laterality: N/A;  Craniotomy for tumor resection   ESOPHAGOGASTRODUODENOSCOPY  12/29/2010   [place]-Retained food in the esophagus and stomach, small hiatal hernia, status post Maloney dilation of the esophagus   ESOPHAGOGASTRODUODENOSCOPY N/A 09/25/2012   TIR:[id number] atonic baggy esophagus status post Maloney dilation 4 F. Hiatal hernia   GIVENS CAPSULE STUDY N/A 01/15/2013   NORMAL.    IR GENERIC HISTORICAL  03/17/2016   IR RADIOLOGIST EVAL & MGMT 03/17/2016 [facility]   LESION REMOVAL N/A 05/31/2015   Procedure: REMOVAL RIGHT AND LEFT LESIONS OF MANDIBLE;  Surgeon: Diona Browner, DDS;  Location: [facility];  Service: Oral Surgery;  Laterality: N/A;   MALONEY DILATION  12/29/2010   RMR;   NM MYOCAR PERF WALL MOTION  2006   "relavtiely normal" persantine, mild anterior thinning (breast attenuation artifact), no region of scar/ischemia   OVARIAN  CYST REMOVAL     RECTOCELE REPAIR N/A 06/29/2015   Procedure: POSTERIOR REPAIR (RECTOCELE);  Surgeon: Jonnie Kind, MD;  Location: [facility];  Service: Gynecology;  Laterality: N/A;   REDUCTION MAMMAPLASTY Bilateral    SPINE SURGERY  09/29/2010   Dr. Rolena Infante   surgical excision of 3 tumors from right thigh and right buttock  and left upper thigh  2010   TOOTH EXTRACTION Bilateral 12/14/2014   Procedure: REMOVAL OF BILATERAL MANDIBULAR EXOSTOSES;  Surgeon: Diona Browner, DDS;  Location: [facility];  Service: Oral Surgery;  Laterality: Bilateral;   TRANSTHORACIC ECHOCARDIOGRAM  2010   EF 60-65%, mild conc LVH, grade 1 diastolic dysfunction; mildly calcified MV annulus with mildly thickened leaflets, mildly calcified MR annulus    Family History  Problem Relation Age of Onset   Heart attack Mother        HTN   Pneumonia Father    Kidney failure Father    Diabetes Father    Pancreatic cancer Sister     Cancer Sister        breast    Cancer Sister        pancreatic   Diabetes Brother    Hypertension Brother    Diabetes Brother    Alcohol abuse Maternal Uncle    Stroke Maternal Grandmother    Heart attack Maternal Grandfather    Hypertension Son    Sleep apnea Son    Colon cancer Neg Hx    Anesthesia problems Neg Hx    Hypotension Neg Hx    Malignant hyperthermia Neg Hx    Pseudochol deficiency Neg Hx    Breast cancer Neg Hx    Stomach cancer Neg Hx     Social History   Tobacco Use   Smoking status: Some Days    Years: 30.00    Types: Cigarettes   Smokeless tobacco: Never   Tobacco comments:    quit 7 weeks ago-07/14/2020-AH   Vaping Use   Vaping Use: Never used  Substance Use Topics   Alcohol use: No    Alcohol/week: 0.0 standard drinks    Comment:     Drug use: No    ROS   Objective:   Vitals: BP 138/64 (BP Location: Right Arm)    Pulse 66    Temp 98.2 F (36.8 C) (Oral)    Resp 20    SpO2 94%   Physical Exam Constitutional:      General: She is not in acute distress.    Appearance: Normal appearance. She is well-developed. She is not ill-appearing, toxic-appearing or diaphoretic.  HENT:     Head: Normocephalic and atraumatic.     Nose: Nose normal.     Mouth/Throat:     Mouth: Mucous membranes are moist.  Eyes:     General: No scleral icterus.       Right eye: No discharge.        Left eye: No discharge.     Extraocular Movements: Extraocular movements intact.  Cardiovascular:     Rate and Rhythm: Normal rate.     Heart sounds: No murmur heard.   No friction rub. No gallop.  Pulmonary:     Effort: No respiratory distress.     Breath sounds: No stridor. No wheezing, rhonchi or rales.     Comments: Slight decrease in lung sounds bibasilar fields. Chest:     Chest wall: No tenderness.  Skin:    General: Skin is warm and dry.  Neurological:  General: No focal deficit present.     Mental Status: She is alert and oriented to person,  place, and time.  Psychiatric:        Mood and Affect: Mood normal.        Behavior: Behavior normal.    Assessment and Plan :   PDMP not reviewed this encounter.  1. COVID-19   2. Chronic obstructive pulmonary disease, unspecified COPD type ([place])   3. Type 2 diabetes mellitus treated with insulin ([place])   4. Wheezing   5. Acute cough    Patient does not want to go to the hospital. Deferred imaging given clear cardiopulmonary exam, hemodynamically stable vital signs.  In the context of her COPD and active smoking, recommended IM Solu-Medrol 125 mg.  Also advised that she stop smoking at least for the duration of her illness, schedule her albuterol inhaler.  Finish the [employer].  Follow-up with PCP as soon as possible.  Counseled patient on potential for adverse effects with medications prescribed/recommended today, maintain strict ER and return-to-clinic precautions discussed, patient verbalized understanding.    Jaynee Eagles, PA-C 04/30/21 1352

## 2021-04-30 NOTE — ED Triage Notes (Signed)
Wheezing and coughing, states the grip in her right hand feels weak.   Diagnosed with covid this week.

## 2021-05-02 ENCOUNTER — Ambulatory Visit (INDEPENDENT_AMBULATORY_CARE_PROVIDER_SITE_OTHER): Payer: Medicare HMO | Admitting: Psychiatry

## 2021-05-02 ENCOUNTER — Other Ambulatory Visit: Payer: Self-pay

## 2021-05-02 ENCOUNTER — Telehealth: Payer: Self-pay

## 2021-05-02 ENCOUNTER — Inpatient Hospital Stay (HOSPITAL_COMMUNITY): Payer: Medicare HMO

## 2021-05-02 DIAGNOSIS — F331 Major depressive disorder, recurrent, moderate: Secondary | ICD-10-CM

## 2021-05-02 NOTE — Telephone Encounter (Signed)
Patient suppose to go today to do bloodwork. Please contact patient.

## 2021-05-02 NOTE — Telephone Encounter (Signed)
Patient called asked does she need to go do her bloodwork for cancer doctor?  She is still coughing.  Please contact patient back at [phone].

## 2021-05-02 NOTE — Telephone Encounter (Signed)
Virtual appt scheduled for 2/21 @ 9:20 am

## 2021-05-02 NOTE — Telephone Encounter (Signed)
Spoke with patient.

## 2021-05-02 NOTE — Telephone Encounter (Signed)
Will need another tele visit with provider if she feels her condition is worsening

## 2021-05-02 NOTE — Progress Notes (Signed)
Virtual Visit via Telephone Note  I connected with [name] on 05/02/21 at 4:10 PM EST by telephone and verified that I am speaking with the correct person using two identifiers.  Location: Patient: Home  Provider: [place] [facility]   I discussed the limitations, risks, security and privacy concerns of performing an evaluation and management service by telephone and the availability of in person appointments. I also discussed with the patient that there may be a patient responsible charge related to this service. The patient expressed understanding and agreed to proceed.    I provided 45 minutes of non-face-to-face time during this encounter.   [name] Smoker, LCSW               THERAPIST PROGRESS NOTE    Session Time: Monday 05/02/2021 4:10 PM - 4:55 PM   Participation Level: Active  Behavioral Response:  Less depressed, talkative, alert   Type of Therapy: Individual Therapy  Treatment Goals addressed: Patient wants to learn how to improve coping skills to manage chronic pain and health issues/ improve mood    Interventions: CBT and Supportive             Summary: [name] is a 71 y.o. female who presents with  long standing history of recurrent periods  of depression beginning when she was [age] and her favorite uncle died. Patient reports multiple psychiatric hospitlaizations due to depression and suicidal ideations with the last one occuring in 1997. Patient has participated in outpatient psychotherapy and medication management intermittently since age 6.  She currently is seeing psychiatrist Dr. Harrington Challenger . Prior to this, she was seen at [facility]. Patient also has had ECT at [facility]. Symptoms have worsened in recent months due to family stress and have  included depressed mood, anxiety, excessive worry, and tearfulness.         Patient's last contact was by virtual visit via telephone about 7-8 weeks ago.   She reports continuing having good days and  bad days.  She is not feeling well today as she has COVID.  Prior to getting COVID, she reports maintaining involvement in activities including attending church and trying to read more.  However, she expresses frustration as she has difficulty concentrating when trying to read.  She remains concerned about her health and reports multiple medical appointments.  She reports being most concerned now about her kidneys.  She is working with her doctor regarding this.  She continues to express frustration son and grandson do not contact her as often as she thinks they should.  She reports using assertiveness skills rather than being aggressive to express her concerns to her grandson.  She has not contacted [employer] or talk with a dietitian yet but still plans to do this.    Suicidal/Homicidal: Nowithout intent/plan      Therapist Response: Reviewed symptoms, praised and reinforced patient's efforts to maintain involvement in activities and be proactive, discussed effects on her mood/thoughts/behaviors, discussed stressors, facilitated expression of thoughts and feelings, validated feelings, encouraged patient to express concerns to her daughter regarding her kidneys, praised and reinforced patient's efforts to be assertive rather than aggressive, assisted patient identify realistic expectations regarding contact with grandson, encourage patient to follow through with her plans regarding contacting [employer]/talk with dietitian Diagnosis: Axis I: MDD, Recurrent, moderate    Axis II: Deferred    [name] [name] [name], LCSW

## 2021-05-03 ENCOUNTER — Telehealth: Payer: Self-pay

## 2021-05-03 ENCOUNTER — Other Ambulatory Visit: Payer: Self-pay

## 2021-05-03 ENCOUNTER — Ambulatory Visit (INDEPENDENT_AMBULATORY_CARE_PROVIDER_SITE_OTHER): Payer: Medicare HMO | Admitting: Nurse Practitioner

## 2021-05-03 ENCOUNTER — Other Ambulatory Visit: Payer: Self-pay | Admitting: *Deleted

## 2021-05-03 ENCOUNTER — Encounter: Payer: Self-pay | Admitting: Nurse Practitioner

## 2021-05-03 DIAGNOSIS — R051 Acute cough: Secondary | ICD-10-CM | POA: Diagnosis not present

## 2021-05-03 DIAGNOSIS — I10 Essential (primary) hypertension: Secondary | ICD-10-CM

## 2021-05-03 DIAGNOSIS — D329 Benign neoplasm of meninges, unspecified: Secondary | ICD-10-CM

## 2021-05-03 DIAGNOSIS — E785 Hyperlipidemia, unspecified: Secondary | ICD-10-CM

## 2021-05-03 MED ORDER — GUAIFENESIN ER 600 MG PO TB12: 600.0000 mg | ORAL_TABLET | Freq: Two times a day (BID) | ORAL | 0 refills | Status: DC

## 2021-05-03 NOTE — Telephone Encounter (Signed)
Pt called and lvm advising she was experiencing some high b/s.  Called pt and from what she could explain this morning her blood sugar was 397. Pt advised she took 36 units of Toujeo last night. Pt took 17 units of Novolog and her b/s has since come down to 194.  This was not the only day of high blood sugars. Pt has recently had prednisone injection. Pt wants to know what she can do to regulate her blood sugars

## 2021-05-03 NOTE — Telephone Encounter (Signed)
Pt advised to increase her meal time insulin to 22-24 units before each meal. Pt advised she is having a difficult time understanding further instructions (correction doses) and does not have anyone else available to explain changes to. Advised pt we could try sending her a letter explaining correction doses and when it's appropriate to take.

## 2021-05-03 NOTE — Chronic Care Management (AMB) (Signed)
°  Care Management   Note  05/03/2021 Name: [name] MRN: [id number] DOB: 16-Jul-1950  [name] is a 71 y.o. year old female who is a primary care patient of [name], MD and is actively engaged with the care management team. I reached out to [name] by phone today to assist with re-scheduling an initial visit with the RN Case Manager  Follow up plan: Telephone appointment with care management team member scheduled for:05/12/21  [place] [facility]  Direct Dial: [phone]

## 2021-05-03 NOTE — Progress Notes (Signed)
Virtual Visit via Telephone Note  I connected with [name] on 05/03/21 at 1012by telephone and verified that I am speaking with the correct person using two identifiers.I spent 8 minutes on this telephone encounter  Location: Patient: home Provider: office   I discussed the limitations, risks, security and privacy concerns of performing an evaluation and management service by telephone and the availability of in person appointments. I also discussed with the patient that there may be a patient responsible charge related to this service. The patient expressed understanding and agreed to proceed.   History of Present Illness: Pt c/o coughing up a lot with grey colored sputum, tested positive for covid on 2/15, she was prescribed paxlovid which she has taken,she has been taking tessalon 100 mg bid fro cough but it s not helping. She is a current smoker, pt advised on the need to quit smoking she verbalized understanding. Pt denies bloody sputum, chest pain, wheezing, sob, fever , chills   Observations/Objective: Assessment and Plan:  Acute cough Due to recent COVID 19 infection Pt told to take mucinex 600mg  BID States that she took paxlovid as prescribed  Drink plenty of water to stay hydrated.  I told pt that her cough might linger for a while due to her recent covid but that she get better with time. Pt educated on the need to quits smoking she verbalized understanding.   Follow Up Instructions:    I discussed the assessment and treatment plan with the patient. The patient was provided an opportunity to ask questions and all were answered. The patient agreed with the plan and demonstrated an understanding of the instructions.   The patient was advised to call back or seek an in-person evaluation if the symptoms worsen or if the condition fails to improve as anticipated.

## 2021-05-03 NOTE — Telephone Encounter (Signed)
Labs ordered.

## 2021-05-03 NOTE — Progress Notes (Signed)
Spoke with patient explained necessity for contrast for Brain MRI.  Order had expired. Reentered order.  Patient recent COVID positive.  Scan can be completed after 05/19/2021.  Patient is aware that [facility] will call her.

## 2021-05-03 NOTE — Telephone Encounter (Signed)
Patient called and waiting on lab work to be ordered.

## 2021-05-03 NOTE — Telephone Encounter (Signed)
Patient called asking needs something stronger with codeine in it.    Pharmacy:  [name]

## 2021-05-03 NOTE — Assessment & Plan Note (Addendum)
Acute cough Due to recent COVID 19 infection on 2/15 Pt told to take mucinex 600mg  BID She has taken paxlovid tablets   Drink plenty of water to stay hydrated.  I told pt that her cough might linger for a while due to her recent covid but that she get better with time. Pt educated on the need to quits smoking she verbalized understanding.

## 2021-05-04 NOTE — Telephone Encounter (Signed)
Normally, I would recommend that, but I do not want to confuse her further.  Lets just stick with the mealtime insulin doses.

## 2021-05-04 NOTE — Telephone Encounter (Signed)
Patient calling back needs some medicine to help with her cough. Please call patient back [phone]

## 2021-05-04 NOTE — Telephone Encounter (Signed)
Ok, let's do that in that case and please advise her to back off the insulin doses again after the effect of the steroids on the blood sugars has resolved.

## 2021-05-04 NOTE — Telephone Encounter (Signed)
Patient states she has been taking the mucinex and tessalon pearls but the mucus is so thick she's getting choked trying to cough it up any other suggestions? Patient is afraid she will end up in the hospital

## 2021-05-04 NOTE — Telephone Encounter (Signed)
Message sent to [facility] to advise

## 2021-05-04 NOTE — Telephone Encounter (Signed)
Letter sent with instructions.

## 2021-05-04 NOTE — Telephone Encounter (Signed)
T, If she can see the messages in my chart, we can send them through there. C

## 2021-05-05 ENCOUNTER — Telehealth: Payer: Self-pay

## 2021-05-05 NOTE — Telephone Encounter (Signed)
Pt had a telephone visit with [facility] 2/13. Please advise

## 2021-05-05 NOTE — Telephone Encounter (Signed)
Patient called to let Dr Moshe Cipro know she started having chills yesterday and now has fever 100 and pain in back of head.  Should she take any other medication?

## 2021-05-06 ENCOUNTER — Ambulatory Visit (HOSPITAL_COMMUNITY)
Admission: RE | Admit: 2021-05-06 | Discharge: 2021-05-06 | Disposition: A | Discharge status: Home / Self Care | Payer: Medicare HMO | Source: Ambulatory Visit | Attending: Nephrology | Admitting: Nephrology

## 2021-05-06 ENCOUNTER — Other Ambulatory Visit: Payer: Self-pay

## 2021-05-06 ENCOUNTER — Inpatient Hospital Stay (HOSPITAL_COMMUNITY): Payer: Medicare HMO | Attending: Hematology

## 2021-05-06 DIAGNOSIS — D509 Iron deficiency anemia, unspecified: Secondary | ICD-10-CM | POA: Diagnosis not present

## 2021-05-06 DIAGNOSIS — I129 Hypertensive chronic kidney disease with stage 1 through stage 4 chronic kidney disease, or unspecified chronic kidney disease: Secondary | ICD-10-CM | POA: Insufficient documentation

## 2021-05-06 DIAGNOSIS — D631 Anemia in chronic kidney disease: Secondary | ICD-10-CM

## 2021-05-06 DIAGNOSIS — N189 Chronic kidney disease, unspecified: Secondary | ICD-10-CM | POA: Diagnosis not present

## 2021-05-06 DIAGNOSIS — N183 Chronic kidney disease, stage 3 unspecified: Secondary | ICD-10-CM | POA: Insufficient documentation

## 2021-05-06 DIAGNOSIS — D638 Anemia in other chronic diseases classified elsewhere: Secondary | ICD-10-CM

## 2021-05-06 DIAGNOSIS — E1122 Type 2 diabetes mellitus with diabetic chronic kidney disease: Secondary | ICD-10-CM | POA: Insufficient documentation

## 2021-05-06 LAB — CBC WITH DIFFERENTIAL/PLATELET
Abs Immature Granulocytes: 0.18 10*3/uL — ABNORMAL HIGH (ref 0.00–0.07)
Basophils Absolute: 0 10*3/uL (ref 0.0–0.1)
Basophils Relative: 0 %
Eosinophils Absolute: 0.3 10*3/uL (ref 0.0–0.5)
Eosinophils Relative: 2 %
HCT: 33.3 % — ABNORMAL LOW (ref 36.0–46.0)
Hemoglobin: 9.6 g/dL — ABNORMAL LOW (ref 12.0–15.0)
Immature Granulocytes: 2 %
Lymphocytes Relative: 20 %
Lymphs Abs: 2.3 10*3/uL (ref 0.7–4.0)
MCH: 23 pg — ABNORMAL LOW (ref 26.0–34.0)
MCHC: 28.8 g/dL — ABNORMAL LOW (ref 30.0–36.0)
MCV: 79.9 fL — ABNORMAL LOW (ref 80.0–100.0)
Monocytes Absolute: 0.9 10*3/uL (ref 0.1–1.0)
Monocytes Relative: 8 %
Neutro Abs: 8 10*3/uL — ABNORMAL HIGH (ref 1.7–7.7)
Neutrophils Relative %: 68 %
Platelets: 234 10*3/uL (ref 150–400)
RBC: 4.17 MIL/uL (ref 3.87–5.11)
RDW: 14.9 % (ref 11.5–15.5)
WBC: 11.7 10*3/uL — ABNORMAL HIGH (ref 4.0–10.5)
nRBC: 0 % (ref 0.0–0.2)

## 2021-05-06 LAB — FERRITIN: Ferritin: 746 ng/mL — ABNORMAL HIGH (ref 11–307)

## 2021-05-06 LAB — IRON AND TIBC
Iron: 37 ug/dL (ref 28–170)
Saturation Ratios: 12 % (ref 10.4–31.8)
TIBC: 304 ug/dL (ref 250–450)
UIBC: 267 ug/dL

## 2021-05-06 NOTE — Telephone Encounter (Signed)
Pls advise  her to go to the ED  with new headache and chills and I will see her in the office in follow up next week, pls schedul an appt for that

## 2021-05-06 NOTE — Telephone Encounter (Signed)
Spoke with pt advised of Dr Griffin Dakin message she verbalized understanding scheduled for 4/28

## 2021-05-06 NOTE — Progress Notes (Signed)
Virtual Visit via Telephone Note [facility]  I connected with [name]  on 05/09/21 at  2:19 PM by telephone and verified that I am speaking with the correct person using two identifiers.  Location: Patient: Home Provider: [facility]   I discussed the limitations, risks, security and privacy concerns of performing an evaluation and management service by telephone and the availability of in person appointments. I also discussed with the patient that there may be a patient responsible charge related to this service. The patient expressed understanding and agreed to proceed.   REASON FOR VISIT:  Follow-up for microcytic anemia/iron deficiency anemia   PRIOR THERAPY: Iron tablets   CURRENT THERAPY: Intermittent Feraheme (last on 02/19/2020)   INTERVAL HISTORY:  [name] 71 y.o. female returns for routine follow-up of her microcytic iron deficiency anemia.  She was last seen by Tarri Abernethy PA-C on 12/30/2020.  At today's visit, she reports feeling somewhat poorly in the setting of recent COVID-19. She had an ED visit on 04/30/2021 and was diagnosed with COVID-19.  Since that time, she has continued to feel fatigued and "rundown".  She has an appointment with her PCP tomorrow.  She reports occasional dark stool, last "black stool" over a month ago, reportedly happens every 2 to 4 months.  She denies any bright red blood per rectum or hematochezia.  She has pica cravings for ice chips as well as headaches.  She denies any restless legs, chest pain, or syncope.  She has dyspnea on exertion and lightheadedness.  She denies any fever, chills, or unintentional weight loss.  She has 40% energy and 60% appetite. She endorses that she is maintaining a stable weight.   OBSERVATIONS/OBJECTIVE: Review of Systems  Constitutional:  Positive for malaise/fatigue. Negative for chills, diaphoresis, fever and weight loss.  HENT:         Occasional difficulty  swallowing  Respiratory:  Positive for cough (Recent COVID-19) and shortness of breath.   Cardiovascular:  Negative for chest pain and palpitations.  Gastrointestinal:  Positive for constipation, diarrhea and nausea. Negative for abdominal pain, blood in stool, melena and vomiting.  Neurological:  Positive for tingling and headaches. Negative for dizziness.  Psychiatric/Behavioral:  Positive for depression. The patient is nervous/anxious.     PHYSICAL EXAM (per limitations of virtual telephone visit): The patient is alert and oriented x 3, exhibiting adequate mentation, good mood, and ability to speak in full sentences and execute sound judgement.   ASSESSMENT & PLAN: 1.  Microcytic anemia - This is a combination anemia from functional iron deficiency (in the setting of chronic disease including COPD, type 2 diabetes, fibromyalgia, hypertension, and multiple other comorbidities listed in history) and chronic kidney disease stage III. - SPEP checked in 2019 was normal. - Colonoscopy (11/04/2020): Multiple diverticula, otherwise normal - EGD (11/04/2020): Normal esophagus, gastritis, gastric lipoma - She was previously placed on iron pills, but could not tolerate them due to constipation. - Last Feraheme on 02/12/2020 on 02/19/2020. - She denies any bleeding per rectum or melena, but does have occasional dark stools - Most recent labs (05/06/2021): Hgb 9.6/MCV 79.9, ferritin 746, iron saturation 12% - PLAN: No indication for parenteral iron therapy at this time.  RTC 2 months with repeat labs.  We will consider Retacrit in the future if Hgb drops to < 10.0 in the setting of CKD stage III. - She does not see a nephrologist for her CKD stage III - we we will  defer this decision to her PCP regarding ongoing management of her kidney disease   FOLLOW UP INSTRUCTIONS: Repeat labs and RTC in 2 months  I discussed the assessment and treatment plan with the patient. The patient was provided an opportunity  to ask questions and all were answered. The patient agreed with the plan and demonstrated an understanding of the instructions.   The patient was advised to call back or seek an in-person evaluation if the symptoms worsen or if the condition fails to improve as anticipated.  I provided 21 minutes of non-face-to-face time during this encounter.   Harriett Rush, PA-C 05/09/2021 2:37 PM

## 2021-05-09 ENCOUNTER — Inpatient Hospital Stay (HOSPITAL_BASED_OUTPATIENT_CLINIC_OR_DEPARTMENT_OTHER): Payer: Medicare HMO | Admitting: Physician Assistant

## 2021-05-09 ENCOUNTER — Other Ambulatory Visit: Payer: Self-pay

## 2021-05-09 DIAGNOSIS — M160 Bilateral primary osteoarthritis of hip: Secondary | ICD-10-CM | POA: Diagnosis not present

## 2021-05-09 DIAGNOSIS — D509 Iron deficiency anemia, unspecified: Secondary | ICD-10-CM | POA: Diagnosis not present

## 2021-05-09 DIAGNOSIS — N183 Chronic kidney disease, stage 3 unspecified: Secondary | ICD-10-CM | POA: Diagnosis not present

## 2021-05-09 DIAGNOSIS — R2681 Unsteadiness on feet: Secondary | ICD-10-CM | POA: Diagnosis not present

## 2021-05-09 DIAGNOSIS — E1142 Type 2 diabetes mellitus with diabetic polyneuropathy: Secondary | ICD-10-CM | POA: Diagnosis not present

## 2021-05-09 DIAGNOSIS — F250 Schizoaffective disorder, bipolar type: Secondary | ICD-10-CM | POA: Diagnosis not present

## 2021-05-09 DIAGNOSIS — F17210 Nicotine dependence, cigarettes, uncomplicated: Secondary | ICD-10-CM | POA: Diagnosis not present

## 2021-05-09 DIAGNOSIS — J449 Chronic obstructive pulmonary disease, unspecified: Secondary | ICD-10-CM | POA: Diagnosis not present

## 2021-05-09 DIAGNOSIS — D631 Anemia in chronic kidney disease: Secondary | ICD-10-CM

## 2021-05-09 DIAGNOSIS — D649 Anemia, unspecified: Secondary | ICD-10-CM | POA: Diagnosis not present

## 2021-05-09 DIAGNOSIS — J42 Unspecified chronic bronchitis: Secondary | ICD-10-CM | POA: Diagnosis not present

## 2021-05-09 DIAGNOSIS — E039 Hypothyroidism, unspecified: Secondary | ICD-10-CM | POA: Diagnosis not present

## 2021-05-10 ENCOUNTER — Encounter: Payer: Self-pay | Admitting: Family Medicine

## 2021-05-10 ENCOUNTER — Ambulatory Visit (INDEPENDENT_AMBULATORY_CARE_PROVIDER_SITE_OTHER): Payer: Medicare HMO | Admitting: Family Medicine

## 2021-05-10 VITALS — BP 119/68 | HR 66 | Resp 16 | Ht 59.0 in | Wt 170.0 lb

## 2021-05-10 DIAGNOSIS — R0989 Other specified symptoms and signs involving the circulatory and respiratory systems: Secondary | ICD-10-CM | POA: Diagnosis not present

## 2021-05-10 DIAGNOSIS — E1143 Type 2 diabetes mellitus with diabetic autonomic (poly)neuropathy: Secondary | ICD-10-CM

## 2021-05-10 DIAGNOSIS — N1831 Chronic kidney disease, stage 3a: Secondary | ICD-10-CM | POA: Diagnosis not present

## 2021-05-10 DIAGNOSIS — R5383 Other fatigue: Secondary | ICD-10-CM

## 2021-05-10 DIAGNOSIS — I10 Essential (primary) hypertension: Secondary | ICD-10-CM | POA: Diagnosis not present

## 2021-05-10 DIAGNOSIS — E785 Hyperlipidemia, unspecified: Secondary | ICD-10-CM | POA: Diagnosis not present

## 2021-05-10 DIAGNOSIS — Z79899 Other long term (current) drug therapy: Secondary | ICD-10-CM | POA: Diagnosis not present

## 2021-05-10 DIAGNOSIS — G4710 Hypersomnia, unspecified: Secondary | ICD-10-CM | POA: Diagnosis not present

## 2021-05-10 DIAGNOSIS — E782 Mixed hyperlipidemia: Secondary | ICD-10-CM | POA: Diagnosis not present

## 2021-05-10 DIAGNOSIS — F17210 Nicotine dependence, cigarettes, uncomplicated: Secondary | ICD-10-CM | POA: Diagnosis not present

## 2021-05-10 NOTE — Progress Notes (Signed)
[name]     MRN: [id number]      DOB: 14-Sep-1950   HPI [name] is here for follow up of  covid positive infection 02/13, was treated with paxlovid. C/o head feeling clogged and groggy, reports no sleeep. Feels mentally and physically drained No cough , fever, chills,  Wants to quit smoing but finding it increasingly impossible to do so, wants help  ROS Denies recent fever or chills.c/o fatigue Denies sinus pressure, nasal congestion, ear pain or sore throat. Denies chest congestion, productive cough or wheezing. Denies chest pains, palpitations and leg swelling Denies abdominal pain, nausea, vomiting,diarrhea or constipation.   Denies dysuria, frequency, hesitancy. Denies uncontrolled joint pain, swelling and limitation in mobility. Denies headaches, seizures, numbness, or tingling. Denies depression, anxiety or insomnia. Denies skin break down or rash.   PE  BP 119/68    Pulse 66    Resp 16    Ht 4\' 11"  (1.499 m)    Wt 170 lb (77.1 kg)    SpO2 90%    BMI 34.34 kg/m   Patient drowsy and oriented and in no cardiopulmonary distress.  HEENT: No facial asymmetry, EOMI,     Neck adequate ROM .  Chest: Clear to auscultation bilaterally.  CVS: S1, S2 no murmurs, no S3.Regular rate.  ABD: Soft non tender.   Ext: No edema  MS: decreased ROM spine, shoulders, hips and knees.  Skin: Intact, no ulcerations or rash noted.  Psych: Good eye contact, normal affect.  not anxious or depressed appearing.  CNS: CN 2-12 intact, power,  normal throughout.no focal deficits noted.   Assessment & Plan  Fatigue Multifactorial, recovering from covid, polypharmacy a lot of which is sedating, diabetes with hypoglycemia .  Encouraged adequate hydration and frequent regular meals. 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   Lipid Panel  Lab Results  Component Value Date   CHOL 155 05/10/2021   HDL 42 05/10/2021   LDLCALC 79 05/10/2021   TRIG 200 (H) 05/10/2021   CHOLHDL 3.7 05/10/2021   Needs to reduce fat in diet , TG elevated, no med change    Episodic cigarette smoking dependence Asked:confirms currently smokes cigarettes Assess: Unwilling to set a quit date, but is cutting back Advise: needs to QUIT to reduce risk of cancer, cardio and cerebrovascular disease Assist: counseled for 5 minutes and literature provided Arrange: follow up in 2 to 4 months   Polypharmacy contributes to fatigue  Well controlled type 2 diabetes mellitus with gastroparesis [facility]) [name] is reminded of the importance of commitment to daily physical activity for 30 minutes or more, as able and the need to limit carbohydrate intake to 30 to 60 grams per meal to help with blood sugar control.   The need to take medication as prescribed, test blood sugar as directed, and to call between visits if there is a concern that blood sugar is uncontrolled is also discussed.   [name] is reminded of the importamanaged by endo Updated lab needed at/ before next visit. nce of daily foot exam, annual eye examination, and good blood sugar, blood pressure and cholesterol control.  Diabetic Labs Latest Ref Rng & Units 05/10/2021 01/17/2021 12/23/2020 11/24/2020 11/18/2020  HbA1c 4.0 - 5.6 % - 5.9(A) - - -  Microalbumin Not Estab. ug/mL - - - - -  Micro/Creat Ratio 0 - 29 mg/g creat - - - - -  Chol 100 - 199 mg/dL 155 - - - -  HDL >39 mg/dL 42 - - - -  Calc LDL 0 - 99 mg/dL 79 - - - -  Triglycerides 0 - 149 mg/dL 200(H) - - - -  Creatinine 0.57 - 1.00 mg/dL 1.13(H) - 1.23(H) 1.10(H) 0.98  GFR >60.00 mL/min - - - - -   BP/Weight 05/10/2021 04/30/2021 03/04/2021 01/21/2021 01/17/2021 12/30/2020 87/57/9728  Systolic BP 206 015 615 379 130 432 761   Diastolic BP 68 64 71 54 82 84 85  Wt. (Lbs) 170 - 171.08 168 167.2 169.53 171  BMI 34.34 - 34.55 33.93 33.77 34.24 34.54  Some encounter information is confidential and restricted. Go to Review Flowsheets activity to see all data.   Foot/eye exam completion dates Latest Ref Rng & Units 05/26/2020 01/01/2019  Eye Exam No Retinopathy No Retinopathy -  Foot exam Order - - -  Foot Form Completion - - Done    Managd by Endo Updated lab needed at/ before next visit.

## 2021-05-10 NOTE — Patient Instructions (Addendum)
F/U in 4 months, call if yopu need me sooner  Lipid, cmp and EGFr today, already ordered  Give your body a chance to recover from Covid  Call [email or web address] for help with smoking cessation   Be careful not to fall  Thanks for choosing [facility], we consider it a privelige to serve you.

## 2021-05-11 ENCOUNTER — Encounter: Payer: Medicare HMO | Admitting: Family Medicine

## 2021-05-11 LAB — CMP14+EGFR
ALT: 24 IU/L (ref 0–32)
AST: 21 IU/L (ref 0–40)
Albumin/Globulin Ratio: 1.8 (ref 1.2–2.2)
Albumin: 4.2 g/dL (ref 3.8–4.8)
Alkaline Phosphatase: 89 IU/L (ref 44–121)
BUN/Creatinine Ratio: 26 (ref 12–28)
BUN: 29 mg/dL — ABNORMAL HIGH (ref 8–27)
Bilirubin Total: <0.2 mg/dL (ref 0.0–1.2)
CO2: 23 mmol/L (ref 20–29)
Calcium: 9.3 mg/dL (ref 8.7–10.3)
Chloride: 109 mmol/L — ABNORMAL HIGH (ref 96–106)
Creatinine, Ser: 1.13 mg/dL — ABNORMAL HIGH (ref 0.57–1.00)
Globulin, Total: 2.4 g/dL (ref 1.5–4.5)
Glucose: 51 mg/dL — ABNORMAL LOW (ref 70–99)
Potassium: 4.7 mmol/L (ref 3.5–5.2)
Sodium: 143 mmol/L (ref 134–144)
Total Protein: 6.6 g/dL (ref 6.0–8.5)
eGFR: 52 mL/min/{1.73_m2} — ABNORMAL LOW (ref >59)

## 2021-05-11 LAB — LIPID PANEL
Chol/HDL Ratio: 3.7 ratio (ref 0.0–4.4)
Cholesterol, Total: 155 mg/dL (ref 100–199)
HDL: 42 mg/dL (ref >39)
LDL Chol Calc (NIH): 79 mg/dL (ref 0–99)
Triglycerides: 200 mg/dL — ABNORMAL HIGH (ref 0–149)
VLDL Cholesterol Cal: 34 mg/dL (ref 5–40)

## 2021-05-12 ENCOUNTER — Telehealth: Payer: Medicare HMO

## 2021-05-12 ENCOUNTER — Encounter: Payer: Self-pay | Admitting: Family Medicine

## 2021-05-12 ENCOUNTER — Telehealth: Payer: Self-pay | Admitting: *Deleted

## 2021-05-12 DIAGNOSIS — R531 Weakness: Secondary | ICD-10-CM | POA: Insufficient documentation

## 2021-05-12 DIAGNOSIS — R5383 Other fatigue: Secondary | ICD-10-CM | POA: Insufficient documentation

## 2021-05-12 NOTE — Telephone Encounter (Signed)
?  Care Management  ? ?Follow Up Note ? ? ?05/12/2021 ?Name: [name] MRN: [id number] DOB: 09/03/1950 ? ? ?Referred by: [name], MD ?Reason for referral : Chronic Care Management (HLD, HTN, DM2, COPD) ? ? ?An unsuccessful telephone outreach was attempted today. The patient was referred to the case management team for assistance with care management and care coordination.  ? ?Follow Up Plan: Telephone follow up appointment with care management team member scheduled for: upon care guide rescheduling. ? ?Jacqlyn Larsen RNC, BSN ?RN Case Manager ?[place] ?[phone] ? ?

## 2021-05-12 NOTE — Assessment & Plan Note (Signed)
[name] is reminded of the importance of commitment to daily physical activity for 30 minutes or more, as able and the need to limit carbohydrate intake to 30 to 60 grams per meal to help with blood sugar control.  ? ?The need to take medication as prescribed, test blood sugar as directed, and to call between visits if there is a concern that blood sugar is uncontrolled is also discussed.  ? ?[name] is reminded of the importamanaged by endo ?Updated lab needed at/ before next visit. ?nce of daily foot exam, annual eye examination, and good blood sugar, blood pressure and cholesterol control. ? ?Diabetic Labs Latest Ref Rng & Units 05/10/2021 01/17/2021 12/23/2020 11/24/2020 11/18/2020  ?HbA1c 4.0 - 5.6 % - 5.9(A) - - -  ?Microalbumin Not Estab. ug/mL - - - - -  ?Micro/Creat Ratio 0 - 29 mg/g creat - - - - -  ?Chol 100 - 199 mg/dL 155 - - - -  ?HDL >39 mg/dL 42 - - - -  ?Calc LDL 0 - 99 mg/dL 79 - - - -  ?Triglycerides 0 - 149 mg/dL 200(H) - - - -  ?Creatinine 0.57 - 1.00 mg/dL 1.13(H) - 1.23(H) 1.10(H) 0.98  ?GFR >60.00 mL/min - - - - -  ? ?BP/Weight 05/10/2021 04/30/2021 03/04/2021 01/21/2021 01/17/2021 12/30/2020 12/23/2020  ?Systolic BP 520 802 233 612 130 142 148  ?Diastolic BP 68 64 71 54 82 84 85  ?Wt. (Lbs) 170 - 171.08 168 167.2 169.53 171  ?BMI 34.34 - 34.55 33.93 33.77 34.24 34.54  ?Some encounter information is confidential and restricted. Go to Review Flowsheets activity to see all data.  ? ?Foot/eye exam completion dates Latest Ref Rng & Units 05/26/2020 01/01/2019  ?Eye Exam No Retinopathy No Retinopathy -  ?Foot exam Order - - -  ?Foot Form Completion - - Done  ? ? ?Managd by Endo ?Updated lab needed at/ before next visit. ? ? ? ? ?

## 2021-05-12 NOTE — Assessment & Plan Note (Signed)
Noted at visit despite c/o insomnia ?

## 2021-05-12 NOTE — Assessment & Plan Note (Signed)
Controlled, no change in medication  

## 2021-05-12 NOTE — Assessment & Plan Note (Signed)
Multifactorial, recovering from covid, polypharmacy a lot of which is sedating, diabetes with hypoglycemia . ? Encouraged adequate hydration and frequent regular meals. Though reports insomnia, sleeping at visit when I entered  the room ?

## 2021-05-12 NOTE — Assessment & Plan Note (Signed)
contributes to fatigue ?

## 2021-05-12 NOTE — Assessment & Plan Note (Signed)
Hyperlipidemia:Low fat diet discussed and encouraged. ? ? ?Lipid Panel  ?Lab Results  ?Component Value Date  ? CHOL 155 05/10/2021  ? HDL 42 05/10/2021  ? [place] 79 05/10/2021  ? TRIG 200 (H) 05/10/2021  ? CHOLHDL 3.7 05/10/2021  ? ?Needs to reduce fat in diet , TG elevated, no med change ? ? ?

## 2021-05-12 NOTE — Assessment & Plan Note (Signed)
Asked:confirms currently smokes cigarettes °Assess: Unwilling to set a quit date, but is cutting back °Advise: needs to QUIT to reduce risk of cancer, cardio and cerebrovascular disease °Assist: counseled for 5 minutes and literature provided °Arrange: follow up in 2 to 4 months ° °

## 2021-05-13 ENCOUNTER — Other Ambulatory Visit: Payer: Self-pay | Admitting: Internal Medicine

## 2021-05-13 MED ORDER — LORAZEPAM 1 MG PO TABS: 1.0000 mg | ORAL_TABLET | Freq: Once | ORAL | 0 refills | Status: DC | PRN

## 2021-05-14 ENCOUNTER — Encounter: Payer: Self-pay | Admitting: Internal Medicine

## 2021-05-15 NOTE — Progress Notes (Signed)
HPI ?F Former smoker followed for obstructive sleep apnea with hypersomnia, , R/O'd Lung nodule,Wheezing dyspnea, complicated by hx resection of meningioma. Bipolar, GERD, DM, chronic epistaxis ?NPSG 3/8/13[name]- mild obstructive sleep apnea, AHI 14 per hour, mostly in REM. Epworth sleepiness score 14/24. Weight 164 pounds. ?She is living alone, divorced and disabled. Son has OSA. ?History of meningioma 11/23/2011. No seizure in over 3 years. Does not drive ?Office Spirometry 10/30/2013-submaximal effort based on appearance of loop and curve. Numbers would fit with severe restriction but her physiologic capability may be better than this. FVC 0.91/44%, FEV1 0.74/45%, FEV1/FVC 0.81, FEF 25-75% 1.43/69%. ?Office Spirometry 07/27/2015-weak effort but still better than her last previous trial effort. Mild restriction of exhaled volume. FVC 1.35/67%, FEV1 1.27/79%, FEV1/FVC 0.94, FEF 25-75 percent 2.14/107% ?-------------------------------------------------------------------------------------------------------- ? ? ?11/17/20- 70 yoF Smoker followed for Obstructive Sleep Apnea with hypersomnia, , R/O'd Lung nodule,Wheezing dyspnea/ tobacco, complicated by hx resection of meningioma. Bipolar/ Depression, GERD, DM,HTN,  chronic epistaxis  ?CPAP 5-12 auto/ [employer])      Dream Station 2 Auto ?Still smoking 5-10 cigs/ day ?Download- Compliance 0    used 26.7% of days, all < 4 hours, AHI 15.9/ hr ?Body weight today- ?Covid vax- ?She finally got her recalled Dreamstation CPAP replacement and has an appointment at end of week at [facility] to learn how to use it. ?No breathing exacerbation. Finds Stiolto hard to twist to operate  We are going to try samples of Trelegy.  ?CT low dose screening 07/14/20- ?IMPRESSION: ?1. Lung-RADS 1, negative. Continue annual screening with low-dose ?chest CT without contrast in 12 months. ?2. Coronary artery calcifications. ?Aortic Atherosclerosis (ICD10-I70.0) and Emphysema  (ICD10-J43.9). ? ?05/17/21- 75 yoF Smoker followed for Obstructive Sleep Apnea with hypersomnia, , R/O'd Lung nodule,Wheezing dyspnea/ tobacco, complicated by hx resection of meningioma. Bipolar/ Depression, GERD, DM,HTN,  chronic epistaxis, Covid infection [id number],  ?CPAP 5-12 auto/ [facility] [name])      Dream Station 2 Auto replaced Sept 2022 ?Still smoking 5-10 cigs/ day ?Download- Compliance 40%, AHI 8.2/ hr ?Body weight today- ?Covid vax-3 Moderna ?Flu vax-had ?-----Patient states that she would like to talk about something to help her quit smoking. States that she sleeps good mostly but sometimes depends on the pain she is in and then she does not sleep good.  ?Download reviewed and goals reemphasized.  She says her mask is broken and she has contacted [employer], awaiting replacement. ?Discussed smoking cessation.  She is willing to have the pharmacy team contact her. ?Always somewhat sleepy, little changed over years.  Not clear how much of this reflects CPAP use, medications, organic.  I looked at her medication list with her and I am reluctant to add additional meds.  Emphasized sleep hygiene, CPAP use and short naps. ?CXR 11/24/20 1V- ?IMPRESSION: ?Low lung volumes with atelectasis and/or scarring. ? ?ROS-see HPI + = positive ?Constitutional:   No-   weight loss, night sweats, fevers, chills, +fatigue, lassitude. ?HEENT:  + headaches, +difficulty swallowing, tooth/dental problems, sore throat,  ?     No-sneezing, itching, ear ache, nasal congestion, post nasal drip,  ?CV:  chest pain, orthopnea, PND, swelling in lower extremities, anasarca, dizziness, +palpitations ?Resp: +  shortness of breath with exertion or at rest.   ?             productive cough,   non-productive cough,  No- coughing up of blood.   ?           No-   change in  color of mucus.   wheezing.   ?Skin: No-   rash or lesions. ?GI:  +heartburn, +indigestion, no-abdominal pain, nausea, vomiting,  ?GU:  ?MS:  + joint pain or swelling.   ?Neuro-      nothing unusual ?Psych:  No- change in mood or affect. +depression or +anxiety.  No memory loss. ? ?OBJ- Physical Exam - + she speaks slowly but seems alert and appropriate, +obese ?General-  Oriented, Affect-flat, Distress- none acute ?Skin- rash-none, lesions- none, excoriation- none ?Lymphadenopathy- none ?Head- Eyes- Gross vision intact, PERRLA, conjunctivae and secretions clear ?           Ears- Hearing, canals-normal + = positive ?           Nose- no-Septal dev, polyps, erosion, perforation  ?           Throat- Mallampati III , mucosa clear , drainage- none, tonsils- atrophic,  ?+ dentures ?Neck- flexible , trachea midline, no stridor , thyroid nl, carotid no bruit ?Chest - symmetrical excursion , unlabored ?          Heart/CV- RRR , no murmur , no gallop  , no rub, nl s1 s2 ?                          - JVD- none , edema- none, stasis changes- none, varices- none ?          Lung- clear, wheeze- none, cough- mild , dullness-none, rub- none ?          Chest wall-  ?Abd-  ?Br/ Gen/ Rectal- Not done, not indicated ?Extrem- cyanosis- none, clubbing, none, atrophy- none, strength- nl. +crolling walker ?Neuro- + halting speech ? ? ? ? ? ? ? ?

## 2021-05-16 ENCOUNTER — Telehealth: Payer: Self-pay | Admitting: Family Medicine

## 2021-05-16 ENCOUNTER — Other Ambulatory Visit: Payer: Self-pay

## 2021-05-16 ENCOUNTER — Ambulatory Visit (INDEPENDENT_AMBULATORY_CARE_PROVIDER_SITE_OTHER): Payer: Medicare HMO | Admitting: Psychiatry

## 2021-05-16 ENCOUNTER — Encounter (HOSPITAL_COMMUNITY): Payer: Self-pay

## 2021-05-16 DIAGNOSIS — F331 Major depressive disorder, recurrent, moderate: Secondary | ICD-10-CM

## 2021-05-16 NOTE — Progress Notes (Signed)
Virtual Visit via Telephone Note ? ?I connected with [name] on 05/16/21 at 4:06 PM EST  by telephone and verified that I am speaking with the correct person using two identifiers. ? ?Location: ?Patient: Home ?Provider: [place] office  ?  ?I discussed the limitations, risks, security and privacy concerns of performing an evaluation and management service by telephone and the availability of in person appointments. I also discussed with the patient that there may be a patient responsible charge related to this service. The patient expressed understanding and agreed to proceed. ? ? ?. ? ?I provided 44 minutes of non-face-to-face time during this encounter. ? ? ?Meron Bocchino E Hayden Kihara, LCSW ? ? ? ?   ?        ?THERAPIST PROGRESS NOTE   ? ?Session Time: Monday 05/16/2021 4:06 PM - 4:50 PM  ? ?Participation Level: Active ? ?Behavioral Response: depressed, talkative, alert  ? ?Type of Therapy: Individual Therapy ? ?Treatment Goals addressed: Patient will score less than 10 on the patient health questionnaire ? ?Progress on goals: Progressing ? ?Interventions: CBT and Supportive ?            ?Summary: [name] is a 71 y.o. female who presents with  long standing history of recurrent periods  of depression beginning when she was 84 and her favorite uncle died. Patient reports multiple psychiatric hospitlaizations due to depression and suicidal ideations with the last one occuring in 1997. Patient has participated in outpatient psychotherapy and medication management intermittently since age 36.  She currently is seeing psychiatrist Dr. Harrington Challenger . Prior to this, she was seen at [facility]. Patient also has had ECT at [facility]. Symptoms have worsened in recent months due to family stress and have  included depressed mood, anxiety, excessive worry, and tearfulness.        ? ?Patient's last contact was by virtual visit via telephone about 2-3 weeks ago.   She reports still experiencing symptoms of depression as  well as being minimally and physically tired from having COVID.  However, she reports she resumed attending church yesterday and enjoyed this.  She continues to express frustration and sadness her son and grandchildren do not have more contact with her.  She reports increased irritability and sometimes being  aggressive rather than assertive and expressing her concerns and needs.  She also expresses frustration regarding continued medical issues.  However she is pleased that she has seen a nephrologist who is addressing issues regardomg her kidney.  Patient reports she still plans to contact the [employer] to try to get more involved in activities to improve her health.   Suicidal/Homicidal: Nowithout intent/plan ?     ?Therapist Response: Reviewed symptoms, praised and reinforced patient's efforts to resume involvement in activities, discussed stressors, facilitated expression of thoughts and feelings, validated feelings, assisted patient identify realistic expectations of her family, assisted patient try to identify other activities to increase social contact/involvement, encourage patient to follow through with plans to contact [employer], revised/develop treatment plan, obtained patient's permission to electronically signed plan for patient as this was a virtual visit . ? ?Diagnosis: Axis I: MDD, Recurrent, moderate ? ?  Axis II: Deferred ?Collaboration of Care: Psychiatrist AEB patient working with psychiatrist Dr. Harrington Challenger ? ?Patient/Guardian was advised Release of Information must be obtained prior to any record release in order to collaborate their care with an outside provider. Patient/Guardian was advised if they have not already done so to contact the registration department to sign all  necessary forms in order for [name] to release information regarding their care.  ? ?Consent: Patient/Guardian gives verbal consent for treatment and assignment of benefits for services provided during this visit. Patient/Guardian expressed  understanding and agreed to proceed.  ? ? ?Neale Marzette E Myana Schlup, LCSW ? ? ? ?

## 2021-05-16 NOTE — Telephone Encounter (Signed)
Please advise 

## 2021-05-16 NOTE — Plan of Care (Signed)
Pt  participated in development of plan ?

## 2021-05-16 NOTE — Telephone Encounter (Signed)
Patient called in regard to lab results, and covid results.  ? ?Pt has question on if still has covid or is contagious. ? ?Patient aid has tested covid positive and home health is stating that patient is still covid positive. Patient also went to church on Sunday 3/5 ? ? ?Patient would like a call back in regards ?

## 2021-05-17 ENCOUNTER — Ambulatory Visit (INDEPENDENT_AMBULATORY_CARE_PROVIDER_SITE_OTHER): Payer: Medicare HMO

## 2021-05-17 ENCOUNTER — Telehealth: Payer: Self-pay | Admitting: Internal Medicine

## 2021-05-17 ENCOUNTER — Other Ambulatory Visit: Payer: Self-pay

## 2021-05-17 ENCOUNTER — Ambulatory Visit (INDEPENDENT_AMBULATORY_CARE_PROVIDER_SITE_OTHER): Payer: Medicare HMO | Admitting: Internal Medicine

## 2021-05-17 ENCOUNTER — Encounter: Payer: Self-pay | Admitting: Internal Medicine

## 2021-05-17 VITALS — BP 116/60 | HR 60 | Temp 98.1°F | Ht 59.0 in | Wt 167.4 lb

## 2021-05-17 DIAGNOSIS — J9811 Atelectasis: Secondary | ICD-10-CM | POA: Diagnosis not present

## 2021-05-17 DIAGNOSIS — G4733 Obstructive sleep apnea (adult) (pediatric): Secondary | ICD-10-CM

## 2021-05-17 DIAGNOSIS — Z720 Tobacco use: Secondary | ICD-10-CM | POA: Diagnosis not present

## 2021-05-17 NOTE — Assessment & Plan Note (Signed)
CPAP remains the most appropriate treatment for her.  Have again emphasized keeping equipment and working order and using it appropriately. ?Plan-work with [employer] to replace broken mask, continue auto 5-12 ?

## 2021-05-17 NOTE — Patient Instructions (Signed)
Order- DME [name]-   continue CPAP auto 5-10, mask of choice, humidifier, supplies, AirView/ card. She has requested replacement for broken mask. ? ?Order- CXR   dx Tobacco User ? ?Order- Pharmacy Smoking Cessation Team ? ? ?

## 2021-05-17 NOTE — Telephone Encounter (Signed)
Order-  Refer to Pharmacy Smoking Cessation Team she is asking for something to help her stop smoking ?

## 2021-05-17 NOTE — Assessment & Plan Note (Signed)
Smoking cessation goals reviewed. ?Plan-CXR, pharmacy smoking team ?

## 2021-05-19 ENCOUNTER — Encounter: Payer: Self-pay | Admitting: Internal Medicine

## 2021-05-19 ENCOUNTER — Other Ambulatory Visit: Payer: Self-pay

## 2021-05-19 ENCOUNTER — Ambulatory Visit (INDEPENDENT_AMBULATORY_CARE_PROVIDER_SITE_OTHER): Payer: Medicare HMO | Admitting: Internal Medicine

## 2021-05-19 ENCOUNTER — Telehealth: Payer: Self-pay

## 2021-05-19 VITALS — BP 120/80 | HR 67 | Ht 59.0 in | Wt 167.6 lb

## 2021-05-19 DIAGNOSIS — E039 Hypothyroidism, unspecified: Secondary | ICD-10-CM | POA: Diagnosis not present

## 2021-05-19 DIAGNOSIS — E042 Nontoxic multinodular goiter: Secondary | ICD-10-CM

## 2021-05-19 DIAGNOSIS — E1165 Type 2 diabetes mellitus with hyperglycemia: Secondary | ICD-10-CM

## 2021-05-19 DIAGNOSIS — Z79899 Other long term (current) drug therapy: Secondary | ICD-10-CM

## 2021-05-19 DIAGNOSIS — Z794 Long term (current) use of insulin: Secondary | ICD-10-CM | POA: Diagnosis not present

## 2021-05-19 DIAGNOSIS — E1143 Type 2 diabetes mellitus with diabetic autonomic (poly)neuropathy: Secondary | ICD-10-CM

## 2021-05-19 NOTE — Telephone Encounter (Signed)
Patient called said she can not get no aid to come back to her home since she had COVID on 02.13.2023.  She needs the aid to come back to her home.  ?

## 2021-05-19 NOTE — Patient Instructions (Addendum)
Please continue: ?- Toujeo 24 units daily (30 units after steroid injections) ?- Novolog  ?14 units for smaller meals ?16 units for large meals ?(20-24 units after steroid injections) ? ?Please continue levothyroxine 50 mcg daily. ? ?Take the thyroid hormone every day, with water, at least 30 minutes before breakfast, separated by at least 4 hours from: ?- acid reflux medications ?- calcium ?- iron ?- multivitamins ? ?PLEASE  MOVE ACIPHEX AT LEAST 4 HOURS AFTER LEVOTHYROXINE. ? ?Please stop at the lab. ? ?Please return in 4 months. ?

## 2021-05-19 NOTE — Progress Notes (Signed)
Patient ID: [name], female   DOB: 1950/04/27, 71 y.o.   MRN: [id number]  This visit occurred during the SARS-CoV-2 public health emergency.  Safety protocols were in place, including screening questions prior to the visit, additional usage of staff PPE, and extensive cleaning of exam room while observing appropriate contact time as indicated for disinfecting solutions.   HPI: [name] is a 71 y.o.-year-old female, initially referred by her PCP, [name], presenting for follow-up for DM2, dx 1995, insulin-dependent since 1997, uncontrolled, with complications (gastroparesis, cerebrovasc. Ds-  H/o stroke, PN),  Hypothyroidism, thyroid nodules.  Last visit 5.5 months ago.  Interim history: At last visit she was still on steroid injections in her shoulder and sugars were high.  She is seeing pain medicine. She also continues to have neck pain, headaches, disequilibrium.  She finished physical therapy. She has blurry vision but no chest pain, palpitations, or increased urination. She had COVID-19 in 04/2021. She still feels very tired.   DM2: Reviewed HbA1c levels: Lab Results  Component Value Date   HGBA1C 5.9 (A) 01/17/2021   HGBA1C 5.8 (A) 07/29/2020   HGBA1C 5.3 03/25/2020  10/28/2013: HbA1c 8.5%  She is on: - Toujeo 23 >> 28 >> 32 >> 24 units at bedtime - Novolog: 10-14 >> 16-18  >> 14-16units before a meal (20-22 units while on steroids) Could not tolerate Metformin >> nausea.   Insulin doses were decreased in 04/2017 due to low blood sugars in the 40s.  Pt checks her sugars 4 times a day per review of her log: - am:  46, 60, 81-169, 199, 228 >> 81-147, 168 >> 99-160, 270-335 (high with steroids) - 2h after b'fast: n/c >> 110 >> n/c - before lunch: 69-153, 166, 214 >> 57, 95-150, 178, 193 >> 103-171, 200-385 (high with steroids) - 2h after lunch:  n/c >> 127, 215, 241 >> n/c - before dinner: 70-185, 241-282 with steroids >> 106-191, 196 >> 91-176 (266-401) (high with  steroids) - 2h after dinner: n/c - bedtime: 91, 121-85, 204, 262 >> 99-272, 365 >> 60, 104-173 >> 83-190, 194-414 - nighttime: n/c >> 134 >> n/c Lowest sugar was 64 >> 40s >> ; she has hypoglycemia awareness in the 70s. Highest sugar was 542 (steroid inj) ... >> 395 (steroid) >> 414 (steroid)  Pt's meals are: - Breakfast: eggs, cereals, fruit, oatmeal, bacon - Lunch: sandwich, beef, mashed potatoes,diet soda - Dinner: chicken, beef, fish, potatoes, vegetables - Snacks: 1-2: fruit  -+ Mild CKD; last BUN/creatinine:  Lab Results  Component Value Date   BUN 29 (H) 05/10/2021   CREATININE 1.13 (H) 05/10/2021  On losartan.  -+ HL; last set of lipids: Lab Results  Component Value Date   CHOL 155 05/10/2021   HDL 42 05/10/2021   LDLCALC 79 05/10/2021   TRIG 200 (H) 05/10/2021   CHOLHDL 3.7 05/10/2021  On Crestor and Zetia.  - last eye exam was in 11/2020: + DR; Had cataract sx B. Dr. Hassell Done [facility]). + L eye pain.  -She has numbness and tingling mainly in the left leg-affected by stroke. Sees podiatry.  She had foot exam by podiatry 02/16/2021.  Hypothyroidism: -Diagnosed many years ago  She takes levothyroxine 50 mcg daily: - in am - fasting - at least 30 min from b'fast - no calcium - no iron - + multivitamins with lunch  - + Aciphex with levothyroxine - as these are together in her pill box as organized by home health... - no  PPIs - +on Biotin and B complex  Reviewed her TFTs: Lab Results  Component Value Date   TSH 0.631 12/11/2019   TSH 0.94 02/17/2019   TSH 0.82 09/30/2018   TSH 0.52 10/08/2017   TSH 0.827 10/15/2016   Thyroid nodules.    Pt denies: - feeling nodules in neck - hoarseness - SOB with lying down But she continues to have chronic dysphagia with certain foods.  She had previous esophageal dilations. She also has choking.  I reviewed previous imaging and biopsy tests: FNA (2014) x2: Benign  thyroid U/S (10/2013): Right thyroid lobe: 3.7 x  1.0 x 1.8 cm  Left thyroid lobe: 3.5 x 1.5 x 1.6 cm  Isthmus: 0.5 cm  Focal nodules:  There is a 0.3 mm calcified nodule in the right midzone.  There is a 0.6 x 0.7 x 0.5 cm hypoechoic nodule in the  lateral aspect of the right midzone.  There is a 0.7 x 0.6 x 0.5 cm hyperechoic nodule in the lateral aspect of the right lower pole.  There is a 2.2 x 1.1 x 1.7 cm complex solid nodule in the inferior aspect of the isthmus extending adjacent to the left lower pole.  There is a 1.4 x 2.1 x 1.4 cm complex solid nodule in the left lower pole.  Lymphadenopathy: None visualized.  Repeat U/S (01/2014): Stable appearance of the nodules  Repeat U/S (10/2015): stable appearance of the nodules  Repeat U/S (10/2016): Stable appearance of the nodules  Repeat U/S (02/02/2021): Stable appearance of nodules  She had a L rib fracture in 12/2014. She had a R ankle fracture 02/20/2020 >> bone stimulator to help with healing. Patient was admitted with AMS + sepsis on 10/15/2016.  She had aspiration pneumonia and acute respiratory failure She has a recurrent meningioma >> seeing Dr. Rita Ohara.  ROS: + see HPI Neurological: no tremors/+ numbness/+ tingling/no dizziness  I reviewed pt's medications, allergies, PMH, social hx, family hx, and changes were documented in the history of present illness. Otherwise, unchanged from my initial visit note.  Past Medical History:  Diagnosis Date   Allergy    Anemia    Anxiety    takes Ativan daily   Arthritis    Assistance needed for mobility    Bipolar disorder ([place])    takes Risperdal nightly   Blood transfusion    Brain tumor ([place])    Cancer ([place])    In her gum   Carpal tunnel syndrome of right wrist 05/23/2011   Cervical disc disorder with radiculopathy of cervical region 10/31/2012   Chronic back pain    Chronic idiopathic constipation    Chronic neck and back pain    Colon polyps    COPD (chronic obstructive pulmonary disease) with chronic  bronchitis ([place]) 09/16/2013   Office Spirometry 10/30/2013-submaximal effort based on appearance of loop and curve. Numbers would fit with severe restriction but her physiologic capability may be better than this. FVC 0.91/44%, and 10.74/45%, FEV1/FVC 0.81, FEF 25-75% 1.43/69%     Diabetes mellitus    Type II   Diverticulosis    TCS 9/08 by Dr. Delfin Edis for diarrhea . Bx for micro scopic colitis negative.    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   Metabolic encephalopathy 06/20/7351   Migraines    chronic headaches   Mononeuritis lower limb    Narcolepsy    Osteoporosis    Pancreatitis 2006   due to Depakote with normal EUS    Schatzki's ring    non critical / EGD with ED 8/2011with RMR   Seizures ([place])    takes Lamictal daily.Last seizure 3 yrs ago   Sleep apnea    on CPAP   Small bowel obstruction (H[facility])    Stroke ([place])    left sided weakness, speech changes   Tubular adenoma of colon    Past Surgical History:  Procedure Laterality Date   ABDOMINAL HYSTERECTOMY  1978   BACK SURGERY  July 2012   BACTERIAL OVERGROWTH TEST N/A 05/05/2013   Procedure: BACTERIAL OVERGROWTH TEST;  Surgeon: Daneil Dolin, MD;  [facility];  Service: Endoscopy;  Laterality: N/A;  7:30   BIOPSY THYROID  2009   BRAIN SURGERY  11/2011   resection of meningioma   BREAST REDUCTION SURGERY  1994   CARDIAC CATHETERIZATION  05/10/2005   normal coronaries, normal LV  systolic function and EF (Dr. Jackie Plum)   [place] Left 07/22/04   Dr. Aline Brochure   CATARACT EXTRACTION Bilateral    CHOLECYSTECTOMY  1984   COLONOSCOPY N/A 09/25/2012   GDJ:MEQASTM diverticulosis.  colonic polyp-removed : tubular adenoma   CRANIOTOMY  11/23/2011   Procedure: CRANIOTOMY TUMOR EXCISION;  Surgeon: Hosie Spangle, MD;  Location: [place];  Service: Neurosurgery;  Laterality: N/A;  Craniotomy for tumor resection   ESOPHAGOGASTRODUODENOSCOPY  12/29/2010   [place]-Retained food in the esophagus and stomach, small hiatal hernia, status post Maloney dilation of the esophagus   ESOPHAGOGASTRODUODENOSCOPY N/A 09/25/2012   HDQ:QIWLNLGX atonic baggy esophagus status post Maloney dilation 37 F. Hiatal hernia   GIVENS CAPSULE STUDY N/A 01/15/2013   NORMAL.    IR GENERIC HISTORICAL  03/17/2016   IR RADIOLOGIST EVAL & MGMT 03/17/2016 [facility]   LESION REMOVAL N/A 05/31/2015   Procedure: REMOVAL RIGHT AND LEFT LESIONS OF MANDIBLE;  Surgeon: Diona Browner, DDS;  Location: [place];  Service: Oral Surgery;  Laterality: N/A;   MALONEY DILATION  12/29/2010   RMR;   NM MYOCAR PERF WALL MOTION  2006   "relavtiely normal" persantine, mild anterior thinning (breast attenuation artifact), no region of scar/ischemia   OVARIAN CYST REMOVAL     RECTOCELE REPAIR N/A 06/29/2015   Procedure: POSTERIOR REPAIR (RECTOCELE);  Surgeon: Jonnie Kind, MD;  Location: [facility];  Service: Gynecology;  Laterality: N/A;   REDUCTION MAMMAPLASTY Bilateral    SPINE SURGERY  09/29/2010   Dr. Rolena Infante   surgical excision of 3 tumors from right thigh and right buttock  and left upper thigh  2010   TOOTH EXTRACTION Bilateral 12/14/2014   Procedure: REMOVAL OF BILATERAL MANDIBULAR EXOSTOSES;  Surgeon: Diona Browner, DDS;  Location: [place];  Service: Oral Surgery;  Laterality: Bilateral;   TRANSTHORACIC ECHOCARDIOGRAM  2010   EF 60-65%, mild conc LVH, grade 1 diastolic dysfunction; mildly calcified MV annulus with  mildly thickened leaflets, mildly calcified MR annulus  History   Social History   Marital Status: Divorced    Spouse Name: N/A    Number of Children: 1   Years of Education: 12   Occupational History   disabled     Social History Main Topics   Smoking status: Current Every Day Smoker -- 0.25 packs/day for 7 years    Types: Cigarettes   Smokeless tobacco: Never Used     Comment: "started back but off now for 3 months" (08/18/13)   Alcohol Use: No     Comment:     Drug Use: No   Current Outpatient Medications on File Prior to Visit  Medication Sig Dispense Refill   ACCU-CHEK GUIDE test strip USE TO CHECK BLOOD SUGAR FOUR TIMES A DAY AND AS NEEDED (NEED MD APPOINTMENT FOR ANY FURTHER REFILLS) 400 strip 2   Accu-Chek Softclix Lancets lancets TEST BLOOD SUGAR THREE TIMES DAILY AS DIRECTED 300 each 2   albuterol (VENTOLIN HFA) 108 (90 Base) MCG/ACT inhaler INHALE 1 TO 2 PUFFS EVERY 6 HOURS AS NEEDED FOR WHEEZING, SHORTNESS OF BREATH 3 each 1   Alcohol Swabs (DROPSAFE ALCOHOL PREP) 70 % PADS USE TO CLEAN FINGER PRIOR TO STICKING FOR BLOOD SUGAR. 200 each 0   alendronate (FOSAMAX) 70 MG tablet TAKE 1 TABLET (70 MG TOTAL) BY MOUTH EVERY 7 (SEVEN) DAYS. TAKE WITH A FULL GLASS OF WATER ON AN EMPTY STOMACH. 12 tablet 3   amLODipine (NORVASC) 10 MG tablet TAKE 1 TABLET EVERY DAY (Patient taking differently: Take 10 mg by mouth at bedtime.) 90 tablet 1   ascorbic acid (VITAMIN C) 500 MG tablet Take 500 mg by mouth daily.     aspirin EC 81 MG tablet Take 1 tablet (81 mg total) by mouth daily with breakfast. 120 tablet 2   benzonatate (TESSALON) 100 MG capsule Take 1 capsule (100 mg total) by mouth 2 (two) times daily as needed for cough. 20 capsule 0   betamethasone dipropionate 0.05 % cream betamethasone dipropionate 0.05 % topical cream     Blood Glucose Calibration (ACCU-CHEK GUIDE CONTROL) LIQD USE AS DIRECTED 1 each 0   blood glucose meter kit and supplies Dispense based on patient and  insurance preference. Use up to four times daily as directed. (FOR ICD-10 E10.9, E11.9). 1 each 0   buPROPion (WELLBUTRIN XL) 150 MG 24 hr tablet Take 1 tablet (150 mg total) by mouth every morning. 90 tablet 2   cetirizine (ZYRTEC) 10 MG tablet Take 1 tablet (10 mg total) by mouth daily. 30 tablet 1   Clobetasol Prop Emollient Base (CLOBETASOL PROPIONATE E) 0.05 % emollient cream Apply to affected area qd 60 g 6   Continuous Blood Gluc Sensor (FREESTYLE LIBRE 14 DAY SENSOR) MISC 1 each by Does not apply route every 14 (fourteen) days. Change every 2 weeks 2 each 11   diclofenac Sodium (VOLTAREN) 1 % GEL APPLY 2 GRAMS  TOPICALLY FOUR TIMES DAILY. (Patient taking differently: Apply 2 g topically 4 (four) times daily.) 700 g 1   dicyclomine (BENTYL) 10 MG capsule Take by mouth.     Docusate Sodium (DSS) 100 MG CAPS Take by mouth.     DROPLET PEN NEEDLES 31G X 8 MM MISC USE FOR INJECTING INSULIN 4 TIMES DAILY. 400 each 0   ezetimibe (ZETIA) 10 MG tablet TAKE 1 TABLET EVERY DAY (Patient taking differently: Take 10 mg by mouth daily.) 90 tablet 1   famotidine (PEPCID) 40 MG tablet Take 40 mg by mouth 2 (two)  times daily.     guaiFENesin (MUCINEX) 600 MG 12 hr tablet Take 1 tablet (600 mg total) by mouth 2 (two) times daily. 20 tablet 0   hydroxychloroquine (PLAQUENIL) 200 MG tablet Take by mouth.     hydrOXYzine (ATARAX/VISTARIL) 10 MG tablet Take 1 tablet (10 mg total) by mouth 3 (three) times daily as needed. (Patient taking differently: Take 10 mg by mouth 3 (three) times daily as needed for itching or anxiety.) 30 tablet 0   insulin glargine, 2 Unit Dial, (TOUJEO MAX SOLOSTAR) 300 UNIT/ML Solostar Pen Inject 24 Units into the skin at bedtime. (Patient taking differently: Inject 32 Units into the skin at bedtime.) 9 mL 1   lamoTRIgine (LAMICTAL) 100 MG tablet Take 1 tablet (100 mg total) by mouth 2 (two) times daily. 180 tablet 2   levothyroxine (SYNTHROID) 50 MCG tablet Take 1 tablet (50 mcg) 6 days  1/2 tablet (25 mcg) 1 day weekly. 80 tablet 3   LORazepam (ATIVAN) 0.5 MG tablet Take 1 tablet (0.5 mg total) by mouth 2 (two) times daily. 60 tablet 2   LORazepam (ATIVAN) 1 MG tablet Take 1 tablet (1 mg total) by mouth once as needed for up to 1 dose for anxiety (MRI claustrophobia). 3 tablet 0   losartan (COZAAR) 100 MG tablet Take 1 tablet (100 mg total) by mouth daily. (Patient taking differently: Take 100 mg by mouth at bedtime.) 30 tablet 2   meclizine (ANTIVERT) 25 MG tablet Take 1 tablet (25 mg total) by mouth 3 (three) times daily as needed for dizziness. 30 tablet 0   methocarbamol (ROBAXIN) 500 MG tablet Take one tablet by mouth two times daily , as needed, for neck and back pain and spasm (Patient taking differently: Take 500 mg by mouth 2 (two) times daily as needed for muscle spasms (for neck and back pain and spasm).) 20 tablet 0   metoprolol tartrate (LOPRESSOR) 50 MG tablet TAKE 1 TABLET TWICE DAILY (NEED MD APPOINTMENT) (Patient taking differently: Take 50 mg by mouth 2 (two) times daily.) 180 tablet 3   mirabegron ER (MYRBETRIQ) 25 MG TB24 tablet Take 1 tablet (25 mg total) by mouth daily. 90 tablet 1   Misc. Devices (MATTRESS PAD) MISC FIRM MATTRESS PAD for hospital bed x 1 1 each 0   montelukast (SINGULAIR) 10 MG tablet TAKE 1 TABLET EVERY DAY (Patient taking differently: Take 10 mg by mouth daily.) 90 tablet 1   NOVOLOG FLEXPEN 100 UNIT/ML FlexPen INJECT 16 TO 18 UNITS UNDER SKIN UP TO 3 TIMES A DAY BEFORE MEALS  (DISCARD PEN 28 DAYS AFTER OPENING) 60 mL 0   nystatin (MYCOSTATIN/NYSTOP) powder APPLY TO AFFECTED AREA 4 TIMES DAILY. 90 g 1   Omega-3 Fatty Acids (FISH OIL PO) Take 1 capsule by mouth daily.     oxyCODONE-acetaminophen (PERCOCET) 10-325 MG tablet Take 1 tablet by mouth every 8 (eight) hours as needed for pain.     Plecanatide (TRULANCE) 3 MG TABS Take 3 mg by mouth daily as needed. 30 tablet 0   pregabalin (LYRICA) 75 MG capsule Take 1 capsule (75 mg total) by mouth  daily.     RABEprazole (ACIPHEX) 20 MG tablet TAKE 1 TABLET TWICE DAILY 180 tablet 0   RESTASIS 0.05 % ophthalmic emulsion Place 2 drops into both eyes daily.     risperiDONE (RISPERDAL) 0.5 MG tablet Take 1 tablet (0.5 mg total) by mouth at bedtime. 90 tablet 2   rosuvastatin (CRESTOR) 5 MG tablet TAKE 1  TABLET AT BEDTIME 90 tablet 2   sertraline (ZOLOFT) 100 MG tablet Take 1 tablet (100 mg total) by mouth daily. 90 tablet 2   spironolactone (ALDACTONE) 25 MG tablet Take 1 tablet (25 mg total) by mouth daily. 30 tablet 2   STIOLTO RESPIMAT 2.5-2.5 MCG/ACT AERS INHALE 2 PUFFS INTO THE LUNGS DAILY. (Patient taking differently: Inhale 2 puffs into the lungs daily.) 12 g 1   traZODone (DESYREL) 150 MG tablet Take 1 tablet (150 mg total) by mouth at bedtime. 90 tablet 2   UNABLE TO [facility] bed mattress x 1  DX: G47.33, J44.9 1 each 0   UNABLE TO FIND Standard wheelchair Dx M47.16, R29.898 1 each 0   UNABLE TO FIND Med Name: Activice cooling gel spray on arms, legs, and back     UNABLE TO [facility] bed with orthopedic mattress x 1 DX G47.33, G89.4 1 each 0   No current facility-administered medications on file prior to visit.      Allergies  Allergen Reactions   Cephalexin Hives   Iron Nausea And Vomiting   Milk-Related Compounds Other (See Comments)    Doesn't agree with stomach.    Penicillins Hives        Phenazopyridine Hcl Hives         Family History  Problem Relation Age of Onset   Heart attack Mother        HTN   Pneumonia Father    Kidney failure Father    Diabetes Father    Pancreatic cancer Sister    Cancer Sister        breast    Cancer Sister        pancreatic   Diabetes Brother    Hypertension Brother    Diabetes Brother    Alcohol abuse Maternal Uncle    Stroke Maternal Grandmother    Heart attack Maternal Grandfather    Hypertension Son    Sleep apnea Son    Colon cancer Neg Hx    Anesthesia problems Neg Hx    Hypotension Neg Hx     Malignant hyperthermia Neg Hx    Pseudochol deficiency Neg Hx    Breast cancer Neg Hx    Stomach cancer Neg Hx    PE: BP 120/80 (BP Location: Right Arm, Patient Position: Sitting, Cuff Size: Normal)    Pulse 67    Ht 4' 11"  (1.499 m)    Wt 167 lb 9.6 oz (76 kg)    SpO2 95%    BMI 33.85 kg/m  Body mass index is 33.85 kg/m.  Wt Readings from Last 3 Encounters:  05/19/21 167 lb 9.6 oz (76 kg)  05/17/21 167 lb 6.4 oz (75.9 kg)  05/10/21 170 lb (77.1 kg)   Constitutional: overweight, in NAD Eyes: PERRLA, EOMI, no exophthalmos ENT: moist mucous membranes, no thyromegaly, no cervical lymphadenopathy Cardiovascular: RRR, No MRG Respiratory: CTA B Gastrointestinal: abdomen soft, NT, ND, BS+ Musculoskeletal: no deformities, strength intact in all 4 Skin: moist, warm, no rashes Neurological: no tremor with outstretched hands, DTR normal in all 4  ASSESSMENT: 1. DM2, insulin-dependent, uncontrolled, without complications - gastroparesis - cerebrovasc. Ds -  H/o stroke - PN  She was interested in an insulin pump >> discussed about [employer] (given brochure).  2. Hypothyroidism  3. MNG - Thyroid U/S (10/11/2012): Right thyroid lobe:  3.7 x 1.0 x 1.8 cm Left thyroid lobe:   3.5 x 1.5 x 1.6 cm Isthmus:  0.5 cm Focal nodules:  There is a 0.3 mm calcified nodule in the right midzone.  There is a 0.6 x 0.7 x 0.5 cm hypoechoic nodule in the lateral aspect of the right midzone.  There is a 0.7 x 0.6 x 0.5 cm hyperechoic nodule in the lateral aspect of the right lower pole. There is a 2.2 x 1.1 x 1.7 cm complex solid nodule in the inferior aspect of the isthmus extending adjacent to the left lower pole. There is a 1.4 x 2.1 x 1.4 cm complex solid nodule in the left lower pole.  Lymphadenopathy:  None visualized.   IMPRESSION: Multi nodular goiter which has markedly progressed since the prior exam.  The dominant nodule at the inferior aspect of the isthmus fits criteria for fine needle aspiration  biopsy if not previously Assessed.  - FNA (11/05/2012) x2: benign  - Thyroid U/S (01/20/2014) - felt one nodule enlarging >> new U/S: stable appearance of the nodules, except a small new nodule in isthmus: 1.2 cm  Right thyroid lobe Measurements: 4.4 cm x 1.2 cm x 1.7 cm. Multiple right-sided nodules identified. Each right-sided nodule demonstrates increased echogenicity, with the superior measuring 7 mm -8 mm, most inferior measuring 9 mm - 10 mm. A small, 3 mm focus of calcium with posterior shadowing is evident. There is also a mid nodule measuring 7 mm, which is echogenic. Left thyroid lobe Measurements: 5.1 cm x 2.1 cm x 2.3 cm. Dominant lesion at the inferior pole of left thyroid is again evident, which has been previously biopsied (10/29/2012). This nodule measures 1.8 cm x 1.7cm x 2.5 cm. (Previous 1.4 cm x 2.1 cm x 1.4 cm) Isthmus Thickness: 4 mm-5 mm. Isthmic nodule again noted, previously biopsied (10/29/2012). Currently this nodule measures 2.2 cm x 1.4 cm x 1.8 cm. (previous measurement 2.2 cm x 1.1 cm x 1.7 cm). There is a new nodule identified within the isthmus with heterogeneously hyperechoic characteristics. This nodule measures 12 mm x 5.4 mm x 7.2 mm. Lymphadenopathy: None visualized.  - Thyroid U/S (11/09/2015): Details   Reading Physician Reading Date Result Priority  Sandi Mariscal, MD 11/09/2015   Narrative    COMPARISON:  Thyroid ultrasound - 01/20/2014 ; 10/11/2012  FINDINGS: Parenchymal Echotexture: Moderately heterogenous Estimated total number of nodules > 1 cm: <5 Number of spongiform nodules > 2 cm not described below (TR1): 0 Number of mixed cystic nodules > 1.5 cm not described below ([place]): 0 _____________________________________________________  Isthmus: 0.7 cm  Nodule # 1: Prior biopsy: No Location: Isthmus; left Size: 1.5 x 1.3 x 1.5 cm, previously 2.2 x 1.4 x 1.8 cm Composition: solid/almost completely solid (2) Echogenicity: hyperechoic (1) ACR  TI-RADS total points: 3. Change in features: Yes. Increased internal cystic degeneration and smaller in size. Change in ACR TI-RADS risk category: No  *Given size (1.5 - 2.4 cm) and appearance, a follow-up ultrasound in 1 year is recommended based on TI-RADS criteria as clinically indicated.  _________________________________________________________  Right lobe: 4.6 x 1.1 x 1.9 cm, previously 4.4 x 1.2 x 1.7 cm Stable scattered subcentimeter echogenic nodules _________________________________________________________  Left lobe: 4.8 x 2.1 x 1.9 cm, previously 5.1 x 2.1 x 2.3 cm Nodule # 1: Prior biopsy: No Location: Left; Inferior Size: 2.4 x 1.7 x 1.9 cm, previously 2.5 x 1.7 x 1.7 Composition: solid/almost completely solid (2) Echogenicity: hyperechoic (1) ACR TI-RADS total points: 3. ACR TI-RADS risk category: TR3 (3 points). Change in features: No Change in ACR TI-RADS risk category: No *Given size (1.5 - 2.4  cm) and appearance, a follow-up ultrasound in 1 year is recommended based on TI-RADS criteria as clinically indicated.  IMPRESSION: TR 3 isthmic and left thyroid nodules unchanged. *Given size (1.5 - 2.4 cm) and appearance of both thyroid nodules, a follow-up ultrasound in 1 year is recommended based on TI-RADS criteria as clinically indicated.  The above is in keeping with the ACR TI-RADS recommendations [facility] Radiol 2017;[id number].        10/12/2016: Thyroid U/S: Parenchymal Echotexture: Moderately heterogenous Isthmus: 1.1 cm Right lobe: 3.8 x 1.0 x 1.7 cm Left lobe: 4.5 x 1.7 x 2.1 cm  ______________________________________________________   Estimated total number of nodules >/= 1 cm: 3  _____________________________________________   Diffusely heterogeneous multinodular thyroid gland. As seen previously, there are multiple small echogenic nodules scattered throughout the right gland which do not meet criteria for biopsy or follow-up. Head and  lobular pyramidal lobe extends superiorly from the thyroid isthmus. No interval change.   The previously biopsied nodule in the inferior aspect of the isthmus measures slightly smaller today at 1.5 x 1.0 x 1.4 cm compared to 1.7 x 1.3 x 1.5 cm previously.   The previously biopsied nodule in the left inferior gland is essentially unchanged at 2.6 x 1.3 x 1.5 cm compared to 2.4 x 1.8 x 1.9 cm. No new nodule or abnormality identified.   IMPRESSION: 1. Stable multinodular goiter with a prominent lobular parental lobe extending superiorly from the thyroid isthmus. 2. The previously biopsied nodule in the inferior isthmus measures slightly smaller on today's examination. 3. The previously biopsied nodule in the left inferior gland is stable. 4. Additional scattered small and benign-appearing nodules do not meet criteria for biopsy or continued follow-up. The above is in keeping with the ACR TI-RADS recommendations - J Am Coll Radiol 2017;14:587-595.  Thyroid U/S (02/02/2021): Parenchymal Echotexture: Mildly heterogeneous Isthmus: 0.6 cm Right lobe: 4.4 x 0.8 x 1.7 cm  Left lobe: 4.5 x 2.1 x 3.6 cm  ______________________________________________________   Nodule # 1:  Prior biopsy: Yes-10/29/2012  Location: Isthmus; superior  Maximum size: 2.6 cm; Other 2 dimensions: 1.0 x 2.0 cm, previously, 1.5 x 1.0 x 1.4 cm  Composition: mixed cystic and solid (1)  Echogenicity: isoechoic (1) Significant change in size (>/= 20% in two dimensions and minimal increase of 2 mm): Yes (predominately due to increase in cystic component) Change in features: Yes Change in ACR TI-RADS risk category: Yes This nodule does NOT meet TI-RADS criteria for biopsy or dedicated follow-up.  _________________________________________________________   Nodule 2: 1.0 x 0.8 x 0.8 cm hypoechoic (actually hyperechoic) nodule in the inferior right thyroid lobe does (NOT) meet criteria for FNA or imaging  surveillance. _________________________________________________________   There is diffuse heterogeneity of the left thyroid lobe without discrete nodules.   IMPRESSION: Previously biopsied isthmus nodule demonstrates interval development of cystic component. It does not meet criteria for repeat biopsy at this time. No new suspicious thyroid nodules.   The above is in keeping with the ACR TI-RADS recommendations - J Am Coll Radiol 2017;14:587-595.   Electronically Signed   By: Miachel Roux M.D.   On: 02/03/2021 08:17   This did not show appears to have increased in size, but this is actually due to the increased cystic component.  This nodule has been previously biopsied with benign results. Called and discussed with Dr.Mir with [place] and he acknowledged that the changes in red above have been dictation errors and he addended the report.  PLAN:  1. Patient with longstanding, uncontrolled, type 2 diabetes, on basal/bolus insulin regimen, with fluctuating CBGs but higher blood sugars whenever she gets steroid injections.  Latest HbA1c was 5.9% at last visit.  Since then, she called [name] with high blood sugars in the 400s in 03/2021 during the time when she had COVID-19.  At that time, we increase Toujeo. -At last visit, I did recommend a CGM.  She was not able to obtain this yet, but she did contact a company and they will get in touch with me to approve it. -At today's visit, sugars are at or close to goal when she is not on steroids, without significant lows, although she did have a low blood sugar, and 51 on her labs from 05/10/2021.  Whenever she is taking steroids, her sugars increased to 300s and 400s.  We discussed that in the situation she needs more insulin.  For now, I advised her to continue the same insulin doses until she gets a steroid injection.  I am hoping that she will be able to avoid them in the future. -At today's visit, she inquires about an insulin pump.  I do not  feel she is a good candidate for the pump due to the complexities of maneuvering the device. - I advised her to:  Patient Instructions  Please continue: - Toujeo 24 units daily (30 units after steroid injections) - Novolog  14 units for smaller meals 16 units for large meals (20-24 units after steroid injections)  Please continue levothyroxine 50 mcg daily.  Take the thyroid hormone every day, with water, at least 30 minutes before breakfast, separated by at least 4 hours from: - acid reflux medications - calcium - iron - multivitamins  PLEASE  MOVE ACIPHEX AT LEAST 4 HOURS AFTER LEVOTHYROXINE.  Please stop at the lab.  Please return in 4 months.  - we checked her HbA1c: 5.9% (stable) - advised to check sugars at different times of the day - 4x a day, rotating check times - advised for yearly eye exams >> she is UTD - return to clinic in 4 months   2. Hypothyroidism - latest thyroid labs reviewed with pt. >> normal: Lab Results  Component Value Date   TSH 0.631 12/11/2019  - she continues on LT4 50 mcg daily - pt feels good on this dose. - we discussed about taking the thyroid hormone every day, with water, >30 minutes before breakfast, separated by >4 hours from acid reflux medications, calcium, iron, multivitamins. Pt. is taking it correctly. - at last visit we could not check her thyroid tests as she was taking Aciphex along with levothyroxine.  I advised her to move the Aciphex at least 4 hours after the thyroid hormone, however, she tells me that they are still in the same place in her pillbox and she did not separate them.  I again wrote down for her to try to separate them by 4 hours.  I asked her to show these to the home health nurse. - will check thyroid tests today: TSH and fT4 - If labs are abnormal, she will need to return for repeat TFTs in 1.5 months  3. MNG -She has a history of 2 benign thyroid nodule biopsied in 2014 -Reviewing the reports of her thyroid  ultrasound from 2014-2022, these nodules have remained stable -She does have chronic mild dysphagia.  No esophageal compression per barium swallow study from 08/2014.  She did not have esophageal dysmotility and had multiple esophageal dilations -At  last visit, she mentioned that she was worried about her nodules and still had dysphagia >> we checked another ultrasound.  Results reviewed today.  One of the nodules appeared larger but this was due to an increase in the cystic component.  Since nodules are stable, no further intervention is needed.  Component     Latest Ref Rng & Units 05/19/2021  TSH     0.35 - 5.50 uIU/mL 0.85  T4,Free(Direct)     0.60 - 1.60 ng/dL 0.81  Normal TFTs.  Philemon Kingdom, MD PhD [facility] Endocrinology

## 2021-05-19 NOTE — Telephone Encounter (Signed)
Pt has been referred to [facility] ?

## 2021-05-19 NOTE — Telephone Encounter (Signed)
Pt aware- referred to thn for assistance with her meds to make sure they are being organized by her aide correctly  ?

## 2021-05-20 ENCOUNTER — Ambulatory Visit (INDEPENDENT_AMBULATORY_CARE_PROVIDER_SITE_OTHER): Payer: Medicare HMO | Admitting: Podiatry

## 2021-05-20 ENCOUNTER — Telehealth: Payer: Self-pay | Admitting: *Deleted

## 2021-05-20 ENCOUNTER — Other Ambulatory Visit: Payer: Self-pay | Admitting: Family Medicine

## 2021-05-20 ENCOUNTER — Other Ambulatory Visit: Payer: Self-pay | Admitting: Internal Medicine

## 2021-05-20 ENCOUNTER — Encounter: Payer: Self-pay | Admitting: Podiatry

## 2021-05-20 DIAGNOSIS — M79676 Pain in unspecified toe(s): Secondary | ICD-10-CM

## 2021-05-20 DIAGNOSIS — B351 Tinea unguium: Secondary | ICD-10-CM | POA: Diagnosis not present

## 2021-05-20 DIAGNOSIS — E1142 Type 2 diabetes mellitus with diabetic polyneuropathy: Secondary | ICD-10-CM | POA: Diagnosis not present

## 2021-05-20 DIAGNOSIS — Q828 Other specified congenital malformations of skin: Secondary | ICD-10-CM

## 2021-05-20 LAB — POCT GLYCOSYLATED HEMOGLOBIN (HGB A1C): Hemoglobin A1C: 5.9 % — AB (ref 4.0–5.6)

## 2021-05-20 LAB — TSH: TSH: 0.85 u[IU]/mL (ref 0.35–5.50)

## 2021-05-20 LAB — T4, FREE: Free T4: 0.81 ng/dL (ref 0.60–1.60)

## 2021-05-20 NOTE — Chronic Care Management (AMB) (Signed)
?  Care Management  ? ?Note ? ?05/20/2021 ?Name: DANIELYS MADRY MRN: [id number] DOB: 06-18-1950 ? ?Santiago Graf Kulick is a 71 y.o. year old female who is a primary care patient of [name], MD and is actively engaged with the care management team. I reached out to Rebbeca Paul Ono by phone today to assist with re-scheduling a follow up visit with the RN Case Manager ? ?Follow up plan: ?Unsuccessful telephone outreach attempt made. A HIPAA compliant phone message was left for the patient providing contact information and requesting a return call.  ?The care management team will reach out to the patient again over the next 7 days.  ?If patient returns call to provider office, please advise to call [place] at [phone]. ? ?Laverda Sorenson  ?Care Guide, Embedded Care Coordination ?[place]  Care Management  ?Direct Dial: [phone] ? ?

## 2021-05-23 ENCOUNTER — Inpatient Hospital Stay: Admission: RE | Admit: 2021-05-23 | Payer: Medicare HMO | Source: Ambulatory Visit

## 2021-05-23 DIAGNOSIS — F17210 Nicotine dependence, cigarettes, uncomplicated: Secondary | ICD-10-CM | POA: Diagnosis not present

## 2021-05-23 DIAGNOSIS — R2681 Unsteadiness on feet: Secondary | ICD-10-CM | POA: Diagnosis not present

## 2021-05-23 DIAGNOSIS — D649 Anemia, unspecified: Secondary | ICD-10-CM | POA: Diagnosis not present

## 2021-05-23 DIAGNOSIS — M160 Bilateral primary osteoarthritis of hip: Secondary | ICD-10-CM | POA: Diagnosis not present

## 2021-05-23 DIAGNOSIS — F250 Schizoaffective disorder, bipolar type: Secondary | ICD-10-CM | POA: Diagnosis not present

## 2021-05-23 DIAGNOSIS — E1142 Type 2 diabetes mellitus with diabetic polyneuropathy: Secondary | ICD-10-CM | POA: Diagnosis not present

## 2021-05-23 DIAGNOSIS — J449 Chronic obstructive pulmonary disease, unspecified: Secondary | ICD-10-CM | POA: Diagnosis not present

## 2021-05-23 DIAGNOSIS — J42 Unspecified chronic bronchitis: Secondary | ICD-10-CM | POA: Diagnosis not present

## 2021-05-23 DIAGNOSIS — E039 Hypothyroidism, unspecified: Secondary | ICD-10-CM | POA: Diagnosis not present

## 2021-05-23 NOTE — Chronic Care Management (AMB) (Signed)
?  Care Management  ? ?Note ? ?05/23/2021 ?Name: MELANIE OPENSHAW MRN: [id number] DOB: 1950-05-23 ? ?Aislynn Cifelli Bergstresser is a 71 y.o. year old female who is a primary care patient of [name], MD and is actively engaged with the care management team. I reached out to Rebbeca Paul Wagenaar by phone today to assist with re-scheduling a follow up visit with the RN Case Manager and Pharmacist ? ?Follow up plan: ?Unsuccessful telephone outreach attempt made. A HIPAA compliant phone message was left for the patient providing contact information and requesting a return call.  ?The care management team will reach out to the patient again over the next 7 days.  ?If patient returns call to provider office, please advise to call [place]  at [phone]. ? ?Laverda Sorenson  ?Care Guide, Embedded Care Coordination ?[place]  Care Management  ?Direct Dial: [phone] ? ?

## 2021-05-24 ENCOUNTER — Other Ambulatory Visit: Payer: Self-pay | Admitting: Internal Medicine

## 2021-05-24 ENCOUNTER — Telehealth: Payer: Self-pay | Admitting: *Deleted

## 2021-05-24 MED ORDER — LORAZEPAM 1 MG PO TABS: 1.0000 mg | ORAL_TABLET | Freq: Once | ORAL | 0 refills | Status: DC | PRN

## 2021-05-24 NOTE — Telephone Encounter (Signed)
Patient called about her MRI that is scheduled for tomorrow.  She states that she got her MRI date mixed up and took her Ativan.  She also took the entire bottle Ativan 3 mg at one time.  She is asking for refill for the MRI tomorrow. ? ?Routing to MD to see if he will order dose.   ?

## 2021-05-25 ENCOUNTER — Other Ambulatory Visit: Payer: Self-pay

## 2021-05-25 ENCOUNTER — Ambulatory Visit
Admission: RE | Admit: 2021-05-25 | Discharge: 2021-05-25 | Disposition: A | Discharge status: Home / Self Care | Payer: Medicare HMO | Source: Ambulatory Visit | Attending: Internal Medicine | Admitting: Internal Medicine

## 2021-05-25 DIAGNOSIS — S06340A Traumatic hemorrhage of right cerebrum without loss of consciousness, initial encounter: Secondary | ICD-10-CM | POA: Diagnosis not present

## 2021-05-25 DIAGNOSIS — C719 Malignant neoplasm of brain, unspecified: Secondary | ICD-10-CM | POA: Diagnosis not present

## 2021-05-25 DIAGNOSIS — D329 Benign neoplasm of meninges, unspecified: Secondary | ICD-10-CM | POA: Diagnosis not present

## 2021-05-25 DIAGNOSIS — D496 Neoplasm of unspecified behavior of brain: Secondary | ICD-10-CM | POA: Diagnosis not present

## 2021-05-25 MED ORDER — GADOBENATE DIMEGLUMINE 529 MG/ML IV SOLN
17.0000 mL | Freq: Once | INTRAVENOUS | Status: AC | PRN
  Administered 2021-05-25: 17 mL via INTRAVENOUS

## 2021-05-25 NOTE — Chronic Care Management (AMB) (Signed)
?  Care Management  ? ?Note ? ?05/25/2021 ?Name: [name] MRN: [id number] DOB: 03/30/1950 ? ?[name] is a 71 y.o. year old female who is a primary care patient of [name] Helper, MD and is actively engaged with the care management team. I reached out to [name] by phone today to assist with scheduling a follow up visit with the Pharmacist ? ?Follow up plan: ?Telephone appointment with care management team member scheduled for:06/02/21 for in person ? ?[name]  ?Care Guide, Embedded Care Coordination ?[place]  Care Management  ?Direct Dial: [phone] ? ?

## 2021-05-26 ENCOUNTER — Other Ambulatory Visit: Payer: Self-pay

## 2021-05-26 ENCOUNTER — Inpatient Hospital Stay: Payer: Medicare HMO | Attending: Internal Medicine | Admitting: Internal Medicine

## 2021-05-26 ENCOUNTER — Other Ambulatory Visit: Payer: Medicare HMO

## 2021-05-26 VITALS — BP 139/61 | HR 70 | Temp 98.2°F | Resp 19 | Ht 59.0 in | Wt 170.1 lb

## 2021-05-26 DIAGNOSIS — Z86010 Personal history of colonic polyps: Secondary | ICD-10-CM | POA: Diagnosis not present

## 2021-05-26 DIAGNOSIS — Z79899 Other long term (current) drug therapy: Secondary | ICD-10-CM | POA: Diagnosis not present

## 2021-05-26 DIAGNOSIS — K219 Gastro-esophageal reflux disease without esophagitis: Secondary | ICD-10-CM | POA: Insufficient documentation

## 2021-05-26 DIAGNOSIS — I5032 Chronic diastolic (congestive) heart failure: Secondary | ICD-10-CM | POA: Diagnosis not present

## 2021-05-26 DIAGNOSIS — E785 Hyperlipidemia, unspecified: Secondary | ICD-10-CM | POA: Diagnosis not present

## 2021-05-26 DIAGNOSIS — M810 Age-related osteoporosis without current pathological fracture: Secondary | ICD-10-CM | POA: Diagnosis not present

## 2021-05-26 DIAGNOSIS — I129 Hypertensive chronic kidney disease with stage 1 through stage 4 chronic kidney disease, or unspecified chronic kidney disease: Secondary | ICD-10-CM | POA: Diagnosis not present

## 2021-05-26 DIAGNOSIS — G40909 Epilepsy, unspecified, not intractable, without status epilepticus: Secondary | ICD-10-CM

## 2021-05-26 DIAGNOSIS — D320 Benign neoplasm of cerebral meninges: Secondary | ICD-10-CM | POA: Insufficient documentation

## 2021-05-26 DIAGNOSIS — E119 Type 2 diabetes mellitus without complications: Secondary | ICD-10-CM | POA: Diagnosis not present

## 2021-05-26 DIAGNOSIS — Z803 Family history of malignant neoplasm of breast: Secondary | ICD-10-CM | POA: Diagnosis not present

## 2021-05-26 DIAGNOSIS — E8721 Acute metabolic acidosis: Secondary | ICD-10-CM | POA: Diagnosis not present

## 2021-05-26 DIAGNOSIS — E039 Hypothyroidism, unspecified: Secondary | ICD-10-CM | POA: Insufficient documentation

## 2021-05-26 DIAGNOSIS — Z800 Family history of malignant neoplasm of digestive organs: Secondary | ICD-10-CM | POA: Insufficient documentation

## 2021-05-26 DIAGNOSIS — D329 Benign neoplasm of meninges, unspecified: Secondary | ICD-10-CM

## 2021-05-26 DIAGNOSIS — K449 Diaphragmatic hernia without obstruction or gangrene: Secondary | ICD-10-CM | POA: Diagnosis not present

## 2021-05-26 DIAGNOSIS — N189 Chronic kidney disease, unspecified: Secondary | ICD-10-CM | POA: Diagnosis not present

## 2021-05-26 DIAGNOSIS — Z7982 Long term (current) use of aspirin: Secondary | ICD-10-CM | POA: Insufficient documentation

## 2021-05-26 DIAGNOSIS — I10 Essential (primary) hypertension: Secondary | ICD-10-CM | POA: Insufficient documentation

## 2021-05-26 DIAGNOSIS — R768 Other specified abnormal immunological findings in serum: Secondary | ICD-10-CM | POA: Diagnosis not present

## 2021-05-26 DIAGNOSIS — D638 Anemia in other chronic diseases classified elsewhere: Secondary | ICD-10-CM | POA: Diagnosis not present

## 2021-05-26 DIAGNOSIS — Z6834 Body mass index (BMI) 34.0-34.9, adult: Secondary | ICD-10-CM | POA: Diagnosis not present

## 2021-05-26 DIAGNOSIS — F17210 Nicotine dependence, cigarettes, uncomplicated: Secondary | ICD-10-CM | POA: Diagnosis not present

## 2021-05-26 DIAGNOSIS — E1122 Type 2 diabetes mellitus with diabetic chronic kidney disease: Secondary | ICD-10-CM | POA: Diagnosis not present

## 2021-05-26 DIAGNOSIS — G4730 Sleep apnea, unspecified: Secondary | ICD-10-CM | POA: Diagnosis not present

## 2021-05-26 DIAGNOSIS — J449 Chronic obstructive pulmonary disease, unspecified: Secondary | ICD-10-CM | POA: Diagnosis not present

## 2021-05-26 DIAGNOSIS — Z8673 Personal history of transient ischemic attack (TIA), and cerebral infarction without residual deficits: Secondary | ICD-10-CM | POA: Insufficient documentation

## 2021-05-26 NOTE — Progress Notes (Signed)
? ?[facility] [address]  ?[place], [place] [address] ?[phone] ? ? ?Interval Evaluation ? ?Date of Service: 05/26/21 ?Patient Name: [name] ?Patient MRN: [id number] ?Patient DOB: 10-09-1950 ?Provider: Ventura Sellers, MD ? ?Identifying Statement:  ?[name] is a 71 y.o. female with right frontal meningioma  ? ?Oncologic History: ?11/23/11: Craniotomy, resection orbital groove meningioma Sherwood Gambler) ?10/17/18: SRS for localized recurrent disease Lisbeth Renshaw) ? ?Interval History: ?[name] presents today for follow up after recent MRI brain.  She may have had a recent nocturnal seizure, woke up with tongue bitten and feeling confused.  Otherwise no events, maintains with Lamictal.  Continues to suffer from chronic back and leg pain, visit with pain management.  Lives alone, still uses a walker.  Has an aide to help during the week. ? ?H+P (04/27/20) Patient presents today for follow up after recent MRI brain.  She describes no new or progressive neurologic deficits.  No focal weakness, language dysfunction or recent seizures.  She continues to dose Lamictal through Dr. Jannifer Franklin.  She continues to describe chronic limb and joint pain requiring frequent use of opiate rescue, managed by pain clinic.  Multiple recent ED visits.  Continues to follow with behavioral health, and works with physical therapy as well.  Ambulates with a walker because of orthopedic dysfunction, chronic pain.  ? ?Medications: ?Current Outpatient Medications on File Prior to Visit  ?Medication Sig Dispense Refill  ? ACCU-CHEK GUIDE test strip USE TO CHECK BLOOD SUGAR FOUR TIMES A DAY AND AS NEEDED (NEED MD APPOINTMENT FOR ANY FURTHER REFILLS) 400 strip 2  ? Accu-Chek Softclix Lancets lancets TEST BLOOD SUGAR THREE TIMES DAILY AS DIRECTED 300 each 2  ? albuterol (VENTOLIN HFA) 108 (90 Base) MCG/ACT inhaler INHALE 1 TO 2 PUFFS EVERY 6 HOURS AS NEEDED FOR WHEEZING, SHORTNESS OF BREATH 3 each 1  ?  Alcohol Swabs (DROPSAFE ALCOHOL PREP) 70 % PADS USE TO CLEAN FINGER PRIOR TO TESTING FOR BLOOD SUGAR  AS DIRECTED 300 each 1  ? alendronate (FOSAMAX) 70 MG tablet TAKE 1 TABLET (70 MG TOTAL) BY MOUTH EVERY 7 (SEVEN) DAYS. TAKE WITH A FULL GLASS OF WATER ON AN EMPTY STOMACH. 12 tablet 3  ? amLODipine (NORVASC) 10 MG tablet TAKE 1 TABLET EVERY DAY (Patient taking differently: Take 10 mg by mouth at bedtime.) 90 tablet 1  ? ascorbic acid (VITAMIN C) 500 MG tablet Take 500 mg by mouth daily.    ? aspirin EC 81 MG tablet Take 1 tablet (81 mg total) by mouth daily with breakfast. 120 tablet 2  ? benzonatate (TESSALON) 100 MG capsule Take 1 capsule (100 mg total) by mouth 2 (two) times daily as needed for cough. 20 capsule 0  ? betamethasone dipropionate 0.05 % cream betamethasone dipropionate 0.05 % topical cream    ? Blood Glucose Calibration (ACCU-CHEK GUIDE CONTROL) LIQD USE AS DIRECTED 1 each 0  ? blood glucose meter kit and supplies Dispense based on patient and insurance preference. Use up to four times daily as directed. (FOR ICD-10 E10.9, E11.9). 1 each 0  ? buPROPion (WELLBUTRIN XL) 150 MG 24 hr tablet Take 1 tablet (150 mg total) by mouth every morning. 90 tablet 2  ? cetirizine (ZYRTEC) 10 MG tablet Take 1 tablet (10 mg total) by mouth daily. 30 tablet 1  ? Clobetasol Prop Emollient Base (CLOBETASOL PROPIONATE E) 0.05 % emollient cream Apply to affected area qd 60 g 6  ? Continuous  Blood Gluc Sensor (FREESTYLE LIBRE 14 DAY SENSOR) MISC 1 each by Does not apply route every 14 (fourteen) days. Change every 2 weeks 2 each 11  ? diclofenac Sodium (VOLTAREN) 1 % GEL APPLY 2 GRAMS  TOPICALLY FOUR TIMES DAILY. (Patient taking differently: Apply 2 g topically 4 (four) times daily.) 700 g 1  ? dicyclomine (BENTYL) 10 MG capsule Take by mouth.    ? Docusate Sodium (DSS) 100 MG CAPS Take by mouth.    ? DROPLET PEN NEEDLES 31G X 8 MM MISC USE FOR INJECTING INSULIN 4 TIMES DAILY. 400 each 0  ? ezetimibe (ZETIA) 10 MG tablet  TAKE 1 TABLET EVERY DAY (Patient taking differently: Take 10 mg by mouth daily.) 90 tablet 1  ? famotidine (PEPCID) 40 MG tablet Take 40 mg by mouth 2 (two) times daily.    ? guaiFENesin (MUCINEX) 600 MG 12 hr tablet Take 1 tablet (600 mg total) by mouth 2 (two) times daily. 20 tablet 0  ? hydroxychloroquine (PLAQUENIL) 200 MG tablet Take by mouth.    ? hydrOXYzine (ATARAX/VISTARIL) 10 MG tablet Take 1 tablet (10 mg total) by mouth 3 (three) times daily as needed. (Patient taking differently: Take 10 mg by mouth 3 (three) times daily as needed for itching or anxiety.) 30 tablet 0  ? insulin glargine, 2 Unit Dial, (TOUJEO MAX SOLOSTAR) 300 UNIT/ML Solostar Pen Inject 24 Units into the skin at bedtime. (Patient taking differently: Inject 32 Units into the skin at bedtime.) 9 mL 1  ? lamoTRIgine (LAMICTAL) 100 MG tablet Take 1 tablet (100 mg total) by mouth 2 (two) times daily. 180 tablet 2  ? levothyroxine (SYNTHROID) 50 MCG tablet Take 1 tablet (50 mcg) 6 days 1/2 tablet (25 mcg) 1 day weekly. 80 tablet 3  ? LORazepam (ATIVAN) 0.5 MG tablet Take 1 tablet (0.5 mg total) by mouth 2 (two) times daily. 60 tablet 2  ? LORazepam (ATIVAN) 1 MG tablet Take 1 tablet (1 mg total) by mouth once as needed for up to 1 dose for anxiety (MRI claustrophobia). 2 tablet 0  ? losartan (COZAAR) 100 MG tablet Take 1 tablet (100 mg total) by mouth daily. (Patient taking differently: Take 100 mg by mouth at bedtime.) 30 tablet 2  ? losartan (COZAAR) 50 MG tablet TAKE 1 TABLET EVERY DAY 90 tablet 0  ? meclizine (ANTIVERT) 25 MG tablet Take 1 tablet (25 mg total) by mouth 3 (three) times daily as needed for dizziness. 30 tablet 0  ? methocarbamol (ROBAXIN) 500 MG tablet Take one tablet by mouth two times daily , as needed, for neck and back pain and spasm (Patient taking differently: Take 500 mg by mouth 2 (two) times daily as needed for muscle spasms (for neck and back pain and spasm).) 20 tablet 0  ? metoprolol tartrate (LOPRESSOR) 50 MG  tablet TAKE 1 TABLET TWICE DAILY (NEED MD APPOINTMENT) (Patient taking differently: Take 50 mg by mouth 2 (two) times daily.) 180 tablet 3  ? mirabegron ER (MYRBETRIQ) 25 MG TB24 tablet Take 1 tablet (25 mg total) by mouth daily. 90 tablet 1  ? Misc. Devices (MATTRESS PAD) MISC FIRM MATTRESS PAD for hospital bed x 1 1 each 0  ? montelukast (SINGULAIR) 10 MG tablet TAKE 1 TABLET EVERY DAY 90 tablet 1  ? NOVOLOG FLEXPEN 100 UNIT/ML FlexPen INJECT 16 TO 18 UNITS UNDER SKIN UP TO 3 TIMES A DAY BEFORE MEALS  (DISCARD PEN 28 DAYS AFTER OPENING) 60 mL 0  ? nystatin (  MYCOSTATIN/NYSTOP) powder APPLY TO AFFECTED AREA 4 TIMES DAILY. 90 g 1  ? Omega-3 Fatty Acids (FISH OIL PO) Take 1 capsule by mouth daily.    ? oxyCODONE-acetaminophen (PERCOCET) 10-325 MG tablet Take 1 tablet by mouth every 8 (eight) hours as needed for pain.    ? Plecanatide (TRULANCE) 3 MG TABS Take 3 mg by mouth daily as needed. 30 tablet 0  ? pregabalin (LYRICA) 75 MG capsule Take 1 capsule (75 mg total) by mouth daily.    ? RABEprazole (ACIPHEX) 20 MG tablet TAKE 1 TABLET TWICE DAILY 180 tablet 0  ? RESTASIS 0.05 % ophthalmic emulsion Place 2 drops into both eyes daily.    ? risperiDONE (RISPERDAL) 0.5 MG tablet Take 1 tablet (0.5 mg total) by mouth at bedtime. 90 tablet 2  ? rosuvastatin (CRESTOR) 5 MG tablet TAKE 1 TABLET AT BEDTIME 90 tablet 2  ? sertraline (ZOLOFT) 100 MG tablet Take 1 tablet (100 mg total) by mouth daily. 90 tablet 2  ? spironolactone (ALDACTONE) 25 MG tablet Take 1 tablet (25 mg total) by mouth daily. 30 tablet 2  ? STIOLTO RESPIMAT 2.5-2.5 MCG/ACT AERS INHALE 2 PUFFS INTO THE LUNGS DAILY. (Patient taking differently: Inhale 2 puffs into the lungs daily.) 12 g 1  ? traZODone (DESYREL) 150 MG tablet Take 1 tablet (150 mg total) by mouth at bedtime. 90 tablet 2  ? [facility] bed mattress x 1  ?DX: G47.33, J44.9 1 each 0  ? UNABLE TO FIND Standard wheelchair ?Dx M47.16, R29.898 1 each 0  ? UNABLE TO FIND Med Name: Activice  cooling gel spray on arms, legs, and back    ? [facility] bed with orthopedic mattress x 1 ?DX G47.33, G89.4 1 each 0  ? ?No current facility-administered medications on file prior to visit.

## 2021-05-27 ENCOUNTER — Telehealth: Payer: Self-pay

## 2021-05-27 ENCOUNTER — Telehealth: Payer: Self-pay | Admitting: Internal Medicine

## 2021-05-27 NOTE — Telephone Encounter (Signed)
Patient reports history of bipolar disorder and therefore does not meet inclusion criteria for telephone-based smoking cessation services.  ? ?Joseph Art, Pharm.D. ?PGY-1 Pharmacy Resident ?05/27/2021 1:36 PM ?

## 2021-05-27 NOTE — Telephone Encounter (Signed)
? ?  [name] is a 71 y.o. female and has been referred to the pharmacist telephone-based smoking cessation service on 05/17/21 by pulmonologist [name] ? ?------------------------------------------------------------------------------------------------------------------- ? ?Confirmed that patient does not meet any of the following exclusion criteria: No  ?Pregnancy ?Schizophrenia, bipolar disorder, or major depression ?Myocardial infarction or coronary artery bypass grafting in the last 2 months ?Severe or worsening angina ? ?------------------------------------------------------------------------------------------------------------------- ? ?Patient has a history of bipolar disorder and therefore does not qualify for the smoking cessation program. Patient states she has used 1-800 QUIT NOW services before but fell out of care. Encouraged patient to call them after our phone call to see what services they can offer her.  ? ?Joseph Art, Pharm.D. ?PGY-1 Pharmacy Resident ?05/27/2021 1:32 PM ? ?

## 2021-05-27 NOTE — Telephone Encounter (Signed)
Scheduled per 3/16 los, mailed calender for appt in 42yr?

## 2021-05-29 ENCOUNTER — Other Ambulatory Visit: Payer: Self-pay | Admitting: Internal Medicine

## 2021-05-29 NOTE — Progress Notes (Signed)
?Subjective:  ?Patient ID: [name], female    DOB: [birth year],  MRN: [id number] ? ?[name] presents to clinic today for at risk foot care with history of diabetic neuropathy and painful elongated mycotic toenails 1-5 bilaterally which are tender when wearing enclosed shoe gear. Pain is relieved with periodic professional debridement. ? ?Patient states blood glucose was 143 mg/dl today.  Last HgA1c was 5.9%. ? ?New problem(s):  Painful lesion on plantar aspect of right foot ? ?PCP is [name], MD , and last visit was May 10, 2021. ? ?Allergies  ?Allergen Reactions  ? Iron Nausea And Vomiting  ?  And itching ?And itching  ? Lac Bovis Rash  ?  Doesn't agree with stomach.  ?Doesn't agree with stomach.  ?Doesn't agree with stomach.   ? Penicillins Hives  ?  Has patient had a PCN reaction causing immediate rash, facial/tongue/throat swelling, SOB or lightheadedness with hypotension: Yes ?Has patient had a PCN reaction causing severe rash involving mucus membranes or skin necrosis: No ?Has patient had a PCN reaction that required hospitalization No ?Has patient had a PCN reaction occurring within the last 10 years: No ?If all of the above answers are "NO", then may proceed with Cephalosporin use. ? ?Other reaction(s): Other (see comments) ?Has patient had a PCN reaction causing immediate rash, facial/tongue/throat swelling, SOB or lightheadedness with hypotension: Yes ?Has patient had a PCN reaction causing severe rash involving mucus membranes or skin necrosis: No ?Has patient had a PCN reaction that required hospitalization No ?Has patient had a PCN reaction occurring within the last 10 years: No ?If all of the above answers are "NO", then may proceed with Cephalosporin use. ?Has patient had a PCN reaction causing immediate rash, facial/tongue/throat swelling, SOB or lightheadedness with hypotension: Yes ?Has patient had a PCN reaction causing severe rash involving mucus membranes or skin  necrosis: No ?Has patient had a PCN reaction that required hospitalization No ?Has patient had a PCN reaction occurring within the last 10 years: No ?If all of the above answers are "NO", then may proceed with Cephalosporin use. ?Has patient had a PCN reaction causing immediate rash, facial/tongue/throat swelling, SOB or lightheadedness with hypotension: Yes ?Has patient had a PCN reaction causing severe rash involving mucus membranes or skin necrosis: No ?Has patient had a PCN reaction that required hospitalization No ?Has patient had a PCN reaction occurring within the last 10 years: No ?If all of the above answers are "NO", then may proceed with Cephalosporin use. ?Has patient had a PCN reaction causing immediate rash, facial/tongue/throat swelling, SOB or lightheadedness with hypotension: Yes ?Has patient had a PCN reaction causing sever... (TRUNCATED)  ? Phenazopyridine Hives  ? Phenazopyridine Hcl Hives  ? Cephalexin Hives  ? Flonase [Fluticasone]   ?  "It gave me ulcers in my nose"  ? Milk-Related Compounds Other (See Comments)  ?  Doesn't agree with stomach.   ? Phenazopyridine Hcl Hives  ?      ? ? ?Review of Systems: Negative except as noted in the HPI. ? ?Objective: No changes noted in today's physical examination. ?General: Patient is a pleasant 71 y.o. African American female in NAD. AAO x 3.  ? ?Neurovascular Examination: ?Capillary refill time to digits immediate b/l. Faintly palpable pedal pulses b/l LE. Pedal hair sparse b/l.  Lower extremity skin temperature gradient within normal limits. No edema noted b/l lower extremities.  ? ?Protective sensation intact diminished bilaterally with 10g monofilament b/l. ? ?Dermatological:  ?Skin  warm and supple b/l lower extremities. No open wounds b/l lower extremities. No interdigital macerations b/l lower extremities. Toenails 1-5 b/l elongated, discolored, dystrophic, thickened, crumbly with subungual debris and tenderness to dorsal palpation. Porokeratotic  lesion plantarlateral midfoot of RLE with tenderness to palpation. No erythema, no edema, no drainage, no flocculence.  ? ?Musculoskeletal:  ?Normal muscle strength 5/5 to all lower extremity muscle groups bilaterally. No pain crepitus or joint limitation noted with ROM b/l lower extremities. HAV with bunion deformity b/l. Uses walker for gait assistance. ? ?Hemoglobin A1C Latest Ref Rng & Units 05/20/2021 01/17/2021 07/29/2020  ?HGBA1C 4.0 - 5.6 % 5.9(A) 5.9(A) 5.8(A)  ?Some recent data might be hidden  ? ?Assessment/Plan: ?1. Pain due to onychomycosis of toenail   ?2. Porokeratosis   ?3. Diabetic polyneuropathy associated with type 2 diabetes mellitus ([place])   ?  ?-Examined patient. ?-Mycotic toenails 1-5 bilaterally were debrided in length and girth with sterile nail nippers and dremel without incident. ?-Painful porokeratotic lesion(s) right plantar midfoot pared and enucleated with sterile scalpel blade without incident. Total number of lesions debrided=1. ?-Patient/POA to call should there be question/concern in the interim.  ? ?Return in about 3 months (around 08/20/2021). ? ?Marzetta Board, DPM  ?

## 2021-05-30 ENCOUNTER — Inpatient Hospital Stay: Payer: Medicare HMO

## 2021-05-30 ENCOUNTER — Ambulatory Visit (INDEPENDENT_AMBULATORY_CARE_PROVIDER_SITE_OTHER): Payer: Medicare HMO | Admitting: Psychiatry

## 2021-05-30 ENCOUNTER — Other Ambulatory Visit: Payer: Self-pay

## 2021-05-30 DIAGNOSIS — M160 Bilateral primary osteoarthritis of hip: Secondary | ICD-10-CM | POA: Diagnosis not present

## 2021-05-30 DIAGNOSIS — F331 Major depressive disorder, recurrent, moderate: Secondary | ICD-10-CM

## 2021-05-30 DIAGNOSIS — E1142 Type 2 diabetes mellitus with diabetic polyneuropathy: Secondary | ICD-10-CM | POA: Diagnosis not present

## 2021-05-30 DIAGNOSIS — E039 Hypothyroidism, unspecified: Secondary | ICD-10-CM | POA: Diagnosis not present

## 2021-05-30 DIAGNOSIS — F250 Schizoaffective disorder, bipolar type: Secondary | ICD-10-CM | POA: Diagnosis not present

## 2021-05-30 DIAGNOSIS — J449 Chronic obstructive pulmonary disease, unspecified: Secondary | ICD-10-CM | POA: Diagnosis not present

## 2021-05-30 DIAGNOSIS — J42 Unspecified chronic bronchitis: Secondary | ICD-10-CM | POA: Diagnosis not present

## 2021-05-30 DIAGNOSIS — R2681 Unsteadiness on feet: Secondary | ICD-10-CM | POA: Diagnosis not present

## 2021-05-30 DIAGNOSIS — D649 Anemia, unspecified: Secondary | ICD-10-CM | POA: Diagnosis not present

## 2021-05-30 DIAGNOSIS — F17210 Nicotine dependence, cigarettes, uncomplicated: Secondary | ICD-10-CM | POA: Diagnosis not present

## 2021-05-30 NOTE — Progress Notes (Signed)
Virtual Visit via Telephone Note ? ?I connected with [name] on 05/30/21 at 4:17 PM EDT  by telephone and verified that I am speaking with the correct person using two identifiers. ? ?Location: ?Patient: Home ?Provider: [facility] [place] office  ?  ?I discussed the limitations, risks, security and privacy concerns of performing an evaluation and management service by telephone and the availability of in person appointments. I also discussed with the patient that there may be a patient responsible charge related to this service. The patient expressed understanding and agreed to proceed. ? ? ? ?I provided 50  minutes of non-face-to-face time during this encounter. ? ? ?[name] E [name] Pondexter, LCSW ? ? ? ? ?   ?        ?THERAPIST PROGRESS NOTE   ? ?Session Time: Monday 05/30/2021 4:17 PM - 4:07 PM  ? ?Participation Level: Active ? ?Behavioral Response: depressed, talkative, alert  ? ?Type of Therapy: Individual Therapy ? ?Treatment Goals addressed: Patient will score less than 10 on the patient health questionnaire ? ?Progress on goals: Progressing ? ?Interventions: CBT and Supportive ?            ?Summary: [name] is a 71 y.o. female who presents with  long standing history of recurrent periods  of depression beginning when she was 65 and her favorite uncle died. Patient reports multiple psychiatric hospitlaizations due to depression and suicidal ideations with the last one occuring in 1997. Patient has participated in outpatient psychotherapy and medication management intermittently since age 66.  She currently is seeing psychiatrist Dr. Harrington Challenger . Prior to this, she was seen at [facility]. Patient also has had ECT at [facility]. Symptoms have worsened in recent months due to family stress and have  included depressed mood, anxiety, excessive worry, and tearfulness.        ? ?Patient's last contact was by virtual visit via telephone about 2-3 weeks ago.   She continues to experience symptoms of depression as  reflected in PHQ-9.  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  Patient suspects a CNA is stealing items from her home.  She expresses frustration as she has encountered this problem with past nursing aides.  She has complained to management and expresses frustration as she states people do not seem to believe her.  Patient reports this causes her to feel depressed.  She also reports continued physical pain.  She expresses reluctance to take medication as she fears she will be accused of abusing her medication as this accusation has been made in the past.   ? ?   Suicidal/Homicidal: Nowithout intent/plan ?     ?Therapist Response: Reviewed symptoms, administered PHQ-9, discussed results with patient, discussed stressors, facilitated expression of thoughts and feelings, validated feelings, assisted patient problem solve regarding options and working with healthcare agency, assisted patient identify ways to improve assertive communication, assisted patient identify/challenge/and replace negative thoughts about taking pain medication as prescribed  ?Diagnosis: Axis I: MDD, Recurrent, moderate ? ?  Axis II: Deferred ?Collaboration of Care: Psychiatrist AEB patient working with psychiatrist Dr. Harrington Challenger ? ?Patient/Guardian was advised Release of Information must be obtained prior to any record release in order to collaborate their care with an outside provider. Patient/Guardian was advised if they have not already done so to contact the registration department to sign all necessary forms in order for [name] to release information regarding their care.  ? ?Consent: Patient/Guardian gives verbal consent for treatment and assignment of  benefits for services provided during this visit. Patient/Guardian expressed understanding and agreed to proceed.  ? ? ?Angelissa Supan E Cashmere Harmes, LCSW ? ? ?

## 2021-05-31 ENCOUNTER — Encounter (HOSPITAL_COMMUNITY): Payer: Self-pay | Admitting: Internal Medicine

## 2021-05-31 ENCOUNTER — Telehealth: Payer: Self-pay | Admitting: Internal Medicine

## 2021-05-31 DIAGNOSIS — M25512 Pain in left shoulder: Secondary | ICD-10-CM | POA: Diagnosis not present

## 2021-05-31 NOTE — Telephone Encounter (Signed)
Spoke to patient.  ?She is requesting a Rx to help her stop smoking.  ?She is currently at 1PPD.  ?She tried and failed nicotine inhaler and patches.  ? ?[name], please advise. Thanks ?

## 2021-05-31 NOTE — Telephone Encounter (Signed)
Routing to [name] as Juluis Rainier ?

## 2021-06-01 DIAGNOSIS — D329 Benign neoplasm of meninges, unspecified: Secondary | ICD-10-CM | POA: Diagnosis not present

## 2021-06-01 NOTE — Telephone Encounter (Signed)
She is already on Welbutrin, which is what is usually prescribed for this. Please advise her to discuss with her psychiatrist.  ?

## 2021-06-02 ENCOUNTER — Ambulatory Visit (INDEPENDENT_AMBULATORY_CARE_PROVIDER_SITE_OTHER): Payer: Medicare HMO | Admitting: Pharmacist

## 2021-06-02 ENCOUNTER — Other Ambulatory Visit: Payer: Self-pay

## 2021-06-02 DIAGNOSIS — Z0100 Encounter for examination of eyes and vision without abnormal findings: Secondary | ICD-10-CM | POA: Diagnosis not present

## 2021-06-02 DIAGNOSIS — Z79899 Other long term (current) drug therapy: Secondary | ICD-10-CM

## 2021-06-02 DIAGNOSIS — E1143 Type 2 diabetes mellitus with diabetic autonomic (poly)neuropathy: Secondary | ICD-10-CM

## 2021-06-02 DIAGNOSIS — E782 Mixed hyperlipidemia: Secondary | ICD-10-CM

## 2021-06-02 DIAGNOSIS — F060 Psychotic disorder with hallucinations due to known physiological condition: Secondary | ICD-10-CM

## 2021-06-02 DIAGNOSIS — N1831 Chronic kidney disease, stage 3a: Secondary | ICD-10-CM

## 2021-06-02 DIAGNOSIS — R0989 Other specified symptoms and signs involving the circulatory and respiratory systems: Secondary | ICD-10-CM

## 2021-06-02 MED ORDER — NICOTROL NS 10 MG/ML NA SOLN: NASAL | 5 refills | Status: DC

## 2021-06-02 NOTE — Telephone Encounter (Signed)
Called patient but she did not answer. Left message for her to call us back.  

## 2021-06-02 NOTE — Patient Instructions (Signed)
[name], ? ?It was great to talk to you today! ? ?Please call me with any questions or concerns. ? ?Visit Information ? ?Following are the goals we discussed today:  ? Goals Addressed   ? ?  ?  ?  ?  ? This Visit's Progress  ?  Medication Management     ?  Patient Goals/Self-Care Activities ?Patient will:  ?Focus on medication adherence by keeping up with prescription refills and either using a pill box or reminders to take your medications at the prescribed times ?Check blood sugar four times a day at the following times: fasting (at least 8 hours since last food consumption), 1-2 hours after all meals, and whenever patient experiences symptoms of hypo/hyperglycemia, document, and provide at future appointments ?Follow-up with all specialists as scheduled ? ? ?  ? ?  ?  ? ?Follow-up plan: Next PCP appointment scheduled for: 09/07/21 ? ?Patient verbalizes understanding of instructions and care plan provided today and agrees to view in [place]. Active MyChart status confirmed with patient.   ? ?Please call the care guide team at [phone] if you need to cancel or reschedule your appointment.  ? ?Kennon Holter, PharmD, BCACP, CPP ?Clinical Pharmacist Practitioner ?[place] ?[phone]  ?

## 2021-06-02 NOTE — Telephone Encounter (Signed)
Nicotrol nasal solution sent to [facility] ?

## 2021-06-02 NOTE — Telephone Encounter (Signed)
Called and spoke with patient. I explained to her how to use the Nicotrol inhaler and its purpose. She is interested in this. She wanted to know how much it would cost. I advised her that the only way we will know is to send a prescription to the pharmacy. ? ?She would like to use [name] in [place].  ? ?[name], please advise. Thanks!  ?

## 2021-06-02 NOTE — Chronic Care Management (AMB) (Signed)
? ? ?Chronic Care Management ?Pharmacy Note ? ?06/02/2021 ?Name:  [name] MRN:  [id number] DOB:  1950-04-21 ? ?Summary: ?Medication reconciliation: ?Patient brought in all home medications today. Completed thorough medication reconciliation.  ?Patient has a home health nurse come to her house every other Monday to fill her pill box.  ? ?Type 2 Diabetes ?Recent change: patient reports nephrology recently prescribed Jardiance for chronic kidney disease but she has not yet started due to concern for hypoglycemia. ?Taking medications as directed: no, patient reports she is taking Novolog 14 units before small meals and 17 units before large meals ?Emergency hypoglycemia treatment: recommend that endocrinology consider prescribing glucagon due to tight blood glucose control and high risk for level 2-3 hypoglycemia ?Continue current medications as above per endocrinology. Given recent consideration of Jardiance, may need to decrease insulin requirements to prevent hypoglycemia ?Continue follow-up with endocrinology ? ?Hyperlipidemia ?Triglycerides above goal of <150 per 2020 AACE/ACE guidelines. ?Triglycerides likely elevated due to current smoking status ? ?Chronic Kidney Disease Stage 3a ?Current medications: losartan 100 mg by mouth once daily, empagliflozin (Jardiance) 10 mg by mouth once daily, spironolactone 25 mg by mouth once daily, and rosuvastatin 5 mg by mouth once daily ?Recent change: patient reports nephrology recently prescribed Jardiance for chronic kidney disease but she has not yet started due to concern for hypoglycemia. ?Most recent GFR: 52 (05/10/21) mL/min ?Continue current medications as above ?Continue follow-up with nephrology  ? ?Subjective: ?[name] is an 71 y.o. year old female who is a primary patient of Moshe Cipro, Norwood Levo, MD.  The CCM team was consulted for assistance with disease management and care coordination needs.   ? ?Engaged with patient face to face for follow up visit in  response to provider referral for pharmacy case management and/or care coordination services.  ? ?Consent to Services:  ?The patient was given information about Chronic Care Management services, agreed to services, and gave verbal consent prior to initiation of services.  Please see initial visit note for detailed documentation.  ? ?Patient Care Team: ?[name], MD as PCP - General ?Herminio Commons, MD (Inactive) as PCP - Cardiology (Cardiology) ?Rourk, Cristopher Estimable, MD (Gastroenterology) ?Deneise Lever, MD as Consulting Physician (Pulmonary Disease) ?Jovita Gamma, MD as Consulting Physician (Neurosurgery) ?Suella Broad, MD as Consulting Physician (Physical Medicine and Rehabilitation) ?Pieter Partridge, DO as Consulting Physician (Neurology) ?Lavonna Monarch, MD as Consulting Physician (Dermatology) ?Leta Baptist, MD as Consulting Physician (Otolaryngology) ?Latanya Maudlin, MD as Consulting Physician (Orthopedic Surgery) ?Cloria Spring, MD as Consulting Physician [facility]) ?Melina Schools, MD as Consulting Physician (Orthopedic Surgery) ?Lendon Colonel, NP as Nurse Practitioner (Nurse Practitioner) ?Milus Banister, MD as Attending Physician (Gastroenterology) ?Pyrtle, Lajuan Lines, MD as Consulting Physician (Gastroenterology) ?Monico Hoar, PT as Physical Therapist (Physical Therapy) ?Conrad Mayview, MD as Consulting Physician (Vascular Surgery) ?Inocencio Homes, DPM as Consulting Physician (Podiatry) ?Newt Minion, MD as Consulting Physician (Orthopedic Surgery) ?Lavonna Monarch, MD as Consulting Physician (Dermatology) ?Beryle Lathe, [facility] (Pharmacist) ?Kassie Mends, RN as [place] Management ? ?Objective: ? ?Lab Results  ?Component Value Date  ? CREATININE 1.13 (H) 05/10/2021  ? CREATININE 1.23 (H) 12/23/2020  ? CREATININE 1.10 (H) 11/24/2020  ? ? ?Lab Results  ?Component Value Date  ? HGBA1C 5.9 (A) 05/20/2021  ? ?Last diabetic Eye exam:  ?Lab Results   ?Component Value Date/Time  ? HMDIABEYEEXA No Retinopathy 05/26/2020 12:00 AM  ?  ?Last diabetic Foot  exam:  ?Lab Results  ?Component Value Date/Time  ? HMDIABFOOTEX yes 09/14/2009 12:00 AM  ?  ? ?   ?Component Value Date/Time  ? CHOL 155 05/10/2021 1137  ? TRIG 200 (H) 05/10/2021 1137  ? HDL 42 05/10/2021 1137  ? CHOLHDL 3.7 05/10/2021 1137  ? CHOLHDL 3.5 09/30/2018 1143  ? VLDL 26 07/19/2015 0925  ? [place] 79 05/10/2021 1137  ? [place] 79 09/30/2018 1143  ? ? ? ?  Latest Ref Rng & Units 05/10/2021  ? 11:37 AM 12/23/2020  ? 12:44 PM 11/24/2020  ?  1:08 PM  ?Hepatic Function  ?Total Protein 6.0 - 8.5 g/dL 6.6   7.1   7.8    ?Albumin 3.8 - 4.8 g/dL 4.2   3.8   4.3    ?AST 0 - 40 IU/L 21   20   24     ?ALT 0 - 32 IU/L 24   24   25     ?Alk Phosphatase 44 - 121 IU/L 89   78   108    ?Total Bilirubin 0.0 - 1.2 mg/dL <0.2   0.6   0.2    ? ? ?Lab Results  ?Component Value Date/Time  ? TSH 0.85 05/19/2021 03:33 PM  ? TSH 0.631 12/11/2019 12:25 PM  ? FREET4 0.81 05/19/2021 03:33 PM  ? FREET4 0.78 02/17/2019 04:26 PM  ? ? ? ?  Latest Ref Rng & Units 05/06/2021  ?  2:23 PM 12/23/2020  ? 12:44 PM 11/24/2020  ?  1:08 PM  ?CBC  ?WBC 4.0 - 10.5 K/uL 11.7   5.6   5.8    ?Hemoglobin 12.0 - 15.0 g/dL 9.6   10.5   11.3    ?Hematocrit 36.0 - 46.0 % 33.3   36.5   39.6    ?Platelets 150 - 400 K/uL 234   254   235    ? ? ?Lab Results  ?Component Value Date/Time  ? VD25OH 45.5 09/01/2020 02:44 PM  ? VD25OH 44.60 01/28/2020 12:24 PM  ? ? ?Clinical ASCVD: No  ?The 10-year ASCVD risk score (Arnett DK, et al., 2019) is: 42.3% ?  Values used to calculate the score: ?    Age: 47 years ?    Sex: Female ?    Is Non-Hispanic African American: Yes ?    Diabetic: Yes ?    Tobacco smoker: Yes ?    Systolic Blood Pressure: 500 mmHg ?    Is BP treated: Yes ?    HDL Cholesterol: 42 mg/dL ?    Total Cholesterol: 155 mg/dL   ? ?Social History  ? ?Tobacco Use  ?Smoking Status Every Day  ? Years: 30.00  ? Types: Cigarettes  ?Smokeless Tobacco Never  ?Tobacco  Comments  ? 3-4 a day MRC 05/17/2021  ? ?BP Readings from Last 3 Encounters:  ?05/26/21 139/61  ?05/19/21 120/80  ?05/17/21 116/60  ? ?Pulse Readings from Last 3 Encounters:  ?05/26/21 70  ?05/19/21 67  ?05/17/21 60  ? ?Wt Readings from Last 3 Encounters:  ?05/26/21 170 lb 1.6 oz (77.2 kg)  ?05/19/21 167 lb 9.6 oz (76 kg)  ?05/17/21 167 lb 6.4 oz (75.9 kg)  ? ? ?Assessment: Review of patient past medical history, allergies, medications, health status, including review of consultants reports, laboratory and other test data, was performed as part of comprehensive evaluation and provision of chronic care management services.  ? ?SDOH:  (Social Determinants of Health) assessments and interventions performed:  ? ? ?CCM Care  Plan ? ?Allergies  ?Allergen Reactions  ? Iron Nausea And Vomiting  ?  And itching ?And itching  ? Lac Bovis Rash  ?  Doesn't agree with stomach.  ?Doesn't agree with stomach.  ?Doesn't agree with stomach.   ? Penicillins Hives  ?  Has patient had a PCN reaction causing immediate rash, facial/tongue/throat swelling, SOB or lightheadedness with hypotension: Yes ?Has patient had a PCN reaction causing severe rash involving mucus membranes or skin necrosis: No ?Has patient had a PCN reaction that required hospitalization No ?Has patient had a PCN reaction occurring within the last 10 years: No ?If all of the above answers are "NO", then may proceed with Cephalosporin use. ? ?Other reaction(s): Other (see comments) ?Has patient had a PCN reaction causing immediate rash, facial/tongue/throat swelling, SOB or lightheadedness with hypotension: Yes ?Has patient had a PCN reaction causing severe rash involving mucus membranes or skin necrosis: No ?Has patient had a PCN reaction that required hospitalization No ?Has patient had a PCN reaction occurring within the last 10 years: No ?If all of the above answers are "NO", then may proceed with Cephalosporin use. ?Has patient had a PCN reaction causing immediate  rash, facial/tongue/throat swelling, SOB or lightheadedness with hypotension: Yes ?Has patient had a PCN reaction causing severe rash involving mucus membranes or skin necrosis: No ?Has patient had a PCN reacti

## 2021-06-02 NOTE — Telephone Encounter (Signed)
Would she be wiling to try Nicotrol inhaler? ?

## 2021-06-02 NOTE — Telephone Encounter (Signed)
Called and spoke with patient. She confirmed that she is on Wellbutrin but stated she has been on this medication for a while and it is not helping with her smoking cessation.  ? ?She called 1-800-Quit-Now and was advised to ask about Zyban. I advised her that the Zyban is the same medication as Wellbutrin.  ? ?I explained to her that [name] would like for her to discuss this or a possible increase with her psychiatrist but she stated that she has before in the past and the psychiatrist suggested she reach out to our office. She is desperate to stop smoking.  ? ?[name], can you please advise? Thanks!  ?

## 2021-06-04 ENCOUNTER — Other Ambulatory Visit: Payer: Self-pay | Admitting: Physician Assistant

## 2021-06-08 ENCOUNTER — Encounter (HOSPITAL_COMMUNITY): Payer: Self-pay | Admitting: Internal Medicine

## 2021-06-08 DIAGNOSIS — E039 Hypothyroidism, unspecified: Secondary | ICD-10-CM | POA: Diagnosis not present

## 2021-06-08 DIAGNOSIS — J42 Unspecified chronic bronchitis: Secondary | ICD-10-CM

## 2021-06-08 DIAGNOSIS — I10 Essential (primary) hypertension: Secondary | ICD-10-CM

## 2021-06-08 DIAGNOSIS — E1142 Type 2 diabetes mellitus with diabetic polyneuropathy: Secondary | ICD-10-CM

## 2021-06-08 DIAGNOSIS — F17210 Nicotine dependence, cigarettes, uncomplicated: Secondary | ICD-10-CM

## 2021-06-08 DIAGNOSIS — F250 Schizoaffective disorder, bipolar type: Secondary | ICD-10-CM

## 2021-06-08 DIAGNOSIS — R531 Weakness: Secondary | ICD-10-CM

## 2021-06-08 DIAGNOSIS — K5730 Diverticulosis of large intestine without perforation or abscess without bleeding: Secondary | ICD-10-CM

## 2021-06-08 DIAGNOSIS — D649 Anemia, unspecified: Secondary | ICD-10-CM

## 2021-06-08 DIAGNOSIS — J449 Chronic obstructive pulmonary disease, unspecified: Secondary | ICD-10-CM

## 2021-06-08 DIAGNOSIS — M160 Bilateral primary osteoarthritis of hip: Secondary | ICD-10-CM

## 2021-06-08 DIAGNOSIS — R2681 Unsteadiness on feet: Secondary | ICD-10-CM

## 2021-06-09 ENCOUNTER — Encounter: Payer: Self-pay | Admitting: *Deleted

## 2021-06-09 ENCOUNTER — Ambulatory Visit (INDEPENDENT_AMBULATORY_CARE_PROVIDER_SITE_OTHER): Payer: Medicare HMO | Admitting: Cardiology

## 2021-06-09 ENCOUNTER — Encounter: Payer: Self-pay | Admitting: Cardiology

## 2021-06-09 VITALS — BP 152/60 | HR 61 | Ht 59.0 in | Wt 165.4 lb

## 2021-06-09 DIAGNOSIS — E7800 Pure hypercholesterolemia, unspecified: Secondary | ICD-10-CM | POA: Diagnosis not present

## 2021-06-09 DIAGNOSIS — G4733 Obstructive sleep apnea (adult) (pediatric): Secondary | ICD-10-CM

## 2021-06-09 DIAGNOSIS — R079 Chest pain, unspecified: Secondary | ICD-10-CM | POA: Diagnosis not present

## 2021-06-09 DIAGNOSIS — I10 Essential (primary) hypertension: Secondary | ICD-10-CM | POA: Diagnosis not present

## 2021-06-09 DIAGNOSIS — I6523 Occlusion and stenosis of bilateral carotid arteries: Secondary | ICD-10-CM

## 2021-06-09 NOTE — Patient Instructions (Signed)
Medication Instructions: ?INCREASE Crestor to 10 mg daily at bedtime ? ?Labwork: ?Lipids,ALT in 6 weeks at [facility] lab ? ?Testing/Procedures:Your physician has requested that you have a lexiscan myoview. For further information please visit [email or web address]. Please follow instruction sheet, as given. ? ?Your physician has requested that you have a carotid duplex. This test is an ultrasound of the carotid arteries in your neck. It looks at blood flow through these arteries that supply the brain with blood. Allow one hour for this exam. There are no restrictions or special instructions. ? ?Follow-Up: ?1 year ? ?Any Other Special Instructions Will Be Listed Below (If Applicable). ? ?If you need a refill on your cardiac medications before your next appointment, please call your pharmacy. ? ?

## 2021-06-09 NOTE — Progress Notes (Signed)
? ?Cardiology Office Note   ? ?Date:  06/09/2021  ? ?ID:  [name], DOB [birth year], MRN [id number] ? ? ?PCP:  [name], MD ?  ?[place]  ?Cardiologist:  Kate Sable, MD (Inactive)   ?Advanced Practice Provider:  No care team member to display ?Electrophysiologist:  None }  ? ?Chief Complaint  ?Patient presents with  ? Hypertension  ? Hyperlipidemia  ? ? ?History of Present Illness:  ?[name] is a 71 y.o. female  with history of labile hypertension, hyperlipidemia, history of chest pain with normal cardiac cath in 2007, low risk nuclear stress test 07/2017, OSA on CPAP followed by pulmonary, anxiety depression  ? ?Patient had ultrasound for leg swelling 02/27/2020 that showed no DVT but mild to moderate superficial edema and entire medial calf on the right no common femoral vein obstruction on the left. ?  ?She is here today for followup and is doing well.  She has chronic chest pain and wheezing that occurs with exertion that she thinks has gotten worse.  It occurs sporadically and can last up to 10 minutes and then goes away when she takes an ASA.  She also says that when she gets emotionally upset she gets pain in her gums.  She has chronic neck and shoulder pain from cervical DJD and rotator cuff.  She denies any PND, orthopnea, LE edema, dizziness, palpitations or syncope. She is compliant with her meds and is tolerating meds with no SE.    ? ?Past Medical History:  ?Diagnosis Date  ? Allergy   ? Anemia   ? Anxiety   ? takes Ativan daily  ? Arthritis   ? Assistance needed for mobility   ? Bipolar disorder ([place])   ? takes Risperdal nightly  ? Blood transfusion   ? Brain tumor ([place])   ? Cancer [facility])   ? In her gum  ? Carpal tunnel syndrome of right wrist 05/23/2011  ? Cervical disc disorder with radiculopathy of cervical region 10/31/2012  ? Chronic back pain   ? Chronic idiopathic constipation   ? Chronic neck and back pain   ? Colon polyps   ? COPD (chronic  obstructive pulmonary disease) with chronic bronchitis ([place]) 09/16/2013  ? Office Spirometry 10/30/2013-submaximal effort based on appearance of loop and curve. Numbers would fit with severe restriction but her physiologic capability may be better than this. FVC 0.91/44%, and 10.74/45%, FEV1/FVC 0.81, FEF 25-75% 1.43/69%    ? Diabetes mellitus   ? Type II  ? Diverticulosis   ? TCS 9/08 by Dr. Delfin Edis for diarrhea . Bx for micro scopic colitis negative.   ? Fibromyalgia   ? Frequent falls   ? GERD (gastroesophageal reflux disease)   ? takes Aciphex daily  ? Glaucoma   ? eye drops daily  ? Gum symptoms   ? infection on antibiotic  ? Hemiplegia affecting non-dominant side, post-stroke 08/02/2011  ? Hiatal hernia   ? Hyperlipidemia   ? takes Crestor daily  ? Hypertension   ? takes Amlodipine,Metoprolol,and Clonidine daily  ? Hypothyroidism   ? takes Synthroid daily  ? IBS (irritable bowel syndrome)   ? Insomnia   ? takes Trazodone nightly  ? Major depression, recurrent ([place])   ? takes Zoloft daily  ? Malignant hyperpyrexia 04/25/2017  ? Metabolic encephalopathy 0/71/2197  ? Migraines   ? chronic headaches  ? Mononeuritis lower limb   ? Narcolepsy   ? Osteoporosis   ?  Pancreatitis 2006  ? due to Depakote with normal EUS   ? Schatzki's ring   ? non critical / EGD with ED 8/2011with RMR  ? Seizures ([place])   ? takes Lamictal daily.Last seizure 3 yrs ago  ? Sleep apnea   ? on CPAP  ? Small bowel obstruction ([place])   ? Stroke [facility])   ? left sided weakness, speech changes  ? Tubular adenoma of colon   ? ? ?Past Surgical History:  ?Procedure Laterality Date  ? ABDOMINAL HYSTERECTOMY  1978  ? BACK SURGERY  July 2012  ? BACTERIAL OVERGROWTH TEST N/A 05/05/2013  ? Procedure: BACTERIAL OVERGROWTH TEST;  Surgeon: Daneil Dolin, MD;  [facility];  Service: Endoscopy;  Laterality: N/A;  7:30  ? BIOPSY THYROID  2009  ? BRAIN SURGERY  11/2011  ? resection of meningioma  ? BREAST REDUCTION SURGERY  1994  ? CARDIAC  CATHETERIZATION  05/10/2005  ? normal coronaries, normal LV systolic function and EF (Dr. Jackie Plum)  ? CARPAL TUNNEL RELEASE Left 07/22/04  ? Dr. Aline Brochure  ? CATARACT EXTRACTION Bilateral   ? CHOLECYSTECTOMY  1984  ? COLONOSCOPY N/A 09/25/2012  ? HER:DEYCXKG diverticulosis.  colonic polyp-removed : tubular adenoma  ? CRANIOTOMY  11/23/2011  ? Procedure: CRANIOTOMY TUMOR EXCISION;  Surgeon: Hosie Spangle, MD;  Location: [place] NEURO ORS;  Service: Neurosurgery;  Laterality: N/A;  Craniotomy for tumor resection  ? ESOPHAGOGASTRODUODENOSCOPY  12/29/2010  ? Rourk-Retained food in the esophagus and stomach, small hiatal hernia, status post Maloney dilation of the esophagus  ? ESOPHAGOGASTRODUODENOSCOPY N/A 09/25/2012  ? YJE:HUDJSHFW atonic baggy esophagus status post Maloney dilation 55 F. Hiatal hernia  ? GIVENS CAPSULE STUDY N/A 01/15/2013  ? NORMAL.   ? IR GENERIC HISTORICAL  03/17/2016  ? IR RADIOLOGIST EVAL & MGMT 03/17/2016 [facility]-INTERV RAD  ? LESION REMOVAL N/A 05/31/2015  ? Procedure: REMOVAL RIGHT AND LEFT LESIONS OF MANDIBLE;  Surgeon: Diona Browner, DDS;  Location: [facility];  Service: Oral Surgery;  Laterality: N/A;  ? MALONEY DILATION  12/29/2010  ? RMR;  ? NM MYOCAR PERF WALL MOTION  2006  ? "relavtiely normal" persantine, mild anterior thinning (breast attenuation artifact), no region of scar/ischemia  ? OVARIAN CYST REMOVAL    ? RECTOCELE REPAIR N/A 06/29/2015  ? Procedure: POSTERIOR REPAIR (RECTOCELE);  Surgeon: Jonnie Kind, MD;  Location: [facility];  Service: Gynecology;  Laterality: N/A;  ? REDUCTION MAMMAPLASTY Bilateral   ? SPINE SURGERY  09/29/2010  ? Dr. Rolena Infante  ? surgical excision of 3 tumors from right thigh and right buttock  and left upper thigh  2010  ? TOOTH EXTRACTION Bilateral 12/14/2014  ? Procedure: REMOVAL OF BILATERAL MANDIBULAR EXOSTOSES;  Surgeon: Diona Browner, DDS;  Location: [place];  Service: Oral Surgery;  Laterality: Bilateral;  ? TRANSTHORACIC ECHOCARDIOGRAM  2010  ? EF 60-65%, mild conc LVH, grade 1  diastolic dysfunction; mildly calcified MV annulus with mildly thickened leaflets, mildly calcified MR annulus  ? ? ?Current Medications: ?Current Meds  ?Medication Sig  ? ACCU-CHEK GUIDE test strip TEST BLOOD SUGAR FOUR TIMES DAILY  AND AS NEEDED  ? Accu-Chek Softclix Lancets lancets TEST BLOOD SUGAR THREE TIMES DAILY AS DIRECTED  ? albuterol (VENTOLIN HFA) 108 (90 Base) MCG/ACT inhaler INHALE 1 TO 2 PUFFS EVERY 6 HOURS AS NEEDED FOR WHEEZING, SHORTNESS OF BREATH  ? Alcohol Swabs (DROPSAFE ALCOHOL PREP) 70 % PADS USE TO CLEAN FINGER PRIOR TO TESTING FOR BLOOD SUGAR  AS DIRECTED  ?  alendronate (FOSAMAX) 70 MG tablet TAKE 1 TABLET (70 MG TOTAL) BY MOUTH EVERY 7 (SEVEN) DAYS. TAKE WITH A FULL GLASS OF WATER ON AN EMPTY STOMACH.  ? amLODipine (NORVASC) 10 MG tablet TAKE 1 TABLET EVERY DAY  ? aspirin EC 81 MG tablet Take 1 tablet (81 mg total) by mouth daily with breakfast.  ? Blood Glucose Calibration (ACCU-CHEK GUIDE CONTROL) LIQD USE AS DIRECTED  ? blood glucose meter kit and supplies Dispense based on patient and insurance preference. Use up to four times daily as directed. (FOR ICD-10 E10.9, E11.9).  ? buPROPion (WELLBUTRIN XL) 150 MG 24 hr tablet Take 1 tablet (150 mg total) by mouth every morning.  ? Calcium Carb-Cholecalciferol (CALTRATE 600+D3 SOFT) 600-20 MG-MCG CHEW Chew 1 tablet by mouth daily at 12 noon.  ? cetirizine (ZYRTEC) 10 MG tablet Take 1 tablet (10 mg total) by mouth daily.  ? Cholecalciferol 50 MCG (2000 UT) TABS Take 1 tablet by mouth daily.  ? Clobetasol Prop Emollient Base (CLOBETASOL PROPIONATE E) 0.05 % emollient cream Apply to affected area qd  ? Continuous Blood Gluc Sensor (FREESTYLE LIBRE 14 DAY SENSOR) MISC 1 each by Does not apply route every 14 (fourteen) days. Change every 2 weeks  ? diclofenac Sodium (VOLTAREN) 1 % GEL APPLY 2 GRAMS  TOPICALLY FOUR TIMES DAILY. (Patient taking differently: Apply 2 g topically 4 (four) times daily.)  ? dicyclomine (BENTYL) 10 MG capsule Take by mouth.   ? Docusate Sodium (DSS) 100 MG CAPS Take by mouth.  ? DROPLET PEN NEEDLES 31G X 8 MM MISC USE FOR INJECTING INSULIN 4 TIMES DAILY.  ? ezetimibe (ZETIA) 10 MG tablet TAKE 1 TABLET EVERY DAY (Patient taking differently

## 2021-06-10 DIAGNOSIS — E782 Mixed hyperlipidemia: Secondary | ICD-10-CM

## 2021-06-10 DIAGNOSIS — N1831 Chronic kidney disease, stage 3a: Secondary | ICD-10-CM | POA: Diagnosis not present

## 2021-06-10 DIAGNOSIS — R0989 Other specified symptoms and signs involving the circulatory and respiratory systems: Secondary | ICD-10-CM | POA: Diagnosis not present

## 2021-06-10 DIAGNOSIS — E1143 Type 2 diabetes mellitus with diabetic autonomic (poly)neuropathy: Secondary | ICD-10-CM | POA: Diagnosis not present

## 2021-06-13 DIAGNOSIS — J449 Chronic obstructive pulmonary disease, unspecified: Secondary | ICD-10-CM | POA: Diagnosis not present

## 2021-06-13 DIAGNOSIS — F17210 Nicotine dependence, cigarettes, uncomplicated: Secondary | ICD-10-CM | POA: Diagnosis not present

## 2021-06-13 DIAGNOSIS — M160 Bilateral primary osteoarthritis of hip: Secondary | ICD-10-CM | POA: Diagnosis not present

## 2021-06-13 DIAGNOSIS — E1142 Type 2 diabetes mellitus with diabetic polyneuropathy: Secondary | ICD-10-CM | POA: Diagnosis not present

## 2021-06-13 DIAGNOSIS — F250 Schizoaffective disorder, bipolar type: Secondary | ICD-10-CM | POA: Diagnosis not present

## 2021-06-13 DIAGNOSIS — J42 Unspecified chronic bronchitis: Secondary | ICD-10-CM | POA: Diagnosis not present

## 2021-06-13 DIAGNOSIS — R2681 Unsteadiness on feet: Secondary | ICD-10-CM | POA: Diagnosis not present

## 2021-06-13 DIAGNOSIS — D649 Anemia, unspecified: Secondary | ICD-10-CM | POA: Diagnosis not present

## 2021-06-13 DIAGNOSIS — E039 Hypothyroidism, unspecified: Secondary | ICD-10-CM | POA: Diagnosis not present

## 2021-06-17 IMAGING — CT CT HEAD W/O CM
3 series · 14 of 47 positions shown, 16 images · non-contrast
Comparison: CT 11/01/2017, MRI 04/18/2019

CLINICAL DATA: Worsening headache after striking head on door 3
days prior

EXAM:
CT HEAD WITHOUT CONTRAST
TECHNIQUE: Contiguous axial images were obtained from the base of the skull
through the vertex without intravenous contrast.

[Series 2: head w o · axial · 0.43mm/px · z∈[+32,+157]mm · 8 of 30 slices shown, 10 images]
[im 3/30  brain]
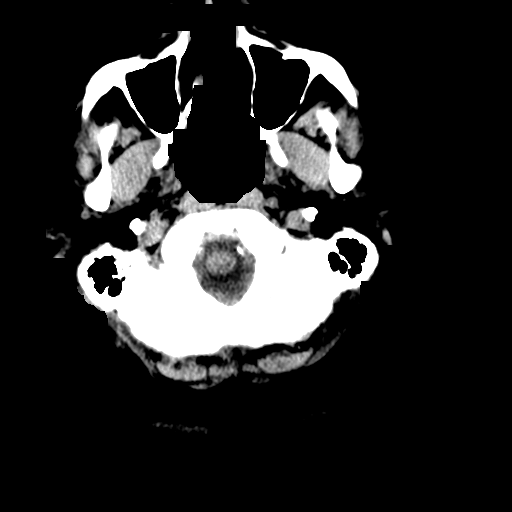
[im 3/30  bone]
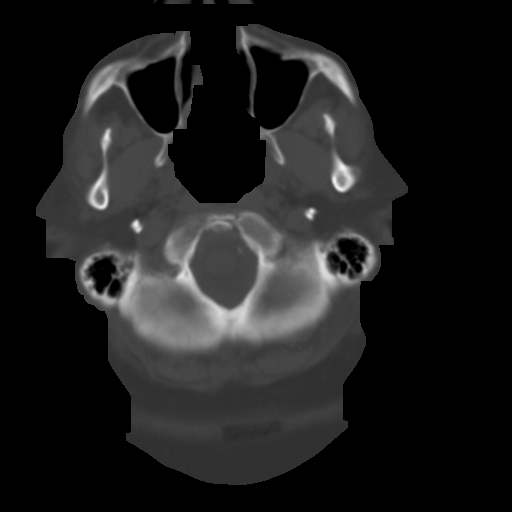
[im 7/30  brain]
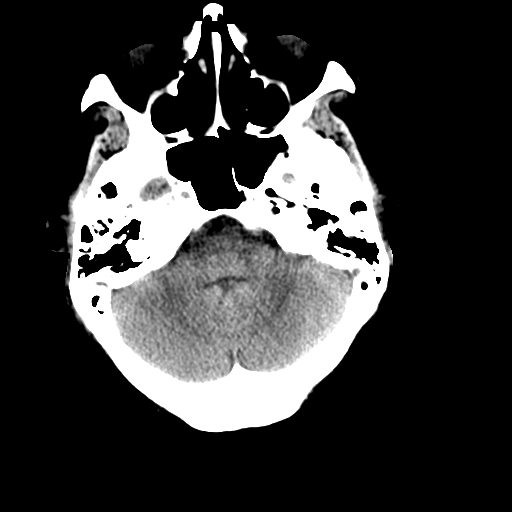
[im 10/30  brain]
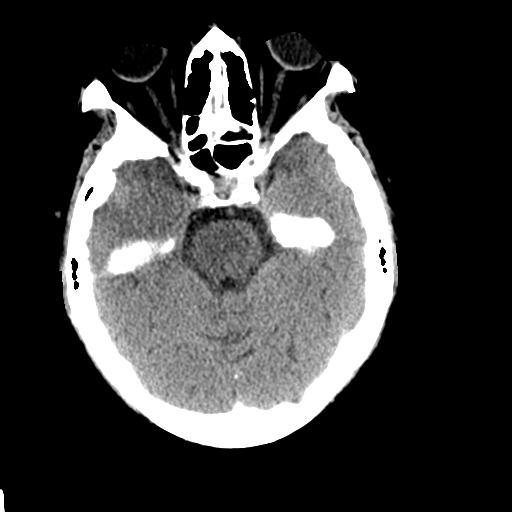
[im 14/30  brain]
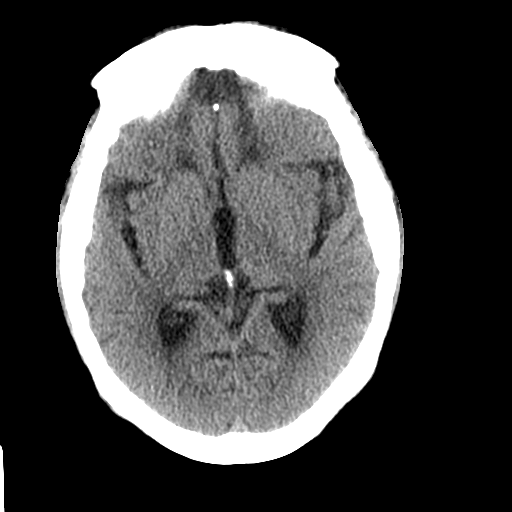
[im 17/30  brain]
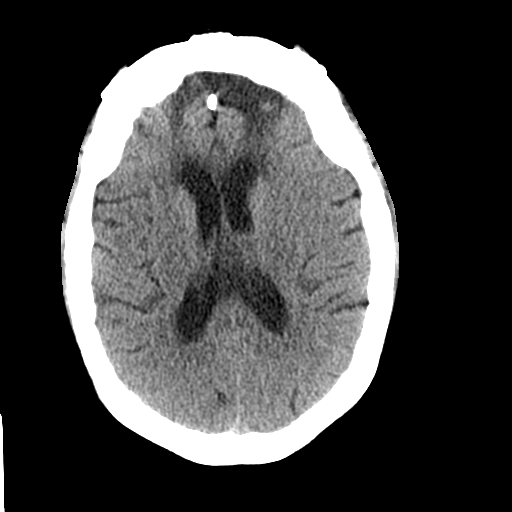
[im 17/30  bone]
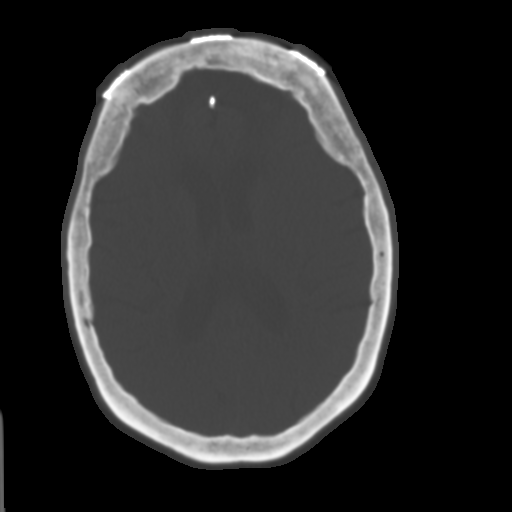
[im 21/30  brain]
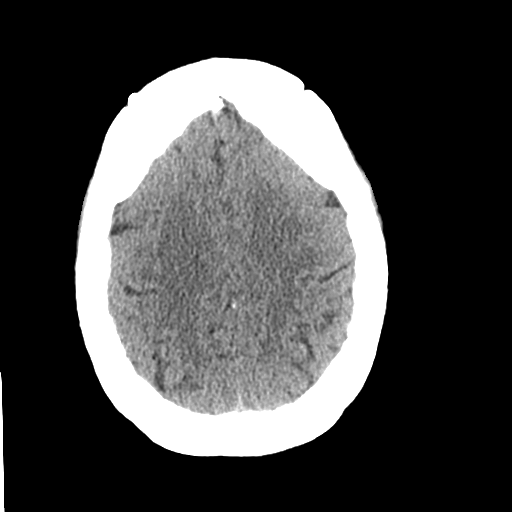
[im 24/30  brain]
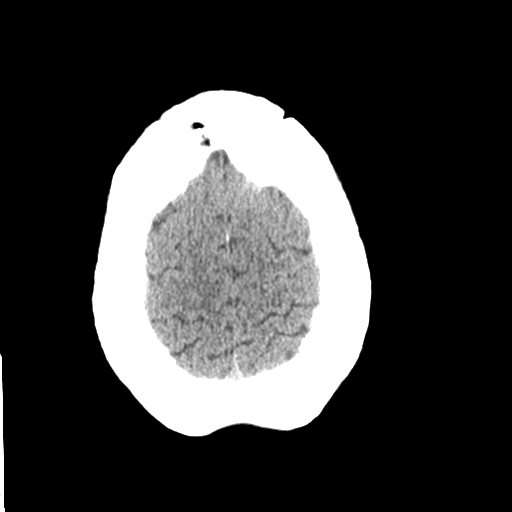
[im 28/30  brain]
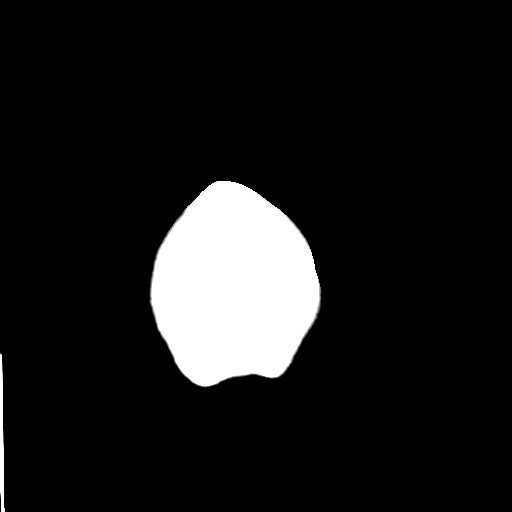

[Series 4: coronal soft · coronal · 0.31mm/px · 3 of 68 slices shown]
[im 23/68  brain]
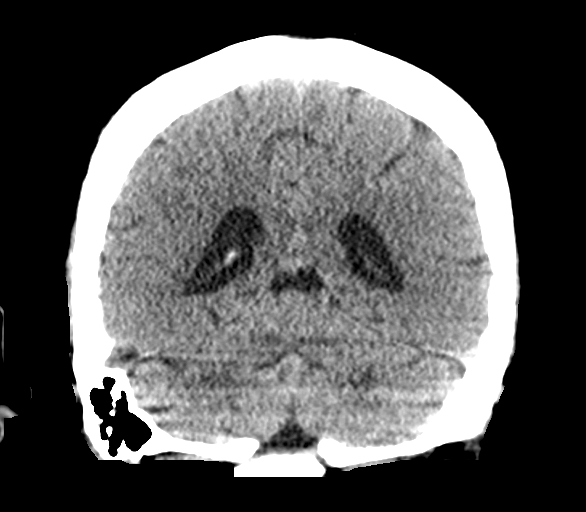
[im 30/68  brain]
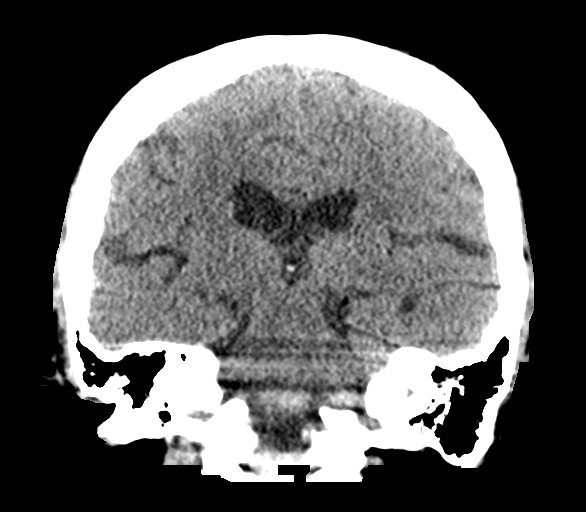
[im 38/68  brain]
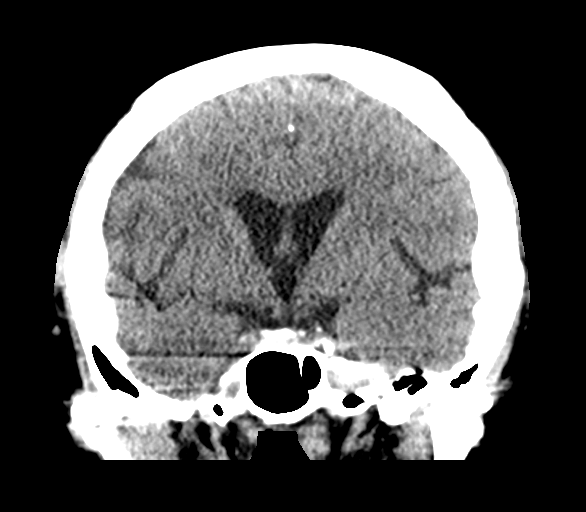

[Series 5: sagittal soft · sagittal · 0.33mm/px · 3 of 56 slices shown]
[im 19/56  brain]
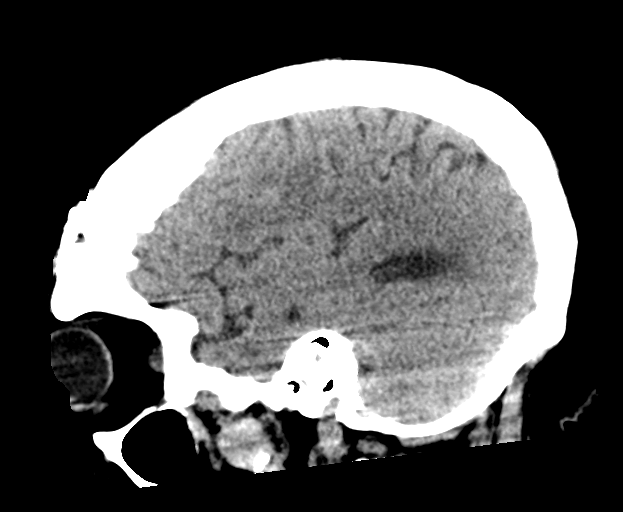
[im 28/56  brain]
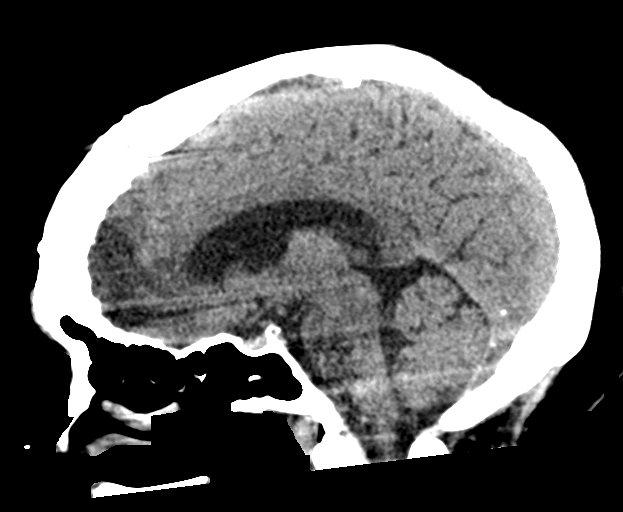
[im 37/56  brain]
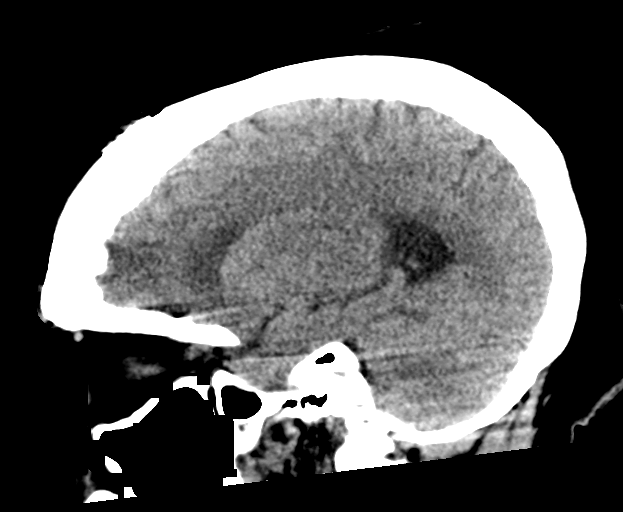

[14 of 47 positions shown; findings below may reference images not displayed]

FINDINGS: Brain: Redemonstration of the postsurgical changes of prior midline
frontal craniotomy for tumor resection with a surgical clip and
surrounding encephalomalacia in the bifrontal lobes. Known residual
tumor along the planum sphenoidale is poorly assessed on this
examination albeit with some increasing bony erosive changes when
compared to prior CT imaging 11/01/2017, best seen on the sagittal
reconstructions 11/06/2017. No evidence of acute infarction,
hemorrhage, hydrocephalus, extra-axial collection or new mass lesion
or resulting mass effect. Symmetric prominence of the ventricles,
cisterns and sulci compatible with parenchymal volume loss. Patchy
areas of white matter hypoattenuation are most compatible with
chronic microvascular angiopathy.

Vascular: Atherosclerotic calcification of the carotid siphons and
intradural vertebral arteries. No hyperdense vessel.

Skull: Postsurgical changes from midline frontal craniotomy. Mild
left frontal/supraorbital scalp swelling without subjacent calvarial
fracture. Erosions of the planum sphenoidale adjacent the known
residual mass as above. Mild benign hyperostosis frontalis interna
similar to comparison.

Sinuses/Orbits: Paranasal sinuses and mastoid air cells are
predominantly clear. Orbital structures are unremarkable aside from
prior lens extractions.

Other: None
IMPRESSION: 1. Mild left frontal/supraorbital scalp swelling without subjacent
calvarial fracture.
2. Redemonstration of the postsurgical changes of prior midline
frontal craniotomy for meningioma resection with a surgical clip and
surrounding encephalomalacia in the bifrontal lobes. Known residual
tumor along the planum sphenoidale is poorly assessed on this
noncontrast head CT albeit with some increasing bony scalloping
subjacent to the lesion when compared to prior CT imaging, not
clearly present on MR imaging either. Consider further evaluation
with contrast enhanced MRI to assess for interval growth/changed
from comparison study 04/18/2019.
3. No other acute or significant intracranial abnormality.

## 2021-06-19 ENCOUNTER — Other Ambulatory Visit: Payer: Self-pay | Admitting: Physician Assistant

## 2021-06-22 ENCOUNTER — Other Ambulatory Visit: Payer: Self-pay | Admitting: Internal Medicine

## 2021-06-22 ENCOUNTER — Other Ambulatory Visit: Payer: Self-pay | Admitting: Family Medicine

## 2021-06-23 ENCOUNTER — Telehealth (HOSPITAL_COMMUNITY): Payer: Self-pay | Admitting: Psychiatry

## 2021-06-23 ENCOUNTER — Ambulatory Visit (HOSPITAL_COMMUNITY): Payer: Medicare HMO | Admitting: Psychiatry

## 2021-06-23 ENCOUNTER — Encounter (HOSPITAL_COMMUNITY): Payer: Medicare HMO

## 2021-06-23 NOTE — Telephone Encounter (Signed)
Therapist attempted to contact patient via phone for scheduled appointment and received voicemail message.  Therapist left message indicating attempt and requesting patient call office. ?

## 2021-06-27 ENCOUNTER — Encounter (HOSPITAL_BASED_OUTPATIENT_CLINIC_OR_DEPARTMENT_OTHER)
Admission: RE | Admit: 2021-06-27 | Discharge: 2021-06-27 | Disposition: A | Discharge status: Home / Self Care | Payer: Medicare HMO | Source: Ambulatory Visit | Attending: Cardiology | Admitting: Cardiology

## 2021-06-27 ENCOUNTER — Telehealth: Payer: Self-pay | Admitting: *Deleted

## 2021-06-27 ENCOUNTER — Ambulatory Visit (HOSPITAL_COMMUNITY)
Admission: RE | Admit: 2021-06-27 | Discharge: 2021-06-27 | Disposition: A | Discharge status: Home / Self Care | Payer: Medicare HMO | Source: Ambulatory Visit | Attending: Cardiology | Admitting: Cardiology

## 2021-06-27 ENCOUNTER — Encounter (HOSPITAL_COMMUNITY)
Admission: RE | Admit: 2021-06-27 | Discharge: 2021-06-27 | Disposition: A | Discharge status: Home / Self Care | Payer: Medicare HMO | Source: Ambulatory Visit | Attending: Cardiology | Admitting: Cardiology

## 2021-06-27 DIAGNOSIS — R079 Chest pain, unspecified: Secondary | ICD-10-CM | POA: Diagnosis not present

## 2021-06-27 DIAGNOSIS — J42 Unspecified chronic bronchitis: Secondary | ICD-10-CM | POA: Diagnosis not present

## 2021-06-27 DIAGNOSIS — F250 Schizoaffective disorder, bipolar type: Secondary | ICD-10-CM | POA: Diagnosis not present

## 2021-06-27 DIAGNOSIS — I6523 Occlusion and stenosis of bilateral carotid arteries: Secondary | ICD-10-CM | POA: Diagnosis not present

## 2021-06-27 DIAGNOSIS — E1142 Type 2 diabetes mellitus with diabetic polyneuropathy: Secondary | ICD-10-CM | POA: Diagnosis not present

## 2021-06-27 DIAGNOSIS — D649 Anemia, unspecified: Secondary | ICD-10-CM | POA: Diagnosis not present

## 2021-06-27 DIAGNOSIS — E7800 Pure hypercholesterolemia, unspecified: Secondary | ICD-10-CM

## 2021-06-27 DIAGNOSIS — M160 Bilateral primary osteoarthritis of hip: Secondary | ICD-10-CM | POA: Diagnosis not present

## 2021-06-27 DIAGNOSIS — R2681 Unsteadiness on feet: Secondary | ICD-10-CM | POA: Diagnosis not present

## 2021-06-27 DIAGNOSIS — E039 Hypothyroidism, unspecified: Secondary | ICD-10-CM | POA: Diagnosis not present

## 2021-06-27 DIAGNOSIS — F17210 Nicotine dependence, cigarettes, uncomplicated: Secondary | ICD-10-CM | POA: Diagnosis not present

## 2021-06-27 DIAGNOSIS — J449 Chronic obstructive pulmonary disease, unspecified: Secondary | ICD-10-CM | POA: Diagnosis not present

## 2021-06-27 LAB — NM MYOCAR MULTI W/SPECT W/WALL MOTION / EF
LV dias vol: 67 mL (ref 46–106)
LV sys vol: 17 mL
Nuc Stress EF: 74 %
Peak HR: 79 {beats}/min
RATE: 0.3
Rest HR: 59 {beats}/min
Rest Nuclear Isotope Dose: 11 mCi
SDS: 2
SRS: 2
SSS: 4
ST Depression (mm): 0 mm
Stress Nuclear Isotope Dose: 33 mCi
TID: 1.17

## 2021-06-27 MED ORDER — SODIUM CHLORIDE FLUSH 0.9 % IV SOLN
INTRAVENOUS | Status: AC
  Administered 2021-06-27: 10 mL via INTRAVENOUS
  Filled 2021-06-27: qty 10

## 2021-06-27 MED ORDER — TECHNETIUM TC 99M TETROFOSMIN IV KIT
10.0000 | PACK | Freq: Once | INTRAVENOUS | Status: AC | PRN
  Administered 2021-06-27: 11 via INTRAVENOUS

## 2021-06-27 MED ORDER — TECHNETIUM TC 99M TETROFOSMIN IV KIT
30.0000 | PACK | Freq: Once | INTRAVENOUS | Status: AC | PRN
  Administered 2021-06-27: 33 via INTRAVENOUS

## 2021-06-27 MED ORDER — REGADENOSON 0.4 MG/5ML IV SOLN
INTRAVENOUS | Status: AC
  Administered 2021-06-27: 0.4 mg via INTRAVENOUS
  Filled 2021-06-27: qty 5

## 2021-06-27 NOTE — Telephone Encounter (Signed)
Please get a coronary calcium score since stress test was normal  ? Sueanne Margarita, MD  ?06/27/2021  2:26 PM EDT   ?  ?Please let patient know that stress test was fine  ? ?

## 2021-06-28 ENCOUNTER — Other Ambulatory Visit: Payer: Self-pay

## 2021-06-28 DIAGNOSIS — Z794 Long term (current) use of insulin: Secondary | ICD-10-CM

## 2021-06-28 MED ORDER — TOUJEO MAX SOLOSTAR 300 UNIT/ML ~~LOC~~ SOPN: 32.0000 [IU] | PEN_INJECTOR | Freq: Every evening | SUBCUTANEOUS | 1 refills | Status: DC

## 2021-06-29 ENCOUNTER — Encounter: Payer: Self-pay | Admitting: Internal Medicine

## 2021-06-29 ENCOUNTER — Ambulatory Visit (INDEPENDENT_AMBULATORY_CARE_PROVIDER_SITE_OTHER): Payer: Medicare HMO | Admitting: Internal Medicine

## 2021-06-29 VITALS — BP 100/60 | HR 60 | Ht 59.0 in | Wt 169.0 lb

## 2021-06-29 DIAGNOSIS — K5909 Other constipation: Secondary | ICD-10-CM

## 2021-06-29 DIAGNOSIS — K6389 Other specified diseases of intestine: Secondary | ICD-10-CM

## 2021-06-29 DIAGNOSIS — R140 Abdominal distension (gaseous): Secondary | ICD-10-CM

## 2021-06-29 DIAGNOSIS — K219 Gastro-esophageal reflux disease without esophagitis: Secondary | ICD-10-CM | POA: Diagnosis not present

## 2021-06-29 MED ORDER — TRULANCE 3 MG PO TABS: 3.0000 mg | ORAL_TABLET | Freq: Every day | ORAL | 2 refills | Status: DC | PRN

## 2021-06-29 MED ORDER — METRONIDAZOLE 250 MG PO TABS: 250.0000 mg | ORAL_TABLET | Freq: Three times a day (TID) | ORAL | 0 refills | Status: DC

## 2021-06-29 MED ORDER — RABEPRAZOLE SODIUM 20 MG PO TBEC: 20.0000 mg | DELAYED_RELEASE_TABLET | Freq: Two times a day (BID) | ORAL | 2 refills | Status: DC

## 2021-06-29 NOTE — Progress Notes (Signed)
? ?  Subjective:  ? ? Patient ID: [name], female    DOB: [birth year], 71 y.o.   MRN: [id number] ? ?HPI ?[name] is a 71 year old female with a history of chronic constipation, pelvic floor dysfunction with rectocele and cystocele, adenomatous colon polyps, colonic diverticulosis, GERD, prior small bowel obstruction, previous hysterectomy and cholecystectomy, hypertension, diabetes and sleep apnea who is here for follow-up.  She was last seen on 08/10/2020.  She was here for upper endoscopy and colonoscopy in August 2022. ? ?EGD performed in August 2022.  See those notes for details.  Colonoscopy revealed diverticulosis but no polyps.  EGD with mild gastritis.  Gastric lipoma.  Biopsies negative for H. pylori. ? ?She reports that she has ongoing issues with constipation as well as abdominal bloating and gas.  She has both burping and flatulence.  She is not having heartburn or dysphagia.  She continues to have chronic back and shoulder pain.  She has received cortisone injections but also takes oxycodone.  Oxycodone is prescribed every 8 hours but she uses this maybe once every 2 days.  She has been using Trulance on the days when she is at home to help with her bowel movements.  Without this she feels constipated and bloated.  She remains on Aciphex which is effective.  She is try to eat higher fiber diet with fruits and vegetables.  She is also trying to drink more water. ? ?She recently had a low risk myocardial perfusion study/stress test. ? ?Review of Systems ?As per HPI, otherwise negative ? ?Current Medications, Allergies, Past Medical History, Past Surgical History, Family History and Social History were reviewed in [employer] record. ?   ?Objective:  ? Physical Exam ?BP 100/60   Pulse 60   Ht '4\' 11"'$  (1.499 m)   Wt 169 lb (76.7 kg)   BMI 34.13 kg/m?  ?Gen: awake, alert, NAD ?HEENT: anicteric  ?CV: RRR, no mrg ?Pulm: CTA b/l ?Abd: soft, obese, nontender but mild to moderately  distended with increased tympany, no appreciable ascites, +BS throughout ?Ext: no c/c/e ?Neuro: nonfocal ? ? ?   ?Assessment & Plan:  ?71 year old female with a history of chronic constipation, pelvic floor dysfunction with rectocele and cystocele, adenomatous colon polyps, colonic diverticulosis, GERD, prior small bowel obstruction, previous hysterectomy and cholecystectomy, hypertension, diabetes and sleep apnea who is here for follow-up.  ? ?Chronic constipation exacerbated by intermittent opioid pain medication/abdominal bloating with probable SIBO/pelvic floor dysfunction --her colonoscopy was reassuring last year.  We discussed her symptoms.  Trulance works well for her but does cause some urgency so she does not take it if she has to be away from her home.  I am going to empirically treat for bacterial overgrowth ?--Continue Trulance 3 mg daily ?--Continue high-fiber diet with liberal fluid intake ?--Metronidazole 250 mg 3 times daily x7 days for SIBO/gas bloating ? ?2.  GERD --continue Aciphex 20 mg twice daily ? ?3.  CRC screening --normal colonoscopy without additional adenomatous polyps in 2022.  Will not require surveillance based on age. ? ?30 minutes total spent today including patient facing time, coordination of care, reviewing medical history/procedures/pertinent radiology studies, and documentation of the encounter. ? ? ?

## 2021-06-29 NOTE — Patient Instructions (Signed)
We have sent the following medications to your pharmacy for you to pick up at your convenience: ?Trulance, Aciphex, Flagyl  ? ?If you are age 71 or older, your body mass index should be between 23-30. Your Body mass index is 34.13 kg/m?[name] If this is out of the aforementioned range listed, please consider follow up with your Primary Care Provider. ? ?If you are age 71 or younger, your body mass index should be between 19-25. Your Body mass index is 34.13 kg/m?[name] If this is out of the aformentioned range listed, please consider follow up with your Primary Care Provider.  ? ?________________________________________________________ ? ?The [facility] providers would like to encourage you to use [facility] to communicate with providers for non-urgent requests or questions.  Due to long hold times on the telephone, sending your provider a message by [facility] may be a faster and more efficient way to get a response.  Please allow 48 business hours for a response.  Please remember that this is for non-urgent requests.  ?_______________________________________________________ ? ?Thank you for choosing me and [facility]. ? ?Dr. Ulice Dash Pyrtle  ? ?

## 2021-07-04 IMAGING — MG DIGITAL SCREENING BILAT W/ TOMO W/ CAD
6 of 10 series · 6 of 30 positions shown · non-contrast
Comparison: Previous exam(s).

ACR Breast Density Category a: The breast tissue is almost entirely
fatty.

CLINICAL DATA: Screening.

EXAM:
DIGITAL SCREENING BILATERAL MAMMOGRAM WITH TOMO AND CAD

[R CV synth-2D]
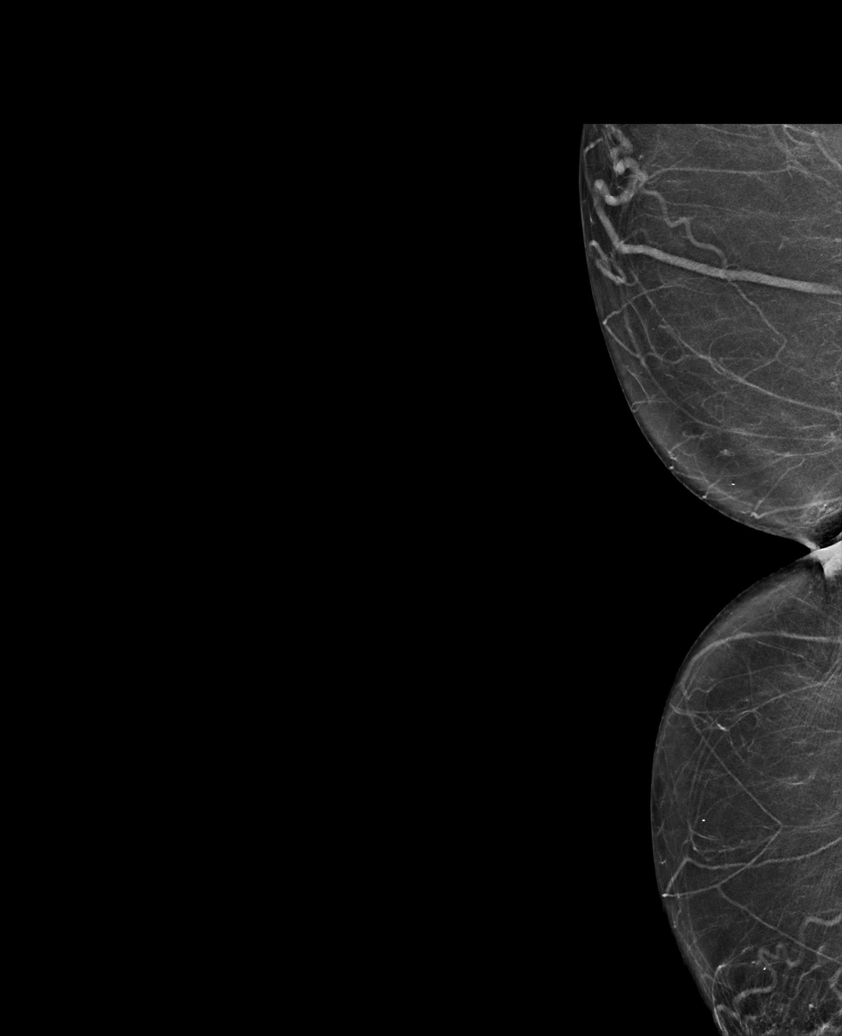

[R MLO synth-2D]
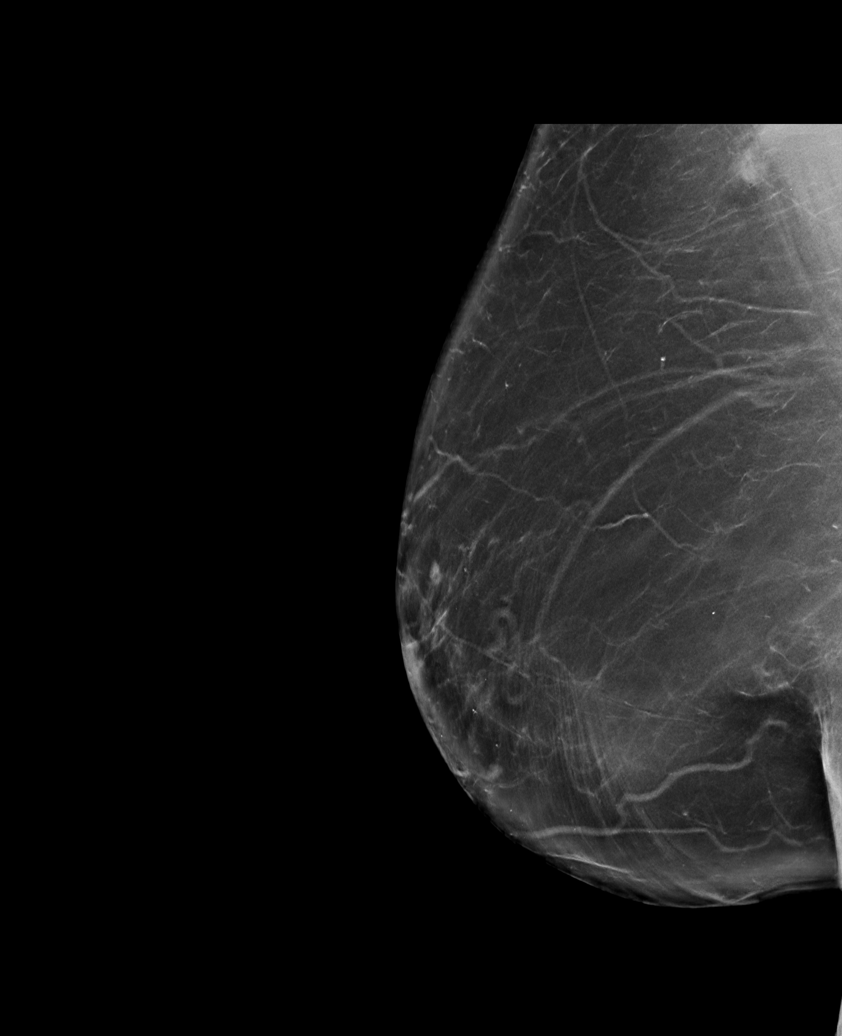

[R CC synth-2D]
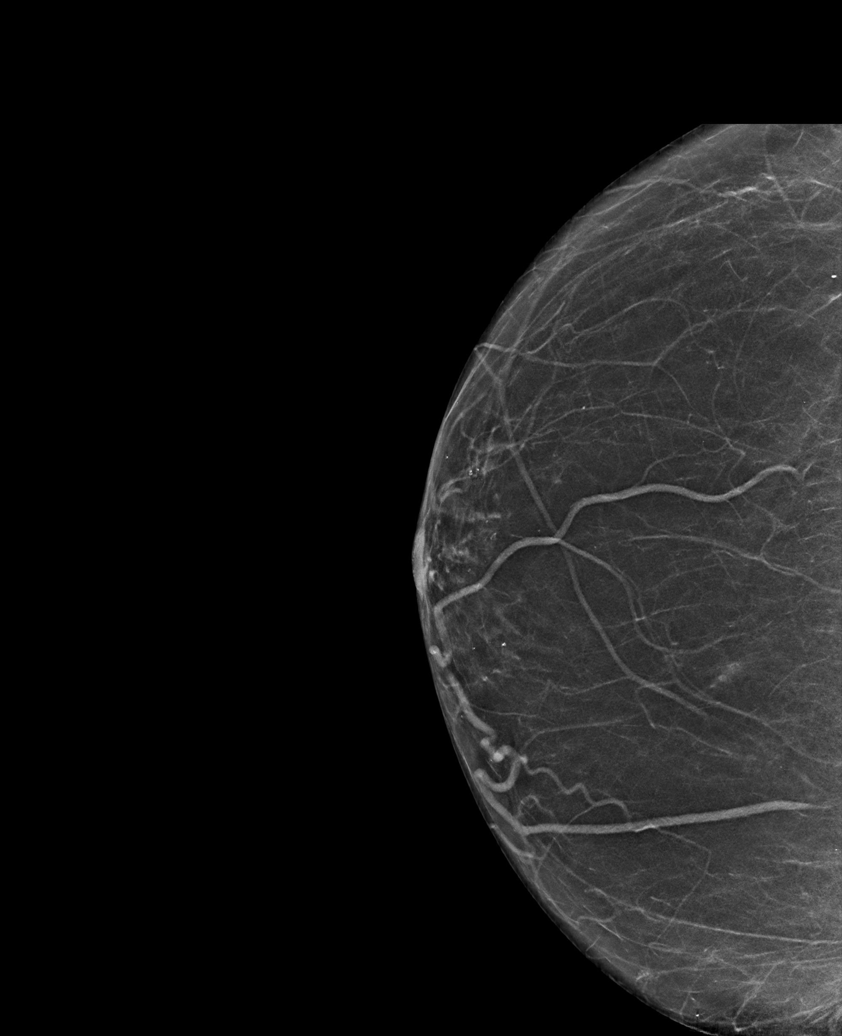

[L CC synth-2D]
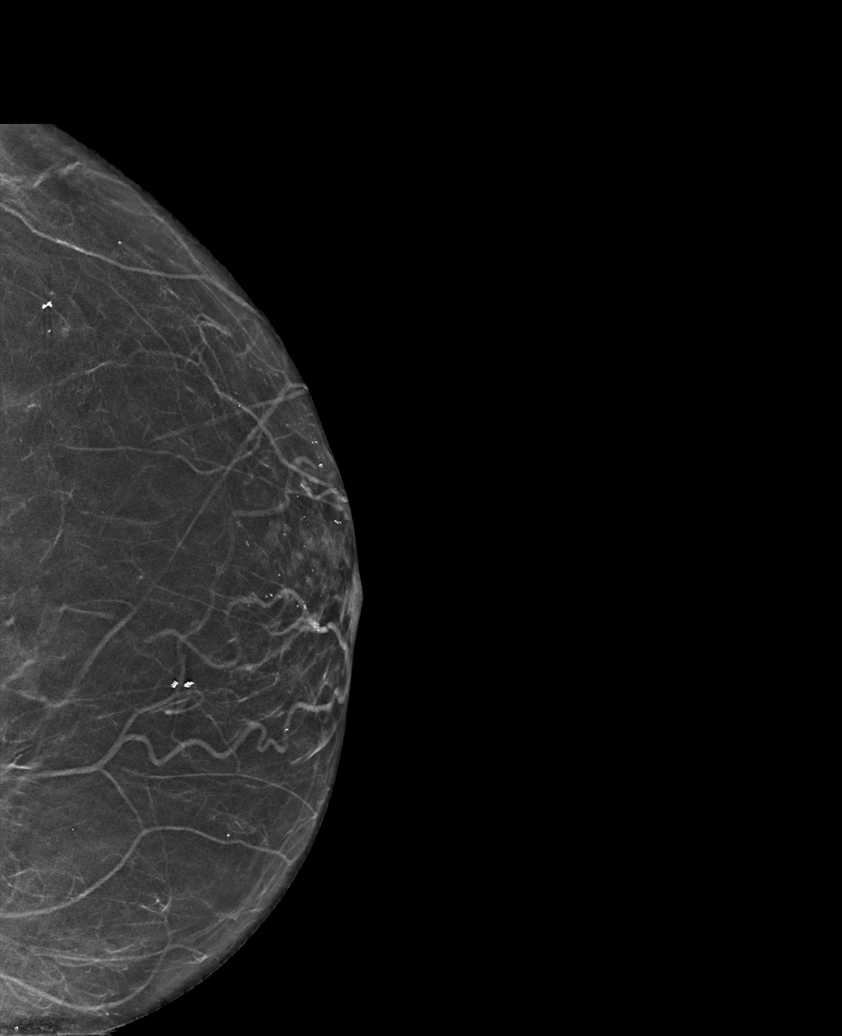

[L MLO synth-2D]
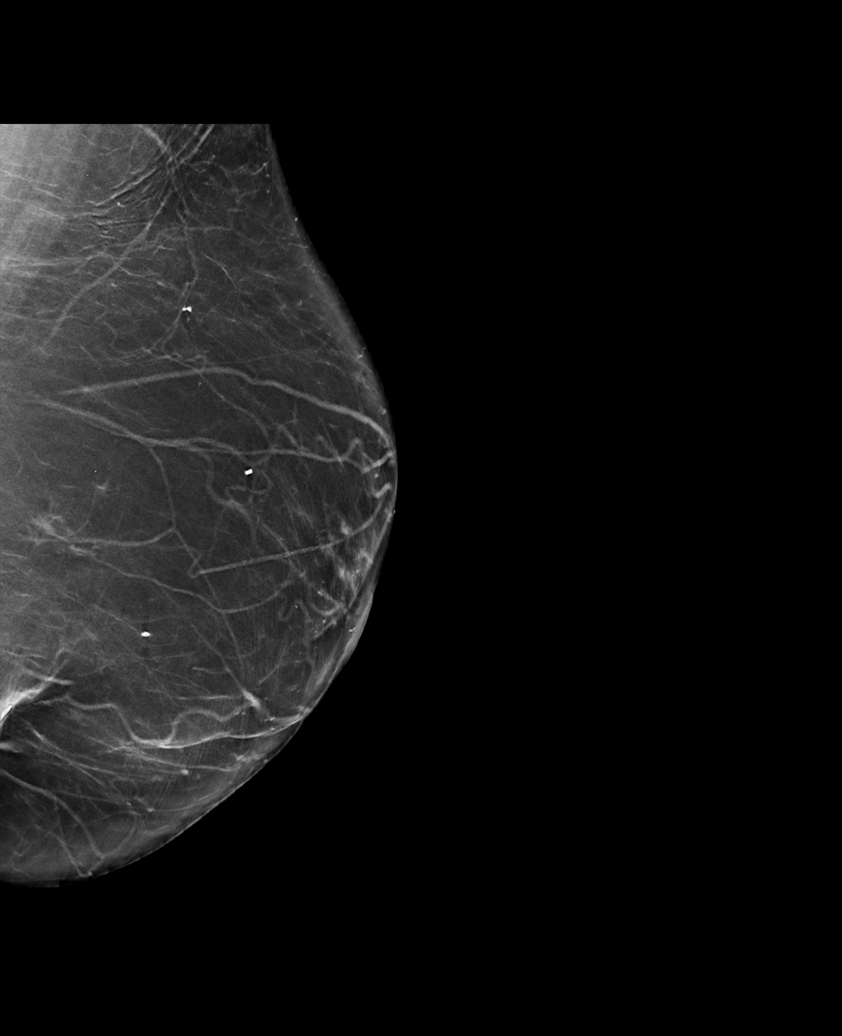

[L CC tomo · tomo slice 29/56.0]
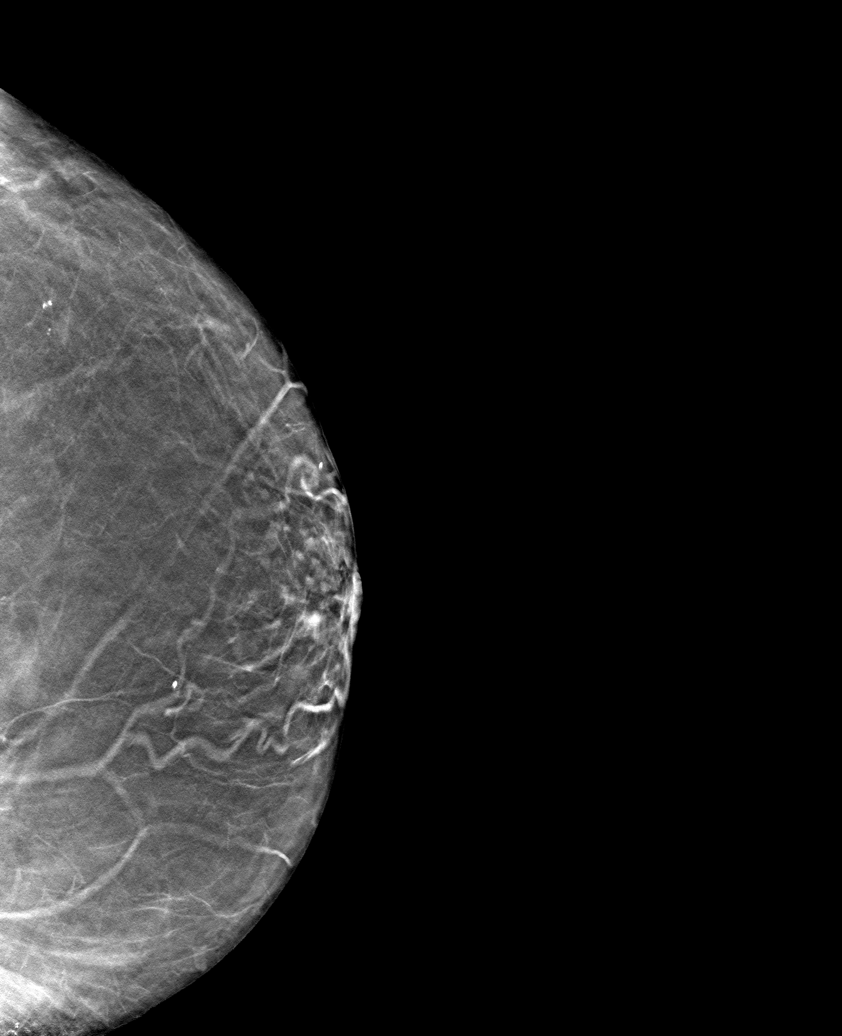

[6 of 30 positions shown; findings below may reference images not displayed]

FINDINGS: There are no findings suspicious for malignancy. Images were
processed with CAD.
IMPRESSION: No mammographic evidence of malignancy. A result letter of this
screening mammogram will be mailed directly to the patient.

RECOMMENDATION:
Screening mammogram in one year. (Code:[id number])

BI-RADS CATEGORY  1: Negative.

## 2021-07-05 ENCOUNTER — Ambulatory Visit (INDEPENDENT_AMBULATORY_CARE_PROVIDER_SITE_OTHER): Payer: Medicare HMO | Admitting: Family Medicine

## 2021-07-05 ENCOUNTER — Encounter: Payer: Self-pay | Admitting: Family Medicine

## 2021-07-05 ENCOUNTER — Other Ambulatory Visit: Payer: Self-pay | Admitting: Family Medicine

## 2021-07-05 VITALS — BP 152/63 | HR 67 | Resp 16 | Ht 59.0 in | Wt 166.0 lb

## 2021-07-05 DIAGNOSIS — I272 Pulmonary hypertension, unspecified: Secondary | ICD-10-CM

## 2021-07-05 DIAGNOSIS — Z6833 Body mass index (BMI) 33.0-33.9, adult: Secondary | ICD-10-CM

## 2021-07-05 DIAGNOSIS — E7800 Pure hypercholesterolemia, unspecified: Secondary | ICD-10-CM

## 2021-07-05 DIAGNOSIS — N644 Mastodynia: Secondary | ICD-10-CM | POA: Diagnosis not present

## 2021-07-05 DIAGNOSIS — F17218 Nicotine dependence, cigarettes, with other nicotine-induced disorders: Secondary | ICD-10-CM | POA: Diagnosis not present

## 2021-07-05 DIAGNOSIS — I700 Atherosclerosis of aorta: Secondary | ICD-10-CM

## 2021-07-05 DIAGNOSIS — M6530 Trigger finger, unspecified finger: Secondary | ICD-10-CM | POA: Diagnosis not present

## 2021-07-05 DIAGNOSIS — E039 Hypothyroidism, unspecified: Secondary | ICD-10-CM

## 2021-07-05 DIAGNOSIS — E669 Obesity, unspecified: Secondary | ICD-10-CM

## 2021-07-05 DIAGNOSIS — R296 Repeated falls: Secondary | ICD-10-CM

## 2021-07-05 DIAGNOSIS — J449 Chronic obstructive pulmonary disease, unspecified: Secondary | ICD-10-CM

## 2021-07-05 DIAGNOSIS — F060 Psychotic disorder with hallucinations due to known physiological condition: Secondary | ICD-10-CM

## 2021-07-05 DIAGNOSIS — Z1231 Encounter for screening mammogram for malignant neoplasm of breast: Secondary | ICD-10-CM | POA: Diagnosis not present

## 2021-07-05 DIAGNOSIS — I10 Essential (primary) hypertension: Secondary | ICD-10-CM | POA: Diagnosis not present

## 2021-07-05 DIAGNOSIS — E1143 Type 2 diabetes mellitus with diabetic autonomic (poly)neuropathy: Secondary | ICD-10-CM

## 2021-07-05 DIAGNOSIS — E662 Morbid (severe) obesity with alveolar hypoventilation: Secondary | ICD-10-CM

## 2021-07-05 DIAGNOSIS — F3170 Bipolar disorder, currently in remission, most recent episode unspecified: Secondary | ICD-10-CM

## 2021-07-05 MED ORDER — SPIRONOLACTONE 50 MG PO TABS: 50.0000 mg | ORAL_TABLET | Freq: Every day | ORAL | 1 refills | Status: DC

## 2021-07-05 NOTE — Patient Instructions (Addendum)
Annual exam if due in 8 weeks, re eval BP at visit, if not due then routine follow up in 8 weeks, call if you need me soooner ? ?Please schedule diagnostic mammogram for left breast pain which is new ? ?Higher dose spironolactone is 50 mg daily for uncontrolled blood pressure ? ?You will be referred to orthopedics for trigger finger ? ?Careful , no falls ? ?Thanks for choosing [facility], we consider it a privelige to serve you. ? ?

## 2021-07-07 ENCOUNTER — Ambulatory Visit (INDEPENDENT_AMBULATORY_CARE_PROVIDER_SITE_OTHER): Payer: Medicare HMO | Admitting: Psychiatry

## 2021-07-07 ENCOUNTER — Inpatient Hospital Stay (HOSPITAL_COMMUNITY): Payer: Medicare HMO | Attending: Hematology

## 2021-07-07 ENCOUNTER — Other Ambulatory Visit: Payer: Self-pay | Admitting: Internal Medicine

## 2021-07-07 DIAGNOSIS — D509 Iron deficiency anemia, unspecified: Secondary | ICD-10-CM | POA: Insufficient documentation

## 2021-07-07 DIAGNOSIS — E538 Deficiency of other specified B group vitamins: Secondary | ICD-10-CM | POA: Diagnosis not present

## 2021-07-07 DIAGNOSIS — N183 Chronic kidney disease, stage 3 unspecified: Secondary | ICD-10-CM

## 2021-07-07 DIAGNOSIS — F331 Major depressive disorder, recurrent, moderate: Secondary | ICD-10-CM

## 2021-07-07 DIAGNOSIS — Z79899 Other long term (current) drug therapy: Secondary | ICD-10-CM | POA: Insufficient documentation

## 2021-07-07 DIAGNOSIS — E039 Hypothyroidism, unspecified: Secondary | ICD-10-CM

## 2021-07-07 LAB — CBC WITH DIFFERENTIAL/PLATELET
Abs Immature Granulocytes: 0.06 10*3/uL (ref 0.00–0.07)
Basophils Absolute: 0 10*3/uL (ref 0.0–0.1)
Basophils Relative: 1 %
Eosinophils Absolute: 0.1 10*3/uL (ref 0.0–0.5)
Eosinophils Relative: 2 %
HCT: 32.9 % — ABNORMAL LOW (ref 36.0–46.0)
Hemoglobin: 9.4 g/dL — ABNORMAL LOW (ref 12.0–15.0)
Immature Granulocytes: 1 %
Lymphocytes Relative: 33 %
Lymphs Abs: 1.9 10*3/uL (ref 0.7–4.0)
MCH: 22.6 pg — ABNORMAL LOW (ref 26.0–34.0)
MCHC: 28.6 g/dL — ABNORMAL LOW (ref 30.0–36.0)
MCV: 79.1 fL — ABNORMAL LOW (ref 80.0–100.0)
Monocytes Absolute: 0.5 10*3/uL (ref 0.1–1.0)
Monocytes Relative: 9 %
Neutro Abs: 3.2 10*3/uL (ref 1.7–7.7)
Neutrophils Relative %: 54 %
Platelets: 225 10*3/uL (ref 150–400)
RBC: 4.16 MIL/uL (ref 3.87–5.11)
RDW: 14.8 % (ref 11.5–15.5)
WBC: 5.8 10*3/uL (ref 4.0–10.5)
nRBC: 0 % (ref 0.0–0.2)

## 2021-07-07 LAB — IRON AND TIBC
Iron: 52 ug/dL (ref 28–170)
Saturation Ratios: 16 % (ref 10.4–31.8)
TIBC: 323 ug/dL (ref 250–450)
UIBC: 271 ug/dL

## 2021-07-07 LAB — COMPREHENSIVE METABOLIC PANEL
ALT: 24 U/L (ref 0–44)
AST: 25 U/L (ref 15–41)
Albumin: 3.9 g/dL (ref 3.5–5.0)
Alkaline Phosphatase: 65 U/L (ref 38–126)
Anion gap: 5 (ref 5–15)
BUN: 23 mg/dL (ref 8–23)
CO2: 27 mmol/L (ref 22–32)
Calcium: 9.1 mg/dL (ref 8.9–10.3)
Chloride: 111 mmol/L (ref 98–111)
Creatinine, Ser: 1.26 mg/dL — ABNORMAL HIGH (ref 0.44–1.00)
GFR, Estimated: 46 mL/min — ABNORMAL LOW (ref >60)
Glucose, Bld: 66 mg/dL — ABNORMAL LOW (ref 70–99)
Potassium: 3.9 mmol/L (ref 3.5–5.1)
Sodium: 143 mmol/L (ref 135–145)
Total Bilirubin: 0.4 mg/dL (ref 0.3–1.2)
Total Protein: 6.8 g/dL (ref 6.5–8.1)

## 2021-07-07 LAB — VITAMIN B12: Vitamin B-12: 1133 pg/mL — ABNORMAL HIGH (ref 180–914)

## 2021-07-07 LAB — LACTATE DEHYDROGENASE: LDH: 191 U/L (ref 98–192)

## 2021-07-07 LAB — FERRITIN: Ferritin: 256 ng/mL (ref 11–307)

## 2021-07-07 LAB — FOLATE: Folate: 28.3 ng/mL (ref >5.9)

## 2021-07-07 NOTE — Progress Notes (Signed)
Virtual Visit via Telephone Note ? ?I connected with [name] on 07/07/21 at 4:03 PM EDT  by telephone and verified that I am speaking with the correct person using two identifiers. ? ?Location: ?Patient: Home ?Provider: [facility] [place] office  ?  ?I discussed the limitations, risks, security and privacy concerns of performing an evaluation and management service by telephone and the availability of in person appointments. I also discussed with the patient that there may be a patient responsible charge related to this service. The patient expressed understanding and agreed to proceed. ? ? ? ?I provided 45 minutes of non-face-to-face time during this encounter. ? ? ?Hailley Byers E Jami Ohlin, LCSW ? ?   ?        ?THERAPIST PROGRESS NOTE   ? ?Session Time: Thursday  07/07/2021 4:03 PM - 4:48 PM  ? ?Participation Level: Active ? ?Behavioral Response: depressed, talkative, alert  ? ?Type of Therapy: Individual Therapy ? ?Treatment Goals addressed: Patient will score less than 10 on the patient health questionnaire ? ?Progress on goals: Progressing ? ?Interventions: CBT and Supportive ?            ?Summary: [name] is a 71 y.o. female who presents with  long standing history of recurrent periods  of depression beginning when she was 33 and her favorite uncle died. Patient reports multiple psychiatric hospitlaizations due to depression and suicidal ideations with the last one occuring in 1997. Patient has participated in outpatient psychotherapy and medication management intermittently since age 74.  She currently is seeing psychiatrist Dr. Harrington Challenger . Prior to this, she was seen at [facility]. Patient also has had ECT at [facility]. Symptoms have worsened in recent months due to family stress and have  included depressed mood, anxiety, excessive worry, and tearfulness.        ? ?Patient's last contact was by virtual visit via telephone about 4 weeks ago.   She states having ups and downs but managing.  She reports  trying to use involvement in doing puzzles and listening to [place] programming to cope. She continues to attend church.  However, she expresses increased frustration as she reports not socializing with members after services  like she has in the past.  She also expresses frustration some of her friends are not contacting her as they have in the past.  She continues to express frustration regarding relationship with her son as well as the relationship between her son and her grandson.  She expresses sadness grandson has not contacted her in 2 months despite her repeated calls and messages.   ? ?   Suicidal/Homicidal: Nowithout intent/plan ?     ?Therapist Response: Reviewed symptoms, discussed stressors, facilitated expression of thoughts and feelings, validated feelings, assisted patient examine her pattern of interaction with others as well as her expectations, reviewed assertive communication versus aggressive communication, assisted patient identify and challenge unhelpful thoughts about interaction with others, assisted patient identify realistic expectations regarding son and grandson, ? ?Diagnosis: Axis I: MDD, Recurrent, moderate ? ?  Axis II: Deferred ?Collaboration of Care: Psychiatrist AEB patient working with psychiatrist Dr. Harrington Challenger ? ?Patient/Guardian was advised Release of Information must be obtained prior to any record release in order to collaborate their care with an outside provider. Patient/Guardian was advised if they have not already done so to contact the registration department to sign all necessary forms in order for [name] to release information regarding their care.  ? ?Consent: Patient/Guardian gives verbal consent for treatment and  assignment of benefits for services provided during this visit. Patient/Guardian expressed understanding and agreed to proceed.  ? ? ?Makynlee Kressin E Jerusha Reising, LCSW ? ? ?

## 2021-07-08 LAB — COPPER, SERUM: Copper: 129 ug/dL (ref 80–158)

## 2021-07-08 LAB — HOMOCYSTEINE: Homocysteine: 12.8 umol/L (ref 0.0–19.2)

## 2021-07-10 ENCOUNTER — Encounter: Payer: Self-pay | Admitting: Family Medicine

## 2021-07-10 DIAGNOSIS — N644 Mastodynia: Secondary | ICD-10-CM | POA: Insufficient documentation

## 2021-07-10 DIAGNOSIS — T40601A Poisoning by unspecified narcotics, accidental (unintentional), initial encounter: Secondary | ICD-10-CM | POA: Insufficient documentation

## 2021-07-10 DIAGNOSIS — I700 Atherosclerosis of aorta: Secondary | ICD-10-CM | POA: Insufficient documentation

## 2021-07-10 DIAGNOSIS — I69344 Monoplegia of lower limb following cerebral infarction affecting left non-dominant side: Secondary | ICD-10-CM | POA: Insufficient documentation

## 2021-07-10 NOTE — Assessment & Plan Note (Signed)
Stable treated by Psych ?

## 2021-07-10 NOTE — Assessment & Plan Note (Signed)
Symptomatic and accompanied by hand weakness refer Ortho ?

## 2021-07-10 NOTE — Assessment & Plan Note (Signed)
Controlled, no change in medication  

## 2021-07-10 NOTE — Assessment & Plan Note (Signed)
Home safety and fall risk reduction discussed 

## 2021-07-10 NOTE — Assessment & Plan Note (Signed)
?  Patient re-educated about  the importance of commitment to a  minimum of 150 minutes of exercise per week as able. ? ?The importance of healthy food choices with portion control discussed, as well as eating regularly and within a 12 hour window most days. ?The need to choose "clean , green" food 50 to 75% of the time is discussed, as well as to make water the primary drink and set a goal of 64 ounces water daily. ? ?  ? ?  07/05/2021  ?  1:56 PM 06/29/2021  ?  9:04 AM 06/09/2021  ? 11:52 AM  ?Weight /BMI  ?Weight 166 lb 169 lb 165 lb 6.4 oz  ?Height '4\' 11"'$  (1.499 m) '4\' 11"'$  (1.499 m) '4\' 11"'$  (1.499 m)  ?BMI 33.53 kg/m2 34.13 kg/m2 33.41 kg/m2  ? ? ? ?

## 2021-07-10 NOTE — Assessment & Plan Note (Signed)
Hyperlipidemia:Low fat diet discussed and encouraged. ? ? ?Lipid Panel  ?Lab Results  ?Component Value Date  ? CHOL 155 05/10/2021  ? HDL 42 05/10/2021  ? [place] 79 05/10/2021  ? TRIG 200 (H) 05/10/2021  ? CHOLHDL 3.7 05/10/2021  ? ?May need to add fibrate, needs to lower fat intake ? ? ?

## 2021-07-10 NOTE — Assessment & Plan Note (Signed)
Uncontrolled, inc spironolactone and re eval in 8 weeks ?DASH diet and commitment to daily physical activity for a minimum of 30 minutes discussed and encouraged, as a part of hypertension management. ?The importance of attaining a healthy weight is also discussed. ? ? ?  07/05/2021  ?  1:56 PM 06/29/2021  ?  9:04 AM 06/09/2021  ? 11:52 AM 05/26/2021  ? 10:26 AM 05/19/2021  ?  2:50 PM 05/17/2021  ? 10:04 AM 05/10/2021  ? 10:51 AM  ?BP/Weight  ?Systolic BP 056 979 480 165 120 116 119  ?Diastolic BP 63 60 60 61 80 60 68  ?Wt. (Lbs) 166 169 165.4 170.1 167.6 167.4 170  ?BMI 33.53 kg/m2 34.13 kg/m2 33.41 kg/m2 34.36 kg/m2 33.85 kg/m2 33.81 kg/m2 34.34 kg/m2  ? ? ? ? ?

## 2021-07-10 NOTE — Assessment & Plan Note (Signed)
Stable n/oexcessive fatigue or dyspnea reported ?

## 2021-07-10 NOTE — Assessment & Plan Note (Signed)
New onset left breat pain with no inciting incident, needs diagnostic mammogram ?

## 2021-07-10 NOTE — Assessment & Plan Note (Signed)
Asked:confirms currently smokes cigarettes °Assess: Unwilling to set a quit date, but is cutting back °Advise: needs to QUIT to reduce risk of cancer, cardio and cerebrovascular disease °Assist: counseled for 5 minutes and literature provided °Arrange: follow up in 2 to 4 months ° °

## 2021-07-10 NOTE — Progress Notes (Signed)
? ?[name]     MRN: [id number]      DOB: 1950/03/30 ? ? ?HPI ?[name] is here for follow up and re-evaluation of chronic medical conditions, medication management and review of any available recent lab and radiology data.  ?Preventive health is updated, specifically  Cancer screening and Immunization.   ?Questions or concerns regarding consultations or procedures which the PT has had in the interim are  addressed. ?The PT denies any adverse reactions to current medications since the last visit.  ?C/o left breast pain with indirect pressure over past 2 to 3 weeks, no inciting trauma ?C/o finger locking and hand weakness ? ?ROS ?Denies recent fever or chills. ?Denies sinus pressure, nasal congestion, ear pain or sore throat. ?Denies chest congestion, productive cough or wheezing. ?Denies chest pains, palpitations and leg swelling ?Denies abdominal pain, nausea, vomiting,diarrhea or constipation.   ?Denies dysuria, frequency, hesitancy or incontinence. ?. ?Denies headaches, seizures, numbness, or tingling. ?Denies uncontrolled depression, anxiety or insomnia. ?Denies skin break down or rash. ? ? ?PE ? ?BP (!) 152/63   Pulse 67   Resp 16   Ht '4\' 11"'$  (1.499 m)   Wt 166 lb (75.3 kg)   SpO2 93%   BMI 33.53 kg/m?  ? ?Patient alert and oriented and in no cardiopulmonary distress. ? ?HEENT: No facial asymmetry, EOMI,     Neck supple . ? ?Chest: Clear to auscultation bilaterally. ? ?CVS: S1, S2 no murmurs, no S3.Regular rate. ? ?ABD: Soft non tender.  ? ?Ext: No edema ? ?MS: decreased  ROM spine, shoulders, hips and knees.Trigger finger ? ?Skin: Intact, no ulcerations or rash noted. ? ?Psych: Good eye contact, normal affect. Memory intact not anxious or depressed appearing. ? ?CNS: CN 2-12 intact, power,  normal throughout.no focal deficits noted. ? ? ?Assessment & Plan ? ?Essential hypertension ?Uncontrolled, inc spironolactone and re eval in 8 weeks ?DASH diet and commitment to daily physical activity for a  minimum of 30 minutes discussed and encouraged, as a part of hypertension management. ?The importance of attaining a healthy weight is also discussed. ? ? ?  07/05/2021  ?  1:56 PM 06/29/2021  ?  9:04 AM 06/09/2021  ? 11:52 AM 05/26/2021  ? 10:26 AM 05/19/2021  ?  2:50 PM 05/17/2021  ? 10:04 AM 05/10/2021  ? 10:51 AM  ?BP/Weight  ?Systolic BP 323 557 322 025 120 116 119  ?Diastolic BP 63 60 60 61 80 60 68  ?Wt. (Lbs) 166 169 165.4 170.1 167.6 167.4 170  ?BMI 33.53 kg/m2 34.13 kg/m2 33.41 kg/m2 34.36 kg/m2 33.85 kg/m2 33.81 kg/m2 34.34 kg/m2  ? ? ? ? ? ?Breast pain, left ?New onset left breat pain with no inciting incident, needs diagnostic mammogram ? ?TRIGGER FINGER ?Symptomatic and accompanied by hand weakness refer Ortho ? ?Well controlled type 2 diabetes mellitus with gastroparesis ([place]) ?[name] is reminded of the importance of commitment to daily physical activity for 30 minutes or more, as able and the need to limit carbohydrate intake to 30 to 60 grams per meal to help with blood sugar control.  ? ?The need to take medication as prescribed, test blood sugar as directed, and to call between visits if there is a concern that blood sugar is uncontrolled is also discussed.  ? ?[name] is reminded of the importance of daily foot exam, annual eye examination, and good blood sugar, blood pressure and cholesterol control. ? ? ?  Latest Ref Rng & Units 07/07/2021  ?  12:04 PM 05/20/2021  ?  5:27 PM 05/10/2021  ? 11:37 AM 01/17/2021  ? 12:06 PM 12/23/2020  ? 12:44 PM  ?Diabetic Labs  ?HbA1c 4.0 - 5.6 %  5.9    5.9     ?Chol 100 - 199 mg/dL   155      ?HDL >39 mg/dL   42      ?Calc LDL 0 - 99 mg/dL   79      ?Triglycerides 0 - 149 mg/dL   200      ?Creatinine 0.44 - 1.00 mg/dL 1.26    1.13    1.23    ? ? ?  07/05/2021  ?  1:56 PM 06/29/2021  ?  9:04 AM 06/09/2021  ? 11:52 AM 05/26/2021  ? 10:26 AM 05/19/2021  ?  2:50 PM 05/17/2021  ? 10:04 AM 05/10/2021  ? 10:51 AM  ?BP/Weight  ?Systolic BP 353 614 431 540 120 116 119  ?Diastolic BP 63 60  60 61 80 60 68  ?Wt. (Lbs) 166 169 165.4 170.1 167.6 167.4 170  ?BMI 33.53 kg/m2 34.13 kg/m2 33.41 kg/m2 34.36 kg/m2 33.85 kg/m2 33.81 kg/m2 34.34 kg/m2  ? ? ?  Latest Ref Rng & Units 05/26/2020  ? 12:00 AM 01/01/2019  ?  1:40 PM  ?Foot/eye exam completion dates  ?Eye Exam No Retinopathy No Retinopathy        ?Foot Form Completion   Done  ?  ? This result is from an external source.  ? ? ? ? ?Mnagaed by endo ? ?Recurrent falls ?Home safety and fall risk reduction discussed ? 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 ?Pulmonary hypertension ([place]) ?Stable n/oexcessive fatigue or dyspnea reported ? ?Hyperlipidemia ?Hyperlipidemia:Low fat diet discussed and encouraged. ? ? ?Lipid Panel  ?Lab Results  ?Component Value Date  ? CHOL 155 05/10/2021  ? HDL 42 05/10/2021  ? [place] 79 05/10/2021  ? TRIG 200 (H) 05/10/2021  ? CHOLHDL 3.7 05/10/2021  ? ?May need to add fibrate, needs to lower fat intake ? ? ? ?Hypothyroidism ?Controlled, no change in medication ? ? ?Obesity (BMI 30.0-34.9) ? ?Patient re-educated about  the importance of commitment to a  minimum of 150 minutes of exercise per week as able. ? ?The importance of healthy food choices with portion control discussed, as well as eating regularly and within a 12 hour window most days. ?The need to choose "clean , green" food 50 to 75% of the time is discussed, as well as to make water the primary drink and set a goal of 64 ounces water daily. ? ?  ? ?  07/05/2021  ?  1:56 PM 06/29/2021  ?  9:04 AM 06/09/2021  ? 11:52 AM  ?Weight /BMI  ?Weight 166 lb 169 lb 165 lb 6.4 oz  ?Height '4\' 11"'$  (1.499 m) '4\' 11"'$  (1.499 m) '4\' 11"'$  (1.499 m)  ?BMI 33.53 kg/m2 34.13 kg/m2 33.41 kg/m2  ? ? ? ? ?Nicotine dependence ?Asked:confirms currently smokes cigarettes ?Assess: Unwilling to set a quit date, but is cutting back ?Advise: needs to QUIT to reduce risk of cancer, cardio and cerebrovascular disease ?Assist: counseled for 5 minutes and  literature provided ?Arrange: follow up in 2 to 4 months ? ? ?

## 2021-07-10 NOTE — Assessment & Plan Note (Signed)
[name] is reminded of the importance of commitment to daily physical activity for 30 minutes or more, as able and the need to limit carbohydrate intake to 30 to 60 grams per meal to help with blood sugar control.  ? ?The need to take medication as prescribed, test blood sugar as directed, and to call between visits if there is a concern that blood sugar is uncontrolled is also discussed.  ? ?[name] is reminded of the importance of daily foot exam, annual eye examination, and good blood sugar, blood pressure and cholesterol control. ? ? ?  Latest Ref Rng & Units 07/07/2021  ? 12:04 PM 05/20/2021  ?  5:27 PM 05/10/2021  ? 11:37 AM 01/17/2021  ? 12:06 PM 12/23/2020  ? 12:44 PM  ?Diabetic Labs  ?HbA1c 4.0 - 5.6 %  5.9    5.9     ?Chol 100 - 199 mg/dL   155      ?HDL >39 mg/dL   42      ?Calc LDL 0 - 99 mg/dL   79      ?Triglycerides 0 - 149 mg/dL   200      ?Creatinine 0.44 - 1.00 mg/dL 1.26    1.13    1.23    ? ? ?  07/05/2021  ?  1:56 PM 06/29/2021  ?  9:04 AM 06/09/2021  ? 11:52 AM 05/26/2021  ? 10:26 AM 05/19/2021  ?  2:50 PM 05/17/2021  ? 10:04 AM 05/10/2021  ? 10:51 AM  ?BP/Weight  ?Systolic BP 654 650 354 656 120 116 119  ?Diastolic BP 63 60 60 61 80 60 68  ?Wt. (Lbs) 166 169 165.4 170.1 167.6 167.4 170  ?BMI 33.53 kg/m2 34.13 kg/m2 33.41 kg/m2 34.36 kg/m2 33.85 kg/m2 33.81 kg/m2 34.34 kg/m2  ? ? ?  Latest Ref Rng & Units 05/26/2020  ? 12:00 AM 01/01/2019  ?  1:40 PM  ?Foot/eye exam completion dates  ?Eye Exam No Retinopathy No Retinopathy        ?Foot Form Completion   Done  ?  ? This result is from an external source.  ? ? ? ? ?Mnagaed by endo ?

## 2021-07-11 ENCOUNTER — Encounter: Payer: Self-pay | Admitting: Orthopedic Surgery

## 2021-07-11 ENCOUNTER — Ambulatory Visit (INDEPENDENT_AMBULATORY_CARE_PROVIDER_SITE_OTHER): Payer: Medicare HMO

## 2021-07-11 DIAGNOSIS — F250 Schizoaffective disorder, bipolar type: Secondary | ICD-10-CM | POA: Diagnosis not present

## 2021-07-11 DIAGNOSIS — E1142 Type 2 diabetes mellitus with diabetic polyneuropathy: Secondary | ICD-10-CM | POA: Diagnosis not present

## 2021-07-11 DIAGNOSIS — J42 Unspecified chronic bronchitis: Secondary | ICD-10-CM | POA: Diagnosis not present

## 2021-07-11 DIAGNOSIS — Z0000 Encounter for general adult medical examination without abnormal findings: Secondary | ICD-10-CM | POA: Diagnosis not present

## 2021-07-11 DIAGNOSIS — R2681 Unsteadiness on feet: Secondary | ICD-10-CM | POA: Diagnosis not present

## 2021-07-11 DIAGNOSIS — J449 Chronic obstructive pulmonary disease, unspecified: Secondary | ICD-10-CM | POA: Diagnosis not present

## 2021-07-11 DIAGNOSIS — Z79899 Other long term (current) drug therapy: Secondary | ICD-10-CM

## 2021-07-11 DIAGNOSIS — Z7984 Long term (current) use of oral hypoglycemic drugs: Secondary | ICD-10-CM | POA: Diagnosis not present

## 2021-07-11 DIAGNOSIS — Z794 Long term (current) use of insulin: Secondary | ICD-10-CM | POA: Diagnosis not present

## 2021-07-11 DIAGNOSIS — F17210 Nicotine dependence, cigarettes, uncomplicated: Secondary | ICD-10-CM | POA: Diagnosis not present

## 2021-07-11 DIAGNOSIS — M160 Bilateral primary osteoarthritis of hip: Secondary | ICD-10-CM | POA: Diagnosis not present

## 2021-07-11 LAB — METHYLMALONIC ACID, SERUM: Methylmalonic Acid, Quantitative: 200 nmol/L (ref 0–378)

## 2021-07-11 NOTE — Patient Instructions (Signed)
?  [name] , ?Thank you for taking time to come for your Medicare Wellness Visit. I appreciate your ongoing commitment to your health goals. Please review the following plan we discussed and let me know if I can assist you in the future.  ? ?Referral entered for care management services to help with medications and confusion with appts  ? ?These are the goals we discussed: ? Goals   ? ?  Exercise 3x per week (30 min per time)   ?  Recommend starting chair exercises at least 3 days a week for 30-45 minutes at a time as tolerated.  ? ? ?  ?  Medication Management   ?  Patient Goals/Self-Care Activities ?Patient will:  ?Focus on medication adherence by keeping up with prescription refills and either using a pill box or reminders to take your medications at the prescribed times ?Check blood sugar four times a day at the following times: fasting (at least 8 hours since last food consumption), 1-2 hours after all meals, and whenever patient experiences symptoms of hypo/hyperglycemia, document, and provide at future appointments ?Follow-up with all specialists as scheduled ? ? ?  ?  Prevent falls   ?  Prevent falls  ?  ?  Quit smoking / using tobacco   ?  Doing the best she can at cutting back ?  ? ?  ?  ?This is a list of the screening recommended for you and due dates:  ?Health Maintenance  ?Topic Date Due  ? COVID-19 Vaccine (4 - Booster for Moderna series) 04/12/2020  ? Eye exam for diabetics  05/26/2021  ? Flu Shot  10/11/2021  ? Hemoglobin A1C  11/20/2021  ? Tetanus Vaccine  12/28/2021  ? Complete foot exam   02/16/2022  ? Mammogram  12/15/2022  ? Pneumonia Vaccine  Completed  ? DEXA scan (bone density measurement)  Completed  ? Hepatitis C Screening: USPSTF Recommendation to screen - Ages 65-79 yo.  Completed  ? Zoster (Shingles) Vaccine  Completed  ? HPV Vaccine  Aged Out  ? Colon Cancer Screening  Discontinued  ?  ?

## 2021-07-11 NOTE — Progress Notes (Signed)
I connected with  [name] on 07/11/21 by a audio enabled telemedicine application and verified that I am speaking with the correct person using two identifiers.  Patient Location: Home  Provider Location: Home Office  I discussed the limitations of evaluation and management by telemedicine. The patient expressed understanding and agreed to proceed.  Subjective:   [name] is a 71 y.o. female who presents for Medicare Annual (Subsequent) preventive examination.  Review of Systems     Cardiac Risk Factors include: diabetes mellitus;advanced age (>43men, >53 women);smoking/ tobacco exposure;dyslipidemia;hypertension     Objective:    Today's Vitals   07/11/21 1413  PainSc: 5    There is no height or weight on file to calculate BMI.     07/11/2021   12:53 PM 05/09/2021    1:59 PM 12/30/2020    1:19 PM 11/24/2020   12:37 PM 09/22/2020   11:21 AM 05/11/2020    3:22 PM 04/27/2020   12:10 PM  Advanced Directives  Does Patient Have a Medical Advance Directive? Yes Yes Yes Yes Yes Yes No  Type of Special educational needs teacher of [place];Living will Healthcare Power of [place];Living will Healthcare Power of [place];Living will Living will Healthcare Power of Attorney   Does patient want to make changes to medical advance directive? No - Patient declined No - Patient declined No - Patient declined  No - Patient declined No - Patient declined   Copy of Healthcare Power of Attorney in Chart?  No - copy requested No - copy requested   No - copy requested   Would patient like information on creating a medical advance directive?      No - Patient declined     Current Medications (verified) Outpatient Encounter Medications as of 07/11/2021  Medication Sig   ACCU-CHEK GUIDE test strip TEST BLOOD SUGAR FOUR TIMES DAILY  AND AS NEEDED   Accu-Chek Softclix Lancets lancets TEST BLOOD SUGAR THREE TIMES DAILY AS DIRECTED   albuterol (VENTOLIN HFA) 108 (90 Base) MCG/ACT inhaler  INHALE 1 TO 2 PUFFS EVERY 6 HOURS AS NEEDED FOR WHEEZING, SHORTNESS OF BREATH   Alcohol Swabs (DROPSAFE ALCOHOL PREP) 70 % PADS USE TO CLEAN FINGER PRIOR TO TESTING FOR BLOOD SUGAR  AS DIRECTED   alendronate (FOSAMAX) 70 MG tablet TAKE 1 TABLET (70 MG TOTAL) BY MOUTH EVERY 7 (SEVEN) DAYS. TAKE WITH A FULL GLASS OF WATER ON AN EMPTY STOMACH.   amLODipine (NORVASC) 10 MG tablet TAKE 1 TABLET EVERY DAY   ascorbic acid (VITAMIN C) 500 MG tablet Take 500 mg by mouth daily.   aspirin EC 81 MG tablet Take 1 tablet (81 mg total) by mouth daily with breakfast.   betamethasone dipropionate 0.05 % cream    Blood Glucose Calibration (ACCU-CHEK GUIDE CONTROL) LIQD USE AS DIRECTED   blood glucose meter kit and supplies Dispense based on patient and insurance preference. Use up to four times daily as directed. (FOR ICD-10 E10.9, E11.9).   buPROPion (WELLBUTRIN XL) 150 MG 24 hr tablet Take 1 tablet (150 mg total) by mouth every morning.   Calcium Carb-Cholecalciferol (CALTRATE 600+D3 SOFT) 600-20 MG-MCG CHEW Chew 1 tablet by mouth daily at 12 noon.   cetirizine (ZYRTEC) 10 MG tablet Take 1 tablet (10 mg total) by mouth daily.   Cholecalciferol 50 MCG (2000 UT) TABS Take 1 tablet by mouth daily.   Clobetasol Prop Emollient Base (CLOBETASOL PROPIONATE E) 0.05 % emollient cream Apply to affected area qd  Continuous Blood Gluc Sensor (FREESTYLE LIBRE 14 DAY SENSOR) MISC 1 each by Does not apply route every 14 (fourteen) days. Change every 2 weeks   diclofenac Sodium (VOLTAREN) 1 % GEL APPLY 2 GRAMS TOPICALLY FOUR TIMES DAILY.   Docusate Sodium (DSS) 100 MG CAPS Take by mouth.   DROPLET PEN NEEDLES 31G X 8 MM MISC USE FOR INJECTING INSULIN 4 TIMES DAILY.   ezetimibe (ZETIA) 10 MG tablet TAKE 1 TABLET EVERY DAY   famotidine (PEPCID) 40 MG tablet Take 40 mg by mouth 2 (two) times daily.   hydroxychloroquine (PLAQUENIL) 200 MG tablet Take by mouth.   hydrOXYzine (ATARAX/VISTARIL) 10 MG tablet Take 1 tablet (10 mg  total) by mouth 3 (three) times daily as needed.   insulin glargine, 2 Unit Dial, (TOUJEO MAX SOLOSTAR) 300 UNIT/ML Solostar Pen Inject 32 Units into the skin at bedtime.   JARDIANCE 10 MG TABS tablet Take 10 mg by mouth every morning.   lamoTRIgine (LAMICTAL) 100 MG tablet Take 1 tablet (100 mg total) by mouth 2 (two) times daily.   levothyroxine (SYNTHROID) 50 MCG tablet TAKE 1 TABLET EVERY DAY FOR 6 DAYS PER WEEK, 1/2 TABLET 1 DAY PER WEEK   LORazepam (ATIVAN) 0.5 MG tablet Take 1 tablet (0.5 mg total) by mouth 2 (two) times daily.   losartan (COZAAR) 50 MG tablet TAKE 1 TABLET EVERY DAY (Patient taking differently: Take 100 mg by mouth.)   meclizine (ANTIVERT) 25 MG tablet Take 1 tablet (25 mg total) by mouth 3 (three) times daily as needed for dizziness.   methocarbamol (ROBAXIN) 500 MG tablet Take one tablet by mouth two times daily , as needed, for neck and back pain and spasm (Patient taking differently: Take 500 mg by mouth 2 (two) times daily as needed for muscle spasms (for neck and back pain and spasm).)   metoprolol tartrate (LOPRESSOR) 50 MG tablet TAKE 1 TABLET TWICE DAILY (NEED MD APPOINTMENT) (Patient taking differently: Take 50 mg by mouth 2 (two) times daily.)   metroNIDAZOLE (FLAGYL) 250 MG tablet Take 1 tablet (250 mg total) by mouth 3 (three) times daily.   mirabegron ER (MYRBETRIQ) 25 MG TB24 tablet Take 1 tablet (25 mg total) by mouth daily.   Misc. Devices (MATTRESS PAD) MISC FIRM MATTRESS PAD for hospital bed x 1   montelukast (SINGULAIR) 10 MG tablet TAKE 1 TABLET EVERY DAY   Multiple Vitamins-Minerals (MULTIVITAMIN GUMMIES ADULT PO) Take 1 tablet by mouth daily.   Nicotine (NICOTROL NS) 10 MG/ML SOLN 1-5x/ hr x 8 weeks, then taper over 4-6 weeks to dc   NOVOLOG FLEXPEN 100 UNIT/ML FlexPen INJECT 16 TO 18 UNITS UNDER SKIN UP TO 3 TIMES A DAY BEFORE MEALS  (DISCARD PEN 28 DAYS AFTER OPENING)   nystatin (MYCOSTATIN/NYSTOP) powder APPLY TO AFFECTED AREA 4 TIMES DAILY.    Omega-3 Fatty Acids (FISH OIL PO) Take 1 capsule by mouth daily.   oxyCODONE-acetaminophen (PERCOCET) 10-325 MG tablet Take 1 tablet by mouth every 8 (eight) hours as needed for pain.   Plecanatide (TRULANCE) 3 MG TABS Take 3 mg by mouth daily as needed.   pregabalin (LYRICA) 75 MG capsule Take 1 capsule (75 mg total) by mouth daily.   RABEprazole (ACIPHEX) 20 MG tablet Take 1 tablet (20 mg total) by mouth 2 (two) times daily.   RESTASIS 0.05 % ophthalmic emulsion Place 2 drops into both eyes daily.   risperiDONE (RISPERDAL) 0.5 MG tablet Take 1 tablet (0.5 mg total) by mouth at bedtime.  sertraline (ZOLOFT) 100 MG tablet Take 1 tablet (100 mg total) by mouth daily.   spironolactone (ALDACTONE) 50 MG tablet Take 1 tablet (50 mg total) by mouth daily.   STIOLTO RESPIMAT 2.5-2.5 MCG/ACT AERS INHALE 2 PUFFS INTO THE LUNGS DAILY. (Patient taking differently: Inhale 2 puffs into the lungs daily.)   traZODone (DESYREL) 150 MG tablet Take 1 tablet (150 mg total) by mouth at bedtime.   UNABLE TO FIND Hospital bed mattress x 1  DX: G47.33, J44.9   UNABLE TO FIND Standard wheelchair Dx M47.16, R29.898   UNABLE TO FIND Med Name: Activice cooling gel spray on arms, legs, and back   UNABLE TO [facility] bed with orthopedic mattress x 1 DX G47.33, G89.4   No facility-administered encounter medications on file as of 07/11/2021.    Allergies (verified) Iron, Lac bovis, Penicillins, Phenazopyridine, Phenazopyridine hcl, Cephalexin, Flonase [fluticasone], Milk-related compounds, and Phenazopyridine hcl   History: Past Medical History:  Diagnosis Date   Allergy    Anemia    Anxiety    takes Ativan daily   Arthritis    Assistance needed for mobility    Bipolar disorder (HCC)    takes Risperdal nightly   Blood transfusion    Brain tumor (HCC)    Cancer (HCC)    In her gum   Carpal tunnel syndrome of right wrist 05/23/2011   Cervical disc disorder with radiculopathy of cervical region 10/31/2012    Chronic back pain    Chronic idiopathic constipation    Chronic neck and back pain    Colon polyps    COPD (chronic obstructive pulmonary disease) with chronic bronchitis (HCC) 09/16/2013   Office Spirometry 10/30/2013-submaximal effort based on appearance of loop and curve. Numbers would fit with severe restriction but her physiologic capability may be better than this. FVC 0.91/44%, and 10.74/45%, FEV1/FVC 0.81, FEF 25-75% 1.43/69%     Diabetes mellitus    Type II   Diverticulosis    TCS 9/08 by Dr. Lina Sar for diarrhea . Bx for micro scopic colitis negative.    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   Metabolic encephalopathy 08/03/2011   Migraines    chronic headaches   Mononeuritis lower limb    Narcolepsy    Osteoporosis    Pancreatitis 2006   due to Depakote with normal EUS    Schatzki's ring    non critical / EGD with ED 8/2011with RMR   Seizures (HCC)    takes Lamictal daily.Last seizure 3 yrs ago   Sleep apnea    on CPAP   Small bowel obstruction (HCC)    Stroke (HCC)    left sided weakness, speech changes   Tubular adenoma of colon    Past Surgical History:  Procedure Laterality Date   ABDOMINAL HYSTERECTOMY  1978   BACK SURGERY  July 2012   BACTERIAL OVERGROWTH TEST N/A  05/05/2013   Procedure: BACTERIAL OVERGROWTH TEST;  Surgeon: Corbin Ade, MD;  Location: [facility];  Service: Endoscopy;  Laterality: N/A;  7:30   BIOPSY  THYROID  2009   BRAIN SURGERY  11/2011   resection of meningioma   BREAST REDUCTION SURGERY  1994   CARDIAC CATHETERIZATION  05/10/2005   normal coronaries, normal LV systolic function and EF (Dr. Evlyn Courier)   CARPAL TUNNEL RELEASE Left 07/22/04   Dr. Romeo Apple   CATARACT EXTRACTION Bilateral    CHOLECYSTECTOMY  1984   COLONOSCOPY N/A 09/25/2012   [id number] diverticulosis.  colonic polyp-removed : tubular adenoma   CRANIOTOMY  11/23/2011   Procedure: CRANIOTOMY TUMOR EXCISION;  Surgeon: Hewitt Shorts, MD;  Location: [facility];  Service: Neurosurgery;  Laterality: N/A;  Craniotomy for tumor resection   ESOPHAGOGASTRODUODENOSCOPY  12/29/2010   Rourk-Retained food in the esophagus and stomach, small hiatal hernia, status post Maloney dilation of the esophagus   ESOPHAGOGASTRODUODENOSCOPY N/A 09/25/2012   [id number] atonic baggy esophagus status post Maloney dilation 56 F. Hiatal hernia   GIVENS CAPSULE STUDY N/A 01/15/2013   NORMAL.    IR GENERIC HISTORICAL  03/17/2016   IR RADIOLOGIST EVAL & MGMT 03/17/2016 [facility] RAD   LESION REMOVAL N/A 05/31/2015   Procedure: REMOVAL RIGHT AND LEFT LESIONS OF MANDIBLE;  Surgeon: Ocie Doyne, DDS;  Location: [facility] OR;  Service: Oral Surgery;  Laterality: N/A;   MALONEY DILATION  12/29/2010   RMR;   NM MYOCAR PERF WALL MOTION  2006   "relavtiely normal" persantine, mild anterior thinning (breast attenuation artifact), no region of scar/ischemia   OVARIAN CYST REMOVAL     RECTOCELE REPAIR N/A 06/29/2015   Procedure: POSTERIOR REPAIR (RECTOCELE);  Surgeon: Tilda Burrow, MD;  Location: [facility] ORS;  Service: Gynecology;  Laterality: N/A;   REDUCTION MAMMAPLASTY Bilateral    SPINE SURGERY  09/29/2010   Dr. Shon Baton   surgical excision of 3 tumors from right thigh and right buttock  and left upper thigh  2010   TOOTH EXTRACTION Bilateral 12/14/2014   Procedure: REMOVAL OF BILATERAL MANDIBULAR EXOSTOSES;  Surgeon: Ocie Doyne, DDS;  Location: [facility] OR;  Service:  Oral Surgery;  Laterality: Bilateral;   TRANSTHORACIC ECHOCARDIOGRAM  2010   EF 60-65%, mild conc LVH, grade 1 diastolic dysfunction; mildly calcified MV annulus with mildly thickened leaflets, mildly calcified MR annulus   Family History  Problem Relation Age of Onset   Heart attack Mother        HTN   Pneumonia Father    Kidney failure Father    Diabetes Father    Pancreatic cancer Sister    Cancer Sister        breast    Cancer Sister        pancreatic   Diabetes Brother    Hypertension Brother    Diabetes Brother    Alcohol abuse Maternal Uncle    Stroke Maternal Grandmother    Heart attack Maternal Grandfather    Hypertension Son    Sleep apnea Son    Colon cancer Neg Hx    Anesthesia problems Neg Hx    Hypotension Neg Hx    Malignant hyperthermia Neg Hx    Pseudochol deficiency Neg Hx    Breast cancer Neg Hx    Stomach cancer Neg Hx    Social History   Socioeconomic History   Marital status: Divorced    Spouse name: Not on file   Number of children: 1  Years of education: 67   Highest education level: High school graduate  Occupational History   Occupation: Disabled  Tobacco Use   Smoking status: Every Day    Packs/day: 0.25    Years: 30.00    Pack years: 7.50    Types: Cigarettes   Smokeless tobacco: Never   Tobacco comments:    3-4 a day MRC 05/17/2021  Vaping Use   Vaping Use: Never used  Substance and Sexual Activity   Alcohol use: No    Alcohol/week: 0.0 standard drinks    Comment:     Drug use: No   Sexual activity: Not Currently  Other Topics Concern   Not on file  Social History Narrative   01/29/18 Lives alone, has 3 aides, Mon- Fri 8 hrs, 2 hrs on Sat-Sun, RN manages her meds   Caffeine use: Drink coffee sometimes    Right handed    Social Determinants of Corporate investment banker Strain: Low Risk    Difficulty of Paying Living Expenses: Not very hard  Food Insecurity: No Food Insecurity   Worried About Programme researcher, broadcasting/film/video in the  Last Year: Never true   Ran Out of Food in the Last Year: Never true  Transportation Needs: No Transportation Needs   Lack of Transportation (Medical): No   Lack of Transportation (Non-Medical): No  Physical Activity: Inactive   Days of Exercise per Week: 0 days   Minutes of Exercise per Session: 0 min  Stress: No Stress Concern Present   Feeling of Stress : Only a little  Social Connections: Moderately Isolated   Frequency of Communication with Friends and Family: Three times a week   Frequency of Social Gatherings with Friends and Family: Twice a week   Attends Religious Services: More than 4 times per year   Active Member of [employer] or Organizations: No   Attends Banker Meetings: Never   Marital Status: Divorced    Tobacco Counseling Ready to quit: Not Answered Counseling given: Not Answered Tobacco comments: 3-4 a day MRC 05/17/2021   Clinical Intake:  Pre-visit preparation completed: No  Pain : No/denies pain Pain Score: 5      Diabetes: Yes  How often do you need to have someone help you when you read instructions, pamphlets, or other written materials from your doctor or pharmacy?: 3 - Sometimes  Diabetic?Nutrition Risk Assessment:  Has the patient had any N/V/D within the last 2 months?  No  Does the patient have any non-healing wounds?  No  Has the patient had any unintentional weight loss or weight gain?  No   Diabetes:  Is the patient diabetic?  Yes  If diabetic, was a CBG obtained today?  No  Did the patient bring in their glucometer from home?  No  How often do you monitor your CBG's? 4x Daily .   Financial Strains and Diabetes Management:  Are you having any financial strains with the device, your supplies or your medication? No .  Does the patient want to be seen by Chronic Care Management for management of their diabetes?  No  Would the patient like to be referred to a Nutritionist or for Diabetic Management?  No   Diabetic  Exams:  Diabetic Eye Exam: Overdue for diabetic eye exam. Pt has been advised about the importance in completing this exam. Patient advised to call and schedule an eye exam. Diabetic Foot Exam: Completed 2022  followed by endo  Activities of Daily Living    07/11/2021    2:15 PM  In your present state of health, do you have any difficulty performing the following activities:  Hearing? 1  Vision? 1  Difficulty concentrating or making decisions? 1  Walking or climbing stairs? 1  Dressing or bathing? 1  Comment sometimes  Doing errands, shopping? 1  Comment doesn't drive, depends on aide  Preparing Food and eating ? Y  Using the Toilet? N  In the past six months, have you accidently leaked urine? N  Do you have problems with loss of bowel control? N  Managing your Medications? N  Managing your Finances? N  Housekeeping or managing your Housekeeping? Y    Patient Care Team: Kerri Perches, MD as PCP - General Rourk, Gerrit Friends, MD (Gastroenterology) Waymon Budge, MD as Consulting Physician (Pulmonary Disease) Shirlean Kelly, MD as Consulting Physician (Neurosurgery) Sheran Luz, MD as Consulting Physician (Physical Medicine and Rehabilitation) Drema Dallas, DO as Consulting Physician (Neurology) Janalyn Harder, MD as Consulting Physician (Dermatology) Newman Pies, MD as Consulting Physician (Otolaryngology) Ranee Gosselin, MD as Consulting Physician (Orthopedic Surgery) Myrlene Broker, MD as Consulting Physician (Behavioral Health) Venita Lick, MD as Consulting Physician (Orthopedic Surgery) Jodelle Gross, NP as Nurse Practitioner (Nurse Practitioner) Rachael Fee, MD as Attending Physician (Gastroenterology) Pyrtle, Carie Caddy, MD as Consulting Physician (Gastroenterology) Theressa Millard, PT as Physical Therapist (Physical Therapy) Fransisco Hertz, MD as Consulting Physician (Vascular Surgery) Merwyn Katos, DPM as Consulting Physician  (Podiatry) Nadara Mustard, MD as Consulting Physician (Orthopedic Surgery) Janalyn Harder, MD as Consulting Physician (Dermatology) Gavin Pound, [facility] (Inactive) (Pharmacist) Audrie Gallus, RN as [facility] Care Management Turner, Cornelious Bryant, MD as Consulting Physician (Cardiology)  Indicate any recent Medical Services you may have received from other than [facility] providers in the past year (date may be approximate).     Assessment:   This is a routine wellness examination for [place].  Hearing/Vision screen No results found.  Dietary issues and exercise activities discussed: Current Exercise Habits: The patient does not participate in regular exercise at present, Exercise limited by: orthopedic condition(s)   Goals Addressed             This Visit's Progress    Prevent falls   Not on track    Prevent falls      Quit smoking / using tobacco   On track    Doing the best she can at cutting back       Depression Screen    05/30/2021    4:22 PM 05/03/2021    9:47 AM 04/25/2021   10:24 AM 03/04/2021    9:30 AM 01/13/2021    4:21 PM 11/10/2020   10:13 AM 10/20/2020    1:17 PM  PHQ 2/9 Scores  PHQ - 2 Score  0 0 1  2 0  PHQ- 9 Score      8 0     Information is confidential and restricted. Go to Review Flowsheets to unlock data.    Fall Risk    07/11/2021    2:15 PM 07/05/2021    1:57 PM 05/10/2021   10:54 AM 05/03/2021    9:47 AM 04/25/2021   10:23 AM  Fall Risk   Falls in the past year? 1 1 1  0 1  Number falls in past yr: 1 1 1  0 1  Injury with Fall? 1 1 1  0 0  Risk for fall  due to :   Impaired balance/gait;Impaired mobility No Fall Risks History of fall(s)  Follow up    Falls evaluation completed Falls evaluation completed    FALL RISK PREVENTION PERTAINING TO THE HOME:  Any stairs in or around the home? No  If so, are there any without handrails? No  Home free of loose throw rugs in walkways, pet beds, electrical cords, etc? Yes  Adequate  lighting in your home to reduce risk of falls? Yes   ASSISTIVE DEVICES UTILIZED TO PREVENT FALLS:  Life alert? Yes  Use of a cane, walker or w/c? Yes  Grab bars in the bathroom? Yes  Shower chair or bench in shower? Yes  Elevated toilet seat or a handicapped toilet? Yes    Cognitive Function:    11/24/2020   11:44 AM 02/03/2019    2:25 PM 09/08/2015    8:37 AM 07/02/2014    9:50 AM 12/08/2013   10:28 AM  MMSE - Mini Mental State Exam  Orientation to time 5 5 5 5 5   Orientation to Place 5 5 3 5 5   Registration 3 3 2 3 2   Attention/ Calculation 0 5 5 3 5   Recall 2 1 2 3 2   Language- name 2 objects 2 2 2 2 2   Language- repeat 1 1 0 1 1  Language- follow 3 step command 3 3 3 3 3   Language- read & follow direction 1 1 1 1 1   Write a sentence 1 1 1  0 1  Copy design 0 0 0 0 0  Total score 23 27 24 26 27         07/07/2020    1:42 PM 07/07/2020    1:39 PM 07/07/2019    4:28 PM 06/12/2018    2:10 PM 01/31/2018   10:06 AM  6CIT Screen  What Year? 0 points 0 points 0 points 0 points 0 points  What month? 0 points 0 points 0 points 0 points 0 points  What time? 0 points  0 points 0 points 0 points  Count back from 20 2 points  0 points 0 points 0 points  Months in reverse 4 points  0 points 0 points 0 points  Repeat phrase 4 points  0 points 4 points 0 points  Total Score 10 points  0 points 4 points 0 points    Immunizations Immunization History  Administered Date(s) Administered   Fluad Quad(high Dose 65+) 01/01/2019, 12/11/2019, 11/10/2020   H1N1 01/09/2008   Influenza Split 12/12/2010, 11/16/2011   Influenza Whole 11/23/2006, 12/10/2007, 12/11/2008, 11/16/2009   Influenza,inj,Quad PF,6+ Mos 11/28/2012, 11/20/2013, 11/10/2014, 12/06/2015, 11/21/2016, 11/20/2017   Influenza,inj,quad, With Preservative 12/11/2016, 01/07/2018   Moderna Sars-Covid-2 Vaccination 04/27/2019, 05/25/2019, 02/16/2020   Pneumococcal Conjugate-13 09/16/2013   Pneumococcal Polysaccharide-23 08/03/2003,  12/11/2008, 08/22/2010, 08/31/2015   Td 09/23/2003   Tdap 12/29/2011   Zoster Recombinat (Shingrix) 02/12/2019, 07/03/2019    TDAP status: Up to date  Flu Vaccine status: Up to date  Pneumococcal vaccine status: Up to date  Covid-19 vaccine status: Completed vaccines  Qualifies for Shingles Vaccine? Yes   Zostavax completed No   Shingrix Completed?: Yes  Screening Tests Health Maintenance  Topic Date Due   COVID-19 Vaccine (4 - Booster for Moderna series) 04/12/2020   OPHTHALMOLOGY EXAM  05/26/2021   INFLUENZA VACCINE  10/11/2021   HEMOGLOBIN A1C  11/20/2021   TETANUS/TDAP  12/28/2021   FOOT EXAM  02/16/2022   MAMMOGRAM  12/15/2022   Pneumonia Vaccine 83+ Years old  Completed   DEXA SCAN  Completed   Hepatitis C Screening  Completed   Zoster Vaccines- Shingrix  Completed   HPV VACCINES  Aged Out   COLONOSCOPY (Pts 45-55yrs Insurance coverage will need to be confirmed)  Discontinued    Health Maintenance  Health Maintenance Due  Topic Date Due   COVID-19 Vaccine (4 - Booster for Moderna series) 04/12/2020   OPHTHALMOLOGY EXAM  05/26/2021    Colorectal cancer screening: Type of screening: Colonoscopy. Completed 2022. Repeat every 10 years  Mammogram status: Ordered  . Pt provided with contact info and advised to call to schedule appt.     Lung Cancer Screening: (Low Dose CT Chest recommended if Age 17-80 years, 30 pack-year currently smoking OR have quit w/in 15years.) does not qualify.   Lung Cancer Screening Referral: na  Additional Screening:  Hepatitis C Screening: does qualify; Completed   Vision Screening: Recommended annual ophthalmology exams for early detection of glaucoma and other disorders of the eye. Is the patient up to date with their annual eye exam?  Yes  Who is the provider or what is the name of the office in which the patient attends annual eye exams? Dr Ines Bloomer If pt is not established with a provider, would they like to be referred to a  provider to establish care? No .   Dental Screening: Recommended annual dental exams for proper oral hygiene  Community Resource Referral / Chronic Care Management: CRR required this visit?  No   CCM required this visit?  Yes      Plan:     I have personally reviewed and noted the following in the patient's chart:   Medical and social history Use of alcohol, tobacco or illicit drugs  Current medications and supplements including opioid prescriptions.  Functional ability and status Nutritional status Physical activity Advanced directives List of other physicians Hospitalizations, surgeries, and ER visits in previous 12 months Vitals Screenings to include cognitive, depression, and falls Referrals and appointments  In addition, I have reviewed and discussed with patient certain preventive protocols, quality metrics, and best practice recommendations. A written personalized care plan for preventive services as well as general preventive health recommendations were provided to patient.     Abner Greenspan, LPN   11/15/6385   Nurse Notes:  Ms. Vanhorne , Thank you for taking time to come for your Medicare Wellness Visit. I appreciate your ongoing commitment to your health goals. Please review the following plan we discussed and let me know if I can assist you in the future.   These are the goals we discussed:  Goals      Exercise 3x per week (30 min per time)     Recommend starting chair exercises at least 3 days a week for 30-45 minutes at a time as tolerated.        Medication Management     Patient Goals/Self-Care Activities Patient will:  Focus on medication adherence by keeping up with prescription refills and either using a pill box or reminders to take your medications at the prescribed times Check blood sugar four times a day at the following times: fasting (at least 8 hours since last food consumption), 1-2 hours after all meals, and whenever patient experiences  symptoms of hypo/hyperglycemia, document, and provide at future appointments Follow-up with all specialists as scheduled       Prevent falls     Prevent falls      Quit smoking / using tobacco  Doing the best she can at cutting back        This is a list of the screening recommended for you and due dates:  Health Maintenance  Topic Date Due   COVID-19 Vaccine (4 - Booster for Moderna series) 04/12/2020   Eye exam for diabetics  05/26/2021   Flu Shot  10/11/2021   Hemoglobin A1C  11/20/2021   Tetanus Vaccine  12/28/2021   Complete foot exam   02/16/2022   Mammogram  12/15/2022   Pneumonia Vaccine  Completed   DEXA scan (bone density measurement)  Completed   Hepatitis C Screening: USPSTF Recommendation to screen - Ages 78-79 yo.  Completed   Zoster (Shingles) Vaccine  Completed   HPV Vaccine  Aged Out   Colon Cancer Screening  Discontinued

## 2021-07-12 ENCOUNTER — Telehealth (INDEPENDENT_AMBULATORY_CARE_PROVIDER_SITE_OTHER): Payer: Medicare HMO | Admitting: Psychiatry

## 2021-07-12 ENCOUNTER — Encounter (HOSPITAL_COMMUNITY): Payer: Self-pay | Admitting: Psychiatry

## 2021-07-12 DIAGNOSIS — F331 Major depressive disorder, recurrent, moderate: Secondary | ICD-10-CM

## 2021-07-12 DIAGNOSIS — F3170 Bipolar disorder, currently in remission, most recent episode unspecified: Secondary | ICD-10-CM | POA: Diagnosis not present

## 2021-07-12 MED ORDER — LORAZEPAM 0.5 MG PO TABS: 0.5000 mg | ORAL_TABLET | Freq: Two times a day (BID) | ORAL | 2 refills | Status: DC

## 2021-07-12 MED ORDER — RISPERIDONE 0.5 MG PO TABS: 0.5000 mg | ORAL_TABLET | Freq: Every day | ORAL | 2 refills | Status: DC

## 2021-07-12 MED ORDER — TRAZODONE HCL 150 MG PO TABS: 150.0000 mg | ORAL_TABLET | Freq: Every day | ORAL | 2 refills | Status: DC

## 2021-07-12 MED ORDER — BUPROPION HCL ER (XL) 150 MG PO TB24: 150.0000 mg | ORAL_TABLET | ORAL | 2 refills | Status: DC

## 2021-07-12 MED ORDER — LAMOTRIGINE 100 MG PO TABS: 100.0000 mg | ORAL_TABLET | Freq: Two times a day (BID) | ORAL | 2 refills | Status: DC

## 2021-07-12 MED ORDER — SERTRALINE HCL 100 MG PO TABS: 100.0000 mg | ORAL_TABLET | ORAL | 2 refills | Status: DC

## 2021-07-12 NOTE — Progress Notes (Signed)
Virtual Visit via Telephone Note ? ?I connected with [name] on 07/12/21 at  4:20 PM EDT by telephone and verified that I am speaking with the correct person using two identifiers. ? ?Location: ?Patient: home ?Provider: office ?  ?I discussed the limitations, risks, security and privacy concerns of performing an evaluation and management service by telephone and the availability of in person appointments. I also discussed with the patient that there may be a patient responsible charge related to this service. The patient expressed understanding and agreed to proceed. ? ? ? ?  ?I discussed the assessment and treatment plan with the patient. The patient was provided an opportunity to ask questions and all were answered. The patient agreed with the plan and demonstrated an understanding of the instructions. ?  ?The patient was advised to call back or seek an in-person evaluation if the symptoms worsen or if the condition fails to improve as anticipated. ? ?I provided 20 minutes of non-face-to-face time during this encounter. ? ? ?[name] Spiller, MD ? ?BH MD/PA/NP OP Progress Note ? ?07/12/2021 4:51 PM ?[name]  ?MRN:  [id number] ? ?Chief Complaint:  ?Chief Complaint  ?Patient presents with  ? Anxiety  ? Depression  ? Follow-up  ? ?HPI: This patient is a 72 year old divorced white female who lives alone in [place].  She had worked in a [employer] but is now on disability ? ?The patient returns for follow-up after 3 months.  She states she is doing about the same although she is frustrated with a lot of people in her life.  She claims that her home health aides are stealing her pots and pans.  She does not trust her pastor at church and feels like the family members do not spend enough time with her.  She has a lot of complaints.  She claims she has tremor in the mornings and I offered to stop the respite all but she does not want to stop any of her medications.  She denies suicidal ideation significant  depression or anxiety.  She denies significant mood swings. ?Visit Diagnosis:  ?  ICD-10-CM   ?1. Major depressive disorder, recurrent episode, moderate (HCC)  F33.1   ?  ?2. Bipolar disorder in partial remission, most recent episode unspecified type ([place])  F31.70   ?  ? ? ?Past Psychiatric History: Numerous hospitalizations for depression years ago including ECT treatment ? ?Past Medical History:  ?Past Medical History:  ?Diagnosis Date  ? Allergy   ? Anemia   ? Anxiety   ? takes Ativan daily  ? Arthritis   ? Assistance needed for mobility   ? Bipolar disorder ([place])   ? takes Risperdal nightly  ? Blood transfusion   ? Brain tumor ([place])   ? Cancer [facility])   ? In her gum  ? Carpal tunnel syndrome of right wrist 05/23/2011  ? Cervical disc disorder with radiculopathy of cervical region 10/31/2012  ? Chronic back pain   ? Chronic idiopathic constipation   ? Chronic neck and back pain   ? Colon polyps   ? COPD (chronic obstructive pulmonary disease) with chronic bronchitis ([place]) 09/16/2013  ? Office Spirometry 10/30/2013-submaximal effort based on appearance of loop and curve. Numbers would fit with severe restriction but her physiologic capability may be better than this. FVC 0.91/44%, and 10.74/45%, FEV1/FVC 0.81, FEF 25-75% 1.43/69%    ? Diabetes mellitus   ? Type II  ? Diverticulosis   ? TCS 9/08 by Dr. Sydell Axon  [place] for diarrhea . Bx for micro scopic colitis negative.   ? Fibromyalgia   ? Frequent falls   ? GERD (gastroesophageal reflux disease)   ? takes Aciphex daily  ? Glaucoma   ? eye drops daily  ? Gum symptoms   ? infection on antibiotic  ? Hemiplegia affecting non-dominant side, post-stroke 08/02/2011  ? Hiatal hernia   ? Hyperlipidemia   ? takes Crestor daily  ? Hypertension   ? takes Amlodipine,Metoprolol,and Clonidine daily  ? Hypothyroidism   ? takes Synthroid daily  ? IBS (irritable bowel syndrome)   ? Insomnia   ? takes Trazodone nightly  ? Major depression, recurrent ([place])   ? takes Zoloft daily  ? Malignant  hyperpyrexia 04/25/2017  ? Metabolic encephalopathy 4/65/6812  ? Migraines   ? chronic headaches  ? Mononeuritis lower limb   ? Narcolepsy   ? Osteoporosis   ? Pancreatitis 2006  ? due to Depakote with normal EUS   ? Schatzki's ring   ? non critical / EGD with ED 8/2011with RMR  ? Seizures ([place])   ? takes Lamictal daily.Last seizure 3 yrs ago  ? Sleep apnea   ? on CPAP  ? Small bowel obstruction ([place])   ? Stroke [facility])   ? left sided weakness, speech changes  ? Tubular adenoma of colon   ?  ?Past Surgical History:  ?Procedure Laterality Date  ? ABDOMINAL HYSTERECTOMY  1978  ? BACK SURGERY  July 2012  ? BACTERIAL OVERGROWTH TEST N/A 05/05/2013  ? Procedure: BACTERIAL OVERGROWTH TEST;  Surgeon: Daneil Dolin, MD;  [facility];  Service: Endoscopy;  Laterality: N/A;  7:30  ? BIOPSY THYROID  2009  ? BRAIN SURGERY  11/2011  ? resection of meningioma  ? BREAST REDUCTION SURGERY  1994  ? CARDIAC CATHETERIZATION  05/10/2005  ? normal coronaries, normal LV systolic function and EF (Dr. Jackie Plum)  ? CARPAL TUNNEL RELEASE Left 07/22/04  ? Dr. Aline Brochure  ? CATARACT EXTRACTION Bilateral   ? CHOLECYSTECTOMY  1984  ? COLONOSCOPY N/A 09/25/2012  ? XNT:[id number] diverticulosis.  colonic polyp-removed : tubular adenoma  ? CRANIOTOMY  11/23/2011  ? Procedure: CRANIOTOMY TUMOR EXCISION;  Surgeon: Hosie Spangle, MD;  Location: [place] [facility];  Service: Neurosurgery;  Laterality: N/A;  Craniotomy for tumor resection  ? ESOPHAGOGASTRODUODENOSCOPY  12/29/2010  ? [place]ourk-Retained food in the esophagus and stomach, small hiatal hernia, status post Maloney dilation of the esophagus  ? ESOPHAGOGASTRODUODENOSCOPY N/A 09/25/2012  ? S[facility]:[id number] atonic baggy esophagus status post Maloney dilation 30 F. Hiatal hernia  ? GIVENS CAPSULE STUDY N/A 01/15/2013  ? NORMAL.   ? IR GENERIC HISTORICAL  03/17/2016  ? IR RADIOLOGIST EVAL & MGMT 03/17/2016 [facility]-INTERV RAD  ? LESION REMOVAL N/A 05/31/2015  ? Procedure: REMOVAL RIGHT AND LEFT LESIONS OF MANDIBLE;   Surgeon: Diona Browner, DDS;  Location: [place];  Service: Oral Surgery;  Laterality: N/A;  ? MALONEY DILATION  12/29/2010  ? RMR;  ? NM MYOCAR PERF WALL MOTION  2006  ? "relavtiely normal" persantine, mild anterior thinning (breast attenuation artifact), no region of scar/ischemia  ? OVARIAN CYST REMOVAL    ? RECTOCELE REPAIR N/A 06/29/2015  ? Procedure: POSTERIOR REPAIR (RECTOCELE);  Surgeon: Jonnie Kind, MD;  Location: [facility];  Service: Gynecology;  Laterality: N/A;  ? REDUCTION MAMMAPLASTY Bilateral   ? SPINE SURGERY  09/29/2010  ? Dr. Rolena Infante  ? surgical excision of 3 tumors from right thigh and right buttock  and left upper thigh  2010  ? TOOTH EXTRACTION Bilateral 12/14/2014  ? Procedure: REMOVAL OF BILATERAL MANDIBULAR EXOSTOSES;  Surgeon: Diona Browner, DDS;  Location: [place];  Service: Oral Surgery;  Laterality: Bilateral;  ? TRANSTHORACIC ECHOCARDIOGRAM  2010  ? EF 60-65%, mild conc LVH, grade 1 diastolic dysfunction; mildly calcified MV annulus with mildly thickened leaflets, mildly calcified MR annulus  ? ? ?Family Psychiatric History: See below ? ?Family History:  ?Family History  ?Problem Relation Age of Onset  ? Heart attack Mother   ?     HTN  ? Pneumonia Father   ? Kidney failure Father   ? Diabetes Father   ? Pancreatic cancer Sister   ? Cancer Sister   ?     breast   ? Cancer Sister   ?     pancreatic  ? Diabetes Brother   ? Hypertension Brother   ? Diabetes Brother   ? Alcohol abuse Maternal Uncle   ? Stroke Maternal Grandmother   ? Heart attack Maternal Grandfather   ? Hypertension Son   ? Sleep apnea Son   ? Colon cancer Neg Hx   ? Anesthesia problems Neg Hx   ? Hypotension Neg Hx   ? Malignant hyperthermia Neg Hx   ? Pseudochol deficiency Neg Hx   ? Breast cancer Neg Hx   ? Stomach cancer Neg Hx   ? ? ?Social History:  ?Social History  ? ?Socioeconomic History  ? Marital status: Divorced  ?  Spouse name: Not on file  ? Number of children: 1  ? Years of education: 26  ? Highest education level:  High school graduate  ?Occupational History  ? Occupation: Disabled  ?Tobacco Use  ? Smoking status: Every Day  ?  Packs/day: 0.25  ?  Years: 30.00  ?  Pack years: 7.50  ?  Types: Cigarettes  ? Smokeless tobacc

## 2021-07-13 ENCOUNTER — Telehealth: Payer: Medicare HMO | Admitting: Family Medicine

## 2021-07-14 ENCOUNTER — Ambulatory Visit (HOSPITAL_COMMUNITY): Payer: Medicare HMO | Admitting: Physician Assistant

## 2021-07-14 ENCOUNTER — Ambulatory Visit (INDEPENDENT_AMBULATORY_CARE_PROVIDER_SITE_OTHER): Payer: Medicare HMO | Admitting: Nurse Practitioner

## 2021-07-14 ENCOUNTER — Encounter: Payer: Self-pay | Admitting: Nurse Practitioner

## 2021-07-14 ENCOUNTER — Ambulatory Visit: Admission: RE | Admit: 2021-07-14 | Payer: Medicare HMO | Source: Ambulatory Visit

## 2021-07-14 VITALS — BP 132/60 | HR 69 | Ht 59.0 in | Wt 169.0 lb

## 2021-07-14 DIAGNOSIS — M5010 Cervical disc disorder with radiculopathy, unspecified cervical region: Secondary | ICD-10-CM

## 2021-07-14 DIAGNOSIS — R52 Pain, unspecified: Secondary | ICD-10-CM | POA: Diagnosis not present

## 2021-07-14 DIAGNOSIS — M5440 Lumbago with sciatica, unspecified side: Secondary | ICD-10-CM

## 2021-07-14 DIAGNOSIS — I10 Essential (primary) hypertension: Secondary | ICD-10-CM | POA: Diagnosis not present

## 2021-07-14 MED ORDER — KETOROLAC TROMETHAMINE 60 MG/2ML IM SOLN
30.0000 mg | Freq: Once | INTRAMUSCULAR | Status: AC
  Administered 2021-07-14: 30 mg via INTRAMUSCULAR

## 2021-07-14 MED ORDER — KETOROLAC TROMETHAMINE 60 MG/2ML IM SOLN: 60.0000 mg | Freq: Once | INTRAMUSCULAR | Status: DC

## 2021-07-14 NOTE — Progress Notes (Signed)
? ?  [name]     MRN: [id number]      DOB: [birth year] ? ? ?HPI ?[name] with past medical history of essential hypertension, obstructive sleep apnea, COPD, GERD, cyclical disc disorder with critical low patency of cervical region, lumbar spondylosis with myelopathy, cervical radiculopathy is here for complaints of worsening  chronic low mid back pain and  left shoulder pain  since the past two weeks. Pain is 5/10. Has trouble getting in an out of her bed due to pain . She has been taking percocet as needed. Taking lyrica '75mg'$  tablest daily .  ? ? ? ?ROS ?Denies recent fever or chills. ?Denies sinus pressure, nasal congestion, ear pain or sore throat. ?Denies chest congestion, productive cough or wheezing. ?Denies chest pains, palpitations and leg swelling ?Denies abdominal pain, nausea, vomiting,diarrhea or constipation.   ?Denies dysuria, frequency, hesitancy or incontinence. ?Denies headaches, seizures, numbness, or tingling. ? ? ?PE ? ?BP 132/60 (BP Location: Right Arm, Patient Position: Sitting, Cuff Size: Large)   Pulse 69   Ht '4\' 11"'$  (1.499 m)   Wt 169 lb (76.7 kg)   SpO2 94%   BMI 34.13 kg/m?  ? ?Patient alert and oriented and in no cardiopulmonary distress. ? ?Chest: Clear to auscultation bilaterally. ? ?CVS: S1, S2 no murmurs, no S3.Regular rate. ? ?ABD: Soft non tender.  ? ?Ext: No edema ? ?MS: decreased ROM spine, shoulders, hips and knees, tenderness on palpation of mid low back, tenderness on range of motion of left shoulder.  ? ?Psych: Good eye contact, normal affect. Memory intact not anxious or depressed appearing. ? ? ?Assessment & Plan ? ?Essential hypertension ?BP Readings from Last 3 Encounters:  ?07/14/21 132/60  ?07/05/21 (!) 152/63  ?06/29/21 100/60  ?Chronic condition well-controlled  ?Continue current medications ?DASH diet advised engage in regular daily exercises at least 150 minutes as tolerated.  ? ?Cervical disc disorder with radiculopathy of cervical region ?Toradol 30 mg  injection given today ?Continue Percocet as needed Lyrica 75 mg 3 times daily. ?Encouraged use of heat ?Walking exercise daily at least 30 minutes as tolerated ? ?Backache ?Toradol 30 mg injection given today ?Continue Percocet as needed Lyrica 75 mg 3 times daily. ?Encouraged use of heat ?Walking exercise daily at least 30 minutes as tolerated ?  ?

## 2021-07-14 NOTE — Assessment & Plan Note (Signed)
BP Readings from Last 3 Encounters:  ?07/14/21 132/60  ?07/05/21 (!) 152/63  ?06/29/21 100/60  ?Chronic condition well-controlled  ?Continue current medications ?DASH diet advised engage in regular daily exercises at least 150 minutes as tolerated.  ?

## 2021-07-14 NOTE — Assessment & Plan Note (Signed)
Toradol 30 mg injection given today ?Continue Percocet as needed Lyrica 75 mg 3 times daily. ?Encouraged use of heat ?Walking exercise daily at least 30 minutes as tolerated ?

## 2021-07-14 NOTE — Assessment & Plan Note (Signed)
Toradol 30 mg injection given today ?Continue Percocet as needed Lyrica 75 mg 3 times daily. ?Encouraged use of heat ?Walking exercise daily at least 30 minutes as tolerated ? ?

## 2021-07-14 NOTE — Patient Instructions (Addendum)
Please continue your percocet and lyrica as ordered for your pain  ?You will get a Toradol injection today ? ? ?It is important that you exercise as tolerated at least 30 minutes 5 times a week.  ?Think about what you will eat, plan ahead. ?Choose " clean, green, fresh or frozen" over canned, processed or packaged foods which are more sugary, salty and fatty. ?70 to 75% of food eaten should be vegetables and fruit. ?Three meals at set times with snacks allowed between meals, but they must be fruit or vegetables. ?Aim to eat over a 12 hour period , example 7 am to 7 pm, and STOP after  your last meal of the day. ?Drink water,generally about 64 ounces per day, no other drink is as healthy. Fruit juice is best enjoyed in a healthy way, by EATING the fruit. ? ?Thanks for choosing [facility], we consider it a privelige to serve you.  ?

## 2021-07-15 ENCOUNTER — Emergency Department (HOSPITAL_COMMUNITY)
Admission: EM | Admit: 2021-07-15 | Discharge: 2021-07-15 | Disposition: A | Discharge status: Home / Self Care | Payer: Medicare HMO | Attending: Student | Admitting: Student

## 2021-07-15 ENCOUNTER — Emergency Department (HOSPITAL_COMMUNITY): Payer: Medicare HMO

## 2021-07-15 ENCOUNTER — Telehealth: Payer: Self-pay

## 2021-07-15 ENCOUNTER — Other Ambulatory Visit: Payer: Self-pay

## 2021-07-15 ENCOUNTER — Encounter (HOSPITAL_COMMUNITY): Payer: Self-pay | Admitting: *Deleted

## 2021-07-15 DIAGNOSIS — J449 Chronic obstructive pulmonary disease, unspecified: Secondary | ICD-10-CM | POA: Insufficient documentation

## 2021-07-15 DIAGNOSIS — Z85819 Personal history of malignant neoplasm of unspecified site of lip, oral cavity, and pharynx: Secondary | ICD-10-CM | POA: Insufficient documentation

## 2021-07-15 DIAGNOSIS — M79604 Pain in right leg: Secondary | ICD-10-CM | POA: Diagnosis present

## 2021-07-15 DIAGNOSIS — Z79899 Other long term (current) drug therapy: Secondary | ICD-10-CM | POA: Diagnosis not present

## 2021-07-15 DIAGNOSIS — E119 Type 2 diabetes mellitus without complications: Secondary | ICD-10-CM | POA: Insufficient documentation

## 2021-07-15 DIAGNOSIS — Z7982 Long term (current) use of aspirin: Secondary | ICD-10-CM | POA: Insufficient documentation

## 2021-07-15 DIAGNOSIS — Z794 Long term (current) use of insulin: Secondary | ICD-10-CM | POA: Insufficient documentation

## 2021-07-15 DIAGNOSIS — M5431 Sciatica, right side: Secondary | ICD-10-CM | POA: Insufficient documentation

## 2021-07-15 DIAGNOSIS — I10 Essential (primary) hypertension: Secondary | ICD-10-CM | POA: Insufficient documentation

## 2021-07-15 DIAGNOSIS — M549 Dorsalgia, unspecified: Secondary | ICD-10-CM | POA: Insufficient documentation

## 2021-07-15 DIAGNOSIS — M5106 Intervertebral disc disorders with myelopathy, lumbar region: Secondary | ICD-10-CM | POA: Diagnosis not present

## 2021-07-15 DIAGNOSIS — Z7951 Long term (current) use of inhaled steroids: Secondary | ICD-10-CM | POA: Insufficient documentation

## 2021-07-15 DIAGNOSIS — E039 Hypothyroidism, unspecified: Secondary | ICD-10-CM | POA: Insufficient documentation

## 2021-07-15 DIAGNOSIS — M5441 Lumbago with sciatica, right side: Secondary | ICD-10-CM | POA: Diagnosis not present

## 2021-07-15 DIAGNOSIS — F17210 Nicotine dependence, cigarettes, uncomplicated: Secondary | ICD-10-CM | POA: Insufficient documentation

## 2021-07-15 DIAGNOSIS — M4716 Other spondylosis with myelopathy, lumbar region: Secondary | ICD-10-CM | POA: Diagnosis not present

## 2021-07-15 MED ORDER — METHYLPREDNISOLONE 4 MG PO TBPK: ORAL_TABLET | ORAL | 0 refills | Status: DC

## 2021-07-15 MED ORDER — KETOROLAC TROMETHAMINE 15 MG/ML IJ SOLN
15.0000 mg | Freq: Once | INTRAMUSCULAR | Status: AC
Start: 2021-07-15 — End: 2021-07-15
  Administered 2021-07-15: 15 mg via INTRAMUSCULAR
  Filled 2021-07-15: qty 1

## 2021-07-15 MED ORDER — HYDROCODONE-ACETAMINOPHEN 5-325 MG PO TABS
1.0000 | ORAL_TABLET | Freq: Once | ORAL | Status: AC
  Administered 2021-07-15: 1 via ORAL
  Filled 2021-07-15: qty 1

## 2021-07-15 MED ORDER — METHYLPREDNISOLONE 4 MG PO TBPK
ORAL_TABLET | ORAL | 0 refills | Status: DC
Start: 2021-07-15 — End: 2021-07-15

## 2021-07-15 MED ORDER — METHYLPREDNISOLONE 4 MG PO TBPK
ORAL_TABLET | ORAL | 0 refills | Status: DC
Start: 2021-07-15 — End: 2021-09-16

## 2021-07-15 NOTE — ED Notes (Signed)
Bedpan given ?

## 2021-07-15 NOTE — Telephone Encounter (Signed)
I have [name] on the phone saying has feeling of pens and needles in her leg this morning. ? ?Per nurse needs to go to ER or urgent care ?

## 2021-07-15 NOTE — ED Provider Notes (Signed)
?[place] ?Provider Note ? ?CSN: [id number] ?Arrival date & time: 07/15/21 1156 ? ?Chief Complaint(s) ?Leg Pain ? ?HPI ?[name] is a 71 y.o. female who presents emergency department for evaluation of leg pain.  She states that the pain begins in her back and radiates down the right leg.  She states that she has had a long chronic history of back pain and that her pain is not significantly worse today but over the last 1 week she feels like it is worse.  She states that she was last evaluated by neurosurgeon 10 years ago who told her that she has chronic condition that would not be fixed with surgery but has not seen a neurosurgeon since.  She saw her primary care physician yesterday who refilled her Percocet and Lyrica.  She states that she is unsure the last time anybody did imaging of her back but is worried that her legs are getting weaker.  She denies chest pain, shortness of breath, abdominal pain, nausea, vomiting or other systemic symptoms. ? ? ?Past Medical History ?Past Medical History:  ?Diagnosis Date  ? Allergy   ? Anemia   ? Anxiety   ? takes Ativan daily  ? Arthritis   ? Assistance needed for mobility   ? Bipolar disorder ([place])   ? takes Risperdal nightly  ? Blood transfusion   ? Brain tumor ([place])   ? Cancer [facility])   ? In her gum  ? Carpal tunnel syndrome of right wrist 05/23/2011  ? Cervical disc disorder with radiculopathy of cervical region 10/31/2012  ? Chronic back pain   ? Chronic idiopathic constipation   ? Chronic neck and back pain   ? Colon polyps   ? COPD (chronic obstructive pulmonary disease) with chronic bronchitis ([place]) 09/16/2013  ? Office Spirometry 10/30/2013-submaximal effort based on appearance of loop and curve. Numbers would fit with severe restriction but her physiologic capability may be better than this. FVC 0.91/44%, and 10.74/45%, FEV1/FVC 0.81, FEF 25-75% 1.43/69%    ? Diabetes mellitus   ? Type II  ? Diverticulosis   ? TCS 9/08 by Dr. Delfin Edis for  diarrhea . Bx for micro scopic colitis negative.   ? Fibromyalgia   ? Frequent falls   ? GERD (gastroesophageal reflux disease)   ? takes Aciphex daily  ? Glaucoma   ? eye drops daily  ? Gum symptoms   ? infection on antibiotic  ? Hemiplegia affecting non-dominant side, post-stroke 08/02/2011  ? Hiatal hernia   ? Hyperlipidemia   ? takes Crestor daily  ? Hypertension   ? takes Amlodipine,Metoprolol,and Clonidine daily  ? Hypothyroidism   ? takes Synthroid daily  ? IBS (irritable bowel syndrome)   ? Insomnia   ? takes Trazodone nightly  ? Major depression, recurrent ([place])   ? takes Zoloft daily  ? Malignant hyperpyrexia 04/25/2017  ? Metabolic encephalopathy 3/50/0938  ? Migraines   ? chronic headaches  ? Mononeuritis lower limb   ? Narcolepsy   ? Osteoporosis   ? Pancreatitis 2006  ? due to Depakote with normal EUS   ? Schatzki's ring   ? non critical / EGD with ED 8/2011with RMR  ? Seizures ([place])   ? takes Lamictal daily.Last seizure 3 yrs ago  ? Sleep apnea   ? on CPAP  ? Small bowel obstruction ([place])   ? Stroke [facility])   ? left sided weakness, speech changes  ? Tubular adenoma of colon   ? ?Patient  Active Problem List  ? Diagnosis Date Noted  ? Breast pain, left 07/10/2021  ? 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 Atherosclerosis of aorta ([place]) 07/10/2021  ? Fatigue 05/12/2021  ? Advanced care planning/counseling discussion 12/23/2020  ? Head trauma, initial encounter 12/20/2020  ? Trigger finger, right 12/12/2020  ? Confusion 11/24/2020  ? Blurry vision 11/18/2020  ? Cervical radiculopathy 07/09/2020  ? Closed fracture of lateral malleolus of right fibula 02/27/2020  ? Headache 02/19/2020  ? Tubular adenoma of colon 02/09/2019  ? Benign neoplasm of cerebral meninges ([place]) 11/12/2018  ? Benign meningioma of brain ([place]) 10/31/2018  ? Osteoarthritis of both hips 07/06/2018  ? Lipoma of extremity 12/31/2017  ? Impingement syndrome of left shoulder  region 12/27/2017  ? Leg weakness, bilateral 10/28/2017  ? Lumbar spondylosis with myelopathy 10/12/2017  ? Numbness of hand 10/10/2017  ? Chronic neck pain 07/25/2017  ? Chronic pain syndrome 07/25/2017  ? At risk for cardiovascular event 06/10/2017  ? Diabetic polyneuropathy associated with type 2 diabetes mellitus ([place]) 05/19/2016  ? Tobacco user 04/24/2016  ? Obesity (BMI 30.0-34.9) 04/24/2016  ? History of palpitations 08/09/2015  ? Labile hypertension 08/03/2015  ? Normal coronary arteries 08/03/2015  ? Dizziness 07/15/2015  ? Left-sided low back pain with left-sided sciatica 06/27/2015  ? Multinodular goiter 05/06/2015  ? Rectocele, female 04/27/2015  ? Anal sphincter incontinence 04/27/2015  ? Pelvic relaxation due to rectocele 03/30/2015  ? Pulmonary hypertension ([place]) 02/22/2015  ? Posterior chest pain 02/21/2015  ? Episodic cigarette smoking dependence 01/11/2015  ? Migraine without aura and without status migrainosus, not intractable 07/02/2014  ? Flatulence 02/18/2014  ? Microcytic anemia 02/18/2014  ? COPD mixed type ([place]) 09/16/2013  ? Hypothyroidism 08/16/2013  ? Gastroparesis 04/28/2013  ? Nicotine dependence 03/09/2013  ? Seizure disorder ([place]) 01/19/2013  ? Displacement of cervical intervertebral disc without myelopathy 12/13/2012  ? Bursitis of shoulder 12/13/2012  ? Cervical disc disorder with radiculopathy of cervical region 10/31/2012  ? Solitary pulmonary nodule 08/19/2012  ? Anemia 07/05/2012  ? Hypersomnia disorder related to a known organic factor 06/11/2012  ? Pruritus 04/18/2012  ? Meningioma ([place]) 11/19/2011  ? Mononeuritis leg 10/25/2011  ? Carpal tunnel syndrome of right wrist 05/23/2011  ? Chronic pain of right hand 05/04/2011  ? Polypharmacy 04/28/2011  ? Bipolar disorder ([place]) 04/28/2011  ? Constipation 04/13/2011  ? Recurrent falls 12/12/2010  ? Oropharyngeal dysphagia 07/12/2010  ? Urinary incontinence 12/16/2009  ? HEARING LOSS 10/26/2009  ? Well controlled type 2 diabetes  mellitus with gastroparesis ([place]) 07/07/2009  ? Hyperlipidemia 12/11/2008  ? IBS 12/11/2008  ? GERD 07/29/2008  ? MILK PRODUCTS ALLERGY 07/29/2008  ? 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 Essential hypertension 06/27/2007  ? Backache 06/19/2007  ? Osteoporosis 06/19/2007  ? Obstructive sleep apnea 06/19/2007  ? TRIGGER FINGER 04/18/2007  ? DIVERTICULOSIS, COLON 11/13/2006  ? ?Home Medication(s) ?Prior to Admission medications   ?Medication Sig Start Date End Date Taking? Authorizing Provider  ?methylPREDNISolone (MEDROL DOSEPAK) 4 MG TBPK tablet Take as prescribed 07/15/21  Yes Betul Brisky, MD  ?ACCU-CHEK GUIDE test strip TEST BLOOD SUGAR FOUR TIMES DAILY  AND AS NEEDED 05/30/21   Philemon Kingdom, MD  ?Accu-Chek Softclix Lancets lancets TEST BLOOD SUGAR THREE TIMES DAILY AS DIRECTED 03/28/21   Philemon Kingdom, MD  ?albuterol (VENTOLIN HFA) 108 (90 Base) MCG/ACT inhaler INHALE 1 TO 2 PUFFS EVERY 6 HOURS AS NEEDED FOR WHEEZING,  SHORTNESS OF BREATH 06/24/21   Baird Lyons D, MD  ?Alcohol Swabs (DROPSAFE ALCOHOL PREP) 70 % PADS USE TO CLEAN FINGER PRIOR TO TESTING FOR BLOOD SUGAR  AS DIRECTED 05/20/21   Philemon Kingdom, MD  ?alendronate (FOSAMAX) 70 MG tablet TAKE 1 TABLET (70 MG TOTAL) BY MOUTH EVERY 7 (SEVEN) DAYS. TAKE WITH A FULL GLASS OF WATER ON AN EMPTY STOMACH. 02/04/21   [name], MD  ?amLODipine (NORVASC) 10 MG tablet TAKE 1 TABLET EVERY DAY 06/06/21   Imogene Burn, PA-C  ?ascorbic acid (VITAMIN C) 500 MG tablet Take 500 mg by mouth daily.    [provider]  ?aspirin EC 81 MG tablet Take 1 tablet (81 mg total) by mouth daily with breakfast. 12/08/18   Roxan Hockey, MD  ?betamethasone dipropionate 0.05 % cream  02/17/20   [provider]  ?Blood Glucose Calibration (ACCU-CHEK GUIDE CONTROL) LIQD USE AS DIRECTED 12/13/20   Philemon Kingdom, MD  ?blood glucose meter kit and supplies Dispense based on patient and insurance preference.  Use up to four times daily as directed. (FOR ICD-10 E10.9, E11.9). 06/27/19   Philemon Kingdom, MD  ?buPROPion (WELLBUTRIN XL) 150 MG 24 hr tablet Take 1 tablet (150 mg total) by mouth every morning. 5/2/2

## 2021-07-15 NOTE — ED Triage Notes (Signed)
C/o R lower leg tightness/ pain. Onset Monday. Denies fever, CP or sob. Feels tight, and like needles. ?

## 2021-07-18 ENCOUNTER — Telehealth: Payer: Self-pay | Admitting: Family Medicine

## 2021-07-18 NOTE — Telephone Encounter (Signed)
Patient called in regard to recent  ed visit and MRI  ? ?Wants a call back in regard.  ? ?Wants to know the next steps she needs to take. ?

## 2021-07-19 ENCOUNTER — Ambulatory Visit (INDEPENDENT_AMBULATORY_CARE_PROVIDER_SITE_OTHER): Payer: Medicare HMO | Admitting: Family Medicine

## 2021-07-19 ENCOUNTER — Encounter: Payer: Self-pay | Admitting: Family Medicine

## 2021-07-19 DIAGNOSIS — M2550 Pain in unspecified joint: Secondary | ICD-10-CM | POA: Diagnosis not present

## 2021-07-19 DIAGNOSIS — R110 Nausea: Secondary | ICD-10-CM

## 2021-07-19 DIAGNOSIS — M5431 Sciatica, right side: Secondary | ICD-10-CM | POA: Insufficient documentation

## 2021-07-19 DIAGNOSIS — M47819 Spondylosis without myelopathy or radiculopathy, site unspecified: Secondary | ICD-10-CM | POA: Diagnosis not present

## 2021-07-19 DIAGNOSIS — G8929 Other chronic pain: Secondary | ICD-10-CM | POA: Diagnosis not present

## 2021-07-19 MED ORDER — KETOROLAC TROMETHAMINE 30 MG/ML IJ SOLN
30.0000 mg | Freq: Once | INTRAMUSCULAR | Status: AC
  Administered 2021-07-19: 30 mg via INTRAMUSCULAR

## 2021-07-19 MED ORDER — ONDANSETRON HCL 4 MG PO TABS: 4.0000 mg | ORAL_TABLET | Freq: Three times a day (TID) | ORAL | 2 refills | Status: DC | PRN

## 2021-07-19 NOTE — Patient Instructions (Addendum)
I appreciate the opportunity to provide care to you today! ?  ?Referrals today-  Neurosurgery in [place] with Dr. Reatha Armour and [facility] in [place] with Dr. Nelva Bush ?  ?Please continue to take your pain medication as prescribed. ? ?I've ordered Zofran for your nausea. ? ? ?  ?It was a pleasure to see you and I look forward to continuing to work together on your health and well-being. ?Please do not hesitate to call the office if you need care or have questions about your care. ?  ?Have a wonderful day and week. ?With Gratitude, ?Alvira Monday MSN, FNP-BC ? ?

## 2021-07-19 NOTE — Assessment & Plan Note (Addendum)
Labs and imaging reviewed ?Toradol '30mg'$  given to help with pain ?Referral to the neurosurgeon and the orthopedic for worsening arthritis in her spine ?Encouraged to continue treatment regimen ?

## 2021-07-19 NOTE — Progress Notes (Addendum)
? ?Established Patient Office Visit ? ?Subjective:  ?Patient ID: [name], female    DOB: 21-Jan-1951  Age: 71 y.o. MRN: [id number] ? ?CC:  ?Chief Complaint  ?Patient presents with  ? Follow-up  ?  Was seen at the ED on 07/15/21, complains of hurting in her back and on right hip.   ? ? ?HPI ?[name] is a 71 y.o. female with past medical history of essential hypertension, pulmonary hypertension, and atherosclerosis of the aorta presents today for ER follow-up. She was seen in the ER on 07/15/21 for sciatica on the right side and was treated with Toradol, Norco '5mg'$ , and sent home with the [place]. She has completed the [facility] dosepak, which provided mild symptom relief. She complains of back and right-sided pain since being discharged from the hospital. Pain intensifies with prolonged sitting, standing, and getting out of bed. She states that the pain medications temporarily soothe the pain but still lingers. She reports seeing Dr. Nelva Bush at [facility] with an upcoming appointment on August 18, 2021. She also says Dr.Dahari D. Brooks operated on her back in 2012. She noted seeing Dr. Jovita Gamma, a neurosurgeon in [place] who worked on her brain a few years ago, but he is now retired from [employer], so she is following up with Dr. Reatha Armour.  ? ?She rates her pain 8/10 and reports last taking her pain medicine this morning at 3 am. ?Past Medical History:  ?Diagnosis Date  ? Allergy   ? Anemia   ? Anxiety   ? takes Ativan daily  ? Arthritis   ? Assistance needed for mobility   ? Bipolar disorder ([place])   ? takes Risperdal nightly  ? Blood transfusion   ? Brain tumor ([place])   ? Cancer [facility])   ? In her gum  ? Carpal tunnel syndrome of right wrist 05/23/2011  ? Cervical disc disorder with radiculopathy of cervical region 10/31/2012  ? Chronic back pain   ? Chronic idiopathic constipation   ? Chronic neck and back pain   ? Colon polyps   ? COPD (chronic obstructive pulmonary disease) with chronic  bronchitis ([place]) 09/16/2013  ? Office Spirometry 10/30/2013-submaximal effort based on appearance of loop and curve. Numbers would fit with severe restriction but her physiologic capability may be better than this. FVC 0.91/44%, and 10.74/45%, FEV1/FVC 0.81, FEF 25-75% 1.43/69%    ? Diabetes mellitus   ? Type II  ? Diverticulosis   ? TCS 9/08 by Dr. Delfin Edis for diarrhea . Bx for micro scopic colitis negative.   ? Fibromyalgia   ? Frequent falls   ? GERD (gastroesophageal reflux disease)   ? takes Aciphex daily  ? Glaucoma   ? eye drops daily  ? Gum symptoms   ? infection on antibiotic  ? Hemiplegia affecting non-dominant side, post-stroke 08/02/2011  ? Hiatal hernia   ? Hyperlipidemia   ? takes Crestor daily  ? Hypertension   ? takes Amlodipine,Metoprolol,and Clonidine daily  ? Hypothyroidism   ? takes Synthroid daily  ? IBS (irritable bowel syndrome)   ? Insomnia   ? takes Trazodone nightly  ? Major depression, recurrent ([place])   ? takes Zoloft daily  ? Malignant hyperpyrexia 04/25/2017  ? Metabolic encephalopathy 8/52/7782  ? Migraines   ? chronic headaches  ? Mononeuritis lower limb   ? Narcolepsy   ? Osteoporosis   ? Pancreatitis 2006  ? due to Depakote with normal EUS   ? Schatzki's ring   ?  non critical / EGD with ED 8/2011with RMR  ? Seizures ([place])   ? takes Lamictal daily.Last seizure 3 yrs ago  ? Sleep apnea   ? on CPAP  ? Small bowel obstruction ([place])   ? Stroke [facility])   ? left sided weakness, speech changes  ? Tubular adenoma of colon   ? ? ?Past Surgical History:  ?Procedure Laterality Date  ? ABDOMINAL HYSTERECTOMY  1978  ? BACK SURGERY  July 2012  ? BACTERIAL OVERGROWTH TEST N/A 05/05/2013  ? Procedure: BACTERIAL OVERGROWTH TEST;  Surgeon: Daneil Dolin, MD;  [facility];  Service: Endoscopy;  Laterality: N/A;  7:30  ? BIOPSY THYROID  2009  ? BRAIN SURGERY  11/2011  ? resection of meningioma  ? BREAST REDUCTION SURGERY  1994  ? CARDIAC CATHETERIZATION  05/10/2005  ? normal coronaries, normal  LV systolic function and EF (Dr. Jackie Plum)  ? CARPAL TUNNEL RELEASE Left 07/22/04  ? Dr. Aline Brochure  ? CATARACT EXTRACTION Bilateral   ? CHOLECYSTECTOMY  1984  ? COLONOSCOPY N/A 09/25/2012  ? [id number] diverticulosis.  colonic polyp-removed : tubular adenoma  ? CRANIOTOMY  11/23/2011  ? Procedure: CRANIOTOMY TUMOR EXCISION;  Surgeon: Hosie Spangle, MD;  Location: [place] [facility];  Service: Neurosurgery;  Laterality: N/A;  Craniotomy for tumor resection  ? ESOPHAGOGASTRODUODENOSCOPY  12/29/2010  ? Rourk-Retained food in the esophagus and stomach, small hiatal hernia, status post Maloney dilation of the esophagus  ? ESOPHAGOGASTRODUODENOSCOPY N/A 09/25/2012  ? VWP:[id number] atonic baggy esophagus status post Maloney dilation 29 F. Hiatal hernia  ? GIVENS CAPSULE STUDY N/A 01/15/2013  ? NORMAL.   ? IR GENERIC HISTORICAL  03/17/2016  ? IR RADIOLOGIST EVAL & MGMT 03/17/2016 [facility]-INTERV RAD  ? LESION REMOVAL N/A 05/31/2015  ? Procedure: REMOVAL RIGHT AND LEFT LESIONS OF MANDIBLE;  Surgeon: Diona Browner, DDS;  Location: [place];  Service: Oral Surgery;  Laterality: N/A;  ? MALONEY DILATION  12/29/2010  ? RMR;  ? NM MYOCAR PERF WALL MOTION  2006  ? "relavtiely normal" persantine, mild anterior thinning (breast attenuation artifact), no region of scar/ischemia  ? OVARIAN CYST REMOVAL    ? RECTOCELE REPAIR N/A 06/29/2015  ? Procedure: POSTERIOR REPAIR (RECTOCELE);  Surgeon: Jonnie Kind, MD;  Location: [facility];  Service: Gynecology;  Laterality: N/A;  ? REDUCTION MAMMAPLASTY Bilateral   ? SPINE SURGERY  09/29/2010  ? Dr. Rolena Infante  ? surgical excision of 3 tumors from right thigh and right buttock  and left upper thigh  2010  ? TOOTH EXTRACTION Bilateral 12/14/2014  ? Procedure: REMOVAL OF BILATERAL MANDIBULAR EXOSTOSES;  Surgeon: Diona Browner, DDS;  Location: [place];  Service: Oral Surgery;  Laterality: Bilateral;  ? TRANSTHORACIC ECHOCARDIOGRAM  2010  ? EF 60-65%, mild conc LVH, grade 1 diastolic dysfunction; mildly calcified MV annulus with  mildly thickened leaflets, mildly calcified MR annulus  ? ? ?Family History  ?Problem Relation Age of Onset  ? Heart attack Mother   ?     HTN  ? Pneumonia Father   ? Kidney failure Father   ? Diabetes Father   ? Pancreatic cancer Sister   ? Cancer Sister   ?     breast   ? Cancer Sister   ?     pancreatic  ? Diabetes Brother   ? Hypertension Brother   ? Diabetes Brother   ? Alcohol abuse Maternal Uncle   ? Stroke Maternal Grandmother   ? Heart attack Maternal Grandfather   ?  Hypertension Son   ? Sleep apnea Son   ? Colon cancer Neg Hx   ? Anesthesia problems Neg Hx   ? Hypotension Neg Hx   ? Malignant hyperthermia Neg Hx   ? Pseudochol deficiency Neg Hx   ? Breast cancer Neg Hx   ? Stomach cancer Neg Hx   ? ? ?Social History  ? ?Socioeconomic History  ? Marital status: Divorced  ?  Spouse name: Not on file  ? Number of children: 1  ? Years of education: 58  ? Highest education level: High school graduate  ?Occupational History  ? Occupation: Disabled  ?Tobacco Use  ? Smoking status: Former  ?  Packs/day: 0.25  ?  Years: 30.00  ?  Pack years: 7.50  ?  Types: Cigarettes  ?  Quit date: 07/05/2021  ?  Years since quitting: 0.0  ?  Passive exposure: Past  ? Smokeless tobacco: Never  ? Tobacco comments:  ?  3-4 a day MRC 05/17/2021  ?Vaping Use  ? Vaping Use: Never used  ?Substance and Sexual Activity  ? Alcohol use: No  ?  Alcohol/week: 0.0 standard drinks  ?  Comment:    ? Drug use: No  ? Sexual activity: Not Currently  ?Other Topics Concern  ? Not on file  ?Social History Narrative  ? 01/29/18 Lives alone, has 3 aides, Mon- Fri 8 hrs, 2 hrs on Sat-Sun, RN manages her meds  ? Caffeine use: Drink coffee sometimes   ? Right handed   ? ?Social Determinants of Health  ? ?Financial Resource Strain: Low Risk   ? Difficulty of Paying Living Expenses: Not very hard  ?Food Insecurity: No Food Insecurity  ? Worried About Charity fundraiser in the Last Year: Never true  ? Ran Out of Food in the Last Year: Never true   ?Transportation Needs: No Transportation Needs  ? Lack of Transportation (Medical): No  ? Lack of Transportation (Non-Medical): No  ?Physical Activity: Inactive  ? Days of Exercise per Week: 0 days  ? Minutes of Exercis

## 2021-07-20 ENCOUNTER — Ambulatory Visit (INDEPENDENT_AMBULATORY_CARE_PROVIDER_SITE_OTHER): Payer: Medicare HMO | Admitting: Psychiatry

## 2021-07-20 DIAGNOSIS — F331 Major depressive disorder, recurrent, moderate: Secondary | ICD-10-CM

## 2021-07-20 NOTE — Progress Notes (Signed)
Virtual Visit via Telephone Note ? ?I connected with [name] on 07/20/21 at 4:05 PM  by telephone and verified that I am speaking with the correct person using two identifiers. ? ?Location: ?Patient: Home ?Provider: [facility] [place] office  ?  ?I discussed the limitations, risks, security and privacy concerns of performing an evaluation and management service by telephone and the availability of in person appointments. I also discussed with the patient that there may be a patient responsible charge related to this service. The patient expressed understanding and agreed to proceed. ? ? ? ? ?I provided 50 minutes of non-face-to-face time during this encounter. ? ? ?Athziri Freundlich E Ilithyia Titzer, LCSW ? ?   ?        ?THERAPIST PROGRESS NOTE   ? ?Session Time: Wednesday 07/20/2021  4:05 PM - 4:55 PM  ? ?Participation Level: Active ? ?Behavioral Response: depressed, talkative, alert  ? ?Type of Therapy: Individual Therapy ? ?Treatment Goals addressed: Patient will score less than 10 on the patient health questionnaire ? ?Progress on goals: Progressing ? ?Interventions: CBT and Supportive ?            ?Summary: [name] is a 71 y.o. female who presents with  long standing history of recurrent periods  of depression beginning when she was 39 and her favorite uncle died. Patient reports multiple psychiatric hospitlaizations due to depression and suicidal ideations with the last one occuring in 1997. Patient has participated in outpatient psychotherapy and medication management intermittently since age 28.  She currently is seeing psychiatrist Dr. Harrington Challenger . Prior to this, she was seen at [facility]. Patient also has had ECT at [facility]. Symptoms have worsened in recent months due to family stress and have  included depressed mood, anxiety, excessive worry, and tearfulness.           ? ?Patient's last contact was by virtual visit via telephone about 4 weeks ago.  She reports continued symptoms of depression but also  continue efforts to try to use helpful coping strategies.  She reports increased stress related to continued health issues and increased pain.  Per patient's report she recently went to the ED regarding back pain and has learned that arthritis has progressed to her spine.  She is working with pain management regarding this.  She expresses frustration she is unable to do things that she would like to do but is trying to use various strategies to cope with pain.  Patient reports listening to music, think a funny memories, and watching YouTube videos.  She also reports she has been trying to journal her feelings.  Patient also reports trying to engage in other pleasurable activities like sitting on the porch.  She continues to express frustration regarding the relationship between her son and grandson and is still having difficulty respecting the boundaries.  However, she is pleased she has had contact with her grandson since she contacted his mother.  She is looking forward to attending his graduation tomorrow and having quality time with grandson.  She expresses frustration that his father is not attending the graduation.  Patient continues to express sadness about being at home alone most of the time.  However, she does talk to 2 friends several times a day via phone and she also talks with another friend about 1-2 times a week via phone.   ? ?Suicidal/Homicidal: Nowithout intent/plan ?     ?Therapist Response: Reviewed symptoms, discussed stressors, facilitated expression of thoughts and feelings, validated feelings,  praised and reinforced patient's efforts to use helpful strategies to try to cope with pain, discussed effects, also encouraged patient to continue journaling her feelings, assisted patient identify realistic expectations regarding possible conversation with grandson, praised and reinforced patient's efforts to making contact with her friends, began to explore possible ways to have in person contact,  developed plan with patient to do identify other ways to possibly increase fellowship in preparation for next session  ? ?Diagnosis: Axis I: MDD, Recurrent, moderate ? ?  Axis II: Deferred ?Collaboration of Care: Psychiatrist AEB patient working with psychiatrist Dr. Harrington Challenger ? ?Patient/Guardian was advised Release of Information must be obtained prior to any record release in order to collaborate their care with an outside provider. Patient/Guardian was advised if they have not already done so to contact the registration department to sign all necessary forms in order for [name] to release information regarding their care.  ? ?Consent: Patient/Guardian gives verbal consent for treatment and assignment of benefits for services provided during this visit. Patient/Guardian expressed understanding and agreed to proceed.  ? ? ?Kathrina Crosley E Surina Storts, LCSW ? ? ?

## 2021-07-21 ENCOUNTER — Ambulatory Visit (HOSPITAL_COMMUNITY): Payer: Medicare HMO | Admitting: Psychiatry

## 2021-07-25 DIAGNOSIS — M65331 Trigger finger, right middle finger: Secondary | ICD-10-CM | POA: Diagnosis not present

## 2021-07-26 ENCOUNTER — Ambulatory Visit
Admission: RE | Admit: 2021-07-26 | Discharge: 2021-07-26 | Disposition: A | Discharge status: Home / Self Care | Payer: Medicare HMO | Source: Ambulatory Visit | Attending: Family Medicine | Admitting: Family Medicine

## 2021-07-26 ENCOUNTER — Ambulatory Visit: Payer: Medicare HMO

## 2021-07-26 DIAGNOSIS — E1142 Type 2 diabetes mellitus with diabetic polyneuropathy: Secondary | ICD-10-CM | POA: Diagnosis not present

## 2021-07-26 DIAGNOSIS — Z7984 Long term (current) use of oral hypoglycemic drugs: Secondary | ICD-10-CM | POA: Diagnosis not present

## 2021-07-26 DIAGNOSIS — M160 Bilateral primary osteoarthritis of hip: Secondary | ICD-10-CM | POA: Diagnosis not present

## 2021-07-26 DIAGNOSIS — F250 Schizoaffective disorder, bipolar type: Secondary | ICD-10-CM | POA: Diagnosis not present

## 2021-07-26 DIAGNOSIS — F17210 Nicotine dependence, cigarettes, uncomplicated: Secondary | ICD-10-CM | POA: Diagnosis not present

## 2021-07-26 DIAGNOSIS — N644 Mastodynia: Secondary | ICD-10-CM | POA: Diagnosis not present

## 2021-07-26 DIAGNOSIS — J42 Unspecified chronic bronchitis: Secondary | ICD-10-CM | POA: Diagnosis not present

## 2021-07-26 DIAGNOSIS — J449 Chronic obstructive pulmonary disease, unspecified: Secondary | ICD-10-CM | POA: Diagnosis not present

## 2021-07-26 DIAGNOSIS — Z794 Long term (current) use of insulin: Secondary | ICD-10-CM | POA: Diagnosis not present

## 2021-07-26 DIAGNOSIS — R2681 Unsteadiness on feet: Secondary | ICD-10-CM | POA: Diagnosis not present

## 2021-07-27 ENCOUNTER — Telehealth: Payer: Self-pay | Admitting: Family Medicine

## 2021-07-27 NOTE — Telephone Encounter (Signed)
Patient needs pre auth on nystatin (MYCOSTATIN/NYSTOP) powder  ?For [employer] ?

## 2021-07-27 NOTE — Telephone Encounter (Signed)
It is approved as of 07/27/21 ?

## 2021-07-27 NOTE — Telephone Encounter (Signed)
She has been referred to neurosurgery and also orthpoedic surgery when she was here to see gloria on 07/19/21  ?

## 2021-07-28 DIAGNOSIS — J449 Chronic obstructive pulmonary disease, unspecified: Secondary | ICD-10-CM | POA: Diagnosis not present

## 2021-07-28 DIAGNOSIS — J42 Unspecified chronic bronchitis: Secondary | ICD-10-CM | POA: Diagnosis not present

## 2021-07-28 DIAGNOSIS — F250 Schizoaffective disorder, bipolar type: Secondary | ICD-10-CM | POA: Diagnosis not present

## 2021-07-28 DIAGNOSIS — M160 Bilateral primary osteoarthritis of hip: Secondary | ICD-10-CM | POA: Diagnosis not present

## 2021-07-28 DIAGNOSIS — Z794 Long term (current) use of insulin: Secondary | ICD-10-CM | POA: Diagnosis not present

## 2021-07-28 DIAGNOSIS — Z7984 Long term (current) use of oral hypoglycemic drugs: Secondary | ICD-10-CM | POA: Diagnosis not present

## 2021-07-28 DIAGNOSIS — F17210 Nicotine dependence, cigarettes, uncomplicated: Secondary | ICD-10-CM | POA: Diagnosis not present

## 2021-07-28 DIAGNOSIS — E1142 Type 2 diabetes mellitus with diabetic polyneuropathy: Secondary | ICD-10-CM | POA: Diagnosis not present

## 2021-07-28 DIAGNOSIS — R2681 Unsteadiness on feet: Secondary | ICD-10-CM | POA: Diagnosis not present

## 2021-07-29 DIAGNOSIS — J449 Chronic obstructive pulmonary disease, unspecified: Secondary | ICD-10-CM | POA: Diagnosis not present

## 2021-07-29 DIAGNOSIS — R2681 Unsteadiness on feet: Secondary | ICD-10-CM | POA: Diagnosis not present

## 2021-07-29 DIAGNOSIS — M160 Bilateral primary osteoarthritis of hip: Secondary | ICD-10-CM | POA: Diagnosis not present

## 2021-07-29 DIAGNOSIS — E1142 Type 2 diabetes mellitus with diabetic polyneuropathy: Secondary | ICD-10-CM | POA: Diagnosis not present

## 2021-07-29 DIAGNOSIS — Z794 Long term (current) use of insulin: Secondary | ICD-10-CM | POA: Diagnosis not present

## 2021-07-29 DIAGNOSIS — J42 Unspecified chronic bronchitis: Secondary | ICD-10-CM | POA: Diagnosis not present

## 2021-07-29 DIAGNOSIS — Z7984 Long term (current) use of oral hypoglycemic drugs: Secondary | ICD-10-CM | POA: Diagnosis not present

## 2021-07-29 DIAGNOSIS — F250 Schizoaffective disorder, bipolar type: Secondary | ICD-10-CM | POA: Diagnosis not present

## 2021-07-29 DIAGNOSIS — F17210 Nicotine dependence, cigarettes, uncomplicated: Secondary | ICD-10-CM | POA: Diagnosis not present

## 2021-08-01 DIAGNOSIS — M5412 Radiculopathy, cervical region: Secondary | ICD-10-CM | POA: Diagnosis not present

## 2021-08-01 DIAGNOSIS — M4726 Other spondylosis with radiculopathy, lumbar region: Secondary | ICD-10-CM | POA: Diagnosis not present

## 2021-08-02 ENCOUNTER — Telehealth: Payer: Self-pay | Admitting: Family Medicine

## 2021-08-02 DIAGNOSIS — J449 Chronic obstructive pulmonary disease, unspecified: Secondary | ICD-10-CM | POA: Diagnosis not present

## 2021-08-02 DIAGNOSIS — Z794 Long term (current) use of insulin: Secondary | ICD-10-CM | POA: Diagnosis not present

## 2021-08-02 DIAGNOSIS — F250 Schizoaffective disorder, bipolar type: Secondary | ICD-10-CM | POA: Diagnosis not present

## 2021-08-02 DIAGNOSIS — Z7984 Long term (current) use of oral hypoglycemic drugs: Secondary | ICD-10-CM | POA: Diagnosis not present

## 2021-08-02 DIAGNOSIS — R2681 Unsteadiness on feet: Secondary | ICD-10-CM | POA: Diagnosis not present

## 2021-08-02 DIAGNOSIS — M160 Bilateral primary osteoarthritis of hip: Secondary | ICD-10-CM | POA: Diagnosis not present

## 2021-08-02 DIAGNOSIS — F17210 Nicotine dependence, cigarettes, uncomplicated: Secondary | ICD-10-CM | POA: Diagnosis not present

## 2021-08-02 DIAGNOSIS — J42 Unspecified chronic bronchitis: Secondary | ICD-10-CM | POA: Diagnosis not present

## 2021-08-02 DIAGNOSIS — E1142 Type 2 diabetes mellitus with diabetic polyneuropathy: Secondary | ICD-10-CM | POA: Diagnosis not present

## 2021-08-02 NOTE — Telephone Encounter (Signed)
Tammy w. [facility] called in on patient behalf.  Patient had OT with pain of 7/10  Patient has had no oxy waiting on PT to come in.  Call back info  [phone]  If want to put in new orders  call home office  [phone]

## 2021-08-03 DIAGNOSIS — F250 Schizoaffective disorder, bipolar type: Secondary | ICD-10-CM | POA: Diagnosis not present

## 2021-08-03 DIAGNOSIS — E1142 Type 2 diabetes mellitus with diabetic polyneuropathy: Secondary | ICD-10-CM | POA: Diagnosis not present

## 2021-08-03 DIAGNOSIS — J449 Chronic obstructive pulmonary disease, unspecified: Secondary | ICD-10-CM | POA: Diagnosis not present

## 2021-08-03 DIAGNOSIS — R2681 Unsteadiness on feet: Secondary | ICD-10-CM | POA: Diagnosis not present

## 2021-08-03 DIAGNOSIS — F17210 Nicotine dependence, cigarettes, uncomplicated: Secondary | ICD-10-CM | POA: Diagnosis not present

## 2021-08-03 DIAGNOSIS — Z7984 Long term (current) use of oral hypoglycemic drugs: Secondary | ICD-10-CM | POA: Diagnosis not present

## 2021-08-03 DIAGNOSIS — J42 Unspecified chronic bronchitis: Secondary | ICD-10-CM | POA: Diagnosis not present

## 2021-08-03 DIAGNOSIS — M160 Bilateral primary osteoarthritis of hip: Secondary | ICD-10-CM | POA: Diagnosis not present

## 2021-08-03 DIAGNOSIS — Z794 Long term (current) use of insulin: Secondary | ICD-10-CM | POA: Diagnosis not present

## 2021-08-04 ENCOUNTER — Encounter: Payer: Self-pay | Admitting: Family Medicine

## 2021-08-04 ENCOUNTER — Ambulatory Visit (INDEPENDENT_AMBULATORY_CARE_PROVIDER_SITE_OTHER): Payer: Medicare HMO | Admitting: Family Medicine

## 2021-08-04 DIAGNOSIS — F17210 Nicotine dependence, cigarettes, uncomplicated: Secondary | ICD-10-CM | POA: Diagnosis not present

## 2021-08-04 DIAGNOSIS — E1143 Type 2 diabetes mellitus with diabetic autonomic (poly)neuropathy: Secondary | ICD-10-CM | POA: Diagnosis not present

## 2021-08-04 DIAGNOSIS — Z794 Long term (current) use of insulin: Secondary | ICD-10-CM | POA: Diagnosis not present

## 2021-08-04 DIAGNOSIS — R296 Repeated falls: Secondary | ICD-10-CM

## 2021-08-04 DIAGNOSIS — J449 Chronic obstructive pulmonary disease, unspecified: Secondary | ICD-10-CM | POA: Diagnosis not present

## 2021-08-04 DIAGNOSIS — F3170 Bipolar disorder, currently in remission, most recent episode unspecified: Secondary | ICD-10-CM

## 2021-08-04 DIAGNOSIS — Z7984 Long term (current) use of oral hypoglycemic drugs: Secondary | ICD-10-CM | POA: Diagnosis not present

## 2021-08-04 DIAGNOSIS — R2681 Unsteadiness on feet: Secondary | ICD-10-CM | POA: Diagnosis not present

## 2021-08-04 DIAGNOSIS — M160 Bilateral primary osteoarthritis of hip: Secondary | ICD-10-CM | POA: Diagnosis not present

## 2021-08-04 DIAGNOSIS — J42 Unspecified chronic bronchitis: Secondary | ICD-10-CM | POA: Diagnosis not present

## 2021-08-04 DIAGNOSIS — E1142 Type 2 diabetes mellitus with diabetic polyneuropathy: Secondary | ICD-10-CM | POA: Diagnosis not present

## 2021-08-04 DIAGNOSIS — R52 Pain, unspecified: Secondary | ICD-10-CM

## 2021-08-04 DIAGNOSIS — F250 Schizoaffective disorder, bipolar type: Secondary | ICD-10-CM | POA: Diagnosis not present

## 2021-08-04 NOTE — Progress Notes (Signed)
Virtual Visit via Telephone Note  I connected with [name] on 08/04/21 at  3:40 PM EDT by telephone and verified that I am speaking with the correct person using two identifiers.  Location: Patient: home Provider: office   I discussed the limitations, risks, security and privacy concerns of performing an evaluation and management service by telephone and the availability of in person appointments. I also discussed with the patient that there may be a patient responsible charge related to this service. The patient expressed understanding and agreed to proceed.   History of Present Illness:   c/o uncontrolled generalized  pain, states feels bad because of pain, and sometimes wonders why she is being punished, has no suicidal thoughts or plans, nor homicidal plans Observations/Objective:  There were no vitals taken for this visit. Good communication with no confusion and intact memory. Alert and oriented x 3 No signs of respiratory distress during speech  Assessment and Plan: Generalized pain Uncontrolled , has upcoming appointments with orthopedics and pain specialist, also sees neurosurgery, encouraged pt to vent and to keep Specialist appointments as we are all working to help to address uncontrolled pain, she appreciated this  Episodic cigarette smoking dependence Asked:confirms currently smokes cigarettes Assess: Unwilling to set a quit date, but is cutting back Advise: needs to [place] to reduce risk of cancer, cardio and cerebrovascular disease Assist: counseled for 5 minutes and literature provided Arrange: follow up in 2 to 4 months   Recurrent falls Home safey and fall risk reduction discussed  Well controlled type 2 diabetes mellitus with gastroparesis ([place]) Managed by Endo and controlled  Bipolar disorder ([place]) Followed closely by Psychiatry , stable   Follow Up Instructions:    I discussed the assessment and treatment plan with the patient. The patient was  provided an opportunity to ask questions and all were answered. The patient agreed with the plan and demonstrated an understanding of the instructions.   The patient was advised to call back or seek an in-person evaluation if the symptoms worsen or if the condition fails to improve as anticipated.  I provided 14  minutes of non-face-to-face time during this encounter.   Tula Nakayama, MD

## 2021-08-04 NOTE — Patient Instructions (Signed)
F/U as before, call if you need me sooner  Continue to keep appointments with Your Orthopedics and pain Specialists and follow through with treatment plans.  We are all working together , trying to help you  Please stop smoking all together for improved health  Thanks for choosing [facility], we consider it a privelige to serve you.

## 2021-08-04 NOTE — Assessment & Plan Note (Addendum)
Uncontrolled , has upcoming appointments with orthopedics and pain specialist, also sees neurosurgery, encouraged pt to vent and to keep Specialist appointments as we are all working to help to address uncontrolled pain, she appreciated this

## 2021-08-07 ENCOUNTER — Encounter: Payer: Self-pay | Admitting: Family Medicine

## 2021-08-07 NOTE — Assessment & Plan Note (Signed)
Home safey and fall risk reduction discussed

## 2021-08-07 NOTE — Assessment & Plan Note (Signed)
Followed closely by Psychiatry , stable

## 2021-08-07 NOTE — Assessment & Plan Note (Signed)
Asked:confirms currently smokes cigarettes °Assess: Unwilling to set a quit date, but is cutting back °Advise: needs to QUIT to reduce risk of cancer, cardio and cerebrovascular disease °Assist: counseled for 5 minutes and literature provided °Arrange: follow up in 2 to 4 months ° °

## 2021-08-07 NOTE — Assessment & Plan Note (Signed)
Managed by Endo and controlled 

## 2021-08-09 ENCOUNTER — Telehealth: Payer: Self-pay | Admitting: Family Medicine

## 2021-08-09 DIAGNOSIS — F250 Schizoaffective disorder, bipolar type: Secondary | ICD-10-CM | POA: Diagnosis not present

## 2021-08-09 DIAGNOSIS — J42 Unspecified chronic bronchitis: Secondary | ICD-10-CM | POA: Diagnosis not present

## 2021-08-09 DIAGNOSIS — Z7984 Long term (current) use of oral hypoglycemic drugs: Secondary | ICD-10-CM | POA: Diagnosis not present

## 2021-08-09 DIAGNOSIS — J449 Chronic obstructive pulmonary disease, unspecified: Secondary | ICD-10-CM | POA: Diagnosis not present

## 2021-08-09 DIAGNOSIS — M160 Bilateral primary osteoarthritis of hip: Secondary | ICD-10-CM | POA: Diagnosis not present

## 2021-08-09 DIAGNOSIS — F17210 Nicotine dependence, cigarettes, uncomplicated: Secondary | ICD-10-CM | POA: Diagnosis not present

## 2021-08-09 DIAGNOSIS — Z794 Long term (current) use of insulin: Secondary | ICD-10-CM | POA: Diagnosis not present

## 2021-08-09 DIAGNOSIS — R2681 Unsteadiness on feet: Secondary | ICD-10-CM | POA: Diagnosis not present

## 2021-08-09 DIAGNOSIS — E1142 Type 2 diabetes mellitus with diabetic polyneuropathy: Secondary | ICD-10-CM | POA: Diagnosis not present

## 2021-08-09 NOTE — Telephone Encounter (Signed)
Fell Sunday x 3 - no inj. - weakness since then

## 2021-08-09 NOTE — Progress Notes (Unsigned)
[place], [place] [address]   CLINIC:  Medical Oncology/Hematology  PCP:  [name], MD [address], [address] [place] [address] [phone]   REASON FOR VISIT:  Follow-up for microcytic anemia/iron deficiency anemia   PRIOR THERAPY: Iron tablets   CURRENT THERAPY: Intermittent Feraheme (last on 02/19/2020)   INTERVAL HISTORY:  [name] 71 y.o. female returns for routine follow-up of her microcytic iron deficiency anemia.  She was last seen by Tarri Abernethy PA-C on 12/30/2020.   At today's visit, she reports feeling poorly due to chronic pain.   She reports occasional dark stool, with last episode several months ago..  She denies any bright red blood per rectum or hematochezia.  She has pica cravings for ice chips as well as occasional headaches.   She denies any restless legs, chest pain, or syncope.   She has dyspnea on exertion and lightheadedness.   She denies any fever, chills, or unintentional weight loss.  She has 50% energy and 75% appetite. She endorses that she is maintaining a stable weight.   REVIEW OF SYSTEMS:  Review of Systems  Constitutional:  Positive for fatigue. Negative for appetite change, chills, diaphoresis, fever and unexpected weight change.  HENT:   Positive for trouble swallowing. Negative for lump/mass and nosebleeds.   Eyes:  Negative for eye problems.  Respiratory:  Positive for shortness of breath (with exertion). Negative for cough and hemoptysis.   Cardiovascular:  Negative for chest pain, leg swelling and palpitations.  Gastrointestinal:  Positive for constipation. Negative for abdominal pain, blood in stool, diarrhea, nausea and vomiting.  Genitourinary:  Negative for hematuria.   Musculoskeletal:  Positive for arthralgias and back pain.  Skin: Negative.   Neurological:  Positive for headaches, light-headedness and numbness. Negative for dizziness.  Hematological:  Does not  bruise/bleed easily.  Psychiatric/Behavioral:  Positive for depression.      PAST MEDICAL/SURGICAL HISTORY:  Past Medical History:  Diagnosis Date   Allergy    Anemia    Anxiety    takes Ativan daily   Arthritis    Assistance needed for mobility    Bipolar disorder ([place])    takes Risperdal nightly   Blood transfusion    Brain tumor ([place])    Cancer ([place] [place])    In her gum   Carpal tunnel syndrome of right wrist 05/23/2011   Cervical disc disorder with radiculopathy of cervical region 10/31/2012   Chronic back pain    Chronic idiopathic constipation    Chronic neck and back pain    Colon polyps    COPD (chronic obstructive pulmonary disease) with chronic bronchitis ([place] [facility]) 09/16/2013   Office Spirometry 10/30/2013-submaximal effort based on appearance of loop and curve. Numbers would fit with severe restriction but her physiologic capability may be better than this. FVC 0.91/44%, and 10.74/45%, FEV1/FVC 0.81, FEF 25-75% 1.43/69%     Diabetes mellitus    Type II   Diverticulosis    TCS 9/08 by Dr. Delfin Edis for diarrhea . Bx for micro scopic colitis negative.    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   Metabolic encephalopathy 0/94/0768   Migraines    chronic headaches   Mononeuritis lower limb    Narcolepsy    Osteoporosis    Pancreatitis 2006   due to Depakote with normal EUS    Schatzki's ring    non critical / EGD with ED 8/2011with RMR   Seizures  ([place])    takes Lamictal daily.Last seizure 3 yrs ago   Sleep apnea    on CPAP   Small bowel obstruction ([facility])    Stroke ([place])    left sided weakness, speech changes   Tubular adenoma of colon    Past Surgical History:  Procedure Laterality Date   ABDOMINAL HYSTERECTOMY  1978   BACK SURGERY  July 2012   BACTERIAL OVERGROWTH TEST N/A 05/05/2013   Procedure: BACTERIAL OVERGROWTH TEST;  Surgeon: Daneil Dolin, MD;  [facility];  Service: Endoscopy;  Laterality: N/A;  7:30   BIOPSY THYROID  2009   BRAIN SURGERY  11/2011   resection of meningioma   BREAST REDUCTION SURGERY  1994   CARDIAC CATHETERIZATION  05/10/2005   normal coronaries, normal LV systolic function and EF (Dr. Jackie Plum)   [place] Left 07/22/04   Dr. Aline Brochure   CATARACT EXTRACTION Bilateral    CHOLECYSTECTOMY  1984   COLONOSCOPY N/A 09/25/2012   [id number] diverticulosis.  colonic polyp-removed : tubular adenoma   CRANIOTOMY  11/23/2011   Procedure: CRANIOTOMY TUMOR EXCISION;  Surgeon: Hosie Spangle, MD;  Location: [facility];  Service: Neurosurgery;  Laterality: N/A;  Craniotomy for tumor resection   ESOPHAGOGASTRODUODENOSCOPY  12/29/2010   [place]k-Retained food in the esophagus and stomach, small hiatal hernia, status post Maloney dilation of the esophagus   ESOPHAGOGASTRODUODENOSCOPY N/A 09/25/2012   [id number] atonic baggy esophagus status post Maloney dilation 17 F. Hiatal hernia   GIVENS CAPSULE STUDY N/A 01/15/2013   NORMAL.    IR GENERIC HISTORICAL  03/17/2016   IR RADIOLOGIST EVAL & MGMT 03/17/2016 [facility]   LESION REMOVAL N/A 05/31/2015   Procedure: REMOVAL RIGHT AND LEFT LESIONS OF MANDIBLE;  Surgeon: Diona Browner, DDS;  Location: [facility];  Service: Oral Surgery;  Laterality: N/A;   MALONEY DILATION  12/29/2010   RMR;   NM MYOCAR PERF WALL MOTION  2006   "relavtiely normal" persantine, mild anterior thinning (breast attenuation artifact), no region of scar/ischemia   OVARIAN  CYST REMOVAL     RECTOCELE REPAIR N/A 06/29/2015   Procedure: POSTERIOR REPAIR (RECTOCELE);  Surgeon: Jonnie Kind, MD;  Location: [facility];  Service: Gynecology;  Laterality: N/A;   REDUCTION MAMMAPLASTY Bilateral    SPINE SURGERY  09/29/2010   Dr. Rolena Infante   surgical excision of 3 tumors from right thigh and right buttock  and left upper thigh  2010   TOOTH EXTRACTION Bilateral 12/14/2014   Procedure: REMOVAL OF BILATERAL MANDIBULAR EXOSTOSES;  Surgeon: Diona Browner, DDS;  Location: [facility];  Service: Oral Surgery;  Laterality: Bilateral;   TRANSTHORACIC ECHOCARDIOGRAM  2010   EF 60-65%, mild conc LVH, grade 1 diastolic dysfunction; mildly calcified MV annulus with mildly thickened leaflets, mildly calcified MR annulus     SOCIAL HISTORY:  Social History   Socioeconomic History   Marital status:  Divorced    Spouse name: Not on file   Number of children: 1   Years of education: 4   Highest education level: High school graduate  Occupational History   Occupation: Disabled  Tobacco Use   Smoking status: Former    Packs/day: 0.25    Years: 30.00    Pack years: 7.50    Types: Cigarettes    Quit date: 07/05/2021    Years since quitting: 0.0    Passive exposure: Past   Smokeless tobacco: Never   Tobacco comments:    3-4 a day MRC 05/17/2021  Vaping Use   Vaping Use: Never used  Substance and Sexual Activity   Alcohol use: No    Alcohol/week: 0.0 standard drinks    Comment:     Drug use: No   Sexual activity: Not Currently  Other Topics Concern   Not on file  Social History Narrative   01/29/18 Lives alone, has 3 aides, Mon- Fri 8 hrs, 2 hrs on Sat-Sun, RN manages her meds   Caffeine use: Drink coffee sometimes    Right handed    Social Determinants of Radio broadcast assistant Strain: Low Risk    Difficulty of Paying Living Expenses: Not very hard  Food Insecurity: No Food Insecurity   Worried About Charity fundraiser in the Last Year: Never true   [place] in  the Last Year: Never true  Transportation Needs: No Transportation Needs   Lack of Transportation (Medical): No   Lack of Transportation (Non-Medical): No  Physical Activity: Inactive   Days of Exercise per Week: 0 days   Minutes of Exercise per Session: 0 min  Stress: No Stress Concern Present   Feeling of Stress : Only a little  Social Connections: Moderately Isolated   Frequency of Communication with Friends and Family: Three times a week   Frequency of Social Gatherings with Friends and Family: Twice a week   Attends Religious Services: More than 4 times per year   Active Member of [employer] or Organizations: No   Attends Music therapist: Never   Marital Status: Divorced  Human resources officer Violence: Not on file    FAMILY HISTORY:  Family History  Problem Relation Age of Onset   Heart attack Mother        HTN   Pneumonia Father    Kidney failure Father    Diabetes Father    Pancreatic cancer Sister    Cancer Sister        breast    Cancer Sister        pancreatic   Diabetes Brother    Hypertension Brother    Diabetes Brother    Alcohol abuse Maternal Uncle    Stroke Maternal Grandmother    Heart attack Maternal Grandfather    Hypertension Son    Sleep apnea Son    Colon cancer Neg Hx    Anesthesia problems Neg Hx    Hypotension Neg Hx    Malignant hyperthermia Neg Hx    Pseudochol deficiency Neg Hx    Breast cancer Neg Hx    Stomach cancer Neg Hx     CURRENT MEDICATIONS:  Outpatient Encounter Medications as of 08/10/2021  Medication Sig Note   ACCU-CHEK GUIDE test strip TEST BLOOD SUGAR FOUR TIMES DAILY  AND AS NEEDED    Accu-Chek Softclix Lancets lancets TEST BLOOD SUGAR THREE TIMES DAILY AS DIRECTED    albuterol (VENTOLIN HFA) 108 (90 Base) MCG/ACT  inhaler INHALE 1 TO 2 PUFFS EVERY 6 HOURS AS NEEDED FOR WHEEZING, SHORTNESS OF BREATH    Alcohol Swabs (DROPSAFE ALCOHOL PREP) 70 % PADS USE TO CLEAN FINGER PRIOR TO TESTING FOR BLOOD SUGAR  AS DIRECTED     alendronate (FOSAMAX) 70 MG tablet TAKE 1 TABLET (70 MG TOTAL) BY MOUTH EVERY 7 (SEVEN) DAYS. TAKE WITH A FULL GLASS OF WATER ON AN EMPTY STOMACH.    amLODipine (NORVASC) 10 MG tablet TAKE 1 TABLET EVERY DAY    ascorbic acid (VITAMIN C) 500 MG tablet Take 500 mg by mouth daily.    aspirin EC 81 MG tablet Take 1 tablet (81 mg total) by mouth daily with breakfast.    betamethasone dipropionate 0.05 % cream     Blood Glucose Calibration (ACCU-CHEK GUIDE CONTROL) LIQD USE AS DIRECTED    blood glucose meter kit and supplies Dispense based on patient and insurance preference. Use up to four times daily as directed. (FOR ICD-10 E10.9, E11.9).    buPROPion (WELLBUTRIN XL) 150 MG 24 hr tablet Take 1 tablet (150 mg total) by mouth every morning.    Calcium Carb-Cholecalciferol (CALTRATE 600+D3 SOFT) 600-20 MG-MCG CHEW Chew 1 tablet by mouth daily at 12 noon.    cetirizine (ZYRTEC) 10 MG tablet Take 1 tablet (10 mg total) by mouth daily.    Cholecalciferol 50 MCG (2000 UT) TABS Take 1 tablet by mouth daily.    Clobetasol Prop Emollient Base (CLOBETASOL PROPIONATE E) 0.05 % emollient cream Apply to affected area qd    Continuous Blood Gluc Sensor (FREESTYLE LIBRE 14 DAY SENSOR) MISC 1 each by Does not apply route every 14 (fourteen) days. Change every 2 weeks    diclofenac Sodium (VOLTAREN) 1 % GEL APPLY 2 GRAMS TOPICALLY FOUR TIMES DAILY.    Docusate Sodium (DSS) 100 MG CAPS Take by mouth.    DROPLET PEN NEEDLES 31G X 8 MM MISC USE FOR INJECTING INSULIN 4 TIMES DAILY.    ezetimibe (ZETIA) 10 MG tablet TAKE 1 TABLET EVERY DAY    famotidine (PEPCID) 40 MG tablet Take 40 mg by mouth 2 (two) times daily.    hydroxychloroquine (PLAQUENIL) 200 MG tablet Take by mouth.    hydrOXYzine (ATARAX/VISTARIL) 10 MG tablet Take 1 tablet (10 mg total) by mouth 3 (three) times daily as needed.    insulin glargine, 2 Unit Dial, (TOUJEO MAX SOLOSTAR) 300 UNIT/ML Solostar Pen Inject 32 Units into the skin at bedtime.     JARDIANCE 10 MG TABS tablet Take 10 mg by mouth every morning.    lamoTRIgine (LAMICTAL) 100 MG tablet Take 1 tablet (100 mg total) by mouth 2 (two) times daily.    levothyroxine (SYNTHROID) 50 MCG tablet TAKE 1 TABLET EVERY DAY FOR 6 DAYS PER WEEK, 1/2 TABLET 1 DAY PER WEEK    LORazepam (ATIVAN) 0.5 MG tablet Take 1 tablet (0.5 mg total) by mouth 2 (two) times daily.    losartan (COZAAR) 50 MG tablet TAKE 1 TABLET EVERY DAY (Patient taking differently: Take 100 mg by mouth.)    meclizine (ANTIVERT) 25 MG tablet Take 1 tablet (25 mg total) by mouth 3 (three) times daily as needed for dizziness.    methocarbamol (ROBAXIN) 500 MG tablet Take one tablet by mouth two times daily , as needed, for neck and back pain and spasm    methylPREDNISolone (MEDROL DOSEPAK) 4 MG TBPK tablet Take as prescribed (Patient not taking: Reported on 08/04/2021)    metoprolol tartrate (LOPRESSOR) 50 MG  tablet TAKE 1 TABLET TWICE DAILY (NEED MD APPOINTMENT) (Patient taking differently: Take 50 mg by mouth 2 (two) times daily.)    metroNIDAZOLE (FLAGYL) 250 MG tablet Take 1 tablet (250 mg total) by mouth 3 (three) times daily.    mirabegron ER (MYRBETRIQ) 25 MG TB24 tablet Take 1 tablet (25 mg total) by mouth daily.    Misc. Devices (MATTRESS PAD) MISC FIRM MATTRESS PAD for hospital bed x 1    montelukast (SINGULAIR) 10 MG tablet TAKE 1 TABLET EVERY DAY    Multiple Vitamins-Minerals (MULTIVITAMIN GUMMIES ADULT PO) Take 1 tablet by mouth daily.    Nicotine (NICOTROL NS) 10 MG/ML SOLN 1-5x/ hr x 8 weeks, then taper over 4-6 weeks to dc    NOVOLOG FLEXPEN 100 UNIT/ML FlexPen INJECT 16 TO 18 UNITS UNDER SKIN UP TO 3 TIMES A DAY BEFORE MEALS  (DISCARD PEN 28 DAYS AFTER OPENING) 06/02/2021: Patient taking 14 units with small meals, 17 units with large meals   nystatin (MYCOSTATIN/NYSTOP) powder APPLY TO AFFECTED AREA 4 TIMES DAILY.    Omega-3 Fatty Acids (FISH OIL PO) Take 1 capsule by mouth daily.    ondansetron (ZOFRAN) 4 MG  tablet Take 1 tablet (4 mg total) by mouth every 8 (eight) hours as needed for nausea or vomiting.    oxyCODONE-acetaminophen (PERCOCET) 10-325 MG tablet Take 1 tablet by mouth every 8 (eight) hours as needed for pain. 03/24/2021: Patient only using twice daily as needed    Plecanatide (TRULANCE) 3 MG TABS Take 3 mg by mouth daily as needed.    pregabalin (LYRICA) 75 MG capsule Take 1 capsule (75 mg total) by mouth daily.    RABEprazole (ACIPHEX) 20 MG tablet Take 1 tablet (20 mg total) by mouth 2 (two) times daily.    RESTASIS 0.05 % ophthalmic emulsion Place 2 drops into both eyes daily.    risperiDONE (RISPERDAL) 0.5 MG tablet Take 1 tablet (0.5 mg total) by mouth at bedtime.    sertraline (ZOLOFT) 100 MG tablet Take 1 tablet (100 mg total) by mouth every morning.    spironolactone (ALDACTONE) 50 MG tablet Take 1 tablet (50 mg total) by mouth daily.    STIOLTO RESPIMAT 2.5-2.5 MCG/ACT AERS INHALE 2 PUFFS INTO THE LUNGS DAILY. (Patient taking differently: Inhale 2 puffs into the lungs daily.)    traZODone (DESYREL) 150 MG tablet Take 1 tablet (150 mg total) by mouth at bedtime.    UNABLE TO [facility] bed mattress x 1  DX: G47.33, J44.9    UNABLE TO FIND Standard wheelchair Dx M47.16, R29.898    UNABLE TO FIND Med Name: Activice cooling gel spray on arms, legs, and back    UNABLE TO [facility] bed with orthopedic mattress x 1 DX G47.33, G89.4    No facility-administered encounter medications on file as of 08/10/2021.    ALLERGIES:  Allergies  Allergen Reactions   Iron Nausea And Vomiting    And itching And itching   Lac Bovis Rash    Doesn't agree with stomach.  Doesn't agree with stomach.  Doesn't agree with stomach.    Penicillins Hives    Has patient had a PCN reaction causing immediate rash, facial/tongue/throat swelling, SOB or lightheadedness with hypotension: Yes Has patient had a PCN reaction causing severe rash involving mucus membranes or skin necrosis: No Has  patient had a PCN reaction that required hospitalization No Has patient had a PCN reaction occurring within the last 10 years: No If all of the above  answers are "NO", then may proceed with Cephalosporin use.  Other reaction(s): Other (see comments) Has patient had a PCN reaction causing immediate rash, facial/tongue/throat swelling, SOB or lightheadedness with hypotension: Yes Has patient had a PCN reaction causing severe rash involving mucus membranes or skin necrosis: No Has patient had a PCN reaction that required hospitalization No Has patient had a PCN reaction occurring within the last 10 years: No If all of the above answers are "NO", then may proceed with Cephalosporin use. Has patient had a PCN reaction causing immediate rash, facial/tongue/throat swelling, SOB or lightheadedness with hypotension: Yes Has patient had a PCN reaction causing severe rash involving mucus membranes or skin necrosis: No Has patient had a PCN reaction that required hospitalization No Has patient had a PCN reaction occurring within the last 10 years: No If all of the above answers are "NO", then may proceed with Cephalosporin use. Has patient had a PCN reaction causing immediate rash, facial/tongue/throat swelling, SOB or lightheadedness with hypotension: Yes Has patient had a PCN reaction causing severe rash involving mucus membranes or skin necrosis: No Has patient had a PCN reaction that required hospitalization No Has patient had a PCN reaction occurring within the last 10 years: No If all of the above answers are "NO", then may proceed with Cephalosporin use. Has patient had a PCN reaction causing immediate rash, facial/tongue/throat swelling, SOB or lightheadedness with hypotension: Yes Has patient had a PCN reaction causing sever... (TRUNCATED)   Phenazopyridine Hives   Phenazopyridine Hcl Hives   Cephalexin Hives   Flonase [Fluticasone]     "It gave me ulcers in my nose"   Milk-Related Compounds  Other (See Comments)    Doesn't agree with stomach.    Phenazopyridine Hcl Hives           PHYSICAL EXAM:  ECOG PERFORMANCE STATUS: 2 - Symptomatic, <50% confined to bed  There were no vitals filed for this visit. There were no vitals filed for this visit. Physical Exam Constitutional:      Appearance: Normal appearance. She is obese.  HENT:     Head: Normocephalic and atraumatic.     Mouth/Throat:     Mouth: Mucous membranes are moist.  Eyes:     Extraocular Movements: Extraocular movements intact.     Pupils: Pupils are equal, round, and reactive to light.  Cardiovascular:     Rate and Rhythm: Normal rate and regular rhythm.     Pulses: Normal pulses.     Heart sounds: Normal heart sounds.  Pulmonary:     Effort: Pulmonary effort is normal.     Breath sounds: Normal breath sounds.     Comments: Decreased breath sounds, auscultation limited by body habitus Abdominal:     General: Bowel sounds are normal.     Palpations: Abdomen is soft.     Tenderness: There is no abdominal tenderness.  Musculoskeletal:        General: No swelling.     Right lower leg: No edema.     Left lower leg: No edema.  Lymphadenopathy:     Cervical: No cervical adenopathy.  Skin:    General: Skin is warm and dry.  Neurological:     General: No focal deficit present.     Mental Status: She is alert and oriented to person, place, and time.     Motor: Weakness (Chronic right hand hemiparesis) present.  Psychiatric:        Mood and Affect: Mood normal.  Behavior: Behavior normal.     LABORATORY DATA:  I have reviewed the labs as listed.  CBC    Component Value Date/Time   WBC 5.8 07/07/2021 1204   RBC 4.16 07/07/2021 1204   HGB 9.4 (L) 07/07/2021 1204   HGB 10.8 (L) 11/18/2020 1057   HCT 32.9 (L) 07/07/2021 1204   HCT 37.9 11/18/2020 1057   PLT 225 07/07/2021 1204   PLT 253 11/18/2020 1057   MCV 79.1 (L) 07/07/2021 1204   MCV 77 (L) 11/18/2020 1057   MCH 22.6 (L) 07/07/2021  1204   MCHC 28.6 (L) 07/07/2021 1204   RDW 14.8 07/07/2021 1204   RDW 13.1 11/18/2020 1057   LYMPHSABS 1.9 07/07/2021 1204   LYMPHSABS 2.1 11/18/2020 1057   MONOABS 0.5 07/07/2021 1204   EOSABS 0.1 07/07/2021 1204   EOSABS 0.2 11/18/2020 1057   BASOSABS 0.0 07/07/2021 1204   BASOSABS 0.0 11/18/2020 1057      Latest Ref Rng & Units 07/07/2021   12:04 PM 05/10/2021   11:37 AM 12/23/2020   12:44 PM  CMP  Glucose 70 - 99 mg/dL 66   51   110    BUN 8 - 23 mg/dL 23   29   29     Creatinine 0.44 - 1.00 mg/dL 1.26   1.13   1.23    Sodium 135 - 145 mmol/L 143   143   142    Potassium 3.5 - 5.1 mmol/L 3.9   4.7   4.4    Chloride 98 - 111 mmol/L 111   109   112    CO2 22 - 32 mmol/L 27   23   25     Calcium 8.9 - 10.3 mg/dL 9.1   9.3   9.3    Total Protein 6.5 - 8.1 g/dL 6.8   6.6   7.1    Total Bilirubin 0.3 - 1.2 mg/dL 0.4   <0.2   0.6    Alkaline Phos 38 - 126 U/L 65   89   78    AST 15 - 41 U/L 25   21   20     ALT 0 - 44 U/L 24   24   24       DIAGNOSTIC IMAGING:  I have independently reviewed the relevant imaging and discussed with the patient.  ASSESSMENT & PLAN: 1.  Microcytic anemia - This is a combination anemia from functional iron deficiency (in the setting of chronic disease including COPD, type 2 diabetes, fibromyalgia, hypertension, and multiple other comorbidities listed in history) and chronic kidney disease stage III. - SPEP checked in 2019 was normal. - Colonoscopy (11/04/2020): Multiple diverticula, otherwise normal - EGD (11/04/2020): Normal esophagus, gastritis, gastric lipoma - She was previously placed on iron pills, but could not tolerate them due to constipation. - Last Feraheme on 02/12/2020 on 02/19/2020. - She denies any bleeding per rectum or melena, but does have occasional dark stools  - Most recent labs (07/07/2021): Hgb 9.4/MCV 79.1, ferritin 256, iron saturation 16%.  LDH normal.  Baseline creatinine 1.26/GFR 46.  Normal folate, copper, vitamin B12. - PLAN:  Recommend IV Venofer 300 mg x 2 doses due to functional anemia (saturation <20%) in the setting of anemia of CKD.  (**PREMEDICATE with steroids and Zofran due to history of mouth swelling, itching, and nausea after Feraheme in October 2020) - Repeat labs and RTC in 8 weeks (CBC, CMP, iron panel) -If she continues to have persistent Hgb < 10.0 in  the setting of CKD stage III, we will start her on Retacrit 10,000 units every 2 weeks at her new visit (will require prior authorization) - She follows with Dr. Theador Hawthorne for her CKD stage IIIa   PLAN SUMMARY & DISPOSITION: Venofer 300 mg x 2 Same-day labs and office visit in 8 weeks  All questions were answered. The patient knows to call the clinic with any problems, questions or concerns.  Medical decision making: Moderate  Time spent on visit: I spent 20 minutes counseling the patient face to face. The total time spent in the appointment was 30 minutes and more than 50% was on counseling.   Harriett Rush, PA-C  08/10/2021 1:51 PM

## 2021-08-10 ENCOUNTER — Encounter (HOSPITAL_COMMUNITY): Payer: Self-pay | Admitting: Physician Assistant

## 2021-08-10 ENCOUNTER — Inpatient Hospital Stay (HOSPITAL_COMMUNITY): Payer: Medicare HMO | Attending: Hematology | Admitting: Physician Assistant

## 2021-08-10 VITALS — BP 164/68 | HR 69 | Temp 96.4°F | Resp 18 | Ht 59.84 in | Wt 167.1 lb

## 2021-08-10 DIAGNOSIS — N1831 Chronic kidney disease, stage 3a: Secondary | ICD-10-CM | POA: Diagnosis not present

## 2021-08-10 DIAGNOSIS — D631 Anemia in chronic kidney disease: Secondary | ICD-10-CM | POA: Diagnosis not present

## 2021-08-10 DIAGNOSIS — Z794 Long term (current) use of insulin: Secondary | ICD-10-CM | POA: Diagnosis not present

## 2021-08-10 DIAGNOSIS — R5383 Other fatigue: Secondary | ICD-10-CM | POA: Insufficient documentation

## 2021-08-10 DIAGNOSIS — Z79899 Other long term (current) drug therapy: Secondary | ICD-10-CM | POA: Insufficient documentation

## 2021-08-10 DIAGNOSIS — Z800 Family history of malignant neoplasm of digestive organs: Secondary | ICD-10-CM | POA: Diagnosis not present

## 2021-08-10 DIAGNOSIS — M160 Bilateral primary osteoarthritis of hip: Secondary | ICD-10-CM

## 2021-08-10 DIAGNOSIS — F250 Schizoaffective disorder, bipolar type: Secondary | ICD-10-CM | POA: Diagnosis not present

## 2021-08-10 DIAGNOSIS — M549 Dorsalgia, unspecified: Secondary | ICD-10-CM | POA: Diagnosis not present

## 2021-08-10 DIAGNOSIS — D509 Iron deficiency anemia, unspecified: Secondary | ICD-10-CM

## 2021-08-10 DIAGNOSIS — D649 Anemia, unspecified: Secondary | ICD-10-CM

## 2021-08-10 DIAGNOSIS — E119 Type 2 diabetes mellitus without complications: Secondary | ICD-10-CM | POA: Insufficient documentation

## 2021-08-10 DIAGNOSIS — J42 Unspecified chronic bronchitis: Secondary | ICD-10-CM

## 2021-08-10 DIAGNOSIS — E039 Hypothyroidism, unspecified: Secondary | ICD-10-CM | POA: Diagnosis not present

## 2021-08-10 DIAGNOSIS — J449 Chronic obstructive pulmonary disease, unspecified: Secondary | ICD-10-CM | POA: Diagnosis not present

## 2021-08-10 DIAGNOSIS — K219 Gastro-esophageal reflux disease without esophagitis: Secondary | ICD-10-CM | POA: Diagnosis not present

## 2021-08-10 DIAGNOSIS — I10 Essential (primary) hypertension: Secondary | ICD-10-CM

## 2021-08-10 DIAGNOSIS — Z7984 Long term (current) use of oral hypoglycemic drugs: Secondary | ICD-10-CM | POA: Diagnosis not present

## 2021-08-10 DIAGNOSIS — E785 Hyperlipidemia, unspecified: Secondary | ICD-10-CM | POA: Insufficient documentation

## 2021-08-10 DIAGNOSIS — Z803 Family history of malignant neoplasm of breast: Secondary | ICD-10-CM | POA: Insufficient documentation

## 2021-08-10 DIAGNOSIS — K5730 Diverticulosis of large intestine without perforation or abscess without bleeding: Secondary | ICD-10-CM

## 2021-08-10 DIAGNOSIS — R296 Repeated falls: Secondary | ICD-10-CM

## 2021-08-10 DIAGNOSIS — M129 Arthropathy, unspecified: Secondary | ICD-10-CM | POA: Insufficient documentation

## 2021-08-10 DIAGNOSIS — F17210 Nicotine dependence, cigarettes, uncomplicated: Secondary | ICD-10-CM

## 2021-08-10 DIAGNOSIS — D638 Anemia in other chronic diseases classified elsewhere: Secondary | ICD-10-CM | POA: Insufficient documentation

## 2021-08-10 DIAGNOSIS — E1142 Type 2 diabetes mellitus with diabetic polyneuropathy: Secondary | ICD-10-CM | POA: Diagnosis not present

## 2021-08-10 DIAGNOSIS — K389 Disease of appendix, unspecified: Secondary | ICD-10-CM

## 2021-08-10 DIAGNOSIS — R531 Weakness: Secondary | ICD-10-CM

## 2021-08-10 DIAGNOSIS — R2681 Unsteadiness on feet: Secondary | ICD-10-CM

## 2021-08-10 HISTORY — DX: Anemia in chronic kidney disease: D63.1

## 2021-08-10 NOTE — Patient Instructions (Signed)
[name] at [facility] Discharge Instructions  You were seen today by Tarri Abernethy PA-C for your anemia.  Your blood and iron levels are still lower than normal.  We will give you IV iron x2 doses.  If this does not improve your blood levels, we will start you on Retacrit injections.  FOLLOW-UP APPOINTMENT: Same-day labs and office visit in 8 weeks   Thank you for choosing [place] at [facility] to provide your oncology and hematology care.  To afford each patient quality time with our provider, please arrive at least 15 minutes before your scheduled appointment time.   If you have a lab appointment with the [facility] please come in thru the Main Entrance and check in at the main information desk.  You need to re-schedule your appointment should you arrive 10 or more minutes late.  We strive to give you quality time with our providers, and arriving late affects you and other patients whose appointments are after yours.  Also, if you no show three or more times for appointments you may be dismissed from the clinic at the providers discretion.     Again, thank you for choosing [facility].  Our hope is that these requests will decrease the amount of time that you wait before being seen by our physicians.       _____________________________________________________________  Should you have questions after your visit to [facility], please contact our office at [phone] and follow the prompts.  Our office hours are 8:00 a.m. and 4:30 p.m. Monday - Friday.  Please note that voicemails left after 4:00 p.m. may not be returned until the following business day.  We are closed weekends and major holidays.  You do have access to a nurse 24-7, just call the main number to the clinic [phone] and do not press any options, hold on the line and a nurse will answer the phone.    For prescription refill requests, have  your pharmacy contact our office and allow 72 hours.    Due to Covid, you will need to wear a mask upon entering the hospital. If you do not have a mask, a mask will be given to you at the Main Entrance upon arrival. For doctor visits, patients may have 1 support person age 49 or older with them. For treatment visits, patients can not have anyone with them due to social distancing guidelines and our immunocompromised population.

## 2021-08-10 NOTE — Telephone Encounter (Signed)
Does she want a visit/telephone visit?

## 2021-08-10 NOTE — Telephone Encounter (Signed)
Left voicemail to call our office.

## 2021-08-11 ENCOUNTER — Ambulatory Visit (HOSPITAL_COMMUNITY): Payer: Medicare HMO

## 2021-08-11 DIAGNOSIS — M25512 Pain in left shoulder: Secondary | ICD-10-CM | POA: Diagnosis not present

## 2021-08-12 DIAGNOSIS — Z794 Long term (current) use of insulin: Secondary | ICD-10-CM | POA: Diagnosis not present

## 2021-08-12 DIAGNOSIS — Z7984 Long term (current) use of oral hypoglycemic drugs: Secondary | ICD-10-CM | POA: Diagnosis not present

## 2021-08-12 DIAGNOSIS — F250 Schizoaffective disorder, bipolar type: Secondary | ICD-10-CM | POA: Diagnosis not present

## 2021-08-12 DIAGNOSIS — E1142 Type 2 diabetes mellitus with diabetic polyneuropathy: Secondary | ICD-10-CM | POA: Diagnosis not present

## 2021-08-12 DIAGNOSIS — J42 Unspecified chronic bronchitis: Secondary | ICD-10-CM | POA: Diagnosis not present

## 2021-08-12 DIAGNOSIS — M160 Bilateral primary osteoarthritis of hip: Secondary | ICD-10-CM | POA: Diagnosis not present

## 2021-08-12 DIAGNOSIS — F17210 Nicotine dependence, cigarettes, uncomplicated: Secondary | ICD-10-CM | POA: Diagnosis not present

## 2021-08-12 DIAGNOSIS — J449 Chronic obstructive pulmonary disease, unspecified: Secondary | ICD-10-CM | POA: Diagnosis not present

## 2021-08-12 DIAGNOSIS — R2681 Unsteadiness on feet: Secondary | ICD-10-CM | POA: Diagnosis not present

## 2021-08-15 DIAGNOSIS — Z7984 Long term (current) use of oral hypoglycemic drugs: Secondary | ICD-10-CM | POA: Diagnosis not present

## 2021-08-15 DIAGNOSIS — R2681 Unsteadiness on feet: Secondary | ICD-10-CM | POA: Diagnosis not present

## 2021-08-15 DIAGNOSIS — J449 Chronic obstructive pulmonary disease, unspecified: Secondary | ICD-10-CM | POA: Diagnosis not present

## 2021-08-15 DIAGNOSIS — Z794 Long term (current) use of insulin: Secondary | ICD-10-CM | POA: Diagnosis not present

## 2021-08-15 DIAGNOSIS — F250 Schizoaffective disorder, bipolar type: Secondary | ICD-10-CM | POA: Diagnosis not present

## 2021-08-15 DIAGNOSIS — J42 Unspecified chronic bronchitis: Secondary | ICD-10-CM | POA: Diagnosis not present

## 2021-08-15 DIAGNOSIS — E1142 Type 2 diabetes mellitus with diabetic polyneuropathy: Secondary | ICD-10-CM | POA: Diagnosis not present

## 2021-08-15 DIAGNOSIS — M160 Bilateral primary osteoarthritis of hip: Secondary | ICD-10-CM | POA: Diagnosis not present

## 2021-08-15 DIAGNOSIS — F17210 Nicotine dependence, cigarettes, uncomplicated: Secondary | ICD-10-CM | POA: Diagnosis not present

## 2021-08-16 ENCOUNTER — Telehealth: Payer: Self-pay | Admitting: Family Medicine

## 2021-08-16 ENCOUNTER — Inpatient Hospital Stay (HOSPITAL_COMMUNITY): Payer: Medicare HMO | Attending: Hematology

## 2021-08-16 VITALS — BP 137/62 | HR 60 | Temp 97.9°F | Resp 18

## 2021-08-16 DIAGNOSIS — Z79899 Other long term (current) drug therapy: Secondary | ICD-10-CM | POA: Diagnosis not present

## 2021-08-16 DIAGNOSIS — Z7984 Long term (current) use of oral hypoglycemic drugs: Secondary | ICD-10-CM | POA: Diagnosis not present

## 2021-08-16 DIAGNOSIS — D631 Anemia in chronic kidney disease: Secondary | ICD-10-CM | POA: Diagnosis not present

## 2021-08-16 DIAGNOSIS — F250 Schizoaffective disorder, bipolar type: Secondary | ICD-10-CM | POA: Diagnosis not present

## 2021-08-16 DIAGNOSIS — N1831 Chronic kidney disease, stage 3a: Secondary | ICD-10-CM | POA: Insufficient documentation

## 2021-08-16 DIAGNOSIS — I129 Hypertensive chronic kidney disease with stage 1 through stage 4 chronic kidney disease, or unspecified chronic kidney disease: Secondary | ICD-10-CM | POA: Diagnosis not present

## 2021-08-16 DIAGNOSIS — J42 Unspecified chronic bronchitis: Secondary | ICD-10-CM | POA: Diagnosis not present

## 2021-08-16 DIAGNOSIS — Z794 Long term (current) use of insulin: Secondary | ICD-10-CM | POA: Diagnosis not present

## 2021-08-16 DIAGNOSIS — R2681 Unsteadiness on feet: Secondary | ICD-10-CM | POA: Diagnosis not present

## 2021-08-16 DIAGNOSIS — J449 Chronic obstructive pulmonary disease, unspecified: Secondary | ICD-10-CM | POA: Diagnosis not present

## 2021-08-16 DIAGNOSIS — F17210 Nicotine dependence, cigarettes, uncomplicated: Secondary | ICD-10-CM | POA: Diagnosis not present

## 2021-08-16 DIAGNOSIS — M160 Bilateral primary osteoarthritis of hip: Secondary | ICD-10-CM | POA: Diagnosis not present

## 2021-08-16 DIAGNOSIS — E1142 Type 2 diabetes mellitus with diabetic polyneuropathy: Secondary | ICD-10-CM | POA: Diagnosis not present

## 2021-08-16 DIAGNOSIS — D509 Iron deficiency anemia, unspecified: Secondary | ICD-10-CM

## 2021-08-16 MED ORDER — SODIUM CHLORIDE 0.9 % IV SOLN: Freq: Once | INTRAVENOUS | Status: AC

## 2021-08-16 MED ORDER — METHYLPREDNISOLONE SODIUM SUCC 125 MG IJ SOLR
125.0000 mg | Freq: Once | INTRAMUSCULAR | Status: AC
  Administered 2021-08-16: 125 mg via INTRAVENOUS
  Filled 2021-08-16: qty 2

## 2021-08-16 MED ORDER — ACETAMINOPHEN 325 MG PO TABS
650.0000 mg | ORAL_TABLET | Freq: Once | ORAL | Status: AC
  Administered 2021-08-16: 650 mg via ORAL
  Filled 2021-08-16: qty 2

## 2021-08-16 MED ORDER — LORATADINE 10 MG PO TABS
10.0000 mg | ORAL_TABLET | Freq: Once | ORAL | Status: AC
  Administered 2021-08-16: 10 mg via ORAL
  Filled 2021-08-16: qty 1

## 2021-08-16 MED ORDER — FAMOTIDINE IN NACL 20-0.9 MG/50ML-% IV SOLN
20.0000 mg | Freq: Once | INTRAVENOUS | Status: AC
  Administered 2021-08-16: 20 mg via INTRAVENOUS
  Filled 2021-08-16: qty 50

## 2021-08-16 MED ORDER — SODIUM CHLORIDE 0.9 % IV SOLN
300.0000 mg | Freq: Once | INTRAVENOUS | Status: AC
  Administered 2021-08-16: 300 mg via INTRAVENOUS
  Filled 2021-08-16: qty 300

## 2021-08-16 NOTE — Progress Notes (Signed)
Treatment given per orders. Patient tolerated it well without problems. Vitals stable and discharged home from clinic via wheelchair Follow up as scheduled.  

## 2021-08-16 NOTE — Telephone Encounter (Signed)
Amy with [facility], [phone] option 1 for peer-to-peer. Order has to be completed by 1pm tomorrow if you would like to give a call. They are needing to do a peer-to-peer for more documentation in order to approve additional visits for home health.

## 2021-08-16 NOTE — Patient Instructions (Signed)
[facility]  Discharge Instructions: Thank you for choosing [facility] to provide your oncology and hematology care.  If you have a lab appointment with the Cancer Center, please come in thru the Main Entrance and check in at the main information desk.  Wear comfortable clothing and clothing appropriate for easy access to any Portacath or PICC line.   We strive to give you quality time with your provider. You may need to reschedule your appointment if you arrive late (15 or more minutes).  Arriving late affects you and other patients whose appointments are after yours.  Also, if you miss three or more appointments without notifying the office, you may be dismissed from the clinic at the provider's discretion.      For prescription refill requests, have your pharmacy contact our office and allow 72 hours for refills to be completed.        To help prevent nausea and vomiting after your treatment, we encourage you to take your nausea medication as directed.  BELOW ARE SYMPTOMS THAT SHOULD BE REPORTED IMMEDIATELY: *FEVER GREATER THAN 100.4 F (38 C) OR HIGHER *CHILLS OR SWEATING *NAUSEA AND VOMITING THAT IS NOT CONTROLLED WITH YOUR NAUSEA MEDICATION *UNUSUAL SHORTNESS OF BREATH *UNUSUAL BRUISING OR BLEEDING *URINARY PROBLEMS (pain or burning when urinating, or frequent urination) *BOWEL PROBLEMS (unusual diarrhea, constipation, pain near the anus) TENDERNESS IN MOUTH AND THROAT WITH OR WITHOUT PRESENCE OF ULCERS (sore throat, sores in mouth, or a toothache) UNUSUAL RASH, SWELLING OR PAIN  UNUSUAL VAGINAL DISCHARGE OR ITCHING   Items with * indicate a potential emergency and should be followed up as soon as possible or go to the Emergency Department if any problems should occur.  Please show the CHEMOTHERAPY ALERT CARD or IMMUNOTHERAPY ALERT CARD at check-in to the Emergency Department and triage nurse.  Should you have questions after your visit or need to cancel  or reschedule your appointment, please contact [facility] [phone]  and follow the prompts.  Office hours are 8:00 a.m. to 4:30 p.m. Monday - Friday. Please note that voicemails left after 4:00 p.m. may not be returned until the following business day.  We are closed weekends and major holidays. You have access to a nurse at all times for urgent questions. Please call the main number to the clinic [phone] and follow the prompts.  For any non-urgent questions, you may also contact your provider using MyChart. We now offer e-Visits for anyone 18 and older to request care online for non-urgent symptoms. For details visit [email or web address].   Also download the MyChart app! Go to the app store, search "MyChart", open the app, select [facility], and log in with your MyChart username and password.  Due to Covid, a mask is required upon entering the hospital/clinic. If you do not have a mask, one will be given to you upon arrival. For doctor visits, patients may have 1 support person aged 18 or older with them. For treatment visits, patients cannot have anyone with them due to current Covid guidelines and our immunocompromised population.  

## 2021-08-16 NOTE — Progress Notes (Signed)
Chaplain engaged in an initial visit with [name].  Chaplain introduced herself and offered support through making sure [name] was comfortable.  Chaplain took her some snacks and changed the channel for her.    Chaplain offered a compassionate presence and support.     08/16/21 1100  Clinical Encounter Type  Visited With Patient  Visit Type Initial

## 2021-08-17 DIAGNOSIS — Z794 Long term (current) use of insulin: Secondary | ICD-10-CM | POA: Diagnosis not present

## 2021-08-17 DIAGNOSIS — Z7984 Long term (current) use of oral hypoglycemic drugs: Secondary | ICD-10-CM | POA: Diagnosis not present

## 2021-08-17 DIAGNOSIS — F250 Schizoaffective disorder, bipolar type: Secondary | ICD-10-CM | POA: Diagnosis not present

## 2021-08-17 DIAGNOSIS — J42 Unspecified chronic bronchitis: Secondary | ICD-10-CM | POA: Diagnosis not present

## 2021-08-17 DIAGNOSIS — J449 Chronic obstructive pulmonary disease, unspecified: Secondary | ICD-10-CM | POA: Diagnosis not present

## 2021-08-17 DIAGNOSIS — R2681 Unsteadiness on feet: Secondary | ICD-10-CM | POA: Diagnosis not present

## 2021-08-17 DIAGNOSIS — F17210 Nicotine dependence, cigarettes, uncomplicated: Secondary | ICD-10-CM | POA: Diagnosis not present

## 2021-08-17 DIAGNOSIS — M160 Bilateral primary osteoarthritis of hip: Secondary | ICD-10-CM | POA: Diagnosis not present

## 2021-08-17 DIAGNOSIS — E1142 Type 2 diabetes mellitus with diabetic polyneuropathy: Secondary | ICD-10-CM | POA: Diagnosis not present

## 2021-08-18 DIAGNOSIS — M5412 Radiculopathy, cervical region: Secondary | ICD-10-CM | POA: Diagnosis not present

## 2021-08-18 DIAGNOSIS — G894 Chronic pain syndrome: Secondary | ICD-10-CM | POA: Diagnosis not present

## 2021-08-18 DIAGNOSIS — M5030 Other cervical disc degeneration, unspecified cervical region: Secondary | ICD-10-CM | POA: Diagnosis not present

## 2021-08-19 DIAGNOSIS — R6889 Other general symptoms and signs: Secondary | ICD-10-CM | POA: Diagnosis not present

## 2021-08-22 ENCOUNTER — Ambulatory Visit (INDEPENDENT_AMBULATORY_CARE_PROVIDER_SITE_OTHER): Payer: Medicare HMO | Admitting: Psychiatry

## 2021-08-22 ENCOUNTER — Telehealth: Payer: Self-pay | Admitting: Family Medicine

## 2021-08-22 DIAGNOSIS — F331 Major depressive disorder, recurrent, moderate: Secondary | ICD-10-CM

## 2021-08-22 NOTE — Progress Notes (Signed)
Virtual Visit via Telephone Note  I connected with [name] on 08/22/21 at 4:07 PM EDT by telephone and verified that I am speaking with the correct person using two identifiers.  Location: Patient: Home Provider: [place] office    I discussed the limitations, risks, security and privacy concerns of performing an evaluation and management service by telephone and the availability of in person appointments. I also discussed with the patient that there may be a patient responsible charge related to this service. The patient expressed understanding and agreed to proceed.   I provided 49 minutes of non-face-to-face time during this encounter.   [name], LCSW            THERAPIST PROGRESS NOTE    Session Time: Monday 08/22/2021 4:07 PM - 4:56 PM   Participation Level: Active  Behavioral Response: depressed, talkative, alert   Type of Therapy: Individual Therapy  Treatment Goals addressed: Patient will score less than 10 on the patient health questionnaire  Progress on goals: Progressing  Interventions: CBT and Supportive             Summary: [name] is a 71 y.o. female who presents with  long standing history of recurrent periods  of depression beginning when she was 60 and her favorite uncle died. Patient reports multiple psychiatric hospitlaizations due to depression and suicidal ideations with the last one occuring in 1997. Patient has participated in outpatient psychotherapy and medication management intermittently since age 46.  She currently is seeing psychiatrist Dr. Harrington Challenger . Prior to this, she was seen at [facility]. Patient also has had ECT at [facility]. Symptoms have worsened in recent months due to family stress and have  included depressed mood, anxiety, excessive worry, and tearfulness.            Patient's last contact was by virtual visit via telephone about 4 weeks ago.  She reports continued symptoms of depression but managing well. She  continues to use listening music, thinking about funny memories, and watching TV.She continues to attend church and recently has begun working on Psychologist, sport and exercise for [employer]. She reports enjoying  attending grandson's graduation but experiencing disappointment and sadness that she hasn't had any contact from him since that time. She continues to have contact with friends via phone.     Suicidal/Homicidal: Nowithout intent/plan      Therapist Response: Reviewed symptoms, discussed stressors, facilitated expression of thoughts and feelings, validated feelings, praised and reinforced patient's efforts to use helpful strategies to try to manage depressive symptoms, praised and reinforced patient's efforts to become more involved in another activity at church, assisted patient examine her thoughts pattens about  about relationship with grandson, assisted patient challenge negative thought patterns and replace with more helpful thought patterns. Diagnosis: Axis I: MDD, Recurrent, moderate    Axis II: Deferred Collaboration of Care: Psychiatrist AEB patient working with psychiatrist Dr. Harrington Challenger  Patient/Guardian was advised Release of Information must be obtained prior to any record release in order to collaborate their care with an outside provider. Patient/Guardian was advised if they have not already done so to contact the registration department to sign all necessary forms in order for [name] to release information regarding their care.   Consent: Patient/Guardian gives verbal consent for treatment and assignment of benefits for services provided during this visit. Patient/Guardian expressed understanding and agreed to proceed.    AmeLie Hollars E Madilynne Mullan, LCSW

## 2021-08-22 NOTE — Telephone Encounter (Signed)
Pt called wanting to know if you could with her in regards to the equipment she is needing the dr to sign off on???

## 2021-08-23 ENCOUNTER — Telehealth: Payer: Medicare HMO

## 2021-08-23 ENCOUNTER — Ambulatory Visit (INDEPENDENT_AMBULATORY_CARE_PROVIDER_SITE_OTHER): Payer: Medicare HMO | Admitting: *Deleted

## 2021-08-23 DIAGNOSIS — F250 Schizoaffective disorder, bipolar type: Secondary | ICD-10-CM | POA: Diagnosis not present

## 2021-08-23 DIAGNOSIS — J449 Chronic obstructive pulmonary disease, unspecified: Secondary | ICD-10-CM | POA: Diagnosis not present

## 2021-08-23 DIAGNOSIS — E1143 Type 2 diabetes mellitus with diabetic autonomic (poly)neuropathy: Secondary | ICD-10-CM

## 2021-08-23 DIAGNOSIS — I129 Hypertensive chronic kidney disease with stage 1 through stage 4 chronic kidney disease, or unspecified chronic kidney disease: Secondary | ICD-10-CM | POA: Diagnosis not present

## 2021-08-23 DIAGNOSIS — Z794 Long term (current) use of insulin: Secondary | ICD-10-CM | POA: Diagnosis not present

## 2021-08-23 DIAGNOSIS — I10 Essential (primary) hypertension: Secondary | ICD-10-CM

## 2021-08-23 DIAGNOSIS — J42 Unspecified chronic bronchitis: Secondary | ICD-10-CM | POA: Diagnosis not present

## 2021-08-23 DIAGNOSIS — I5032 Chronic diastolic (congestive) heart failure: Secondary | ICD-10-CM | POA: Diagnosis not present

## 2021-08-23 DIAGNOSIS — D638 Anemia in other chronic diseases classified elsewhere: Secondary | ICD-10-CM | POA: Diagnosis not present

## 2021-08-23 DIAGNOSIS — R2681 Unsteadiness on feet: Secondary | ICD-10-CM | POA: Diagnosis not present

## 2021-08-23 DIAGNOSIS — Z7984 Long term (current) use of oral hypoglycemic drugs: Secondary | ICD-10-CM | POA: Diagnosis not present

## 2021-08-23 DIAGNOSIS — R768 Other specified abnormal immunological findings in serum: Secondary | ICD-10-CM | POA: Diagnosis not present

## 2021-08-23 DIAGNOSIS — E1142 Type 2 diabetes mellitus with diabetic polyneuropathy: Secondary | ICD-10-CM | POA: Diagnosis not present

## 2021-08-23 DIAGNOSIS — Z6834 Body mass index (BMI) 34.0-34.9, adult: Secondary | ICD-10-CM | POA: Diagnosis not present

## 2021-08-23 DIAGNOSIS — E1122 Type 2 diabetes mellitus with diabetic chronic kidney disease: Secondary | ICD-10-CM | POA: Diagnosis not present

## 2021-08-23 DIAGNOSIS — N189 Chronic kidney disease, unspecified: Secondary | ICD-10-CM | POA: Diagnosis not present

## 2021-08-23 DIAGNOSIS — F17210 Nicotine dependence, cigarettes, uncomplicated: Secondary | ICD-10-CM | POA: Diagnosis not present

## 2021-08-23 DIAGNOSIS — M160 Bilateral primary osteoarthritis of hip: Secondary | ICD-10-CM | POA: Diagnosis not present

## 2021-08-23 DIAGNOSIS — E8721 Acute metabolic acidosis: Secondary | ICD-10-CM | POA: Diagnosis not present

## 2021-08-23 NOTE — Chronic Care Management (AMB) (Signed)
Chronic Care Management   CCM RN Visit Note  08/23/2021 Name: [name] MRN: [id number] DOB: August 23, 1950  Subjective: [name] is a 71 y.o. year old female who is a primary care patient of [name], MD. The care management team was consulted for assistance with disease management and care coordination needs.    Engaged with patient by telephone for initial visit in response to provider referral for case management and/or care coordination services.   Consent to Services:  The patient was given the following information about Chronic Care Management services today, agreed to services, and gave verbal consent: 1. CCM service includes personalized support from designated clinical staff supervised by the primary care provider, including individualized plan of care and coordination with other care providers 2. 24/7 contact phone numbers for assistance for urgent and routine care needs. 3. Service will only be billed when office clinical staff spend 20 minutes or more in a month to coordinate care. 4. Only one practitioner may furnish and bill the service in a calendar month. 5.The patient may stop CCM services at any time (effective at the end of the month) by phone call to the office staff. 6. The patient will be responsible for cost sharing (co-pay) of up to 20% of the service fee (after annual deductible is met). Patient agreed to services and consent obtained.  Patient agreed to services and verbal consent obtained.   Assessment: Review of patient past medical history, allergies, medications, health status, including review of consultants reports, laboratory and other test data, was performed as part of comprehensive evaluation and provision of chronic care management services.   SDOH (Social Determinants of Health) assessments and interventions performed:  SDOH Interventions    Flowsheet Row Most Recent Value  SDOH Interventions   Food Insecurity Interventions Intervention Not  Indicated  Housing Interventions Intervention Not Indicated  Transportation Interventions Intervention Not Indicated        CCM Care Plan  Allergies  Allergen Reactions   Iron Nausea And Vomiting    And itching And itching   Lac Bovis Rash    Doesn't agree with stomach.  Doesn't agree with stomach.  Doesn't agree with stomach.    Penicillins Hives    Has patient had a PCN reaction causing immediate rash, facial/tongue/throat swelling, SOB or lightheadedness with hypotension: Yes Has patient had a PCN reaction causing severe rash involving mucus membranes or skin necrosis: No Has patient had a PCN reaction that required hospitalization No Has patient had a PCN reaction occurring within the last 10 years: No If all of the above answers are "NO", then may proceed with Cephalosporin use.  Other reaction(s): Other (see comments) Has patient had a PCN reaction causing immediate rash, facial/tongue/throat swelling, SOB or lightheadedness with hypotension: Yes Has patient had a PCN reaction causing severe rash involving mucus membranes or skin necrosis: No Has patient had a PCN reaction that required hospitalization No Has patient had a PCN reaction occurring within the last 10 years: No If all of the above answers are "NO", then may proceed with Cephalosporin use. Has patient had a PCN reaction causing immediate rash, facial/tongue/throat swelling, SOB or lightheadedness with hypotension: Yes Has patient had a PCN reaction causing severe rash involving mucus membranes or skin necrosis: No Has patient had a PCN reaction that required hospitalization No Has patient had a PCN reaction occurring within the last 10 years: No If all of the above answers are "NO", then may proceed with Cephalosporin use.  Has patient had a PCN reaction causing immediate rash, facial/tongue/throat swelling, SOB or lightheadedness with hypotension: Yes Has patient had a PCN reaction causing severe rash involving  mucus membranes or skin necrosis: No Has patient had a PCN reaction that required hospitalization No Has patient had a PCN reaction occurring within the last 10 years: No If all of the above answers are "NO", then may proceed with Cephalosporin use. Has patient had a PCN reaction causing immediate rash, facial/tongue/throat swelling, SOB or lightheadedness with hypotension: Yes Has patient had a PCN reaction causing sever... (TRUNCATED)   Phenazopyridine Hives   Phenazopyridine Hcl Hives   Cephalexin Hives   Flonase [Fluticasone]     "It gave me ulcers in my nose"   Milk-Related Compounds Other (See Comments)    Doesn't agree with stomach.    Phenazopyridine Hcl Hives          Outpatient Encounter Medications as of 08/23/2021  Medication Sig Note   ACCU-CHEK GUIDE test strip TEST BLOOD SUGAR FOUR TIMES DAILY  AND AS NEEDED    Accu-Chek Softclix Lancets lancets TEST BLOOD SUGAR THREE TIMES DAILY AS DIRECTED    albuterol (VENTOLIN HFA) 108 (90 Base) MCG/ACT inhaler INHALE 1 TO 2 PUFFS EVERY 6 HOURS AS NEEDED FOR WHEEZING, SHORTNESS OF BREATH    Alcohol Swabs (DROPSAFE ALCOHOL PREP) 70 % PADS USE TO CLEAN FINGER PRIOR TO TESTING FOR BLOOD SUGAR  AS DIRECTED    alendronate (FOSAMAX) 70 MG tablet TAKE 1 TABLET (70 MG TOTAL) BY MOUTH EVERY 7 (SEVEN) DAYS. TAKE WITH A FULL GLASS OF WATER ON AN EMPTY STOMACH.    amLODipine (NORVASC) 10 MG tablet TAKE 1 TABLET EVERY DAY    ascorbic acid (VITAMIN C) 500 MG tablet Take 500 mg by mouth daily.    aspirin EC 81 MG tablet Take 1 tablet (81 mg total) by mouth daily with breakfast.    betamethasone dipropionate 0.05 % cream     Blood Glucose Calibration (ACCU-CHEK GUIDE CONTROL) LIQD USE AS DIRECTED    blood glucose meter kit and supplies Dispense based on patient and insurance preference. Use up to four times daily as directed. (FOR ICD-10 E10.9, E11.9).    buPROPion (WELLBUTRIN XL) 150 MG 24 hr tablet Take 1 tablet (150 mg total) by mouth every  morning.    Calcium Carb-Cholecalciferol (CALTRATE 600+D3 SOFT) 600-20 MG-MCG CHEW Chew 1 tablet by mouth daily at 12 noon.    cetirizine (ZYRTEC) 10 MG tablet Take 1 tablet (10 mg total) by mouth daily.    Cholecalciferol 50 MCG (2000 UT) TABS Take 1 tablet by mouth daily.    Clobetasol Prop Emollient Base (CLOBETASOL PROPIONATE E) 0.05 % emollient cream Apply to affected area qd    Continuous Blood Gluc Sensor (FREESTYLE LIBRE 14 DAY SENSOR) MISC 1 each by Does not apply route every 14 (fourteen) days. Change every 2 weeks    diclofenac Sodium (VOLTAREN) 1 % GEL APPLY 2 GRAMS TOPICALLY FOUR TIMES DAILY.    Docusate Sodium (DSS) 100 MG CAPS Take by mouth.    DROPLET PEN NEEDLES 31G X 8 MM MISC USE FOR INJECTING INSULIN 4 TIMES DAILY.    ezetimibe (ZETIA) 10 MG tablet TAKE 1 TABLET EVERY DAY    famotidine (PEPCID) 40 MG tablet Take 40 mg by mouth 2 (two) times daily.    hydroxychloroquine (PLAQUENIL) 200 MG tablet Take by mouth.    hydrOXYzine (ATARAX/VISTARIL) 10 MG tablet Take 1 tablet (10 mg total) by mouth 3 (three)  times daily as needed.    insulin glargine, 2 Unit Dial, (TOUJEO MAX SOLOSTAR) 300 UNIT/ML Solostar Pen Inject 32 Units into the skin at bedtime.    JARDIANCE 10 MG TABS tablet Take 10 mg by mouth every morning.    lamoTRIgine (LAMICTAL) 100 MG tablet Take 1 tablet (100 mg total) by mouth 2 (two) times daily.    levothyroxine (SYNTHROID) 50 MCG tablet TAKE 1 TABLET EVERY DAY FOR 6 DAYS PER WEEK, 1/2 TABLET 1 DAY PER WEEK    LORazepam (ATIVAN) 0.5 MG tablet Take 1 tablet (0.5 mg total) by mouth 2 (two) times daily.    losartan (COZAAR) 50 MG tablet TAKE 1 TABLET EVERY DAY (Patient taking differently: Take 100 mg by mouth.)    meclizine (ANTIVERT) 25 MG tablet Take 1 tablet (25 mg total) by mouth 3 (three) times daily as needed for dizziness.    methocarbamol (ROBAXIN) 500 MG tablet Take one tablet by mouth two times daily , as needed, for neck and back pain and spasm     methylPREDNISolone (MEDROL DOSEPAK) 4 MG TBPK tablet Take as prescribed    metoprolol tartrate (LOPRESSOR) 50 MG tablet TAKE 1 TABLET TWICE DAILY (NEED MD APPOINTMENT) (Patient taking differently: Take 50 mg by mouth 2 (two) times daily.)    metroNIDAZOLE (FLAGYL) 250 MG tablet Take 1 tablet (250 mg total) by mouth 3 (three) times daily.    mirabegron ER (MYRBETRIQ) 25 MG TB24 tablet Take 1 tablet (25 mg total) by mouth daily.    Misc. Devices (MATTRESS PAD) MISC FIRM MATTRESS PAD for hospital bed x 1    montelukast (SINGULAIR) 10 MG tablet TAKE 1 TABLET EVERY DAY    Multiple Vitamins-Minerals (MULTIVITAMIN GUMMIES ADULT PO) Take 1 tablet by mouth daily.    Nicotine (NICOTROL NS) 10 MG/ML SOLN 1-5x/ hr x 8 weeks, then taper over 4-6 weeks to dc    NOVOLOG FLEXPEN 100 UNIT/ML FlexPen INJECT 16 TO 18 UNITS UNDER SKIN UP TO 3 TIMES A DAY BEFORE MEALS  (DISCARD PEN 28 DAYS AFTER OPENING) 06/02/2021: Patient taking 14 units with small meals, 17 units with large meals   nystatin (MYCOSTATIN/NYSTOP) powder APPLY TO AFFECTED AREA 4 TIMES DAILY.    Omega-3 Fatty Acids (FISH OIL PO) Take 1 capsule by mouth daily.    ondansetron (ZOFRAN) 4 MG tablet Take 1 tablet (4 mg total) by mouth every 8 (eight) hours as needed for nausea or vomiting.    oxyCODONE-acetaminophen (PERCOCET) 10-325 MG tablet Take 1 tablet by mouth every 8 (eight) hours as needed for pain. 03/24/2021: Patient only using twice daily as needed    Plecanatide (TRULANCE) 3 MG TABS Take 3 mg by mouth daily as needed.    pregabalin (LYRICA) 75 MG capsule Take 1 capsule (75 mg total) by mouth daily.    RABEprazole (ACIPHEX) 20 MG tablet Take 1 tablet (20 mg total) by mouth 2 (two) times daily.    RESTASIS 0.05 % ophthalmic emulsion Place 2 drops into both eyes daily.    risperiDONE (RISPERDAL) 0.5 MG tablet Take 1 tablet (0.5 mg total) by mouth at bedtime.    sertraline (ZOLOFT) 100 MG tablet Take 1 tablet (100 mg total) by mouth every morning.     spironolactone (ALDACTONE) 50 MG tablet Take 1 tablet (50 mg total) by mouth daily.    STIOLTO RESPIMAT 2.5-2.5 MCG/ACT AERS INHALE 2 PUFFS INTO THE LUNGS DAILY. (Patient taking differently: Inhale 2 puffs into the lungs daily.)    traZODone (  DESYREL) 150 MG tablet Take 1 tablet (150 mg total) by mouth at bedtime.    UNABLE TO [facility] bed mattress x 1  DX: G47.33, J44.9    UNABLE TO FIND Standard wheelchair Dx M47.16, R29.898    UNABLE TO FIND Med Name: Activice cooling gel spray on arms, legs, and back    UNABLE TO [facility] bed with orthopedic mattress x 1 DX G47.33, G89.4    No facility-administered encounter medications on file as of 08/23/2021.    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[place]) 07/10/2021   Atherosclerosis of aorta ([place]) 07/10/2021   Fatigue 05/12/2021   Advanced care planning/counseling discussion 12/23/2020   Head trauma, initial encounter 12/20/2020   Trigger finger, right 12/12/2020   Confusion 11/24/2020   Blurry vision 11/18/2020   Cervical radiculopathy 07/09/2020   Closed fracture of lateral malleolus of right fibula 02/27/2020   Headache 02/19/2020   Tubular adenoma of colon 02/09/2019   Benign neoplasm of cerebral meninges ([place]) 11/12/2018   Benign meningioma of brain ([place]) 10/31/2018   Osteoarthritis of both hips 07/06/2018   Lipoma of extremity 12/31/2017   Impingement syndrome of left shoulder region 12/27/2017   Leg weakness, bilateral 10/28/2017   Lumbar spondylosis with myelopathy 10/12/2017   Numbness of hand 10/10/2017   Chronic neck pain 07/25/2017   Chronic pain syndrome 07/25/2017   At risk for cardiovascular event 06/10/2017   Diabetic polyneuropathy associated with type 2  diabetes mellitus ([place]) 05/19/2016   Tobacco user 04/24/2016   Obesity (BMI 30.0-34.9) 04/24/2016   History of palpitations 08/09/2015   Labile hypertension 08/03/2015   Normal coronary arteries 08/03/2015   Dizziness 07/15/2015   Left-sided low back pain with left-sided sciatica 06/27/2015   Multinodular goiter 05/06/2015   Rectocele, female 04/27/2015   Anal sphincter incontinence 04/27/2015   Pelvic relaxation due to rectocele 03/30/2015   Pulmonary hypertension ([place]) 02/22/2015   Posterior chest pain 02/21/2015   Episodic cigarette smoking dependence 01/11/2015   Migraine without aura and without status migrainosus, not intractable 07/02/2014   Flatulence 02/18/2014   Microcytic anemia 02/18/2014   COPD mixed type ([place]) 09/16/2013   Hypothyroidism 08/16/2013   Gastroparesis 04/28/2013   Nicotine dependence 03/09/2013   Seizure disorder ([place]) 01/19/2013   Displacement of cervical intervertebral disc without myelopathy 12/13/2012   Bursitis of shoulder 12/13/2012   Cervical disc disorder with radiculopathy of cervical region 10/31/2012   Solitary pulmonary nodule 08/19/2012   Anemia 07/05/2012   Hypersomnia disorder related to a known organic factor 06/11/2012   Pruritus 04/18/2012   Meningioma ([place]) 11/19/2011   Mononeuritis leg 10/25/2011   Carpal tunnel syndrome of right wrist 05/23/2011   Chronic pain of right hand 05/04/2011   Polypharmacy 04/28/2011   Bipolar disorder ([place]) 04/28/2011   Constipation 04/13/2011   Recurrent falls 12/12/2010   Oropharyngeal dysphagia 07/12/2010   Urinary incontinence 12/16/2009   HEARING LOSS 10/26/2009   Well controlled type 2 diabetes mellitus with gastroparesis ([place]) 07/07/2009   Hyperlipidemia 12/11/2008   IBS 12/11/2008   GERD 07/29/2008   MILK PRODUCTS ALLERGY 07/29/2008   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/03/2007   Essential hypertension 06/27/2007   Backache 06/19/2007   Osteoporosis  06/19/2007   Obstructive sleep  apnea 06/19/2007   TRIGGER FINGER 04/18/2007   DIVERTICULOSIS, COLON 11/13/2006    Conditions to be addressed/monitored:HTN, COPD, and DMII  Care Plan : RN Care Manager Plan of Care  Updates made by Kassie Mends, RN since 08/23/2021 12:00 AM     Problem: No plan of care established for management of chronic disease state  (HTN, COPD, DM2, High risk for falls)   Priority: High     Long-Range Goal: Development of plan of care for chronic disease management  (HTN, COPD, DM2, High risk for falls)   Start Date: 08/23/2021  Expected End Date: 02/19/2022  Priority: High  Note:   Current Barriers:  Knowledge Deficits related to plan of care for management of HTN, COPD, and DMII  Chronic Disease Management support and education needs related to HTN, COPD, and DMII Patient reports she lives alone, has CAP aide 8 hours daily x 5 days per week, has church members that assist her frequently, pt does not drive, has necessary DME in the home and plans to talk with primary care provider at 08/31/21 appointment about getting a new walker and has discussed this with PT (pt has home health RN and PT through [facility]), pt reports she has shower chair and aide assists as needed with bathing, cooking, cleaning, etc.  Pt reports she checks blood pressure on occasion with "readings usually good", checks CBG QID with fasting ranges 121-190, random ranges 118-120, follows special diet "sometimes", smokes 2 cigarettes per day and is still working on complete smoking cessation.  Pt reports she does not feel depressed today and sees LCSW Maurice Small for counseling and feels this has helped tremendously.  Patient reports >3 falls in the past year.  RNCM Clinical Goal(s):  Patient will verbalize understanding of plan for management of HTN, COPD, and DMII as evidenced by patient report, review of EHR and  through collaboration with RN Care manager, provider, and care team.    Interventions: 1:1 collaboration with primary care provider regarding development and update of comprehensive plan of care as evidenced by provider attestation and co-signature Inter-disciplinary care team collaboration (see longitudinal plan of care) Evaluation of current treatment plan related to  self management and patient's adherence to plan as established by provider   COPD Interventions:  (Status:  New goal. and Goal on track:  Yes.) Long Term Goal Provided patient with basic written and verbal COPD education on self care/management/and exacerbation prevention Advised patient to self assesses COPD action plan zone and make appointment with provider if in the yellow zone for 48 hours without improvement Advised patient to engage in light exercise as tolerated 3-5 days a week to aid in the the management of COPD Discussed the importance of adequate rest and management of fatigue with COPD Screening for signs and symptoms of depression related to chronic disease state  Assessed social determinant of health barriers Education prescribed via My Chart- COPD action plan Reviewed safety precautions  Diabetes Interventions:  (Status:  New goal. and Goal on track:  Yes.) Long Term Goal Assessed patient's understanding of A1c goal: <7% Provided education to patient about basic DM disease process Reviewed medications with patient and discussed importance of medication adherence Counseled on importance of regular laboratory monitoring as prescribed Discussed plans with patient for ongoing care management follow up and provided patient with direct contact information for care management team Provided patient with written educational materials related to hypo and hyperglycemia and importance of correct treatment Review of patient status, including review  of consultants reports, relevant laboratory and other test results, and medications completed Screening for signs and symptoms of depression  related to chronic disease state  Assessed social determinant of health barriers Reviewed carbohydrate modified diet Education prescribed via My Chart- hypoglycemia Lab Results  Component Value Date   HGBA1C 5.9 (A) 05/20/2021   Hypertension Interventions:  (Status:  New goal. and Goal on track:  Yes.) Long Term Goal Last practice recorded BP readings:  BP Readings from Last 3 Encounters:  08/16/21 137/62  08/10/21 (!) 164/68  07/19/21 116/62  Most recent eGFR/CrCl:  Lab Results  Component Value Date   EGFR 52 (L) 05/10/2021    No components found for: "CRCL"  Evaluation of current treatment plan related to hypertension self management and patient's adherence to plan as established by provider Reviewed medications with patient and discussed importance of compliance Counseled on the importance of exercise goals with target of 150 minutes per week Discussed plans with patient for ongoing care management follow up and provided patient with direct contact information for care management team Advised patient, providing education and rationale, to monitor blood pressure daily and record, calling PCP for findings outside established parameters Discussed complications of poorly controlled blood pressure such as heart disease, stroke, circulatory complications, vision complications, kidney impairment, sexual dysfunction Reviewed importance of following low sodium diet Education prescribed via My Chart- low sodium diet   Patient Goals/Self-Care Activities: Take medications as prescribed   Attend all scheduled provider appointments Call pharmacy for medication refills 3-7 days in advance of running out of medications Attend church or other social activities Perform all self care activities independently  Perform IADL's (shopping, preparing meals, housekeeping, managing finances) independently Call provider office for new concerns or questions  check blood sugar at prescribed times: 4 times  daily check feet daily for cuts, sores or redness enter blood sugar readings and medication or insulin into daily log take the blood sugar log to all doctor visits take the blood sugar meter to all doctor visits trim toenails straight across fill half of plate with vegetables prepare main meal at home 3 to 5 days each week eliminate smoking in my home identify and remove indoor air pollutants develop a rescue plan follow rescue plan if symptoms flare-up eat healthy/prescribed diet: carbohydrate modified, low sodium, heart healthy check blood pressure 3 times per week choose a place to take my blood pressure (home, clinic or office, retail store) write blood pressure results in a log or diary take blood pressure log to all doctor appointments take medications for blood pressure exactly as prescribed eat more whole grains, fruits and vegetables, lean meats and healthy fats Look over education via My Chart- hypoglycemia, low sodium diet, COPD action plan Continue doing exercises prescribed by physical therapy fall prevention strategies: change position slowly, use assistive device such as walker or cane (per provider recommendations) when walking, keep walkways clear, have good lighting in room. It is important to contact your provider if you have any falls, maintain muscle strength/tone by exercise per provider recommendations.       Plan:Telephone follow up appointment with care management team member scheduled for:  10/06/21  Jacqlyn Larsen [facility], BSN RN Case Manager [facility] [phone]

## 2021-08-23 NOTE — Patient Instructions (Signed)
Visit Information   Thank you for taking time to visit with me today. Please don't hesitate to contact me if I can be of assistance to you before our next scheduled telephone appointment.  Following are the goals we discussed today:  Take medications as prescribed   Attend all scheduled provider appointments Call pharmacy for medication refills 3-7 days in advance of running out of medications Attend church or other social activities Perform all self care activities independently  Perform IADL's (shopping, preparing meals, housekeeping, managing finances) independently Call provider office for new concerns or questions  check blood sugar at prescribed times: 4 times daily check feet daily for cuts, sores or redness enter blood sugar readings and medication or insulin into daily log take the blood sugar log to all doctor visits take the blood sugar meter to all doctor visits trim toenails straight across fill half of plate with vegetables prepare main meal at home 3 to 5 days each week eliminate smoking in my home identify and remove indoor air pollutants develop a rescue plan follow rescue plan if symptoms flare-up eat healthy/prescribed diet: carbohydrate modified, low sodium, heart healthy check blood pressure 3 times per week choose a place to take my blood pressure (home, clinic or office, retail store) write blood pressure results in a log or diary take blood pressure log to all doctor appointments take medications for blood pressure exactly as prescribed eat more whole grains, fruits and vegetables, lean meats and healthy fats Look over education via My Chart- hypoglycemia, low sodium diet, COPD action plan Continue doing exercises prescribed by physical therapy fall prevention strategies: change position slowly, use assistive device such as walker or cane (per provider recommendations) when walking, keep walkways clear, have good lighting in room. It is important to contact  your provider if you have any falls, maintain muscle strength/tone by exercise per provider recommendations.  Our next appointment is by telephone on 10/06/21 at 3 pm  Please call the care guide team at [phone] if you need to cancel or reschedule your appointment.   If you are experiencing a Mental Health or [place] or need someone to talk to, please call the Suicide and Crisis Lifeline: 988 call the [place] National Suicide Prevention Lifeline: 612-241-0080 or TTY: 337-537-7581 TTY 234-506-2454) to talk to a trained counselor call 1-800-273-TALK (toll free, 24 hour hotline) go to [facility] [address] [place] [phone]) call the [place]: [phone] call 911   Following is a copy of your full care plan:  Care Plan : [place] of Care  Updates made by Kassie Mends, RN since 08/23/2021 12:00 AM     Problem: No plan of care established for management of chronic disease state  (HTN, COPD, DM2, High risk for falls)   Priority: High     Long-Range Goal: Development of plan of care for chronic disease management  (HTN, COPD, DM2, High risk for falls)   Start Date: 08/23/2021  Expected End Date: 02/19/2022  Priority: High  Note:   Current Barriers:  Knowledge Deficits related to plan of care for management of HTN, COPD, and DMII  Chronic Disease Management support and education needs related to HTN, COPD, and DMII Patient reports she lives alone, has CAP aide 8 hours daily x 5 days per week, has church members that assist her frequently, pt does not drive, has necessary DME in the home and plans to talk with primary care provider  at 08/31/21 appointment about getting a new walker and has discussed this with PT (pt has home health RN and PT through [facility]), pt reports she has shower chair and aide assists as needed with bathing, cooking, cleaning, etc.  Pt reports she checks blood pressure  on occasion with "readings usually good", checks CBG QID with fasting ranges 121-190, random ranges 118-120, follows special diet "sometimes", smokes 2 cigarettes per day and is still working on complete smoking cessation.  Pt reports she does not feel depressed today and sees LCSW Maurice Small for counseling and feels this has helped tremendously.  Patient reports >3 falls in the past year.  RNCM Clinical Goal(s):  Patient will verbalize understanding of plan for management of HTN, COPD, and DMII as evidenced by patient report, review of EHR and  through collaboration with RN Care manager, provider, and care team.   Interventions: 1:1 collaboration with primary care provider regarding development and update of comprehensive plan of care as evidenced by provider attestation and co-signature Inter-disciplinary care team collaboration (see longitudinal plan of care) Evaluation of current treatment plan related to  self management and patient's adherence to plan as established by provider   COPD Interventions:  (Status:  New goal. and Goal on track:  Yes.) Long Term Goal Provided patient with basic written and verbal COPD education on self care/management/and exacerbation prevention Advised patient to self assesses COPD action plan zone and make appointment with provider if in the yellow zone for 48 hours without improvement Advised patient to engage in light exercise as tolerated 3-5 days a week to aid in the the management of COPD Discussed the importance of adequate rest and management of fatigue with COPD Screening for signs and symptoms of depression related to chronic disease state  Assessed social determinant of health barriers Education prescribed via My Chart- COPD action plan Reviewed safety precautions  Diabetes Interventions:  (Status:  New goal. and Goal on track:  Yes.) Long Term Goal Assessed patient's understanding of A1c goal: <7% Provided education to patient about basic DM  disease process Reviewed medications with patient and discussed importance of medication adherence Counseled on importance of regular laboratory monitoring as prescribed Discussed plans with patient for ongoing care management follow up and provided patient with direct contact information for care management team Provided patient with written educational materials related to hypo and hyperglycemia and importance of correct treatment Review of patient status, including review of consultants reports, relevant laboratory and other test results, and medications completed Screening for signs and symptoms of depression related to chronic disease state  Assessed social determinant of health barriers Reviewed carbohydrate modified diet Education prescribed via My Chart- hypoglycemia Lab Results  Component Value Date   HGBA1C 5.9 (A) 05/20/2021   Hypertension Interventions:  (Status:  New goal. and Goal on track:  Yes.) Long Term Goal Last practice recorded BP readings:  BP Readings from Last 3 Encounters:  08/16/21 137/62  08/10/21 (!) 164/68  07/19/21 116/62  Most recent eGFR/CrCl:  Lab Results  Component Value Date   EGFR 52 (L) 05/10/2021    No components found for: "CRCL"  Evaluation of current treatment plan related to hypertension self management and patient's adherence to plan as established by provider Reviewed medications with patient and discussed importance of compliance Counseled on the importance of exercise goals with target of 150 minutes per week Discussed plans with patient for ongoing care management follow up and provided patient with direct contact information for care management team Advised  patient, providing education and rationale, to monitor blood pressure daily and record, calling PCP for findings outside established parameters Discussed complications of poorly controlled blood pressure such as heart disease, stroke, circulatory complications, vision complications,  kidney impairment, sexual dysfunction Reviewed importance of following low sodium diet Education prescribed via My Chart- low sodium diet   Patient Goals/Self-Care Activities: Take medications as prescribed   Attend all scheduled provider appointments Call pharmacy for medication refills 3-7 days in advance of running out of medications Attend church or other social activities Perform all self care activities independently  Perform IADL's (shopping, preparing meals, housekeeping, managing finances) independently Call provider office for new concerns or questions  check blood sugar at prescribed times: 4 times daily check feet daily for cuts, sores or redness enter blood sugar readings and medication or insulin into daily log take the blood sugar log to all doctor visits take the blood sugar meter to all doctor visits trim toenails straight across fill half of plate with vegetables prepare main meal at home 3 to 5 days each week eliminate smoking in my home identify and remove indoor air pollutants develop a rescue plan follow rescue plan if symptoms flare-up eat healthy/prescribed diet: carbohydrate modified, low sodium, heart healthy check blood pressure 3 times per week choose a place to take my blood pressure (home, clinic or office, retail store) write blood pressure results in a log or diary take blood pressure log to all doctor appointments take medications for blood pressure exactly as prescribed eat more whole grains, fruits and vegetables, lean meats and healthy fats Look over education via My Chart- hypoglycemia, low sodium diet, COPD action plan Continue doing exercises prescribed by physical therapy fall prevention strategies: change position slowly, use assistive device such as walker or cane (per provider recommendations) when walking, keep walkways clear, have good lighting in room. It is important to contact your provider if you have any falls, maintain muscle  strength/tone by exercise per provider recommendations.       Consent to CCM Services: Ms. Davisson was given information about Chronic Care Management services including:  CCM service includes personalized support from designated clinical staff supervised by her physician, including individualized plan of care and coordination with other care providers 24/7 contact phone numbers for assistance for urgent and routine care needs. Service will only be billed when office clinical staff spend 20 minutes or more in a month to coordinate care. Only one practitioner may furnish and bill the service in a calendar month. The patient may stop CCM services at any time (effective at the end of the month) by phone call to the office staff. The patient will be responsible for cost sharing (co-pay) of up to 20% of the service fee (after annual deductible is met).  Patient agreed to services and verbal consent obtained.   Patient verbalizes understanding of instructions and care plan provided today and agrees to view in [place]. Active MyChart status and patient understanding of how to access instructions and care plan via MyChart confirmed with patient.     Telephone follow up appointment with care management team member scheduled for: 10/06/21  COPD Action Plan A COPD action plan is a description of what to do when you have a flare (exacerbation) of chronic obstructive pulmonary disease (COPD). Your action plan is a color-coded plan that lists the symptoms that indicate whether your condition is under control and what actions to take. If you have symptoms in the green zone, it means you are  doing well that day. If you have symptoms in the yellow zone, it means you are having a bad day or an exacerbation. If you have symptoms in the red zone, you need urgent medical care. Follow the plan that you and your health care provider developed. Review your plan with your health care provider at each visit. Red  zone Symptoms in this zone mean that you should get medical help right away. They include: Feeling very short of breath, even when you are resting. Not being able to do any activities because of poor breathing. Not being able to sleep because of poor breathing. Fever or shaking chills. Feeling confused or very sleepy. Chest pain. Coughing up blood. If you have any of these symptoms, call emergency services (911 in the [place] or go to the nearest emergency room. Yellow zone Symptoms in this zone mean that your condition may be getting worse. They include: Feeling more short of breath than usual. Having less energy for daily activities than usual. Phlegm or mucus that is thicker than usual. Needing to use your rescue inhaler or nebulizer more often than usual. More ankle swelling than usual. Coughing more than usual. Feeling like you have a chest cold. Trouble sleeping due to COPD symptoms. Decreased appetite. COPD medicines not helping as much as usual. If you experience any "yellow" symptoms: Keep taking your daily medicines as directed. Use your quick-relief inhaler as told by your health care provider. If you were prescribed steroid medicine to take by mouth (oral medicine), start taking it as told by your health care provider. If you were prescribed an antibiotic medicine, start taking it as told by your health care provider. Do not stop taking the antibiotic even if you start to feel better. Use oxygen as told by your health care provider. Get more rest. Do your pursed-lip breathing exercises. Do not smoke. Avoid any irritants in the air. If your signs and symptoms do not improve after taking these steps, call your health care provider right away. Green zone Symptoms in this zone mean that you are doing well. They include: Being able to do your usual activities and exercise. Having the usual amount of coughing, including the same amount of phlegm or mucus. Being able to sleep  well. Having a good appetite. Where to find more information: You can find more information about COPD from: American Lung Association, My COPD Action Plan: www.lung.org COPD Foundation: www.copdfoundation.[place]: https://wilson-eaton.com/ Follow these instructions at home: Continue taking your daily medicines as told by your health care provider. Make sure you receive all the immunizations that your health care provider recommends, especially the pneumococcal and influenza vaccines. Wash your hands often with soap and water. Have family members wash their hands too. Regular hand washing can help prevent infections. Follow your usual exercise and diet plan. Avoid irritants in the air, such as smoke. Do not use any products that contain nicotine or tobacco. These products include cigarettes, chewing tobacco, and vaping devices, such as e-cigarettes. If you need help quitting, ask your health care provider. Summary A COPD action plan tells you what to do when you have a flare (exacerbation) of chronic obstructive pulmonary disease (COPD). Follow each action plan for your symptoms. If you have any symptoms in the red zone, call emergency services (911 in the [place] or go to the nearest emergency room. This information is not intended to replace advice given to you by your health care provider. Make sure you discuss  any questions you have with your health care provider. Document Revised: 01/06/2020 Document Reviewed: 01/06/2020 Elsevier Patient Education  [place]. Low-Sodium Eating Plan Sodium, which is an element that makes up salt, helps you maintain a healthy balance of fluids in your body. Too much sodium can increase your blood pressure and cause fluid and waste to be held in your body. Your health care provider or dietitian may recommend following this plan if you have high blood pressure (hypertension), kidney disease, liver disease, or heart failure.  Eating less sodium can help lower your blood pressure, reduce swelling, and protect your heart, liver, and kidneys. What are tips for following this plan? Reading food labels The Nutrition Facts label lists the amount of sodium in one serving of the food. If you eat more than one serving, you must multiply the listed amount of sodium by the number of servings. Choose foods with less than 140 mg of sodium per serving. Avoid foods with 300 mg of sodium or more per serving. Shopping  Look for lower-sodium products, often labeled as "low-sodium" or "no salt added." Always check the sodium content, even if foods are labeled as "unsalted" or "no salt added." Buy fresh foods. Avoid canned foods and pre-made or frozen meals. Avoid canned, cured, or processed meats. Buy breads that have less than 80 mg of sodium per slice. Cooking  Eat more home-cooked food and less restaurant, buffet, and fast food. Avoid adding salt when cooking. Use salt-free seasonings or herbs instead of table salt or sea salt. Check with your health care provider or pharmacist before using salt substitutes. Cook with plant-based oils, such as canola, sunflower, or olive oil. Meal planning When eating at a restaurant, ask that your food be prepared with less salt or no salt, if possible. Avoid dishes labeled as brined, pickled, cured, smoked, or made with soy sauce, miso, or teriyaki sauce. Avoid foods that contain MSG (monosodium glutamate). MSG is sometimes added to [name] food, bouillon, and some canned foods. Make meals that can be grilled, baked, poached, roasted, or steamed. These are generally made with less sodium. General information Most people on this plan should limit their sodium intake to 1,500-2,000 mg (milligrams) of sodium each day. What foods should I eat? Fruits Fresh, frozen, or canned fruit. Fruit juice. Vegetables Fresh or frozen vegetables. "No salt added" canned vegetables. "No salt added" tomato  sauce and paste. Low-sodium or reduced-sodium tomato and vegetable juice. Grains Low-sodium cereals, including oats, puffed wheat and rice, and shredded wheat. Low-sodium crackers. Unsalted rice. Unsalted pasta. Low-sodium bread. Whole-grain breads and whole-grain pasta. Meats and other proteins Fresh or frozen (no salt added) meat, poultry, seafood, and fish. Low-sodium canned tuna and salmon. Unsalted nuts. Dried peas, beans, and lentils without added salt. Unsalted canned beans. Eggs. Unsalted nut butters. Dairy Milk. Soy milk. Cheese that is naturally low in sodium, such as ricotta cheese, fresh mozzarella, or Swiss cheese. Low-sodium or reduced-sodium cheese. Cream cheese. Yogurt. Seasonings and condiments Fresh and dried herbs and spices. Salt-free seasonings. Low-sodium mustard and ketchup. Sodium-free salad dressing. Sodium-free light mayonnaise. Fresh or refrigerated horseradish. Lemon juice. Vinegar. Other foods Homemade, reduced-sodium, or low-sodium soups. Unsalted popcorn and pretzels. Low-salt or salt-free chips. The items listed above may not be a complete list of foods and beverages you can eat. Contact a dietitian for more information. What foods should I avoid? Vegetables Sauerkraut, pickled vegetables, and relishes. Olives. [place] fries. Onion rings. Regular canned vegetables (not low-sodium or reduced-sodium). Regular canned  tomato sauce and paste (not low-sodium or reduced-sodium). Regular tomato and vegetable juice (not low-sodium or reduced-sodium). Frozen vegetables in sauces. Grains Instant hot cereals. Bread stuffing, pancake, and biscuit mixes. Croutons. Seasoned rice or pasta mixes. Noodle soup cups. Boxed or frozen macaroni and cheese. Regular salted crackers. Self-rising flour. Meats and other proteins Meat or fish that is salted, canned, smoked, spiced, or pickled. Precooked or cured meat, such as sausages or meat loaves. Berniece Salines. Ham. Pepperoni. Hot dogs. Corned beef.  Chipped beef. Salt pork. Jerky. Pickled herring. Anchovies and sardines. Regular canned tuna. Salted nuts. Dairy Processed cheese and cheese spreads. Hard cheeses. Cheese curds. Blue cheese. Feta cheese. String cheese. Regular cottage cheese. Buttermilk. Canned milk. Fats and oils Salted butter. Regular margarine. Ghee. Bacon fat. Seasonings and condiments Onion salt, garlic salt, seasoned salt, table salt, and sea salt. Canned and packaged gravies. Worcestershire sauce. Tartar sauce. Barbecue sauce. Teriyaki sauce. Soy sauce, including reduced-sodium. Steak sauce. Fish sauce. Oyster sauce. Cocktail sauce. Horseradish that you find on the shelf. Regular ketchup and mustard. Meat flavorings and tenderizers. Bouillon cubes. Hot sauce. Pre-made or packaged marinades. Pre-made or packaged taco seasonings. Relishes. Regular salad dressings. Salsa. Other foods Salted popcorn and pretzels. Corn chips and puffs. Potato and tortilla chips. Canned or dried soups. Pizza. Frozen entrees and pot pies. The items listed above may not be a complete list of foods and beverages you should avoid. Contact a dietitian for more information. Summary Eating less sodium can help lower your blood pressure, reduce swelling, and protect your heart, liver, and kidneys. Most people on this plan should limit their sodium intake to 1,500-2,000 mg (milligrams) of sodium each day. Canned, boxed, and frozen foods are high in sodium. Restaurant foods, fast foods, and pizza are also very high in sodium. You also get sodium by adding salt to food. Try to cook at home, eat more fresh fruits and vegetables, and eat less fast food and canned, processed, or prepared foods. This information is not intended to replace advice given to you by your health care provider. Make sure you discuss any questions you have with your health care provider. Document Revised: 04/04/2019 Document Reviewed: 01/29/2019 Elsevier Patient Education  [place]. Hypoglycemia Hypoglycemia occurs when the level of sugar (glucose) in the blood is too low. Hypoglycemia can happen in people who have or do not have diabetes. It can develop quickly, and it can be a medical emergency. For most people, a blood glucose level below 70 mg/dL (3.9 mmol/L) is considered hypoglycemia. Glucose is a type of sugar that provides the body's main source of energy. Certain hormones (insulin and glucagon) control the level of glucose in the blood. Insulin lowers blood glucose, and glucagon raises blood glucose. Hypoglycemia can result from having too much insulin in the bloodstream, or from not eating enough food that contains glucose. You may also have reactive hypoglycemia, which happens within 4 hours after eating a meal. What are the causes? Hypoglycemia occurs most often in people who have diabetes and may be caused by: Diabetes medicine. Not eating enough, or not eating often enough. Increased physical activity. Drinking alcohol on an empty stomach. If you do not have diabetes, hypoglycemia may be caused by: A tumor in the pancreas. Not eating enough, or not eating for long periods at a time (fasting). A severe infection or illness. Problems after having bariatric surgery. Organ failure, such as kidney or liver failure. Certain medicines. What increases the risk? Hypoglycemia is more likely to  develop in people who: Have diabetes and take medicines to lower blood glucose. Abuse alcohol. Have a severe illness. What are the signs or symptoms? Symptoms vary depending on whether the condition is mild, moderate, or severe. Mild hypoglycemia Hunger. Sweating and feeling clammy. Dizziness or feeling light-headed. Sleepiness or restless sleep. Nausea. Increased heart rate. Headache. Blurry vision. Mood changes, such as irritability or anxiety. Tingling or numbness around the mouth, lips, or tongue. Moderate hypoglycemia Confusion and poor judgment. Behavior  changes. Weakness. Irregular heartbeat. A change in coordination. Severe hypoglycemia Severe hypoglycemia is a medical emergency. It can cause: Fainting. Seizures. Loss of consciousness (coma). Death. How is this diagnosed? Hypoglycemia is diagnosed with a blood test to measure your blood glucose level. This blood test is done while you are having symptoms. Your health care provider may also do a physical exam and review your medical history. How is this treated? This condition can be treated by immediately eating or drinking something that contains sugar with 15 grams of fast-acting carbohydrate, such as: 4 oz (120 mL) of fruit juice. 4 oz (120 mL) of regular soda (not diet soda). Several pieces of hard candy. Check food labels to find out how many pieces to eat for 15 grams. 1 Tbsp (15 mL) of sugar or honey. 4 glucose tablets. 1 tube of glucose gel. Treating hypoglycemia if you have diabetes If you are alert and able to swallow safely, follow the 15:15 rule: Take 15 grams of a fast-acting carbohydrate. Talk with your health care provider about how much you should take. Options for getting 15 grams of fast-acting carbohydrate include: Glucose tablets (take 4 tablets). Several pieces of hard candy. Check food labels to find out how many pieces to eat for 15 grams. 4 oz (120 mL) of fruit juice. 4 oz (120 mL) of regular soda (not diet soda). 1 Tbsp (15 mL) of sugar or honey. 1 tube of glucose gel. Check your blood glucose 15 minutes after you take the carbohydrate. If the repeat blood glucose level is still at or below 70 mg/dL (3.9 mmol/L), take 15 grams of a carbohydrate again. If your blood glucose level does not increase above 70 mg/dL (3.9 mmol/L) after 3 tries, seek emergency medical care. After your blood glucose level returns to normal, eat a meal or a snack within 1 hour.  Treating severe hypoglycemia Severe hypoglycemia is when your blood glucose level is below 54 mg/dL (3  mmol/L). Severe hypoglycemia is a medical emergency. Get medical help right away. If you have severe hypoglycemia and you cannot eat or drink, you will need to be given glucagon. A family member or close friend should learn how to check your blood glucose and how to give you glucagon. Ask your health care provider if you need to have an emergency glucagon kit available. Severe hypoglycemia may need to be treated in a hospital. The treatment may include getting glucose through an IV. You may also need treatment for the cause of your hypoglycemia. Follow these instructions at home:  General instructions Take over-the-counter and prescription medicines only as told by your health care provider. Monitor your blood glucose as told by your health care provider. If you drink alcohol: Limit how much you have to: 0-1 drink a day for women who are not pregnant. 0-2 drinks a day for men. Know how much alcohol is in your drink. In the [place] one drink equals one 12 oz bottle of beer (355 mL), one 5 oz glass of wine (148  mL), or one 1 oz glass of hard liquor (44 mL). Be sure to eat food along with drinking alcohol. Be aware that alcohol is absorbed quickly and may have lingering effects that may result in hypoglycemia later. Be sure to do ongoing glucose monitoring. Keep all follow-up visits. This is important. If you have diabetes: Always have a fast-acting carbohydrate (15 grams) option with you to treat low blood glucose. Follow your diabetes management plan as directed by your health care provider. Make sure you: Know the symptoms of hypoglycemia. It is important to treat it right away to prevent it from becoming severe. Check your blood glucose as often as told. Always check before and after exercise. Always check your blood glucose before you drive a motorized vehicle. Take your medicines as told. Follow your meal plan. Eat on time, and do not skip meals. Share your diabetes management plan with  people in your workplace, school, and household. Carry a medical alert card or wear medical alert jewelry. Where to find more information American Diabetes Association: www.diabetes.org Contact a health care provider if: You have problems keeping your blood glucose in your target range. You have frequent episodes of hypoglycemia. Get help right away if: You continue to have hypoglycemia symptoms after eating or drinking something that contains 15 grams of fast-acting carbohydrate, and you cannot get your blood glucose above 70 mg/dL (3.9 mmol/L) while following the 15:15 rule. Your blood glucose is below 54 mg/dL (3 mmol/L). You have a seizure. You faint. These symptoms may represent a serious problem that is an emergency. Do not wait to see if the symptoms will go away. Get medical help right away. Call your local emergency services (911 in the [place] Do not drive yourself to the hospital. Summary Hypoglycemia occurs when the level of sugar (glucose) in the blood is too low. Hypoglycemia can happen in people who have or do not have diabetes. It can develop quickly, and it can be a medical emergency. Make sure you know the symptoms of hypoglycemia and how to treat it. Always have a fast-acting carbohydrate option with you to treat low blood sugar. This information is not intended to replace advice given to you by your health care provider. Make sure you discuss any questions you have with your health care provider. Document Revised: 01/29/2020 Document Reviewed: 01/29/2020 Elsevier Patient Education  [place].

## 2021-08-24 ENCOUNTER — Telehealth: Payer: Self-pay

## 2021-08-24 DIAGNOSIS — F250 Schizoaffective disorder, bipolar type: Secondary | ICD-10-CM | POA: Diagnosis not present

## 2021-08-24 DIAGNOSIS — J42 Unspecified chronic bronchitis: Secondary | ICD-10-CM | POA: Diagnosis not present

## 2021-08-24 DIAGNOSIS — Z7984 Long term (current) use of oral hypoglycemic drugs: Secondary | ICD-10-CM | POA: Diagnosis not present

## 2021-08-24 DIAGNOSIS — R2681 Unsteadiness on feet: Secondary | ICD-10-CM | POA: Diagnosis not present

## 2021-08-24 DIAGNOSIS — M160 Bilateral primary osteoarthritis of hip: Secondary | ICD-10-CM | POA: Diagnosis not present

## 2021-08-24 DIAGNOSIS — Z794 Long term (current) use of insulin: Secondary | ICD-10-CM | POA: Diagnosis not present

## 2021-08-24 DIAGNOSIS — F17210 Nicotine dependence, cigarettes, uncomplicated: Secondary | ICD-10-CM | POA: Diagnosis not present

## 2021-08-24 DIAGNOSIS — E1142 Type 2 diabetes mellitus with diabetic polyneuropathy: Secondary | ICD-10-CM | POA: Diagnosis not present

## 2021-08-24 DIAGNOSIS — J449 Chronic obstructive pulmonary disease, unspecified: Secondary | ICD-10-CM | POA: Diagnosis not present

## 2021-08-24 NOTE — Telephone Encounter (Signed)
[name] called from [facility]. Insurance no longer will be covering home health services for nursing and physical therapy.  They will be discharging her next week. Request a new referral just for nursing and file under her medicaid and will cover under medicaid. Any questions [name] call back # [phone]

## 2021-08-24 NOTE — Telephone Encounter (Signed)
Called patient and left message for them to return call at the office   

## 2021-08-26 ENCOUNTER — Other Ambulatory Visit: Payer: Self-pay | Admitting: *Deleted

## 2021-08-26 ENCOUNTER — Encounter: Payer: Self-pay | Admitting: Podiatry

## 2021-08-26 ENCOUNTER — Ambulatory Visit (INDEPENDENT_AMBULATORY_CARE_PROVIDER_SITE_OTHER): Payer: Medicare HMO | Admitting: Podiatry

## 2021-08-26 ENCOUNTER — Telehealth: Payer: Self-pay | Admitting: Family Medicine

## 2021-08-26 DIAGNOSIS — Q828 Other specified congenital malformations of skin: Secondary | ICD-10-CM | POA: Diagnosis not present

## 2021-08-26 DIAGNOSIS — M47819 Spondylosis without myelopathy or radiculopathy, site unspecified: Secondary | ICD-10-CM

## 2021-08-26 DIAGNOSIS — B351 Tinea unguium: Secondary | ICD-10-CM

## 2021-08-26 DIAGNOSIS — E1142 Type 2 diabetes mellitus with diabetic polyneuropathy: Secondary | ICD-10-CM

## 2021-08-26 DIAGNOSIS — M79674 Pain in right toe(s): Secondary | ICD-10-CM | POA: Diagnosis not present

## 2021-08-26 DIAGNOSIS — M79675 Pain in left toe(s): Secondary | ICD-10-CM

## 2021-08-26 DIAGNOSIS — M5010 Cervical disc disorder with radiculopathy, unspecified cervical region: Secondary | ICD-10-CM

## 2021-08-26 DIAGNOSIS — M810 Age-related osteoporosis without current pathological fracture: Secondary | ICD-10-CM

## 2021-08-26 NOTE — Telephone Encounter (Signed)
Pt states she missed a call from [facility]?

## 2021-08-26 NOTE — Telephone Encounter (Signed)
New referral sent to [facility] per that office they will file under medicaid once new referral is sent

## 2021-08-27 ENCOUNTER — Other Ambulatory Visit: Payer: Self-pay | Admitting: Student

## 2021-08-29 ENCOUNTER — Inpatient Hospital Stay (HOSPITAL_COMMUNITY): Payer: Medicare HMO

## 2021-08-29 VITALS — BP 138/65 | HR 66 | Temp 97.0°F | Resp 18

## 2021-08-29 DIAGNOSIS — N1831 Chronic kidney disease, stage 3a: Secondary | ICD-10-CM | POA: Diagnosis not present

## 2021-08-29 DIAGNOSIS — D509 Iron deficiency anemia, unspecified: Secondary | ICD-10-CM

## 2021-08-29 DIAGNOSIS — Z79899 Other long term (current) drug therapy: Secondary | ICD-10-CM | POA: Diagnosis not present

## 2021-08-29 DIAGNOSIS — D631 Anemia in chronic kidney disease: Secondary | ICD-10-CM | POA: Diagnosis not present

## 2021-08-29 DIAGNOSIS — I129 Hypertensive chronic kidney disease with stage 1 through stage 4 chronic kidney disease, or unspecified chronic kidney disease: Secondary | ICD-10-CM | POA: Diagnosis not present

## 2021-08-29 MED ORDER — LORATADINE 10 MG PO TABS
10.0000 mg | ORAL_TABLET | Freq: Once | ORAL | Status: AC
  Administered 2021-08-29: 10 mg via ORAL
  Filled 2021-08-29: qty 1

## 2021-08-29 MED ORDER — ACETAMINOPHEN 325 MG PO TABS
650.0000 mg | ORAL_TABLET | Freq: Once | ORAL | Status: AC
  Administered 2021-08-29: 650 mg via ORAL
  Filled 2021-08-29: qty 2

## 2021-08-29 MED ORDER — FAMOTIDINE IN NACL 20-0.9 MG/50ML-% IV SOLN
20.0000 mg | Freq: Once | INTRAVENOUS | Status: AC
  Administered 2021-08-29: 20 mg via INTRAVENOUS
  Filled 2021-08-29: qty 50

## 2021-08-29 MED ORDER — SODIUM CHLORIDE 0.9 % IV SOLN: Freq: Once | INTRAVENOUS | Status: AC

## 2021-08-29 MED ORDER — METHYLPREDNISOLONE SODIUM SUCC 125 MG IJ SOLR
125.0000 mg | Freq: Once | INTRAMUSCULAR | Status: AC
  Administered 2021-08-29: 125 mg via INTRAVENOUS
  Filled 2021-08-29: qty 2

## 2021-08-29 MED ORDER — SODIUM CHLORIDE 0.9 % IV SOLN
300.0000 mg | Freq: Once | INTRAVENOUS | Status: AC
  Administered 2021-08-29: 300 mg via INTRAVENOUS
  Filled 2021-08-29: qty 10

## 2021-08-29 NOTE — Patient Instructions (Signed)
[facility]  Discharge Instructions: Thank you for choosing [facility] to provide your oncology and hematology care.  If you have a lab appointment with the [facility], please come in thru the Main Entrance and check in at the main information desk.  Wear comfortable clothing and clothing appropriate for easy access to any Portacath or PICC line.   We strive to give you quality time with your provider. You may need to reschedule your appointment if you arrive late (15 or more minutes).  Arriving late affects you and other patients whose appointments are after yours.  Also, if you miss three or more appointments without notifying the office, you may be dismissed from the clinic at the provider's discretion.      For prescription refill requests, have your pharmacy contact our office and allow 72 hours for refills to be completed.    Today you received the following chemotherapy and/or immunotherapy agents Venofer.  Iron Sucrose Injection What is this medication? IRON SUCROSE (EYE ern SOO krose) treats low levels of iron (iron deficiency anemia) in people with kidney disease. Iron is a mineral that plays an important role in making red blood cells, which carry oxygen from your lungs to the rest of your body. This medicine may be used for other purposes; ask your health care provider or pharmacist if you have questions. COMMON BRAND NAME(S): Venofer What should I tell my care team before I take this medication? They need to know if you have any of these conditions: Anemia not caused by low iron levels Heart disease High levels of iron in the blood Kidney disease Liver disease An unusual or allergic reaction to iron, other medications, foods, dyes, or preservatives Pregnant or trying to get pregnant Breast-feeding How should I use this medication? This medication is for infusion into a vein. It is given in a hospital or clinic setting. Talk to your care team  about the use of this medication in children. While this medication may be prescribed for children as young as 2 years for selected conditions, precautions do apply. Overdosage: If you think you have taken too much of this medicine contact a poison control center or emergency room at once. NOTE: This medicine is only for you. Do not share this medicine with others. What if I miss a dose? It is important not to miss your dose. Call your care team if you are unable to keep an appointment. What may interact with this medication? Do not take this medication with any of the following: Deferoxamine Dimercaprol Other iron products This medication may also interact with the following: Chloramphenicol Deferasirox This list may not describe all possible interactions. Give your health care provider a list of all the medicines, herbs, non-prescription drugs, or dietary supplements you use. Also tell them if you smoke, drink alcohol, or use illegal drugs. Some items may interact with your medicine. What should I watch for while using this medication? Visit your care team regularly. Tell your care team if your symptoms do not start to get better or if they get worse. You may need blood work done while you are taking this medication. You may need to follow a special diet. Talk to your care team. Foods that contain iron include: whole grains/cereals, dried fruits, beans, or peas, leafy green vegetables, and organ meats (liver, kidney). What side effects may I notice from receiving this medication? Side effects that you should report to your care team as soon as possible:  Allergic reactions--skin rash, itching, hives, swelling of the face, lips, tongue, or throat Low blood pressure--dizziness, feeling faint or lightheaded, blurry vision Shortness of breath Side effects that usually do not require medical attention (report to your care team if they continue or are bothersome): Flushing Headache Joint  pain Muscle pain Nausea Pain, redness, or irritation at injection site This list may not describe all possible side effects. Call your doctor for medical advice about side effects. You may report side effects to FDA at 1-800-FDA-1088. Where should I keep my medication? This medication is given in a hospital or clinic and will not be stored at home. NOTE: This sheet is a summary. It may not cover all possible information. If you have questions about this medicine, talk to your doctor, pharmacist, or health care provider.  2023 Elsevier/Gold Standard (2020-07-23 00:00:00)        To help prevent nausea and vomiting after your treatment, we encourage you to take your nausea medication as directed.  BELOW ARE SYMPTOMS THAT SHOULD BE REPORTED IMMEDIATELY: *FEVER GREATER THAN 100.4 F (38 C) OR HIGHER *CHILLS OR SWEATING *NAUSEA AND VOMITING THAT IS NOT CONTROLLED WITH YOUR NAUSEA MEDICATION *UNUSUAL SHORTNESS OF BREATH *UNUSUAL BRUISING OR BLEEDING *URINARY PROBLEMS (pain or burning when urinating, or frequent urination) *BOWEL PROBLEMS (unusual diarrhea, constipation, pain near the anus) TENDERNESS IN MOUTH AND THROAT WITH OR WITHOUT PRESENCE OF ULCERS (sore throat, sores in mouth, or a toothache) UNUSUAL RASH, SWELLING OR PAIN  UNUSUAL VAGINAL DISCHARGE OR ITCHING   Items with * indicate a potential emergency and should be followed up as soon as possible or go to the Emergency Department if any problems should occur.  Please show the CHEMOTHERAPY ALERT CARD or IMMUNOTHERAPY ALERT CARD at check-in to the Emergency Department and triage nurse.  Should you have questions after your visit or need to cancel or reschedule your appointment, please contact [facility] [phone]  and follow the prompts.  Office hours are 8:00 a.m. to 4:30 p.m. Monday - Friday. Please note that voicemails left after 4:00 p.m. may not be returned until the following business day.  We are closed  weekends and major holidays. You have access to a nurse at all times for urgent questions. Please call the main number to the clinic [phone] and follow the prompts.  For any non-urgent questions, you may also contact your provider using MyChart. We now offer e-Visits for anyone 38 and older to request care online for non-urgent symptoms. For details visit [email or web address].   Also download the MyChart app! Go to the app store, search "MyChart", open the app, select [facility], and log in with your MyChart username and password.  Masks are optional in the cancer centers. If you would like for your care team to wear a mask while they are taking care of you, please let them know. For doctor visits, patients may have with them one support person who is at least 71 years old. At this time, visitors are not allowed in the infusion area.

## 2021-08-29 NOTE — Progress Notes (Signed)
Patient presents today for iron infusion.  Patient is in satisfactory condition with no new complaints voiced.  Vital signs are stable.  We will proceed with infusion per MD orders.   Patient tolerated iron infusion well with no complaints voiced.  At time of discharge, I noticed that IV catheter had been pulled almost completely out and the IV had infiltrated.  Patient's arm was swollen.  Patient denied any pain or discomfort.  Patient left ambulatory in stable condition.  Vital signs stable at discharge.  Follow up as scheduled.

## 2021-08-30 ENCOUNTER — Telehealth: Payer: Self-pay | Admitting: Family Medicine

## 2021-08-30 DIAGNOSIS — J42 Unspecified chronic bronchitis: Secondary | ICD-10-CM | POA: Diagnosis not present

## 2021-08-30 DIAGNOSIS — F17210 Nicotine dependence, cigarettes, uncomplicated: Secondary | ICD-10-CM | POA: Diagnosis not present

## 2021-08-30 DIAGNOSIS — Z7984 Long term (current) use of oral hypoglycemic drugs: Secondary | ICD-10-CM | POA: Diagnosis not present

## 2021-08-30 DIAGNOSIS — J449 Chronic obstructive pulmonary disease, unspecified: Secondary | ICD-10-CM | POA: Diagnosis not present

## 2021-08-30 DIAGNOSIS — F250 Schizoaffective disorder, bipolar type: Secondary | ICD-10-CM | POA: Diagnosis not present

## 2021-08-30 DIAGNOSIS — Z794 Long term (current) use of insulin: Secondary | ICD-10-CM | POA: Diagnosis not present

## 2021-08-30 DIAGNOSIS — R2681 Unsteadiness on feet: Secondary | ICD-10-CM | POA: Diagnosis not present

## 2021-08-30 DIAGNOSIS — E1142 Type 2 diabetes mellitus with diabetic polyneuropathy: Secondary | ICD-10-CM | POA: Diagnosis not present

## 2021-08-30 DIAGNOSIS — M160 Bilateral primary osteoarthritis of hip: Secondary | ICD-10-CM | POA: Diagnosis not present

## 2021-08-30 NOTE — Telephone Encounter (Signed)
Pt called stating that he is wanting her to come off of losartan & jardiance???

## 2021-08-30 NOTE — Telephone Encounter (Signed)
Spoke to patient see other tele msg

## 2021-08-30 NOTE — Telephone Encounter (Signed)
Patient aware.

## 2021-08-31 ENCOUNTER — Encounter: Payer: Self-pay | Admitting: Family Medicine

## 2021-08-31 ENCOUNTER — Ambulatory Visit (INDEPENDENT_AMBULATORY_CARE_PROVIDER_SITE_OTHER): Payer: Medicare HMO | Admitting: Family Medicine

## 2021-08-31 VITALS — BP 130/68 | HR 64 | Ht 59.0 in | Wt 161.0 lb

## 2021-08-31 DIAGNOSIS — M7542 Impingement syndrome of left shoulder: Secondary | ICD-10-CM | POA: Diagnosis not present

## 2021-08-31 DIAGNOSIS — R350 Frequency of micturition: Secondary | ICD-10-CM

## 2021-08-31 DIAGNOSIS — F17210 Nicotine dependence, cigarettes, uncomplicated: Secondary | ICD-10-CM | POA: Diagnosis not present

## 2021-08-31 DIAGNOSIS — J449 Chronic obstructive pulmonary disease, unspecified: Secondary | ICD-10-CM | POA: Diagnosis not present

## 2021-08-31 DIAGNOSIS — Z7984 Long term (current) use of oral hypoglycemic drugs: Secondary | ICD-10-CM | POA: Diagnosis not present

## 2021-08-31 DIAGNOSIS — F250 Schizoaffective disorder, bipolar type: Secondary | ICD-10-CM | POA: Diagnosis not present

## 2021-08-31 DIAGNOSIS — G43009 Migraine without aura, not intractable, without status migrainosus: Secondary | ICD-10-CM | POA: Diagnosis not present

## 2021-08-31 DIAGNOSIS — I10 Essential (primary) hypertension: Secondary | ICD-10-CM | POA: Diagnosis not present

## 2021-08-31 DIAGNOSIS — M4716 Other spondylosis with myelopathy, lumbar region: Secondary | ICD-10-CM | POA: Diagnosis not present

## 2021-08-31 DIAGNOSIS — E782 Mixed hyperlipidemia: Secondary | ICD-10-CM

## 2021-08-31 DIAGNOSIS — M160 Bilateral primary osteoarthritis of hip: Secondary | ICD-10-CM | POA: Diagnosis not present

## 2021-08-31 DIAGNOSIS — J42 Unspecified chronic bronchitis: Secondary | ICD-10-CM | POA: Diagnosis not present

## 2021-08-31 DIAGNOSIS — E1143 Type 2 diabetes mellitus with diabetic autonomic (poly)neuropathy: Secondary | ICD-10-CM

## 2021-08-31 DIAGNOSIS — E1142 Type 2 diabetes mellitus with diabetic polyneuropathy: Secondary | ICD-10-CM | POA: Diagnosis not present

## 2021-08-31 DIAGNOSIS — R2681 Unsteadiness on feet: Secondary | ICD-10-CM | POA: Diagnosis not present

## 2021-08-31 DIAGNOSIS — E039 Hypothyroidism, unspecified: Secondary | ICD-10-CM | POA: Diagnosis not present

## 2021-08-31 DIAGNOSIS — Z794 Long term (current) use of insulin: Secondary | ICD-10-CM | POA: Diagnosis not present

## 2021-08-31 DIAGNOSIS — E559 Vitamin D deficiency, unspecified: Secondary | ICD-10-CM | POA: Diagnosis not present

## 2021-08-31 DIAGNOSIS — N39498 Other specified urinary incontinence: Secondary | ICD-10-CM

## 2021-08-31 LAB — POCT URINALYSIS DIP (CLINITEK)
Bilirubin, UA: NEGATIVE
Blood, UA: NEGATIVE
Glucose, UA: 250 mg/dL — AB
Ketones, POC UA: NEGATIVE mg/dL
Nitrite, UA: NEGATIVE
POC PROTEIN,UA: NEGATIVE
Spec Grav, UA: 1.02 (ref 1.010–1.025)
Urobilinogen, UA: 0.2 E.U./dL
pH, UA: 5.5 (ref 5.0–8.0)

## 2021-08-31 NOTE — Progress Notes (Signed)
[name]     MRN: [id number]      DOB: 1950/12/26   HPI [name] is here for assessment for walker with arm support she has chronic back pain with lower extremity weakness and upper extremity weakness and significant shoulder pain and weakness with reduced ROM bilaterally, has a h/o intermittent  light headedness , has unsteady gait and repeated falls C/o increased social isolation, states she does not enjoy being around  people, feels strange around hem ad constantly stresses about the relationship she has with her son and his children, not as close as she would like ROS Denies recent fever or chills. Denies sinus pressure, nasal congestion, ear pain or sore throat. Denies chest congestion, productive cough or wheezing. Denies chest pains, palpitations and leg swelling Denies headaches, seizures, numbness, or tingling. Chronic depression and anxiety and  insomnia. Denies skin break down or rash.   PE  BP 130/68   Pulse 64   Ht '4\' 11"'$  (1.499 m)   Wt 161 lb (73 kg)   SpO2 94%   BMI 32.52 kg/m   Patient alert and oriented and in no cardiopulmonary distress.  HEENT: No facial asymmetry, EOMI,     Neck decreased ROM .  Chest: Clear to auscultation bilaterally.  CVS: S1, S2 no murmurs, no S3.Regular rate.    Ext: No edema  MS: decreased  ROM spine, shoulders, hips and knees.  Skin: Intact, no ulcerations or rash noted.  Psych: Good eye contact, normal affect. Memory intact not anxious or depressed appearing.  CNS: CN 2-12 intact, power,  normal throughout.no focal deficits noted.   Assessment & Plan  Impingement syndrome of left shoulder region Needs walker with arm support, significant rght shoulder pathology, also has limittion in movement of right shoulder  Lumbar spondylosis with myelopathy unsteady gait , recurrent falls, needs walker for safe ambulation, due to upper extremity weakness and shouldr pathology, needs arm rests on walker  Migraine without  aura and without status migrainosus, not intractable Controlled, no change in medication managed by Neurology  Hypothyroidism Controlled, no change in medication   Hyperlipidemia Hyperlipidemia:Low fat diet discussed and encouraged.   Lipid Panel  Lab Results  Component Value Date   CHOL 155 05/10/2021   HDL 42 05/10/2021   LDLCALC 79 05/10/2021   TRIG 200 (H) 05/10/2021   CHOLHDL 3.7 05/10/2021     Needs to reduce fat intake  Essential hypertension Controlled, no change in medication DASH diet and commitment to daily physical activity for a minimum of 30 minutes discussed and encouraged, as a part of hypertension management. The importance of attaining a healthy weight is also discussed.     08/31/2021    1:51 PM 08/31/2021    1:20 PM 08/29/2021    4:41 PM 08/29/2021    1:05 PM 08/16/2021   12:46 PM 08/16/2021    9:25 AM 08/10/2021   10:14 AM  BP/Weight  Systolic BP 485 462 703 500 938 182 993  Diastolic BP 68 71 65 67 62 56 68  Wt. (Lbs)  161     167.11  BMI  32.52 kg/m2     32.81 kg/m2       Episodic cigarette smoking dependence Currently quit x 2 months, she is applauded on this  Urinary frequency 1 week history, abn CCUA, wil wait on c/s to treat if needed  Well controlled type 2 diabetes mellitus with gastroparesis ([place]) Controlled, no change in medication Mnaged by endo, recntly has  had jardiance d/c by nephrology [name] is reminded of the importance of commitment to daily physical activity for 30 minutes or more, as able and the need to limit carbohydrate intake to 30 to 60 grams per meal to help with blood sugar control.   The need to take medication as prescribed, test blood sugar as directed, and to call between visits if there is a concern that blood sugar is uncontrolled is also discussed.   [name] is reminded of the importance of daily foot exam, annual eye examination, and good blood sugar, blood pressure and cholesterol control.     Latest  Ref Rng & Units 07/07/2021   12:04 PM 05/20/2021    5:27 PM 05/10/2021   11:37 AM 01/17/2021   12:06 PM 12/23/2020   12:44 PM  Diabetic Labs  HbA1c 4.0 - 5.6 %  5.9   5.9    Chol 100 - 199 mg/dL   155     HDL >39 mg/dL   42     Calc LDL 0 - 99 mg/dL   79     Triglycerides 0 - 149 mg/dL   200     Creatinine 0.44 - 1.00 mg/dL 1.26   1.13   1.23       08/31/2021    1:51 PM 08/31/2021    1:20 PM 08/29/2021    4:41 PM 08/29/2021    1:05 PM 08/16/2021   12:46 PM 08/16/2021    9:25 AM 08/10/2021   10:14 AM  BP/Weight  Systolic BP 893 734 287 681 157 262 035  Diastolic BP 68 71 65 67 62 56 68  Wt. (Lbs)  161     167.11  BMI  32.52 kg/m2     32.81 kg/m2      Latest Ref Rng & Units 05/26/2020   12:00 AM 01/01/2019    1:40 PM  Foot/eye exam completion dates  Eye Exam No Retinopathy No Retinopathy       Foot Form Completion   Done     This result is from an external source.

## 2021-08-31 NOTE — Patient Instructions (Addendum)
F/U in end September, call if you need me sooner . Flu vaccine at visit  [place] in office today due to frequency  Fasting lipid, cmp and EGFR and Vit D 5 days before Sept visit  CONGRATS on no smoking since April    Careful not to fall  Walker with arm support needed

## 2021-09-01 ENCOUNTER — Telehealth: Payer: Self-pay | Admitting: Family Medicine

## 2021-09-01 DIAGNOSIS — R2681 Unsteadiness on feet: Secondary | ICD-10-CM | POA: Diagnosis not present

## 2021-09-01 DIAGNOSIS — Z794 Long term (current) use of insulin: Secondary | ICD-10-CM | POA: Diagnosis not present

## 2021-09-01 DIAGNOSIS — E1142 Type 2 diabetes mellitus with diabetic polyneuropathy: Secondary | ICD-10-CM | POA: Diagnosis not present

## 2021-09-01 DIAGNOSIS — F250 Schizoaffective disorder, bipolar type: Secondary | ICD-10-CM | POA: Diagnosis not present

## 2021-09-01 DIAGNOSIS — I5032 Chronic diastolic (congestive) heart failure: Secondary | ICD-10-CM | POA: Diagnosis not present

## 2021-09-01 DIAGNOSIS — R350 Frequency of micturition: Secondary | ICD-10-CM | POA: Insufficient documentation

## 2021-09-01 DIAGNOSIS — E1129 Type 2 diabetes mellitus with other diabetic kidney complication: Secondary | ICD-10-CM | POA: Diagnosis not present

## 2021-09-01 DIAGNOSIS — N189 Chronic kidney disease, unspecified: Secondary | ICD-10-CM | POA: Diagnosis not present

## 2021-09-01 DIAGNOSIS — M160 Bilateral primary osteoarthritis of hip: Secondary | ICD-10-CM | POA: Diagnosis not present

## 2021-09-01 DIAGNOSIS — J42 Unspecified chronic bronchitis: Secondary | ICD-10-CM | POA: Diagnosis not present

## 2021-09-01 DIAGNOSIS — I129 Hypertensive chronic kidney disease with stage 1 through stage 4 chronic kidney disease, or unspecified chronic kidney disease: Secondary | ICD-10-CM | POA: Diagnosis not present

## 2021-09-01 DIAGNOSIS — E1122 Type 2 diabetes mellitus with diabetic chronic kidney disease: Secondary | ICD-10-CM | POA: Diagnosis not present

## 2021-09-01 DIAGNOSIS — Z7984 Long term (current) use of oral hypoglycemic drugs: Secondary | ICD-10-CM | POA: Diagnosis not present

## 2021-09-01 DIAGNOSIS — N170 Acute kidney failure with tubular necrosis: Secondary | ICD-10-CM | POA: Diagnosis not present

## 2021-09-01 DIAGNOSIS — R809 Proteinuria, unspecified: Secondary | ICD-10-CM | POA: Diagnosis not present

## 2021-09-01 DIAGNOSIS — J449 Chronic obstructive pulmonary disease, unspecified: Secondary | ICD-10-CM | POA: Diagnosis not present

## 2021-09-01 DIAGNOSIS — F17210 Nicotine dependence, cigarettes, uncomplicated: Secondary | ICD-10-CM | POA: Diagnosis not present

## 2021-09-01 DIAGNOSIS — D638 Anemia in other chronic diseases classified elsewhere: Secondary | ICD-10-CM | POA: Diagnosis not present

## 2021-09-01 NOTE — Assessment & Plan Note (Signed)
Currently quit x 2 months, she is applauded on this

## 2021-09-01 NOTE — Assessment & Plan Note (Signed)
unsteady gait , recurrent falls, needs walker for safe ambulation, due to upper extremity weakness and shouldr pathology, needs arm rests on walker

## 2021-09-01 NOTE — Assessment & Plan Note (Signed)
1 week history, abn CCUA, wil wait on c/s to treat if needed

## 2021-09-01 NOTE — Progress Notes (Incomplete)
   Yerania Chamorro Brougham     MRN: [id number]      DOB: [birth year]   HPI Ms. Quinteros is here for assessment for walker with arm support she has chronic back pain with lower extremity weakness and upper extremity weakness and significant shoulder pain and weakness with reduced ROM bilaterally, has a h/o intermittent  light headedness , has unsteady gait and repeated falls C/o increased social isolation, states she does not enjoy being around  people, feels strange around hem ad constantly stresses about the relationship she has with her son and his children, not as close as she would like ROS Denies recent fever or chills. Denies sinus pressure, nasal congestion, ear pain or sore throat. Denies chest congestion, productive cough or wheezing. Denies chest pains, palpitations and leg swelling Denies headaches, seizures, numbness, or tingling. Chronic depression and anxiety and  insomnia. Denies skin break down or rash.   PE  BP 130/68   Pulse 64   Ht '4\' 11"'$  (1.499 m)   Wt 161 lb (73 kg)   SpO2 94%   BMI 32.52 kg/m   Patient alert and oriented and in no cardiopulmonary distress.  HEENT: No facial asymmetry, EOMI,     Neck decreased ROM .  Chest: Clear to auscultation bilaterally.  CVS: S1, S2 no murmurs, no S3.Regular rate.    Ext: No edema  MS: decreased  ROM spine, shoulders, hips and knees.  Skin: Intact, no ulcerations or rash noted.  Psych: Good eye contact, normal affect. Memory intact not anxious or depressed appearing.  CNS: CN 2-12 intact, power,  normal throughout.no focal deficits noted.   Assessment & Plan  ***

## 2021-09-01 NOTE — Assessment & Plan Note (Signed)
Needs walker with arm support, significant rght shoulder pathology, also has limittion in movement of right shoulder

## 2021-09-01 NOTE — Assessment & Plan Note (Signed)
Controlled, no change in medication Mnaged by endo, recntly has had jardiance d/c by nephrology Ms. Allensworth is reminded of the importance of commitment to daily physical activity for 30 minutes or more, as able and the need to limit carbohydrate intake to 30 to 60 grams per meal to help with blood sugar control.   The need to take medication as prescribed, test blood sugar as directed, and to call between visits if there is a concern that blood sugar is uncontrolled is also discussed.   Ms. Macdonell is reminded of the importance of daily foot exam, annual eye examination, and good blood sugar, blood pressure and cholesterol control.     Latest Ref Rng & Units 07/07/2021   12:04 PM 05/20/2021    5:27 PM 05/10/2021   11:37 AM 01/17/2021   12:06 PM 12/23/2020   12:44 PM  Diabetic Labs  HbA1c 4.0 - 5.6 %  5.9   5.9    Chol 100 - 199 mg/dL   155     HDL >39 mg/dL   42     Calc LDL 0 - 99 mg/dL   79     Triglycerides 0 - 149 mg/dL   200     Creatinine 0.44 - 1.00 mg/dL 1.26   1.13   1.23       08/31/2021    1:51 PM 08/31/2021    1:20 PM 08/29/2021    4:41 PM 08/29/2021    1:05 PM 08/16/2021   12:46 PM 08/16/2021    9:25 AM 08/10/2021   10:14 AM  BP/Weight  Systolic BP 099 833 825 053 976 734 193  Diastolic BP 68 71 65 67 62 56 68  Wt. (Lbs)  161     167.11  BMI  32.52 kg/m2     32.81 kg/m2      Latest Ref Rng & Units 05/26/2020   12:00 AM 01/01/2019    1:40 PM  Foot/eye exam completion dates  Eye Exam No Retinopathy No Retinopathy       Foot Form Completion   Done     This result is from an external source.

## 2021-09-01 NOTE — Telephone Encounter (Signed)
[name] with [facility] called on patient behalf   Needs new orders for [facility]   Nursing  for med management is what orders need to say to get patient approved for [facility]  Call back # [phone] Fax # [phone]

## 2021-09-01 NOTE — Assessment & Plan Note (Deleted)
1 week history with mild discomfort Abn CCUA will wait on c/s to treat if needed

## 2021-09-01 NOTE — Assessment & Plan Note (Signed)
Controlled, no change in medication  

## 2021-09-01 NOTE — Assessment & Plan Note (Signed)
Hyperlipidemia:Low fat diet discussed and encouraged.   Lipid Panel  Lab Results  Component Value Date   CHOL 155 05/10/2021   HDL 42 05/10/2021   LDLCALC 79 05/10/2021   TRIG 200 (H) 05/10/2021   CHOLHDL 3.7 05/10/2021     Needs to reduce fat intake

## 2021-09-01 NOTE — Assessment & Plan Note (Signed)
Controlled, no change in medication DASH diet and commitment to daily physical activity for a minimum of 30 minutes discussed and encouraged, as a part of hypertension management. The importance of attaining a healthy weight is also discussed.     08/31/2021    1:51 PM 08/31/2021    1:20 PM 08/29/2021    4:41 PM 08/29/2021    1:05 PM 08/16/2021   12:46 PM 08/16/2021    9:25 AM 08/10/2021   10:14 AM  BP/Weight  Systolic BP 130 152 138 123 137 110 164  Diastolic BP 68 71 65 67 62 56 68  Wt. (Lbs)  161     167.11  BMI  32.52 kg/m2     32.81 kg/m2

## 2021-09-01 NOTE — Assessment & Plan Note (Signed)
Controlled, no change in medication managed by Neurology

## 2021-09-02 ENCOUNTER — Telehealth: Payer: Self-pay | Admitting: Family Medicine

## 2021-09-02 ENCOUNTER — Other Ambulatory Visit: Payer: Self-pay | Admitting: Internal Medicine

## 2021-09-03 ENCOUNTER — Other Ambulatory Visit: Payer: Self-pay | Admitting: Dermatology

## 2021-09-03 LAB — URINE CULTURE

## 2021-09-04 IMAGING — US US CAROTID DUPLEX BILAT
1 series · 13 of 24 positions shown · non-contrast
Comparison: None.

CLINICAL DATA: Carotid bruit

EXAM:
BILATERAL CAROTID DUPLEX ULTRASOUND
TECHNIQUE: Gray scale imaging, color Doppler and duplex ultrasound were
performed of bilateral carotid and vertebral arteries in the neck.

[Series 1: us carotid duplex bilat · 0.06mm/px · 13 of 106 slices shown]
[im 1/106]
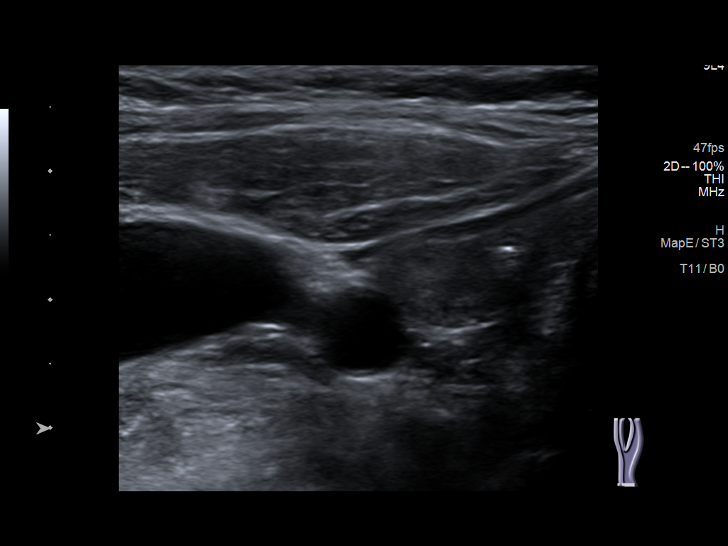
[im 10/106]
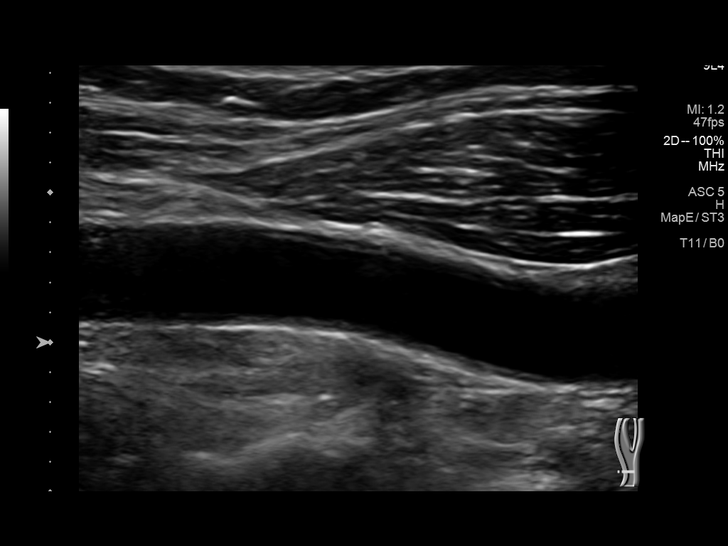
[im 19/106]
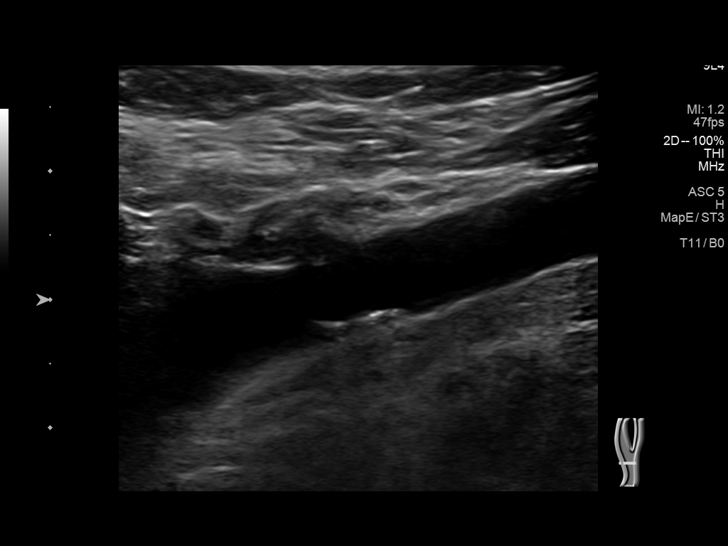
[im 28/106]
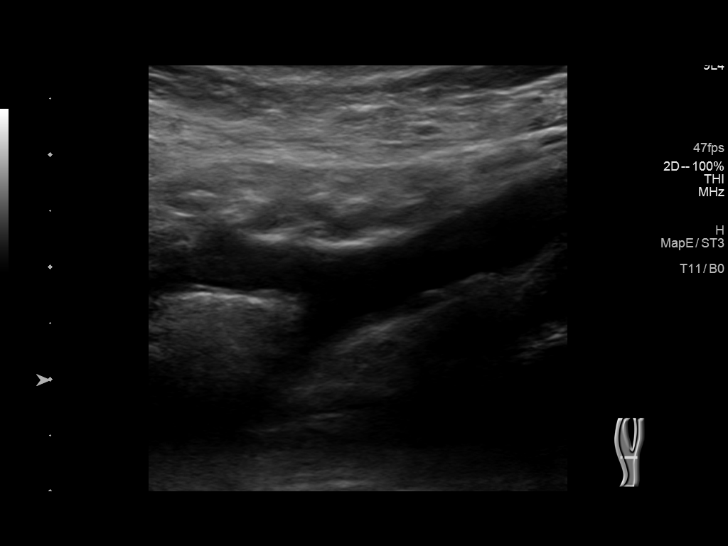
[im 37/106]
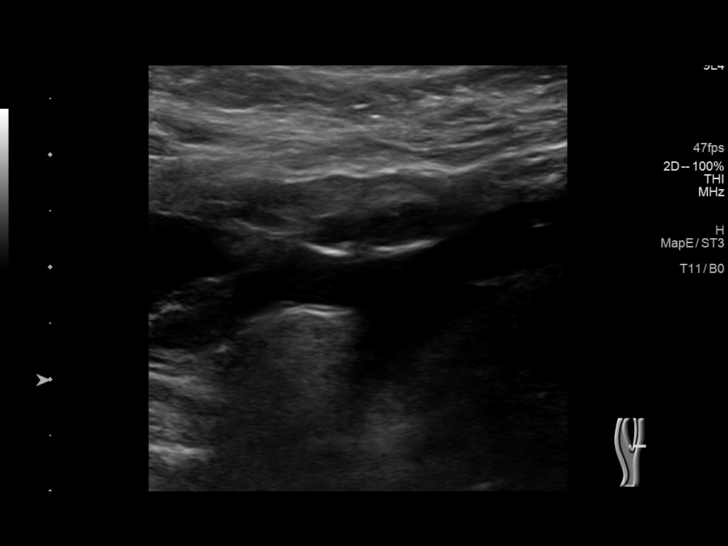
[im 46/106]
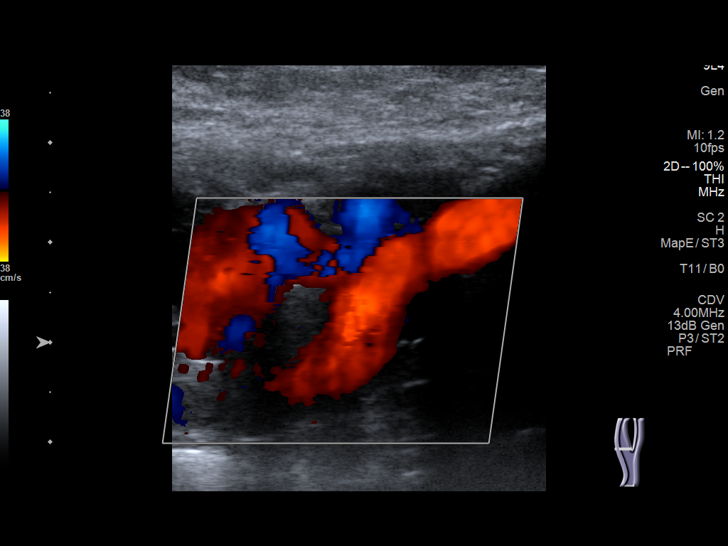
[im 55/106]
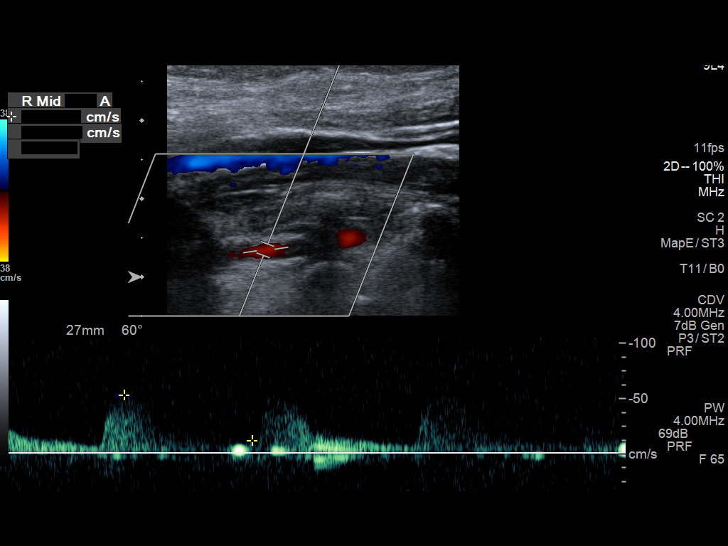
[im 60/106]
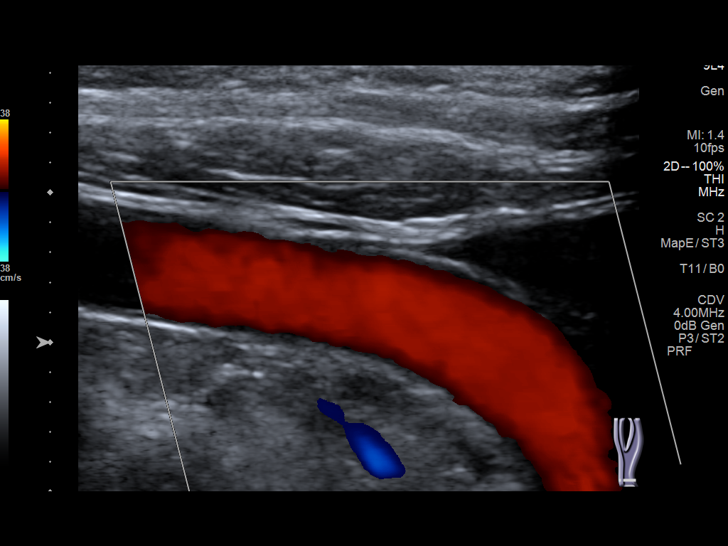
[im 69/106]
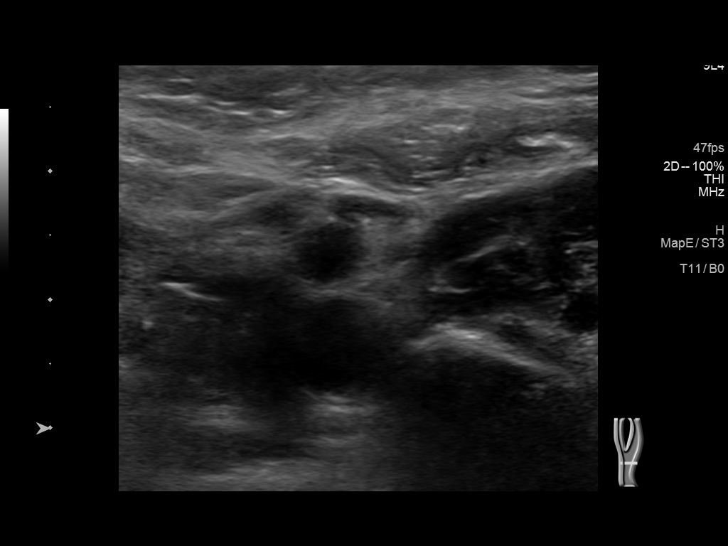
[im 78/106]
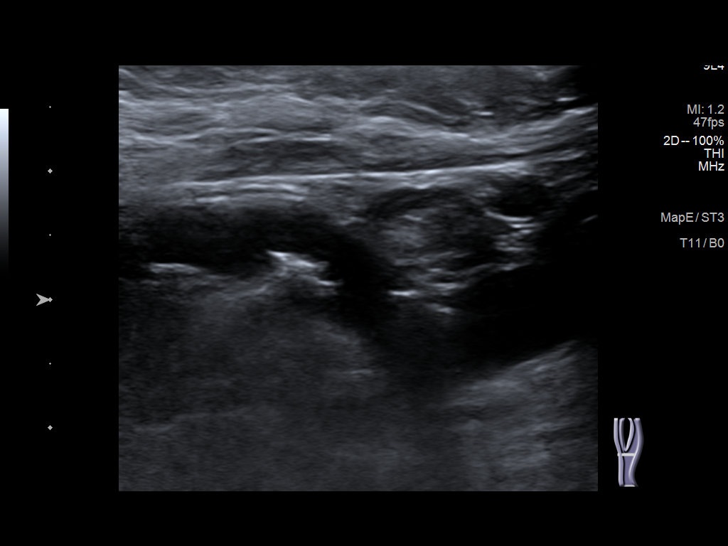
[im 87/106]
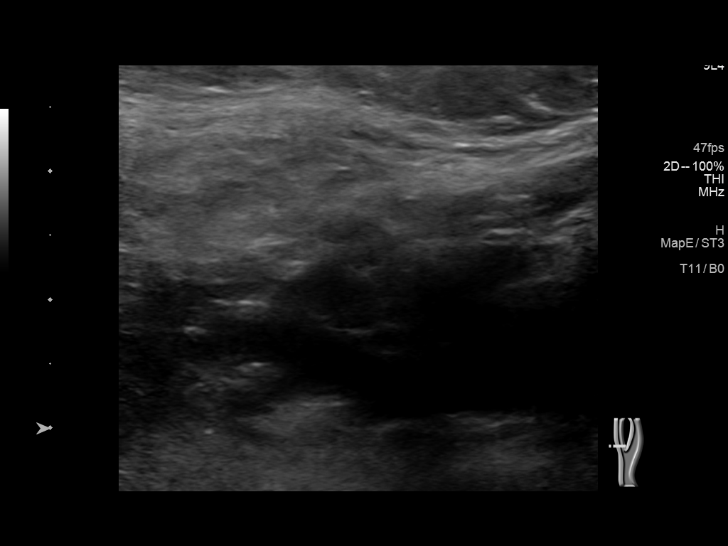
[im 96/106]
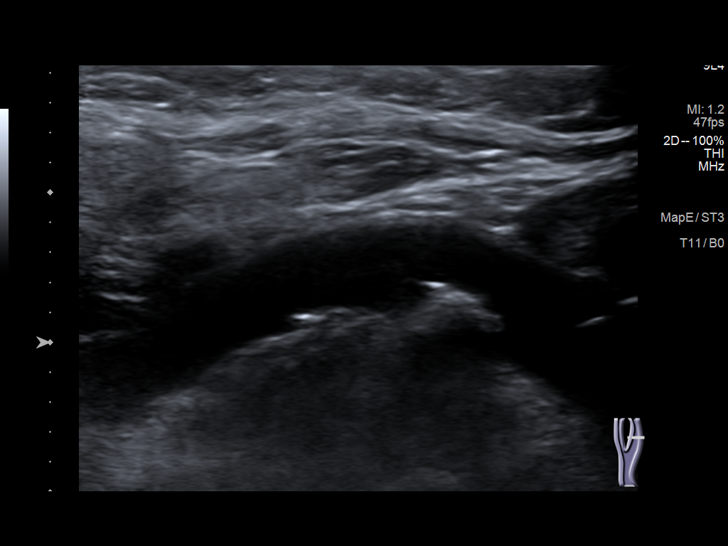
[im 106/106]
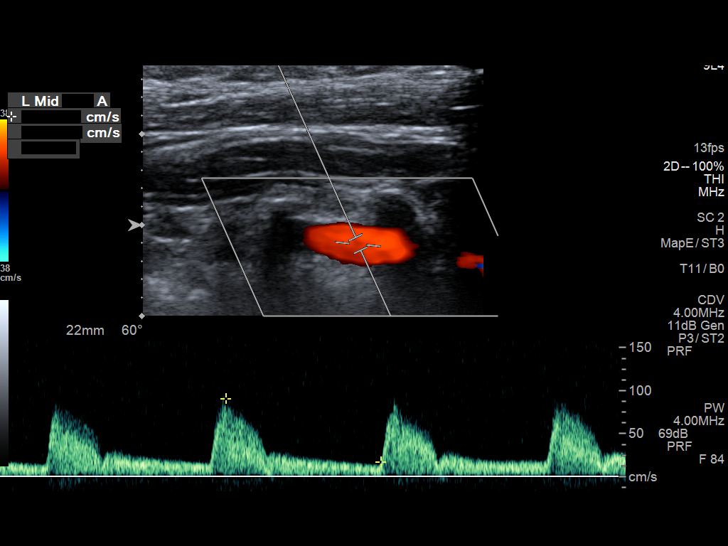

[13 of 24 positions shown; findings below may reference images not displayed]

FINDINGS: Criteria: Quantification of carotid stenosis is based on velocity
parameters that correlate the residual internal carotid diameter
with NASCET-based stenosis levels, using the diameter of the distal
internal carotid lumen as the denominator for stenosis measurement.

The following velocity measurements were obtained:

RIGHT
ICA: 101/12 cm/sec
CCA: 111/15 cm/sec

SYSTOLIC ICA/CCA RATIO:

ECA:  133 cm/sec

LEFT

ICA: 94/16 cm/sec

CCA: 90/18 cm/sec

SYSTOLIC ICA/CCA RATIO:

ECA:  113 cm/sec

RIGHT CAROTID ARTERY: Mild heterogeneous atherosclerotic plaque in
the proximal internal carotid artery. By peak systolic velocity
criteria, the estimated stenosis is less than 50%.

RIGHT VERTEBRAL ARTERY:  Patent with normal antegrade flow.

LEFT CAROTID ARTERY: Moderate heterogeneous and partially calcified
atherosclerotic plaque in the proximal internal carotid artery. By
peak systolic velocity criteria, the estimated stenosis is less than
50%.

LEFT VERTEBRAL ARTERY:  Patent with antegrade flow.
IMPRESSION: 1. Mild (1-49%) stenosis proximal right internal carotid artery
secondary to mild smooth heterogeneous atherosclerotic plaque.
2. Mild (1-49%) stenosis proximal left internal carotid artery
secondary to focal bulky heterogeneous atherosclerotic plaque.
3. Vertebral arteries are patent with normal antegrade flow.

## 2021-09-05 ENCOUNTER — Telehealth: Payer: Self-pay

## 2021-09-05 NOTE — Telephone Encounter (Signed)
Patient called about urine lab results. Please contact patient

## 2021-09-06 ENCOUNTER — Other Ambulatory Visit (HOSPITAL_COMMUNITY): Payer: Self-pay

## 2021-09-06 ENCOUNTER — Encounter (HOSPITAL_COMMUNITY): Payer: Self-pay | Admitting: Hematology

## 2021-09-06 ENCOUNTER — Telehealth: Payer: Self-pay

## 2021-09-07 ENCOUNTER — Ambulatory Visit: Payer: Medicare HMO | Admitting: Family Medicine

## 2021-09-07 NOTE — Telephone Encounter (Signed)
PA team advised per insurance no prior [name] is needed for [name]. Needs to be sent to DME supplier. MyChart message sent to pt advising order sent to [name] via [facility].

## 2021-09-08 ENCOUNTER — Other Ambulatory Visit: Payer: Self-pay | Admitting: Family Medicine

## 2021-09-08 DIAGNOSIS — I10 Essential (primary) hypertension: Secondary | ICD-10-CM

## 2021-09-09 DIAGNOSIS — E1159 Type 2 diabetes mellitus with other circulatory complications: Secondary | ICD-10-CM | POA: Diagnosis not present

## 2021-09-09 DIAGNOSIS — J449 Chronic obstructive pulmonary disease, unspecified: Secondary | ICD-10-CM | POA: Diagnosis not present

## 2021-09-09 DIAGNOSIS — I10 Essential (primary) hypertension: Secondary | ICD-10-CM

## 2021-09-10 ENCOUNTER — Other Ambulatory Visit: Payer: Self-pay | Admitting: Family Medicine

## 2021-09-10 DIAGNOSIS — I10 Essential (primary) hypertension: Secondary | ICD-10-CM | POA: Diagnosis not present

## 2021-09-10 DIAGNOSIS — J449 Chronic obstructive pulmonary disease, unspecified: Secondary | ICD-10-CM | POA: Diagnosis not present

## 2021-09-12 DIAGNOSIS — I5032 Chronic diastolic (congestive) heart failure: Secondary | ICD-10-CM | POA: Diagnosis not present

## 2021-09-12 DIAGNOSIS — N189 Chronic kidney disease, unspecified: Secondary | ICD-10-CM | POA: Diagnosis not present

## 2021-09-12 DIAGNOSIS — E1122 Type 2 diabetes mellitus with diabetic chronic kidney disease: Secondary | ICD-10-CM | POA: Diagnosis not present

## 2021-09-12 DIAGNOSIS — N170 Acute kidney failure with tubular necrosis: Secondary | ICD-10-CM | POA: Diagnosis not present

## 2021-09-12 DIAGNOSIS — I129 Hypertensive chronic kidney disease with stage 1 through stage 4 chronic kidney disease, or unspecified chronic kidney disease: Secondary | ICD-10-CM | POA: Diagnosis not present

## 2021-09-12 DIAGNOSIS — D638 Anemia in other chronic diseases classified elsewhere: Secondary | ICD-10-CM | POA: Diagnosis not present

## 2021-09-12 DIAGNOSIS — R809 Proteinuria, unspecified: Secondary | ICD-10-CM | POA: Diagnosis not present

## 2021-09-12 DIAGNOSIS — E1129 Type 2 diabetes mellitus with other diabetic kidney complication: Secondary | ICD-10-CM | POA: Diagnosis not present

## 2021-09-14 ENCOUNTER — Ambulatory Visit (INDEPENDENT_AMBULATORY_CARE_PROVIDER_SITE_OTHER): Payer: Medicare HMO | Admitting: Psychiatry

## 2021-09-14 ENCOUNTER — Telehealth (HOSPITAL_COMMUNITY): Payer: Self-pay | Admitting: Psychiatry

## 2021-09-14 ENCOUNTER — Telehealth: Payer: Self-pay

## 2021-09-14 ENCOUNTER — Other Ambulatory Visit: Payer: Self-pay | Admitting: Family Medicine

## 2021-09-14 DIAGNOSIS — Z79899 Other long term (current) drug therapy: Secondary | ICD-10-CM

## 2021-09-14 DIAGNOSIS — F331 Major depressive disorder, recurrent, moderate: Secondary | ICD-10-CM

## 2021-09-14 DIAGNOSIS — R296 Repeated falls: Secondary | ICD-10-CM

## 2021-09-14 DIAGNOSIS — E1165 Type 2 diabetes mellitus with hyperglycemia: Secondary | ICD-10-CM | POA: Diagnosis not present

## 2021-09-14 DIAGNOSIS — E1143 Type 2 diabetes mellitus with diabetic autonomic (poly)neuropathy: Secondary | ICD-10-CM

## 2021-09-14 IMAGING — CT CT HEAD W/O CM
3 series · 15 of 47 positions shown, 18 images · non-contrast
Comparison: 05/01/2015

CLINICAL DATA: Altered mental status

EXAM:
CT HEAD WITHOUT CONTRAST
CT CERVICAL SPINE WITHOUT CONTRAST
TECHNIQUE: Multidetector CT imaging of the head and cervical spine was
performed following the standard protocol without intravenous
contrast. Multiplanar CT image reconstructions of the cervical spine
were also generated.

[Series 2: head w o · axial · 0.48mm/px · z∈[-2,+128]mm · 9 of 32 slices shown, 12 images]
[im 3/32  brain]
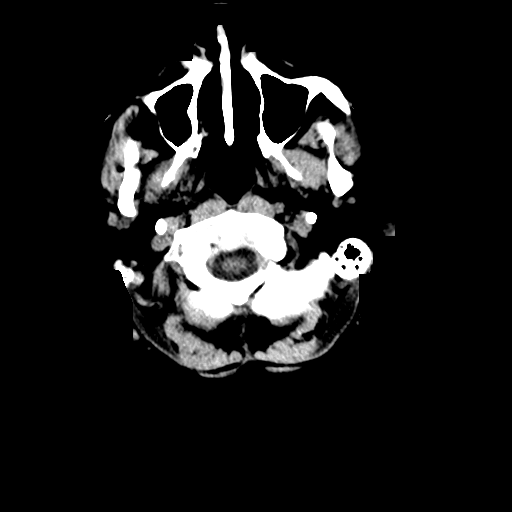
[im 3/32  bone]
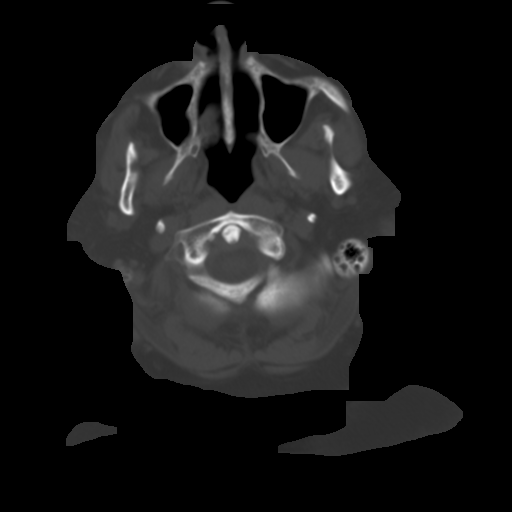
[im 6/32  brain]
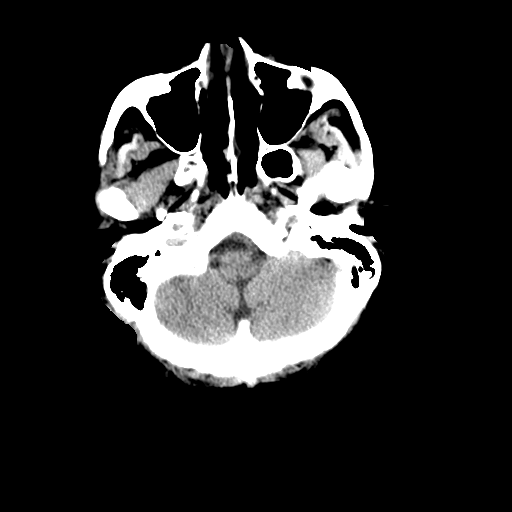
[im 9/32  brain]
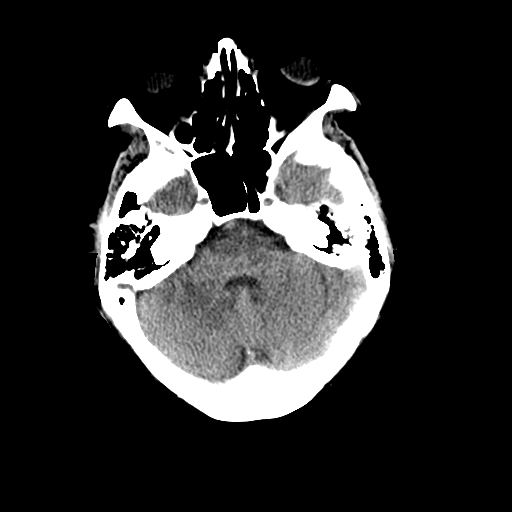
[im 12/32  brain]
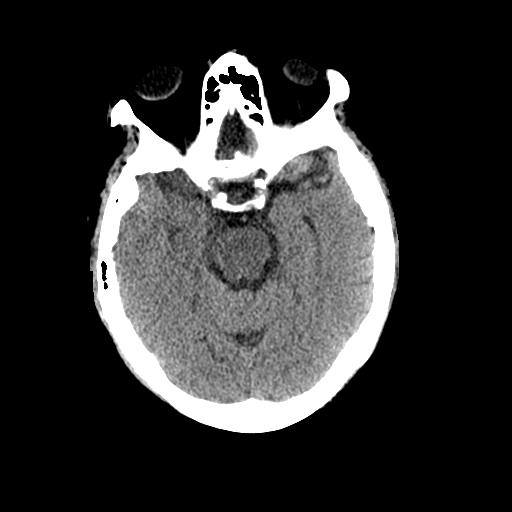
[im 17/32  brain]
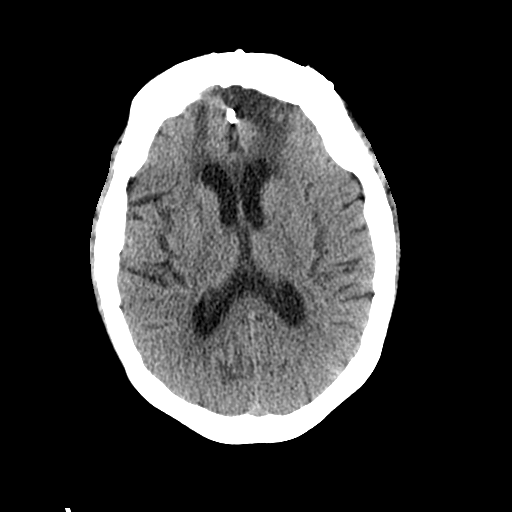
[im 17/32  bone]
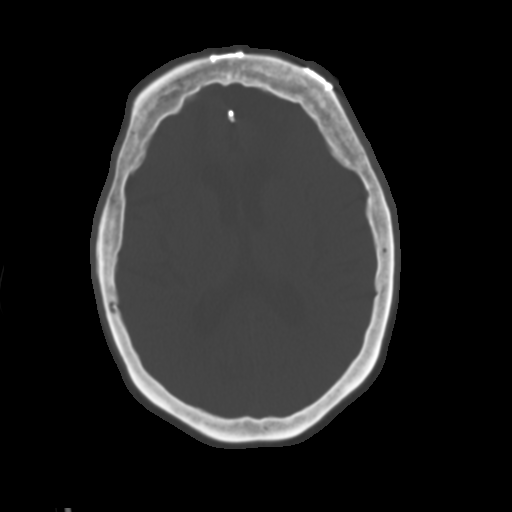
[im 20/32  brain]
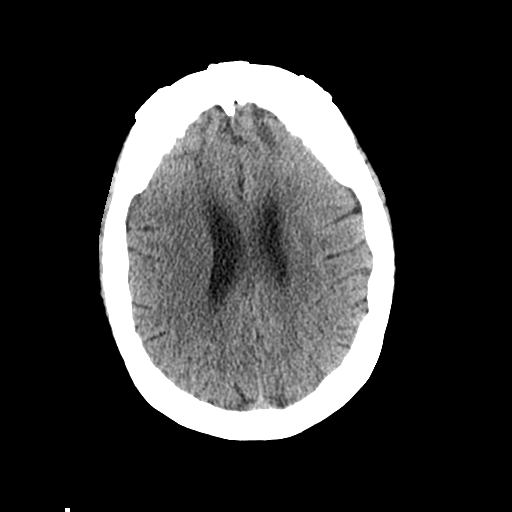
[im 23/32  brain]
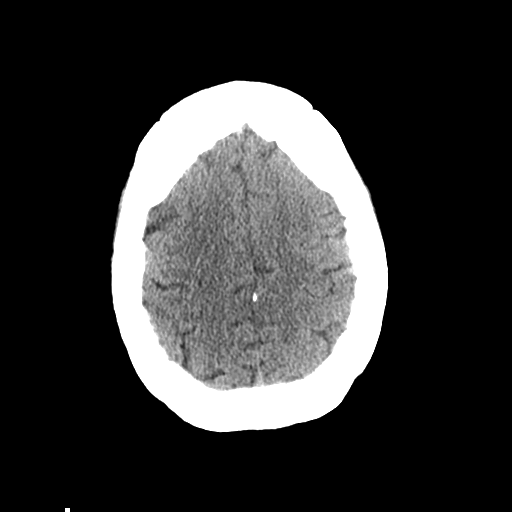
[im 26/32  brain]
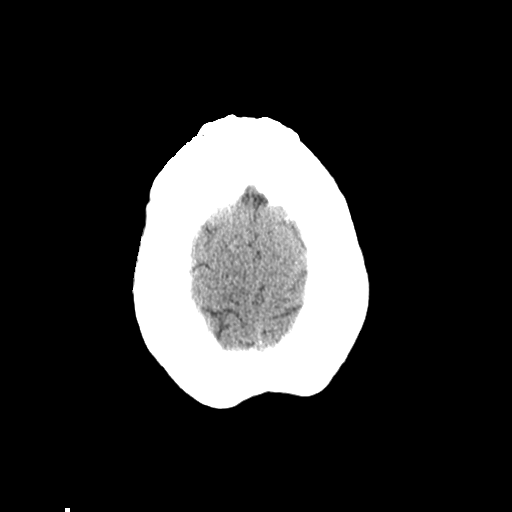
[im 29/32  brain]
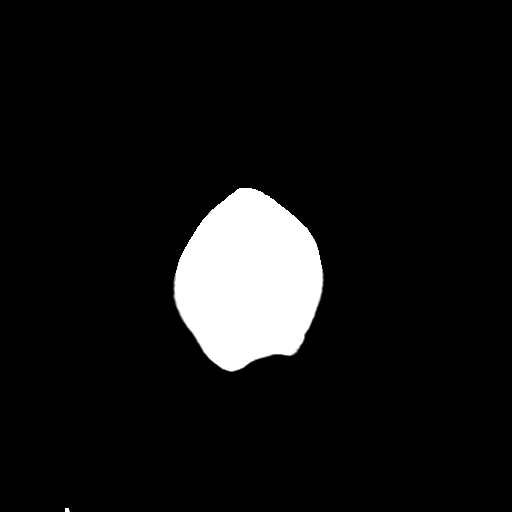
[im 29/32  bone]
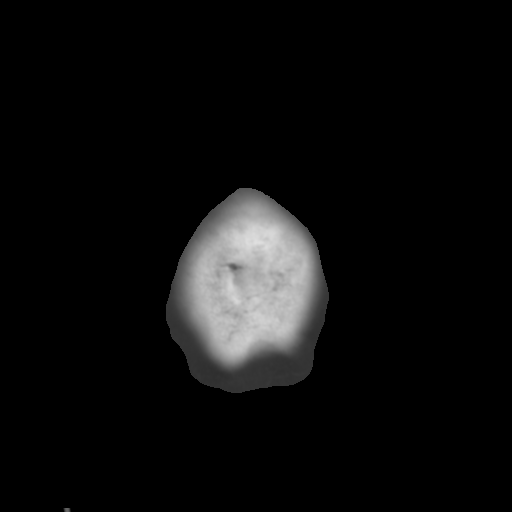

[Series 4: coronal soft · coronal · 0.32mm/px · 3 of 71 slices shown]
[im 24/71  brain]
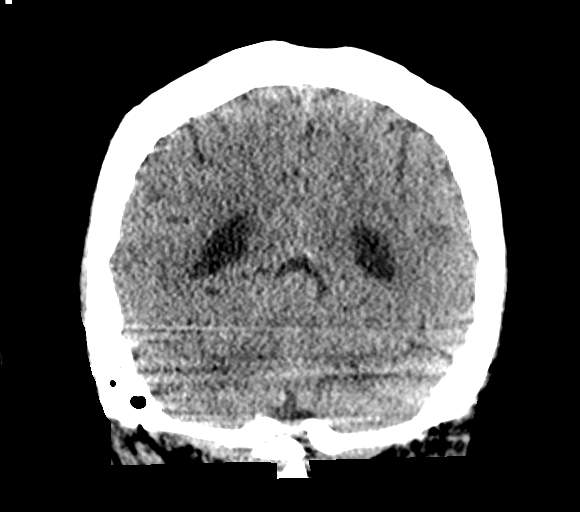
[im 32/71  brain]
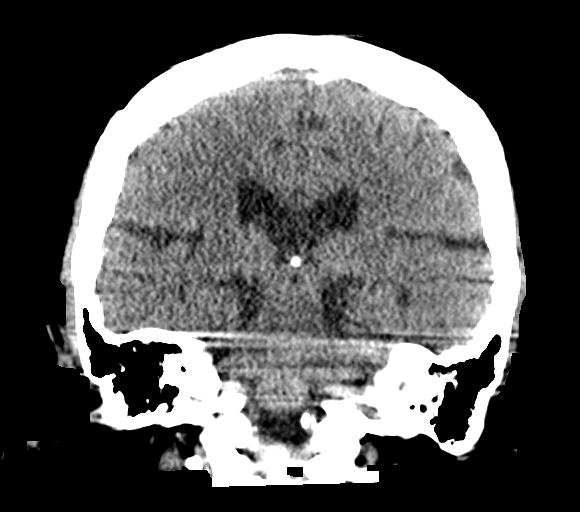
[im 39/71  brain]
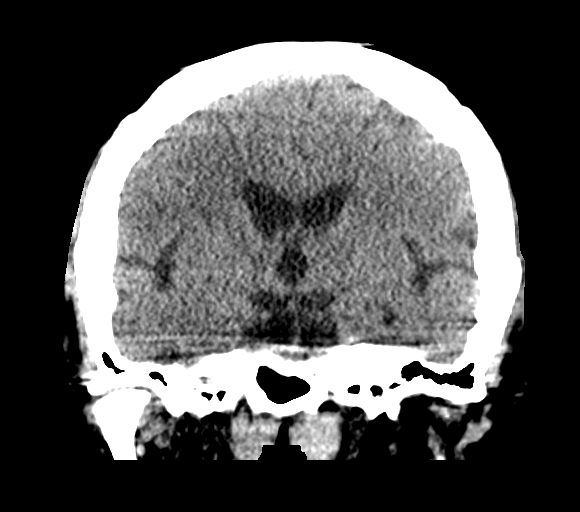

[Series 5: sagittal soft · sagittal · 0.33mm/px · 3 of 63 slices shown]
[im 21/63  brain]
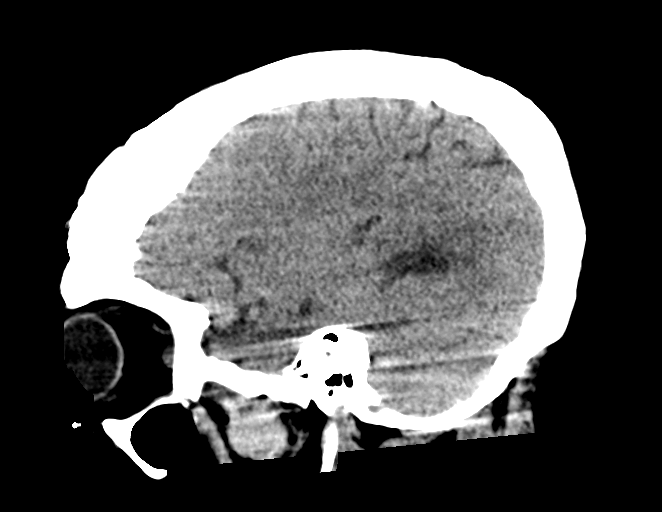
[im 32/63  brain]
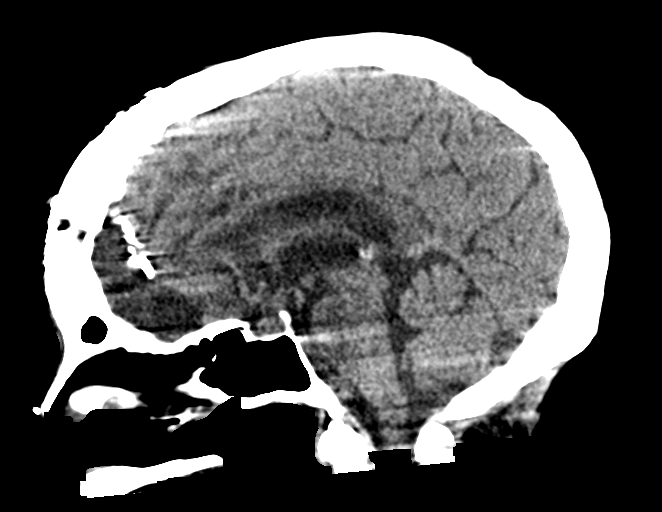
[im 42/63  brain]
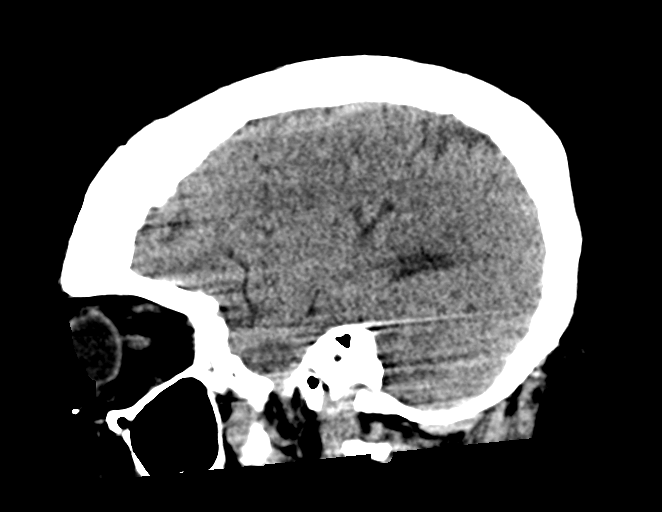

[15 of 47 positions shown; findings below may reference images not displayed]

FINDINGS: CT HEAD FINDINGS

Brain: No evidence of acute infarction, hemorrhage, hydrocephalus,
extra-axial collection or mass lesion/mass effect. There is chronic
encephalomalacia of the left greater than right frontal poles with
overlying craniotomy and clip material in the vicinity of the
anterior cerebral arteries.

Vascular: No hyperdense vessel or unexpected calcification.

Skull: Status post frontal craniotomy. Negative for fracture or
focal lesion.

Sinuses/Orbits: No acute finding.

Other: None.

CT CERVICAL SPINE FINDINGS

Alignment: Normal.

Skull base and vertebrae: No acute fracture. No primary bone lesion
or focal pathologic process.

Soft tissues and spinal canal: No prevertebral fluid or swelling. No
visible canal hematoma.

Disc levels: Mild multilevel disc space height loss and
osteophytosis.

Upper chest: Negative.

Other: None.
IMPRESSION: 1. No acute intracranial pathology.
2. There is unchanged, chronic encephalomalacia of the left greater
than right frontal poles with overlying craniotomy and clip material
in the vicinity of the anterior cerebral arteries.
3. No fracture or static subluxation of the cervical spine.

## 2021-09-14 IMAGING — DX DG ANKLE COMPLETE 3+V*R*
3 series · 3 of 3 positions shown · non-contrast
Comparison: None.

CLINICAL DATA: Fell last night.  Right ankle pain and swelling.

EXAM:
RIGHT ANKLE - COMPLETE 3+ VIEW

[ankle ap]
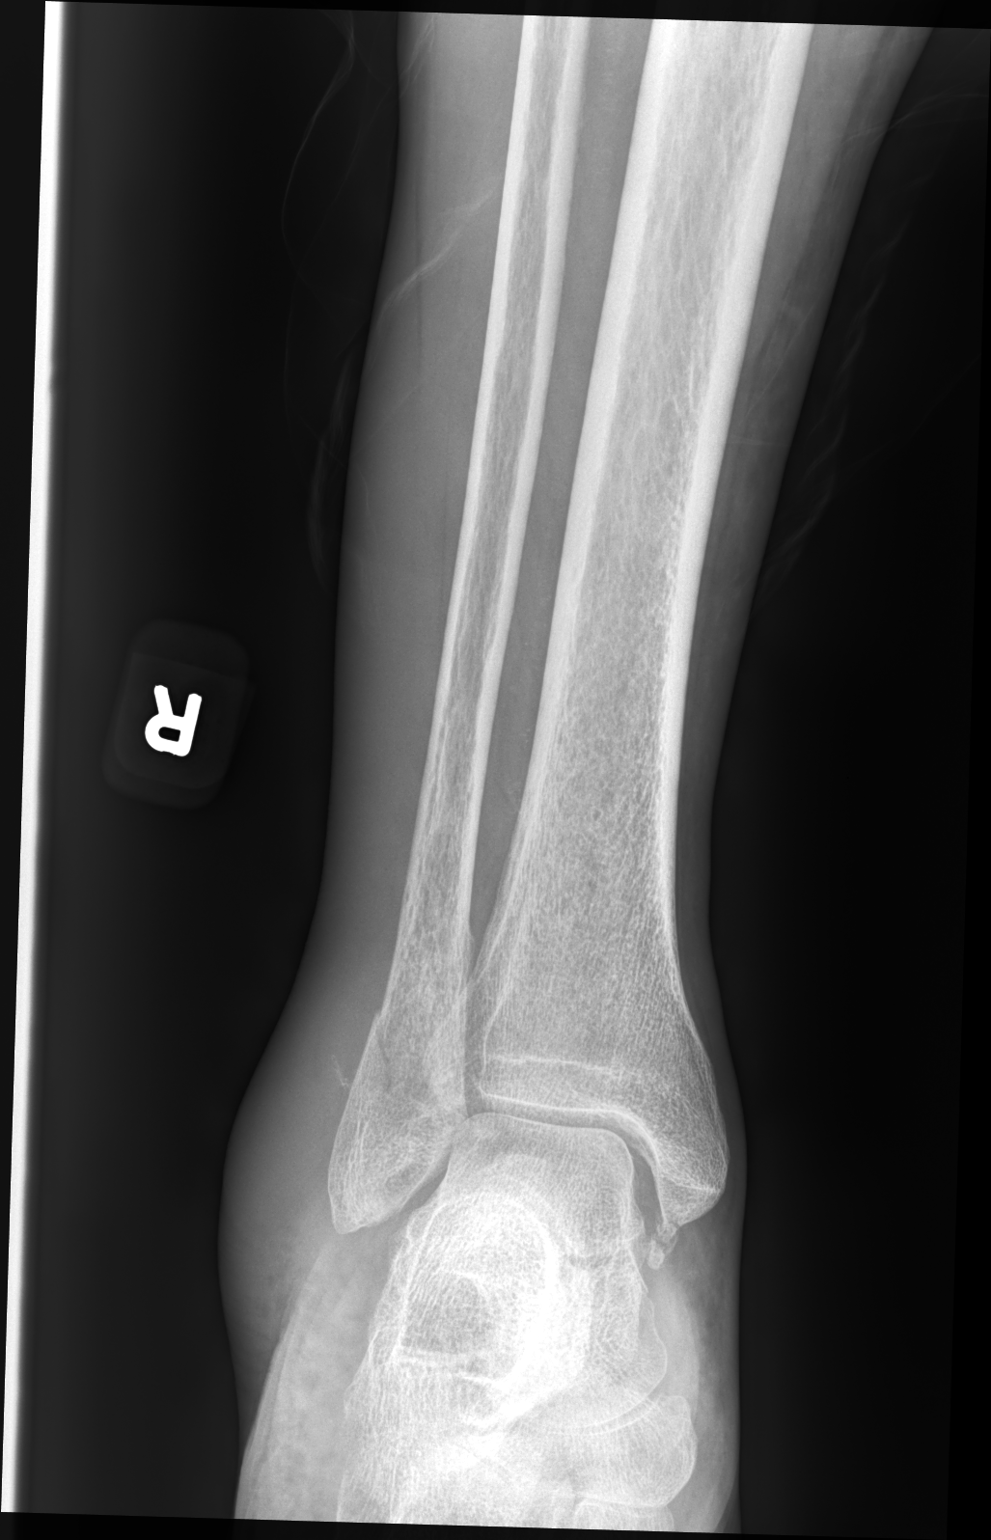

[ankle mlo]
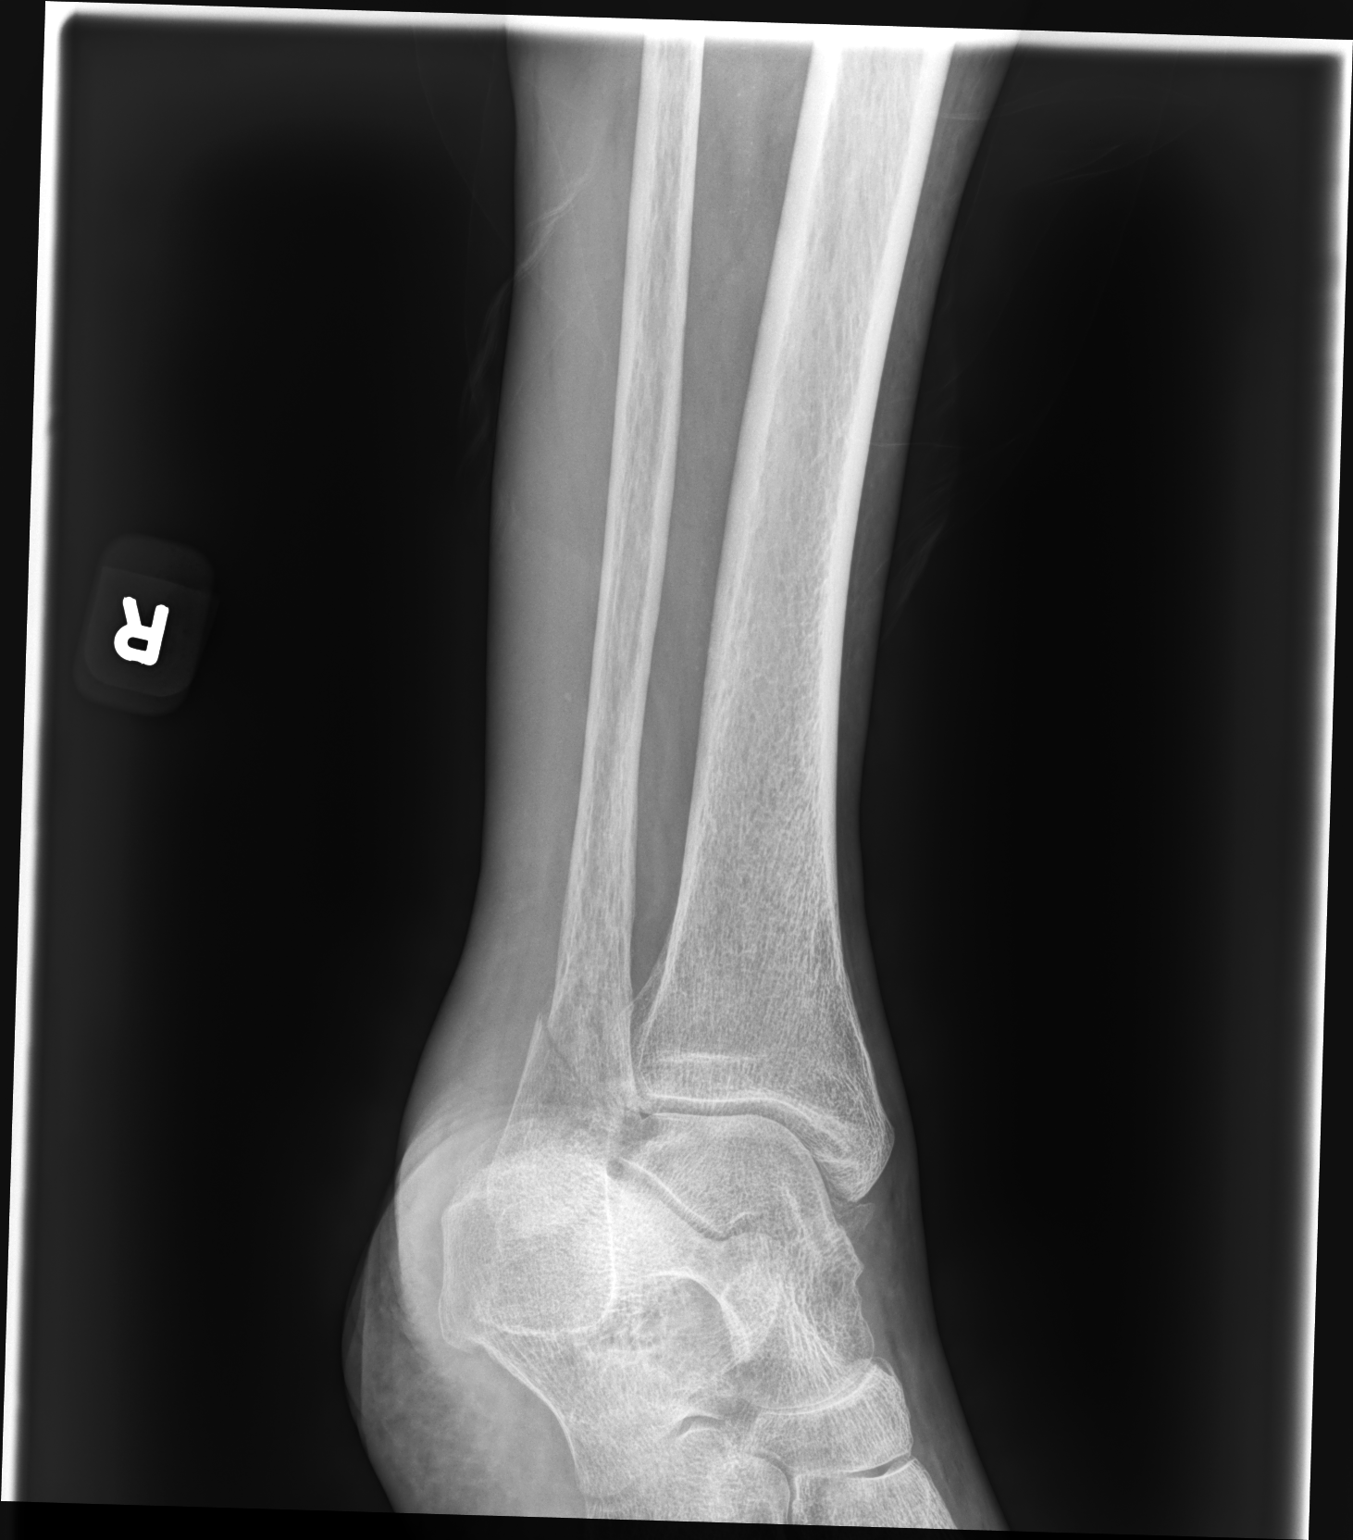

[ankle lat]
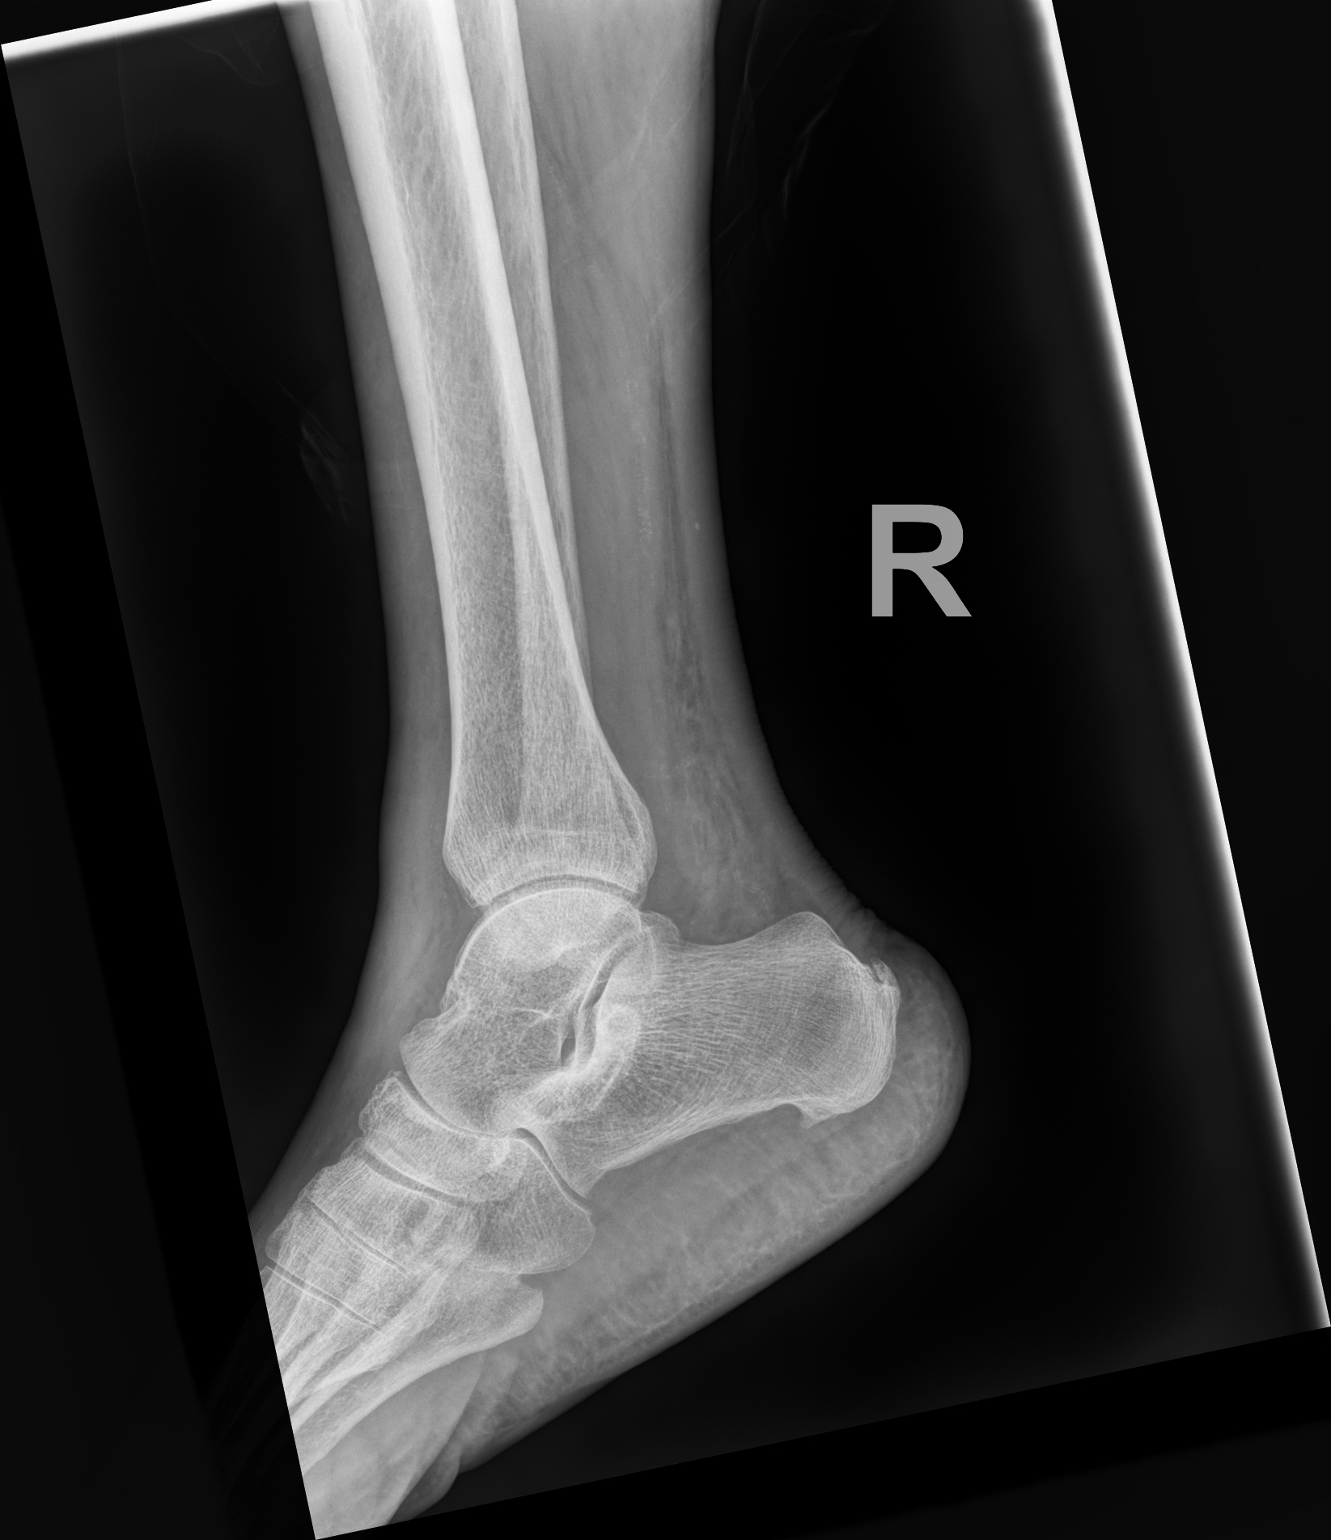

[3 of 3 positions shown; findings below may reference images not displayed]

FINDINGS: Oblique, nondisplaced, non comminuted fracture of the distal fibula
extending from the posterolateral metadiaphysis to the medial
metaphysis at the level of the ankle joint.

No other fractures.  No bone lesions.

Ankle joint normally spaced and aligned.

Small plantar and dorsal calcaneal spurs.

Soft tissue swelling that predominates laterally.
IMPRESSION: 1. Nondisplaced, non comminuted fracture of the distal fibula.
Normally aligned ankle joint.

## 2021-09-14 IMAGING — DX DG CHEST 1V
1 series · 1 of 1 positions shown · non-contrast
Comparison: January 09, 2019

CLINICAL DATA: Altered mental status.

EXAM:
CHEST  1 VIEW

[chest pa]
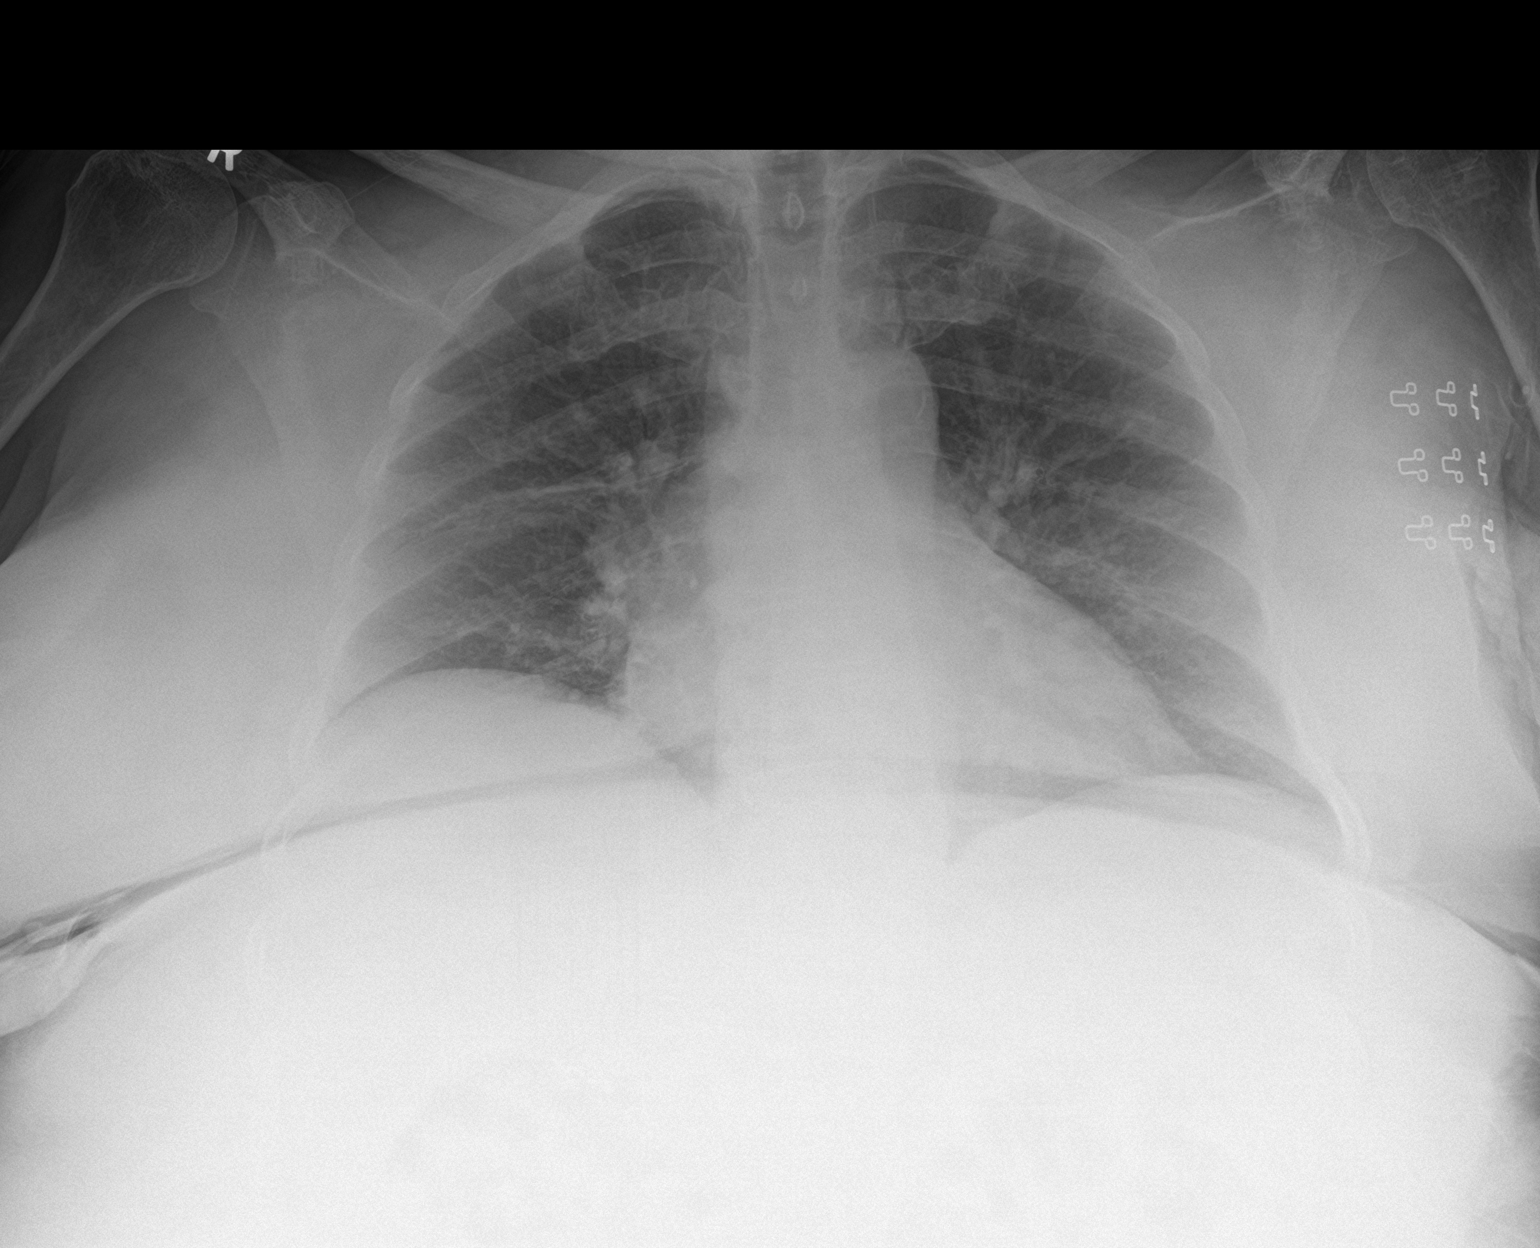

[1 of 1 positions shown; findings below may reference images not displayed]

FINDINGS: The heart size and mediastinal contours are within normal limits.
Very mild calcification of the aortic arch is seen. Both lungs are
clear. Degenerative changes are noted within the thoracic spine.
Radiopaque surgical clips are seen overlying the right upper
quadrant.
IMPRESSION: No active disease.

## 2021-09-14 NOTE — Telephone Encounter (Signed)
Lauren/[facility] called in stating that this pt will need a new referral sent out to [facility] due to [facility] not covering this service any more.  Please fax referral to [phone]

## 2021-09-14 NOTE — Telephone Encounter (Signed)
Therapist attempted to contact patient via phone for scheduled appointment and received voicemail recording.  Therapist left message indicating attempt and requesting patient call office.

## 2021-09-14 NOTE — Telephone Encounter (Signed)
Patient called asking if walker has been sent in to [facility] yet. Please contact patient back [phone]

## 2021-09-14 NOTE — Telephone Encounter (Signed)
Pt called in stating that she just wanted to make pcp aware that she feel this morning. Pt states that she is ok and that her aide assisted her in getting up, but she does have some tingling in her side. Please advise.  Cb#: [phone]

## 2021-09-14 NOTE — Telephone Encounter (Signed)
This has been sent through [facility] to [facility]. They should be contacting her before delivery

## 2021-09-14 NOTE — Progress Notes (Signed)
Virtual Visit via Telephone Note  I connected with [name] on 09/14/21 at 4:03 PM EDT  by telephone and verified that I am speaking with the correct person using two identifiers.  Location: Patient: Home Provider: [place] office    I discussed the limitations, risks, security and privacy concerns of performing an evaluation and management service by telephone and the availability of in person appointments. I also discussed with the patient that there may be a patient responsible charge related to this service. The patient expressed understanding and agreed to proceed.   I provided 40 minutes of non-face-to-face time during this encounter.   [name], LCSW            THERAPIST PROGRESS NOTE    Session Time: Wednesday 09/14/2021 4:03 PM - 4:43 PM   Participation Level: Active  Behavioral Response: less depressed, talkative, alert   Type of Therapy: Individual Therapy  Treatment Goals addressed: Patient will score less than 10 on the patient health questionnaire  Progress on goals: Progressing  Interventions: CBT and Supportive             Summary: [name] is a 71 y.o. female who presents with  long standing history of recurrent periods  of depression beginning when she was 39 and her favorite uncle died. Patient reports multiple psychiatric hospitlaizations due to depression and suicidal ideations with the last one occuring in 1997. Patient has participated in outpatient psychotherapy and medication management intermittently since age 72.  She currently is seeing psychiatrist Dr. Harrington Challenger . Prior to this, she was seen at [facility]. Patient also has had ECT at [facility]. Symptoms have worsened in recent months due to family stress and have  included depressed mood, anxiety, excessive worry, and tearfulness.            Patient's last contact was by virtual visit via telephone about 3 weeks ago.  She reports being less depressed and feeling a little  better.  She reports coping better since last session.  She continues to listen to music and watch TV.  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  She also reports recently going out to dinner with a friend.  Patient reports enjoying this very much.  She maintains daily phone contact with 2 or 3 friends.  She reports still feeling lonely at times mainly after church service.  Per patient's report other people go out to dinner after church but she usually is home alone.  She reports stress regarding recent notification from her life insurance carrier that her premium will be increased.  She expresses frustration as she wants help from her POA who is her niece to explain the insurance agreement.  However, needs has told patient she will not help her with this per patient's report.    Suicidal/Homicidal: Nowithout intent/plan      Therapist Response: Reviewed symptoms, discussed stressors, facilitated expression of thoughts and feelings, validated feelings, praised and reinforced patient's continued efforts to use helpful coping strategies, discussed effects on mood, assisted patient identify ways to be assertive and use initiative to increase social contact, developed plan with patient to consider inviting a friend over to her home on a Sunday afternoon, also discussed possibility of patient asking another person in her support system to help her with reviewing the insurance agreement   Diagnosis: Axis I: MDD, Recurrent, moderate    Axis II: Deferred Collaboration of Care: Psychiatrist AEB  patient working with psychiatrist Dr. Harrington Challenger  Patient/Guardian was advised Release of Information must be obtained prior to any record release in order to collaborate their care with an outside provider. Patient/Guardian was advised if they have not already done so to contact the registration department to sign all necessary forms in order for [name] to release information regarding  their care.   Consent: Patient/Guardian gives verbal consent for treatment and assignment of benefits for services provided during this visit. Patient/Guardian expressed understanding and agreed to proceed.    Dimitri Dsouza E Meris Reede, LCSW

## 2021-09-15 ENCOUNTER — Emergency Department (HOSPITAL_COMMUNITY): Payer: Medicare HMO

## 2021-09-15 ENCOUNTER — Telehealth: Payer: Self-pay | Admitting: Family Medicine

## 2021-09-15 ENCOUNTER — Other Ambulatory Visit: Payer: Self-pay

## 2021-09-15 ENCOUNTER — Inpatient Hospital Stay (HOSPITAL_COMMUNITY)
Admission: EM | Admit: 2021-09-15 | Discharge: 2021-09-17 | DRG: 683 | Disposition: A | Discharge status: Home Health | Payer: Medicare HMO | Attending: Family Medicine | Admitting: Family Medicine

## 2021-09-15 ENCOUNTER — Encounter (HOSPITAL_COMMUNITY): Payer: Self-pay | Admitting: Emergency Medicine

## 2021-09-15 DIAGNOSIS — H409 Unspecified glaucoma: Secondary | ICD-10-CM | POA: Diagnosis present

## 2021-09-15 DIAGNOSIS — Z7982 Long term (current) use of aspirin: Secondary | ICD-10-CM

## 2021-09-15 DIAGNOSIS — R9431 Abnormal electrocardiogram [ECG] [EKG]: Secondary | ICD-10-CM

## 2021-09-15 DIAGNOSIS — W1839XA Other fall on same level, initial encounter: Secondary | ICD-10-CM | POA: Diagnosis present

## 2021-09-15 DIAGNOSIS — R000 Tachycardia, unspecified: Secondary | ICD-10-CM | POA: Diagnosis not present

## 2021-09-15 DIAGNOSIS — Z8673 Personal history of transient ischemic attack (TIA), and cerebral infarction without residual deficits: Secondary | ICD-10-CM

## 2021-09-15 DIAGNOSIS — R4781 Slurred speech: Secondary | ICD-10-CM | POA: Diagnosis present

## 2021-09-15 DIAGNOSIS — E039 Hypothyroidism, unspecified: Secondary | ICD-10-CM | POA: Diagnosis not present

## 2021-09-15 DIAGNOSIS — K219 Gastro-esophageal reflux disease without esophagitis: Secondary | ICD-10-CM | POA: Diagnosis present

## 2021-09-15 DIAGNOSIS — E860 Dehydration: Secondary | ICD-10-CM | POA: Diagnosis present

## 2021-09-15 DIAGNOSIS — J449 Chronic obstructive pulmonary disease, unspecified: Secondary | ICD-10-CM | POA: Diagnosis present

## 2021-09-15 DIAGNOSIS — Z8719 Personal history of other diseases of the digestive system: Secondary | ICD-10-CM

## 2021-09-15 DIAGNOSIS — D631 Anemia in chronic kidney disease: Secondary | ICD-10-CM | POA: Diagnosis not present

## 2021-09-15 DIAGNOSIS — F39 Unspecified mood [affective] disorder: Secondary | ICD-10-CM | POA: Diagnosis not present

## 2021-09-15 DIAGNOSIS — Z79899 Other long term (current) drug therapy: Secondary | ICD-10-CM

## 2021-09-15 DIAGNOSIS — I5032 Chronic diastolic (congestive) heart failure: Secondary | ICD-10-CM | POA: Diagnosis not present

## 2021-09-15 DIAGNOSIS — N189 Chronic kidney disease, unspecified: Secondary | ICD-10-CM | POA: Diagnosis not present

## 2021-09-15 DIAGNOSIS — I129 Hypertensive chronic kidney disease with stage 1 through stage 4 chronic kidney disease, or unspecified chronic kidney disease: Secondary | ICD-10-CM | POA: Diagnosis not present

## 2021-09-15 DIAGNOSIS — N390 Urinary tract infection, site not specified: Secondary | ICD-10-CM

## 2021-09-15 DIAGNOSIS — Z79890 Hormone replacement therapy (postmenopausal): Secondary | ICD-10-CM

## 2021-09-15 DIAGNOSIS — Z043 Encounter for examination and observation following other accident: Secondary | ICD-10-CM | POA: Diagnosis not present

## 2021-09-15 DIAGNOSIS — Z841 Family history of disorders of kidney and ureter: Secondary | ICD-10-CM

## 2021-09-15 DIAGNOSIS — H538 Other visual disturbances: Secondary | ICD-10-CM | POA: Diagnosis present

## 2021-09-15 DIAGNOSIS — E785 Hyperlipidemia, unspecified: Secondary | ICD-10-CM | POA: Diagnosis present

## 2021-09-15 DIAGNOSIS — M549 Dorsalgia, unspecified: Secondary | ICD-10-CM | POA: Diagnosis present

## 2021-09-15 DIAGNOSIS — Y92009 Unspecified place in unspecified non-institutional (private) residence as the place of occurrence of the external cause: Secondary | ICD-10-CM

## 2021-09-15 DIAGNOSIS — R809 Proteinuria, unspecified: Secondary | ICD-10-CM | POA: Diagnosis not present

## 2021-09-15 DIAGNOSIS — Z8249 Family history of ischemic heart disease and other diseases of the circulatory system: Secondary | ICD-10-CM

## 2021-09-15 DIAGNOSIS — N179 Acute kidney failure, unspecified: Principal | ICD-10-CM | POA: Diagnosis present

## 2021-09-15 DIAGNOSIS — R531 Weakness: Secondary | ICD-10-CM | POA: Diagnosis not present

## 2021-09-15 DIAGNOSIS — E1142 Type 2 diabetes mellitus with diabetic polyneuropathy: Secondary | ICD-10-CM | POA: Diagnosis not present

## 2021-09-15 DIAGNOSIS — Z888 Allergy status to other drugs, medicaments and biological substances status: Secondary | ICD-10-CM

## 2021-09-15 DIAGNOSIS — Z881 Allergy status to other antibiotic agents status: Secondary | ICD-10-CM

## 2021-09-15 DIAGNOSIS — Z880 Allergy status to penicillin: Secondary | ICD-10-CM

## 2021-09-15 DIAGNOSIS — Z794 Long term (current) use of insulin: Secondary | ICD-10-CM

## 2021-09-15 DIAGNOSIS — M1991 Primary osteoarthritis, unspecified site: Secondary | ICD-10-CM | POA: Diagnosis present

## 2021-09-15 DIAGNOSIS — E1165 Type 2 diabetes mellitus with hyperglycemia: Secondary | ICD-10-CM | POA: Diagnosis present

## 2021-09-15 DIAGNOSIS — N1832 Chronic kidney disease, stage 3b: Secondary | ICD-10-CM | POA: Diagnosis present

## 2021-09-15 DIAGNOSIS — G8929 Other chronic pain: Secondary | ICD-10-CM | POA: Diagnosis present

## 2021-09-15 DIAGNOSIS — W19XXXA Unspecified fall, initial encounter: Secondary | ICD-10-CM | POA: Diagnosis not present

## 2021-09-15 DIAGNOSIS — T148XXA Other injury of unspecified body region, initial encounter: Secondary | ICD-10-CM | POA: Diagnosis not present

## 2021-09-15 DIAGNOSIS — E1122 Type 2 diabetes mellitus with diabetic chronic kidney disease: Secondary | ICD-10-CM | POA: Diagnosis present

## 2021-09-15 DIAGNOSIS — S0990XA Unspecified injury of head, initial encounter: Secondary | ICD-10-CM | POA: Diagnosis not present

## 2021-09-15 DIAGNOSIS — F419 Anxiety disorder, unspecified: Secondary | ICD-10-CM | POA: Diagnosis present

## 2021-09-15 DIAGNOSIS — E875 Hyperkalemia: Secondary | ICD-10-CM | POA: Diagnosis not present

## 2021-09-15 DIAGNOSIS — F319 Bipolar disorder, unspecified: Secondary | ICD-10-CM | POA: Diagnosis present

## 2021-09-15 DIAGNOSIS — M797 Fibromyalgia: Secondary | ICD-10-CM | POA: Diagnosis present

## 2021-09-15 DIAGNOSIS — R739 Hyperglycemia, unspecified: Secondary | ICD-10-CM | POA: Diagnosis not present

## 2021-09-15 DIAGNOSIS — N3000 Acute cystitis without hematuria: Secondary | ICD-10-CM | POA: Diagnosis not present

## 2021-09-15 DIAGNOSIS — Z823 Family history of stroke: Secondary | ICD-10-CM

## 2021-09-15 DIAGNOSIS — N170 Acute kidney failure with tubular necrosis: Secondary | ICD-10-CM | POA: Diagnosis not present

## 2021-09-15 DIAGNOSIS — Z87891 Personal history of nicotine dependence: Secondary | ICD-10-CM

## 2021-09-15 DIAGNOSIS — Z91011 Allergy to milk products: Secondary | ICD-10-CM

## 2021-09-15 DIAGNOSIS — E1129 Type 2 diabetes mellitus with other diabetic kidney complication: Secondary | ICD-10-CM | POA: Diagnosis not present

## 2021-09-15 DIAGNOSIS — Z833 Family history of diabetes mellitus: Secondary | ICD-10-CM

## 2021-09-15 DIAGNOSIS — Z90710 Acquired absence of both cervix and uterus: Secondary | ICD-10-CM

## 2021-09-15 DIAGNOSIS — R262 Difficulty in walking, not elsewhere classified: Secondary | ICD-10-CM | POA: Diagnosis not present

## 2021-09-15 HISTORY — DX: Paralytic syndrome, unspecified: G83.9

## 2021-09-15 LAB — URINALYSIS, ROUTINE W REFLEX MICROSCOPIC
Bilirubin Urine: NEGATIVE
Glucose, UA: NEGATIVE mg/dL
Hgb urine dipstick: NEGATIVE
Ketones, ur: NEGATIVE mg/dL
Nitrite: NEGATIVE
Protein, ur: NEGATIVE mg/dL
Specific Gravity, Urine: 1.011 (ref 1.005–1.030)
pH: 5 (ref 5.0–8.0)

## 2021-09-15 LAB — BASIC METABOLIC PANEL
Anion gap: 5 (ref 5–15)
BUN: 80 mg/dL — ABNORMAL HIGH (ref 8–23)
CO2: 23 mmol/L (ref 22–32)
Calcium: 8.4 mg/dL — ABNORMAL LOW (ref 8.9–10.3)
Chloride: 108 mmol/L (ref 98–111)
Creatinine, Ser: 3.11 mg/dL — ABNORMAL HIGH (ref 0.44–1.00)
GFR, Estimated: 15 mL/min — ABNORMAL LOW (ref >60)
Glucose, Bld: 245 mg/dL — ABNORMAL HIGH (ref 70–99)
Potassium: 6.2 mmol/L — ABNORMAL HIGH (ref 3.5–5.1)
Sodium: 136 mmol/L (ref 135–145)

## 2021-09-15 LAB — CBG MONITORING, ED: Glucose-Capillary: 263 mg/dL — ABNORMAL HIGH (ref 70–99)

## 2021-09-15 LAB — CBC WITH DIFFERENTIAL/PLATELET
Abs Immature Granulocytes: 0.05 10*3/uL (ref 0.00–0.07)
Basophils Absolute: 0 10*3/uL (ref 0.0–0.1)
Basophils Relative: 0 %
Eosinophils Absolute: 0.1 10*3/uL (ref 0.0–0.5)
Eosinophils Relative: 2 %
HCT: 35.6 % — ABNORMAL LOW (ref 36.0–46.0)
Hemoglobin: 10.1 g/dL — ABNORMAL LOW (ref 12.0–15.0)
Immature Granulocytes: 1 %
Lymphocytes Relative: 24 %
Lymphs Abs: 1.6 10*3/uL (ref 0.7–4.0)
MCH: 22.5 pg — ABNORMAL LOW (ref 26.0–34.0)
MCHC: 28.4 g/dL — ABNORMAL LOW (ref 30.0–36.0)
MCV: 79.5 fL — ABNORMAL LOW (ref 80.0–100.0)
Monocytes Absolute: 0.6 10*3/uL (ref 0.1–1.0)
Monocytes Relative: 9 %
Neutro Abs: 4.4 10*3/uL (ref 1.7–7.7)
Neutrophils Relative %: 64 %
Platelets: 231 10*3/uL (ref 150–400)
RBC: 4.48 MIL/uL (ref 3.87–5.11)
RDW: 15.8 % — ABNORMAL HIGH (ref 11.5–15.5)
WBC: 6.9 10*3/uL (ref 4.0–10.5)
nRBC: 0 % (ref 0.0–0.2)

## 2021-09-15 MED ORDER — SODIUM ZIRCONIUM CYCLOSILICATE 5 G PO PACK
10.0000 g | PACK | Freq: Once | ORAL | Status: AC
Start: 2021-09-15 — End: 2021-09-15
  Administered 2021-09-15: 10 g via ORAL
  Filled 2021-09-15: qty 2

## 2021-09-15 MED ORDER — SODIUM CHLORIDE 0.9 % IV BOLUS
1000.0000 mL | Freq: Once | INTRAVENOUS | Status: AC
  Administered 2021-09-15: 1000 mL via INTRAVENOUS

## 2021-09-15 MED ORDER — FUROSEMIDE 10 MG/ML IJ SOLN
40.0000 mg | Freq: Once | INTRAMUSCULAR | Status: AC
  Administered 2021-09-15: 40 mg via INTRAVENOUS
  Filled 2021-09-15: qty 4

## 2021-09-15 MED ORDER — ALBUTEROL SULFATE (2.5 MG/3ML) 0.083% IN NEBU
10.0000 mg | INHALATION_SOLUTION | Freq: Once | RESPIRATORY_TRACT | Status: AC
  Administered 2021-09-15: 10 mg via RESPIRATORY_TRACT
  Filled 2021-09-15: qty 12

## 2021-09-15 MED ORDER — SODIUM BICARBONATE 8.4 % IV SOLN
50.0000 meq | Freq: Once | INTRAVENOUS | Status: AC
  Administered 2021-09-15: 50 meq via INTRAVENOUS

## 2021-09-15 MED ORDER — INSULIN ASPART 100 UNIT/ML IV SOLN
5.0000 [IU] | Freq: Once | INTRAVENOUS | Status: AC
  Administered 2021-09-15: 5 [IU] via INTRAVENOUS

## 2021-09-15 MED ORDER — CALCIUM GLUCONATE 10 % IV SOLN
1.0000 g | Freq: Once | INTRAVENOUS | Status: AC
Start: 2021-09-15 — End: 2021-09-15
  Administered 2021-09-15: 1 g via INTRAVENOUS
  Filled 2021-09-15: qty 10

## 2021-09-15 NOTE — ED Notes (Signed)
See triage notes. Pt a/o. Pt states she got up this am and was weak in both her legs. States she fell twice and hit forehead and it jerked her neck. Denies LOC. States she pushed her life line the second time for EMS. Pt has no pain to c spine area. But pain to palpation to left side of neck. Nad. Cbg in route over 300

## 2021-09-15 NOTE — ED Provider Notes (Signed)
[facility] Provider Note   CSN: [id number] Arrival date & time: 09/15/21  1725     History  Chief Complaint  Patient presents with   [name] is a 71 y.o. female.  HPI Patient presents for evaluation of falling as she was at home.  She presents by EMS.  She injured her head and neck in the fall.  She saw her renal doctor earlier today and was told she was doing okay so no interventions were undertaken.  Home Medications Prior to Admission medications   Medication Sig Start Date End Date Taking? Authorizing Provider  ACCU-CHEK GUIDE test strip TEST BLOOD SUGAR FOUR TIMES DAILY  AND AS NEEDED 05/30/21   Philemon Kingdom, MD  Accu-Chek Softclix Lancets lancets TEST BLOOD SUGAR THREE TIMES DAILY AS DIRECTED 03/28/21   Philemon Kingdom, MD  albuterol (VENTOLIN HFA) 108 (90 Base) MCG/ACT inhaler INHALE 1 TO 2 PUFFS EVERY 6 HOURS AS NEEDED FOR WHEEZING, SHORTNESS OF BREATH 06/24/21   Baird Lyons D, MD  Alcohol Swabs (DROPSAFE ALCOHOL PREP) 70 % PADS USE TO CLEAN FINGER PRIOR TO TESTING FOR BLOOD SUGAR  AS DIRECTED 05/20/21   Philemon Kingdom, MD  alendronate (FOSAMAX) 70 MG tablet TAKE 1 TABLET (70 MG TOTAL) BY MOUTH EVERY 7 (SEVEN) DAYS. TAKE WITH A FULL GLASS OF WATER ON AN EMPTY STOMACH. 02/04/21   [name], MD  amLODipine (NORVASC) 10 MG tablet TAKE 1 TABLET EVERY DAY 06/06/21   Imogene Burn, PA-C  ascorbic acid (VITAMIN C) 500 MG tablet Take 500 mg by mouth daily.    [provider]  aspirin EC 81 MG tablet Take 1 tablet (81 mg total) by mouth daily with breakfast. 12/08/18   Roxan Hockey, MD  betamethasone dipropionate 0.05 % cream  02/17/20   [provider]  Blood Glucose Calibration (ACCU-CHEK GUIDE CONTROL) LIQD USE AS DIRECTED 12/13/20   Philemon Kingdom, MD  blood glucose meter kit and supplies Dispense based on patient and insurance preference. Use up to four times daily as directed. (FOR ICD-10 E10.9, E11.9).  06/27/19   Philemon Kingdom, MD  buPROPion (WELLBUTRIN XL) 150 MG 24 hr tablet Take 1 tablet (150 mg total) by mouth every morning. 07/12/21 07/12/22  Cloria Spring, MD  Calcium Carb-Cholecalciferol (CALTRATE 600+D3 SOFT) 600-20 MG-MCG CHEW Chew 1 tablet by mouth daily at 12 noon.    [provider]  cetirizine (ZYRTEC) 10 MG tablet Take 1 tablet (10 mg total) by mouth daily. 04/25/21   Renee Rival, FNP  Cholecalciferol 50 MCG (2000 UT) TABS Take 1 tablet by mouth daily.    [provider]  Clobetasol Prop Emollient Base (CLOBETASOL PROPIONATE E) 0.05 % emollient cream Apply to affected area qd 01/24/21   Lavonna Monarch, MD  Continuous Blood Gluc Sensor (FREESTYLE LIBRE 14 DAY SENSOR) MISC 1 each by Does not apply route every 14 (fourteen) days. Change every 2 weeks 06/24/19   Philemon Kingdom, MD  diclofenac Sodium (VOLTAREN) 1 % GEL APPLY 2 GRAMS TOPICALLY FOUR TIMES DAILY. 06/20/21   Esterwood, Amy S, PA-C  Docusate Sodium (DSS) 100 MG CAPS Take by mouth.    [provider]  DROPLET PEN NEEDLES 31G X 8 MM MISC USE FOR INJECTING INSULIN 4 TIMES DAILY. 09/02/21   Philemon Kingdom, MD  ezetimibe (ZETIA) 10 MG tablet TAKE 1 TABLET EVERY DAY 06/22/21   [name], MD  famotidine (PEPCID) 40 MG tablet Take 40 mg  by mouth 2 (two) times daily. 08/30/20   [provider]  hydroxychloroquine (PLAQUENIL) 200 MG tablet Take by mouth.    [provider]  hydrOXYzine (ATARAX/VISTARIL) 10 MG tablet Take 1 tablet (10 mg total) by mouth 3 (three) times daily as needed. 08/03/20   [name], MD  insulin glargine, 2 Unit Dial, (TOUJEO MAX SOLOSTAR) 300 UNIT/ML Solostar Pen Inject 32 Units into the skin at bedtime. 06/28/21   Philemon Kingdom, MD  lamoTRIgine (LAMICTAL) 100 MG tablet Take 1 tablet (100 mg total) by mouth 2 (two) times daily. 07/12/21   Cloria Spring, MD  levothyroxine (SYNTHROID) 50 MCG tablet TAKE 1 TABLET EVERY DAY FOR 6 DAYS PER  WEEK, 1/2 TABLET 1 DAY PER WEEK 07/07/21   Philemon Kingdom, MD  LORazepam (ATIVAN) 0.5 MG tablet Take 1 tablet (0.5 mg total) by mouth 2 (two) times daily. 07/12/21   Cloria Spring, MD  meclizine (ANTIVERT) 25 MG tablet Take 1 tablet (25 mg total) by mouth 3 (three) times daily as needed for dizziness. 04/28/20   Noreene Larsson, NP  methocarbamol (ROBAXIN) 500 MG tablet Take one tablet by mouth two times daily , as needed, for neck and back pain and spasm 11/10/20   [name], MD  methylPREDNISolone (MEDROL DOSEPAK) 4 MG TBPK tablet Take as prescribed 07/15/21   Kommor, [place], MD  metoprolol tartrate (LOPRESSOR) 50 MG tablet TAKE 1 TABLET TWICE DAILY (NEED MD APPOINTMENT) 08/29/21   Ahmed Prima, Fransisco Hertz, PA-C  metroNIDAZOLE (FLAGYL) 250 MG tablet Take 1 tablet (250 mg total) by mouth 3 (three) times daily. 06/29/21   Pyrtle, Lajuan Lines, MD  mirabegron ER (MYRBETRIQ) 25 MG TB24 tablet Take 1 tablet (25 mg total) by mouth daily. 03/21/21   [name], MD  Misc. Devices (MATTRESS PAD) MISC FIRM MATTRESS PAD for hospital bed x 1 02/22/21   [name], MD  montelukast (SINGULAIR) 10 MG tablet TAKE 1 TABLET EVERY DAY 05/20/21   [name], MD  Multiple Vitamins-Minerals (MULTIVITAMIN GUMMIES ADULT PO) Take 1 tablet by mouth daily.    [provider]  Nicotine (NICOTROL NS) 10 MG/ML SOLN 1-5x/ hr x 8 weeks, then taper over 4-6 weeks to dc 06/02/21   Young, Clinton D, MD  NOVOLOG FLEXPEN 100 UNIT/ML FlexPen INJECT 16 TO 18 UNITS UNDER SKIN UP TO 3 TIMES A DAY BEFORE MEALS  (DISCARD PEN 28 DAYS AFTER OPENING) 09/02/21   Philemon Kingdom, MD  nystatin (MYCOSTATIN/NYSTOP) powder APPLY TO AFFECTED AREA 4 TIMES DAILY. 09/12/21   [name], MD  Omega-3 Fatty Acids (FISH OIL PO) Take 1 capsule by mouth daily.    [provider]  ondansetron (ZOFRAN) 4 MG tablet Take 1 tablet (4 mg total) by mouth every 8 (eight) hours as needed for nausea or vomiting. 07/19/21    Alvira Monday, FNP  oxyCODONE-acetaminophen (PERCOCET) 10-325 MG tablet Take 1 tablet by mouth every 8 (eight) hours as needed for pain. 11/01/20   [provider]  Plecanatide (TRULANCE) 3 MG TABS Take 3 mg by mouth daily as needed. 06/29/21   Pyrtle, Lajuan Lines, MD  pregabalin (LYRICA) 75 MG capsule Take 1 capsule (75 mg total) by mouth daily. 10/20/16   Rexene Alberts, MD  RABEprazole (ACIPHEX) 20 MG tablet Take 1 tablet (20 mg total) by mouth 2 (two) times daily. 06/29/21   Pyrtle, Lajuan Lines, MD  RESTASIS 0.05 % ophthalmic emulsion Place 2 drops into both eyes daily. 07/29/20  [provider]  risperiDONE (RISPERDAL) 0.5 MG tablet Take 1 tablet (0.5 mg total) by mouth at bedtime. 07/12/21   Cloria Spring, MD  sertraline (ZOLOFT) 100 MG tablet Take 1 tablet (100 mg total) by mouth every morning. 07/12/21   Cloria Spring, MD  spironolactone (ALDACTONE) 50 MG tablet Take 1 tablet (50 mg total) by mouth daily. 07/05/21   [name], MD  STIOLTO RESPIMAT 2.5-2.5 MCG/ACT AERS INHALE 2 PUFFS INTO THE LUNGS DAILY. Patient taking differently: Inhale 2 puffs into the lungs daily. 05/31/20   Parrett, Fonnie Mu, NP  traZODone (DESYREL) 150 MG tablet Take 1 tablet (150 mg total) by mouth at bedtime. 07/12/21   Cloria Spring, MD  UNABLE TO [facility] bed mattress x 1  DX: H65.79, J44.9 10/16/19   [name], MD  UNABLE TO FIND Standard wheelchair Dx [phone], [phone] 03/23/20   [name], MD  UNABLE TO FIND Med Name: Activice cooling gel spray on arms, legs, and back    [provider]  UNABLE TO [facility] bed with orthopedic mattress x 1 DX G47.33, G89.4 04/01/21   [name], MD      Allergies    Iron, Lac bovis, Penicillins, Phenazopyridine, Phenazopyridine hcl, Cephalexin, Flonase [fluticasone], Milk-related compounds, and Phenazopyridine hcl    Review of Systems   Review of Systems  Physical Exam Updated Vital Signs BP (!) 142/55   Pulse 76    Temp 98.5 F (36.9 C) (Oral)   Resp 16   Ht _0  (1.499 m)   Wt 73 kg   SpO2 100%   BMI 32.52 kg/m  Physical Exam Vitals and nursing note reviewed.  Constitutional:      General: She is in acute distress (She is sleepy, but arousable).     Appearance: She is well-developed. She is not ill-appearing.  HENT:     Head: Normocephalic and atraumatic.     Comments: No visible injury to face head or neck.    Right Ear: External ear normal.     Left Ear: External ear normal.  Eyes:     Conjunctiva/sclera: Conjunctivae normal.     Pupils: Pupils are equal, round, and reactive to light.  Neck:     Trachea: Phonation normal.  Cardiovascular:     Rate and Rhythm: Normal rate and regular rhythm.     Heart sounds: Normal heart sounds.  Pulmonary:     Effort: Pulmonary effort is normal.     Breath sounds: Normal breath sounds.  Abdominal:     General: There is no distension.     Palpations: Abdomen is soft.     Tenderness: There is no abdominal tenderness.  Musculoskeletal:        General: No swelling, deformity or signs of injury. Normal range of motion.     Cervical back: Normal range of motion and neck supple.  Skin:    General: Skin is warm and dry.     Coloration: Skin is not jaundiced or pale.  Neurological:     Mental Status: She is alert and oriented to person, place, and time.     Cranial Nerves: No cranial nerve deficit.     Sensory: No sensory deficit.     Motor: No abnormal muscle tone.     Coordination: Coordination normal.  Psychiatric:        Mood and Affect: Mood normal.        Speech: Speech normal. She is communicative.  Behavior: Behavior normal.        Cognition and Memory: Cognition normal. Cognition is not impaired. Memory is not impaired.        Judgment: Judgment normal.     ED Results / Procedures / Treatments   Labs (all labs ordered are listed, but only abnormal results are displayed) Labs Reviewed  CBC WITH DIFFERENTIAL/PLATELET -  Abnormal; Notable for the following components:      Result Value   Hemoglobin 10.1 (*)    HCT 35.6 (*)    MCV 79.5 (*)    MCH 22.5 (*)    MCHC 28.4 (*)    RDW 15.8 (*)    All other components within normal limits  BASIC METABOLIC PANEL - Abnormal; Notable for the following components:   Potassium 6.2 (*)    Glucose, Bld 245 (*)    BUN 80 (*)    Creatinine, Ser 3.11 (*)    Calcium 8.4 (*)    GFR, Estimated 15 (*)    All other components within normal limits  URINALYSIS, ROUTINE W REFLEX MICROSCOPIC - Abnormal; Notable for the following components:   Leukocytes,Ua MODERATE (*)    Bacteria, UA RARE (*)    All other components within normal limits  CBG MONITORING, ED - Abnormal; Notable for the following components:   Glucose-Capillary 263 (*)    All other components within normal limits  BASIC METABOLIC PANEL    EKG EKG Interpretation  Date/Time:  Thursday September 15 2021 17:41:48 EDT Ventricular Rate:  64 PR Interval:  230 QRS Duration: 105 QT Interval:  385 QTC Calculation: 398 R Axis:   50 Text Interpretation: Sinus rhythm Prolonged PR interval RSR' in V1 or V2, right VCD or RVH Nonspecific T abnormalities, lateral leads Baseline wander since last tracing no significant change Confirmed by Daleen Bo [phone]) on 09/15/2021 6:03:02 PM   EKG Interpretation  Date/Time:  Thursday September 15 2021 21:02:35 EDT Ventricular Rate:  144 PR Interval:  67 QRS Duration: 111 QT Interval:  317 QTC Calculation: 405 R Axis:   155 Text Interpretation: Sinus tachycardia Ventricular tachycardia, unsustained Short PR interval Consider right atrial enlargement Since last tracing of earlier today No significant change was found Confirmed by Daleen Bo [phone]) on 09/15/2021 11:38:36 PM          Radiology CT Cervical Spine Wo Contrast  Result Date: 09/15/2021 CLINICAL DATA:  Status post fall due to weakness. EXAM: CT CERVICAL SPINE WITHOUT CONTRAST TECHNIQUE: Multidetector CT imaging  of the cervical spine was performed without intravenous contrast. Multiplanar CT image reconstructions were also generated. RADIATION DOSE REDUCTION: This exam was performed according to the departmental dose-optimization program which includes automated exposure control, adjustment of the mA and/or kV according to patient size and/or use of iterative reconstruction technique. COMPARISON:  October 16, 2020 FINDINGS: Alignment: There is straightening of the normal cervical spine lordosis. Skull base and vertebrae: No acute fracture. No primary bone lesion or focal pathologic process. Soft tissues and spinal canal: No prevertebral fluid or swelling. No visible canal hematoma. Disc levels: Moderate to marked severity endplate sclerosis and moderate severity anterior osteophyte formation are seen at the levels of C4-C5, C5-C6, C6-C7 and C7-T1. There is moderate severity narrowing of the anterior atlantoaxial articulation. Moderate to marked severity intervertebral disc space narrowing is seen at C5-C6, C6-C7 and C7-T1. Mild, bilateral multilevel facet joint hypertrophy is noted. Upper chest: Negative. Other: None. IMPRESSION: 1. No acute fracture or subluxation of the cervical spine. 2. Moderate to  marked severity multilevel degenerative changes, as described above. Electronically Signed   By: Virgina Norfolk M.D.   On: 09/15/2021 22:26   CT Head Wo Contrast  Result Date: 09/15/2021 CLINICAL DATA:  Status post fall due to weakness. EXAM: CT HEAD WITHOUT CONTRAST TECHNIQUE: Contiguous axial images were obtained from the base of the skull through the vertex without intravenous contrast. RADIATION DOSE REDUCTION: This exam was performed according to the departmental dose-optimization program which includes automated exposure control, adjustment of the mA and/or kV according to patient size and/or use of iterative reconstruction technique. COMPARISON:  November 24, 2020 FINDINGS: Brain: There is mild cerebral atrophy  with widening of the extra-axial spaces and ventricular dilatation. There are areas of decreased attenuation within the white matter tracts of the supratentorial brain, consistent with microvascular disease changes. Stable areas of bifrontal encephalomalacia are noted. A small amount of residual meningioma is again noted within the planum sphenoidale. Vascular: Bilateral cavernous carotid artery calcification is present. Skull: A midline frontal craniotomy defect is seen. Sinuses/Orbits: No acute finding. Other: None. IMPRESSION: 1. Generalized cerebral atrophy and chronic white matter small vessel ischemic changes, without an acute intracranial abnormality. 2. 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 3. Stable small amount of residual meningioma within the planum sphenoidale. Electronically Signed   By: Virgina Norfolk M.D.   On: 09/15/2021 22:22    Procedures Procedures    Medications Ordered in ED Medications  sodium chloride 0.9 % bolus 1,000 mL (0 mLs Intravenous Stopped 09/15/21 2343)  sodium zirconium cyclosilicate (LOKELMA) packet 10 g (10 g Oral Given 09/15/21 2050)  furosemide (LASIX) injection 40 mg (40 mg Intravenous Given 09/15/21 2139)  calcium gluconate inj 10% (1 g) URGENT USE ONLY! (1 g Intravenous Given 09/15/21 2139)  albuterol (PROVENTIL) (2.5 MG/3ML) 0.083% nebulizer solution 10 mg (10 mg Nebulization Given 09/15/21 2229)  sodium bicarbonate injection 50 mEq (50 mEq Intravenous Given 09/15/21 2150)  insulin aspart (novoLOG) injection 5 Units (5 Units Intravenous Given 09/15/21 2150)    ED Course/ Medical Decision Making/ A&P                           Medical Decision Making Patient complains of general weakness, which caused her to fall today injuring her forehead and neck.  She lives alone and called EMS who transferred her here.  She saw her renal doctor today for checkup and he felt she was stable for discharge.  Amount and/or Complexity of Data  Reviewed Independent Historian:     Details: She is a cogent historian External Data Reviewed: notes.    Details: Nephrology clinic visit today.  Patient felt to be stable. Labs: ordered.    Details: CBC, metabolic panel, urinalysis-normal except potassium high, creatinine high, BUN high, glucose high, GFR low Radiology: ordered and independent interpretation performed.    Details: CT cervical spine, CT head-degenerative joint changes cervical spine no intracranial injury. ECG/medicine tests: ordered and independent interpretation performed.    Details: Cardiac monitor-normal sinus rhythm Discussion of management or test interpretation with external provider(s): Consult hospitalist to admit for management.  Risk OTC drugs. Prescription drug management. Decision regarding hospitalization. Risk Details: Patient presenting with weakness which caused her to fall.  She is found to be in acute renal failure which is acute on chronic.  Potassium markedly elevated.  EKG abnormalities felt to be secondary to potassium being high.  She is hemodynamically stable.  She was treated with IV fluids,  Lokelma, calcium gluconate, Lasix, sodium bicarb.  She remained stable in the ED. patient had 3 EKGs done.  Initial EKG was fairly normal.  Repeat EKG showed ST elevation inferiorly, this likely due to hyperkalemia.  I do not think this represents an acute coronary syndrome or acute MI.  Repeat BMP ordered  Critical Care Total time providing critical care: 55 minutes           Final Clinical Impression(s) / ED Diagnoses Final diagnoses:  AKI (acute kidney injury) ([place])  Hyperkalemia  Weakness  Fall, initial encounter  Injury of head, initial encounter    Rx / DC Orders ED Discharge Orders     None         Daleen Bo, MD 09/16/21 0005

## 2021-09-15 NOTE — Telephone Encounter (Signed)
[name], with [facility] states pt is scheduled to get started as of July 17 for home health services.

## 2021-09-15 NOTE — ED Notes (Signed)
Pt sleeping/lethargic, but awakens to name and is a/o.

## 2021-09-15 NOTE — ED Triage Notes (Addendum)
Pt to the ED with [facility] from home with a complaint of a fall due to weakness. Pt states she hit her head and has neck pain.  Pt denies LOC and is not on any blood thinners.

## 2021-09-16 ENCOUNTER — Encounter (HOSPITAL_COMMUNITY): Payer: Self-pay | Admitting: Family Medicine

## 2021-09-16 DIAGNOSIS — E1142 Type 2 diabetes mellitus with diabetic polyneuropathy: Secondary | ICD-10-CM

## 2021-09-16 DIAGNOSIS — Z8673 Personal history of transient ischemic attack (TIA), and cerebral infarction without residual deficits: Secondary | ICD-10-CM | POA: Diagnosis not present

## 2021-09-16 DIAGNOSIS — E860 Dehydration: Secondary | ICD-10-CM | POA: Diagnosis present

## 2021-09-16 DIAGNOSIS — R531 Weakness: Secondary | ICD-10-CM

## 2021-09-16 DIAGNOSIS — H538 Other visual disturbances: Secondary | ICD-10-CM | POA: Diagnosis present

## 2021-09-16 DIAGNOSIS — H409 Unspecified glaucoma: Secondary | ICD-10-CM | POA: Diagnosis present

## 2021-09-16 DIAGNOSIS — E875 Hyperkalemia: Secondary | ICD-10-CM

## 2021-09-16 DIAGNOSIS — G8929 Other chronic pain: Secondary | ICD-10-CM | POA: Diagnosis present

## 2021-09-16 DIAGNOSIS — E1165 Type 2 diabetes mellitus with hyperglycemia: Secondary | ICD-10-CM | POA: Diagnosis present

## 2021-09-16 DIAGNOSIS — J449 Chronic obstructive pulmonary disease, unspecified: Secondary | ICD-10-CM | POA: Diagnosis present

## 2021-09-16 DIAGNOSIS — F39 Unspecified mood [affective] disorder: Secondary | ICD-10-CM | POA: Diagnosis present

## 2021-09-16 DIAGNOSIS — Y92009 Unspecified place in unspecified non-institutional (private) residence as the place of occurrence of the external cause: Secondary | ICD-10-CM | POA: Diagnosis not present

## 2021-09-16 DIAGNOSIS — N3000 Acute cystitis without hematuria: Secondary | ICD-10-CM | POA: Diagnosis not present

## 2021-09-16 DIAGNOSIS — N1832 Chronic kidney disease, stage 3b: Secondary | ICD-10-CM | POA: Diagnosis present

## 2021-09-16 DIAGNOSIS — Z87891 Personal history of nicotine dependence: Secondary | ICD-10-CM | POA: Diagnosis not present

## 2021-09-16 DIAGNOSIS — F419 Anxiety disorder, unspecified: Secondary | ICD-10-CM | POA: Diagnosis present

## 2021-09-16 DIAGNOSIS — E1122 Type 2 diabetes mellitus with diabetic chronic kidney disease: Secondary | ICD-10-CM | POA: Diagnosis present

## 2021-09-16 DIAGNOSIS — I129 Hypertensive chronic kidney disease with stage 1 through stage 4 chronic kidney disease, or unspecified chronic kidney disease: Secondary | ICD-10-CM | POA: Diagnosis present

## 2021-09-16 DIAGNOSIS — E039 Hypothyroidism, unspecified: Secondary | ICD-10-CM

## 2021-09-16 DIAGNOSIS — K219 Gastro-esophageal reflux disease without esophagitis: Secondary | ICD-10-CM | POA: Diagnosis present

## 2021-09-16 DIAGNOSIS — N179 Acute kidney failure, unspecified: Principal | ICD-10-CM | POA: Diagnosis present

## 2021-09-16 DIAGNOSIS — R4781 Slurred speech: Secondary | ICD-10-CM | POA: Diagnosis present

## 2021-09-16 DIAGNOSIS — W1839XA Other fall on same level, initial encounter: Secondary | ICD-10-CM | POA: Diagnosis present

## 2021-09-16 DIAGNOSIS — R9431 Abnormal electrocardiogram [ECG] [EKG]: Secondary | ICD-10-CM

## 2021-09-16 DIAGNOSIS — D631 Anemia in chronic kidney disease: Secondary | ICD-10-CM | POA: Diagnosis present

## 2021-09-16 DIAGNOSIS — M549 Dorsalgia, unspecified: Secondary | ICD-10-CM | POA: Diagnosis present

## 2021-09-16 DIAGNOSIS — I509 Heart failure, unspecified: Secondary | ICD-10-CM | POA: Insufficient documentation

## 2021-09-16 DIAGNOSIS — E785 Hyperlipidemia, unspecified: Secondary | ICD-10-CM | POA: Diagnosis present

## 2021-09-16 DIAGNOSIS — M797 Fibromyalgia: Secondary | ICD-10-CM | POA: Diagnosis present

## 2021-09-16 DIAGNOSIS — N390 Urinary tract infection, site not specified: Secondary | ICD-10-CM | POA: Diagnosis present

## 2021-09-16 LAB — HEMOGLOBIN A1C
Hgb A1c MFr Bld: 5.6 % (ref 4.8–5.6)
Mean Plasma Glucose: 114.02 mg/dL

## 2021-09-16 LAB — COMPREHENSIVE METABOLIC PANEL
ALT: 24 U/L (ref 0–44)
AST: 20 U/L (ref 15–41)
Albumin: 3.9 g/dL (ref 3.5–5.0)
Alkaline Phosphatase: 68 U/L (ref 38–126)
Anion gap: 8 (ref 5–15)
BUN: 74 mg/dL — ABNORMAL HIGH (ref 8–23)
CO2: 24 mmol/L (ref 22–32)
Calcium: 8.7 mg/dL — ABNORMAL LOW (ref 8.9–10.3)
Chloride: 109 mmol/L (ref 98–111)
Creatinine, Ser: 2.3 mg/dL — ABNORMAL HIGH (ref 0.44–1.00)
GFR, Estimated: 22 mL/min — ABNORMAL LOW (ref >60)
Glucose, Bld: 133 mg/dL — ABNORMAL HIGH (ref 70–99)
Potassium: 5.6 mmol/L — ABNORMAL HIGH (ref 3.5–5.1)
Sodium: 141 mmol/L (ref 135–145)
Total Bilirubin: 0.5 mg/dL (ref 0.3–1.2)
Total Protein: 7.7 g/dL (ref 6.5–8.1)

## 2021-09-16 LAB — BASIC METABOLIC PANEL
Anion gap: 6 (ref 5–15)
BUN: 76 mg/dL — ABNORMAL HIGH (ref 8–23)
CO2: 23 mmol/L (ref 22–32)
Calcium: 8.1 mg/dL — ABNORMAL LOW (ref 8.9–10.3)
Chloride: 110 mmol/L (ref 98–111)
Creatinine, Ser: 2.94 mg/dL — ABNORMAL HIGH (ref 0.44–1.00)
GFR, Estimated: 17 mL/min — ABNORMAL LOW (ref >60)
Glucose, Bld: 106 mg/dL — ABNORMAL HIGH (ref 70–99)
Potassium: 5.1 mmol/L (ref 3.5–5.1)
Sodium: 139 mmol/L (ref 135–145)

## 2021-09-16 LAB — CBC WITH DIFFERENTIAL/PLATELET
Abs Immature Granulocytes: 0.08 10*3/uL — ABNORMAL HIGH (ref 0.00–0.07)
Basophils Absolute: 0 10*3/uL (ref 0.0–0.1)
Basophils Relative: 0 %
Eosinophils Absolute: 0.2 10*3/uL (ref 0.0–0.5)
Eosinophils Relative: 3 %
HCT: 35.9 % — ABNORMAL LOW (ref 36.0–46.0)
Hemoglobin: 10.4 g/dL — ABNORMAL LOW (ref 12.0–15.0)
Immature Granulocytes: 1 %
Lymphocytes Relative: 27 %
Lymphs Abs: 2 10*3/uL (ref 0.7–4.0)
MCH: 23.1 pg — ABNORMAL LOW (ref 26.0–34.0)
MCHC: 29 g/dL — ABNORMAL LOW (ref 30.0–36.0)
MCV: 79.8 fL — ABNORMAL LOW (ref 80.0–100.0)
Monocytes Absolute: 0.8 10*3/uL (ref 0.1–1.0)
Monocytes Relative: 10 %
Neutro Abs: 4.3 10*3/uL (ref 1.7–7.7)
Neutrophils Relative %: 59 %
Platelets: 212 10*3/uL (ref 150–400)
RBC: 4.5 MIL/uL (ref 3.87–5.11)
RDW: 15.9 % — ABNORMAL HIGH (ref 11.5–15.5)
WBC: 7.3 10*3/uL (ref 4.0–10.5)
nRBC: 0 % (ref 0.0–0.2)

## 2021-09-16 LAB — GLUCOSE, CAPILLARY
Glucose-Capillary: 159 mg/dL — ABNORMAL HIGH (ref 70–99)
Glucose-Capillary: 128 mg/dL — ABNORMAL HIGH (ref 70–99)
Glucose-Capillary: 190 mg/dL — ABNORMAL HIGH (ref 70–99)
Glucose-Capillary: 101 mg/dL — ABNORMAL HIGH (ref 70–99)

## 2021-09-16 LAB — TSH: TSH: 0.71 u[IU]/mL (ref 0.350–4.500)

## 2021-09-16 LAB — TROPONIN I (HIGH SENSITIVITY): Troponin I (High Sensitivity): 5 ng/L (ref <18)

## 2021-09-16 LAB — MRSA NEXT GEN BY PCR, NASAL: MRSA by PCR Next Gen: NOT DETECTED

## 2021-09-16 LAB — MAGNESIUM: Magnesium: 2.8 mg/dL — ABNORMAL HIGH (ref 1.7–2.4)

## 2021-09-16 MED ORDER — HYDROXYZINE HCL 10 MG PO TABS
10.0000 mg | ORAL_TABLET | Freq: Three times a day (TID) | ORAL | Status: DC | PRN
Start: 2021-09-16 — End: 2021-09-17

## 2021-09-16 MED ORDER — FAMOTIDINE 20 MG PO TABS
40.0000 mg | ORAL_TABLET | Freq: Two times a day (BID) | ORAL | Status: DC
Start: 2021-09-16 — End: 2021-09-16
  Administered 2021-09-16: 40 mg via ORAL
  Filled 2021-09-16: qty 2

## 2021-09-16 MED ORDER — ACETAMINOPHEN 325 MG PO TABS
650.0000 mg | ORAL_TABLET | Freq: Four times a day (QID) | ORAL | Status: DC | PRN
  Administered 2021-09-17: 650 mg via ORAL
  Filled 2021-09-16: qty 2

## 2021-09-16 MED ORDER — BUPROPION HCL ER (XL) 150 MG PO TB24
150.0000 mg | ORAL_TABLET | ORAL | Status: DC
  Administered 2021-09-16 – 2021-09-17 (×2): 150 mg via ORAL
  Filled 2021-09-16 (×3): qty 1

## 2021-09-16 MED ORDER — AMLODIPINE BESYLATE 5 MG PO TABS
10.0000 mg | ORAL_TABLET | Freq: Every day | ORAL | Status: DC
  Administered 2021-09-16 – 2021-09-17 (×2): 10 mg via ORAL
  Filled 2021-09-16 (×2): qty 2

## 2021-09-16 MED ORDER — INSULIN ASPART 100 UNIT/ML IJ SOLN
0.0000 [IU] | Freq: Three times a day (TID) | INTRAMUSCULAR | Status: DC
  Administered 2021-09-16: 2 [IU] via SUBCUTANEOUS
  Administered 2021-09-16 (×2): 3 [IU] via SUBCUTANEOUS
  Administered 2021-09-17: 2 [IU] via SUBCUTANEOUS

## 2021-09-16 MED ORDER — INSULIN ASPART 100 UNIT/ML IJ SOLN
2.0000 [IU] | Freq: Three times a day (TID) | INTRAMUSCULAR | Status: DC
  Administered 2021-09-16 – 2021-09-17 (×3): 2 [IU] via SUBCUTANEOUS

## 2021-09-16 MED ORDER — SODIUM CHLORIDE 0.9 % IV SOLN: INTRAVENOUS | Status: DC

## 2021-09-16 MED ORDER — OXYCODONE HCL 5 MG PO TABS: 5.0000 mg | ORAL_TABLET | ORAL | Status: DC | PRN

## 2021-09-16 MED ORDER — CIPROFLOXACIN IN D5W 400 MG/200ML IV SOLN
400.0000 mg | Freq: Two times a day (BID) | INTRAVENOUS | Status: DC
  Administered 2021-09-16: 400 mg via INTRAVENOUS
  Filled 2021-09-16: qty 200

## 2021-09-16 MED ORDER — EZETIMIBE 10 MG PO TABS
10.0000 mg | ORAL_TABLET | Freq: Every day | ORAL | Status: DC
  Administered 2021-09-16 – 2021-09-17 (×2): 10 mg via ORAL
  Filled 2021-09-16 (×2): qty 1

## 2021-09-16 MED ORDER — MIRABEGRON ER 25 MG PO TB24
25.0000 mg | ORAL_TABLET | Freq: Every day | ORAL | Status: DC
  Administered 2021-09-16 – 2021-09-17 (×2): 25 mg via ORAL
  Filled 2021-09-16 (×2): qty 1

## 2021-09-16 MED ORDER — LORAZEPAM 0.5 MG PO TABS
0.5000 mg | ORAL_TABLET | Freq: Two times a day (BID) | ORAL | Status: DC
  Administered 2021-09-16 – 2021-09-17 (×3): 0.5 mg via ORAL
  Filled 2021-09-16 (×3): qty 1

## 2021-09-16 MED ORDER — ONDANSETRON HCL 4 MG/2ML IJ SOLN: 4.0000 mg | Freq: Four times a day (QID) | INTRAMUSCULAR | Status: DC | PRN

## 2021-09-16 MED ORDER — LEVOTHYROXINE SODIUM 50 MCG PO TABS
50.0000 ug | ORAL_TABLET | Freq: Every day | ORAL | Status: DC
  Administered 2021-09-16 – 2021-09-17 (×2): 50 ug via ORAL
  Filled 2021-09-16: qty 1
  Filled 2021-09-16: qty 2

## 2021-09-16 MED ORDER — LAMOTRIGINE 100 MG PO TABS
100.0000 mg | ORAL_TABLET | Freq: Two times a day (BID) | ORAL | Status: DC
  Administered 2021-09-16: 100 mg via ORAL
  Filled 2021-09-16: qty 1

## 2021-09-16 MED ORDER — INSULIN DETEMIR 100 UNIT/ML ~~LOC~~ SOLN
20.0000 [IU] | Freq: Every day | SUBCUTANEOUS | Status: DC
  Filled 2021-09-16: qty 0.2

## 2021-09-16 MED ORDER — RISPERIDONE 0.5 MG PO TABS
0.5000 mg | ORAL_TABLET | Freq: Every day | ORAL | Status: DC
  Administered 2021-09-16: 0.5 mg via ORAL
  Filled 2021-09-16: qty 1

## 2021-09-16 MED ORDER — INSULIN ASPART 100 UNIT/ML IJ SOLN: 0.0000 [IU] | Freq: Every day | INTRAMUSCULAR | Status: DC

## 2021-09-16 MED ORDER — METOPROLOL TARTRATE 50 MG PO TABS
50.0000 mg | ORAL_TABLET | Freq: Two times a day (BID) | ORAL | Status: DC
  Administered 2021-09-16 – 2021-09-17 (×3): 50 mg via ORAL
  Filled 2021-09-16 (×3): qty 1

## 2021-09-16 MED ORDER — SERTRALINE HCL 50 MG PO TABS
100.0000 mg | ORAL_TABLET | ORAL | Status: DC
  Administered 2021-09-16 – 2021-09-17 (×2): 100 mg via ORAL
  Filled 2021-09-16 (×3): qty 2

## 2021-09-16 MED ORDER — ONDANSETRON HCL 4 MG PO TABS: 4.0000 mg | ORAL_TABLET | Freq: Four times a day (QID) | ORAL | Status: DC | PRN

## 2021-09-16 MED ORDER — ASPIRIN 81 MG PO TBEC
81.0000 mg | DELAYED_RELEASE_TABLET | Freq: Every day | ORAL | Status: DC
  Administered 2021-09-16 – 2021-09-17 (×2): 81 mg via ORAL
  Filled 2021-09-16 (×2): qty 1

## 2021-09-16 MED ORDER — CIPROFLOXACIN IN D5W 400 MG/200ML IV SOLN
400.0000 mg | INTRAVENOUS | Status: DC
  Administered 2021-09-17: 400 mg via INTRAVENOUS
  Filled 2021-09-16: qty 200

## 2021-09-16 MED ORDER — INSULIN DETEMIR 100 UNIT/ML ~~LOC~~ SOLN
15.0000 [IU] | Freq: Every day | SUBCUTANEOUS | Status: DC
  Administered 2021-09-17: 15 [IU] via SUBCUTANEOUS
  Filled 2021-09-16 (×2): qty 0.15

## 2021-09-16 MED ORDER — LAMOTRIGINE 25 MG PO TABS
75.0000 mg | ORAL_TABLET | Freq: Two times a day (BID) | ORAL | Status: DC
  Administered 2021-09-16 – 2021-09-17 (×2): 75 mg via ORAL
  Filled 2021-09-16 (×2): qty 3

## 2021-09-16 MED ORDER — SODIUM ZIRCONIUM CYCLOSILICATE 10 G PO PACK
10.0000 g | PACK | Freq: Three times a day (TID) | ORAL | Status: AC
  Administered 2021-09-16 (×3): 10 g via ORAL
  Filled 2021-09-16 (×3): qty 1

## 2021-09-16 MED ORDER — ACETAMINOPHEN 650 MG RE SUPP: 650.0000 mg | Freq: Four times a day (QID) | RECTAL | Status: DC | PRN

## 2021-09-16 MED ORDER — TRAZODONE HCL 50 MG PO TABS
150.0000 mg | ORAL_TABLET | Freq: Every day | ORAL | Status: DC
  Administered 2021-09-16: 150 mg via ORAL
  Filled 2021-09-16: qty 3

## 2021-09-16 MED ORDER — HEPARIN SODIUM (PORCINE) 5000 UNIT/ML IJ SOLN
5000.0000 [IU] | Freq: Three times a day (TID) | INTRAMUSCULAR | Status: DC
  Administered 2021-09-16 – 2021-09-17 (×4): 5000 [IU] via SUBCUTANEOUS
  Filled 2021-09-16 (×4): qty 1

## 2021-09-16 MED ORDER — MORPHINE SULFATE (PF) 2 MG/ML IV SOLN: 2.0000 mg | INTRAVENOUS | Status: DC | PRN

## 2021-09-16 MED ORDER — FAMOTIDINE 20 MG PO TABS
10.0000 mg | ORAL_TABLET | Freq: Two times a day (BID) | ORAL | Status: DC
  Administered 2021-09-16 – 2021-09-17 (×2): 10 mg via ORAL
  Filled 2021-09-16 (×2): qty 1

## 2021-09-16 MED ORDER — MONTELUKAST SODIUM 10 MG PO TABS
10.0000 mg | ORAL_TABLET | Freq: Every day | ORAL | Status: DC
  Administered 2021-09-16 – 2021-09-17 (×2): 10 mg via ORAL
  Filled 2021-09-16 (×2): qty 1

## 2021-09-16 MED ORDER — PREGABALIN 75 MG PO CAPS
75.0000 mg | ORAL_CAPSULE | Freq: Every day | ORAL | Status: DC
  Administered 2021-09-16 – 2021-09-17 (×2): 75 mg via ORAL
  Filled 2021-09-16 (×2): qty 1

## 2021-09-16 NOTE — Evaluation (Signed)
Physical Therapy Evaluation Patient Details Name: [name] MRN: [id number] DOB: 28-Oct-1950 Today's Date: 09/16/2021  History of Present Illness  [name] is a 71 y.o. female with medical history significant of allergies, anemia due to chronic disease, anxiety, mood disorder, chronic constipation, COPD, diabetes mellitus type 2 insulin-dependent, GERD, hyperlipidemia, hypertension, hypothyroidism, SBO, stroke, CKD stage IIIb, and more presents to ED with a chief complaint of generalized weakness.  Patient reports that she had a fall at home because her legs gave out.  She denies any asymmetric weakness.  Prior to her fall she had no chest pain, palpitations, dyspnea, dizziness.  She thinks she might of hit her head.  She did not have any syncope.  She has not had any change in vision aside from her normal chronic blurry vision.  She has not had any change in hearing, or headache.  She reports her speech has been slurred for a very long time.  She did have 1 episode of loose stool today that was brown.  She denies any hematochezia, melena.  Patient denies any dysuria, hematuria, but does report urinary frequency.  She has attributed that to drinking more water.  Patient denies muscle cramping.  She reports she just saw her kidney doctor and was told that her creatinine was better, so she is surprised to hear that she has an AKI now.  Patient has no other complaints at this time.   Clinical Impression  Patient demonstrates labored movement for sitting up at bedside requiring HOB raised, unsteady  on feet with frequent episodes of near loss of balance once fatigued, after sitting on commode to urinate patient unable to walk to doorway due to fatigue and fall risk.  Patient tolerated sitting up in chair after therapy - RN notified.  Patient will benefit from continued skilled physical therapy in hospital and recommended venue below to increase strength, balance, endurance for safe ADLs and gait.          Recommendations for follow up therapy are one component of a multi-disciplinary discharge planning process, led by the attending physician.  Recommendations may be updated based on patient status, additional functional criteria and insurance authorization.  Follow Up Recommendations Skilled nursing-short term rehab (<3 hours/day) Can patient physically be transported by private vehicle: Yes    Assistance Recommended at Discharge Set up Supervision/Assistance  Patient can return home with the following  A lot of help with walking and/or transfers;A little help with bathing/dressing/bathroom;Assistance with cooking/housework;Help with stairs or ramp for entrance    Equipment Recommendations None recommended by PT  Recommendations for Other Services       Functional Status Assessment Patient has had a recent decline in their functional status and demonstrates the ability to make significant improvements in function in a reasonable and predictable amount of time.     Precautions / Restrictions Precautions Precautions: Fall Restrictions Weight Bearing Restrictions: No      Mobility  Bed Mobility Overal bed mobility: Needs Assistance Bed Mobility: Supine to Sit     Supine to sit: Min guard, HOB elevated     General bed mobility comments: increased time, labored movement, required HOB raised    Transfers Overall transfer level: Needs assistance Equipment used: Rolling walker (2 wheels) Transfers: Sit to/from Stand, Bed to chair/wheelchair/BSC Sit to Stand: Min assist   Step pivot transfers: Min assist       General transfer comment: slow labored unsteady movement    Ambulation/Gait Ambulation/Gait assistance: Herbalist (  Feet): 15 Feet Assistive device: Rolling walker (2 wheels) Gait Pattern/deviations: Decreased step length - right, Decreased step length - left, Decreased stride length Gait velocity: decreased     General Gait Details: slow  labored unsteady cadence with difficulty advancing BLE due to weakness and limited mostly due to fatigue  Stairs            Wheelchair Mobility    Modified Rankin (Stroke Patients Only)       Balance Overall balance assessment: Needs assistance Sitting-balance support: Feet supported, No upper extremity supported Sitting balance-Leahy Scale: Fair Sitting balance - Comments: fair/good seated at EOB   Standing balance support: During functional activity, Bilateral upper extremity supported Standing balance-Leahy Scale: Poor Standing balance comment: fair/poor using RW                             Pertinent Vitals/Pain Pain Assessment Pain Assessment: 0-10 Pain Score: 6  Pain Location: chronic left shoulder, left side of neck and low back Pain Descriptors / Indicators: Sore, Discomfort Pain Intervention(s): Limited activity within patient's tolerance, Monitored during session, Repositioned    Home Living Family/patient expects to be discharged to:: Private residence Living Arrangements: Alone Available Help at Discharge: Personal care attendant;Available PRN/intermittently Type of Home: Apartment Home Access: Level entry       Home Layout: One level Home Equipment: Conservation officer, nature (2 wheels);Cane - single point      Prior Function Prior Level of Function : Needs assist       Physical Assist : Mobility (physical);ADLs (physical) Mobility (physical): Bed mobility;Transfers;Gait;Stairs ADLs (physical): IADLs Mobility Comments: household ambulator using Rollator most of time, sometimes single point cane, uses electric carts in grocery stores ADLs Comments: home aides 8 hours/day x 5 days/week     Hand Dominance   Dominant Hand: Right    Extremity/Trunk Assessment   Upper Extremity Assessment Upper Extremity Assessment: Generalized weakness    Lower Extremity Assessment Lower Extremity Assessment: Generalized weakness    Cervical / Trunk  Assessment Cervical / Trunk Assessment: Kyphotic  Communication   Communication: No difficulties  Cognition Arousal/Alertness: Awake/alert Behavior During Therapy: WFL for tasks assessed/performed Overall Cognitive Status: Within Functional Limits for tasks assessed                                          General Comments      Exercises     Assessment/Plan    PT Assessment Patient needs continued PT services  PT Problem List Decreased strength;Decreased activity tolerance;Decreased balance;Decreased mobility       PT Treatment Interventions DME instruction;Gait training;Stair training;Functional mobility training;Therapeutic activities;Therapeutic exercise;Balance training;Patient/family education    PT Goals (Current goals can be found in the Care Plan section)  Acute Rehab PT Goals Patient Stated Goal: return home with home aides to assist PT Goal Formulation: With patient Time For Goal Achievement: 09/30/21 Potential to Achieve Goals: Good    Frequency Min 3X/week     Co-evaluation               AM-PAC PT "6 Clicks" Mobility  Outcome Measure Help needed turning from your back to your side while in a flat bed without using bedrails?: None Help needed moving from lying on your back to sitting on the side of a flat bed without using bedrails?: A Little Help needed moving  to and from a bed to a chair (including a wheelchair)?: A Little Help needed standing up from a chair using your arms (e.g., wheelchair or bedside chair)?: A Little Help needed to walk in hospital room?: A Lot Help needed climbing 3-5 steps with a railing? : A Lot 6 Click Score: 17    End of Session   Activity Tolerance: Patient tolerated treatment well;Patient limited by fatigue Patient left: in chair;with call bell/phone within reach Nurse Communication: Mobility status PT Visit Diagnosis: Unsteadiness on feet (R26.81);Other abnormalities of gait and mobility  (R26.89);Muscle weakness (generalized) (M62.81)    Time: 6269-4854 PT Time Calculation (min) (ACUTE ONLY): 30 min   Charges:   PT Evaluation $PT Eval Moderate Complexity: 1 Mod PT Treatments $Therapeutic Activity: 23-37 mins        11:56 AM, 09/16/21 Lonell Grandchild, MPT Physical Therapist with [facility] [phone] office [phone] mobile phone

## 2021-09-16 NOTE — Assessment & Plan Note (Signed)
-   Continue Synthroid - Continue to monitor 

## 2021-09-16 NOTE — Assessment & Plan Note (Signed)
-   Generalized weakness resulting in fall -Secondary to electrolyte abnormalities -See plan for hyperkalemia

## 2021-09-16 NOTE — Assessment & Plan Note (Signed)
-   Most likely secondary to potassium derangement -Troponin pending -Monitor on telemetry -Chest pain-free

## 2021-09-16 NOTE — TOC Initial Note (Signed)
Transition of Care [facility]) - Initial/Assessment Note    Patient Details  Name: [name] MRN: [id number] Date of Birth: 1951-02-05  Transition of [facility]) CM/SW Contact:    Iona Beard, [place] Phone Number: 09/16/2021, 10:08 AM  Clinical Narrative:                 Pt is high risk for readmission. CSW spoke with pt in room about PT recommendation of SNF. Pt states that she does not want to go to SNF and would like to return home. Pt states that she has been to two SNFs in the past and does not want to return to one. Pt has an aide in the home M-F for 8 hours a day. Pt states that she always uses [facility] and would like to continue using them. CSW reached out to [facility] rep, awaiting response.   CSW spoke with pts niece about if there is family that can check in on pt at times there is not an aide in the home. Pts niece states that they will do what is needed. She is appreciative of [facility] referral and states this will be very helpful for pt. TOC to follow.   Expected Discharge Plan: [place] Barriers to Discharge: Continued Medical Work up   Patient Goals and CMS Choice Patient states their goals for this hospitalization and ongoing recovery are:: return home with [facility] CMS Medicare.gov Compare Post Acute Care list provided to:: Patient Choice offered to / list presented to : Patient  Expected Discharge Plan and Services Expected Discharge Plan: [place] In-house Referral: Clinical Social Work Discharge Planning Services: CM Consult Post Acute Care Choice: [place] arrangements for the past 2 months: Apartment                                      Prior Living Arrangements/Services Living arrangements for the past 2 months: Apartment Lives with:: Self Patient language and need for interpreter reviewed:: Yes Do you feel safe going back to the place where you live?: Yes      Need for Family Participation in Patient Care: Yes  (Comment) Care giver support system in place?: Yes (comment) Current home services: Other (comment) (Aide) Criminal Activity/Legal Involvement Pertinent to Current Situation/Hospitalization: No - Comment as needed  Activities of Daily Living Home Assistive Devices/Equipment: Walker (specify type) ADL Screening (condition at time of admission) Patient's cognitive ability adequate to safely complete daily activities?: Yes Is the patient deaf or have difficulty hearing?: No Does the patient have difficulty seeing, even when wearing glasses/contacts?: No Does the patient have difficulty concentrating, remembering, or making decisions?: No Patient able to express need for assistance with ADLs?: Yes Does the patient have difficulty dressing or bathing?: Yes Independently performs ADLs?: No Communication: Needs assistance Is this a change from baseline?: Pre-admission baseline Dressing (OT): Needs assistance Is this a change from baseline?: Pre-admission baseline Grooming: Needs assistance Is this a change from baseline?: Pre-admission baseline Feeding: Needs assistance Is this a change from baseline?: Pre-admission baseline Bathing: Needs assistance Is this a change from baseline?: Pre-admission baseline Toileting: Needs assistance Is this a change from baseline?: Pre-admission baseline In/Out Bed: Needs assistance Is this a change from baseline?: Pre-admission baseline Walks in Home: Needs assistance Is this a change from baseline?: Pre-admission baseline Does the patient have difficulty walking or climbing stairs?: No  Weakness of Legs: Both Weakness of Arms/Hands: None  Permission Sought/Granted                  Emotional Assessment Appearance:: Appears stated age Attitude/Demeanor/Rapport: Engaged Affect (typically observed): Accepting Orientation: : Oriented to Self, Oriented to Place, Oriented to  Time, Oriented to Situation Alcohol / Substance Use: Not  Applicable Psych Involvement: No (comment)  Admission diagnosis:  Hyperkalemia [E87.5] Weakness [R53.1] AKI (acute kidney injury) ([place]) [N17.9] Injury of head, initial encounter [S09.90XA] Fall, initial encounter [W19.XXXA] Patient Active Problem List   Diagnosis Date Noted   AKI (acute kidney injury) ([place]) 09/16/2021   CHF (congestive heart failure) ([place]) 09/16/2021   Abnormal EKG 09/16/2021   Urinary frequency 09/01/2021   Anemia in chronic kidney disease (CKD) 08/10/2021   Generalized pain 08/04/2021   Sciatica, right side 07/19/2021   Breast pain, left 07/10/2021   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[place]) 07/10/2021   Atherosclerosis of aorta (HCC) 07/10/2021   Generalized weakness 05/12/2021   Advanced care planning/counseling discussion 12/23/2020   Head trauma, initial encounter 12/20/2020   Trigger finger, right 12/12/2020   Confusion 11/24/2020   Blurry vision 11/18/2020   Cervical radiculopathy 07/09/2020   Closed fracture of lateral malleolus of right fibula 02/27/2020   Headache 02/19/2020   Tubular adenoma of colon 02/09/2019   Benign neoplasm of cerebral meninges (HCC) 11/12/2018   Benign meningioma of brain ([place]) 10/31/2018   Osteoarthritis of both hips 07/06/2018   Lipoma of extremity 12/31/2017   Impingement syndrome of left shoulder region 12/27/2017   Leg weakness, bilateral 10/28/2017   Lumbar spondylosis with myelopathy 10/12/2017   Numbness of hand 10/10/2017   Chronic neck pain 07/25/2017   Chronic pain syndrome 07/25/2017   At risk for cardiovascular event 06/10/2017   Diabetic polyneuropathy associated with type 2 diabetes mellitus ([place]) 05/19/2016   Tobacco user 04/24/2016   Obesity (BMI 30.0-34.9) 04/24/2016   History of palpitations 08/09/2015   Labile hypertension 08/03/2015   Normal coronary arteries 08/03/2015   Dizziness 07/15/2015   Left-sided low back pain with left-sided  sciatica 06/27/2015   Multinodular goiter 05/06/2015   Rectocele, female 04/27/2015   Anal sphincter incontinence 04/27/2015   Pelvic relaxation due to rectocele 03/30/2015   Hyperkalemia 02/22/2015   Pulmonary hypertension ([place]) 02/22/2015   Posterior chest pain 02/21/2015   Episodic cigarette smoking dependence 01/11/2015   Migraine without aura and without status migrainosus, not intractable 07/02/2014   Flatulence 02/18/2014   Microcytic anemia 02/18/2014   COPD mixed type ([place]) 09/16/2013   Hypothyroidism 08/16/2013   Gastroparesis 04/28/2013   Nicotine dependence 03/09/2013   Seizure disorder ([place]) 01/19/2013   Displacement of cervical intervertebral disc without myelopathy 12/13/2012   Bursitis of shoulder 12/13/2012   UTI (urinary tract infection) 12/01/2012   Cervical disc disorder with radiculopathy of cervical region 10/31/2012   Solitary pulmonary nodule 08/19/2012   Anemia 07/05/2012   Hypersomnia disorder related to a known organic factor 06/11/2012   Pruritus 04/18/2012   Meningioma ([place]) 11/19/2011   Mononeuritis leg 10/25/2011   Carpal tunnel syndrome of right wrist 05/23/2011   Chronic pain of right hand 05/04/2011   Polypharmacy 04/28/2011   Mood disorder ([place]) 04/28/2011   Constipation 04/13/2011   Recurrent falls 12/12/2010   Oropharyngeal dysphagia 07/12/2010   Urinary incontinence 12/16/2009   HEARING LOSS 10/26/2009   Well controlled type 2 diabetes mellitus with gastroparesis ([place]) 07/07/2009   Hyperlipidemia 12/11/2008   IBS  12/11/2008   GERD 07/29/2008   MILK PRODUCTS ALLERGY 07/29/2008   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/03/2007   Essential hypertension 06/27/2007   Backache 06/19/2007   Osteoporosis 06/19/2007   Obstructive sleep apnea 06/19/2007   TRIGGER FINGER 04/18/2007   DIVERTICULOSIS, COLON 11/13/2006   PCP:  [name], MD Pharmacy:   [facility] - [place], [address] [address] Phone: [phone] Fax: [phone] - [place], [place] - [address] [place] [address] Phone: [phone] Fax: [phone]     Social Determinants of Health (SDOH) Interventions    Readmission Risk Interventions    09/16/2021   10:07 AM  Readmission Risk Prevention Plan  Transportation Screening Complete  Medication Review (RN Care Manager) Complete  HRI or [place] Complete  SW Recovery Care/Counseling Consult Complete  Palliative Care Screening Not [place] Patient Refused

## 2021-09-16 NOTE — H&P (Signed)
History and Physical    Patient: [name] GBT:[id number] DOB: 01/19/1951 DOA: 09/15/2021 DOS: the patient was seen and examined on 09/16/2021 PCP: [name], MD  Patient coming from: Home  Chief Complaint:  Chief Complaint  Patient presents with   Fall   HPI: [name] is a 71 y.o. female with medical history significant of allergies, anemia due to chronic disease, anxiety, mood disorder, chronic constipation, COPD, diabetes mellitus type 2 insulin-dependent, GERD, hyperlipidemia, hypertension, hypothyroidism, SBO, stroke, CKD stage IIIb, and more presents to ED with a chief complaint of generalized weakness.  Patient reports that she had a fall at home because her legs gave out.  She denies any asymmetric weakness.  Prior to her fall she had no chest pain, palpitations, dyspnea, dizziness.  She thinks she might of hit her head.  She did not have any syncope.  She has not had any change in vision aside from her normal chronic blurry vision.  She has not had any change in hearing, or headache.  She reports her speech has been slurred for a very long time.  She did have 1 episode of loose stool today that was brown.  She denies any hematochezia, melena.  Patient denies any dysuria, hematuria, but does report urinary frequency.  She has attributed that to drinking more water.  Patient denies muscle cramping.  She reports she just saw her kidney doctor and was told that her creatinine was better, so she is surprised to hear that she has an AKI now.  Patient has no other complaints at this time.  Patient quit smoking 1 month ago.  She does not drink alcohol, does not use illicit, and is vaccinated for COVID.  Patient is full code. Review of Systems: As mentioned in the history of present illness. All other systems reviewed and are negative. Past Medical History:  Diagnosis Date   Allergy    Anemia    Anemia in chronic kidney disease (CKD) 08/10/2021   Anxiety    takes Ativan daily    Arthritis    Assistance needed for mobility    Bipolar disorder ([place])    takes Risperdal nightly   Blood transfusion    Brain tumor ([place])    Cancer ([place])    In her gum   Carpal tunnel syndrome of right wrist 05/23/2011   Cervical disc disorder with radiculopathy of cervical region 10/31/2012   Chronic back pain    Chronic idiopathic constipation    Chronic neck and back pain    Colon polyps    COPD (chronic obstructive pulmonary disease) with chronic bronchitis ([place]) 09/16/2013   Office Spirometry 10/30/2013-submaximal effort based on appearance of loop and curve. Numbers would fit with severe restriction but her physiologic capability may be better than this. FVC 0.91/44%, and 10.74/45%, FEV1/FVC 0.81, FEF 25-75% 1.43/69%     Diabetes mellitus    Type II   Diverticulosis    TCS 9/08 by Dr. Delfin Edis for diarrhea . Bx for micro scopic colitis negative.    Fibromyalgia    Frequent falls    GERD (gastroesophageal reflux disease)    takes Aciphex daily   Glaucoma    eye drops daily   Gum symptoms    infection on antibiotic   Hiatal hernia    Hyperlipidemia    takes Crestor daily   Hypertension    takes Amlodipine,Metoprolol,and Clonidine daily   Hypothyroidism    takes Synthroid daily   IBS (irritable bowel syndrome)  Insomnia    takes Trazodone nightly   Major depression, recurrent ([place])    takes Zoloft daily   Malignant hyperpyrexia 16/94/5038   Metabolic encephalopathy 88/28/0034   Migraines    chronic headaches   Mononeuritis lower limb    Narcolepsy    Osteoporosis    Pancreatitis 2006   due to Depakote with normal EUS    Paralysis (HCC)    Schatzki's ring    non critical / EGD with ED 8/2011with RMR   Seizures ([place])    takes Lamictal daily.Last seizure 3 yrs ago   Sleep apnea    on CPAP   Small bowel obstruction (HCC)    Stroke ([place])    left sided weakness, speech changes   Tubular adenoma of colon    Past Surgical History:  Procedure Laterality  Date   ABDOMINAL HYSTERECTOMY  1978   BACK SURGERY  July 2012   BACTERIAL OVERGROWTH TEST N/A 05/05/2013   Procedure: BACTERIAL OVERGROWTH TEST;  Surgeon: Daneil Dolin, MD;  [facility];  Service: Endoscopy;  Laterality: N/A;  7:30   BIOPSY THYROID  2009   BRAIN SURGERY  11/2011   resection of meningioma   BREAST REDUCTION SURGERY  1994   CARDIAC CATHETERIZATION  05/10/2005   normal coronaries, normal LV systolic function and EF (Dr. Jackie Plum)   [place] Left 07/22/04   Dr. Aline Brochure   CATARACT EXTRACTION Bilateral    CHOLECYSTECTOMY  1984   COLONOSCOPY N/A 09/25/2012   JZP:[id number] diverticulosis.  colonic polyp-removed : tubular adenoma   CRANIOTOMY  11/23/2011   Procedure: CRANIOTOMY TUMOR EXCISION;  Surgeon: Hosie Spangle, MD;  Location: [facility];  Service: Neurosurgery;  Laterality: N/A;  Craniotomy for tumor resection   ESOPHAGOGASTRODUODENOSCOPY  12/29/2010   [place]-Retained food in the esophagus and stomach, small hiatal hernia, status post Maloney dilation of the esophagus   ESOPHAGOGASTRODUODENOSCOPY N/A 09/25/2012   VXY:[id number] atonic baggy esophagus status post Maloney dilation 45 F. Hiatal hernia   GIVENS CAPSULE STUDY N/A 01/15/2013   NORMAL.    IR GENERIC HISTORICAL  03/17/2016   IR RADIOLOGIST EVAL & MGMT 03/17/2016 [facility]   LESION REMOVAL N/A 05/31/2015   Procedure: REMOVAL RIGHT AND LEFT LESIONS OF MANDIBLE;  Surgeon: Diona Browner, DDS;  Location: [facility];  Service: Oral Surgery;  Laterality: N/A;   MALONEY DILATION  12/29/2010   RMR;   NM MYOCAR PERF WALL MOTION  2006   "relavtiely normal" persantine, mild anterior thinning (breast attenuation artifact), no region of scar/ischemia   OVARIAN CYST REMOVAL     RECTOCELE REPAIR N/A 06/29/2015   Procedure: POSTERIOR REPAIR (RECTOCELE);  Surgeon: Jonnie Kind, MD;  Location: [facility];  Service: Gynecology;  Laterality: N/A;   REDUCTION MAMMAPLASTY Bilateral    SPINE SURGERY  09/29/2010    Dr. Rolena Infante   surgical excision of 3 tumors from right thigh and right buttock  and left upper thigh  2010   TOOTH EXTRACTION Bilateral 12/14/2014   Procedure: REMOVAL OF BILATERAL MANDIBULAR EXOSTOSES;  Surgeon: Diona Browner, DDS;  Location: [facility];  Service: Oral Surgery;  Laterality: Bilateral;   TRANSTHORACIC ECHOCARDIOGRAM  2010   EF 60-65%, mild conc LVH, grade 1 diastolic dysfunction; mildly calcified MV annulus with mildly thickened leaflets, mildly calcified MR annulus   Social History:  reports that she quit smoking about 2 months ago. Her smoking use included cigarettes. She has a 7.50 pack-year smoking history. She has been exposed to tobacco  smoke. She has never used smokeless tobacco. She reports that she does not drink alcohol and does not use drugs.  Allergies  Allergen Reactions   Iron Nausea And Vomiting    And itching And itching   Lac Bovis Rash    Doesn't agree with stomach.  Doesn't agree with stomach.  Doesn't agree with stomach.    Penicillins Hives    Has patient had a PCN reaction causing immediate rash, facial/tongue/throat swelling, SOB or lightheadedness with hypotension: Yes Has patient had a PCN reaction causing severe rash involving mucus membranes or skin necrosis: No Has patient had a PCN reaction that required hospitalization No Has patient had a PCN reaction occurring within the last 10 years: No If all of the above answers are "NO", then may proceed with Cephalosporin use.  Other reaction(s): Other (see comments) Has patient had a PCN reaction causing immediate rash, facial/tongue/throat swelling, SOB or lightheadedness with hypotension: Yes Has patient had a PCN reaction causing severe rash involving mucus membranes or skin necrosis: No Has patient had a PCN reaction that required hospitalization No Has patient had a PCN reaction occurring within the last 10 years: No If all of the above answers are "NO", then may proceed with Cephalosporin use. Has  patient had a PCN reaction causing immediate rash, facial/tongue/throat swelling, SOB or lightheadedness with hypotension: Yes Has patient had a PCN reaction causing severe rash involving mucus membranes or skin necrosis: No Has patient had a PCN reaction that required hospitalization No Has patient had a PCN reaction occurring within the last 10 years: No If all of the above answers are "NO", then may proceed with Cephalosporin use. Has patient had a PCN reaction causing immediate rash, facial/tongue/throat swelling, SOB or lightheadedness with hypotension: Yes Has patient had a PCN reaction causing severe rash involving mucus membranes or skin necrosis: No Has patient had a PCN reaction that required hospitalization No Has patient had a PCN reaction occurring within the last 10 years: No If all of the above answers are "NO", then may proceed with Cephalosporin use. Has patient had a PCN reaction causing immediate rash, facial/tongue/throat swelling, SOB or lightheadedness with hypotension: Yes Has patient had a PCN reaction causing sever... (TRUNCATED)   Phenazopyridine Hives   Phenazopyridine Hcl Hives   Cephalexin Hives   Flonase [Fluticasone]     "It gave me ulcers in my nose"   Milk-Related Compounds Other (See Comments)    Doesn't agree with stomach.    Phenazopyridine Hcl Hives          Family History  Problem Relation Age of Onset   Heart attack Mother        HTN   Pneumonia Father    Kidney failure Father    Diabetes Father    Pancreatic cancer Sister    Cancer Sister        breast    Cancer Sister        pancreatic   Diabetes Brother    Hypertension Brother    Diabetes Brother    Alcohol abuse Maternal Uncle    Stroke Maternal Grandmother    Heart attack Maternal Grandfather    Hypertension Son    Sleep apnea Son    Colon cancer Neg Hx    Anesthesia problems Neg Hx    Hypotension Neg Hx    Malignant hyperthermia Neg Hx    Pseudochol deficiency Neg Hx     Breast cancer Neg Hx    Stomach cancer Neg Hx  Prior to Admission medications   Medication Sig Start Date End Date Taking? Authorizing Provider  ACCU-CHEK GUIDE test strip TEST BLOOD SUGAR FOUR TIMES DAILY  AND AS NEEDED 05/30/21   Philemon Kingdom, MD  Accu-Chek Softclix Lancets lancets TEST BLOOD SUGAR THREE TIMES DAILY AS DIRECTED 03/28/21   Philemon Kingdom, MD  albuterol (VENTOLIN HFA) 108 (90 Base) MCG/ACT inhaler INHALE 1 TO 2 PUFFS EVERY 6 HOURS AS NEEDED FOR WHEEZING, SHORTNESS OF BREATH 06/24/21   Baird Lyons D, MD  Alcohol Swabs (DROPSAFE ALCOHOL PREP) 70 % PADS USE TO CLEAN FINGER PRIOR TO TESTING FOR BLOOD SUGAR  AS DIRECTED 05/20/21   Philemon Kingdom, MD  alendronate (FOSAMAX) 70 MG tablet TAKE 1 TABLET (70 MG TOTAL) BY MOUTH EVERY 7 (SEVEN) DAYS. TAKE WITH A FULL GLASS OF WATER ON AN EMPTY STOMACH. 02/04/21   [name], MD  amLODipine (NORVASC) 10 MG tablet TAKE 1 TABLET EVERY DAY 06/06/21   Imogene Burn, PA-C  ascorbic acid (VITAMIN C) 500 MG tablet Take 500 mg by mouth daily.    [provider]  aspirin EC 81 MG tablet Take 1 tablet (81 mg total) by mouth daily with breakfast. 12/08/18   Roxan Hockey, MD  betamethasone dipropionate 0.05 % cream  02/17/20   [provider]  Blood Glucose Calibration (ACCU-CHEK GUIDE CONTROL) LIQD USE AS DIRECTED 12/13/20   Philemon Kingdom, MD  blood glucose meter kit and supplies Dispense based on patient and insurance preference. Use up to four times daily as directed. (FOR ICD-10 E10.9, E11.9). 06/27/19   Philemon Kingdom, MD  buPROPion (WELLBUTRIN XL) 150 MG 24 hr tablet Take 1 tablet (150 mg total) by mouth every morning. 07/12/21 07/12/22  Cloria Spring, MD  Calcium Carb-Cholecalciferol (CALTRATE 600+D3 SOFT) 600-20 MG-MCG CHEW Chew 1 tablet by mouth daily at 12 noon.    [provider]  cetirizine (ZYRTEC) 10 MG tablet Take 1 tablet (10 mg total) by mouth daily. 04/25/21   Renee Rival, FNP   Cholecalciferol 50 MCG (2000 UT) TABS Take 1 tablet by mouth daily.    [provider]  Clobetasol Prop Emollient Base (CLOBETASOL PROPIONATE E) 0.05 % emollient cream Apply to affected area qd 01/24/21   Lavonna Monarch, MD  Continuous Blood Gluc Sensor (FREESTYLE LIBRE 14 DAY SENSOR) MISC 1 each by Does not apply route every 14 (fourteen) days. Change every 2 weeks 06/24/19   Philemon Kingdom, MD  diclofenac Sodium (VOLTAREN) 1 % GEL APPLY 2 GRAMS TOPICALLY FOUR TIMES DAILY. 06/20/21   Esterwood, Amy S, PA-C  Docusate Sodium (DSS) 100 MG CAPS Take by mouth.    [provider]  DROPLET PEN NEEDLES 31G X 8 MM MISC USE FOR INJECTING INSULIN 4 TIMES DAILY. 09/02/21   Philemon Kingdom, MD  ezetimibe (ZETIA) 10 MG tablet TAKE 1 TABLET EVERY DAY 06/22/21   [name], MD  famotidine (PEPCID) 40 MG tablet Take 40 mg by mouth 2 (two) times daily. 08/30/20   [provider]  hydroxychloroquine (PLAQUENIL) 200 MG tablet Take by mouth.    [provider]  hydrOXYzine (ATARAX/VISTARIL) 10 MG tablet Take 1 tablet (10 mg total) by mouth 3 (three) times daily as needed. 08/03/20   [name], MD  insulin glargine, 2 Unit Dial, (TOUJEO MAX SOLOSTAR) 300 UNIT/ML Solostar Pen Inject 32 Units into the skin at bedtime. 06/28/21   Philemon Kingdom, MD  lamoTRIgine (LAMICTAL) 100 MG tablet Take 1 tablet (100 mg total) by  mouth 2 (two) times daily. 07/12/21   Cloria Spring, MD  levothyroxine (SYNTHROID) 50 MCG tablet TAKE 1 TABLET EVERY DAY FOR 6 DAYS PER WEEK, 1/2 TABLET 1 DAY PER WEEK 07/07/21   Philemon Kingdom, MD  LORazepam (ATIVAN) 0.5 MG tablet Take 1 tablet (0.5 mg total) by mouth 2 (two) times daily. 07/12/21   Cloria Spring, MD  meclizine (ANTIVERT) 25 MG tablet Take 1 tablet (25 mg total) by mouth 3 (three) times daily as needed for dizziness. 04/28/20   Noreene Larsson, NP  methocarbamol (ROBAXIN) 500 MG tablet Take one tablet by mouth two times daily , as  needed, for neck and back pain and spasm 11/10/20   [name], MD  methylPREDNISolone (MEDROL DOSEPAK) 4 MG TBPK tablet Take as prescribed 07/15/21   Kommor, Madison, MD  metoprolol tartrate (LOPRESSOR) 50 MG tablet TAKE 1 TABLET TWICE DAILY (NEED MD APPOINTMENT) 08/29/21   Ahmed Prima, Fransisco Hertz, PA-C  metroNIDAZOLE (FLAGYL) 250 MG tablet Take 1 tablet (250 mg total) by mouth 3 (three) times daily. 06/29/21   Pyrtle, Lajuan Lines, MD  mirabegron ER (MYRBETRIQ) 25 MG TB24 tablet Take 1 tablet (25 mg total) by mouth daily. 03/21/21   [name], MD  Misc. Devices (MATTRESS PAD) MISC FIRM MATTRESS PAD for hospital bed x 1 02/22/21   [name], MD  montelukast (SINGULAIR) 10 MG tablet TAKE 1 TABLET EVERY DAY 05/20/21   [name], MD  Multiple Vitamins-Minerals (MULTIVITAMIN GUMMIES ADULT PO) Take 1 tablet by mouth daily.    [provider]  Nicotine (NICOTROL NS) 10 MG/ML SOLN 1-5x/ hr x 8 weeks, then taper over 4-6 weeks to dc 06/02/21   Young, Clinton D, MD  NOVOLOG FLEXPEN 100 UNIT/ML FlexPen INJECT 16 TO 18 UNITS UNDER SKIN UP TO 3 TIMES A DAY BEFORE MEALS  (DISCARD PEN 28 DAYS AFTER OPENING) 09/02/21   Philemon Kingdom, MD  nystatin (MYCOSTATIN/NYSTOP) powder APPLY TO AFFECTED AREA 4 TIMES DAILY. 09/12/21   [name], MD  Omega-3 Fatty Acids (FISH OIL PO) Take 1 capsule by mouth daily.    [provider]  ondansetron (ZOFRAN) 4 MG tablet Take 1 tablet (4 mg total) by mouth every 8 (eight) hours as needed for nausea or vomiting. 07/19/21   Alvira Monday, FNP  oxyCODONE-acetaminophen (PERCOCET) 10-325 MG tablet Take 1 tablet by mouth every 8 (eight) hours as needed for pain. 11/01/20   [provider]  Plecanatide (TRULANCE) 3 MG TABS Take 3 mg by mouth daily as needed. 06/29/21   Pyrtle, Lajuan Lines, MD  pregabalin (LYRICA) 75 MG capsule Take 1 capsule (75 mg total) by mouth daily. 10/20/16   Rexene Alberts, MD  RABEprazole (ACIPHEX) 20 MG tablet Take 1  tablet (20 mg total) by mouth 2 (two) times daily. 06/29/21   Pyrtle, Lajuan Lines, MD  RESTASIS 0.05 % ophthalmic emulsion Place 2 drops into both eyes daily. 07/29/20   [provider]  risperiDONE (RISPERDAL) 0.5 MG tablet Take 1 tablet (0.5 mg total) by mouth at bedtime. 07/12/21   Cloria Spring, MD  sertraline (ZOLOFT) 100 MG tablet Take 1 tablet (100 mg total) by mouth every morning. 07/12/21   Cloria Spring, MD  spironolactone (ALDACTONE) 50 MG tablet Take 1 tablet (50 mg total) by mouth daily. 07/05/21   [name], MD  STIOLTO RESPIMAT 2.5-2.5 MCG/ACT AERS INHALE 2 PUFFS INTO THE LUNGS DAILY. Patient taking differently: Inhale 2 puffs into the  lungs daily. 05/31/20   Parrett, Fonnie Mu, NP  traZODone (DESYREL) 150 MG tablet Take 1 tablet (150 mg total) by mouth at bedtime. 07/12/21   Cloria Spring, MD  UNABLE TO [facility] bed mattress x 1  DX: D56.86, J44.9 10/16/19   [name], MD  UNABLE TO FIND Standard wheelchair Dx [phone], [phone] 03/23/20   [name], MD  UNABLE TO FIND Med Name: Activice cooling gel spray on arms, legs, and back    [provider]  UNABLE TO [facility] bed with orthopedic mattress x 1 DX G47.33, G89.4 04/01/21   [name], MD    Physical Exam: Vitals:   09/16/21 0400 09/16/21 0500 09/16/21 0534 09/16/21 0600  BP: (!) 114/49 (!) 111/53 (!) 167/54 (!) 173/51  Pulse: 73 72 75 80  Resp: (!) 21 19 19  (!) 22  Temp:   98.2 F (36.8 C)   TempSrc:   Oral   SpO2: 95% 97% 98% 98%  Weight:   74.9 kg   Height:   4' 11"  (1.499 m)    1.  General: Patient lying supine in bed,  no acute distress   2. Psychiatric: Alert and oriented x 3, mood and behavior normal for situation, pleasant and cooperative with exam   3. Neurologic: Speech chronically slurred and language is normal, face is symmetric, moves all 4 extremities voluntarily, at baseline without acute deficits on limited exam   4. HEENMT:  Head is atraumatic,  normocephalic, pupils reactive to light, neck is supple, trachea is midline, mucous membranes are moist   5. Respiratory : Lungs are clear to auscultation bilaterally without wheezing, rhonchi, rales, no cyanosis, no increase in work of breathing or accessory muscle use   6. Cardiovascular : Heart rate normal, rhythm is regular, no murmurs, rubs or gallops, no peripheral edema, peripheral pulses palpated   7. Gastrointestinal:  Abdomen is soft, nondistended, nontender to palpation bowel sounds active, no masses or organomegaly palpated   8. Skin:  Skin is warm, dry and intact without rashes, acute lesions, or ulcers on limited exam   9.Musculoskeletal:  No acute deformities or trauma, no asymmetry in tone, no peripheral edema, peripheral pulses palpated, no tenderness to palpation in the extremities  Data Reviewed: In the ED Temp 98.5, heart rate 60-80, respiratory 16-24, blood pressure 100/46-142/60, satting 95-100% No leukocytosis White blood cell count of 6.9, hemoglobin 10.1, platelets 231 Chemistry shows a BUN 80, creatinine 3.11, Leukos 245 Patient does have a hyperkalemia 6.2 with EKG changes UA is indicative of possible UTI, urine culture pending Due to fall CT C-spine was done that showed no acute findings CT head was done that showed generalized cerebral atrophy and chronic white matter small vessel ischemic changes.  Stable encephalomalacia and postsurgical area.  Stable small amount of meningioma Patient was given hyperkalemia cocktail including calcium gluconate, albuterol, Lasix, insulin/dextrose, bicarb, Lokelma Admission was requested for further management of AKI with hyperkalemia  Assessment and Plan: Mood disorder (HCC) - Continue Wellbutrin and hydroxyzine trazodone, Zoloft, Risperdal, Lamictal  Abnormal EKG - Most likely secondary to potassium derangement -Troponin pending -Monitor on telemetry -Chest pain-free  AKI (acute kidney injury) ([place]) Creatinine  increased from 1.2>> 3.1 -Now trending back down to 2.9 -Associated-anemia of 6.2 >> trending down to 5.1 -Patient had regularly scheduled visit with her [facility] [place] [facility] nephrologist.  At that visit they discussed that creatinine was 1.96 at previous visit and had improved to 1.16 at yesterday's visit.  No medication interventions at that time. -Unclear etiology of her AKI at this time.  Possibly poor p.o. intake in the setting of Lasix -Gentle IV hydration, holding Lasix -Hold nephrotoxic agents when possible -Trend in the a.m.  Generalized weakness - Generalized weakness resulting in fall -Secondary to electrolyte abnormalities -See plan for hyperkalemia  Diabetic polyneuropathy associated with type 2 diabetes mellitus ([facility]) - Continue reduced dose of basal insulin and sliding scale coverage -Last hemoglobin A1c is from 5.9 -Update hemoglobin A1c -Hyperglycemic in the ER 245 -Monitor CBGs  Hyperkalemia - Potassium 6.2 at arrival  -EKG changes present -After potassium cocktail potassium had gone down to 5.1 -Trend again with a.m. labs -Monitor on telemetry -Patient is still with good urine output -Continue IV fluids patient already received bolus -Continue to monitor  Hypothyroidism - Continue Synthroid -Continue to monitor  UTI (urinary tract infection) - UA is indicative of UTI -Urine culture pending -Continue Cipro due to medication allergies      Advance Care Planning:   Code Status: Full Code   Consults: Creatinine already trending down so nephrology consult was not ordered, if creatinine or potassium start trending back up patient may require nephrology consult  Family Communication: No family at bedside  Severity of Illness: The appropriate patient status for this patient is INPATIENT. Inpatient status is judged to be reasonable and necessary in order to provide the required intensity of service to ensure the patient's safety. The  patient's presenting symptoms, physical exam findings, and initial radiographic and laboratory data in the context of their chronic comorbidities is felt to place them at high risk for further clinical deterioration. Furthermore, it is not anticipated that the patient will be medically stable for discharge from the hospital within 2 midnights of admission.   * I certify that at the point of admission it is my clinical judgment that the patient will require inpatient hospital care spanning beyond 2 midnights from the point of admission due to high intensity of service, high risk for further deterioration and high frequency of surveillance required.*  Author: Rolla Plate, DO 09/16/2021 6:24 AM  For on call review [email or web address].

## 2021-09-16 NOTE — Progress Notes (Signed)
ASSUMPTION OF CARE NOTE   09/16/2021 10:57 AM  [name] was seen and examined.  The H&P by the admitting provider, orders, imaging was reviewed.  Please see new orders.  Will continue to follow.  Added labs, ordered treatment for hyperkalemia, ordered TOC evaluation.  Pt refusing SNF placement at this time but has care aide at home and family support.    Vitals:   09/16/21 0600 09/16/21 0752  BP: (!) 173/51   Pulse: 80   Resp: (!) 22   Temp:  97.9 F (36.6 C)  SpO2: 98%     Results for orders placed or performed during the hospital encounter of 09/15/21  CBC with Differential  Result Value Ref Range   WBC 6.9 4.0 - 10.5 K/uL   RBC 4.48 3.87 - 5.11 MIL/uL   Hemoglobin 10.1 (L) 12.0 - 15.0 g/dL   HCT 35.6 (L) 36.0 - 46.0 %   MCV 79.5 (L) 80.0 - 100.0 fL   MCH 22.5 (L) 26.0 - 34.0 pg   MCHC 28.4 (L) 30.0 - 36.0 g/dL   RDW 15.8 (H) 11.5 - 15.5 %   Platelets 231 150 - 400 K/uL   nRBC 0.0 0.0 - 0.2 %   Neutrophils Relative % 64 %   Neutro Abs 4.4 1.7 - 7.7 K/uL   Lymphocytes Relative 24 %   Lymphs Abs 1.6 0.7 - 4.0 K/uL   Monocytes Relative 9 %   Monocytes Absolute 0.6 0.1 - 1.0 K/uL   Eosinophils Relative 2 %   Eosinophils Absolute 0.1 0.0 - 0.5 K/uL   Basophils Relative 0 %   Basophils Absolute 0.0 0.0 - 0.1 K/uL   Immature Granulocytes 1 %   Abs Immature Granulocytes 0.05 0.00 - 0.07 K/uL  Basic metabolic panel  Result Value Ref Range   Sodium 136 135 - 145 mmol/L   Potassium 6.2 (H) 3.5 - 5.1 mmol/L   Chloride 108 98 - 111 mmol/L   CO2 23 22 - 32 mmol/L   Glucose, Bld 245 (H) 70 - 99 mg/dL   BUN 80 (H) 8 - 23 mg/dL   Creatinine, Ser 3.11 (H) 0.44 - 1.00 mg/dL   Calcium 8.4 (L) 8.9 - 10.3 mg/dL   GFR, Estimated 15 (L) >60 mL/min   Anion gap 5 5 - 15  Urinalysis, Routine w reflex microscopic Urine, Clean Catch  Result Value Ref Range   Color, Urine YELLOW YELLOW   APPearance CLEAR CLEAR   Specific Gravity, Urine 1.011 1.005 - 1.030   pH 5.0 5.0 - 8.0    Glucose, UA NEGATIVE NEGATIVE mg/dL   Hgb urine dipstick NEGATIVE NEGATIVE   Bilirubin Urine NEGATIVE NEGATIVE   Ketones, ur NEGATIVE NEGATIVE mg/dL   Protein, ur NEGATIVE NEGATIVE mg/dL   Nitrite NEGATIVE NEGATIVE   Leukocytes,Ua MODERATE (A) NEGATIVE   RBC / HPF 0-5 0 - 5 RBC/hpf   WBC, UA 6-10 0 - 5 WBC/hpf   Bacteria, UA RARE (A) NONE SEEN   Squamous Epithelial / LPF 0-5 0 - 5   Hyaline Casts, UA PRESENT   Basic metabolic panel  Result Value Ref Range   Sodium 139 135 - 145 mmol/L   Potassium 5.1 3.5 - 5.1 mmol/L   Chloride 110 98 - 111 mmol/L   CO2 23 22 - 32 mmol/L   Glucose, Bld 106 (H) 70 - 99 mg/dL   BUN 76 (H) 8 - 23 mg/dL   Creatinine, Ser 2.94 (H) 0.44 - 1.00 mg/dL  Calcium 8.1 (L) 8.9 - 10.3 mg/dL   GFR, Estimated 17 (L) >60 mL/min   Anion gap 6 5 - 15  Comprehensive metabolic panel  Result Value Ref Range   Sodium 141 135 - 145 mmol/L   Potassium 5.6 (H) 3.5 - 5.1 mmol/L   Chloride 109 98 - 111 mmol/L   CO2 24 22 - 32 mmol/L   Glucose, Bld 133 (H) 70 - 99 mg/dL   BUN 74 (H) 8 - 23 mg/dL   Creatinine, Ser 2.30 (H) 0.44 - 1.00 mg/dL   Calcium 8.7 (L) 8.9 - 10.3 mg/dL   Total Protein 7.7 6.5 - 8.1 g/dL   Albumin 3.9 3.5 - 5.0 g/dL   AST 20 15 - 41 U/L   ALT 24 0 - 44 U/L   Alkaline Phosphatase 68 38 - 126 U/L   Total Bilirubin 0.5 0.3 - 1.2 mg/dL   GFR, Estimated 22 (L) >60 mL/min   Anion gap 8 5 - 15  Magnesium  Result Value Ref Range   Magnesium 2.8 (H) 1.7 - 2.4 mg/dL  CBC with Differential/Platelet  Result Value Ref Range   WBC 7.3 4.0 - 10.5 K/uL   RBC 4.50 3.87 - 5.11 MIL/uL   Hemoglobin 10.4 (L) 12.0 - 15.0 g/dL   HCT 35.9 (L) 36.0 - 46.0 %   MCV 79.8 (L) 80.0 - 100.0 fL   MCH 23.1 (L) 26.0 - 34.0 pg   MCHC 29.0 (L) 30.0 - 36.0 g/dL   RDW 15.9 (H) 11.5 - 15.5 %   Platelets 212 150 - 400 K/uL   nRBC 0.0 0.0 - 0.2 %   Neutrophils Relative % 59 %   Neutro Abs 4.3 1.7 - 7.7 K/uL   Lymphocytes Relative 27 %   Lymphs Abs 2.0 0.7 - 4.0 K/uL    Monocytes Relative 10 %   Monocytes Absolute 0.8 0.1 - 1.0 K/uL   Eosinophils Relative 3 %   Eosinophils Absolute 0.2 0.0 - 0.5 K/uL   Basophils Relative 0 %   Basophils Absolute 0.0 0.0 - 0.1 K/uL   Immature Granulocytes 1 %   Abs Immature Granulocytes 0.08 (H) 0.00 - 0.07 K/uL  TSH  Result Value Ref Range   TSH 0.710 0.350 - 4.500 uIU/mL  Hemoglobin A1c  Result Value Ref Range   Hgb A1c MFr Bld 5.6 4.8 - 5.6 %   Mean Plasma Glucose 114.02 mg/dL  Glucose, capillary  Result Value Ref Range   Glucose-Capillary 159 (H) 70 - 99 mg/dL  POC CBG, ED  Result Value Ref Range   Glucose-Capillary 263 (H) 70 - 99 mg/dL  Troponin I (High Sensitivity)  Result Value Ref Range   Troponin I (High Sensitivity) 5 <18 ng/L   *Note: Due to a large number of results and/or encounters for the requested time period, some results have not been displayed. A complete set of results can be found in Results Review.   Time spent: 35 mins   Murvin Natal, MD [facility]   09/15/2021  5:30 PM How to contact the [facility] Attending or Consulting provider [place] or covering provider during after hours [place], for this patient?  Check the care team in [facility] and look for a) attending/consulting [facility] provider listed and b) the [facility] team listed Log into [email or web address] and use [place]'s universal password to access. If you do not have the password, please contact the hospital operator. Locate the [facility] provider you are looking for under [facility] and [place] to a number that you can be directly reached. If you still have difficulty reaching the provider, please [place] the [facility] (Director on Call) for the Hospitalists listed on amion for assistance.

## 2021-09-16 NOTE — Assessment & Plan Note (Signed)
-   Continue Wellbutrin and hydroxyzine trazodone, Zoloft, Risperdal, Lamictal

## 2021-09-16 NOTE — Telephone Encounter (Signed)
lmtrc

## 2021-09-16 NOTE — Assessment & Plan Note (Signed)
-   Potassium 6.2 at arrival  -EKG changes present -After potassium cocktail potassium had gone down to 5.1 -Trend again with a.m. labs -Monitor on telemetry -Patient is still with good urine output -Continue IV fluids patient already received bolus -Continue to monitor

## 2021-09-16 NOTE — Assessment & Plan Note (Signed)
-   UA is indicative of UTI -Urine culture pending -Continue Cipro due to medication allergies

## 2021-09-16 NOTE — Assessment & Plan Note (Signed)
-   Continue reduced dose of basal insulin and sliding scale coverage -Last hemoglobin A1c is from 5.9 -Update hemoglobin A1c -Hyperglycemic in the ER 245 -Monitor CBGs

## 2021-09-16 NOTE — Assessment & Plan Note (Signed)
Creatinine increased from 1.2>> 3.1 -Now trending back down to 2.9 -Associated-anemia of 6.2 >> trending down to 5.1 -Patient had regularly scheduled visit with her [facility] [place] [facility] nephrologist.  At that visit they discussed that creatinine was 1.96 at previous visit and had improved to 1.16 at yesterday's visit.  No medication interventions at that time. -Unclear etiology of her AKI at this time.  Possibly poor p.o. intake in the setting of Lasix -Gentle IV hydration, holding Lasix -Hold nephrotoxic agents when possible -Trend in the a.m.

## 2021-09-16 NOTE — Plan of Care (Signed)
  Problem: Acute Rehab PT Goals(only PT should resolve) Goal: Pt Will Go Supine/Side To Sit Outcome: Progressing Flowsheets (Taken 09/16/2021 1158) Pt will go Supine/Side to Sit:  with supervision  with modified independence Goal: Patient Will Transfer Sit To/From Stand Outcome: Progressing Flowsheets (Taken 09/16/2021 1158) Patient will transfer sit to/from stand:  with modified independence  with supervision Goal: Pt Will Transfer Bed To Chair/Chair To Bed Outcome: Progressing Flowsheets (Taken 09/16/2021 1158) Pt will Transfer Bed to Chair/Chair to Bed:  with modified independence  with supervision Goal: Pt Will Ambulate Outcome: Progressing Flowsheets (Taken 09/16/2021 1158) Pt will Ambulate:  25 feet  with min guard assist  with supervision  with rolling walker   12:00 PM, 09/16/21 Lonell Grandchild, MPT Physical Therapist with [facility] [phone] office [phone] mobile phone

## 2021-09-17 DIAGNOSIS — E875 Hyperkalemia: Secondary | ICD-10-CM | POA: Diagnosis not present

## 2021-09-17 DIAGNOSIS — R531 Weakness: Secondary | ICD-10-CM | POA: Diagnosis not present

## 2021-09-17 DIAGNOSIS — N179 Acute kidney failure, unspecified: Secondary | ICD-10-CM | POA: Diagnosis not present

## 2021-09-17 DIAGNOSIS — E039 Hypothyroidism, unspecified: Secondary | ICD-10-CM | POA: Diagnosis not present

## 2021-09-17 LAB — RENAL FUNCTION PANEL
Albumin: 3.5 g/dL (ref 3.5–5.0)
Anion gap: 4 — ABNORMAL LOW (ref 5–15)
BUN: 36 mg/dL — ABNORMAL HIGH (ref 8–23)
CO2: 25 mmol/L (ref 22–32)
Calcium: 8.7 mg/dL — ABNORMAL LOW (ref 8.9–10.3)
Chloride: 113 mmol/L — ABNORMAL HIGH (ref 98–111)
Creatinine, Ser: 1.14 mg/dL — ABNORMAL HIGH (ref 0.44–1.00)
GFR, Estimated: 51 mL/min — ABNORMAL LOW (ref >60)
Glucose, Bld: 101 mg/dL — ABNORMAL HIGH (ref 70–99)
Phosphorus: 2.9 mg/dL (ref 2.5–4.6)
Potassium: 4.9 mmol/L (ref 3.5–5.1)
Sodium: 142 mmol/L (ref 135–145)

## 2021-09-17 LAB — CBC
HCT: 34.5 % — ABNORMAL LOW (ref 36.0–46.0)
Hemoglobin: 10 g/dL — ABNORMAL LOW (ref 12.0–15.0)
MCH: 22.8 pg — ABNORMAL LOW (ref 26.0–34.0)
MCHC: 29 g/dL — ABNORMAL LOW (ref 30.0–36.0)
MCV: 78.6 fL — ABNORMAL LOW (ref 80.0–100.0)
Platelets: 209 10*3/uL (ref 150–400)
RBC: 4.39 MIL/uL (ref 3.87–5.11)
RDW: 15.4 % (ref 11.5–15.5)
WBC: 5.5 10*3/uL (ref 4.0–10.5)
nRBC: 0 % (ref 0.0–0.2)

## 2021-09-17 LAB — GLUCOSE, CAPILLARY
Glucose-Capillary: 148 mg/dL — ABNORMAL HIGH (ref 70–99)
Glucose-Capillary: 89 mg/dL (ref 70–99)

## 2021-09-17 LAB — MAGNESIUM: Magnesium: 2.2 mg/dL (ref 1.7–2.4)

## 2021-09-17 MED ORDER — NOVOLOG FLEXPEN 100 UNIT/ML ~~LOC~~ SOPN: 5.0000 [IU] | PEN_INJECTOR | Freq: Three times a day (TID) | SUBCUTANEOUS | 0 refills | Status: DC

## 2021-09-17 MED ORDER — CIPROFLOXACIN IN D5W 400 MG/200ML IV SOLN: 400.0000 mg | Freq: Two times a day (BID) | INTRAVENOUS | Status: DC

## 2021-09-17 MED ORDER — TOUJEO MAX SOLOSTAR 300 UNIT/ML ~~LOC~~ SOPN: 15.0000 [IU] | PEN_INJECTOR | Freq: Every evening | SUBCUTANEOUS | 1 refills | Status: DC

## 2021-09-17 NOTE — Discharge Instructions (Signed)
IMPORTANT INFORMATION: PAY CLOSE ATTENTION   PHYSICIAN DISCHARGE INSTRUCTIONS  Follow with Primary care provider  [name], Margaret E, MD  and other consultants as instructed by your Hospitalist Physician  SEEK MEDICAL CARE OR RETURN TO EMERGENCY ROOM IF SYMPTOMS COME BACK, WORSEN OR NEW PROBLEM DEVELOPS   Please note: You were cared for by a hospitalist during your hospital stay. Every effort will be made to forward records to your primary care provider.  You can request that your primary care provider send for your hospital records if they have not received them.  Once you are discharged, your primary care physician will handle any further medical issues. Please note that NO REFILLS for any discharge medications will be authorized once you are discharged, as it is imperative that you return to your primary care physician (or establish a relationship with a primary care physician if you do not have one) for your post hospital discharge needs so that they can reassess your need for medications and monitor your lab values.  Please get a complete blood count and chemistry panel checked by your Primary MD at your next visit, and again as instructed by your Primary MD.  Get Medicines reviewed and adjusted: Please take all your medications with you for your next visit with your Primary MD  Laboratory/radiological data: Please request your Primary MD to go over all hospital tests and procedure/radiological results at the follow up, please ask your primary care provider to get all Hospital records sent to his/her office.  In some cases, they will be blood work, cultures and biopsy results pending at the time of your discharge. Please request that your primary care provider follow up on these results.  If you are diabetic, please bring your blood sugar readings with you to your follow up appointment with primary care.    Please call and make your follow up appointments as soon as possible.    Also  Note the following: If you experience worsening of your admission symptoms, develop shortness of breath, life threatening emergency, suicidal or homicidal thoughts you must seek medical attention immediately by calling 911 or calling your MD immediately  if symptoms less severe.  You must read complete instructions/literature along with all the possible adverse reactions/side effects for all the Medicines you take and that have been prescribed to you. Take any new Medicines after you have completely understood and accpet all the possible adverse reactions/side effects.   Do not drive when taking Pain medications or sleeping medications (Benzodiazepines)  Do not take more than prescribed Pain, Sleep and Anxiety Medications. It is not advisable to combine anxiety,sleep and pain medications without talking with your primary care practitioner  Special Instructions: If you have smoked or chewed Tobacco  in the last 2 yrs please stop smoking, stop any regular Alcohol  and or any Recreational drug use.  Wear Seat belts while driving.  Do not drive if taking any narcotic, mind altering or controlled substances or recreational drugs or alcohol.       

## 2021-09-17 NOTE — Progress Notes (Signed)
CSW spoke with [employer] to order bedside commode.  Madilyn Fireman, MSW, LCSW Transitions of Care  Clinical Social Worker II [phone]

## 2021-09-17 NOTE — Discharge Summary (Signed)
Physician Discharge Summary  Sheridan Hew Lubben ZSW:[id number] DOB: [birth year] DOA: 09/15/2021  PCP: [name], MD  Admit date: 09/15/2021 Discharge date: 09/17/2021  Admitted From: Home Disposition: Home  Recommendations for Outpatient Follow-up:  Follow up with PCP in 1 weeks Follow-up with nephrologist Dr. Theador Hawthorne in 1 to 2 weeks Please obtain BMP in 1-2 weeks follow-up renal function and electrolytes  Home Health: Resume prior home health services and aide  Discharge Condition: Stable CODE STATUS: Full Diet: Resume heart healthy  Brief Hospitalization Summary: Please see all hospital notes, images, labs for full details of the hospitalization. ADMISSION HPI:  71 y.o. female with medical history significant of allergies, anemia due to chronic disease, anxiety, mood disorder, chronic constipation, COPD, diabetes mellitus type 2 insulin-dependent, GERD, hyperlipidemia, hypertension, hypothyroidism, SBO, stroke, CKD stage IIIb, and more presents to ED with a chief complaint of generalized weakness.  Patient reports that she had a fall at home because her legs gave out.  She denies any asymmetric weakness.  Prior to her fall she had no chest pain, palpitations, dyspnea, dizziness.  She thinks she might of hit her head.  She did not have any syncope.  She has not had any change in vision aside from her normal chronic blurry vision.  She has not had any change in hearing, or headache.  She reports her speech has been slurred for a very long time.  She did have 1 episode of loose stool today that was brown.  She denies any hematochezia, melena.  Patient denies any dysuria, hematuria, but does report urinary frequency.  She has attributed that to drinking more water.  Patient denies muscle cramping.  She reports she just saw her kidney doctor and was told that her creatinine was better, so she is surprised to hear that she has an AKI now.  Patient has no other complaints at this time.   Patient quit  smoking 1 month ago.  She does not drink alcohol, does not use illicit, and is vaccinated for COVID.  Patient is full code.  Hospital Course   Patient was admitted with acute kidney injury likely secondary to dehydration.  She was treated with IV fluids with good results.  Her creatinine has improved considerably from greater than 3 down to 1.2 with hydration.  She is feeling much better.  She has her own private nephrologist Dr. Theador Hawthorne and will follow-up with him.  She has been evaluated by physical therapy and they have recommended SNF however patient has adamantly declined and reports that she has home health services in addition to an aide that works 40 hours/week with her and close family members who care for her as well.  She is discharging home in stable condition.  Her hyperkalemia has been treated and resolved.  Discharge Diagnoses:  Principal Problem:   AKI (acute kidney injury) ([place]) Active Problems:   Mood disorder ([place])   UTI (urinary tract infection)   Hypothyroidism   Hyperkalemia   Diabetic polyneuropathy associated with type 2 diabetes mellitus ([facility])   Generalized weakness   Abnormal EKG  Discharge Instructions:  Allergies as of 09/17/2021       Reactions   Iron Nausea And Vomiting   And itching And itching   Lac Bovis Rash   Doesn't agree with stomach.  Doesn't agree with stomach.  Doesn't agree with stomach.    Penicillins Hives   Has patient had a PCN reaction causing immediate rash, facial/tongue/throat swelling, SOB or lightheadedness with hypotension: Yes  Has patient had a PCN reaction causing severe rash involving mucus membranes or skin necrosis: No Has patient had a PCN reaction that required hospitalization No Has patient had a PCN reaction occurring within the last 10 years: No If all of the above answers are "NO", then may proceed with Cephalosporin use. Other reaction(s): Other (see comments) Has patient had a PCN reaction causing immediate rash,  facial/tongue/throat swelling, SOB or lightheadedness with hypotension: Yes Has patient had a PCN reaction causing severe rash involving mucus membranes or skin necrosis: No Has patient had a PCN reaction that required hospitalization No Has patient had a PCN reaction occurring within the last 10 years: No If all of the above answers are "NO", then may proceed with Cephalosporin use. Has patient had a PCN reaction causing immediate rash, facial/tongue/throat swelling, SOB or lightheadedness with hypotension: Yes Has patient had a PCN reaction causing severe rash involving mucus membranes or skin necrosis: No Has patient had a PCN reaction that required hospitalization No Has patient had a PCN reaction occurring within the last 10 years: No If all of the above answers are "NO", then may proceed with Cephalosporin use. Has patient had a PCN reaction causing immediate rash, facial/tongue/throat swelling, SOB or lightheadedness with hypotension: Yes Has patient had a PCN reaction causing severe rash involving mucus membranes or skin necrosis: No Has patient had a PCN reaction that required hospitalization No Has patient had a PCN reaction occurring within the last 10 years: No If all of the above answers are "NO", then may proceed with Cephalosporin use. Has patient had a PCN reaction causing immediate rash, facial/tongue/throat swelling, SOB or lightheadedness with hypotension: Yes Has patient had a PCN reaction causing sever... (TRUNCATED)   Phenazopyridine Hives   Phenazopyridine Hcl Hives   Cephalexin Hives   Flonase [fluticasone]    "It gave me ulcers in my nose"   Milk-related Compounds Other (See Comments)   Doesn't agree with stomach.    Phenazopyridine Hcl Hives             Medication List     STOP taking these medications    hydrOXYzine 10 MG tablet Commonly known as: ATARAX   methocarbamol 500 MG tablet Commonly known as: Robaxin   spironolactone 50 MG tablet Commonly  known as: Aldactone   Trulance 3 MG Tabs Generic drug: Plecanatide   UNABLE TO FIND   UNABLE TO FIND   UNABLE TO FIND   UNABLE TO FIND       TAKE these medications    Accu-Chek Guide Control Liqd USE AS DIRECTED   Accu-Chek Guide test strip Generic drug: glucose blood TEST BLOOD SUGAR FOUR TIMES DAILY  AND AS NEEDED   Accu-Chek Softclix Lancets lancets TEST BLOOD SUGAR THREE TIMES DAILY AS DIRECTED   albuterol 108 (90 Base) MCG/ACT inhaler Commonly known as: VENTOLIN HFA INHALE 1 TO 2 PUFFS EVERY 6 HOURS AS NEEDED FOR WHEEZING, SHORTNESS OF BREATH What changed: See the new instructions.   alendronate 70 MG tablet Commonly known as: FOSAMAX TAKE 1 TABLET (70 MG TOTAL) BY MOUTH EVERY 7 (SEVEN) DAYS. TAKE WITH A FULL GLASS OF WATER ON AN EMPTY STOMACH.   amLODipine 10 MG tablet Commonly known as: NORVASC TAKE 1 TABLET EVERY DAY   ascorbic acid 500 MG tablet Commonly known as: VITAMIN C Take 500 mg by mouth daily.   aspirin EC 81 MG tablet Take 1 tablet (81 mg total) by mouth daily with breakfast.   blood glucose meter  kit and supplies Dispense based on patient and insurance preference. Use up to four times daily as directed. (FOR ICD-10 E10.9, E11.9).   buPROPion 150 MG 24 hr tablet Commonly known as: Wellbutrin XL Take 1 tablet (150 mg total) by mouth every morning.   Caltrate 600+D3 Soft 600-20 MG-MCG Chew Generic drug: Calcium Carb-Cholecalciferol Chew 1 tablet by mouth daily at 12 noon.   cetirizine 10 MG tablet Commonly known as: ZYRTEC Take 1 tablet (10 mg total) by mouth daily.   Clobetasol Prop Emollient Base 0.05 % emollient cream Commonly known as: Clobetasol Propionate E Apply to affected area qd What changed:  how much to take how to take this when to take this additional instructions   D3 PO Take 1 tablet by mouth daily.   diclofenac Sodium 1 % Gel Commonly known as: VOLTAREN APPLY 2 GRAMS TOPICALLY FOUR TIMES DAILY. What  changed: See the new instructions.   Droplet Pen Needles 31G X 8 MM Misc Generic drug: Insulin Pen Needle USE FOR INJECTING INSULIN 4 TIMES DAILY.   DropSafe Alcohol Prep 70 % Pads USE TO CLEAN FINGER PRIOR TO TESTING FOR BLOOD SUGAR  AS DIRECTED   DSS 100 MG Caps Take 1 tablet by mouth in the morning and at bedtime.   ezetimibe 10 MG tablet Commonly known as: ZETIA TAKE 1 TABLET EVERY DAY   FISH OIL PO Take 1 capsule by mouth daily.   FreeStyle Libre 14 Day Sensor Misc 1 each by Does not apply route every 14 (fourteen) days. Change every 2 weeks   lamoTRIgine 100 MG tablet Commonly known as: LAMICTAL Take 1 tablet (100 mg total) by mouth 2 (two) times daily.   levothyroxine 50 MCG tablet Commonly known as: SYNTHROID TAKE 1 TABLET EVERY DAY FOR 6 DAYS PER WEEK, 1/2 TABLET 1 DAY PER WEEK   LORazepam 0.5 MG tablet Commonly known as: Ativan Take 1 tablet (0.5 mg total) by mouth 2 (two) times daily.   Mattress Pad Misc FIRM MATTRESS PAD for hospital bed x 1   meclizine 25 MG tablet Commonly known as: ANTIVERT Take 1 tablet (25 mg total) by mouth 3 (three) times daily as needed for dizziness.   metoprolol tartrate 50 MG tablet Commonly known as: LOPRESSOR TAKE 1 TABLET TWICE DAILY (NEED MD APPOINTMENT) What changed: See the new instructions.   mirabegron ER 25 MG Tb24 tablet Commonly known as: MYRBETRIQ Take 1 tablet (25 mg total) by mouth daily.   montelukast 10 MG tablet Commonly known as: SINGULAIR TAKE 1 TABLET EVERY DAY What changed: when to take this   MULTIVITAMIN GUMMIES ADULT PO Take 1 tablet by mouth daily.   NovoLOG FlexPen 100 UNIT/ML FlexPen Generic drug: insulin aspart Inject 5 Units into the skin 3 (three) times daily with meals. Give if eats 50% or more of meal. What changed: See the new instructions.   nystatin powder Commonly known as: MYCOSTATIN/NYSTOP APPLY TO AFFECTED AREA 4 TIMES DAILY. What changed: See the new instructions.    ondansetron 4 MG tablet Commonly known as: Zofran Take 1 tablet (4 mg total) by mouth every 8 (eight) hours as needed for nausea or vomiting.   oxyCODONE-acetaminophen 10-325 MG tablet Commonly known as: PERCOCET Take 1 tablet by mouth every 8 (eight) hours as needed for pain.   pregabalin 75 MG capsule Commonly known as: LYRICA Take 1 capsule (75 mg total) by mouth daily.   RABEprazole 20 MG tablet Commonly known as: ACIPHEX Take 1 tablet (20 mg total)  by mouth 2 (two) times daily.   Restasis 0.05 % ophthalmic emulsion Generic drug: cycloSPORINE Place 2 drops into both eyes daily.   risperiDONE 0.5 MG tablet Commonly known as: RISPERDAL Take 1 tablet (0.5 mg total) by mouth at bedtime.   sertraline 100 MG tablet Commonly known as: ZOLOFT Take 1 tablet (100 mg total) by mouth every morning.   Stiolto Respimat 2.5-2.5 MCG/ACT Aers Generic drug: Tiotropium Bromide-Olodaterol INHALE 2 PUFFS INTO THE LUNGS DAILY.   Toujeo Max SoloStar 300 UNIT/ML Solostar Pen Generic drug: insulin glargine (2 Unit Dial) Inject 15 Units into the skin at bedtime. What changed: how much to take   traZODone 150 MG tablet Commonly known as: DESYREL Take 1 tablet (150 mg total) by mouth at bedtime.               Durable Medical Equipment  (From admission, onward)           Start     Ordered   09/17/21 1005  For home use only DME Bedside commode  Once       Question Answer Comment  Patient needs a bedside commode to treat with the following condition Gait instability   Patient needs a bedside commode to treat with the following condition At high risk for falls      09/17/21 1004            Follow-up Information     [name], MD. Schedule an appointment as soon as possible for a visit in 1 week(s).   Specialty: Family Medicine Why: Hospital Follow Up Contact information: [address], [place] [address] [phone]         Liana Gerold, MD. Schedule an appointment as soon as possible for a visit in 2 week(s).   Specialty: Nephrology Why: Hospital Follow Up Contact information: [address] [place] [address] [phone]                Allergies  Allergen Reactions   Iron Nausea And Vomiting    And itching And itching   Lac Bovis Rash    Doesn't agree with stomach.  Doesn't agree with stomach.  Doesn't agree with stomach.    Penicillins Hives    Has patient had a PCN reaction causing immediate rash, facial/tongue/throat swelling, SOB or lightheadedness with hypotension: Yes Has patient had a PCN reaction causing severe rash involving mucus membranes or skin necrosis: No Has patient had a PCN reaction that required hospitalization No Has patient had a PCN reaction occurring within the last 10 years: No If all of the above answers are "NO", then may proceed with Cephalosporin use.  Other reaction(s): Other (see comments) Has patient had a PCN reaction causing immediate rash, facial/tongue/throat swelling, SOB or lightheadedness with hypotension: Yes Has patient had a PCN reaction causing severe rash involving mucus membranes or skin necrosis: No Has patient had a PCN reaction that required hospitalization No Has patient had a PCN reaction occurring within the last 10 years: No If all of the above answers are "NO", then may proceed with Cephalosporin use. Has patient had a PCN reaction causing immediate rash, facial/tongue/throat swelling, SOB or lightheadedness with hypotension: Yes Has patient had a PCN reaction causing severe rash involving mucus membranes or skin necrosis: No Has patient had a PCN reaction that required hospitalization No Has patient had a PCN reaction occurring within the last 10 years: No If all of the above answers are "  NO", then may proceed with Cephalosporin use. Has patient had a PCN reaction causing immediate rash, facial/tongue/throat swelling, SOB or  lightheadedness with hypotension: Yes Has patient had a PCN reaction causing severe rash involving mucus membranes or skin necrosis: No Has patient had a PCN reaction that required hospitalization No Has patient had a PCN reaction occurring within the last 10 years: No If all of the above answers are "NO", then may proceed with Cephalosporin use. Has patient had a PCN reaction causing immediate rash, facial/tongue/throat swelling, SOB or lightheadedness with hypotension: Yes Has patient had a PCN reaction causing sever... (TRUNCATED)   Phenazopyridine Hives   Phenazopyridine Hcl Hives   Cephalexin Hives   Flonase [Fluticasone]     "It gave me ulcers in my nose"   Milk-Related Compounds Other (See Comments)    Doesn't agree with stomach.    Phenazopyridine Hcl Hives         Allergies as of 09/17/2021       Reactions   Iron Nausea And Vomiting   And itching And itching   Lac Bovis Rash   Doesn't agree with stomach.  Doesn't agree with stomach.  Doesn't agree with stomach.    Penicillins Hives   Has patient had a PCN reaction causing immediate rash, facial/tongue/throat swelling, SOB or lightheadedness with hypotension: Yes Has patient had a PCN reaction causing severe rash involving mucus membranes or skin necrosis: No Has patient had a PCN reaction that required hospitalization No Has patient had a PCN reaction occurring within the last 10 years: No If all of the above answers are "NO", then may proceed with Cephalosporin use. Other reaction(s): Other (see comments) Has patient had a PCN reaction causing immediate rash, facial/tongue/throat swelling, SOB or lightheadedness with hypotension: Yes Has patient had a PCN reaction causing severe rash involving mucus membranes or skin necrosis: No Has patient had a PCN reaction that required hospitalization No Has patient had a PCN reaction occurring within the last 10 years: No If all of the above answers are "NO", then may proceed with  Cephalosporin use. Has patient had a PCN reaction causing immediate rash, facial/tongue/throat swelling, SOB or lightheadedness with hypotension: Yes Has patient had a PCN reaction causing severe rash involving mucus membranes or skin necrosis: No Has patient had a PCN reaction that required hospitalization No Has patient had a PCN reaction occurring within the last 10 years: No If all of the above answers are "NO", then may proceed with Cephalosporin use. Has patient had a PCN reaction causing immediate rash, facial/tongue/throat swelling, SOB or lightheadedness with hypotension: Yes Has patient had a PCN reaction causing severe rash involving mucus membranes or skin necrosis: No Has patient had a PCN reaction that required hospitalization No Has patient had a PCN reaction occurring within the last 10 years: No If all of the above answers are "NO", then may proceed with Cephalosporin use. Has patient had a PCN reaction causing immediate rash, facial/tongue/throat swelling, SOB or lightheadedness with hypotension: Yes Has patient had a PCN reaction causing sever... (TRUNCATED)   Phenazopyridine Hives   Phenazopyridine Hcl Hives   Cephalexin Hives   Flonase [fluticasone]    "It gave me ulcers in my nose"   Milk-related Compounds Other (See Comments)   Doesn't agree with stomach.    Phenazopyridine Hcl Hives             Medication List     STOP taking these medications    hydrOXYzine 10 MG tablet Commonly known  as: ATARAX   methocarbamol 500 MG tablet Commonly known as: Robaxin   spironolactone 50 MG tablet Commonly known as: Aldactone   Trulance 3 MG Tabs Generic drug: Plecanatide   UNABLE TO FIND   UNABLE TO FIND   UNABLE TO FIND   UNABLE TO FIND       TAKE these medications    Accu-Chek Guide Control Liqd USE AS DIRECTED   Accu-Chek Guide test strip Generic drug: glucose blood TEST BLOOD SUGAR FOUR TIMES DAILY  AND AS NEEDED   Accu-Chek Softclix Lancets  lancets TEST BLOOD SUGAR THREE TIMES DAILY AS DIRECTED   albuterol 108 (90 Base) MCG/ACT inhaler Commonly known as: VENTOLIN HFA INHALE 1 TO 2 PUFFS EVERY 6 HOURS AS NEEDED FOR WHEEZING, SHORTNESS OF BREATH What changed: See the new instructions.   alendronate 70 MG tablet Commonly known as: FOSAMAX TAKE 1 TABLET (70 MG TOTAL) BY MOUTH EVERY 7 (SEVEN) DAYS. TAKE WITH A FULL GLASS OF WATER ON AN EMPTY STOMACH.   amLODipine 10 MG tablet Commonly known as: NORVASC TAKE 1 TABLET EVERY DAY   ascorbic acid 500 MG tablet Commonly known as: VITAMIN C Take 500 mg by mouth daily.   aspirin EC 81 MG tablet Take 1 tablet (81 mg total) by mouth daily with breakfast.   blood glucose meter kit and supplies Dispense based on patient and insurance preference. Use up to four times daily as directed. (FOR ICD-10 E10.9, E11.9).   buPROPion 150 MG 24 hr tablet Commonly known as: Wellbutrin XL Take 1 tablet (150 mg total) by mouth every morning.   Caltrate 600+D3 Soft 600-20 MG-MCG Chew Generic drug: Calcium Carb-Cholecalciferol Chew 1 tablet by mouth daily at 12 noon.   cetirizine 10 MG tablet Commonly known as: ZYRTEC Take 1 tablet (10 mg total) by mouth daily.   Clobetasol Prop Emollient Base 0.05 % emollient cream Commonly known as: Clobetasol Propionate E Apply to affected area qd What changed:  how much to take how to take this when to take this additional instructions   D3 PO Take 1 tablet by mouth daily.   diclofenac Sodium 1 % Gel Commonly known as: VOLTAREN APPLY 2 GRAMS TOPICALLY FOUR TIMES DAILY. What changed: See the new instructions.   Droplet Pen Needles 31G X 8 MM Misc Generic drug: Insulin Pen Needle USE FOR INJECTING INSULIN 4 TIMES DAILY.   DropSafe Alcohol Prep 70 % Pads USE TO CLEAN FINGER PRIOR TO TESTING FOR BLOOD SUGAR  AS DIRECTED   DSS 100 MG Caps Take 1 tablet by mouth in the morning and at bedtime.   ezetimibe 10 MG tablet Commonly known as:  ZETIA TAKE 1 TABLET EVERY DAY   FISH OIL PO Take 1 capsule by mouth daily.   FreeStyle Libre 14 Day Sensor Misc 1 each by Does not apply route every 14 (fourteen) days. Change every 2 weeks   lamoTRIgine 100 MG tablet Commonly known as: LAMICTAL Take 1 tablet (100 mg total) by mouth 2 (two) times daily.   levothyroxine 50 MCG tablet Commonly known as: SYNTHROID TAKE 1 TABLET EVERY DAY FOR 6 DAYS PER WEEK, 1/2 TABLET 1 DAY PER WEEK   LORazepam 0.5 MG tablet Commonly known as: Ativan Take 1 tablet (0.5 mg total) by mouth 2 (two) times daily.   Mattress Pad Misc FIRM MATTRESS PAD for hospital bed x 1   meclizine 25 MG tablet Commonly known as: ANTIVERT Take 1 tablet (25 mg total) by mouth 3 (three) times daily  as needed for dizziness.   metoprolol tartrate 50 MG tablet Commonly known as: LOPRESSOR TAKE 1 TABLET TWICE DAILY (NEED MD APPOINTMENT) What changed: See the new instructions.   mirabegron ER 25 MG Tb24 tablet Commonly known as: MYRBETRIQ Take 1 tablet (25 mg total) by mouth daily.   montelukast 10 MG tablet Commonly known as: SINGULAIR TAKE 1 TABLET EVERY DAY What changed: when to take this   MULTIVITAMIN GUMMIES ADULT PO Take 1 tablet by mouth daily.   NovoLOG FlexPen 100 UNIT/ML FlexPen Generic drug: insulin aspart Inject 5 Units into the skin 3 (three) times daily with meals. Give if eats 50% or more of meal. What changed: See the new instructions.   nystatin powder Commonly known as: MYCOSTATIN/NYSTOP APPLY TO AFFECTED AREA 4 TIMES DAILY. What changed: See the new instructions.   ondansetron 4 MG tablet Commonly known as: Zofran Take 1 tablet (4 mg total) by mouth every 8 (eight) hours as needed for nausea or vomiting.   oxyCODONE-acetaminophen 10-325 MG tablet Commonly known as: PERCOCET Take 1 tablet by mouth every 8 (eight) hours as needed for pain.   pregabalin 75 MG capsule Commonly known as: LYRICA Take 1 capsule (75 mg total) by mouth  daily.   RABEprazole 20 MG tablet Commonly known as: ACIPHEX Take 1 tablet (20 mg total) by mouth 2 (two) times daily.   Restasis 0.05 % ophthalmic emulsion Generic drug: cycloSPORINE Place 2 drops into both eyes daily.   risperiDONE 0.5 MG tablet Commonly known as: RISPERDAL Take 1 tablet (0.5 mg total) by mouth at bedtime.   sertraline 100 MG tablet Commonly known as: ZOLOFT Take 1 tablet (100 mg total) by mouth every morning.   Stiolto Respimat 2.5-2.5 MCG/ACT Aers Generic drug: Tiotropium Bromide-Olodaterol INHALE 2 PUFFS INTO THE LUNGS DAILY.   Toujeo Max SoloStar 300 UNIT/ML Solostar Pen Generic drug: insulin glargine (2 Unit Dial) Inject 15 Units into the skin at bedtime. What changed: how much to take   traZODone 150 MG tablet Commonly known as: DESYREL Take 1 tablet (150 mg total) by mouth at bedtime.               Durable Medical Equipment  (From admission, onward)           Start     Ordered   09/17/21 1005  For home use only DME Bedside commode  Once       Question Answer Comment  Patient needs a bedside commode to treat with the following condition Gait instability   Patient needs a bedside commode to treat with the following condition At high risk for falls      09/17/21 1004            Procedures/Studies: CT Cervical Spine Wo Contrast  Result Date: 09/15/2021 CLINICAL DATA:  Status post fall due to weakness. EXAM: CT CERVICAL SPINE WITHOUT CONTRAST TECHNIQUE: Multidetector CT imaging of the cervical spine was performed without intravenous contrast. Multiplanar CT image reconstructions were also generated. RADIATION DOSE REDUCTION: This exam was performed according to the departmental dose-optimization program which includes automated exposure control, adjustment of the mA and/or kV according to patient size and/or use of iterative reconstruction technique. COMPARISON:  October 16, 2020 FINDINGS: Alignment: There is straightening of the normal  cervical spine lordosis. Skull base and vertebrae: No acute fracture. No primary bone lesion or focal pathologic process. Soft tissues and spinal canal: No prevertebral fluid or swelling. No visible canal hematoma. Disc levels: Moderate to marked severity  endplate sclerosis and moderate severity anterior osteophyte formation are seen at the levels of C4-C5, C5-C6, C6-C7 and C7-T1. There is moderate severity narrowing of the anterior atlantoaxial articulation. Moderate to marked severity intervertebral disc space narrowing is seen at C5-C6, C6-C7 and C7-T1. Mild, bilateral multilevel facet joint hypertrophy is noted. Upper chest: Negative. Other: None. IMPRESSION: 1. No acute fracture or subluxation of the cervical spine. 2. Moderate to marked severity multilevel degenerative changes, as described above. Electronically Signed   By: Virgina Norfolk M.D.   On: 09/15/2021 22:26   CT Head Wo Contrast  Result Date: 09/15/2021 CLINICAL DATA:  Status post fall due to weakness. EXAM: CT HEAD WITHOUT CONTRAST TECHNIQUE: Contiguous axial images were obtained from the base of the skull through the vertex without intravenous contrast. RADIATION DOSE REDUCTION: This exam was performed according to the departmental dose-optimization program which includes automated exposure control, adjustment of the mA and/or kV according to patient size and/or use of iterative reconstruction technique. COMPARISON:  November 24, 2020 FINDINGS: Brain: There is mild cerebral atrophy with widening of the extra-axial spaces and ventricular dilatation. There are areas of decreased attenuation within the white matter tracts of the supratentorial brain, consistent with microvascular disease changes. Stable areas of bifrontal encephalomalacia are noted. A small amount of residual meningioma is again noted within the planum sphenoidale. Vascular: Bilateral cavernous carotid artery calcification is present. Skull: A midline frontal craniotomy defect  is seen. Sinuses/Orbits: No acute finding. Other: None. IMPRESSION: 1. Generalized cerebral atrophy and chronic white matter small vessel ischemic changes, without an acute intracranial abnormality. 2. 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 3. Stable small amount of residual meningioma within the planum sphenoidale. Electronically Signed   By: Virgina Norfolk M.D.   On: 09/15/2021 22:22     Subjective: Pt reports feeling much better, wanting to get  home, still refusing SNF, says she has aides and family to care for at home  Discharge Exam: Vitals:   09/16/21 2243 09/17/21 0255  BP: (!) 144/72 136/61  Pulse: 63 60  Resp: 18 18  Temp: 98.3 F (36.8 C) 98.2 F (36.8 C)  SpO2: 93% 100%   Vitals:   09/16/21 1403 09/16/21 1700 09/16/21 2243 09/17/21 0255  BP: (!) 141/58 136/63 (!) 144/72 136/61  Pulse: 70 77 63 60  Resp: _0 Temp: 98.1 F (36.7 C) 98.2 F (36.8 C) 98.3 F (36.8 C) 98.2 F (36.8 C)  TempSrc:  Oral Oral Oral  SpO2: 100% 99% 93% 100%  Weight:      Height:       General: Pt is alert, awake, not in acute distress Cardiovascular: normal S1/S2 +, no rubs, no gallops Respiratory: CTA bilaterally, no wheezing, no rhonchi Abdominal: Soft, NT, ND, bowel sounds + Extremities: no edema, no cyanosis   The results of significant diagnostics from this hospitalization (including imaging, microbiology, ancillary and laboratory) are listed below for reference.     Microbiology: Recent Results (from the past 240 hour(s))  MRSA Next Gen by PCR, Nasal     Status: None   Collection Time: 09/16/21  6:07 AM   Specimen: Nasal Mucosa; Nasal Swab  Result Value Ref Range Status   MRSA by PCR Next Gen NOT DETECTED NOT DETECTED Final    Comment: (NOTE) The GeneXpert MRSA Assay (FDA approved for NASAL specimens only), is one component of a comprehensive MRSA colonization surveillance program. It is not intended to diagnose MRSA infection nor to  guide  or monitor treatment for MRSA infections. Test performance is not FDA approved in patients less than 22 years old. Performed at [facility], [address], [place], [place] [address]      Labs: BNP (last 3 results) No results for input(s): "BNP" in the last 8760 hours. Basic Metabolic Panel: Recent Labs  Lab 09/15/21 1753 09/15/21 2355 09/16/21 0653 09/17/21 0527  NA 136 139 141 142  K 6.2* 5.1 5.6* 4.9  CL 108 110 109 113*  CO2 _0 GLUCOSE 245* 106* 133* 101*  BUN 80* 76* 74* 36*  CREATININE 3.11* 2.94* 2.30* 1.14*  CALCIUM 8.4* 8.1* 8.7* 8.7*  MG  --   --  2.8* 2.2  PHOS  --   --   --  2.9   Liver Function Tests: Recent Labs  Lab 09/16/21 0653 09/17/21 0527  AST 20  --   ALT 24  --   ALKPHOS 68  --   BILITOT 0.5  --   PROT 7.7  --   ALBUMIN 3.9 3.5   No results for input(s): "LIPASE", "AMYLASE" in the last 168 hours. No results for input(s): "AMMONIA" in the last 168 hours. CBC: Recent Labs  Lab 09/15/21 1753 09/16/21 0653 09/17/21 0527  WBC 6.9 7.3 5.5  NEUTROABS 4.4 4.3  --   HGB 10.1* 10.4* 10.0*  HCT 35.6* 35.9* 34.5*  MCV 79.5* 79.8* 78.6*  PLT 231 212 209   Cardiac Enzymes: No results for input(s): "CKTOTAL", "CKMB", "CKMBINDEX", "TROPONINI" in the last 168 hours. BNP: Invalid input(s): "POCBNP" CBG: Recent Labs  Lab 09/16/21 0742 09/16/21 1135 09/16/21 1620 09/16/21 1958 09/17/21 0716  GLUCAP 159* 128* 190* 101* 148*   D-Dimer No results for input(s): "DDIMER" in the last 72 hours. Hgb A1c Recent Labs    09/16/21 0653  HGBA1C 5.6   Lipid Profile No results for input(s): "CHOL", "HDL", "LDLCALC", "TRIG", "CHOLHDL", "LDLDIRECT" in the last 72 hours. Thyroid function studies Recent Labs    09/16/21 0653  TSH 0.710   Anemia work up No results for input(s): "VITAMINB12", "FOLATE", "FERRITIN", "TIBC", "IRON", "RETICCTPCT" in the last 72 hours. Urinalysis    Component Value Date/Time   COLORURINE YELLOW  09/15/2021 2144   APPEARANCEUR CLEAR 09/15/2021 2144   LABSPEC 1.011 09/15/2021 2144   PHURINE 5.0 09/15/2021 2144   GLUCOSEU NEGATIVE 09/15/2021 2144   HGBUR NEGATIVE 09/15/2021 2144   HGBUR negative 03/29/2010 0834   BILIRUBINUR NEGATIVE 09/15/2021 2144   BILIRUBINUR negative 08/31/2021 1434   BILIRUBINUR negative 10/08/2017 [place] 09/15/2021 2144   PROTEINUR NEGATIVE 09/15/2021 2144   UROBILINOGEN 0.2 08/31/2021 1434   UROBILINOGEN 0.2 06/02/2014 0140   NITRITE NEGATIVE 09/15/2021 2144   LEUKOCYTESUR MODERATE (A) 09/15/2021 2144   Sepsis Labs Recent Labs  Lab 09/15/21 1753 09/16/21 0653 09/17/21 0527  WBC 6.9 7.3 5.5   Microbiology Recent Results (from the past 240 hour(s))  MRSA Next Gen by PCR, Nasal     Status: None   Collection Time: 09/16/21  6:07 AM   Specimen: Nasal Mucosa; Nasal Swab  Result Value Ref Range Status   MRSA by PCR Next Gen NOT DETECTED NOT DETECTED Final    Comment: (NOTE) The GeneXpert MRSA Assay (FDA approved for NASAL specimens only), is one component of a comprehensive MRSA colonization surveillance program. It is not intended to diagnose MRSA infection nor to guide or monitor treatment for MRSA infections. Test performance is not FDA approved in patients less than  41 years old. Performed at [facility], [address] [place], [place] [address]    Time coordinating discharge:  35 mins   SIGNED:  Irwin Brakeman, MD  [facility] 09/17/2021, 10:31 AM How to contact the [facility] Attending or Consulting provider [place] or covering provider during after hours [place], for this patient?  Check the care team in [facility] and look for a) attending/consulting [facility] provider listed and b) the [facility] team listed Log into [email or web address] and use [place]'s universal password to access. If you do not have the password, please contact the hospital operator. Locate the [facility] provider you are looking for under [facility] and [place] to a  number that you can be directly reached. If you still have difficulty reaching the provider, please [place] the [facility] (Director on Call) for the Hospitalists listed on amion for assistance.

## 2021-09-18 LAB — URINE CULTURE: Culture: <10000 — AB

## 2021-09-19 ENCOUNTER — Telehealth: Payer: Self-pay

## 2021-09-19 ENCOUNTER — Telehealth: Payer: Self-pay | Admitting: Family Medicine

## 2021-09-19 ENCOUNTER — Encounter: Payer: Self-pay | Admitting: Internal Medicine

## 2021-09-19 ENCOUNTER — Ambulatory Visit (INDEPENDENT_AMBULATORY_CARE_PROVIDER_SITE_OTHER): Payer: Medicare HMO | Admitting: Internal Medicine

## 2021-09-19 VITALS — BP 130/80 | HR 63 | Ht 59.0 in | Wt 164.0 lb

## 2021-09-19 DIAGNOSIS — E039 Hypothyroidism, unspecified: Secondary | ICD-10-CM

## 2021-09-19 DIAGNOSIS — Z794 Long term (current) use of insulin: Secondary | ICD-10-CM | POA: Diagnosis not present

## 2021-09-19 DIAGNOSIS — E1165 Type 2 diabetes mellitus with hyperglycemia: Secondary | ICD-10-CM

## 2021-09-19 DIAGNOSIS — E042 Nontoxic multinodular goiter: Secondary | ICD-10-CM

## 2021-09-19 LAB — POCT GLYCOSYLATED HEMOGLOBIN (HGB A1C): Hemoglobin A1C: 5.5 % (ref 4.0–5.6)

## 2021-09-19 NOTE — Telephone Encounter (Signed)
Patient aware of appointment Thursday at 3:00 and to bring medication to appointment.

## 2021-09-19 NOTE — Telephone Encounter (Signed)
Transition Care Management Follow-up Telephone Call Date of discharge and from where: 09/17/21 [name] How have you been since you were released from the hospital? good Any questions or concerns? No  Items Reviewed: Did the pt receive and understand the discharge instructions provided? Yes  Medications obtained and verified? Yes  Other?  N/a Any new allergies since your discharge? No  Dietary orders reviewed? Yes Do you have support at home? Yes   Home Care and Equipment/Supplies: Were home health services ordered? no If so, what is the name of the agency? N/a  Has the agency set up a time to come to the patient's home? not applicable Were any new equipment or medical supplies ordered?  Yes: Bed side commode. What is the name of the medical supply agency? [facility] Were you able to get the supplies/equipment? Not yet Do you have any questions related to the use of the equipment or supplies? No  Functional Questionnaire: (I = Independent and D = Dependent) ADLs: i  Bathing/Dressing- i  Meal Prep- i  Eating- i  Maintaining continence- D  Transferring/Ambulation- I  Managing Meds- I  Follow up appointments reviewed:  PCP Hospital f/u appt confirmed? Yes  Scheduled to see [name] on 09/22/21 @ 3:00. [name] Hospital f/u appt confirmed? Yes   Are transportation arrangements needed? Yes  If their condition worsens, is the pt aware to call PCP or go to the Emergency Dept.? Yes Was the patient provided with contact information for the PCP's office or ED? Yes Was to pt encouraged to call back with questions or concerns? Yes

## 2021-09-19 NOTE — Progress Notes (Signed)
Patient ID: EMIKO OSORTO, female   DOB: 1950-09-29, 71 y.o.   MRN: [id number]  HPI: ANYAH SWALLOW is a 71 y.o.-year-old female, initially referred by her PCP, [name], presenting for follow-up for DM2, dx 1995, insulin-dependent since 1997, uncontrolled, with complications (gastroparesis, cerebrovasc. Ds-  H/o stroke, PN),  Hypothyroidism, thyroid nodules.  Last visit 4 months ago.  Interim history: At last visit she was still on steroid injections in her shoulder and sugars were high.  She is seeing pain medicine. She also continues to have neck pain, headaches, disequilibrium.  She finished physical therapy. She has blurry vision but no chest pain, palpitations, or increased urination. She was admitted 09/15/2021 after a fall, and found to have dehydration, AKI and hyperkalemia (6.2) -considered the reason for her weakness and subsequent fall.  Glucose was 245.  She was discharged from the hospital with instructions for heart failure (I did not see investigation for this, wondering if this was an error...), And with a lower dose of Toujeo and NovoLog.  At this visit she tells me she is actually not taking NovoLog at all.  She is also not checking blood sugars that she forgot her meter in the hospital.  DM2: Reviewed HbA1c levels: Lab Results  Component Value Date   HGBA1C 5.6 09/16/2021   HGBA1C 5.9 (A) 05/20/2021   HGBA1C 5.9 (A) 01/17/2021  10/28/2013: HbA1c 8.5%  She is on: - Toujeo 23 >> 28 >> 32 >> 24 units at bedtime (30 units with steroids) >> 15 units - Novolog: 10-14 >> 16-18  >> 14-16 units before a meal >> 14 units for smaller meals 16 units for large meals (20-24 units after steroid injections)  >> now none after hospitalization! Could not tolerate Metformin >> nausea.   Insulin doses were decreased in 04/2017 due to low blood sugars in the 40s. Nephrology started [place] but stopped "it did not agree with me" - reportedly.  Pt checks her sugars 4 times a day  - before  the hospitalization: - am:  81-147, 168 >> 99-160, 270-335 (high with steroids) >> 120-174, 250 this am (checked with her husband's machine), 300 with steroids - 2h after b'fast: n/c >> 110 >> n/c - before lunch: 57, 95-150, 178, 193 >> 103-171, 200-385 (high with steroids) >> 89-192 - 2h after lunch:  n/c >> 127, 215, 241 >> n/c - before dinner: 106-191, 196 >> 91-176 (266-401) (high with steroids) >> 120-130 - 2h after dinner: n/c - bedtime: 99-272, 365 >> 60, 104-173 >> 83-190, 194-414 >> 99-100s - nighttime: n/c >> 134 >> n/c Lowest sugar was 64 >> 40s >> 89; she has hypoglycemia awareness in the 70s. Highest sugar was 542 (steroid inj) ... >> 395 (steroid) >> 414 (steroid) >> 300s  Pt's meals are: - Breakfast: eggs, cereals, fruit, oatmeal, bacon - Lunch: sandwich, beef, mashed potatoes,diet soda - Dinner: chicken, beef, fish, potatoes, vegetables - Snacks: 1-2: fruit  -+ Mild CKD; last BUN/creatinine:  Lab Results  Component Value Date   BUN 36 (H) 09/17/2021   CREATININE 1.14 (H) 09/17/2021  On losartan.  -+ HL; last set of lipids: Lab Results  Component Value Date   CHOL 155 05/10/2021   HDL 42 05/10/2021   LDLCALC 79 05/10/2021   TRIG 200 (H) 05/10/2021   CHOLHDL 3.7 05/10/2021  On Crestor and Zetia.  - last eye exam was in 11/2020: + DR; Had cataract sx B. Dr. Hassell Done [facility]). + L eye pain.  -She has numbness  and tingling mainly in the left leg-affected by stroke. Sees podiatry.  She had foot exam by podiatry 02/16/2021.  Hypothyroidism: -Diagnosed many years ago  She takes levothyroxine 50 mcg daily: - in am - fasting - at least 30 min from b'fast - no calcium - no iron - + multivitamins with lunch  - + Aciphex  - no PPIs - +on Biotin and B complex  Reviewed her TFTs: Lab Results  Component Value Date   TSH 0.710 09/16/2021   TSH 0.85 05/19/2021   TSH 0.631 12/11/2019   TSH 0.94 02/17/2019   TSH 0.82 09/30/2018   Thyroid nodules.    Pt  denies: - feeling nodules in neck - hoarseness - SOB with lying down But she continues to have chronic dysphagia with certain foods.  She had previous esophageal dilations. She also has choking.  I reviewed previous imaging and biopsy tests: FNA (2014) x2: Benign  thyroid U/S (10/2013): Right thyroid lobe: 3.7 x 1.0 x 1.8 cm  Left thyroid lobe: 3.5 x 1.5 x 1.6 cm  Isthmus: 0.5 cm  Focal nodules:  There is a 0.3 mm calcified nodule in the right midzone.  There is a 0.6 x 0.7 x 0.5 cm hypoechoic nodule in the  lateral aspect of the right midzone.  There is a 0.7 x 0.6 x 0.5 cm hyperechoic nodule in the lateral aspect of the right lower pole.  There is a 2.2 x 1.1 x 1.7 cm complex solid nodule in the inferior aspect of the isthmus extending adjacent to the left lower pole.  There is a 1.4 x 2.1 x 1.4 cm complex solid nodule in the left lower pole.  Lymphadenopathy: None visualized.  Repeat U/S (01/2014): Stable appearance of the nodules  Repeat U/S (10/2015): stable appearance of the nodules  Repeat U/S (10/2016): Stable appearance of the nodules  Repeat U/S (02/02/2021): Stable appearance of nodules  She had a L rib fracture in 12/2014. She had a R ankle fracture 02/20/2020 >> bone stimulator to help with healing. Patient was admitted with AMS + sepsis on 10/15/2016.  She had aspiration pneumonia and acute respiratory failure She has a recurrent meningioma >> seeing Dr. Rita Ohara.  ROS: + see HPI Neurological: no tremors/+ numbness/+ tingling/no dizziness  I reviewed pt's medications, allergies, PMH, social hx, family hx, and changes were documented in the history of present illness. Otherwise, unchanged from my initial visit note.  Past Medical History:  Diagnosis Date   Allergy    Anemia    Anemia in chronic kidney disease (CKD) 08/10/2021   Anxiety    takes Ativan daily   Arthritis    Assistance needed for mobility    Bipolar disorder ([place])    takes Risperdal nightly    Blood transfusion    Brain tumor ([place])    Cancer ([place])    In her gum   Carpal tunnel syndrome of right wrist 05/23/2011   Cervical disc disorder with radiculopathy of cervical region 10/31/2012   Chronic back pain    Chronic idiopathic constipation    Chronic neck and back pain    Colon polyps    COPD (chronic obstructive pulmonary disease) with chronic bronchitis ([place]) 09/16/2013   Office Spirometry 10/30/2013-submaximal effort based on appearance of loop and curve. Numbers would fit with severe restriction but her physiologic capability may be better than this. FVC 0.91/44%, and 10.74/45%, FEV1/FVC 0.81, FEF 25-75% 1.43/69%     Diabetes mellitus    Type II   Diverticulosis  TCS 9/08 by Dr. Delfin Edis for diarrhea . Bx for micro scopic colitis negative.    Fibromyalgia    Frequent falls    GERD (gastroesophageal reflux disease)    takes Aciphex daily   Glaucoma    eye drops daily   Gum symptoms    infection on antibiotic   Hiatal hernia    Hyperlipidemia    takes Crestor daily   Hypertension    takes Amlodipine,Metoprolol,and Clonidine daily   Hypothyroidism    takes Synthroid daily   IBS (irritable bowel syndrome)    Insomnia    takes Trazodone nightly   Major depression, recurrent ([place])    takes Zoloft daily   Malignant hyperpyrexia 45/05/8880   Metabolic encephalopathy 80/05/4915   Migraines    chronic headaches   Mononeuritis lower limb    Narcolepsy    Osteoporosis    Pancreatitis 2006   due to Depakote with normal EUS    Paralysis ([facility])    Schatzki's ring    non critical / EGD with ED 8/2011with RMR   Seizures ([place])    takes Lamictal daily.Last seizure 3 yrs ago   Sleep apnea    on CPAP   Small bowel obstruction ([facility])    Stroke ([place])    left sided weakness, speech changes   Tubular adenoma of colon    Past Surgical History:  Procedure Laterality Date   ABDOMINAL HYSTERECTOMY  1978   BACK SURGERY  July 2012   BACTERIAL OVERGROWTH TEST N/A  05/05/2013   Procedure: BACTERIAL OVERGROWTH TEST;  Surgeon: Daneil Dolin, MD;  [facility];  Service: Endoscopy;  Laterality: N/A;  7:30   BIOPSY THYROID  2009   BRAIN SURGERY  11/2011   resection of meningioma   BREAST REDUCTION SURGERY  1994   CARDIAC CATHETERIZATION  05/10/2005   normal coronaries, normal LV systolic function and EF (Dr. Jackie Plum)   [place] Left 07/22/04   Dr. Aline Brochure   CATARACT EXTRACTION Bilateral    CHOLECYSTECTOMY  1984   COLONOSCOPY N/A 09/25/2012   HXT:AVWPVXY diverticulosis.  colonic polyp-removed : tubular adenoma   CRANIOTOMY  11/23/2011   Procedure: CRANIOTOMY TUMOR EXCISION;  Surgeon: Hosie Spangle, MD;  Location: [place] [facility]URO ORS;  Service: Neurosurgery;  Laterality: N/A;  Craniotomy for tumor resection   ESOPHAGOGASTRODUODENOSCOPY  12/29/2010   [place]k-Retained food in the esophagus and stomach, small hiatal hernia, status post Maloney dilation of the esophagus   ESOPHAGOGASTRODUODENOSCOPY N/A 09/25/2012   IAX:[id number] atonic baggy esophagus status post Maloney dilation 62 F. Hiatal hernia   GIVENS CAPSULE STUDY N/A 01/15/2013   NORMAL.    IR GENERIC HISTORICAL  03/17/2016   IR RADIOLOGIST EVAL & MGMT 03/17/2016 [facility] RAD   LESION REMOVAL N/A 05/31/2015   Procedure: REMOVAL RIGHT AND LEFT LESIONS OF MANDIBLE;  Surgeon: Diona Browner, DDS;  Location: [facility];  Service: Oral Surgery;  Laterality: N/A;   MALONEY DILATION  12/29/2010   RMR;   NM MYOCAR PERF WALL MOTION  2006   "relavtiely normal" persantine, mild anterior thinning (breast attenuation artifact), no region of scar/ischemia   OVARIAN CYST REMOVAL     RECTOCELE REPAIR N/A 06/29/2015   Procedure: POSTERIOR REPAIR (RECTOCELE);  Surgeon: Jonnie Kind, MD;  Location: [facility];  Service: Gynecology;  Laterality: N/A;   REDUCTION MAMMAPLASTY Bilateral    SPINE SURGERY  09/29/2010   Dr. Rolena Infante   surgical excision of 3 tumors from right thigh and right buttock  and left upper  thigh  2010   TOOTH EXTRACTION Bilateral 12/14/2014   Procedure: REMOVAL OF BILATERAL MANDIBULAR EXOSTOSES;  Surgeon: Diona Browner, DDS;  Location: [facility];  Service: Oral Surgery;  Laterality: Bilateral;   TRANSTHORACIC ECHOCARDIOGRAM  2010   EF 60-65%, mild conc LVH, grade 1 diastolic dysfunction; mildly calcified MV annulus with mildly thickened leaflets, mildly calcified MR annulus   History   Social History   Marital Status: Divorced    Spouse Name: N/A    Number of Children: 1   Years of Education: 12   Occupational History   disabled     Social History Main Topics   Smoking status: Current Every Day Smoker -- 0.25 packs/day for 7 years    Types: Cigarettes   Smokeless tobacco: Never Used     Comment: "started back but off now for 3 months" (08/18/13)   Alcohol Use: No     Comment:     Drug Use: No   Current Outpatient Medications on File Prior to Visit  Medication Sig Dispense Refill   ACCU-CHEK GUIDE test strip TEST BLOOD SUGAR FOUR TIMES DAILY  AND AS NEEDED 450 strip 2   Accu-Chek Softclix Lancets lancets TEST BLOOD SUGAR THREE TIMES DAILY AS DIRECTED 300 each 2   albuterol (VENTOLIN HFA) 108 (90 Base) MCG/ACT inhaler INHALE 1 TO 2 PUFFS EVERY 6 HOURS AS NEEDED FOR WHEEZING, SHORTNESS OF BREATH (Patient taking differently: Inhale 1-2 puffs into the lungs every 6 (six) hours as needed for wheezing or shortness of breath.) 3 each 3   Alcohol Swabs (DROPSAFE ALCOHOL PREP) 70 % PADS USE TO CLEAN FINGER PRIOR TO TESTING FOR BLOOD SUGAR  AS DIRECTED 300 each 1   alendronate (FOSAMAX) 70 MG tablet TAKE 1 TABLET (70 MG TOTAL) BY MOUTH EVERY 7 (SEVEN) DAYS. TAKE WITH A FULL GLASS OF WATER ON AN EMPTY STOMACH. 12 tablet 3   amLODipine (NORVASC) 10 MG tablet TAKE 1 TABLET EVERY DAY (Patient taking differently: Take 10 mg by mouth daily.) 90 tablet 1   ascorbic acid (VITAMIN C) 500 MG tablet Take 500 mg by mouth daily.     aspirin EC 81 MG tablet Take 1 tablet (81 mg total) by mouth daily  with breakfast. 120 tablet 2   Blood Glucose Calibration (ACCU-CHEK GUIDE CONTROL) LIQD USE AS DIRECTED 1 each 0   blood glucose meter kit and supplies Dispense based on patient and insurance preference. Use up to four times daily as directed. (FOR ICD-10 E10.9, E11.9). 1 each 0   buPROPion (WELLBUTRIN XL) 150 MG 24 hr tablet Take 1 tablet (150 mg total) by mouth every morning. 90 tablet 2   Calcium Carb-Cholecalciferol (CALTRATE 600+D3 SOFT) 600-20 MG-MCG CHEW Chew 1 tablet by mouth daily at 12 noon.     cetirizine (ZYRTEC) 10 MG tablet Take 1 tablet (10 mg total) by mouth daily. 30 tablet 1   Cholecalciferol (D3 PO) Take 1 tablet by mouth daily.     Clobetasol Prop Emollient Base (CLOBETASOL PROPIONATE E) 0.05 % emollient cream Apply to affected area qd (Patient taking differently: Apply 1 Application topically daily.) 60 g 6   Continuous Blood Gluc Sensor (FREESTYLE LIBRE 14 DAY SENSOR) MISC 1 each by Does not apply route every 14 (fourteen) days. Change every 2 weeks 2 each 11   diclofenac Sodium (VOLTAREN) 1 % GEL APPLY 2 GRAMS TOPICALLY FOUR TIMES DAILY. (Patient taking differently: Apply 2 g topically 4 (four) times daily.) 700 g 0  Docusate Sodium (DSS) 100 MG CAPS Take 1 tablet by mouth in the morning and at bedtime.     DROPLET PEN NEEDLES 31G X 8 MM MISC USE FOR INJECTING INSULIN 4 TIMES DAILY. 400 each 0   ezetimibe (ZETIA) 10 MG tablet TAKE 1 TABLET EVERY DAY (Patient taking differently: Take 10 mg by mouth daily.) 90 tablet 1   insulin aspart (NOVOLOG FLEXPEN) 100 UNIT/ML FlexPen Inject 5 Units into the skin 3 (three) times daily with meals. Give if eats 50% or more of meal. 60 mL 0   insulin glargine, 2 Unit Dial, (TOUJEO MAX SOLOSTAR) 300 UNIT/ML Solostar Pen Inject 15 Units into the skin at bedtime. 9 mL 1   lamoTRIgine (LAMICTAL) 100 MG tablet Take 1 tablet (100 mg total) by mouth 2 (two) times daily. 180 tablet 2   levothyroxine (SYNTHROID) 50 MCG tablet TAKE 1 TABLET EVERY DAY  FOR 6 DAYS PER WEEK, 1/2 TABLET 1 DAY PER WEEK 90 tablet 1   LORazepam (ATIVAN) 0.5 MG tablet Take 1 tablet (0.5 mg total) by mouth 2 (two) times daily. 60 tablet 2   meclizine (ANTIVERT) 25 MG tablet Take 1 tablet (25 mg total) by mouth 3 (three) times daily as needed for dizziness. 30 tablet 0   metoprolol tartrate (LOPRESSOR) 50 MG tablet TAKE 1 TABLET TWICE DAILY (NEED MD APPOINTMENT) (Patient taking differently: Take 50 mg by mouth 2 (two) times daily.) 180 tablet 3   mirabegron ER (MYRBETRIQ) 25 MG TB24 tablet Take 1 tablet (25 mg total) by mouth daily. 90 tablet 1   Misc. Devices (MATTRESS PAD) MISC FIRM MATTRESS PAD for hospital bed x 1 1 each 0   montelukast (SINGULAIR) 10 MG tablet TAKE 1 TABLET EVERY DAY (Patient taking differently: Take 10 mg by mouth at bedtime.) 90 tablet 1   Multiple Vitamins-Minerals (MULTIVITAMIN GUMMIES ADULT PO) Take 1 tablet by mouth daily.     nystatin (MYCOSTATIN/NYSTOP) powder APPLY TO AFFECTED AREA 4 TIMES DAILY. (Patient taking differently: Apply 1 Application topically in the morning, at noon, in the evening, and at bedtime.) 30 g 2   Omega-3 Fatty Acids (FISH OIL PO) Take 1 capsule by mouth daily.     ondansetron (ZOFRAN) 4 MG tablet Take 1 tablet (4 mg total) by mouth every 8 (eight) hours as needed for nausea or vomiting. 20 tablet 2   oxyCODONE-acetaminophen (PERCOCET) 10-325 MG tablet Take 1 tablet by mouth every 8 (eight) hours as needed for pain.     pregabalin (LYRICA) 75 MG capsule Take 1 capsule (75 mg total) by mouth daily.     RABEprazole (ACIPHEX) 20 MG tablet Take 1 tablet (20 mg total) by mouth 2 (two) times daily. 180 tablet 2   RESTASIS 0.05 % ophthalmic emulsion Place 2 drops into both eyes daily.     risperiDONE (RISPERDAL) 0.5 MG tablet Take 1 tablet (0.5 mg total) by mouth at bedtime. 90 tablet 2   sertraline (ZOLOFT) 100 MG tablet Take 1 tablet (100 mg total) by mouth every morning. 90 tablet 2   STIOLTO RESPIMAT 2.5-2.5 MCG/ACT AERS  INHALE 2 PUFFS INTO THE LUNGS DAILY. (Patient taking differently: Inhale 2 puffs into the lungs daily.) 12 g 1   traZODone (DESYREL) 150 MG tablet Take 1 tablet (150 mg total) by mouth at bedtime. 90 tablet 2   No current facility-administered medications on file prior to visit.      Allergies  Allergen Reactions   Cephalexin Hives   Iron Nausea And  Vomiting   Milk-Related Compounds Other (See Comments)    Doesn't agree with stomach.    Penicillins Hives        Phenazopyridine Hcl Hives         Family History  Problem Relation Age of Onset   Heart attack Mother        HTN   Pneumonia Father    Kidney failure Father    Diabetes Father    Pancreatic cancer Sister    Cancer Sister        breast    Cancer Sister        pancreatic   Diabetes Brother    Hypertension Brother    Diabetes Brother    Alcohol abuse Maternal Uncle    Stroke Maternal Grandmother    Heart attack Maternal Grandfather    Hypertension Son    Sleep apnea Son    Colon cancer Neg Hx    Anesthesia problems Neg Hx    Hypotension Neg Hx    Malignant hyperthermia Neg Hx    Pseudochol deficiency Neg Hx    Breast cancer Neg Hx    Stomach cancer Neg Hx    PE: BP 130/80 (BP Location: Left Arm, Patient Position: Sitting, Cuff Size: Normal)   Pulse 63   Ht 4' 11"  (1.499 m)   Wt 164 lb (74.4 kg)   SpO2 99%   BMI 33.12 kg/m    Wt Readings from Last 3 Encounters:  09/19/21 164 lb (74.4 kg)  09/16/21 165 lb 2 oz (74.9 kg)  08/31/21 161 lb (73 kg)   Constitutional: overweight, in NAD Eyes: PERRLA, EOMI, no exophthalmos ENT: moist mucous membranes, no thyromegaly, no cervical lymphadenopathy Cardiovascular: RRR, No MRG Respiratory: CTA B Musculoskeletal: no deformities Skin: moist, warm, no rashes Neurological: no tremor with outstretched hands  ASSESSMENT: 1. DM2, insulin-dependent, uncontrolled, without complications - gastroparesis - cerebrovasc. Ds -  H/o stroke - PN  She was interested  in an insulin pump >> discussed about [employer] (given brochure).  2. Hypothyroidism  3. MNG - Thyroid U/S (10/11/2012): Right thyroid lobe:  3.7 x 1.0 x 1.8 cm Left thyroid lobe:   3.5 x 1.5 x 1.6 cm Isthmus:  0.5 cm Focal nodules:  There is a 0.3 mm calcified nodule in the right midzone.  There is a 0.6 x 0.7 x 0.5 cm hypoechoic nodule in the lateral aspect of the right midzone.  There is a 0.7 x 0.6 x 0.5 cm hyperechoic nodule in the lateral aspect of the right lower pole. There is a 2.2 x 1.1 x 1.7 cm complex solid nodule in the inferior aspect of the isthmus extending adjacent to the left lower pole. There is a 1.4 x 2.1 x 1.4 cm complex solid nodule in the left lower pole.  Lymphadenopathy:  None visualized.   IMPRESSION: Multi nodular goiter which has markedly progressed since the prior exam.  The dominant nodule at the inferior aspect of the isthmus fits criteria for fine needle aspiration biopsy if not previously Assessed.  - FNA (11/05/2012) x2: benign  - Thyroid U/S (01/20/2014) - felt one nodule enlarging >> new U/S: stable appearance of the nodules, except a small new nodule in isthmus: 1.2 cm  Right thyroid lobe Measurements: 4.4 cm x 1.2 cm x 1.7 cm. Multiple right-sided nodules identified. Each right-sided nodule demonstrates increased echogenicity, with the superior measuring 7 mm -8 mm, most inferior measuring 9 mm - 10 mm. A small, 3 mm focus of calcium with  posterior shadowing is evident. There is also a mid nodule measuring 7 mm, which is echogenic. Left thyroid lobe Measurements: 5.1 cm x 2.1 cm x 2.3 cm. Dominant lesion at the inferior pole of left thyroid is again evident, which has been previously biopsied (10/29/2012). This nodule measures 1.8 cm x 1.7cm x 2.5 cm. (Previous 1.4 cm x 2.1 cm x 1.4 cm) Isthmus Thickness: 4 mm-5 mm. Isthmic nodule again noted, previously biopsied (10/29/2012). Currently this nodule measures 2.2 cm x 1.4 cm x 1.8 cm. (previous measurement  2.2 cm x 1.1 cm x 1.7 cm). There is a new nodule identified within the isthmus with heterogeneously hyperechoic characteristics. This nodule measures 12 mm x 5.4 mm x 7.2 mm. Lymphadenopathy: None visualized.  - Thyroid U/S (11/09/2015): Parenchymal Echotexture: Moderately heterogenous Estimated total number of nodules > 1 cm: <5 Number of spongiform nodules > 2 cm not described below (TR1): 0 Number of mixed cystic nodules > 1.5 cm not described below ([place]): 0 _____________________________________________________  Isthmus: 0.7 cm  Nodule # 1: Prior biopsy: No Location: Isthmus; left Size: 1.5 x 1.3 x 1.5 cm, previously 2.2 x 1.4 x 1.8 cm Composition: solid/almost completely solid (2) Echogenicity: hyperechoic (1) ACR TI-RADS total points: 3. Change in features: Yes. Increased internal cystic degeneration and smaller in size. Change in ACR TI-RADS risk category: No  *Given size (1.5 - 2.4 cm) and appearance, a follow-up ultrasound in 1 year is recommended based on TI-RADS criteria as clinically indicated.  _________________________________________________________  Right lobe: 4.6 x 1.1 x 1.9 cm, previously 4.4 x 1.2 x 1.7 cm Stable scattered subcentimeter echogenic nodules _________________________________________________________  Left lobe: 4.8 x 2.1 x 1.9 cm, previously 5.1 x 2.1 x 2.3 cm Nodule # 1: Prior biopsy: No Location: Left; Inferior Size: 2.4 x 1.7 x 1.9 cm, previously 2.5 x 1.7 x 1.7 Composition: solid/almost completely solid (2) Echogenicity: hyperechoic (1) ACR TI-RADS total points: 3. ACR TI-RADS risk category: TR3 (3 points). Change in features: No Change in ACR TI-RADS risk category: No *Given size (1.5 - 2.4 cm) and appearance, a follow-up ultrasound in 1 year is recommended based on TI-RADS criteria as clinically indicated.  IMPRESSION: TR 3 isthmic and left thyroid nodules unchanged. *Given size (1.5 - 2.4 cm) and appearance of both thyroid  nodules, a follow-up ultrasound in 1 year is recommended based on TI-RADS criteria as clinically indicated.  10/12/2016: Thyroid U/S: Parenchymal Echotexture: Moderately heterogenous Isthmus: 1.1 cm Right lobe: 3.8 x 1.0 x 1.7 cm Left lobe: 4.5 x 1.7 x 2.1 cm  ______________________________________________________   Estimated total number of nodules >/= 1 cm: 3  _____________________________________________   Diffusely heterogeneous multinodular thyroid gland. As seen previously, there are multiple small echogenic nodules scattered throughout the right gland which do not meet criteria for biopsy or follow-up. Head and lobular pyramidal lobe extends superiorly from the thyroid isthmus. No interval change.   The previously biopsied nodule in the inferior aspect of the isthmus measures slightly smaller today at 1.5 x 1.0 x 1.4 cm compared to 1.7 x 1.3 x 1.5 cm previously.   The previously biopsied nodule in the left inferior gland is essentially unchanged at 2.6 x 1.3 x 1.5 cm compared to 2.4 x 1.8 x 1.9 cm. No new nodule or abnormality identified.   IMPRESSION: 1. Stable multinodular goiter with a prominent lobular parental lobe extending superiorly from the thyroid isthmus. 2. The previously biopsied nodule in the inferior isthmus measures slightly smaller on today's examination. 3. The previously  biopsied nodule in the left inferior gland is stable. 4. Additional scattered small and benign-appearing nodules do not meet criteria for biopsy or continued follow-up.  Thyroid U/S (02/02/2021): Parenchymal Echotexture: Mildly heterogeneous Isthmus: 0.6 cm Right lobe: 4.4 x 0.8 x 1.7 cm  Left lobe: 4.5 x 2.1 x 3.6 cm  ______________________________________________________   Nodule # 1:  Prior biopsy: Yes-10/29/2012  Location: Isthmus; superior  Maximum size: 2.6 cm; Other 2 dimensions: 1.0 x 2.0 cm, previously, 1.5 x 1.0 x 1.4 cm  Composition: mixed cystic and solid (1)   Echogenicity: isoechoic (1) Significant change in size (>/= 20% in two dimensions and minimal increase of 2 mm): Yes (predominately due to increase in cystic component) Change in features: Yes Change in ACR TI-RADS risk category: Yes This nodule does NOT meet TI-RADS criteria for biopsy or dedicated follow-up.  _________________________________________________________   Nodule 2: 1.0 x 0.8 x 0.8 cm hypoechoic (actually hyperechoic) nodule in the inferior right thyroid lobe does (NOT) meet criteria for FNA or imaging surveillance. _________________________________________________________   There is diffuse heterogeneity of the left thyroid lobe without discrete nodules.   IMPRESSION: Previously biopsied isthmus nodule demonstrates interval development of cystic component. It does not meet criteria for repeat biopsy at this time. No new suspicious thyroid nodules.   This did not show appears to have increased in size, but this is actually due to the increased cystic component.  This nodule has been previously biopsied with benign results. Called and discussed with Dr.Mir with [facility] and he acknowledged that the changes in red above have been dictation errors and he addended the report.  PLAN:  1. Patient with longstanding, uncontrolled, type 2 diabetes, on basal/bolus insulin regimen, with fluctuating CBGs with higher blood sugars whenever she is getting steroid injections.  HbA1c remained controlled, at 5.9%, at last visit.  She was not able to obtain a CGM at last visit. -At her last visit, sugars were at or close to goal but with sugars in the 300s to 400s on oral steroids.  I advised her to take a higher dose of Toujeo or NovoLog whenever she had to get steroids.  At that time, she also inquired about an insulin pump.  I do not feel she is a good candidate for the pump due to the complexities of maneuvering the device. -At today's visit, she mentions she did not check blood  sugars except for this morning, after her discharge from the hospital as she left her meter in the hospital.  This morning her blood sugar was in the 250s.  Upon questioning, her insulin doses were decreased in the hospital and she was confused about NovoLog dosing when she reached home so she stopped taking NovoLog completely.  We discussed that her HbA1c levels are not very accurate, and we only monitor these for trends.  She actually does need more insulin and I advised her to increase the dose of Toujeo and restart NovoLog at a higher dose. -With her history of dehydration and hyperkalemia in the setting of AKI, she is not a good candidate for an SGLT2 inhibitor.  In fact, this was tried by her nephrologist but further testing prompted discontinuation, reportedly.  I do not have these. -At today's visit we gave her a new meter.  She also has a CGM at home and I referred her to diabetes education to have this attached. - I advised her to:  Patient Instructions  Please increase: - Toujeo 20 units daily (25 units after  steroid injections) - Novolog  10 units for smaller meals 15 units for large meals (20 units if on steroid injections)   Please continue levothyroxine 50 mcg daily.   Take the thyroid hormone every day, with water, at least 30 minutes before breakfast, separated by at least 4 hours from: - acid reflux medications - calcium - iron - multivitamins   Please return in 4 months.  - we checked her HbA1c: 5.5% (even lower) - advised to check sugars at different times of the day - 4x a day, rotating check times - advised for yearly eye exams >> she is UTD - return to clinic in 4 months   2. Hypothyroidism - latest thyroid labs reviewed with pt. >> normal: Lab Results  Component Value Date   TSH 0.710 09/16/2021  - she continues on LT4 50 mcg daily - pt feels good on this dose. - we discussed about taking the thyroid hormone every day, with water, >30 minutes before  breakfast, separated by >4 hours from acid reflux medications, calcium, iron, multivitamins. Pt. is taking it correctly.  At last visit was still taking Aciphex at the same time with levothyroxine (they were at the same place in her pillbox)  -I advised the home health nurse to move Aciphex at least 4 hours later (wrote it down for the patient to show it to the home health nurse).  At this visit, it is unclear if this was most later in the day, but it is possible, since her TSH was excellent at last check  3. MNG -She has a history of 2 benign thyroid nodule biopsied in 2014 -Reviewing the reports of her thyroid ultrasound from 2014-2022, these nodules have remained stable -She still had dysphagia last year (however, she has chronic mild dysphagia with no esophageal compression from the area swallow study from 08/2014 -she has a history of multiple esophageal dilations). We checked another ultrasound.  Reviewing the results, one of the nodules appeared to be larger, but this was due to an increase in the cystic component.  Since the nodules are stable, no further intervention is needed   Philemon Kingdom, MD PhD [facility]

## 2021-09-19 NOTE — Telephone Encounter (Signed)
Patient was seen in ED. 

## 2021-09-19 NOTE — Patient Instructions (Addendum)
Please increase: - Toujeo 20 units daily (25 units after steroid injections) - Novolog  10 units for smaller meals 15 units for large meals (20 units if on steroid injections)   Please continue levothyroxine 50 mcg daily.   Take the thyroid hormone every day, with water, at least 30 minutes before breakfast, separated by at least 4 hours from: - acid reflux medications - calcium - iron - multivitamins   Please return in 4 months.

## 2021-09-19 NOTE — Telephone Encounter (Signed)
Pt called stating her medications were changed when she was in this hospital and she is confused on what she needs to be taking. Wants to know if you can please call her?

## 2021-09-20 ENCOUNTER — Telehealth: Payer: Self-pay | Admitting: Family Medicine

## 2021-09-20 NOTE — Telephone Encounter (Signed)
I ordered the rolling walker with arm supports like the note said

## 2021-09-20 NOTE — Telephone Encounter (Signed)
Pt called stating that the walker was delivered but it is the wrong one. She is unable to use this one. Wants to know if you can please call her?

## 2021-09-21 NOTE — Telephone Encounter (Signed)
Changed order to a rollator

## 2021-09-22 ENCOUNTER — Inpatient Hospital Stay (HOSPITAL_COMMUNITY)
Admission: EM | Admit: 2021-09-22 | Discharge: 2021-09-24 | DRG: 684 | Disposition: A | Discharge status: Home / Self Care | Payer: Medicare HMO | Attending: Internal Medicine | Admitting: Internal Medicine

## 2021-09-22 ENCOUNTER — Telehealth: Payer: Self-pay | Admitting: Nurse Practitioner

## 2021-09-22 ENCOUNTER — Encounter: Payer: Self-pay | Admitting: Family Medicine

## 2021-09-22 ENCOUNTER — Encounter (HOSPITAL_COMMUNITY): Payer: Self-pay | Admitting: Emergency Medicine

## 2021-09-22 ENCOUNTER — Other Ambulatory Visit: Payer: Self-pay

## 2021-09-22 ENCOUNTER — Other Ambulatory Visit (HOSPITAL_COMMUNITY)
Admission: RE | Admit: 2021-09-22 | Discharge: 2021-09-22 | Disposition: A | Discharge status: Home / Self Care | Payer: Medicare HMO | Attending: Family Medicine | Admitting: Family Medicine

## 2021-09-22 ENCOUNTER — Ambulatory Visit (INDEPENDENT_AMBULATORY_CARE_PROVIDER_SITE_OTHER): Payer: Medicare HMO | Admitting: Family Medicine

## 2021-09-22 VITALS — BP 120/62 | HR 69 | Ht 59.0 in

## 2021-09-22 DIAGNOSIS — R569 Unspecified convulsions: Secondary | ICD-10-CM | POA: Diagnosis present

## 2021-09-22 DIAGNOSIS — I10 Essential (primary) hypertension: Secondary | ICD-10-CM | POA: Diagnosis present

## 2021-09-22 DIAGNOSIS — Z8249 Family history of ischemic heart disease and other diseases of the circulatory system: Secondary | ICD-10-CM

## 2021-09-22 DIAGNOSIS — N1831 Chronic kidney disease, stage 3a: Secondary | ICD-10-CM | POA: Diagnosis not present

## 2021-09-22 DIAGNOSIS — G8929 Other chronic pain: Secondary | ICD-10-CM | POA: Diagnosis present

## 2021-09-22 DIAGNOSIS — Z8673 Personal history of transient ischemic attack (TIA), and cerebral infarction without residual deficits: Secondary | ICD-10-CM

## 2021-09-22 DIAGNOSIS — J449 Chronic obstructive pulmonary disease, unspecified: Secondary | ICD-10-CM | POA: Diagnosis present

## 2021-09-22 DIAGNOSIS — M810 Age-related osteoporosis without current pathological fracture: Secondary | ICD-10-CM | POA: Diagnosis present

## 2021-09-22 DIAGNOSIS — E860 Dehydration: Secondary | ICD-10-CM | POA: Diagnosis present

## 2021-09-22 DIAGNOSIS — Z794 Long term (current) use of insulin: Secondary | ICD-10-CM

## 2021-09-22 DIAGNOSIS — K219 Gastro-esophageal reflux disease without esophagitis: Secondary | ICD-10-CM | POA: Diagnosis present

## 2021-09-22 DIAGNOSIS — Z880 Allergy status to penicillin: Secondary | ICD-10-CM

## 2021-09-22 DIAGNOSIS — K589 Irritable bowel syndrome without diarrhea: Secondary | ICD-10-CM | POA: Diagnosis present

## 2021-09-22 DIAGNOSIS — Z86010 Personal history of colonic polyps: Secondary | ICD-10-CM

## 2021-09-22 DIAGNOSIS — Z7689 Persons encountering health services in other specified circumstances: Secondary | ICD-10-CM | POA: Diagnosis not present

## 2021-09-22 DIAGNOSIS — Z87891 Personal history of nicotine dependence: Secondary | ICD-10-CM

## 2021-09-22 DIAGNOSIS — E875 Hyperkalemia: Secondary | ICD-10-CM | POA: Diagnosis not present

## 2021-09-22 DIAGNOSIS — N179 Acute kidney failure, unspecified: Secondary | ICD-10-CM | POA: Diagnosis not present

## 2021-09-22 DIAGNOSIS — M549 Dorsalgia, unspecified: Secondary | ICD-10-CM | POA: Diagnosis present

## 2021-09-22 DIAGNOSIS — E1143 Type 2 diabetes mellitus with diabetic autonomic (poly)neuropathy: Secondary | ICD-10-CM | POA: Diagnosis present

## 2021-09-22 DIAGNOSIS — Z9841 Cataract extraction status, right eye: Secondary | ICD-10-CM

## 2021-09-22 DIAGNOSIS — E782 Mixed hyperlipidemia: Secondary | ICD-10-CM | POA: Diagnosis present

## 2021-09-22 DIAGNOSIS — Z9842 Cataract extraction status, left eye: Secondary | ICD-10-CM

## 2021-09-22 DIAGNOSIS — K3184 Gastroparesis: Secondary | ICD-10-CM | POA: Diagnosis not present

## 2021-09-22 DIAGNOSIS — Z9049 Acquired absence of other specified parts of digestive tract: Secondary | ICD-10-CM

## 2021-09-22 DIAGNOSIS — Z841 Family history of disorders of kidney and ureter: Secondary | ICD-10-CM

## 2021-09-22 DIAGNOSIS — G47419 Narcolepsy without cataplexy: Secondary | ICD-10-CM | POA: Diagnosis present

## 2021-09-22 DIAGNOSIS — E1165 Type 2 diabetes mellitus with hyperglycemia: Secondary | ICD-10-CM | POA: Diagnosis present

## 2021-09-22 DIAGNOSIS — E119 Type 2 diabetes mellitus without complications: Secondary | ICD-10-CM | POA: Diagnosis present

## 2021-09-22 DIAGNOSIS — Z7983 Long term (current) use of bisphosphonates: Secondary | ICD-10-CM

## 2021-09-22 DIAGNOSIS — M797 Fibromyalgia: Secondary | ICD-10-CM | POA: Diagnosis present

## 2021-09-22 DIAGNOSIS — F319 Bipolar disorder, unspecified: Secondary | ICD-10-CM | POA: Diagnosis present

## 2021-09-22 DIAGNOSIS — E876 Hypokalemia: Secondary | ICD-10-CM | POA: Diagnosis present

## 2021-09-22 DIAGNOSIS — Z7982 Long term (current) use of aspirin: Secondary | ICD-10-CM

## 2021-09-22 DIAGNOSIS — Z888 Allergy status to other drugs, medicaments and biological substances status: Secondary | ICD-10-CM

## 2021-09-22 DIAGNOSIS — Z823 Family history of stroke: Secondary | ICD-10-CM

## 2021-09-22 DIAGNOSIS — H409 Unspecified glaucoma: Secondary | ICD-10-CM | POA: Diagnosis present

## 2021-09-22 DIAGNOSIS — E039 Hypothyroidism, unspecified: Secondary | ICD-10-CM | POA: Diagnosis present

## 2021-09-22 DIAGNOSIS — I129 Hypertensive chronic kidney disease with stage 1 through stage 4 chronic kidney disease, or unspecified chronic kidney disease: Secondary | ICD-10-CM | POA: Diagnosis present

## 2021-09-22 DIAGNOSIS — Z91011 Allergy to milk products: Secondary | ICD-10-CM

## 2021-09-22 DIAGNOSIS — Z79899 Other long term (current) drug therapy: Secondary | ICD-10-CM

## 2021-09-22 DIAGNOSIS — D649 Anemia, unspecified: Secondary | ICD-10-CM | POA: Diagnosis not present

## 2021-09-22 DIAGNOSIS — Z881 Allergy status to other antibiotic agents status: Secondary | ICD-10-CM

## 2021-09-22 DIAGNOSIS — E1122 Type 2 diabetes mellitus with diabetic chronic kidney disease: Secondary | ICD-10-CM | POA: Diagnosis present

## 2021-09-22 DIAGNOSIS — Z79890 Hormone replacement therapy (postmenopausal): Secondary | ICD-10-CM

## 2021-09-22 DIAGNOSIS — Z91048 Other nonmedicinal substance allergy status: Secondary | ICD-10-CM

## 2021-09-22 DIAGNOSIS — Z90710 Acquired absence of both cervix and uterus: Secondary | ICD-10-CM

## 2021-09-22 DIAGNOSIS — Z833 Family history of diabetes mellitus: Secondary | ICD-10-CM

## 2021-09-22 LAB — COMPREHENSIVE METABOLIC PANEL
ALT: 31 U/L (ref 0–44)
AST: 22 U/L (ref 15–41)
Albumin: 4 g/dL (ref 3.5–5.0)
Alkaline Phosphatase: 65 U/L (ref 38–126)
Anion gap: 4 — ABNORMAL LOW (ref 5–15)
BUN: 56 mg/dL — ABNORMAL HIGH (ref 8–23)
CO2: 25 mmol/L (ref 22–32)
Calcium: 9.7 mg/dL (ref 8.9–10.3)
Chloride: 108 mmol/L (ref 98–111)
Creatinine, Ser: 2.59 mg/dL — ABNORMAL HIGH (ref 0.44–1.00)
GFR, Estimated: 19 mL/min — ABNORMAL LOW (ref >60)
Glucose, Bld: 88 mg/dL (ref 70–99)
Potassium: 6.4 mmol/L (ref 3.5–5.1)
Sodium: 137 mmol/L (ref 135–145)
Total Bilirubin: 0.4 mg/dL (ref 0.3–1.2)
Total Protein: 6.9 g/dL (ref 6.5–8.1)

## 2021-09-22 LAB — URINALYSIS, ROUTINE W REFLEX MICROSCOPIC
Bilirubin Urine: NEGATIVE
Glucose, UA: NEGATIVE mg/dL
Hgb urine dipstick: NEGATIVE
Ketones, ur: NEGATIVE mg/dL
Leukocytes,Ua: NEGATIVE
Nitrite: NEGATIVE
Protein, ur: NEGATIVE mg/dL
Specific Gravity, Urine: 1.013 (ref 1.005–1.030)
pH: 5 (ref 5.0–8.0)

## 2021-09-22 LAB — BASIC METABOLIC PANEL
Anion gap: 5 (ref 5–15)
BUN: 56 mg/dL — ABNORMAL HIGH (ref 8–23)
CO2: 23 mmol/L (ref 22–32)
Calcium: 9.5 mg/dL (ref 8.9–10.3)
Chloride: 108 mmol/L (ref 98–111)
Creatinine, Ser: 2.46 mg/dL — ABNORMAL HIGH (ref 0.44–1.00)
GFR, Estimated: 20 mL/min — ABNORMAL LOW (ref >60)
Glucose, Bld: 157 mg/dL — ABNORMAL HIGH (ref 70–99)
Potassium: 5.7 mmol/L — ABNORMAL HIGH (ref 3.5–5.1)
Sodium: 136 mmol/L (ref 135–145)

## 2021-09-22 LAB — CBC
HCT: 32.5 % — ABNORMAL LOW (ref 36.0–46.0)
Hemoglobin: 9.2 g/dL — ABNORMAL LOW (ref 12.0–15.0)
MCH: 22.7 pg — ABNORMAL LOW (ref 26.0–34.0)
MCHC: 28.3 g/dL — ABNORMAL LOW (ref 30.0–36.0)
MCV: 80 fL (ref 80.0–100.0)
Platelets: 239 10*3/uL (ref 150–400)
RBC: 4.06 MIL/uL (ref 3.87–5.11)
RDW: 15.5 % (ref 11.5–15.5)
WBC: 7.3 10*3/uL (ref 4.0–10.5)
nRBC: 0 % (ref 0.0–0.2)

## 2021-09-22 LAB — CBC WITH DIFFERENTIAL/PLATELET
Abs Immature Granulocytes: 0.07 10*3/uL (ref 0.00–0.07)
Basophils Absolute: 0 10*3/uL (ref 0.0–0.1)
Basophils Relative: 0 %
Eosinophils Absolute: 0.2 10*3/uL (ref 0.0–0.5)
Eosinophils Relative: 3 %
HCT: 33.2 % — ABNORMAL LOW (ref 36.0–46.0)
Hemoglobin: 9.6 g/dL — ABNORMAL LOW (ref 12.0–15.0)
Immature Granulocytes: 1 %
Lymphocytes Relative: 30 %
Lymphs Abs: 2.2 10*3/uL (ref 0.7–4.0)
MCH: 23.2 pg — ABNORMAL LOW (ref 26.0–34.0)
MCHC: 28.9 g/dL — ABNORMAL LOW (ref 30.0–36.0)
MCV: 80.4 fL (ref 80.0–100.0)
Monocytes Absolute: 0.7 10*3/uL (ref 0.1–1.0)
Monocytes Relative: 10 %
Neutro Abs: 4 10*3/uL (ref 1.7–7.7)
Neutrophils Relative %: 56 %
Platelets: 245 10*3/uL (ref 150–400)
RBC: 4.13 MIL/uL (ref 3.87–5.11)
RDW: 15.6 % — ABNORMAL HIGH (ref 11.5–15.5)
WBC: 7.1 10*3/uL (ref 4.0–10.5)
nRBC: 0 % (ref 0.0–0.2)

## 2021-09-22 MED ORDER — SODIUM CHLORIDE 0.9 % IV BOLUS
1000.0000 mL | Freq: Once | INTRAVENOUS | Status: AC
  Administered 2021-09-23: 1000 mL via INTRAVENOUS

## 2021-09-22 MED ORDER — SODIUM ZIRCONIUM CYCLOSILICATE 5 G PO PACK
10.0000 g | PACK | Freq: Once | ORAL | Status: AC
Start: 2021-09-22 — End: 2021-09-22
  Administered 2021-09-22: 10 g via ORAL
  Filled 2021-09-22: qty 2

## 2021-09-22 NOTE — Patient Instructions (Signed)
F/U in 5 weeks, call if you need me before  Stat CBC, chem 7 and EGFr at the hospital lab now  Blood sugar in office  Thanks for choosing [facility], we consider it a privelige to serve you.

## 2021-09-22 NOTE — Telephone Encounter (Signed)
Pt called and notified about her critical potasium level, patient denies CP , patient told to call 911 immediately for transportation to the ER for treatment . She verbalized understanding

## 2021-09-22 NOTE — ED Triage Notes (Signed)
Pt states she was told to come to ER by Dr Moshe Cipro due to labs being high. Pt has no complaints.

## 2021-09-22 NOTE — ED Provider Notes (Signed)
[facility] Emergency Department Provider Note MRN:  [id number]  Arrival date & time: 09/23/21     Chief Complaint   Abnormal Lab   History of Present Illness   [name] is a 71 y.o. year-old female with a history of diabetes, CKD presenting to the ED with chief complaint of abnormal lab.  Patient sent here for abnormal lab.  Recent admission for acute kidney injury.  Went to doctor for recheck of her labs and they were high again.  Patient feels generally weak, generally unwell, no other complaints.  Review of Systems  A thorough review of systems was obtained and all systems are negative except as noted in the HPI and PMH.   Patient's Health History    Past Medical History:  Diagnosis Date   Allergy    Anemia    Anemia in chronic kidney disease (CKD) 08/10/2021   Anxiety    takes Ativan daily   Arthritis    Assistance needed for mobility    Bipolar disorder ([place])    takes Risperdal nightly   Blood transfusion    Brain tumor ([place])    Cancer ([place])    In her gum   Carpal tunnel syndrome of right wrist 05/23/2011   Cervical disc disorder with radiculopathy of cervical region 10/31/2012   Chronic back pain    Chronic idiopathic constipation    Chronic neck and back pain    Colon polyps    COPD (chronic obstructive pulmonary disease) with chronic bronchitis ([place]) 09/16/2013   Office Spirometry 10/30/2013-submaximal effort based on appearance of loop and curve. Numbers would fit with severe restriction but her physiologic capability may be better than this. FVC 0.91/44%, and 10.74/45%, FEV1/FVC 0.81, FEF 25-75% 1.43/69%     Diabetes mellitus    Type II   Diverticulosis    TCS 9/08 by Dr. Delfin Edis for diarrhea . Bx for micro scopic colitis negative.    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   Metabolic encephalopathy 12/03/3005   Migraines    chronic headaches   Mononeuritis lower limb    Narcolepsy    Osteoporosis    Pancreatitis 2006   due to Depakote with normal EUS    Paralysis (HCC)    Schatzki's ring    non critical / EGD with ED 8/2011with RMR   Seizures ([place])    takes Lamictal daily.Last seizure 3 yrs ago   Sleep apnea    on CPAP   Small bowel obstruction (HCC)    Stroke ([place])    left sided weakness, speech changes   Tubular adenoma of colon     Past Surgical History:  Procedure Laterality Date   ABDOMINAL HYSTERECTOMY  1978   BACK SURGERY  July 2012   BACTERIAL OVERGROWTH TEST N/A 05/05/2013   Procedure: BACTERIAL OVERGROWTH TEST;  Surgeon: Daneil Dolin, MD;  [facility];  Service: Endoscopy;  Laterality: N/A;  7:30   BIOPSY THYROID  2009   BRAIN SURGERY  11/2011   resection of meningioma   BREAST REDUCTION SURGERY  1994   CARDIAC CATHETERIZATION  05/10/2005   normal coronaries, normal LV systolic function and EF (Dr. Jackie Plum)   [place] Left 07/22/04   Dr. Aline Brochure   CATARACT EXTRACTION Bilateral    CHOLECYSTECTOMY  1984   COLONOSCOPY N/A 09/25/2012   [id number] diverticulosis.  colonic polyp-removed : tubular adenoma   CRANIOTOMY  11/23/2011   Procedure: CRANIOTOMY TUMOR EXCISION;  Surgeon: Hosie Spangle, MD;  Location: [facility];  Service: Neurosurgery;  Laterality: N/A;  Craniotomy for tumor resection   ESOPHAGOGASTRODUODENOSCOPY  12/29/2010   [place]-Retained food in the esophagus and stomach, small hiatal hernia, status post Maloney dilation of the esophagus   ESOPHAGOGASTRODUODENOSCOPY N/A 09/25/2012    [id number] atonic baggy esophagus status post Maloney dilation 55 F. Hiatal hernia   GIVENS CAPSULE STUDY N/A 01/15/2013   NORMAL.    IR GENERIC HISTORICAL  03/17/2016   IR RADIOLOGIST EVAL & MGMT 03/17/2016 [facility]   LESION REMOVAL N/A 05/31/2015   Procedure: REMOVAL RIGHT AND LEFT LESIONS OF MANDIBLE;  Surgeon: Diona Browner, DDS;  Location: [facility];  Service: Oral Surgery;  Laterality: N/A;   MALONEY DILATION  12/29/2010   RMR;   NM MYOCAR PERF WALL MOTION  2006   "relavtiely normal" persantine, mild anterior thinning (breast attenuation artifact), no region of scar/ischemia   OVARIAN CYST REMOVAL     RECTOCELE REPAIR N/A 06/29/2015   Procedure: POSTERIOR REPAIR (RECTOCELE);  Surgeon: Jonnie Kind, MD;  Location: [facility];  Service: Gynecology;  Laterality: N/A;   REDUCTION MAMMAPLASTY Bilateral    SPINE SURGERY  09/29/2010   Dr. Rolena Infante   surgical excision of 3 tumors from right thigh and right buttock  and left upper thigh  2010   TOOTH EXTRACTION Bilateral 12/14/2014   Procedure: REMOVAL OF BILATERAL MANDIBULAR EXOSTOSES;  Surgeon: Diona Browner, DDS;  Location: [facility];  Service: Oral Surgery;  Laterality: Bilateral;   TRANSTHORACIC ECHOCARDIOGRAM  2010   EF 60-65%, mild conc LVH, grade 1 diastolic dysfunction; mildly calcified MV annulus with mildly thickened leaflets, mildly calcified MR annulus    Family History  Problem Relation Age of Onset   Heart attack Mother        HTN   Pneumonia Father    Kidney failure Father    Diabetes Father    Pancreatic cancer Sister    Cancer Sister        breast    Cancer Sister        pancreatic   Diabetes Brother    Hypertension Brother    Diabetes Brother    Alcohol abuse Maternal Uncle    Stroke Maternal Grandmother    Heart attack Maternal Grandfather    Hypertension Son    Sleep apnea Son    Colon cancer Neg Hx    Anesthesia problems Neg Hx    Hypotension Neg Hx    Malignant hyperthermia Neg Hx    Pseudochol deficiency Neg  Hx    Breast cancer Neg Hx    Stomach cancer Neg Hx     Social History   Socioeconomic History   Marital status: Divorced    Spouse name: Not on file   Number of children: 1   Years of education: 12   Highest education level: High school graduate  Occupational History   Occupation: Disabled  Tobacco Use   Smoking status: Former    Packs/day: 0.25    Years: 30.00    Total pack years: 7.50    Types: Cigarettes    Quit date: 07/05/2021    Years since quitting: 0.2    Passive exposure: Past   Smokeless tobacco: Never   Tobacco comments:    3-4 a day MRC 05/17/2021  Vaping Use   Vaping Use: Never used  Substance and Sexual Activity   Alcohol use: No    Alcohol/week: 0.0 standard drinks of alcohol    Comment:     Drug use: No   Sexual activity: Not Currently  Other Topics Concern   Not on file  Social History Narrative   01/29/18 Lives alone, has 3 aides, Mon- Fri 8 hrs, 2 hrs on Sat-Sun, RN manages her meds   Caffeine use: Drink coffee sometimes    Right handed    Social Determinants of Health   Financial Resource Strain: Low Risk  (07/11/2021)   Overall Financial Resource Strain (CARDIA)    Difficulty of Paying Living Expenses: Not very hard  Food Insecurity: No Food Insecurity (08/23/2021)   Hunger Vital Sign    Worried About Running Out of Food in the Last Year: Never true    Ran Out of Food in the Last Year: Never true  Transportation Needs: No Transportation Needs (08/23/2021)   PRAPARE - Hydrologist (Medical): No    Lack of Transportation (Non-Medical): No  Physical Activity: Inactive (07/11/2021)   Exercise Vital Sign    Days of Exercise per Week: 0 days    Minutes of Exercise per Session: 0 min  Stress: No Stress Concern Present (07/11/2021)   [place]    Feeling of Stress : Only a little  Social Connections: Moderately Isolated (07/11/2021)   Social Connection and  Isolation Panel [NHANES]    Frequency of Communication with Friends and Family: Three times a week    Frequency of Social Gatherings with Friends and Family: Twice a week    Attends Religious Services: More than 4 times per year    Active Member of Clubs or Organizations: No    Attends Archivist Meetings: Never    Marital Status: Divorced  Human resources officer Violence: Not At Risk (05/21/2019)   Humiliation, Afraid, Rape, and Kick questionnaire    Fear of Current or Ex-Partner: No    Emotionally Abused: No    Physically Abused: No    Sexually Abused: No     Physical Exam   Vitals:   09/22/21 2330 09/23/21 0115  BP: (!) 111/54 (!) 111/56  Pulse: (!) 59 (!) 59  Resp: 18 17  Temp:    SpO2: 94% 96%    CONSTITUTIONAL: Well-appearing, NAD NEURO/PSYCH:  Alert and oriented x 3, no focal deficits EYES:  eyes equal and reactive ENT/NECK:  no LAD, no JVD CARDIO: Regular rate, well-perfused, normal S1 and S2 PULM:  CTAB no wheezing or rhonchi GI/GU:  non-distended, non-tender MSK/SPINE:  No gross deformities, no edema SKIN:  no rash, atraumatic   *Additional and/or pertinent findings included in MDM below  Diagnostic and Interventional Summary    EKG Interpretation  Date/Time:  Thursday September 22 2021 23:07:17 EDT Ventricular Rate:  63 PR Interval:  206 QRS Duration: 95 QT Interval:  393 QTC Calculation: 403 R Axis:   43 Text Interpretation: Sinus rhythm Minimal ST elevation, inferior leads Confirmed by Sedonia Small,  Legrand Como [phone]) on 09/22/2021 11:36:31 PM       Labs Reviewed  CBC WITH DIFFERENTIAL/PLATELET - Abnormal; Notable for the following components:      Result Value   Hemoglobin 9.6 (*)    HCT 33.2 (*)    MCH 23.2 (*)    MCHC 28.9 (*)    RDW 15.6 (*)    All other components within normal limits  BASIC METABOLIC PANEL - Abnormal; Notable for the following components:   Potassium 5.7 (*)    Glucose, Bld 157 (*)    BUN 56 (*)    Creatinine, Ser 2.46 (*)     GFR, Estimated 20 (*)    All other components within normal limits  URINALYSIS, ROUTINE W REFLEX MICROSCOPIC    No orders to display    Medications  sodium chloride 0.9 % bolus 1,000 mL (0 mLs Intravenous Stopped 09/23/21 0156)  sodium zirconium cyclosilicate (LOKELMA) packet 10 g (10 g Oral Given 09/22/21 2346)     Procedures  /  Critical Care Procedures  ED Course and Medical Decision Making  Initial Impression and Ddx Weakness, malaise, had similar symptoms last week when she was admitted for AKI.  She was rehydrated and her increased creatinine went back down to 1.12.  She refused SNF placement and was adamant on going home.  She has chronic pain and I suspect that she has not been hydrating well.  During these few days at home her kidney function has worsened with creatinine of 2.4, hyperkalemia in the office 6.4.  The potassium here is 5.7.  No EKG changes.  Providing fluids, Lokelma.  Admission suggested but patient is hesitant, wants to go home.  We will rediscuss after medications.  Past medical/surgical history that increases complexity of ED encounter: Chronic back pain  Interpretation of Diagnostics I personally reviewed the EKG and my interpretation is as follows: Sinus rhythm    Patient Reassessment and Ultimate Disposition/Management     We discussed options of management again and patient agrees with admission.  Patient management required discussion with the following services or consulting groups:  Hospitalist Service  Complexity of Problems Addressed Acute illness or injury that poses threat of life of bodily function  Additional Data Reviewed and Analyzed Further history obtained from: None  Additional Factors Impacting ED Encounter Risk Consideration of hospitalization  Barth Kirks. Sedonia Small, [address] [email or web address]  Final Clinical Impressions(s) / ED Diagnoses     ICD-10-CM   1. AKI (acute kidney  injury) ([place])  N17.9     2. Hyperkalemia  E87.5       ED Discharge Orders     None        Discharge Instructions Discussed with and Provided to Patient:   Discharge Instructions   None      Maudie Flakes, MD 09/23/21 0201

## 2021-09-23 ENCOUNTER — Encounter (HOSPITAL_COMMUNITY): Payer: Self-pay | Admitting: Internal Medicine

## 2021-09-23 DIAGNOSIS — E876 Hypokalemia: Secondary | ICD-10-CM | POA: Diagnosis present

## 2021-09-23 DIAGNOSIS — E1165 Type 2 diabetes mellitus with hyperglycemia: Secondary | ICD-10-CM | POA: Diagnosis present

## 2021-09-23 DIAGNOSIS — K219 Gastro-esophageal reflux disease without esophagitis: Secondary | ICD-10-CM | POA: Diagnosis present

## 2021-09-23 DIAGNOSIS — K3184 Gastroparesis: Secondary | ICD-10-CM | POA: Diagnosis present

## 2021-09-23 DIAGNOSIS — D649 Anemia, unspecified: Secondary | ICD-10-CM | POA: Diagnosis present

## 2021-09-23 DIAGNOSIS — R569 Unspecified convulsions: Secondary | ICD-10-CM | POA: Diagnosis present

## 2021-09-23 DIAGNOSIS — I10 Essential (primary) hypertension: Secondary | ICD-10-CM | POA: Diagnosis not present

## 2021-09-23 DIAGNOSIS — E782 Mixed hyperlipidemia: Secondary | ICD-10-CM

## 2021-09-23 DIAGNOSIS — I129 Hypertensive chronic kidney disease with stage 1 through stage 4 chronic kidney disease, or unspecified chronic kidney disease: Secondary | ICD-10-CM | POA: Diagnosis present

## 2021-09-23 DIAGNOSIS — G47419 Narcolepsy without cataplexy: Secondary | ICD-10-CM | POA: Diagnosis present

## 2021-09-23 DIAGNOSIS — E1143 Type 2 diabetes mellitus with diabetic autonomic (poly)neuropathy: Secondary | ICD-10-CM | POA: Diagnosis present

## 2021-09-23 DIAGNOSIS — N179 Acute kidney failure, unspecified: Secondary | ICD-10-CM | POA: Diagnosis present

## 2021-09-23 DIAGNOSIS — K589 Irritable bowel syndrome without diarrhea: Secondary | ICD-10-CM | POA: Diagnosis present

## 2021-09-23 DIAGNOSIS — E039 Hypothyroidism, unspecified: Secondary | ICD-10-CM | POA: Diagnosis present

## 2021-09-23 DIAGNOSIS — G8929 Other chronic pain: Secondary | ICD-10-CM | POA: Diagnosis present

## 2021-09-23 DIAGNOSIS — M549 Dorsalgia, unspecified: Secondary | ICD-10-CM | POA: Diagnosis present

## 2021-09-23 DIAGNOSIS — E875 Hyperkalemia: Secondary | ICD-10-CM | POA: Diagnosis present

## 2021-09-23 DIAGNOSIS — M797 Fibromyalgia: Secondary | ICD-10-CM | POA: Diagnosis present

## 2021-09-23 DIAGNOSIS — E860 Dehydration: Secondary | ICD-10-CM | POA: Diagnosis present

## 2021-09-23 DIAGNOSIS — H409 Unspecified glaucoma: Secondary | ICD-10-CM | POA: Diagnosis present

## 2021-09-23 DIAGNOSIS — N1831 Chronic kidney disease, stage 3a: Secondary | ICD-10-CM | POA: Diagnosis present

## 2021-09-23 DIAGNOSIS — J449 Chronic obstructive pulmonary disease, unspecified: Secondary | ICD-10-CM | POA: Diagnosis present

## 2021-09-23 DIAGNOSIS — M810 Age-related osteoporosis without current pathological fracture: Secondary | ICD-10-CM | POA: Diagnosis present

## 2021-09-23 DIAGNOSIS — F319 Bipolar disorder, unspecified: Secondary | ICD-10-CM | POA: Diagnosis present

## 2021-09-23 DIAGNOSIS — E1122 Type 2 diabetes mellitus with diabetic chronic kidney disease: Secondary | ICD-10-CM | POA: Diagnosis present

## 2021-09-23 LAB — BLOOD GAS, VENOUS
Acid-base deficit: 1.3 mmol/L (ref 0.0–2.0)
Bicarbonate: 24.8 mmol/L (ref 20.0–28.0)
Drawn by: 53361
O2 Saturation: 83.8 %
Patient temperature: 36.8
pCO2, Ven: 46 mmHg (ref 44–60)
pH, Ven: 7.34 (ref 7.25–7.43)
pO2, Ven: 49 mmHg — ABNORMAL HIGH (ref 32–45)

## 2021-09-23 LAB — GLUCOSE, CAPILLARY
Glucose-Capillary: 115 mg/dL — ABNORMAL HIGH (ref 70–99)
Glucose-Capillary: 166 mg/dL — ABNORMAL HIGH (ref 70–99)
Glucose-Capillary: 103 mg/dL — ABNORMAL HIGH (ref 70–99)
Glucose-Capillary: 111 mg/dL — ABNORMAL HIGH (ref 70–99)

## 2021-09-23 LAB — BASIC METABOLIC PANEL
Anion gap: 5 (ref 5–15)
BUN: 46 mg/dL — ABNORMAL HIGH (ref 8–23)
CO2: 23 mmol/L (ref 22–32)
Calcium: 8.8 mg/dL — ABNORMAL LOW (ref 8.9–10.3)
Chloride: 111 mmol/L (ref 98–111)
Creatinine, Ser: 1.77 mg/dL — ABNORMAL HIGH (ref 0.44–1.00)
GFR, Estimated: 30 mL/min — ABNORMAL LOW (ref >60)
Glucose, Bld: 110 mg/dL — ABNORMAL HIGH (ref 70–99)
Potassium: 5.2 mmol/L — ABNORMAL HIGH (ref 3.5–5.1)
Sodium: 139 mmol/L (ref 135–145)

## 2021-09-23 LAB — RAPID URINE DRUG SCREEN, HOSP PERFORMED
Amphetamines: NOT DETECTED
Barbiturates: NOT DETECTED
Benzodiazepines: NOT DETECTED
Cocaine: NOT DETECTED
Opiates: NOT DETECTED
Tetrahydrocannabinol: NOT DETECTED

## 2021-09-23 MED ORDER — ACETAMINOPHEN 650 MG RE SUPP: 650.0000 mg | Freq: Four times a day (QID) | RECTAL | Status: DC | PRN

## 2021-09-23 MED ORDER — ALBUTEROL SULFATE (2.5 MG/3ML) 0.083% IN NEBU: 3.0000 mL | INHALATION_SOLUTION | Freq: Four times a day (QID) | RESPIRATORY_TRACT | Status: DC | PRN

## 2021-09-23 MED ORDER — EZETIMIBE 10 MG PO TABS
10.0000 mg | ORAL_TABLET | Freq: Every day | ORAL | Status: DC
  Administered 2021-09-23 – 2021-09-24 (×2): 10 mg via ORAL
  Filled 2021-09-23 (×2): qty 1

## 2021-09-23 MED ORDER — PANTOPRAZOLE SODIUM 40 MG PO TBEC
40.0000 mg | DELAYED_RELEASE_TABLET | Freq: Every day | ORAL | Status: DC
  Administered 2021-09-23 – 2021-09-24 (×2): 40 mg via ORAL
  Filled 2021-09-23 (×2): qty 1

## 2021-09-23 MED ORDER — INSULIN ASPART 100 UNIT/ML IJ SOLN
0.0000 [IU] | Freq: Three times a day (TID) | INTRAMUSCULAR | Status: DC
  Administered 2021-09-23: 2 [IU] via SUBCUTANEOUS

## 2021-09-23 MED ORDER — SODIUM CHLORIDE 0.9 % IV SOLN: INTRAVENOUS | Status: AC

## 2021-09-23 MED ORDER — MONTELUKAST SODIUM 10 MG PO TABS
10.0000 mg | ORAL_TABLET | Freq: Every day | ORAL | Status: DC
  Administered 2021-09-23 – 2021-09-24 (×2): 10 mg via ORAL
  Filled 2021-09-23 (×2): qty 1

## 2021-09-23 MED ORDER — INSULIN ASPART 100 UNIT/ML IV SOLN
5.0000 [IU] | Freq: Once | INTRAVENOUS | Status: AC
Start: 2021-09-23 — End: 2021-09-23
  Administered 2021-09-23: 5 [IU] via INTRAVENOUS

## 2021-09-23 MED ORDER — LEVOTHYROXINE SODIUM 50 MCG PO TABS
50.0000 ug | ORAL_TABLET | Freq: Every day | ORAL | Status: DC
  Administered 2021-09-23 – 2021-09-24 (×2): 50 ug via ORAL
  Filled 2021-09-23 (×2): qty 1

## 2021-09-23 MED ORDER — INSULIN ASPART 100 UNIT/ML IJ SOLN: 0.0000 [IU] | Freq: Every day | INTRAMUSCULAR | Status: DC

## 2021-09-23 MED ORDER — DEXTROSE 50 % IV SOLN
1.0000 | Freq: Once | INTRAVENOUS | Status: AC
Start: 2021-09-23 — End: 2021-09-23
  Administered 2021-09-23: 50 mL via INTRAVENOUS
  Filled 2021-09-23: qty 50

## 2021-09-23 MED ORDER — ACETAMINOPHEN 325 MG PO TABS
650.0000 mg | ORAL_TABLET | Freq: Four times a day (QID) | ORAL | Status: DC | PRN
  Administered 2021-09-23 – 2021-09-24 (×2): 650 mg via ORAL
  Filled 2021-09-23 (×2): qty 2

## 2021-09-23 MED ORDER — ENOXAPARIN SODIUM 30 MG/0.3ML IJ SOSY
30.0000 mg | PREFILLED_SYRINGE | INTRAMUSCULAR | Status: DC
  Administered 2021-09-23 – 2021-09-24 (×2): 30 mg via SUBCUTANEOUS
  Filled 2021-09-23 (×2): qty 0.3

## 2021-09-23 MED ORDER — SODIUM CHLORIDE 0.9 % IV BOLUS
500.0000 mL | Freq: Once | INTRAVENOUS | Status: AC
  Administered 2021-09-23: 500 mL via INTRAVENOUS

## 2021-09-23 NOTE — Evaluation (Signed)
Physical Therapy Evaluation Patient Details Name: [name] MRN: [id number] DOB: 11/14/1950 Today's Date: 09/23/2021  History of Present Illness  [name] is a 71 y.o. female with medical history significant of hypertension, hyperlipidemia, T2DM, hypothyroidism, COPD, CKD stage IIIa who presents to the emergency department due to abnormal lab.  Patient was admitted from 7/6 to 7/8 due to acute kidney injury secondary to dehydration, she was treated with IV fluid with improved creatinine to 1.2 from >3.  She followed up with PCP for repeat lab s/p discharge from the hospital and she was called by the doctor's office to go to the emergency department due to abnormal labs with elevated BUN/creatinine again.  She denies chest pain, shortness of breath, fever, chills,   Clinical Impression  Patient limited for functional mobility as stated below secondary to BLE weakness, fatigue and poor standing balance. Patient with slow, labored transition to seated EOB with HOB elevated. She demonstrates good sitting balance EOB. She requires HHA to transfer to standing and ambulate with slow steps to [facility] at bedside. Patient returns to bed at end of session. Patient will benefit from continued physical therapy in hospital and recommended venue below to increase strength, balance, endurance for safe ADLs and gait.        Recommendations for follow up therapy are one component of a multi-disciplinary discharge planning process, led by the attending physician.  Recommendations may be updated based on patient status, additional functional criteria and insurance authorization.  Follow Up Recommendations Skilled nursing-short term rehab (<3 hours/day) Can patient physically be transported by private vehicle: Yes    Assistance Recommended at Discharge Intermittent Supervision/Assistance  Patient can return home with the following  A lot of help with walking and/or transfers;A little help with  bathing/dressing/bathroom;Assistance with cooking/housework;Help with stairs or ramp for entrance    Equipment Recommendations None recommended by PT  Recommendations for Other Services       Functional Status Assessment Patient has had a recent decline in their functional status and demonstrates the ability to make significant improvements in function in a reasonable and predictable amount of time.     Precautions / Restrictions Precautions Precautions: Fall Restrictions Weight Bearing Restrictions: No      Mobility  Bed Mobility Overal bed mobility: Needs Assistance Bed Mobility: Supine to Sit, Sit to Supine     Supine to sit: Min guard, HOB elevated     General bed mobility comments: increased time, labored movement, required HOB raised    Transfers [name] transfer level: Needs assistance Equipment used: 1 person hand held assist Transfers: Sit to/from Stand, Bed to chair/wheelchair/BSC Sit to Stand: Min assist   Step pivot transfers: Min assist       General transfer comment: slow labored unsteady movement    Ambulation/Gait Ambulation/Gait assistance: Min assist Gait Distance (Feet): 4 Feet Assistive device: 1 person hand held assist Gait Pattern/deviations: Decreased step length - right, Decreased step length - left, Decreased stride length Gait velocity: decreased     General Gait Details: slow labored unsteady cadence to [facility]  Stairs            Wheelchair Mobility    Modified Rankin (Stroke Patients Only)       Balance [name] balance assessment: Needs assistance Sitting-balance support: Feet supported, No upper extremity supported Sitting balance-Leahy Scale: Fair Sitting balance - Comments: fair/good seated at EOB   Standing balance support: During functional activity, Bilateral upper extremity supported Standing balance-Leahy Scale: Poor  Pertinent Vitals/Pain Pain Assessment Pain  Assessment: Faces Faces Pain Scale: Hurts little more Pain Location: chronic left shoulder, left side of neck and low back Pain Descriptors / Indicators: Sore, Discomfort Pain Intervention(s): Limited activity within patient's tolerance, Monitored during session, Repositioned    Home Living Family/patient expects to be discharged to:: Private residence Living Arrangements: Alone Available Help at Discharge: Personal care attendant;Available PRN/intermittently;Family;Friend(s) Type of Home: Apartment Home Access: Level entry       Home Layout: One level Home Equipment: Conservation officer, nature (2 wheels);Cane - single point      Prior Function Prior Level of Function : Needs assist       Physical Assist : Mobility (physical);ADLs (physical) Mobility (physical): Bed mobility;Transfers;Gait;Stairs ADLs (physical): IADLs Mobility Comments: household ambulator using Rollator most of time, sometimes single point cane, uses electric carts in grocery stores ADLs Comments: home aides 8 hours/day x 5 days/week     Hand Dominance   Dominant Hand: Right    Extremity/Trunk Assessment   Upper Extremity Assessment Upper Extremity Assessment: Generalized weakness    Lower Extremity Assessment Lower Extremity Assessment: Generalized weakness    Cervical / Trunk Assessment Cervical / Trunk Assessment: Kyphotic  Communication   Communication: No difficulties  Cognition Arousal/Alertness: Awake/alert Behavior During Therapy: WFL for tasks assessed/performed [name] Cognitive Status: Within Functional Limits for tasks assessed                                          General Comments      Exercises     Assessment/Plan    PT Assessment Patient needs continued PT services  PT Problem List Decreased strength;Decreased activity tolerance;Decreased balance;Decreased mobility       PT Treatment Interventions DME instruction;Gait training;Stair training;Functional  mobility training;Therapeutic activities;Therapeutic exercise;Balance training;Patient/family education    PT Goals (Current goals can be found in the Care Plan section)  Acute Rehab PT Goals Patient Stated Goal: return home with home aides to assist PT Goal Formulation: With patient Time For Goal Achievement: 10/07/21 Potential to Achieve Goals: Good    Frequency Min 3X/week     Co-evaluation               AM-PAC PT "6 Clicks" Mobility  Outcome Measure Help needed turning from your back to your side while in a flat bed without using bedrails?: None Help needed moving from lying on your back to sitting on the side of a flat bed without using bedrails?: A Little Help needed moving to and from a bed to a chair (including a wheelchair)?: A Little Help needed standing up from a chair using your arms (e.g., wheelchair or bedside chair)?: A Little Help needed to walk in hospital room?: A Lot Help needed climbing 3-5 steps with a railing? : A Lot 6 Click Score: 17    End of Session   Activity Tolerance: Patient tolerated treatment well;Patient limited by fatigue Patient left: with call bell/phone within reach;with family/visitor present;in bed Nurse Communication: Mobility status PT Visit Diagnosis: Unsteadiness on feet (R26.81);Other abnormalities of gait and mobility (R26.89);Muscle weakness (generalized) (M62.81)    Time: 2878-6767 PT Time Calculation (min) (ACUTE ONLY): 20 min   Charges:   PT Evaluation $PT Eval Low Complexity: 1 Low PT Treatments $Therapeutic Activity: 8-22 mins        11:02 AM, 09/23/21 Mearl Latin PT, DPT Physical Therapist at [facility]  [facility]

## 2021-09-23 NOTE — Assessment & Plan Note (Signed)
Continue levothyroxine 

## 2021-09-23 NOTE — Assessment & Plan Note (Signed)
09/19/2021 hemoglobin A1c 5.5 At baseline, patient takes Toujeo 15 units at bedtime and NovoLog 10 units with small meals, 15 units with large meals Continue reduced dose Semglee

## 2021-09-23 NOTE — H&P (Signed)
History and Physical    Patient: [name] MKL:[id number] DOB: Oct 02, 1950 DOA: 09/22/2021 DOS: the patient was seen and examined on 09/23/2021 PCP: [name], MD  Patient coming from: Home  Chief Complaint:  Chief Complaint  Patient presents with   Abnormal Lab   HPI: [name] is a 71 y.o. female with medical history significant of hypertension, hyperlipidemia, T2DM, hypothyroidism, COPD, CKD stage IIIa who presents to the emergency department due to abnormal lab. Patient was admitted from 7/6 to 7/8 due to acute kidney injury secondary to dehydration, she was treated with IV fluid with improved creatinine to 1.2 from >3.  She followed up with PCP for repeat lab s/p discharge from the hospital and she was called by the doctor's office to go to the emergency department due to abnormal labs with elevated BUN/creatinine again.  She denies chest pain, shortness of breath, fever, chills,   ED Course:  In the emergency department, she was hemodynamically stable, BP was 107/50 and other vital signs were within normal range.  Work-up in the ED showed normocytic anemia, BMP was normal except for hypokalemia and BUN/creatinine 56/2.59 (this was 36/1.14 on discharge on 7/8), urinalysis was normal. IV hydration was provided, Lokelma was given.  Hospitalist was asked to admit patient for further evaluation and management.  Review of Systems: Review of systems as noted in the HPI. All other systems reviewed and are negative.   Past Medical History:  Diagnosis Date   Allergy    Anemia    Anemia in chronic kidney disease (CKD) 08/10/2021   Anxiety    takes Ativan daily   Arthritis    Assistance needed for mobility    Bipolar disorder ([place])    takes Risperdal nightly   Blood transfusion    Brain tumor ([place])    Cancer ([place])    In her gum   Carpal tunnel syndrome of right wrist 05/23/2011   Cervical disc disorder with radiculopathy of cervical region 10/31/2012   Chronic back  pain    Chronic idiopathic constipation    Chronic neck and back pain    Colon polyps    COPD (chronic obstructive pulmonary disease) with chronic bronchitis ([place]) 09/16/2013   Office Spirometry 10/30/2013-submaximal effort based on appearance of loop and curve. Numbers would fit with severe restriction but her physiologic capability may be better than this. FVC 0.91/44%, and 10.74/45%, FEV1/FVC 0.81, FEF 25-75% 1.43/69%     Diabetes mellitus    Type II   Diverticulosis    TCS 9/08 by Dr. Delfin Edis for diarrhea . Bx for micro scopic colitis negative.    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   Metabolic encephalopathy 65/53/7482   Migraines    chronic headaches   Mononeuritis lower limb    Narcolepsy    Osteoporosis    Pancreatitis 2006   due to Depakote with normal EUS    Paralysis ([place])  Schatzki's ring    non critical / EGD with ED 8/2011with RMR   Seizures ([place])    takes Lamictal daily.Last seizure 3 yrs ago   Sleep apnea    on CPAP   Small bowel obstruction (H[facility])    Stroke ([place])    left sided weakness, speech changes   Tubular adenoma of colon    Past Surgical History:  Procedure Laterality Date   ABDOMINAL HYSTERECTOMY  1978   BACK SURGERY  July 2012   BACTERIAL OVERGROWTH TEST N/A 05/05/2013   Procedure: BACTERIAL OVERGROWTH TEST;  Surgeon: Daneil Dolin, MD;  [facility];  Service: Endoscopy;  Laterality: N/A;  7:30   BIOPSY THYROID  2009   BRAIN SURGERY  11/2011   resection of  meningioma   BREAST REDUCTION SURGERY  1994   CARDIAC CATHETERIZATION  05/10/2005   normal coronaries, normal LV systolic function and EF (Dr. Jackie Plum)   [place] Left 07/22/04   Dr. Aline Brochure   CATARACT EXTRACTION Bilateral    CHOLECYSTECTOMY  1984   COLONOSCOPY N/A 09/25/2012   GEZ:MOQHUTM diverticulosis.  colonic polyp-removed : tubular adenoma   CRANIOTOMY  11/23/2011   Procedure: CRANIOTOMY TUMOR EXCISION;  Surgeon: Hosie Spangle, MD;  Location: [facility];  Service: Neurosurgery;  Laterality: N/A;  Craniotomy for tumor resection   ESOPHAGOGASTRODUODENOSCOPY  12/29/2010   [place]-Retained food in the esophagus and stomach, small hiatal hernia, status post Maloney dilation of the esophagus   ESOPHAGOGASTRODUODENOSCOPY N/A 09/25/2012   LYY:[id number] atonic baggy esophagus status post Maloney dilation 30 F. Hiatal hernia   GIVENS CAPSULE STUDY N/A 01/15/2013   NORMAL.    IR GENERIC HISTORICAL  03/17/2016   IR RADIOLOGIST EVAL & MGMT 03/17/2016 [facility]   LESION REMOVAL N/A 05/31/2015   Procedure: REMOVAL RIGHT AND LEFT LESIONS OF MANDIBLE;  Surgeon: Diona Browner, DDS;  Location: [facility];  Service: Oral Surgery;  Laterality: N/A;   MALONEY DILATION  12/29/2010   RMR;   NM MYOCAR PERF WALL MOTION  2006   "relavtiely normal" persantine, mild anterior thinning (breast attenuation artifact), no region of scar/ischemia   OVARIAN CYST REMOVAL     RECTOCELE REPAIR N/A 06/29/2015   Procedure: POSTERIOR REPAIR (RECTOCELE);  Surgeon: Jonnie Kind, MD;  Location: [facility];  Service: Gynecology;  Laterality: N/A;   REDUCTION MAMMAPLASTY Bilateral    SPINE SURGERY  09/29/2010   Dr. Rolena Infante   surgical excision of 3 tumors from right thigh and right buttock  and left upper thigh  2010   TOOTH EXTRACTION Bilateral 12/14/2014   Procedure: REMOVAL OF BILATERAL MANDIBULAR EXOSTOSES;  Surgeon: Diona Browner, DDS;  Location: [facility];  Service: Oral Surgery;  Laterality: Bilateral;   TRANSTHORACIC  ECHOCARDIOGRAM  2010   EF 60-65%, mild conc LVH, grade 1 diastolic dysfunction; mildly calcified MV annulus with mildly thickened leaflets, mildly calcified MR annulus    Social History:  reports that she quit smoking about 2 months ago. Her smoking use included cigarettes. She has a 7.50 pack-year smoking history. She has been exposed to tobacco smoke. She has never used smokeless tobacco. She reports that she does not drink alcohol and does not use drugs.   Allergies  Allergen Reactions   Iron Nausea And Vomiting    And itching And itching   Lac Bovis Rash    Doesn't agree with stomach.  Doesn't agree with stomach.  Doesn't agree with stomach.    Penicillins Hives    Has  patient had a PCN reaction causing immediate rash, facial/tongue/throat swelling, SOB or lightheadedness with hypotension: Yes Has patient had a PCN reaction causing severe rash involving mucus membranes or skin necrosis: No Has patient had a PCN reaction that required hospitalization No Has patient had a PCN reaction occurring within the last 10 years: No If all of the above answers are "NO", then may proceed with Cephalosporin use.  Other reaction(s): Other (see comments) Has patient had a PCN reaction causing immediate rash, facial/tongue/throat swelling, SOB or lightheadedness with hypotension: Yes Has patient had a PCN reaction causing severe rash involving mucus membranes or skin necrosis: No Has patient had a PCN reaction that required hospitalization No Has patient had a PCN reaction occurring within the last 10 years: No If all of the above answers are "NO", then may proceed with Cephalosporin use. Has patient had a PCN reaction causing immediate rash, facial/tongue/throat swelling, SOB or lightheadedness with hypotension: Yes Has patient had a PCN reaction causing severe rash involving mucus membranes or skin necrosis: No Has patient had a PCN reaction that required hospitalization No Has patient had a PCN  reaction occurring within the last 10 years: No If all of the above answers are "NO", then may proceed with Cephalosporin use. Has patient had a PCN reaction causing immediate rash, facial/tongue/throat swelling, SOB or lightheadedness with hypotension: Yes Has patient had a PCN reaction causing severe rash involving mucus membranes or skin necrosis: No Has patient had a PCN reaction that required hospitalization No Has patient had a PCN reaction occurring within the last 10 years: No If all of the above answers are "NO", then may proceed with Cephalosporin use. Has patient had a PCN reaction causing immediate rash, facial/tongue/throat swelling, SOB or lightheadedness with hypotension: Yes Has patient had a PCN reaction causing sever... (TRUNCATED)   Phenazopyridine Hives   Phenazopyridine Hcl Hives   Cephalexin Hives   Flonase [Fluticasone]     "It gave me ulcers in my nose"   Milk-Related Compounds Other (See Comments)    Doesn't agree with stomach.    Phenazopyridine Hcl Hives          Family History  Problem Relation Age of Onset   Heart attack Mother        HTN   Pneumonia Father    Kidney failure Father    Diabetes Father    Pancreatic cancer Sister    Cancer Sister        breast    Cancer Sister        pancreatic   Diabetes Brother    Hypertension Brother    Diabetes Brother    Alcohol abuse Maternal Uncle    Stroke Maternal Grandmother    Heart attack Maternal Grandfather    Hypertension Son    Sleep apnea Son    Colon cancer Neg Hx    Anesthesia problems Neg Hx    Hypotension Neg Hx    Malignant hyperthermia Neg Hx    Pseudochol deficiency Neg Hx    Breast cancer Neg Hx    Stomach cancer Neg Hx      Prior to Admission medications   Medication Sig Start Date End Date Taking? Authorizing Provider  ACCU-CHEK GUIDE test strip TEST BLOOD SUGAR FOUR TIMES DAILY  AND AS NEEDED 05/30/21   Philemon Kingdom, MD  Accu-Chek Softclix Lancets lancets TEST BLOOD SUGAR  THREE TIMES DAILY AS DIRECTED 03/28/21   Philemon Kingdom, MD  albuterol (VENTOLIN HFA) 108 (90 Base) MCG/ACT inhaler  INHALE 1 TO 2 PUFFS EVERY 6 HOURS AS NEEDED FOR WHEEZING, SHORTNESS OF BREATH Patient taking differently: Inhale 1-2 puffs into the lungs every 6 (six) hours as needed for wheezing or shortness of breath. 06/24/21   Deneise Lever, MD  Alcohol Swabs (DROPSAFE ALCOHOL PREP) 70 % PADS USE TO CLEAN FINGER PRIOR TO TESTING FOR BLOOD SUGAR  AS DIRECTED 05/20/21   Philemon Kingdom, MD  alendronate (FOSAMAX) 70 MG tablet TAKE 1 TABLET (70 MG TOTAL) BY MOUTH EVERY 7 (SEVEN) DAYS. TAKE WITH A FULL GLASS OF WATER ON AN EMPTY STOMACH. 02/04/21   [name], MD  amLODipine (NORVASC) 10 MG tablet TAKE 1 TABLET EVERY DAY Patient taking differently: Take 10 mg by mouth daily. 06/06/21   Imogene Burn, PA-C  ascorbic acid (VITAMIN C) 500 MG tablet Take 500 mg by mouth daily.    [provider]  aspirin EC 81 MG tablet Take 1 tablet (81 mg total) by mouth daily with breakfast. 12/08/18   Roxan Hockey, MD  Blood Glucose Calibration (ACCU-CHEK GUIDE CONTROL) LIQD USE AS DIRECTED 12/13/20   Philemon Kingdom, MD  blood glucose meter kit and supplies Dispense based on patient and insurance preference. Use up to four times daily as directed. (FOR ICD-10 E10.9, E11.9). 06/27/19   Philemon Kingdom, MD  buPROPion (WELLBUTRIN XL) 150 MG 24 hr tablet Take 1 tablet (150 mg total) by mouth every morning. 07/12/21 07/12/22  Cloria Spring, MD  Calcium Carb-Cholecalciferol (CALTRATE 600+D3 SOFT) 600-20 MG-MCG CHEW Chew 1 tablet by mouth daily at 12 noon.    [provider]  cetirizine (ZYRTEC) 10 MG tablet Take 1 tablet (10 mg total) by mouth daily. 04/25/21   Renee Rival, FNP  Cholecalciferol (D3 PO) Take 1 tablet by mouth daily.    [provider]  Clobetasol Prop Emollient Base (CLOBETASOL PROPIONATE E) 0.05 % emollient cream Apply to affected area qd Patient taking  differently: Apply 1 Application topically daily. 01/24/21   Lavonna Monarch, MD  Continuous Blood Gluc Sensor (FREESTYLE LIBRE 14 DAY SENSOR) MISC 1 each by Does not apply route every 14 (fourteen) days. Change every 2 weeks 06/24/19   Philemon Kingdom, MD  diclofenac Sodium (VOLTAREN) 1 % GEL APPLY 2 GRAMS TOPICALLY FOUR TIMES DAILY. Patient taking differently: Apply 2 g topically 4 (four) times daily. 06/20/21   Esterwood, Amy S, PA-C  Docusate Sodium (DSS) 100 MG CAPS Take 1 tablet by mouth in the morning and at bedtime.    [provider]  DROPLET PEN NEEDLES 31G X 8 MM MISC USE FOR INJECTING INSULIN 4 TIMES DAILY. 09/02/21   Philemon Kingdom, MD  ezetimibe (ZETIA) 10 MG tablet TAKE 1 TABLET EVERY DAY Patient taking differently: Take 10 mg by mouth daily. 06/22/21   [name], MD  insulin aspart (NOVOLOG FLEXPEN) 100 UNIT/ML FlexPen Inject 5 Units into the skin 3 (three) times daily with meals. Give if eats 50% or more of meal. 09/17/21   Johnson, Clanford L, MD  insulin glargine, 2 Unit Dial, (TOUJEO MAX SOLOSTAR) 300 UNIT/ML Solostar Pen Inject 15 Units into the skin at bedtime. 09/17/21   Johnson, Clanford L, MD  lamoTRIgine (LAMICTAL) 100 MG tablet Take 1 tablet (100 mg total) by mouth 2 (two) times daily. 07/12/21   Cloria Spring, MD  levothyroxine (SYNTHROID) 50 MCG tablet TAKE 1 TABLET EVERY DAY FOR 6 DAYS PER WEEK, 1/2 TABLET 1 DAY PER WEEK 07/07/21   Philemon Kingdom, MD  LORazepam (ATIVAN) 0.5 MG tablet Take 1 tablet (0.5 mg total) by mouth 2 (two) times daily. 07/12/21   Cloria Spring, MD  meclizine (ANTIVERT) 25 MG tablet Take 1 tablet (25 mg total) by mouth 3 (three) times daily as needed for dizziness. 04/28/20   Noreene Larsson, NP  metoprolol tartrate (LOPRESSOR) 50 MG tablet TAKE 1 TABLET TWICE DAILY (NEED MD APPOINTMENT) Patient taking differently: Take 50 mg by mouth 2 (two) times daily. 08/29/21   Strader, Fransisco Hertz, PA-C  mirabegron ER (MYRBETRIQ) 25 MG TB24 tablet  Take 1 tablet (25 mg total) by mouth daily. 03/21/21   [name], MD  Misc. Devices (MATTRESS PAD) MISC FIRM MATTRESS PAD for hospital bed x 1 02/22/21   [name], MD  montelukast (SINGULAIR) 10 MG tablet TAKE 1 TABLET EVERY DAY Patient taking differently: Take 10 mg by mouth at bedtime. 05/20/21   [name], MD  Multiple Vitamins-Minerals (MULTIVITAMIN GUMMIES ADULT PO) Take 1 tablet by mouth daily.    [provider]  nystatin (MYCOSTATIN/NYSTOP) powder APPLY TO AFFECTED AREA 4 TIMES DAILY. Patient taking differently: Apply 1 Application topically in the morning, at noon, in the evening, and at bedtime. 09/12/21   [name], MD  Omega-3 Fatty Acids (FISH OIL PO) Take 1 capsule by mouth daily.    [provider]  ondansetron (ZOFRAN) 4 MG tablet Take 1 tablet (4 mg total) by mouth every 8 (eight) hours as needed for nausea or vomiting. 07/19/21   Alvira Monday, FNP  oxyCODONE-acetaminophen (PERCOCET) 10-325 MG tablet Take 1 tablet by mouth every 8 (eight) hours as needed for pain. 11/01/20   [provider]  pregabalin (LYRICA) 75 MG capsule Take 1 capsule (75 mg total) by mouth daily. 10/20/16   Rexene Alberts, MD  RABEprazole (ACIPHEX) 20 MG tablet Take 1 tablet (20 mg total) by mouth 2 (two) times daily. 06/29/21   Pyrtle, Lajuan Lines, MD  RESTASIS 0.05 % ophthalmic emulsion Place 2 drops into both eyes daily. 07/29/20   [provider]  risperiDONE (RISPERDAL) 0.5 MG tablet Take 1 tablet (0.5 mg total) by mouth at bedtime. 07/12/21   Cloria Spring, MD  sertraline (ZOLOFT) 100 MG tablet Take 1 tablet (100 mg total) by mouth every morning. 07/12/21   Cloria Spring, MD  STIOLTO RESPIMAT 2.5-2.5 MCG/ACT AERS INHALE 2 PUFFS INTO THE LUNGS DAILY. Patient taking differently: Inhale 2 puffs into the lungs daily. 05/31/20   Parrett, Fonnie Mu, NP  traZODone (DESYREL) 150 MG tablet Take 1 tablet (150 mg total) by mouth at bedtime. 07/12/21   Cloria Spring, MD    Physical Exam: BP (!) 156/64 (BP Location: Left Arm)   Pulse 74   Temp 97.8 F (36.6 C)   Resp 18   Ht 4' 11"  (1.499 m)   Wt 74.4 kg   SpO2 100%   BMI 33.12 kg/m   General: 71 y.o. year-old female well developed well nourished in no acute distress.  Alert and oriented x3. HEENT: NCAT, EOMI, dry mucous membranes Neck: Supple, trachea medial Cardiovascular: Regular rate and rhythm with no rubs or gallops.  No thyromegaly or JVD noted.  No lower extremity edema. 2/4 pulses in all 4 extremities. Respiratory: Clear to auscultation with no wheezes or rales. Good inspiratory effort. Abdomen: Soft, nontender nondistended with normal bowel sounds x4 quadrants. Muskuloskeletal: No cyanosis, clubbing or edema noted bilaterally Neuro: CN II-XII intact, strength 5/5 x 4, sensation, reflexes  intact Skin: No ulcerative lesions noted or rashes Psychiatry: Mood is appropriate for condition and setting          Labs on Admission:  Basic Metabolic Panel: Recent Labs  Lab 09/17/21 0527 09/22/21 1639 09/22/21 1951  NA 142 137 136  K 4.9 6.4* 5.7*  CL 113* 108 108  CO2 25 25 23   GLUCOSE 101* 88 157*  BUN 36* 56* 56*  CREATININE 1.14* 2.59* 2.46*  CALCIUM 8.7* 9.7 9.5  MG 2.2  --   --   PHOS 2.9  --   --    Liver Function Tests: Recent Labs  Lab 09/17/21 0527 09/22/21 1639  AST  --  22  ALT  --  31  ALKPHOS  --  65  BILITOT  --  0.4  PROT  --  6.9  ALBUMIN 3.5 4.0   No results for input(s): "LIPASE", "AMYLASE" in the last 168 hours. No results for input(s): "AMMONIA" in the last 168 hours. CBC: Recent Labs  Lab 09/17/21 0527 09/22/21 1639 09/22/21 1951  WBC 5.5 7.3 7.1  NEUTROABS  --   --  4.0  HGB 10.0* 9.2* 9.6*  HCT 34.5* 32.5* 33.2*  MCV 78.6* 80.0 80.4  PLT 209 239 245   Cardiac Enzymes: No results for input(s): "CKTOTAL", "CKMB", "CKMBINDEX", "TROPONINI" in the last 168 hours.  BNP (last 3 results) No results for input(s): "BNP" in the last  8760 hours.  ProBNP (last 3 results) No results for input(s): "PROBNP" in the last 8760 hours.  CBG: Recent Labs  Lab 09/16/21 1135 09/16/21 1620 09/16/21 1958 09/17/21 0716 09/17/21 1121  GLUCAP 128* 190* 101* 148* 89    Radiological Exams on Admission: No results found.  EKG: I independently viewed the EKG done and my findings are as followed: Normal sinus rhythm at a rate of 63 bpm  Assessment/Plan Present on Admission:  GERD  Hyperkalemia  Essential hypertension  Well controlled type 2 diabetes mellitus with gastroparesis ([place])  Principal Problem:   Acute renal failure superimposed on stage 3a chronic kidney disease ([place]) Active Problems:   Mixed hyperlipidemia   Essential hypertension   GERD   Well controlled type 2 diabetes mellitus with gastroparesis ([place])   Acquired hypothyroidism   Hyperkalemia   Dehydration  Acute kidney injury on CKD 3A BUN/creatinine 56/2.59 (this was 36/1.14 on discharge on 7/8) Gentle hydration Renally adjust medications, avoid nephrotoxic agents/dehydration/hypotension Continue to monitor BMP  Hyperkalemia K+ 6.4 > 5.7; no EKG changes Lokelma was already given Continue IV NS 500 mL Continue insulin 5 units/D50 per hyperkalemia order set Continue to monitor K+ level with serial labs  Dehydration Continue IV hydration  Acquired hypothyroidism Continue Synthroid  T2DM with hyperglycemia Continue ISS and hypoglycemia protocol  Essential hypertension BP meds will be held at this time due to soft BP  Mixed hyperlipidemia Continue Zetia  COPD Continue albuterol, Singulair  GERD Continue Protonix  DVT prophylaxis: Lovenox  Code Status: Full code  Consults: None  Family Communication: None at bedside  Severity of Illness: The appropriate patient status for this patient is OBSERVATION. Observation status is judged to be reasonable and necessary in order to provide the required intensity of service to ensure the  patient's safety. The patient's presenting symptoms, physical exam findings, and initial radiographic and laboratory data in the context of their medical condition is felt to place them at decreased risk for further clinical deterioration. Furthermore, it is anticipated that the patient will be medically stable for  discharge from the hospital within 2 midnights of admission.   Author: Bernadette Hoit, DO 09/23/2021 7:00 AM  For on call review [email or web address].

## 2021-09-23 NOTE — Assessment & Plan Note (Signed)
Continue Zetia. °

## 2021-09-23 NOTE — Assessment & Plan Note (Signed)
Continue pantoprazole. °

## 2021-09-23 NOTE — Assessment & Plan Note (Signed)
Patient given Lokelma at 2345 Repeat BMP Remain on telemetry Personally reviewed EKG--sinus rhythm, J-point elevation unchanged vs 09/15/2021

## 2021-09-23 NOTE — Hospital Course (Signed)
71 year old female with a history of anemia of chronic disease, bipolar disorder, COPD, hypertension, diabetes mellitus type 2, CKD stage III, chronic back pain, hyperlipidemia presenting to the ED secondary to abnormal labs and generalized weakness.  Notably, the patient was recently admitted to the hospital from 09/16/2018 3-70 23 for generalized weakness.  She was treated for acute on chronic renal failure.  She was fluid resuscitated with improvement of her serum creatinine from 3.11-1.14 on the day of discharge.  She refused SNF placement. After discharge, the patient had routine blood work done on 09/22/2021 which showed her serum creatinine elevated up to 2.46.  As result, the patient was advised to come to the emergency department for further evaluation and treatment.  The patient denies any fevers, chills, chest pain, shortness breath, nausea, vomiting, direct abdominal pain.  She denies any new medications since discharge.3 In the ED, the patient was afebrile and hemodynamically stable with oxygen saturation 100% room air.  BMP showed sodium 136, potassium 5.7, bicarbonate 23, serum creatinine 2.46.  LFTs were unremarkable.  WBC 7.1, hemoglobin 9.6, platelet 245,000.  The patient was given 1.5 L bolus and started on normal saline.

## 2021-09-23 NOTE — Progress Notes (Signed)
PROGRESS NOTE  [name] TOI:[id number] DOB: 1951-01-24 DOA: 09/22/2021 PCP: [name], MD  Brief History:  71 year old female with a history of anemia of chronic disease, bipolar disorder, COPD, hypertension, diabetes mellitus type 2, CKD stage III, chronic back pain, hyperlipidemia presenting to the ED secondary to abnormal labs and generalized weakness.  Notably, the patient was recently admitted to the hospital from 09/16/2018 3-70 23 for generalized weakness.  She was treated for acute on chronic renal failure.  She was fluid resuscitated with improvement of her serum creatinine from 3.11-1.14 on the day of discharge.  She refused SNF placement. After discharge, the patient had routine blood work done on 09/22/2021 which showed her serum creatinine elevated up to 2.46.  As result, the patient was advised to come to the emergency department for further evaluation and treatment.  The patient denies any fevers, chills, chest pain, shortness breath, nausea, vomiting, direct abdominal pain.  She denies any new medications since discharge.3 In the ED, the patient was afebrile and hemodynamically stable with oxygen saturation 100% room air.  BMP showed sodium 136, potassium 5.7, bicarbonate 23, serum creatinine 2.46.  LFTs were unremarkable.  WBC 7.1, hemoglobin 9.6, platelet 245,000.  The patient was given 1.5 L bolus and started on normal saline.     Assessment and Plan: * Acute renal failure superimposed on stage 3a chronic kidney disease (HCC) Baseline creatinine 1.1-1.2 Secondary to volume depletion Patient presented with serum creatinine 2.46 Continue IV fluids 05/06/2021 renal ultrasound--diffuse renal cortical thinning and increased echogenicity bilateral.  Hyperkalemia Patient given Lokelma at 2345 Repeat BMP Remain on telemetry Personally reviewed EKG--sinus rhythm, J-point elevation unchanged vs 09/15/2021  COPD mixed type ([place]) Stable on room air Continue  Singulair  Acquired hypothyroidism Continue levothyroxine  Well controlled type 2 diabetes mellitus with gastroparesis ([place]) 09/19/2021 hemoglobin A1c 5.5 At baseline, patient takes Toujeo 15 units at bedtime and NovoLog 10 units with small meals, 15 units with large meals Continue reduced dose Semglee  GERD Continue pantoprazole  Essential hypertension Restart amlodipine  Mixed hyperlipidemia Continue Zetia          Family Communication:  no Family at bedside  Consultants:  none  Code Status:  FULL   DVT Prophylaxis:   [place] Lovenox   Procedures: As Listed in Progress Note Above  Antibiotics: None  Total time spent 50 minutes.  Greater than 50% spent face to face counseling and coordinating care.    Subjective: Patient denies fevers, chills, headache, chest pain, dyspnea, nausea, vomiting, diarrhea, abdominal pain, dysuria, hematuria, hematochezia, and melena.   Objective: Vitals:   09/23/21 0200 09/23/21 0230 09/23/21 0251 09/23/21 0311  BP: (!) 156/62 (!) 108/57  (!) 156/64  Pulse: 67 66 64 74  Resp: '18 17 14 18  '$ Temp:   97.6 F (36.4 C) 97.8 F (36.6 C)  TempSrc:      SpO2: 96% 97% 98% 100%  Weight:      Height:        Intake/Output Summary (Last 24 hours) at 09/23/2021 8338 Last data filed at 09/23/2021 0622 Gross per 24 hour  Intake 1748.25 ml  Output --  Net 1748.25 ml   Weight change:  Exam:  General:  Pt is alert, follows commands appropriately, not in acute distress HEENT: No icterus, No thrush, No neck mass, /AT Cardiovascular: RRR, S1/S2, no rubs, no gallops Respiratory: CTA bilaterally, no wheezing, no crackles, no rhonchi Abdomen: Soft/+BS, non  tender, non distended, no guarding Extremities: No edema, No lymphangitis, No petechiae, No rashes, no synovitis   Data Reviewed: I have personally reviewed following labs and imaging studies Basic Metabolic Panel: Recent Labs  Lab 09/17/21 0527 09/22/21 1639 09/22/21 1951  NA  142 137 136  K 4.9 6.4* 5.7*  CL 113* 108 108  CO2 '25 25 23  '$ GLUCOSE 101* 88 157*  BUN 36* 56* 56*  CREATININE 1.14* 2.59* 2.46*  CALCIUM 8.7* 9.7 9.5  MG 2.2  --   --   PHOS 2.9  --   --    Liver Function Tests: Recent Labs  Lab 09/17/21 0527 09/22/21 1639  AST  --  22  ALT  --  31  ALKPHOS  --  65  BILITOT  --  0.4  PROT  --  6.9  ALBUMIN 3.5 4.0   No results for input(s): "LIPASE", "AMYLASE" in the last 168 hours. No results for input(s): "AMMONIA" in the last 168 hours. Coagulation Profile: No results for input(s): "INR", "PROTIME" in the last 168 hours. CBC: Recent Labs  Lab 09/17/21 0527 09/22/21 1639 09/22/21 1951  WBC 5.5 7.3 7.1  NEUTROABS  --   --  4.0  HGB 10.0* 9.2* 9.6*  HCT 34.5* 32.5* 33.2*  MCV 78.6* 80.0 80.4  PLT 209 239 245   Cardiac Enzymes: No results for input(s): "CKTOTAL", "CKMB", "CKMBINDEX", "TROPONINI" in the last 168 hours. BNP: Invalid input(s): "POCBNP" CBG: Recent Labs  Lab 09/16/21 1135 09/16/21 1620 09/16/21 1958 09/17/21 0716 09/17/21 1121  GLUCAP 128* 190* 101* 148* 89   HbA1C: No results for input(s): "HGBA1C" in the last 72 hours. Urine analysis:    Component Value Date/Time   COLORURINE YELLOW 09/22/2021 2306   APPEARANCEUR CLEAR 09/22/2021 2306   LABSPEC 1.013 09/22/2021 2306   PHURINE 5.0 09/22/2021 2306   GLUCOSEU NEGATIVE 09/22/2021 2306   HGBUR NEGATIVE 09/22/2021 2306   HGBUR negative 03/29/2010 0834   BILIRUBINUR NEGATIVE 09/22/2021 2306   BILIRUBINUR negative 08/31/2021 1434   BILIRUBINUR negative 10/08/2017 1143   KETONESUR NEGATIVE 09/22/2021 2306   PROTEINUR NEGATIVE 09/22/2021 2306   UROBILINOGEN 0.2 08/31/2021 1434   UROBILINOGEN 0.2 06/02/2014 0140   NITRITE NEGATIVE 09/22/2021 2306   LEUKOCYTESUR NEGATIVE 09/22/2021 2306   Sepsis Labs: '@LABRCNTIP'$ (procalcitonin:4,lacticidven:4) ) Recent Results (from the past 240 hour(s))  Urine Culture     Status: Abnormal   Collection Time: 09/16/21   6:03 AM   Specimen: Urine, Clean Catch  Result Value Ref Range Status   Specimen Description   Final    URINE, CLEAN CATCH Performed at [facility], [address] [place], [place] [address]    Special Requests   Final    NONE Performed at [facility], [address], [place], [place] [address]    Culture (A)  Final    <10,000 COLONIES/mL INSIGNIFICANT GROWTH Performed at [facility], [place] [address], [place], [place] [address]    Report Status 09/18/2021 FINAL  Final  MRSA Next Gen by PCR, Nasal     Status: None   Collection Time: 09/16/21  6:07 AM   Specimen: Nasal Mucosa; Nasal Swab  Result Value Ref Range Status   MRSA by PCR Next Gen NOT DETECTED NOT DETECTED Final    Comment: (NOTE) The GeneXpert MRSA Assay (FDA approved for NASAL specimens only), is one component of a comprehensive MRSA colonization surveillance program. It is not intended to diagnose MRSA infection nor to guide or monitor treatment  for MRSA infections. Test performance is not FDA approved in patients less than 49 years old. Performed at [facility], [address] [place], [place] [address]      Scheduled Meds:  insulin aspart  5 Units Intravenous Once   And   dextrose  1 ampule Intravenous Once   enoxaparin (LOVENOX) injection  30 mg Subcutaneous Q24H   ezetimibe  10 mg Oral Daily   insulin aspart  0-5 Units Subcutaneous QHS   insulin aspart  0-9 Units Subcutaneous TID WC   levothyroxine  50 mcg Oral Q0600   montelukast  10 mg Oral Daily   pantoprazole  40 mg Oral Daily   Continuous Infusions:  sodium chloride 100 mL/hr at 09/23/21 0622   sodium chloride      Procedures/Studies: CT Cervical Spine Wo Contrast  Result Date: 09/15/2021 CLINICAL DATA:  Status post fall due to weakness. EXAM: CT CERVICAL SPINE WITHOUT CONTRAST TECHNIQUE: Multidetector CT imaging of the cervical spine was performed without intravenous contrast. Multiplanar CT image reconstructions were also generated.  RADIATION DOSE REDUCTION: This exam was performed according to the departmental dose-optimization program which includes automated exposure control, adjustment of the mA and/or kV according to patient size and/or use of iterative reconstruction technique. COMPARISON:  October 16, 2020 FINDINGS: Alignment: There is straightening of the normal cervical spine lordosis. Skull base and vertebrae: No acute fracture. No primary bone lesion or focal pathologic process. Soft tissues and spinal canal: No prevertebral fluid or swelling. No visible canal hematoma. Disc levels: Moderate to marked severity endplate sclerosis and moderate severity anterior osteophyte formation are seen at the levels of C4-C5, C5-C6, C6-C7 and C7-T1. There is moderate severity narrowing of the anterior atlantoaxial articulation. Moderate to marked severity intervertebral disc space narrowing is seen at C5-C6, C6-C7 and C7-T1. Mild, bilateral multilevel facet joint hypertrophy is noted. Upper chest: Negative. Other: None. IMPRESSION: 1. No acute fracture or subluxation of the cervical spine. 2. Moderate to marked severity multilevel degenerative changes, as described above. Electronically Signed   By: Virgina Norfolk M.D.   On: 09/15/2021 22:26   CT Head Wo Contrast  Result Date: 09/15/2021 CLINICAL DATA:  Status post fall due to weakness. EXAM: CT HEAD WITHOUT CONTRAST TECHNIQUE: Contiguous axial images were obtained from the base of the skull through the vertex without intravenous contrast. RADIATION DOSE REDUCTION: This exam was performed according to the departmental dose-optimization program which includes automated exposure control, adjustment of the mA and/or kV according to patient size and/or use of iterative reconstruction technique. COMPARISON:  November 24, 2020 FINDINGS: Brain: There is mild cerebral atrophy with widening of the extra-axial spaces and ventricular dilatation. There are areas of decreased attenuation within the white  matter tracts of the supratentorial brain, consistent with microvascular disease changes. Stable areas of bifrontal encephalomalacia are noted. A small amount of residual meningioma is again noted within the planum sphenoidale. Vascular: Bilateral cavernous carotid artery calcification is present. Skull: A midline frontal craniotomy defect is seen. Sinuses/Orbits: No acute finding. Other: None. IMPRESSION: 1. Generalized cerebral atrophy and chronic white matter small vessel ischemic changes, without an acute intracranial abnormality. 2. 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 3. Stable small amount of residual meningioma within the planum sphenoidale. Electronically Signed   By: Virgina Norfolk M.D.   On: 09/15/2021 22:22    Orson Eva, DO  [facility]  If 7PM-7AM, please contact night-coverage [email or web address] Password [email or web address] 09/23/2021, 7:02 AM   LOS: 0 days

## 2021-09-23 NOTE — TOC Initial Note (Signed)
Transition of Care [facility]) - Initial/Assessment Note    Patient Details  Name: [name] MRN: [id number] Date of Birth: 23-Aug-1950  Transition of Care [facility]) CM/SW Contact:    Boneta Lucks, RN Phone Number: 09/23/2021, 11:10 AM  Clinical Narrative:   Patient admitted with acute Renal Failure. Patient has a high risk for readmission. Patient lives in an apartment. She has an aide 5 days a week, 8 hours a day. Has cane, Walker, bedside commode. PT is recommending SNF. Patient states she has equipment and help at home. She wants to go home.  Her PCP just set up PT with [facility], they have not made the first visit. She wishes to have HHPT.                 Expected Discharge Plan: [place] Barriers to Discharge: Continued Medical Work up   Patient Goals and CMS Choice Patient states their goals for this hospitalization and ongoing recovery are:: Return home with [facility]. CMS Medicare.gov Compare Post Acute Care list provided to:: Patient Choice offered to / list presented to : Patient  Expected Discharge Plan and Services Expected Discharge Plan: [place] In-house Referral: Clinical Social Work Discharge Planning Services: CM Consult Post Acute Care Choice: [place] arrangements for the past 2 months: Apartment                    Prior Living Arrangements/Services Living arrangements for the past 2 months: Apartment Lives with:: Self, Other (Comment) (Aide)   Do you feel safe going back to the place where you live?: Yes      Need for Family Participation in Patient Care: Yes (Comment) Care giver support system in place?: Yes (comment)   Criminal Activity/Legal Involvement Pertinent to Current Situation/Hospitalization: No - Comment as needed  Activities of Daily Living Home Assistive Devices/Equipment: Environmental consultant (specify type) (rollator) ADL Screening (condition at time of admission) Patient's cognitive ability adequate to safely  complete daily activities?: Yes Is the patient deaf or have difficulty hearing?: No Does the patient have difficulty seeing, even when wearing glasses/contacts?: No Does the patient have difficulty concentrating, remembering, or making decisions?: No Patient able to express need for assistance with ADLs?: Yes Does the patient have difficulty dressing or bathing?: Yes Independently performs ADLs?: No Communication: Needs assistance Is this a change from baseline?: Pre-admission baseline Dressing (OT): Needs assistance Is this a change from baseline?: Pre-admission baseline Grooming: Needs assistance Is this a change from baseline?: Pre-admission baseline Feeding: Needs assistance Is this a change from baseline?: Pre-admission baseline Bathing: Needs assistance Is this a change from baseline?: Pre-admission baseline Toileting: Needs assistance Is this a change from baseline?: Pre-admission baseline In/Out Bed: Needs assistance Is this a change from baseline?: Pre-admission baseline Walks in Home: Dependent Is this a change from baseline?: Pre-admission baseline Does the patient have difficulty walking or climbing stairs?: Yes Weakness of Legs: Both Weakness of Arms/Hands: None  Permission Sought/Granted      Emotional Assessment   Attitude/Demeanor/Rapport: Engaged Affect (typically observed): Accepting Orientation: : Oriented to Self, Oriented to Place, Oriented to  Time, Oriented to Situation Alcohol / Substance Use: Not Applicable Psych Involvement: No (comment)  Admission diagnosis:  Hyperkalemia [E87.5] AKI (acute kidney injury) ([place]) [N17.9] Acute kidney injury [facility]) [N17.9] Patient Active Problem List   Diagnosis Date Noted   Acute renal failure superimposed on stage 3a chronic kidney disease ([place]) 09/23/2021   AKI (acute kidney injury) ([place])  09/16/2021   CHF (congestive heart failure) ([place]) 09/16/2021   Abnormal EKG 09/16/2021   Urinary frequency 09/01/2021    Anemia in chronic kidney disease (CKD) 08/10/2021   Generalized pain 08/04/2021   Sciatica, right side 07/19/2021   Breast pain, left 07/10/2021   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[place]) 07/10/2021   Atherosclerosis of aorta (HCC) 07/10/2021   Generalized weakness 05/12/2021   Advanced care planning/counseling discussion 12/23/2020   Head trauma, initial encounter 12/20/2020   Trigger finger, right 12/12/2020   Confusion 11/24/2020   Blurry vision 11/18/2020   Cervical radiculopathy 07/09/2020   Closed fracture of lateral malleolus of right fibula 02/27/2020   Headache 02/19/2020   Tubular adenoma of colon 02/09/2019   Benign neoplasm of cerebral meninges (HCC) 11/12/2018   Benign meningioma of brain ([place]) 10/31/2018   Osteoarthritis of both hips 07/06/2018   Lipoma of extremity 12/31/2017   Impingement syndrome of left shoulder region 12/27/2017   Leg weakness, bilateral 10/28/2017   Lumbar spondylosis with myelopathy 10/12/2017   Numbness of hand 10/10/2017   Chronic neck pain 07/25/2017   Chronic pain syndrome 07/25/2017   At risk for cardiovascular event 06/10/2017   Diabetic polyneuropathy associated with type 2 diabetes mellitus ([place]) 05/19/2016   Tobacco user 04/24/2016   Obesity (BMI 30.0-34.9) 04/24/2016   History of palpitations 08/09/2015   Labile hypertension 08/03/2015   Normal coronary arteries 08/03/2015   Dizziness 07/15/2015   Left-sided low back pain with left-sided sciatica 06/27/2015   Multinodular goiter 05/06/2015   Rectocele, female 04/27/2015   Anal sphincter incontinence 04/27/2015   Pelvic relaxation due to rectocele 03/30/2015   Hyperkalemia 02/22/2015   Pulmonary hypertension ([place]) 02/22/2015   Posterior chest pain 02/21/2015   Episodic cigarette smoking dependence 01/11/2015   Migraine without aura and without status migrainosus, not intractable 07/02/2014   Flatulence 02/18/2014    Microcytic anemia 02/18/2014   Acquired hypothyroidism 08/16/2013   Gastroparesis 04/28/2013   Nicotine dependence 03/09/2013   Seizure disorder ([place]) 01/19/2013   Displacement of cervical intervertebral disc without myelopathy 12/13/2012   Bursitis of shoulder 12/13/2012   UTI (urinary tract infection) 12/01/2012   Cervical disc disorder with radiculopathy of cervical region 10/31/2012   Solitary pulmonary nodule 08/19/2012   Anemia 07/05/2012   Hypersomnia disorder related to a known organic factor 06/11/2012   Pruritus 04/18/2012   Meningioma ([place]) 11/19/2011   Mononeuritis leg 10/25/2011   Carpal tunnel syndrome of right wrist 05/23/2011   Chronic pain of right hand 05/04/2011   Polypharmacy 04/28/2011   Mood disorder ([place]) 04/28/2011   Constipation 04/13/2011   Recurrent falls 12/12/2010   Oropharyngeal dysphagia 07/12/2010   Urinary incontinence 12/16/2009   HEARING LOSS 10/26/2009   Well controlled type 2 diabetes mellitus with gastroparesis ([place]) 07/07/2009   Mixed hyperlipidemia 12/11/2008   IBS 12/11/2008   GERD 07/29/2008   MILK PRODUCTS ALLERGY 07/29/2008   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/03/2007   Essential hypertension 06/27/2007   Backache 06/19/2007   Osteoporosis 06/19/2007   Obstructive sleep apnea 06/19/2007   TRIGGER FINGER 04/18/2007   DIVERTICULOSIS, COLON 11/13/2006   PCP:  [name], MD Pharmacy:   [facility] - [place], [address] [place] [address] Phone: [phone] Fax: [phone] - [place], [place] [address] [place] [address] Phone: [phone] Fax: [phone]   Readmission  Risk Interventions    09/16/2021   10:07 AM  Readmission Risk Prevention Plan  Transportation Screening Complete  Medication Review Press photographer) Complete  HRI or Home Care Consult Complete  SW Recovery  Care/Counseling Consult Complete  Palliative Care Screening Not [place] Patient Refused

## 2021-09-23 NOTE — Plan of Care (Signed)
  Problem: Acute Rehab PT Goals(only PT should resolve) Goal: Patient Will Transfer Sit To/From Stand Outcome: Progressing Flowsheets (Taken 09/23/2021 1103) Patient will transfer sit to/from stand:  with modified independence  with supervision Goal: Pt Will Transfer Bed To Chair/Chair To Bed Outcome: Progressing Flowsheets (Taken 09/23/2021 1103) Pt will Transfer Bed to Chair/Chair to Bed:  with modified independence  with supervision Goal: Pt Will Ambulate Outcome: Progressing Flowsheets (Taken 09/23/2021 1103) Pt will Ambulate:  25 feet  with min guard assist  with supervision  with least restrictive assistive device Goal: Pt/caregiver will Perform Home Exercise Program Outcome: Progressing Flowsheets (Taken 09/23/2021 1103) Pt/caregiver will Perform Home Exercise Program:  For increased strengthening  For improved balance  Independently  11:04 AM, 09/23/21 Mearl Latin PT, DPT Physical Therapist at [facility]

## 2021-09-23 NOTE — Assessment & Plan Note (Addendum)
Baseline creatinine 1.1-1.2 Secondary to volume depletion Patient presented with serum creatinine 2.46 Continue IV fluids 05/06/2021 renal ultrasound--diffuse renal cortical thinning and increased echogenicity bilateral. Serum creatinine 1.20 on day of d/c

## 2021-09-23 NOTE — Assessment & Plan Note (Signed)
Restart amlodipine

## 2021-09-23 NOTE — Assessment & Plan Note (Signed)
Stable on room air Continue Singulair

## 2021-09-24 ENCOUNTER — Encounter (HOSPITAL_COMMUNITY): Payer: Self-pay | Admitting: Internal Medicine

## 2021-09-24 DIAGNOSIS — J449 Chronic obstructive pulmonary disease, unspecified: Secondary | ICD-10-CM | POA: Diagnosis not present

## 2021-09-24 DIAGNOSIS — E1143 Type 2 diabetes mellitus with diabetic autonomic (poly)neuropathy: Secondary | ICD-10-CM | POA: Diagnosis not present

## 2021-09-24 DIAGNOSIS — N179 Acute kidney failure, unspecified: Secondary | ICD-10-CM | POA: Diagnosis not present

## 2021-09-24 DIAGNOSIS — E875 Hyperkalemia: Secondary | ICD-10-CM | POA: Diagnosis not present

## 2021-09-24 LAB — COMPREHENSIVE METABOLIC PANEL
ALT: 28 U/L (ref 0–44)
AST: 20 U/L (ref 15–41)
Albumin: 4.1 g/dL (ref 3.5–5.0)
Alkaline Phosphatase: 56 U/L (ref 38–126)
Anion gap: 5 (ref 5–15)
BUN: 28 mg/dL — ABNORMAL HIGH (ref 8–23)
CO2: 25 mmol/L (ref 22–32)
Calcium: 9.6 mg/dL (ref 8.9–10.3)
Chloride: 109 mmol/L (ref 98–111)
Creatinine, Ser: 1.2 mg/dL — ABNORMAL HIGH (ref 0.44–1.00)
GFR, Estimated: 48 mL/min — ABNORMAL LOW (ref >60)
Glucose, Bld: 103 mg/dL — ABNORMAL HIGH (ref 70–99)
Potassium: 4.7 mmol/L (ref 3.5–5.1)
Sodium: 139 mmol/L (ref 135–145)
Total Bilirubin: 0.6 mg/dL (ref 0.3–1.2)
Total Protein: 7.3 g/dL (ref 6.5–8.1)

## 2021-09-24 LAB — CBC
HCT: 33 % — ABNORMAL LOW (ref 36.0–46.0)
Hemoglobin: 9.5 g/dL — ABNORMAL LOW (ref 12.0–15.0)
MCH: 22.6 pg — ABNORMAL LOW (ref 26.0–34.0)
MCHC: 28.8 g/dL — ABNORMAL LOW (ref 30.0–36.0)
MCV: 78.4 fL — ABNORMAL LOW (ref 80.0–100.0)
Platelets: 230 10*3/uL (ref 150–400)
RBC: 4.21 MIL/uL (ref 3.87–5.11)
RDW: 14.8 % (ref 11.5–15.5)
WBC: 5.4 10*3/uL (ref 4.0–10.5)
nRBC: 0 % (ref 0.0–0.2)

## 2021-09-24 LAB — GLUCOSE, CAPILLARY
Glucose-Capillary: 97 mg/dL (ref 70–99)
Glucose-Capillary: 107 mg/dL — ABNORMAL HIGH (ref 70–99)

## 2021-09-24 LAB — MAGNESIUM: Magnesium: 1.7 mg/dL (ref 1.7–2.4)

## 2021-09-24 LAB — APTT: aPTT: 26 seconds (ref 24–36)

## 2021-09-24 LAB — PHOSPHORUS: Phosphorus: 3.2 mg/dL (ref 2.5–4.6)

## 2021-09-24 MED ORDER — LORAZEPAM 0.5 MG PO TABS
0.5000 mg | ORAL_TABLET | Freq: Two times a day (BID) | ORAL | Status: DC | PRN
Start: 2021-09-24 — End: 2021-09-24
  Administered 2021-09-24: 0.5 mg via ORAL
  Filled 2021-09-24: qty 1

## 2021-09-24 MED ORDER — ENOXAPARIN SODIUM 40 MG/0.4ML IJ SOSY: 40.0000 mg | PREFILLED_SYRINGE | INTRAMUSCULAR | Status: DC

## 2021-09-24 NOTE — Discharge Summary (Signed)
Physician Discharge Summary   Patient: [name] MRN: [id number] DOB: 1951-01-07  Admit date:     09/22/2021  Discharge date: 09/24/21  Discharge Physician: Shanon Brow Venissa Nappi   PCP: [name], MD   Recommendations at discharge:   Please follow up with primary care provider within 1-2 weeks  Please repeat BMP and CBC in one week     Hospital Course: 71 year old female with a history of anemia of chronic disease, bipolar disorder, COPD, hypertension, diabetes mellitus type 2, CKD stage III, chronic back pain, hyperlipidemia presenting to the ED secondary to abnormal labs and generalized weakness.  Notably, the patient was recently admitted to the hospital from 09/16/2018 3-70 23 for generalized weakness.  She was treated for acute on chronic renal failure.  She was fluid resuscitated with improvement of her serum creatinine from 3.11-1.14 on the day of discharge.  She refused SNF placement. After discharge, the patient had routine blood work done on 09/22/2021 which showed her serum creatinine elevated up to 2.46.  As result, the patient was advised to come to the emergency department for further evaluation and treatment.  The patient denies any fevers, chills, chest pain, shortness breath, nausea, vomiting, direct abdominal pain.  She denies any new medications since discharge.3 In the ED, the patient was afebrile and hemodynamically stable with oxygen saturation 100% room air.  BMP showed sodium 136, potassium 5.7, bicarbonate 23, serum creatinine 2.46.  LFTs were unremarkable.  WBC 7.1, hemoglobin 9.6, platelet 245,000.  The patient was given 1.5 L bolus and started on normal saline.  Assessment and Plan: * Acute renal failure superimposed on stage 3a chronic kidney disease (HCC) Baseline creatinine 1.1-1.2 Secondary to volume depletion Patient presented with serum creatinine 2.46 Continue IV fluids 05/06/2021 renal ultrasound--diffuse renal cortical thinning and increased echogenicity  bilateral. Serum creatinine 1.20 on day of d/c  Hyperkalemia Patient given Lokelma at 2345 and IVF started Repeat BMP Remain on telemetry--no concerning dyrhthymias Personally reviewed EKG--sinus rhythm, J-point elevation unchanged vs 09/15/2021 Resolved at time of d/c  Acquired hypothyroidism Continue levothyroxine  Well controlled type 2 diabetes mellitus with gastroparesis ([place]) 09/19/2021 hemoglobin A1c 5.5 At baseline, patient takes Toujeo 15 units at bedtime and NovoLog 10 units with small meals, 15 units with large meals Continue reduced dose Semglee  GERD Continue pantoprazole  Essential hypertension Restart amlodipine and metoprolol  Mixed hyperlipidemia Continue Zetia  COPD mixed type (HCC)-resolved as of 09/23/2021 Stable on room air Continue Singulair         Consultants: none Procedures performed: none  Disposition: Home Diet recommendation:  Carb modified diet DISCHARGE MEDICATION: Allergies as of 09/24/2021       Reactions   Iron Nausea And Vomiting   And itching And itching   Milk (cow) Rash   Doesn't agree with stomach.  Doesn't agree with stomach.  Doesn't agree with stomach.    Penicillins Hives   Has patient had a PCN reaction causing immediate rash, facial/tongue/throat swelling, SOB or lightheadedness with hypotension: Yes Has patient had a PCN reaction causing severe rash involving mucus membranes or skin necrosis: No Has patient had a PCN reaction that required hospitalization No Has patient had a PCN reaction occurring within the last 10 years: No If all of the above answers are "NO", then may proceed with Cephalosporin use. Other reaction(s): Other (see comments) Has patient had a PCN reaction causing immediate rash, facial/tongue/throat swelling, SOB or lightheadedness with hypotension: Yes Has patient had a PCN reaction causing severe rash  involving mucus membranes or skin necrosis: No Has patient had a PCN reaction that required  hospitalization No Has patient had a PCN reaction occurring within the last 10 years: No If all of the above answers are "NO", then may proceed with Cephalosporin use. Has patient had a PCN reaction causing immediate rash, facial/tongue/throat swelling, SOB or lightheadedness with hypotension: Yes Has patient had a PCN reaction causing severe rash involving mucus membranes or skin necrosis: No Has patient had a PCN reaction that required hospitalization No Has patient had a PCN reaction occurring within the last 10 years: No If all of the above answers are "NO", then may proceed with Cephalosporin use. Has patient had a PCN reaction causing immediate rash, facial/tongue/throat swelling, SOB or lightheadedness with hypotension: Yes Has patient had a PCN reaction causing severe rash involving mucus membranes or skin necrosis: No Has patient had a PCN reaction that required hospitalization No Has patient had a PCN reaction occurring within the last 10 years: No If all of the above answers are "NO", then may proceed with Cephalosporin use. Has patient had a PCN reaction causing immediate rash, facial/tongue/throat swelling, SOB or lightheadedness with hypotension: Yes Has patient had a PCN reaction causing sever... (TRUNCATED)   Phenazopyridine Hives   Phenazopyridine Hcl Hives   Cephalexin Hives   Flonase [fluticasone]    "It gave me ulcers in my nose"   Milk-related Compounds Other (See Comments)   Doesn't agree with stomach.    Phenazopyridine Hcl Hives             Medication List     TAKE these medications    Accu-Chek Guide Control Liqd USE AS DIRECTED   Accu-Chek Guide test strip Generic drug: glucose blood TEST BLOOD SUGAR FOUR TIMES DAILY  AND AS NEEDED   Accu-Chek Softclix Lancets lancets TEST BLOOD SUGAR THREE TIMES DAILY AS DIRECTED   albuterol 108 (90 Base) MCG/ACT inhaler Commonly known as: VENTOLIN HFA INHALE 1 TO 2 PUFFS EVERY 6 HOURS AS NEEDED FOR WHEEZING,  SHORTNESS OF BREATH What changed: See the new instructions.   alendronate 70 MG tablet Commonly known as: FOSAMAX TAKE 1 TABLET (70 MG TOTAL) BY MOUTH EVERY 7 (SEVEN) DAYS. TAKE WITH A FULL GLASS OF WATER ON AN EMPTY STOMACH.   amLODipine 10 MG tablet Commonly known as: NORVASC TAKE 1 TABLET EVERY DAY   ascorbic acid 500 MG tablet Commonly known as: VITAMIN C Take 500 mg by mouth daily.   aspirin EC 81 MG tablet Take 1 tablet (81 mg total) by mouth daily with breakfast.   blood glucose meter kit and supplies Dispense based on patient and insurance preference. Use up to four times daily as directed. (FOR ICD-10 E10.9, E11.9).   buPROPion 150 MG 24 hr tablet Commonly known as: Wellbutrin XL Take 1 tablet (150 mg total) by mouth every morning.   Caltrate 600+D3 Soft 600-20 MG-MCG Chew Generic drug: Calcium Carb-Cholecalciferol Chew 1 tablet by mouth daily at 12 noon.   cetirizine 10 MG tablet Commonly known as: ZYRTEC Take 1 tablet (10 mg total) by mouth daily.   Clobetasol Prop Emollient Base 0.05 % emollient cream Commonly known as: Clobetasol Propionate E Apply to affected area qd What changed:  how much to take how to take this when to take this additional instructions   D3 PO Take 1 tablet by mouth daily.   diclofenac Sodium 1 % Gel Commonly known as: VOLTAREN APPLY 2 GRAMS TOPICALLY FOUR TIMES DAILY. What changed: See  the new instructions.   Droplet Pen Needles 31G X 8 MM Misc Generic drug: Insulin Pen Needle USE FOR INJECTING INSULIN 4 TIMES DAILY.   DropSafe Alcohol Prep 70 % Pads USE TO CLEAN FINGER PRIOR TO TESTING FOR BLOOD SUGAR  AS DIRECTED   DSS 100 MG Caps Take 1 tablet by mouth in the morning and at bedtime.   ezetimibe 10 MG tablet Commonly known as: ZETIA TAKE 1 TABLET EVERY DAY   FISH OIL PO Take 1 capsule by mouth daily.   FreeStyle Libre 14 Day Sensor Misc 1 each by Does not apply route every 14 (fourteen) days. Change every 2  weeks   lamoTRIgine 100 MG tablet Commonly known as: LAMICTAL Take 1 tablet (100 mg total) by mouth 2 (two) times daily.   levothyroxine 50 MCG tablet Commonly known as: SYNTHROID TAKE 1 TABLET EVERY DAY FOR 6 DAYS PER WEEK, 1/2 TABLET 1 DAY PER WEEK   LORazepam 0.5 MG tablet Commonly known as: Ativan Take 1 tablet (0.5 mg total) by mouth 2 (two) times daily.   Mattress Pad Misc FIRM MATTRESS PAD for hospital bed x 1   meclizine 25 MG tablet Commonly known as: ANTIVERT Take 1 tablet (25 mg total) by mouth 3 (three) times daily as needed for dizziness.   metoprolol tartrate 50 MG tablet Commonly known as: LOPRESSOR TAKE 1 TABLET TWICE DAILY (NEED MD APPOINTMENT) What changed: See the new instructions.   mirabegron ER 25 MG Tb24 tablet Commonly known as: MYRBETRIQ Take 1 tablet (25 mg total) by mouth daily.   montelukast 10 MG tablet Commonly known as: SINGULAIR TAKE 1 TABLET EVERY DAY What changed: when to take this   MULTIVITAMIN GUMMIES ADULT PO Take 1 tablet by mouth daily.   NovoLOG FlexPen 100 UNIT/ML FlexPen Generic drug: insulin aspart Inject 5 Units into the skin 3 (three) times daily with meals. Give if eats 50% or more of meal.   nystatin powder Commonly known as: MYCOSTATIN/NYSTOP APPLY TO AFFECTED AREA 4 TIMES DAILY. What changed: See the new instructions.   ondansetron 4 MG tablet Commonly known as: Zofran Take 1 tablet (4 mg total) by mouth every 8 (eight) hours as needed for nausea or vomiting.   oxyCODONE-acetaminophen 10-325 MG tablet Commonly known as: PERCOCET Take 1 tablet by mouth every 8 (eight) hours as needed for pain.   pregabalin 75 MG capsule Commonly known as: LYRICA Take 1 capsule (75 mg total) by mouth daily.   RABEprazole 20 MG tablet Commonly known as: ACIPHEX Take 1 tablet (20 mg total) by mouth 2 (two) times daily.   Restasis 0.05 % ophthalmic emulsion Generic drug: cycloSPORINE Place 2 drops into both eyes daily.    risperiDONE 0.5 MG tablet Commonly known as: RISPERDAL Take 1 tablet (0.5 mg total) by mouth at bedtime.   sertraline 100 MG tablet Commonly known as: ZOLOFT Take 1 tablet (100 mg total) by mouth every morning.   Stiolto Respimat 2.5-2.5 MCG/ACT Aers Generic drug: Tiotropium Bromide-Olodaterol INHALE 2 PUFFS INTO THE LUNGS DAILY.   Toujeo Max SoloStar 300 UNIT/ML Solostar Pen Generic drug: insulin glargine (2 Unit Dial) Inject 15 Units into the skin at bedtime.   traZODone 150 MG tablet Commonly known as: DESYREL Take 1 tablet (150 mg total) by mouth at bedtime.        Follow-up Information     [facility] Follow up.   Why: PT will call to schedule your first home visit.  Discharge Exam: Filed Weights   09/22/21 1911  Weight: 74.4 kg   HEENT:  [place]/AT, No thrush, no icterus CV:  RRR, no rub, no S3, no S4 Lung:  CTA, no wheeze, no rhonchi Abd:  soft/+BS, NT Ext:  No edema, no lymphangitis, no synovitis, no rash   Condition at discharge: stable  The results of significant diagnostics from this hospitalization (including imaging, microbiology, ancillary and laboratory) are listed below for reference.   Imaging Studies: CT Cervical Spine Wo Contrast  Result Date: 09/15/2021 CLINICAL DATA:  Status post fall due to weakness. EXAM: CT CERVICAL SPINE WITHOUT CONTRAST TECHNIQUE: Multidetector CT imaging of the cervical spine was performed without intravenous contrast. Multiplanar CT image reconstructions were also generated. RADIATION DOSE REDUCTION: This exam was performed according to the departmental dose-optimization program which includes automated exposure control, adjustment of the mA and/or kV according to patient size and/or use of iterative reconstruction technique. COMPARISON:  October 16, 2020 FINDINGS: Alignment: There is straightening of the normal cervical spine lordosis. Skull base and vertebrae: No acute fracture. No primary bone lesion  or focal pathologic process. Soft tissues and spinal canal: No prevertebral fluid or swelling. No visible canal hematoma. Disc levels: Moderate to marked severity endplate sclerosis and moderate severity anterior osteophyte formation are seen at the levels of C4-C5, C5-C6, C6-C7 and C7-T1. There is moderate severity narrowing of the anterior atlantoaxial articulation. Moderate to marked severity intervertebral disc space narrowing is seen at C5-C6, C6-C7 and C7-T1. Mild, bilateral multilevel facet joint hypertrophy is noted. Upper chest: Negative. Other: None. IMPRESSION: 1. No acute fracture or subluxation of the cervical spine. 2. Moderate to marked severity multilevel degenerative changes, as described above. Electronically Signed   By: Virgina Norfolk M.D.   On: 09/15/2021 22:26   CT Head Wo Contrast  Result Date: 09/15/2021 CLINICAL DATA:  Status post fall due to weakness. EXAM: CT HEAD WITHOUT CONTRAST TECHNIQUE: Contiguous axial images were obtained from the base of the skull through the vertex without intravenous contrast. RADIATION DOSE REDUCTION: This exam was performed according to the departmental dose-optimization program which includes automated exposure control, adjustment of the mA and/or kV according to patient size and/or use of iterative reconstruction technique. COMPARISON:  November 24, 2020 FINDINGS: Brain: There is mild cerebral atrophy with widening of the extra-axial spaces and ventricular dilatation. There are areas of decreased attenuation within the white matter tracts of the supratentorial brain, consistent with microvascular disease changes. Stable areas of bifrontal encephalomalacia are noted. A small amount of residual meningioma is again noted within the planum sphenoidale. Vascular: Bilateral cavernous carotid artery calcification is present. Skull: A midline frontal craniotomy defect is seen. Sinuses/Orbits: No acute finding. Other: None. IMPRESSION: 1. Generalized cerebral  atrophy and chronic white matter small vessel ischemic changes, without an acute intracranial abnormality. 2. 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 3. Stable small amount of residual meningioma within the planum sphenoidale. Electronically Signed   By: Virgina Norfolk M.D.   On: 09/15/2021 22:22    Microbiology: Results for orders placed or performed during the hospital encounter of 09/15/21  Urine Culture     Status: Abnormal   Collection Time: 09/16/21  6:03 AM   Specimen: Urine, Clean Catch  Result Value Ref Range Status   Specimen Description   Final    URINE, CLEAN CATCH Performed at [facility], [address] [place], [place] [address]    Special Requests   Final    NONE Performed at [facility]  [address] [place], [place] [address]    Culture (A)  Final    <10,000 COLONIES/mL INSIGNIFICANT GROWTH Performed at [facility] [address], [place], [place] [address]    Report Status 09/18/2021 FINAL  Final  MRSA Next Gen by PCR, Nasal     Status: None   Collection Time: 09/16/21  6:07 AM   Specimen: Nasal Mucosa; Nasal Swab  Result Value Ref Range Status   MRSA by PCR Next Gen NOT DETECTED NOT DETECTED Final    Comment: (NOTE) The GeneXpert MRSA Assay (FDA approved for NASAL specimens only), is one component of a comprehensive MRSA colonization surveillance program. It is not intended to diagnose MRSA infection nor to guide or monitor treatment for MRSA infections. Test performance is not FDA approved in patients less than 71 years old. Performed at [facility], [address], [place], [place] [address]    *Note: Due to a large number of results and/or encounters for the requested time period, some results have not been displayed. A complete set of results can be found in Results Review.    Labs: CBC: Recent Labs  Lab 09/22/21 1639 09/22/21 1951 09/24/21 0634  WBC 7.3 7.1 5.4  NEUTROABS  --  4.0  --   HGB 9.2* 9.6*  9.5*  HCT 32.5* 33.2* 33.0*  MCV 80.0 80.4 78.4*  PLT 239 245 914   Basic Metabolic Panel: Recent Labs  Lab 09/22/21 1639 09/22/21 1951 09/23/21 0726 09/24/21 0634  NA 137 136 139 139  K 6.4* 5.7* 5.2* 4.7  CL 108 108 111 109  CO2 25 23 23 25   GLUCOSE 88 157* 110* 103*  BUN 56* 56* 46* 28*  CREATININE 2.59* 2.46* 1.77* 1.20*  CALCIUM 9.7 9.5 8.8* 9.6  MG  --   --   --  1.7  PHOS  --   --   --  3.2   Liver Function Tests: Recent Labs  Lab 09/22/21 1639 09/24/21 0634  AST 22 20  ALT 31 28  ALKPHOS 65 56  BILITOT 0.4 0.6  PROT 6.9 7.3  ALBUMIN 4.0 4.1   CBG: Recent Labs  Lab 09/23/21 1122 09/23/21 1552 09/23/21 2132 09/24/21 0724 09/24/21 1120  GLUCAP 166* 103* 111* 97 107*    Discharge time spent: greater than 30 minutes.  Signed: Orson Eva, MD [facility] 09/24/2021

## 2021-09-24 NOTE — Progress Notes (Signed)
Nsg Discharge Note  Admit Date:  09/22/2021 Discharge date: 09/24/2021   Rebbeca Paul Takahashi to be D/C'd Home per MD order.  AVS completed.   Patient/caregiver able to verbalize understanding.  Discharge Medication: Allergies as of 09/24/2021       Reactions   Iron Nausea And Vomiting   And itching And itching   Milk (cow) Rash   Doesn't agree with stomach.  Doesn't agree with stomach.  Doesn't agree with stomach.    Penicillins Hives   Has patient had a PCN reaction causing immediate rash, facial/tongue/throat swelling, SOB or lightheadedness with hypotension: Yes Has patient had a PCN reaction causing severe rash involving mucus membranes or skin necrosis: No Has patient had a PCN reaction that required hospitalization No Has patient had a PCN reaction occurring within the last 10 years: No If all of the above answers are "NO", then may proceed with Cephalosporin use. Other reaction(s): Other (see comments) Has patient had a PCN reaction causing immediate rash, facial/tongue/throat swelling, SOB or lightheadedness with hypotension: Yes Has patient had a PCN reaction causing severe rash involving mucus membranes or skin necrosis: No Has patient had a PCN reaction that required hospitalization No Has patient had a PCN reaction occurring within the last 10 years: No If all of the above answers are "NO", then may proceed with Cephalosporin use. Has patient had a PCN reaction causing immediate rash, facial/tongue/throat swelling, SOB or lightheadedness with hypotension: Yes Has patient had a PCN reaction causing severe rash involving mucus membranes or skin necrosis: No Has patient had a PCN reaction that required hospitalization No Has patient had a PCN reaction occurring within the last 10 years: No If all of the above answers are "NO", then may proceed with Cephalosporin use. Has patient had a PCN reaction causing immediate rash, facial/tongue/throat swelling, SOB or lightheadedness with  hypotension: Yes Has patient had a PCN reaction causing severe rash involving mucus membranes or skin necrosis: No Has patient had a PCN reaction that required hospitalization No Has patient had a PCN reaction occurring within the last 10 years: No If all of the above answers are "NO", then may proceed with Cephalosporin use. Has patient had a PCN reaction causing immediate rash, facial/tongue/throat swelling, SOB or lightheadedness with hypotension: Yes Has patient had a PCN reaction causing sever... (TRUNCATED)   Phenazopyridine Hives   Phenazopyridine Hcl Hives   Cephalexin Hives   Flonase [fluticasone]    "It gave me ulcers in my nose"   Milk-related Compounds Other (See Comments)   Doesn't agree with stomach.    Phenazopyridine Hcl Hives             Medication List     TAKE these medications    Accu-Chek Guide Control Liqd USE AS DIRECTED   Accu-Chek Guide test strip Generic drug: glucose blood TEST BLOOD SUGAR FOUR TIMES DAILY  AND AS NEEDED   Accu-Chek Softclix Lancets lancets TEST BLOOD SUGAR THREE TIMES DAILY AS DIRECTED   albuterol 108 (90 Base) MCG/ACT inhaler Commonly known as: VENTOLIN HFA INHALE 1 TO 2 PUFFS EVERY 6 HOURS AS NEEDED FOR WHEEZING, SHORTNESS OF BREATH What changed: See the new instructions.   alendronate 70 MG tablet Commonly known as: FOSAMAX TAKE 1 TABLET (70 MG TOTAL) BY MOUTH EVERY 7 (SEVEN) DAYS. TAKE WITH A FULL GLASS OF WATER ON AN EMPTY STOMACH.   amLODipine 10 MG tablet Commonly known as: NORVASC TAKE 1 TABLET EVERY DAY   ascorbic acid 500 MG tablet Commonly  known as: VITAMIN C Take 500 mg by mouth daily.   aspirin EC 81 MG tablet Take 1 tablet (81 mg total) by mouth daily with breakfast.   blood glucose meter kit and supplies Dispense based on patient and insurance preference. Use up to four times daily as directed. (FOR ICD-10 E10.9, E11.9).   buPROPion 150 MG 24 hr tablet Commonly known as: Wellbutrin XL Take 1 tablet  (150 mg total) by mouth every morning.   Caltrate 600+D3 Soft 600-20 MG-MCG Chew Generic drug: Calcium Carb-Cholecalciferol Chew 1 tablet by mouth daily at 12 noon.   cetirizine 10 MG tablet Commonly known as: ZYRTEC Take 1 tablet (10 mg total) by mouth daily.   Clobetasol Prop Emollient Base 0.05 % emollient cream Commonly known as: Clobetasol Propionate E Apply to affected area qd What changed:  how much to take how to take this when to take this additional instructions   D3 PO Take 1 tablet by mouth daily.   diclofenac Sodium 1 % Gel Commonly known as: VOLTAREN APPLY 2 GRAMS TOPICALLY FOUR TIMES DAILY. What changed: See the new instructions.   Droplet Pen Needles 31G X 8 MM Misc Generic drug: Insulin Pen Needle USE FOR INJECTING INSULIN 4 TIMES DAILY.   DropSafe Alcohol Prep 70 % Pads USE TO CLEAN FINGER PRIOR TO TESTING FOR BLOOD SUGAR  AS DIRECTED   DSS 100 MG Caps Take 1 tablet by mouth in the morning and at bedtime.   ezetimibe 10 MG tablet Commonly known as: ZETIA TAKE 1 TABLET EVERY DAY   FISH OIL PO Take 1 capsule by mouth daily.   FreeStyle Libre 14 Day Sensor Misc 1 each by Does not apply route every 14 (fourteen) days. Change every 2 weeks   lamoTRIgine 100 MG tablet Commonly known as: LAMICTAL Take 1 tablet (100 mg total) by mouth 2 (two) times daily.   levothyroxine 50 MCG tablet Commonly known as: SYNTHROID TAKE 1 TABLET EVERY DAY FOR 6 DAYS PER WEEK, 1/2 TABLET 1 DAY PER WEEK   LORazepam 0.5 MG tablet Commonly known as: Ativan Take 1 tablet (0.5 mg total) by mouth 2 (two) times daily.   Mattress Pad Misc FIRM MATTRESS PAD for hospital bed x 1   meclizine 25 MG tablet Commonly known as: ANTIVERT Take 1 tablet (25 mg total) by mouth 3 (three) times daily as needed for dizziness.   metoprolol tartrate 50 MG tablet Commonly known as: LOPRESSOR TAKE 1 TABLET TWICE DAILY (NEED MD APPOINTMENT) What changed: See the new instructions.    mirabegron ER 25 MG Tb24 tablet Commonly known as: MYRBETRIQ Take 1 tablet (25 mg total) by mouth daily.   montelukast 10 MG tablet Commonly known as: SINGULAIR TAKE 1 TABLET EVERY DAY What changed: when to take this   MULTIVITAMIN GUMMIES ADULT PO Take 1 tablet by mouth daily.   NovoLOG FlexPen 100 UNIT/ML FlexPen Generic drug: insulin aspart Inject 5 Units into the skin 3 (three) times daily with meals. Give if eats 50% or more of meal.   nystatin powder Commonly known as: MYCOSTATIN/NYSTOP APPLY TO AFFECTED AREA 4 TIMES DAILY. What changed: See the new instructions.   ondansetron 4 MG tablet Commonly known as: Zofran Take 1 tablet (4 mg total) by mouth every 8 (eight) hours as needed for nausea or vomiting.   oxyCODONE-acetaminophen 10-325 MG tablet Commonly known as: PERCOCET Take 1 tablet by mouth every 8 (eight) hours as needed for pain.   pregabalin 75 MG capsule Commonly known  as: LYRICA Take 1 capsule (75 mg total) by mouth daily.   RABEprazole 20 MG tablet Commonly known as: ACIPHEX Take 1 tablet (20 mg total) by mouth 2 (two) times daily.   Restasis 0.05 % ophthalmic emulsion Generic drug: cycloSPORINE Place 2 drops into both eyes daily.   risperiDONE 0.5 MG tablet Commonly known as: RISPERDAL Take 1 tablet (0.5 mg total) by mouth at bedtime.   sertraline 100 MG tablet Commonly known as: ZOLOFT Take 1 tablet (100 mg total) by mouth every morning.   Stiolto Respimat 2.5-2.5 MCG/ACT Aers Generic drug: Tiotropium Bromide-Olodaterol INHALE 2 PUFFS INTO THE LUNGS DAILY.   Toujeo Max SoloStar 300 UNIT/ML Solostar Pen Generic drug: insulin glargine (2 Unit Dial) Inject 15 Units into the skin at bedtime.   traZODone 150 MG tablet Commonly known as: DESYREL Take 1 tablet (150 mg total) by mouth at bedtime.        Discharge Assessment: Vitals:   09/23/21 2130 09/24/21 0400  BP: (!) 141/50 (!) 150/76  Pulse: 69 74  Resp: 18 15  Temp: 98.4 F  (36.9 C) 98.5 F (36.9 C)  SpO2: 99% 100%   Skin clean, dry and intact without evidence of skin break down, no evidence of skin tears noted. IV catheter discontinued intact. Site without signs and symptoms of complications - no redness or edema noted at insertion site, patient denies c/o pain - only slight tenderness at site.  Dressing with slight pressure applied.  D/c Instructions-Education: Discharge instructions given to patient/family with verbalized understanding. D/c education completed with patient/family including follow up instructions, medication list, d/c activities limitations if indicated, with other d/c instructions as indicated by MD - patient able to verbalize understanding, all questions fully answered. Patient instructed to return to ED, call 911, or call MD for any changes in condition.  Patient escorted via [place], and D/C home via private auto.  Kathie Rhodes, RN 09/24/2021 12:10 PM

## 2021-09-25 ENCOUNTER — Encounter: Payer: Self-pay | Admitting: Family Medicine

## 2021-09-25 DIAGNOSIS — E860 Dehydration: Secondary | ICD-10-CM | POA: Insufficient documentation

## 2021-09-25 DIAGNOSIS — Z7689 Persons encountering health services in other specified circumstances: Secondary | ICD-10-CM | POA: Insufficient documentation

## 2021-09-25 NOTE — Assessment & Plan Note (Signed)
Recently admitted for AKI, post discharge noted to nbe increasingly somnolent, I am concerned that she is afain dehydrated, clinically she does have signs of dehydration, will obtain stat labs to determine disposition

## 2021-09-25 NOTE — Assessment & Plan Note (Signed)
Clinical exam c/w dehydration , confirmed with stat ;labs also hyperkalemic, re admission needed

## 2021-09-25 NOTE — Progress Notes (Signed)
   [name]     MRN: [id number]      DOB: 1950/10/04   HPI [name] is here for transition of care visit following hospitalization from 07/06 to 09/17/2021 with an admission diagnosis of AKI and UTI States she has not yet returned to baseliung , and actually c/o weak and light headed, and extra sleepy. Aide with her is new to her but notes downhill course over the past 3 days ROS Denies recent fever or chills. Denies sinus pressure, nasal congestion, ear pain or sore throat. Denies chest congestion, productive cough or wheezing. Denies chest pains, palpitations and leg swelling Denies abdominal pain, nausea, vomiting,diarrhea or constipation.   Chronic  joint pain,  and limitation in mobility. . Chronic  depression, anxiety and insomnia. Denies skin break down or rash.   PE  BP 120/62   Pulse 69   Ht '4\' 11"'$  (1.499 m)   SpO2 91%   BMI 33.12 kg/m   Patient somnolent not at baseline , but below , in terms of interaction and communication HEENT: No facial asymmetry, EOMI,     Neck decreased .decreased skin turgor  Chest: Clear to auscultation bilaterally.  CVS: S1, S2 no murmurs, no S3.Regular rate.  ABD: Soft non tender.   Ext: No edema  MS: decreased  ROM spine, shoulders, hips and knees.  Skin: Intact, no ulcerations or rash noted.   CNS: CN 2-12 intact, power,  normal throughout.no focal deficits noted.   Assessment & Plan  AKI (acute kidney injury) ([place]) Recently admitted for AKI, post discharge noted to nbe increasingly somnolent, I am concerned that she is afain dehydrated, clinically she does have signs of dehydration, will obtain stat labs to determine disposition  Dehydration Clinical exam c/w dehydration , confirmed with stat ;labs also hyperkalemic, re admission needed  Encounter for support and coordination of transition of care Patient in for follow up of recent hospitalization. Discharge summary, and laboratory and radiology data are reviewed,  and any questions or concerns  are discussed. Specific issues requiring follow up are specifically addressed.

## 2021-09-25 NOTE — Assessment & Plan Note (Signed)
Patient in for follow up of recent hospitalization. Discharge summary, and laboratory and radiology data are reviewed, and any questions or concerns  are discussed. Specific issues requiring follow up are specifically addressed.  

## 2021-09-26 ENCOUNTER — Telehealth: Payer: Self-pay

## 2021-09-26 NOTE — Telephone Encounter (Signed)
Transition Care Management Follow-up Telephone Call Date of discharge and from where: 09/24/21 [facility] How have you been since you were released from the hospital? tired Any questions or concerns? No  Items Reviewed: Did the pt receive and understand the discharge instructions provided? Yes  Medications obtained and verified? Yes  Other?  N/a Any new allergies since your discharge? No  Dietary orders reviewed? Yes Do you have support at home? Yes   Home Care and Equipment/Supplies: Were home health services ordered? yes If so, what is the name of the agency? [facility]   Has the agency set up a time to come to the patient's home? yes Were any new equipment or medical supplies ordered?  No What is the name of the medical supply agency? N/a Were you able to get the supplies/equipment? yes Do you have any questions related to the use of the equipment or supplies? No  Functional Questionnaire: (I = Independent and D = Dependent) ADLs: I  Bathing/Dressing- I  Meal Prep- I  Eating- I  Maintaining continence- I  Transferring/Ambulation- I  Managing Meds- I  Follow up appointments reviewed:  PCP Hospital f/u appt confirmed? Yes  Scheduled to see [name] on 10/04/21 @ 2:20. [facility] f/u appt confirmed? Yes   Are transportation arrangements needed? No  If their condition worsens, is the pt aware to call PCP or go to the Emergency Dept.? Yes Was the patient provided with contact information for the PCP's office or ED? Yes Was to pt encouraged to call back with questions or concerns? Yes

## 2021-09-27 ENCOUNTER — Other Ambulatory Visit: Payer: Self-pay

## 2021-09-27 NOTE — Patient Outreach (Signed)
[facility]) Care Management  09/27/2021  [name] [birth year] [id number]  *Remote review coverage - post hospital transition request for readmission - [facility] readmission less than 30 days  [facility] [ACO] Patient: [name]  Primary Care Provider:  [name], MD, with [facility], is an embedded provider with a Chronic Care Management team and program, and is listed for the transition of care follow up and appointments.  Patient is showing as active in  Embedded practice service needs for chronic care management/care coordination with a [facility] Embedded RNCM.  Plan: Follow up with TOC has been done and Embedded Care Management RNCM following, information to Quality team regarding readmission sent.  Please contact for further questions,  Natividad Brood, RN BSN [facility] Liaison  [phone] business mobile phone Toll free office [phone]  Fax number: [phone] [email or web address]

## 2021-09-28 ENCOUNTER — Ambulatory Visit (INDEPENDENT_AMBULATORY_CARE_PROVIDER_SITE_OTHER): Payer: Medicare HMO | Admitting: Psychiatry

## 2021-09-28 DIAGNOSIS — M150 Primary generalized (osteo)arthritis: Secondary | ICD-10-CM

## 2021-09-28 DIAGNOSIS — R296 Repeated falls: Secondary | ICD-10-CM

## 2021-09-28 DIAGNOSIS — K219 Gastro-esophageal reflux disease without esophagitis: Secondary | ICD-10-CM

## 2021-09-28 DIAGNOSIS — E039 Hypothyroidism, unspecified: Secondary | ICD-10-CM

## 2021-09-28 DIAGNOSIS — I509 Heart failure, unspecified: Secondary | ICD-10-CM | POA: Diagnosis not present

## 2021-09-28 DIAGNOSIS — J449 Chronic obstructive pulmonary disease, unspecified: Secondary | ICD-10-CM

## 2021-09-28 DIAGNOSIS — F419 Anxiety disorder, unspecified: Secondary | ICD-10-CM

## 2021-09-28 DIAGNOSIS — F331 Major depressive disorder, recurrent, moderate: Secondary | ICD-10-CM | POA: Diagnosis not present

## 2021-09-28 DIAGNOSIS — E1142 Type 2 diabetes mellitus with diabetic polyneuropathy: Secondary | ICD-10-CM

## 2021-09-28 DIAGNOSIS — F250 Schizoaffective disorder, bipolar type: Secondary | ICD-10-CM

## 2021-09-28 DIAGNOSIS — F32A Depression, unspecified: Secondary | ICD-10-CM

## 2021-09-28 DIAGNOSIS — K5900 Constipation, unspecified: Secondary | ICD-10-CM

## 2021-09-28 DIAGNOSIS — F39 Unspecified mood [affective] disorder: Secondary | ICD-10-CM

## 2021-09-28 DIAGNOSIS — I130 Hypertensive heart and chronic kidney disease with heart failure and stage 1 through stage 4 chronic kidney disease, or unspecified chronic kidney disease: Secondary | ICD-10-CM | POA: Diagnosis not present

## 2021-09-28 DIAGNOSIS — Z7982 Long term (current) use of aspirin: Secondary | ICD-10-CM

## 2021-09-28 DIAGNOSIS — K589 Irritable bowel syndrome without diarrhea: Secondary | ICD-10-CM

## 2021-09-28 DIAGNOSIS — D631 Anemia in chronic kidney disease: Secondary | ICD-10-CM

## 2021-09-28 DIAGNOSIS — E1122 Type 2 diabetes mellitus with diabetic chronic kidney disease: Secondary | ICD-10-CM | POA: Diagnosis not present

## 2021-09-28 DIAGNOSIS — M810 Age-related osteoporosis without current pathological fracture: Secondary | ICD-10-CM

## 2021-09-28 DIAGNOSIS — E785 Hyperlipidemia, unspecified: Secondary | ICD-10-CM

## 2021-09-28 DIAGNOSIS — Z8673 Personal history of transient ischemic attack (TIA), and cerebral infarction without residual deficits: Secondary | ICD-10-CM

## 2021-09-28 DIAGNOSIS — N1832 Chronic kidney disease, stage 3b: Secondary | ICD-10-CM | POA: Diagnosis not present

## 2021-09-28 DIAGNOSIS — N390 Urinary tract infection, site not specified: Secondary | ICD-10-CM

## 2021-09-28 DIAGNOSIS — Z794 Long term (current) use of insulin: Secondary | ICD-10-CM

## 2021-09-28 NOTE — Progress Notes (Signed)
Virtual Visit via Video Note  I connected with Katlin Bortner Coombes on 09/28/21 at 4:05 PM EDT by a video enabled telemedicine application and verified that I am speaking with the correct person using two identifiers.  Location: Patient: Home Provider: [place] office    I discussed the limitations of evaluation and management by telemedicine and the availability of in person appointments. The patient expressed understanding and agreed to proceed.  I provided 43 minutes of non-face-to-face time during this encounter.   [name], LCSW             THERAPIST PROGRESS NOTE    Session Time: Wednesday 09/28/2021 4:05 PM - 4:48 PM   Participation Level: Active  Behavioral Response: less depressed, talkative, alert   Type of Therapy: Individual Therapy  Treatment Goals addressed: Patient will score less than 10 on the patient health questionnaire  Progress on goals: Progressing  Interventions: CBT and Supportive             Summary: Samoria Fedorko Denardo is a 71 y.o. female who presents with  long standing history of recurrent periods  of depression beginning when she was 73 and her favorite uncle died. Patient reports multiple psychiatric hospitlaizations due to depression and suicidal ideations with the last one occuring in 1997. Patient has participated in outpatient psychotherapy and medication management intermittently since age 25.  She currently is seeing psychiatrist Dr. Harrington Challenger . Prior to this, she was seen at [facility] Patient also has had ECT at [facility]. Symptoms have worsened in recent months due to family stress and have  included depressed mood, anxiety, excessive worry, and tearfulness.            Patient's last contact was by virtual visit via telephone about  weeks ago.  She reports 2 hospitalizations due to acute kidney injury.  She was discharged from last hospitalization this past weekend.  Patient reports being very tired.  She is pleased her brother visited  her while in the hospital.  She reports more support from her brother.  She also reports changing home health agencies and obtaining a new aide.  Patient is very pleased with her new aide.  Per her report, aide is very supportive.  Patient reports feeling less depressed and focusing more on her spirituality as a coping tool.  She wants to resume attending church but expresses some frustration regarding difficulty physically maneuvering in church due to her disabilities.    Suicidal/Homicidal: Nowithout intent/plan      Therapist Response: Reviewed symptoms, discussed stressors, facilitated expression of thoughts and feelings, validated feelings, praised and reinforced patient's initiative and finding another home health agency and getting a different aide, praised and reinforced patient's use of her spirituality, assisted patient identify ways to express needs assertively rather than aggressively   Diagnosis: Axis I: MDD, Recurrent, moderate    Axis II: Deferred Collaboration of Care: Psychiatrist AEB patient working with psychiatrist Dr. Harrington Challenger  Patient/Guardian was advised Release of Information must be obtained prior to any record release in order to collaborate their care with an outside provider. Patient/Guardian was advised if they have not already done so to contact the registration department to sign all necessary forms in order for [name] to release information regarding their care.   Consent: Patient/Guardian gives verbal consent for treatment and assignment of benefits for services provided during this visit. Patient/Guardian expressed understanding and agreed to proceed.    Shanea Karney E Jeven Topper, LCSW

## 2021-09-29 ENCOUNTER — Other Ambulatory Visit: Payer: Self-pay | Admitting: Internal Medicine

## 2021-09-29 DIAGNOSIS — E1165 Type 2 diabetes mellitus with hyperglycemia: Secondary | ICD-10-CM

## 2021-09-30 ENCOUNTER — Encounter: Payer: Self-pay | Admitting: Family Medicine

## 2021-09-30 ENCOUNTER — Telehealth (INDEPENDENT_AMBULATORY_CARE_PROVIDER_SITE_OTHER): Payer: Medicare HMO | Admitting: Family Medicine

## 2021-09-30 DIAGNOSIS — H1132 Conjunctival hemorrhage, left eye: Secondary | ICD-10-CM | POA: Diagnosis not present

## 2021-09-30 DIAGNOSIS — I10 Essential (primary) hypertension: Secondary | ICD-10-CM

## 2021-09-30 DIAGNOSIS — R531 Weakness: Secondary | ICD-10-CM | POA: Diagnosis not present

## 2021-09-30 NOTE — Progress Notes (Addendum)
Virtual Visit via Video   I connected with [name] on 09/30/21 at  4:40 PM EDT by a video enabled telemedicine application Location: Patient: home Provider: office   I discussed the limitations of evaluation and management by telemedicine and the availability of in person appointments. The patient expressed understanding and agreed to proceed.  History of Present Illness: C/o extreme fatigue  and weakness progressively worsening since ger recent re admission for dehydration and AKI. Reports feeling as though she will fall. C/opainless redness of left eye  acutely, no known trauma, no vision loss. Has sitting for 8/hrs daily and has concerns of having no one to take her to the ED as I recommend   Observations/Objective: There were no vitals taken for this visit. Good communication with no confusion and intact memory. Alert and oriented x 3 No signs of respiratory distress during speech   Assessment and Plan:  Weakness generalized Report of recurrent generalized weakness, similar complaint in past 2 weeks, when re hospitaluzation for dehydration and AKI was required, advised ED eval  Subconjunctival hemorrhage of left eye Acute bleed, no pain or loss of vision reported, monitor at home if new symptoms mentioned develop then ED eval  Follow Up Instructions:    I discussed the assessment and treatment plan with the patient. The patient was provided an opportunity to ask questions and all were answered. The patient agreed with the plan and demonstrated an understanding of the instructions.   The patient was advised to call back or seek an in-person evaluation if the symptoms worsen or if the condition fails to improve as anticipated.  I provided 12 minutes of face-to-face time during this encounter.   Tula Nakayama, MD

## 2021-09-30 NOTE — Patient Instructions (Addendum)
F/u in office as before  I recommend you go to the ED for further evaluation since you you report feeling tired I recommend you go to the ED for further evaluation  I am concerned that ypou may again be dehydrated and getting into kidney failure  The picture you  sent of your weye shows bleeing , if you develop pain or change in vision you need to go to the eD. Normally , this will get better on it's own over time  Thanks for choosing [facility], we consider it a privelige to serve you.

## 2021-10-02 ENCOUNTER — Encounter: Payer: Self-pay | Admitting: Family Medicine

## 2021-10-02 DIAGNOSIS — H1132 Conjunctival hemorrhage, left eye: Secondary | ICD-10-CM | POA: Insufficient documentation

## 2021-10-02 NOTE — Assessment & Plan Note (Addendum)
Report of recurrent generalized weakness, similar complaint in past 2 weeks, when re hospitaluzation for dehydration and AKI was required, advised ED eval

## 2021-10-02 NOTE — Assessment & Plan Note (Signed)
Acute bleed, no pain or loss of vision reported, monitor at home if new symptoms mentioned develop then ED eval

## 2021-10-03 ENCOUNTER — Encounter (HOSPITAL_COMMUNITY): Payer: Self-pay | Admitting: Emergency Medicine

## 2021-10-03 ENCOUNTER — Emergency Department (HOSPITAL_COMMUNITY): Payer: Medicare HMO

## 2021-10-03 ENCOUNTER — Telehealth: Payer: Self-pay

## 2021-10-03 ENCOUNTER — Other Ambulatory Visit: Payer: Self-pay

## 2021-10-03 ENCOUNTER — Inpatient Hospital Stay (HOSPITAL_COMMUNITY)
Admission: EM | Admit: 2021-10-03 | Discharge: 2021-10-05 | DRG: 071 | Disposition: A | Discharge status: Home Health | Payer: Medicare HMO | Attending: Internal Medicine | Admitting: Internal Medicine

## 2021-10-03 DIAGNOSIS — N179 Acute kidney failure, unspecified: Secondary | ICD-10-CM | POA: Diagnosis not present

## 2021-10-03 DIAGNOSIS — M797 Fibromyalgia: Secondary | ICD-10-CM | POA: Diagnosis present

## 2021-10-03 DIAGNOSIS — Z8673 Personal history of transient ischemic attack (TIA), and cerebral infarction without residual deficits: Secondary | ICD-10-CM | POA: Diagnosis not present

## 2021-10-03 DIAGNOSIS — W182XXA Fall in (into) shower or empty bathtub, initial encounter: Secondary | ICD-10-CM | POA: Diagnosis present

## 2021-10-03 DIAGNOSIS — Z880 Allergy status to penicillin: Secondary | ICD-10-CM

## 2021-10-03 DIAGNOSIS — I129 Hypertensive chronic kidney disease with stage 1 through stage 4 chronic kidney disease, or unspecified chronic kidney disease: Secondary | ICD-10-CM | POA: Diagnosis present

## 2021-10-03 DIAGNOSIS — F419 Anxiety disorder, unspecified: Secondary | ICD-10-CM | POA: Diagnosis present

## 2021-10-03 DIAGNOSIS — Y92009 Unspecified place in unspecified non-institutional (private) residence as the place of occurrence of the external cause: Secondary | ICD-10-CM | POA: Diagnosis not present

## 2021-10-03 DIAGNOSIS — F319 Bipolar disorder, unspecified: Secondary | ICD-10-CM | POA: Diagnosis present

## 2021-10-03 DIAGNOSIS — E1122 Type 2 diabetes mellitus with diabetic chronic kidney disease: Secondary | ICD-10-CM | POA: Diagnosis present

## 2021-10-03 DIAGNOSIS — Z20822 Contact with and (suspected) exposure to covid-19: Secondary | ICD-10-CM | POA: Diagnosis present

## 2021-10-03 DIAGNOSIS — F39 Unspecified mood [affective] disorder: Secondary | ICD-10-CM | POA: Diagnosis present

## 2021-10-03 DIAGNOSIS — Z79890 Hormone replacement therapy (postmenopausal): Secondary | ICD-10-CM | POA: Diagnosis not present

## 2021-10-03 DIAGNOSIS — Z9841 Cataract extraction status, right eye: Secondary | ICD-10-CM

## 2021-10-03 DIAGNOSIS — G4700 Insomnia, unspecified: Secondary | ICD-10-CM | POA: Diagnosis present

## 2021-10-03 DIAGNOSIS — R400 Somnolence: Secondary | ICD-10-CM | POA: Diagnosis not present

## 2021-10-03 DIAGNOSIS — Z888 Allergy status to other drugs, medicaments and biological substances status: Secondary | ICD-10-CM

## 2021-10-03 DIAGNOSIS — Z87891 Personal history of nicotine dependence: Secondary | ICD-10-CM

## 2021-10-03 DIAGNOSIS — E782 Mixed hyperlipidemia: Secondary | ICD-10-CM | POA: Diagnosis present

## 2021-10-03 DIAGNOSIS — G4710 Hypersomnia, unspecified: Secondary | ICD-10-CM | POA: Diagnosis present

## 2021-10-03 DIAGNOSIS — D638 Anemia in other chronic diseases classified elsewhere: Secondary | ICD-10-CM

## 2021-10-03 DIAGNOSIS — Z8249 Family history of ischemic heart disease and other diseases of the circulatory system: Secondary | ICD-10-CM

## 2021-10-03 DIAGNOSIS — I959 Hypotension, unspecified: Secondary | ICD-10-CM | POA: Diagnosis present

## 2021-10-03 DIAGNOSIS — J449 Chronic obstructive pulmonary disease, unspecified: Secondary | ICD-10-CM | POA: Diagnosis present

## 2021-10-03 DIAGNOSIS — I10 Essential (primary) hypertension: Secondary | ICD-10-CM | POA: Diagnosis present

## 2021-10-03 DIAGNOSIS — Z7983 Long term (current) use of bisphosphonates: Secondary | ICD-10-CM

## 2021-10-03 DIAGNOSIS — Z800 Family history of malignant neoplasm of digestive organs: Secondary | ICD-10-CM | POA: Diagnosis not present

## 2021-10-03 DIAGNOSIS — M810 Age-related osteoporosis without current pathological fracture: Secondary | ICD-10-CM | POA: Diagnosis present

## 2021-10-03 DIAGNOSIS — Z823 Family history of stroke: Secondary | ICD-10-CM

## 2021-10-03 DIAGNOSIS — K219 Gastro-esophageal reflux disease without esophagitis: Secondary | ICD-10-CM | POA: Diagnosis present

## 2021-10-03 DIAGNOSIS — E039 Hypothyroidism, unspecified: Secondary | ICD-10-CM | POA: Diagnosis present

## 2021-10-03 DIAGNOSIS — N1831 Chronic kidney disease, stage 3a: Secondary | ICD-10-CM | POA: Diagnosis not present

## 2021-10-03 DIAGNOSIS — Z9842 Cataract extraction status, left eye: Secondary | ICD-10-CM

## 2021-10-03 DIAGNOSIS — G9341 Metabolic encephalopathy: Secondary | ICD-10-CM | POA: Diagnosis not present

## 2021-10-03 DIAGNOSIS — Z90710 Acquired absence of both cervix and uterus: Secondary | ICD-10-CM

## 2021-10-03 DIAGNOSIS — R296 Repeated falls: Secondary | ICD-10-CM | POA: Diagnosis present

## 2021-10-03 DIAGNOSIS — Z794 Long term (current) use of insulin: Secondary | ICD-10-CM

## 2021-10-03 DIAGNOSIS — G4730 Sleep apnea, unspecified: Secondary | ICD-10-CM | POA: Diagnosis present

## 2021-10-03 DIAGNOSIS — E860 Dehydration: Secondary | ICD-10-CM | POA: Diagnosis not present

## 2021-10-03 DIAGNOSIS — Z9049 Acquired absence of other specified parts of digestive tract: Secondary | ICD-10-CM

## 2021-10-03 DIAGNOSIS — Z91011 Allergy to milk products: Secondary | ICD-10-CM

## 2021-10-03 DIAGNOSIS — R531 Weakness: Secondary | ICD-10-CM | POA: Diagnosis not present

## 2021-10-03 DIAGNOSIS — Z841 Family history of disorders of kidney and ureter: Secondary | ICD-10-CM

## 2021-10-03 DIAGNOSIS — D631 Anemia in chronic kidney disease: Secondary | ICD-10-CM | POA: Diagnosis not present

## 2021-10-03 DIAGNOSIS — Z7982 Long term (current) use of aspirin: Secondary | ICD-10-CM

## 2021-10-03 DIAGNOSIS — W19XXXA Unspecified fall, initial encounter: Secondary | ICD-10-CM

## 2021-10-03 DIAGNOSIS — Z833 Family history of diabetes mellitus: Secondary | ICD-10-CM

## 2021-10-03 DIAGNOSIS — Z79899 Other long term (current) drug therapy: Secondary | ICD-10-CM

## 2021-10-03 DIAGNOSIS — E119 Type 2 diabetes mellitus without complications: Secondary | ICD-10-CM

## 2021-10-03 LAB — CBC
HCT: 30.9 % — ABNORMAL LOW (ref 36.0–46.0)
Hemoglobin: 8.7 g/dL — ABNORMAL LOW (ref 12.0–15.0)
MCH: 22.7 pg — ABNORMAL LOW (ref 26.0–34.0)
MCHC: 28.2 g/dL — ABNORMAL LOW (ref 30.0–36.0)
MCV: 80.5 fL (ref 80.0–100.0)
Platelets: 265 10*3/uL (ref 150–400)
RBC: 3.84 MIL/uL — ABNORMAL LOW (ref 3.87–5.11)
RDW: 15.8 % — ABNORMAL HIGH (ref 11.5–15.5)
WBC: 9 10*3/uL (ref 4.0–10.5)
nRBC: 0 % (ref 0.0–0.2)

## 2021-10-03 LAB — URINALYSIS, ROUTINE W REFLEX MICROSCOPIC
Bilirubin Urine: NEGATIVE
Glucose, UA: NEGATIVE mg/dL
Hgb urine dipstick: NEGATIVE
Ketones, ur: NEGATIVE mg/dL
Nitrite: NEGATIVE
Protein, ur: 30 mg/dL — AB
Specific Gravity, Urine: 1.02 (ref 1.005–1.030)
pH: 5 (ref 5.0–8.0)

## 2021-10-03 LAB — BLOOD GAS, VENOUS
Acid-base deficit: 2.9 mmol/L — ABNORMAL HIGH (ref 0.0–2.0)
Bicarbonate: 23.2 mmol/L (ref 20.0–28.0)
Drawn by: 6000
O2 Saturation: 66.5 %
Patient temperature: 36.9
pCO2, Ven: 45 mmHg (ref 44–60)
pH, Ven: 7.32 (ref 7.25–7.43)
pO2, Ven: 44 mmHg (ref 32–45)

## 2021-10-03 LAB — BASIC METABOLIC PANEL
Anion gap: 6 (ref 5–15)
BUN: 37 mg/dL — ABNORMAL HIGH (ref 8–23)
CO2: 22 mmol/L (ref 22–32)
Calcium: 9.1 mg/dL (ref 8.9–10.3)
Chloride: 113 mmol/L — ABNORMAL HIGH (ref 98–111)
Creatinine, Ser: 1.74 mg/dL — ABNORMAL HIGH (ref 0.44–1.00)
GFR, Estimated: 31 mL/min — ABNORMAL LOW (ref >60)
Glucose, Bld: 102 mg/dL — ABNORMAL HIGH (ref 70–99)
Potassium: 3.6 mmol/L (ref 3.5–5.1)
Sodium: 141 mmol/L (ref 135–145)

## 2021-10-03 LAB — TROPONIN I (HIGH SENSITIVITY)
Troponin I (High Sensitivity): 5 ng/L (ref <18)
Troponin I (High Sensitivity): 5 ng/L (ref <18)

## 2021-10-03 LAB — CBG MONITORING, ED: Glucose-Capillary: 151 mg/dL — ABNORMAL HIGH (ref 70–99)

## 2021-10-03 LAB — CK: Total CK: 270 U/L — ABNORMAL HIGH (ref 38–234)

## 2021-10-03 LAB — GLUCOSE, CAPILLARY: Glucose-Capillary: 179 mg/dL — ABNORMAL HIGH (ref 70–99)

## 2021-10-03 MED ORDER — HEPARIN SODIUM (PORCINE) 5000 UNIT/ML IJ SOLN
5000.0000 [IU] | Freq: Three times a day (TID) | INTRAMUSCULAR | Status: DC
  Administered 2021-10-03 – 2021-10-05 (×6): 5000 [IU] via SUBCUTANEOUS
  Filled 2021-10-03 (×5): qty 1

## 2021-10-03 MED ORDER — SERTRALINE HCL 50 MG PO TABS: 100.0000 mg | ORAL_TABLET | ORAL | Status: DC

## 2021-10-03 MED ORDER — INSULIN ASPART 100 UNIT/ML IJ SOLN: 0.0000 [IU] | Freq: Every day | INTRAMUSCULAR | Status: DC

## 2021-10-03 MED ORDER — THIAMINE HCL 100 MG/ML IJ SOLN
INTRAMUSCULAR | Status: AC
  Filled 2021-10-03: qty 6

## 2021-10-03 MED ORDER — ONDANSETRON HCL 4 MG/2ML IJ SOLN: 4.0000 mg | Freq: Four times a day (QID) | INTRAMUSCULAR | Status: DC | PRN

## 2021-10-03 MED ORDER — THIAMINE HCL 100 MG/ML IJ SOLN
500.0000 mg | Freq: Every day | INTRAVENOUS | Status: DC
  Administered 2021-10-04 – 2021-10-05 (×3): 500 mg via INTRAVENOUS
  Filled 2021-10-03 (×4): qty 5

## 2021-10-03 MED ORDER — INSULIN ASPART 100 UNIT/ML IJ SOLN
0.0000 [IU] | Freq: Three times a day (TID) | INTRAMUSCULAR | Status: DC
  Administered 2021-10-04: 2 [IU] via SUBCUTANEOUS
  Administered 2021-10-04: 5 [IU] via SUBCUTANEOUS
  Administered 2021-10-05: 3 [IU] via SUBCUTANEOUS

## 2021-10-03 MED ORDER — SODIUM CHLORIDE 0.9 % IV BOLUS
500.0000 mL | Freq: Once | INTRAVENOUS | Status: AC
  Administered 2021-10-03: 500 mL via INTRAVENOUS

## 2021-10-03 MED ORDER — TRAZODONE HCL 50 MG PO TABS
150.0000 mg | ORAL_TABLET | Freq: Every day | ORAL | Status: DC
  Administered 2021-10-03: 150 mg via ORAL

## 2021-10-03 MED ORDER — BUPROPION HCL ER (XL) 150 MG PO TB24
150.0000 mg | ORAL_TABLET | ORAL | Status: DC
  Administered 2021-10-04 – 2021-10-05 (×2): 150 mg via ORAL
  Filled 2021-10-03 (×2): qty 1

## 2021-10-03 MED ORDER — ASPIRIN 81 MG PO TBEC
81.0000 mg | DELAYED_RELEASE_TABLET | Freq: Every day | ORAL | Status: DC
  Administered 2021-10-04 – 2021-10-05 (×2): 81 mg via ORAL
  Filled 2021-10-03 (×2): qty 1

## 2021-10-03 MED ORDER — SODIUM CHLORIDE 0.9 % IV SOLN
INTRAVENOUS | Status: DC
Start: 2021-10-03 — End: 2021-10-05

## 2021-10-03 MED ORDER — MORPHINE SULFATE (PF) 2 MG/ML IV SOLN: 2.0000 mg | INTRAVENOUS | Status: DC | PRN

## 2021-10-03 MED ORDER — ONDANSETRON HCL 4 MG PO TABS: 4.0000 mg | ORAL_TABLET | Freq: Four times a day (QID) | ORAL | Status: DC | PRN

## 2021-10-03 MED ORDER — RISPERIDONE 0.5 MG PO TABS
0.5000 mg | ORAL_TABLET | Freq: Every day | ORAL | Status: DC
  Administered 2021-10-03: 0.5 mg via ORAL

## 2021-10-03 MED ORDER — LAMOTRIGINE 100 MG PO TABS
100.0000 mg | ORAL_TABLET | Freq: Two times a day (BID) | ORAL | Status: DC
  Administered 2021-10-03 – 2021-10-05 (×4): 100 mg via ORAL
  Filled 2021-10-03 (×3): qty 1

## 2021-10-03 MED ORDER — OXYCODONE HCL 5 MG PO TABS
5.0000 mg | ORAL_TABLET | ORAL | Status: DC | PRN
  Administered 2021-10-04: 5 mg via ORAL
  Filled 2021-10-03: qty 1

## 2021-10-03 MED ORDER — PREGABALIN 75 MG PO CAPS: 75.0000 mg | ORAL_CAPSULE | Freq: Every day | ORAL | Status: DC

## 2021-10-03 MED ORDER — ACETAMINOPHEN 650 MG RE SUPP: 650.0000 mg | Freq: Four times a day (QID) | RECTAL | Status: DC | PRN

## 2021-10-03 MED ORDER — LEVOTHYROXINE SODIUM 50 MCG PO TABS
50.0000 ug | ORAL_TABLET | Freq: Every day | ORAL | Status: DC
  Administered 2021-10-04 – 2021-10-05 (×2): 50 ug via ORAL
  Filled 2021-10-03 (×2): qty 1

## 2021-10-03 MED ORDER — MONTELUKAST SODIUM 10 MG PO TABS
10.0000 mg | ORAL_TABLET | Freq: Every day | ORAL | Status: DC
  Administered 2021-10-03 – 2021-10-04 (×2): 10 mg via ORAL
  Filled 2021-10-03: qty 1

## 2021-10-03 MED ORDER — LORAZEPAM 0.5 MG PO TABS
0.5000 mg | ORAL_TABLET | Freq: Two times a day (BID) | ORAL | Status: DC
  Administered 2021-10-03: 0.5 mg via ORAL

## 2021-10-03 MED ORDER — MIRABEGRON ER 25 MG PO TB24: 25.0000 mg | ORAL_TABLET | Freq: Every day | ORAL | Status: DC

## 2021-10-03 MED ORDER — MIRABEGRON ER 25 MG PO TB24
25.0000 mg | ORAL_TABLET | Freq: Every day | ORAL | Status: DC
  Administered 2021-10-04 – 2021-10-05 (×2): 25 mg via ORAL
  Filled 2021-10-03 (×2): qty 1

## 2021-10-03 MED ORDER — METOPROLOL TARTRATE 50 MG PO TABS
50.0000 mg | ORAL_TABLET | Freq: Two times a day (BID) | ORAL | Status: DC
  Administered 2021-10-03 – 2021-10-05 (×4): 50 mg via ORAL
  Filled 2021-10-03 (×3): qty 1

## 2021-10-03 MED ORDER — ACETAMINOPHEN 325 MG PO TABS
650.0000 mg | ORAL_TABLET | Freq: Four times a day (QID) | ORAL | Status: DC | PRN
  Administered 2021-10-05: 650 mg via ORAL
  Filled 2021-10-03: qty 2

## 2021-10-03 MED ORDER — EZETIMIBE 10 MG PO TABS
10.0000 mg | ORAL_TABLET | Freq: Every day | ORAL | Status: DC
Start: 2021-10-04 — End: 2021-10-05
  Administered 2021-10-04 – 2021-10-05 (×2): 10 mg via ORAL
  Filled 2021-10-03 (×2): qty 1

## 2021-10-03 MED ORDER — EZETIMIBE 10 MG PO TABS: 10.0000 mg | ORAL_TABLET | Freq: Every day | ORAL | Status: DC

## 2021-10-03 NOTE — ED Provider Notes (Signed)
[facility] EMERGENCY DEPARTMENT Provider Note   CSN: [id number] Arrival date & time: 10/03/21  1312     History {Add pertinent medical, surgical, social history, OB history to HPI:1} Chief Complaint  Patient presents with   Weakness    [name] is a 71 y.o. female.  She is brought in from home by her caregiver.  She has been alone over the weekend.  Caregiver said she found her having fallen in the bathroom.  She said she has been more lethargic since leaving the hospital but definitely worse since Friday.  She has been alone over the weekend.  Patient complaining of chronic issues including her back and her neck and her knees.  She does not think she has been injured from the fall.  She denies any acute complaints.  Primary care doctor was concerned that her potassium and her renal function may be off as this has been the reason for her last few admissions including about a week ago.  The history is provided by the patient and a caregiver.  Weakness Severity:  Moderate Onset quality:  Gradual Duration:  4 days Timing:  Constant Progression:  Unchanged Chronicity:  Recurrent Relieved by:  None tried Worsened by:  Activity Ineffective treatments:  None tried Associated symptoms: difficulty walking, falls, lethargy and nausea   Associated symptoms: no abdominal pain, no chest pain, no cough, no diarrhea, no dysuria, no fever, no foul-smelling urine, no shortness of breath and no vomiting        Home Medications Prior to Admission medications   Medication Sig Start Date End Date Taking? Authorizing Provider  ACCU-CHEK GUIDE test strip TEST BLOOD SUGAR FOUR TIMES DAILY  AND AS NEEDED 05/30/21   Philemon Kingdom, MD  Accu-Chek Softclix Lancets lancets TEST BLOOD SUGAR THREE TIMES DAILY AS DIRECTED 03/28/21   Philemon Kingdom, MD  albuterol (VENTOLIN HFA) 108 (90 Base) MCG/ACT inhaler INHALE 1 TO 2 PUFFS EVERY 6 HOURS AS NEEDED FOR WHEEZING, SHORTNESS OF BREATH Patient taking  differently: Inhale 1-2 puffs into the lungs every 6 (six) hours as needed for wheezing or shortness of breath. 06/24/21   Deneise Lever, MD  Alcohol Swabs (DROPSAFE ALCOHOL PREP) 70 % PADS USE TO CLEAN FINGER PRIOR TO TESTING FOR BLOOD SUGAR  AS DIRECTED 05/20/21   Philemon Kingdom, MD  alendronate (FOSAMAX) 70 MG tablet TAKE 1 TABLET (70 MG TOTAL) BY MOUTH EVERY 7 (SEVEN) DAYS. TAKE WITH A FULL GLASS OF WATER ON AN EMPTY STOMACH. 02/04/21   [name], MD  amLODipine (NORVASC) 10 MG tablet TAKE 1 TABLET EVERY DAY Patient taking differently: Take 10 mg by mouth daily. 06/06/21   Imogene Burn, PA-C  ascorbic acid (VITAMIN C) 500 MG tablet Take 500 mg by mouth daily.    [provider]  aspirin EC 81 MG tablet Take 1 tablet (81 mg total) by mouth daily with breakfast. 12/08/18   Roxan Hockey, MD  Blood Glucose Calibration (ACCU-CHEK GUIDE CONTROL) LIQD USE AS DIRECTED 12/13/20   Philemon Kingdom, MD  blood glucose meter kit and supplies Dispense based on patient and insurance preference. Use up to four times daily as directed. (FOR ICD-10 E10.9, E11.9). 06/27/19   Philemon Kingdom, MD  buPROPion (WELLBUTRIN XL) 150 MG 24 hr tablet Take 1 tablet (150 mg total) by mouth every morning. 07/12/21 07/12/22  Cloria Spring, MD  Calcium Carb-Cholecalciferol (CALTRATE 600+D3 SOFT) 600-20 MG-MCG CHEW Chew 1 tablet by mouth daily at 12 noon.  [provider]  cetirizine (ZYRTEC) 10 MG tablet Take 1 tablet (10 mg total) by mouth daily. 04/25/21   Renee Rival, FNP  Cholecalciferol (D3 PO) Take 1 tablet by mouth daily.    [provider]  Clobetasol Prop Emollient Base (CLOBETASOL PROPIONATE E) 0.05 % emollient cream Apply to affected area qd Patient taking differently: Apply 1 Application topically daily. 01/24/21   Lavonna Monarch, MD  Continuous Blood Gluc Sensor (FREESTYLE LIBRE 14 DAY SENSOR) MISC 1 each by Does not apply route every 14 (fourteen) days. Change  every 2 weeks 06/24/19   Philemon Kingdom, MD  diclofenac Sodium (VOLTAREN) 1 % GEL APPLY 2 GRAMS TOPICALLY FOUR TIMES DAILY. Patient taking differently: Apply 2 g topically 4 (four) times daily. 06/20/21   Esterwood, Amy S, PA-C  Docusate Sodium (DSS) 100 MG CAPS Take 1 tablet by mouth in the morning and at bedtime.    [provider]  DROPLET PEN NEEDLES 31G X 8 MM MISC USE FOR INJECTING INSULIN 4 TIMES DAILY. 09/02/21   Philemon Kingdom, MD  ezetimibe (ZETIA) 10 MG tablet TAKE 1 TABLET EVERY DAY Patient taking differently: Take 10 mg by mouth daily. 06/22/21   [name], MD  insulin aspart (NOVOLOG FLEXPEN) 100 UNIT/ML FlexPen Inject 5 Units into the skin 3 (three) times daily with meals. Give if eats 50% or more of meal. 09/17/21   Johnson, Clanford L, MD  insulin glargine, 2 Unit Dial, (TOUJEO MAX SOLOSTAR) 300 UNIT/ML Solostar Pen Inject 20-25 Units into the skin daily. 09/29/21   Philemon Kingdom, MD  lamoTRIgine (LAMICTAL) 100 MG tablet Take 1 tablet (100 mg total) by mouth 2 (two) times daily. 07/12/21   Cloria Spring, MD  levothyroxine (SYNTHROID) 50 MCG tablet TAKE 1 TABLET EVERY DAY FOR 6 DAYS PER WEEK, 1/2 TABLET 1 DAY PER WEEK 07/07/21   Philemon Kingdom, MD  LORazepam (ATIVAN) 0.5 MG tablet Take 1 tablet (0.5 mg total) by mouth 2 (two) times daily. 07/12/21   Cloria Spring, MD  meclizine (ANTIVERT) 25 MG tablet Take 1 tablet (25 mg total) by mouth 3 (three) times daily as needed for dizziness. 04/28/20   Noreene Larsson, NP  metoprolol tartrate (LOPRESSOR) 50 MG tablet TAKE 1 TABLET TWICE DAILY (NEED MD APPOINTMENT) Patient taking differently: Take 50 mg by mouth 2 (two) times daily. 08/29/21   Strader, Fransisco Hertz, PA-C  mirabegron ER (MYRBETRIQ) 25 MG TB24 tablet Take 1 tablet (25 mg total) by mouth daily. 03/21/21   [name], MD  Misc. Devices (MATTRESS PAD) MISC FIRM MATTRESS PAD for hospital bed x 1 02/22/21   [name], MD  montelukast (SINGULAIR)  10 MG tablet TAKE 1 TABLET EVERY DAY Patient taking differently: Take 10 mg by mouth at bedtime. 05/20/21   [name], MD  Multiple Vitamins-Minerals (MULTIVITAMIN GUMMIES ADULT PO) Take 1 tablet by mouth daily.    [provider]  nystatin (MYCOSTATIN/NYSTOP) powder APPLY TO AFFECTED AREA 4 TIMES DAILY. Patient taking differently: Apply 1 Application topically in the morning, at noon, in the evening, and at bedtime. 09/12/21   [name], MD  Omega-3 Fatty Acids (FISH OIL PO) Take 1 capsule by mouth daily.    [provider]  ondansetron (ZOFRAN) 4 MG tablet Take 1 tablet (4 mg total) by mouth every 8 (eight) hours as needed for nausea or vomiting. 07/19/21   Alvira Monday, FNP  oxyCODONE-acetaminophen (PERCOCET) 10-325 MG tablet Take 1 tablet by  mouth every 8 (eight) hours as needed for pain. 11/01/20   [provider]  pregabalin (LYRICA) 75 MG capsule Take 1 capsule (75 mg total) by mouth daily. 10/20/16   Rexene Alberts, MD  RABEprazole (ACIPHEX) 20 MG tablet Take 1 tablet (20 mg total) by mouth 2 (two) times daily. 06/29/21   Pyrtle, Lajuan Lines, MD  RESTASIS 0.05 % ophthalmic emulsion Place 2 drops into both eyes daily. 07/29/20   [provider]  risperiDONE (RISPERDAL) 0.5 MG tablet Take 1 tablet (0.5 mg total) by mouth at bedtime. 07/12/21   Cloria Spring, MD  sertraline (ZOLOFT) 100 MG tablet Take 1 tablet (100 mg total) by mouth every morning. 07/12/21   Cloria Spring, MD  STIOLTO RESPIMAT 2.5-2.5 MCG/ACT AERS INHALE 2 PUFFS INTO THE LUNGS DAILY. Patient taking differently: Inhale 2 puffs into the lungs daily. 05/31/20   Parrett, Fonnie Mu, NP  traZODone (DESYREL) 150 MG tablet Take 1 tablet (150 mg total) by mouth at bedtime. 07/12/21   Cloria Spring, MD      Allergies    Iron, Milk (cow), Penicillins, Phenazopyridine, Phenazopyridine hcl, Cephalexin, Flonase [fluticasone], Milk-related compounds, and Phenazopyridine hcl    Review of Systems    Review of Systems  Constitutional:  Negative for fever.  HENT:  Negative for sore throat.   Respiratory:  Negative for cough and shortness of breath.   Cardiovascular:  Negative for chest pain.  Gastrointestinal:  Positive for nausea. Negative for abdominal pain, diarrhea and vomiting.  Genitourinary:  Negative for dysuria.  Musculoskeletal:  Positive for back pain, falls and gait problem.  Skin:  Negative for rash.  Neurological:  Positive for weakness.    Physical Exam Updated Vital Signs BP (!) 111/49 (BP Location: Right Arm)   Pulse 62   Temp 98.3 F (36.8 C) (Oral)   Resp 18   Ht $R'4\' 11"'gK$  (1.499 m)   Wt 72.6 kg   SpO2 96%   BMI 32.32 kg/m  Physical Exam Vitals and nursing note reviewed.  Constitutional:      General: She is not in acute distress.    Appearance: Normal appearance. She is well-developed.  HENT:     Head: Normocephalic and atraumatic.  Eyes:     Conjunctiva/sclera: Conjunctivae normal.  Cardiovascular:     Rate and Rhythm: Normal rate and regular rhythm.     Heart sounds: No murmur heard. Pulmonary:     Effort: Pulmonary effort is normal. No respiratory distress.     Breath sounds: Normal breath sounds.  Abdominal:     Palpations: Abdomen is soft.     Tenderness: There is no abdominal tenderness. There is no guarding or rebound.  Musculoskeletal:        General: Normal range of motion.     Cervical back: Neck supple.     Right lower leg: No edema.     Left lower leg: No edema.  Skin:    General: Skin is warm and dry.     Capillary Refill: Capillary refill takes less than 2 seconds.  Neurological:     General: No focal deficit present.     Mental Status: She is oriented to person, place, and time.     Sensory: No sensory deficit.     Motor: No weakness.     ED Results / Procedures / Treatments   Labs (all labs ordered are listed, but only abnormal results are displayed) Labs Reviewed  BASIC METABOLIC PANEL - Abnormal; Notable for  the  following components:      Result Value   Chloride 113 (*)    Glucose, Bld 102 (*)    BUN 37 (*)    Creatinine, Ser 1.74 (*)    GFR, Estimated 31 (*)    All other components within normal limits  CBC - Abnormal; Notable for the following components:   RBC 3.84 (*)    Hemoglobin 8.7 (*)    HCT 30.9 (*)    MCH 22.7 (*)    MCHC 28.2 (*)    RDW 15.8 (*)    All other components within normal limits  CBG MONITORING, ED - Abnormal; Notable for the following components:   Glucose-Capillary 151 (*)    All other components within normal limits  URINALYSIS, ROUTINE W REFLEX MICROSCOPIC    EKG EKG Interpretation  Date/Time:  Monday October 03 2021 14:07:43 EDT Ventricular Rate:  60 PR Interval:  192 QRS Duration: 90 QT Interval:  386 QTC Calculation: 386 R Axis:   63 Text Interpretation: Sinus rhythm Abnormal R-wave progression, early transition Baseline wander in lead(s) V5 No significant change since prior 7/23 Confirmed by Aletta Edouard [phone]) on 10/03/2021 3:02:28 PM  Radiology No results found.  Procedures Procedures  {Document cardiac monitor, telemetry assessment procedure when appropriate:1}  Medications Ordered in ED Medications  sodium chloride 0.9 % bolus 500 mL (has no administration in time range)    ED Course/ Medical Decision Making/ A&P                           Medical Decision Making Amount and/or Complexity of Data Reviewed Labs: ordered. Radiology: ordered.  This patient complains of ***; this involves an extensive number of treatment Options and is a complaint that carries with it a high risk of complications and morbidity. The differential includes ***  I ordered, reviewed and interpreted labs, which included *** I ordered medication *** and reviewed PMP when indicated. I ordered imaging studies which included *** and I independently    visualized and interpreted imaging which showed *** Additional history obtained from *** Previous records  obtained and reviewed *** I consulted *** and discussed lab and imaging findings and discussed disposition.  Cardiac monitoring reviewed, *** Social determinants considered, *** Critical Interventions: ***  After the interventions stated above, I reevaluated the patient and found *** Admission and further testing considered, ***    {Document critical care time when appropriate:1} {Document review of labs and clinical decision tools ie heart score, Chads2Vasc2 etc:1}  {Document your independent review of radiology images, and any outside records:1} {Document your discussion with family members, caretakers, and with consultants:1} {Document social determinants of health affecting pt's care:1} {Document your decision making why or why not admission, treatments were needed:1} Final Clinical Impression(s) / ED Diagnoses Final diagnoses:  None    Rx / DC Orders ED Discharge Orders     None

## 2021-10-03 NOTE — Telephone Encounter (Signed)
Patient called and the aid got on the phone and said could not get into the house, had to go through the bathroom window to get in, patient was in the bath tub and was very weak to get up out of the tub.  Aid asking what should they do, patient is so weak.    Patient called said she was told to get come in and get some blood work done for Dr Moshe Cipro, no order has been entered.  Aid has asked for a return call ASAP.  Patient call back # [phone]

## 2021-10-03 NOTE — ED Notes (Signed)
This NT attempted to ambulate pt, Pt was falling asleep while this NT was explaining the procedure. This NT attempted to sit pt up in the bed, the pt did not have enough strength to sit up in the bed. The pt family member and the pt was not comfortable with getting the pt up and walking.

## 2021-10-03 NOTE — ED Notes (Signed)
Pt's niece, Oris Drone, called inquiring about pt's disposition. Update given

## 2021-10-03 NOTE — ED Notes (Signed)
Upon entering room, pt was finishing up a tenderloin biscuit from an outside source. Pt's private sitter is at bedside assisting pt's needs. Update on pt's disposition given

## 2021-10-03 NOTE — ED Notes (Signed)
This RN called the lab to discuss the venous blood gas results, per the technician the result clotted and was credited and a new order was placed, lab is on the way to recollect, MD notified

## 2021-10-03 NOTE — ED Triage Notes (Signed)
Per caregiver, pt is having generalized weakness, pt fell trying to weigh herself and fell in bathroom, found by caregiver around 9 am.  Pt lives alone, drowsy in triage, but able to answer questions.

## 2021-10-03 NOTE — Telephone Encounter (Signed)
Will take her back to the ER today asap for evaluation

## 2021-10-04 ENCOUNTER — Inpatient Hospital Stay: Payer: Medicare HMO | Admitting: Family Medicine

## 2021-10-04 DIAGNOSIS — I959 Hypotension, unspecified: Secondary | ICD-10-CM | POA: Diagnosis present

## 2021-10-04 DIAGNOSIS — E039 Hypothyroidism, unspecified: Secondary | ICD-10-CM | POA: Diagnosis present

## 2021-10-04 DIAGNOSIS — Z800 Family history of malignant neoplasm of digestive organs: Secondary | ICD-10-CM | POA: Diagnosis not present

## 2021-10-04 DIAGNOSIS — D631 Anemia in chronic kidney disease: Secondary | ICD-10-CM | POA: Diagnosis present

## 2021-10-04 DIAGNOSIS — Z8673 Personal history of transient ischemic attack (TIA), and cerebral infarction without residual deficits: Secondary | ICD-10-CM | POA: Diagnosis not present

## 2021-10-04 DIAGNOSIS — R296 Repeated falls: Secondary | ICD-10-CM | POA: Diagnosis present

## 2021-10-04 DIAGNOSIS — E782 Mixed hyperlipidemia: Secondary | ICD-10-CM | POA: Diagnosis present

## 2021-10-04 DIAGNOSIS — K219 Gastro-esophageal reflux disease without esophagitis: Secondary | ICD-10-CM | POA: Diagnosis present

## 2021-10-04 DIAGNOSIS — N179 Acute kidney failure, unspecified: Secondary | ICD-10-CM | POA: Diagnosis present

## 2021-10-04 DIAGNOSIS — R531 Weakness: Secondary | ICD-10-CM | POA: Diagnosis present

## 2021-10-04 DIAGNOSIS — W182XXA Fall in (into) shower or empty bathtub, initial encounter: Secondary | ICD-10-CM | POA: Diagnosis present

## 2021-10-04 DIAGNOSIS — M810 Age-related osteoporosis without current pathological fracture: Secondary | ICD-10-CM | POA: Diagnosis present

## 2021-10-04 DIAGNOSIS — D638 Anemia in other chronic diseases classified elsewhere: Secondary | ICD-10-CM | POA: Diagnosis not present

## 2021-10-04 DIAGNOSIS — F419 Anxiety disorder, unspecified: Secondary | ICD-10-CM | POA: Diagnosis present

## 2021-10-04 DIAGNOSIS — N1831 Chronic kidney disease, stage 3a: Secondary | ICD-10-CM | POA: Diagnosis present

## 2021-10-04 DIAGNOSIS — G4700 Insomnia, unspecified: Secondary | ICD-10-CM | POA: Diagnosis present

## 2021-10-04 DIAGNOSIS — J449 Chronic obstructive pulmonary disease, unspecified: Secondary | ICD-10-CM | POA: Diagnosis present

## 2021-10-04 DIAGNOSIS — E1122 Type 2 diabetes mellitus with diabetic chronic kidney disease: Secondary | ICD-10-CM | POA: Diagnosis present

## 2021-10-04 DIAGNOSIS — I129 Hypertensive chronic kidney disease with stage 1 through stage 4 chronic kidney disease, or unspecified chronic kidney disease: Secondary | ICD-10-CM | POA: Diagnosis present

## 2021-10-04 DIAGNOSIS — Z20822 Contact with and (suspected) exposure to covid-19: Secondary | ICD-10-CM | POA: Diagnosis present

## 2021-10-04 DIAGNOSIS — G4710 Hypersomnia, unspecified: Secondary | ICD-10-CM | POA: Diagnosis present

## 2021-10-04 DIAGNOSIS — Y92009 Unspecified place in unspecified non-institutional (private) residence as the place of occurrence of the external cause: Secondary | ICD-10-CM | POA: Diagnosis not present

## 2021-10-04 DIAGNOSIS — F319 Bipolar disorder, unspecified: Secondary | ICD-10-CM | POA: Diagnosis present

## 2021-10-04 DIAGNOSIS — Z79890 Hormone replacement therapy (postmenopausal): Secondary | ICD-10-CM | POA: Diagnosis not present

## 2021-10-04 DIAGNOSIS — G9341 Metabolic encephalopathy: Secondary | ICD-10-CM | POA: Diagnosis present

## 2021-10-04 DIAGNOSIS — E860 Dehydration: Secondary | ICD-10-CM | POA: Diagnosis present

## 2021-10-04 DIAGNOSIS — M797 Fibromyalgia: Secondary | ICD-10-CM | POA: Diagnosis present

## 2021-10-04 LAB — CBC WITH DIFFERENTIAL/PLATELET
Abs Immature Granulocytes: 0.05 10*3/uL (ref 0.00–0.07)
Basophils Absolute: 0 10*3/uL (ref 0.0–0.1)
Basophils Relative: 0 %
Eosinophils Absolute: 0.2 10*3/uL (ref 0.0–0.5)
Eosinophils Relative: 3 %
HCT: 27.8 % — ABNORMAL LOW (ref 36.0–46.0)
Hemoglobin: 7.9 g/dL — ABNORMAL LOW (ref 12.0–15.0)
Immature Granulocytes: 1 %
Lymphocytes Relative: 37 %
Lymphs Abs: 2.1 10*3/uL (ref 0.7–4.0)
MCH: 22.8 pg — ABNORMAL LOW (ref 26.0–34.0)
MCHC: 28.4 g/dL — ABNORMAL LOW (ref 30.0–36.0)
MCV: 80.3 fL (ref 80.0–100.0)
Monocytes Absolute: 0.7 10*3/uL (ref 0.1–1.0)
Monocytes Relative: 12 %
Neutro Abs: 2.7 10*3/uL (ref 1.7–7.7)
Neutrophils Relative %: 47 %
Platelets: 227 10*3/uL (ref 150–400)
RBC: 3.46 MIL/uL — ABNORMAL LOW (ref 3.87–5.11)
RDW: 15.6 % — ABNORMAL HIGH (ref 11.5–15.5)
WBC: 5.7 10*3/uL (ref 4.0–10.5)
nRBC: 0 % (ref 0.0–0.2)

## 2021-10-04 LAB — COMPREHENSIVE METABOLIC PANEL
ALT: 20 U/L (ref 0–44)
AST: 16 U/L (ref 15–41)
Albumin: 3 g/dL — ABNORMAL LOW (ref 3.5–5.0)
Alkaline Phosphatase: 62 U/L (ref 38–126)
Anion gap: 5 (ref 5–15)
BUN: 39 mg/dL — ABNORMAL HIGH (ref 8–23)
CO2: 22 mmol/L (ref 22–32)
Calcium: 8.4 mg/dL — ABNORMAL LOW (ref 8.9–10.3)
Chloride: 116 mmol/L — ABNORMAL HIGH (ref 98–111)
Creatinine, Ser: 1.79 mg/dL — ABNORMAL HIGH (ref 0.44–1.00)
GFR, Estimated: 30 mL/min — ABNORMAL LOW (ref >60)
Glucose, Bld: 117 mg/dL — ABNORMAL HIGH (ref 70–99)
Potassium: 3.7 mmol/L (ref 3.5–5.1)
Sodium: 143 mmol/L (ref 135–145)
Total Bilirubin: 0.4 mg/dL (ref 0.3–1.2)
Total Protein: 5.7 g/dL — ABNORMAL LOW (ref 6.5–8.1)

## 2021-10-04 LAB — GLUCOSE, CAPILLARY
Glucose-Capillary: 142 mg/dL — ABNORMAL HIGH (ref 70–99)
Glucose-Capillary: 203 mg/dL — ABNORMAL HIGH (ref 70–99)
Glucose-Capillary: 92 mg/dL (ref 70–99)
Glucose-Capillary: 102 mg/dL — ABNORMAL HIGH (ref 70–99)

## 2021-10-04 LAB — IRON AND TIBC
Iron: 39 ug/dL (ref 28–170)
Saturation Ratios: 15 % (ref 10.4–31.8)
TIBC: 252 ug/dL (ref 250–450)
UIBC: 213 ug/dL

## 2021-10-04 LAB — TSH: TSH: 0.494 u[IU]/mL (ref 0.350–4.500)

## 2021-10-04 LAB — VITAMIN B12: Vitamin B-12: 664 pg/mL (ref 180–914)

## 2021-10-04 LAB — MAGNESIUM: Magnesium: 2.1 mg/dL (ref 1.7–2.4)

## 2021-10-04 LAB — VITAMIN D 25 HYDROXY (VIT D DEFICIENCY, FRACTURES): Vit D, 25-Hydroxy: 41.07 ng/mL (ref 30–100)

## 2021-10-04 MED ORDER — MENTHOL 3 MG MT LOZG
1.0000 | LOZENGE | OROMUCOSAL | Status: DC | PRN
Start: 2021-10-04 — End: 2021-10-05
  Administered 2021-10-04: 3 mg via ORAL
  Filled 2021-10-04 (×2): qty 9

## 2021-10-04 MED ORDER — PHENOL 1.4 % MT LIQD
1.0000 | OROMUCOSAL | Status: DC | PRN
  Administered 2021-10-04 – 2021-10-05 (×2): 1 via OROMUCOSAL
  Filled 2021-10-04: qty 177

## 2021-10-04 NOTE — Progress Notes (Signed)
PROGRESS NOTE    Patient: [name]                            PCP: [name], MD                    DOB: 1950-07-24            DOA: 10/03/2021 [id number]             DOS: 10/04/2021, 11:47 AM   LOS: 0 days   Date of Service: The patient was seen and examined on 10/04/2021  Subjective:   The patient was seen and examined this morning. Hypotensive, somnolent, but arousable complaining of severe generalized weakness, nonspecific generalized pain. Unable to keep her eyes open--follows some commands.  But cooperative.  Brief Narrative:   [name] is a 71 y.o. female with medical history significant of anemia, anxiety, bipolar disorder, cancer, COPD, GERD, hyperlipidemia, hypertension, hypothyroidism, seizures, paralysis, stroke, and more presents ED with a chief complaint of generalized weakness.  Patient has home health, who found her in the bathtub.  Apparently she was trying to something in the bathroom when she fell backwards into the bathtub and was too weak to get up.  Patient's home health seems to think that patient has been declining since her last admission.  Patient herself, is not able to give any history.  She is hypersomnolent.  She opens her eyes to light touch, but will not speak and goes back to sleep.  She is protecting her airway.  No further history could be obtained.    ED Temp 98.3, heart rate 57-62, respiratory rate 17-21, blood pressure 101/47-125/55, satting at 97% pH 7.3, PO2 44 No leukocytosis Chemistry shows a BUN of 37 and a creatinine 1.74 CPK 270 Trope 5 UA is not indicative of UTI CT head has chronic changes but no acute intracranial abnormality Chest x-ray shows no active disease EKG shows a heart rate of 60, sinus rhythm, QTc 386 Patient was given 500 normal bolus of normal saline Admission requested for acute metabolic encephalopathy and generalized weakness      Assessment & Plan:   Principal Problem:   Generalized  weakness Active Problems:   Mood disorder (HCC)   Mixed hyperlipidemia   Essential hypertension   DMII (diabetes mellitus, type 2) (HCC)   Recurrent falls   Acquired hypothyroidism   Acute metabolic encephalopathy   Anemia of chronic disease   Acute renal failure superimposed on stage 3a chronic kidney disease (HCC)     Assessment and Plan: * Generalized weakness - Generalized weakness with recurrent falls - TSH, B12, vitamin D >>> all within normal limits  -Start high-dose thiamine -PT eval and treat -Continue to monitor  Mood disorder (HCC) -Continue Wellbutrin, - currently holding sedating medications given patient's altered mental status including Ativan, Risperdal, trazodone -Continue Lamictal for mood stabilization  Acute renal failure superimposed on stage 3a chronic kidney disease (HCC) - Creatinine increased to 1.74 from 1.2 at last discharge -Most likely secondary to dehydration  -Patient was also found down, CKD elevated at 270  -Continue IV fluids -Hold nephrotoxic agents when possible -Continue to monitor  Anemia of chronic disease Hemoglobin 8.7>>>7.9 today Likely hemodilution, monitoring closely Check an iron level No signs of bleeding  Acute metabolic encephalopathy - Patient remained hypersomnolent, but now arousable, follows commands appropriate  -Does have a history of hypersomnolence disorder on her problem list  -  CT head shows no acute changes -No signs of infection -Electrolytes are within normal range -VBG shows PCO2 of 45, pH 7.32 -Polypharmacy??  Holding Lyrica, Risperdal, trazodone, Ativan -Patient is protecting airway, responds to light touch -Continue to monitor -We will continue to investigate underlying causes -PT OT evaluation -Neurochecks  Acquired hypothyroidism - Given generalized weakness  -  check TSH >>> normal at 0.49 -Continue Synthroid   Recurrent falls - See plan for generalized weakness  DMII (diabetes  mellitus, type 2) (HCC) - Continue to monitor CBGs -Sliding scale coverage  Essential hypertension - Continue metoprolol  Mixed hyperlipidemia - Continue Zetia            ----------------------------------------------------------------------------------------------------------------------------------------------- Nutritional status:  The patient's BMI is: Body mass index is 33.13 kg/m. I agree with the assessment and plan as outlined   -----------------------------------------------------------------------------------------------------------------------------------  DVT prophylaxis:  heparin injection 5,000 Units Start: 10/03/21 2200 SCDs Start: 10/03/21 1949   Code Status:   Code Status: Full Code  Family Communication: No family member present at bedside- attempt will be made to update daily The above findings and plan of care has been discussed with patient (and family)  in detail,  they expressed understanding and agreement of above. -Advance care planning has been discussed.   Admission status:   Status is: Inpatient Patient will remain as inpatient As patient still encephalopathic, somnolent, unable to ambulate..  Needing further work-up and observation.     Procedures:   No admission procedures for hospital encounter.   Antimicrobials:  Anti-infectives (From admission, onward)    None        Medication:   aspirin EC  81 mg Oral Q breakfast   buPROPion  150 mg Oral BH-q7a   ezetimibe  10 mg Oral Daily   heparin  5,000 Units Subcutaneous Q8H   insulin aspart  0-15 Units Subcutaneous TID WC   insulin aspart  0-5 Units Subcutaneous QHS   lamoTRIgine  100 mg Oral BID   levothyroxine  50 mcg Oral Q0600   metoprolol tartrate  50 mg Oral BID   mirabegron ER  25 mg Oral Daily   montelukast  10 mg Oral QHS    acetaminophen **OR** acetaminophen, morphine injection, ondansetron **OR** ondansetron (ZOFRAN) IV, oxyCODONE   Objective:   Vitals:    10/03/21 2216 10/04/21 0158 10/04/21 0517 10/04/21 0857  BP: (!) 165/60 (!) 132/55 (!) 107/53 (!) 109/57  Pulse: 71 62 62 62  Resp: '20 18 17   '$ Temp: 98.9 F (37.2 C) 97.8 F (36.6 C) 98.5 F (36.9 C)   TempSrc:  Oral    SpO2: 100% 100% 99% 97%  Weight: 74.4 kg     Height:        Intake/Output Summary (Last 24 hours) at 10/04/2021 1147 Last data filed at 10/04/2021 0800 Gross per 24 hour  Intake 990.43 ml  Output 120 ml  Net 870.43 ml   Filed Weights   10/03/21 1347 10/03/21 2216  Weight: 72.6 kg 74.4 kg     Examination:   Physical Exam  Constitution: Still somnolent but arousable cooperative, no distress,  Appears calm and comfortable  Psychiatric:   Normal and stable mood and affect, cognition intact,   HEENT:        Normocephalic, PERRL, otherwise with in Normal limits  Chest:         Chest symmetric Cardio vascular:  S1/S2, RRR, No murmure, No Rubs or Gallops  pulmonary: Clear to auscultation bilaterally, respirations unlabored, negative wheezes /  crackles Abdomen: Soft, non-tender, non-distended, bowel sounds,no masses, no organomegaly Muscular skeletal: Somnolent, generalized weaknesses,  Limited exam - in bed, able to move all 4 extremities,   Neuro: CNII-XII intact. , normal motor and sensation, reflexes intact  Extremities: No pitting edema lower extremities, +2 pulses  Skin: Dry, warm to touch, negative for any Rashes, No open wounds Wounds: per nursing documentation   ------------------------------------------------------------------------------------------------------------------------------------------    LABs:     Latest Ref Rng & Units 10/04/2021    4:49 AM 10/03/2021    1:54 PM 09/24/2021    6:34 AM  CBC  WBC 4.0 - 10.5 K/uL 5.7  9.0  5.4   Hemoglobin 12.0 - 15.0 g/dL 7.9  8.7  9.5   Hematocrit 36.0 - 46.0 % 27.8  30.9  33.0   Platelets 150 - 400 K/uL 227  265  230       Latest Ref Rng & Units 10/04/2021    4:49 AM 10/03/2021    1:54 PM  09/24/2021    6:34 AM  CMP  Glucose 70 - 99 mg/dL 117  102  103   BUN 8 - 23 mg/dL 39  37  28   Creatinine 0.44 - 1.00 mg/dL 1.79  1.74  1.20   Sodium 135 - 145 mmol/L 143  141  139   Potassium 3.5 - 5.1 mmol/L 3.7  3.6  4.7   Chloride 98 - 111 mmol/L 116  113  109   CO2 22 - 32 mmol/L '22  22  25   '$ Calcium 8.9 - 10.3 mg/dL 8.4  9.1  9.6   Total Protein 6.5 - 8.1 g/dL 5.7   7.3   Total Bilirubin 0.3 - 1.2 mg/dL 0.4   0.6   Alkaline Phos 38 - 126 U/L 62   56   AST 15 - 41 U/L 16   20   ALT 0 - 44 U/L 20   28        Micro Results No results found for this or any previous visit (from the past 240 hour(s)).  Radiology Reports CT Head Wo Contrast  Result Date: 10/03/2021 CLINICAL DATA:  Weakness.  Fall. EXAM: CT HEAD WITHOUT CONTRAST TECHNIQUE: Contiguous axial images were obtained from the base of the skull through the vertex without intravenous contrast. RADIATION DOSE REDUCTION: This exam was performed according to the departmental dose-optimization program which includes automated exposure control, adjustment of the mA and/or kV according to patient size and/or use of iterative reconstruction technique. COMPARISON:  09/15/2021 FINDINGS: Brain: Stable chronic bifrontal encephalomalacia underlying site of prior bifrontal craniotomy. Periventricular white matter and corona radiata hypodensities favor chronic ischemic microvascular white matter disease. Otherwise, the brainstem, cerebellum, cerebral peduncles, thalamus, basal ganglia, basilar cisterns, and ventricular system appear within normal limits. No intracranial hemorrhage, mass lesion, or acute CVA. Vascular: There is atherosclerotic calcification of the cavernous carotid arteries bilaterally. Skull: Prior bifrontal craniotomy. Arthropathy of the temporomandibular joints with subcortical cyst formation of the right mandibular condyle and some spurring of both mandibular condyles. Sinuses/Orbits: Unremarkable Other: No supplemental  non-categorized findings. IMPRESSION: 1. No acute intracranial findings. 2. Chronic bifrontal encephalomalacia underlying a region of prior craniotomy. 3. Periventricular white matter and corona radiata hypodensities favor chronic ischemic microvascular white matter disease. Electronically Signed   By: Van Clines M.D.   On: 10/03/2021 16:41   DG Chest Port 1 View  Result Date: 10/03/2021 CLINICAL DATA:  Weakness EXAM: PORTABLE CHEST 1 VIEW COMPARISON:  05/17/2021 FINDINGS: Low lung  volumes. No acute airspace disease or effusion. Mild cardiomegaly. No pneumothorax. IMPRESSION: No active disease.  Low lung volumes. Electronically Signed   By: Donavan Foil M.D.   On: 10/03/2021 15:45    SIGNED: Deatra James, MD, FHM. [facility],  Pager (please use [email or web address] to [place]/text) Please use Epic Secure Chat for non-urgent communication (7AM-7PM)  If 7PM-7AM, please contact night-coverage [email or web address], 10/04/2021, 11:47 AM

## 2021-10-04 NOTE — Assessment & Plan Note (Signed)
Hemoglobin 8.7>>>7.9 today Likely hemodilution, monitoring closely Check an iron level No signs of bleeding

## 2021-10-04 NOTE — Assessment & Plan Note (Signed)
Continue Zetia. °

## 2021-10-04 NOTE — Assessment & Plan Note (Addendum)
-   Generalized weakness with recurrent falls - TSH, B12, vitamin D >>> all within normal limits -UA--no pyuria -PT eval and treat>>HHPT, pt refuses SNF

## 2021-10-04 NOTE — Plan of Care (Signed)
  Problem: Acute Rehab PT Goals(only PT should resolve) Goal: Pt Will Go Supine/Side To Sit Outcome: Progressing Flowsheets (Taken 10/04/2021 1712) Pt will go Supine/Side to Sit: with min guard assist Goal: Patient Will Transfer Sit To/From Stand Outcome: Progressing Flowsheets (Taken 10/04/2021 1712) Patient will transfer sit to/from stand: with min guard assist Goal: Pt Will Transfer Bed To Chair/Chair To Bed Outcome: Progressing Flowsheets (Taken 10/04/2021 1712) Pt will Transfer Bed to Chair/Chair to Bed: min guard assist Goal: Pt Will Ambulate Outcome: Progressing Flowsheets (Taken 10/04/2021 1712) Pt will Ambulate:  with rolling walker  50 feet  with min guard assist

## 2021-10-04 NOTE — Evaluation (Signed)
Physical Therapy Evaluation Patient Details Name: [name] MRN: [id number] DOB: 08/25/1950 Today's Date: 10/04/2021  History of Present Illness  [name] is a 71 y.o. female with medical history significant of anemia, anxiety, bipolar disorder, cancer, COPD, GERD, hyperlipidemia, hypertension, hypothyroidism, seizures, paralysis, stroke, and more presents ED with a chief complaint of generalized weakness.  Patient has home health, who found her in the bathtub.  Apparently she was trying to something in the bathroom when she fell backwards into the bathtub and was too weak to get up.  Patient's home health seems to think that patient has been declining since her last admission.  Patient herself, is not able to give any history.  She is hypersomnolent.  She opens her eyes to light touch, but will not speak and goes back to sleep.  She is protecting her airway.  No further history could be obtained    Clinical Impression  Patient lying in bed on therapist arrival.  She is alert and agreeable to physical therapy.  Therapist assists patient with supine to sit with HOB elevated; she needs significant use of upper extremities to pull up her trunk and therapist min A for trunk and legs to come fully to edge of bed.  Once sitting on the edge of the bed she initially has trouble maintaining her balance; wanting to fall back but after sitting up for 30 sec or so is able to do so with therapist CGA.  Patient performs sit to stand to RW with min A; she demonstrates initial unsteady standing balance but improves after a few seconds of standing.  Patient ambulates in the room with RW and min A ; she needs A to navigate the walker in the room.  Patient returns to bed with minimal assistance from therapist for her legs.  Patient will benefit from continued skilled therapy services to address deficits and promote return to optimal functional during the duration of her hospital stay and at the next recommended venue  of care.      Recommendations for follow up therapy are one component of a multi-disciplinary discharge planning process, led by the attending physician.  Recommendations may be updated based on patient status, additional functional criteria and insurance authorization.  Follow Up Recommendations Home health PT Can patient physically be transported by private vehicle: Yes    Assistance Recommended at Discharge Intermittent Supervision/Assistance  Patient can return home with the following  A little help with bathing/dressing/bathroom;Assistance with cooking/housework;Help with stairs or ramp for entrance;A little help with walking and/or transfers    Equipment Recommendations None recommended by PT  Recommendations for Other Services       Functional Status Assessment Patient has had a recent decline in their functional status and demonstrates the ability to make significant improvements in function in a reasonable and predictable amount of time.     Precautions / Restrictions Precautions Precautions: Fall Restrictions Weight Bearing Restrictions: No      Mobility  Bed Mobility Overal bed mobility: Needs Assistance Bed Mobility: Supine to Sit, Sit to Supine     Supine to sit: HOB elevated, Min assist Sit to supine: Min assist   General bed mobility comments: increased time, labored movement, required HOB raised; needs min A for legs Patient Response: Cooperative  Transfers Overall transfer level: Needs assistance Equipment used: Rolling walker (2 wheels) Transfers: Sit to/from Stand Sit to Stand: Min assist           General transfer comment: slow labored unsteady  movement    Ambulation/Gait Ambulation/Gait assistance: Min assist Gait Distance (Feet): 5 Feet Assistive device: Rolling walker (2 wheels) Gait Pattern/deviations: Decreased step length - right, Decreased step length - left, Decreased stride length, Trunk flexed, Shuffle Gait velocity: decreased      General Gait Details: unsteady; needs A to navigate the [employer] Mobility    Modified Rankin (Stroke Patients Only)       Balance Overall balance assessment: Needs assistance Sitting-balance support: Feet supported, No upper extremity supported Sitting balance-Leahy Scale: Fair Sitting balance - Comments: fair seated at EOB Postural control: Posterior lean Standing balance support: During functional activity, Bilateral upper extremity supported Standing balance-Leahy Scale: Poor Standing balance comment: fair/poor using RW                             Pertinent Vitals/Pain Pain Assessment Pain Assessment: 0-10 Pain Score: 6  Pain Location: low back, neck and left shoulder Pain Intervention(s): Limited activity within patient's tolerance, Monitored during session    Home Living Family/patient expects to be discharged to:: Private residence Living Arrangements: Alone Available Help at Discharge: Personal care attendant;Available PRN/intermittently;Family;Friend(s) Type of Home: Apartment Home Access: Level entry       Home Layout: One level Home Equipment: Conservation officer, nature (2 wheels);Cane - single point;BSC/3in1;Hand held shower head;Grab bars - toilet;Grab bars - tub/shower;Cane - quad;Rollator (4 wheels);Shower seat - built in;Wheelchair - manual;Hospital bed      Prior Function Prior Level of Function : Needs assist       Physical Assist : Mobility (physical);ADLs (physical) Mobility (physical): Bed mobility;Transfers;Gait;Stairs ADLs (physical): IADLs Mobility Comments: household ambulator using Rollator most of time, sometimes single point cane, uses electric carts in grocery stores ADLs Comments: home aides 8 hours/day x 5 days/week; looking to add time to weekend per patient     Hand Dominance   Dominant Hand: Right    Extremity/Trunk Assessment   Upper Extremity Assessment Upper Extremity Assessment:  Generalized weakness    Lower Extremity Assessment Lower Extremity Assessment: Generalized weakness    Cervical / Trunk Assessment Cervical / Trunk Assessment: Kyphotic  Communication   Communication: No difficulties  Cognition Arousal/Alertness: Awake/alert Behavior During Therapy: WFL for tasks assessed/performed Overall Cognitive Status: Within Functional Limits for tasks assessed                                          General Comments      Exercises     Assessment/Plan    PT Assessment Patient needs continued PT services  PT Problem List Decreased strength;Decreased activity tolerance;Decreased balance;Decreased mobility       PT Treatment Interventions DME instruction;Gait training;Stair training;Functional mobility training;Therapeutic activities;Therapeutic exercise;Balance training;Patient/family education    PT Goals (Current goals can be found in the Care Plan section)  Acute Rehab PT Goals Patient Stated Goal: return home with home aides to assist PT Goal Formulation: With patient Time For Goal Achievement: 10/18/21 Potential to Achieve Goals: Good    Frequency Min 2X/week     Co-evaluation               AM-PAC PT "6 Clicks" Mobility  Outcome Measure Help needed turning from your back to your side while in a flat bed without using bedrails?: None Help needed  moving from lying on your back to sitting on the side of a flat bed without using bedrails?: A Little Help needed moving to and from a bed to a chair (including a wheelchair)?: A Little Help needed standing up from a chair using your arms (e.g., wheelchair or bedside chair)?: A Little Help needed to walk in hospital room?: A Lot Help needed climbing 3-5 steps with a railing? : A Lot 6 Click Score: 17    End of Session   Activity Tolerance: Patient tolerated treatment well;Patient limited by fatigue Patient left: with call bell/phone within reach;with family/visitor  present;in bed;with nursing/sitter in room Nurse Communication: Mobility status PT Visit Diagnosis: Unsteadiness on feet (R26.81);Other abnormalities of gait and mobility (R26.89);Muscle weakness (generalized) (M62.81)    Time: [id number] PT Time Calculation (min) (ACUTE ONLY): 30 min   Charges:   PT Evaluation $PT Eval Low Complexity: 1 Low PT Treatments $Therapeutic Activity: 8-22 mins        5:11 PM, 10/04/21 Canda Podgorski Small Sundai Probert MPT [facility] [place] [facility] [place] [phone] [phone]

## 2021-10-04 NOTE — Assessment & Plan Note (Signed)
- 

## 2021-10-04 NOTE — Hospital Course (Addendum)
[name] is a 71 y.o. female with medical history significant of anemia, anxiety, bipolar disorder, cancer, COPD, GERD, hyperlipidemia, hypertension, hypothyroidism, seizures, paralysis, stroke, and more presents ED with a chief complaint of generalized weakness.  Patient has home health, who found her in the bathtub.  Apparently she was trying to something in the bathroom when she fell backwards into the bathtub and was too weak to get up.  Patient's home health seems to think that patient has been declining since her last admission.  Patient herself, is not able to give any history.  She is hypersomnolent.  She opens her eyes to light touch, but will not speak and goes back to sleep.  She is protecting her airway.  No further history could be obtained.    ED Temp 98.3, heart rate 57-62, respiratory rate 17-21, blood pressure 101/47-125/55, satting at 97% pH 7.3, PO2 44 No leukocytosis Chemistry shows a BUN of 37 and a creatinine 1.74 CPK 270 Trope 5 UA is not indicative of UTI CT head has chronic changes but no acute intracranial abnormality Chest x-ray shows no active disease EKG shows a heart rate of 60, sinus rhythm, QTc 386 Patient was given 500 normal bolus of normal saline Admission requested for acute metabolic encephalopathy and generalized weakness

## 2021-10-04 NOTE — TOC Initial Note (Signed)
Transition of Care [facility]) - Initial/Assessment Note    Patient Details  Name: [name] MRN: [id number] Date of Birth: Nov 24, 1950  Transition of Care [facility]) CM/SW Contact:    Ihor Gully, LCSW Phone Number: 10/04/2021, 1:21 PM  Clinical Narrative:                 Patient from home alone. She has an aide M-F 8 hours a day. Typically uses [facility] for [facility] when needed. Uses a walker. Has a cane, bathroom is handicap accessible. Maintains medical appointments. Assessed due to high readmission risk.   Expected Discharge Plan: [place] Barriers to Discharge: Continued Medical Work up   Patient Goals and CMS Choice Patient states their goals for this hospitalization and ongoing recovery are:: return home      Expected Discharge Plan and Services Expected Discharge Plan: [place] Acute Care Choice: [place] arrangements for the past 2 months: Apartment                                      Prior Living Arrangements/Services Living arrangements for the past 2 months: Apartment Lives with:: Self Patient language and need for interpreter reviewed:: Yes Do you feel safe going back to the place where you live?: Yes      Need for Family Participation in Patient Care: Yes (Comment) Care giver support system in place?: Yes (comment) Current home services: DME, Other (comment) (home health aide) Criminal Activity/Legal Involvement Pertinent to Current Situation/Hospitalization: No - Comment as needed  Activities of Daily Living Home Assistive Devices/Equipment: Environmental consultant (specify type), Cane (specify quad or straight) ADL Screening (condition at time of admission) Patient's cognitive ability adequate to safely complete daily activities?: No Is the patient deaf or have difficulty hearing?: No Does the patient have difficulty seeing, even when wearing glasses/contacts?: No Does the patient have difficulty concentrating,  remembering, or making decisions?: No Patient able to express need for assistance with ADLs?: Yes Does the patient have difficulty dressing or bathing?: Yes Independently performs ADLs?: No Communication: Needs assistance Is this a change from baseline?: Pre-admission baseline Dressing (OT): Needs assistance Is this a change from baseline?: Pre-admission baseline Grooming: Needs assistance Is this a change from baseline?: Pre-admission baseline Feeding: Independent Is this a change from baseline?: Pre-admission baseline Bathing: Needs assistance Is this a change from baseline?: Pre-admission baseline Toileting: Needs assistance Is this a change from baseline?: Pre-admission baseline In/Out Bed: Needs assistance Is this a change from baseline?: Pre-admission baseline Walks in Home: Needs assistance Is this a change from baseline?: Pre-admission baseline Does the patient have difficulty walking or climbing stairs?: Yes Weakness of Legs: Both Weakness of Arms/Hands: None  Permission Sought/Granted                  Emotional Assessment Appearance:: Appears stated age   Affect (typically observed): Appropriate Orientation: : Oriented to Self, Oriented to Place, Oriented to  Time, Oriented to Situation Alcohol / Substance Use: Not Applicable Psych Involvement: No (comment)  Admission diagnosis:  Generalized weakness [R53.1] Patient Active Problem List   Diagnosis Date Noted   Subconjunctival hemorrhage of left eye 10/02/2021   Dehydration 09/25/2021   Encounter for support and coordination of transition of care 09/25/2021   Acute renal failure superimposed on stage 3a chronic kidney disease ([place]) 09/23/2021   AKI (acute  kidney injury) ([place]) 09/16/2021   CHF (congestive heart failure) ([place]) 09/16/2021   Abnormal EKG 09/16/2021   Urinary frequency 09/01/2021   Anemia of chronic disease 08/10/2021   Generalized pain 08/04/2021   Sciatica, right side 07/19/2021   Breast  pain, left 07/10/2021   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/10/2021   Atherosclerosis of aorta (HCC) 07/10/2021   Generalized weakness 05/12/2021   Advanced care planning/counseling discussion 12/23/2020   Head trauma, initial encounter 12/20/2020   Trigger finger, right 12/12/2020   Confusion 11/24/2020   Blurry vision 11/18/2020   Cervical radiculopathy 07/09/2020   Closed fracture of lateral malleolus of right fibula 02/27/2020   Headache 02/19/2020   Tubular adenoma of colon 02/09/2019   Benign neoplasm of cerebral meninges (HCC) 11/12/2018   Benign meningioma of brain ([place]) 10/31/2018   Osteoarthritis of both hips 07/06/2018   Lipoma of extremity 12/31/2017   Impingement syndrome of left shoulder region 12/27/2017   Weakness generalized    Leg weakness, bilateral 10/28/2017   Lumbar spondylosis with myelopathy 10/12/2017   Numbness of hand 10/10/2017   Chronic neck pain 07/25/2017   Chronic pain syndrome 07/25/2017   At risk for cardiovascular event 66/29/4765   Acute metabolic encephalopathy 46/50/3546   Diabetic polyneuropathy associated with type 2 diabetes mellitus ([place]) 05/19/2016   Tobacco user 04/24/2016   Obesity (BMI 30.0-34.9) 04/24/2016   History of palpitations 08/09/2015   Labile hypertension 08/03/2015   Normal coronary arteries 08/03/2015   Dizziness 07/15/2015   Left-sided low back pain with left-sided sciatica 06/27/2015   Multinodular goiter 05/06/2015   Rectocele, female 04/27/2015   Anal sphincter incontinence 04/27/2015   Pelvic relaxation due to rectocele 03/30/2015   Hyperkalemia 02/22/2015   Pulmonary hypertension ([place]) 02/22/2015   Posterior chest pain 02/21/2015   Episodic cigarette smoking dependence 01/11/2015   Migraine without aura and without status migrainosus, not intractable 07/02/2014   Flatulence 02/18/2014   Microcytic anemia 02/18/2014   Acquired hypothyroidism  08/16/2013   Gastroparesis 04/28/2013   Nicotine dependence 03/09/2013   Seizure disorder ([place]) 01/19/2013   Displacement of cervical intervertebral disc without myelopathy 12/13/2012   Bursitis of shoulder 12/13/2012   UTI (urinary tract infection) 12/01/2012   Cervical disc disorder with radiculopathy of cervical region 10/31/2012   Solitary pulmonary nodule 08/19/2012   Anemia 07/05/2012   Hypersomnia disorder related to a known organic factor 06/11/2012   Pruritus 04/18/2012   Meningioma ([place]) 11/19/2011   Mononeuritis leg 10/25/2011   Carpal tunnel syndrome of right wrist 05/23/2011   Chronic pain of right hand 05/04/2011   Polypharmacy 04/28/2011   Mood disorder ([place]) 04/28/2011   Constipation 04/13/2011   Recurrent falls 12/12/2010   Oropharyngeal dysphagia 07/12/2010   Urinary incontinence 12/16/2009   HEARING LOSS 10/26/2009   DMII (diabetes mellitus, type 2) ([place]) 07/07/2009   Mixed hyperlipidemia 12/11/2008   IBS 12/11/2008   GERD 07/29/2008   MILK PRODUCTS ALLERGY 07/29/2008   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/03/2007   Essential hypertension 06/27/2007   Backache 06/19/2007   Osteoporosis 06/19/2007   Obstructive sleep apnea 06/19/2007   TRIGGER FINGER 04/18/2007   DIVERTICULOSIS, COLON 11/13/2006   PCP:  [name], MD Pharmacy:   [place], [place] [address] [place] [address] Phone: [phone] Fax: [place], [address] [place]  [place] [address] Phone: [phone] Fax: [phone]     Social Determinants of Health (SDOH) Interventions    Readmission Risk Interventions    09/16/2021   10:07 AM  Readmission Risk Prevention Plan  Transportation Screening Complete  Medication Review Press photographer) Complete  HRI or [place] Complete  SW Recovery Care/Counseling Consult  Complete  Palliative Care Screening Not [place] Patient Refused

## 2021-10-04 NOTE — Assessment & Plan Note (Signed)
-   Continue to monitor CBGs 09/19/2021 hemoglobin A1c 5.5 At baseline, patient takes Toujeo 15 units at bedtime and NovoLog 10 units with small meals, 15 units with large meals Continue reduced dose Semglee

## 2021-10-04 NOTE — Assessment & Plan Note (Addendum)
-   Patient remained hypersomnolent, but now arousable, follows commands appropriate  -Does have a history of hypersomnolence disorder on her problem list  -CT head shows no acute changes -No signs of infection -Electrolytes are within normal range -VBG shows PCO2 of 45, pH 7.32 -Polypharmacy??  Holding Lyrica, Risperdal, trazodone, Ativan -Patient is protecting airway, responds to light touch -Continue to monitor -We will continue to investigate underlying causes -PT OT evaluation -Neurochecks

## 2021-10-04 NOTE — Assessment & Plan Note (Addendum)
-  Continue Wellbutrin, - currently holding sedating medications given patient's altered mental status including Ativan, Risperdal, trazodone -Continue Lamictal for mood stabilization

## 2021-10-04 NOTE — Assessment & Plan Note (Signed)
-   See plan for generalized weakness

## 2021-10-04 NOTE — Assessment & Plan Note (Addendum)
Baseline creatinine 1.1-1.2 Secondary to volume depletion Patient presented with serum creatinine 1.79 Continue IV fluids 05/06/2021 renal ultrasound--diffuse renal cortical thinning and increased echogenicity bilateral. Serum creatinine 1.01 on day of d/c

## 2021-10-04 NOTE — H&P (Signed)
History and Physical    Patient: [name] TKW:[id number] DOB: Oct 01, 1950 DOA: 10/03/2021 DOS: the patient was seen and examined on 10/04/2021 PCP: [name], MD  Patient coming from: Home  Chief Complaint:  Chief Complaint  Patient presents with   Weakness   HPI: [name] is a 71 y.o. female with medical history significant of anemia, anxiety, bipolar disorder, cancer, COPD, GERD, hyperlipidemia, hypertension, hypothyroidism, seizures, paralysis, stroke, and more presents ED with a chief complaint of generalized weakness.  Patient has home health, who found her in the bathtub.  Apparently she was trying to something in the bathroom when she fell backwards into the bathtub and was too weak to get up.  Patient's home health seems to think that patient has been declining since her last admission.  Patient herself, is not able to give any history.  She is hypersomnolent.  She opens her eyes to light touch, but will not speak and goes back to sleep.  She is protecting her airway.  No further history could be obtained.   Review of Systems: unable to review all systems due to the inability of the patient to answer questions. Past Medical History:  Diagnosis Date   Allergy    Anemia    Anemia in chronic kidney disease (CKD) 08/10/2021   Anxiety    takes Ativan daily   Arthritis    Assistance needed for mobility    Bipolar disorder ([place])    takes Risperdal nightly   Blood transfusion    Brain tumor ([place])    Cancer ([place])    In her gum   Carpal tunnel syndrome of right wrist 05/23/2011   Cervical disc disorder with radiculopathy of cervical region 10/31/2012   Chronic back pain    Chronic idiopathic constipation    Chronic neck and back pain    Colon polyps    COPD (chronic obstructive pulmonary disease) with chronic bronchitis ([place]) 09/16/2013   Office Spirometry 10/30/2013-submaximal effort based on appearance of loop and curve. Numbers would fit with severe restriction  but her physiologic capability may be better than this. FVC 0.91/44%, and 10.74/45%, FEV1/FVC 0.81, FEF 25-75% 1.43/69%     Diabetes mellitus    Type II   Diverticulosis    TCS 9/08 by Dr. Delfin Edis for diarrhea . Bx for micro scopic colitis negative.    Fibromyalgia    Frequent falls    GERD (gastroesophageal reflux disease)    takes Aciphex daily   Glaucoma    eye drops daily   Gum symptoms    infection on antibiotic   Hiatal hernia    Hyperlipidemia    takes Crestor daily   Hypertension    takes Amlodipine,Metoprolol,and Clonidine daily   Hypothyroidism    takes Synthroid daily   IBS (irritable bowel syndrome)    Insomnia    takes Trazodone nightly   Major depression, recurrent ([place])    takes Zoloft daily   Malignant hyperpyrexia 92/42/6834   Metabolic encephalopathy 19/62/2297   Migraines    chronic headaches   Mononeuritis lower limb    Narcolepsy    Osteoporosis    Pancreatitis 2006   due to Depakote with normal EUS    Paralysis (HCC)    Schatzki's ring    non critical / EGD with ED 8/2011with RMR   Seizures ([place])    takes Lamictal daily.Last seizure 3 yrs ago   Sleep apnea    on CPAP   Small bowel obstruction ([place])  Stroke [facility])    left sided weakness, speech changes   Tubular adenoma of colon    Past Surgical History:  Procedure Laterality Date   ABDOMINAL HYSTERECTOMY  1978   BACK SURGERY  July 2012   BACTERIAL OVERGROWTH TEST N/A 05/05/2013   Procedure: BACTERIAL OVERGROWTH TEST;  Surgeon: Daneil Dolin, MD;  [facility];  Service: Endoscopy;  Laterality: N/A;  7:30   BIOPSY THYROID  2009   BRAIN SURGERY  11/2011   resection of meningioma   BREAST REDUCTION SURGERY  1994   CARDIAC CATHETERIZATION  05/10/2005   normal coronaries, normal LV systolic function and EF (Dr. Jackie Plum)   [place] Left 07/22/04   Dr. Aline Brochure   CATARACT EXTRACTION Bilateral    CHOLECYSTECTOMY  1984   COLONOSCOPY N/A 09/25/2012   HQP:[id number]  diverticulosis.  colonic polyp-removed : tubular adenoma   CRANIOTOMY  11/23/2011   Procedure: CRANIOTOMY TUMOR EXCISION;  Surgeon: Hosie Spangle, MD;  Location: [place] NEURO ORS;  Service: Neurosurgery;  Laterality: N/A;  Craniotomy for tumor resection   ESOPHAGOGASTRODUODENOSCOPY  12/29/2010   [place]-Retained food in the esophagus and stomach, small hiatal hernia, status post Maloney dilation of the esophagus   ESOPHAGOGASTRODUODENOSCOPY N/A 09/25/2012   [id number] atonic baggy esophagus status post Maloney dilation 41 F. Hiatal hernia   GIVENS CAPSULE STUDY N/A 01/15/2013   NORMAL.    IR GENERIC HISTORICAL  03/17/2016   IR RADIOLOGIST EVAL & MGMT 03/17/2016 [facility]-INTERV RAD   LESION REMOVAL N/A 05/31/2015   Procedure: REMOVAL RIGHT AND LEFT LESIONS OF MANDIBLE;  Surgeon: Diona Browner, DDS;  Location: [place];  Service: Oral Surgery;  Laterality: N/A;   MALONEY DILATION  12/29/2010   RMR;   NM MYOCAR PERF WALL MOTION  2006   "relavtiely normal" persantine, mild anterior thinning (breast attenuation artifact), no region of scar/ischemia   OVARIAN CYST REMOVAL     RECTOCELE REPAIR N/A 06/29/2015   Procedure: POSTERIOR REPAIR (RECTOCELE);  Surgeon: Jonnie Kind, MD;  Location: [facility];  Service: Gynecology;  Laterality: N/A;   REDUCTION MAMMAPLASTY Bilateral    SPINE SURGERY  09/29/2010   Dr. Rolena Infante   surgical excision of 3 tumors from right thigh and right buttock  and left upper thigh  2010   TOOTH EXTRACTION Bilateral 12/14/2014   Procedure: REMOVAL OF BILATERAL MANDIBULAR EXOSTOSES;  Surgeon: Diona Browner, DDS;  Location: [place];  Service: Oral Surgery;  Laterality: Bilateral;   TRANSTHORACIC ECHOCARDIOGRAM  2010   EF 60-65%, mild conc LVH, grade 1 diastolic dysfunction; mildly calcified MV annulus with mildly thickened leaflets, mildly calcified MR annulus   Social History:  reports that she quit smoking about 2 months ago. Her smoking use included cigarettes. She has a 7.50 pack-year smoking  history. She has been exposed to tobacco smoke. She has never used smokeless tobacco. She reports that she does not drink alcohol and does not use drugs.  Allergies  Allergen Reactions   Iron Nausea And Vomiting    And itching And itching   Milk ([place]) Rash    Doesn't agree with stomach.  Doesn't agree with stomach.  Doesn't agree with stomach.    Penicillins Hives    Has patient had a PCN reaction causing immediate rash, facial/tongue/throat swelling, SOB or lightheadedness with hypotension: Yes Has patient had a PCN reaction causing severe rash involving mucus membranes or skin necrosis: No Has patient had a PCN reaction that required hospitalization No Has patient had a PCN  reaction occurring within the last 10 years: No If all of the above answers are "NO", then may proceed with Cephalosporin use.  Other reaction(s): Other (see comments) Has patient had a PCN reaction causing immediate rash, facial/tongue/throat swelling, SOB or lightheadedness with hypotension: Yes Has patient had a PCN reaction causing severe rash involving mucus membranes or skin necrosis: No Has patient had a PCN reaction that required hospitalization No Has patient had a PCN reaction occurring within the last 10 years: No If all of the above answers are "NO", then may proceed with Cephalosporin use. Has patient had a PCN reaction causing immediate rash, facial/tongue/throat swelling, SOB or lightheadedness with hypotension: Yes Has patient had a PCN reaction causing severe rash involving mucus membranes or skin necrosis: No Has patient had a PCN reaction that required hospitalization No Has patient had a PCN reaction occurring within the last 10 years: No If all of the above answers are "NO", then may proceed with Cephalosporin use. Has patient had a PCN reaction causing immediate rash, facial/tongue/throat swelling, SOB or lightheadedness with hypotension: Yes Has patient had a PCN reaction causing severe rash  involving mucus membranes or skin necrosis: No Has patient had a PCN reaction that required hospitalization No Has patient had a PCN reaction occurring within the last 10 years: No If all of the above answers are "NO", then may proceed with Cephalosporin use. Has patient had a PCN reaction causing immediate rash, facial/tongue/throat swelling, SOB or lightheadedness with hypotension: Yes Has patient had a PCN reaction causing sever... (TRUNCATED)   Phenazopyridine Hives   Phenazopyridine Hcl Hives   Cephalexin Hives   Flonase [Fluticasone]     "It gave me ulcers in my nose"   Milk-Related Compounds Other (See Comments)    Doesn't agree with stomach.    Phenazopyridine Hcl Hives          Family History  Problem Relation Age of Onset   Heart attack Mother        HTN   Pneumonia Father    Kidney failure Father    Diabetes Father    Pancreatic cancer Sister    Cancer Sister        breast    Cancer Sister        pancreatic   Diabetes Brother    Hypertension Brother    Diabetes Brother    Alcohol abuse Maternal Uncle    Stroke Maternal Grandmother    Heart attack Maternal Grandfather    Hypertension Son    Sleep apnea Son    Colon cancer Neg Hx    Anesthesia problems Neg Hx    Hypotension Neg Hx    Malignant hyperthermia Neg Hx    Pseudochol deficiency Neg Hx    Breast cancer Neg Hx    Stomach cancer Neg Hx     Prior to Admission medications   Medication Sig Start Date End Date Taking? Authorizing Provider  albuterol (VENTOLIN HFA) 108 (90 Base) MCG/ACT inhaler INHALE 1 TO 2 PUFFS EVERY 6 HOURS AS NEEDED FOR WHEEZING, SHORTNESS OF BREATH Patient taking differently: Inhale 1-2 puffs into the lungs every 6 (six) hours as needed for wheezing or shortness of breath. 06/24/21  Yes Young, Tarri Fuller D, MD  alendronate (FOSAMAX) 70 MG tablet TAKE 1 TABLET (70 MG TOTAL) BY MOUTH EVERY 7 (SEVEN) DAYS. TAKE WITH A FULL GLASS OF WATER ON AN EMPTY STOMACH. 02/04/21  Yes [name], MD  amLODipine (NORVASC) 10 MG tablet TAKE 1 TABLET  EVERY DAY Patient taking differently: Take 10 mg by mouth daily. 06/06/21  Yes Imogene Burn, PA-C  ascorbic acid (VITAMIN C) 500 MG tablet Take 500 mg by mouth daily.   Yes [provider]  aspirin EC 81 MG tablet Take 1 tablet (81 mg total) by mouth daily with breakfast. 12/08/18  Yes Emokpae, Courage, MD  buPROPion (WELLBUTRIN XL) 150 MG 24 hr tablet Take 1 tablet (150 mg total) by mouth every morning. 07/12/21 07/12/22 Yes Cloria Spring, MD  Calcium Carb-Cholecalciferol (CALTRATE 600+D3 SOFT) 600-20 MG-MCG CHEW Chew 1 tablet by mouth daily at 12 noon.   Yes [provider]  cetirizine (ZYRTEC) 10 MG tablet Take 1 tablet (10 mg total) by mouth daily. 04/25/21  Yes Paseda, Dewaine Conger, FNP  Cholecalciferol (D3 PO) Take 1 tablet by mouth daily.   Yes [provider]  Clobetasol Prop Emollient Base (CLOBETASOL PROPIONATE E) 0.05 % emollient cream Apply to affected area qd Patient taking differently: Apply 1 Application topically daily. 01/24/21  Yes Lavonna Monarch, MD  diclofenac Sodium (VOLTAREN) 1 % GEL APPLY 2 GRAMS TOPICALLY FOUR TIMES DAILY. Patient taking differently: Apply 2 g topically 4 (four) times daily. 06/20/21  Yes Esterwood, Amy S, PA-C  Docusate Sodium (DSS) 100 MG CAPS Take 1 tablet by mouth in the morning and at bedtime.   Yes [provider]  ezetimibe (ZETIA) 10 MG tablet TAKE 1 TABLET EVERY DAY Patient taking differently: Take 10 mg by mouth daily. 06/22/21  Yes [name], MD  insulin aspart (NOVOLOG FLEXPEN) 100 UNIT/ML FlexPen Inject 5 Units into the skin 3 (three) times daily with meals. Give if eats 50% or more of meal. Patient taking differently: Inject 14 Units into the skin 3 (three) times daily with meals. Give if eats 50% or more of meal. 09/17/21  Yes Johnson, Clanford L, MD  insulin glargine, 2 Unit Dial, (TOUJEO MAX SOLOSTAR) 300 UNIT/ML Solostar Pen Inject 20-25 Units  into the skin daily. 09/29/21  Yes Philemon Kingdom, MD  lamoTRIgine (LAMICTAL) 100 MG tablet Take 1 tablet (100 mg total) by mouth 2 (two) times daily. 07/12/21  Yes Cloria Spring, MD  levothyroxine (SYNTHROID) 50 MCG tablet TAKE 1 TABLET EVERY DAY FOR 6 DAYS PER WEEK, 1/2 TABLET 1 DAY PER WEEK 07/07/21  Yes Philemon Kingdom, MD  LORazepam (ATIVAN) 0.5 MG tablet Take 1 tablet (0.5 mg total) by mouth 2 (two) times daily. 07/12/21  Yes Cloria Spring, MD  meclizine (ANTIVERT) 25 MG tablet Take 1 tablet (25 mg total) by mouth 3 (three) times daily as needed for dizziness. 04/28/20  Yes Noreene Larsson, NP  metoprolol tartrate (LOPRESSOR) 50 MG tablet TAKE 1 TABLET TWICE DAILY (NEED MD APPOINTMENT) Patient taking differently: Take 50 mg by mouth 2 (two) times daily. 08/29/21  Yes Strader, [place] M, PA-C  mirabegron ER (MYRBETRIQ) 25 MG TB24 tablet Take 1 tablet (25 mg total) by mouth daily. 03/21/21  Yes [name], MD  montelukast (SINGULAIR) 10 MG tablet TAKE 1 TABLET EVERY DAY Patient taking differently: Take 10 mg by mouth at bedtime. 05/20/21  Yes [name], MD  Multiple Vitamins-Minerals (MULTIVITAMIN GUMMIES ADULT PO) Take 1 tablet by mouth daily.   Yes [provider]  nystatin (MYCOSTATIN/NYSTOP) powder APPLY TO AFFECTED AREA 4 TIMES DAILY. Patient taking differently: Apply 1 Application topically in the morning, at noon, in the evening, and at bedtime. 09/12/21  Yes [name], MD  Omega-3 Fatty Acids (  FISH OIL PO) Take 1 capsule by mouth daily.   Yes [provider]  ondansetron (ZOFRAN) 4 MG tablet Take 1 tablet (4 mg total) by mouth every 8 (eight) hours as needed for nausea or vomiting. 07/19/21  Yes Alvira Monday, FNP  oxyCODONE-acetaminophen (PERCOCET) 10-325 MG tablet Take 1 tablet by mouth every 8 (eight) hours as needed for pain. 11/01/20  Yes [provider]  pregabalin (LYRICA) 75 MG capsule Take 1 capsule (75 mg total) by mouth daily.  10/20/16  Yes Rexene Alberts, MD  RABEprazole (ACIPHEX) 20 MG tablet Take 1 tablet (20 mg total) by mouth 2 (two) times daily. 06/29/21  Yes Pyrtle, Lajuan Lines, MD  RESTASIS 0.05 % ophthalmic emulsion Place 2 drops into both eyes daily. 07/29/20  Yes [provider]  risperiDONE (RISPERDAL) 0.5 MG tablet Take 1 tablet (0.5 mg total) by mouth at bedtime. 07/12/21  Yes Cloria Spring, MD  sertraline (ZOLOFT) 100 MG tablet Take 1 tablet (100 mg total) by mouth every morning. 07/12/21  Yes Cloria Spring, MD  STIOLTO RESPIMAT 2.5-2.5 MCG/ACT AERS INHALE 2 PUFFS INTO THE LUNGS DAILY. Patient taking differently: Inhale 2 puffs into the lungs daily. 05/31/20  Yes Parrett, Tammy S, NP  traZODone (DESYREL) 150 MG tablet Take 1 tablet (150 mg total) by mouth at bedtime. 07/12/21  Yes Cloria Spring, MD  vitamin E 180 MG (400 UNITS) capsule Take 400 Units by mouth daily.   Yes [provider]  ACCU-CHEK GUIDE test strip TEST BLOOD SUGAR FOUR TIMES DAILY  AND AS NEEDED 05/30/21   Philemon Kingdom, MD  Accu-Chek Softclix Lancets lancets TEST BLOOD SUGAR THREE TIMES DAILY AS DIRECTED 03/28/21   Philemon Kingdom, MD  Alcohol Swabs (DROPSAFE ALCOHOL PREP) 70 % PADS USE TO CLEAN FINGER PRIOR TO TESTING FOR BLOOD SUGAR  AS DIRECTED 05/20/21   Philemon Kingdom, MD  Blood Glucose Calibration (ACCU-CHEK GUIDE CONTROL) LIQD USE AS DIRECTED 12/13/20   Philemon Kingdom, MD  blood glucose meter kit and supplies Dispense based on patient and insurance preference. Use up to four times daily as directed. (FOR ICD-10 E10.9, E11.9). 06/27/19   Philemon Kingdom, MD  Continuous Blood Gluc Sensor (FREESTYLE LIBRE 14 DAY SENSOR) MISC 1 each by Does not apply route every 14 (fourteen) days. Change every 2 weeks 06/24/19   Philemon Kingdom, MD  DROPLET PEN NEEDLES 31G X 8 MM MISC USE FOR INJECTING INSULIN 4 TIMES DAILY. 09/02/21   Philemon Kingdom, MD  Misc. Devices (MATTRESS PAD) MISC FIRM MATTRESS PAD for hospital bed x 1  02/22/21   [name], MD    Physical Exam: Vitals:   10/03/21 2056 10/03/21 2100 10/03/21 2216 10/04/21 0158  BP:  (!) 114/57 (!) 165/60 (!) 132/55  Pulse: 60 61 71 62  Resp: (!) 30 (!) 24 20 18   Temp:   98.9 F (37.2 C) 97.8 F (36.6 C)  TempSrc:    Oral  SpO2: 97% 99% 100% 100%  Weight:   74.4 kg   Height:       1.  General: Patient lying supine in bed,  no acute distress   2. Psychiatric: Hypersomnolent and nonverbal    3. Neurologic: Opens eyes to light touch, makes incomprehensible mumbling noises, goes immediately back to sleep, protecting airway, not following commands   4. HEENMT:  Head is atraumatic, normocephalic, pupils reactive to light, neck is supple, trachea is midline, mucous membranes are moist   5. Respiratory : Lungs are clear  to auscultation bilaterally without wheezing, rhonchi, rales, no cyanosis, no increase in work of breathing or accessory muscle use   6. Cardiovascular : Heart rate normal, rhythm is regular, no murmurs, rubs or gallops, no peripheral edema, peripheral pulses palpated   7. Gastrointestinal:  Abdomen is soft, nondistended, nontender to palpation bowel sounds active, no masses or organomegaly palpated   8. Skin:  Skin is warm, dry and intact without rashes, acute lesions, or ulcers on limited exam   9.Musculoskeletal:  No acute deformities or trauma, no asymmetry in tone, no peripheral edema, peripheral pulses palpated, no tenderness to palpation in the extremities  Data Reviewed: In the ED Temp 98.3, heart rate 57-62, respiratory rate 17-21, blood pressure 101/47-125/55, satting at 97% pH 7.3, PO2 44 No leukocytosis Chemistry shows a BUN of 37 and a creatinine 1.74 CPK 270 Trope 5 UA is not indicative of UTI CT head has chronic changes but no acute intracranial abnormality Chest x-ray shows no active disease EKG shows a heart rate of 60, sinus rhythm, QTc 386 Patient was given 500 normal bolus of normal  saline Admission requested for acute metabolic encephalopathy and generalized weakness  Assessment and Plan: * Generalized weakness - Generalized weakness with recurrent falls -Check TSH, B12, vitamin D in the a.m. -Thiamine is in process -Start high-dose thiamine -PT eval and treat -Continue to monitor  Mood disorder (HCC) -Continue Wellbutrin, currently holding sedating medications given patient's altered mental status including Ativan, Risperdal, trazodone -Continue Lamictal for mood stabilization  Acute renal failure superimposed on stage 3a chronic kidney disease (HCC) - Creatinine increased to 1.74 from 1.2 at last discharge -Most likely secondary to dehydration as patient was found down -Continue IV fluids -Hold nephrotoxic agents when possible -Continue to monitor  Acute metabolic encephalopathy - Patient is hypersomnolent -Does have a history of hypersomnolence disorder on her problem list -CT head shows no acute changes -No signs of infection -Electrolytes are within normal range -VBG shows PCO2 of 45, pH 7.32 -Polypharmacy??  Holding Lyrica, Risperdal, trazodone, Ativan -Patient is protecting airway, responds to light touch -Continue to monitor  Acquired hypothyroidism - Given generalized weakness we will check TSH in a.m. -Continue Synthroid  Recurrent falls - See plan for generalized weakness  DMII (diabetes mellitus, type 2) (HCC) - Continue to monitor CBGs -Sliding scale coverage  Essential hypertension - Continue metoprolol  Mixed hyperlipidemia - Continue Zetia      Advance Care Planning:   Code Status: Full Code   Consults: None  Family Communication: No family at bedside  Severity of Illness: The appropriate patient status for this patient is OBSERVATION. Observation status is judged to be reasonable and necessary in order to provide the required intensity of service to ensure the patient's safety. The patient's presenting symptoms,  physical exam findings, and initial radiographic and laboratory data in the context of their medical condition is felt to place them at decreased risk for further clinical deterioration. Furthermore, it is anticipated that the patient will be medically stable for discharge from the hospital within 2 midnights of admission.   Author: Rolla Plate, DO 10/04/2021 2:18 AM  For on call review [email or web address].

## 2021-10-04 NOTE — Assessment & Plan Note (Addendum)
-   check TSH >>> normal at 0.49 -Continue Synthroid

## 2021-10-05 ENCOUNTER — Inpatient Hospital Stay (HOSPITAL_COMMUNITY): Payer: Medicare HMO

## 2021-10-05 ENCOUNTER — Telehealth: Payer: Self-pay

## 2021-10-05 ENCOUNTER — Ambulatory Visit (HOSPITAL_COMMUNITY): Payer: Medicare HMO | Admitting: Physician Assistant

## 2021-10-05 DIAGNOSIS — N179 Acute kidney failure, unspecified: Secondary | ICD-10-CM

## 2021-10-05 DIAGNOSIS — D638 Anemia in other chronic diseases classified elsewhere: Secondary | ICD-10-CM | POA: Diagnosis not present

## 2021-10-05 DIAGNOSIS — N1831 Chronic kidney disease, stage 3a: Secondary | ICD-10-CM

## 2021-10-05 DIAGNOSIS — E782 Mixed hyperlipidemia: Secondary | ICD-10-CM | POA: Diagnosis not present

## 2021-10-05 DIAGNOSIS — R531 Weakness: Secondary | ICD-10-CM | POA: Diagnosis not present

## 2021-10-05 LAB — CBC
HCT: 28.7 % — ABNORMAL LOW (ref 36.0–46.0)
Hemoglobin: 8.2 g/dL — ABNORMAL LOW (ref 12.0–15.0)
MCH: 23 pg — ABNORMAL LOW (ref 26.0–34.0)
MCHC: 28.6 g/dL — ABNORMAL LOW (ref 30.0–36.0)
MCV: 80.6 fL (ref 80.0–100.0)
Platelets: 216 10*3/uL (ref 150–400)
RBC: 3.56 MIL/uL — ABNORMAL LOW (ref 3.87–5.11)
RDW: 15.3 % (ref 11.5–15.5)
WBC: 4.8 10*3/uL (ref 4.0–10.5)
nRBC: 0 % (ref 0.0–0.2)

## 2021-10-05 LAB — RESP PANEL BY RT-PCR (FLU A&B, COVID) ARPGX2
Influenza A by PCR: NEGATIVE
Influenza B by PCR: NEGATIVE
SARS Coronavirus 2 by RT PCR: NEGATIVE

## 2021-10-05 LAB — BASIC METABOLIC PANEL
Anion gap: 6 (ref 5–15)
BUN: 21 mg/dL (ref 8–23)
CO2: 20 mmol/L — ABNORMAL LOW (ref 22–32)
Calcium: 8.2 mg/dL — ABNORMAL LOW (ref 8.9–10.3)
Chloride: 116 mmol/L — ABNORMAL HIGH (ref 98–111)
Creatinine, Ser: 1.01 mg/dL — ABNORMAL HIGH (ref 0.44–1.00)
GFR, Estimated: 60 mL/min — ABNORMAL LOW (ref >60)
Glucose, Bld: 100 mg/dL — ABNORMAL HIGH (ref 70–99)
Potassium: 3.8 mmol/L (ref 3.5–5.1)
Sodium: 142 mmol/L (ref 135–145)

## 2021-10-05 LAB — GLUCOSE, CAPILLARY
Glucose-Capillary: 107 mg/dL — ABNORMAL HIGH (ref 70–99)
Glucose-Capillary: 154 mg/dL — ABNORMAL HIGH (ref 70–99)

## 2021-10-05 NOTE — TOC Transition Note (Signed)
Transition of Care [facility]) - CM/SW Discharge Note   Patient Details  Name: [name] MRN: [id number] Date of Birth: 06/18/1950  [facility] of [facility]) CM/SW Contact:  Ihor Gully, LCSW Phone Number: 10/05/2021, 3:33 PM   Clinical Narrative:    [name] with [facility] notified of patient discharge. [facility] is active with patient.      Barriers to Discharge: No Barriers Identified   Patient Goals and CMS Choice Patient states their goals for this hospitalization and ongoing recovery are:: return home      Discharge Placement                       Discharge Plan and Services     Post Acute Care Choice: Home Health                               Social Determinants of Health (SDOH) Interventions     Readmission Risk Interventions    09/16/2021   10:07 AM  Readmission Risk Prevention Plan  Transportation Screening Complete  Medication Review (RN Care Manager) Complete  HRI or [place] Complete  SW Recovery Care/Counseling Consult Complete  Palliative Care Screening Not [place] Patient Refused

## 2021-10-05 NOTE — Discharge Summary (Signed)
Physician Discharge Summary   Patient: [name] MRN: [id number] DOB: 09/29/1950  Admit date:     10/03/2021  Discharge date: 10/05/21  Discharge Physician:     PCP: [name], Margaret E, MD   Recommendations at discharge:   Please follow up with primary care provider within 1-2 weeks  Please repeat BMP and CBC in one week    Hospital Course: [name] is a 71 y.o. female with medical history significant of anemia, anxiety, bipolar disorder, cancer, COPD, GERD, hyperlipidemia, hypertension, hypothyroidism, seizures, paralysis, stroke, and more presents ED with a chief complaint of generalized weakness.  Patient has home health, who found her in the bathtub.  Apparently she was trying to something in the bathroom when she fell backwards into the bathtub and was too weak to get up.  Patient's home health seems to think that patient has been declining since her last admission.  Patient herself, is not able to give any history.  She is hypersomnolent.  She opens her eyes to light touch, but will not speak and goes back to sleep.  She is protecting her airway.  No further history could be obtained.    ED Temp 98.3, heart rate 57-62, respiratory rate 17-21, blood pressure 101/47-125/55, satting at 97% pH 7.3, PO2 44 No leukocytosis Chemistry shows a BUN of 37 and a creatinine 1.74 CPK 270 Trope 5 UA is not indicative of UTI CT head has chronic changes but no acute intracranial abnormality Chest x-ray shows no active disease EKG shows a heart rate of 60, sinus rhythm, QTc 386 Patient was given 500 normal bolus of normal saline Admission requested for acute metabolic encephalopathy and generalized weakness    Assessment and Plan: * Generalized weakness - Generalized weakness with recurrent falls - TSH, B12, vitamin D >>> all within normal limits  -Start high-dose thiamine -PT eval and treat -Continue to monitor  Mood disorder (HCC) -Continue Wellbutrin, - currently  holding sedating medications given patient's altered mental status including Ativan, Risperdal, trazodone -Continue Lamictal for mood stabilization  Acute renal failure superimposed on stage 3a chronic kidney disease (HCC) - Creatinine increased to 1.74 from 1.2 at last discharge -Most likely secondary to dehydration  -Patient was also found down, CKD elevated at 270  -Continue IV fluids -Hold nephrotoxic agents when possible -Continue to monitor  Anemia of chronic disease Hemoglobin 8.7>>>7.9 today Likely hemodilution, monitoring closely Check an iron level No signs of bleeding  Acute metabolic encephalopathy - Patient remained hypersomnolent, but now arousable, follows commands appropriate  -Does have a history of hypersomnolence disorder on her problem list  -CT head shows no acute changes -No signs of infection -Electrolytes are within normal range -VBG shows PCO2 of 45, pH 7.32 -Polypharmacy??  Holding Lyrica, Risperdal, trazodone, Ativan -Patient is protecting airway, responds to light touch -Continue to monitor -We will continue to investigate underlying causes -PT OT evaluation -Neurochecks  Acquired hypothyroidism - Given generalized weakness  -  check TSH >>> normal at 0.49 -Continue Synthroid   Recurrent falls - See plan for generalized weakness  DMII (diabetes mellitus, type 2) (HCC) - Continue to monitor CBGs -Sliding scale coverage  Essential hypertension - Continue metoprolol  Mixed hyperlipidemia - Continue Zetia         Consultants: none Procedures performed: none  Disposition: Home Diet recommendation:  Carb modified diet DISCHARGE MEDICATION: Allergies as of 10/05/2021       Reactions   Iron Nausea And Vomiting   And itching And itching     Milk (cow) Rash   Doesn't agree with stomach.  Doesn't agree with stomach.  Doesn't agree with stomach.    Penicillins Hives   Has patient had a PCN reaction causing immediate rash,  facial/tongue/throat swelling, SOB or lightheadedness with hypotension: Yes Has patient had a PCN reaction causing severe rash involving mucus membranes or skin necrosis: No Has patient had a PCN reaction that required hospitalization No Has patient had a PCN reaction occurring within the last 10 years: No If all of the above answers are "NO", then may proceed with Cephalosporin use. Other reaction(s): Other (see comments) Has patient had a PCN reaction causing immediate rash, facial/tongue/throat swelling, SOB or lightheadedness with hypotension: Yes Has patient had a PCN reaction causing severe rash involving mucus membranes or skin necrosis: No Has patient had a PCN reaction that required hospitalization No Has patient had a PCN reaction occurring within the last 10 years: No If all of the above answers are "NO", then may proceed with Cephalosporin use. Has patient had a PCN reaction causing immediate rash, facial/tongue/throat swelling, SOB or lightheadedness with hypotension: Yes Has patient had a PCN reaction causing severe rash involving mucus membranes or skin necrosis: No Has patient had a PCN reaction that required hospitalization No Has patient had a PCN reaction occurring within the last 10 years: No If all of the above answers are "NO", then may proceed with Cephalosporin use. Has patient had a PCN reaction causing immediate rash, facial/tongue/throat swelling, SOB or lightheadedness with hypotension: Yes Has patient had a PCN reaction causing severe rash involving mucus membranes or skin necrosis: No Has patient had a PCN reaction that required hospitalization No Has patient had a PCN reaction occurring within the last 10 years: No If all of the above answers are "NO", then may proceed with Cephalosporin use. Has patient had a PCN reaction causing immediate rash, facial/tongue/throat swelling, SOB or lightheadedness with hypotension: Yes Has patient had a PCN reaction causing  sever... (TRUNCATED)   Phenazopyridine Hives   Phenazopyridine Hcl Hives   Cephalexin Hives   Flonase [fluticasone]    "It gave me ulcers in my nose"   Milk-related Compounds Other (See Comments)   Doesn't agree with stomach.    Phenazopyridine Hcl Hives             Medication List     TAKE these medications    Accu-Chek Guide Control Liqd USE AS DIRECTED   Accu-Chek Guide test strip Generic drug: glucose blood TEST BLOOD SUGAR FOUR TIMES DAILY  AND AS NEEDED   Accu-Chek Softclix Lancets lancets TEST BLOOD SUGAR THREE TIMES DAILY AS DIRECTED   albuterol 108 (90 Base) MCG/ACT inhaler Commonly known as: VENTOLIN HFA INHALE 1 TO 2 PUFFS EVERY 6 HOURS AS NEEDED FOR WHEEZING, SHORTNESS OF BREATH What changed: See the new instructions.   alendronate 70 MG tablet Commonly known as: FOSAMAX TAKE 1 TABLET (70 MG TOTAL) BY MOUTH EVERY 7 (SEVEN) DAYS. TAKE WITH A FULL GLASS OF WATER ON AN EMPTY STOMACH.   amLODipine 10 MG tablet Commonly known as: NORVASC TAKE 1 TABLET EVERY DAY   ascorbic acid 500 MG tablet Commonly known as: VITAMIN C Take 500 mg by mouth daily.   aspirin EC 81 MG tablet Take 1 tablet (81 mg total) by mouth daily with breakfast.   blood glucose meter kit and supplies Dispense based on patient and insurance preference. Use up to four times daily as directed. (FOR ICD-10 E10.9, E11.9).   buPROPion 150 MG   24 hr tablet Commonly known as: Wellbutrin XL Take 1 tablet (150 mg total) by mouth every morning.   Caltrate 600+D3 Soft 600-20 MG-MCG Chew Generic drug: Calcium Carb-Cholecalciferol Chew 1 tablet by mouth daily at 12 noon.   cetirizine 10 MG tablet Commonly known as: ZYRTEC Take 1 tablet (10 mg total) by mouth daily.   Clobetasol Prop Emollient Base 0.05 % emollient cream Commonly known as: Clobetasol Propionate E Apply to affected area qd What changed:  how much to take how to take this when to take this additional instructions   D3  PO Take 1 tablet by mouth daily.   diclofenac Sodium 1 % Gel Commonly known as: VOLTAREN APPLY 2 GRAMS TOPICALLY FOUR TIMES DAILY. What changed: See the new instructions.   Droplet Pen Needles 31G X 8 MM Misc Generic drug: Insulin Pen Needle USE FOR INJECTING INSULIN 4 TIMES DAILY.   DropSafe Alcohol Prep 70 % Pads USE TO CLEAN FINGER PRIOR TO TESTING FOR BLOOD SUGAR  AS DIRECTED   DSS 100 MG Caps Take 1 tablet by mouth in the morning and at bedtime.   ezetimibe 10 MG tablet Commonly known as: ZETIA TAKE 1 TABLET EVERY DAY   FISH OIL PO Take 1 capsule by mouth daily.   FreeStyle Libre 14 Day Sensor Misc 1 each by Does not apply route every 14 (fourteen) days. Change every 2 weeks   lamoTRIgine 100 MG tablet Commonly known as: LAMICTAL Take 1 tablet (100 mg total) by mouth 2 (two) times daily.   levothyroxine 50 MCG tablet Commonly known as: SYNTHROID TAKE 1 TABLET EVERY DAY FOR 6 DAYS PER WEEK, 1/2 TABLET 1 DAY PER WEEK   LORazepam 0.5 MG tablet Commonly known as: Ativan Take 1 tablet (0.5 mg total) by mouth 2 (two) times daily.   Mattress Pad Misc FIRM MATTRESS PAD for hospital bed x 1   meclizine 25 MG tablet Commonly known as: ANTIVERT Take 1 tablet (25 mg total) by mouth 3 (three) times daily as needed for dizziness.   metoprolol tartrate 50 MG tablet Commonly known as: LOPRESSOR TAKE 1 TABLET TWICE DAILY (NEED MD APPOINTMENT) What changed: See the new instructions.   mirabegron ER 25 MG Tb24 tablet Commonly known as: MYRBETRIQ Take 1 tablet (25 mg total) by mouth daily.   montelukast 10 MG tablet Commonly known as: SINGULAIR TAKE 1 TABLET EVERY DAY What changed: when to take this   MULTIVITAMIN GUMMIES ADULT PO Take 1 tablet by mouth daily.   NovoLOG FlexPen 100 UNIT/ML FlexPen Generic drug: insulin aspart Inject 5 Units into the skin 3 (three) times daily with meals. Give if eats 50% or more of meal. What changed: how much to take   nystatin  powder Commonly known as: MYCOSTATIN/NYSTOP APPLY TO AFFECTED AREA 4 TIMES DAILY. What changed: See the new instructions.   ondansetron 4 MG tablet Commonly known as: Zofran Take 1 tablet (4 mg total) by mouth every 8 (eight) hours as needed for nausea or vomiting.   oxyCODONE-acetaminophen 10-325 MG tablet Commonly known as: PERCOCET Take 1 tablet by mouth every 8 (eight) hours as needed for pain.   pregabalin 75 MG capsule Commonly known as: LYRICA Take 1 capsule (75 mg total) by mouth daily.   RABEprazole 20 MG tablet Commonly known as: ACIPHEX Take 1 tablet (20 mg total) by mouth 2 (two) times daily.   Restasis 0.05 % ophthalmic emulsion Generic drug: cycloSPORINE Place 2 drops into both eyes daily.   risperiDONE 0.5  MG tablet Commonly known as: RISPERDAL Take 1 tablet (0.5 mg total) by mouth at bedtime.   sertraline 100 MG tablet Commonly known as: ZOLOFT Take 1 tablet (100 mg total) by mouth every morning.   Stiolto Respimat 2.5-2.5 MCG/ACT Aers Generic drug: Tiotropium Bromide-Olodaterol INHALE 2 PUFFS INTO THE LUNGS DAILY.   Toujeo Max SoloStar 300 UNIT/ML Solostar Pen Generic drug: insulin glargine (2 Unit Dial) Inject 20-25 Units into the skin daily.   traZODone 150 MG tablet Commonly known as: DESYREL Take 1 tablet (150 mg total) by mouth at bedtime.   vitamin E 180 MG (400 UNITS) capsule Generic drug: vitamin E Take 400 Units by mouth daily.        Discharge Exam: Filed Weights   10/03/21 1347 10/03/21 2216  Weight: 72.6 kg 74.4 kg   HEENT:  [place]/AT, No thrush, no icterus CV:  RRR, no rub, no S3, no S4 Lung:  CTA, no wheeze, no rhonchi Abd:  soft/+BS, NT Ext:  No edema, no lymphangitis, no synovitis, no rash   Condition at discharge: stable  The results of significant diagnostics from this hospitalization (including imaging, microbiology, ancillary and laboratory) are listed below for reference.   Imaging Studies: CT Head Wo  Contrast  Result Date: 10/03/2021 CLINICAL DATA:  Weakness.  Fall. EXAM: CT HEAD WITHOUT CONTRAST TECHNIQUE: Contiguous axial images were obtained from the base of the skull through the vertex without intravenous contrast. RADIATION DOSE REDUCTION: This exam was performed according to the departmental dose-optimization program which includes automated exposure control, adjustment of the mA and/or kV according to patient size and/or use of iterative reconstruction technique. COMPARISON:  09/15/2021 FINDINGS: Brain: Stable chronic bifrontal encephalomalacia underlying site of prior bifrontal craniotomy. Periventricular white matter and corona radiata hypodensities favor chronic ischemic microvascular white matter disease. Otherwise, the brainstem, cerebellum, cerebral peduncles, thalamus, basal ganglia, basilar cisterns, and ventricular system appear within normal limits. No intracranial hemorrhage, mass lesion, or acute CVA. Vascular: There is atherosclerotic calcification of the cavernous carotid arteries bilaterally. Skull: Prior bifrontal craniotomy. Arthropathy of the temporomandibular joints with subcortical cyst formation of the right mandibular condyle and some spurring of both mandibular condyles. Sinuses/Orbits: Unremarkable Other: No supplemental non-categorized findings. IMPRESSION: 1. No acute intracranial findings. 2. Chronic bifrontal encephalomalacia underlying a region of prior craniotomy. 3. Periventricular white matter and corona radiata hypodensities favor chronic ischemic microvascular white matter disease. Electronically Signed   By: Walter  Liebkemann M.D.   On: 10/03/2021 16:41   DG Chest Port 1 View  Result Date: 10/03/2021 CLINICAL DATA:  Weakness EXAM: PORTABLE CHEST 1 VIEW COMPARISON:  05/17/2021 FINDINGS: Low lung volumes. No acute airspace disease or effusion. Mild cardiomegaly. No pneumothorax. IMPRESSION: No active disease.  Low lung volumes. Electronically Signed   By: Kim   Fujinaga M.D.   On: 10/03/2021 15:45   CT Cervical Spine Wo Contrast  Result Date: 09/15/2021 CLINICAL DATA:  Status post fall due to weakness. EXAM: CT CERVICAL SPINE WITHOUT CONTRAST TECHNIQUE: Multidetector CT imaging of the cervical spine was performed without intravenous contrast. Multiplanar CT image reconstructions were also generated. RADIATION DOSE REDUCTION: This exam was performed according to the departmental dose-optimization program which includes automated exposure control, adjustment of the mA and/or kV according to patient size and/or use of iterative reconstruction technique. COMPARISON:  October 16, 2020 FINDINGS: Alignment: There is straightening of the normal cervical spine lordosis. Skull base and vertebrae: No acute fracture. No primary bone lesion or focal pathologic process. Soft tissues and spinal canal: No   prevertebral fluid or swelling. No visible canal hematoma. Disc levels: Moderate to marked severity endplate sclerosis and moderate severity anterior osteophyte formation are seen at the levels of C4-C5, C5-C6, C6-C7 and C7-T1. There is moderate severity narrowing of the anterior atlantoaxial articulation. Moderate to marked severity intervertebral disc space narrowing is seen at C5-C6, C6-C7 and C7-T1. Mild, bilateral multilevel facet joint hypertrophy is noted. Upper chest: Negative. Other: None. IMPRESSION: 1. No acute fracture or subluxation of the cervical spine. 2. Moderate to marked severity multilevel degenerative changes, as described above. Electronically Signed   By: Thaddeus  Houston M.D.   On: 09/15/2021 22:26   CT Head Wo Contrast  Result Date: 09/15/2021 CLINICAL DATA:  Status post fall due to weakness. EXAM: CT HEAD WITHOUT CONTRAST TECHNIQUE: Contiguous axial images were obtained from the base of the skull through the vertex without intravenous contrast. RADIATION DOSE REDUCTION: This exam was performed according to the departmental dose-optimization program which  includes automated exposure control, adjustment of the mA and/or kV according to patient size and/or use of iterative reconstruction technique. COMPARISON:  November 24, 2020 FINDINGS: Brain: There is mild cerebral atrophy with widening of the extra-axial spaces and ventricular dilatation. There are areas of decreased attenuation within the white matter tracts of the supratentorial brain, consistent with microvascular disease changes. Stable areas of bifrontal encephalomalacia are noted. A small amount of residual meningioma is again noted within the planum sphenoidale. Vascular: Bilateral cavernous carotid artery calcification is present. Skull: A midline frontal craniotomy defect is seen. Sinuses/Orbits: No acute finding. Other: None. IMPRESSION: 1. Generalized cerebral atrophy and chronic white matter small vessel ischemic changes, without an acute intracranial abnormality. 2. 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 3. Stable small amount of residual meningioma within the planum sphenoidale. Electronically Signed   By: Thaddeus  Houston M.D.   On: 09/15/2021 22:22    Microbiology: Results for orders placed or performed during the hospital encounter of 10/03/21  Resp Panel by RT-PCR (Flu A&B, Covid) Anterior Nasal Swab     Status: None   Collection Time: 10/05/21  1:19 AM   Specimen: Anterior Nasal Swab  Result Value Ref Range Status   SARS Coronavirus 2 by RT PCR NEGATIVE NEGATIVE Final    Comment: (NOTE) SARS-CoV-2 target nucleic acids are NOT DETECTED.  The SARS-CoV-2 RNA is generally detectable in upper respiratory specimens during the acute phase of infection. The lowest concentration of SARS-CoV-2 viral copies this assay can detect is 138 copies/mL. A negative result does not preclude SARS-Cov-2 infection and should not be used as the sole basis for treatment or other patient management decisions. A negative result may occur with  improper specimen  collection/handling, submission of specimen other than nasopharyngeal swab, presence of viral mutation(s) within the areas targeted by this assay, and inadequate number of viral copies(<138 copies/mL). A negative result must be combined with clinical observations, patient history, and epidemiological information. The expected result is Negative.  Fact Sheet for Patients:  https://www.fda.gov/media/152166/download  Fact Sheet for Healthcare Providers:  https://www.fda.gov/media/152162/download  This test is no t yet approved or cleared by the [place] FDA and  has been authorized for detection and/or diagnosis of SARS-CoV-2 by FDA under an Emergency Use Authorization (EUA). This EUA will remain  in effect (meaning this test can be used) for the duration of the COVID-19 declaration under Section 564(b)(1) of the Act, 21 U.S.C.section 360bbb-3(b)(1), unless the authorization is terminated  or revoked sooner.       Influenza A   by PCR NEGATIVE NEGATIVE Final   Influenza B by PCR NEGATIVE NEGATIVE Final    Comment: (NOTE) The Xpert Xpress SARS-CoV-2/FLU/RSV plus assay is intended as an aid in the diagnosis of influenza from Nasopharyngeal swab specimens and should not be used as a sole basis for treatment. Nasal washings and aspirates are unacceptable for Xpert Xpress SARS-CoV-2/FLU/RSV testing.  Fact Sheet for Patients: EntrepreneurPulse.com.au  Fact Sheet for Healthcare Providers: IncredibleEmployment.be  This test is not yet approved or cleared by the [place] FDA and has been authorized for detection and/or diagnosis of SARS-CoV-2 by FDA under an Emergency Use Authorization (EUA). This EUA will remain in effect (meaning this test can be used) for the duration of the COVID-19 declaration under Section 564(b)(1) of the Act, 21 U.S.C. section 360bbb-3(b)(1), unless the authorization is terminated or revoked.  Performed at [facility], [address] [place], [place] [address]    *Note: Due to a large number of results and/or encounters for the requested time period, some results have not been displayed. A complete set of results can be found in Results Review.    Labs: CBC: Recent Labs  Lab 10/03/21 1354 10/04/21 0449 10/05/21 0507  WBC 9.0 5.7 4.8  NEUTROABS  --  2.7  --   HGB 8.7* 7.9* 8.2*  HCT 30.9* 27.8* 28.7*  MCV 80.5 80.3 80.6  PLT 265 227 809   Basic Metabolic Panel: Recent Labs  Lab 10/03/21 1354 10/04/21 0449 10/05/21 0507  NA 141 143 142  K 3.6 3.7 3.8  CL 113* 116* 116*  CO2 22 22 20*  GLUCOSE 102* 117* 100*  BUN 37* 39* 21  CREATININE 1.74* 1.79* 1.01*  CALCIUM 9.1 8.4* 8.2*  MG  --  2.1  --    Liver Function Tests: Recent Labs  Lab 10/04/21 0449  AST 16  ALT 20  ALKPHOS 62  BILITOT 0.4  PROT 5.7*  ALBUMIN 3.0*   CBG: Recent Labs  Lab 10/04/21 1200 10/04/21 1655 10/04/21 2123 10/05/21 0710 10/05/21 1108  GLUCAP 203* 92 102* 107* 154*    Discharge time spent: greater than 30 minutes.  Signed: Orson Eva, MD [facility] 10/05/2021

## 2021-10-05 NOTE — Telephone Encounter (Signed)
Erroneous entry

## 2021-10-05 NOTE — Care Management Important Message (Signed)
Important Message  Patient Details  Name: [name] MRN: [id number] Date of Birth: 1950-07-29   Medicare Important Message Given:  N/A - LOS <3 / Initial given by admissions     Tommy Medal 10/05/2021, 11:57 AM

## 2021-10-06 ENCOUNTER — Ambulatory Visit (INDEPENDENT_AMBULATORY_CARE_PROVIDER_SITE_OTHER): Payer: Medicare HMO | Admitting: *Deleted

## 2021-10-06 ENCOUNTER — Telehealth: Payer: Self-pay

## 2021-10-06 DIAGNOSIS — I10 Essential (primary) hypertension: Secondary | ICD-10-CM

## 2021-10-06 DIAGNOSIS — E1143 Type 2 diabetes mellitus with diabetic autonomic (poly)neuropathy: Secondary | ICD-10-CM

## 2021-10-06 DIAGNOSIS — J449 Chronic obstructive pulmonary disease, unspecified: Secondary | ICD-10-CM

## 2021-10-06 NOTE — Telephone Encounter (Signed)
Transition Care Management Follow-up Telephone Call Date of discharge and from where: 10/05/21 [facility] How have you been since you were released from the hospital? good Any questions or concerns? no  Items Reviewed: Did the pt receive and understand the discharge instructions provided? Yes  Medications obtained and verified? Yes  Other?  N/a Any new allergies since your discharge? NO Dietary orders reviewed? Yes Do you have support at home? Yes   Home Care and Equipment/Supplies: Were home health services ordered? yes If so, what is the name of the agency? N/A  Has the agency set up a time to come to the patient's home? yes Were any new equipment or medical supplies ordered?  No What is the name of the medical supply agency? N/A Were you able to get the supplies/equipment? not applicable Do you have any questions related to the use of the equipment or supplies? No  Functional Questionnaire: (I = Independent and D = Dependent) ADLs: I  Bathing/Dressing- I  Meal Prep- I  Eating- I  Maintaining continence- I  Transferring/Ambulation- I  Managing Meds- I  Follow up appointments reviewed:  PCP Hospital f/u appt confirmed? Yes  Scheduled to see Posey Pronto on 10/18/21. [facility] f/u appt confirmed? No   Are transportation arrangements needed? No  If their condition worsens, is the pt aware to call PCP or go to the Emergency Dept.? Yes Was the patient provided with contact information for the PCP's office or ED? Yes Was to pt encouraged to call back with questions or concerns? Yes

## 2021-10-06 NOTE — Chronic Care Management (AMB) (Signed)
Chronic Care Management   CCM RN Visit Note  10/06/2021 Name: [name] MRN: [id number] DOB: Dec 21, 1950  Subjective: [name] is a 71 y.o. year old female who is a primary care patient of [name], MD. The care management team was consulted for assistance with disease management and care coordination needs.    Engaged with patient by telephone for follow up visit in response to provider referral for case management and/or care coordination services.   Consent to Services:  The patient was given information about Chronic Care Management services, agreed to services, and gave verbal consent prior to initiation of services.  Please see initial visit note for detailed documentation.   Patient agreed to services and verbal consent obtained.   Assessment: Review of patient past medical history, allergies, medications, health status, including review of consultants reports, laboratory and other test data, was performed as part of comprehensive evaluation and provision of chronic care management services.   SDOH (Social Determinants of Health) assessments and interventions performed:    CCM Care Plan  Allergies  Allergen Reactions   Iron Nausea And Vomiting    And itching And itching   Milk (Cow) Rash    Doesn't agree with stomach.  Doesn't agree with stomach.  Doesn't agree with stomach.    Penicillins Hives    Has patient had a PCN reaction causing immediate rash, facial/tongue/throat swelling, SOB or lightheadedness with hypotension: Yes Has patient had a PCN reaction causing severe rash involving mucus membranes or skin necrosis: No Has patient had a PCN reaction that required hospitalization No Has patient had a PCN reaction occurring within the last 10 years: No If all of the above answers are "NO", then may proceed with Cephalosporin use.  Other reaction(s): Other (see comments) Has patient had a PCN reaction causing immediate rash, facial/tongue/throat  swelling, SOB or lightheadedness with hypotension: Yes Has patient had a PCN reaction causing severe rash involving mucus membranes or skin necrosis: No Has patient had a PCN reaction that required hospitalization No Has patient had a PCN reaction occurring within the last 10 years: No If all of the above answers are "NO", then may proceed with Cephalosporin use. Has patient had a PCN reaction causing immediate rash, facial/tongue/throat swelling, SOB or lightheadedness with hypotension: Yes Has patient had a PCN reaction causing severe rash involving mucus membranes or skin necrosis: No Has patient had a PCN reaction that required hospitalization No Has patient had a PCN reaction occurring within the last 10 years: No If all of the above answers are "NO", then may proceed with Cephalosporin use. Has patient had a PCN reaction causing immediate rash, facial/tongue/throat swelling, SOB or lightheadedness with hypotension: Yes Has patient had a PCN reaction causing severe rash involving mucus membranes or skin necrosis: No Has patient had a PCN reaction that required hospitalization No Has patient had a PCN reaction occurring within the last 10 years: No If all of the above answers are "NO", then may proceed with Cephalosporin use. Has patient had a PCN reaction causing immediate rash, facial/tongue/throat swelling, SOB or lightheadedness with hypotension: Yes Has patient had a PCN reaction causing sever... (TRUNCATED)   Phenazopyridine Hives   Phenazopyridine Hcl Hives   Cephalexin Hives   Flonase [Fluticasone]     "It gave me ulcers in my nose"   Milk-Related Compounds Other (See Comments)    Doesn't agree with stomach.    Phenazopyridine Hcl Hives  Outpatient Encounter Medications as of 10/06/2021  Medication Sig Note   ACCU-CHEK GUIDE test strip TEST BLOOD SUGAR FOUR TIMES DAILY  AND AS NEEDED    Accu-Chek Softclix Lancets lancets TEST BLOOD SUGAR THREE TIMES DAILY AS DIRECTED     albuterol (VENTOLIN HFA) 108 (90 Base) MCG/ACT inhaler INHALE 1 TO 2 PUFFS EVERY 6 HOURS AS NEEDED FOR WHEEZING, SHORTNESS OF BREATH (Patient taking differently: Inhale 1-2 puffs into the lungs every 6 (six) hours as needed for wheezing or shortness of breath.)    Alcohol Swabs (DROPSAFE ALCOHOL PREP) 70 % PADS USE TO CLEAN FINGER PRIOR TO TESTING FOR BLOOD SUGAR  AS DIRECTED    alendronate (FOSAMAX) 70 MG tablet TAKE 1 TABLET (70 MG TOTAL) BY MOUTH EVERY 7 (SEVEN) DAYS. TAKE WITH A FULL GLASS OF WATER ON AN EMPTY STOMACH. 09/23/2021: Sunday    amLODipine (NORVASC) 10 MG tablet TAKE 1 TABLET EVERY DAY (Patient taking differently: Take 10 mg by mouth daily.)    ascorbic acid (VITAMIN C) 500 MG tablet Take 500 mg by mouth daily.    aspirin EC 81 MG tablet Take 1 tablet (81 mg total) by mouth daily with breakfast.    Blood Glucose Calibration (ACCU-CHEK GUIDE CONTROL) LIQD USE AS DIRECTED    blood glucose meter kit and supplies Dispense based on patient and insurance preference. Use up to four times daily as directed. (FOR ICD-10 E10.9, E11.9).    buPROPion (WELLBUTRIN XL) 150 MG 24 hr tablet Take 1 tablet (150 mg total) by mouth every morning.    Calcium Carb-Cholecalciferol (CALTRATE 600+D3 SOFT) 600-20 MG-MCG CHEW Chew 1 tablet by mouth daily at 12 noon.    cetirizine (ZYRTEC) 10 MG tablet Take 1 tablet (10 mg total) by mouth daily.    Cholecalciferol (D3 PO) Take 1 tablet by mouth daily. 09/16/2021: Pt does not know how many units   Clobetasol Prop Emollient Base (CLOBETASOL PROPIONATE E) 0.05 % emollient cream Apply to affected area qd (Patient taking differently: Apply 1 Application topically daily.)    Continuous Blood Gluc Sensor (FREESTYLE LIBRE 14 DAY SENSOR) MISC 1 each by Does not apply route every 14 (fourteen) days. Change every 2 weeks    diclofenac Sodium (VOLTAREN) 1 % GEL APPLY 2 GRAMS TOPICALLY FOUR TIMES DAILY. (Patient taking differently: Apply 2 g topically 4 (four) times daily.)     Docusate Sodium (DSS) 100 MG CAPS Take 1 tablet by mouth in the morning and at bedtime.    DROPLET PEN NEEDLES 31G X 8 MM MISC USE FOR INJECTING INSULIN 4 TIMES DAILY.    ezetimibe (ZETIA) 10 MG tablet TAKE 1 TABLET EVERY DAY (Patient taking differently: Take 10 mg by mouth daily.)    insulin aspart (NOVOLOG FLEXPEN) 100 UNIT/ML FlexPen Inject 5 Units into the skin 3 (three) times daily with meals. Give if eats 50% or more of meal. (Patient taking differently: Inject 14 Units into the skin 3 (three) times daily with meals. Give if eats 50% or more of meal.)    insulin glargine, 2 Unit Dial, (TOUJEO MAX SOLOSTAR) 300 UNIT/ML Solostar Pen Inject 20-25 Units into the skin daily.    lamoTRIgine (LAMICTAL) 100 MG tablet Take 1 tablet (100 mg total) by mouth 2 (two) times daily. 10/03/2021: Pt not sure when she used this med last   levothyroxine (SYNTHROID) 50 MCG tablet TAKE 1 TABLET EVERY DAY FOR 6 DAYS PER WEEK, 1/2 TABLET 1 DAY PER WEEK    LORazepam (ATIVAN) 0.5 MG tablet  Take 1 tablet (0.5 mg total) by mouth 2 (two) times daily.    meclizine (ANTIVERT) 25 MG tablet Take 1 tablet (25 mg total) by mouth 3 (three) times daily as needed for dizziness.    metoprolol tartrate (LOPRESSOR) 50 MG tablet TAKE 1 TABLET TWICE DAILY (NEED MD APPOINTMENT) (Patient taking differently: Take 50 mg by mouth 2 (two) times daily.)    mirabegron ER (MYRBETRIQ) 25 MG TB24 tablet Take 1 tablet (25 mg total) by mouth daily.    Misc. Devices (MATTRESS PAD) MISC FIRM MATTRESS PAD for hospital bed x 1    montelukast (SINGULAIR) 10 MG tablet TAKE 1 TABLET EVERY DAY (Patient taking differently: Take 10 mg by mouth at bedtime.)    Multiple Vitamins-Minerals (MULTIVITAMIN GUMMIES ADULT PO) Take 1 tablet by mouth daily.    nystatin (MYCOSTATIN/NYSTOP) powder APPLY TO AFFECTED AREA 4 TIMES DAILY. (Patient taking differently: Apply 1 Application topically in the morning, at noon, in the evening, and at bedtime.)    Omega-3 Fatty  Acids (FISH OIL PO) Take 1 capsule by mouth daily.    ondansetron (ZOFRAN) 4 MG tablet Take 1 tablet (4 mg total) by mouth every 8 (eight) hours as needed for nausea or vomiting.    oxyCODONE-acetaminophen (PERCOCET) 10-325 MG tablet Take 1 tablet by mouth every 8 (eight) hours as needed for pain.    pregabalin (LYRICA) 75 MG capsule Take 1 capsule (75 mg total) by mouth daily.    RABEprazole (ACIPHEX) 20 MG tablet Take 1 tablet (20 mg total) by mouth 2 (two) times daily.    RESTASIS 0.05 % ophthalmic emulsion Place 2 drops into both eyes daily.    risperiDONE (RISPERDAL) 0.5 MG tablet Take 1 tablet (0.5 mg total) by mouth at bedtime.    sertraline (ZOLOFT) 100 MG tablet Take 1 tablet (100 mg total) by mouth every morning.    STIOLTO RESPIMAT 2.5-2.5 MCG/ACT AERS INHALE 2 PUFFS INTO THE LUNGS DAILY. (Patient taking differently: Inhale 2 puffs into the lungs daily.)    traZODone (DESYREL) 150 MG tablet Take 1 tablet (150 mg total) by mouth at bedtime.    vitamin E 180 MG (400 UNITS) capsule Take 400 Units by mouth daily.    No facility-administered encounter medications on file as of 10/06/2021.    Patient Active Problem List   Diagnosis Date Noted   Subconjunctival hemorrhage of left eye 10/02/2021   Dehydration 09/25/2021   Encounter for support and coordination of transition of care 09/25/2021   Acute renal failure superimposed on stage 3a chronic kidney disease ([place]) 09/23/2021   AKI (acute kidney injury) ([place]) 09/16/2021   CHF (congestive heart failure) ([place]) 09/16/2021   Abnormal EKG 09/16/2021   Urinary frequency 09/01/2021   Anemia of chronic disease 08/10/2021   Generalized pain 08/04/2021   Sciatica, right side 07/19/2021   Breast pain, left 07/10/2021   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[place]) 07/10/2021   Atherosclerosis of aorta ([place]) 07/10/2021   Generalized weakness 05/12/2021   Advanced care planning/counseling  discussion 12/23/2020   Head trauma, initial encounter 12/20/2020   Trigger finger, right 12/12/2020   Confusion 11/24/2020   Blurry vision 11/18/2020   Cervical radiculopathy 07/09/2020   Closed fracture of lateral malleolus of right fibula 02/27/2020   Headache 02/19/2020   Tubular adenoma of colon 02/09/2019   Benign neoplasm of cerebral meninges ([place]) 11/12/2018   Benign meningioma of brain ([place]) 10/31/2018   Osteoarthritis of both hips 07/06/2018  Lipoma of extremity 12/31/2017   Impingement syndrome of left shoulder region 12/27/2017   Weakness generalized    Leg weakness, bilateral 10/28/2017   Lumbar spondylosis with myelopathy 10/12/2017   Numbness of hand 10/10/2017   Chronic neck pain 07/25/2017   Chronic pain syndrome 07/25/2017   At risk for cardiovascular event 58/85/0277   Acute metabolic encephalopathy 41/28/7867   Diabetic polyneuropathy associated with type 2 diabetes mellitus ([place]) 05/19/2016   Tobacco user 04/24/2016   Obesity (BMI 30.0-34.9) 04/24/2016   History of palpitations 08/09/2015   Labile hypertension 08/03/2015   Normal coronary arteries 08/03/2015   Dizziness 07/15/2015   Left-sided low back pain with left-sided sciatica 06/27/2015   Multinodular goiter 05/06/2015   Rectocele, female 04/27/2015   Anal sphincter incontinence 04/27/2015   Pelvic relaxation due to rectocele 03/30/2015   Hyperkalemia 02/22/2015   Pulmonary hypertension ([place]) 02/22/2015   Posterior chest pain 02/21/2015   Episodic cigarette smoking dependence 01/11/2015   Migraine without aura and without status migrainosus, not intractable 07/02/2014   Flatulence 02/18/2014   Microcytic anemia 02/18/2014   Acquired hypothyroidism 08/16/2013   Gastroparesis 04/28/2013   Nicotine dependence 03/09/2013   Seizure disorder ([place]) 01/19/2013   Displacement of cervical intervertebral disc without myelopathy 12/13/2012   Bursitis of shoulder 12/13/2012   UTI (urinary tract infection)  12/01/2012   Cervical disc disorder with radiculopathy of cervical region 10/31/2012   Solitary pulmonary nodule 08/19/2012   Anemia 07/05/2012   Hypersomnia disorder related to a known organic factor 06/11/2012   Pruritus 04/18/2012   Meningioma ([place]) 11/19/2011   Mononeuritis leg 10/25/2011   Carpal tunnel syndrome of right wrist 05/23/2011   Chronic pain of right hand 05/04/2011   Polypharmacy 04/28/2011   Mood disorder ([place]) 04/28/2011   Constipation 04/13/2011   Recurrent falls 12/12/2010   Oropharyngeal dysphagia 07/12/2010   Urinary incontinence 12/16/2009   HEARING LOSS 10/26/2009   DMII (diabetes mellitus, type 2) ([place]) 07/07/2009   Mixed hyperlipidemia 12/11/2008   IBS 12/11/2008   GERD 07/29/2008   MILK PRODUCTS ALLERGY 07/29/2008   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/03/2007   Essential hypertension 06/27/2007   Backache 06/19/2007   Osteoporosis 06/19/2007   Obstructive sleep apnea 06/19/2007   TRIGGER FINGER 04/18/2007   DIVERTICULOSIS, COLON 11/13/2006    Conditions to be addressed/monitored:HTN, COPD, and DMII  Care Plan : RN Care Manager Plan of Care  Updates made by Kassie Mends, RN since 10/06/2021 12:00 AM     Problem: No plan of care established for management of chronic disease state  (HTN, COPD, DM2, High risk for falls)   Priority: High     Long-Range Goal: Development of plan of care for chronic disease management  (HTN, COPD, DM2, High risk for falls)   Start Date: 08/23/2021  Expected End Date: 02/19/2022  Priority: High  Note:   Current Barriers:  Knowledge Deficits related to plan of care for management of HTN, COPD, and DMII  Chronic Disease Management support and education needs related to HTN, COPD, and DMII Patient reports she lives alone, has CAP aide 8 hours daily x 5 days per week, has church members that assist her frequently, pt does not drive, has necessary DME in the home and recently got a new a  walker, pt reports she "slid down in the bathtub, not a fall" and went to hospital for weakness, discharged 10/05/21 (pt reports she has home health RN and PT through [facility]), pt reports she has  shower chair and aide assists as needed with bathing, cooking, cleaning, etc.  Pt reports she checks blood pressure on occasion with "readings usually good", checks CBG QID with fasting reading today 105, random ranges are usually 118-120, follows special diet "sometimes", smokes 2 cigarettes per day and is still working on complete smoking cessation.  Pt reports she does not feel depressed today and sees LCSW Maurice Small for counseling and feels this has helped tremendously.  Patient reports >3 falls in the past year.  Patient states her aide is with her now and home health nurse is at her apartment also, pt reports she has all medications and taking as prescribed.  RNCM Clinical Goal(s):  Patient will verbalize understanding of plan for management of HTN, COPD, and DMII as evidenced by patient report, review of EHR and  through collaboration with RN Care manager, provider, and care team.   Interventions: 1:1 collaboration with primary care provider regarding development and update of comprehensive plan of care as evidenced by provider attestation and co-signature Inter-disciplinary care team collaboration (see longitudinal plan of care) Evaluation of current treatment plan related to  self management and patient's adherence to plan as established by provider   COPD Interventions:  (Status:  New goal. and Goal on track:  Yes.) Long Term Goal Provided patient with basic written and verbal COPD education on self care/management/and exacerbation prevention Advised patient to self assesses COPD action plan zone and make appointment with provider if in the yellow zone for 48 hours without improvement Advised patient to engage in light exercise as tolerated 3-5 days a week to aid in the the management of  COPD Provided education about and advised patient to utilize infection prevention strategies to reduce risk of respiratory infection Discussed the importance of adequate rest and management of fatigue with COPD Reinforced COPD action plan Reinforced safety precautions and importance of asking for assistance as needed and always using walker  Diabetes Interventions:  (Status:  New goal. and Goal on track:  Yes.) Long Term Goal Assessed patient's understanding of A1c goal: <7% Reviewed medications with patient and discussed importance of medication adherence Counseled on importance of regular laboratory monitoring as prescribed Review of patient status, including review of consultants reports, relevant laboratory and other test results, and medications completed Screening for signs and symptoms of depression related to chronic disease state  Reviewed importance of working with home health PT for strengthening Reinforced carbohydrate modified diet  Lab Results  Component Value Date   HGBA1C 5.9 (A) 05/20/2021   Hypertension Interventions:  (Status:  New goal. and Goal on track:  Yes.) Long Term Goal Last practice recorded BP readings:  BP Readings from Last 3 Encounters:  08/16/21 137/62  08/10/21 (!) 164/68  07/19/21 116/62  Most recent eGFR/CrCl:  Lab Results  Component Value Date   EGFR 52 (L) 05/10/2021    No components found for: "CRCL"  Evaluation of current treatment plan related to hypertension self management and patient's adherence to plan as established by provider Counseled on the importance of exercise goals with target of 150 minutes per week Discussed plans with patient for ongoing care management follow up and provided patient with direct contact information for care management team Advised patient, providing education and rationale, to monitor blood pressure daily and record, calling PCP for findings outside established parameters Discussed complications of poorly  controlled blood pressure such as heart disease, stroke, circulatory complications, vision complications, kidney impairment, sexual dysfunction Reinforced importance of following low sodium diet  Patient Goals/Self-Care Activities: Take medications as prescribed   Attend all scheduled provider appointments Call pharmacy for medication refills 3-7 days in advance of running out of medications Attend church or other social activities Perform all self care activities independently  Perform IADL's (shopping, preparing meals, housekeeping, managing finances) independently Call provider office for new concerns or questions  check blood sugar at prescribed times: 4 times daily check feet daily for cuts, sores or redness enter blood sugar readings and medication or insulin into daily log take the blood sugar log to all doctor visits take the blood sugar meter to all doctor visits trim toenails straight across fill half of plate with vegetables limit fast food meals to no more than 1 per week prepare main meal at home 3 to 5 days each week wash and dry feet carefully every day wear comfortable, cotton socks eliminate smoking in my home identify and remove indoor air pollutants develop a rescue plan follow rescue plan if symptoms flare-up eat healthy/prescribed diet: carbohydrate modified, low sodium, heart healthy check blood pressure 3 times per week write blood pressure results in a log or diary take blood pressure log to all doctor appointments take medications for blood pressure exactly as prescribed eat more whole grains, fruits and vegetables, lean meats and healthy fats Follow low sodium diet Adhere to COPD action plan Continue doing exercises prescribed by physical therapy fall prevention strategies: change position slowly, use assistive device such as walker or cane (per provider recommendations) when walking, keep walkways clear, have good lighting in room. It is important to  contact your provider if you have any falls, maintain muscle strength/tone by exercise per provider recommendations.       Plan:Telephone follow up appointment with care management team member scheduled for:  11/17/21  Jacqlyn Larsen [facility], BSN RN Case Manager [facility] [phone]

## 2021-10-06 NOTE — Telephone Encounter (Signed)
Transition Care Management Unsuccessful Follow-up Telephone Call  Date of discharge and from where:  10/05/21 [name]  Attempts:  1st Attempt  Reason for unsuccessful TCM follow-up call:  Left voice message

## 2021-10-06 NOTE — Patient Instructions (Addendum)
Visit Information  Thank you for taking time to visit with me today. Please don't hesitate to contact me if I can be of assistance to you before our next scheduled telephone appointment.  Following are the goals we discussed today:  Take medications as prescribed   Attend all scheduled provider appointments Call pharmacy for medication refills 3-7 days in advance of running out of medications Attend church or other social activities Perform all self care activities independently  Perform IADL's (shopping, preparing meals, housekeeping, managing finances) independently Call provider office for new concerns or questions  check blood sugar at prescribed times: 4 times daily check feet daily for cuts, sores or redness enter blood sugar readings and medication or insulin into daily log take the blood sugar log to all doctor visits take the blood sugar meter to all doctor visits trim toenails straight across fill half of plate with vegetables limit fast food meals to no more than 1 per week prepare main meal at home 3 to 5 days each week wash and dry feet carefully every day wear comfortable, cotton socks eliminate smoking in my home identify and remove indoor air pollutants develop a rescue plan follow rescue plan if symptoms flare-up eat healthy/prescribed diet: carbohydrate modified, low sodium, heart healthy check blood pressure 3 times per week write blood pressure results in a log or diary take blood pressure log to all doctor appointments take medications for blood pressure exactly as prescribed eat more whole grains, fruits and vegetables, lean meats and healthy fats Follow low sodium diet Adhere to COPD action plan Continue doing exercises prescribed by physical therapy fall prevention strategies: change position slowly, use assistive device such as walker or cane (per provider recommendations) when walking, keep walkways clear, have good lighting in room. It is important to  contact your provider if you have any falls, maintain muscle strength/tone by exercise per provider recommendations.  Our next appointment is by telephone on 11/17/21 at 1045 am  Please call the care guide team at [phone] if you need to cancel or reschedule your appointment.   If you are experiencing a Mental Health or [place] or need someone to talk to, please call the Suicide and Crisis Lifeline: 988 call the [place] National Suicide Prevention Lifeline: 786-704-9606 or TTY: (267)166-8069 TTY (501) 306-5417) to talk to a trained counselor call 1-800-273-TALK (toll free, 24 hour hotline) go to [facility] [address] [place] [phone]) call the [place]: [phone] call 911   Patient verbalizes understanding of instructions and care plan provided today and agrees to view in [place]. Active MyChart status and patient understanding of how to access instructions and care plan via MyChart confirmed with patient.     Jacqlyn Larsen [facility], BSN RN Case Manager [facility] [phone]  It is important to avoid accidents which may result in broken bones.  Here are a few ideas on how to make your home safer so you will be less likely to trip or fall.  Use nonskid mats or non slip strips in your shower or tub, on your bathroom floor and around sinks.  If you know that you have spilled water, wipe it up! In the bathroom, it is important to have properly installed grab bars on the walls or on the edge of the tub.  Towel racks are NOT strong enough for you to hold onto or to pull on for support. Stairs and hallways should have enough light.  Add lamps  or night lights if you need ore light. It is good to have handrails on both sides of the stairs if possible.  Always fix broken handrails right away. It is important to see the edges of steps.  Paint the edges of outdoor steps white so you can see them better.   Put colored tape on the edge of inside steps. Throw-rugs are dangerous because they can slide.  Removing the rugs is the best idea, but if they must stay, add adhesive carpet tape to prevent slipping. Do not keep things on stairs or in the halls.  Remove small furniture that blocks the halls as it may cause you to trip.  Keep telephone and electrical cords out of the way where you walk. Always were sturdy, rubber-soled shoes for good support.  Never wear just socks, especially on the stairs.  Socks may cause you to slip or fall.  Do not wear full-length housecoats as you can easily trip on the bottom.  Place the things you use the most on the shelves that are the easiest to reach.  If you use a stepstool, make sure it is in good condition.  If you feel unsteady, DO NOT climb, ask for help. If a health professional advises you to use a cane or walker, do not be ashamed.  These items can keep you from falling and breaking your bones.

## 2021-10-07 ENCOUNTER — Other Ambulatory Visit: Payer: Self-pay | Admitting: Family Medicine

## 2021-10-07 LAB — VITAMIN B1: Vitamin B1 (Thiamine): 127 nmol/L (ref 66.5–200.0)

## 2021-10-10 ENCOUNTER — Other Ambulatory Visit (HOSPITAL_COMMUNITY): Payer: Medicare HMO

## 2021-10-10 DIAGNOSIS — J449 Chronic obstructive pulmonary disease, unspecified: Secondary | ICD-10-CM

## 2021-10-10 DIAGNOSIS — E1143 Type 2 diabetes mellitus with diabetic autonomic (poly)neuropathy: Secondary | ICD-10-CM | POA: Diagnosis not present

## 2021-10-10 DIAGNOSIS — I10 Essential (primary) hypertension: Secondary | ICD-10-CM | POA: Diagnosis not present

## 2021-10-11 ENCOUNTER — Ambulatory Visit (INDEPENDENT_AMBULATORY_CARE_PROVIDER_SITE_OTHER): Payer: Medicare HMO | Admitting: Psychiatry

## 2021-10-11 ENCOUNTER — Inpatient Hospital Stay: Payer: Medicare HMO | Admitting: Internal Medicine

## 2021-10-11 DIAGNOSIS — F5089 Other specified eating disorder: Secondary | ICD-10-CM | POA: Diagnosis not present

## 2021-10-11 DIAGNOSIS — D509 Iron deficiency anemia, unspecified: Secondary | ICD-10-CM | POA: Diagnosis not present

## 2021-10-11 DIAGNOSIS — N1832 Chronic kidney disease, stage 3b: Secondary | ICD-10-CM | POA: Diagnosis not present

## 2021-10-11 DIAGNOSIS — J449 Chronic obstructive pulmonary disease, unspecified: Secondary | ICD-10-CM | POA: Diagnosis not present

## 2021-10-11 DIAGNOSIS — Z79899 Other long term (current) drug therapy: Secondary | ICD-10-CM | POA: Diagnosis not present

## 2021-10-11 DIAGNOSIS — D631 Anemia in chronic kidney disease: Secondary | ICD-10-CM | POA: Diagnosis not present

## 2021-10-11 DIAGNOSIS — I10 Essential (primary) hypertension: Secondary | ICD-10-CM | POA: Diagnosis not present

## 2021-10-11 DIAGNOSIS — M797 Fibromyalgia: Secondary | ICD-10-CM | POA: Diagnosis not present

## 2021-10-11 DIAGNOSIS — F331 Major depressive disorder, recurrent, moderate: Secondary | ICD-10-CM

## 2021-10-11 DIAGNOSIS — R5383 Other fatigue: Secondary | ICD-10-CM | POA: Diagnosis not present

## 2021-10-11 DIAGNOSIS — R0602 Shortness of breath: Secondary | ICD-10-CM | POA: Diagnosis not present

## 2021-10-11 DIAGNOSIS — K219 Gastro-esophageal reflux disease without esophagitis: Secondary | ICD-10-CM | POA: Diagnosis not present

## 2021-10-11 DIAGNOSIS — E785 Hyperlipidemia, unspecified: Secondary | ICD-10-CM | POA: Diagnosis not present

## 2021-10-11 NOTE — Progress Notes (Signed)
Virtual Visit via Telephone Note  I connected with [name] on 10/11/21 at 4:15 PM EDT  by telephone and verified that I am speaking with the correct person using two identifiers.  Location: Patient: Home Provider: [place] office    I discussed the limitations, risks, security and privacy concerns of performing an evaluation and management service by telephone and the availability of in person appointments. I also discussed with the patient that there may be a patient responsible charge related to this service. The patient expressed understanding and agreed to proceed.   I provided 42 minutes of non-face-to-face time during this encounter.   [name], LCSW             THERAPIST PROGRESS NOTE    Session Time: Tuesday  10/11/2021  4:15 PM - 4:57 PM  Participation Level: Active  Behavioral Response: less depressed, talkative, alert   Type of Therapy: Individual Therapy  Treatment Goals addressed: Patient will score less than 10 on the patient health questionnaire  Progress on goals: Progressing  Interventions: CBT and Supportive             Summary: [name] is a 71 y.o. female who presents with  long standing history of recurrent periods  of depression beginning when she was 63 and her favorite uncle died. Patient reports multiple psychiatric hospitlaizations due to depression and suicidal ideations with the last one occuring in 1997. Patient has participated in outpatient psychotherapy and medication management intermittently since age 51.  She currently is seeing psychiatrist Dr. Harrington Challenger . Prior to this, she was seen at [facility]. Patient also has had ECT at [facility]. Symptoms have worsened in recent months due to family stress and have  included depressed mood, anxiety, excessive worry, and tearfulness.            Patient's last contact was by virtual visit via telephone about  3 weeks ago.  Patient reports continued stress regarding health issues  but managing well.  She states trying to stay upbeat in a positive mood.  She is talkative and exhibits a sense of humor today.  She reports another hospitalization due to weakness as well as a fall since last session.  Per patient's report, she was in the hospital for about 5 days.  She expresses some frustration as medical providers discussed possibility of patient being placed in a nursing home for rehab.  Patient fears she may never return home if she is placed in a nursing facility.  She continues to enjoy working with her aide and reports she is very supportive.  Patient also is pleased she has had more contact from her siblings and her sister-in-law.  She maintains daily contact via phone with 3 of her friends.  She hopes that she can resume attending church this Sunday.   Suicidal/Homicidal: Nowithout intent/plan      Therapist Response: Reviewed symptoms, discussed stressors, facilitated expression of thoughts and feelings, validated feelings, praised and reinforced patient's efforts to try to focus on positive statements, praised and reinforced patient's use of her spirituality, reviewed ways to express needs assertively rather than aggressively especially her concerns regarding mobility at church, assisted patient identify support system at church  Diagnosis: Axis I: MDD, Recurrent, moderate    Axis II: Deferred Collaboration of Care: Psychiatrist AEB patient working with psychiatrist Dr. Harrington Challenger  Patient/Guardian was advised Release of Information must be obtained prior to any record release in order to collaborate their care with an  outside provider. Patient/Guardian was advised if they have not already done so to contact the registration department to sign all necessary forms in order for [name] to release information regarding their care.   Consent: Patient/Guardian gives verbal consent for treatment and assignment of benefits for services provided during this visit. Patient/Guardian expressed  understanding and agreed to proceed.    Trystin Terhune E Quincy Boy, LCSW

## 2021-10-11 NOTE — Progress Notes (Unsigned)
[name], [place] [address]   CLINIC:  Medical Oncology/Hematology  PCP:  [name] Helper, MD [address], [address] [place] [address] [phone]   REASON FOR VISIT:  Follow-up for microcytic anemia/iron deficiency anemia   PRIOR THERAPY: Iron tablets   CURRENT THERAPY: Intermittent Feraheme (last on 02/19/2020)   INTERVAL HISTORY:  [name] 71 y.o. female returns for routine follow-up of her microcytic iron deficiency anemia.  She was last seen by [name] Abernethy PA-C on 08/10/2021.   At today's visit, she reports feeling poorly due to chronic pain. *** She has had 3 hospitalizations since her last visit. Hospitalized from 09/15/2021 to 09/17/2021 for generalized weakness and acute kidney injury from dehydration. She was hospitalized from 09/22/2021 through 09/24/2021 for recurrent acute kidney injury and hyperkalemia. She was hospitalized from 10/03/2021 through 10/05/2021 for acute metabolic encephalopathy and generalized weakness with fall at home, and recurrent AKI from dehydration.   *** She reports occasional dark stool, with last episode several months ago.  She denies any bright red blood per rectum or hematochezia.   *** She has pica cravings for ice chips as well as occasional headaches.    *** She denies any restless legs, chest pain, or syncope.    *** She has dyspnea on exertion and lightheadedness.    *** She denies any fever, chills, or unintentional weight loss.  She has  *** % energy and  *** % appetite. She endorses that she is maintaining a stable weight.   REVIEW OF SYSTEMS:  ***  Review of Systems  Constitutional:  Positive for fatigue. Negative for appetite change, chills, diaphoresis, fever and unexpected weight change.  HENT:   Positive for trouble swallowing. Negative for lump/mass and nosebleeds.   Eyes:  Negative for eye problems.  Respiratory:  Positive for shortness of breath (with exertion).  Negative for cough and hemoptysis.   Cardiovascular:  Negative for chest pain, leg swelling and palpitations.  Gastrointestinal:  Positive for constipation. Negative for abdominal pain, blood in stool, diarrhea, nausea and vomiting.  Genitourinary:  Negative for hematuria.   Musculoskeletal:  Positive for arthralgias and back pain.  Skin: Negative.   Neurological:  Positive for headaches, light-headedness and numbness. Negative for dizziness.  Hematological:  Does not bruise/bleed easily.  Psychiatric/Behavioral:  Positive for depression.       PAST MEDICAL/SURGICAL HISTORY:  Past Medical History:  Diagnosis Date   Allergy    Anemia    Anemia in chronic kidney disease (CKD) 08/10/2021   Anxiety    takes Ativan daily   Arthritis    Assistance needed for mobility    Bipolar disorder ([name])    takes Risperdal nightly   Blood transfusion    Brain tumor ([name])    Cancer ([name])    In her gum   Carpal tunnel syndrome of right wrist 05/23/2011   Cervical disc disorder with radiculopathy of cervical region 10/31/2012   Chronic back pain    Chronic idiopathic constipation    Chronic neck and back pain    Colon polyps    COPD (chronic obstructive pulmonary disease) with chronic bronchitis ([facility]) 09/16/2013   Office Spirometry 10/30/2013-submaximal effort based on appearance of loop and curve. Numbers would fit with severe restriction but her physiologic capability may be better than this. FVC 0.91/44%, and 10.74/45%, FEV1/FVC 0.81, FEF 25-75% 1.43/69%     Diabetes mellitus    Type II   Diverticulosis  TCS 9/08 by Dr. Delfin Edis for diarrhea . Bx for micro scopic colitis negative.    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   Metabolic encephalopathy 82/50/0370   Migraines    chronic headaches   Mononeuritis lower limb    Narcolepsy    Osteoporosis    Pancreatitis 2006   due to Depakote with normal EUS    Paralysis (HCC)    Schatzki's ring    non critical / EGD with ED 8/2011with RMR   Seizures ([facility])    takes Lamictal daily.Last seizure 3 yrs ago   Sleep apnea    on CPAP   Small bowel obstruction (HCC)    Stroke ([name])    left sided weakness, speech changes   Tubular adenoma of colon    Past Surgical History:  Procedure Laterality Date   ABDOMINAL HYSTERECTOMY  1978   BACK SURGERY  July 2012   BACTERIAL OVERGROWTH TEST N/A 05/05/2013   Procedure: BACTERIAL OVERGROWTH TEST;  Surgeon: [name] Dolin, MD;  [facility];  Service: Endoscopy;  Laterality: N/A;  7:30   BIOPSY THYROID  2009   BRAIN SURGERY  11/2011   resection of meningioma   BREAST REDUCTION SURGERY  1994   CARDIAC CATHETERIZATION  05/10/2005   normal coronaries, normal LV systolic function and EF (Dr. Jackie Plum)   [place] Left 07/22/04   Dr. Aline Brochure   CATARACT EXTRACTION Bilateral    CHOLECYSTECTOMY  1984   COLONOSCOPY N/A 09/25/2012   [id number] diverticulosis.  colonic polyp-removed : tubular adenoma   CRANIOTOMY  11/23/2011   Procedure: CRANIOTOMY TUMOR EXCISION;  Surgeon: [name] Spangle, MD;  Location: [facility];  Service: Neurosurgery;  Laterality: N/A;  Craniotomy for tumor resection   ESOPHAGOGASTRODUODENOSCOPY  12/29/2010   [name]-Retained food in the esophagus and stomach, small hiatal hernia, status post Maloney dilation of the esophagus   ESOPHAGOGASTRODUODENOSCOPY N/A 09/25/2012   [id number] atonic baggy esophagus status post Maloney dilation 58 F. Hiatal hernia   GIVENS CAPSULE  STUDY N/A 01/15/2013   NORMAL.    IR GENERIC HISTORICAL  03/17/2016   IR RADIOLOGIST EVAL & MGMT 03/17/2016 [facility]   LESION REMOVAL N/A 05/31/2015   Procedure: REMOVAL RIGHT AND LEFT LESIONS OF MANDIBLE;  Surgeon: Diona Browner, DDS;  Location: [facility];  Service: Oral Surgery;  Laterality: N/A;   MALONEY DILATION  12/29/2010   RMR;   NM MYOCAR PERF WALL MOTION  2006   "relavtiely normal" persantine, mild anterior thinning (breast attenuation artifact), no region of scar/ischemia   OVARIAN CYST REMOVAL     RECTOCELE REPAIR N/A 06/29/2015   Procedure: POSTERIOR REPAIR (RECTOCELE);  Surgeon: Jonnie Kind, MD;  Location: [facility];  Service: Gynecology;  Laterality: N/A;   REDUCTION MAMMAPLASTY Bilateral    SPINE SURGERY  09/29/2010   Dr. Rolena Infante   surgical excision of 3 tumors from right thigh and right buttock  and left upper thigh  2010   TOOTH EXTRACTION Bilateral 12/14/2014   Procedure: REMOVAL OF BILATERAL MANDIBULAR EXOSTOSES;  Surgeon: Diona Browner, DDS;  Location: [facility];  Service: Oral Surgery;  Laterality: Bilateral;   TRANSTHORACIC ECHOCARDIOGRAM  2010   EF 60-65%, mild conc LVH, grade 1 diastolic dysfunction; mildly calcified MV annulus with mildly thickened leaflets, mildly calcified MR annulus     SOCIAL HISTORY:  Social History   Socioeconomic History   Marital status: Divorced    Spouse name: Not on file   Number of children: 1   Years of education: 12   Highest education level: High school graduate  Occupational History   Occupation: Disabled  Tobacco Use   Smoking status: Former    Packs/day: 0.25    Years: 30.00    Total pack years: 7.50    Types: Cigarettes    Quit date: 07/05/2021    Years since quitting: 0.2    Passive exposure: Past   Smokeless tobacco: Never   Tobacco comments:    3-4 a day MRC 05/17/2021  Vaping Use   Vaping Use: Never used  Substance and Sexual Activity   Alcohol use: No    Alcohol/week: 0.0 standard drinks of alcohol    Comment:      Drug use: No   Sexual activity: Not Currently  Other Topics Concern   Not on file  Social History Narrative   01/29/18 Lives alone, has 3 aides, Mon- Fri 8 hrs, 2 hrs on Sat-Sun, RN manages her meds   Caffeine use: Drink coffee sometimes    Right handed    Social Determinants of Health   Financial Resource Strain: Low Risk  (07/11/2021)   Overall Financial Resource Strain (CARDIA)    Difficulty of Paying Living Expenses: Not very hard  Food Insecurity: No Food Insecurity (08/23/2021)   Hunger Vital Sign    Worried About Running Out of Food in the Last Year: Never true    Ran Out of Food in the Last Year: Never true  Transportation Needs: No Transportation Needs (08/23/2021)   PRAPARE - Hydrologist (Medical): No    Lack of Transportation (Non-Medical): No  Physical Activity: Inactive (07/11/2021)   Exercise Vital Sign    Days of Exercise per Week: 0 days    Minutes of Exercise per Session: 0 min  Stress: No Stress Concern Present (07/11/2021)   [place]    Feeling of Stress : Only a little  Social Connections: Moderately Isolated (07/11/2021)   Social Connection and Isolation Panel [NHANES]    Frequency of Communication with Friends and Family: Three times a week    Frequency of Social Gatherings with Friends and Family: Twice a week    Attends Religious Services: More than 4 times per year    Active Member of [employer] or Organizations: No    Attends Archivist Meetings: Never    Marital Status: Divorced  Human resources officer Violence: Not At Risk (05/21/2019)   Humiliation, Afraid, Rape, and Kick questionnaire    Fear of Current or Ex-Partner: No    Emotionally Abused: No    Physically Abused: No    Sexually Abused: No    FAMILY HISTORY:  Family History  Problem Relation Age of Onset   Heart attack Mother        HTN   Pneumonia Father    Kidney failure Father    Diabetes Father  Pancreatic cancer Sister    Cancer Sister        breast    Cancer Sister        pancreatic   Diabetes Brother    Hypertension Brother    Diabetes Brother    Alcohol abuse Maternal Uncle    Stroke Maternal Grandmother    Heart attack Maternal Grandfather    Hypertension Son    Sleep apnea Son    Colon cancer Neg Hx    Anesthesia problems Neg Hx    Hypotension Neg Hx    Malignant hyperthermia Neg Hx    Pseudochol deficiency Neg Hx    Breast cancer Neg Hx    Stomach cancer Neg Hx     CURRENT MEDICATIONS:  Outpatient Encounter Medications as of 10/12/2021  Medication Sig Note   ACCU-CHEK GUIDE test strip TEST BLOOD SUGAR FOUR TIMES DAILY  AND AS NEEDED    Accu-Chek Softclix Lancets lancets TEST BLOOD SUGAR THREE TIMES DAILY AS DIRECTED    albuterol (VENTOLIN HFA) 108 (90 Base) MCG/ACT inhaler INHALE 1 TO 2 PUFFS EVERY 6 HOURS AS NEEDED FOR WHEEZING, SHORTNESS OF BREATH (Patient taking differently: Inhale 1-2 puffs into the lungs every 6 (six) hours as needed for wheezing or shortness of breath.)    Alcohol Swabs (DROPSAFE ALCOHOL PREP) 70 % PADS USE TO CLEAN FINGER PRIOR TO TESTING FOR BLOOD SUGAR  AS DIRECTED    alendronate (FOSAMAX) 70 MG tablet TAKE 1 TABLET (70 MG TOTAL) BY MOUTH EVERY 7 (SEVEN) DAYS. TAKE WITH A FULL GLASS OF WATER ON AN EMPTY STOMACH. 09/23/2021: Sunday    amLODipine (NORVASC) 10 MG tablet TAKE 1 TABLET EVERY DAY (Patient taking differently: Take 10 mg by mouth daily.)    ascorbic acid (VITAMIN C) 500 MG tablet Take 500 mg by mouth daily.    aspirin EC 81 MG tablet Take 1 tablet (81 mg total) by mouth daily with breakfast.    Blood Glucose Calibration (ACCU-CHEK GUIDE CONTROL) LIQD USE AS DIRECTED    blood glucose meter kit and supplies Dispense based on patient and insurance preference. Use up to four times daily as directed. (FOR ICD-10 E10.9, E11.9).    buPROPion (WELLBUTRIN XL) 150 MG 24 hr tablet Take 1 tablet (150 mg total) by mouth every morning.     Calcium Carb-Cholecalciferol (CALTRATE 600+D3 SOFT) 600-20 MG-MCG CHEW Chew 1 tablet by mouth daily at 12 noon.    cetirizine (ZYRTEC) 10 MG tablet Take 1 tablet (10 mg total) by mouth daily.    Cholecalciferol (D3 PO) Take 1 tablet by mouth daily. 09/16/2021: Pt does not know how many units   Clobetasol Prop Emollient Base (CLOBETASOL PROPIONATE E) 0.05 % emollient cream Apply to affected area qd (Patient taking differently: Apply 1 Application topically daily.)    Continuous Blood Gluc Sensor (FREESTYLE LIBRE 14 DAY SENSOR) MISC 1 each by Does not apply route every 14 (fourteen) days. Change every 2 weeks    diclofenac Sodium (VOLTAREN) 1 % GEL APPLY 2 GRAMS TOPICALLY FOUR TIMES DAILY. (Patient taking differently: Apply 2 g topically 4 (four) times daily.)    Docusate Sodium (DSS) 100 MG CAPS Take 1 tablet by mouth in the morning and at bedtime.    DROPLET PEN NEEDLES 31G X 8 MM MISC USE FOR INJECTING INSULIN 4 TIMES DAILY.    ezetimibe (ZETIA) 10 MG tablet TAKE 1 TABLET EVERY DAY (Patient taking differently: Take 10 mg by mouth daily.)    insulin aspart (NOVOLOG FLEXPEN) 100  UNIT/ML FlexPen Inject 5 Units into the skin 3 (three) times daily with meals. Give if eats 50% or more of meal. (Patient taking differently: Inject 14 Units into the skin 3 (three) times daily with meals. Give if eats 50% or more of meal.)    insulin glargine, 2 Unit Dial, (TOUJEO MAX SOLOSTAR) 300 UNIT/ML Solostar Pen Inject 20-25 Units into the skin daily.    lamoTRIgine (LAMICTAL) 100 MG tablet Take 1 tablet (100 mg total) by mouth 2 (two) times daily. 10/03/2021: Pt not sure when she used this med last   levothyroxine (SYNTHROID) 50 MCG tablet TAKE 1 TABLET EVERY DAY FOR 6 DAYS PER WEEK, 1/2 TABLET 1 DAY PER WEEK    LORazepam (ATIVAN) 0.5 MG tablet Take 1 tablet (0.5 mg total) by mouth 2 (two) times daily.    losartan (COZAAR) 50 MG tablet TAKE 1 TABLET EVERY DAY    meclizine (ANTIVERT) 25 MG tablet Take 1 tablet (25 mg  total) by mouth 3 (three) times daily as needed for dizziness.    metoprolol tartrate (LOPRESSOR) 50 MG tablet TAKE 1 TABLET TWICE DAILY (NEED MD APPOINTMENT) (Patient taking differently: Take 50 mg by mouth 2 (two) times daily.)    Misc. Devices (MATTRESS PAD) MISC FIRM MATTRESS PAD for hospital bed x 1    montelukast (SINGULAIR) 10 MG tablet TAKE 1 TABLET EVERY DAY (Patient taking differently: Take 10 mg by mouth at bedtime.)    Multiple Vitamins-Minerals (MULTIVITAMIN GUMMIES ADULT PO) Take 1 tablet by mouth daily.    MYRBETRIQ 25 MG TB24 tablet TAKE 1 TABLET EVERY DAY    nystatin (MYCOSTATIN/NYSTOP) powder APPLY TO AFFECTED AREA 4 TIMES DAILY. (Patient taking differently: Apply 1 Application topically in the morning, at noon, in the evening, and at bedtime.)    Omega-3 Fatty Acids (FISH OIL PO) Take 1 capsule by mouth daily.    ondansetron (ZOFRAN) 4 MG tablet Take 1 tablet (4 mg total) by mouth every 8 (eight) hours as needed for nausea or vomiting.    oxyCODONE-acetaminophen (PERCOCET) 10-325 MG tablet Take 1 tablet by mouth every 8 (eight) hours as needed for pain.    pregabalin (LYRICA) 75 MG capsule Take 1 capsule (75 mg total) by mouth daily.    RABEprazole (ACIPHEX) 20 MG tablet Take 1 tablet (20 mg total) by mouth 2 (two) times daily.    RESTASIS 0.05 % ophthalmic emulsion Place 2 drops into both eyes daily.    risperiDONE (RISPERDAL) 0.5 MG tablet Take 1 tablet (0.5 mg total) by mouth at bedtime.    sertraline (ZOLOFT) 100 MG tablet Take 1 tablet (100 mg total) by mouth every morning.    STIOLTO RESPIMAT 2.5-2.5 MCG/ACT AERS INHALE 2 PUFFS INTO THE LUNGS DAILY. (Patient taking differently: Inhale 2 puffs into the lungs daily.)    traZODone (DESYREL) 150 MG tablet Take 1 tablet (150 mg total) by mouth at bedtime.    vitamin E 180 MG (400 UNITS) capsule Take 400 Units by mouth daily.    No facility-administered encounter medications on file as of 10/12/2021.    ALLERGIES:  Allergies   Allergen Reactions   Iron Nausea And Vomiting    And itching And itching   Milk (Cow) Rash    Doesn't agree with stomach.  Doesn't agree with stomach.  Doesn't agree with stomach.    Penicillins Hives    Has patient had a PCN reaction causing immediate rash, facial/tongue/throat swelling, SOB or lightheadedness with hypotension: Yes Has patient had a  PCN reaction causing severe rash involving mucus membranes or skin necrosis: No Has patient had a PCN reaction that required hospitalization No Has patient had a PCN reaction occurring within the last 10 years: No If all of the above answers are "NO", then may proceed with Cephalosporin use.  Other reaction(s): Other (see comments) Has patient had a PCN reaction causing immediate rash, facial/tongue/throat swelling, SOB or lightheadedness with hypotension: Yes Has patient had a PCN reaction causing severe rash involving mucus membranes or skin necrosis: No Has patient had a PCN reaction that required hospitalization No Has patient had a PCN reaction occurring within the last 10 years: No If all of the above answers are "NO", then may proceed with Cephalosporin use. Has patient had a PCN reaction causing immediate rash, facial/tongue/throat swelling, SOB or lightheadedness with hypotension: Yes Has patient had a PCN reaction causing severe rash involving mucus membranes or skin necrosis: No Has patient had a PCN reaction that required hospitalization No Has patient had a PCN reaction occurring within the last 10 years: No If all of the above answers are "NO", then may proceed with Cephalosporin use. Has patient had a PCN reaction causing immediate rash, facial/tongue/throat swelling, SOB or lightheadedness with hypotension: Yes Has patient had a PCN reaction causing severe rash involving mucus membranes or skin necrosis: No Has patient had a PCN reaction that required hospitalization No Has patient had a PCN reaction occurring within the  last 10 years: No If all of the above answers are "NO", then may proceed with Cephalosporin use. Has patient had a PCN reaction causing immediate rash, facial/tongue/throat swelling, SOB or lightheadedness with hypotension: Yes Has patient had a PCN reaction causing sever... (TRUNCATED)   Phenazopyridine Hives   Phenazopyridine Hcl Hives   Cephalexin Hives   Flonase [Fluticasone]     "It gave me ulcers in my nose"   Milk-Related Compounds Other (See Comments)    Doesn't agree with stomach.    Phenazopyridine Hcl Hives           PHYSICAL EXAM:  ***  ECOG PERFORMANCE STATUS: 2 - Symptomatic, <50% confined to bed  There were no vitals filed for this visit. There were no vitals filed for this visit. Physical Exam Constitutional:      Appearance: Normal appearance. She is obese.  HENT:     Head: Normocephalic and atraumatic.     Mouth/Throat:     Mouth: Mucous membranes are moist.  Eyes:     Extraocular Movements: Extraocular movements intact.     Pupils: Pupils are equal, round, and reactive to light.  Cardiovascular:     Rate and Rhythm: Normal rate and regular rhythm.     Pulses: Normal pulses.     Heart sounds: Normal heart sounds.  Pulmonary:     Effort: Pulmonary effort is normal.     Breath sounds: Normal breath sounds.     Comments: Decreased breath sounds, auscultation limited by body habitus Abdominal:     General: Bowel sounds are normal.     Palpations: Abdomen is soft.     Tenderness: There is no abdominal tenderness.  Musculoskeletal:        General: No swelling.     Right lower leg: No edema.     Left lower leg: No edema.  Lymphadenopathy:     Cervical: No cervical adenopathy.  Skin:    General: Skin is warm and dry.  Neurological:     General: No focal deficit present.  Mental Status: She is alert and oriented to person, place, and time.     Motor: Weakness (Chronic right hand hemiparesis) present.  Psychiatric:        Mood and Affect: Mood  normal.        Behavior: Behavior normal.      LABORATORY DATA:  I have reviewed the labs as listed.  CBC    Component Value Date/Time   WBC 4.8 10/05/2021 0507   RBC 3.56 (L) 10/05/2021 0507   HGB 8.2 (L) 10/05/2021 0507   HGB 10.8 (L) 11/18/2020 1057   HCT 28.7 (L) 10/05/2021 0507   HCT 37.9 11/18/2020 1057   PLT 216 10/05/2021 0507   PLT 253 11/18/2020 1057   MCV 80.6 10/05/2021 0507   MCV 77 (L) 11/18/2020 1057   MCH 23.0 (L) 10/05/2021 0507   MCHC 28.6 (L) 10/05/2021 0507   RDW 15.3 10/05/2021 0507   RDW 13.1 11/18/2020 1057   LYMPHSABS 2.1 10/04/2021 0449   LYMPHSABS 2.1 11/18/2020 1057   MONOABS 0.7 10/04/2021 0449   EOSABS 0.2 10/04/2021 0449   EOSABS 0.2 11/18/2020 1057   BASOSABS 0.0 10/04/2021 0449   BASOSABS 0.0 11/18/2020 1057      Latest Ref Rng & Units 10/05/2021    5:07 AM 10/04/2021    4:49 AM 10/03/2021    1:54 PM  CMP  Glucose 70 - 99 mg/dL 100  117  102   BUN 8 - 23 mg/dL 21  39  37   Creatinine 0.44 - 1.00 mg/dL 1.01  1.79  1.74   Sodium 135 - 145 mmol/L 142  143  141   Potassium 3.5 - 5.1 mmol/L 3.8  3.7  3.6   Chloride 98 - 111 mmol/L 116  116  113   CO2 22 - 32 mmol/L _0 Calcium 8.9 - 10.3 mg/dL 8.2  8.4  9.1   Total Protein 6.5 - 8.1 g/dL  5.7    Total Bilirubin 0.3 - 1.2 mg/dL  0.4    Alkaline Phos 38 - 126 U/L  62    AST 15 - 41 U/L  16    ALT 0 - 44 U/L  20      DIAGNOSTIC IMAGING:  I have independently reviewed the relevant imaging and discussed with the patient.  ASSESSMENT & PLAN: 1.  Microcytic anemia - This is a combination anemia from functional iron deficiency (in the setting of chronic disease including COPD, type 2 diabetes, fibromyalgia, hypertension, and multiple other comorbidities listed in history) and chronic kidney disease stage III. - SPEP checked in 2019 was normal. - Colonoscopy (11/04/2020): Multiple diverticula, otherwise normal - EGD (11/04/2020): Normal esophagus, gastritis, gastric lipoma - She was  previously placed on iron pills, but could not tolerate them due to constipation. - Most recent IV iron Venofer 300 mg x 2 from 08/16/2021 through 08/29/2021 (requires PREMEDICATION with steroids and Zofran due to episode of mild swelling, itching, and nausea after Feraheme in October 2020) - She denies any bleeding per rectum or melena, but does have occasional dark stools *** - Symptoms *** - Most recent labs (10/12/2021): ***.  Normal folate, copper, vitamin B12 when checked in April 2023. - PLAN: ***  Recommend IV Venofer 300 mg x 2 doses due to functional anemia (saturation <20%) in the setting of anemia of CKD.  (**PREMEDICATE with steroids and Zofran due to history of mouth swelling, itching, and nausea after Feraheme in October 2020) *** - ***  STOOL CARDS x3??  *** - Repeat labs and RTC in 8 weeks (CBC, CMP, iron panel) ***  - If she continues to have persistent Hgb < 10.0 in the setting of CKD stage III, we will start her on Retacrit 10,000 units every 2 weeks at her next visit (will require prior authorization) ***  - She follows with Dr. Theador Hawthorne for her CKD stage IIIa   PLAN SUMMARY & DISPOSITION:  ***   All questions were answered. The patient knows to call the clinic with any problems, questions or concerns.  Medical decision making: Moderate ***   Time spent on visit: I spent  ***  minutes counseling the patient face to face. The total time spent in the appointment was  ***  minutes and more than 50% was on counseling.   Harriett Rush, PA-C   ***

## 2021-10-12 ENCOUNTER — Inpatient Hospital Stay (HOSPITAL_BASED_OUTPATIENT_CLINIC_OR_DEPARTMENT_OTHER): Payer: Medicare HMO | Admitting: Physician Assistant

## 2021-10-12 ENCOUNTER — Other Ambulatory Visit: Payer: Self-pay

## 2021-10-12 ENCOUNTER — Telehealth (INDEPENDENT_AMBULATORY_CARE_PROVIDER_SITE_OTHER): Payer: Medicare HMO | Admitting: Psychiatry

## 2021-10-12 ENCOUNTER — Inpatient Hospital Stay: Payer: Medicare HMO | Attending: Hematology

## 2021-10-12 ENCOUNTER — Encounter (HOSPITAL_COMMUNITY): Payer: Self-pay | Admitting: Psychiatry

## 2021-10-12 ENCOUNTER — Encounter: Payer: Self-pay | Admitting: Physician Assistant

## 2021-10-12 VITALS — BP 116/94 | HR 64 | Temp 98.3°F | Resp 18 | Ht 59.0 in | Wt 166.3 lb

## 2021-10-12 DIAGNOSIS — D509 Iron deficiency anemia, unspecified: Secondary | ICD-10-CM

## 2021-10-12 DIAGNOSIS — G4730 Sleep apnea, unspecified: Secondary | ICD-10-CM | POA: Diagnosis not present

## 2021-10-12 DIAGNOSIS — I10 Essential (primary) hypertension: Secondary | ICD-10-CM | POA: Insufficient documentation

## 2021-10-12 DIAGNOSIS — N1832 Chronic kidney disease, stage 3b: Secondary | ICD-10-CM

## 2021-10-12 DIAGNOSIS — M810 Age-related osteoporosis without current pathological fracture: Secondary | ICD-10-CM | POA: Diagnosis not present

## 2021-10-12 DIAGNOSIS — K449 Diaphragmatic hernia without obstruction or gangrene: Secondary | ICD-10-CM | POA: Diagnosis not present

## 2021-10-12 DIAGNOSIS — D631 Anemia in chronic kidney disease: Secondary | ICD-10-CM

## 2021-10-12 DIAGNOSIS — F5089 Other specified eating disorder: Secondary | ICD-10-CM | POA: Insufficient documentation

## 2021-10-12 DIAGNOSIS — Z79899 Other long term (current) drug therapy: Secondary | ICD-10-CM | POA: Insufficient documentation

## 2021-10-12 DIAGNOSIS — J449 Chronic obstructive pulmonary disease, unspecified: Secondary | ICD-10-CM | POA: Diagnosis not present

## 2021-10-12 DIAGNOSIS — E039 Hypothyroidism, unspecified: Secondary | ICD-10-CM | POA: Insufficient documentation

## 2021-10-12 DIAGNOSIS — E785 Hyperlipidemia, unspecified: Secondary | ICD-10-CM | POA: Insufficient documentation

## 2021-10-12 DIAGNOSIS — K219 Gastro-esophageal reflux disease without esophagitis: Secondary | ICD-10-CM | POA: Diagnosis not present

## 2021-10-12 DIAGNOSIS — M797 Fibromyalgia: Secondary | ICD-10-CM | POA: Insufficient documentation

## 2021-10-12 DIAGNOSIS — R5383 Other fatigue: Secondary | ICD-10-CM | POA: Insufficient documentation

## 2021-10-12 DIAGNOSIS — R0602 Shortness of breath: Secondary | ICD-10-CM | POA: Diagnosis not present

## 2021-10-12 DIAGNOSIS — Z800 Family history of malignant neoplasm of digestive organs: Secondary | ICD-10-CM | POA: Insufficient documentation

## 2021-10-12 DIAGNOSIS — F331 Major depressive disorder, recurrent, moderate: Secondary | ICD-10-CM

## 2021-10-12 LAB — COMPREHENSIVE METABOLIC PANEL
ALT: 28 U/L (ref 0–44)
AST: 24 U/L (ref 15–41)
Albumin: 3.8 g/dL (ref 3.5–5.0)
Alkaline Phosphatase: 54 U/L (ref 38–126)
Anion gap: 7 (ref 5–15)
BUN: 23 mg/dL (ref 8–23)
CO2: 24 mmol/L (ref 22–32)
Calcium: 9.3 mg/dL (ref 8.9–10.3)
Chloride: 112 mmol/L — ABNORMAL HIGH (ref 98–111)
Creatinine, Ser: 1.11 mg/dL — ABNORMAL HIGH (ref 0.44–1.00)
GFR, Estimated: 53 mL/min — ABNORMAL LOW (ref >60)
Glucose, Bld: 81 mg/dL (ref 70–99)
Potassium: 3.6 mmol/L (ref 3.5–5.1)
Sodium: 143 mmol/L (ref 135–145)
Total Bilirubin: 0.4 mg/dL (ref 0.3–1.2)
Total Protein: 7 g/dL (ref 6.5–8.1)

## 2021-10-12 LAB — CBC WITH DIFFERENTIAL/PLATELET
Abs Immature Granulocytes: 0.07 10*3/uL (ref 0.00–0.07)
Basophils Absolute: 0 10*3/uL (ref 0.0–0.1)
Basophils Relative: 0 %
Eosinophils Absolute: 0.3 10*3/uL (ref 0.0–0.5)
Eosinophils Relative: 4 %
HCT: 31.8 % — ABNORMAL LOW (ref 36.0–46.0)
Hemoglobin: 8.9 g/dL — ABNORMAL LOW (ref 12.0–15.0)
Immature Granulocytes: 1 %
Lymphocytes Relative: 25 %
Lymphs Abs: 1.6 10*3/uL (ref 0.7–4.0)
MCH: 22.6 pg — ABNORMAL LOW (ref 26.0–34.0)
MCHC: 28 g/dL — ABNORMAL LOW (ref 30.0–36.0)
MCV: 80.9 fL (ref 80.0–100.0)
Monocytes Absolute: 0.7 10*3/uL (ref 0.1–1.0)
Monocytes Relative: 11 %
Neutro Abs: 3.7 10*3/uL (ref 1.7–7.7)
Neutrophils Relative %: 59 %
Platelets: 233 10*3/uL (ref 150–400)
RBC: 3.93 MIL/uL (ref 3.87–5.11)
RDW: 15.8 % — ABNORMAL HIGH (ref 11.5–15.5)
WBC: 6.3 10*3/uL (ref 4.0–10.5)
nRBC: 0 % (ref 0.0–0.2)

## 2021-10-12 LAB — IRON AND TIBC
Iron: 67 ug/dL (ref 28–170)
Saturation Ratios: 20 % (ref 10.4–31.8)
TIBC: 332 ug/dL (ref 250–450)
UIBC: 265 ug/dL

## 2021-10-12 LAB — FERRITIN: Ferritin: 397 ng/mL — ABNORMAL HIGH (ref 11–307)

## 2021-10-12 MED ORDER — BUPROPION HCL ER (XL) 150 MG PO TB24: 150.0000 mg | ORAL_TABLET | ORAL | 2 refills | Status: DC

## 2021-10-12 MED ORDER — SERTRALINE HCL 100 MG PO TABS: 100.0000 mg | ORAL_TABLET | ORAL | 2 refills | Status: DC

## 2021-10-12 MED ORDER — LAMOTRIGINE 100 MG PO TABS: 100.0000 mg | ORAL_TABLET | Freq: Two times a day (BID) | ORAL | 2 refills | Status: DC

## 2021-10-12 MED ORDER — TRAZODONE HCL 150 MG PO TABS: 150.0000 mg | ORAL_TABLET | Freq: Every day | ORAL | 2 refills | Status: DC

## 2021-10-12 MED ORDER — RISPERIDONE 0.5 MG PO TABS: 0.5000 mg | ORAL_TABLET | Freq: Every day | ORAL | 2 refills | Status: DC

## 2021-10-12 MED ORDER — LORAZEPAM 0.5 MG PO TABS: 0.5000 mg | ORAL_TABLET | Freq: Two times a day (BID) | ORAL | 2 refills | Status: DC

## 2021-10-12 NOTE — Patient Instructions (Addendum)
[place] [facility] Discharge Instructions  You were seen today by Tarri Abernethy PA-C for your anemia.  Even after your IV iron, you have still had significant anemia (low blood counts).  This is most likely related to your underlying chronic kidney disease.  We will start you on Retacrit shots next week.  You will need to receive these shots once every 2 weeks to help improve your blood count.  We will also check stool cards to see if you have any blood loss in your bowel movements.  LABS: We will check blood counts every 2 weeks on the same day as your Retacrit injection.  OTHER TESTS: Check stool cards x3 (bring completed cards back to the front desk of the fourth floor [place] when you come for your Retacrit shot next week)  MEDICATIONS: Retacrit injection every 2 weeks, first dose next week  FOLLOW-UP APPOINTMENT: Office visit in 6 weeks  Epoetin Alfa injection What is this medication? EPOETIN ALFA (e POE e tin AL fa) helps your body make more red blood cells. This medicine is used to treat anemia caused by chronic kidney disease, cancer chemotherapy, or HIV-therapy. It may also be used before surgery if you have anemia. This medicine may be used for other purposes; ask your health care provider or pharmacist if you have questions. COMMON BRAND NAME(S): Epogen, Procrit, Retacrit What should I tell my care team before I take this medication? They need to know if you have any of these conditions: cancer heart disease high blood pressure history of blood clots history of stroke low levels of folate, iron, or vitamin B12 in the blood seizures an unusual or allergic reaction to erythropoietin, albumin, benzyl alcohol, hamster proteins, other medicines, foods, dyes, or preservatives pregnant or trying to get pregnant breast-feeding How should I use this medication? This medicine is for injection into a vein or under the skin. It is usually given  by a health care professional in a hospital or clinic setting. If you get this medicine at home, you will be taught how to prepare and give this medicine. Use exactly as directed. Take your medicine at regular intervals. Do not take your medicine more often than directed. It is important that you put your used needles and syringes in a special sharps container. Do not put them in a trash can. If you do not have a sharps container, call your pharmacist or healthcare provider to get one. A special MedGuide will be given to you by the pharmacist with each prescription and refill. Be sure to read this information carefully each time. Talk to your pediatrician regarding the use of this medicine in children. While this drug may be prescribed for selected conditions, precautions do apply. Overdosage: If you think you have taken too much of this medicine contact a poison control center or emergency room at once. NOTE: This medicine is only for you. Do not share this medicine with others. What if I miss a dose? If you miss a dose, take it as soon as you can. If it is almost time for your next dose, take only that dose. Do not take double or extra doses. What may interact with this medication? Interactions have not been studied. This list may not describe all possible interactions. Give your health care provider a list of all the medicines, herbs, non-prescription drugs, or dietary supplements you use. Also tell them if you smoke, drink alcohol, or use illegal drugs. Some items may  interact with your medicine. What should I watch for while using this medication? Your condition will be monitored carefully while you are receiving this medicine. You may need blood work done while you are taking this medicine. This medicine may cause a decrease in vitamin B6. You should make sure that you get enough vitamin B6 while you are taking this medicine. Discuss the foods you eat and the vitamins you take with your health  care professional. What side effects may I notice from receiving this medication? Side effects that you should report to your doctor or health care professional as soon as possible: allergic reactions like skin rash, itching or hives, swelling of the face, lips, or tongue seizures signs and symptoms of a blood clot such as breathing problems; changes in vision; chest pain; severe, sudden headache; pain, swelling, warmth in the leg; trouble speaking; sudden numbness or weakness of the face, arm or leg signs and symptoms of a stroke like changes in vision; confusion; trouble speaking or understanding; severe headaches; sudden numbness or weakness of the face, arm or leg; trouble walking; dizziness; loss of balance or coordination Side effects that usually do not require medical attention (report to your doctor or health care professional if they continue or are bothersome): chills cough dizziness fever headaches joint pain muscle cramps muscle pain nausea, vomiting pain, redness, or irritation at site where injected This list may not describe all possible side effects. Call your doctor for medical advice about side effects. You may report side effects to FDA at 1-800-FDA-1088. Where should I keep my medication? Keep out of the reach of children. Store in a refrigerator between 2 and 8 degrees C (36 and 46 degrees F). Do not freeze or shake. Throw away any unused portion if using a single-dose vial. Multi-dose vials can be kept in the refrigerator for up to 21 days after the initial dose. Throw away unused medicine. NOTE: This sheet is a summary. It may not cover all possible information. If you have questions about this medicine, talk to your doctor, pharmacist, or health care provider.  2023 Elsevier/Gold Standard (2016-10-31 00:00:00)    Thank you for choosing [place] at [facility] to provide your oncology and hematology care.  To afford each patient quality  time with our provider, please arrive at least 15 minutes before your scheduled appointment time.   If you have a lab appointment with the [facility] please come in thru the Main Entrance and check in at the main information desk.  You need to re-schedule your appointment should you arrive 10 or more minutes late.  We strive to give you quality time with our providers, and arriving late affects you and other patients whose appointments are after yours.  Also, if you no show three or more times for appointments you may be dismissed from the clinic at the providers discretion.     Again, thank you for choosing [facility].  Our hope is that these requests will decrease the amount of time that you wait before being seen by our physicians.       _____________________________________________________________  Should you have questions after your visit to [facility], please contact our office at [phone] and follow the prompts.  Our office hours are 8:00 a.m. and 4:30 p.m. Monday - Friday.  Please note that voicemails left after 4:00 p.m. may not be returned until the following business day.  We are closed weekends and major  holidays.  You do have access to a nurse 24-7, just call the main number to the clinic [phone] and do not press any options, hold on the line and a nurse will answer the phone.    For prescription refill requests, have your pharmacy contact our office and allow 72 hours.

## 2021-10-12 NOTE — Patient Outreach (Signed)
[facility]) Care Management  10/12/2021  [name] 12-30-1950 [id number]   [facility] [facility] [ACO] Patient: [name]  Primary Care Provider:  [name], MD, [facility]  is an embedded provider with a Chronic Care Management team and program, and is listed for the transition of care follow up and appointments.  Return call received from patient 2 HIPAA identifiers were given.  Explained to patient that this writer can request a follow up call from her Embedded RNCM.  She states, "me and that nurse seems to be playing phone tag."   Plan:  Notification sent to the [facility] and made aware of TOC needs for per patient call.  Please contact for further questions,  Natividad Brood, RN BSN [facility] Liaison  [phone] business mobile phone Toll free office [phone]  Fax number: [phone] [email or web address]

## 2021-10-12 NOTE — Progress Notes (Signed)
Virtual Visit via Telephone Note  I connected with [name] on 10/12/21 at  4:00 PM EDT by telephone and verified that I am speaking with the correct person using two identifiers.  Location: Patient: home Provider: office   I discussed the limitations, risks, security and privacy concerns of performing an evaluation and management service by telephone and the availability of in person appointments. I also discussed with the patient that there may be a patient responsible charge related to this service. The patient expressed understanding and agreed to proceed.       I discussed the assessment and treatment plan with the patient. The patient was provided an opportunity to ask questions and all were answered. The patient agreed with the plan and demonstrated an understanding of the instructions.   The patient was advised to call back or seek an in-person evaluation if the symptoms worsen or if the condition fails to improve as anticipated.  I provided 15 minutes of non-face-to-face time during this encounter.   [name] Spiller, MD  [facility] MD/PA/NP OP Progress Note  10/12/2021 4:31 PM [name]  MRN:  [id number]  Chief Complaint:  Chief Complaint  Patient presents with   Anxiety   Depression   Follow-up   HPI: This patient is a 71 year old divorced white female who lives alone in [place].  She had worked in a Research officer, trade union but is now on Immunologist for follow-up after 3 months.  Unfortunately she was hospitalized about 3 times in the last month.  Last time was when her aide found her in the bathtub and was unable to arouse her.  She was dehydrated.  Her aide comes 5 days a week but she is alone on the weekends which probably is not a good thing.  She states that she is trying to keep well-hydrated and eat enough food but it seems as if she is not able to keep this going.  She states that she goes to church every Sunday on the church [place] and family check on her  periodically.  While in the hospital most of her psych meds were held and she states she felt very anxious nervous and unable to sleep so she does not want anything changed.  She is coherent and making good sense today and seems alert.  She does have low iron and will be getting shots to help with this.  The iron infusions did not help. Visit Diagnosis:    ICD-10-CM   1. Major depressive disorder, recurrent episode, moderate (HCC)  F33.1       Past Psychiatric History: Numerous hospitalizations for depression years ago including ECT treatment  Past Medical History:  Past Medical History:  Diagnosis Date   Allergy    Anemia    Anemia in chronic kidney disease (CKD) 08/10/2021   Anemia in chronic kidney disease (CKD) 10/12/2021   Anxiety    takes Ativan daily   Arthritis    Assistance needed for mobility    Bipolar disorder ([place])    takes Risperdal nightly   Blood transfusion    Brain tumor ([place])    Cancer ([place])    In her gum   Carpal tunnel syndrome of right wrist 05/23/2011   Cervical disc disorder with radiculopathy of cervical region 10/31/2012   Chronic back pain    Chronic idiopathic constipation    Chronic neck and back pain    Colon polyps    COPD (chronic obstructive pulmonary disease) with chronic bronchitis ([place])  09/16/2013   Office Spirometry 10/30/2013-submaximal effort based on appearance of loop and curve. Numbers would fit with severe restriction but her physiologic capability may be better than this. FVC 0.91/44%, and 10.74/45%, FEV1/FVC 0.81, FEF 25-75% 1.43/69%     Diabetes mellitus    Type II   Diverticulosis    TCS 9/08 by Dr. Delfin Edis for diarrhea . Bx for micro scopic colitis negative.    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   Metabolic encephalopathy 47/42/5956   Migraines    chronic headaches   Mononeuritis lower limb    Narcolepsy    Osteoporosis    Pancreatitis 2006   due to Depakote with normal EUS    Paralysis (HCC)    Schatzki's ring    non critical / EGD with ED 8/2011with RMR   Seizures ([place])    takes Lamictal daily.Last seizure 3 yrs ago   Sleep apnea    on CPAP   Small bowel obstruction (HCC)    Stroke ([place])    left sided weakness, speech changes   Tubular adenoma of colon     Past Surgical History:  Procedure Laterality Date   ABDOMINAL HYSTERECTOMY  1978   BACK SURGERY  July 2012   BACTERIAL OVERGROWTH TEST N/A 05/05/2013   Procedure: BACTERIAL OVERGROWTH TEST;  Surgeon: Daneil Dolin, MD;  [facility];  Service: Endoscopy;  Laterality: N/A;  7:30   BIOPSY THYROID  2009   BRAIN SURGERY  11/2011   resection of meningioma   BREAST REDUCTION SURGERY  1994   CARDIAC CATHETERIZATION  05/10/2005   normal coronaries, normal LV systolic function and EF (Dr. Jackie Plum)   [place] Left 07/22/04   Dr. Aline Brochure   CATARACT EXTRACTION Bilateral    CHOLECYSTECTOMY  1984   COLONOSCOPY N/A 09/25/2012   LOV:FIEPPIR diverticulosis.  colonic polyp-removed : tubular adenoma   CRANIOTOMY  11/23/2011   Procedure: CRANIOTOMY TUMOR EXCISION;  Surgeon: Hosie Spangle, MD;  Location: [place] OR[facility];  Service: Neurosurgery;  Laterality: N/A;  Craniotomy for tumor resection   ESOPHAGOGASTRODUODENOSCOPY  12/29/2010   [place]-Retained food in the esophagus and stomach, small hiatal hernia, status post Maloney dilation of the esophagus   ESOPHAGOGASTRODUODENOSCOPY N/A 09/25/2012   JJO:ACZYSAYT atonic baggy esophagus status post Maloney  dilation 80 F. Hiatal hernia   GIVENS CAPSULE STUDY N/A 01/15/2013   NORMAL.    IR GENERIC HISTORICAL  03/17/2016   IR RADIOLOGIST EVAL & MGMT 03/17/2016 [facility]-INTERV RAD   LESION REMOVAL N/A 05/31/2015   Procedure: REMOVAL RIGHT AND LEFT LESIONS OF MANDIBLE;  Surgeon: Diona Browner, DDS;  Location: [facility];  Service: Oral Surgery;  Laterality: N/A;   MALONEY DILATION  12/29/2010   RMR;   NM MYOCAR PERF WALL MOTION  2006   "relavtiely normal" persantine, mild anterior thinning (breast attenuation artifact), no region of scar/ischemia   OVARIAN CYST REMOVAL  RECTOCELE REPAIR N/A 06/29/2015   Procedure: POSTERIOR REPAIR (RECTOCELE);  Surgeon: Jonnie Kind, MD;  Location: [facility];  Service: Gynecology;  Laterality: N/A;   REDUCTION MAMMAPLASTY Bilateral    SPINE SURGERY  09/29/2010   Dr. Rolena Infante   surgical excision of 3 tumors from right thigh and right buttock  and left upper thigh  2010   TOOTH EXTRACTION Bilateral 12/14/2014   Procedure: REMOVAL OF BILATERAL MANDIBULAR EXOSTOSES;  Surgeon: Diona Browner, DDS;  Location: [facility];  Service: Oral Surgery;  Laterality: Bilateral;   TRANSTHORACIC ECHOCARDIOGRAM  2010   EF 60-65%, mild conc LVH, grade 1 diastolic dysfunction; mildly calcified MV annulus with mildly thickened leaflets, mildly calcified MR annulus    Family Psychiatric History: see below  Family History:  Family History  Problem Relation Age of Onset   Heart attack Mother        HTN   Pneumonia Father    Kidney failure Father    Diabetes Father    Pancreatic cancer Sister    Cancer Sister        breast    Cancer Sister        pancreatic   Diabetes Brother    Hypertension Brother    Diabetes Brother    Alcohol abuse Maternal Uncle    Stroke Maternal Grandmother    Heart attack Maternal Grandfather    Hypertension Son    Sleep apnea Son    Colon cancer Neg Hx    Anesthesia problems Neg Hx    Hypotension Neg Hx    Malignant hyperthermia Neg Hx    Pseudochol deficiency Neg  Hx    Breast cancer Neg Hx    Stomach cancer Neg Hx     Social History:  Social History   Socioeconomic History   Marital status: Divorced    Spouse name: Not on file   Number of children: 1   Years of education: 12   Highest education level: High school graduate  Occupational History   Occupation: Disabled  Tobacco Use   Smoking status: Former    Packs/day: 0.25    Years: 30.00    Total pack years: 7.50    Types: Cigarettes    Quit date: 07/05/2021    Years since quitting: 0.2    Passive exposure: Past   Smokeless tobacco: Never   Tobacco comments:    3-4 a day MRC 05/17/2021  Vaping Use   Vaping Use: Never used  Substance and Sexual Activity   Alcohol use: No    Alcohol/week: 0.0 standard drinks of alcohol    Comment:     Drug use: No   Sexual activity: Not Currently  Other Topics Concern   Not on file  Social History Narrative   01/29/18 Lives alone, has 3 aides, Mon- Fri 8 hrs, 2 hrs on Sat-Sun, RN manages her meds   Caffeine use: Drink coffee sometimes    Right handed    Social Determinants of Health   Financial Resource Strain: Low Risk  (07/11/2021)   Overall Financial Resource Strain (CARDIA)    Difficulty of Paying Living Expenses: Not very hard  Food Insecurity: No Food Insecurity (08/23/2021)   Hunger Vital Sign    Worried About Running Out of Food in the Last Year: Never true    Ran Out of Food in the Last Year: Never true  Transportation Needs: No Transportation Needs (08/23/2021)   PRAPARE - Hydrologist (Medical): No  Lack of Transportation (Non-Medical): No  Physical Activity: Inactive (07/11/2021)   Exercise Vital Sign    Days of Exercise per Week: 0 days    Minutes of Exercise per Session: 0 min  Stress: No Stress Concern Present (07/11/2021)   [place]    Feeling of Stress : Only a little  Social Connections: Moderately Isolated (07/11/2021)   Social  Connection and Isolation Panel [NHANES]    Frequency of Communication with Friends and Family: Three times a week    Frequency of Social Gatherings with Friends and Family: Twice a week    Attends Religious Services: More than 4 times per year    Active Member of [employer] or Organizations: No    Attends Archivist Meetings: Never    Marital Status: Divorced    Allergies:  Allergies  Allergen Reactions   Iron Nausea And Vomiting    And itching And itching   Milk ([place]) Rash    Doesn't agree with stomach.  Doesn't agree with stomach.  Doesn't agree with stomach.    Penicillins Hives    Has patient had a PCN reaction causing immediate rash, facial/tongue/throat swelling, SOB or lightheadedness with hypotension: Yes Has patient had a PCN reaction causing severe rash involving mucus membranes or skin necrosis: No Has patient had a PCN reaction that required hospitalization No Has patient had a PCN reaction occurring within the last 10 years: No If all of the above answers are "NO", then may proceed with Cephalosporin use.  Other reaction(s): Other (see comments) Has patient had a PCN reaction causing immediate rash, facial/tongue/throat swelling, SOB or lightheadedness with hypotension: Yes Has patient had a PCN reaction causing severe rash involving mucus membranes or skin necrosis: No Has patient had a PCN reaction that required hospitalization No Has patient had a PCN reaction occurring within the last 10 years: No If all of the above answers are "NO", then may proceed with Cephalosporin use. Has patient had a PCN reaction causing immediate rash, facial/tongue/throat swelling, SOB or lightheadedness with hypotension: Yes Has patient had a PCN reaction causing severe rash involving mucus membranes or skin necrosis: No Has patient had a PCN reaction that required hospitalization No Has patient had a PCN reaction occurring within the last 10 years: No If all of the above answers  are "NO", then may proceed with Cephalosporin use. Has patient had a PCN reaction causing immediate rash, facial/tongue/throat swelling, SOB or lightheadedness with hypotension: Yes Has patient had a PCN reaction causing severe rash involving mucus membranes or skin necrosis: No Has patient had a PCN reaction that required hospitalization No Has patient had a PCN reaction occurring within the last 10 years: No If all of the above answers are "NO", then may proceed with Cephalosporin use. Has patient had a PCN reaction causing immediate rash, facial/tongue/throat swelling, SOB or lightheadedness with hypotension: Yes Has patient had a PCN reaction causing sever... (TRUNCATED)   Phenazopyridine Hives   Phenazopyridine Hcl Hives   Cephalexin Hives   Flonase [Fluticasone]     "It gave me ulcers in my nose"   Milk-Related Compounds Other (See Comments)    Doesn't agree with stomach.    Phenazopyridine Hcl Hives          Metabolic Disorder Labs: Lab Results  Component Value Date   HGBA1C 5.5 09/19/2021   MPG 114.02 09/16/2021   MPG 108 09/30/2018   No results found for: "PROLACTIN" Lab Results  Component  Value Date   CHOL 155 05/10/2021   TRIG 200 (H) 05/10/2021   HDL 42 05/10/2021   CHOLHDL 3.7 05/10/2021   VLDL 26 07/19/2015   LDLCALC 79 05/10/2021   LDLCALC 51 09/01/2020   Lab Results  Component Value Date   TSH 0.494 10/04/2021   TSH 0.710 09/16/2021    Therapeutic Level Labs: Lab Results  Component Value Date   LITHIUM <0.06 (L) 10/15/2016   Lab Results  Component Value Date   VALPROATE <10.0 (L) 08/23/2007   No results found for: "CBMZ"  Current Medications: Current Outpatient Medications  Medication Sig Dispense Refill   ACCU-CHEK GUIDE test strip TEST BLOOD SUGAR FOUR TIMES DAILY  AND AS NEEDED 450 strip 2   Accu-Chek Softclix Lancets lancets TEST BLOOD SUGAR THREE TIMES DAILY AS DIRECTED 300 each 2   albuterol (VENTOLIN HFA) 108 (90 Base) MCG/ACT inhaler  INHALE 1 TO 2 PUFFS EVERY 6 HOURS AS NEEDED FOR WHEEZING, SHORTNESS OF BREATH (Patient taking differently: Inhale 1-2 puffs into the lungs every 6 (six) hours as needed for wheezing or shortness of breath.) 3 each 3   Alcohol Swabs (DROPSAFE ALCOHOL PREP) 70 % PADS USE TO CLEAN FINGER PRIOR TO TESTING FOR BLOOD SUGAR  AS DIRECTED 300 each 1   alendronate (FOSAMAX) 70 MG tablet TAKE 1 TABLET (70 MG TOTAL) BY MOUTH EVERY 7 (SEVEN) DAYS. TAKE WITH A FULL GLASS OF WATER ON AN EMPTY STOMACH. 12 tablet 3   amLODipine (NORVASC) 10 MG tablet TAKE 1 TABLET EVERY DAY (Patient taking differently: Take 10 mg by mouth daily.) 90 tablet 1   ascorbic acid (VITAMIN C) 500 MG tablet Take 500 mg by mouth daily.     aspirin EC 81 MG tablet Take 1 tablet (81 mg total) by mouth daily with breakfast. 120 tablet 2   Blood Glucose Calibration (ACCU-CHEK GUIDE CONTROL) LIQD USE AS DIRECTED 1 each 0   blood glucose meter kit and supplies Dispense based on patient and insurance preference. Use up to four times daily as directed. (FOR ICD-10 E10.9, E11.9). 1 each 0   buPROPion (WELLBUTRIN XL) 150 MG 24 hr tablet Take 1 tablet (150 mg total) by mouth every morning. 90 tablet 2   Calcium Carb-Cholecalciferol (CALTRATE 600+D3 SOFT) 600-20 MG-MCG CHEW Chew 1 tablet by mouth daily at 12 noon.     cetirizine (ZYRTEC) 10 MG tablet Take 1 tablet (10 mg total) by mouth daily. 30 tablet 1   Cholecalciferol (D3 PO) Take 1 tablet by mouth daily.     Clobetasol Prop Emollient Base (CLOBETASOL PROPIONATE E) 0.05 % emollient cream Apply to affected area qd (Patient taking differently: Apply 1 Application topically daily.) 60 g 6   Continuous Blood Gluc Sensor (FREESTYLE LIBRE 14 DAY SENSOR) MISC 1 each by Does not apply route every 14 (fourteen) days. Change every 2 weeks 2 each 11   diclofenac Sodium (VOLTAREN) 1 % GEL APPLY 2 GRAMS TOPICALLY FOUR TIMES DAILY. (Patient taking differently: Apply 2 g topically 4 (four) times daily.) 700 g 0    Docusate Sodium (DSS) 100 MG CAPS Take 1 tablet by mouth in the morning and at bedtime.     DROPLET PEN NEEDLES 31G X 8 MM MISC USE FOR INJECTING INSULIN 4 TIMES DAILY. 400 each 0   ezetimibe (ZETIA) 10 MG tablet TAKE 1 TABLET EVERY DAY (Patient taking differently: Take 10 mg by mouth daily.) 90 tablet 1   insulin aspart (NOVOLOG FLEXPEN) 100 UNIT/ML FlexPen Inject 5 Units  into the skin 3 (three) times daily with meals. Give if eats 50% or more of meal. (Patient taking differently: Inject 14 Units into the skin 3 (three) times daily with meals. Give if eats 50% or more of meal.) 60 mL 0   insulin glargine, 2 Unit Dial, (TOUJEO MAX SOLOSTAR) 300 UNIT/ML Solostar Pen Inject 20-25 Units into the skin daily. 12 mL 0   lamoTRIgine (LAMICTAL) 100 MG tablet Take 1 tablet (100 mg total) by mouth 2 (two) times daily. 180 tablet 2   levothyroxine (SYNTHROID) 50 MCG tablet TAKE 1 TABLET EVERY DAY FOR 6 DAYS PER WEEK, 1/2 TABLET 1 DAY PER WEEK 90 tablet 1   LORazepam (ATIVAN) 0.5 MG tablet Take 1 tablet (0.5 mg total) by mouth 2 (two) times daily. 60 tablet 2   losartan (COZAAR) 50 MG tablet TAKE 1 TABLET EVERY DAY 90 tablet 1   meclizine (ANTIVERT) 25 MG tablet Take 1 tablet (25 mg total) by mouth 3 (three) times daily as needed for dizziness. 30 tablet 0   metoprolol tartrate (LOPRESSOR) 50 MG tablet TAKE 1 TABLET TWICE DAILY (NEED MD APPOINTMENT) (Patient taking differently: Take 50 mg by mouth 2 (two) times daily.) 180 tablet 3   Misc. Devices (MATTRESS PAD) MISC FIRM MATTRESS PAD for hospital bed x 1 1 each 0   montelukast (SINGULAIR) 10 MG tablet TAKE 1 TABLET EVERY DAY (Patient taking differently: Take 10 mg by mouth at bedtime.) 90 tablet 1   Multiple Vitamins-Minerals (MULTIVITAMIN GUMMIES ADULT PO) Take 1 tablet by mouth daily.     MYRBETRIQ 25 MG TB24 tablet TAKE 1 TABLET EVERY DAY 90 tablet 1   nystatin (MYCOSTATIN/NYSTOP) powder APPLY TO AFFECTED AREA 4 TIMES DAILY. (Patient taking differently: Apply  1 Application topically in the morning, at noon, in the evening, and at bedtime.) 30 g 2   Omega-3 Fatty Acids (FISH OIL PO) Take 1 capsule by mouth daily.     ondansetron (ZOFRAN) 4 MG tablet Take 1 tablet (4 mg total) by mouth every 8 (eight) hours as needed for nausea or vomiting. 20 tablet 2   oxyCODONE-acetaminophen (PERCOCET) 10-325 MG tablet Take 1 tablet by mouth every 8 (eight) hours as needed for pain.     pregabalin (LYRICA) 75 MG capsule Take 1 capsule (75 mg total) by mouth daily.     RABEprazole (ACIPHEX) 20 MG tablet Take 1 tablet (20 mg total) by mouth 2 (two) times daily. 180 tablet 2   RESTASIS 0.05 % ophthalmic emulsion Place 2 drops into both eyes daily.     risperiDONE (RISPERDAL) 0.5 MG tablet Take 1 tablet (0.5 mg total) by mouth at bedtime. 90 tablet 2   sertraline (ZOLOFT) 100 MG tablet Take 1 tablet (100 mg total) by mouth every morning. 90 tablet 2   STIOLTO RESPIMAT 2.5-2.5 MCG/ACT AERS INHALE 2 PUFFS INTO THE LUNGS DAILY. (Patient taking differently: Inhale 2 puffs into the lungs daily.) 12 g 1   traZODone (DESYREL) 150 MG tablet Take 1 tablet (150 mg total) by mouth at bedtime. 90 tablet 2   vitamin E 180 MG (400 UNITS) capsule Take 400 Units by mouth daily.     No current facility-administered medications for this visit.     Musculoskeletal: Strength & Muscle Tone: na Gait & Station: na Patient leans: N/A  Psychiatric Specialty Exam: Review of Systems  Constitutional:  Positive for fatigue.  Musculoskeletal:  Positive for arthralgias, back pain and gait problem.  All other systems reviewed and are  negative.   There were no vitals taken for this visit.There is no height or weight on file to calculate BMI.  General Appearance: NA  Eye Contact:  NA  Speech:  Clear and Coherent  Volume:  Normal  Mood:  Anxious and Euthymic  Affect:  NA  Thought Process:  Goal Directed  Orientation:  Full (Time, Place, and Person)  Thought Content: Rumination   Suicidal  Thoughts:  No  Homicidal Thoughts:  No  Memory:  Immediate;   Good Recent;   Good Remote;   NA  Judgement:  Fair  Insight:  Fair  Psychomotor Activity:  Decreased  Concentration:  Concentration: Fair and Attention Span: Fair  Recall:  [employer] of Knowledge: Fair  Language: Good  Akathisia:  No  Handed:  Right  AIMS (if indicated): not done  Assets:  Communication Skills Desire for Improvement Resilience Social Support  ADL's:  Intact  Cognition: WNL  Sleep:  Fair   Screenings: Mini-Mental    [place] Office Visit from 11/24/2020 in [facility] Office Visit from 02/03/2019 in [facility] Office Visit from 09/08/2015 in [facility] Office Visit from 07/02/2014 in [facility] from 12/08/2013 in [facility]  Total Score (max 30 points ) 23 27 24 26 27       PHQ2-9    Flowsheet Row Video Visit from 10/12/2021 in [facility] Office Visit from 09/22/2021 in [facility] Office Visit from 08/31/2021 in [facility] from 08/23/2021 in [facility] Visit from 08/04/2021 in [facility]  PHQ-2 Total Score 1 0 0 0 0      Flowsheet Row Video Visit from 10/12/2021 in [facility] to Hosp-Admission (Discharged) from 10/03/2021 in [facility] ED to Hosp-Admission (Discharged) from 09/22/2021 in [facility] No Risk No Risk No Risk        Assessment and Plan: Patient is a 71 year old female with a history of depression anxiety and mood swings.  I spoke to her again about possibly getting off some of the polypharmacy but she is reluctant to do so.  We will try to continue to talk about this.  For now she will continue trazodone 150 mg at bedtime for sleep, Wellbutrin XL 150 mg daily as  well as Zoloft 100 mg daily for depression Lamictal 100 mg twice daily for mood stabilization, Ativan  Collaboration of Care: Collaboration of Care: Primary Care Provider AEB   notes are shared with PCP through the epic system  Patient/Guardian was advised Release of Information must be obtained prior to any record release in order to collaborate their care with an outside provider. Patient/Guardian was advised if they have not already done so to contact the registration department to sign all necessary forms in order for [name] to release information regarding their care.   Consent: Patient/Guardian gives verbal consent for treatment and assignment of benefits for services provided during this visit. Patient/Guardian expressed understanding and agreed to proceed.    [name] Spiller, MD 10/12/2021, 4:31 PM

## 2021-10-12 NOTE — Patient Outreach (Signed)
[facility]) Care Management  10/12/2021  Kennedie Pardoe [place] 04/22/1950 [id number]  [place] [facility] [ACO] Patient: [name]  Readmission report referral for review patient was at [facility]  Patient active with Embedded CCM with RNCM.  EMR was reviewed your referral request and information sent for previous admission.  Natividad Brood, RN BSN [facility] Liaison  [phone] business mobile phone Toll free office [phone]  Fax number: [phone] [email or web address]

## 2021-10-13 ENCOUNTER — Other Ambulatory Visit: Payer: Self-pay | Admitting: Physician Assistant

## 2021-10-13 ENCOUNTER — Telehealth: Payer: Self-pay

## 2021-10-13 ENCOUNTER — Inpatient Hospital Stay: Payer: Medicare HMO | Admitting: Internal Medicine

## 2021-10-13 NOTE — Telephone Encounter (Signed)
Kim called from [facility]. Patient weight this am was 166 lbs.   Kim asked for nurse to return call back # [phone]

## 2021-10-13 NOTE — Telephone Encounter (Signed)
Night time meds are missing, medications are off, confused about her doctors which is for which. [name] is worried caling to give a report on the patient, her weight is also changed last week she weighed 166, today she weighed 156, she will be coming on 08/07 to follow up with you she is a [name] patient.

## 2021-10-13 NOTE — Progress Notes (Signed)
Treatment plan changed to [place] due to Ecologist.

## 2021-10-14 ENCOUNTER — Ambulatory Visit (INDEPENDENT_AMBULATORY_CARE_PROVIDER_SITE_OTHER): Payer: Medicare HMO | Admitting: *Deleted

## 2021-10-14 ENCOUNTER — Encounter: Payer: Self-pay | Admitting: *Deleted

## 2021-10-14 DIAGNOSIS — J449 Chronic obstructive pulmonary disease, unspecified: Secondary | ICD-10-CM

## 2021-10-14 DIAGNOSIS — E1143 Type 2 diabetes mellitus with diabetic autonomic (poly)neuropathy: Secondary | ICD-10-CM

## 2021-10-14 DIAGNOSIS — I10 Essential (primary) hypertension: Secondary | ICD-10-CM

## 2021-10-14 NOTE — Chronic Care Management (AMB) (Signed)
Chronic Care Management   CCM RN Visit Note  10/14/2021 Name: [name] MRN: [id number] DOB: 20-Nov-1950  Subjective: [name] is a 71 y.o. year old female who is a primary care patient of [name], MD. The care management team was consulted for assistance with disease management and care coordination needs.    Engaged with patient by telephone for follow up visit in response to provider referral for case management and/or care coordination services.   Consent to Services:  The patient was given information about Chronic Care Management services, agreed to services, and gave verbal consent prior to initiation of services.  Please see initial visit note for detailed documentation.   Patient agreed to services and verbal consent obtained.   Assessment: Review of patient past medical history, allergies, medications, health status, including review of consultants reports, laboratory and other test data, was performed as part of comprehensive evaluation and provision of chronic care management services.   SDOH (Social Determinants of Health) assessments and interventions performed:    CCM Care Plan  Allergies  Allergen Reactions   Iron Nausea And Vomiting    And itching And itching   Milk (Cow) Rash    Doesn't agree with stomach.  Doesn't agree with stomach.  Doesn't agree with stomach.    Penicillins Hives    Has patient had a PCN reaction causing immediate rash, facial/tongue/throat swelling, SOB or lightheadedness with hypotension: Yes Has patient had a PCN reaction causing severe rash involving mucus membranes or skin necrosis: No Has patient had a PCN reaction that required hospitalization No Has patient had a PCN reaction occurring within the last 10 years: No If all of the above answers are "NO", then may proceed with Cephalosporin use.  Other reaction(s): Other (see comments) Has patient had a PCN reaction causing immediate rash, facial/tongue/throat  swelling, SOB or lightheadedness with hypotension: Yes Has patient had a PCN reaction causing severe rash involving mucus membranes or skin necrosis: No Has patient had a PCN reaction that required hospitalization No Has patient had a PCN reaction occurring within the last 10 years: No If all of the above answers are "NO", then may proceed with Cephalosporin use. Has patient had a PCN reaction causing immediate rash, facial/tongue/throat swelling, SOB or lightheadedness with hypotension: Yes Has patient had a PCN reaction causing severe rash involving mucus membranes or skin necrosis: No Has patient had a PCN reaction that required hospitalization No Has patient had a PCN reaction occurring within the last 10 years: No If all of the above answers are "NO", then may proceed with Cephalosporin use. Has patient had a PCN reaction causing immediate rash, facial/tongue/throat swelling, SOB or lightheadedness with hypotension: Yes Has patient had a PCN reaction causing severe rash involving mucus membranes or skin necrosis: No Has patient had a PCN reaction that required hospitalization No Has patient had a PCN reaction occurring within the last 10 years: No If all of the above answers are "NO", then may proceed with Cephalosporin use. Has patient had a PCN reaction causing immediate rash, facial/tongue/throat swelling, SOB or lightheadedness with hypotension: Yes Has patient had a PCN reaction causing sever... (TRUNCATED)   Phenazopyridine Hives   Phenazopyridine Hcl Hives   Cephalexin Hives   Flonase [Fluticasone]     "It gave me ulcers in my nose"   Milk-Related Compounds Other (See Comments)    Doesn't agree with stomach.    Phenazopyridine Hcl Hives  Outpatient Encounter Medications as of 10/14/2021  Medication Sig Note   ACCU-CHEK GUIDE test strip TEST BLOOD SUGAR FOUR TIMES DAILY  AND AS NEEDED    Accu-Chek Softclix Lancets lancets TEST BLOOD SUGAR THREE TIMES DAILY AS DIRECTED     albuterol (VENTOLIN HFA) 108 (90 Base) MCG/ACT inhaler INHALE 1 TO 2 PUFFS EVERY 6 HOURS AS NEEDED FOR WHEEZING, SHORTNESS OF BREATH (Patient taking differently: Inhale 1-2 puffs into the lungs every 6 (six) hours as needed for wheezing or shortness of breath.)    Alcohol Swabs (DROPSAFE ALCOHOL PREP) 70 % PADS USE TO CLEAN FINGER PRIOR TO TESTING FOR BLOOD SUGAR  AS DIRECTED    alendronate (FOSAMAX) 70 MG tablet TAKE 1 TABLET (70 MG TOTAL) BY MOUTH EVERY 7 (SEVEN) DAYS. TAKE WITH A FULL GLASS OF WATER ON AN EMPTY STOMACH. 09/23/2021: Sunday    amLODipine (NORVASC) 10 MG tablet TAKE 1 TABLET EVERY DAY (Patient taking differently: Take 10 mg by mouth daily.)    ascorbic acid (VITAMIN C) 500 MG tablet Take 500 mg by mouth daily.    aspirin EC 81 MG tablet Take 1 tablet (81 mg total) by mouth daily with breakfast.    Blood Glucose Calibration (ACCU-CHEK GUIDE CONTROL) LIQD USE AS DIRECTED    blood glucose meter kit and supplies Dispense based on patient and insurance preference. Use up to four times daily as directed. (FOR ICD-10 E10.9, E11.9).    buPROPion (WELLBUTRIN XL) 150 MG 24 hr tablet Take 1 tablet (150 mg total) by mouth every morning.    Calcium Carb-Cholecalciferol (CALTRATE 600+D3 SOFT) 600-20 MG-MCG CHEW Chew 1 tablet by mouth daily at 12 noon.    cetirizine (ZYRTEC) 10 MG tablet Take 1 tablet (10 mg total) by mouth daily.    Cholecalciferol (D3 PO) Take 1 tablet by mouth daily. 09/16/2021: Pt does not know how many units   Clobetasol Prop Emollient Base (CLOBETASOL PROPIONATE E) 0.05 % emollient cream Apply to affected area qd (Patient taking differently: Apply 1 Application topically daily.)    Continuous Blood Gluc Sensor (FREESTYLE LIBRE 14 DAY SENSOR) MISC 1 each by Does not apply route every 14 (fourteen) days. Change every 2 weeks    diclofenac Sodium (VOLTAREN) 1 % GEL APPLY 2 GRAMS TOPICALLY FOUR TIMES DAILY. (Patient taking differently: Apply 2 g topically 4 (four) times daily.)     Docusate Sodium (DSS) 100 MG CAPS Take 1 tablet by mouth in the morning and at bedtime.    DROPLET PEN NEEDLES 31G X 8 MM MISC USE FOR INJECTING INSULIN 4 TIMES DAILY.    ezetimibe (ZETIA) 10 MG tablet TAKE 1 TABLET EVERY DAY (Patient taking differently: Take 10 mg by mouth daily.)    insulin aspart (NOVOLOG FLEXPEN) 100 UNIT/ML FlexPen Inject 5 Units into the skin 3 (three) times daily with meals. Give if eats 50% or more of meal. (Patient taking differently: Inject 14 Units into the skin 3 (three) times daily with meals. Give if eats 50% or more of meal.)    insulin glargine, 2 Unit Dial, (TOUJEO MAX SOLOSTAR) 300 UNIT/ML Solostar Pen Inject 20-25 Units into the skin daily.    lamoTRIgine (LAMICTAL) 100 MG tablet Take 1 tablet (100 mg total) by mouth 2 (two) times daily.    levothyroxine (SYNTHROID) 50 MCG tablet TAKE 1 TABLET EVERY DAY FOR 6 DAYS PER WEEK, 1/2 TABLET 1 DAY PER WEEK    LORazepam (ATIVAN) 0.5 MG tablet Take 1 tablet (0.5 mg total) by mouth 2 (  two) times daily.    losartan (COZAAR) 50 MG tablet TAKE 1 TABLET EVERY DAY    meclizine (ANTIVERT) 25 MG tablet Take 1 tablet (25 mg total) by mouth 3 (three) times daily as needed for dizziness.    metoprolol tartrate (LOPRESSOR) 50 MG tablet TAKE 1 TABLET TWICE DAILY (NEED MD APPOINTMENT) (Patient taking differently: Take 50 mg by mouth 2 (two) times daily.)    Misc. Devices (MATTRESS PAD) MISC FIRM MATTRESS PAD for hospital bed x 1    montelukast (SINGULAIR) 10 MG tablet TAKE 1 TABLET EVERY DAY (Patient taking differently: Take 10 mg by mouth at bedtime.)    Multiple Vitamins-Minerals (MULTIVITAMIN GUMMIES ADULT PO) Take 1 tablet by mouth daily.    MYRBETRIQ 25 MG TB24 tablet TAKE 1 TABLET EVERY DAY    nystatin (MYCOSTATIN/NYSTOP) powder APPLY TO AFFECTED AREA 4 TIMES DAILY. (Patient taking differently: Apply 1 Application topically in the morning, at noon, in the evening, and at bedtime.)    Omega-3 Fatty Acids (FISH OIL PO) Take 1 capsule  by mouth daily.    ondansetron (ZOFRAN) 4 MG tablet Take 1 tablet (4 mg total) by mouth every 8 (eight) hours as needed for nausea or vomiting.    oxyCODONE-acetaminophen (PERCOCET) 10-325 MG tablet Take 1 tablet by mouth every 8 (eight) hours as needed for pain.    pregabalin (LYRICA) 75 MG capsule Take 1 capsule (75 mg total) by mouth daily.    RABEprazole (ACIPHEX) 20 MG tablet Take 1 tablet (20 mg total) by mouth 2 (two) times daily.    RESTASIS 0.05 % ophthalmic emulsion Place 2 drops into both eyes daily.    risperiDONE (RISPERDAL) 0.5 MG tablet Take 1 tablet (0.5 mg total) by mouth at bedtime.    sertraline (ZOLOFT) 100 MG tablet Take 1 tablet (100 mg total) by mouth every morning.    STIOLTO RESPIMAT 2.5-2.5 MCG/ACT AERS INHALE 2 PUFFS INTO THE LUNGS DAILY. (Patient taking differently: Inhale 2 puffs into the lungs daily.)    traZODone (DESYREL) 150 MG tablet Take 1 tablet (150 mg total) by mouth at bedtime.    vitamin E 180 MG (400 UNITS) capsule Take 400 Units by mouth daily.    No facility-administered encounter medications on file as of 10/14/2021.    Patient Active Problem List   Diagnosis Date Noted   Anemia in chronic kidney disease (CKD) 10/12/2021   Subconjunctival hemorrhage of left eye 10/02/2021   Dehydration 09/25/2021   Encounter for support and coordination of transition of care 09/25/2021   Acute renal failure superimposed on stage 3a chronic kidney disease ([place]) 09/23/2021   AKI (acute kidney injury) ([place]) 09/16/2021   CHF (congestive heart failure) ([place]) 09/16/2021   Abnormal EKG 09/16/2021   Urinary frequency 09/01/2021   Anemia of chronic disease 08/10/2021   Generalized pain 08/04/2021   Sciatica, right side 07/19/2021   Breast pain, left 07/10/2021   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) 07/10/2021   Atherosclerosis of aorta ([place]) 07/10/2021   Generalized weakness 05/12/2021   Advanced care  planning/counseling discussion 12/23/2020   Head trauma, initial encounter 12/20/2020   Trigger finger, right 12/12/2020   Confusion 11/24/2020   Blurry vision 11/18/2020   Cervical radiculopathy 07/09/2020   Closed fracture of lateral malleolus of right fibula 02/27/2020   Headache 02/19/2020   Tubular adenoma of colon 02/09/2019   Benign neoplasm of cerebral meninges ([place]) 11/12/2018   Benign meningioma of brain ([place]) 10/31/2018  Osteoarthritis of both hips 07/06/2018   Lipoma of extremity 12/31/2017   Impingement syndrome of left shoulder region 12/27/2017   Weakness generalized    Leg weakness, bilateral 10/28/2017   Lumbar spondylosis with myelopathy 10/12/2017   Numbness of hand 10/10/2017   Chronic neck pain 07/25/2017   Chronic pain syndrome 07/25/2017   At risk for cardiovascular event 32/54/9826   Acute metabolic encephalopathy 41/58/3094   Diabetic polyneuropathy associated with type 2 diabetes mellitus ([place]) 05/19/2016   Tobacco user 04/24/2016   Obesity (BMI 30.0-34.9) 04/24/2016   History of palpitations 08/09/2015   Labile hypertension 08/03/2015   Normal coronary arteries 08/03/2015   Dizziness 07/15/2015   Left-sided low back pain with left-sided sciatica 06/27/2015   Multinodular goiter 05/06/2015   Rectocele, female 04/27/2015   Anal sphincter incontinence 04/27/2015   Pelvic relaxation due to rectocele 03/30/2015   Hyperkalemia 02/22/2015   Pulmonary hypertension ([place]) 02/22/2015   Posterior chest pain 02/21/2015   Episodic cigarette smoking dependence 01/11/2015   Migraine without aura and without status migrainosus, not intractable 07/02/2014   Flatulence 02/18/2014   Microcytic anemia 02/18/2014   Acquired hypothyroidism 08/16/2013   Gastroparesis 04/28/2013   Nicotine dependence 03/09/2013   Seizure disorder ([place]) 01/19/2013   Displacement of cervical intervertebral disc without myelopathy 12/13/2012   Bursitis of shoulder 12/13/2012   UTI  (urinary tract infection) 12/01/2012   Cervical disc disorder with radiculopathy of cervical region 10/31/2012   Solitary pulmonary nodule 08/19/2012   Anemia 07/05/2012   Hypersomnia disorder related to a known organic factor 06/11/2012   Pruritus 04/18/2012   Meningioma ([place]) 11/19/2011   Mononeuritis leg 10/25/2011   Carpal tunnel syndrome of right wrist 05/23/2011   Chronic pain of right hand 05/04/2011   Polypharmacy 04/28/2011   Mood disorder ([place]) 04/28/2011   Constipation 04/13/2011   Recurrent falls 12/12/2010   Oropharyngeal dysphagia 07/12/2010   Urinary incontinence 12/16/2009   HEARING LOSS 10/26/2009   DMII (diabetes mellitus, type 2) ([place]) 07/07/2009   Mixed hyperlipidemia 12/11/2008   IBS 12/11/2008   GERD 07/29/2008   MILK PRODUCTS ALLERGY 07/29/2008   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/03/2007   Essential hypertension 06/27/2007   Backache 06/19/2007   Osteoporosis 06/19/2007   Obstructive sleep apnea 06/19/2007   TRIGGER FINGER 04/18/2007   DIVERTICULOSIS, COLON 11/13/2006    Conditions to be addressed/monitored:CHF, HTN, COPD, and DMII  Care Plan : RN Care Manager Plan of Care  Updates made by Kassie Mends, RN since 10/14/2021 12:00 AM     Problem: No plan of care established for management of chronic disease state  (HTN, COPD, DM2, CHF, High risk for falls)   Priority: High     Long-Range Goal: Development of plan of care for chronic disease management  (HTN, COPD, DM2, CHF, High risk for falls)   Start Date: 08/23/2021  Expected End Date: 02/19/2022  Priority: High  Note:   Current Barriers:  Knowledge Deficits related to plan of care for management of HTN, COPD, and DMII  Chronic Disease Management support and education needs related to HTN, COPD, and DMII Patient reports she lives alone, has CAP aide 8 hours daily x 5 days per week, has church members that assist her frequently, pt does not drive, has necessary  DME in the home and recently got a new a walker, pt reports she "slid down in the bathtub, not a fall" and went to hospital for weakness, discharged 10/05/21 (pt reports she has home  health RN and PT through [facility]), pt reports she has shower chair and aide assists as needed with bathing, cooking, cleaning, etc.  Pt reports she checks blood pressure on occasion with "readings usually good", checks CBG QID with fasting reading today 105, random ranges are usually 118-120, follows special diet "sometimes", smokes 2 cigarettes per day and is still working on complete smoking cessation.  Pt reports she does not feel depressed today and sees LCSW Maurice Small for counseling and feels this has helped tremendously.  Patient reports >3 falls in the past year.  Patient states her aide is with her now and home health nurse is at her apartment also, pt reports she has all medications and taking as prescribed. 10/14/21- RN care manager received message from hospital liason that pt requests return phone call, RN care manager spoke with patient who reports she is to follow up with primary care provider office on 10/17/21 but is not getting to see her "regular primary doctor" and pt feels she should be able to see her "regular doctor". Patient states she will discuss with doctor's office on 10/17/21, pt states aide is there today and doing laundry, home health PT saw pt today, pt denies any falls, has medication and taking as prescribed, pt reports weighing daily with weight today 151 pounds, pt continues checking blood pressure but cannot find log at present.  RNCM Clinical Goal(s):  Patient will verbalize understanding of plan for management of HTN, COPD, and DMII as evidenced by patient report, review of EHR and  through collaboration with RN Care manager, provider, and care team.   Interventions: 1:1 collaboration with primary care provider regarding development and update of comprehensive plan of care as evidenced by  provider attestation and co-signature Inter-disciplinary care team collaboration (see longitudinal plan of care) Evaluation of current treatment plan related to  self management and patient's adherence to plan as established by provider  Heart Failure Interventions:  (Status:  New goal. and Goal on track:  Yes.) Long Term Goal Basic overview and discussion of pathophysiology of Heart Failure reviewed Reviewed Heart Failure Action Plan in depth and provided written copy Assessed need for readable accurate scales in home Provided education about placing scale on hard, flat surface Advised patient to weigh each morning after emptying bladder Discussed importance of daily weight and advised patient to weigh and record daily Discussed the importance of keeping all appointments with provider   COPD Interventions:  (Status:  New goal. and Goal on track:  Yes.) Long Term Goal Provided patient with basic written and verbal COPD education on self care/management/and exacerbation prevention Advised patient to self assesses COPD action plan zone and make appointment with provider if in the yellow zone for 48 hours without improvement Advised patient to engage in light exercise as tolerated 3-5 days a week to aid in the the management of COPD Provided education about and advised patient to utilize infection prevention strategies to reduce risk of respiratory infection Discussed the importance of adequate rest and management of fatigue with COPD Reviewed COPD action plan Reviewed safety precautions and importance of asking for assistance as needed and always using walker  Diabetes Interventions:  (Status:  New goal. and Goal on track:  Yes.) Long Term Goal Assessed patient's understanding of A1c goal: <7% Reviewed medications with patient and discussed importance of medication adherence Counseled on importance of regular laboratory monitoring as prescribed Review of patient status, including review of  consultants reports, relevant laboratory and other test results, and  medications completed Screening for signs and symptoms of depression related to chronic disease state  Reviewed importance of working with home health PT for strengthening Reinforced carbohydrate modified diet Reviewed CBG log with patient  Lab Results  Component Value Date   HGBA1C 5.9 (A) 05/20/2021   Hypertension Interventions:  (Status:  New goal. and Goal on track:  Yes.) Long Term Goal Last practice recorded BP readings:  BP Readings from Last 3 Encounters:  08/16/21 137/62  08/10/21 (!) 164/68  07/19/21 116/62  Most recent eGFR/CrCl:  Lab Results  Component Value Date   EGFR 52 (L) 05/10/2021    No components found for: "CRCL"  Evaluation of current treatment plan related to hypertension self management and patient's adherence to plan as established by provider Counseled on the importance of exercise goals with target of 150 minutes per week Discussed plans with patient for ongoing care management follow up and provided patient with direct contact information for care management team Advised patient, providing education and rationale, to monitor blood pressure daily and record, calling PCP for findings outside established parameters Discussed complications of poorly controlled blood pressure such as heart disease, stroke, circulatory complications, vision complications, kidney impairment, sexual dysfunction Reinforced importance of following low sodium diet  Patient Goals/Self-Care Activities: Take medications as prescribed   Attend all scheduled provider appointments Call pharmacy for medication refills 3-7 days in advance of running out of medications Attend church or other social activities Perform all self care activities independently  Perform IADL's (shopping, preparing meals, housekeeping, managing finances) independently Call provider office for new concerns or questions  check blood sugar at  prescribed times: 4 times daily check feet daily for cuts, sores or redness enter blood sugar readings and medication or insulin into daily log take the blood sugar log to all doctor visits take the blood sugar meter to all doctor visits trim toenails straight across fill half of plate with vegetables limit fast food meals to no more than 1 per week prepare main meal at home 3 to 5 days each week wash and dry feet carefully every day wear comfortable, cotton socks eliminate smoking in my home identify and remove indoor air pollutants develop a rescue plan follow rescue plan if symptoms flare-up eat healthy/prescribed diet: carbohydrate modified, low sodium, heart healthy check blood pressure 3 times per week write blood pressure results in a log or diary take blood pressure log to all doctor appointments take medications for blood pressure exactly as prescribed eat more whole grains, fruits and vegetables, lean meats and healthy fats Follow low sodium diet Look over CHF action plan Continue to weigh daily Follow up with primary care provider 10/17/21 Adhere to COPD action plan Continue doing exercises prescribed by physical therapy fall prevention strategies: change position slowly, use assistive device such as walker or cane (per provider recommendations) when walking, keep walkways clear, have good lighting in room. It is important to contact your provider if you have any falls, maintain muscle strength/tone by exercise per provider recommendations.       Plan:Telephone follow up appointment with care management team member scheduled for:  11/17/21  Jacqlyn Larsen [facility], BSN RN Case Manager [facility] [phone]

## 2021-10-14 NOTE — Telephone Encounter (Signed)
Okay, thank you!

## 2021-10-14 NOTE — Patient Instructions (Signed)
Visit Information  Thank you for taking time to visit with me today. Please don't hesitate to contact me if I can be of assistance to you before our next scheduled telephone appointment.  Following are the goals we discussed today:  Take medications as prescribed   Attend all scheduled provider appointments Call pharmacy for medication refills 3-7 days in advance of running out of medications Attend church or other social activities Perform all self care activities independently  Perform IADL's (shopping, preparing meals, housekeeping, managing finances) independently Call provider office for new concerns or questions  check blood sugar at prescribed times: 4 times daily check feet daily for cuts, sores or redness enter blood sugar readings and medication or insulin into daily log take the blood sugar log to all doctor visits take the blood sugar meter to all doctor visits trim toenails straight across fill half of plate with vegetables limit fast food meals to no more than 1 per week prepare main meal at home 3 to 5 days each week wash and dry feet carefully every day wear comfortable, cotton socks eliminate smoking in my home identify and remove indoor air pollutants develop a rescue plan follow rescue plan if symptoms flare-up eat healthy/prescribed diet: carbohydrate modified, low sodium, heart healthy check blood pressure 3 times per week write blood pressure results in a log or diary take blood pressure log to all doctor appointments take medications for blood pressure exactly as prescribed eat more whole grains, fruits and vegetables, lean meats and healthy fats Follow low sodium diet Look over CHF action plan Continue to weigh daily Follow up with primary care provider 10/17/21 Adhere to COPD action plan Continue doing exercises prescribed by physical therapy fall prevention strategies: change position slowly, use assistive device such as walker or cane (per provider  recommendations) when walking, keep walkways clear, have good lighting in room. It is important to contact your provider if you have any falls, maintain muscle strength/tone by exercise per provider recommendations.  Our next appointment is by telephone on 11/17/21 at 1045 am  Please call the care guide team at [phone] if you need to cancel or reschedule your appointment.   If you are experiencing a Mental Health or [place] or need someone to talk to, please call the Suicide and Crisis Lifeline: 988 call the [place] National Suicide Prevention Lifeline: 7183610027 or TTY: 281-359-8511 TTY 838-346-3087) to talk to a trained counselor call 1-800-273-TALK (toll free, 24 hour hotline) go to [facility] [address], [place] [phone]) call the [place]: [phone] call 911   Patient verbalizes understanding of instructions and care plan provided today and agrees to view in [place]. Active MyChart status and patient understanding of how to access instructions and care plan via MyChart confirmed with patient.     Jacqlyn Larsen [facility], BSN RN Case Manager [facility] [phone]  Heart Failure Action Plan A heart failure action plan helps you understand what to do when you have symptoms of heart failure. Your action plan is a color-coded plan that lists the symptoms to watch for and indicates what actions to take. If you have symptoms in the red zone, you need medical care right away. If you have symptoms in the yellow zone, you are having problems. If you have symptoms in the green zone, you are doing well. Follow the plan that was created by you and your health care provider. Review your plan each time you visit your  health care provider. Red zone These signs and symptoms mean you should get medical help right away: You have trouble breathing when resting. You have a dry cough that is getting  worse. You have swelling or pain in your legs or abdomen that is getting worse. You suddenly gain more than 2-3 lb (0.9-1.4 kg) in 24 hours, or more than 5 lb (2.3 kg) in a week. This amount may be more or less depending on your condition. You have trouble staying awake or you feel confused. You have chest pain. You do not have an appetite. You pass out. You have worsening sadness or depression. If you have any of these symptoms, call your local emergency services (911 in the [place] right away. Do not drive yourself to the hospital. Yellow zone These signs and symptoms mean your condition may be getting worse and you should make some changes: You have trouble breathing when you are active, or you need to sleep with your head raised on extra pillows to help you breathe. You have swelling in your legs or abdomen. You gain 2-3 lb (0.9-1.4 kg) in 24 hours, or 5 lb (2.3 kg) in a week. This amount may be more or less depending on your condition. You get tired easily. You have trouble sleeping. You have a dry cough. If you have any of these symptoms: Contact your health care provider within the next day. Your health care provider may adjust your medicines. Green zone These signs mean you are doing well and can continue what you are doing: You do not have shortness of breath. You have very little swelling or no new swelling. Your weight is stable (no gain or loss). You have a normal activity level. You do not have chest pain or any other new symptoms. Follow these instructions at home: Take over-the-counter and prescription medicines only as told by your health care provider. Weigh yourself daily. Your target weight is __________ lb (__________ kg). Call your health care provider if you gain more than __________ lb (__________ kg) in 24 hours, or more than __________ lb (__________ kg) in a week. Health care provider name: _____________________________________________________ Health care  provider phone number: _____________________________________________________ Eat a heart-healthy diet. Work with a diet and nutrition specialist (dietitian) to create an eating plan that is best for you. Keep all follow-up visits. This is important. Where to find more information American Heart Association: Summary A heart failure action plan helps you understand what to do when you have symptoms of heart failure. Follow the action plan that was created by you and your health care provider. Get help right away if you have any symptoms in the red zone. This information is not intended to replace advice given to you by your health care provider. Make sure you discuss any questions you have with your health care provider. Document Revised: 06/07/2021 Document Reviewed: 10/13/2019 Elsevier Patient Education  [place].

## 2021-10-17 ENCOUNTER — Ambulatory Visit (INDEPENDENT_AMBULATORY_CARE_PROVIDER_SITE_OTHER): Payer: Medicare HMO | Admitting: Family Medicine

## 2021-10-17 ENCOUNTER — Encounter: Payer: Self-pay | Admitting: Family Medicine

## 2021-10-17 ENCOUNTER — Telehealth: Payer: Self-pay | Admitting: Family Medicine

## 2021-10-17 VITALS — BP 120/72 | HR 69 | Ht 59.0 in | Wt 167.1 lb

## 2021-10-17 DIAGNOSIS — R531 Weakness: Secondary | ICD-10-CM | POA: Diagnosis not present

## 2021-10-17 NOTE — Telephone Encounter (Signed)
[place] Physical Therapist with [facility] called in on patient behalf.  Verbal orders for PT   2 week 2  1 week 3   Call back info  [phone]

## 2021-10-17 NOTE — Patient Instructions (Signed)
I appreciate the opportunity to provide care to you today!    Labs: please stop by the lab today to get your blood drawn (CBC, BMP)    Please continue to a heart-healthy diet and increase your physical activities. Try to exercise for 71mns at least three times a week.      It was a pleasure to see you and I look forward to continuing to work together on your health and well-being. Please do not hesitate to call the office if you need care or have questions about your care.   Have a wonderful day and week. With Gratitude, GAlvira MondayMSN, FNP-BC

## 2021-10-17 NOTE — Progress Notes (Unsigned)
Established Patient Office Visit  Subjective:  Patient ID: [name], female    DOB: May 13, 1950  Age: 71 y.o. MRN: [id number]  CC:  Chief Complaint  Patient presents with   Follow-up    Following up from hospital visit, went in on 07/24, d/c on 07/26. C/o feeling really tired since seen at the ed.     HPI [name] is a 71 y.o. female with past medical history of Recurrent falls, polypharmacy presents for f/u of hospital visit. The patient was seen in the ED from 7/24-7/26 for generalized weakness and fall. She fell backwards in her bathtub and was unable to get up due to increased fatigued. UA and imaging studies were negative and she given IV fluids and evaluated for acute metabolic encephalopathy.  Since discharged from the hospital, the pt c/o increased fatigued. She reports staying hydrated and denies any recent falls or injury.    Past Medical History:  Diagnosis Date   Allergy    Anemia    Anemia in chronic kidney disease (CKD) 08/10/2021   Anemia in chronic kidney disease (CKD) 10/12/2021   Anxiety    takes Ativan daily   Arthritis    Assistance needed for mobility    Bipolar disorder ([place])    takes Risperdal nightly   Blood transfusion    Brain tumor ([place])    Cancer ([place])    In her gum   Carpal tunnel syndrome of right wrist 05/23/2011   Cervical disc disorder with radiculopathy of cervical region 10/31/2012   Chronic back pain    Chronic idiopathic constipation    Chronic neck and back pain    Colon polyps    COPD (chronic obstructive pulmonary disease) with chronic bronchitis ([place]) 09/16/2013   Office Spirometry 10/30/2013-submaximal effort based on appearance of loop and curve. Numbers would fit with severe restriction but her physiologic capability may be better than this. FVC 0.91/44%, and 10.74/45%, FEV1/FVC 0.81, FEF 25-75% 1.43/69%     Diabetes mellitus    Type II   Diverticulosis    TCS 9/08 by Dr. Delfin Edis for diarrhea . Bx for micro scopic  colitis negative.    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   Metabolic encephalopathy 75/88/3254   Migraines    chronic headaches   Mononeuritis lower limb    Narcolepsy    Osteoporosis    Pancreatitis 2006   due to Depakote with normal EUS    Paralysis (HCC)    Schatzki's ring    non critical / EGD with ED 8/2011with RMR   Seizures ([place])    takes Lamictal daily.Last seizure 3 yrs ago   Sleep apnea    on CPAP   Small bowel obstruction (HCC)    Stroke ([place])    left sided weakness, speech changes   Tubular adenoma of colon     Past Surgical History:  Procedure Laterality Date   ABDOMINAL  HYSTERECTOMY  1978   BACK SURGERY  July 2012   BACTERIAL OVERGROWTH TEST N/A 05/05/2013   Procedure: BACTERIAL OVERGROWTH TEST;  Surgeon: Daneil Dolin, MD;  [facility];  Service: Endoscopy;  Laterality: N/A;  7:30   BIOPSY THYROID  2009   BRAIN SURGERY  11/2011   resection of meningioma   BREAST REDUCTION SURGERY  1994   CARDIAC CATHETERIZATION  05/10/2005   normal coronaries, normal LV systolic function and EF (Dr. Jackie Plum)   [place] Left 07/22/04   Dr. Aline Brochure   CATARACT EXTRACTION Bilateral    CHOLECYSTECTOMY  1984   COLONOSCOPY N/A 09/25/2012   LFY:BOFBPZW diverticulosis.  colonic polyp-removed : tubular adenoma   CRANIOTOMY  11/23/2011   Procedure: CRANIOTOMY TUMOR EXCISION;  Surgeon: Hosie Spangle, MD;  Location: [facility];  Service: Neurosurgery;  Laterality: N/A;  Craniotomy for  tumor resection   ESOPHAGOGASTRODUODENOSCOPY  12/29/2010   [place]-Retained food in the esophagus and stomach, small hiatal hernia, status post Maloney dilation of the esophagus   ESOPHAGOGASTRODUODENOSCOPY N/A 09/25/2012   CHE:NIDPOEUM atonic baggy esophagus status post Maloney dilation 33 F. Hiatal hernia   GIVENS CAPSULE STUDY N/A 01/15/2013   NORMAL.    IR GENERIC HISTORICAL  03/17/2016   IR RADIOLOGIST EVAL & MGMT 03/17/2016 [facility]   LESION REMOVAL N/A 05/31/2015   Procedure: REMOVAL RIGHT AND LEFT LESIONS OF MANDIBLE;  Surgeon: Diona Browner, DDS;  Location: [facility];  Service: Oral Surgery;  Laterality: N/A;   MALONEY DILATION  12/29/2010   RMR;   NM MYOCAR PERF WALL MOTION  2006   "relavtiely normal" persantine, mild anterior thinning (breast attenuation artifact), no region of scar/ischemia   OVARIAN CYST REMOVAL     RECTOCELE REPAIR N/A 06/29/2015   Procedure: POSTERIOR REPAIR (RECTOCELE);  Surgeon: Jonnie Kind, MD;  Location: [facility];  Service: Gynecology;  Laterality: N/A;   REDUCTION MAMMAPLASTY Bilateral    SPINE SURGERY  09/29/2010   Dr. Rolena Infante   surgical excision of 3 tumors from right thigh and right buttock  and left upper thigh  2010   TOOTH EXTRACTION Bilateral 12/14/2014   Procedure: REMOVAL OF BILATERAL MANDIBULAR EXOSTOSES;  Surgeon: Diona Browner, DDS;  Location: [place];  Service: Oral Surgery;  Laterality: Bilateral;   TRANSTHORACIC ECHOCARDIOGRAM  2010   EF 60-65%, mild conc LVH, grade 1 diastolic dysfunction; mildly calcified MV annulus with mildly thickened leaflets, mildly calcified MR annulus    Family History  Problem Relation Age of Onset   Heart attack Mother        HTN   Pneumonia Father    Kidney failure Father    Diabetes Father    Pancreatic cancer Sister    Cancer Sister        breast    Cancer Sister        pancreatic   Diabetes Brother    Hypertension Brother    Diabetes Brother    Alcohol abuse Maternal Uncle    Stroke Maternal Grandmother     Heart attack Maternal Grandfather    Hypertension Son    Sleep apnea Son    Colon cancer Neg Hx    Anesthesia problems Neg Hx    Hypotension Neg Hx    Malignant hyperthermia Neg Hx    Pseudochol deficiency Neg Hx    Breast cancer Neg Hx    Stomach cancer Neg Hx     Social History   Socioeconomic History   Marital  status: Divorced    Spouse name: Not on file   Number of children: 1   Years of education: 12   Highest education level: High school graduate  Occupational History   Occupation: Disabled  Tobacco Use   Smoking status: Former    Packs/day: 0.25    Years: 30.00    Total pack years: 7.50    Types: Cigarettes    Quit date: 07/05/2021    Years since quitting: 0.2    Passive exposure: Past   Smokeless tobacco: Never   Tobacco comments:    3-4 a day MRC 05/17/2021  Vaping Use   Vaping Use: Never used  Substance and Sexual Activity   Alcohol use: No    Alcohol/week: 0.0 standard drinks of alcohol    Comment:     Drug use: No   Sexual activity: Not Currently  Other Topics Concern   Not on file  Social History Narrative   01/29/18 Lives alone, has 3 aides, Mon- Fri 8 hrs, 2 hrs on Sat-Sun, RN manages her meds   Caffeine use: Drink coffee sometimes    Right handed    Social Determinants of Health   Financial Resource Strain: Low Risk  (07/11/2021)   Overall Financial Resource Strain (CARDIA)    Difficulty of Paying Living Expenses: Not very hard  Food Insecurity: No Food Insecurity (08/23/2021)   Hunger Vital Sign    Worried About Running Out of Food in the Last Year: Never true    [place] in the Last Year: Never true  Transportation Needs: No Transportation Needs (08/23/2021)   PRAPARE - Hydrologist (Medical): No    Lack of Transportation (Non-Medical): No  Physical Activity: Inactive (07/11/2021)   Exercise Vital Sign    Days of Exercise per Week: 0 days    Minutes of Exercise per Session: 0 min  Stress: No Stress  Concern Present (07/11/2021)   [place]    Feeling of Stress : Only a little  Social Connections: Moderately Isolated (07/11/2021)   Social Connection and Isolation Panel [NHANES]    Frequency of Communication with Friends and Family: Three times a week    Frequency of Social Gatherings with Friends and Family: Twice a week    Attends Religious Services: More than 4 times per year    Active Member of [employer] or Organizations: No    Attends Archivist Meetings: Never    Marital Status: Divorced  Human resources officer Violence: Not At Risk (05/21/2019)   Humiliation, Afraid, Rape, and Kick questionnaire    Fear of Current or Ex-Partner: No    Emotionally Abused: No    Physically Abused: No    Sexually Abused: No    Outpatient Medications Prior to Visit  Medication Sig Dispense Refill   ACCU-CHEK GUIDE test strip TEST BLOOD SUGAR FOUR TIMES DAILY  AND AS NEEDED 450 strip 2   Accu-Chek Softclix Lancets lancets TEST BLOOD SUGAR THREE TIMES DAILY AS DIRECTED 300 each 2   albuterol (VENTOLIN HFA) 108 (90 Base) MCG/ACT inhaler INHALE 1 TO 2 PUFFS EVERY 6 HOURS AS NEEDED FOR WHEEZING, SHORTNESS OF BREATH (Patient taking differently: Inhale 1-2 puffs into the lungs every 6 (six) hours as needed for wheezing or shortness of breath.) 3 each 3   Alcohol Swabs (DROPSAFE ALCOHOL PREP) 70 % PADS USE TO CLEAN FINGER PRIOR TO TESTING FOR BLOOD SUGAR  AS DIRECTED 300 each 1  alendronate (FOSAMAX) 70 MG tablet TAKE 1 TABLET (70 MG TOTAL) BY MOUTH EVERY 7 (SEVEN) DAYS. TAKE WITH A FULL GLASS OF WATER ON AN EMPTY STOMACH. 12 tablet 3   amLODipine (NORVASC) 10 MG tablet TAKE 1 TABLET EVERY DAY (Patient taking differently: Take 10 mg by mouth daily.) 90 tablet 1   ascorbic acid (VITAMIN C) 500 MG tablet Take 500 mg by mouth daily.     aspirin EC 81 MG tablet Take 1 tablet (81 mg total) by mouth daily with breakfast. 120 tablet 2   Blood Glucose  Calibration (ACCU-CHEK GUIDE CONTROL) LIQD USE AS DIRECTED 1 each 0   blood glucose meter kit and supplies Dispense based on patient and insurance preference. Use up to four times daily as directed. (FOR ICD-10 E10.9, E11.9). 1 each 0   buPROPion (WELLBUTRIN XL) 150 MG 24 hr tablet Take 1 tablet (150 mg total) by mouth every morning. 90 tablet 2   Calcium Carb-Cholecalciferol (CALTRATE 600+D3 SOFT) 600-20 MG-MCG CHEW Chew 1 tablet by mouth daily at 12 noon.     cetirizine (ZYRTEC) 10 MG tablet Take 1 tablet (10 mg total) by mouth daily. 30 tablet 1   Cholecalciferol (D3 PO) Take 1 tablet by mouth daily.     Clobetasol Prop Emollient Base (CLOBETASOL PROPIONATE E) 0.05 % emollient cream Apply to affected area qd (Patient taking differently: Apply 1 Application topically daily.) 60 g 6   Continuous Blood Gluc Sensor (FREESTYLE LIBRE 14 DAY SENSOR) MISC 1 each by Does not apply route every 14 (fourteen) days. Change every 2 weeks 2 each 11   diclofenac Sodium (VOLTAREN) 1 % GEL APPLY 2 GRAMS TOPICALLY FOUR TIMES DAILY. (Patient taking differently: Apply 2 g topically 4 (four) times daily.) 700 g 0   Docusate Sodium (DSS) 100 MG CAPS Take 1 tablet by mouth in the morning and at bedtime.     DROPLET PEN NEEDLES 31G X 8 MM MISC USE FOR INJECTING INSULIN 4 TIMES DAILY. 400 each 0   ezetimibe (ZETIA) 10 MG tablet TAKE 1 TABLET EVERY DAY (Patient taking differently: Take 10 mg by mouth daily.) 90 tablet 1   insulin aspart (NOVOLOG FLEXPEN) 100 UNIT/ML FlexPen Inject 5 Units into the skin 3 (three) times daily with meals. Give if eats 50% or more of meal. (Patient taking differently: Inject 14 Units into the skin 3 (three) times daily with meals. Give if eats 50% or more of meal.) 60 mL 0   insulin glargine, 2 Unit Dial, (TOUJEO MAX SOLOSTAR) 300 UNIT/ML Solostar Pen Inject 20-25 Units into the skin daily. 12 mL 0   lamoTRIgine (LAMICTAL) 100 MG tablet Take 1 tablet (100 mg total) by mouth 2 (two) times daily.  180 tablet 2   levothyroxine (SYNTHROID) 50 MCG tablet TAKE 1 TABLET EVERY DAY FOR 6 DAYS PER WEEK, 1/2 TABLET 1 DAY PER WEEK 90 tablet 1   LORazepam (ATIVAN) 0.5 MG tablet Take 1 tablet (0.5 mg total) by mouth 2 (two) times daily. 60 tablet 2   losartan (COZAAR) 50 MG tablet TAKE 1 TABLET EVERY DAY 90 tablet 1   meclizine (ANTIVERT) 25 MG tablet Take 1 tablet (25 mg total) by mouth 3 (three) times daily as needed for dizziness. 30 tablet 0   metoprolol tartrate (LOPRESSOR) 50 MG tablet TAKE 1 TABLET TWICE DAILY (NEED MD APPOINTMENT) (Patient taking differently: Take 50 mg by mouth 2 (two) times daily.) 180 tablet 3   Misc. Devices (MATTRESS PAD) MISC FIRM MATTRESS PAD  for hospital bed x 1 1 each 0   montelukast (SINGULAIR) 10 MG tablet TAKE 1 TABLET EVERY DAY (Patient taking differently: Take 10 mg by mouth at bedtime.) 90 tablet 1   Multiple Vitamins-Minerals (MULTIVITAMIN GUMMIES ADULT PO) Take 1 tablet by mouth daily.     MYRBETRIQ 25 MG TB24 tablet TAKE 1 TABLET EVERY DAY 90 tablet 1   nystatin (MYCOSTATIN/NYSTOP) powder APPLY TO AFFECTED AREA 4 TIMES DAILY. (Patient taking differently: Apply 1 Application topically in the morning, at noon, in the evening, and at bedtime.) 30 g 2   Omega-3 Fatty Acids (FISH OIL PO) Take 1 capsule by mouth daily.     ondansetron (ZOFRAN) 4 MG tablet Take 1 tablet (4 mg total) by mouth every 8 (eight) hours as needed for nausea or vomiting. 20 tablet 2   oxyCODONE-acetaminophen (PERCOCET) 10-325 MG tablet Take 1 tablet by mouth every 8 (eight) hours as needed for pain.     pregabalin (LYRICA) 75 MG capsule Take 1 capsule (75 mg total) by mouth daily.     RABEprazole (ACIPHEX) 20 MG tablet Take 1 tablet (20 mg total) by mouth 2 (two) times daily. 180 tablet 2   RESTASIS 0.05 % ophthalmic emulsion Place 2 drops into both eyes daily.     risperiDONE (RISPERDAL) 0.5 MG tablet Take 1 tablet (0.5 mg total) by mouth at bedtime. 90 tablet 2   sertraline (ZOLOFT) 100 MG  tablet Take 1 tablet (100 mg total) by mouth every morning. 90 tablet 2   STIOLTO RESPIMAT 2.5-2.5 MCG/ACT AERS INHALE 2 PUFFS INTO THE LUNGS DAILY. (Patient taking differently: Inhale 2 puffs into the lungs daily.) 12 g 1   traZODone (DESYREL) 150 MG tablet Take 1 tablet (150 mg total) by mouth at bedtime. 90 tablet 2   vitamin E 180 MG (400 UNITS) capsule Take 400 Units by mouth daily.     No facility-administered medications prior to visit.    Allergies  Allergen Reactions   Iron Nausea And Vomiting    And itching And itching   Milk ([place]) Rash    Doesn't agree with stomach.  Doesn't agree with stomach.  Doesn't agree with stomach.    Penicillins Hives    Has patient had a PCN reaction causing immediate rash, facial/tongue/throat swelling, SOB or lightheadedness with hypotension: Yes Has patient had a PCN reaction causing severe rash involving mucus membranes or skin necrosis: No Has patient had a PCN reaction that required hospitalization No Has patient had a PCN reaction occurring within the last 10 years: No If all of the above answers are "NO", then may proceed with Cephalosporin use.  Other reaction(s): Other (see comments) Has patient had a PCN reaction causing immediate rash, facial/tongue/throat swelling, SOB or lightheadedness with hypotension: Yes Has patient had a PCN reaction causing severe rash involving mucus membranes or skin necrosis: No Has patient had a PCN reaction that required hospitalization No Has patient had a PCN reaction occurring within the last 10 years: No If all of the above answers are "NO", then may proceed with Cephalosporin use. Has patient had a PCN reaction causing immediate rash, facial/tongue/throat swelling, SOB or lightheadedness with hypotension: Yes Has patient had a PCN reaction causing severe rash involving mucus membranes or skin necrosis: No Has patient had a PCN reaction that required hospitalization No Has patient had a PCN reaction  occurring within the last 10 years: No If all of the above answers are "NO", then may proceed with Cephalosporin use. Has  patient had a PCN reaction causing immediate rash, facial/tongue/throat swelling, SOB or lightheadedness with hypotension: Yes Has patient had a PCN reaction causing severe rash involving mucus membranes or skin necrosis: No Has patient had a PCN reaction that required hospitalization No Has patient had a PCN reaction occurring within the last 10 years: No If all of the above answers are "NO", then may proceed with Cephalosporin use. Has patient had a PCN reaction causing immediate rash, facial/tongue/throat swelling, SOB or lightheadedness with hypotension: Yes Has patient had a PCN reaction causing sever... (TRUNCATED)   Phenazopyridine Hives   Phenazopyridine Hcl Hives   Cephalexin Hives   Flonase [Fluticasone]     "It gave me ulcers in my nose"   Milk-Related Compounds Other (See Comments)    Doesn't agree with stomach.    Phenazopyridine Hcl Hives          ROS Review of Systems  Constitutional:  Positive for fatigue. Negative for chills and fever.  Respiratory:  Negative for chest tightness and shortness of breath.   Cardiovascular:  Negative for chest pain.  Gastrointestinal:  Negative for diarrhea, nausea and vomiting.  Neurological:  Negative for dizziness.  Psychiatric/Behavioral:  Negative for self-injury and suicidal ideas.       Objective:    Physical Exam HENT:     Head: Normocephalic.     Right Ear: External ear normal.     Left Ear: External ear normal.  Cardiovascular:     Rate and Rhythm: Normal rate and regular rhythm.     Pulses: Normal pulses.     Heart sounds: Normal heart sounds.  Pulmonary:     Effort: Pulmonary effort is normal.     Breath sounds: Normal breath sounds.  Neurological:     Mental Status: She is alert and oriented to person, place, and time.     BP 120/72   Pulse 69   Ht 4' 11"  (1.499 m)   Wt 167 lb 1.9 oz  (75.8 kg)   SpO2 93%   BMI 33.75 kg/m  Wt Readings from Last 3 Encounters:  10/17/21 167 lb 1.9 oz (75.8 kg)  10/12/21 166 lb 4.8 oz (75.4 kg)  10/03/21 164 lb 0.4 oz (74.4 kg)    Lab Results  Component Value Date   TSH 0.494 10/04/2021   Lab Results  Component Value Date   WBC 7.4 10/17/2021   HGB 8.8 (L) 10/17/2021   HCT 30.6 (L) 10/17/2021   MCV 79 10/17/2021   PLT 244 10/17/2021   Lab Results  Component Value Date   NA 144 10/17/2021   K 4.0 10/17/2021   CO2 23 10/17/2021   GLUCOSE 38 (LL) 10/17/2021   BUN 21 10/17/2021   CREATININE 1.16 (H) 10/17/2021   BILITOT 0.4 10/12/2021   ALKPHOS 54 10/12/2021   AST 24 10/12/2021   ALT 28 10/12/2021   PROT 7.0 10/12/2021   ALBUMIN 3.8 10/12/2021   CALCIUM 9.5 10/17/2021   ANIONGAP 7 10/12/2021   EGFR 50 (L) 10/17/2021   GFR 21.94 (L) 11/03/2014   Lab Results  Component Value Date   CHOL 155 05/10/2021   Lab Results  Component Value Date   HDL 42 05/10/2021   Lab Results  Component Value Date   LDLCALC 79 05/10/2021   Lab Results  Component Value Date   TRIG 200 (H) 05/10/2021   Lab Results  Component Value Date   CHOLHDL 3.7 05/10/2021   Lab Results  Component Value Date   HGBA1C  5.5 09/19/2021      Assessment & Plan:   Problem List Items Addressed This Visit       Other   Generalized weakness - Primary    Labs and imaging reviewed Pending labs Pt is alert and oriented X3 No recent injuries reported Encouraged pt to increased her fluids and solids intake       Relevant Orders   CBC (Completed)   BASIC METABOLIC PANEL WITH GFR    No orders of the defined types were placed in this encounter.   Follow-up: Return if symptoms worsen or fail to improve.    Alvira Monday, FNP

## 2021-10-17 NOTE — Telephone Encounter (Signed)
LVM on confidential voicemail for verbal orders

## 2021-10-18 ENCOUNTER — Telehealth: Payer: Self-pay | Admitting: Family Medicine

## 2021-10-18 ENCOUNTER — Inpatient Hospital Stay: Payer: Medicare HMO | Admitting: Internal Medicine

## 2021-10-18 NOTE — Assessment & Plan Note (Signed)
Labs and imaging reviewed Pending labs Pt is alert and oriented X3 No recent injuries reported Encouraged pt to increased her fluids and solids intake

## 2021-10-18 NOTE — Telephone Encounter (Signed)
Blood sugar was 106 and she is aware to call diabetic dr and schedule an appt asap. Will get with her aid and call us back if she needs transport

## 2021-10-18 NOTE — Telephone Encounter (Signed)
CRITICAL RESULT Glucose of 38. Reported to Dr Moshe Cipro at 10:05am

## 2021-10-18 NOTE — Telephone Encounter (Signed)
Pls call and advise pt of critically low blood sugar at 38, stress importance of eating regularly, have her check blood sugar and you contact her back in 1 mins to get the result. Review fact hat when blood sugar gets too low this is dangerous,most recent hBA1C un July was normal, I recommend as asp appt with her endo doc re med management , currently on 25 to 30 units insulin daily, in the interinm I recommend taking half of her current dose insulin until she sees her Diabeetic doctor, pls send additional concerns to me

## 2021-10-19 ENCOUNTER — Inpatient Hospital Stay: Payer: Medicare HMO

## 2021-10-19 LAB — CBC
Hematocrit: 30.6 % — ABNORMAL LOW (ref 34.0–46.6)
Hemoglobin: 8.8 g/dL — ABNORMAL LOW (ref 11.1–15.9)
MCH: 22.7 pg — ABNORMAL LOW (ref 26.6–33.0)
MCHC: 28.8 g/dL — ABNORMAL LOW (ref 31.5–35.7)
MCV: 79 fL (ref 79–97)
Platelets: 244 10*3/uL (ref 150–450)
RBC: 3.87 x10E6/uL (ref 3.77–5.28)
RDW: 14.5 % (ref 11.7–15.4)
WBC: 7.4 10*3/uL (ref 3.4–10.8)

## 2021-10-19 LAB — BASIC METABOLIC PANEL
BUN/Creatinine Ratio: 18 (ref 12–28)
BUN: 21 mg/dL (ref 8–27)
CO2: 23 mmol/L (ref 20–29)
Calcium: 9.5 mg/dL (ref 8.7–10.3)
Chloride: 106 mmol/L (ref 96–106)
Creatinine, Ser: 1.16 mg/dL — ABNORMAL HIGH (ref 0.57–1.00)
Glucose: 38 mg/dL — CL (ref 70–99)
Potassium: 4 mmol/L (ref 3.5–5.2)
Sodium: 144 mmol/L (ref 134–144)
eGFR: 50 mL/min/{1.73_m2} — ABNORMAL LOW (ref >59)

## 2021-10-20 ENCOUNTER — Encounter: Payer: Self-pay | Admitting: Family Medicine

## 2021-10-21 ENCOUNTER — Telehealth: Payer: Self-pay | Admitting: Family Medicine

## 2021-10-21 NOTE — Telephone Encounter (Signed)
Pt returning call

## 2021-10-21 NOTE — Telephone Encounter (Signed)
I have not called this patient

## 2021-10-21 NOTE — Progress Notes (Signed)
Please inform the patient to increase her carbs and PO intake. Her Blood sugar is low. Her labs also indicates that she's dehydrated; please advise her to increase her fluids intake. I recommend iron rich food like Broccoli, string beans, dark leafy greens like dandelion, collard, kale and spinach.

## 2021-10-22 DIAGNOSIS — M150 Primary generalized (osteo)arthritis: Secondary | ICD-10-CM

## 2021-10-22 DIAGNOSIS — J449 Chronic obstructive pulmonary disease, unspecified: Secondary | ICD-10-CM

## 2021-10-22 DIAGNOSIS — N1832 Chronic kidney disease, stage 3b: Secondary | ICD-10-CM | POA: Diagnosis not present

## 2021-10-22 DIAGNOSIS — I509 Heart failure, unspecified: Secondary | ICD-10-CM | POA: Diagnosis not present

## 2021-10-22 DIAGNOSIS — F250 Schizoaffective disorder, bipolar type: Secondary | ICD-10-CM | POA: Diagnosis not present

## 2021-10-22 DIAGNOSIS — I130 Hypertensive heart and chronic kidney disease with heart failure and stage 1 through stage 4 chronic kidney disease, or unspecified chronic kidney disease: Secondary | ICD-10-CM | POA: Diagnosis not present

## 2021-10-22 DIAGNOSIS — E1142 Type 2 diabetes mellitus with diabetic polyneuropathy: Secondary | ICD-10-CM | POA: Diagnosis not present

## 2021-10-22 DIAGNOSIS — G9341 Metabolic encephalopathy: Secondary | ICD-10-CM | POA: Diagnosis not present

## 2021-10-22 DIAGNOSIS — Z794 Long term (current) use of insulin: Secondary | ICD-10-CM | POA: Diagnosis not present

## 2021-10-22 DIAGNOSIS — R296 Repeated falls: Secondary | ICD-10-CM | POA: Diagnosis not present

## 2021-10-22 DIAGNOSIS — E1122 Type 2 diabetes mellitus with diabetic chronic kidney disease: Secondary | ICD-10-CM | POA: Diagnosis not present

## 2021-10-22 DIAGNOSIS — F39 Unspecified mood [affective] disorder: Secondary | ICD-10-CM

## 2021-10-26 ENCOUNTER — Telehealth: Payer: Self-pay | Admitting: Family Medicine

## 2021-10-26 NOTE — Telephone Encounter (Signed)
[employer] called stating the Rolator she received is messed up and needs a new order for this to be replaced with a new one. Can you please contact pt for details per [facility] request?

## 2021-10-27 ENCOUNTER — Telehealth: Payer: Self-pay | Admitting: Family Medicine

## 2021-10-27 NOTE — Telephone Encounter (Signed)
Pt calling back in regards to message sent yesterday in regards to the Rolator

## 2021-10-28 ENCOUNTER — Telehealth: Payer: Self-pay

## 2021-10-28 NOTE — Telephone Encounter (Signed)
Patient is requesting translator walker 2n1 to a rollator walker. The one she has now the wheels locked up.

## 2021-10-28 NOTE — Telephone Encounter (Signed)
Patient called said her walker is locked up and afraid to use it.  Can patient have another prescription sent to [facility] for another rollator walker (walker with seat).  Something to help her back when she walks.

## 2021-10-28 NOTE — Telephone Encounter (Signed)
Called pt. No answer. LVM. Records show [employer] delivered a blue rolator last month. She will not be able to get another one covered by insurance. If there is a problem with the one she got she needs to call [employer] at [phone] to have someone check about it. Its brand new, it should not be locking up

## 2021-10-28 NOTE — Telephone Encounter (Signed)
See previous message

## 2021-10-31 ENCOUNTER — Ambulatory Visit (HOSPITAL_COMMUNITY): Payer: Medicare HMO | Admitting: Psychiatry

## 2021-10-31 DIAGNOSIS — F331 Major depressive disorder, recurrent, moderate: Secondary | ICD-10-CM

## 2021-10-31 NOTE — Telephone Encounter (Signed)
Patient called said she spoke to nurse from [facility] and said she could get another walker Through [place] [phone]  or [place]  I tired to explain to the patient that insurance will not cover but every 5 years.  Patient said [employer] will not cover the locked up wheels or brakes, told patient does not replace the walker since patient owns the walker.

## 2021-10-31 NOTE — Progress Notes (Signed)
Therapist called patient for scheduled appointment.  However patient was very groggy and reports she did not sleep well last night.  Patient is unable to complete appointment today.  Patient agrees to call and reschedule appointment when she is feeling better.

## 2021-11-01 ENCOUNTER — Telehealth: Payer: Self-pay | Admitting: Family Medicine

## 2021-11-01 ENCOUNTER — Other Ambulatory Visit: Payer: Self-pay | Admitting: Family Medicine

## 2021-11-01 ENCOUNTER — Telehealth: Payer: Self-pay | Admitting: Internal Medicine

## 2021-11-01 ENCOUNTER — Encounter: Payer: Medicare HMO | Attending: Family Medicine | Admitting: Nutrition

## 2021-11-01 DIAGNOSIS — M160 Bilateral primary osteoarthritis of hip: Secondary | ICD-10-CM

## 2021-11-01 DIAGNOSIS — J449 Chronic obstructive pulmonary disease, unspecified: Secondary | ICD-10-CM

## 2021-11-01 DIAGNOSIS — N1832 Chronic kidney disease, stage 3b: Secondary | ICD-10-CM | POA: Diagnosis not present

## 2021-11-01 DIAGNOSIS — E1142 Type 2 diabetes mellitus with diabetic polyneuropathy: Secondary | ICD-10-CM | POA: Diagnosis not present

## 2021-11-01 DIAGNOSIS — R296 Repeated falls: Secondary | ICD-10-CM | POA: Diagnosis not present

## 2021-11-01 DIAGNOSIS — F39 Unspecified mood [affective] disorder: Secondary | ICD-10-CM

## 2021-11-01 DIAGNOSIS — E119 Type 2 diabetes mellitus without complications: Secondary | ICD-10-CM | POA: Diagnosis not present

## 2021-11-01 DIAGNOSIS — Z794 Long term (current) use of insulin: Secondary | ICD-10-CM | POA: Insufficient documentation

## 2021-11-01 DIAGNOSIS — I509 Heart failure, unspecified: Secondary | ICD-10-CM | POA: Diagnosis not present

## 2021-11-01 DIAGNOSIS — G4733 Obstructive sleep apnea (adult) (pediatric): Secondary | ICD-10-CM

## 2021-11-01 DIAGNOSIS — G9341 Metabolic encephalopathy: Secondary | ICD-10-CM | POA: Diagnosis not present

## 2021-11-01 DIAGNOSIS — M150 Primary generalized (osteo)arthritis: Secondary | ICD-10-CM

## 2021-11-01 DIAGNOSIS — I130 Hypertensive heart and chronic kidney disease with heart failure and stage 1 through stage 4 chronic kidney disease, or unspecified chronic kidney disease: Secondary | ICD-10-CM | POA: Diagnosis not present

## 2021-11-01 DIAGNOSIS — F250 Schizoaffective disorder, bipolar type: Secondary | ICD-10-CM | POA: Diagnosis not present

## 2021-11-01 DIAGNOSIS — E1122 Type 2 diabetes mellitus with diabetic chronic kidney disease: Secondary | ICD-10-CM | POA: Diagnosis not present

## 2021-11-01 NOTE — Telephone Encounter (Signed)
Attempted to call pt but unable to reach. Left message for her to return call. 

## 2021-11-01 NOTE — Telephone Encounter (Signed)
They do not bill insurance so basically its a pay out of pocket company

## 2021-11-01 NOTE — Telephone Encounter (Signed)
Received a message from pt's PCP office stating that pt had called them when she meant to call our office. Stated that I would try to reach back out to pt. Tried to call pt but unable to reach and unable to leave VM. Will try to call back later.

## 2021-11-01 NOTE — Telephone Encounter (Signed)
Called patient and she states that he current cpap machine is broken and no longer blowing anything out. She states that [place] told her she owns her machine and needs a new one.   Sir is it ok for me to send new order in for a new cpap machine with settings   Auto cpap 5-10 (as before)  Please advise sir  Thank you

## 2021-11-01 NOTE — Telephone Encounter (Signed)
Can we call back and see what she needs

## 2021-11-01 NOTE — Telephone Encounter (Signed)
Pt called asking if you can please call her?    I found a DME in [place] that may help her   [employer] [place] [phone]

## 2021-11-01 NOTE — Telephone Encounter (Signed)
Patient called in requesting call back after lunch but before 5

## 2021-11-01 NOTE — Progress Notes (Signed)
Patient does not know why she is here.  She did not bring [place].  Says she has had it for a few months.  Does not use a phone, so will need a reader.  Says there is no reader at home.  She will test her blood sugar using a meter, but says she does not like to do this.   Says is taking her insulin, "most of the time".  Some times she eats out and does not take her Novolog until she gets home. Reports taking her Tyler Aas every night- 15u and says does not forget this, but admits to forgetting Novolog, expecially when she goes out to eat (several times per week) and does not take this when she gets home.  She admits that she rarely takes Novlog because she feels that she does not need this. She was given a Dexcom G7 with reader, and shown how to use this.  Her blood sugar at this time was 187.  Pt. Very shocked at this.  She will look at home to see if she has a reader and call me for training on this.   She was shown the CeQur device for giving Novolog insulin, which may help her with taking this when she goes out, as well as if she can see what her blood sugars are doing before meals.  I am not sure she is motivated enough to even scan her Elenor Legato to see what her blood sugars are doing before the meals.  I will contact her next week to see how she is doing, and if she prefers this CGM to what was ordered for her, as well as if she was given a reader.   Discussed importance of taking Novolog to prevent the blood sugars from going high after eating.  I will review with her next week to see if they have gone high, and if she is taking her insulin.  Pt. Very confused today, and not sure how much of this discussion she will retain.

## 2021-11-01 NOTE — Telephone Encounter (Signed)
FAXED A NEW ORDER TO [facility] dme to fax [phone] facesheet, demographics and order

## 2021-11-02 ENCOUNTER — Inpatient Hospital Stay: Payer: Medicare HMO

## 2021-11-02 VITALS — BP 141/72 | HR 61 | Temp 96.7°F | Resp 18

## 2021-11-02 DIAGNOSIS — K219 Gastro-esophageal reflux disease without esophagitis: Secondary | ICD-10-CM | POA: Diagnosis not present

## 2021-11-02 DIAGNOSIS — D638 Anemia in other chronic diseases classified elsewhere: Secondary | ICD-10-CM

## 2021-11-02 DIAGNOSIS — R5383 Other fatigue: Secondary | ICD-10-CM | POA: Diagnosis not present

## 2021-11-02 DIAGNOSIS — D509 Iron deficiency anemia, unspecified: Secondary | ICD-10-CM | POA: Diagnosis not present

## 2021-11-02 DIAGNOSIS — E785 Hyperlipidemia, unspecified: Secondary | ICD-10-CM | POA: Diagnosis not present

## 2021-11-02 DIAGNOSIS — D631 Anemia in chronic kidney disease: Secondary | ICD-10-CM

## 2021-11-02 DIAGNOSIS — Z79899 Other long term (current) drug therapy: Secondary | ICD-10-CM | POA: Diagnosis not present

## 2021-11-02 DIAGNOSIS — J449 Chronic obstructive pulmonary disease, unspecified: Secondary | ICD-10-CM | POA: Diagnosis not present

## 2021-11-02 DIAGNOSIS — F5089 Other specified eating disorder: Secondary | ICD-10-CM | POA: Diagnosis not present

## 2021-11-02 DIAGNOSIS — M797 Fibromyalgia: Secondary | ICD-10-CM | POA: Diagnosis not present

## 2021-11-02 DIAGNOSIS — R0602 Shortness of breath: Secondary | ICD-10-CM | POA: Diagnosis not present

## 2021-11-02 LAB — CBC
HCT: 34.2 % — ABNORMAL LOW (ref 36.0–46.0)
Hemoglobin: 9.6 g/dL — ABNORMAL LOW (ref 12.0–15.0)
MCH: 22.6 pg — ABNORMAL LOW (ref 26.0–34.0)
MCHC: 28.1 g/dL — ABNORMAL LOW (ref 30.0–36.0)
MCV: 80.5 fL (ref 80.0–100.0)
Platelets: 234 10*3/uL (ref 150–400)
RBC: 4.25 MIL/uL (ref 3.87–5.11)
RDW: 14.6 % (ref 11.5–15.5)
WBC: 5.3 10*3/uL (ref 4.0–10.5)
nRBC: 0 % (ref 0.0–0.2)

## 2021-11-02 MED ORDER — EPOETIN ALFA 10000 UNIT/ML IJ SOLN
10000.0000 [IU] | Freq: Once | INTRAMUSCULAR | Status: AC
  Administered 2021-11-02: 10000 [IU] via SUBCUTANEOUS
  Filled 2021-11-02: qty 1

## 2021-11-02 NOTE — Telephone Encounter (Signed)
Yes please order replacement for old, broken CPAP machine, mask of choice, humidifier, supplies, and install AirView/card

## 2021-11-02 NOTE — Progress Notes (Signed)
Patient presents today for Retacrit injection. Hemoglobin reviewed prior to administration. VSS. Injection tolerated without incident or complaint. See MAR for details. Patient stable during and after injection.  Patient discharged in satisfactory condition with no s/s of distress noted.    

## 2021-11-02 NOTE — Patient Instructions (Signed)
[facility]  Discharge Instructions: Thank you for choosing [facility] to provide your oncology and hematology care.  If you have a lab appointment with the [facility], please come in thru the Main Entrance and check in at the main information desk.  Wear comfortable clothing and clothing appropriate for easy access to any Portacath or PICC line.   We strive to give you quality time with your provider. You may need to reschedule your appointment if you arrive late (15 or more minutes).  Arriving late affects you and other patients whose appointments are after yours.  Also, if you miss three or more appointments without notifying the office, you may be dismissed from the clinic at the provider's discretion.      For prescription refill requests, have your pharmacy contact our office and allow 72 hours for refills to be completed.    Today you received the following chemotherapy and/or immunotherapy agents retacrit Epoetin Alfa Injection What is this medication? EPOETIN ALFA (e POE e tin AL fa) treats low levels of red blood cells (anemia) caused by kidney disease, chemotherapy, or HIV medications. It can also be used in people who are at risk for blood loss during surgery. It works by Building control surveyor make more red blood cells, which reduces the need for blood transfusions. This medicine may be used for other purposes; ask your health care provider or pharmacist if you have questions. COMMON BRAND NAME(S): Epogen, Procrit, Retacrit What should I tell my care team before I take this medication? They need to know if you have any of these conditions: Blood clots Cancer Heart disease High blood pressure On dialysis Seizures Stroke An unusual or allergic reaction to epoetin alfa, albumin, benzyl alcohol, other medications, foods, dyes, or preservatives Pregnant or trying to get pregnant Breast-feeding How should I use this medication? This medication is  injected into a vein or under the skin. It is usually given by your care team in a hospital or clinic setting. It may also be given at home. If you get this medication at home, you will be taught how to prepare and give it. Use exactly as directed. Take it as directed on the prescription label at the same time every day. Keep taking it unless your care team tells you to stop. It is important that you put your used needles and syringes in a special sharps container. Do not put them in a trash can. If you do not have a sharps container, call your pharmacist or care team to get one. A special MedGuide will be given to you by the pharmacist with each prescription and refill. Be sure to read this information carefully each time. Talk to your care team about the use of this medication in children. While this medication may be used in children as young as 41 month of age for selected conditions, precautions do apply. Overdosage: If you think you have taken too much of this medicine contact a poison control center or emergency room at once. NOTE: This medicine is only for you. Do not share this medicine with others. What if I miss a dose? If you miss a dose, take it as soon as you can. If it is almost time for your next dose, take only that dose. Do not take double or extra doses. What may interact with this medication? Darbepoetin alfa Methoxy polyethylene glycol-epoetin beta This list may not describe all possible interactions. Give your health care provider a  list of all the medicines, herbs, non-prescription drugs, or dietary supplements you use. Also tell them if you smoke, drink alcohol, or use illegal drugs. Some items may interact with your medicine. What should I watch for while using this medication? Visit your care team for regular checks on your progress. Check your blood pressure as directed. Know what your blood pressure should be and when to contact your care team. Your condition will be  monitored carefully while you are receiving this medication. You may need blood work while taking this medication. What side effects may I notice from receiving this medication? Side effects that you should report to your care team as soon as possible: Allergic reactions--skin rash, itching, hives, swelling of the face, lips, tongue, or throat Blood clot--pain, swelling, or warmth in the leg, shortness of breath, chest pain Heart attack--pain or tightness in the chest, shoulders, arms, or jaw, nausea, shortness of breath, cold or clammy skin, feeling faint or lightheaded Increase in blood pressure Rash, fever, and swollen lymph nodes Redness, blistering, peeling, or loosening of the skin, including inside the mouth Seizures Stroke--sudden numbness or weakness of the face, arm, or leg, trouble speaking, confusion, trouble walking, loss of balance or coordination, dizziness, severe headache, change in vision Side effects that usually do not require medical attention (report to your care team if they continue or are bothersome): Bone, joint, or muscle pain Cough Headache Nausea Pain, redness, or irritation at injection site This list may not describe all possible side effects. Call your doctor for medical advice about side effects. You may report side effects to FDA at 1-800-FDA-1088. Where should I keep my medication? Keep out of the reach of children and pets. Store in a refrigerator. Do not freeze. Do not shake. Protect from light. Keep this medication in the original container until you are ready to take it. See product for storage information. Get rid of any unused medication after the expiration date. To get rid of medications that are no longer needed or have expired: Take the medication to a medication take-back program. Check with your pharmacy or law enforcement to find a location. If you cannot return the medication, ask your pharmacist or care team how to get rid of the medication  safely. NOTE: This sheet is a summary. It may not cover all possible information. If you have questions about this medicine, talk to your doctor, pharmacist, or health care provider.  2023 Elsevier/Gold Standard (2021-06-09 00:00:00)       To help prevent nausea and vomiting after your treatment, we encourage you to take your nausea medication as directed.  BELOW ARE SYMPTOMS THAT SHOULD BE REPORTED IMMEDIATELY: *FEVER GREATER THAN 100.4 F (38 C) OR HIGHER *CHILLS OR SWEATING *NAUSEA AND VOMITING THAT IS NOT CONTROLLED WITH YOUR NAUSEA MEDICATION *UNUSUAL SHORTNESS OF BREATH *UNUSUAL BRUISING OR BLEEDING *URINARY PROBLEMS (pain or burning when urinating, or frequent urination) *BOWEL PROBLEMS (unusual diarrhea, constipation, pain near the anus) TENDERNESS IN MOUTH AND THROAT WITH OR WITHOUT PRESENCE OF ULCERS (sore throat, sores in mouth, or a toothache) UNUSUAL RASH, SWELLING OR PAIN  UNUSUAL VAGINAL DISCHARGE OR ITCHING   Items with * indicate a potential emergency and should be followed up as soon as possible or go to the Emergency Department if any problems should occur.  Please show the CHEMOTHERAPY ALERT CARD or IMMUNOTHERAPY ALERT CARD at check-in to the Emergency Department and triage nurse.  Should you have questions after your visit or need to cancel or reschedule your  appointment, please contact [facility] [phone]  and follow the prompts.  Office hours are 8:00 a.m. to 4:30 p.m. Monday - Friday. Please note that voicemails left after 4:00 p.m. may not be returned until the following business day.  We are closed weekends and major holidays. You have access to a nurse at all times for urgent questions. Please call the main number to the clinic [phone] and follow the prompts.  For any non-urgent questions, you may also contact your provider using MyChart. We now offer e-Visits for anyone 36 and older to request care online for non-urgent symptoms.  For details visit [email or web address].   Also download the MyChart app! Go to the app store, search "MyChart", open the app, select [facility], and log in with your MyChart username and password.  Masks are optional in the cancer centers. If you would like for your care team to wear a mask while they are taking care of you, please let them know. You may have one support person who is at least 71 years old accompany you for your appointments.

## 2021-11-02 NOTE — Telephone Encounter (Signed)
Called pt and she is aware that a walker rx was sent to [facility]

## 2021-11-02 NOTE — Telephone Encounter (Signed)
Called and left voicemail for patient to give her an update that I was sending over new order for a new cpap machine for her as discussed yesterday. Told her I was sending it to [facility].   Placed order for new cpap machine approved by Dr Annamaria Boots. Did confirm settings with Dr Annamaria Boots.   Nothing further needed

## 2021-11-04 ENCOUNTER — Other Ambulatory Visit: Payer: Self-pay | Admitting: Physician Assistant

## 2021-11-05 NOTE — Patient Instructions (Addendum)
Look at reader before all meals and 2 hours after eating.   Take novolog insulin before all meals -14u for smaller meals and 16u for larger meals, as directed by [name] Look into box with your Elenor Legato to see if you have a reader.

## 2021-11-06 ENCOUNTER — Other Ambulatory Visit: Payer: Self-pay | Admitting: Family Medicine

## 2021-11-08 ENCOUNTER — Encounter: Payer: Self-pay | Admitting: Family Medicine

## 2021-11-08 ENCOUNTER — Ambulatory Visit (INDEPENDENT_AMBULATORY_CARE_PROVIDER_SITE_OTHER): Payer: Medicare HMO | Admitting: Family Medicine

## 2021-11-08 DIAGNOSIS — R51 Headache with orthostatic component, not elsewhere classified: Secondary | ICD-10-CM | POA: Diagnosis not present

## 2021-11-08 DIAGNOSIS — G8929 Other chronic pain: Secondary | ICD-10-CM

## 2021-11-08 DIAGNOSIS — M5090 Cervical disc disorder, unspecified, unspecified cervical region: Secondary | ICD-10-CM

## 2021-11-08 DIAGNOSIS — M4716 Other spondylosis with myelopathy, lumbar region: Secondary | ICD-10-CM | POA: Diagnosis not present

## 2021-11-08 NOTE — Patient Instructions (Signed)
F/U END SEPTEMBER AS BEFORE, CALL IF YOU NEED ME SOONER  HEADACHE AND LEG PAIN ARE COMING FROM SEVERE ARTHRITIS IN YOUR SPINE  I RECOMMEND YOU DISCUSS MANAGEMENT OPTIONS WITH DR RAMOS  NURSE WILL MAKE  ARRANGEMENT FOR YOUR FLU VACCINE  NEED EYE EXAM

## 2021-11-08 NOTE — Progress Notes (Signed)
Virtual Visit via Telephone Note  I connected with [name] on 11/08/2021 at  3:40 PM EDT by telephone and verified that I am speaking with the correct person using two identifiers.  Location: Patient: home Provider: office   I discussed the limitations, risks, security and privacy concerns of performing an evaluation and management service by telephone and the availability of in person appointments. I also discussed with the patient that there may be a patient responsible charge related to this service. The patient expressed understanding and agreed to proceed.   History of Present Illness: 1 week h/o increased low back and RLE pain , no inciting trauma 1 month h/o posterior headache , no inciting trauma, no new neurologic deficits   Observations/Objective: There were no vitals taken for this visit. Good communication with no confusion and intact memory. 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 Treated by pain management and advised her to contact them for best management option  Follow Up Instructions:    I discussed the assessment and treatment plan with the patient. The patient was provided an opportunity to ask questions and all were answered. The patient agreed with the plan and demonstrated an understanding of the instructions.   The patient was advised to call back or seek an in-person evaluation if the symptoms worsen or if the condition fails to improve as anticipated.  I provided 7 minutes of non-face-to-face time during this encounter.   Tula Nakayama, MD

## 2021-11-09 ENCOUNTER — Telehealth: Payer: Self-pay | Admitting: Family Medicine

## 2021-11-09 ENCOUNTER — Other Ambulatory Visit: Payer: Self-pay | Admitting: Internal Medicine

## 2021-11-09 ENCOUNTER — Emergency Department (HOSPITAL_COMMUNITY): Payer: Medicare HMO

## 2021-11-09 ENCOUNTER — Other Ambulatory Visit: Payer: Self-pay

## 2021-11-09 ENCOUNTER — Emergency Department (HOSPITAL_COMMUNITY)
Admission: EM | Admit: 2021-11-09 | Discharge: 2021-11-09 | Disposition: A | Discharge status: Home / Self Care | Payer: Medicare HMO | Attending: Emergency Medicine | Admitting: Emergency Medicine

## 2021-11-09 DIAGNOSIS — I700 Atherosclerosis of aorta: Secondary | ICD-10-CM | POA: Diagnosis not present

## 2021-11-09 DIAGNOSIS — I959 Hypotension, unspecified: Secondary | ICD-10-CM | POA: Diagnosis not present

## 2021-11-09 DIAGNOSIS — R69 Illness, unspecified: Secondary | ICD-10-CM | POA: Diagnosis not present

## 2021-11-09 DIAGNOSIS — W19XXXA Unspecified fall, initial encounter: Secondary | ICD-10-CM

## 2021-11-09 DIAGNOSIS — G8929 Other chronic pain: Secondary | ICD-10-CM | POA: Insufficient documentation

## 2021-11-09 DIAGNOSIS — Z20822 Contact with and (suspected) exposure to covid-19: Secondary | ICD-10-CM | POA: Insufficient documentation

## 2021-11-09 DIAGNOSIS — R5381 Other malaise: Secondary | ICD-10-CM | POA: Diagnosis not present

## 2021-11-09 DIAGNOSIS — M549 Dorsalgia, unspecified: Secondary | ICD-10-CM | POA: Diagnosis not present

## 2021-11-09 DIAGNOSIS — M545 Low back pain, unspecified: Secondary | ICD-10-CM | POA: Insufficient documentation

## 2021-11-09 DIAGNOSIS — I517 Cardiomegaly: Secondary | ICD-10-CM | POA: Diagnosis not present

## 2021-11-09 DIAGNOSIS — R102 Pelvic and perineal pain: Secondary | ICD-10-CM | POA: Diagnosis not present

## 2021-11-09 DIAGNOSIS — M47812 Spondylosis without myelopathy or radiculopathy, cervical region: Secondary | ICD-10-CM | POA: Diagnosis not present

## 2021-11-09 DIAGNOSIS — I10 Essential (primary) hypertension: Secondary | ICD-10-CM | POA: Diagnosis not present

## 2021-11-09 DIAGNOSIS — M2669 Other specified disorders of temporomandibular joint: Secondary | ICD-10-CM | POA: Diagnosis not present

## 2021-11-09 DIAGNOSIS — Z043 Encounter for examination and observation following other accident: Secondary | ICD-10-CM | POA: Diagnosis not present

## 2021-11-09 DIAGNOSIS — G9389 Other specified disorders of brain: Secondary | ICD-10-CM | POA: Diagnosis not present

## 2021-11-09 DIAGNOSIS — R109 Unspecified abdominal pain: Secondary | ICD-10-CM | POA: Diagnosis not present

## 2021-11-09 DIAGNOSIS — S0990XA Unspecified injury of head, initial encounter: Secondary | ICD-10-CM | POA: Diagnosis not present

## 2021-11-09 DIAGNOSIS — M5459 Other low back pain: Secondary | ICD-10-CM | POA: Diagnosis not present

## 2021-11-09 LAB — RAPID URINE DRUG SCREEN, HOSP PERFORMED
Amphetamines: NOT DETECTED
Barbiturates: NOT DETECTED
Benzodiazepines: NOT DETECTED
Cocaine: NOT DETECTED
Opiates: NOT DETECTED
Tetrahydrocannabinol: NOT DETECTED

## 2021-11-09 LAB — COMPREHENSIVE METABOLIC PANEL
ALT: 20 U/L (ref 0–44)
AST: 22 U/L (ref 15–41)
Albumin: 4.1 g/dL (ref 3.5–5.0)
Alkaline Phosphatase: 67 U/L (ref 38–126)
Anion gap: 10 (ref 5–15)
BUN: 21 mg/dL (ref 8–23)
CO2: 24 mmol/L (ref 22–32)
Calcium: 9.2 mg/dL (ref 8.9–10.3)
Chloride: 107 mmol/L (ref 98–111)
Creatinine, Ser: 0.96 mg/dL (ref 0.44–1.00)
GFR, Estimated: >60 mL/min (ref >60)
Glucose, Bld: 155 mg/dL — ABNORMAL HIGH (ref 70–99)
Potassium: 3.6 mmol/L (ref 3.5–5.1)
Sodium: 141 mmol/L (ref 135–145)
Total Bilirubin: 0.6 mg/dL (ref 0.3–1.2)
Total Protein: 7.4 g/dL (ref 6.5–8.1)

## 2021-11-09 LAB — BLOOD GAS, VENOUS
Acid-Base Excess: 3.7 mmol/L — ABNORMAL HIGH (ref 0.0–2.0)
Bicarbonate: 29.7 mmol/L — ABNORMAL HIGH (ref 20.0–28.0)
Drawn by: 6000
O2 Saturation: 69.9 %
Patient temperature: 36.5
pCO2, Ven: 48 mmHg (ref 44–60)
pH, Ven: 7.4 (ref 7.25–7.43)
pO2, Ven: 40 mmHg (ref 32–45)

## 2021-11-09 LAB — URINALYSIS, ROUTINE W REFLEX MICROSCOPIC
Bilirubin Urine: NEGATIVE
Glucose, UA: NEGATIVE mg/dL
Hgb urine dipstick: NEGATIVE
Ketones, ur: NEGATIVE mg/dL
Leukocytes,Ua: NEGATIVE
Nitrite: NEGATIVE
Protein, ur: NEGATIVE mg/dL
Specific Gravity, Urine: 1.008 (ref 1.005–1.030)
pH: 6 (ref 5.0–8.0)

## 2021-11-09 LAB — CBC
HCT: 38.6 % (ref 36.0–46.0)
Hemoglobin: 10.9 g/dL — ABNORMAL LOW (ref 12.0–15.0)
MCH: 22.5 pg — ABNORMAL LOW (ref 26.0–34.0)
MCHC: 28.2 g/dL — ABNORMAL LOW (ref 30.0–36.0)
MCV: 79.8 fL — ABNORMAL LOW (ref 80.0–100.0)
Platelets: 223 10*3/uL (ref 150–400)
RBC: 4.84 MIL/uL (ref 3.87–5.11)
RDW: 14.4 % (ref 11.5–15.5)
WBC: 6.5 10*3/uL (ref 4.0–10.5)
nRBC: 0 % (ref 0.0–0.2)

## 2021-11-09 LAB — RESP PANEL BY RT-PCR (FLU A&B, COVID) ARPGX2
Influenza A by PCR: NEGATIVE
Influenza B by PCR: NEGATIVE
SARS Coronavirus 2 by RT PCR: NEGATIVE

## 2021-11-09 LAB — ETHANOL: Alcohol, Ethyl (B): <10 mg/dL (ref <10)

## 2021-11-09 MED ORDER — LIDOCAINE 5 % EX PTCH
1.0000 | MEDICATED_PATCH | CUTANEOUS | Status: DC
  Administered 2021-11-09: 1 via TRANSDERMAL
  Filled 2021-11-09: qty 1

## 2021-11-09 MED ORDER — IOHEXOL 300 MG/ML  SOLN
100.0000 mL | Freq: Once | INTRAMUSCULAR | Status: AC | PRN
  Administered 2021-11-09: 100 mL via INTRAVENOUS

## 2021-11-09 MED ORDER — OXYCODONE-ACETAMINOPHEN 5-325 MG PO TABS
1.0000 | ORAL_TABLET | Freq: Once | ORAL | Status: AC
  Administered 2021-11-09: 1 via ORAL
  Filled 2021-11-09: qty 1

## 2021-11-09 MED ORDER — ACETAMINOPHEN 325 MG PO TABS
650.0000 mg | ORAL_TABLET | Freq: Once | ORAL | Status: AC
  Administered 2021-11-09: 650 mg via ORAL
  Filled 2021-11-09: qty 2

## 2021-11-09 MED ORDER — KETOROLAC TROMETHAMINE 15 MG/ML IJ SOLN
15.0000 mg | Freq: Once | INTRAMUSCULAR | Status: AC
  Administered 2021-11-09: 15 mg via INTRAVENOUS
  Filled 2021-11-09: qty 1

## 2021-11-09 NOTE — Discharge Instructions (Signed)
You are seen in the emergency department for evaluation of back pain from a fall.  You had lab work x-rays and a CAT scan that did not show any obvious traumatic injuries.  Please continue your regular pain medication and follow-up with your primary care doctor.  Return to the emergency department if any worsening or concerning symptoms.

## 2021-11-09 NOTE — ED Provider Notes (Signed)
[facility] Provider Note   CSN: [id number] Arrival date & time: 11/09/21  1100     History  Chief Complaint  Patient presents with   Back Pain   Fall    [name] is a 71 y.o. female.  HPI Patient presents after a fall.  Medical history includes OSA, HLD, HTN, GERD, DM 2, IBS, mood disorder, anemia, seizure disorder, gastroparesis, upper thyroidism, CHF, anxiety, arthritis, and osteoporosis.  She describes her fall, which occurred shortly prior to arrival, as a sliding down the front of a chair and landing on the floor.  She has had chronic pain in her right lower back that radiates down her right leg.  She states that this has worsened since yesterday.  She has a pain in the occiput of her head which also preceded the fall.  At baseline, she is able to ambulate with a walker.    Home Medications Prior to Admission medications   Medication Sig Start Date End Date Taking? Authorizing Provider  acetaminophen (TYLENOL) 325 MG tablet Take 650 mg by mouth every 6 (six) hours as needed.   Yes [provider]  albuterol (VENTOLIN HFA) 108 (90 Base) MCG/ACT inhaler INHALE 1 TO 2 PUFFS EVERY 6 HOURS AS NEEDED FOR WHEEZING, SHORTNESS OF BREATH Patient taking differently: Inhale 1-2 puffs into the lungs every 6 (six) hours as needed for wheezing or shortness of breath. 06/24/21  Yes Young, Tarri Fuller D, MD  alendronate (FOSAMAX) 70 MG tablet TAKE 1 TABLET EVERY 7 DAYS ON AN EMPTY STOMACH WITH A FULL GLASS OF WATER Patient taking differently: Take 70 mg by mouth once a week. 11/07/21  Yes [name], MD  amLODipine (NORVASC) 10 MG tablet TAKE 1 TABLET EVERY DAY Patient taking differently: Take 10 mg by mouth daily. 06/06/21  Yes Imogene Burn, PA-C  ascorbic acid (VITAMIN C) 500 MG tablet Take 500 mg by mouth daily.   Yes [provider]  aspirin EC 81 MG tablet Take 1 tablet (81 mg total) by mouth daily with breakfast. 12/08/18  Yes Emokpae,  Courage, MD  buPROPion (WELLBUTRIN XL) 150 MG 24 hr tablet Take 1 tablet (150 mg total) by mouth every morning. 10/12/21 10/12/22 Yes Cloria Spring, MD  Calcium Carb-Cholecalciferol (CALTRATE 600+D3 SOFT) 600-20 MG-MCG CHEW Chew 1 tablet by mouth daily at 12 noon.   Yes [provider]  cetirizine (ZYRTEC) 10 MG tablet Take 1 tablet (10 mg total) by mouth daily. 04/25/21  Yes Paseda, Dewaine Conger, FNP  Cholecalciferol (D3 PO) Take 1 tablet by mouth daily.   Yes [provider]  Clobetasol Prop Emollient Base (CLOBETASOL PROPIONATE E) 0.05 % emollient cream Apply to affected area qd Patient taking differently: Apply 1 Application topically daily. 01/24/21  Yes Lavonna Monarch, MD  diclofenac Sodium (VOLTAREN) 1 % GEL APPLY 2 GRAMS TOPICALLY FOUR TIMES DAILY. Patient taking differently: Apply 2 g topically 4 (four) times daily. 11/04/21  Yes Esterwood, Amy S, PA-C  Docusate Sodium (DSS) 100 MG CAPS Take 1 tablet by mouth in the morning and at bedtime.   Yes [provider]  ezetimibe (ZETIA) 10 MG tablet TAKE 1 TABLET EVERY DAY Patient taking differently: Take 10 mg by mouth daily. 06/22/21  Yes [name], MD  insulin aspart (NOVOLOG FLEXPEN) 100 UNIT/ML FlexPen Inject 5 Units into the skin 3 (three) times daily with meals. Give if eats 50% or more of meal. Patient taking differently: Inject 14 Units into the  skin 3 (three) times daily with meals. Give if eats 50% or more of meal. 09/17/21  Yes Johnson, Clanford L, MD  insulin glargine, 2 Unit Dial, (TOUJEO MAX SOLOSTAR) 300 UNIT/ML Solostar Pen Inject 20-25 Units into the skin daily. 09/29/21  Yes Philemon Kingdom, MD  lactobacillus acidophilus (BACID) TABS tablet Take 2 tablets by mouth 3 (three) times daily.   Yes [provider]  lamoTRIgine (LAMICTAL) 100 MG tablet Take 1 tablet (100 mg total) by mouth 2 (two) times daily. 10/12/21  Yes Cloria Spring, MD  levothyroxine (SYNTHROID) 50 MCG tablet TAKE 1 TABLET  EVERY DAY FOR 6 DAYS PER WEEK, 1/2 TABLET 1 DAY PER WEEK 07/07/21  Yes Philemon Kingdom, MD  LORazepam (ATIVAN) 0.5 MG tablet Take 1 tablet (0.5 mg total) by mouth 2 (two) times daily. 10/12/21  Yes Cloria Spring, MD  losartan (COZAAR) 50 MG tablet TAKE 1 TABLET EVERY DAY 10/07/21  Yes [name], MD  meclizine (ANTIVERT) 25 MG tablet Take 1 tablet (25 mg total) by mouth 3 (three) times daily as needed for dizziness. 04/28/20  Yes Noreene Larsson, NP  metoprolol tartrate (LOPRESSOR) 50 MG tablet TAKE 1 TABLET TWICE DAILY (NEED MD APPOINTMENT) Patient taking differently: Take 50 mg by mouth 2 (two) times daily. 08/29/21  Yes Strader, [place], PA-C  montelukast (SINGULAIR) 10 MG tablet TAKE 1 TABLET EVERY DAY Patient taking differently: Take 10 mg by mouth at bedtime. 05/20/21  Yes [name], MD  Multiple Vitamins-Minerals (MULTIVITAMIN GUMMIES ADULT PO) Take 1 tablet by mouth daily.   Yes [provider]  MYRBETRIQ 25 MG TB24 tablet TAKE 1 TABLET EVERY DAY 10/07/21  Yes [name], MD  nystatin (MYCOSTATIN/NYSTOP) powder APPLY TO AFFECTED AREA 4 TIMES DAILY. Patient taking differently: Apply 1 Application topically in the morning, at noon, in the evening, and at bedtime. 09/12/21  Yes [name], MD  Omega-3 Fatty Acids (FISH OIL PO) Take 1 capsule by mouth daily.   Yes [provider]  ondansetron (ZOFRAN) 4 MG tablet Take 1 tablet (4 mg total) by mouth every 8 (eight) hours as needed for nausea or vomiting. 07/19/21  Yes Alvira Monday, FNP  oxyCODONE-acetaminophen (PERCOCET) 10-325 MG tablet Take 1 tablet by mouth every 8 (eight) hours as needed for pain. 11/01/20  Yes [provider]  pregabalin (LYRICA) 75 MG capsule Take 1 capsule (75 mg total) by mouth daily. 10/20/16  Yes Rexene Alberts, MD  RABEprazole (ACIPHEX) 20 MG tablet TAKE 1 TABLET TWICE DAILY 11/09/21  Yes Pyrtle, Lajuan Lines, MD  RESTASIS 0.05 % ophthalmic emulsion Place 2 drops into  both eyes daily. 07/29/20  Yes [provider]  risperiDONE (RISPERDAL) 0.5 MG tablet Take 1 tablet (0.5 mg total) by mouth at bedtime. 10/12/21  Yes Cloria Spring, MD  rosuvastatin (CRESTOR) 5 MG tablet TAKE 1 TABLET AT BEDTIME. 11/07/21  Yes [name], MD  sertraline (ZOLOFT) 100 MG tablet Take 1 tablet (100 mg total) by mouth every morning. 10/12/21  Yes Cloria Spring, MD  traZODone (DESYREL) 150 MG tablet Take 1 tablet (150 mg total) by mouth at bedtime. 10/12/21  Yes Cloria Spring, MD  vitamin E 180 MG (400 UNITS) capsule Take 400 Units by mouth daily.   Yes [provider]  ACCU-CHEK GUIDE test strip TEST BLOOD SUGAR FOUR TIMES DAILY  AND AS NEEDED 05/30/21   Philemon Kingdom, MD  Accu-Chek Softclix Lancets lancets TEST BLOOD SUGAR THREE  TIMES DAILY AS DIRECTED 03/28/21   Philemon Kingdom, MD  Alcohol Swabs (DROPSAFE ALCOHOL PREP) 70 % PADS USE TO CLEAN FINGER PRIOR TO TESTING FOR BLOOD SUGAR  AS DIRECTED 05/20/21   Philemon Kingdom, MD  Blood Glucose Calibration (ACCU-CHEK GUIDE CONTROL) LIQD USE AS DIRECTED 12/13/20   Philemon Kingdom, MD  blood glucose meter kit and supplies Dispense based on patient and insurance preference. Use up to four times daily as directed. (FOR ICD-10 E10.9, E11.9). 06/27/19   Philemon Kingdom, MD  Continuous Blood Gluc Sensor (FREESTYLE LIBRE 14 DAY SENSOR) MISC 1 each by Does not apply route every 14 (fourteen) days. Change every 2 weeks 06/24/19   Philemon Kingdom, MD  DROPLET PEN NEEDLES 31G X 8 MM MISC USE FOR INJECTING INSULIN 4 TIMES DAILY. 09/02/21   Philemon Kingdom, MD  JARDIANCE 10 MG TABS tablet Take 10 mg by mouth daily. Patient not taking: Reported on 11/09/2021 10/31/21   [provider]  Misc. Devices (MATTRESS PAD) MISC FIRM MATTRESS PAD for hospital bed x 1 02/22/21   [name], MD  STIOLTO RESPIMAT 2.5-2.5 MCG/ACT AERS INHALE 2 PUFFS INTO THE LUNGS DAILY. Patient not taking: Reported on 11/09/2021 05/31/20    Parrett, Fonnie Mu, NP      Allergies    Iron, Milk (cow), Penicillins, Phenazopyridine, Phenazopyridine hcl, Cephalexin, Flonase [fluticasone], Milk-related compounds, and Phenazopyridine hcl    Review of Systems   Review of Systems  Constitutional:  Positive for fatigue.  Musculoskeletal:  Positive for arthralgias and back pain.  Neurological:  Positive for headaches.  All other systems reviewed and are negative.   Physical Exam Updated Vital Signs BP (!) 154/65   Pulse 65   Temp 97.7 F (36.5 C) (Oral)   Resp (!) 22   Ht 4' 11" (1.499 m)   Wt 72.6 kg   SpO2 97%   BMI 32.32 kg/m  Physical Exam Vitals and nursing note reviewed.  Constitutional:      General: She is not in acute distress.    Appearance: She is well-developed. She is not toxic-appearing or diaphoretic.     Comments: Somnolent   HENT:     Head: Normocephalic and atraumatic.     Right Ear: External ear normal.     Left Ear: External ear normal.     Nose: Nose normal.     Mouth/Throat:     Mouth: Mucous membranes are moist.     Pharynx: Oropharynx is clear.  Eyes:     Extraocular Movements: Extraocular movements intact.     Conjunctiva/sclera: Conjunctivae normal.  Cardiovascular:     Rate and Rhythm: Normal rate and regular rhythm.     Heart sounds: No murmur heard. Pulmonary:     Effort: Pulmonary effort is normal. No respiratory distress.     Breath sounds: Normal breath sounds. No wheezing, rhonchi or rales.  Chest:     Chest wall: No tenderness.  Abdominal:     General: There is no distension.     Palpations: Abdomen is soft.     Tenderness: There is no abdominal tenderness.  Musculoskeletal:        General: Tenderness (Posterior right flank and hip) present. No swelling.     Cervical back: Normal range of motion and neck supple.     Right lower leg: No edema.     Left lower leg: No edema.     Comments: Active and passive right hip ROM limited by pain  Skin:  General: Skin is warm and  dry.  Neurological:     General: No focal deficit present.     Mental Status: She is alert and oriented to person, place, and time.     Cranial Nerves: No cranial nerve deficit.     Sensory: No sensory deficit.     Motor: No weakness.  Psychiatric:        Mood and Affect: Mood normal.        Behavior: Behavior normal.     ED Results / Procedures / Treatments   Labs (all labs ordered are listed, but only abnormal results are displayed) Labs Reviewed  COMPREHENSIVE METABOLIC PANEL - Abnormal; Notable for the following components:      Result Value   Glucose, Bld 155 (*)    All other components within normal limits  CBC - Abnormal; Notable for the following components:   Hemoglobin 10.9 (*)    MCV 79.8 (*)    MCH 22.5 (*)    MCHC 28.2 (*)    All other components within normal limits  URINALYSIS, ROUTINE W REFLEX MICROSCOPIC - Abnormal; Notable for the following components:   Color, Urine STRAW (*)    All other components within normal limits  BLOOD GAS, VENOUS - Abnormal; Notable for the following components:   Bicarbonate 29.7 (*)    Acid-Base Excess 3.7 (*)    All other components within normal limits  RESP PANEL BY RT-PCR (FLU A&B, COVID) ARPGX2  ETHANOL  RAPID URINE DRUG SCREEN, HOSP PERFORMED    EKG EKG Interpretation  Date/Time:  Wednesday November 09 2021 11:47:20 EDT Ventricular Rate:  68 PR Interval:  194 QRS Duration: 98 QT Interval:  431 QTC Calculation: 459 R Axis:   37 Text Interpretation: Sinus rhythm Confirmed by Nanda Quinton [id number] on 11/10/2021 2:26:25 PM  Radiology No results found.  Procedures Procedures    Medications Ordered in ED Medications  acetaminophen (TYLENOL) tablet 650 mg (650 mg Oral Given 11/09/21 1152)  ketorolac (TORADOL) 15 MG/ML injection 15 mg (15 mg Intravenous Given 11/09/21 1344)  oxyCODONE-acetaminophen (PERCOCET/ROXICET) 5-325 MG per tablet 1 tablet (1 tablet Oral Given 11/09/21 1600)  iohexol (OMNIPAQUE) 300 MG/ML  solution 100 mL (100 mLs Intravenous Contrast Given 11/09/21 1626)    ED Course/ Medical Decision Making/ A&P                           Medical Decision Making Amount and/or Complexity of Data Reviewed Labs: ordered. Radiology: ordered.  Risk OTC drugs. Prescription drug management.   This patient presents to the ED for concern of back pain and right flank pain, this involves an extensive number of treatment options, and is a complaint that carries with it a high risk of complications and morbidity.  The differential diagnosis includes acute injury, acute on chronic sciatica, nephrolithiasis, constipation, colitis, appendicitis   Co morbidities that complicate the patient evaluation  OSA, HLD, HTN, GERD, DM 2, IBS, mood disorder, anemia, seizure disorder, gastroparesis, upper thyroidism, CHF, anxiety, arthritis, and osteoporosis   Additional history obtained:  Additional history obtained from patient's caregiver External records from outside source obtained and reviewed including EMR   Lab Tests:  I Ordered, and personally interpreted labs.  The pertinent results include: Baseline anemia, no leukocytosis, normal kidney function, normal electrolytes, no evidence of UTI or hematuria   Imaging Studies ordered:  I ordered imaging studies including x-ray of chest, right hip, CT of head, C-spine, abdomen pelvis,  lumbar spine I independently visualized and interpreted imaging which showed no acute findings I agree with the radiologist interpretation   Cardiac Monitoring: / EKG:  The patient was maintained on a cardiac monitor.  I personally viewed and interpreted the cardiac monitored which showed an underlying rhythm of: Sinus rhythm  Problem List / ED Course / Critical interventions / Medication management  Patient is a 71 year old female who presents for what she describes as worsened low back pain that radiates down her right leg.  She currently lives at home and has a  caregiver present during the daytime.  Earlier today, she slid out of her chair and ended up on the floor.  She was unable to get up off of the floor due to pain.  Patient is explicit that this was due to pain rather than weakness.  She states that she has had ongoing right-sided back and leg pain over the past several days.  She is prescribed Percocet, which she does take as needed.  She has been taking increasing amounts over the past several days.  On arrival in the ED, patient is mildly somnolent.  No significant tenderness is present in the low back, right leg, or right side of abdomen.  Multimodal pain control was initiated with lidocaine patch, Toradol, and Tylenol.  Patient underwent lab work and imaging studies.  X-ray imaging of right hip did not show any acute injuries.  CT scan of abdomen, pelvis, and lumbar spine was ordered to assess for occult pelvic injury or new lumbar spine injury as well as possible intra-abdominal findings such as nephrolithiasis that may explain her recent worsening symptoms.  On reassessment, patient did have improved symptoms.  Dose of Percocet was ordered for ongoing analgesia.  At time of signout, results of CT imaging are pending.  Care of patient was signed out to oncoming ED provider. I ordered medication including lidocaine patch, Toradol, Tylenol, and Percocet for analgesia Reevaluation of the patient after these medicines showed that the patient improved I have reviewed the patients home medicines and have made adjustments as needed   Social Determinants of Health:  Lives at home with caregiver support         Final Clinical Impression(s) / ED Diagnoses Final diagnoses:  Fall, initial encounter  Chronic bilateral low back pain without sciatica    Rx / DC Orders ED Discharge Orders     None         Godfrey Pick, MD 11/11/21 2018

## 2021-11-09 NOTE — ED Triage Notes (Signed)
Patient presents to ED via [facility], patient is alert and oriented x 4, Patient reprt that she was standing up from her lift chair and slipped down to the floor. c/o pain in lumbar area, pain radiating down right leg and right lower abdomen. Denies hitting her head. Patient has a history of diabetes. EMS obtained CBG 178. No acute distress noted.

## 2021-11-09 NOTE — Telephone Encounter (Signed)
Patient called in requesting a call back from provider.  Patient slid down in there floor this morning trying to get to something in the room . Was unable to get up due to legs weakness, wants a call back as soon as possible.

## 2021-11-09 NOTE — ED Provider Notes (Signed)
Sign-out from Dr. Doren Custard.  71 year old female with chronic back pain here with worsening low back pain radiating into her right leg and right lower quadrant since 2 minimal falls from a seated position.  Plan is to follow-up on CT imaging of lumbar spine and abdomen and pelvis.  Likely can be discharged with outpatient follow-up PCP if no acute findings. Physical Exam  BP (!) 164/65 (BP Location: Left Arm)   Pulse 68   Temp 97.7 F (36.5 C) (Oral)   Resp 18   Ht '4\' 11"'$  (1.499 m)   Wt 72.6 kg   SpO2 97%   BMI 32.32 kg/m   Physical Exam  Procedures  Procedures  ED Course / MDM    Medical Decision Making Amount and/or Complexity of Data Reviewed Labs: ordered. Radiology: ordered.  Risk OTC drugs. Prescription drug management.   Imaging does not show any acute traumatic findings.  Reviewed with patient.  She said she usually uses a walker to get around and her son can come pick her up.  She is comfortable plan for outpatient follow-up.  Nurse ambulated patient with walker.  Says she was fairly unsteady.  Patient states she is ready to go home and she walks better with her own walker at home.  Son is here now and states she is mostly independent at home.  We will put in consult for home health to see if we can get her evaluated at home.        Hayden Rasmussen, MD 11/10/21 [phone]

## 2021-11-09 NOTE — Telephone Encounter (Signed)
Patient is being evaluated in the ED

## 2021-11-09 NOTE — ED Notes (Signed)
Pt ambulated with walker. Gait and balance very unsteady. Pt almost fell backwards x 2 staff caught pt and rebalanced pt. Pt then tipped forward and was unable to catch herself, caught by staff again. Pt son aware of difficulty ambulating

## 2021-11-10 ENCOUNTER — Telehealth: Payer: Self-pay | Admitting: Family Medicine

## 2021-11-10 DIAGNOSIS — E1159 Type 2 diabetes mellitus with other circulatory complications: Secondary | ICD-10-CM

## 2021-11-10 DIAGNOSIS — I509 Heart failure, unspecified: Secondary | ICD-10-CM | POA: Diagnosis not present

## 2021-11-10 DIAGNOSIS — I110 Hypertensive heart disease with heart failure: Secondary | ICD-10-CM

## 2021-11-10 DIAGNOSIS — Z794 Long term (current) use of insulin: Secondary | ICD-10-CM

## 2021-11-10 DIAGNOSIS — J449 Chronic obstructive pulmonary disease, unspecified: Secondary | ICD-10-CM

## 2021-11-10 NOTE — Telephone Encounter (Signed)
Patient called in slipped down in char today,  R leg was weak.  Need walker, a black walker.  Wants a call back

## 2021-11-11 ENCOUNTER — Telehealth: Payer: Self-pay | Admitting: *Deleted

## 2021-11-11 DIAGNOSIS — J449 Chronic obstructive pulmonary disease, unspecified: Secondary | ICD-10-CM | POA: Diagnosis not present

## 2021-11-11 DIAGNOSIS — I10 Essential (primary) hypertension: Secondary | ICD-10-CM | POA: Diagnosis not present

## 2021-11-11 NOTE — Telephone Encounter (Signed)
Called patient and left message for them to return call at the office   

## 2021-11-11 NOTE — Telephone Encounter (Signed)
        Patient  visited [facility]  on 11/09/2021  for treatment   Telephone encounter attempt :  1st  A HIPAA compliant voice message was left requesting a return call.  Instructed patient to call back at [phone].  [facility] [phone] [address] , [place] [address] Email : [email or web address]

## 2021-11-14 ENCOUNTER — Encounter: Payer: Self-pay | Admitting: Family Medicine

## 2021-11-14 NOTE — Assessment & Plan Note (Signed)
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

## 2021-11-14 NOTE — Assessment & Plan Note (Signed)
1 week h/o increased pai radiaiting down leg , chronic condition with current flare. Treated by pain management and advised her to contact them for best management option

## 2021-11-15 ENCOUNTER — Telehealth: Payer: Self-pay | Admitting: *Deleted

## 2021-11-15 DIAGNOSIS — M75100 Unspecified rotator cuff tear or rupture of unspecified shoulder, not specified as traumatic: Secondary | ICD-10-CM | POA: Insufficient documentation

## 2021-11-15 DIAGNOSIS — M19112 Post-traumatic osteoarthritis, left shoulder: Secondary | ICD-10-CM | POA: Diagnosis not present

## 2021-11-15 DIAGNOSIS — M25512 Pain in left shoulder: Secondary | ICD-10-CM | POA: Diagnosis not present

## 2021-11-15 IMAGING — MR MR HEAD WO/W CM
12 series · 48 of 48 positions shown · IV contrast (multihance)
Comparison: MRI of the brain April 18, 2019.

CLINICAL DATA: Brain/CNS neoplasm, surveillance. Follow-up of
treated meningioma.

EXAM:
MRI HEAD WITHOUT AND WITH CONTRAST
TECHNIQUE: Multiplanar, multiecho pulse sequences of the brain and surrounding
structures were obtained without and with intravenous contrast.
CONTRAST:  16mL MULTIHANCE GADOBENATE DIMEGLUMINE 529 MG/ML IV SOLN

[Series 2: FLAIR · sagittal · 3.0mm · 0.75mm/px · 1 of 39 slices shown (1 of 2)]
[im 1/39]
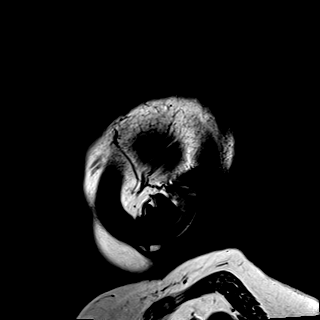

[Series 3: DWI · axial · 3.0mm · 1.50mm/px · z∈[-64,+84]mm · 4 of 78 slices shown (1 of 2)]
[im 1/78]
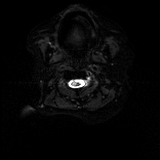
[im 26/78]
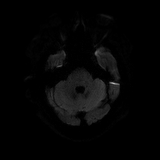
[im 52/78]
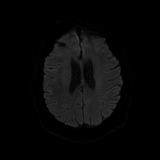
[im 78/78]
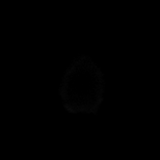

[Series 4: DWI · axial · 3.0mm · 1.50mm/px · z∈[-64,+84]mm · 2 of 39 slices shown (2 of 2)]
[im 1/39]
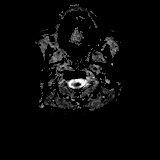
[im 39/39]
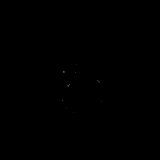

[Series 5: T2 · axial · 5.0mm · 0.57mm/px · 1 of 27 slices shown (1 of 2)]
[im 1/27]
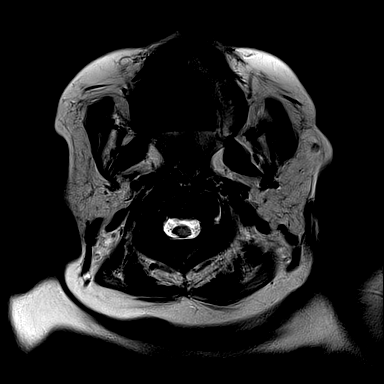

[Series 7: swi_images · axial · 1.5mm · 0.90mm/px · z∈[-52,+89]mm · 5 of 96 slices shown]
[im 1/96]
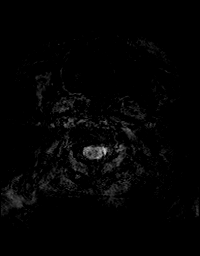
[im 24/96]
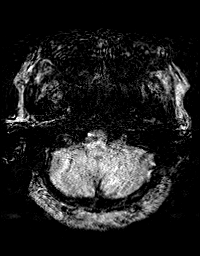
[im 48/96]
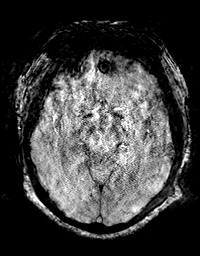
[im 72/96]
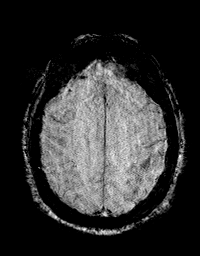
[im 96/96]
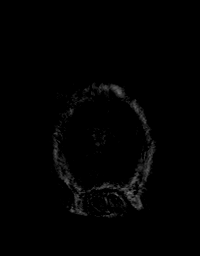

[Series 8: T2 · axial · non-contrast · 1.0mm · 0.86mm/px · z∈[-74,+82]mm · 8 of 160 slices shown (2 of 2)]
[im 1/160]
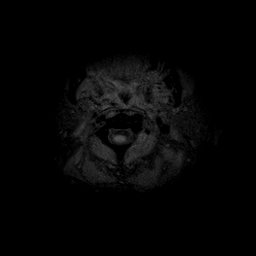
[im 23/160]
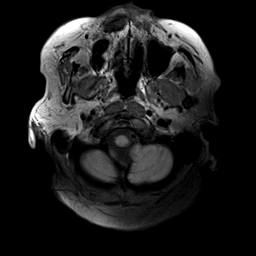
[im 46/160]
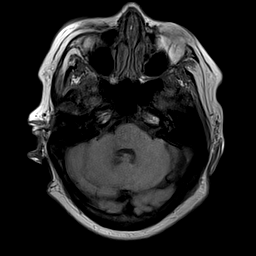
[im 69/160]
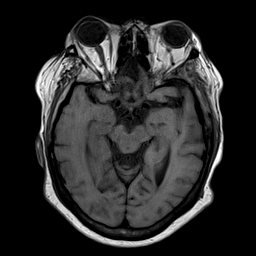
[im 91/160]
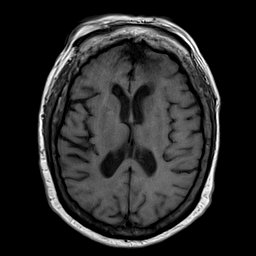
[im 114/160]
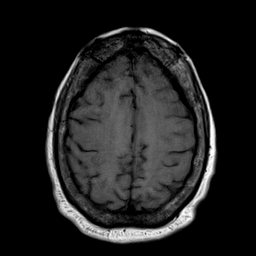
[im 137/160]
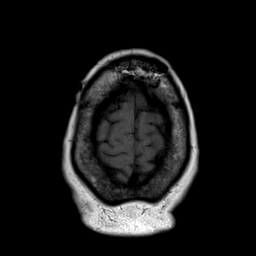
[im 160/160]
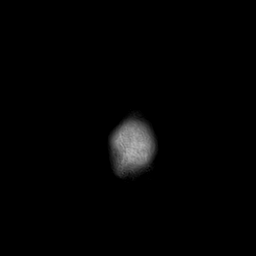

[Series 9: FLAIR · axial · 3.0mm · 0.86mm/px · z∈[-78,+86]mm · 3 of 56 slices shown (2 of 2)]
[im 1/56]
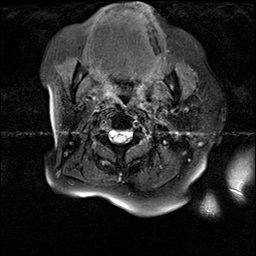
[im 28/56]
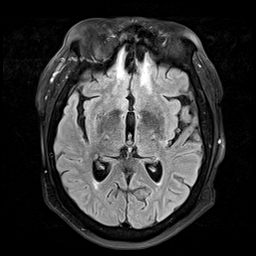
[im 56/56]
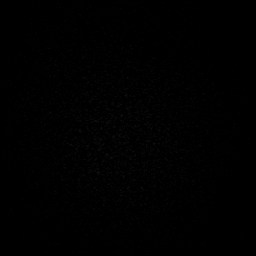

[Series 10: T2 post-contrast · coronal · 3.0mm · 0.57mm/px · 3 of 50 slices shown (1 of 2)]
[im 1/50]
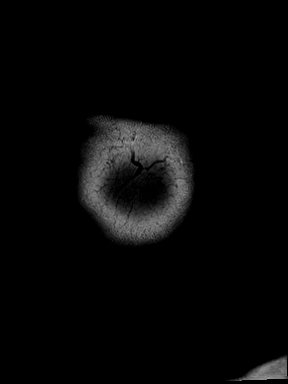
[im 25/50]
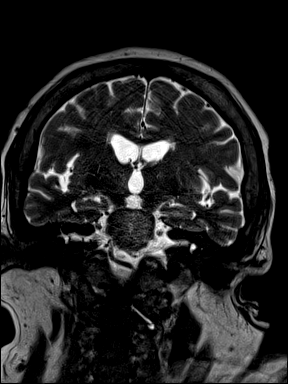
[im 50/50]
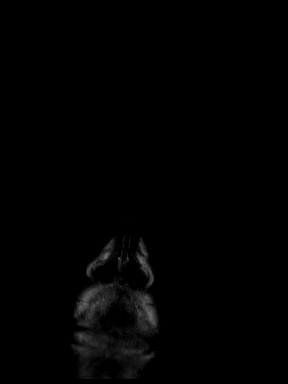

[Series 11: T2 post-contrast · axial · 1.0mm · 0.86mm/px · z∈[-74,+82]mm · 8 of 160 slices shown (2 of 2)]
[im 1/160]
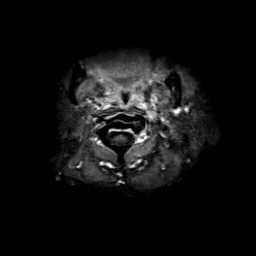
[im 23/160]
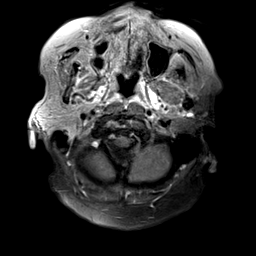
[im 46/160]
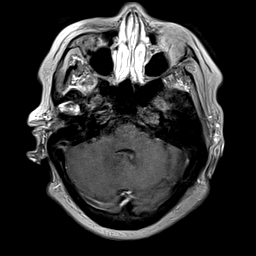
[im 69/160]
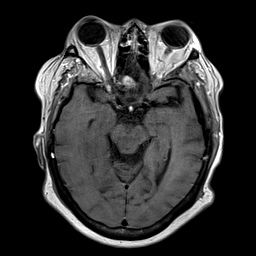
[im 91/160]
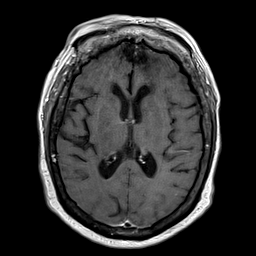
[im 114/160]
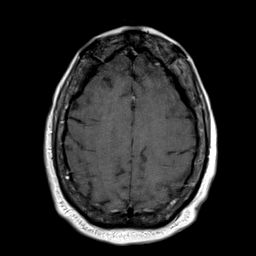
[im 137/160]
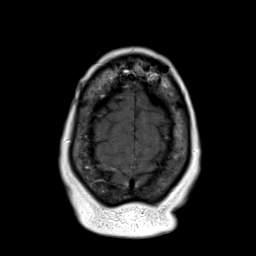
[im 160/160]
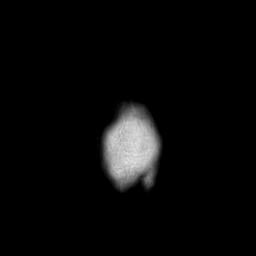

[Series 12: T1 post-contrast · axial · 1.0mm · 0.75mm/px · z∈[-76,+82]mm · 8 of 160 slices shown (1 of 2)]
[im 1/160]
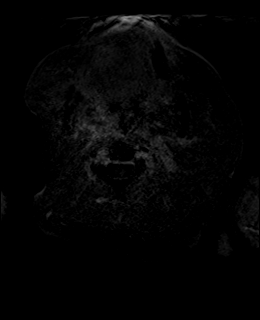
[im 23/160]
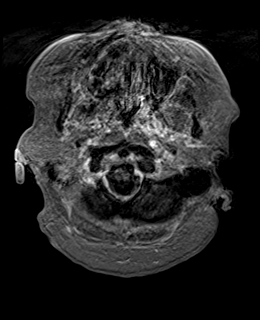
[im 46/160]
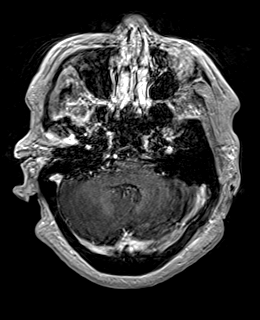
[im 69/160]
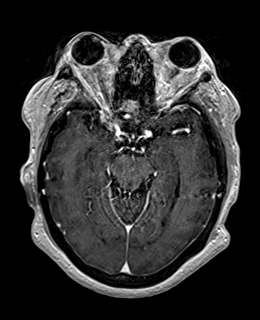
[im 91/160]
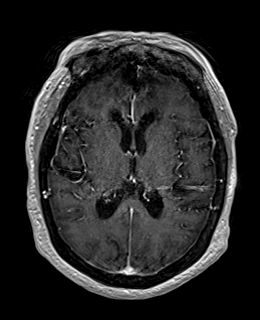
[im 114/160]
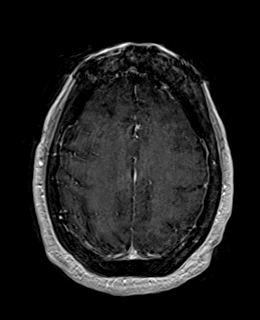
[im 137/160]
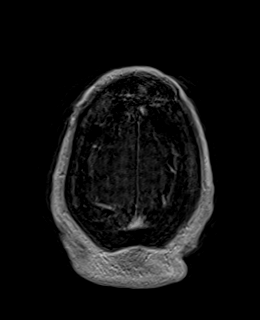
[im 160/160]
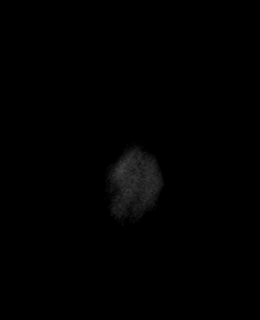

[Series 13: T1 post-contrast · coronal · 3.0mm · 0.57mm/px · 3 of 50 slices shown (2 of 2)]
[im 1/50]
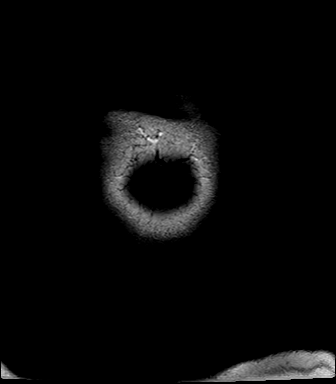
[im 25/50]
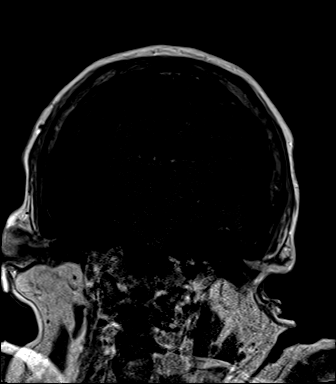
[im 50/50]
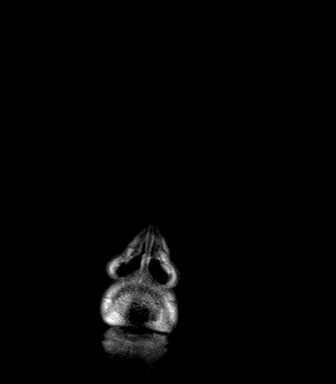

[Series 14: FLAIR post-contrast · sagittal · 3.0mm · 0.75mm/px · 2 of 39 slices shown]
[im 1/39]
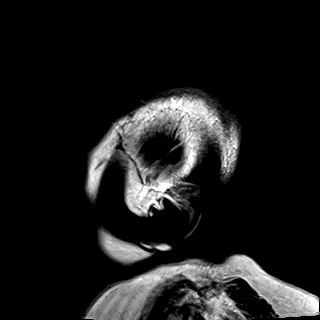
[im 39/39]
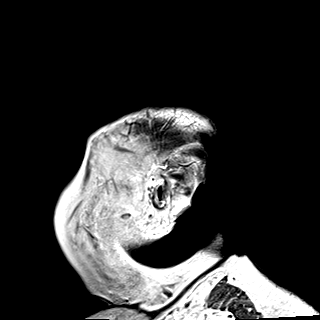

[48 of 48 positions shown; findings below may reference images not displayed]

FINDINGS: Brain: No acute infarction, hemorrhage, hydrocephalus or extra-axial
collection.

Posttreatment changes from frontal approach meningioma resection
with encephalomalacia and gliosis in the bilateral inferior frontal
lobes. Interval decrease in size of the residual enhancing tumor in
the planum sphenoidale, eccentric to the right, now measuring 11 x 8
mm as compared to 11 x 14 mm on prior. No new focus of abnormal
contrast enhancement identified.

Stable T2 hyperintensity within the pons, nonspecific.

Vascular: Normal flow voids.

Skull and upper cervical spine: Postsurgical changes from bifrontal
craniotomy. No focal marrow lesion identified.

Sinuses/Orbits: Negative.
IMPRESSION: 1. Interval decrease in size of the residual enhancing tumor in the
planum sphenoidale, now measuring 11 x 8 mm as compared to 11 x 14
mm on prior.
2. No new focus of abnormal contrast enhancement identified.

## 2021-11-15 NOTE — Telephone Encounter (Signed)
Patient returning call.

## 2021-11-15 NOTE — Telephone Encounter (Signed)
        Patient  visited [facility] on 12/10/2021  for fall   Telephone encounter attempt :    A HIPAA compliant voice message was left requesting a return call.  Instructed patient to call back at [phone].  [facility] [phone] [address] , [place] [address] Email : [email or web address]

## 2021-11-16 ENCOUNTER — Inpatient Hospital Stay: Payer: Medicare HMO

## 2021-11-16 ENCOUNTER — Inpatient Hospital Stay: Payer: Medicare HMO | Attending: Hematology

## 2021-11-16 VITALS — BP 148/57 | HR 76 | Temp 97.8°F | Resp 18

## 2021-11-16 DIAGNOSIS — Z79899 Other long term (current) drug therapy: Secondary | ICD-10-CM | POA: Insufficient documentation

## 2021-11-16 DIAGNOSIS — N1832 Chronic kidney disease, stage 3b: Secondary | ICD-10-CM

## 2021-11-16 DIAGNOSIS — D631 Anemia in chronic kidney disease: Secondary | ICD-10-CM | POA: Diagnosis not present

## 2021-11-16 DIAGNOSIS — I129 Hypertensive chronic kidney disease with stage 1 through stage 4 chronic kidney disease, or unspecified chronic kidney disease: Secondary | ICD-10-CM | POA: Insufficient documentation

## 2021-11-16 DIAGNOSIS — D638 Anemia in other chronic diseases classified elsewhere: Secondary | ICD-10-CM

## 2021-11-16 DIAGNOSIS — N182 Chronic kidney disease, stage 2 (mild): Secondary | ICD-10-CM | POA: Insufficient documentation

## 2021-11-16 LAB — CBC
HCT: 35.7 % — ABNORMAL LOW (ref 36.0–46.0)
Hemoglobin: 10.3 g/dL — ABNORMAL LOW (ref 12.0–15.0)
MCH: 22.7 pg — ABNORMAL LOW (ref 26.0–34.0)
MCHC: 28.9 g/dL — ABNORMAL LOW (ref 30.0–36.0)
MCV: 78.6 fL — ABNORMAL LOW (ref 80.0–100.0)
Platelets: 240 10*3/uL (ref 150–400)
RBC: 4.54 MIL/uL (ref 3.87–5.11)
RDW: 14.3 % (ref 11.5–15.5)
WBC: 7.8 10*3/uL (ref 4.0–10.5)
nRBC: 0 % (ref 0.0–0.2)

## 2021-11-16 MED ORDER — EPOETIN ALFA 10000 UNIT/ML IJ SOLN
10000.0000 [IU] | Freq: Once | INTRAMUSCULAR | Status: AC
  Administered 2021-11-16: 10000 [IU] via SUBCUTANEOUS
  Filled 2021-11-16: qty 1

## 2021-11-16 NOTE — Patient Instructions (Signed)
[name]  Discharge Instructions: Thank you for choosing [facility] to provide your oncology and hematology care.  If you have a lab appointment with the Cancer Center, please come in thru the Main Entrance and check in at the main information desk.  Wear comfortable clothing and clothing appropriate for easy access to any Portacath or PICC line.   We strive to give you quality time with your provider. You may need to reschedule your appointment if you arrive late (15 or more minutes).  Arriving late affects you and other patients whose appointments are after yours.  Also, if you miss three or more appointments without notifying the office, you may be dismissed from the clinic at the provider's discretion.      For prescription refill requests, have your pharmacy contact our office and allow 72 hours for refills to be completed.    Today you received the following Procrit, return as scheduled.  To help prevent nausea and vomiting after your treatment, we encourage you to take your nausea medication as directed.  BELOW ARE SYMPTOMS THAT SHOULD BE REPORTED IMMEDIATELY: *FEVER GREATER THAN 100.4 F (38 C) OR HIGHER *CHILLS OR SWEATING *NAUSEA AND VOMITING THAT IS NOT CONTROLLED WITH YOUR NAUSEA MEDICATION *UNUSUAL SHORTNESS OF BREATH *UNUSUAL BRUISING OR BLEEDING *URINARY PROBLEMS (pain or burning when urinating, or frequent urination) *BOWEL PROBLEMS (unusual diarrhea, constipation, pain near the anus) TENDERNESS IN MOUTH AND THROAT WITH OR WITHOUT PRESENCE OF ULCERS (sore throat, sores in mouth, or a toothache) UNUSUAL RASH, SWELLING OR PAIN  UNUSUAL VAGINAL DISCHARGE OR ITCHING   Items with * indicate a potential emergency and should be followed up as soon as possible or go to the Emergency Department if any problems should occur.  Please show the CHEMOTHERAPY ALERT CARD or IMMUNOTHERAPY ALERT CARD at check-in to the Emergency Department and triage  nurse.  Should you have questions after your visit or need to cancel or reschedule your appointment, please contact [name] [phone]  and follow the prompts.  Office hours are 8:00 a.m. to 4:30 p.m. Monday - Friday. Please note that voicemails left after 4:00 p.m. may not be returned until the following business day.  We are closed weekends and major holidays. You have access to a nurse at all times for urgent questions. Please call the main number to the clinic [phone] and follow the prompts.  For any non-urgent questions, you may also contact your provider using MyChart. We now offer e-Visits for anyone 18 and older to request care online for non-urgent symptoms. For details visit [email or web address].   Also download the MyChart app! Go to the app store, search "MyChart", open the app, select [facility], and log in with your MyChart username and password.  Masks are optional in the cancer centers. If you would like for your care team to wear a mask while they are taking care of you, please let them know. You may have one support person who is at least 71 years old accompany you for your appointments.  

## 2021-11-16 NOTE — Progress Notes (Signed)
Patient presents today for procrit injection. Hemoglobin reviewed prior to administration. VSS tolerated without incident or complaint. See MAR for details. Patient stable during and after injection. Patient discharged in satisfactory condition with no s/s of distress noted. 

## 2021-11-17 ENCOUNTER — Ambulatory Visit (INDEPENDENT_AMBULATORY_CARE_PROVIDER_SITE_OTHER): Payer: Medicare HMO | Admitting: *Deleted

## 2021-11-17 DIAGNOSIS — E1143 Type 2 diabetes mellitus with diabetic autonomic (poly)neuropathy: Secondary | ICD-10-CM

## 2021-11-17 DIAGNOSIS — N189 Chronic kidney disease, unspecified: Secondary | ICD-10-CM | POA: Diagnosis not present

## 2021-11-17 DIAGNOSIS — E1129 Type 2 diabetes mellitus with other diabetic kidney complication: Secondary | ICD-10-CM | POA: Diagnosis not present

## 2021-11-17 DIAGNOSIS — N170 Acute kidney failure with tubular necrosis: Secondary | ICD-10-CM | POA: Diagnosis not present

## 2021-11-17 DIAGNOSIS — I5032 Chronic diastolic (congestive) heart failure: Secondary | ICD-10-CM | POA: Diagnosis not present

## 2021-11-17 DIAGNOSIS — I10 Essential (primary) hypertension: Secondary | ICD-10-CM

## 2021-11-17 DIAGNOSIS — J449 Chronic obstructive pulmonary disease, unspecified: Secondary | ICD-10-CM

## 2021-11-17 DIAGNOSIS — R809 Proteinuria, unspecified: Secondary | ICD-10-CM | POA: Diagnosis not present

## 2021-11-17 DIAGNOSIS — E1122 Type 2 diabetes mellitus with diabetic chronic kidney disease: Secondary | ICD-10-CM | POA: Diagnosis not present

## 2021-11-17 DIAGNOSIS — I129 Hypertensive chronic kidney disease with stage 1 through stage 4 chronic kidney disease, or unspecified chronic kidney disease: Secondary | ICD-10-CM | POA: Diagnosis not present

## 2021-11-17 NOTE — Telephone Encounter (Signed)
Called pt no answer °

## 2021-11-17 NOTE — Patient Instructions (Signed)
Visit Information  Thank you for taking time to visit with me today. Please don't hesitate to contact me if I can be of assistance to you before our next scheduled telephone appointment.  Following are the goals we discussed today:  Take medications as prescribed   Attend all scheduled provider appointments Call pharmacy for medication refills 3-7 days in advance of running out of medications Attend church or other social activities Perform all self care activities independently  Perform IADL's (shopping, preparing meals, housekeeping, managing finances) independently Call provider office for new concerns or questions  check blood sugar at prescribed times: 4 times daily check feet daily for cuts, sores or redness enter blood sugar readings and medication or insulin into daily log take the blood sugar log to all doctor visits take the blood sugar meter to all doctor visits trim toenails straight across fill half of plate with vegetables limit fast food meals to no more than 1 per week prepare main meal at home 3 to 5 days each week wash and dry feet carefully every day wear comfortable, cotton socks eliminate smoking in my home identify and remove indoor air pollutants develop a rescue plan eliminate symptom triggers at home follow rescue plan if symptoms flare-up eat healthy/prescribed diet: carbohydrate modified, low sodium, heart healthy check blood pressure 3 times per week write blood pressure results in a log or diary take blood pressure log to all doctor appointments take medications for blood pressure exactly as prescribed eat more whole grains, fruits and vegetables, lean meats and healthy fats Follow low sodium diet Follow CHF action plan- call your doctor for weight gain 3 pounds overnight or 5 pounds in one week Continue to weigh daily and record Adhere to COPD action plan Continue doing exercises prescribed by physical therapy fall prevention strategies: change  position slowly, use assistive device such as walker or cane (per provider recommendations) when walking, keep walkways clear, have good lighting in room. It is important to contact your provider if you have any falls, maintain muscle strength/tone by exercise per provider recommendations.  Our next appointment is by telephone on 12/27/21 at 10 am  Please call the care guide team at [phone] if you need to cancel or reschedule your appointment.   If you are experiencing a Mental Health or [place] or need someone to talk to, please call the Suicide and Crisis Lifeline: 988 call the [place] National Suicide Prevention Lifeline: 854-248-8600 or TTY: (248)775-3673 TTY (347) 643-5620) to talk to a trained counselor call 1-800-273-TALK (toll free, 24 hour hotline) go to [facility] [address], [place] [phone]) call the [place]: ([phone] call 911   Patient verbalizes understanding of instructions and care plan provided today and agrees to view in [place]. Active MyChart status and patient understanding of how to access instructions and care plan via MyChart confirmed with patient.     Jacqlyn Larsen [facility], BSN RN Case Manager [facility] [phone]

## 2021-11-17 NOTE — Chronic Care Management (AMB) (Signed)
Chronic Care Management   CCM RN Visit Note  11/17/2021 Name: [name] MRN: [id number] DOB: [birth year]  Subjective: [name] is a 71 y.o. year old female who is a primary care patient of [name] Helper, MD. The care management team was consulted for assistance with disease management and care coordination needs.    Engaged with patient by telephone for follow up visit in response to provider referral for case management and/or care coordination services.   Consent to Services:  The patient was given information about Chronic Care Management services, agreed to services, and gave verbal consent prior to initiation of services.  Please see initial visit note for detailed documentation.   Patient agreed to services and verbal consent obtained.   Assessment: Review of patient past medical history, allergies, medications, health status, including review of consultants reports, laboratory and other test data, was performed as part of comprehensive evaluation and provision of chronic care management services.   SDOH (Social Determinants of Health) assessments and interventions performed:  SDOH Interventions    Flowsheet Row Chronic Care Management from 08/23/2021 in [facility] Patient Outreach Telephone from 05/21/2019 in [facility] Patient Outreach Telephone from 05/13/2019 in [facility] Patient Outreach Telephone from 01/15/2019 in [facility] Patient Outreach Telephone from 12/24/2018 in [facility] Patient Outreach Telephone from 12/11/2018 in [facility] Interventions Intervention Not Indicated -- -- -- -- --  Housing Interventions Intervention Not Indicated -- -- -- -- --  Transportation Interventions Intervention Not Indicated -- -- -- -- --  Depression Interventions/Treatment  -- Medication, Counseling, Currently on Treatment Medication, Counseling, Currently on  Treatment Medication, Counseling, Currently on Treatment Medication, Counseling, Currently on Treatment Medication, Counseling, Currently on Treatment        CCM Care Plan  Allergies  Allergen Reactions   Iron Nausea And Vomiting    And itching And itching   Milk ([place]) Rash    Doesn't agree with stomach.  Doesn't agree with stomach.  Doesn't agree with stomach.    Penicillins Hives    Has patient had a PCN reaction causing immediate rash, facial/tongue/throat swelling, SOB or lightheadedness with hypotension: Yes Has patient had a PCN reaction causing severe rash involving mucus membranes or skin necrosis: No Has patient had a PCN reaction that required hospitalization No Has patient had a PCN reaction occurring within the last 10 years: No If all of the above answers are "NO", then may proceed with Cephalosporin use.  Other reaction(s): Other (see comments) Has patient had a PCN reaction causing immediate rash, facial/tongue/throat swelling, SOB or lightheadedness with hypotension: Yes Has patient had a PCN reaction causing severe rash involving mucus membranes or skin necrosis: No Has patient had a PCN reaction that required hospitalization No Has patient had a PCN reaction occurring within the last 10 years: No If all of the above answers are "NO", then may proceed with Cephalosporin use. Has patient had a PCN reaction causing immediate rash, facial/tongue/throat swelling, SOB or lightheadedness with hypotension: Yes Has patient had a PCN reaction causing severe rash involving mucus membranes or skin necrosis: No Has patient had a PCN reaction that required hospitalization No Has patient had a PCN reaction occurring within the last 10 years: No If all of the above answers are "NO", then may proceed with Cephalosporin use. Has patient had a PCN reaction causing immediate rash, facial/tongue/throat swelling, SOB or lightheadedness  with hypotension: Yes Has patient had a PCN  reaction causing severe rash involving mucus membranes or skin necrosis: No Has patient had a PCN reaction that required hospitalization No Has patient had a PCN reaction occurring within the last 10 years: No If all of the above answers are "NO", then may proceed with Cephalosporin use. Has patient had a PCN reaction causing immediate rash, facial/tongue/throat swelling, SOB or lightheadedness with hypotension: Yes Has patient had a PCN reaction causing sever... (TRUNCATED)   Phenazopyridine Hives   Phenazopyridine Hcl Hives   Cephalexin Hives   Flonase [Fluticasone]     "It gave me ulcers in my nose"   Milk-Related Compounds Other (See Comments)    Doesn't agree with stomach.    Phenazopyridine Hcl Hives          Outpatient Encounter Medications as of 11/17/2021  Medication Sig Note   ACCU-CHEK GUIDE test strip TEST BLOOD SUGAR FOUR TIMES DAILY  AND AS NEEDED    Accu-Chek Softclix Lancets lancets TEST BLOOD SUGAR THREE TIMES DAILY AS DIRECTED    acetaminophen (TYLENOL) 325 MG tablet Take 650 mg by mouth every 6 (six) hours as needed.    albuterol (VENTOLIN HFA) 108 (90 Base) MCG/ACT inhaler INHALE 1 TO 2 PUFFS EVERY 6 HOURS AS NEEDED FOR WHEEZING, SHORTNESS OF BREATH (Patient taking differently: Inhale 1-2 puffs into the lungs every 6 (six) hours as needed for wheezing or shortness of breath.)    Alcohol Swabs (DROPSAFE ALCOHOL PREP) 70 % PADS USE TO CLEAN FINGER PRIOR TO TESTING FOR BLOOD SUGAR  AS DIRECTED    alendronate (FOSAMAX) 70 MG tablet TAKE 1 TABLET EVERY 7 DAYS ON AN EMPTY STOMACH WITH A FULL GLASS OF WATER (Patient taking differently: Take 70 mg by mouth once a week.)    amLODipine (NORVASC) 10 MG tablet TAKE 1 TABLET EVERY DAY (Patient taking differently: Take 10 mg by mouth daily.)    ascorbic acid (VITAMIN C) 500 MG tablet Take 500 mg by mouth daily.    aspirin EC 81 MG tablet Take 1 tablet (81 mg total) by mouth daily with breakfast.    Blood Glucose Calibration (ACCU-CHEK  GUIDE CONTROL) LIQD USE AS DIRECTED    blood glucose meter kit and supplies Dispense based on patient and insurance preference. Use up to four times daily as directed. (FOR ICD-10 E10.9, E11.9).    buPROPion (WELLBUTRIN XL) 150 MG 24 hr tablet Take 1 tablet (150 mg total) by mouth every morning.    Calcium Carb-Cholecalciferol (CALTRATE 600+D3 SOFT) 600-20 MG-MCG CHEW Chew 1 tablet by mouth daily at 12 noon.    cetirizine (ZYRTEC) 10 MG tablet Take 1 tablet (10 mg total) by mouth daily.    Cholecalciferol (D3 PO) Take 1 tablet by mouth daily. 09/16/2021: Pt does not know how many units   Clobetasol Prop Emollient Base (CLOBETASOL PROPIONATE E) 0.05 % emollient cream Apply to affected area qd (Patient taking differently: Apply 1 Application topically daily.)    Continuous Blood Gluc Sensor (FREESTYLE LIBRE 14 DAY SENSOR) MISC 1 each by Does not apply route every 14 (fourteen) days. Change every 2 weeks    diclofenac Sodium (VOLTAREN) 1 % GEL APPLY 2 GRAMS TOPICALLY FOUR TIMES DAILY. (Patient taking differently: Apply 2 g topically 4 (four) times daily.)    Docusate Sodium (DSS) 100 MG CAPS Take 1 tablet by mouth in the morning and at bedtime.    DROPLET PEN NEEDLES 31G X 8 MM MISC USE FOR INJECTING INSULIN 4  TIMES DAILY.    ezetimibe (ZETIA) 10 MG tablet TAKE 1 TABLET EVERY DAY (Patient taking differently: Take 10 mg by mouth daily.)    insulin aspart (NOVOLOG FLEXPEN) 100 UNIT/ML FlexPen Inject 5 Units into the skin 3 (three) times daily with meals. Give if eats 50% or more of meal. (Patient taking differently: Inject 14 Units into the skin 3 (three) times daily with meals. Give if eats 50% or more of meal.)    insulin glargine, 2 Unit Dial, (TOUJEO MAX SOLOSTAR) 300 UNIT/ML Solostar Pen Inject 20-25 Units into the skin daily.    lactobacillus acidophilus (BACID) TABS tablet Take 2 tablets by mouth 3 (three) times daily.    lamoTRIgine (LAMICTAL) 100 MG tablet Take 1 tablet (100 mg total) by mouth 2  (two) times daily.    levothyroxine (SYNTHROID) 50 MCG tablet TAKE 1 TABLET EVERY DAY FOR 6 DAYS PER WEEK, 1/2 TABLET 1 DAY PER WEEK    LORazepam (ATIVAN) 0.5 MG tablet Take 1 tablet (0.5 mg total) by mouth 2 (two) times daily.    losartan (COZAAR) 50 MG tablet TAKE 1 TABLET EVERY DAY    meclizine (ANTIVERT) 25 MG tablet Take 1 tablet (25 mg total) by mouth 3 (three) times daily as needed for dizziness.    metoprolol tartrate (LOPRESSOR) 50 MG tablet TAKE 1 TABLET TWICE DAILY (NEED MD APPOINTMENT) (Patient taking differently: Take 50 mg by mouth 2 (two) times daily.)    Misc. Devices (MATTRESS PAD) MISC FIRM MATTRESS PAD for hospital bed x 1    montelukast (SINGULAIR) 10 MG tablet TAKE 1 TABLET EVERY DAY (Patient taking differently: Take 10 mg by mouth at bedtime.)    Multiple Vitamins-Minerals (MULTIVITAMIN GUMMIES ADULT PO) Take 1 tablet by mouth daily.    MYRBETRIQ 25 MG TB24 tablet TAKE 1 TABLET EVERY DAY    nystatin (MYCOSTATIN/NYSTOP) powder APPLY TO AFFECTED AREA 4 TIMES DAILY. (Patient taking differently: Apply 1 Application topically in the morning, at noon, in the evening, and at bedtime.)    Omega-3 Fatty Acids (FISH OIL PO) Take 1 capsule by mouth daily.    ondansetron (ZOFRAN) 4 MG tablet Take 1 tablet (4 mg total) by mouth every 8 (eight) hours as needed for nausea or vomiting.    oxyCODONE-acetaminophen (PERCOCET) 10-325 MG tablet Take 1 tablet by mouth every 8 (eight) hours as needed for pain.    pregabalin (LYRICA) 75 MG capsule Take 1 capsule (75 mg total) by mouth daily.    RABEprazole (ACIPHEX) 20 MG tablet TAKE 1 TABLET TWICE DAILY    RESTASIS 0.05 % ophthalmic emulsion Place 2 drops into both eyes daily.    risperiDONE (RISPERDAL) 0.5 MG tablet Take 1 tablet (0.5 mg total) by mouth at bedtime.    rosuvastatin (CRESTOR) 5 MG tablet TAKE 1 TABLET AT BEDTIME.    sertraline (ZOLOFT) 100 MG tablet Take 1 tablet (100 mg total) by mouth every morning.    traZODone (DESYREL) 150 MG  tablet Take 1 tablet (150 mg total) by mouth at bedtime.    vitamin E 180 MG (400 UNITS) capsule Take 400 Units by mouth daily.    JARDIANCE 10 MG TABS tablet Take 10 mg by mouth daily. (Patient not taking: Reported on 11/09/2021)    STIOLTO RESPIMAT 2.5-2.5 MCG/ACT AERS INHALE 2 PUFFS INTO THE LUNGS DAILY. (Patient not taking: Reported on 11/09/2021)    No facility-administered encounter medications on file as of 11/17/2021.    Patient Active Problem List   Diagnosis Date  Noted   Anemia in chronic kidney disease (CKD) 10/12/2021   Subconjunctival hemorrhage of left eye 10/02/2021   Dehydration 09/25/2021   Encounter for support and coordination of transition of care 09/25/2021   Acute renal failure superimposed on stage 3a chronic kidney disease ([place]) 09/23/2021   AKI (acute kidney injury) ([place]) 09/16/2021   CHF (congestive heart failure) ([place]) 09/16/2021   Abnormal EKG 09/16/2021   Urinary frequency 09/01/2021   Anemia of chronic disease 08/10/2021   Generalized pain 08/04/2021   Sciatica, right side 07/19/2021   Breast pain, left 07/10/2021   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[place]) 07/10/2021   Atherosclerosis of aorta (HCC) 07/10/2021   Generalized weakness 05/12/2021   Advanced care planning/counseling discussion 12/23/2020   Head trauma, initial encounter 12/20/2020   Trigger finger, right 12/12/2020   Confusion 11/24/2020   Blurry vision 11/18/2020   Cervical radiculopathy 07/09/2020   Closed fracture of lateral malleolus of right fibula 02/27/2020   Headache 02/19/2020   Tubular adenoma of colon 02/09/2019   Benign neoplasm of cerebral meninges (HCC) 11/12/2018   Benign meningioma of brain ([place]) 10/31/2018   Osteoarthritis of both hips 07/06/2018   Lipoma of extremity 12/31/2017   Impingement syndrome of left shoulder region 12/27/2017   Weakness generalized    Leg weakness, bilateral 10/28/2017   Lumbar  spondylosis with myelopathy 10/12/2017   Numbness of hand 10/10/2017   Chronic neck pain 07/25/2017   Chronic pain syndrome 07/25/2017   At risk for cardiovascular event 92/42/6834   Acute metabolic encephalopathy 19/62/2297   Diabetic polyneuropathy associated with type 2 diabetes mellitus ([place]) 05/19/2016   Tobacco user 04/24/2016   Obesity (BMI 30.0-34.9) 04/24/2016   History of palpitations 08/09/2015   Labile hypertension 08/03/2015   Normal coronary arteries 08/03/2015   Dizziness 07/15/2015   Left-sided low back pain with left-sided sciatica 06/27/2015   Multinodular goiter 05/06/2015   Rectocele, female 04/27/2015   Anal sphincter incontinence 04/27/2015   Pelvic relaxation due to rectocele 03/30/2015   Hyperkalemia 02/22/2015   Pulmonary hypertension ([place]) 02/22/2015   Posterior chest pain 02/21/2015   Episodic cigarette smoking dependence 01/11/2015   Migraine without aura and without status migrainosus, not intractable 07/02/2014   Flatulence 02/18/2014   Microcytic anemia 02/18/2014   Acquired hypothyroidism 08/16/2013   Gastroparesis 04/28/2013   Nicotine dependence 03/09/2013   Seizure disorder ([place]) 01/19/2013   Displacement of cervical intervertebral disc without myelopathy 12/13/2012   Bursitis of shoulder 12/13/2012   UTI (urinary tract infection) 12/01/2012   Cervical disc disorder with radiculopathy of cervical region 10/31/2012   Solitary pulmonary nodule 08/19/2012   Anemia 07/05/2012   Hypersomnia disorder related to a known organic factor 06/11/2012   Pruritus 04/18/2012   Meningioma ([place]) 11/19/2011   Mononeuritis leg 10/25/2011   Carpal tunnel syndrome of right wrist 05/23/2011   Chronic pain of right hand 05/04/2011   Polypharmacy 04/28/2011   Mood disorder ([place]) 04/28/2011   Constipation 04/13/2011   Recurrent falls 12/12/2010   Oropharyngeal dysphagia 07/12/2010   Urinary incontinence 12/16/2009   HEARING LOSS 10/26/2009   DMII (diabetes  mellitus, type 2) ([place]) 07/07/2009   Mixed hyperlipidemia 12/11/2008   IBS 12/11/2008   GERD 07/29/2008   MILK PRODUCTS ALLERGY 07/29/2008   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/03/2007   Essential hypertension 06/27/2007   Backache 06/19/2007   Osteoporosis 06/19/2007   Obstructive sleep apnea 06/19/2007   TRIGGER FINGER 04/18/2007   DIVERTICULOSIS,  COLON 11/13/2006    Conditions to be addressed/monitored:CHF, HTN, COPD, and DMII  Care Plan : RN Care Manager Plan of Care  Updates made by Kassie Mends, RN since 11/17/2021 12:00 AM     Problem: No plan of care established for management of chronic disease state  (HTN, COPD, DM2, CHF, High risk for falls)   Priority: High     Long-Range Goal: Development of plan of care for chronic disease management  (HTN, COPD, DM2, CHF, High risk for falls)   Start Date: 08/23/2021  Expected End Date: 02/19/2022  Priority: High  Note:   Current Barriers:  Knowledge Deficits related to plan of care for management of HTN, COPD, and DMII  Chronic Disease Management support and education needs related to HTN, COPD, and DMII Patient reports she lives alone, has CAP aide 8 hours daily x 5 days per week, has church members that assist her frequently, pt does not drive, has necessary DME in the home and recently got a new a walker, pt reports she "slid down in the bathtub, not a fall" and went to hospital for weakness, pt reports she has shower chair and aide assists as needed with bathing, cooking, cleaning, etc.  Pt reports she checks blood pressure on occasion with "readings usually good", checks CBG QID with fasting reading today 144, random ranges are usually low 100's, states uses [employer], follows special diet "sometimes", smokes 2 cigarettes per day and is still working on complete smoking cessation.  Pt reports she does not feel depressed today and sees LCSW Maurice Small for counseling and feels this has helped  tremendously.  Patient reports >3 falls in the past year with most recent 11/09/21, went to ED, states " no injury"  pt states she "slipped instead of falling" Patient states her aide is out doing laundry, home health PT continues, pt reports she has all medications and taking as prescribed. Patient states she continues weighing daily with weight today "thinks it's around 158 pounds" has medication and taking as prescribed, pt continues checking blood pressure with "readings good"  RNCM Clinical Goal(s):  Patient will verbalize understanding of plan for management of HTN, COPD, and DMII as evidenced by patient report, review of EHR and  through collaboration with RN Care manager, provider, and care team.   Interventions: 1:1 collaboration with primary care provider regarding development and update of comprehensive plan of care as evidenced by provider attestation and co-signature Inter-disciplinary care team collaboration (see longitudinal plan of care) Evaluation of current treatment plan related to  self management and patient's adherence to plan as established by provider  Heart Failure Interventions:  (Status:  New goal. and Goal on track:  Yes.) Long Term Goal Provided education about placing scale on hard, flat surface Advised patient to weigh each morning after emptying bladder Discussed importance of daily weight and advised patient to weigh and record daily Discussed the importance of keeping all appointments with provider  Reinforced Heart Failure action plan, importance of calling for 3 pound weight gain overnight or 5 pound weight gain in one week  COPD Interventions:  (Status:  New goal. and Goal on track:  Yes.) Long Term Goal Advised patient to self assesses COPD action plan zone and make appointment with provider if in the yellow zone for 48 hours without improvement Advised patient to engage in light exercise as tolerated 3-5 days a week to aid in the the management of  COPD Discussed the importance of adequate rest and  management of fatigue with COPD Reinforced COPD action plan Reinforced safety precautions and importance of asking for assistance as needed and always using walker  Diabetes Interventions:  (Status:  New goal. and Goal on track:  Yes.) Long Term Goal Assessed patient's understanding of A1c goal: <7% Reviewed medications with patient and discussed importance of medication adherence Counseled on importance of regular laboratory monitoring as prescribed Review of patient status, including review of consultants reports, relevant laboratory and other test results, and medications completed Reviewed importance of working with home health PT for strengthening Encouraged patient to complete exercises prescribed by PT Reviewed carbohydrate modified diet Reviewed CBG log with patient  Lab Results  Component Value Date   HGBA1C 5.9 (A) 05/20/2021   Hypertension Interventions:  (Status:  New goal. and Goal on track:  Yes.) Long Term Goal Last practice recorded BP readings:  BP Readings from Last 3 Encounters:  08/16/21 137/62  08/10/21 (!) 164/68  07/19/21 116/62  Most recent eGFR/CrCl:  Lab Results  Component Value Date   EGFR 52 (L) 05/10/2021    No components found for: "CRCL"  Evaluation of current treatment plan related to hypertension self management and patient's adherence to plan as established by provider Counseled on the importance of exercise goals with target of 150 minutes per week Advised patient, providing education and rationale, to monitor blood pressure daily and record, calling PCP for findings outside established parameters Discussed complications of poorly controlled blood pressure such as heart disease, stroke, circulatory complications, vision complications, kidney impairment, sexual dysfunction Reviewed importance of following low sodium diet and reading labels  Patient Goals/Self-Care Activities: Take medications as  prescribed   Attend all scheduled provider appointments Call pharmacy for medication refills 3-7 days in advance of running out of medications Attend church or other social activities Perform all self care activities independently  Perform IADL's (shopping, preparing meals, housekeeping, managing finances) independently Call provider office for new concerns or questions  check blood sugar at prescribed times: 4 times daily check feet daily for cuts, sores or redness enter blood sugar readings and medication or insulin into daily log take the blood sugar log to all doctor visits take the blood sugar meter to all doctor visits trim toenails straight across fill half of plate with vegetables limit fast food meals to no more than 1 per week prepare main meal at home 3 to 5 days each week wash and dry feet carefully every day wear comfortable, cotton socks eliminate smoking in my home identify and remove indoor air pollutants develop a rescue plan eliminate symptom triggers at home follow rescue plan if symptoms flare-up eat healthy/prescribed diet: carbohydrate modified, low sodium, heart healthy check blood pressure 3 times per week write blood pressure results in a log or diary take blood pressure log to all doctor appointments take medications for blood pressure exactly as prescribed eat more whole grains, fruits and vegetables, lean meats and healthy fats Follow low sodium diet Follow CHF action plan- call your doctor for weight gain 3 pounds overnight or 5 pounds in one week Continue to weigh daily and record Adhere to COPD action plan Continue doing exercises prescribed by physical therapy fall prevention strategies: change position slowly, use assistive device such as walker or cane (per provider recommendations) when walking, keep walkways clear, have good lighting in room. It is important to contact your provider if you have any falls, maintain muscle strength/tone by exercise  per provider recommendations.       Plan:Telephone follow  up appointment with care management team member scheduled for:  12/27/21 at 79 am  Jacqlyn Larsen [facility], BSN RN Case Manager [facility] [phone]

## 2021-11-18 ENCOUNTER — Ambulatory Visit (HOSPITAL_COMMUNITY): Payer: Medicare HMO | Admitting: Psychiatry

## 2021-11-18 ENCOUNTER — Telehealth: Payer: Self-pay | Admitting: Internal Medicine

## 2021-11-18 DIAGNOSIS — G4733 Obstructive sleep apnea (adult) (pediatric): Secondary | ICD-10-CM

## 2021-11-18 NOTE — Telephone Encounter (Signed)
Patient called asked if nurse could check on her walker has not heard anything, she asked for nurse to give her a call back today. [phone].

## 2021-11-18 NOTE — Telephone Encounter (Signed)
I told her that I sent an order for a walker but the DME company sent a message saying [employer] Rosset[employer] denied it. I'm not sure what to do. SHe may need an appt with simpson to see if anything can be done. They are denying it because she is not eligible for another walker for 5 years and she just got the one she says doesn't roll

## 2021-11-20 ENCOUNTER — Telehealth: Payer: Self-pay | Admitting: Emergency Medicine

## 2021-11-20 ENCOUNTER — Other Ambulatory Visit: Payer: Self-pay

## 2021-11-20 ENCOUNTER — Encounter: Payer: Self-pay | Admitting: Emergency Medicine

## 2021-11-20 ENCOUNTER — Ambulatory Visit
Admission: EM | Admit: 2021-11-20 | Discharge: 2021-11-20 | Disposition: A | Discharge status: Home / Self Care | Payer: Medicare HMO | Attending: Physician Assistant | Admitting: Physician Assistant

## 2021-11-20 ENCOUNTER — Ambulatory Visit (INDEPENDENT_AMBULATORY_CARE_PROVIDER_SITE_OTHER): Payer: Medicare HMO

## 2021-11-20 ENCOUNTER — Ambulatory Visit: Payer: Self-pay

## 2021-11-20 DIAGNOSIS — R051 Acute cough: Secondary | ICD-10-CM | POA: Insufficient documentation

## 2021-11-20 DIAGNOSIS — R059 Cough, unspecified: Secondary | ICD-10-CM | POA: Diagnosis not present

## 2021-11-20 DIAGNOSIS — J441 Chronic obstructive pulmonary disease with (acute) exacerbation: Secondary | ICD-10-CM | POA: Diagnosis not present

## 2021-11-20 DIAGNOSIS — J9811 Atelectasis: Secondary | ICD-10-CM | POA: Diagnosis not present

## 2021-11-20 DIAGNOSIS — Z20822 Contact with and (suspected) exposure to covid-19: Secondary | ICD-10-CM | POA: Insufficient documentation

## 2021-11-20 LAB — RESP PANEL BY RT-PCR (FLU A&B, COVID) ARPGX2
Influenza A by PCR: NEGATIVE
Influenza B by PCR: NEGATIVE
SARS Coronavirus 2 by RT PCR: NEGATIVE

## 2021-11-20 MED ORDER — PREDNISONE 20 MG PO TABS: 20.0000 mg | ORAL_TABLET | Freq: Every day | ORAL | 0 refills | Status: DC

## 2021-11-20 MED ORDER — DOXYCYCLINE HYCLATE 100 MG PO CAPS: 100.0000 mg | ORAL_CAPSULE | Freq: Two times a day (BID) | ORAL | 0 refills | Status: DC

## 2021-11-20 MED ORDER — BENZONATATE 100 MG PO CAPS: 100.0000 mg | ORAL_CAPSULE | Freq: Three times a day (TID) | ORAL | 0 refills | Status: DC

## 2021-11-20 NOTE — ED Provider Notes (Signed)
[facility]    CSN: [id number] Arrival date & time: 11/20/21  3254      History   Chief Complaint Chief Complaint  Patient presents with   Cough    HPI [name] is a 71 y.o. female.   Patient presents today with a 3-day history of URI symptoms.  Reports cough, fatigue, malaise, congestion.  Denies any chest pain, shortness of breath, fever, nausea, vomiting.  She has been taking over-the-counter medications without improvement of symptoms.  Denies any known sick contacts.  She has not had COVID in the past.  She is up-to-date on COVID vaccinations.  She does report a history of COPD and has been taking albuterol inhaler without improvement of symptoms.  She is a former smoker.  Denies any recent steroids or antibiotic use.  Denies any recent falls; does report that she fell a little over a week ago but was evaluated for this without any traumatic findings.  She has not fallen more recently.    Past Medical History:  Diagnosis Date   Allergy    Anemia    Anemia in chronic kidney disease (CKD) 08/10/2021   Anemia in chronic kidney disease (CKD) 10/12/2021   Anxiety    takes Ativan daily   Arthritis    Assistance needed for mobility    Bipolar disorder ([place])    takes Risperdal nightly   Blood transfusion    Brain tumor ([place])    Cancer ([place])    In her gum   Carpal tunnel syndrome of right wrist 05/23/2011   Cervical disc disorder with radiculopathy of cervical region 10/31/2012   Chronic back pain    Chronic idiopathic constipation    Chronic neck and back pain    Colon polyps    COPD (chronic obstructive pulmonary disease) with chronic bronchitis ([place]) 09/16/2013   Office Spirometry 10/30/2013-submaximal effort based on appearance of loop and curve. Numbers would fit with severe restriction but her physiologic capability may be better than this. FVC 0.91/44%, and 10.74/45%, FEV1/FVC 0.81, FEF 25-75% 1.43/69%     Diabetes mellitus    Type II   Diverticulosis     TCS 9/08 by Dr. Delfin Edis for diarrhea . Bx for micro scopic colitis negative.    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   Metabolic encephalopathy 30/94/0768   Migraines    chronic headaches   Mononeuritis lower limb    Narcolepsy    Osteoporosis    Pancreatitis 2006   due to Depakote with normal EUS    Paralysis (HCC)    Schatzki's ring    non critical / EGD with ED 8/2011with RMR   Seizures ([place])    takes Lamictal daily.Last seizure 3 yrs ago   Sleep apnea    on CPAP   Small bowel obstruction (HCC)    Stroke ([place])    left sided weakness, speech changes   Tubular adenoma of  colon     Patient Active Problem List   Diagnosis Date Noted   Anemia in chronic kidney disease (CKD) 10/12/2021   Subconjunctival hemorrhage of left eye 10/02/2021   Dehydration 09/25/2021   Encounter for support and coordination of transition of care 09/25/2021   Acute renal failure superimposed on stage 3a chronic kidney disease ([place]) 09/23/2021   AKI (acute kidney injury) ([place]) 09/16/2021   CHF (congestive heart failure) ([place]) 09/16/2021   Abnormal EKG 09/16/2021   Urinary frequency 09/01/2021   Anemia of chronic disease 08/10/2021   Generalized pain 08/04/2021   Sciatica, right side 07/19/2021   Breast pain, left 07/10/2021   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[place]) 07/10/2021   Atherosclerosis of aorta (HCC) 07/10/2021   Generalized weakness 05/12/2021   Advanced  care planning/counseling discussion 12/23/2020   Head trauma, initial encounter 12/20/2020   Trigger finger, right 12/12/2020   Confusion 11/24/2020   Blurry vision 11/18/2020   Cervical radiculopathy 07/09/2020   Closed fracture of lateral malleolus of right fibula 02/27/2020   Headache 02/19/2020   Tubular adenoma of colon 02/09/2019   Benign neoplasm of cerebral meninges (HCC) 11/12/2018   Benign meningioma of brain ([place]) 10/31/2018   Osteoarthritis of both hips 07/06/2018   Lipoma of extremity 12/31/2017   Impingement syndrome of left shoulder region 12/27/2017   Weakness generalized    Leg weakness, bilateral 10/28/2017   Lumbar spondylosis with myelopathy 10/12/2017   Numbness of hand 10/10/2017   Chronic neck pain 07/25/2017   Chronic pain syndrome 07/25/2017   At risk for cardiovascular event 56/21/3086   Acute metabolic encephalopathy 57/84/6962   Diabetic polyneuropathy associated with type 2 diabetes mellitus ([place]) 05/19/2016   Tobacco user 04/24/2016   Obesity (BMI 30.0-34.9) 04/24/2016   History of palpitations 08/09/2015   Labile hypertension 08/03/2015   Normal coronary arteries 08/03/2015   Dizziness 07/15/2015   Left-sided low back pain with left-sided sciatica 06/27/2015   Multinodular goiter 05/06/2015   Rectocele, female 04/27/2015   Anal sphincter incontinence 04/27/2015   Pelvic relaxation due to rectocele 03/30/2015   Hyperkalemia 02/22/2015   Pulmonary hypertension ([place]) 02/22/2015   Posterior chest pain 02/21/2015   Episodic cigarette smoking dependence 01/11/2015   Migraine without aura and without status migrainosus, not intractable 07/02/2014   Flatulence 02/18/2014   Microcytic anemia 02/18/2014   Acquired hypothyroidism 08/16/2013   Gastroparesis 04/28/2013   Nicotine dependence 03/09/2013   Seizure disorder ([place]) 01/19/2013   Displacement of cervical intervertebral disc without myelopathy 12/13/2012   Bursitis of shoulder 12/13/2012   UTI  (urinary tract infection) 12/01/2012   Cervical disc disorder with radiculopathy of cervical region 10/31/2012   Solitary pulmonary nodule 08/19/2012   Anemia 07/05/2012   Hypersomnia disorder related to a known organic factor 06/11/2012   Pruritus 04/18/2012   Meningioma ([place]) 11/19/2011   Mononeuritis leg 10/25/2011   Carpal tunnel syndrome of right wrist 05/23/2011   Chronic pain of right hand 05/04/2011   Polypharmacy 04/28/2011   Mood disorder ([place]) 04/28/2011   Constipation 04/13/2011   Recurrent falls 12/12/2010   Oropharyngeal dysphagia 07/12/2010   Urinary incontinence 12/16/2009   HEARING LOSS 10/26/2009   DMII (diabetes mellitus, type 2) ([place]) 07/07/2009   Mixed hyperlipidemia 12/11/2008   IBS 12/11/2008   GERD 07/29/2008   MILK PRODUCTS ALLERGY 07/29/2008   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/03/2007   Essential hypertension 06/27/2007   Backache 06/19/2007   Osteoporosis 06/19/2007  Obstructive sleep apnea 06/19/2007   TRIGGER FINGER 04/18/2007   DIVERTICULOSIS, COLON 11/13/2006    Past Surgical History:  Procedure Laterality Date   ABDOMINAL HYSTERECTOMY  1978   BACK SURGERY  July 2012   BACTERIAL OVERGROWTH TEST N/A 05/05/2013   Procedure: BACTERIAL OVERGROWTH TEST;  Surgeon: Daneil Dolin, MD;  [facility];  Service: Endoscopy;  Laterality: N/A;  7:30   BIOPSY THYROID  2009   BRAIN SURGERY  11/2011   resection of meningioma   BREAST REDUCTION SURGERY  1994   CARDIAC CATHETERIZATION  05/10/2005   normal coronaries, normal LV systolic function and EF (Dr. Jackie Plum)   [place] Left 07/22/04   Dr. Aline Brochure   CATARACT EXTRACTION Bilateral    CHOLECYSTECTOMY  1984   COLONOSCOPY N/A 09/25/2012   [facility]:NOBSJGG diverticulosis.  colonic polyp-removed : tubular adenoma   CRANIOTOMY  11/23/2011   Procedure: CRANIOTOMY TUMOR EXCISION;  Surgeon: Hosie Spangle, MD;  Location: [facility];  Service:  Neurosurgery;  Laterality: N/A;  Craniotomy for tumor resection   ESOPHAGOGASTRODUODENOSCOPY  12/29/2010   [place]-Retained food in the esophagus and stomach, small hiatal hernia, status post Maloney dilation of the esophagus   ESOPHAGOGASTRODUODENOSCOPY N/A 09/25/2012   EZ[facility]:OQHUTMLY atonic baggy esophagus status post Maloney dilation 75 F. Hiatal hernia   GIVENS CAPSULE STUDY N/A 01/15/2013   NORMAL.    IR GENERIC HISTORICAL  03/17/2016   IR RADIOLOGIST EVAL & MGMT 03/17/2016 [facility]   LESION REMOVAL N/A 05/31/2015   Procedure: REMOVAL RIGHT AND LEFT LESIONS OF MANDIBLE;  Surgeon: Diona Browner, DDS;  Location: [facility];  Service: Oral Surgery;  Laterality: N/A;   MALONEY DILATION  12/29/2010   RMR;   NM MYOCAR PERF WALL MOTION  2006   "relavtiely normal" persantine, mild anterior thinning (breast attenuation artifact), no region of scar/ischemia   OVARIAN CYST REMOVAL     RECTOCELE REPAIR N/A 06/29/2015   Procedure: POSTERIOR REPAIR (RECTOCELE);  Surgeon: Jonnie Kind, MD;  Location: [facility];  Service: Gynecology;  Laterality: N/A;   REDUCTION MAMMAPLASTY Bilateral    SPINE SURGERY  09/29/2010   Dr. Rolena Infante   surgical excision of 3 tumors from right thigh and right buttock  and left upper thigh  2010   TOOTH EXTRACTION Bilateral 12/14/2014   Procedure: REMOVAL OF BILATERAL MANDIBULAR EXOSTOSES;  Surgeon: Diona Browner, DDS;  Location: [facility];  Service: Oral Surgery;  Laterality: Bilateral;   TRANSTHORACIC ECHOCARDIOGRAM  2010   EF 60-65%, mild conc LVH, grade 1 diastolic dysfunction; mildly calcified MV annulus with mildly thickened leaflets, mildly calcified MR annulus    OB History     Gravida  6   Para  1   Term  1   Preterm      AB  5   Living         SAB  5   IAB      Ectopic      Multiple      Live Births               Home Medications    Prior to Admission medications   Medication Sig Start Date End Date Taking? Authorizing Provider  benzonatate  (TESSALON) 100 MG capsule Take 1 capsule (100 mg total) by mouth every 8 (eight) hours. 11/20/21  Yes ,  K, PA-C  doxycycline (VIBRAMYCIN) 100 MG capsule Take 1 capsule (100 mg total) by mouth 2 (two) times daily. 11/20/21  Yes ,   K, PA-C  predniSONE (DELTASONE) 20 MG tablet Take 1 tablet (20 mg total) by mouth daily. 11/20/21  Yes ,  K, PA-C  ACCU-CHEK GUIDE test strip TEST BLOOD SUGAR FOUR TIMES DAILY  AND AS NEEDED 05/30/21   Philemon Kingdom, MD  Accu-Chek Softclix Lancets lancets TEST BLOOD SUGAR THREE TIMES DAILY AS DIRECTED 03/28/21   Philemon Kingdom, MD  acetaminophen (TYLENOL) 325 MG tablet Take 650 mg by mouth every 6 (six) hours as needed.    [provider]  albuterol (VENTOLIN HFA) 108 (90 Base) MCG/ACT inhaler INHALE 1 TO 2 PUFFS EVERY 6 HOURS AS NEEDED FOR WHEEZING, SHORTNESS OF BREATH Patient taking differently: Inhale 1-2 puffs into the lungs every 6 (six) hours as needed for wheezing or shortness of breath. 06/24/21   Deneise Lever, MD  Alcohol Swabs (DROPSAFE ALCOHOL PREP) 70 % PADS USE TO CLEAN FINGER PRIOR TO TESTING FOR BLOOD SUGAR  AS DIRECTED 05/20/21   Philemon Kingdom, MD  alendronate (FOSAMAX) 70 MG tablet TAKE 1 TABLET EVERY 7 DAYS ON AN EMPTY STOMACH WITH A FULL GLASS OF WATER Patient taking differently: Take 70 mg by mouth once a week. 11/07/21   [name], MD  amLODipine (NORVASC) 10 MG tablet TAKE 1 TABLET EVERY DAY Patient taking differently: Take 10 mg by mouth daily. 06/06/21   Imogene Burn, PA-C  ascorbic acid (VITAMIN C) 500 MG tablet Take 500 mg by mouth daily.    [provider]  aspirin EC 81 MG tablet Take 1 tablet (81 mg total) by mouth daily with breakfast. 12/08/18   Roxan Hockey, MD  Blood Glucose Calibration (ACCU-CHEK GUIDE CONTROL) LIQD USE AS DIRECTED 12/13/20   Philemon Kingdom, MD  blood glucose meter kit and supplies Dispense based on patient and insurance preference. Use up to four  times daily as directed. (FOR ICD-10 E10.9, E11.9). 06/27/19   Philemon Kingdom, MD  buPROPion (WELLBUTRIN XL) 150 MG 24 hr tablet Take 1 tablet (150 mg total) by mouth every morning. 10/12/21 10/12/22  Cloria Spring, MD  Calcium Carb-Cholecalciferol (CALTRATE 600+D3 SOFT) 600-20 MG-MCG CHEW Chew 1 tablet by mouth daily at 12 noon.    [provider]  cetirizine (ZYRTEC) 10 MG tablet Take 1 tablet (10 mg total) by mouth daily. 04/25/21   Renee Rival, FNP  Cholecalciferol (D3 PO) Take 1 tablet by mouth daily.    [provider]  Clobetasol Prop Emollient Base (CLOBETASOL PROPIONATE E) 0.05 % emollient cream Apply to affected area qd Patient taking differently: Apply 1 Application topically daily. 01/24/21   Lavonna Monarch, MD  Continuous Blood Gluc Sensor (FREESTYLE LIBRE 14 DAY SENSOR) MISC 1 each by Does not apply route every 14 (fourteen) days. Change every 2 weeks 06/24/19   Philemon Kingdom, MD  diclofenac Sodium (VOLTAREN) 1 % GEL APPLY 2 GRAMS TOPICALLY FOUR TIMES DAILY. Patient taking differently: Apply 2 g topically 4 (four) times daily. 11/04/21   Esterwood, Amy S, PA-C  Docusate Sodium (DSS) 100 MG CAPS Take 1 tablet by mouth in the morning and at bedtime.    [provider]  DROPLET PEN NEEDLES 31G X 8 MM MISC USE FOR INJECTING INSULIN 4 TIMES DAILY. 09/02/21   Philemon Kingdom, MD  ezetimibe (ZETIA) 10 MG tablet TAKE 1 TABLET EVERY DAY Patient taking differently: Take 10 mg by mouth daily. 06/22/21   [name], MD  insulin aspart (NOVOLOG FLEXPEN) 100 UNIT/ML FlexPen Inject 5 Units into the skin 3 (three)  times daily with meals. Give if eats 50% or more of meal. Patient taking differently: Inject 14 Units into the skin 3 (three) times daily with meals. Give if eats 50% or more of meal. 09/17/21   Johnson, Clanford L, MD  insulin glargine, 2 Unit Dial, (TOUJEO MAX SOLOSTAR) 300 UNIT/ML Solostar Pen Inject 20-25 Units into the skin daily. 09/29/21    Philemon Kingdom, MD  JARDIANCE 10 MG TABS tablet Take 10 mg by mouth daily. Patient not taking: Reported on 11/09/2021 10/31/21   [provider]  lactobacillus acidophilus (BACID) TABS tablet Take 2 tablets by mouth 3 (three) times daily.    [provider]  lamoTRIgine (LAMICTAL) 100 MG tablet Take 1 tablet (100 mg total) by mouth 2 (two) times daily. 10/12/21   Cloria Spring, MD  levothyroxine (SYNTHROID) 50 MCG tablet TAKE 1 TABLET EVERY DAY FOR 6 DAYS PER WEEK, 1/2 TABLET 1 DAY PER WEEK 07/07/21   Philemon Kingdom, MD  LORazepam (ATIVAN) 0.5 MG tablet Take 1 tablet (0.5 mg total) by mouth 2 (two) times daily. 10/12/21   Cloria Spring, MD  losartan (COZAAR) 50 MG tablet TAKE 1 TABLET EVERY DAY 10/07/21   [name], MD  meclizine (ANTIVERT) 25 MG tablet Take 1 tablet (25 mg total) by mouth 3 (three) times daily as needed for dizziness. 04/28/20   Noreene Larsson, NP  metoprolol tartrate (LOPRESSOR) 50 MG tablet TAKE 1 TABLET TWICE DAILY (NEED MD APPOINTMENT) Patient taking differently: Take 50 mg by mouth 2 (two) times daily. 08/29/21   Strader, Fransisco Hertz, PA-C  Misc. Devices (MATTRESS PAD) MISC FIRM MATTRESS PAD for hospital bed x 1 02/22/21   [name], MD  montelukast (SINGULAIR) 10 MG tablet TAKE 1 TABLET EVERY DAY Patient taking differently: Take 10 mg by mouth at bedtime. 05/20/21   [name], MD  Multiple Vitamins-Minerals (MULTIVITAMIN GUMMIES ADULT PO) Take 1 tablet by mouth daily.    [provider]  MYRBETRIQ 25 MG TB24 tablet TAKE 1 TABLET EVERY DAY 10/07/21   [name], MD  nystatin (MYCOSTATIN/NYSTOP) powder APPLY TO AFFECTED AREA 4 TIMES DAILY. Patient taking differently: Apply 1 Application topically in the morning, at noon, in the evening, and at bedtime. 09/12/21   [name], MD  Omega-3 Fatty Acids (FISH OIL PO) Take 1 capsule by mouth daily.    [provider]  ondansetron (ZOFRAN) 4 MG tablet  Take 1 tablet (4 mg total) by mouth every 8 (eight) hours as needed for nausea or vomiting. 07/19/21   Alvira Monday, FNP  oxyCODONE-acetaminophen (PERCOCET) 10-325 MG tablet Take 1 tablet by mouth every 8 (eight) hours as needed for pain. 11/01/20   [provider]  pregabalin (LYRICA) 75 MG capsule Take 1 capsule (75 mg total) by mouth daily. 10/20/16   Rexene Alberts, MD  RABEprazole (ACIPHEX) 20 MG tablet TAKE 1 TABLET TWICE DAILY 11/09/21   Pyrtle, Lajuan Lines, MD  RESTASIS 0.05 % ophthalmic emulsion Place 2 drops into both eyes daily. 07/29/20   [provider]  risperiDONE (RISPERDAL) 0.5 MG tablet Take 1 tablet (0.5 mg total) by mouth at bedtime. 10/12/21   Cloria Spring, MD  rosuvastatin (CRESTOR) 5 MG tablet TAKE 1 TABLET AT BEDTIME. 11/07/21   [name], MD  sertraline (ZOLOFT) 100 MG tablet Take 1 tablet (100 mg total) by mouth every morning. 10/12/21   Cloria Spring, MD  STIOLTO RESPIMAT 2.5-2.5 MCG/ACT AERS INHALE 2  PUFFS INTO THE LUNGS DAILY. Patient not taking: Reported on 11/09/2021 05/31/20   Parrett, Fonnie Mu, NP  traZODone (DESYREL) 150 MG tablet Take 1 tablet (150 mg total) by mouth at bedtime. 10/12/21   Cloria Spring, MD  vitamin E 180 MG (400 UNITS) capsule Take 400 Units by mouth daily.    [provider]    Family History Family History  Problem Relation Age of Onset   Heart attack Mother        HTN   Pneumonia Father    Kidney failure Father    Diabetes Father    Pancreatic cancer Sister    Cancer Sister        breast    Cancer Sister        pancreatic   Diabetes Brother    Hypertension Brother    Diabetes Brother    Alcohol abuse Maternal Uncle    Stroke Maternal Grandmother    Heart attack Maternal Grandfather    Hypertension Son    Sleep apnea Son    Colon cancer Neg Hx    Anesthesia problems Neg Hx    Hypotension Neg Hx    Malignant hyperthermia Neg Hx    Pseudochol deficiency Neg Hx    Breast cancer Neg Hx    Stomach  cancer Neg Hx     Social History Social History   Tobacco Use   Smoking status: Former    Packs/day: 0.25    Years: 30.00    Total pack years: 7.50    Types: Cigarettes    Quit date: 07/05/2021    Years since quitting: 0.3    Passive exposure: Past   Smokeless tobacco: Never   Tobacco comments:    3-4 a day [facility] 05/17/2021  Vaping Use   Vaping Use: Never used  Substance Use Topics   Alcohol use: No    Alcohol/week: 0.0 standard drinks of alcohol    Comment:     Drug use: No     Allergies   Iron, Milk (cow), Penicillins, Phenazopyridine, Phenazopyridine hcl, Cephalexin, Flonase [fluticasone], Milk-related compounds, and Phenazopyridine hcl   Review of Systems Review of Systems  Constitutional:  Positive for activity change and fatigue. Negative for appetite change and fever.  HENT:  Positive for congestion and sore throat. Negative for sinus pressure and sneezing.   Respiratory:  Positive for cough. Negative for shortness of breath.   Cardiovascular:  Negative for chest pain.  Gastrointestinal:  Negative for abdominal pain, diarrhea, nausea and vomiting.  Neurological:  Negative for dizziness, light-headedness and headaches.     Physical Exam Triage Vital Signs ED Triage Vitals [11/20/21 1014]  Enc Vitals Group     BP 137/70     Pulse Rate 66     Resp 20     Temp 98.7 F (37.1 C)     Temp Source Oral     SpO2 92 %     Weight      Height      Head Circumference      Peak Flow      Pain Score 4     Pain Loc      Pain Edu?      Excl. in [place]?    No data found.  Updated Vital Signs BP 137/70 (BP Location: Right Arm)   Pulse 66   Temp 98.7 F (37.1 C) (Oral)   Resp 20   SpO2 92%   Visual Acuity Right Eye Distance:  Left Eye Distance:   Bilateral Distance:    Right Eye Near:   Left Eye Near:    Bilateral Near:     Physical Exam Vitals reviewed.  Constitutional:      General: She is awake. She is not in acute distress.    Appearance: Normal  appearance. She is well-developed. She is not ill-appearing.     Comments: Very pleasant female appears stated age in no acute distress sitting on exam room table  HENT:     Head: Normocephalic and atraumatic.     Right Ear: Tympanic membrane, ear canal and external ear normal. Tympanic membrane is not erythematous or bulging.     Left Ear: Tympanic membrane, ear canal and external ear normal. Tympanic membrane is not erythematous or bulging.     Nose:     Right Sinus: No maxillary sinus tenderness or frontal sinus tenderness.     Left Sinus: No maxillary sinus tenderness or frontal sinus tenderness.     Mouth/Throat:     Pharynx: Uvula midline. Posterior oropharyngeal erythema present. No oropharyngeal exudate.  Cardiovascular:     Rate and Rhythm: Normal rate and regular rhythm.     Heart sounds: Normal heart sounds, S1 normal and S2 normal. No murmur heard. Pulmonary:     Effort: Pulmonary effort is normal.     Breath sounds: Rhonchi present. No wheezing or rales.     Comments: Scattered rhonchi partially clear with cough Psychiatric:        Behavior: Behavior is cooperative.      UC Treatments / Results  Labs (all labs ordered are listed, but only abnormal results are displayed) Labs Reviewed  RESP PANEL BY RT-PCR (FLU A&B, COVID) [id number]  CBC WITH DIFFERENTIAL/PLATELET  COMPREHENSIVE METABOLIC PANEL    EKG   Radiology DG Chest 2 View  Result Date: 11/20/2021 CLINICAL DATA:  Cough EXAM: CHEST - 2 VIEW COMPARISON:  11/09/2021 FINDINGS: Stable cardiomediastinal contours. Aortic atherosclerosis. Slightly low lung volumes. Streaky bibasilar opacities. Linear atelectasis within the right mid lung. No pleural effusion or pneumothorax. Degenerative changes of the thoracic spine. IMPRESSION: Slightly low lung volumes with streaky bibasilar opacities, likely atelectasis. Developing infection not excluded. Electronically Signed   By: Davina Poke D.O.   On: 11/20/2021 10:52     Procedures Procedures (including critical care time)  Medications Ordered in UC Medications - No data to display  Initial Impression / Assessment and Plan / UC Course  I have reviewed the triage vital signs and the nursing notes.  Pertinent labs & imaging results that were available during my care of the patient were reviewed by me and considered in my medical decision making (see chart for details).     Patient is well-appearing, afebrile, nontoxic, nontachycardic.  Her oxygen saturation is slightly low for her at 92% with her baseline of 97%.  X-ray was obtained that showed likely bilateral atelectasis but cannot rule out infiltrate.  Discussed that it may be worthwhile to go to the emergency room for further evaluation and management as she would qualify for admission if she has pneumonia since she is over 73 years old.  Patient declined this and stated she did not want to go to the ER.  Unclear if symptoms are related to COPD exacerbation versus viral illness.  We will treat for COPD exacerbation given cough and increased pain production.  Patient was prescribed doxycycline given medication allergies as well as 20 mg of prednisone for 4 days.  Discussed that she  is not to take NSAIDs with prednisone.  She does have a history of diabetes but has worked with her endocrinologist and will adjust her sliding scale insulin while on steroids.  She was given Tessalon for cough recommend she use Mucinex for additional symptom relief.  COVID testing was obtained today and results are pending.  We will contact her if any of this is positive.  Given her past medical history she would benefit from antiviral therapy.  Given her medications she would have to stop several of them in order to take Paxlovid and so I think she would do better on molnupiravir.  CBC and CMP were obtained today and if she has abnormal kidney function or significant leukocytosis she would need to go to the emergency room.  Recommend  that she follow-up with our clinic or primary care within 24 to 48 hours.  She does have an aide who accompanied her to the visit we will check in on her more frequently.  Discussed that if anything changes or worsens she was go to the ER immediately to which she expressed understanding.  Final Clinical Impressions(s) / UC Diagnoses   Final diagnoses:  Close exposure to COVID-19 virus  COPD exacerbation ([place])  Acute cough     Discharge Instructions      I am very concerned about you.  It is very important that you follow-up with either our clinic or your primary care tomorrow.  If anything worsens overnight you must go to the emergency room including chest pain, shortness of breath, weakness, nausea/vomiting, high fever.  Start antibiotics and prednisone.  Increase your insulin since you are taking prednisone as recommended by your endocrinologist.  Monitor your blood sugar closely.  Use Mucinex, and Tylenol for pain relief.  Use Tessalon for cough.  If anything worsens you must go to the ER as we discussed.     ED Prescriptions     Medication Sig Dispense Auth. Provider   doxycycline (VIBRAMYCIN) 100 MG capsule Take 1 capsule (100 mg total) by mouth 2 (two) times daily. 20 capsule ,  K, PA-C   predniSONE (DELTASONE) 20 MG tablet Take 1 tablet (20 mg total) by mouth daily. 4 tablet ,  K, PA-C   benzonatate (TESSALON) 100 MG capsule Take 1 capsule (100 mg total) by mouth every 8 (eight) hours. 21 capsule ,  K, PA-C      I have reviewed the PDMP during this encounter.   Terrilee Croak, PA-C 11/20/21 1111

## 2021-11-20 NOTE — ED Triage Notes (Addendum)
Pt reports fever,productive cough, body aches, fatigue since Friday. Pt reports known exposure on Monday and reports was seen recently in ED for dehydration.

## 2021-11-20 NOTE — Discharge Instructions (Signed)
I am very concerned about you.  It is very important that you follow-up with either our clinic or your primary care tomorrow.  If anything worsens overnight you must go to the emergency room including chest pain, shortness of breath, weakness, nausea/vomiting, high fever.  Start antibiotics and prednisone.  Increase your insulin since you are taking prednisone as recommended by your endocrinologist.  Monitor your blood sugar closely.  Use Mucinex, and Tylenol for pain relief.  Use Tessalon for cough.  If anything worsens you must go to the ER as we discussed.

## 2021-11-21 ENCOUNTER — Telehealth: Payer: Self-pay | Admitting: Family Medicine

## 2021-11-21 ENCOUNTER — Ambulatory Visit
Admission: EM | Admit: 2021-11-21 | Discharge: 2021-11-21 | Disposition: A | Discharge status: Home / Self Care | Payer: Medicare HMO | Attending: Nurse Practitioner | Admitting: Nurse Practitioner

## 2021-11-21 ENCOUNTER — Ambulatory Visit: Payer: Medicare HMO | Admitting: Adult Health

## 2021-11-21 DIAGNOSIS — R739 Hyperglycemia, unspecified: Secondary | ICD-10-CM

## 2021-11-21 DIAGNOSIS — E1159 Type 2 diabetes mellitus with other circulatory complications: Secondary | ICD-10-CM | POA: Diagnosis not present

## 2021-11-21 DIAGNOSIS — R059 Cough, unspecified: Secondary | ICD-10-CM

## 2021-11-21 DIAGNOSIS — J441 Chronic obstructive pulmonary disease with (acute) exacerbation: Secondary | ICD-10-CM

## 2021-11-21 LAB — POCT FASTING CBG KUC MANUAL ENTRY
POCT Glucose (KUC): 126 mg/dL — AB (ref 70–99)
POCT Glucose (KUC): 61 mg/dL — AB (ref 70–99)

## 2021-11-21 MED ORDER — IPRATROPIUM-ALBUTEROL 0.5-2.5 (3) MG/3ML IN SOLN
3.0000 mL | Freq: Once | RESPIRATORY_TRACT | Status: AC
  Administered 2021-11-21: 3 mL via RESPIRATORY_TRACT

## 2021-11-21 NOTE — Telephone Encounter (Signed)
Pt called stating she was seen yesterday at [facility] and was dx with pneumonia & possible covid (pending test results). States she was given an abx, prednisone and tessalon pearls. States the pearls are not helping her. She states her blood sugar is 497 and BP is 163/77. Wants to know what to do?

## 2021-11-21 NOTE — Telephone Encounter (Signed)
Patient aware of below recommendations, patient states she has no way I advised her to call 911.

## 2021-11-21 NOTE — ED Triage Notes (Signed)
Pt reports cough and headache is worse since the last time she was seen on 11/20/2021; blood sugar was 497 this morning.

## 2021-11-21 NOTE — Discharge Instructions (Addendum)
Continue the medications you are currently prescribed. Increase fluids and allow for plenty of rest. Recommend increasing your fluid intake, preferably water, while your blood sugar is elevated.  Drinking more water will help with your blood glucose level. Sleep sitting up or elevated on pillows while cough symptoms persist. Recommend using a humidifier in your bedroom during sleep while cough symptoms persist. Go to the emergency department immediately if you develop a blood glucose level higher than 600, worsening shortness of breath, difficulty breathing, trouble breathing, or inability to speak in a complete sentence. Please follow-up with your primary care physician's office within the next 24 to 48 hours for reevaluation.

## 2021-11-21 NOTE — ED Provider Notes (Signed)
[facility]    CSN: [id number] Arrival date & time: 11/21/21  1337      History   Chief Complaint Chief Complaint  Patient presents with   Cough    HPI [name] is a 71 y.o. female.   The history is provided by the patient.   Patient presents for continued cough and headache since being seen in this clinic on 11/20/2021.  Patient reports headache worsens with coughing.  Patient also reports that her blood sugar was 497 this morning.  Patient was seen in this clinic 1 day ago for complaints of cough, fatigue, malaise, and congestion.  At that time, she denied chest pain, shortness of breath, difficulty breathing, or trouble breathing.  Patient also had a COVID test performed, which was negative per review of her chart today.  Patient does have a reported history of COPD.  Patient was using her albuterol inhaler prior to coming to the clinic on 11/20/2021 with minimal relief.  Patient was prescribed doxycycline, benzonatate and prednisone for her symptoms.  Patient states she has taken 3 doses of doxycycline and does not feel any better.  She has taken 2 doses of prednisone.  CBC and CMP were collected on 11/20/2021, results are pending at this time.  The provider that saw the patient 1 day ago recommended that the patient go to the emergency department for further evaluation; however, patient declined.  Patient did reach out to her primary care physician's office, her PCP advised her to go to the emergency department.  Patient states she did not go to the emergency department because she did not want to wait, nor did her ride have time to wait on her.  At time Past Medical History:  Diagnosis Date   Allergy    Anemia    Anemia in chronic kidney disease (CKD) 08/10/2021   Anemia in chronic kidney disease (CKD) 10/12/2021   Anxiety    takes Ativan daily   Arthritis    Assistance needed for mobility    Bipolar disorder ([place])    takes Risperdal nightly   Blood transfusion     Brain tumor ([place])    Cancer ([place])    In her gum   Carpal tunnel syndrome of right wrist 05/23/2011   Cervical disc disorder with radiculopathy of cervical region 10/31/2012   Chronic back pain    Chronic idiopathic constipation    Chronic neck and back pain    Colon polyps    COPD (chronic obstructive pulmonary disease) with chronic bronchitis ([place]) 09/16/2013   Office Spirometry 10/30/2013-submaximal effort based on appearance of loop and curve. Numbers would fit with severe restriction but her physiologic capability may be better than this. FVC 0.91/44%, and 10.74/45%, FEV1/FVC 0.81, FEF 25-75% 1.43/69%     Diabetes mellitus    Type II   Diverticulosis    TCS 9/08 by Dr. Delfin Edis for diarrhea . Bx for micro scopic colitis negative.    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/63/8466   Metabolic encephalopathy 59/93/5701   Migraines    chronic headaches   Mononeuritis lower limb    Narcolepsy    Osteoporosis    Pancreatitis 2006   due to Depakote with normal EUS    Paralysis (HCC)    Schatzki's ring    non critical / EGD with ED 8/2011with RMR   Seizures ([place])    takes Lamictal daily.Last seizure 3 yrs ago   Sleep apnea    on CPAP   Small bowel obstruction (HCC)    Stroke ([place])    left sided weakness, speech changes   Tubular adenoma of colon     Patient Active Problem List   Diagnosis Date Noted   Anemia in chronic kidney disease (CKD) 10/12/2021   Subconjunctival hemorrhage of left eye 10/02/2021    Dehydration 09/25/2021   Encounter for support and coordination of transition of care 09/25/2021   Acute renal failure superimposed on stage 3a chronic kidney disease ([place]) 09/23/2021   AKI (acute kidney injury) ([place]) 09/16/2021   CHF (congestive heart failure) ([place]) 09/16/2021   Abnormal EKG 09/16/2021   Urinary frequency 09/01/2021   Anemia of chronic disease 08/10/2021   Generalized pain 08/04/2021   Sciatica, right side 07/19/2021   Breast pain, left 07/10/2021   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/10/2021   Atherosclerosis of aorta ([place]) 07/10/2021   Generalized weakness 05/12/2021   Advanced care planning/counseling discussion 12/23/2020   Head trauma, initial encounter 12/20/2020   Trigger finger, right 12/12/2020   Confusion 11/24/2020   Blurry vision 11/18/2020   Cervical radiculopathy 07/09/2020   Closed fracture of lateral malleolus of right fibula 02/27/2020   Headache 02/19/2020   Tubular adenoma of colon 02/09/2019   Benign neoplasm of cerebral meninges ([place]) 11/12/2018   Benign meningioma of brain ([place]) 10/31/2018   Osteoarthritis of both hips 07/06/2018   Lipoma of extremity 12/31/2017   Impingement syndrome of left shoulder region 12/27/2017   Weakness generalized    Leg weakness, bilateral 10/28/2017   Lumbar spondylosis with myelopathy 10/12/2017   Numbness of hand 10/10/2017   Chronic neck pain 07/25/2017   Chronic pain syndrome 07/25/2017   At risk for cardiovascular event 77/93/9030   Acute metabolic encephalopathy 12/03/3005   Diabetic polyneuropathy associated with type 2 diabetes mellitus ([place]) 05/19/2016   Tobacco user 04/24/2016   Obesity (BMI 30.0-34.9) 04/24/2016   History of palpitations 08/09/2015   Labile hypertension 08/03/2015   Normal coronary arteries 08/03/2015   Dizziness 07/15/2015   Left-sided low back pain with left-sided sciatica 06/27/2015   Multinodular goiter  05/06/2015   Rectocele, female 04/27/2015   Anal sphincter incontinence 04/27/2015   Pelvic relaxation due to rectocele 03/30/2015   Hyperkalemia 02/22/2015   Pulmonary hypertension ([place]) 02/22/2015   Posterior chest pain 02/21/2015   Episodic cigarette smoking dependence 01/11/2015   Migraine without aura and without status migrainosus, not intractable 07/02/2014   Flatulence 02/18/2014   Microcytic anemia 02/18/2014   Acquired hypothyroidism 08/16/2013   Gastroparesis 04/28/2013   Nicotine dependence 03/09/2013   Seizure disorder ([place]) 01/19/2013   Displacement of cervical intervertebral disc without myelopathy 12/13/2012   Bursitis of shoulder 12/13/2012   UTI (urinary tract infection) 12/01/2012   Cervical disc disorder with radiculopathy of cervical region 10/31/2012   Solitary pulmonary nodule 08/19/2012   Anemia 07/05/2012   Hypersomnia disorder related to a known organic  factor 06/11/2012   Pruritus 04/18/2012   Meningioma ([place]) 11/19/2011   Mononeuritis leg 10/25/2011   Carpal tunnel syndrome of right wrist 05/23/2011   Chronic pain of right hand 05/04/2011   Polypharmacy 04/28/2011   Mood disorder ([place]) 04/28/2011   Constipation 04/13/2011   Recurrent falls 12/12/2010   Oropharyngeal dysphagia 07/12/2010   Urinary incontinence 12/16/2009   HEARING LOSS 10/26/2009   DMII (diabetes mellitus, type 2) ([place]) 07/07/2009   Mixed hyperlipidemia 12/11/2008   IBS 12/11/2008   GERD 07/29/2008   MILK PRODUCTS ALLERGY 07/29/2008   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/03/2007   Essential hypertension 06/27/2007   Backache 06/19/2007   Osteoporosis 06/19/2007   Obstructive sleep apnea 06/19/2007   TRIGGER FINGER 04/18/2007   DIVERTICULOSIS, COLON 11/13/2006    Past Surgical History:  Procedure Laterality Date   ABDOMINAL HYSTERECTOMY  1978   BACK SURGERY  July 2012   BACTERIAL OVERGROWTH TEST N/A 05/05/2013   Procedure: BACTERIAL OVERGROWTH  TEST;  Surgeon: Daneil Dolin, MD;  [facility];  Service: Endoscopy;  Laterality: N/A;  7:30   BIOPSY THYROID  2009   BRAIN SURGERY  11/2011   resection of meningioma   BREAST REDUCTION SURGERY  1994   CARDIAC CATHETERIZATION  05/10/2005   normal coronaries, normal LV systolic function and EF (Dr. Jackie Plum)   [place] Left 07/22/04   Dr. Aline Brochure   CATARACT EXTRACTION Bilateral    CHOLECYSTECTOMY  1984   COLONOSCOPY N/A 09/25/2012   TRR:NHAFBXU diverticulosis.  colonic polyp-removed : tubular adenoma   CRANIOTOMY  11/23/2011   Procedure: CRANIOTOMY TUMOR EXCISION;  Surgeon: Hosie Spangle, MD;  Location: [facility];  Service: Neurosurgery;  Laterality: N/A;  Craniotomy for tumor resection   ESOPHAGOGASTRODUODENOSCOPY  12/29/2010   [place]-Retained food in the esophagus and stomach, small hiatal hernia, status post Maloney dilation of the esophagus   ESOPHAGOGASTRODUODENOSCOPY N/A 09/25/2012   XYB:FXOVANVB atonic baggy esophagus status post Maloney dilation 3 F. Hiatal hernia   GIVENS CAPSULE STUDY N/A 01/15/2013   NORMAL.    IR GENERIC HISTORICAL  03/17/2016   IR RADIOLOGIST EVAL & MGMT 03/17/2016 [facility]   LESION REMOVAL N/A 05/31/2015   Procedure: REMOVAL RIGHT AND LEFT LESIONS OF MANDIBLE;  Surgeon: Diona Browner, DDS;  Location: [facility];  Service: Oral Surgery;  Laterality: N/A;   MALONEY DILATION  12/29/2010   RMR;   NM MYOCAR PERF WALL MOTION  2006   "relavtiely normal" persantine, mild anterior thinning (breast attenuation artifact), no region of scar/ischemia   OVARIAN CYST REMOVAL     RECTOCELE REPAIR N/A 06/29/2015   Procedure: POSTERIOR REPAIR (RECTOCELE);  Surgeon: Jonnie Kind, MD;  Location: [facility];  Service: Gynecology;  Laterality: N/A;   REDUCTION MAMMAPLASTY Bilateral    SPINE SURGERY  09/29/2010   Dr. Rolena Infante   surgical excision of 3 tumors from right thigh and right buttock  and left upper thigh  2010   TOOTH EXTRACTION Bilateral  12/14/2014   Procedure: REMOVAL OF BILATERAL MANDIBULAR EXOSTOSES;  Surgeon: Diona Browner, DDS;  Location: [facility];  Service: Oral Surgery;  Laterality: Bilateral;   TRANSTHORACIC ECHOCARDIOGRAM  2010   EF 60-65%, mild conc LVH, grade 1 diastolic dysfunction; mildly calcified MV annulus with mildly thickened leaflets, mildly calcified MR annulus    OB History     Gravida  6   Para  1   Term  1   Preterm  AB  5   Living         SAB  5   IAB      Ectopic      Multiple      Live Births               Home Medications    Prior to Admission medications   Medication Sig Start Date End Date Taking? Authorizing Provider  ACCU-CHEK GUIDE test strip TEST BLOOD SUGAR FOUR TIMES DAILY  AND AS NEEDED 05/30/21   Philemon Kingdom, MD  Accu-Chek Softclix Lancets lancets TEST BLOOD SUGAR THREE TIMES DAILY AS DIRECTED 03/28/21   Philemon Kingdom, MD  acetaminophen (TYLENOL) 325 MG tablet Take 650 mg by mouth every 6 (six) hours as needed.    [provider]  albuterol (VENTOLIN HFA) 108 (90 Base) MCG/ACT inhaler INHALE 1 TO 2 PUFFS EVERY 6 HOURS AS NEEDED FOR WHEEZING, SHORTNESS OF BREATH Patient taking differently: Inhale 1-2 puffs into the lungs every 6 (six) hours as needed for wheezing or shortness of breath. 06/24/21   Deneise Lever, MD  Alcohol Swabs (DROPSAFE ALCOHOL PREP) 70 % PADS USE TO CLEAN FINGER PRIOR TO TESTING FOR BLOOD SUGAR  AS DIRECTED 05/20/21   Philemon Kingdom, MD  alendronate (FOSAMAX) 70 MG tablet TAKE 1 TABLET EVERY 7 DAYS ON AN EMPTY STOMACH WITH A FULL GLASS OF WATER Patient taking differently: Take 70 mg by mouth once a week. 11/07/21   [name], MD  amLODipine (NORVASC) 10 MG tablet TAKE 1 TABLET EVERY DAY Patient taking differently: Take 10 mg by mouth daily. 06/06/21   Imogene Burn, PA-C  ascorbic acid (VITAMIN C) 500 MG tablet Take 500 mg by mouth daily.    [provider]  aspirin EC 81 MG tablet Take 1 tablet (81 mg  total) by mouth daily with breakfast. 12/08/18   Emokpae, Courage, MD  benzonatate (TESSALON) 100 MG capsule Take 1 capsule (100 mg total) by mouth every 8 (eight) hours. 11/20/21   Raspet, Derry Skill, PA-C  Blood Glucose Calibration (ACCU-CHEK GUIDE CONTROL) LIQD USE AS DIRECTED 12/13/20   Philemon Kingdom, MD  blood glucose meter kit and supplies Dispense based on patient and insurance preference. Use up to four times daily as directed. (FOR ICD-10 E10.9, E11.9). 06/27/19   Philemon Kingdom, MD  buPROPion (WELLBUTRIN XL) 150 MG 24 hr tablet Take 1 tablet (150 mg total) by mouth every morning. 10/12/21 10/12/22  Cloria Spring, MD  Calcium Carb-Cholecalciferol (CALTRATE 600+D3 SOFT) 600-20 MG-MCG CHEW Chew 1 tablet by mouth daily at 12 noon.    [provider]  cetirizine (ZYRTEC) 10 MG tablet Take 1 tablet (10 mg total) by mouth daily. 04/25/21   Renee Rival, FNP  Cholecalciferol (D3 PO) Take 1 tablet by mouth daily.    [provider]  Clobetasol Prop Emollient Base (CLOBETASOL PROPIONATE E) 0.05 % emollient cream Apply to affected area qd Patient taking differently: Apply 1 Application topically daily. 01/24/21   Lavonna Monarch, MD  Continuous Blood Gluc Sensor (FREESTYLE LIBRE 14 DAY SENSOR) MISC 1 each by Does not apply route every 14 (fourteen) days. Change every 2 weeks 06/24/19   Philemon Kingdom, MD  diclofenac Sodium (VOLTAREN) 1 % GEL APPLY 2 GRAMS TOPICALLY FOUR TIMES DAILY. Patient taking differently: Apply 2 g topically 4 (four) times daily. 11/04/21   Esterwood, Amy S, PA-C  Docusate Sodium (DSS) 100 MG CAPS Take 1 tablet by mouth in the morning and  at bedtime.    [provider]  doxycycline (VIBRAMYCIN) 100 MG capsule Take 1 capsule (100 mg total) by mouth 2 (two) times daily. 11/20/21   Raspet, Erin K, PA-C  DROPLET PEN NEEDLES 31G X 8 MM MISC USE FOR INJECTING INSULIN 4 TIMES DAILY. 09/02/21   Philemon Kingdom, MD  ezetimibe (ZETIA) 10 MG tablet TAKE 1  TABLET EVERY DAY Patient taking differently: Take 10 mg by mouth daily. 06/22/21   [name], MD  insulin aspart (NOVOLOG FLEXPEN) 100 UNIT/ML FlexPen Inject 5 Units into the skin 3 (three) times daily with meals. Give if eats 50% or more of meal. Patient taking differently: Inject 14 Units into the skin 3 (three) times daily with meals. Give if eats 50% or more of meal. 09/17/21   Johnson, Clanford L, MD  insulin glargine, 2 Unit Dial, (TOUJEO MAX SOLOSTAR) 300 UNIT/ML Solostar Pen Inject 20-25 Units into the skin daily. 09/29/21   Philemon Kingdom, MD  JARDIANCE 10 MG TABS tablet Take 10 mg by mouth daily. Patient not taking: Reported on 11/09/2021 10/31/21   [provider]  lactobacillus acidophilus (BACID) TABS tablet Take 2 tablets by mouth 3 (three) times daily.    [provider]  lamoTRIgine (LAMICTAL) 100 MG tablet Take 1 tablet (100 mg total) by mouth 2 (two) times daily. 10/12/21   Cloria Spring, MD  levothyroxine (SYNTHROID) 50 MCG tablet TAKE 1 TABLET EVERY DAY FOR 6 DAYS PER WEEK, 1/2 TABLET 1 DAY PER WEEK 07/07/21   Philemon Kingdom, MD  LORazepam (ATIVAN) 0.5 MG tablet Take 1 tablet (0.5 mg total) by mouth 2 (two) times daily. 10/12/21   Cloria Spring, MD  losartan (COZAAR) 50 MG tablet TAKE 1 TABLET EVERY DAY 10/07/21   [name], MD  meclizine (ANTIVERT) 25 MG tablet Take 1 tablet (25 mg total) by mouth 3 (three) times daily as needed for dizziness. 04/28/20   Noreene Larsson, NP  metoprolol tartrate (LOPRESSOR) 50 MG tablet TAKE 1 TABLET TWICE DAILY (NEED MD APPOINTMENT) Patient taking differently: Take 50 mg by mouth 2 (two) times daily. 08/29/21   Strader, Fransisco Hertz, PA-C  Misc. Devices (MATTRESS PAD) MISC FIRM MATTRESS PAD for hospital bed x 1 02/22/21   [name], MD  montelukast (SINGULAIR) 10 MG tablet TAKE 1 TABLET EVERY DAY Patient taking differently: Take 10 mg by mouth at bedtime. 05/20/21   [name], MD  Multiple  Vitamins-Minerals (MULTIVITAMIN GUMMIES ADULT PO) Take 1 tablet by mouth daily.    [provider]  MYRBETRIQ 25 MG TB24 tablet TAKE 1 TABLET EVERY DAY 10/07/21   [name], MD  nystatin (MYCOSTATIN/NYSTOP) powder APPLY TO AFFECTED AREA 4 TIMES DAILY. Patient taking differently: Apply 1 Application topically in the morning, at noon, in the evening, and at bedtime. 09/12/21   [name], MD  Omega-3 Fatty Acids (FISH OIL PO) Take 1 capsule by mouth daily.    [provider]  ondansetron (ZOFRAN) 4 MG tablet Take 1 tablet (4 mg total) by mouth every 8 (eight) hours as needed for nausea or vomiting. 07/19/21   Alvira Monday, FNP  oxyCODONE-acetaminophen (PERCOCET) 10-325 MG tablet Take 1 tablet by mouth every 8 (eight) hours as needed for pain. 11/01/20   [provider]  predniSONE (DELTASONE) 20 MG tablet Take 1 tablet (20 mg total) by mouth daily. 11/20/21   Raspet, Derry Skill, PA-C  pregabalin (LYRICA) 75 MG capsule Take 1 capsule (75  mg total) by mouth daily. 10/20/16   Rexene Alberts, MD  RABEprazole (ACIPHEX) 20 MG tablet TAKE 1 TABLET TWICE DAILY 11/09/21   Pyrtle, Lajuan Lines, MD  RESTASIS 0.05 % ophthalmic emulsion Place 2 drops into both eyes daily. 07/29/20   [provider]  risperiDONE (RISPERDAL) 0.5 MG tablet Take 1 tablet (0.5 mg total) by mouth at bedtime. 10/12/21   Cloria Spring, MD  rosuvastatin (CRESTOR) 5 MG tablet TAKE 1 TABLET AT BEDTIME. 11/07/21   [name], MD  sertraline (ZOLOFT) 100 MG tablet Take 1 tablet (100 mg total) by mouth every morning. 10/12/21   Cloria Spring, MD  STIOLTO RESPIMAT 2.5-2.5 MCG/ACT AERS INHALE 2 PUFFS INTO THE LUNGS DAILY. Patient not taking: Reported on 11/09/2021 05/31/20   Parrett, Fonnie Mu, NP  traZODone (DESYREL) 150 MG tablet Take 1 tablet (150 mg total) by mouth at bedtime. 10/12/21   Cloria Spring, MD  vitamin E 180 MG (400 UNITS) capsule Take 400 Units by mouth daily.    [provider]     Family History Family History  Problem Relation Age of Onset   Heart attack Mother        HTN   Pneumonia Father    Kidney failure Father    Diabetes Father    Pancreatic cancer Sister    Cancer Sister        breast    Cancer Sister        pancreatic   Diabetes Brother    Hypertension Brother    Diabetes Brother    Alcohol abuse Maternal Uncle    Stroke Maternal Grandmother    Heart attack Maternal Grandfather    Hypertension Son    Sleep apnea Son    Colon cancer Neg Hx    Anesthesia problems Neg Hx    Hypotension Neg Hx    Malignant hyperthermia Neg Hx    Pseudochol deficiency Neg Hx    Breast cancer Neg Hx    Stomach cancer Neg Hx     Social History Social History   Tobacco Use   Smoking status: Former    Packs/day: 0.25    Years: 30.00    Total pack years: 7.50    Types: Cigarettes    Quit date: 07/05/2021    Years since quitting: 0.3    Passive exposure: Past   Smokeless tobacco: Never   Tobacco comments:    3-4 a day MRC 05/17/2021  Vaping Use   Vaping Use: Never used  Substance Use Topics   Alcohol use: No    Alcohol/week: 0.0 standard drinks of alcohol    Comment:     Drug use: No     Allergies   Iron, Milk (cow), Penicillins, Phenazopyridine, Phenazopyridine hcl, Cephalexin, Flonase [fluticasone], Milk-related compounds, and Phenazopyridine hcl   Review of Systems Review of Systems Per HPI  Physical Exam Triage Vital Signs ED Triage Vitals  Enc Vitals Group     BP 11/21/21 1422 (!) 158/65     Pulse Rate 11/21/21 1422 65     Resp 11/21/21 1422 (!) 28     Temp 11/21/21 1422 99.1 F (37.3 C)     Temp Source 11/21/21 1422 Oral     SpO2 11/21/21 1422 96 %     Weight --      Height --      Head Circumference --      Peak Flow --      Pain Score 11/21/21 1420  8     Pain Loc --      Pain Edu? --      Excl. in [place]? --    No data found.  Updated Vital Signs BP (!) 158/65 (BP Location: Right Arm)   Pulse 65   Temp 99.1 F (37.3  C) (Oral)   Resp (!) 28   SpO2 96%   Visual Acuity Right Eye Distance:   Left Eye Distance:   Bilateral Distance:    Right Eye Near:   Left Eye Near:    Bilateral Near:     Physical Exam Vitals and nursing note reviewed.  Constitutional:      General: She is not in acute distress.    Appearance: Normal appearance.  HENT:     Head: Normocephalic.  Eyes:     Extraocular Movements: Extraocular movements intact.     Conjunctiva/sclera: Conjunctivae normal.     Pupils: Pupils are equal, round, and reactive to light.  Cardiovascular:     Rate and Rhythm: Normal rate and regular rhythm.     Pulses: Normal pulses.     Heart sounds: Normal heart sounds.  Pulmonary:     Effort: Pulmonary effort is normal. No respiratory distress.     Breath sounds: Decreased air movement present. Examination of the right-lower field reveals decreased breath sounds. Examination of the left-lower field reveals decreased breath sounds. Decreased breath sounds and wheezing present. No rales.     Comments: DuoNeb given for decreased breath sounds and wheezing.  Post neb, patient with increased wheezing, reports overall improvement at this time.  Second DuoNeb given.  Post neb, patient with decreased wheezing, continues to experience wheezing on the posterior lobes.  Increased air movement noted. Abdominal:     General: Bowel sounds are normal.     Palpations: Abdomen is soft.  Musculoskeletal:     Cervical back: Normal range of motion.  Lymphadenopathy:     Cervical: No cervical adenopathy.  Skin:    General: Skin is warm and dry.  Neurological:     General: No focal deficit present.     Mental Status: She is alert and oriented to person, place, and time.  Psychiatric:        Mood and Affect: Mood normal.      UC Treatments / Results  Labs (all labs ordered are listed, but only abnormal results are displayed) Labs Reviewed  POCT FASTING CBG [facility] MANUAL ENTRY - Abnormal; Notable for the  following components:      Result Value   POCT Glucose ([facility]) 61 (*)    All other components within normal limits  POCT FASTING CBG [facility] MANUAL ENTRY - Abnormal; Notable for the following components:   POCT Glucose ([facility]) 126 (*)    All other components within normal limits    EKG   Radiology DG Chest 2 View  Result Date: 11/20/2021 CLINICAL DATA:  Cough EXAM: CHEST - 2 VIEW COMPARISON:  11/09/2021 FINDINGS: Stable cardiomediastinal contours. Aortic atherosclerosis. Slightly low lung volumes. Streaky bibasilar opacities. Linear atelectasis within the right mid lung. No pleural effusion or pneumothorax. Degenerative changes of the thoracic spine. IMPRESSION: Slightly low lung volumes with streaky bibasilar opacities, likely atelectasis. Developing infection not excluded. Electronically Signed   By: Davina Poke D.O.   On: 11/20/2021 10:52    Procedures Procedures (including critical care time)  Medications Ordered in UC Medications  ipratropium-albuterol (DUONEB) 0.5-2.5 (3) MG/3ML nebulizer solution 3 mL (3 mLs Nebulization Given 11/21/21 1452)  ipratropium-albuterol (  DUONEB) 0.5-2.5 (3) MG/3ML nebulizer solution 3 mL (3 mLs Nebulization Given 11/21/21 1518)    Initial Impression / Assessment and Plan / UC Course  I have reviewed the triage vital signs and the nursing notes.  Pertinent labs & imaging results that were available during my care of the patient were reviewed by me and considered in my medical decision making (see chart for details).  Patient presents for complaints of cough and headache.  Patient was seen in this clinic on 11/20/2021 and was diagnosed for COPD exacerbation with acute cough.  Today, patient presents for worsening cough and headache.  She also has elevated blood glucose of 497.  On exam, patient had decreased air movement throughout.  She was in mild distress due to the persistent cough.  2 DuoNebs were given, patient reports good relief.  She is in no acute  distress at this time, coughing has also decreased.  Symptoms are consistent with a COPD exacerbation.  Patient was advised to continue prednisone that was previously prescribed.  Will not change the amount of the medication as patient is diabetic, and the steroids are impacting her blood sugar.  With regard to her blood glucose levels, patient advised that if her blood glucose does not improve or exceeds 600, she should go to the emergency department immediately for further evaluation.  Patient was advised to follow-up with her primary care physician's office within the next 24 to 48 hours for reevaluation.  Patient was given strict indication to go to the emergency department if she develops worsening cough, shortness of breath, difficulty breathing, trouble breathing, or the inability to speak in a complete sentence.  In the interim, patient was advised to continue the doxycycline she was also prescribed.  We will send patient home with a nebulizer machine and nebulizer solution.  Patient verbalizes understanding of instructions provided.  All questions were answered.  At time of discharge, patient's fingerstick blood sugar was rechecked, blood sugar was 61, patient was given nourishment.  On recheck, FSBS was 126.  Final Clinical Impressions(s) / UC Diagnoses   Final diagnoses:  COPD exacerbation (HCC)  Cough, unspecified type  Hyperglycemia     Discharge Instructions      Continue the medications you are currently prescribed. Increase fluids and allow for plenty of rest. Recommend increasing your fluid intake, preferably water, while your blood sugar is elevated.  Drinking more water will help with your blood glucose level. Sleep sitting up or elevated on pillows while cough symptoms persist. Recommend using a humidifier in your bedroom during sleep while cough symptoms persist. Go to the emergency department immediately if you develop a blood glucose level higher than 600, worsening  shortness of breath, difficulty breathing, trouble breathing, or inability to speak in a complete sentence. Please follow-up with your primary care physician's office within the next 24 to 48 hours for reevaluation.     ED Prescriptions   None    PDMP not reviewed this encounter.   Tish Men, NP 11/21/21 1614

## 2021-11-21 NOTE — Telephone Encounter (Signed)
New order placed for pt's DME to be changed to [facility] for CPAP due to having [employer] insurance. Nothing further needed.

## 2021-11-22 ENCOUNTER — Telehealth: Payer: Medicare HMO | Admitting: Family Medicine

## 2021-11-22 LAB — CBC WITH DIFFERENTIAL/PLATELET
Basophils Absolute: 0 10*3/uL (ref 0.0–0.2)
Basos: 0 %
EOS (ABSOLUTE): 0.4 10*3/uL (ref 0.0–0.4)
Eos: 4 %
Hematocrit: 35.4 % (ref 34.0–46.6)
Hemoglobin: 10.3 g/dL — ABNORMAL LOW (ref 11.1–15.9)
Immature Grans (Abs): 0.1 10*3/uL (ref 0.0–0.1)
Immature Granulocytes: 1 %
Lymphocytes Absolute: 2.1 10*3/uL (ref 0.7–3.1)
Lymphs: 20 %
MCH: 22.6 pg — ABNORMAL LOW (ref 26.6–33.0)
MCHC: 29.1 g/dL — ABNORMAL LOW (ref 31.5–35.7)
MCV: 78 fL — ABNORMAL LOW (ref 79–97)
Monocytes Absolute: 1.2 10*3/uL — ABNORMAL HIGH (ref 0.1–0.9)
Monocytes: 11 %
Neutrophils Absolute: 6.8 10*3/uL (ref 1.4–7.0)
Neutrophils: 64 %
Platelets: 305 10*3/uL (ref 150–450)
RBC: 4.56 x10E6/uL (ref 3.77–5.28)
RDW: 13.9 % (ref 11.7–15.4)
WBC: 10.5 10*3/uL (ref 3.4–10.8)

## 2021-11-22 LAB — COMPREHENSIVE METABOLIC PANEL
ALT: 20 IU/L (ref 0–32)
AST: 19 IU/L (ref 0–40)
Albumin/Globulin Ratio: 1.8 (ref 1.2–2.2)
Albumin: 4.2 g/dL (ref 3.8–4.8)
Alkaline Phosphatase: 80 IU/L (ref 44–121)
BUN/Creatinine Ratio: 18 (ref 12–28)
BUN: 17 mg/dL (ref 8–27)
Bilirubin Total: <0.2 mg/dL (ref 0.0–1.2)
CO2: 20 mmol/L (ref 20–29)
Calcium: 9 mg/dL (ref 8.7–10.3)
Chloride: 109 mmol/L — ABNORMAL HIGH (ref 96–106)
Creatinine, Ser: 0.93 mg/dL (ref 0.57–1.00)
Globulin, Total: 2.4 g/dL (ref 1.5–4.5)
Glucose: 36 mg/dL — CL (ref 70–99)
Potassium: 3.5 mmol/L (ref 3.5–5.2)
Sodium: 146 mmol/L — ABNORMAL HIGH (ref 134–144)
Total Protein: 6.6 g/dL (ref 6.0–8.5)
eGFR: 66 mL/min/{1.73_m2} (ref >59)

## 2021-11-22 NOTE — Telephone Encounter (Signed)
Rolling walker ordered thru [facility].

## 2021-11-22 NOTE — Telephone Encounter (Signed)
Pt called stating she is okay with getting another walker like she has so that it may be easier to get one.    [place]

## 2021-11-23 ENCOUNTER — Telehealth: Payer: Self-pay | Admitting: Internal Medicine

## 2021-11-23 DIAGNOSIS — R809 Proteinuria, unspecified: Secondary | ICD-10-CM | POA: Diagnosis not present

## 2021-11-23 DIAGNOSIS — I129 Hypertensive chronic kidney disease with stage 1 through stage 4 chronic kidney disease, or unspecified chronic kidney disease: Secondary | ICD-10-CM | POA: Diagnosis not present

## 2021-11-23 DIAGNOSIS — E870 Hyperosmolality and hypernatremia: Secondary | ICD-10-CM | POA: Diagnosis not present

## 2021-11-23 DIAGNOSIS — E1129 Type 2 diabetes mellitus with other diabetic kidney complication: Secondary | ICD-10-CM | POA: Diagnosis not present

## 2021-11-23 DIAGNOSIS — E1122 Type 2 diabetes mellitus with diabetic chronic kidney disease: Secondary | ICD-10-CM | POA: Diagnosis not present

## 2021-11-23 DIAGNOSIS — N189 Chronic kidney disease, unspecified: Secondary | ICD-10-CM | POA: Diagnosis not present

## 2021-11-23 DIAGNOSIS — Z6832 Body mass index (BMI) 32.0-32.9, adult: Secondary | ICD-10-CM | POA: Diagnosis not present

## 2021-11-23 DIAGNOSIS — I5032 Chronic diastolic (congestive) heart failure: Secondary | ICD-10-CM | POA: Diagnosis not present

## 2021-11-23 NOTE — Telephone Encounter (Signed)
Called and spoke to patient.  She stated that she received a nebulizer machine from urgent care but was told to contact our office for albuterol solution Rx.  Currently being tx with doxycyline for URI.   C/o prod cough with light yellow sputum, wheezing and increased SOB with exertion.  She is using albuterol HFA Q6H and stiolto once daily.   Dr. Valeta Harms, please advise. CY is unavailable.

## 2021-11-23 NOTE — Telephone Encounter (Signed)
Lm for patient.  

## 2021-11-23 NOTE — Telephone Encounter (Signed)
Patient called to request the medication for her nebulizer that she got from the Urgent Care.  They told the patient that she would need to get the medicine from her pulmonary doctor.  Patient states she has a kidney dr. Hilaria Ota. At 11am this morning so if the nurse needs to speak with her, please leave a detailed message.  CB# [phone]

## 2021-11-24 ENCOUNTER — Telehealth: Payer: Self-pay | Admitting: Family Medicine

## 2021-11-24 DIAGNOSIS — J449 Chronic obstructive pulmonary disease, unspecified: Secondary | ICD-10-CM | POA: Diagnosis not present

## 2021-11-24 DIAGNOSIS — I10 Essential (primary) hypertension: Secondary | ICD-10-CM | POA: Diagnosis not present

## 2021-11-24 NOTE — Telephone Encounter (Signed)
[name], [place] [facility], [phone]   Called stating pt has a bad cough and a lot of congestion. States pt is afraid to come into office. States she is wanting to know if she can please get medication to go into her nebulizer.

## 2021-11-24 NOTE — Telephone Encounter (Signed)
She has been in the ER several times and was told to follow up with pcp in 2 days after her 9/11 er discharge

## 2021-11-25 ENCOUNTER — Other Ambulatory Visit: Payer: Self-pay | Admitting: Family Medicine

## 2021-11-25 ENCOUNTER — Telehealth: Payer: Self-pay | Admitting: Internal Medicine

## 2021-11-25 MED ORDER — ALBUTEROL SULFATE (2.5 MG/3ML) 0.083% IN NEBU: 2.5000 mg | INHALATION_SOLUTION | Freq: Four times a day (QID) | RESPIRATORY_TRACT | 2 refills | Status: DC | PRN

## 2021-11-25 MED ORDER — ALBUTEROL SULFATE (2.5 MG/3ML) 0.083% IN NEBU: 2.5000 mg | INHALATION_SOLUTION | Freq: Four times a day (QID) | RESPIRATORY_TRACT | 12 refills | Status: DC | PRN

## 2021-11-25 NOTE — Telephone Encounter (Signed)
Verified with beth at [facility] and it is out for delivery to her home.

## 2021-11-25 NOTE — Progress Notes (Signed)
Albuterl neb

## 2021-11-25 NOTE — Telephone Encounter (Signed)
Spoke with pt who states preferred pharmacy is [name]. Albuterol neb solution order was placed as dictated by Dr. Valeta Harms. Pt stated understanding. Nothing further needed at this time.

## 2021-11-28 ENCOUNTER — Telehealth: Payer: Self-pay | Admitting: Internal Medicine

## 2021-11-28 DIAGNOSIS — K219 Gastro-esophageal reflux disease without esophagitis: Secondary | ICD-10-CM

## 2021-11-28 DIAGNOSIS — F39 Unspecified mood [affective] disorder: Secondary | ICD-10-CM

## 2021-11-28 DIAGNOSIS — D631 Anemia in chronic kidney disease: Secondary | ICD-10-CM

## 2021-11-28 DIAGNOSIS — M150 Primary generalized (osteo)arthritis: Secondary | ICD-10-CM

## 2021-11-28 DIAGNOSIS — E785 Hyperlipidemia, unspecified: Secondary | ICD-10-CM

## 2021-11-28 DIAGNOSIS — E039 Hypothyroidism, unspecified: Secondary | ICD-10-CM

## 2021-11-28 DIAGNOSIS — I130 Hypertensive heart and chronic kidney disease with heart failure and stage 1 through stage 4 chronic kidney disease, or unspecified chronic kidney disease: Secondary | ICD-10-CM

## 2021-11-28 DIAGNOSIS — K589 Irritable bowel syndrome without diarrhea: Secondary | ICD-10-CM

## 2021-11-28 DIAGNOSIS — J449 Chronic obstructive pulmonary disease, unspecified: Secondary | ICD-10-CM

## 2021-11-28 DIAGNOSIS — E1142 Type 2 diabetes mellitus with diabetic polyneuropathy: Secondary | ICD-10-CM

## 2021-11-28 DIAGNOSIS — R296 Repeated falls: Secondary | ICD-10-CM

## 2021-11-28 DIAGNOSIS — N1832 Chronic kidney disease, stage 3b: Secondary | ICD-10-CM

## 2021-11-28 DIAGNOSIS — Z794 Long term (current) use of insulin: Secondary | ICD-10-CM

## 2021-11-28 DIAGNOSIS — F250 Schizoaffective disorder, bipolar type: Secondary | ICD-10-CM

## 2021-11-28 DIAGNOSIS — I509 Heart failure, unspecified: Secondary | ICD-10-CM

## 2021-11-28 DIAGNOSIS — E1122 Type 2 diabetes mellitus with diabetic chronic kidney disease: Secondary | ICD-10-CM

## 2021-11-28 MED ORDER — AZITHROMYCIN 250 MG PO TABS: ORAL_TABLET | ORAL | 0 refills | Status: DC

## 2021-11-28 NOTE — Telephone Encounter (Signed)
Called and spoke to patient and verifiered pharmacy. Nothing further needed

## 2021-11-28 NOTE — Telephone Encounter (Signed)
Called patient and left voicemail for her to call office back in regards to her sore throat

## 2021-11-28 NOTE — Telephone Encounter (Signed)
[facility] is able to pull office notes. Nothing further needed

## 2021-11-28 NOTE — Telephone Encounter (Signed)
Zpak 250 mg, # 6, 2 today then one daily

## 2021-11-28 NOTE — Telephone Encounter (Signed)
Sore throat yesterday. Patient states that she has been coughing for about a week now and that she is having brown mucus.   No OTC medications used. No fever noted from patient

## 2021-11-30 ENCOUNTER — Other Ambulatory Visit: Payer: Self-pay | Admitting: Family Medicine

## 2021-11-30 DIAGNOSIS — Z1231 Encounter for screening mammogram for malignant neoplasm of breast: Secondary | ICD-10-CM

## 2021-11-30 NOTE — Progress Notes (Signed)
[name], [name] [id number]   CLINIC:  Medical Oncology/Hematology  PCP:  [name], MD [address] [name] [address] [phone]   REASON FOR VISIT:  Follow-up for microcytic anemia/iron deficiency anemia   PRIOR THERAPY: Iron tablets   CURRENT THERAPY: Intermittent IV iron + Procrit since 10/12/2021   INTERVAL HISTORY:  [name] 71 y.o. female returns for routine follow-up of her microcytic iron deficiency anemia.  She was last seen by Tarri Abernethy PA-C on 10/12/2021.   At today's visit, she reports feeling poorly due to chronic pain and severe fatigue.   Although she had had several hospitalizations prior to her last visit, she has not had any inpatient stays since that time, but has had several ED visits.  She was started on Procrit injections at her last visit.  Blood pressure today is 133/56, overall appears to be within her baseline range.  She has chronic breathing issues but denies any acute changes that would be concerning for DVT or PE.  She continues to have unchanged chronic fatigue.  She reports occasional dark stool, with last episode several months ago.  She denies any bright red blood per rectum or hematochezia. She has improved pica cravings for ice chips as well as occasional headaches.  She denies any restless legs, chest pain, or syncope. She has dyspnea on exertion and lightheadedness.  She denies any fever, chills, or unintentional weight loss.  She has 50% energy and 50% appetite. She endorses that she is maintaining a stable weight.   REVIEW OF SYSTEMS:    Review of Systems  Constitutional:  Positive for fatigue. Negative for appetite change, chills, diaphoresis, fever and unexpected weight change.  HENT:   Positive for trouble swallowing. Negative for lump/mass and nosebleeds.   Eyes:  Negative for eye problems.  Respiratory:  Positive for cough and shortness of breath (with  exertion). Negative for hemoptysis.   Cardiovascular:  Positive for palpitations. Negative for chest pain and leg swelling.  Gastrointestinal:  Positive for constipation. Negative for abdominal pain, blood in stool, diarrhea, nausea and vomiting.  Genitourinary:  Negative for hematuria.   Musculoskeletal:  Positive for arthralgias (Generalized body pain) and back pain.  Skin: Negative.   Neurological:  Positive for headaches, light-headedness and numbness. Negative for dizziness.  Hematological:  Does not bruise/bleed easily.  Psychiatric/Behavioral:  Positive for depression and sleep disturbance.       PAST MEDICAL/SURGICAL HISTORY:  Past Medical History:  Diagnosis Date   Allergy    Anemia    Anemia in chronic kidney disease (CKD) 08/10/2021   Anemia in chronic kidney disease (CKD) 10/12/2021   Anxiety    takes Ativan daily   Arthritis    Assistance needed for mobility    Bipolar disorder ([place])    takes Risperdal nightly   Blood transfusion    Brain tumor ([place])    Cancer ([place])    In her gum   Carpal tunnel syndrome of right wrist 05/23/2011   Cervical disc disorder with radiculopathy of cervical region 10/31/2012   Chronic back pain    Chronic idiopathic constipation    Chronic neck and back pain    Colon polyps    COPD (chronic obstructive pulmonary disease) with chronic bronchitis ([place]) 09/16/2013   Office Spirometry 10/30/2013-submaximal effort based on appearance of loop and curve. Numbers would fit with severe restriction but her physiologic capability may be better than this. FVC 0.91/44%,  and 10.74/45%, FEV1/FVC 0.81, FEF 25-75% 1.43/69%     Diabetes mellitus    Type II   Diverticulosis    TCS 9/08 by Dr. Delfin Edis for diarrhea . Bx for micro scopic colitis negative.    Fibromyalgia    Frequent falls    GERD (gastroesophageal reflux disease)    takes Aciphex daily   Glaucoma    eye drops daily   Gum symptoms    infection on antibiotic   Hiatal hernia     Hyperlipidemia    takes Crestor daily   Hypertension    takes Amlodipine,Metoprolol,and Clonidine daily   Hypothyroidism    takes Synthroid daily   IBS (irritable bowel syndrome)    Insomnia    takes Trazodone nightly   Major depression, recurrent ([place])    takes Zoloft daily   Malignant hyperpyrexia 81/15/7262   Metabolic encephalopathy 03/55/9741   Migraines    chronic headaches   Mononeuritis lower limb    Narcolepsy    Osteoporosis    Pancreatitis 2006   due to Depakote with normal EUS    Paralysis (HCC)    Schatzki's ring    non critical / EGD with ED 8/2011with RMR   Seizures ([place])    takes Lamictal daily.Last seizure 3 yrs ago   Sleep apnea    on CPAP   Small bowel obstruction (HCC)    Stroke ([place])    left sided weakness, speech changes   Tubular adenoma of colon    Past Surgical History:  Procedure Laterality Date   ABDOMINAL HYSTERECTOMY  1978   BACK SURGERY  July 2012   BACTERIAL OVERGROWTH TEST N/A 05/05/2013   Procedure: BACTERIAL OVERGROWTH TEST;  Surgeon: Daneil Dolin, MD;  [facility];  Service: Endoscopy;  Laterality: N/A;  7:30   BIOPSY THYROID  2009   BRAIN SURGERY  11/2011   resection of meningioma   BREAST REDUCTION SURGERY  1994   CARDIAC CATHETERIZATION  05/10/2005   normal coronaries, normal LV systolic function and EF (Dr. Jackie Plum)   [place] Left 07/22/04   Dr. Aline Brochure   CATARACT EXTRACTION Bilateral    CHOLECYSTECTOMY  1984   COLONOSCOPY N/A 09/25/2012   ULA:GTXMIWO diverticulosis.  colonic polyp-removed : tubular adenoma   CRANIOTOMY  11/23/2011   Procedure: CRANIOTOMY TUMOR EXCISION;  Surgeon: Hosie Spangle, MD;  Location: [facility];  Service: Neurosurgery;  Laterality: N/A;  Craniotomy for tumor resection   ESOPHAGOGASTRODUODENOSCOPY  12/29/2010   [place]-Retained food in the esophagus and stomach, small hiatal hernia, status post Maloney dilation of the esophagus   ESOPHAGOGASTRODUODENOSCOPY N/A 09/25/2012    EHO:[id number] atonic baggy esophagus status post Maloney dilation 85 F. Hiatal hernia   GIVENS CAPSULE STUDY N/A 01/15/2013   NORMAL.    IR GENERIC HISTORICAL  03/17/2016   IR RADIOLOGIST EVAL & MGMT 03/17/2016 [facility]   LESION REMOVAL N/A 05/31/2015   Procedure: REMOVAL RIGHT AND LEFT LESIONS OF MANDIBLE;  Surgeon: Diona Browner, DDS;  Location: [facility];  Service: Oral Surgery;  Laterality: N/A;   MALONEY DILATION  12/29/2010   RMR;   NM MYOCAR PERF WALL MOTION  2006   "relavtiely normal" persantine, mild anterior thinning (breast attenuation artifact), no region of scar/ischemia   OVARIAN CYST REMOVAL     RECTOCELE REPAIR N/A 06/29/2015   Procedure: POSTERIOR REPAIR (RECTOCELE);  Surgeon: Jonnie Kind, MD;  Location: [facility];  Service: Gynecology;  Laterality: N/A;   REDUCTION MAMMAPLASTY Bilateral  SPINE SURGERY  09/29/2010   Dr. Rolena Infante   surgical excision of 3 tumors from right thigh and right buttock  and left upper thigh  2010   TOOTH EXTRACTION Bilateral 12/14/2014   Procedure: REMOVAL OF BILATERAL MANDIBULAR EXOSTOSES;  Surgeon: Diona Browner, DDS;  Location: [facility];  Service: Oral Surgery;  Laterality: Bilateral;   TRANSTHORACIC ECHOCARDIOGRAM  2010   EF 60-65%, mild conc LVH, grade 1 diastolic dysfunction; mildly calcified MV annulus with mildly thickened leaflets, mildly calcified MR annulus     SOCIAL HISTORY:  Social History   Socioeconomic History   Marital status: Divorced    Spouse name: Not on file   Number of children: 1   Years of education: 12   Highest education level: High school graduate  Occupational History   Occupation: Disabled  Tobacco Use   Smoking status: Former    Packs/day: 0.25    Years: 30.00    Total pack years: 7.50    Types: Cigarettes    Quit date: 07/05/2021    Years since quitting: 0.4    Passive exposure: Past   Smokeless tobacco: Never   Tobacco comments:    3-4 a day MRC 05/17/2021  Vaping Use   Vaping Use: Never used  Substance  and Sexual Activity   Alcohol use: No    Alcohol/week: 0.0 standard drinks of alcohol    Comment:     Drug use: No   Sexual activity: Not Currently  Other Topics Concern   Not on file  Social History Narrative   01/29/18 Lives alone, has 3 aides, Mon- Fri 8 hrs, 2 hrs on Sat-Sun, RN manages her meds   Caffeine use: Drink coffee sometimes    Right handed    Social Determinants of Health   Financial Resource Strain: Low Risk  (07/11/2021)   Overall Financial Resource Strain (CARDIA)    Difficulty of Paying Living Expenses: Not very hard  Food Insecurity: No Food Insecurity (08/23/2021)   Hunger Vital Sign    Worried About Running Out of Food in the Last Year: Never true    Ran Out of Food in the Last Year: Never true  Transportation Needs: No Transportation Needs (08/23/2021)   PRAPARE - Hydrologist (Medical): No    Lack of Transportation (Non-Medical): No  Physical Activity: Inactive (07/11/2021)   Exercise Vital Sign    Days of Exercise per Week: 0 days    Minutes of Exercise per Session: 0 min  Stress: No Stress Concern Present (07/11/2021)   [place]    Feeling of Stress : Only a little  Social Connections: Moderately Isolated (07/11/2021)   Social Connection and Isolation Panel [NHANES]    Frequency of Communication with Friends and Family: Three times a week    Frequency of Social Gatherings with Friends and Family: Twice a week    Attends Religious Services: More than 4 times per year    Active Member of [employer] or Organizations: No    Attends Archivist Meetings: Never    Marital Status: Divorced  Human resources officer Violence: Not At Risk (05/21/2019)   Humiliation, Afraid, Rape, and Kick questionnaire    Fear of Current or Ex-Partner: No    Emotionally Abused: No    Physically Abused: No    Sexually Abused: No    FAMILY HISTORY:  Family History  Problem Relation Age of  Onset   Heart attack Mother  HTN   Pneumonia Father    Kidney failure Father    Diabetes Father    Pancreatic cancer Sister    Cancer Sister        breast    Cancer Sister        pancreatic   Diabetes Brother    Hypertension Brother    Diabetes Brother    Alcohol abuse Maternal Uncle    Stroke Maternal Grandmother    Heart attack Maternal Grandfather    Hypertension Son    Sleep apnea Son    Colon cancer Neg Hx    Anesthesia problems Neg Hx    Hypotension Neg Hx    Malignant hyperthermia Neg Hx    Pseudochol deficiency Neg Hx    Breast cancer Neg Hx    Stomach cancer Neg Hx     CURRENT MEDICATIONS:  Outpatient Encounter Medications as of 12/01/2021  Medication Sig Note   ACCU-CHEK GUIDE test strip TEST BLOOD SUGAR FOUR TIMES DAILY  AND AS NEEDED    Accu-Chek Softclix Lancets lancets TEST BLOOD SUGAR THREE TIMES DAILY AS DIRECTED    acetaminophen (TYLENOL) 325 MG tablet Take 650 mg by mouth every 6 (six) hours as needed.    albuterol (PROVENTIL) (2.5 MG/3ML) 0.083% nebulizer solution Take 3 mLs (2.5 mg total) by nebulization every 6 (six) hours as needed for wheezing or shortness of breath.    albuterol (PROVENTIL) (2.5 MG/3ML) 0.083% nebulizer solution Take 3 mLs (2.5 mg total) by nebulization every 6 (six) hours as needed for wheezing or shortness of breath.    albuterol (VENTOLIN HFA) 108 (90 Base) MCG/ACT inhaler INHALE 1 TO 2 PUFFS EVERY 6 HOURS AS NEEDED FOR WHEEZING, SHORTNESS OF BREATH (Patient taking differently: Inhale 1-2 puffs into the lungs every 6 (six) hours as needed for wheezing or shortness of breath.)    Alcohol Swabs (DROPSAFE ALCOHOL PREP) 70 % PADS USE TO CLEAN FINGER PRIOR TO TESTING FOR BLOOD SUGAR  AS DIRECTED    alendronate (FOSAMAX) 70 MG tablet TAKE 1 TABLET EVERY 7 DAYS ON AN EMPTY STOMACH WITH A FULL GLASS OF WATER (Patient taking differently: Take 70 mg by mouth once a week.)    amLODipine (NORVASC) 10 MG tablet TAKE 1 TABLET EVERY DAY  (Patient taking differently: Take 10 mg by mouth daily.)    ascorbic acid (VITAMIN C) 500 MG tablet Take 500 mg by mouth daily.    aspirin EC 81 MG tablet Take 1 tablet (81 mg total) by mouth daily with breakfast.    azithromycin (ZITHROMAX) 250 MG tablet Take as directed    benzonatate (TESSALON) 100 MG capsule Take 1 capsule (100 mg total) by mouth every 8 (eight) hours.    Blood Glucose Calibration (ACCU-CHEK GUIDE CONTROL) LIQD USE AS DIRECTED    blood glucose meter kit and supplies Dispense based on patient and insurance preference. Use up to four times daily as directed. (FOR ICD-10 E10.9, E11.9).    buPROPion (WELLBUTRIN XL) 150 MG 24 hr tablet Take 1 tablet (150 mg total) by mouth every morning.    Calcium Carb-Cholecalciferol (CALTRATE 600+D3 SOFT) 600-20 MG-MCG CHEW Chew 1 tablet by mouth daily at 12 noon.    cetirizine (ZYRTEC) 10 MG tablet Take 1 tablet (10 mg total) by mouth daily.    Cholecalciferol (D3 PO) Take 1 tablet by mouth daily. 09/16/2021: Pt does not know how many units   Clobetasol Prop Emollient Base (CLOBETASOL PROPIONATE E) 0.05 % emollient cream Apply to affected area qd (  Patient taking differently: Apply 1 Application topically daily.)    Continuous Blood Gluc Sensor (FREESTYLE LIBRE 14 DAY SENSOR) MISC 1 each by Does not apply route every 14 (fourteen) days. Change every 2 weeks    diclofenac Sodium (VOLTAREN) 1 % GEL APPLY 2 GRAMS TOPICALLY FOUR TIMES DAILY. (Patient taking differently: Apply 2 g topically 4 (four) times daily.)    Docusate Sodium (DSS) 100 MG CAPS Take 1 tablet by mouth in the morning and at bedtime.    doxycycline (VIBRAMYCIN) 100 MG capsule Take 1 capsule (100 mg total) by mouth 2 (two) times daily.    DROPLET PEN NEEDLES 31G X 8 MM MISC USE FOR INJECTING INSULIN 4 TIMES DAILY.    ezetimibe (ZETIA) 10 MG tablet TAKE 1 TABLET EVERY DAY (Patient taking differently: Take 10 mg by mouth daily.)    insulin aspart (NOVOLOG FLEXPEN) 100 UNIT/ML FlexPen  Inject 5 Units into the skin 3 (three) times daily with meals. Give if eats 50% or more of meal. (Patient taking differently: Inject 14 Units into the skin 3 (three) times daily with meals. Give if eats 50% or more of meal.)    insulin glargine, 2 Unit Dial, (TOUJEO MAX SOLOSTAR) 300 UNIT/ML Solostar Pen Inject 20-25 Units into the skin daily.    JARDIANCE 10 MG TABS tablet Take 10 mg by mouth daily. (Patient not taking: Reported on 11/09/2021)    lactobacillus acidophilus (BACID) TABS tablet Take 2 tablets by mouth 3 (three) times daily.    lamoTRIgine (LAMICTAL) 100 MG tablet Take 1 tablet (100 mg total) by mouth 2 (two) times daily.    levothyroxine (SYNTHROID) 50 MCG tablet TAKE 1 TABLET EVERY DAY FOR 6 DAYS PER WEEK, 1/2 TABLET 1 DAY PER WEEK    LORazepam (ATIVAN) 0.5 MG tablet Take 1 tablet (0.5 mg total) by mouth 2 (two) times daily.    losartan (COZAAR) 50 MG tablet TAKE 1 TABLET EVERY DAY    meclizine (ANTIVERT) 25 MG tablet Take 1 tablet (25 mg total) by mouth 3 (three) times daily as needed for dizziness.    metoprolol tartrate (LOPRESSOR) 50 MG tablet TAKE 1 TABLET TWICE DAILY (NEED MD APPOINTMENT) (Patient taking differently: Take 50 mg by mouth 2 (two) times daily.)    Misc. Devices (MATTRESS PAD) MISC FIRM MATTRESS PAD for hospital bed x 1    montelukast (SINGULAIR) 10 MG tablet TAKE 1 TABLET EVERY DAY (Patient taking differently: Take 10 mg by mouth at bedtime.)    Multiple Vitamins-Minerals (MULTIVITAMIN GUMMIES ADULT PO) Take 1 tablet by mouth daily.    MYRBETRIQ 25 MG TB24 tablet TAKE 1 TABLET EVERY DAY    nystatin (MYCOSTATIN/NYSTOP) powder APPLY TO AFFECTED AREA 4 TIMES DAILY. (Patient taking differently: Apply 1 Application topically in the morning, at noon, in the evening, and at bedtime.)    Omega-3 Fatty Acids (FISH OIL PO) Take 1 capsule by mouth daily.    ondansetron (ZOFRAN) 4 MG tablet Take 1 tablet (4 mg total) by mouth every 8 (eight) hours as needed for nausea or  vomiting.    oxyCODONE-acetaminophen (PERCOCET) 10-325 MG tablet Take 1 tablet by mouth every 8 (eight) hours as needed for pain.    predniSONE (DELTASONE) 20 MG tablet Take 1 tablet (20 mg total) by mouth daily.    pregabalin (LYRICA) 75 MG capsule Take 1 capsule (75 mg total) by mouth daily.    RABEprazole (ACIPHEX) 20 MG tablet TAKE 1 TABLET TWICE DAILY    RESTASIS 0.05 %  ophthalmic emulsion Place 2 drops into both eyes daily.    risperiDONE (RISPERDAL) 0.5 MG tablet Take 1 tablet (0.5 mg total) by mouth at bedtime.    rosuvastatin (CRESTOR) 5 MG tablet TAKE 1 TABLET AT BEDTIME.    sertraline (ZOLOFT) 100 MG tablet Take 1 tablet (100 mg total) by mouth every morning.    STIOLTO RESPIMAT 2.5-2.5 MCG/ACT AERS INHALE 2 PUFFS INTO THE LUNGS DAILY. (Patient not taking: Reported on 11/09/2021)    traZODone (DESYREL) 150 MG tablet Take 1 tablet (150 mg total) by mouth at bedtime.    vitamin E 180 MG (400 UNITS) capsule Take 400 Units by mouth daily.    No facility-administered encounter medications on file as of 12/01/2021.    ALLERGIES:  Allergies  Allergen Reactions   Iron Nausea And Vomiting    And itching And itching   Milk ([place]) Rash    Doesn't agree with stomach.  Doesn't agree with stomach.  Doesn't agree with stomach.    Penicillins Hives    Has patient had a PCN reaction causing immediate rash, facial/tongue/throat swelling, SOB or lightheadedness with hypotension: Yes Has patient had a PCN reaction causing severe rash involving mucus membranes or skin necrosis: No Has patient had a PCN reaction that required hospitalization No Has patient had a PCN reaction occurring within the last 10 years: No If all of the above answers are "NO", then may proceed with Cephalosporin use.  Other reaction(s): Other (see comments) Has patient had a PCN reaction causing immediate rash, facial/tongue/throat swelling, SOB or lightheadedness with hypotension: Yes Has patient had a PCN reaction  causing severe rash involving mucus membranes or skin necrosis: No Has patient had a PCN reaction that required hospitalization No Has patient had a PCN reaction occurring within the last 10 years: No If all of the above answers are "NO", then may proceed with Cephalosporin use. Has patient had a PCN reaction causing immediate rash, facial/tongue/throat swelling, SOB or lightheadedness with hypotension: Yes Has patient had a PCN reaction causing severe rash involving mucus membranes or skin necrosis: No Has patient had a PCN reaction that required hospitalization No Has patient had a PCN reaction occurring within the last 10 years: No If all of the above answers are "NO", then may proceed with Cephalosporin use. Has patient had a PCN reaction causing immediate rash, facial/tongue/throat swelling, SOB or lightheadedness with hypotension: Yes Has patient had a PCN reaction causing severe rash involving mucus membranes or skin necrosis: No Has patient had a PCN reaction that required hospitalization No Has patient had a PCN reaction occurring within the last 10 years: No If all of the above answers are "NO", then may proceed with Cephalosporin use. Has patient had a PCN reaction causing immediate rash, facial/tongue/throat swelling, SOB or lightheadedness with hypotension: Yes Has patient had a PCN reaction causing sever... (TRUNCATED)   Phenazopyridine Hives   Phenazopyridine Hcl Hives   Cephalexin Hives   Flonase [Fluticasone]     "It gave me ulcers in my nose"   Milk-Related Compounds Other (See Comments)    Doesn't agree with stomach.    Phenazopyridine Hcl Hives           PHYSICAL EXAM:    ECOG PERFORMANCE STATUS: 2 - Symptomatic, <50% confined to bed  There were no vitals filed for this visit. There were no vitals filed for this visit. Physical Exam Constitutional:      Appearance: Normal appearance. She is obese.     Comments: Appears  somewhat somnolent during appointment, but  rouses easily and participates appropriately in visit.  HENT:     Head: Normocephalic and atraumatic.     Mouth/Throat:     Mouth: Mucous membranes are moist.  Eyes:     Extraocular Movements: Extraocular movements intact.     Pupils: Pupils are equal, round, and reactive to light.  Cardiovascular:     Rate and Rhythm: Normal rate and regular rhythm.     Pulses: Normal pulses.     Heart sounds: Normal heart sounds.  Pulmonary:     Effort: Pulmonary effort is normal.     Breath sounds: Normal breath sounds.     Comments: Decreased breath sounds, auscultation limited by body habitus Abdominal:     General: Bowel sounds are normal.     Palpations: Abdomen is soft.     Tenderness: There is no abdominal tenderness.  Musculoskeletal:        General: No swelling.     Right lower leg: No edema.     Left lower leg: No edema.  Lymphadenopathy:     Cervical: No cervical adenopathy.  Skin:    General: Skin is warm and dry.  Neurological:     General: No focal deficit present.     Mental Status: She is alert and oriented to person, place, and time.     Motor: Weakness (Chronic right hand hemiparesis) present.  Psychiatric:        Mood and Affect: Mood normal.        Behavior: Behavior normal.      LABORATORY DATA:  I have reviewed the labs as listed.  CBC    Component Value Date/Time   WBC 10.5 11/20/2021 1118   WBC 7.8 11/16/2021 1211   RBC 4.56 11/20/2021 1118   RBC 4.54 11/16/2021 1211   HGB 10.3 (L) 11/20/2021 1118   HCT 35.4 11/20/2021 1118   PLT 305 11/20/2021 1118   MCV 78 (L) 11/20/2021 1118   MCH 22.6 (L) 11/20/2021 1118   MCH 22.7 (L) 11/16/2021 1211   MCHC 29.1 (L) 11/20/2021 1118   MCHC 28.9 (L) 11/16/2021 1211   RDW 13.9 11/20/2021 1118   LYMPHSABS 2.1 11/20/2021 1118   MONOABS 0.7 10/12/2021 0932   EOSABS 0.4 11/20/2021 1118   BASOSABS 0.0 11/20/2021 1118      Latest Ref Rng & Units 11/20/2021   11:18 AM 11/09/2021   11:36 AM 10/17/2021    9:37 AM  CMP   Glucose 70 - 99 mg/dL 36  155  38   BUN 8 - 27 mg/dL _0 Creatinine 0.57 - 1.00 mg/dL 0.93  0.96  1.16   Sodium 134 - 144 mmol/L 146  141  144   Potassium 3.5 - 5.2 mmol/L 3.5  3.6  4.0   Chloride 96 - 106 mmol/L 109  107  106   CO2 20 - 29 mmol/L _1 Calcium 8.7 - 10.3 mg/dL 9.0  9.2  9.5   Total Protein 6.0 - 8.5 g/dL 6.6  7.4    Total Bilirubin 0.0 - 1.2 mg/dL <0.2  0.6    Alkaline Phos 44 - 121 IU/L 80  67    AST 0 - 40 IU/L 19  22    ALT 0 - 32 IU/L 20  20      DIAGNOSTIC IMAGING:  I have independently reviewed the relevant imaging and discussed with the patient.  ASSESSMENT &  PLAN: 1.  Microcytic anemia - This is a combination anemia from functional iron deficiency (in the setting of chronic disease including COPD, type 2 diabetes, fibromyalgia, hypertension, and multiple other comorbidities listed in history) and chronic kidney disease stage III. - She has chronic microcytosis - SPEP checked in 2019 was normal. - Hemoccult stool positive in 2014, negative in 2015 - Colonoscopy (11/04/2020): Multiple diverticula, otherwise normal - EGD (11/04/2020): Normal esophagus, gastritis, gastric lipoma - She was previously placed on iron pills, but could not tolerate them due to constipation. - Most recent IV iron Venofer 300 mg x 2 from 08/16/2021 through 08/29/2021 (requires PREMEDICATION with steroids and Zofran due to episode of mild swelling, itching, and nausea after Feraheme in October 2020) - She has been on Procrit 10,000 units every 2 weeks since 11/02/2021 - She denies any bleeding per rectum or melena, but does have occasional dark stools  - Symptomatic with significant fatigue which is chronic and unchanged - Most recent labs (10/12/2021): Hgb 8.9/MCV 80.9, ferritin 397, iron saturation 20%.  Creatinine 1.11/GFR 53, which is improved from kidney function during recent hospitalizations.  Normal folate, copper, vitamin B12 when checked in April 2023. - CBC today  (12/01/2021): Hgb 10.0/MCV 79.8 - Hemoccult stool x3 ordered in August 2023, but not returned by patient.   - PLAN: No indication for IV iron at this time. - Continue 10,000 units every 2 weeks.   - Repeat CBC, CMP, ferritin, and iron/TIBC with same-day office visit in 2 months - She follows with Dr. Theador Hawthorne for her CKD stage IIIa   PLAN SUMMARY & DISPOSITION:   CBC + Procrit every 2 weeks Same-day labs and office visit in 2 months  All questions were answered. The patient knows to call the clinic with any problems, questions or concerns.  Medical decision making: Moderate  Time spent on visit: I spent 20 minutes counseling the patient face to face. The total time spent in the appointment was 30 minutes and more than 50% was on counseling.   Harriett Rush, PA-C  12/02/2021 7:22 PM

## 2021-12-01 ENCOUNTER — Inpatient Hospital Stay: Payer: Medicare HMO

## 2021-12-01 ENCOUNTER — Inpatient Hospital Stay (HOSPITAL_BASED_OUTPATIENT_CLINIC_OR_DEPARTMENT_OTHER): Payer: Medicare HMO | Admitting: Physician Assistant

## 2021-12-01 VITALS — BP 133/56 | HR 64 | Temp 98.2°F | Resp 18 | Wt 166.7 lb

## 2021-12-01 DIAGNOSIS — D509 Iron deficiency anemia, unspecified: Secondary | ICD-10-CM

## 2021-12-01 DIAGNOSIS — N1832 Chronic kidney disease, stage 3b: Secondary | ICD-10-CM | POA: Diagnosis not present

## 2021-12-01 DIAGNOSIS — Z79899 Other long term (current) drug therapy: Secondary | ICD-10-CM | POA: Diagnosis not present

## 2021-12-01 DIAGNOSIS — I129 Hypertensive chronic kidney disease with stage 1 through stage 4 chronic kidney disease, or unspecified chronic kidney disease: Secondary | ICD-10-CM | POA: Diagnosis not present

## 2021-12-01 DIAGNOSIS — D631 Anemia in chronic kidney disease: Secondary | ICD-10-CM

## 2021-12-01 DIAGNOSIS — D638 Anemia in other chronic diseases classified elsewhere: Secondary | ICD-10-CM

## 2021-12-01 DIAGNOSIS — N182 Chronic kidney disease, stage 2 (mild): Secondary | ICD-10-CM | POA: Diagnosis not present

## 2021-12-01 LAB — CBC
HCT: 35.6 % — ABNORMAL LOW (ref 36.0–46.0)
Hemoglobin: 10 g/dL — ABNORMAL LOW (ref 12.0–15.0)
MCH: 22.4 pg — ABNORMAL LOW (ref 26.0–34.0)
MCHC: 28.1 g/dL — ABNORMAL LOW (ref 30.0–36.0)
MCV: 79.8 fL — ABNORMAL LOW (ref 80.0–100.0)
Platelets: 234 10*3/uL (ref 150–400)
RBC: 4.46 MIL/uL (ref 3.87–5.11)
RDW: 14.6 % (ref 11.5–15.5)
WBC: 6.5 10*3/uL (ref 4.0–10.5)
nRBC: 0 % (ref 0.0–0.2)

## 2021-12-01 MED ORDER — EPOETIN ALFA 10000 UNIT/ML IJ SOLN
10000.0000 [IU] | Freq: Once | INTRAMUSCULAR | Status: AC
  Administered 2021-12-01: 10000 [IU] via SUBCUTANEOUS
  Filled 2021-12-01: qty 1

## 2021-12-01 NOTE — Patient Instructions (Signed)
[facility]  Discharge Instructions: Thank you for choosing [facility] to provide your oncology and hematology care.  If you have a lab appointment with the Cancer Center, please come in thru the Main Entrance and check in at the main information desk.  Wear comfortable clothing and clothing appropriate for easy access to any Portacath or PICC line.   We strive to give you quality time with your provider. You may need to reschedule your appointment if you arrive late (15 or more minutes).  Arriving late affects you and other patients whose appointments are after yours.  Also, if you miss three or more appointments without notifying the office, you may be dismissed from the clinic at the provider's discretion.      For prescription refill requests, have your pharmacy contact our office and allow 72 hours for refills to be completed.    Today you received the following chemotherapy and/or immunotherapy agents Retacrit 10,000 units   To help prevent nausea and vomiting after your treatment, we encourage you to take your nausea medication as directed.  BELOW ARE SYMPTOMS THAT SHOULD BE REPORTED IMMEDIATELY: *FEVER GREATER THAN 100.4 F (38 C) OR HIGHER *CHILLS OR SWEATING *NAUSEA AND VOMITING THAT IS NOT CONTROLLED WITH YOUR NAUSEA MEDICATION *UNUSUAL SHORTNESS OF BREATH *UNUSUAL BRUISING OR BLEEDING *URINARY PROBLEMS (pain or burning when urinating, or frequent urination) *BOWEL PROBLEMS (unusual diarrhea, constipation, pain near the anus) TENDERNESS IN MOUTH AND THROAT WITH OR WITHOUT PRESENCE OF ULCERS (sore throat, sores in mouth, or a toothache) UNUSUAL RASH, SWELLING OR PAIN  UNUSUAL VAGINAL DISCHARGE OR ITCHING   Items with * indicate a potential emergency and should be followed up as soon as possible or go to the Emergency Department if any problems should occur.  Please show the CHEMOTHERAPY ALERT CARD or IMMUNOTHERAPY ALERT CARD at check-in to  the Emergency Department and triage nurse.  Should you have questions after your visit or need to cancel or reschedule your appointment, please contact [facility] [phone]  and follow the prompts.  Office hours are 8:00 a.m. to 4:30 p.m. Monday - Friday. Please note that voicemails left after 4:00 p.m. may not be returned until the following business day.  We are closed weekends and major holidays. You have access to a nurse at all times for urgent questions. Please call the main number to the clinic [phone] and follow the prompts.  For any non-urgent questions, you may also contact your provider using MyChart. We now offer e-Visits for anyone 18 and older to request care online for non-urgent symptoms. For details visit [email or web address].   Also download the MyChart app! Go to the app store, search "MyChart", open the app, select [facility], and log in with your MyChart username and password.  Masks are optional in the cancer centers. If you would like for your care team to wear a mask while they are taking care of you, please let them know. You may have one support person who is at least 71 years old accompany you for your appointments.  

## 2021-12-01 NOTE — Progress Notes (Signed)
[name] presents today for injection per the provider's orders.  Retacrit 10,000 units administration without incident; injection site WNL; see MAR for injection details.  Patient tolerated procedure well and without incident.  No questions or complaints noted at this time.   Discharged from clinic ambulatory with walker in stable condition. Alert and oriented x 3. F/U with [facility] as scheduled.

## 2021-12-01 NOTE — Patient Instructions (Signed)
[facility] [facility] Discharge Instructions  You were seen today by Tarri Abernethy PA-C for your anemia.  This is most likely related to your underlying chronic kidney disease.  We will continue your Retacrit shots every other week.  You will need to receive these shots once every 2 weeks to help improve your blood count.   LABS: We will check blood counts every 2 weeks on the same day as your Retacrit injection.  MEDICATIONS: Retacrit injection every 2 weeks  FOLLOW-UP APPOINTMENT: Office visit in 6 weeks   Thank you for choosing [place] at [facility] to provide your oncology and hematology care.  To afford each patient quality time with our provider, please arrive at least 15 minutes before your scheduled appointment time.   If you have a lab appointment with the [facility] please come in thru the Main Entrance and check in at the main information desk.  You need to re-schedule your appointment should you arrive 10 or more minutes late.  We strive to give you quality time with our providers, and arriving late affects you and other patients whose appointments are after yours.  Also, if you no show three or more times for appointments you may be dismissed from the clinic at the providers discretion.     Again, thank you for choosing [facility].  Our hope is that these requests will decrease the amount of time that you wait before being seen by our physicians.       _____________________________________________________________  Should you have questions after your visit to [facility], please contact our office at [phone] and follow the prompts.  Our office hours are 8:00 a.m. and 4:30 p.m. Monday - Friday.  Please note that voicemails left after 4:00 p.m. may not be returned until the following business day.  We are closed weekends and major holidays.  You do have access to a nurse 24-7, just call the  main number to the clinic [phone] and do not press any options, hold on the line and a nurse will answer the phone.    For prescription refill requests, have your pharmacy contact our office and allow 72 hours.

## 2021-12-02 ENCOUNTER — Ambulatory Visit: Payer: Medicare HMO | Admitting: Family Medicine

## 2021-12-02 ENCOUNTER — Encounter: Payer: Self-pay | Admitting: Hematology

## 2021-12-03 DIAGNOSIS — E1165 Type 2 diabetes mellitus with hyperglycemia: Secondary | ICD-10-CM | POA: Diagnosis not present

## 2021-12-05 ENCOUNTER — Ambulatory Visit (INDEPENDENT_AMBULATORY_CARE_PROVIDER_SITE_OTHER): Payer: Medicare HMO | Admitting: Psychiatry

## 2021-12-05 DIAGNOSIS — F331 Major depressive disorder, recurrent, moderate: Secondary | ICD-10-CM
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
## 2021-12-06 ENCOUNTER — Ambulatory Visit (INDEPENDENT_AMBULATORY_CARE_PROVIDER_SITE_OTHER): Payer: Medicare HMO | Admitting: Podiatry

## 2021-12-06 ENCOUNTER — Encounter: Payer: Self-pay | Admitting: Podiatry

## 2021-12-06 DIAGNOSIS — E1142 Type 2 diabetes mellitus with diabetic polyneuropathy: Secondary | ICD-10-CM | POA: Diagnosis not present

## 2021-12-06 DIAGNOSIS — B351 Tinea unguium: Secondary | ICD-10-CM

## 2021-12-06 DIAGNOSIS — R6889 Other general symptoms and signs: Secondary | ICD-10-CM | POA: Diagnosis not present

## 2021-12-06 DIAGNOSIS — M79676 Pain in unspecified toe(s): Secondary | ICD-10-CM | POA: Diagnosis not present

## 2021-12-06 DIAGNOSIS — Q828 Other specified congenital malformations of skin: Secondary | ICD-10-CM | POA: Diagnosis not present

## 2021-12-07 ENCOUNTER — Encounter (HOSPITAL_COMMUNITY): Payer: Self-pay | Admitting: Psychiatry

## 2021-12-07 ENCOUNTER — Telehealth (INDEPENDENT_AMBULATORY_CARE_PROVIDER_SITE_OTHER): Payer: Medicare HMO | Admitting: Psychiatry

## 2021-12-07 DIAGNOSIS — F331 Major depressive disorder, recurrent, moderate: Secondary | ICD-10-CM | POA: Diagnosis not present

## 2021-12-07 MED ORDER — BUPROPION HCL ER (XL) 150 MG PO TB24: 150.0000 mg | ORAL_TABLET | ORAL | 2 refills | Status: DC

## 2021-12-07 MED ORDER — RISPERIDONE 0.5 MG PO TABS: 0.5000 mg | ORAL_TABLET | Freq: Every day | ORAL | 2 refills | Status: DC

## 2021-12-07 MED ORDER — LAMOTRIGINE 100 MG PO TABS: 100.0000 mg | ORAL_TABLET | Freq: Two times a day (BID) | ORAL | 2 refills | Status: DC

## 2021-12-07 MED ORDER — LORAZEPAM 0.5 MG PO TABS: 0.5000 mg | ORAL_TABLET | Freq: Two times a day (BID) | ORAL | 2 refills | Status: DC

## 2021-12-07 MED ORDER — SERTRALINE HCL 100 MG PO TABS: 100.0000 mg | ORAL_TABLET | ORAL | 2 refills | Status: DC

## 2021-12-07 MED ORDER — TRAZODONE HCL 150 MG PO TABS: 150.0000 mg | ORAL_TABLET | Freq: Every day | ORAL | 2 refills | Status: DC
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
## 2021-12-09 ENCOUNTER — Encounter: Payer: Self-pay | Admitting: Family Medicine

## 2021-12-09 ENCOUNTER — Ambulatory Visit (INDEPENDENT_AMBULATORY_CARE_PROVIDER_SITE_OTHER): Payer: Medicare HMO | Admitting: Family Medicine

## 2021-12-09 VITALS — BP 142/66 | HR 69 | Ht 59.0 in | Wt 164.0 lb

## 2021-12-09 DIAGNOSIS — I10 Essential (primary) hypertension: Secondary | ICD-10-CM

## 2021-12-09 DIAGNOSIS — E782 Mixed hyperlipidemia: Secondary | ICD-10-CM

## 2021-12-09 DIAGNOSIS — F322 Major depressive disorder, single episode, severe without psychotic features: Secondary | ICD-10-CM

## 2021-12-09 NOTE — Patient Instructions (Signed)
Annual exam in December as before, call if you need me sooner  Please continue to work on quitting  smoking  Please speak with therapist as you deal with stresses of life  Careful not to fall  Thanks for choosing [facility], we consider it a privelige to serve you.

## 2021-12-10 DIAGNOSIS — I110 Hypertensive heart disease with heart failure: Secondary | ICD-10-CM | POA: Diagnosis not present

## 2021-12-10 DIAGNOSIS — I509 Heart failure, unspecified: Secondary | ICD-10-CM | POA: Diagnosis not present

## 2021-12-10 DIAGNOSIS — E1159 Type 2 diabetes mellitus with other circulatory complications: Secondary | ICD-10-CM

## 2021-12-10 DIAGNOSIS — J449 Chronic obstructive pulmonary disease, unspecified: Secondary | ICD-10-CM

## 2021-12-10 NOTE — Progress Notes (Signed)
Subjective:  Patient ID: [name], female    DOB: 05-07-1950,  MRN: [id number]  Rebbeca Paul [name] presents to clinic today for : Chief Complaint  Patient presents with   Nail Problem    Diabetic foot care BS-Did not check today Verlene Mayer PCP VST-Early 2023?    New problem(s): None.   PCP is [name], MD , and last visit was  November 08, 2021.  Allergies  Allergen Reactions   Iron Nausea And Vomiting    And itching And itching   Milk (Cow) Rash    Doesn't agree with stomach.  Doesn't agree with stomach.  Doesn't agree with stomach.    Penicillins Hives    Has patient had a PCN reaction causing immediate rash, facial/tongue/throat swelling, SOB or lightheadedness with hypotension: Yes Has patient had a PCN reaction causing severe rash involving mucus membranes or skin necrosis: No Has patient had a PCN reaction that required hospitalization No Has patient had a PCN reaction occurring within the last 10 years: No If all of the above answers are "NO", then may proceed with Cephalosporin use.  Other reaction(s): Other (see comments) Has patient had a PCN reaction causing immediate rash, facial/tongue/throat swelling, SOB or lightheadedness with hypotension: Yes Has patient had a PCN reaction causing severe rash involving mucus membranes or skin necrosis: No Has patient had a PCN reaction that required hospitalization No Has patient had a PCN reaction occurring within the last 10 years: No If all of the above answers are "NO", then may proceed with Cephalosporin use. Has patient had a PCN reaction causing immediate rash, facial/tongue/throat swelling, SOB or lightheadedness with hypotension: Yes Has patient had a PCN reaction causing severe rash involving mucus membranes or skin necrosis: No Has patient had a PCN reaction that required hospitalization No Has patient had a PCN reaction occurring within the last 10 years: No If all of the above answers are  "NO", then may proceed with Cephalosporin use. Has patient had a PCN reaction causing immediate rash, facial/tongue/throat swelling, SOB or lightheadedness with hypotension: Yes Has patient had a PCN reaction causing severe rash involving mucus membranes or skin necrosis: No Has patient had a PCN reaction that required hospitalization No Has patient had a PCN reaction occurring within the last 10 years: No If all of the above answers are "NO", then may proceed with Cephalosporin use. Has patient had a PCN reaction causing immediate rash, facial/tongue/throat swelling, SOB or lightheadedness with hypotension: Yes Has patient had a PCN reaction causing sever... (TRUNCATED)   Phenazopyridine Hives   Phenazopyridine Hcl Hives   Cephalexin Hives   Flonase [Fluticasone]     "It gave me ulcers in my nose"   Milk-Related Compounds Other (See Comments)    Doesn't agree with stomach.    Phenazopyridine Hcl Hives         Review of Systems: Negative except as noted in the HPI.  Objective: No changes noted in today's physical examination.  Amamda Curbow [name] is a pleasant 71 y.o. female in NAD. AAO x 3. Neurovascular Examination: Capillary refill time to digits immediate b/l. Faintly palpable pedal pulses b/l LE. Pedal hair sparse b/l.  Lower extremity skin temperature gradient within normal limits. No edema noted b/l lower extremities.   Protective sensation intact diminished bilaterally with 10g monofilament b/l.  Dermatological:  Skin warm and supple b/l lower extremities. No open wounds b/l lower extremities. No interdigital macerations b/l lower extremities. Toenails 1-5 right, left great toe and 3-5  left elongated, discolored, dystrophic, thickened, crumbly with subungual debris and tenderness to dorsal palpation.   Anonychia noted L 2nd toe. Nailbed(s) epithelialized.   Porokeratotic lesion plantarlateral midfoot of RLE and bilateral heels with tenderness to palpation. No erythema, no edema, no  drainage, no fluctuance.  Musculoskeletal:  Normal muscle strength 5/5 to all lower extremity muscle groups bilaterally. No pain crepitus or joint limitation noted with ROM b/l lower extremities. HAV with bunion deformity b/l. Uses walker for gait assistance.  Assessment/Plan: 1. Pain due to onychomycosis of toenail   2. Porokeratosis   3. Diabetic polyneuropathy associated with type 2 diabetes mellitus ([place])     No orders of the defined types were placed in this encounter.   -Consent given for treatment as described below: -Examined patient. -Medicaid ABN on file for paring of corn(s)/callus(es)/porokeratos(es). Copy in patient chart. -Stressed the importance of good glycemic control and the detriment of not  controlling glucose levels in relation to the foot. -Patient to continue soft, supportive shoe gear daily. -Toenails 1-5 right foot, 3-5 left foot, and L hallux debrided in length and girth without iatrogenic bleeding with sterile nail nipper and dremel.  -Porokeratotic lesion(s) plantar heel pad of both feet and plantarlateral aspect of midfoot right foot pared and enucleated with sterile currette without incident. Total number of lesions debrided=3. -Patient/POA to call should there be question/concern in the interim.   Return in about 3 months (around 03/07/2022).  Marzetta Board, DPM

## 2021-12-11 ENCOUNTER — Encounter: Payer: Self-pay | Admitting: Family Medicine

## 2021-12-11 DIAGNOSIS — I10 Essential (primary) hypertension: Secondary | ICD-10-CM | POA: Diagnosis not present

## 2021-12-11 DIAGNOSIS — F329 Major depressive disorder, single episode, unspecified: Secondary | ICD-10-CM | POA: Insufficient documentation

## 2021-12-11 DIAGNOSIS — J449 Chronic obstructive pulmonary disease, unspecified: Secondary | ICD-10-CM | POA: Diagnosis not present

## 2021-12-11 NOTE — Assessment & Plan Note (Signed)
Adequate though sub optimal control, no med change Needs to reduce sodium intake

## 2021-12-11 NOTE — Assessment & Plan Note (Signed)
Uncontrolled managed by Psych, family stress a major trigger

## 2021-12-11 NOTE — Progress Notes (Signed)
[name]     MRN: [id number]      DOB: [birth year]   HPI [name] is here for follow up and re-evaluation of chronic medical conditions, medication management and review of any available recent lab and radiology data.  Preventive health is updated, specifically  Cancer screening and Immunization.   Questions or concerns regarding consultations or procedures which the PT has had in the interim are  addressed. The PT denies any adverse reactions to current medications since the last visit.  C/o increased and uncontrolled anxiety and depression due tofamily stress   ROS Denies recent fever or chills. Denies sinus pressure, nasal congestion, ear pain or sore throat. Denies chest congestion, productive cough or wheezing. Denies chest pains, palpitations and leg swelling Denies abdominal pain, nausea, vomiting,diarrhea or constipation.   Denies dysuria, frequency, hesitancy chronic incontinence. Chronic  joint pain,  and limitation in mobility. Denies headaches, seizures,  chronic numbness, and  tingling. C/o increased depression, anxiety and insomnia.due to problems with her gran d daughter Denies skin break down or rash.   PE  BP (!) 142/66   Pulse 69   Ht '4\' 11"'$  (1.499 m)   Wt 164 lb (74.4 kg)   SpO2 93%   BMI 33.12 kg/m   Patient alert and oriented and in no cardiopulmonary distress.  HEENT: No facial asymmetry, EOMI,     Neck decreased ROM Chest: Clear to auscultation bilaterally.  CVS: S1, S2 no murmurs, no S3.Regular rate.  ABD: Soft non tender.   Ext: No edema  MS: decreased  ROM spine, shoulders, hips and knees.  Skin: Intact, no ulcerations or rash noted.  Psych: Good eye contact, normal affect. Memory intact not anxious mildly  depressed appearing.  CNS: CN 2-12 intact, power,  normal throughout.no focal deficits noted.   Assessment & Plan  Essential hypertension Adequate though sub optimal control, no med change Needs to reduce sodium intake  DMII  (diabetes mellitus, type 2) ([place]) [name] is reminded of the importance of commitment to daily physical activity for 30 minutes or more, as able and the need to limit carbohydrate intake to 30 to 60 grams per meal to help with blood sugar control.   The need to take medication as prescribed, test blood sugar as directed, and to call between visits if there is a concern that blood sugar is uncontrolled is also discussed.   [name] is reminded of the importance of daily foot exam, annual eye examination, and good blood sugar, blood pressure and cholesterol control. Managed by Endo,overcorrected, has had several episodes of hypoglycemia     Latest Ref Rng & Units 11/20/2021   11:18 AM 11/09/2021   11:36 AM 10/17/2021    9:37 AM 10/12/2021    9:33 AM 10/05/2021    5:07 AM  Diabetic Labs  Creatinine 0.57 - 1.00 mg/dL 0.93  0.96  1.16  1.11  1.01       12/09/2021    3:45 PM 12/09/2021    3:41 PM 12/09/2021    3:05 PM 12/09/2021    2:53 PM 12/01/2021    1:33 PM 11/21/2021    2:22 PM 11/20/2021   10:14 AM  BP/Weight  Systolic BP 829 562 130 865 784 696 295  Diastolic BP 66 66 64 68 56 65 70  Wt. (Lbs)    164 166.67    BMI    33.12 kg/m2 33.66 kg/m2        Latest Ref Rng &  Units 05/26/2020   12:00 AM 01/01/2019    1:40 PM  Foot/eye exam completion dates  Eye Exam No Retinopathy No Retinopathy       Foot Form Completion   Done     This result is from an external source.        Major depressive disorder Uncontrolled managed by Psych, family stress a major trigger  Mixed hyperlipidemia Hyperlipidemia:Low fat diet discussed and encouraged.   Lipid Panel  Lab Results  Component Value Date   CHOL 155 05/10/2021   HDL 42 05/10/2021   LDLCALC 79 05/10/2021   TRIG 200 (H) 05/10/2021   CHOLHDL 3.7 05/10/2021   Updated lab needed at/ before next visit. Needs to reduce dietary fat

## 2021-12-11 NOTE — Assessment & Plan Note (Signed)
Hyperlipidemia:Low fat diet discussed and encouraged.   Lipid Panel  Lab Results  Component Value Date   CHOL 155 05/10/2021   HDL 42 05/10/2021   LDLCALC 79 05/10/2021   TRIG 200 (H) 05/10/2021   CHOLHDL 3.7 05/10/2021   Updated lab needed at/ before next visit. Needs to reduce dietary fat

## 2021-12-11 NOTE — Assessment & Plan Note (Signed)
[name] is reminded of the importance of commitment to daily physical activity for 30 minutes or more, as able and the need to limit carbohydrate intake to 30 to 60 grams per meal to help with blood sugar control.   The need to take medication as prescribed, test blood sugar as directed, and to call between visits if there is a concern that blood sugar is uncontrolled is also discussed.   [name] is reminded of the importance of daily foot exam, annual eye examination, and good blood sugar, blood pressure and cholesterol control. Managed by Endo,overcorrected, has had several episodes of hypoglycemia     Latest Ref Rng & Units 11/20/2021   11:18 AM 11/09/2021   11:36 AM 10/17/2021    9:37 AM 10/12/2021    9:33 AM 10/05/2021    5:07 AM  Diabetic Labs  Creatinine 0.57 - 1.00 mg/dL 0.93  0.96  1.16  1.11  1.01       12/09/2021    3:45 PM 12/09/2021    3:41 PM 12/09/2021    3:05 PM 12/09/2021    2:53 PM 12/01/2021    1:33 PM 11/21/2021    2:22 PM 11/20/2021   10:14 AM  BP/Weight  Systolic BP 749 449 675 916 384 665 993  Diastolic BP 66 66 64 68 56 65 70  Wt. (Lbs)    164 166.67    BMI    33.12 kg/m2 33.66 kg/m2        Latest Ref Rng & Units 05/26/2020   12:00 AM 01/01/2019    1:40 PM  Foot/eye exam completion dates  Eye Exam No Retinopathy No Retinopathy       Foot Form Completion   Done     This result is from an external source.

## 2021-12-13 ENCOUNTER — Telehealth: Payer: Self-pay

## 2021-12-13 ENCOUNTER — Ambulatory Visit (INDEPENDENT_AMBULATORY_CARE_PROVIDER_SITE_OTHER): Payer: Medicare HMO | Admitting: Family Medicine

## 2021-12-13 ENCOUNTER — Encounter: Payer: Self-pay | Admitting: Family Medicine

## 2021-12-13 DIAGNOSIS — F5102 Adjustment insomnia: Secondary | ICD-10-CM | POA: Diagnosis not present

## 2021-12-13 DIAGNOSIS — L989 Disorder of the skin and subcutaneous tissue, unspecified: Secondary | ICD-10-CM | POA: Diagnosis not present

## 2021-12-13 NOTE — Telephone Encounter (Signed)
Patient called to let Dr Moshe Cipro know she contacted Dr August Albino office about swelling in both feet, that office told her to contact primary pcp and blood pressure.  149/73 150/75  160/80.  Please contact patient.

## 2021-12-13 NOTE — Patient Instructions (Addendum)
F/U as before, call if you need me sooner  You need to get help for  yourself as you go through this stressful situation  Great that you are able to remain nicotine free , despite your stress  Continue therapy with  mental health  No additional medication for sleep is prescribed at this visit   You are referred to Dr Denna Haggard re skin concerns  Thanks for choosing [facility], we consider it a privelige to serve you.

## 2021-12-13 NOTE — Progress Notes (Signed)
Virtual Visit via Telephone Note  I connected with Shatona Andujar Monk on 12/13/21 at 11:40 AM EDT by telephone and verified that I am speaking with the correct person using two identifiers.  Location: Patient: home Provider: office   I discussed the limitations, risks, security and privacy concerns of performing an evaluation and management service by telephone and the availability of in person appointments. I also discussed with the patient that there may be a patient responsible charge related to this service. The patient expressed understanding and agreed to proceed.   History of Present Illness:   c/o multiple skin lesion , not itchy , no discoloration,  on thigh , unable to sl,eep despite high doses of medication, stresed as her home situation is still a concern Observations/Objective: There were no vitals taken for this visit. Good communication with no confusion and intact memory. Alert and oriented x 3 No signs of respiratory distress during speech   Assessment and Plan: Skin lesions, generalized Reports multiple skin lesions generalized, non pruritic, refer Derm for eval  Insomnia due to stress Uncontrolled, ongoing worry regarding situation with gran daighter , advised ongoing mental heal management   Follow Up Instructions:    I discussed the assessment and treatment plan with the patient. The patient was provided an opportunity to ask questions and all were answered. The patient agreed with the plan and demonstrated an understanding of the instructions.   The patient was advised to call back or seek an in-person evaluation if the symptoms worsen or if the condition fails to improve as anticipated.  I provided 8 minutes of non-face-to-face time during this encounter.   Tula Nakayama, MD

## 2021-12-14 NOTE — Telephone Encounter (Signed)
Left voicemail with Drs recommendation and told to call back with any questions/concerns

## 2021-12-15 ENCOUNTER — Inpatient Hospital Stay: Payer: Medicare HMO

## 2021-12-15 ENCOUNTER — Inpatient Hospital Stay: Payer: Medicare HMO | Attending: Hematology

## 2021-12-15 ENCOUNTER — Telehealth: Payer: Medicare HMO | Admitting: Family Medicine

## 2021-12-15 VITALS — BP 132/56 | HR 68 | Temp 96.9°F | Resp 18

## 2021-12-15 DIAGNOSIS — Z79899 Other long term (current) drug therapy: Secondary | ICD-10-CM | POA: Insufficient documentation

## 2021-12-15 DIAGNOSIS — D638 Anemia in other chronic diseases classified elsewhere: Secondary | ICD-10-CM

## 2021-12-15 DIAGNOSIS — D631 Anemia in chronic kidney disease: Secondary | ICD-10-CM | POA: Diagnosis not present

## 2021-12-15 DIAGNOSIS — N1832 Chronic kidney disease, stage 3b: Secondary | ICD-10-CM | POA: Insufficient documentation

## 2021-12-15 DIAGNOSIS — I129 Hypertensive chronic kidney disease with stage 1 through stage 4 chronic kidney disease, or unspecified chronic kidney disease: Secondary | ICD-10-CM | POA: Diagnosis not present

## 2021-12-15 LAB — CBC
HCT: 34.2 % — ABNORMAL LOW (ref 36.0–46.0)
Hemoglobin: 9.8 g/dL — ABNORMAL LOW (ref 12.0–15.0)
MCH: 22.7 pg — ABNORMAL LOW (ref 26.0–34.0)
MCHC: 28.7 g/dL — ABNORMAL LOW (ref 30.0–36.0)
MCV: 79.4 fL — ABNORMAL LOW (ref 80.0–100.0)
Platelets: 247 10*3/uL (ref 150–400)
RBC: 4.31 MIL/uL (ref 3.87–5.11)
RDW: 14.7 % (ref 11.5–15.5)
WBC: 5.9 10*3/uL (ref 4.0–10.5)
nRBC: 0 % (ref 0.0–0.2)

## 2021-12-15 MED ORDER — EPOETIN ALFA 10000 UNIT/ML IJ SOLN
10000.0000 [IU] | Freq: Once | INTRAMUSCULAR | Status: AC
  Administered 2021-12-15: 10000 [IU] via SUBCUTANEOUS
  Filled 2021-12-15: qty 1

## 2021-12-15 NOTE — Patient Instructions (Signed)
[facility]  Discharge Instructions: Thank you for choosing [facility] to provide your oncology and hematology care.  If you have a lab appointment with the [facility], please come in thru the Main Entrance and check in at the main information desk.  Wear comfortable clothing and clothing appropriate for easy access to any Portacath or PICC line.   We strive to give you quality time with your provider. You may need to reschedule your appointment if you arrive late (15 or more minutes).  Arriving late affects you and other patients whose appointments are after yours.  Also, if you miss three or more appointments without notifying the office, you may be dismissed from the clinic at the provider's discretion.      For prescription refill requests, have your pharmacy contact our office and allow 72 hours for refills to be completed.    Today you received Epogen 10,000 units.     BELOW ARE SYMPTOMS THAT SHOULD BE REPORTED IMMEDIATELY: *FEVER GREATER THAN 100.4 F (38 C) OR HIGHER *CHILLS OR SWEATING *NAUSEA AND VOMITING THAT IS NOT CONTROLLED WITH YOUR NAUSEA MEDICATION *UNUSUAL SHORTNESS OF BREATH *UNUSUAL BRUISING OR BLEEDING *URINARY PROBLEMS (pain or burning when urinating, or frequent urination) *BOWEL PROBLEMS (unusual diarrhea, constipation, pain near the anus) TENDERNESS IN MOUTH AND THROAT WITH OR WITHOUT PRESENCE OF ULCERS (sore throat, sores in mouth, or a toothache) UNUSUAL RASH, SWELLING OR PAIN  UNUSUAL VAGINAL DISCHARGE OR ITCHING   Items with * indicate a potential emergency and should be followed up as soon as possible or go to the Emergency Department if any problems should occur.  Please show the CHEMOTHERAPY ALERT CARD or IMMUNOTHERAPY ALERT CARD at check-in to the Emergency Department and triage nurse.  Should you have questions after your visit or need to cancel or reschedule your appointment, please contact [facility] [phone]  and follow the prompts.  Office hours are 8:00 a.m. to 4:30 p.m. Monday - Friday. Please note that voicemails left after 4:00 p.m. may not be returned until the following business day.  We are closed weekends and major holidays. You have access to a nurse at all times for urgent questions. Please call the main number to the clinic [phone] and follow the prompts.  For any non-urgent questions, you may also contact your provider using MyChart. We now offer e-Visits for anyone 55 and older to request care online for non-urgent symptoms. For details visit [email or web address].   Also download the MyChart app! Go to the app store, search "MyChart", open the app, select [facility], and log in with your MyChart username and password.  Masks are optional in the cancer centers. If you would like for your care team to wear a mask while they are taking care of you, please let them know. You may have one support person who is at least 71 years old accompany you for your appointments.

## 2021-12-15 NOTE — Progress Notes (Signed)
[name] presents today for injection per the provider's orders.  Epogen 10,000 units administration without incident; injection site WNL; see MAR for injection details.  Patient tolerated procedure well and without incident.  No questions or complaints noted at this time. Pt's hemoglobin noted to be 9.8 today.   Discharged from clinic ambulatory with walker in stable condition. Alert and oriented x 3. F/U with [facility] as scheduled.

## 2021-12-16 ENCOUNTER — Ambulatory Visit
Admission: EM | Admit: 2021-12-16 | Discharge: 2021-12-16 | Disposition: A | Discharge status: Home / Self Care | Payer: Medicare HMO | Attending: Family Medicine | Admitting: Family Medicine

## 2021-12-16 DIAGNOSIS — M545 Low back pain, unspecified: Secondary | ICD-10-CM

## 2021-12-16 DIAGNOSIS — K644 Residual hemorrhoidal skin tags: Secondary | ICD-10-CM | POA: Diagnosis not present

## 2021-12-16 DIAGNOSIS — R600 Localized edema: Secondary | ICD-10-CM

## 2021-12-16 MED ORDER — HYDROCORTISONE (PERIANAL) 2.5 % EX CREA: 1.0000 | TOPICAL_CREAM | Freq: Two times a day (BID) | CUTANEOUS | 0 refills | Status: DC

## 2021-12-16 MED ORDER — TIZANIDINE HCL 2 MG PO CAPS: 2.0000 mg | ORAL_CAPSULE | Freq: Three times a day (TID) | ORAL | 0 refills | Status: DC

## 2021-12-16 NOTE — ED Triage Notes (Signed)
Pt report rectal pain and having a painful knot x 1 week. Pt thought it was hemorrhoids at the beginning.

## 2021-12-16 NOTE — ED Provider Notes (Signed)
[facility]    CSN: [id number] Arrival date & time: 12/16/21  1155      History   Chief Complaint Chief Complaint  Patient presents with   Rectal Pain    HPI [name] is a 71 y.o. female.   Patient presenting today with a painful area to her right low back for the past week or so.  She is also having some rectal discomfort and thinks she may have hemorrhoids.  She denies injury to either area, bleeding, drainage, change in bowel movement timing or consistency, abdominal pain, fevers.  So far not trying anything other than Tylenol for symptoms.  He is also complaining of ongoing lower leg swelling which she states is not new but seems slightly worse lately.  Denies redness, injury, decreased range of motion    Past Medical History:  Diagnosis Date   Allergy    Anemia    Anemia in chronic kidney disease (CKD) 08/10/2021   Anemia in chronic kidney disease (CKD) 10/12/2021   Anxiety    takes Ativan daily   Arthritis    Assistance needed for mobility    Bipolar disorder ([place])    takes Risperdal nightly   Blood transfusion    Brain tumor ([place])    Cancer ([place])    In her gum   Carpal tunnel syndrome of right wrist 05/23/2011   Cervical disc disorder with radiculopathy of cervical region 10/31/2012   Chronic back pain    Chronic idiopathic constipation    Chronic neck and back pain    Colon polyps    COPD (chronic obstructive pulmonary disease) with chronic bronchitis 09/16/2013   Office Spirometry 10/30/2013-submaximal effort based on appearance of loop and curve. Numbers would fit with severe restriction but her physiologic capability may be better than this. FVC 0.91/44%, and 10.74/45%, FEV1/FVC 0.81, FEF 25-75% 1.43/69%     Diabetes mellitus    Type II   Diverticulosis    TCS 9/08 by Dr. Delfin Edis for diarrhea . Bx for micro scopic colitis negative.    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   Metabolic encephalopathy 26/71/2458   Migraines    chronic headaches   Mononeuritis lower limb    Narcolepsy    Osteoporosis    Pancreatitis 2006   due to Depakote with normal EUS    Paralysis (HCC)    Schatzki's ring    non critical / EGD with ED 8/2011with RMR   Seizures ([place])    takes Lamictal daily.Last seizure 3 yrs ago   Sleep apnea    on CPAP   Small bowel obstruction (HCC)    Stroke ([place])    left sided weakness, speech changes   Tubular adenoma of colon     Patient Active Problem List   Diagnosis Date Noted   Major depressive disorder 12/11/2021   Anemia in chronic kidney disease (CKD) 10/12/2021   Subconjunctival hemorrhage  of left eye 10/02/2021   Dehydration 09/25/2021   Encounter for support and coordination of transition of care 09/25/2021   Acute renal failure superimposed on stage 3a chronic kidney disease ([place]) 09/23/2021   AKI (acute kidney injury) ([place]) 09/16/2021   CHF (congestive heart failure) ([place]) 09/16/2021   Abnormal EKG 09/16/2021   Urinary frequency 09/01/2021   Anemia of chronic disease 08/10/2021   Generalized pain 08/04/2021   Sciatica, right side 07/19/2021   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[place]) 07/10/2021   Atherosclerosis of aorta (HCC) 07/10/2021   Generalized weakness 05/12/2021   Advanced care planning/counseling discussion 12/23/2020   Head trauma, initial encounter 12/20/2020   Trigger finger, right 12/12/2020   Confusion 11/24/2020   Blurry vision 11/18/2020    Cervical radiculopathy 07/09/2020   Closed fracture of lateral malleolus of right fibula 02/27/2020   Headache 02/19/2020   Tubular adenoma of colon 02/09/2019   Benign neoplasm of cerebral meninges (HCC) 11/12/2018   Benign meningioma of brain ([place]) 10/31/2018   Osteoarthritis of both hips 07/06/2018   Lipoma of extremity 12/31/2017   Impingement syndrome of left shoulder region 12/27/2017   Weakness generalized    Leg weakness, bilateral 10/28/2017   Lumbar spondylosis with myelopathy 10/12/2017   Numbness of hand 10/10/2017   Chronic neck pain 07/25/2017   Chronic pain syndrome 07/25/2017   At risk for cardiovascular event 17/35/6701   Acute metabolic encephalopathy 41/05/129   Diabetic polyneuropathy associated with type 2 diabetes mellitus ([place]) 05/19/2016   Tobacco user 04/24/2016   Obesity (BMI 30.0-34.9) 04/24/2016   History of palpitations 08/09/2015   Labile hypertension 08/03/2015   Normal coronary arteries 08/03/2015   Dizziness 07/15/2015   Left-sided low back pain with left-sided sciatica 06/27/2015   Multinodular goiter 05/06/2015   Rectocele, female 04/27/2015   Anal sphincter incontinence 04/27/2015   Pelvic relaxation due to rectocele 03/30/2015   Hyperkalemia 02/22/2015   Pulmonary hypertension ([place]) 02/22/2015   Posterior chest pain 02/21/2015   Episodic cigarette smoking dependence 01/11/2015   Migraine without aura and without status migrainosus, not intractable 07/02/2014   Flatulence 02/18/2014   Microcytic anemia 02/18/2014   Acquired hypothyroidism 08/16/2013   Gastroparesis 04/28/2013   Nicotine dependence 03/09/2013   Seizure disorder ([place]) 01/19/2013   Displacement of cervical intervertebral disc without myelopathy 12/13/2012   Bursitis of shoulder 12/13/2012   Cervical disc disorder with radiculopathy of cervical region 10/31/2012   Solitary pulmonary nodule 08/19/2012   Anemia 07/05/2012   Hypersomnia disorder related to a known organic  factor 06/11/2012   Pruritus 04/18/2012   Meningioma ([place]) 11/19/2011   Mononeuritis leg 10/25/2011   Carpal tunnel syndrome of right wrist 05/23/2011   Chronic pain of right hand 05/04/2011   Polypharmacy 04/28/2011   Mood disorder ([place]) 04/28/2011   Constipation 04/13/2011   Recurrent falls 12/12/2010   Oropharyngeal dysphagia 07/12/2010   Urinary incontinence 12/16/2009   HEARING LOSS 10/26/2009   DMII (diabetes mellitus, type 2) ([place]) 07/07/2009   Mixed hyperlipidemia 12/11/2008   IBS 12/11/2008   GERD 07/29/2008   MILK PRODUCTS ALLERGY 07/29/2008   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/03/2007   Essential hypertension 06/27/2007   Backache 06/19/2007   Osteoporosis 06/19/2007   Obstructive sleep apnea 06/19/2007   TRIGGER FINGER 04/18/2007   DIVERTICULOSIS, COLON 11/13/2006    Past Surgical History:  Procedure Laterality Date   ABDOMINAL HYSTERECTOMY  1978   BACK SURGERY  July 2012  BACTERIAL OVERGROWTH TEST N/A 05/05/2013   Procedure: BACTERIAL OVERGROWTH TEST;  Surgeon: Daneil Dolin, MD;  [facility];  Service: Endoscopy;  Laterality: N/A;  7:30   BIOPSY THYROID  2009   BRAIN SURGERY  11/2011   resection of meningioma   BREAST REDUCTION SURGERY  1994   CARDIAC CATHETERIZATION  05/10/2005   normal coronaries, normal LV systolic function and EF (Dr. Jackie Plum)   [place] Left 07/22/04   Dr. Aline Brochure   CATARACT EXTRACTION Bilateral    CHOLECYSTECTOMY  1984   COLONOSCOPY N/A 09/25/2012   WUJ:[id number] diverticulosis.  colonic polyp-removed : tubular adenoma   CRANIOTOMY  11/23/2011   Procedure: CRANIOTOMY TUMOR EXCISION;  Surgeon: Hosie Spangle, MD;  Location: [facility];  Service: Neurosurgery;  Laterality: N/A;  Craniotomy for tumor resection   ESOPHAGOGASTRODUODENOSCOPY  12/29/2010   [place]-Retained food in the esophagus and stomach, small hiatal hernia, status post Maloney dilation of the esophagus    ESOPHAGOGASTRODUODENOSCOPY N/A 09/25/2012   WGN:[id number] atonic baggy esophagus status post Maloney dilation 22 F. Hiatal hernia   GIVENS CAPSULE STUDY N/A 01/15/2013   NORMAL.    IR GENERIC HISTORICAL  03/17/2016   IR RADIOLOGIST EVAL & MGMT 03/17/2016 [facility]   LESION REMOVAL N/A 05/31/2015   Procedure: REMOVAL RIGHT AND LEFT LESIONS OF MANDIBLE;  Surgeon: Diona Browner, DDS;  Location: [facility];  Service: Oral Surgery;  Laterality: N/A;   MALONEY DILATION  12/29/2010   RMR;   NM MYOCAR PERF WALL MOTION  2006   "relavtiely normal" persantine, mild anterior thinning (breast attenuation artifact), no region of scar/ischemia   OVARIAN CYST REMOVAL     RECTOCELE REPAIR N/A 06/29/2015   Procedure: POSTERIOR REPAIR (RECTOCELE);  Surgeon: Jonnie Kind, MD;  Location: [facility];  Service: Gynecology;  Laterality: N/A;   REDUCTION MAMMAPLASTY Bilateral    SPINE SURGERY  09/29/2010   Dr. Rolena Infante   surgical excision of 3 tumors from right thigh and right buttock  and left upper thigh  2010   TOOTH EXTRACTION Bilateral 12/14/2014   Procedure: REMOVAL OF BILATERAL MANDIBULAR EXOSTOSES;  Surgeon: Diona Browner, DDS;  Location: [facility];  Service: Oral Surgery;  Laterality: Bilateral;   TRANSTHORACIC ECHOCARDIOGRAM  2010   EF 60-65%, mild conc LVH, grade 1 diastolic dysfunction; mildly calcified MV annulus with mildly thickened leaflets, mildly calcified MR annulus    OB History     Gravida  6   Para  1   Term  1   Preterm      AB  5   Living         SAB  5   IAB      Ectopic      Multiple      Live Births               Home Medications    Prior to Admission medications   Medication Sig Start Date End Date Taking? Authorizing Provider  hydrocortisone (ANUSOL-HC) 2.5 % rectal cream Place 1 Application rectally 2 (two) times daily. 12/16/21  Yes Volney American, PA-C  tizanidine (ZANAFLEX) 2 MG capsule Take 1 capsule (2 mg total) by mouth 3 (three) times daily. Do not drink  alcohol or drive while taking this medication.  May cause drowsiness. 12/16/21  Yes Volney American, PA-C  ACCU-CHEK GUIDE test strip TEST BLOOD SUGAR FOUR TIMES DAILY  AND AS NEEDED 05/30/21   Philemon Kingdom, MD  Accu-Chek Softclix Lancets  lancets TEST BLOOD SUGAR THREE TIMES DAILY AS DIRECTED 03/28/21   Philemon Kingdom, MD  acetaminophen (TYLENOL) 325 MG tablet Take 650 mg by mouth every 6 (six) hours as needed. Patient not taking: Reported on 12/13/2021    [provider]  albuterol (PROVENTIL) (2.5 MG/3ML) 0.083% nebulizer solution Take 3 mLs (2.5 mg total) by nebulization every 6 (six) hours as needed for wheezing or shortness of breath. 11/25/21   Icard, Leory Plowman L, DO  albuterol (PROVENTIL) (2.5 MG/3ML) 0.083% nebulizer solution Take 3 mLs (2.5 mg total) by nebulization every 6 (six) hours as needed for wheezing or shortness of breath. 11/25/21   [name], MD  albuterol (VENTOLIN HFA) 108 (90 Base) MCG/ACT inhaler INHALE 1 TO 2 PUFFS EVERY 6 HOURS AS NEEDED FOR WHEEZING, SHORTNESS OF BREATH Patient taking differently: Inhale 1-2 puffs into the lungs every 6 (six) hours as needed for wheezing or shortness of breath. 06/24/21   Deneise Lever, MD  Alcohol Swabs (DROPSAFE ALCOHOL PREP) 70 % PADS USE TO CLEAN FINGER PRIOR TO TESTING FOR BLOOD SUGAR  AS DIRECTED 05/20/21   Philemon Kingdom, MD  alendronate (FOSAMAX) 70 MG tablet TAKE 1 TABLET EVERY 7 DAYS ON AN EMPTY STOMACH WITH A FULL GLASS OF WATER Patient taking differently: Take 70 mg by mouth once a week. 11/07/21   [name], MD  amLODipine (NORVASC) 10 MG tablet TAKE 1 TABLET EVERY DAY Patient taking differently: Take 10 mg by mouth daily. 06/06/21   Imogene Burn, PA-C  ascorbic acid (VITAMIN C) 500 MG tablet Take 500 mg by mouth daily.    [provider]  aspirin EC 81 MG tablet Take 1 tablet (81 mg total) by mouth daily with breakfast. 12/08/18   Roxan Hockey, MD  Blood Glucose Calibration  (ACCU-CHEK GUIDE CONTROL) LIQD USE AS DIRECTED 12/13/20   Philemon Kingdom, MD  blood glucose meter kit and supplies Dispense based on patient and insurance preference. Use up to four times daily as directed. (FOR ICD-10 E10.9, E11.9). 06/27/19   Philemon Kingdom, MD  buPROPion (WELLBUTRIN XL) 150 MG 24 hr tablet Take 1 tablet (150 mg total) by mouth every morning. 12/07/21 12/07/22  Cloria Spring, MD  Calcium Carb-Cholecalciferol (CALTRATE 600+D3 SOFT) 600-20 MG-MCG CHEW Chew 1 tablet by mouth daily at 12 noon.    [provider]  cetirizine (ZYRTEC) 10 MG tablet Take 1 tablet (10 mg total) by mouth daily. 04/25/21   Renee Rival, FNP  Cholecalciferol (D3 PO) Take 1 tablet by mouth daily.    [provider]  Clobetasol Prop Emollient Base (CLOBETASOL PROPIONATE E) 0.05 % emollient cream Apply to affected area qd Patient taking differently: Apply 1 Application topically daily. 01/24/21   Lavonna Monarch, MD  Continuous Blood Gluc Sensor (FREESTYLE LIBRE 14 DAY SENSOR) MISC 1 each by Does not apply route every 14 (fourteen) days. Change every 2 weeks 06/24/19   Philemon Kingdom, MD  diclofenac Sodium (VOLTAREN) 1 % GEL APPLY 2 GRAMS TOPICALLY FOUR TIMES DAILY. Patient taking differently: Apply 2 g topically 4 (four) times daily. 11/04/21   Esterwood, Amy S, PA-C  Docusate Sodium (DSS) 100 MG CAPS Take 1 tablet by mouth in the morning and at bedtime.    [provider]  doxycycline (VIBRAMYCIN) 100 MG capsule Take 1 capsule (100 mg total) by mouth 2 (two) times daily. 11/20/21   Raspet, Erin K, PA-C  DROPLET PEN NEEDLES 31G X 8 MM MISC USE FOR INJECTING INSULIN 4  TIMES DAILY. 09/02/21   Philemon Kingdom, MD  ezetimibe (ZETIA) 10 MG tablet TAKE 1 TABLET EVERY DAY Patient taking differently: Take 10 mg by mouth daily. 06/22/21   [name], MD  insulin aspart (NOVOLOG FLEXPEN) 100 UNIT/ML FlexPen Inject 5 Units into the skin 3 (three) times daily with meals. Give if  eats 50% or more of meal. Patient taking differently: Inject 14 Units into the skin 3 (three) times daily with meals. Give if eats 50% or more of meal. 09/17/21   Johnson, Clanford L, MD  insulin glargine, 2 Unit Dial, (TOUJEO MAX SOLOSTAR) 300 UNIT/ML Solostar Pen Inject 20-25 Units into the skin daily. 09/29/21   Philemon Kingdom, MD  JARDIANCE 10 MG TABS tablet Take 10 mg by mouth daily. 10/31/21   [provider]  lactobacillus acidophilus (BACID) TABS tablet Take 2 tablets by mouth 3 (three) times daily.    [provider]  lamoTRIgine (LAMICTAL) 100 MG tablet Take 1 tablet (100 mg total) by mouth 2 (two) times daily. 12/07/21   Cloria Spring, MD  levothyroxine (SYNTHROID) 50 MCG tablet TAKE 1 TABLET EVERY DAY FOR 6 DAYS PER WEEK, 1/2 TABLET 1 DAY PER WEEK 07/07/21   Philemon Kingdom, MD  LORazepam (ATIVAN) 0.5 MG tablet Take 1 tablet (0.5 mg total) by mouth 2 (two) times daily. 12/07/21   Cloria Spring, MD  losartan (COZAAR) 50 MG tablet TAKE 1 TABLET EVERY DAY 10/07/21   [name], MD  meclizine (ANTIVERT) 25 MG tablet Take 1 tablet (25 mg total) by mouth 3 (three) times daily as needed for dizziness. 04/28/20   Noreene Larsson, NP  metoprolol tartrate (LOPRESSOR) 50 MG tablet TAKE 1 TABLET TWICE DAILY (NEED MD APPOINTMENT) Patient taking differently: Take 50 mg by mouth 2 (two) times daily. 08/29/21   Strader, Fransisco Hertz, PA-C  Misc. Devices (MATTRESS PAD) MISC FIRM MATTRESS PAD for hospital bed x 1 02/22/21   [name], MD  montelukast (SINGULAIR) 10 MG tablet TAKE 1 TABLET EVERY DAY Patient taking differently: Take 10 mg by mouth at bedtime. 05/20/21   [name], MD  Multiple Vitamins-Minerals (MULTIVITAMIN GUMMIES ADULT PO) Take 1 tablet by mouth daily.    [provider]  MYRBETRIQ 25 MG TB24 tablet TAKE 1 TABLET EVERY DAY 10/07/21   [name], MD  nystatin (MYCOSTATIN/NYSTOP) powder APPLY TO AFFECTED AREA 4 TIMES  DAILY. Patient taking differently: Apply 1 Application topically in the morning, at noon, in the evening, and at bedtime. 09/12/21   [name], MD  Omega-3 Fatty Acids (FISH OIL PO) Take 1 capsule by mouth daily.    [provider]  ondansetron (ZOFRAN) 4 MG tablet Take 1 tablet (4 mg total) by mouth every 8 (eight) hours as needed for nausea or vomiting. 07/19/21   Alvira Monday, FNP  oxyCODONE-acetaminophen (PERCOCET) 10-325 MG tablet Take 1 tablet by mouth every 8 (eight) hours as needed for pain. 11/01/20   [provider]  predniSONE (DELTASONE) 20 MG tablet Take 1 tablet (20 mg total) by mouth daily. 11/20/21   Raspet, Derry Skill, PA-C  pregabalin (LYRICA) 75 MG capsule Take 1 capsule (75 mg total) by mouth daily. 10/20/16   Rexene Alberts, MD  RABEprazole (ACIPHEX) 20 MG tablet TAKE 1 TABLET TWICE DAILY 11/09/21   Pyrtle, Lajuan Lines, MD  RESTASIS 0.05 % ophthalmic emulsion Place 2 drops into both eyes daily. 07/29/20   [provider]  risperiDONE (RISPERDAL) 0.5 MG  tablet Take 1 tablet (0.5 mg total) by mouth at bedtime. 12/07/21   Cloria Spring, MD  rosuvastatin (CRESTOR) 5 MG tablet TAKE 1 TABLET AT BEDTIME. 11/07/21   [name], MD  sertraline (ZOLOFT) 100 MG tablet Take 1 tablet (100 mg total) by mouth every morning. 12/07/21   Cloria Spring, MD  STIOLTO RESPIMAT 2.5-2.5 MCG/ACT AERS INHALE 2 PUFFS INTO THE LUNGS DAILY. 05/31/20   Parrett, Fonnie Mu, NP  telmisartan (MICARDIS) 20 MG tablet Take 10 mg by mouth daily. 11/23/21   [provider]  traZODone (DESYREL) 150 MG tablet Take 1 tablet (150 mg total) by mouth at bedtime. 12/07/21   Cloria Spring, MD  vitamin E 180 MG (400 UNITS) capsule Take 400 Units by mouth daily.    [provider]    Family History Family History  Problem Relation Age of Onset   Heart attack Mother        HTN   Pneumonia Father    Kidney failure Father    Diabetes Father    Pancreatic cancer Sister     Cancer Sister        breast    Cancer Sister        pancreatic   Diabetes Brother    Hypertension Brother    Diabetes Brother    Alcohol abuse Maternal Uncle    Stroke Maternal Grandmother    Heart attack Maternal Grandfather    Hypertension Son    Sleep apnea Son    Colon cancer Neg Hx    Anesthesia problems Neg Hx    Hypotension Neg Hx    Malignant hyperthermia Neg Hx    Pseudochol deficiency Neg Hx    Breast cancer Neg Hx    Stomach cancer Neg Hx     Social History Social History   Tobacco Use   Smoking status: Former    Packs/day: 0.25    Years: 30.00    Total pack years: 7.50    Types: Cigarettes    Quit date: 07/05/2021    Years since quitting: 0.4    Passive exposure: Past   Smokeless tobacco: Never   Tobacco comments:    3-4 a day MRC 05/17/2021  Vaping Use   Vaping Use: Never used  Substance Use Topics   Alcohol use: No    Alcohol/week: 0.0 standard drinks of alcohol    Comment:     Drug use: No     Allergies   Iron, Milk (cow), Penicillins, Phenazopyridine, Phenazopyridine hcl, Cephalexin, Flonase [fluticasone], Milk-related compounds, and Phenazopyridine hcl   Review of Systems Review of Systems Per HPI  Physical Exam Triage Vital Signs ED Triage Vitals  Enc Vitals Group     BP 12/16/21 1219 137/63     Pulse Rate 12/16/21 1219 71     Resp 12/16/21 1219 (!) 24     Temp 12/16/21 1219 98.6 F (37 C)     Temp Source 12/16/21 1219 Oral     SpO2 12/16/21 1219 92 %     Weight --      Height --      Head Circumference --      Peak Flow --      Pain Score 12/16/21 1220 8     Pain Loc --      Pain Edu? --      Excl. in [place]? --    No data found.  Updated Vital Signs BP 137/63 (BP Location: Right Arm)  Pulse 71   Temp 98.6 F (37 C) (Oral)   Resp (!) 24   SpO2 92%   Visual Acuity Right Eye Distance:   Left Eye Distance:   Bilateral Distance:    Right Eye Near:   Left Eye Near:    Bilateral Near:     Physical Exam Vitals and  nursing note reviewed. Exam conducted with a chaperone present.  Constitutional:      Appearance: Normal appearance. She is not ill-appearing.  HENT:     Head: Atraumatic.  Eyes:     Extraocular Movements: Extraocular movements intact.     Conjunctiva/sclera: Conjunctivae normal.  Cardiovascular:     Rate and Rhythm: Normal rate and regular rhythm.     Heart sounds: Normal heart sounds.  Pulmonary:     Effort: Pulmonary effort is normal.     Breath sounds: Normal breath sounds.  Abdominal:     General: Bowel sounds are normal. There is no distension.     Palpations: Abdomen is soft.     Tenderness: There is no abdominal tenderness. There is no guarding.  Genitourinary:    Comments: External hemorrhoids present, no bleeding, redness, or significant tenderness to palpation Musculoskeletal:        General: Swelling present.     Cervical back: Normal range of motion and neck supple.     Comments: Ambulates with a walker, range of motion at baseline.  Tenderness to palpation without obvious mass or bony abnormality to the right lateral musculature Trace edema to bilateral lower legs symmetrically.  Negative squeeze test and Homans' sign  Skin:    General: Skin is warm and dry.  Neurological:     Mental Status: She is alert and oriented to person, place, and time.  Psychiatric:        Mood and Affect: Mood normal.        Thought Content: Thought content normal.        Judgment: Judgment normal.      UC Treatments / Results  Labs (all labs ordered are listed, but only abnormal results are displayed) Labs Reviewed - No data to display  EKG   Radiology No results found.  Procedures Procedures (including critical care time)  Medications Ordered in UC Medications - No data to display  Initial Impression / Assessment and Plan / UC Course  I have reviewed the triage vital signs and the nursing notes.  Pertinent labs & imaging results that were available during my care of  the patient were reviewed by me and considered in my medical decision making (see chart for details).     We will treat with Anusol cream for the external hemorrhoids, tizanidine and Tylenol for the back pain.  Compression stockings for the lower leg edema.  Close follow-up with PCP recommended.  ED for severely worsening symptoms. Final Clinical Impressions(s) / UC Diagnoses   Final diagnoses:  Acute right-sided low back pain without sciatica  External hemorrhoid  Bilateral leg edema   Discharge Instructions   None    ED Prescriptions     Medication Sig Dispense Auth. Provider   hydrocortisone (ANUSOL-HC) 2.5 % rectal cream Place 1 Application rectally 2 (two) times daily. 60 g Volney American, [place]   tizanidine (ZANAFLEX) 2 MG capsule Take 1 capsule (2 mg total) by mouth 3 (three) times daily. Do not drink alcohol or drive while taking this medication.  May cause drowsiness. 15 capsule Volney American, [place]      PDMP  not reviewed this encounter.   Volney American, [place] 12/16/21 1359

## 2021-12-19 ENCOUNTER — Ambulatory Visit (INDEPENDENT_AMBULATORY_CARE_PROVIDER_SITE_OTHER): Payer: Medicare HMO | Admitting: Psychiatry

## 2021-12-19 ENCOUNTER — Encounter: Payer: Self-pay | Admitting: Family Medicine

## 2021-12-19 DIAGNOSIS — F331 Major depressive disorder, recurrent, moderate: Secondary | ICD-10-CM

## 2021-12-19 DIAGNOSIS — Z79891 Long term (current) use of opiate analgesic: Secondary | ICD-10-CM | POA: Diagnosis not present

## 2021-12-19 DIAGNOSIS — L989 Disorder of the skin and subcutaneous tissue, unspecified: Secondary | ICD-10-CM | POA: Insufficient documentation

## 2021-12-19 DIAGNOSIS — M5030 Other cervical disc degeneration, unspecified cervical region: Secondary | ICD-10-CM | POA: Diagnosis not present

## 2021-12-19 DIAGNOSIS — Z79899 Other long term (current) drug therapy: Secondary | ICD-10-CM | POA: Diagnosis not present

## 2021-12-19 DIAGNOSIS — M5412 Radiculopathy, cervical region: Secondary | ICD-10-CM | POA: Diagnosis not present

## 2021-12-19 DIAGNOSIS — F5102 Adjustment insomnia: Secondary | ICD-10-CM | POA: Insufficient documentation

## 2021-12-19 DIAGNOSIS — Z5181 Encounter for therapeutic drug level monitoring: Secondary | ICD-10-CM | POA: Diagnosis not present

## 2021-12-19 NOTE — Assessment & Plan Note (Signed)
Uncontrolled, ongoing worry regarding situation with gran daighter , advised ongoing mental heal management

## 2021-12-19 NOTE — Progress Notes (Unsigned)
Virtual Visit via Telephone Note  I connected with Rossi Silvestro Sochacki on 12/19/21 at 4:06 PM EDT  by telephone and verified that I am speaking with the correct person using two identifiers.  Location: Patient: Home Provider: [facility] office    I discussed the limitations, risks, security and privacy concerns of performing an evaluation and management service by telephone and the availability of in person appointments. I also discussed with the patient that there may be a patient responsible charge related to this service. The patient expressed understanding and agreed to proceed.   I provided 49 minutes of non-face-to-face time during this encounter.   [name], LCSW             THERAPIST PROGRESS NOTE    Session Time: Monday 12/19/2021  4:06 PM - 4:55 PM   Participation Level: Active  Behavioral Response: less depressed, talkative, alert tearful  Type of Therapy: Individual Therapy  Treatment Goals addressed: Patient will score less than 10 on the patient health questionnaire  Progress on goals: Progressing  Interventions: CBT and Supportive             Summary: Cherylanne Ardelean Coreas is a 71 y.o. female who presents with  long standing history of recurrent periods  of depression beginning when she was 30 and her favorite uncle died. Patient reports multiple psychiatric hospitlaizations due to depression and suicidal ideations with the last one occuring in 1997. Patient has participated in outpatient psychotherapy and medication management intermittently since age 77.  She currently is seeing psychiatrist Dr. Harrington Challenger . Prior to this, she was seen at [facility]. Patient also has had ECT at [facility]. Symptoms have worsened in recent months due to family stress and have  included depressed mood, anxiety, excessive worry, and tearfulness.            Patient's last contact was by virtual visit via telephone about 2 weeks ago.  Patient reports continued symptoms of depression  but decreased intensity and frequency.  She reports decreased crying spells.  She continues to worry about her granddaughter.  However, patient's son (granddaughter's father) has had contact with the granddaughter who may be seeing her father this weekend.  Patient expresses frustration as she states feeling as though she is being excluded.  She is trying to resume involvement in activities and has resumed attending church.  She continues to have health concerns including chronic pain states having good days and bad days.  She also continues to express frustration as she does not have more frequent contact from her grandson.    Suicidal/Homicidal: Nowithout intent/plan      Therapist Response: Reviewed symptoms, administered PHQ 2 and 9, discussed stressors, facilitated expression of thoughts and feelings, validated feelings, praised and reinforced patient's efforts to resolve involvement in activity, discussed effects, assisted patient identify realistic expectations regarding contact with granddaughter, assisted patient identify and replace unhelpful thoughts with more helpful thoughts, encouraged patient to continue to use support system, assisted patient identify realistic expectations regarding interaction with grandson, assisted patient identify ways within her capability/control that she continues to nurture her relationship with grandson, Diagnosis: Axis I: MDD, Recurrent, moderate    Axis II: Deferred Collaboration of Care: Psychiatrist AEB patient working with psychiatrist Dr. Harrington Challenger  Patient/Guardian was advised Release of Information must be obtained prior to any record release in order to collaborate their care with an outside provider. Patient/Guardian was advised if they have not already done so to contact the registration department  to sign all necessary forms in order for [name] to release information regarding their care.   Consent: Patient/Guardian gives verbal consent for treatment and  assignment of benefits for services provided during this visit. Patient/Guardian expressed understanding and agreed to proceed.    Colsen Modi E Montrice Gracey, LCSW

## 2021-12-19 NOTE — Assessment & Plan Note (Signed)
Reports multiple skin lesions generalized, non pruritic, refer Derm for eval

## 2021-12-20 ENCOUNTER — Ambulatory Visit (INDEPENDENT_AMBULATORY_CARE_PROVIDER_SITE_OTHER): Payer: Medicare HMO | Admitting: Family Medicine

## 2021-12-20 ENCOUNTER — Encounter: Payer: Self-pay | Admitting: Family Medicine

## 2021-12-20 VITALS — BP 138/72 | HR 70 | Wt 168.0 lb

## 2021-12-20 DIAGNOSIS — M4716 Other spondylosis with myelopathy, lumbar region: Secondary | ICD-10-CM

## 2021-12-20 DIAGNOSIS — M5416 Radiculopathy, lumbar region: Secondary | ICD-10-CM

## 2021-12-20 DIAGNOSIS — R600 Localized edema: Secondary | ICD-10-CM

## 2021-12-20 DIAGNOSIS — M25551 Pain in right hip: Secondary | ICD-10-CM | POA: Diagnosis not present

## 2021-12-20 DIAGNOSIS — E162 Hypoglycemia, unspecified: Secondary | ICD-10-CM | POA: Insufficient documentation

## 2021-12-20 LAB — GLUCOSE, POCT (MANUAL RESULT ENTRY)
POC Glucose: 37 mg/dl — AB (ref 70–99)
POC Glucose: 62 mg/dl — AB (ref 70–99)

## 2021-12-20 MED ORDER — POTASSIUM CHLORIDE CRYS ER 10 MEQ PO TBCR: 10.0000 meq | EXTENDED_RELEASE_TABLET | Freq: Two times a day (BID) | ORAL | 1 refills | Status: DC

## 2021-12-20 MED ORDER — FUROSEMIDE 20 MG PO TABS: 20.0000 mg | ORAL_TABLET | Freq: Every day | ORAL | 1 refills | Status: DC

## 2021-12-20 NOTE — Patient Instructions (Addendum)
F/u in 4 to 5 weeks, call if you need me sooner  New for leg swelling os faily furosemoids and potassium  Blood sugar is TOO LOW, you need an appointment with your diabetes Specialist for medication a adjustment  I am referring you to Orthopedics re left hip pain  Careful not to fall  Nurse pls re check blood suagr and document improved blood sugar before she leaves,. Needs a to be over 80  Thanks for choosing [facility], we consider it a privelige to serve you. Update HBA1C, cmp and EGFR , lipid panel

## 2021-12-21 ENCOUNTER — Telehealth: Payer: Self-pay | Admitting: Family Medicine

## 2021-12-21 ENCOUNTER — Other Ambulatory Visit: Payer: Self-pay

## 2021-12-21 DIAGNOSIS — E1143 Type 2 diabetes mellitus with diabetic autonomic (poly)neuropathy: Secondary | ICD-10-CM

## 2021-12-21 DIAGNOSIS — R600 Localized edema: Secondary | ICD-10-CM | POA: Insufficient documentation

## 2021-12-21 NOTE — Assessment & Plan Note (Signed)
Presents with localized tenderness over right hip, had a fall end August where she reported right hip trauma, X ray in ED , negtive for fracture persist localized pain, refer Ortho

## 2021-12-21 NOTE — Telephone Encounter (Signed)
Pls call to check how her blood sugar has been overnight and this am Needs HBA1C, chem 7  and EGFR as soon as possible. May need temporary reduction in blood sugar meds till she sees Endo/  I wll send message to ehr Endo, has had recurrent hypo and reports poor appetite as soon as HBA1C is available, get labs today if possible , does not have to fast

## 2021-12-21 NOTE — Assessment & Plan Note (Signed)
Right hip pain likeley referred from lumbar spine, negative x ray of hip following fall

## 2021-12-21 NOTE — Assessment & Plan Note (Signed)
Blood sugar over corrected, most recent HBA1C was 5.5, followed by Endo,snack povided in office with improvement in blood sugar , wil recommend reduction in meds in the interim, reports poor appetite

## 2021-12-21 NOTE — Assessment & Plan Note (Signed)
Furosemide an potassium daily as needed

## 2021-12-21 NOTE — Progress Notes (Signed)
   [name]     MRN: [id number]      DOB: September 05, 1950   HPI [name] is here c/o pain on right  buttock worse with movement x 2 weeks, thinks she has a spot in the area causing the problem Denies fever, chills , bloody or purulent drainage, no recent traumas  C/o feeling increasingly weak while at office stated that blood sugar earlier today was 177 Seen 4 days ago at urgent care with same complaints   ROS Denies recent fever or chills. Denies sinus pressure, nasal congestion, ear pain or sore throat. Denies chest congestion, productive cough or wheezing. Denies chest pains, palpitations c/o  leg swelling, denies PND or orthopnea Chronic  joint pain, swelling and limitation in mobility. Denies headaches, seizures, numbness, or tingling. Chronic depression, anxiety and  insomnia.  PE  BP 138/72 (BP Location: Right Arm, Patient Position: Sitting)   Pulse 70   Wt 168 lb (76.2 kg)   SpO2 96%   BMI 33.93 kg/m  Blood glucose 37   Patient drowsy and oriented and in no cardiopulmonary distress.  HEENT: No facial asymmetry, EOMI,     Neck supple .  Chest: Clear to auscultation bilaterally.  CVS: S1, S2 no murmurs, no S3.Regular rate.  ABD: Soft non tender.   Ext: Trace bilateral edema  MS: decreased  ROM spine, shoulders, hips and knees.Tender over right  SI joint  Skin: Intact, no ulcerations or rash noted.  Psych: Good eye contact, normal affect. Memory intact not anxious or depressed appearing.  CNS: CN 2-12 intact, power,  normal throughout.no focal deficits noted.   Assessment & Plan  Hypoglycemia Blood sugar over corrected, most recent HBA1C was 5.5, followed by Endo,snack povided in office with improvement in blood sugar , wil recommend reduction in meds in the interim, reports poor appetite   Hip pain, right Presents with localized tenderness over right hip, had a fall end August where she reported right hip trauma, X ray in ED , negtive for fracture persist  localized pain, refer Ortho  Lumbar spondylosis with myelopathy Right hip pain likeley referred from lumbar spine, negative x ray of hip following fall  Bilateral leg edema Furosemide an potassium daily as needed

## 2021-12-22 ENCOUNTER — Telehealth: Payer: Self-pay | Admitting: Family Medicine

## 2021-12-22 NOTE — Telephone Encounter (Signed)
Pt FYI   Pt called stating she is unable to come in today to get her lab work. States she will try to come tomorrow.

## 2021-12-23 ENCOUNTER — Ambulatory Visit (INDEPENDENT_AMBULATORY_CARE_PROVIDER_SITE_OTHER): Payer: Medicare HMO

## 2021-12-23 DIAGNOSIS — E1143 Type 2 diabetes mellitus with diabetic autonomic (poly)neuropathy: Secondary | ICD-10-CM | POA: Diagnosis not present

## 2021-12-23 DIAGNOSIS — Z23 Encounter for immunization: Secondary | ICD-10-CM

## 2021-12-24 LAB — BMP8+EGFR
BUN/Creatinine Ratio: 22 (ref 12–28)
BUN: 30 mg/dL — ABNORMAL HIGH (ref 8–27)
CO2: 24 mmol/L (ref 20–29)
Calcium: 8.9 mg/dL (ref 8.7–10.3)
Chloride: 106 mmol/L (ref 96–106)
Creatinine, Ser: 1.35 mg/dL — ABNORMAL HIGH (ref 0.57–1.00)
Glucose: 143 mg/dL — ABNORMAL HIGH (ref 70–99)
Potassium: 4.4 mmol/L (ref 3.5–5.2)
Sodium: 144 mmol/L (ref 134–144)
eGFR: 42 mL/min/{1.73_m2} — ABNORMAL LOW (ref >59)

## 2021-12-24 LAB — HEMOGLOBIN A1C
Est. average glucose Bld gHb Est-mCnc: 117 mg/dL
Hgb A1c MFr Bld: 5.7 % — ABNORMAL HIGH (ref 4.8–5.6)

## 2021-12-27 ENCOUNTER — Telehealth: Payer: Self-pay | Admitting: Internal Medicine

## 2021-12-27 ENCOUNTER — Ambulatory Visit (INDEPENDENT_AMBULATORY_CARE_PROVIDER_SITE_OTHER): Payer: Medicare HMO | Admitting: *Deleted

## 2021-12-27 DIAGNOSIS — E1143 Type 2 diabetes mellitus with diabetic autonomic (poly)neuropathy: Secondary | ICD-10-CM

## 2021-12-27 DIAGNOSIS — I10 Essential (primary) hypertension: Secondary | ICD-10-CM

## 2021-12-27 DIAGNOSIS — J449 Chronic obstructive pulmonary disease, unspecified: Secondary | ICD-10-CM

## 2021-12-27 NOTE — Chronic Care Management (AMB) (Signed)
Chronic Care Management   CCM RN Visit Note  12/27/2021 Name: [name] MRN: [id number] DOB: 01-Dec-1950  Subjective: [name] is a 71 y.o. year old female who is a primary care patient of [name], MD. The care management team was consulted for assistance with disease management and care coordination needs.    Engaged with patient by telephone for follow up visit in response to provider referral for case management and/or care coordination services.   Consent to Services:  The patient was given information about Chronic Care Management services, agreed to services, and gave verbal consent prior to initiation of services.  Please see initial visit note for detailed documentation.   Patient agreed to services and verbal consent obtained.   Assessment: Review of patient past medical history, allergies, medications, health status, including review of consultants reports, laboratory and other test data, was performed as part of comprehensive evaluation and provision of chronic care management services.   SDOH (Social Determinants of Health) assessments and interventions performed:  SDOH Interventions    Flowsheet Row Chronic Care Management from 08/23/2021 in [facility] Patient Outreach Telephone from 05/21/2019 in [place] Patient Outreach Telephone from 05/13/2019 in [place] Patient Outreach Telephone from 01/15/2019 in [facility] Patient Outreach Telephone from 12/24/2018 in [facility] Patient Outreach Telephone from 12/11/2018 in [place] Interventions Intervention Not Indicated -- -- -- -- --  Housing Interventions Intervention Not Indicated -- -- -- -- --  Transportation Interventions Intervention Not Indicated -- -- -- -- --  Depression Interventions/Treatment  -- Medication, Counseling, Currently on Treatment Medication, Counseling, Currently  on Treatment Medication, Counseling, Currently on Treatment Medication, Counseling, Currently on Treatment Medication, Counseling, Currently on Treatment        CCM Care Plan  Allergies  Allergen Reactions   Iron Nausea And Vomiting    And itching And itching   Milk ([place]ow) Rash    Doesn't agree with stomach.  Doesn't agree with stomach.  Doesn't agree with stomach.    Penicillins Hives    Has patient had a PCN reaction causing immediate rash, facial/tongue/throat swelling, SOB or lightheadedness with hypotension: Yes Has patient had a PCN reaction causing severe rash involving mucus membranes or skin necrosis: No Has patient had a PCN reaction that required hospitalization No Has patient had a PCN reaction occurring within the last 10 years: No If all of the above answers are "NO", then may proceed with Cephalosporin use.  Other reaction(s): Other (see comments) Has patient had a PCN reaction causing immediate rash, facial/tongue/throat swelling, SOB or lightheadedness with hypotension: Yes Has patient had a PCN reaction causing severe rash involving mucus membranes or skin necrosis: No Has patient had a PCN reaction that required hospitalization No Has patient had a PCN reaction occurring within the last 10 years: No If all of the above answers are "NO", then may proceed with Cephalosporin use. Has patient had a PCN reaction causing immediate rash, facial/tongue/throat swelling, SOB or lightheadedness with hypotension: Yes Has patient had a PCN reaction causing severe rash involving mucus membranes or skin necrosis: No Has patient had a PCN reaction that required hospitalization No Has patient had a PCN reaction occurring within the last 10 years: No If all of the above answers are "NO", then may proceed with Cephalosporin use. Has patient had a PCN reaction causing immediate rash, facial/tongue/throat swelling, SOB or lightheadedness  with hypotension: Yes Has patient had a PCN  reaction causing severe rash involving mucus membranes or skin necrosis: No Has patient had a PCN reaction that required hospitalization No Has patient had a PCN reaction occurring within the last 10 years: No If all of the above answers are "NO", then may proceed with Cephalosporin use. Has patient had a PCN reaction causing immediate rash, facial/tongue/throat swelling, SOB or lightheadedness with hypotension: Yes Has patient had a PCN reaction causing sever... (TRUNCATED)   Phenazopyridine Hives   Phenazopyridine Hcl Hives   Cephalexin Hives   Flonase [Fluticasone]     "It gave me ulcers in my nose"   Milk-Related Compounds Other (See Comments)    Doesn't agree with stomach.    Phenazopyridine Hcl Hives          Outpatient Encounter Medications as of 12/27/2021  Medication Sig Note   ACCU-CHEK GUIDE test strip TEST BLOOD SUGAR FOUR TIMES DAILY  AND AS NEEDED    Accu-Chek Softclix Lancets lancets TEST BLOOD SUGAR THREE TIMES DAILY AS DIRECTED    acetaminophen (TYLENOL) 325 MG tablet Take 650 mg by mouth every 6 (six) hours as needed.    albuterol (PROVENTIL) (2.5 MG/3ML) 0.083% nebulizer solution Take 3 mLs (2.5 mg total) by nebulization every 6 (six) hours as needed for wheezing or shortness of breath.    albuterol (VENTOLIN HFA) 108 (90 Base) MCG/ACT inhaler INHALE 1 TO 2 PUFFS EVERY 6 HOURS AS NEEDED FOR WHEEZING, SHORTNESS OF BREATH (Patient taking differently: Inhale 1-2 puffs into the lungs every 6 (six) hours as needed for wheezing or shortness of breath.)    Alcohol Swabs (DROPSAFE ALCOHOL PREP) 70 % PADS USE TO CLEAN FINGER PRIOR TO TESTING FOR BLOOD SUGAR  AS DIRECTED    alendronate (FOSAMAX) 70 MG tablet TAKE 1 TABLET EVERY 7 DAYS ON AN EMPTY STOMACH WITH A FULL GLASS OF WATER (Patient taking differently: Take 70 mg by mouth once a week.)    amLODipine (NORVASC) 10 MG tablet TAKE 1 TABLET EVERY DAY (Patient taking differently: Take 10 mg by mouth daily.)    ascorbic acid  (VITAMIN C) 500 MG tablet Take 500 mg by mouth daily.    aspirin EC 81 MG tablet Take 1 tablet (81 mg total) by mouth daily with breakfast.    Blood Glucose Calibration (ACCU-CHEK GUIDE CONTROL) LIQD USE AS DIRECTED    blood glucose meter kit and supplies Dispense based on patient and insurance preference. Use up to four times daily as directed. (FOR ICD-10 E10.9, E11.9).    buPROPion (WELLBUTRIN XL) 150 MG 24 hr tablet Take 1 tablet (150 mg total) by mouth every morning.    Calcium Carb-Cholecalciferol (CALTRATE 600+D3 SOFT) 600-20 MG-MCG CHEW Chew 1 tablet by mouth daily at 12 noon.    cetirizine (ZYRTEC) 10 MG tablet Take 1 tablet (10 mg total) by mouth daily.    Cholecalciferol (D3 PO) Take 1 tablet by mouth daily. 09/16/2021: Pt does not know how many units   Clobetasol Prop Emollient Base (CLOBETASOL PROPIONATE E) 0.05 % emollient cream Apply to affected area qd (Patient taking differently: Apply 1 Application topically daily.)    Continuous Blood Gluc Sensor (FREESTYLE LIBRE 14 DAY SENSOR) MISC 1 each by Does not apply route every 14 (fourteen) days. Change every 2 weeks    diclofenac Sodium (VOLTAREN) 1 % GEL APPLY 2 GRAMS TOPICALLY FOUR TIMES DAILY. (Patient taking differently: Apply 2 g topically 4 (four) times daily.)    Docusate Sodium (DSS)  100 MG CAPS Take 1 tablet by mouth in the morning and at bedtime.    DROPLET PEN NEEDLES 31G X 8 MM MISC USE FOR INJECTING INSULIN 4 TIMES DAILY.    ezetimibe (ZETIA) 10 MG tablet TAKE 1 TABLET EVERY DAY (Patient taking differently: Take 10 mg by mouth daily.)    furosemide (LASIX) 20 MG tablet Take 1 tablet (20 mg total) by mouth daily.    hydrocortisone (ANUSOL-HC) 2.5 % rectal cream Place 1 Application rectally 2 (two) times daily.    insulin aspart (NOVOLOG FLEXPEN) 100 UNIT/ML FlexPen Inject 5 Units into the skin 3 (three) times daily with meals. Give if eats 50% or more of meal. (Patient taking differently: Inject 14 Units into the skin 3 (three)  times daily with meals. Give if eats 50% or more of meal.)    insulin glargine, 2 Unit Dial, (TOUJEO MAX SOLOSTAR) 300 UNIT/ML Solostar Pen Inject 20-25 Units into the skin daily.    JARDIANCE 10 MG TABS tablet Take 10 mg by mouth daily.    lactobacillus acidophilus (BACID) TABS tablet Take 2 tablets by mouth 3 (three) times daily.    lamoTRIgine (LAMICTAL) 100 MG tablet Take 1 tablet (100 mg total) by mouth 2 (two) times daily.    levothyroxine (SYNTHROID) 50 MCG tablet TAKE 1 TABLET EVERY DAY FOR 6 DAYS PER WEEK, 1/2 TABLET 1 DAY PER WEEK    LORazepam (ATIVAN) 0.5 MG tablet Take 1 tablet (0.5 mg total) by mouth 2 (two) times daily.    losartan (COZAAR) 50 MG tablet TAKE 1 TABLET EVERY DAY    meclizine (ANTIVERT) 25 MG tablet Take 1 tablet (25 mg total) by mouth 3 (three) times daily as needed for dizziness.    metoprolol tartrate (LOPRESSOR) 50 MG tablet TAKE 1 TABLET TWICE DAILY (NEED MD APPOINTMENT) (Patient taking differently: Take 50 mg by mouth 2 (two) times daily.)    Misc. Devices (MATTRESS PAD) MISC FIRM MATTRESS PAD for hospital bed x 1    montelukast (SINGULAIR) 10 MG tablet TAKE 1 TABLET EVERY DAY (Patient taking differently: Take 10 mg by mouth at bedtime.)    Multiple Vitamins-Minerals (MULTIVITAMIN GUMMIES ADULT PO) Take 1 tablet by mouth daily.    MYRBETRIQ 25 MG TB24 tablet TAKE 1 TABLET EVERY DAY    nystatin (MYCOSTATIN/NYSTOP) powder APPLY TO AFFECTED AREA 4 TIMES DAILY. (Patient taking differently: Apply 1 Application topically in the morning, at noon, in the evening, and at bedtime.)    Omega-3 Fatty Acids (FISH OIL PO) Take 1 capsule by mouth daily.    ondansetron (ZOFRAN) 4 MG tablet Take 1 tablet (4 mg total) by mouth every 8 (eight) hours as needed for nausea or vomiting.    oxyCODONE-acetaminophen (PERCOCET) 10-325 MG tablet Take 1 tablet by mouth every 8 (eight) hours as needed for pain.    potassium chloride (KLOR-CON M) 10 MEQ tablet Take 1 tablet (10 mEq total) by  mouth 2 (two) times daily.    predniSONE (DELTASONE) 20 MG tablet Take 1 tablet (20 mg total) by mouth daily.    pregabalin (LYRICA) 75 MG capsule Take 1 capsule (75 mg total) by mouth daily.    RABEprazole (ACIPHEX) 20 MG tablet TAKE 1 TABLET TWICE DAILY    RESTASIS 0.05 % ophthalmic emulsion Place 2 drops into both eyes daily.    risperiDONE (RISPERDAL) 0.5 MG tablet Take 1 tablet (0.5 mg total) by mouth at bedtime.    rosuvastatin (CRESTOR) 5 MG tablet TAKE 1 TABLET AT  BEDTIME.    sertraline (ZOLOFT) 100 MG tablet Take 1 tablet (100 mg total) by mouth every morning.    STIOLTO RESPIMAT 2.5-2.5 MCG/ACT AERS INHALE 2 PUFFS INTO THE LUNGS DAILY.    telmisartan (MICARDIS) 20 MG tablet Take 10 mg by mouth daily.    tizanidine (ZANAFLEX) 2 MG capsule Take 1 capsule (2 mg total) by mouth 3 (three) times daily. Do not drink alcohol or drive while taking this medication.  May cause drowsiness.    traZODone (DESYREL) 150 MG tablet Take 1 tablet (150 mg total) by mouth at bedtime.    vitamin E 180 MG (400 UNITS) capsule Take 400 Units by mouth daily.    albuterol (PROVENTIL) (2.5 MG/3ML) 0.083% nebulizer solution Take 3 mLs (2.5 mg total) by nebulization every 6 (six) hours as needed for wheezing or shortness of breath. (Patient not taking: Reported on 12/27/2021)    doxycycline (VIBRAMYCIN) 100 MG capsule Take 1 capsule (100 mg total) by mouth 2 (two) times daily. (Patient not taking: Reported on 12/27/2021)    No facility-administered encounter medications on file as of 12/27/2021.    Patient Active Problem List   Diagnosis Date Noted   Bilateral leg edema 12/21/2021   Hypoglycemia 12/20/2021   Skin lesions, generalized 12/19/2021   Insomnia due to stress 12/19/2021   Major depressive disorder 12/11/2021   Anemia in chronic kidney disease (CKD) 10/12/2021   Subconjunctival hemorrhage of left eye 10/02/2021   Dehydration 09/25/2021   Encounter for support and coordination of transition of care  09/25/2021   Acute renal failure superimposed on stage 3a chronic kidney disease ([place]) 09/23/2021   AKI (acute kidney injury) ([place]) 09/16/2021   CHF (congestive heart failure) ([place]) 09/16/2021   Abnormal EKG 09/16/2021   Urinary frequency 09/01/2021   Anemia of chronic disease 08/10/2021   Generalized pain 08/04/2021   Sciatica, right side 07/19/2021   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/10/2021   Atherosclerosis of aorta ([place]) 07/10/2021   Generalized weakness 05/12/2021   Advanced care planning/counseling discussion 12/23/2020   Head trauma, initial encounter 12/20/2020   Trigger finger, right 12/12/2020   Confusion 11/24/2020   Blurry vision 11/18/2020   Cervical radiculopathy 07/09/2020   Closed fracture of lateral malleolus of right fibula 02/27/2020   Headache 02/19/2020   Tubular adenoma of colon 02/09/2019   Benign neoplasm of cerebral meninges ([place]) 11/12/2018   Benign meningioma of brain ([place]) 10/31/2018   Osteoarthritis of both hips 07/06/2018   Lipoma of extremity 12/31/2017   Impingement syndrome of left shoulder region 12/27/2017   Weakness generalized    Leg weakness, bilateral 10/28/2017   Lumbar spondylosis with myelopathy 10/12/2017   Numbness of hand 10/10/2017   Chronic neck pain 07/25/2017   Chronic pain syndrome 07/25/2017   At risk for cardiovascular event 40/98/1191   Acute metabolic encephalopathy 47/82/9562   Diabetic polyneuropathy associated with type 2 diabetes mellitus ([place]) 05/19/2016   Tobacco user 04/24/2016   Obesity (BMI 30.0-34.9) 04/24/2016   History of palpitations 08/09/2015   Labile hypertension 08/03/2015   Normal coronary arteries 08/03/2015   Dizziness 07/15/2015   Left-sided low back pain with left-sided sciatica 06/27/2015   Multinodular goiter 05/06/2015   Rectocele, female 04/27/2015   Anal sphincter incontinence 04/27/2015   Pelvic relaxation due to rectocele  03/30/2015   Hyperkalemia 02/22/2015   Pulmonary hypertension ([place]) 02/22/2015   Posterior chest pain 02/21/2015   Episodic cigarette smoking dependence 01/11/2015   Migraine  without aura and without status migrainosus, not intractable 07/02/2014   Flatulence 02/18/2014   Microcytic anemia 02/18/2014   Hip pain, right 12/17/2013   Acquired hypothyroidism 08/16/2013   Gastroparesis 04/28/2013   Nicotine dependence 03/09/2013   Seizure disorder ([place]) 01/19/2013   Displacement of cervical intervertebral disc without myelopathy 12/13/2012   Bursitis of shoulder 12/13/2012   Cervical disc disorder with radiculopathy of cervical region 10/31/2012   Solitary pulmonary nodule 08/19/2012   Anemia 07/05/2012   Hypersomnia disorder related to a known organic factor 06/11/2012   Pruritus 04/18/2012   Meningioma ([place]) 11/19/2011   Mononeuritis leg 10/25/2011   Carpal tunnel syndrome of right wrist 05/23/2011   Chronic pain of right hand 05/04/2011   Polypharmacy 04/28/2011   Mood disorder ([place]) 04/28/2011   Constipation 04/13/2011   Recurrent falls 12/12/2010   Oropharyngeal dysphagia 07/12/2010   Urinary incontinence 12/16/2009   HEARING LOSS 10/26/2009   DMII (diabetes mellitus, type 2) ([place]) 07/07/2009   Mixed hyperlipidemia 12/11/2008   IBS 12/11/2008   GERD 07/29/2008   MILK PRODUCTS ALLERGY 07/29/2008   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/03/2007   Essential hypertension 06/27/2007   Backache 06/19/2007   Osteoporosis 06/19/2007   Obstructive sleep apnea 06/19/2007   TRIGGER FINGER 04/18/2007   DIVERTICULOSIS, COLON 11/13/2006    Conditions to be addressed/monitored:CHF, HTN, COPD, and DMII  Care Plan : RN Care Manager Plan of Care  Updates made by Kassie Mends, RN since 12/27/2021 12:00 AM     Problem: No plan of care established for management of chronic disease state  (HTN, COPD, DM2, CHF, High risk for falls)   Priority: High      Long-Range Goal: Development of plan of care for chronic disease management  (HTN, COPD, DM2, CHF, High risk for falls)   Start Date: 08/23/2021  Expected End Date: 06/25/2022  Priority: High  Note:   Current Barriers:  Knowledge Deficits related to plan of care for management of HTN, COPD, and DMII  Chronic Disease Management support and education needs related to HTN, COPD, and DMII Patient reports she lives alone, has CAP aide 8 hours daily x 5 days per week, has church members that assist her frequently, pt does not drive, has necessary DME in the home and recently got a new a walker, pt denies any falls since last outreach, pt reports she has shower chair and aide assists as needed with bathing, cooking, cleaning, etc.  Pt reports she checks blood pressure on occasion with "readings usually good", checks CBG QID with fasting reading today 150, random ranges are usually low 100's, states uses [employer], follows special diet "sometimes", smokes 2 cigarettes per day and is still working on complete smoking cessation.  Pt reports depression being managed well, continues seeing LCSW Maurice Small for counseling and feels this has helped tremendously.  Patient reports >3 falls in the past year with most recent 11/09/21, went to ED, states " no injury"  pt states she "slipped instead of falling" Patient states her aide is out doing laundry, home health PT continues and this is helping, pt reports she has all medications and taking as prescribed. Patient states she continues weighing daily with weight today 159 pounds, has medication and taking as prescribed, pt continues checking blood pressure with "readings good" Patient reports "my CPAP machine is acting up and I need supplies", reports [employer] now wants pt to get CPAP supplies from another company and pt states she has not  heard from anyone and has tried to leave messages, pt seems confused about which company, per EMR notes, order for CPAP  supplies, etc sent to [facility] and also states face to face visit needed, patient states " no, this is not correct", RN care manager to collaborate with pulmonologist office [name].  RNCM Clinical Goal(s):  Patient will verbalize understanding of plan for management of HTN, COPD, and DMII as evidenced by patient report, review of EHR and  through collaboration with RN Care manager, provider, and care team.   Interventions: 1:1 collaboration with primary care provider regarding development and update of comprehensive plan of care as evidenced by provider attestation and co-signature Inter-disciplinary care team collaboration (see longitudinal plan of care) Evaluation of current treatment plan related to  self management and patient's adherence to plan as established by provider  Heart Failure Interventions:  (Status:  New goal. and Goal on track:  Yes.) Long Term Goal Provided education about placing scale on hard, flat surface Advised patient to weigh each morning after emptying bladder Discussed importance of daily weight and advised patient to weigh and record daily Reviewed role of diuretics in prevention of fluid overload and management of heart failure; Discussed the importance of keeping all appointments with provider  Reviewed Heart Failure action plan, importance of calling for 3 pound weight gain overnight or 5 pound weight gain in one week  COPD Interventions:  (Status:  New goal. and Goal on track:  Yes.) Long Term Goal Advised patient to self assesses COPD action plan zone and make appointment with provider if in the yellow zone for 48 hours without improvement Advised patient to engage in light exercise as tolerated 3-5 days a week to aid in the the management of COPD Provided education about and advised patient to utilize infection prevention strategies to reduce risk of respiratory infection Discussed the importance of adequate rest and management of fatigue with COPD Reviewed  COPD action plan Reviewed safety precautions and importance of asking for assistance as needed and always using walker In basket message sent to Dr. Janee Morn office- April Smith RN requesting information and clarification for patient to obtain CPAP/ supplies  Diabetes Interventions:  (Status:  New goal. and Goal on track:  Yes.) Long Term Goal Assessed patient's understanding of A1c goal: <7% Reviewed medications with patient and discussed importance of medication adherence Counseled on importance of regular laboratory monitoring as prescribed Discussed plans with patient for ongoing care management follow up and provided patient with direct contact information for care management team Review of patient status, including review of consultants reports, relevant laboratory and other test results, and medications completed Reinforced importance of continuing to  work with home health PT for strengthening Encouraged patient to complete exercises prescribed by PT Reinforced carbohydrate modified diet, importance of eating 3 meals per day Reviewed CBG log with patient  Lab Results  Component Value Date   HGBA1C 5.9 (A) 05/20/2021   Hypertension Interventions:  (Status:  New goal. and Goal on track:  Yes.) Long Term Goal Last practice recorded BP readings:  BP Readings from Last 3 Encounters:  08/16/21 137/62  08/10/21 (!) 164/68  07/19/21 116/62  Most recent eGFR/CrCl:  Lab Results  Component Value Date   EGFR 52 (L) 05/10/2021    No components found for: "CRCL"  Evaluation of current treatment plan related to hypertension self management and patient's adherence to plan as established by provider Advised patient, providing education and rationale, to monitor blood pressure daily and  record, calling PCP for findings outside established parameters Discussed complications of poorly controlled blood pressure such as heart disease, stroke, circulatory complications, vision complications, kidney  impairment, sexual dysfunction Reinforced importance of following low sodium diet and reading labels  Patient Goals/Self-Care Activities: Take medications as prescribed   Attend all scheduled provider appointments Call pharmacy for medication refills 3-7 days in advance of running out of medications Attend church or other social activities Perform all self care activities independently  Perform IADL's (shopping, preparing meals, housekeeping, managing finances) independently Call provider office for new concerns or questions  check blood sugar at prescribed times: 4 times daily check feet daily for cuts, sores or redness enter blood sugar readings and medication or insulin into daily log take the blood sugar log to all doctor visits take the blood sugar meter to all doctor visits trim toenails straight across fill half of plate with vegetables limit fast food meals to no more than 1 per week prepare main meal at home 3 to 5 days each week read food labels for fat, fiber, carbohydrates and portion size keep feet up while sitting wash and dry feet carefully every day wear comfortable, cotton socks eliminate smoking in my home identify and remove indoor air pollutants develop a rescue plan eliminate symptom triggers at home follow rescue plan if symptoms flare-up eat healthy/prescribed diet: carbohydrate modified, low sodium, heart healthy get at least 7 to 8 hours of sleep at night check blood pressure 3 times per week write blood pressure results in a log or diary take blood pressure log to all doctor appointments take medications for blood pressure exactly as prescribed report new symptoms to your doctor eat more whole grains, fruits and vegetables, lean meats and healthy fats Follow low sodium diet-limit fast food Follow CHF action plan- call your doctor for weight gain 3 pounds overnight or 5 pounds in one week Continue to weigh daily and record, keep up the good work Adhere  to COPD action plan Continue doing exercises prescribed by physical therapy fall prevention strategies: change position slowly, use assistive device such as walker or cane (per provider recommendations) when walking, keep walkways clear, have good lighting in room. It is important to contact your provider if you have any falls, maintain muscle strength/tone by exercise per provider recommendations. RN care manager is checking on CPAP/ supplies       Plan:Telephone follow up appointment with care management team member scheduled for:  01/03/22 at 13 am  Jacqlyn Larsen [facility], BSN RN Case Manager [facility] [phone]

## 2021-12-27 NOTE — Patient Instructions (Signed)
Visit Information  Thank you for taking time to visit with me today. Please don't hesitate to contact me if I can be of assistance to you before our next scheduled telephone appointment.  Following are the goals we discussed today:  Take medications as prescribed   Attend all scheduled provider appointments Call pharmacy for medication refills 3-7 days in advance of running out of medications Attend church or other social activities Perform all self care activities independently  Perform IADL's (shopping, preparing meals, housekeeping, managing finances) independently Call provider office for new concerns or questions  check blood sugar at prescribed times: 4 times daily check feet daily for cuts, sores or redness enter blood sugar readings and medication or insulin into daily log take the blood sugar log to all doctor visits take the blood sugar meter to all doctor visits trim toenails straight across fill half of plate with vegetables limit fast food meals to no more than 1 per week prepare main meal at home 3 to 5 days each week read food labels for fat, fiber, carbohydrates and portion size keep feet up while sitting wash and dry feet carefully every day wear comfortable, cotton socks eliminate smoking in my home identify and remove indoor air pollutants develop a rescue plan eliminate symptom triggers at home follow rescue plan if symptoms flare-up eat healthy/prescribed diet: carbohydrate modified, low sodium, heart healthy get at least 7 to 8 hours of sleep at night check blood pressure 3 times per week write blood pressure results in a log or diary take blood pressure log to all doctor appointments take medications for blood pressure exactly as prescribed report new symptoms to your doctor eat more whole grains, fruits and vegetables, lean meats and healthy fats Follow low sodium diet-limit fast food Follow CHF action plan- call your doctor for weight gain 3 pounds  overnight or 5 pounds in one week Continue to weigh daily and record, keep up the good work Adhere to COPD action plan Continue doing exercises prescribed by physical therapy fall prevention strategies: change position slowly, use assistive device such as walker or cane (per provider recommendations) when walking, keep walkways clear, have good lighting in room. It is important to contact your provider if you have any falls, maintain muscle strength/tone by exercise per provider recommendations. RN care manager is checking on CPAP/ supplies  Our next appointment is by telephone on 01/03/22 at 11 am  Please call the care guide team at [phone] if you need to cancel or reschedule your appointment.   If you are experiencing a Mental Health or [place] or need someone to talk to, please call the Suicide and Crisis Lifeline: 988 call the [place] National Suicide Prevention Lifeline: 3023281802 or TTY: (970)888-4225 TTY (570) 060-2836) to talk to a trained counselor call 1-800-273-TALK (toll free, 24 hour hotline) go to [facility] [address], [place] [phone]) call the [facility]: [phone] call 911   Patient verbalizes understanding of instructions and care plan provided today and agrees to view in [place]. Active MyChart status and patient understanding of how to access instructions and care plan via MyChart confirmed with patient.     Jacqlyn Larsen [facility], BSN [facility] RN Care Coordinator [phone]   Influenza (Flu) Vaccine (Inactivated or Recombinant): What You Need to Know 1. Why get vaccinated? Influenza vaccine can prevent influenza (flu). Flu is a contagious disease that spreads around the [place] every year, usually between October and May. Anyone can  get the flu, but it is more dangerous for some people. Infants and young children, people 70 years and older, pregnant people, and people with  certain health conditions or a weakened immune system are at greatest risk of flu complications. Pneumonia, bronchitis, sinus infections, and ear infections are examples of flu-related complications. If you have a medical condition, such as heart disease, cancer, or diabetes, flu can make it worse. Flu can cause fever and chills, sore throat, muscle aches, fatigue, cough, headache, and runny or stuffy nose. Some people may have vomiting and diarrhea, though this is more common in children than adults. In an average year, thousands of people in the [place] die from flu, and many more are hospitalized. Flu vaccine prevents millions of illnesses and flu-related visits to the doctor each year. 2. Influenza vaccines CDC recommends everyone 6 months and older get vaccinated every flu season. Children 6 months through 60 years of age may need 2 doses during a single flu season. Everyone else needs only 1 dose each flu season. It takes about 2 weeks for protection to develop after vaccination. There are many flu viruses, and they are always changing. Each year a new flu vaccine is made to protect against the influenza viruses believed to be likely to cause disease in the upcoming flu season. Even when the vaccine doesn't exactly match these viruses, it may still provide some protection. Influenza vaccine does not cause flu. Influenza vaccine may be given at the same time as other vaccines. 3. Talk with your health care provider Tell your vaccination provider if the person getting the vaccine: Has had an allergic reaction after a previous dose of influenza vaccine, or has any severe, life-threatening allergies Has ever had Guillain-Barr Syndrome (also called "GBS") In some cases, your health care provider may decide to postpone influenza vaccination until a future visit. Influenza vaccine can be administered at any time during pregnancy. People who are or will be pregnant during influenza season should  receive inactivated influenza vaccine. People with minor illnesses, such as a cold, may be vaccinated. People who are moderately or severely ill should usually wait until they recover before getting influenza vaccine. Your health care provider can give you more information. 4. Risks of a vaccine reaction Soreness, redness, and swelling where the shot is given, fever, muscle aches, and headache can happen after influenza vaccination. There may be a very small increased risk of Guillain-Barr Syndrome (GBS) after inactivated influenza vaccine (the flu shot). Young children who get the flu shot along with pneumococcal vaccine (PCV13) and/or DTaP vaccine at the same time might be slightly more likely to have a seizure caused by fever. Tell your health care provider if a child who is getting flu vaccine has ever had a seizure. People sometimes faint after medical procedures, including vaccination. Tell your provider if you feel dizzy or have vision changes or ringing in the ears. As with any medicine, there is a very remote chance of a vaccine causing a severe allergic reaction, other serious injury, or death. 5. What if there is a serious problem? An allergic reaction could occur after the vaccinated person leaves the clinic. If you see signs of a severe allergic reaction (hives, swelling of the face and throat, difficulty breathing, a fast heartbeat, dizziness, or weakness), call 9-1-1 and get the person to the nearest hospital. For other signs that concern you, call your health care provider. Adverse reactions should be reported to the Vaccine Adverse Event Reporting System (VAERS).  Your health care provider will usually file this report, or you can do it yourself. Visit the VAERS website at www.vaers.SamedayNews.es or call 817-322-6618. VAERS is only for reporting reactions, and VAERS staff members do not give medical advice. 6. The National Vaccine Injury Compensation Program The [employer] Vaccine Injury  Compensation Program (VICP) is a federal program that was created to compensate people who may have been injured by certain vaccines. Claims regarding alleged injury or death due to vaccination have a time limit for filing, which may be as short as two years. Visit the VICP website at GoldCloset.com.ee or call 248-366-5340 to learn about the program and about filing a claim. 7. How can I learn more? Ask your health care provider. Call your local or state health department. Visit the website of the Food and Drug Administration (FDA) for vaccine package inserts and additional information at TraderRating.uy. Contact the Centers for Disease Control and Prevention (CDC): Call 7242861708 (1-800-CDC-INFO) or Visit CDC's website at https://gibson.com/. Source: CDC Vaccine Information Statement Inactivated Influenza Vaccine (10/17/2019) This same material is available at http://www.wolf.info/ for no charge. This information is not intended to replace advice given to you by your health care provider. Make sure you discuss any questions you have with your health care provider. Document Revised: 01/26/2021 Document Reviewed: 11/18/2020 Elsevier Patient Education  [place].

## 2021-12-28 ENCOUNTER — Ambulatory Visit
Admission: RE | Admit: 2021-12-28 | Discharge: 2021-12-28 | Disposition: A | Discharge status: Home / Self Care | Payer: Medicare HMO | Source: Ambulatory Visit | Attending: Family Medicine | Admitting: Family Medicine

## 2021-12-28 DIAGNOSIS — Z1231 Encounter for screening mammogram for malignant neoplasm of breast: Secondary | ICD-10-CM

## 2021-12-28 NOTE — Telephone Encounter (Signed)
Please call to make an office visit to get a new CPAP. Thanks

## 2021-12-29 ENCOUNTER — Inpatient Hospital Stay: Payer: Medicare HMO

## 2021-12-29 ENCOUNTER — Other Ambulatory Visit: Payer: Self-pay | Admitting: Family Medicine

## 2021-12-29 ENCOUNTER — Ambulatory Visit: Payer: Medicare HMO | Admitting: Family Medicine

## 2021-12-29 VITALS — BP 127/57 | HR 66 | Temp 97.6°F | Resp 18

## 2021-12-29 DIAGNOSIS — I129 Hypertensive chronic kidney disease with stage 1 through stage 4 chronic kidney disease, or unspecified chronic kidney disease: Secondary | ICD-10-CM | POA: Diagnosis not present

## 2021-12-29 DIAGNOSIS — N1832 Chronic kidney disease, stage 3b: Secondary | ICD-10-CM | POA: Diagnosis not present

## 2021-12-29 DIAGNOSIS — D631 Anemia in chronic kidney disease: Secondary | ICD-10-CM

## 2021-12-29 DIAGNOSIS — D638 Anemia in other chronic diseases classified elsewhere: Secondary | ICD-10-CM

## 2021-12-29 DIAGNOSIS — Z789 Other specified health status: Secondary | ICD-10-CM

## 2021-12-29 DIAGNOSIS — Z79899 Other long term (current) drug therapy: Secondary | ICD-10-CM | POA: Diagnosis not present

## 2021-12-29 LAB — CBC
HCT: 35.5 % — ABNORMAL LOW (ref 36.0–46.0)
Hemoglobin: 10 g/dL — ABNORMAL LOW (ref 12.0–15.0)
MCH: 22.1 pg — ABNORMAL LOW (ref 26.0–34.0)
MCHC: 28.2 g/dL — ABNORMAL LOW (ref 30.0–36.0)
MCV: 78.5 fL — ABNORMAL LOW (ref 80.0–100.0)
Platelets: 276 10*3/uL (ref 150–400)
RBC: 4.52 MIL/uL (ref 3.87–5.11)
RDW: 14.7 % (ref 11.5–15.5)
WBC: 7.2 10*3/uL (ref 4.0–10.5)
nRBC: 0 % (ref 0.0–0.2)

## 2021-12-29 MED ORDER — EPOETIN ALFA 10000 UNIT/ML IJ SOLN
10000.0000 [IU] | Freq: Once | INTRAMUSCULAR | Status: AC
  Administered 2021-12-29: 10000 [IU] via SUBCUTANEOUS
  Filled 2021-12-29: qty 1

## 2021-12-29 NOTE — Patient Instructions (Signed)
[facility]  Discharge Instructions: Thank you for choosing [facility] to provide your oncology and hematology care.  If you have a lab appointment with the [facility], please come in thru the Main Entrance and check in at the main information desk.  Wear comfortable clothing and clothing appropriate for easy access to any Portacath or PICC line.   We strive to give you quality time with your provider. You may need to reschedule your appointment if you arrive late (15 or more minutes).  Arriving late affects you and other patients whose appointments are after yours.  Also, if you miss three or more appointments without notifying the office, you may be dismissed from the clinic at the provider's discretion.      For prescription refill requests, have your pharmacy contact our office and allow 72 hours for refills to be completed.    Today you received  your epogen injection.   To help prevent nausea and vomiting after your treatment, we encourage you to take your nausea medication as directed.  BELOW ARE SYMPTOMS THAT SHOULD BE REPORTED IMMEDIATELY: *FEVER GREATER THAN 100.4 F (38 C) OR HIGHER *CHILLS OR SWEATING *NAUSEA AND VOMITING THAT IS NOT CONTROLLED WITH YOUR NAUSEA MEDICATION *UNUSUAL SHORTNESS OF BREATH *UNUSUAL BRUISING OR BLEEDING *URINARY PROBLEMS (pain or burning when urinating, or frequent urination) *BOWEL PROBLEMS (unusual diarrhea, constipation, pain near the anus) TENDERNESS IN MOUTH AND THROAT WITH OR WITHOUT PRESENCE OF ULCERS (sore throat, sores in mouth, or a toothache) UNUSUAL RASH, SWELLING OR PAIN  UNUSUAL VAGINAL DISCHARGE OR ITCHING   Items with * indicate a potential emergency and should be followed up as soon as possible or go to the Emergency Department if any problems should occur.  Please show the CHEMOTHERAPY ALERT CARD or IMMUNOTHERAPY ALERT CARD at check-in to the Emergency Department and triage nurse.  Should  you have questions after your visit or need to cancel or reschedule your appointment, please contact [facility] [phone]  and follow the prompts.  Office hours are 8:00 a.m. to 4:30 p.m. Monday - Friday. Please note that voicemails left after 4:00 p.m. may not be returned until the following business day.  We are closed weekends and major holidays. You have access to a nurse at all times for urgent questions. Please call the main number to the clinic [phone] and follow the prompts.  For any non-urgent questions, you may also contact your provider using MyChart. We now offer e-Visits for anyone 68 and older to request care online for non-urgent symptoms. For details visit [email or web address].   Also download the MyChart app! Go to the app store, search "MyChart", open the app, select [facility], and log in with your MyChart username and password.  Masks are optional in the cancer centers. If you would like for your care team to wear a mask while they are taking care of you, please let them know. You may have one support person who is at least 70 years old accompany you for your appointments.

## 2021-12-29 NOTE — Progress Notes (Signed)
[id number]

## 2021-12-29 NOTE — Progress Notes (Signed)
Patient received Epogen injection today in clinic. See MAR for injection information. Patient remained stable throughout injection. Patient was discharged from clinic ambulatory with rollator and caretaker in stable conditon.

## 2021-12-30 ENCOUNTER — Telehealth: Payer: Self-pay | Admitting: Family Medicine

## 2021-12-30 NOTE — Telephone Encounter (Signed)
Pt returned call

## 2021-12-30 NOTE — Telephone Encounter (Signed)
Patient aware of lab results.

## 2022-01-03 ENCOUNTER — Ambulatory Visit: Payer: Medicare HMO | Admitting: *Deleted

## 2022-01-03 ENCOUNTER — Ambulatory Visit: Payer: Self-pay | Admitting: *Deleted

## 2022-01-03 ENCOUNTER — Telehealth: Payer: Self-pay

## 2022-01-03 ENCOUNTER — Encounter: Payer: Self-pay | Admitting: *Deleted

## 2022-01-03 DIAGNOSIS — J449 Chronic obstructive pulmonary disease, unspecified: Secondary | ICD-10-CM

## 2022-01-03 DIAGNOSIS — I10 Essential (primary) hypertension: Secondary | ICD-10-CM

## 2022-01-03 DIAGNOSIS — E1143 Type 2 diabetes mellitus with diabetic autonomic (poly)neuropathy: Secondary | ICD-10-CM

## 2022-01-03 NOTE — Telephone Encounter (Signed)
Pt's care manager contacted office to advise pt had questions/ concerns regarding [employer] system and would bring equipment in with her for upcoming appt. Unsure if Elenor Legato is a good Orthoptist for her.

## 2022-01-03 NOTE — Chronic Care Management (AMB) (Signed)
Chronic Care Management Provider Comprehensive Care Plan    Name: [name] MRN: [id number] DOB: 11-03-1950  Referral to Chronic Care Management (CCM) services was placed by Provider:  Tula Nakayama on Date: 12/29/21.  Interaction and coordination with outside resources, practitioners, and providers See CCM Referral  Chronic Condition 1: Hypertension Provider Assessment and Plan Essential hypertension Adequate though sub optimal control, no med change Needs to reduce sodium intake   Expected Outcome/Goals Addressed This Visit: (Provider CCM Goals)/Provider Assessment and Plan CCM (HYPERTENSION)  EXPECTED OUTCOME:  MONITOR,SELF- MANAGE AND REDUCE SYMPTOMS OF HYPERTENSION  Chronic Condition 2: Diabetes Provider Assessment and Plan DMII (diabetes mellitus, type 2) ([place]) [name] is reminded of the importance of commitment to daily physical activity for 30 minutes or more, as able and the need to limit carbohydrate intake to 30 to 60 grams per meal to help with blood sugar control.    The need to take medication as prescribed, test blood sugar as directed, and to call between visits if there is a concern that blood sugar is uncontrolled is also discussed.    [name] is reminded of the importance of daily foot exam, annual eye examination, and good blood sugar, blood pressure and cholesterol control. Managed by Endo,overcorrected, has had several episodes of    Expected Outcome/Goals Addressed This Visit: (Provider CCM Goals)/Provider Assessment and Plan  CCM (DIABETES) EXPECTED OUTCOME: MONITOR, SELF-MANAGE AND REDUCE SYMPTOMS OF DIABETES  Problem List Patient Active Problem List   Diagnosis Date Noted   Bilateral leg edema 12/21/2021   Hypoglycemia 12/20/2021   Skin lesions, generalized 12/19/2021   Insomnia due to stress 12/19/2021   Major depressive disorder 12/11/2021   Anemia in chronic kidney disease (CKD) 10/12/2021   Subconjunctival hemorrhage of left eye 10/02/2021    Dehydration 09/25/2021   Encounter for support and coordination of transition of care 09/25/2021   Acute renal failure superimposed on stage 3a chronic kidney disease ([place]) 09/23/2021   AKI (acute kidney injury) ([place]) 09/16/2021   CHF (congestive heart failure) ([place]) 09/16/2021   Abnormal EKG 09/16/2021   Urinary frequency 09/01/2021   Anemia of chronic disease 08/10/2021   Generalized pain 08/04/2021   Sciatica, right side 07/19/2021   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[place]) 07/10/2021   Atherosclerosis of aorta ([place]) 07/10/2021   Generalized weakness 05/12/2021   Advanced care planning/counseling discussion 12/23/2020   Head trauma, initial encounter 12/20/2020   Trigger finger, right 12/12/2020   Confusion 11/24/2020   Blurry vision 11/18/2020   Cervical radiculopathy 07/09/2020   Closed fracture of lateral malleolus of right fibula 02/27/2020   Headache 02/19/2020   Tubular adenoma of colon 02/09/2019   Benign neoplasm of cerebral meninges ([place]) 11/12/2018   Benign meningioma of brain ([place]) 10/31/2018   Osteoarthritis of both hips 07/06/2018   Lipoma of extremity 12/31/2017   Impingement syndrome of left shoulder region 12/27/2017   Weakness generalized    Leg weakness, bilateral 10/28/2017   Lumbar spondylosis with myelopathy 10/12/2017   Numbness of hand 10/10/2017   Chronic neck pain 07/25/2017   Chronic pain syndrome 07/25/2017   At risk for cardiovascular event 21/19/4174   Acute metabolic encephalopathy 10/24/4816   Diabetic polyneuropathy associated with type 2 diabetes mellitus ([place]) 05/19/2016   Tobacco user 04/24/2016   Obesity (BMI 30.0-34.9) 04/24/2016   History of palpitations 08/09/2015   Labile hypertension 08/03/2015   Normal coronary arteries 08/03/2015   Dizziness 07/15/2015   Left-sided  low back pain with left-sided sciatica 06/27/2015   Multinodular goiter 05/06/2015   Rectocele, female  04/27/2015   Anal sphincter incontinence 04/27/2015   Pelvic relaxation due to rectocele 03/30/2015   Hyperkalemia 02/22/2015   Pulmonary hypertension ([place]) 02/22/2015   Posterior chest pain 02/21/2015   Episodic cigarette smoking dependence 01/11/2015   Migraine without aura and without status migrainosus, not intractable 07/02/2014   Flatulence 02/18/2014   Microcytic anemia 02/18/2014   Hip pain, right 12/17/2013   Acquired hypothyroidism 08/16/2013   Gastroparesis 04/28/2013   Nicotine dependence 03/09/2013   Seizure disorder ([place]) 01/19/2013   Displacement of cervical intervertebral disc without myelopathy 12/13/2012   Bursitis of shoulder 12/13/2012   Cervical disc disorder with radiculopathy of cervical region 10/31/2012   Solitary pulmonary nodule 08/19/2012   Anemia 07/05/2012   Hypersomnia disorder related to a known organic factor 06/11/2012   Pruritus 04/18/2012   Meningioma ([place]) 11/19/2011   Mononeuritis leg 10/25/2011   Carpal tunnel syndrome of right wrist 05/23/2011   Chronic pain of right hand 05/04/2011   Polypharmacy 04/28/2011   Mood disorder ([place]) 04/28/2011   Constipation 04/13/2011   Recurrent falls 12/12/2010   Oropharyngeal dysphagia 07/12/2010   Urinary incontinence 12/16/2009   HEARING LOSS 10/26/2009   DMII (diabetes mellitus, type 2) ([place]) 07/07/2009   Mixed hyperlipidemia 12/11/2008   IBS 12/11/2008   GERD 07/29/2008   MILK PRODUCTS ALLERGY 07/29/2008   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    albuterol (PROVENTIL) (2.5 MG/3ML) 0.083% nebulizer solution Take 3 mLs (2.5 mg total) by nebulization every 6 (six) hours as needed for wheezing or shortness of breath.    albuterol (PROVENTIL) (2.5 MG/3ML) 0.083% nebulizer solution Take 3 mLs (2.5 mg total) by nebulization every 6 (six) hours as needed for wheezing or shortness of breath.    albuterol (VENTOLIN HFA) 108 (90 Base) MCG/ACT inhaler INHALE 1 TO 2 PUFFS EVERY 6 HOURS AS NEEDED FOR WHEEZING, SHORTNESS OF BREATH (Patient taking differently: Inhale 1-2 puffs into the lungs every 6 (six) hours as needed for wheezing or shortness of breath.)    Alcohol Swabs (DROPSAFE ALCOHOL PREP) 70 % PADS USE TO CLEAN FINGER PRIOR TO TESTING FOR BLOOD SUGAR  AS DIRECTED    alendronate (FOSAMAX) 70 MG tablet TAKE 1 TABLET EVERY 7 DAYS ON AN EMPTY STOMACH WITH A FULL GLASS OF WATER (Patient taking differently: Take 70 mg by mouth once a week.)    amLODipine (NORVASC) 10 MG tablet TAKE 1 TABLET EVERY DAY (Patient taking differently: Take 10 mg by mouth daily.)    ascorbic acid (VITAMIN C) 500 MG tablet Take 500 mg by mouth daily.    aspirin EC 81 MG tablet Take 1 tablet (81 mg total) by mouth daily with breakfast.    Blood Glucose Calibration (ACCU-CHEK GUIDE CONTROL) LIQD USE AS DIRECTED    blood glucose meter kit and supplies Dispense based on patient and insurance  preference. Use up to four times daily as directed. (FOR ICD-10 E10.9, E11.9).    buPROPion (WELLBUTRIN XL) 150 MG 24 hr tablet Take 1 tablet (150 mg total) by mouth every morning.    Calcium Carb-Cholecalciferol (CALTRATE 600+D3 SOFT) 600-20 MG-MCG CHEW Chew 1 tablet by mouth daily at 12 noon.    cetirizine (ZYRTEC) 10 MG tablet Take 1 tablet (10 mg total) by mouth daily.    Cholecalciferol (D3 PO) Take 1 tablet by mouth daily. 09/16/2021: Pt does not know how many units    Clobetasol Prop Emollient Base (CLOBETASOL PROPIONATE E) 0.05 % emollient cream Apply to affected area qd (Patient taking differently: Apply 1 Application topically daily.)    diclofenac Sodium (VOLTAREN) 1 % GEL APPLY 2 GRAMS TOPICALLY FOUR TIMES DAILY. (Patient taking differently: Apply 2 g topically 4 (four) times daily.)    Docusate Sodium (DSS) 100 MG CAPS Take 1 tablet by mouth in the morning and at bedtime.    DROPLET PEN NEEDLES 31G X 8 MM MISC USE FOR INJECTING INSULIN 4 TIMES DAILY.    ezetimibe (ZETIA) 10 MG tablet TAKE 1 TABLET EVERY DAY (Patient taking differently: Take 10 mg by mouth daily.)    furosemide (LASIX) 20 MG tablet Take 1 tablet (20 mg total) by mouth daily.    hydrocortisone (ANUSOL-HC) 2.5 % rectal cream Place 1 Application rectally 2 (two) times daily.    insulin aspart (NOVOLOG FLEXPEN) 100 UNIT/ML FlexPen Inject 5 Units into the skin 3 (three) times daily with meals. Give if eats 50% or more of meal. (Patient taking differently: Inject 14 Units into the skin 3 (three) times daily with meals. Give if eats 50% or more of meal.)    insulin glargine, 2 Unit Dial, (TOUJEO MAX SOLOSTAR) 300 UNIT/ML Solostar Pen Inject 20-25 Units into the skin daily.    JARDIANCE 10 MG TABS tablet Take 10 mg by mouth daily.    lactobacillus acidophilus (BACID) TABS tablet Take 2 tablets by mouth 3 (three) times daily.    lamoTRIgine (LAMICTAL) 100 MG tablet Take 1 tablet (100 mg total) by mouth 2 (two) times daily.    levothyroxine (SYNTHROID) 50 MCG tablet TAKE 1 TABLET EVERY DAY FOR 6 DAYS PER WEEK, 1/2 TABLET 1 DAY PER WEEK    LORazepam (ATIVAN) 0.5 MG tablet Take 1 tablet (0.5 mg total) by mouth 2 (two) times daily.    losartan (COZAAR) 50 MG tablet TAKE 1 TABLET EVERY DAY    meclizine (ANTIVERT) 25 MG tablet Take 1 tablet (25 mg total) by mouth 3 (three) times daily as needed for dizziness.    metoprolol tartrate (LOPRESSOR) 50 MG tablet TAKE 1 TABLET TWICE DAILY (NEED MD APPOINTMENT)  (Patient taking differently: Take 50 mg by mouth 2 (two) times daily.)    Misc. Devices (MATTRESS PAD) MISC FIRM MATTRESS PAD for hospital bed x 1    montelukast (SINGULAIR) 10 MG tablet TAKE 1 TABLET EVERY DAY (Patient taking differently: Take 10 mg by mouth at bedtime.)    Multiple Vitamins-Minerals (MULTIVITAMIN GUMMIES ADULT PO) Take 1 tablet by mouth daily.    MYRBETRIQ 25 MG TB24 tablet TAKE 1 TABLET EVERY DAY    nystatin (MYCOSTATIN/NYSTOP) powder APPLY TO AFFECTED AREA 4 TIMES DAILY. (Patient taking differently: Apply 1 Application topically in the morning, at noon, in the evening, and at bedtime.)    Omega-3 Fatty Acids (FISH OIL PO) Take 1 capsule by mouth daily.    ondansetron (ZOFRAN) 4 MG tablet Take 1 tablet (  4 mg total) by mouth every 8 (eight) hours as needed for nausea or vomiting.    oxyCODONE-acetaminophen (PERCOCET) 10-325 MG tablet Take 1 tablet by mouth every 8 (eight) hours as needed for pain.    potassium chloride (KLOR-CON M) 10 MEQ tablet Take 1 tablet (10 mEq total) by mouth 2 (two) times daily.    potassium chloride (KLOR-CON) 10 MEQ tablet Take 10 mEq by mouth 2 (two) times daily.    predniSONE (DELTASONE) 20 MG tablet Take 1 tablet (20 mg total) by mouth daily.    pregabalin (LYRICA) 75 MG capsule Take 1 capsule (75 mg total) by mouth daily.    RABEprazole (ACIPHEX) 20 MG tablet TAKE 1 TABLET TWICE DAILY    RESTASIS 0.05 % ophthalmic emulsion Place 2 drops into both eyes daily.    risperiDONE (RISPERDAL) 0.5 MG tablet Take 1 tablet (0.5 mg total) by mouth at bedtime.    rosuvastatin (CRESTOR) 5 MG tablet TAKE 1 TABLET AT BEDTIME.    sertraline (ZOLOFT) 100 MG tablet Take 1 tablet (100 mg total) by mouth every morning.    STIOLTO RESPIMAT 2.5-2.5 MCG/ACT AERS INHALE 2 PUFFS INTO THE LUNGS DAILY.    telmisartan (MICARDIS) 20 MG tablet Take 10 mg by mouth daily.    tizanidine (ZANAFLEX) 2 MG capsule Take 1 capsule (2 mg total) by mouth 3 (three) times daily. Do not  drink alcohol or drive while taking this medication.  May cause drowsiness.    traZODone (DESYREL) 150 MG tablet Take 1 tablet (150 mg total) by mouth at bedtime.    vitamin E 180 MG (400 UNITS) capsule Take 400 Units by mouth daily.    Continuous Blood Gluc Sensor (FREESTYLE LIBRE 14 DAY SENSOR) MISC 1 each by Does not apply route every 14 (fourteen) days. Change every 2 weeks (Patient not taking: Reported on 01/03/2022)    doxycycline (VIBRAMYCIN) 100 MG capsule Take 1 capsule (100 mg total) by mouth 2 (two) times daily.    No facility-administered encounter medications on file as of 01/03/2022.    Cognitive Assessment Identity Confirmed: _0  Name; _1  DOB Cognitive Status: _2  Normal _3  Abnormal  Functional Status Survey:    Caregiver Assessment  Current Living Arrangement: apartment Living arrangements - the patient lives alone. Caregiver: CAP aide M-F 8 hours per day, church members assist Caregiver Relation: Non-Family Status: Stable, available

## 2022-01-03 NOTE — Chronic Care Management (AMB) (Signed)
   01/03/2022  [name] 06-13-1950 [id number]  ERROR

## 2022-01-03 NOTE — Chronic Care Management (AMB) (Signed)
   01/03/2022  Berkley Wrightsman [place] 03/20/1950 [id number]   Care plan updated and resolved, patient now has new order and note in new CCM platform.  Jacqlyn Larsen [facility], BSN RN Case Manager [facility] [phone]

## 2022-01-03 NOTE — Chronic Care Management (AMB) (Signed)
CCM RN Visit Note   01/03/22 Name: [name] MRN: [id number]      DOB: February 14, 1951  Subjective: [name] is a 71 y.o. year old female who is a primary care patient of [name], MD. The patient was referred to the Chronic Care Management team for assistance with care management needs subsequent to provider initiation of CCM services and plan of care.      Today's Visit: Engaged with patient by telephone for initial visit.   SDOH Interventions Today    Flowsheet Row Most Recent Value  SDOH Interventions   Food Insecurity Interventions Intervention Not Indicated  Housing Interventions Intervention Not Indicated  Transportation Interventions Intervention Not Indicated  Utilities Interventions Intervention Not Indicated  Financial Strain Interventions Intervention Not Indicated  Physical Activity Interventions Intervention Not Indicated  Stress Interventions Intervention Not Indicated  Social Connections Interventions Intervention Not Indicated        Goals Addressed             This Visit's Progress    CCM (DIABETES) EXPECTED OUTCOME: MONITOR, SELF-MANAGE AND REDUCE SYMPTOMS OF DIABETES       Current Barriers:  Knowledge Deficits related to Diabetes Chronic Disease Management support and education needs related to management of Diabetes, diet Patient reports she lives alone, has CAP aide 8 hours per day/ 5 days per week and she provides transportation as well as church members, patient reports she checks CBG QID with fasting ranges 70-150 and has Freestyle Elenor Legato but has been unable to use, stating " I don't understanding how this works"  pt states she can take [employer] with her at upcoming appointment in November to see endocrinologist, pt states " this may not be the thing for me"  Patient reports she has had no recent falls since August 2023. Patient states she has been working with home health PT and "this has helped", has needed DME - WC, cane, walker,  hospital bed, pt reports she has prefilled med box and takes medications as prescribed.  Planned Interventions: Provided education to patient about basic DM disease process; Reviewed medications with patient and discussed importance of medication adherence;        Reviewed prescribed diet with patient carbohydrate modified; Discussed plans with patient for ongoing care management follow up and provided patient with direct contact information for care management team;      Review of patient status, including review of consultants reports, relevant laboratory and other test results, and medications completed;       Screening for signs and symptoms of depression related to chronic disease state;        Assessed social determinant of health barriers;        Ask patient to take Freestyle Libre to her endocrinologist appointment and pt reports they helped her before with education Telephone call to Dr. Renne Crigler reporting pt I having difficulty with using the [facility] and feels this may not work for her  Symptom Management: Take medications as prescribed   Attend all scheduled provider appointments Call pharmacy for medication refills 3-7 days in advance of running out of medications Attend church or other social activities Call provider office for new concerns or questions  check blood sugar at prescribed times: 4 times daily check feet daily for cuts, sores or redness enter blood sugar readings and medication or insulin into daily log take the blood sugar log to all doctor visits take the blood sugar meter to all doctor visits trim  toenails straight across read food labels for fat, fiber, carbohydrates and portion size wash and dry feet carefully every day wear comfortable, cotton socks Take Freestyle Libre with you when you see [name] on 01/23/22 Keep follow up appointment 01/16/22 with pulmonologist so you can get CPAP supplies, etc  Follow Up Plan: Telephone follow up  appointment with care management team member scheduled for: 02/09/22 at 91 am       CCM (HYPERTENSION)  EXPECTED OUTCOME:  MONITOR,SELF- MANAGE AND REDUCE SYMPTOMS OF HYPERTENSION       Current Barriers:  Knowledge Deficits related to Hypertension Chronic Disease Management support and education needs related to management of Hypertension Patient reports her aide monitors blood pressure 5 days per week, states readings "vary from day to day but usually pretty good" Patient reports she does smoke a "few cigarettes" per day but declines smoking cessation, states she continues to weigh daily for CHF  Planned Interventions: Evaluation of current treatment plan related to hypertension self management and patient's adherence to plan as established by provider;   Reviewed medications with patient and discussed importance of compliance;  Discussed plans with patient for ongoing care management follow up and provided patient with direct contact information for care management team; Discussed complications of poorly controlled blood pressure such as heart disease, stroke, circulatory complications, vision complications, kidney impairment, sexual dysfunction;  Screening for signs and symptoms of depression related to chronic disease state;  Assessed social determinant of health barriers;   Symptom Management: Take medications as prescribed   Attend all scheduled provider appointments Call pharmacy for medication refills 3-7 days in advance of running out of medications Attend church or other social activities Call provider office for new concerns or questions  check blood pressure 3 times per week choose a place to take my blood pressure (home, clinic or office, retail store) write blood pressure results in a log or diary take blood pressure log to all doctor appointments take medications for blood pressure exactly as prescribed eat more whole grains, fruits and vegetables, lean meats and healthy  fats  Follow Up Plan: Telephone follow up appointment with care management team member scheduled for:  02/09/22 at 1045 am          Plan:Telephone follow up appointment with care management team member scheduled for:  02/09/22 at [place] am  Jacqlyn Larsen [facility], BSN RN Case Manager [facility] [phone]

## 2022-01-03 NOTE — Patient Instructions (Signed)
Please call the care guide team at [phone] if you need to cancel or reschedule your appointment.   If you are experiencing a Mental Health or [place] or need someone to talk to, please call the Suicide and Crisis Lifeline: 988 call the [place] National Suicide Prevention Lifeline: 562-651-4382 or TTY: 813-489-0966 TTY 325-778-7553) to talk to a trained counselor call 1-800-273-TALK (toll free, 24 hour hotline) go to [facility] [address], [place] [phone]) call the [place]: [phone] call 911   Following is a copy of your full provider care plan:   Goals Addressed             This Visit's Progress    CCM (DIABETES) EXPECTED OUTCOME: MONITOR, SELF-MANAGE AND REDUCE SYMPTOMS OF DIABETES       Current Barriers:  Knowledge Deficits related to Diabetes Chronic Disease Management support and education needs related to management of Diabetes, diet Patient reports she lives alone, has CAP aide 8 hours per day/ 5 days per week and she provides transportation as well as church members, patient reports she checks CBG QID with fasting ranges 70-150 and has Freestyle [place] but has been unable to use, stating " I don't understanding how this works"  pt states she can take [employer] with her at upcoming appointment in November to see endocrinologist, pt states " this may not be the thing for me"  Patient reports she has had no recent falls since August 2023. Patient states she has been working with home health PT and "this has helped", has needed DME - WC, cane, walker, hospital bed, pt reports she has prefilled med box and takes medications as prescribed.  Planned Interventions: Provided education to patient about basic DM disease process; Reviewed medications with patient and discussed importance of medication adherence;        Reviewed prescribed diet with patient carbohydrate modified; Discussed  plans with patient for ongoing care management follow up and provided patient with direct contact information for care management team;      Review of patient status, including review of consultants reports, relevant laboratory and other test results, and medications completed;       Screening for signs and symptoms of depression related to chronic disease state;        Assessed social determinant of health barriers;        Ask patient to take Freestyle [place] to her endocrinologist appointment and pt reports they helped her before with education Telephone call to Dr. Renne Crigler reporting pt I having difficulty with using the [facility] and feels this may not work for her  Symptom Management: Take medications as prescribed   Attend all scheduled provider appointments Call pharmacy for medication refills 3-7 days in advance of running out of medications Attend church or other social activities Call provider office for new concerns or questions  check blood sugar at prescribed times: 4 times daily check feet daily for cuts, sores or redness enter blood sugar readings and medication or insulin into daily log take the blood sugar log to all doctor visits take the blood sugar meter to all doctor visits trim toenails straight across read food labels for fat, fiber, carbohydrates and portion size wash and dry feet carefully every day wear comfortable, cotton socks Take Freestyle [place]re with you when you see [name] on 01/23/22 Keep follow up appointment 01/16/22 with pulmonologist so you can get CPAP supplies, etc  Follow Up Plan: Telephone follow  up appointment with care management team member scheduled for: 02/09/22 at 21 am       CCM (HYPERTENSION)  EXPECTED OUTCOME:  MONITOR,SELF- MANAGE AND REDUCE SYMPTOMS OF HYPERTENSION       Current Barriers:  Knowledge Deficits related to Hypertension Chronic Disease Management support and education needs related to management of  Hypertension Patient reports her aide monitors blood pressure 5 days per week, states readings "vary from day to day but usually pretty good" Patient reports she does smoke a "few cigarettes" per day but declines smoking cessation, states she continues to weigh daily for CHF  Planned Interventions: Evaluation of current treatment plan related to hypertension self management and patient's adherence to plan as established by provider;   Reviewed medications with patient and discussed importance of compliance;  Discussed plans with patient for ongoing care management follow up and provided patient with direct contact information for care management team; Discussed complications of poorly controlled blood pressure such as heart disease, stroke, circulatory complications, vision complications, kidney impairment, sexual dysfunction;  Screening for signs and symptoms of depression related to chronic disease state;  Assessed social determinant of health barriers;   Symptom Management: Take medications as prescribed   Attend all scheduled provider appointments Call pharmacy for medication refills 3-7 days in advance of running out of medications Attend church or other social activities Call provider office for new concerns or questions  check blood pressure 3 times per week choose a place to take my blood pressure (home, clinic or office, retail store) write blood pressure results in a log or diary take blood pressure log to all doctor appointments take medications for blood pressure exactly as prescribed eat more whole grains, fruits and vegetables, lean meats and healthy fats  Follow Up Plan: Telephone follow up appointment with care management team member scheduled for:  02/09/22 at 1045 am          Patient verbalizes understanding of instructions and care plan provided today and agrees to view in [place]. Active MyChart status and patient understanding of how to access instructions and  care plan via MyChart confirmed with patient.     Telephone follow up appointment with care management team member scheduled for:  02/09/22 at 1045 am

## 2022-01-04 ENCOUNTER — Other Ambulatory Visit: Payer: Self-pay | Admitting: Internal Medicine

## 2022-01-04 DIAGNOSIS — E1122 Type 2 diabetes mellitus with diabetic chronic kidney disease: Secondary | ICD-10-CM | POA: Diagnosis not present

## 2022-01-04 DIAGNOSIS — Z79899 Other long term (current) drug therapy: Secondary | ICD-10-CM | POA: Diagnosis not present

## 2022-01-04 DIAGNOSIS — R809 Proteinuria, unspecified: Secondary | ICD-10-CM | POA: Diagnosis not present

## 2022-01-04 DIAGNOSIS — N189 Chronic kidney disease, unspecified: Secondary | ICD-10-CM | POA: Diagnosis not present

## 2022-01-04 DIAGNOSIS — E870 Hyperosmolality and hypernatremia: Secondary | ICD-10-CM | POA: Diagnosis not present

## 2022-01-04 DIAGNOSIS — I129 Hypertensive chronic kidney disease with stage 1 through stage 4 chronic kidney disease, or unspecified chronic kidney disease: Secondary | ICD-10-CM | POA: Diagnosis not present

## 2022-01-04 DIAGNOSIS — E1129 Type 2 diabetes mellitus with other diabetic kidney complication: Secondary | ICD-10-CM | POA: Diagnosis not present

## 2022-01-09 ENCOUNTER — Telehealth (HOSPITAL_COMMUNITY): Payer: Medicare HMO | Admitting: Psychiatry

## 2022-01-10 ENCOUNTER — Ambulatory Visit (INDEPENDENT_AMBULATORY_CARE_PROVIDER_SITE_OTHER): Payer: Medicare HMO | Admitting: Psychiatry

## 2022-01-10 ENCOUNTER — Inpatient Hospital Stay: Payer: Medicare HMO

## 2022-01-10 DIAGNOSIS — F3170 Bipolar disorder, currently in remission, most recent episode unspecified: Secondary | ICD-10-CM | POA: Diagnosis not present

## 2022-01-10 DIAGNOSIS — Z79899 Other long term (current) drug therapy: Secondary | ICD-10-CM | POA: Diagnosis not present

## 2022-01-10 DIAGNOSIS — I10 Essential (primary) hypertension: Secondary | ICD-10-CM | POA: Diagnosis not present

## 2022-01-10 DIAGNOSIS — N1832 Chronic kidney disease, stage 3b: Secondary | ICD-10-CM | POA: Diagnosis not present

## 2022-01-10 DIAGNOSIS — J449 Chronic obstructive pulmonary disease, unspecified: Secondary | ICD-10-CM | POA: Diagnosis not present

## 2022-01-10 DIAGNOSIS — I509 Heart failure, unspecified: Secondary | ICD-10-CM | POA: Diagnosis not present

## 2022-01-10 DIAGNOSIS — I110 Hypertensive heart disease with heart failure: Secondary | ICD-10-CM | POA: Diagnosis not present

## 2022-01-10 DIAGNOSIS — I129 Hypertensive chronic kidney disease with stage 1 through stage 4 chronic kidney disease, or unspecified chronic kidney disease: Secondary | ICD-10-CM | POA: Diagnosis not present

## 2022-01-10 DIAGNOSIS — F331 Major depressive disorder, recurrent, moderate: Secondary | ICD-10-CM | POA: Diagnosis not present

## 2022-01-10 DIAGNOSIS — E1159 Type 2 diabetes mellitus with other circulatory complications: Secondary | ICD-10-CM

## 2022-01-10 DIAGNOSIS — D631 Anemia in chronic kidney disease: Secondary | ICD-10-CM | POA: Diagnosis not present

## 2022-01-10 LAB — CBC
HCT: 36 % (ref 36.0–46.0)
Hemoglobin: 10.3 g/dL — ABNORMAL LOW (ref 12.0–15.0)
MCH: 22.4 pg — ABNORMAL LOW (ref 26.0–34.0)
MCHC: 28.6 g/dL — ABNORMAL LOW (ref 30.0–36.0)
MCV: 78.4 fL — ABNORMAL LOW (ref 80.0–100.0)
Platelets: 221 10*3/uL (ref 150–400)
RBC: 4.59 MIL/uL (ref 3.87–5.11)
RDW: 15 % (ref 11.5–15.5)
WBC: 6 10*3/uL (ref 4.0–10.5)
nRBC: 0 % (ref 0.0–0.2)

## 2022-01-10 NOTE — Progress Notes (Unsigned)
Virtual Visit via Telephone Note  I connected with [name] on 01/10/22 at 4:10 PM EDT  by telephone and verified that I am speaking with the correct person using two identifiers.  Location: Patient: Home Provider: [place] office    I discussed the limitations, risks, security and privacy concerns of performing an evaluation and management service by telephone and the availability of in person appointments. I also discussed with the patient that there may be a patient responsible charge related to this service. The patient expressed understanding and agreed to proceed.   I provided 45 minutes of non-face-to-face time during this encounter.   [name], LCSW             THERAPIST PROGRESS NOTE    Session Time: Tuesday 01/10/2022  4:10 PM - 4:55 PM   Participation Level: Active  Behavioral Response: less depressed, talkative, alert tearful  Type of Therapy: Individual Therapy  Treatment Goals addressed: Patient will score less than 10 on the patient health questionnaire  Progress on goals: Progressing  Interventions: CBT and Supportive             Summary: [name] is a 71 y.o. female who presents with  long standing history of recurrent periods  of depression beginning when she was 52 and her favorite uncle died. Patient reports multiple psychiatric hospitlaizations due to depression and suicidal ideations with the last one occuring in 1997. Patient has participated in outpatient psychotherapy and medication management intermittently since age 64.  She currently is seeing psychiatrist Dr. Harrington Challenger . Prior to this, she was seen at [facility]. Patient also has had ECT at [facility]. Symptoms have worsened in recent months due to family stress and have  included depressed mood, anxiety, excessive worry, and tearfulness.            Patient's last contact was by virtual visit via telephone about 4 weeks ago.  Patient reports continued symptoms of depression  but decreased intensity and frequency.  However, she has experienced crying spells as she has experienced more pain as well as concerns about her physical health.  She continues to use distracting activities like listening to music and trying to read or listen to scriptures.  She continues to have contact with family members as well as fellow church members including her pastor.  She reports worry about her granddaughter as patient has seen her twice since last session.  She also has tried to implement efforts to nurture relationship with grandson by calling/sending text.  Suicidal/Homicidal: Nowithout intent/plan      Therapist Response: Reviewed symptoms, discussed stressors, facilitated expression of thoughts and feelings, validated feelings, and reinforced patient's use of healthy coping strategies, develop plan with patient to resume use daily gratitude list to help cope with negative thoughts  Diagnosis: Axis I: MDD, Recurrent, moderate    Axis II: Deferred Collaboration of Care: Psychiatrist AEB patient working with psychiatrist Dr. Harrington Challenger  Patient/Guardian was advised Release of Information must be obtained prior to any record release in order to collaborate their care with an outside provider. Patient/Guardian was advised if they have not already done so to contact the registration department to sign all necessary forms in order for [name] to release information regarding their care.   Consent: Patient/Guardian gives verbal consent for treatment and assignment of benefits for services provided during this visit. Patient/Guardian expressed understanding and agreed to proceed.    Media Pizzini E Cecila Satcher, LCSW

## 2022-01-11 DIAGNOSIS — R296 Repeated falls: Secondary | ICD-10-CM | POA: Diagnosis not present

## 2022-01-11 DIAGNOSIS — N1832 Chronic kidney disease, stage 3b: Secondary | ICD-10-CM | POA: Diagnosis not present

## 2022-01-11 DIAGNOSIS — I10 Essential (primary) hypertension: Secondary | ICD-10-CM | POA: Diagnosis not present

## 2022-01-11 DIAGNOSIS — Z79899 Other long term (current) drug therapy: Secondary | ICD-10-CM | POA: Diagnosis not present

## 2022-01-11 DIAGNOSIS — M5412 Radiculopathy, cervical region: Secondary | ICD-10-CM | POA: Diagnosis not present

## 2022-01-11 DIAGNOSIS — M25512 Pain in left shoulder: Secondary | ICD-10-CM | POA: Diagnosis not present

## 2022-01-11 DIAGNOSIS — I129 Hypertensive chronic kidney disease with stage 1 through stage 4 chronic kidney disease, or unspecified chronic kidney disease: Secondary | ICD-10-CM | POA: Diagnosis not present

## 2022-01-11 DIAGNOSIS — J449 Chronic obstructive pulmonary disease, unspecified: Secondary | ICD-10-CM | POA: Diagnosis not present

## 2022-01-11 DIAGNOSIS — M810 Age-related osteoporosis without current pathological fracture: Secondary | ICD-10-CM | POA: Diagnosis not present

## 2022-01-11 DIAGNOSIS — D631 Anemia in chronic kidney disease: Secondary | ICD-10-CM | POA: Diagnosis not present

## 2022-01-11 DIAGNOSIS — Z87891 Personal history of nicotine dependence: Secondary | ICD-10-CM | POA: Diagnosis not present

## 2022-01-12 ENCOUNTER — Inpatient Hospital Stay: Payer: Medicare HMO

## 2022-01-12 ENCOUNTER — Inpatient Hospital Stay: Payer: Medicare HMO | Attending: Hematology

## 2022-01-12 VITALS — BP 137/67 | Temp 98.9°F | Resp 18

## 2022-01-12 DIAGNOSIS — N1832 Chronic kidney disease, stage 3b: Secondary | ICD-10-CM | POA: Insufficient documentation

## 2022-01-12 DIAGNOSIS — D638 Anemia in other chronic diseases classified elsewhere: Secondary | ICD-10-CM

## 2022-01-12 DIAGNOSIS — D631 Anemia in chronic kidney disease: Secondary | ICD-10-CM | POA: Insufficient documentation

## 2022-01-12 DIAGNOSIS — M810 Age-related osteoporosis without current pathological fracture: Secondary | ICD-10-CM | POA: Diagnosis not present

## 2022-01-12 DIAGNOSIS — Z79899 Other long term (current) drug therapy: Secondary | ICD-10-CM | POA: Insufficient documentation

## 2022-01-12 DIAGNOSIS — Z87891 Personal history of nicotine dependence: Secondary | ICD-10-CM | POA: Insufficient documentation

## 2022-01-12 DIAGNOSIS — I129 Hypertensive chronic kidney disease with stage 1 through stage 4 chronic kidney disease, or unspecified chronic kidney disease: Secondary | ICD-10-CM | POA: Insufficient documentation

## 2022-01-12 MED ORDER — EPOETIN ALFA 10000 UNIT/ML IJ SOLN
10000.0000 [IU] | Freq: Once | INTRAMUSCULAR | Status: AC
  Administered 2022-01-12: 10000 [IU] via SUBCUTANEOUS
  Filled 2022-01-12: qty 1

## 2022-01-12 NOTE — Progress Notes (Signed)
Patient presents today for Retacrit injection per providers order.  Labs 01/11/22, Hgb 10.3. Stable during administration without incident; injection site WNL; see MAR for injection details.  Patient tolerated procedure well and without incident.  No questions or complaints noted at this time.

## 2022-01-12 NOTE — Patient Instructions (Signed)
[facility]  Discharge Instructions: Thank you for choosing [facility] to provide your oncology and hematology care.  If you have a lab appointment with the [facility], please come in thru the Main Entrance and check in at the main information desk.  Wear comfortable clothing and clothing appropriate for easy access to any Portacath or PICC line.   We strive to give you quality time with your provider. You may need to reschedule your appointment if you arrive late (15 or more minutes).  Arriving late affects you and other patients whose appointments are after yours.  Also, if you miss three or more appointments without notifying the office, you may be dismissed from the clinic at the provider's discretion.      For prescription refill requests, have your pharmacy contact our office and allow 72 hours for refills to be completed.    Today you received the following chemotherapy and/or immunotherapy agents Epogen      To help prevent nausea and vomiting after your treatment, we encourage you to take your nausea medication as directed.  BELOW ARE SYMPTOMS THAT SHOULD BE REPORTED IMMEDIATELY: *FEVER GREATER THAN 100.4 F (38 C) OR HIGHER *CHILLS OR SWEATING *NAUSEA AND VOMITING THAT IS NOT CONTROLLED WITH YOUR NAUSEA MEDICATION *UNUSUAL SHORTNESS OF BREATH *UNUSUAL BRUISING OR BLEEDING *URINARY PROBLEMS (pain or burning when urinating, or frequent urination) *BOWEL PROBLEMS (unusual diarrhea, constipation, pain near the anus) TENDERNESS IN MOUTH AND THROAT WITH OR WITHOUT PRESENCE OF ULCERS (sore throat, sores in mouth, or a toothache) UNUSUAL RASH, SWELLING OR PAIN  UNUSUAL VAGINAL DISCHARGE OR ITCHING   Items with * indicate a potential emergency and should be followed up as soon as possible or go to the Emergency Department if any problems should occur.  Please show the CHEMOTHERAPY ALERT CARD or IMMUNOTHERAPY ALERT CARD at check-in to the Emergency  Department and triage nurse.  Should you have questions after your visit or need to cancel or reschedule your appointment, please contact [facility] [phone]  and follow the prompts.  Office hours are 8:00 a.m. to 4:30 p.m. Monday - Friday. Please note that voicemails left after 4:00 p.m. may not be returned until the following business day.  We are closed weekends and major holidays. You have access to a nurse at all times for urgent questions. Please call the main number to the clinic [phone] and follow the prompts.  For any non-urgent questions, you may also contact your provider using MyChart. We now offer e-Visits for anyone 18 and older to request care online for non-urgent symptoms. For details visit [email or web address].   Also download the MyChart app! Go to the app store, search "MyChart", open the app, select [facility], and log in with your MyChart username and password.  Masks are optional in the cancer centers. If you would like for your care team to wear a mask while they are taking care of you, please let them know. You may have one support person who is at least 70 years old accompany you for your appointments.

## 2022-01-16 ENCOUNTER — Ambulatory Visit: Payer: Medicare HMO | Admitting: Pulmonary Disease

## 2022-01-16 ENCOUNTER — Encounter: Payer: Self-pay | Admitting: Primary Care

## 2022-01-16 ENCOUNTER — Ambulatory Visit (INDEPENDENT_AMBULATORY_CARE_PROVIDER_SITE_OTHER): Payer: Medicare HMO | Admitting: Primary Care

## 2022-01-16 VITALS — BP 120/62 | HR 61 | Temp 98.4°F | Ht 62.0 in | Wt 170.6 lb

## 2022-01-16 DIAGNOSIS — G4733 Obstructive sleep apnea (adult) (pediatric): Secondary | ICD-10-CM | POA: Diagnosis not present

## 2022-01-16 DIAGNOSIS — J310 Chronic rhinitis: Secondary | ICD-10-CM | POA: Insufficient documentation

## 2022-01-16 MED ORDER — IPRATROPIUM BROMIDE 0.03 % NA SOLN: 2.0000 | Freq: Two times a day (BID) | NASAL | 12 refills | Status: DC

## 2022-01-16 NOTE — Assessment & Plan Note (Signed)
-   Patient complains of PND symptoms/sinus congestion. She did not tolerate flonase. Recommend trying Atrovent nasal spray twice daily

## 2022-01-16 NOTE — Progress Notes (Signed)
_0  ID: [name], female    DOB: 02/15/1951, 71 y.o.   MRN: [id number]  Chief Complaint  Patient presents with   Follow-up    Referring provider: [name], MD  HPI: 71 year old female. PMH significant for HTN, pulmonary hypertension, CHF, OSA. NPSG on 06/29/2013, AHI 8.4/hr. Non-diagnostic MSLT in 2015.   01/16/2022 Patient presents today for annual follow-up OSA.  Patient has a history of obstructive sleep apnea. She had a sleep study in April 2015 that showed mild OSA, AHI 8.4/hour. She has not been wearing CPAP for the last 5 months. Tells me she was never shown how to use it. She needs new machine and supplies. She reports symptoms of daytime sleepiness/fatigue, snoring and morning headaches. She does not drive. Typical bedtime 1am. She wakes up 2-3 times a night. She starts her day at 8am. She does not nap. She falls asleep easily when inactive.    Allergies  Allergen Reactions   Iron Nausea And Vomiting    And itching And itching   Milk ([place]) Rash    Doesn't agree with stomach.  Doesn't agree with stomach.  Doesn't agree with stomach.    Penicillins Hives    Has patient had a PCN reaction causing immediate rash, facial/tongue/throat swelling, SOB or lightheadedness with hypotension: Yes Has patient had a PCN reaction causing severe rash involving mucus membranes or skin necrosis: No Has patient had a PCN reaction that required hospitalization No Has patient had a PCN reaction occurring within the last 10 years: No If all of the above answers are "NO", then may proceed with Cephalosporin use.  Other reaction(s): Other (see comments) Has patient had a PCN reaction causing immediate rash, facial/tongue/throat swelling, SOB or lightheadedness with hypotension: Yes Has patient had a PCN reaction causing severe rash involving mucus membranes or skin necrosis: No Has patient had a PCN reaction that required hospitalization No Has patient had a PCN reaction  occurring within the last 10 years: No If all of the above answers are "NO", then may proceed with Cephalosporin use. Has patient had a PCN reaction causing immediate rash, facial/tongue/throat swelling, SOB or lightheadedness with hypotension: Yes Has patient had a PCN reaction causing severe rash involving mucus membranes or skin necrosis: No Has patient had a PCN reaction that required hospitalization No Has patient had a PCN reaction occurring within the last 10 years: No If all of the above answers are "NO", then may proceed with Cephalosporin use. Has patient had a PCN reaction causing immediate rash, facial/tongue/throat swelling, SOB or lightheadedness with hypotension: Yes Has patient had a PCN reaction causing severe rash involving mucus membranes or skin necrosis: No Has patient had a PCN reaction that required hospitalization No Has patient had a PCN reaction occurring within the last 10 years: No If all of the above answers are "NO", then may proceed with Cephalosporin use. Has patient had a PCN reaction causing immediate rash, facial/tongue/throat swelling, SOB or lightheadedness with hypotension: Yes Has patient had a PCN reaction causing sever... (TRUNCATED)   Phenazopyridine Hives   Phenazopyridine Hcl Hives   Cephalexin Hives   Flonase [Fluticasone]     "It gave me ulcers in my nose"   Milk-Related Compounds Other (See Comments)    Doesn't agree with stomach.    Phenazopyridine Hcl Hives          Immunization History  Administered Date(s) Administered   Fluad Quad(high Dose 65+) 01/01/2019, 12/11/2019, 11/10/2020, 12/23/2021   H1N1 01/09/2008  Influenza Split 12/12/2010, 11/16/2011   Influenza Whole 11/23/2006, 12/10/2007, 12/11/2008, 11/16/2009   Influenza,inj,Quad PF,6+ Mos 11/28/2012, 11/20/2013, 11/10/2014, 12/06/2015, 11/21/2016, 11/20/2017   Influenza,inj,quad, With Preservative 12/11/2016, 01/07/2018   Moderna Sars-Covid-2 Vaccination 04/27/2019, 05/25/2019,  02/16/2020   Pneumococcal Conjugate-13 09/16/2013   Pneumococcal Polysaccharide-23 08/03/2003, 12/11/2008, 08/22/2010, 08/31/2015   Td 09/23/2003   Td (Adult), 2 Lf Tetanus Toxid, Preservative Free 09/23/2003   Tdap 12/29/2011   Zoster Recombinat (Shingrix) 02/12/2019, 07/03/2019    Past Medical History:  Diagnosis Date   Allergy    Anemia    Anemia in chronic kidney disease (CKD) 08/10/2021   Anemia in chronic kidney disease (CKD) 10/12/2021   Anxiety    takes Ativan daily   Arthritis    Assistance needed for mobility    Bipolar disorder ([place])    takes Risperdal nightly   Blood transfusion    Brain tumor ([place])    Cancer ([place])    In her gum   Carpal tunnel syndrome of right wrist 05/23/2011   Cervical disc disorder with radiculopathy of cervical region 10/31/2012   Chronic back pain    Chronic idiopathic constipation    Chronic neck and back pain    Colon polyps    COPD (chronic obstructive pulmonary disease) with chronic bronchitis 09/16/2013   Office Spirometry 10/30/2013-submaximal effort based on appearance of loop and curve. Numbers would fit with severe restriction but her physiologic capability may be better than this. FVC 0.91/44%, and 10.74/45%, FEV1/FVC 0.81, FEF 25-75% 1.43/69%     Diabetes mellitus    Type II   Diverticulosis    TCS 9/08 by Dr. Delfin Edis for diarrhea . Bx for micro scopic colitis negative.    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   Metabolic encephalopathy 81/82/9937    Migraines    chronic headaches   Mononeuritis lower limb    Narcolepsy    Osteoporosis    Pancreatitis 2006   due to Depakote with normal EUS    Paralysis ([facility])    Schatzki's ring    non critical / EGD with ED 8/2011with RMR   Seizures ([place])    takes Lamictal daily.Last seizure 3 yrs ago   Sleep apnea    on CPAP   Small bowel obstruction ([facility])    Stroke ([facility])    left sided weakness, speech changes   Tubular adenoma of colon     Tobacco History: Social History   Tobacco Use  Smoking Status Former   Packs/day: 0.25   Years: 30.00   Total pack years: 7.50   Types: Cigarettes   Quit date: 07/05/2021   Years since quitting: 0.5   Passive exposure: Past  Smokeless Tobacco Never  Tobacco Comments   3-4 a day MRC 05/17/2021   Counseling given: Not Answered Tobacco comments: 3-4 a day MRC 05/17/2021   Outpatient Medications Prior to Visit  Medication Sig Dispense Refill   ACCU-CHEK GUIDE test  strip TEST BLOOD SUGAR FOUR TIMES DAILY  AND AS NEEDED 450 strip 2   Accu-Chek Softclix Lancets lancets TEST BLOOD SUGAR THREE TIMES DAILY AS DIRECTED 300 each 2   acetaminophen (TYLENOL) 325 MG tablet Take 650 mg by mouth every 6 (six) hours as needed.     albuterol (PROVENTIL) (2.5 MG/3ML) 0.083% nebulizer solution Take 3 mLs (2.5 mg total) by nebulization every 6 (six) hours as needed for wheezing or shortness of breath. 75 mL 12   albuterol (PROVENTIL) (2.5 MG/3ML) 0.083% nebulizer solution Take 3 mLs (2.5 mg total) by nebulization every 6 (six) hours as needed for wheezing or shortness of breath. 150 mL 2   albuterol (VENTOLIN HFA) 108 (90 Base) MCG/ACT inhaler INHALE 1 TO 2 PUFFS EVERY 6 HOURS AS NEEDED FOR WHEEZING, SHORTNESS OF BREATH (Patient taking differently: Inhale 1-2 puffs into the lungs every 6 (six) hours as needed for wheezing or shortness of breath.) 3 each 3   Alcohol Swabs (DROPSAFE ALCOHOL PREP) 70 % PADS USE TO CLEAN FINGER PRIOR TO TESTING FOR BLOOD SUGAR  AS DIRECTED  300 each 1   alendronate (FOSAMAX) 70 MG tablet TAKE 1 TABLET EVERY 7 DAYS ON AN EMPTY STOMACH WITH A FULL GLASS OF WATER (Patient taking differently: Take 70 mg by mouth once a week.) 12 tablet 3   amLODipine (NORVASC) 10 MG tablet TAKE 1 TABLET EVERY DAY (Patient taking differently: Take 10 mg by mouth daily.) 90 tablet 1   ascorbic acid (VITAMIN C) 500 MG tablet Take 500 mg by mouth daily.     aspirin EC 81 MG tablet Take 1 tablet (81 mg total) by mouth daily with breakfast. 120 tablet 2   Blood Glucose Calibration (ACCU-CHEK GUIDE CONTROL) LIQD USE AS DIRECTED 1 each 0   blood glucose meter kit and supplies Dispense based on patient and insurance preference. Use up to four times daily as directed. (FOR ICD-10 E10.9, E11.9). 1 each 0   buPROPion (WELLBUTRIN XL) 150 MG 24 hr tablet Take 1 tablet (150 mg total) by mouth every morning. 90 tablet 2   Calcium Carb-Cholecalciferol (CALTRATE 600+D3 SOFT) 600-20 MG-MCG CHEW Chew 1 tablet by mouth daily at 12 noon.     cetirizine (ZYRTEC) 10 MG tablet Take 1 tablet (10 mg total) by mouth daily. 30 tablet 1   Cholecalciferol (D3 PO) Take 1 tablet by mouth daily.     Clobetasol Prop Emollient Base (CLOBETASOL PROPIONATE E) 0.05 % emollient cream Apply to affected area qd (Patient taking differently: Apply 1 Application topically daily.) 60 g 6   Continuous Blood Gluc Sensor (FREESTYLE LIBRE 14 DAY SENSOR) MISC 1 each by Does not apply route every 14 (fourteen) days. Change every 2 weeks 2 each 11   diclofenac Sodium (VOLTAREN) 1 % GEL APPLY 2 GRAMS TOPICALLY FOUR TIMES DAILY. (Patient taking differently: Apply 2 g topically 4 (four) times daily.) 700 g 0   Docusate Sodium (DSS) 100 MG CAPS Take 1 tablet by mouth in the morning and at bedtime.     doxycycline (VIBRAMYCIN) 100 MG capsule Take 1 capsule (100 mg total) by mouth 2 (two) times daily. 20 capsule 0   DROPLET PEN NEEDLES 31G X 8 MM MISC USE FOR INJECTING INSULIN 4 TIMES DAILY. 400 each 0   ezetimibe  (ZETIA) 10 MG tablet TAKE 1 TABLET EVERY DAY (Patient taking differently: Take 10 mg by mouth daily.) 90 tablet 1   furosemide (LASIX) 20 MG tablet Take 1 tablet (20 mg total)  by mouth daily. 30 tablet 1   hydrocortisone (ANUSOL-HC) 2.5 % rectal cream Place 1 Application rectally 2 (two) times daily. 60 g 0   insulin aspart (NOVOLOG FLEXPEN) 100 UNIT/ML FlexPen Inject 5 Units into the skin 3 (three) times daily with meals. Give if eats 50% or more of meal. (Patient taking differently: Inject 14 Units into the skin 3 (three) times daily with meals. Give if eats 50% or more of meal.) 60 mL 0   insulin glargine, 2 Unit Dial, (TOUJEO MAX SOLOSTAR) 300 UNIT/ML Solostar Pen Inject 20-25 Units into the skin daily. 12 mL 0   JARDIANCE 10 MG TABS tablet Take 10 mg by mouth daily.     lactobacillus acidophilus (BACID) TABS tablet Take 2 tablets by mouth 3 (three) times daily.     lamoTRIgine (LAMICTAL) 100 MG tablet Take 1 tablet (100 mg total) by mouth 2 (two) times daily. 180 tablet 2   levothyroxine (SYNTHROID) 50 MCG tablet TAKE 1 TABLET EVERY DAY FOR 6 DAYS PER WEEK, 1/2 TABLET 1 DAY PER WEEK 90 tablet 1   LORazepam (ATIVAN) 0.5 MG tablet Take 1 tablet (0.5 mg total) by mouth 2 (two) times daily. 60 tablet 2   LORazepam (ATIVAN) 1 MG tablet TAKE 1 TABLET BY MOUTH ONCE AS NEEDED FOR UP TO 1 DOSE FOR ANXIETY. 3 tablet 0   losartan (COZAAR) 50 MG tablet TAKE 1 TABLET EVERY DAY 90 tablet 1   meclizine (ANTIVERT) 25 MG tablet Take 1 tablet (25 mg total) by mouth 3 (three) times daily as needed for dizziness. 30 tablet 0   metoprolol tartrate (LOPRESSOR) 50 MG tablet TAKE 1 TABLET TWICE DAILY (NEED MD APPOINTMENT) (Patient taking differently: Take 50 mg by mouth 2 (two) times daily.) 180 tablet 3   Misc. Devices (MATTRESS PAD) MISC FIRM MATTRESS PAD for hospital bed x 1 1 each 0   montelukast (SINGULAIR) 10 MG tablet TAKE 1 TABLET EVERY DAY (Patient taking differently: Take 10 mg by mouth at bedtime.) 90 tablet  1   Multiple Vitamins-Minerals (MULTIVITAMIN GUMMIES ADULT PO) Take 1 tablet by mouth daily.     MYRBETRIQ 25 MG TB24 tablet TAKE 1 TABLET EVERY DAY 90 tablet 1   nystatin (MYCOSTATIN/NYSTOP) powder APPLY TO AFFECTED AREA 4 TIMES DAILY. (Patient taking differently: Apply 1 Application topically in the morning, at noon, in the evening, and at bedtime.) 30 g 2   Omega-3 Fatty Acids (FISH OIL PO) Take 1 capsule by mouth daily.     ondansetron (ZOFRAN) 4 MG tablet Take 1 tablet (4 mg total) by mouth every 8 (eight) hours as needed for nausea or vomiting. 20 tablet 2   oxyCODONE-acetaminophen (PERCOCET) 10-325 MG tablet Take 1 tablet by mouth every 8 (eight) hours as needed for pain.     potassium chloride (KLOR-CON M) 10 MEQ tablet Take 1 tablet (10 mEq total) by mouth 2 (two) times daily. 60 tablet 1   potassium chloride (KLOR-CON) 10 MEQ tablet Take 10 mEq by mouth 2 (two) times daily.     predniSONE (DELTASONE) 20 MG tablet Take 1 tablet (20 mg total) by mouth daily. 4 tablet 0   pregabalin (LYRICA) 75 MG capsule Take 1 capsule (75 mg total) by mouth daily.     RABEprazole (ACIPHEX) 20 MG tablet TAKE 1 TABLET TWICE DAILY 180 tablet 2   RESTASIS 0.05 % ophthalmic emulsion Place 2 drops into both eyes daily.     risperiDONE (RISPERDAL) 0.5 MG tablet Take 1 tablet (0.5  mg total) by mouth at bedtime. 90 tablet 2   rosuvastatin (CRESTOR) 5 MG tablet TAKE 1 TABLET AT BEDTIME. 90 tablet 1   sertraline (ZOLOFT) 100 MG tablet Take 1 tablet (100 mg total) by mouth every morning. 90 tablet 2   STIOLTO RESPIMAT 2.5-2.5 MCG/ACT AERS INHALE 2 PUFFS INTO THE LUNGS DAILY. 12 g 1   telmisartan (MICARDIS) 20 MG tablet Take 10 mg by mouth daily.     tizanidine (ZANAFLEX) 2 MG capsule Take 1 capsule (2 mg total) by mouth 3 (three) times daily. Do not drink alcohol or drive while taking this medication.  May cause drowsiness. 15 capsule 0   traZODone (DESYREL) 150 MG tablet Take 1 tablet (150 mg total) by mouth at  bedtime. 90 tablet 2   vitamin E 180 MG (400 UNITS) capsule Take 400 Units by mouth daily.     No facility-administered medications prior to visit.   Review of Systems  Review of Systems  Constitutional:  Positive for fatigue.  HENT: Negative.    Respiratory: Negative.      Physical Exam  BP 120/62   Pulse 61   Temp 98.4 F (36.9 C) (Oral)   Ht _0  (1.575 m)   Wt 170 lb 9.6 oz (77.4 kg)   SpO2 94%   BMI 31.20 kg/m  Physical Exam Constitutional:      Appearance: Normal appearance.  HENT:     Head: Normocephalic and atraumatic.  Cardiovascular:     Rate and Rhythm: Normal rate and regular rhythm.  Pulmonary:     Effort: Pulmonary effort is normal.     Breath sounds: Normal breath sounds.  Neurological:     General: No focal deficit present.     Mental Status: She is alert and oriented to person, place, and time. Mental status is at baseline.  Psychiatric:        Mood and Affect: Mood normal.        Behavior: Behavior normal.        Thought Content: Thought content normal.        Judgment: Judgment normal.      Lab Results:  CBC    Component Value Date/Time   WBC 6.0 01/10/2022 1255   RBC 4.59 01/10/2022 1255   HGB 10.3 (L) 01/10/2022 1255   HGB 10.3 (L) 11/20/2021 1118   HCT 36.0 01/10/2022 1255   HCT 35.4 11/20/2021 1118   PLT 221 01/10/2022 1255   PLT 305 11/20/2021 1118   MCV 78.4 (L) 01/10/2022 1255   MCV 78 (L) 11/20/2021 1118   MCH 22.4 (L) 01/10/2022 1255   MCHC 28.6 (L) 01/10/2022 1255   RDW 15.0 01/10/2022 1255   RDW 13.9 11/20/2021 1118   LYMPHSABS 2.1 11/20/2021 1118   MONOABS 0.7 10/12/2021 0932   EOSABS 0.4 11/20/2021 1118   BASOSABS 0.0 11/20/2021 1118    BMET    Component Value Date/Time   NA 144 12/23/2021 1143   K 4.4 12/23/2021 1143   CL 106 12/23/2021 1143   CO2 24 12/23/2021 1143   GLUCOSE 143 (H) 12/23/2021 1143   GLUCOSE 155 (H) 11/09/2021 1136   BUN 30 (H) 12/23/2021 1143   CREATININE 1.35 (H) 12/23/2021 1143    CREATININE 1.24 (H) 09/30/2018 1143   CALCIUM 8.9 12/23/2021 1143   GFRNONAA >60 11/09/2021 1136   GFRNONAA 45 (L) 09/30/2018 1143   GFRAA 72 12/11/2019 1224   GFRAA 52 (L) 09/30/2018 1143    BNP  Component Value Date/Time   BNP 55.0 05/19/2017 1023    ProBNP    Component Value Date/Time   PROBNP 91.5 02/03/2008 0426    Imaging: MM 3D SCREEN BREAST BILATERAL  Result Date: 12/29/2021 CLINICAL DATA:  Screening. EXAM: DIGITAL SCREENING BILATERAL MAMMOGRAM WITH TOMOSYNTHESIS AND CAD TECHNIQUE: Bilateral screening digital craniocaudal and mediolateral oblique mammograms were obtained. Bilateral screening digital breast tomosynthesis was performed. The images were evaluated with computer-aided detection. COMPARISON:  Previous exam(s). ACR Breast Density Category a: The breast tissue is almost entirely fatty. FINDINGS: There are no findings suspicious for malignancy. IMPRESSION: No mammographic evidence of malignancy. A result letter of this screening mammogram will be mailed directly to the patient. RECOMMENDATION: Screening mammogram in one year. (Code:[id number]-[id number]) BI-RADS CATEGORY  1: Negative. Electronically Signed   By: Margarette Canada M.D.   On: 12/29/2021 13:24     Assessment & Plan:   Obstructive sleep apnea - Hx mild OSA. NPSG April 19th, 2015>> AHI 8.4/hour. Weight 160lbs. MSLT non-diagnostic.  - She has not worn CPAP machine in >5 months. She is having symptoms of snoring, daytime sleepiness and morning headaches.  - She is not a candidate for oral appliance d/t dentures. Inspire right now is not an options since her sleep apnea is mild. She needs updated sleep studies, recommend getting split night study in lab. - FU in 6 weeks to review sleep study results and CPAP treatment in more detail   Rhinitis - Patient complains of PND symptoms/sinus congestion. She did not tolerate flonase. Recommend trying Atrovent nasal spray twice daily   Martyn Ehrich, NP 01/16/2022

## 2022-01-16 NOTE — Assessment & Plan Note (Addendum)
-   Hx mild OSA. NPSG April 19th, 2015>> AHI 8.4/hour. Weight 160lbs. MSLT non-diagnostic.  - She has not worn CPAP machine in >5 months. She is having symptoms of snoring, daytime sleepiness and morning headaches.  - She is not a candidate for oral appliance d/t dentures. Inspire right now is not an options since her sleep apnea is mild. She needs updated sleep studies, recommend getting split night study in lab. - FU in 6 weeks to review sleep study results and CPAP treatment in more detail

## 2022-01-16 NOTE — Patient Instructions (Addendum)
Recommendations: - Start Atrovent nasal spray twice daily for nasal drainage/congestion  - Focus on side sleeping position, work on weight loss - Do not drive if experiencing excessive daytime sleepiness   Orders: - Split night sleep study (ordered)   RX: - Atrovent nasal spray   Follow-up: - 6-8 weeks with [name] (if nothing open, can schedule with [facility])

## 2022-01-17 ENCOUNTER — Ambulatory Visit: Payer: Medicare HMO | Admitting: Family Medicine

## 2022-01-18 ENCOUNTER — Telehealth: Payer: Self-pay | Admitting: Family Medicine

## 2022-01-18 NOTE — Telephone Encounter (Signed)
FYI

## 2022-01-18 NOTE — Telephone Encounter (Signed)
FYI patient called to let Dr Moshe Cipro know her weight before 162 lb now 167 lb with that fluid pill that was given for her feet swelling.

## 2022-01-19 ENCOUNTER — Other Ambulatory Visit: Payer: Self-pay | Admitting: Family Medicine

## 2022-01-19 DIAGNOSIS — M79644 Pain in right finger(s): Secondary | ICD-10-CM | POA: Diagnosis not present

## 2022-01-19 NOTE — Telephone Encounter (Signed)
noted 

## 2022-01-23 ENCOUNTER — Ambulatory Visit (INDEPENDENT_AMBULATORY_CARE_PROVIDER_SITE_OTHER): Payer: Medicare HMO | Admitting: Internal Medicine

## 2022-01-23 ENCOUNTER — Encounter: Payer: Self-pay | Admitting: Internal Medicine

## 2022-01-23 VITALS — BP 132/82 | HR 59 | Ht 62.0 in | Wt 165.4 lb

## 2022-01-23 DIAGNOSIS — Z794 Long term (current) use of insulin: Secondary | ICD-10-CM | POA: Diagnosis not present

## 2022-01-23 DIAGNOSIS — E1165 Type 2 diabetes mellitus with hyperglycemia: Secondary | ICD-10-CM

## 2022-01-23 DIAGNOSIS — E042 Nontoxic multinodular goiter: Secondary | ICD-10-CM

## 2022-01-23 DIAGNOSIS — E039 Hypothyroidism, unspecified: Secondary | ICD-10-CM | POA: Diagnosis not present

## 2022-01-23 NOTE — Patient Instructions (Addendum)
Please continue: - Toujeo 20 units daily (25 units during steroid treatment) - Novolog  14 units for smaller meals (17 units during steroid treatment) 17 units for large meals  (20 units during steroid treatment)   Please attach the CGM as soon as possible.  Please continue levothyroxine 50 mcg daily.   Take the thyroid hormone every day, with water, at least 30 minutes before breakfast, separated by at least 4 hours from: - acid reflux medications - calcium - iron - multivitamins   Please return in 2 months.

## 2022-01-23 NOTE — Progress Notes (Signed)
Patient ID: [name], female   DOB: 11/20/1950, 71 y.o.   MRN: [id number]  HPI: [name] is a 71 y.o.-year-old female, initially referred by her PCP, [name], presenting for follow-up for DM2, dx 1995, insulin-dependent since 1997, uncontrolled, with complications (gastroparesis, cerebrovasc. Ds-  H/o stroke, PN),  Hypothyroidism, thyroid nodules.  Last visit 4 months ago.  Interim history: She is  still on steroid injections in her shoulder and sugars were high.  She is seeing pain medicine. She also continues to have neck pain, headaches, disequilibrium.  She finished physical therapy. She has blurry vision but no chest pain, palpitations, or increased urination. 2 and 3 months ago she had low blood sugars in the 30s: 36 and 38, respectively!  She mentions that at that time, she was not eating well. She received a freestyle libre 2 CGM since last visit but she cannot check with her phone and did not receive a receiver, so she was not able to use it.  DM2: Reviewed HbA1c levels: Lab Results  Component Value Date   HGBA1C 5.7 (H) 12/23/2021   HGBA1C 5.5 09/19/2021   HGBA1C 5.6 09/16/2021  10/28/2013: HbA1c 8.5%  She is on: - Toujeo 15 >> 20 units (25 units after steroid injections) - Novolog  10 units for smaller meals >> using 14 units 15 units for large meals >> using 17 units  (20 units if on steroid injections) Could not tolerate Metformin >> nausea.   Insulin doses were decreased in 04/2017 due to low blood sugars in the 40s. Nephrology started [place] but stopped "it did not agree with me" - reportedly.  Pt checks her sugars 4 times a day: - am:  120-174, 250 this am (checked with her husband's machine), 300 with steroids >> 140-170, 200s - 2h after b'fast: n/c >> 110 >> n/c - before lunch: 103-171, 200-385 (high with steroids) >> 89-192 >> 140-155 - 2h after lunch:  n/c >> 127, 215, 241 >> n/c - before dinner:  91-176 (266-401) (high with steroids) >> 120-130 >>  140-155 - 2h after dinner: n/c - bedtime: 60, 104-173 >> 83-190, 194-414 >> 99-100s >> 200s, 325 - steroids - nighttime: n/c >> 134 >> n/c Lowest sugar was 64 >> 40s >> 89 >> 30s (slept and delayed meals, not eating well); she has hypoglycemia awareness in the 70s. Highest sugar was 542 (steroid inj) ... >> 414 (steroid) >> 300s >> 300s (steroids).  Pt's meals are: - Breakfast: eggs, cereals, fruit, oatmeal, bacon - Lunch: sandwich, beef, mashed potatoes,diet soda - Dinner: chicken, beef, fish, potatoes, vegetables - Snacks: 1-2: fruit  -+ Mild CKD; last BUN/creatinine:  Lab Results  Component Value Date   BUN 30 (H) 12/23/2021   CREATININE 1.35 (H) 12/23/2021  On losartan.  -+ HL; last set of lipids: Lab Results  Component Value Date   CHOL 155 05/10/2021   HDL 42 05/10/2021   LDLCALC 79 05/10/2021   TRIG 200 (H) 05/10/2021   CHOLHDL 3.7 05/10/2021  On Crestor and Zetia.  - last eye exam was in 05/2021: + DR; Had cataract sx B. Dr. Hassell Done [facility]). + L eye pain.  -She has numbness and tingling mainly in the left leg-affected by stroke. Sees podiatry.  She had foot exam by podiatry (Dr. Elisha Ponder) 12/06/2021.  Hypothyroidism: -Diagnosed many years ago  She takes levothyroxine 50 mcg daily: - in am - fasting - at least 30 min from b'fast - no calcium - no iron - +  multivitamins with lunch  - + Aciphex -moved later in the day - no PPIs - +on Biotin and B complex  Reviewed her TFTs: Lab Results  Component Value Date   TSH 0.494 10/04/2021   TSH 0.710 09/16/2021   TSH 0.85 05/19/2021   TSH 0.631 12/11/2019   TSH 0.94 02/17/2019   Thyroid nodules.    Pt denies: - feeling nodules in neck - hoarseness But she continues to have chronic dysphagia with certain foods.  She had previous esophageal dilations. She also has choking.  I reviewed previous imaging and biopsy tests: FNA (2014) x2: Benign  thyroid U/S (10/2013): Right thyroid lobe: 3.7 x 1.0 x 1.8 cm  Left  thyroid lobe: 3.5 x 1.5 x 1.6 cm  Isthmus: 0.5 cm  Focal nodules:  There is a 0.3 mm calcified nodule in the right midzone.  There is a 0.6 x 0.7 x 0.5 cm hypoechoic nodule in the  lateral aspect of the right midzone.  There is a 0.7 x 0.6 x 0.5 cm hyperechoic nodule in the lateral aspect of the right lower pole.  There is a 2.2 x 1.1 x 1.7 cm complex solid nodule in the inferior aspect of the isthmus extending adjacent to the left lower pole.  There is a 1.4 x 2.1 x 1.4 cm complex solid nodule in the left lower pole.  Lymphadenopathy: None visualized.  Repeat U/S (01/2014): Stable appearance of the nodules  Repeat U/S (10/2015): stable appearance of the nodules  Repeat U/S (10/2016): Stable appearance of the nodules  Repeat U/S (02/02/2021): Stable appearance of nodules  She had a L rib fracture in 12/2014. She had a R ankle fracture 02/20/2020 >> bone stimulator to help with healing. Patient was admitted with AMS + sepsis on 10/15/2016.  She had aspiration pneumonia and acute respiratory failure She has a recurrent meningioma >> seeing Dr. Rita Ohara. She was admitted 09/15/2021 after a fall, and found to have dehydration, AKI and hyperkalemia (6.2) -considered the reason for her weakness and subsequent fall.   She is on Fosamax.  ROS: + see HPI  I reviewed pt's medications, allergies, PMH, social hx, family hx, and changes were documented in the history of present illness. Otherwise, unchanged from my initial visit note.  Past Medical History:  Diagnosis Date   Allergy    Anemia    Anemia in chronic kidney disease (CKD) 08/10/2021   Anemia in chronic kidney disease (CKD) 10/12/2021   Anxiety    takes Ativan daily   Arthritis    Assistance needed for mobility    Bipolar disorder ([place])    takes Risperdal nightly   Blood transfusion    Brain tumor ([place])    Cancer ([place])    In her gum   Carpal tunnel syndrome of right wrist 05/23/2011   Cervical disc disorder with radiculopathy  of cervical region 10/31/2012   Chronic back pain    Chronic idiopathic constipation    Chronic neck and back pain    Colon polyps    COPD (chronic obstructive pulmonary disease) with chronic bronchitis 09/16/2013   Office Spirometry 10/30/2013-submaximal effort based on appearance of loop and curve. Numbers would fit with severe restriction but her physiologic capability may be better than this. FVC 0.91/44%, and 10.74/45%, FEV1/FVC 0.81, FEF 25-75% 1.43/69%     Diabetes mellitus    Type II   Diverticulosis    TCS 9/08 by Dr. Delfin Edis for diarrhea . Bx for micro scopic colitis negative.  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   Metabolic encephalopathy 22/04/5425   Migraines    chronic headaches   Mononeuritis lower limb    Narcolepsy    Osteoporosis    Pancreatitis 2006   due to Depakote with normal EUS    Paralysis (HCC)    Schatzki's ring    non critical / EGD with ED 8/2011with RMR   Seizures ([place])    takes Lamictal daily.Last seizure 3 yrs ago   Sleep apnea    on CPAP   Small bowel obstruction (HCC)    Stroke ([place])    left sided weakness, speech changes   Tubular adenoma of colon    Past Surgical History:  Procedure Laterality Date   ABDOMINAL HYSTERECTOMY  1978   BACK SURGERY  July 2012   BACTERIAL OVERGROWTH TEST N/A 05/05/2013   Procedure: BACTERIAL OVERGROWTH TEST;  Surgeon: Daneil Dolin, MD;  [facility];  Service: Endoscopy;  Laterality: N/A;  7:30   BIOPSY THYROID  2009    BRAIN SURGERY  11/2011   resection of meningioma   BREAST REDUCTION SURGERY  1994   CARDIAC CATHETERIZATION  05/10/2005   normal coronaries, normal LV systolic function and EF (Dr. Jackie Plum)   [place] Left 07/22/04   Dr. Aline Brochure   CATARACT EXTRACTION Bilateral    CHOLECYSTECTOMY  1984   COLONOSCOPY N/A 09/25/2012   CWC:BJSEGBT diverticulosis.  colonic polyp-removed : tubular adenoma   CRANIOTOMY  11/23/2011   Procedure: CRANIOTOMY TUMOR EXCISION;  Surgeon: Hosie Spangle, MD;  Location: [place] [facility]URO ORS;  Service: Neurosurgery;  Laterality: N/A;  Craniotomy for tumor resection   ESOPHAGOGASTRODUODENOSCOPY  12/29/2010   [place]-Retained food in the esophagus and stomach, small hiatal hernia, status post Maloney dilation of the esophagus   ESOPHAGOGASTRODUODENOSCOPY N/A 09/25/2012   DVV:OHYWVPXT atonic baggy esophagus status post Maloney dilation 39 F. Hiatal hernia   GIVENS CAPSULE STUDY N/A 01/15/2013   NORMAL.    IR GENERIC HISTORICAL  03/17/2016   IR RADIOLOGIST EVAL & MGMT 03/17/2016 [facility]INTER[facility] RAD   LESION REMOVAL N/A 05/31/2015   Procedure: REMOVAL RIGHT AND LEFT LESIONS OF MANDIBLE;  Surgeon: Diona Browner, DDS;  Location: [place];  Service: Oral Surgery;  Laterality: N/A;   MALONEY DILATION  12/29/2010   RMR;   NM MYOCAR PERF WALL MOTION  2006   "relavtiely normal" persantine, mild anterior thinning (breast attenuation artifact), no region of scar/ischemia   OVARIAN CYST REMOVAL     RECTOCELE REPAIR N/A 06/29/2015   Procedure: POSTERIOR REPAIR (RECTOCELE);  Surgeon: Jonnie Kind, MD;  Location: [facility];  Service: Gynecology;  Laterality: N/A;   REDUCTION MAMMAPLASTY Bilateral    SPINE SURGERY  09/29/2010   Dr. Rolena Infante   surgical excision of 3 tumors from right thigh and right buttock  and left upper thigh  2010   TOOTH EXTRACTION Bilateral 12/14/2014   Procedure: REMOVAL OF BILATERAL  MANDIBULAR EXOSTOSES;  Surgeon: Diona Browner, DDS;  Location: [place];  Service: Oral Surgery;   Laterality: Bilateral;   TRANSTHORACIC ECHOCARDIOGRAM  2010   EF 60-65%, mild conc LVH, grade 1 diastolic dysfunction; mildly calcified MV annulus with mildly thickened leaflets, mildly calcified MR annulus   History   Social History   Marital Status: Divorced    Spouse Name: N/A    Number of Children: 1   Years of Education: 12   Occupational History   disabled     Social History Main Topics   Smoking status: Current Every Day Smoker -- 0.25 packs/day for 7 years    Types: Cigarettes   Smokeless tobacco: Never Used     Comment: "started back but off now for 3 months" (08/18/13)   Alcohol Use: No     Comment:     Drug Use: No   Current Outpatient Medications on File Prior to Visit  Medication Sig Dispense Refill   ACCU-CHEK GUIDE test strip TEST BLOOD SUGAR FOUR TIMES DAILY  AND AS NEEDED 450 strip 2   Accu-Chek Softclix Lancets lancets TEST BLOOD SUGAR THREE TIMES DAILY AS DIRECTED 300 each 2   acetaminophen (TYLENOL) 325 MG tablet Take 650 mg by mouth every 6 (six) hours as needed.     albuterol (PROVENTIL) (2.5 MG/3ML) 0.083% nebulizer solution Take 3 mLs (2.5 mg total) by nebulization every 6 (six) hours as needed for wheezing or shortness of breath. 75 mL 12   albuterol (PROVENTIL) (2.5 MG/3ML) 0.083% nebulizer solution Take 3 mLs (2.5 mg total) by nebulization every 6 (six) hours as needed for wheezing or shortness of breath. 150 mL 2   albuterol (VENTOLIN HFA) 108 (90 Base) MCG/ACT inhaler INHALE 1 TO 2 PUFFS EVERY 6 HOURS AS NEEDED FOR WHEEZING, SHORTNESS OF BREATH (Patient taking differently: Inhale 1-2 puffs into the lungs every 6 (six) hours as needed for wheezing or shortness of breath.) 3 each 3   Alcohol Swabs (DROPSAFE ALCOHOL PREP) 70 % PADS USE TO CLEAN FINGER PRIOR TO TESTING FOR BLOOD SUGAR  AS DIRECTED 300 each 1   alendronate (FOSAMAX) 70 MG tablet TAKE 1 TABLET EVERY 7 DAYS ON AN EMPTY STOMACH WITH A FULL GLASS OF WATER (Patient taking differently: Take 70 mg by  mouth once a week.) 12 tablet 3   amLODipine (NORVASC) 10 MG tablet TAKE 1 TABLET EVERY DAY (Patient taking differently: Take 10 mg by mouth daily.) 90 tablet 1   ascorbic acid (VITAMIN C) 500 MG tablet Take 500 mg by mouth daily.     aspirin EC 81 MG tablet Take 1 tablet (81 mg total) by mouth daily with breakfast. 120 tablet 2   Blood Glucose Calibration (ACCU-CHEK GUIDE CONTROL) LIQD USE AS DIRECTED 1 each 0   blood glucose meter kit and supplies Dispense based on patient and insurance preference. Use up to four times daily as directed. (FOR ICD-10 E10.9, E11.9). 1 each 0   buPROPion (WELLBUTRIN XL) 150 MG 24 hr tablet Take 1 tablet (150 mg total) by mouth every morning. 90 tablet 2   Calcium Carb-Cholecalciferol (CALTRATE 600+D3 SOFT) 600-20 MG-MCG CHEW Chew 1 tablet by mouth daily at 12 noon.     cetirizine (ZYRTEC) 10 MG tablet Take 1 tablet (10 mg total) by mouth daily. 30 tablet 1   Cholecalciferol (D3 PO) Take 1 tablet by mouth daily.     Clobetasol Prop Emollient Base (CLOBETASOL PROPIONATE E) 0.05 % emollient cream Apply to affected area qd (Patient  taking differently: Apply 1 Application topically daily.) 60 g 6   Continuous Blood Gluc Sensor (FREESTYLE LIBRE 14 DAY SENSOR) MISC 1 each by Does not apply route every 14 (fourteen) days. Change every 2 weeks 2 each 11   diclofenac Sodium (VOLTAREN) 1 % GEL APPLY 2 GRAMS TOPICALLY FOUR TIMES DAILY. (Patient taking differently: Apply 2 g topically 4 (four) times daily.) 700 g 0   Docusate Sodium (DSS) 100 MG CAPS Take 1 tablet by mouth in the morning and at bedtime.     doxycycline (VIBRAMYCIN) 100 MG capsule Take 1 capsule (100 mg total) by mouth 2 (two) times daily. 20 capsule 0   DROPLET PEN NEEDLES 31G X 8 MM MISC USE FOR INJECTING INSULIN 4 TIMES DAILY. 400 each 0   ezetimibe (ZETIA) 10 MG tablet TAKE 1 TABLET EVERY DAY 90 tablet 10   furosemide (LASIX) 20 MG tablet Take 1 tablet (20 mg total) by mouth daily. 30 tablet 1    hydrocortisone (ANUSOL-HC) 2.5 % rectal cream Place 1 Application rectally 2 (two) times daily. 60 g 0   insulin aspart (NOVOLOG FLEXPEN) 100 UNIT/ML FlexPen Inject 5 Units into the skin 3 (three) times daily with meals. Give if eats 50% or more of meal. (Patient taking differently: Inject 14 Units into the skin 3 (three) times daily with meals. Give if eats 50% or more of meal.) 60 mL 0   insulin glargine, 2 Unit Dial, (TOUJEO MAX SOLOSTAR) 300 UNIT/ML Solostar Pen Inject 20-25 Units into the skin daily. 12 mL 0   ipratropium (ATROVENT) 0.03 % nasal spray Place 2 sprays into both nostrils every 12 (twelve) hours. 30 mL 12   JARDIANCE 10 MG TABS tablet Take 10 mg by mouth daily.     lactobacillus acidophilus (BACID) TABS tablet Take 2 tablets by mouth 3 (three) times daily.     lamoTRIgine (LAMICTAL) 100 MG tablet Take 1 tablet (100 mg total) by mouth 2 (two) times daily. 180 tablet 2   levothyroxine (SYNTHROID) 50 MCG tablet TAKE 1 TABLET EVERY DAY FOR 6 DAYS PER WEEK, 1/2 TABLET 1 DAY PER WEEK 90 tablet 1   LORazepam (ATIVAN) 0.5 MG tablet Take 1 tablet (0.5 mg total) by mouth 2 (two) times daily. 60 tablet 2   LORazepam (ATIVAN) 1 MG tablet TAKE 1 TABLET BY MOUTH ONCE AS NEEDED FOR UP TO 1 DOSE FOR ANXIETY. 3 tablet 0   losartan (COZAAR) 50 MG tablet TAKE 1 TABLET EVERY DAY 90 tablet 1   meclizine (ANTIVERT) 25 MG tablet Take 1 tablet (25 mg total) by mouth 3 (three) times daily as needed for dizziness. 30 tablet 0   metoprolol tartrate (LOPRESSOR) 50 MG tablet TAKE 1 TABLET TWICE DAILY (NEED MD APPOINTMENT) (Patient taking differently: Take 50 mg by mouth 2 (two) times daily.) 180 tablet 3   Misc. Devices (MATTRESS PAD) MISC FIRM MATTRESS PAD for hospital bed x 1 1 each 0   montelukast (SINGULAIR) 10 MG tablet TAKE 1 TABLET EVERY DAY (Patient taking differently: Take 10 mg by mouth at bedtime.) 90 tablet 1   Multiple Vitamins-Minerals (MULTIVITAMIN GUMMIES ADULT PO) Take 1 tablet by mouth daily.      MYRBETRIQ 25 MG TB24 tablet TAKE 1 TABLET EVERY DAY 90 tablet 1   nystatin (MYCOSTATIN/NYSTOP) powder APPLY TO AFFECTED AREA 4 TIMES DAILY. (Patient taking differently: Apply 1 Application topically in the morning, at noon, in the evening, and at bedtime.) 30 g 2   Omega-3 Fatty Acids (FISH  OIL PO) Take 1 capsule by mouth daily.     ondansetron (ZOFRAN) 4 MG tablet Take 1 tablet (4 mg total) by mouth every 8 (eight) hours as needed for nausea or vomiting. 20 tablet 2   oxyCODONE-acetaminophen (PERCOCET) 10-325 MG tablet Take 1 tablet by mouth every 8 (eight) hours as needed for pain.     potassium chloride (KLOR-CON M) 10 MEQ tablet Take 1 tablet (10 mEq total) by mouth 2 (two) times daily. 60 tablet 1   potassium chloride (KLOR-CON) 10 MEQ tablet Take 10 mEq by mouth 2 (two) times daily.     predniSONE (DELTASONE) 20 MG tablet Take 1 tablet (20 mg total) by mouth daily. 4 tablet 0   pregabalin (LYRICA) 75 MG capsule Take 1 capsule (75 mg total) by mouth daily.     RABEprazole (ACIPHEX) 20 MG tablet TAKE 1 TABLET TWICE DAILY 180 tablet 2   RESTASIS 0.05 % ophthalmic emulsion Place 2 drops into both eyes daily.     risperiDONE (RISPERDAL) 0.5 MG tablet Take 1 tablet (0.5 mg total) by mouth at bedtime. 90 tablet 2   rosuvastatin (CRESTOR) 5 MG tablet TAKE 1 TABLET AT BEDTIME. 90 tablet 1   sertraline (ZOLOFT) 100 MG tablet Take 1 tablet (100 mg total) by mouth every morning. 90 tablet 2   STIOLTO RESPIMAT 2.5-2.5 MCG/ACT AERS INHALE 2 PUFFS INTO THE LUNGS DAILY. 12 g 1   telmisartan (MICARDIS) 20 MG tablet Take 10 mg by mouth daily.     tizanidine (ZANAFLEX) 2 MG capsule Take 1 capsule (2 mg total) by mouth 3 (three) times daily. Do not drink alcohol or drive while taking this medication.  May cause drowsiness. 15 capsule 0   traZODone (DESYREL) 150 MG tablet Take 1 tablet (150 mg total) by mouth at bedtime. 90 tablet 2   vitamin E 180 MG (400 UNITS) capsule Take 400 Units by mouth daily.     No  current facility-administered medications on file prior to visit.      Allergies  Allergen Reactions   Cephalexin Hives   Iron Nausea And Vomiting   Milk-Related Compounds Other (See Comments)    Doesn't agree with stomach.    Penicillins Hives        Phenazopyridine Hcl Hives         Family History  Problem Relation Age of Onset   Heart attack Mother        HTN   Pneumonia Father    Kidney failure Father    Diabetes Father    Pancreatic cancer Sister    Cancer Sister        breast    Cancer Sister        pancreatic   Diabetes Brother    Hypertension Brother    Diabetes Brother    Alcohol abuse Maternal Uncle    Stroke Maternal Grandmother    Heart attack Maternal Grandfather    Hypertension Son    Sleep apnea Son    Colon cancer Neg Hx    Anesthesia problems Neg Hx    Hypotension Neg Hx    Malignant hyperthermia Neg Hx    Pseudochol deficiency Neg Hx    Breast cancer Neg Hx    Stomach cancer Neg Hx    PE: BP 132/82 (BP Location: Right Arm, Patient Position: Sitting, Cuff Size: Normal)   Pulse (!) 59   Ht 5' 2" (1.575 m)   Wt 165 lb 6.4 oz (75 kg)   SpO2  96%   BMI 30.25 kg/m    Wt Readings from Last 3 Encounters:  01/23/22 165 lb 6.4 oz (75 kg)  01/16/22 170 lb 9.6 oz (77.4 kg)  12/20/21 168 lb (76.2 kg)   Constitutional: overweight, in NAD Eyes: EOMI, no exophthalmos ENT: no thyromegaly, no cervical lymphadenopathy Cardiovascular: RRR, No MRG Respiratory: CTA B Musculoskeletal: no deformities Skin: moist, warm, no rashes Neurological: no tremor with outstretched hands  ASSESSMENT: 1. DM2, insulin-dependent, uncontrolled, without complications - gastroparesis - cerebrovasc. Ds -  H/o stroke - PN  She was interested in an insulin pump >> discussed about [employer] (given brochure).  2. Hypothyroidism  3. MNG - Thyroid U/S (10/11/2012): Right thyroid lobe:  3.7 x 1.0 x 1.8 cm Left thyroid lobe:   3.5 x 1.5 x 1.6 cm Isthmus:  0.5 cm Focal  nodules:  There is a 0.3 mm calcified nodule in the right midzone.  There is a 0.6 x 0.7 x 0.5 cm hypoechoic nodule in the lateral aspect of the right midzone.  There is a 0.7 x 0.6 x 0.5 cm hyperechoic nodule in the lateral aspect of the right lower pole. There is a 2.2 x 1.1 x 1.7 cm complex solid nodule in the inferior aspect of the isthmus extending adjacent to the left lower pole. There is a 1.4 x 2.1 x 1.4 cm complex solid nodule in the left lower pole.  Lymphadenopathy:  None visualized.   IMPRESSION: Multi nodular goiter which has markedly progressed since the prior exam.  The dominant nodule at the inferior aspect of the isthmus fits criteria for fine needle aspiration biopsy if not previously Assessed.  - FNA (11/05/2012) x2: benign  - Thyroid U/S (01/20/2014) - felt one nodule enlarging >> new U/S: stable appearance of the nodules, except a small new nodule in isthmus: 1.2 cm  Right thyroid lobe Measurements: 4.4 cm x 1.2 cm x 1.7 cm. Multiple right-sided nodules identified. Each right-sided nodule demonstrates increased echogenicity, with the superior measuring 7 mm -8 mm, most inferior measuring 9 mm - 10 mm. A small, 3 mm focus of calcium with posterior shadowing is evident. There is also a mid nodule measuring 7 mm, which is echogenic. Left thyroid lobe Measurements: 5.1 cm x 2.1 cm x 2.3 cm. Dominant lesion at the inferior pole of left thyroid is again evident, which has been previously biopsied (10/29/2012). This nodule measures 1.8 cm x 1.7cm x 2.5 cm. (Previous 1.4 cm x 2.1 cm x 1.4 cm) Isthmus Thickness: 4 mm-5 mm. Isthmic nodule again noted, previously biopsied (10/29/2012). Currently this nodule measures 2.2 cm x 1.4 cm x 1.8 cm. (previous measurement 2.2 cm x 1.1 cm x 1.7 cm). There is a new nodule identified within the isthmus with heterogeneously hyperechoic characteristics. This nodule measures 12 mm x 5.4 mm x 7.2 mm. Lymphadenopathy: None visualized.  - Thyroid U/S  (11/09/2015): Parenchymal Echotexture: Moderately heterogenous Estimated total number of nodules > 1 cm: <5 Number of spongiform nodules > 2 cm not described below (TR1): 0 Number of mixed cystic nodules > 1.5 cm not described below ([place]): 0 _____________________________________________________  Isthmus: 0.7 cm  Nodule # 1: Prior biopsy: No Location: Isthmus; left Size: 1.5 x 1.3 x 1.5 cm, previously 2.2 x 1.4 x 1.8 cm Composition: solid/almost completely solid (2) Echogenicity: hyperechoic (1) ACR TI-RADS total points: 3. Change in features: Yes. Increased internal cystic degeneration and smaller in size. Change in ACR TI-RADS risk category: No  *Given size (1.5 - 2.4 cm)  and appearance, a follow-up ultrasound in 1 year is recommended based on TI-RADS criteria as clinically indicated.  _________________________________________________________  Right lobe: 4.6 x 1.1 x 1.9 cm, previously 4.4 x 1.2 x 1.7 cm Stable scattered subcentimeter echogenic nodules _________________________________________________________  Left lobe: 4.8 x 2.1 x 1.9 cm, previously 5.1 x 2.1 x 2.3 cm Nodule # 1: Prior biopsy: No Location: Left; Inferior Size: 2.4 x 1.7 x 1.9 cm, previously 2.5 x 1.7 x 1.7 Composition: solid/almost completely solid (2) Echogenicity: hyperechoic (1) ACR TI-RADS total points: 3. ACR TI-RADS risk category: TR3 (3 points). Change in features: No Change in ACR TI-RADS risk category: No *Given size (1.5 - 2.4 cm) and appearance, a follow-up ultrasound in 1 year is recommended based on TI-RADS criteria as clinically indicated.  IMPRESSION: TR 3 isthmic and left thyroid nodules unchanged. *Given size (1.5 - 2.4 cm) and appearance of both thyroid nodules, a follow-up ultrasound in 1 year is recommended based on TI-RADS criteria as clinically indicated.  10/12/2016: Thyroid U/S: Parenchymal Echotexture: Moderately heterogenous Isthmus: 1.1 cm Right lobe: 3.8 x 1.0 x 1.7  cm Left lobe: 4.5 x 1.7 x 2.1 cm  ______________________________________________________   Estimated total number of nodules >/= 1 cm: 3  _____________________________________________   Diffusely heterogeneous multinodular thyroid gland. As seen previously, there are multiple small echogenic nodules scattered throughout the right gland which do not meet criteria for biopsy or follow-up. Head and lobular pyramidal lobe extends superiorly from the thyroid isthmus. No interval change.   The previously biopsied nodule in the inferior aspect of the isthmus measures slightly smaller today at 1.5 x 1.0 x 1.4 cm compared to 1.7 x 1.3 x 1.5 cm previously.   The previously biopsied nodule in the left inferior gland is essentially unchanged at 2.6 x 1.3 x 1.5 cm compared to 2.4 x 1.8 x 1.9 cm. No new nodule or abnormality identified.   IMPRESSION: 1. Stable multinodular goiter with a prominent lobular parental lobe extending superiorly from the thyroid isthmus. 2. The previously biopsied nodule in the inferior isthmus measures slightly smaller on today's examination. 3. The previously biopsied nodule in the left inferior gland is stable. 4. Additional scattered small and benign-appearing nodules do not meet criteria for biopsy or continued follow-up.  Thyroid U/S (02/02/2021): Parenchymal Echotexture: Mildly heterogeneous Isthmus: 0.6 cm Right lobe: 4.4 x 0.8 x 1.7 cm  Left lobe: 4.5 x 2.1 x 3.6 cm  ______________________________________________________   Nodule # 1:  Prior biopsy: Yes-10/29/2012  Location: Isthmus; superior  Maximum size: 2.6 cm; Other 2 dimensions: 1.0 x 2.0 cm, previously, 1.5 x 1.0 x 1.4 cm  Composition: mixed cystic and solid (1)  Echogenicity: isoechoic (1) Significant change in size (>/= 20% in two dimensions and minimal increase of 2 mm): Yes (predominately due to increase in cystic component) Change in features: Yes Change in ACR TI-RADS risk category:  Yes This nodule does NOT meet TI-RADS criteria for biopsy or dedicated follow-up.  _________________________________________________________   Nodule 2: 1.0 x 0.8 x 0.8 cm hypoechoic (actually hyperechoic) nodule in the inferior right thyroid lobe does (NOT) meet criteria for FNA or imaging surveillance. _________________________________________________________   There is diffuse heterogeneity of the left thyroid lobe without discrete nodules.   IMPRESSION: Previously biopsied isthmus nodule demonstrates interval development of cystic component. It does not meet criteria for repeat biopsy at this time. No new suspicious thyroid nodules.   This did not show appears to have increased in size, but this is actually due to  the increased cystic component.  This nodule has been previously biopsied with benign results. Called and discussed with Dr.Mir with [facility] and he acknowledged that the changes in red above have been dictation errors and he addended the report.  PLAN:  1. Patient with longstanding, uncontrolled, type 2 diabetes, on basal boluses regimen, with fluctuating CBGs.  HbA1c at last visit was 5.5%, lower but she had another HbA1c obtained last month which was slightly higher, but still at goal, at 5.7%.  Of note, her HbA1c levels are not completely correlating with her blood sugars at home and we only use these for trends.  At last visit I recommended a CGM and we increased the doses of her insulins as they were decreased in the hospital. -With her history of dehydration and hyperkalemia in the setting of AKI, she is not a good candidate for an SGLT2 inhibitor.  In fact, this was tried by her nephrologist but further testing prompted discontinuation, reportedly.  - She brings a Libre 2 sensor, however, she does not have a receiver and cannot check with her phone.  At today's visit, we will send the new Rx to the supplier (this was sent before so I am not sure why she did not receive  the reader).  I advised her to come back by her clinic after she has this so we can attach it and show her how to use the device.  This will be paramount for her since she did have some low blood sugars when she was not eating well 2 to 3 months ago.  She is now eating better and her sugars have not been low recently, but rather on the higher side.  We did discuss that when she had steroid injections (which is frequently), she may need higher doses of insulin before now, I did advise her to continue the current regimen.  I believe that attaching the CGM will help with blood sugar control.  I also plan to see her back sooner for further adjustment of insulin doses. - I advised her to:  Patient Instructions  Please continue: - Toujeo 20 units daily (25 units during steroid treatment) - Novolog  14 units for smaller meals (17 units during steroid treatment) 17 units for large meals  (20 units during steroid treatment)   Please attach the CGM as soon as possible.  Please continue levothyroxine 50 mcg daily.   Take the thyroid hormone every day, with water, at least 30 minutes before breakfast, separated by at least 4 hours from: - acid reflux medications - calcium - iron - multivitamins   Please return in 2 months.  - advised to check sugars at different times of the day - 4x a day, rotating check times - advised for yearly eye exams >> she is UTD - return to clinic in 4 months   2. Hypothyroidism - latest thyroid labs reviewed with pt. >> normal: Lab Results  Component Value Date   TSH 0.494 10/04/2021  - she continues on LT4 50 mcg daily - pt feels good on this dose. - we discussed about taking the thyroid hormone every day, with water, >30 minutes before breakfast, separated by >4 hours from acid reflux medications, calcium, iron, multivitamins. Pt. is taking it correctly.I advised the home health nurse to move Aciphex at least 4 hours later (located in the patient instruction  section).  3. MNG -She has a chest tube benign thyroid nodule biopsied in 2014 -Reviewing the report of her thyroid  ultrasound from 2014-2022, these nodules have remained stable -She still has chronic mild dysphagia, with no esophageal compression from the thyroid (per swallow study from 08/2014). She has a history of multiple esophageal dilations. -She had another ultrasound in 01/2021 and one of the nodules appears to be larger, but this was due to the increase in cystic component.  Since the nodules were stable, I did not suggest further intervention.  Philemon Kingdom, MD PhD [facility] Endocrinology

## 2022-01-24 ENCOUNTER — Telehealth: Payer: Self-pay | Admitting: Internal Medicine

## 2022-01-24 NOTE — Telephone Encounter (Signed)
Order for a split night study was placed at pt's last OV with BW 11/6. Routing to [facility] to see when they might be able to get this scheduled for pt.

## 2022-01-25 ENCOUNTER — Telehealth: Payer: Self-pay | Admitting: Family Medicine

## 2022-01-25 ENCOUNTER — Ambulatory Visit: Payer: Medicare HMO | Admitting: Family Medicine

## 2022-01-25 NOTE — Telephone Encounter (Signed)
Pt called stating her kidney dr does not want her to take her potassium & fluid pill everyday. She is wanting to know if this can be changed to as needed?

## 2022-01-26 NOTE — Progress Notes (Signed)
[name], [place] [address]   CLINIC:  Medical Oncology/Hematology  PCP:  [name], MD [address], [address] [place] [address] [phone]   REASON FOR VISIT:  Follow-up for microcytic anemia/iron deficiency anemia   PRIOR THERAPY: Iron tablets   CURRENT THERAPY: Intermittent IV iron + Procrit since 10/12/2021   INTERVAL HISTORY:  [name] 71 y.o. female returns for routine follow-up of her microcytic iron deficiency anemia.  She was last seen by Tarri Abernethy PA-C on 12/01/2021.   At today's visit, she reports feeling poorly due to chronic pain and severe fatigue.  She was started on Procrit injections in August 2023.  Blood pressure today is 133/60, overall appears to be within her baseline range.  She has chronic breathing issues but denies any acute changes that would be concerning for DVT or PE.  She continues to have unchanged chronic fatigue.   She reports occasional dark stool, with last episode several months ago. She denies any bright red blood per rectum or hematochezia.  She has improved pica cravings for ice chips as well as occasional headaches.  She denies any restless legs, chest pain, or syncope. She has chronic dyspnea on exertion and lightheadedness.  She denies any fever, chills, or unintentional weight loss.  She has 20% energy and 50% appetite. She endorses that she is maintaining a stable weight.   REVIEW OF SYSTEMS:    Review of Systems  Constitutional:  Positive for fatigue. Negative for appetite change, chills, diaphoresis, fever and unexpected weight change.  HENT:   Positive for trouble swallowing. Negative for lump/mass and nosebleeds.   Eyes:  Negative for eye problems.  Respiratory:  Positive for cough and shortness of breath (with exertion). Negative for hemoptysis.   Cardiovascular:  Positive for palpitations. Negative for chest pain and leg swelling.  Gastrointestinal:  Positive for  diarrhea. Negative for abdominal pain, blood in stool, constipation, nausea and vomiting.  Genitourinary:  Negative for hematuria.   Musculoskeletal:  Positive for arthralgias (Generalized body pain), back pain and neck pain.  Skin: Negative.   Neurological:  Positive for headaches, light-headedness and numbness. Negative for dizziness.  Hematological:  Does not bruise/bleed easily.  Psychiatric/Behavioral:  Positive for depression and sleep disturbance.       PAST MEDICAL/SURGICAL HISTORY:  Past Medical History:  Diagnosis Date   Allergy    Anemia    Anemia in chronic kidney disease (CKD) 08/10/2021   Anemia in chronic kidney disease (CKD) 10/12/2021   Anxiety    takes Ativan daily   Arthritis    Assistance needed for mobility    Bipolar disorder ([place])    takes Risperdal nightly   Blood transfusion    Brain tumor ([facility])    Cancer ([place])    In her gum   Carpal tunnel syndrome of right wrist 05/23/2011   Cervical disc disorder with radiculopathy of cervical region 10/31/2012   Chronic back pain    Chronic idiopathic constipation    Chronic neck and back pain    Colon polyps    COPD (chronic obstructive pulmonary disease) with chronic bronchitis 09/16/2013   Office Spirometry 10/30/2013-submaximal effort based on appearance of loop and curve. Numbers would fit with severe restriction but her physiologic capability may be better than this. FVC 0.91/44%, and 10.74/45%, FEV1/FVC 0.81, FEF 25-75% 1.43/69%     Diabetes mellitus    Type II   Diverticulosis    TCS 9/08 by  Dr. Delfin Edis for diarrhea . Bx for micro scopic colitis negative.    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   Metabolic encephalopathy 34/28/7681   Migraines    chronic headaches   Mononeuritis lower limb    Narcolepsy    Osteoporosis    Pancreatitis 2006   due to Depakote with normal EUS    Paralysis (HCC)    Schatzki's ring    non critical / EGD with ED 8/2011with RMR   Seizures ([place])    takes Lamictal daily.Last seizure 3 yrs ago   Sleep apnea    on CPAP   Small bowel obstruction (HCC)    Stroke ([place])    left sided weakness, speech changes   Tubular adenoma of colon    Past Surgical History:  Procedure Laterality Date   ABDOMINAL HYSTERECTOMY  1978   BACK SURGERY  July 2012   BACTERIAL OVERGROWTH TEST N/A 05/05/2013   Procedure: BACTERIAL OVERGROWTH TEST;  Surgeon: Daneil Dolin, MD;  [facility];  Service: Endoscopy;  Laterality: N/A;  7:30   BIOPSY THYROID  2009   BRAIN SURGERY  11/2011   resection of meningioma   BREAST REDUCTION SURGERY  1994   CARDIAC CATHETERIZATION  05/10/2005   normal coronaries, normal LV systolic function and EF (Dr. Jackie Plum)   [place] Left 07/22/04   Dr. Aline Brochure   CATARACT EXTRACTION Bilateral    CHOLECYSTECTOMY  1984   COLONOSCOPY N/A 09/25/2012   LXB:WIOMBTD diverticulosis.  colonic polyp-removed : tubular adenoma   CRANIOTOMY  11/23/2011   Procedure: CRANIOTOMY TUMOR EXCISION;  Surgeon: Hosie Spangle, MD;  Location: [facility];  Service: Neurosurgery;  Laterality: N/A;  Craniotomy for tumor resection   ESOPHAGOGASTRODUODENOSCOPY  12/29/2010   [place]-Retained food in the esophagus and stomach, small hiatal hernia, status post Maloney dilation of the esophagus   ESOPHAGOGASTRODUODENOSCOPY N/A 09/25/2012   HRC:BULAGTXM atonic baggy esophagus status post Maloney dilation 68 F. Hiatal hernia   GIVENS CAPSULE STUDY N/A 01/15/2013   NORMAL.    IR GENERIC  HISTORICAL  03/17/2016   IR RADIOLOGIST EVAL & MGMT 03/17/2016 [facility]   LESION REMOVAL N/A 05/31/2015   Procedure: REMOVAL RIGHT AND LEFT LESIONS OF MANDIBLE;  Surgeon: Diona Browner, DDS;  Location: [facility];  Service: Oral Surgery;  Laterality: N/A;   MALONEY DILATION  12/29/2010   RMR;   NM MYOCAR PERF WALL MOTION  2006   "relavtiely normal" persantine, mild anterior thinning (breast attenuation artifact), no region of scar/ischemia   OVARIAN CYST REMOVAL     RECTOCELE REPAIR N/A 06/29/2015   Procedure: POSTERIOR REPAIR (RECTOCELE);  Surgeon: Jonnie Kind, MD;  Location: [facility];  Service: Gynecology;  Laterality: N/A;   REDUCTION MAMMAPLASTY Bilateral    SPINE SURGERY  09/29/2010   Dr. Rolena Infante   surgical excision of 3 tumors from right thigh and right buttock  and left  upper thigh  2010   TOOTH EXTRACTION Bilateral 12/14/2014   Procedure: REMOVAL OF BILATERAL MANDIBULAR EXOSTOSES;  Surgeon: Diona Browner, DDS;  Location: [facility];  Service: Oral Surgery;  Laterality: Bilateral;   TRANSTHORACIC ECHOCARDIOGRAM  2010   EF 60-65%, mild conc LVH, grade 1 diastolic dysfunction; mildly calcified MV annulus with mildly thickened leaflets, mildly calcified MR annulus     SOCIAL HISTORY:  Social History   Socioeconomic History   Marital status: Divorced    Spouse name: Not on file   Number of children: 1   Years of education: 12   Highest education level: High school graduate  Occupational History   Occupation: Disabled  Tobacco Use   Smoking status: Former    Packs/day: 0.25    Years: 30.00    Total pack years: 7.50    Types: Cigarettes    Quit date: 07/05/2021    Years since quitting: 0.5    Passive exposure: Past   Smokeless tobacco: Never   Tobacco comments:    3-4 a day MRC 05/17/2021  Vaping Use   Vaping Use: Never used  Substance and Sexual Activity   Alcohol use: No    Alcohol/week: 0.0 standard drinks of alcohol    Comment:     Drug use: No   Sexual activity: Not  Currently  Other Topics Concern   Not on file  Social History Narrative   01/29/18 Lives alone, has 3 aides, Mon- Fri 8 hrs, 2 hrs on Sat-Sun, RN manages her meds   Caffeine use: Drink coffee sometimes    Right handed    Social Determinants of Health   Financial Resource Strain: Low Risk  (01/03/2022)   Overall Financial Resource Strain (CARDIA)    Difficulty of Paying Living Expenses: Not very hard  Food Insecurity: No Food Insecurity (01/03/2022)   Hunger Vital Sign    Worried About Running Out of Food in the Last Year: Never true    [place] in the Last Year: Never true  Transportation Needs: No Transportation Needs (01/03/2022)   PRAPARE - Hydrologist (Medical): No    Lack of Transportation (Non-Medical): No  Physical Activity: Insufficiently Active (01/03/2022)   Exercise Vital Sign    Days of Exercise per Week: 2 days    Minutes of Exercise per Session: 20 min  Stress: No Stress Concern Present (01/03/2022)   [place]    Feeling of Stress : Only a little  Social Connections: Moderately Integrated (01/03/2022)   Social Connection and Isolation Panel [NHANES]    Frequency of Communication with Friends and Family: Three times a week    Frequency of Social Gatherings with Friends and Family: Once a week    Attends Religious Services: More than 4 times per year    Active Member of [employer] or Organizations: Yes    Attends Music therapist: More than 4 times per year    Marital Status: Divorced  Intimate Partner Violence: Not At Risk (05/21/2019)   Humiliation, Afraid, Rape, and Kick questionnaire    Fear of Current or Ex-Partner: No    Emotionally Abused: No    Physically Abused: No    Sexually Abused: No    FAMILY HISTORY:  Family History  Problem Relation Age of Onset   Heart attack Mother        HTN   Pneumonia Father    Kidney failure Father  Diabetes Father    Pancreatic cancer Sister    Cancer Sister        breast    Cancer Sister        pancreatic   Diabetes Brother    Hypertension Brother    Diabetes Brother    Alcohol abuse Maternal Uncle    Stroke Maternal Grandmother    Heart attack Maternal Grandfather    Hypertension Son    Sleep apnea Son    Colon cancer Neg Hx    Anesthesia problems Neg Hx    Hypotension Neg Hx    Malignant hyperthermia Neg Hx    Pseudochol deficiency Neg Hx    Breast cancer Neg Hx    Stomach cancer Neg Hx     CURRENT MEDICATIONS:  Outpatient Encounter Medications as of 01/27/2022  Medication Sig Note   ACCU-CHEK GUIDE test strip TEST BLOOD SUGAR FOUR TIMES DAILY  AND AS NEEDED    Accu-Chek Softclix Lancets lancets TEST BLOOD SUGAR THREE TIMES DAILY AS DIRECTED    acetaminophen (TYLENOL) 325 MG tablet Take 650 mg by mouth every 6 (six) hours as needed.    albuterol (PROVENTIL) (2.5 MG/3ML) 0.083% nebulizer solution Take 3 mLs (2.5 mg total) by nebulization every 6 (six) hours as needed for wheezing or shortness of breath.    albuterol (PROVENTIL) (2.5 MG/3ML) 0.083% nebulizer solution Take 3 mLs (2.5 mg total) by nebulization every 6 (six) hours as needed for wheezing or shortness of breath.    albuterol (VENTOLIN HFA) 108 (90 Base) MCG/ACT inhaler INHALE 1 TO 2 PUFFS EVERY 6 HOURS AS NEEDED FOR WHEEZING, SHORTNESS OF BREATH (Patient taking differently: Inhale 1-2 puffs into the lungs every 6 (six) hours as needed for wheezing or shortness of breath.)    Alcohol Swabs (DROPSAFE ALCOHOL PREP) 70 % PADS USE TO CLEAN FINGER PRIOR TO TESTING FOR BLOOD SUGAR  AS DIRECTED    alendronate (FOSAMAX) 70 MG tablet TAKE 1 TABLET EVERY 7 DAYS ON AN EMPTY STOMACH WITH A FULL GLASS OF WATER (Patient taking differently: Take 70 mg by mouth once a week.)    amLODipine (NORVASC) 10 MG tablet TAKE 1 TABLET EVERY DAY (Patient taking differently: Take 10 mg by mouth daily.)    ascorbic acid (VITAMIN C) 500 MG  tablet Take 500 mg by mouth daily.    aspirin EC 81 MG tablet Take 1 tablet (81 mg total) by mouth daily with breakfast.    Blood Glucose Calibration (ACCU-CHEK GUIDE CONTROL) LIQD USE AS DIRECTED    blood glucose meter kit and supplies Dispense based on patient and insurance preference. Use up to four times daily as directed. (FOR ICD-10 E10.9, E11.9).    buPROPion (WELLBUTRIN XL) 150 MG 24 hr tablet Take 1 tablet (150 mg total) by mouth every morning.    Calcium Carb-Cholecalciferol (CALTRATE 600+D3 SOFT) 600-20 MG-MCG CHEW Chew 1 tablet by mouth daily at 12 noon.    cetirizine (ZYRTEC) 10 MG tablet Take 1 tablet (10 mg total) by mouth daily.    Cholecalciferol (D3 PO) Take 1 tablet by mouth daily. 09/16/2021: Pt does not know how many units   Clobetasol Prop Emollient Base (CLOBETASOL PROPIONATE E) 0.05 % emollient cream Apply to affected area qd (Patient taking differently: Apply 1 Application topically daily.)    Continuous Blood Gluc Sensor (FREESTYLE LIBRE 14 DAY SENSOR) MISC 1 each by Does not apply route every 14 (fourteen) days. Change every 2 weeks    diclofenac Sodium (VOLTAREN) 1 % GEL  APPLY 2 GRAMS TOPICALLY FOUR TIMES DAILY. (Patient taking differently: Apply 2 g topically 4 (four) times daily.)    Docusate Sodium (DSS) 100 MG CAPS Take 1 tablet by mouth in the morning and at bedtime.    doxycycline (VIBRAMYCIN) 100 MG capsule Take 1 capsule (100 mg total) by mouth 2 (two) times daily.    DROPLET PEN NEEDLES 31G X 8 MM MISC USE FOR INJECTING INSULIN 4 TIMES DAILY.    ezetimibe (ZETIA) 10 MG tablet TAKE 1 TABLET EVERY DAY    furosemide (LASIX) 20 MG tablet Take 1 tablet (20 mg total) by mouth daily.    hydrocortisone (ANUSOL-HC) 2.5 % rectal cream Place 1 Application rectally 2 (two) times daily.    insulin aspart (NOVOLOG FLEXPEN) 100 UNIT/ML FlexPen Inject 5 Units into the skin 3 (three) times daily with meals. Give if eats 50% or more of meal. (Patient taking differently: Inject 14  Units into the skin 3 (three) times daily with meals. Give if eats 50% or more of meal.)    insulin glargine, 2 Unit Dial, (TOUJEO MAX SOLOSTAR) 300 UNIT/ML Solostar Pen Inject 20-25 Units into the skin daily.    ipratropium (ATROVENT) 0.03 % nasal spray Place 2 sprays into both nostrils every 12 (twelve) hours.    JARDIANCE 10 MG TABS tablet Take 10 mg by mouth daily.    lactobacillus acidophilus (BACID) TABS tablet Take 2 tablets by mouth 3 (three) times daily.    lamoTRIgine (LAMICTAL) 100 MG tablet Take 1 tablet (100 mg total) by mouth 2 (two) times daily.    levothyroxine (SYNTHROID) 50 MCG tablet TAKE 1 TABLET EVERY DAY FOR 6 DAYS PER WEEK, 1/2 TABLET 1 DAY PER WEEK    LORazepam (ATIVAN) 0.5 MG tablet Take 1 tablet (0.5 mg total) by mouth 2 (two) times daily.    LORazepam (ATIVAN) 1 MG tablet TAKE 1 TABLET BY MOUTH ONCE AS NEEDED FOR UP TO 1 DOSE FOR ANXIETY.    losartan (COZAAR) 50 MG tablet TAKE 1 TABLET EVERY DAY    meclizine (ANTIVERT) 25 MG tablet Take 1 tablet (25 mg total) by mouth 3 (three) times daily as needed for dizziness.    metoprolol tartrate (LOPRESSOR) 50 MG tablet TAKE 1 TABLET TWICE DAILY (NEED MD APPOINTMENT) (Patient taking differently: Take 50 mg by mouth 2 (two) times daily.)    Misc. Devices (MATTRESS PAD) MISC FIRM MATTRESS PAD for hospital bed x 1    montelukast (SINGULAIR) 10 MG tablet TAKE 1 TABLET EVERY DAY (Patient taking differently: Take 10 mg by mouth at bedtime.)    Multiple Vitamins-Minerals (MULTIVITAMIN GUMMIES ADULT PO) Take 1 tablet by mouth daily.    MYRBETRIQ 25 MG TB24 tablet TAKE 1 TABLET EVERY DAY    nystatin (MYCOSTATIN/NYSTOP) powder APPLY TO AFFECTED AREA 4 TIMES DAILY. (Patient taking differently: Apply 1 Application topically in the morning, at noon, in the evening, and at bedtime.)    Omega-3 Fatty Acids (FISH OIL PO) Take 1 capsule by mouth daily.    ondansetron (ZOFRAN) 4 MG tablet Take 1 tablet (4 mg total) by mouth every 8 (eight) hours as  needed for nausea or vomiting.    oxyCODONE-acetaminophen (PERCOCET) 10-325 MG tablet Take 1 tablet by mouth every 8 (eight) hours as needed for pain.    potassium chloride (KLOR-CON M) 10 MEQ tablet Take 1 tablet (10 mEq total) by mouth 2 (two) times daily.    potassium chloride (KLOR-CON) 10 MEQ tablet Take 10 mEq by mouth 2 (  two) times daily.    predniSONE (DELTASONE) 20 MG tablet Take 1 tablet (20 mg total) by mouth daily.    pregabalin (LYRICA) 75 MG capsule Take 1 capsule (75 mg total) by mouth daily.    RABEprazole (ACIPHEX) 20 MG tablet TAKE 1 TABLET TWICE DAILY    RESTASIS 0.05 % ophthalmic emulsion Place 2 drops into both eyes daily.    risperiDONE (RISPERDAL) 0.5 MG tablet Take 1 tablet (0.5 mg total) by mouth at bedtime.    rosuvastatin (CRESTOR) 5 MG tablet TAKE 1 TABLET AT BEDTIME.    sertraline (ZOLOFT) 100 MG tablet Take 1 tablet (100 mg total) by mouth every morning.    STIOLTO RESPIMAT 2.5-2.5 MCG/ACT AERS INHALE 2 PUFFS INTO THE LUNGS DAILY.    telmisartan (MICARDIS) 20 MG tablet Take 10 mg by mouth daily.    tizanidine (ZANAFLEX) 2 MG capsule Take 1 capsule (2 mg total) by mouth 3 (three) times daily. Do not drink alcohol or drive while taking this medication.  May cause drowsiness.    traZODone (DESYREL) 150 MG tablet Take 1 tablet (150 mg total) by mouth at bedtime.    vitamin E 180 MG (400 UNITS) capsule Take 400 Units by mouth daily.    No facility-administered encounter medications on file as of 01/27/2022.    ALLERGIES:  Allergies  Allergen Reactions   Iron Nausea And Vomiting    And itching And itching   Milk ([place]) Rash    Doesn't agree with stomach.  Doesn't agree with stomach.  Doesn't agree with stomach.    Penicillins Hives    Has patient had a PCN reaction causing immediate rash, facial/tongue/throat swelling, SOB or lightheadedness with hypotension: Yes Has patient had a PCN reaction causing severe rash involving mucus membranes or skin necrosis:  No Has patient had a PCN reaction that required hospitalization No Has patient had a PCN reaction occurring within the last 10 years: No If all of the above answers are "NO", then may proceed with Cephalosporin use.  Other reaction(s): Other (see comments) Has patient had a PCN reaction causing immediate rash, facial/tongue/throat swelling, SOB or lightheadedness with hypotension: Yes Has patient had a PCN reaction causing severe rash involving mucus membranes or skin necrosis: No Has patient had a PCN reaction that required hospitalization No Has patient had a PCN reaction occurring within the last 10 years: No If all of the above answers are "NO", then may proceed with Cephalosporin use. Has patient had a PCN reaction causing immediate rash, facial/tongue/throat swelling, SOB or lightheadedness with hypotension: Yes Has patient had a PCN reaction causing severe rash involving mucus membranes or skin necrosis: No Has patient had a PCN reaction that required hospitalization No Has patient had a PCN reaction occurring within the last 10 years: No If all of the above answers are "NO", then may proceed with Cephalosporin use. Has patient had a PCN reaction causing immediate rash, facial/tongue/throat swelling, SOB or lightheadedness with hypotension: Yes Has patient had a PCN reaction causing severe rash involving mucus membranes or skin necrosis: No Has patient had a PCN reaction that required hospitalization No Has patient had a PCN reaction occurring within the last 10 years: No If all of the above answers are "NO", then may proceed with Cephalosporin use. Has patient had a PCN reaction causing immediate rash, facial/tongue/throat swelling, SOB or lightheadedness with hypotension: Yes Has patient had a PCN reaction causing sever... (TRUNCATED)   Phenazopyridine Hives   Phenazopyridine Hcl Hives   Cephalexin  Hives   Flonase [Fluticasone]     "It gave me ulcers in my nose"   Milk-Related  Compounds Other (See Comments)    Doesn't agree with stomach.    Phenazopyridine Hcl Hives           PHYSICAL EXAM:    ECOG PERFORMANCE STATUS: 2 - Symptomatic, <50% confined to bed  There were no vitals filed for this visit. There were no vitals filed for this visit. Physical Exam Constitutional:      Appearance: Normal appearance. She is obese.     Comments: Appears somewhat somnolent during appointment, but rouses easily and participates appropriately in visit.  HENT:     Head: Normocephalic and atraumatic.     Mouth/Throat:     Mouth: Mucous membranes are moist.  Eyes:     Extraocular Movements: Extraocular movements intact.     Pupils: Pupils are equal, round, and reactive to light.  Cardiovascular:     Rate and Rhythm: Normal rate and regular rhythm.     Pulses: Normal pulses.     Heart sounds: Normal heart sounds.  Pulmonary:     Effort: Pulmonary effort is normal.     Breath sounds: Normal breath sounds.     Comments: Decreased breath sounds, auscultation limited by body habitus Abdominal:     General: Bowel sounds are normal.     Palpations: Abdomen is soft.     Tenderness: There is no abdominal tenderness.  Musculoskeletal:        General: No swelling.     Right lower leg: No edema.     Left lower leg: No edema.  Lymphadenopathy:     Cervical: No cervical adenopathy.  Skin:    General: Skin is warm and dry.  Neurological:     General: No focal deficit present.     Mental Status: She is alert and oriented to person, place, and time.     Motor: Weakness (Chronic right hand hemiparesis) present.  Psychiatric:        Mood and Affect: Mood normal.        Behavior: Behavior normal.      LABORATORY DATA:  I have reviewed the labs as listed.  CBC    Component Value Date/Time   WBC 6.0 01/10/2022 1255   RBC 4.59 01/10/2022 1255   HGB 10.3 (L) 01/10/2022 1255   HGB 10.3 (L) 11/20/2021 1118   HCT 36.0 01/10/2022 1255   HCT 35.4 11/20/2021 1118   PLT 221  01/10/2022 1255   PLT 305 11/20/2021 1118   MCV 78.4 (L) 01/10/2022 1255   MCV 78 (L) 11/20/2021 1118   MCH 22.4 (L) 01/10/2022 1255   MCHC 28.6 (L) 01/10/2022 1255   RDW 15.0 01/10/2022 1255   RDW 13.9 11/20/2021 1118   LYMPHSABS 2.1 11/20/2021 1118   MONOABS 0.7 10/12/2021 0932   EOSABS 0.4 11/20/2021 1118   BASOSABS 0.0 11/20/2021 1118      Latest Ref Rng & Units 12/23/2021   11:43 AM 11/20/2021   11:18 AM 11/09/2021   11:36 AM  CMP  Glucose 70 - 99 mg/dL 143  36  155   BUN 8 - 27 mg/dL _0 Creatinine 0.57 - 1.00 mg/dL 1.35  0.93  0.96   Sodium 134 - 144 mmol/L 144  146  141   Potassium 3.5 - 5.2 mmol/L 4.4  3.5  3.6   Chloride 96 - 106 mmol/L 106  109  107  CO2 20 - 29 mmol/L _0 Calcium 8.7 - 10.3 mg/dL 8.9  9.0  9.2   Total Protein 6.0 - 8.5 g/dL  6.6  7.4   Total Bilirubin 0.0 - 1.2 mg/dL  <0.2  0.6   Alkaline Phos 44 - 121 IU/L  80  67   AST 0 - 40 IU/L  19  22   ALT 0 - 32 IU/L  20  20     DIAGNOSTIC IMAGING:  I have independently reviewed the relevant imaging and discussed with the patient.  ASSESSMENT & PLAN: 1.  Microcytic anemia - This is a combination anemia from functional iron deficiency (in the setting of chronic disease including COPD, type 2 diabetes, fibromyalgia, hypertension, and multiple other comorbidities listed in history) and chronic kidney disease stage III. - She has chronic microcytosis - SPEP checked in 2019 was normal. - Hemoccult stool positive in 2014, negative in 2015 - Colonoscopy (11/04/2020): Multiple diverticula, otherwise normal - EGD (11/04/2020): Normal esophagus, gastritis, gastric lipoma - She was previously placed on iron pills, but could not tolerate them due to constipation. - Most recent IV iron Venofer 300 mg x 2 from 08/16/2021 through 08/29/2021 (requires PREMEDICATION with steroids and Zofran due to episode of mild swelling, itching, and nausea after Feraheme in October 2020) - She has been on Procrit  10,000 units every 2 weeks since 11/02/2021 - She denies any bleeding per rectum or melena, but does have occasional dark stools  - Symptomatic with significant fatigue which is chronic and unchanged  - Labs today (01/27/2022): Hgb 10.6, creatinine 1.10/GFR 54.  Ferritin 185.  Iron saturation 19%..  Normal folate, copper, vitamin B12 when checked in April 2023. - Hemoccult stool x3 ordered in August 2023, but not returned by patient.  Patient declines to complete this test because it "grosses her out." - PLAN: CBC + Retacrit 10,000 units every 3 weeks (decreased frequency from every other week)  - Repeat CBC, CMP, ferritin, and iron/TIBC with same-day office visit in 3 months - She follows with Dr. Theador Hawthorne for her CKD stage IIIa   PLAN SUMMARY: >> CBC + Retacrit every 3 weeks >> Labs in 3 months (CBC/D, CMP, ferritin, iron/TIBC) >> Office visit in 3 months (same day as labs/injection)  All questions were answered. The patient knows to call the clinic with any problems, questions or concerns.  Medical decision making: Low  Time spent on visit: I spent 15 minutes counseling the patient face to face. The total time spent in the appointment was 22 minutes and more than 50% was on counseling.   Harriett Rush, PA-C  01/27/2022 1:29 PM

## 2022-01-26 NOTE — Telephone Encounter (Signed)
Patient aware and will also bring medications to visit on 01/31/22

## 2022-01-27 ENCOUNTER — Inpatient Hospital Stay: Payer: Medicare HMO

## 2022-01-27 ENCOUNTER — Inpatient Hospital Stay (HOSPITAL_BASED_OUTPATIENT_CLINIC_OR_DEPARTMENT_OTHER): Payer: Medicare HMO | Admitting: Physician Assistant

## 2022-01-27 VITALS — BP 133/60 | HR 60 | Temp 98.9°F | Resp 16 | Wt 166.2 lb

## 2022-01-27 DIAGNOSIS — N1832 Chronic kidney disease, stage 3b: Secondary | ICD-10-CM

## 2022-01-27 DIAGNOSIS — D631 Anemia in chronic kidney disease: Secondary | ICD-10-CM

## 2022-01-27 DIAGNOSIS — I129 Hypertensive chronic kidney disease with stage 1 through stage 4 chronic kidney disease, or unspecified chronic kidney disease: Secondary | ICD-10-CM | POA: Diagnosis not present

## 2022-01-27 DIAGNOSIS — M810 Age-related osteoporosis without current pathological fracture: Secondary | ICD-10-CM | POA: Diagnosis not present

## 2022-01-27 DIAGNOSIS — D638 Anemia in other chronic diseases classified elsewhere: Secondary | ICD-10-CM

## 2022-01-27 DIAGNOSIS — D509 Iron deficiency anemia, unspecified: Secondary | ICD-10-CM | POA: Diagnosis not present

## 2022-01-27 DIAGNOSIS — Z87891 Personal history of nicotine dependence: Secondary | ICD-10-CM | POA: Diagnosis not present

## 2022-01-27 DIAGNOSIS — Z79899 Other long term (current) drug therapy: Secondary | ICD-10-CM | POA: Diagnosis not present

## 2022-01-27 LAB — COMPREHENSIVE METABOLIC PANEL
ALT: 24 U/L (ref 0–44)
AST: 23 U/L (ref 15–41)
Albumin: 3.8 g/dL (ref 3.5–5.0)
Alkaline Phosphatase: 74 U/L (ref 38–126)
Anion gap: 7 (ref 5–15)
BUN: 22 mg/dL (ref 8–23)
CO2: 26 mmol/L (ref 22–32)
Calcium: 9.8 mg/dL (ref 8.9–10.3)
Chloride: 108 mmol/L (ref 98–111)
Creatinine, Ser: 1.1 mg/dL — ABNORMAL HIGH (ref 0.44–1.00)
GFR, Estimated: 54 mL/min — ABNORMAL LOW (ref >60)
Glucose, Bld: 100 mg/dL — ABNORMAL HIGH (ref 70–99)
Potassium: 4.2 mmol/L (ref 3.5–5.1)
Sodium: 141 mmol/L (ref 135–145)
Total Bilirubin: 0.5 mg/dL (ref 0.3–1.2)
Total Protein: 6.9 g/dL (ref 6.5–8.1)

## 2022-01-27 LAB — CBC WITH DIFFERENTIAL/PLATELET
Abs Immature Granulocytes: 0.04 10*3/uL (ref 0.00–0.07)
Basophils Absolute: 0 10*3/uL (ref 0.0–0.1)
Basophils Relative: 1 %
Eosinophils Absolute: 0.2 10*3/uL (ref 0.0–0.5)
Eosinophils Relative: 4 %
HCT: 36.6 % (ref 36.0–46.0)
Hemoglobin: 10.6 g/dL — ABNORMAL LOW (ref 12.0–15.0)
Immature Granulocytes: 1 %
Lymphocytes Relative: 27 %
Lymphs Abs: 1.8 10*3/uL (ref 0.7–4.0)
MCH: 22.4 pg — ABNORMAL LOW (ref 26.0–34.0)
MCHC: 29 g/dL — ABNORMAL LOW (ref 30.0–36.0)
MCV: 77.4 fL — ABNORMAL LOW (ref 80.0–100.0)
Monocytes Absolute: 0.7 10*3/uL (ref 0.1–1.0)
Monocytes Relative: 11 %
Neutro Abs: 3.7 10*3/uL (ref 1.7–7.7)
Neutrophils Relative %: 56 %
Platelets: 244 10*3/uL (ref 150–400)
RBC: 4.73 MIL/uL (ref 3.87–5.11)
RDW: 15.5 % (ref 11.5–15.5)
WBC: 6.4 10*3/uL (ref 4.0–10.5)
nRBC: 0 % (ref 0.0–0.2)

## 2022-01-27 LAB — IRON AND TIBC
Iron: 63 ug/dL (ref 28–170)
Saturation Ratios: 19 % (ref 10.4–31.8)
TIBC: 327 ug/dL (ref 250–450)
UIBC: 264 ug/dL

## 2022-01-27 LAB — FERRITIN: Ferritin: 185 ng/mL (ref 11–307)

## 2022-01-27 MED ORDER — EPOETIN ALFA 10000 UNIT/ML IJ SOLN
10000.0000 [IU] | Freq: Once | INTRAMUSCULAR | Status: AC
  Administered 2022-01-27: 10000 [IU] via SUBCUTANEOUS
  Filled 2022-01-27: qty 1

## 2022-01-27 NOTE — Patient Instructions (Addendum)
[facility]  Discharge Instructions: Thank you for choosing [facility] to provide your oncology and hematology care.  If you have a lab appointment with the [facility], please come in thru the Main Entrance and check in at the main information desk.  Wear comfortable clothing and clothing appropriate for easy access to any Portacath or PICC line.   We strive to give you quality time with your provider. You may need to reschedule your appointment if you arrive late (15 or more minutes).  Arriving late affects you and other patients whose appointments are after yours.  Also, if you miss three or more appointments without notifying the office, you may be dismissed from the clinic at the provider's discretion.      For prescription refill requests, have your pharmacy contact our office and allow 72 hours for refills to be completed.    Today you received the following chemotherapy and/or immunotherapy agents Epogen      To help prevent nausea and vomiting after your treatment, we encourage you to take your nausea medication as directed.  BELOW ARE SYMPTOMS THAT SHOULD BE REPORTED IMMEDIATELY: *FEVER GREATER THAN 100.4 F (38 C) OR HIGHER *CHILLS OR SWEATING *NAUSEA AND VOMITING THAT IS NOT CONTROLLED WITH YOUR NAUSEA MEDICATION *UNUSUAL SHORTNESS OF BREATH *UNUSUAL BRUISING OR BLEEDING *URINARY PROBLEMS (pain or burning when urinating, or frequent urination) *BOWEL PROBLEMS (unusual diarrhea, constipation, pain near the anus) TENDERNESS IN MOUTH AND THROAT WITH OR WITHOUT PRESENCE OF ULCERS (sore throat, sores in mouth, or a toothache) UNUSUAL RASH, SWELLING OR PAIN  UNUSUAL VAGINAL DISCHARGE OR ITCHING   Items with * indicate a potential emergency and should be followed up as soon as possible or go to the Emergency Department if any problems should occur.  Please show the CHEMOTHERAPY ALERT CARD or IMMUNOTHERAPY ALERT CARD at check-in to the Emergency  Department and triage nurse.  Should you have questions after your visit or need to cancel or reschedule your appointment, please contact [facility] [phone]  and follow the prompts.  Office hours are 8:00 a.m. to 4:30 p.m. Monday - Friday. Please note that voicemails left after 4:00 p.m. may not be returned until the following business day.  We are closed weekends and major holidays. You have access to a nurse at all times for urgent questions. Please call the main number to the clinic [phone] and follow the prompts.  For any non-urgent questions, you may also contact your provider using MyChart. We now offer e-Visits for anyone 8 and older to request care online for non-urgent symptoms. For details visit [email or web address].   Also download the MyChart app! Go to the app store, search "MyChart", open the app, select [facility], and log in with your MyChart username and password.  Masks are optional in the cancer centers. If you would like for your care team to wear a mask while they are taking care of you, please let them know. You may have one support person who is at least 71 years old accompany you for your appointments.

## 2022-01-27 NOTE — Progress Notes (Signed)
[name] presents today for injection per the provider's orders.  Epogen administration without incident; injection site WNL; see MAR for injection details.  Patient tolerated procedure well and without incident.  No questions or complaints noted at this time.

## 2022-01-27 NOTE — Telephone Encounter (Signed)
Inlab study has been approved.  Left vm for sleep lab to call me to schedule.

## 2022-01-27 NOTE — Patient Instructions (Signed)
[name] at [facility] Discharge Instructions  You were seen today by Tarri Abernethy PA-C for your anemia.  This is most likely related to your underlying chronic kidney disease.   Your blood counts are looking better, so we will decrease the frequency of your Retacrit shots to every 3 weeks (instead of every 2 weeks).  We will continue to check your blood count every 3 weeks on the same day as your injections.  MEDICATIONS: Retacrit injection every 3 weeks  FOLLOW-UP APPOINTMENT: Office visit in 3 months   Thank you for choosing [facility] at [facility] to provide your oncology and hematology care.  To afford each patient quality time with our provider, please arrive at least 15 minutes before your scheduled appointment time.   If you have a lab appointment with the [facility] please come in thru the Main Entrance and check in at the main information desk.  You need to re-schedule your appointment should you arrive 10 or more minutes late.  We strive to give you quality time with our providers, and arriving late affects you and other patients whose appointments are after yours.  Also, if you no show three or more times for appointments you may be dismissed from the clinic at the providers discretion.     Again, thank you for choosing [facility].  Our hope is that these requests will decrease the amount of time that you wait before being seen by our physicians.       _____________________________________________________________  Should you have questions after your visit to [facility], please contact our office at [phone] and follow the prompts.  Our office hours are 8:00 a.m. and 4:30 p.m. Monday - Friday.  Please note that voicemails left after 4:00 p.m. may not be returned until the following business day.  We are closed weekends and major holidays.  You do have access to a nurse 24-7, just call the  main number to the clinic [phone] and do not press any options, hold on the line and a nurse will answer the phone.    For prescription refill requests, have your pharmacy contact our office and allow 72 hours.

## 2022-01-30 ENCOUNTER — Other Ambulatory Visit: Payer: Self-pay | Admitting: Internal Medicine

## 2022-01-30 ENCOUNTER — Other Ambulatory Visit: Payer: Self-pay

## 2022-01-30 DIAGNOSIS — D509 Iron deficiency anemia, unspecified: Secondary | ICD-10-CM

## 2022-01-30 DIAGNOSIS — E039 Hypothyroidism, unspecified: Secondary | ICD-10-CM

## 2022-01-30 DIAGNOSIS — D638 Anemia in other chronic diseases classified elsewhere: Secondary | ICD-10-CM

## 2022-01-30 DIAGNOSIS — D631 Anemia in chronic kidney disease: Secondary | ICD-10-CM

## 2022-01-31 ENCOUNTER — Encounter: Payer: Self-pay | Admitting: Family Medicine

## 2022-01-31 ENCOUNTER — Ambulatory Visit (INDEPENDENT_AMBULATORY_CARE_PROVIDER_SITE_OTHER): Payer: Medicare HMO | Admitting: Family Medicine

## 2022-01-31 VITALS — BP 127/68 | HR 65 | Ht 62.0 in | Wt 167.1 lb

## 2022-01-31 DIAGNOSIS — F17210 Nicotine dependence, cigarettes, uncomplicated: Secondary | ICD-10-CM | POA: Diagnosis not present

## 2022-01-31 DIAGNOSIS — Z7189 Other specified counseling: Secondary | ICD-10-CM | POA: Insufficient documentation

## 2022-01-31 DIAGNOSIS — I10 Essential (primary) hypertension: Secondary | ICD-10-CM | POA: Diagnosis not present

## 2022-01-31 DIAGNOSIS — M4716 Other spondylosis with myelopathy, lumbar region: Secondary | ICD-10-CM | POA: Diagnosis not present

## 2022-01-31 NOTE — Assessment & Plan Note (Signed)
Controlled, no change in medication DASH diet and commitment to daily physical activity for a minimum of 30 minutes discussed and encouraged, as a part of hypertension management. The importance of attaining a healthy weight is also discussed.     01/31/2022   10:35 AM 01/27/2022   10:15 AM 01/23/2022   10:24 AM 01/16/2022    1:26 PM 01/12/2022   11:06 AM 12/29/2021    2:13 PM 12/20/2021   11:19 AM  BP/Weight  Systolic BP 938 182 993 716 967 893 810  Diastolic BP 68 60 82 62 67 57 72  Wt. (Lbs) 167.08 166.23 165.4 170.6   168  BMI 30.56 kg/m2 30.4 kg/m2 30.25 kg/m2 31.2 kg/m2   33.93 kg/m2

## 2022-01-31 NOTE — Progress Notes (Signed)
   [name]     MRN: [id number]      DOB: 29-Oct-1950   HPI [name] is here for visual review of her medications and med reconciliation, also for re evaluation of blood pressure Requests handicap sticker, she has severe arthritis and is a high fall risk  ROS Denies recent fever or chills. Denies sinus pressure, nasal congestion, ear pain or sore throat. Denies chest congestion, productive cough or wheezing. Denies chest pains, palpitations and leg swelling Denies abdominal pain, nausea, vomiting,diarrhea or constipation.   Denies dysuria, frequency, hesitancy or incontinence. . Denies headaches, seizures, Chronic  depression, anxiety or insomnia. Denies skin break down or rash.   PE  BP 127/68 (BP Location: Right Arm, Patient Position: Sitting, Cuff Size: Normal)   Pulse 65   Ht '5\' 2"'$  (1.575 m)   Wt 167 lb 1.3 oz (75.8 kg)   SpO2 93%   BMI 30.56 kg/m   Patient alert and oriented and in no cardiopulmonary distress.  HEENT: No facial asymmetry, EOMI,     Neck supple .  Chest: Clear to auscultation bilaterally.Decreased air entry  CVS: S1, S2 no murmurs, no S3.Regular rate.  ABD: Soft non tender.   Ext: No edema  MS: decreased  ROM spine, shoulders, hips and knees.  Skin: Intact, no ulcerations or rash noted.  Psych: Good eye contact, normal affect. Memory intact not anxious or depressed appearing.  CNS: CN 2-12 intact, power,  normal throughout.no focal deficits noted.   Assessment & Plan  Encounter for medication review and counseling Pt brought in medications and at least 10 of her medications were replicated with some of  the bottles almost full dispensed 2 , 3 6 and even 11 months prior. A REAL issue is despite the fact that she has nurse visits to arrange her meds, this is not being effectively done. Alsoher mail order pharmacy is sending refills after 60 days when a 90 day quantity was till effective. Medication needs to be changed to local pharmacy with  pill packing.  Nurse to arrange this.The excess med bottles have been placed in a bag for dispoaal, accompanying sitter understands  Essential hypertension Controlled, no change in medication DASH diet and commitment to daily physical activity for a minimum of 30 minutes discussed and encouraged, as a part of hypertension management. The importance of attaining a healthy weight is also discussed.     01/31/2022   10:35 AM 01/27/2022   10:15 AM 01/23/2022   10:24 AM 01/16/2022    1:26 PM 01/12/2022   11:06 AM 12/29/2021    2:13 PM 12/20/2021   11:19 AM  BP/Weight  Systolic BP 202 542 706 237 628 315 176  Diastolic BP 68 60 82 62 67 57 72  Wt. (Lbs) 167.08 166.23 165.4 170.6   168  BMI 30.56 kg/m2 30.4 kg/m2 30.25 kg/m2 31.2 kg/m2   33.93 kg/m2       Episodic cigarette smoking dependence Asked:confirms currently smokes cigarettes occasionally, trying to quit Assess: Unwilling to set a quit date, but is cutting back Advise: needs to QUIT to reduce risk of cancer, cardio and cerebrovascular disease Assist: counseled for 5 minutes and literature provided Arrange: follow up in 2 to 4 months   Lumbar spondylosis with myelopathy Severe arthritis and high fall risk, handicap parking sticker provided

## 2022-01-31 NOTE — Assessment & Plan Note (Signed)
Asked:confirms currently smokes cigarettes occasionally, trying to quit Assess: Unwilling to set a quit date, but is cutting back Advise: needs to QUIT to reduce risk of cancer, cardio and cerebrovascular disease Assist: counseled for 5 minutes and literature provided Arrange: follow up in 2 to 4 months

## 2022-01-31 NOTE — Patient Instructions (Signed)
Follow-up in January, if you need me sooner.  Please schedule this as an annual physical exam if this is due.  Your medications have been sorted  and duplicates of the same medication are to be discarded safely.you may put them in [place] in a sealed container and discard of them in your regular garbage this way  Handicap sticker to be provided at visit.  Be careful not to fall and all the best for 2024!

## 2022-01-31 NOTE — Assessment & Plan Note (Signed)
Pt brought in medications and at least 10 of her medications were replicated with some of  the bottles almost full dispensed 2 , 3 6 and even 11 months prior. A REAL issue is despite the fact that she has nurse visits to arrange her meds, this is not being effectively done. Alsoher mail order pharmacy is sending refills after 60 days when a 90 day quantity was till effective. Medication needs to be changed to local pharmacy with pill packing.  Nurse to arrange this.The excess med bottles have been placed in a bag for dispoaal, accompanying sitter understands

## 2022-01-31 NOTE — Assessment & Plan Note (Signed)
Severe arthritis and high fall risk, handicap parking sticker provided

## 2022-02-01 ENCOUNTER — Other Ambulatory Visit: Payer: Self-pay | Admitting: Family Medicine

## 2022-02-01 ENCOUNTER — Ambulatory Visit: Payer: Medicare HMO

## 2022-02-01 ENCOUNTER — Other Ambulatory Visit (HOSPITAL_COMMUNITY): Payer: Self-pay

## 2022-02-01 DIAGNOSIS — F250 Schizoaffective disorder, bipolar type: Secondary | ICD-10-CM | POA: Diagnosis not present

## 2022-02-01 DIAGNOSIS — Z7982 Long term (current) use of aspirin: Secondary | ICD-10-CM

## 2022-02-01 DIAGNOSIS — E039 Hypothyroidism, unspecified: Secondary | ICD-10-CM

## 2022-02-01 DIAGNOSIS — F39 Unspecified mood [affective] disorder: Secondary | ICD-10-CM

## 2022-02-01 DIAGNOSIS — D631 Anemia in chronic kidney disease: Secondary | ICD-10-CM | POA: Diagnosis not present

## 2022-02-01 DIAGNOSIS — J449 Chronic obstructive pulmonary disease, unspecified: Secondary | ICD-10-CM | POA: Diagnosis not present

## 2022-02-01 DIAGNOSIS — M150 Primary generalized (osteo)arthritis: Secondary | ICD-10-CM

## 2022-02-01 DIAGNOSIS — K219 Gastro-esophageal reflux disease without esophagitis: Secondary | ICD-10-CM

## 2022-02-01 DIAGNOSIS — E785 Hyperlipidemia, unspecified: Secondary | ICD-10-CM

## 2022-02-01 DIAGNOSIS — N1832 Chronic kidney disease, stage 3b: Secondary | ICD-10-CM | POA: Diagnosis not present

## 2022-02-01 DIAGNOSIS — I130 Hypertensive heart and chronic kidney disease with heart failure and stage 1 through stage 4 chronic kidney disease, or unspecified chronic kidney disease: Secondary | ICD-10-CM | POA: Diagnosis not present

## 2022-02-01 DIAGNOSIS — Z8673 Personal history of transient ischemic attack (TIA), and cerebral infarction without residual deficits: Secondary | ICD-10-CM

## 2022-02-01 DIAGNOSIS — K589 Irritable bowel syndrome without diarrhea: Secondary | ICD-10-CM

## 2022-02-01 DIAGNOSIS — I509 Heart failure, unspecified: Secondary | ICD-10-CM | POA: Diagnosis not present

## 2022-02-01 DIAGNOSIS — K5900 Constipation, unspecified: Secondary | ICD-10-CM

## 2022-02-01 DIAGNOSIS — E1122 Type 2 diabetes mellitus with diabetic chronic kidney disease: Secondary | ICD-10-CM | POA: Diagnosis not present

## 2022-02-01 DIAGNOSIS — E1142 Type 2 diabetes mellitus with diabetic polyneuropathy: Secondary | ICD-10-CM | POA: Diagnosis not present

## 2022-02-01 DIAGNOSIS — F419 Anxiety disorder, unspecified: Secondary | ICD-10-CM

## 2022-02-01 DIAGNOSIS — M810 Age-related osteoporosis without current pathological fracture: Secondary | ICD-10-CM

## 2022-02-01 DIAGNOSIS — Z79891 Long term (current) use of opiate analgesic: Secondary | ICD-10-CM

## 2022-02-01 DIAGNOSIS — F32A Depression, unspecified: Secondary | ICD-10-CM

## 2022-02-01 DIAGNOSIS — Z794 Long term (current) use of insulin: Secondary | ICD-10-CM | POA: Diagnosis not present

## 2022-02-01 MED ORDER — POTASSIUM CHLORIDE ER 10 MEQ PO TBCR
10.0000 meq | EXTENDED_RELEASE_TABLET | Freq: Two times a day (BID) | ORAL | 2 refills | Status: DC
  Filled 2022-02-01: qty 30, 15d supply, fill #0

## 2022-02-06 IMAGING — CT CT CHEST LUNG CANCER SCREENING LOW DOSE W/O CM
1 series · 15 of 31 positions shown, 19 images · non-contrast
Comparison: 01/16/2019

CLINICAL DATA: Lung cancer screening. Current smoker. Thirty-three
pack-year history. Asymptomatic.

EXAM:
CT CHEST WITHOUT CONTRAST LOW-DOSE FOR LUNG CANCER SCREENING
TECHNIQUE: Multidetector CT imaging of the chest was performed following the
standard protocol without IV contrast.

[Series 2: ldct screening <30 bmi · axial · 0.78mm/px · z∈[-280,-25]mm · 15 of 57 slices shown, 19 images]
[im 3/57  mediastinal]
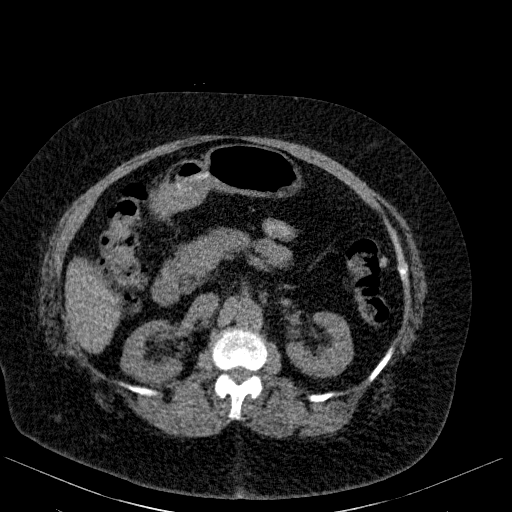
[im 3/57  lung]
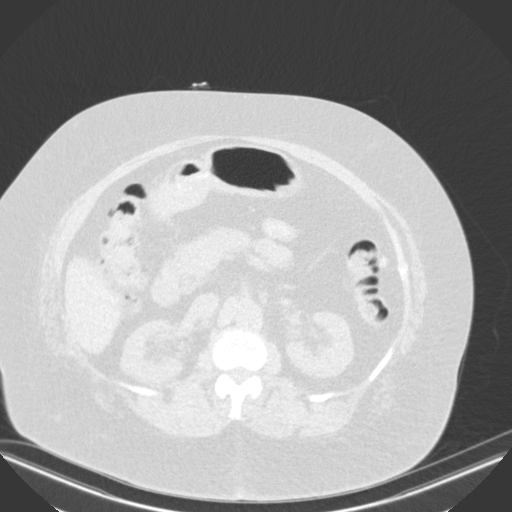
[im 7/57  lung]
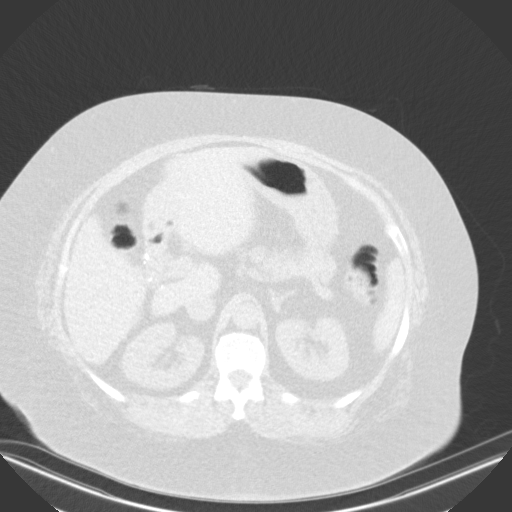
[im 11/57  lung]
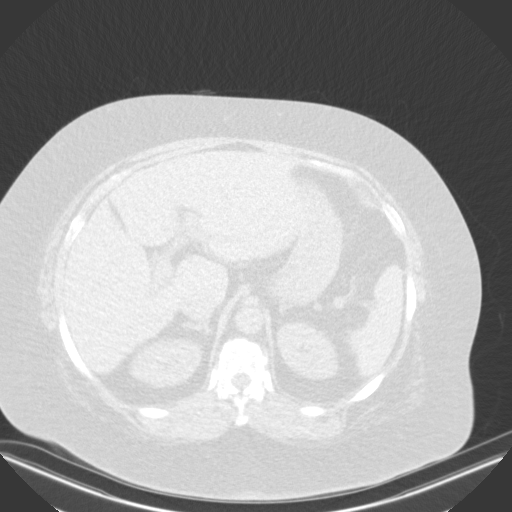
[im 13/57  lung]
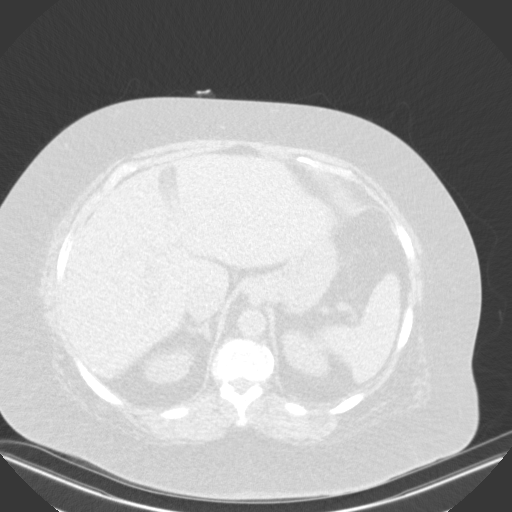
[im 17/57  mediastinal]
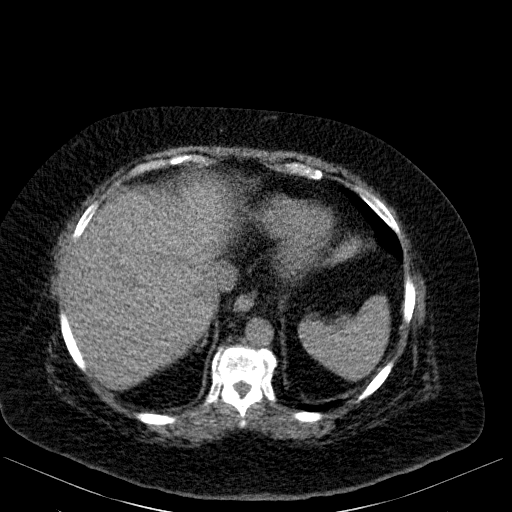
[im 17/57  lung]
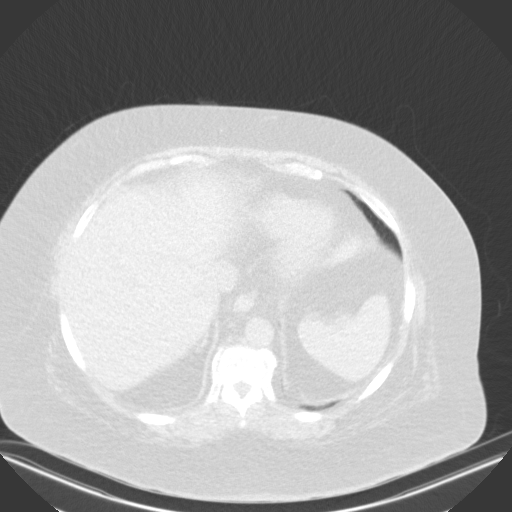
[im 21/57  lung]
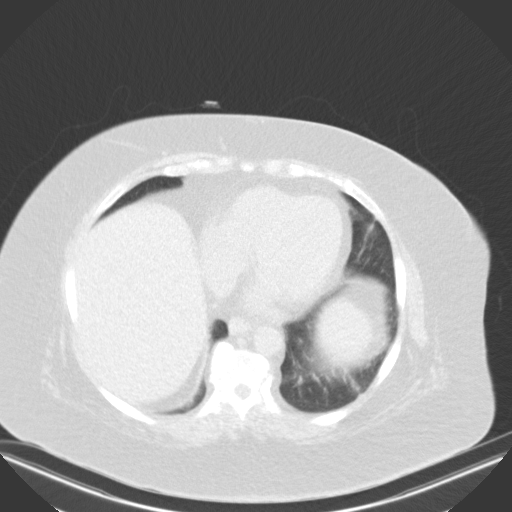
[im 25/57  lung]
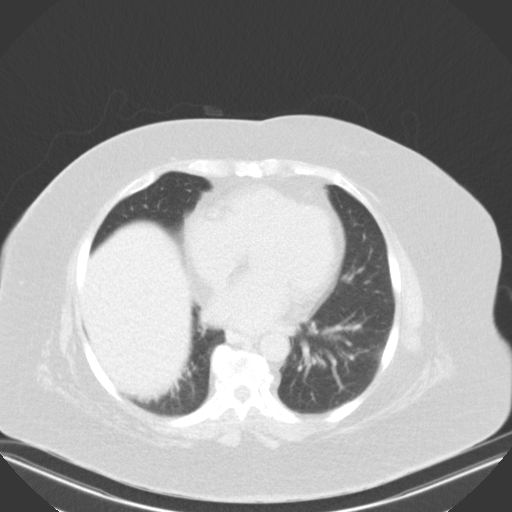
[im 30/57  lung]
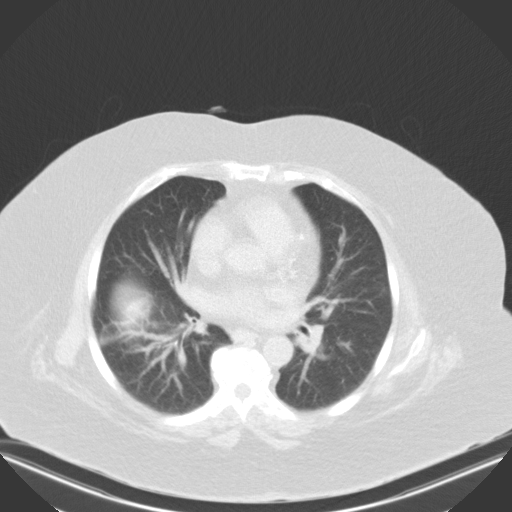
[im 32/57  mediastinal]
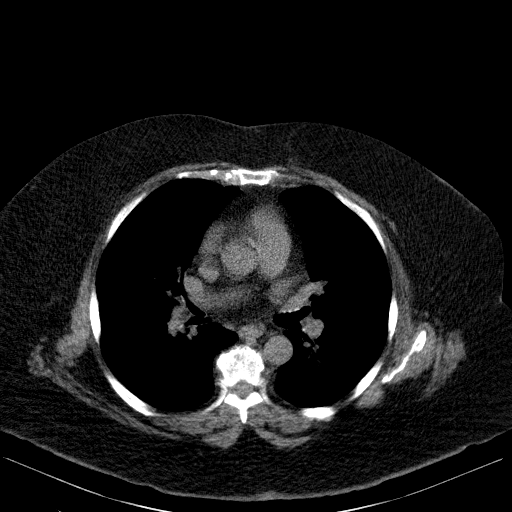
[im 32/57  lung]
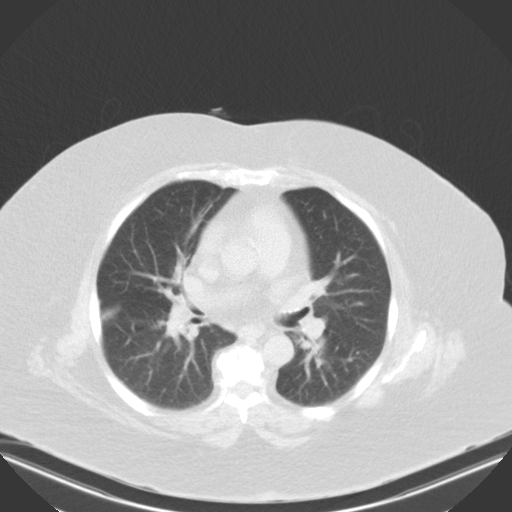
[im 36/57  lung]
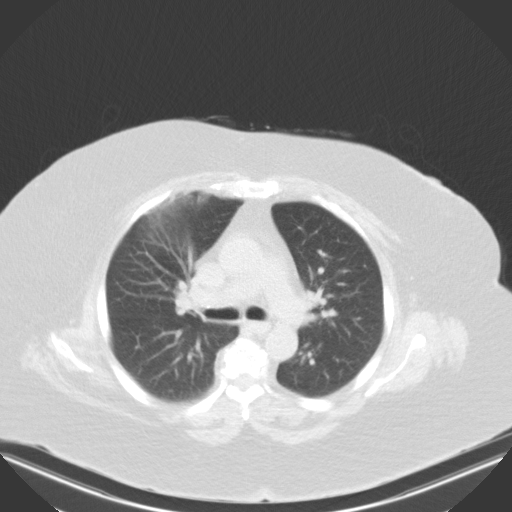
[im 40/57  lung]
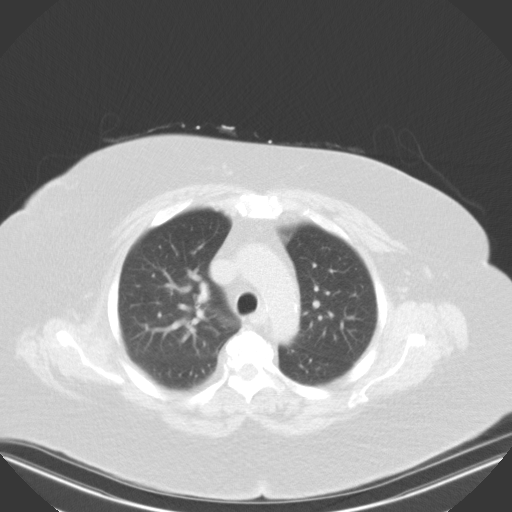
[im 44/57  lung]
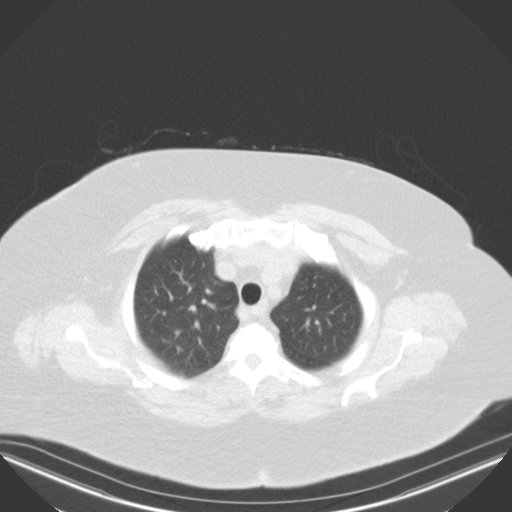
[im 46/57  mediastinal]
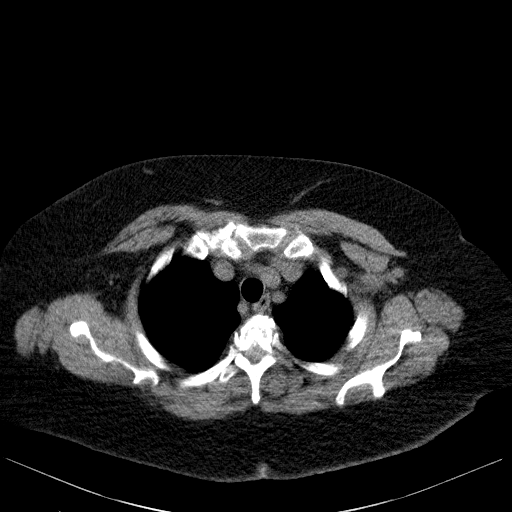
[im 46/57  lung]
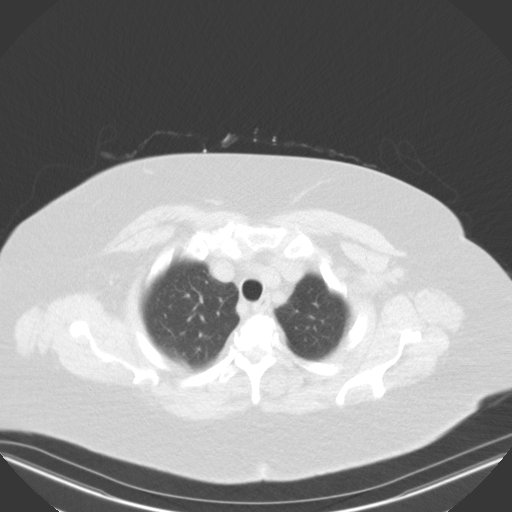
[im 50/57  lung]
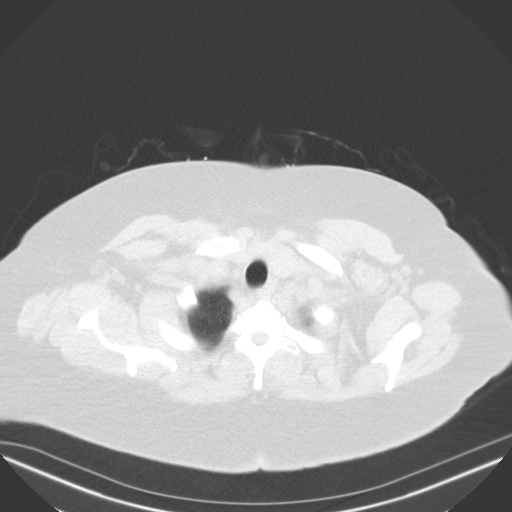
[im 54/57  lung]
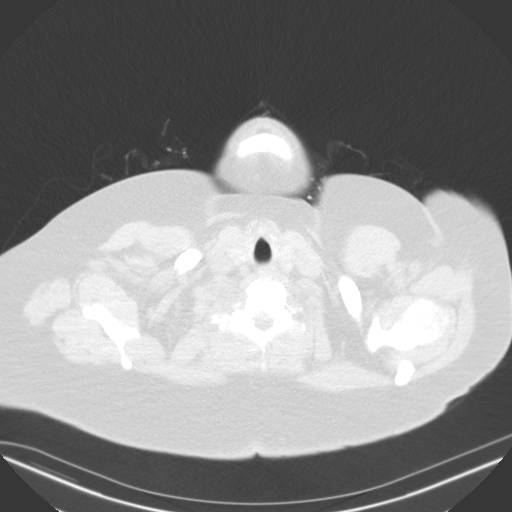

[15 of 31 positions shown; findings below may reference images not displayed]

FINDINGS: Cardiovascular: Normal heart size. Aortic atherosclerosis. Coronary
artery atherosclerotic calcifications.

Mediastinum/Nodes: No enlarged mediastinal, hilar, or axillary lymph
nodes. Thyroid gland, trachea, and esophagus demonstrate no
significant findings.

Lungs/Pleura: Mild centrilobular and paraseptal emphysema. Scattered
areas of parenchymal scarring noted. Nodular area of scarring within
the lingula appears unchanged from 01/16/2019, image 186/3.
Asymmetric elevation of the right hemidiaphragm is again noted. No
pleural effusion, airspace consolidation or atelectasis. Calcified
granuloma noted within the right upper lobe. No suspicious lung
nodules identified at this time.

Upper Abdomen: No acute abnormality.  Previous cholecystectomy.

Musculoskeletal: Degenerative disc disease identified within the
thoracic spine. No acute or suspicious osseous findings.
IMPRESSION: 1. Lung-RADS 1, negative. Continue annual screening with low-dose
chest CT without contrast in 12 months.
2. Coronary artery calcifications.

Aortic Atherosclerosis (X5GK5-2TD.D) and Emphysema (X5GK5-KYA.J).

## 2022-02-07 ENCOUNTER — Ambulatory Visit: Payer: Medicare HMO | Admitting: Family Medicine

## 2022-02-07 NOTE — Telephone Encounter (Signed)
This has been scheduled and appt info given to pt.

## 2022-02-08 DIAGNOSIS — M19112 Post-traumatic osteoarthritis, left shoulder: Secondary | ICD-10-CM | POA: Diagnosis not present

## 2022-02-09 ENCOUNTER — Ambulatory Visit (INDEPENDENT_AMBULATORY_CARE_PROVIDER_SITE_OTHER): Payer: Medicare HMO | Admitting: *Deleted

## 2022-02-09 DIAGNOSIS — E1159 Type 2 diabetes mellitus with other circulatory complications: Secondary | ICD-10-CM | POA: Diagnosis not present

## 2022-02-09 DIAGNOSIS — I10 Essential (primary) hypertension: Secondary | ICD-10-CM

## 2022-02-09 DIAGNOSIS — E1143 Type 2 diabetes mellitus with diabetic autonomic (poly)neuropathy: Secondary | ICD-10-CM

## 2022-02-09 NOTE — Patient Instructions (Signed)
Please call the care guide team at [phone] if you need to cancel or reschedule your appointment.   If you are experiencing a Mental Health or [place] or need someone to talk to, please call the Suicide and Crisis Lifeline: 988 call the [place] National Suicide Prevention Lifeline: 614-875-6135 or TTY: 618-089-6586 TTY 443-048-3111) to talk to a trained counselor call 1-800-273-TALK (toll free, 24 hour hotline) go to [facility] [address] [place] [phone]) call the [facility]: [phone] call 911   Following is a copy of your full provider care plan:   Goals Addressed             This Visit's Progress    CCM (DIABETES) EXPECTED OUTCOME: MONITOR, SELF-MANAGE AND REDUCE SYMPTOMS OF DIABETES       Current Barriers:  Knowledge Deficits related to Diabetes Chronic Disease Management support and education needs related to management of Diabetes, diet Patient reports she lives alone, has CAP aide 8 hours per day/ 7 days per week and she provides transportation as well as church members, patient reports she checks CBG QID with fasting ranges low 100's recently (states no low readings over the past few weeks) and has Freestyle [place] but has been unable to use, stating " I don't understanding how this works"  pt states she took [employer] with her to appointment in November to see endocrinologist, pt states " this may not be the thing for me", pt states the receiver did ship (she thinks it did as she is not sure what the receiver looks like) , pt states she did not call for follow up with endocrinologist as instructed and take her phone so she can be shown how to use once receiver arrived.  Patient states her aide is not present, RN care manager would like to speak with aide about the [facility] and make sure the receiver has been shipped.  Patient reports she has had no recent falls since August 2023.  Patient states nurse from [facility] comes in q 2 weeks to prefill medications and assist with managing medications, pt reports she is taking as prescribed, RN care manager would like to speak with aide and home health nurse about medications, pt verbalizes understanding and agreeable to phone call tomorrow when aide is present.  Pt has needed DME - WC, cane, walker, hospital bed.  Planned Interventions: Provided education to patient about basic DM disease process; Reviewed medications with patient and discussed importance of medication adherence;        Reviewed prescribed diet with patient carbohydrate modified; Discussed plans with patient for ongoing care management follow up and provided patient with direct contact information for care management team;      Review of patient status, including review of consultants reports, relevant laboratory and other test results, and medications completed;       Screening for signs and symptoms of depression related to chronic disease state;        Assessed social determinant of health barriers;        Ask patient to take [facility] receiver and her phone to her endocrinologist for education Telephone call to [facility], spoke with [facility] and requested to speak with [facility] RN that sees pt q 2 weeks, Katelyn states RN assigned to her case will call RN care manager back Message sent to [name] with update Reviewed plan of care with patient and upcoming phone call on 02/10/22 at 9 am  for collaboration with aide   Symptom Management: Take medications as prescribed   Attend all scheduled provider appointments Call pharmacy for medication refills 3-7 days in advance of running out of medications Attend church or other social activities Call provider office for new concerns or questions  check blood sugar at prescribed times: 4 times daily check feet daily for cuts, sores or redness enter blood sugar readings and medication or insulin into daily  log take the blood sugar log to all doctor visits take the blood sugar meter to all doctor visits trim toenails straight across fill half of plate with vegetables read food labels for fat, fiber, carbohydrates and portion size wash and dry feet carefully every day wear comfortable, cotton socks wear comfortable, well-fitting shoes Take Freestyle [place], receiver and your phone to endocrinologist so they can show you how to use Keep sleep study appointment on 03/01/22 at 8 pm so you can get CPAP/ supplies  Follow Up Plan: Telephone follow up appointment with care management team member scheduled for: 02/10/22 at 9 am       CCM (HYPERTENSION)  EXPECTED OUTCOME:  MONITOR,SELF- MANAGE AND REDUCE SYMPTOMS OF HYPERTENSION       Current Barriers:  Knowledge Deficits related to Hypertension Chronic Disease Management support and education needs related to management of Hypertension Patient reports her aide monitors blood pressure 7 days per week, states readings "vary from day to day but usually pretty good", pt states she is able to check her own blood pressure Patient reports she does smoke a "few cigarettes" per day but declines smoking cessation, states she continues to weigh daily for CHF  Planned Interventions: Evaluation of current treatment plan related to hypertension self management and patient's adherence to plan as established by provider;   Reviewed medications with patient and discussed importance of compliance;  Counseled on the importance of exercise goals with target of 150 minutes per week Discussed plans with patient for ongoing care management follow up and provided patient with direct contact information for care management team; Provided education on prescribed diet low sodium and healthy food choices;  Discussed complications of poorly controlled blood pressure such as heart disease, stroke, circulatory complications, vision complications, kidney impairment, sexual  dysfunction;   Symptom Management: Take medications as prescribed   Attend all scheduled provider appointments Call pharmacy for medication refills 3-7 days in advance of running out of medications Attend church or other social activities Call provider office for new concerns or questions  check blood pressure 3 times per week choose a place to take my blood pressure (home, clinic or office, retail store) write blood pressure results in a log or diary take blood pressure log to all doctor appointments call doctor for signs and symptoms of high blood pressure take medications for blood pressure exactly as prescribed begin an exercise program report new symptoms to your doctor eat more whole grains, fruits and vegetables, lean meats and healthy fats  Follow Up Plan: Telephone follow up appointment with care management team member scheduled for:  02/10/22 at 9 am          Patient verbalizes understanding of instructions and care plan provided today and agrees to view in [facility]. Active MyChart status and patient understanding of how to access instructions and care plan via MyChart confirmed with patient.     Telephone follow up appointment with care management team member scheduled for:  02/10/22 at 9 am

## 2022-02-09 NOTE — Chronic Care Management (AMB) (Signed)
Chronic Care Management   CCM RN Visit Note  02/09/2022 Name: [name] MRN: [id number] DOB: 1950/04/30  Subjective: [name] is a 71 y.o. year old female who is a primary care patient of [name], MD. The patient was referred to the Chronic Care Management team for assistance with care management needs subsequent to provider initiation of CCM services and plan of care.    Today's Visit:  Engaged with patient by telephone for follow up visit.        Goals Addressed             This Visit's Progress    CCM (DIABETES) EXPECTED OUTCOME: MONITOR, SELF-MANAGE AND REDUCE SYMPTOMS OF DIABETES       Current Barriers:  Knowledge Deficits related to Diabetes Chronic Disease Management support and education needs related to management of Diabetes, diet Patient reports she lives alone, has CAP aide 8 hours per day/ 7 days per week and she provides transportation as well as church members, patient reports she checks CBG QID with fasting ranges low 100's recently (states no low readings over the past few weeks) and has [employer] but has been unable to use, stating " I don't understanding how this works"  pt states she took [employer] with her to appointment in November to see endocrinologist, pt states " this may not be the thing for me", pt states the receiver did ship (she thinks it did as she is not sure what the receiver looks like) , pt states she did not call for follow up with endocrinologist as instructed and take her phone so she can be shown how to use once receiver arrived.  Patient states her aide is not present, RN care manager would like to speak with aide about the [facility] and make sure the receiver has been shipped.  Patient reports she has had no recent falls since August 2023. Patient states nurse from [facility] comes in q 2 weeks to prefill medications and assist with managing medications, pt reports she is taking as prescribed, RN care manager would  like to speak with aide and home health nurse about medications, pt verbalizes understanding and agreeable to phone call tomorrow when aide is present.  Pt has needed DME - WC, cane, walker, hospital bed.  Planned Interventions: Provided education to patient about basic DM disease process; Reviewed medications with patient and discussed importance of medication adherence;        Reviewed prescribed diet with patient carbohydrate modified; Discussed plans with patient for ongoing care management follow up and provided patient with direct contact information for care management team;      Review of patient status, including review of consultants reports, relevant laboratory and other test results, and medications completed;       Screening for signs and symptoms of depression related to chronic disease state;        Assessed social determinant of health barriers;        Ask patient to take [facility] receiver and her phone to her endocrinologist for education Telephone call to [place], spoke with [facility] and requested to speak with [facility] RN that sees pt q 2 weeks, Katelyn states RN assigned to her case will call RN care manager back Message sent to [name] with update Reviewed plan of care with patient and upcoming phone call on 02/10/22 at 9 am for collaboration with aide   Symptom Management: Take medications as prescribed   Attend  all scheduled provider appointments Call pharmacy for medication refills 3-7 days in advance of running out of medications Attend church or other social activities Call provider office for new concerns or questions  check blood sugar at prescribed times: 4 times daily check feet daily for cuts, sores or redness enter blood sugar readings and medication or insulin into daily log take the blood sugar log to all doctor visits take the blood sugar meter to all doctor visits trim toenails straight across fill half of plate with vegetables read food  labels for fat, fiber, carbohydrates and portion size wash and dry feet carefully every day wear comfortable, cotton socks wear comfortable, well-fitting shoes Take Freestyle Libre, receiver and your phone to endocrinologist so they can show you how to use Keep sleep study appointment on 03/01/22 at 8 pm so you can get CPAP/ supplies  Follow Up Plan: Telephone follow up appointment with care management team member scheduled for: 02/10/22 at 9 am       CCM (HYPERTENSION)  EXPECTED OUTCOME:  MONITOR,SELF- MANAGE AND REDUCE SYMPTOMS OF HYPERTENSION       Current Barriers:  Knowledge Deficits related to Hypertension Chronic Disease Management support and education needs related to management of Hypertension Patient reports her aide monitors blood pressure 7 days per week, states readings "vary from day to day but usually pretty good", pt states she is able to check her own blood pressure Patient reports she does smoke a "few cigarettes" per day but declines smoking cessation, states she continues to weigh daily for CHF  Planned Interventions: Evaluation of current treatment plan related to hypertension self management and patient's adherence to plan as established by provider;   Reviewed medications with patient and discussed importance of compliance;  Counseled on the importance of exercise goals with target of 150 minutes per week Discussed plans with patient for ongoing care management follow up and provided patient with direct contact information for care management team; Provided education on prescribed diet low sodium and healthy food choices;  Discussed complications of poorly controlled blood pressure such as heart disease, stroke, circulatory complications, vision complications, kidney impairment, sexual dysfunction;   Symptom Management: Take medications as prescribed   Attend all scheduled provider appointments Call pharmacy for medication refills 3-7 days in advance of running out  of medications Attend church or other social activities Call provider office for new concerns or questions  check blood pressure 3 times per week choose a place to take my blood pressure (home, clinic or office, retail store) write blood pressure results in a log or diary take blood pressure log to all doctor appointments call doctor for signs and symptoms of high blood pressure take medications for blood pressure exactly as prescribed begin an exercise program report new symptoms to your doctor eat more whole grains, fruits and vegetables, lean meats and healthy fats  Follow Up Plan: Telephone follow up appointment with care management team member scheduled for:  02/10/22 at 9 am          Plan:Telephone follow up appointment with care management team member scheduled for:  02/10/22 at 9 am  Jacqlyn Larsen [facility], BSN RN Case Manager [facility] [phone]

## 2022-02-10 ENCOUNTER — Ambulatory Visit (INDEPENDENT_AMBULATORY_CARE_PROVIDER_SITE_OTHER): Payer: Medicare HMO | Admitting: *Deleted

## 2022-02-10 ENCOUNTER — Ambulatory Visit: Payer: Medicare HMO | Admitting: *Deleted

## 2022-02-10 ENCOUNTER — Telehealth: Payer: Self-pay | Admitting: *Deleted

## 2022-02-10 DIAGNOSIS — D631 Anemia in chronic kidney disease: Secondary | ICD-10-CM | POA: Diagnosis not present

## 2022-02-10 DIAGNOSIS — E1143 Type 2 diabetes mellitus with diabetic autonomic (poly)neuropathy: Secondary | ICD-10-CM

## 2022-02-10 DIAGNOSIS — I129 Hypertensive chronic kidney disease with stage 1 through stage 4 chronic kidney disease, or unspecified chronic kidney disease: Secondary | ICD-10-CM | POA: Diagnosis not present

## 2022-02-10 DIAGNOSIS — I10 Essential (primary) hypertension: Secondary | ICD-10-CM

## 2022-02-10 DIAGNOSIS — N1832 Chronic kidney disease, stage 3b: Secondary | ICD-10-CM | POA: Diagnosis not present

## 2022-02-10 DIAGNOSIS — Z79899 Other long term (current) drug therapy: Secondary | ICD-10-CM | POA: Diagnosis not present

## 2022-02-10 DIAGNOSIS — J449 Chronic obstructive pulmonary disease, unspecified: Secondary | ICD-10-CM | POA: Diagnosis not present

## 2022-02-10 NOTE — Chronic Care Management (AMB) (Signed)
Chronic Care Management   CCM RN Visit Note  02/10/2022 Name: [name] MRN: [id number] DOB: 1951-03-06  Subjective: [name] is a 71 y.o. year old female who is a primary care patient of [name], MD. The patient was referred to the Chronic Care Management team for assistance with care management needs subsequent to provider initiation of CCM services and plan of care.    Today's Visit:  Engaged with patient by telephone for follow up visit.        Goals Addressed             This Visit's Progress    CCM (DIABETES) EXPECTED OUTCOME: MONITOR, SELF-MANAGE AND REDUCE SYMPTOMS OF DIABETES       Current Barriers:  Knowledge Deficits related to Diabetes Chronic Disease Management support and education needs related to management of Diabetes, diet Patient reports she lives alone, has CAP aide 8 hours per day/ 7 days per week and she provides transportation as well as church members, patient reports she checks CBG QID with fasting ranges low 100's recently (states no low readings over the past few weeks) and has [employer] but has been unable to use, stating " I don't understanding how this works"  pt states she took [employer] with her to appointment in November to see endocrinologist, pt states " this may not be the thing for me", pt states the receiver did ship (she thinks it did as she is not sure what the receiver looks like) , pt states she did not call for follow up with endocrinologist as instructed and take her phone so she can be shown how to use once receiver arrived.  Patient states her aide is not present, RN care manager would like to speak with aide about the [facility] and make sure the receiver has been shipped.  Patient reports she has had no recent falls since August 2023. Patient states nurse from [facility] comes in q 2 weeks to prefill medications and assist with managing medications, pt reports she is taking as prescribed, RN care manager would  like to speak with aide and home health nurse about medications, pt verbalizes understanding and agreeable to phone call tomorrow when aide is present.  Pt has needed DME - WC, cane, walker, hospital bed. 12/1/23[name] RN from [facility] called RN care manager for collaboration, per [name], she sees pt once monthly and LPN [name] sees pt q 2 weeks and prefills med box, sometimes when they prefill medications, some of the medications have been swapped around or some not taken.  [name] reports pt does not have any noticeable cognitive deficits but speech is slurred sometimes and pt is sleepy, reports pt chooses to do what she wants with the medication (for example- locking up the medication, not taking some of the medication, etc).   [name] is not aware of any receiver for the [employer] and will let Kim LPN know to look for it and will let RN care manager know if she sees it,  [name] is doubtful pt will be able to use the [employer]. [name] reports patient's home is very neat and clean, well kept, does not notice any other issues that would be of concern. 02/10/22- spoke with patient who reports aide is not there today and hopes she will be back tomorrow.  Patient verbalizes understanding of collaboration with home health RN and importance of taking medications as prescribed.  Planned Interventions: Provided education to patient about  basic DM disease process; Reviewed medications with patient and discussed importance of medication adherence;        Reviewed prescribed diet with patient carbohydrate modified; Discussed plans with patient for ongoing care management follow up and provided patient with direct contact information for care management team;      Review of patient status, including review of consultants reports, relevant laboratory and other test results, and medications completed;       Screening for signs and symptoms of depression related to chronic disease state;        Assessed social  determinant of health barriers;        Ask patient to take [facility] receiver and her phone to her endocrinologist for education Telephone call to [place], spoke with [facility] and requested to speak with [facility] RN that sees pt q 2 weeks, Katelyn states RN assigned to her case will call RN care manager back Message sent to [name] with update Reviewed plan of care with patient and upcoming phone call on 02/10/22 at 9 am for collaboration with aide  Collaboration with [name] RN with [facility] Reviewed with patient the importance of taking medications as prescribed, do not skip doses, do not double up, do not swap medication around after home health RN has prefilled in medication box Reviewed with patient if she wants to lock up the medications as she says she does sometimes, to make sure and unlock at correct time and take medication as prescribed, pt verbalizes understanding  Symptom Management: Take medications as prescribed   Attend all scheduled provider appointments Call pharmacy for medication refills 3-7 days in advance of running out of medications Attend church or other social activities Call provider office for new concerns or questions  check blood sugar at prescribed times: 4 times daily check feet daily for cuts, sores or redness enter blood sugar readings and medication or insulin into daily log take the blood sugar log to all doctor visits take the blood sugar meter to all doctor visits trim toenails straight across fill half of plate with vegetables read food labels for fat, fiber, carbohydrates and portion size wash and dry feet carefully every day wear comfortable, cotton socks wear comfortable, well-fitting shoes Take Freestyle Libre, receiver and your phone to endocrinologist so they can show you how to use Keep sleep study appointment on 03/01/22 at 8 pm so you can get CPAP/ supplies Take the medication as it is in prefilled med box, you can watch  home health nurse prefill the med box and ask questions if needed  Follow Up Plan: Telephone follow up appointment with care management team member scheduled for: 03/20/22 at 1045 am          Plan:Telephone follow up appointment with care management team member scheduled for:  03/20/22 at [place] am  Jacqlyn Larsen [facility], BSN RN Case Manager [facility] [phone]

## 2022-02-10 NOTE — Telephone Encounter (Signed)
   CCM RN Visit Note   02/10/22 Name: [name] MRN: [id number]      DOB: Jul 28, 1950  Subjective: [name] is a 71 y.o. year old female who is a primary care patient of Tula Nakayama. The patient was referred to the Chronic Care Management team for assistance with care management needs subsequent to provider initiation of CCM services and plan of care.      An unsuccessful telephone outreach was attempted today to contact the patient about Chronic Care Management needs.   RN care manager called to speak with patient and aide about medications, CGM receiver and following up with endocrinologist.  Plan:Telephone follow up appointment with care management team member scheduled for:  upon care guide rescheduling.  Jacqlyn Larsen [facility], BSN RN Case Manager [place] [facility] [phone]

## 2022-02-10 NOTE — Patient Instructions (Signed)
Please call the care guide team at [phone] if you need to cancel or reschedule your appointment.   If you are experiencing a Mental Health or [place] or need someone to talk to, please call the Suicide and Crisis Lifeline: 988 call the [place] National Suicide Prevention Lifeline: 346-405-6013 or TTY: (757) 845-2561 TTY 5078452969) to talk to a trained counselor call 1-800-273-TALK (toll free, 24 hour hotline) go to [facility] [address] [place] [phone]) call the [place]: [phone] call 911   Following is a copy of your full provider care plan:   Goals Addressed             This Visit's Progress    CCM (DIABETES) EXPECTED OUTCOME: MONITOR, SELF-MANAGE AND REDUCE SYMPTOMS OF DIABETES       Current Barriers:  Knowledge Deficits related to Diabetes Chronic Disease Management support and education needs related to management of Diabetes, diet Patient reports she lives alone, has CAP aide 8 hours per day/ 7 days per week and she provides transportation as well as church members, patient reports she checks CBG QID with fasting ranges low 100's recently (states no low readings over the past few weeks) and has Freestyle [place] but has been unable to use, stating " I don't understanding how this works"  pt states she took [employer] with her to appointment in November to see endocrinologist, pt states " this may not be the thing for me", pt states the receiver did ship (she thinks it did as she is not sure what the receiver looks like) , pt states she did not call for follow up with endocrinologist as instructed and take her phone so she can be shown how to use once receiver arrived.  Patient states her aide is not present, RN care manager would like to speak with aide about the [facility] and make sure the receiver has been shipped.  Patient reports she has had no recent falls since August 2023.  Patient states nurse from [facility] comes in q 2 weeks to prefill medications and assist with managing medications, pt reports she is taking as prescribed, RN care manager would like to speak with aide and home health nurse about medications, pt verbalizes understanding and agreeable to phone call tomorrow when aide is present.  Pt has needed DME - WC, cane, walker, hospital bed. 12/1/23[name] RN from [facility] called RN care manager for collaboration, per [name], she sees pt once monthly and LPN [name] sees pt q 2 weeks and prefills med box, sometimes when they prefill medications, some of the medications have been swapped around or some not taken.  [name] reports pt does not have any noticeable cognitive deficits but speech is slurred sometimes and pt is sleepy, reports pt chooses to do what she wants with the medication (for example- locking up the medication, not taking some of the medication, etc).   [name] is not aware of any receiver for the [employer] and will let Kim LPN know to look for it and will let RN care manager know if she sees it,  [name] is doubtful pt will be able to use the [employer]. [name] reports patient's home is very neat and clean, well kept, does not notice any other issues that would be of concern. 02/10/22- spoke with patient who reports aide is not there today and hopes she will be back tomorrow.  Patient verbalizes understanding of collaboration with home health RN  and importance of taking medications as prescribed.  Planned Interventions: Provided education to patient about basic DM disease process; Reviewed medications with patient and discussed importance of medication adherence;        Reviewed prescribed diet with patient carbohydrate modified; Discussed plans with patient for ongoing care management follow up and provided patient with direct contact information for care management team;      Review of patient status, including review of consultants reports, relevant  laboratory and other test results, and medications completed;       Screening for signs and symptoms of depression related to chronic disease state;        Assessed social determinant of health barriers;        Ask patient to take [facility] receiver and her phone to her endocrinologist for education Telephone call to [place], spoke with [facility] and requested to speak with [facility] RN that sees pt q 2 weeks, Katelyn states RN assigned to her case will call RN care manager back Message sent to [name] with update Reviewed plan of care with patient and upcoming phone call on 02/10/22 at 9 am for collaboration with aide  Collaboration with [name] RN with [facility] Reviewed with patient the importance of taking medications as prescribed, do not skip doses, do not double up, do not swap medication around after home health RN has prefilled in medication box Reviewed with patient if she wants to lock up the medications as she says she does sometimes, to make sure and unlock at correct time and take medication as prescribed, pt verbalizes understanding  Symptom Management: Take medications as prescribed   Attend all scheduled provider appointments Call pharmacy for medication refills 3-7 days in advance of running out of medications Attend church or other social activities Call provider office for new concerns or questions  check blood sugar at prescribed times: 4 times daily check feet daily for cuts, sores or redness enter blood sugar readings and medication or insulin into daily log take the blood sugar log to all doctor visits take the blood sugar meter to all doctor visits trim toenails straight across fill half of plate with vegetables read food labels for fat, fiber, carbohydrates and portion size wash and dry feet carefully every day wear comfortable, cotton socks wear comfortable, well-fitting shoes Take [employer]estyle [employer], receiver and your phone to endocrinologist so  they can show you how to use Keep sleep study appointment on 03/01/22 at 8 pm so you can get CPAP/ supplies Take the medication as it is in prefilled med box, you can watch home health nurse prefill the med box and ask questions if needed  Follow Up Plan: Telephone follow up appointment with care management team member scheduled for: 03/20/22 at 1045 am          Patient verbalizes understanding of instructions and care plan provided today and agrees to view in [place]. Active MyChart status and patient understanding of how to access instructions and care plan via MyChart confirmed with patient.     Telephone follow up appointment with care management team member scheduled for:  03/20/22 a 1045 am

## 2022-02-10 NOTE — Patient Instructions (Signed)
Please call the care guide team at [phone] if you need to cancel or reschedule your appointment.   If you are experiencing a Mental Health or [place] or need someone to talk to, please call the Suicide and Crisis Lifeline: 988 call the [place] National Suicide Prevention Lifeline: 864 323 1202 or TTY: 408 629 1420 TTY [phone]) to talk to a trained counselor call 1-800-273-TALK (toll free, 24 hour hotline) go to [facility] [address], [place] [phone]) call the [place]: [phone] call 911   Following is a copy of your full provider care plan:   Goals Addressed             This Visit's Progress    CCM (DIABETES) EXPECTED OUTCOME: MONITOR, SELF-MANAGE AND REDUCE SYMPTOMS OF DIABETES       Current Barriers:  Knowledge Deficits related to Diabetes Chronic Disease Management support and education needs related to management of Diabetes, diet Patient reports she lives alone, has CAP aide 8 hours per day/ 7 days per week and she provides transportation as well as church members, patient reports she checks CBG QID with fasting ranges low 100's recently (states no low readings over the past few weeks) and has Freestyle [place] but has been unable to use, stating " I don't understanding how this works"  pt states she took [employer] with her to appointment in November to see endocrinologist, pt states " this may not be the thing for me", pt states the receiver did ship (she thinks it did as she is not sure what the receiver looks like) , pt states she did not call for follow up with endocrinologist as instructed and take her phone so she can be shown how to use once receiver arrived.  Patient states her aide is not present, RN care manager would like to speak with aide about the [facility] and make sure the receiver has been shipped.  Patient reports she has had no recent falls since August 2023.  Patient states nurse from [facility] comes in q 2 weeks to prefill medications and assist with managing medications, pt reports she is taking as prescribed, RN care manager would like to speak with aide and home health nurse about medications, pt verbalizes understanding and agreeable to phone call tomorrow when aide is present.  Pt has needed DME - WC, cane, walker, hospital bed. 12/1/23[name] RN from [facility] called RN care manager for collaboration, per [name], she sees pt once monthly and LPN [name] sees pt q 2 weeks and prefills med box, sometimes when they prefill medications, some of the medications have been swapped around or some not taken.  [name] reports pt does not have any noticeable cognitive deficits but speech is slurred sometimes and pt is sleepy, reports pt chooses to do what she wants with the medication (for example- locking up the medication, not taking some of the medication, etc).   [name] is not aware of any receiver for the [employer] and will let Kim LPN know to look for it and will let RN care manager know if she sees it,  [name] is doubtful pt will be able to use the [employer]. [name] reports patient's home is very neat and clean, well kept, does not notice any other issues that would be of concern.  Planned Interventions: Provided education to patient about basic DM disease process; Reviewed medications with patient and discussed importance of medication adherence;  Reviewed prescribed diet with patient carbohydrate modified; Discussed plans with patient for ongoing care management follow up and provided patient with direct contact information for care management team;      Review of patient status, including review of consultants reports, relevant laboratory and other test results, and medications completed;       Screening for signs and symptoms of depression related to chronic disease state;        Assessed social determinant of health barriers;        Ask patient to  take [facility] receiver and her phone to her endocrinologist for education Telephone call to [place], spoke with [facility] and requested to speak with [facility] RN that sees pt q 2 weeks, Katelyn states RN assigned to her case will call RN care manager back Message sent to [name] with update Reviewed plan of care with patient and upcoming phone call on 02/10/22 at 9 am for collaboration with aide  Collaboration with [name] RN with [place]  Symptom Management: Take medications as prescribed   Attend all scheduled provider appointments Call pharmacy for medication refills 3-7 days in advance of running out of medications Attend church or other social activities Call provider office for new concerns or questions  check blood sugar at prescribed times: 4 times daily check feet daily for cuts, sores or redness enter blood sugar readings and medication or insulin into daily log take the blood sugar log to all doctor visits take the blood sugar meter to all doctor visits trim toenails straight across fill half of plate with vegetables read food labels for fat, fiber, carbohydrates and portion size wash and dry feet carefully every day wear comfortable, cotton socks wear comfortable, well-fitting shoes Take Freestyle [place], receiver and your phone to endocrinologist so they can show you how to use Keep sleep study appointment on 03/01/22 at 8 pm so you can get CPAP/ supplies  Follow Up Plan: Telephone follow up appointment with care management team member scheduled for: 03/20/22 at 1045 am          Patient verbalizes understanding of instructions and care plan provided today and agrees to view in [place]. Active MyChart status and patient understanding of how to access instructions and care plan via MyChart confirmed with patient.     Telephone follow up appointment with care management team member scheduled for:   03/20/22 at 1045 am

## 2022-02-10 NOTE — Chronic Care Management (AMB) (Signed)
Chronic Care Management   CCM RN Visit Note  02/10/2022 Name: [name] MRN: [id number] DOB: 07/11/1950  Subjective: [name] is a 71 y.o. year old female who is a primary care patient of [name], MD. The patient was referred to the Chronic Care Management team for assistance with care management needs subsequent to provider initiation of CCM services and plan of care.    Today's Visit:  Engaged with patient by telephone for follow up visit.        Goals Addressed             This Visit's Progress    CCM (DIABETES) EXPECTED OUTCOME: MONITOR, SELF-MANAGE AND REDUCE SYMPTOMS OF DIABETES       Current Barriers:  Knowledge Deficits related to Diabetes Chronic Disease Management support and education needs related to management of Diabetes, diet Patient reports she lives alone, has CAP aide 8 hours per day/ 7 days per week and she provides transportation as well as church members, patient reports she checks CBG QID with fasting ranges low 100's recently (states no low readings over the past few weeks) and has [employer] but has been unable to use, stating " I don't understanding how this works"  pt states she took [employer] with her to appointment in November to see endocrinologist, pt states " this may not be the thing for me", pt states the receiver did ship (she thinks it did as she is not sure what the receiver looks like) , pt states she did not call for follow up with endocrinologist as instructed and take her phone so she can be shown how to use once receiver arrived.  Patient states her aide is not present, RN care manager would like to speak with aide about the [facility] and make sure the receiver has been shipped.  Patient reports she has had no recent falls since August 2023. Patient states nurse from [facility] comes in q 2 weeks to prefill medications and assist with managing medications, pt reports she is taking as prescribed, RN care manager would  like to speak with aide and home health nurse about medications, pt verbalizes understanding and agreeable to phone call tomorrow when aide is present.  Pt has needed DME - WC, cane, walker, hospital bed. 12/1/23[name] RN from [facility] called RN care manager for collaboration, per [name], she sees pt once monthly and LPN [name] sees pt q 2 weeks and prefills med box, sometimes when they prefill medications, some of the medications have been swapped around or some not taken.  [name] reports pt does not have any noticeable cognitive deficits but speech is slurred sometimes and pt is sleepy, reports pt chooses to do what she wants with the medication (for example- locking up the medication, not taking some of the medication, etc).   [name] is not aware of any receiver for the [employer] and will let Kim LPN know to look for it and will let RN care manager know if she sees it,  [name] is doubtful pt will be able to use the [employer]. [name] reports patient's home is very neat and clean, well kept, does not notice any other issues that would be of concern.  Planned Interventions: Provided education to patient about basic DM disease process; Reviewed medications with patient and discussed importance of medication adherence;        Reviewed prescribed diet with patient carbohydrate modified; Discussed plans with patient for ongoing care  management follow up and provided patient with direct contact information for care management team;      Review of patient status, including review of consultants reports, relevant laboratory and other test results, and medications completed;       Screening for signs and symptoms of depression related to chronic disease state;        Assessed social determinant of health barriers;        Ask patient to take [facility] receiver and her phone to her endocrinologist for education Telephone call to [place], spoke with [facility] and requested to speak with [facility] RN  that sees pt q 2 weeks, Katelyn states RN assigned to her case will call RN care manager back Message sent to [name] with update Reviewed plan of care with patient and upcoming phone call on 02/10/22 at 9 am for collaboration with aide  Collaboration with [name] RN with [place]  Symptom Management: Take medications as prescribed   Attend all scheduled provider appointments Call pharmacy for medication refills 3-7 days in advance of running out of medications Attend church or other social activities Call provider office for new concerns or questions  check blood sugar at prescribed times: 4 times daily check feet daily for cuts, sores or redness enter blood sugar readings and medication or insulin into daily log take the blood sugar log to all doctor visits take the blood sugar meter to all doctor visits trim toenails straight across fill half of plate with vegetables read food labels for fat, fiber, carbohydrates and portion size wash and dry feet carefully every day wear comfortable, cotton socks wear comfortable, well-fitting shoes Take Freestyle [place], receiver and your phone to endocrinologist so they can show you how to use Keep sleep study appointment on 03/01/22 at 8 pm so you can get CPAP/ supplies  Follow Up Plan: Telephone follow up appointment with care management team member scheduled for: 03/20/22 at 1045 am          Plan:Telephone follow up appointment with care management team member scheduled for:  03/20/22 at [place] am  Jacqlyn Larsen [facility], BSN RN Case Manager [facility] [phone]

## 2022-02-13 ENCOUNTER — Telehealth: Payer: Self-pay | Admitting: Family Medicine

## 2022-02-13 ENCOUNTER — Encounter: Payer: Self-pay | Admitting: Hematology

## 2022-02-13 ENCOUNTER — Other Ambulatory Visit: Payer: Self-pay

## 2022-02-13 DIAGNOSIS — E1165 Type 2 diabetes mellitus with hyperglycemia: Secondary | ICD-10-CM

## 2022-02-13 MED ORDER — TOUJEO MAX SOLOSTAR 300 UNIT/ML ~~LOC~~ SOPN: 20.0000 [IU] | PEN_INJECTOR | Freq: Every day | SUBCUTANEOUS | 0 refills | Status: DC

## 2022-02-13 NOTE — Telephone Encounter (Signed)
[name] w. [facility] called in on patient behalf.  Has med question in regard to   telmisartan (MICARDIS) 20 MG tablet  LOSARTAN '50MG'$      needs to know which patient needs to take both are In same med class   Call back info [facility] [phone]

## 2022-02-14 ENCOUNTER — Other Ambulatory Visit (HOSPITAL_COMMUNITY): Payer: Self-pay

## 2022-02-14 DIAGNOSIS — R6889 Other general symptoms and signs: Secondary | ICD-10-CM | POA: Diagnosis not present

## 2022-02-15 DIAGNOSIS — E1122 Type 2 diabetes mellitus with diabetic chronic kidney disease: Secondary | ICD-10-CM | POA: Diagnosis not present

## 2022-02-15 DIAGNOSIS — Z79899 Other long term (current) drug therapy: Secondary | ICD-10-CM | POA: Diagnosis not present

## 2022-02-15 DIAGNOSIS — I5032 Chronic diastolic (congestive) heart failure: Secondary | ICD-10-CM | POA: Diagnosis not present

## 2022-02-15 DIAGNOSIS — I129 Hypertensive chronic kidney disease with stage 1 through stage 4 chronic kidney disease, or unspecified chronic kidney disease: Secondary | ICD-10-CM | POA: Diagnosis not present

## 2022-02-15 DIAGNOSIS — N189 Chronic kidney disease, unspecified: Secondary | ICD-10-CM | POA: Diagnosis not present

## 2022-02-15 NOTE — Telephone Encounter (Signed)
[name] aware

## 2022-02-16 DIAGNOSIS — K589 Irritable bowel syndrome without diarrhea: Secondary | ICD-10-CM

## 2022-02-16 DIAGNOSIS — F39 Unspecified mood [affective] disorder: Secondary | ICD-10-CM

## 2022-02-16 DIAGNOSIS — E1142 Type 2 diabetes mellitus with diabetic polyneuropathy: Secondary | ICD-10-CM | POA: Diagnosis not present

## 2022-02-16 DIAGNOSIS — I130 Hypertensive heart and chronic kidney disease with heart failure and stage 1 through stage 4 chronic kidney disease, or unspecified chronic kidney disease: Secondary | ICD-10-CM | POA: Diagnosis not present

## 2022-02-16 DIAGNOSIS — E785 Hyperlipidemia, unspecified: Secondary | ICD-10-CM

## 2022-02-16 DIAGNOSIS — M150 Primary generalized (osteo)arthritis: Secondary | ICD-10-CM

## 2022-02-16 DIAGNOSIS — E1122 Type 2 diabetes mellitus with diabetic chronic kidney disease: Secondary | ICD-10-CM | POA: Diagnosis not present

## 2022-02-16 DIAGNOSIS — F419 Anxiety disorder, unspecified: Secondary | ICD-10-CM

## 2022-02-16 DIAGNOSIS — K5900 Constipation, unspecified: Secondary | ICD-10-CM

## 2022-02-16 DIAGNOSIS — Z8673 Personal history of transient ischemic attack (TIA), and cerebral infarction without residual deficits: Secondary | ICD-10-CM

## 2022-02-16 DIAGNOSIS — E039 Hypothyroidism, unspecified: Secondary | ICD-10-CM

## 2022-02-16 DIAGNOSIS — D631 Anemia in chronic kidney disease: Secondary | ICD-10-CM | POA: Diagnosis not present

## 2022-02-16 DIAGNOSIS — Z794 Long term (current) use of insulin: Secondary | ICD-10-CM | POA: Diagnosis not present

## 2022-02-16 DIAGNOSIS — M810 Age-related osteoporosis without current pathological fracture: Secondary | ICD-10-CM

## 2022-02-16 DIAGNOSIS — N1832 Chronic kidney disease, stage 3b: Secondary | ICD-10-CM | POA: Diagnosis not present

## 2022-02-16 DIAGNOSIS — Z79891 Long term (current) use of opiate analgesic: Secondary | ICD-10-CM

## 2022-02-16 DIAGNOSIS — F250 Schizoaffective disorder, bipolar type: Secondary | ICD-10-CM | POA: Diagnosis not present

## 2022-02-16 DIAGNOSIS — Z7982 Long term (current) use of aspirin: Secondary | ICD-10-CM

## 2022-02-16 DIAGNOSIS — M5412 Radiculopathy, cervical region: Secondary | ICD-10-CM | POA: Diagnosis not present

## 2022-02-16 DIAGNOSIS — F32A Depression, unspecified: Secondary | ICD-10-CM

## 2022-02-16 DIAGNOSIS — J449 Chronic obstructive pulmonary disease, unspecified: Secondary | ICD-10-CM | POA: Diagnosis not present

## 2022-02-16 DIAGNOSIS — K219 Gastro-esophageal reflux disease without esophagitis: Secondary | ICD-10-CM

## 2022-02-16 DIAGNOSIS — I509 Heart failure, unspecified: Secondary | ICD-10-CM | POA: Diagnosis not present

## 2022-02-17 ENCOUNTER — Inpatient Hospital Stay: Payer: Medicare HMO

## 2022-02-20 ENCOUNTER — Ambulatory Visit (INDEPENDENT_AMBULATORY_CARE_PROVIDER_SITE_OTHER): Payer: Medicare HMO | Admitting: Psychiatry

## 2022-02-20 DIAGNOSIS — Z6833 Body mass index (BMI) 33.0-33.9, adult: Secondary | ICD-10-CM | POA: Diagnosis not present

## 2022-02-20 DIAGNOSIS — E1129 Type 2 diabetes mellitus with other diabetic kidney complication: Secondary | ICD-10-CM | POA: Diagnosis not present

## 2022-02-20 DIAGNOSIS — F331 Major depressive disorder, recurrent, moderate: Secondary | ICD-10-CM | POA: Diagnosis not present

## 2022-02-20 DIAGNOSIS — E1122 Type 2 diabetes mellitus with diabetic chronic kidney disease: Secondary | ICD-10-CM | POA: Diagnosis not present

## 2022-02-20 DIAGNOSIS — I5032 Chronic diastolic (congestive) heart failure: Secondary | ICD-10-CM | POA: Diagnosis not present

## 2022-02-20 DIAGNOSIS — N170 Acute kidney failure with tubular necrosis: Secondary | ICD-10-CM | POA: Diagnosis not present

## 2022-02-20 DIAGNOSIS — E870 Hyperosmolality and hypernatremia: Secondary | ICD-10-CM | POA: Diagnosis not present

## 2022-02-20 DIAGNOSIS — R809 Proteinuria, unspecified: Secondary | ICD-10-CM | POA: Diagnosis not present

## 2022-02-20 DIAGNOSIS — I129 Hypertensive chronic kidney disease with stage 1 through stage 4 chronic kidney disease, or unspecified chronic kidney disease: Secondary | ICD-10-CM | POA: Diagnosis not present

## 2022-02-20 DIAGNOSIS — N189 Chronic kidney disease, unspecified: Secondary | ICD-10-CM | POA: Diagnosis not present
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
## 2022-02-21 ENCOUNTER — Inpatient Hospital Stay: Payer: Medicare HMO | Attending: Hematology

## 2022-02-21 ENCOUNTER — Inpatient Hospital Stay: Payer: Medicare HMO

## 2022-02-21 VITALS — BP 145/45 | HR 66 | Temp 95.6°F | Resp 18

## 2022-02-21 DIAGNOSIS — Z79899 Other long term (current) drug therapy: Secondary | ICD-10-CM | POA: Diagnosis not present

## 2022-02-21 DIAGNOSIS — N1832 Chronic kidney disease, stage 3b: Secondary | ICD-10-CM | POA: Diagnosis not present

## 2022-02-21 DIAGNOSIS — D638 Anemia in other chronic diseases classified elsewhere: Secondary | ICD-10-CM

## 2022-02-21 DIAGNOSIS — D631 Anemia in chronic kidney disease: Secondary | ICD-10-CM | POA: Diagnosis not present

## 2022-02-21 DIAGNOSIS — I129 Hypertensive chronic kidney disease with stage 1 through stage 4 chronic kidney disease, or unspecified chronic kidney disease: Secondary | ICD-10-CM | POA: Diagnosis not present

## 2022-02-21 LAB — CBC
HCT: 35.8 % — ABNORMAL LOW (ref 36.0–46.0)
Hemoglobin: 10.3 g/dL — ABNORMAL LOW (ref 12.0–15.0)
MCH: 22.4 pg — ABNORMAL LOW (ref 26.0–34.0)
MCHC: 28.8 g/dL — ABNORMAL LOW (ref 30.0–36.0)
MCV: 77.8 fL — ABNORMAL LOW (ref 80.0–100.0)
Platelets: 222 10*3/uL (ref 150–400)
RBC: 4.6 MIL/uL (ref 3.87–5.11)
RDW: 15.5 % (ref 11.5–15.5)
WBC: 8.3 10*3/uL (ref 4.0–10.5)
nRBC: 0 % (ref 0.0–0.2)

## 2022-02-21 MED ORDER — EPOETIN ALFA 10000 UNIT/ML IJ SOLN
10000.0000 [IU] | Freq: Once | INTRAMUSCULAR | Status: AC
  Administered 2022-02-21: 10000 [IU] via SUBCUTANEOUS
  Filled 2022-02-21: qty 1

## 2022-02-21 NOTE — Patient Instructions (Signed)
[name]  Discharge Instructions: Thank you for choosing [facility] to provide your oncology and hematology care.  If you have a lab appointment with the [facility], please come in thru the Main Entrance and check in at the main information desk.  Wear comfortable clothing and clothing appropriate for easy access to any Portacath or PICC line.   We strive to give you quality time with your provider. You may need to reschedule your appointment if you arrive late (15 or more minutes).  Arriving late affects you and other patients whose appointments are after yours.  Also, if you miss three or more appointments without notifying the office, you may be dismissed from the clinic at the provider's discretion.      For prescription refill requests, have your pharmacy contact our office and allow 72 hours for refills to be completed.    Today you received the following chemotherapy and/or immunotherapy agents Epogen      To help prevent nausea and vomiting after your treatment, we encourage you to take your nausea medication as directed.  BELOW ARE SYMPTOMS THAT SHOULD BE REPORTED IMMEDIATELY: *FEVER GREATER THAN 100.4 F (38 C) OR HIGHER *CHILLS OR SWEATING *NAUSEA AND VOMITING THAT IS NOT CONTROLLED WITH YOUR NAUSEA MEDICATION *UNUSUAL SHORTNESS OF BREATH *UNUSUAL BRUISING OR BLEEDING *URINARY PROBLEMS (pain or burning when urinating, or frequent urination) *BOWEL PROBLEMS (unusual diarrhea, constipation, pain near the anus) TENDERNESS IN MOUTH AND THROAT WITH OR WITHOUT PRESENCE OF ULCERS (sore throat, sores in mouth, or a toothache) UNUSUAL RASH, SWELLING OR PAIN  UNUSUAL VAGINAL DISCHARGE OR ITCHING   Items with * indicate a potential emergency and should be followed up as soon as possible or go to the Emergency Department if any problems should occur.  Please show the CHEMOTHERAPY ALERT CARD or IMMUNOTHERAPY ALERT CARD at check-in to the Emergency  Department and triage nurse.  Should you have questions after your visit or need to cancel or reschedule your appointment, please contact [facility] [phone]  and follow the prompts.  Office hours are 8:00 a.m. to 4:30 p.m. Monday - Friday. Please note that voicemails left after 4:00 p.m. may not be returned until the following business day.  We are closed weekends and major holidays. You have access to a nurse at all times for urgent questions. Please call the main number to the clinic [phone] and follow the prompts.  For any non-urgent questions, you may also contact your provider using MyChart. We now offer e-Visits for anyone 80 and older to request care online for non-urgent symptoms. For details visit [email or web address].   Also download the MyChart app! Go to the app store, search "MyChart", open the app, select [facility], and log in with your MyChart username and password.  Masks are optional in the cancer centers. If you would like for your care team to wear a mask while they are taking care of you, please let them know. You may have one support person who is at least 71 years old accompany you for your appointments.

## 2022-02-21 NOTE — Progress Notes (Signed)
Patient presents today for Epogen injection per providers order. Vital signs within parameters for injection.  Stable during administration without incident; injection site WNL; see MAR for injection details.  Patient tolerated procedure well and without incident.  No questions or complaints noted at this time.

## 2022-02-23 ENCOUNTER — Telehealth (HOSPITAL_COMMUNITY): Payer: Medicare HMO | Admitting: Psychiatry

## 2022-02-23 ENCOUNTER — Telehealth: Payer: Self-pay | Admitting: Family Medicine

## 2022-02-23 NOTE — Telephone Encounter (Signed)
Patient called asking what can provider do to get her gift card at end of year due to her cpe is not until 12/28.2023 so late in the year. Concern will mis out and not get til next year.  Please contact patient to discuss by 02/24/22.

## 2022-02-24 NOTE — Telephone Encounter (Signed)
[id number] 

## 2022-02-28 ENCOUNTER — Telehealth: Payer: Self-pay | Admitting: Family Medicine

## 2022-02-28 NOTE — Telephone Encounter (Signed)
Patient called needs a Sleep study and asking if Dr Moshe Cipro can authorize it.

## 2022-03-01 ENCOUNTER — Other Ambulatory Visit: Payer: Self-pay

## 2022-03-01 ENCOUNTER — Ambulatory Visit: Payer: Medicare HMO | Attending: Primary Care | Admitting: Internal Medicine

## 2022-03-01 ENCOUNTER — Telehealth: Payer: Self-pay | Admitting: Family Medicine

## 2022-03-01 DIAGNOSIS — K589 Irritable bowel syndrome without diarrhea: Secondary | ICD-10-CM

## 2022-03-01 DIAGNOSIS — G4733 Obstructive sleep apnea (adult) (pediatric): Secondary | ICD-10-CM

## 2022-03-01 DIAGNOSIS — Z7982 Long term (current) use of aspirin: Secondary | ICD-10-CM

## 2022-03-01 DIAGNOSIS — E1122 Type 2 diabetes mellitus with diabetic chronic kidney disease: Secondary | ICD-10-CM | POA: Diagnosis not present

## 2022-03-01 DIAGNOSIS — I509 Heart failure, unspecified: Secondary | ICD-10-CM | POA: Diagnosis not present

## 2022-03-01 DIAGNOSIS — E785 Hyperlipidemia, unspecified: Secondary | ICD-10-CM

## 2022-03-01 DIAGNOSIS — F419 Anxiety disorder, unspecified: Secondary | ICD-10-CM

## 2022-03-01 DIAGNOSIS — Z79891 Long term (current) use of opiate analgesic: Secondary | ICD-10-CM

## 2022-03-01 DIAGNOSIS — E1142 Type 2 diabetes mellitus with diabetic polyneuropathy: Secondary | ICD-10-CM | POA: Diagnosis not present

## 2022-03-01 DIAGNOSIS — E039 Hypothyroidism, unspecified: Secondary | ICD-10-CM

## 2022-03-01 DIAGNOSIS — M810 Age-related osteoporosis without current pathological fracture: Secondary | ICD-10-CM

## 2022-03-01 DIAGNOSIS — I130 Hypertensive heart and chronic kidney disease with heart failure and stage 1 through stage 4 chronic kidney disease, or unspecified chronic kidney disease: Secondary | ICD-10-CM | POA: Diagnosis not present

## 2022-03-01 DIAGNOSIS — N1832 Chronic kidney disease, stage 3b: Secondary | ICD-10-CM | POA: Diagnosis not present

## 2022-03-01 DIAGNOSIS — M150 Primary generalized (osteo)arthritis: Secondary | ICD-10-CM

## 2022-03-01 DIAGNOSIS — F250 Schizoaffective disorder, bipolar type: Secondary | ICD-10-CM | POA: Diagnosis not present

## 2022-03-01 DIAGNOSIS — J449 Chronic obstructive pulmonary disease, unspecified: Secondary | ICD-10-CM | POA: Diagnosis not present

## 2022-03-01 DIAGNOSIS — Z794 Long term (current) use of insulin: Secondary | ICD-10-CM | POA: Diagnosis not present

## 2022-03-01 DIAGNOSIS — F39 Unspecified mood [affective] disorder: Secondary | ICD-10-CM

## 2022-03-01 DIAGNOSIS — K219 Gastro-esophageal reflux disease without esophagitis: Secondary | ICD-10-CM

## 2022-03-01 DIAGNOSIS — K5900 Constipation, unspecified: Secondary | ICD-10-CM

## 2022-03-01 DIAGNOSIS — F32A Depression, unspecified: Secondary | ICD-10-CM

## 2022-03-01 DIAGNOSIS — D631 Anemia in chronic kidney disease: Secondary | ICD-10-CM | POA: Diagnosis not present

## 2022-03-01 DIAGNOSIS — Z8673 Personal history of transient ischemic attack (TIA), and cerebral infarction without residual deficits: Secondary | ICD-10-CM

## 2022-03-01 NOTE — Telephone Encounter (Signed)
Patient called left voice mail during lunch hours said was  returning a miss call.

## 2022-03-01 NOTE — Telephone Encounter (Signed)
Ref placed. Called pt and no answer.

## 2022-03-07 ENCOUNTER — Encounter: Payer: Medicare HMO | Admitting: Family Medicine

## 2022-03-07 ENCOUNTER — Telehealth: Payer: Self-pay | Admitting: Family Medicine

## 2022-03-07 NOTE — Telephone Encounter (Signed)
Patient is coughing up thick phlegm   Wants to know if its something she can take. Wants a call back.

## 2022-03-08 ENCOUNTER — Ambulatory Visit (HOSPITAL_COMMUNITY): Payer: Medicare HMO | Admitting: Psychiatry

## 2022-03-08 ENCOUNTER — Ambulatory Visit (HOSPITAL_COMMUNITY): Admission: RE | Admit: 2022-03-08 | Payer: Medicare HMO | Source: Ambulatory Visit

## 2022-03-08 NOTE — Telephone Encounter (Signed)
No report of fever , has appt in am , will address at visit

## 2022-03-09 ENCOUNTER — Encounter: Payer: Medicare HMO | Admitting: Family Medicine

## 2022-03-09 ENCOUNTER — Ambulatory Visit (INDEPENDENT_AMBULATORY_CARE_PROVIDER_SITE_OTHER): Payer: Medicare HMO | Admitting: Family Medicine

## 2022-03-09 ENCOUNTER — Encounter: Payer: Self-pay | Admitting: Family Medicine

## 2022-03-09 VITALS — BP 160/70 | HR 90 | Ht 62.0 in | Wt 165.1 lb

## 2022-03-09 DIAGNOSIS — I10 Essential (primary) hypertension: Secondary | ICD-10-CM | POA: Diagnosis not present

## 2022-03-09 DIAGNOSIS — J0100 Acute maxillary sinusitis, unspecified: Secondary | ICD-10-CM | POA: Diagnosis not present

## 2022-03-09 DIAGNOSIS — Z0001 Encounter for general adult medical examination with abnormal findings: Secondary | ICD-10-CM

## 2022-03-09 DIAGNOSIS — E1143 Type 2 diabetes mellitus with diabetic autonomic (poly)neuropathy: Secondary | ICD-10-CM

## 2022-03-09 DIAGNOSIS — J029 Acute pharyngitis, unspecified: Secondary | ICD-10-CM

## 2022-03-09 DIAGNOSIS — J209 Acute bronchitis, unspecified: Secondary | ICD-10-CM | POA: Diagnosis not present

## 2022-03-09 LAB — POCT RAPID STREP A: Rapid Strep A Screen: POSITIVE — AB

## 2022-03-09 MED ORDER — AZITHROMYCIN 250 MG PO TABS: ORAL_TABLET | ORAL | 0 refills | Status: AC

## 2022-03-09 MED ORDER — BENZONATATE 100 MG PO CAPS: 100.0000 mg | ORAL_CAPSULE | Freq: Two times a day (BID) | ORAL | 0 refills | Status: DC

## 2022-03-09 NOTE — Progress Notes (Signed)
    [name]     MRN: [id number]      DOB: Aug 03, 1950  HPI: Patient is in for annual physical exam. 4 day h/o upper and lower respiratory symptoms with chills and possible fever, started on 12/25, sore throat since 03/08/2022 Recent labs,  are reviewed. Immunization is reviewed ,.   BP (!) 160/70   Pulse 90   Ht '5\' 2"'$  (1.575 m)   Wt 165 lb 1.9 oz (74.9 kg)   SpO2 91%   BMI 30.20 kg/m ill appearing ,  Ill appearing HEENT No facial trauma or asymetry. Maxillary sinus tenderness. Erythema and exudate in oropharynx Extra occullar muscles intact.. External ears normal, . Neck: supple, anterior  adenopathy,JVD or thyromegaly.No bruits.  Chest: Decreased air entry scattered crackles , few wheezes Non tender to palpation  Cardiovascular system; Heart sounds normal,  S1 and  S2 ,no S3.  No murmur, or thrill.  Abdomen: Soft, LLQ superficially tender, no guarding or rebound.  Musculoskeletal exam: Decreased  ROM of spine, adequate in hips , , reduced in shoulders and knees. deformity ,swelling and  crepitus noted.  muscle wasting present.   Neurologic: Cranial nerves 2 to 12 intact. Power, tone ,sensation  normal throughout.  disturbance in gait. No tremor.  Skin: Intact, no ulceration, erythema , scaling or rash noted. Pigmentation normal throughout  Psych; Normal mood and affect. Judgement and concentration normal   Assessment & Plan:  Encounter for Medicare annual examination with abnormal findings Annual exam as documented.  Acute bronchitis Z pack, tessalon perle and CXR  Sore throat Rapid test positive for strep  Z pack prescribed  Maxillary sinusitis Z pack prescribed  Essential hypertension Elevated at this visit though well controlled in recent times, has been using OTC decongestants over past 3 days Will not change meds but re address in 5 weeks

## 2022-03-09 NOTE — Assessment & Plan Note (Addendum)
Annual exam as documented.

## 2022-03-09 NOTE — Patient Instructions (Signed)
F/u in 5 weeks re evaluate blood pressure, please bring meds to visit  CXR tomorrow   You have strep throat,azithromycin is prescribed  You have acute bronchitis and sinusitis, this is being treated with azithromycin and tessalon perles  Need microalb today if possible  Need eye exam and TdaP and rSV vaccines when you get better  Be careful not to fall  Thanks for choosing [facility], we consider it a privelige to serve you.

## 2022-03-09 NOTE — Assessment & Plan Note (Signed)
Z pack, tessalon perle and CXR

## 2022-03-10 ENCOUNTER — Inpatient Hospital Stay: Payer: Medicare HMO

## 2022-03-10 NOTE — Progress Notes (Signed)
HPI F Former smoker followed for obstructive sleep apnea with hypersomnia, , R/O'd Lung nodule,Wheezing dyspnea, complicated by hx resection of meningioma. Bipolar, GERD, DM, chronic epistaxis NPSG 3/8/13[name]- mild obstructive sleep apnea, AHI 14 per hour, mostly in REM. Epworth sleepiness score 14/24. Weight 164 pounds. She is living alone, divorced and disabled. Son has OSA. History of meningioma 11/23/2011. No seizure in over 3 years. Does not drive Office Spirometry 16/12/9602-[id number] effort based on appearance of loop and curve. Numbers would fit with severe restriction but her physiologic capability may be better than this. FVC 0.91/44%, FEV1 0.74/45%, FEV1/FVC 0.81, FEF 25-75% 1.43/69%. Office Spirometry 07/27/2015-weak effort but still better than her last previous trial effort. Mild restriction of exhaled volume. FVC 1.35/67%, FEV1 1.27/79%, FEV1/FVC 0.94, FEF 25-75 percent 2.14/107% --------------------------------------------------------------------------------------------------------   05/17/21- 70 yoF Smoker followed for Obstructive Sleep Apnea with hypersomnia, , R/O'd Lung nodule,Wheezing dyspnea/ tobacco, complicated by hx resection of meningioma. Bipolar/ Depression, GERD, DM,HTN,  chronic epistaxis, Covid infection [id number],  CPAP 5-12 auto/ [facility] [name])      [place] 2 Auto replaced Sept 2022 Still smoking 5-10 cigs/ day Download- Compliance 40%, AHI 8.2/ hr Body weight today- Covid vax-3 Moderna Flu vax-had -----Patient states that she would like to talk about something to help her quit smoking. States that she sleeps good mostly but sometimes depends on the pain she is in and then she does not sleep good.  Download reviewed and goals reemphasized.  She says her mask is broken and she has contacted [facility], awaiting replacement. Discussed smoking cessation.  She is willing to have the pharmacy team contact her. Always somewhat sleepy, little changed over years.   Not clear how much of this reflects CPAP use, medications, organic.  I looked at her medication list with her and I am reluctant to add additional meds.  Emphasized sleep hygiene, CPAP use and short naps. CXR 11/24/20 1V- IMPRESSION: Low lung volumes with atelectasis and/or scarring.  03/14/22- 71 yoF Smoker followed for Obstructive Sleep Apnea with hypersomnia, , R/O'd Lung nodule,Wheezing dyspnea/ tobacco, complicated by hx resection of meningioma. Bipolar/ Depression, GERD, DM,HTN,  chronic epistaxis, Covid infection [name],  CPAP 5-12 auto/ [facility] Chief of Staff)      [place] 2 Auto replaced Sept 2022> [facility] 5-15 Still smoking 5-10 cigs/ day -Neb albuterol, Ventolin hfa, singulair, prednisone 20 mg daily, Stiolto 2.5, Atrovent nasal spray Download- Compliance  Body weight today-166 lbs Covid vax-4 Moderna Flu vax-had NPSG 03/01/22- AHI 34.4/ hr, desaturation to 89%, body weight 166 lbs> CPAP auto 5-15 We reviewed sleep study and treatment options. Insurance changed her DME from [place] to [facility]  Persistent cough and post-nasal drip. Just finished antibiotic for sinus infection. Discussed most recent CXR with atelectasis-   ROS-see HPI + = positive Constitutional:   No-   weight loss, night sweats, fevers, chills, +fatigue, lassitude. HEENT:  + headaches, +difficulty swallowing, tooth/dental problems, sore throat,       No-sneezing, itching, ear ache, nasal congestion, post nasal drip,  CV:  chest pain, orthopnea, PND, swelling in lower extremities, anasarca, dizziness, +palpitations Resp: +  shortness of breath with exertion or at rest.                productive cough,   non-productive cough,  No- coughing up of blood.              No-   change in color of mucus.   wheezing.   Skin: No-   rash or lesions.  GI:  +heartburn, +indigestion, no-abdominal pain, nausea, vomiting,  GU:  MS:  + joint pain or swelling.   Neuro-     nothing unusual Psych:  No- change in mood or affect. +depression  or +anxiety.  No memory loss.  OBJ- Physical Exam - + she speaks slowly but seems alert and appropriate, +obese General-  Oriented, Affect-flat, Distress- none acute Skin- rash-none, lesions- none, excoriation- none Lymphadenopathy- none Head- Eyes- Gross vision intact, PERRLA, conjunctivae and secretions clear            Ears- Hearing, canals-normal + = positive            Nose- no-Septal dev, polyps, erosion, perforation             Throat- Mallampati III , mucosa clear , drainage- none, tonsils- atrophic,  +hoarse,  + dentures Neck- flexible , trachea midline, no stridor , thyroid nl, carotid no bruit Chest - symmetrical excursion , unlabored           Heart/CV- RRR , no murmur , no gallop  , no rub, nl s1 s2                           - JVD- none , edema- none, stasis changes- none, varices- none           Lung- clear, wheeze- none, cough- mild , dullness-none, rub- none           Chest wall-  Abd-  Br/ Gen/ Rectal- Not done, not indicated Extrem- cyanosis- none, clubbing, none, atrophy- none, strength- nl. +crolling walker Neuro- + halting speech

## 2022-03-11 DIAGNOSIS — G4733 Obstructive sleep apnea (adult) (pediatric): Secondary | ICD-10-CM

## 2022-03-11 LAB — MICROALBUMIN / CREATININE URINE RATIO
Creatinine, Urine: 87.9 mg/dL
Microalb/Creat Ratio: 41 mg/g creat — ABNORMAL HIGH (ref 0–29)
Microalbumin, Urine: 35.6 ug/mL

## 2022-03-11 NOTE — Procedures (Signed)
[facility]     Patient Name: [name], [name] Date: 03/01/2022 Gender: Female D.O.B: 18-Oct-1950 Age (years): 60 Referring Provider: Geraldo Pitter NP Height (inches): 22 Interpreting Physician: Baird Lyons MD, ABSM Weight (lbs): 166 RPSGT: Rosebud Poles BMI: 34 MRN: [id number] Neck Size: 16.00  CLINICAL INFORMATION Sleep Study Type: Split Night CPAP Indication for sleep study: OSA Epworth Sleepiness Score: 5  SLEEP STUDY TECHNIQUE As per the AASM Manual for the Scoring of Sleep and Associated Events v2.3 (April 2016) with a hypopnea requiring 4% desaturations.  The channels recorded and monitored were frontal, central and occipital EEG, electrooculogram (EOG), submentalis EMG (chin), nasal and oral airflow, thoracic and abdominal wall motion, anterior tibialis EMG, snore microphone, electrocardiogram, and pulse oximetry. Continuous positive airway pressure (CPAP) was initiated when the patient met split night criteria and was titrated according to treat sleep-disordered breathing.  MEDICATIONS Medications self-administered by patient taken the night of the study : none reported  RESPIRATORY PARAMETERS Diagnostic  Total AHI (/hr): 34.4 RDI (/hr): 34.4 OA Index (/hr): 1.7 CA Index (/hr): 0.4 REM AHI (/hr): N/A NREM AHI (/hr): 34.4 Supine AHI (/hr): 36.2 Non-supine AHI (/hr): 24.6 Min O2 Sat (%): 89.00 Mean O2 (%): 94.44 Time below 88% (min): 0   Titration  Optimal Pressure (cm): 9 AHI at Optimal Pressure (/hr): 0 Min O2 at Optimal Pressure (%): 92.0 Supine % at Optimal (%): 100 Sleep % at Optimal (%): 100   SLEEP ARCHITECTURE The recording time for the entire night was 396.1 minutes.  During a baseline period of 168.5 minutes, the patient slept for 143.0 minutes in REM and nonREM, yielding a sleep efficiency of 84.9%. Sleep onset after lights out was 19.3 minutes with  a REM latency of N/A minutes. The patient spent 11.54% of the night in stage N1 sleep, 71.33% in stage N2 sleep, 17.13% in stage N3 and 0% in REM.  During the titration period of 213.7 minutes, the patient slept for 168.4 minutes in REM and nonREM, yielding a sleep efficiency of 78.8%. Sleep onset after CPAP initiation was 5.8 minutes with a REM latency of 164.5 minutes. The patient spent 12.18% of the night in stage N1 sleep, 58.21% in stage N2 sleep, 3.86% in stage N3 and 25.8% in REM.  CARDIAC DATA The 2 lead EKG demonstrated sinus rhythm. The mean heart rate was 74.67 beats per minute. Other EKG findings include: None.  LEG MOVEMENT DATA The total Periodic Limb Movements of Sleep (PLMS) were 0. The PLMS index was 0.00 .  IMPRESSIONS - Severe obstructive sleep apnea occurred during the diagnostic portion of the study (AHI = 34.4/hour). An optimal PAP pressure was selected for this patient ( 9 cm of water) - No significant central sleep apnea occurred during the diagnostic portion of the study (CAI = 0.4/hour). - Mild oxygen desaturation was noted during the diagnostic portion of the study (Min O2 = 89.00%). Minimum O2 saturation on CPAP 9 was 92%. - The patient snored with soft snoring volume during the diagnostic portion of the study. - No cardiac abnormalities were noted during this study. - Clinically significant periodic limb movements did not occur during  sleep.  DIAGNOSIS - Obstructive Sleep Apnea (G47.33)  RECOMMENDATIONS - Trial of CPAP therapy on 9 cm H2O or autopap 5-15. - Patient used a Small size Resmed Full Face AirFit F20 mask and heated humidification. - Be careful with alcohol, sedatives and other CNS depressants that may worsen sleep apnea and disrupt normal sleep architecture. - Sleep hygiene should be reviewed to assess factors that may improve sleep quality. - Weight management and regular exercise should be initiated or continued.  [Electronically signed]  03/11/2022 11:13 AM  Baird Lyons MD, ABSM Diplomate, American Board of Sleep Medicine NPI: [id number]                    [place], [facility]  ELECTRONICALLY SIGNED ON:  03/11/2022, 11:10 AM [place] PH: [phone]   FX: [phone] [place]

## 2022-03-12 ENCOUNTER — Encounter: Payer: Self-pay | Admitting: Family Medicine

## 2022-03-12 DIAGNOSIS — I10 Essential (primary) hypertension: Secondary | ICD-10-CM

## 2022-03-12 DIAGNOSIS — J029 Acute pharyngitis, unspecified: Secondary | ICD-10-CM | POA: Insufficient documentation

## 2022-03-12 DIAGNOSIS — J320 Chronic maxillary sinusitis: Secondary | ICD-10-CM | POA: Insufficient documentation

## 2022-03-12 DIAGNOSIS — E1143 Type 2 diabetes mellitus with diabetic autonomic (poly)neuropathy: Secondary | ICD-10-CM

## 2022-03-12 NOTE — Assessment & Plan Note (Signed)
Elevated at this visit though well controlled in recent times, has been using OTC decongestants over past 3 days Will not change meds but re address in 5 weeks

## 2022-03-12 NOTE — Assessment & Plan Note (Signed)
Rapid test positive for strep  Z pack prescribed

## 2022-03-12 NOTE — Assessment & Plan Note (Signed)
Z pack prescribed 

## 2022-03-13 DIAGNOSIS — I10 Essential (primary) hypertension: Secondary | ICD-10-CM | POA: Diagnosis not present

## 2022-03-13 DIAGNOSIS — J449 Chronic obstructive pulmonary disease, unspecified: Secondary | ICD-10-CM | POA: Diagnosis not present

## 2022-03-14 ENCOUNTER — Ambulatory Visit (INDEPENDENT_AMBULATORY_CARE_PROVIDER_SITE_OTHER): Payer: Medicare HMO

## 2022-03-14 ENCOUNTER — Encounter: Payer: Self-pay | Admitting: Internal Medicine

## 2022-03-14 ENCOUNTER — Ambulatory Visit (INDEPENDENT_AMBULATORY_CARE_PROVIDER_SITE_OTHER): Payer: Medicare HMO | Admitting: Internal Medicine

## 2022-03-14 VITALS — BP 134/68 | HR 62 | Temp 98.4°F | Ht 59.0 in | Wt 166.4 lb

## 2022-03-14 DIAGNOSIS — J0101 Acute recurrent maxillary sinusitis: Secondary | ICD-10-CM | POA: Diagnosis not present

## 2022-03-14 DIAGNOSIS — J209 Acute bronchitis, unspecified: Secondary | ICD-10-CM

## 2022-03-14 DIAGNOSIS — G4733 Obstructive sleep apnea (adult) (pediatric): Secondary | ICD-10-CM | POA: Diagnosis not present

## 2022-03-14 DIAGNOSIS — J9811 Atelectasis: Secondary | ICD-10-CM | POA: Diagnosis not present

## 2022-03-14 MED ORDER — DOXYCYCLINE HYCLATE 100 MG PO TABS: 100.0000 mg | ORAL_TABLET | Freq: Two times a day (BID) | ORAL | 0 refills | Status: DC

## 2022-03-14 NOTE — Patient Instructions (Addendum)
Order- DME [facility] > [facility]  Please change CPAP to auto 5-15, mask of choice, humidifier, supplies, AirView/ card  Script sent for doxycycline antibiotic  Order- CXR   dx acute bronchitis

## 2022-03-14 NOTE — Assessment & Plan Note (Signed)
We will help with insurance mandated change from [place] to [facility], use CPAP 5-15 as she benefits.

## 2022-03-14 NOTE — Assessment & Plan Note (Signed)
Recurrent acute infections Plan- doxycycline

## 2022-03-15 ENCOUNTER — Telehealth (HOSPITAL_COMMUNITY): Payer: Medicare HMO | Admitting: Psychiatry

## 2022-03-16 ENCOUNTER — Telehealth: Payer: Self-pay | Admitting: Family Medicine

## 2022-03-16 ENCOUNTER — Telehealth: Payer: Self-pay | Admitting: Internal Medicine

## 2022-03-16 NOTE — Telephone Encounter (Signed)
FYI Patient called to let Dr Moshe Cipro know her pulmonary doctor gave her the xray of her chest.  Did a sleep study and needs to be on a machine bad.

## 2022-03-16 NOTE — Telephone Encounter (Signed)
Called and spoke with pt letting her know the results of cxr and she verbalized understanding.   While speaking with pt, pt stated that she has been having problems with a lot of phlegm production and states that the phlegm has been getting stuck in her throat which is making her gag.  Pt said that she has gotten mucinex which she is trying to take to see if that would help.  Pt also said she used to use the tessalon perles and wants to know if that might also be a recommendation that could help ease the cough.  With what has been happening recently, pt wants to know what could possibly be recommended. [name], please advise.   Allergies  Allergen Reactions   Iron Nausea And Vomiting    And itching And itching   Milk (Cow) Rash    Doesn't agree with stomach.  Doesn't agree with stomach.  Doesn't agree with stomach.    Penicillins Hives    Has patient had a PCN reaction causing immediate rash, facial/tongue/throat swelling, SOB or lightheadedness with hypotension: Yes Has patient had a PCN reaction causing severe rash involving mucus membranes or skin necrosis: No Has patient had a PCN reaction that required hospitalization No Has patient had a PCN reaction occurring within the last 10 years: No If all of the above answers are "NO", then may proceed with Cephalosporin use.  Other reaction(s): Other (see comments) Has patient had a PCN reaction causing immediate rash, facial/tongue/throat swelling, SOB or lightheadedness with hypotension: Yes Has patient had a PCN reaction causing severe rash involving mucus membranes or skin necrosis: No Has patient had a PCN reaction that required hospitalization No Has patient had a PCN reaction occurring within the last 10 years: No If all of the above answers are "NO", then may proceed with Cephalosporin use. Has patient had a PCN reaction causing immediate rash, facial/tongue/throat swelling, SOB or lightheadedness with hypotension: Yes Has  patient had a PCN reaction causing severe rash involving mucus membranes or skin necrosis: No Has patient had a PCN reaction that required hospitalization No Has patient had a PCN reaction occurring within the last 10 years: No If all of the above answers are "NO", then may proceed with Cephalosporin use. Has patient had a PCN reaction causing immediate rash, facial/tongue/throat swelling, SOB or lightheadedness with hypotension: Yes Has patient had a PCN reaction causing severe rash involving mucus membranes or skin necrosis: No Has patient had a PCN reaction that required hospitalization No Has patient had a PCN reaction occurring within the last 10 years: No If all of the above answers are "NO", then may proceed with Cephalosporin use. Has patient had a PCN reaction causing immediate rash, facial/tongue/throat swelling, SOB or lightheadedness with hypotension: Yes Has patient had a PCN reaction causing sever... (TRUNCATED)   Phenazopyridine Hives   Phenazopyridine Hcl Hives   Cephalexin Hives   Flonase [Fluticasone]     "It gave me ulcers in my nose"   Milk-Related Compounds Other (See Comments)    Doesn't agree with stomach.    Phenazopyridine Hcl Hives           Current Outpatient Medications:    ACCU-CHEK GUIDE test strip, TEST BLOOD SUGAR FOUR TIMES DAILY  AND AS NEEDED, Disp: 450 strip, Rfl: 2   Accu-Chek Softclix Lancets lancets, TEST BLOOD SUGAR THREE TIMES DAILY AS DIRECTED, Disp: 300 each, Rfl: 2   acetaminophen (TYLENOL) 325 MG tablet, Take 650 mg by mouth every 6 (six)  hours as needed., Disp: , Rfl:    albuterol (PROVENTIL) (2.5 MG/3ML) 0.083% nebulizer solution, Take 3 mLs (2.5 mg total) by nebulization every 6 (six) hours as needed for wheezing or shortness of breath., Disp: 75 mL, Rfl: 12   albuterol (PROVENTIL) (2.5 MG/3ML) 0.083% nebulizer solution, Take 3 mLs (2.5 mg total) by nebulization every 6 (six) hours as needed for wheezing or shortness of breath. (Patient not  taking: Reported on 03/14/2022), Disp: 150 mL, Rfl: 2   albuterol (VENTOLIN HFA) 108 (90 Base) MCG/ACT inhaler, INHALE 1 TO 2 PUFFS EVERY 6 HOURS AS NEEDED FOR WHEEZING, SHORTNESS OF BREATH (Patient taking differently: Inhale 1-2 puffs into the lungs every 6 (six) hours as needed for wheezing or shortness of breath.), Disp: 3 each, Rfl: 3   Alcohol Swabs (DROPSAFE ALCOHOL PREP) 70 % PADS, USE TO CLEAN FINGER PRIOR TO TESTING FOR BLOOD SUGAR  AS DIRECTED, Disp: 300 each, Rfl: 1   alendronate (FOSAMAX) 70 MG tablet, TAKE 1 TABLET EVERY 7 DAYS ON AN EMPTY STOMACH WITH A FULL GLASS OF WATER (Patient taking differently: Take 70 mg by mouth once a week.), Disp: 12 tablet, Rfl: 3   amLODipine (NORVASC) 10 MG tablet, TAKE 1 TABLET EVERY DAY (Patient taking differently: Take 10 mg by mouth daily.), Disp: 90 tablet, Rfl: 1   ascorbic acid (VITAMIN C) 500 MG tablet, Take 500 mg by mouth daily., Disp: , Rfl:    aspirin EC 81 MG tablet, Take 1 tablet (81 mg total) by mouth daily with breakfast., Disp: 120 tablet, Rfl: 2   benzonatate (TESSALON) 100 MG capsule, Take 1 capsule (100 mg total) by mouth 2 (two) times daily., Disp: 14 capsule, Rfl: 0   Blood Glucose Calibration (ACCU-CHEK GUIDE CONTROL) LIQD, USE AS DIRECTED, Disp: 1 each, Rfl: 0   blood glucose meter kit and supplies, Dispense based on patient and insurance preference. Use up to four times daily as directed. (FOR ICD-10 E10.9, E11.9)., Disp: 1 each, Rfl: 0   buPROPion (WELLBUTRIN XL) 150 MG 24 hr tablet, Take 1 tablet (150 mg total) by mouth every morning., Disp: 90 tablet, Rfl: 2   Calcium Carb-Cholecalciferol (CALTRATE 600+D3 SOFT) 600-20 MG-MCG CHEW, Chew 1 tablet by mouth daily at 12 noon., Disp: , Rfl:    cetirizine (ZYRTEC) 10 MG tablet, Take 1 tablet (10 mg total) by mouth daily., Disp: 30 tablet, Rfl: 1   Cholecalciferol (D3 PO), Take 1 tablet by mouth daily., Disp: , Rfl:    Continuous Blood Gluc Sensor (FREESTYLE LIBRE 14 DAY SENSOR) MISC, 1  each by Does not apply route every 14 (fourteen) days. Change every 2 weeks, Disp: 2 each, Rfl: 11   Docusate Sodium (DSS) 100 MG CAPS, Take 1 tablet by mouth in the morning and at bedtime., Disp: , Rfl:    doxycycline (VIBRA-TABS) 100 MG tablet, Take 1 tablet (100 mg total) by mouth 2 (two) times daily., Disp: 14 tablet, Rfl: 0   DROPLET PEN NEEDLES 31G X 8 MM MISC, USE FOR INJECTING INSULIN 4 TIMES DAILY., Disp: 400 each, Rfl: 0   ezetimibe (ZETIA) 10 MG tablet, TAKE 1 TABLET EVERY DAY, Disp: 90 tablet, Rfl: 10   insulin aspart (NOVOLOG FLEXPEN) 100 UNIT/ML FlexPen, Inject 5 Units into the skin 3 (three) times daily with meals. Give if eats 50% or more of meal. (Patient taking differently: Inject 14 Units into the skin 3 (three) times daily with meals. Give if eats 50% or more of meal.), Disp: 60 mL, Rfl:  0   insulin glargine, 2 Unit Dial, (TOUJEO MAX SOLOSTAR) 300 UNIT/ML Solostar Pen, Inject 20-26 Units into the skin daily., Disp: 12 mL, Rfl: 0   ipratropium (ATROVENT) 0.03 % nasal spray, Place 2 sprays into both nostrils every 12 (twelve) hours., Disp: 30 mL, Rfl: 12   lactobacillus acidophilus (BACID) TABS tablet, Take 2 tablets by mouth 3 (three) times daily., Disp: , Rfl:    lamoTRIgine (LAMICTAL) 100 MG tablet, Take 1 tablet (100 mg total) by mouth 2 (two) times daily., Disp: 180 tablet, Rfl: 2   levothyroxine (SYNTHROID) 50 MCG tablet, TAKE 1 TABLET DAILY SIX DAYS A WEEK AND 1/2 TABLET ON ONE DAY A WEEK, Disp: 85 tablet, Rfl: 2   LORazepam (ATIVAN) 0.5 MG tablet, Take 1 tablet (0.5 mg total) by mouth 2 (two) times daily., Disp: 60 tablet, Rfl: 2   LORazepam (ATIVAN) 1 MG tablet, TAKE 1 TABLET BY MOUTH ONCE AS NEEDED FOR UP TO 1 DOSE FOR ANXIETY., Disp: 3 tablet, Rfl: 0   metoprolol tartrate (LOPRESSOR) 50 MG tablet, TAKE 1 TABLET TWICE DAILY (NEED MD APPOINTMENT) (Patient taking differently: Take 50 mg by mouth 2 (two) times daily.), Disp: 180 tablet, Rfl: 3   Misc. Devices (MATTRESS PAD)  MISC, FIRM MATTRESS PAD for hospital bed x 1, Disp: 1 each, Rfl: 0   montelukast (SINGULAIR) 10 MG tablet, TAKE 1 TABLET EVERY DAY (Patient taking differently: Take 10 mg by mouth at bedtime.), Disp: 90 tablet, Rfl: 1   Multiple Vitamins-Minerals (MULTIVITAMIN GUMMIES ADULT PO), Take 1 tablet by mouth daily., Disp: , Rfl:    MYRBETRIQ 25 MG TB24 tablet, TAKE 1 TABLET EVERY DAY, Disp: 90 tablet, Rfl: 1   Omega-3 Fatty Acids (FISH OIL PO), Take 1 capsule by mouth daily., Disp: , Rfl:    oxyCODONE-acetaminophen (PERCOCET) 10-325 MG tablet, Take 1 tablet by mouth every 8 (eight) hours as needed for pain., Disp: , Rfl:    potassium chloride (KLOR-CON M) 10 MEQ tablet, Take 1 tablet (10 mEq total) by mouth 2 (two) times daily., Disp: 60 tablet, Rfl: 1   potassium chloride (KLOR-CON) 10 MEQ tablet, Take 1 tablet (10 mEq total) by mouth 2 (two) times daily., Disp: 30 tablet, Rfl: 2   predniSONE (DELTASONE) 20 MG tablet, Take 1 tablet (20 mg total) by mouth daily. (Patient not taking: Reported on 03/14/2022), Disp: 4 tablet, Rfl: 0   pregabalin (LYRICA) 75 MG capsule, Take 1 capsule (75 mg total) by mouth daily., Disp: , Rfl:    RABEprazole (ACIPHEX) 20 MG tablet, TAKE 1 TABLET TWICE DAILY, Disp: 180 tablet, Rfl: 2   RESTASIS 0.05 % ophthalmic emulsion, Place 2 drops into both eyes daily., Disp: , Rfl:    risperiDONE (RISPERDAL) 0.5 MG tablet, Take 1 tablet (0.5 mg total) by mouth at bedtime., Disp: 90 tablet, Rfl: 2   rosuvastatin (CRESTOR) 5 MG tablet, TAKE 1 TABLET AT BEDTIME., Disp: 90 tablet, Rfl: 1   sertraline (ZOLOFT) 100 MG tablet, Take 1 tablet (100 mg total) by mouth every morning., Disp: 90 tablet, Rfl: 2   SPIKEVAX injection, , Disp: , Rfl:    spironolactone (ALDACTONE) 50 MG tablet, Take 50 mg by mouth daily., Disp: , Rfl:    STIOLTO RESPIMAT 2.5-2.5 MCG/ACT AERS, INHALE 2 PUFFS INTO THE LUNGS DAILY., Disp: 12 g, Rfl: 1   telmisartan (MICARDIS) 20 MG tablet, Take 10 mg by mouth daily., Disp: ,  Rfl:    tizanidine (ZANAFLEX) 2 MG capsule, Take 1 capsule (2 mg  total) by mouth 3 (three) times daily. Do not drink alcohol or drive while taking this medication.  May cause drowsiness., Disp: 15 capsule, Rfl: 0   traZODone (DESYREL) 150 MG tablet, Take 1 tablet (150 mg total) by mouth at bedtime., Disp: 90 tablet, Rfl: 2   vitamin E 180 MG (400 UNITS) capsule, Take 400 Units by mouth daily., Disp: , Rfl:

## 2022-03-16 NOTE — Telephone Encounter (Signed)
Patient called not able to come into the office short notice not familiar with video.   Lingering cough and mucas can something else be called into her pharmacy. The medicine that was given is not helping.  Pharmacy: [name]

## 2022-03-17 MED ORDER — BENZONATATE 200 MG PO CAPS: 200.0000 mg | ORAL_CAPSULE | Freq: Three times a day (TID) | ORAL | 0 refills | Status: DC | PRN

## 2022-03-17 NOTE — Telephone Encounter (Signed)
Patient aware.

## 2022-03-17 NOTE — Telephone Encounter (Signed)
Please send Rx tessalon perles 200 mg, # 40, 1 cap three times daily as needed for cough  Also please encourage her to drink a little more water so she isn't thirsty. This helps with thick phlegm.

## 2022-03-17 NOTE — Telephone Encounter (Signed)
Called and spoke with pt letting her know recs per CY and she verbalized understanding. Rx for tessalon has been sent to preferred pharmacy for pt. Nothing further needed.

## 2022-03-20 ENCOUNTER — Other Ambulatory Visit (HOSPITAL_COMMUNITY): Payer: Self-pay | Admitting: Psychiatry

## 2022-03-20 ENCOUNTER — Ambulatory Visit (INDEPENDENT_AMBULATORY_CARE_PROVIDER_SITE_OTHER): Payer: Medicare HMO | Admitting: *Deleted

## 2022-03-20 DIAGNOSIS — I10 Essential (primary) hypertension: Secondary | ICD-10-CM

## 2022-03-20 DIAGNOSIS — E1143 Type 2 diabetes mellitus with diabetic autonomic (poly)neuropathy: Secondary | ICD-10-CM

## 2022-03-20 NOTE — Chronic Care Management (AMB) (Signed)
Chronic Care Management   CCM RN Visit Note  03/20/2022 Name: [name] MRN: [id number] DOB: 01/31/1951  Subjective: [name] is a 72 y.o. year old female who is a primary care patient of [name], MD. The patient was referred to the Chronic Care Management team for assistance with care management needs subsequent to provider initiation of CCM services and plan of care.    Today's Visit:  Engaged with patient by telephone for follow up visit.        Goals Addressed             This Visit's Progress    CCM (DIABETES) EXPECTED OUTCOME: MONITOR, SELF-MANAGE AND REDUCE SYMPTOMS OF DIABETES       Current Barriers:  Knowledge Deficits related to Diabetes Chronic Disease Management support and education needs related to management of Diabetes, diet Patient reports she lives alone, has CAP aide 8 hours per day/ 7 days per week and she provides transportation as well as church members, patient reports she checks CBG QID with fasting ranges low 100's recently with reading 150 today (states no low readings recently) and has [employer] and states she has decided not to use, feels this is too much trouble, Patient states her aide is there today and is busy. Patient reports she has had no recent falls since August 2023. Patient states nurse from [facility] comes in q 2 weeks to prefill medications and assist with managing medications, pt reports she is taking as prescribed.  Pt has needed DME - WC, cane, walker, hospital bed.  Planned Interventions: Provided education to patient about basic DM disease process; Reviewed medications with patient and discussed importance of medication adherence;        Reviewed prescribed diet with patient carbohydrate modified; Counseled on importance of regular laboratory monitoring as prescribed;        Discussed plans with patient for ongoing care management follow up and provided patient with direct contact information for care management team;       Advised patient, providing education and rationale, to check cbg per doctor's order  and record        Review of patient status, including review of consultants reports, relevant laboratory and other test results, and medications completed;       Reinforced with patient the importance of taking medications as prescribed, do not skip doses, do not double up, do not swap medication around after home health RN has prefilled in medication box Reinforced with patient if she wants to lock up the medications as she says she does sometimes, to make sure and unlock at correct time and take medication as prescribed, pt verbalizes understanding  Symptom Management: Take medications as prescribed   Attend all scheduled provider appointments Call pharmacy for medication refills 3-7 days in advance of running out of medications Attend church or other social activities Call provider office for new concerns or questions  check blood sugar at prescribed times: 4 times daily check feet daily for cuts, sores or redness enter blood sugar readings and medication or insulin into daily log take the blood sugar log to all doctor visits take the blood sugar meter to all doctor visits trim toenails straight across fill half of plate with vegetables manage portion size wash and dry feet carefully every day wear comfortable, cotton socks wear comfortable, well-fitting shoes CPAP/ supplies were ordered by your pulmonologist Take the medication as it is in prefilled med box, you can watch home health nurse  prefill the med box and ask questions if needed  Follow Up Plan: Telephone follow up appointment with care management team member scheduled for:  05/11/22 at 215 pm       CCM (HYPERTENSION)  EXPECTED OUTCOME:  MONITOR,SELF- MANAGE AND REDUCE SYMPTOMS OF HYPERTENSION       Current Barriers:  Knowledge Deficits related to Hypertension Chronic Disease Management support and education needs related to management  of Hypertension Patient reports her aide monitors blood pressure 7 days per week, states readings "vary from day to day but usually pretty good", pt states she is able to check her own blood pressure Patient reports she does smoke a "few cigarettes" per day but declines smoking cessation, states she continues to weigh daily for CHF  Planned Interventions: Evaluation of current treatment plan related to hypertension self management and patient's adherence to plan as established by provider;   Reviewed medications with patient and discussed importance of compliance;  Counseled on the importance of exercise goals with target of 150 minutes per week Discussed plans with patient for ongoing care management follow up and provided patient with direct contact information for care management team; Provided education on prescribed diet low sodium and healthy food choices;  Discussed complications of poorly controlled blood pressure such as heart disease, stroke, circulatory complications, vision complications, kidney impairment, sexual dysfunction;   Symptom Management: Take medications as prescribed   Attend all scheduled provider appointments Call pharmacy for medication refills 3-7 days in advance of running out of medications Attend church or other social activities Call provider office for new concerns or questions  check blood pressure 3 times per week choose a place to take my blood pressure (home, clinic or office, retail store) write blood pressure results in a log or diary learn about high blood pressure take blood pressure log to all doctor appointments call doctor for signs and symptoms of high blood pressure keep all doctor appointments take medications for blood pressure exactly as prescribed begin an exercise program report new symptoms to your doctor eat more whole grains, fruits and vegetables, lean meats and healthy fats Continue to follow low salt diet- read labels  Follow Up  Plan: Telephone follow up appointment with care management team member scheduled for:  05/11/22 at 215 pm          Plan:Telephone follow up appointment with care management team member scheduled for:  05/11/22 at 215 pm  Jacqlyn Larsen [facility], BSN RN Case Manager [facility] [phone]

## 2022-03-20 NOTE — Patient Instructions (Signed)
Please call the care guide team at [phone] if you need to cancel or reschedule your appointment.   If you are experiencing a Mental Health or [place] or need someone to talk to, please call the Suicide and Crisis Lifeline: 988 call the [place] National Suicide Prevention Lifeline: (510)632-5334 or TTY: 579 492 1368 TTY (307)585-8504) to talk to a trained counselor call 1-800-273-TALK (toll free, 24 hour hotline) go to [facility] [address], [place] [phone]) call the [facility]: [phone] call 911   Following is a copy of the CCM Program Consent:  CCM service includes personalized support from designated clinical staff supervised by the physician, including individualized plan of care and coordination with other care providers 24/7 contact phone numbers for assistance for urgent and routine care needs. Service will only be billed when office clinical staff spend 20 minutes or more in a month to coordinate care. Only one practitioner may furnish and bill the service in a calendar month. The patient may stop CCM services at amy time (effective at the end of the month) by phone call to the office staff. The patient will be responsible for cost sharing (co-pay) or up to 20% of the service fee (after annual deductible is met)  Following is a copy of your full provider care plan:   Goals Addressed             This Visit's Progress    CCM (DIABETES) EXPECTED OUTCOME: MONITOR, SELF-MANAGE AND REDUCE SYMPTOMS OF DIABETES       Current Barriers:  Knowledge Deficits related to Diabetes Chronic Disease Management support and education needs related to management of Diabetes, diet Patient reports she lives alone, has CAP aide 8 hours per day/ 7 days per week and she provides transportation as well as church members, patient reports she checks CBG QID with fasting ranges low 100's recently with reading 150 today  (states no low readings recently) and has [employer] and states she has decided not to use, feels this is too much trouble, Patient states her aide is there today and is busy. Patient reports she has had no recent falls since August 2023. Patient states nurse from [facility] comes in q 2 weeks to prefill medications and assist with managing medications, pt reports she is taking as prescribed.  Pt has needed DME - WC, cane, walker, hospital bed.  Planned Interventions: Provided education to patient about basic DM disease process; Reviewed medications with patient and discussed importance of medication adherence;        Reviewed prescribed diet with patient carbohydrate modified; Counseled on importance of regular laboratory monitoring as prescribed;        Discussed plans with patient for ongoing care management follow up and provided patient with direct contact information for care management team;      Advised patient, providing education and rationale, to check cbg per doctor's order  and record        Review of patient status, including review of consultants reports, relevant laboratory and other test results, and medications completed;       Reinforced with patient the importance of taking medications as prescribed, do not skip doses, do not double up, do not swap medication around after home health RN has prefilled in medication box Reinforced with patient if she wants to lock up the medications as she says she does sometimes, to make sure and unlock at correct time and take medication as prescribed, pt verbalizes understanding  Symptom Management: Take medications as prescribed   Attend all scheduled provider appointments Call pharmacy for medication refills 3-7 days in advance of running out of medications Attend church or other social activities Call provider office for new concerns or questions  check blood sugar at prescribed times: 4 times daily check feet daily for cuts, sores or  redness enter blood sugar readings and medication or insulin into daily log take the blood sugar log to all doctor visits take the blood sugar meter to all doctor visits trim toenails straight across fill half of plate with vegetables manage portion size wash and dry feet carefully every day wear comfortable, cotton socks wear comfortable, well-fitting shoes CPAP/ supplies were ordered by your pulmonologist Take the medication as it is in prefilled med box, you can watch home health nurse prefill the med box and ask questions if needed  Follow Up Plan: Telephone follow up appointment with care management team member scheduled for:  05/11/22 at 215 pm       CCM (HYPERTENSION)  EXPECTED OUTCOME:  MONITOR,SELF- MANAGE AND REDUCE SYMPTOMS OF HYPERTENSION       Current Barriers:  Knowledge Deficits related to Hypertension Chronic Disease Management support and education needs related to management of Hypertension Patient reports her aide monitors blood pressure 7 days per week, states readings "vary from day to day but usually pretty good", pt states she is able to check her own blood pressure Patient reports she does smoke a "few cigarettes" per day but declines smoking cessation, states she continues to weigh daily for CHF  Planned Interventions: Evaluation of current treatment plan related to hypertension self management and patient's adherence to plan as established by provider;   Reviewed medications with patient and discussed importance of compliance;  Counseled on the importance of exercise goals with target of 150 minutes per week Discussed plans with patient for ongoing care management follow up and provided patient with direct contact information for care management team; Provided education on prescribed diet low sodium and healthy food choices;  Discussed complications of poorly controlled blood pressure such as heart disease, stroke, circulatory complications, vision complications,  kidney impairment, sexual dysfunction;   Symptom Management: Take medications as prescribed   Attend all scheduled provider appointments Call pharmacy for medication refills 3-7 days in advance of running out of medications Attend church or other social activities Call provider office for new concerns or questions  check blood pressure 3 times per week choose a place to take my blood pressure (home, clinic or office, retail store) write blood pressure results in a log or diary learn about high blood pressure take blood pressure log to all doctor appointments call doctor for signs and symptoms of high blood pressure keep all doctor appointments take medications for blood pressure exactly as prescribed begin an exercise program report new symptoms to your doctor eat more whole grains, fruits and vegetables, lean meats and healthy fats Continue to follow low salt diet- read labels  Follow Up Plan: Telephone follow up appointment with care management team member scheduled for:  05/11/22 at 215 pm          Patient verbalizes understanding of instructions and care plan provided today and agrees to view in [place]. Active MyChart status and patient understanding of how to access instructions and care plan via MyChart confirmed with patient.     Telephone follow up appointment with care management team member scheduled for:   05/11/22 at 215 pm

## 2022-03-21 DIAGNOSIS — F250 Schizoaffective disorder, bipolar type: Secondary | ICD-10-CM | POA: Diagnosis not present

## 2022-03-21 DIAGNOSIS — J449 Chronic obstructive pulmonary disease, unspecified: Secondary | ICD-10-CM | POA: Diagnosis not present

## 2022-03-21 DIAGNOSIS — I509 Heart failure, unspecified: Secondary | ICD-10-CM | POA: Diagnosis not present

## 2022-03-21 DIAGNOSIS — Z794 Long term (current) use of insulin: Secondary | ICD-10-CM | POA: Diagnosis not present

## 2022-03-21 DIAGNOSIS — N1832 Chronic kidney disease, stage 3b: Secondary | ICD-10-CM | POA: Diagnosis not present

## 2022-03-21 DIAGNOSIS — D631 Anemia in chronic kidney disease: Secondary | ICD-10-CM | POA: Diagnosis not present

## 2022-03-21 DIAGNOSIS — E1122 Type 2 diabetes mellitus with diabetic chronic kidney disease: Secondary | ICD-10-CM | POA: Diagnosis not present

## 2022-03-21 DIAGNOSIS — I130 Hypertensive heart and chronic kidney disease with heart failure and stage 1 through stage 4 chronic kidney disease, or unspecified chronic kidney disease: Secondary | ICD-10-CM | POA: Diagnosis not present

## 2022-03-21 DIAGNOSIS — E1142 Type 2 diabetes mellitus with diabetic polyneuropathy: Secondary | ICD-10-CM | POA: Diagnosis not present

## 2022-03-22 ENCOUNTER — Telehealth (HOSPITAL_COMMUNITY): Payer: Medicare HMO | Admitting: Psychiatry

## 2022-03-22 DIAGNOSIS — D631 Anemia in chronic kidney disease: Secondary | ICD-10-CM | POA: Diagnosis not present

## 2022-03-22 DIAGNOSIS — E1142 Type 2 diabetes mellitus with diabetic polyneuropathy: Secondary | ICD-10-CM | POA: Diagnosis not present

## 2022-03-22 DIAGNOSIS — Z794 Long term (current) use of insulin: Secondary | ICD-10-CM | POA: Diagnosis not present

## 2022-03-22 DIAGNOSIS — I130 Hypertensive heart and chronic kidney disease with heart failure and stage 1 through stage 4 chronic kidney disease, or unspecified chronic kidney disease: Secondary | ICD-10-CM | POA: Diagnosis not present

## 2022-03-22 DIAGNOSIS — I509 Heart failure, unspecified: Secondary | ICD-10-CM | POA: Diagnosis not present

## 2022-03-22 DIAGNOSIS — F250 Schizoaffective disorder, bipolar type: Secondary | ICD-10-CM | POA: Diagnosis not present

## 2022-03-22 DIAGNOSIS — J449 Chronic obstructive pulmonary disease, unspecified: Secondary | ICD-10-CM | POA: Diagnosis not present

## 2022-03-22 DIAGNOSIS — E1122 Type 2 diabetes mellitus with diabetic chronic kidney disease: Secondary | ICD-10-CM | POA: Diagnosis not present

## 2022-03-22 DIAGNOSIS — N1832 Chronic kidney disease, stage 3b: Secondary | ICD-10-CM | POA: Diagnosis not present

## 2022-03-23 ENCOUNTER — Other Ambulatory Visit: Payer: Self-pay | Admitting: Physician Assistant

## 2022-03-23 ENCOUNTER — Telehealth: Payer: Self-pay | Admitting: Internal Medicine

## 2022-03-23 NOTE — Telephone Encounter (Signed)
Spk to patient cxr results given, she wanted to know about having stomach surgery and was advised to contact pcp to discuss

## 2022-03-23 NOTE — Telephone Encounter (Signed)
Patient called and said she missed a call on yesterday from the office.  She is returning the call.  Please advise.  CB# [phone]

## 2022-03-23 NOTE — Telephone Encounter (Signed)
PCP should manage this Ok to refill x 1 if she is out but let her know PCP should manage

## 2022-03-24 ENCOUNTER — Telehealth: Payer: Self-pay | Admitting: Family Medicine

## 2022-03-24 NOTE — Telephone Encounter (Signed)
Patient called in regard to R hand   Finger tips swollen  One finger is crooked  Almost can't write.  Wants a call back.

## 2022-03-27 ENCOUNTER — Ambulatory Visit: Payer: Medicare HMO | Admitting: Internal Medicine

## 2022-03-27 DIAGNOSIS — M961 Postlaminectomy syndrome, not elsewhere classified: Secondary | ICD-10-CM | POA: Diagnosis not present

## 2022-03-27 NOTE — Telephone Encounter (Signed)
Spoke with patient. She is seeing her ortho Dr. about this.

## 2022-03-28 ENCOUNTER — Encounter: Payer: Self-pay | Admitting: Podiatry

## 2022-03-28 ENCOUNTER — Ambulatory Visit (INDEPENDENT_AMBULATORY_CARE_PROVIDER_SITE_OTHER): Payer: Medicare HMO | Admitting: Podiatry

## 2022-03-28 VITALS — BP 128/57

## 2022-03-28 DIAGNOSIS — E1142 Type 2 diabetes mellitus with diabetic polyneuropathy: Secondary | ICD-10-CM

## 2022-03-28 DIAGNOSIS — B351 Tinea unguium: Secondary | ICD-10-CM

## 2022-03-28 DIAGNOSIS — M79676 Pain in unspecified toe(s): Secondary | ICD-10-CM

## 2022-03-28 NOTE — Progress Notes (Signed)
Subjective:  Patient ID: [name], female    DOB: 07-Mar-1951,  MRN: [id number]  [name] presents to clinic today for at risk foot care with history of diabetic neuropathy and painful thick toenails that are difficult to trim. Pain interferes with ambulation. Aggravating factors include wearing enclosed shoe gear. Pain is relieved with periodic professional debridement.  Chief Complaint  Patient presents with   Nail Problem    [facility]-Did not check today A1C-5.7 PCP-Margaret [name] PCP VST-02/2022   New problem(s): None.   PCP is [name], MD.  Allergies  Allergen Reactions   Iron Nausea And Vomiting    And itching And itching   Milk ([place]) Rash    Doesn't agree with stomach.  Doesn't agree with stomach.  Doesn't agree with stomach.    Penicillins Hives    Has patient had a PCN reaction causing immediate rash, facial/tongue/throat swelling, SOB or lightheadedness with hypotension: Yes Has patient had a PCN reaction causing severe rash involving mucus membranes or skin necrosis: No Has patient had a PCN reaction that required hospitalization No Has patient had a PCN reaction occurring within the last 10 years: No If all of the above answers are "NO", then may proceed with Cephalosporin use.  Other reaction(s): Other (see comments) Has patient had a PCN reaction causing immediate rash, facial/tongue/throat swelling, SOB or lightheadedness with hypotension: Yes Has patient had a PCN reaction causing severe rash involving mucus membranes or skin necrosis: No Has patient had a PCN reaction that required hospitalization No Has patient had a PCN reaction occurring within the last 10 years: No If all of the above answers are "NO", then may proceed with Cephalosporin use. Has patient had a PCN reaction causing immediate rash, facial/tongue/throat swelling, SOB or lightheadedness with hypotension: Yes Has patient had a PCN reaction causing severe rash involving mucus  membranes or skin necrosis: No Has patient had a PCN reaction that required hospitalization No Has patient had a PCN reaction occurring within the last 10 years: No If all of the above answers are "NO", then may proceed with Cephalosporin use. Has patient had a PCN reaction causing immediate rash, facial/tongue/throat swelling, SOB or lightheadedness with hypotension: Yes Has patient had a PCN reaction causing severe rash involving mucus membranes or skin necrosis: No Has patient had a PCN reaction that required hospitalization No Has patient had a PCN reaction occurring within the last 10 years: No If all of the above answers are "NO", then may proceed with Cephalosporin use. Has patient had a PCN reaction causing immediate rash, facial/tongue/throat swelling, SOB or lightheadedness with hypotension: Yes Has patient had a PCN reaction causing sever... (TRUNCATED)   Phenazopyridine Hives   Phenazopyridine Hcl Hives   Cephalexin Hives   Flonase [Fluticasone]     "It gave me ulcers in my nose"   Milk-Related Compounds Other (See Comments)    Doesn't agree with stomach.    Phenazopyridine Hcl Hives          Review of Systems: Negative except as noted in the HPI.  Objective:  Vitals:   03/28/22 0919  BP: (!) 128/57   [name] is a pleasant 72 y.o. female in NAD. AAO x 3.  Neurovascular Examination: Capillary refill time to digits immediate b/l. Faintly palpable pedal pulses b/l LE. Pedal hair sparse b/l.  Lower extremity skin temperature gradient within normal limits. No edema noted b/l lower extremities.   Protective sensation intact diminished bilaterally with 10g monofilament b/l.  Dermatological:  Skin warm and supple b/l lower extremities. No open wounds b/l lower extremities. No interdigital macerations b/l lower extremities. Toenails 1-5 right, left great toe and 3-5 left elongated, discolored, dystrophic, thickened, crumbly with subungual debris and tenderness to dorsal  palpation.   Anonychia noted L 2nd toe. Nailbed(s) epithelialized.   Resolved porokeratotic lesion plantarlateral midfoot of RLE and bilateral heels with tenderness to palpation. No erythema, no edema, no drainage, no fluctuance.  Musculoskeletal:  Normal muscle strength 5/5 to all lower extremity muscle groups bilaterally. No pain crepitus or joint limitation noted with ROM b/l lower extremities. HAV with bunion deformity b/l. Uses walker for gait assistance.  Assessment/Plan: 1. Pain due to onychomycosis of toenail   2. Diabetic polyneuropathy associated with type 2 diabetes mellitus ([place])     -Patient was evaluated and treated. All patient's and/or POA's questions/concerns answered on today's visit. -Continue foot and shoe inspections daily. Monitor blood glucose per PCP/Endocrinologist's recommendations. -Continue supportive shoe gear daily. -Mycotic toenails 1-5 right foot, 3-5 left foot, and left great toe were debrided in length and girth with sterile nail nippers and dremel without iatrogenic bleeding. -Patient/POA to call should there be question/concern in the interim.   Return in about 3 months (around 06/27/2022).  Marzetta Board, DPM

## 2022-03-29 ENCOUNTER — Telehealth: Payer: Self-pay | Admitting: Family Medicine

## 2022-03-29 NOTE — Telephone Encounter (Signed)
Patient called said [name] has contacted her and asking her for some type of injection, patient is confused, she asked if nurse can return her call at [phone].

## 2022-03-30 ENCOUNTER — Ambulatory Visit (HOSPITAL_COMMUNITY): Payer: Medicare HMO | Admitting: Psychiatry

## 2022-03-30 DIAGNOSIS — Z794 Long term (current) use of insulin: Secondary | ICD-10-CM | POA: Diagnosis not present

## 2022-03-30 DIAGNOSIS — D631 Anemia in chronic kidney disease: Secondary | ICD-10-CM

## 2022-03-30 DIAGNOSIS — I130 Hypertensive heart and chronic kidney disease with heart failure and stage 1 through stage 4 chronic kidney disease, or unspecified chronic kidney disease: Secondary | ICD-10-CM

## 2022-03-30 DIAGNOSIS — E039 Hypothyroidism, unspecified: Secondary | ICD-10-CM

## 2022-03-30 DIAGNOSIS — M150 Primary generalized (osteo)arthritis: Secondary | ICD-10-CM

## 2022-03-30 DIAGNOSIS — F39 Unspecified mood [affective] disorder: Secondary | ICD-10-CM

## 2022-03-30 DIAGNOSIS — K589 Irritable bowel syndrome without diarrhea: Secondary | ICD-10-CM

## 2022-03-30 DIAGNOSIS — Z7982 Long term (current) use of aspirin: Secondary | ICD-10-CM

## 2022-03-30 DIAGNOSIS — N1832 Chronic kidney disease, stage 3b: Secondary | ICD-10-CM

## 2022-03-30 DIAGNOSIS — Z6833 Body mass index (BMI) 33.0-33.9, adult: Secondary | ICD-10-CM

## 2022-03-30 DIAGNOSIS — I509 Heart failure, unspecified: Secondary | ICD-10-CM

## 2022-03-30 DIAGNOSIS — Z79891 Long term (current) use of opiate analgesic: Secondary | ICD-10-CM

## 2022-03-30 DIAGNOSIS — F419 Anxiety disorder, unspecified: Secondary | ICD-10-CM

## 2022-03-30 DIAGNOSIS — E669 Obesity, unspecified: Secondary | ICD-10-CM

## 2022-03-30 DIAGNOSIS — E785 Hyperlipidemia, unspecified: Secondary | ICD-10-CM

## 2022-03-30 DIAGNOSIS — E1122 Type 2 diabetes mellitus with diabetic chronic kidney disease: Secondary | ICD-10-CM

## 2022-03-30 DIAGNOSIS — J449 Chronic obstructive pulmonary disease, unspecified: Secondary | ICD-10-CM

## 2022-03-30 DIAGNOSIS — E1142 Type 2 diabetes mellitus with diabetic polyneuropathy: Secondary | ICD-10-CM | POA: Diagnosis not present

## 2022-03-30 DIAGNOSIS — K5900 Constipation, unspecified: Secondary | ICD-10-CM

## 2022-03-30 DIAGNOSIS — F250 Schizoaffective disorder, bipolar type: Secondary | ICD-10-CM

## 2022-03-30 DIAGNOSIS — K219 Gastro-esophageal reflux disease without esophagitis: Secondary | ICD-10-CM

## 2022-03-30 DIAGNOSIS — M810 Age-related osteoporosis without current pathological fracture: Secondary | ICD-10-CM

## 2022-03-30 DIAGNOSIS — F32A Depression, unspecified: Secondary | ICD-10-CM

## 2022-03-30 NOTE — Telephone Encounter (Signed)
Got it figured out. [facility] made a mistake

## 2022-03-31 ENCOUNTER — Inpatient Hospital Stay: Payer: Medicare HMO

## 2022-04-03 ENCOUNTER — Telehealth (INDEPENDENT_AMBULATORY_CARE_PROVIDER_SITE_OTHER): Payer: Medicare HMO | Admitting: Psychiatry

## 2022-04-03 ENCOUNTER — Encounter (HOSPITAL_COMMUNITY): Payer: Self-pay | Admitting: Psychiatry

## 2022-04-03 DIAGNOSIS — Z794 Long term (current) use of insulin: Secondary | ICD-10-CM | POA: Diagnosis not present

## 2022-04-03 DIAGNOSIS — E1142 Type 2 diabetes mellitus with diabetic polyneuropathy: Secondary | ICD-10-CM | POA: Diagnosis not present

## 2022-04-03 DIAGNOSIS — E1122 Type 2 diabetes mellitus with diabetic chronic kidney disease: Secondary | ICD-10-CM | POA: Diagnosis not present

## 2022-04-03 DIAGNOSIS — I509 Heart failure, unspecified: Secondary | ICD-10-CM | POA: Diagnosis not present

## 2022-04-03 DIAGNOSIS — F250 Schizoaffective disorder, bipolar type: Secondary | ICD-10-CM | POA: Diagnosis not present

## 2022-04-03 DIAGNOSIS — F331 Major depressive disorder, recurrent, moderate: Secondary | ICD-10-CM | POA: Diagnosis not present

## 2022-04-03 DIAGNOSIS — I130 Hypertensive heart and chronic kidney disease with heart failure and stage 1 through stage 4 chronic kidney disease, or unspecified chronic kidney disease: Secondary | ICD-10-CM | POA: Diagnosis not present

## 2022-04-03 DIAGNOSIS — J449 Chronic obstructive pulmonary disease, unspecified: Secondary | ICD-10-CM | POA: Diagnosis not present

## 2022-04-03 DIAGNOSIS — N1832 Chronic kidney disease, stage 3b: Secondary | ICD-10-CM | POA: Diagnosis not present

## 2022-04-03 DIAGNOSIS — D631 Anemia in chronic kidney disease: Secondary | ICD-10-CM | POA: Diagnosis not present

## 2022-04-03 MED ORDER — LORAZEPAM 0.5 MG PO TABS: 0.5000 mg | ORAL_TABLET | Freq: Two times a day (BID) | ORAL | 0 refills | Status: DC

## 2022-04-03 MED ORDER — BUPROPION HCL ER (XL) 150 MG PO TB24: 150.0000 mg | ORAL_TABLET | ORAL | 2 refills | Status: DC

## 2022-04-03 MED ORDER — LAMOTRIGINE 100 MG PO TABS: 100.0000 mg | ORAL_TABLET | Freq: Two times a day (BID) | ORAL | 2 refills | Status: DC

## 2022-04-03 MED ORDER — RISPERIDONE 0.5 MG PO TABS: 0.5000 mg | ORAL_TABLET | Freq: Every day | ORAL | 2 refills | Status: DC

## 2022-04-03 MED ORDER — TRAZODONE HCL 150 MG PO TABS: 150.0000 mg | ORAL_TABLET | Freq: Every day | ORAL | 2 refills | Status: DC

## 2022-04-03 MED ORDER — SERTRALINE HCL 100 MG PO TABS: 100.0000 mg | ORAL_TABLET | ORAL | 2 refills | Status: DC

## 2022-04-03 NOTE — Progress Notes (Signed)
Virtual Visit via Telephone Note  I connected with [name] on 04/03/22 at  1:40 PM EST by telephone and verified that I am speaking with the correct person using two identifiers.  Location: Patient: home Provider: office   I discussed the limitations, risks, security and privacy concerns of performing an evaluation and management service by telephone and the availability of in person appointments. I also discussed with the patient that there may be a patient responsible charge related to this service. The patient expressed understanding and agreed to proceed.      I discussed the assessment and treatment plan with the patient. The patient was provided an opportunity to ask questions and all were answered. The patient agreed with the plan and demonstrated an understanding of the instructions.   The patient was advised to call back or seek an in-person evaluation if the symptoms worsen or if the condition fails to improve as anticipated.  I provided 15 minutes of non-face-to-face time during this encounter.   Levonne Spiller, MD  [facility] MD/PA/NP OP Progress Note  04/03/2022 1:54 PM [name]  MRN:  [id number]  Chief Complaint:  Chief Complaint  Patient presents with   Depression   Anxiety   Follow-up   HPI: This patient is a 72 year old divorced white female who lives alone in [place].  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  She states she is doing about the same.  Her mood fluctuates.  She is upset with her son because he only comes to see her and brings her daughter only twice a month.  She feels lonely a lot of the time.  She does have an aide who comes in every day to help her and spend time with her.  She is eating and sleeping better.  She is more compliant with her medications and she still feels that she is benefiting from her medicines for depression and anxiety. Visit Diagnosis:     ICD-10-CM   1. Major depressive disorder, recurrent episode, moderate (HCC)  F33.1       Past Psychiatric History: Numerous hospitalizations for depression years ago including ECT treatment  Past Medical History:  Past Medical History:  Diagnosis Date   Allergy    Anemia    Anemia in chronic kidney disease (CKD) 08/10/2021   Anemia in chronic kidney disease (CKD) 10/12/2021   Anxiety    takes Ativan daily   Arthritis    Assistance needed for mobility    Bipolar disorder ([place])    takes Risperdal nightly   Blood transfusion    Brain tumor ([place])    Cancer ([place])    In her gum   Carpal tunnel syndrome of right wrist 05/23/2011   Cervical disc disorder with radiculopathy of cervical region 10/31/2012   Chronic back pain    Chronic idiopathic constipation    Chronic neck and back pain    Colon polyps    COPD (chronic obstructive pulmonary disease) with chronic bronchitis 09/16/2013   Office Spirometry 10/30/2013-submaximal effort based on appearance of loop and curve. Numbers would fit with severe restriction but her physiologic capability may be better than this. FVC 0.91/44%, and 10.74/45%, FEV1/FVC 0.81, FEF 25-75% 1.43/69%     Diabetes mellitus    Type II   Diverticulosis    TCS 9/08 by Dr. Delfin Edis for diarrhea . Bx for micro scopic colitis negative.  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   Metabolic encephalopathy 23/30/0762   Migraines    chronic headaches   Mononeuritis lower limb    Narcolepsy     Osteoporosis    Pancreatitis 2006   due to Depakote with normal EUS    Paralysis (HCC)    Schatzki's ring    non critical / EGD with ED 8/2011with RMR   Seizures ([place])    takes Lamictal daily.Last seizure 3 yrs ago   Sleep apnea    on CPAP   Small bowel obstruction (HCC)    Stroke ([place])    left sided weakness, speech changes   Tubular adenoma of colon     Past Surgical History:  Procedure Laterality Date   ABDOMINAL HYSTERECTOMY  1978   BACK SURGERY  July 2012   BACTERIAL OVERGROWTH TEST N/A 05/05/2013   Procedure: BACTERIAL OVERGROWTH TEST;  Surgeon: Daneil Dolin, MD;  [facility];  Service: Endoscopy;  Laterality: N/A;  7:30   BIOPSY THYROID  2009   BRAIN SURGERY  11/2011   resection of meningioma   BREAST REDUCTION SURGERY  1994   CARDIAC CATHETERIZATION  05/10/2005   normal coronaries, normal LV systolic function and EF (Dr. Jackie Plum)   [place] Left 07/22/04   Dr. Aline Brochure   CATARACT EXTRACTION Bilateral    CHOLECYSTECTOMY  1984   COLONOSCOPY N/A 09/25/2012   UQJ:[id number] diverticulosis.  colonic polyp-removed : tubular adenoma   CRANIOTOMY  11/23/2011   Procedure: CRANIOTOMY TUMOR EXCISION;  Surgeon: Hosie Spangle, MD;  Location: [place] NEURO ORS;  Service: Neurosurgery;  Laterality: N/A;  Craniotomy for tumor resection   ESOPHAGOGASTRODUODENOSCOPY  12/29/2010   [place]-Retained food in the esophagus and stomach, small hiatal hernia, status post Maloney dilation of the esophagus   ESOPHAGOGASTRODUODENOSCOPY N/A 09/25/2012   BWL:SLHTDSKA atonic baggy esophagus status post Maloney dilation 67 F. Hiatal hernia   GIVENS CAPSULE STUDY N/A 01/15/2013   NORMAL.    IR GENERIC HISTORICAL  03/17/2016   IR RADIOLOGIST EVAL & MGMT 03/17/2016 [facility]-INTERV RAD   LESION REMOVAL N/A 05/31/2015   Procedure: REMOVAL RIGHT AND LEFT LESIONS OF MANDIBLE;  Surgeon: Diona Browner, DDS;  Location: [place];  Service: Oral Surgery;  Laterality: N/A;   MALONEY DILATION  12/29/2010    RMR;   NM MYOCAR PERF WALL MOTION  2006   "relavtiely normal" persantine, mild anterior thinning (breast attenuation artifact), no region of scar/ischemia   OVARIAN CYST REMOVAL     RECTOCELE REPAIR N/A 06/29/2015   Procedure: POSTERIOR REPAIR (RECTOCELE);  Surgeon: Jonnie Kind, MD;  Location: [facility];  Service: Gynecology;  Laterality: N/A;   REDUCTION MAMMAPLASTY Bilateral    SPINE SURGERY  09/29/2010   Dr. Rolena Infante   surgical excision of 3 tumors from right thigh and right buttock  and left upper thigh  2010   TOOTH EXTRACTION Bilateral 12/14/2014   Procedure: REMOVAL  OF BILATERAL MANDIBULAR EXOSTOSES;  Surgeon: Diona Browner, DDS;  Location: [place];  Service: Oral Surgery;  Laterality: Bilateral;   TRANSTHORACIC ECHOCARDIOGRAM  2010   EF 60-65%, mild conc LVH, grade 1 diastolic dysfunction; mildly calcified MV annulus with mildly thickened leaflets, mildly calcified MR annulus    Family Psychiatric History: See below  Family History:  Family History  Problem Relation Age of Onset   Heart attack Mother        HTN   Pneumonia Father    Kidney failure Father    Diabetes Father    Pancreatic cancer Sister    Cancer Sister        breast    Cancer Sister        pancreatic   Diabetes Brother    Hypertension Brother    Diabetes Brother    Alcohol abuse Maternal Uncle    Stroke Maternal Grandmother    Heart attack Maternal Grandfather    Hypertension Son    Sleep apnea Son    Colon cancer Neg Hx    Anesthesia problems Neg Hx    Hypotension Neg Hx    Malignant hyperthermia Neg Hx    Pseudochol deficiency Neg Hx    Breast cancer Neg Hx    Stomach cancer Neg Hx     Social History:  Social History   Socioeconomic History   Marital status: Divorced    Spouse name: Not on file   Number of children: 1   Years of education: 12   Highest education level: High school graduate  Occupational History   Occupation: Disabled  Tobacco Use   Smoking status: Former    Packs/day:  0.25    Years: 30.00    Total pack years: 7.50    Types: Cigarettes    Quit date: 07/05/2021    Years since quitting: 0.7    Passive exposure: Past   Smokeless tobacco: Never   Tobacco comments:    3-4 a day MRC 05/17/2021  Vaping Use   Vaping Use: Never used  Substance and Sexual Activity   Alcohol use: No    Alcohol/week: 0.0 standard drinks of alcohol    Comment:     Drug use: No   Sexual activity: Not Currently  Other Topics Concern   Not on file  Social History Narrative   01/29/18 Lives alone, has 3 aides, Mon- Fri 8 hrs, 2 hrs on Sat-Sun, RN manages her meds   Caffeine use: Drink coffee sometimes    Right handed    Social Determinants of Health   Financial Resource Strain: Low Risk  (01/03/2022)   Overall Financial Resource Strain (CARDIA)    Difficulty of Paying Living Expenses: Not very hard  Food Insecurity: No Food Insecurity (01/03/2022)   Hunger Vital Sign    Worried About Running Out of Food in the Last Year: Never true    Ran Out of Food in the Last Year: Never true  Transportation Needs: No Transportation Needs (01/03/2022)   PRAPARE - Hydrologist (Medical): No    Lack of Transportation (Non-Medical): No  Physical Activity: Insufficiently Active (01/03/2022)   Exercise Vital Sign    Days of Exercise per Week: 2 days    Minutes of Exercise per Session: 20 min  Stress: No Stress Concern Present (01/03/2022)   [place]    Feeling of Stress : Only a little  Social Connections: Moderately Integrated (01/03/2022)  Social Connection and Isolation Panel [NHANES]    Frequency of Communication with Friends and Family: Three times a week    Frequency of Social Gatherings with Friends and Family: Once a week    Attends Religious Services: More than 4 times per year    Active Member of [employer] or Organizations: Yes    Attends Music therapist: More than 4  times per year    Marital Status: Divorced    Allergies:  Allergies  Allergen Reactions   Iron Nausea And Vomiting    And itching And itching   Milk ([place]) Rash    Doesn't agree with stomach.  Doesn't agree with stomach.  Doesn't agree with stomach.    Penicillins Hives    Has patient had a PCN reaction causing immediate rash, facial/tongue/throat swelling, SOB or lightheadedness with hypotension: Yes Has patient had a PCN reaction causing severe rash involving mucus membranes or skin necrosis: No Has patient had a PCN reaction that required hospitalization No Has patient had a PCN reaction occurring within the last 10 years: No If all of the above answers are "NO", then may proceed with Cephalosporin use.  Other reaction(s): Other (see comments) Has patient had a PCN reaction causing immediate rash, facial/tongue/throat swelling, SOB or lightheadedness with hypotension: Yes Has patient had a PCN reaction causing severe rash involving mucus membranes or skin necrosis: No Has patient had a PCN reaction that required hospitalization No Has patient had a PCN reaction occurring within the last 10 years: No If all of the above answers are "NO", then may proceed with Cephalosporin use. Has patient had a PCN reaction causing immediate rash, facial/tongue/throat swelling, SOB or lightheadedness with hypotension: Yes Has patient had a PCN reaction causing severe rash involving mucus membranes or skin necrosis: No Has patient had a PCN reaction that required hospitalization No Has patient had a PCN reaction occurring within the last 10 years: No If all of the above answers are "NO", then may proceed with Cephalosporin use. Has patient had a PCN reaction causing immediate rash, facial/tongue/throat swelling, SOB or lightheadedness with hypotension: Yes Has patient had a PCN reaction causing severe rash involving mucus membranes or skin necrosis: No Has patient had a PCN reaction that required  hospitalization No Has patient had a PCN reaction occurring within the last 10 years: No If all of the above answers are "NO", then may proceed with Cephalosporin use. Has patient had a PCN reaction causing immediate rash, facial/tongue/throat swelling, SOB or lightheadedness with hypotension: Yes Has patient had a PCN reaction causing sever... (TRUNCATED)   Phenazopyridine Hives   Phenazopyridine Hcl Hives   Cephalexin Hives   Flonase [Fluticasone]     "It gave me ulcers in my nose"   Milk-Related Compounds Other (See Comments)    Doesn't agree with stomach.    Phenazopyridine Hcl Hives          Metabolic Disorder Labs: Lab Results  Component Value Date   HGBA1C 5.7 (H) 12/23/2021   MPG 114.02 09/16/2021   MPG 108 09/30/2018   No results found for: "PROLACTIN" Lab Results  Component Value Date   CHOL 155 05/10/2021   TRIG 200 (H) 05/10/2021   HDL 42 05/10/2021   CHOLHDL 3.7 05/10/2021   VLDL 26 07/19/2015   LDLCALC 79 05/10/2021   LDLCALC 51 09/01/2020   Lab Results  Component Value Date   TSH 0.494 10/04/2021   TSH 0.710 09/16/2021    Therapeutic Level Labs: Lab Results  Component Value Date   LITHIUM <0.06 (L) 10/15/2016   Lab Results  Component Value Date   VALPROATE <10.0 (L) 08/23/2007   No results found for: "CBMZ"  Current Medications: Current Outpatient Medications  Medication Sig Dispense Refill   ACCU-CHEK GUIDE test strip TEST BLOOD SUGAR FOUR TIMES DAILY  AND AS NEEDED 450 strip 2   Accu-Chek Softclix Lancets lancets TEST BLOOD SUGAR THREE TIMES DAILY AS DIRECTED 300 each 2   acetaminophen (TYLENOL) 325 MG tablet Take 650 mg by mouth every 6 (six) hours as needed.     albuterol (PROVENTIL) (2.5 MG/3ML) 0.083% nebulizer solution Take 3 mLs (2.5 mg total) by nebulization every 6 (six) hours as needed for wheezing or shortness of breath. 75 mL 12   albuterol (PROVENTIL) (2.5 MG/3ML) 0.083% nebulizer solution Take 3 mLs (2.5 mg total) by nebulization  every 6 (six) hours as needed for wheezing or shortness of breath. (Patient not taking: Reported on 03/14/2022) 150 mL 2   albuterol (VENTOLIN HFA) 108 (90 Base) MCG/ACT inhaler INHALE 1 TO 2 PUFFS EVERY 6 HOURS AS NEEDED FOR WHEEZING, SHORTNESS OF BREATH (Patient taking differently: Inhale 1-2 puffs into the lungs every 6 (six) hours as needed for wheezing or shortness of breath.) 3 each 3   Alcohol Swabs (DROPSAFE ALCOHOL PREP) 70 % PADS USE TO CLEAN FINGER PRIOR TO TESTING FOR BLOOD SUGAR  AS DIRECTED 300 each 1   alendronate (FOSAMAX) 70 MG tablet TAKE 1 TABLET EVERY 7 DAYS ON AN EMPTY STOMACH WITH A FULL GLASS OF WATER (Patient taking differently: Take 70 mg by mouth once a week.) 12 tablet 3   amLODipine (NORVASC) 10 MG tablet TAKE 1 TABLET EVERY DAY (Patient taking differently: Take 10 mg by mouth daily.) 90 tablet 1   ascorbic acid (VITAMIN C) 500 MG tablet Take 500 mg by mouth daily.     aspirin EC 81 MG tablet Take 1 tablet (81 mg total) by mouth daily with breakfast. 120 tablet 2   benzonatate (TESSALON) 200 MG capsule Take 1 capsule (200 mg total) by mouth 3 (three) times daily as needed for cough. 40 capsule 0   Blood Glucose Calibration (ACCU-CHEK GUIDE CONTROL) LIQD USE AS DIRECTED 1 each 0   blood glucose meter kit and supplies Dispense based on patient and insurance preference. Use up to four times daily as directed. (FOR ICD-10 E10.9, E11.9). 1 each 0   buPROPion (WELLBUTRIN XL) 150 MG 24 hr tablet Take 1 tablet (150 mg total) by mouth every morning. 90 tablet 2   Calcium Carb-Cholecalciferol (CALTRATE 600+D3 SOFT) 600-20 MG-MCG CHEW Chew 1 tablet by mouth daily at 12 noon.     cetirizine (ZYRTEC) 10 MG tablet Take 1 tablet (10 mg total) by mouth daily. 30 tablet 1   Cholecalciferol (D3 PO) Take 1 tablet by mouth daily.     Continuous Blood Gluc Sensor (FREESTYLE LIBRE 14 DAY SENSOR) MISC 1 each by Does not apply route every 14 (fourteen) days. Change every 2 weeks (Patient not taking:  Reported on 03/20/2022) 2 each 11   Docusate Sodium (DSS) 100 MG CAPS Take 1 tablet by mouth in the morning and at bedtime.     doxycycline (VIBRA-TABS) 100 MG tablet Take 1 tablet (100 mg total) by mouth 2 (two) times daily. 14 tablet 0   DROPLET PEN NEEDLES 31G X 8 MM MISC USE FOR INJECTING INSULIN 4 TIMES DAILY. 400 each 0   ezetimibe (ZETIA) 10 MG tablet TAKE 1 TABLET EVERY  DAY 90 tablet 10   insulin aspart (NOVOLOG FLEXPEN) 100 UNIT/ML FlexPen Inject 5 Units into the skin 3 (three) times daily with meals. Give if eats 50% or more of meal. (Patient taking differently: Inject 14 Units into the skin 3 (three) times daily with meals. Give if eats 50% or more of meal.) 60 mL 0   insulin glargine, 2 Unit Dial, (TOUJEO MAX SOLOSTAR) 300 UNIT/ML Solostar Pen Inject 20-26 Units into the skin daily. 12 mL 0   ipratropium (ATROVENT) 0.03 % nasal spray Place 2 sprays into both nostrils every 12 (twelve) hours. 30 mL 12   lactobacillus acidophilus (BACID) TABS tablet Take 2 tablets by mouth 3 (three) times daily.     lamoTRIgine (LAMICTAL) 100 MG tablet Take 1 tablet (100 mg total) by mouth 2 (two) times daily. 180 tablet 2   levothyroxine (SYNTHROID) 50 MCG tablet TAKE 1 TABLET DAILY SIX DAYS A WEEK AND 1/2 TABLET ON ONE DAY A WEEK 85 tablet 2   LORazepam (ATIVAN) 0.5 MG tablet Take 1 tablet (0.5 mg total) by mouth 2 (two) times daily. 60 tablet 0   metoprolol tartrate (LOPRESSOR) 50 MG tablet TAKE 1 TABLET TWICE DAILY (NEED MD APPOINTMENT) (Patient taking differently: Take 50 mg by mouth 2 (two) times daily.) 180 tablet 3   Misc. Devices (MATTRESS PAD) MISC FIRM MATTRESS PAD for hospital bed x 1 1 each 0   montelukast (SINGULAIR) 10 MG tablet TAKE 1 TABLET EVERY DAY (Patient taking differently: Take 10 mg by mouth at bedtime.) 90 tablet 1   Multiple Vitamins-Minerals (MULTIVITAMIN GUMMIES ADULT PO) Take 1 tablet by mouth daily.     MYRBETRIQ 25 MG TB24 tablet TAKE 1 TABLET EVERY DAY 90 tablet 1   Omega-3  Fatty Acids (FISH OIL PO) Take 1 capsule by mouth daily.     oxyCODONE-acetaminophen (PERCOCET) 10-325 MG tablet Take 1 tablet by mouth every 8 (eight) hours as needed for pain.     potassium chloride (KLOR-CON M) 10 MEQ tablet Take 1 tablet (10 mEq total) by mouth 2 (two) times daily. (Patient not taking: Reported on 03/20/2022) 60 tablet 1   potassium chloride (KLOR-CON) 10 MEQ tablet Take 1 tablet (10 mEq total) by mouth 2 (two) times daily. 30 tablet 2   predniSONE (DELTASONE) 20 MG tablet Take 1 tablet (20 mg total) by mouth daily. (Patient not taking: Reported on 03/14/2022) 4 tablet 0   pregabalin (LYRICA) 75 MG capsule Take 1 capsule (75 mg total) by mouth daily.     RABEprazole (ACIPHEX) 20 MG tablet TAKE 1 TABLET TWICE DAILY 180 tablet 2   RESTASIS 0.05 % ophthalmic emulsion Place 2 drops into both eyes daily.     risperiDONE (RISPERDAL) 0.5 MG tablet Take 1 tablet (0.5 mg total) by mouth at bedtime. 90 tablet 2   rosuvastatin (CRESTOR) 5 MG tablet TAKE 1 TABLET AT BEDTIME. 90 tablet 1   sertraline (ZOLOFT) 100 MG tablet Take 1 tablet (100 mg total) by mouth every morning. 90 tablet 2   SPIKEVAX injection      spironolactone (ALDACTONE) 50 MG tablet Take 50 mg by mouth daily.     STIOLTO RESPIMAT 2.5-2.5 MCG/ACT AERS INHALE 2 PUFFS INTO THE LUNGS DAILY. 12 g 1   telmisartan (MICARDIS) 20 MG tablet Take 10 mg by mouth daily.     tizanidine (ZANAFLEX) 2 MG capsule Take 1 capsule (2 mg total) by mouth 3 (three) times daily. Do not drink alcohol or drive while taking this  medication.  May cause drowsiness. 15 capsule 0   traZODone (DESYREL) 150 MG tablet Take 1 tablet (150 mg total) by mouth at bedtime. 90 tablet 2   vitamin E 180 MG (400 UNITS) capsule Take 400 Units by mouth daily.     No current facility-administered medications for this visit.     Musculoskeletal: Strength & Muscle Tone: within normal limits Gait & Station: normal Patient leans: N/A  Psychiatric Specialty  Exam: Review of Systems  Musculoskeletal:  Positive for arthralgias and joint swelling.  All other systems reviewed and are negative.   There were no vitals taken for this visit.There is no height or weight on file to calculate BMI.  General Appearance: NA  Eye Contact:  NA  Speech:  Clear and Coherent  Volume:  Normal  Mood:  Anxious and Euthymic  Affect:  NA  Thought Process:  Goal Directed  Orientation:  Full (Time, Place, and Person)  Thought Content: Rumination   Suicidal Thoughts:  No  Homicidal Thoughts:  No  Memory:  Immediate;   Good Recent;   Fair Remote;   Fair  Judgement:  Fair  Insight:  Fair  Psychomotor Activity:  Decreased  Concentration:  Concentration: Fair and Attention Span: Fair  Recall:  Good  Fund of Knowledge: Good  Language: Good  Akathisia:  No  Handed:  Right  AIMS (if indicated): not done  Assets:  Communication Skills Desire for Improvement Resilience Social Support  ADL's:  Intact  Cognition: wnls  Sleep:  Fair   Screenings: GAD-7    [place] Office Visit from 03/09/2022 in [facility]  Total GAD-7 Score [place] Office Visit from 11/24/2020 in [facility] Primary Care Office Visit from 02/03/2019 in [facility] Primary Care Office Visit from 09/08/2015 in [facility] Neurology Office Visit from 07/02/2014 in [facility] Neurology Office Visit from 12/08/2013 in [facility] Neurology  Total Score (max 30 points ) '23 27 24 26 27      '$ PHQ2-9    [place] Visit from 03/09/2022 in [facility] Primary Care Counselor from 02/20/2022 in [place] [facility] [place] Visit from 01/31/2022 in [facility] Primary Care Chronic Care Management from 01/03/2022 in [facility] Primary Care Office Visit from 12/20/2021 in [place] Primary Care  PHQ-2 Total Score  '2 2 1 '$ 0 1  PHQ-9 Total Score 7 14 -- -- 6      Flowsheet Row Counselor from 02/20/2022 in [place] at [place] ED from 12/16/2021 in [facility] [place] Video Visit from 12/07/2021 in [place] [facility] [place] No Risk No Risk        Assessment and Plan: This patient is a 72 year old female with a history of depression anxiety and mood swings.  She seems to be doing fairly well on her current regimen.  She will continue trazodone 150 mg at bedtime for sleep, Wellbutrin XL 150 mg daily as well as Zoloft 100 mg daily for depression, Lamictal 100 mg twice daily for mood stabilization and Ativan 0.5 mg twice daily for anxiety.  She will return to see me in 3 months  Collaboration of Care: Collaboration of Care: Primary Care Provider AEB notes are shared with PCP through the epic system  Patient/Guardian was advised Release  of Information must be obtained prior to any record release in order to collaborate their care with an outside provider. Patient/Guardian was advised if they have not already done so to contact the registration department to sign all necessary forms in order for [name] to release information regarding their care.   Consent: Patient/Guardian gives verbal consent for treatment and assignment of benefits for services provided during this visit. Patient/Guardian expressed understanding and agreed to proceed.    Levonne Spiller, MD 04/03/2022, 1:55 PM

## 2022-04-04 ENCOUNTER — Telehealth: Payer: Self-pay | Admitting: Family Medicine

## 2022-04-04 NOTE — Telephone Encounter (Addendum)
Patient called in regard to LOSARTAN '50MG'$    Has received letter in mail from  [employer] stating that med is not covered.   May require pre auth .   Patient will bring in letter on next appt. 04/13/22   Patient wants a call back in regard also wants to speak with provider about a few personal issues wants call back to patient only

## 2022-04-07 ENCOUNTER — Other Ambulatory Visit: Payer: Self-pay | Admitting: Radiation Therapy

## 2022-04-10 ENCOUNTER — Ambulatory Visit (HOSPITAL_COMMUNITY): Payer: Medicare HMO | Admitting: Psychiatry

## 2022-04-11 ENCOUNTER — Other Ambulatory Visit: Payer: Self-pay | Admitting: Physician Assistant

## 2022-04-12 DIAGNOSIS — G5601 Carpal tunnel syndrome, right upper limb: Secondary | ICD-10-CM | POA: Diagnosis not present

## 2022-04-12 DIAGNOSIS — M65331 Trigger finger, right middle finger: Secondary | ICD-10-CM | POA: Diagnosis not present

## 2022-04-12 DIAGNOSIS — I10 Essential (primary) hypertension: Secondary | ICD-10-CM

## 2022-04-12 DIAGNOSIS — E1143 Type 2 diabetes mellitus with diabetic autonomic (poly)neuropathy: Secondary | ICD-10-CM

## 2022-04-12 DIAGNOSIS — M79641 Pain in right hand: Secondary | ICD-10-CM | POA: Diagnosis not present

## 2022-04-13 ENCOUNTER — Other Ambulatory Visit (HOSPITAL_COMMUNITY): Payer: Self-pay | Admitting: Psychiatry

## 2022-04-13 ENCOUNTER — Ambulatory Visit (HOSPITAL_COMMUNITY): Payer: Medicare HMO | Admitting: Psychiatry

## 2022-04-13 ENCOUNTER — Ambulatory Visit (INDEPENDENT_AMBULATORY_CARE_PROVIDER_SITE_OTHER): Payer: Medicare HMO | Admitting: Family Medicine

## 2022-04-13 ENCOUNTER — Encounter: Payer: Self-pay | Admitting: Family Medicine

## 2022-04-13 VITALS — BP 135/67 | HR 75 | Ht 59.0 in | Wt 166.1 lb

## 2022-04-13 DIAGNOSIS — J449 Chronic obstructive pulmonary disease, unspecified: Secondary | ICD-10-CM | POA: Diagnosis not present

## 2022-04-13 DIAGNOSIS — I10 Essential (primary) hypertension: Secondary | ICD-10-CM

## 2022-04-13 DIAGNOSIS — G4733 Obstructive sleep apnea (adult) (pediatric): Secondary | ICD-10-CM | POA: Diagnosis not present

## 2022-04-13 DIAGNOSIS — Z9189 Other specified personal risk factors, not elsewhere classified: Secondary | ICD-10-CM

## 2022-04-13 DIAGNOSIS — K219 Gastro-esophageal reflux disease without esophagitis: Secondary | ICD-10-CM

## 2022-04-13 NOTE — Progress Notes (Signed)
[name]     MRN: [id number]      DOB: May 14, 1950   HPI [name] is here for follow up and re-evaluation of chronic medical conditions, medication management and review of any available recent lab and radiology data.  Preventive health is updated, specifically  Cancer screening and Immunization.   Questions or concerns regarding consultations or procedures which the PT has had in the interim are  addressed. The PT denies any adverse reactions to current medications since the last visit.  C/o chest tightness when she vacuums, took 70 mins to make up  her bed last night worse over past 6 months ROS Denies recent fever or chills. Denies sinus pressure, nasal congestion, ear pain or sore throat. Denies chest congestion, productive cough or wheezing. Denies  palpitations and leg swelling Denies abdominal pain, nausea, vomiting,diarrhea or constipation.   Denies dysuria, frequency, hesitancy or incontinence. Chronic joint pain, and limitation in mobility. Denies uncontrolled headaches, seizures, chronic numbness, or tingling. Chronic depression, anxiety or insomnia. Denies skin break down or rash.   PE  BP 135/67 (BP Location: Right Arm, Patient Position: Sitting, Cuff Size: Large)   Pulse 75   Ht '4\' 11"'$  (1.499 m)   Wt 166 lb 1.3 oz (75.3 kg)   SpO2 94%   BMI 33.54 kg/m   Patient alert and oriented and in no cardiopulmonary distress.  HEENT: No facial asymmetry, EOMI,     Neck decreased ROM .  Chest: Clear to auscultation bilaterally.Decreased air entry  CVS: S1, S2 no murmurs, no S3.Regular rate.  ABD: Soft non tender.   Ext: No edema  MS: decreased  ROM spine, shoulders, hips and knees.  Skin: Intact, no ulcerations or rash noted.  Psych: Good eye contact, flat affect. Memory impaired, both anxious or depressed appearing.  CNS: CN 2-12 intact, power,  normal throughout.no focal deficits noted.   Assessment & Plan  Essential hypertension Controlled , no change  in meds  Obstructive sleep apnea Managed by pulmonary and compliant  GERD Controlled, no change in medication   DMII (diabetes mellitus, type 2) (HCC) Controlled, managed by Endo [name] is reminded of the importance of commitment to daily physical activity for 30 minutes or more, as able and the need to limit carbohydrate intake to 30 to 60 grams per meal to help with blood sugar control.   The need to take medication as prescribed, test blood sugar as directed, and to call between visits if there is a concern that blood sugar is uncontrolled is also discussed.   [name] is reminded of the importance of daily foot exam, annual eye examination, and good blood sugar, blood pressure and cholesterol control.     Latest Ref Rng & Units 03/09/2022    2:18 PM 01/27/2022    9:36 AM 12/23/2021   11:43 AM 11/20/2021   11:18 AM 11/09/2021   11:36 AM  Diabetic Labs  HbA1c 4.8 - 5.6 %   5.7     Micro/Creat Ratio 0 - 29 mg/g creat 41       Creatinine 0.44 - 1.00 mg/dL  1.10  1.35  0.93  0.96       04/13/2022    1:33 PM 03/28/2022    9:19 AM 03/14/2022    1:50 PM 03/09/2022    1:33 PM 03/09/2022    1:14 PM 03/09/2022    1:11 PM 02/21/2022   10:15 AM  BP/Weight  Systolic BP 381 829 937 169 167 161  290  Diastolic BP 67 57 68 70 73 74 45  Wt. (Lbs) 166.08  166.4   165.12   BMI 33.54 kg/m2  33.61 kg/m2   30.2 kg/m2       Latest Ref Rng & Units 05/26/2020   12:00 AM 01/01/2019    1:40 PM  Foot/eye exam completion dates  Eye Exam No Retinopathy No Retinopathy       Foot Form Completion   Done     This result is from an external source.

## 2022-04-13 NOTE — Patient Instructions (Addendum)
F/U in 3.5 months, call if you need me sooner  Blood pressure is excellent today  You are referred to cardiology because of recurrent chest pain and poor exercise tolerance in the past several months.  You are referred to neurology because of imbalance and tremor.  Please bring medications to next visit.  Please be careful not to fall.  I still do recommend that you have your medications.packed and delivered on a monthly basis  Fasting lipid, cmp and EGFR and tSH and vit d in next 1 to 2 weeks  Thanks for choosing [facility], we consider it a privelige to serve you.'

## 2022-04-16 ENCOUNTER — Encounter: Payer: Self-pay | Admitting: Family Medicine

## 2022-04-16 NOTE — Assessment & Plan Note (Addendum)
Controlled, managed by Endo [name] is reminded of the importance of commitment to daily physical activity for 30 minutes or more, as able and the need to limit carbohydrate intake to 30 to 60 grams per meal to help with blood sugar control.   The need to take medication as prescribed, test blood sugar as directed, and to call between visits if there is a concern that blood sugar is uncontrolled is also discussed.   [name] is reminded of the importance of daily foot exam, annual eye examination, and good blood sugar, blood pressure and cholesterol control.     Latest Ref Rng & Units 03/09/2022    2:18 PM 01/27/2022    9:36 AM 12/23/2021   11:43 AM 11/20/2021   11:18 AM 11/09/2021   11:36 AM  Diabetic Labs  HbA1c 4.8 - 5.6 %   5.7     Micro/Creat Ratio 0 - 29 mg/g creat 41       Creatinine 0.44 - 1.00 mg/dL  1.10  1.35  0.93  0.96       04/13/2022    1:33 PM 03/28/2022    9:19 AM 03/14/2022    1:50 PM 03/09/2022    1:33 PM 03/09/2022    1:14 PM 03/09/2022    1:11 PM 02/21/2022   10:15 AM  BP/Weight  Systolic BP 102 725 366 440 347 425 956  Diastolic BP 67 57 68 70 73 74 45  Wt. (Lbs) 166.08  166.4   165.12   BMI 33.54 kg/m2  33.61 kg/m2   30.2 kg/m2       Latest Ref Rng & Units 05/26/2020   12:00 AM 01/01/2019    1:40 PM  Foot/eye exam completion dates  Eye Exam No Retinopathy No Retinopathy       Foot Form Completion   Done     This result is from an external source.

## 2022-04-16 NOTE — Assessment & Plan Note (Signed)
Managed by pulmonary and compliant

## 2022-04-16 NOTE — Assessment & Plan Note (Signed)
Controlled , no change in meds

## 2022-04-16 NOTE — Assessment & Plan Note (Signed)
Controlled, no change in medication  

## 2022-04-17 DIAGNOSIS — D329 Benign neoplasm of meninges, unspecified: Secondary | ICD-10-CM | POA: Diagnosis not present

## 2022-04-18 ENCOUNTER — Other Ambulatory Visit: Payer: Self-pay | Admitting: Family Medicine

## 2022-04-18 DIAGNOSIS — E1122 Type 2 diabetes mellitus with diabetic chronic kidney disease: Secondary | ICD-10-CM | POA: Diagnosis not present

## 2022-04-18 DIAGNOSIS — Z794 Long term (current) use of insulin: Secondary | ICD-10-CM | POA: Diagnosis not present

## 2022-04-18 DIAGNOSIS — E1142 Type 2 diabetes mellitus with diabetic polyneuropathy: Secondary | ICD-10-CM | POA: Diagnosis not present

## 2022-04-18 DIAGNOSIS — N1832 Chronic kidney disease, stage 3b: Secondary | ICD-10-CM | POA: Diagnosis not present

## 2022-04-18 DIAGNOSIS — J449 Chronic obstructive pulmonary disease, unspecified: Secondary | ICD-10-CM | POA: Diagnosis not present

## 2022-04-18 DIAGNOSIS — D631 Anemia in chronic kidney disease: Secondary | ICD-10-CM | POA: Diagnosis not present

## 2022-04-18 DIAGNOSIS — I509 Heart failure, unspecified: Secondary | ICD-10-CM | POA: Diagnosis not present

## 2022-04-18 DIAGNOSIS — F250 Schizoaffective disorder, bipolar type: Secondary | ICD-10-CM | POA: Diagnosis not present

## 2022-04-18 DIAGNOSIS — I130 Hypertensive heart and chronic kidney disease with heart failure and stage 1 through stage 4 chronic kidney disease, or unspecified chronic kidney disease: Secondary | ICD-10-CM | POA: Diagnosis not present

## 2022-04-19 DIAGNOSIS — H524 Presbyopia: Secondary | ICD-10-CM | POA: Diagnosis not present

## 2022-04-19 DIAGNOSIS — H52223 Regular astigmatism, bilateral: Secondary | ICD-10-CM | POA: Diagnosis not present

## 2022-04-20 ENCOUNTER — Encounter: Payer: Self-pay | Admitting: Internal Medicine

## 2022-04-20 ENCOUNTER — Telehealth: Payer: Self-pay | Admitting: Family Medicine

## 2022-04-20 ENCOUNTER — Ambulatory Visit (INDEPENDENT_AMBULATORY_CARE_PROVIDER_SITE_OTHER): Payer: Medicare HMO | Admitting: Internal Medicine

## 2022-04-20 VITALS — BP 120/72 | HR 66 | Ht 59.0 in | Wt 164.4 lb

## 2022-04-20 DIAGNOSIS — E1165 Type 2 diabetes mellitus with hyperglycemia: Secondary | ICD-10-CM | POA: Diagnosis not present

## 2022-04-20 DIAGNOSIS — E1122 Type 2 diabetes mellitus with diabetic chronic kidney disease: Secondary | ICD-10-CM | POA: Diagnosis not present

## 2022-04-20 DIAGNOSIS — I130 Hypertensive heart and chronic kidney disease with heart failure and stage 1 through stage 4 chronic kidney disease, or unspecified chronic kidney disease: Secondary | ICD-10-CM | POA: Diagnosis not present

## 2022-04-20 DIAGNOSIS — Z794 Long term (current) use of insulin: Secondary | ICD-10-CM | POA: Diagnosis not present

## 2022-04-20 DIAGNOSIS — E042 Nontoxic multinodular goiter: Secondary | ICD-10-CM

## 2022-04-20 DIAGNOSIS — N1832 Chronic kidney disease, stage 3b: Secondary | ICD-10-CM | POA: Diagnosis not present

## 2022-04-20 DIAGNOSIS — I509 Heart failure, unspecified: Secondary | ICD-10-CM | POA: Diagnosis not present

## 2022-04-20 DIAGNOSIS — D631 Anemia in chronic kidney disease: Secondary | ICD-10-CM | POA: Diagnosis not present

## 2022-04-20 DIAGNOSIS — E039 Hypothyroidism, unspecified: Secondary | ICD-10-CM | POA: Diagnosis not present

## 2022-04-20 DIAGNOSIS — E1142 Type 2 diabetes mellitus with diabetic polyneuropathy: Secondary | ICD-10-CM | POA: Diagnosis not present

## 2022-04-20 DIAGNOSIS — J449 Chronic obstructive pulmonary disease, unspecified: Secondary | ICD-10-CM | POA: Diagnosis not present

## 2022-04-20 DIAGNOSIS — F250 Schizoaffective disorder, bipolar type: Secondary | ICD-10-CM | POA: Diagnosis not present

## 2022-04-20 LAB — POCT GLYCOSYLATED HEMOGLOBIN (HGB A1C): Hemoglobin A1C: 5.5 % (ref 4.0–5.6)

## 2022-04-20 NOTE — Progress Notes (Signed)
Patient ID: LINDER PRAJAPATI, female   DOB: 1950/04/04, 72 y.o.   MRN: [id number]  HPI: AURILLA COULIBALY is a 72 y.o.-year-old female, initially referred by her PCP, [name], presenting for follow-up for DM2, dx 1995, insulin-dependent since 1997, uncontrolled, with complications (gastroparesis, cerebrovasc. Ds-  H/o stroke, PN),  Hypothyroidism, thyroid nodules.  Last visit 4 months ago.  Interim history: She is  still on steroid injections in her shoulder and sugars were high.  She is seeing pain medicine. She had 3 injections since last OV. She also continues to have neck pain, headaches, disequilibrium.  She finished physical therapy. She has blurry vision but no chest pain, palpitations, or increased urination.  DM2: Reviewed HbA1c levels: Lab Results  Component Value Date   HGBA1C 5.7 (H) 12/23/2021   HGBA1C 5.5 09/19/2021   HGBA1C 5.6 09/16/2021  10/28/2013: HbA1c 8.5%  She is on: - Toujeo 20 units daily (25 units during steroid treatment) - Novolog  14 units for smaller meals (17 units during steroid treatment) 17 units for large meals  (20 units during steroid treatment) Could not tolerate Metformin >> nausea.   Insulin doses were decreased in 04/2017 due to low blood sugars in the 40s. Nephrology started [place] but stopped "it did not agree with me".  Pt checks her sugars 4 times a day: - am:  120-174, 250, 300 with steroids >> 140-170, 200s >> 103-159 - 2h after b'fast: n/c >> 110 >> n/c - before lunch: 103-171, 200-385 (high with steroids) >> 89-192 >> 140-155 >> 134 -150s - 2h after lunch:  n/c >> 127, 215, 241 >> n/c - before dinner:  91-176 (266-401) (high with steroids) >> 120-130 >> 140-155 >> 127-140 - 2h after dinner: n/c - bedtime: 60, 104-173 >> 83-190, 194-414 >> 99-100s >> 200s, 325 - steroids >> n/c - nighttime: n/c >> 134 >> n/c Lowest sugar was 64 >> 40s >> 89 >> 30s (slept and delayed meals, not eating well) >> 103; she has hypoglycemia awareness in the  70s. Highest sugar was 542 (steroid inj) ... >> 414 (steroid) >> 300s >> 300s (steroids) >> 300 occasionally.  Pt's meals are: - Breakfast: eggs, cereals, fruit, oatmeal, bacon - Lunch: sandwich, beef, mashed potatoes,diet soda - Dinner: chicken, beef, fish, potatoes, vegetables - Snacks: 1-2: fruit  -+ Mild CKD; last BUN/creatinine:  Lab Results  Component Value Date   BUN 22 01/27/2022   CREATININE 1.10 (H) 01/27/2022  On losartan.  -+ HL; last set of lipids: Lab Results  Component Value Date   CHOL 155 05/10/2021   HDL 42 05/10/2021   LDLCALC 79 05/10/2021   TRIG 200 (H) 05/10/2021   CHOLHDL 3.7 05/10/2021  On Crestor and Zetia.  - last eye exam was in 05/2021: + DR; Had cataract sx B. Dr. Hassell Done [facility]). + L eye pain.  -She has numbness and tingling mainly in the left leg-affected by stroke. Sees podiatry.  She had foot exam by podiatry (Dr. Elisha Ponder) 12/06/2021.  Hypothyroidism: -Diagnosed many years ago  She takes levothyroxine 50 mcg daily: - in am - fasting - at least 30 min from b'fast - no calcium - no iron - + multivitamins with lunch  - + Aciphex -moved later in the day - no PPIs - +on Biotin and B complex  Reviewed her TFTs: Lab Results  Component Value Date   TSH 0.494 10/04/2021   TSH 0.710 09/16/2021   TSH 0.85 05/19/2021   TSH 0.631 12/11/2019   TSH  0.94 02/17/2019   Thyroid nodules.    Pt denies: - feeling nodules in neck - hoarseness But she continues to have chronic dysphagia with certain foods.  She had previous esophageal dilations. She also has choking.  I reviewed previous imaging and biopsy tests: FNA (2014) x2: Benign  thyroid U/S (10/2013): Right thyroid lobe: 3.7 x 1.0 x 1.8 cm  Left thyroid lobe: 3.5 x 1.5 x 1.6 cm  Isthmus: 0.5 cm  Focal nodules:  There is a 0.3 mm calcified nodule in the right midzone.  There is a 0.6 x 0.7 x 0.5 cm hypoechoic nodule in the  lateral aspect of the right midzone.  There is a 0.7 x 0.6 x  0.5 cm hyperechoic nodule in the lateral aspect of the right lower pole.  There is a 2.2 x 1.1 x 1.7 cm complex solid nodule in the inferior aspect of the isthmus extending adjacent to the left lower pole.  There is a 1.4 x 2.1 x 1.4 cm complex solid nodule in the left lower pole.  Lymphadenopathy: None visualized.  Repeat U/S (01/2014): Stable appearance of the nodules  Repeat U/S (10/2015): stable appearance of the nodules  Repeat U/S (10/2016): Stable appearance of the nodules  Repeat U/S (02/02/2021): Stable appearance of nodules  She had a L rib fracture in 12/2014. She had a R ankle fracture 02/20/2020 >> bone stimulator to help with healing. Patient was admitted with AMS + sepsis on 10/15/2016.  She had aspiration pneumonia and acute respiratory failure She has a recurrent meningioma >> seeing Dr. Rita Ohara. She was admitted 09/15/2021 after a fall, and found to have dehydration, AKI and hyperkalemia (6.2) -considered the reason for her weakness and subsequent fall.   She is on Fosamax. She has a h/o pancreatitis.   ROS: + see HPI  I reviewed pt's medications, allergies, PMH, social hx, family hx, and changes were documented in the history of present illness. Otherwise, unchanged from my initial visit note.  Past Medical History:  Diagnosis Date   Allergy    Anemia    Anemia in chronic kidney disease (CKD) 08/10/2021   Anemia in chronic kidney disease (CKD) 10/12/2021   Anxiety    takes Ativan daily   Arthritis    Assistance needed for mobility    Bipolar disorder ([place])    takes Risperdal nightly   Blood transfusion    Brain tumor ([place])    Cancer ([place])    In her gum   Carpal tunnel syndrome of right wrist 05/23/2011   Cervical disc disorder with radiculopathy of cervical region 10/31/2012   Chronic back pain    Chronic idiopathic constipation    Chronic neck and back pain    Colon polyps    COPD (chronic obstructive pulmonary disease) with chronic bronchitis  09/16/2013   Office Spirometry 10/30/2013-submaximal effort based on appearance of loop and curve. Numbers would fit with severe restriction but her physiologic capability may be better than this. FVC 0.91/44%, and 10.74/45%, FEV1/FVC 0.81, FEF 25-75% 1.43/69%     Diabetes mellitus    Type II   Diverticulosis    TCS 9/08 by Dr. Delfin Edis for diarrhea . Bx for micro scopic colitis negative.    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   Metabolic encephalopathy 76/16/0737   Migraines    chronic headaches   Mononeuritis lower limb    Narcolepsy    Osteoporosis    Pancreatitis 2006   due to Depakote with normal EUS    Paralysis (HCC)    Schatzki's ring    non critical / EGD with ED 8/2011with RMR   Seizures ([place])    takes Lamictal daily.Last seizure 3 yrs ago   Sleep apnea    on CPAP   Small bowel obstruction (HCC)    Stroke ([place])    left sided weakness, speech changes   Tubular adenoma of colon    Past Surgical History:  Procedure Laterality Date   ABDOMINAL HYSTERECTOMY  1978   BACK SURGERY  July 2012   BACTERIAL OVERGROWTH TEST N/A 05/05/2013   Procedure: BACTERIAL OVERGROWTH TEST;  Surgeon: Daneil Dolin, MD;  [facility];  Service: Endoscopy;  Laterality: N/A;  7:30   BIOPSY THYROID  2009   BRAIN SURGERY  11/2011   resection of meningioma   BREAST REDUCTION SURGERY  1994   CARDIAC CATHETERIZATION  05/10/2005   normal coronaries, normal LV systolic function and EF (Dr. Jackie Plum)   [place] Left 07/22/04   Dr. Aline Brochure   CATARACT EXTRACTION Bilateral    CHOLECYSTECTOMY  1984   COLONOSCOPY N/A 09/25/2012   [id number] diverticulosis.  colonic polyp-removed : tubular adenoma   CRANIOTOMY  11/23/2011   Procedure: CRANIOTOMY TUMOR EXCISION;  Surgeon: Hosie Spangle, MD;  Location: [place] [facility]URO ORS;  Service: Neurosurgery;  Laterality: N/A;  Craniotomy for tumor resection   ESOPHAGOGASTRODUODENOSCOPY  12/29/2010   [place]-Retained food in the esophagus and stomach, small hiatal hernia, status post Maloney dilation of the esophagus   ESOPHAGOGASTRODUODENOSCOPY N/A 09/25/2012   [id number] atonic baggy esophagus status post Maloney dilation 83 F. Hiatal hernia   GIVENS CAPSULE STUDY N/A 01/15/2013   NORMAL.    IR GENERIC HISTORICAL  03/17/2016   IR RADIOLOGIST EVAL & MGMT 03/17/2016 [facility] RAD   LESION REMOVAL N/A 05/31/2015   Procedure: REMOVAL RIGHT AND LEFT LESIONS OF MANDIBLE;  Surgeon: Diona Browner, DDS;  Location: [place] [facility];  Service: Oral Surgery;  Laterality: N/A;   MALONEY DILATION  12/29/2010   RMR;   NM MYOCAR PERF WALL MOTION  2006   "relavtiely normal" persantine, mild anterior thinning (breast attenuation artifact), no region of scar/ischemia   OVARIAN CYST REMOVAL     RECTOCELE REPAIR N/A 06/29/2015   Procedure: POSTERIOR REPAIR (RECTOCELE);  Surgeon: Jonnie Kind, MD;  Location: [facility];  Service: Gynecology;  Laterality: N/A;   REDUCTION MAMMAPLASTY Bilateral    SPINE SURGERY  09/29/2010   Dr. Rolena Infante   surgical excision of 3 tumors from right thigh and right buttock  and left upper thigh  2010   TOOTH EXTRACTION Bilateral 12/14/2014   Procedure: REMOVAL OF BILATERAL MANDIBULAR EXOSTOSES;  Surgeon: Diona Browner, DDS;  Location: [facility];  Service: Oral Surgery;  Laterality: Bilateral;   TRANSTHORACIC ECHOCARDIOGRAM  2010   EF 60-65%, mild conc LVH, grade 1 diastolic dysfunction; mildly calcified MV annulus with mildly thickened leaflets, mildly calcified MR annulus  History   Social History   Marital Status: Divorced    Spouse Name: N/A    Number of Children: 1   Years of Education: 12   Occupational History   disabled     Social History Main Topics   Smoking status: Current Every Day Smoker -- 0.25 packs/day for 7 years    Types: Cigarettes   Smokeless tobacco: Never Used     Comment: "started back but off now for 3 months" (08/18/13)   Alcohol Use: No     Comment:     Drug Use: No   Current Outpatient Medications on File Prior to Visit  Medication Sig Dispense Refill   ACCU-CHEK GUIDE test strip TEST BLOOD SUGAR FOUR TIMES DAILY  AND AS NEEDED 450 strip 2   Accu-Chek Softclix Lancets lancets TEST BLOOD SUGAR THREE TIMES DAILY AS DIRECTED 300 each 2   acetaminophen (TYLENOL) 325 MG tablet Take 650 mg by mouth every 6 (six) hours as needed.     albuterol (PROVENTIL) (2.5 MG/3ML) 0.083% nebulizer solution Take 3 mLs (2.5 mg total) by nebulization every 6 (six) hours as needed for wheezing or shortness of breath. 75 mL 12   albuterol (VENTOLIN HFA) 108 (90 Base) MCG/ACT inhaler INHALE 1 TO 2 PUFFS EVERY 6 HOURS AS NEEDED FOR WHEEZING, SHORTNESS OF BREATH (Patient taking differently: Inhale 1-2 puffs into the lungs every 6 (six) hours as needed for wheezing or shortness of breath.) 3 each 3   Alcohol Swabs (DROPSAFE ALCOHOL PREP) 70 % PADS USE TO CLEAN FINGER PRIOR TO TESTING FOR BLOOD SUGAR  AS DIRECTED 300 each 1   alendronate (FOSAMAX) 70 MG tablet TAKE 1 TABLET EVERY 7 DAYS ON AN EMPTY STOMACH WITH A FULL GLASS OF WATER (Patient taking differently: Take 70 mg by mouth once a week.) 12 tablet 3   amLODipine (NORVASC) 10 MG tablet TAKE 1 TABLET EVERY DAY 60 tablet 0   ascorbic acid (VITAMIN C) 500 MG tablet Take 500 mg by mouth daily.     aspirin EC 81 MG tablet Take 1 tablet (81 mg total) by mouth daily with breakfast. 120 tablet 2   benzonatate (TESSALON) 200 MG capsule Take 1 capsule (200 mg total) by mouth 3 (three) times daily as needed for  cough. 40 capsule 0   Blood Glucose Calibration (ACCU-CHEK GUIDE CONTROL) LIQD USE AS DIRECTED 1 each 0   blood glucose meter kit and supplies Dispense based on patient and insurance preference. Use up to four times daily as directed. (FOR ICD-10 E10.9, E11.9). 1 each 0   buPROPion (WELLBUTRIN XL) 150 MG 24 hr tablet Take 1 tablet (150 mg total) by mouth every morning. 90 tablet 2   Calcium Carb-Cholecalciferol (CALTRATE 600+D3 SOFT) 600-20 MG-MCG CHEW Chew 1 tablet by mouth daily at 12 noon.     cetirizine (ZYRTEC) 10 MG tablet Take 1 tablet (10 mg total) by mouth daily. 30 tablet 1   Cholecalciferol (D3 PO) Take 1 tablet by mouth daily.     Continuous Blood Gluc Sensor (FREESTYLE LIBRE 14 DAY SENSOR) MISC 1 each by Does not apply route every 14 (fourteen) days. Change every 2 weeks (Patient not taking: Reported on 03/20/2022) 2 each 11   Docusate Sodium (DSS) 100 MG CAPS Take 1 tablet by mouth in the morning and at bedtime.     doxycycline (VIBRA-TABS) 100 MG tablet Take 1 tablet (100 mg total) by mouth 2 (two) times daily. 14 tablet 0   DROPLET PEN NEEDLES  31G X 8 MM MISC USE FOR INJECTING INSULIN 4 TIMES DAILY. 400 each 0   ezetimibe (ZETIA) 10 MG tablet TAKE 1 TABLET EVERY DAY 90 tablet 10   insulin aspart (NOVOLOG FLEXPEN) 100 UNIT/ML FlexPen Inject 5 Units into the skin 3 (three) times daily with meals. Give if eats 50% or more of meal. (Patient taking differently: Inject 14 Units into the skin 3 (three) times daily with meals. Give if eats 50% or more of meal.) 60 mL 0   insulin glargine, 2 Unit Dial, (TOUJEO MAX SOLOSTAR) 300 UNIT/ML Solostar Pen Inject 20-26 Units into the skin daily. 12 mL 0   ipratropium (ATROVENT) 0.03 % nasal spray Place 2 sprays into both nostrils every 12 (twelve) hours. 30 mL 12   lactobacillus acidophilus (BACID) TABS tablet Take 2 tablets by mouth 3 (three) times daily.     lamoTRIgine (LAMICTAL) 100 MG tablet Take 1 tablet (100 mg total) by mouth 2 (two) times  daily. 180 tablet 2   levothyroxine (SYNTHROID) 50 MCG tablet TAKE 1 TABLET DAILY SIX DAYS A WEEK AND 1/2 TABLET ON ONE DAY A WEEK 85 tablet 2   LORazepam (ATIVAN) 0.5 MG tablet TAKE 1 TABLET TWICE DAILY 60 tablet 2   metoprolol tartrate (LOPRESSOR) 50 MG tablet TAKE 1 TABLET TWICE DAILY (NEED MD APPOINTMENT) (Patient taking differently: Take 50 mg by mouth 2 (two) times daily.) 180 tablet 3   Misc. Devices (MATTRESS PAD) MISC FIRM MATTRESS PAD for hospital bed x 1 1 each 0   montelukast (SINGULAIR) 10 MG tablet TAKE 1 TABLET EVERY DAY (Patient taking differently: Take 10 mg by mouth at bedtime.) 90 tablet 1   Multiple Vitamins-Minerals (MULTIVITAMIN GUMMIES ADULT PO) Take 1 tablet by mouth daily.     MYRBETRIQ 25 MG TB24 tablet TAKE 1 TABLET EVERY DAY 90 tablet 1   Omega-3 Fatty Acids (FISH OIL PO) Take 1 capsule by mouth daily.     oxyCODONE-acetaminophen (PERCOCET) 10-325 MG tablet Take 1 tablet by mouth every 8 (eight) hours as needed for pain.     potassium chloride (KLOR-CON M) 10 MEQ tablet Take 1 tablet (10 mEq total) by mouth 2 (two) times daily. (Patient not taking: Reported on 03/20/2022) 60 tablet 1   potassium chloride (KLOR-CON) 10 MEQ tablet Take 1 tablet (10 mEq total) by mouth 2 (two) times daily. 30 tablet 2   predniSONE (DELTASONE) 20 MG tablet Take 1 tablet (20 mg total) by mouth daily. (Patient not taking: Reported on 03/14/2022) 4 tablet 0   pregabalin (LYRICA) 75 MG capsule Take 1 capsule (75 mg total) by mouth daily.     RABEprazole (ACIPHEX) 20 MG tablet TAKE 1 TABLET TWICE DAILY 180 tablet 2   RESTASIS 0.05 % ophthalmic emulsion Place 2 drops into both eyes daily.     risperiDONE (RISPERDAL) 0.5 MG tablet Take 1 tablet (0.5 mg total) by mouth at bedtime. 90 tablet 2   rosuvastatin (CRESTOR) 5 MG tablet TAKE 1 TABLET AT BEDTIME. 90 tablet 1   sertraline (ZOLOFT) 100 MG tablet Take 1 tablet (100 mg total) by mouth every morning. 90 tablet 2   SPIKEVAX injection       spironolactone (ALDACTONE) 50 MG tablet TAKE 1 TABLET (50 MG TOTAL) BY MOUTH DAILY. DISCONTINUE SPIRONOLACTONE '25MG'$  90 tablet 3   STIOLTO RESPIMAT 2.5-2.5 MCG/ACT AERS INHALE 2 PUFFS INTO THE LUNGS DAILY. 12 g 1   telmisartan (MICARDIS) 20 MG tablet Take 10 mg by mouth daily.  tizanidine (ZANAFLEX) 2 MG capsule Take 1 capsule (2 mg total) by mouth 3 (three) times daily. Do not drink alcohol or drive while taking this medication.  May cause drowsiness. 15 capsule 0   traZODone (DESYREL) 150 MG tablet Take 1 tablet (150 mg total) by mouth at bedtime. 90 tablet 2   vitamin E 180 MG (400 UNITS) capsule Take 400 Units by mouth daily.     No current facility-administered medications on file prior to visit.      Allergies  Allergen Reactions   Cephalexin Hives   Iron Nausea And Vomiting   Milk-Related Compounds Other (See Comments)    Doesn't agree with stomach.    Penicillins Hives        Phenazopyridine Hcl Hives         Family History  Problem Relation Age of Onset   Heart attack Mother        HTN   Pneumonia Father    Kidney failure Father    Diabetes Father    Pancreatic cancer Sister    Cancer Sister        breast    Cancer Sister        pancreatic   Diabetes Brother    Hypertension Brother    Diabetes Brother    Alcohol abuse Maternal Uncle    Stroke Maternal Grandmother    Heart attack Maternal Grandfather    Hypertension Son    Sleep apnea Son    Colon cancer Neg Hx    Anesthesia problems Neg Hx    Hypotension Neg Hx    Malignant hyperthermia Neg Hx    Pseudochol deficiency Neg Hx    Breast cancer Neg Hx    Stomach cancer Neg Hx    PE: BP 120/72 (BP Location: Right Wrist, Patient Position: Sitting, Cuff Size: Small)   Pulse 66   Ht '4\' 11"'$  (1.499 m)   Wt 164 lb 6.4 oz (74.6 kg)   SpO2 98%   BMI 33.20 kg/m    Wt Readings from Last 3 Encounters:  04/20/22 164 lb 6.4 oz (74.6 kg)  04/13/22 166 lb 1.3 oz (75.3 kg)  03/14/22 166 lb 6.4 oz (75.5 kg)    Constitutional: overweight, in NAD, walks with a walker.  Difficulty rising from a sitting position without help. Eyes: EOMI, no exophthalmos ENT: no thyromegaly, no cervical lymphadenopathy Cardiovascular: RRR, No MRG Respiratory: CTA B Musculoskeletal: no deformities Skin: moist, warm, no rashes Neurological: no tremor with outstretched hands  ASSESSMENT: 1. DM2, insulin-dependent, uncontrolled, without complications - gastroparesis - cerebrovasc. Ds -  H/o stroke - PN  She was interested in an insulin pump >> discussed about [employer] (given brochure).  2. Hypothyroidism  3. MNG - Thyroid U/S (10/11/2012): Right thyroid lobe:  3.7 x 1.0 x 1.8 cm Left thyroid lobe:   3.5 x 1.5 x 1.6 cm Isthmus:  0.5 cm Focal nodules:  There is a 0.3 mm calcified nodule in the right midzone.  There is a 0.6 x 0.7 x 0.5 cm hypoechoic nodule in the lateral aspect of the right midzone.  There is a 0.7 x 0.6 x 0.5 cm hyperechoic nodule in the lateral aspect of the right lower pole. There is a 2.2 x 1.1 x 1.7 cm complex solid nodule in the inferior aspect of the isthmus extending adjacent to the left lower pole. There is a 1.4 x 2.1 x 1.4 cm complex solid nodule in the left lower pole.  Lymphadenopathy:  None visualized.  IMPRESSION: Multi nodular goiter which has markedly progressed since the prior exam.  The dominant nodule at the inferior aspect of the isthmus fits criteria for fine needle aspiration biopsy if not previously Assessed.  - FNA (11/05/2012) x2: benign  - Thyroid U/S (01/20/2014) - felt one nodule enlarging >> new U/S: stable appearance of the nodules, except a small new nodule in isthmus: 1.2 cm  Right thyroid lobe Measurements: 4.4 cm x 1.2 cm x 1.7 cm. Multiple right-sided nodules identified. Each right-sided nodule demonstrates increased echogenicity, with the superior measuring 7 mm -8 mm, most inferior measuring 9 mm - 10 mm. A small, 3 mm focus of calcium with posterior  shadowing is evident. There is also a mid nodule measuring 7 mm, which is echogenic. Left thyroid lobe Measurements: 5.1 cm x 2.1 cm x 2.3 cm. Dominant lesion at the inferior pole of left thyroid is again evident, which has been previously biopsied (10/29/2012). This nodule measures 1.8 cm x 1.7cm x 2.5 cm. (Previous 1.4 cm x 2.1 cm x 1.4 cm) Isthmus Thickness: 4 mm-5 mm. Isthmic nodule again noted, previously biopsied (10/29/2012). Currently this nodule measures 2.2 cm x 1.4 cm x 1.8 cm. (previous measurement 2.2 cm x 1.1 cm x 1.7 cm). There is a new nodule identified within the isthmus with heterogeneously hyperechoic characteristics. This nodule measures 12 mm x 5.4 mm x 7.2 mm. Lymphadenopathy: None visualized.  - Thyroid U/S (11/09/2015): Parenchymal Echotexture: Moderately heterogenous Estimated total number of nodules > 1 cm: <5 Number of spongiform nodules > 2 cm not described below (TR1): 0 Number of mixed cystic nodules > 1.5 cm not described below ([place]): 0 _____________________________________________________  Isthmus: 0.7 cm  Nodule # 1: Prior biopsy: No Location: Isthmus; left Size: 1.5 x 1.3 x 1.5 cm, previously 2.2 x 1.4 x 1.8 cm Composition: solid/almost completely solid (2) Echogenicity: hyperechoic (1) ACR TI-RADS total points: 3. Change in features: Yes. Increased internal cystic degeneration and smaller in size. Change in ACR TI-RADS risk category: No  *Given size (1.5 - 2.4 cm) and appearance, a follow-up ultrasound in 1 year is recommended based on TI-RADS criteria as clinically indicated.  _________________________________________________________  Right lobe: 4.6 x 1.1 x 1.9 cm, previously 4.4 x 1.2 x 1.7 cm Stable scattered subcentimeter echogenic nodules _________________________________________________________  Left lobe: 4.8 x 2.1 x 1.9 cm, previously 5.1 x 2.1 x 2.3 cm Nodule # 1: Prior biopsy: No Location: Left; Inferior Size: 2.4 x 1.7 x 1.9 cm,  previously 2.5 x 1.7 x 1.7 Composition: solid/almost completely solid (2) Echogenicity: hyperechoic (1) ACR TI-RADS total points: 3. ACR TI-RADS risk category: TR3 (3 points). Change in features: No Change in ACR TI-RADS risk category: No *Given size (1.5 - 2.4 cm) and appearance, a follow-up ultrasound in 1 year is recommended based on TI-RADS criteria as clinically indicated.  IMPRESSION: TR 3 isthmic and left thyroid nodules unchanged. *Given size (1.5 - 2.4 cm) and appearance of both thyroid nodules, a follow-up ultrasound in 1 year is recommended based on TI-RADS criteria as clinically indicated.  10/12/2016: Thyroid U/S: Parenchymal Echotexture: Moderately heterogenous Isthmus: 1.1 cm Right lobe: 3.8 x 1.0 x 1.7 cm Left lobe: 4.5 x 1.7 x 2.1 cm  ______________________________________________________   Estimated total number of nodules >/= 1 cm: 3  _____________________________________________   Diffusely heterogeneous multinodular thyroid gland. As seen previously, there are multiple small echogenic nodules scattered throughout the right gland which do not meet criteria for biopsy or follow-up. Head and lobular pyramidal lobe  extends superiorly from the thyroid isthmus. No interval change.   The previously biopsied nodule in the inferior aspect of the isthmus measures slightly smaller today at 1.5 x 1.0 x 1.4 cm compared to 1.7 x 1.3 x 1.5 cm previously.   The previously biopsied nodule in the left inferior gland is essentially unchanged at 2.6 x 1.3 x 1.5 cm compared to 2.4 x 1.8 x 1.9 cm. No new nodule or abnormality identified.   IMPRESSION: 1. Stable multinodular goiter with a prominent lobular parental lobe extending superiorly from the thyroid isthmus. 2. The previously biopsied nodule in the inferior isthmus measures slightly smaller on today's examination. 3. The previously biopsied nodule in the left inferior gland is stable. 4. Additional scattered  small and benign-appearing nodules do not meet criteria for biopsy or continued follow-up.  Thyroid U/S (02/02/2021): Parenchymal Echotexture: Mildly heterogeneous Isthmus: 0.6 cm Right lobe: 4.4 x 0.8 x 1.7 cm  Left lobe: 4.5 x 2.1 x 3.6 cm  ______________________________________________________   Nodule # 1:  Prior biopsy: Yes-10/29/2012  Location: Isthmus; superior  Maximum size: 2.6 cm; Other 2 dimensions: 1.0 x 2.0 cm, previously, 1.5 x 1.0 x 1.4 cm  Composition: mixed cystic and solid (1)  Echogenicity: isoechoic (1) Significant change in size (>/= 20% in two dimensions and minimal increase of 2 mm): Yes (predominately due to increase in cystic component) Change in features: Yes Change in ACR TI-RADS risk category: Yes This nodule does NOT meet TI-RADS criteria for biopsy or dedicated follow-up.  _________________________________________________________   Nodule 2: 1.0 x 0.8 x 0.8 cm hypoechoic (actually hyperechoic) nodule in the inferior right thyroid lobe does (NOT) meet criteria for FNA or imaging surveillance. _________________________________________________________   There is diffuse heterogeneity of the left thyroid lobe without discrete nodules.   IMPRESSION: Previously biopsied isthmus nodule demonstrates interval development of cystic component. It does not meet criteria for repeat biopsy at this time. No new suspicious thyroid nodules.   This did not show appears to have increased in size, but this is actually due to the increased cystic component.  This nodule has been previously biopsied with benign results. Called and discussed with Dr.Mir with [facility] and he acknowledged that the changes in red above have been dictation errors and he addended the report.  PLAN:  1. Patient with longstanding, uncontrolled, type 2 diabetes, on basal/bolus insulin regimen, with fluctuating CBGs.  Her blood sugars at home does not usually correlate with the HbA1c, which  appears to be lower. -Since last visit, she was not able to start on the CGM despite seeing the diabetes educator and seeing how this is attached.  She would prefer to not use it. -With her history of dehydration and hyperkalemia in the setting of AKI, she is not a good candidate for SGLT2 inhibitor.  This was tried by her nephrologist but further testing prompting discontinuation, reportedly.  At today's visit, she inquires about a GLP-1 receptor agonist.  She has a history of pancreatitis, so this would not be ideal for her. -At today's visit, reviewing her blood sugars at home, they are approximately at goal, with occasional hyperglycemic spikes.  She does have a stepwise regimen with higher doses of insulin for when she gets steroids.  For now, I would not suggest any change in regimen. - I advised her to:  Patient Instructions  Please continue: - Toujeo 20 units daily (25 units during steroid treatment) - Novolog  14 units for smaller meals (17 units during steroid treatment)  17 units for large meals  (20 units during steroid treatment)  Please continue levothyroxine 50 mcg daily.   Take the thyroid hormone every day, with water, at least 30 minutes before breakfast, separated by at least 4 hours from: - acid reflux medications - calcium - iron - multivitamins   Please return in 3-4 months.  - we checked her HbA1c: 5.5% (slightly lower, but again lower than expected from her blood sugars at home) - advised to check sugars at different times of the day - 4x a day, rotating check times - advised for yearly eye exams >> she is UTD - return to clinic in 3-4 months   2. Hypothyroidism - latest thyroid labs reviewed with pt. >> normal: Lab Results  Component Value Date   TSH 0.494 10/04/2021  - she continues on LT4 50 mcg daily - pt feels good on this dose. - we discussed about taking the thyroid hormone every day, with water, >30 minutes before breakfast, separated by >4 hours from  acid reflux medications, calcium, iron, multivitamins. Pt. is taking it correctly. At last visits I made sure that the home health nurse gives her Aciphex more than 4 hours after levothyroxine - will check thyroid tests at next visit  3. MNG -She has a chest tube benign thyroid nodule biopsied in 2014 -Reviewing the report of her thyroid ultrasound from 2014-2022, these nodules have remained stable -She still has chronic mild dysphagia, with no esophageal compression from the thyroid (per swallow study from 08/2014). She has a history of multiple esophageal dilations. -She had another thyroid ultrasound in 01/2021 and one of the nodule were larger, but this was due to fluid accumulation.   I did not suggest further intervention.  Philemon Kingdom, MD PhD [facility] Endocrinology

## 2022-04-20 NOTE — Patient Instructions (Addendum)
Please continue: - Toujeo 20 units daily (25 units during steroid treatment) - Novolog  14 units for smaller meals (17 units during steroid treatment) 17 units for large meals  (20 units during steroid treatment)   Please continue levothyroxine 50 mcg daily.   Take the thyroid hormone every day, with water, at least 30 minutes before breakfast, separated by at least 4 hours from: - acid reflux medications - calcium - iron - multivitamins   Please return in 4 months.

## 2022-04-20 NOTE — Telephone Encounter (Signed)
Pt called stating she is wanting to know if she can get a new order for Home Health. All of the ones from the one where she is are leaving.

## 2022-04-21 ENCOUNTER — Inpatient Hospital Stay: Payer: Medicare HMO

## 2022-04-21 ENCOUNTER — Inpatient Hospital Stay: Payer: Medicare HMO | Attending: Hematology

## 2022-04-21 VITALS — BP 144/58 | HR 68 | Temp 97.3°F | Resp 20

## 2022-04-21 DIAGNOSIS — N1832 Chronic kidney disease, stage 3b: Secondary | ICD-10-CM | POA: Diagnosis not present

## 2022-04-21 DIAGNOSIS — I129 Hypertensive chronic kidney disease with stage 1 through stage 4 chronic kidney disease, or unspecified chronic kidney disease: Secondary | ICD-10-CM | POA: Insufficient documentation

## 2022-04-21 DIAGNOSIS — D631 Anemia in chronic kidney disease: Secondary | ICD-10-CM | POA: Diagnosis not present

## 2022-04-21 DIAGNOSIS — D638 Anemia in other chronic diseases classified elsewhere: Secondary | ICD-10-CM

## 2022-04-21 DIAGNOSIS — Z79899 Other long term (current) drug therapy: Secondary | ICD-10-CM | POA: Diagnosis not present

## 2022-04-21 LAB — CBC
HCT: 34.7 % — ABNORMAL LOW (ref 36.0–46.0)
Hemoglobin: 9.9 g/dL — ABNORMAL LOW (ref 12.0–15.0)
MCH: 22.6 pg — ABNORMAL LOW (ref 26.0–34.0)
MCHC: 28.5 g/dL — ABNORMAL LOW (ref 30.0–36.0)
MCV: 79.2 fL — ABNORMAL LOW (ref 80.0–100.0)
Platelets: 247 10*3/uL (ref 150–400)
RBC: 4.38 MIL/uL (ref 3.87–5.11)
RDW: 14.9 % (ref 11.5–15.5)
WBC: 7.4 10*3/uL (ref 4.0–10.5)
nRBC: 0 % (ref 0.0–0.2)

## 2022-04-21 MED ORDER — EPOETIN ALFA 10000 UNIT/ML IJ SOLN
10000.0000 [IU] | Freq: Once | INTRAMUSCULAR | Status: AC
  Administered 2022-04-21: 10000 [IU] via SUBCUTANEOUS
  Filled 2022-04-21: qty 1

## 2022-04-21 NOTE — Patient Instructions (Signed)
[facility]  Discharge Instructions: Thank you for choosing [facility] to provide your oncology and hematology care.  If you have a lab appointment with the [facility], please come in thru the Main Entrance and check in at the main information desk.  Wear comfortable clothing and clothing appropriate for easy access to any Portacath or PICC line.   We strive to give you quality time with your provider. You may need to reschedule your appointment if you arrive late (15 or more minutes).  Arriving late affects you and other patients whose appointments are after yours.  Also, if you miss three or more appointments without notifying the office, you may be dismissed from the clinic at the provider's discretion.      For prescription refill requests, have your pharmacy contact our office and allow 72 hours for refills to be completed.    Today you received the following , retacrit injection   To help prevent nausea and vomiting after your treatment, we encourage you to take your nausea medication as directed.  BELOW ARE SYMPTOMS THAT SHOULD BE REPORTED IMMEDIATELY: *FEVER GREATER THAN 100.4 F (38 C) OR HIGHER *CHILLS OR SWEATING *NAUSEA AND VOMITING THAT IS NOT CONTROLLED WITH YOUR NAUSEA MEDICATION *UNUSUAL SHORTNESS OF BREATH *UNUSUAL BRUISING OR BLEEDING *URINARY PROBLEMS (pain or burning when urinating, or frequent urination) *BOWEL PROBLEMS (unusual diarrhea, constipation, pain near the anus) TENDERNESS IN MOUTH AND THROAT WITH OR WITHOUT PRESENCE OF ULCERS (sore throat, sores in mouth, or a toothache) UNUSUAL RASH, SWELLING OR PAIN  UNUSUAL VAGINAL DISCHARGE OR ITCHING   Items with * indicate a potential emergency and should be followed up as soon as possible or go to the Emergency Department if any problems should occur.  Please show the CHEMOTHERAPY ALERT CARD or IMMUNOTHERAPY ALERT CARD at check-in to the Emergency Department and triage  nurse.  Should you have questions after your visit or need to cancel or reschedule your appointment, please contact [facility] [phone]  and follow the prompts.  Office hours are 8:00 a.m. to 4:30 p.m. Monday - Friday. Please note that voicemails left after 4:00 p.m. may not be returned until the following business day.  We are closed weekends and major holidays. You have access to a nurse at all times for urgent questions. Please call the main number to the clinic [phone] and follow the prompts.  For any non-urgent questions, you may also contact your provider using MyChart. We now offer e-Visits for anyone 2 and older to request care online for non-urgent symptoms. For details visit [email or web address].   Also download the MyChart app! Go to the app store, search "MyChart", open the app, select [facility], and log in with your MyChart username and password.

## 2022-04-21 NOTE — Progress Notes (Signed)
Retacrit injection given per orders. Patient tolerated it well without problems. Vitals stable and discharged home from clinic ambulatory. Follow up as scheduled.  

## 2022-04-21 NOTE — Telephone Encounter (Signed)
[id number]

## 2022-04-22 ENCOUNTER — Encounter: Payer: Self-pay | Admitting: Hematology

## 2022-04-23 ENCOUNTER — Other Ambulatory Visit: Payer: Self-pay | Admitting: Internal Medicine

## 2022-04-23 DIAGNOSIS — E1165 Type 2 diabetes mellitus with hyperglycemia: Secondary | ICD-10-CM

## 2022-04-24 ENCOUNTER — Telehealth: Payer: Self-pay | Admitting: Family Medicine

## 2022-04-24 NOTE — Telephone Encounter (Signed)
LMTCB

## 2022-04-24 NOTE — Telephone Encounter (Signed)
Patient called to let Dr Moshe Cipro know she fell out of the bed yesterday, left side of breast under her breast hurts to left side down under her hip she is having pain. Patient not able to come in she has other appointment today what should patient do?

## 2022-04-25 NOTE — Telephone Encounter (Signed)
scheduled

## 2022-04-26 ENCOUNTER — Ambulatory Visit (INDEPENDENT_AMBULATORY_CARE_PROVIDER_SITE_OTHER): Payer: Medicare HMO | Admitting: Psychiatry

## 2022-04-26 ENCOUNTER — Encounter: Payer: Self-pay | Admitting: Hematology

## 2022-04-26 ENCOUNTER — Ambulatory Visit: Payer: Medicare HMO | Admitting: Family Medicine

## 2022-04-26 DIAGNOSIS — F331 Major depressive disorder, recurrent, moderate: Secondary | ICD-10-CM

## 2022-04-26 NOTE — Progress Notes (Unsigned)
Virtual Visit via Telephone Note  I connected with [name] on 04/26/22 at 2:15 PM EST by telephone and verified that I am speaking with the correct person using two identifiers.  Location: Patient: Home Provider: [place] office    I discussed the limitations, risks, security and privacy concerns of performing an evaluation and management service by telephone and the availability of in person appointments. I also discussed with the patient that there may be a patient responsible charge related to this service. The patient expressed understanding and agreed to proceed.   I provided 45 minutes of non-face-to-face time during this encounter.   [name] Smoker, LCSW             THERAPIST PROGRESS NOTE    Session Time: Wednesday 04/26/2022 2:15 PM -  3:00 PM   Participation Level: Active  Behavioral Response: less depressed, talkative,   Type of Therapy: Individual Therapy  Treatment Goals addressed: Patient will score less than 10 on the patient health questionnaire  Progress on goals: Progressing  Interventions: CBT and Supportive             Summary: [name] is a 72 y.o. female who presents with  long standing history of recurrent periods  of depression beginning when she was 89 and her favorite uncle died. Patient reports multiple psychiatric hospitlaizations due to depression and suicidal ideations with the last one occuring in 1997. Patient has participated in outpatient psychotherapy and medication management intermittently since age 73.  She currently is seeing psychiatrist Dr. Harrington Challenger . Prior to this, she was seen at [facility]. Patient also has had ECT at [facility]. Symptoms have worsened in recent months due to family stress and have  included depressed mood, anxiety, excessive worry, and tearfulness.            Patient's last contact was by virtual visit via telephone about 2 month sago.  Patient reports continued symptoms of depression but  decreased intensity and frequency.  However she reports increased stress and anxiety due to health issues.  Patient reports increased pain today as she fell out of her bed this past Sunday night.  She continues to worry about sharing information with her medical providers as she fears she will be sent to a nursing home.  She reports still trying to force self to remain involved in activities especially attending church.  She continues to express desire to change her interaction with people especially at church.  She reports sometimes getting upset and having difficulty managing her reactions.     Suicidal/Homicidal: Nowithout intent/plan      Therapist Response: Reviewed symptoms, discussed stressors, facilitated expression of thoughts and feelings, validated feelings, encouraged patient to follow through with appointment to see PCP [name] tomorrow and share information regarding her fall, discussed possible benefits of doing this, began to discuss next steps for treatment, began to assist patient explore how she would like to change her interaction with others   Diagnosis: Axis I: MDD, Recurrent, moderate    Axis II: Deferred Collaboration of Care: Psychiatrist AEB patient working with psychiatrist Dr. Harrington Challenger  Patient/Guardian was advised Release of Information must be obtained prior to any record release in order to collaborate their care with an outside provider. Patient/Guardian was advised if they have not already done so to contact the registration department to sign all necessary forms in order for [name] to release information regarding their care.   Consent: Patient/Guardian gives verbal consent for treatment  and assignment of benefits for services provided during this visit. Patient/Guardian expressed understanding and agreed to proceed.    [name] Peifer E Keven Osborn, LCSW

## 2022-04-27 ENCOUNTER — Ambulatory Visit (INDEPENDENT_AMBULATORY_CARE_PROVIDER_SITE_OTHER): Payer: Medicare HMO | Admitting: Family Medicine

## 2022-04-27 ENCOUNTER — Encounter: Payer: Self-pay | Admitting: Family Medicine

## 2022-04-27 ENCOUNTER — Ambulatory Visit (HOSPITAL_COMMUNITY)
Admission: RE | Admit: 2022-04-27 | Discharge: 2022-04-27 | Disposition: A | Discharge status: Home / Self Care | Payer: Medicare HMO | Source: Ambulatory Visit | Attending: Family Medicine | Admitting: Family Medicine

## 2022-04-27 ENCOUNTER — Telehealth: Payer: Self-pay | Admitting: *Deleted

## 2022-04-27 ENCOUNTER — Ambulatory Visit (HOSPITAL_COMMUNITY): Payer: Medicare HMO | Admitting: Psychiatry

## 2022-04-27 VITALS — BP 117/71 | HR 66 | Ht 59.0 in | Wt 167.0 lb

## 2022-04-27 DIAGNOSIS — R079 Chest pain, unspecified: Secondary | ICD-10-CM

## 2022-04-27 DIAGNOSIS — M25512 Pain in left shoulder: Secondary | ICD-10-CM

## 2022-04-27 DIAGNOSIS — G8911 Acute pain due to trauma: Secondary | ICD-10-CM

## 2022-04-27 DIAGNOSIS — I10 Essential (primary) hypertension: Secondary | ICD-10-CM | POA: Diagnosis not present

## 2022-04-27 MED ORDER — KETOROLAC TROMETHAMINE 60 MG/2ML IM SOLN
30.0000 mg | Freq: Once | INTRAMUSCULAR | Status: AC
  Administered 2022-04-27: 30 mg via INTRAMUSCULAR

## 2022-04-27 MED ORDER — DIAZEPAM 5 MG PO TABS: 5.0000 mg | ORAL_TABLET | Freq: Once | ORAL | 0 refills | Status: DC | PRN

## 2022-04-27 NOTE — Telephone Encounter (Signed)
Communicated response to the patient.  She states understanding.

## 2022-04-27 NOTE — Assessment & Plan Note (Addendum)
DG CXR increased pain s/p fall Toradol 30 mg IM in office

## 2022-04-27 NOTE — Addendum Note (Signed)
Addended by: Ventura Sellers on: 04/27/2022 01:55 PM   Modules accepted: Orders

## 2022-04-27 NOTE — Patient Instructions (Signed)
F/u in  May as before, call if you need me sooner  X ray of left shoulder and chest today  Toradol 30 mg IM in office today for pain  Nurse please follow up with her, states she needs new agency to fix meds etc as all staff is quitting  Careful ,no more falls, pad bed and get onwe side pushed to the wall please  Thanks for choosing [facility], we consider it a privelige to serve you.

## 2022-04-27 NOTE — Telephone Encounter (Signed)
Patient called to advise that she is scheduled for MRI on 05/19/2022.  She states she has claustrophobia related to closed spaces.  She reports she already takes Lorazepam 0.5 mg twice daily and needs something to take on top of that for the claustrophobia.  She doesn't want to use extra of her current dose as it is controlled.    Routing to Dr Mickeal Skinner to please advise.

## 2022-04-28 ENCOUNTER — Telehealth: Payer: Self-pay | Admitting: Family Medicine

## 2022-04-28 NOTE — Telephone Encounter (Signed)
Patient called in for xray results

## 2022-04-28 NOTE — Telephone Encounter (Signed)
Patient aware.

## 2022-04-30 DIAGNOSIS — G8911 Acute pain due to trauma: Secondary | ICD-10-CM | POA: Insufficient documentation

## 2022-04-30 DIAGNOSIS — M25512 Pain in left shoulder: Secondary | ICD-10-CM | POA: Insufficient documentation

## 2022-04-30 NOTE — Assessment & Plan Note (Signed)
Limited ROM, tender, x ray to r/o fracture , has chronic left shoulder pathology Toradol 30 mg iM in office

## 2022-04-30 NOTE — Progress Notes (Signed)
   [name]     MRN: [id number]      DOB: [birth year]   HPI [name] is here c/o left chest and shoulder pain after falling out of bed on 04/23/2022. No Loc , no bleeding or bruising She has chronic left shoulder pain with reduced mobility, which has worsened. No change in breathing reported, but painful at times when she coughs or takes a deep breath  ROS Denies recent fever or chills. Denies sinus pressure, nasal congestion, ear pain or sore throat. Denies chest congestion, productive cough or wheezing. Denies abdominal pain, nausea, vomiting.   Denies dysuria, frequency, hesitancy chronic  incontinence. Increased  joint pain, swelling and limitation in mobility. Denies headaches, seizures, numbness, or tingling. Denies uncontrolled  depression, anxiety or insomnia. Denies skin break down or rash.   PE  BP 117/71 (BP Location: Right Arm, Patient Position: Sitting, Cuff Size: Normal)   Pulse 66   Ht 4' 11"$  (1.499 m)   Wt 167 lb 0.6 oz (75.8 kg)   SpO2 95%   BMI 33.74 kg/m   Patient alert and oriented and in no cardiopulmonary distress.  HEENT: No facial asymmetry, EOMI,     Neck decreased ROM  Chest: Clear to auscultation bilaterally.  CVS: S1, S2 no murmurs, no S3.Regular rate.  ABD: Soft non tender.   Ext: No edema  MS: decreased ROM spine,left  shoulder, adequate in hips and knees.  Skin: Intact, no ulcerations or rash noted.  Psych: Good eye contact, normal affect. Memory intact not anxious or depressed appearing.  CNS: CN 2-12 intact, power,  normal throughout.no focal deficits noted.   Assessment & Plan  Left-sided chest pain DG CXR increased pain s/p fall Toradol 30 mg IM in office  Acute pain of left shoulder due to trauma Limited ROM, tender, x ray to r/o fracture , has chronic left shoulder pathology Toradol 30 mg iM in office  Essential hypertension Controlled, no change in medication

## 2022-04-30 NOTE — Assessment & Plan Note (Signed)
Controlled, no change in medication  

## 2022-05-01 ENCOUNTER — Telehealth: Payer: Self-pay | Admitting: Family Medicine

## 2022-05-01 NOTE — Plan of Care (Signed)
Chronic Care Management Provider Comprehensive Care Plan    05/01/2022 Name: VIVEKA PURNELL MRN: [id number] DOB: 1951/03/04  Referral to Chronic Care Management (CCM) services was placed by Provider:  Tula Nakayama MD on Date: 12/29/21.  Chronic Condition 1: HYPERTENSION Provider Assessment and Plan Essential hypertension Adequate though sub optimal control, no med change Needs to reduce sodium intake   Expected Outcome/Goals Addressed This Visit (Provider CCM goals/Provider Assessment and plan  CCM (HYPERTENSION) EXPECTED OUTCOME: MONITOR, SELF-MANAGE AND REDUCE SYMPTOMS OF HYPERTENSION  Symptom Management Condition 1: Take medications as prescribed   Attend all scheduled provider appointments Call pharmacy for medication refills 3-7 days in advance of running out of medications Attend church or other social activities Call provider office for new concerns or questions  check blood pressure 3 times per week choose a place to take my blood pressure (home, clinic or office, retail store) write blood pressure results in a log or diary take blood pressure log to all doctor appointments take medications for blood pressure exactly as prescribed eat more whole grains, fruits and vegetables, lean meats and healthy fats  Chronic Condition 2: DIABETES Provider Assessment and Plan DMII (diabetes mellitus, type 2) ([place]) Ms. Ollar is reminded of the importance of commitment to daily physical activity for 30 minutes or more, as able and the need to limit carbohydrate intake to 30 to 60 grams per meal to help with blood sugar control.    The need to take medication as prescribed, test blood sugar as directed, and to call between visits if there is a concern that blood sugar is uncontrolled is also discussed.    Ms. Muhl is reminded of the importance of daily foot exam, annual eye examination, and good blood sugar, blood pressure and cholesterol control. Managed by Endo,overcorrected, has had  several episodes of    Expected Outcome/Goals Addressed This Visit (Provider CCM goals/Provider Assessment and plan  CCM (DIABETES) EXPECTED OUTCOME: MONITOR, SELF- MANAGE AND REDUCE SYMPTOMS OF DIABETES  Symptom Management Condition 2: Take medications as prescribed   Attend all scheduled provider appointments Call pharmacy for medication refills 3-7 days in advance of running out of medications Attend church or other social activities Call provider office for new concerns or questions  check blood sugar at prescribed times: 4 times daily check feet daily for cuts, sores or redness enter blood sugar readings and medication or insulin into daily log take the blood sugar log to all doctor visits take the blood sugar meter to all doctor visits trim toenails straight across read food labels for fat, fiber, carbohydrates and portion size wash and dry feet carefully every day wear comfortable, cotton socks Take Freestyle Libre with you when you see [name] on 01/23/22 Keep follow up appointment 01/16/22 with pulmonologist so you can get CPAP supplies, etc  Problem List Patient Active Problem List   Diagnosis Date Noted   Acute pain of left shoulder due to trauma 04/30/2022   Left-sided chest pain 04/27/2022   Sore throat 03/12/2022   Maxillary sinusitis 03/12/2022   Encounter for Medicare annual examination with abnormal findings 03/09/2022   Acute bronchitis 03/09/2022   Encounter for medication review and counseling 01/31/2022   Rhinitis 01/16/2022   Bilateral leg edema 12/21/2021   Hypoglycemia 12/20/2021   Skin lesions, generalized 12/19/2021   Insomnia due to stress 12/19/2021   Major depressive disorder 12/11/2021   Anemia in chronic kidney disease (CKD) 10/12/2021   Subconjunctival hemorrhage of left eye 10/02/2021   Dehydration  09/25/2021   Encounter for support and coordination of transition of care 09/25/2021   Acute renal failure superimposed on stage 3a chronic  kidney disease ([place]) 09/23/2021   AKI (acute kidney injury) ([place]) 09/16/2021   CHF (congestive heart failure) ([place]) 09/16/2021   Abnormal EKG 09/16/2021   Urinary frequency 09/01/2021   Anemia of chronic disease 08/10/2021   Generalized pain 08/04/2021   Sciatica, right side 07/19/2021   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[place]) 07/10/2021   Atherosclerosis of aorta (HCC) 07/10/2021   Generalized weakness 05/12/2021   Advanced care planning/counseling discussion 12/23/2020   Head trauma, initial encounter 12/20/2020   Trigger finger, right 12/12/2020   Confusion 11/24/2020   Blurry vision 11/18/2020   Cervical radiculopathy 07/09/2020   Closed fracture of lateral malleolus of right fibula 02/27/2020   Headache 02/19/2020   Tubular adenoma of colon 02/09/2019   Benign neoplasm of cerebral meninges ([place]) 11/12/2018   Benign meningioma of brain ([place]) 10/31/2018   Osteoarthritis of both hips 07/06/2018   Lipoma of extremity 12/31/2017   Impingement syndrome of left shoulder region 12/27/2017   Weakness generalized    Leg weakness, bilateral 10/28/2017   Lumbar spondylosis with myelopathy 10/12/2017   Numbness of hand 10/10/2017   Chronic neck pain 07/25/2017   Chronic pain syndrome 07/25/2017   At risk for cardiovascular event [id number]   Acute metabolic encephalopathy [id number]   Diabetic polyneuropathy associated with type 2 diabetes mellitus ([place]) 05/19/2016   Tobacco user 04/24/2016   Obesity (BMI 30.0-34.9) 04/24/2016   History of palpitations 08/09/2015   Labile hypertension 08/03/2015   Normal coronary arteries 08/03/2015   Dizziness 07/15/2015   Left-sided low back pain with left-sided sciatica 06/27/2015   Multinodular goiter 05/06/2015   Rectocele, female 04/27/2015   Anal sphincter incontinence 04/27/2015   Pelvic relaxation due to rectocele 03/30/2015   Hyperkalemia 02/22/2015   Pulmonary hypertension  ([place]) 02/22/2015   Posterior chest pain 02/21/2015   Episodic cigarette smoking dependence 01/11/2015   Migraine without aura and without status migrainosus, not intractable 07/02/2014   Flatulence 02/18/2014   Microcytic anemia 02/18/2014   Hip pain, right 12/17/2013   Acquired hypothyroidism 08/16/2013   Gastroparesis 04/28/2013   Nicotine dependence 03/09/2013   Seizure disorder ([place]) 01/19/2013   Displacement of cervical intervertebral disc without myelopathy 12/13/2012   Bursitis of shoulder 12/13/2012   Cervical disc disorder with radiculopathy of cervical region 10/31/2012   Solitary pulmonary nodule 08/19/2012   Anemia 07/05/2012   Hypersomnia disorder related to a known organic factor 06/11/2012   Pruritus 04/18/2012   Meningioma ([place]) 11/19/2011   Mononeuritis leg 10/25/2011   Carpal tunnel syndrome of right wrist 05/23/2011   Chronic pain of right hand 05/04/2011   Polypharmacy 04/28/2011   Mood disorder ([place]) 04/28/2011   Constipation 04/13/2011   Recurrent falls 12/12/2010   Oropharyngeal dysphagia 07/12/2010   Urinary incontinence 12/16/2009   HEARING LOSS 10/26/2009   DMII (diabetes mellitus, type 2) ([place]) 07/07/2009   Mixed hyperlipidemia 12/11/2008   IBS 12/11/2008   GERD 07/29/2008   MILK PRODUCTS ALLERGY 07/29/2008   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/03/2007   Essential hypertension 06/27/2007   Backache 06/19/2007   Osteoporosis 06/19/2007   Obstructive sleep apnea 06/19/2007   TRIGGER FINGER 04/18/2007   DIVERTICULOSIS, COLON 11/13/2006    Medication Management  Current Outpatient Medications:    ACCU-CHEK GUIDE test strip, TEST BLOOD SUGAR FOUR TIMES DAILY  AND AS NEEDED,  Disp: 450 strip, Rfl: 2   Accu-Chek Softclix Lancets lancets, TEST BLOOD SUGAR THREE TIMES DAILY AS DIRECTED, Disp: 300 each, Rfl: 2   acetaminophen (TYLENOL) 325 MG tablet, Take 650 mg by mouth every 6 (six) hours as needed., Disp: , Rfl:     albuterol (PROVENTIL) (2.5 MG/3ML) 0.083% nebulizer solution, Take 3 mLs (2.5 mg total) by nebulization every 6 (six) hours as needed for wheezing or shortness of breath., Disp: 75 mL, Rfl: 12   albuterol (VENTOLIN HFA) 108 (90 Base) MCG/ACT inhaler, INHALE 1 TO 2 PUFFS EVERY 6 HOURS AS NEEDED FOR WHEEZING, SHORTNESS OF BREATH (Patient taking differently: Inhale 1-2 puffs into the lungs every 6 (six) hours as needed for wheezing or shortness of breath.), Disp: 3 each, Rfl: 3   Alcohol Swabs (DROPSAFE ALCOHOL PREP) 70 % PADS, USE TO CLEAN FINGER PRIOR TO TESTING FOR BLOOD SUGAR  AS DIRECTED, Disp: 300 each, Rfl: 1   alendronate (FOSAMAX) 70 MG tablet, TAKE 1 TABLET EVERY 7 DAYS ON AN EMPTY STOMACH WITH A FULL GLASS OF WATER (Patient taking differently: Take 70 mg by mouth once a week.), Disp: 12 tablet, Rfl: 3   ascorbic acid (VITAMIN C) 500 MG tablet, Take 500 mg by mouth daily., Disp: , Rfl:    aspirin EC 81 MG tablet, Take 1 tablet (81 mg total) by mouth daily with breakfast., Disp: 120 tablet, Rfl: 2   Blood Glucose Calibration (ACCU-CHEK GUIDE CONTROL) LIQD, USE AS DIRECTED, Disp: 1 each, Rfl: 0   blood glucose meter kit and supplies, Dispense based on patient and insurance preference. Use up to four times daily as directed. (FOR ICD-10 E10.9, E11.9)., Disp: 1 each, Rfl: 0   Calcium Carb-Cholecalciferol (CALTRATE 600+D3 SOFT) 600-20 MG-MCG CHEW, Chew 1 tablet by mouth daily at 12 noon., Disp: , Rfl:    cetirizine (ZYRTEC) 10 MG tablet, Take 1 tablet (10 mg total) by mouth daily., Disp: 30 tablet, Rfl: 1   Cholecalciferol (D3 PO), Take 1 tablet by mouth daily., Disp: , Rfl:    Docusate Sodium (DSS) 100 MG CAPS, Take 1 tablet by mouth in the morning and at bedtime., Disp: , Rfl:    DROPLET PEN NEEDLES 31G X 8 MM MISC, USE FOR INJECTING INSULIN 4 TIMES DAILY., Disp: 400 each, Rfl: 0   insulin aspart (NOVOLOG FLEXPEN) 100 UNIT/ML FlexPen, Inject 5 Units into the skin 3 (three) times daily with meals.  Give if eats 50% or more of meal. (Patient taking differently: Inject 14 Units into the skin 3 (three) times daily with meals. Give if eats 50% or more of meal.), Disp: 60 mL, Rfl: 0   lactobacillus acidophilus (BACID) TABS tablet, Take 2 tablets by mouth 3 (three) times daily., Disp: , Rfl:    metoprolol tartrate (LOPRESSOR) 50 MG tablet, TAKE 1 TABLET TWICE DAILY (NEED MD APPOINTMENT) (Patient taking differently: Take 50 mg by mouth 2 (two) times daily.), Disp: 180 tablet, Rfl: 3   Misc. Devices (MATTRESS PAD) MISC, FIRM MATTRESS PAD for hospital bed x 1, Disp: 1 each, Rfl: 0   montelukast (SINGULAIR) 10 MG tablet, TAKE 1 TABLET EVERY DAY (Patient taking differently: Take 10 mg by mouth at bedtime.), Disp: 90 tablet, Rfl: 1   Multiple Vitamins-Minerals (MULTIVITAMIN GUMMIES ADULT PO), Take 1 tablet by mouth daily., Disp: , Rfl:    MYRBETRIQ 25 MG TB24 tablet, TAKE 1 TABLET EVERY DAY, Disp: 90 tablet, Rfl: 1   Omega-3 Fatty Acids (FISH OIL PO), Take 1 capsule by mouth  daily., Disp: , Rfl:    oxyCODONE-acetaminophen (PERCOCET) 10-325 MG tablet, Take 1 tablet by mouth every 8 (eight) hours as needed for pain., Disp: , Rfl:    potassium chloride (KLOR-CON M) 10 MEQ tablet, Take 1 tablet (10 mEq total) by mouth 2 (two) times daily., Disp: 60 tablet, Rfl: 1   predniSONE (DELTASONE) 20 MG tablet, Take 1 tablet (20 mg total) by mouth daily., Disp: 4 tablet, Rfl: 0   pregabalin (LYRICA) 75 MG capsule, Take 1 capsule (75 mg total) by mouth daily., Disp: , Rfl:    RABEprazole (ACIPHEX) 20 MG tablet, TAKE 1 TABLET TWICE DAILY, Disp: 180 tablet, Rfl: 2   RESTASIS 0.05 % ophthalmic emulsion, Place 2 drops into both eyes daily., Disp: , Rfl:    rosuvastatin (CRESTOR) 5 MG tablet, TAKE 1 TABLET AT BEDTIME., Disp: 90 tablet, Rfl: 1   STIOLTO RESPIMAT 2.5-2.5 MCG/ACT AERS, INHALE 2 PUFFS INTO THE LUNGS DAILY., Disp: 12 g, Rfl: 1   telmisartan (MICARDIS) 20 MG tablet, Take 10 mg by mouth daily., Disp: , Rfl:     tizanidine (ZANAFLEX) 2 MG capsule, Take 1 capsule (2 mg total) by mouth 3 (three) times daily. Do not drink alcohol or drive while taking this medication.  May cause drowsiness., Disp: 15 capsule, Rfl: 0   vitamin E 180 MG (400 UNITS) capsule, Take 400 Units by mouth daily., Disp: , Rfl:    amLODipine (NORVASC) 10 MG tablet, TAKE 1 TABLET EVERY DAY, Disp: 60 tablet, Rfl: 0   benzonatate (TESSALON) 200 MG capsule, Take 1 capsule (200 mg total) by mouth 3 (three) times daily as needed for cough., Disp: 40 capsule, Rfl: 0   buPROPion (WELLBUTRIN XL) 150 MG 24 hr tablet, Take 1 tablet (150 mg total) by mouth every morning., Disp: 90 tablet, Rfl: 2   Continuous Blood Gluc Sensor (FREESTYLE LIBRE 14 DAY SENSOR) MISC, 1 each by Does not apply route every 14 (fourteen) days. Change every 2 weeks, Disp: 2 each, Rfl: 11   diazepam (VALIUM) 5 MG tablet, Take 1 tablet (5 mg total) by mouth once as needed (MRI claustrophobia)., Disp: 6 tablet, Rfl: 0   doxycycline (VIBRA-TABS) 100 MG tablet, Take 1 tablet (100 mg total) by mouth 2 (two) times daily., Disp: 14 tablet, Rfl: 0   ezetimibe (ZETIA) 10 MG tablet, TAKE 1 TABLET EVERY DAY, Disp: 90 tablet, Rfl: 10   ipratropium (ATROVENT) 0.03 % nasal spray, Place 2 sprays into both nostrils every 12 (twelve) hours., Disp: 30 mL, Rfl: 12   lamoTRIgine (LAMICTAL) 100 MG tablet, Take 1 tablet (100 mg total) by mouth 2 (two) times daily., Disp: 180 tablet, Rfl: 2   levothyroxine (SYNTHROID) 50 MCG tablet, TAKE 1 TABLET DAILY SIX DAYS A WEEK AND 1/2 TABLET ON ONE DAY A WEEK, Disp: 85 tablet, Rfl: 2   LORazepam (ATIVAN) 0.5 MG tablet, TAKE 1 TABLET TWICE DAILY, Disp: 60 tablet, Rfl: 2   potassium chloride (KLOR-CON) 10 MEQ tablet, Take 1 tablet (10 mEq total) by mouth 2 (two) times daily., Disp: 30 tablet, Rfl: 2   risperiDONE (RISPERDAL) 0.5 MG tablet, Take 1 tablet (0.5 mg total) by mouth at bedtime., Disp: 90 tablet, Rfl: 2   sertraline (ZOLOFT) 100 MG tablet, Take 1  tablet (100 mg total) by mouth every morning., Disp: 90 tablet, Rfl: 2   SPIKEVAX injection, , Disp: , Rfl:    spironolactone (ALDACTONE) 50 MG tablet, TAKE 1 TABLET (50 MG TOTAL) BY MOUTH DAILY. DISCONTINUE SPIRONOLACTONE 25MG,  Disp: 90 tablet, Rfl: 3   TOUJEO MAX SOLOSTAR 300 UNIT/ML Solostar Pen, INJECT 20-26 UNITS INTO THE SKIN DAILY., Disp: 12 mL, Rfl: 3   traZODone (DESYREL) 150 MG tablet, Take 1 tablet (150 mg total) by mouth at bedtime., Disp: 90 tablet, Rfl: 2  Cognitive Assessment Identity Confirmed: : Name; DOB Cognitive Status: Normal   Functional Assessment Hearing Difficulty or Deaf: no Wear Glasses or Blind: yes Vision Management: can see well w/ glasses Concentrating, Remembering or Making Decisions Difficulty (CP): no Difficulty Communicating: no (does talk slow and takes extra time) Difficulty Eating/Swallowing: no Walking or Climbing Stairs Difficulty: yes Walking or Climbing Stairs: stair climbing difficulty, requires equipment Mobility Management: walker, WC , cane Dressing/Bathing Difficulty: yes Dressing/Bathing: bathing difficulty, assistance 1 person Dressing/Bathing Management: aide 5 days per week Doing Errands Independently Difficulty (such as shopping) (CP): yes Errands Management: aide assists   Caregiver Assessment  Primary Source of Support/Comfort: extended family; nonrelative caregiver Name of Support/Comfort Primary Source: CAP aide   church members People in Home: alone   Planned Interventions  Provided education to patient about basic DM disease process; Reviewed medications with patient and discussed importance of medication adherence;        Reviewed prescribed diet with patient carbohydrate modified; Discussed plans with patient for ongoing care management follow up and provided patient with direct contact information for care management team;      Review of patient status, including review of consultants reports, relevant laboratory and  other test results, and medications completed;       Screening for signs and symptoms of depression related to chronic disease state;        Assessed social determinant of health barriers;        Ask patient to take Freestyle Libre to her endocrinologist appointment and pt reports they helped her before with education Telephone call to Dr. Renne Crigler reporting pt I having difficulty with using the [facility] and feels this may not work for her Evaluation of current treatment plan related to hypertension self management and patient's adherence to plan as established by provider;   Reviewed medications with patient and discussed importance of compliance;  Discussed plans with patient for ongoing care management follow up and provided patient with direct contact information for care management team; Discussed complications of poorly controlled blood pressure such as heart disease, stroke, circulatory complications, vision complications, kidney impairment, sexual dysfunction;  Screening for signs and symptoms of depression related to chronic disease state;  Assessed social determinant of health barriers;   Interaction and coordination with outside resources, practitioners, and providers See CCM Referral  Care Plan: Available in MyChart

## 2022-05-01 NOTE — Telephone Encounter (Signed)
Pt called asking to speak to [facility]

## 2022-05-02 ENCOUNTER — Telehealth: Payer: Self-pay | Admitting: Family Medicine

## 2022-05-02 DIAGNOSIS — I130 Hypertensive heart and chronic kidney disease with heart failure and stage 1 through stage 4 chronic kidney disease, or unspecified chronic kidney disease: Secondary | ICD-10-CM | POA: Diagnosis not present

## 2022-05-02 DIAGNOSIS — D631 Anemia in chronic kidney disease: Secondary | ICD-10-CM | POA: Diagnosis not present

## 2022-05-02 DIAGNOSIS — J449 Chronic obstructive pulmonary disease, unspecified: Secondary | ICD-10-CM | POA: Diagnosis not present

## 2022-05-02 DIAGNOSIS — Z794 Long term (current) use of insulin: Secondary | ICD-10-CM | POA: Diagnosis not present

## 2022-05-02 DIAGNOSIS — E1142 Type 2 diabetes mellitus with diabetic polyneuropathy: Secondary | ICD-10-CM | POA: Diagnosis not present

## 2022-05-02 DIAGNOSIS — E1122 Type 2 diabetes mellitus with diabetic chronic kidney disease: Secondary | ICD-10-CM | POA: Diagnosis not present

## 2022-05-02 DIAGNOSIS — F250 Schizoaffective disorder, bipolar type: Secondary | ICD-10-CM | POA: Diagnosis not present

## 2022-05-02 DIAGNOSIS — I509 Heart failure, unspecified: Secondary | ICD-10-CM | POA: Diagnosis not present

## 2022-05-02 DIAGNOSIS — N1832 Chronic kidney disease, stage 3b: Secondary | ICD-10-CM | POA: Diagnosis not present

## 2022-05-02 NOTE — Telephone Encounter (Signed)
Pt informed of results.

## 2022-05-02 NOTE — Telephone Encounter (Signed)
[name], [phone]   Needs to report that pt had a fall. Since the staff has left they are not going to have nurses to take care of her. Wants to know if she has ben referred to another [facility] agency? States pt wants [employer].

## 2022-05-03 ENCOUNTER — Telehealth: Payer: Self-pay | Admitting: Family Medicine

## 2022-05-03 NOTE — Telephone Encounter (Signed)
Patient called said she was returning a nurse call from yesterday. Also saying her left arm is swollen from left shoulder down into her fingers, left side is no better. Please return patient call at [phone].

## 2022-05-04 ENCOUNTER — Other Ambulatory Visit: Payer: Self-pay

## 2022-05-04 ENCOUNTER — Telehealth: Payer: Self-pay | Admitting: Family Medicine

## 2022-05-04 DIAGNOSIS — R531 Weakness: Secondary | ICD-10-CM

## 2022-05-04 MED ORDER — ADJUSTABLE ARM SLING MISC: 0 refills | Status: DC

## 2022-05-04 NOTE — Telephone Encounter (Signed)
Referral placed requested [facility] per patient request

## 2022-05-04 NOTE — Telephone Encounter (Signed)
Patient called in requesting prescription for sling for arm.  Hand is swollen and patient is having a hard time lifting it. Painful to let arm hang.  Wants a call back.

## 2022-05-04 NOTE — Telephone Encounter (Signed)
See other tele msg

## 2022-05-04 NOTE — Telephone Encounter (Signed)
Arm sling order sent to [facility]

## 2022-05-09 DIAGNOSIS — M25522 Pain in left elbow: Secondary | ICD-10-CM | POA: Diagnosis not present

## 2022-05-09 DIAGNOSIS — M25512 Pain in left shoulder: Secondary | ICD-10-CM | POA: Diagnosis not present

## 2022-05-10 ENCOUNTER — Other Ambulatory Visit: Payer: Self-pay | Admitting: Family Medicine

## 2022-05-10 ENCOUNTER — Other Ambulatory Visit: Payer: Self-pay | Admitting: Internal Medicine

## 2022-05-11 ENCOUNTER — Encounter: Payer: Self-pay | Admitting: Radiology

## 2022-05-11 ENCOUNTER — Inpatient Hospital Stay: Payer: Medicare HMO

## 2022-05-11 ENCOUNTER — Ambulatory Visit (INDEPENDENT_AMBULATORY_CARE_PROVIDER_SITE_OTHER): Payer: Medicare HMO | Admitting: Psychiatry

## 2022-05-11 ENCOUNTER — Ambulatory Visit (INDEPENDENT_AMBULATORY_CARE_PROVIDER_SITE_OTHER): Payer: Medicare HMO | Admitting: *Deleted

## 2022-05-11 ENCOUNTER — Inpatient Hospital Stay: Payer: Medicare HMO | Admitting: Physician Assistant

## 2022-05-11 ENCOUNTER — Ambulatory Visit (HOSPITAL_COMMUNITY): Payer: Medicare HMO | Admitting: Psychiatry

## 2022-05-11 DIAGNOSIS — F331 Major depressive disorder, recurrent, moderate: Secondary | ICD-10-CM | POA: Diagnosis not present

## 2022-05-11 DIAGNOSIS — E1143 Type 2 diabetes mellitus with diabetic autonomic (poly)neuropathy: Secondary | ICD-10-CM

## 2022-05-11 DIAGNOSIS — I10 Essential (primary) hypertension: Secondary | ICD-10-CM

## 2022-05-11 DIAGNOSIS — E1159 Type 2 diabetes mellitus with other circulatory complications: Secondary | ICD-10-CM
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
## 2022-05-11 NOTE — Patient Instructions (Signed)
Please call the care guide team at [phone] if you need to cancel or reschedule your appointment.   If you are experiencing a Mental Health or [place] or need someone to talk to, please call the Suicide and Crisis Lifeline: 988 call the [place] National Suicide Prevention Lifeline: 414 110 8492 or TTY: 952-526-8778 TTY 3022529667) to talk to a trained counselor call 1-800-273-TALK (toll free, 24 hour hotline) go to [facility] [address] [place] [id number]) call the [facility]: [phone] call 911   Following is a copy of the CCM Program Consent:  CCM service includes personalized support from designated clinical staff supervised by the physician, including individualized plan of care and coordination with other care providers 24/7 contact phone numbers for assistance for urgent and routine care needs. Service will only be billed when office clinical staff spend 20 minutes or more in a month to coordinate care. Only one practitioner may furnish and bill the service in a calendar month. The patient may stop CCM services at amy time (effective at the end of the month) by phone call to the office staff. The patient will be responsible for cost sharing (co-pay) or up to 20% of the service fee (after annual deductible is met)  Following is a copy of your full provider care plan:   Goals Addressed             This Visit's Progress    CCM (DIABETES) EXPECTED OUTCOME: MONITOR, SELF-MANAGE AND REDUCE SYMPTOMS OF DIABETES       Current Barriers:  Knowledge Deficits related to Diabetes Chronic Disease Management support and education needs related to management of Diabetes, diet Patient reports she lives alone, has CAP aide 8 hours per day/ 7 days per week and she provides transportation as well as church members, patient reports she checks CBG QID with fasting ranges low 100's recently with reading 111  today, random readings in 100's with highest reading of 159, (states no low readings recently)  (has [employer] and states she has decided not to use) feels this is too much trouble, Patient states her aide is not there during this phone call, sometimes leaves for errands. Patient reports she has had no recent falls since August 2023. Patient states nurse from [facility] comes in q 2 weeks to prefill medications and assist with managing medications, pt reports she is taking as prescribed.  Pt has needed DME - WC, cane, walker, hospital bed. Patient reports she now has CPAP machine and is using nightly without any issues  Planned Interventions: Reviewed medications with patient and discussed importance of medication adherence;        Reviewed prescribed diet with patient carbohydrate modified; Counseled on importance of regular laboratory monitoring as prescribed;        Advised patient, providing education and rationale, to check cbg per doctor's order  and record        call provider for findings outside established parameters;       Review of patient status, including review of consultants reports, relevant laboratory and other test results, and medications completed;       Reviewed with patient the importance of taking medications as prescribed, do not skip doses, do not double up, do not swap medication around after home health RN has prefilled in medication box Reviewed with patient if she wants to lock up the medications as she says she does sometimes, to make sure and unlock at correct time and take  medication as prescribed, pt verbalizes understanding Reviewed importance of using CPAP as prescribed  Symptom Management: Take medications as prescribed   Attend all scheduled provider appointments Call pharmacy for medication refills 3-7 days in advance of running out of medications Attend church or other social activities Call provider office for new concerns or questions  check blood  sugar at prescribed times: 4 times daily check feet daily for cuts, sores or redness enter blood sugar readings and medication or insulin into daily log take the blood sugar log to all doctor visits take the blood sugar meter to all doctor visits trim toenails straight across fill half of plate with vegetables manage portion size prepare main meal at home 3 to 5 days each week read food labels for fat, fiber, carbohydrates and portion size wash and dry feet carefully every day wear comfortable, cotton socks wear comfortable, well-fitting shoes Continue using CPAP machine as prescribed Take the medication as it is in prefilled med box, you can watch home health nurse prefill the med box and ask questions if needed  Follow Up Plan: Telephone follow up appointment with care management team member scheduled for:  07/07/22 at 130 pm       CCM (HYPERTENSION)  EXPECTED OUTCOME:  MONITOR,SELF- MANAGE AND REDUCE SYMPTOMS OF HYPERTENSION       Current Barriers:  Knowledge Deficits related to Hypertension Chronic Disease Management support and education needs related to management of Hypertension Patient reports her aide monitors blood pressure 7 days per week, states readings "vary from day to day but usually pretty good", pt states she is able to check her own blood pressure Patient reports she does smoke a "few cigarettes" per day but declines smoking cessation, states she continues to weigh daily for CHF Patient reports she continues following up with counselor Maurice Small and spoke with her today, feels this is helping and states " I can get my feelings out"  Planned Interventions: Evaluation of current treatment plan related to hypertension self management and patient's adherence to plan as established by provider;   Reviewed medications with patient and discussed importance of compliance;  Counseled on the importance of exercise goals with target of 150 minutes per week Discussed plans with  patient for ongoing care management follow up and provided patient with direct contact information for care management team; Provided education on prescribed diet low sodium and importance of reading labels;  Discussed complications of poorly controlled blood pressure such as heart disease, stroke, circulatory complications, vision complications, kidney impairment, sexual dysfunction;   Symptom Management: Take medications as prescribed   Attend all scheduled provider appointments Call pharmacy for medication refills 3-7 days in advance of running out of medications Attend church or other social activities Call provider office for new concerns or questions  check blood pressure 3 times per week choose a place to take my blood pressure (home, clinic or office, retail store) write blood pressure results in a log or diary learn about high blood pressure take blood pressure log to all doctor appointments call doctor for signs and symptoms of high blood pressure keep all doctor appointments take medications for blood pressure exactly as prescribed begin an exercise program report new symptoms to your doctor eat more whole grains, fruits and vegetables, lean meats and healthy fats Continue to follow low salt diet- read labels Continue following up with your counselor Continue to weigh daily  Follow Up Plan: Telephone follow up appointment with care management team member scheduled for:  07/07/22  at 130 pm          Patient verbalizes understanding of instructions and care plan provided today and agrees to view in [facility]. Active MyChart status and patient understanding of how to access instructions and care plan via MyChart confirmed with patient.     Telephone follow up appointment with care management team member scheduled for:  07/07/22 at 130 pm

## 2022-05-11 NOTE — Progress Notes (Signed)
Virtual Visit via Telephone Note  I connected with [name] on 05/11/22 at 1:15 PM EST  by telephone and verified that I am speaking with the correct person using two identifiers.  Location: Patient: Home Provider: [place] office    I discussed the limitations, risks, security and privacy concerns of performing an evaluation and management service by telephone and the availability of in person appointments. I also discussed with the patient that there may be a patient responsible charge related to this service. The patient expressed understanding and agreed to proceed.    I provided 33 minutes of non-face-to-face time during this encounter.   [name], LCSW             THERAPIST PROGRESS NOTE    Session Time: Thursday 05/11/2022 1:15 PM - 1:48 PM   Participation Level: Active  Behavioral Response: less depressed, talkative,   Type of Therapy: Individual Therapy  Treatment Goals addressed: Patient will score less than 10 on the patient health questionnaire  Progress on goals: not Progressing  Interventions: CBT and Supportive             Summary: [name] is a 72 y.o. female who presents with  long standing history of recurrent periods  of depression beginning when she was 40 and her favorite uncle died. Patient reports multiple psychiatric hospitlaizations due to depression and suicidal ideations with the last one occuring in 1997. Patient has participated in outpatient psychotherapy and medication management intermittently since age 47.  She currently is seeing psychiatrist Dr. Harrington Challenger . Prior to this, she was seen at [facility]. Patient also has had ECT at [facility]. Symptoms have worsened in recent months due to family stress and have  included depressed mood, anxiety, excessive worry, and tearfulness.            Patient's last contact was by virtual visit via telephone about 2 weeks ago.  Patient reports increased  symptoms of depression as  reflected in the PHQ 2 and 9.  She identifies increased shoulder pain after recent fall as the trigger.  She has not been able to go out as much as she has in the past.  Per patient's report, she still tries to attend Sunday Bible study on the phone also in the evenings but has not been able to actually go to church for the past 2 Sundays.  She reports decreased interest and pleasure in activities for the past 2 weeks.  She still expresses a desire to improve interaction with others.  She expresses frustration that when she does go out, she thinks other people may be talking about her or laughing at her.  Therapist and patient in the session abruptly as patient has a schedule conflict due to an appointment with her PCP.   Suicidal/Homicidal: Nowithout intent/plan      Therapist Response: Reviewed symptoms, administered PHQ 2 and 9, assisted patient identify triggers of increased symptoms of depression, discussed stressors, facilitated expression of thoughts and feelings, validated feelings, tried to assist patient identify how she would like to change her interactions with others, will discuss more at next session  Diagnosis: Axis I: MDD, Recurrent, moderate    Axis II: Deferred Collaboration of Care: Psychiatrist AEB patient working with psychiatrist Dr. Harrington Challenger  Patient/Guardian was advised Release of Information must be obtained prior to any record release in order to collaborate their care with an outside provider. Patient/Guardian was advised if they have not already done so to  contact the registration department to sign all necessary forms in order for [name] to release information regarding their care.   Consent: Patient/Guardian gives verbal consent for treatment and assignment of benefits for services provided during this visit. Patient/Guardian expressed understanding and agreed to proceed.    Amauria Younts E Jeliyah Middlebrooks, LCSW

## 2022-05-11 NOTE — Chronic Care Management (AMB) (Signed)
Chronic Care Management   CCM RN Visit Note  05/11/2022 Name: [name] MRN: [id number] DOB: 08/15/1950  Subjective: [name] is a 72 y.o. year old female who is a primary care patient of [name], MD. The patient was referred to the Chronic Care Management team for assistance with care management needs subsequent to provider initiation of CCM services and plan of care.    Today's Visit:  Engaged with patient by telephone for follow up visit.        Goals Addressed             This Visit's Progress    CCM (DIABETES) EXPECTED OUTCOME: MONITOR, SELF-MANAGE AND REDUCE SYMPTOMS OF DIABETES       Current Barriers:  Knowledge Deficits related to Diabetes Chronic Disease Management support and education needs related to management of Diabetes, diet Patient reports she lives alone, has CAP aide 8 hours per day/ 7 days per week and she provides transportation as well as church members, patient reports she checks CBG QID with fasting ranges low 100's recently with reading 111 today, random readings in 100's with highest reading of 159, (states no low readings recently)  (has [employer] and states she has decided not to use) feels this is too much trouble, Patient states her aide is not there during this phone call, sometimes leaves for errands. Patient reports she has had no recent falls since August 2023. Patient states nurse from [facility] comes in q 2 weeks to prefill medications and assist with managing medications, pt reports she is taking as prescribed.  Pt has needed DME - WC, cane, walker, hospital bed. Patient reports she now has CPAP machine and is using nightly without any issues  Planned Interventions: Reviewed medications with patient and discussed importance of medication adherence;        Reviewed prescribed diet with patient carbohydrate modified; Counseled on importance of regular laboratory monitoring as prescribed;        Advised patient, providing  education and rationale, to check cbg per doctor's order  and record        call provider for findings outside established parameters;       Review of patient status, including review of consultants reports, relevant laboratory and other test results, and medications completed;       Reviewed with patient the importance of taking medications as prescribed, do not skip doses, do not double up, do not swap medication around after home health RN has prefilled in medication box Reviewed with patient if she wants to lock up the medications as she says she does sometimes, to make sure and unlock at correct time and take medication as prescribed, pt verbalizes understanding Reviewed importance of using CPAP as prescribed  Symptom Management: Take medications as prescribed   Attend all scheduled provider appointments Call pharmacy for medication refills 3-7 days in advance of running out of medications Attend church or other social activities Call provider office for new concerns or questions  check blood sugar at prescribed times: 4 times daily check feet daily for cuts, sores or redness enter blood sugar readings and medication or insulin into daily log take the blood sugar log to all doctor visits take the blood sugar meter to all doctor visits trim toenails straight across fill half of plate with vegetables manage portion size prepare main meal at home 3 to 5 days each week read food labels for fat, fiber, carbohydrates and portion size wash and dry feet  carefully every day wear comfortable, cotton socks wear comfortable, well-fitting shoes Continue using CPAP machine as prescribed Take the medication as it is in prefilled med box, you can watch home health nurse prefill the med box and ask questions if needed  Follow Up Plan: Telephone follow up appointment with care management team member scheduled for:  07/07/22 at 130 pm       CCM (HYPERTENSION)  EXPECTED OUTCOME:  MONITOR,SELF- MANAGE  AND REDUCE SYMPTOMS OF HYPERTENSION       Current Barriers:  Knowledge Deficits related to Hypertension Chronic Disease Management support and education needs related to management of Hypertension Patient reports her aide monitors blood pressure 7 days per week, states readings "vary from day to day but usually pretty good", pt states she is able to check her own blood pressure Patient reports she does smoke a "few cigarettes" per day but declines smoking cessation, states she continues to weigh daily for CHF Patient reports she continues following up with counselor [name] and spoke with her today, feels this is helping and states " I can get my feelings out"  Planned Interventions: Evaluation of current treatment plan related to hypertension self management and patient's adherence to plan as established by provider;   Reviewed medications with patient and discussed importance of compliance;  Counseled on the importance of exercise goals with target of 150 minutes per week Discussed plans with patient for ongoing care management follow up and provided patient with direct contact information for care management team; Provided education on prescribed diet low sodium and importance of reading labels;  Discussed complications of poorly controlled blood pressure such as heart disease, stroke, circulatory complications, vision complications, kidney impairment, sexual dysfunction;   Symptom Management: Take medications as prescribed   Attend all scheduled provider appointments Call pharmacy for medication refills 3-7 days in advance of running out of medications Attend church or other social activities Call provider office for new concerns or questions  check blood pressure 3 times per week choose a place to take my blood pressure (home, clinic or office, retail store) write blood pressure results in a log or diary learn about high blood pressure take blood pressure log to all doctor  appointments call doctor for signs and symptoms of high blood pressure keep all doctor appointments take medications for blood pressure exactly as prescribed begin an exercise program report new symptoms to your doctor eat more whole grains, fruits and vegetables, lean meats and healthy fats Continue to follow low salt diet- read labels Continue following up with your counselor Continue to weigh daily  Follow Up Plan: Telephone follow up appointment with care management team member scheduled for:  07/07/22 at 130 pm          Plan:Telephone follow up appointment with care management team member scheduled for:  07/07/22 at 130 pm  Jacqlyn [facility], BSN RN Case Manager [facility] [phone]

## 2022-05-12 ENCOUNTER — Inpatient Hospital Stay: Payer: Medicare HMO | Admitting: Physician Assistant

## 2022-05-12 ENCOUNTER — Inpatient Hospital Stay: Payer: Medicare HMO

## 2022-05-12 ENCOUNTER — Inpatient Hospital Stay: Payer: Medicare HMO | Attending: Hematology

## 2022-05-12 ENCOUNTER — Other Ambulatory Visit: Payer: Self-pay

## 2022-05-12 ENCOUNTER — Other Ambulatory Visit: Payer: Medicare HMO

## 2022-05-12 VITALS — BP 134/70 | HR 66 | Temp 97.2°F | Resp 20

## 2022-05-12 DIAGNOSIS — J449 Chronic obstructive pulmonary disease, unspecified: Secondary | ICD-10-CM | POA: Insufficient documentation

## 2022-05-12 DIAGNOSIS — Z8719 Personal history of other diseases of the digestive system: Secondary | ICD-10-CM | POA: Insufficient documentation

## 2022-05-12 DIAGNOSIS — Z8673 Personal history of transient ischemic attack (TIA), and cerebral infarction without residual deficits: Secondary | ICD-10-CM | POA: Diagnosis not present

## 2022-05-12 DIAGNOSIS — D509 Iron deficiency anemia, unspecified: Secondary | ICD-10-CM

## 2022-05-12 DIAGNOSIS — K589 Irritable bowel syndrome without diarrhea: Secondary | ICD-10-CM | POA: Diagnosis not present

## 2022-05-12 DIAGNOSIS — G4730 Sleep apnea, unspecified: Secondary | ICD-10-CM | POA: Diagnosis not present

## 2022-05-12 DIAGNOSIS — K449 Diaphragmatic hernia without obstruction or gangrene: Secondary | ICD-10-CM | POA: Diagnosis not present

## 2022-05-12 DIAGNOSIS — G8929 Other chronic pain: Secondary | ICD-10-CM | POA: Insufficient documentation

## 2022-05-12 DIAGNOSIS — Z79899 Other long term (current) drug therapy: Secondary | ICD-10-CM | POA: Insufficient documentation

## 2022-05-12 DIAGNOSIS — N1832 Chronic kidney disease, stage 3b: Secondary | ICD-10-CM

## 2022-05-12 DIAGNOSIS — Z79890 Hormone replacement therapy (postmenopausal): Secondary | ICD-10-CM | POA: Diagnosis not present

## 2022-05-12 DIAGNOSIS — M797 Fibromyalgia: Secondary | ICD-10-CM | POA: Insufficient documentation

## 2022-05-12 DIAGNOSIS — K2100 Gastro-esophageal reflux disease with esophagitis, without bleeding: Secondary | ICD-10-CM | POA: Insufficient documentation

## 2022-05-12 DIAGNOSIS — M810 Age-related osteoporosis without current pathological fracture: Secondary | ICD-10-CM | POA: Diagnosis not present

## 2022-05-12 DIAGNOSIS — D631 Anemia in chronic kidney disease: Secondary | ICD-10-CM | POA: Insufficient documentation

## 2022-05-12 DIAGNOSIS — E119 Type 2 diabetes mellitus without complications: Secondary | ICD-10-CM | POA: Insufficient documentation

## 2022-05-12 DIAGNOSIS — I10 Essential (primary) hypertension: Secondary | ICD-10-CM | POA: Diagnosis not present

## 2022-05-12 DIAGNOSIS — Z7982 Long term (current) use of aspirin: Secondary | ICD-10-CM | POA: Diagnosis not present

## 2022-05-12 DIAGNOSIS — Z794 Long term (current) use of insulin: Secondary | ICD-10-CM | POA: Insufficient documentation

## 2022-05-12 DIAGNOSIS — D638 Anemia in other chronic diseases classified elsewhere: Secondary | ICD-10-CM

## 2022-05-12 DIAGNOSIS — Z87891 Personal history of nicotine dependence: Secondary | ICD-10-CM | POA: Insufficient documentation

## 2022-05-12 DIAGNOSIS — I129 Hypertensive chronic kidney disease with stage 1 through stage 4 chronic kidney disease, or unspecified chronic kidney disease: Secondary | ICD-10-CM | POA: Diagnosis not present

## 2022-05-12 LAB — IRON AND TIBC
Iron: 99 ug/dL (ref 28–170)
Saturation Ratios: 26 % (ref 10.4–31.8)
TIBC: 378 ug/dL (ref 250–450)
UIBC: 279 ug/dL

## 2022-05-12 LAB — CBC WITH DIFFERENTIAL/PLATELET
Abs Immature Granulocytes: 0.05 10*3/uL (ref 0.00–0.07)
Basophils Absolute: 0 10*3/uL (ref 0.0–0.1)
Basophils Relative: 0 %
Eosinophils Absolute: 0.1 10*3/uL (ref 0.0–0.5)
Eosinophils Relative: 2 %
HCT: 37.5 % (ref 36.0–46.0)
Hemoglobin: 10.7 g/dL — ABNORMAL LOW (ref 12.0–15.0)
Immature Granulocytes: 1 %
Lymphocytes Relative: 21 %
Lymphs Abs: 1.5 10*3/uL (ref 0.7–4.0)
MCH: 22.6 pg — ABNORMAL LOW (ref 26.0–34.0)
MCHC: 28.5 g/dL — ABNORMAL LOW (ref 30.0–36.0)
MCV: 79.3 fL — ABNORMAL LOW (ref 80.0–100.0)
Monocytes Absolute: 0.7 10*3/uL (ref 0.1–1.0)
Monocytes Relative: 10 %
Neutro Abs: 4.9 10*3/uL (ref 1.7–7.7)
Neutrophils Relative %: 66 %
Platelets: 299 10*3/uL (ref 150–400)
RBC: 4.73 MIL/uL (ref 3.87–5.11)
RDW: 15.2 % (ref 11.5–15.5)
WBC: 7.3 10*3/uL (ref 4.0–10.5)
nRBC: 0 % (ref 0.0–0.2)

## 2022-05-12 LAB — COMPREHENSIVE METABOLIC PANEL
ALT: 22 U/L (ref 0–44)
AST: 23 U/L (ref 15–41)
Albumin: 3.9 g/dL (ref 3.5–5.0)
Alkaline Phosphatase: 88 U/L (ref 38–126)
Anion gap: 4 — ABNORMAL LOW (ref 5–15)
BUN: 51 mg/dL — ABNORMAL HIGH (ref 8–23)
CO2: 22 mmol/L (ref 22–32)
Calcium: 9.4 mg/dL (ref 8.9–10.3)
Chloride: 111 mmol/L (ref 98–111)
Creatinine, Ser: 1.56 mg/dL — ABNORMAL HIGH (ref 0.44–1.00)
GFR, Estimated: 35 mL/min — ABNORMAL LOW (ref >60)
Glucose, Bld: 163 mg/dL — ABNORMAL HIGH (ref 70–99)
Potassium: 4.6 mmol/L (ref 3.5–5.1)
Sodium: 137 mmol/L (ref 135–145)
Total Bilirubin: 0.4 mg/dL (ref 0.3–1.2)
Total Protein: 7.5 g/dL (ref 6.5–8.1)

## 2022-05-12 LAB — FERRITIN: Ferritin: 158 ng/mL (ref 11–307)

## 2022-05-12 MED ORDER — EPOETIN ALFA 10000 UNIT/ML IJ SOLN
10000.0000 [IU] | Freq: Once | INTRAMUSCULAR | Status: AC
  Administered 2022-05-12: 10000 [IU] via SUBCUTANEOUS
  Filled 2022-05-12: qty 1

## 2022-05-12 NOTE — Progress Notes (Signed)
Epogen injection given per orders. Patient tolerated it well without problems. Vitals stable and discharged home from clinic ambulatory. Follow up as scheduled.

## 2022-05-12 NOTE — Patient Instructions (Signed)
[facility]  Discharge Instructions: Thank you for choosing [facility] to provide your oncology and hematology care.  If you have a lab appointment with the [facility], please come in thru the Main Entrance and check in at the main information desk.  Wear comfortable clothing and clothing appropriate for easy access to any Portacath or PICC line.   We strive to give you quality time with your provider. You may need to reschedule your appointment if you arrive late (15 or more minutes).  Arriving late affects you and other patients whose appointments are after yours.  Also, if you miss three or more appointments without notifying the office, you may be dismissed from the clinic at the provider's discretion.      For prescription refill requests, have your pharmacy contact our office and allow 72 hours for refills to be completed.    Today you received the following epogen injection    To help prevent nausea and vomiting after your treatment, we encourage you to take your nausea medication as directed.  BELOW ARE SYMPTOMS THAT SHOULD BE REPORTED IMMEDIATELY: *FEVER GREATER THAN 100.4 F (38 C) OR HIGHER *CHILLS OR SWEATING *NAUSEA AND VOMITING THAT IS NOT CONTROLLED WITH YOUR NAUSEA MEDICATION *UNUSUAL SHORTNESS OF BREATH *UNUSUAL BRUISING OR BLEEDING *URINARY PROBLEMS (pain or burning when urinating, or frequent urination) *BOWEL PROBLEMS (unusual diarrhea, constipation, pain near the anus) TENDERNESS IN MOUTH AND THROAT WITH OR WITHOUT PRESENCE OF ULCERS (sore throat, sores in mouth, or a toothache) UNUSUAL RASH, SWELLING OR PAIN  UNUSUAL VAGINAL DISCHARGE OR ITCHING   Items with * indicate a potential emergency and should be followed up as soon as possible or go to the Emergency Department if any problems should occur.  Please show the CHEMOTHERAPY ALERT CARD or IMMUNOTHERAPY ALERT CARD at check-in to the Emergency Department and triage  nurse.  Should you have questions after your visit or need to cancel or reschedule your appointment, please contact [facility] [phone]  and follow the prompts.  Office hours are 8:00 a.m. to 4:30 p.m. Monday - Friday. Please note that voicemails left after 4:00 p.m. may not be returned until the following business day.  We are closed weekends and major holidays. You have access to a nurse at all times for urgent questions. Please call the main number to the clinic [phone] and follow the prompts.  For any non-urgent questions, you may also contact your provider using MyChart. We now offer e-Visits for anyone 4 and older to request care online for non-urgent symptoms. For details visit [email or web address].   Also download the MyChart app! Go to the app store, search "MyChart", open the app, select [facility], and log in with your MyChart username and password.

## 2022-05-16 ENCOUNTER — Telehealth: Payer: Self-pay | Admitting: Hematology

## 2022-05-16 ENCOUNTER — Telehealth: Payer: Self-pay | Admitting: Family Medicine

## 2022-05-16 ENCOUNTER — Encounter: Payer: Self-pay | Admitting: Hematology

## 2022-05-16 DIAGNOSIS — I130 Hypertensive heart and chronic kidney disease with heart failure and stage 1 through stage 4 chronic kidney disease, or unspecified chronic kidney disease: Secondary | ICD-10-CM | POA: Diagnosis not present

## 2022-05-16 DIAGNOSIS — Z794 Long term (current) use of insulin: Secondary | ICD-10-CM | POA: Diagnosis not present

## 2022-05-16 DIAGNOSIS — J449 Chronic obstructive pulmonary disease, unspecified: Secondary | ICD-10-CM | POA: Diagnosis not present

## 2022-05-16 DIAGNOSIS — E1122 Type 2 diabetes mellitus with diabetic chronic kidney disease: Secondary | ICD-10-CM | POA: Diagnosis not present

## 2022-05-16 DIAGNOSIS — D631 Anemia in chronic kidney disease: Secondary | ICD-10-CM | POA: Diagnosis not present

## 2022-05-16 DIAGNOSIS — I509 Heart failure, unspecified: Secondary | ICD-10-CM | POA: Diagnosis not present

## 2022-05-16 DIAGNOSIS — N1832 Chronic kidney disease, stage 3b: Secondary | ICD-10-CM | POA: Diagnosis not present

## 2022-05-16 DIAGNOSIS — F250 Schizoaffective disorder, bipolar type: Secondary | ICD-10-CM | POA: Diagnosis not present

## 2022-05-16 DIAGNOSIS — E1142 Type 2 diabetes mellitus with diabetic polyneuropathy: Secondary | ICD-10-CM | POA: Diagnosis not present

## 2022-05-16 NOTE — Telephone Encounter (Signed)
I'm not sure, will have to route to her provider to see what is recommended

## 2022-05-16 NOTE — Telephone Encounter (Signed)
Patient called in regard to Trapeze bar. Wants top see if she can get order sen in to [facility] (fax: [phone]) for trapeze bar due to lower back issues. Patient is having trouble getting up and down in bed.  Patient wants a call back in regard.

## 2022-05-16 NOTE — Telephone Encounter (Signed)
Patient states that she spoke with Old therapist with [facility]   Suggest that she goes back in to therapy for lower back and legs to help with up and down  [facility] phone # [phone]

## 2022-05-16 NOTE — Telephone Encounter (Signed)
Requested to speak to [name]   Pt wants to know what type of equipment she can get for back support?

## 2022-05-17 ENCOUNTER — Other Ambulatory Visit: Payer: Self-pay

## 2022-05-17 DIAGNOSIS — M4716 Other spondylosis with myelopathy, lumbar region: Secondary | ICD-10-CM

## 2022-05-17 DIAGNOSIS — R531 Weakness: Secondary | ICD-10-CM

## 2022-05-17 DIAGNOSIS — I69344 Monoplegia of lower limb following cerebral infarction affecting left non-dominant side: Secondary | ICD-10-CM

## 2022-05-17 NOTE — Telephone Encounter (Signed)
Called pt no answer. Sent mychart msg to active acct with providers response

## 2022-05-17 NOTE — Telephone Encounter (Signed)
Trapeze bar ordered thru [facility]

## 2022-05-17 NOTE — Telephone Encounter (Signed)
Referral sent 

## 2022-05-18 ENCOUNTER — Telehealth: Payer: Self-pay | Admitting: Family Medicine

## 2022-05-18 ENCOUNTER — Telehealth: Payer: Self-pay | Admitting: *Deleted

## 2022-05-18 DIAGNOSIS — N189 Chronic kidney disease, unspecified: Secondary | ICD-10-CM | POA: Diagnosis not present

## 2022-05-18 DIAGNOSIS — E1129 Type 2 diabetes mellitus with other diabetic kidney complication: Secondary | ICD-10-CM | POA: Diagnosis not present

## 2022-05-18 DIAGNOSIS — E870 Hyperosmolality and hypernatremia: Secondary | ICD-10-CM | POA: Diagnosis not present

## 2022-05-18 DIAGNOSIS — I5032 Chronic diastolic (congestive) heart failure: Secondary | ICD-10-CM | POA: Diagnosis not present

## 2022-05-18 DIAGNOSIS — I129 Hypertensive chronic kidney disease with stage 1 through stage 4 chronic kidney disease, or unspecified chronic kidney disease: Secondary | ICD-10-CM | POA: Diagnosis not present

## 2022-05-18 DIAGNOSIS — E1122 Type 2 diabetes mellitus with diabetic chronic kidney disease: Secondary | ICD-10-CM | POA: Diagnosis not present

## 2022-05-18 DIAGNOSIS — R809 Proteinuria, unspecified: Secondary | ICD-10-CM | POA: Diagnosis not present

## 2022-05-18 LAB — LAB REPORT - SCANNED
A1c: 5.9
EGFR: 47

## 2022-05-18 NOTE — Telephone Encounter (Signed)
Pt wants to know if nurse can please give her a call?

## 2022-05-18 NOTE — Telephone Encounter (Signed)
Patient called concerned about how she is going to get to her MRI appointment tomorrow. She states that she usually uses [facility] transportation for [employer] [place].  She is unable to reach them to let them know how important this MRI appt is.    I also called them and left a detailed message [phone] explaining the need, appt details and location and asked that they call the patient back to advise if transportation would be provided.  Advised if she did not get a returned call from them by 10:00 tomorrow to call our office as her MRI might need to be rescheduled if she can not find a way to the appointment.  She stated understanding.

## 2022-05-19 ENCOUNTER — Ambulatory Visit (HOSPITAL_COMMUNITY)
Admission: RE | Admit: 2022-05-19 | Discharge: 2022-05-19 | Disposition: A | Discharge status: Home / Self Care | Payer: Medicare HMO | Source: Ambulatory Visit | Attending: Internal Medicine | Admitting: Internal Medicine

## 2022-05-19 ENCOUNTER — Telehealth: Payer: Self-pay | Admitting: Family Medicine

## 2022-05-19 ENCOUNTER — Telehealth: Payer: Self-pay

## 2022-05-19 DIAGNOSIS — G319 Degenerative disease of nervous system, unspecified: Secondary | ICD-10-CM | POA: Diagnosis not present

## 2022-05-19 DIAGNOSIS — D329 Benign neoplasm of meninges, unspecified: Secondary | ICD-10-CM | POA: Insufficient documentation

## 2022-05-19 MED ORDER — GADOBUTROL 1 MMOL/ML IV SOLN
7.5000 mL | Freq: Once | INTRAVENOUS | Status: AC | PRN
  Administered 2022-05-19: 7.5 mL via INTRAVENOUS

## 2022-05-19 NOTE — Telephone Encounter (Signed)
Trapeze bar sent to [facility]

## 2022-05-19 NOTE — Telephone Encounter (Signed)
Patient asking did some type of ball help her get out of bed to [facility]. Also nurse come out for her back.

## 2022-05-19 NOTE — Telephone Encounter (Signed)
Patient called stating she did not have transportation set up for MRI appointment today, 05/19/22 at 2:30 PM. Sent fax to [facility] [phone], requesting transportation to today's apt.

## 2022-05-22 ENCOUNTER — Ambulatory Visit (INDEPENDENT_AMBULATORY_CARE_PROVIDER_SITE_OTHER): Payer: Medicare HMO | Admitting: Internal Medicine

## 2022-05-22 ENCOUNTER — Encounter: Payer: Self-pay | Admitting: Internal Medicine

## 2022-05-22 ENCOUNTER — Inpatient Hospital Stay: Payer: Medicare HMO

## 2022-05-22 VITALS — BP 153/76 | HR 68 | Resp 16 | Ht 59.0 in | Wt 166.0 lb

## 2022-05-22 DIAGNOSIS — M25512 Pain in left shoulder: Secondary | ICD-10-CM

## 2022-05-22 DIAGNOSIS — G8911 Acute pain due to trauma: Secondary | ICD-10-CM | POA: Diagnosis not present

## 2022-05-22 DIAGNOSIS — Z0279 Encounter for issue of other medical certificate: Secondary | ICD-10-CM | POA: Diagnosis not present

## 2022-05-22 MED ORDER — METHOCARBAMOL 750 MG PO TABS: 750.0000 mg | ORAL_TABLET | Freq: Three times a day (TID) | ORAL | 0 refills | Status: DC | PRN

## 2022-05-22 NOTE — Telephone Encounter (Signed)
Pt scheduled for appt this pm with Dr Court Joy

## 2022-05-22 NOTE — Patient Instructions (Signed)
Thank you, [name] for allowing [name] to provide your care today.   I have ordered the following labs for you:  MRI of your shoulder  I have prescribed a muscle relaxer to help with pain. As mentioned, please take when your CNA is with you. You may get drowsy , if you do stop taking this medication.     Tamsen Snider, M.D.

## 2022-05-22 NOTE — Progress Notes (Unsigned)
   HPI:[name] is a 72 y.o. female with CKD Stage 3, chronic pain syndrome, hx of lumbar back surgery, and chronic neck pain who presents for evaluation of left shoulder pain and left flank pain after a fall 1 month ago.  Patient reports she has been evaluated in the emergency department and also by her PCP.  Review shows she had a x-ray of her left shoulder and also a chest x-ray.  No acute fracture seen.  She continues to have pain. Currently on oxycodone and under the care of pain management. For the details of today's visit, please refer to the assessment and plan.  Physical Exam: Vitals:   05/22/22 1349  BP: (!) 153/76  Pulse: 68  Resp: 16  SpO2: 94%  Weight: 166 lb (75.3 kg)  Height: 4\' 11"  (1.499 m)   General: Centrally obese , sitting in chair accompanied by her CNA. Leaning to right side to make herself comfortable Shoulder: No obvious deformity or asymmetry.  No focal TTP Decreased ROM in flexion, abduction, internal/external rotation Pain limiting exam, weakness in external rotation and abduction    Assessment & Plan:   [name] was seen today for left shoulder pain and side pain  Acute pain of left shoulder due to trauma Assessment & Plan: No acute fracture on recent xray. She has moderate arthritis of glenohumeral joint. Continues to have significant pain in left shoulder and limited ROM. Also having pain around left side. No fractures seen on chest xray. No focal area of tenderness on exam. I expect this is muscle strain at this time. Will send for MRI of left shoulder as this is her most focal pain and she has limited ROM. Patient being followed by pain management and taking oxycodone for pain. Also on other sedating medications. I think she might benefit from a course of muscle relaxer, but warned patient of sedating effects. She will take medication for the first time when she is with her CNA. She reassures me she has taken muscle relaxer and doubts it will effect  her this way.   Orders: -     MR SHOULDER LEFT WO CONTRAST; Future -     Methocarbamol; Take 1 tablet (750 mg total) by mouth every 8 (eight) hours as needed for muscle spasms.  Dispense: 21 tablet; Refill: 0      Lorene Dy, MD

## 2022-05-23 ENCOUNTER — Telehealth: Payer: Self-pay | Admitting: Family Medicine

## 2022-05-23 NOTE — Telephone Encounter (Signed)
[name] called and said can she "have her side tested when she has the other test at [facility]."  She said she told Dr Court Joy her side was also hurting.

## 2022-05-23 NOTE — Assessment & Plan Note (Signed)
No acute fracture on recent xray. She has moderate arthritis of glenohumeral joint. Continues to have significant pain in left shoulder and limited ROM. Also having pain around left side. No fractures seen on chest xray. No focal area of tenderness on exam. I expect this is muscle strain at this time. Will send for MRI of left shoulder as this is her most focal pain and she has limited ROM. Patient being followed by pain management and taking oxycodone for pain. Also on other sedating medications. I think she might benefit from a course of muscle relaxer, but warned patient of sedating effects. She will take medication for the first time when she is with her CNA. She reassures me she has taken muscle relaxer and doubts it will effect her this way.

## 2022-05-25 ENCOUNTER — Ambulatory Visit (INDEPENDENT_AMBULATORY_CARE_PROVIDER_SITE_OTHER): Payer: Medicare HMO | Admitting: Psychiatry

## 2022-05-25 DIAGNOSIS — F331 Major depressive disorder, recurrent, moderate: Secondary | ICD-10-CM

## 2022-05-25 NOTE — Progress Notes (Signed)
Virtual Visit via Telephone Note  I connected with [name] on 05/25/22 at 1:04 PM EDT by telephone and verified that I am speaking with the correct person using two identifiers.  Location: Patient: Home Provider: [facility] office    I discussed the limitations, risks, security and privacy concerns of performing an evaluation and management service by telephone and the availability of in person appointments. I also discussed with the patient that there may be a patient responsible charge related to this service. The patient expressed understanding and agreed to proceed.   I provided 44 minutes of non-face-to-face time during this encounter.   [name], LCSW              THERAPIST PROGRESS NOTE    Session Time: Thursday 05/25/2022 1:04 PM - 1:48 PM   Participation Level: Active  Behavioral Response: less depressed, talkative,   Type of Therapy: Individual Therapy  Treatment Goals addressed: Patient will score less than 10 on the patient health questionnaireAnnie "[place]" WILL IDENTIFY 3 COGNITIVE PATTERNS AND BELIEFS THAT SUPPORT DEPRESSION   Progress on goals: Progressing  Interventions: CBT and Supportive             Summary: [name] is a 72 y.o. female who presents with  long standing history of recurrent periods  of depression beginning when she was 46 and her favorite uncle died. Patient reports multiple psychiatric hospitlaizations due to depression and suicidal ideations with the last one occuring in 1997. Patient has participated in outpatient psychotherapy and medication management intermittently since age 10.  She currently is seeing psychiatrist Dr. Harrington Challenger . Prior to this, she was seen at [facility]. Patient also has had ECT at [facility]. Symptoms have worsened in recent months due to family stress and have  included depressed mood, anxiety, excessive worry, and tearfulness.            Patient's last contact was by virtual visit via  telephone about 2 weeks ago.  Patient reports continued  symptoms of depression as reflected in the PHQ 2 and 9.  She continues to attribute shoulder pain along with increased pain in her side as possible triggers.  She continues to take medication, use a heating pad, and engage in distracting activities to try to cope with pain.  She patient has not been able to participate in activities outside of her home such as going to the grocery store due to the pain.  However, she reports pushing herself to attend church this past Sunday.  She reports enjoying being at church but experiencing significant pain and was not able to enjoy it is much as she normally would have.  She still reports thoughts of other people may be talking about her or be judgmental when she goes out.   Suicidal/Homicidal: Nowithout intent/plan      Therapist Response: Reviewed symptoms, administered PHQ 2 and 9, discussed results, discussed stressors, facilitated expression of thoughts and feelings, validated feelings, praised and reinforced patient's efforts to use healthy coping strategies to cope with pain,  praised and reinforced patient's efforts to try to attend church, discussed effects, began to assist patient examine her thought patterns about being around other people, began to assist patient identify the effects of thought patterns on mood and behavior, reviewed treatment plan, obtained patient's permission to electronically sign plan as this was a virtual visit, will send patient copy of treatment plan along with handouts on CBT model/thought patterns in preparation for next session  Diagnosis: Axis I: MDD, Recurrent, moderate    Axis II: Deferred Collaboration of Care: Psychiatrist AEB patient working with psychiatrist Dr. Harrington Challenger  Patient/Guardian was advised Release of Information must be obtained prior to any record release in order to collaborate their care with an outside provider. Patient/Guardian was advised if they have  not already done so to contact the registration department to sign all necessary forms in order for [name] to release information regarding their care.   Consent: Patient/Guardian gives verbal consent for treatment and assignment of benefits for services provided during this visit. Patient/Guardian expressed understanding and agreed to proceed.    [name] Enke E Nykayla Marcelli, LCSW

## 2022-05-25 NOTE — Telephone Encounter (Signed)
Not sure what to do about this request. Pt scheduled for MRI of shoulder

## 2022-05-26 DIAGNOSIS — F250 Schizoaffective disorder, bipolar type: Secondary | ICD-10-CM | POA: Diagnosis not present

## 2022-05-26 DIAGNOSIS — I509 Heart failure, unspecified: Secondary | ICD-10-CM | POA: Diagnosis not present

## 2022-05-26 DIAGNOSIS — Z794 Long term (current) use of insulin: Secondary | ICD-10-CM | POA: Diagnosis not present

## 2022-05-26 DIAGNOSIS — E1122 Type 2 diabetes mellitus with diabetic chronic kidney disease: Secondary | ICD-10-CM | POA: Diagnosis not present

## 2022-05-26 DIAGNOSIS — E1142 Type 2 diabetes mellitus with diabetic polyneuropathy: Secondary | ICD-10-CM | POA: Diagnosis not present

## 2022-05-26 DIAGNOSIS — I130 Hypertensive heart and chronic kidney disease with heart failure and stage 1 through stage 4 chronic kidney disease, or unspecified chronic kidney disease: Secondary | ICD-10-CM | POA: Diagnosis not present

## 2022-05-26 DIAGNOSIS — N1832 Chronic kidney disease, stage 3b: Secondary | ICD-10-CM | POA: Diagnosis not present

## 2022-05-26 DIAGNOSIS — D631 Anemia in chronic kidney disease: Secondary | ICD-10-CM | POA: Diagnosis not present

## 2022-05-26 DIAGNOSIS — J449 Chronic obstructive pulmonary disease, unspecified: Secondary | ICD-10-CM | POA: Diagnosis not present

## 2022-05-29 ENCOUNTER — Other Ambulatory Visit: Payer: Medicare HMO

## 2022-05-29 ENCOUNTER — Ambulatory Visit: Payer: Medicare HMO | Admitting: Physician Assistant

## 2022-05-29 ENCOUNTER — Ambulatory Visit: Payer: Medicare HMO

## 2022-05-29 ENCOUNTER — Inpatient Hospital Stay: Payer: Medicare HMO

## 2022-05-29 NOTE — Progress Notes (Signed)
[facility] [address], [place] [address]    Clinic Day:  05/30/2022  Referring physician: [name], MD  Patient Care Team: [name], MD as PCP - General (Family Medicine) Gala Romney, Cristopher Estimable, MD (Gastroenterology) Deneise Lever, MD as Consulting Physician (Pulmonary Disease) Jovita Gamma, MD as Consulting Physician (Neurosurgery) Suella Broad, MD as Consulting Physician (Physical Medicine and Rehabilitation) Pieter Partridge, DO as Consulting Physician (Neurology) Lavonna Monarch, MD (Inactive) as Consulting Physician (Dermatology) Leta Baptist, MD as Consulting Physician (Otolaryngology) Latanya Maudlin, MD as Consulting Physician (Orthopedic Surgery) Cloria Spring, MD as Consulting Physician ([place]) Melina Schools, MD as Consulting Physician (Orthopedic Surgery) Lendon Colonel, NP as Nurse Practitioner (Nurse Practitioner) Milus Banister, MD as Attending Physician (Gastroenterology) Pyrtle, Lajuan Lines, MD as Consulting Physician (Gastroenterology) Monico Hoar, PT as Physical Therapist (Physical Therapy) Conrad [place], MD as Consulting Physician (Vascular Surgery) Inocencio Homes, DPM as Consulting Physician (Podiatry) Newt Minion, MD as Consulting Physician (Orthopedic Surgery) Lavonna Monarch, MD (Inactive) as Consulting Physician (Dermatology) Beryle Lathe, [facility] (Inactive) (Pharmacist) Sueanne Margarita, MD as Consulting Physician (Cardiology) Kassie Mends, RN as [facility]   ASSESSMENT & PLAN:   Assessment: 1.  Microcytic anemia: -This is a combination anemia from iron deficiency and chronic kidney disease. -Colonoscopy on 02/10/2016 showing 2 sessile polyps in the descending colon, diverticula in the sigmoid colon. -EGD done on 02/10/2016 showing mild to moderate esophagitis, erythema in the inferior gastric antrum. - She was placed on iron pills, but could not tolerate  them due to constipation. - Last Venofer was in June 2023. - Epogen 10,000 units every 3 weeks started on 11/02/2021.  Plan: 1.  Microcytic anemia: - She denies any bleeding per rectum or melena. -.  Reviewed labs from 05/12/2022.  Ferritin is 158 and percent saturation 26.  Hemoglobin is 10.7.  She reports continued fatigue. - I have recommended Venofer 500 mg IV x 1. - She will continue Retacrit 10,000 units every 3 weeks. - She will be reevaluated in 3 months with repeat labs.  Orders Placed This Encounter  Procedures   CBC    Standing Status:   Future    Standing Expiration Date:   05/30/2023   Ferritin    Standing Status:   Future    Standing Expiration Date:   05/30/2023   Iron and TIBC ([place] [facility] ONLY)    Standing Status:   Future    Standing Expiration Date:   05/30/2023      I,Alexis Herring,acting as a scribe for Derek Jack, MD.,have documented all relevant documentation on the behalf of Derek Jack, MD,as directed by  Derek Jack, MD while in the presence of Derek Jack, MD.   I, Derek Jack MD, have reviewed the above documentation for accuracy and completeness, and I agree with the above.   Derek Jack, MD   3/19/20245:21 PM  CHIEF COMPLAINT:   Diagnosis: microcytic anemia   Prior Therapy: iron tablets  Current Therapy:   Intermittent Feraheme last on 02/19/2020   INTERVAL HISTORY:   [name] is a 72 y.o. female presenting to clinic today for follow up of microcytic anemia . She was last seen by me in 05/2020. Patient was most recently seen by PA Pennington on 01/27/22.  Today, she states that she is doing well overall. Her appetite level is at 50%. Her energy level is at 20%. She denies any bleeding per  rectum or melena.   PAST MEDICAL HISTORY:   Past Medical History: Past Medical History:  Diagnosis Date   Allergy    Anemia    Anemia in chronic kidney disease (CKD) 08/10/2021   Anemia  in chronic kidney disease (CKD) 10/12/2021   Anxiety    takes Ativan daily   Arthritis    Assistance needed for mobility    Bipolar disorder ([place])    takes Risperdal nightly   Blood transfusion    Brain tumor ([place])    Cancer ([place])    In her gum   Carpal tunnel syndrome of right wrist 05/23/2011   Cervical disc disorder with radiculopathy of cervical region 10/31/2012   Chronic back pain    Chronic idiopathic constipation    Chronic neck and back pain    Colon polyps    COPD (chronic obstructive pulmonary disease) with chronic bronchitis 09/16/2013   Office Spirometry 10/30/2013-submaximal effort based on appearance of loop and curve. Numbers would fit with severe restriction but her physiologic capability may be better than this. FVC 0.91/44%, and 10.74/45%, FEV1/FVC 0.81, FEF 25-75% 1.43/69%     Diabetes mellitus    Type II   Diverticulosis    TCS 9/08 by Dr. Delfin Edis for diarrhea . Bx for micro scopic colitis negative.    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   Metabolic encephalopathy [id number]   Migraines    chronic headaches   Mononeuritis lower limb    Narcolepsy    Osteoporosis    Pancreatitis 2006   due to Depakote with normal EUS    Paralysis (HCC)    Schatzki's ring    non critical / EGD with ED 8/2011with RMR   Seizures ([place])    takes Lamictal daily.Last seizure 3 yrs ago   Sleep apnea    on CPAP   Small bowel obstruction (HCC)    Stroke ([place])    left sided weakness, speech changes    Tubular adenoma of colon     Surgical History: Past Surgical History:  Procedure Laterality Date   ABDOMINAL HYSTERECTOMY  1978   BACK SURGERY  July 2012   BACTERIAL OVERGROWTH TEST N/A 05/05/2013   Procedure: BACTERIAL OVERGROWTH TEST;  Surgeon: Daneil Dolin, MD;  [facility];  Service: Endoscopy;  Laterality: N/A;  7:30   BIOPSY THYROID  2009   BRAIN SURGERY  11/2011   resection of meningioma   BREAST REDUCTION SURGERY  1994   CARDIAC CATHETERIZATION  05/10/2005   normal coronaries, normal LV systolic function and EF (Dr. Jackie Plum)   [place] Left 07/22/04   Dr. Aline Brochure   CATARACT EXTRACTION Bilateral    CHOLECYSTECTOMY  1984   COLONOSCOPY N/A 09/25/2012   [id number] diverticulosis.  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 Hiatal hernia   GIVENS CAPSULE STUDY N/A 01/15/2013   NORMAL.    IR GENERIC HISTORICAL  03/17/2016   IR RADIOLOGIST EVAL & MGMT 03/17/2016 [facility]-INTER[facility] RAD   LESION REMOVAL N/A 05/31/2015   Procedure: REMOVAL RIGHT AND LEFT LESIONS OF MANDIBLE;  Surgeon: Diona Browner, DDS;  Location: [facility];  Service: Oral Surgery;  Laterality: N/A;   MALONEY DILATION  12/29/2010   RMR;   NM MYOCAR PERF WALL MOTION  2006   "relavtiely normal" persantine, mild anterior thinning (breast attenuation artifact), no region of scar/ischemia   OVARIAN CYST REMOVAL     RECTOCELE REPAIR N/A 06/29/2015   Procedure: POSTERIOR REPAIR (RECTOCELE);  Surgeon: Jonnie Kind, MD;  Location: [facility];  Service:  Gynecology;  Laterality: N/A;   REDUCTION MAMMAPLASTY Bilateral    SPINE SURGERY  09/29/2010   Dr. Rolena Infante   surgical excision of 3 tumors from right thigh and right buttock  and left upper thigh  2010   TOOTH EXTRACTION Bilateral 12/14/2014   Procedure: REMOVAL OF BILATERAL MANDIBULAR EXOSTOSES;  Surgeon: Diona Browner, DDS;  Location: [facility];  Service: Oral Surgery;  Laterality: Bilateral;   TRANSTHORACIC ECHOCARDIOGRAM  2010   EF 60-65%, mild conc LVH, grade 1 diastolic dysfunction; mildly calcified MV annulus with mildly thickened leaflets, mildly calcified MR annulus    Social History: Social History   Socioeconomic History   Marital status: Divorced    Spouse name: Not on file   Number of children: 1   Years of education: 12   Highest education level: High school graduate  Occupational History   Occupation: Disabled  Tobacco Use   Smoking status: Former    Packs/day: 0.25    Years: 30.00    Additional pack years: 0.00    Total pack years: 7.50    Types: Cigarettes    Quit date: 07/05/2021    Years since quitting: 0.9    Passive exposure: Past   Smokeless tobacco: Never   Tobacco comments:    3-4 a day MRC 05/17/2021  Vaping Use   Vaping Use: Never used  Substance and Sexual Activity   Alcohol use: No    Alcohol/week: 0.0 standard drinks of alcohol    Comment:     Drug use: No   Sexual activity: Not Currently  Other Topics Concern   Not on file  Social History Narrative   01/29/18 Lives alone, has 3 aides, Mon- Fri 8 hrs, 2 hrs on Sat-Sun, RN manages her meds   Caffeine use: Drink coffee sometimes    Right handed    Social Determinants of Health   Financial Resource Strain: Low Risk  (01/03/2022)   Overall Financial Resource Strain (CARDIA)    Difficulty of Paying Living Expenses: Not very hard  Food Insecurity: No Food Insecurity (01/03/2022)   Hunger Vital Sign    Worried About Running Out of Food in the Last Year: Never true    [place] in the Last  Year: Never true  Transportation Needs: No Transportation Needs (01/03/2022)   PRAPARE - Hydrologist (Medical): No    Lack of Transportation (Non-Medical): No  Physical Activity: Insufficiently Active (01/03/2022)  Exercise Vital Sign    Days of Exercise per Week: 2 days    Minutes of Exercise per Session: 20 min  Stress: No Stress Concern Present (01/03/2022)   [place]    Feeling of Stress : Only a little  Social Connections: Moderately Integrated (01/03/2022)   Social Connection and Isolation Panel [NHANES]    Frequency of Communication with Friends and Family: Three times a week    Frequency of Social Gatherings with Friends and Family: Once a week    Attends Religious Services: More than 4 times per year    Active Member of [employer] or Organizations: Yes    Attends Music therapist: More than 4 times per year    Marital Status: Divorced  Intimate Partner Violence: Not At Risk (05/21/2019)   Humiliation, Afraid, Rape, and Kick questionnaire    Fear of Current or Ex-Partner: No    Emotionally Abused: No    Physically Abused: No    Sexually Abused: No    Family History: Family History  Problem Relation Age of Onset   Heart attack Mother        HTN   Pneumonia Father    Kidney failure Father    Diabetes Father    Pancreatic cancer Sister    Cancer Sister        breast    Cancer Sister        pancreatic   Diabetes Brother    Hypertension Brother    Diabetes Brother    Alcohol abuse Maternal Uncle    Stroke Maternal Grandmother    Heart attack Maternal Grandfather    Hypertension Son    Sleep apnea Son    Colon cancer Neg Hx    Anesthesia problems Neg Hx    Hypotension Neg Hx    Malignant hyperthermia Neg Hx    Pseudochol deficiency Neg Hx    Breast cancer Neg Hx    Stomach cancer Neg Hx     Current Medications:  Current Outpatient Medications:     ACCU-CHEK GUIDE test strip, USE TO CHECK BLOOD SUGAR FOUR TIMES A DAY AND AS NEEDED (NEED MD APPOINTMENT FOR ANY FURTHER REFILLS), Disp: 450 strip, Rfl: 3   Accu-Chek Softclix Lancets lancets, TEST BLOOD SUGAR THREE TIMES DAILY AS DIRECTED, Disp: 300 each, Rfl: 2   acetaminophen (TYLENOL) 325 MG tablet, Take 650 mg by mouth every 6 (six) hours as needed., Disp: , Rfl:    albuterol (PROVENTIL) (2.5 MG/3ML) 0.083% nebulizer solution, Take 3 mLs (2.5 mg total) by nebulization every 6 (six) hours as needed for wheezing or shortness of breath., Disp: 75 mL, Rfl: 12   albuterol (VENTOLIN HFA) 108 (90 Base) MCG/ACT inhaler, INHALE 1 TO 2 PUFFS EVERY 6 HOURS AS NEEDED FOR WHEEZING, SHORTNESS OF BREATH (Patient taking differently: Inhale 1-2 puffs into the lungs every 6 (six) hours as needed for wheezing or shortness of breath.), Disp: 3 each, Rfl: 3   Alcohol Swabs (DROPSAFE ALCOHOL PREP) 70 % PADS, USE TO CLEAN FINGER PRIOR TO TESTING FOR BLOOD SUGAR AS DIRECTED, Disp: 300 each, Rfl: 3   alendronate (FOSAMAX) 70 MG tablet, TAKE 1 TABLET EVERY 7 DAYS ON AN EMPTY STOMACH WITH A FULL GLASS OF WATER (Patient taking differently: Take 70 mg by mouth once a week.), Disp: 12 tablet, Rfl: 3   amLODipine (NORVASC) 10 MG tablet, TAKE 1 TABLET EVERY DAY, Disp: 60 tablet, Rfl: 0   ascorbic acid (VITAMIN  C) 500 MG tablet, Take 500 mg by mouth daily., Disp: , Rfl:    aspirin EC 81 MG tablet, Take 1 tablet (81 mg total) by mouth daily with breakfast., Disp: 120 tablet, Rfl: 2   benzonatate (TESSALON) 200 MG capsule, Take 1 capsule (200 mg total) by mouth 3 (three) times daily as needed for cough., Disp: 40 capsule, Rfl: 0   Blood Glucose Calibration (ACCU-CHEK GUIDE CONTROL) LIQD, USE AS DIRECTED, Disp: 1 each, Rfl: 0   blood glucose meter kit and supplies, Dispense based on patient and insurance preference. Use up to four times daily as directed. (FOR ICD-10 E10.9, E11.9)., Disp: 1 each, Rfl: 0   buPROPion (WELLBUTRIN XL)  150 MG 24 hr tablet, Take 1 tablet (150 mg total) by mouth every morning., Disp: 90 tablet, Rfl: 2   Calcium Carb-Cholecalciferol (CALTRATE 600+D3 SOFT) 600-20 MG-MCG CHEW, Chew 1 tablet by mouth daily at 12 noon., Disp: , Rfl:    cetirizine (ZYRTEC) 10 MG tablet, Take 1 tablet (10 mg total) by mouth daily., Disp: 30 tablet, Rfl: 1   Cholecalciferol (D3 PO), Take 1 tablet by mouth daily., Disp: , Rfl:    Continuous Blood Gluc Sensor (FREESTYLE LIBRE 14 DAY SENSOR) MISC, 1 each by Does not apply route every 14 (fourteen) days. Change every 2 weeks, Disp: 2 each, Rfl: 11   diazepam (VALIUM) 5 MG tablet, Take 1 tablet (5 mg total) by mouth once as needed (MRI claustrophobia)., Disp: 6 tablet, Rfl: 0   Docusate Sodium (DSS) 100 MG CAPS, Take 1 tablet by mouth in the morning and at bedtime., Disp: , Rfl:    DROPLET PEN NEEDLES 31G X 8 MM MISC, USE FOR INJECTING INSULIN 4 TIMES DAILY., Disp: 400 each, Rfl: 0   Elastic Bandages & Supports (ADJUSTABLE ARM SLING) MISC, L arm sling, Disp: 1 each, Rfl: 0   ezetimibe (ZETIA) 10 MG tablet, TAKE 1 TABLET EVERY DAY, Disp: 90 tablet, Rfl: 10   insulin aspart (NOVOLOG FLEXPEN) 100 UNIT/ML FlexPen, Inject 5 Units into the skin 3 (three) times daily with meals. Give if eats 50% or more of meal. (Patient taking differently: Inject 14 Units into the skin 3 (three) times daily with meals. Give if eats 50% or more of meal.), Disp: 60 mL, Rfl: 0   ipratropium (ATROVENT) 0.03 % nasal spray, Place 2 sprays into both nostrils every 12 (twelve) hours., Disp: 30 mL, Rfl: 12   lactobacillus acidophilus (BACID) TABS tablet, Take 2 tablets by mouth 3 (three) times daily., Disp: , Rfl:    lamoTRIgine (LAMICTAL) 100 MG tablet, Take 1 tablet (100 mg total) by mouth 2 (two) times daily., Disp: 180 tablet, Rfl: 2   levothyroxine (SYNTHROID) 50 MCG tablet, TAKE 1 TABLET DAILY SIX DAYS A WEEK AND 1/2 TABLET ON ONE DAY A WEEK, Disp: 85 tablet, Rfl: 2   LORazepam (ATIVAN) 0.5 MG tablet, TAKE  1 TABLET TWICE DAILY, Disp: 60 tablet, Rfl: 2   methocarbamol (ROBAXIN-750) 750 MG tablet, Take 1 tablet (750 mg total) by mouth every 8 (eight) hours as needed for muscle spasms., Disp: 21 tablet, Rfl: 0   metoprolol tartrate (LOPRESSOR) 50 MG tablet, TAKE 1 TABLET TWICE DAILY (NEED MD APPOINTMENT) (Patient taking differently: Take 50 mg by mouth 2 (two) times daily.), Disp: 180 tablet, Rfl: 3   Misc. Devices (MATTRESS PAD) MISC, FIRM MATTRESS PAD for hospital bed x 1, Disp: 1 each, Rfl: 0   montelukast (SINGULAIR) 10 MG tablet, TAKE 1 TABLET EVERY DAY,  Disp: 90 tablet, Rfl: 3   Multiple Vitamins-Minerals (MULTIVITAMIN GUMMIES ADULT PO), Take 1 tablet by mouth daily., Disp: , Rfl:    MYRBETRIQ 25 MG TB24 tablet, TAKE 1 TABLET EVERY DAY, Disp: 90 tablet, Rfl: 3   Omega-3 Fatty Acids (FISH OIL PO), Take 1 capsule by mouth daily., Disp: , Rfl:    oxyCODONE-acetaminophen (PERCOCET) 10-325 MG tablet, Take 1 tablet by mouth every 8 (eight) hours as needed for pain., Disp: , Rfl:    potassium chloride (KLOR-CON) 10 MEQ tablet, Take 1 tablet (10 mEq total) by mouth 2 (two) times daily., Disp: 30 tablet, Rfl: 2   pregabalin (LYRICA) 75 MG capsule, Take 1 capsule (75 mg total) by mouth daily., Disp: , Rfl:    RABEprazole (ACIPHEX) 20 MG tablet, TAKE 1 TABLET TWICE DAILY, Disp: 180 tablet, Rfl: 2   RESTASIS 0.05 % ophthalmic emulsion, Place 2 drops into both eyes daily., Disp: , Rfl:    risperiDONE (RISPERDAL) 0.5 MG tablet, Take 1 tablet (0.5 mg total) by mouth at bedtime., Disp: 90 tablet, Rfl: 2   rosuvastatin (CRESTOR) 5 MG tablet, TAKE 1 TABLET AT BEDTIME., Disp: 90 tablet, Rfl: 1   sertraline (ZOLOFT) 100 MG tablet, Take 1 tablet (100 mg total) by mouth every morning., Disp: 90 tablet, Rfl: 2   SPIKEVAX injection, , Disp: , Rfl:    spironolactone (ALDACTONE) 50 MG tablet, TAKE 1 TABLET (50 MG TOTAL) BY MOUTH DAILY. DISCONTINUE SPIRONOLACTONE 25MG , Disp: 90 tablet, Rfl: 3   STIOLTO RESPIMAT 2.5-2.5  MCG/ACT AERS, INHALE 2 PUFFS INTO THE LUNGS DAILY., Disp: 12 g, Rfl: 1   telmisartan (MICARDIS) 20 MG tablet, Take 10 mg by mouth daily., Disp: , Rfl:    TOUJEO MAX SOLOSTAR 300 UNIT/ML Solostar Pen, INJECT 20-26 UNITS INTO THE SKIN DAILY., Disp: 12 mL, Rfl: 3   traZODone (DESYREL) 150 MG tablet, Take 1 tablet (150 mg total) by mouth at bedtime., Disp: 90 tablet, Rfl: 2   vitamin E 180 MG (400 UNITS) capsule, Take 400 Units by mouth daily., Disp: , Rfl:    Allergies: Allergies  Allergen Reactions   Iron Nausea And Vomiting    And itching And itching   Milk ([place]) Rash    Doesn't agree with stomach.  Doesn't agree with stomach.  Doesn't agree with stomach.    Penicillins Hives    Has patient had a PCN reaction causing immediate rash, facial/tongue/throat swelling, SOB or lightheadedness with hypotension: Yes Has patient had a PCN reaction causing severe rash involving mucus membranes or skin necrosis: No Has patient had a PCN reaction that required hospitalization No Has patient had a PCN reaction occurring within the last 10 years: No If all of the above answers are "NO", then may proceed with Cephalosporin use.  Other reaction(s): Other (see comments) Has patient had a PCN reaction causing immediate rash, facial/tongue/throat swelling, SOB or lightheadedness with hypotension: Yes Has patient had a PCN reaction causing severe rash involving mucus membranes or skin necrosis: No Has patient had a PCN reaction that required hospitalization No Has patient had a PCN reaction occurring within the last 10 years: No If all of the above answers are "NO", then may proceed with Cephalosporin use. Has patient had a PCN reaction causing immediate rash, facial/tongue/throat swelling, SOB or lightheadedness with hypotension: Yes Has patient had a PCN reaction causing severe rash involving mucus membranes or skin necrosis: No Has patient had a PCN reaction that required hospitalization No Has patient  had a PCN  reaction occurring within the last 10 years: No If all of the above answers are "NO", then may proceed with Cephalosporin use. Has patient had a PCN reaction causing immediate rash, facial/tongue/throat swelling, SOB or lightheadedness with hypotension: Yes Has patient had a PCN reaction causing severe rash involving mucus membranes or skin necrosis: No Has patient had a PCN reaction that required hospitalization No Has patient had a PCN reaction occurring within the last 10 years: No If all of the above answers are "NO", then may proceed with Cephalosporin use. Has patient had a PCN reaction causing immediate rash, facial/tongue/throat swelling, SOB or lightheadedness with hypotension: Yes Has patient had a PCN reaction causing sever... (TRUNCATED)   Phenazopyridine Hives   Phenazopyridine Hcl Hives   Cephalexin Hives   Flonase [Fluticasone]     "It gave me ulcers in my nose"   Milk-Related Compounds Other (See Comments)    Doesn't agree with stomach.    Phenazopyridine Hcl Hives          REVIEW OF SYSTEMS:   Review of Systems  Constitutional:  Positive for fatigue. Negative for chills and fever.  HENT:   Negative for lump/mass, mouth sores, nosebleeds, sore throat and trouble swallowing.   Eyes:  Negative for eye problems.  Respiratory:  Negative for cough and shortness of breath.   Cardiovascular:  Negative for chest pain, leg swelling and palpitations.  Gastrointestinal:  Negative for abdominal pain, constipation, diarrhea, nausea and vomiting.  Genitourinary:  Negative for bladder incontinence, difficulty urinating, dysuria, frequency, hematuria and nocturia.   Musculoskeletal:  Positive for back pain. Negative for arthralgias, flank pain, myalgias and neck pain.  Skin:  Negative for itching and rash.  Neurological:  Negative for dizziness, headaches and numbness.  Hematological:  Does not bruise/bleed easily.  Psychiatric/Behavioral:  Negative for depression, sleep  disturbance and suicidal ideas. The patient is not nervous/anxious.   All other systems reviewed and are negative.    VITALS:   Blood pressure (!) 141/75, pulse 60, temperature 97.6 F (36.4 C), temperature source Oral, resp. rate 16, weight 167 lb 6.4 oz (75.9 kg), SpO2 100 %.  Wt Readings from Last 3 Encounters:  05/30/22 167 lb 6.4 oz (75.9 kg)  05/22/22 166 lb (75.3 kg)  04/27/22 167 lb 0.6 oz (75.8 kg)    Body mass index is 33.81 kg/m.  Performance status (ECOG): 1 - Symptomatic but completely ambulatory  PHYSICAL EXAM:   Physical Exam Vitals and nursing note reviewed. Exam conducted with a chaperone present.  Constitutional:      Appearance: Normal appearance.  Cardiovascular:     Rate and Rhythm: Normal rate and regular rhythm.     Pulses: Normal pulses.     Heart sounds: Normal heart sounds.  Pulmonary:     Effort: Pulmonary effort is normal.     Breath sounds: Normal breath sounds.  Abdominal:     Palpations: Abdomen is soft. There is no hepatomegaly, splenomegaly or mass.     Tenderness: There is no abdominal tenderness.  Musculoskeletal:     Right lower leg: No edema.     Left lower leg: No edema.  Lymphadenopathy:     Cervical: No cervical adenopathy.     Right cervical: No superficial, deep or posterior cervical adenopathy.    Left cervical: No superficial, deep or posterior cervical adenopathy.     Upper Body:     Right upper body: No supraclavicular or axillary adenopathy.     Left upper body: No supraclavicular  or axillary adenopathy.  Neurological:     General: No focal deficit present.     Mental Status: She is alert and oriented to person, place, and time.  Psychiatric:        Mood and Affect: Mood normal.        Behavior: Behavior normal.     LABS:      Latest Ref Rng & Units 05/30/2022   10:09 AM 05/12/2022   10:21 AM 04/21/2022    9:27 AM  CBC  WBC 4.0 - 10.5 K/uL 6.9  7.3  7.4   Hemoglobin 12.0 - 15.0 g/dL 10.2  10.7  9.9   Hematocrit  36.0 - 46.0 % 35.6  37.5  34.7   Platelets 150 - 400 K/uL 226  299  247       Latest Ref Rng & Units 05/12/2022   10:21 AM 01/27/2022    9:36 AM 12/23/2021   11:43 AM  CMP  Glucose 70 - 99 mg/dL 163  100  143   BUN 8 - 23 mg/dL 51  22  30   Creatinine 0.44 - 1.00 mg/dL 1.56  1.10  1.35   Sodium 135 - 145 mmol/L 137  141  144   Potassium 3.5 - 5.1 mmol/L 4.6  4.2  4.4   Chloride 98 - 111 mmol/L 111  108  106   CO2 22 - 32 mmol/L 22  26  24    Calcium 8.9 - 10.3 mg/dL 9.4  9.8  8.9   Total Protein 6.5 - 8.1 g/dL 7.5  6.9    Total Bilirubin 0.3 - 1.2 mg/dL 0.4  0.5    Alkaline Phos 38 - 126 U/L 88  74    AST 15 - 41 U/L 23  23    ALT 0 - 44 U/L 22  24       No results found for: "CEA1", "CEA" / No results found for: "CEA1", "CEA" No results found for: "PSA1" No results found for: "CAN199" No results found for: "CAN125"  Lab Results  Component Value Date   TOTALPROTELP 6.2 07/31/2017   ALBUMINELP 3.5 07/31/2017   A1GS 0.2 07/31/2017   A2GS 0.7 07/31/2017   BETS 1.0 07/31/2017   GAMS 0.7 07/31/2017   MSPIKE Not Observed 07/31/2017   SPEI Comment 07/31/2017   Lab Results  Component Value Date   TIBC 378 05/12/2022   TIBC 327 01/27/2022   TIBC 332 10/12/2021   FERRITIN 158 05/12/2022   FERRITIN 185 01/27/2022   FERRITIN 397 (H) 10/12/2021   IRONPCTSAT 26 05/12/2022   IRONPCTSAT 19 01/27/2022   IRONPCTSAT 20 10/12/2021   Lab Results  Component Value Date   LDH 191 07/07/2021   LDH 223 (H) 05/04/2020   LDH 172 01/28/2020     STUDIES:   MR BRAIN W WO CONTRAST  Result Date: 05/20/2022 CLINICAL DATA:  Brain/CNS neoplasm. Assess treatment response. Previous meningioma resection. EXAM: MRI HEAD WITHOUT AND WITH CONTRAST TECHNIQUE: Multiplanar, multiecho pulse sequences of the brain and surrounding structures were obtained without and with intravenous contrast. CONTRAST:  7.4mL GADAVIST GADOBUTROL 1 MMOL/ML IV SOLN COMPARISON:  Head CT 11/09/2021.  MRI 05/25/2021.  FINDINGS: Brain: Diffusion imaging does not show any acute or subacute infarction. 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 Residual meningioma at the planum sphenoidale just to the right of midline is unchanged measuring 10 x 7 mm. No evidence  of progression. No new masses evident. No hydrocephalus. No subdural collection. Vascular: Major vessels at the base of the brain show flow. Skull and upper cervical spine: Otherwise negative Sinuses/Orbits: Clear/normal Other: None IMPRESSION: 1. Stable examination. 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 Stable residual meningioma at the planum sphenoidale just to the right of midline measuring 10 x 7 mm. No evidence of progression. 2. 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 Electronically Signed   By: Nelson Chimes M.D.   On: 05/20/2022 08:56

## 2022-05-30 ENCOUNTER — Encounter: Payer: Self-pay | Admitting: Hematology

## 2022-05-30 ENCOUNTER — Inpatient Hospital Stay (HOSPITAL_BASED_OUTPATIENT_CLINIC_OR_DEPARTMENT_OTHER): Payer: Medicare HMO | Admitting: Hematology

## 2022-05-30 ENCOUNTER — Ambulatory Visit: Payer: Medicare HMO | Admitting: Nurse Practitioner

## 2022-05-30 ENCOUNTER — Inpatient Hospital Stay: Payer: Medicare HMO

## 2022-05-30 VITALS — BP 141/75 | HR 60 | Temp 97.6°F | Resp 16 | Wt 167.4 lb

## 2022-05-30 DIAGNOSIS — E8722 Chronic metabolic acidosis: Secondary | ICD-10-CM | POA: Diagnosis not present

## 2022-05-30 DIAGNOSIS — N1832 Chronic kidney disease, stage 3b: Secondary | ICD-10-CM | POA: Diagnosis not present

## 2022-05-30 DIAGNOSIS — J449 Chronic obstructive pulmonary disease, unspecified: Secondary | ICD-10-CM | POA: Diagnosis not present

## 2022-05-30 DIAGNOSIS — D631 Anemia in chronic kidney disease: Secondary | ICD-10-CM | POA: Diagnosis not present

## 2022-05-30 DIAGNOSIS — I509 Heart failure, unspecified: Secondary | ICD-10-CM | POA: Diagnosis not present

## 2022-05-30 DIAGNOSIS — F250 Schizoaffective disorder, bipolar type: Secondary | ICD-10-CM | POA: Diagnosis not present

## 2022-05-30 DIAGNOSIS — I129 Hypertensive chronic kidney disease with stage 1 through stage 4 chronic kidney disease, or unspecified chronic kidney disease: Secondary | ICD-10-CM | POA: Diagnosis not present

## 2022-05-30 DIAGNOSIS — Z794 Long term (current) use of insulin: Secondary | ICD-10-CM | POA: Diagnosis not present

## 2022-05-30 DIAGNOSIS — G8929 Other chronic pain: Secondary | ICD-10-CM | POA: Diagnosis not present

## 2022-05-30 DIAGNOSIS — D638 Anemia in other chronic diseases classified elsewhere: Secondary | ICD-10-CM

## 2022-05-30 DIAGNOSIS — D509 Iron deficiency anemia, unspecified: Secondary | ICD-10-CM

## 2022-05-30 DIAGNOSIS — E1129 Type 2 diabetes mellitus with other diabetic kidney complication: Secondary | ICD-10-CM | POA: Diagnosis not present

## 2022-05-30 DIAGNOSIS — N189 Chronic kidney disease, unspecified: Secondary | ICD-10-CM | POA: Diagnosis not present

## 2022-05-30 DIAGNOSIS — I130 Hypertensive heart and chronic kidney disease with heart failure and stage 1 through stage 4 chronic kidney disease, or unspecified chronic kidney disease: Secondary | ICD-10-CM | POA: Diagnosis not present

## 2022-05-30 DIAGNOSIS — R809 Proteinuria, unspecified: Secondary | ICD-10-CM | POA: Diagnosis not present

## 2022-05-30 DIAGNOSIS — K589 Irritable bowel syndrome without diarrhea: Secondary | ICD-10-CM | POA: Diagnosis not present

## 2022-05-30 DIAGNOSIS — E1142 Type 2 diabetes mellitus with diabetic polyneuropathy: Secondary | ICD-10-CM | POA: Diagnosis not present

## 2022-05-30 DIAGNOSIS — I5032 Chronic diastolic (congestive) heart failure: Secondary | ICD-10-CM | POA: Diagnosis not present

## 2022-05-30 DIAGNOSIS — Z79899 Other long term (current) drug therapy: Secondary | ICD-10-CM | POA: Diagnosis not present

## 2022-05-30 DIAGNOSIS — E1122 Type 2 diabetes mellitus with diabetic chronic kidney disease: Secondary | ICD-10-CM | POA: Diagnosis not present

## 2022-05-30 DIAGNOSIS — Z8719 Personal history of other diseases of the digestive system: Secondary | ICD-10-CM | POA: Diagnosis not present

## 2022-05-30 DIAGNOSIS — K2100 Gastro-esophageal reflux disease with esophagitis, without bleeding: Secondary | ICD-10-CM | POA: Diagnosis not present

## 2022-05-30 LAB — CBC
HCT: 35.6 % — ABNORMAL LOW (ref 36.0–46.0)
Hemoglobin: 10.2 g/dL — ABNORMAL LOW (ref 12.0–15.0)
MCH: 23 pg — ABNORMAL LOW (ref 26.0–34.0)
MCHC: 28.7 g/dL — ABNORMAL LOW (ref 30.0–36.0)
MCV: 80.4 fL (ref 80.0–100.0)
Platelets: 226 10*3/uL (ref 150–400)
RBC: 4.43 MIL/uL (ref 3.87–5.11)
RDW: 15.1 % (ref 11.5–15.5)
WBC: 6.9 10*3/uL (ref 4.0–10.5)
nRBC: 0 % (ref 0.0–0.2)

## 2022-05-30 MED ORDER — EPOETIN ALFA 10000 UNIT/ML IJ SOLN
10000.0000 [IU] | Freq: Once | INTRAMUSCULAR | Status: AC
  Administered 2022-05-30: 10000 [IU] via SUBCUTANEOUS
  Filled 2022-05-30: qty 1

## 2022-05-30 NOTE — Patient Instructions (Signed)
[facility]  Discharge Instructions: Thank you for choosing [facility] to provide your oncology and hematology care.  If you have a lab appointment with the Cancer Center, please come in thru the Main Entrance and check in at the main information desk.  Wear comfortable clothing and clothing appropriate for easy access to any Portacath or PICC line.   We strive to give you quality time with your provider. You may need to reschedule your appointment if you arrive late (15 or more minutes).  Arriving late affects you and other patients whose appointments are after yours.  Also, if you miss three or more appointments without notifying the office, you may be dismissed from the clinic at the provider's discretion.      For prescription refill requests, have your pharmacy contact our office and allow 72 hours for refills to be completed.    Today you received the following Retacrit, return as scheduled.   To help prevent nausea and vomiting after your treatment, we encourage you to take your nausea medication as directed.  BELOW ARE SYMPTOMS THAT SHOULD BE REPORTED IMMEDIATELY: *FEVER GREATER THAN 100.4 F (38 C) OR HIGHER *CHILLS OR SWEATING *NAUSEA AND VOMITING THAT IS NOT CONTROLLED WITH YOUR NAUSEA MEDICATION *UNUSUAL SHORTNESS OF BREATH *UNUSUAL BRUISING OR BLEEDING *URINARY PROBLEMS (pain or burning when urinating, or frequent urination) *BOWEL PROBLEMS (unusual diarrhea, constipation, pain near the anus) TENDERNESS IN MOUTH AND THROAT WITH OR WITHOUT PRESENCE OF ULCERS (sore throat, sores in mouth, or a toothache) UNUSUAL RASH, SWELLING OR PAIN  UNUSUAL VAGINAL DISCHARGE OR ITCHING   Items with * indicate a potential emergency and should be followed up as soon as possible or go to the Emergency Department if any problems should occur.  Please show the CHEMOTHERAPY ALERT CARD or IMMUNOTHERAPY ALERT CARD at check-in to the Emergency Department and  triage nurse.  Should you have questions after your visit or need to cancel or reschedule your appointment, please contact [facility] [phone]  and follow the prompts.  Office hours are 8:00 a.m. to 4:30 p.m. Monday - Friday. Please note that voicemails left after 4:00 p.m. may not be returned until the following business day.  We are closed weekends and major holidays. You have access to a nurse at all times for urgent questions. Please call the main number to the clinic [phone] and follow the prompts.  For any non-urgent questions, you may also contact your provider using MyChart. We now offer e-Visits for anyone 18 and older to request care online for non-urgent symptoms. For details visit [email or web address].   Also download the MyChart app! Go to the app store, search "MyChart", open the app, select [place], and log in with your MyChart username and password.   

## 2022-05-30 NOTE — Progress Notes (Signed)
Patient presents today for Retacrit injection. Hemoglobin reviewed prior to administration. VSS tolerated without incident or complaint. See MAR for details. Patient stable during and after injection. Patient discharged in satisfactory condition with no s/s of distress noted.  

## 2022-05-30 NOTE — Patient Instructions (Addendum)
[place] at [facility] Discharge Instructions   You were seen and examined today by Dr. Delton Coombes.  He reviewed the results of your lab work which are normal/stable. We checked your iron levels last time which show that you may benefit from an IV iron infusion.   We will proceed with your Retacrit injection today.   Return as scheduled.    Thank you for choosing [place] at [facility] to provide your oncology and hematology care.  To afford each patient quality time with our provider, please arrive at least 15 minutes before your scheduled appointment time.   If you have a lab appointment with the [place] please come in thru the Main Entrance and check in at the main information desk.  You need to re-schedule your appointment should you arrive 10 or more minutes late.  We strive to give you quality time with our providers, and arriving late affects you and other patients whose appointments are after yours.  Also, if you no show three or more times for appointments you may be dismissed from the clinic at the providers discretion.     Again, thank you for choosing [facility].  Our hope is that these requests will decrease the amount of time that you wait before being seen by our physicians.       _____________________________________________________________  Should you have questions after your visit to [facility], please contact our office at [phone] and follow the prompts.  Our office hours are 8:00 a.m. and 4:30 p.m. Monday - Friday.  Please note that voicemails left after 4:00 p.m. may not be returned until the following business day.  We are closed weekends and major holidays.  You do have access to a nurse 24-7, just call the main number to the clinic [phone] and do not press any options, hold on the line and a nurse will answer the phone.    For prescription refill requests, have your  pharmacy contact our office and allow 72 hours.    Due to Covid, you will need to wear a mask upon entering the hospital. If you do not have a mask, a mask will be given to you at the Main Entrance upon arrival. For doctor visits, patients may have 1 support person age 45 or older with them. For treatment visits, patients can not have anyone with them due to social distancing guidelines and our immunocompromised population.

## 2022-05-31 DIAGNOSIS — E1122 Type 2 diabetes mellitus with diabetic chronic kidney disease: Secondary | ICD-10-CM | POA: Diagnosis not present

## 2022-05-31 DIAGNOSIS — I130 Hypertensive heart and chronic kidney disease with heart failure and stage 1 through stage 4 chronic kidney disease, or unspecified chronic kidney disease: Secondary | ICD-10-CM | POA: Diagnosis not present

## 2022-05-31 DIAGNOSIS — D631 Anemia in chronic kidney disease: Secondary | ICD-10-CM | POA: Diagnosis not present

## 2022-05-31 DIAGNOSIS — E1142 Type 2 diabetes mellitus with diabetic polyneuropathy: Secondary | ICD-10-CM | POA: Diagnosis not present

## 2022-05-31 DIAGNOSIS — J449 Chronic obstructive pulmonary disease, unspecified: Secondary | ICD-10-CM | POA: Diagnosis not present

## 2022-05-31 DIAGNOSIS — F250 Schizoaffective disorder, bipolar type: Secondary | ICD-10-CM | POA: Diagnosis not present

## 2022-05-31 DIAGNOSIS — Z794 Long term (current) use of insulin: Secondary | ICD-10-CM | POA: Diagnosis not present

## 2022-05-31 DIAGNOSIS — N1832 Chronic kidney disease, stage 3b: Secondary | ICD-10-CM | POA: Diagnosis not present

## 2022-05-31 DIAGNOSIS — I509 Heart failure, unspecified: Secondary | ICD-10-CM | POA: Diagnosis not present

## 2022-06-01 ENCOUNTER — Inpatient Hospital Stay (HOSPITAL_BASED_OUTPATIENT_CLINIC_OR_DEPARTMENT_OTHER): Payer: Medicare HMO | Admitting: Internal Medicine

## 2022-06-01 ENCOUNTER — Other Ambulatory Visit: Payer: Self-pay

## 2022-06-01 VITALS — BP 134/62 | HR 69 | Temp 97.6°F | Resp 16 | Ht 59.0 in | Wt 166.9 lb

## 2022-06-01 DIAGNOSIS — Z79899 Other long term (current) drug therapy: Secondary | ICD-10-CM | POA: Diagnosis not present

## 2022-06-01 DIAGNOSIS — K589 Irritable bowel syndrome without diarrhea: Secondary | ICD-10-CM | POA: Diagnosis not present

## 2022-06-01 DIAGNOSIS — G40909 Epilepsy, unspecified, not intractable, without status epilepticus: Secondary | ICD-10-CM

## 2022-06-01 DIAGNOSIS — K2100 Gastro-esophageal reflux disease with esophagitis, without bleeding: Secondary | ICD-10-CM | POA: Diagnosis not present

## 2022-06-01 DIAGNOSIS — N1832 Chronic kidney disease, stage 3b: Secondary | ICD-10-CM | POA: Diagnosis not present

## 2022-06-01 DIAGNOSIS — J449 Chronic obstructive pulmonary disease, unspecified: Secondary | ICD-10-CM | POA: Diagnosis not present

## 2022-06-01 DIAGNOSIS — Z8719 Personal history of other diseases of the digestive system: Secondary | ICD-10-CM | POA: Diagnosis not present

## 2022-06-01 DIAGNOSIS — D329 Benign neoplasm of meninges, unspecified: Secondary | ICD-10-CM | POA: Diagnosis not present

## 2022-06-01 DIAGNOSIS — D631 Anemia in chronic kidney disease: Secondary | ICD-10-CM | POA: Diagnosis not present

## 2022-06-01 DIAGNOSIS — I129 Hypertensive chronic kidney disease with stage 1 through stage 4 chronic kidney disease, or unspecified chronic kidney disease: Secondary | ICD-10-CM | POA: Diagnosis not present

## 2022-06-01 DIAGNOSIS — G8929 Other chronic pain: Secondary | ICD-10-CM | POA: Diagnosis not present

## 2022-06-01 NOTE — Progress Notes (Signed)
[facility] [place], [place] [address] [phone]   Interval Evaluation  Date of Service: 06/01/22 Patient Name: [name] Patient MRN: [id number] Patient DOB: 1950/07/12 Provider: Ventura Sellers, MD  Identifying Statement:  [name] is a 72 y.o. female with right frontal meningioma   Oncologic History: 11/23/11: Craniotomy, resection orbital groove meningioma Sherwood Gambler) 10/17/18: SRS for localized recurrent disease Lisbeth Renshaw)  Interval History: [name] presents today for follow up after recent MRI brain. No new or progressive changes since prior visit.  Has an aide to help during the week.  H+P (04/27/20) Patient presents today for follow up after recent MRI brain.  She describes no new or progressive neurologic deficits.  No focal weakness, language dysfunction or recent seizures.  She continues to dose Lamictal through Dr. Jannifer Franklin.  She continues to describe chronic limb and joint pain requiring frequent use of opiate rescue, managed by pain clinic.  Multiple recent ED visits.  Continues to follow with behavioral health, and works with physical therapy as well.  Ambulates with a walker because of orthopedic dysfunction, chronic pain.   Medications: Current Outpatient Medications on File Prior to Visit  Medication Sig Dispense Refill   ACCU-CHEK GUIDE test strip USE TO CHECK BLOOD SUGAR FOUR TIMES A DAY AND AS NEEDED (NEED MD APPOINTMENT FOR ANY FURTHER REFILLS) 450 strip 3   Accu-Chek Softclix Lancets lancets TEST BLOOD SUGAR THREE TIMES DAILY AS DIRECTED 300 each 2   acetaminophen (TYLENOL) 325 MG tablet Take 650 mg by mouth every 6 (six) hours as needed.     albuterol (PROVENTIL) (2.5 MG/3ML) 0.083% nebulizer solution Take 3 mLs (2.5 mg total) by nebulization every 6 (six) hours as needed for wheezing or shortness of breath. 75 mL 12   albuterol (VENTOLIN HFA) 108 (90 Base) MCG/ACT inhaler INHALE 1 TO 2 PUFFS EVERY 6  HOURS AS NEEDED FOR WHEEZING, SHORTNESS OF BREATH (Patient taking differently: Inhale 1-2 puffs into the lungs every 6 (six) hours as needed for wheezing or shortness of breath.) 3 each 3   Alcohol Swabs (DROPSAFE ALCOHOL PREP) 70 % PADS USE TO CLEAN FINGER PRIOR TO TESTING FOR BLOOD SUGAR AS DIRECTED 300 each 3   alendronate (FOSAMAX) 70 MG tablet TAKE 1 TABLET EVERY 7 DAYS ON AN EMPTY STOMACH WITH A FULL GLASS OF WATER (Patient taking differently: Take 70 mg by mouth once a week.) 12 tablet 3   amLODipine (NORVASC) 10 MG tablet TAKE 1 TABLET EVERY DAY 60 tablet 0   ascorbic acid (VITAMIN C) 500 MG tablet Take 500 mg by mouth daily.     aspirin EC 81 MG tablet Take 1 tablet (81 mg total) by mouth daily with breakfast. 120 tablet 2   benzonatate (TESSALON) 200 MG capsule Take 1 capsule (200 mg total) by mouth 3 (three) times daily as needed for cough. 40 capsule 0   Blood Glucose Calibration (ACCU-CHEK GUIDE CONTROL) LIQD USE AS DIRECTED 1 each 0   blood glucose meter kit and supplies Dispense based on patient and insurance preference. Use up to four times daily as directed. (FOR ICD-10 E10.9, E11.9). 1 each 0   buPROPion (WELLBUTRIN XL) 150 MG 24 hr tablet Take 1 tablet (150 mg total) by mouth every morning. 90 tablet 2   Calcium Carb-Cholecalciferol (CALTRATE 600+D3 SOFT) 600-20 MG-MCG CHEW Chew 1 tablet by mouth daily at 12 noon.     cetirizine (ZYRTEC) 10 MG tablet Take  1 tablet (10 mg total) by mouth daily. 30 tablet 1   Cholecalciferol (D3 PO) Take 1 tablet by mouth daily.     Continuous Blood Gluc Sensor (FREESTYLE LIBRE 14 DAY SENSOR) MISC 1 each by Does not apply route every 14 (fourteen) days. Change every 2 weeks 2 each 11   diazepam (VALIUM) 5 MG tablet Take 1 tablet (5 mg total) by mouth once as needed (MRI claustrophobia). 6 tablet 0   Docusate Sodium (DSS) 100 MG CAPS Take 1 tablet by mouth in the morning and at bedtime.     DROPLET PEN NEEDLES 31G X 8 MM MISC USE FOR INJECTING INSULIN  4 TIMES DAILY. 400 each 0   Elastic Bandages & Supports (ADJUSTABLE ARM SLING) MISC L arm sling 1 each 0   ezetimibe (ZETIA) 10 MG tablet TAKE 1 TABLET EVERY DAY 90 tablet 10   insulin aspart (NOVOLOG FLEXPEN) 100 UNIT/ML FlexPen Inject 5 Units into the skin 3 (three) times daily with meals. Give if eats 50% or more of meal. (Patient taking differently: Inject 14 Units into the skin 3 (three) times daily with meals. Give if eats 50% or more of meal.) 60 mL 0   ipratropium (ATROVENT) 0.03 % nasal spray Place 2 sprays into both nostrils every 12 (twelve) hours. 30 mL 12   lactobacillus acidophilus (BACID) TABS tablet Take 2 tablets by mouth 3 (three) times daily.     lamoTRIgine (LAMICTAL) 100 MG tablet Take 1 tablet (100 mg total) by mouth 2 (two) times daily. 180 tablet 2   levothyroxine (SYNTHROID) 50 MCG tablet TAKE 1 TABLET DAILY SIX DAYS A WEEK AND 1/2 TABLET ON ONE DAY A WEEK 85 tablet 2   LORazepam (ATIVAN) 0.5 MG tablet TAKE 1 TABLET TWICE DAILY 60 tablet 2   methocarbamol (ROBAXIN-750) 750 MG tablet Take 1 tablet (750 mg total) by mouth every 8 (eight) hours as needed for muscle spasms. 21 tablet 0   metoprolol tartrate (LOPRESSOR) 50 MG tablet TAKE 1 TABLET TWICE DAILY (NEED MD APPOINTMENT) (Patient taking differently: Take 50 mg by mouth 2 (two) times daily.) 180 tablet 3   Misc. Devices (MATTRESS PAD) MISC FIRM MATTRESS PAD for hospital bed x 1 1 each 0   montelukast (SINGULAIR) 10 MG tablet TAKE 1 TABLET EVERY DAY 90 tablet 3   Multiple Vitamins-Minerals (MULTIVITAMIN GUMMIES ADULT PO) Take 1 tablet by mouth daily.     MYRBETRIQ 25 MG TB24 tablet TAKE 1 TABLET EVERY DAY 90 tablet 3   Omega-3 Fatty Acids (FISH OIL PO) Take 1 capsule by mouth daily.     oxyCODONE-acetaminophen (PERCOCET) 10-325 MG tablet Take 1 tablet by mouth every 8 (eight) hours as needed for pain.     potassium chloride (KLOR-CON) 10 MEQ tablet Take 1 tablet (10 mEq total) by mouth 2 (two) times daily. 30 tablet 2    pregabalin (LYRICA) 75 MG capsule Take 1 capsule (75 mg total) by mouth daily.     RABEprazole (ACIPHEX) 20 MG tablet TAKE 1 TABLET TWICE DAILY 180 tablet 2   RESTASIS 0.05 % ophthalmic emulsion Place 2 drops into both eyes daily.     risperiDONE (RISPERDAL) 0.5 MG tablet Take 1 tablet (0.5 mg total) by mouth at bedtime. 90 tablet 2   rosuvastatin (CRESTOR) 5 MG tablet TAKE 1 TABLET AT BEDTIME. 90 tablet 1   sertraline (ZOLOFT) 100 MG tablet Take 1 tablet (100 mg total) by mouth every morning. 90 tablet 2   SPIKEVAX injection  spironolactone (ALDACTONE) 50 MG tablet TAKE 1 TABLET (50 MG TOTAL) BY MOUTH DAILY. DISCONTINUE SPIRONOLACTONE 25MG  90 tablet 3   STIOLTO RESPIMAT 2.5-2.5 MCG/ACT AERS INHALE 2 PUFFS INTO THE LUNGS DAILY. 12 g 1   telmisartan (MICARDIS) 20 MG tablet Take 10 mg by mouth daily.     TOUJEO MAX SOLOSTAR 300 UNIT/ML Solostar Pen INJECT 20-26 UNITS INTO THE SKIN DAILY. 12 mL 3   traZODone (DESYREL) 150 MG tablet Take 1 tablet (150 mg total) by mouth at bedtime. 90 tablet 2   vitamin E 180 MG (400 UNITS) capsule Take 400 Units by mouth daily.     No current facility-administered medications on file prior to visit.    Allergies:  Allergies  Allergen Reactions   Iron Nausea And Vomiting    And itching And itching   Milk ([place]) Rash    Doesn't agree with stomach.  Doesn't agree with stomach.  Doesn't agree with stomach.    Penicillins Hives    Has patient had a PCN reaction causing immediate rash, facial/tongue/throat swelling, SOB or lightheadedness with hypotension: Yes Has patient had a PCN reaction causing severe rash involving mucus membranes or skin necrosis: No Has patient had a PCN reaction that required hospitalization No Has patient had a PCN reaction occurring within the last 10 years: No If all of the above answers are "NO", then may proceed with Cephalosporin use.  Other reaction(s): Other (see comments) Has patient had a PCN reaction causing immediate  rash, facial/tongue/throat swelling, SOB or lightheadedness with hypotension: Yes Has patient had a PCN reaction causing severe rash involving mucus membranes or skin necrosis: No Has patient had a PCN reaction that required hospitalization No Has patient had a PCN reaction occurring within the last 10 years: No If all of the above answers are "NO", then may proceed with Cephalosporin use. Has patient had a PCN reaction causing immediate rash, facial/tongue/throat swelling, SOB or lightheadedness with hypotension: Yes Has patient had a PCN reaction causing severe rash involving mucus membranes or skin necrosis: No Has patient had a PCN reaction that required hospitalization No Has patient had a PCN reaction occurring within the last 10 years: No If all of the above answers are "NO", then may proceed with Cephalosporin use. Has patient had a PCN reaction causing immediate rash, facial/tongue/throat swelling, SOB or lightheadedness with hypotension: Yes Has patient had a PCN reaction causing severe rash involving mucus membranes or skin necrosis: No Has patient had a PCN reaction that required hospitalization No Has patient had a PCN reaction occurring within the last 10 years: No If all of the above answers are "NO", then may proceed with Cephalosporin use. Has patient had a PCN reaction causing immediate rash, facial/tongue/throat swelling, SOB or lightheadedness with hypotension: Yes Has patient had a PCN reaction causing sever... (TRUNCATED)   Phenazopyridine Hives   Phenazopyridine Hcl Hives   Cephalexin Hives   Flonase [Fluticasone]     "It gave me ulcers in my nose"   Milk-Related Compounds Other (See Comments)    Doesn't agree with stomach.    Phenazopyridine Hcl Hives         Past Medical History:  Past Medical History:  Diagnosis Date   Allergy    Anemia    Anemia in chronic kidney disease (CKD) 08/10/2021   Anemia in chronic kidney disease (CKD) 10/12/2021   Anxiety    takes  Ativan daily   Arthritis    Assistance needed for mobility    Bipolar  disorder ([facility])    takes Risperdal nightly   Blood transfusion    Brain tumor ([facility])    Cancer ([place])    In her gum   Carpal tunnel syndrome of right wrist 05/23/2011   Cervical disc disorder with radiculopathy of cervical region 10/31/2012   Chronic back pain    Chronic idiopathic constipation    Chronic neck and back pain    Colon polyps    COPD (chronic obstructive pulmonary disease) with chronic bronchitis 09/16/2013   Office Spirometry 10/30/2013-submaximal effort based on appearance of loop and curve. Numbers would fit with severe restriction but her physiologic capability may be better than this. FVC 0.91/44%, and 10.74/45%, FEV1/FVC 0.81, FEF 25-75% 1.43/69%     Diabetes mellitus    Type II   Diverticulosis    TCS 9/08 by Dr. Delfin Edis for diarrhea . Bx for micro scopic colitis negative.    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   Metabolic encephalopathy [id number]   Migraines    chronic headaches   Mononeuritis lower limb    Narcolepsy    Osteoporosis    Pancreatitis 2006   due to Depakote with normal EUS    Paralysis (HCC)    Schatzki's ring    non critical / EGD with ED 8/2011with RMR   Seizures ([place])    takes Lamictal daily.Last seizure 3 yrs ago   Sleep apnea    on CPAP   Small bowel obstruction (H[facility])    Stroke ([place])    left sided weakness, speech changes   Tubular adenoma of colon    Past Surgical History:  Past Surgical  History:  Procedure Laterality Date   ABDOMINAL HYSTERECTOMY  1978   BACK SURGERY  July 2012   BACTERIAL OVERGROWTH TEST N/A 05/05/2013   Procedure: BACTERIAL OVERGROWTH TEST;  Surgeon: Daneil Dolin, MD;  [facility];  Service: Endoscopy;  Laterality: N/A;  7:30   BIOPSY THYROID  2009   BRAIN SURGERY  11/2011   resection of meningioma   BREAST REDUCTION SURGERY  1994   CARDIAC CATHETERIZATION  05/10/2005   normal coronaries, normal LV systolic function and EF (Dr. Jackie Plum)   [place] Left 07/22/04   Dr. Aline Brochure   CATARACT EXTRACTION Bilateral    CHOLECYSTECTOMY  1984   COLONOSCOPY N/A 09/25/2012   [id number] diverticulosis.  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 Hiatal hernia   GIVENS CAPSULE STUDY N/A 01/15/2013   NORMAL.    IR GENERIC  HISTORICAL  03/17/2016   IR RADIOLOGIST EVAL & MGMT 03/17/2016 [facility]   LESION REMOVAL N/A 05/31/2015   Procedure: REMOVAL RIGHT AND LEFT LESIONS OF MANDIBLE;  Surgeon: Diona Browner, DDS;  Location: [facility];  Service: Oral Surgery;  Laterality: N/A;   MALONEY DILATION  12/29/2010   RMR;   NM MYOCAR PERF WALL MOTION  2006   "relavtiely normal" persantine, mild anterior thinning (breast attenuation artifact), no region of scar/ischemia   OVARIAN CYST REMOVAL     RECTOCELE REPAIR N/A 06/29/2015   Procedure: POSTERIOR REPAIR (RECTOCELE);  Surgeon: Jonnie Kind, MD;  Location: [facility];  Service: Gynecology;  Laterality: N/A;   REDUCTION MAMMAPLASTY Bilateral     SPINE SURGERY  09/29/2010   Dr. Rolena Infante   surgical excision of 3 tumors from right thigh and right buttock  and left upper thigh  2010   TOOTH EXTRACTION Bilateral 12/14/2014   Procedure: REMOVAL OF BILATERAL MANDIBULAR EXOSTOSES;  Surgeon: Diona Browner, DDS;  Location: [facility];  Service: Oral Surgery;  Laterality: Bilateral;   TRANSTHORACIC ECHOCARDIOGRAM  2010   EF 60-65%, mild conc LVH, grade 1 diastolic dysfunction; mildly calcified MV annulus with mildly thickened leaflets, mildly calcified MR annulus   Social History:  Social History   Socioeconomic History   Marital status: Divorced    Spouse name: Not on file   Number of children: 1   Years of education: 12   Highest education level: High school graduate  Occupational History   Occupation: Disabled  Tobacco Use   Smoking status: Former    Packs/day: 0.25    Years: 30.00    Additional pack years: 0.00    Total pack years: 7.50    Types: Cigarettes    Quit date: 07/05/2021    Years since quitting: 0.9    Passive exposure: Past   Smokeless tobacco: Never   Tobacco comments:    3-4 a day MRC 05/17/2021  Vaping Use   Vaping Use: Never used  Substance and Sexual Activity   Alcohol use: No    Alcohol/week: 0.0 standard drinks of alcohol    Comment:     Drug use: No   Sexual activity: Not Currently  Other Topics Concern   Not on file  Social History Narrative   01/29/18 Lives alone, has 3 aides, Mon- Fri 8 hrs, 2 hrs on Sat-Sun, RN manages her meds   Caffeine use: Drink coffee sometimes    Right handed    Social Determinants of Health   Financial Resource Strain: Low Risk  (01/03/2022)   Overall Financial Resource Strain (CARDIA)    Difficulty of Paying Living Expenses: Not very hard  Food Insecurity: No Food Insecurity (01/03/2022)   Hunger Vital Sign    Worried About Running Out of Food in the Last Year: Never true    [place] in the Last Year: Never true  Transportation Needs: No Transportation Needs  (01/03/2022)   PRAPARE - Hydrologist (Medical): No    Lack of Transportation (Non-Medical): No  Physical Activity: Insufficiently Active (01/03/2022)   Exercise Vital Sign    Days of Exercise per Week: 2 days    Minutes of Exercise per Session: 20 min  Stress: No Stress Concern Present (01/03/2022)   [place]    Feeling of Stress : Only a little  Social Connections: Moderately Integrated (01/03/2022)   Social Connection and Isolation Panel [NHANES]  Frequency of Communication with Friends and Family: Three times a week    Frequency of Social Gatherings with Friends and Family: Once a week    Attends Religious Services: More than 4 times per year    Active Member of [employer] or Organizations: Yes    Attends Music therapist: More than 4 times per year    Marital Status: Divorced  Intimate Partner Violence: Not At Risk (05/21/2019)   Humiliation, Afraid, Rape, and Kick questionnaire    Fear of Current or Ex-Partner: No    Emotionally Abused: No    Physically Abused: No    Sexually Abused: No   Family History:  Family History  Problem Relation Age of Onset   Heart attack Mother        HTN   Pneumonia Father    Kidney failure Father    Diabetes Father    Pancreatic cancer Sister    Cancer Sister        breast    Cancer Sister        pancreatic   Diabetes Brother    Hypertension Brother    Diabetes Brother    Alcohol abuse Maternal Uncle    Stroke Maternal Grandmother    Heart attack Maternal Grandfather    Hypertension Son    Sleep apnea Son    Colon cancer Neg Hx    Anesthesia problems Neg Hx    Hypotension Neg Hx    Malignant hyperthermia Neg Hx    Pseudochol deficiency Neg Hx    Breast cancer Neg Hx    Stomach cancer Neg Hx     Review of Systems: Constitutional: Doesn't report fevers, chills or abnormal weight loss Eyes: Doesn't report blurriness of  vision Ears, nose, mouth, throat, and face: Doesn't report sore throat Respiratory: Doesn't report cough, dyspnea or wheezes Cardiovascular: Doesn't report palpitation, chest discomfort  Gastrointestinal:  Doesn't report nausea, constipation, diarrhea GU: Doesn't report incontinence Skin: Doesn't report skin rashes Neurological: Per HPI Musculoskeletal: chronic leg pain Behavioral/Psych: ++anxiety, depression  Physical Exam: Vitals:   06/01/22 1056  BP: 134/62  Pulse: 69  Resp: 16  Temp: 97.6 F (36.4 C)  SpO2: 100%    KPS: 70. General: Alert, cooperative, pleasant, in no acute distress Head: Normal EENT: No conjunctival injection or scleral icterus.  Lungs: Resp effort normal Cardiac: Regular rate Abdomen: Non-distended abdomen Skin: No rashes cyanosis or petechiae. Extremities: Limited ROM  Neurologic Exam: Mental Status: Awake, alert, attentive to examiner. Oriented to self and environment. Language is fluent with intact comprehension.  Cranial Nerves: Visual acuity is grossly normal. Visual fields are full. Extra-ocular movements intact. No ptosis. Face is symmetric Motor: Tone and bulk are normal. Power is full in both arms and legs, motor exam limited by pain and ROM issues Sensory: Grossly intact Gait: Assisted secondary to orthopedic limitations   Labs: I have reviewed the data as listed    Component Value Date/Time   NA 137 05/12/2022 1021   NA 144 12/23/2021 1143   K 4.6 05/12/2022 1021   CL 111 05/12/2022 1021   CO2 22 05/12/2022 1021   GLUCOSE 163 (H) 05/12/2022 1021   BUN 51 (H) 05/12/2022 1021   BUN 30 (H) 12/23/2021 1143   CREATININE 1.56 (H) 05/12/2022 1021   CREATININE 1.24 (H) 09/30/2018 1143   CALCIUM 9.4 05/12/2022 1021   PROT 7.5 05/12/2022 1021   PROT 6.6 11/20/2021 1118   ALBUMIN 3.9 05/12/2022 1021   ALBUMIN 4.2 11/20/2021 1118  AST 23 05/12/2022 1021   ALT 22 05/12/2022 1021   ALKPHOS 88 05/12/2022 1021   BILITOT 0.4 05/12/2022  1021   BILITOT <0.2 11/20/2021 1118   GFRNONAA 35 (L) 05/12/2022 1021   GFRNONAA 45 (L) 09/30/2018 1143   GFRAA 72 12/11/2019 1224   GFRAA 52 (L) 09/30/2018 1143   Lab Results  Component Value Date   WBC 6.9 05/30/2022   NEUTROABS 4.9 05/12/2022   HGB 10.2 (L) 05/30/2022   HCT 35.6 (L) 05/30/2022   MCV 80.4 05/30/2022   PLT 226 05/30/2022    Imaging: [facility] Clinician Interpretation: I have personally reviewed the CNS images as listed.  My interpretation, in the context of the patient's clinical presentation, is stable disease  MR BRAIN W WO CONTRAST  Result Date: 05/20/2022 CLINICAL DATA:  Brain/CNS neoplasm. Assess treatment response. Previous meningioma resection. EXAM: MRI HEAD WITHOUT AND WITH CONTRAST TECHNIQUE: Multiplanar, multiecho pulse sequences of the brain and surrounding structures were obtained without and with intravenous contrast. CONTRAST:  7.41mL GADAVIST GADOBUTROL 1 MMOL/ML IV SOLN COMPARISON:  Head CT 11/09/2021.  MRI 05/25/2021. FINDINGS: Brain: Diffusion imaging does not show any acute or subacute infarction. 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 Residual meningioma at the planum sphenoidale just to the right of midline is unchanged measuring 10 x 7 mm. No evidence of progression. No new masses evident. No hydrocephalus. No subdural collection. Vascular: Major vessels at the base of the brain show flow. Skull and upper cervical spine: Otherwise negative Sinuses/Orbits: Clear/normal Other: None IMPRESSION: 1. Stable examination. 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 Stable residual meningioma at the planum sphenoidale just to the right of midline measuring 10 x 7 mm. No evidence of progression. 2. 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 Electronically Signed   By: Nelson Chimes  M.D.   On: 05/20/2022 08:56      Assessment/Plan Meningioma [facility]) [D32.9]  [name] is clinically and radiographically stable from neuro-onc standpoint today.  MRI demonstrates stable findings.  We ask that [name] return to clinic in 12 months following next brain MRI, or sooner as needed.  All questions were answered. The patient knows to call the clinic with any problems, questions or concerns. No barriers to learning were detected.  The total time spent in the encounter was 30 minutes and more than 50% was on counseling and review of test results   Ventura Sellers, MD Medical Director of Neuro-Oncology [facility] at [place] 06/01/22 11:07 AM

## 2022-06-02 ENCOUNTER — Ambulatory Visit (HOSPITAL_COMMUNITY)
Admission: RE | Admit: 2022-06-02 | Discharge: 2022-06-02 | Disposition: A | Discharge status: Home / Self Care | Payer: Medicare HMO | Source: Ambulatory Visit | Attending: Internal Medicine | Admitting: Internal Medicine

## 2022-06-02 DIAGNOSIS — M25512 Pain in left shoulder: Secondary | ICD-10-CM | POA: Diagnosis not present

## 2022-06-02 DIAGNOSIS — E1142 Type 2 diabetes mellitus with diabetic polyneuropathy: Secondary | ICD-10-CM | POA: Diagnosis not present

## 2022-06-02 DIAGNOSIS — G8911 Acute pain due to trauma: Secondary | ICD-10-CM | POA: Insufficient documentation

## 2022-06-02 DIAGNOSIS — Z794 Long term (current) use of insulin: Secondary | ICD-10-CM | POA: Diagnosis not present

## 2022-06-02 DIAGNOSIS — F250 Schizoaffective disorder, bipolar type: Secondary | ICD-10-CM | POA: Diagnosis not present

## 2022-06-02 DIAGNOSIS — D631 Anemia in chronic kidney disease: Secondary | ICD-10-CM | POA: Diagnosis not present

## 2022-06-02 DIAGNOSIS — I130 Hypertensive heart and chronic kidney disease with heart failure and stage 1 through stage 4 chronic kidney disease, or unspecified chronic kidney disease: Secondary | ICD-10-CM | POA: Diagnosis not present

## 2022-06-02 DIAGNOSIS — I509 Heart failure, unspecified: Secondary | ICD-10-CM | POA: Diagnosis not present

## 2022-06-02 DIAGNOSIS — N1832 Chronic kidney disease, stage 3b: Secondary | ICD-10-CM | POA: Diagnosis not present

## 2022-06-02 DIAGNOSIS — E1122 Type 2 diabetes mellitus with diabetic chronic kidney disease: Secondary | ICD-10-CM | POA: Diagnosis not present

## 2022-06-02 DIAGNOSIS — M19012 Primary osteoarthritis, left shoulder: Secondary | ICD-10-CM | POA: Diagnosis not present

## 2022-06-02 DIAGNOSIS — J449 Chronic obstructive pulmonary disease, unspecified: Secondary | ICD-10-CM | POA: Diagnosis not present

## 2022-06-05 ENCOUNTER — Telehealth: Payer: Self-pay | Admitting: Internal Medicine

## 2022-06-05 NOTE — Telephone Encounter (Signed)
Patient calling about mri results.

## 2022-06-06 ENCOUNTER — Ambulatory Visit (HOSPITAL_COMMUNITY)
Admission: RE | Admit: 2022-06-06 | Discharge: 2022-06-06 | Disposition: A | Discharge status: Home / Self Care | Payer: Medicare HMO | Source: Ambulatory Visit | Attending: Nurse Practitioner | Admitting: Nurse Practitioner

## 2022-06-06 ENCOUNTER — Other Ambulatory Visit: Payer: Self-pay | Admitting: Internal Medicine

## 2022-06-06 ENCOUNTER — Telehealth: Payer: Self-pay | Admitting: Nurse Practitioner

## 2022-06-06 ENCOUNTER — Ambulatory Visit
Admission: EM | Admit: 2022-06-06 | Discharge: 2022-06-06 | Disposition: A | Discharge status: Home / Self Care | Payer: Medicare HMO | Attending: Nurse Practitioner | Admitting: Nurse Practitioner

## 2022-06-06 DIAGNOSIS — M79662 Pain in left lower leg: Secondary | ICD-10-CM | POA: Insufficient documentation

## 2022-06-06 DIAGNOSIS — S46812A Strain of other muscles, fascia and tendons at shoulder and upper arm level, left arm, initial encounter: Secondary | ICD-10-CM

## 2022-06-06 DIAGNOSIS — M79605 Pain in left leg: Secondary | ICD-10-CM | POA: Diagnosis not present

## 2022-06-06 NOTE — ED Triage Notes (Signed)
Pt reports she has left side calf pain x 5 days. The calf pain is making it difficult to walk and she is unsteady on her feet. Pt took an oxycodone for relief.   Pt denies injury to left calf

## 2022-06-06 NOTE — ED Notes (Signed)
Called central scheduling and reported pt is on schedule at [facility] outpatient radiology. Pt to report there post discharge from [facility]. Provider and pt aware.

## 2022-06-06 NOTE — ED Provider Notes (Signed)
[facility]    CSN: [id number] Arrival date & time: 06/06/22  1127      History   Chief Complaint No chief complaint on file.   HPI [name] is a 72 y.o. female.   The history is provided by the patient and a caregiver.   The patient presents with a 5-day history of left calf pain.  Patient denies any injury or trauma, redness, swelling of the left calf..  She states that the pain has become worse, and worsens when she ambulates.  Patient's caregiver states patient is not very active, reports that she spends most of her time in bed daily.  Patient reports that she feels like there is a "knot" in her leg.  Patient with a history of CVA, with left-sided residual deficits.  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  Patient also has a history of kidney disease, COPD, and cancer.  Patient denies any recent travel, shortness of breath, difficulty breathing, or chest pain.  Patient states that she currently is smoking, but she is "trying to quit".  She has taken oxycodone for her leg pain with minimal relief.  Past Medical History:  Diagnosis Date   Allergy    Anemia    Anemia in chronic kidney disease (CKD) 08/10/2021   Anemia in chronic kidney disease (CKD) 10/12/2021   Anxiety    takes Ativan daily   Arthritis    Assistance needed for mobility    Bipolar disorder ([place])    takes Risperdal nightly   Blood transfusion    Brain tumor ([place])    Cancer ([place])    In her gum   Carpal tunnel syndrome of right wrist 05/23/2011   Cervical disc disorder with radiculopathy of cervical region 10/31/2012   Chronic back pain    Chronic idiopathic constipation    Chronic neck and back pain    Colon polyps    COPD (chronic obstructive pulmonary disease) with chronic bronchitis 09/16/2013   Office Spirometry 10/30/2013-submaximal effort based on appearance of loop and curve. Numbers would fit with severe restriction but her  physiologic capability may be better than this. FVC 0.91/44%, and 10.74/45%, FEV1/FVC 0.81, FEF 25-75% 1.43/69%     Diabetes mellitus    Type II   Diverticulosis    TCS 9/08 by Dr. Delfin Edis for diarrhea . Bx for micro scopic colitis negative.    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   Metabolic encephalopathy [id number]   Migraines    chronic headaches   Mononeuritis lower limb    Narcolepsy    Osteoporosis    Pancreatitis 2006   due to Depakote with normal EUS    Paralysis (HCC)    Schatzki's ring    non critical / EGD with ED 8/2011with RMR   Seizures ([place])    takes Lamictal daily.Last seizure 3 yrs ago   Sleep  apnea    on CPAP   Small bowel obstruction (HCC)    Stroke ([place])    left sided weakness, speech changes   Tubular adenoma of colon     Patient Active Problem List   Diagnosis Date Noted   Acute pain of left shoulder due to trauma 04/30/2022   Left-sided chest pain 04/27/2022   Sore throat 03/12/2022   Maxillary sinusitis 03/12/2022   Encounter for Medicare annual examination with abnormal findings 03/09/2022   Acute bronchitis 03/09/2022   Encounter for medication review and counseling 01/31/2022   Rhinitis 01/16/2022   Bilateral leg edema 12/21/2021   Hypoglycemia 12/20/2021   Skin lesions, generalized 12/19/2021   Insomnia due to stress 12/19/2021   Major depressive disorder 12/11/2021   Anemia in chronic kidney disease (CKD) 10/12/2021   Subconjunctival hemorrhage of left eye 10/02/2021    Dehydration 09/25/2021   Encounter for support and coordination of transition of care 09/25/2021   Acute renal failure superimposed on stage 3a chronic kidney disease ([place]) 09/23/2021   AKI (acute kidney injury) ([place]) 09/16/2021   CHF (congestive heart failure) ([place]) 09/16/2021   Abnormal EKG 09/16/2021   Urinary frequency 09/01/2021   Anemia of chronic disease 08/10/2021   Generalized pain 08/04/2021   Sciatica, right side 07/19/2021   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[place]) 07/10/2021   Atherosclerosis of aorta ([place]) 07/10/2021   Generalized weakness 05/12/2021   Advanced care planning/counseling discussion 12/23/2020   Head trauma, initial encounter 12/20/2020   Trigger finger, right 12/12/2020   Confusion 11/24/2020   Blurry vision 11/18/2020   Cervical radiculopathy 07/09/2020   Closed fracture of lateral malleolus of right fibula 02/27/2020   Headache 02/19/2020   Tubular adenoma of colon 02/09/2019   Benign neoplasm of cerebral meninges ([place]) 11/12/2018   Benign meningioma of brain ([place]) 10/31/2018   Osteoarthritis of both hips 07/06/2018   Lipoma of extremity 12/31/2017   Impingement syndrome of left shoulder region 12/27/2017   Weakness generalized    Leg weakness, bilateral 10/28/2017   Lumbar spondylosis with myelopathy 10/12/2017   Numbness of hand 10/10/2017   Chronic neck pain 07/25/2017   Chronic pain syndrome 07/25/2017   At risk for cardiovascular event [id number]   Acute metabolic encephalopathy [id number]   Diabetic polyneuropathy associated with type 2 diabetes mellitus ([place]) 05/19/2016   Tobacco user 04/24/2016   Obesity (BMI 30.0-34.9) 04/24/2016   History of palpitations 08/09/2015   Labile hypertension 08/03/2015   Normal coronary arteries 08/03/2015   Dizziness 07/15/2015   Left-sided low back pain with left-sided sciatica 06/27/2015   Multinodular goiter 05/06/2015   Rectocele, female  04/27/2015   Anal sphincter incontinence 04/27/2015   Pelvic relaxation due to rectocele 03/30/2015   Hyperkalemia 02/22/2015   Pulmonary hypertension ([place]) 02/22/2015   Posterior chest pain 02/21/2015   Episodic cigarette smoking dependence 01/11/2015   Migraine without aura and without status migrainosus, not intractable 07/02/2014   Flatulence 02/18/2014   Microcytic anemia 02/18/2014   Hip pain, right 12/17/2013   Acquired hypothyroidism 08/16/2013   Gastroparesis 04/28/2013   Nicotine dependence 03/09/2013   Seizure disorder ([place]) 01/19/2013   Displacement of cervical intervertebral disc without myelopathy 12/13/2012   Bursitis of shoulder 12/13/2012   Cervical disc disorder with radiculopathy of cervical region 10/31/2012   Solitary pulmonary nodule 08/19/2012   Anemia 07/05/2012   Hypersomnia disorder related to a known organic factor 06/11/2012   Pruritus 04/18/2012   Meningioma ([place]) 11/19/2011  Mononeuritis leg 10/25/2011   Carpal tunnel syndrome of right wrist 05/23/2011   Chronic pain of right hand 05/04/2011   Polypharmacy 04/28/2011   Mood disorder ([place]) 04/28/2011   Constipation 04/13/2011   Recurrent falls 12/12/2010   Oropharyngeal dysphagia 07/12/2010   Urinary incontinence 12/16/2009   HEARING LOSS 10/26/2009   DMII (diabetes mellitus, type 2) ([place]) 07/07/2009   Mixed hyperlipidemia 12/11/2008   IBS 12/11/2008   GERD 07/29/2008   MILK PRODUCTS ALLERGY 07/29/2008   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/03/2007   Essential hypertension 06/27/2007   Backache 06/19/2007   Osteoporosis 06/19/2007   Obstructive sleep apnea 06/19/2007   TRIGGER FINGER 04/18/2007   DIVERTICULOSIS, COLON 11/13/2006    Past Surgical History:  Procedure Laterality Date   ABDOMINAL HYSTERECTOMY  1978   BACK SURGERY  July 2012   BACTERIAL OVERGROWTH TEST N/A 05/05/2013   Procedure: BACTERIAL OVERGROWTH TEST;  Surgeon: Daneil Dolin, MD;   [facility];  Service: Endoscopy;  Laterality: N/A;  7:30   BIOPSY THYROID  2009   BRAIN SURGERY  11/2011   resection of meningioma   BREAST REDUCTION SURGERY  1994   CARDIAC CATHETERIZATION  05/10/2005   normal coronaries, normal LV systolic function and EF (Dr. Jackie Plum)   [facility] Left 07/22/04   Dr. Aline Brochure   CATARACT EXTRACTION Bilateral    CHOLECYSTECTOMY  1984   COLONOSCOPY N/A 09/25/2012   [id number] diverticulosis.  colonic polyp-removed : tubular adenoma   CRANIOTOMY  11/23/2011   Procedure: CRANIOTOMY TUMOR EXCISION;  Surgeon: Hosie Spangle, MD;  Location: [facility];  Service: Neurosurgery;  Laterality: N/A;  Craniotomy for tumor resection   ESOPHAGOGASTRODUODENOSCOPY  12/29/2010   [place]-Retained food in the esophagus and stomach, small hiatal hernia, status post Maloney dilation of the esophagus   ESOPHAGOGASTRODUODENOSCOPY N/A 09/25/2012   [id number] atonic baggy esophagus status post Maloney dilation 24 F. Hiatal hernia   GIVENS CAPSULE STUDY N/A 01/15/2013   NORMAL.    IR GENERIC HISTORICAL  03/17/2016   IR RADIOLOGIST EVAL & MGMT 03/17/2016 [facility]   LESION REMOVAL N/A 05/31/2015   Procedure: REMOVAL RIGHT AND LEFT LESIONS OF MANDIBLE;  Surgeon: Diona Browner, DDS;  Location: [facility];  Service: Oral Surgery;  Laterality: N/A;   MALONEY DILATION  12/29/2010   RMR;   NM MYOCAR PERF WALL MOTION  2006   "relavtiely normal" persantine, mild anterior thinning (breast attenuation artifact), no region of scar/ischemia   OVARIAN CYST REMOVAL     RECTOCELE REPAIR N/A 06/29/2015   Procedure: POSTERIOR REPAIR (RECTOCELE);  Surgeon: Jonnie Kind, MD;  Location: [facility];  Service: Gynecology;  Laterality: N/A;   REDUCTION MAMMAPLASTY Bilateral    SPINE SURGERY  09/29/2010   Dr. Rolena Infante   surgical excision of 3 tumors from right thigh and right buttock  and left upper thigh  2010   TOOTH EXTRACTION Bilateral 12/14/2014   Procedure: REMOVAL OF BILATERAL  MANDIBULAR EXOSTOSES;  Surgeon: Diona Browner, DDS;  Location: [facility];  Service: Oral Surgery;  Laterality: Bilateral;   TRANSTHORACIC ECHOCARDIOGRAM  2010   EF 60-65%, mild conc LVH, grade 1 diastolic dysfunction; mildly calcified MV annulus with mildly thickened leaflets, mildly calcified MR annulus    OB History     Gravida  6   Para  1   Term  1   Preterm      AB  5   Living  SAB  5   IAB      Ectopic      Multiple      Live Births               Home Medications    Prior to Admission medications   Medication Sig Start Date End Date Taking? Authorizing Provider  ACCU-CHEK GUIDE test strip USE TO CHECK BLOOD SUGAR FOUR TIMES A DAY AND AS NEEDED (NEED MD APPOINTMENT FOR ANY FURTHER REFILLS) 05/10/22   Philemon Kingdom, MD  Accu-Chek Softclix Lancets lancets TEST BLOOD SUGAR THREE TIMES DAILY AS DIRECTED 03/28/21   Philemon Kingdom, MD  acetaminophen (TYLENOL) 325 MG tablet Take 650 mg by mouth every 6 (six) hours as needed.    [provider]  albuterol (PROVENTIL) (2.5 MG/3ML) 0.083% nebulizer solution Take 3 mLs (2.5 mg total) by nebulization every 6 (six) hours as needed for wheezing or shortness of breath. 11/25/21   Icard, Octavio Graves, DO  albuterol (VENTOLIN HFA) 108 (90 Base) MCG/ACT inhaler INHALE 1 TO 2 PUFFS EVERY 6 HOURS AS NEEDED FOR WHEEZING, SHORTNESS OF BREATH Patient taking differently: Inhale 1-2 puffs into the lungs every 6 (six) hours as needed for wheezing or shortness of breath. 06/24/21   Deneise Lever, MD  Alcohol Swabs (DROPSAFE ALCOHOL PREP) 70 % PADS USE TO CLEAN FINGER PRIOR TO TESTING FOR BLOOD SUGAR AS DIRECTED 05/10/22   Philemon Kingdom, MD  alendronate (FOSAMAX) 70 MG tablet TAKE 1 TABLET EVERY 7 DAYS ON AN EMPTY STOMACH WITH A FULL GLASS OF WATER Patient taking differently: Take 70 mg by mouth once a week. 11/07/21   [name], MD  amLODipine (NORVASC) 10 MG tablet TAKE 1 TABLET EVERY DAY 04/11/22   Imogene Burn, PA-C  ascorbic acid (VITAMIN C) 500 MG tablet Take 500 mg by mouth daily.    [provider]  aspirin EC 81 MG tablet Take 1 tablet (81 mg total) by mouth daily with breakfast. 12/08/18   Emokpae, Courage, MD  benzonatate (TESSALON) 200 MG capsule Take 1 capsule (200 mg total) by mouth 3 (three) times daily as needed for cough. 03/17/22   Baird Lyons D, MD  Blood Glucose Calibration (ACCU-CHEK GUIDE CONTROL) LIQD USE AS DIRECTED 12/13/20   Philemon Kingdom, MD  blood glucose meter kit and supplies Dispense based on patient and insurance preference. Use up to four times daily as directed. (FOR ICD-10 E10.9, E11.9). 06/27/19   Philemon Kingdom, MD  buPROPion (WELLBUTRIN XL) 150 MG 24 hr tablet Take 1 tablet (150 mg total) by mouth every morning. 04/03/22 04/03/23  Cloria Spring, MD  Calcium Carb-Cholecalciferol (CALTRATE 600+D3 SOFT) 600-20 MG-MCG CHEW Chew 1 tablet by mouth daily at 12 noon.    [provider]  cetirizine (ZYRTEC) 10 MG tablet Take 1 tablet (10 mg total) by mouth daily. 04/25/21   Renee Rival, FNP  Cholecalciferol (D3 PO) Take 1 tablet by mouth daily.    [provider]  Continuous Blood Gluc Sensor (FREESTYLE LIBRE 14 DAY SENSOR) MISC 1 each by Does not apply route every 14 (fourteen) days. Change every 2 weeks 06/24/19   Philemon Kingdom, MD  diazepam (VALIUM) 5 MG tablet Take 1 tablet (5 mg total) by mouth once as needed (MRI claustrophobia). 04/27/22   Ventura Sellers, MD  Docusate Sodium (DSS) 100 MG CAPS Take 1 tablet by mouth in the morning and at bedtime.    [provider]  DROPLET PEN NEEDLES 31G  X 8 MM MISC USE FOR INJECTING INSULIN 4 TIMES DAILY. 09/02/21   Philemon Kingdom, MD  Elastic Bandages & Supports (ADJUSTABLE ARM SLING) MISC L arm sling 05/04/22   [name], MD  ezetimibe (ZETIA) 10 MG tablet TAKE 1 TABLET EVERY DAY 01/19/22   [name], MD  insulin aspart (NOVOLOG FLEXPEN) 100 UNIT/ML  FlexPen Inject 5 Units into the skin 3 (three) times daily with meals. Give if eats 50% or more of meal. Patient taking differently: Inject 14 Units into the skin 3 (three) times daily with meals. Give if eats 50% or more of meal. 09/17/21   Johnson, Clanford L, MD  ipratropium (ATROVENT) 0.03 % nasal spray Place 2 sprays into both nostrils every 12 (twelve) hours. 01/16/22   Martyn Ehrich, NP  lactobacillus acidophilus (BACID) TABS tablet Take 2 tablets by mouth 3 (three) times daily.    [provider]  lamoTRIgine (LAMICTAL) 100 MG tablet Take 1 tablet (100 mg total) by mouth 2 (two) times daily. 04/03/22   Cloria Spring, MD  levothyroxine (SYNTHROID) 50 MCG tablet TAKE 1 TABLET DAILY SIX DAYS A WEEK AND 1/2 TABLET ON ONE DAY A WEEK 01/30/22   Philemon Kingdom, MD  LORazepam (ATIVAN) 0.5 MG tablet TAKE 1 TABLET TWICE DAILY 04/13/22   Cloria Spring, MD  methocarbamol (ROBAXIN-750) 750 MG tablet Take 1 tablet (750 mg total) by mouth every 8 (eight) hours as needed for muscle spasms. 05/22/22   Lyndal Pulley, MD  metoprolol tartrate (LOPRESSOR) 50 MG tablet TAKE 1 TABLET TWICE DAILY (NEED MD APPOINTMENT) Patient taking differently: Take 50 mg by mouth 2 (two) times daily. 08/29/21   Strader, Fransisco Hertz, PA-C  Misc. Devices (MATTRESS PAD) MISC FIRM MATTRESS PAD for hospital bed x 1 02/22/21   [name], MD  montelukast (SINGULAIR) 10 MG tablet TAKE 1 TABLET EVERY DAY 05/10/22   [name], MD  Multiple Vitamins-Minerals (MULTIVITAMIN GUMMIES ADULT PO) Take 1 tablet by mouth daily.    [provider]  MYRBETRIQ 25 MG TB24 tablet TAKE 1 TABLET EVERY DAY 05/10/22   [name], MD  Omega-3 Fatty Acids (FISH OIL PO) Take 1 capsule by mouth daily.    [provider]  oxyCODONE-acetaminophen (PERCOCET) 10-325 MG tablet Take 1 tablet by mouth every 8 (eight) hours as needed for pain. 11/01/20   [provider]  potassium chloride (KLOR-CON) 10  MEQ tablet Take 1 tablet (10 mEq total) by mouth 2 (two) times daily. 02/01/22   [name], MD  pregabalin (LYRICA) 75 MG capsule Take 1 capsule (75 mg total) by mouth daily. 10/20/16   Rexene Alberts, MD  RABEprazole (ACIPHEX) 20 MG tablet TAKE 1 TABLET TWICE DAILY 11/09/21   Pyrtle, Lajuan Lines, MD  RESTASIS 0.05 % ophthalmic emulsion Place 2 drops into both eyes daily. 07/29/20   [provider]  risperiDONE (RISPERDAL) 0.5 MG tablet Take 1 tablet (0.5 mg total) by mouth at bedtime. 04/03/22   Cloria Spring, MD  rosuvastatin (CRESTOR) 5 MG tablet TAKE 1 TABLET AT BEDTIME. 11/07/21   [name], MD  sertraline (ZOLOFT) 100 MG tablet Take 1 tablet (100 mg total) by mouth every morning. 04/03/22   Cloria Spring, MD  [facility] injection  02/24/22   [provider]  spironolactone (ALDACTONE) 50 MG tablet TAKE 1 TABLET (50 MG TOTAL) BY MOUTH DAILY. DISCONTINUE SPIRONOLACTONE 25MG  04/18/22   [name], MD  [place]  2.5-2.5 MCG/ACT AERS INHALE 2 PUFFS INTO THE LUNGS DAILY. 05/31/20   Parrett, Fonnie Mu, NP  telmisartan (MICARDIS) 20 MG tablet Take 10 mg by mouth daily. 11/23/21   [provider]  TOUJEO MAX SOLOSTAR 300 UNIT/ML Solostar Pen INJECT 20-26 UNITS INTO THE SKIN DAILY. 04/24/22   Philemon Kingdom, MD  traZODone (DESYREL) 150 MG tablet Take 1 tablet (150 mg total) by mouth at bedtime. 04/03/22   Cloria Spring, MD  vitamin E 180 MG (400 UNITS) capsule Take 400 Units by mouth daily.    [provider]    Family History Family History  Problem Relation Age of Onset   Heart attack Mother        HTN   Pneumonia Father    Kidney failure Father    Diabetes Father    Pancreatic cancer Sister    Cancer Sister        breast    Cancer Sister        pancreatic   Diabetes Brother    Hypertension Brother    Diabetes Brother    Alcohol abuse Maternal Uncle    Stroke Maternal Grandmother    Heart attack Maternal Grandfather     Hypertension Son    Sleep apnea Son    Colon cancer Neg Hx    Anesthesia problems Neg Hx    Hypotension Neg Hx    Malignant hyperthermia Neg Hx    Pseudochol deficiency Neg Hx    Breast cancer Neg Hx    Stomach cancer Neg Hx     Social History Social History   Tobacco Use   Smoking status: Former    Packs/day: 0.25    Years: 30.00    Additional pack years: 0.00    Total pack years: 7.50    Types: Cigarettes    Quit date: 07/05/2021    Years since quitting: 0.9    Passive exposure: Past   Smokeless tobacco: Never   Tobacco comments:    3-4 a day MRC 05/17/2021  Vaping Use   Vaping Use: Never used  Substance Use Topics   Alcohol use: No    Alcohol/week: 0.0 standard drinks of alcohol    Comment:     Drug use: No     Allergies   Iron, Milk (cow), Penicillins, Phenazopyridine, Phenazopyridine hcl, Cephalexin, Flonase [fluticasone], Milk-related compounds, and Phenazopyridine hcl   Review of Systems Review of Systems Per HPI  Physical Exam Triage Vital Signs ED Triage Vitals  Enc Vitals Group     BP 06/06/22 1137 (!) 154/64     Pulse Rate 06/06/22 1137 71     Resp 06/06/22 1137 20     Temp 06/06/22 1137 98.3 F (36.8 C)     Temp Source 06/06/22 1137 Oral     SpO2 06/06/22 1137 97 %     Weight --      Height --      Head Circumference --      Peak Flow --      Pain Score 06/06/22 1136 6     Pain Loc --      Pain Edu? --      Excl. in [place]? --    No data found.  Updated Vital Signs BP (!) 154/64 (BP Location: Right Arm)   Pulse 71   Temp 98.3 F (36.8 C) (Oral)   Resp 20   SpO2 97%   Visual Acuity Right Eye Distance:   Left Eye Distance:  Bilateral Distance:    Right Eye Near:   Left Eye Near:    Bilateral Near:     Physical Exam Vitals and nursing note reviewed.  Constitutional:      General: She is not in acute distress.    Appearance: Normal appearance.  HENT:     Head: Normocephalic.  Eyes:     Extraocular Movements: Extraocular  movements intact.     Pupils: Pupils are equal, round, and reactive to light.  Cardiovascular:     Rate and Rhythm: Normal rate and regular rhythm.     Pulses: Normal pulses.     Heart sounds: Normal heart sounds.  Pulmonary:     Effort: Pulmonary effort is normal.     Breath sounds: Normal breath sounds.  Abdominal:     General: Bowel sounds are normal.     Palpations: Abdomen is soft.  Musculoskeletal:     Cervical back: Normal range of motion.     Right lower leg: No edema.     Left lower leg: Tenderness (Tenderness noted to the lateral and posterior aspect of the left calf.) present. No swelling, deformity or lacerations. No edema.     Left ankle: Normal.     Comments: Negative Homan's sign. Varicosities noted along the lateral aspect of the patient's calf. Area is tender to palpation.   Lymphadenopathy:     Cervical: No cervical adenopathy.  Skin:    General: Skin is warm and dry.  Neurological:     General: No focal deficit present.     Mental Status: She is alert and oriented to person, place, and time.  Psychiatric:        Mood and Affect: Mood normal.        Behavior: Behavior normal.      UC Treatments / Results  Labs (all labs ordered are listed, but only abnormal results are displayed) Labs Reviewed - No data to display  EKG   Radiology US Venous Img Lower Unilateral Left (DVT)  Result Date: 06/06/2022 CLINICAL DATA:  Left calf pain EXAM: LEFT LOWER EXTREMITY VENOUS DOPPLER ULTRASOUND TECHNIQUE: Gray-scale sonography with compression, as well as color and duplex ultrasound, were performed to evaluate the deep venous system(s) from the level of the common femoral vein through the popliteal and proximal calf veins. COMPARISON:  None Available. FINDINGS: VENOUS Normal compressibility of the common femoral, superficial femoral, and popliteal veins, as well as the visualized calf veins. Visualized portions of profunda femoral vein and great saphenous vein  unremarkable. No filling defects to suggest DVT on grayscale or color Doppler imaging. Doppler waveforms show normal direction of venous flow, normal respiratory plasticity and response to augmentation. Limited views of the contralateral common femoral vein are unremarkable. OTHER None. Limitations: none IMPRESSION: Negative. Electronically Signed   By: Merilyn Baba M.D.   On: 06/06/2022 13:12     Procedures Procedures (including critical care time)  Medications Ordered in UC Medications - No data to display  Initial Impression / Assessment and Plan / UC Course  I have reviewed the triage vital signs and the nursing notes.  Pertinent labs & imaging results that were available during my care of the patient were reviewed by me and considered in my medical decision making (see chart for details).  Patient with complaints of left calf pain has been present for the past 5 days.  On exam, Bevelyn Buckles' sign is negative, she has no erythema or swelling noted.  Patient with significant past medical history.  Patient's risk factors include current smoker, history of cancer, and she is mostly sedentary.  Wells DVT score was 2, which classifies patient as "moderate risk" for DVT.  Ultrasound was ordered and patient was sent to [facility] for imaging of the left calf.  Patient was advised that she will be contacted once the results are received.  Patient and caregiver are in agreement with this plan of care.  All questions were answered.  Patient stable for discharge.    Final Clinical Impressions(s) / UC Diagnoses   Final diagnoses:  Pain of left calf     Discharge Instructions      I recommend that you have an ultrasound of your left calf to ensure you do not have a blood clot or DVT. Please go to [facility] for an ultrasound of your right lower leg.    ED Prescriptions   None    PDMP not reviewed this encounter.   Tish Men, NP 06/06/22 1333

## 2022-06-06 NOTE — Telephone Encounter (Signed)
Called patient regarding ultrasound results.  Spoke with the patient, verified 2 patient identifiers including her name and date of birth.  Patient was advised that the ultrasound of her left calf was negative for DVT.  Patient was advised to follow-up with her primary care physician for continued or worsening symptoms.  Patient verbalizes understanding.  All questions were answered.

## 2022-06-06 NOTE — Discharge Instructions (Addendum)
I recommend that you have an ultrasound of your left calf to ensure you do not have a blood clot or DVT. Please go to [facility] for an ultrasound of your right lower leg.

## 2022-06-07 NOTE — Addendum Note (Signed)
Addended by: Lyndal Pulley on: 06/07/2022 08:29 AM   Modules accepted: Orders

## 2022-06-07 NOTE — Progress Notes (Signed)
Ortho referral cancelled. Patient is going to follow up with an orthopedist in [place] she is established with.

## 2022-06-08 ENCOUNTER — Telehealth: Payer: Self-pay | Admitting: Family Medicine

## 2022-06-08 ENCOUNTER — Other Ambulatory Visit: Payer: Self-pay | Admitting: Family Medicine

## 2022-06-08 DIAGNOSIS — Z79899 Other long term (current) drug therapy: Secondary | ICD-10-CM

## 2022-06-08 NOTE — Telephone Encounter (Signed)
Spoke with pt and she is aware a new referral has been placed for [facility]

## 2022-06-08 NOTE — Telephone Encounter (Signed)
Patient called said she has no one to put her meds together in her little box.  Her aid [id number] not do that she said. She asked for nurse to call her [email or web address]

## 2022-06-08 NOTE — Progress Notes (Signed)
Cardiology Office Note:    Date:  06/12/2022   ID:  [name], DOB 03-14-1950, MRN [id number]  PCP:  [name], MD   [facility] Providers Cardiologist:  Fransico Him, MD     Referring MD: [name], MD   Chief Complaint: follow-up hypertension  History of Present Illness:    [name] is a very pleasant 72 y.o. female with a hx of labile hypertension, HLD, chest pain with normal cardiac cath 2007, low risk nuclear stress test 07/2017, OSA on CPAP followed by pulmonary, former tobacco abuse, anxiety and depression.  Patient had ultrasound for leg swelling 02/27/2020 that showed no DVT but mild to moderate superficial edema and entire medial calf on the right no common femoral vein obstruction on the left.  Last cardiology clinic visit was 06/09/2021 with [name]. Reported chronic chest pain and wheezing that occurs with exertion, felt that it was worse. Occurs sporadically and can last up to 10 minutes then goes away when she takes aspirin.  Reported pain in her gums when she gets emotionally upset.  Chronic neck and shoulder pain from cervical DJD and rotator cuff.  LDL was 79 and triglycerides 200.  She was advised to increase Crestor to 10 mg daily and repeat fasting labs in 6 weeks.  Lexiscan Myoview was completed on 06/17/2021 to rule out worsening ischemia and revealed low risk test with no ischemia or infarction, normal LVEF. She was encouraged to get CT calcium score.   Today, she is here for follow-up. She is here alone and uses a rolling walker. Having a great deal of shoulder pain, needs rotator cuff surgery. Is followed by pain management, only gets relief for a few hours. Followed by nephrology for CKD. Sees a lot of different medical providers and has a long list of medications. Asks about a history of heart failure from a past hospitalization. We reviewed cardiac history and previous tests with her, I do not see a CHF diagnosis but too many records to  review in a short time. Is concerned about family history. No specific cardiac symptoms. She denies chest pain, lower extremity edema, fatigue, palpitations, melena, hematuria, hemoptysis, diaphoresis, weakness, presyncope, syncope, orthopnea, and PND. Gets short of breath with vacuuming. Limited by chronic pain and deconditioning.   Past Medical History:  Diagnosis Date   Allergy    Anemia    Anemia in chronic kidney disease (CKD) 08/10/2021   Anemia in chronic kidney disease (CKD) 10/12/2021   Anxiety    takes Ativan daily   Arthritis    Assistance needed for mobility    Bipolar disorder    takes Risperdal nightly   Blood transfusion    Brain tumor    Cancer    In her gum   Carpal tunnel syndrome of right wrist 05/23/2011   Cervical disc disorder with radiculopathy of cervical region 10/31/2012   Chronic back pain    Chronic idiopathic constipation    Chronic neck and back pain    Colon polyps    COPD (chronic obstructive pulmonary disease) with chronic bronchitis 09/16/2013   Office Spirometry 10/30/2013-submaximal effort based on appearance of loop and curve. Numbers would fit with severe restriction but her physiologic capability may be better than this. FVC 0.91/44%, and 10.74/45%, FEV1/FVC 0.81, FEF 25-75% 1.43/69%     Diabetes mellitus    Type II   Diverticulosis    TCS 9/08 by Dr. Delfin Edis for diarrhea . Bx for micro  scopic colitis negative.    Fibromyalgia    Frequent falls    GERD (gastroesophageal reflux disease)    takes Aciphex daily   Glaucoma    eye drops daily   Gum symptoms    infection on antibiotic   Hiatal hernia    Hyperlipidemia    takes Crestor daily   Hypertension    takes Amlodipine,Metoprolol,and Clonidine daily   Hypothyroidism    takes Synthroid daily   IBS (irritable bowel syndrome)    Insomnia    takes Trazodone nightly   Major depression, recurrent    takes Zoloft daily   Malignant hyperpyrexia 0000000   Metabolic encephalopathy  [id number]   Migraines    chronic headaches   Mononeuritis lower limb    Narcolepsy    Osteoporosis    Pancreatitis 2006   due to Depakote with normal EUS    Paralysis    Schatzki's ring    non critical / EGD with ED 8/2011with RMR   Seizures    takes Lamictal daily.Last seizure 3 yrs ago   Sleep apnea    on CPAP   Small bowel obstruction    Stroke    left sided weakness, speech changes   Tubular adenoma of colon     Past Surgical History:  Procedure Laterality Date   ABDOMINAL HYSTERECTOMY  1978   BACK SURGERY  July 2012   BACTERIAL OVERGROWTH TEST N/A 05/05/2013   Procedure: BACTERIAL OVERGROWTH TEST;  Surgeon: Daneil Dolin, MD;  Location: [facility];  Service: Endoscopy;  Laterality: N/A;  7:30   BIOPSY THYROID  2009   BRAIN SURGERY  11/2011   resection of meningioma   BREAST REDUCTION SURGERY  1994   CARDIAC CATHETERIZATION  05/10/2005   normal coronaries, normal LV systolic function and EF (Dr. Jackie Plum)   Spray Left 07/22/04   Dr. Aline Brochure   CATARACT EXTRACTION Bilateral    CHOLECYSTECTOMY  1984   COLONOSCOPY N/A 09/25/2012   [id number] diverticulosis.  colonic polyp-removed : tubular adenoma   CRANIOTOMY  11/23/2011   Procedure: CRANIOTOMY TUMOR EXCISION;  Surgeon: Hosie Spangle, MD;  Location: [place] [facility];  Service: Neurosurgery;  Laterality: N/A;  Craniotomy for tumor resection   ESOPHAGOGASTRODUODENOSCOPY  12/29/2010   Rourk-Retained food in the esophagus and stomach, small hiatal hernia, status post Maloney dilation of the esophagus   ESOPHAGOGASTRODUODENOSCOPY N/A 09/25/2012   [id number] atonic baggy esophagus status post Maloney dilation 55 F. Hiatal hernia   GIVENS CAPSULE STUDY N/A 01/15/2013   NORMAL.    IR GENERIC HISTORICAL  03/17/2016   IR RADIOLOGIST EVAL & MGMT 03/17/2016 [facility]   LESION REMOVAL N/A 05/31/2015   Procedure: REMOVAL RIGHT AND LEFT LESIONS OF MANDIBLE;  Surgeon: Diona Browner, DDS;  Location: [facility];  Service:  Oral Surgery;  Laterality: N/A;   MALONEY DILATION  12/29/2010   RMR;   NM MYOCAR PERF WALL MOTION  2006   "relavtiely normal" persantine, mild anterior thinning (breast attenuation artifact), no region of scar/ischemia   OVARIAN CYST REMOVAL     RECTOCELE REPAIR N/A 06/29/2015   Procedure: POSTERIOR REPAIR (RECTOCELE);  Surgeon: Jonnie Kind, MD;  Location: [facility];  Service: Gynecology;  Laterality: N/A;   REDUCTION MAMMAPLASTY Bilateral    SPINE SURGERY  09/29/2010   Dr. Rolena Infante   surgical excision of 3 tumors from right thigh and right buttock  and left upper thigh  2010   TOOTH EXTRACTION Bilateral 12/14/2014   Procedure:  REMOVAL OF BILATERAL MANDIBULAR EXOSTOSES;  Surgeon: Diona Browner, DDS;  Location: [place];  Service: Oral Surgery;  Laterality: Bilateral;   TRANSTHORACIC ECHOCARDIOGRAM  2010   EF 60-65%, mild conc LVH, grade 1 diastolic dysfunction; mildly calcified MV annulus with mildly thickened leaflets, mildly calcified MR annulus    Current Medications: Current Meds  Medication Sig   ACCU-CHEK GUIDE test strip USE TO CHECK BLOOD SUGAR FOUR TIMES A DAY AND AS NEEDED (NEED MD APPOINTMENT FOR ANY FURTHER REFILLS)   Accu-Chek Softclix Lancets lancets TEST BLOOD SUGAR THREE TIMES DAILY AS DIRECTED   acetaminophen (TYLENOL) 325 MG tablet Take 650 mg by mouth every 6 (six) hours as needed.   albuterol (PROVENTIL) (2.5 MG/3ML) 0.083% nebulizer solution Take 3 mLs (2.5 mg total) by nebulization every 6 (six) hours as needed for wheezing or shortness of breath.   albuterol (VENTOLIN HFA) 108 (90 Base) MCG/ACT inhaler INHALE 1 TO 2 PUFFS EVERY 6 HOURS AS NEEDED FOR WHEEZING, SHORTNESS OF BREATH (Patient taking differently: Inhale 1-2 puffs into the lungs every 6 (six) hours as needed for wheezing or shortness of breath.)   Alcohol Swabs (DROPSAFE ALCOHOL PREP) 70 % PADS USE TO CLEAN FINGER PRIOR TO TESTING FOR BLOOD SUGAR AS DIRECTED   alendronate (FOSAMAX) 70 MG tablet TAKE 1 TABLET  EVERY 7 DAYS ON AN EMPTY STOMACH WITH A FULL GLASS OF WATER (Patient taking differently: Take 70 mg by mouth once a week.)   amLODipine (NORVASC) 10 MG tablet TAKE 1 TABLET EVERY DAY   ascorbic acid (VITAMIN C) 500 MG tablet Take 500 mg by mouth daily.   aspirin EC 81 MG tablet Take 1 tablet (81 mg total) by mouth daily with breakfast.   benzonatate (TESSALON) 200 MG capsule Take 1 capsule (200 mg total) by mouth 3 (three) times daily as needed for cough.   Blood Glucose Calibration (ACCU-CHEK GUIDE CONTROL) LIQD USE AS DIRECTED   blood glucose meter kit and supplies Dispense based on patient and insurance preference. Use up to four times daily as directed. (FOR ICD-10 E10.9, E11.9).   buPROPion (WELLBUTRIN XL) 150 MG 24 hr tablet Take 1 tablet (150 mg total) by mouth every morning.   Calcium Carb-Cholecalciferol (CALTRATE 600+D3 SOFT) 600-20 MG-MCG CHEW Chew 1 tablet by mouth daily at 12 noon.   cetirizine (ZYRTEC) 10 MG tablet Take 1 tablet (10 mg total) by mouth daily.   Cholecalciferol (D3 PO) Take 1 tablet by mouth daily.   Continuous Blood Gluc Sensor (FREESTYLE LIBRE 14 DAY SENSOR) MISC 1 each by Does not apply route every 14 (fourteen) days. Change every 2 weeks   Docusate Sodium (DSS) 100 MG CAPS Take 1 tablet by mouth in the morning and at bedtime.   DROPLET PEN NEEDLES 31G X 8 MM MISC USE FOR INJECTING INSULIN 4 TIMES DAILY.   Elastic Bandages & Supports (ADJUSTABLE ARM SLING) MISC L arm sling   ezetimibe (ZETIA) 10 MG tablet TAKE 1 TABLET EVERY DAY   insulin aspart (NOVOLOG FLEXPEN) 100 UNIT/ML FlexPen Inject 5 Units into the skin 3 (three) times daily with meals. Give if eats 50% or more of meal. (Patient taking differently: Inject 14 Units into the skin 3 (three) times daily with meals. Give if eats 50% or more of meal.)   ipratropium (ATROVENT) 0.03 % nasal spray Place 2 sprays into both nostrils every 12 (twelve) hours.   lactobacillus acidophilus (BACID) TABS tablet Take 2 tablets  by mouth 3 (three) times daily.   lamoTRIgine (  LAMICTAL) 100 MG tablet Take 1 tablet (100 mg total) by mouth 2 (two) times daily.   levothyroxine (SYNTHROID) 50 MCG tablet TAKE 1 TABLET DAILY SIX DAYS A WEEK AND 1/2 TABLET ON ONE DAY A WEEK   LORazepam (ATIVAN) 0.5 MG tablet TAKE 1 TABLET TWICE DAILY   methocarbamol (ROBAXIN-750) 750 MG tablet Take 1 tablet (750 mg total) by mouth every 8 (eight) hours as needed for muscle spasms.   metoprolol tartrate (LOPRESSOR) 50 MG tablet TAKE 1 TABLET TWICE DAILY (NEED MD APPOINTMENT) (Patient taking differently: Take 50 mg by mouth 2 (two) times daily.)   Misc. Devices (MATTRESS PAD) MISC FIRM MATTRESS PAD for hospital bed x 1   montelukast (SINGULAIR) 10 MG tablet TAKE 1 TABLET EVERY DAY   Multiple Vitamins-Minerals (MULTIVITAMIN GUMMIES ADULT PO) Take 1 tablet by mouth daily.   MYRBETRIQ 25 MG TB24 tablet TAKE 1 TABLET EVERY DAY   Omega-3 Fatty Acids (FISH OIL PO) Take 1 capsule by mouth daily.   oxyCODONE-acetaminophen (PERCOCET) 10-325 MG tablet Take 1 tablet by mouth every 8 (eight) hours as needed for pain.   pregabalin (LYRICA) 75 MG capsule Take 1 capsule (75 mg total) by mouth daily.   RABEprazole (ACIPHEX) 20 MG tablet TAKE 1 TABLET TWICE DAILY   RESTASIS 0.05 % ophthalmic emulsion Place 2 drops into both eyes daily.   risperiDONE (RISPERDAL) 0.5 MG tablet Take 1 tablet (0.5 mg total) by mouth at bedtime.   rosuvastatin (CRESTOR) 5 MG tablet TAKE 1 TABLET AT BEDTIME   sertraline (ZOLOFT) 100 MG tablet Take 1 tablet (100 mg total) by mouth every morning.   SPIKEVAX injection    spironolactone (ALDACTONE) 50 MG tablet TAKE 1 TABLET (50 MG TOTAL) BY MOUTH DAILY. DISCONTINUE SPIRONOLACTONE 25MG    STIOLTO RESPIMAT 2.5-2.5 MCG/ACT AERS INHALE 2 PUFFS INTO THE LUNGS DAILY.   telmisartan (MICARDIS) 20 MG tablet Take 10 mg by mouth daily.   TOUJEO MAX SOLOSTAR 300 UNIT/ML Solostar Pen INJECT 20-26 UNITS INTO THE SKIN DAILY.   traZODone (DESYREL) 150 MG  tablet Take 1 tablet (150 mg total) by mouth at bedtime.   vitamin E 180 MG (400 UNITS) capsule Take 400 Units by mouth daily.     Allergies:   Iron, Milk (cow), Penicillins, Phenazopyridine, Phenazopyridine hcl, Cephalexin, Flonase [fluticasone], Milk-related compounds, and Phenazopyridine hcl   Social History   Socioeconomic History   Marital status: Legally Separated    Spouse name: Not on file   Number of children: 1   Years of education: 12   Highest education level: High school graduate  Occupational History   Occupation: Disabled  Tobacco Use   Smoking status: Former    Packs/day: 0.25    Years: 30.00    Additional pack years: 0.00    Total pack years: 7.50    Types: Cigarettes    Quit date: 07/05/2021    Years since quitting: 0.9    Passive exposure: Past   Smokeless tobacco: Never   Tobacco comments:    3-4 a day MRC 05/17/2021  Vaping Use   Vaping Use: Never used  Substance and Sexual Activity   Alcohol use: No    Alcohol/week: 0.0 standard drinks of alcohol    Comment:     Drug use: No   Sexual activity: Not Currently  Other Topics Concern   Not on file  Social History Narrative   01/29/18 Lives alone, has 3 aides, Mon- Fri 8 hrs, 2 hrs on Sat-Sun, RN manages her meds  Caffeine use: Drink coffee sometimes    Right handed    Social Determinants of Health   Financial Resource Strain: Low Risk  (01/03/2022)   Overall Financial Resource Strain (CARDIA)    Difficulty of Paying Living Expenses: Not very hard  Food Insecurity: No Food Insecurity (01/03/2022)   Hunger Vital Sign    Worried About Running Out of Food in the Last Year: Never true    Ran Out of Food in the Last Year: Never true  Transportation Needs: No Transportation Needs (01/03/2022)   PRAPARE - Hydrologist (Medical): No    Lack of Transportation (Non-Medical): No  Physical Activity: Insufficiently Active (01/03/2022)   Exercise Vital Sign    Days of Exercise per  Week: 2 days    Minutes of Exercise per Session: 20 min  Stress: No Stress Concern Present (01/03/2022)   [place]    Feeling of Stress : Only a little  Social Connections: Moderately Integrated (01/03/2022)   Social Connection and Isolation Panel [NHANES]    Frequency of Communication with Friends and Family: Three times a week    Frequency of Social Gatherings with Friends and Family: Once a week    Attends Religious Services: More than 4 times per year    Active Member of [employer] or Organizations: Yes    Attends Music therapist: More than 4 times per year    Marital Status: Divorced     Family History: The patient's family history includes Alcohol abuse in her maternal uncle; Cancer in her sister and sister; Diabetes in her brother, brother, and father; Heart attack in her maternal grandfather and mother; Hypertension in her brother and son; Kidney failure in her father; Pancreatic cancer in her sister; Pneumonia in her father; Sleep apnea in her son; Stroke in her maternal grandmother. There is no history of Colon cancer, Anesthesia problems, Hypotension, Malignant hyperthermia, Pseudochol deficiency, Breast cancer, or Stomach cancer.  ROS:   Please see the history of present illness.   All other systems reviewed and are negative.  Labs/Other Studies Reviewed:    The following studies were reviewed today:  Lexiscan Myoview 06/27/21  Findings are consistent with no prior ischemia and no prior myocardial infarction. The study is low risk.   No ST deviation was noted.   LV perfusion is normal. There is no evidence of ischemia. There is no evidence of infarction.   Left ventricular function is normal. End diastolic cavity size is normal. End systolic cavity size is normal.   Diaphragmatic attenuation no ischemia or infarct EF 70%   US Carotid Bilateral 06/27/21 RIGHT   RIGHT CAROTID ARTERY: No  significant atheromatous plaque.   RIGHT VERTEBRAL ARTERY:  Antegrade flow.   LEFT CAROTID ARTERY: Mild heterogeneous plaque of the left ICA origin.   LEFT VERTEBRAL ARTERY:  Antegrade flow.   IMPRESSION: No significant stenosis of internal carotid arteries.     Recent Labs: 10/04/2021: Magnesium 2.1; TSH 0.494 05/12/2022: ALT 22; BUN 51; Creatinine, Ser 1.56; Potassium 4.6; Sodium 137 05/30/2022: Hemoglobin 10.2; Platelets 226  Recent Lipid Panel    Component Value Date/Time   CHOL 155 05/10/2021 1137   TRIG 200 (H) 05/10/2021 1137   HDL 42 05/10/2021 1137   CHOLHDL 3.7 05/10/2021 1137   CHOLHDL 3.5 09/30/2018 1143   VLDL 26 07/19/2015 0925   LDLCALC 79 05/10/2021 1137   LDLCALC 79 09/30/2018 1143     Risk  Assessment/Calculations:           Physical Exam:    VS:  BP 138/62   Pulse 68   Ht 4\' 11"  (1.499 m)   Wt 167 lb 6.4 oz (75.9 kg)   SpO2 94%   BMI 33.81 kg/m     Wt Readings from Last 3 Encounters:  06/12/22 167 lb 6.4 oz (75.9 kg)  06/01/22 166 lb 14.4 oz (75.7 kg)  05/30/22 167 lb 6.4 oz (75.9 kg)     GEN: Obese, well developed in no acute distress HEENT: Normal NECK: No JVD; No carotid bruits CARDIAC: RRR, no murmurs, rubs, gallops RESPIRATORY:  Clear to auscultation without rales, wheezing or rhonchi  ABDOMEN: Soft, non-tender, non-distended MUSCULOSKELETAL:  No edema; No deformity. 2+ pedal pulses, equal bilaterally SKIN: Warm and dry NEUROLOGIC:  Alert and oriented x 3 PSYCHIATRIC:  Normal affect   EKG:  EKG is not ordered today.      Diagnoses:    1. Essential hypertension   2. Hyperlipidemia LDL goal <70   3. Obesity (BMI 30.0-34.9)   4. Cardiac risk counseling   5. Chronic renal failure (CRF), stage 3b    Assessment and Plan:     Cardiac risk counseling: Multiple co-morbidities with family history of heart disease. Normal nuclear stress test 06/2021. Recommendation for CT calcium score but she did not want to pay $99. She is  currently asymptomatic. Encouraged lifestyle changes to incorporate more activity and heart healthy, low-sodium, mostly plant-based diet.  Hyperlipidemia LDL goal < 70: LDL 79 on 05/10/21. Continue rosuvastatin. Management per PCP.   CKD Stage 3b: Scr 1.56 on 05/12/22. Management per nephrology. No medication changes today.   Obesity: Reports she has been told by other providers that she needs to lose weight. Discussed lifestyle changes that may be beneficial to her. Information given on DASH diet and Mediterranean diet.   Hypertension: BP initially elevated but improved on my recheck. Management per nephrology/PCP.   Carotid artery disease: Mild bilateral carotid artery disease on ultrasound 06/2021. Asymptomatic. Continue to monitor clinically for now.     Disposition: 1 year with [name]  Medication Adjustments/Labs and Tests Ordered: Current medicines are reviewed at length with the patient today.  Concerns regarding medicines are outlined above.  No orders of the defined types were placed in this encounter.  No orders of the defined types were placed in this encounter.   Patient Instructions  Medication Instructions:   Your physician recommends that you continue on your current medications as directed. Please refer to the Current Medication list given to you today.   *If you need a refill on your cardiac medications before your next appointment, please call your pharmacy*   Lab Work:  None ordered.  If you have labs (blood work) drawn today and your tests are completely normal, you will receive your results only by: [place] (if you have MyChart) OR A paper copy in the mail If you have any lab test that is abnormal or we need to change your treatment, we will call you to review the results.   Testing/Procedures:  None ordered.   Follow-Up: At [facility], you and your health needs are our priority.  As part of our continuing mission to provide you  with exceptional heart care, we have created designated Provider Care Teams.  These Care Teams include your primary Cardiologist (physician) and Advanced Practice Providers (APPs -  Physician Assistants and Nurse Practitioners) who all work together to provide  you with the care you need, when you need it.  We recommend signing up for the patient portal called "MyChart".  Sign up information is provided on this After Visit Summary.  MyChart is used to connect with patients for Virtual Visits (Telemedicine).  Patients are able to view lab/test results, encounter notes, upcoming appointments, etc.  Non-urgent messages can be sent to your provider as well.   To learn more about what you can do with MyChart, go to [email or web address].    Your next appointment:   1 year(s)  Provider:   Fransico Him, MD     Other Instructions Your physician wants you to follow-up in: 1 year with [name].  You will receive a reminder letter in the mail two months in advance. If you don't receive a letter, please call our office to schedule the follow-up appointment.  Mediterranean Diet A Mediterranean diet refers to food and lifestyle choices that are based on the traditions of countries located on the [employer]. It focuses on eating more fruits, vegetables, whole grains, beans, nuts, seeds, and heart-healthy fats, and eating less dairy, meat, eggs, and processed foods with added sugar, salt, and fat. This way of eating has been shown to help prevent certain conditions and improve outcomes for people who have chronic diseases, like kidney disease and heart disease. What are tips for following this plan? Reading food labels Check the serving size of packaged foods. For foods such as rice and pasta, the serving size refers to the amount of cooked product, not dry. Check the total fat in packaged foods. Avoid foods that have saturated fat or trans fats. Check the ingredient list for added sugars, such as corn  syrup. Shopping  Buy a variety of foods that offer a balanced diet, including: Fresh fruits and vegetables (produce). Grains, beans, nuts, and seeds. Some of these may be available in unpackaged forms or large amounts (in bulk). Fresh seafood. Poultry and eggs. Low-fat dairy products. Buy whole ingredients instead of prepackaged foods. Buy fresh fruits and vegetables in-season from local farmers markets. Buy plain frozen fruits and vegetables. If you do not have access to quality fresh seafood, buy precooked frozen shrimp or canned fish, such as tuna, salmon, or sardines. Stock your pantry so you always have certain foods on hand, such as olive oil, canned tuna, canned tomatoes, rice, pasta, and beans. Cooking Cook foods with extra-virgin olive oil instead of using butter or other vegetable oils. Have meat as a side dish, and have vegetables or grains as your main dish. This means having meat in small portions or adding small amounts of meat to foods like pasta or stew. Use beans or vegetables instead of meat in common dishes like chili or lasagna. Experiment with different cooking methods. Try roasting, broiling, steaming, and sauting vegetables. Add frozen vegetables to soups, stews, pasta, or rice. Add nuts or seeds for added healthy fats and plant protein at each meal. You can add these to yogurt, salads, or vegetable dishes. Marinate fish or vegetables using olive oil, lemon juice, garlic, and fresh herbs. Meal planning Plan to eat one vegetarian meal one day each week. Try to work up to two vegetarian meals, if possible. Eat seafood two or more times a week. Have healthy snacks readily available, such as: Vegetable sticks with hummus. Greek yogurt. Fruit and nut trail mix. Eat balanced meals throughout the week. This includes: Fruit: 2-3 servings a day. Vegetables: 4-5 servings a day. Low-fat dairy: 2 servings a day.  Fish, poultry, or lean meat: 1 serving a day. Beans and  legumes: 2 or more servings a week. Nuts and seeds: 1-2 servings a day. Whole grains: 6-8 servings a day. Extra-virgin olive oil: 3-4 servings a day. Limit red meat and sweets to only a few servings a month. Lifestyle  Cook and eat meals together with your family, when possible. Drink enough fluid to keep your urine pale yellow. Be physically active every day. This includes: Aerobic exercise like running or swimming. Leisure activities like gardening, walking, or housework. Get 7-8 hours of sleep each night. If recommended by your health care provider, drink red wine in moderation. This means 1 glass a day for nonpregnant women and 2 glasses a day for men. A glass of wine equals 5 oz (150 mL). What foods should I eat? Fruits Apples. Apricots. Avocado. Berries. Bananas. Cherries. Dates. Figs. Grapes. Lemons. Melon. Oranges. Peaches. Plums. Pomegranate. Vegetables Artichokes. Beets. Broccoli. Cabbage. Carrots. Eggplant. Green beans. Chard. Kale. Spinach. Onions. Leeks. Peas. Squash. Tomatoes. Peppers. Radishes. Grains Whole-grain pasta. Brown rice. Bulgur wheat. Polenta. Couscous. Whole-wheat bread. [place]. Meats and other proteins Beans. Almonds. Sunflower seeds. Pine nuts. Peanuts. [place]. Salmon. Scallops. Shrimp. [place]. Tilapia. Clams. Oysters. Eggs. Poultry without skin. Dairy Low-fat milk. Cheese. Greek yogurt. Fats and oils Extra-virgin olive oil. Avocado oil. Grapeseed oil. Beverages Water. Red wine. Herbal tea. Sweets and desserts Greek yogurt with honey. Baked apples. Poached pears. Trail mix. Seasonings and condiments Basil. Cilantro. Coriander. Cumin. Mint. Parsley. Sage. Rosemary. Tarragon. Garlic. Oregano. Thyme. Pepper. Balsamic vinegar. Tahini. Hummus. Tomato sauce. Olives. Mushrooms. The items listed above may not be a complete list of foods and beverages you can eat. Contact a dietitian for more information. What foods should I limit? This is a list of foods  that should be eaten rarely or only on special occasions. Fruits Fruit canned in syrup. Vegetables Deep-fried potatoes (french fries). Grains Prepackaged pasta or rice dishes. Prepackaged cereal with added sugar. Prepackaged snacks with added sugar. Meats and other proteins Beef. Pork. Lamb. Poultry with skin. Hot dogs. Berniece Salines. Dairy Ice cream. Sour cream. Whole milk. Fats and oils Butter. Canola oil. Vegetable oil. Beef fat (tallow). Lard. Beverages Juice. Sugar-sweetened soft drinks. Beer. Liquor and spirits. Sweets and desserts Cookies. Cakes. Pies. Candy. Seasonings and condiments Mayonnaise. Pre-made sauces and marinades. The items listed above may not be a complete list of foods and beverages you should limit. Contact a dietitian for more information. Summary The Mediterranean diet includes both food and lifestyle choices. Eat a variety of fresh fruits and vegetables, beans, nuts, seeds, and whole grains. Limit the amount of red meat and sweets that you eat. If recommended by your health care provider, drink red wine in moderation. This means 1 glass a day for nonpregnant women and 2 glasses a day for men. A glass of wine equals 5 oz (150 mL). This information is not intended to replace advice given to you by your health care provider. Make sure you discuss any questions you have with your health care provider. Document Revised: 04/04/2019 Document Reviewed: 01/30/2019 Elsevier Patient Education  [place] Eating Plan DASH stands for Dietary Approaches to Stop Hypertension. The DASH eating plan is a healthy eating plan that has been shown to: Reduce high blood pressure (hypertension). Reduce your risk for type 2 diabetes, heart disease, and stroke. Help with weight loss. What are tips for following this plan? Reading food labels Check food labels for the amount of salt (sodium) per  serving. Choose foods with less than 5 percent of the Daily Value of sodium.  Generally, foods with less than 300 milligrams (mg) of sodium per serving fit into this eating plan. To find whole grains, look for the word "whole" as the first word in the ingredient list. Shopping Buy products labeled as "low-sodium" or "no salt added." Buy fresh foods. Avoid canned foods and pre-made or frozen meals. Cooking Avoid adding salt when cooking. Use salt-free seasonings or herbs instead of table salt or sea salt. Check with your health care provider or pharmacist before using salt substitutes. Do not fry foods. Cook foods using healthy methods such as baking, boiling, grilling, roasting, and broiling instead. Cook with heart-healthy oils, such as olive, canola, avocado, soybean, or sunflower oil. Meal planning  Eat a balanced diet that includes: 4 or more servings of fruits and 4 or more servings of vegetables each day. Try to fill one-half of your plate with fruits and vegetables. 6-8 servings of whole grains each day. Less than 6 oz (170 g) of lean meat, poultry, or fish each day. A 3-oz (85-g) serving of meat is about the same size as a deck of cards. One egg equals 1 oz (28 g). 2-3 servings of low-fat dairy each day. One serving is 1 cup (237 mL). 1 serving of nuts, seeds, or beans 5 times each week. 2-3 servings of heart-healthy fats. Healthy fats called omega-3 fatty acids are found in foods such as walnuts, flaxseeds, fortified milks, and eggs. These fats are also found in cold-water fish, such as sardines, salmon, and mackerel. Limit how much you eat of: Canned or prepackaged foods. Food that is high in trans fat, such as some fried foods. Food that is high in saturated fat, such as fatty meat. Desserts and other sweets, sugary drinks, and other foods with added sugar. Full-fat dairy products. Do not salt foods before eating. Do not eat more than 4 egg yolks a week. Try to eat at least 2 vegetarian meals a week. Eat more home-cooked food and less restaurant, buffet,  and fast food. Lifestyle When eating at a restaurant, ask that your food be prepared with less salt or no salt, if possible. If you drink alcohol: Limit how much you use to: 0-1 drink a day for women who are not pregnant. 0-2 drinks a day for men. Be aware of how much alcohol is in your drink. In the [place] one drink equals one 12 oz bottle of beer (355 mL), one 5 oz glass of wine (148 mL), or one 1 oz glass of hard liquor (44 mL). General information Avoid eating more than 2,300 mg of salt a day. If you have hypertension, you may need to reduce your sodium intake to 1,500 mg a day. Work with your health care provider to maintain a healthy body weight or to lose weight. Ask what an ideal weight is for you. Get at least 30 minutes of exercise that causes your heart to beat faster (aerobic exercise) most days of the week. Activities may include walking, swimming, or biking. Work with your health care provider or dietitian to adjust your eating plan to your individual calorie needs. What foods should I eat? Fruits All fresh, dried, or frozen fruit. Canned fruit in natural juice (without added sugar). Vegetables Fresh or frozen vegetables (raw, steamed, roasted, or grilled). Low-sodium or reduced-sodium tomato and vegetable juice. Low-sodium or reduced-sodium tomato sauce and tomato paste. Low-sodium or reduced-sodium canned vegetables. Grains Whole-grain or whole-wheat bread.  Whole-grain or whole-wheat pasta. Brown rice. [place]. Bulgur. Whole-grain and low-sodium cereals. Pita bread. Low-fat, low-sodium crackers. Whole-wheat flour tortillas. Meats and other proteins Skinless chicken or [place]. Ground chicken or [place]. Pork with fat trimmed off. Fish and seafood. Egg whites. Dried beans, peas, or lentils. Unsalted nuts, nut butters, and seeds. Unsalted canned beans. Lean cuts of beef with fat trimmed off. Low-sodium, lean precooked or cured meat, such as sausages or meat  loaves. Dairy Low-fat (1%) or fat-free (skim) milk. Reduced-fat, low-fat, or fat-free cheeses. Nonfat, low-sodium ricotta or cottage cheese. Low-fat or nonfat yogurt. Low-fat, low-sodium cheese. Fats and oils Soft margarine without trans fats. Vegetable oil. Reduced-fat, low-fat, or light mayonnaise and salad dressings (reduced-sodium). Canola, safflower, olive, avocado, soybean, and sunflower oils. Avocado. Seasonings and condiments Herbs. Spices. Seasoning mixes without salt. Other foods Unsalted popcorn and pretzels. Fat-free sweets. The items listed above may not be a complete list of foods and beverages you can eat. Contact a dietitian for more information. What foods should I avoid? Fruits Canned fruit in a light or heavy syrup. Fried fruit. Fruit in cream or butter sauce. Vegetables Creamed or fried vegetables. Vegetables in a cheese sauce. Regular canned vegetables (not low-sodium or reduced-sodium). Regular canned tomato sauce and paste (not low-sodium or reduced-sodium). Regular tomato and vegetable juice (not low-sodium or reduced-sodium). Angie Fava. Olives. Grains Baked goods made with fat, such as croissants, muffins, or some breads. Dry pasta or rice meal packs. Meats and other proteins Fatty cuts of meat. Ribs. Fried meat. Berniece Salines. Bologna, salami, and other precooked or cured meats, such as sausages or meat loaves. Fat from the back of a pig (fatback). Bratwurst. Salted nuts and seeds. Canned beans with added salt. Canned or smoked fish. Whole eggs or egg yolks. Chicken or [place] with skin. Dairy Whole or 2% milk, cream, and half-and-half. Whole or full-fat cream cheese. Whole-fat or sweetened yogurt. Full-fat cheese. Nondairy creamers. Whipped toppings. Processed cheese and cheese spreads. Fats and oils Butter. Stick margarine. Lard. Shortening. Ghee. Bacon fat. Tropical oils, such as coconut, palm kernel, or palm oil. Seasonings and condiments Onion salt, garlic salt, seasoned  salt, table salt, and sea salt. Worcestershire sauce. Tartar sauce. Barbecue sauce. Teriyaki sauce. Soy sauce, including reduced-sodium. Steak sauce. Canned and packaged gravies. Fish sauce. Oyster sauce. Cocktail sauce. Store-bought horseradish. Ketchup. Mustard. Meat flavorings and tenderizers. Bouillon cubes. Hot sauces. Pre-made or packaged marinades. Pre-made or packaged taco seasonings. Relishes. Regular salad dressings. Other foods Salted popcorn and pretzels. The items listed above may not be a complete list of foods and beverages you should avoid. Contact a dietitian for more information. Where to find more information National Heart, Lung, and Blood Institute: https://wilson-eaton.com/ American Heart Association: www.heart.org Academy of Nutrition and Dietetics: www.eatright.[place]: www.kidney.org Summary The DASH eating plan is a healthy eating plan that has been shown to reduce high blood pressure (hypertension). It may also reduce your risk for type 2 diabetes, heart disease, and stroke. When on the DASH eating plan, aim to eat more fresh fruits and vegetables, whole grains, lean proteins, low-fat dairy, and heart-healthy fats. With the DASH eating plan, you should limit salt (sodium) intake to 2,300 mg a day. If you have hypertension, you may need to reduce your sodium intake to 1,500 mg a day. Work with your health care provider or dietitian to adjust your eating plan to your individual calorie needs. This information is not intended to replace advice given to you by your health care provider.  Make sure you discuss any questions you have with your health care provider. Document Revised: 01/31/2019 Document Reviewed: 01/31/2019 Elsevier Patient Education  2023 [place], Emmaline Life, NP  06/12/2022 4:29 PM    [facility]

## 2022-06-08 NOTE — Telephone Encounter (Signed)
Called patient and left message for them to return call at the office   

## 2022-06-09 ENCOUNTER — Telehealth: Payer: Self-pay | Admitting: Family Medicine

## 2022-06-09 NOTE — Telephone Encounter (Signed)
Patient called asking can Dr Moshe Cipro sent in an order for a trapeze bar for her hospital bed.

## 2022-06-10 ENCOUNTER — Other Ambulatory Visit: Payer: Self-pay | Admitting: Family Medicine

## 2022-06-12 ENCOUNTER — Telehealth: Payer: Self-pay | Admitting: Family Medicine

## 2022-06-12 ENCOUNTER — Encounter: Payer: Self-pay | Admitting: Nurse Practitioner

## 2022-06-12 ENCOUNTER — Ambulatory Visit: Payer: Medicare HMO | Attending: Nurse Practitioner | Admitting: Nurse Practitioner

## 2022-06-12 VITALS — BP 138/62 | HR 68 | Ht 59.0 in | Wt 167.4 lb

## 2022-06-12 DIAGNOSIS — E785 Hyperlipidemia, unspecified: Secondary | ICD-10-CM

## 2022-06-12 DIAGNOSIS — Z7189 Other specified counseling: Secondary | ICD-10-CM

## 2022-06-12 DIAGNOSIS — I10 Essential (primary) hypertension: Secondary | ICD-10-CM

## 2022-06-12 DIAGNOSIS — N1832 Chronic kidney disease, stage 3b: Secondary | ICD-10-CM

## 2022-06-12 DIAGNOSIS — E669 Obesity, unspecified: Secondary | ICD-10-CM | POA: Diagnosis not present

## 2022-06-12 DIAGNOSIS — J449 Chronic obstructive pulmonary disease, unspecified: Secondary | ICD-10-CM | POA: Diagnosis not present

## 2022-06-12 NOTE — Telephone Encounter (Signed)
Patient called needs assistance to come out to her home to help her with all her medicine, she has so much medicines she does not know what to do, concern she does not know that she has it right and does not know what to do.  She did say [employer] will no longer fix this for her.

## 2022-06-12 NOTE — Telephone Encounter (Signed)
A referral has been placed. We are just waiting to see if [name] is going to approve it

## 2022-06-12 NOTE — Telephone Encounter (Addendum)
Pt called in looking for nurse to manage med. Needs referral to [facility] or [facility] . Patient wants a call back.

## 2022-06-12 NOTE — Telephone Encounter (Signed)
Spoke with Santiago Glad and entered a new referral for med management. She did say that [facility] may not cover home health nursing if all they are doing is fixing her pill organizer. I mentioned getting meds bubble packed if not but pt states she has always had someone to fix her meds for her

## 2022-06-12 NOTE — Patient Instructions (Addendum)
Medication Instructions:   Your physician recommends that you continue on your current medications as directed. Please refer to the Current Medication list given to you today.   *If you need a refill on your cardiac medications before your next appointment, please call your pharmacy*   Lab Work:  None ordered.  If you have labs (blood work) drawn today and your tests are completely normal, you will receive your results only by: [place] (if you have MyChart) OR A paper copy in the mail If you have any lab test that is abnormal or we need to change your treatment, we will call you to review the results.   Testing/Procedures:  None ordered.   Follow-Up: At [facility], you and your health needs are our priority.  As part of our continuing mission to provide you with exceptional heart care, we have created designated Provider Care Teams.  These Care Teams include your primary Cardiologist (physician) and Advanced Practice Providers (APPs -  Physician Assistants and Nurse Practitioners) who all work together to provide you with the care you need, when you need it.  We recommend signing up for the patient portal called "MyChart".  Sign up information is provided on this After Visit Summary.  MyChart is used to connect with patients for Virtual Visits (Telemedicine).  Patients are able to view lab/test results, encounter notes, upcoming appointments, etc.  Non-urgent messages can be sent to your provider as well.   To learn more about what you can do with MyChart, go to [email or web address].    Your next appointment:   1 year(s)  Provider:   Fransico Him, MD     Other Instructions Your physician wants you to follow-up in: 1 year with [name].  You will receive a reminder letter in the mail two months in advance. If you don't receive a letter, please call our office to schedule the follow-up appointment.  Mediterranean Diet A Mediterranean diet refers to food  and lifestyle choices that are based on the traditions of countries located on the [employer]. It focuses on eating more fruits, vegetables, whole grains, beans, nuts, seeds, and heart-healthy fats, and eating less dairy, meat, eggs, and processed foods with added sugar, salt, and fat. This way of eating has been shown to help prevent certain conditions and improve outcomes for people who have chronic diseases, like kidney disease and heart disease. What are tips for following this plan? Reading food labels Check the serving size of packaged foods. For foods such as rice and pasta, the serving size refers to the amount of cooked product, not dry. Check the total fat in packaged foods. Avoid foods that have saturated fat or trans fats. Check the ingredient list for added sugars, such as corn syrup. Shopping  Buy a variety of foods that offer a balanced diet, including: Fresh fruits and vegetables (produce). Grains, beans, nuts, and seeds. Some of these may be available in unpackaged forms or large amounts (in bulk). Fresh seafood. Poultry and eggs. Low-fat dairy products. Buy whole ingredients instead of prepackaged foods. Buy fresh fruits and vegetables in-season from local farmers markets. Buy plain frozen fruits and vegetables. If you do not have access to quality fresh seafood, buy precooked frozen shrimp or canned fish, such as tuna, salmon, or sardines. Stock your pantry so you always have certain foods on hand, such as olive oil, canned tuna, canned tomatoes, rice, pasta, and beans. Cooking Cook foods with extra-virgin olive oil instead of using butter or  other vegetable oils. Have meat as a side dish, and have vegetables or grains as your main dish. This means having meat in small portions or adding small amounts of meat to foods like pasta or stew. Use beans or vegetables instead of meat in common dishes like chili or lasagna. Experiment with different cooking methods. Try  roasting, broiling, steaming, and sauting vegetables. Add frozen vegetables to soups, stews, pasta, or rice. Add nuts or seeds for added healthy fats and plant protein at each meal. You can add these to yogurt, salads, or vegetable dishes. Marinate fish or vegetables using olive oil, lemon juice, garlic, and fresh herbs. Meal planning Plan to eat one vegetarian meal one day each week. Try to work up to two vegetarian meals, if possible. Eat seafood two or more times a week. Have healthy snacks readily available, such as: Vegetable sticks with hummus. Greek yogurt. Fruit and nut trail mix. Eat balanced meals throughout the week. This includes: Fruit: 2-3 servings a day. Vegetables: 4-5 servings a day. Low-fat dairy: 2 servings a day. Fish, poultry, or lean meat: 1 serving a day. Beans and legumes: 2 or more servings a week. Nuts and seeds: 1-2 servings a day. Whole grains: 6-8 servings a day. Extra-virgin olive oil: 3-4 servings a day. Limit red meat and sweets to only a few servings a month. Lifestyle  Cook and eat meals together with your family, when possible. Drink enough fluid to keep your urine pale yellow. Be physically active every day. This includes: Aerobic exercise like running or swimming. Leisure activities like gardening, walking, or housework. Get 7-8 hours of sleep each night. If recommended by your health care provider, drink red wine in moderation. This means 1 glass a day for nonpregnant women and 2 glasses a day for men. A glass of wine equals 5 oz (150 mL). What foods should I eat? Fruits Apples. Apricots. Avocado. Berries. Bananas. Cherries. Dates. Figs. Grapes. Lemons. Melon. Oranges. Peaches. Plums. Pomegranate. Vegetables Artichokes. Beets. Broccoli. Cabbage. Carrots. Eggplant. Green beans. Chard. Kale. Spinach. Onions. Leeks. Peas. Squash. Tomatoes. Peppers. Radishes. Grains Whole-grain pasta. Brown rice. Bulgur wheat. Polenta. Couscous. Whole-wheat  bread. [place]. Meats and other proteins Beans. Almonds. Sunflower seeds. Pine nuts. Peanuts. [place]. Salmon. Scallops. Shrimp. [place]. Tilapia. Clams. Oysters. Eggs. Poultry without skin. Dairy Low-fat milk. Cheese. Greek yogurt. Fats and oils Extra-virgin olive oil. Avocado oil. Grapeseed oil. Beverages Water. Red wine. Herbal tea. Sweets and desserts Greek yogurt with honey. Baked apples. Poached pears. Trail mix. Seasonings and condiments Basil. Cilantro. Coriander. Cumin. Mint. Parsley. Sage. Rosemary. Tarragon. Garlic. Oregano. Thyme. Pepper. Balsamic vinegar. Tahini. Hummus. Tomato sauce. Olives. Mushrooms. The items listed above may not be a complete list of foods and beverages you can eat. Contact a dietitian for more information. What foods should I limit? This is a list of foods that should be eaten rarely or only on special occasions. Fruits Fruit canned in syrup. Vegetables Deep-fried potatoes (french fries). Grains Prepackaged pasta or rice dishes. Prepackaged cereal with added sugar. Prepackaged snacks with added sugar. Meats and other proteins Beef. Pork. Lamb. Poultry with skin. Hot dogs. Berniece Salines. Dairy Ice cream. Sour cream. Whole milk. Fats and oils Butter. Canola oil. Vegetable oil. Beef fat (tallow). Lard. Beverages Juice. Sugar-sweetened soft drinks. Beer. Liquor and spirits. Sweets and desserts Cookies. Cakes. Pies. Candy. Seasonings and condiments Mayonnaise. Pre-made sauces and marinades. The items listed above may not be a complete list of foods and beverages you should limit. Contact a dietitian for  more information. Summary The Mediterranean diet includes both food and lifestyle choices. Eat a variety of fresh fruits and vegetables, beans, nuts, seeds, and whole grains. Limit the amount of red meat and sweets that you eat. If recommended by your health care provider, drink red wine in moderation. This means 1 glass a day for nonpregnant women and 2  glasses a day for men. A glass of wine equals 5 oz (150 mL). This information is not intended to replace advice given to you by your health care provider. Make sure you discuss any questions you have with your health care provider. Document Revised: 04/04/2019 Document Reviewed: 01/30/2019 Elsevier Patient Education  [place] Eating Plan DASH stands for Dietary Approaches to Stop Hypertension. The DASH eating plan is a healthy eating plan that has been shown to: Reduce high blood pressure (hypertension). Reduce your risk for type 2 diabetes, heart disease, and stroke. Help with weight loss. What are tips for following this plan? Reading food labels Check food labels for the amount of salt (sodium) per serving. Choose foods with less than 5 percent of the Daily Value of sodium. Generally, foods with less than 300 milligrams (mg) of sodium per serving fit into this eating plan. To find whole grains, look for the word "whole" as the first word in the ingredient list. Shopping Buy products labeled as "low-sodium" or "no salt added." Buy fresh foods. Avoid canned foods and pre-made or frozen meals. Cooking Avoid adding salt when cooking. Use salt-free seasonings or herbs instead of table salt or sea salt. Check with your health care provider or pharmacist before using salt substitutes. Do not fry foods. Cook foods using healthy methods such as baking, boiling, grilling, roasting, and broiling instead. Cook with heart-healthy oils, such as olive, canola, avocado, soybean, or sunflower oil. Meal planning  Eat a balanced diet that includes: 4 or more servings of fruits and 4 or more servings of vegetables each day. Try to fill one-half of your plate with fruits and vegetables. 6-8 servings of whole grains each day. Less than 6 oz (170 g) of lean meat, poultry, or fish each day. A 3-oz (85-g) serving of meat is about the same size as a deck of cards. One egg equals 1 oz (28 g). 2-3  servings of low-fat dairy each day. One serving is 1 cup (237 mL). 1 serving of nuts, seeds, or beans 5 times each week. 2-3 servings of heart-healthy fats. Healthy fats called omega-3 fatty acids are found in foods such as walnuts, flaxseeds, fortified milks, and eggs. These fats are also found in cold-water fish, such as sardines, salmon, and mackerel. Limit how much you eat of: Canned or prepackaged foods. Food that is high in trans fat, such as some fried foods. Food that is high in saturated fat, such as fatty meat. Desserts and other sweets, sugary drinks, and other foods with added sugar. Full-fat dairy products. Do not salt foods before eating. Do not eat more than 4 egg yolks a week. Try to eat at least 2 vegetarian meals a week. Eat more home-cooked food and less restaurant, buffet, and fast food. Lifestyle When eating at a restaurant, ask that your food be prepared with less salt or no salt, if possible. If you drink alcohol: Limit how much you use to: 0-1 drink a day for women who are not pregnant. 0-2 drinks a day for men. Be aware of how much alcohol is in your drink. In the [place] one drink  equals one 12 oz bottle of beer (355 mL), one 5 oz glass of wine (148 mL), or one 1 oz glass of hard liquor (44 mL). General information Avoid eating more than 2,300 mg of salt a day. If you have hypertension, you may need to reduce your sodium intake to 1,500 mg a day. Work with your health care provider to maintain a healthy body weight or to lose weight. Ask what an ideal weight is for you. Get at least 30 minutes of exercise that causes your heart to beat faster (aerobic exercise) most days of the week. Activities may include walking, swimming, or biking. Work with your health care provider or dietitian to adjust your eating plan to your individual calorie needs. What foods should I eat? Fruits All fresh, dried, or frozen fruit. Canned fruit in natural juice (without added  sugar). Vegetables Fresh or frozen vegetables (raw, steamed, roasted, or grilled). Low-sodium or reduced-sodium tomato and vegetable juice. Low-sodium or reduced-sodium tomato sauce and tomato paste. Low-sodium or reduced-sodium canned vegetables. Grains Whole-grain or whole-wheat bread. Whole-grain or whole-wheat pasta. Brown rice. [place]. Bulgur. Whole-grain and low-sodium cereals. Pita bread. Low-fat, low-sodium crackers. Whole-wheat flour tortillas. Meats and other proteins Skinless chicken or [place]. Ground chicken or [place]. Pork with fat trimmed off. Fish and seafood. Egg whites. Dried beans, peas, or lentils. Unsalted nuts, nut butters, and seeds. Unsalted canned beans. Lean cuts of beef with fat trimmed off. Low-sodium, lean precooked or cured meat, such as sausages or meat loaves. Dairy Low-fat (1%) or fat-free (skim) milk. Reduced-fat, low-fat, or fat-free cheeses. Nonfat, low-sodium ricotta or cottage cheese. Low-fat or nonfat yogurt. Low-fat, low-sodium cheese. Fats and oils Soft margarine without trans fats. Vegetable oil. Reduced-fat, low-fat, or light mayonnaise and salad dressings (reduced-sodium). Canola, safflower, olive, avocado, soybean, and sunflower oils. Avocado. Seasonings and condiments Herbs. Spices. Seasoning mixes without salt. Other foods Unsalted popcorn and pretzels. Fat-free sweets. The items listed above may not be a complete list of foods and beverages you can eat. Contact a dietitian for more information. What foods should I avoid? Fruits Canned fruit in a light or heavy syrup. Fried fruit. Fruit in cream or butter sauce. Vegetables Creamed or fried vegetables. Vegetables in a cheese sauce. Regular canned vegetables (not low-sodium or reduced-sodium). Regular canned tomato sauce and paste (not low-sodium or reduced-sodium). Regular tomato and vegetable juice (not low-sodium or reduced-sodium). Angie Fava. Olives. Grains Baked goods made with fat, such  as croissants, muffins, or some breads. Dry pasta or rice meal packs. Meats and other proteins Fatty cuts of meat. Ribs. Fried meat. Berniece Salines. Bologna, salami, and other precooked or cured meats, such as sausages or meat loaves. Fat from the back of a pig (fatback). Bratwurst. Salted nuts and seeds. Canned beans with added salt. Canned or smoked fish. Whole eggs or egg yolks. Chicken or [place] with skin. Dairy Whole or 2% milk, cream, and half-and-half. Whole or full-fat cream cheese. Whole-fat or sweetened yogurt. Full-fat cheese. Nondairy creamers. Whipped toppings. Processed cheese and cheese spreads. Fats and oils Butter. Stick margarine. Lard. Shortening. Ghee. Bacon fat. Tropical oils, such as coconut, palm kernel, or palm oil. Seasonings and condiments Onion salt, garlic salt, seasoned salt, table salt, and sea salt. Worcestershire sauce. Tartar sauce. Barbecue sauce. Teriyaki sauce. Soy sauce, including reduced-sodium. Steak sauce. Canned and packaged gravies. Fish sauce. Oyster sauce. Cocktail sauce. Store-bought horseradish. Ketchup. Mustard. Meat flavorings and tenderizers. Bouillon cubes. Hot sauces. Pre-made or packaged marinades. Pre-made or packaged taco seasonings. Relishes. Regular  salad dressings. Other foods Salted popcorn and pretzels. The items listed above may not be a complete list of foods and beverages you should avoid. Contact a dietitian for more information. Where to find more information National Heart, Lung, and Blood Institute: https://wilson-eaton.com/ American Heart Association: www.heart.org Academy of Nutrition and Dietetics: www.eatright.[place]: www.kidney.org Summary The DASH eating plan is a healthy eating plan that has been shown to reduce high blood pressure (hypertension). It may also reduce your risk for type 2 diabetes, heart disease, and stroke. When on the DASH eating plan, aim to eat more fresh fruits and vegetables, whole grains, lean  proteins, low-fat dairy, and heart-healthy fats. With the DASH eating plan, you should limit salt (sodium) intake to 2,300 mg a day. If you have hypertension, you may need to reduce your sodium intake to 1,500 mg a day. Work with your health care provider or dietitian to adjust your eating plan to your individual calorie needs. This information is not intended to replace advice given to you by your health care provider. Make sure you discuss any questions you have with your health care provider. Document Revised: 01/31/2019 Document Reviewed: 01/31/2019 Elsevier Patient Education  [place].

## 2022-06-14 ENCOUNTER — Telehealth: Payer: Self-pay | Admitting: Family Medicine

## 2022-06-14 DIAGNOSIS — M25512 Pain in left shoulder: Secondary | ICD-10-CM | POA: Diagnosis not present

## 2022-06-14 NOTE — Telephone Encounter (Signed)
Pt called wanting to know if nurse can speak to her about the referral for [facility] nurse?

## 2022-06-14 NOTE — Telephone Encounter (Signed)
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 She said insurance does not normally pay for [facility] to only do that indefinitely

## 2022-06-15 ENCOUNTER — Telehealth: Payer: Self-pay | Admitting: Family Medicine

## 2022-06-15 MED ORDER — HYDROXYZINE HCL 10 MG PO TABS: ORAL_TABLET | ORAL | 0 refills | Status: DC

## 2022-06-15 NOTE — Telephone Encounter (Signed)
Hydroxyzine is prescribed, pls let her know

## 2022-06-15 NOTE — Telephone Encounter (Signed)
Pt called stating that she is itching very bad. States bumps are coming up & going down. Wants something for itching.   Wants to know about nurse to help?

## 2022-06-16 DIAGNOSIS — M542 Cervicalgia: Secondary | ICD-10-CM | POA: Diagnosis not present

## 2022-06-16 DIAGNOSIS — E1122 Type 2 diabetes mellitus with diabetic chronic kidney disease: Secondary | ICD-10-CM | POA: Diagnosis not present

## 2022-06-16 DIAGNOSIS — Z7982 Long term (current) use of aspirin: Secondary | ICD-10-CM | POA: Diagnosis not present

## 2022-06-16 DIAGNOSIS — Z794 Long term (current) use of insulin: Secondary | ICD-10-CM | POA: Diagnosis not present

## 2022-06-16 DIAGNOSIS — M19012 Primary osteoarthritis, left shoulder: Secondary | ICD-10-CM | POA: Diagnosis not present

## 2022-06-16 DIAGNOSIS — I129 Hypertensive chronic kidney disease with stage 1 through stage 4 chronic kidney disease, or unspecified chronic kidney disease: Secondary | ICD-10-CM | POA: Diagnosis not present

## 2022-06-16 DIAGNOSIS — G8911 Acute pain due to trauma: Secondary | ICD-10-CM | POA: Diagnosis not present

## 2022-06-16 DIAGNOSIS — N183 Chronic kidney disease, stage 3 unspecified: Secondary | ICD-10-CM | POA: Diagnosis not present

## 2022-06-16 DIAGNOSIS — G894 Chronic pain syndrome: Secondary | ICD-10-CM | POA: Diagnosis not present

## 2022-06-16 NOTE — Telephone Encounter (Signed)
lvm

## 2022-06-18 NOTE — Progress Notes (Signed)
HPI F Former smoker followed for obstructive sleep apnea with hypersomnia, , R/O'd Lung nodule,Wheezing dyspnea, complicated by hx resection of meningioma. Bipolar, GERD, DM, chronic epistaxis NPSG 3/8/13[name]- mild obstructive sleep apnea, AHI 14 per hour, mostly in REM. Epworth sleepiness score 14/24. Weight 164 pounds. She is living alone, divorced and disabled. Son has OSA. History of meningioma 11/23/2011. No seizure in over 3 years. Does not drive Office Spirometry 40/98/1191-[id number] effort based on appearance of loop and curve. Numbers would fit with severe restriction but her physiologic capability may be better than this. FVC 0.91/44%, FEV1 0.74/45%, FEV1/FVC 0.81, FEF 25-75% 1.43/69%. Office Spirometry 07/27/2015-weak effort but still better than her last previous trial effort. Mild restriction of exhaled volume. FVC 1.35/67%, FEV1 1.27/79%, FEV1/FVC 0.94, FEF 25-75 percent 2.14/107% --------------------------------------------------------------------------------------------------------   03/14/22- 71 yoF Smoker followed for Obstructive Sleep Apnea with hypersomnia, , R/O'd Lung nodule,Wheezing dyspnea/ tobacco, complicated by hx resection of meningioma. 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 Insurance changed her DME from [place] to [employer]  Persistent cough and post-nasal drip. Just finished antibiotic for sinus infection. Discussed most recent CXR with atelectasis-   06/20/22- 71 yoF Smoker followed for Obstructive  Sleep Apnea with hypersomnia, , R/O'd Lung nodule,Bronchitis/ COPD, tobacco, complicated by hx resection of meningioma. Bipolar/ Depression, GERD, DM,HTN,  chronic epistaxis, Covid infection [id number],  CPAP  auto 5-15/ [employer] Download- Compliance 40%, AHI 3.4/ hr Still smoking 5-10 cigs/ day -Neb albuterol, Ventolin hfa, singulair, prednisone 20 mg daily, Stiolto 2.5, Atrovent nasal spray Body weight today-168 lbs ED in March for persistent L calf pain. Negative dopplers. Download reviewed.  She says CPAP helps-she feels much better in the mornings after using it but does not really explain why compliance is not better. [name] her ongoing smoking trying to press her to at least make an effort to quit.  Support offered. The dyspnea on exertion is unchanged.  She denies cough. Pending left shoulder surgery.  ROS-see HPI + = positive Constitutional:   No-   weight loss, night sweats, fevers, chills, +fatigue, lassitude. HEENT:  + headaches, +difficulty swallowing, tooth/dental problems, sore throat,       No-sneezing, itching, ear ache, nasal congestion, post nasal drip,  CV:  chest pain, orthopnea, PND, swelling in lower extremities, anasarca, dizziness, +palpitations Resp: +  shortness of breath with exertion or at rest.                productive cough,   non-productive cough,  No- coughing up of blood.              No-   change in color of mucus.   wheezing.   Skin: No-   rash or lesions. GI:  +heartburn, +indigestion, no-abdominal pain, nausea, vomiting,  GU:  MS:  + joint pain or swelling.   Neuro-     nothing unusual Psych:  No- change in mood or affect. +depression or +anxiety.  No memory loss.  OBJ- Physical Exam - + she speaks slowly but seems alert and appropriate, +obese General-  Oriented,  Affect-flat, Distress- none acute Skin- rash-none, lesions- none, excoriation- none Lymphadenopathy- none Head- Eyes- Gross vision intact, PERRLA, conjunctivae and secretions clear             Ears- Hearing, canals-normal + = positive            Nose- no-Septal dev, polyps, erosion, perforation             Throat- Mallampati III , mucosa clear , drainage- none, tonsils- atrophic,  +hoarse,  + dentures Neck- flexible , trachea midline, no stridor , thyroid nl, carotid no bruit Chest - symmetrical excursion , unlabored           Heart/CV- RRR , no murmur , no gallop  , no rub, nl s1 s2                           - JVD- none , edema- none, stasis changes- none, varices- none           Lung- clear, wheeze- none, cough- mild , dullness-none, rub- none           Chest wall-  Abd-  Br/ Gen/ Rectal- Not done, not indicated Extrem- cyanosis- none, clubbing, none, atrophy- none, strength- nl.   +rolling walker Neuro- + halting speech

## 2022-06-19 IMAGING — MR MR HEAD W/O CM
11 of 12 series · 40 of 48 positions shown · non-contrast
Comparison: Head CT 11/24/2020 and MRI 04/22/2020

CLINICAL DATA: Mental status change, unknown cause. History of
meningioma resection.

EXAM:
MRI HEAD WITHOUT CONTRAST
TECHNIQUE: Multiplanar, multiecho pulse sequences of the brain and surrounding
structures were obtained without intravenous contrast.

[Series 5: DWI · axial · 4.0mm · 0.88mm/px · z∈[-62,+77]mm · 4 of 36 slices shown (1 of 6)]
[im 1/36]
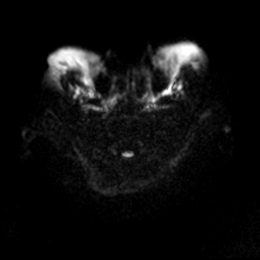
[im 12/36]
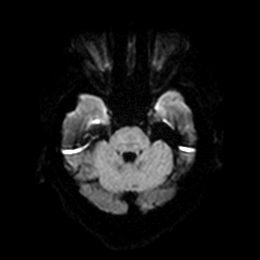
[im 24/36]
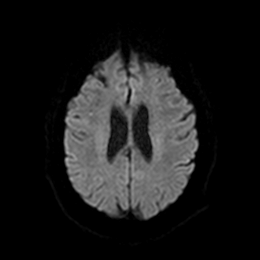
[im 36/36]
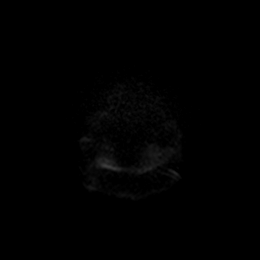

[Series 5: DWI · axial · 4.0mm · 0.88mm/px · z∈[-62,+77]mm · 4 of 36 slices shown (2 of 6)]
[im 1/36]
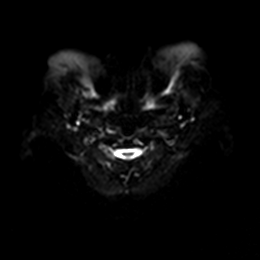
[im 12/36]
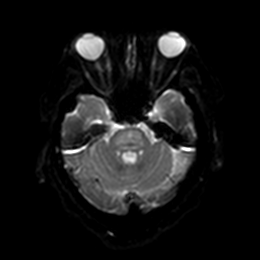
[im 24/36]
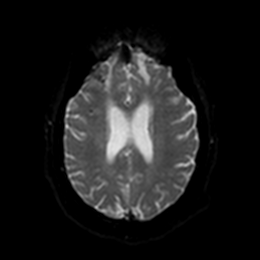
[im 36/36]
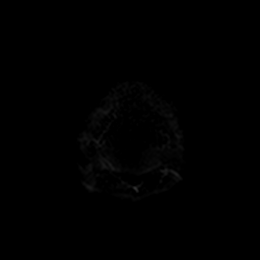

[Series 6: DWI · axial · 4.0mm · 0.88mm/px · z∈[-62,+77]mm · 4 of 36 slices shown (3 of 6)]
[im 1/36]
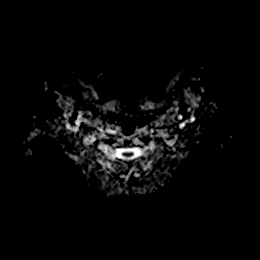
[im 12/36]
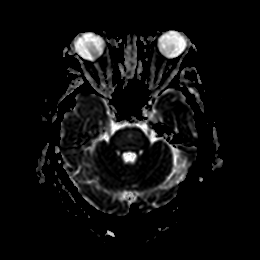
[im 24/36]
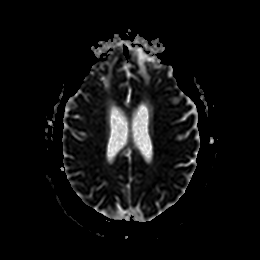
[im 36/36]
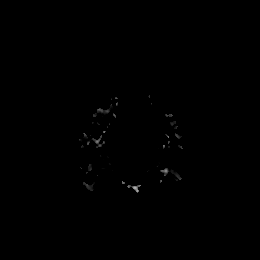

[Series 7: DWI · coronal · 5.0mm · 0.88mm/px · 3 of 28 slices shown (4 of 6)]
[im 1/28]
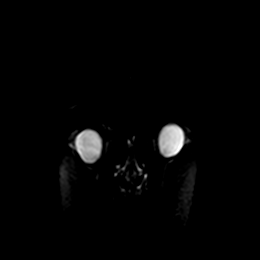
[im 14/28]
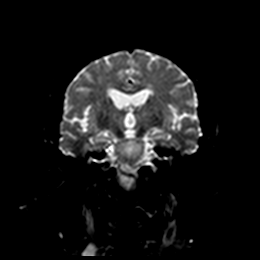
[im 28/28]
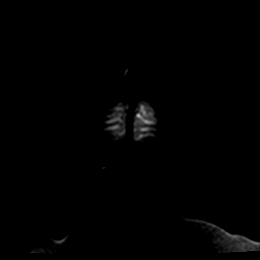

[Series 7: DWI · coronal · 5.0mm · 0.88mm/px · 4 of 28 slices shown (5 of 6)]
[im 1/28]
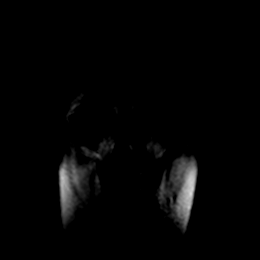
[im 10/28]
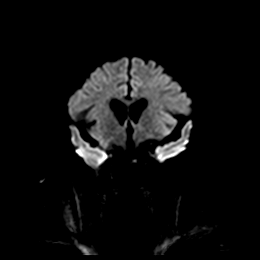
[im 19/28]
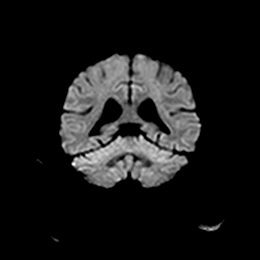
[im 28/28]
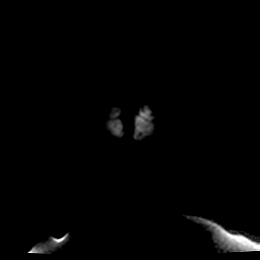

[Series 8: DWI · coronal · 5.0mm · 0.88mm/px · 4 of 28 slices shown (6 of 6)]
[im 1/28]
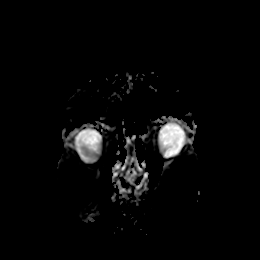
[im 10/28]
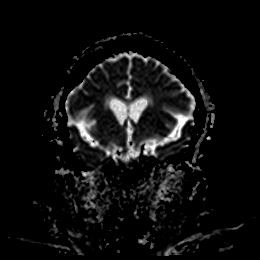
[im 19/28]
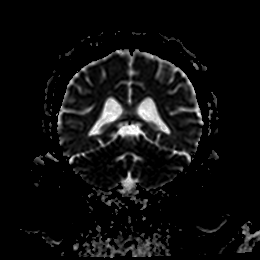
[im 28/28]
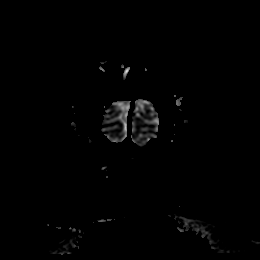

[Series 9: T1 · sagittal · 5.0mm · 0.94mm/px · 3 of 25 slices shown]
[im 1/25]
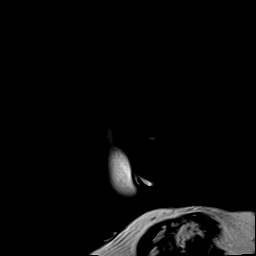
[im 13/25]
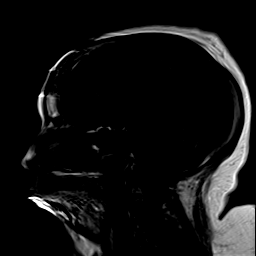
[im 25/25]
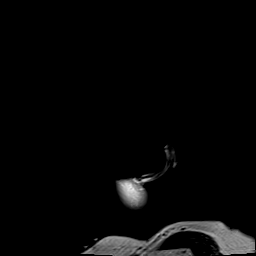

[Series 10: T2 · axial · 5.0mm · 0.72mm/px · z∈[-59,+74]mm · 3 of 20 slices shown (1 of 2)]
[im 1/20]
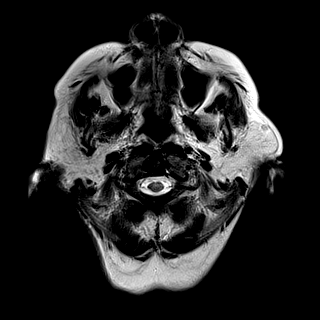
[im 10/20]
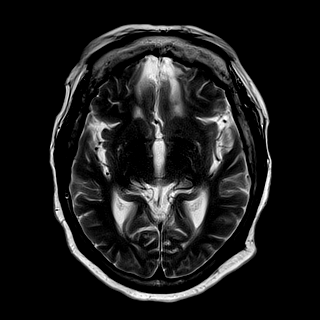
[im 20/20]
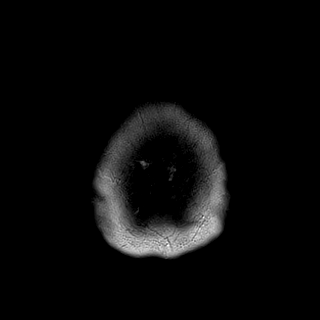

[Series 11: ax hemo · axial · 5.0mm · 0.86mm/px · z∈[-66,+78]mm · 3 of 25 slices shown]
[im 1/25]
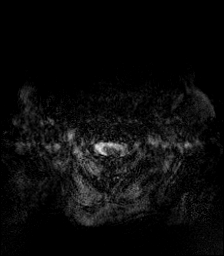
[im 13/25]
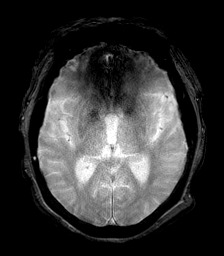
[im 25/25]
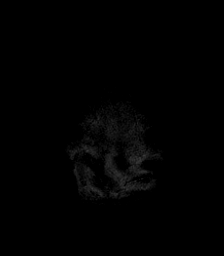

[Series 12: FLAIR · axial · 4.0mm · 0.43mm/px · z∈[-56,+68]mm · 4 of 32 slices shown]
[im 1/32]
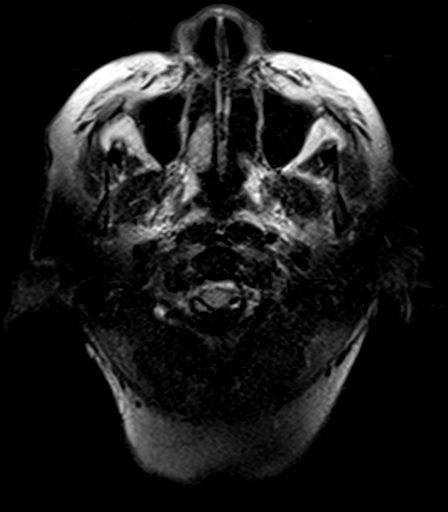
[im 11/32]
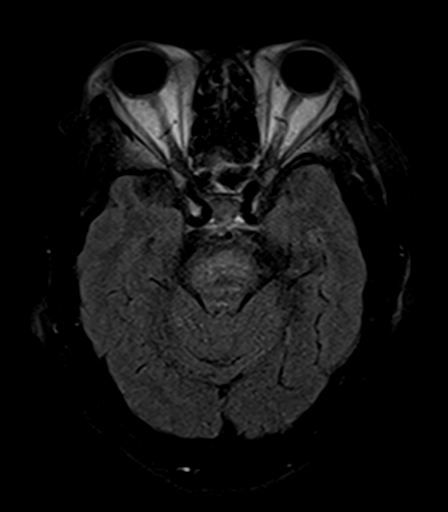
[im 21/32]
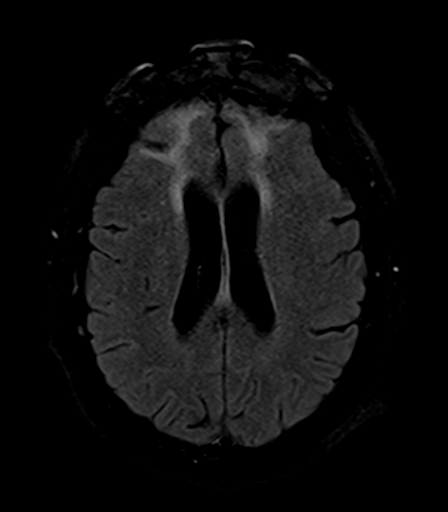
[im 32/32]
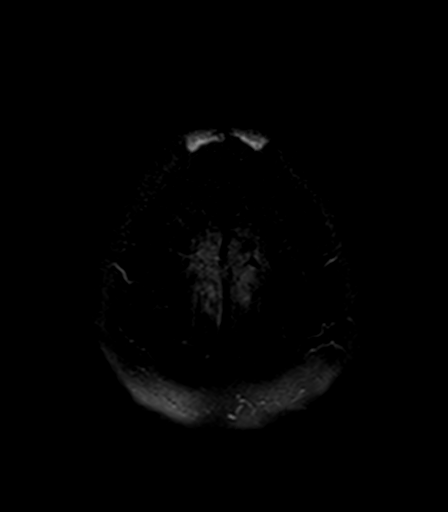

[Series 14: T2 · coronal · 5.0mm · 0.72mm/px · 4 of 28 slices shown (2 of 2)]
[im 1/28]
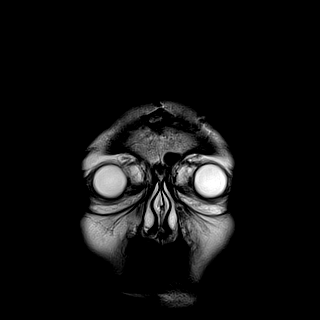
[im 10/28]
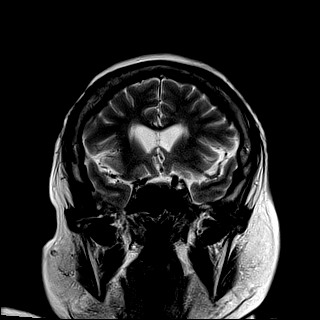
[im 19/28]
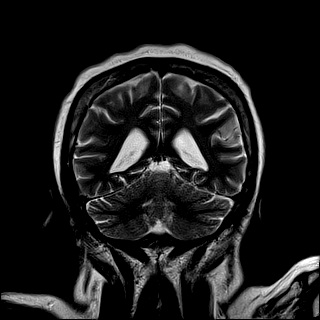
[im 28/28]
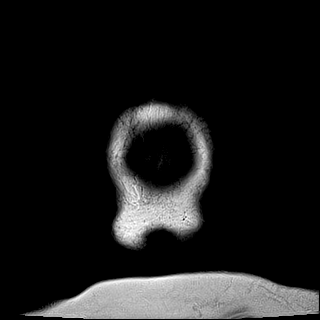

[40 of 48 positions shown; findings below may reference images not displayed]

FINDINGS: Some sequences are up to moderately motion degraded.

Brain: There is no evidence of an acute infarct, midline shift, or
extra-axial fluid collection. The ventricles are normal in size for
age. Postoperative changes from meningioma resection are again noted
with unchanged encephalomalacia and gliosis anteriorly in both
frontal lobes. Assessment of the known small residual meningioma
along the planum sphenoidale is limited by motion artifact and lack
of IV contrast, however this has not grossly enlarged compared to
the prior MRI. Extensive T2 hyperintensity throughout the pons is
unchanged and nonspecific but could be secondary to chronic small
vessel ischemia although only minor chronic small vessel type
changes are present in the supratentorial white matter. Small areas
of chronic hemorrhage in the right frontal lobe are unchanged.

Vascular: Major intracranial vascular flow voids are preserved.

Skull and upper cervical spine: Bifrontal craniotomy. No suspicious
marrow lesion.

Sinuses/Orbits: Bilateral cataract extraction. Paranasal sinuses and
mastoid air cells are clear.

Other: None.
IMPRESSION: 1. No acute intracranial abnormality.
2. Postoperative changes from meningioma resection. Limited
assessment of small residual meningioma along the planum
sphenoidale.

## 2022-06-19 IMAGING — DX DG CHEST 1V PORT
1 series · 1 of 1 positions shown · non-contrast
Comparison: Chest radiograph 02/20/2020 and CT 07/14/2020

CLINICAL DATA: Altered mental status.

EXAM:
PORTABLE CHEST 1 VIEW

[chest ap]
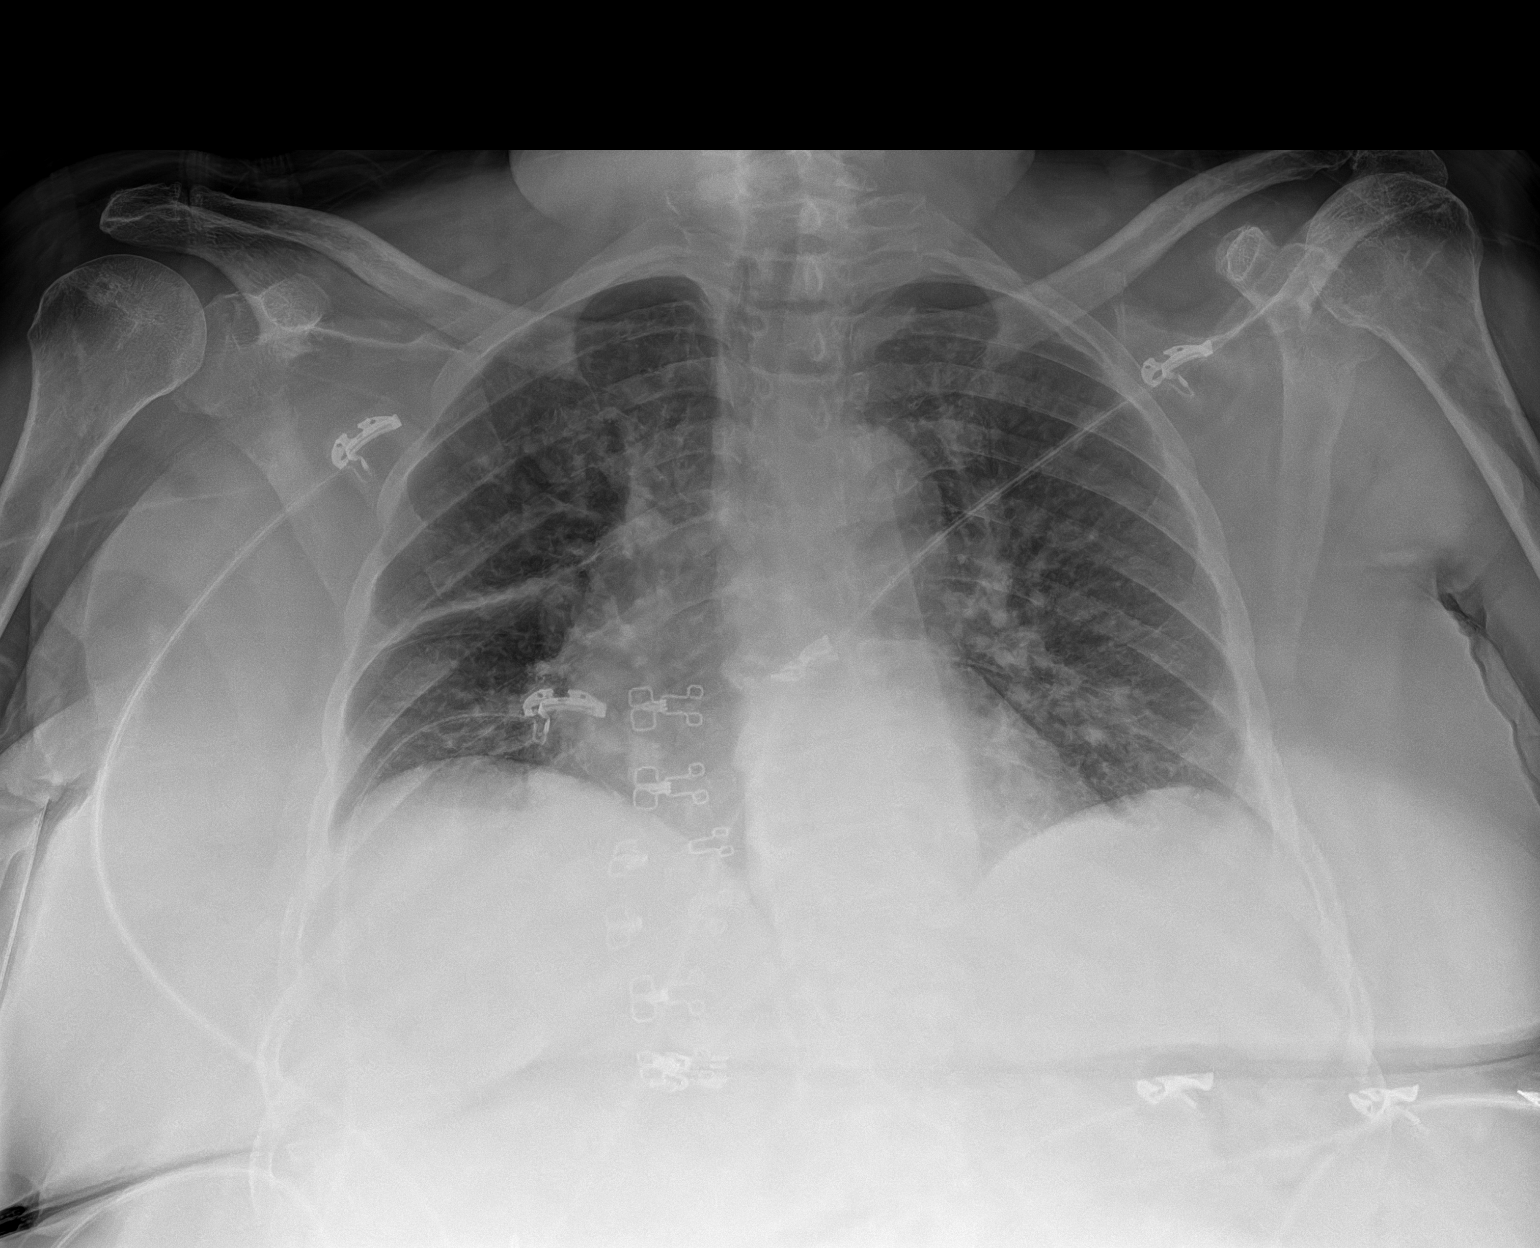

[1 of 1 positions shown; findings below may reference images not displayed]

FINDINGS: The cardiac silhouette is accentuated by rightward patient rotation
and hypoinflation. Lung volumes are lower than on the prior
radiograph with bronchovascular crowding and platelike atelectasis
or scarring in the right mid lung. The interstitial markings are
mildly increased without overt edema. No segmental airspace
consolidation, sizeable pleural effusion, or pneumothorax is
identified. No acute osseous abnormality is seen.
IMPRESSION: Low lung volumes with atelectasis and/or scarring.

## 2022-06-20 ENCOUNTER — Ambulatory Visit (INDEPENDENT_AMBULATORY_CARE_PROVIDER_SITE_OTHER): Payer: Medicare HMO | Admitting: Internal Medicine

## 2022-06-20 ENCOUNTER — Encounter: Payer: Self-pay | Admitting: Internal Medicine

## 2022-06-20 VITALS — BP 140/62 | HR 66 | Ht 59.0 in | Wt 168.4 lb

## 2022-06-20 DIAGNOSIS — J449 Chronic obstructive pulmonary disease, unspecified: Secondary | ICD-10-CM

## 2022-06-20 DIAGNOSIS — Z720 Tobacco use: Secondary | ICD-10-CM

## 2022-06-20 DIAGNOSIS — G4733 Obstructive sleep apnea (adult) (pediatric): Secondary | ICD-10-CM | POA: Diagnosis not present

## 2022-06-20 MED ORDER — NICOTINE 21 MG/24HR TD PT24: 21.0000 mg | MEDICATED_PATCH | Freq: Every day | TRANSDERMAL | 0 refills | Status: DC

## 2022-06-20 MED ORDER — UMECLIDINIUM-VILANTEROL 62.5-25 MCG/ACT IN AEPB: 1.0000 | INHALATION_SPRAY | Freq: Every day | RESPIRATORY_TRACT | 5 refills | Status: DC

## 2022-06-20 NOTE — Patient Instructions (Signed)
Script sent for Anoro inhaler   inhale 1 puff, once daily  Script sent for Nicotine skin patches to help fight the craving for cigarettes.  Please keep trying to use your CPAP all night, every night  You are clear from a pulmonary standpoint to go ahead with shoulder surgery.

## 2022-06-21 ENCOUNTER — Telehealth: Payer: Self-pay

## 2022-06-21 ENCOUNTER — Inpatient Hospital Stay: Payer: Medicare HMO | Attending: Hematology

## 2022-06-21 ENCOUNTER — Inpatient Hospital Stay: Payer: Medicare HMO

## 2022-06-21 VITALS — BP 148/47 | HR 71 | Temp 97.4°F | Resp 16

## 2022-06-21 DIAGNOSIS — Z79899 Other long term (current) drug therapy: Secondary | ICD-10-CM | POA: Insufficient documentation

## 2022-06-21 DIAGNOSIS — D631 Anemia in chronic kidney disease: Secondary | ICD-10-CM

## 2022-06-21 DIAGNOSIS — I129 Hypertensive chronic kidney disease with stage 1 through stage 4 chronic kidney disease, or unspecified chronic kidney disease: Secondary | ICD-10-CM | POA: Diagnosis not present

## 2022-06-21 DIAGNOSIS — D638 Anemia in other chronic diseases classified elsewhere: Secondary | ICD-10-CM

## 2022-06-21 DIAGNOSIS — N1832 Chronic kidney disease, stage 3b: Secondary | ICD-10-CM | POA: Diagnosis not present

## 2022-06-21 LAB — CBC
HCT: 38.3 % (ref 36.0–46.0)
Hemoglobin: 10.7 g/dL — ABNORMAL LOW (ref 12.0–15.0)
MCH: 22.8 pg — ABNORMAL LOW (ref 26.0–34.0)
MCHC: 27.9 g/dL — ABNORMAL LOW (ref 30.0–36.0)
MCV: 81.5 fL (ref 80.0–100.0)
Platelets: 287 10*3/uL (ref 150–400)
RBC: 4.7 MIL/uL (ref 3.87–5.11)
RDW: 15.2 % (ref 11.5–15.5)
WBC: 9.8 10*3/uL (ref 4.0–10.5)
nRBC: 0 % (ref 0.0–0.2)

## 2022-06-21 MED ORDER — EPOETIN ALFA 10000 UNIT/ML IJ SOLN
10000.0000 [IU] | Freq: Once | INTRAMUSCULAR | Status: AC
  Administered 2022-06-21: 10000 [IU] via SUBCUTANEOUS
  Filled 2022-06-21: qty 1

## 2022-06-21 NOTE — Telephone Encounter (Signed)
PA request received via CMM for Anoro Ellipta 62.5-25MCG/ACT aerosol powder  PA has been submitted to [facility] and is pending determination  Key: [id number]

## 2022-06-21 NOTE — Patient Instructions (Signed)
[facility]  Discharge Instructions: Thank you for choosing [facility] to provide your oncology and hematology care.  If you have a lab appointment with the Cancer Center - please note that after April 8th, 2024, all labs will be drawn in the cancer center.  You do not have to check in or register with the main entrance as you have in the past but will complete your check-in in the cancer center.  Wear comfortable clothing and clothing appropriate for easy access to any Portacath or PICC line.   We strive to give you quality time with your provider. You may need to reschedule your appointment if you arrive late (15 or more minutes).  Arriving late affects you and other patients whose appointments are after yours.  Also, if you miss three or more appointments without notifying the office, you may be dismissed from the clinic at the provider's discretion.      For prescription refill requests, have your pharmacy contact our office and allow 72 hours for refills to be completed.    Today you received the following chemotherapy and/or immunotherapy agents Epogen.  Epoetin Alfa Injection What is this medication? EPOETIN ALFA (e POE e tin AL fa) treats low levels of red blood cells (anemia) caused by kidney disease, chemotherapy, or HIV medications. It can also be used in people who are at risk for blood loss during surgery. It works by Systems analyst make more red blood cells, which reduces the need for blood transfusions. This medicine may be used for other purposes; ask your health care provider or pharmacist if you have questions. COMMON BRAND NAME(S): Epogen, Procrit, Retacrit What should I tell my care team before I take this medication? They need to know if you have any of these conditions: Blood clots Cancer Heart disease High blood pressure On dialysis Seizures Stroke An unusual or allergic reaction to epoetin alfa, albumin, benzyl alcohol, other  medications, foods, dyes, or preservatives Pregnant or trying to get pregnant Breast-feeding How should I use this medication? This medication is injected into a vein or under the skin. It is usually given by your care team in a hospital or clinic setting. It may also be given at home. If you get this medication at home, you will be taught how to prepare and give it. Use exactly as directed. Take it as directed on the prescription label at the same time every day. Keep taking it unless your care team tells you to stop. It is important that you put your used needles and syringes in a special sharps container. Do not put them in a trash can. If you do not have a sharps container, call your pharmacist or care team to get one. A special MedGuide will be given to you by the pharmacist with each prescription and refill. Be sure to read this information carefully each time. Talk to your care team about the use of this medication in children. While this medication may be used in children as young as 1 month of age for selected conditions, precautions do apply. Overdosage: If you think you have taken too much of this medicine contact a poison control center or emergency room at once. NOTE: This medicine is only for you. Do not share this medicine with others. What if I miss a dose? If you miss a dose, take it as soon as you can. If it is almost time for your next dose, take only that dose. Do  not take double or extra doses. What may interact with this medication? Darbepoetin alfa Methoxy polyethylene glycol-epoetin beta This list may not describe all possible interactions. Give your health care provider a list of all the medicines, herbs, non-prescription drugs, or dietary supplements you use. Also tell them if you smoke, drink alcohol, or use illegal drugs. Some items may interact with your medicine. What should I watch for while using this medication? Visit your care team for regular checks on your  progress. Check your blood pressure as directed. Know what your blood pressure should be and when to contact your care team. Your condition will be monitored carefully while you are receiving this medication. You may need blood work while taking this medication. What side effects may I notice from receiving this medication? Side effects that you should report to your care team as soon as possible: Allergic reactions--skin rash, itching, hives, swelling of the face, lips, tongue, or throat Blood clot--pain, swelling, or warmth in the leg, shortness of breath, chest pain Heart attack--pain or tightness in the chest, shoulders, arms, or jaw, nausea, shortness of breath, cold or clammy skin, feeling faint or lightheaded Increase in blood pressure Rash, fever, and swollen lymph nodes Redness, blistering, peeling, or loosening of the skin, including inside the mouth Seizures Stroke--sudden numbness or weakness of the face, arm, or leg, trouble speaking, confusion, trouble walking, loss of balance or coordination, dizziness, severe headache, change in vision Side effects that usually do not require medical attention (report to your care team if they continue or are bothersome): Bone, joint, or muscle pain Cough Headache Nausea Pain, redness, or irritation at injection site This list may not describe all possible side effects. Call your doctor for medical advice about side effects. You may report side effects to FDA at 1-800-FDA-1088. Where should I keep my medication? Keep out of the reach of children and pets. Store in a refrigerator. Do not freeze. Do not shake. Protect from light. Keep this medication in the original container until you are ready to take it. See product for storage information. Get rid of any unused medication after the expiration date. To get rid of medications that are no longer needed or have expired: Take the medication to a medication take-back program. Check with your  pharmacy or law enforcement to find a location. If you cannot return the medication, ask your pharmacist or care team how to get rid of the medication safely. NOTE: This sheet is a summary. It may not cover all possible information. If you have questions about this medicine, talk to your doctor, pharmacist, or health care provider.  2023 Elsevier/Gold Standard (2021-06-07 00:00:00)       To help prevent nausea and vomiting after your treatment, we encourage you to take your nausea medication as directed.  BELOW ARE SYMPTOMS THAT SHOULD BE REPORTED IMMEDIATELY: *FEVER GREATER THAN 100.4 F (38 C) OR HIGHER *CHILLS OR SWEATING *NAUSEA AND VOMITING THAT IS NOT CONTROLLED WITH YOUR NAUSEA MEDICATION *UNUSUAL SHORTNESS OF BREATH *UNUSUAL BRUISING OR BLEEDING *URINARY PROBLEMS (pain or burning when urinating, or frequent urination) *BOWEL PROBLEMS (unusual diarrhea, constipation, pain near the anus) TENDERNESS IN MOUTH AND THROAT WITH OR WITHOUT PRESENCE OF ULCERS (sore throat, sores in mouth, or a toothache) UNUSUAL RASH, SWELLING OR PAIN  UNUSUAL VAGINAL DISCHARGE OR ITCHING   Items with * indicate a potential emergency and should be followed up as soon as possible or go to the Emergency Department if any problems should occur.  Please show  the CHEMOTHERAPY ALERT CARD or IMMUNOTHERAPY ALERT CARD at check-in to the Emergency Department and triage nurse.  Should you have questions after your visit or need to cancel or reschedule your appointment, please [facility] [phone]  and follow the prompts.  Office hours are 8:00 a.m. to 4:30 p.m. Monday - Friday. Please note that voicemails left after 4:00 p.m. may not be returned until the following business day.  We are closed weekends and major holidays. You have access to a nurse at all times for urgent questions. Please call the main number to the clinic [phone] and follow the prompts.  For any non-urgent  questions, you may also contact your provider using MyChart. We now offer e-Visits for anyone 4418 and older to request care online for non-urgent symptoms. For details visit [email or web address].   Also download the MyChart app! Go to the app store, search "MyChart", open the app, select [facility], and log in with your MyChart username and password.

## 2022-06-21 NOTE — Progress Notes (Signed)
[name] presents today for injection per the provider's orders.  Epogen administration without incident; injection site WNL; see MAR for injection details.  Patient tolerated procedure well and without incident.  Discharged from clinic ambulatory in stable condition. Alert and oriented x 3. F/U with [facility] as scheduled.

## 2022-06-21 NOTE — Telephone Encounter (Signed)
PA has been DENIED due to:   You asked for the drug above for your acute bronchitis. This is an off-label use that is not medically accepted. The Medicare rule in the Prescription Drug Benefit Manual (Chapter 6, Section 10.6) says a drug must be used for a medically accepted indication (covered use). Off label use is medically accepted when there is proof in one or more of the drug guides that the drug works for your condition. We look at the two major drug guides (compendia): the DRUGDEX Information System and the [facility] Formulary Service Drug Information (AHFS-DI). [employer] has decided that there is no proof in either drug guide that this drug works for your condition. Per Medicare rules, it is not covered.

## 2022-06-23 ENCOUNTER — Inpatient Hospital Stay: Payer: Medicare HMO

## 2022-06-23 ENCOUNTER — Telehealth: Payer: Self-pay | Admitting: Family Medicine

## 2022-06-23 VITALS — BP 143/61 | HR 68 | Temp 97.8°F | Resp 18

## 2022-06-23 DIAGNOSIS — D631 Anemia in chronic kidney disease: Secondary | ICD-10-CM | POA: Diagnosis not present

## 2022-06-23 DIAGNOSIS — I129 Hypertensive chronic kidney disease with stage 1 through stage 4 chronic kidney disease, or unspecified chronic kidney disease: Secondary | ICD-10-CM | POA: Diagnosis not present

## 2022-06-23 DIAGNOSIS — Z794 Long term (current) use of insulin: Secondary | ICD-10-CM

## 2022-06-23 DIAGNOSIS — M19012 Primary osteoarthritis, left shoulder: Secondary | ICD-10-CM

## 2022-06-23 DIAGNOSIS — N183 Chronic kidney disease, stage 3 unspecified: Secondary | ICD-10-CM | POA: Diagnosis not present

## 2022-06-23 DIAGNOSIS — G894 Chronic pain syndrome: Secondary | ICD-10-CM

## 2022-06-23 DIAGNOSIS — Z7982 Long term (current) use of aspirin: Secondary | ICD-10-CM

## 2022-06-23 DIAGNOSIS — N1832 Chronic kidney disease, stage 3b: Secondary | ICD-10-CM

## 2022-06-23 DIAGNOSIS — M542 Cervicalgia: Secondary | ICD-10-CM

## 2022-06-23 DIAGNOSIS — G8911 Acute pain due to trauma: Secondary | ICD-10-CM | POA: Diagnosis not present

## 2022-06-23 DIAGNOSIS — E1122 Type 2 diabetes mellitus with diabetic chronic kidney disease: Secondary | ICD-10-CM | POA: Diagnosis not present

## 2022-06-23 DIAGNOSIS — D638 Anemia in other chronic diseases classified elsewhere: Secondary | ICD-10-CM

## 2022-06-23 DIAGNOSIS — Z79899 Other long term (current) drug therapy: Secondary | ICD-10-CM | POA: Diagnosis not present

## 2022-06-23 DIAGNOSIS — Z9181 History of falling: Secondary | ICD-10-CM

## 2022-06-23 MED ORDER — CETIRIZINE HCL 10 MG PO TABS
10.0000 mg | ORAL_TABLET | Freq: Once | ORAL | Status: AC
  Administered 2022-06-23: 10 mg via ORAL
  Filled 2022-06-23: qty 1

## 2022-06-23 MED ORDER — FAMOTIDINE IN NACL 20-0.9 MG/50ML-% IV SOLN
20.0000 mg | Freq: Once | INTRAVENOUS | Status: AC
  Administered 2022-06-23: 20 mg via INTRAVENOUS
  Filled 2022-06-23: qty 50

## 2022-06-23 MED ORDER — SODIUM CHLORIDE 0.9 % IV SOLN: Freq: Once | INTRAVENOUS | Status: AC

## 2022-06-23 MED ORDER — METHYLPREDNISOLONE SODIUM SUCC 125 MG IJ SOLR
125.0000 mg | Freq: Once | INTRAMUSCULAR | Status: AC
  Administered 2022-06-23: 125 mg via INTRAVENOUS
  Filled 2022-06-23: qty 2

## 2022-06-23 MED ORDER — SODIUM CHLORIDE 0.9 % IV SOLN
500.0000 mg | Freq: Once | INTRAVENOUS | Status: AC
  Administered 2022-06-23: 500 mg via INTRAVENOUS
  Filled 2022-06-23: qty 20

## 2022-06-23 MED ORDER — ACETAMINOPHEN 325 MG PO TABS
650.0000 mg | ORAL_TABLET | Freq: Once | ORAL | Status: AC
  Administered 2022-06-23: 650 mg via ORAL
  Filled 2022-06-23: qty 2

## 2022-06-23 NOTE — Telephone Encounter (Signed)
New message    The patient checking to see if [facility] sent over cardiac clearance to be competed   Surgery is pending.

## 2022-06-23 NOTE — Progress Notes (Signed)
Venofer infusion given per orders. Patient tolerated it well without problems. Vitals stable and discharged home from clinic ambulatory. Follow up as scheduled.  

## 2022-06-23 NOTE — Telephone Encounter (Signed)
I haven't seen anything- they may need to refax it

## 2022-06-23 NOTE — Progress Notes (Signed)
Patient presents today for Venofer infusion per providers order.  Vital signs WNL.  Patient has no new complaints at this time.  Peripheral IV started and blood return noted pre and  

## 2022-06-23 NOTE — Patient Instructions (Signed)
[facility]  Discharge Instructions: Thank you for choosing [facility] to provide your oncology and hematology care.  If you have a lab appointment with the Cancer Center - please note that after April 8th, 2024, all labs will be drawn in the cancer center.  You do not have to check in or register with the main entrance as you have in the past but will complete your check-in in the cancer center.  Wear comfortable clothing and clothing appropriate for easy access to any Portacath or PICC line.   We strive to give you quality time with your provider. You may need to reschedule your appointment if you arrive late (15 or more minutes).  Arriving late affects you and other patients whose appointments are after yours.  Also, if you miss three or more appointments without notifying the office, you may be dismissed from the clinic at the provider's discretion.      For prescription refill requests, have your pharmacy contact our office and allow 72 hours for refills to be completed.    Today you received the following iron infusion   To help prevent nausea and vomiting after your treatment, we encourage you to take your nausea medication as directed.  BELOW ARE SYMPTOMS THAT SHOULD BE REPORTED IMMEDIATELY: *FEVER GREATER THAN 100.4 F (38 C) OR HIGHER *CHILLS OR SWEATING *NAUSEA AND VOMITING THAT IS NOT CONTROLLED WITH YOUR NAUSEA MEDICATION *UNUSUAL SHORTNESS OF BREATH *UNUSUAL BRUISING OR BLEEDING *URINARY PROBLEMS (pain or burning when urinating, or frequent urination) *BOWEL PROBLEMS (unusual diarrhea, constipation, pain near the anus) TENDERNESS IN MOUTH AND THROAT WITH OR WITHOUT PRESENCE OF ULCERS (sore throat, sores in mouth, or a toothache) UNUSUAL RASH, SWELLING OR PAIN  UNUSUAL VAGINAL DISCHARGE OR ITCHING   Items with * indicate a potential emergency and should be followed up as soon as possible or go to the Emergency Department if any problems  should occur.  Please show the CHEMOTHERAPY ALERT CARD or IMMUNOTHERAPY ALERT CARD at check-in to the Emergency Department and triage nurse.  Should you have questions after your visit or need to cancel or reschedule your appointment, please contact [facility] [phone]  and follow the prompts.  Office hours are 8:00 a.m. to 4:30 p.m. Monday - Friday. Please note that voicemails left after 4:00 p.m. may not be returned until the following business day.  We are closed weekends and major holidays. You have access to a nurse at all times for urgent questions. Please call the main number to the clinic [phone] and follow the prompts.  For any non-urgent questions, you may also contact your provider using MyChart. We now offer e-Visits for anyone 56 and older to request care online for non-urgent symptoms. For details visit [email or web address].   Also download the MyChart app! Go to the app store, search "MyChart", open the app, select [facility], and log in with your MyChart username and password.

## 2022-06-26 DIAGNOSIS — M542 Cervicalgia: Secondary | ICD-10-CM | POA: Diagnosis not present

## 2022-06-26 DIAGNOSIS — E1122 Type 2 diabetes mellitus with diabetic chronic kidney disease: Secondary | ICD-10-CM | POA: Diagnosis not present

## 2022-06-26 DIAGNOSIS — M19012 Primary osteoarthritis, left shoulder: Secondary | ICD-10-CM | POA: Diagnosis not present

## 2022-06-26 DIAGNOSIS — Z7982 Long term (current) use of aspirin: Secondary | ICD-10-CM | POA: Diagnosis not present

## 2022-06-26 DIAGNOSIS — N183 Chronic kidney disease, stage 3 unspecified: Secondary | ICD-10-CM | POA: Diagnosis not present

## 2022-06-26 DIAGNOSIS — G8911 Acute pain due to trauma: Secondary | ICD-10-CM | POA: Diagnosis not present

## 2022-06-26 DIAGNOSIS — Z794 Long term (current) use of insulin: Secondary | ICD-10-CM | POA: Diagnosis not present

## 2022-06-26 DIAGNOSIS — I129 Hypertensive chronic kidney disease with stage 1 through stage 4 chronic kidney disease, or unspecified chronic kidney disease: Secondary | ICD-10-CM | POA: Diagnosis not present

## 2022-06-26 DIAGNOSIS — G894 Chronic pain syndrome: Secondary | ICD-10-CM | POA: Diagnosis not present

## 2022-06-27 ENCOUNTER — Telehealth: Payer: Self-pay | Admitting: Family Medicine

## 2022-06-27 NOTE — Telephone Encounter (Signed)
Requested Medical Papers. Please call pt.

## 2022-06-28 ENCOUNTER — Ambulatory Visit (INDEPENDENT_AMBULATORY_CARE_PROVIDER_SITE_OTHER): Payer: Medicare HMO | Admitting: Family Medicine

## 2022-06-28 VITALS — BP 140/70 | HR 64 | Resp 16 | Ht 59.0 in | Wt 168.0 lb

## 2022-06-28 DIAGNOSIS — E039 Hypothyroidism, unspecified: Secondary | ICD-10-CM | POA: Diagnosis not present

## 2022-06-28 DIAGNOSIS — Z01818 Encounter for other preprocedural examination: Secondary | ICD-10-CM | POA: Diagnosis not present

## 2022-06-28 DIAGNOSIS — F17218 Nicotine dependence, cigarettes, with other nicotine-induced disorders: Secondary | ICD-10-CM

## 2022-06-28 DIAGNOSIS — E782 Mixed hyperlipidemia: Secondary | ICD-10-CM

## 2022-06-28 DIAGNOSIS — I10 Essential (primary) hypertension: Secondary | ICD-10-CM

## 2022-06-28 DIAGNOSIS — L299 Pruritus, unspecified: Secondary | ICD-10-CM

## 2022-06-28 DIAGNOSIS — E1142 Type 2 diabetes mellitus with diabetic polyneuropathy: Secondary | ICD-10-CM

## 2022-06-28 DIAGNOSIS — E559 Vitamin D deficiency, unspecified: Secondary | ICD-10-CM | POA: Diagnosis not present

## 2022-06-28 DIAGNOSIS — L918 Other hypertrophic disorders of the skin: Secondary | ICD-10-CM

## 2022-06-28 DIAGNOSIS — M7542 Impingement syndrome of left shoulder: Secondary | ICD-10-CM

## 2022-06-28 DIAGNOSIS — Z720 Tobacco use: Secondary | ICD-10-CM

## 2022-06-28 DIAGNOSIS — G4733 Obstructive sleep apnea (adult) (pediatric): Secondary | ICD-10-CM | POA: Diagnosis not present

## 2022-06-28 DIAGNOSIS — N1831 Chronic kidney disease, stage 3a: Secondary | ICD-10-CM | POA: Diagnosis not present

## 2022-06-28 MED ORDER — CLOBETASOL PROPIONATE 0.05 % EX SOLN: 1.0000 | Freq: Two times a day (BID) | CUTANEOUS | 1 refills | Status: DC

## 2022-06-28 NOTE — Patient Instructions (Addendum)
F/U in 3 months, call if you need me sooner  Lipid, cmp and EGFr, TSH and Vit D and CCUA in am for preop  Skin tag on neck can be removed by Dermatology at a later date  Clobetasol is sent in   Please order trapeze bar Nurse  Please get TdAP asap  Please stop smoking and use the patches  Thanks for choosing [facility], we consider it a privelige to serve you.

## 2022-06-28 NOTE — Telephone Encounter (Signed)
Pt called back and scheduled in office appt for today

## 2022-06-28 NOTE — Progress Notes (Signed)
[name]     MRN: [id number]      DOB: [birth year]   HPI [name] is here for follow up and re-evaluation of chronic medical conditions, medication management and review of any available recent lab and radiology data.  Preventive health is updated, specifically  Cancer screening and Immunization.   C/o itching in her scalp, mole on left neck C/o poor balance with recurrent falls Has left shoulder upcoming and paperwork requesting Nephrology and general clearance Requests trapeze bar to assist her her when getting out of bed, currently sleeping in recliner chair because of inability o get out of bed ROS Denies recent fever or chills. Denies sinus pressure, nasal congestion, ear pain or sore throat. Denies chest congestion, productive cough or wheezing. Denies chest pains, palpitations and leg swelling Denies abdominal pain, nausea, vomiting,diarrhea or constipation.   Denies dysuria, frequency, hesitancy  C/o severe left shoulder pain with reduced mobility and has chronic back painDenies headaches, seizures,  c/o lower extremity numbness, and  tingling. chronic depression, anxiety and  insomnia.  PE  BP (!) 140/70   Pulse 64   Resp 16   Ht 4\' 11"  (1.499 m)   Wt 168 lb (76.2 kg)   SpO2 94%   BMI 33.93 kg/m   Patient alert and oriented and in no cardiopulmonary distress.  HEENT: No facial asymmetry, EOMI,     Neck decreased ROM  Chest: Clear to auscultation bilaterally.  CVS: S1, S2 no murmurs, no S3.Regular rate.  ABD: Soft non tender.   Ext: No edema  MS: decreased  ROM spine, and left shoulders,.  Skin: Intact, skin tag present on left anterior neck  Psych: Good eye contact, normal affect. Memory intact not anxious or depressed appearing.  CNS: CN 2-12 intact, power,  normal throughout.no focal deficits noted.   Assessment & Plan  Essential hypertension DASH diet and commitment to daily physical activity for a minimum of 30 minutes discussed and encouraged,  as a part of hypertension management. The importance of attaining a healthy weight is also discussed.     06/28/2022   12:04 PM 06/28/2022   11:24 AM 06/23/2022    1:20 PM 06/23/2022    8:00 AM 06/21/2022   11:17 AM 06/20/2022    3:16 PM 06/12/2022    3:11 PM  BP/Weight  Systolic BP 140 159 143 146 148 140 138  Diastolic BP 70 73 61 59 47 62 62  Wt. (Lbs)  168    168.4   BMI  33.93 kg/m2    34.01 kg/m2      Adequate control, h/o recurrent falls no med adjustment  DMII (diabetes mellitus, type 2) (HCC) [name] is reminded of the importance of commitment to daily physical activity for 30 minutes or more, as able and the need to limit carbohydrate intake to 30 to 60 grams per meal to help with blood sugar control.   The need to take medication as prescribed, test blood sugar as directed, and to call between visits if there is a concern that blood sugar is uncontrolled is also discussed.   [name] is reminded of the importance of daily foot exam, annual eye examination, and good blood sugar, blood pressure and cholesterol control. Managed by Endo,      Latest Ref Rng & Units 06/29/2022   11:50 AM 05/12/2022   10:21 AM 04/20/2022    3:00 PM 03/09/2022    2:18 PM 01/27/2022    9:36 AM  Diabetic  Labs  HbA1c 4.0 - 5.6 %   5.5     Micro/Creat Ratio 0 - 29 mg/g creat    41    Chol 100 - 199 mg/dL 161       HDL >09 mg/dL 54       Calc LDL 0 - 99 mg/dL 53       Triglycerides 0 - 149 mg/dL 81       Creatinine 6.04 - 1.00 mg/dL 5.40  9.81    1.91       06/28/2022   12:04 PM 06/28/2022   11:24 AM 06/23/2022    1:20 PM 06/23/2022    8:00 AM 06/21/2022   11:17 AM 06/20/2022    3:16 PM 06/12/2022    3:11 PM  BP/Weight  Systolic BP 140 159 143 146 148 140 138  Diastolic BP 70 73 61 59 47 62 62  Wt. (Lbs)  168    168.4   BMI  33.93 kg/m2    34.01 kg/m2       Latest Ref Rng & Units 05/26/2020   12:00 AM 01/01/2019    1:40 PM  Foot/eye exam completion dates  Eye Exam No Retinopathy No  Retinopathy       Foot Form Completion   Done     This result is from an external source.        Tobacco user Asked:confirms currently smokes cigarettes Assess: Unwilling to set a quit date, but is cutting back Advise: needs to QUIT to reduce risk of cancer, cardio and cerebrovascular disease Assist: counseled for 5 minutes and literature provided Arrange: follow up in 2 to 4 months Advised to use nicoderm patches in place of cigarettes esp with proposed upcoming surgery  Obstructive sleep apnea Followed and managed by Pulmonary, compliance with treatment encouraged  Acquired hypothyroidism Controlled, no change in medication   Diabetic polyneuropathy associated with type 2 diabetes mellitus (HCC) [name] is reminded of the importance of commitment to daily physical activity for 30 minutes or more, as able and the need to limit carbohydrate intake to 30 to 60 grams per meal to help with blood sugar control.   The need to take medication as prescribed, test blood sugar as directed, and to call between visits if there is a concern that blood sugar is uncontrolled is also discussed.   [name] is reminded of the importance of daily foot exam, annual eye examination, and good blood sugar, blood pressure and cholesterol control.     Latest Ref Rng & Units 06/29/2022   11:50 AM 05/12/2022   10:21 AM 04/20/2022    3:00 PM 03/09/2022    2:18 PM 01/27/2022    9:36 AM  Diabetic Labs  HbA1c 4.0 - 5.6 %   5.5     Micro/Creat Ratio 0 - 29 mg/g creat    41    Chol 100 - 199 mg/dL 478       HDL >29 mg/dL 54       Calc LDL 0 - 99 mg/dL 53       Triglycerides 0 - 149 mg/dL 81       Creatinine 5.62 - 1.00 mg/dL 1.30  8.65    7.84       06/28/2022   12:04 PM 06/28/2022   11:24 AM 06/23/2022    1:20 PM 06/23/2022    8:00 AM 06/21/2022   11:17 AM 06/20/2022    3:16 PM 06/12/2022    3:11 PM  BP/Weight  Systolic BP 140 159 143 146 148 140 138  Diastolic BP 70 73 61 59 47 62 62  Wt. (Lbs)  168     168.4   BMI  33.93 kg/m2    34.01 kg/m2       Latest Ref Rng & Units 05/26/2020   12:00 AM 01/01/2019    1:40 PM  Foot/eye exam completion dates  Eye Exam No Retinopathy No Retinopathy       Foot Form Completion   Done     This result is from an external source.      Managed by Endo has had hypoglycemia and hBa1C is in normal range, advised to eat on a regular schedule  CKD (chronic kidney disease) stage 3, GFR 30-59 ml/min Refer Nephrology for E/M s well as pre op clearance for upcoming left shoulder surgery  Skin tag Skin tag of left neck, will refer to Dermatology for future appt  Nicotine dependence Asked:confirms currently smokes cigarettes Assess: Unwilling to set a quit date, but is cutting back, has recently been prescribed patches , needs to be nicotine free prior to upcoming surgery  Advise: needs to QUIT to reduce risk of cancer, cardio and cerebrovascular disease Assist: counseled for 5 minutes and literature provided Arrange: follow up in 2 to 4 months   Itchy scalp No visible lesions, topical steroid prescribed, clobetasol  Pruritus Associated with anxiety continue med management by Psychiatry  Impingement syndrome of left shoulder region Has surgery planned, needs pre op clearance , requested by ortho for nephrology and I am referring her for Cardiology eval as well  Trapeze bar to be ordered to assist with getting in and out of bed   Preop examination History and exam as documented Cardiology and Nephrology consults requested . CCUA to be collected

## 2022-06-29 DIAGNOSIS — E559 Vitamin D deficiency, unspecified: Secondary | ICD-10-CM | POA: Diagnosis not present

## 2022-06-29 DIAGNOSIS — I10 Essential (primary) hypertension: Secondary | ICD-10-CM | POA: Diagnosis not present

## 2022-06-29 DIAGNOSIS — E782 Mixed hyperlipidemia: Secondary | ICD-10-CM | POA: Diagnosis not present

## 2022-06-30 DIAGNOSIS — G8911 Acute pain due to trauma: Secondary | ICD-10-CM | POA: Diagnosis not present

## 2022-06-30 DIAGNOSIS — G894 Chronic pain syndrome: Secondary | ICD-10-CM | POA: Diagnosis not present

## 2022-06-30 DIAGNOSIS — I129 Hypertensive chronic kidney disease with stage 1 through stage 4 chronic kidney disease, or unspecified chronic kidney disease: Secondary | ICD-10-CM | POA: Diagnosis not present

## 2022-06-30 DIAGNOSIS — M542 Cervicalgia: Secondary | ICD-10-CM | POA: Diagnosis not present

## 2022-06-30 DIAGNOSIS — M19012 Primary osteoarthritis, left shoulder: Secondary | ICD-10-CM | POA: Diagnosis not present

## 2022-06-30 DIAGNOSIS — Z7982 Long term (current) use of aspirin: Secondary | ICD-10-CM | POA: Diagnosis not present

## 2022-06-30 DIAGNOSIS — Z794 Long term (current) use of insulin: Secondary | ICD-10-CM | POA: Diagnosis not present

## 2022-06-30 DIAGNOSIS — E1122 Type 2 diabetes mellitus with diabetic chronic kidney disease: Secondary | ICD-10-CM | POA: Diagnosis not present

## 2022-06-30 DIAGNOSIS — N183 Chronic kidney disease, stage 3 unspecified: Secondary | ICD-10-CM | POA: Diagnosis not present

## 2022-06-30 LAB — CBC WITH DIFFERENTIAL/PLATELET
EOS (ABSOLUTE): 0.2 10*3/uL (ref 0.0–0.4)
Eos: 2 %
MCH: 22.6 pg — ABNORMAL LOW (ref 26.6–33.0)
Monocytes: 9 %
RBC: 4.86 x10E6/uL (ref 3.77–5.28)
WBC: 9.8 10*3/uL (ref 3.4–10.8)

## 2022-06-30 LAB — CMP14+EGFR

## 2022-06-30 LAB — LIPID PANEL

## 2022-07-01 ENCOUNTER — Encounter: Payer: Self-pay | Admitting: Internal Medicine

## 2022-07-01 LAB — CBC WITH DIFFERENTIAL/PLATELET
Basophils Absolute: 0.1 10*3/uL (ref 0.0–0.2)
MCHC: 28.4 g/dL — ABNORMAL LOW (ref 31.5–35.7)
Neutrophils Absolute: 6.3 10*3/uL (ref 1.4–7.0)
Neutrophils: 64 %
RDW: 14.4 % (ref 11.7–15.4)

## 2022-07-01 LAB — LIPID PANEL
Chol/HDL Ratio: 2.3 ratio (ref 0.0–4.4)
Cholesterol, Total: 123 mg/dL (ref 100–199)
HDL: 54 mg/dL (ref >39)
VLDL Cholesterol Cal: 16 mg/dL (ref 5–40)

## 2022-07-01 LAB — CMP14+EGFR: Total Protein: 6.6 g/dL (ref 6.0–8.5)

## 2022-07-02 LAB — CMP14+EGFR
ALT: 20 IU/L (ref 0–32)
AST: 21 IU/L (ref 0–40)
Albumin/Globulin Ratio: 1.8 (ref 1.2–2.2)
Albumin: 4.2 g/dL (ref 3.8–4.8)
Alkaline Phosphatase: 85 IU/L (ref 44–121)
BUN/Creatinine Ratio: 24 (ref 12–28)
CO2: 19 mmol/L — ABNORMAL LOW (ref 20–29)
Chloride: 108 mmol/L — ABNORMAL HIGH (ref 96–106)
Creatinine, Ser: 1.18 mg/dL — ABNORMAL HIGH (ref 0.57–1.00)
Globulin, Total: 2.4 g/dL (ref 1.5–4.5)
Potassium: 4.2 mmol/L (ref 3.5–5.2)

## 2022-07-02 LAB — CBC WITH DIFFERENTIAL/PLATELET
Basos: 1 %
Hematocrit: 38.8 % (ref 34.0–46.6)
Hemoglobin: 11 g/dL — ABNORMAL LOW (ref 11.1–15.9)
Immature Grans (Abs): 0.1 10*3/uL (ref 0.0–0.1)
Immature Granulocytes: 1 %
Lymphocytes Absolute: 2.3 10*3/uL (ref 0.7–3.1)
Lymphs: 23 %
MCV: 80 fL (ref 79–97)
Monocytes Absolute: 0.9 10*3/uL (ref 0.1–0.9)
Platelets: 279 10*3/uL (ref 150–450)

## 2022-07-02 LAB — LIPID PANEL: Triglycerides: 81 mg/dL (ref 0–149)

## 2022-07-02 LAB — TSH: TSH: 1.06 u[IU]/mL (ref 0.450–4.500)

## 2022-07-02 LAB — VITAMIN D 25 HYDROXY (VIT D DEFICIENCY, FRACTURES): Vit D, 25-Hydroxy: 48.4 ng/mL (ref 30.0–100.0)

## 2022-07-03 ENCOUNTER — Telehealth: Payer: Self-pay | Admitting: Family Medicine

## 2022-07-03 ENCOUNTER — Encounter: Payer: Self-pay | Admitting: Family Medicine

## 2022-07-03 DIAGNOSIS — L918 Other hypertrophic disorders of the skin: Secondary | ICD-10-CM | POA: Insufficient documentation

## 2022-07-03 DIAGNOSIS — N183 Chronic kidney disease, stage 3 unspecified: Secondary | ICD-10-CM | POA: Insufficient documentation

## 2022-07-03 DIAGNOSIS — L299 Pruritus, unspecified: Secondary | ICD-10-CM | POA: Insufficient documentation

## 2022-07-03 DIAGNOSIS — Z01818 Encounter for other preprocedural examination: Secondary | ICD-10-CM | POA: Insufficient documentation

## 2022-07-03 DIAGNOSIS — D229 Melanocytic nevi, unspecified: Secondary | ICD-10-CM | POA: Insufficient documentation

## 2022-07-03 NOTE — Assessment & Plan Note (Signed)
Has surgery planned, needs pre op clearance , requested by ortho for nephrology and I am referring her for Cardiology eval as well  Trapeze bar to be ordered to assist with getting in and out of bed

## 2022-07-03 NOTE — Assessment & Plan Note (Signed)
Associated with anxiety continue med management by Psychiatry

## 2022-07-03 NOTE — Assessment & Plan Note (Signed)
Asked:confirms currently smokes cigarettes Assess: Unwilling to set a quit date, but is cutting back Advise: needs to QUIT to reduce risk of cancer, cardio and cerebrovascular disease Assist: counseled for 5 minutes and literature provided Arrange: follow up in 2 to 4 months Advised to use nicoderm patches in place of cigarettes esp with proposed upcoming surgery

## 2022-07-03 NOTE — Telephone Encounter (Signed)
Lab report labs called after hours for provider on call for lab report

## 2022-07-03 NOTE — Assessment & Plan Note (Signed)
Asked:confirms currently smokes cigarettes Assess: Unwilling to set a quit date, but is cutting back, has recently been prescribed patches , needs to be nicotine free prior to upcoming surgery  Advise: needs to QUIT to reduce risk of cancer, cardio and cerebrovascular disease Assist: counseled for 5 minutes and literature provided Arrange: follow up in 2 to 4 months

## 2022-07-03 NOTE — Assessment & Plan Note (Addendum)
[name] is reminded of the importance of commitment to daily physical activity for 30 minutes or more, as able and the need to limit carbohydrate intake to 30 to 60 grams per meal to help with blood sugar control.   The need to take medication as prescribed, test blood sugar as directed, and to call between visits if there is a concern that blood sugar is uncontrolled is also discussed.   [name] is reminded of the importance of daily foot exam, annual eye examination, and good blood sugar, blood pressure and cholesterol control. Managed by Endo,      Latest Ref Rng & Units 06/29/2022   11:50 AM 05/12/2022   10:21 AM 04/20/2022    3:00 PM 03/09/2022    2:18 PM 01/27/2022    9:36 AM  Diabetic Labs  HbA1c 4.0 - 5.6 %   5.5     Micro/Creat Ratio 0 - 29 mg/g creat    41    Chol 100 - 199 mg/dL 161       HDL >09 mg/dL 54       Calc LDL 0 - 99 mg/dL 53       Triglycerides 0 - 149 mg/dL 81       Creatinine 6.04 - 1.00 mg/dL 5.40  9.81    1.91       06/28/2022   12:04 PM 06/28/2022   11:24 AM 06/23/2022    1:20 PM 06/23/2022    8:00 AM 06/21/2022   11:17 AM 06/20/2022    3:16 PM 06/12/2022    3:11 PM  BP/Weight  Systolic BP 140 159 143 146 148 140 138  Diastolic BP 70 73 61 59 47 62 62  Wt. (Lbs)  168    168.4   BMI  33.93 kg/m2    34.01 kg/m2       Latest Ref Rng & Units 05/26/2020   12:00 AM 01/01/2019    1:40 PM  Foot/eye exam completion dates  Eye Exam No Retinopathy No Retinopathy       Foot Form Completion   Done     This result is from an external source.

## 2022-07-03 NOTE — Assessment & Plan Note (Signed)
Controlled, no change in medication  

## 2022-07-03 NOTE — Assessment & Plan Note (Signed)
Ms. Lapierre is reminded of the importance of commitment to daily physical activity for 30 minutes or more, as able and the need to limit carbohydrate intake to 30 to 60 grams per meal to help with blood sugar control.   The need to take medication as prescribed, test blood sugar as directed, and to call between visits if there is a concern that blood sugar is uncontrolled is also discussed.   Ms. Ruest is reminded of the importance of daily foot exam, annual eye examination, and good blood sugar, blood pressure and cholesterol control.     Latest Ref Rng & Units 06/29/2022   11:50 AM 05/12/2022   10:21 AM 04/20/2022    3:00 PM 03/09/2022    2:18 PM 01/27/2022    9:36 AM  Diabetic Labs  HbA1c 4.0 - 5.6 %   5.5     Micro/Creat Ratio 0 - 29 mg/g creat    41    Chol 100 - 199 mg/dL 161       HDL >09 mg/dL 54       Calc LDL 0 - 99 mg/dL 53       Triglycerides 0 - 149 mg/dL 81       Creatinine 6.04 - 1.00 mg/dL 5.40  9.81    1.91       06/28/2022   12:04 PM 06/28/2022   11:24 AM 06/23/2022    1:20 PM 06/23/2022    8:00 AM 06/21/2022   11:17 AM 06/20/2022    3:16 PM 06/12/2022    3:11 PM  BP/Weight  Systolic BP 140 159 143 146 148 140 138  Diastolic BP 70 73 61 59 47 62 62  Wt. (Lbs)  168    168.4   BMI  33.93 kg/m2    34.01 kg/m2       Latest Ref Rng & Units 05/26/2020   12:00 AM 01/01/2019    1:40 PM  Foot/eye exam completion dates  Eye Exam No Retinopathy No Retinopathy       Foot Form Completion   Done     This result is from an external source.      Managed by Endo has had hypoglycemia and hBa1C is in normal range, advised to eat on a regular schedule

## 2022-07-03 NOTE — Assessment & Plan Note (Signed)
No visible lesions, topical steroid prescribed, clobetasol

## 2022-07-03 NOTE — Assessment & Plan Note (Signed)
DASH diet and commitment to daily physical activity for a minimum of 30 minutes discussed and encouraged, as a part of hypertension management. The importance of attaining a healthy weight is also discussed.     06/28/2022   12:04 PM 06/28/2022   11:24 AM 06/23/2022    1:20 PM 06/23/2022    8:00 AM 06/21/2022   11:17 AM 06/20/2022    3:16 PM 06/12/2022    3:11 PM  BP/Weight  Systolic BP 140 159 143 146 148 140 138  Diastolic BP 70 73 61 59 47 62 62  Wt. (Lbs)  168    168.4   BMI  33.93 kg/m2    34.01 kg/m2      Adequate control, h/o recurrent falls no med adjustment

## 2022-07-03 NOTE — Assessment & Plan Note (Addendum)
Refer Nephrology for E/M s well as pre op clearance for upcoming left shoulder surgery

## 2022-07-03 NOTE — Assessment & Plan Note (Signed)
Skin tag of left neck, will refer to Dermatology for future appt

## 2022-07-03 NOTE — Assessment & Plan Note (Signed)
History and exam as documented Cardiology and Nephrology consults requested . CCUA to be collected

## 2022-07-03 NOTE — Assessment & Plan Note (Signed)
Followed and managed by Pulmonary, compliance with treatment encouraged

## 2022-07-04 ENCOUNTER — Encounter: Payer: Self-pay | Admitting: Podiatry

## 2022-07-04 ENCOUNTER — Ambulatory Visit (INDEPENDENT_AMBULATORY_CARE_PROVIDER_SITE_OTHER): Payer: Medicare HMO | Admitting: Podiatry

## 2022-07-04 ENCOUNTER — Telehealth: Payer: Self-pay | Admitting: *Deleted

## 2022-07-04 VITALS — BP 144/64

## 2022-07-04 DIAGNOSIS — M79676 Pain in unspecified toe(s): Secondary | ICD-10-CM | POA: Diagnosis not present

## 2022-07-04 DIAGNOSIS — B351 Tinea unguium: Secondary | ICD-10-CM | POA: Diagnosis not present

## 2022-07-04 DIAGNOSIS — E1142 Type 2 diabetes mellitus with diabetic polyneuropathy: Secondary | ICD-10-CM | POA: Diagnosis not present

## 2022-07-04 NOTE — Telephone Encounter (Signed)
   Pre-operative Risk Assessment    Patient Name: [name]  DOB: Aug 02, 1950 MRN: [id number]      Request for Surgical Clearance    Procedure:   LEFT REVERSE SHOULDER ARTHROPLASTY  Date of Surgery:  Clearance TBD                                 Surgeon:  DR. Malon Kindle Surgeon's Group or Practice Name:  [facility] Phone number:  [phone] ATTN: MEGAN DAVIS Fax number:  [phone]   Type of Clearance Requested:   - Medical ; ASA    Type of Anesthesia:   CHOICE   Additional requests/questions:    [name]   07/04/2022, 6:02 PM

## 2022-07-04 NOTE — Telephone Encounter (Signed)
Patient aware of lab results.

## 2022-07-05 NOTE — Telephone Encounter (Signed)
   Patient Name: JASELYN NAHM  DOB: [birth year] MRN: [id number]  Primary Cardiologist: Armanda Magic, MD  Chart reviewed as part of pre-operative protocol coverage. Given past medical history and time since last visit, based on ACC/AHA guidelines, Fayola Meckes Bitting is at acceptable risk for the planned procedure without further cardiovascular testing.   Per office protocol, she may hold Aspirin for 5-7 days prior to procedure. Please resume Aspirin as soon as possible postprocedure, at the discretion of the surgeon.    I will route this recommendation to the requesting party via Epic fax function and remove from pre-op pool.  Please call with questions.  Joylene Grapes, NP 07/05/2022, 2:33 PM

## 2022-07-05 NOTE — Telephone Encounter (Signed)
   Primary Cardiologist: Armanda Magic, MD  Chart reviewed as part of pre-operative protocol coverage. Her RCRI is 0.9% and she is able to achieve > 4 METS activity without significant cardiac symptoms.   Based on ACC/AHA guidelines, Fany Cavanaugh Lung would be at acceptable risk for the planned procedure without further cardiovascular testing.    Levi Aland, NP-C  07/05/2022, 2:02 PM [address], [address] Office [phone] Fax [phone]

## 2022-07-06 ENCOUNTER — Telehealth (INDEPENDENT_AMBULATORY_CARE_PROVIDER_SITE_OTHER): Payer: Medicare HMO | Admitting: Psychiatry

## 2022-07-06 ENCOUNTER — Encounter (HOSPITAL_COMMUNITY): Payer: Self-pay | Admitting: Psychiatry

## 2022-07-06 ENCOUNTER — Other Ambulatory Visit (HOSPITAL_COMMUNITY): Payer: Self-pay | Admitting: Psychiatry

## 2022-07-06 DIAGNOSIS — F331 Major depressive disorder, recurrent, moderate: Secondary | ICD-10-CM

## 2022-07-06 DIAGNOSIS — Z01818 Encounter for other preprocedural examination: Secondary | ICD-10-CM | POA: Diagnosis not present

## 2022-07-06 MED ORDER — SERTRALINE HCL 100 MG PO TABS: 100.0000 mg | ORAL_TABLET | ORAL | 2 refills | Status: DC

## 2022-07-06 MED ORDER — BUPROPION HCL ER (XL) 150 MG PO TB24: 150.0000 mg | ORAL_TABLET | ORAL | 2 refills | Status: DC

## 2022-07-06 MED ORDER — RISPERIDONE 0.5 MG PO TABS: 0.5000 mg | ORAL_TABLET | Freq: Every day | ORAL | 2 refills | Status: DC

## 2022-07-06 MED ORDER — TRAZODONE HCL 150 MG PO TABS: 150.0000 mg | ORAL_TABLET | Freq: Every day | ORAL | 2 refills | Status: DC

## 2022-07-06 MED ORDER — LAMOTRIGINE 100 MG PO TABS: 100.0000 mg | ORAL_TABLET | Freq: Two times a day (BID) | ORAL | 2 refills | Status: DC

## 2022-07-06 MED ORDER — LORAZEPAM 0.5 MG PO TABS: 0.5000 mg | ORAL_TABLET | Freq: Two times a day (BID) | ORAL | 2 refills | Status: DC

## 2022-07-06 NOTE — Progress Notes (Signed)
Virtual Visit via Telephone Note  I connected with [name] on 07/06/22 at  1:00 PM EDT by telephone and verified that I am speaking with the correct person using two identifiers.  Location: Patient: home Provider: office   I discussed the limitations, risks, security and privacy concerns of performing an evaluation and management service by telephone and the availability of in person appointments. I also discussed with the patient that there may be a patient responsible charge related to this service. The patient expressed understanding and agreed to proceed.    I discussed the assessment and treatment plan with the patient. The patient was provided an opportunity to ask questions and all were answered. The patient agreed with the plan and demonstrated an understanding of the instructions.   The patient was advised to call back or seek an in-person evaluation if the symptoms worsen or if the condition fails to improve as anticipated.  I provided 15 minutes of non-face-to-face time during this encounter.   Diannia Ruder, MD  [facility] MD/PA/NP OP Progress Note  07/06/2022 1:19 PM [name]  MRN:  [id number]  Chief Complaint:  Chief Complaint  Patient presents with   Depression   Anxiety   Follow-up   HPI: This patient is a 72 year old divorced white female who lives alone in [place].  She had worked in a [employer] but is now on disability.  The patient returns for follow-up after 3 months.  She states that she is doing about the same.  She has chronic shoulder and back pain and is supposed to have shoulder surgery at she is medically cleared.  She has a lot of trouble sleeping because of pain.  She asked for an increase in trazodone but I am reluctant to do this given her age and I do not want her to get groggy and fall.  She still has a home health aide every day now.  She is compliant with her medications and for the most part they are helping with her mood.  She complains  of shaking in her hands which could be secondary to Risperdal but she does not want to stop it.  She is to states that she is supposed to see a neurologist about this. Visit Diagnosis:    ICD-10-CM   1. Major depressive disorder, recurrent episode, moderate  F33.1       Past Psychiatric History: Numerous hospitalizations for depression years ago including ECT treatment  Past Medical History:  Past Medical History:  Diagnosis Date   Allergy    Anemia    Anemia in chronic kidney disease (CKD) 08/10/2021   Anemia in chronic kidney disease (CKD) 10/12/2021   Anxiety    takes Ativan daily   Arthritis    Assistance needed for mobility    Bipolar disorder    takes Risperdal nightly   Blood transfusion    Brain tumor    Cancer    In her gum   Carpal tunnel syndrome of right wrist 05/23/2011   Cervical disc disorder with radiculopathy of cervical region 10/31/2012   Chronic back pain    Chronic idiopathic constipation    Chronic neck and back pain    Colon polyps    COPD (chronic obstructive pulmonary disease) with chronic bronchitis 09/16/2013   Office Spirometry 10/30/2013-submaximal effort based on appearance of loop and curve. Numbers would fit with severe restriction but her physiologic capability may be better than this. FVC 0.91/44%, and 10.74/45%, FEV1/FVC 0.81, FEF 25-75% 1.43/69%  Diabetes mellitus    Type II   Diverticulosis    TCS 9/08 by Dr. Lina Sar for diarrhea . Bx for micro scopic colitis negative.    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   Metabolic encephalopathy 08/03/2011   Migraines    chronic headaches   Mononeuritis lower limb    Narcolepsy    Osteoporosis    Pancreatitis 2006   due to Depakote with normal EUS    Paralysis    Schatzki's ring    non critical / EGD with ED 8/2011with RMR   Seizures    takes Lamictal daily.Last seizure 3 yrs ago   Sleep apnea    on CPAP   Small bowel obstruction    Stroke    left sided weakness, speech changes   Tubular adenoma of colon     Past Surgical History:  Procedure Laterality Date   ABDOMINAL HYSTERECTOMY  1978   BACK SURGERY  July 2012   BACTERIAL OVERGROWTH TEST N/A 05/05/2013   Procedure: BACTERIAL OVERGROWTH TEST;  Surgeon: Corbin Ade, MD;  Location: [facility];  Service: Endoscopy;  Laterality: N/A;  7:30   BIOPSY THYROID  2009   BRAIN SURGERY  11/2011   resection of meningioma   BREAST REDUCTION SURGERY  1994   CARDIAC CATHETERIZATION  05/10/2005   normal coronaries, normal LV systolic function and EF (Dr. Evlyn Courier)   CARPAL TUNNEL RELEASE Left 07/22/04   Dr. Romeo Apple   CATARACT EXTRACTION Bilateral    CHOLECYSTECTOMY  1984   COLONOSCOPY N/A 09/25/2012   [id number] diverticulosis.  colonic polyp-removed : tubular adenoma   CRANIOTOMY  11/23/2011   Procedure: CRANIOTOMY TUMOR EXCISION;  Surgeon: Hewitt Shorts, MD;  Location: [facility] NEUR[facility] ORS;  Service: Neurosurgery;  Laterality: N/A;  Craniotomy for tumor resection   ESOPHAGOGASTRODUODENOSCOPY  12/29/2010   Rourk-Retained food in the esophagus and stomach, small hiatal hernia, status post Maloney dilation of the esophagus   ESOPHAGOGASTRODUODENOSCOPY N/A 09/25/2012   [id number] atonic baggy esophagus status post Maloney dilation 56 F. Hiatal hernia   GIVENS CAPSULE STUDY N/A 01/15/2013   NORMAL.    IR GENERIC HISTORICAL  03/17/2016   IR RADIOLOGIST EVAL & MGMT 03/17/2016 [facility] RAD   LESION REMOVAL N/A 05/31/2015   Procedure: REMOVAL RIGHT AND LEFT LESIONS  OF MANDIBLE;  Surgeon: Ocie Doyne, DDS;  Location: [facility] OR;  Service: Oral Surgery;  Laterality: N/A;   MALONEY DILATION  12/29/2010   RMR;   NM MYOCAR PERF WALL MOTION  2006   "relavtiely normal" persantine, mild anterior thinning (breast attenuation artifact), no region of scar/ischemia   OVARIAN CYST REMOVAL     RECTOCELE REPAIR N/A 06/29/2015   Procedure: POSTERIOR REPAIR (RECTOCELE);  Surgeon: Tilda Burrow, MD;  Location: [facility] ORS;  Service: Gynecology;  Laterality: N/A;   REDUCTION MAMMAPLASTY Bilateral    SPINE SURGERY  09/29/2010   Dr. Shon Baton   surgical excision  of 3 tumors from right thigh and right buttock  and left upper thigh  2010   TOOTH EXTRACTION Bilateral 12/14/2014   Procedure: REMOVAL OF BILATERAL MANDIBULAR EXOSTOSES;  Surgeon: Ocie Doyne, DDS;  Location: [facility] OR;  Service: Oral Surgery;  Laterality: Bilateral;   TRANSTHORACIC ECHOCARDIOGRAM  2010   EF 60-65%, mild conc LVH, grade 1 diastolic dysfunction; mildly calcified MV annulus with mildly thickened leaflets, mildly calcified MR annulus    Family Psychiatric History: See below  Family History:  Family History  Problem Relation Age of Onset   Heart attack Mother        HTN   Pneumonia Father    Kidney failure Father    Diabetes Father    Pancreatic cancer Sister    Cancer Sister        breast    Cancer Sister        pancreatic   Diabetes Brother    Hypertension Brother    Diabetes Brother    Alcohol abuse Maternal Uncle    Stroke Maternal Grandmother    Heart attack Maternal Grandfather    Hypertension Son    Sleep apnea Son    Colon cancer Neg Hx    Anesthesia problems Neg Hx    Hypotension Neg Hx    Malignant hyperthermia Neg Hx    Pseudochol deficiency Neg Hx    Breast cancer Neg Hx    Stomach cancer Neg Hx     Social History:  Social History   Socioeconomic History   Marital status: Legally Separated    Spouse name: Not on file   Number of children: 1   Years of education: 12    Highest education level: High school graduate  Occupational History   Occupation: Disabled  Tobacco Use   Smoking status: Some Days    Packs/day: 0.25    Years: 30.00    Additional pack years: 0.00    Total pack years: 7.50    Types: Cigarettes    Last attempt to quit: 07/05/2021    Years since quitting: 1.0    Passive exposure: Past   Smokeless tobacco: Never   Tobacco comments:    3-4 a day MRC 05/17/2021  Vaping Use   Vaping Use: Never used  Substance and Sexual Activity   Alcohol use: No    Alcohol/week: 0.0 standard drinks of alcohol    Comment:     Drug use: No   Sexual activity: Not Currently  Other Topics Concern   Not on file  Social History Narrative   01/29/18 Lives alone, has 3 aides, Mon- Fri 8 hrs, 2 hrs on Sat-Sun, RN manages her meds   Caffeine use: Drink coffee sometimes    Right handed    Social Determinants of Health   Financial Resource Strain: Low Risk  (01/03/2022)   Overall Financial Resource Strain (CARDIA)    Difficulty of Paying Living Expenses: Not very hard  Food Insecurity: No Food Insecurity (01/03/2022)   Hunger Vital Sign    Worried About Running Out of Food in the Last Year: Never true    Ran Out of Food in the Last Year: Never true  Transportation Needs: No Transportation Needs (01/03/2022)   PRAPARE - Administrator, Civil Service (Medical): No    Lack of Transportation (Non-Medical): No  Physical Activity: Insufficiently Active (01/03/2022)   Exercise Vital Sign    Days of Exercise per Week: 2 days    Minutes of Exercise per Session:  20 min  Stress: No Stress Concern Present (01/03/2022)   [employer] of Occupational Health - Occupational Stress Questionnaire    Feeling of Stress : Only a little  Social Connections: Moderately Integrated (01/03/2022)   Social Connection and Isolation Panel [NHANES]    Frequency of Communication with Friends and Family: Three times a week    Frequency of Social Gatherings with  Friends and Family: Once a week    Attends Religious Services: More than 4 times per year    Active Member of [employer] or Organizations: Yes    Attends Engineer, structural: More than 4 times per year    Marital Status: Divorced    Allergies:  Allergies  Allergen Reactions   Iron Nausea And Vomiting    And itching And itching   Milk (Cow) Rash    Doesn't agree with stomach.  Doesn't agree with stomach.  Doesn't agree with stomach.    Penicillins Hives    Has patient had a PCN reaction causing immediate rash, facial/tongue/throat swelling, SOB or lightheadedness with hypotension: Yes Has patient had a PCN reaction causing severe rash involving mucus membranes or skin necrosis: No Has patient had a PCN reaction that required hospitalization No Has patient had a PCN reaction occurring within the last 10 years: No If all of the above answers are "NO", then may proceed with Cephalosporin use.  Other reaction(s): Other (see comments) Has patient had a PCN reaction causing immediate rash, facial/tongue/throat swelling, SOB or lightheadedness with hypotension: Yes Has patient had a PCN reaction causing severe rash involving mucus membranes or skin necrosis: No Has patient had a PCN reaction that required hospitalization No Has patient had a PCN reaction occurring within the last 10 years: No If all of the above answers are "NO", then may proceed with Cephalosporin use. Has patient had a PCN reaction causing immediate rash, facial/tongue/throat swelling, SOB or lightheadedness with hypotension: Yes Has patient had a PCN reaction causing severe rash involving mucus membranes or skin necrosis: No Has patient had a PCN reaction that required hospitalization No Has patient had a PCN reaction occurring within the last 10 years: No If all of the above answers are "NO", then may proceed with Cephalosporin use. Has patient had a PCN reaction causing immediate rash, facial/tongue/throat  swelling, SOB or lightheadedness with hypotension: Yes Has patient had a PCN reaction causing severe rash involving mucus membranes or skin necrosis: No Has patient had a PCN reaction that required hospitalization No Has patient had a PCN reaction occurring within the last 10 years: No If all of the above answers are "NO", then may proceed with Cephalosporin use. Has patient had a PCN reaction causing immediate rash, facial/tongue/throat swelling, SOB or lightheadedness with hypotension: Yes Has patient had a PCN reaction causing sever... (TRUNCATED)   Phenazopyridine Hives   Phenazopyridine Hcl Hives   Cephalexin Hives   Flonase [Fluticasone]     "It gave me ulcers in my nose"   Milk-Related Compounds Other (See Comments)    Doesn't agree with stomach.    Phenazopyridine Hcl Hives          Metabolic Disorder Labs: Lab Results  Component Value Date   HGBA1C 5.5 04/20/2022   MPG 114.02 09/16/2021   MPG 108 09/30/2018   No results found for: "PROLACTIN" Lab Results  Component Value Date   CHOL 123 06/29/2022   TRIG 81 06/29/2022   HDL 54 06/29/2022   CHOLHDL 2.3 06/29/2022   VLDL 26 07/19/2015  LDLCALC 53 06/29/2022   LDLCALC 79 05/10/2021   Lab Results  Component Value Date   TSH 1.060 06/29/2022   TSH 0.494 10/04/2021    Therapeutic Level Labs: Lab Results  Component Value Date   LITHIUM <0.06 (L) 10/15/2016   Lab Results  Component Value Date   VALPROATE <10.0 (L) 08/23/2007   No results found for: "CBMZ"  Current Medications: Current Outpatient Medications  Medication Sig Dispense Refill   ACCU-CHEK GUIDE test strip USE TO CHECK BLOOD SUGAR FOUR TIMES A DAY AND AS NEEDED (NEED MD APPOINTMENT FOR ANY FURTHER REFILLS) 450 strip 3   Accu-Chek Softclix Lancets lancets TEST BLOOD SUGAR THREE TIMES DAILY AS DIRECTED 300 each 2   acetaminophen (TYLENOL) 325 MG tablet Take 650 mg by mouth every 6 (six) hours as needed.     albuterol (PROVENTIL) (2.5 MG/3ML)  0.083% nebulizer solution Take 3 mLs (2.5 mg total) by nebulization every 6 (six) hours as needed for wheezing or shortness of breath. 75 mL 12   albuterol (VENTOLIN HFA) 108 (90 Base) MCG/ACT inhaler INHALE 1 TO 2 PUFFS EVERY 6 HOURS AS NEEDED FOR WHEEZING, SHORTNESS OF BREATH (Patient taking differently: Inhale 1-2 puffs into the lungs every 6 (six) hours as needed for wheezing or shortness of breath.) 3 each 3   Alcohol Swabs (DROPSAFE ALCOHOL PREP) 70 % PADS USE TO CLEAN FINGER PRIOR TO TESTING FOR BLOOD SUGAR AS DIRECTED 300 each 3   alendronate (FOSAMAX) 70 MG tablet TAKE 1 TABLET EVERY 7 DAYS ON AN EMPTY STOMACH WITH A FULL GLASS OF WATER (Patient taking differently: Take 70 mg by mouth once a week.) 12 tablet 3   amLODipine (NORVASC) 10 MG tablet TAKE 1 TABLET EVERY DAY 60 tablet 0   ascorbic acid (VITAMIN C) 500 MG tablet Take 500 mg by mouth daily.     aspirin EC 81 MG tablet Take 1 tablet (81 mg total) by mouth daily with breakfast. 120 tablet 2   benzonatate (TESSALON) 200 MG capsule Take 1 capsule (200 mg total) by mouth 3 (three) times daily as needed for cough. 40 capsule 0   Blood Glucose Calibration (ACCU-CHEK GUIDE CONTROL) LIQD USE AS DIRECTED 1 each 0   blood glucose meter kit and supplies Dispense based on patient and insurance preference. Use up to four times daily as directed. (FOR ICD-10 E10.9, E11.9). 1 each 0   buPROPion (WELLBUTRIN XL) 150 MG 24 hr tablet Take 1 tablet (150 mg total) by mouth every morning. 90 tablet 2   Calcium Carb-Cholecalciferol (CALTRATE 600+D3 SOFT) 600-20 MG-MCG CHEW Chew 1 tablet by mouth daily at 12 noon.     cetirizine (ZYRTEC) 10 MG tablet Take 1 tablet (10 mg total) by mouth daily. 30 tablet 1   Cholecalciferol (D3 PO) Take 1 tablet by mouth daily.     clobetasol (TEMOVATE) 0.05 % external solution Apply 1 Application topically 2 (two) times daily. 50 mL 1   Continuous Blood Gluc Sensor (FREESTYLE LIBRE 14 DAY SENSOR) MISC 1 each by Does not  apply route every 14 (fourteen) days. Change every 2 weeks 2 each 11   diazepam (VALIUM) 5 MG tablet Take 1 tablet (5 mg total) by mouth once as needed (MRI claustrophobia). 6 tablet 0   Docusate Sodium (DSS) 100 MG CAPS Take 1 tablet by mouth in the morning and at bedtime.     DROPLET PEN NEEDLES 31G X 8 MM MISC USE FOR INJECTING INSULIN 4 TIMES DAILY. 400 each 0  Elastic Bandages & Supports (ADJUSTABLE ARM SLING) MISC L arm sling 1 each 0   ezetimibe (ZETIA) 10 MG tablet TAKE 1 TABLET EVERY DAY 90 tablet 10   hydrOXYzine (ATARAX) 10 MG tablet Take one tablet by mouth once daily, as needed, for excessive itching 14 tablet 0   insulin aspart (NOVOLOG FLEXPEN) 100 UNIT/ML FlexPen Inject 5 Units into the skin 3 (three) times daily with meals. Give if eats 50% or more of meal. (Patient taking differently: Inject 14 Units into the skin 3 (three) times daily with meals. Give if eats 50% or more of meal.) 60 mL 0   ipratropium (ATROVENT) 0.03 % nasal spray Place 2 sprays into both nostrils every 12 (twelve) hours. 30 mL 12   lactobacillus acidophilus (BACID) TABS tablet Take 2 tablets by mouth 3 (three) times daily.     lamoTRIgine (LAMICTAL) 100 MG tablet Take 1 tablet (100 mg total) by mouth 2 (two) times daily. 180 tablet 2   levothyroxine (SYNTHROID) 50 MCG tablet TAKE 1 TABLET DAILY SIX DAYS A WEEK AND 1/2 TABLET ON ONE DAY A WEEK 85 tablet 2   LORazepam (ATIVAN) 0.5 MG tablet Take 1 tablet (0.5 mg total) by mouth 2 (two) times daily. 60 tablet 2   methocarbamol (ROBAXIN-750) 750 MG tablet Take 1 tablet (750 mg total) by mouth every 8 (eight) hours as needed for muscle spasms. 21 tablet 0   metoprolol tartrate (LOPRESSOR) 50 MG tablet TAKE 1 TABLET TWICE DAILY (NEED MD APPOINTMENT) (Patient taking differently: Take 50 mg by mouth 2 (two) times daily.) 180 tablet 3   Misc. Devices (MATTRESS PAD) MISC FIRM MATTRESS PAD for hospital bed x 1 1 each 0   montelukast (SINGULAIR) 10 MG tablet TAKE 1 TABLET  EVERY DAY 90 tablet 3   Multiple Vitamins-Minerals (MULTIVITAMIN GUMMIES ADULT PO) Take 1 tablet by mouth daily.     MYRBETRIQ 25 MG TB24 tablet TAKE 1 TABLET EVERY DAY 90 tablet 3   nicotine (NICODERM CQ) 21 mg/24hr patch Place 1 patch (21 mg total) onto the skin daily. 28 patch 0   Omega-3 Fatty Acids (FISH OIL PO) Take 1 capsule by mouth daily.     oxyCODONE-acetaminophen (PERCOCET) 10-325 MG tablet Take 1 tablet by mouth every 8 (eight) hours as needed for pain.     potassium chloride (KLOR-CON) 10 MEQ tablet Take 1 tablet (10 mEq total) by mouth 2 (two) times daily. 30 tablet 2   pregabalin (LYRICA) 75 MG capsule Take 1 capsule (75 mg total) by mouth daily.     RABEprazole (ACIPHEX) 20 MG tablet TAKE 1 TABLET TWICE DAILY 180 tablet 2   RESTASIS 0.05 % ophthalmic emulsion Place 2 drops into both eyes daily.     risperiDONE (RISPERDAL) 0.5 MG tablet Take 1 tablet (0.5 mg total) by mouth at bedtime. 90 tablet 2   rosuvastatin (CRESTOR) 5 MG tablet TAKE 1 TABLET AT BEDTIME 90 tablet 3   sertraline (ZOLOFT) 100 MG tablet Take 1 tablet (100 mg total) by mouth every morning. 90 tablet 2   SPIKEVAX injection      spironolactone (ALDACTONE) 50 MG tablet TAKE 1 TABLET (50 MG TOTAL) BY MOUTH DAILY. DISCONTINUE SPIRONOLACTONE 25MG  90 tablet 3   telmisartan (MICARDIS) 20 MG tablet Take 10 mg by mouth daily.     TOUJEO MAX SOLOSTAR 300 UNIT/ML Solostar Pen INJECT 20-26 UNITS INTO THE SKIN DAILY. 12 mL 3   traZODone (DESYREL) 150 MG tablet Take 1 tablet (150 mg total)  by mouth at bedtime. 90 tablet 2   umeclidinium-vilanterol (ANORO ELLIPTA) 62.5-25 MCG/ACT AEPB Inhale 1 puff into the lungs daily. 60 each 5   vitamin E 180 MG (400 UNITS) capsule Take 400 Units by mouth daily.     No current facility-administered medications for this visit.     Musculoskeletal: Strength & Muscle Tone: na Gait & Station: na Patient leans: N/A  Psychiatric Specialty Exam: Review of Systems  Musculoskeletal:   Positive for arthralgias, back pain and joint swelling.  Neurological:  Positive for tremors.  Psychiatric/Behavioral:  Positive for sleep disturbance.   All other systems reviewed and are negative.   There were no vitals taken for this visit.There is no height or weight on file to calculate BMI.  General Appearance: NA  Eye Contact:  NA  Speech:  Clear and Coherent  Volume:  Normal  Mood:  Anxious  Affect:  NA  Thought Process:  Goal Directed  Orientation:  Full (Time, Place, and Person)  Thought Content: Rumination   Suicidal Thoughts:  No  Homicidal Thoughts:  No  Memory:  Immediate;   Good Recent;   Fair Remote;   NA  Judgement:  Fair  Insight:  Fair  Psychomotor Activity:  Decreased  Concentration:  Concentration: Fair and Attention Span: Fair  Recall:  [employer] of Knowledge: Fair  Language: Good  Akathisia:  No  Handed:  Right  AIMS (if indicated): not done  Assets:  Communication Skills Desire for Improvement Resilience Social Support  ADL's:  Intact  Cognition: WNL  Sleep:  Poor   Screenings: GAD-7    Flowsheet Row Office Visit from 04/13/2022 in [facility] Office Visit from 03/09/2022 in [facility]  Total GAD-7 Score 4 8      Mini-Mental    Flowsheet Row Office Visit from 11/24/2020 in [facility] Office Visit from 02/03/2019 in [facility] Office Visit from 09/08/2015 in [facility] Office Visit from 07/02/2014 in [facility] Office Visit from 12[facility]08/2013 in [facility]  Total Score (max 30 points ) PHQ2-9    Flowsheet Row Counselor from 05/25/2022 in [facility] [place] Counselor from 05/11/2022 in [facility] [place] Office Visit from 04/27/2022 in [facility] Care Counselor from 04/26/2022 in [facility]  [facility] [place] Office Visit from 04/13/2022 in [facility] Primary [facility]  PHQ-2 Total Score 5 2 0 2 2  PHQ-9 Total Score 13 14 -- 8 6      Flowsheet Row ED from 06/06/2022 in [facility] from 02/20/2022 in [facility] [place] ED from 12/16/2021 in [facility] RISK CATEGORY No Risk Low Risk No Risk        Assessment and Plan: This patient is a 72 year old female with a history of depression anxiety and mood swings.  She is having trouble sleeping secondary to pain.  However I am very reluctant to increase any medications given her age.  She complains of tremor and I suggest that she address this with neurology and ask about discontinuing the Risperdal.  For now she will continue Risperdal 0.5 mg at bedtime for mood stabilization, trazodone 150 mg at bedtime for sleep, Wellbutrin XL 150 mg daily as  well as Zoloft 100 mg daily for depression Lamictal 100 mg twice daily for mood stabilization and Ativan 0.5 mg twice daily for anxiety she will return to see me in 3 months  Collaboration of Care: Collaboration of Care: Primary Care Provider AEB notes are shared with PCP through the epic system  Patient/Guardian was advised Release of Information must be obtained prior to any record release in order to collaborate their care with an outside provider. Patient/Guardian was advised if they have not already done so to contact the registration department to sign all necessary forms in order for [name] to release information regarding their care.   Consent: Patient/Guardian gives verbal consent for treatment and assignment of benefits for services provided during this visit. Patient/Guardian expressed understanding and agreed to proceed.    Diannia Ruder, MD 07/06/2022, 1:19 PM

## 2022-07-07 ENCOUNTER — Ambulatory Visit (INDEPENDENT_AMBULATORY_CARE_PROVIDER_SITE_OTHER): Payer: Medicare HMO | Admitting: *Deleted

## 2022-07-07 DIAGNOSIS — E1143 Type 2 diabetes mellitus with diabetic autonomic (poly)neuropathy: Secondary | ICD-10-CM

## 2022-07-07 DIAGNOSIS — I10 Essential (primary) hypertension: Secondary | ICD-10-CM

## 2022-07-07 LAB — UA/M W/RFLX CULTURE, ROUTINE
Bilirubin, UA: NEGATIVE
Glucose, UA: NEGATIVE
Ketones, UA: NEGATIVE
Leukocytes,UA: NEGATIVE
Nitrite, UA: NEGATIVE
Protein,UA: NEGATIVE
RBC, UA: NEGATIVE
Specific Gravity, UA: 1.018 (ref 1.005–1.030)
Urobilinogen, Ur: 0.2 mg/dL (ref 0.2–1.0)
pH, UA: 5.5 (ref 5.0–7.5)

## 2022-07-07 LAB — MICROSCOPIC EXAMINATION
Bacteria, UA: NONE SEEN
Casts: NONE SEEN /lpf
WBC, UA: NONE SEEN /hpf (ref 0–5)

## 2022-07-07 NOTE — Patient Instructions (Signed)
Please call the care guide team at [phone] if you need to cancel or reschedule your appointment.   If you are experiencing a Mental Health or Behavioral Health Crisis or need someone to talk to, please call the Suicide and Crisis Lifeline: 988 call the [place] National Suicide Prevention Lifeline: 971-571-4083 or TTY: (724) 750-2002 TTY 901-105-5586) to talk to a trained counselor call 1-800-273-TALK (toll free, 24 hour hotline) go to [facility] [address] [place] [phone]) call the [facility]: [phone] call 911   Following is a copy of the CCM Program Consent:  CCM service includes personalized support from designated clinical staff supervised by the physician, including individualized plan of care and coordination with other care providers 24/7 contact phone numbers for assistance for urgent and routine care needs. Service will only be billed when office clinical staff spend 20 minutes or more in a month to coordinate care. Only one practitioner may furnish and bill the service in a calendar month. The patient may stop CCM services at amy time (effective at the end of the month) by phone call to the office staff. The patient will be responsible for cost sharing (co-pay) or up to 20% of the service fee (after annual deductible is met)  Following is a copy of your full provider care plan:   Goals Addressed             This Visit's Progress    CCM (DIABETES) EXPECTED OUTCOME: MONITOR, SELF-MANAGE AND REDUCE SYMPTOMS OF DIABETES       Current Barriers:  Knowledge Deficits related to Diabetes Chronic Disease Management support and education needs related to management of Diabetes, diet Patient reports she lives alone, has CAP aide 8 hours per day/ 7 days per week and she provides transportation as well as church members assist, patient reports she checks CBG QID with fasting ranges low 100's recently with reading  140 today, random readings in 100's, (states no low readings recently)  (has [employer] and states she has decided not to use) feels this is too much trouble, Patient does not give permission for RN care manager to speak with aide.  Patient reports she has had no recent falls since August 2023. Patient states nurse from [facility] no longer prefills medications due to [facility] no longer pays for this service, pt reports she is taking as prescribed.  Pt has needed DME - WC, cane, walker, hospital bed. Patient reports she now has CPAP machine and is using nightly without any issues  Planned Interventions: Reviewed medications with patient and discussed importance of medication adherence;        Counseled on importance of regular laboratory monitoring as prescribed;        Advised patient, providing education and rationale, to check cbg per doctor's order  and record        call provider for findings outside established parameters;       Review of patient status, including review of consultants reports, relevant laboratory and other test results, and medications completed;       Reviewed with patient the importance of taking medications as prescribed, do not skip doses, do not double up, do not swap medication around  Reinforced with patient if she wants to lock up the medications as she says she does sometimes, to make sure and unlock at correct time and take medication as prescribed, pt verbalizes understanding Reinforced importance of using CPAP as prescribed Reinforced carbohydrate modified diet Reviewed  with pt that her aide can watch her take medications to make sure she is taking correctly if there seem to be any issues or confusion for pt  Symptom Management: Take medications as prescribed   Attend all scheduled provider appointments Call pharmacy for medication refills 3-7 days in advance of running out of medications Attend church or other social activities Call provider office  for new concerns or questions  check blood sugar at prescribed times: 4 times daily check feet daily for cuts, sores or redness enter blood sugar readings and medication or insulin into daily log take the blood sugar log to all doctor visits take the blood sugar meter to all doctor visits trim toenails straight across fill half of plate with vegetables manage portion size prepare main meal at home 3 to 5 days each week read food labels for fat, fiber, carbohydrates and portion size wash and dry feet carefully every day wear comfortable, cotton socks wear comfortable, well-fitting shoes Continue using CPAP machine as prescribed Take all medications as prescribed You acknowledged your aide is not allowed to prefill medication box but she can watch and make sure you take the correct medication out of each bottle, if you are having any issues/ confusion about medications  Follow Up Plan: Telephone follow up appointment with care management team member scheduled for:  09/13/22 at 3 pm       CCM (HYPERTENSION)  EXPECTED OUTCOME:  MONITOR,SELF- MANAGE AND REDUCE SYMPTOMS OF HYPERTENSION       Current Barriers:  Knowledge Deficits related to Hypertension Chronic Disease Management support and education needs related to management of Hypertension Patient reports her aide monitors blood pressure 7 days per week, states readings "vary from day to day but usually pretty good", pt states she is able to check her own blood pressure Patient reports she does smoke a "few cigarettes" per day but declines smoking cessation, states she continues to weigh daily for CHF Patient reports she continues following up with counselor and psychiatrist for mental health issues, feels this is helping and states " I can get my feelings out"  Planned Interventions: Evaluation of current treatment plan related to hypertension self management and patient's adherence to plan as established by provider;   Reviewed  medications with patient and discussed importance of compliance;  Counseled on the importance of exercise goals with target of 150 minutes per week Discussed plans with patient for ongoing care management follow up and provided patient with direct contact information for care management team; Discussed complications of poorly controlled blood pressure such as heart disease, stroke, circulatory complications, vision complications, kidney impairment, sexual dysfunction;  Reinforced low sodium diet  Symptom Management: Take medications as prescribed   Attend all scheduled provider appointments Call pharmacy for medication refills 3-7 days in advance of running out of medications Attend church or other social activities Call provider office for new concerns or questions  check blood pressure 3 times per week choose a place to take my blood pressure (home, clinic or office, retail store) write blood pressure results in a log or diary learn about high blood pressure take blood pressure log to all doctor appointments call doctor for signs and symptoms of high blood pressure develop an action plan for high blood pressure keep all doctor appointments take medications for blood pressure exactly as prescribed begin an exercise program report new symptoms to your doctor eat more whole grains, fruits and vegetables, lean meats and healthy fats Continue to follow low salt diet-  read labels Continue following up with your counselor and psychiatrist Continue to weigh daily  Follow Up Plan: Telephone follow up appointment with care management team member scheduled for:  09/13/22 at 3 pm          Patient verbalizes understanding of instructions and care plan provided today and agrees to view in MyChart. Active MyChart status and patient understanding of how to access instructions and care plan via MyChart confirmed with patient.  Telephone follow up appointment with care management team member  scheduled for:  09/13/22 at 3 pm

## 2022-07-07 NOTE — Chronic Care Management (AMB) (Signed)
Chronic Care Management   CCM RN Visit Note  07/07/2022 Name: [name] MRN: [id number] DOB: [birth year]  Subjective: [name] is a 72 y.o. year old female who is a primary care patient of Kerri Perches, MD. The patient was referred to the Chronic Care Management team for assistance with care management needs subsequent to provider initiation of CCM services and plan of care.    Today's Visit:  Engaged with patient by telephone for follow up visit.        Goals Addressed             This Visit's Progress    CCM (DIABETES) EXPECTED OUTCOME: MONITOR, SELF-MANAGE AND REDUCE SYMPTOMS OF DIABETES       Current Barriers:  Knowledge Deficits related to Diabetes Chronic Disease Management support and education needs related to management of Diabetes, diet Patient reports she lives alone, has CAP aide 8 hours per day/ 7 days per week and she provides transportation as well as church members assist, patient reports she checks CBG QID with fasting ranges low 100's recently with reading 140 today, random readings in 100's, (states no low readings recently)  (has [employer] and states she has decided not to use) feels this is too much trouble, Patient does not give permission for RN care manager to speak with aide.  Patient reports she has had no recent falls since August 2023. Patient states nurse from [facility] no longer prefills medications due to [facility] no longer pays for this service, pt reports she is taking as prescribed.  Pt has needed DME - WC, cane, walker, hospital bed. Patient reports she now has CPAP machine and is using nightly without any issues  Planned Interventions: Reviewed medications with patient and discussed importance of medication adherence;        Counseled on importance of regular laboratory monitoring as prescribed;        Advised patient, providing education and rationale, to check cbg per doctor's order  and record        call provider for  findings outside established parameters;       Review of patient status, including review of consultants reports, relevant laboratory and other test results, and medications completed;       Reviewed with patient the importance of taking medications as prescribed, do not skip doses, do not double up, do not swap medication around  Reinforced with patient if she wants to lock up the medications as she says she does sometimes, to make sure and unlock at correct time and take medication as prescribed, pt verbalizes understanding Reinforced importance of using CPAP as prescribed Reinforced carbohydrate modified diet Reviewed with pt that her aide can watch her take medications to make sure she is taking correctly if there seem to be any issues or confusion for pt  Symptom Management: Take medications as prescribed   Attend all scheduled provider appointments Call pharmacy for medication refills 3-7 days in advance of running out of medications Attend church or other social activities Call provider office for new concerns or questions  check blood sugar at prescribed times: 4 times daily check feet daily for cuts, sores or redness enter blood sugar readings and medication or insulin into daily log take the blood sugar log to all doctor visits take the blood sugar meter to all doctor visits trim toenails straight across fill half of plate with vegetables manage portion size prepare main meal at home 3 to 5 days each  week read food labels for fat, fiber, carbohydrates and portion size wash and dry feet carefully every day wear comfortable, cotton socks wear comfortable, well-fitting shoes Continue using CPAP machine as prescribed Take all medications as prescribed You acknowledged your aide is not allowed to prefill medication box but she can watch and make sure you take the correct medication out of each bottle, if you are having any issues/ confusion about medications  Follow Up Plan:  Telephone follow up appointment with care management team member scheduled for:  09/13/22 at 3 pm       CCM (HYPERTENSION)  EXPECTED OUTCOME:  MONITOR,SELF- MANAGE AND REDUCE SYMPTOMS OF HYPERTENSION       Current Barriers:  Knowledge Deficits related to Hypertension Chronic Disease Management support and education needs related to management of Hypertension Patient reports her aide monitors blood pressure 7 days per week, states readings "vary from day to day but usually pretty good", pt states she is able to check her own blood pressure Patient reports she does smoke a "few cigarettes" per day but declines smoking cessation, states she continues to weigh daily for CHF Patient reports she continues following up with counselor and psychiatrist for mental health issues, feels this is helping and states " I can get my feelings out"  Planned Interventions: Evaluation of current treatment plan related to hypertension self management and patient's adherence to plan as established by provider;   Reviewed medications with patient and discussed importance of compliance;  Counseled on the importance of exercise goals with target of 150 minutes per week Discussed plans with patient for ongoing care management follow up and provided patient with direct contact information for care management team; Discussed complications of poorly controlled blood pressure such as heart disease, stroke, circulatory complications, vision complications, kidney impairment, sexual dysfunction;  Reinforced low sodium diet  Symptom Management: Take medications as prescribed   Attend all scheduled provider appointments Call pharmacy for medication refills 3-7 days in advance of running out of medications Attend church or other social activities Call provider office for new concerns or questions  check blood pressure 3 times per week choose a place to take my blood pressure (home, clinic or office, retail store) write blood  pressure results in a log or diary learn about high blood pressure take blood pressure log to all doctor appointments call doctor for signs and symptoms of high blood pressure develop an action plan for high blood pressure keep all doctor appointments take medications for blood pressure exactly as prescribed begin an exercise program report new symptoms to your doctor eat more whole grains, fruits and vegetables, lean meats and healthy fats Continue to follow low salt diet- read labels Continue following up with your counselor and psychiatrist Continue to weigh daily  Follow Up Plan: Telephone follow up appointment with care management team member scheduled for:  09/13/22 at 3 pm          Plan:Telephone follow up appointment with care management team member scheduled for:  09/13/22 at 3 pm  Irving Shows [facility], BSN RN Case Manager [facility] [phone]

## 2022-07-08 NOTE — Progress Notes (Signed)
Subjective:  Patient ID: [name], female    DOB: Sep 25, 1950,  MRN: [id number]  [name] presents to clinic today for at risk foot care with history of diabetic neuropathy and painful elongated mycotic toenails 1-5 bilaterally which are tender when wearing enclosed shoe gear. Pain is relieved with periodic professional debridement.  Chief Complaint  Patient presents with   Nail Problem    Rm 15 RFC bilateral nail trim.    New problem(s): None.   PCP is Kerri Perches, MD.  Allergies  Allergen Reactions   Iron Nausea And Vomiting    And itching And itching   Milk (Cow) Rash    Doesn't agree with stomach.  Doesn't agree with stomach.  Doesn't agree with stomach.    Penicillins Hives    Has patient had a PCN reaction causing immediate rash, facial/tongue/throat swelling, SOB or lightheadedness with hypotension: Yes Has patient had a PCN reaction causing severe rash involving mucus membranes or skin necrosis: No Has patient had a PCN reaction that required hospitalization No Has patient had a PCN reaction occurring within the last 10 years: No If all of the above answers are "NO", then may proceed with Cephalosporin use.  Other reaction(s): Other (see comments) Has patient had a PCN reaction causing immediate rash, facial/tongue/throat swelling, SOB or lightheadedness with hypotension: Yes Has patient had a PCN reaction causing severe rash involving mucus membranes or skin necrosis: No Has patient had a PCN reaction that required hospitalization No Has patient had a PCN reaction occurring within the last 10 years: No If all of the above answers are "NO", then may proceed with Cephalosporin use. Has patient had a PCN reaction causing immediate rash, facial/tongue/throat swelling, SOB or lightheadedness with hypotension: Yes Has patient had a PCN reaction causing severe rash involving mucus membranes or skin necrosis: No Has patient had a PCN reaction that required  hospitalization No Has patient had a PCN reaction occurring within the last 10 years: No If all of the above answers are "NO", then may proceed with Cephalosporin use. Has patient had a PCN reaction causing immediate rash, facial/tongue/throat swelling, SOB or lightheadedness with hypotension: Yes Has patient had a PCN reaction causing severe rash involving mucus membranes or skin necrosis: No Has patient had a PCN reaction that required hospitalization No Has patient had a PCN reaction occurring within the last 10 years: No If all of the above answers are "NO", then may proceed with Cephalosporin use. Has patient had a PCN reaction causing immediate rash, facial/tongue/throat swelling, SOB or lightheadedness with hypotension: Yes Has patient had a PCN reaction causing sever... (TRUNCATED)   Phenazopyridine Hives   Phenazopyridine Hcl Hives   Cephalexin Hives   Flonase [Fluticasone]     "It gave me ulcers in my nose"   Milk-Related Compounds Other (See Comments)    Doesn't agree with stomach.    Phenazopyridine Hcl Hives          Review of Systems: Negative except as noted in the HPI.  Objective: No changes noted in today's physical examination. Vitals:   07/04/22 1121  BP: (!) 144/64   [name] is a pleasant 72 y.o. female in NAD. AAO x 3.  Neurovascular Examination: Capillary refill time to digits immediate b/l. Faintly palpable pedal pulses b/l LE. Pedal hair sparse b/l.  Lower extremity skin temperature gradient within normal limits. No edema noted b/l lower extremities.   Protective sensation intact diminished bilaterally with 10g monofilament b/l.  Dermatological:  Skin warm and supple b/l lower extremities. No open wounds b/l lower extremities. No interdigital macerations b/l lower extremities. Toenails 1-5 right, left great toe and 3-5 left elongated, discolored, dystrophic, thickened, crumbly with subungual debris and tenderness to dorsal palpation.   Anonychia  noted L 2nd toe. Nailbed(s) epithelialized.   No hyperkeratotic nor porokeratotic lesions present on today's visit.  Musculoskeletal:  Normal muscle strength 5/5 to all lower extremity muscle groups bilaterally. No pain crepitus or joint limitation noted with ROM b/l lower extremities. HAV with bunion deformity b/l. Uses walker for gait assistance.  Assessment/Plan: 1. Pain due to onychomycosis of toenail   2. Diabetic polyneuropathy associated with type 2 diabetes mellitus (HCC)     -Consent given for treatment as described below: -Examined patient. -Patient to continue soft, supportive shoe gear daily. -Toenails were debrided in length and girth 1-5 right foot, 3-5 left foot, and L hallux with sterile nail nippers and dremel without iatrogenic bleeding.  -Patient/POA to call should there be question/concern in the interim.   Return in about 3 months (around 10/03/2022).  Freddie Breech, DPM

## 2022-07-09 IMAGING — MG MM DIGITAL SCREENING BILAT W/ TOMO AND CAD
8 series · 8 of 24 positions shown · non-contrast
Comparison: Previous exam(s).

ACR Breast Density Category a: The breast tissue is almost entirely
fatty.

CLINICAL DATA: Screening.

EXAM:
DIGITAL SCREENING BILATERAL MAMMOGRAM WITH TOMOSYNTHESIS AND CAD
TECHNIQUE: Bilateral screening digital craniocaudal and mediolateral oblique
mammograms were obtained. Bilateral screening digital breast
tomosynthesis was performed. The images were evaluated with
computer-aided detection.

[R MLO synth-2D]
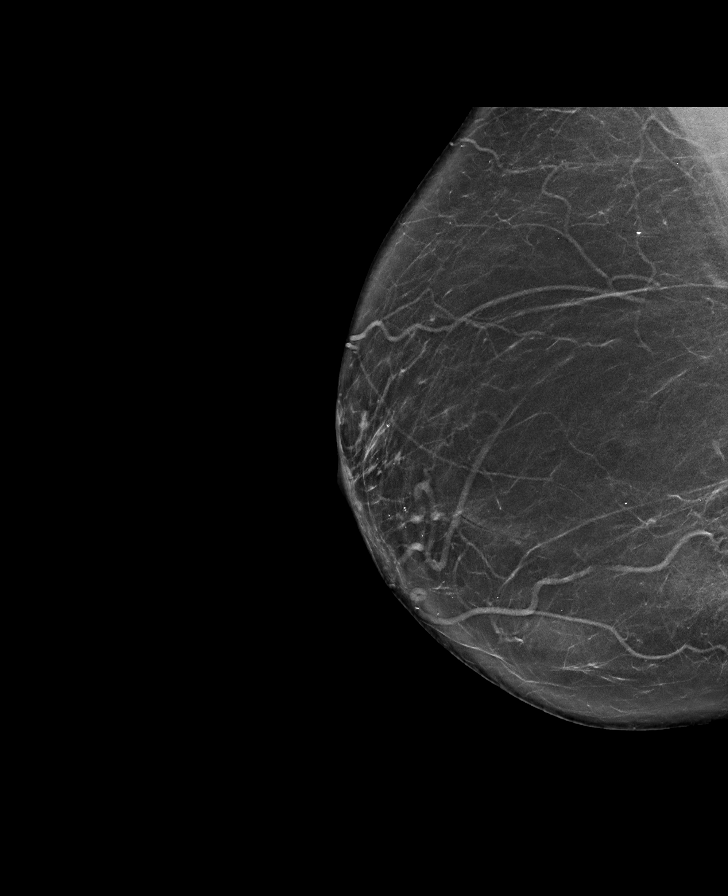

[L MLO synth-2D]
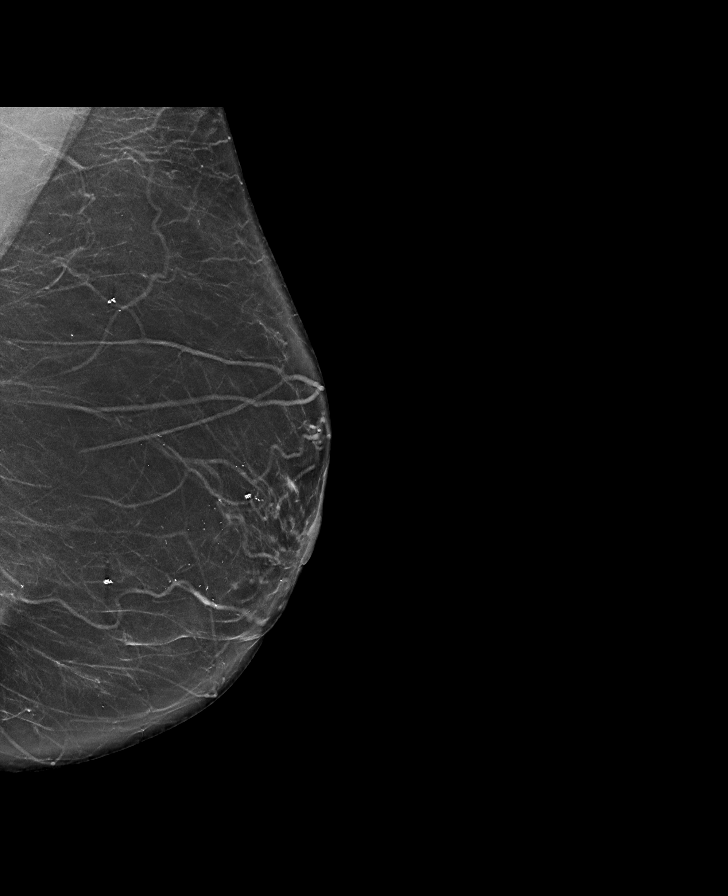

[R CC synth-2D]
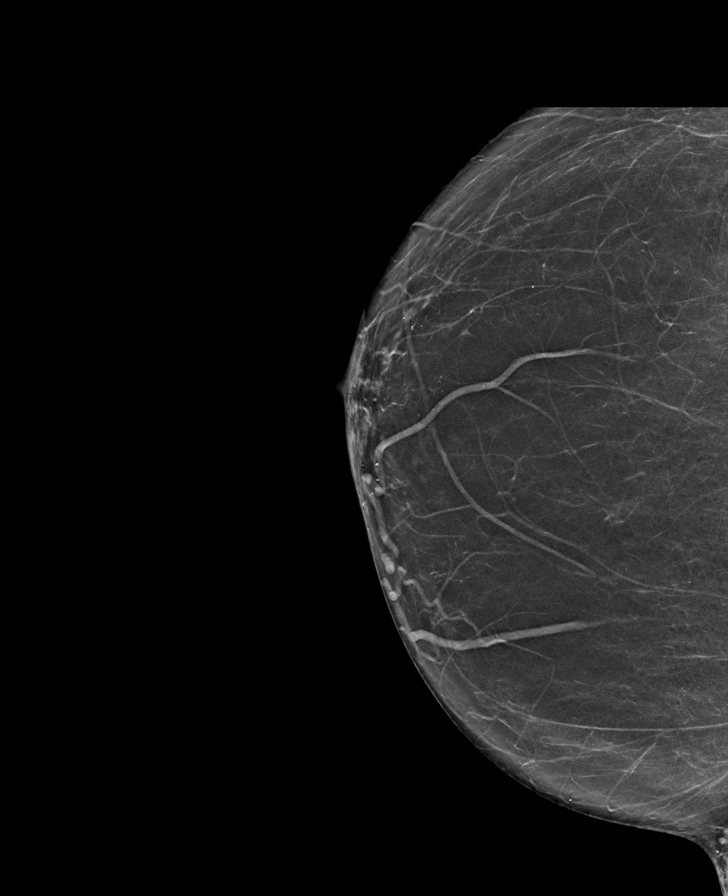

[L CC synth-2D]
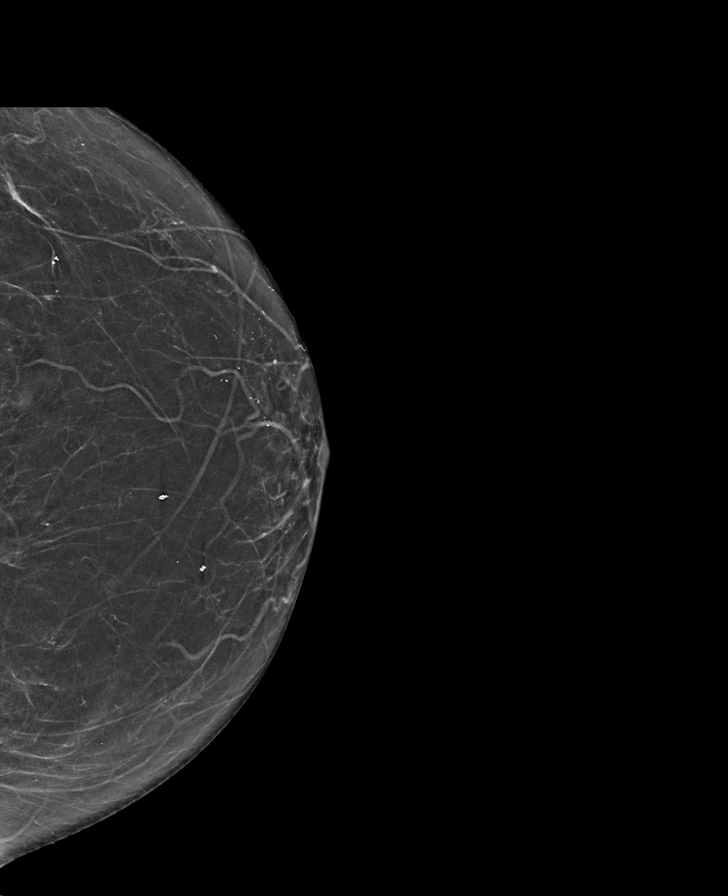

[R CC tomo · tomo slice 33/64.0]
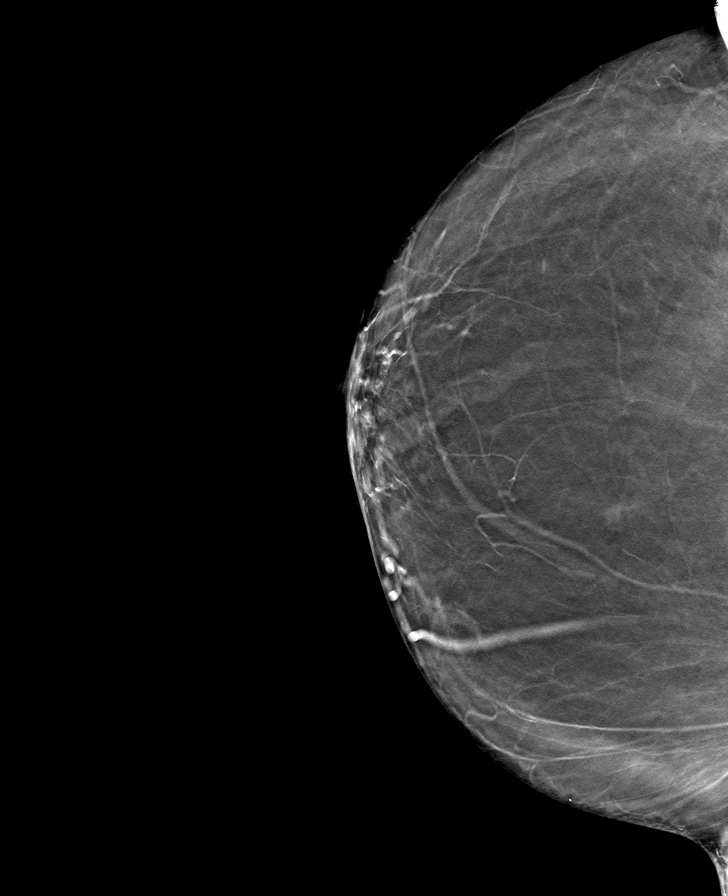

[R MLO tomo · tomo slice 41/80.0]
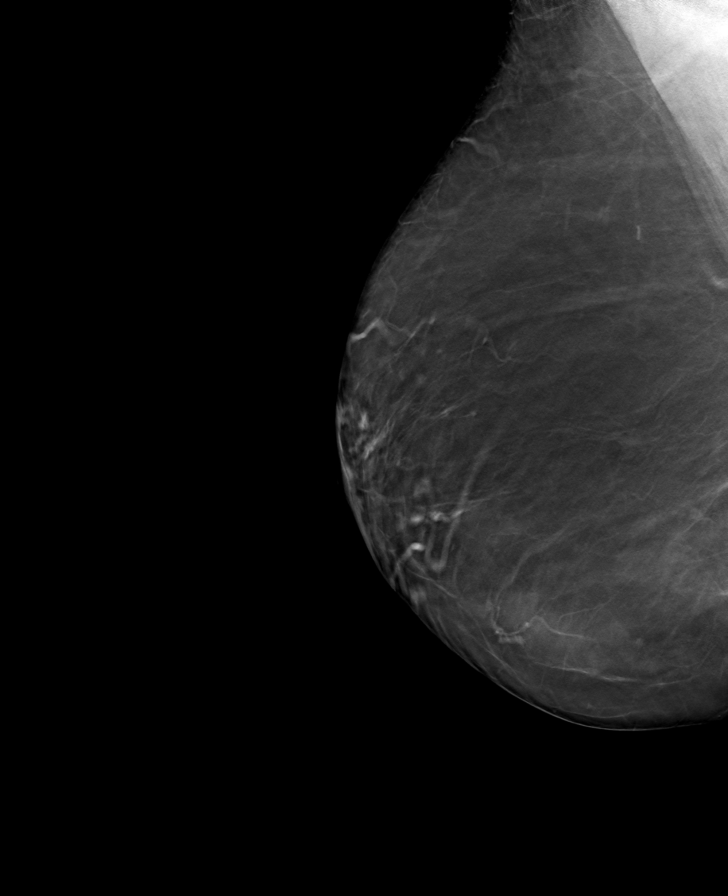

[L CC tomo · tomo slice 31/62.0]
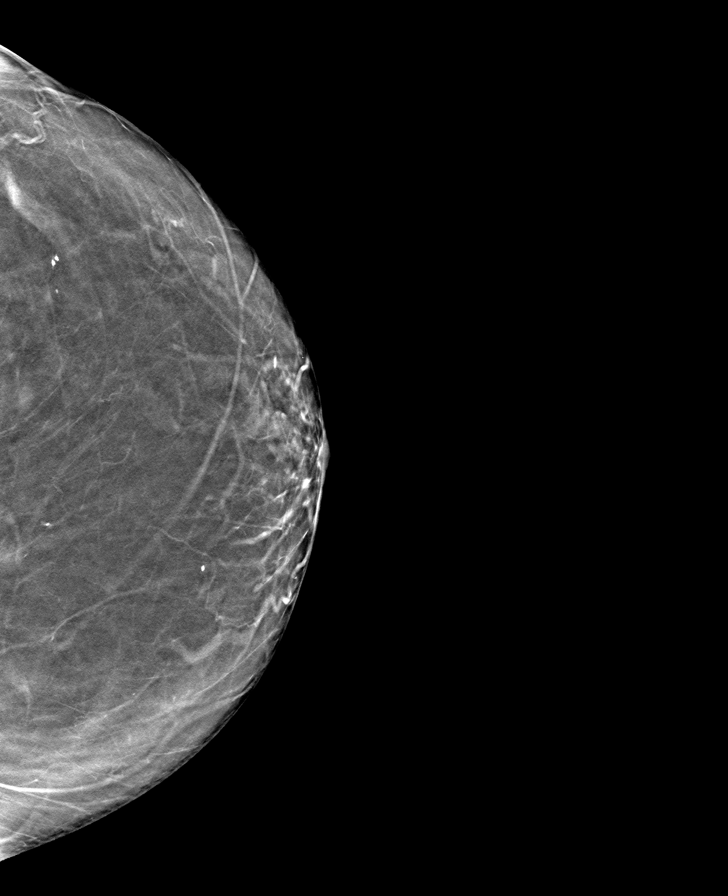

[L MLO tomo · tomo slice 37/74.0]
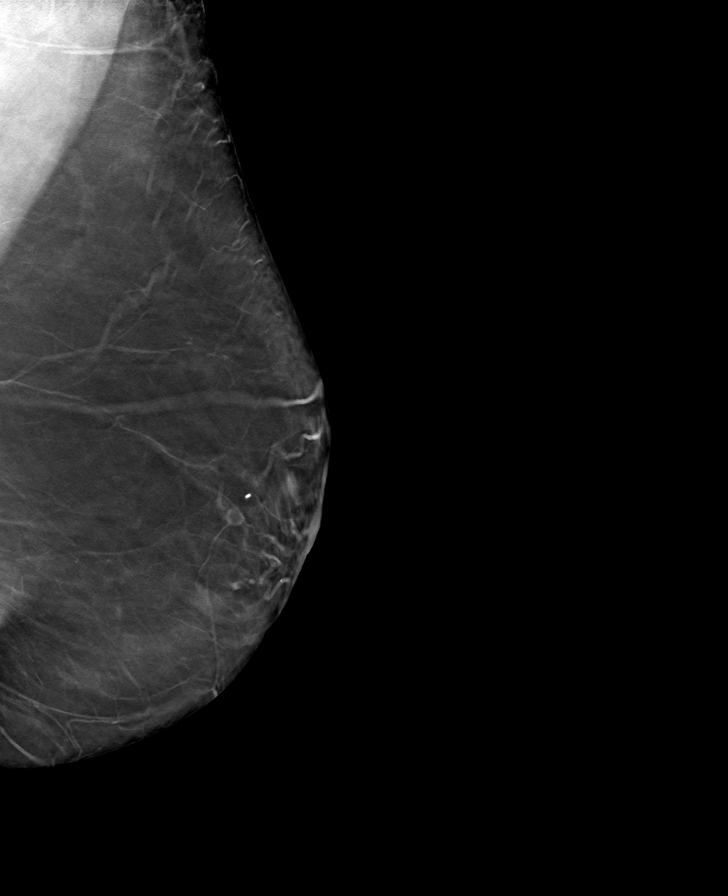

[8 of 24 positions shown; findings below may reference images not displayed]

FINDINGS: There are no findings suspicious for malignancy.
IMPRESSION: No mammographic evidence of malignancy. A result letter of this
screening mammogram will be mailed directly to the patient.

RECOMMENDATION:
Screening mammogram in one year. (Code:0E-3-N98)

BI-RADS CATEGORY  1: Negative.

## 2022-07-10 ENCOUNTER — Telehealth: Payer: Self-pay | Admitting: Family Medicine

## 2022-07-10 NOTE — Telephone Encounter (Signed)
[employer] called on patient behalf.  Patient needs all pill medication sent to [facility] Any other meds can be sent to [facility] mail in

## 2022-07-11 ENCOUNTER — Inpatient Hospital Stay: Payer: Medicare HMO

## 2022-07-11 VITALS — BP 149/53 | HR 62 | Temp 97.7°F | Resp 20

## 2022-07-11 DIAGNOSIS — F17210 Nicotine dependence, cigarettes, uncomplicated: Secondary | ICD-10-CM

## 2022-07-11 DIAGNOSIS — I10 Essential (primary) hypertension: Secondary | ICD-10-CM | POA: Diagnosis not present

## 2022-07-11 DIAGNOSIS — N1832 Chronic kidney disease, stage 3b: Secondary | ICD-10-CM

## 2022-07-11 DIAGNOSIS — D631 Anemia in chronic kidney disease: Secondary | ICD-10-CM | POA: Diagnosis not present

## 2022-07-11 DIAGNOSIS — D638 Anemia in other chronic diseases classified elsewhere: Secondary | ICD-10-CM

## 2022-07-11 DIAGNOSIS — Z79899 Other long term (current) drug therapy: Secondary | ICD-10-CM | POA: Diagnosis not present

## 2022-07-11 DIAGNOSIS — E1159 Type 2 diabetes mellitus with other circulatory complications: Secondary | ICD-10-CM

## 2022-07-11 DIAGNOSIS — Z794 Long term (current) use of insulin: Secondary | ICD-10-CM | POA: Diagnosis not present

## 2022-07-11 DIAGNOSIS — I129 Hypertensive chronic kidney disease with stage 1 through stage 4 chronic kidney disease, or unspecified chronic kidney disease: Secondary | ICD-10-CM | POA: Diagnosis not present

## 2022-07-11 LAB — CBC
HCT: 36.1 % (ref 36.0–46.0)
Hemoglobin: 10.3 g/dL — ABNORMAL LOW (ref 12.0–15.0)
MCH: 23.2 pg — ABNORMAL LOW (ref 26.0–34.0)
MCHC: 28.5 g/dL — ABNORMAL LOW (ref 30.0–36.0)
MCV: 81.3 fL (ref 80.0–100.0)
Platelets: 191 10*3/uL (ref 150–400)
RBC: 4.44 MIL/uL (ref 3.87–5.11)
RDW: 15.4 % (ref 11.5–15.5)
WBC: 6.2 10*3/uL (ref 4.0–10.5)
nRBC: 0 % (ref 0.0–0.2)

## 2022-07-11 MED ORDER — EPOETIN ALFA 10000 UNIT/ML IJ SOLN
10000.0000 [IU] | Freq: Once | INTRAMUSCULAR | Status: AC
  Administered 2022-07-11: 10000 [IU] via SUBCUTANEOUS
  Filled 2022-07-11: qty 1

## 2022-07-11 NOTE — Progress Notes (Signed)
Patient presents today for Retacrit injection. Hemoglobin reviewed prior to administration. VSS tolerated without incident or complaint. See MAR for details. Patient stable during and after injection. Patient discharged in satisfactory condition with no s/s of distress noted.  

## 2022-07-11 NOTE — Patient Instructions (Signed)
[name]  Discharge Instructions: Thank you for choosing [facility] to provide your oncology and hematology care.  If you have a lab appointment with the Cancer Center - please note that after April 8th, 2024, all labs will be drawn in the cancer center.  You do not have to check in or register with the main entrance as you have in the past but will complete your check-in in the cancer center.  Wear comfortable clothing and clothing appropriate for easy access to any Portacath or PICC line.   We strive to give you quality time with your provider. You may need to reschedule your appointment if you arrive late (15 or more minutes).  Arriving late affects you and other patients whose appointments are after yours.  Also, if you miss three or more appointments without notifying the office, you may be dismissed from the clinic at the provider's discretion.      For prescription refill requests, have your pharmacy contact our office and allow 72 hours for refills to be completed.    Today you received the following Retacrit, return as scheduled.   To help prevent nausea and vomiting after your treatment, we encourage you to take your nausea medication as directed.  BELOW ARE SYMPTOMS THAT SHOULD BE REPORTED IMMEDIATELY: *FEVER GREATER THAN 100.4 F (38 C) OR HIGHER *CHILLS OR SWEATING *NAUSEA AND VOMITING THAT IS NOT CONTROLLED WITH YOUR NAUSEA MEDICATION *UNUSUAL SHORTNESS OF BREATH *UNUSUAL BRUISING OR BLEEDING *URINARY PROBLEMS (pain or burning when urinating, or frequent urination) *BOWEL PROBLEMS (unusual diarrhea, constipation, pain near the anus) TENDERNESS IN MOUTH AND THROAT WITH OR WITHOUT PRESENCE OF ULCERS (sore throat, sores in mouth, or a toothache) UNUSUAL RASH, SWELLING OR PAIN  UNUSUAL VAGINAL DISCHARGE OR ITCHING   Items with * indicate a potential emergency and should be followed up as soon as possible or go to the Emergency Department if  any problems should occur.  Please show the CHEMOTHERAPY ALERT CARD or IMMUNOTHERAPY ALERT CARD at check-in to the Emergency Department and triage nurse.  Should you have questions after your visit or need to cancel or reschedule your appointment, please contact [name] [phone]  and follow the prompts.  Office hours are 8:00 a.m. to 4:30 p.m. Monday - Friday. Please note that voicemails left after 4:00 p.m. may not be returned until the following business day.  We are closed weekends and major holidays. You have access to a nurse at all times for urgent questions. Please call the main number to the clinic [phone] and follow the prompts.  For any non-urgent questions, you may also contact your provider using MyChart. We now offer e-Visits for anyone 18 and older to request care online for non-urgent symptoms. For details visit [email or web address].   Also download the MyChart app! Go to the app store, search "MyChart", open the app, select [facility], and log in with your MyChart username and password.   

## 2022-07-12 DIAGNOSIS — J449 Chronic obstructive pulmonary disease, unspecified: Secondary | ICD-10-CM | POA: Diagnosis not present

## 2022-07-12 DIAGNOSIS — I10 Essential (primary) hypertension: Secondary | ICD-10-CM | POA: Diagnosis not present

## 2022-07-12 NOTE — Telephone Encounter (Signed)
Pt called in regards to back brace. Please follow up with pt.

## 2022-07-13 NOTE — Telephone Encounter (Signed)
We do not prescribe back braces. Would need to discuss with ortho

## 2022-07-17 ENCOUNTER — Ambulatory Visit (INDEPENDENT_AMBULATORY_CARE_PROVIDER_SITE_OTHER): Payer: Medicare HMO | Admitting: Internal Medicine

## 2022-07-17 ENCOUNTER — Encounter: Payer: Self-pay | Admitting: Internal Medicine

## 2022-07-17 VITALS — BP 134/84 | HR 71 | Ht 59.0 in | Wt 169.1 lb

## 2022-07-17 DIAGNOSIS — Z0000 Encounter for general adult medical examination without abnormal findings: Secondary | ICD-10-CM | POA: Diagnosis not present

## 2022-07-17 NOTE — Progress Notes (Signed)
Subjective:   [name] is a 72 y.o. female who presents for Medicare Annual (Subsequent) preventive examination.  Review of Systems    Review of Systems  All other systems reviewed and are negative.   Objective:    Today's Vitals   07/17/22 1433 07/17/22 1434 07/17/22 1444  BP: (!) 158/60 134/84   Pulse: 71    SpO2: 94%    Weight: 169 lb 1.9 oz (76.7 kg)    Height: 4\' 11"  (1.499 m)    PainSc: 0-No pain  6    Body mass index is 34.16 kg/m.     07/17/2022    2:48 PM 07/11/2022   11:25 AM 06/23/2022    8:37 AM 06/21/2022   11:22 AM 05/30/2022   10:38 AM 05/12/2022   11:08 AM 04/21/2022   10:48 AM  Advanced Directives  Does Patient Have a Medical Advance Directive? Yes Yes Yes Yes Yes Yes Yes  Type of Special educational needs teacher of [place];Living will Healthcare Power of [place];Living will Healthcare Power of [place];Living will Healthcare Power of [employer];Living will Healthcare Power of [employer] Power of Attorney  Does patient want to make changes to medical advance directive?  No - Patient declined  No - Patient declined   No - Patient declined  Copy of Healthcare Power of Attorney in Chart?  No - copy requested No - copy requested No - copy requested Yes - validated most recent copy scanned in chart (See row information)  No - copy requested  Would patient like information on creating a medical advance directive?  No - Patient declined No - Patient declined No - Patient declined No - Patient declined No - Patient declined No - Patient declined    Current Medications (verified) Outpatient Encounter Medications as of 07/17/2022  Medication Sig   ACCU-CHEK GUIDE test strip USE TO CHECK BLOOD SUGAR FOUR TIMES A DAY AND AS NEEDED (NEED MD APPOINTMENT FOR ANY FURTHER REFILLS)   Accu-Chek Softclix Lancets lancets TEST BLOOD SUGAR THREE TIMES DAILY AS DIRECTED   acetaminophen (TYLENOL) 325 MG tablet Take 650 mg by mouth every 6 (six) hours as needed.    albuterol (PROVENTIL) (2.5 MG/3ML) 0.083% nebulizer solution Take 3 mLs (2.5 mg total) by nebulization every 6 (six) hours as needed for wheezing or shortness of breath.   albuterol (VENTOLIN HFA) 108 (90 Base) MCG/ACT inhaler INHALE 1 TO 2 PUFFS EVERY 6 HOURS AS NEEDED FOR WHEEZING, SHORTNESS OF BREATH (Patient taking differently: Inhale 1-2 puffs into the lungs every 6 (six) hours as needed for wheezing or shortness of breath.)   Alcohol Swabs (DROPSAFE ALCOHOL PREP) 70 % PADS USE TO CLEAN FINGER PRIOR TO TESTING FOR BLOOD SUGAR AS DIRECTED   alendronate (FOSAMAX) 70 MG tablet TAKE 1 TABLET EVERY 7 DAYS ON AN EMPTY STOMACH WITH A FULL GLASS OF WATER (Patient taking differently: Take 70 mg by mouth once a week.)   amLODipine (NORVASC) 10 MG tablet TAKE 1 TABLET EVERY DAY   ascorbic acid (VITAMIN C) 500 MG tablet Take 500 mg by mouth daily.   aspirin EC 81 MG tablet Take 1 tablet (81 mg total) by mouth daily with breakfast.   benzonatate (TESSALON) 200 MG capsule Take 1 capsule (200 mg total) by mouth 3 (three) times daily as needed for cough.   Blood Glucose Calibration (ACCU-CHEK GUIDE CONTROL) LIQD USE AS DIRECTED   blood glucose meter kit and supplies Dispense based on patient and insurance preference. Use up to  four times daily as directed. (FOR ICD-10 E10.9, E11.9).   buPROPion (WELLBUTRIN XL) 150 MG 24 hr tablet Take 1 tablet (150 mg total) by mouth every morning.   Calcium Carb-Cholecalciferol (CALTRATE 600+D3 SOFT) 600-20 MG-MCG CHEW Chew 1 tablet by mouth daily at 12 noon.   cetirizine (ZYRTEC) 10 MG tablet Take 1 tablet (10 mg total) by mouth daily.   Cholecalciferol (D3 PO) Take 1 tablet by mouth daily.   clobetasol (TEMOVATE) 0.05 % external solution Apply 1 Application topically 2 (two) times daily.   Continuous Blood Gluc Sensor (FREESTYLE LIBRE 14 DAY SENSOR) MISC 1 each by Does not apply route every 14 (fourteen) days. Change every 2 weeks (Patient not taking: Reported on 07/07/2022)    diazepam (VALIUM) 5 MG tablet Take 1 tablet (5 mg total) by mouth once as needed (MRI claustrophobia).   Docusate Sodium (DSS) 100 MG CAPS Take 1 tablet by mouth in the morning and at bedtime.   DROPLET PEN NEEDLES 31G X 8 MM MISC USE FOR INJECTING INSULIN 4 TIMES DAILY.   Elastic Bandages & Supports (ADJUSTABLE ARM SLING) MISC L arm sling   ezetimibe (ZETIA) 10 MG tablet TAKE 1 TABLET EVERY DAY   hydrOXYzine (ATARAX) 10 MG tablet Take one tablet by mouth once daily, as needed, for excessive itching   insulin aspart (NOVOLOG FLEXPEN) 100 UNIT/ML FlexPen Inject 5 Units into the skin 3 (three) times daily with meals. Give if eats 50% or more of meal. (Patient taking differently: Inject 14 Units into the skin 3 (three) times daily with meals. Give if eats 50% or more of meal.)   ipratropium (ATROVENT) 0.03 % nasal spray Place 2 sprays into both nostrils every 12 (twelve) hours.   lactobacillus acidophilus (BACID) TABS tablet Take 2 tablets by mouth 3 (three) times daily.   lamoTRIgine (LAMICTAL) 100 MG tablet Take 1 tablet (100 mg total) by mouth 2 (two) times daily.   levothyroxine (SYNTHROID) 50 MCG tablet TAKE 1 TABLET DAILY SIX DAYS A WEEK AND 1/2 TABLET ON ONE DAY A WEEK   LORazepam (ATIVAN) 0.5 MG tablet Take 1 tablet (0.5 mg total) by mouth 2 (two) times daily.   methocarbamol (ROBAXIN-750) 750 MG tablet Take 1 tablet (750 mg total) by mouth every 8 (eight) hours as needed for muscle spasms.   metoprolol tartrate (LOPRESSOR) 50 MG tablet TAKE 1 TABLET TWICE DAILY (NEED MD APPOINTMENT) (Patient taking differently: Take 50 mg by mouth 2 (two) times daily.)   Misc. Devices (MATTRESS PAD) MISC FIRM MATTRESS PAD for hospital bed x 1   montelukast (SINGULAIR) 10 MG tablet TAKE 1 TABLET EVERY DAY   Multiple Vitamins-Minerals (MULTIVITAMIN GUMMIES ADULT PO) Take 1 tablet by mouth daily.   MYRBETRIQ 25 MG TB24 tablet TAKE 1 TABLET EVERY DAY   nicotine (NICODERM CQ) 21 mg/24hr patch Place 1 patch (21  mg total) onto the skin daily. (Patient not taking: Reported on 07/07/2022)   Omega-3 Fatty Acids (FISH OIL PO) Take 1 capsule by mouth daily.   oxyCODONE-acetaminophen (PERCOCET) 10-325 MG tablet Take 1 tablet by mouth every 8 (eight) hours as needed for pain.   potassium chloride (KLOR-CON) 10 MEQ tablet Take 1 tablet (10 mEq total) by mouth 2 (two) times daily.   pregabalin (LYRICA) 75 MG capsule Take 1 capsule (75 mg total) by mouth daily.   RABEprazole (ACIPHEX) 20 MG tablet TAKE 1 TABLET TWICE DAILY   RESTASIS 0.05 % ophthalmic emulsion Place 2 drops into both eyes daily.  risperiDONE (RISPERDAL) 0.5 MG tablet Take 1 tablet (0.5 mg total) by mouth at bedtime.   rosuvastatin (CRESTOR) 5 MG tablet TAKE 1 TABLET AT BEDTIME   sertraline (ZOLOFT) 100 MG tablet Take 1 tablet (100 mg total) by mouth every morning.   SPIKEVAX injection    spironolactone (ALDACTONE) 50 MG tablet TAKE 1 TABLET (50 MG TOTAL) BY MOUTH DAILY. DISCONTINUE SPIRONOLACTONE 25MG    telmisartan (MICARDIS) 20 MG tablet Take 10 mg by mouth daily.   TOUJEO MAX SOLOSTAR 300 UNIT/ML Solostar Pen INJECT 20-26 UNITS INTO THE SKIN DAILY.   traZODone (DESYREL) 150 MG tablet Take 1 tablet (150 mg total) by mouth at bedtime.   umeclidinium-vilanterol (ANORO ELLIPTA) 62.5-25 MCG/ACT AEPB Inhale 1 puff into the lungs daily.   vitamin E 180 MG (400 UNITS) capsule Take 400 Units by mouth daily.   No facility-administered encounter medications on file as of 07/17/2022.    Allergies (verified) Iron, Milk (cow), Penicillins, Phenazopyridine, Phenazopyridine hcl, Cephalexin, Flonase [fluticasone], Milk-related compounds, and Phenazopyridine hcl   History: Past Medical History:  Diagnosis Date   Allergy    Anemia    Anemia in chronic kidney disease (CKD) 08/10/2021   Anemia in chronic kidney disease (CKD) 10/12/2021   Anxiety    takes Ativan daily   Arthritis    Assistance needed for mobility    Bipolar disorder (HCC)    takes  Risperdal nightly   Blood transfusion    Brain tumor (HCC)    Cancer (HCC)    In her gum   Carpal tunnel syndrome of right wrist 05/23/2011   Cervical disc disorder with radiculopathy of cervical region 10/31/2012   Chronic back pain    Chronic idiopathic constipation    Chronic neck and back pain    Colon polyps    COPD (chronic obstructive pulmonary disease) with chronic bronchitis 09/16/2013   Office Spirometry 10/30/2013-submaximal effort based on appearance of loop and curve. Numbers would fit with severe restriction but her physiologic capability may be better than this. FVC 0.91/44%, and 10.74/45%, FEV1/FVC 0.81, FEF 25-75% 1.43/69%     Diabetes mellitus    Type II   Diverticulosis    TCS 9/08 by Dr. Lina Sar for diarrhea . Bx for micro scopic colitis negative.    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   Metabolic encephalopathy 08/03/2011   Migraines    chronic headaches   Mononeuritis lower limb    Narcolepsy    Osteoporosis    Pancreatitis 2006   due to Depakote with normal EUS    Paralysis (HCC)    Schatzki's ring    non critical / EGD with ED 8/2011with RMR   Seizures (HCC)    takes Lamictal daily.Last seizure 3 yrs ago   Sleep apnea    on CPAP   Small bowel obstruction (HCC)    Stroke (HCC)    left sided weakness, speech changes   Tubular  adenoma of colon    Past Surgical History:  Procedure Laterality Date   ABDOMINAL HYSTERECTOMY  1978   BACK SURGERY  July 2012   BACTERIAL OVERGROWTH  TEST N/A 05/05/2013   Procedure: BACTERIAL OVERGROWTH TEST;  Surgeon: Corbin Ade, MD;  Location: [facility];  Service: Endoscopy;  Laterality: N/A;  7:30   BIOPSY THYROID  2009   BRAIN SURGERY  11/2011   resection of meningioma   BREAST REDUCTION SURGERY  1994   CARDIAC CATHETERIZATION  05/10/2005   normal coronaries, normal LV systolic function and EF (Dr. Evlyn Courier)   CARPAL TUNNEL RELEASE Left 07/22/04   Dr. Romeo Apple   CATARACT EXTRACTION Bilateral    CHOLECYSTECTOMY  1984   COLONOSCOPY N/A 09/25/2012   NWG:NFAOZHY diverticulosis.  colonic polyp-removed : tubular adenoma   CRANIOTOMY  11/23/2011   Procedure: CRANIOTOMY TUMOR EXCISION;  Surgeon: Hewitt Shorts, MD;  Location: [facility];  Service: Neurosurgery;  Laterality: N/A;  Craniotomy for tumor resection   ESOPHAGOGASTRODUODENOSCOPY  12/29/2010   Rourk-Retained food in the esophagus and stomach, small hiatal hernia, status post Maloney dilation of the esophagus   ESOPHAGOGASTRODUODENOSCOPY N/A 09/25/2012   QMV:HQIONGEX atonic baggy esophagus status post Maloney dilation 56 F. Hiatal hernia   GIVENS CAPSULE STUDY N/A 01/15/2013   NORMAL.    IR GENERIC HISTORICAL  03/17/2016   IR RADIOLOGIST EVAL & MGMT 03/17/2016 [facility]   LESION REMOVAL N/A 05/31/2015   Procedure: REMOVAL RIGHT AND LEFT LESIONS OF MANDIBLE;  Surgeon: Ocie Doyne, DDS;  Location: [facility] OR;  Service: Oral Surgery;  Laterality: N/A;   MALONEY DILATION  12/29/2010   RMR;   NM MYOCAR PERF WALL MOTION  2006   "relavtiely normal" persantine, mild anterior thinning (breast attenuation artifact), no region of scar/ischemia   OVARIAN CYST REMOVAL     RECTOCELE REPAIR N/A 06/29/2015   Procedure: POSTERIOR REPAIR (RECTOCELE);  Surgeon: Tilda Burrow, MD;  Location: [facility] ORS;  Service: Gynecology;  Laterality: N/A;   REDUCTION MAMMAPLASTY Bilateral    SPINE SURGERY  09/29/2010   Dr. Shon Baton   surgical excision of 3 tumors from right thigh and right buttock  and left  upper thigh  2010   TOOTH EXTRACTION Bilateral 12/14/2014   Procedure: REMOVAL OF BILATERAL MANDIBULAR EXOSTOSES;  Surgeon: Ocie Doyne, DDS;  Location: [facility] OR;  Service: Oral Surgery;  Laterality: Bilateral;   TRANSTHORACIC ECHOCARDIOGRAM  2010   EF 60-65%, mild conc LVH, grade 1 diastolic dysfunction; mildly calcified MV annulus with mildly thickened leaflets, mildly calcified MR annulus   Family History  Problem Relation Age of Onset   Heart attack Mother        HTN   Pneumonia Father    Kidney failure Father    Diabetes Father    Pancreatic cancer Sister    Cancer Sister        breast    Cancer Sister        pancreatic   Diabetes Brother    Hypertension Brother    Diabetes Brother    Alcohol abuse Maternal Uncle    Stroke Maternal Grandmother    Heart attack Maternal Grandfather    Hypertension Son    Sleep apnea Son    Colon cancer Neg Hx    Anesthesia problems Neg Hx    Hypotension Neg Hx    Malignant hyperthermia Neg Hx    Pseudochol deficiency Neg Hx    Breast cancer Neg Hx    Stomach cancer  Neg Hx    Social History   Socioeconomic History   Marital status: Legally Separated    Spouse name: Not on file   Number of children: 1   Years of education: 12   Highest education level: High school graduate  Occupational History   Occupation: Disabled  Tobacco Use   Smoking status: Some Days    Packs/day: 0.25    Years: 30.00    Additional pack years: 0.00    Total pack years: 7.50    Types: Cigarettes    Last attempt to quit: 07/05/2021    Years since quitting: 1.0    Passive exposure: Past   Smokeless tobacco: Never   Tobacco comments:    3-4 a day   Vaping Use   Vaping Use: Never used  Substance and Sexual Activity   Alcohol use: No    Alcohol/week: 0.0 standard drinks of alcohol    Comment:     Drug use: No   Sexual activity: Not Currently  Other Topics Concern   Not on file  Social History Narrative   01/29/18 Lives alone, has 3 aides, Mon- Fri 8  hrs, 2 hrs on Sat-Sun, RN manages her meds   Caffeine use: Drink coffee sometimes    Right handed    Social Determinants of Health   Financial Resource Strain: Low Risk  (07/17/2022)   Overall Financial Resource Strain (CARDIA)    Difficulty of Paying Living Expenses: Not very hard  Food Insecurity: No Food Insecurity (07/17/2022)   Hunger Vital Sign    Worried About Running Out of Food in the Last Year: Never true    Ran Out of Food in the Last Year: Never true  Transportation Needs: No Transportation Needs (07/17/2022)   PRAPARE - Administrator, Civil Service (Medical): No    Lack of Transportation (Non-Medical): No  Physical Activity: Insufficiently Active (07/17/2022)   Exercise Vital Sign    Days of Exercise per Week: 2 days    Minutes of Exercise per Session: 20 min  Stress: No Stress Concern Present (07/17/2022)   [employer] of Occupational Health - Occupational Stress Questionnaire    Feeling of Stress : Not at all  Social Connections: Moderately Integrated (07/17/2022)   Social Connection and Isolation Panel [NHANES]    Frequency of Communication with Friends and Family: Three times a week    Frequency of Social Gatherings with Friends and Family: Once a week    Attends Religious Services: More than 4 times per year    Active Member of [employer] or Organizations: Yes    Attends Engineer, structural: More than 4 times per year    Marital Status: Divorced    Tobacco Counseling Ready to quit: Not Answered Counseling given: Not Answered Tobacco comments: 3-4 a day    Clinical Intake:  Pre-visit preparation completed: Yes  Pain : 0-10 Pain Score: 6  Pain Type: Chronic pain Pain Location: Back Pain Orientation: Right Pain Descriptors / Indicators: Aching Pain Onset: More than a month ago Pain Frequency: Constant        How often do you need to have someone help you when you read instructions, pamphlets, or other written materials from your  doctor or pharmacy?: 1 - Never What is the last grade level you completed in school?: semester of technical college    Activities of Daily Living    07/17/2022    2:51 PM 07/17/2022    2:25 PM  In your present  state of health, do you have any difficulty performing the following activities:  Hearing? 0 0  Vision? 0 0  Difficulty concentrating or making decisions? 1 1  Walking or climbing stairs? 1 1  Dressing or bathing? 1 1  Doing errands, shopping? 1 1  Preparing Food and eating ?  Y  Using the Toilet?  Y  In the past six months, have you accidently leaked urine?  Y  Do you have problems with loss of bowel control?  N  Managing your Medications?  Y  Managing your Finances?  N  Housekeeping or managing your Housekeeping?  Y    Patient Care Team: Kerri Perches, MD as PCP - General (Family Medicine) Quintella Reichert, MD as PCP - Cardiology (Cardiology) Jena Gauss Gerrit Friends, MD (Gastroenterology) Waymon Budge, MD as Consulting Physician (Pulmonary Disease) Shirlean Kelly, MD as Consulting Physician (Neurosurgery) Sheran Luz, MD as Consulting Physician (Physical Medicine and Rehabilitation) Drema Dallas, DO as Consulting Physician (Neurology) Janalyn Harder, MD (Inactive) as Consulting Physician (Dermatology) Newman Pies, MD as Consulting Physician (Otolaryngology) Ranee Gosselin, MD as Consulting Physician (Orthopedic Surgery) Myrlene Broker, MD as Consulting Physician (Behavioral Health) Venita Lick, MD as Consulting Physician (Orthopedic Surgery) Jodelle Gross, NP as Nurse Practitioner (Nurse Practitioner) Rachael Fee, MD as Attending Physician (Gastroenterology) Pyrtle, Carie Caddy, MD as Consulting Physician (Gastroenterology) Theressa Millard, PT as Physical Therapist (Physical Therapy) Fransisco Hertz, MD as Consulting Physician (Vascular Surgery) Merwyn Katos, DPM as Consulting Physician (Podiatry) Nadara Mustard, MD as Consulting Physician (Orthopedic  Surgery) Janalyn Harder, MD (Inactive) as Consulting Physician (Dermatology) Gavin Pound, [facility] (Inactive) (Pharmacist) Quintella Reichert, MD as Consulting Physician (Cardiology) Audrie Gallus, RN as [facility] Care Management  Indicate any recent Medical Services you may have received from other than [facility] providers in the past year (date may be approximate).     Assessment:   This is a routine wellness examination for [place].  Hearing/Vision screen No results found.  Dietary issues and exercise activities discussed:     Goals Addressed             This Visit's Progress    Eat Healthy       Notes: pt would like to work on her overall health.        Depression Screen    07/17/2022    2:50 PM 07/17/2022    2:30 PM 07/17/2022    2:29 PM 05/25/2022    1:07 PM 05/11/2022    1:29 PM 04/27/2022    1:44 PM 04/26/2022    2:18 PM  PHQ 2/9 Scores  PHQ - 2 Score 2 0 0   0   PHQ- 9 Score            Information is confidential and restricted. Go to Review Flowsheets to unlock data.    Fall Risk    07/17/2022    2:49 PM 07/17/2022    2:30 PM 04/27/2022    1:44 PM 04/13/2022    1:36 PM 03/09/2022    1:13 PM  Fall Risk   Falls in the past year? 1 0 1 1   Number falls in past yr: 1 0 1 0 1  Injury with Fall? 0 0 1 0 0  Risk for fall due to :  No Fall Risks History of fall(s);Impaired balance/gait History of fall(s);Impaired balance/gait History of fall(s);Impaired balance/gait  Follow up  Falls evaluation completed Falls evaluation completed Falls evaluation  completed Falls evaluation completed    FALL RISK PREVENTION PERTAINING TO THE HOME:  Any stairs in or around the home? No  If so, are there any without handrails? No  Home free of loose throw rugs in walkways, pet beds, electrical cords, etc? Yes  Adequate lighting in your home to reduce risk of falls? Yes   ASSISTIVE DEVICES UTILIZED TO PREVENT FALLS:  Life alert? Yes  Use of a cane, walker or w/c?  Yes  Grab bars in the bathroom? Yes  Shower chair or bench in shower? Yes  Elevated toilet seat or a handicapped toilet? Yes     Cognitive Function:    11/24/2020   11:44 AM 02/03/2019    2:25 PM 09/08/2015    8:37 AM 07/02/2014    9:50 AM 12/08/2013   10:28 AM  MMSE - Mini Mental State Exam  Orientation to time 5 5 5 5 5   Orientation to Place 5 5 3 5 5   Registration 3 3 2 3 2   Attention/ Calculation 0 5 5 3 5   Recall 2 1 2 3 2   Language- name 2 objects 2 2 2 2 2   Language- repeat 1 1 0 1 1  Language- follow 3 step command 3 3 3 3 3   Language- read & follow direction 1 1 1 1 1   Write a sentence 1 1 1  0 1  Copy design 0 0 0 0 0  Total score 23 27 24 26 27         07/17/2022    2:30 PM 07/07/2020    1:42 PM 07/07/2020    1:39 PM 07/07/2019    4:28 PM 06/12/2018    2:10 PM  6CIT Screen  What Year? 0 points 0 points 0 points 0 points 0 points  What month? 0 points 0 points 0 points 0 points 0 points  What time? 0 points 0 points  0 points 0 points  Count back from 20 0 points 2 points  0 points 0 points  Months in reverse 2 points 4 points  0 points 0 points  Repeat phrase 2 points 4 points  0 points 4 points  Total Score 4 points 10 points  0 points 4 points    Immunizations Immunization History  Administered Date(s) Administered   Fluad Quad(high Dose 65+) 01/01/2019, 12/11/2019, 11/10/2020, 12/23/2021   H1N1 01/09/2008   Influenza Split 12/12/2010, 11/16/2011   Influenza Whole 11/23/2006, 12/10/2007, 12/11/2008, 11/16/2009   Influenza,inj,Quad PF,6+ Mos 11/28/2012, 11/20/2013, 11/10/2014, 12/06/2015, 11/21/2016, 11/20/2017   Influenza,inj,quad, With Preservative 12/11/2016, 01/07/2018   Moderna Sars-Covid-2 Vaccination 04/27/2019, 05/25/2019, 02/16/2020, 02/24/2022   Pneumococcal Conjugate-13 09/16/2013   Pneumococcal Polysaccharide-23 08/03/2003, 12/11/2008, 08/22/2010, 08/31/2015   Td 09/23/2003   Td (Adult), 2 Lf Tetanus Toxid, Preservative Free 09/23/2003   Tdap  12/29/2011, 07/14/2022   Zoster Recombinat (Shingrix) 02/12/2019, 07/03/2019    TDAP status: Up to date  Flu Vaccine status: Up to date  Pneumococcal vaccine status: Up to date  Covid-19 vaccine status: Completed vaccines  Qualifies for Shingles Vaccine? Yes   Zostavax completed No   Shingrix Completed?: Yes  Screening Tests Health Maintenance  Topic Date Due   COVID-19 Vaccine (5 - 2023-24 season) 04/21/2022   INFLUENZA VACCINE  10/12/2022   HEMOGLOBIN A1C  10/19/2022   FOOT EXAM  12/07/2022   Diabetic kidney evaluation - Urine ACR  03/10/2023   Medicare Annual Wellness (AWV)  03/10/2023   OPHTHALMOLOGY EXAM  05/27/2023   Diabetic kidney evaluation - eGFR  measurement  06/29/2023   MAMMOGRAM  12/29/2023   DTaP/Tdap/Td (4 - Td or Tdap) 07/13/2032   Pneumonia Vaccine 15+ Years old  Completed   DEXA SCAN  Completed   Hepatitis C Screening  Completed   Zoster Vaccines- Shingrix  Completed   HPV VACCINES  Aged Out   COLONOSCOPY (Pts 45-96yrs Insurance coverage will need to be confirmed)  Discontinued    Health Maintenance  Health Maintenance Due  Topic Date Due   COVID-19 Vaccine (5 - 2023-24 season) 04/21/2022    Colorectal cancer screening: No longer required.   Mammogram status: Completed 12/28/2021. Repeat two years  Bone Density status: Completed 02/13/2019. Results reflect: Bone density results: OSTEOPOROSIS.   Lung Cancer Screening: (Low Dose CT Chest recommended if Age 27-80 years, 30 pack-year currently smoking OR have quit w/in 15years.) does not qualify.    Additional Screening:  Hepatitis C Screening: does not qualify; Completed 12/29/2014  Vision Screening: Recommended annual ophthalmology exams for early detection of glaucoma and other disorders of the eye. Is the patient up to date with their annual eye exam?  Yes  Who is the provider or what is the name of the office in which the patient attends annual eye exams? [facility]  If pt is  not established with a provider, would they like to be referred to a provider to establish care? No .   Dental Screening: Recommended annual dental exams for proper oral hygiene  Community Resource Referral / Chronic Care Management: CRR required this visit?  No   CCM required this visit?  No      Plan:     I have personally reviewed and noted the following in the patient's chart:   Medical and social history Use of alcohol, tobacco or illicit drugs  Current medications and supplements including opioid prescriptions. Patient is not currently taking opioid prescriptions. Functional ability and status Nutritional status Physical activity Advanced directives List of other physicians Hospitalizations, surgeries, and ER visits in previous 12 months Vitals Screenings to include cognitive, depression, and falls Referrals and appointments  In addition, I have reviewed and discussed with patient certain preventive protocols, quality metrics, and best practice recommendations. A written personalized care plan for preventive services as well as general preventive health recommendations were provided to patient.     Milus Banister, MD   07/17/2022

## 2022-07-17 NOTE — Patient Instructions (Signed)
[name] , Thank you for taking time to come for your Medicare Wellness Visit. I appreciate your ongoing commitment to your health goals. Please review the following plan we discussed and let me know if I can assist you in the future.   These are the goals we discussed:  Goals      CCM (DIABETES) EXPECTED OUTCOME: MONITOR, SELF-MANAGE AND REDUCE SYMPTOMS OF DIABETES     Current Barriers:  Knowledge Deficits related to Diabetes Chronic Disease Management support and education needs related to management of Diabetes, diet Patient reports she lives alone, has CAP aide 8 hours per day/ 7 days per week and she provides transportation as well as church members assist, patient reports she checks CBG QID with fasting ranges low 100's recently with reading 140 today, random readings in 100's, (states no low readings recently)  (has [employer] and states she has decided not to use) feels this is too much trouble, Patient does not give permission for RN care manager to speak with aide.  Patient reports she has had no recent falls since August 2023. Patient states nurse from [facility] no longer prefills medications due to [facility] no longer pays for this service, pt reports she is taking as prescribed.  Pt has needed DME - WC, cane, walker, hospital bed. Patient reports she now has CPAP machine and is using nightly without any issues  Planned Interventions: Reviewed medications with patient and discussed importance of medication adherence;        Counseled on importance of regular laboratory monitoring as prescribed;        Advised patient, providing education and rationale, to check cbg per doctor's order  and record        call provider for findings outside established parameters;       Review of patient status, including review of consultants reports, relevant laboratory and other test results, and medications completed;       Reviewed with patient the importance of taking medications as  prescribed, do not skip doses, do not double up, do not swap medication around  Reinforced with patient if she wants to lock up the medications as she says she does sometimes, to make sure and unlock at correct time and take medication as prescribed, pt verbalizes understanding Reinforced importance of using CPAP as prescribed Reinforced carbohydrate modified diet Reviewed with pt that her aide can watch her take medications to make sure she is taking correctly if there seem to be any issues or confusion for pt  Symptom Management: Take medications as prescribed   Attend all scheduled provider appointments Call pharmacy for medication refills 3-7 days in advance of running out of medications Attend church or other social activities Call provider office for new concerns or questions  check blood sugar at prescribed times: 4 times daily check feet daily for cuts, sores or redness enter blood sugar readings and medication or insulin into daily log take the blood sugar log to all doctor visits take the blood sugar meter to all doctor visits trim toenails straight across fill half of plate with vegetables manage portion size prepare main meal at home 3 to 5 days each week read food labels for fat, fiber, carbohydrates and portion size wash and dry feet carefully every day wear comfortable, cotton socks wear comfortable, well-fitting shoes Continue using CPAP machine as prescribed Take all medications as prescribed You acknowledged your aide is not allowed to prefill medication box but she can watch and make sure  you take the correct medication out of each bottle, if you are having any issues/ confusion about medications  Follow Up Plan: Telephone follow up appointment with care management team member scheduled for:  09/13/22 at 3 pm       CCM (HYPERTENSION)  EXPECTED OUTCOME:  MONITOR,SELF- MANAGE AND REDUCE SYMPTOMS OF HYPERTENSION     Current Barriers:  Knowledge Deficits related to  Hypertension Chronic Disease Management support and education needs related to management of Hypertension Patient reports her aide monitors blood pressure 7 days per week, states readings "vary from day to day but usually pretty good", pt states she is able to check her own blood pressure Patient reports she does smoke a "few cigarettes" per day but declines smoking cessation, states she continues to weigh daily for CHF Patient reports she continues following up with counselor and psychiatrist for mental health issues, feels this is helping and states " I can get my feelings out"  Planned Interventions: Evaluation of current treatment plan related to hypertension self management and patient's adherence to plan as established by provider;   Reviewed medications with patient and discussed importance of compliance;  Counseled on the importance of exercise goals with target of 150 minutes per week Discussed plans with patient for ongoing care management follow up and provided patient with direct contact information for care management team; Discussed complications of poorly controlled blood pressure such as heart disease, stroke, circulatory complications, vision complications, kidney impairment, sexual dysfunction;  Reinforced low sodium diet  Symptom Management: Take medications as prescribed   Attend all scheduled provider appointments Call pharmacy for medication refills 3-7 days in advance of running out of medications Attend church or other social activities Call provider office for new concerns or questions  check blood pressure 3 times per week choose a place to take my blood pressure (home, clinic or office, retail store) write blood pressure results in a log or diary learn about high blood pressure take blood pressure log to all doctor appointments call doctor for signs and symptoms of high blood pressure develop an action plan for high blood pressure keep all doctor  appointments take medications for blood pressure exactly as prescribed begin an exercise program report new symptoms to your doctor eat more whole grains, fruits and vegetables, lean meats and healthy fats Continue to follow low salt diet- read labels Continue following up with your counselor and psychiatrist Continue to weigh daily  Follow Up Plan: Telephone follow up appointment with care management team member scheduled for:  09/13/22 at 3 pm       Eat Healthy     Notes: pt would like to work on her overall health.      Exercise 3x per week (30 min per time)     Recommend starting chair exercises at least 3 days a week for 30-45 minutes at a time as tolerated.       Medication Management     Patient Goals/Self-Care Activities Patient will:  Focus on medication adherence by keeping up with prescription refills and either using a pill box or reminders to take your medications at the prescribed times Check blood sugar four times a day at the following times: fasting (at least 8 hours since last food consumption), 1-2 hours after all meals, and whenever patient experiences symptoms of hypo/hyperglycemia, document, and provide at future appointments Follow-up with all specialists as scheduled       Prevent falls     Prevent falls  Quit smoking / using tobacco     Doing the best she can at cutting back        This is a list of the screening recommended for you and due dates:  Health Maintenance  Topic Date Due   COVID-19 Vaccine (5 - 2023-24 season) 04/21/2022   Flu Shot  10/12/2022   Hemoglobin A1C  10/19/2022   Complete foot exam   12/07/2022   Yearly kidney health urinalysis for diabetes  03/10/2023   Medicare Annual Wellness Visit  03/10/2023   Eye exam for diabetics  05/27/2023   Yearly kidney function blood test for diabetes  06/29/2023   Mammogram  12/29/2023   DTaP/Tdap/Td vaccine (4 - Td or Tdap) 07/13/2032   Pneumonia Vaccine  Completed   DEXA scan (bone  density measurement)  Completed   Hepatitis C Screening: USPSTF Recommendation to screen - Ages 6-79 yo.  Completed   Zoster (Shingles) Vaccine  Completed   HPV Vaccine  Aged Out   Colon Cancer Screening  Discontinued

## 2022-07-20 ENCOUNTER — Telehealth: Payer: Self-pay | Admitting: Family Medicine

## 2022-07-20 NOTE — H&P (Signed)
Patient's anticipated LOS is less than 2 midnights, meeting these requirements: - Younger than 59 - Lives within 1 hour of care - Has a competent adult at home to recover with post-op recover - NO history of  - Chronic pain requiring opiods  - Diabetes  - Coronary Artery Disease  - Heart failure  - Heart attack  - Stroke  - DVT/VTE  - Cardiac arrhythmia  - Respiratory Failure/COPD  - Renal failure  - Anemia  - Advanced Liver disease     [name] is an 72 y.o. female.    Chief Complaint: left shoulder pain  HPI: Pt is a 72 y.o. female complaining of left shoulder pain for multiple years. Pain had continually increased since the beginning. X-rays in the clinic show end-stage arthritic changes of the left shoulder. Pt has tried various conservative treatments which have failed to alleviate their symptoms, including injections and therapy. Various options are discussed with the patient. Risks, benefits and expectations were discussed with the patient. Patient understand the risks, benefits and expectations and wishes to proceed with surgery.   PCP:  Kerri Perches, MD  D/C Plans: Home  PMH: Past Medical History:  Diagnosis Date   Allergy    Anemia    Anemia in chronic kidney disease (CKD) 08/10/2021   Anemia in chronic kidney disease (CKD) 10/12/2021   Anxiety    takes Ativan daily   Arthritis    Assistance needed for mobility    Bipolar disorder (HCC)    takes Risperdal nightly   Blood transfusion    Brain tumor (HCC)    Cancer (HCC)    In her gum   Carpal tunnel syndrome of right wrist 05/23/2011   Cervical disc disorder with radiculopathy of cervical region 10/31/2012   Chronic back pain    Chronic idiopathic constipation    Chronic neck and back pain    Colon polyps    COPD (chronic obstructive pulmonary disease) with chronic bronchitis 09/16/2013   Office Spirometry 10/30/2013-submaximal effort based on appearance of loop and curve. Numbers would fit  with severe restriction but her physiologic capability may be better than this. FVC 0.91/44%, and 10.74/45%, FEV1/FVC 0.81, FEF 25-75% 1.43/69%     Diabetes mellitus    Type II   Diverticulosis    TCS 9/08 by Dr. Lina Sar for diarrhea . Bx for micro scopic colitis negative.    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   Metabolic encephalopathy 08/03/2011   Migraines    chronic headaches   Mononeuritis lower limb    Narcolepsy    Osteoporosis    Pancreatitis 2006   due to Depakote with normal EUS    Paralysis (HCC)    Schatzki's ring    non critical / EGD with ED 8/2011with RMR   Seizures (HCC)    takes Lamictal daily.Last seizure 3 yrs ago   Sleep apnea    on  CPAP   Small bowel obstruction (HCC)    Stroke (HCC)    left sided weakness, speech changes   Tubular adenoma of colon     PSH: Past Surgical History:  Procedure Laterality Date   ABDOMINAL HYSTERECTOMY  1978   BACK SURGERY  July 2012   BACTERIAL OVERGROWTH TEST N/A 05/05/2013   Procedure: BACTERIAL OVERGROWTH TEST;  Surgeon: Corbin Ade, MD;  Location: [facility];  Service: Endoscopy;  Laterality: N/A;  7:30   BIOPSY THYROID  2009   BRAIN SURGERY  11/2011   resection of meningioma   BREAST REDUCTION SURGERY  1994   CARDIAC CATHETERIZATION  05/10/2005   normal coronaries, normal LV systolic function and EF (Dr. Evlyn Courier)   CARPAL TUNNEL RELEASE Left 07/22/04   Dr. Romeo Apple   CATARACT EXTRACTION Bilateral    CHOLECYSTECTOMY  1984   COLONOSCOPY  N/A 09/25/2012   [id number] diverticulosis.  colonic polyp-removed : tubular adenoma   CRANIOTOMY  11/23/2011   Procedure: CRANIOTOMY TUMOR EXCISION;  Surgeon: Hewitt Shorts, MD;  Location: [facility];  Service: Neurosurgery;  Laterality: N/A;  Craniotomy for tumor resection   ESOPHAGOGASTRODUODENOSCOPY  12/29/2010   Rourk-Retained food in the esophagus and stomach, small hiatal hernia, status post Maloney dilation of the esophagus   ESOPHAGOGASTRODUODENOSCOPY N/A 09/25/2012   [id number] atonic baggy esophagus status post Maloney dilation 56 F. Hiatal hernia   GIVENS CAPSULE STUDY N/A 01/15/2013   NORMAL.    IR GENERIC HISTORICAL  03/17/2016   IR RADIOLOGIST EVAL & MGMT 03/17/2016 [facility]   LESION REMOVAL N/A 05/31/2015   Procedure: REMOVAL RIGHT AND LEFT LESIONS OF MANDIBLE;  Surgeon: Ocie Doyne, DDS;  Location: [facility] OR;  Service: Oral Surgery;  Laterality: N/A;   MALONEY DILATION  12/29/2010   RMR;   NM MYOCAR PERF WALL MOTION  2006   "relavtiely normal" persantine, mild anterior thinning (breast attenuation artifact), no region of scar/ischemia   OVARIAN CYST REMOVAL     RECTOCELE REPAIR N/A 06/29/2015   Procedure: POSTERIOR REPAIR (RECTOCELE);  Surgeon: Tilda Burrow, MD;  Location: [facility];  Service: Gynecology;  Laterality: N/A;   REDUCTION MAMMAPLASTY Bilateral    SPINE SURGERY  09/29/2010   Dr. Shon Baton   surgical excision of 3 tumors from right thigh and right buttock  and left upper thigh  2010   TOOTH EXTRACTION Bilateral 12/14/2014   Procedure: REMOVAL OF BILATERAL MANDIBULAR EXOSTOSES;  Surgeon: Ocie Doyne, DDS;  Location: [facility] OR;  Service: Oral Surgery;  Laterality: Bilateral;   TRANSTHORACIC ECHOCARDIOGRAM  2010   EF 60-65%, mild conc LVH, grade 1 diastolic dysfunction; mildly calcified MV annulus with mildly thickened leaflets, mildly calcified MR annulus    Social History:  reports that she has been smoking cigarettes. She has a 23.50 pack-year smoking history. She  has been exposed to tobacco smoke. She has never used smokeless tobacco. She reports that she does not drink alcohol and does not use drugs. BMI: There is no height or weight on file to calculate BMI.  Lab Results  Component Value Date   ALBUMIN 4.2 06/29/2022   Diabetes:   Patient has a diagnosis of diabetes,  Lab Results  Component Value Date   HGBA1C 5.5 04/20/2022   Smoking Status: Social History   Tobacco Use  Smoking Status Some Days   Packs/day: 0.50   Years: 47.00   Additional pack years: 0.00   Total pack years: 23.50   Types: Cigarettes   Last attempt  to quit: 07/05/2021   Years since quitting: 1.0   Passive exposure: Past  Smokeless Tobacco Never  Tobacco Comments   3-4 a day    Ready to quit: Not Answered Counseling given: Not Answered Tobacco comments: 3-4 a day   The patient has participated in a 4-week cessation program.          Allergies:  Allergies  Allergen Reactions   Iron Nausea And Vomiting    And itching And itching   Milk (Cow) Rash    Doesn't agree with stomach.  Doesn't agree with stomach.  Doesn't agree with stomach.    Penicillins Hives    Has patient had a PCN reaction causing immediate rash, facial/tongue/throat swelling, SOB or lightheadedness with hypotension: Yes Has patient had a PCN reaction causing severe rash involving mucus membranes or skin necrosis: No Has patient had a PCN reaction that required hospitalization No Has patient had a PCN reaction occurring within the last 10 years: No If all of the above answers are "NO", then may proceed with Cephalosporin use.  Other reaction(s): Other (see comments) Has patient had a PCN reaction causing immediate rash, facial/tongue/throat swelling, SOB or lightheadedness with hypotension: Yes Has patient had a PCN reaction causing severe rash involving mucus membranes or skin necrosis: No Has patient had a PCN reaction that required hospitalization No Has patient had a PCN  reaction occurring within the last 10 years: No If all of the above answers are "NO", then may proceed with Cephalosporin use. Has patient had a PCN reaction causing immediate rash, facial/tongue/throat swelling, SOB or lightheadedness with hypotension: Yes Has patient had a PCN reaction causing severe rash involving mucus membranes or skin necrosis: No Has patient had a PCN reaction that required hospitalization No Has patient had a PCN reaction occurring within the last 10 years: No If all of the above answers are "NO", then may proceed with Cephalosporin use. Has patient had a PCN reaction causing immediate rash, facial/tongue/throat swelling, SOB or lightheadedness with hypotension: Yes Has patient had a PCN reaction causing severe rash involving mucus membranes or skin necrosis: No Has patient had a PCN reaction that required hospitalization No Has patient had a PCN reaction occurring within the last 10 years: No If all of the above answers are "NO", then may proceed with Cephalosporin use. Has patient had a PCN reaction causing immediate rash, facial/tongue/throat swelling, SOB or lightheadedness with hypotension: Yes Has patient had a PCN reaction causing sever... (TRUNCATED)   Phenazopyridine Hives   Phenazopyridine Hcl Hives   Cephalexin Hives   Flonase [Fluticasone]     "It gave me ulcers in my nose"   Milk-Related Compounds Other (See Comments)    Doesn't agree with stomach.    Phenazopyridine Hcl Hives          Medications: No current facility-administered medications for this encounter.   Current Outpatient Medications  Medication Sig Dispense Refill   ACCU-CHEK GUIDE test strip USE TO CHECK BLOOD SUGAR FOUR TIMES A DAY AND AS NEEDED (NEED MD APPOINTMENT FOR ANY FURTHER REFILLS) 450 strip 3   Accu-Chek Softclix Lancets lancets TEST BLOOD SUGAR THREE TIMES DAILY AS DIRECTED 300 each 2   acetaminophen (TYLENOL) 325 MG tablet Take 650 mg by mouth every 6 (six) hours as  needed.     albuterol (PROVENTIL) (2.5 MG/3ML) 0.083% nebulizer solution Take 3 mLs (2.5 mg total) by nebulization every 6 (six) hours as needed for wheezing or shortness of breath. 75 mL 12  albuterol (VENTOLIN HFA) 108 (90 Base) MCG/ACT inhaler INHALE 1 TO 2 PUFFS EVERY 6 HOURS AS NEEDED FOR WHEEZING, SHORTNESS OF BREATH (Patient taking differently: Inhale 1-2 puffs into the lungs every 6 (six) hours as needed for wheezing or shortness of breath.) 3 each 3   Alcohol Swabs (DROPSAFE ALCOHOL PREP) 70 % PADS USE TO CLEAN FINGER PRIOR TO TESTING FOR BLOOD SUGAR AS DIRECTED 300 each 3   alendronate (FOSAMAX) 70 MG tablet TAKE 1 TABLET EVERY 7 DAYS ON AN EMPTY STOMACH WITH A FULL GLASS OF WATER (Patient taking differently: Take 70 mg by mouth once a week.) 12 tablet 3   amLODipine (NORVASC) 10 MG tablet TAKE 1 TABLET EVERY DAY 60 tablet 0   ascorbic acid (VITAMIN C) 500 MG tablet Take 500 mg by mouth daily.     aspirin EC 81 MG tablet Take 1 tablet (81 mg total) by mouth daily with breakfast. 120 tablet 2   benzonatate (TESSALON) 200 MG capsule Take 1 capsule (200 mg total) by mouth 3 (three) times daily as needed for cough. 40 capsule 0   Blood Glucose Calibration (ACCU-CHEK GUIDE CONTROL) LIQD USE AS DIRECTED 1 each 0   blood glucose meter kit and supplies Dispense based on patient and insurance preference. Use up to four times daily as directed. (FOR ICD-10 E10.9, E11.9). 1 each 0   buPROPion (WELLBUTRIN XL) 150 MG 24 hr tablet Take 1 tablet (150 mg total) by mouth every morning. 90 tablet 2   Calcium Carb-Cholecalciferol (CALTRATE 600+D3 SOFT) 600-20 MG-MCG CHEW Chew 1 tablet by mouth daily at 12 noon.     cetirizine (ZYRTEC) 10 MG tablet Take 1 tablet (10 mg total) by mouth daily. 30 tablet 1   Cholecalciferol (D3 PO) Take 1 tablet by mouth daily.     clobetasol (TEMOVATE) 0.05 % external solution Apply 1 Application topically 2 (two) times daily. 50 mL 1   Continuous Blood Gluc Sensor (FREESTYLE  LIBRE 14 DAY SENSOR) MISC 1 each by Does not apply route every 14 (fourteen) days. Change every 2 weeks (Patient not taking: Reported on 07/07/2022) 2 each 11   diazepam (VALIUM) 5 MG tablet Take 1 tablet (5 mg total) by mouth once as needed (MRI claustrophobia). 6 tablet 0   Docusate Sodium (DSS) 100 MG CAPS Take 1 tablet by mouth in the morning and at bedtime.     DROPLET PEN NEEDLES 31G X 8 MM MISC USE FOR INJECTING INSULIN 4 TIMES DAILY. 400 each 0   Elastic Bandages & Supports (ADJUSTABLE ARM SLING) MISC L arm sling 1 each 0   ezetimibe (ZETIA) 10 MG tablet TAKE 1 TABLET EVERY DAY 90 tablet 10   hydrOXYzine (ATARAX) 10 MG tablet Take one tablet by mouth once daily, as needed, for excessive itching 14 tablet 0   insulin aspart (NOVOLOG FLEXPEN) 100 UNIT/ML FlexPen Inject 5 Units into the skin 3 (three) times daily with meals. Give if eats 50% or more of meal. (Patient taking differently: Inject 14 Units into the skin 3 (three) times daily with meals. Give if eats 50% or more of meal.) 60 mL 0   ipratropium (ATROVENT) 0.03 % nasal spray Place 2 sprays into both nostrils every 12 (twelve) hours. 30 mL 12   lactobacillus acidophilus (BACID) TABS tablet Take 2 tablets by mouth 3 (three) times daily.     lamoTRIgine (LAMICTAL) 100 MG tablet Take 1 tablet (100 mg total) by mouth 2 (two) times daily. 180 tablet 2  levothyroxine (SYNTHROID) 50 MCG tablet TAKE 1 TABLET DAILY SIX DAYS A WEEK AND 1/2 TABLET ON ONE DAY A WEEK 85 tablet 2   LORazepam (ATIVAN) 0.5 MG tablet Take 1 tablet (0.5 mg total) by mouth 2 (two) times daily. 60 tablet 2   methocarbamol (ROBAXIN-750) 750 MG tablet Take 1 tablet (750 mg total) by mouth every 8 (eight) hours as needed for muscle spasms. 21 tablet 0   metoprolol tartrate (LOPRESSOR) 50 MG tablet TAKE 1 TABLET TWICE DAILY (NEED MD APPOINTMENT) (Patient taking differently: Take 50 mg by mouth 2 (two) times daily.) 180 tablet 3   Misc. Devices (MATTRESS PAD) MISC FIRM MATTRESS  PAD for hospital bed x 1 1 each 0   montelukast (SINGULAIR) 10 MG tablet TAKE 1 TABLET EVERY DAY 90 tablet 3   Multiple Vitamins-Minerals (MULTIVITAMIN GUMMIES ADULT PO) Take 1 tablet by mouth daily.     MYRBETRIQ 25 MG TB24 tablet TAKE 1 TABLET EVERY DAY 90 tablet 3   nicotine (NICODERM CQ) 21 mg/24hr patch Place 1 patch (21 mg total) onto the skin daily. (Patient not taking: Reported on 07/07/2022) 28 patch 0   Omega-3 Fatty Acids (FISH OIL PO) Take 1 capsule by mouth daily.     oxyCODONE-acetaminophen (PERCOCET) 10-325 MG tablet Take 1 tablet by mouth every 8 (eight) hours as needed for pain.     potassium chloride (KLOR-CON) 10 MEQ tablet Take 1 tablet (10 mEq total) by mouth 2 (two) times daily. 30 tablet 2   pregabalin (LYRICA) 75 MG capsule Take 1 capsule (75 mg total) by mouth daily.     RABEprazole (ACIPHEX) 20 MG tablet TAKE 1 TABLET TWICE DAILY 180 tablet 2   RESTASIS 0.05 % ophthalmic emulsion Place 2 drops into both eyes daily.     risperiDONE (RISPERDAL) 0.5 MG tablet Take 1 tablet (0.5 mg total) by mouth at bedtime. 90 tablet 2   rosuvastatin (CRESTOR) 5 MG tablet TAKE 1 TABLET AT BEDTIME 90 tablet 3   sertraline (ZOLOFT) 100 MG tablet Take 1 tablet (100 mg total) by mouth every morning. 90 tablet 2   SPIKEVAX injection      spironolactone (ALDACTONE) 50 MG tablet TAKE 1 TABLET (50 MG TOTAL) BY MOUTH DAILY. DISCONTINUE SPIRONOLACTONE 25MG  90 tablet 3   telmisartan (MICARDIS) 20 MG tablet Take 10 mg by mouth daily.     TOUJEO MAX SOLOSTAR 300 UNIT/ML Solostar Pen INJECT 20-26 UNITS INTO THE SKIN DAILY. 12 mL 3   traZODone (DESYREL) 150 MG tablet Take 1 tablet (150 mg total) by mouth at bedtime. 90 tablet 2   umeclidinium-vilanterol (ANORO ELLIPTA) 62.5-25 MCG/ACT AEPB Inhale 1 puff into the lungs daily. 60 each 5   vitamin E 180 MG (400 UNITS) capsule Take 400 Units by mouth daily.      No results found. However, due to the size of the patient record, not all encounters were  searched. Please check Results Review for a complete set of results. No results found.  ROS: Pain with rom of the left upper extremity  Physical Exam: Alert and oriented 72 y.o. female in no acute distress Cranial nerves 2-12 intact Cervical spine: full rom with no tenderness, nv intact distally Chest: active breath sounds bilaterally, no wheeze rhonchi or rales Heart: regular rate and rhythm, no murmur Abd: non tender non distended with active bowel sounds Hip is stable with rom  Left shoulder painful and weak rom Nv intact distally No rashes or edema distally  Assessment/Plan Assessment: left  shoulder cuff arthropathy  Plan:  Patient will undergo a left reverse total shoulder by Dr. Ranell Patrick at [place] Risks benefits and expectations were discussed with the patient. Patient understand risks, benefits and expectations and wishes to proceed. Preoperative templating of the joint replacement has been completed, documented, and submitted to the Operating Room personnel in order to optimize intra-operative equipment management.   Alphonsa Overall [name], MPAS [facility] is now [employer] [address], [place], [place] [address] Phone: [phone] [email or web address] Facebook  [employer]

## 2022-07-20 NOTE — Telephone Encounter (Signed)
Genia Del from [facility] health called looking for the home health certification and plan of care form for date 04.05.2024 to 06.03.2024 and order # [id number].  Burna Mortimer call back # [phone].

## 2022-07-20 NOTE — Telephone Encounter (Signed)
Forms received will fax once signed  

## 2022-07-21 ENCOUNTER — Ambulatory Visit (INDEPENDENT_AMBULATORY_CARE_PROVIDER_SITE_OTHER): Payer: Medicare HMO | Admitting: Psychiatry

## 2022-07-21 ENCOUNTER — Other Ambulatory Visit: Payer: Self-pay

## 2022-07-21 DIAGNOSIS — F331 Major depressive disorder, recurrent, moderate: Secondary | ICD-10-CM | POA: Diagnosis not present

## 2022-07-21 DIAGNOSIS — Z794 Long term (current) use of insulin: Secondary | ICD-10-CM

## 2022-07-21 MED ORDER — NOVOLOG FLEXPEN 100 UNIT/ML ~~LOC~~ SOPN
5.0000 [IU] | PEN_INJECTOR | Freq: Three times a day (TID) | SUBCUTANEOUS | 2 refills | Status: DC
Start: 2022-07-21 — End: 2022-10-03

## 2022-07-21 NOTE — Progress Notes (Signed)
Virtual Visit via Telephone Note  I connected with [name] on 07/21/22 at 10:10 AM EDT  by telephone and verified that I am speaking with the correct person using two identifiers.  Location: Patient: Home Provider: [facility] [place] office    I discussed the limitations, risks, security and privacy concerns of performing an evaluation and management service by telephone and the availability of in person appointments. I also discussed with the patient that there may be a patient responsible charge related to this service. The patient expressed understanding and agreed to proceed.    I provided 47 minutes of non-face-to-face time during this encounter.   [name] Salvage, LCSW              THERAPIST PROGRESS NOTE    Session Time: Friday  07/20/2022 10:10 AM  - 10:57 AM   Participation Level: Active  Behavioral Response: less depressed, talkative,   Type of Therapy: Individual Therapy  Treatment Goals addressed: Patient [name] score less than 10 on the patient health questionnaireAnnie "[place]" [name] IDENTIFY 3 COGNITIVE PATTERNS AND BELIEFS THAT SUPPORT DEPRESSION   Progress on goals: Progressing  Interventions: CBT and Supportive             Summary: [name] is a 72 y.o. female who presents with  long standing history of recurrent periods  of depression beginning when she was [age] and her favorite uncle died. Patient reports multiple psychiatric hospitlaizations due to depression and suicidal ideations with the last one occuring in 1997. Patient has participated in outpatient psychotherapy and medication management intermittently since age 30.  She currently is seeing psychiatrist [name] . Prior to this, she was seen at [facility]. Patient also has had ECT at [facility]. Symptoms have worsened in recent months due to family stress and have  included depressed mood, anxiety, excessive worry, and tearfulness.            Patient's last contact was by virtual visit via  telephone about 6-7 weeks ago.  Patient reports feeling depressed at times but overall experiencing improved mood.  She reports recently enjoying celebrating her birthday.  She received visits from her son and grandchildren. Sh e also is pleased they took her out to dinner.  She also reports receiving for visits as well as presents from friends.  She is continuing to experience shoulder pain but is hopeful upcoming surgery [name] alleviate the pain.  Patient reports she has been more involved in activities.  She has been attending regular church services as well as extracurricular activities at church like helping decorate the church nursery and attending other women's fellowship events.  She also continues to work well with her current aide and has been doing errands like going to the grocery store.  Patient still expresses frustration at times regarding the way she perceives herself as being treated by some of her family members as well as church members.     Suicidal/Homicidal: Nowithout intent/plan      Therapist Response: Reviewed symptoms, praised and reinforced patient's increased involvement in activity, discussed effects, oriented patient to CBT using the CBT model, assisted patient identify the connection between thoughts/feelings/behavior using model as well as examples in patient's life, assisted patient process recent incident at church, began to assist patient identify and examine her thought patterns about the incident as well as the effects on her feelings and behavior, [name] continue to examine thought patterns as well as discussed common thinking errors next session, encouraged patient  to maintain involvement in activity   Diagnosis: Axis I: MDD, Recurrent, moderate    Axis II: Deferred Collaboration of Care: Psychiatrist AEB patient working with psychiatrist [name]  Patient/Guardian was advised Release of Information must be obtained prior to any record release in order to collaborate  their care with an outside provider. Patient/Guardian was advised if they have not already done so to contact the registration department to sign all necessary forms in order for [name] to release information regarding their care.   Consent: Patient/Guardian gives verbal consent for treatment and assignment of benefits for services provided during this visit. Patient/Guardian expressed understanding and agreed to proceed.    [name] Deamer E Ghislaine Harcum, LCSW

## 2022-07-21 NOTE — Telephone Encounter (Signed)
Pt called and lvm with questions about her insulin. Pt contacted and could not recall what the issues was with the pharmacy.  Pharmacy contacted and they advised no rx for Novolog was on file for pt. Rx sent.

## 2022-07-23 ENCOUNTER — Encounter: Payer: Self-pay | Admitting: Internal Medicine

## 2022-07-23 NOTE — Assessment & Plan Note (Signed)
Going smoking-emphasized. Refill Anoro

## 2022-07-23 NOTE — Assessment & Plan Note (Signed)
Sized importance again of smoking cessation and offered support.  She is not yet motivated.

## 2022-07-23 NOTE — Assessment & Plan Note (Signed)
Been reviewed compliance goals and comfort issues.  Trying to motivate her to use CPAP more consistently.  Benefits when used.

## 2022-07-24 ENCOUNTER — Ambulatory Visit: Payer: Medicare HMO

## 2022-07-24 DIAGNOSIS — E1142 Type 2 diabetes mellitus with diabetic polyneuropathy: Secondary | ICD-10-CM

## 2022-07-24 NOTE — Progress Notes (Signed)
Patient presents to the office today for diabetic shoe and insole measuring.  ABN signed.   Documentation of medical necessity will be sent to patient's treating diabetic doctor to verify and sign.   Patient's diabetic provider:   Carlus Pavlov, MD    Shoes and insoles will be ordered at that time and patient will be notified for an appointment for fitting when they arrive.   Patient shoe selection-   1st   Shoe choice:   [id number]  Shoe size ordered: 8.5

## 2022-07-26 ENCOUNTER — Telehealth: Payer: Self-pay | Admitting: Family Medicine

## 2022-07-26 NOTE — Telephone Encounter (Signed)
Pt would like a call back in regards to a special bed being ordered for her back. Its urgent needs a call back by the end of day please.

## 2022-07-27 ENCOUNTER — Other Ambulatory Visit: Payer: Self-pay

## 2022-07-27 ENCOUNTER — Encounter (HOSPITAL_COMMUNITY): Payer: Self-pay | Admitting: *Deleted

## 2022-07-27 ENCOUNTER — Emergency Department (HOSPITAL_COMMUNITY)
Admission: EM | Admit: 2022-07-27 | Discharge: 2022-07-27 | Disposition: A | Discharge status: Home / Self Care | Payer: Medicare HMO | Attending: Emergency Medicine | Admitting: Emergency Medicine

## 2022-07-27 ENCOUNTER — Emergency Department (HOSPITAL_COMMUNITY): Payer: Medicare HMO

## 2022-07-27 DIAGNOSIS — J449 Chronic obstructive pulmonary disease, unspecified: Secondary | ICD-10-CM | POA: Insufficient documentation

## 2022-07-27 DIAGNOSIS — R0602 Shortness of breath: Secondary | ICD-10-CM | POA: Diagnosis not present

## 2022-07-27 DIAGNOSIS — I509 Heart failure, unspecified: Secondary | ICD-10-CM | POA: Insufficient documentation

## 2022-07-27 DIAGNOSIS — R918 Other nonspecific abnormal finding of lung field: Secondary | ICD-10-CM | POA: Diagnosis not present

## 2022-07-27 DIAGNOSIS — Z7982 Long term (current) use of aspirin: Secondary | ICD-10-CM | POA: Insufficient documentation

## 2022-07-27 DIAGNOSIS — R0789 Other chest pain: Secondary | ICD-10-CM

## 2022-07-27 DIAGNOSIS — Z794 Long term (current) use of insulin: Secondary | ICD-10-CM | POA: Insufficient documentation

## 2022-07-27 DIAGNOSIS — N189 Chronic kidney disease, unspecified: Secondary | ICD-10-CM | POA: Insufficient documentation

## 2022-07-27 DIAGNOSIS — E119 Type 2 diabetes mellitus without complications: Secondary | ICD-10-CM | POA: Insufficient documentation

## 2022-07-27 DIAGNOSIS — R0989 Other specified symptoms and signs involving the circulatory and respiratory systems: Secondary | ICD-10-CM | POA: Diagnosis not present

## 2022-07-27 DIAGNOSIS — Z7951 Long term (current) use of inhaled steroids: Secondary | ICD-10-CM | POA: Diagnosis not present

## 2022-07-27 DIAGNOSIS — Z7984 Long term (current) use of oral hypoglycemic drugs: Secondary | ICD-10-CM | POA: Insufficient documentation

## 2022-07-27 MED ORDER — HYDROCODONE-ACETAMINOPHEN 5-325 MG PO TABS
1.0000 | ORAL_TABLET | Freq: Once | ORAL | Status: AC
  Administered 2022-07-27: 1 via ORAL
  Filled 2022-07-27: qty 1

## 2022-07-27 NOTE — Discharge Instructions (Signed)
Evaluation for your chest wall pain today was reassuring.  X-ray was negative.  You not have a rib fracture and lungs appear normal.  Please follow-up with your PCP.  Please return to the ED for further evaluation if your chest pain or shortness of breath getting worse.

## 2022-07-27 NOTE — ED Triage Notes (Signed)
Pt states she fell against a door this morning, hitting her right rib cage; pt states it hurts to take a deep breath

## 2022-07-27 NOTE — Telephone Encounter (Signed)
Pt called back in regard to previous tele message. Has not heard back in regard. Wants a call back today.

## 2022-07-27 NOTE — ED Provider Notes (Signed)
[place] EMERGENCY DEPARTMENT AT [facility] Provider Note   CSN: [id number] Arrival date & time: 07/27/22  4098     History  Chief Complaint  Patient presents with   Chest Pain   HPI [name] is a 72 y.o. female with CKD, diabetes, CHF, and COPD presenting for chest wall pain.  Started this morning.  Patient was opening her front door when the side of the door hit the right lateral chest under her breast.  It was immediately painful.  Feels sharp and nonradiating.  Now endorsing associated shortness of breath.  Denies any bruising or abrasion to the area of her chest.     Chest Pain      Home Medications Prior to Admission medications   Medication Sig Start Date End Date Taking? Authorizing Provider  ACCU-CHEK GUIDE test strip USE TO CHECK BLOOD SUGAR FOUR TIMES A DAY AND AS NEEDED (NEED MD APPOINTMENT FOR ANY FURTHER REFILLS) 05/10/22   Carlus Pavlov, MD  Accu-Chek Softclix Lancets lancets TEST BLOOD SUGAR THREE TIMES DAILY AS DIRECTED 03/28/21   Carlus Pavlov, MD  acetaminophen (TYLENOL) 325 MG tablet Take 650 mg by mouth every 6 (six) hours as needed.    [provider]  albuterol (PROVENTIL) (2.5 MG/3ML) 0.083% nebulizer solution Take 3 mLs (2.5 mg total) by nebulization every 6 (six) hours as needed for wheezing or shortness of breath. 11/25/21   Icard, Rachel Bo, DO  albuterol (VENTOLIN HFA) 108 (90 Base) MCG/ACT inhaler INHALE 1 TO 2 PUFFS EVERY 6 HOURS AS NEEDED FOR WHEEZING, SHORTNESS OF BREATH Patient taking differently: Inhale 1-2 puffs into the lungs every 6 (six) hours as needed for wheezing or shortness of breath. 06/24/21   Waymon Budge, MD  Alcohol Swabs (DROPSAFE ALCOHOL PREP) 70 % PADS USE TO CLEAN FINGER PRIOR TO TESTING FOR BLOOD SUGAR AS DIRECTED 05/10/22   Carlus Pavlov, MD  alendronate (FOSAMAX) 70 MG tablet TAKE 1 TABLET EVERY 7 DAYS ON AN EMPTY STOMACH WITH A FULL GLASS OF WATER Patient taking differently: Take 70 mg by  mouth once a week. 11/07/21   Kerri Perches, MD  amLODipine (NORVASC) 10 MG tablet TAKE 1 TABLET EVERY DAY 04/11/22   Dyann Kief, PA-C  ascorbic acid (VITAMIN C) 500 MG tablet Take 500 mg by mouth daily.    [provider]  aspirin EC 81 MG tablet Take 1 tablet (81 mg total) by mouth daily with breakfast. 12/08/18   Emokpae, Courage, MD  benzonatate (TESSALON) 200 MG capsule Take 1 capsule (200 mg total) by mouth 3 (three) times daily as needed for cough. 03/17/22   Jetty Duhamel D, MD  Blood Glucose Calibration (ACCU-CHEK GUIDE CONTROL) LIQD USE AS DIRECTED 12/13/20   Carlus Pavlov, MD  blood glucose meter kit and supplies Dispense based on patient and insurance preference. Use up to four times daily as directed. (FOR ICD-10 E10.9, E11.9). 06/27/19   Carlus Pavlov, MD  buPROPion (WELLBUTRIN XL) 150 MG 24 hr tablet Take 1 tablet (150 mg total) by mouth every morning. 07/06/22 07/06/23  Myrlene Broker, MD  Calcium Carb-Cholecalciferol (CALTRATE 600+D3 SOFT) 600-20 MG-MCG CHEW Chew 1 tablet by mouth daily at 12 noon.    [provider]  cetirizine (ZYRTEC) 10 MG tablet Take 1 tablet (10 mg total) by mouth daily. 04/25/21   Donell Beers, FNP  Cholecalciferol (D3 PO) Take 1 tablet by mouth daily.    [provider]  clobetasol (TEMOVATE) 0.05 %  external solution Apply 1 Application topically 2 (two) times daily. 06/28/22   Kerri Perches, MD  Continuous Blood Gluc Sensor (FREESTYLE LIBRE 14 DAY SENSOR) MISC 1 each by Does not apply route every 14 (fourteen) days. Change every 2 weeks Patient not taking: Reported on 07/07/2022 06/24/19   Carlus Pavlov, MD  diazepam (VALIUM) 5 MG tablet Take 1 tablet (5 mg total) by mouth once as needed (MRI claustrophobia). 04/27/22   Henreitta Leber, MD  Docusate Sodium (DSS) 100 MG CAPS Take 1 tablet by mouth in the morning and at bedtime.    [provider]  DROPLET PEN NEEDLES 31G X 8 MM MISC USE FOR  INJECTING INSULIN 4 TIMES DAILY. 09/02/21   Carlus Pavlov, MD  Elastic Bandages & Supports (ADJUSTABLE ARM SLING) MISC L arm sling 05/04/22   Kerri Perches, MD  ezetimibe (ZETIA) 10 MG tablet TAKE 1 TABLET EVERY DAY 01/19/22   Kerri Perches, MD  hydrOXYzine (ATARAX) 10 MG tablet Take one tablet by mouth once daily, as needed, for excessive itching 06/15/22   Kerri Perches, MD  insulin aspart (NOVOLOG FLEXPEN) 100 UNIT/ML FlexPen Inject 5 Units into the skin 3 (three) times daily with meals. Give if eats 50% or more of meal. 07/21/22   Carlus Pavlov, MD  ipratropium (ATROVENT) 0.03 % nasal spray Place 2 sprays into both nostrils every 12 (twelve) hours. 01/16/22   Glenford Bayley, NP  lactobacillus acidophilus (BACID) TABS tablet Take 2 tablets by mouth 3 (three) times daily.    [provider]  lamoTRIgine (LAMICTAL) 100 MG tablet Take 1 tablet (100 mg total) by mouth 2 (two) times daily. 07/06/22   Myrlene Broker, MD  levothyroxine (SYNTHROID) 50 MCG tablet TAKE 1 TABLET DAILY SIX DAYS A WEEK AND 1/2 TABLET ON ONE DAY A WEEK 01/30/22   Carlus Pavlov, MD  LORazepam (ATIVAN) 0.5 MG tablet Take 1 tablet (0.5 mg total) by mouth 2 (two) times daily. 07/06/22   Myrlene Broker, MD  methocarbamol (ROBAXIN-750) 750 MG tablet Take 1 tablet (750 mg total) by mouth every 8 (eight) hours as needed for muscle spasms. 05/22/22   Gardenia Phlegm, MD  metoprolol tartrate (LOPRESSOR) 50 MG tablet TAKE 1 TABLET TWICE DAILY (NEED MD APPOINTMENT) Patient taking differently: Take 50 mg by mouth 2 (two) times daily. 08/29/21   Strader, Lennart Pall, PA-C  Misc. Devices (MATTRESS PAD) MISC FIRM MATTRESS PAD for hospital bed x 1 02/22/21   Kerri Perches, MD  montelukast (SINGULAIR) 10 MG tablet TAKE 1 TABLET EVERY DAY 05/10/22   Kerri Perches, MD  Multiple Vitamins-Minerals (MULTIVITAMIN GUMMIES ADULT PO) Take 1 tablet by mouth daily.    [provider]  MYRBETRIQ 25 MG TB24  tablet TAKE 1 TABLET EVERY DAY 05/10/22   Kerri Perches, MD  nicotine (NICODERM CQ) 21 mg/24hr patch Place 1 patch (21 mg total) onto the skin daily. Patient not taking: Reported on 07/07/2022 06/20/22   Waymon Budge, MD  Omega-3 Fatty Acids (FISH OIL PO) Take 1 capsule by mouth daily.    [provider]  oxyCODONE-acetaminophen (PERCOCET) 10-325 MG tablet Take 1 tablet by mouth every 8 (eight) hours as needed for pain. 11/01/20   [provider]  potassium chloride (KLOR-CON) 10 MEQ tablet Take 1 tablet (10 mEq total) by mouth 2 (two) times daily. 02/01/22   Kerri Perches, MD  pregabalin (LYRICA) 75 MG capsule Take 1 capsule (75 mg total)  by mouth daily. 10/20/16   Elliot Cousin, MD  RABEprazole (ACIPHEX) 20 MG tablet TAKE 1 TABLET TWICE DAILY 11/09/21   Pyrtle, Carie Caddy, MD  RESTASIS 0.05 % ophthalmic emulsion Place 2 drops into both eyes daily. 07/29/20   [provider]  risperiDONE (RISPERDAL) 0.5 MG tablet Take 1 tablet (0.5 mg total) by mouth at bedtime. 07/06/22   Myrlene Broker, MD  rosuvastatin (CRESTOR) 5 MG tablet TAKE 1 TABLET AT BEDTIME 06/12/22   Kerri Perches, MD  sertraline (ZOLOFT) 100 MG tablet Take 1 tablet (100 mg total) by mouth every morning. 07/06/22   Myrlene Broker, MD  [facility] injection  02/24/22   [provider]  spironolactone (ALDACTONE) 50 MG tablet TAKE 1 TABLET (50 MG TOTAL) BY MOUTH DAILY. DISCONTINUE SPIRONOLACTONE 25MG  04/18/22   Kerri Perches, MD  telmisartan (MICARDIS) 20 MG tablet Take 10 mg by mouth daily. 11/23/21   [provider]  TOUJEO MAX SOLOSTAR 300 UNIT/ML Solostar Pen INJECT 20-26 UNITS INTO THE SKIN DAILY. 04/24/22   Carlus Pavlov, MD  traZODone (DESYREL) 150 MG tablet Take 1 tablet (150 mg total) by mouth at bedtime. 07/06/22   Myrlene Broker, MD  umeclidinium-vilanterol [facility] ELLIPTA) 62.5-25 MCG/ACT AEPB Inhale 1 puff into the lungs daily. 06/20/22   Waymon Budge, MD  vitamin E  180 MG (400 UNITS) capsule Take 400 Units by mouth daily.    [provider]      Allergies    Iron, Milk (cow), Penicillins, Phenazopyridine, Phenazopyridine hcl, Cephalexin, Flonase [fluticasone], Milk-related compounds, and Phenazopyridine hcl    Review of Systems   Review of Systems  Cardiovascular:  Positive for chest pain.    Physical Exam Updated Vital Signs BP (!) 196/82 (BP Location: Left Arm)   Pulse 81   Temp 98.2 F (36.8 C) (Oral)   Resp 16   Ht 4\' 11"  (1.499 m)   Wt 76.7 kg   SpO2 97%   BMI 34.15 kg/m  Physical Exam Vitals and nursing note reviewed.  HENT:     Head: Normocephalic and atraumatic.     Mouth/Throat:     Mouth: Mucous membranes are moist.  Eyes:     General:        Right eye: No discharge.        Left eye: No discharge.     Conjunctiva/sclera: Conjunctivae normal.  Cardiovascular:     Rate and Rhythm: Normal rate and regular rhythm.     Pulses: Normal pulses.     Heart sounds: Normal heart sounds.  Pulmonary:     Effort: Pulmonary effort is normal.     Breath sounds: Normal breath sounds.  Chest:     Chest wall: Tenderness (about the right lateral chest wall) present. No lacerations, deformity, swelling, crepitus or edema.  Abdominal:     General: Abdomen is flat.     Palpations: Abdomen is soft.  Skin:    General: Skin is warm and dry.  Neurological:     General: No focal deficit present.  Psychiatric:        Mood and Affect: Mood normal.     ED Results / Procedures / Treatments   Labs (all labs ordered are listed, but only abnormal results are displayed) Labs Reviewed - No data to display  EKG None  Radiology DG Chest 2 View  Result Date: 07/27/2022 CLINICAL DATA:  Trauma EXAM: CHEST - 2 VIEW COMPARISON:  CXR 04/27/22 FINDINGS: Enlarged cardiac contours, possibly  at least partially due to low lung volumes. No pleural effusion. No pneumothorax. There are prominent bilateral interstitial opacities could represent  pulmonary venous congestion or mild pulmonary edema. Low lung volumes. No radiographically apparent displaced rib fractures. Visualized upper abdomen is unremarkable. Vertebral body heights are maintained. Surgical clips in the right upper quadrant. IMPRESSION: 1. Enlarged cardiac contours, possibly at least partially due to low lung volumes. 2. Prominent bilateral interstitial opacities could represent pulmonary venous congestion or bronchovascular crowding in the setting of low lung volumes. Electronically Signed   By: Lorenza Cambridge M.D.   On: 07/27/2022 10:24    Procedures Procedures    Medications Ordered in ED Medications  HYDROcodone-acetaminophen (NORCO/VICODIN) 5-325 MG per tablet 1 tablet (1 tablet Oral Given 07/27/22 1019)    ED Course/ Medical Decision Making/ A&P                             Medical Decision Making  72 year old female who is well-appearing presenting for chest wall pain status post hitting a door today.  Exam revealed some right-sided chest wall tenderness but otherwise reassuring.  X-ray was negative.  Treat her pain with Norco.  Considered pneumothorax and rib fracture but unlikely given negative x-ray.  Patient stated that pain was improved after treatment.  Advised to follow-up with her PCP.  Discussed pertinent return precautions.  Vital stable discharge.        Final Clinical Impression(s) / ED Diagnoses Final diagnoses:  Chest wall pain    Rx / DC Orders ED Discharge Orders     None         Gareth Eagle, PA-C 07/27/22 1030    Bethann Berkshire, MD 07/29/22 1934

## 2022-07-27 NOTE — Telephone Encounter (Signed)
Patient requesting we order her a bed thru [employer] ok to send ?

## 2022-07-28 ENCOUNTER — Ambulatory Visit (INDEPENDENT_AMBULATORY_CARE_PROVIDER_SITE_OTHER): Payer: Medicare HMO | Admitting: *Deleted

## 2022-07-28 DIAGNOSIS — E1143 Type 2 diabetes mellitus with diabetic autonomic (poly)neuropathy: Secondary | ICD-10-CM

## 2022-07-28 DIAGNOSIS — I10 Essential (primary) hypertension: Secondary | ICD-10-CM

## 2022-07-28 NOTE — Telephone Encounter (Signed)
Pt called back in regard to previous tele messages   Orders should be for G5 matress  Need office notes w. Patient need for mattress   To [facility]  Phone [phone] Fax [phone]  Patient has spoken to [facility] and they have approved mattress

## 2022-07-28 NOTE — Transitions of Care (Post Inpatient/ED Visit) (Signed)
07/28/2022  Name: [name] MRN: [id number] DOB: [birth year]  Today's TOC FU Call Status: Today's TOC FU Call Status:: Successful TOC FU Call Competed TOC FU Call Complete Date: 07/28/22  Transition Care Management Follow-up Telephone Call Date of Discharge: 07/27/22 [name] Type of Discharge: Emergency Department Reason for ED Visit: Other: (Chest pain after door hit pt in lateral chest) How have you been since you were released from the hospital?: Better Any questions or concerns?: No  Items Reviewed: Did you receive and understand the discharge instructions provided?: Yes Medications obtained,verified, and reconciled?: Yes (Medications Reviewed) Any new allergies since your discharge?: No Dietary orders reviewed?: Yes Type of Diet Ordered:: low sodium, carbohydrate modified Do you have support at home?: Yes People in Home: alone Name of Support/Comfort Primary Source: pt has CAP aide  Medications Reviewed Today: Medications Reviewed Today     Reviewed by Wynelle Beckmann, RN (Registered Nurse) on 07/27/22 at [phone]  Med List Status: <None>   Medication Order Taking? Sig Documenting Provider Last Dose Status Informant  ACCU-CHEK GUIDE test strip [id number]  USE TO CHECK BLOOD SUGAR FOUR TIMES A DAY AND AS NEEDED (NEED MD APPOINTMENT FOR ANY FURTHER REFILLS) Carlus Pavlov, MD  Active   Accu-Chek Softclix Lancets lancets [id number]  TEST BLOOD SUGAR THREE TIMES DAILY AS DIRECTED Carlus Pavlov, MD  Active Self, Pharmacy Records  acetaminophen (TYLENOL) 325 MG tablet [id number]  Take 650 mg by mouth every 6 (six) hours as needed. [provider]  Active Self, Pharmacy Records  albuterol (PROVENTIL) (2.5 MG/3ML) 0.083% nebulizer solution [id number]  Take 3 mLs (2.5 mg total) by nebulization every 6 (six) hours as needed for wheezing or shortness of breath. Josephine Igo, DO  Active   albuterol (VENTOLIN HFA) 108 (90 Base) MCG/ACT inhaler  [id number]  INHALE 1 TO 2 PUFFS EVERY 6 HOURS AS NEEDED FOR WHEEZING, SHORTNESS OF BREATH  Patient taking differently: Inhale 1-2 puffs into the lungs every 6 (six) hours as needed for wheezing or shortness of breath.   Waymon Budge, MD  Active Self, Pharmacy Records           Med Note Daphine Deutscher, Sondra Come   Fri Sep 23, 2021  8:18 AM)    Alcohol Swabs (DROPSAFE ALCOHOL PREP) 70 % PADS [id number]  USE TO CLEAN FINGER PRIOR TO TESTING FOR BLOOD SUGAR AS DIRECTED Carlus Pavlov, MD  Active   alendronate (FOSAMAX) 70 MG tablet [id number]  TAKE 1 TABLET EVERY 7 DAYS ON AN EMPTY STOMACH WITH A FULL GLASS OF WATER  Patient taking differently: Take 70 mg by mouth once a week.   Kerri Perches, MD  Active Self, Pharmacy Records  amLODipine [facility]) 10 MG tablet [id number]  TAKE 1 TABLET EVERY DAY Dyann Kief, PA-C  Active   ascorbic acid (VITAMIN C) 500 MG tablet [id number]  Take 500 mg by mouth daily. [provider]  Active Self, Pharmacy Records  aspirin EC 81 MG tablet [id number]  Take 1 tablet (81 mg total) by mouth daily with breakfast. Shon Hale, MD  Active Self, Pharmacy Records  benzonatate (TESSALON) 200 MG capsule [id number]  Take 1 capsule (200 mg total) by mouth 3 (three) times daily as needed for cough. Waymon Budge, MD  Active   Blood Glucose Calibration (ACCU-CHEK GUIDE CONTROL) Anise Salvo [id number]  USE AS DIRECTED Carlus Pavlov, MD  Active Self, Pharmacy Records  blood glucose meter kit and supplies [id number]  Dispense based on  patient and insurance preference. Use up to four times daily as directed. (FOR ICD-10 E10.9, E11.9). Carlus Pavlov, MD  Active Self, Pharmacy Records  buPROPion (WELLBUTRIN XL) 150 MG 24 hr tablet [id number]  Take 1 tablet (150 mg total) by mouth every morning. Myrlene Broker, MD  Active   Calcium Carb-Cholecalciferol (CALTRATE 600+D3 SOFT) 600-20 MG-MCG CHEW [id number]  Chew 1 tablet by mouth daily at 12 noon. [provider]   Active Self, Pharmacy Records  cetirizine (ZYRTEC) 10 MG tablet [id number]  Take 1 tablet (10 mg total) by mouth daily. Donell Beers, FNP  Active Self, Pharmacy Records           Med Note Sharlene Dory   Fri Sep 23, 2021  8:18 AM)    Cholecalciferol (D3 PO) [id number]  Take 1 tablet by mouth daily. [provider]  Active Self, Pharmacy Records           Med Note Freida Busman, [facility]   Fri Sep 16, 2021  9:49 AM) Pt does not know how many units  clobetasol (TEMOVATE) 0.05 % external solution [id number]  Apply 1 Application topically 2 (two) times daily. Kerri Perches, MD  Active   Continuous Blood Gluc Sensor (FREESTYLE LIBRE 14 DAY SENSOR) [place] [id number]  1 each by Does not apply route every 14 (fourteen) days. Change every 2 weeks  Patient not taking: Reported on 07/07/2022   Carlus Pavlov, MD  Active Self, Pharmacy Records  diazepam (VALIUM) 5 MG tablet [id number]  Take 1 tablet (5 mg total) by mouth once as needed (MRI claustrophobia). Henreitta Leber, MD  Active   Docusate Sodium (DSS) 100 MG CAPS [id number]  Take 1 tablet by mouth in the morning and at bedtime. [provider]  Active Self, Pharmacy Records  DROPLET PEN NEEDLES 31G X 8 MM MISC [id number]  USE FOR INJECTING INSULIN 4 TIMES DAILY. Carlus Pavlov, MD  Active Self, Pharmacy Records  Elastic Bandages & Supports (ADJUSTABLE ARM [place]) [place] [id number]  L arm sling Kerri Perches, MD  Active   ezetimibe (ZETIA) 10 MG tablet [id number]  TAKE 1 TABLET EVERY DAY Kerri Perches, MD  Active   hydrOXYzine (ATARAX) 10 MG tablet [id number]  Take one tablet by mouth once daily, as needed, for excessive itching Kerri Perches, MD  Active   insulin aspart (NOVOLOG FLEXPEN) 100 UNIT/ML FlexPen [id number]  Inject 5 Units into the skin 3 (three) times daily with meals. Give if eats 50% or more of meal. Carlus Pavlov, MD  Active   ipratropium (ATROVENT) 0.03 % nasal spray [id number]  Place 2 sprays into  both nostrils every 12 (twelve) hours. Glenford Bayley, NP  Active   lactobacillus acidophilus (BACID) TABS tablet [id number]  Take 2 tablets by mouth 3 (three) times daily. [provider]  Active Self, Pharmacy Records  lamoTRIgine (LAMICTAL) 100 MG tablet [id number]  Take 1 tablet (100 mg total) by mouth 2 (two) times daily. Myrlene Broker, MD  Active   levothyroxine (SYNTHROID) 50 MCG tablet [id number]  TAKE 1 TABLET DAILY SIX DAYS A WEEK AND 1/2 TABLET ON ONE DAY A WEEK Carlus Pavlov, MD  Active   LORazepam (ATIVAN) 0.5 MG tablet [id number]  Take 1 tablet (0.5 mg total) by mouth 2 (two) times daily. Myrlene Broker, MD  Active   methocarbamol (ROBAXIN-750) 750 MG tablet [id number]  Take 1 tablet (750 mg total) by mouth every 8 (eight) hours as needed for muscle spasms. Barbaraann Faster,  Gasper Lloyd, MD  Active   metoprolol tartrate (LOPRESSOR) 50 MG tablet [id number]  TAKE 1 TABLET TWICE DAILY (NEED MD APPOINTMENT)  Patient taking differently: Take 50 mg by mouth 2 (two) times daily.   Ellsworth Lennox, PA-C  Active Self, Pharmacy Records           Med Note Daphine Deutscher, Sondra Come   Fri Sep 23, 2021  8:18 AM)    Misc. Devices (MATTRESS PAD) MISC [id number]  FIRM MATTRESS PAD for hospital bed x 1 Kerri Perches, MD  Active Self, Pharmacy Records  montelukast [facility]) 10 MG tablet [id number]  TAKE 1 TABLET EVERY DAY Kerri Perches, MD  Active   Multiple Vitamins-Minerals (MULTIVITAMIN GUMMIES ADULT PO) [id number]  Take 1 tablet by mouth daily. [provider]  Active Self, Pharmacy Records  MYRBETRIQ 25 [place] GE95 tablet [id number]  TAKE 1 TABLET EVERY DAY Kerri Perches, MD  Active   nicotine (NICODERM CQ) 21 mg/24hr patch [id number]  Place 1 patch (21 mg total) onto the skin daily.  Patient not taking: Reported on 07/07/2022   Waymon Budge, MD  Active   Omega-3 Fatty Acids (FISH OIL PO) [id number]  Take 1 capsule by mouth daily. [provider]  Active Self, Pharmacy  Records  oxyCODONE-acetaminophen (PERCOCET) 10-325 MG tablet [id number]  Take 1 tablet by mouth every 8 (eight) hours as needed for pain. [provider]  Active Self, Pharmacy Records           Med Note Sharlene Dory   Fri Sep 23, 2021  8:17 AM)    potassium chloride (KLOR-CON) 10 MEQ tablet [id number]  Take 1 tablet (10 mEq total) by mouth 2 (two) times daily. Kerri Perches, MD  Active   pregabalin (LYRICA) 75 MG capsule [id number]  Take 1 capsule (75 mg total) by mouth daily. Elliot Cousin, MD  Active Self, Pharmacy Records           Med Note Daphine Deutscher, Sondra Come   Fri Sep 23, 2021  8:17 AM)    RABEprazole (ACIPHEX) 20 MG tablet [id number]  TAKE 1 TABLET TWICE DAILY Pyrtle, Carie Caddy, MD  Active Self, Pharmacy Records  RESTASIS 0.05 % ophthalmic emulsion [id number]  Place 2 drops into both eyes daily. [provider]  Active Self, Pharmacy Records           Med Note Sharlene Dory   Fri Sep 23, 2021  8:17 AM)    risperiDONE (RISPERDAL) 0.5 MG tablet [id number]  Take 1 tablet (0.5 mg total) by mouth at bedtime. Myrlene Broker, MD  Active   rosuvastatin (CRESTOR) 5 MG tablet [id number]  TAKE 1 TABLET AT BEDTIME Kerri Perches, MD  Active   sertraline (ZOLOFT) 100 MG tablet [id number]  Take 1 tablet (100 mg total) by mouth every morning. Myrlene Broker, MD  Active   [facility] injection [id number]   [provider]  Active   spironolactone (ALDACTONE) 50 MG tablet [id number]  TAKE 1 TABLET (50 MG TOTAL) BY MOUTH DAILY. DISCONTINUE SPIRONOLACTONE 25MG  Kerri Perches, MD  Active   telmisartan (MICARDIS) 20 MG tablet [id number]  Take 10 mg by mouth daily. [provider]  Active   TOUJEO MAX SOLOSTAR 300 UNIT/ML Solostar Pen [id number]  INJECT 20-26 UNITS INTO THE SKIN DAILY. Carlus Pavlov, MD  Active   traZODone (DESYREL) 150 MG tablet [id number]  Take 1 tablet (150 mg total) by mouth at bedtime. Myrlene Broker, MD  Active   umeclidinium-vilanterol  (ANORO ELLIPTA) 62.5-25 MCG/ACT AEPB [id number]  Inhale 1 puff into the lungs daily. Jetty Duhamel D, MD  Active   vitamin E 180 MG (400 UNITS) capsule [id number]  Take 400 Units by mouth daily. [provider]  Active Self, Pharmacy Records  Med List Note Rolene Course 04/10/17 7829): Medications have been confirmed via pharmacy records \\[facility] All Medications [phone]            Home Care and Equipment/Supplies: Were Home Health Services Ordered?: No  Functional Questionnaire: Do you need assistance with bathing/showering or dressing?: Yes (has shower chair) Do you need assistance with meal preparation?: No Do you need assistance with eating?: No Do you have difficulty maintaining continence: No Do you need assistance with getting out of bed/getting out of a chair/moving?: Yes (uses walker and cane) Do you have difficulty managing or taking your medications?: No  Follow up appointments reviewed: PCP Follow-up appointment confirmed?: Yes Date of PCP follow-up appointment?: 08/03/22 Follow-up Provider: Syliva Overman Specialist [facility] Follow-up appointment confirmed?: No (ED visit/ pt not instructed to follow up with specialist) Do you need transportation to your follow-up appointment?: No Do you understand care options if your condition(s) worsen?: Yes-patient verbalized understanding    Irving Shows [facility], BSN RN Case Manager [facility] [phone]

## 2022-07-31 ENCOUNTER — Other Ambulatory Visit: Payer: Self-pay

## 2022-07-31 MED ORDER — UNABLE TO FIND: 0 refills | Status: DC

## 2022-07-31 NOTE — Telephone Encounter (Signed)
Order faxed to below number for G5 mattress

## 2022-08-01 ENCOUNTER — Inpatient Hospital Stay: Payer: Medicare HMO | Attending: Hematology

## 2022-08-01 ENCOUNTER — Inpatient Hospital Stay: Payer: Medicare HMO

## 2022-08-01 VITALS — BP 160/67 | HR 60 | Temp 97.7°F | Resp 17 | Wt 169.0 lb

## 2022-08-01 DIAGNOSIS — D638 Anemia in other chronic diseases classified elsewhere: Secondary | ICD-10-CM

## 2022-08-01 DIAGNOSIS — N1832 Chronic kidney disease, stage 3b: Secondary | ICD-10-CM

## 2022-08-01 DIAGNOSIS — D631 Anemia in chronic kidney disease: Secondary | ICD-10-CM | POA: Diagnosis not present

## 2022-08-01 DIAGNOSIS — Z79899 Other long term (current) drug therapy: Secondary | ICD-10-CM | POA: Diagnosis not present

## 2022-08-01 DIAGNOSIS — I129 Hypertensive chronic kidney disease with stage 1 through stage 4 chronic kidney disease, or unspecified chronic kidney disease: Secondary | ICD-10-CM | POA: Insufficient documentation

## 2022-08-01 LAB — CBC
HCT: 35 % — ABNORMAL LOW (ref 36.0–46.0)
Hemoglobin: 10.1 g/dL — ABNORMAL LOW (ref 12.0–15.0)
MCH: 22.9 pg — ABNORMAL LOW (ref 26.0–34.0)
MCHC: 28.9 g/dL — ABNORMAL LOW (ref 30.0–36.0)
MCV: 79.2 fL — ABNORMAL LOW (ref 80.0–100.0)
Platelets: 244 10*3/uL (ref 150–400)
RBC: 4.42 MIL/uL (ref 3.87–5.11)
RDW: 14.9 % (ref 11.5–15.5)
WBC: 6.3 10*3/uL (ref 4.0–10.5)
nRBC: 0 % (ref 0.0–0.2)

## 2022-08-01 MED ORDER — EPOETIN ALFA 10000 UNIT/ML IJ SOLN
10000.0000 [IU] | Freq: Once | INTRAMUSCULAR | Status: AC
  Administered 2022-08-01: 10000 [IU] via SUBCUTANEOUS
  Filled 2022-08-01: qty 1

## 2022-08-01 NOTE — Patient Instructions (Signed)
[facility]  Discharge Instructions: Thank you for choosing [facility] to provide your oncology and hematology care.  If you have a lab appointment with the Cancer Center - please note that after April 8th, 2024, all labs will be drawn in the cancer center.  You do not have to check in or register with the main entrance as you have in the past but will complete your check-in in the cancer center.  Wear comfortable clothing and clothing appropriate for easy access to any Portacath or PICC line.   We strive to give you quality time with your provider. You may need to reschedule your appointment if you arrive late (15 or more minutes).  Arriving late affects you and other patients whose appointments are after yours.  Also, if you miss three or more appointments without notifying the office, you may be dismissed from the clinic at the provider's discretion.      For prescription refill requests, have your pharmacy contact our office and allow 72 hours for refills to be completed.    Today you received the following Retacrit, return as scheduled.   To help prevent nausea and vomiting after your treatment, we encourage you to take your nausea medication as directed.  BELOW ARE SYMPTOMS THAT SHOULD BE REPORTED IMMEDIATELY: *FEVER GREATER THAN 100.4 F (38 C) OR HIGHER *CHILLS OR SWEATING *NAUSEA AND VOMITING THAT IS NOT CONTROLLED WITH YOUR NAUSEA MEDICATION *UNUSUAL SHORTNESS OF BREATH *UNUSUAL BRUISING OR BLEEDING *URINARY PROBLEMS (pain or burning when urinating, or frequent urination) *BOWEL PROBLEMS (unusual diarrhea, constipation, pain near the anus) TENDERNESS IN MOUTH AND THROAT WITH OR WITHOUT PRESENCE OF ULCERS (sore throat, sores in mouth, or a toothache) UNUSUAL RASH, SWELLING OR PAIN  UNUSUAL VAGINAL DISCHARGE OR ITCHING   Items with * indicate a potential emergency and should be followed up as soon as possible or go to the Emergency Department if  any problems should occur.  Please show the CHEMOTHERAPY ALERT CARD or IMMUNOTHERAPY ALERT CARD at check-in to the Emergency Department and triage nurse.  Should you have questions after your visit or need to cancel or reschedule your appointment, please contact [facility] [phone]  and follow the prompts.  Office hours are 8:00 a.m. to 4:30 p.m. Monday - Friday. Please note that voicemails left after 4:00 p.m. may not be returned until the following business day.  We are closed weekends and major holidays. You have access to a nurse at all times for urgent questions. Please call the main number to the clinic [phone] and follow the prompts.  For any non-urgent questions, you may also contact your provider using MyChart. We now offer e-Visits for anyone 18 and older to request care online for non-urgent symptoms. For details visit [email or web address].   Also download the MyChart app! Go to the app store, search "MyChart", open the app, select [facility], and log in with your MyChart username and password.   

## 2022-08-01 NOTE — Progress Notes (Signed)
Patient presents today for Retacrit injection, BP 166/67, patient reports having pain in shoulders from fall. Hemoglobin reviewed prior to administration. VSS tolerated without incident or complaint. See MAR for details. Patient stable during and after injection. Patient discharged in satisfactory condition with no s/s of distress noted.

## 2022-08-03 ENCOUNTER — Ambulatory Visit (INDEPENDENT_AMBULATORY_CARE_PROVIDER_SITE_OTHER): Payer: Medicare HMO | Admitting: Family Medicine

## 2022-08-03 ENCOUNTER — Encounter: Payer: Self-pay | Admitting: Family Medicine

## 2022-08-03 VITALS — Ht 59.0 in | Wt 171.0 lb

## 2022-08-03 DIAGNOSIS — F17210 Nicotine dependence, cigarettes, uncomplicated: Secondary | ICD-10-CM

## 2022-08-03 DIAGNOSIS — R296 Repeated falls: Secondary | ICD-10-CM | POA: Diagnosis not present

## 2022-08-03 DIAGNOSIS — F060 Psychotic disorder with hallucinations due to known physiological condition: Secondary | ICD-10-CM | POA: Diagnosis not present

## 2022-08-03 DIAGNOSIS — Z09 Encounter for follow-up examination after completed treatment for conditions other than malignant neoplasm: Secondary | ICD-10-CM

## 2022-08-03 DIAGNOSIS — R079 Chest pain, unspecified: Secondary | ICD-10-CM | POA: Diagnosis not present

## 2022-08-03 DIAGNOSIS — Z122 Encounter for screening for malignant neoplasm of respiratory organs: Secondary | ICD-10-CM

## 2022-08-03 DIAGNOSIS — Z720 Tobacco use: Secondary | ICD-10-CM

## 2022-08-03 NOTE — Progress Notes (Signed)
   [name]     MRN: [id number]      DOB: 1950-06-25  Chief Complaint  Patient presents with   Follow-up    Follow up fall over the weekend R side under breast pain    HPI [name] is here for follow up recent Ed visit on 07/27/2022, when she presented with right chest wall pain after being accidentally hit by her front door while opening it. Her Aide with her also reports several falls or near falls on a regular basis. [name] has upcoming left shoulder surgery ina couple of weeks and plans to recover at home, current Aide will leave at that time  Also of note she has several  hypoglycemic episodes and is on multiple medications that have drowsiness as a s/e  ROS Denies recent fever or chills. Denies sinus pressure, nasal congestion, ear pain or sore throat. Denies chest congestion, productive cough or wheezing. Denies chest pains, palpitations and leg swelling Denies abdominal pain, nausea, vomiting,diarrhea or constipation.   Denies dysuria, frequency, hesitancy or incontinence. Chronic  joint pain, swelling and limitation in mobility. Denies headaches, seizures, numbness, or tingling. Chronic  depression, anxiety or insomnia. Denies skin break down or rash.   PE  Ht 4\' 11"  (1.499 m)   Wt 171 lb 0.6 oz (77.6 kg)   SpO2 92%   BMI 34.55 kg/m   Patient alert and oriented and in no cardiopulmonary distress.  HEENT: No facial asymmetry, EOMI,     Neck supple .  Chest: Clear to auscultation bilaterally.Decreased air entryMildly tender over right chest wall, no bruising noted  CVS: S1, S2 no murmurs, no S3.Regular rate.  ABD: Soft non tender.   Ext: No edema  MS: decreased  ROM spine, shoulders, hips and knees.  Skin: Intact, no ulcerations or rash noted.  Psych: Good eye contact, normal affect. M not anxious or depressed appearing.  CNS: CN 2-12 intact, power,  normal throughout.no focal deficits noted.   Assessment & Plan  Right-sided chest pain New onset pain  following direct blunt trauma, still present but gradually lessening . 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 Stable treated by Psych  Tobacco user Asked:confirms currently smokes cigarettes Assess: Unwilling to set a quit date, but is cutting back Advise: needs to QUIT to reduce risk of cancer, cardio and cerebrovascular disease Assist: counseled for 5 minutes and literature provided Arrange: follow up in 2 to 4 months   DMII (diabetes mellitus, type 2) (HCC) Episoic hypoglycemia of which she is often unaware, at increased risk of fall,pt admits to dosing insuli differently and at a higher dose than prescribed, I explained this is dangerous and she needs to stop  Encounter for examination following treatment at hospital Patient in for follow up of recent ED visit. Discharge summary, and laboratory and radiology data are reviewed, and any questions or concerns  are discussed. Specific issues requiring follow up are specifically addressed.

## 2022-08-03 NOTE — Patient Instructions (Addendum)
F/u in 4 months, call if you  need me sooner  You are referred for a screening chest scan.  Please take your pre meal insulin as dosed, I believe you are taking too much insulin and your blood sugar is too low at times, which increases your risk of falls  Using the continuous blood sugar meter will also help to keep better control over your blood sugar  Thanks for choosing  Primary Care, we consider it a privelige to serve you.

## 2022-08-04 ENCOUNTER — Telehealth: Payer: Self-pay | Admitting: Family Medicine

## 2022-08-04 NOTE — Telephone Encounter (Signed)
Patient called can she be referred to a dermatology for a spot on her head, thick rash on the side of her head.

## 2022-08-06 ENCOUNTER — Encounter: Payer: Self-pay | Admitting: Family Medicine

## 2022-08-06 DIAGNOSIS — R079 Chest pain, unspecified: Secondary | ICD-10-CM | POA: Insufficient documentation

## 2022-08-06 NOTE — Assessment & Plan Note (Signed)
Home safety reviewed in the hope that falls or near falls will also decrease

## 2022-08-06 NOTE — Assessment & Plan Note (Signed)
Patient in for follow up of recent ED visit ?Discharge summary, and laboratory and radiology data are reviewed, and any questions or concerns  are discussed. ?Specific issues requiring follow up are specifically addressed. ? ?

## 2022-08-06 NOTE — Assessment & Plan Note (Signed)
Stable treated by Psych ?

## 2022-08-06 NOTE — Assessment & Plan Note (Signed)
Asked:confirms currently smokes cigarettes °Assess: Unwilling to set a quit date, but is cutting back °Advise: needs to QUIT to reduce risk of cancer, cardio and cerebrovascular disease °Assist: counseled for 5 minutes and literature provided °Arrange: follow up in 2 to 4 months ° °

## 2022-08-06 NOTE — Assessment & Plan Note (Signed)
Episoic hypoglycemia of which she is often unaware, at increased risk of fall,pt admits to dosing insuli differently and at a higher dose than prescribed, I explained this is dangerous and she needs to stop

## 2022-08-06 NOTE — Assessment & Plan Note (Signed)
New onset pain following direct blunt trauma, still present but gradually lessening . Nofracture on CXR, pt reassured

## 2022-08-08 ENCOUNTER — Encounter (HOSPITAL_COMMUNITY)
Admission: RE | Admit: 2022-08-08 | Discharge: 2022-08-08 | Disposition: A | Discharge status: Home / Self Care | Payer: Medicare HMO | Source: Ambulatory Visit | Attending: Orthopedic Surgery | Admitting: Orthopedic Surgery

## 2022-08-08 ENCOUNTER — Ambulatory Visit (HOSPITAL_COMMUNITY): Payer: Medicare HMO | Admitting: Psychiatry

## 2022-08-08 ENCOUNTER — Encounter (HOSPITAL_COMMUNITY): Payer: Self-pay

## 2022-08-08 ENCOUNTER — Other Ambulatory Visit (HOSPITAL_COMMUNITY): Payer: Self-pay | Admitting: Psychiatry

## 2022-08-08 ENCOUNTER — Other Ambulatory Visit: Payer: Self-pay | Admitting: Physician Assistant

## 2022-08-08 ENCOUNTER — Other Ambulatory Visit: Payer: Self-pay

## 2022-08-08 VITALS — BP 178/72 | HR 67 | Temp 98.9°F | Resp 18 | Ht 59.0 in | Wt 167.0 lb

## 2022-08-08 DIAGNOSIS — K769 Liver disease, unspecified: Secondary | ICD-10-CM | POA: Diagnosis not present

## 2022-08-08 DIAGNOSIS — Z01812 Encounter for preprocedural laboratory examination: Secondary | ICD-10-CM | POA: Insufficient documentation

## 2022-08-08 DIAGNOSIS — M25512 Pain in left shoulder: Secondary | ICD-10-CM | POA: Insufficient documentation

## 2022-08-08 DIAGNOSIS — N189 Chronic kidney disease, unspecified: Secondary | ICD-10-CM | POA: Insufficient documentation

## 2022-08-08 DIAGNOSIS — Z794 Long term (current) use of insulin: Secondary | ICD-10-CM | POA: Diagnosis not present

## 2022-08-08 DIAGNOSIS — Z87891 Personal history of nicotine dependence: Secondary | ICD-10-CM | POA: Insufficient documentation

## 2022-08-08 DIAGNOSIS — G4730 Sleep apnea, unspecified: Secondary | ICD-10-CM | POA: Diagnosis not present

## 2022-08-08 DIAGNOSIS — I129 Hypertensive chronic kidney disease with stage 1 through stage 4 chronic kidney disease, or unspecified chronic kidney disease: Secondary | ICD-10-CM | POA: Insufficient documentation

## 2022-08-08 DIAGNOSIS — E1122 Type 2 diabetes mellitus with diabetic chronic kidney disease: Secondary | ICD-10-CM | POA: Insufficient documentation

## 2022-08-08 DIAGNOSIS — Z01818 Encounter for other preprocedural examination: Secondary | ICD-10-CM

## 2022-08-08 DIAGNOSIS — E119 Type 2 diabetes mellitus without complications: Secondary | ICD-10-CM

## 2022-08-08 HISTORY — DX: Pneumonia, unspecified organism: J18.9

## 2022-08-08 HISTORY — DX: Cardiac murmur, unspecified: R01.1

## 2022-08-08 LAB — COMPREHENSIVE METABOLIC PANEL
ALT: 26 U/L (ref 0–44)
AST: 22 U/L (ref 15–41)
Albumin: 3.8 g/dL (ref 3.5–5.0)
Alkaline Phosphatase: 79 U/L (ref 38–126)
Anion gap: 7 (ref 5–15)
BUN: 23 mg/dL (ref 8–23)
CO2: 27 mmol/L (ref 22–32)
Calcium: 9 mg/dL (ref 8.9–10.3)
Chloride: 109 mmol/L (ref 98–111)
Creatinine, Ser: 0.97 mg/dL (ref 0.44–1.00)
GFR, Estimated: >60 mL/min (ref >60)
Glucose, Bld: 67 mg/dL — ABNORMAL LOW (ref 70–99)
Potassium: 3.7 mmol/L (ref 3.5–5.1)
Sodium: 143 mmol/L (ref 135–145)
Total Bilirubin: 0.6 mg/dL (ref 0.3–1.2)
Total Protein: 7.3 g/dL (ref 6.5–8.1)

## 2022-08-08 LAB — SURGICAL PCR SCREEN
MRSA, PCR: NEGATIVE
Staphylococcus aureus: NEGATIVE

## 2022-08-08 LAB — GLUCOSE, CAPILLARY: Glucose-Capillary: 79 mg/dL (ref 70–99)

## 2022-08-08 NOTE — Progress Notes (Addendum)
Patient brought in a medication list from her most recent office visit. She stated that she is unsure if she has all of them at home. Pharmacy came and plans to go over list and call patient at home if there are any questions. Patient agreed to plan.  COVID Vaccine Completed: yes  Date of COVID positive in last 90 days: no  PCP - Syliva Overman, MD Cardiologist - Armanda Magic, MD Pulmonologist- Jetty Duhamel, MD  Pulmonary clearance by Jetty Duhamel 06/20/22 in Epic  Cardiac clearance by Bernadene Person, NP 07/05/22 in Epic  Chest x-ray - 07/27/22 Epic EKG - 11/10/21 Epic Stress Test - 06/27/21 Epic ECHO - 02/22/15 Epic Cardiac Cath - 05/10/05 Epic Pacemaker/ICD device last checked: n/a Spinal Cord Stimulator: n/a  Bowel Prep - no  Sleep Study - yes CPAP - yes, every night   Fasting Blood Sugar - 120-177 Checks Blood Sugar 4 times a day  Last dose of GLP1 agonist-  N/A GLP1 instructions:  N/A   Last dose of SGLT-2 inhibitors-  N/A SGLT-2 instructions: N/A   Blood Thinner Instructions:  Time Aspirin Instructions: ASA 81, hold 5-7 days Last Dose:  Activity level: Denies chest pain or shortness of breath. Not able to go up stairs due to legs and back issues per pt. States she falls a lot, fall last week. Went to ED for this. Has an aide that helps with ADLs   Anesthesia review: HTN, pulmonary HTN, CHF, atherosclerosis of aorta, OSA, COPD, DM2, seizures, CKD, glucose on CMP 67  Patient denies shortness of breath, fever, cough and chest pain at PAT appointment  Patient verbalized understanding of instructions that were given to them at the PAT appointment. Patient was also instructed that they will need to review over the PAT instructions again at home before surgery.

## 2022-08-08 NOTE — Patient Instructions (Addendum)
SURGICAL WAITING ROOM VISITATION  Patients having surgery or a procedure may have no more than 2 support people in the waiting area - these visitors may rotate.    Children under the age of 56 must have an adult with them who is not the patient.  Due to an increase in RSV and influenza rates and associated hospitalizations, children ages 35 and under may not visit patients in [facility] hospitals.  If the patient needs to stay at the hospital during part of their recovery, the visitor guidelines for inpatient rooms apply. Pre-op nurse will coordinate an appropriate time for 1 support person to accompany patient in pre-op.  This support person may not rotate.    Please refer to the [facility] website for the visitor guidelines for Inpatients (after your surgery is over and you are in a regular room).    Your procedure is scheduled on: 08/18/22   Report to [facility] Main Entrance    Report to admitting at 10:30 AM   Call this number if you have problems the morning of surgery [phone]   Do not eat food :After Midnight.   After Midnight you may have the following liquids until 10:00 AM DAY OF SURGERY  Water Non-Citrus Juices (without pulp, NO RED-Apple, White grape, White cranberry) Black Coffee (NO MILK/CREAM OR CREAMERS, sugar ok)  Clear Tea (NO MILK/CREAM OR CREAMERS, sugar ok) regular and decaf                             Plain Jell-O (NO RED)                                           Fruit ices (not with fruit pulp, NO RED)                                     Popsicles (NO RED)                                                               Sports drinks like Gatorade (NO RED)                 The day of surgery:  Drink ONE (1) Pre-Surgery G2 at 10:00 AM the morning of surgery. Drink in one sitting. Do not sip.  This drink was given to you during your hospital  pre-op appointment visit. Nothing else to drink after completing the  Pre-Surgery G2.          If you  have questions, please contact your surgeon's office.   FOLLOW BOWEL PREP AND ANY ADDITIONAL PRE OP INSTRUCTIONS YOU RECEIVED FROM YOUR SURGEON'S OFFICE!!!     Oral Hygiene is also important to reduce your risk of infection.                                    Remember - BRUSH YOUR TEETH THE MORNING OF SURGERY WITH YOUR REGULAR TOOTHPASTE  DENTURES WILL BE REMOVED  PRIOR TO SURGERY PLEASE DO NOT APPLY "Poly grip" OR ADHESIVES!!!   Take these medicines the morning of surgery with A SIP OF WATER: Tylenol, Inhalers, Amlodipine, Wellbutrin, Zyrtec, Zetia, Lamictal, Levothyroxine, Ativan, Metoprolol, Singulair, Percocet, Lyrica, Sertraline  DO NOT TAKE ANY ORAL DIABETIC MEDICATIONS DAY OF YOUR SURGERY  How to Manage Your Diabetes Before and After Surgery  Why is it important to control my blood sugar before and after surgery? Improving blood sugar levels before and after surgery helps healing and can limit problems. A way of improving blood sugar control is eating a healthy diet by:  Eating less sugar and carbohydrates  Increasing activity/exercise  Talking with your doctor about reaching your blood sugar goals High blood sugars (greater than 180 mg/dL) can raise your risk of infections and slow your recovery, so you will need to focus on controlling your diabetes during the weeks before surgery. Make sure that the doctor who takes care of your diabetes knows about your planned surgery including the date and location.  How do I manage my blood sugar before surgery? Check your blood sugar at least 4 times a day, starting 2 days before surgery, to make sure that the level is not too high or low. Check your blood sugar the morning of your surgery when you wake up and every 2 hours until you get to the Short Stay unit. If your blood sugar is less than 70 mg/dL, you will need to treat for low blood sugar: Do not take insulin. Treat a low blood sugar (less than 70 mg/dL) with  cup of clear juice  (cranberry or apple), 4 glucose tablets, OR glucose gel. Recheck blood sugar in 15 minutes after treatment (to make sure it is greater than 70 mg/dL). If your blood sugar is not greater than 70 mg/dL on recheck, call [phone] for further instructions. Report your blood sugar to the short stay nurse when you get to Short Stay.  If you are admitted to the hospital after surgery: Your blood sugar will be checked by the staff and you will probably be given insulin after surgery (instead of oral diabetes medicines) to make sure you have good blood sugar levels. The goal for blood sugar control after surgery is 80-180 mg/dL.   WHAT DO I DO ABOUT MY DIABETES MEDICATION?  Do not take oral diabetes medicines (pills) the morning of surgery.  THE DAY BEFORE SURGERY, take Novolog before meals as prescribed. Take 50% of Toujeo at bedtime.     THE MORNING OF SURGERY, do not take Novolog unless blood sugar is greater than 220, then take 50% of dose.  If your CBG is greater than 220 mg/dL, you may take  of your sliding scale  (correction) dose of insulin.   Reviewed and Endorsed by [facility] Patient Education Committee, August 2015  Bring CPAP mask and tubing day of surgery.                              You may not have any metal on your body including hair pins, jewelry, and body piercing             Do not wear make-up, lotions, powders, perfumes, or deodorant  Do not wear nail polish including gel and S&S, artificial/acrylic nails, or any other type of covering on natural nails including finger and toenails. If you have artificial nails, gel coating, etc. that needs to be removed by a nail  salon please have this removed prior to surgery or surgery may need to be canceled/ delayed if the surgeon/ anesthesia feels like they are unable to be safely monitored.   Do not shave  48 hours prior to surgery.    Do not bring valuables to the hospital. [facility] IS NOT             RESPONSIBLE   FOR  VALUABLES.   Contacts, glasses, dentures or bridgework may not be worn into surgery.   Bring small overnight bag day of surgery.   DO NOT BRING YOUR HOME MEDICATIONS TO THE HOSPITAL. PHARMACY WILL DISPENSE MEDICATIONS LISTED ON YOUR MEDICATION LIST TO YOU DURING YOUR ADMISSION IN THE HOSPITAL!   Special Instructions: Bring a copy of your healthcare power of attorney and living will documents the day of surgery if you haven't scanned them before.              Please read over the following fact sheets you were given: IF YOU HAVE QUESTIONS ABOUT YOUR PRE-OP INSTRUCTIONS PLEASE CALL [phone] [facility]   If you received a COVID test during your pre-op visit  it is requested that you wear a mask when out in public, stay away from anyone that may not be feeling well and notify your surgeon if you develop symptoms. If you test positive for Covid or have been in contact with anyone that has tested positive in the last 10 days please notify you surgeon.      Pre-operative 5 CHG Bath Instructions   You can play a key role in reducing the risk of infection after surgery. Your skin needs to be as free of germs as possible. You can reduce the number of germs on your skin by washing with CHG (chlorhexidine gluconate) soap before surgery. CHG is an antiseptic soap that kills germs and continues to kill germs even after washing.   DO NOT use if you have an allergy to chlorhexidine/CHG or antibacterial soaps. If your skin becomes reddened or irritated, stop using the CHG and notify one of our RNs at [phone].   Please shower with the CHG soap starting 4 days before surgery using the following schedule:     Please keep in mind the following:  DO NOT shave, including legs and underarms, starting the day of your first shower.   You may shave your face at any point before/day of surgery.  Place clean sheets on your bed the day you start using CHG soap. Use a clean washcloth (not used since being  washed) for each shower. DO NOT sleep with pets once you start using the CHG.   CHG Shower Instructions:  If you choose to wash your hair and private area, wash first with your normal shampoo/soap.  After you use shampoo/soap, rinse your hair and body thoroughly to remove shampoo/soap residue.  Turn the water OFF and apply about 3 tablespoons (45 ml) of CHG soap to a CLEAN washcloth.  Apply CHG soap ONLY FROM YOUR NECK DOWN TO YOUR TOES (washing for 3-5 minutes)  DO NOT use CHG soap on face, private areas, open wounds, or sores.  Pay special attention to the area where your surgery is being performed.  If you are having back surgery, having someone wash your back for you may be helpful. Wait 2 minutes after CHG soap is applied, then you may rinse off the CHG soap.  Pat dry with a clean towel  Put on clean clothes/pajamas   If you  choose to wear lotion, please use ONLY the CHG-compatible lotions on the back of this paper.     Additional instructions for the day of surgery: DO NOT APPLY any lotions, deodorants, cologne, or perfumes.   Put on clean/comfortable clothes.  Brush your teeth.  Ask your nurse before applying any prescription medications to the skin.      CHG Compatible Lotions   Aveeno Moisturizing lotion  Cetaphil Moisturizing Cream  Cetaphil Moisturizing Lotion  Clairol Herbal Essence Moisturizing Lotion, Dry Skin  Clairol Herbal Essence Moisturizing Lotion, Extra Dry Skin  Clairol Herbal Essence Moisturizing Lotion, Normal Skin  Curel Age Defying Therapeutic Moisturizing Lotion with Alpha Hydroxy  Curel Extreme Care Body Lotion  Curel Soothing Hands Moisturizing Hand Lotion  Curel Therapeutic Moisturizing Cream, Fragrance-Free  Curel Therapeutic Moisturizing Lotion, Fragrance-Free  Curel Therapeutic Moisturizing Lotion, Original Formula  Eucerin Daily Replenishing Lotion  Eucerin Dry Skin Therapy Plus Alpha Hydroxy Crme  Eucerin Dry Skin Therapy Plus Alpha  Hydroxy Lotion  Eucerin Original Crme  Eucerin Original Lotion  Eucerin Plus Crme Eucerin Plus Lotion  Eucerin TriLipid Replenishing Lotion  Keri Anti-Bacterial Hand Lotion  Keri Deep Conditioning Original Lotion Dry Skin Formula Softly Scented  Keri Deep Conditioning Original Lotion, Fragrance Free Sensitive Skin Formula  Keri Lotion Fast Absorbing Fragrance Free Sensitive Skin Formula  Keri Lotion Fast Absorbing Softly Scented Dry Skin Formula  Keri Original Lotion  Keri Skin Renewal Lotion Keri Silky Smooth Lotion  Keri Silky Smooth Sensitive Skin Lotion  Nivea Body Creamy Conditioning Oil  Nivea Body Extra Enriched Lotion  Nivea Body Original Lotion  Nivea Body Sheer Moisturizing Lotion Nivea Crme  Nivea Skin Firming Lotion  NutraDerm 30 Skin Lotion  NutraDerm Skin Lotion  NutraDerm Therapeutic Skin Cream  NutraDerm Therapeutic Skin Lotion  ProShield Protective Hand Cream  Provon moisturizing lotion     Incentive Spirometer  An incentive spirometer is a tool that can help keep your lungs clear and active. This tool measures how well you are filling your lungs with each breath. Taking long deep breaths may help reverse or decrease the chance of developing breathing (pulmonary) problems (especially infection) following: A long period of time when you are unable to move or be active. BEFORE THE PROCEDURE  If the spirometer includes an indicator to show your best effort, your nurse or respiratory therapist will set it to a desired goal. If possible, sit up straight or lean slightly forward. Try not to slouch. Hold the incentive spirometer in an upright position. INSTRUCTIONS FOR USE  Sit on the edge of your bed if possible, or sit up as far as you can in bed or on a chair. Hold the incentive spirometer in an upright position. Breathe out normally. Place the mouthpiece in your mouth and seal your lips tightly around it. Breathe in slowly and as deeply as possible, raising  the piston or the ball toward the top of the column. Hold your breath for 3-5 seconds or for as long as possible. Allow the piston or ball to fall to the bottom of the column. Remove the mouthpiece from your mouth and breathe out normally. Rest for a few seconds and repeat Steps 1 through 7 at least 10 times every 1-2 hours when you are awake. Take your time and take a few normal breaths between deep breaths. The spirometer may include an indicator to show your best effort. Use the indicator as a goal to work toward during each repetition. After each set of 10  deep breaths, practice coughing to be sure your lungs are clear. If you have an incision (the cut made at the time of surgery), support your incision when coughing by placing a pillow or rolled up towels firmly against it. Once you are able to get out of bed, walk around indoors and cough well. You may stop using the incentive spirometer when instructed by your caregiver.  RISKS AND COMPLICATIONS Take your time so you do not get dizzy or light-headed. If you are in pain, you may need to take or ask for pain medication before doing incentive spirometry. It is harder to take a deep breath if you are having pain. AFTER USE Rest and breathe slowly and easily. It can be helpful to keep track of a log of your progress. Your caregiver can provide you with a simple table to help with this. If you are using the spirometer at home, follow these instructions: SEEK MEDICAL CARE IF:  You are having difficultly using the spirometer. You have trouble using the spirometer as often as instructed. Your pain medication is not giving enough relief while using the spirometer. You develop fever of 100.5 F (38.1 C) or higher. SEEK IMMEDIATE MEDICAL CARE IF:  You cough up bloody sputum that had not been present before. You develop fever of 102 F (38.9 C) or greater. You develop worsening pain at or near the incision site. MAKE SURE YOU:  Understand these  instructions. Will watch your condition. Will get help right away if you are not doing well or get worse. Document Released: 07/10/2006 Document Revised: 05/22/2011 Document Reviewed: 09/10/2006 Exit[employer] Patient Information 2014 [place] [employer].   ________________________________________________________________________  [facility]- Preparing for Total Shoulder Arthroplasty    Before surgery, you can play an important role. Because skin is not sterile, your skin needs to be as free of germs as possible. You can reduce the number of germs on your skin by using the following products. Benzoyl Peroxide Gel Reduces the number of germs present on the skin Applied twice a day to shoulder area starting two days before surgery    ==================================================================  Please follow these instructions carefully:  BENZOYL PEROXIDE 5% GEL  Please do not use if you have an allergy to benzoyl peroxide.   If your skin becomes reddened/irritated stop using the benzoyl peroxide.  Starting two days before surgery, apply as follows: Apply benzoyl peroxide in the morning and at night. Apply after taking a shower. If you are not taking a shower clean entire shoulder front, back, and side along with the armpit with a clean wet washcloth.  Place a quarter-sized dollop on your shoulder and rub in thoroughly, making sure to cover the front, back, and side of your shoulder, along with the armpit.   2 days before ____ AM   ____ PM              1 day before ____ AM   ____ PM                         Do this twice a day for two days.  (Last application is the night before surgery, AFTER using the CHG soap as described below).  Do NOT apply benzoyl peroxide gel on the day of surgery.

## 2022-08-09 ENCOUNTER — Ambulatory Visit (INDEPENDENT_AMBULATORY_CARE_PROVIDER_SITE_OTHER): Payer: Medicare HMO | Admitting: Family Medicine

## 2022-08-09 ENCOUNTER — Encounter: Payer: Self-pay | Admitting: Family Medicine

## 2022-08-09 VITALS — BP 169/70 | HR 64 | Ht 59.0 in | Wt 167.0 lb

## 2022-08-09 DIAGNOSIS — L729 Follicular cyst of the skin and subcutaneous tissue, unspecified: Secondary | ICD-10-CM | POA: Diagnosis not present

## 2022-08-09 DIAGNOSIS — L089 Local infection of the skin and subcutaneous tissue, unspecified: Secondary | ICD-10-CM | POA: Diagnosis not present

## 2022-08-09 DIAGNOSIS — Z748 Other problems related to care provider dependency: Secondary | ICD-10-CM

## 2022-08-09 LAB — HEMOGLOBIN A1C
Hgb A1c MFr Bld: 5.2 % (ref 4.8–5.6)
Mean Plasma Glucose: 103 mg/dL

## 2022-08-09 MED ORDER — DOXYCYCLINE HYCLATE 100 MG PO TABS
100.0000 mg | ORAL_TABLET | Freq: Two times a day (BID) | ORAL | 0 refills | Status: AC
Start: 2022-08-09 — End: 2022-08-19

## 2022-08-09 MED ORDER — HYDROXYZINE HCL 10 MG PO TABS: ORAL_TABLET | ORAL | 0 refills | Status: DC

## 2022-08-09 NOTE — Patient Instructions (Signed)
        Great to see you today.   - Please take medications as prescribed. - Follow up with your primary health provider if any health concerns arises. - If symptoms worsen please contact your primary care provider and/or visit the emergency department.  

## 2022-08-09 NOTE — Progress Notes (Unsigned)
Patient Office Visit   Subjective   Patient ID: [name], female    DOB: Nov 07, 1950  Age: 72 y.o. MRN: [id number]  CC:  Chief Complaint  Patient presents with   Follow-up    Follow up, knots on head hurt and itch    HPI [name] 72 year old female, presents to the clinic with scalp cyst. She  has a past medical history of Allergy, Anemia, Anemia in chronic kidney disease (CKD) (08/10/2021), Anemia in chronic kidney disease (CKD) (10/12/2021), Anxiety, Arthritis, Assistance needed for mobility, Bipolar disorder (HCC), Blood transfusion, Brain tumor (HCC), Cancer (HCC), Carpal tunnel syndrome of right wrist (05/23/2011), Cervical disc disorder with radiculopathy of cervical region (10/31/2012), Chronic back pain, Chronic idiopathic constipation, Chronic neck and back pain, Colon polyps, COPD (chronic obstructive pulmonary disease) with chronic bronchitis (09/16/2013), Diabetes mellitus, Diverticulosis, Fibromyalgia, Frequent falls, GERD (gastroesophageal reflux disease), Glaucoma, Gum symptoms, Heart murmur, Hiatal hernia, Hyperlipidemia, Hypertension, Hypothyroidism, IBS (irritable bowel syndrome), Insomnia, Major depression, recurrent (HCC), 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   HPI    Outpatient Encounter Medications as of 08/09/2022  Medication Sig   ACCU-CHEK GUIDE test strip USE TO CHECK BLOOD SUGAR FOUR TIMES A DAY AND AS NEEDED (NEED MD APPOINTMENT FOR ANY FURTHER REFILLS)   Accu-Chek Softclix Lancets lancets TEST BLOOD SUGAR THREE TIMES DAILY AS DIRECTED   acetaminophen (TYLENOL) 325 MG tablet Take 650 mg by mouth every 6 (six) hours as  needed for mild pain, moderate pain or headache.   albuterol (PROVENTIL) (2.5 MG/3ML) 0.083% nebulizer solution Take 3 mLs (2.5 mg total) by nebulization every 6 (six) hours as needed for wheezing or shortness of breath.   albuterol (VENTOLIN HFA) 108 (90 Base) MCG/ACT inhaler INHALE 1 TO 2 PUFFS EVERY 6 HOURS AS NEEDED FOR WHEEZING, SHORTNESS OF BREATH (Patient taking differently: Inhale 1-2 puffs into the lungs every 6 (six) hours as needed for wheezing or shortness of breath.)   Alcohol Swabs (DROPSAFE ALCOHOL PREP) 70 % PADS USE TO CLEAN FINGER PRIOR TO TESTING FOR BLOOD SUGAR AS DIRECTED   alendronate (FOSAMAX) 70 MG tablet TAKE 1 TABLET EVERY 7 DAYS ON AN EMPTY STOMACH WITH A FULL GLASS OF WATER (Patient taking differently: Take 70 mg by mouth once a week.)   amLODipine (NORVASC) 10 MG tablet TAKE 1 TABLET EVERY DAY (Patient taking differently: Take 10 mg by mouth daily.)   ascorbic acid (VITAMIN C) 500 MG tablet Take 500 mg by mouth daily.   aspirin EC 81 MG tablet Take 1 tablet (81 mg total) by mouth daily with breakfast.   benzonatate (TESSALON) 200 MG capsule Take 1 capsule (200 mg total) by mouth 3 (three) times daily as needed for cough.   Blood Glucose Calibration (ACCU-CHEK GUIDE CONTROL) LIQD USE AS DIRECTED   blood glucose meter kit and supplies Dispense based on patient and insurance preference. Use up to four times daily as directed. (FOR ICD-10 E10.9, E11.9).   buPROPion (WELLBUTRIN XL) 150 MG 24 hr tablet Take 1 tablet (150 mg total) by mouth every morning.   Calcium Carb-Cholecalciferol (CALTRATE 600+D3 SOFT) 600-20 MG-MCG CHEW Chew 1 tablet by mouth daily.   cetirizine (ZYRTEC) 10 MG tablet Take 1 tablet (10 mg total) by mouth daily.  Cholecalciferol (D3 PO) Take 1 tablet by mouth daily.   clobetasol (TEMOVATE) 0.05 % external solution Apply 1 Application topically 2 (two) times daily. (Patient taking differently: Apply 1 Application topically See admin instructions. Apply to  affected area(s) 2 times a day)   Continuous Blood Gluc Sensor (FREESTYLE LIBRE 14 DAY SENSOR) MISC 1 each by Does not apply route every 14 (fourteen) days. Change every 2 weeks (Patient taking differently: Inject 1 Device into the skin every 14 (fourteen) days.)   diazepam (VALIUM) 5 MG tablet Take 1 tablet (5 mg total) by mouth once as needed (MRI claustrophobia).   Docusate Sodium (DSS) 100 MG CAPS Take 100 mg by mouth in the morning and at bedtime.   doxycycline (VIBRA-TABS) 100 MG tablet Take 1 tablet (100 mg total) by mouth 2 (two) times daily for 10 days.   DROPLET PEN NEEDLES 31G X 8 MM MISC USE FOR INJECTING INSULIN 4 TIMES DAILY.   Elastic Bandages & Supports (ADJUSTABLE ARM SLING) MISC L arm sling   ezetimibe (ZETIA) 10 MG tablet TAKE 1 TABLET EVERY DAY (Patient taking differently: Take 10 mg by mouth daily.)   insulin aspart (NOVOLOG FLEXPEN) 100 UNIT/ML FlexPen Inject 5 Units into the skin 3 (three) times daily with meals. Give if eats 50% or more of meal. (Patient taking differently: Inject 5 Units into the skin 3 (three) times daily with meals. Inject 5 units into the skin three times a day with meals and use only if 50% or more of a meal is eaten)   ipratropium (ATROVENT) 0.03 % nasal spray Place 2 sprays into both nostrils every 12 (twelve) hours.   lactobacillus acidophilus (BACID) TABS tablet Take 2 tablets by mouth 3 (three) times daily.   lamoTRIgine (LAMICTAL) 100 MG tablet Take 1 tablet (100 mg total) by mouth 2 (two) times daily.   levothyroxine (SYNTHROID) 50 MCG tablet TAKE 1 TABLET DAILY SIX DAYS A WEEK AND 1/2 TABLET ON ONE DAY A WEEK (Patient taking differently: Take 25-50 mcg by mouth See admin instructions. Take 25 mcg by mouth before breakfast on Sunday and 50 mcg on Mon/Tues/Wed/Thurs/Fri/Sat)   LORazepam (ATIVAN) 0.5 MG tablet Take 1 tablet (0.5 mg total) by mouth 2 (two) times daily.   methocarbamol (ROBAXIN-750) 750 MG tablet Take 1 tablet (750 mg total) by mouth  every 8 (eight) hours as needed for muscle spasms.   metoprolol tartrate (LOPRESSOR) 50 MG tablet TAKE 1 TABLET TWICE DAILY (NEED MD APPOINTMENT) (Patient taking differently: Take 50 mg by mouth 2 (two) times daily.)   Misc. Devices (MATTRESS PAD) MISC FIRM MATTRESS PAD for hospital bed x 1   montelukast (SINGULAIR) 10 MG tablet TAKE 1 TABLET EVERY DAY (Patient taking differently: Take 10 mg by mouth daily.)   Multiple Vitamins-Minerals (MULTIVITAMIN GUMMIES ADULT PO) Take 1 tablet by mouth daily.   MYRBETRIQ 25 MG TB24 tablet TAKE 1 TABLET EVERY DAY   nicotine (NICODERM CQ) 21 mg/24hr patch Place 1 patch (21 mg total) onto the skin daily.   nystatin (MYCOSTATIN/NYSTOP) powder Apply 1 Application topically 2 (two) times daily.   Omega-3 Fatty Acids (FISH OIL PO) Take 1 capsule by mouth daily.   oxyCODONE-acetaminophen (PERCOCET) 10-325 MG tablet Take 1 tablet by mouth every 8 (eight) hours as needed for pain.   potassium chloride (KLOR-CON) 10 MEQ tablet Take 1 tablet (10 mEq total) by mouth 2 (two) times daily.   pregabalin (LYRICA) 75 MG capsule Take 1 capsule (75 mg total) by mouth daily.  RABEprazole (ACIPHEX) 20 MG tablet TAKE 1 TABLET TWICE DAILY   RESTASIS 0.05 % ophthalmic emulsion Place 2 drops into both eyes daily.   risperiDONE (RISPERDAL) 0.5 MG tablet Take 1 tablet (0.5 mg total) by mouth at bedtime.   rosuvastatin (CRESTOR) 5 MG tablet TAKE 1 TABLET AT BEDTIME   sertraline (ZOLOFT) 100 MG tablet Take 1 tablet (100 mg total) by mouth every morning.   spironolactone (ALDACTONE) 50 MG tablet TAKE 1 TABLET (50 MG TOTAL) BY MOUTH DAILY. DISCONTINUE SPIRONOLACTONE 25MG  (Patient taking differently: Take 50 mg by mouth daily.)   telmisartan (MICARDIS) 20 MG tablet Take 10 mg by mouth daily.   TOUJEO MAX SOLOSTAR 300 UNIT/ML Solostar Pen INJECT 20-26 UNITS INTO THE SKIN DAILY. (Patient taking differently: Inject 20-26 Units into the skin at bedtime.)   traZODone (DESYREL) 150 MG tablet Take  1 tablet (150 mg total) by mouth at bedtime.   umeclidinium-vilanterol (ANORO ELLIPTA) 62.5-25 MCG/ACT AEPB Inhale 1 puff into the lungs daily.   UNABLE TO FIND Med Name:  G5 Mattress   vitamin E 180 MG (400 UNITS) capsule Take 400 Units by mouth daily.   [DISCONTINUED] hydrOXYzine (ATARAX) 10 MG tablet Take one tablet by mouth once daily, as needed, for excessive itching (Patient taking differently: Take 10 mg by mouth daily as needed ("for excessive itching").)   hydrOXYzine (ATARAX) 10 MG tablet Take one tablet by mouth once daily, as needed, for excessive itching   No facility-administered encounter medications on file as of 08/09/2022.    Past Surgical History:  Procedure Laterality Date   ABDOMINAL HYSTERECTOMY  1978   BACK SURGERY  July 2012   BACTERIAL OVERGROWTH TEST N/A 05/05/2013   Procedure: BACTERIAL OVERGROWTH TEST;  Surgeon: Corbin Ade, MD;  [facility]: [facility];  Service: Endoscopy;  Laterality: N/A;  7:30   BIOPSY THYROID  2009   BRAIN SURGERY  11/2011   resection of meningioma   BREAST REDUCTION SURGERY  1994   CARDIAC CATHETERIZATION  05/10/2005   normal coronaries, normal LV systolic function and EF (Dr. Evlyn Courier)   CARPAL TUNNEL RELEASE Left 07/22/04   Dr. Romeo Apple   CATARACT EXTRACTION Bilateral    CHOLECYSTECTOMY  1984   COLONOSCOPY N/A 09/25/2012   [id number] diverticulosis.  colonic polyp-removed : tubular adenoma   CRANIOTOMY  11/23/2011   Procedure: CRANIOTOMY TUMOR EXCISION;  Surgeon: Hewitt Shorts, MD;  Location: [facility];  Service: Neurosurgery;  Laterality: N/A;  Craniotomy for tumor resection   ESOPHAGOGASTRODUODENOSCOPY  12/29/2010   Rourk-Retained food in the esophagus and stomach, small hiatal hernia, status post Maloney dilation of the esophagus   ESOPHAGOGASTRODUODENOSCOPY N/A 09/25/2012   [id number] atonic baggy esophagus status post Maloney dilation 56 F. Hiatal hernia   GIVENS CAPSULE STUDY N/A 01/15/2013   NORMAL.    IR GENERIC  HISTORICAL  03/17/2016   IR RADIOLOGIST EVAL & MGMT 03/17/2016 [facility]   LESION REMOVAL N/A 05/31/2015   Procedure: REMOVAL RIGHT AND LEFT LESIONS OF MANDIBLE;  Surgeon: Ocie Doyne, DDS;  Location: [facility];  Service: Oral Surgery;  Laterality: N/A;   MALONEY DILATION  12/29/2010   RMR;   NM MYOCAR PERF WALL MOTION  2006   "relavtiely normal" persantine, mild anterior thinning (breast attenuation artifact), no region of scar/ischemia   OVARIAN CYST REMOVAL     RECTOCELE REPAIR N/A 06/29/2015   Procedure: POSTERIOR REPAIR (RECTOCELE);  Surgeon: Tilda Burrow, MD;  Location: [facility];  Service: Gynecology;  Laterality: N/A;  REDUCTION MAMMAPLASTY Bilateral    SPINE SURGERY  09/29/2010   Dr. Shon Baton   surgical excision of 3 tumors from right thigh and right buttock  and left upper thigh  2010   TOOTH EXTRACTION Bilateral 12/14/2014   Procedure: REMOVAL OF BILATERAL MANDIBULAR EXOSTOSES;  Surgeon: Ocie Doyne, DDS;  Location: [facility];  Service: Oral Surgery;  Laterality: Bilateral;   TRANSTHORACIC ECHOCARDIOGRAM  2010   EF 60-65%, mild conc LVH, grade 1 diastolic dysfunction; mildly calcified MV annulus with mildly thickened leaflets, mildly calcified MR annulus    Review of Systems  Constitutional:  Negative for chills and fever.  Eyes:  Negative for blurred vision.  Respiratory:  Negative for shortness of breath.   Cardiovascular:  Negative for chest pain.  Gastrointestinal:  Negative for abdominal pain.  Genitourinary:  Negative for dysuria.  Musculoskeletal:  Negative for myalgias.  Skin:  Positive for itching and rash.  Neurological:  Negative for dizziness and headaches.      Objective    BP (!) 169/70 (BP Location: Right Arm, Patient Position: Sitting, Cuff Size: Normal)   Pulse 64   Ht 4\' 11"  (1.499 m)   Wt 167 lb (75.8 kg)   SpO2 92%   BMI 33.73 kg/m   Physical Exam Vitals reviewed.  Constitutional:      General: She is not in acute distress.    Appearance: Normal  appearance. She is not ill-appearing, toxic-appearing or diaphoretic.  HENT:     Head: Normocephalic.  Eyes:     General:        Right eye: No discharge.        Left eye: No discharge.     Conjunctiva/sclera: Conjunctivae normal.  Cardiovascular:     Rate and Rhythm: Normal rate.     Pulses: Normal pulses.     Heart sounds: Normal heart sounds.  Pulmonary:     Effort: Pulmonary effort is normal. No respiratory distress.     Breath sounds: Normal breath sounds.  Musculoskeletal:        General: Normal range of motion.     Cervical back: Normal range of motion.  Skin:    General: Skin is warm and dry.     Capillary Refill: Capillary refill takes less than 2 seconds.     Findings: Rash present. Rash is urticarial.     Comments: Medial aspect of scalp - Erythematous medium hard non moveable cyst, no pus or drainage noted.  Neurological:     General: No focal deficit present.     Mental Status: She is alert and oriented to person, place, and time.     Coordination: Coordination normal.     Gait: Gait normal.  Psychiatric:        Mood and Affect: Mood normal.        Behavior: Behavior normal.       Assessment & Plan:  Skin infection -     Doxycycline Hyclate; Take 1 tablet (100 mg total) by mouth 2 (two) times daily for 10 days.  Dispense: 20 tablet; Refill: 0  Scalp cyst Assessment & Plan: Doxycyline 100 mg x 10 days Hydroxyzine 10 mg for itching relief  Advise patient to use warm compress over the cyst  Referral sent to dermatology   Orders: -     Ambulatory referral to Dermatology  Assistance needed with transportation -     AMB Referral to [facility] ([facility] Patients)  Other orders -     hydrOXYzine HCl; Take  one tablet by mouth once daily, as needed, for excessive itching  Dispense: 14 tablet; Refill: 0    Return if symptoms worsen or fail to improve.   Cruzita Lederer Newman Nip, FNP

## 2022-08-09 NOTE — Progress Notes (Signed)
Glucose on CMP 67, results routed to Dr. Ranell Patrick

## 2022-08-09 NOTE — Progress Notes (Addendum)
Anesthesia Chart Review   Case: [id number] Date/Time: 08/18/22 1245   Procedure: REVERSE SHOULDER ARTHROPLASTY (Left: Shoulder) - choice with interscalene, general   Anesthesia type: Choice   Pre-op diagnosis: Pain of left shoulder joint   [facility]: [facility] / [facility]   Surgeons: Beverely Low, MD       DISCUSSION:72 y.o. former smoker with h/o sleep apnea on CPAP, DM II, COPD, HTN, stroke, CKD, left shoulder OA scheduled for above procedure 08/18/2022 with Dr. Beverely Low.   Normal cardiac cath 2007, low risk nuclear stress test 07/2017   Per cardiology preoperative evaluation 07/05/22, "Chart reviewed as part of pre-operative protocol coverage. Her RCRI is 0.9% and she is able to achieve > 4 METS activity without significant cardiac symptoms.    Based on ACC/AHA guidelines, [name] would be at acceptable risk for the planned procedure without further cardiovascular testing."  Pt last seen by pulmonology 06/20/22.  No changes at this visit, pt stable.   There is a diagnosis of malignant hyperpyrexia in the chart.  In reviewing history audit trail is was added during a visit with PCP on 04/10/2017.  Discuss with diagnosis in regards to UTI, hitting her head after a fall, and being referred to ED.  There is no mention of this diagnosis being associated with an anesthesia event.  She has had several anesthesia events here with no complications note.  Pt reports she has never been told she has MH or that any family members have MH.  She denies previous anesthesia complications.  I reached out to PCP to discuss the diagnosis, but she is on PAL until 08/21/2022.  Discussed with Dr. Richardson Landry.  VS: BP (!) 178/72   Pulse 67   Temp 37.2 C (Oral)   Resp 18   Ht 4\' 11"  (1.499 m)   Wt 75.8 kg   SpO2 98%   BMI 33.73 kg/m   PROVIDERS: Kerri Perches, MD is PCP   Primary Cardiologist: Armanda Magic, MD  LABS: Labs reviewed: Acceptable for surgery. (all labs ordered are listed, but only  abnormal results are displayed)  Labs Reviewed  COMPREHENSIVE METABOLIC PANEL - Abnormal; Notable for the following components:      Result Value   Glucose, Bld 67 (*)    All other components within normal limits  SURGICAL PCR SCREEN  HEMOGLOBIN A1C  GLUCOSE, CAPILLARY     IMAGES:   EKG:   CV: Myocardial Perfusion 06/27/2021   Findings are consistent with no prior ischemia and no prior myocardial infarction. The study is low risk.   No ST deviation was noted.   LV perfusion is normal. There is no evidence of ischemia. There is no evidence of infarction.   Left ventricular function is normal. End diastolic cavity size is normal. End systolic cavity size is normal.   Diaphragmatic attenuation no ischemia or infarct EF 70%   Echo 02/22/2015 Study Conclusions  - Left ventricle: The cavity size was normal. Wall thickness was    increased in a pattern of mild LVH. Systolic function was normal.    The estimated ejection fraction was in the range of 60% to 65%.    Indeterminate diastolic function. Wall motion was normal; there    were no regional wall motion abnormalities.  - Aortic valve: Valve area (VTI): 2.06 cm^2. Valve area (Vmax):    2.39 cm^2.  - Mitral valve: There was mild regurgitation.  - Left atrium: The atrium was mildly dilated.  - Atrial  septum: No defect or patent foramen ovale was identified.  - Pulmonary arteries: Systolic pressure was moderately increased.    PA peak pressure: 45 mm Hg (S).  - Technically adequate study.  Past Medical History:  Diagnosis Date   Allergy    Anemia    Anemia in chronic kidney disease (CKD) 08/10/2021   Anemia in chronic kidney disease (CKD) 10/12/2021   Anxiety    takes Ativan daily   Arthritis    Assistance needed for mobility    Bipolar disorder (HCC)    takes Risperdal nightly   Blood transfusion    Brain tumor (HCC)    Cancer (HCC)    In her gum   Carpal tunnel syndrome of right wrist 05/23/2011   Cervical disc  disorder with radiculopathy of cervical region 10/31/2012   Chronic back pain    Chronic idiopathic constipation    Chronic neck and back pain    Colon polyps    COPD (chronic obstructive pulmonary disease) with chronic bronchitis 09/16/2013   Office Spirometry 10/30/2013-submaximal effort based on appearance of loop and curve. Numbers would fit with severe restriction but her physiologic capability may be better than this. FVC 0.91/44%, and 10.74/45%, FEV1/FVC 0.81, FEF 25-75% 1.43/69%     Diabetes mellitus    Type II   Diverticulosis    TCS 9/08 by Dr. Lina Sar for diarrhea . Bx for micro scopic colitis negative.    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   Metabolic encephalopathy 08/03/2011   Migraines    chronic headaches   Mononeuritis lower limb    Narcolepsy    Osteoporosis    Pancreatitis 2006   due to Depakote with normal EUS    Paralysis (HCC)    Pneumonia    Schatzki's ring    non critical / EGD with ED 8/2011with RMR   Seizures (HCC)    takes Lamictal daily.Last seizure 3 yrs ago   Sleep apnea    on CPAP   Small bowel obstruction (HCC)    Stroke (HCC)    left sided weakness, speech changes   Tubular adenoma of colon     Past Surgical History:  Procedure Laterality Date   ABDOMINAL HYSTERECTOMY  1978   BACK SURGERY  July 2012   BACTERIAL OVERGROWTH TEST N/A 05/05/2013   Procedure: BACTERIAL OVERGROWTH TEST;  Surgeon: Corbin Ade, MD;  [facility];  Service:  Endoscopy;  Laterality: N/A;  7:30   BIOPSY THYROID  2009   BRAIN SURGERY  11/2011   resection of meningioma   BREAST REDUCTION SURGERY  1994   CARDIAC CATHETERIZATION  05/10/2005   normal coronaries, normal LV systolic function and EF (Dr. Evlyn Courier)   CARPAL TUNNEL RELEASE Left 07/22/04   Dr. Romeo Apple   CATARACT EXTRACTION Bilateral    CHOLECYSTECTOMY  1984   COLONOSCOPY N/A 09/25/2012   [id number] diverticulosis.  colonic polyp-removed : tubular adenoma   CRANIOTOMY  11/23/2011   Procedure: CRANIOTOMY TUMOR EXCISION;  Surgeon: Hewitt Shorts, MD;  [facility];  Service: Neurosurgery;  Laterality: N/A;  Craniotomy for tumor resection   ESOPHAGOGASTRODUODENOSCOPY  12/29/2010   Rourk-Retained food in the esophagus and stomach, small hiatal hernia, status post Maloney dilation of the esophagus   ESOPHAGOGASTRODUODENOSCOPY N/A 09/25/2012   [id number] atonic baggy esophagus status post Maloney dilation 56 F. Hiatal hernia   GIVENS CAPSULE STUDY N/A 01/15/2013   NORMAL.    IR GENERIC HISTORICAL  03/17/2016   IR RADIOLOGIST EVAL & MGMT 03/17/2016 [facility]   LESION REMOVAL N/A 05/31/2015   Procedure: REMOVAL RIGHT AND LEFT LESIONS OF MANDIBLE;  Surgeon: Ocie Doyne, DDS;  Location[facility];  Service: Oral Surgery;  Laterality: N/A;   MALONEY DILATION  12/29/2010   RMR;   NM MYOCAR PERF WALL MOTION  2006   "relavtiely normal" persantine, mild anterior thinning (breast attenuation artifact), no region of scar/ischemia   OVARIAN CYST REMOVAL     RECTOCELE REPAIR N/A 06/29/2015   Procedure: POSTERIOR REPAIR (RECTOCELE);  Surgeon: Tilda Burrow, MD;  Location: [facility];  Service: Gynecology;  Laterality: N/A;   REDUCTION MAMMAPLASTY Bilateral    SPINE SURGERY  09/29/2010   Dr. Shon Baton   surgical excision of 3 tumors from right thigh and right buttock  and left upper thigh  2010   TOOTH EXTRACTION Bilateral 12/14/2014   Procedure: REMOVAL OF BILATERAL MANDIBULAR EXOSTOSES;  Surgeon:  Ocie Doyne, DDS;  Location: [facility];  Service: Oral Surgery;  Laterality: Bilateral;   TRANSTHORACIC ECHOCARDIOGRAM  2010   EF 60-65%, mild conc LVH, grade 1 diastolic dysfunction; mildly calcified MV annulus with mildly thickened leaflets, mildly calcified MR annulus    MEDICATIONS:  ACCU-CHEK GUIDE test strip   Accu-Chek Softclix Lancets lancets   acetaminophen (TYLENOL) 325 MG tablet   albuterol (PROVENTIL) (2.5 MG/3ML) 0.083% nebulizer solution   albuterol (VENTOLIN HFA) 108 (90 Base) MCG/ACT inhaler   Alcohol Swabs (DROPSAFE ALCOHOL PREP) 70 % PADS   alendronate (FOSAMAX) 70 MG tablet   amLODipine (NORVASC) 10 MG tablet   ascorbic acid (VITAMIN C) 500 MG tablet   aspirin EC 81 MG tablet   benzonatate (TESSALON) 200 MG capsule   Blood Glucose Calibration (ACCU-CHEK GUIDE CONTROL) LIQD   blood glucose meter kit and supplies   buPROPion (WELLBUTRIN XL) 150 MG 24 hr tablet   Calcium Carb-Cholecalciferol (CALTRATE 600+D3 SOFT) 600-20 MG-MCG CHEW   cetirizine (ZYRTEC) 10 MG tablet   Cholecalciferol (D3 PO)   clobetasol (TEMOVATE) 0.05 % external solution   Continuous Blood Gluc Sensor (FREESTYLE LIBRE 14 DAY SENSOR) MISC   diazepam (VALIUM) 5 MG tablet   Docusate Sodium (DSS) 100 MG CAPS   DROPLET PEN NEEDLES 31G X 8 MM MISC   Elastic Bandages & Supports (ADJUSTABLE ARM SLING) MISC   ezetimibe (ZETIA) 10 MG tablet   hydrOXYzine (ATARAX) 10 MG tablet   insulin aspart (NOVOLOG FLEXPEN) 100 UNIT/ML FlexPen   ipratropium (ATROVENT) 0.03 % nasal spray   lactobacillus acidophilus (BACID) TABS tablet   lamoTRIgine (LAMICTAL) 100 MG tablet   levothyroxine (SYNTHROID) 50 MCG tablet   LORazepam (ATIVAN) 0.5 MG tablet   methocarbamol (ROBAXIN-750) 750 MG tablet   metoprolol tartrate (LOPRESSOR) 50 MG tablet   Misc. Devices (MATTRESS PAD) MISC   montelukast (SINGULAIR) 10 MG tablet   Multiple Vitamins-Minerals (MULTIVITAMIN GUMMIES ADULT PO)   MYRBETRIQ 25 MG TB24 tablet  nicotine  (NICODERM CQ) 21 mg/24hr patch   nystatin (MYCOSTATIN/NYSTOP) powder   Omega-3 Fatty Acids (FISH OIL PO)   oxyCODONE-acetaminophen (PERCOCET) 10-325 MG tablet   potassium chloride (KLOR-CON) 10 MEQ tablet   pregabalin (LYRICA) 75 MG capsule   RABEprazole (ACIPHEX) 20 MG tablet   RESTASIS 0.05 % ophthalmic emulsion   risperiDONE (RISPERDAL) 0.5 MG tablet   rosuvastatin (CRESTOR) 5 MG tablet   sertraline (ZOLOFT) 100 MG tablet   spironolactone (ALDACTONE) 50 MG tablet   telmisartan (MICARDIS) 20 MG tablet   TOUJEO MAX SOLOSTAR 300 UNIT/ML Solostar Pen   traZODone (DESYREL) 150 MG tablet   umeclidinium-vilanterol (ANORO ELLIPTA) 62.5-25 MCG/ACT AEPB   UNABLE TO FIND   vitamin E 180 MG (400 UNITS) capsule   No current facility-administered medications for this encounter.    Jodell Cipro Ward, PA-C [facility] [phone]

## 2022-08-10 ENCOUNTER — Telehealth: Payer: Self-pay | Admitting: Family Medicine

## 2022-08-10 DIAGNOSIS — L729 Follicular cyst of the skin and subcutaneous tissue, unspecified: Secondary | ICD-10-CM | POA: Insufficient documentation

## 2022-08-10 NOTE — Telephone Encounter (Signed)
Patient called in regard to Dermatology referral to [facility] . Office cannot see patient before 6/18. Patient wants a new referral placed to be seen as soon as possible. Patient wants call back with new referral info.

## 2022-08-10 NOTE — Assessment & Plan Note (Signed)
Doxycyline 100 mg x 10 days Hydroxyzine 10 mg for itching relief  Advise patient to use warm compress over the cyst  Referral sent to dermatology

## 2022-08-11 ENCOUNTER — Telehealth: Payer: Self-pay | Admitting: Family Medicine

## 2022-08-11 DIAGNOSIS — E1143 Type 2 diabetes mellitus with diabetic autonomic (poly)neuropathy: Secondary | ICD-10-CM

## 2022-08-11 DIAGNOSIS — I10 Essential (primary) hypertension: Secondary | ICD-10-CM

## 2022-08-11 NOTE — Telephone Encounter (Signed)
Pt needs call back in regard to Mattress has not heard anything in regard , has not received  mattress. States delivery should have been Thursday 5/30 and not delivered

## 2022-08-11 NOTE — Telephone Encounter (Signed)
Prescription Request  08/11/2022  LOV: 08/03/2022  What is the name of the medication or equipment? amLODipine (NORVASC) 10 MG tablet   methocarbamol (ROBAXIN-750) 750 MG tablet   Have you contacted your pharmacy to request a refill? No   Which pharmacy would you like this sent to?   [facility] - [place], [place] - [address] [place] [address] Phone: [phone] Fax: [phone]    Patient notified that their request is being sent to the clinical staff for review and that they should receive a response within 2 business days.   Please advise at Mobile [phone] (mobile)    Pt is out of meds needs sent in to [facility]

## 2022-08-11 NOTE — Telephone Encounter (Addendum)
Patient LVM wants a call back in regard to BP. Has been running in the 150-160's

## 2022-08-11 NOTE — Telephone Encounter (Signed)
Patient is calling back need blood pressure medicine refill today, blood pressure running high.      amLODipine (NORVASC) 10 MG tablet   methocarbamol (ROBAXIN-750) 750 MG tablet

## 2022-08-12 DIAGNOSIS — Z87891 Personal history of nicotine dependence: Secondary | ICD-10-CM | POA: Diagnosis not present

## 2022-08-12 DIAGNOSIS — M19012 Primary osteoarthritis, left shoulder: Secondary | ICD-10-CM | POA: Diagnosis not present

## 2022-08-12 DIAGNOSIS — J449 Chronic obstructive pulmonary disease, unspecified: Secondary | ICD-10-CM | POA: Diagnosis not present

## 2022-08-12 DIAGNOSIS — M25512 Pain in left shoulder: Secondary | ICD-10-CM | POA: Diagnosis not present

## 2022-08-12 DIAGNOSIS — G8918 Other acute postprocedural pain: Secondary | ICD-10-CM | POA: Diagnosis not present

## 2022-08-12 DIAGNOSIS — I10 Essential (primary) hypertension: Secondary | ICD-10-CM | POA: Diagnosis not present

## 2022-08-12 DIAGNOSIS — I110 Hypertensive heart disease with heart failure: Secondary | ICD-10-CM | POA: Diagnosis not present

## 2022-08-12 DIAGNOSIS — E119 Type 2 diabetes mellitus without complications: Secondary | ICD-10-CM | POA: Diagnosis not present

## 2022-08-12 DIAGNOSIS — Z96612 Presence of left artificial shoulder joint: Secondary | ICD-10-CM | POA: Diagnosis not present

## 2022-08-12 DIAGNOSIS — I509 Heart failure, unspecified: Secondary | ICD-10-CM | POA: Diagnosis not present

## 2022-08-12 DIAGNOSIS — M75102 Unspecified rotator cuff tear or rupture of left shoulder, not specified as traumatic: Secondary | ICD-10-CM | POA: Diagnosis not present

## 2022-08-14 ENCOUNTER — Telehealth: Payer: Self-pay | Admitting: *Deleted

## 2022-08-14 ENCOUNTER — Other Ambulatory Visit: Payer: Self-pay

## 2022-08-14 DIAGNOSIS — G8911 Acute pain due to trauma: Secondary | ICD-10-CM

## 2022-08-14 MED ORDER — METHOCARBAMOL 750 MG PO TABS
750.0000 mg | ORAL_TABLET | Freq: Three times a day (TID) | ORAL | 0 refills | Status: DC | PRN
Start: 2022-08-14 — End: 2022-10-11

## 2022-08-14 MED ORDER — AMLODIPINE BESYLATE 10 MG PO TABS: 10.0000 mg | ORAL_TABLET | Freq: Every day | ORAL | 0 refills | Status: DC

## 2022-08-14 NOTE — Telephone Encounter (Signed)
Refills sent

## 2022-08-14 NOTE — Anesthesia Preprocedure Evaluation (Addendum)
Anesthesia Evaluation  Patient identified by MRN, date of birth, ID band Patient awake    Reviewed: Allergy & Precautions, NPO status , Patient's Chart, lab work & pertinent test results  History of Anesthesia Complications (+) MALIGNANT HYPERTHERMIA and history of anesthetic complications  Airway Mallampati: I  TM Distance: >3 FB Neck ROM: Full    Dental  (+) Edentulous Upper, Edentulous Lower   Pulmonary sleep apnea and Continuous Positive Airway Pressure Ventilation , COPD,  COPD inhaler, Patient abstained from smoking., former smoker   breath sounds clear to auscultation       Cardiovascular hypertension, Pt. on medications and Pt. on home beta blockers +CHF   Rhythm:Regular Rate:Normal     Neuro/Psych Seizures -, Well Controlled,  PSYCHIATRIC DISORDERS Anxiety Depression Bipolar Disorder Schizophrenia  CVA    GI/Hepatic Neg liver ROS,GERD  Medicated and Controlled,,  Endo/Other  diabetes, Insulin DependentHypothyroidism    Renal/GU Renal disease     Musculoskeletal  (+) Arthritis , Osteoarthritis,  Fibromyalgia -  Abdominal   Peds  Hematology  (+) Blood dyscrasia (Hb 10.3), anemia   Anesthesia Other Findings   Reproductive/Obstetrics                             Anesthesia Physical Anesthesia Plan  ASA: 4  Anesthesia Plan: General and Regional   Post-op Pain Management: Regional block*   Induction: Intravenous  PONV Risk Score and Plan: 3 and Ondansetron and TIVA  Airway Management Planned: Oral ETT  Additional Equipment: None  Intra-op Plan:   Post-operative Plan: Extubation in OR  Informed Consent: I have reviewed the patients History and Physical, chart, labs and discussed the procedure including the risks, benefits and alternatives for the proposed anesthesia with the patient or authorized representative who has indicated his/her understanding and acceptance.      Dental advisory given  Plan Discussed with: CRNA and Anesthesiologist  Anesthesia Plan Comments: (See PAT note 08/08/2022, [name] Bumps Ward, PA-C  DISCUSSION:72 y.o. former smoker with h/o sleep apnea on CPAP, DM II, COPD, HTN, stroke, CKD, left shoulder OA scheduled for above procedure 08/18/2022 with Dr. Beverely Low.    Normal cardiac cath 2007, low risk nuclear stress test 07/2017    Per cardiology preoperative evaluation 07/05/22, "Chart reviewed as part of pre-operative protocol coverage. Her RCRI is 0.9% and she is able to achieve > 4 METS activity without significant cardiac symptoms.    Based on ACC/AHA guidelines, [name] would be at acceptable risk for the planned procedure without further cardiovascular testing."   Pt last seen by pulmonology 06/20/22.  No changes at this visit, pt stable.    There is a diagnosis of malignant hyperpyrexia in the chart.  In reviewing history audit trail is was added during a visit with PCP on 04/10/2017.  Discuss with diagnosis in regards to UTI, hitting her head after a fall, and being referred to ED.  There is no mention of this diagnosis being associated with an anesthesia event.  She has had several anesthesia events here with no complications note.  Pt reports she has never been told she has MH or that any family members have MH.  She denies previous anesthesia complications.  I reached out to PCP to discuss the diagnosis, but she is on PAL until 08/21/2022.  CV: Myocardial Perfusion 06/27/2021   Findings are consistent with no prior ischemia and no prior myocardial infarction. The study is low risk.  No ST deviation was noted.   LV perfusion is normal. There is no evidence of ischemia. There is no evidence of infarction.   Left ventricular function is normal. End diastolic cavity size is normal. End systolic cavity size is normal.   Diaphragmatic attenuation no ischemia or infarct EF 70%    Echo 02/22/2015 Study Conclusions  - Left  ventricle: The cavity size was normal. Wall thickness was    increased in a pattern of mild LVH. Systolic function was normal.    The estimated ejection fraction was in the range of 60% to 65%.    Indeterminate diastolic function. Wall motion was normal; there    were no regional wall motion abnormalities.  - Aortic valve: Valve area (VTI): 2.06 cm^2. Valve area (Vmax):    2.39 cm^2.  - Mitral valve: There was mild regurgitation.  - Left atrium: The atrium was mildly dilated.  - Atrial septum: No defect or patent foramen ovale was identified.  - Pulmonary arteries: Systolic pressure was moderately increased.    PA peak pressure: 45 mm Hg (S).  - Technically adequate study.  )        Anesthesia Quick Evaluation

## 2022-08-14 NOTE — Telephone Encounter (Signed)
   Telephone encounter was:  Successful.  08/14/2022 Name: [name] MRN: [id number] DOB: Dec 26, 1950  [name] is a 72 y.o. year old female who is a primary care patient of Lodema Hong Milus Mallick, MD . The community resource team was consulted for assistance with Transportation Needs  and Food Insecurity  Care guide performed the following interventions: Patient provided with information about care guide support team and interviewed to confirm resource needs. Patient has Medicaid for transportation and is ok for her transportation needs , would like to see if she will get [employer] extra services after surgery will reach out to case manaagement to see if they can address her needs , Also talked about the [facility] dc food program should be places after dc  Follow Up Plan:  No further follow up planned at this time. The patient has been provided with needed resources.  [name] Mao [facility] -[facility] , Population Health [phone] [address] , [place] [address] Email : [name]

## 2022-08-15 ENCOUNTER — Inpatient Hospital Stay: Payer: Medicare HMO | Attending: Hematology

## 2022-08-15 DIAGNOSIS — F419 Anxiety disorder, unspecified: Secondary | ICD-10-CM | POA: Diagnosis not present

## 2022-08-15 DIAGNOSIS — D631 Anemia in chronic kidney disease: Secondary | ICD-10-CM | POA: Diagnosis present

## 2022-08-15 DIAGNOSIS — Z85819 Personal history of malignant neoplasm of unspecified site of lip, oral cavity, and pharynx: Secondary | ICD-10-CM | POA: Diagnosis not present

## 2022-08-15 DIAGNOSIS — M19012 Primary osteoarthritis, left shoulder: Secondary | ICD-10-CM | POA: Diagnosis not present

## 2022-08-15 DIAGNOSIS — F17210 Nicotine dependence, cigarettes, uncomplicated: Secondary | ICD-10-CM | POA: Diagnosis present

## 2022-08-15 DIAGNOSIS — K219 Gastro-esophageal reflux disease without esophagitis: Secondary | ICD-10-CM | POA: Diagnosis present

## 2022-08-15 DIAGNOSIS — R41841 Cognitive communication deficit: Secondary | ICD-10-CM | POA: Diagnosis not present

## 2022-08-15 DIAGNOSIS — G4733 Obstructive sleep apnea (adult) (pediatric): Secondary | ICD-10-CM | POA: Diagnosis present

## 2022-08-15 DIAGNOSIS — I509 Heart failure, unspecified: Secondary | ICD-10-CM | POA: Diagnosis not present

## 2022-08-15 DIAGNOSIS — F319 Bipolar disorder, unspecified: Secondary | ICD-10-CM | POA: Diagnosis present

## 2022-08-15 DIAGNOSIS — N179 Acute kidney failure, unspecified: Secondary | ICD-10-CM | POA: Diagnosis not present

## 2022-08-15 DIAGNOSIS — F05 Delirium due to known physiological condition: Secondary | ICD-10-CM | POA: Diagnosis not present

## 2022-08-15 DIAGNOSIS — I959 Hypotension, unspecified: Secondary | ICD-10-CM | POA: Diagnosis not present

## 2022-08-15 DIAGNOSIS — J449 Chronic obstructive pulmonary disease, unspecified: Secondary | ICD-10-CM | POA: Diagnosis present

## 2022-08-15 DIAGNOSIS — M797 Fibromyalgia: Secondary | ICD-10-CM | POA: Diagnosis present

## 2022-08-15 DIAGNOSIS — I160 Hypertensive urgency: Secondary | ICD-10-CM | POA: Diagnosis present

## 2022-08-15 DIAGNOSIS — E1122 Type 2 diabetes mellitus with diabetic chronic kidney disease: Secondary | ICD-10-CM | POA: Diagnosis present

## 2022-08-15 DIAGNOSIS — M75101 Unspecified rotator cuff tear or rupture of right shoulder, not specified as traumatic: Secondary | ICD-10-CM | POA: Diagnosis present

## 2022-08-15 DIAGNOSIS — Z471 Aftercare following joint replacement surgery: Secondary | ICD-10-CM | POA: Diagnosis not present

## 2022-08-15 DIAGNOSIS — E039 Hypothyroidism, unspecified: Secondary | ICD-10-CM | POA: Diagnosis present

## 2022-08-15 DIAGNOSIS — D849 Immunodeficiency, unspecified: Secondary | ICD-10-CM | POA: Diagnosis present

## 2022-08-15 DIAGNOSIS — M75102 Unspecified rotator cuff tear or rupture of left shoulder, not specified as traumatic: Secondary | ICD-10-CM | POA: Diagnosis not present

## 2022-08-15 DIAGNOSIS — Z794 Long term (current) use of insulin: Secondary | ICD-10-CM | POA: Diagnosis not present

## 2022-08-15 DIAGNOSIS — R2689 Other abnormalities of gait and mobility: Secondary | ICD-10-CM | POA: Diagnosis not present

## 2022-08-15 DIAGNOSIS — I110 Hypertensive heart disease with heart failure: Secondary | ICD-10-CM | POA: Diagnosis not present

## 2022-08-15 DIAGNOSIS — N1831 Chronic kidney disease, stage 3a: Secondary | ICD-10-CM | POA: Diagnosis present

## 2022-08-15 DIAGNOSIS — M6281 Muscle weakness (generalized): Secondary | ICD-10-CM | POA: Diagnosis not present

## 2022-08-15 DIAGNOSIS — D509 Iron deficiency anemia, unspecified: Secondary | ICD-10-CM

## 2022-08-15 DIAGNOSIS — I10 Essential (primary) hypertension: Secondary | ICD-10-CM | POA: Diagnosis not present

## 2022-08-15 DIAGNOSIS — Z87891 Personal history of nicotine dependence: Secondary | ICD-10-CM | POA: Diagnosis not present

## 2022-08-15 DIAGNOSIS — Z96612 Presence of left artificial shoulder joint: Secondary | ICD-10-CM | POA: Diagnosis not present

## 2022-08-15 DIAGNOSIS — F32A Depression, unspecified: Secondary | ICD-10-CM | POA: Diagnosis not present

## 2022-08-15 DIAGNOSIS — R410 Disorientation, unspecified: Secondary | ICD-10-CM | POA: Diagnosis not present

## 2022-08-15 DIAGNOSIS — E785 Hyperlipidemia, unspecified: Secondary | ICD-10-CM | POA: Diagnosis present

## 2022-08-15 DIAGNOSIS — I129 Hypertensive chronic kidney disease with stage 1 through stage 4 chronic kidney disease, or unspecified chronic kidney disease: Secondary | ICD-10-CM | POA: Diagnosis present

## 2022-08-15 DIAGNOSIS — R1311 Dysphagia, oral phase: Secondary | ICD-10-CM | POA: Diagnosis not present

## 2022-08-15 DIAGNOSIS — Z4789 Encounter for other orthopedic aftercare: Secondary | ICD-10-CM | POA: Diagnosis not present

## 2022-08-15 DIAGNOSIS — Z79890 Hormone replacement therapy (postmenopausal): Secondary | ICD-10-CM | POA: Diagnosis not present

## 2022-08-15 DIAGNOSIS — Z881 Allergy status to other antibiotic agents status: Secondary | ICD-10-CM | POA: Diagnosis not present

## 2022-08-15 DIAGNOSIS — E1165 Type 2 diabetes mellitus with hyperglycemia: Secondary | ICD-10-CM | POA: Diagnosis present

## 2022-08-15 DIAGNOSIS — M25512 Pain in left shoulder: Secondary | ICD-10-CM | POA: Diagnosis not present

## 2022-08-15 DIAGNOSIS — Z743 Need for continuous supervision: Secondary | ICD-10-CM | POA: Diagnosis not present

## 2022-08-15 DIAGNOSIS — E119 Type 2 diabetes mellitus without complications: Secondary | ICD-10-CM | POA: Diagnosis not present

## 2022-08-15 DIAGNOSIS — G8918 Other acute postprocedural pain: Secondary | ICD-10-CM | POA: Diagnosis not present

## 2022-08-15 DIAGNOSIS — Z79899 Other long term (current) drug therapy: Secondary | ICD-10-CM | POA: Diagnosis not present

## 2022-08-15 LAB — CBC
HCT: 36.4 % (ref 36.0–46.0)
Hemoglobin: 10.5 g/dL — ABNORMAL LOW (ref 12.0–15.0)
MCH: 23.2 pg — ABNORMAL LOW (ref 26.0–34.0)
MCHC: 28.8 g/dL — ABNORMAL LOW (ref 30.0–36.0)
MCV: 80.5 fL (ref 80.0–100.0)
Platelets: 233 10*3/uL (ref 150–400)
RBC: 4.52 MIL/uL (ref 3.87–5.11)
RDW: 14.6 % (ref 11.5–15.5)
WBC: 6.2 10*3/uL (ref 4.0–10.5)
nRBC: 0 % (ref 0.0–0.2)

## 2022-08-15 LAB — FERRITIN: Ferritin: 256 ng/mL (ref 11–307)

## 2022-08-15 LAB — IRON AND TIBC
Iron: 57 ug/dL (ref 28–170)
Saturation Ratios: 19 % (ref 10.4–31.8)
TIBC: 307 ug/dL (ref 250–450)
UIBC: 250 ug/dL

## 2022-08-15 NOTE — Telephone Encounter (Signed)
I talked with patient and 2 weeks is good for a Derm appt.  Some offices are scheduling in Dec and Jan.  I told her it would be best the keep the 6/18 appt and call the office is she needs something for itching.

## 2022-08-16 NOTE — Progress Notes (Signed)
Confirmed surgery arrival time for 7 AM with medical transportation and patient both verbalized understanding.

## 2022-08-17 ENCOUNTER — Other Ambulatory Visit: Payer: Self-pay | Admitting: Family Medicine

## 2022-08-17 DIAGNOSIS — Z79899 Other long term (current) drug therapy: Secondary | ICD-10-CM

## 2022-08-18 ENCOUNTER — Ambulatory Visit (HOSPITAL_COMMUNITY): Payer: Medicare HMO | Admitting: Physician Assistant

## 2022-08-18 ENCOUNTER — Encounter (HOSPITAL_COMMUNITY)
Admission: RE | Disposition: A | Discharge status: Skilled Nursing Facility | Payer: Self-pay | Source: Home / Self Care | Attending: Orthopedic Surgery

## 2022-08-18 ENCOUNTER — Inpatient Hospital Stay (HOSPITAL_COMMUNITY)
Admission: RE | Admit: 2022-08-18 | Discharge: 2022-08-25 | DRG: 483 | Disposition: A | Discharge status: Skilled Nursing Facility | Payer: Medicare HMO | Attending: Orthopedic Surgery | Admitting: Orthopedic Surgery

## 2022-08-18 ENCOUNTER — Ambulatory Visit (HOSPITAL_BASED_OUTPATIENT_CLINIC_OR_DEPARTMENT_OTHER): Payer: Medicare HMO | Admitting: Anesthesiology

## 2022-08-18 ENCOUNTER — Other Ambulatory Visit: Payer: Self-pay

## 2022-08-18 ENCOUNTER — Encounter (HOSPITAL_COMMUNITY): Payer: Self-pay | Admitting: Orthopedic Surgery

## 2022-08-18 ENCOUNTER — Inpatient Hospital Stay (HOSPITAL_COMMUNITY): Payer: Medicare HMO

## 2022-08-18 DIAGNOSIS — Z96612 Presence of left artificial shoulder joint: Principal | ICD-10-CM

## 2022-08-18 DIAGNOSIS — M25512 Pain in left shoulder: Secondary | ICD-10-CM | POA: Diagnosis not present

## 2022-08-18 DIAGNOSIS — Z841 Family history of disorders of kidney and ureter: Secondary | ICD-10-CM

## 2022-08-18 DIAGNOSIS — I110 Hypertensive heart disease with heart failure: Secondary | ICD-10-CM

## 2022-08-18 DIAGNOSIS — E039 Hypothyroidism, unspecified: Secondary | ICD-10-CM | POA: Diagnosis present

## 2022-08-18 DIAGNOSIS — N1831 Chronic kidney disease, stage 3a: Secondary | ICD-10-CM | POA: Diagnosis present

## 2022-08-18 DIAGNOSIS — M75102 Unspecified rotator cuff tear or rupture of left shoulder, not specified as traumatic: Secondary | ICD-10-CM

## 2022-08-18 DIAGNOSIS — D631 Anemia in chronic kidney disease: Secondary | ICD-10-CM | POA: Diagnosis present

## 2022-08-18 DIAGNOSIS — Z79890 Hormone replacement therapy (postmenopausal): Secondary | ICD-10-CM | POA: Diagnosis not present

## 2022-08-18 DIAGNOSIS — Z85819 Personal history of malignant neoplasm of unspecified site of lip, oral cavity, and pharynx: Secondary | ICD-10-CM

## 2022-08-18 DIAGNOSIS — I129 Hypertensive chronic kidney disease with stage 1 through stage 4 chronic kidney disease, or unspecified chronic kidney disease: Secondary | ICD-10-CM | POA: Diagnosis present

## 2022-08-18 DIAGNOSIS — H409 Unspecified glaucoma: Secondary | ICD-10-CM | POA: Diagnosis present

## 2022-08-18 DIAGNOSIS — Z800 Family history of malignant neoplasm of digestive organs: Secondary | ICD-10-CM

## 2022-08-18 DIAGNOSIS — I10 Essential (primary) hypertension: Secondary | ICD-10-CM | POA: Diagnosis not present

## 2022-08-18 DIAGNOSIS — Z888 Allergy status to other drugs, medicaments and biological substances status: Secondary | ICD-10-CM

## 2022-08-18 DIAGNOSIS — E119 Type 2 diabetes mellitus without complications: Secondary | ICD-10-CM | POA: Diagnosis not present

## 2022-08-18 DIAGNOSIS — E1165 Type 2 diabetes mellitus with hyperglycemia: Secondary | ICD-10-CM | POA: Diagnosis present

## 2022-08-18 DIAGNOSIS — F17210 Nicotine dependence, cigarettes, uncomplicated: Secondary | ICD-10-CM | POA: Diagnosis present

## 2022-08-18 DIAGNOSIS — J449 Chronic obstructive pulmonary disease, unspecified: Secondary | ICD-10-CM

## 2022-08-18 DIAGNOSIS — K219 Gastro-esophageal reflux disease without esophagitis: Secondary | ICD-10-CM | POA: Diagnosis present

## 2022-08-18 DIAGNOSIS — E785 Hyperlipidemia, unspecified: Secondary | ICD-10-CM | POA: Diagnosis present

## 2022-08-18 DIAGNOSIS — Z811 Family history of alcohol abuse and dependence: Secondary | ICD-10-CM

## 2022-08-18 DIAGNOSIS — Z79899 Other long term (current) drug therapy: Secondary | ICD-10-CM

## 2022-08-18 DIAGNOSIS — M75101 Unspecified rotator cuff tear or rupture of right shoulder, not specified as traumatic: Secondary | ICD-10-CM | POA: Diagnosis present

## 2022-08-18 DIAGNOSIS — Z881 Allergy status to other antibiotic agents status: Secondary | ICD-10-CM | POA: Diagnosis not present

## 2022-08-18 DIAGNOSIS — M797 Fibromyalgia: Secondary | ICD-10-CM | POA: Diagnosis present

## 2022-08-18 DIAGNOSIS — Z87891 Personal history of nicotine dependence: Secondary | ICD-10-CM

## 2022-08-18 DIAGNOSIS — F319 Bipolar disorder, unspecified: Secondary | ICD-10-CM | POA: Diagnosis present

## 2022-08-18 DIAGNOSIS — M810 Age-related osteoporosis without current pathological fracture: Secondary | ICD-10-CM | POA: Diagnosis present

## 2022-08-18 DIAGNOSIS — Z91011 Allergy to milk products: Secondary | ICD-10-CM

## 2022-08-18 DIAGNOSIS — Z7982 Long term (current) use of aspirin: Secondary | ICD-10-CM

## 2022-08-18 DIAGNOSIS — M19012 Primary osteoarthritis, left shoulder: Secondary | ICD-10-CM

## 2022-08-18 DIAGNOSIS — Z471 Aftercare following joint replacement surgery: Secondary | ICD-10-CM | POA: Diagnosis not present

## 2022-08-18 DIAGNOSIS — D849 Immunodeficiency, unspecified: Secondary | ICD-10-CM | POA: Diagnosis present

## 2022-08-18 DIAGNOSIS — N179 Acute kidney failure, unspecified: Secondary | ICD-10-CM | POA: Diagnosis not present

## 2022-08-18 DIAGNOSIS — G4733 Obstructive sleep apnea (adult) (pediatric): Secondary | ICD-10-CM | POA: Diagnosis present

## 2022-08-18 DIAGNOSIS — G8918 Other acute postprocedural pain: Secondary | ICD-10-CM | POA: Diagnosis not present

## 2022-08-18 DIAGNOSIS — Z823 Family history of stroke: Secondary | ICD-10-CM

## 2022-08-18 DIAGNOSIS — I509 Heart failure, unspecified: Secondary | ICD-10-CM

## 2022-08-18 DIAGNOSIS — F05 Delirium due to known physiological condition: Secondary | ICD-10-CM | POA: Diagnosis not present

## 2022-08-18 DIAGNOSIS — Z833 Family history of diabetes mellitus: Secondary | ICD-10-CM

## 2022-08-18 DIAGNOSIS — I160 Hypertensive urgency: Secondary | ICD-10-CM | POA: Diagnosis present

## 2022-08-18 DIAGNOSIS — Z794 Long term (current) use of insulin: Secondary | ICD-10-CM

## 2022-08-18 DIAGNOSIS — Z8249 Family history of ischemic heart disease and other diseases of the circulatory system: Secondary | ICD-10-CM

## 2022-08-18 DIAGNOSIS — E1122 Type 2 diabetes mellitus with diabetic chronic kidney disease: Secondary | ICD-10-CM | POA: Diagnosis present

## 2022-08-18 DIAGNOSIS — Z7983 Long term (current) use of bisphosphonates: Secondary | ICD-10-CM

## 2022-08-18 DIAGNOSIS — Z86011 Personal history of benign neoplasm of the brain: Secondary | ICD-10-CM

## 2022-08-18 DIAGNOSIS — Z880 Allergy status to penicillin: Secondary | ICD-10-CM

## 2022-08-18 DIAGNOSIS — Z8673 Personal history of transient ischemic attack (TIA), and cerebral infarction without residual deficits: Secondary | ICD-10-CM

## 2022-08-18 DIAGNOSIS — Z7951 Long term (current) use of inhaled steroids: Secondary | ICD-10-CM

## 2022-08-18 DIAGNOSIS — Z803 Family history of malignant neoplasm of breast: Secondary | ICD-10-CM

## 2022-08-18 HISTORY — PX: REVERSE SHOULDER ARTHROPLASTY: SHX5054

## 2022-08-18 LAB — BASIC METABOLIC PANEL
Anion gap: 10 (ref 5–15)
BUN: 14 mg/dL (ref 8–23)
CO2: 22 mmol/L (ref 22–32)
Calcium: 8.6 mg/dL — ABNORMAL LOW (ref 8.9–10.3)
Chloride: 106 mmol/L (ref 98–111)
Creatinine, Ser: 0.8 mg/dL (ref 0.44–1.00)
GFR, Estimated: >60 mL/min (ref >60)
Glucose, Bld: 233 mg/dL — ABNORMAL HIGH (ref 70–99)
Potassium: 4 mmol/L (ref 3.5–5.1)
Sodium: 138 mmol/L (ref 135–145)

## 2022-08-18 LAB — CBC WITH DIFFERENTIAL/PLATELET
Abs Immature Granulocytes: 0.06 10*3/uL (ref 0.00–0.07)
Basophils Absolute: 0 10*3/uL (ref 0.0–0.1)
Basophils Relative: 1 %
Eosinophils Absolute: 0.1 10*3/uL (ref 0.0–0.5)
Eosinophils Relative: 1 %
HCT: 38.7 % (ref 36.0–46.0)
Hemoglobin: 10.9 g/dL — ABNORMAL LOW (ref 12.0–15.0)
Immature Granulocytes: 1 %
Lymphocytes Relative: 19 %
Lymphs Abs: 1.5 10*3/uL (ref 0.7–4.0)
MCH: 23 pg — ABNORMAL LOW (ref 26.0–34.0)
MCHC: 28.2 g/dL — ABNORMAL LOW (ref 30.0–36.0)
MCV: 81.6 fL (ref 80.0–100.0)
Monocytes Absolute: 0.4 10*3/uL (ref 0.1–1.0)
Monocytes Relative: 5 %
Neutro Abs: 5.8 10*3/uL (ref 1.7–7.7)
Neutrophils Relative %: 73 %
Platelets: 230 10*3/uL (ref 150–400)
RBC: 4.74 MIL/uL (ref 3.87–5.11)
RDW: 14.3 % (ref 11.5–15.5)
WBC: 7.9 10*3/uL (ref 4.0–10.5)
nRBC: 0.3 % — ABNORMAL HIGH (ref 0.0–0.2)

## 2022-08-18 LAB — MRSA NEXT GEN BY PCR, NASAL: MRSA by PCR Next Gen: NOT DETECTED

## 2022-08-18 LAB — GLUCOSE, CAPILLARY
Glucose-Capillary: 115 mg/dL — ABNORMAL HIGH (ref 70–99)
Glucose-Capillary: 222 mg/dL — ABNORMAL HIGH (ref 70–99)
Glucose-Capillary: 196 mg/dL — ABNORMAL HIGH (ref 70–99)
Glucose-Capillary: 252 mg/dL — ABNORMAL HIGH (ref 70–99)

## 2022-08-18 SURGERY — ARTHROPLASTY, SHOULDER, TOTAL, REVERSE
Anesthesia: Regional | Site: Shoulder | Laterality: Left

## 2022-08-18 MED ORDER — METOCLOPRAMIDE HCL 5 MG PO TABS: 5.0000 mg | ORAL_TABLET | Freq: Three times a day (TID) | ORAL | Status: DC | PRN

## 2022-08-18 MED ORDER — TRAZODONE HCL 50 MG PO TABS
150.0000 mg | ORAL_TABLET | Freq: Every day | ORAL | Status: DC
  Administered 2022-08-18 – 2022-08-20 (×3): 150 mg via ORAL
  Filled 2022-08-18: qty 1
  Filled 2022-08-18: qty 3
  Filled 2022-08-18: qty 1

## 2022-08-18 MED ORDER — BUPIVACAINE HCL (PF) 0.25 % IJ SOLN
INTRAMUSCULAR | Status: AC
  Filled 2022-08-18: qty 30

## 2022-08-18 MED ORDER — ROCURONIUM BROMIDE 100 MG/10ML IV SOLN
INTRAVENOUS | Status: DC | PRN
  Administered 2022-08-18: 60 mg via INTRAVENOUS

## 2022-08-18 MED ORDER — BISACODYL 10 MG RE SUPP: 10.0000 mg | Freq: Every day | RECTAL | Status: DC | PRN

## 2022-08-18 MED ORDER — LIDOCAINE HCL (PF) 2 % IJ SOLN
INTRAMUSCULAR | Status: AC
  Filled 2022-08-18: qty 5

## 2022-08-18 MED ORDER — MENTHOL 3 MG MT LOZG: 1.0000 | LOZENGE | OROMUCOSAL | Status: DC | PRN

## 2022-08-18 MED ORDER — PROPOFOL 10 MG/ML IV BOLUS
INTRAVENOUS | Status: AC
  Filled 2022-08-18: qty 20

## 2022-08-18 MED ORDER — ALBUMIN HUMAN 5 % IV SOLN
INTRAVENOUS | Status: AC
  Filled 2022-08-18: qty 250

## 2022-08-18 MED ORDER — TRANEXAMIC ACID-NACL 1000-0.7 MG/100ML-% IV SOLN
1000.0000 mg | Freq: Once | INTRAVENOUS | Status: AC
  Administered 2022-08-18: 1000 mg via INTRAVENOUS
  Filled 2022-08-18: qty 100

## 2022-08-18 MED ORDER — RISPERIDONE 0.5 MG PO TABS
0.5000 mg | ORAL_TABLET | Freq: Every day | ORAL | Status: DC
  Administered 2022-08-18 – 2022-08-24 (×7): 0.5 mg via ORAL
  Filled 2022-08-18 (×8): qty 1

## 2022-08-18 MED ORDER — DOCUSATE SODIUM 100 MG PO CAPS
100.0000 mg | ORAL_CAPSULE | Freq: Two times a day (BID) | ORAL | Status: DC
  Administered 2022-08-18 – 2022-08-25 (×13): 100 mg via ORAL
  Filled 2022-08-18 (×13): qty 1

## 2022-08-18 MED ORDER — ADULT MULTIVITAMIN W/MINERALS CH
1.0000 | ORAL_TABLET | Freq: Every day | ORAL | Status: DC
  Administered 2022-08-19 – 2022-08-25 (×7): 1 via ORAL
  Filled 2022-08-18 (×7): qty 1

## 2022-08-18 MED ORDER — PHENYLEPHRINE HCL (PRESSORS) 10 MG/ML IV SOLN
INTRAVENOUS | Status: AC
  Filled 2022-08-18: qty 1

## 2022-08-18 MED ORDER — MIDAZOLAM HCL 2 MG/2ML IJ SOLN: 1.0000 mg | INTRAMUSCULAR | Status: DC

## 2022-08-18 MED ORDER — EZETIMIBE 10 MG PO TABS
10.0000 mg | ORAL_TABLET | Freq: Every day | ORAL | Status: DC
  Administered 2022-08-18 – 2022-08-25 (×8): 10 mg via ORAL
  Filled 2022-08-18 (×8): qty 1

## 2022-08-18 MED ORDER — ROCURONIUM BROMIDE 10 MG/ML (PF) SYRINGE
PREFILLED_SYRINGE | INTRAVENOUS | Status: AC
  Filled 2022-08-18: qty 10

## 2022-08-18 MED ORDER — MIRABEGRON ER 25 MG PO TB24
25.0000 mg | ORAL_TABLET | Freq: Every day | ORAL | Status: DC
  Administered 2022-08-19 – 2022-08-25 (×7): 25 mg via ORAL
  Filled 2022-08-18 (×7): qty 1

## 2022-08-18 MED ORDER — INSULIN ASPART 100 UNIT/ML IJ SOLN
0.0000 [IU] | Freq: Three times a day (TID) | INTRAMUSCULAR | Status: DC
  Administered 2022-08-18 – 2022-08-19 (×2): 3 [IU] via SUBCUTANEOUS
  Administered 2022-08-19 – 2022-08-23 (×6): 2 [IU] via SUBCUTANEOUS
  Administered 2022-08-25: 3 [IU] via SUBCUTANEOUS

## 2022-08-18 MED ORDER — LIDOCAINE HCL (CARDIAC) PF 100 MG/5ML IV SOSY: PREFILLED_SYRINGE | INTRAVENOUS | Status: DC | PRN

## 2022-08-18 MED ORDER — FENTANYL CITRATE (PF) 100 MCG/2ML IJ SOLN
INTRAMUSCULAR | Status: DC | PRN
  Administered 2022-08-18: 50 ug via INTRAVENOUS

## 2022-08-18 MED ORDER — ALBUTEROL SULFATE (2.5 MG/3ML) 0.083% IN NEBU: 2.5000 mg | INHALATION_SOLUTION | Freq: Four times a day (QID) | RESPIRATORY_TRACT | Status: DC | PRN

## 2022-08-18 MED ORDER — METHOCARBAMOL 500 MG PO TABS
750.0000 mg | ORAL_TABLET | Freq: Three times a day (TID) | ORAL | Status: DC | PRN
  Administered 2022-08-18 – 2022-08-20 (×4): 750 mg via ORAL
  Filled 2022-08-18 (×4): qty 2

## 2022-08-18 MED ORDER — NICOTINE 7 MG/24HR TD PT24: 21.0000 mg | MEDICATED_PATCH | Freq: Every day | TRANSDERMAL | Status: DC | PRN

## 2022-08-18 MED ORDER — ESMOLOL HCL 100 MG/10ML IV SOLN
INTRAVENOUS | Status: AC
  Filled 2022-08-18: qty 10

## 2022-08-18 MED ORDER — UMECLIDINIUM-VILANTEROL 62.5-25 MCG/ACT IN AEPB: 1.0000 | INHALATION_SPRAY | Freq: Every day | RESPIRATORY_TRACT | Status: DC

## 2022-08-18 MED ORDER — LEVOTHYROXINE SODIUM 25 MCG PO TABS
25.0000 ug | ORAL_TABLET | ORAL | Status: DC
  Administered 2022-08-20: 25 ug via ORAL
  Filled 2022-08-18: qty 1

## 2022-08-18 MED ORDER — DOXYCYCLINE HYCLATE 100 MG PO TABS
100.0000 mg | ORAL_TABLET | Freq: Two times a day (BID) | ORAL | Status: DC
  Administered 2022-08-18 – 2022-08-25 (×14): 100 mg via ORAL
  Filled 2022-08-18 (×15): qty 1

## 2022-08-18 MED ORDER — INSULIN ASPART 100 UNIT/ML IJ SOLN
0.0000 [IU] | Freq: Every day | INTRAMUSCULAR | Status: DC
  Administered 2022-08-18: 3 [IU] via SUBCUTANEOUS

## 2022-08-18 MED ORDER — BUPIVACAINE LIPOSOME 1.3 % IJ SUSP
INTRAMUSCULAR | Status: DC | PRN
  Administered 2022-08-18: 10 mL via PERINEURAL

## 2022-08-18 MED ORDER — OXYCODONE HCL 5 MG PO TABS
5.0000 mg | ORAL_TABLET | ORAL | Status: DC | PRN
  Administered 2022-08-20 – 2022-08-22 (×3): 10 mg via ORAL
  Administered 2022-08-23: 5 mg via ORAL
  Administered 2022-08-23: 10 mg via ORAL
  Administered 2022-08-23: 5 mg via ORAL
  Administered 2022-08-24 – 2022-08-25 (×4): 10 mg via ORAL
  Filled 2022-08-18 (×5): qty 2
  Filled 2022-08-18: qty 1
  Filled 2022-08-18: qty 2
  Filled 2022-08-18: qty 1
  Filled 2022-08-18 (×2): qty 2

## 2022-08-18 MED ORDER — MULTIVITAMIN GUMMIES ADULT PO CHEW: CHEWABLE_TABLET | Freq: Every day | ORAL | Status: DC

## 2022-08-18 MED ORDER — PROPOFOL 10 MG/ML IV BOLUS
INTRAVENOUS | Status: DC | PRN
  Administered 2022-08-18: 140 mg via INTRAVENOUS

## 2022-08-18 MED ORDER — ALENDRONATE SODIUM 70 MG PO TABS: 70.0000 mg | ORAL_TABLET | ORAL | Status: DC

## 2022-08-18 MED ORDER — METOPROLOL TARTRATE 50 MG PO TABS
50.0000 mg | ORAL_TABLET | Freq: Two times a day (BID) | ORAL | Status: DC
  Administered 2022-08-18 – 2022-08-25 (×14): 50 mg via ORAL
  Filled 2022-08-18 (×2): qty 1
  Filled 2022-08-18: qty 2
  Filled 2022-08-18 (×8): qty 1
  Filled 2022-08-18: qty 2
  Filled 2022-08-18 (×2): qty 1

## 2022-08-18 MED ORDER — OXYCODONE HCL 5 MG PO TABS: 5.0000 mg | ORAL_TABLET | Freq: Once | ORAL | Status: DC | PRN

## 2022-08-18 MED ORDER — HYDROXYZINE HCL 10 MG PO TABS
10.0000 mg | ORAL_TABLET | Freq: Every day | ORAL | Status: DC | PRN
  Administered 2022-08-22: 10 mg via ORAL
  Filled 2022-08-18 (×2): qty 1

## 2022-08-18 MED ORDER — VANCOMYCIN HCL IN DEXTROSE 1-5 GM/200ML-% IV SOLN
1000.0000 mg | INTRAVENOUS | Status: AC
  Administered 2022-08-18: 1000 mg via INTRAVENOUS
  Filled 2022-08-18: qty 200

## 2022-08-18 MED ORDER — DIAZEPAM 5 MG PO TABS: 5.0000 mg | ORAL_TABLET | Freq: Once | ORAL | Status: DC | PRN

## 2022-08-18 MED ORDER — SERTRALINE HCL 100 MG PO TABS
100.0000 mg | ORAL_TABLET | Freq: Every day | ORAL | Status: DC
  Administered 2022-08-19 – 2022-08-25 (×6): 100 mg via ORAL
  Filled 2022-08-18 (×6): qty 1

## 2022-08-18 MED ORDER — INSULIN GLARGINE-YFGN 100 UNIT/ML ~~LOC~~ SOLN
20.0000 [IU] | Freq: Every day | SUBCUTANEOUS | Status: DC
  Administered 2022-08-18 – 2022-08-24 (×7): 20 [IU] via SUBCUTANEOUS
  Filled 2022-08-18 (×8): qty 0.2

## 2022-08-18 MED ORDER — LABETALOL HCL 5 MG/ML IV SOLN
INTRAVENOUS | Status: AC
  Filled 2022-08-18: qty 4

## 2022-08-18 MED ORDER — DOCUSATE SODIUM 100 MG PO CAPS: 100.0000 mg | ORAL_CAPSULE | Freq: Two times a day (BID) | ORAL | Status: DC

## 2022-08-18 MED ORDER — IPRATROPIUM BROMIDE 0.03 % NA SOLN: 2.0000 | Freq: Two times a day (BID) | NASAL | Status: DC

## 2022-08-18 MED ORDER — PHENYLEPHRINE HCL (PRESSORS) 10 MG/ML IV SOLN
INTRAVENOUS | Status: DC | PRN
  Administered 2022-08-18: 160 ug via INTRAVENOUS
  Administered 2022-08-18: 100 ug via INTRAVENOUS

## 2022-08-18 MED ORDER — SODIUM CHLORIDE 0.9 % IV SOLN: INTRAVENOUS | Status: DC

## 2022-08-18 MED ORDER — HYDRALAZINE HCL 20 MG/ML IJ SOLN
10.0000 mg | Freq: Once | INTRAMUSCULAR | Status: AC
  Administered 2022-08-18: 10 mg via INTRAVENOUS
  Filled 2022-08-18: qty 1

## 2022-08-18 MED ORDER — PHENYLEPHRINE 80 MCG/ML (10ML) SYRINGE FOR IV PUSH (FOR BLOOD PRESSURE SUPPORT)
PREFILLED_SYRINGE | INTRAVENOUS | Status: AC
  Filled 2022-08-18: qty 10

## 2022-08-18 MED ORDER — CHLORHEXIDINE GLUCONATE CLOTH 2 % EX PADS: 6.0000 | MEDICATED_PAD | Freq: Every day | CUTANEOUS | Status: DC

## 2022-08-18 MED ORDER — OXYCODONE HCL 5 MG PO TABS
10.0000 mg | ORAL_TABLET | ORAL | Status: DC | PRN
  Administered 2022-08-18 – 2022-08-20 (×4): 10 mg via ORAL
  Filled 2022-08-18 (×4): qty 2

## 2022-08-18 MED ORDER — ONDANSETRON HCL 4 MG/2ML IJ SOLN
INTRAMUSCULAR | Status: DC | PRN
  Administered 2022-08-18: 4 mg via INTRAVENOUS

## 2022-08-18 MED ORDER — ESMOLOL HCL 100 MG/10ML IV SOLN
INTRAVENOUS | Status: DC | PRN
  Administered 2022-08-18 (×2): 20 mg via INTRAVENOUS

## 2022-08-18 MED ORDER — LEVOTHYROXINE SODIUM 50 MCG PO TABS
50.0000 ug | ORAL_TABLET | ORAL | Status: DC
  Administered 2022-08-19 – 2022-08-25 (×6): 50 ug via ORAL
  Filled 2022-08-18 (×3): qty 1
  Filled 2022-08-18: qty 2
  Filled 2022-08-18 (×2): qty 1

## 2022-08-18 MED ORDER — INSULIN GLARGINE (2 UNIT DIAL) 300 UNIT/ML ~~LOC~~ SOPN: 20.0000 [IU] | PEN_INJECTOR | Freq: Every day | SUBCUTANEOUS | Status: DC

## 2022-08-18 MED ORDER — POLYETHYLENE GLYCOL 3350 17 G PO PACK
17.0000 g | PACK | Freq: Every day | ORAL | Status: DC | PRN
  Administered 2022-08-25: 17 g via ORAL
  Filled 2022-08-18: qty 1

## 2022-08-18 MED ORDER — AMLODIPINE BESYLATE 10 MG PO TABS
10.0000 mg | ORAL_TABLET | Freq: Every day | ORAL | Status: DC
  Administered 2022-08-20 – 2022-08-25 (×6): 10 mg via ORAL
  Filled 2022-08-18 (×7): qty 1

## 2022-08-18 MED ORDER — OXYCODONE HCL 5 MG/5ML PO SOLN: 5.0000 mg | Freq: Once | ORAL | Status: DC | PRN

## 2022-08-18 MED ORDER — LACTATED RINGERS IV SOLN: INTRAVENOUS | Status: DC

## 2022-08-18 MED ORDER — FENTANYL CITRATE PF 50 MCG/ML IJ SOSY
25.0000 ug | PREFILLED_SYRINGE | INTRAMUSCULAR | Status: DC | PRN
  Administered 2022-08-18: 25 ug via INTRAVENOUS

## 2022-08-18 MED ORDER — ACETAMINOPHEN 325 MG PO TABS
325.0000 mg | ORAL_TABLET | Freq: Four times a day (QID) | ORAL | Status: DC | PRN
  Administered 2022-08-21 – 2022-08-22 (×2): 325 mg via ORAL
  Filled 2022-08-18: qty 2

## 2022-08-18 MED ORDER — PANTOPRAZOLE SODIUM 40 MG PO TBEC
40.0000 mg | DELAYED_RELEASE_TABLET | Freq: Every day | ORAL | Status: DC
  Administered 2022-08-18 – 2022-08-25 (×8): 40 mg via ORAL
  Filled 2022-08-18 (×8): qty 1

## 2022-08-18 MED ORDER — ROSUVASTATIN CALCIUM 5 MG PO TABS
5.0000 mg | ORAL_TABLET | Freq: Every day | ORAL | Status: DC
  Administered 2022-08-18 – 2022-08-24 (×7): 5 mg via ORAL
  Filled 2022-08-18 (×8): qty 1

## 2022-08-18 MED ORDER — MONTELUKAST SODIUM 10 MG PO TABS
10.0000 mg | ORAL_TABLET | Freq: Every day | ORAL | Status: DC
  Administered 2022-08-19 – 2022-08-25 (×7): 10 mg via ORAL
  Filled 2022-08-18 (×7): qty 1

## 2022-08-18 MED ORDER — RISAQUAD PO CAPS
2.0000 | ORAL_CAPSULE | Freq: Three times a day (TID) | ORAL | Status: DC
  Administered 2022-08-18 – 2022-08-25 (×20): 2 via ORAL
  Filled 2022-08-18 (×20): qty 2

## 2022-08-18 MED ORDER — VITAMIN C 500 MG PO TABS
500.0000 mg | ORAL_TABLET | Freq: Every day | ORAL | Status: DC
  Administered 2022-08-19 – 2022-08-25 (×7): 500 mg via ORAL
  Filled 2022-08-18 (×7): qty 1

## 2022-08-18 MED ORDER — ALBUTEROL SULFATE (2.5 MG/3ML) 0.083% IN NEBU: 3.0000 mL | INHALATION_SOLUTION | Freq: Four times a day (QID) | RESPIRATORY_TRACT | Status: DC | PRN

## 2022-08-18 MED ORDER — CLONIDINE HCL 0.1 MG PO TABS
0.1000 mg | ORAL_TABLET | Freq: Three times a day (TID) | ORAL | Status: DC | PRN
  Administered 2022-08-19: 0.1 mg via ORAL
  Filled 2022-08-18: qty 1

## 2022-08-18 MED ORDER — HYDROMORPHONE HCL 1 MG/ML IJ SOLN
0.5000 mg | INTRAMUSCULAR | Status: DC | PRN
  Administered 2022-08-18 – 2022-08-19 (×2): 0.5 mg via INTRAVENOUS
  Administered 2022-08-19 – 2022-08-20 (×2): 1 mg via INTRAVENOUS
  Filled 2022-08-18 (×4): qty 1

## 2022-08-18 MED ORDER — 0.9 % SODIUM CHLORIDE (POUR BTL) OPTIME
TOPICAL | Status: DC | PRN
  Administered 2022-08-18: 1000 mL

## 2022-08-18 MED ORDER — VITAMIN E 45 MG (100 UNIT) PO CAPS
400.0000 [IU] | ORAL_CAPSULE | Freq: Every day | ORAL | Status: DC
  Administered 2022-08-19 – 2022-08-25 (×7): 400 [IU] via ORAL
  Filled 2022-08-18 (×7): qty 4

## 2022-08-18 MED ORDER — BUPROPION HCL ER (XL) 150 MG PO TB24
150.0000 mg | ORAL_TABLET | Freq: Every day | ORAL | Status: DC
  Administered 2022-08-19 – 2022-08-25 (×7): 150 mg via ORAL
  Filled 2022-08-18 (×7): qty 1

## 2022-08-18 MED ORDER — EPINEPHRINE PF 1 MG/ML IJ SOLN
INTRAMUSCULAR | Status: AC
  Filled 2022-08-18: qty 1

## 2022-08-18 MED ORDER — METOCLOPRAMIDE HCL 5 MG/ML IJ SOLN: 5.0000 mg | Freq: Three times a day (TID) | INTRAMUSCULAR | Status: DC | PRN

## 2022-08-18 MED ORDER — PHENOL 1.4 % MT LIQD: 1.0000 | OROMUCOSAL | Status: DC | PRN

## 2022-08-18 MED ORDER — BENZONATATE 100 MG PO CAPS: 200.0000 mg | ORAL_CAPSULE | Freq: Three times a day (TID) | ORAL | Status: DC | PRN

## 2022-08-18 MED ORDER — CYCLOSPORINE 0.05 % OP EMUL
2.0000 [drp] | Freq: Every day | OPHTHALMIC | Status: DC
  Filled 2022-08-18 (×2): qty 30

## 2022-08-18 MED ORDER — METOPROLOL TARTRATE 5 MG/5ML IV SOLN
INTRAVENOUS | Status: DC | PRN
  Administered 2022-08-18: 2 mg via INTRAVENOUS
  Administered 2022-08-18: 3 mg via INTRAVENOUS

## 2022-08-18 MED ORDER — PREGABALIN 75 MG PO CAPS
75.0000 mg | ORAL_CAPSULE | Freq: Every day | ORAL | Status: DC
  Administered 2022-08-18 – 2022-08-20 (×3): 75 mg via ORAL
  Filled 2022-08-18 (×3): qty 1

## 2022-08-18 MED ORDER — SPIRONOLACTONE 25 MG PO TABS
50.0000 mg | ORAL_TABLET | Freq: Every day | ORAL | Status: DC
  Administered 2022-08-18 – 2022-08-25 (×7): 50 mg via ORAL
  Filled 2022-08-18 (×8): qty 2

## 2022-08-18 MED ORDER — LEVOFLOXACIN IN D5W 500 MG/100ML IV SOLN
500.0000 mg | INTRAVENOUS | Status: AC
  Administered 2022-08-18: 500 mg via INTRAVENOUS
  Filled 2022-08-18: qty 100

## 2022-08-18 MED ORDER — OMEGA-3-ACID ETHYL ESTERS 1 G PO CAPS
1.0000 g | ORAL_CAPSULE | Freq: Every day | ORAL | Status: DC
  Administered 2022-08-18 – 2022-08-25 (×8): 1 g via ORAL
  Filled 2022-08-18 (×8): qty 1

## 2022-08-18 MED ORDER — DEXAMETHASONE SODIUM PHOSPHATE 10 MG/ML IJ SOLN
INTRAMUSCULAR | Status: DC | PRN
  Administered 2022-08-18: 5 mg via INTRAVENOUS

## 2022-08-18 MED ORDER — OXYCODONE-ACETAMINOPHEN 10-325 MG PO TABS: 1.0000 | ORAL_TABLET | Freq: Three times a day (TID) | ORAL | 0 refills | Status: DC | PRN

## 2022-08-18 MED ORDER — HYDRALAZINE HCL 20 MG/ML IJ SOLN
10.0000 mg | Freq: Four times a day (QID) | INTRAMUSCULAR | Status: DC | PRN
  Administered 2022-08-18 – 2022-08-19 (×2): 10 mg via INTRAVENOUS
  Filled 2022-08-18: qty 1

## 2022-08-18 MED ORDER — CHLORHEXIDINE GLUCONATE 0.12 % MT SOLN
15.0000 mL | Freq: Once | OROMUCOSAL | Status: AC
  Administered 2022-08-18: 15 mL via OROMUCOSAL

## 2022-08-18 MED ORDER — CALCIUM CARB-CHOLECALCIFEROL 600-20 MG-MCG PO CHEW: 1.0000 | CHEWABLE_TABLET | Freq: Every day | ORAL | Status: DC

## 2022-08-18 MED ORDER — OYSTER SHELL CALCIUM/D3 500-5 MG-MCG PO TABS
1.0000 | ORAL_TABLET | Freq: Every day | ORAL | Status: DC
  Administered 2022-08-19 – 2022-08-25 (×7): 1 via ORAL
  Filled 2022-08-18 (×7): qty 1

## 2022-08-18 MED ORDER — NYSTATIN 100000 UNIT/GM EX POWD: 1.0000 | Freq: Two times a day (BID) | CUTANEOUS | Status: DC | PRN

## 2022-08-18 MED ORDER — LABETALOL HCL 5 MG/ML IV SOLN: 5.0000 mg | INTRAVENOUS | Status: DC | PRN

## 2022-08-18 MED ORDER — BUPIVACAINE HCL (PF) 0.5 % IJ SOLN
INTRAMUSCULAR | Status: DC | PRN
  Administered 2022-08-18: 10 mL via PERINEURAL

## 2022-08-18 MED ORDER — LAMOTRIGINE 100 MG PO TABS
100.0000 mg | ORAL_TABLET | Freq: Two times a day (BID) | ORAL | Status: DC
  Administered 2022-08-18 – 2022-08-25 (×14): 100 mg via ORAL
  Filled 2022-08-18 (×15): qty 1

## 2022-08-18 MED ORDER — LORAZEPAM 0.5 MG PO TABS
0.5000 mg | ORAL_TABLET | Freq: Two times a day (BID) | ORAL | Status: DC
  Administered 2022-08-18 – 2022-08-20 (×5): 0.5 mg via ORAL
  Filled 2022-08-18 (×5): qty 1

## 2022-08-18 MED ORDER — IRBESARTAN 150 MG PO TABS
75.0000 mg | ORAL_TABLET | Freq: Every day | ORAL | Status: DC
  Administered 2022-08-18 – 2022-08-23 (×6): 75 mg via ORAL
  Filled 2022-08-18 (×6): qty 1

## 2022-08-18 MED ORDER — CLOBETASOL PROPIONATE 0.05 % EX SOLN: 1.0000 | CUTANEOUS | Status: DC

## 2022-08-18 MED ORDER — INSULIN ASPART 100 UNIT/ML IJ SOLN
4.0000 [IU] | Freq: Three times a day (TID) | INTRAMUSCULAR | Status: DC
  Administered 2022-08-19 – 2022-08-25 (×13): 4 [IU] via SUBCUTANEOUS

## 2022-08-18 MED ORDER — ORAL CARE MOUTH RINSE: 15.0000 mL | Freq: Once | OROMUCOSAL | Status: AC

## 2022-08-18 MED ORDER — FENTANYL CITRATE (PF) 100 MCG/2ML IJ SOLN
INTRAMUSCULAR | Status: AC
  Filled 2022-08-18: qty 2

## 2022-08-18 MED ORDER — ONDANSETRON HCL 4 MG/2ML IJ SOLN
4.0000 mg | Freq: Four times a day (QID) | INTRAMUSCULAR | Status: DC | PRN
  Administered 2022-08-20: 4 mg via INTRAVENOUS
  Filled 2022-08-18: qty 2

## 2022-08-18 MED ORDER — BUPIVACAINE-EPINEPHRINE (PF) 0.25% -1:200000 IJ SOLN
INTRAMUSCULAR | Status: DC | PRN
  Administered 2022-08-18: 10 mL

## 2022-08-18 MED ORDER — SUGAMMADEX SODIUM 200 MG/2ML IV SOLN
INTRAVENOUS | Status: DC | PRN
  Administered 2022-08-18 (×2): 200 mg via INTRAVENOUS

## 2022-08-18 MED ORDER — ASPIRIN 81 MG PO TBEC
81.0000 mg | DELAYED_RELEASE_TABLET | Freq: Every day | ORAL | Status: DC
  Administered 2022-08-19 – 2022-08-25 (×7): 81 mg via ORAL
  Filled 2022-08-18 (×7): qty 1

## 2022-08-18 MED ORDER — LORATADINE 10 MG PO TABS: 10.0000 mg | ORAL_TABLET | Freq: Every day | ORAL | Status: DC | PRN

## 2022-08-18 MED ORDER — FENTANYL CITRATE PF 50 MCG/ML IJ SOSY
PREFILLED_SYRINGE | INTRAMUSCULAR | Status: AC
  Filled 2022-08-18: qty 1

## 2022-08-18 MED ORDER — ONDANSETRON HCL 4 MG PO TABS: 4.0000 mg | ORAL_TABLET | Freq: Four times a day (QID) | ORAL | Status: DC | PRN

## 2022-08-18 MED ORDER — ONDANSETRON HCL 4 MG/2ML IJ SOLN
INTRAMUSCULAR | Status: AC
  Filled 2022-08-18: qty 2

## 2022-08-18 MED ORDER — NALOXONE HCL 0.4 MG/ML IJ SOLN
INTRAMUSCULAR | Status: AC
  Filled 2022-08-18: qty 1

## 2022-08-18 MED ORDER — VITAMIN D 25 MCG (1000 UNIT) PO TABS
1000.0000 [IU] | ORAL_TABLET | Freq: Every day | ORAL | Status: DC
  Administered 2022-08-19 – 2022-08-25 (×7): 1000 [IU] via ORAL
  Filled 2022-08-18 (×7): qty 1

## 2022-08-18 MED ORDER — POTASSIUM CHLORIDE CRYS ER 10 MEQ PO TBCR
10.0000 meq | EXTENDED_RELEASE_TABLET | Freq: Two times a day (BID) | ORAL | Status: DC
  Administered 2022-08-18 – 2022-08-24 (×12): 10 meq via ORAL
  Filled 2022-08-18 (×14): qty 1

## 2022-08-18 MED ORDER — MIDAZOLAM HCL 2 MG/2ML IJ SOLN
INTRAMUSCULAR | Status: AC
  Filled 2022-08-18: qty 2

## 2022-08-18 MED ORDER — ONDANSETRON HCL 4 MG/2ML IJ SOLN: 4.0000 mg | Freq: Once | INTRAMUSCULAR | Status: DC | PRN

## 2022-08-18 MED ORDER — DEXAMETHASONE SODIUM PHOSPHATE 10 MG/ML IJ SOLN
INTRAMUSCULAR | Status: AC
  Filled 2022-08-18: qty 1

## 2022-08-18 MED ORDER — ORAL CARE MOUTH RINSE: 15.0000 mL | OROMUCOSAL | Status: DC | PRN

## 2022-08-18 MED ORDER — FREESTYLE LIBRE 14 DAY SENSOR MISC: 1.0000 | Status: DC

## 2022-08-18 MED ORDER — TRANEXAMIC ACID-NACL 1000-0.7 MG/100ML-% IV SOLN
1000.0000 mg | INTRAVENOUS | Status: AC
  Administered 2022-08-18: 1000 mg via INTRAVENOUS
  Filled 2022-08-18: qty 100

## 2022-08-18 MED ORDER — ACETAMINOPHEN 325 MG PO TABS
650.0000 mg | ORAL_TABLET | Freq: Four times a day (QID) | ORAL | Status: DC | PRN
  Administered 2022-08-19 – 2022-08-20 (×3): 650 mg via ORAL
  Filled 2022-08-18 (×4): qty 2

## 2022-08-18 MED ORDER — FENTANYL CITRATE PF 50 MCG/ML IJ SOSY
50.0000 ug | PREFILLED_SYRINGE | INTRAMUSCULAR | Status: DC
  Administered 2022-08-18: 50 ug via INTRAVENOUS
  Filled 2022-08-18: qty 2

## 2022-08-18 SURGICAL SUPPLY — 69 items
AID PSTN UNV HD RSTRNT DISP (MISCELLANEOUS) ×1
BAG COUNTER SPONGE SURGICOUNT (BAG) IMPLANT
BAG SPEC THK2 15X12 ZIP CLS (MISCELLANEOUS)
BAG SPNG CNTER NS LX DISP (BAG)
BAG ZIPLOCK 12X15 (MISCELLANEOUS) IMPLANT
BIT DRILL 1.6MX128 (BIT) IMPLANT
BIT DRILL 170X2.5X (BIT) IMPLANT
BIT DRL 170X2.5X (BIT) ×1 IMPLANT
BLADE SAG 18X100X1.27 (BLADE) ×1 IMPLANT
COVER BACK TABLE 60X90IN (DRAPES) ×1 IMPLANT
COVER SURGICAL LIGHT HANDLE (MISCELLANEOUS) ×1 IMPLANT
DRAPE INCISE IOBAN 66X45 STRL (DRAPES) ×1 IMPLANT
DRAPE ORTHO SPLIT 77X108 STRL (DRAPES) ×2
DRAPE SHEET LG 3/4 BI-LAMINATE (DRAPES) ×1 IMPLANT
DRAPE SURG ORHT 6 SPLT 77X108 (DRAPES) ×2 IMPLANT
DRAPE TOP 10253 STERILE (DRAPES) ×1 IMPLANT
DRAPE U-SHAPE 47X51 STRL (DRAPES) ×1 IMPLANT
DRILL 2.5 (BIT) ×1
DRSG ADAPTIC 3X8 NADH LF (GAUZE/BANDAGES/DRESSINGS) ×1 IMPLANT
DURAPREP 26ML APPLICATOR (WOUND CARE) ×1 IMPLANT
ELECT BLADE TIP CTD 4 INCH (ELECTRODE) ×1 IMPLANT
ELECT NDL TIP 2.8 STRL (NEEDLE) ×1 IMPLANT
ELECT NEEDLE TIP 2.8 STRL (NEEDLE) ×1 IMPLANT
ELECT REM PT RETURN 15FT ADLT (MISCELLANEOUS) ×1 IMPLANT
EPI LT SZ 1 (Orthopedic Implant) ×1 IMPLANT
EPIPHYSIS LT SZ 1 (Orthopedic Implant) IMPLANT
FACESHIELD WRAPAROUND (MASK) ×1 IMPLANT
FACESHIELD WRAPAROUND OR TEAM (MASK) ×1 IMPLANT
GAUZE PAD ABD 7.5X8 STRL (GAUZE/BANDAGES/DRESSINGS) IMPLANT
GAUZE PAD ABD 8X10 STRL (GAUZE/BANDAGES/DRESSINGS) ×1 IMPLANT
GAUZE SPONGE 4X4 12PLY STRL (GAUZE/BANDAGES/DRESSINGS) ×1 IMPLANT
GLENOSPHERE DELTA XTEND LAT 38 (Miscellaneous) IMPLANT
GLOVE BIOGEL PI IND STRL 7.5 (GLOVE) ×1 IMPLANT
GLOVE BIOGEL PI IND STRL 8.5 (GLOVE) ×1 IMPLANT
GLOVE ORTHO TXT STRL SZ7.5 (GLOVE) ×1 IMPLANT
GLOVE SURG ORTHO 8.5 STRL (GLOVE) ×1 IMPLANT
GOWN STRL REUS W/ TWL XL LVL3 (GOWN DISPOSABLE) ×2 IMPLANT
GOWN STRL REUS W/TWL XL LVL3 (GOWN DISPOSABLE) ×2
KIT BASIN OR (CUSTOM PROCEDURE TRAY) ×1 IMPLANT
KIT TURNOVER KIT A (KITS) IMPLANT
MANIFOLD NEPTUNE II (INSTRUMENTS) ×1 IMPLANT
METAGLENE DELTA EXTEND (Trauma) IMPLANT
METAGLENE DXTEND (Trauma) ×1 IMPLANT
NDL MAYO CATGUT SZ4 TPR NDL (NEEDLE) IMPLANT
NEEDLE MAYO CATGUT SZ4 (NEEDLE) IMPLANT
NS IRRIG 1000ML POUR BTL (IV SOLUTION) ×1 IMPLANT
PACK SHOULDER (CUSTOM PROCEDURE TRAY) ×1 IMPLANT
PIN GUIDE 1.2 (PIN) IMPLANT
PIN GUIDE GLENOPHERE 1.5MX300M (PIN) IMPLANT
PIN METAGLENE 2.5 (PIN) IMPLANT
RESTRAINT HEAD UNIVERSAL NS (MISCELLANEOUS) ×1 IMPLANT
SCREW 4.5X36MM (Screw) IMPLANT
SCREW LOCK 42 (Screw) IMPLANT
SLING ARM FOAM STRAP LRG (SOFTGOODS) IMPLANT
SPACER 38 PLUS 3 (Spacer) IMPLANT
SPIKE FLUID TRANSFER (MISCELLANEOUS) ×1 IMPLANT
SPONGE T-LAP 4X18 ~~LOC~~+RFID (SPONGE) IMPLANT
STEM HUMERAL SZ8 STANDARD (Stem) ×1 IMPLANT
STEM HUMERAL SZ8 STD (Stem) IMPLANT
STRIP CLOSURE SKIN 1/2X4 (GAUZE/BANDAGES/DRESSINGS) ×1 IMPLANT
SUT FIBERWIRE #2 38 T-5 BLUE (SUTURE) ×1 IMPLANT
SUT MNCRL AB 4-0 PS2 18 (SUTURE) ×1 IMPLANT
SUT VIC AB 0 CT1 36 (SUTURE) ×1 IMPLANT
SUT VIC AB 0 CT2 27 (SUTURE) ×1 IMPLANT
SUT VIC AB 2-0 CT1 27 (SUTURE) ×1
SUT VIC AB 2-0 CT1 TAPERPNT 27 (SUTURE) ×1 IMPLANT
SUTURE FIBERWR #2 38 T-5 BLUE (SUTURE) ×1 IMPLANT
TAPE CLOTH SURG 6X10 WHT LF (GAUZE/BANDAGES/DRESSINGS) IMPLANT
TOWEL OR 17X26 10 PK STRL BLUE (TOWEL DISPOSABLE) ×1 IMPLANT

## 2022-08-18 NOTE — Anesthesia Procedure Notes (Signed)
Anesthesia Regional Block: Supraclavicular block   Pre-Anesthetic Checklist: , timeout performed,  Correct Patient, Correct Site, Correct Laterality,  Correct Procedure, Correct Position, site marked,  Risks and benefits discussed,  Surgical consent,  Pre-op evaluation,  At surgeon's request and post-op pain management  Laterality: Left  Prep: chloraprep       Needles:  Injection technique: Single-shot  Needle Type: Echogenic Stimulator Needle     Needle Length: 5cm  Needle Gauge: 22     Additional Needles:   Procedures:, nerve stimulator,,, ultrasound used (permanent image in chart),,     Nerve Stimulator or Paresthesia:  Response: supraspinatus, 0.45 mA  Additional Responses:   Narrative:  Start time: 08/18/2022 10:40 AM End time: 08/18/2022 10:45 AM Injection made incrementally with aspirations every 5 mL.  Performed by: Personally  Anesthesiologist: Beryle Lathe, MD  Additional Notes: Functioning IV was confirmed and monitors were applied.  A 50mm 22ga Arrow echogenic stimulator needle was used. Sterile prep and drape,hand hygiene and sterile gloves were used. Ultrasound guidance: relevant anatomy identified, needle position confirmed, local anesthetic spread visualized around nerve(s)., vascular puncture avoided.  Image printed for medical record. Negative aspiration and negative test dose prior to incremental administration of local anesthetic. The patient tolerated the procedure well.

## 2022-08-18 NOTE — Consult Note (Addendum)
[facility] Initial Consultation Note  [name] JYN:[id number] DOB: [birth year] DOA: 08/18/2022  PCP: Kerri Perches, MD   Requesting Physician: Dr. Ranell Patrick   Reason for Consultation: Medical Management of HTN  HPI: [name] is a 72 y.o. female with medical history significant for hypertension, bipolar disorder, insulin-dependent type 2 diabetes who was admitted to the hospital after elective left shoulder replacement today with Dr. Devonne Doughty of orthopedic surgery, who is requesting medical consultation due to difficult to control hypertension.  Patient's operative course has been unremarkable, though apparently she was a little difficult to wake up after anesthesia in the PACU.  That has now resolved, patient was seen at the request of Dr. Devonne Doughty on stepdown unit.  Postoperatively, her blood pressure has been as high as 245/107, she has received IV labetalol improvement in her blood pressure, but it has not stayed in the normal range.  She was seen with her close family member at the bedside.  She is awake alert and oriented, speech is slightly slurred but family member at the bedside states that this is normal for her.  Patient lives alone and manages her own medications.  She is pretty sure that she did not take any of her home medications this morning, since she had to be here quite early.  At home, she takes Norvasc, Lopressor, Aldactone for her blood pressure.  Currently she is resting comfortably, says she has about 6 out of 10 pain in her left shoulder, he also has a slight headache.  Review of Systems: Please see HPI for pertinent positives and negatives. A complete 10 system review of systems are otherwise negative.  Past Medical History:  Diagnosis Date   Allergy    Anemia    Anemia in chronic kidney disease (CKD) 08/10/2021   Anemia in chronic kidney disease (CKD) 10/12/2021   Anxiety    takes Ativan daily   Arthritis    Assistance needed for mobility    Bipolar  disorder (HCC)    takes Risperdal nightly   Blood transfusion    Brain tumor (HCC)    Cancer (HCC)    In her gum   Carpal tunnel syndrome of right wrist 05/23/2011   Cervical disc disorder with radiculopathy of cervical region 10/31/2012   Chronic back pain    Chronic idiopathic constipation    Chronic neck and back pain    Colon polyps    COPD (chronic obstructive pulmonary disease) with chronic bronchitis 09/16/2013   Office Spirometry 10/30/2013-submaximal effort based on appearance of loop and curve. Numbers would fit with severe restriction but her physiologic capability may be better than this. FVC 0.91/44%, and 10.74/45%, FEV1/FVC 0.81, FEF 25-75% 1.43/69%     Diabetes mellitus    Type II   Diverticulosis    TCS 9/08 by Dr. Lina Sar for diarrhea . Bx for micro scopic colitis negative.    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   Metabolic encephalopathy 08/03/2011   Migraines    chronic headaches   Mononeuritis lower limb    Narcolepsy    Osteoporosis    Pancreatitis 2006   due to Depakote with normal EUS    Paralysis (HCC)    Pneumonia    Schatzki's ring    non critical / EGD with ED 8/2011with RMR   Seizures (HCC)    takes Lamictal daily.Last seizure 3 yrs ago   Sleep apnea    on CPAP   Small bowel obstruction (HCC)    Stroke (HCC)    left sided weakness, speech changes   Tubular adenoma of colon    Past Surgical History:  Procedure Laterality Date   ABDOMINAL HYSTERECTOMY   1978   BACK SURGERY  July 2012   BACTERIAL OVERGROWTH TEST N/A 05/05/2013   Procedure: BACTERIAL OVERGROWTH TEST;  Surgeon: Corbin Ade, MD;  Location: [facility];  Service: Endoscopy;  Laterality: N/A;  7:30   BIOPSY THYROID  2009   BRAIN SURGERY  11/2011   resection of meningioma   BREAST REDUCTION SURGERY  1994   CARDIAC CATHETERIZATION  05/10/2005   normal coronaries, normal LV systolic function and EF (Dr. Evlyn Courier)   CARPAL TUNNEL RELEASE Left 07/22/04   Dr. Romeo Apple   CATARACT EXTRACTION Bilateral    CHOLECYSTECTOMY  1984   COLONOSCOPY N/A 09/25/2012   [id number] diverticulosis.  colonic polyp-removed : tubular adenoma   CRANIOTOMY  11/23/2011   Procedure: CRANIOTOMY TUMOR EXCISION;  Surgeon: Hewitt Shorts, MD;  Location: [facility];  Service: Neurosurgery;  Laterality: N/A;  Craniotomy for tumor resection   ESOPHAGOGASTRODUODENOSCOPY  12/29/2010   Rourk-Retained food in the esophagus and stomach, small hiatal hernia, status post Maloney dilation of the esophagus   ESOPHAGOGASTRODUODENOSCOPY N/A 09/25/2012   [id number] atonic baggy esophagus status post Maloney dilation 56 F. Hiatal hernia   GIVENS CAPSULE STUDY N/A 01/15/2013   NORMAL.    IR GENERIC HISTORICAL  03/17/2016   IR RADIOLOGIST EVAL & MGMT 03/17/2016 [facility]   LESION REMOVAL N/A 05/31/2015   Procedure: REMOVAL RIGHT AND LEFT LESIONS OF MANDIBLE;  Surgeon: Ocie Doyne, DDS;  Location: [facility] OR;  Service: Oral Surgery;  Laterality: N/A;   MALONEY DILATION  12/29/2010   RMR;   NM MYOCAR PERF WALL MOTION  2006   "relavtiely normal" persantine, mild anterior thinning (breast attenuation artifact), no region of scar/ischemia   OVARIAN CYST REMOVAL     RECTOCELE REPAIR N/A 06/29/2015   Procedure: POSTERIOR REPAIR (RECTOCELE);  Surgeon: Tilda Burrow, MD;  Location: [facility] ORS;  Service: Gynecology;  Laterality: N/A;   REDUCTION MAMMAPLASTY Bilateral    SPINE SURGERY  09/29/2010   Dr. Shon Baton   surgical excision of  3 tumors from right thigh and right buttock  and left upper thigh  2010   TOOTH EXTRACTION Bilateral 12/14/2014   Procedure: REMOVAL OF BILATERAL MANDIBULAR EXOSTOSES;  Surgeon: Ocie Doyne, DDS;  Location: [facility] OR;  Service: Oral Surgery;  Laterality: Bilateral;   TRANSTHORACIC ECHOCARDIOGRAM  2010   EF 60-65%, mild conc LVH, grade 1 diastolic dysfunction; mildly calcified MV annulus with mildly thickened leaflets, mildly calcified MR annulus    Social History:  reports that she quit smoking about 13 months ago. Her smoking use included cigarettes. She has a 23.50 pack-year smoking history. She has been exposed to tobacco smoke. She has never used smokeless tobacco. She reports that she  does not drink alcohol and does not use drugs.  Allergies  Allergen Reactions   Iron Itching and Nausea And Vomiting   Milk (Cow) Rash and Other (See Comments)    Doesn't agree with the stomach, also    Penicillins Hives   Phenazopyridine Hives   Cephalexin Hives   Flonase [Fluticasone] Other (See Comments)    "It gave me ulcers in my nose"   Milk-Related Compounds Nausea Only and Other (See Comments)    Doesn't agree with the stomach    Family History  Problem Relation Age of Onset   Heart attack Mother        HTN   Pneumonia Father    Kidney failure Father    Diabetes Father    Pancreatic cancer Sister    Cancer Sister        breast    Cancer Sister        pancreatic   Diabetes Brother    Hypertension Brother    Diabetes Brother    Alcohol abuse Maternal Uncle    Stroke Maternal Grandmother    Heart attack Maternal Grandfather    Hypertension Son    Sleep apnea Son    Colon cancer Neg Hx    Anesthesia problems Neg Hx    Hypotension Neg Hx    Malignant hyperthermia Neg Hx    Pseudochol deficiency Neg Hx    Breast cancer Neg Hx    Stomach cancer Neg Hx      Prior to Admission medications   Medication Sig Start Date End Date Taking? Authorizing Provider  ACCU-CHEK GUIDE test strip  USE TO CHECK BLOOD SUGAR FOUR TIMES A DAY AND AS NEEDED (NEED MD APPOINTMENT FOR ANY FURTHER REFILLS) 05/10/22  Yes Carlus Pavlov, MD  Accu-Chek Softclix Lancets lancets TEST BLOOD SUGAR THREE TIMES DAILY AS DIRECTED 03/28/21  Yes Carlus Pavlov, MD  acetaminophen (TYLENOL) 325 MG tablet Take 650 mg by mouth every 6 (six) hours as needed for mild pain, moderate pain or headache.   Yes [provider]  albuterol (VENTOLIN HFA) 108 (90 Base) MCG/ACT inhaler INHALE 1 TO 2 PUFFS EVERY 6 HOURS AS NEEDED FOR WHEEZING, SHORTNESS OF BREATH Patient taking differently: Inhale 1-2 puffs into the lungs every 6 (six) hours as needed for wheezing or shortness of breath. 06/24/21  Yes Young, Joni Fears D, MD  Alcohol Swabs (DROPSAFE ALCOHOL PREP) 70 % PADS USE TO CLEAN FINGER PRIOR TO TESTING FOR BLOOD SUGAR AS DIRECTED 05/10/22  Yes Carlus Pavlov, MD  alendronate (FOSAMAX) 70 MG tablet TAKE 1 TABLET EVERY 7 DAYS ON AN EMPTY STOMACH WITH A FULL GLASS OF WATER Patient taking differently: Take 70 mg by mouth once a week. 11/07/21  Yes Kerri Perches, MD  amLODipine (NORVASC) 10 MG tablet Take 1 tablet (10 mg total) by mouth daily. 08/14/22  Yes Kerri Perches, MD  ascorbic acid (VITAMIN C) 500 MG tablet Take 500 mg by mouth daily.   Yes [provider]  aspirin EC 81 MG tablet Take 1 tablet (81 mg total) by mouth daily with breakfast. 12/08/18  Yes Emokpae, Courage, MD  Blood Glucose Calibration (ACCU-CHEK GUIDE CONTROL) LIQD USE AS DIRECTED 12/13/20  Yes Carlus Pavlov, MD  blood glucose meter kit and supplies Dispense based on patient and insurance preference. Use up to four times daily as directed. (FOR ICD-10 E10.9, E11.9). 06/27/19  Yes Carlus Pavlov, MD  buPROPion (WELLBUTRIN XL) 150 MG 24 hr tablet Take 1 tablet (150 mg total)  by mouth every morning. 07/06/22 07/06/23 Yes Myrlene Broker, MD  Calcium Carb-Cholecalciferol (CALTRATE 600+D3 SOFT) 600-20 MG-MCG CHEW Chew 1 tablet by  mouth daily.   Yes [provider]  cetirizine (ZYRTEC) 10 MG tablet Take 1 tablet (10 mg total) by mouth daily. 04/25/21  Yes Paseda, Baird Kay, FNP  Cholecalciferol (D3 PO) Take 1 tablet by mouth daily.   Yes [provider]  clobetasol (TEMOVATE) 0.05 % external solution Apply 1 Application topically 2 (two) times daily. Patient taking differently: Apply 1 Application topically See admin instructions. Apply to affected area(s) 2 times a day 06/28/22  Yes Kerri Perches, MD  Docusate Sodium (DSS) 100 MG CAPS Take 100 mg by mouth in the morning and at bedtime.   Yes [provider]  doxycycline (VIBRA-TABS) 100 MG tablet Take 1 tablet (100 mg total) by mouth 2 (two) times daily for 10 days. 08/09/22 08/19/22 Yes Del Nigel Berthold, FNP  DROPLET PEN NEEDLES 31G X 8 MM MISC USE FOR INJECTING INSULIN 4 TIMES DAILY. 09/02/21  Yes Carlus Pavlov, MD  Elastic Bandages & Supports (ADJUSTABLE ARM SLING) MISC L arm sling 05/04/22  Yes Kerri Perches, MD  ezetimibe (ZETIA) 10 MG tablet TAKE 1 TABLET EVERY DAY Patient taking differently: Take 10 mg by mouth daily. 01/19/22  Yes Kerri Perches, MD  hydrOXYzine (ATARAX) 10 MG tablet Take one tablet by mouth once daily, as needed, for excessive itching 08/09/22  Yes Del Newman Nip, Sylvarena, FNP  insulin aspart (NOVOLOG FLEXPEN) 100 UNIT/ML FlexPen Inject 5 Units into the skin 3 (three) times daily with meals. Give if eats 50% or more of meal. Patient taking differently: Inject 5 Units into the skin 3 (three) times daily with meals. Inject 5 units into the skin three times a day with meals and use only if 50% or more of a meal is eaten 07/21/22  Yes Carlus Pavlov, MD  lactobacillus acidophilus (BACID) TABS tablet Take 2 tablets by mouth 3 (three) times daily.   Yes [provider]  lamoTRIgine (LAMICTAL) 100 MG tablet Take 1 tablet (100 mg total) by mouth 2 (two) times daily. 07/06/22  Yes Myrlene Broker, MD   levothyroxine (SYNTHROID) 50 MCG tablet TAKE 1 TABLET DAILY SIX DAYS A WEEK AND 1/2 TABLET ON ONE DAY A WEEK Patient taking differently: Take 25-50 mcg by mouth See admin instructions. Take 25 mcg by mouth before breakfast on Sunday and 50 mcg on Mon/Tues/Wed/Thurs/Fri/Sat 01/30/22  Yes Carlus Pavlov, MD  LORazepam (ATIVAN) 0.5 MG tablet Take 1 tablet (0.5 mg total) by mouth 2 (two) times daily. 07/06/22  Yes Myrlene Broker, MD  metoprolol tartrate (LOPRESSOR) 50 MG tablet TAKE 1 TABLET TWICE DAILY (NEED MD APPOINTMENT) Patient taking differently: Take 50 mg by mouth 2 (two) times daily. 08/29/21  Yes Strader, Lennart Pall, PA-C  Misc. Devices (MATTRESS PAD) MISC FIRM MATTRESS PAD for hospital bed x 1 02/22/21  Yes Kerri Perches, MD  montelukast (SINGULAIR) 10 MG tablet TAKE 1 TABLET EVERY DAY Patient taking differently: Take 10 mg by mouth daily. 05/10/22  Yes Kerri Perches, MD  Multiple Vitamins-Minerals (MULTIVITAMIN GUMMIES ADULT PO) Take 1 tablet by mouth daily.   Yes [provider]  MYRBETRIQ 25 MG TB24 tablet TAKE 1 TABLET EVERY DAY 05/10/22  Yes Kerri Perches, MD  nicotine (NICODERM CQ) 21 mg/24hr patch Place 1 patch (21 mg total) onto the skin daily. 06/20/22  Yes Waymon Budge, MD  nystatin (MYCOSTATIN/NYSTOP) powder Apply 1 Application topically 2 (two) times daily.   Yes [provider]  Omega-3 Fatty Acids (FISH OIL PO) Take 1 capsule by mouth daily.   Yes [provider]  pregabalin (LYRICA) 75 MG capsule Take 1 capsule (75 mg total) by mouth daily. 10/20/16  Yes Elliot Cousin, MD  RABEprazole (ACIPHEX) 20 MG tablet TAKE 1 TABLET TWICE DAILY 11/09/21  Yes Pyrtle, Carie Caddy, MD  RESTASIS 0.05 % ophthalmic emulsion Place 2 drops into both eyes daily. 07/29/20  Yes [provider]  risperiDONE (RISPERDAL) 0.5 MG tablet Take 1 tablet (0.5 mg total) by mouth at bedtime. 07/06/22  Yes Myrlene Broker, MD  rosuvastatin (CRESTOR) 5 MG tablet  TAKE 1 TABLET AT BEDTIME 06/12/22  Yes Kerri Perches, MD  sertraline (ZOLOFT) 100 MG tablet Take 1 tablet (100 mg total) by mouth every morning. 07/06/22  Yes Myrlene Broker, MD  traZODone (DESYREL) 150 MG tablet Take 1 tablet (150 mg total) by mouth at bedtime. 07/06/22  Yes Myrlene Broker, MD  vitamin E 180 MG (400 UNITS) capsule Take 400 Units by mouth daily.   Yes [provider]  albuterol (PROVENTIL) (2.5 MG/3ML) 0.083% nebulizer solution Take 3 mLs (2.5 mg total) by nebulization every 6 (six) hours as needed for wheezing or shortness of breath. 11/25/21   Icard, Rachel Bo, DO  benzonatate (TESSALON) 200 MG capsule Take 1 capsule (200 mg total) by mouth 3 (three) times daily as needed for cough. 03/17/22   Waymon Budge, MD  Continuous Blood Gluc Sensor (FREESTYLE LIBRE 14 DAY SENSOR) MISC 1 each by Does not apply route every 14 (fourteen) days. Change every 2 weeks Patient taking differently: Inject 1 Device into the skin every 14 (fourteen) days. 06/24/19   Carlus Pavlov, MD  diazepam (VALIUM) 5 MG tablet Take 1 tablet (5 mg total) by mouth once as needed (MRI claustrophobia). 04/27/22   Henreitta Leber, MD  ipratropium (ATROVENT) 0.03 % nasal spray Place 2 sprays into both nostrils every 12 (twelve) hours. 01/16/22   Glenford Bayley, NP  methocarbamol (ROBAXIN-750) 750 MG tablet Take 1 tablet (750 mg total) by mouth every 8 (eight) hours as needed for muscle spasms. 08/14/22   Kerri Perches, MD  oxyCODONE-acetaminophen (PERCOCET) 10-325 MG tablet Take 1 tablet by mouth every 8 (eight) hours as needed for pain. 08/18/22   Beverely Low, MD  potassium chloride (KLOR-CON) 10 MEQ tablet Take 1 tablet (10 mEq total) by mouth 2 (two) times daily. 02/01/22   Kerri Perches, MD  spironolactone (ALDACTONE) 50 MG tablet TAKE 1 TABLET (50 MG TOTAL) BY MOUTH DAILY. DISCONTINUE SPIRONOLACTONE 25MG  Patient taking differently: Take 50 mg by mouth daily. 04/18/22   Kerri Perches,  MD  telmisartan (MICARDIS) 20 MG tablet Take 10 mg by mouth daily. 11/23/21   [provider]  TOUJEO MAX SOLOSTAR 300 UNIT/ML Solostar Pen INJECT 20-26 UNITS INTO THE SKIN DAILY. Patient taking differently: Inject 20-26 Units into the skin at bedtime. 04/24/22   Carlus Pavlov, MD  umeclidinium-vilanterol [facility] ELLIPTA) 62.5-25 MCG/ACT AEPB Inhale 1 puff into the lungs daily. 06/20/22   Waymon Budge, MD  UNABLE TO FIND Med Name:  G5 Mattress 07/31/22   Kerri Perches, MD    Physical Exam: BP (!) 204/73 (BP Location: Right Arm) Comment: Scheduled PO meds given  Pulse 80   Temp 97.8 F (36.6 C) (Oral)   Resp 20   Ht 4'  11" (1.499 m)   Wt 78.8 kg   SpO2 100%   BMI 35.09 kg/m   General:  Alert, oriented, calm, in no acute distress, family member at bedside Eyes: EOMI, clear conjuctivae, white sclerea Neck: supple, no masses, trachea mildline  Cardiovascular: RRR, no murmurs or rubs, no peripheral edema  Respiratory: clear to auscultation bilaterally, no wheezes, no crackles  Abdomen: soft, nontender, nondistended, normal bowel tones heard  Skin: dry, no rashes  Musculoskeletal: no joint effusions, normal range of motion for left shoulder, which is in a sling Psychiatric: appropriate affect, normal speech  Neurologic: extraocular muscles intact, slightly slurred speech, though patient and family member at bedside states this is normal for her, moving all extremities with intact sensorium          Recent Labs and Imaging Reviewed:  Basic Metabolic Panel: Recent Labs  Lab 08/18/22 1435  NA 138  K 4.0  CL 106  CO2 22  GLUCOSE 233*  BUN 14  CREATININE 0.80  CALCIUM 8.6*   Liver Function Tests: No results for input(s): "AST", "ALT", "ALKPHOS", "BILITOT", "PROT", "ALBUMIN" in the last 168 hours. No results for input(s): "LIPASE", "AMYLASE" in the last 168 hours. No results for input(s): "AMMONIA" in the last 168 hours. CBC: Recent Labs  Lab 08/15/22 1344  08/18/22 1435  WBC 6.2 7.9  NEUTROABS  --  5.8  HGB 10.5* 10.9*  HCT 36.4 38.7  MCV 80.5 81.6  PLT 233 230   Cardiac Enzymes: No results for input(s): "CKTOTAL", "CKMB", "CKMBINDEX", "TROPONINI" in the last 168 hours.  BNP (last 3 results) No results for input(s): "BNP" in the last 8760 hours.  ProBNP (last 3 results) No results for input(s): "PROBNP" in the last 8760 hours.  CBG: Recent Labs  Lab 08/18/22 0732 08/18/22 1416 08/18/22 1630  GLUCAP 115* 222* 196*    Radiological Exams on Admission: DG Shoulder Left Port  Result Date: 08/18/2022 CLINICAL DATA:  History of total shoulder replacement. EXAM: LEFT SHOULDER one-view COMPARISON:  Preop x-ray 04/27/2022 FINDINGS: Acute surgical changes of reverse shoulder arthroplasty. Press-Fit humeral component. Screw fixated glenoid component. Expected alignment. Mild soft tissue gas. Imaging was obtained to aid in treatment. IMPRESSION: Acute surgical changes of reverse total shoulder arthroplasty Electronically Signed   By: Karen Kays M.D.   On: 08/18/2022 15:31    Summary and Recommendations: This is a pleasant 72 year old female with a history of hypertension, hyperlipidemia, insulin-dependent type 2 diabetes who underwent elective left sided reverse total shoulder arthroplasty today, and seen in consultation at the request of Dr. Devonne Doughty due to uncontrolled hypertension.  I suspect her uncontrolled hypertension is due to not taking her home medications this morning, in combination with some postoperative pain.  Uncontrolled hypertension-without worrisome signs. -Resume home occasions including Lopressor and Aldactone -Will plan to resume home Norvasc in the morning -Clonidine as needed SBP over 180 in the meantime, discussed with RN to recheck blood pressure at least 1 hour after administration of home Lopressor and Aldactone -Plan of care discussed in detail with the patient, her family member, and stepdown RN -Would consider IV  hydralazine as needed, or calcium channel blocker drip if remains severely uncontrolled despite the above  Other chronic home medications have been resumed appropriately per primary surgical team.  Thank you for involving [name] in the care of your patient.  [facility] will continue to follow along with you.    Code Status: Full Code  Time spent: 65 minutes  [name] Sharlette Dense  MD [facility] Pager [phone]  If 7PM-7AM, please contact night-coverage [email or web address] Password [facility]  08/18/2022, 5:59 PM

## 2022-08-18 NOTE — Care Plan (Signed)
Ortho Bundle Case Management Note  Patient Details  Name: [name] MRN: [id number] Date of Birth: 1951-02-03                  L Rev TSA on 08/18/22.  DCP: Home with home health aides.  DME: No needs.  PT: HEP   DME Arranged:  N/A DME Agency:       Additional Comments: Please contact me with any questions of if this plan should need to change.    Despina Pole, Case Manager  [facility]  [phone] 08/18/2022, 8:48 AM

## 2022-08-18 NOTE — Discharge Instructions (Signed)
Ice to the shoulder constantly.  Keep the incision covered and clean and dry for one week, then ok to get it wet in the shower. Please keep the incision open to air after one week  Do exercise as instructed several times per day.  DO NOT reach behind your back or push up out of a chair with the operative arm.  Use a sling while you are up and around for comfort, may remove while seated.  Keep pillow propped behind the operative elbow.  Follow up with Dr Ranell Patrick in two weeks in the office, call [phone] for appt.  Please call Dr Ranell Patrick (cell) [phone] with and questions or concerns

## 2022-08-18 NOTE — Op Note (Signed)
NAME: PAGEHennessey, Kotecki MEDICAL RECORD NO: [id number] ACCOUNT NO: [id number] DATE OF BIRTH: Aug 27, 1950 FACILITY: [facility] LOCATION: [facility] PHYSICIAN: Almedia Balls. Ranell Patrick, MD  Operative Report   DATE OF PROCEDURE: 08/18/2022  PREOPERATIVE DIAGNOSIS:  Left shoulder pain due to a combination of shoulder arthritis and rotator cuff tears.  POSTOPERATIVE DIAGNOSIS:  Left shoulder pain due to a combination of shoulder arthritis and rotator cuff tears.  PROCEDURE PERFORMED:  Left reverse shoulder replacement using DePuy Delta Xtend prosthesis with no subscap repair.  ATTENDING SURGEON:  Almedia Balls. Ranell Patrick, MD  ASSISTANT:  Konrad Felix Dixon, [place], who was scrubbed during the entire procedure, and necessary for satisfactory completion of surgery.  ANESTHESIA:  General anesthesia was used plus a supraclavicular block/regional anesthesia.  COUNTS:  Instrument counts correct.  COMPLICATIONS:  No complications.  ESTIMATED BLOOD LOSS:  Less than 100 mL.  FLUID REPLACEMENT:  1000 mL crystalloid.  INDICATIONS:  The patient is a 72 year old female with worsening left shoulder pain secondary to suspected rotator cuff insufficiency as well as shoulder arthritis.  The patient has significant synovitis and on clinical examination and limited function.   Given the failure of conservative management over the course of years, we discussed options and recommended reverse shoulder replacement in order to establish improved range of motion and pain relief.  Informed consent obtained.  DESCRIPTION OF PROCEDURE:  After an adequate level of anesthesia was achieved, the patient was positioned in the modified beach chair position.  Left shoulder correctly identified and sterile prep and drape performed.  Timeout called, verifying correct  patient, correct site, we entered the patient's shoulder using a standard deltopectoral approach, starting at the coracoid process extending down the anterior humerus.  Dissection down  through subcutaneous tissues using Bovie.  The cephalic vein was  identified and taken laterally with the deltoid, pectoralis taken medially.  Conjoined tendon identified and retracted medially.  Deep retractors placed.  Biceps was tenodesed in situ with 0 Vicryl figure-of-eight suture x2.  We then released the  subscapularis off the lesser tuberosity.  This was in poor condition with some tears.  We went ahead and tagged for protection of the axillary nerve.  Next, we released the inferior capsule extending the shoulder and delivering the humeral head out of  the wound.  The patient did have significant chondromalacia on the humeral head with cartilage thinning and quite a bit of wear on the glenoid face with most of the cartilage worn off the back of the glenoid.  We entered the proximal humerus with a 6 mm  reamer, reaming up to a size 8 mm.  We then used the 8 mm T-handle guide and resected the head at 20 degrees of retroversion using an oscillating saw, we removed excess osteophytes with a rongeur off the humerus.  We then subluxed the humerus  posteriorly.  We placed our deep retractors for the glenoid.  At this point, removed the biceps stump, the capsule, labrum and we were able to visualize the worn out glenoid.  We placed our deep retractors.  At this point, we removed the remaining  cartilage anteriorly with a Cobb elevator and a rongeur.  We next went ahead and placed our guide pin centered on this very small glenoid and then reamed down to subchondral bone with the metaglene reamer.  Once we had our reaming done, we drilled our  central peg hole impacted the HA coated press fit metaglene into position.  We placed a 42 screw inferiorly and then a  36 screw superiorly and these were the only 2 screws we could do because of the small size of the glenoid.  We had good purchase of the  screws.  We felt like the baseplate was secured with good bony support.  We then went ahead and placed a 38+0  standard glenosphere onto the baseplate.  We screwed that down with a screwdriver.  We went back to the humeral side and prepped for the one  left metaphysis, which we reamed for.  We then used the trial 8 stem with a 1 left metaphysis set on the 0 setting and placed in 20 degrees of retroversion.  Once we had that impacted in place, we went ahead and marked our version with the Bovie on the  bone and then we selected the 38+3 poly and placed on the humeral tray. We reduced the shoulder, had nice little pop as it reduced.  Appropriate tensioning and stability.  We removed all trial components from the humeral side irrigating thoroughly.  We  used impaction grafting technique with available bone graft from the humeral head and then impacted the [place] 8 stem with the one left HA coated metaphysis set on the 0 setting and impacted in 20 degrees of retroversion.  Once that stem was in place  and secured, we selected the real 38+3 poly placed on the humeral tray, reduced the shoulder again, nice little pop as it reduced.  Good stability throughout a full range of motion and no impingement.  We irrigated thoroughly, resected the subscap  remnant prior to closing deltopectoral closure with 0 Vicryl suture followed by 2-0 Vicryl for subcutaneous closure and 4-0 Monocryl for skin.  Steri-Strips applied followed by sterile dressing.  The patient tolerated surgery well.   PUS D: 08/18/2022 1:49:54 pm T: 08/18/2022 3:20:00 pm  JOB: [id number]/ [id number]

## 2022-08-18 NOTE — Transfer of Care (Signed)
Immediate Anesthesia Transfer of Care Note  Patient: [name]  Procedure(s) Performed: REVERSE SHOULDER ARTHROPLASTY (Left: Shoulder)  Patient Location: PACU  Anesthesia Type:GA combined with regional for post-op pain  Level of Consciousness: awake, alert , oriented, and patient cooperative  Airway & Oxygen Therapy: Patient Spontanous Breathing and Patient connected to face mask oxygen  Post-op Assessment: Report given to RN and Post -op Vital signs reviewed and stable  Post vital signs: Reviewed and stable  Last Vitals:  Vitals Value Taken Time  BP 158/80 08/18/22 1420  Temp    Pulse 81 08/18/22 1421  Resp 20 08/18/22 1421  SpO2 100 % 08/18/22 1421  Vitals shown include unvalidated device data.  Last Pain:  Vitals:   08/18/22 1050  TempSrc:   PainSc: 0-No pain      Patients Stated Pain Goal: 3 (08/18/22 0819)  Complications: No notable events documented.

## 2022-08-18 NOTE — Anesthesia Procedure Notes (Signed)
Procedure Name: Intubation Date/Time: 08/18/2022 12:15 PM  Performed by: Johnette Abraham, CRNAPre-anesthesia Checklist: Patient identified, Emergency Drugs available, Suction available and Patient being monitored Patient Re-evaluated:Patient Re-evaluated prior to induction Oxygen Delivery Method: Circle System Utilized Preoxygenation: Pre-oxygenation with 100% oxygen Induction Type: IV induction Ventilation: Mask ventilation without difficulty Laryngoscope Size: Mac and 3 Grade View: Grade I Tube type: Oral Tube size: 7.0 mm Number of attempts: 1 Airway Equipment and Method: Stylet and Oral airway Placement Confirmation: ETT inserted through vocal cords under direct vision, positive ETCO2 and breath sounds checked- equal and bilateral Secured at: 21 cm Tube secured with: Tape Dental Injury: Teeth and Oropharynx as per pre-operative assessment

## 2022-08-18 NOTE — Anesthesia Postprocedure Evaluation (Signed)
Anesthesia Post Note  Patient: [name]  Procedure(s) Performed: REVERSE SHOULDER ARTHROPLASTY (Left: Shoulder)     Patient location during evaluation: PACU Anesthesia Type: Regional and General Level of consciousness: awake and alert Pain management: pain level controlled Vital Signs Assessment: post-procedure vital signs reviewed and stable Respiratory status: spontaneous breathing, nonlabored ventilation, respiratory function stable and patient connected to nasal cannula oxygen Cardiovascular status: blood pressure returned to baseline and stable Postop Assessment: no apparent nausea or vomiting Anesthetic complications: no  No notable events documented.  Last Vitals:  Vitals:   08/18/22 1545 08/18/22 1600  BP: (!) 145/76 (!) 195/115  Pulse: 78 81  Resp: 20 17  Temp: (!) 36.3 C 36.6 C  SpO2: 100% 100%    Last Pain:  Vitals:   08/18/22 1600  TempSrc: Oral  PainSc:                  [name]

## 2022-08-18 NOTE — Interval H&P Note (Signed)
History and Physical Interval Note:  08/18/2022 9:35 AM  [name]  has presented today for surgery, with the diagnosis of Pain of left shoulder joint.  The various methods of treatment have been discussed with the patient and family. After consideration of risks, benefits and other options for treatment, the patient has consented to  Procedure(s) with comments: REVERSE SHOULDER ARTHROPLASTY (Left) - choice with interscalene, general as a surgical intervention.  The patient's history has been reviewed, patient examined, no change in status, stable for surgery.  I have reviewed the patient's chart and labs.  Questions were answered to the patient's satisfaction.     Verlee Rossetti

## 2022-08-18 NOTE — Brief Op Note (Signed)
08/18/2022  1:43 PM  PATIENT:  [name]  72 y.o. female  PRE-OPERATIVE DIAGNOSIS:  Pain of left shoulder joint, shoulder OA, rotator cuff tear  POST-OPERATIVE DIAGNOSIS:  Pain of left shoulder joint, shoulder OA, rotator cuff tear  PROCEDURE:  Procedure(s) with comments: REVERSE SHOULDER ARTHROPLASTY (Left) - choice with interscalene, general DePuy delta Xtend with NO subscap repair  SURGEON:  Surgeon(s) and Role:    Beverely Low, MD - Primary  PHYSICIAN ASSISTANT:   ASSISTANTS: Thea Gist, PA-C   ANESTHESIA:   regional and general  EBL:  100 mL   BLOOD ADMINISTERED:none  DRAINS: none   LOCAL MEDICATIONS USED:  MARCAINE     SPECIMEN:  No Specimen  DISPOSITION OF SPECIMEN:  N/A  COUNTS:  YES  TOURNIQUET:  * No tourniquets in log *  DICTATION: .Other Dictation: Dictation Number [id number]  PLAN OF CARE: Admit for overnight observation  PATIENT DISPOSITION:  PACU - hemodynamically stable.   Delay start of Pharmacological VTE agent (>24hrs) due to surgical blood loss or risk of bleeding: no

## 2022-08-19 ENCOUNTER — Other Ambulatory Visit: Payer: Self-pay

## 2022-08-19 DIAGNOSIS — Z96612 Presence of left artificial shoulder joint: Secondary | ICD-10-CM | POA: Diagnosis not present

## 2022-08-19 LAB — GLUCOSE, CAPILLARY
Glucose-Capillary: 189 mg/dL — ABNORMAL HIGH (ref 70–99)
Glucose-Capillary: 86 mg/dL (ref 70–99)
Glucose-Capillary: 130 mg/dL — ABNORMAL HIGH (ref 70–99)
Glucose-Capillary: 129 mg/dL — ABNORMAL HIGH (ref 70–99)

## 2022-08-19 LAB — BASIC METABOLIC PANEL
Anion gap: 12 (ref 5–15)
BUN: 16 mg/dL (ref 8–23)
CO2: 25 mmol/L (ref 22–32)
Calcium: 8.4 mg/dL — ABNORMAL LOW (ref 8.9–10.3)
Chloride: 97 mmol/L — ABNORMAL LOW (ref 98–111)
Creatinine, Ser: 0.96 mg/dL (ref 0.44–1.00)
GFR, Estimated: >60 mL/min (ref >60)
Glucose, Bld: 189 mg/dL — ABNORMAL HIGH (ref 70–99)
Potassium: 4.6 mmol/L (ref 3.5–5.1)
Sodium: 134 mmol/L — ABNORMAL LOW (ref 135–145)

## 2022-08-19 LAB — HEMOGLOBIN AND HEMATOCRIT, BLOOD
HCT: 38 % (ref 36.0–46.0)
Hemoglobin: 10.9 g/dL — ABNORMAL LOW (ref 12.0–15.0)

## 2022-08-19 MED ORDER — HYDRALAZINE HCL 50 MG PO TABS
25.0000 mg | ORAL_TABLET | Freq: Four times a day (QID) | ORAL | Status: DC | PRN
  Administered 2022-08-19: 25 mg via ORAL
  Filled 2022-08-19: qty 1

## 2022-08-19 MED ORDER — CYCLOSPORINE 0.05 % OP EMUL
1.0000 [drp] | Freq: Two times a day (BID) | OPHTHALMIC | Status: DC
  Administered 2022-08-19 – 2022-08-25 (×13): 1 [drp] via OPHTHALMIC
  Filled 2022-08-19 (×14): qty 30

## 2022-08-19 NOTE — Progress Notes (Signed)
Subjective: 1 Day Post-Op Procedure(s) (LRB): REVERSE SHOULDER ARTHROPLASTY (Left) Patient reports pain as moderate.   BP's still significantly elevated  Objective: Vital signs in last 24 hours: Temp:  [96.6 F (35.9 C)-98.7 F (37.1 C)] 98.7 F (37.1 C) (06/08 0400) Pulse Rate:  [64-110] 84 (06/08 0700) Resp:  [15-28] 16 (06/08 0700) BP: (123-245)/(43-115) 164/65 (06/08 0700) SpO2:  [78 %-100 %] 98 % (06/08 0700) Weight:  [75.8 kg-78.8 kg] 78.8 kg (06/07 1600)  Intake/Output from previous day: 06/07 0701 - 06/08 0700 In: 1620 [P.O.:720; I.V.:700; IV Piggyback:200] Out: 1400 [Urine:1300; Blood:100] Intake/Output this shift: No intake/output data recorded.  Recent Labs    08/18/22 1435 08/19/22 0316  HGB 10.9* 10.9*   Recent Labs    08/18/22 1435 08/19/22 0316  WBC 7.9  --   RBC 4.74  --   HCT 38.7 38.0  PLT 230  --    Recent Labs    08/18/22 1435 08/19/22 0316  NA 138 134*  K 4.0 4.6  CL 106 97*  CO2 22 25  BUN 14 16  CREATININE 0.80 0.96  GLUCOSE 233* 189*  CALCIUM 8.6* 8.4*   No results for input(s): "LABPT", "INR" in the last 72 hours.  Dressing C/D/I Wiggles fingers Pulses intact   Assessment/Plan: 1 Day Post-Op Procedure(s) (LRB): REVERSE SHOULDER ARTHROPLASTY (Left) Up with therapy Appreciate hospitalist assistance with hypertension      Ollen Gross 08/19/2022, 7:35 AM

## 2022-08-19 NOTE — Evaluation (Signed)
Occupational Therapy Evaluation Patient Details Name: [name] MRN: [id number] DOB: [birth year] Today's Date: 08/19/2022   History of Present Illness Patient is a 72 year old female who presented on 6/7 for elective left shoulder arthroplasty. Post op patient was having difficulty with waking up thus transitioned to SDU. Patient also noted to have HTN. PMH: anxiety, brain tumor, cancer, glaucoma, major depression, narcolepsy, seizures, stroke with L side weakness, back surgery, craniotomy, carpal tunnel release L side,   Clinical Impression   Patient is a 72 year old female who was admitted for above. Patient was living at home alone prior level with help from caregiver daily. Patient was max A for sling management, UB dressing, toileting hygiene and min A with HHA to transfer from EOB to recliner and back. Patient was educated on sitting up OOB with patient declining to participate in this task. Patient was noted to have decreased functional activity tolerance, decreased endurance, decreased standing balance, decreased knowledge of shoulder precautions or how to maintain them, decreased safety awareness, decreased caregiver training, and decreased knowledge of AD/AE impacting participation in ADLs. Patient would continue to benefit from skilled OT services at this time while admitted and after d/c to address noted deficits in order to improve overall safety and independence in ADLs.        Recommendations for follow up therapy are one component of a multi-disciplinary discharge planning process, led by the attending physician.  Recommendations may be updated based on patient status, additional functional criteria and insurance authorization.   Assistance Recommended at Discharge Frequent or constant Supervision/Assistance  Patient can return home with the following A lot of help with bathing/dressing/bathroom;A lot of help with walking and/or transfers;Assistance with cooking/housework;Direct  supervision/assist for medications management;Assist for transportation;Help with stairs or ramp for entrance;Direct supervision/assist for financial management    Functional Status Assessment  Patient has had a recent decline in their functional status and demonstrates the ability to make significant improvements in function in a reasonable and predictable amount of time.  Equipment Recommendations  None recommended by OT       Precautions / Restrictions Precautions Precautions: Shoulder Type of Shoulder Precautions: no ROM shoulder, ok for hand wrist and elbow AROM, Shoulder Interventions: Shoulder sling/immobilizer;At all times;Off for dressing/bathing/exercises Precaution Booklet Issued: Yes (comment) (handouts) Restrictions Weight Bearing Restrictions: Yes LUE Weight Bearing: Non weight bearing      Mobility Bed Mobility Overal bed mobility: Needs Assistance Bed Mobility: Supine to Sit, Sit to Supine     Supine to sit: Mod assist Sit to supine: Mod assist   General bed mobility comments: with physical A to get BLE back into bed. physical a to get in and out of bed with trunk control to prevent using LUE            Balance Overall balance assessment: Mild deficits observed, not formally tested               ADL either performed or assessed with clinical judgement   ADL Overall ADL's : Needs assistance/impaired Eating/Feeding: Set up Eating/Feeding Details (indicate cue type and reason): patient declined to sit up in recliner requesting to get back in bed to eat lunch. nurse and therapist present in room to encourage patient to sit up out of bed.     Upper Body Bathing: Sitting;Maximal assistance   Lower Body Bathing: Maximal assistance;Sit to/from stand;Sitting/lateral leans   Upper Body Dressing : Sitting;Maximal assistance Upper Body Dressing Details (indicate cue type and reason): patient  max A to adjust sling in place with noted to have wrist over the  top of the sling straps which were still in place. removed and replaced appropirately. patient was max A to change hospital gown sitting EOB. Lower Body Dressing: Bed level;Total assistance   Toilet Transfer: Min guard;Ambulation Toilet Transfer Details (indicate cue type and reason): with HHA. patient typically uses rollator for functional mobiltiy at baseline. rollator present in room Toileting- Clothing Manipulation and Hygiene: Maximal assistance;Sit to/from stand               Vision   Additional Comments: difficult to assess, patient reported :" i keep my eyes closed most of the time"            Pertinent Vitals/Pain Pain Assessment Pain Assessment: Faces Faces Pain Scale: Hurts even more Pain Location: "all over" Pain Descriptors / Indicators: Grimacing, Discomfort Pain Intervention(s): Limited activity within patient's tolerance, Monitored during session, RN gave pain meds during session     Hand Dominance Right   Extremity/Trunk Assessment Upper Extremity Assessment Upper Extremity Assessment: LUE deficits/detail LUE Deficits / Details: shoudler surgery with restritions in place. painful to touch wrist. LUE: Unable to fully assess due to pain   Lower Extremity Assessment Lower Extremity Assessment: Defer to PT evaluation   Cervical / Trunk Assessment Cervical / Trunk Assessment: Kyphotic   Communication Communication Communication: No difficulties   Cognition Arousal/Alertness: Lethargic Behavior During Therapy: Flat affect Overall Cognitive Status: Difficult to assess     General Comments: patient was lethargic during session, words were noted to be slightly slurred at times.     General Comments  patient noted to have SOB with O2 noted to be 100% on RA.       Shoulder Instructions Shoulder Instructions Donning/doffing shirt without moving shoulder: Maximal assistance Method for sponge bathing under operated UE: Maximal assistance Donning/doffing  sling/immobilizer: Maximal assistance Correct positioning of sling/immobilizer: Maximal assistance Sling wearing schedule (on at all times/off for ADL's): Maximal assistance Proper positioning of operated UE when showering: Maximal assistance Positioning of UE while sleeping: Maximal assistance    Home Living Family/patient expects to be discharged to:: Private residence Living Arrangements: Alone Available Help at Discharge: Personal care attendant;Available PRN/intermittently;Family;Friend(s) Type of Home: Apartment Home Access: Level entry     Home Layout: One level     Bathroom Shower/Tub: Producer, television/film/video: Handicapped height     Home Equipment: Agricultural consultant (2 wheels);Cane - single point;BSC/3in1;Hand held shower head;Grab bars - toilet;Grab bars - tub/shower;Cane - quad;Rollator (4 wheels);Shower seat - built in;Wheelchair - manual;Hospital bed          Prior Functioning/Environment Prior Level of Function : Needs assist       Physical Assist : Mobility (physical);ADLs (physical) Mobility (physical): Bed mobility;Transfers;Gait;Stairs ADLs (physical): IADLs Mobility Comments: household ambulator using Rollator most of time, sometimes single point cane, uses electric carts in grocery stores ADLs Comments: home aides 8 hours/day x 5 days/week; two different caregivers on weekend days.        OT Problem List: Decreased activity tolerance;Impaired balance (sitting and/or standing);Decreased coordination;Decreased safety awareness;Pain;Decreased knowledge of precautions;Impaired UE functional use;Decreased knowledge of use of DME or AE      OT Treatment/Interventions: Self-care/ADL training;Energy conservation;Therapeutic exercise;DME and/or AE instruction;Therapeutic activities;Patient/family education;Balance training    OT Goals(Current goals can be found in the care plan section) Acute Rehab OT Goals Patient Stated Goal: to go home OT Goal  Formulation: Patient unable to participate in goal  setting Time For Goal Achievement: 09/02/22 Potential to Achieve Goals: Fair  OT Frequency: Min 1X/week       AM-PAC OT "6 Clicks" Daily Activity     Outcome Measure Help from another person eating meals?: A Little Help from another person taking care of personal grooming?: A Little Help from another person toileting, which includes using toliet, bedpan, or urinal?: A Lot Help from another person bathing (including washing, rinsing, drying)?: A Lot Help from another person to put on and taking off regular upper body clothing?: A Little Help from another person to put on and taking off regular lower body clothing?: A Lot 6 Click Score: 15   End of Session Equipment Utilized During Treatment: Other (comment) (sling) Nurse Communication: Other (comment) (nurse in room for portion of session)  Activity Tolerance: Patient limited by pain Patient left: in bed;with call bell/phone within reach;with bed alarm set;with nursing/sitter in room (nurse in room)  OT Visit Diagnosis: Unsteadiness on feet (R26.81);Other abnormalities of gait and mobility (R26.89);Pain                Time: 8413-2440 OT Time Calculation (min): 22 min Charges:  OT General Charges $OT Visit: 1 Visit OT Evaluation $OT Eval Moderate Complexity: 1 Mod  Wang Granada OTR/L, MS Acute Rehabilitation Department Office# [phone]   Selinda Flavin 08/19/2022, 4:41 PM

## 2022-08-19 NOTE — Progress Notes (Signed)
[facility]  PROGRESS NOTE  Leela Crackel Bellisario WUJ:[id number] DOB: 23-Feb-1951 DOA: 08/18/2022 PCP: Kerri Perches, MD   Brief HPI:   72 year old female with medical history of hypertension, bipolar disorder, diabetes mellitus type 2 who was admitted to hospital after elective left shoulder replacement with Dr. Devonne Doughty.  Patient blood pressure was significantly elevated, [facility] was consulted for management of hypertension. At home patient takes Norvasc, Lopressor, Aldactone, telmisartan for blood pressure.    Assessment/Plan:    Hypertensive urgency -Resolved after starting home medications -Will discontinue clonidine and start hydralazine as needed  Hypertension -Continue amlodipine 10 mg daily, irbesartan 75 mg daily, metoprolol 50 mg p.o. twice daily, Aldactone 50 mg daily  Diabetes mellitus type 2 -Continue sliding scale insulin NovoLog -CBG well-controlled    Medications     acidophilus  2 capsule Oral TID   amLODipine  10 mg Oral Daily   ascorbic acid  500 mg Oral Daily   aspirin EC  81 mg Oral Q breakfast   buPROPion  150 mg Oral Daily   calcium-vitamin D  1 tablet Oral Q breakfast   Chlorhexidine Gluconate Cloth  6 each Topical Daily   cholecalciferol  1,000 Units Oral Daily   cycloSPORINE  1 drop Both Eyes BID   docusate sodium  100 mg Oral BID   doxycycline  100 mg Oral BID   ezetimibe  10 mg Oral Daily   insulin aspart  0-15 Units Subcutaneous TID WC   insulin aspart  0-5 Units Subcutaneous QHS   insulin aspart  4 Units Subcutaneous TID WC   insulin glargine-yfgn  20 Units Subcutaneous QHS   irbesartan  75 mg Oral Daily   lamoTRIgine  100 mg Oral BID   [START ON 08/20/2022] levothyroxine  25 mcg Oral Once per day on Sun   levothyroxine  50 mcg Oral Once per day on Mon Tue Wed Thu Fri Sat   LORazepam  0.5 mg Oral BID   metoprolol tartrate  50 mg Oral BID   mirabegron ER  25 mg Oral Daily   montelukast  10 mg Oral Daily   multivitamin with  minerals  1 tablet Oral Daily   omega-3 acid ethyl esters  1 g Oral Daily   pantoprazole  40 mg Oral Daily   potassium chloride  10 mEq Oral BID   pregabalin  75 mg Oral Daily   risperiDONE  0.5 mg Oral QHS   rosuvastatin  5 mg Oral QHS   sertraline  100 mg Oral Daily   spironolactone  50 mg Oral Daily   traZODone  150 mg Oral QHS   vitamin E  400 Units Oral Daily     Data Reviewed:   CBG:  Recent Labs  Lab 08/18/22 1416 08/18/22 1630 08/18/22 2211 08/19/22 0746 08/19/22 1211  GLUCAP 222* 196* 252* 189* 86    SpO2: 97 % O2 Flow Rate (L/min): 3 L/min    Vitals:   08/19/22 0700 08/19/22 0800 08/19/22 0939 08/19/22 1100  BP: (!) 164/65 131/68 (!) 132/46 116/61  Pulse: 84 88 79 66  Resp: 16 (!) 21  18  Temp:  98.5 F (36.9 C)  98.7 F (37.1 C)  TempSrc:  Oral  Oral  SpO2: 98% 97%  97%  Weight:      Height:          Data Reviewed:  Basic Metabolic Panel: Recent Labs  Lab 08/18/22 1435 08/19/22 0316  NA 138 134*  K 4.0 4.6  CL 106 97*  CO2 22 25  GLUCOSE 233* 189*  BUN 14 16  CREATININE 0.80 0.96  CALCIUM 8.6* 8.4*    CBC: Recent Labs  Lab 08/15/22 1344 08/18/22 1435 08/19/22 0316  WBC 6.2 7.9  --   NEUTROABS  --  5.8  --   HGB 10.5* 10.9* 10.9*  HCT 36.4 38.7 38.0  MCV 80.5 81.6  --   PLT 233 230  --     LFT No results for input(s): "AST", "ALT", "ALKPHOS", "BILITOT", "PROT", "ALBUMIN" in the last 168 hours.   Antibiotics: Anti-infectives (From admission, onward)    Start     Dose/Rate Route Frequency Ordered Stop   08/18/22 2200  doxycycline (VIBRA-TABS) tablet 100 mg        100 mg Oral 2 times daily 08/18/22 1603     08/18/22 2200  vancomycin (VANCOCIN) IVPB 1000 mg/200 mL premix       Note to Pharmacy: Can you assist with dosing for post op antibiotic prophylaxis   1,000 mg 200 mL/hr over 60 Minutes Intravenous Every 24 hours 08/18/22 1607 08/18/22 2230   08/18/22 0730  levofloxacin (LEVAQUIN) IVPB 500 mg        500 mg 100  mL/hr over 60 Minutes Intravenous On call to O.R. 08/18/22 0725 08/18/22 1243   08/18/22 0730  vancomycin (VANCOCIN) IVPB 1000 mg/200 mL premix        1,000 mg 200 mL/hr over 60 Minutes Intravenous On call to O.R. 08/18/22 0725 08/18/22 0910           Subjective   Patient seen, drowsy this morning.  Blood pressure is better controlled after starting home medications.   Objective    Physical Examination:   General-appears in no acute distress Heart-S1-S2, regular, no murmur auscultated Lungs-clear to auscultation bilaterally, no wheezing or crackles auscultated Abdomen-soft, nontender, no organomegaly Extremities-left shoulder in dressing Neuro-alert, oriented x3, no focal deficit noted   Status is: Inpatient:             [name]   [facility] If 7PM-7AM, please contact night-coverage at [email or web address], Office  [phone]   08/19/2022, 3:30 PM  LOS: 1 day

## 2022-08-20 DIAGNOSIS — Z96612 Presence of left artificial shoulder joint: Secondary | ICD-10-CM | POA: Diagnosis not present

## 2022-08-20 DIAGNOSIS — I10 Essential (primary) hypertension: Secondary | ICD-10-CM

## 2022-08-20 LAB — GLUCOSE, CAPILLARY
Glucose-Capillary: 113 mg/dL — ABNORMAL HIGH (ref 70–99)
Glucose-Capillary: 96 mg/dL (ref 70–99)
Glucose-Capillary: 116 mg/dL — ABNORMAL HIGH (ref 70–99)
Glucose-Capillary: 115 mg/dL — ABNORMAL HIGH (ref 70–99)

## 2022-08-20 NOTE — Progress Notes (Signed)
Occupational Therapy Treatment Patient Details Name: STEPHANNIE BRONER MRN: [id number] DOB: 1950-04-19 Today's Date: 08/20/2022   History of present illness Patient is a 72 year old female who presented on 6/7 for elective left shoulder arthroplasty. Post op patient was having difficulty with waking up thus transitioned to SDU. Patient also noted to have HTN. PMH: anxiety, brain tumor, cancer, glaucoma, major depression, narcolepsy, seizures, stroke with L side weakness, back surgery, craniotomy, carpal tunnel release L side,   OT comments  Patients niece and son were present today for scheduled caregiver training. Patient remains increasingly lethargic with patient continuously falling asleep during session. Session was mainly verbal and simulated tasks due patients level of alertness in bed. Patient's niece reported that they could get increased caregiver support in next level of care if needed. Patients son and niece expressed desire to speak with nurse regarding medications and CPAP, this was passed along to nurse. Patient needs continued training with patient move involved in session prior to d/c from hospital. Patient's discharge plan remains appropriate at this time. OT will continue to follow acutely.     Recommendations for follow up therapy are one component of a multi-disciplinary discharge planning process, led by the attending physician.  Recommendations may be updated based on patient status, additional functional criteria and insurance authorization.    Assistance Recommended at Discharge Frequent or constant Supervision/Assistance  Patient can return home with the following  A lot of help with bathing/dressing/bathroom;A lot of help with walking and/or transfers;Assistance with cooking/housework;Direct supervision/assist for medications management;Assist for transportation;Help with stairs or ramp for entrance;Direct supervision/assist for financial management   Equipment Recommendations   None recommended by OT       Precautions / Restrictions Precautions Precautions: Shoulder Type of Shoulder Precautions: no ROM shoulder, ok for hand wrist and elbow AROM, Shoulder Interventions: Shoulder sling/immobilizer;At all times;Off for dressing/bathing/exercises Precaution Booklet Issued: Yes (comment) (handouts provided to son and niece) Restrictions Weight Bearing Restrictions: Yes LUE Weight Bearing: Non weight bearing       Mobility Bed Mobility Overal bed mobility: Needs Assistance Bed Mobility: Supine to Sit     Supine to sit: Min assist     General bed mobility comments: with HOB raised.          Balance Overall balance assessment: Needs assistance Sitting-balance support: Feet supported, Single extremity supported Sitting balance-Leahy Scale: Poor Sitting balance - Comments: leaning posterioly and to R side with fatigue needed physical A to maintain sitting balance.             ADL either performed or assessed with clinical judgement      Cognition Arousal/Alertness: Lethargic Behavior During Therapy: Flat affect Overall Cognitive Status: Difficult to assess         General Comments: patients son and niece were present in room during this session for education portion of session to increase carryover with shoulder restrictions in next level of care.           Shoulder Instructions Shoulder Instructions Donning/doffing shirt without moving shoulder: Caregiver independent with task (verbal education with niece on techniques with handout provided) Method for sponge bathing under operated UE: Caregiver independent with task (verbal education with niece with simulation of task on therapist.) ROM for elbow, wrist and digits of operated UE: Caregiver independent with task (verbalized understanding with education) Sling wearing schedule (on at all times/off for ADL's): Caregiver independent with task (verbalized understanding with education) Proper  positioning of operated UE when showering: Caregiver independent with  task (verbalized understanding with education) Positioning of UE while sleeping: Caregiver independent with task (verbalized understanding with education)          Pertinent Vitals/ Pain       Pain Assessment Pain Assessment: Faces Faces Pain Scale: Hurts a little bit Pain Location: resting in bed Pain Descriptors / Indicators: Discomfort Pain Intervention(s): Limited activity within patient's tolerance, Monitored during session         Frequency  Min 1X/week        Progress Toward Goals  OT Goals(current goals can now be found in the care plan section)  Progress towards OT goals: Progressing toward goals (education with caregivers but patient continues to have increased fatigue during session)     Plan Discharge plan remains appropriate       AM-PAC OT "6 Clicks" Daily Activity     Outcome Measure   Help from another person eating meals?: A Little Help from another person taking care of personal grooming?: A Little Help from another person toileting, which includes using toliet, bedpan, or urinal?: A Lot Help from another person bathing (including washing, rinsing, drying)?: A Lot Help from another person to put on and taking off regular upper body clothing?: A Little Help from another person to put on and taking off regular lower body clothing?: A Lot 6 Click Score: 15    End of Session Equipment Utilized During Treatment: Other (comment) (sling)  OT Visit Diagnosis: Unsteadiness on feet (R26.81);Other abnormalities of gait and mobility (R26.89);Pain Pain - Right/Left: Left Pain - part of body: Shoulder   Activity Tolerance Patient limited by fatigue;Patient limited by lethargy   Patient Left in bed;with call bell/phone within reach;with family/visitor present   Nurse Communication Other (comment) (brought patients family concerns over CPAP use, medications patient is or is not taking  and  lethargy to nurse. nurse to further discuss with family)        Time: 1610-9604 OT Time Calculation (min): 19 min  Charges: OT General Charges $OT Visit: 1 Visit OT Treatments $Therapeutic Activity: 8-22 mins  Rosalio Loud, MS Acute Rehabilitation Department Office# [phone]   Selinda Flavin 08/20/2022, 4:15 PM

## 2022-08-20 NOTE — Plan of Care (Signed)
  Problem: Education: Goal: Ability to describe self-care measures that may prevent or decrease complications (Diabetes Survival Skills Education) will improve Outcome: Progressing Goal: Individualized Educational Video(s) Outcome: Progressing   Problem: Coping: Goal: Ability to adjust to condition or change in health will improve Outcome: Progressing   Problem: Fluid Volume: Goal: Ability to maintain a balanced intake and output will improve Outcome: Progressing   Problem: Health Behavior/Discharge Planning: Goal: Ability to identify and utilize available resources and services will improve Outcome: Progressing Goal: Ability to manage health-related needs will improve Outcome: Progressing   Problem: Metabolic: Goal: Ability to maintain appropriate glucose levels will improve Outcome: Progressing   Problem: Nutritional: Goal: Maintenance of adequate nutrition will improve Outcome: Progressing Goal: Progress toward achieving an optimal weight will improve Outcome: Progressing   Problem: Skin Integrity: Goal: Risk for impaired skin integrity will decrease Outcome: Progressing   Problem: Tissue Perfusion: Goal: Adequacy of tissue perfusion will improve Outcome: Progressing   Problem: Education: Goal: Knowledge of the prescribed therapeutic regimen will improve Outcome: Progressing Goal: Understanding of activity limitations/precautions following surgery will improve Outcome: Progressing Goal: Individualized Educational Video(s) Outcome: Progressing   Problem: Activity: Goal: Ability to tolerate increased activity will improve Outcome: Progressing   Problem: Pain Management: Goal: Pain level will decrease with appropriate interventions Outcome: Progressing   Problem: Education: Goal: Knowledge of General Education information will improve Description: Including pain rating scale, medication(s)/side effects and non-pharmacologic comfort measures Outcome:  Progressing   Problem: Health Behavior/Discharge Planning: Goal: Ability to manage health-related needs will improve Outcome: Progressing   Problem: Clinical Measurements: Goal: Ability to maintain clinical measurements within normal limits will improve Outcome: Progressing Goal: Will remain free from infection Outcome: Progressing Goal: Diagnostic test results will improve Outcome: Progressing Goal: Respiratory complications will improve Outcome: Progressing Goal: Cardiovascular complication will be avoided Outcome: Progressing   Problem: Activity: Goal: Risk for activity intolerance will decrease Outcome: Progressing   Problem: Nutrition: Goal: Adequate nutrition will be maintained Outcome: Progressing   Problem: Coping: Goal: Level of anxiety will decrease Outcome: Progressing   Problem: Elimination: Goal: Will not experience complications related to bowel motility Outcome: Progressing Goal: Will not experience complications related to urinary retention Outcome: Progressing   Problem: Pain Managment: Goal: General experience of comfort will improve Outcome: Progressing   Problem: Safety: Goal: Ability to remain free from injury will improve Outcome: Progressing   Problem: Skin Integrity: Goal: Risk for impaired skin integrity will decrease Outcome: Progressing   

## 2022-08-20 NOTE — Progress Notes (Signed)
Subjective: 2 Days Post-Op Procedure(s) (LRB): REVERSE SHOULDER ARTHROPLASTY (Left) Patient reports pain as improved.   BP's still significantly elevated  Objective: Vital signs in last 24 hours: Temp:  [98.2 F (36.8 C)-100.1 F (37.8 C)] 99.9 F (37.7 C) (06/09 0635) Pulse Rate:  [66-86] 80 (06/09 0635) Resp:  [18-20] 18 (06/09 0635) BP: (116-186)/(46-75) 131/48 (06/09 0635) SpO2:  [95 %-98 %] 95 % (06/09 0635)  Intake/Output from previous day: 06/08 0701 - 06/09 0700 In: -  Out: 800 [Urine:800] Intake/Output this shift: No intake/output data recorded.  Recent Labs    08/18/22 1435 08/19/22 0316  HGB 10.9* 10.9*    Recent Labs    08/18/22 1435 08/19/22 0316  WBC 7.9  --   RBC 4.74  --   HCT 38.7 38.0  PLT 230  --     Recent Labs    08/18/22 1435 08/19/22 0316  NA 138 134*  K 4.0 4.6  CL 106 97*  CO2 22 25  BUN 14 16  CREATININE 0.80 0.96  GLUCOSE 233* 189*  CALCIUM 8.6* 8.4*    No results for input(s): "LABPT", "INR" in the last 72 hours.  Dressing C/D/I Wiggles fingers Pulses intact   Assessment/Plan: 2 Days Post-Op Procedure(s) (LRB): REVERSE SHOULDER ARTHROPLASTY (Left) Up with therapy Appreciate hospitalist assistance    Netta Cedars 08/20/2022, 9:23 AM

## 2022-08-20 NOTE — Progress Notes (Signed)
Occupational Therapy Treatment Patient Details Name: [name] MRN: [id number] DOB: [birth year] Today's Date: 08/20/2022   History of present illness Patient is a 72 year old female who presented on 6/7 for elective left shoulder arthroplasty. Post op patient was having difficulty with waking up thus transitioned to SDU. Patient also noted to have HTN. PMH: anxiety, brain tumor, cancer, glaucoma, major depression, narcolepsy, seizures, stroke with L side weakness, back surgery, craniotomy, carpal tunnel release L side,   OT comments  Patient was noted to remain lethargic with patient falling asleep at times while sitting EOB. Niece who is POA called hospital room during session. Patients niece reported that patient gets "spells" where she is more lethargic and we needed to just wait for it to pass. Patient agreeable to sit up in recliner after ADLs sitting EOB. Patient needed physical A for sling management with ROM and WB restrictions re written on whiteboard this AM. Scheduled training session on shoulder precautions for 2 pm today with family. Patient's discharge plan remains appropriate at this time. OT will continue to follow acutely.     Recommendations for follow up therapy are one component of a multi-disciplinary discharge planning process, led by the attending physician.  Recommendations may be updated based on patient status, additional functional criteria and insurance authorization.    Assistance Recommended at Discharge Frequent or constant Supervision/Assistance  Patient can return home with the following  A lot of help with bathing/dressing/bathroom;A lot of help with walking and/or transfers;Assistance with cooking/housework;Direct supervision/assist for medications management;Assist for transportation;Help with stairs or ramp for entrance;Direct supervision/assist for financial management   Equipment Recommendations  None recommended by OT       Precautions / Restrictions  Precautions Precautions: Shoulder Type of Shoulder Precautions: no ROM shoulder, ok for hand wrist and elbow AROM, Shoulder Interventions: Shoulder sling/immobilizer;At all times;Off for dressing/bathing/exercises Precaution Booklet Issued: Yes (comment) Restrictions Weight Bearing Restrictions: Yes LUE Weight Bearing: Non weight bearing       Mobility Bed Mobility Overal bed mobility: Needs Assistance Bed Mobility: Supine to Sit     Supine to sit: Min assist     General bed mobility comments: with HOB raised.            Balance Overall balance assessment: Needs assistance Sitting-balance support: Feet supported, Single extremity supported Sitting balance-Leahy Scale: Poor Sitting balance - Comments: leaning posterioly and to R side with fatigue needed physical A to maintain sitting balance.         ADL either performed or assessed with clinical judgement   ADL Overall ADL's : Needs assistance/impaired         Upper Body Bathing: Sitting;Maximal assistance Upper Body Bathing Details (indicate cue type and reason): with education on proper positioning of LUE, patient needed physical A to maintain proper positioning. Lower Body Bathing: Maximal assistance;Sit to/from stand;Sitting/lateral leans Lower Body Bathing Details (indicate cue type and reason): for hygiene in standing.         Toilet Transfer: Min Acupuncturist Details (indicate cue type and reason): with HHA and increased cues. patient turning to recliner prior to therapist being able to manage clothing even with cues. Toileting- Clothing Manipulation and Hygiene: Maximal assistance;Sit to/from stand         General ADL Comments: patients niece called during session to speak with patient on hosptial phone, session for training on patients restrictions was intiated for 2pm on this date.      Cognition Arousal/Alertness: Lethargic Behavior During Therapy: Flat  affect Overall Cognitive Status: Difficult to assess                                 General Comments: patient was able to be alert enough to answer questions but noted to fall alseep inbetwen cues at times. niece called during session and reported patient gets "these spells" sometimes.                   Pertinent Vitals/ Pain       Pain Assessment Pain Assessment: Faces Faces Pain Scale: Hurts whole lot Pain Location: shoulder with light touch Pain Descriptors / Indicators: Grimacing, Discomfort, Operative site guarding Pain Intervention(s): Limited activity within patient's tolerance, Monitored during session, Premedicated before session  Home Living Family/patient expects to be discharged to:: Private residence Living Arrangements: Children                    Frequency  Min 1X/week        Progress Toward Goals  OT Goals(current goals can now be found in the care plan section)  Progress towards OT goals: Progressing toward goals     Plan Discharge plan remains appropriate       AM-PAC OT "6 Clicks" Daily Activity     Outcome Measure   Help from another person eating meals?: A Little Help from another person taking care of personal grooming?: A Little Help from another person toileting, which includes using toliet, bedpan, or urinal?: A Lot Help from another person bathing (including washing, rinsing, drying)?: A Lot Help from another person to put on and taking off regular upper body clothing?: A Little Help from another person to put on and taking off regular lower body clothing?: A Lot 6 Click Score: 15    End of Session Equipment Utilized During Treatment: Other (comment) (sling)  OT Visit Diagnosis: Unsteadiness on feet (R26.81);Other abnormalities of gait and mobility (R26.89);Pain Pain - Right/Left: Left Pain - part of body: Shoulder   Activity Tolerance Patient limited by pain;Patient limited by fatigue;Patient limited by  lethargy   Patient Left with call bell/phone within reach;in chair;with chair alarm set   Nurse Communication Other (comment) (ok to participate, updated on patient status and participation)        Time: 1030-1102 OT Time Calculation (min): 32 min  Charges: OT General Charges $OT Visit: 1 Visit OT Treatments $Self Care/Home Management : 23-37 mins  Rosalio Loud, MS Acute Rehabilitation Department Office# [phone]   Selinda Flavin 08/20/2022, 11:35 AM

## 2022-08-20 NOTE — Progress Notes (Signed)
[facility]  PROGRESS NOTE  [name] ZOX:[id number] DOB: 1950-10-06 DOA: 08/18/2022 PCP: Kerri Perches, MD   Brief HPI:   72 year old female with medical history of hypertension, bipolar disorder, diabetes mellitus type 2 who was admitted to hospital after elective left shoulder replacement with Dr. Devonne Doughty.  Patient blood pressure was significantly elevated, [facility] was consulted for management of hypertension. At home patient takes Norvasc, Lopressor, Aldactone, telmisartan for blood pressure.    Assessment/Plan:   Hypertensive urgency -Resolved after starting home medications -Clonidine was discontinued, continue hydralazine as needed  Hypertension -Continue amlodipine 10 mg daily, irbesartan 75 mg daily, metoprolol 50 mg p.o. twice daily, Aldactone 50 mg daily  Diabetes mellitus type 2 -Continue sliding scale insulin NovoLog -CBG well-controlled  Hypothyroidism -Continue Synthroid  Medications     acidophilus  2 capsule Oral TID   amLODipine  10 mg Oral Daily   ascorbic acid  500 mg Oral Daily   aspirin EC  81 mg Oral Q breakfast   buPROPion  150 mg Oral Daily   calcium-vitamin D  1 tablet Oral Q breakfast   cholecalciferol  1,000 Units Oral Daily   cycloSPORINE  1 drop Both Eyes BID   docusate sodium  100 mg Oral BID   doxycycline  100 mg Oral BID   ezetimibe  10 mg Oral Daily   insulin aspart  0-15 Units Subcutaneous TID WC   insulin aspart  0-5 Units Subcutaneous QHS   insulin aspart  4 Units Subcutaneous TID WC   insulin glargine-yfgn  20 Units Subcutaneous QHS   irbesartan  75 mg Oral Daily   lamoTRIgine  100 mg Oral BID   levothyroxine  25 mcg Oral Once per day on Sun   levothyroxine  50 mcg Oral Once per day on Mon Tue Wed Thu Fri Sat   LORazepam  0.5 mg Oral BID   metoprolol tartrate  50 mg Oral BID   mirabegron ER  25 mg Oral Daily   montelukast  10 mg Oral Daily   multivitamin with minerals  1 tablet Oral Daily   omega-3 acid  ethyl esters  1 g Oral Daily   pantoprazole  40 mg Oral Daily   potassium chloride  10 mEq Oral BID   pregabalin  75 mg Oral Daily   risperiDONE  0.5 mg Oral QHS   rosuvastatin  5 mg Oral QHS   sertraline  100 mg Oral Daily   spironolactone  50 mg Oral Daily   traZODone  150 mg Oral QHS   vitamin E  400 Units Oral Daily     Data Reviewed:   CBG:  Recent Labs  Lab 08/19/22 0746 08/19/22 1211 08/19/22 1637 08/19/22 2126 08/20/22 0745  GLUCAP 189* 86 130* 129* 113*    SpO2: 95 % O2 Flow Rate (L/min): 3 L/min    Vitals:   08/19/22 1600 08/19/22 2123 08/20/22 0039 08/20/22 0635  BP: (!) 175/75 (!) 186/71 (!) 159/66 (!) 131/48  Pulse: 73 86 79 80  Resp: 18 20 18 18   Temp: 98.2 F (36.8 C) 99.2 F (37.3 C) 100.1 F (37.8 C) 99.9 F (37.7 C)  TempSrc: Oral Oral Oral Oral  SpO2: 97% 97% 98% 95%  Weight:      Height:          Data Reviewed:  Basic Metabolic Panel: Recent Labs  Lab 08/18/22 1435 08/19/22 0316  NA 138 134*  K 4.0 4.6  CL 106 97*  CO2  22 25  GLUCOSE 233* 189*  BUN 14 16  CREATININE 0.80 0.96  CALCIUM 8.6* 8.4*    CBC: Recent Labs  Lab 08/15/22 1344 08/18/22 1435 08/19/22 0316  WBC 6.2 7.9  --   NEUTROABS  --  5.8  --   HGB 10.5* 10.9* 10.9*  HCT 36.4 38.7 38.0  MCV 80.5 81.6  --   PLT 233 230  --     LFT No results for input(s): "AST", "ALT", "ALKPHOS", "BILITOT", "PROT", "ALBUMIN" in the last 168 hours.   Antibiotics: Anti-infectives (From admission, onward)    Start     Dose/Rate Route Frequency Ordered Stop   08/18/22 2200  doxycycline (VIBRA-TABS) tablet 100 mg        100 mg Oral 2 times daily 08/18/22 1603     08/18/22 2200  vancomycin (VANCOCIN) IVPB 1000 mg/200 mL premix       Note to Pharmacy: Can you assist with dosing for post op antibiotic prophylaxis   1,000 mg 200 mL/hr over 60 Minutes Intravenous Every 24 hours 08/18/22 1607 08/18/22 2230   08/18/22 0730  levofloxacin (LEVAQUIN) IVPB 500 mg        500  mg 100 mL/hr over 60 Minutes Intravenous On call to O.R. 08/18/22 0725 08/18/22 1243   08/18/22 0730  vancomycin (VANCOCIN) IVPB 1000 mg/200 mL premix        1,000 mg 200 mL/hr over 60 Minutes Intravenous On call to O.R. 08/18/22 0725 08/18/22 0910           Subjective   Denies chest pain or shortness of breath.  Objective    Physical Examination:   General-appears in no acute distress Heart-S1-S2, regular, no murmur auscultated Lungs-clear to auscultation bilaterally, no wheezing or crackles auscultated Abdomen-soft, nontender, no organomegaly Extremities-left shoulder in dressing Neuro-alert, oriented x3, no focal deficit noted   Status is: Inpatient:             [name]   [facility] If 7PM-7AM, please contact night-coverage at [email or web address], Office  [phone]   08/20/2022, 9:18 AM  LOS: 2 days

## 2022-08-21 DIAGNOSIS — I10 Essential (primary) hypertension: Secondary | ICD-10-CM | POA: Diagnosis not present

## 2022-08-21 DIAGNOSIS — Z96612 Presence of left artificial shoulder joint: Secondary | ICD-10-CM | POA: Diagnosis not present

## 2022-08-21 LAB — GLUCOSE, CAPILLARY
Glucose-Capillary: 115 mg/dL — ABNORMAL HIGH (ref 70–99)
Glucose-Capillary: 130 mg/dL — ABNORMAL HIGH (ref 70–99)
Glucose-Capillary: 136 mg/dL — ABNORMAL HIGH (ref 70–99)
Glucose-Capillary: 122 mg/dL — ABNORMAL HIGH (ref 70–99)

## 2022-08-21 NOTE — TOC Initial Note (Signed)
Transition of Care [facility] - Initial/Assessment Note    Patient Details  Name: [name] MRN: [id number] Date of Birth: [birth year]  [facility]) CM/SW Contact:    Otelia Santee, LCSW Phone Number: 08/21/2022, 11:55 AM  Clinical Narrative:                 Pt in hospital for left shoulder replacement. Plan for pt to return home. TOC will continue to follow for discharge needs.  Expected Discharge Plan: Home/Self Care Barriers to Discharge: Continued Medical Work up   Patient Goals and CMS Choice Patient states their goals for this hospitalization and ongoing recovery are:: Unable to assess CMS Medicare.gov Compare Post Acute Care list provided to:: Patient Choice offered to / list presented to : [facility]provided to:: Patient    Expected Discharge Plan and Services In-house Referral: NA Discharge Planning Services: NA Post Acute Care Choice: NA Living arrangements for the past 2 months: Apartment                 DME Arranged: N/A DME Agency: NA                  Prior Living Arrangements/Services Living arrangements for the past 2 months: Apartment Lives with:: Self Patient language and need for interpreter reviewed:: Yes Do you feel safe going back to the place where you live?: Yes      Need for Family Participation in Patient Care: No (Comment) Care giver support system in place?: No (comment) Current home services: DME Criminal Activity/Legal Involvement Pertinent to Current Situation/Hospitalization: No - Comment as needed  Activities of Daily Living Home Assistive Devices/Equipment: Walker (specify type), Bedside commode/3-in-1, Cane (specify quad or straight), Dentures (specify type), Grab bars in shower, Shower chair with back, Hospital bed ADL Screening (condition at time of admission) Patient's cognitive ability adequate to safely complete daily activities?: No Is the patient deaf or  have difficulty hearing?: No Does the patient have difficulty seeing, even when wearing glasses/contacts?: Yes Does the patient have difficulty concentrating, remembering, or making decisions?: Yes Patient able to express need for assistance with ADLs?: No Does the patient have difficulty dressing or bathing?: Yes Independently performs ADLs?: No Communication: Appropriate for developmental age Dressing (OT): Needs assistance Is this a change from baseline?: Pre-admission baseline Grooming: Needs assistance Is this a change from baseline?: Pre-admission baseline Feeding: Needs assistance Is this a change from baseline?: Pre-admission baseline Bathing: Needs assistance Is this a change from baseline?: Pre-admission baseline Toileting: Needs assistance Is this a change from baseline?: Pre-admission baseline In/Out Bed: Needs assistance Is this a change from baseline?: Pre-admission baseline Walks in Home: Needs assistance Is this a change from baseline?: Pre-admission baseline Does the patient have difficulty walking or climbing stairs?: Yes Weakness of Legs: Both Weakness of Arms/Hands: Both  Permission Sought/Granted   Permission granted to share information with : No              Emotional Assessment   Attitude/Demeanor/Rapport: Unable to Assess Affect (typically observed): Unable to Assess Orientation: : Oriented to Self, Oriented to Place, Oriented to Situation, Oriented to  Time Alcohol / Substance Use: Not Applicable Psych Involvement: No (comment)  Admission diagnosis:  H/O total shoulder replacement, left [W09.811] Patient Active Problem List   Diagnosis Date Noted   H/O total shoulder replacement, left 08/18/2022   Scalp cyst 08/10/2022   Right-sided chest pain 08/06/2022   CKD (chronic kidney  disease) stage 3, GFR 30-59 ml/min (HCC) 07/03/2022   Skin tag 07/03/2022   Itchy scalp 07/03/2022   Preop examination 07/03/2022   Acute pain of left shoulder due to  trauma 04/30/2022   Left-sided chest pain 04/27/2022   Sore throat 03/12/2022   Encounter for Medicare annual examination with abnormal findings 03/09/2022   Encounter for medication review and counseling 01/31/2022   Rhinitis 01/16/2022   Bilateral leg edema 12/21/2021   Hypoglycemia 12/20/2021   Skin lesions, generalized 12/19/2021   Insomnia due to stress 12/19/2021   Major depressive disorder 12/11/2021   Anemia in chronic kidney disease (CKD) 10/12/2021   Subconjunctival hemorrhage of left eye 10/02/2021   Dehydration 09/25/2021   Encounter for support and coordination of transition of care 09/25/2021   Acute renal failure superimposed on stage 3a chronic kidney disease (HCC) 09/23/2021   AKI (acute kidney injury) (HCC) 09/16/2021   CHF (congestive heart failure) (HCC) 09/16/2021   Abnormal EKG 09/16/2021   Urinary frequency 09/01/2021   Anemia of chronic disease 08/10/2021   Generalized pain 08/04/2021   Sciatica, right side 07/19/2021   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/10/2021   Atherosclerosis of aorta (HCC) 07/10/2021   Generalized weakness 05/12/2021   Advanced care planning/counseling discussion 12/23/2020   Head trauma, initial encounter 12/20/2020   Trigger finger, right 12/12/2020   Confusion 11/24/2020   Blurry vision 11/18/2020   Cervical radiculopathy 07/09/2020   Closed fracture of lateral malleolus of right fibula 02/27/2020   Headache 02/19/2020   Tubular adenoma of colon 02/09/2019   Benign neoplasm of cerebral meninges (HCC) 11/12/2018   Benign meningioma of brain (HCC) 10/31/2018   Osteoarthritis of both hips 07/06/2018   Lipoma of extremity 12/31/2017   Impingement syndrome of left shoulder region 12/27/2017   Encounter for examination following treatment at hospital 11/25/2017   Weakness generalized    Leg weakness, bilateral 10/28/2017   Lumbar spondylosis with myelopathy  10/12/2017   Numbness of hand 10/10/2017   Chronic neck pain 07/25/2017   Chronic pain syndrome 07/25/2017   At risk for cardiovascular event 06/10/2017   Acute metabolic encephalopathy 10/15/2016   Diabetic polyneuropathy associated with type 2 diabetes mellitus (HCC) 05/19/2016   Tobacco user 04/24/2016   Obesity (BMI 30.0-34.9) 04/24/2016   History of palpitations 08/09/2015   Labile hypertension 08/03/2015   Normal coronary arteries 08/03/2015   Dizziness 07/15/2015   Left-sided low back pain with left-sided sciatica 06/27/2015   Multinodular goiter 05/06/2015   Rectocele, female 04/27/2015   Anal sphincter incontinence 04/27/2015   Pelvic relaxation due to rectocele 03/30/2015   Hyperkalemia 02/22/2015   Pulmonary hypertension (HCC) 02/22/2015   Posterior chest pain 02/21/2015   Episodic cigarette smoking dependence 01/11/2015   Migraine without aura and without status migrainosus, not intractable 07/02/2014   Flatulence 02/18/2014   Microcytic anemia 02/18/2014   Hip pain, right 12/17/2013   COPD mixed type (HCC) 09/16/2013   Acquired hypothyroidism 08/16/2013   Gastroparesis 04/28/2013   Nicotine dependence 03/09/2013   Seizure disorder (HCC) 01/19/2013   Displacement of cervical intervertebral disc without myelopathy 12/13/2012   Bursitis of shoulder 12/13/2012   Cervical disc disorder with radiculopathy of cervical region 10/31/2012   Solitary pulmonary nodule 08/19/2012   Anemia 07/05/2012   Hypersomnia disorder related to a known organic factor 06/11/2012   Pruritus 04/18/2012   Meningioma (HCC) 11/19/2011   Mononeuritis leg 10/25/2011   Carpal tunnel syndrome of right wrist 05/23/2011  Chronic pain of right hand 05/04/2011   Polypharmacy 04/28/2011   Mood disorder (HCC) 04/28/2011   Constipation 04/13/2011   Recurrent falls 12/12/2010   Oropharyngeal dysphagia 07/12/2010   Urinary incontinence 12/16/2009   HEARING LOSS 10/26/2009   DMII (diabetes  mellitus, type 2) (HCC) 07/07/2009   Mixed hyperlipidemia 12/11/2008   IBS 12/11/2008   GERD 07/29/2008   MILK PRODUCTS ALLERGY 07/29/2008   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/03/2007   Essential hypertension 06/27/2007   Backache 06/19/2007   Osteoporosis 06/19/2007   Obstructive sleep apnea 06/19/2007   TRIGGER FINGER 04/18/2007   DIVERTICULOSIS, COLON 11/13/2006   PCP:  Kerri Perches, MD Pharmacy:   [facility] [place] [place] - [address] [place] [address] Phone: [phone] Fax: [phone]     Social Determinants of Health (SDOH) Social History: SDOH Screenings   Food Insecurity: No Food Insecurity (08/20/2022)  Housing: Patient Declined (08/20/2022)  Transportation Needs: No Transportation Needs (08/20/2022)  Utilities: Not At Risk (08/20/2022)  Alcohol Screen: Low Risk  (07/17/2022)  Depression (PHQ2-9): Low Risk  (08/03/2022)  Recent Concern: Depression (PHQ2-9) - High Risk (05/25/2022)  Financial Resource Strain: Low Risk  (07/17/2022)  Physical Activity: Insufficiently Active (07/17/2022)  Social Connections: Moderately Integrated (07/17/2022)  Stress: No Stress Concern Present (07/17/2022)  Tobacco Use: Medium Risk (08/18/2022)   SDOH Interventions:     Readmission Risk Interventions    08/21/2022   11:54 AM 09/16/2021   10:07 AM  Readmission Risk Prevention Plan  Transportation Screening Complete Complete  Medication Review Oceanographer) Complete Complete  PCP or Specialist appointment within 3-5 days of discharge Complete   HRI or Home Care Consult Complete Complete  SW Recovery Care/Counseling Consult Complete Complete  Palliative Care Screening Not Applicable Not Applicable  Skilled Nursing Facility Not Applicable Patient Refused

## 2022-08-21 NOTE — Progress Notes (Signed)
Changed time for patients lyrica and ativan due to drowsiness; sent secured chat to Dr Ranell Patrick

## 2022-08-21 NOTE — Progress Notes (Signed)
Occupational Therapy Treatment Patient Details Name: [name] MRN: [id number] DOB: Dec 26, 1950 Today's Date: 08/21/2022   History of present illness Patient is a 72 year old female who presented on 6/7 for elective left shoulder arthroplasty. Post op patient was having difficulty with waking up thus transitioned to SDU. Patient also noted to have HTN. PMH: anxiety, brain tumor, cancer, glaucoma, major depression, narcolepsy, seizures, stroke with L side weakness, back surgery, craniotomy, carpal tunnel release L side,   OT comments  Patient was seen a 2nd session with PT to orient patient to date and get patient up on feet more to prevent increased weakness. Patient was noted to have less lethargy than previous day with maintaining confusion. Patient noted to be more confused with attempts to orient patient to place and date. Patient repeating Thursday March 10 during session when attempting to be oriented. Patient was able to progress to increase functional mobility in room with need for +2 for safety with patients confusion and lethargy. Patient's discharge plan remains appropriate at this time. OT will continue to follow acutely.     Recommendations for follow up therapy are one component of a multi-disciplinary discharge planning process, led by the attending physician.  Recommendations may be updated based on patient status, additional functional criteria and insurance authorization.    Assistance Recommended at Discharge Frequent or constant Supervision/Assistance  Patient can return home with the following  A lot of help with bathing/dressing/bathroom;A lot of help with walking and/or transfers;Assistance with cooking/housework;Direct supervision/assist for medications management;Assist for transportation;Help with stairs or ramp for entrance;Direct supervision/assist for financial management   Equipment Recommendations  None recommended by OT       Precautions / Restrictions  Precautions Precautions: Shoulder Type of Shoulder Precautions: no ROM shoulder, ok for hand wrist and elbow AROM, Shoulder Interventions: Shoulder sling/immobilizer;At all times;Off for dressing/bathing/exercises Precaution Booklet Issued: Yes (comment) Restrictions Weight Bearing Restrictions: Yes LUE Weight Bearing: Non weight bearing       Mobility Bed Mobility Overal bed mobility: Needs Assistance Bed Mobility: Sit to Supine     Supine to sit: Min assist Sit to supine: Min assist   General bed mobility comments: increased time and effort to lift BLE into bed, therapists managing linen and helping to assist into comfortable position with bedpad and pillows under surgical arm    Transfers Overall transfer level: Needs assistance Equipment used: Rolling walker (2 wheels), 1 person hand held assist Transfers: Sit to/from Stand, Bed to chair/wheelchair/BSC Sit to Stand: +2 safety/equipment, Min assist Stand pivot transfers: Min assist, +2 safety/equipment         General transfer comment: Initial STS with RW for familiarity with pt in sling, cues to use on R UE on RW upright, min A +2 for safety and heavy verbal cues for BLE engagement to power up, pt using wide BOS. Pt needing min A+2 for safety with stand pivot recliner to and HHA, verbal cues for shoulder restrictions and sequencing with mobility     Balance Overall balance assessment: Needs assistance Sitting-balance support: Feet supported, Single extremity supported Sitting balance-Leahy Scale: Fair           ADL either performed or assessed with clinical judgement   ADL Overall ADL's : Needs assistance/impaired   Eating/Feeding Details (indicate cue type and reason): upon entrance in room patient was resting head on R bed rail with tray on L side of bed with patient reporting that she was attempting to eat breakfast. tray looked to be  untouched. after BSC and transition to recliner, patient declined to eat food  asking for it to be removed.                     Toilet Transfer: Stand-pivot;BSC/3in1;Minimal Dentist Details (indicate cue type and reason): with HHA and increased cues. to take steps to recliner with +2 present for safety Toileting- Clothing Manipulation and Hygiene: Maximal assistance;Sit to/from stand Toileting - Clothing Manipulation Details (indicate cue type and reason): unable to maintain balance and participate in hygiene or clothing management tasks.       General ADL Comments: patient able to engage in functional mobility in room with increased encouragement. patient noted at times to be "sleeping" with eyes open taking a long time to process cues. patient noted to be increasingy confused with attempt to orient patient to date.    Extremity/Trunk Assessment Upper Extremity Assessment Upper Extremity Assessment: Defer to OT evaluation LUE Deficits / Details: patient non compliant with sling with it noted to be off when entering room. nurse updated on restrictions with nurse reporting having put the sling back on already 1x today. patient able to wiggile digits and forearm minimally but increased lethargy effecting ability to follow cues for ROM exercises. no assist offered with passive to initate movement in wrist or forearm.   Lower Extremity Assessment Lower Extremity Assessment: Generalized weakness (AROM WFL, grossly 3+/5, denies numbness/tingling throughout BLE)   Cervical / Trunk Assessment Cervical / Trunk Assessment: Kyphotic     Cognition Arousal/Alertness: Lethargic Behavior During Therapy: Flat affect Overall Cognitive Status: Difficult to assess           General Comments: Pt states being in [facility] Pt states date is June 22 and states had surgery on Tuesday the 2nd, once therapist reoriented pt to surgery day and current date, pt then says "It's March, no July" and continues a loop of naming months. Pt able to follow 1  step commands consistently.                   Pertinent Vitals/ Pain       Pain Assessment Pain Assessment: Faces Faces Pain Scale: Hurts a little bit Pain Location: L shoulder Pain Descriptors / Indicators: Grimacing, Guarding Pain Intervention(s): Limited activity within patient's tolerance, Monitored during session, Repositioned         Frequency  Min 1X/week        Progress Toward Goals  OT Goals(current goals can now be found in the care plan section)  Progress towards OT goals: Progressing toward goals     Plan Discharge plan remains appropriate    Co-evaluation    PT/OT/SLP Co-Evaluation/Treatment: Yes Reason for Co-Treatment: Necessary to address cognition/behavior during functional activity;For patient/therapist safety;To address functional/ADL transfers PT goals addressed during session: Mobility/safety with mobility;Balance;Proper use of DME OT goals addressed during session: ADL's and self-care      AM-PAC OT "6 Clicks" Daily Activity     Outcome Measure   Help from another person eating meals?: A Little Help from another person taking care of personal grooming?: A Little Help from another person toileting, which includes using toliet, bedpan, or urinal?: A Lot Help from another person bathing (including washing, rinsing, drying)?: A Lot Help from another person to put on and taking off regular upper body clothing?: A Lot Help from another person to put on and taking off regular lower body clothing?: A Lot 6 Click Score: 14    End  of Session Equipment Utilized During Treatment: Other (comment);Gait belt (sling)  OT Visit Diagnosis: Unsteadiness on feet (R26.81);Other abnormalities of gait and mobility (R26.89);Pain Pain - Right/Left: Left Pain - part of body: Shoulder   Activity Tolerance Patient limited by fatigue;Patient limited by lethargy   Patient Left in bed;with call bell/phone within reach;with bed alarm set   Nurse Communication  Mobility status        Time: 1610-9604 OT Time Calculation (min): 26 min  Charges: OT General Charges $OT Visit: 1 Visit OT Treatments $Self Care/Home Management : 23-37 mins $Therapeutic Activity: 8-22 mins  Rosalio Loud, MS Acute Rehabilitation Department Office# [phone]   Selinda Flavin 08/21/2022, 2:53 PM

## 2022-08-21 NOTE — Progress Notes (Signed)
Occupational Therapy Treatment Patient Details Name: [name] MRN: [id number] DOB: [birth year] Today's Date: 08/21/2022   History of present illness Patient is a 72 year old female who presented on 6/7 for elective left shoulder arthroplasty. Post op patient was having difficulty with waking up thus transitioned to SDU. Patient also noted to have HTN. PMH: anxiety, brain tumor, cancer, glaucoma, major depression, narcolepsy, seizures, stroke with L side weakness, back surgery, craniotomy, carpal tunnel release L side,   OT comments  Patient was slightly less lethargic than previous sessions with continued need to have cues to maintain on tasks and not asleep. Patient unable to maintain alertness to complete LUE exercises even with consistent cues. Patient is min A for transfers with HHA with poor safety awareness and inability to safely increase functional mobility with level of lethargy with 1x assist at this time.  Secure chat sent to ortho MD on this date and PT order added. Patient will need 24/7 caregiver support at time of d/c. Patient's discharge plan remains appropriate at this time. OT will continue to follow acutely.     Recommendations for follow up therapy are one component of a multi-disciplinary discharge planning process, led by the attending physician.  Recommendations may be updated based on patient status, additional functional criteria and insurance authorization.    Assistance Recommended at Discharge Frequent or constant Supervision/Assistance  Patient can return home with the following  A lot of help with bathing/dressing/bathroom;A lot of help with walking and/or transfers;Assistance with cooking/housework;Direct supervision/assist for medications management;Assist for transportation;Help with stairs or ramp for entrance;Direct supervision/assist for financial management   Equipment Recommendations  None recommended by OT       Precautions / Restrictions  Precautions Precautions: Shoulder Type of Shoulder Precautions: no ROM shoulder, ok for hand wrist and elbow AROM, Shoulder Interventions: Shoulder sling/immobilizer;At all times;Off for dressing/bathing/exercises Precaution Booklet Issued: Yes (comment) Restrictions Weight Bearing Restrictions: Yes LUE Weight Bearing: Non weight bearing       Mobility Bed Mobility Overal bed mobility: Needs Assistance Bed Mobility: Supine to Sit     Supine to sit: Min assist     General bed mobility comments: with HOB raised.          Balance Overall balance assessment: Needs assistance Sitting-balance support: Feet supported, Single extremity supported Sitting balance-Leahy Scale: Fair             ADL either performed or assessed with clinical judgement   ADL Overall ADL's : Needs assistance/impaired   Eating/Feeding Details (indicate cue type and reason): upon entrance in room patient was resting head on R bed rail with tray on L side of bed with patient reporting that she was attempting to eat breakfast. tray looked to be untouched. after BSC and transition to recliner, patient declined to eat food asking for it to be removed.                     Toilet Transfer: Min Acupuncturist Details (indicate cue type and reason): with HHA and increased cues. patient turning to recliner prior to therapist being able to manage clothing even with cues. Toileting- Clothing Manipulation and Hygiene: Maximal assistance;Sit to/from stand Toileting - Clothing Manipulation Details (indicate cue type and reason): unable to maintain balance and participate in hygiene or clothing management tasks.            Extremity/Trunk Assessment Upper Extremity Assessment Upper Extremity Assessment: LUE deficits/detail LUE Deficits / Details: patient non compliant with  sling with it noted to be off when entering room. nurse updated on restrictions with nurse reporting  having put the sling back on already 1x today. patient able to wiggile digits and forearm minimally but increased lethargy effecting ability to follow cues for ROM exercises. no assist offered with passive to initate movement in wrist or forearm.       Cervical / Trunk Assessment Cervical / Trunk Assessment: Kyphotic     Cognition Arousal/Alertness: Lethargic Behavior During Therapy: Flat affect Overall Cognitive Status: Difficult to assess             General Comments: Patient slightly more alert than previous session but continues to need excessive cues and consistent attention to stay alert and attend to task.                   Pertinent Vitals/ Pain       Pain Assessment Pain Assessment: Faces Faces Pain Scale: Hurts whole lot Pain Location: L shoulder with movement into appropriate positioning (patient non compliant with sling) Pain Descriptors / Indicators: Discomfort, Grimacing, Operative site guarding, Constant Pain Intervention(s): Limited activity within patient's tolerance, Monitored during session, Repositioned, Patient requesting pain meds-RN notified         Frequency  Min 1X/week        Progress Toward Goals  OT Goals(current goals can now be found in the care plan section)  Progress towards OT goals: Not progressing toward goals - comment (lethargy)     Plan Discharge plan remains appropriate       AM-PAC OT "6 Clicks" Daily Activity     Outcome Measure   Help from another person eating meals?: A Little Help from another person taking care of personal grooming?: A Little Help from another person toileting, which includes using toliet, bedpan, or urinal?: A Lot Help from another person bathing (including washing, rinsing, drying)?: A Lot Help from another person to put on and taking off regular upper body clothing?: A Lot Help from another person to put on and taking off regular lower body clothing?: A Lot 6 Click Score: 14    End of  Session Equipment Utilized During Treatment: Other (comment) (sling)  OT Visit Diagnosis: Unsteadiness on feet (R26.81);Other abnormalities of gait and mobility (R26.89);Pain Pain - Right/Left: Left Pain - part of body: Shoulder   Activity Tolerance Patient limited by fatigue;Patient limited by lethargy   Patient Left in bed;with call bell/phone within reach;with family/visitor present   Nurse Communication Other (comment) (concerns over lethargy and participation in session)        Time: 4034-7425 OT Time Calculation (min): 24 min  Charges: OT General Charges $OT Visit: 1 Visit OT Treatments $Self Care/Home Management : 23-37 mins  Rosalio Loud, MS Acute Rehabilitation Department Office# [phone]   Selinda Flavin 08/21/2022, 11:40 AM

## 2022-08-21 NOTE — Progress Notes (Signed)
Patient is very drowsy and unable to stay awake to safely swallow medications; notified doctor that I will hold medications until later; notified doctor that patient did not eat and blood sugar was 115 Dr Sharl Ma approved holding morning dose of insulin

## 2022-08-21 NOTE — Care Management Important Message (Signed)
Important Message  Patient Details IM Letter given. Name: [name] MRN: [id number] Date of Birth: 03-25-1950   Medicare Important Message Given:  Yes     Caren Macadam 08/21/2022, 9:40 AM

## 2022-08-21 NOTE — Evaluation (Signed)
Physical Therapy Evaluation Patient Details Name: [name] MRN: [id number] DOB: 11-Sep-1950 Today's Date: 08/21/2022  History of Present Illness  Patient is a 72 year old female who presented on 6/7 for elective left shoulder arthroplasty. Post op patient was having difficulty with waking up thus transitioned to SDU. Patient also noted to have HTN. PMH: anxiety, brain tumor, cancer, glaucoma, major depression, narcolepsy, seizures, stroke with L side weakness, back surgery, craniotomy, carpal tunnel release L side,  Clinical Impression  Pt s/p L total shoulder replacement 6/7. Pt with family support and aide at baseline, using rollator for home ambulation and electric buggy for grocery store, aide present to assist as needed daily. Pt currently able to follow 1 step commands, though disoriented and needing redirection and attention to task cues. Pt performs STS and stand pivot transfers with min A+2 for safety. Trialed RW for transfers and amb with pt using R upright and therapist managing L, but unsuccessful, pt more safe with HHA. Pt amb 3 ft, 71ft and 10 ft with HHA and recliner follow, demo varying BOS that changes from wide to narrow, short and choppy steps with minimal bil foot clearance. Pt needing cues for precautions, able to state "no pushing down" as a precaution. Pt returns to supine in bed with min A at EOS, linen and propping assisted by therapists and all needs in reach. Pt currently with functional limitations due to the deficits listed below (see PT Problem List). Pt will benefit from acute skilled PT to increase their independence and safety with mobility to allow discharge.          Recommendations for follow up therapy are one component of a multi-disciplinary discharge planning process, led by the attending physician.  Recommendations may be updated based on patient status, additional functional criteria and insurance authorization.  Follow Up Recommendations       Assistance  Recommended at Discharge Frequent or constant Supervision/Assistance  Patient can return home with the following  A little help with walking and/or transfers;A little help with bathing/dressing/bathroom;Assistance with cooking/housework;Assist for transportation    Equipment Recommendations None recommended by PT  Recommendations for Other Services       Functional Status Assessment Patient has had a recent decline in their functional status and demonstrates the ability to make significant improvements in function in a reasonable and predictable amount of time.     Precautions / Restrictions Precautions Precautions: Shoulder Type of Shoulder Precautions: no ROM shoulder, ok for hand wrist and elbow AROM, Shoulder Interventions: Shoulder sling/immobilizer;At all times;Off for dressing/bathing/exercises Precaution Booklet Issued: Yes (comment) Restrictions Weight Bearing Restrictions: Yes LUE Weight Bearing: Non weight bearing      Mobility  Bed Mobility Overal bed mobility: Needs Assistance Bed Mobility: Sit to Supine       Sit to supine: Min assist   General bed mobility comments: increased time and effort to lift BLE into bed, therapists managing linen and helping to assist into comfortable position with bedpad and pillows under surgical arm    Transfers Overall transfer level: Needs assistance Equipment used: Rolling walker (2 wheels), 1 person hand held assist Transfers: Sit to/from Stand, Bed to chair/wheelchair/BSC Sit to Stand: +2 safety/equipment, Min assist Stand pivot transfers: Min assist, +2 safety/equipment         General transfer comment: Initial STS with RW for familiarity with pt in sling, cues to use on R UE on RW upright, min A +2 for safety and heavy verbal cues for BLE engagement to  power up, pt using wide BOS. Pt needing min A+2 for safety with stand pivot recliner to and HHA, verbal cues for shoulder restrictions and sequencing with mobility     Ambulation/Gait Ambulation/Gait assistance: Min assist, +2 safety/equipment Gait Distance (Feet): 10 Feet Assistive device: 1 person hand held assist Gait Pattern/deviations: Step-to pattern, Decreased stride length, Shuffle Gait velocity: decreased     General Gait Details: Pt amb 3 ft, 40ft, 10 ft; demo short, shuffling steps with decreased heel-toe gait pattern, increased lateral weight-shifting, pt readjusting BOS wider and more narrow with steps, trunk flexed, holding therapist's hand with RUE and recliner follow for safety; attempted RW with pt using RUE and therapist managing L side of RW but unsuccessful, HHA safer option with +2 support  Stairs            Wheelchair Mobility    Modified Rankin (Stroke Patients Only)       Balance Overall balance assessment: Needs assistance Sitting-balance support: Feet supported, Single extremity supported Sitting balance-Leahy Scale: Fair     Standing balance support: Reliant on assistive device for balance, During functional activity, Single extremity supported Standing balance-Leahy Scale: Poor                               Pertinent Vitals/Pain Pain Assessment Pain Assessment: Faces Faces Pain Scale: Hurts a little bit Pain Location: L shoulder Pain Descriptors / Indicators: Grimacing, Guarding Pain Intervention(s): Limited activity within patient's tolerance, Monitored during session, Repositioned    Home Living Family/patient expects to be discharged to:: Private residence Living Arrangements: Alone Available Help at Discharge: Personal care attendant;Available PRN/intermittently;Family;Friend(s) Type of Home: Apartment Home Access: Level entry       Home Layout: One level Home Equipment: Agricultural consultant (2 wheels);Cane - single point;BSC/3in1;Hand held shower head;Grab bars - toilet;Grab bars - tub/shower;Cane - quad;Rollator (4 wheels);Shower seat - built in;Wheelchair - manual;Hospital bed       Prior Function Prior Level of Function : Needs assist       Physical Assist : Mobility (physical);ADLs (physical) Mobility (physical): Bed mobility;Transfers;Gait;Stairs ADLs (physical): IADLs Mobility Comments: household ambulator using Rollator most of time, sometimes single point cane, uses electric carts in grocery stores ADLs Comments: home aides 8 hours/day x 5 days/week; two different caregivers on weekend days.     Hand Dominance   Dominant Hand: Right    Extremity/Trunk Assessment   Upper Extremity Assessment Upper Extremity Assessment: Defer to OT evaluation LUE Deficits / Details: patient non compliant with sling with it noted to be off when entering room. nurse updated on restrictions with nurse reporting having put the sling back on already 1x today. patient able to wiggile digits and forearm minimally but increased lethargy effecting ability to follow cues for ROM exercises. no assist offered with passive to initate movement in wrist or forearm.    Lower Extremity Assessment Lower Extremity Assessment: Generalized weakness (AROM WFL, grossly 3+/5, denies numbness/tingling throughout BLE)    Cervical / Trunk Assessment Cervical / Trunk Assessment: Kyphotic  Communication   Communication: No difficulties  Cognition Arousal/Alertness: Lethargic Behavior During Therapy: Flat affect Overall Cognitive Status: Difficult to assess                                 General Comments: Pt states being in [facility]. Pt states date is June 22 and states had surgery  on Tuesday the 2nd, once therapist reoriented pt to surgery day and current date, pt then says "It's March, no July" and continues a loop of naming months. Pt able to follow 1 step commands consistently.        General Comments      Exercises     Assessment/Plan    PT Assessment Patient needs continued PT services  PT Problem List Decreased strength;Decreased range of motion;Decreased  activity tolerance;Decreased balance;Decreased mobility;Decreased cognition;Decreased knowledge of use of DME;Decreased safety awareness;Decreased knowledge of precautions       PT Treatment Interventions DME instruction;Gait training;Functional mobility training;Therapeutic activities;Therapeutic exercise;Balance training;Neuromuscular re-education;Patient/family education    PT Goals (Current goals can be found in the Care Plan section)  Acute Rehab PT Goals Patient Stated Goal: go to the bathroom PT Goal Formulation: With patient Time For Goal Achievement: 09/04/22 Potential to Achieve Goals: Good    Frequency Min 1X/week     Co-evaluation PT/OT/SLP Co-Evaluation/Treatment: Yes Reason for Co-Treatment: Necessary to address cognition/behavior during functional activity;For patient/therapist safety;To address functional/ADL transfers PT goals addressed during session: Mobility/safety with mobility;Balance;Proper use of DME         AM-PAC PT "6 Clicks" Mobility  Outcome Measure Help needed turning from your back to your side while in a flat bed without using bedrails?: A Little Help needed moving from lying on your back to sitting on the side of a flat bed without using bedrails?: A Little Help needed moving to and from a bed to a chair (including a wheelchair)?: A Little Help needed standing up from a chair using your arms (e.g., wheelchair or bedside chair)?: A Little Help needed to walk in hospital room?: Total Help needed climbing 3-5 steps with a railing? : Total 6 Click Score: 14    End of Session Equipment Utilized During Treatment: Gait belt Activity Tolerance: Patient tolerated treatment well;Patient limited by lethargy Patient left: in bed;with call bell/phone within reach;with bed alarm set Nurse Communication: Mobility status PT Visit Diagnosis: Muscle weakness (generalized) (M62.81);Other abnormalities of gait and mobility (R26.89);Pain Pain - Right/Left:  Left Pain - part of body: Shoulder    Time: 1610-9604 PT Time Calculation (min) (ACUTE ONLY): 27 min   Charges:   PT Evaluation $PT Eval Moderate Complexity: 1 Mod           Tori Leiam Hopwood PT, DPT 08/21/22, 2:37 PM

## 2022-08-21 NOTE — Progress Notes (Addendum)
[facility]  PROGRESS NOTE  [name] ZOX:[id number] DOB: Jan 22, 1951 DOA: 08/18/2022 PCP: Kerri Perches, MD   Brief HPI:   72 year old female with medical history of hypertension, bipolar disorder, diabetes mellitus type 2 who was admitted to hospital after elective left shoulder replacement with Dr. Devonne Doughty.  Patient blood pressure was significantly elevated, [facility] was consulted for management of hypertension. At home patient takes Norvasc, Lopressor, Aldactone, telmisartan for blood pressure.    Assessment/Plan:   Hypertensive urgency -Resolved after starting home medications -Clonidine was discontinued  Hypertension -Blood pressure is well-controlled, back to baseline -Continue amlodipine 10 mg daily, telmisartan 10 mg daily, metoprolol 50 mg p.o. twice daily, Aldactone 50 mg daily  Diabetes mellitus type 2 -Continue home medications  Hypothyroidism -Continue Synthroid  [facility] service will sign off  Call us again if needed  Medications     acidophilus  2 capsule Oral TID   amLODipine  10 mg Oral Daily   ascorbic acid  500 mg Oral Daily   aspirin EC  81 mg Oral Q breakfast   buPROPion  150 mg Oral Daily   calcium-vitamin D  1 tablet Oral Q breakfast   cholecalciferol  1,000 Units Oral Daily   cycloSPORINE  1 drop Both Eyes BID   docusate sodium  100 mg Oral BID   doxycycline  100 mg Oral BID   ezetimibe  10 mg Oral Daily   insulin aspart  0-15 Units Subcutaneous TID WC   insulin aspart  0-5 Units Subcutaneous QHS   insulin aspart  4 Units Subcutaneous TID WC   insulin glargine-yfgn  20 Units Subcutaneous QHS   irbesartan  75 mg Oral Daily   lamoTRIgine  100 mg Oral BID   levothyroxine  25 mcg Oral Once per day on Sun   levothyroxine  50 mcg Oral Once per day on Mon Tue Wed Thu Fri Sat   LORazepam  0.5 mg Oral BID   metoprolol tartrate  50 mg Oral BID   mirabegron ER  25 mg Oral Daily   montelukast  10 mg Oral Daily   multivitamin with  minerals  1 tablet Oral Daily   omega-3 acid ethyl esters  1 g Oral Daily   pantoprazole  40 mg Oral Daily   potassium chloride  10 mEq Oral BID   pregabalin  75 mg Oral Daily   risperiDONE  0.5 mg Oral QHS   rosuvastatin  5 mg Oral QHS   sertraline  100 mg Oral Daily   spironolactone  50 mg Oral Daily   traZODone  150 mg Oral QHS   vitamin E  400 Units Oral Daily     Data Reviewed:   CBG:  Recent Labs  Lab 08/20/22 0745 08/20/22 1158 08/20/22 1728 08/20/22 2036 08/21/22 0745  GLUCAP 113* 96 116* 115* 115*    SpO2: 100 % O2 Flow Rate (L/min): 3 L/min    Vitals:   08/20/22 0635 08/20/22 1510 08/20/22 2034 08/21/22 0528  BP: (!) 131/48 (!) 148/60 (!) 151/54 (!) 158/64  Pulse: 80 84 93 85  Resp: 18  16 16   Temp: 99.9 F (37.7 C) 97.6 F (36.4 C) 97.6 F (36.4 C) 99.2 F (37.3 C)  TempSrc: Oral Oral Oral   SpO2: 95% 97% 98% 100%  Weight:      Height:          Data Reviewed:  Basic Metabolic Panel: Recent Labs  Lab 08/18/22 1435 08/19/22 0316  NA 138  134*  K 4.0 4.6  CL 106 97*  CO2 22 25  GLUCOSE 233* 189*  BUN 14 16  CREATININE 0.80 0.96  CALCIUM 8.6* 8.4*    CBC: Recent Labs  Lab 08/15/22 1344 08/18/22 1435 08/19/22 0316  WBC 6.2 7.9  --   NEUTROABS  --  5.8  --   HGB 10.5* 10.9* 10.9*  HCT 36.4 38.7 38.0  MCV 80.5 81.6  --   PLT 233 230  --     LFT No results for input(s): "AST", "ALT", "ALKPHOS", "BILITOT", "PROT", "ALBUMIN" in the last 168 hours.   Antibiotics: Anti-infectives (From admission, onward)    Start     Dose/Rate Route Frequency Ordered Stop   08/18/22 2200  doxycycline (VIBRA-TABS) tablet 100 mg        100 mg Oral 2 times daily 08/18/22 1603     08/18/22 2200  vancomycin (VANCOCIN) IVPB 1000 mg/200 mL premix       Note to Pharmacy: Can you assist with dosing for post op antibiotic prophylaxis   1,000 mg 200 mL/hr over 60 Minutes Intravenous Every 24 hours 08/18/22 1607 08/18/22 2230   08/18/22 0730  levofloxacin  (LEVAQUIN) IVPB 500 mg        500 mg 100 mL/hr over 60 Minutes Intravenous On call to O.R. 08/18/22 0725 08/18/22 1243   08/18/22 0730  vancomycin (VANCOCIN) IVPB 1000 mg/200 mL premix        1,000 mg 200 mL/hr over 60 Minutes Intravenous On call to O.R. 08/18/22 0725 08/18/22 0910           Subjective   Patient seen and examined, blood pressure well-controlled.  Objective    Physical Examination:   Appears in no acute distress S1-S2, regular Lungs are clear to auscultation bilaterally Abdomen is soft, nontender, no organomegaly   Status is: Inpatient:             [name]   [facility] If 7PM-7AM, please contact night-coverage at [email or web address], Office  [phone]   08/21/2022, 8:13 AM  LOS: 3 days

## 2022-08-21 NOTE — Telephone Encounter (Signed)
Seen [name] and was referred at that visit

## 2022-08-21 NOTE — Progress Notes (Signed)
Orthopedics Progress Note  Subjective: Patient really sleepy the last few days per RN and family. "She is not herself."  She has been unable to do much of the therapy due to sedation. Pain controlled with Tylenol.  Objective:  Vitals:   08/21/22 0528 08/21/22 1324  BP: (!) 158/64 (!) 151/61  Pulse: 85 99  Resp: 16 18  Temp: 99.2 F (37.3 C) 98.6 F (37 C)  SpO2: 100% 96%    General: Awake and alert  Musculoskeletal: Left shoulder dressing CDI. No swelling in the left arm Neurovascularly intact  Lab Results  Component Value Date   WBC 7.9 08/18/2022   HGB 10.9 (L) 08/19/2022   HCT 38.0 08/19/2022   MCV 81.6 08/18/2022   PLT 230 08/18/2022       Component Value Date/Time   NA 134 (L) 08/19/2022 0316   NA 144 06/29/2022 1150   K 4.6 08/19/2022 0316   CL 97 (L) 08/19/2022 0316   CO2 25 08/19/2022 0316   GLUCOSE 189 (H) 08/19/2022 0316   BUN 16 08/19/2022 0316   BUN 28 (H) 06/29/2022 1150   CREATININE 0.96 08/19/2022 0316   CREATININE 1.24 (H) 09/30/2018 1143   CALCIUM 8.4 (L) 08/19/2022 0316   GFRNONAA >60 08/19/2022 0316   GFRNONAA 45 (L) 09/30/2018 1143   GFRAA 72 12/11/2019 1224   GFRAA 52 (L) 09/30/2018 1143    Lab Results  Component Value Date   INR 1.01 10/15/2017   INR 1.07 10/15/2016   INR 1.14 02/21/2015    Assessment/Plan: POD #3 s/p Procedure(s): REVERSE SHOULDER ARTHROPLASTY Patient seemingly over-sedated. She was on a lot of medication pre-hospital which have sedation as a side effect. She has had Tylenol for pain. I am discontinuing her stronger narcotics> also will D/C trazadone which was for sleep, Ativan, Robaxin, and Lyrica. Will leave her antidepressants alone for now.  Will ask Social Work to begin looking for short term SNF per therapy recommendations.    Almedia Balls. Ranell Patrick, MD 08/21/2022 5:26 PM

## 2022-08-22 ENCOUNTER — Inpatient Hospital Stay: Payer: Medicare HMO | Admitting: Physician Assistant

## 2022-08-22 ENCOUNTER — Inpatient Hospital Stay: Payer: Medicare HMO

## 2022-08-22 DIAGNOSIS — Z96612 Presence of left artificial shoulder joint: Secondary | ICD-10-CM | POA: Diagnosis not present

## 2022-08-22 LAB — URINALYSIS, ROUTINE W REFLEX MICROSCOPIC
Bilirubin Urine: NEGATIVE
Glucose, UA: NEGATIVE mg/dL
Hgb urine dipstick: NEGATIVE
Ketones, ur: NEGATIVE mg/dL
Leukocytes,Ua: NEGATIVE
Nitrite: NEGATIVE
Protein, ur: NEGATIVE mg/dL
Specific Gravity, Urine: 1.016 (ref 1.005–1.030)
pH: 5 (ref 5.0–8.0)

## 2022-08-22 LAB — CBC WITH DIFFERENTIAL/PLATELET
Abs Immature Granulocytes: 0.08 10*3/uL — ABNORMAL HIGH (ref 0.00–0.07)
Basophils Absolute: 0 10*3/uL (ref 0.0–0.1)
Basophils Relative: 0 %
Eosinophils Absolute: 0.2 10*3/uL (ref 0.0–0.5)
Eosinophils Relative: 2 %
HCT: 36.4 % (ref 36.0–46.0)
Hemoglobin: 10.8 g/dL — ABNORMAL LOW (ref 12.0–15.0)
Immature Granulocytes: 1 %
Lymphocytes Relative: 17 %
Lymphs Abs: 1.7 10*3/uL (ref 0.7–4.0)
MCH: 22.9 pg — ABNORMAL LOW (ref 26.0–34.0)
MCHC: 29.7 g/dL — ABNORMAL LOW (ref 30.0–36.0)
MCV: 77.1 fL — ABNORMAL LOW (ref 80.0–100.0)
Monocytes Absolute: 1.1 10*3/uL — ABNORMAL HIGH (ref 0.1–1.0)
Monocytes Relative: 11 %
Neutro Abs: 7.1 10*3/uL (ref 1.7–7.7)
Neutrophils Relative %: 69 %
Platelets: 290 10*3/uL (ref 150–400)
RBC: 4.72 MIL/uL (ref 3.87–5.11)
RDW: 14 % (ref 11.5–15.5)
WBC: 10.2 10*3/uL (ref 4.0–10.5)
nRBC: 0 % (ref 0.0–0.2)

## 2022-08-22 LAB — COMPREHENSIVE METABOLIC PANEL
ALT: 24 U/L (ref 0–44)
AST: 25 U/L (ref 15–41)
Albumin: 2.9 g/dL — ABNORMAL LOW (ref 3.5–5.0)
Alkaline Phosphatase: 62 U/L (ref 38–126)
Anion gap: 7 (ref 5–15)
BUN: 34 mg/dL — ABNORMAL HIGH (ref 8–23)
CO2: 25 mmol/L (ref 22–32)
Calcium: 8.9 mg/dL (ref 8.9–10.3)
Chloride: 102 mmol/L (ref 98–111)
Creatinine, Ser: 1.25 mg/dL — ABNORMAL HIGH (ref 0.44–1.00)
GFR, Estimated: 46 mL/min — ABNORMAL LOW (ref >60)
Glucose, Bld: 100 mg/dL — ABNORMAL HIGH (ref 70–99)
Potassium: 4.6 mmol/L (ref 3.5–5.1)
Sodium: 134 mmol/L — ABNORMAL LOW (ref 135–145)
Total Bilirubin: 0.7 mg/dL (ref 0.3–1.2)
Total Protein: 7.2 g/dL (ref 6.5–8.1)

## 2022-08-22 LAB — GLUCOSE, CAPILLARY
Glucose-Capillary: 122 mg/dL — ABNORMAL HIGH (ref 70–99)
Glucose-Capillary: 87 mg/dL (ref 70–99)
Glucose-Capillary: 121 mg/dL — ABNORMAL HIGH (ref 70–99)
Glucose-Capillary: 136 mg/dL — ABNORMAL HIGH (ref 70–99)

## 2022-08-22 MED ORDER — SODIUM CHLORIDE 0.9 % IV SOLN: INTRAVENOUS | Status: DC

## 2022-08-22 NOTE — [place] FL2 (Signed)
[place] MEDICAID FL2 LEVEL OF CARE FORM     IDENTIFICATION  Patient Name: [name] Birthdate: 15-Sep-1950 Sex: female Admission Date (Current Location): 08/18/2022  [facility] and [place] Number:  Producer, television/film/video and Address:  [facility],  [address] [place], [place] [address]      Provider Number: [id number]  Attending Physician Name and Address:  Beverely Low, MD  Relative Name and Phone Number:  Dallas Schimke [phone]    Current Level of Care: Hospital Recommended Level of Care: Skilled Nursing Facility Prior Approval Number:    Date Approved/Denied:   PASRR Number: Pending  Discharge Plan: SNF    Current Diagnoses: Patient Active Problem List   Diagnosis Date Noted   H/O total shoulder replacement, left 08/18/2022   Scalp cyst 08/10/2022   Right-sided chest pain 08/06/2022   CKD (chronic kidney disease) stage 3, GFR 30-59 ml/min (HCC) 07/03/2022   Skin tag 07/03/2022   Itchy scalp 07/03/2022   Preop examination 07/03/2022   Acute pain of left shoulder due to trauma 04/30/2022   Left-sided chest pain 04/27/2022   Sore throat 03/12/2022   Encounter for Medicare annual examination with abnormal findings 03/09/2022   Encounter for medication review and counseling 01/31/2022   Rhinitis 01/16/2022   Bilateral leg edema 12/21/2021   Hypoglycemia 12/20/2021   Skin lesions, generalized 12/19/2021   Insomnia due to stress 12/19/2021   Major depressive disorder 12/11/2021   Anemia in chronic kidney disease (CKD) 10/12/2021   Subconjunctival hemorrhage of left eye 10/02/2021   Dehydration 09/25/2021   Encounter for support and coordination of transition of care 09/25/2021   Acute renal failure superimposed on stage 3a chronic kidney disease (HCC) 09/23/2021   AKI (acute kidney injury) (HCC) 09/16/2021   CHF (congestive heart failure) (HCC) 09/16/2021   Abnormal EKG 09/16/2021   Urinary frequency 09/01/2021   Anemia of chronic disease  08/10/2021   Generalized pain 08/04/2021   Sciatica, right side 07/19/2021   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/10/2021   Atherosclerosis of aorta (HCC) 07/10/2021   Generalized weakness 05/12/2021   Advanced care planning/counseling discussion 12/23/2020   Head trauma, initial encounter 12/20/2020   Trigger finger, right 12/12/2020   Confusion 11/24/2020   Blurry vision 11/18/2020   Cervical radiculopathy 07/09/2020   Closed fracture of lateral malleolus of right fibula 02/27/2020   Headache 02/19/2020   Tubular adenoma of colon 02/09/2019   Benign neoplasm of cerebral meninges (HCC) 11/12/2018   Benign meningioma of brain (HCC) 10/31/2018   Osteoarthritis of both hips 07/06/2018   Lipoma of extremity 12/31/2017   Impingement syndrome of left shoulder region 12/27/2017   Encounter for examination following treatment at hospital 11/25/2017   Weakness generalized    Leg weakness, bilateral 10/28/2017   Lumbar spondylosis with myelopathy 10/12/2017   Numbness of hand 10/10/2017   Chronic neck pain 07/25/2017   Chronic pain syndrome 07/25/2017   At risk for cardiovascular event 06/10/2017   Acute metabolic encephalopathy 10/15/2016   Diabetic polyneuropathy associated with type 2 diabetes mellitus (HCC) 05/19/2016   Tobacco user 04/24/2016   Obesity (BMI 30.0-34.9) 04/24/2016   History of palpitations 08/09/2015   Labile hypertension 08/03/2015   Normal coronary arteries 08/03/2015   Dizziness 07/15/2015   Left-sided low back pain with left-sided sciatica 06/27/2015   Multinodular goiter 05/06/2015   Rectocele, female 04/27/2015   Anal sphincter incontinence 04/27/2015   Pelvic relaxation due to rectocele 03/30/2015  Hyperkalemia 02/22/2015   Pulmonary hypertension (HCC) 02/22/2015   Posterior chest pain 02/21/2015   Episodic cigarette smoking dependence 01/11/2015   Migraine without aura and without status  migrainosus, not intractable 07/02/2014   Flatulence 02/18/2014   Microcytic anemia 02/18/2014   Hip pain, right 12/17/2013   COPD mixed type (HCC) 09/16/2013   Acquired hypothyroidism 08/16/2013   Gastroparesis 04/28/2013   Nicotine dependence 03/09/2013   Seizure disorder (HCC) 01/19/2013   Displacement of cervical intervertebral disc without myelopathy 12/13/2012   Bursitis of shoulder 12/13/2012   Cervical disc disorder with radiculopathy of cervical region 10/31/2012   Solitary pulmonary nodule 08/19/2012   Anemia 07/05/2012   Hypersomnia disorder related to a known organic factor 06/11/2012   Pruritus 04/18/2012   Meningioma (HCC) 11/19/2011   Mononeuritis leg 10/25/2011   Carpal tunnel syndrome of right wrist 05/23/2011   Chronic pain of right hand 05/04/2011   Polypharmacy 04/28/2011   Mood disorder (HCC) 04/28/2011   Constipation 04/13/2011   Recurrent falls 12/12/2010   Oropharyngeal dysphagia 07/12/2010   Urinary incontinence 12/16/2009   HEARING LOSS 10/26/2009   DMII (diabetes mellitus, type 2) (HCC) 07/07/2009   Mixed hyperlipidemia 12/11/2008   IBS 12/11/2008   GERD 07/29/2008   MILK PRODUCTS ALLERGY 07/29/2008   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/03/2007   Essential hypertension 06/27/2007   Backache 06/19/2007   Osteoporosis 06/19/2007   Obstructive sleep apnea 06/19/2007   TRIGGER FINGER 04/18/2007   DIVERTICULOSIS, COLON 11/13/2006    Orientation RESPIRATION BLADDER Height & Weight     Self  Normal Incontinent Weight: 173 lb 11.6 oz (78.8 kg) Height:  4\' 11"  (149.9 cm)  BEHAVIORAL SYMPTOMS/MOOD NEUROLOGICAL BOWEL NUTRITION STATUS      Incontinent Diet (see discharge summary)  AMBULATORY STATUS COMMUNICATION OF NEEDS Skin   Limited Assist Verbally Surgical wounds                       Personal Care Assistance Level of Assistance  Bathing, Feeding, Dressing Bathing Assistance: Maximum assistance Feeding  assistance: Limited assistance Dressing Assistance: Maximum assistance     Functional Limitations Info  Sight, Hearing, Speech Sight Info: Impaired Hearing Info: Adequate Speech Info: Adequate    SPECIAL CARE FACTORS FREQUENCY  PT (By licensed PT), OT (By licensed OT)     PT Frequency: 5x/wk OT Frequency: 5x/wk            Contractures Contractures Info: Not present    Additional Factors Info  Code Status, Allergies, Psychotropic Code Status Info: FULL Allergies Info: Iron, Milk (Cow), Penicillins, Phenazopyridine, Cephalexin, Flonase (Fluticasone), Milk-related Compounds Psychotropic Info: See MAR         Current Medications (08/22/2022):  This is the current hospital active medication list Current Facility-Administered Medications  Medication Dose Route Frequency Provider Last Rate Last Admin   0.9 %  sodium chloride infusion   Intravenous Continuous Beverely Low, MD   Held at 08/18/22 1752   acetaminophen (TYLENOL) tablet 325-650 mg  325-650 mg Oral Q6H PRN Beverely Low, MD   325 mg at 08/22/22 0218   acetaminophen (TYLENOL) tablet 650 mg  650 mg Oral Q6H PRN Beverely Low, MD   650 mg at 08/20/22 0639   acidophilus (RISAQUAD) capsule 2 capsule  2 capsule Oral TID Beverely Low, MD   2 capsule at 08/22/22 1046   albuterol (PROVENTIL) (2.5 MG/3ML) 0.083% nebulizer solution 3 mL  3 mL Inhalation Q6H PRN Beverely Low, MD  amLODipine (NORVASC) tablet 10 mg  10 mg Oral Daily Beverely Low, MD   10 mg at 08/22/22 1047   ascorbic acid (VITAMIN C) tablet 500 mg  500 mg Oral Daily Beverely Low, MD   500 mg at 08/22/22 1046   aspirin EC tablet 81 mg  81 mg Oral Q breakfast Beverely Low, MD   81 mg at 08/22/22 0816   bisacodyl (DULCOLAX) suppository 10 mg  10 mg Rectal Daily PRN Beverely Low, MD       buPROPion (WELLBUTRIN XL) 24 hr tablet 150 mg  150 mg Oral Daily Beverely Low, MD   150 mg at 08/22/22 1049   calcium-vitamin D (OSCAL WITH D) 500-5 MG-MCG per tablet 1  tablet  1 tablet Oral Q breakfast Beverely Low, MD   1 tablet at 08/22/22 0816   cholecalciferol (VITAMIN D3) 25 MCG (1000 UNIT) tablet 1,000 Units  1,000 Units Oral Daily Beverely Low, MD   1,000 Units at 08/22/22 1048   cycloSPORINE (RESTASIS) 0.05 % ophthalmic emulsion 1 drop  1 drop Both Eyes BID Beverely Low, MD   1 drop at 08/22/22 1048   docusate sodium (COLACE) capsule 100 mg  100 mg Oral BID Beverely Low, MD   100 mg at 08/22/22 1047   doxycycline (VIBRA-TABS) tablet 100 mg  100 mg Oral BID Beverely Low, MD   100 mg at 08/22/22 1049   ezetimibe (ZETIA) tablet 10 mg  10 mg Oral Daily Beverely Low, MD   10 mg at 08/22/22 1049   hydrALAZINE (APRESOLINE) tablet 25 mg  25 mg Oral Q6H PRN [name], MD   25 mg at 08/19/22 1650   hydrOXYzine (ATARAX) tablet 10 mg  10 mg Oral Daily PRN Beverely Low, MD   10 mg at 08/22/22 0218   insulin aspart (novoLOG) injection 0-15 Units  0-15 Units Subcutaneous TID [facility] Beverely Low, MD   2 Units at 08/22/22 0818   insulin aspart (novoLOG) injection 0-5 Units  0-5 Units Subcutaneous QHS Beverely Low, MD   3 Units at 08/18/22 2228   insulin aspart (novoLOG) injection 4 Units  4 Units Subcutaneous TID WC Beverely Low, MD   4 Units at 08/22/22 1210   insulin glargine-yfgn (SEMGLEE) injection 20 Units  20 Units Subcutaneous QHS Beverely Low, MD   20 Units at 08/21/22 2131   irbesartan (AVAPRO) tablet 75 mg  75 mg Oral Daily Beverely Low, MD   75 mg at 08/22/22 1046   lamoTRIgine (LAMICTAL) tablet 100 mg  100 mg Oral BID Beverely Low, MD   100 mg at 08/22/22 1048   levothyroxine (SYNTHROID) tablet 25 mcg  25 mcg Oral Once per day on Sun Norris, Steve, MD   25 mcg at 08/20/22 1610   levothyroxine (SYNTHROID) tablet 50 mcg  50 mcg Oral Once per day on Mon Tue Wed Thu Fri Sat Beverely Low, MD   50 mcg at 08/22/22 9604   loratadine (CLARITIN) tablet 10 mg  10 mg Oral Daily PRN Beverely Low, MD       menthol-cetylpyridinium (CEPACOL) lozenge 3 mg  1  lozenge Oral PRN Beverely Low, MD       Or   phenol (CHLORASEPTIC) mouth spray 1 spray  1 spray Mouth/Throat PRN Beverely Low, MD       metoCLOPramide (REGLAN) tablet 5-10 mg  5-10 mg Oral Q8H PRN Beverely Low, MD       Or   metoCLOPramide (REGLAN) injection 5-10 mg  5-10 mg  Intravenous Q8H PRN Beverely Low, MD       metoprolol tartrate (LOPRESSOR) tablet 50 mg  50 mg Oral BID Beverely Low, MD   50 mg at 08/22/22 1046   mirabegron ER (MYRBETRIQ) tablet 25 mg  25 mg Oral Daily Beverely Low, MD   25 mg at 08/22/22 1048   montelukast (SINGULAIR) tablet 10 mg  10 mg Oral Daily Beverely Low, MD   10 mg at 08/22/22 1047   multivitamin with minerals tablet 1 tablet  1 tablet Oral Daily Beverely Low, MD   1 tablet at 08/22/22 1047   nicotine (NICODERM CQ - dosed in mg/24 hr) patch 21 mg  21 mg Transdermal Daily PRN Beverely Low, MD       nystatin (MYCOSTATIN/NYSTOP) topical powder 1 Application  1 Application Topical BID PRN Beverely Low, MD       omega-3 acid ethyl esters (LOVAZA) capsule 1 g  1 g Oral Daily Beverely Low, MD   1 g at 08/22/22 1047   ondansetron (ZOFRAN) tablet 4 mg  4 mg Oral Q6H PRN Beverely Low, MD       Or   ondansetron [facility]) injection 4 mg  4 mg Intravenous Q6H PRN Beverely Low, MD   4 mg at 08/20/22 1310   Oral care mouth rinse  15 mL Mouth Rinse PRN Beverely Low, MD       oxyCODONE (Oxy IR/ROXICODONE) immediate release tablet 5-10 mg  5-10 mg Oral Q4H PRN Beverely Low, MD   10 mg at 08/21/22 2243   pantoprazole (PROTONIX) EC tablet 40 mg  40 mg Oral Daily Beverely Low, MD   40 mg at 08/22/22 1046   polyethylene glycol (MIRALAX / GLYCOLAX) packet 17 g  17 g Oral Daily PRN Beverely Low, MD       potassium chloride (KLOR-CON M) CR tablet 10 mEq  10 mEq Oral BID Beverely Low, MD   10 mEq at 08/22/22 1047   risperiDONE (RISPERDAL) tablet 0.5 mg  0.5 mg Oral Wendi Maya, MD   0.5 mg at 08/21/22 2132   rosuvastatin (CRESTOR) tablet 5 mg  5 mg Oral Wendi Maya, MD   5 mg at 08/21/22 2132   sertraline (ZOLOFT) tablet 100 mg  100 mg Oral Daily Beverely Low, MD   100 mg at 08/22/22 1051   spironolactone (ALDACTONE) tablet 50 mg  50 mg Oral Daily Beverely Low, MD   50 mg at 08/22/22 1046   vitamin E capsule 400 Units  400 Units Oral Daily Beverely Low, MD   400 Units at 08/22/22 1049     Discharge Medications: Please see discharge summary for a list of discharge medications.  Relevant Imaging Results:  Relevant Lab Results:   Additional Information SSN: [id number]  Otelia Santee, LCSW

## 2022-08-22 NOTE — Progress Notes (Signed)
Physical Therapy Treatment Patient Details Name: [name] MRN: [id number] DOB: 12/14/1950 Today's Date: 08/22/2022   History of Present Illness Patient is a 72 year old female who presented on 6/7 for elective left shoulder arthroplasty. Post op patient was having difficulty with waking up thus transitioned to SDU. Patient also noted to have HTN. PMH: anxiety, brain tumor, cancer, glaucoma, major depression, narcolepsy, seizures, stroke with L side weakness, back surgery, craniotomy, carpal tunnel release L side,    PT Comments    Pt awake in bed upon arrival, continues to have flat affect. Pt follows commands, needing increased time and cues with mobility to maintain post-op shoulder precautions. Pt needing min A to come to sitting EOB, increased time and effort with RUE on bedrail. Pt performs STS transfers with min A+2 for safety, verbal cues to avoid trunk lean onto L elbow to power up. Pt tolerates increased amb distance, but still needing +2 for safety and min A to steady with general unsteadiness. Attempted pt utilizing RW for more familiar gait with therapist managing L upright but unsuccessful so returned to [facility]. Pt is a high fall risk, moments of needing increased cues and unsure if [facility] or cognition.    Recommendations for follow up therapy are one component of a multi-disciplinary discharge planning process, led by the attending physician.  Recommendations may be updated based on patient status, additional functional criteria and insurance authorization.  Follow Up Recommendations       Assistance Recommended at Discharge Frequent or constant Supervision/Assistance  Patient can return home with the following A little help with walking and/or transfers;A little help with bathing/dressing/bathroom;Assistance with cooking/housework;Assist for transportation   Equipment Recommendations  None recommended by PT    Recommendations for Other Services       Precautions /  Restrictions Precautions Precautions: Shoulder Type of Shoulder Precautions: no ROM shoulder, ok for hand wrist and elbow AROM, Shoulder Interventions: Shoulder sling/immobilizer;At all times;Off for dressing/bathing/exercises Precaution Booklet Issued: Yes (comment) Restrictions Weight Bearing Restrictions: Yes LUE Weight Bearing: Non weight bearing     Mobility  Bed Mobility Overal bed mobility: Needs Assistance Bed Mobility: Supine to Sit     Supine to sit: Min assist     General bed mobility comments: verbal cues for no LUE use to come to sitting EOB, increased time and effort to upright trunk and scoot out to EOB    Transfers Overall transfer level: Needs assistance Equipment used: 1 person hand held assist Transfers: Sit to/from Stand Sit to Stand: Min assist, +2 safety/equipment           General transfer comment: min A+2 for safety with STS, heavy verbal cues to avoid leaning to L into elbow, assist to steady and weight shift forward with transfer    Ambulation/Gait Ambulation/Gait assistance: Min assist, +2 safety/equipment Gait Distance (Feet): 24 Feet (+12 ft, 10 ft) Assistive device: 1 person hand held assist Gait Pattern/deviations: Step-to pattern, Decreased stride length, Shuffle Gait velocity: decreased     General Gait Details: Pt amb 12 ft, then 24 ft then 10 ft with single HHA and +2 for safety, increased lateral weight shifting, maintains normal BOS, decreased heel-toe pattern wtih flat foot posture, increased hesitancy with amb towards hallway and unsure if pt distracted or nervous, pt unable to articulate decrease in step length and requests to sit down in recliner   Stairs             Wheelchair Mobility    Modified Rankin (  Stroke Patients Only)       Balance Overall balance assessment: Needs assistance Sitting-balance support: Feet supported, Single extremity supported Sitting balance-Leahy Scale: Fair     Standing balance  support: Reliant on assistive device for balance, During functional activity, Single extremity supported Standing balance-Leahy Scale: Poor                              Cognition Arousal/Alertness: Awake/alert Behavior During Therapy: Flat affect Overall Cognitive Status: Difficult to assess                                 General Comments: pt maintains eyes opened throughout session, sometimes responds to questions propmtly and other times needing increasead time or repeating question, unsure if [facility] or cognition?        Exercises      General Comments        Pertinent Vitals/Pain Pain Assessment Pain Assessment: Faces Faces Pain Scale: Hurts little more Pain Location: L shoulder Pain Descriptors / Indicators: Grimacing, Guarding Pain Intervention(s): Limited activity within patient's tolerance, Monitored during session, Repositioned, Ice applied    Home Living                          Prior Function            PT Goals (current goals can now be found in the care plan section) Acute Rehab PT Goals Patient Stated Goal: go to the bathroom PT Goal Formulation: With patient Time For Goal Achievement: 09/04/22 Potential to Achieve Goals: Good Progress towards PT goals: Progressing toward goals    Frequency    Min 1X/week      PT Plan Current plan remains appropriate    Co-evaluation PT/OT/SLP Co-Evaluation/Treatment: Yes Reason for Co-Treatment: Necessary to address cognition/behavior during functional activity;For patient/therapist safety;To address functional/ADL transfers PT goals addressed during session: Mobility/safety with mobility;Balance;Proper use of DME        AM-PAC PT "6 Clicks" Mobility   Outcome Measure  Help needed turning from your back to your side while in a flat bed without using bedrails?: A Little Help needed moving from lying on your back to sitting on the side of a flat bed without using  bedrails?: A Little Help needed moving to and from a bed to a chair (including a wheelchair)?: A Little Help needed standing up from a chair using your arms (e.g., wheelchair or bedside chair)?: A Little Help needed to walk in hospital room?: A Lot Help needed climbing 3-5 steps with a railing? : Total 6 Click Score: 15    End of Session Equipment Utilized During Treatment: Gait belt Activity Tolerance: Patient tolerated treatment well Patient left: in chair;with call bell/phone within reach;with chair alarm set;with nursing/sitter in room Nurse Communication: Mobility status PT Visit Diagnosis: Muscle weakness (generalized) (M62.81);Other abnormalities of gait and mobility (R26.89);Pain Pain - Right/Left: Left Pain - part of body: Shoulder     Time: 4098-1191 PT Time Calculation (min) (ACUTE ONLY): 26 min  Charges:  $Gait Training: 8-22 mins                      Tori Shera Laubach PT, DPT 08/22/22, 3:12 PM

## 2022-08-22 NOTE — Progress Notes (Signed)
30 Day PASRR Note   Patient Details  Name: [name] Date of Birth: [birth year]   Transition of Care [facility]) CM/SW Contact:    Otelia Santee, LCSW Phone Number: 08/22/2022, 12:46 PM  To Whom It May Concern:  Please be advised that this patient will require a short-term nursing home stay - anticipated 30 days or less for rehabilitation and strengthening.   The plan is for return home.

## 2022-08-22 NOTE — TOC Progression Note (Addendum)
Transition of Care [facility]) - Progression Note    Patient Details  Name: [name] MRN: [id number] Date of Birth: 1950/12/26  Transition of Care [facility]) CM/SW Contact  Otelia Santee, LCSW Phone Number: 08/22/2022, 1:02 PM  Clinical Narrative:    Pt recommended for SNF placement. Pt's son agreeable to this plan and prefers for facility to be in/near [place].  Pt's PASRR is currently at a level 2 review. Have faxed in additional documents for review.  Pt currently not medically ready for SNF. Referrals for placement will be sent closer to when pt is medically ready.   Addendum 2:30pm: Pt's PASRR returned:  [id number] E. PASRR placed on FL-2.   Expected Discharge Plan: Home/Self Care Barriers to Discharge: Continued Medical Work up  Expected Discharge Plan and Services In-house Referral: NA Discharge Planning Services: NA Post Acute Care Choice: NA Living arrangements for the past 2 months: Apartment                 DME Arranged: N/A DME Agency: NA                   Social Determinants of Health (SDOH) Interventions SDOH Screenings   Food Insecurity: No Food Insecurity (08/20/2022)  Housing: Patient Declined (08/20/2022)  Transportation Needs: No Transportation Needs (08/20/2022)  Utilities: Not At Risk (08/20/2022)  Alcohol Screen: Low Risk  (07/17/2022)  Depression (PHQ2-9): Low Risk  (08/03/2022)  Recent Concern: Depression (PHQ2-9) - High Risk (05/25/2022)  Financial Resource Strain: Low Risk  (07/17/2022)  Physical Activity: Insufficiently Active (07/17/2022)  Social Connections: Moderately Integrated (07/17/2022)  Stress: No Stress Concern Present (07/17/2022)  Tobacco Use: Medium Risk (08/18/2022)    Readmission Risk Interventions    08/21/2022   11:54 AM 09/16/2021   10:07 AM  Readmission Risk Prevention Plan  Transportation Screening Complete Complete  Medication Review [employer]) Complete Complete  PCP or Specialist appointment within 3-5 days of discharge  Complete   HRI or Home Care Consult Complete Complete  SW Recovery Care/Counseling Consult Complete Complete  Palliative Care Screening Not Applicable Not Applicable  Skilled Nursing Facility Not Applicable Patient Refused

## 2022-08-22 NOTE — Progress Notes (Signed)
Occupational Therapy Treatment Patient Details Name: JADAMARIE FAINE MRN: [id number] DOB: January 04, 1951 Today's Date: 08/22/2022   History of present illness Patient is a 72 year old female who presented on 6/7 for elective left shoulder arthroplasty. Post op patient was having difficulty with waking up thus transitioned to SDU. Patient also noted to have HTN. PMH: anxiety, brain tumor, cancer, glaucoma, major depression, narcolepsy, seizures, stroke with L side weakness, back surgery, craniotomy, carpal tunnel release L side,   OT comments  Patient was more alert today with eyes open for most of the session. Patient continues to need increased time and cues to process each task. Patient with increased pain in L shoulder with continued decreased control over hand wrist and elbow. Patient was able to move digits with increased time. Patient continued to need +2 for time on feet with increased instability and poor safety awareness.patient is unable to maintain shoulder restrictions without consistent cues. Current d/c location for SNF short term rehab. Patient's discharge plan remains appropriate at this time. OT will continue to follow acutely.     Recommendations for follow up therapy are one component of a multi-disciplinary discharge planning process, led by the attending physician.  Recommendations may be updated based on patient status, additional functional criteria and insurance authorization.    Assistance Recommended at Discharge Frequent or constant Supervision/Assistance  Patient can return home with the following  A lot of help with bathing/dressing/bathroom;A lot of help with walking and/or transfers;Assistance with cooking/housework;Direct supervision/assist for medications management;Assist for transportation;Help with stairs or ramp for entrance;Direct supervision/assist for financial management   Equipment Recommendations  None recommended by OT       Precautions / Restrictions  Precautions Precautions: Shoulder Type of Shoulder Precautions: no ROM shoulder, ok for hand wrist and elbow AROM, Shoulder Interventions: Shoulder sling/immobilizer;At all times;Off for dressing/bathing/exercises Precaution Booklet Issued: Yes (comment) Restrictions Weight Bearing Restrictions: Yes LUE Weight Bearing: Non weight bearing       Mobility Bed Mobility Overal bed mobility: Needs Assistance Bed Mobility: Supine to Sit     Supine to sit: Min assist     General bed mobility comments: verbal cues for no LUE use to come to sitting EOB, increased time and effort to upright trunk and scoot out to EOB           Balance Overall balance assessment: Needs assistance Sitting-balance support: Feet supported, Single extremity supported Sitting balance-Leahy Scale: Fair Sitting balance - Comments: leaning posterioly and to R side with fatigue needed physical A to maintain sitting balance.   Standing balance support: Reliant on assistive device for balance, During functional activity, Single extremity supported Standing balance-Leahy Scale: Poor                             ADL either performed or assessed with clinical judgement   ADL Overall ADL's : Needs assistance/impaired       Toilet Transfer: Stand-pivot;BSC/3in1;Minimal assistance Toilet Transfer Details (indicate cue type and reason): with HHA and increased cues. to bathroom with patient noted to let go of therapist and reach out to all other objects in room as she neared them. patient was not responding to using RW to old onto. patient noted to sawy side to side to walk with some almost hip thrusting forwards with fatigue. Toileting- Clothing Manipulation and Hygiene: Maximal assistance;Sit to/from stand Toileting - Clothing Manipulation Details (indicate cue type and reason): unable to maintain balance and participate in  hygiene or clothing management tasks.       General ADL Comments: patient  unable to engage in LUE exercises with increased time and encouragement. pain and current cognitive level limiting patient.    Extremity/Trunk Assessment Upper Extremity Assessment LUE Deficits / Details: able to wiggle digits, move forearm, patient continues to be unable to follow commands to complete LUE exercises seated in recliner. patient noted to have some attempt to participate in elbow movements with gentle AAROM.       Cervical / Trunk Assessment Cervical / Trunk Assessment: Kyphotic     Cognition Arousal/Alertness: Awake/alert Behavior During Therapy: Flat affect Overall Cognitive Status: Difficult to assess               General Comments: patient was more alert during session with continued confusion. patient was able to answer some questions appropirately but others with increased time or repeating questions. patient with eyes open during majority of session today.                   Pertinent Vitals/ Pain       Pain Assessment Pain Assessment: Faces Faces Pain Scale: Hurts little more Pain Location: L shoulder Pain Descriptors / Indicators: Grimacing, Guarding Pain Intervention(s): Limited activity within patient's tolerance, Monitored during session, Repositioned, Ice applied         Frequency  Min 1X/week        Progress Toward Goals  OT Goals(current goals can now be found in the care plan section)  Progress towards OT goals: Progressing toward goals     Plan Discharge plan remains appropriate    Co-evaluation    PT/OT/SLP Co-Evaluation/Treatment: Yes Reason for Co-Treatment: Necessary to address cognition/behavior during functional activity;For patient/therapist safety;To address functional/ADL transfers PT goals addressed during session: Mobility/safety with mobility;Balance;Proper use of DME OT goals addressed during session: ADL's and self-care      AM-PAC OT "6 Clicks" Daily Activity     Outcome Measure   Help from another  person eating meals?: A Little Help from another person taking care of personal grooming?: A Little Help from another person toileting, which includes using toliet, bedpan, or urinal?: A Lot Help from another person bathing (including washing, rinsing, drying)?: A Lot Help from another person to put on and taking off regular upper body clothing?: A Lot Help from another person to put on and taking off regular lower body clothing?: A Lot 6 Click Score: 14    End of Session Equipment Utilized During Treatment: Other (comment);Gait belt (sling)  OT Visit Diagnosis: Unsteadiness on feet (R26.81);Other abnormalities of gait and mobility (R26.89);Pain Pain - Right/Left: Left Pain - part of body: Shoulder   Activity Tolerance Patient limited by fatigue;Patient limited by lethargy   Patient Left with call bell/phone within reach;in chair;with chair alarm set   Nurse Communication Mobility status        Time: 1610-9604 OT Time Calculation (min): 27 min  Charges: OT General Charges $OT Visit: 1 Visit OT Treatments $Self Care/Home Management : 8-22 mins  Rosalio Loud, MS Acute Rehabilitation Department Office# [phone]   Selinda Flavin 08/22/2022, 4:31 PM

## 2022-08-22 NOTE — Progress Notes (Signed)
   08/22/22 2248  BiPAP/CPAP/SIPAP  $ Non-Invasive Home Ventilator  Initial  $ Face Mask Medium Yes  BiPAP/CPAP/SIPAP Pt Type Adult  BiPAP/CPAP/SIPAP DREAMSTATIOND  Mask Type Full face mask (per home regimen)  Mask Size Medium  Respiratory Rate 16 breaths/min  FiO2 (%) 21 %  Patient Home Equipment No  Auto Titrate Yes (automode, min4cm, max20cm h2o)  CPAP/SIPAP surface wiped down Yes  BiPAP/CPAP /SiPAP Vitals  Pulse Rate 69  Resp 16  SpO2 93 %

## 2022-08-22 NOTE — Consult Note (Signed)
[facility] Consultation Note  [name] ZOX:[id number] DOB: 1951-02-23 DOA: 08/18/2022  PCP: Kerri Perches, MD   Requesting Physician: Dr. Ranell Patrick   Reason for Consultation: Medical Management of HTN  HPI: [name] is a 72 y.o. female with medical history significant for hypertension, bipolar disorder, insulin-dependent type 2 diabetes who was admitted to the hospital after elective left shoulder replacement 6/7 with Dr. Devonne Doughty of orthopedic surgery, who is requesting medical consultation due to agitation, somnolence, and concern for polypharmacy.  She was initially seen by the hospitalist service due to difficult to control hypertension, this improved and went back to baseline once her home medications were resumed, and she was also given as needed clonidine.  She was seen by the hospitalist yesterday, and they signed off.  Today, her surgeon Dr. Devonne Doughty is requesting hospitalist evaluation due to concerns for polypharmacy and somnolence.  I also discussed with the patient's bedside nurse, who took care of her over the weekend and again today.  He states that over the weekend she was doing quite well, awake alert oriented and interactive.  Last evening per report, she had a difficult night, with agitation, delirium, and did not sleep overnight.  This morning, she slept most of the morning until about noon.  On my evaluation of the patient this afternoon, she seems to be resting comfortably, asleep on my arrival and arousable.  She remains somewhat sleepy, answers questions appropriately but falls back asleep.  She denies any nausea, significant pain, fevers, chills, abdominal pain. Family at bedside.  Review of Systems: Please see HPI for pertinent positives and negatives. A complete 10 system review of systems are otherwise negative.  Past Medical History:  Diagnosis Date   Allergy    Anemia    Anemia in chronic kidney disease (CKD) 08/10/2021   Anemia in chronic kidney  disease (CKD) 10/12/2021   Anxiety    takes Ativan daily   Arthritis    Assistance needed for mobility    Bipolar disorder (HCC)    takes Risperdal nightly   Blood transfusion    Brain tumor (HCC)    Cancer (HCC)    In her gum   Carpal tunnel syndrome of right wrist 05/23/2011   Cervical disc disorder with radiculopathy of cervical region 10/31/2012   Chronic back pain    Chronic idiopathic constipation    Chronic neck and back pain    Colon polyps    COPD (chronic obstructive pulmonary disease) with chronic bronchitis 09/16/2013   Office Spirometry 10/30/2013-submaximal effort based on appearance of loop and curve. Numbers would fit with severe restriction but her physiologic capability may be better than this. FVC 0.91/44%, and 10.74/45%, FEV1/FVC 0.81, FEF 25-75% 1.43/69%     Diabetes mellitus    Type II   Diverticulosis    TCS 9/08 by Dr. Lina Sar for diarrhea . Bx for micro scopic colitis negative.    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   Metabolic encephalopathy 08/03/2011   Migraines    chronic headaches   Mononeuritis lower limb    Narcolepsy    Osteoporosis    Pancreatitis 2006   due to Depakote with normal EUS    Paralysis (HCC)    Pneumonia    Schatzki's ring    non critical / EGD with ED 8/2011with RMR   Seizures (HCC)    takes Lamictal daily.Last seizure 3 yrs ago   Sleep apnea    on CPAP   Small bowel obstruction (HCC)    Stroke (HCC)    left sided weakness, speech  changes   Tubular adenoma of colon    Past Surgical History:  Procedure Laterality Date   ABDOMINAL HYSTERECTOMY  1978   BACK SURGERY  July 2012   BACTERIAL OVERGROWTH TEST N/A 05/05/2013   Procedure: BACTERIAL OVERGROWTH TEST;  Surgeon: Corbin Ade, MD;  Location: [facility];  Service: Endoscopy;  Laterality: N/A;  7:30   BIOPSY THYROID  2009   BRAIN SURGERY  11/2011   resection of meningioma   BREAST REDUCTION SURGERY  1994   CARDIAC CATHETERIZATION  05/10/2005   normal coronaries, normal LV systolic function and EF (Dr. Evlyn Courier)   CARPAL TUNNEL RELEASE Left 07/22/04   Dr. Romeo Apple   CATARACT EXTRACTION Bilateral    CHOLECYSTECTOMY  1984   COLONOSCOPY N/A 09/25/2012   [id number] diverticulosis.  colonic polyp-removed : tubular adenoma   CRANIOTOMY  11/23/2011   Procedure: CRANIOTOMY TUMOR EXCISION;  Surgeon: Hewitt Shorts, MD;  Location: [facility];  Service: Neurosurgery;  Laterality: N/A;  Craniotomy for tumor resection   ESOPHAGOGASTRODUODENOSCOPY  12/29/2010   Rourk-Retained food in the esophagus and stomach, small hiatal hernia, status post Maloney dilation of the esophagus   ESOPHAGOGASTRODUODENOSCOPY N/A 09/25/2012   [id number] atonic baggy esophagus status post Maloney dilation 56 F. Hiatal hernia   GIVENS CAPSULE STUDY N/A 01/15/2013   NORMAL.    IR GENERIC HISTORICAL  03/17/2016   IR RADIOLOGIST EVAL & MGMT 03/17/2016 [facility]   LESION REMOVAL N/A 05/31/2015   Procedure: REMOVAL RIGHT AND LEFT LESIONS OF MANDIBLE;  Surgeon: Ocie Doyne, DDS;  Location: [facility] OR;  Service: Oral Surgery;  Laterality: N/A;   MALONEY DILATION  12/29/2010   RMR;   NM MYOCAR PERF WALL MOTION  2006   "relavtiely normal" persantine, mild anterior thinning (breast attenuation artifact), no region of scar/ischemia   OVARIAN CYST REMOVAL     RECTOCELE REPAIR N/A 06/29/2015   Procedure: POSTERIOR REPAIR (RECTOCELE);  Surgeon: Tilda Burrow, MD;  Location: [facility] ORS;  Service: Gynecology;   Laterality: N/A;   REDUCTION MAMMAPLASTY Bilateral    SPINE SURGERY  09/29/2010   Dr. Shon Baton   surgical excision of 3 tumors from right thigh and right buttock  and left upper thigh  2010   TOOTH EXTRACTION Bilateral 12/14/2014   Procedure: REMOVAL OF BILATERAL MANDIBULAR EXOSTOSES;  Surgeon: Ocie Doyne, DDS;  Location: [facility] OR;  Service: Oral Surgery;  Laterality: Bilateral;   TRANSTHORACIC ECHOCARDIOGRAM  2010   EF 60-65%, mild conc LVH, grade 1 diastolic dysfunction; mildly calcified MV annulus with mildly thickened leaflets, mildly calcified MR annulus    Social History:  reports that she quit smoking about 13 months ago. Her smoking use included cigarettes. She has a 23.50 pack-year smoking history. She has been exposed to tobacco smoke. She has never used smokeless tobacco.  She reports that she does not drink alcohol and does not use drugs.  Allergies  Allergen Reactions   Iron Itching and Nausea And Vomiting   Milk (Cow) Rash and Other (See Comments)    Doesn't agree with the stomach, also    Penicillins Hives   Phenazopyridine Hives   Cephalexin Hives   Flonase [Fluticasone] Other (See Comments)    "It gave me ulcers in my nose"   Milk-Related Compounds Nausea Only and Other (See Comments)    Doesn't agree with the stomach    Family History  Problem Relation Age of Onset   Heart attack Mother        HTN   Pneumonia Father    Kidney failure Father    Diabetes Father    Pancreatic cancer Sister    Cancer Sister        breast    Cancer Sister        pancreatic   Diabetes Brother    Hypertension Brother    Diabetes Brother    Alcohol abuse Maternal Uncle    Stroke Maternal Grandmother    Heart attack Maternal Grandfather    Hypertension Son    Sleep apnea Son    Colon cancer Neg Hx    Anesthesia problems Neg Hx    Hypotension Neg Hx    Malignant hyperthermia Neg Hx    Pseudochol deficiency Neg Hx    Breast cancer Neg Hx    Stomach cancer Neg Hx      Prior  to Admission medications   Medication Sig Start Date End Date Taking? Authorizing Provider  ACCU-CHEK GUIDE test strip USE TO CHECK BLOOD SUGAR FOUR TIMES A DAY AND AS NEEDED (NEED MD APPOINTMENT FOR ANY FURTHER REFILLS) 05/10/22  Yes Carlus Pavlov, MD  Accu-Chek Softclix Lancets lancets TEST BLOOD SUGAR THREE TIMES DAILY AS DIRECTED 03/28/21  Yes Carlus Pavlov, MD  acetaminophen (TYLENOL) 325 MG tablet Take 650 mg by mouth every 6 (six) hours as needed for mild pain, moderate pain or headache.   Yes [provider]  albuterol (VENTOLIN HFA) 108 (90 Base) MCG/ACT inhaler INHALE 1 TO 2 PUFFS EVERY 6 HOURS AS NEEDED FOR WHEEZING, SHORTNESS OF BREATH Patient taking differently: Inhale 1-2 puffs into the lungs every 6 (six) hours as needed for wheezing or shortness of breath. 06/24/21  Yes Young, Joni Fears D, MD  Alcohol Swabs (DROPSAFE ALCOHOL PREP) 70 % PADS USE TO CLEAN FINGER PRIOR TO TESTING FOR BLOOD SUGAR AS DIRECTED 05/10/22  Yes Carlus Pavlov, MD  alendronate (FOSAMAX) 70 MG tablet TAKE 1 TABLET EVERY 7 DAYS ON AN EMPTY STOMACH WITH A FULL GLASS OF WATER Patient taking differently: Take 70 mg by mouth once a week. 11/07/21  Yes Kerri Perches, MD  amLODipine (NORVASC) 10 MG tablet Take 1 tablet (10 mg total) by mouth daily. 08/14/22  Yes Kerri Perches, MD  ascorbic acid (VITAMIN C) 500 MG tablet Take 500 mg by mouth daily.   Yes [provider]  aspirin EC 81 MG tablet Take 1 tablet (81 mg total) by mouth daily with breakfast. 12/08/18  Yes Emokpae, Courage, MD  Blood Glucose Calibration (ACCU-CHEK GUIDE CONTROL) LIQD USE AS DIRECTED 12/13/20  Yes Carlus Pavlov, MD  blood glucose meter kit and supplies Dispense based on patient and insurance preference. Use up to four times daily as directed. (FOR ICD-10 E10.9, E11.9). 06/27/19  Yes Carlus Pavlov, MD  buPROPion (WELLBUTRIN XL) 150 MG 24 hr tablet Take 1  tablet (150 mg total) by mouth every morning. 07/06/22  07/06/23 Yes Myrlene Broker, MD  Calcium Carb-Cholecalciferol (CALTRATE 600+D3 SOFT) 600-20 MG-MCG CHEW Chew 1 tablet by mouth daily.   Yes [provider]  cetirizine (ZYRTEC) 10 MG tablet Take 1 tablet (10 mg total) by mouth daily. 04/25/21  Yes Paseda, Baird Kay, FNP  Cholecalciferol (D3 PO) Take 1 tablet by mouth daily.   Yes [provider]  clobetasol (TEMOVATE) 0.05 % external solution Apply 1 Application topically 2 (two) times daily. Patient taking differently: Apply 1 Application topically See admin instructions. Apply to affected area(s) 2 times a day 06/28/22  Yes Kerri Perches, MD  Docusate Sodium (DSS) 100 MG CAPS Take 100 mg by mouth in the morning and at bedtime.   Yes [provider]  doxycycline (VIBRA-TABS) 100 MG tablet Take 1 tablet (100 mg total) by mouth 2 (two) times daily for 10 days. 08/09/22 08/19/22 Yes Del Nigel Berthold, FNP  DROPLET PEN NEEDLES 31G X 8 MM MISC USE FOR INJECTING INSULIN 4 TIMES DAILY. 09/02/21  Yes Carlus Pavlov, MD  Elastic Bandages & Supports (ADJUSTABLE ARM SLING) MISC L arm sling 05/04/22  Yes Kerri Perches, MD  ezetimibe (ZETIA) 10 MG tablet TAKE 1 TABLET EVERY DAY Patient taking differently: Take 10 mg by mouth daily. 01/19/22  Yes Kerri Perches, MD  hydrOXYzine (ATARAX) 10 MG tablet Take one tablet by mouth once daily, as needed, for excessive itching 08/09/22  Yes Del Newman Nip, Norton, FNP  insulin aspart (NOVOLOG FLEXPEN) 100 UNIT/ML FlexPen Inject 5 Units into the skin 3 (three) times daily with meals. Give if eats 50% or more of meal. Patient taking differently: Inject 5 Units into the skin 3 (three) times daily with meals. Inject 5 units into the skin three times a day with meals and use only if 50% or more of a meal is eaten 07/21/22  Yes Carlus Pavlov, MD  lactobacillus acidophilus (BACID) TABS tablet Take 2 tablets by mouth 3 (three) times daily.   Yes [provider]   lamoTRIgine (LAMICTAL) 100 MG tablet Take 1 tablet (100 mg total) by mouth 2 (two) times daily. 07/06/22  Yes Myrlene Broker, MD  levothyroxine (SYNTHROID) 50 MCG tablet TAKE 1 TABLET DAILY SIX DAYS A WEEK AND 1/2 TABLET ON ONE DAY A WEEK Patient taking differently: Take 25-50 mcg by mouth See admin instructions. Take 25 mcg by mouth before breakfast on Sunday and 50 mcg on Mon/Tues/Wed/Thurs/Fri/Sat 01/30/22  Yes Carlus Pavlov, MD  LORazepam (ATIVAN) 0.5 MG tablet Take 1 tablet (0.5 mg total) by mouth 2 (two) times daily. 07/06/22  Yes Myrlene Broker, MD  metoprolol tartrate (LOPRESSOR) 50 MG tablet TAKE 1 TABLET TWICE DAILY (NEED MD APPOINTMENT) Patient taking differently: Take 50 mg by mouth 2 (two) times daily. 08/29/21  Yes Strader, Lennart Pall, PA-C  Misc. Devices (MATTRESS PAD) MISC FIRM MATTRESS PAD for hospital bed x 1 02/22/21  Yes Kerri Perches, MD  montelukast (SINGULAIR) 10 MG tablet TAKE 1 TABLET EVERY DAY Patient taking differently: Take 10 mg by mouth daily. 05/10/22  Yes Kerri Perches, MD  Multiple Vitamins-Minerals (MULTIVITAMIN GUMMIES ADULT PO) Take 1 tablet by mouth daily.   Yes [provider]  MYRBETRIQ 25 MG TB24 tablet TAKE 1 TABLET EVERY DAY 05/10/22  Yes Kerri Perches, MD  nicotine (NICODERM CQ) 21 mg/24hr patch Place 1 patch (21 mg total) onto the skin daily. 06/20/22  Yes Young,  Rennis Chris, MD  nystatin (MYCOSTATIN/NYSTOP) powder Apply 1 Application topically 2 (two) times daily.   Yes [provider]  Omega-3 Fatty Acids (FISH OIL PO) Take 1 capsule by mouth daily.   Yes [provider]  pregabalin (LYRICA) 75 MG capsule Take 1 capsule (75 mg total) by mouth daily. 10/20/16  Yes Elliot Cousin, MD  RABEprazole (ACIPHEX) 20 MG tablet TAKE 1 TABLET TWICE DAILY 11/09/21  Yes Pyrtle, Carie Caddy, MD  RESTASIS 0.05 % ophthalmic emulsion Place 2 drops into both eyes daily. 07/29/20  Yes [provider]  risperiDONE (RISPERDAL) 0.5  MG tablet Take 1 tablet (0.5 mg total) by mouth at bedtime. 07/06/22  Yes Myrlene Broker, MD  rosuvastatin (CRESTOR) 5 MG tablet TAKE 1 TABLET AT BEDTIME 06/12/22  Yes Kerri Perches, MD  sertraline (ZOLOFT) 100 MG tablet Take 1 tablet (100 mg total) by mouth every morning. 07/06/22  Yes Myrlene Broker, MD  traZODone (DESYREL) 150 MG tablet Take 1 tablet (150 mg total) by mouth at bedtime. 07/06/22  Yes Myrlene Broker, MD  vitamin E 180 MG (400 UNITS) capsule Take 400 Units by mouth daily.   Yes [provider]  albuterol (PROVENTIL) (2.5 MG/3ML) 0.083% nebulizer solution Take 3 mLs (2.5 mg total) by nebulization every 6 (six) hours as needed for wheezing or shortness of breath. 11/25/21   Icard, Rachel Bo, DO  benzonatate (TESSALON) 200 MG capsule Take 1 capsule (200 mg total) by mouth 3 (three) times daily as needed for cough. 03/17/22   Waymon Budge, MD  Continuous Blood Gluc Sensor (FREESTYLE LIBRE 14 DAY SENSOR) MISC 1 each by Does not apply route every 14 (fourteen) days. Change every 2 weeks Patient taking differently: Inject 1 Device into the skin every 14 (fourteen) days. 06/24/19   Carlus Pavlov, MD  diazepam (VALIUM) 5 MG tablet Take 1 tablet (5 mg total) by mouth once as needed (MRI claustrophobia). 04/27/22   Henreitta Leber, MD  ipratropium (ATROVENT) 0.03 % nasal spray Place 2 sprays into both nostrils every 12 (twelve) hours. 01/16/22   Glenford Bayley, NP  methocarbamol (ROBAXIN-750) 750 MG tablet Take 1 tablet (750 mg total) by mouth every 8 (eight) hours as needed for muscle spasms. 08/14/22   Kerri Perches, MD  oxyCODONE-acetaminophen (PERCOCET) 10-325 MG tablet Take 1 tablet by mouth every 8 (eight) hours as needed for pain. 08/18/22   Beverely Low, MD  potassium chloride (KLOR-CON) 10 MEQ tablet Take 1 tablet (10 mEq total) by mouth 2 (two) times daily. 02/01/22   Kerri Perches, MD  spironolactone (ALDACTONE) 50 MG tablet TAKE 1 TABLET (50 MG TOTAL) BY  MOUTH DAILY. DISCONTINUE SPIRONOLACTONE 25MG  Patient taking differently: Take 50 mg by mouth daily. 04/18/22   Kerri Perches, MD  telmisartan (MICARDIS) 20 MG tablet Take 10 mg by mouth daily. 11/23/21   [provider]  TOUJEO MAX SOLOSTAR 300 UNIT/ML Solostar Pen INJECT 20-26 UNITS INTO THE SKIN DAILY. Patient taking differently: Inject 20-26 Units into the skin at bedtime. 04/24/22   Carlus Pavlov, MD  umeclidinium-vilanterol [facility] ELLIPTA) 62.5-25 MCG/ACT AEPB Inhale 1 puff into the lungs daily. 06/20/22   Waymon Budge, MD  UNABLE TO FIND Med Name:  G5 Mattress 07/31/22   Kerri Perches, MD    Physical Exam: BP (!) 167/67 (BP Location: Right Arm)   Pulse 83   Temp 98.6 F (37 C)   Resp 18   Ht 4\' 11"  (  1.499 m)   Wt 78.8 kg   SpO2 100%   BMI 35.09 kg/m   General: Sleepy but arousable, oriented to self and place Eyes: EOMI, clear conjuctivae, white sclerea Neck: supple, no masses, trachea mildline  Cardiovascular: RRR, no murmurs or rubs, no peripheral edema  Respiratory: clear to auscultation bilaterally, no wheezes, no crackles  Abdomen: soft, nontender, nondistended, normal bowel tones heard  Skin: dry, no rashes  Musculoskeletal: no joint effusions, normal range of motion for left shoulder, which is in a sling Psychiatric: appropriate affect, normal speech  Neurologic: extraocular muscles intact, slightly slurred speech which is baseline for her, she globally she is quite weak, but moving all her extremities and following commands         Recent Labs and Imaging Reviewed:  Basic Metabolic Panel: Recent Labs  Lab 08/18/22 1435 08/19/22 0316  NA 138 134*  K 4.0 4.6  CL 106 97*  CO2 22 25  GLUCOSE 233* 189*  BUN 14 16  CREATININE 0.80 0.96  CALCIUM 8.6* 8.4*    Liver Function Tests: No results for input(s): "AST", "ALT", "ALKPHOS", "BILITOT", "PROT", "ALBUMIN" in the last 168 hours. No results for input(s): "LIPASE", "AMYLASE" in the last  168 hours. No results for input(s): "AMMONIA" in the last 168 hours. CBC: Recent Labs  Lab 08/18/22 1435 08/19/22 0316  WBC 7.9  --   NEUTROABS 5.8  --   HGB 10.9* 10.9*  HCT 38.7 38.0  MCV 81.6  --   PLT 230  --     Cardiac Enzymes: No results for input(s): "CKTOTAL", "CKMB", "CKMBINDEX", "TROPONINI" in the last 168 hours.  BNP (last 3 results) No results for input(s): "BNP" in the last 8760 hours.  ProBNP (last 3 results) No results for input(s): "PROBNP" in the last 8760 hours.  CBG: Recent Labs  Lab 08/21/22 1153 08/21/22 1641 08/21/22 2130 08/22/22 0728 08/22/22 1125  GLUCAP 130* 136* 122* 122* 87    Radiological Exams on Admission: No results found.  Summary and Recommendations: This is a pleasant 72 year old female with a history of hypertension, hyperlipidemia, insulin-dependent type 2 diabetes, Bipolar disorder, hypersomnolence and OSA who underwent elective left sided reverse total shoulder arthroplasty, and is seen in consultation at the request of Dr. Ranell Patrick due to concerns for daytime lethargy, somnolence and agitation at night.  Most likely this is all delirium multiple risk factors (hospital admission, sleep disturbance, narcotic medication, anesthesia) in the setting of her known bipolar disorder and history of hypersomnia.  Hospital delirium-unfortunately she has multiple risk factors for this, given her hypersomnolence from OSA, bipolar disorder with hallucinations, and now with hospital admission, recent surgery, etc. -Would minimize delirium risk factors, and polypharmacy as able -Discontinue trazodone, avoid benzos -CPAP for sleep apnea -Keep patient oriented and awake during the daytime, with blinds open, and lights on -Minimize interruptions to her sleep cycle is much as possible while in the hospital -Pharmacy consultation requested to review for drug interactions and verify appropriate dosing  AKI-renal function does appear to be elevated from  her normal baseline, this may be prerenal azotemia due to reduced oral intake in the last day or so, due to somnolence -Will increase normal saline to 100 cc/h -Would like to avoid holding her antihypertensives if possible, but if creatinine continues to rise in the morning would need to hold -Recheck renal function in the morning  Blood pressure-continue current regimen, appears to be under appropriate control  DM2-carb controlled diet, with basal bolus insulin  Thank you for involving [name] in the care of your patient.  [facility] will continue to follow along with you.    Code Status: Full Code  Time spent: 65 minutes  Martin Smeal Sharlette Dense MD [facility] Pager [phone]  If 7PM-7AM, please contact night-coverage [email or web address] Password [email or web address]  08/22/2022, 1:50 PM

## 2022-08-22 NOTE — Progress Notes (Signed)
Patient very agitated and restless attempting to get out of bed. Throwing legs over rails attempt to get oob unassisted. She has pulled off dressing, steristrips to left shoulder surgical area numerous times, attempted redirection without success, very confused. Notified Dr. Shon Baton on call for ortho received verbal order to place a safety sitter.

## 2022-08-23 ENCOUNTER — Ambulatory Visit (HOSPITAL_COMMUNITY): Payer: Medicare HMO | Admitting: Psychiatry

## 2022-08-23 DIAGNOSIS — Z96612 Presence of left artificial shoulder joint: Secondary | ICD-10-CM | POA: Diagnosis not present

## 2022-08-23 LAB — BASIC METABOLIC PANEL
Anion gap: 11 (ref 5–15)
BUN: 44 mg/dL — ABNORMAL HIGH (ref 8–23)
CO2: 20 mmol/L — ABNORMAL LOW (ref 22–32)
Calcium: 8.6 mg/dL — ABNORMAL LOW (ref 8.9–10.3)
Chloride: 105 mmol/L (ref 98–111)
Creatinine, Ser: 1.48 mg/dL — ABNORMAL HIGH (ref 0.44–1.00)
GFR, Estimated: 37 mL/min — ABNORMAL LOW (ref >60)
Glucose, Bld: 89 mg/dL (ref 70–99)
Potassium: 4.5 mmol/L (ref 3.5–5.1)
Sodium: 136 mmol/L (ref 135–145)

## 2022-08-23 LAB — GLUCOSE, CAPILLARY
Glucose-Capillary: 145 mg/dL — ABNORMAL HIGH (ref 70–99)
Glucose-Capillary: 91 mg/dL (ref 70–99)
Glucose-Capillary: 95 mg/dL (ref 70–99)
Glucose-Capillary: 95 mg/dL (ref 70–99)

## 2022-08-23 MED ORDER — SODIUM CHLORIDE 0.9 % IV BOLUS
1000.0000 mL | Freq: Once | INTRAVENOUS | Status: AC
  Administered 2022-08-23: 1000 mL via INTRAVENOUS

## 2022-08-23 MED ORDER — OXYCODONE HCL 5 MG PO TABS: 5.0000 mg | ORAL_TABLET | ORAL | 0 refills | Status: DC | PRN

## 2022-08-23 MED ORDER — MELATONIN 3 MG PO TABS
3.0000 mg | ORAL_TABLET | Freq: Every day | ORAL | Status: DC
  Administered 2022-08-23 – 2022-08-24 (×2): 3 mg via ORAL
  Filled 2022-08-23 (×2): qty 1

## 2022-08-23 NOTE — TOC Progression Note (Signed)
Transition of Care [facility]) - Progression Note    Patient Details  Name: [name] MRN: [id number] Date of Birth: 06/02/1950  [facility] CM/SW Contact  Amada Jupiter, LCSW Phone Number: 08/23/2022, 2:04 PM  Clinical Narrative:     Met with pt and son in room today and niece, Jonathon Jordan, on speaker phone.  Attempted to discuss SNF plan further with all, however,  pt is not in agreement with this plan and insists she will be safe to dc home with PCS aide.  Son notes that she does not have 24/7 support at home and he is in agreement with therapy recommendation for SNF.  Son and niece are continuing to discuss with pt and they understand that we cannot force her to go, however, do not currently have any SNF bed offers.  Have widened bed search area today and have requested that family continue to discuss this with pt.  TOC continues to follow.  Expected Discharge Plan: Home/Self Care Barriers to Discharge: Continued Medical Work up  Expected Discharge Plan and Services In-house Referral: NA Discharge Planning Services: NA Post Acute Care Choice: NA Living arrangements for the past 2 months: Apartment Expected Discharge Date: 08/23/22               DME Arranged: N/A DME Agency: NA                   Social Determinants of Health (SDOH) Interventions SDOH Screenings   Food Insecurity: No Food Insecurity (08/20/2022)  Housing: Patient Declined (08/20/2022)  Transportation Needs: No Transportation Needs (08/20/2022)  Utilities: Not At Risk (08/20/2022)  Alcohol Screen: Low Risk  (07/17/2022)  Depression (PHQ2-9): Low Risk  (08/03/2022)  Recent Concern: Depression (PHQ2-9) - High Risk (05/25/2022)  Financial Resource Strain: Low Risk  (07/17/2022)  Physical Activity: Insufficiently Active (07/17/2022)  Social Connections: Moderately Integrated (07/17/2022)  Stress: No Stress Concern Present (07/17/2022)  Tobacco Use: Medium Risk (08/18/2022)    Readmission Risk Interventions     08/21/2022   11:54 AM 09/16/2021   10:07 AM  Readmission Risk Prevention Plan  Transportation Screening Complete Complete  Medication Review Oceanographer) Complete Complete  PCP or Specialist appointment within 3-5 days of discharge Complete   HRI or Home Care Consult Complete Complete  SW Recovery Care/Counseling Consult Complete Complete  Palliative Care Screening Not Applicable Not Applicable  Skilled Nursing Facility Not Applicable Patient Refused

## 2022-08-23 NOTE — Progress Notes (Signed)
Asked PT about cpap, she states she is unsure if she wants to use it tonight. I instructed her to call if she decided she wanted it.

## 2022-08-23 NOTE — Progress Notes (Signed)
[facility] CONSULT PROGRESS NOTE    Progress Note  [name]  BJY:[id number] DOB: March 17, 1950 DOA: 08/18/2022 PCP: Kerri Perches, MD     Brief Narrative:   [name] is an 72 y.o. female past medical history significant for essential hypertension bipolar disorder insulin-dependent diabetes mellitus type 2 was admitted to the hospital for an elective left shoulder replacement on 08/18/2022 with Dr. Debby Bud requested a consultation for agitation somnolence and concern for polypharmacy.  Initially seen by hospitalist due to uncontrolled hypertension which improved after resuming her home meds.   Assessment/Plan:   H/O total shoulder replacement, left Per orthopedic surgery.  Acute confusional state: She has multiple risk factors given her somnolence somnolence. Trazodone and benzos were discontinued. CPAP at night. Start melatonin. Now resolved, will sign off.  Acute kidney injury: Prerenal azotemia was started on IV fluids. Basic metabolic panels pending today.  Elevated blood pressure/hypertension: Now improved once restarting her home meds.  Diabetes mellitus type 2: Well-controlled on long-acting insulin plus sliding scale continue current regimen.     DVT prophylaxis: lovenox Family Communication:none Status is: Inpatient Remains inpatient appropriate because: Acute confusional state now resolved    Code Status:     Code Status Orders  (From admission, onward)           Start     Ordered   08/18/22 1604  Full code  Continuous       Question:  By:  Answer:  Consent: discussion documented in EHR   08/18/22 1603           Code Status History     Date Active Date Inactive Code Status Order ID Comments User Context   10/03/2021 1948 10/05/2021 2123 Full Code [id number]  Zierle-Ghosh, Asia B, DO ED   09/23/2021 0613 09/24/2021 1930 Full Code [id number]  Frankey Shown, DO Inpatient   09/16/2021 0602 09/17/2021 2001 Full Code [id number]   Zierle-Ghosh, Asia B, DO Inpatient   12/04/2018 1708 12/08/2018 1653 Full Code [id number]  Shon Hale, MD Inpatient   10/28/2017 1147 10/30/2017 1548 Full Code [id number]  Catarina Hartshorn, MD Inpatient   10/15/2016 2010 10/20/2016 1906 Full Code [id number]  Haydee Monica, MD Inpatient   09/10/2016 1213 09/18/2016 1546 Full Code [id number]  Erick Blinks, MD Inpatient   02/21/2015 1713 02/22/2015 2028 Full Code [id number]  Elliot Cousin, MD ED   06/02/2014 0411 06/07/2014 1715 Full Code [id number]  Floydene Flock, MD ED   02/15/2012 1352 02/17/2012 1348 Full Code [id number]  Wilson Singer, MD Inpatient   11/23/2011 1625 12/01/2011 1929 Full Code [id number]  Floreen Comber, RN Inpatient   08/02/2011 0520 08/03/2011 2015 Full Code [id number]  Etheleen Mayhew June, RN Inpatient   04/27/2011 1640 04/28/2011 1815 Full Code [id number]  Toler, Pearla Dubonnet, RN Inpatient      Advance Directive Documentation    Flowsheet Row Most Recent Value  Type of Advance Directive Healthcare Power of Attorney  Pre-existing out of facility DNR order (yellow form or pink MOST form) --  "MOST" Form in Place? --         IV Access:   Peripheral IV   Procedures and diagnostic studies:   No results found.   Medical Consultants:   None.   Subjective:    Jamar Rummell Sortor does not want to go to rehab  Objective:    Vitals:   08/22/22 1531 08/22/22 1918 08/22/22 2248 08/23/22 0447  BP: (!) 129/55 (!) 115/55  (!) 110/51  Pulse: 71 73 69 66  Resp: 14 16 16 18   Temp: 98.6 F (37 C) 98.7 F (37.1 C)  98.5 F (36.9 C)  TempSrc: Oral     SpO2: 95% 98% 93% 98%  Weight:      Height:       SpO2: 98 % O2 Flow Rate (L/min): 3 L/min FiO2 (%): 21 %   Intake/Output Summary (Last 24 hours) at 08/23/2022 1139 Last data filed at 08/23/2022 0800 Gross per 24 hour  Intake 969.71 ml  Output 700 ml  Net 269.71 ml   Filed Weights   08/18/22 0819 08/18/22 1600  Weight: 75.8 kg 78.8 kg    Exam: General exam: In  no acute distress. Respiratory system: Good air movement and clear to auscultation. Cardiovascular system: S1 & S2 heard, RRR. No JVD. Gastrointestinal system: Abdomen is nondistended, soft and nontender.  Extremities: No pedal edema. Skin: No rashes, lesions or ulcers Psychiatry: Judgement and insight appear normal. Mood & affect appropriate.    Data Reviewed:    Labs: Basic Metabolic Panel: Recent Labs  Lab 08/18/22 1435 08/19/22 0316 08/22/22 1319  NA 138 134* 134*  K 4.0 4.6 4.6  CL 106 97* 102  CO2 22 25 25   GLUCOSE 233* 189* 100*  BUN 14 16 34*  CREATININE 0.80 0.96 1.25*  CALCIUM 8.6* 8.4* 8.9   GFR Estimated Creatinine Clearance: 36.9 mL/min (A) (by C-G formula based on SCr of 1.25 mg/dL (H)). Liver Function Tests: Recent Labs  Lab 08/22/22 1319  AST 25  ALT 24  ALKPHOS 62  BILITOT 0.7  PROT 7.2  ALBUMIN 2.9*   No results for input(s): "LIPASE", "AMYLASE" in the last 168 hours. No results for input(s): "AMMONIA" in the last 168 hours. Coagulation profile No results for input(s): "INR", "PROTIME" in the last 168 hours. COVID-19 Labs  No results for input(s): "DDIMER", "FERRITIN", "LDH", "CRP" in the last 72 hours.  Lab Results  Component Value Date   SARSCOV2NAA NEGATIVE 11/20/2021   SARSCOV2NAA NEGATIVE 11/09/2021   SARSCOV2NAA NEGATIVE 10/05/2021   SARSCOV2NAA Detected (A) 04/25/2021    CBC: Recent Labs  Lab 08/18/22 1435 08/19/22 0316 08/22/22 1319  WBC 7.9  --  10.2  NEUTROABS 5.8  --  7.1  HGB 10.9* 10.9* 10.8*  HCT 38.7 38.0 36.4  MCV 81.6  --  77.1*  PLT 230  --  290   Cardiac Enzymes: No results for input(s): "CKTOTAL", "CKMB", "CKMBINDEX", "TROPONINI" in the last 168 hours. BNP (last 3 results) No results for input(s): "PROBNP" in the last 8760 hours. CBG: Recent Labs  Lab 08/22/22 0728 08/22/22 1125 08/22/22 1625 08/22/22 2121 08/23/22 0754  GLUCAP 122* 87 121* 136* 145*   D-Dimer: No results for input(s): "DDIMER"  in the last 72 hours. Hgb A1c: No results for input(s): "HGBA1C" in the last 72 hours. Lipid Profile: No results for input(s): "CHOL", "HDL", "LDLCALC", "TRIG", "CHOLHDL", "LDLDIRECT" in the last 72 hours. Thyroid function studies: No results for input(s): "TSH", "T4TOTAL", "T3FREE", "THYROIDAB" in the last 72 hours.  Invalid input(s): "FREET3" Anemia work up: No results for input(s): "VITAMINB12", "FOLATE", "FERRITIN", "TIBC", "IRON", "RETICCTPCT" in the last 72 hours. Sepsis Labs: Recent Labs  Lab 08/18/22 1435 08/22/22 1319  WBC 7.9 10.2   Microbiology Recent Results (from the past 240 hour(s))  MRSA Next Gen by PCR, Nasal     Status: None   Collection Time: 08/18/22  3:58 PM   Specimen: Nasal Mucosa; Nasal Swab  Result Value  Ref Range Status   MRSA by PCR Next Gen NOT DETECTED NOT DETECTED Final    Comment: (NOTE) The GeneXpert MRSA Assay (FDA approved for NASAL specimens only), is one component of a comprehensive MRSA colonization surveillance program. It is not intended to diagnose MRSA infection nor to guide or monitor treatment for MRSA infections. Test performance is not FDA approved in patients less than 44 years old. Performed at [facility], [address], [place], [place] [address]      Medications:    acidophilus  2 capsule Oral TID   amLODipine  10 mg Oral Daily   ascorbic acid  500 mg Oral Daily   aspirin EC  81 mg Oral Q breakfast   buPROPion  150 mg Oral Daily   calcium-vitamin D  1 tablet Oral Q breakfast   cholecalciferol  1,000 Units Oral Daily   cycloSPORINE  1 drop Both Eyes BID   docusate sodium  100 mg Oral BID   doxycycline  100 mg Oral BID   ezetimibe  10 mg Oral Daily   insulin aspart  0-15 Units Subcutaneous TID WC   insulin aspart  0-5 Units Subcutaneous QHS   insulin aspart  4 Units Subcutaneous TID WC   insulin glargine-yfgn  20 Units Subcutaneous QHS   irbesartan  75 mg Oral Daily   lamoTRIgine  100 mg Oral  BID   levothyroxine  25 mcg Oral Once per day on Sun   levothyroxine  50 mcg Oral Once per day on Mon Tue Wed Thu Fri Sat   metoprolol tartrate  50 mg Oral BID   mirabegron ER  25 mg Oral Daily   montelukast  10 mg Oral Daily   multivitamin with minerals  1 tablet Oral Daily   omega-3 acid ethyl esters  1 g Oral Daily   pantoprazole  40 mg Oral Daily   potassium chloride  10 mEq Oral BID   risperiDONE  0.5 mg Oral QHS   rosuvastatin  5 mg Oral QHS   sertraline  100 mg Oral Daily   spironolactone  50 mg Oral Daily   vitamin E  400 Units Oral Daily   Continuous Infusions:  sodium chloride 50 mL/hr at 08/22/22 1708      LOS: 5 days   Marinda Elk  [facility]  08/23/2022, 11:39 AM

## 2022-08-23 NOTE — Progress Notes (Addendum)
Occupational Therapy Treatment Patient Details Name: [name] MRN: [id number] DOB: 15-Jan-1951 Today's Date: 08/23/2022   History of present illness Patient is a 72 year old female who presented on 6/7 for elective left shoulder arthroplasty. Post op patient was having difficulty with waking up thus transitioned to SDU. Patient also noted to have HTN. PMH: anxiety, brain tumor, cancer, glaucoma, major depression, narcolepsy, seizures, stroke with L side weakness, back surgery, craniotomy, carpal tunnel release L side,   OT comments  The pt reported having LUE pain, as well as discomfort at sling strap placement around her neck; OT assisted with readjustment of sling. The pt was seen for ADL instruction, with the pt requiring assist for toileting at bathroom level, as well as for steadying in standing while performing grooming at the sink. She required education and reinforcement to recall shoulder precautions, including NWB status, sling wear schedule, and LUE elbow, wrist, and hand ROM. She will continue to benefit from OT services to maximize her safety and independence with self-care tasks. Patient will benefit from continued inpatient follow up therapy, <3 hours/day.   Recommendations for follow up therapy are one component of a multi-disciplinary discharge planning process, led by the attending physician.  Recommendations may be updated based on patient status, additional functional criteria and insurance authorization.    Assistance Recommended at Discharge Frequent or constant Supervision/Assistance  Patient can return home with the following  A lot of help with bathing/dressing/bathroom;A lot of help with walking and/or transfers;Assistance with cooking/housework;Direct supervision/assist for medications management;Assist for transportation;Help with stairs or ramp for entrance   Equipment Recommendations  None recommended by OT       Precautions / Restrictions  Precautions Precautions: Shoulder;Fall Type of Shoulder Precautions: no ROM shoulder, ok for hand wrist and elbow AROM, Shoulder Interventions: Shoulder sling/immobilizer;At all times;Off for dressing/bathing/exercises Precaution Booklet Issued: Yes (comment) Restrictions Weight Bearing Restrictions: Yes LUE Weight Bearing: Non weight bearing       Mobility Bed Mobility Overal bed mobility: Needs Assistance Bed Mobility: Sit to Supine       Sit to supine: Min guard        Transfers Overall transfer level: Needs assistance Equipment used: 1 person hand held assist Transfers: Sit to/from Stand Sit to Stand: Min assist                     ADL either performed or assessed with clinical judgement   ADL Overall ADL's : Needs assistance/impaired Eating/Feeding: Set up;Sitting   Grooming: Min guard;Standing Grooming Details (indicate cue type and reason): She required steadying assist to wash her R hand at sink level.         Upper Body Dressing : Moderate assistance Upper Body Dressing Details (indicate cue type and reason): She required increased assist for sling management and adjustment. Lower Body Dressing: Maximal assistance;Sit to/from stand   Toilet Transfer: Ambulation;Regular Toilet;Grab bars Toilet Transfer Details (indicate cue type and reason): The pt was assisted to the bathroom, ambulating without an assistive device, however needing HHA on the right. She required assist for balance, as well as verbal cues for best body positioning during transfer and use of grab bar as needed. Toileting- Clothing Manipulation and Hygiene: Moderate assistance;Sit to/from stand Toileting - Clothing Manipulation Details (indicate cue type and reason): She stood at bathroom level, requiring assist for balance. She required assist for clothing management. She used her RUE to perform anterior hygiene after urinating.  Cognition  Arousal/Alertness: Awake/alert Behavior During Therapy: Flat affect Overall Cognitive Status: Within Functional Limits for tasks assessed                        Pertinent Vitals/ Pain       Pain Assessment Pain Assessment: Faces Pain Score: 4  Pain Location: L shoulder Pain Descriptors / Indicators: Grimacing, Guarding Pain Intervention(s): Limited activity within patient's tolerance, Monitored during session, Repositioned         Frequency  Min 1X/week        Progress Toward Goals  OT Goals(current goals can now be found in the care plan section)  Progress towards OT goals: Progressing toward goals  Acute Rehab OT Goals Patient Stated Goal: to get better and return home OT Goal Formulation: With patient Time For Goal Achievement: 09/02/22 Potential to Achieve Goals: Good  Plan Discharge plan remains appropriate       AM-PAC OT "6 Clicks" Daily Activity     Outcome Measure   Help from another person eating meals?: A Little Help from another person taking care of personal grooming?: A Little Help from another person toileting, which includes using toliet, bedpan, or urinal?: A Lot Help from another person bathing (including washing, rinsing, drying)?: A Lot Help from another person to put on and taking off regular upper body clothing?: A Lot Help from another person to put on and taking off regular lower body clothing?: A Lot 6 Click Score: 14    End of Session Equipment Utilized During Treatment: Gait belt  OT Visit Diagnosis: Unsteadiness on feet (R26.81);Pain Pain - Right/Left: Left Pain - part of body: Shoulder   Activity Tolerance Patient limited by pain   Patient Left in bed;with call bell/phone within reach;with bed alarm set   Nurse Communication Mobility status        Time: 4098-1191 OT Time Calculation (min): 16 min  Charges: OT General Charges $OT Visit: 1 Visit OT Treatments $Self Care/Home Management : 8-22 mins     Reuben Likes, OTR/L 08/23/2022, 11:53 AM

## 2022-08-23 NOTE — Progress Notes (Signed)
Orthopedics Progress Note  Subjective: Patient still  reports difficulty with the left arm with limited function. Pain is controlled on po meds  Objective:  Vitals:   08/22/22 2248 08/23/22 0447  BP:  (!) 110/51  Pulse: 69 66  Resp: 16 18  Temp:  98.5 F (36.9 C)  SpO2: 93% 98%    General: Awake and alert  Musculoskeletal: Left shoulder dressing cdi. Minimal swelling and no erythema. No pain with PROM of the shoulder.   Neurovascularly intact  Lab Results  Component Value Date   WBC 10.2 08/22/2022   HGB 10.8 (L) 08/22/2022   HCT 36.4 08/22/2022   MCV 77.1 (L) 08/22/2022   PLT 290 08/22/2022       Component Value Date/Time   NA 134 (L) 08/22/2022 1319   NA 144 06/29/2022 1150   K 4.6 08/22/2022 1319   CL 102 08/22/2022 1319   CO2 25 08/22/2022 1319   GLUCOSE 100 (H) 08/22/2022 1319   BUN 34 (H) 08/22/2022 1319   BUN 28 (H) 06/29/2022 1150   CREATININE 1.25 (H) 08/22/2022 1319   CREATININE 1.24 (H) 09/30/2018 1143   CALCIUM 8.9 08/22/2022 1319   GFRNONAA 46 (L) 08/22/2022 1319   GFRNONAA 45 (L) 09/30/2018 1143   GFRAA 72 12/11/2019 1224   GFRAA 52 (L) 09/30/2018 1143    Lab Results  Component Value Date   INR 1.01 10/15/2017   INR 1.07 10/15/2016   INR 1.14 02/21/2015    Assessment/Plan: POD #5 s/p Procedure(s): REVERSE SHOULDER ARTHROPLASTY Stable this AM. Labs look good. Urine clear. Overall she is more awake and alert to me and the RN Therapy today and discharge planning. She will need to be fairly independent with ADLs with minimal assist to go home.  If PT and OT recommend SNF for safety reasons then we will have to proceed with placement.  FL-2 signed and on chart,  D/C pain med Rx signed and on chart.  Appreciate internal medicine and pharmacy support.  Hopefully we can get her either home or to rehab today.  Almedia Balls. Ranell Patrick, MD 08/23/2022 7:49 AM

## 2022-08-23 NOTE — Progress Notes (Addendum)
Physical Therapy Treatment Patient Details Name: [name] MRN: [id number] DOB: 1950/12/07 Today's Date: 08/23/2022   History of Present Illness Patient is a 72 year old female who presented on 6/7 for elective left shoulder arthroplasty. Post op patient was having difficulty with waking up thus transitioned to SDU. Patient also noted to have HTN. PMH: anxiety, brain tumor, cancer, glaucoma, major depression, narcolepsy, seizures, stroke with L side weakness, back surgery, craniotomy, carpal tunnel release L side,    PT Comments    Pt performs STS transfers from EOB, recliner and toilet with HHA, needing verbal cues for hand placement, BOS, weight shift anterior, min A to min G to steady. Pt able to respect shoulder restrictions this date, reporting "I can't put weight on it" when asked about precautions. Pt amb 3 short distances with seated rest break between, HHA and min A. Pt with 1 significant LOB posterior needing assist to prevent fall, +2 for safety with recliner follow when attempting amb outside of room. Pt conversational this date, appears to be aware of her balance deficits and verbalizes fear of falling. Pt uses rollator at baseline, currently amb short distances with HHA and demo balance deficits, pt is a high fall risk and recommend post acute PT prior to return home with caregiver and family support.   Recommendations for follow up therapy are one component of a multi-disciplinary discharge planning process, led by the attending physician.  Recommendations may be updated based on patient status, additional functional criteria and insurance authorization.  Follow Up Recommendations       Assistance Recommended at Discharge Frequent or constant Supervision/Assistance  Patient can return home with the following A little help with walking and/or transfers;A little help with bathing/dressing/bathroom;Assistance with cooking/housework;Assist for transportation   Equipment  Recommendations  None recommended by PT    Recommendations for Other Services       Precautions / Restrictions Precautions Precautions: Shoulder;Fall Type of Shoulder Precautions: no ROM shoulder, ok for hand wrist and elbow AROM, Shoulder Interventions: Shoulder sling/immobilizer;At all times;Off for dressing/bathing/exercises Precaution Booklet Issued: Yes (comment) Restrictions Weight Bearing Restrictions: Yes LUE Weight Bearing: Non weight bearing     Mobility  Bed Mobility Overal bed mobility: Needs Assistance Bed Mobility: Supine to Sit     Supine to sit: Min assist     General bed mobility comments: min A to upright trunk and scoot out to EOB, increasead time and effort to scoot out to EOB    Transfers Overall transfer level: Needs assistance Equipment used: 1 person hand held assist Transfers: Sit to/from Stand Sit to Stand: Min assist           General transfer comment: min A to power to stand and steadying assistance with HHA from EOB and toilet, able to respect NWB LUE without verbal cues    Ambulation/Gait Ambulation/Gait assistance: Min assist, +2 safety/equipment Gait Distance (Feet): 12 Feet (+12 ft, 9 ft) Assistive device: 1 person hand held assist Gait Pattern/deviations: Step-to pattern, Decreased stride length, Shuffle Gait velocity: decreased     General Gait Details: slow, short shuffling steps with varying step length, decreased heel-toe pattern and single HHA, seated rest break between each amb bout, pt with significant LOB posterior needing assist to prevent fall   Stairs             Wheelchair Mobility    Modified Rankin (Stroke Patients Only)       Balance Overall balance assessment: Needs assistance Sitting-balance support: Feet supported, Single  extremity supported Sitting balance-Leahy Scale: Fair     Standing balance support: Reliant on assistive device for balance, During functional activity, Single extremity  supported Standing balance-Leahy Scale: Poor                              Cognition Arousal/Alertness: Awake/alert Behavior During Therapy: Flat affect Overall Cognitive Status: Within Functional Limits for tasks assessed                                 General Comments: pt conversational this date, reports being in hospital, aware of day of the week and year and current president, reporting fear of falling and aware of functional deficits        Exercises      General Comments        Pertinent Vitals/Pain Pain Assessment Pain Assessment: Faces Faces Pain Scale: Hurts little more Pain Location: L shoulder Pain Descriptors / Indicators: Grimacing, Guarding Pain Intervention(s): Limited activity within patient's tolerance, Monitored during session, Repositioned    Home Living                          Prior Function            PT Goals (current goals can now be found in the care plan section) Acute Rehab PT Goals Patient Stated Goal: go to the bathroom PT Goal Formulation: With patient Time For Goal Achievement: 09/04/22 Potential to Achieve Goals: Good Progress towards PT goals: Progressing toward goals    Frequency    Min 1X/week      PT Plan Current plan remains appropriate    Co-evaluation              AM-PAC PT "6 Clicks" Mobility   Outcome Measure  Help needed turning from your back to your side while in a flat bed without using bedrails?: A Little Help needed moving from lying on your back to sitting on the side of a flat bed without using bedrails?: A Little Help needed moving to and from a bed to a chair (including a wheelchair)?: A Little Help needed standing up from a chair using your arms (e.g., wheelchair or bedside chair)?: A Little Help needed to walk in hospital room?: A Lot Help needed climbing 3-5 steps with a railing? : Total 6 Click Score: 15    End of Session Equipment Utilized During  Treatment: Gait belt Activity Tolerance: Patient tolerated treatment well Patient left: in chair;with call bell/phone within reach;with chair alarm set Nurse Communication: Mobility status PT Visit Diagnosis: Muscle weakness (generalized) (M62.81);Other abnormalities of gait and mobility (R26.89);Pain Pain - Right/Left: Left Pain - part of body: Shoulder     Time: 0932-1000 PT Time Calculation (min) (ACUTE ONLY): 28 min  Charges:  $Gait Training: 8-22 mins $Therapeutic Activity: 8-22 mins                     Tori Bradi [place] PT, DPT 08/23/22, 11:23 AM

## 2022-08-24 ENCOUNTER — Encounter (HOSPITAL_COMMUNITY): Payer: Self-pay | Admitting: Orthopedic Surgery

## 2022-08-24 LAB — BASIC METABOLIC PANEL
Anion gap: 8 (ref 5–15)
BUN: 38 mg/dL — ABNORMAL HIGH (ref 8–23)
CO2: 21 mmol/L — ABNORMAL LOW (ref 22–32)
Calcium: 8.6 mg/dL — ABNORMAL LOW (ref 8.9–10.3)
Chloride: 107 mmol/L (ref 98–111)
Creatinine, Ser: 1.05 mg/dL — ABNORMAL HIGH (ref 0.44–1.00)
GFR, Estimated: 56 mL/min — ABNORMAL LOW (ref >60)
Glucose, Bld: 92 mg/dL (ref 70–99)
Potassium: 5.1 mmol/L (ref 3.5–5.1)
Sodium: 136 mmol/L (ref 135–145)

## 2022-08-24 LAB — GLUCOSE, CAPILLARY
Glucose-Capillary: 88 mg/dL (ref 70–99)
Glucose-Capillary: 69 mg/dL — ABNORMAL LOW (ref 70–99)
Glucose-Capillary: 77 mg/dL (ref 70–99)
Glucose-Capillary: 123 mg/dL — ABNORMAL HIGH (ref 70–99)
Glucose-Capillary: 103 mg/dL — ABNORMAL HIGH (ref 70–99)
Glucose-Capillary: 105 mg/dL — ABNORMAL HIGH (ref 70–99)

## 2022-08-24 MED ORDER — SODIUM ZIRCONIUM CYCLOSILICATE 10 G PO PACK
10.0000 g | PACK | Freq: Once | ORAL | Status: AC
  Administered 2022-08-24: 10 g via ORAL
  Filled 2022-08-24: qty 1

## 2022-08-24 NOTE — Discharge Summary (Signed)
In most cases prophylactic antibiotics for Dental procdeures after total joint surgery are not necessary.  Exceptions are as follows:  1. History of prior total joint infection  2. Severely immunocompromised (Organ Transplant, cancer chemotherapy, Rheumatoid biologic meds such as Humera)  3. Poorly controlled diabetes (A1C &gt; 8.0, blood glucose over 200)  If you have one of these conditions, contact your surgeon for an antibiotic prescription, prior to your dental procedure. Orthopedic Discharge Summary        Physician Discharge Summary  Patient ID: [name] MRN: [id number] DOB/AGE: 1951-01-31 72 y.o.  Admit date: 08/18/2022 Discharge date: 08/24/2022   Procedures:  Procedure(s) (LRB): REVERSE SHOULDER ARTHROPLASTY (Left)  Attending Physician:  Dr. Malon Kindle  Admission Diagnoses:   Left shoulder end stage OA  Discharge Diagnoses:  same   Past Medical History:  Diagnosis Date   Allergy    Anemia    Anemia in chronic kidney disease (CKD) 08/10/2021   Anemia in chronic kidney disease (CKD) 10/12/2021   Anxiety    takes Ativan daily   Arthritis    Assistance needed for mobility    Bipolar disorder (HCC)    takes Risperdal nightly   Blood transfusion    Brain tumor (HCC)    Cancer (HCC)    In her gum   Carpal tunnel syndrome of right wrist 05/23/2011   Cervical disc disorder with radiculopathy of cervical region 10/31/2012   Chronic back pain    Chronic idiopathic constipation    Chronic neck and back pain    Colon polyps    COPD (chronic obstructive pulmonary disease) with chronic bronchitis 09/16/2013   Office Spirometry 10/30/2013-submaximal effort based on appearance of loop and curve. Numbers would fit with severe restriction but her physiologic capability may be better than this. FVC 0.91/44%, and 10.74/45%, FEV1/FVC 0.81, FEF 25-75% 1.43/69%     Diabetes mellitus    Type II   Diverticulosis    TCS 9/08 by Dr. Lina Sar for diarrhea .  Bx for micro scopic colitis negative.    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   Metabolic encephalopathy 08/03/2011   Migraines    chronic headaches   Mononeuritis lower limb    Narcolepsy    Osteoporosis    Pancreatitis 2006   due to Depakote with normal EUS    Paralysis (HCC)    Pneumonia    Schatzki's ring    non critical / EGD with ED 8/2011with RMR   Seizures (HCC)    takes Lamictal daily.Last seizure 3 yrs ago   Sleep apnea    on CPAP   Small bowel obstruction (HCC)    Stroke (HCC)    left sided weakness, speech changes   Tubular adenoma of colon     PCP: Kerri Perches, MD   Discharged Condition: stable  Hospital Course:  Patient underwent the above stated procedure  on 08/18/2022. Patient tolerated the procedure well and brought to the recovery room in good condition and subsequently to the floor. Patient had a hospital course complicated by hypertension and delirium. These were managed by the [facility] hospitalist service and improved. She was participating in therapy and was stable for discharge.    Disposition: Discharge disposition: 01-Home or Self Care      with follow up in 2 weeks    Follow-up Information     Colvin Caroli, PA-C. Go on 08/31/2022.   Specialty: Physician Assistant Why: **You are scheduled for first post op appt on Thursday June 20 at 11:00am.** Contact information: [address] [place] [address] [phone]          Beverely Low, MD. Call in 2 week(s).   Specialty: Orthopedic Surgery Why: call [phone] for appt in two weeks Contact information: [address] [place] [address] [phone]                 Dental Antibiotics:  In most cases prophylactic antibiotics for Dental procdeures after total joint surgery are not necessary.  Exceptions are as follows:  1. History of prior total joint infection  2. Severely immunocompromised (Organ Transplant, cancer chemotherapy, Rheumatoid biologic meds such as Humera)  3. Poorly controlled diabetes (A1C &gt; 8.0, blood glucose over 200)  If you have one of these conditions, contact your surgeon for an antibiotic prescription, prior to your dental procedure.  Discharge Instructions     Call MD / Call 911   Complete by: As directed    If you experience chest pain or shortness of breath, CALL 911 and be transported to the hospital emergency room.  If you develope a fever above 101 F, pus (white drainage) or increased drainage or redness at the wound, or calf pain, call your surgeon's office.   Call MD / Call 911   Complete by: As directed    If you experience chest pain or shortness of breath, CALL 911 and be transported to the hospital emergency room.  If you develope a fever above 101 F, pus (white drainage) or increased drainage or redness at the wound, or calf pain, call your surgeon's office.   Call MD / Call 911   Complete by: As directed    If you experience chest pain or shortness of breath, CALL 911 and be transported to the hospital emergency room.  If you develope a fever above 101 F, pus (white drainage) or increased drainage or redness at the wound, or calf pain, call your surgeon's office.   Constipation Prevention   Complete by: As directed    Drink plenty of fluids.  Prune juice may be helpful.  You may use a stool softener, such as Colace (over the counter) 100 mg twice a day.  Use MiraLax (over the  counter) for constipation as needed.   Constipation Prevention   Complete by: As directed    Drink plenty of fluids.  Prune juice may be helpful.  You may use a stool softener, such as Colace (over the counter) 100 mg twice a day.  Use MiraLax (over the counter) for constipation as needed.   Constipation Prevention   Complete by: As directed    Drink plenty of fluids.  Prune juice may be helpful.  You may use a stool softener, such as Colace (over the counter) 100 mg twice a day.  Use MiraLax (over the counter) for constipation as needed.  Diet - low sodium heart healthy   Complete by: As directed    Diet - low sodium heart healthy   Complete by: As directed    Increase activity slowly as tolerated   Complete by: As directed    Increase activity slowly as tolerated   Complete by: As directed    Increase activity slowly as tolerated   Complete by: As directed    Post-operative opioid taper instructions:   Complete by: As directed    POST-OPERATIVE OPIOID TAPER INSTRUCTIONS: It is important to wean off of your opioid medication as soon as possible. If you do not need pain medication after your surgery it is ok to stop day one. Opioids include: Codeine, Hydrocodone(Norco, Vicodin), Oxycodone(Percocet, oxycontin) and hydromorphone amongst others.  Long term and even short term use of opiods can cause: Increased pain response Dependence Constipation Depression Respiratory depression And more.  Withdrawal symptoms can include Flu like symptoms Nausea, vomiting And more Techniques to manage these symptoms Hydrate well Eat regular healthy meals Stay active Use relaxation techniques(deep breathing, meditating, yoga) Do Not substitute Alcohol to help with tapering If you have been on opioids for less than two weeks and do not have pain than it is ok to stop all together.  Plan to wean off of opioids This plan should start within one week post op of your joint replacement. Maintain  the same interval or time between taking each dose and first decrease the dose.  Cut the total daily intake of opioids by one tablet each day Next start to increase the time between doses. The last dose that should be eliminated is the evening dose.      Post-operative opioid taper instructions:   Complete by: As directed    POST-OPERATIVE OPIOID TAPER INSTRUCTIONS: It is important to wean off of your opioid medication as soon as possible. If you do not need pain medication after your surgery it is ok to stop day one. Opioids include: Codeine, Hydrocodone(Norco, Vicodin), Oxycodone(Percocet, oxycontin) and hydromorphone amongst others.  Long term and even short term use of opiods can cause: Increased pain response Dependence Constipation Depression Respiratory depression And more.  Withdrawal symptoms can include Flu like symptoms Nausea, vomiting And more Techniques to manage these symptoms Hydrate well Eat regular healthy meals Stay active Use relaxation techniques(deep breathing, meditating, yoga) Do Not substitute Alcohol to help with tapering If you have been on opioids for less than two weeks and do not have pain than it is ok to stop all together.  Plan to wean off of opioids This plan should start within one week post op of your joint replacement. Maintain the same interval or time between taking each dose and first decrease the dose.  Cut the total daily intake of opioids by one tablet each day Next start to increase the time between doses. The last dose that should be eliminated is the evening dose.      Post-operative opioid taper instructions:   Complete by: As directed    POST-OPERATIVE OPIOID TAPER INSTRUCTIONS: It is important to wean off of your opioid medication as soon as possible. If you do not need pain medication after your surgery it is ok to stop day one. Opioids include: Codeine, Hydrocodone(Norco, Vicodin), Oxycodone(Percocet, oxycontin) and  hydromorphone amongst others.  Long term and even short term use of opiods can cause: Increased pain response Dependence Constipation Depression Respiratory depression And more.  Withdrawal symptoms can include Flu like symptoms Nausea, vomiting And more Techniques  to manage these symptoms Hydrate well Eat regular healthy meals Stay active Use relaxation techniques(deep breathing, meditating, yoga) Do Not substitute Alcohol to help with tapering If you have been on opioids for less than two weeks and do not have pain than it is ok to stop all together.  Plan to wean off of opioids This plan should start within one week post op of your joint replacement. Maintain the same interval or time between taking each dose and first decrease the dose.  Cut the total daily intake of opioids by one tablet each day Next start to increase the time between doses. The last dose that should be eliminated is the evening dose.          Allergies as of 08/24/2022       Reactions   Iron Itching, Nausea And Vomiting   Milk (cow) Rash, Other (See Comments)   Doesn't agree with the stomach, also   Penicillins Hives   Phenazopyridine Hives   Cephalexin Hives   Flonase [fluticasone] Other (See Comments)   "It gave me ulcers in my nose"   Milk-related Compounds Nausea Only, Other (See Comments)   Doesn't agree with the stomach        Medication List     STOP taking these medications    diazepam 5 MG tablet Commonly known as: VALIUM   hydrOXYzine 10 MG tablet Commonly known as: ATARAX   LORazepam 0.5 MG tablet Commonly known as: ATIVAN   oxyCODONE-acetaminophen 10-325 MG tablet Commonly known as: PERCOCET   pregabalin 75 MG capsule Commonly known as: LYRICA   risperiDONE 0.5 MG tablet Commonly known as: RISPERDAL   traZODone 150 MG tablet Commonly known as: DESYREL       TAKE these medications    Accu-Chek Guide Control Liqd USE AS DIRECTED   Accu-Chek Guide test  strip Generic drug: glucose blood USE TO CHECK BLOOD SUGAR FOUR TIMES A DAY AND AS NEEDED (NEED MD APPOINTMENT FOR ANY FURTHER REFILLS)   Accu-Chek Softclix Lancets lancets TEST BLOOD SUGAR THREE TIMES DAILY AS DIRECTED   acetaminophen 325 MG tablet Commonly known as: TYLENOL Take 650 mg by mouth every 6 (six) hours as needed for mild pain, moderate pain or headache.   Adjustable Arm Sling Misc L arm sling   albuterol 108 (90 Base) MCG/ACT inhaler Commonly known as: VENTOLIN HFA INHALE 1 TO 2 PUFFS EVERY 6 HOURS AS NEEDED FOR WHEEZING, SHORTNESS OF BREATH What changed: See the new instructions.   albuterol (2.5 MG/3ML) 0.083% nebulizer solution Commonly known as: PROVENTIL Take 3 mLs (2.5 mg total) by nebulization every 6 (six) hours as needed for wheezing or shortness of breath. What changed: Another medication with the same name was changed. Make sure you understand how and when to take each.   alendronate 70 MG tablet Commonly known as: FOSAMAX TAKE 1 TABLET EVERY 7 DAYS ON AN EMPTY STOMACH WITH A FULL GLASS OF WATER What changed: See the new instructions.   amLODipine 10 MG tablet Commonly known as: NORVASC Take 1 tablet (10 mg total) by mouth daily.   ascorbic acid 500 MG tablet Commonly known as: VITAMIN C Take 500 mg by mouth daily.   aspirin EC 81 MG tablet Take 1 tablet (81 mg total) by mouth daily with breakfast.   benzonatate 200 MG capsule Commonly known as: TESSALON Take 1 capsule (200 mg total) by mouth 3 (three) times daily as needed for cough.   blood glucose meter kit and supplies Dispense  based on patient and insurance preference. Use up to four times daily as directed. (FOR ICD-10 E10.9, E11.9).   buPROPion 150 MG 24 hr tablet Commonly known as: Wellbutrin XL Take 1 tablet (150 mg total) by mouth every morning.   Caltrate 600+D3 Soft 600-20 MG-MCG Chew Generic drug: Calcium Carb-Cholecalciferol Chew 1 tablet by mouth daily.   cetirizine 10 MG  tablet Commonly known as: ZYRTEC Take 1 tablet (10 mg total) by mouth daily.   clobetasol 0.05 % external solution Commonly known as: TEMOVATE Apply 1 Application topically 2 (two) times daily. What changed:  when to take this additional instructions   D3 PO Take 1 tablet by mouth daily.   Droplet Pen Needles 31G X 8 MM Misc Generic drug: Insulin Pen Needle USE FOR INJECTING INSULIN 4 TIMES DAILY.   DropSafe Alcohol Prep 70 % Pads USE TO CLEAN FINGER PRIOR TO TESTING FOR BLOOD SUGAR AS DIRECTED   DSS 100 MG Caps Take 100 mg by mouth in the morning and at bedtime.   ezetimibe 10 MG tablet Commonly known as: ZETIA TAKE 1 TABLET EVERY DAY   FISH OIL PO Take 1 capsule by mouth daily.   FreeStyle Libre 14 Day Sensor Misc 1 each by Does not apply route every 14 (fourteen) days. Change every 2 weeks What changed:  how much to take how to take this additional instructions   ipratropium 0.03 % nasal spray Commonly known as: ATROVENT Place 2 sprays into both nostrils every 12 (twelve) hours.   lactobacillus acidophilus Tabs tablet Take 2 tablets by mouth 3 (three) times daily.   lamoTRIgine 100 MG tablet Commonly known as: LAMICTAL Take 1 tablet (100 mg total) by mouth 2 (two) times daily.   levothyroxine 50 MCG tablet Commonly known as: SYNTHROID TAKE 1 TABLET DAILY SIX DAYS A WEEK AND 1/2 TABLET ON ONE DAY A WEEK What changed:  how much to take how to take this when to take this additional instructions   Mattress Pad Misc FIRM MATTRESS PAD for hospital bed x 1   methocarbamol 750 MG tablet Commonly known as: Robaxin-750 Take 1 tablet (750 mg total) by mouth every 8 (eight) hours as needed for muscle spasms.   metoprolol tartrate 50 MG tablet Commonly known as: LOPRESSOR TAKE 1 TABLET TWICE DAILY (NEED MD APPOINTMENT) What changed: See the new instructions.   montelukast 10 MG tablet Commonly known as: SINGULAIR TAKE 1 TABLET EVERY DAY   MULTIVITAMIN  GUMMIES ADULT PO Take 1 tablet by mouth daily.   Myrbetriq 25 MG Tb24 tablet Generic drug: mirabegron ER TAKE 1 TABLET EVERY DAY   nicotine 21 mg/24hr patch Commonly known as: Nicoderm CQ Place 1 patch (21 mg total) onto the skin daily.   NovoLOG FlexPen 100 UNIT/ML FlexPen Generic drug: insulin aspart Inject 5 Units into the skin 3 (three) times daily with meals. Give if eats 50% or more of meal. What changed: additional instructions   nystatin powder Commonly known as: MYCOSTATIN/NYSTOP Apply 1 Application topically 2 (two) times daily.   oxyCODONE 5 MG immediate release tablet Commonly known as: Oxy IR/ROXICODONE Take 1 tablet (5 mg total) by mouth every 4 (four) hours as needed for severe pain or breakthrough pain (pain score 4-6).   potassium chloride 10 MEQ tablet Commonly known as: KLOR-CON Take 1 tablet (10 mEq total) by mouth 2 (two) times daily.   RABEprazole 20 MG tablet Commonly known as: ACIPHEX TAKE 1 TABLET TWICE DAILY   Restasis 0.05 % ophthalmic  emulsion Generic drug: cycloSPORINE Place 2 drops into both eyes daily.   rosuvastatin 5 MG tablet Commonly known as: CRESTOR TAKE 1 TABLET AT BEDTIME   sertraline 100 MG tablet Commonly known as: ZOLOFT Take 1 tablet (100 mg total) by mouth every morning.   spironolactone 50 MG tablet Commonly known as: ALDACTONE TAKE 1 TABLET (50 MG TOTAL) BY MOUTH DAILY. DISCONTINUE SPIRONOLACTONE 25MG  What changed: See the new instructions.   telmisartan 20 MG tablet Commonly known as: MICARDIS Take 10 mg by mouth daily.   Toujeo Max SoloStar 300 UNIT/ML Solostar Pen Generic drug: insulin glargine (2 Unit Dial) INJECT 20-26 UNITS INTO THE SKIN DAILY. What changed: when to take this   umeclidinium-vilanterol 62.5-25 MCG/ACT Aepb Commonly known as: ANORO ELLIPTA Inhale 1 puff into the lungs daily.   UNABLE TO FIND Med Name:  G5 Mattress   vitamin E 180 MG (400 UNITS) capsule Generic drug: vitamin E Take 400  Units by mouth daily.       ASK your doctor about these medications    doxycycline 100 MG tablet Commonly known as: VIBRA-TABS Take 1 tablet (100 mg total) by mouth 2 (two) times daily for 10 days. Ask about: Should I take this medication?          Signed: Verlee Rossetti 08/24/2022, 1:04 PM  [facility] is now [facility] [address], [address], [place], [place] [address] Phone: [phone] Facebook  Instagram  [employer]

## 2022-08-24 NOTE — Progress Notes (Signed)
Patient confused and agitated overnight, was unable to be re-oriented to place, time, or situation. Patient continually refused aspects of care including SCDs, frequent turns, and oral care. Patient has not been attempting to get out of bed. Bed alarm is on d/t high fall risk. Paged NP to notify, NP aware. Will continue to attempt re-orientation and de-escalation with patient.

## 2022-08-24 NOTE — Progress Notes (Signed)
Orthopedics Progress Note  Subjective: Patient reporting minimal pain with the left shoulder. She is wanting to go home and refuses Rehab.   Objective:  Vitals:   08/23/22 2205 08/24/22 0440  BP: 138/61 (!) 131/59  Pulse: 75 64  Resp:  16  Temp:  98.3 F (36.8 C)  SpO2: 100% 98%    General: Awake and alert  Musculoskeletal: Left shoulder dressing CDI. Minimal swelling in the left arm and no pain with PROM and AAROM of the left shoulder.  Neurovascularly intact  Lab Results  Component Value Date   WBC 10.2 08/22/2022   HGB 10.8 (L) 08/22/2022   HCT 36.4 08/22/2022   MCV 77.1 (L) 08/22/2022   PLT 290 08/22/2022       Component Value Date/Time   NA 136 08/24/2022 0420   NA 144 06/29/2022 1150   K 5.1 08/24/2022 0420   CL 107 08/24/2022 0420   CO2 21 (L) 08/24/2022 0420   GLUCOSE 92 08/24/2022 0420   BUN 38 (H) 08/24/2022 0420   BUN 28 (H) 06/29/2022 1150   CREATININE 1.05 (H) 08/24/2022 0420   CREATININE 1.24 (H) 09/30/2018 1143   CALCIUM 8.6 (L) 08/24/2022 0420   GFRNONAA 56 (L) 08/24/2022 0420   GFRNONAA 45 (L) 09/30/2018 1143   GFRAA 72 12/11/2019 1224   GFRAA 52 (L) 09/30/2018 1143    Lab Results  Component Value Date   INR 1.01 10/15/2017   INR 1.07 10/15/2016   INR 1.14 02/21/2015    Assessment/Plan: POD #1 s/p Procedure(s): REVERSE SHOULDER ARTHROPLASTY Patient may use the left hand and arm for ADLs and balance and the walker.  PT and OT today. Medically stable - appreciate hospitalist support.  Plan is for discharge to home once cleared by therapy, possibly late today or tomorrow  Almedia Balls. Ranell Patrick, MD 08/24/2022 1:01 PM

## 2022-08-24 NOTE — Progress Notes (Signed)
Physical Therapy Treatment Patient Details Name: [name] MRN: [id number] DOB: March 14, 1950 Today's Date: 08/24/2022   History of Present Illness Patient is a 72 year old female who presented on 6/7 for elective left shoulder arthroplasty. Post op patient was having difficulty with waking up thus transitioned to SDU. Patient also noted to have HTN. PMH: anxiety, brain tumor, cancer, glaucoma, major depression, narcolepsy, seizures, stroke with L side weakness, back surgery, craniotomy, carpal tunnel release L side,    PT Comments    Pt aware she is in hospital and month is June but reports year is July and unsure why she is in the hospital. Pt needing verbal cues for safety with LUE in sling, educated on precautions with pt unaware she has any. Per ortho note, "Patient may use the left hand and arm for ADLs and balance and the walker." Significant education regarding LUE placement on AD with ambulation only, avoiding pulling and pushing with arm with transfers. Pt needing min A to maneuver rollator through doorway, around IV pole in room and around bed in room. Pt amb min G in hallway without obstacles. Pt limited in distances, requests seated rest breaks and doesn't elaborate. Continue to recommend post acute therapy (<3 hrs/day) prior to returning home with aide and family support.   Recommendations for follow up therapy are one component of a multi-disciplinary discharge planning process, led by the attending physician.  Recommendations may be updated based on patient status, additional functional criteria and insurance authorization.  Follow Up Recommendations       Assistance Recommended at Discharge Frequent or constant Supervision/Assistance  Patient can return home with the following A little help with walking and/or transfers;A little help with bathing/dressing/bathroom;Assistance with cooking/housework;Assist for transportation   Equipment Recommendations  None recommended by PT     Recommendations for Other Services       Precautions / Restrictions Precautions Precautions: Shoulder;Fall Type of Shoulder Precautions: no ROM shoulder, ok for hand wrist and elbow AROM, Shoulder Interventions: Shoulder sling/immobilizer;At all times;Off for dressing/bathing/exercises Required Braces or Orthoses: Sling Restrictions Weight Bearing Restrictions: Yes LUE Weight Bearing: Non weight bearing     Mobility  Bed Mobility Overal bed mobility: Needs Assistance Bed Mobility: Supine to Sit     Supine to sit: Min guard, HOB elevated     General bed mobility comments: min G with strong use of bedrail to upright trunk into sitting, increased time and effort with HOB elevated    Transfers Overall transfer level: Needs assistance Equipment used: Rollator (4 wheels) Transfers: Sit to/from Stand Sit to Stand: Min assist           General transfer comment: min A to steady with powering up to stand from EOB and toilet, unable to manage L brakes on rollator so therapist assisting    Ambulation/Gait Ambulation/Gait assistance: Min assist, Min guard Gait Distance (Feet): 12 Feet (+54ft, 20 ft) Assistive device: Rollator (4 wheels) Gait Pattern/deviations: Step-to pattern, Decreased stride length, Shuffle Gait velocity: decreased     General Gait Details: slow, short shuffling steps with increased lateral weight shifting, decreased heel-toe pattern, using rollator to rest L hand on per ortho rec, pt needing min G in open spaces and min A to maneuver around obstacle, pt unable to clear through doorway needing assistance to clear appropriately, limited distances pt requesting seated rest breaks   Stairs             Wheelchair Mobility    Modified Rankin (Stroke Patients Only)  Balance Overall balance assessment: Needs assistance Sitting-balance support: Feet supported Sitting balance-Leahy Scale: Fair     Standing balance support: Reliant on  assistive device for balance, During functional activity, Single extremity supported Standing balance-Leahy Scale: Poor                              Cognition Arousal/Alertness: Awake/alert Behavior During Therapy: Flat affect Overall Cognitive Status: No family/caregiver present to determine baseline cognitive functioning                                 General Comments: pt reports she is in hospital, unaware she had shoulder surgery, reports month is June and year is July, able to follow 1 step commands consistently        Exercises      General Comments        Pertinent Vitals/Pain Pain Assessment Pain Assessment: Faces Faces Pain Scale: Hurts a little bit Pain Location: L shoulder with movement Pain Descriptors / Indicators: Grimacing, Guarding Pain Intervention(s): Limited activity within patient's tolerance, Monitored during session, Repositioned, Ice applied    Home Living                          Prior Function            PT Goals (current goals can now be found in the care plan section) Acute Rehab PT Goals Patient Stated Goal: go to the bathroom PT Goal Formulation: With patient Time For Goal Achievement: 09/04/22 Potential to Achieve Goals: Good Progress towards PT goals: Progressing toward goals    Frequency    Min 1X/week      PT Plan Current plan remains appropriate    Co-evaluation              AM-PAC PT "6 Clicks" Mobility   Outcome Measure  Help needed turning from your back to your side while in a flat bed without using bedrails?: A Little Help needed moving from lying on your back to sitting on the side of a flat bed without using bedrails?: A Little Help needed moving to and from a bed to a chair (including a wheelchair)?: A Little Help needed standing up from a chair using your arms (e.g., wheelchair or bedside chair)?: A Little Help needed to walk in hospital room?: A Little Help needed  climbing 3-5 steps with a railing? : Total 6 Click Score: 16    End of Session Equipment Utilized During Treatment: Gait belt Activity Tolerance: Patient tolerated treatment well Patient left: in chair;with call bell/phone within reach;with chair alarm set Nurse Communication: Mobility status PT Visit Diagnosis: Muscle weakness (generalized) (M62.81);Other abnormalities of gait and mobility (R26.89);Pain Pain - Right/Left: Left Pain - part of body: Shoulder     Time: 1340-1405 PT Time Calculation (min) (ACUTE ONLY): 25 min  Charges:  $Gait Training: 8-22 mins $Therapeutic Activity: 8-22 mins                     Tori Briley Sulton PT, DPT 08/24/22, 3:13 PM

## 2022-08-24 NOTE — TOC Progression Note (Addendum)
Transition of Care [facility]) - Progression Note    Patient Details  Name: [name] MRN: [id number] Date of Birth: Sep 19, 1950  [facility]) CM/SW Contact  Armanda Heritage, RN Phone Number: 08/24/2022, 10:59 AM  Clinical Narrative:    CM met with patient at bedside, reviewed SNF recommendations and short term rehab placement, patient remains adamant thst she doe snot want to go to SNF and is requesting this CM set up a ride home for her today.  CM discussed that patient lives alone and while she has aides they are not there 24/7.  Patient reports she can manage her care and wants to go home, explained that SNF rehab is short term and patient would be able to return home post rehab, this would not impact her apartment or food stamps which patient asked.  CM also went over all bed offers and medicare ratings.  Patient maintains she does not want to go to snf and wishes to discharge home.  Son updated of this information with patient permission. Son reports he can pick patient up from hospital on day of dc.  VM left for MD requesting call back to update on this information.  Addendum 1126 am: Per Hospitalist MD anticipated discharge is 1-2 days.  Patients son updated with this information.   Expected Discharge Plan: Home/Self Care Barriers to Discharge: Continued Medical Work up  Expected Discharge Plan and Services In-house Referral: NA Discharge Planning Services: NA Post Acute Care Choice: NA Living arrangements for the past 2 months: Apartment Expected Discharge Date: 08/23/22               DME Arranged: N/A DME Agency: NA                   Social Determinants of Health (SDOH) Interventions SDOH Screenings   Food Insecurity: No Food Insecurity (08/20/2022)  Housing: Patient Declined (08/20/2022)  Transportation Needs: No Transportation Needs (08/20/2022)  Utilities: Not At Risk (08/20/2022)  Alcohol Screen: Low Risk  (07/17/2022)  Depression (PHQ2-9): Low Risk   (08/03/2022)  Recent Concern: Depression (PHQ2-9) - High Risk (05/25/2022)  Financial Resource Strain: Low Risk  (07/17/2022)  Physical Activity: Insufficiently Active (07/17/2022)  Social Connections: Moderately Integrated (07/17/2022)  Stress: No Stress Concern Present (07/17/2022)  Tobacco Use: Medium Risk (08/18/2022)    Readmission Risk Interventions    08/21/2022   11:54 AM 09/16/2021   10:07 AM  Readmission Risk Prevention Plan  Transportation Screening Complete Complete  Medication Review Oceanographer) Complete Complete  PCP or Specialist appointment within 3-5 days of discharge Complete   HRI or Home Care Consult Complete Complete  SW Recovery Care/Counseling Consult Complete Complete  Palliative Care Screening Not Applicable Not Applicable  Skilled Nursing Facility Not Applicable Patient Refused

## 2022-08-24 NOTE — Progress Notes (Signed)
   08/24/22 2240  BiPAP/CPAP/SIPAP  Reason BIPAP/CPAP not in use Non-compliant (RT available if patient changes their mind.  RN aware.)

## 2022-08-24 NOTE — Care Management Important Message (Signed)
Important Message  Patient Details IM Letter given. Name: [name] MRN: [id number] Date of Birth: [birth year]   Medicare Important Message Given:  Yes     Caren Macadam 08/24/2022, 10:37 AM

## 2022-08-24 NOTE — Progress Notes (Signed)
Occupational Therapy Treatment Patient Details Name: [name] MRN: [id number] DOB: 15-Jul-1950 Today's Date: 08/24/2022   History of present illness [name] is an 72 y.o. female past medical history significant for essential hypertension, bipolar disorder, insulin-dependent diabetes mellitus type 2, was admitted to the hospital for an elective left shoulder replacement on 08/18/2022 with agitation and somnolence to follow and concern for polypharmacy.  Initially seen by hospitalist due to uncontrolled hypertension which improved after resuming her home meds.   OT comments  The pt's sling was noted to be off and not near the pt, upon this OT entering her room. The pt was unable to recall how long and why the sling was removed. OT educated her on the sling wear schedule and the importance of wearing it. OT further reinforced LUE elbow, wrist, and hand ROM, with the pt demonstrating decreased recall in this regard. She subsequently required max assist to donn her sling seated EOB. At current, she continues to be with impaired ADL performance and a high falls and risk. Post-acute care therapy services are recommended. Patient will benefit from continued inpatient follow up therapy, <3 hours/day.    Recommendations for follow up therapy are one component of a multi-disciplinary discharge planning process, led by the attending physician.  Recommendations may be updated based on patient status, additional functional criteria and insurance authorization.    Assistance Recommended at Discharge Frequent or constant Supervision/Assistance  Patient can return home with the following  A lot of help with bathing/dressing/bathroom;Assistance with cooking/housework;Direct supervision/assist for medications management;Assist for transportation;Help with stairs or ramp for entrance   Equipment Recommendations  None recommended by OT       Precautions / Restrictions Precautions Precautions:  Shoulder;Fall Type of Shoulder Precautions: no ROM shoulder, ok for hand wrist and elbow AROM, Shoulder Interventions: Shoulder sling/immobilizer;At all times;Off for dressing/bathing/exercises Required Braces or Orthoses: Sling Restrictions Weight Bearing Restrictions: Yes LUE Weight Bearing: Non weight bearing       Mobility Bed Mobility Overal bed mobility: Needs Assistance Bed Mobility: Supine to Sit     Supine to sit: Min guard, HOB elevated Sit to supine: Min guard        T     Balance       Sitting balance - Comments: static sitting-good. dynamic sitting-fair+         ADL either performed or assessed with clinical judgement   ADL Overall ADL's : Needs assistance/impaired Eating/Feeding: Set up;Sitting Eating/Feeding Details (indicate cue type and reason): based on clinical judgement             Upper Body Dressing : Maximal assistance Upper Body Dressing Details (indicate cue type and reason): She required increased assist for donning her LUE sling in sitting, as the sling was noted to be off upon this OT entering her room. Lower Body Dressing: Maximal assistance;Sit to/from stand   Toilet Transfer: Ambulation;Regular Toilet;Grab bars;Minimal assistance                             Cognition Arousal/Alertness: Awake/alert Behavior During Therapy: Flat affect Overall Cognitive Status: No family/caregiver present to determine baseline cognitive functioning        General Comments: decreased insight, she reported the year to be 2025, followed 1 step commands without difficulty, she had difficulty with more complex problem solving                   Pertinent Vitals/  Pain       Pain Assessment Pain Location: She reported having "a little" L hand pain. Pain Intervention(s): Limited activity within patient's tolerance, Monitored during session         Frequency  Min 1X/week        Progress Toward Goals  OT Goals(current  goals can now be found in the care plan section)     Acute Rehab OT Goals Patient Stated Goal: to return home at discharge OT Goal Formulation: With patient Time For Goal Achievement: 09/02/22 Potential to Achieve Goals: Good  Plan Discharge plan remains appropriate    AM-PAC OT "6 Clicks" Daily Activity     Outcome Measure   Help from another person eating meals?: None Help from another person taking care of personal grooming?: A Little Help from another person toileting, which includes using toliet, bedpan, or urinal?: A Lot Help from another person bathing (including washing, rinsing, drying)?: A Lot Help from another person to put on and taking off regular upper body clothing?: A Lot Help from another person to put on and taking off regular lower body clothing?: A Lot 6 Click Score: 15    End of Session Equipment Utilized During Treatment: Other (comment) (none)  OT Visit Diagnosis: Unsteadiness on feet (R26.81);Pain Pain - Right/Left: Left Pain - part of body: Hand   Activity Tolerance Patient tolerated treatment well   Patient Left in bed;with call bell/phone within reach;with bed alarm set   Nurse Communication Other (comment) (informed nurse tech of pt's sling wear schedule, as the sling was off upon this OT entering the room)        Time: 9604-5409 OT Time Calculation (min): 14 min  Charges: OT General Charges $OT Visit: 1 Visit OT Treatments $Therapeutic Activity: 8-22 mins     [name], OTR/L 08/24/2022, 1:07 PM

## 2022-08-24 NOTE — Progress Notes (Signed)
Mobility Specialist - Progress Note   08/24/22 0857  Mobility  Activity Ambulated with assistance to bathroom  Level of Assistance Contact guard assist, steadying assist  Assistive Device None  Distance Ambulated (ft) 25 ft  Range of Motion/Exercises Active  Activity Response Tolerated well  Mobility Referral Yes  $Mobility charge 1 Mobility  Mobility Specialist Start Time (ACUTE ONLY) 0847  Mobility Specialist Stop Time (ACUTE ONLY) 0857  Mobility Specialist Time Calculation (min) (ACUTE ONLY) 10 min   Pt received at EOB with alarm going off. Insisting on using restroom.   Assisted to restroom and returned to chair with all needs met. Pt did not know where she was nor why she was here.   Pt educated on where and why. Chair alarm on.  Marilynne Halsted Mobility Specialist

## 2022-08-25 DIAGNOSIS — F339 Major depressive disorder, recurrent, unspecified: Secondary | ICD-10-CM | POA: Diagnosis not present

## 2022-08-25 DIAGNOSIS — I10 Essential (primary) hypertension: Secondary | ICD-10-CM | POA: Diagnosis not present

## 2022-08-25 DIAGNOSIS — F319 Bipolar disorder, unspecified: Secondary | ICD-10-CM | POA: Diagnosis not present

## 2022-08-25 DIAGNOSIS — R1311 Dysphagia, oral phase: Secondary | ICD-10-CM | POA: Diagnosis not present

## 2022-08-25 DIAGNOSIS — M1990 Unspecified osteoarthritis, unspecified site: Secondary | ICD-10-CM | POA: Diagnosis not present

## 2022-08-25 DIAGNOSIS — Z96612 Presence of left artificial shoulder joint: Secondary | ICD-10-CM | POA: Diagnosis not present

## 2022-08-25 DIAGNOSIS — M6281 Muscle weakness (generalized): Secondary | ICD-10-CM | POA: Diagnosis not present

## 2022-08-25 DIAGNOSIS — R4182 Altered mental status, unspecified: Secondary | ICD-10-CM | POA: Diagnosis not present

## 2022-08-25 DIAGNOSIS — R410 Disorientation, unspecified: Secondary | ICD-10-CM | POA: Diagnosis not present

## 2022-08-25 DIAGNOSIS — N390 Urinary tract infection, site not specified: Secondary | ICD-10-CM | POA: Diagnosis not present

## 2022-08-25 DIAGNOSIS — I69354 Hemiplegia and hemiparesis following cerebral infarction affecting left non-dominant side: Secondary | ICD-10-CM | POA: Diagnosis not present

## 2022-08-25 DIAGNOSIS — Z4789 Encounter for other orthopedic aftercare: Secondary | ICD-10-CM | POA: Diagnosis not present

## 2022-08-25 DIAGNOSIS — F419 Anxiety disorder, unspecified: Secondary | ICD-10-CM | POA: Diagnosis not present

## 2022-08-25 DIAGNOSIS — F32A Depression, unspecified: Secondary | ICD-10-CM | POA: Diagnosis not present

## 2022-08-25 DIAGNOSIS — K219 Gastro-esophageal reflux disease without esophagitis: Secondary | ICD-10-CM | POA: Diagnosis not present

## 2022-08-25 DIAGNOSIS — E039 Hypothyroidism, unspecified: Secondary | ICD-10-CM | POA: Diagnosis not present

## 2022-08-25 DIAGNOSIS — D649 Anemia, unspecified: Secondary | ICD-10-CM | POA: Diagnosis not present

## 2022-08-25 DIAGNOSIS — R2689 Other abnormalities of gait and mobility: Secondary | ICD-10-CM | POA: Diagnosis not present

## 2022-08-25 DIAGNOSIS — E119 Type 2 diabetes mellitus without complications: Secondary | ICD-10-CM | POA: Diagnosis not present

## 2022-08-25 DIAGNOSIS — Z743 Need for continuous supervision: Secondary | ICD-10-CM | POA: Diagnosis not present

## 2022-08-25 DIAGNOSIS — R739 Hyperglycemia, unspecified: Secondary | ICD-10-CM | POA: Diagnosis not present

## 2022-08-25 DIAGNOSIS — I959 Hypotension, unspecified: Secondary | ICD-10-CM | POA: Diagnosis not present

## 2022-08-25 DIAGNOSIS — E559 Vitamin D deficiency, unspecified: Secondary | ICD-10-CM | POA: Diagnosis not present

## 2022-08-25 DIAGNOSIS — R41841 Cognitive communication deficit: Secondary | ICD-10-CM | POA: Diagnosis not present

## 2022-08-25 DIAGNOSIS — E785 Hyperlipidemia, unspecified: Secondary | ICD-10-CM | POA: Diagnosis not present

## 2022-08-25 DIAGNOSIS — J449 Chronic obstructive pulmonary disease, unspecified: Secondary | ICD-10-CM | POA: Diagnosis not present

## 2022-08-25 DIAGNOSIS — R5381 Other malaise: Secondary | ICD-10-CM | POA: Diagnosis not present

## 2022-08-25 LAB — GLUCOSE, CAPILLARY
Glucose-Capillary: 87 mg/dL (ref 70–99)
Glucose-Capillary: 199 mg/dL — ABNORMAL HIGH (ref 70–99)
Glucose-Capillary: 81 mg/dL (ref 70–99)

## 2022-08-25 NOTE — TOC Progression Note (Signed)
Transition of Care [facility]) - Progression Note    Patient Details  Name: [name] MRN: [id number] Date of Birth: 02/10/1951  Transition of Care [facility]) CM/SW Contact  Armanda Heritage, RN Phone Number: 08/25/2022, 9:30 AM  Clinical Narrative:     CM spoke with patient who was able to identify her name, DOB, reason for hospitalization, location and Month and year, did believe today was Sunday and could not identify the day.  Patient was able to correctly summarize that SNF rehab is recommended though she has been resistant to this.  This CM once again discussed SNF rehab, and discussed patient's performance with therapy session yesterday. Patient still hesitant but does acknowledge that rehab would be of benefit to her and feels she would be agreeable to go though not certain of which facility is best option.  Once again discussed facility choices and patient did give permission for CM to speak with son and request his assistance with making choice, patient also requests that son call her.  This CM spoke with son, verbally discussed facility choices and provided a summary email. At this time son [facility] rehab, however will also call patient and update this CM if choice changes.  CM will initiate auth for [place] rehab at this time.  Expected Discharge Plan: Home/Self Care Barriers to Discharge: Continued Medical Work up  Expected Discharge Plan and Services In-house Referral: NA Discharge Planning Services: NA Post Acute Care Choice: NA Living arrangements for the past 2 months: Apartment Expected Discharge Date: 08/24/22               DME Arranged: N/A DME Agency: NA                   Social Determinants of Health (SDOH) Interventions SDOH Screenings   Food Insecurity: No Food Insecurity (08/20/2022)  Housing: Patient Declined (08/20/2022)  Transportation Needs: No Transportation Needs (08/20/2022)  Utilities: Not At Risk (08/20/2022)  Alcohol Screen: Low Risk  (07/17/2022)   Depression (PHQ2-9): Low Risk  (08/03/2022)  Recent Concern: Depression (PHQ2-9) - High Risk (05/25/2022)  Financial Resource Strain: Low Risk  (07/17/2022)  Physical Activity: Insufficiently Active (07/17/2022)  Social Connections: Moderately Integrated (07/17/2022)  Stress: No Stress Concern Present (07/17/2022)  Tobacco Use: Medium Risk (08/24/2022)    Readmission Risk Interventions    08/21/2022   11:54 AM 09/16/2021   10:07 AM  Readmission Risk Prevention Plan  Transportation Screening Complete Complete  Medication Review Oceanographer) Complete Complete  PCP or Specialist appointment within 3-5 days of discharge Complete   HRI or Home Care Consult Complete Complete  SW Recovery Care/Counseling Consult Complete Complete  Palliative Care Screening Not Applicable Not Applicable  Skilled Nursing Facility Not Applicable Patient Refused

## 2022-08-25 NOTE — Progress Notes (Addendum)
Occupational Therapy Treatment Patient Details Name: [name] MRN: [id number] DOB: August 22, 1950 Today's Date: 08/25/2022   History of present illness Patient is a 72 year old female who presented on 6/7 for elective left shoulder arthroplasty. Post op patient was having difficulty with waking up thus transitioned to SDU. Patient also noted to have HTN. PMH: anxiety, brain tumor, cancer, glaucoma, major depression, narcolepsy, seizures, stroke with L side weakness, back surgery, craniotomy, carpal tunnel release L side,   OT comments  Pt was found seated in the bedside chair with her LUE sling off. She was unable to recall how long and why the sling was off. She needed max assist to donn sling. She ambulated to the bathroom for toileting, requiring slightly decreased assist for toileting tasks overall, though not yet independent in this regard. She continues to demonstrate intermittent unsteadiness in standing. She reported LUE discomfort, with OT educating her on use of ice and sling to help alleviate symptoms. Continue OT plan of care.    Recommendations for follow up therapy are one component of a multi-disciplinary discharge planning process, led by the attending physician.  Recommendations may be updated based on patient status, additional functional criteria and insurance authorization.    Assistance Recommended at Discharge Frequent or constant Supervision/Assistance  Patient can return home with the following  A lot of help with bathing/dressing/bathroom;Assistance with cooking/housework;Direct supervision/assist for medications management;Assist for transportation;Help with stairs or ramp for entrance   Equipment Recommendations  None recommended by OT       Precautions / Restrictions Precautions Precautions: Shoulder;Fall Type of Shoulder Precautions: no ROM shoulder, ok for hand wrist and elbow AROM, Shoulder Interventions: Shoulder sling/immobilizer;At all times;Off for  dressing/bathing/exercises Required Braces or Orthoses: Sling Restrictions Weight Bearing Restrictions: Yes LUE Weight Bearing: Non weight bearing       Mobility Bed Mobility   Bed Mobility: Sit to Supine       Sit to supine: Min guard        Transfers Overall transfer level: Needs assistance   Transfers: Sit to/from Stand Sit to Stand: Min assist                     ADL either performed or assessed with clinical judgement   ADL Overall ADL's : Needs assistance/impaired Eating/Feeding: Set up;Sitting Eating/Feeding Details (indicate cue type and reason): based on clinical judgement Grooming: Min guard;Standing             Upper Body Dressing Details (indicate cue type and reason): She required increased assist for donning her LUE sling in sitting, as the sling was noted to be off upon this OT entering her room. Lower Body Dressing: Moderate assistance;Sit to/from stand   Toilet Transfer: Ambulation;Regular Toilet;Grab bars;Minimal assistance Toilet Transfer Details (indicate cue type and reason): The pt was assisted to the bathroom requiring HHA on the R for steadying. She required verbal cues for getting closer to the toilet, prior to attempting to sit, as well to use grab bar with RUE as needed. Toileting- Clothing Manipulation and Hygiene: Minimal assistance;Sit to/from stand Toileting - Clothing Manipulation Details (indicate cue type and reason): She performed hygiene in standing with min guard assist, however required assist for clothing management.              Cognition Arousal/Alertness: Awake/alert Behavior During Therapy: WFL for tasks assessed/performed Overall Cognitive Status: No family/caregiver present to determine baseline cognitive functioning  Pertinent Vitals/ Pain       Pain Assessment Pain Assessment: Faces Pain Score: 4  Pain Location: L shoulder with  movement Pain Descriptors / Indicators: Grimacing, Sore Pain Intervention(s): Limited activity within patient's tolerance, Monitored during session (Sling placed on)         Frequency  Min 1X/week        Progress Toward Goals  OT Goals(current goals can now be found in the care plan section)  Progress towards OT goals: Progressing toward goals  Acute Rehab OT Goals Patient Stated Goal: to return home at discharge OT Goal Formulation: With patient Time For Goal Achievement: 09/02/22 Potential to Achieve Goals: Good  Plan Discharge plan remains appropriate       AM-PAC OT "6 Clicks" Daily Activity     Outcome Measure   Help from another person eating meals?: A Little Help from another person taking care of personal grooming?: A Little Help from another person toileting, which includes using toliet, bedpan, or urinal?: A Little Help from another person bathing (including washing, rinsing, drying)?: A Lot Help from another person to put on and taking off regular upper body clothing?: A Lot Help from another person to put on and taking off regular lower body clothing?: A Lot 6 Click Score: 15    End of Session    OT Visit Diagnosis: Unsteadiness on feet (R26.81);Pain   Activity Tolerance Patient tolerated treatment well   Patient Left in bed;with call bell/phone within reach;with bed alarm set   Nurse Communication Mobility status        Time: 1345-1359 OT Time Calculation (min): 14 min  Charges: OT General Charges $OT Visit: 1 Visit OT Treatments $Self Care/Home Management : 8-22 mins     Reuben Likes, OTR/L 08/25/2022, 3:18 PM

## 2022-08-25 NOTE — Progress Notes (Signed)
Called report to Fletcher, Charity fundraiser at [facility]

## 2022-08-25 NOTE — Progress Notes (Signed)
Orthopedics Progress Note  Subjective: Patient with minimal shoulder pain. She is feeling much better and more alert per nursing  Objective:  Vitals:   08/24/22 2112 08/25/22 0412  BP: (!) 143/58 138/61  Pulse: 76 65  Resp: 20 (!) 22  Temp: 98.9 F (37.2 C) 98 F (36.7 C)  SpO2: 100% 100%    General: Awake and alert  Musculoskeletal: Left shoulder dressing removed. Steri strips placed. Will leave open to air. No pain with AAROM of the shoulder Neurovascularly intact  Lab Results  Component Value Date   WBC 10.2 08/22/2022   HGB 10.8 (L) 08/22/2022   HCT 36.4 08/22/2022   MCV 77.1 (L) 08/22/2022   PLT 290 08/22/2022       Component Value Date/Time   NA 136 08/24/2022 0420   NA 144 06/29/2022 1150   K 5.1 08/24/2022 0420   CL 107 08/24/2022 0420   CO2 21 (L) 08/24/2022 0420   GLUCOSE 92 08/24/2022 0420   BUN 38 (H) 08/24/2022 0420   BUN 28 (H) 06/29/2022 1150   CREATININE 1.05 (H) 08/24/2022 0420   CREATININE 1.24 (H) 09/30/2018 1143   CALCIUM 8.6 (L) 08/24/2022 0420   GFRNONAA 56 (L) 08/24/2022 0420   GFRNONAA 45 (L) 09/30/2018 1143   GFRAA 72 12/11/2019 1224   GFRAA 52 (L) 09/30/2018 1143    Lab Results  Component Value Date   INR 1.01 10/15/2017   INR 1.07 10/15/2016   INR 1.14 02/21/2015    Assessment/Plan: POD #6 s/p Procedure(s): REVERSE SHOULDER ARTHROPLASTY Stable for discharge to SNF or home depending on insurance Follow up in the office in two weeks  Almedia Balls. Ranell Patrick, MD 08/25/2022 1:35 PM

## 2022-08-25 NOTE — Discharge Summary (Signed)
In most cases prophylactic antibiotics for Dental procdeures after total joint surgery are not necessary.  Exceptions are as follows:  1. History of prior total joint infection  2. Severely immunocompromised (Organ Transplant, cancer chemotherapy, Rheumatoid biologic meds such as Humera)  3. Poorly controlled diabetes (A1C &gt; 8.0, blood glucose over 200)  If you have one of these conditions, contact your surgeon for an antibiotic prescription, prior to your dental procedure. Orthopedic Discharge Summary        Physician Discharge Summary  Patient ID: [name] MRN: [id number] DOB/AGE: 1950-03-19 72 y.o.  Admit date: 08/18/2022 Discharge date: 08/25/2022   Procedures:  Procedure(s) (LRB): REVERSE SHOULDER ARTHROPLASTY (Left)  Attending Physician:  Dr. Malon Kindle  Admission Diagnoses:   left shoulder OA  Discharge Diagnoses:  same   Past Medical History:  Diagnosis Date   Allergy    Anemia    Anemia in chronic kidney disease (CKD) 08/10/2021   Anemia in chronic kidney disease (CKD) 10/12/2021   Anxiety    takes Ativan daily   Arthritis    Assistance needed for mobility    Bipolar disorder (HCC)    takes Risperdal nightly   Blood transfusion    Brain tumor (HCC)    Cancer (HCC)    In her gum   Carpal tunnel syndrome of right wrist 05/23/2011   Cervical disc disorder with radiculopathy of cervical region 10/31/2012   Chronic back pain    Chronic idiopathic constipation    Chronic neck and back pain    Colon polyps    COPD (chronic obstructive pulmonary disease) with chronic bronchitis 09/16/2013   Office Spirometry 10/30/2013-submaximal effort based on appearance of loop and curve. Numbers would fit with severe restriction but her physiologic capability may be better than this. FVC 0.91/44%, and 10.74/45%, FEV1/FVC 0.81, FEF 25-75% 1.43/69%     Diabetes mellitus    Type II   Diverticulosis    TCS 9/08 by Dr. Lina Sar for diarrhea . Bx for micro  scopic colitis negative.    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   Metabolic encephalopathy 08/03/2011   Migraines    chronic headaches   Mononeuritis lower limb    Narcolepsy    Osteoporosis    Pancreatitis 2006   due to Depakote with normal EUS    Paralysis (HCC)    Pneumonia    Schatzki's ring    non critical / EGD with ED 8/2011with RMR   Seizures (HCC)    takes Lamictal daily.Last seizure 3 yrs ago   Sleep apnea    on CPAP   Small bowel obstruction (HCC)    Stroke (HCC)    left sided weakness, speech changes   Tubular adenoma of colon     PCP: Kerri Perches, MD   Discharged Condition: stable  Hospital Course:  Patient underwent the above stated procedure on 08/18/2022. Patient  tolerated the procedure well and brought to the recovery room in good condition and subsequently to the floor. Patient had an uncomplicated hospital course and was stable for discharge.   Disposition: Discharge disposition: 03-Skilled Nursing Facility      with follow up in 2 weeks    Contact information for follow-up providers     Durwin Nora, Katharina Caper, PA-C. Go on 08/31/2022.   Specialty: Physician Assistant Why: **You are scheduled for first post op appt on Thursday June 20 at 11:00am.** Contact information: [address] [place] [address] [place] [address] [phone]         Beverely Low, MD. Call in 2 week(s).   Specialty: Orthopedic Surgery Why: call [phone] for appt in two  weeks Contact information: [address] [place] [address] [phone]              Contact information for after-discharge care     Destination     [facility] .   Service: Skilled Nursing Contact information: [address] [place] [address] [phone]                     Dental Antibiotics:  In most cases prophylactic antibiotics for Dental procdeures after total joint surgery are not necessary.  Exceptions are as follows:  1. History of prior total joint infection  2. Severely immunocompromised (Organ Transplant, cancer chemotherapy, Rheumatoid biologic meds such as Humera)  3. Poorly controlled diabetes (A1C &gt; 8.0, blood glucose over 200)  If you have one of these conditions, contact your surgeon for an antibiotic prescription, prior to your dental procedure.  Discharge Instructions     Call MD / Call 911   Complete by: As directed    If you experience chest pain or shortness of breath, CALL 911 and be transported to the hospital emergency room.  If you develope a fever above 101 F, pus (white drainage) or increased drainage or redness at the wound, or calf pain, call your surgeon's office.   Call MD / Call 911   Complete by: As directed    If you experience chest pain or shortness of breath, CALL 911 and be transported to the hospital emergency room.  If you develope a fever above 101 F, pus (white drainage) or increased drainage or redness at the wound, or calf pain, call your surgeon's office.   Call MD / Call 911   Complete by: As directed    If you experience chest pain or shortness of breath, CALL 911 and be transported to the hospital emergency room.  If you develope a fever above 101 F, pus (white drainage) or increased drainage or redness at the wound, or calf pain, call your surgeon's office.   Call MD / Call 911   Complete by: As directed    If you experience chest pain  or shortness of breath, CALL 911 and be transported to the hospital emergency room.  If you develope a fever above 101 F, pus (white drainage) or increased drainage or redness at the wound, or calf pain, call your surgeon's office.   Constipation Prevention   Complete by: As directed    Drink plenty of fluids.  Prune juice may be helpful.  You may use a stool softener, such as Colace (over the counter) 100 mg twice a day.  Use MiraLax (over the counter) for constipation as needed.   Constipation Prevention   Complete by: As directed  Drink plenty of fluids.  Prune juice may be helpful.  You may use a stool softener, such as Colace (over the counter) 100 mg twice a day.  Use MiraLax (over the counter) for constipation as needed.   Constipation Prevention   Complete by: As directed    Drink plenty of fluids.  Prune juice may be helpful.  You may use a stool softener, such as Colace (over the counter) 100 mg twice a day.  Use MiraLax (over the counter) for constipation as needed.   Constipation Prevention   Complete by: As directed    Drink plenty of fluids.  Prune juice may be helpful.  You may use a stool softener, such as Colace (over the counter) 100 mg twice a day.  Use MiraLax (over the counter) for constipation as needed.   Diet - low sodium heart healthy   Complete by: As directed    Diet - low sodium heart healthy   Complete by: As directed    Increase activity slowly as tolerated   Complete by: As directed    Increase activity slowly as tolerated   Complete by: As directed    Increase activity slowly as tolerated   Complete by: As directed    Increase activity slowly as tolerated   Complete by: As directed    Post-operative opioid taper instructions:   Complete by: As directed    POST-OPERATIVE OPIOID TAPER INSTRUCTIONS: It is important to wean off of your opioid medication as soon as possible. If you do not need pain medication after your surgery it is ok to stop day  one. Opioids include: Codeine, Hydrocodone(Norco, Vicodin), Oxycodone(Percocet, oxycontin) and hydromorphone amongst others.  Long term and even short term use of opiods can cause: Increased pain response Dependence Constipation Depression Respiratory depression And more.  Withdrawal symptoms can include Flu like symptoms Nausea, vomiting And more Techniques to manage these symptoms Hydrate well Eat regular healthy meals Stay active Use relaxation techniques(deep breathing, meditating, yoga) Do Not substitute Alcohol to help with tapering If you have been on opioids for less than two weeks and do not have pain than it is ok to stop all together.  Plan to wean off of opioids This plan should start within one week post op of your joint replacement. Maintain the same interval or time between taking each dose and first decrease the dose.  Cut the total daily intake of opioids by one tablet each day Next start to increase the time between doses. The last dose that should be eliminated is the evening dose.      Post-operative opioid taper instructions:   Complete by: As directed    POST-OPERATIVE OPIOID TAPER INSTRUCTIONS: It is important to wean off of your opioid medication as soon as possible. If you do not need pain medication after your surgery it is ok to stop day one. Opioids include: Codeine, Hydrocodone(Norco, Vicodin), Oxycodone(Percocet, oxycontin) and hydromorphone amongst others.  Long term and even short term use of opiods can cause: Increased pain response Dependence Constipation Depression Respiratory depression And more.  Withdrawal symptoms can include Flu like symptoms Nausea, vomiting And more Techniques to manage these symptoms Hydrate well Eat regular healthy meals Stay active Use relaxation techniques(deep breathing, meditating, yoga) Do Not substitute Alcohol to help with tapering If you have been on opioids for less than two weeks and do not have  pain than it is ok to stop all together.  Plan to wean off of opioids This plan should  start within one week post op of your joint replacement. Maintain the same interval or time between taking each dose and first decrease the dose.  Cut the total daily intake of opioids by one tablet each day Next start to increase the time between doses. The last dose that should be eliminated is the evening dose.      Post-operative opioid taper instructions:   Complete by: As directed    POST-OPERATIVE OPIOID TAPER INSTRUCTIONS: It is important to wean off of your opioid medication as soon as possible. If you do not need pain medication after your surgery it is ok to stop day one. Opioids include: Codeine, Hydrocodone(Norco, Vicodin), Oxycodone(Percocet, oxycontin) and hydromorphone amongst others.  Long term and even short term use of opiods can cause: Increased pain response Dependence Constipation Depression Respiratory depression And more.  Withdrawal symptoms can include Flu like symptoms Nausea, vomiting And more Techniques to manage these symptoms Hydrate well Eat regular healthy meals Stay active Use relaxation techniques(deep breathing, meditating, yoga) Do Not substitute Alcohol to help with tapering If you have been on opioids for less than two weeks and do not have pain than it is ok to stop all together.  Plan to wean off of opioids This plan should start within one week post op of your joint replacement. Maintain the same interval or time between taking each dose and first decrease the dose.  Cut the total daily intake of opioids by one tablet each day Next start to increase the time between doses. The last dose that should be eliminated is the evening dose.      Post-operative opioid taper instructions:   Complete by: As directed    POST-OPERATIVE OPIOID TAPER INSTRUCTIONS: It is important to wean off of your opioid medication as soon as possible. If you do not need pain  medication after your surgery it is ok to stop day one. Opioids include: Codeine, Hydrocodone(Norco, Vicodin), Oxycodone(Percocet, oxycontin) and hydromorphone amongst others.  Long term and even short term use of opiods can cause: Increased pain response Dependence Constipation Depression Respiratory depression And more.  Withdrawal symptoms can include Flu like symptoms Nausea, vomiting And more Techniques to manage these symptoms Hydrate well Eat regular healthy meals Stay active Use relaxation techniques(deep breathing, meditating, yoga) Do Not substitute Alcohol to help with tapering If you have been on opioids for less than two weeks and do not have pain than it is ok to stop all together.  Plan to wean off of opioids This plan should start within one week post op of your joint replacement. Maintain the same interval or time between taking each dose and first decrease the dose.  Cut the total daily intake of opioids by one tablet each day Next start to increase the time between doses. The last dose that should be eliminated is the evening dose.          Allergies as of 08/25/2022       Reactions   Iron Itching, Nausea And Vomiting   Milk (cow) Rash, Other (See Comments)   Doesn't agree with the stomach, also   Penicillins Hives   Phenazopyridine Hives   Cephalexin Hives   Flonase [fluticasone] Other (See Comments)   "It gave me ulcers in my nose"   Milk-related Compounds Nausea Only, Other (See Comments)   Doesn't agree with the stomach        Medication List     STOP taking these medications    diazepam 5 MG  tablet Commonly known as: VALIUM   hydrOXYzine 10 MG tablet Commonly known as: ATARAX   LORazepam 0.5 MG tablet Commonly known as: ATIVAN   oxyCODONE-acetaminophen 10-325 MG tablet Commonly known as: PERCOCET   pregabalin 75 MG capsule Commonly known as: LYRICA   risperiDONE 0.5 MG tablet Commonly known as: RISPERDAL   traZODone 150  MG tablet Commonly known as: DESYREL       TAKE these medications    Accu-Chek Guide Control Liqd USE AS DIRECTED   Accu-Chek Guide test strip Generic drug: glucose blood USE TO CHECK BLOOD SUGAR FOUR TIMES A DAY AND AS NEEDED (NEED MD APPOINTMENT FOR ANY FURTHER REFILLS)   Accu-Chek Softclix Lancets lancets TEST BLOOD SUGAR THREE TIMES DAILY AS DIRECTED   acetaminophen 325 MG tablet Commonly known as: TYLENOL Take 650 mg by mouth every 6 (six) hours as needed for mild pain, moderate pain or headache.   Adjustable Arm Sling Misc L arm sling   albuterol 108 (90 Base) MCG/ACT inhaler Commonly known as: VENTOLIN HFA INHALE 1 TO 2 PUFFS EVERY 6 HOURS AS NEEDED FOR WHEEZING, SHORTNESS OF BREATH What changed: See the new instructions.   albuterol (2.5 MG/3ML) 0.083% nebulizer solution Commonly known as: PROVENTIL Take 3 mLs (2.5 mg total) by nebulization every 6 (six) hours as needed for wheezing or shortness of breath. What changed: Another medication with the same name was changed. Make sure you understand how and when to take each.   alendronate 70 MG tablet Commonly known as: FOSAMAX TAKE 1 TABLET EVERY 7 DAYS ON AN EMPTY STOMACH WITH A FULL GLASS OF WATER What changed: See the new instructions.   amLODipine 10 MG tablet Commonly known as: NORVASC Take 1 tablet (10 mg total) by mouth daily.   ascorbic acid 500 MG tablet Commonly known as: VITAMIN C Take 500 mg by mouth daily.   aspirin EC 81 MG tablet Take 1 tablet (81 mg total) by mouth daily with breakfast.   benzonatate 200 MG capsule Commonly known as: TESSALON Take 1 capsule (200 mg total) by mouth 3 (three) times daily as needed for cough.   blood glucose meter kit and supplies Dispense based on patient and insurance preference. Use up to four times daily as directed. (FOR ICD-10 E10.9, E11.9).   buPROPion 150 MG 24 hr tablet Commonly known as: Wellbutrin XL Take 1 tablet (150 mg total) by mouth every  morning.   Caltrate 600+D3 Soft 600-20 MG-MCG Chew Generic drug: Calcium Carb-Cholecalciferol Chew 1 tablet by mouth daily.   cetirizine 10 MG tablet Commonly known as: ZYRTEC Take 1 tablet (10 mg total) by mouth daily.   clobetasol 0.05 % external solution Commonly known as: TEMOVATE Apply 1 Application topically 2 (two) times daily. What changed:  when to take this additional instructions   D3 PO Take 1 tablet by mouth daily.   Droplet Pen Needles 31G X 8 MM Misc Generic drug: Insulin Pen Needle USE FOR INJECTING INSULIN 4 TIMES DAILY.   DropSafe Alcohol Prep 70 % Pads USE TO CLEAN FINGER PRIOR TO TESTING FOR BLOOD SUGAR AS DIRECTED   DSS 100 MG Caps Take 100 mg by mouth in the morning and at bedtime.   ezetimibe 10 MG tablet Commonly known as: ZETIA TAKE 1 TABLET EVERY DAY   FISH OIL PO Take 1 capsule by mouth daily.   FreeStyle Libre 14 Day Sensor Misc 1 each by Does not apply route every 14 (fourteen) days. Change every 2 weeks What changed:  how much to take how to take this additional instructions   ipratropium 0.03 % nasal spray Commonly known as: ATROVENT Place 2 sprays into both nostrils every 12 (twelve) hours.   lactobacillus acidophilus Tabs tablet Take 2 tablets by mouth 3 (three) times daily.   lamoTRIgine 100 MG tablet Commonly known as: LAMICTAL Take 1 tablet (100 mg total) by mouth 2 (two) times daily.   levothyroxine 50 MCG tablet Commonly known as: SYNTHROID TAKE 1 TABLET DAILY SIX DAYS A WEEK AND 1/2 TABLET ON ONE DAY A WEEK What changed:  how much to take how to take this when to take this additional instructions   Mattress Pad Misc FIRM MATTRESS PAD for hospital bed x 1   methocarbamol 750 MG tablet Commonly known as: Robaxin-750 Take 1 tablet (750 mg total) by mouth every 8 (eight) hours as needed for muscle spasms.   metoprolol tartrate 50 MG tablet Commonly known as: LOPRESSOR TAKE 1 TABLET TWICE DAILY (NEED MD  APPOINTMENT) What changed: See the new instructions.   montelukast 10 MG tablet Commonly known as: SINGULAIR TAKE 1 TABLET EVERY DAY   MULTIVITAMIN GUMMIES ADULT PO Take 1 tablet by mouth daily.   Myrbetriq 25 MG Tb24 tablet Generic drug: mirabegron ER TAKE 1 TABLET EVERY DAY   nicotine 21 mg/24hr patch Commonly known as: Nicoderm CQ Place 1 patch (21 mg total) onto the skin daily.   NovoLOG FlexPen 100 UNIT/ML FlexPen Generic drug: insulin aspart Inject 5 Units into the skin 3 (three) times daily with meals. Give if eats 50% or more of meal. What changed: additional instructions   nystatin powder Commonly known as: MYCOSTATIN/NYSTOP Apply 1 Application topically 2 (two) times daily.   oxyCODONE 5 MG immediate release tablet Commonly known as: Oxy IR/ROXICODONE Take 1 tablet (5 mg total) by mouth every 4 (four) hours as needed for severe pain or breakthrough pain (pain score 4-6).   potassium chloride 10 MEQ tablet Commonly known as: KLOR-CON Take 1 tablet (10 mEq total) by mouth 2 (two) times daily.   RABEprazole 20 MG tablet Commonly known as: ACIPHEX TAKE 1 TABLET TWICE DAILY   Restasis 0.05 % ophthalmic emulsion Generic drug: cycloSPORINE Place 2 drops into both eyes daily.   rosuvastatin 5 MG tablet Commonly known as: CRESTOR TAKE 1 TABLET AT BEDTIME   sertraline 100 MG tablet Commonly known as: ZOLOFT Take 1 tablet (100 mg total) by mouth every morning.   spironolactone 50 MG tablet Commonly known as: ALDACTONE TAKE 1 TABLET (50 MG TOTAL) BY MOUTH DAILY. DISCONTINUE SPIRONOLACTONE 25MG  What changed: See the new instructions.   telmisartan 20 MG tablet Commonly known as: MICARDIS Take 10 mg by mouth daily.   Toujeo Max SoloStar 300 UNIT/ML Solostar Pen Generic drug: insulin glargine (2 Unit Dial) INJECT 20-26 UNITS INTO THE SKIN DAILY. What changed: when to take this   umeclidinium-vilanterol 62.5-25 MCG/ACT Aepb Commonly known as: ANORO  ELLIPTA Inhale 1 puff into the lungs daily.   UNABLE TO FIND Med Name:  G5 Mattress   vitamin E 180 MG (400 UNITS) capsule Generic drug: vitamin E Take 400 Units by mouth daily.       ASK your doctor about these medications    doxycycline 100 MG tablet Commonly known as: VIBRA-TABS Take 1 tablet (100 mg total) by mouth 2 (two) times daily for 10 days. Ask about: Should I take this medication?          Signed: Verlee Rossetti 08/25/2022, 1:37 PM  [facility] is now [facility] [place] [address], [place], [place] [address] Phone: [phone] Facebook  Instagram  [employer]

## 2022-08-25 NOTE — Progress Notes (Signed)
Called and son is aware to come pick up Rolator since cannot transport with [facility].

## 2022-08-25 NOTE — Progress Notes (Signed)
Physical Therapy Treatment Patient Details Name: [name] MRN: [id number] DOB: [birth year] Today's Date: 08/25/2022   History of Present Illness Patient is a 72 year old female who presented on 6/7 for elective left shoulder arthroplasty. Post op patient was having difficulty with waking up thus transitioned to SDU. Patient also noted to have HTN. PMH: anxiety, brain tumor, cancer, glaucoma, major depression, narcolepsy, seizures, stroke with L side weakness, back surgery, craniotomy, carpal tunnel release L side,    PT Comments    Pt appears to be the most cognitively clear this date. Pt reporting she is in the hospital for shoulder replacement by Beverely Low (All correct). Pt able to state she can't use her LUE, pt without sling on upon arrival but when this therapist showed her the sling pt reports it is familiar. Pt needing max A to don sling appropriately. Pt powers to stand with min A to power up, able to manage rollator brakes using R hand for R and L brake appropriately with cues. Pt amb 45 ft x2 with seated rest break, min G for safety and able to clear around bed and through door frames without physical assist this date, verbal cues for resting L hand on rollator and not bearing down. Pt needing min A to steady with pivoting to sit on rollator for seated rest break. Pt still needing min A with transfers and cues for rollator management due to L shoulder replacement, also max A with sling management despite daily education.   Recommendations for follow up therapy are one component of a multi-disciplinary discharge planning process, led by the attending physician.  Recommendations may be updated based on patient status, additional functional criteria and insurance authorization.  Follow Up Recommendations       Assistance Recommended at Discharge Frequent or constant Supervision/Assistance  Patient can return home with the following A little help with walking and/or transfers;A little  help with bathing/dressing/bathroom;Assistance with cooking/housework;Assist for transportation   Equipment Recommendations  None recommended by PT    Recommendations for Other Services       Precautions / Restrictions Precautions Precautions: Shoulder;Fall Type of Shoulder Precautions: no ROM shoulder, ok for hand wrist and elbow AROM, Shoulder Interventions: Shoulder sling/immobilizer;At all times;Off for dressing/bathing/exercises Required Braces or Orthoses: Sling Restrictions Weight Bearing Restrictions: Yes LUE Weight Bearing: Non weight bearing     Mobility  Bed Mobility               General bed mobility comments: in recliner upon arrival    Transfers Overall transfer level: Needs assistance Equipment used: Rollator (4 wheels) Transfers: Sit to/from Stand Sit to Stand: Min assist           General transfer comment: min A to steady with powering up to stand from recliner and rollator, able to reach across body to manage L rollator brakes appropriately, verbal cues for brake management and BLE engagement to power up to stand    Ambulation/Gait Ambulation/Gait assistance: Min guard Gait Distance (Feet): 45 Feet (x2) Assistive device: Rollator (4 wheels) Gait Pattern/deviations: Step-to pattern, Decreased stride length, Shuffle Gait velocity: decreased     General Gait Details: L hand resting on rollator, slow short steps with increased lateral weight-shifting and flat foot gait pattern, able to complete turns with rollator through door ways and maneuver around bed without physical assist or verbal cues; increased time and min A to steady with pivoting around to take seated rest break on rollator   Stairs  Wheelchair Mobility    Modified Rankin (Stroke Patients Only)       Balance Overall balance assessment: Needs assistance         Standing balance support: Reliant on assistive device for balance, During functional activity,  Single extremity supported Standing balance-Leahy Scale: Poor                              Cognition Arousal/Alertness: Awake/alert Behavior During Therapy: WFL for tasks assessed/performed Overall Cognitive Status: No family/caregiver present to determine baseline cognitive functioning                                 General Comments: Pt reports she is in hospital and that she has shoulder replaced by Beverely Low, states month is June, able to follow 1 step commands consistently        Exercises      General Comments        Pertinent Vitals/Pain Pain Assessment Pain Assessment: Faces Faces Pain Scale: Hurts little more Pain Location: L shoulder with movement Pain Descriptors / Indicators: Grimacing, Sore Pain Intervention(s): Limited activity within patient's tolerance, Monitored during session, Repositioned    Home Living                          Prior Function            PT Goals (current goals can now be found in the care plan section) Acute Rehab PT Goals Patient Stated Goal: go to the bathroom PT Goal Formulation: With patient Time For Goal Achievement: 09/04/22 Potential to Achieve Goals: Good Progress towards PT goals: Progressing toward goals    Frequency    Min 1X/week      PT Plan Current plan remains appropriate    Co-evaluation              AM-PAC PT "6 Clicks" Mobility   Outcome Measure  Help needed turning from your back to your side while in a flat bed without using bedrails?: A Little Help needed moving from lying on your back to sitting on the side of a flat bed without using bedrails?: A Little Help needed moving to and from a bed to a chair (including a wheelchair)?: A Little Help needed standing up from a chair using your arms (e.g., wheelchair or bedside chair)?: A Little Help needed to walk in hospital room?: A Little Help needed climbing 3-5 steps with a railing? : Total 6 Click Score:  16    End of Session Equipment Utilized During Treatment: Gait belt Activity Tolerance: Patient tolerated treatment well Patient left: in chair;with call bell/phone within reach;with chair alarm set Nurse Communication: Mobility status PT Visit Diagnosis: Muscle weakness (generalized) (M62.81);Other abnormalities of gait and mobility (R26.89);Pain Pain - Right/Left: Left Pain - part of body: Shoulder     Time: 4098-1191 PT Time Calculation (min) (ACUTE ONLY): 20 min  Charges:  $Gait Training: 8-22 mins                     Tori Samanvitha Germany PT, DPT 08/25/22, 11:42 AM

## 2022-08-25 NOTE — TOC Transition Note (Signed)
Transition of Care [facility]) - CM/SW Discharge Note   Patient Details  Name: [name] MRN: [id number] Date of Birth: 1950-04-11  Transition of Care (TOC) CM/SW Contact:  Armanda Heritage, RN Phone Number: 08/25/2022, 3:35 PM   Clinical Narrative:    Authorization approved.  Patient will [facility] to [facility] room 304-1 report number [phone].  PTAR transport arranged.     Barriers to Discharge: Continued Medical Work up   Patient Goals and CMS Choice CMS Medicare.gov Compare Post Acute Care list provided to:: Patient Choice offered to / list presented to : Patient  Discharge Placement                         Discharge Plan and Services Additional resources added to the After Visit Summary for   In-house Referral: NA Discharge Planning Services: NA Post Acute Care Choice: NA          DME Arranged: N/A DME Agency: NA                  Social Determinants of Health (SDOH) Interventions SDOH Screenings   Food Insecurity: No Food Insecurity (08/20/2022)  Housing: Patient Declined (08/20/2022)  Transportation Needs: No Transportation Needs (08/20/2022)  Utilities: Not At Risk (08/20/2022)  Alcohol Screen: Low Risk  (07/17/2022)  Depression (PHQ2-9): Low Risk  (08/03/2022)  Recent Concern: Depression (PHQ2-9) - High Risk (05/25/2022)  Financial Resource Strain: Low Risk  (07/17/2022)  Physical Activity: Insufficiently Active (07/17/2022)  Social Connections: Moderately Integrated (07/17/2022)  Stress: No Stress Concern Present (07/17/2022)  Tobacco Use: Medium Risk (08/24/2022)     Readmission Risk Interventions    08/21/2022   11:54 AM 09/16/2021   10:07 AM  Readmission Risk Prevention Plan  Transportation Screening Complete Complete  Medication Review Oceanographer) Complete Complete  PCP or Specialist appointment within 3-5 days of discharge Complete   HRI or Home Care Consult Complete Complete  SW Recovery Care/Counseling Consult Complete Complete   Palliative Care Screening Not Applicable Not Applicable  Skilled Nursing Facility Not Applicable Patient Refused

## 2022-08-28 ENCOUNTER — Telehealth: Payer: Self-pay | Admitting: Family Medicine

## 2022-08-28 DIAGNOSIS — F32A Depression, unspecified: Secondary | ICD-10-CM | POA: Diagnosis not present

## 2022-08-28 DIAGNOSIS — I10 Essential (primary) hypertension: Secondary | ICD-10-CM | POA: Diagnosis not present

## 2022-08-28 DIAGNOSIS — K219 Gastro-esophageal reflux disease without esophagitis: Secondary | ICD-10-CM | POA: Diagnosis not present

## 2022-08-28 DIAGNOSIS — I69354 Hemiplegia and hemiparesis following cerebral infarction affecting left non-dominant side: Secondary | ICD-10-CM | POA: Diagnosis not present

## 2022-08-28 DIAGNOSIS — E119 Type 2 diabetes mellitus without complications: Secondary | ICD-10-CM | POA: Diagnosis not present

## 2022-08-28 DIAGNOSIS — E039 Hypothyroidism, unspecified: Secondary | ICD-10-CM | POA: Diagnosis not present

## 2022-08-28 DIAGNOSIS — E785 Hyperlipidemia, unspecified: Secondary | ICD-10-CM | POA: Diagnosis not present

## 2022-08-28 DIAGNOSIS — F319 Bipolar disorder, unspecified: Secondary | ICD-10-CM | POA: Diagnosis not present

## 2022-08-28 DIAGNOSIS — F419 Anxiety disorder, unspecified: Secondary | ICD-10-CM | POA: Diagnosis not present

## 2022-08-28 DIAGNOSIS — J449 Chronic obstructive pulmonary disease, unspecified: Secondary | ICD-10-CM | POA: Diagnosis not present

## 2022-08-28 NOTE — Telephone Encounter (Signed)
Patient called said she is at Rehab in [place] patient said she has aids at home, thought physical therapy nurses come out to her house?  Patient asked for a call back [phone]

## 2022-08-29 DIAGNOSIS — F419 Anxiety disorder, unspecified: Secondary | ICD-10-CM | POA: Diagnosis not present

## 2022-08-29 DIAGNOSIS — F32A Depression, unspecified: Secondary | ICD-10-CM | POA: Diagnosis not present

## 2022-08-29 DIAGNOSIS — K219 Gastro-esophageal reflux disease without esophagitis: Secondary | ICD-10-CM | POA: Diagnosis not present

## 2022-08-29 DIAGNOSIS — I10 Essential (primary) hypertension: Secondary | ICD-10-CM | POA: Diagnosis not present

## 2022-08-29 DIAGNOSIS — I69354 Hemiplegia and hemiparesis following cerebral infarction affecting left non-dominant side: Secondary | ICD-10-CM | POA: Diagnosis not present

## 2022-08-29 DIAGNOSIS — E119 Type 2 diabetes mellitus without complications: Secondary | ICD-10-CM | POA: Diagnosis not present

## 2022-08-29 DIAGNOSIS — E039 Hypothyroidism, unspecified: Secondary | ICD-10-CM | POA: Diagnosis not present

## 2022-08-29 DIAGNOSIS — J449 Chronic obstructive pulmonary disease, unspecified: Secondary | ICD-10-CM | POA: Diagnosis not present

## 2022-08-29 DIAGNOSIS — E785 Hyperlipidemia, unspecified: Secondary | ICD-10-CM | POA: Diagnosis not present

## 2022-08-29 DIAGNOSIS — F319 Bipolar disorder, unspecified: Secondary | ICD-10-CM | POA: Diagnosis not present

## 2022-08-29 NOTE — Telephone Encounter (Signed)
Spoke to patient, .  Patient is constipated since her surgery, taking medicine but not helping, not able to eat hurts her stomach when she eats.

## 2022-08-29 NOTE — Telephone Encounter (Signed)
Patient aware.

## 2022-08-29 NOTE — Telephone Encounter (Signed)
Called patient no voicemail setup/try later.

## 2022-08-30 DIAGNOSIS — F419 Anxiety disorder, unspecified: Secondary | ICD-10-CM | POA: Diagnosis not present

## 2022-08-30 DIAGNOSIS — F319 Bipolar disorder, unspecified: Secondary | ICD-10-CM | POA: Diagnosis not present

## 2022-08-30 DIAGNOSIS — F339 Major depressive disorder, recurrent, unspecified: Secondary | ICD-10-CM | POA: Diagnosis not present

## 2022-08-31 DIAGNOSIS — Z4789 Encounter for other orthopedic aftercare: Secondary | ICD-10-CM | POA: Diagnosis not present

## 2022-09-01 ENCOUNTER — Telehealth: Payer: Self-pay

## 2022-09-01 NOTE — Progress Notes (Signed)
   Care Guide Note  09/01/2022 Name: [name] MRN: [id number] DOB: [birth year]  Referred by: Kerri Perches, MD Reason for referral : Care Coordination (Outreach to schedule referral with Pharm d )   [name] is a 72 y.o. year old female who is a primary care patient of Lodema Hong Milus Mallick, MD. [name] was referred to the pharmacist for assistance related to DM.    An unsuccessful telephone outreach was attempted today to contact the patient who was referred to the pharmacy team for assistance with medication management. Additional attempts will be made to contact the patient.   Penne Lash, RMA Care Guide [facility]  [place], [place] [address] Direct Dial: [phone] [email or web address]

## 2022-09-04 ENCOUNTER — Other Ambulatory Visit: Payer: Self-pay | Admitting: *Deleted

## 2022-09-04 DIAGNOSIS — D509 Iron deficiency anemia, unspecified: Secondary | ICD-10-CM

## 2022-09-04 DIAGNOSIS — D638 Anemia in other chronic diseases classified elsewhere: Secondary | ICD-10-CM

## 2022-09-04 DIAGNOSIS — D631 Anemia in chronic kidney disease: Secondary | ICD-10-CM

## 2022-09-04 DIAGNOSIS — N1832 Chronic kidney disease, stage 3b: Secondary | ICD-10-CM

## 2022-09-05 DIAGNOSIS — R5381 Other malaise: Secondary | ICD-10-CM | POA: Diagnosis not present

## 2022-09-05 DIAGNOSIS — E785 Hyperlipidemia, unspecified: Secondary | ICD-10-CM | POA: Diagnosis not present

## 2022-09-05 DIAGNOSIS — M1990 Unspecified osteoarthritis, unspecified site: Secondary | ICD-10-CM | POA: Diagnosis not present

## 2022-09-06 DIAGNOSIS — R4182 Altered mental status, unspecified: Secondary | ICD-10-CM | POA: Diagnosis not present

## 2022-09-06 DIAGNOSIS — F339 Major depressive disorder, recurrent, unspecified: Secondary | ICD-10-CM | POA: Diagnosis not present

## 2022-09-06 DIAGNOSIS — R5381 Other malaise: Secondary | ICD-10-CM | POA: Diagnosis not present

## 2022-09-06 DIAGNOSIS — D649 Anemia, unspecified: Secondary | ICD-10-CM | POA: Diagnosis not present

## 2022-09-06 DIAGNOSIS — F419 Anxiety disorder, unspecified: Secondary | ICD-10-CM | POA: Diagnosis not present

## 2022-09-06 DIAGNOSIS — F319 Bipolar disorder, unspecified: Secondary | ICD-10-CM | POA: Diagnosis not present

## 2022-09-07 ENCOUNTER — Ambulatory Visit: Payer: Medicare HMO | Admitting: Internal Medicine

## 2022-09-07 DIAGNOSIS — F32A Depression, unspecified: Secondary | ICD-10-CM | POA: Diagnosis not present

## 2022-09-07 DIAGNOSIS — K219 Gastro-esophageal reflux disease without esophagitis: Secondary | ICD-10-CM | POA: Diagnosis not present

## 2022-09-07 DIAGNOSIS — I10 Essential (primary) hypertension: Secondary | ICD-10-CM | POA: Diagnosis not present

## 2022-09-07 DIAGNOSIS — Z4789 Encounter for other orthopedic aftercare: Secondary | ICD-10-CM | POA: Diagnosis not present

## 2022-09-07 DIAGNOSIS — E119 Type 2 diabetes mellitus without complications: Secondary | ICD-10-CM | POA: Diagnosis not present

## 2022-09-07 DIAGNOSIS — F319 Bipolar disorder, unspecified: Secondary | ICD-10-CM | POA: Diagnosis not present

## 2022-09-07 DIAGNOSIS — F419 Anxiety disorder, unspecified: Secondary | ICD-10-CM | POA: Diagnosis not present

## 2022-09-07 DIAGNOSIS — J449 Chronic obstructive pulmonary disease, unspecified: Secondary | ICD-10-CM | POA: Diagnosis not present

## 2022-09-07 DIAGNOSIS — E039 Hypothyroidism, unspecified: Secondary | ICD-10-CM | POA: Diagnosis not present

## 2022-09-07 DIAGNOSIS — E785 Hyperlipidemia, unspecified: Secondary | ICD-10-CM | POA: Diagnosis not present

## 2022-09-07 NOTE — Progress Notes (Signed)
   Care Guide Note  09/07/2022 Name: LANIE SCHELLING MRN: [id number] DOB: 10/18/1950  Referred by: Kerri Perches, MD Reason for referral : Care Coordination (Outreach to schedule referral with Pharm d )   Zorah Backes Mroczkowski is a 72 y.o. year old female who is a primary care patient of Lodema Hong Milus Mallick, MD. Weyman Pedro Russomanno was referred to the pharmacist for assistance related to DM.    A second unsuccessful telephone outreach was attempted today to contact the patient who was referred to the pharmacy team for assistance with medication management. Additional attempts will be made to contact the patient.  Penne Lash, RMA Care Guide [facility]  [place], [place] [address] Direct Dial: [phone] [email or web address]

## 2022-09-07 NOTE — Progress Notes (Deleted)
Patient ID: AZA DANTES, female   DOB: 09-15-1950, 72 y.o.   MRN: [id number]  HPI: [name] is a 72 y.o.-year-old female, initially referred by her PCP, Dr. Lodema [name], presenting for follow-up for DM2, dx 1995, insulin-dependent since 1997, uncontrolled, with complications (gastroparesis, cerebrovasc. Ds-  H/o stroke, PN),  Hypothyroidism, thyroid nodules.  Last visit 4 months ago.  Interim history: Before last visit she was getting steroid injections in her shoulder.  She had first total shoulder arthroplasty 08/18/2022.  She also continues to have neck pain, headaches, disequilibrium.  She finished physical therapy. She has blurry vision but no chest pain, palpitations, or increased urination.  DM2: Reviewed HbA1c levels: Lab Results  Component Value Date   HGBA1C 5.2 08/08/2022   HGBA1C 5.5 04/20/2022   HGBA1C 5.7 (H) 12/23/2021  10/28/2013: HbA1c 8.5%  She is on: - Toujeo 20 units daily (25 units during steroid treatment) - Novolog  14 units for smaller meals (17 units during steroid treatment) 17 units for large meals  (20 units during steroid treatment) Could not tolerate Metformin >> nausea.   Insulin doses were decreased in 04/2017 due to low blood sugars in the 40s. Nephrology started [place] but stopped "it did not agree with me".  Pt checks her sugars 4 times a day: - am:  120-174, 250, 300 with steroids >> 140-170, 200s >> 103-159 - 2h after b'fast: n/c >> 110 >> n/c - before lunch: 103-171, 200-385 (high with steroids) >> 89-192 >> 140-155 >> 134 -150s - 2h after lunch:  n/c >> 127, 215, 241 >> n/c - before dinner:  91-176 (266-401) (high with steroids) >> 120-130 >> 140-155 >> 127-140 - 2h after dinner: n/c - bedtime: 60, 104-173 >> 83-190, 194-414 >> 99-100s >> 200s, 325 - steroids >> n/c - nighttime: n/c >> 134 >> n/c Lowest sugar was 64 >> 40s >> 89 >> 30s (slept and delayed meals, not eating well) >> 103; she has hypoglycemia awareness in the 70s. Highest  sugar was 542 (steroid inj) ... >> 414 (steroid) >> 300s >> 300s (steroids) >> 300 occasionally.  Pt's meals are: - Breakfast: eggs, cereals, fruit, oatmeal, bacon - Lunch: sandwich, beef, mashed potatoes,diet soda - Dinner: chicken, beef, fish, potatoes, vegetables - Snacks: 1-2: fruit  -+ Mild CKD; last BUN/creatinine:  Lab Results  Component Value Date   BUN 38 (H) 08/24/2022   CREATININE 1.05 (H) 08/24/2022  On losartan.  -+ HL; last set of lipids: Lab Results  Component Value Date   CHOL 123 06/29/2022   HDL 54 06/29/2022   LDLCALC 53 06/29/2022   TRIG 81 06/29/2022   CHOLHDL 2.3 06/29/2022  On Crestor and Zetia.  - last eye exam was in 05/2022: + DR; Had cataract sx B. Dr. Daphine [name] [name]). + L eye pain.  -She has numbness and tingling mainly in the left leg-affected by stroke. Sees podiatry.  She had foot exam by podiatry (Dr. Eloy [name]) 07/04/2022.  Hypothyroidism: -Diagnosed many years ago  She takes levothyroxine 50 mcg daily: - in am - fasting - at least 30 min from b'fast - no calcium - no iron - + multivitamins with lunch  - + Aciphex -moved later in the day - no PPIs - +on Biotin and B complex  Reviewed her TFTs: Lab Results  Component Value Date   TSH 1.060 06/29/2022   TSH 0.494 10/04/2021   TSH 0.710 09/16/2021   TSH 0.85 05/19/2021   TSH 0.631 12/11/2019   Thyroid nodules.  Pt denies: - feeling nodules in neck - hoarseness But she continues to have chronic dysphagia/choking with certain foods.  She had previous esophageal dilations.   I reviewed previous imaging and biopsy tests: FNA (2014) x2: Benign  thyroid U/S (10/2013): Right thyroid lobe: 3.7 x 1.0 x 1.8 cm  Left thyroid lobe: 3.5 x 1.5 x 1.6 cm  Isthmus: 0.5 cm  Focal nodules:  There is a 0.3 mm calcified nodule in the right midzone.  There is a 0.6 x 0.7 x 0.5 cm hypoechoic nodule in the  lateral aspect of the right midzone.  There is a 0.7 x 0.6 x 0.5 cm hyperechoic nodule in  the lateral aspect of the right lower pole.  There is a 2.2 x 1.1 x 1.7 cm complex solid nodule in the inferior aspect of the isthmus extending adjacent to the left lower pole.  There is a 1.4 x 2.1 x 1.4 cm complex solid nodule in the left lower pole.  Lymphadenopathy: None visualized.  Repeat U/S (01/2014): Stable appearance of the nodules  Repeat U/S (10/2015): stable appearance of the nodules  Repeat U/S (10/2016): Stable appearance of the nodules  Repeat U/S (02/02/2021): Stable appearance of nodules  She had a L rib fracture in 12/2014. She had a R ankle fracture 02/20/2020 >> bone stimulator to help with healing. Patient was admitted with AMS + sepsis on 10/15/2016.  She had aspiration pneumonia and acute respiratory failure She has a recurrent meningioma >> seeing Dr. Jule [name]. She was admitted 09/15/2021 after a fall, and found to have dehydration, AKI and hyperkalemia (6.2) -considered the reason for her weakness and subsequent fall.   She is on Fosamax. She has a h/o pancreatitis.   ROS: + see HPI  I reviewed pt's medications, allergies, PMH, social hx, family hx, and changes were documented in the history of present illness. Otherwise, unchanged from my initial visit note.  Past Medical History:  Diagnosis Date   Allergy    Anemia    Anemia in chronic kidney disease (CKD) 08/10/2021   Anemia in chronic kidney disease (CKD) 10/12/2021   Anxiety    takes Ativan daily   Arthritis    Assistance needed for mobility    Bipolar disorder (HCC)    takes Risperdal nightly   Blood transfusion    Brain tumor (HCC)    Cancer (HCC)    In her gum   Carpal tunnel syndrome of right wrist 05/23/2011   Cervical disc disorder with radiculopathy of cervical region 10/31/2012   Chronic back pain    Chronic idiopathic constipation    Chronic neck and back pain    Colon polyps    COPD (chronic obstructive pulmonary disease) with chronic bronchitis 09/16/2013   Office Spirometry  10/30/2013-submaximal effort based on appearance of loop and curve. Numbers would fit with severe restriction but her physiologic capability may be better than this. FVC 0.91/44%, and 10.74/45%, FEV1/FVC 0.81, FEF 25-75% 1.43/69%     Diabetes mellitus    Type II   Diverticulosis    TCS 9/08 by Dr. Lina Sar for diarrhea . Bx for micro scopic colitis negative.    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   Metabolic encephalopathy 08/03/2011   Migraines    chronic headaches   Mononeuritis lower limb    Narcolepsy    Osteoporosis    Pancreatitis 2006   due to Depakote with normal EUS    Paralysis (HCC)    Pneumonia    Schatzki's ring    non critical / EGD with ED 8/2011with RMR   Seizures (HCC)    takes Lamictal daily.Last seizure 3 yrs ago   Sleep apnea    on CPAP   Small bowel obstruction (HCC)    Stroke (HCC)    left sided weakness, speech changes   Tubular adenoma of colon    Past Surgical History:  Procedure Laterality Date   ABDOMINAL HYSTERECTOMY  1978   BACK SURGERY  July 2012   BACTERIAL OVERGROWTH TEST N/A 05/05/2013   Procedure: BACTERIAL OVERGROWTH TEST;  Surgeon: Corbin Ade, MD;  Location: [facility];  Service: Endoscopy;  Laterality: N/A;  7:30   BIOPSY THYROID  2009   BRAIN SURGERY  11/2011   resection of meningioma   BREAST REDUCTION SURGERY  1994   CARDIAC CATHETERIZATION  05/10/2005   normal coronaries, normal LV systolic function and EF (Dr. Evlyn Courier)   CARPAL TUNNEL  RELEASE Left 07/22/04   Dr. Romeo Apple   CATARACT EXTRACTION Bilateral    CHOLECYSTECTOMY  1984   COLONOSCOPY N/A 09/25/2012   WUJ:[id number] diverticulosis.  colonic polyp-removed : tubular adenoma   CRANIOTOMY  11/23/2011   Procedure: CRANIOTOMY TUMOR EXCISION;  Surgeon: Hewitt Shorts, MD;  Location: [facility];  Service: Neurosurgery;  Laterality: N/A;  Craniotomy for tumor resection   ESOPHAGOGASTRODUODENOSCOPY  12/29/2010   R[place]k-Retained food in the esophagus and stomach, small hiatal hernia, status post Maloney dilation of the esophagus   ESOPHAGOGASTRODUODENOSCOPY N/A 09/25/2012   [id number]:[id number] atonic baggy esophagus status post Maloney dilation 56 F. Hiatal hernia   GIVENS CAPSULE STUDY N/A 01/15/2013   NORMAL.    IR GENERIC HISTORICAL  03/17/2016   IR RADIOLOGIST EVAL & MGMT 03/17/2016 [facility]   LESION REMOVAL N/A 05/31/2015   Procedure: REMOVAL RIGHT AND LEFT LESIONS OF MANDIBLE;  Surgeon: Ocie Doyne, DDS;  Location: [facility];  Service: Oral Surgery;  Laterality: N/A;   MALONEY DILATION  12/29/2010   RMR;   NM MYOCAR PERF WALL MOTION  2006   "relavtiely normal" persantine, mild anterior thinning (breast attenuation artifact), no region of scar/ischemia   OVARIAN CYST REMOVAL     RECTOCELE REPAIR N/A 06/29/2015   Procedure: POSTERIOR REPAIR (RECTOCELE);  Surgeon: Tilda Burrow, MD;  Location: [facility];  Service: Gynecology;  Laterality: N/A;   REDUCTION MAMMAPLASTY Bilateral    REVERSE SHOULDER ARTHROPLASTY Left 08/18/2022   Procedure: REVERSE SHOULDER ARTHROPLASTY;  Surgeon: Beverely Low, MD;  Location: [facility];  Service: Orthopedics;  Laterality: Left;  choice with interscalene, general   SPINE SURGERY  09/29/2010   Dr. Shon Baton   surgical excision of 3 tumors from right thigh and right buttock  and left upper thigh  2010   TOOTH EXTRACTION Bilateral 12/14/2014   Procedure: REMOVAL OF BILATERAL MANDIBULAR EXOSTOSES;  Surgeon: Ocie Doyne, DDS;  Location: [facility];  Service: Oral  Surgery;  Laterality: Bilateral;   TRANSTHORACIC ECHOCARDIOGRAM  2010   EF 60-65%, mild conc LVH, grade 1 diastolic dysfunction; mildly calcified MV annulus with mildly thickened leaflets, mildly calcified MR annulus   History   Social History   Marital Status: Divorced    Spouse Name: N/A    Number of Children: 1   Years of Education: 12   Occupational History   disabled     Social History Main Topics   Smoking status: Current Every Day Smoker -- 0.25 packs/day for 7 years    Types: Cigarettes   Smokeless tobacco: Never Used     Comment: "started back but off now for 3 months" (08/18/13)   Alcohol Use: No     Comment:     Drug Use: No   Current Outpatient Medications on File Prior to Visit  Medication Sig Dispense Refill   ACCU-CHEK GUIDE test strip USE TO CHECK BLOOD SUGAR FOUR TIMES A DAY AND AS NEEDED (NEED MD APPOINTMENT FOR ANY FURTHER REFILLS) 450 strip 3   Accu-Chek Softclix Lancets lancets TEST BLOOD SUGAR THREE TIMES DAILY AS DIRECTED 300 each 2   acetaminophen (TYLENOL) 325 MG tablet Take 650 mg by mouth every 6 (six) hours as needed for mild pain, moderate pain or headache.     albuterol (PROVENTIL) (2.5 MG/3ML) 0.083% nebulizer solution Take 3 mLs (2.5 mg total) by nebulization every 6 (six) hours as needed for wheezing or shortness of breath. 75 mL 12   albuterol (VENTOLIN HFA) 108 (90 Base) MCG/ACT inhaler INHALE 1 TO 2 PUFFS EVERY 6 HOURS AS NEEDED FOR WHEEZING, SHORTNESS OF BREATH (Patient taking differently: Inhale 1-2 puffs into the lungs every 6 (six) hours as needed for wheezing or shortness of breath.) 3 each 3   Alcohol Swabs (DROPSAFE ALCOHOL PREP) 70 % PADS USE TO CLEAN FINGER PRIOR TO TESTING FOR BLOOD SUGAR AS DIRECTED 300 each 3   alendronate (FOSAMAX) 70 MG tablet TAKE 1 TABLET EVERY 7 DAYS ON AN EMPTY STOMACH WITH A FULL GLASS OF WATER (Patient taking differently: Take 70 mg by mouth once a week.) 12 tablet 3   amLODipine (NORVASC) 10 MG tablet Take 1  tablet (10 mg total) by mouth daily. 30 tablet 0   ascorbic acid (VITAMIN C) 500 MG tablet Take 500 mg by mouth daily.     aspirin EC 81 MG tablet Take 1 tablet (81 mg total) by mouth daily with breakfast. 120 tablet 2   benzonatate (TESSALON) 200 MG capsule Take 1 capsule (200 mg total) by mouth 3 (three) times daily as needed for cough. 40 capsule 0   Blood Glucose Calibration (ACCU-CHEK GUIDE CONTROL) LIQD USE AS DIRECTED 1 each 0   blood glucose meter kit and supplies Dispense based on patient and insurance preference. Use up to four times daily as directed. (FOR ICD-10 E10.9, E11.9). 1 each 0   buPROPion (WELLBUTRIN XL) 150 MG 24 hr tablet Take 1 tablet (150 mg total) by mouth every morning. 90 tablet 2   Calcium Carb-Cholecalciferol (CALTRATE 600+D3 SOFT) 600-20 MG-MCG CHEW Chew 1 tablet by mouth daily.     cetirizine (ZYRTEC) 10 MG tablet Take 1 tablet (10 mg total) by mouth daily. 30 tablet 1   Cholecalciferol (D3 PO) Take 1 tablet by mouth daily.     clobetasol (TEMOVATE) 0.05 % external solution Apply 1 Application topically 2 (two) times daily. (Patient taking differently: Apply 1 Application topically See admin instructions. Apply to affected area(s) 2 times a day) 50  mL 1   Continuous Blood Gluc Sensor (FREESTYLE LIBRE 14 DAY SENSOR) MISC 1 each by Does not apply route every 14 (fourteen) days. Change every 2 weeks (Patient taking differently: Inject 1 Device into the skin every 14 (fourteen) days.) 2 each 11   Docusate Sodium (DSS) 100 MG CAPS Take 100 mg by mouth in the morning and at bedtime.     DROPLET PEN NEEDLES 31G X 8 MM MISC USE FOR INJECTING INSULIN 4 TIMES DAILY. 400 each 0   Elastic Bandages & Supports (ADJUSTABLE ARM SLING) MISC L arm sling 1 each 0   ezetimibe (ZETIA) 10 MG tablet TAKE 1 TABLET EVERY DAY (Patient taking differently: Take 10 mg by mouth daily.) 90 tablet 10   insulin aspart (NOVOLOG FLEXPEN) 100 UNIT/ML FlexPen Inject 5 Units into the skin 3 (three) times  daily with meals. Give if eats 50% or more of meal. (Patient taking differently: Inject 5 Units into the skin 3 (three) times daily with meals. Inject 5 units into the skin three times a day with meals and use only if 50% or more of a meal is eaten) 30 mL 2   ipratropium (ATROVENT) 0.03 % nasal spray Place 2 sprays into both nostrils every 12 (twelve) hours. 30 mL 12   lactobacillus acidophilus (BACID) TABS tablet Take 2 tablets by mouth 3 (three) times daily.     lamoTRIgine (LAMICTAL) 100 MG tablet Take 1 tablet (100 mg total) by mouth 2 (two) times daily. 180 tablet 2   levothyroxine (SYNTHROID) 50 MCG tablet TAKE 1 TABLET DAILY SIX DAYS A WEEK AND 1/2 TABLET ON ONE DAY A WEEK (Patient taking differently: Take 25-50 mcg by mouth See admin instructions. Take 25 mcg by mouth before breakfast on Sunday and 50 mcg on Mon/Tues/Wed/Thurs/Fri/Sat) 85 tablet 2   methocarbamol (ROBAXIN-750) 750 MG tablet Take 1 tablet (750 mg total) by mouth every 8 (eight) hours as needed for muscle spasms. 21 tablet 0   metoprolol tartrate (LOPRESSOR) 50 MG tablet TAKE 1 TABLET TWICE DAILY (NEED MD APPOINTMENT) (Patient taking differently: Take 50 mg by mouth 2 (two) times daily.) 180 tablet 3   Misc. Devices (MATTRESS PAD) MISC FIRM MATTRESS PAD for hospital bed x 1 1 each 0   montelukast (SINGULAIR) 10 MG tablet TAKE 1 TABLET EVERY DAY (Patient taking differently: Take 10 mg by mouth daily.) 90 tablet 3   Multiple Vitamins-Minerals (MULTIVITAMIN GUMMIES ADULT PO) Take 1 tablet by mouth daily.     MYRBETRIQ 25 MG TB24 tablet TAKE 1 TABLET EVERY DAY 90 tablet 3   nicotine (NICODERM CQ) 21 mg/24hr patch Place 1 patch (21 mg total) onto the skin daily. 28 patch 0   nystatin (MYCOSTATIN/NYSTOP) powder Apply 1 Application topically 2 (two) times daily.     Omega-3 Fatty Acids (FISH OIL PO) Take 1 capsule by mouth daily.     oxyCODONE (OXY IR/ROXICODONE) 5 MG immediate release tablet Take 1 tablet (5 mg total) by mouth every 4  (four) hours as needed for severe pain or breakthrough pain (pain score 4-6). 30 tablet 0   potassium chloride (KLOR-CON) 10 MEQ tablet Take 1 tablet (10 mEq total) by mouth 2 (two) times daily. 30 tablet 2   RABEprazole (ACIPHEX) 20 MG tablet TAKE 1 TABLET TWICE DAILY 180 tablet 2   RESTASIS 0.05 % ophthalmic emulsion Place 2 drops into both eyes daily.     rosuvastatin (CRESTOR) 5 MG tablet TAKE 1 TABLET AT BEDTIME 90 tablet 3  sertraline (ZOLOFT) 100 MG tablet Take 1 tablet (100 mg total) by mouth every morning. 90 tablet 2   spironolactone (ALDACTONE) 50 MG tablet TAKE 1 TABLET (50 MG TOTAL) BY MOUTH DAILY. DISCONTINUE SPIRONOLACTONE 25MG  (Patient taking differently: Take 50 mg by mouth daily.) 90 tablet 3   telmisartan (MICARDIS) 20 MG tablet Take 10 mg by mouth daily.     TOUJEO MAX SOLOSTAR 300 UNIT/ML Solostar Pen INJECT 20-26 UNITS INTO THE SKIN DAILY. (Patient taking differently: Inject 20-26 Units into the skin at bedtime.) 12 mL 3   umeclidinium-vilanterol (ANORO ELLIPTA) 62.5-25 MCG/ACT AEPB Inhale 1 puff into the lungs daily. 60 each 5   UNABLE TO FIND Med Name:  G5 Mattress 1 each 0   vitamin E 180 MG (400 UNITS) capsule Take 400 Units by mouth daily.     No current facility-administered medications on file prior to visit.      Allergies  Allergen Reactions   Cephalexin Hives   Iron Nausea And Vomiting   Milk-Related Compounds Other (See Comments)    Doesn't agree with stomach.    Penicillins Hives        Phenazopyridine Hcl Hives         Family History  Problem Relation Age of Onset   Heart attack Mother        HTN   Pneumonia Father    Kidney failure Father    Diabetes Father    Pancreatic cancer Sister    Cancer Sister        breast    Cancer Sister        pancreatic   Diabetes Brother    Hypertension Brother    Diabetes Brother    Alcohol abuse Maternal Uncle    Stroke Maternal Grandmother    Heart attack Maternal Grandfather    Hypertension Son     Sleep apnea Son    Colon cancer Neg Hx    Anesthesia problems Neg Hx    Hypotension Neg Hx    Malignant hyperthermia Neg Hx    Pseudochol deficiency Neg Hx    Breast cancer Neg Hx    Stomach cancer Neg Hx    PE: There were no vitals taken for this visit.   Wt Readings from Last 3 Encounters:  08/18/22 173 lb 11.6 oz (78.8 kg)  08/09/22 167 lb (75.8 kg)  08/08/22 167 lb (75.8 kg)   Constitutional: overweight, in NAD, walks with a walker.  Difficulty rising from a sitting position without help. Eyes: EOMI, no exophthalmos ENT: no thyromegaly, no cervical lymphadenopathy Cardiovascular: RRR, No MRG Respiratory: CTA B Musculoskeletal: no deformities Skin: no rashes Neurological: no tremor with outstretched hands  ASSESSMENT: 1. DM2, insulin-dependent, uncontrolled, without complications - gastroparesis - cerebrovasc. Ds -  H/o stroke - PN  She was interested in an insulin pump >> discussed about [employer] (given brochure).  2. Hypothyroidism  3. MNG - Thyroid U/S (10/11/2012): Right thyroid lobe:  3.7 x 1.0 x 1.8 cm Left thyroid lobe:   3.5 x 1.5 x 1.6 cm Isthmus:  0.5 cm Focal nodules:  There is a 0.3 mm calcified nodule in the right midzone.  There is a 0.6 x 0.7 x 0.5 cm hypoechoic nodule in the lateral aspect of the right midzone.  There is a 0.7 x 0.6 x 0.5 cm hyperechoic nodule in the lateral aspect of the right lower pole. There is a 2.2 x 1.1 x 1.7 cm complex solid nodule in the inferior aspect of the  isthmus extending adjacent to the left lower pole. There is a 1.4 x 2.1 x 1.4 cm complex solid nodule in the left lower pole.  Lymphadenopathy:  None visualized.   IMPRESSION: Multi nodular goiter which has markedly progressed since the prior exam.  The dominant nodule at the inferior aspect of the isthmus fits criteria for fine needle aspiration biopsy if not previously Assessed.  - FNA (11/05/2012) x2: benign  - Thyroid U/S (01/20/2014) - felt one nodule enlarging  >> new U/S: stable appearance of the nodules, except a small new nodule in isthmus: 1.2 cm  Right thyroid lobe Measurements: 4.4 cm x 1.2 cm x 1.7 cm. Multiple right-sided nodules identified. Each right-sided nodule demonstrates increased echogenicity, with the superior measuring 7 mm -8 mm, most inferior measuring 9 mm - 10 mm. A small, 3 mm focus of calcium with posterior shadowing is evident. There is also a mid nodule measuring 7 mm, which is echogenic. Left thyroid lobe Measurements: 5.1 cm x 2.1 cm x 2.3 cm. Dominant lesion at the inferior pole of left thyroid is again evident, which has been previously biopsied (10/29/2012). This nodule measures 1.8 cm x 1.7cm x 2.5 cm. (Previous 1.4 cm x 2.1 cm x 1.4 cm) Isthmus Thickness: 4 mm-5 mm. Isthmic nodule again noted, previously biopsied (10/29/2012). Currently this nodule measures 2.2 cm x 1.4 cm x 1.8 cm. (previous measurement 2.2 cm x 1.1 cm x 1.7 cm). There is a new nodule identified within the isthmus with heterogeneously hyperechoic characteristics. This nodule measures 12 mm x 5.4 mm x 7.2 mm. Lymphadenopathy: None visualized.  - Thyroid U/S (11/09/2015): Parenchymal Echotexture: Moderately heterogenous Estimated total number of nodules > 1 cm: <5 Number of spongiform nodules > 2 cm not described below (TR1): 0 Number of mixed cystic nodules > 1.5 cm not described below (TR2): 0 _____________________________________________________  Isthmus: 0.7 cm  Nodule # 1: Prior biopsy: No Location: Isthmus; left Size: 1.5 x 1.3 x 1.5 cm, previously 2.2 x 1.4 x 1.8 cm Composition: solid/almost completely solid (2) Echogenicity: hyperechoic (1) ACR TI-RADS total points: 3. Change in features: Yes. Increased internal cystic degeneration and smaller in size. Change in ACR TI-RADS risk category: No  *Given size (1.5 - 2.4 cm) and appearance, a follow-up ultrasound in 1 year is recommended based on TI-RADS criteria as  clinically indicated.  _________________________________________________________  Right lobe: 4.6 x 1.1 x 1.9 cm, previously 4.4 x 1.2 x 1.7 cm Stable scattered subcentimeter echogenic nodules _________________________________________________________  Left lobe: 4.8 x 2.1 x 1.9 cm, previously 5.1 x 2.1 x 2.3 cm Nodule # 1: Prior biopsy: No Location: Left; Inferior Size: 2.4 x 1.7 x 1.9 cm, previously 2.5 x 1.7 x 1.7 Composition: solid/almost completely solid (2) Echogenicity: hyperechoic (1) ACR TI-RADS total points: 3. ACR TI-RADS risk category: TR3 (3 points). Change in features: No Change in ACR TI-RADS risk category: No *Given size (1.5 - 2.4 cm) and appearance, a follow-up ultrasound in 1 year is recommended based on TI-RADS criteria as clinically indicated.  IMPRESSION: TR 3 isthmic and left thyroid nodules unchanged. *Given size (1.5 - 2.4 cm) and appearance of both thyroid nodules, a follow-up ultrasound in 1 year is recommended based on TI-RADS criteria as clinically indicated.  10/12/2016: Thyroid U/S: Parenchymal Echotexture: Moderately heterogenous Isthmus: 1.1 cm Right lobe: 3.8 x 1.0 x 1.7 cm Left lobe: 4.5 x 1.7 x 2.1 cm  ______________________________________________________   Estimated total number of nodules >/= 1 cm: 3  _____________________________________________   Diffusely  heterogeneous multinodular thyroid gland. As seen previously, there are multiple small echogenic nodules scattered throughout the right gland which do not meet criteria for biopsy or follow-up. Head and lobular pyramidal lobe extends superiorly from the thyroid isthmus. No interval change.   The previously biopsied nodule in the inferior aspect of the isthmus measures slightly smaller today at 1.5 x 1.0 x 1.4 cm compared to 1.7 x 1.3 x 1.5 cm previously.   The previously biopsied nodule in the left inferior gland is essentially unchanged at 2.6 x 1.3 x 1.5 cm compared to 2.4  x 1.8 x 1.9 cm. No new nodule or abnormality identified.   IMPRESSION: 1. Stable multinodular goiter with a prominent lobular parental lobe extending superiorly from the thyroid isthmus. 2. The previously biopsied nodule in the inferior isthmus measures slightly smaller on today's examination. 3. The previously biopsied nodule in the left inferior gland is stable. 4. Additional scattered small and benign-appearing nodules do not meet criteria for biopsy or continued follow-up.  Thyroid U/S (02/02/2021): Parenchymal Echotexture: Mildly heterogeneous Isthmus: 0.6 cm Right lobe: 4.4 x 0.8 x 1.7 cm  Left lobe: 4.5 x 2.1 x 3.6 cm  ______________________________________________________   Nodule # 1:  Prior biopsy: Yes-10/29/2012  Location: Isthmus; superior  Maximum size: 2.6 cm; Other 2 dimensions: 1.0 x 2.0 cm, previously, 1.5 x 1.0 x 1.4 cm  Composition: mixed cystic and solid (1)  Echogenicity: isoechoic (1) Significant change in size (>/= 20% in two dimensions and minimal increase of 2 mm): Yes (predominately due to increase in cystic component) Change in features: Yes Change in ACR TI-RADS risk category: Yes This nodule does NOT meet TI-RADS criteria for biopsy or dedicated follow-up.  _________________________________________________________   Nodule 2: 1.0 x 0.8 x 0.8 cm hypoechoic (actually hyperechoic) nodule in the inferior right thyroid lobe does (NOT) meet criteria for FNA or imaging surveillance. _________________________________________________________   There is diffuse heterogeneity of the left thyroid lobe without discrete nodules.   IMPRESSION: Previously biopsied isthmus nodule demonstrates interval development of cystic component. It does not meet criteria for repeat biopsy at this time. No new suspicious thyroid nodules.   This did not show appears to have increased in size, but this is actually due to the increased cystic component.  This nodule has  been previously biopsied with benign results. Called and discussed with Dr.Mir with [facility] and he acknowledged that the changes in red above have been dictation errors and he addended the report.  PLAN:  1. Patient with longstanding, uncontrolled, type 2 diabetes, on basal-bolus insulin regimen, with fluctuating CBGs.  Her blood sugars at home do not usually correlate with HbA1c, which appears to be lower.  She was not able to start on the CGM despite seeing the diabetes educator and being shown how it was attached.  She prefers not to use it. -With her history of dehydration and hyperkalemia in the setting of AKI, she is not a good candidate for SGLT2 inhibitor.  This was tried by her nephrologist but further testing prompting discontinuation, reportedly.  At last visit she was inquiring about a GLP-1 receptor agonist but she has a history of pancreatitis and this is not ideal for her. -At last visit sugars were approximately at goal with only occasional hyperglycemic spikes.  She had higher blood sugars after meals after steroid so we discussed about using higher doses of insulin and situation. HbA1c was 5.5% but she had another HbA1c last month and this was even lower, at 5.2%.  -  I advised her to:  Patient Instructions  Please continue: - Toujeo 20 units daily (25 units during steroid treatment) - Novolog  14 units for smaller meals (17 units during steroid treatment) 17 units for large meals  (20 units during steroid treatment)  Please continue levothyroxine 50 mcg daily.   Take the thyroid hormone every day, with water, at least 30 minutes before breakfast, separated by at least 4 hours from: - acid reflux medications - calcium - iron - multivitamins   Please return in 3-4 months.  - advised to check sugars at different times of the day - 4x a day, rotating check times - advised for yearly eye exams >> she is UTD - return to clinic in 3-4 months   2. Hypothyroidism - latest  thyroid labs reviewed with pt. >> normal: Lab Results  Component Value Date   TSH 1.060 06/29/2022  - she continues on LT4 50 mcg daily - pt feels good on this dose. - we discussed about taking the thyroid hormone every day, with water, >30 minutes before breakfast, separated by >4 hours from acid reflux medications, calcium, iron, multivitamins. Pt. is taking it correctly.  Removed Aciphex at least 4 hours after levothyroxine at previous visits.  3. MNG -She had a benign thyroid nodule biopsied in 2014 -The nodules remained stable per review of thyroid ultrasound report from 2014 to 2022 -He still has chronic mild dysphagia with no esophageal compression from the thyroid, per swallowing study from 08/2014. She has a history of multiple esophageal dilations. -She had another thyroid ultrasound in 01/2021 and one of the nodule were larger, but this was due to fluid accumulation.  I did not suggest further intervention.  [name] Pavlov, MD PhD [name] Endocrinology

## 2022-09-08 ENCOUNTER — Telehealth: Payer: Self-pay | Admitting: Family Medicine

## 2022-09-08 NOTE — Telephone Encounter (Signed)
Shanna w. [facility] called  Pt is supposed to be discharged on Sunday 6/30   TOC appt made   [facility] call needed   [facility] rehab will call office to reschedule if pt odes not dc on 6/30

## 2022-09-11 DIAGNOSIS — K219 Gastro-esophageal reflux disease without esophagitis: Secondary | ICD-10-CM | POA: Diagnosis not present

## 2022-09-11 DIAGNOSIS — F32A Depression, unspecified: Secondary | ICD-10-CM | POA: Diagnosis not present

## 2022-09-11 DIAGNOSIS — F319 Bipolar disorder, unspecified: Secondary | ICD-10-CM | POA: Diagnosis not present

## 2022-09-11 DIAGNOSIS — J449 Chronic obstructive pulmonary disease, unspecified: Secondary | ICD-10-CM | POA: Diagnosis not present

## 2022-09-11 DIAGNOSIS — D649 Anemia, unspecified: Secondary | ICD-10-CM | POA: Diagnosis not present

## 2022-09-11 DIAGNOSIS — Z4789 Encounter for other orthopedic aftercare: Secondary | ICD-10-CM | POA: Diagnosis not present

## 2022-09-11 DIAGNOSIS — F419 Anxiety disorder, unspecified: Secondary | ICD-10-CM | POA: Diagnosis not present

## 2022-09-11 DIAGNOSIS — I10 Essential (primary) hypertension: Secondary | ICD-10-CM | POA: Diagnosis not present

## 2022-09-11 DIAGNOSIS — E119 Type 2 diabetes mellitus without complications: Secondary | ICD-10-CM | POA: Diagnosis not present

## 2022-09-11 DIAGNOSIS — M1990 Unspecified osteoarthritis, unspecified site: Secondary | ICD-10-CM | POA: Diagnosis not present

## 2022-09-11 NOTE — Progress Notes (Signed)
   Care Guide Note  09/11/2022 Name: [name] MRN: [id number] DOB: Aug 03, 1950  Referred by: Kerri Perches, MD Reason for referral : Care Coordination (Outreach to schedule referral with Pharm d )   [name] Sitter is a 72 y.o. year old female who is a primary care patient of Lodema Hong Milus Mallick, MD. [name] was referred to the pharmacist for assistance related to DM.    A third unsuccessful telephone outreach was attempted today to contact the patient who was referred to the pharmacy team for assistance with medication management. The Population Health team is pleased to engage with this patient at any time in the future upon receipt of referral and should he/she be interested in assistance from the [facility] team.   Penne Lash, RMA Care Guide [facility]  [place], [place] [address] Direct Dial: [phone] [email or web address]

## 2022-09-12 ENCOUNTER — Telehealth: Payer: Self-pay

## 2022-09-12 ENCOUNTER — Inpatient Hospital Stay: Payer: Medicare HMO | Attending: Hematology

## 2022-09-12 NOTE — Telephone Encounter (Signed)
ERROR

## 2022-09-12 NOTE — Telephone Encounter (Signed)
1st toc attempt

## 2022-09-12 NOTE — Transitions of Care (Post Inpatient/ED Visit) (Signed)
   09/12/2022  Name: [name] MRN: [id number] DOB: 1950-04-08  Today's TOC FU Call Status: Today's TOC FU Call Status:: Unsuccessul Call (1st Attempt) Unsuccessful Call (1st Attempt) Date: 09/12/22  Attempted to reach the patient regarding the most recent Inpatient/ED visit.  Follow Up Plan: Additional outreach attempts will be made to reach the patient to complete the Transitions of Care (Post Inpatient/ED visit) call.   Signature Diar Berkel

## 2022-09-13 ENCOUNTER — Telehealth: Payer: Medicare HMO

## 2022-09-13 ENCOUNTER — Telehealth: Payer: Self-pay | Admitting: *Deleted

## 2022-09-13 DIAGNOSIS — F339 Major depressive disorder, recurrent, unspecified: Secondary | ICD-10-CM | POA: Diagnosis not present

## 2022-09-13 DIAGNOSIS — F319 Bipolar disorder, unspecified: Secondary | ICD-10-CM | POA: Diagnosis not present

## 2022-09-13 DIAGNOSIS — F419 Anxiety disorder, unspecified: Secondary | ICD-10-CM | POA: Diagnosis not present

## 2022-09-13 NOTE — Telephone Encounter (Addendum)
     CCM RN Visit Note   @DATE @ Name: [name] MRN: [id number]      DOB: 1950/04/19  Subjective: [name] is a 72 y.o. year old female who is a primary care patient of Syliva Overman MD. The patient was referred to the Chronic Care Management team for assistance with care management needs subsequent to provider initiation of CCM services and plan of care.      Telephone call to [facility] skilled nursing facility, spoke with Ander Slade and verified patient is still admitted at this facility after hospitalization 6/7-6/14/24 for left reverse shoulder arthroplasty.    Plan:Telephone follow up appointment with care management team member scheduled for:  will continue to follow  Irving Shows [facility], BSN RN Case Manager [facility] Primary Care [phone]

## 2022-09-18 ENCOUNTER — Inpatient Hospital Stay: Payer: Medicare HMO | Admitting: Internal Medicine

## 2022-09-18 NOTE — Progress Notes (Deleted)
RESCHEDULED PER PATIENT REQUEST

## 2022-09-19 ENCOUNTER — Inpatient Hospital Stay: Payer: Medicare HMO

## 2022-09-19 ENCOUNTER — Inpatient Hospital Stay: Payer: Medicare HMO | Admitting: Physician Assistant

## 2022-09-20 DIAGNOSIS — R739 Hyperglycemia, unspecified: Secondary | ICD-10-CM | POA: Diagnosis not present

## 2022-09-20 DIAGNOSIS — F319 Bipolar disorder, unspecified: Secondary | ICD-10-CM | POA: Diagnosis not present

## 2022-09-20 DIAGNOSIS — R5381 Other malaise: Secondary | ICD-10-CM | POA: Diagnosis not present

## 2022-09-20 DIAGNOSIS — F419 Anxiety disorder, unspecified: Secondary | ICD-10-CM | POA: Diagnosis not present

## 2022-09-20 DIAGNOSIS — D649 Anemia, unspecified: Secondary | ICD-10-CM | POA: Diagnosis not present

## 2022-09-20 DIAGNOSIS — R4182 Altered mental status, unspecified: Secondary | ICD-10-CM | POA: Diagnosis not present

## 2022-09-20 DIAGNOSIS — F339 Major depressive disorder, recurrent, unspecified: Secondary | ICD-10-CM | POA: Diagnosis not present

## 2022-09-20 DIAGNOSIS — E119 Type 2 diabetes mellitus without complications: Secondary | ICD-10-CM | POA: Diagnosis not present

## 2022-09-21 ENCOUNTER — Telehealth: Payer: Self-pay | Admitting: Family Medicine

## 2022-09-21 DIAGNOSIS — K219 Gastro-esophageal reflux disease without esophagitis: Secondary | ICD-10-CM | POA: Diagnosis not present

## 2022-09-21 DIAGNOSIS — F319 Bipolar disorder, unspecified: Secondary | ICD-10-CM | POA: Diagnosis not present

## 2022-09-21 DIAGNOSIS — E119 Type 2 diabetes mellitus without complications: Secondary | ICD-10-CM | POA: Diagnosis not present

## 2022-09-21 DIAGNOSIS — E785 Hyperlipidemia, unspecified: Secondary | ICD-10-CM | POA: Diagnosis not present

## 2022-09-21 DIAGNOSIS — J449 Chronic obstructive pulmonary disease, unspecified: Secondary | ICD-10-CM | POA: Diagnosis not present

## 2022-09-21 DIAGNOSIS — F419 Anxiety disorder, unspecified: Secondary | ICD-10-CM | POA: Diagnosis not present

## 2022-09-21 DIAGNOSIS — E039 Hypothyroidism, unspecified: Secondary | ICD-10-CM | POA: Diagnosis not present

## 2022-09-21 DIAGNOSIS — Z4789 Encounter for other orthopedic aftercare: Secondary | ICD-10-CM | POA: Diagnosis not present

## 2022-09-21 DIAGNOSIS — I10 Essential (primary) hypertension: Secondary | ICD-10-CM | POA: Diagnosis not present

## 2022-09-21 DIAGNOSIS — F32A Depression, unspecified: Secondary | ICD-10-CM | POA: Diagnosis not present

## 2022-09-21 NOTE — Telephone Encounter (Signed)
TOC call needed   Pt discharging from [facility] today 09/21/22  TOC appt made with provider 09/28/22

## 2022-09-22 ENCOUNTER — Telehealth: Payer: Self-pay | Admitting: Family Medicine

## 2022-09-22 NOTE — Telephone Encounter (Signed)
Verbal order given  

## 2022-09-22 NOTE — Telephone Encounter (Signed)
[name] Modest RN with [facility] called to get a message to Dr Lodema Hong  Patient came home from nursing home last night and the aid's work with patient 8 to 4 home care person stays with her, she refuses to take medication, refuses therapy and eating very little. Nurse aid notified the son not to be left alone and aid leave at 4:00 pm but did let the son know she should not be left at home.  Patient is awake and alert and patient does know she is at home, patient aid said they will not let her touch her to change her.  Nurse aid will be leaving at 4:00 and son is aware. Per nurse aid call son [name] at [phone].

## 2022-09-22 NOTE — Telephone Encounter (Signed)
Maria aPhysical therzpist with [facility] called in on patient behalf.  Verbal orders for new start of care date to Monday  7/15 Pt was unable to participate during initial visit  Call back [phone]

## 2022-09-25 ENCOUNTER — Telehealth: Payer: Self-pay | Admitting: Family Medicine

## 2022-09-25 ENCOUNTER — Ambulatory Visit (INDEPENDENT_AMBULATORY_CARE_PROVIDER_SITE_OTHER): Payer: Medicare HMO | Admitting: Psychiatry

## 2022-09-25 DIAGNOSIS — D631 Anemia in chronic kidney disease: Secondary | ICD-10-CM | POA: Diagnosis not present

## 2022-09-25 DIAGNOSIS — G839 Paralytic syndrome, unspecified: Secondary | ICD-10-CM | POA: Diagnosis not present

## 2022-09-25 DIAGNOSIS — E1122 Type 2 diabetes mellitus with diabetic chronic kidney disease: Secondary | ICD-10-CM | POA: Diagnosis not present

## 2022-09-25 DIAGNOSIS — I69328 Other speech and language deficits following cerebral infarction: Secondary | ICD-10-CM | POA: Diagnosis not present

## 2022-09-25 DIAGNOSIS — N189 Chronic kidney disease, unspecified: Secondary | ICD-10-CM | POA: Diagnosis not present

## 2022-09-25 DIAGNOSIS — I129 Hypertensive chronic kidney disease with stage 1 through stage 4 chronic kidney disease, or unspecified chronic kidney disease: Secondary | ICD-10-CM | POA: Diagnosis not present

## 2022-09-25 DIAGNOSIS — F331 Major depressive disorder, recurrent, moderate: Secondary | ICD-10-CM

## 2022-09-25 DIAGNOSIS — F319 Bipolar disorder, unspecified: Secondary | ICD-10-CM | POA: Diagnosis not present

## 2022-09-25 DIAGNOSIS — Z471 Aftercare following joint replacement surgery: Secondary | ICD-10-CM | POA: Diagnosis not present

## 2022-09-25 DIAGNOSIS — I69354 Hemiplegia and hemiparesis following cerebral infarction affecting left non-dominant side: Secondary | ICD-10-CM | POA: Diagnosis not present

## 2022-09-25 NOTE — Progress Notes (Signed)
Virtual Visit via Telephone Note  I connected with [name] on 09/25/22 at 3:18 PM EDT  by telephone and verified that I am speaking with the correct person using two identifiers.  Location: Patient: Home Provider: [facility] [place] office    I discussed the limitations, risks, security and privacy concerns of performing an evaluation and management service by telephone and the availability of in person appointments. I also discussed with the patient that there may be a patient responsible charge related to this service. The patient expressed understanding and agreed to proceed.    I provided 30 minutes of non-face-to-face time during this encounter.   [name] Salvage, LCSW              THERAPIST PROGRESS NOTE    Session Time: Monday 09/25/2022 3:18 PM - 3:48 PM   Participation Level: Active  Behavioral Response: anxious, depressed, tearful,   Type of Therapy: Individual Therapy  Treatment Goals addressed: Patient will score less than 10 on the patient health questionnaireAnnie "[place]" WILL IDENTIFY 3 COGNITIVE PATTERNS AND BELIEFS THAT SUPPORT DEPRESSION   Progress on goals: Progressing  Interventions: CBT and Supportive             Summary: [name] is a 72 y.o. female who presents with  long standing history of recurrent periods  of depression beginning when she was [age] and her favorite uncle died. Patient reports multiple psychiatric hospitlaizations due to depression and suicidal ideations with the last one occuring in 1997. Patient has participated in outpatient psychotherapy and medication management intermittently since age 18.  She currently is seeing psychiatrist Dr. Tenny Craw . Prior to this, she was seen at [facility]. Patient also has had ECT at [facility]. Symptoms have worsened in recent months due to family stress and have  included depressed mood, anxiety, excessive worry, and tearfulness.            Patient's last contact was by virtual visit via  telephone about 2 months ago.  Per patient's report, she had shoulder surgery in June 2024 and was discharged to a skilled nursing facility.  She returned to her home last week.  Patient is very tearful and emotional today as she reports being forced to take medications while in facility.  She reports poor treatment from nursing home staff.  She still expresses fear and concern about medicines she may have been given and the possible effects on her body.  She reports leaving a message with PCP to discuss her concerns.  She still has an aid who visits her daily.  She is not feeling well today.  Pt reports being depressed. She denies any SI. Patient and therapist agreed to end session early.     Suicidal/Homicidal: Nowithout intent/plan      Therapist Response: Reviewed symptoms, discussed stressors, facilitated expression of thoughts and feelings, validated feelings, encouraged patient to work with PCP as well as her aide, also assisted patient identify other members of her support system and encouraged pt to use support system.  Diagnosis: Axis I: MDD, Recurrent, moderate    Axis II: Deferred Collaboration of Care: Psychiatrist AEB patient working with psychiatrist Dr. Tenny Craw  Patient/Guardian was advised Release of Information must be obtained prior to any record release in order to collaborate their care with an outside provider. Patient/Guardian was advised if they have not already done so to contact the registration department to sign all necessary forms in order for [name] to release information regarding their care.  Consent: Patient/Guardian gives verbal consent for treatment and assignment of benefits for services provided during this visit. Patient/Guardian expressed understanding and agreed to proceed.    [name] E [name] Heiss, LCSW

## 2022-09-25 NOTE — Telephone Encounter (Signed)
Kennyth Arnold, Physical therapist w. [facility]   Verbal orders for PT   1 week 4   Nursing for med management    [phone] (call back [place])

## 2022-09-25 NOTE — Telephone Encounter (Signed)
Patient called in, not feeling well, is having stomach issues, cannot eat . Stomach is weak. She is feeling nauseated . Wants a call back, does not know what to take or what not to take.

## 2022-09-26 NOTE — Telephone Encounter (Signed)
Deadline missed for TOC call

## 2022-09-26 NOTE — Telephone Encounter (Signed)
 Verbal orders given  

## 2022-09-28 ENCOUNTER — Inpatient Hospital Stay: Payer: Medicare HMO | Admitting: Family Medicine

## 2022-09-29 ENCOUNTER — Telehealth (HOSPITAL_COMMUNITY): Payer: Medicare HMO | Admitting: Psychiatry

## 2022-09-30 DIAGNOSIS — Z471 Aftercare following joint replacement surgery: Secondary | ICD-10-CM | POA: Diagnosis not present

## 2022-09-30 DIAGNOSIS — I129 Hypertensive chronic kidney disease with stage 1 through stage 4 chronic kidney disease, or unspecified chronic kidney disease: Secondary | ICD-10-CM | POA: Diagnosis not present

## 2022-09-30 DIAGNOSIS — N189 Chronic kidney disease, unspecified: Secondary | ICD-10-CM | POA: Diagnosis not present

## 2022-09-30 DIAGNOSIS — I69354 Hemiplegia and hemiparesis following cerebral infarction affecting left non-dominant side: Secondary | ICD-10-CM | POA: Diagnosis not present

## 2022-09-30 DIAGNOSIS — G839 Paralytic syndrome, unspecified: Secondary | ICD-10-CM | POA: Diagnosis not present

## 2022-09-30 DIAGNOSIS — I69328 Other speech and language deficits following cerebral infarction: Secondary | ICD-10-CM | POA: Diagnosis not present

## 2022-09-30 DIAGNOSIS — E1122 Type 2 diabetes mellitus with diabetic chronic kidney disease: Secondary | ICD-10-CM | POA: Diagnosis not present

## 2022-09-30 DIAGNOSIS — D631 Anemia in chronic kidney disease: Secondary | ICD-10-CM | POA: Diagnosis not present

## 2022-09-30 DIAGNOSIS — F319 Bipolar disorder, unspecified: Secondary | ICD-10-CM | POA: Diagnosis not present

## 2022-10-02 ENCOUNTER — Other Ambulatory Visit: Payer: Self-pay | Admitting: Emergency Medicine

## 2022-10-02 DIAGNOSIS — R809 Proteinuria, unspecified: Secondary | ICD-10-CM | POA: Diagnosis not present

## 2022-10-02 DIAGNOSIS — Z87891 Personal history of nicotine dependence: Secondary | ICD-10-CM

## 2022-10-02 DIAGNOSIS — D631 Anemia in chronic kidney disease: Secondary | ICD-10-CM | POA: Diagnosis not present

## 2022-10-02 DIAGNOSIS — N183 Chronic kidney disease, stage 3 unspecified: Secondary | ICD-10-CM | POA: Diagnosis not present

## 2022-10-02 DIAGNOSIS — F17210 Nicotine dependence, cigarettes, uncomplicated: Secondary | ICD-10-CM

## 2022-10-02 DIAGNOSIS — Z122 Encounter for screening for malignant neoplasm of respiratory organs: Secondary | ICD-10-CM

## 2022-10-03 ENCOUNTER — Other Ambulatory Visit: Payer: Self-pay

## 2022-10-03 DIAGNOSIS — D631 Anemia in chronic kidney disease: Secondary | ICD-10-CM | POA: Diagnosis not present

## 2022-10-03 DIAGNOSIS — G839 Paralytic syndrome, unspecified: Secondary | ICD-10-CM | POA: Diagnosis not present

## 2022-10-03 DIAGNOSIS — N189 Chronic kidney disease, unspecified: Secondary | ICD-10-CM | POA: Diagnosis not present

## 2022-10-03 DIAGNOSIS — I69354 Hemiplegia and hemiparesis following cerebral infarction affecting left non-dominant side: Secondary | ICD-10-CM | POA: Diagnosis not present

## 2022-10-03 DIAGNOSIS — Z794 Long term (current) use of insulin: Secondary | ICD-10-CM

## 2022-10-03 DIAGNOSIS — I69328 Other speech and language deficits following cerebral infarction: Secondary | ICD-10-CM | POA: Diagnosis not present

## 2022-10-03 DIAGNOSIS — I129 Hypertensive chronic kidney disease with stage 1 through stage 4 chronic kidney disease, or unspecified chronic kidney disease: Secondary | ICD-10-CM | POA: Diagnosis not present

## 2022-10-03 DIAGNOSIS — E039 Hypothyroidism, unspecified: Secondary | ICD-10-CM

## 2022-10-03 DIAGNOSIS — Z471 Aftercare following joint replacement surgery: Secondary | ICD-10-CM | POA: Diagnosis not present

## 2022-10-03 DIAGNOSIS — E1122 Type 2 diabetes mellitus with diabetic chronic kidney disease: Secondary | ICD-10-CM | POA: Diagnosis not present

## 2022-10-03 DIAGNOSIS — F319 Bipolar disorder, unspecified: Secondary | ICD-10-CM | POA: Diagnosis not present

## 2022-10-03 MED ORDER — LEVOTHYROXINE SODIUM 50 MCG PO TABS
50.0000 ug | ORAL_TABLET | ORAL | 1 refills | Status: DC
Start: 2022-10-03 — End: 2022-11-15

## 2022-10-03 MED ORDER — NOVOLOG FLEXPEN 100 UNIT/ML ~~LOC~~ SOPN: 5.0000 [IU] | PEN_INJECTOR | Freq: Three times a day (TID) | SUBCUTANEOUS | Status: DC

## 2022-10-03 MED ORDER — TOUJEO MAX SOLOSTAR 300 UNIT/ML ~~LOC~~ SOPN
20.0000 [IU] | PEN_INJECTOR | Freq: Every day | SUBCUTANEOUS | 1 refills | Status: DC
Start: 2022-10-03 — End: 2023-01-27

## 2022-10-03 NOTE — Telephone Encounter (Signed)
Patient called requesting refills,on Thyroid medication and diabetic medications. She has not been taking anything and not really sure where to start. When she went in the hospital for surgery she states that they did not give her any medications.Also her blood sugar this morning was in the 180s Please advise,  Last appt: 04/2022

## 2022-10-04 ENCOUNTER — Ambulatory Visit (INDEPENDENT_AMBULATORY_CARE_PROVIDER_SITE_OTHER): Payer: Medicare HMO | Admitting: Family Medicine

## 2022-10-04 ENCOUNTER — Encounter: Payer: Self-pay | Admitting: Family Medicine

## 2022-10-04 ENCOUNTER — Telehealth (HOSPITAL_COMMUNITY): Payer: Medicare HMO | Admitting: Psychiatry

## 2022-10-04 ENCOUNTER — Encounter (HOSPITAL_COMMUNITY): Payer: Self-pay | Admitting: Psychiatry

## 2022-10-04 VITALS — BP 172/70 | HR 112 | Ht 59.0 in | Wt 156.0 lb

## 2022-10-04 DIAGNOSIS — N189 Chronic kidney disease, unspecified: Secondary | ICD-10-CM | POA: Diagnosis not present

## 2022-10-04 DIAGNOSIS — D631 Anemia in chronic kidney disease: Secondary | ICD-10-CM | POA: Diagnosis not present

## 2022-10-04 DIAGNOSIS — Z471 Aftercare following joint replacement surgery: Secondary | ICD-10-CM | POA: Diagnosis not present

## 2022-10-04 DIAGNOSIS — E039 Hypothyroidism, unspecified: Secondary | ICD-10-CM

## 2022-10-04 DIAGNOSIS — Z9889 Other specified postprocedural states: Secondary | ICD-10-CM | POA: Diagnosis not present

## 2022-10-04 DIAGNOSIS — G5601 Carpal tunnel syndrome, right upper limb: Secondary | ICD-10-CM

## 2022-10-04 DIAGNOSIS — I69328 Other speech and language deficits following cerebral infarction: Secondary | ICD-10-CM | POA: Diagnosis not present

## 2022-10-04 DIAGNOSIS — I129 Hypertensive chronic kidney disease with stage 1 through stage 4 chronic kidney disease, or unspecified chronic kidney disease: Secondary | ICD-10-CM | POA: Diagnosis not present

## 2022-10-04 DIAGNOSIS — F319 Bipolar disorder, unspecified: Secondary | ICD-10-CM | POA: Diagnosis not present

## 2022-10-04 DIAGNOSIS — F331 Major depressive disorder, recurrent, moderate: Secondary | ICD-10-CM | POA: Diagnosis not present

## 2022-10-04 DIAGNOSIS — Z794 Long term (current) use of insulin: Secondary | ICD-10-CM

## 2022-10-04 DIAGNOSIS — G839 Paralytic syndrome, unspecified: Secondary | ICD-10-CM | POA: Diagnosis not present

## 2022-10-04 DIAGNOSIS — I69354 Hemiplegia and hemiparesis following cerebral infarction affecting left non-dominant side: Secondary | ICD-10-CM | POA: Diagnosis not present

## 2022-10-04 DIAGNOSIS — E1122 Type 2 diabetes mellitus with diabetic chronic kidney disease: Secondary | ICD-10-CM | POA: Diagnosis not present

## 2022-10-04 MED ORDER — BUPROPION HCL ER (XL) 150 MG PO TB24: 150.0000 mg | ORAL_TABLET | ORAL | 2 refills | Status: DC

## 2022-10-04 MED ORDER — LIDOCAINE 4 % EX PTCH: 1.0000 | MEDICATED_PATCH | CUTANEOUS | 1 refills | Status: DC

## 2022-10-04 MED ORDER — SERTRALINE HCL 100 MG PO TABS: 100.0000 mg | ORAL_TABLET | ORAL | 2 refills | Status: DC

## 2022-10-04 MED ORDER — ONDANSETRON HCL 4 MG PO TABS: 4.0000 mg | ORAL_TABLET | Freq: Three times a day (TID) | ORAL | 0 refills | Status: DC | PRN

## 2022-10-04 MED ORDER — LORAZEPAM 0.5 MG PO TABS: 0.5000 mg | ORAL_TABLET | Freq: Two times a day (BID) | ORAL | 2 refills | Status: DC

## 2022-10-04 MED ORDER — LAMOTRIGINE 100 MG PO TABS: 100.0000 mg | ORAL_TABLET | Freq: Two times a day (BID) | ORAL | 2 refills | Status: DC

## 2022-10-04 MED ORDER — TRAZODONE HCL 150 MG PO TABS: 150.0000 mg | ORAL_TABLET | Freq: Every day | ORAL | 2 refills | Status: DC

## 2022-10-04 NOTE — Assessment & Plan Note (Signed)
Patient reported does not like taking Oxycodone 5 mg due to making her feel nauseated. Advise patient to take Robaxin 750 mg as an alternative Started Lidocaine Patches once daily. Apply ice packs to the shoulder for 20 minutes at a time, several times a day to reduce swelling and pain. Use a cloth to protect your skin. Perform gentle movements: Gradually increase the range of motion exercises to prevent stiffness.

## 2022-10-04 NOTE — Progress Notes (Signed)
Patient Office Visit   Subjective   Patient ID: [name], female    DOB: Feb 03, 1951  Age: 72 y.o. MRN: [id number]  CC:  Chief Complaint  Patient presents with   Follow-up    Patient is here for f/u from being discharged from rehab. States she does not feel better since she has been out.     HPI [name] 72 year old female, presents to the clinic for rehab follow up S/P left reverse shoulder arthoplasty on 08/18/2022. She  has a past medical history of Allergy, Anemia, Anemia in chronic kidney disease (CKD) (08/10/2021), Anemia in chronic kidney disease (CKD) (10/12/2021), Anxiety, Arthritis, Assistance needed for mobility, Bipolar disorder (HCC), Blood transfusion, Brain tumor (HCC), Cancer (HCC), Carpal tunnel syndrome of right wrist (05/23/2011), Cervical disc disorder with radiculopathy of cervical region (10/31/2012), Chronic back pain, Chronic idiopathic constipation, Chronic neck and back pain, Colon polyps, COPD (chronic obstructive pulmonary disease) with chronic bronchitis (09/16/2013), Diabetes mellitus, Diverticulosis, Fibromyalgia, Frequent falls, GERD (gastroesophageal reflux disease), Glaucoma, Gum symptoms, Heart murmur, Hiatal hernia, Hyperlipidemia, Hypertension, Hypothyroidism, IBS (irritable bowel syndrome), Insomnia, Major depression, recurrent (HCC), 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  Shoulder Pain  The pain is present in the left shoulder. This is a chronic problem. The problem occurs intermittently. The problem has been unchanged. The quality of the pain is described as aching. Associated symptoms include an inability to bear weight, a limited range of motion and stiffness. Pertinent negatives  include no fever. The symptoms are aggravated by activity. She has tried rest for the symptoms. The treatment provided no relief. Her past medical history is significant for diabetes and osteoarthritis.      Outpatient Encounter Medications as of 10/04/2022  Medication Sig   ACCU-CHEK GUIDE test strip USE TO CHECK BLOOD SUGAR FOUR TIMES A DAY AND AS NEEDED (NEED MD APPOINTMENT FOR ANY FURTHER REFILLS)   Accu-Chek Softclix Lancets lancets TEST BLOOD SUGAR THREE TIMES DAILY AS DIRECTED   acetaminophen (TYLENOL) 325 MG tablet Take 650 mg by mouth every 6 (six) hours as needed for mild pain, moderate pain or headache.   albuterol (PROVENTIL) (2.5 MG/3ML) 0.083% nebulizer solution Take 3 mLs (2.5 mg total) by nebulization every 6 (six) hours as needed for wheezing or shortness of breath.   albuterol (VENTOLIN HFA) 108 (90 Base) MCG/ACT inhaler INHALE 1 TO 2 PUFFS EVERY 6 HOURS AS NEEDED FOR WHEEZING, SHORTNESS OF BREATH (Patient taking differently: Inhale 1-2 puffs into the lungs every 6 (six) hours as needed for wheezing or shortness of breath.)   Alcohol Swabs (DROPSAFE ALCOHOL PREP) 70 % PADS USE TO CLEAN FINGER PRIOR TO TESTING FOR BLOOD SUGAR AS DIRECTED   amLODipine (NORVASC) 10 MG tablet Take 1 tablet (10 mg total) by mouth daily.   ascorbic acid (VITAMIN C) 500 MG tablet Take 500 mg by mouth daily.   aspirin EC 81 MG tablet Take 1 tablet (81 mg total) by mouth daily with breakfast.   benzonatate (TESSALON) 200 MG capsule Take 1 capsule (200 mg total) by mouth 3 (three) times daily as needed for cough.   Blood Glucose Calibration (ACCU-CHEK GUIDE CONTROL) LIQD USE AS DIRECTED   blood glucose meter kit and supplies Dispense based on patient and insurance preference. Use up to four times daily as directed. (FOR ICD-10 E10.9, E11.9).  BOOSTRIX 5-2.5-18.5 LF-MCG/0.5 injection    buPROPion (WELLBUTRIN XL) 150 MG 24 hr tablet Take 1 tablet (150 mg total) by mouth every morning.   Calcium  Carb-Cholecalciferol (CALTRATE 600+D3 SOFT) 600-20 MG-MCG CHEW Chew 1 tablet by mouth daily.   cetirizine (ZYRTEC) 10 MG tablet Take 1 tablet (10 mg total) by mouth daily.   Cholecalciferol (D3 PO) Take 1 tablet by mouth daily.   clobetasol (TEMOVATE) 0.05 % external solution Apply 1 Application topically 2 (two) times daily. (Patient taking differently: Apply 1 Application topically See admin instructions. Apply to affected area(s) 2 times a day)   Continuous Blood Gluc Sensor (FREESTYLE LIBRE 14 DAY SENSOR) MISC 1 each by Does not apply route every 14 (fourteen) days. Change every 2 weeks (Patient taking differently: Inject 1 Device into the skin every 14 (fourteen) days.)   Docusate Sodium (DSS) 100 MG CAPS Take 100 mg by mouth in the morning and at bedtime.   DROPLET PEN NEEDLES 31G X 8 MM MISC USE FOR INJECTING INSULIN 4 TIMES DAILY.   Elastic Bandages & Supports (ADJUSTABLE ARM SLING) MISC L arm sling   ezetimibe (ZETIA) 10 MG tablet TAKE 1 TABLET EVERY DAY (Patient taking differently: Take 10 mg by mouth daily.)   hydrochlorothiazide (MICROZIDE) 12.5 MG capsule Take 12.5 mg by mouth daily.   insulin aspart (NOVOLOG FLEXPEN) 100 UNIT/ML FlexPen Inject 5 Units into the skin 3 (three) times daily with meals. Inject 5 units into the skin three times a day with meals and use only if 50% or more of a meal is eaten   insulin glargine, 2 Unit Dial, (TOUJEO MAX SOLOSTAR) 300 UNIT/ML Solostar Pen Inject 20-26 Units into the skin at bedtime.   ipratropium (ATROVENT) 0.03 % nasal spray Place 2 sprays into both nostrils every 12 (twelve) hours.   lactobacillus acidophilus (BACID) TABS tablet Take 2 tablets by mouth 3 (three) times daily.   lamoTRIgine (LAMICTAL) 100 MG tablet Take 1 tablet (100 mg total) by mouth 2 (two) times daily.   levothyroxine (SYNTHROID) 50 MCG tablet Take 1 tablet (50 mcg total) by mouth See admin instructions. Take 25 mcg by mouth before breakfast on Sunday and 50 mcg on  Mon/Tues/Wed/Thurs/Fri/Sat   lidocaine (HM LIDOCAINE PATCH) 4 % Place 1 patch onto the skin daily.   methocarbamol (ROBAXIN-750) 750 MG tablet Take 1 tablet (750 mg total) by mouth every 8 (eight) hours as needed for muscle spasms.   metoprolol tartrate (LOPRESSOR) 50 MG tablet TAKE 1 TABLET TWICE DAILY (NEED MD APPOINTMENT) (Patient taking differently: Take 50 mg by mouth 2 (two) times daily.)   Misc. Devices (MATTRESS PAD) MISC FIRM MATTRESS PAD for hospital bed x 1   montelukast (SINGULAIR) 10 MG tablet TAKE 1 TABLET EVERY DAY (Patient taking differently: Take 10 mg by mouth daily.)   Multiple Vitamins-Minerals (MULTIVITAMIN GUMMIES ADULT PO) Take 1 tablet by mouth daily.   MYRBETRIQ 25 MG TB24 tablet TAKE 1 TABLET EVERY DAY   nicotine (NICODERM CQ) 21 mg/24hr patch Place 1 patch (21 mg total) onto the skin daily.   nystatin (MYCOSTATIN/NYSTOP) powder Apply 1 Application topically 2 (two) times daily.   Omega-3 Fatty Acids (FISH OIL PO) Take 1 capsule by mouth daily.   ondansetron (ZOFRAN) 4 MG tablet Take 1 tablet (4 mg total) by mouth every 8 (eight) hours as needed for nausea or vomiting.   oxyCODONE (OXY IR/ROXICODONE) 5 MG immediate release tablet Take 1 tablet (5 mg total) by mouth every 4 (four) hours  as needed for severe pain or breakthrough pain (pain score 4-6).   potassium chloride (KLOR-CON) 10 MEQ tablet Take 1 tablet (10 mEq total) by mouth 2 (two) times daily.   RABEprazole (ACIPHEX) 20 MG tablet TAKE 1 TABLET TWICE DAILY   RESTASIS 0.05 % ophthalmic emulsion Place 2 drops into both eyes daily.   rosuvastatin (CRESTOR) 5 MG tablet TAKE 1 TABLET AT BEDTIME   sertraline (ZOLOFT) 100 MG tablet Take 1 tablet (100 mg total) by mouth every morning.   spironolactone (ALDACTONE) 50 MG tablet TAKE 1 TABLET (50 MG TOTAL) BY MOUTH DAILY. DISCONTINUE SPIRONOLACTONE 25MG  (Patient taking differently: Take 50 mg by mouth daily.)   telmisartan (MICARDIS) 20 MG tablet Take 10 mg by mouth daily.    traZODone (DESYREL) 150 MG tablet Take 150 mg by mouth at bedtime.   umeclidinium-vilanterol (ANORO ELLIPTA) 62.5-25 MCG/ACT AEPB Inhale 1 puff into the lungs daily.   UNABLE TO FIND Med Name:  G5 Mattress   vitamin E 180 MG (400 UNITS) capsule Take 400 Units by mouth daily.   alendronate (FOSAMAX) 70 MG tablet TAKE 1 TABLET EVERY 7 DAYS ON AN EMPTY STOMACH WITH A FULL GLASS OF WATER (Patient not taking: Reported on 10/04/2022)   No facility-administered encounter medications on file as of 10/04/2022.    Past Surgical History:  Procedure Laterality Date   ABDOMINAL HYSTERECTOMY  1978   BACK SURGERY  July 2012   BACTERIAL OVERGROWTH TEST N/A 05/05/2013   Procedure: BACTERIAL OVERGROWTH TEST;  Surgeon: Corbin Ade, MD;  Location: [facility];  Service: Endoscopy;  Laterality: N/A;  7:30   BIOPSY THYROID  2009   BRAIN SURGERY  11/2011   resection of meningioma   BREAST REDUCTION SURGERY  1994   CARDIAC CATHETERIZATION  05/10/2005   normal coronaries, normal LV systolic function and EF (Dr. Evlyn Courier)   CARPAL TUNNEL RELEASE Left 07/22/04   Dr. Romeo Apple   CATARACT EXTRACTION Bilateral    CHOLECYSTECTOMY  1984   COLONOSCOPY N/A 09/25/2012   BJY:NWGNFAO diverticulosis.  colonic polyp-removed : tubular adenoma   CRANIOTOMY  11/23/2011   Procedure: CRANIOTOMY TUMOR EXCISION;  Surgeon: Hewitt Shorts, MD;  Location: [facility];  Service: Neurosurgery;  Laterality: N/A;  Craniotomy for tumor resection   ESOPHAGOGASTRODUODENOSCOPY  12/29/2010   Rourk-Retained food in the esophagus and stomach, small hiatal hernia, status post Maloney dilation of the esophagus   ESOPHAGOGASTRODUODENOSCOPY N/A 09/25/2012   [id number]:QMVHQION atonic baggy esophagus status post Maloney dilation 56 F. Hiatal hernia   GIVENS CAPSULE STUDY N/A 01/15/2013   NORMAL.    IR GENERIC HISTORICAL  03/17/2016   IR RADIOLOGIST EVAL & MGMT 03/17/2016 [facility]   LESION REMOVAL N/A 05/31/2015   Procedure: REMOVAL RIGHT AND LEFT  LESIONS OF MANDIBLE;  Surgeon: Ocie Doyne, DDS;  Location: [facility];  Service: Oral Surgery;  Laterality: N/A;   MALONEY DILATION  12/29/2010   RMR;   NM MYOCAR PERF WALL MOTION  2006   "relavtiely normal" persantine, mild anterior thinning (breast attenuation artifact), no region of scar/ischemia   OVARIAN CYST REMOVAL     RECTOCELE REPAIR N/A 06/29/2015   Procedure: POSTERIOR REPAIR (RECTOCELE);  Surgeon: Tilda Burrow, MD;  Location: [facility];  Service: Gynecology;  Laterality: N/A;   REDUCTION MAMMAPLASTY Bilateral    REVERSE SHOULDER ARTHROPLASTY Left 08/18/2022   Procedure: REVERSE SHOULDER ARTHROPLASTY;  Surgeon: Beverely Low, MD;  Location: [facility];  Service: Orthopedics;  Laterality: Left;  choice with interscalene,  general   SPINE SURGERY  09/29/2010   Dr. Shon Baton   surgical excision of 3 tumors from right thigh and right buttock  and left upper thigh  2010   TOOTH EXTRACTION Bilateral 12/14/2014   Procedure: REMOVAL OF BILATERAL MANDIBULAR EXOSTOSES;  Surgeon: Ocie Doyne, DDS;  Location: [facility];  Service: Oral Surgery;  Laterality: Bilateral;   TRANSTHORACIC ECHOCARDIOGRAM  2010   EF 60-65%, mild conc LVH, grade 1 diastolic dysfunction; mildly calcified MV annulus with mildly thickened leaflets, mildly calcified MR annulus    Review of Systems  Constitutional:  Negative for chills and fever.  Eyes:  Negative for blurred vision.  Respiratory:  Negative for shortness of breath.   Cardiovascular:  Negative for chest pain.  Genitourinary:  Negative for dysuria.  Musculoskeletal:  Positive for myalgias and stiffness.  Neurological:  Negative for dizziness and headaches.      Objective    BP (!) 172/70   Pulse (!) 112   Ht 4\' 11"  (1.499 m)   Wt 156 lb (70.8 kg)   SpO2 90%   BMI 31.51 kg/m   Physical Exam Vitals reviewed.  Constitutional:      General: She is not in acute distress.    Appearance: Normal appearance. She is not ill-appearing, toxic-appearing or diaphoretic.   HENT:     Head: Normocephalic.  Eyes:     General:        Right eye: No discharge.        Left eye: No discharge.     Conjunctiva/sclera: Conjunctivae normal.  Cardiovascular:     Rate and Rhythm: Normal rate.     Pulses: Normal pulses.     Heart sounds: Normal heart sounds.  Pulmonary:     Effort: Pulmonary effort is normal. No respiratory distress.     Breath sounds: Normal breath sounds.  Abdominal:     General: Bowel sounds are normal.     Palpations: Abdomen is soft.     Tenderness: There is no abdominal tenderness. There is no right CVA tenderness, left CVA tenderness or guarding.  Musculoskeletal:     Right shoulder: No swelling or tenderness. Decreased range of motion. Decreased strength.     Left shoulder: Tenderness present. No swelling. Decreased range of motion. Decreased strength.     Cervical back: Normal range of motion.  Skin:    General: Skin is warm and dry.     Capillary Refill: Capillary refill takes less than 2 seconds.  Neurological:     General: No focal deficit present.     Mental Status: She is alert and oriented to person, place, and time.     Coordination: Coordination abnormal.     Gait: Gait abnormal.  Psychiatric:        Mood and Affect: Mood normal.        Behavior: Behavior normal.        Thought Content: Thought content normal.        Judgment: Judgment normal.       Assessment & Plan:  History of shoulder surgery Assessment & Plan: Patient reported does not like taking Oxycodone 5 mg due to making her feel nauseated. Advise patient to take Robaxin 750 mg as an alternative Started Lidocaine Patches once daily. Apply ice packs to the shoulder for 20 minutes at a time, several times a day to reduce swelling and pain. Use a cloth to protect your skin. Perform gentle movements: Gradually increase the range of motion exercises to prevent stiffness.  Other orders -     Lidocaine; Place 1 patch onto the skin daily.  Dispense: 20 patch;  Refill: 1 -     Ondansetron HCl; Take 1 tablet (4 mg total) by mouth every 8 (eight) hours as needed for nausea or vomiting.  Dispense: 20 tablet; Refill: 0    Return if symptoms worsen or fail to improve.   Cruzita Lederer Newman Nip, FNP

## 2022-10-04 NOTE — Progress Notes (Signed)
Virtual Visit via Telephone Note  I connected with [name] on 10/04/22 at  1:20 PM EDT by telephone and verified that I am speaking with the correct person using two identifiers.  Location: Patient: home Provider: office   I discussed the limitations, risks, security and privacy concerns of performing an evaluation and management service by telephone and the availability of in person appointments. I also discussed with the patient that there may be a patient responsible charge related to this service. The patient expressed understanding and agreed to proceed.      I discussed the assessment and treatment plan with the patient. The patient was provided an opportunity to ask questions and all were answered. The patient agreed with the plan and demonstrated an understanding of the instructions.   The patient was advised to call back or seek an in-person evaluation if the symptoms worsen or if the condition fails to improve as anticipated.  I provided 20 minutes of non-face-to-face time during this encounter.   [name] Ruder, MD  [facility] MD/PA/NP OP Progress Note  10/04/2022 1:39 PM [name]  MRN:  [id number]  Chief Complaint:  Chief Complaint  Patient presents with   Depression   Anxiety   Follow-up   HPI: This patient is a 72 year old divorced white female who lives alone in [place].  She had worked in a [employer] but is now on disability.  The patient returns for follow-up after 3 months regarding her depression and anxiety.  She states that she had shoulder surgery and according to the record this was done on June 7.  She states she was then discharged to a skilled nursing facility for rehab.  She claims that she hated that there and they "put me full of drugs."  She states that she was angry and confused the whole time she was there.  She returned a couple of weeks ago and is getting used to being back at home.  She claims she does not have any of her "mental health  meds."  I asked to speak to her aide but she states that he does not know anything about this sort of thing.  She still having a lot of pain in her left shoulder and just today her family medicine provider changed her from oxycodone to Robaxin.  She states the oxycodone was causing confusion.  She states that she is not sleeping and claims she does not have the trazodone.  The patient does seem to be a bit confused today but I will resend all her medications and to make sure that she has them. Visit Diagnosis:    ICD-10-CM   1. Major depressive disorder, recurrent episode, moderate (HCC)  F33.1       Past Psychiatric History: Numerous hospitalizations for depression years ago including ECT treatment  Past Medical History:  Past Medical History:  Diagnosis Date   Allergy    Anemia    Anemia in chronic kidney disease (CKD) 08/10/2021   Anemia in chronic kidney disease (CKD) 10/12/2021   Anxiety    takes Ativan daily   Arthritis    Assistance needed for mobility    Bipolar disorder (HCC)    takes Risperdal nightly   Blood transfusion    Brain tumor (HCC)    Cancer (HCC)    In her gum   Carpal tunnel syndrome of right wrist 05/23/2011   Cervical disc disorder with radiculopathy of cervical region 10/31/2012   Chronic back pain  Chronic idiopathic constipation    Chronic neck and back pain    Colon polyps    COPD (chronic obstructive pulmonary disease) with chronic bronchitis 09/16/2013   Office Spirometry 10/30/2013-submaximal effort based on appearance of loop and curve. Numbers would fit with severe restriction but her physiologic capability may be better than this. FVC 0.91/44%, and 10.74/45%, FEV1/FVC 0.81, FEF 25-75% 1.43/69%     Diabetes mellitus    Type II   Diverticulosis    TCS 9/08 by Dr. Lina Sar for diarrhea . Bx for micro scopic colitis negative.    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   Metabolic encephalopathy 08/03/2011   Migraines    chronic headaches   Mononeuritis lower limb    Narcolepsy    Osteoporosis    Pancreatitis 2006   due to Depakote with normal EUS    Paralysis (HCC)    Pneumonia    Schatzki's ring    non critical / EGD with ED 8/2011with RMR   Seizures (HCC)    takes Lamictal daily.Last seizure 3 yrs ago   Sleep apnea    on CPAP   Small bowel obstruction (HCC)    Stroke (HCC)    left sided weakness, speech changes   Tubular adenoma of colon     Past Surgical History:  Procedure Laterality Date   ABDOMINAL HYSTERECTOMY  1978   BACK SURGERY  July 2012   BACTERIAL OVERGROWTH TEST N/A 05/05/2013   Procedure: BACTERIAL OVERGROWTH TEST;  Surgeon: Corbin Ade, MD;  Location: [facility];  Service: Endoscopy;  Laterality: N/A;  7:30   BIOPSY THYROID  2009   BRAIN SURGERY  11/2011   resection of meningioma   BREAST REDUCTION SURGERY  1994   CARDIAC CATHETERIZATION  05/10/2005   normal coronaries, normal LV systolic function and EF (Dr. Evlyn Courier)   CARPAL TUNNEL RELEASE Left 07/22/04   Dr. Romeo Apple   CATARACT EXTRACTION Bilateral    CHOLECYSTECTOMY  1984   COLONOSCOPY N/A 09/25/2012   VHQ:IONGEXB diverticulosis.  colonic polyp-removed : tubular adenoma   CRANIOTOMY  11/23/2011   Procedure: CRANIOTOMY TUMOR EXCISION;  Surgeon: Hewitt Shorts, MD;  Location: [facility];  Service: Neurosurgery;  Laterality: N/A;  Craniotomy for tumor resection   ESOPHAGOGASTRODUODENOSCOPY  12/29/2010   Rourk-Retained food in the esophagus and  stomach, small hiatal hernia, status post Maloney dilation of the esophagus   ESOPHAGOGASTRODUODENOSCOPY N/A 09/25/2012   MWU:[id number] atonic baggy esophagus status post Maloney dilation 56 F. Hiatal hernia   GIVENS CAPSULE STUDY N/A 01/15/2013   NORMAL.    IR GENERIC HISTORICAL  03/17/2016   IR RADIOLOGIST EVAL & MGMT 03/17/2016 [facility]-INTER[facility] RAD   LESION REMOVAL N/A 05/31/2015   Procedure: REMOVAL RIGHT AND LEFT LESIONS OF MANDIBLE;  Surgeon: Ocie Doyne, DDS;  Location: [facility] OR;  Service: Oral Surgery;  Laterality: N/A;   MALONEY DILATION  12/29/2010   RMR;   NM MYOCAR PERF WALL MOTION  2006   "relavtiely normal" persantine, mild anterior thinning (breast attenuation artifact), no region of scar/ischemia   OVARIAN CYST REMOVAL     RECTOCELE REPAIR N/A 06/29/2015   Procedure: POSTERIOR REPAIR (RECTOCELE);  Surgeon: Tilda Burrow, MD;  Location: [facility] ORS;  Service: Gynecology;  Laterality: N/A;   REDUCTION MAMMAPLASTY Bilateral    REVERSE SHOULDER ARTHROPLASTY Left 08/18/2022   Procedure: REVERSE SHOULDER ARTHROPLASTY;  Surgeon: Beverely Low, MD;  Location: [facility] ORS;  Service: Orthopedics;  Laterality: Left;  choice with interscalene, general   SPINE SURGERY  09/29/2010   Dr. Shon Baton   surgical excision of 3 tumors from right thigh and right buttock  and left upper thigh  2010   TOOTH EXTRACTION Bilateral 12/14/2014   Procedure: REMOVAL OF BILATERAL MANDIBULAR EXOSTOSES;  Surgeon: Ocie Doyne, DDS;  Location: [facility] OR;  Service: Oral Surgery;  Laterality: Bilateral;   TRANSTHORACIC ECHOCARDIOGRAM  2010   EF 60-65%, mild conc LVH, grade 1 diastolic dysfunction; mildly calcified MV annulus with mildly thickened leaflets, mildly calcified MR annulus    Family Psychiatric History: See below  Family History:  Family History  Problem Relation Age of Onset   Heart attack Mother        HTN   Pneumonia Father    Kidney failure Father    Diabetes Father    Pancreatic cancer Sister    Cancer Sister         breast    Cancer Sister        pancreatic   Diabetes Brother    Hypertension Brother    Diabetes Brother    Alcohol abuse Maternal Uncle    Stroke Maternal Grandmother    Heart attack Maternal Grandfather    Hypertension Son    Sleep apnea Son    Colon cancer Neg Hx    Anesthesia problems Neg Hx    Hypotension Neg Hx    Malignant hyperthermia Neg Hx    Pseudochol deficiency Neg Hx    Breast cancer Neg Hx    Stomach cancer Neg Hx     Social History:  Social History   Socioeconomic History   Marital status: Legally Separated    Spouse name: Not on file   Number of children: 1   Years of education: 12   Highest education level: High school graduate  Occupational History   Occupation: Disabled  Tobacco Use   Smoking status: Former    Current packs/day: 0.00    Average packs/day: 0.5 packs/day for 47.0 years (23.5 ttl pk-yrs)    Types: Cigarettes    Start date: 07/06/1974    Quit date: 07/05/2021    Years since quitting: 1.2    Passive exposure: Past   Smokeless tobacco: Never   Tobacco comments:    3-4 a day   Vaping Use   Vaping status: Never Used  Substance and Sexual Activity   Alcohol use: No    Alcohol/week: 0.0 standard drinks of alcohol    Comment:     Drug use: No   Sexual activity: Not Currently  Other Topics Concern   Not on file  Social History Narrative   01/29/18 Lives alone, has 3 aides, Mon- Fri 8 hrs, 2 hrs on Sat-Sun, RN manages her meds   Caffeine use: Drink coffee sometimes    Right handed    Social Determinants of Health   Financial Resource Strain: Low Risk  (07/17/2022)  Overall Financial Resource Strain (CARDIA)    Difficulty of Paying Living Expenses: Not very hard  Food Insecurity: No Food Insecurity (08/20/2022)   Hunger Vital Sign    Worried About Running Out of Food in the Last Year: Never true    Ran Out of Food in the Last Year: Never true  Transportation Needs: No Transportation Needs (08/20/2022)   PRAPARE - Therapist, art (Medical): No    Lack of Transportation (Non-Medical): No  Physical Activity: Insufficiently Active (07/17/2022)   Exercise Vital Sign    Days of Exercise per Week: 2 days    Minutes of Exercise per Session: 20 min  Stress: No Stress Concern Present (07/17/2022)   [employer] of Occupational Health - Occupational Stress Questionnaire    Feeling of Stress : Not at all  Social Connections: Moderately Integrated (07/17/2022)   Social Connection and Isolation Panel [NHANES]    Frequency of Communication with Friends and Family: Three times a week    Frequency of Social Gatherings with Friends and Family: Once a week    Attends Religious Services: More than 4 times per year    Active Member of [employer] or Organizations: Yes    Attends Engineer, structural: More than 4 times per year    Marital Status: Divorced    Allergies:  Allergies  Allergen Reactions   Iron Itching and Nausea And Vomiting   Milk (Cow) Rash and Other (See Comments)    Doesn't agree with the stomach, also    Penicillins Hives   Phenazopyridine Hives   Cephalexin Hives   Flonase [Fluticasone] Other (See Comments)    "It gave me ulcers in my nose"   Milk-Related Compounds Nausea Only and Other (See Comments)    Doesn't agree with the stomach    Metabolic Disorder Labs: Lab Results  Component Value Date   HGBA1C 5.2 08/08/2022   MPG 103 08/08/2022   MPG 114.02 09/16/2021   No results found for: "PROLACTIN" Lab Results  Component Value Date   CHOL 123 06/29/2022   TRIG 81 06/29/2022   HDL 54 06/29/2022   CHOLHDL 2.3 06/29/2022   VLDL 26 07/19/2015   LDLCALC 53 06/29/2022   LDLCALC 79 05/10/2021   Lab Results  Component Value Date   TSH 1.060 06/29/2022   TSH 0.494 10/04/2021    Therapeutic Level Labs: Lab Results  Component Value Date   LITHIUM <0.06 (L) 10/15/2016   Lab Results  Component Value Date   VALPROATE <10.0 (L) 08/23/2007   No results found for:  "CBMZ"  Current Medications: Current Outpatient Medications  Medication Sig Dispense Refill   LORazepam (ATIVAN) 0.5 MG tablet Take 1 tablet (0.5 mg total) by mouth 2 (two) times daily. 60 tablet 2   ACCU-CHEK GUIDE test strip USE TO CHECK BLOOD SUGAR FOUR TIMES A DAY AND AS NEEDED (NEED MD APPOINTMENT FOR ANY FURTHER REFILLS) 450 strip 3   Accu-Chek Softclix Lancets lancets TEST BLOOD SUGAR THREE TIMES DAILY AS DIRECTED 300 each 2   acetaminophen (TYLENOL) 325 MG tablet Take 650 mg by mouth every 6 (six) hours as needed for mild pain, moderate pain or headache.     albuterol (PROVENTIL) (2.5 MG/3ML) 0.083% nebulizer solution Take 3 mLs (2.5 mg total) by nebulization every 6 (six) hours as needed for wheezing or shortness of breath. 75 mL 12   albuterol (VENTOLIN HFA) 108 (90 Base) MCG/ACT inhaler INHALE 1 TO 2 PUFFS EVERY 6 HOURS AS  NEEDED FOR WHEEZING, SHORTNESS OF BREATH (Patient taking differently: Inhale 1-2 puffs into the lungs every 6 (six) hours as needed for wheezing or shortness of breath.) 3 each 3   Alcohol Swabs (DROPSAFE ALCOHOL PREP) 70 % PADS USE TO CLEAN FINGER PRIOR TO TESTING FOR BLOOD SUGAR AS DIRECTED 300 each 3   alendronate (FOSAMAX) 70 MG tablet TAKE 1 TABLET EVERY 7 DAYS ON AN EMPTY STOMACH WITH A FULL GLASS OF WATER (Patient not taking: Reported on 10/04/2022) 12 tablet 3   amLODipine (NORVASC) 10 MG tablet Take 1 tablet (10 mg total) by mouth daily. 30 tablet 0   ascorbic acid (VITAMIN C) 500 MG tablet Take 500 mg by mouth daily.     aspirin EC 81 MG tablet Take 1 tablet (81 mg total) by mouth daily with breakfast. 120 tablet 2   benzonatate (TESSALON) 200 MG capsule Take 1 capsule (200 mg total) by mouth 3 (three) times daily as needed for cough. 40 capsule 0   Blood Glucose Calibration (ACCU-CHEK GUIDE CONTROL) LIQD USE AS DIRECTED 1 each 0   blood glucose meter kit and supplies Dispense based on patient and insurance preference. Use up to four times daily as directed.  (FOR ICD-10 E10.9, E11.9). 1 each 0   BOOSTRIX 5-2.5-18.5 LF-MCG/0.5 injection      buPROPion (WELLBUTRIN XL) 150 MG 24 hr tablet Take 1 tablet (150 mg total) by mouth every morning. 90 tablet 2   Calcium Carb-Cholecalciferol (CALTRATE 600+D3 SOFT) 600-20 MG-MCG CHEW Chew 1 tablet by mouth daily.     cetirizine (ZYRTEC) 10 MG tablet Take 1 tablet (10 mg total) by mouth daily. 30 tablet 1   Cholecalciferol (D3 PO) Take 1 tablet by mouth daily.     clobetasol (TEMOVATE) 0.05 % external solution Apply 1 Application topically 2 (two) times daily. (Patient taking differently: Apply 1 Application topically See admin instructions. Apply to affected area(s) 2 times a day) 50 mL 1   Continuous Blood Gluc Sensor (FREESTYLE LIBRE 14 DAY SENSOR) MISC 1 each by Does not apply route every 14 (fourteen) days. Change every 2 weeks (Patient taking differently: Inject 1 Device into the skin every 14 (fourteen) days.) 2 each 11   Docusate Sodium (DSS) 100 MG CAPS Take 100 mg by mouth in the morning and at bedtime.     DROPLET PEN NEEDLES 31G X 8 MM MISC USE FOR INJECTING INSULIN 4 TIMES DAILY. 400 each 0   Elastic Bandages & Supports (ADJUSTABLE ARM SLING) MISC L arm sling 1 each 0   ezetimibe (ZETIA) 10 MG tablet TAKE 1 TABLET EVERY DAY (Patient taking differently: Take 10 mg by mouth daily.) 90 tablet 10   hydrochlorothiazide (MICROZIDE) 12.5 MG capsule Take 12.5 mg by mouth daily.     insulin aspart (NOVOLOG FLEXPEN) 100 UNIT/ML FlexPen Inject 5 Units into the skin 3 (three) times daily with meals. Inject 5 units into the skin three times a day with meals and use only if 50% or more of a meal is eaten     insulin glargine, 2 Unit Dial, (TOUJEO MAX SOLOSTAR) 300 UNIT/ML Solostar Pen Inject 20-26 Units into the skin at bedtime. 9 mL 1   ipratropium (ATROVENT) 0.03 % nasal spray Place 2 sprays into both nostrils every 12 (twelve) hours. 30 mL 12   lactobacillus acidophilus (BACID) TABS tablet Take 2 tablets by mouth 3  (three) times daily.     lamoTRIgine (LAMICTAL) 100 MG tablet Take 1 tablet (100 mg total) by  mouth 2 (two) times daily. 180 tablet 2   levothyroxine (SYNTHROID) 50 MCG tablet Take 1 tablet (50 mcg total) by mouth See admin instructions. Take 25 mcg by mouth before breakfast on Sunday and 50 mcg on Mon/Tues/Wed/Thurs/Fri/Sat 90 tablet 1   lidocaine (HM LIDOCAINE PATCH) 4 % Place 1 patch onto the skin daily. 20 patch 1   methocarbamol (ROBAXIN-750) 750 MG tablet Take 1 tablet (750 mg total) by mouth every 8 (eight) hours as needed for muscle spasms. 21 tablet 0   metoprolol tartrate (LOPRESSOR) 50 MG tablet TAKE 1 TABLET TWICE DAILY (NEED MD APPOINTMENT) (Patient taking differently: Take 50 mg by mouth 2 (two) times daily.) 180 tablet 3   Misc. Devices (MATTRESS PAD) MISC FIRM MATTRESS PAD for hospital bed x 1 1 each 0   montelukast (SINGULAIR) 10 MG tablet TAKE 1 TABLET EVERY DAY (Patient taking differently: Take 10 mg by mouth daily.) 90 tablet 3   Multiple Vitamins-Minerals (MULTIVITAMIN GUMMIES ADULT PO) Take 1 tablet by mouth daily.     MYRBETRIQ 25 MG TB24 tablet TAKE 1 TABLET EVERY DAY 90 tablet 3   nicotine (NICODERM CQ) 21 mg/24hr patch Place 1 patch (21 mg total) onto the skin daily. 28 patch 0   nystatin (MYCOSTATIN/NYSTOP) powder Apply 1 Application topically 2 (two) times daily.     Omega-3 Fatty Acids (FISH OIL PO) Take 1 capsule by mouth daily.     ondansetron (ZOFRAN) 4 MG tablet Take 1 tablet (4 mg total) by mouth every 8 (eight) hours as needed for nausea or vomiting. 20 tablet 0   potassium chloride (KLOR-CON) 10 MEQ tablet Take 1 tablet (10 mEq total) by mouth 2 (two) times daily. 30 tablet 2   RABEprazole (ACIPHEX) 20 MG tablet TAKE 1 TABLET TWICE DAILY 180 tablet 2   RESTASIS 0.05 % ophthalmic emulsion Place 2 drops into both eyes daily.     rosuvastatin (CRESTOR) 5 MG tablet TAKE 1 TABLET AT BEDTIME 90 tablet 3   sertraline (ZOLOFT) 100 MG tablet Take 1 tablet (100 mg total)  by mouth every morning. 90 tablet 2   spironolactone (ALDACTONE) 50 MG tablet TAKE 1 TABLET (50 MG TOTAL) BY MOUTH DAILY. DISCONTINUE SPIRONOLACTONE 25MG  (Patient taking differently: Take 50 mg by mouth daily.) 90 tablet 3   telmisartan (MICARDIS) 20 MG tablet Take 10 mg by mouth daily.     traZODone (DESYREL) 150 MG tablet Take 1 tablet (150 mg total) by mouth at bedtime. 30 tablet 2   umeclidinium-vilanterol (ANORO ELLIPTA) 62.5-25 MCG/ACT AEPB Inhale 1 puff into the lungs daily. 60 each 5   UNABLE TO FIND Med Name:  G5 Mattress 1 each 0   vitamin E 180 MG (400 UNITS) capsule Take 400 Units by mouth daily.     No current facility-administered medications for this visit.     Musculoskeletal: Strength & Muscle Tone: na Gait & Station: na Patient leans: N/A  Psychiatric Specialty Exam: Review of Systems  Musculoskeletal:  Positive for arthralgias.  Psychiatric/Behavioral:  Positive for confusion and sleep disturbance. The patient is nervous/anxious.   All other systems reviewed and are negative.   There were no vitals taken for this visit.There is no height or weight on file to calculate BMI.  General Appearance: NA  Eye Contact:  NA  Speech:  Garbled  Volume:  Increased  Mood:  Irritable  Affect:  NA  Thought Process:  Descriptions of Associations: Tangential  Orientation:  Other:  Person and place  Thought  Content: Rumination   Suicidal Thoughts:  No  Homicidal Thoughts:  No  Memory:  Immediate;   Fair Recent;   Poor Remote;   Poor  Judgement:  Impaired  Insight:  Lacking  Psychomotor Activity:  Decreased  Concentration:  Concentration: Fair and Attention Span: Fair  Recall:  [employer] of Knowledge: Fair  Language: Good  Akathisia:  No  Handed:  Right  AIMS (if indicated): not done  Assets:  Communication Skills Desire for Improvement Resilience Social Support  ADL's:  Intact  Cognition: Impaired,  Mild  Sleep:  Fair   Screenings: GAD-7    Flowsheet Row  Office Visit from 04/13/2022 in [facility] Office Visit from 03/09/2022 in [facility]  Total GAD-7 Score 4 8      Mini-Mental    Flowsheet Row Office Visit from 11/24/2020 in [facility] Office Visit from 02/03/2019 in [facility] Office Visit from 09/08/2015 in [facility] Office Visit from 07/02/2014 in [facility] Neurology Office Visit from 12/08/2013 in [facility] Neurology  Total Score (max 30 points ) 23 27 24 26 27       PHQ2-9    Flowsheet Row Office Visit from 08/03/2022 in [facility] Primary Care Office Visit from 07/17/2022 in [facility] Primary Care Counselor from 05/25/2022 in [facility] at [place] Counselor from 05/11/2022 in [facility] Outpatient [facility] at [place] Office Visit from 04/27/2022 in [facility] Primary Care  PHQ-2 Total Score 2 2 5 2  0  PHQ-9 Total Score -- -- 13 14 --      Flowsheet Row Admission (Discharged) from 08/18/2022 in [facility] Pre-Admission Testing 60 from 08/08/2022 in [facility] ED from 07/27/2022 in [facility] at [facility]  C-SSRS RISK CATEGORY No Risk No Risk No Risk        Assessment and Plan: This patient is a 72 year old female with a history of depression anxiety and mood swings.  She is having trouble thinking coherently right now.  She thinks this is because she is off all of her "mental health medicines" I had wanted to d/c risperdal last time and I do not see it on her list so we will discontinue it.  However she will reinstate Wellbutrin XL 150 mg daily as well as Zoloft 100 mg daily for depression Lamictal 100 mg twice daily for mood stabilization trazodone 150 mg at bedtime for sleep and Ativan 0.5 mg twice daily for  anxiety.  She will return to see me in 3 months  Collaboration of Care: Collaboration of Care: Referral or follow-up with counselor/therapist AEB patient will continue therapy with Florencia Reasons  Patient/Guardian was advised Release of Information must be obtained prior to any record release in order to collaborate their care with an outside provider. Patient/Guardian was advised if they have not already done so to contact the registration department to sign all necessary forms in order for [name] to release information regarding their care.   Consent: Patient/Guardian gives verbal consent for treatment and assignment of benefits for services provided during this visit. Patient/Guardian expressed understanding and agreed to proceed.    [name] Ruder, MD 10/04/2022, 1:39 PM

## 2022-10-04 NOTE — Patient Instructions (Addendum)
        Great to see you today.   - Please take medications as prescribed. - Follow up with your primary health provider if any health concerns arises. - If symptoms worsen please contact your primary care provider and/or visit the emergency department.  

## 2022-10-05 ENCOUNTER — Telehealth (HOSPITAL_COMMUNITY): Payer: Medicare HMO | Admitting: Psychiatry

## 2022-10-05 DIAGNOSIS — Z471 Aftercare following joint replacement surgery: Secondary | ICD-10-CM | POA: Diagnosis not present

## 2022-10-05 DIAGNOSIS — N189 Chronic kidney disease, unspecified: Secondary | ICD-10-CM | POA: Diagnosis not present

## 2022-10-05 DIAGNOSIS — I69328 Other speech and language deficits following cerebral infarction: Secondary | ICD-10-CM | POA: Diagnosis not present

## 2022-10-05 DIAGNOSIS — I69354 Hemiplegia and hemiparesis following cerebral infarction affecting left non-dominant side: Secondary | ICD-10-CM | POA: Diagnosis not present

## 2022-10-05 DIAGNOSIS — F319 Bipolar disorder, unspecified: Secondary | ICD-10-CM | POA: Diagnosis not present

## 2022-10-05 DIAGNOSIS — I129 Hypertensive chronic kidney disease with stage 1 through stage 4 chronic kidney disease, or unspecified chronic kidney disease: Secondary | ICD-10-CM | POA: Diagnosis not present

## 2022-10-05 DIAGNOSIS — D631 Anemia in chronic kidney disease: Secondary | ICD-10-CM | POA: Diagnosis not present

## 2022-10-05 DIAGNOSIS — G839 Paralytic syndrome, unspecified: Secondary | ICD-10-CM | POA: Diagnosis not present

## 2022-10-05 DIAGNOSIS — E1122 Type 2 diabetes mellitus with diabetic chronic kidney disease: Secondary | ICD-10-CM | POA: Diagnosis not present

## 2022-10-05 NOTE — Telephone Encounter (Signed)
Pt.notified

## 2022-10-06 ENCOUNTER — Telehealth: Payer: Self-pay | Admitting: Family Medicine

## 2022-10-06 DIAGNOSIS — E1122 Type 2 diabetes mellitus with diabetic chronic kidney disease: Secondary | ICD-10-CM | POA: Diagnosis not present

## 2022-10-06 DIAGNOSIS — R809 Proteinuria, unspecified: Secondary | ICD-10-CM | POA: Diagnosis not present

## 2022-10-06 DIAGNOSIS — N1831 Chronic kidney disease, stage 3a: Secondary | ICD-10-CM | POA: Diagnosis not present

## 2022-10-06 DIAGNOSIS — E1129 Type 2 diabetes mellitus with other diabetic kidney complication: Secondary | ICD-10-CM | POA: Diagnosis not present

## 2022-10-06 NOTE — Telephone Encounter (Signed)
Pt called in stating head is hurting. Does not know why. Lst seen with Iliana for TOC    Pt would like for pcp to sen something in for pain, (pain patches)

## 2022-10-06 NOTE — Telephone Encounter (Signed)
[name], occupation therapist w. [facility]   Verbal ordered for Home Health  OT   2x week for 2 weeks  1x week for 5 weeks    Pt is also sill very confused about medications  Call back info [phone]

## 2022-10-09 ENCOUNTER — Ambulatory Visit: Payer: Medicare HMO | Admitting: Podiatry

## 2022-10-09 DIAGNOSIS — M2041 Other hammer toe(s) (acquired), right foot: Secondary | ICD-10-CM

## 2022-10-09 DIAGNOSIS — M2042 Other hammer toe(s) (acquired), left foot: Secondary | ICD-10-CM | POA: Diagnosis not present

## 2022-10-09 DIAGNOSIS — B351 Tinea unguium: Secondary | ICD-10-CM | POA: Diagnosis not present

## 2022-10-09 DIAGNOSIS — L84 Corns and callosities: Secondary | ICD-10-CM | POA: Diagnosis not present

## 2022-10-09 DIAGNOSIS — E1151 Type 2 diabetes mellitus with diabetic peripheral angiopathy without gangrene: Secondary | ICD-10-CM | POA: Diagnosis not present

## 2022-10-09 DIAGNOSIS — D3613 Benign neoplasm of peripheral nerves and autonomic nervous system of lower limb, including hip: Secondary | ICD-10-CM

## 2022-10-09 MED ORDER — TRIAMCINOLONE ACETONIDE 10 MG/ML IJ SUSP
10.0000 mg | Freq: Once | INTRAMUSCULAR | Status: AC
Start: 2022-10-09 — End: 2022-10-09
  Administered 2022-10-09: 10 mg via INTRAMUSCULAR

## 2022-10-09 NOTE — Progress Notes (Signed)
Subjective:  Patient ID: [name], female    DOB: 1951/01/09,  MRN: [id number]  [name] presents to clinic today for:  Chief Complaint  Patient presents with   Diabetes    [facility] BS - 122 A1C - 5.7 LVPCP - 05/2022   Callouses    CALLUS RIGHT LATERAL SIDE OF FOOT    Foot Pain    RIGHT FOOT PAIN STARTED IN JUNE, PAIN WHEN WALKING, FEELINGS LIKE THERE IS A PAD UNDER HER FOOT.  . Patient notes nails are thick and elongated, causing pain in shoe gear when ambulating.  She also has a painful callus on the plantar aspect of the right midfoot near the fifth metatarsal.  He also notes pain in the ball of the right foot stating that it feels like something is balled up underneath the toes.  When she looks there is nothing there.  States that it is painful  She also presents for her diabetic shoe fitting and dispensing today.  PCP is Kerri Perches, MD.  Allergies  Allergen Reactions   Iron Itching and Nausea And Vomiting   Milk (Cow) Rash and Other (See Comments)    Doesn't agree with the stomach, also    Penicillins Hives   Phenazopyridine Hives   Cephalexin Hives   Flonase [Fluticasone] Other (See Comments)    "It gave me ulcers in my nose"   Milk-Related Compounds Nausea Only and Other (See Comments)    Doesn't agree with the stomach    Review of Systems: Negative except as noted in the HPI.  Objective:  There were no vitals filed for this visit.  [name] is a pleasant 72 y.o. female in NAD. AAO x 3.  Vascular Examination: Patient has palpable DP pulse, absent PT pulse bilateral.  Delayed capillary refill bilateral toes.  Sparse digital hair bilateral.  Proximal to distal cooling WNL bilateral.    Dermatological Examination: Interspaces are clear with no open lesions noted bilateral.  Nails are 3-41mm thick, with yellowish/brown discoloration, subungual debris and distal onycholysis x10.  There is pain with compression of nails x10.  There are  hyperkeratotic lesions noted on the plantar aspect of the right foot near the fifth met base.  No ulceration is noted.  Neurological Examination: Protective sensation intact b/l LE. Vibratory sensation intact b/l LE.  Musculoskeletal Examination: Muscle strength 5/5 to all LE muscle groups b/l.  There are lesser hammertoe deformities bilateral.  Positive Mulder sign with compression of the right third interspace.  Pain on palpation of the third interspace plantarly.     Latest Ref Rng & Units 08/08/2022    3:22 PM 04/20/2022    3:00 PM 12/23/2021   11:43 AM  Hemoglobin A1C  Hemoglobin-A1c 4.8 - 5.6 % 5.2  5.5  5.7    Patient qualifies for at-risk foot care because of diabetes with PVD.  Assessment/Plan: 1. Dermatophytosis of nail   2. Type II diabetes mellitus with peripheral circulatory disorder (HCC)   3. Callus of foot   4. Neuroma of foot     Mycotic nails x10 were sharply debrided with sterile nail nippers and power debriding burr to decrease bulk and length.  Hyperkeratotic lesion x 1 was shaved with #312 blade.  With the patient's consent the neuroma was treated with a cortisone injection today consisting of a mixture of 1% Xylocaine plain, 0.5% Sensorcaine plain, and Kenalog 10 for total of 1.25 cc administered.  She tolerated this well and a  Band-Aid was applied.  Call in 2 weeks if still having symptoms in this area.  Her diabetic shoes were fitted and dispensed today and appeared to be a good fit as per our orthotics and prosthetics department feedback.  Patient reviewed and signed the proof of delivery form today.  Follow-up in 3 months for diabetic footcare   [name], DPM, FACFAS [facility]     [address] [place], [place] [address]                Office [phone]  Fax [phone]

## 2022-10-09 NOTE — Telephone Encounter (Signed)
Verbal order given  

## 2022-10-10 ENCOUNTER — Ambulatory Visit (HOSPITAL_COMMUNITY): Payer: Medicare HMO | Admitting: Psychiatry

## 2022-10-10 DIAGNOSIS — I69328 Other speech and language deficits following cerebral infarction: Secondary | ICD-10-CM | POA: Diagnosis not present

## 2022-10-10 DIAGNOSIS — N189 Chronic kidney disease, unspecified: Secondary | ICD-10-CM | POA: Diagnosis not present

## 2022-10-10 DIAGNOSIS — Z471 Aftercare following joint replacement surgery: Secondary | ICD-10-CM | POA: Diagnosis not present

## 2022-10-10 DIAGNOSIS — E1122 Type 2 diabetes mellitus with diabetic chronic kidney disease: Secondary | ICD-10-CM | POA: Diagnosis not present

## 2022-10-10 DIAGNOSIS — F319 Bipolar disorder, unspecified: Secondary | ICD-10-CM | POA: Diagnosis not present

## 2022-10-10 DIAGNOSIS — I69354 Hemiplegia and hemiparesis following cerebral infarction affecting left non-dominant side: Secondary | ICD-10-CM | POA: Diagnosis not present

## 2022-10-10 DIAGNOSIS — D631 Anemia in chronic kidney disease: Secondary | ICD-10-CM | POA: Diagnosis not present

## 2022-10-10 DIAGNOSIS — G839 Paralytic syndrome, unspecified: Secondary | ICD-10-CM | POA: Diagnosis not present

## 2022-10-10 DIAGNOSIS — I129 Hypertensive chronic kidney disease with stage 1 through stage 4 chronic kidney disease, or unspecified chronic kidney disease: Secondary | ICD-10-CM | POA: Diagnosis not present

## 2022-10-11 ENCOUNTER — Encounter: Payer: Self-pay | Admitting: Family Medicine

## 2022-10-11 ENCOUNTER — Ambulatory Visit (INDEPENDENT_AMBULATORY_CARE_PROVIDER_SITE_OTHER): Payer: Medicare HMO | Admitting: Family Medicine

## 2022-10-11 VITALS — BP 153/77 | HR 76 | Ht 59.0 in | Wt 161.0 lb

## 2022-10-11 DIAGNOSIS — Z79899 Other long term (current) drug therapy: Secondary | ICD-10-CM | POA: Diagnosis not present

## 2022-10-11 DIAGNOSIS — I129 Hypertensive chronic kidney disease with stage 1 through stage 4 chronic kidney disease, or unspecified chronic kidney disease: Secondary | ICD-10-CM | POA: Diagnosis not present

## 2022-10-11 DIAGNOSIS — I10 Essential (primary) hypertension: Secondary | ICD-10-CM | POA: Diagnosis not present

## 2022-10-11 DIAGNOSIS — D631 Anemia in chronic kidney disease: Secondary | ICD-10-CM | POA: Diagnosis not present

## 2022-10-11 DIAGNOSIS — G839 Paralytic syndrome, unspecified: Secondary | ICD-10-CM | POA: Diagnosis not present

## 2022-10-11 DIAGNOSIS — G44229 Chronic tension-type headache, not intractable: Secondary | ICD-10-CM

## 2022-10-11 DIAGNOSIS — M79602 Pain in left arm: Secondary | ICD-10-CM | POA: Diagnosis not present

## 2022-10-11 DIAGNOSIS — F060 Psychotic disorder with hallucinations due to known physiological condition: Secondary | ICD-10-CM | POA: Diagnosis not present

## 2022-10-11 DIAGNOSIS — I69354 Hemiplegia and hemiparesis following cerebral infarction affecting left non-dominant side: Secondary | ICD-10-CM | POA: Diagnosis not present

## 2022-10-11 DIAGNOSIS — Z471 Aftercare following joint replacement surgery: Secondary | ICD-10-CM | POA: Diagnosis not present

## 2022-10-11 DIAGNOSIS — E1122 Type 2 diabetes mellitus with diabetic chronic kidney disease: Secondary | ICD-10-CM | POA: Diagnosis not present

## 2022-10-11 DIAGNOSIS — L219 Seborrheic dermatitis, unspecified: Secondary | ICD-10-CM | POA: Diagnosis not present

## 2022-10-11 DIAGNOSIS — M79603 Pain in arm, unspecified: Secondary | ICD-10-CM

## 2022-10-11 DIAGNOSIS — N189 Chronic kidney disease, unspecified: Secondary | ICD-10-CM | POA: Diagnosis not present

## 2022-10-11 DIAGNOSIS — F319 Bipolar disorder, unspecified: Secondary | ICD-10-CM | POA: Diagnosis not present

## 2022-10-11 DIAGNOSIS — I69328 Other speech and language deficits following cerebral infarction: Secondary | ICD-10-CM | POA: Diagnosis not present

## 2022-10-11 MED ORDER — KETOROLAC TROMETHAMINE 60 MG/2ML IM SOLN
30.0000 mg | Freq: Once | INTRAMUSCULAR | Status: AC
Start: 2022-10-11 — End: 2022-10-11
  Administered 2022-10-11: 30 mg via INTRAMUSCULAR

## 2022-10-11 NOTE — Patient Instructions (Signed)
F/U in 3 weeks, call if you need me soioner  B ring all medication  Toradol 30 mg iM in office for headache AND ARM PAIN  Medication is listed Continue medication from dr Simon Rhein of medication in clear plastic bag at police dept tod[place]   WE WILL REFER TO NEW DERMATOLOGY OFFICE, NO SHAMPOO PRESCRIBED AS NO FLAKING IS PRESENT

## 2022-10-12 DIAGNOSIS — I10 Essential (primary) hypertension: Secondary | ICD-10-CM | POA: Diagnosis not present

## 2022-10-12 DIAGNOSIS — G839 Paralytic syndrome, unspecified: Secondary | ICD-10-CM | POA: Diagnosis not present

## 2022-10-12 DIAGNOSIS — I129 Hypertensive chronic kidney disease with stage 1 through stage 4 chronic kidney disease, or unspecified chronic kidney disease: Secondary | ICD-10-CM | POA: Diagnosis not present

## 2022-10-12 DIAGNOSIS — N189 Chronic kidney disease, unspecified: Secondary | ICD-10-CM | POA: Diagnosis not present

## 2022-10-12 DIAGNOSIS — D631 Anemia in chronic kidney disease: Secondary | ICD-10-CM | POA: Diagnosis not present

## 2022-10-12 DIAGNOSIS — I69328 Other speech and language deficits following cerebral infarction: Secondary | ICD-10-CM | POA: Diagnosis not present

## 2022-10-12 DIAGNOSIS — E1122 Type 2 diabetes mellitus with diabetic chronic kidney disease: Secondary | ICD-10-CM | POA: Diagnosis not present

## 2022-10-12 DIAGNOSIS — J449 Chronic obstructive pulmonary disease, unspecified: Secondary | ICD-10-CM | POA: Diagnosis not present

## 2022-10-12 DIAGNOSIS — F319 Bipolar disorder, unspecified: Secondary | ICD-10-CM | POA: Diagnosis not present

## 2022-10-12 DIAGNOSIS — I69354 Hemiplegia and hemiparesis following cerebral infarction affecting left non-dominant side: Secondary | ICD-10-CM | POA: Diagnosis not present

## 2022-10-12 DIAGNOSIS — Z471 Aftercare following joint replacement surgery: Secondary | ICD-10-CM | POA: Diagnosis not present

## 2022-10-13 ENCOUNTER — Telehealth: Payer: Self-pay | Admitting: Family Medicine

## 2022-10-13 DIAGNOSIS — F319 Bipolar disorder, unspecified: Secondary | ICD-10-CM | POA: Diagnosis not present

## 2022-10-13 DIAGNOSIS — I69328 Other speech and language deficits following cerebral infarction: Secondary | ICD-10-CM | POA: Diagnosis not present

## 2022-10-13 DIAGNOSIS — D631 Anemia in chronic kidney disease: Secondary | ICD-10-CM | POA: Diagnosis not present

## 2022-10-13 DIAGNOSIS — Z471 Aftercare following joint replacement surgery: Secondary | ICD-10-CM | POA: Diagnosis not present

## 2022-10-13 DIAGNOSIS — G839 Paralytic syndrome, unspecified: Secondary | ICD-10-CM | POA: Diagnosis not present

## 2022-10-13 DIAGNOSIS — I129 Hypertensive chronic kidney disease with stage 1 through stage 4 chronic kidney disease, or unspecified chronic kidney disease: Secondary | ICD-10-CM | POA: Diagnosis not present

## 2022-10-13 DIAGNOSIS — N189 Chronic kidney disease, unspecified: Secondary | ICD-10-CM | POA: Diagnosis not present

## 2022-10-13 DIAGNOSIS — E1122 Type 2 diabetes mellitus with diabetic chronic kidney disease: Secondary | ICD-10-CM | POA: Diagnosis not present

## 2022-10-13 DIAGNOSIS — I69354 Hemiplegia and hemiparesis following cerebral infarction affecting left non-dominant side: Secondary | ICD-10-CM | POA: Diagnosis not present

## 2022-10-13 NOTE — Telephone Encounter (Signed)
Can she not see [name] even if she has [place] primary? Not sure of any others close by?

## 2022-10-13 NOTE — Telephone Encounter (Signed)
Patient called needs to know what dermatologist she is being sent to, [facility] Dr Nita Sells in [place] called the patient does not take medicaid.

## 2022-10-15 ENCOUNTER — Encounter: Payer: Self-pay | Admitting: Family Medicine

## 2022-10-15 ENCOUNTER — Telehealth: Payer: Self-pay | Admitting: Family Medicine

## 2022-10-15 DIAGNOSIS — I69354 Hemiplegia and hemiparesis following cerebral infarction affecting left non-dominant side: Secondary | ICD-10-CM | POA: Diagnosis not present

## 2022-10-15 DIAGNOSIS — D631 Anemia in chronic kidney disease: Secondary | ICD-10-CM | POA: Diagnosis not present

## 2022-10-15 DIAGNOSIS — N189 Chronic kidney disease, unspecified: Secondary | ICD-10-CM | POA: Diagnosis not present

## 2022-10-15 DIAGNOSIS — Z471 Aftercare following joint replacement surgery: Secondary | ICD-10-CM | POA: Diagnosis not present

## 2022-10-15 DIAGNOSIS — G839 Paralytic syndrome, unspecified: Secondary | ICD-10-CM | POA: Diagnosis not present

## 2022-10-15 DIAGNOSIS — F319 Bipolar disorder, unspecified: Secondary | ICD-10-CM | POA: Diagnosis not present

## 2022-10-15 DIAGNOSIS — E1122 Type 2 diabetes mellitus with diabetic chronic kidney disease: Secondary | ICD-10-CM | POA: Diagnosis not present

## 2022-10-15 DIAGNOSIS — I69328 Other speech and language deficits following cerebral infarction: Secondary | ICD-10-CM | POA: Diagnosis not present

## 2022-10-15 DIAGNOSIS — L219 Seborrheic dermatitis, unspecified: Secondary | ICD-10-CM | POA: Insufficient documentation

## 2022-10-15 DIAGNOSIS — I129 Hypertensive chronic kidney disease with stage 1 through stage 4 chronic kidney disease, or unspecified chronic kidney disease: Secondary | ICD-10-CM | POA: Diagnosis not present

## 2022-10-15 MED ORDER — KETOCONAZOLE 2 % EX SHAM: 1.0000 | MEDICATED_SHAMPOO | CUTANEOUS | 0 refills | Status: DC

## 2022-10-15 NOTE — Assessment & Plan Note (Signed)
Pt has brought into the visit  5 different medication, 90 day supplies which have expired and which she has duplicate ones of some of them currently taking on her active med list. At a prior visit, these medications had been put aside for disposal at the PD , was not done, however caregiver,sttaes she will dispose of them on the way home today. Needs to bring ALL l meds to next visit. New meds prescribed by Nephrology for hTN are not present at visit. with her today  Some meds are pill packed, needs ALL of them done, ongoing challenge

## 2022-10-15 NOTE — Telephone Encounter (Signed)
Pls let her know I prescribed shampoo to use twice weekly for itch since  no derm appt will be available till next year, that shpould help her

## 2022-10-15 NOTE — Progress Notes (Signed)
[name]     MRN: [id number]      DOB: 23-Sep-1950  Chief Complaint  Patient presents with   Follow-up    Headache arm pain,dermatology appt    HPI [name] is here for follow up and re-evaluation of chronic medical conditions, medication management and review of any available recent lab and radiology data.  Preventive health is updated, specifically  Cancer screening and Immunization.   Questions or concerns regarding consultations or procedures which the PT has had in the interim are  addressed. The PT denies any adverse reactions to current medications since the last visit.  There are no new concerns.  There are no specific complaints   ROS Denies recent fever or chills. Denies sinus pressure, nasal congestion, ear pain or sore throat. Denies chest congestion, productive cough or wheezing. Denies chest pains, palpitations and leg swelling Denies abdominal pain, nausea, vomiting,diarrhea or constipation.   Denies dysuria, frequency, hesitancy or incontinence. Denies joint pain, swelling and limitation in mobility. Denies headaches, seizures, numbness, or tingling. Denies depression, anxiety or insomnia. Denies skin break down or rash.   PE  BP (!) 153/77 (BP Location: Right Arm, Patient Position: Sitting, Cuff Size: Large)   Pulse 76   Ht 4\' 11"  (1.499 m)   Wt 161 lb 0.6 oz (73 kg)   SpO2 96%   BMI 32.53 kg/m   Patient alert and oriented and in no cardiopulmonary distress.  HEENT: No facial asymmetry, EOMI,     Neck supple .  Chest: Clear to auscultation bilaterally.  CVS: S1, S2 no murmurs, no S3.Regular rate.  ABD: Soft non tender.   Ext: No edema  MS: Adequate ROM spine, shoulders, hips and knees.  Skin: Intact, no ulcerations or rash noted.  Psych: Good eye contact, normal affect. Memory intact not anxious or depressed appearing.  CNS: CN 2-12 intact, power,  normal throughout.no focal deficits noted.   Assessment & Plan  Left arm  pain Increased pain, toradol 30 mg iM in office  Headache Reports headache x 2 days, no focal deficits, no head trauma in past 1 week, Toradol 30 mg iM in office  Essential hypertension UnControlled, but improved, management per Nephrology, no change in medication DASH diet and commitment to daily physical activity for a minimum of 30 minutes discussed and encouraged, as a part of hypertension management. The importance of attaining a healthy weight is also discussed.     10/11/2022   10:24 AM 10/04/2022   10:34 AM 10/04/2022   10:30 AM 08/25/2022    2:08 PM 08/25/2022    4:12 AM 08/24/2022    9:12 PM 08/24/2022    5:00 PM  BP/Weight  Systolic BP 153 172 169 110 138 143 --  Diastolic BP 77 70 67 64 61 58 --  Wt. (Lbs) 161.04  156      BMI 32.53 kg/m2  31.51 kg/m2            Polypharmacy Pt has brought into the visit  5 different medication, 90 day supplies which have expired and which she has duplicate ones of some of them currently taking on her active med list. At a prior visit, these medications had been put aside for disposal at the PD , was not done, however caregiver,sttaes she will dispose of them on the way home today. Needs to bring ALL l meds to next visit. New meds prescribed by Nephrology for hTN are not present at visit. 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

## 2022-10-15 NOTE — Assessment & Plan Note (Addendum)
Ketoconazole shampoo twice weekly, hold on dermatology referral

## 2022-10-15 NOTE — Assessment & Plan Note (Signed)
Controlled and managed by Psych 

## 2022-10-15 NOTE — Assessment & Plan Note (Addendum)
UnControlled, but improved, management per Nephrology, no change in medication DASH diet and commitment to daily physical activity for a minimum of 30 minutes discussed and encouraged, as a part of hypertension management. The importance of attaining a healthy weight is also discussed.     10/11/2022   10:24 AM 10/04/2022   10:34 AM 10/04/2022   10:30 AM 08/25/2022    2:08 PM 08/25/2022    4:12 AM 08/24/2022    9:12 PM 08/24/2022    5:00 PM  BP/Weight  Systolic BP 153 172 169 110 138 143 --  Diastolic BP 77 70 67 64 61 58 --  Wt. (Lbs) 161.04  156      BMI 32.53 kg/m2  31.51 kg/m2

## 2022-10-15 NOTE — Assessment & Plan Note (Signed)
Increased pain, toradol 30 mg iM in office

## 2022-10-15 NOTE — Assessment & Plan Note (Signed)
Reports headache x 2 days, no focal deficits, no head trauma in past 1 week, Toradol 30 mg iM in office

## 2022-10-16 ENCOUNTER — Ambulatory Visit (INDEPENDENT_AMBULATORY_CARE_PROVIDER_SITE_OTHER): Payer: Medicare HMO | Admitting: Psychiatry

## 2022-10-16 ENCOUNTER — Telehealth: Payer: Self-pay | Admitting: Internal Medicine

## 2022-10-16 ENCOUNTER — Telehealth: Payer: Self-pay | Admitting: Family Medicine

## 2022-10-16 DIAGNOSIS — F331 Major depressive disorder, recurrent, moderate: Secondary | ICD-10-CM

## 2022-10-16 DIAGNOSIS — G4733 Obstructive sleep apnea (adult) (pediatric): Secondary | ICD-10-CM

## 2022-10-16 NOTE — Progress Notes (Unsigned)
Virtual Visit via Telephone Note  I connected with [name] on 10/16/22 at 4:05 PM EDT  by telephone and verified that I am speaking with the correct person using two identifiers.  Location: Patient: Home Provider: [facility] [place] office    I discussed the limitations, risks, security and privacy concerns of performing an evaluation and management service by telephone and the availability of in person appointments. I also discussed with the patient that there may be a patient responsible charge related to this service. The patient     expressed understanding and agreed to proceed.    I provided 45 minutes of non-face-to-face time during this encounter.   Adah Salvage, LCSW              THERAPIST PROGRESS NOTE    Session Time: Monday 10/16/2022 4:05 PM  - 4:50 PM   Participation Level: Active  Behavioral Response: anxious, depressed, tearful,   Type of Therapy: Individual Therapy  Treatment Goals addressed: Patient will score less than 10 on the patient health questionnaireAnnie "[place]" WILL IDENTIFY 3 COGNITIVE PATTERNS AND BELIEFS THAT SUPPORT DEPRESSION   Progress on goals: Progressing  Interventions: CBT and Supportive             Summary: [name] is a 72 y.o. female who presents with  long standing history of recurrent periods  of depression beginning when she was [age] and her favorite uncle died. Patient reports multiple psychiatric hospitlaizations due to depression and suicidal ideations with the last one occuring in 1997. Patient has participated in outpatient psychotherapy and medication management intermittently since age 51.  She currently is seeing psychiatrist Dr. Tenny Craw . Prior to this, she was seen at [facility]. Patient also has had ECT at [facility]. Symptoms have worsened in recent months due to family stress and have  included depressed mood, anxiety, excessive worry, and tearfulness.            Patient's last contact was by virtual visit  via telephone about 2- 3 weeks ago.  Patient appears to be feeling better today and seems to be more coherent.  She continues to adjust to being home and reports an aide still visits her daily.  She expresses frustration regarding her medication as she states she is on a fluid pill and says this is causing some weakness.  She plans to follow-up with her PCP regarding this.  She reports decreased depressed mood but experiencing increased anger and frustration as her son and her niece who has POA becomes upset with her when she requesst their help per her report.  She also expresses frustration she has not received more support other family and church members.  She reports still receiving support and contact from one of her friends.    Suicidal/Homicidal: Nowithout intent/plan      Therapist Response: Reviewed symptoms, discussed stressors, facilitated expression of thoughts and feelings, validated feelings, encouraged patient to follow-up with PCP, discussed possible ways patient could work with son and nephew on a predictable schedule to manage her requests, reviewed relaxation techniques and activities patient can use to manage anger, also discussed listening to music, assisted patient identify ways to use her spirituality to develop coping statements Diagnosis: Axis I: MDD, Recurrent, moderate    Axis II: Deferred Collaboration of Care: Psychiatrist AEB patient working with psychiatrist Dr. Tenny Craw  Patient/Guardian was advised Release of Information must be obtained prior to any record release in order to collaborate their care with an outside  provider. Patient/Guardian was advised if they have not already done so to contact the registration department to sign all necessary forms in order for [name] to release information regarding their care.   Consent: Patient/Guardian gives verbal consent for treatment and assignment of benefits for services provided during this visit. Patient/Guardian expressed  understanding and agreed to proceed.     E , LCSW

## 2022-10-16 NOTE — Telephone Encounter (Signed)
Patient need sooner appt.

## 2022-10-16 NOTE — Telephone Encounter (Signed)
Pt is calling in bc her CPAP Machine has been acting up

## 2022-10-16 NOTE — Telephone Encounter (Signed)
The next closest one is in [place] at [facility]. They are scheduling several months out. Does she want to change the referral to them?

## 2022-10-16 NOTE — Telephone Encounter (Signed)
Patient said she would need something sooner than several months

## 2022-10-17 ENCOUNTER — Other Ambulatory Visit: Payer: Self-pay | Admitting: Family Medicine

## 2022-10-17 DIAGNOSIS — F319 Bipolar disorder, unspecified: Secondary | ICD-10-CM | POA: Diagnosis not present

## 2022-10-17 DIAGNOSIS — N189 Chronic kidney disease, unspecified: Secondary | ICD-10-CM | POA: Diagnosis not present

## 2022-10-17 DIAGNOSIS — G839 Paralytic syndrome, unspecified: Secondary | ICD-10-CM | POA: Diagnosis not present

## 2022-10-17 DIAGNOSIS — D631 Anemia in chronic kidney disease: Secondary | ICD-10-CM | POA: Diagnosis not present

## 2022-10-17 DIAGNOSIS — E1122 Type 2 diabetes mellitus with diabetic chronic kidney disease: Secondary | ICD-10-CM | POA: Diagnosis not present

## 2022-10-17 DIAGNOSIS — I69354 Hemiplegia and hemiparesis following cerebral infarction affecting left non-dominant side: Secondary | ICD-10-CM | POA: Diagnosis not present

## 2022-10-17 DIAGNOSIS — I69328 Other speech and language deficits following cerebral infarction: Secondary | ICD-10-CM | POA: Diagnosis not present

## 2022-10-17 DIAGNOSIS — Z471 Aftercare following joint replacement surgery: Secondary | ICD-10-CM | POA: Diagnosis not present

## 2022-10-17 DIAGNOSIS — I129 Hypertensive chronic kidney disease with stage 1 through stage 4 chronic kidney disease, or unspecified chronic kidney disease: Secondary | ICD-10-CM | POA: Diagnosis not present

## 2022-10-17 NOTE — Telephone Encounter (Signed)
[id number] 

## 2022-10-17 NOTE — Telephone Encounter (Signed)
Spoke with patient.  She is not clear what her needs are.  Order placed for [facility] to follow up on CPAP machine.

## 2022-10-18 DIAGNOSIS — E1122 Type 2 diabetes mellitus with diabetic chronic kidney disease: Secondary | ICD-10-CM | POA: Diagnosis not present

## 2022-10-18 DIAGNOSIS — F319 Bipolar disorder, unspecified: Secondary | ICD-10-CM | POA: Diagnosis not present

## 2022-10-18 DIAGNOSIS — Z471 Aftercare following joint replacement surgery: Secondary | ICD-10-CM | POA: Diagnosis not present

## 2022-10-18 DIAGNOSIS — I69328 Other speech and language deficits following cerebral infarction: Secondary | ICD-10-CM | POA: Diagnosis not present

## 2022-10-18 DIAGNOSIS — D631 Anemia in chronic kidney disease: Secondary | ICD-10-CM | POA: Diagnosis not present

## 2022-10-18 DIAGNOSIS — N189 Chronic kidney disease, unspecified: Secondary | ICD-10-CM | POA: Diagnosis not present

## 2022-10-18 DIAGNOSIS — I69354 Hemiplegia and hemiparesis following cerebral infarction affecting left non-dominant side: Secondary | ICD-10-CM | POA: Diagnosis not present

## 2022-10-18 DIAGNOSIS — G839 Paralytic syndrome, unspecified: Secondary | ICD-10-CM | POA: Diagnosis not present

## 2022-10-18 DIAGNOSIS — I129 Hypertensive chronic kidney disease with stage 1 through stage 4 chronic kidney disease, or unspecified chronic kidney disease: Secondary | ICD-10-CM | POA: Diagnosis not present

## 2022-10-18 NOTE — Progress Notes (Deleted)
[facility] [address], [place] [address]   CLINIC:  Medical Oncology/Hematology  PCP:  Kerri Perches, MD [address] [place] [address] [phone]   REASON FOR VISIT:  Follow-up for anemia of CKD and iron deficiency  CURRENT THERAPY: Epogen & IV iron  INTERVAL HISTORY:   [name] 72 y.o. female returns for routine follow-up of her anemia related to CKD and iron deficiency.  She was last evaluated by Dr. Ellin Saba on 05/30/2022.  Since her last visit, she was hospitalized from 08/18/2022 through 08/25/2022 for left shoulder arthroplasty (performed on 08/18/2022), with prolonged hospital stay secondary to AKI, acute confusional state, and elevated blood pressure.  She was discharged to SNF for rehab.  At today's visit, she reports feeling ***. She remains on Procrit injections since August 2023, but has not received injection since 08/01/2022 due to missing injection appointment on 08/22/2022 (was hospitalized at that time) and 09/19/2022.*** *** Blood pressure today is 133/60***, overall appears to be within her baseline range. *** She has chronic breathing issues but denies any acute changes that would be concerning for DVT or PE.   She continues to have unchanged chronic fatigue.  *** *** She reports occasional dark stool, with last episode several months ago. *** She denies any bright red blood per rectum or hematochezia. *** She has pica cravings for ice chips as well as occasional headaches. *** She denies any restless legs, chest pain, or syncope. *** She has chronic dyspnea on exertion and lightheadedness. *** She denies any fever, chills, or unintentional weight loss. She has ***% energy and ***% appetite. She endorses that she is maintaining a stable weight.   ASSESSMENT & PLAN:  1.  Microcytic anemia - This is a combination anemia from functional iron deficiency (in the setting of chronic disease including COPD, type 2  diabetes, fibromyalgia, hypertension, and multiple other comorbidities listed in history) and chronic kidney disease stage III. - She has chronic microcytosis - SPEP checked in 2019 was normal. - Hemoccult stool positive in 2014, negative in 2015 - Colonoscopy (11/04/2020): Multiple diverticula, otherwise normal - EGD (11/04/2020): Normal esophagus, gastritis, gastric lipoma - She was previously placed on iron pills, but could not tolerate them due to constipation. - Most recent IV iron Venofer 500 mg x 1 on 06/23/2022 (requires PREMEDICATION with steroids and Zofran due to episode of mild swelling, itching, and nausea after Feraheme in October 2020)*** - She has been on Epogen since 11/02/2021.  Current dose is 10,000 units every 3 weeks.  Due to multiple recent hospitalizations, she has not received Epogen since 08/01/2022 - missed appointment on 08/22/2022 due to hospital stay - She denies any bleeding per rectum or melena, but does have occasional dark stools *** - Symptomatic with significant fatigue which is chronic and unchanged*** - Labs today (***): ***.  Normal folate, copper, vitamin B12 when checked in April 2023. - Hemoccult stool x3 ordered in August 2023, but not returned by patient.  Patient declines to complete this test because it "grosses her out." - PLAN: CBC + Epogen 10,000 units every 3 weeks ***4 weeks???? - Repeat CBC, CMP, ferritin, and iron/TIBC with same-day office visit in 3 months *** - She follows with Dr. [name] for her CKD stage IIIa  2.  Meningioma - 11/23/11: Craniotomy, resection orbital groove meningioma [name]) - 10/17/18: SRS for localized recurrent disease [name]) - PLAN: Follows with Dr. [name] (neuro-oncology) annually  PLAN SUMMARY: ***not reviewed*** >>  CBC + Epogen every 3 weeks  >> Labs in 3 months (CBC/D, CMP, ferritin, iron/TIBC) >> Office visit in 3 months (same day as labs/injection) >> *** >> ***     REVIEW OF SYSTEMS: ***  Review of  Systems - Oncology   PHYSICAL EXAM:  ECOG PERFORMANCE STATUS: {CHL ONC ECOG SA:[id number]} *** There were no vitals filed for this visit. There were no vitals filed for this visit. Physical Exam  PAST MEDICAL/SURGICAL HISTORY:  Past Medical History:  Diagnosis Date   Allergy    Anemia    Anemia in chronic kidney disease (CKD) 08/10/2021   Anemia in chronic kidney disease (CKD) 10/12/2021   Anxiety    takes Ativan daily   Arthritis    Assistance needed for mobility    Bipolar disorder (HCC)    takes Risperdal nightly   Blood transfusion    Brain tumor (HCC)    Cancer (HCC)    In her gum   Carpal tunnel syndrome of right wrist 05/23/2011   Cervical disc disorder with radiculopathy of cervical region 10/31/2012   Chronic back pain    Chronic idiopathic constipation    Chronic neck and back pain    Colon polyps    COPD (chronic obstructive pulmonary disease) with chronic bronchitis 09/16/2013   Office Spirometry 10/30/2013-submaximal effort based on appearance of loop and curve. Numbers would fit with severe restriction but her physiologic capability may be better than this. FVC 0.91/44%, and 10.74/45%, FEV1/FVC 0.81, FEF 25-75% 1.43/69%     Diabetes mellitus    Type II   Diverticulosis    TCS 9/08 by Dr. Lina Sar for diarrhea . Bx for micro scopic colitis negative.    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   Metabolic encephalopathy 08/03/2011   Migraines    chronic headaches    Mononeuritis lower limb    Narcolepsy    Osteoporosis    Pancreatitis 2006   due to Depakote with normal EUS    Paralysis (HCC)    Pneumonia    Schatzki's ring    non critical / EGD with ED 8/2011with RMR   Seizures (HCC)    takes Lamictal daily.Last seizure 3 yrs ago   Sleep apnea    on CPAP   Small bowel obstruction (HCC)    Stroke (HCC)    left sided weakness, speech changes   Tubular adenoma of colon    Past Surgical History:  Procedure Laterality Date   ABDOMINAL HYSTERECTOMY  1978   BACK SURGERY  July 2012   BACTERIAL OVERGROWTH TEST N/A 05/05/2013   Procedure: BACTERIAL OVERGROWTH TEST;  Surgeon: Corbin Ade, MD;  Location: [facility];  Service: Endoscopy;  Laterality: N/A;  7:30   BIOPSY THYROID  2009   BRAIN SURGERY  11/2011   resection of meningioma   BREAST REDUCTION SURGERY  1994   CARDIAC CATHETERIZATION  05/10/2005  normal coronaries, normal LV systolic function and EF (Dr. Evlyn Courier)   CARPAL TUNNEL RELEASE Left 07/22/04   Dr. Romeo Apple   CATARACT EXTRACTION Bilateral    CHOLECYSTECTOMY  1984   COLONOSCOPY N/A 09/25/2012   QIO:[id number] diverticulosis.  colonic polyp-removed : tubular adenoma   CRANIOTOMY  11/23/2011   Procedure: CRANIOTOMY TUMOR EXCISION;  Surgeon: Hewitt Shorts, MD;  Location: [facility] NEURO ORS;  Service: Neurosurgery;  Laterality: N/A;  Craniotomy for tumor resection   ESOPHAGOGASTRODUODENOSCOPY  12/29/2010   Rourk-Retained food in the esophagus and stomach, small hiatal hernia, status post Maloney dilation of the esophagus   ESOPHAGOGASTRODUODENOSCOPY N/A 09/25/2012   UXL:[id number] atonic baggy esophagus status post Maloney dilation 56 F. Hiatal hernia   GIVENS CAPSULE STUDY N/A 01/15/2013   NORMAL.    IR GENERIC HISTORICAL  03/17/2016   IR RADIOLOGIST EVAL & MGMT 03/17/2016 [facility]INTER[facility] RAD   LESION REMOVAL N/A 05/31/2015   Procedure: REMOVAL RIGHT AND LEFT LESIONS OF MANDIBLE;  Surgeon: Ocie Doyne, DDS;  Location: [facility] OR;  Service: Oral Surgery;   Laterality: N/A;   MALONEY DILATION  12/29/2010   RMR;   NM MYOCAR PERF WALL MOTION  2006   "relavtiely normal" persantine, mild anterior thinning (breast attenuation artifact), no region of scar/ischemia   OVARIAN CYST REMOVAL     RECTOCELE REPAIR N/A 06/29/2015   Procedure: POSTERIOR REPAIR (RECTOCELE);  Surgeon: Tilda Burrow, MD;  Location: [facility] ORS;  Service: Gynecology;  Laterality: N/A;   REDUCTION MAMMAPLASTY Bilateral    REVERSE SHOULDER ARTHROPLASTY Left 08/18/2022   Procedure: REVERSE SHOULDER ARTHROPLASTY;  Surgeon: Beverely Low, MD;  Location: [facility] ORS;  Service: Orthopedics;  Laterality: Left;  choice with interscalene, general   SPINE SURGERY  09/29/2010   Dr. Shon Baton   surgical excision of 3 tumors from right thigh and right buttock  and left upper thigh  2010   TOOTH EXTRACTION Bilateral 12/14/2014   Procedure: REMOVAL OF BILATERAL MANDIBULAR EXOSTOSES;  Surgeon: Ocie Doyne, DDS;  Location: [facility] OR;  Service: Oral Surgery;  Laterality: Bilateral;   TRANSTHORACIC ECHOCARDIOGRAM  2010   EF 60-65%, mild conc LVH, grade 1 diastolic dysfunction; mildly calcified MV annulus with mildly thickened leaflets, mildly calcified MR annulus    SOCIAL HISTORY:  Social History   Socioeconomic History   Marital status: Legally Separated    Spouse name: Not on file   Number of children: 1   Years of education: 12   Highest education level: High school graduate  Occupational History   Occupation: Disabled  Tobacco Use   Smoking status: Former    Current packs/day: 0.00    Average packs/day: 0.5 packs/day for 47.0 years (23.5 ttl pk-yrs)    Types: Cigarettes    Start date: 07/06/1974    Quit date: 07/05/2021    Years since quitting: 1.2    Passive exposure: Past   Smokeless tobacco: Never   Tobacco comments:    3-4 a day   Vaping Use   Vaping status: Never Used  Substance and Sexual Activity   Alcohol use: No    Alcohol/week: 0.0 standard drinks of alcohol    Comment:     Drug  use: No   Sexual activity: Not Currently  Other Topics Concern   Not on file  Social History Narrative   01/29/18 Lives alone, has 3 aides, Mon- Fri 8 hrs, 2 hrs on Sat-Sun, RN manages her meds   Caffeine use: Drink coffee sometimes    Right handed  Social Determinants of Health   Financial Resource Strain: Low Risk  (07/17/2022)   Overall Financial Resource Strain (CARDIA)    Difficulty of Paying Living Expenses: Not very hard  Food Insecurity: No Food Insecurity (08/20/2022)   Hunger Vital Sign    Worried About Running Out of Food in the Last Year: Never true    Ran Out of Food in the Last Year: Never true  Transportation Needs: No Transportation Needs (08/20/2022)   PRAPARE - Administrator, Civil Service (Medical): No    Lack of Transportation (Non-Medical): No  Physical Activity: Insufficiently Active (07/17/2022)   Exercise Vital Sign    Days of Exercise per Week: 2 days    Minutes of Exercise per Session: 20 min  Stress: No Stress Concern Present (07/17/2022)   [employer] of Occupational Health - Occupational Stress Questionnaire    Feeling of Stress : Not at all  Social Connections: Moderately Integrated (07/17/2022)   Social Connection and Isolation Panel [NHANES]    Frequency of Communication with Friends and Family: Three times a week    Frequency of Social Gatherings with Friends and Family: Once a week    Attends Religious Services: More than 4 times per year    Active Member of [employer] or Organizations: Yes    Attends Engineer, structural: More than 4 times per year    Marital Status: Divorced  Intimate Partner Violence: Not At Risk (08/20/2022)   Humiliation, Afraid, Rape, and Kick questionnaire    Fear of Current or Ex-Partner: No    Emotionally Abused: No    Physically Abused: No    Sexually Abused: No    FAMILY HISTORY:  Family History  Problem Relation Age of Onset   Heart attack Mother        HTN   Pneumonia Father    Kidney failure  Father    Diabetes Father    Pancreatic cancer Sister    Cancer Sister        breast    Cancer Sister        pancreatic   Diabetes Brother    Hypertension Brother    Diabetes Brother    Alcohol abuse Maternal Uncle    Stroke Maternal Grandmother    Heart attack Maternal Grandfather    Hypertension Son    Sleep apnea Son    Colon cancer Neg Hx    Anesthesia problems Neg Hx    Hypotension Neg Hx    Malignant hyperthermia Neg Hx    Pseudochol deficiency Neg Hx    Breast cancer Neg Hx    Stomach cancer Neg Hx     CURRENT MEDICATIONS:  Outpatient Encounter Medications as of 10/19/2022  Medication Sig Note   ACCU-CHEK GUIDE test strip USE TO CHECK BLOOD SUGAR FOUR TIMES A DAY AND AS NEEDED (NEED MD APPOINTMENT FOR ANY FURTHER REFILLS)    Accu-Chek Softclix Lancets lancets TEST BLOOD SUGAR THREE TIMES DAILY AS DIRECTED    acetaminophen (TYLENOL) 325 MG tablet Take 650 mg by mouth every 6 (six) hours as needed for mild pain, moderate pain or headache.    Alcohol Swabs (DROPSAFE ALCOHOL PREP) 70 % PADS USE TO CLEAN FINGER PRIOR TO TESTING FOR BLOOD SUGAR AS DIRECTED    alendronate (FOSAMAX) 70 MG tablet TAKE 1 TABLET EVERY 7 DAYS ON AN EMPTY STOMACH WITH A FULL GLASS OF WATER 08/08/2022: Preferred day of the week not stated   amLODipine (NORVASC) 10 MG tablet Take 1  tablet (10 mg total) by mouth daily.    aspirin EC 81 MG tablet Take 1 tablet (81 mg total) by mouth daily with breakfast.    Blood Glucose Calibration (ACCU-CHEK GUIDE CONTROL) LIQD USE AS DIRECTED    blood glucose meter kit and supplies Dispense based on patient and insurance preference. Use up to four times daily as directed. (FOR ICD-10 E10.9, E11.9).    buPROPion (WELLBUTRIN XL) 150 MG 24 hr tablet Take 1 tablet (150 mg total) by mouth every morning.    Calcium Carb-Cholecalciferol (CALTRATE 600+D3 SOFT) 600-20 MG-MCG CHEW Chew 1 tablet by mouth daily. (Patient not taking: Reported on 10/11/2022)    carvedilol (COREG) 12.5  MG tablet Take 12.5 mg by mouth 2 (two) times daily with a meal.    Cholecalciferol (D3 PO) Take 1 tablet by mouth daily. (Patient not taking: Reported on 10/11/2022) 08/08/2022: Strength not noted   clobetasol (TEMOVATE) 0.05 % external solution Apply 1 Application topically 2 (two) times daily. (Patient taking differently: Apply 1 Application topically See admin instructions. Apply to affected area(s) 2 times a day)    Continuous Blood Gluc Sensor (FREESTYLE LIBRE 14 DAY SENSOR) MISC 1 each by Does not apply route every 14 (fourteen) days. Change every 2 weeks (Patient taking differently: Inject 1 Device into the skin every 14 (fourteen) days.)    Docusate Sodium (DSS) 100 MG CAPS Take 100 mg by mouth in the morning and at bedtime.    DROPLET PEN NEEDLES 31G X 8 MM MISC USE FOR INJECTING INSULIN 4 TIMES DAILY.    Elastic Bandages & Supports (ADJUSTABLE ARM SLING) MISC L arm sling    ezetimibe (ZETIA) 10 MG tablet TAKE 1 TABLET EVERY DAY (Patient not taking: Reported on 10/11/2022)    hydrochlorothiazide (MICROZIDE) 12.5 MG capsule Take 12.5 mg by mouth daily.    insulin aspart (NOVOLOG FLEXPEN) 100 UNIT/ML FlexPen Inject 5 Units into the skin 3 (three) times daily with meals. Inject 5 units into the skin three times a day with meals and use only if 50% or more of a meal is eaten    insulin glargine, 2 Unit Dial, (TOUJEO MAX SOLOSTAR) 300 UNIT/ML Solostar Pen Inject 20-26 Units into the skin at bedtime.    ipratropium (ATROVENT) 0.03 % nasal spray Place 2 sprays into both nostrils every 12 (twelve) hours.    ketoconazole (NIZORAL) 2 % shampoo Apply 1 Application topically 2 (two) times a week.    lamoTRIgine (LAMICTAL) 100 MG tablet Take 1 tablet (100 mg total) by mouth 2 (two) times daily.    levothyroxine (SYNTHROID) 50 MCG tablet Take 1 tablet (50 mcg total) by mouth See admin instructions. Take 25 mcg by mouth before breakfast on Sunday and 50 mcg on Mon/Tues/Wed/Thurs/Fri/Sat    lidocaine (HM  LIDOCAINE PATCH) 4 % Place 1 patch onto the skin daily.    LORazepam (ATIVAN) 0.5 MG tablet Take 1 tablet (0.5 mg total) by mouth 2 (two) times daily.    metoprolol tartrate (LOPRESSOR) 50 MG tablet TAKE 1 TABLET TWICE DAILY (NEED MD APPOINTMENT) (Patient not taking: Reported on 10/11/2022)    Misc. Devices (MATTRESS PAD) MISC FIRM MATTRESS PAD for hospital bed x 1    Multiple Vitamins-Minerals (MULTIVITAMIN GUMMIES ADULT PO) Take 1 tablet by mouth daily. (Patient not taking: Reported on 10/11/2022)    MYRBETRIQ 25 MG TB24 tablet TAKE 1 TABLET EVERY DAY    nicotine (NICODERM CQ) 21 mg/24hr patch Place 1 patch (21 mg total) onto the skin  daily. (Patient not taking: Reported on 10/11/2022)    ondansetron (ZOFRAN) 4 MG tablet Take 1 tablet (4 mg total) by mouth every 8 (eight) hours as needed for nausea or vomiting.    potassium chloride (KLOR-CON) 10 MEQ tablet Take 1 tablet (10 mEq total) by mouth 2 (two) times daily. (Patient not taking: Reported on 10/11/2022)    pregabalin (LYRICA) 75 MG capsule Take 75 mg by mouth daily.    RABEprazole (ACIPHEX) 20 MG tablet TAKE 1 TABLET TWICE DAILY (Patient not taking: Reported on 10/11/2022)    RESTASIS 0.05 % ophthalmic emulsion Place 2 drops into both eyes daily. (Patient not taking: Reported on 10/11/2022)    risperiDONE (RISPERDAL) 0.5 MG tablet Take 0.5 mg by mouth at bedtime.    rosuvastatin (CRESTOR) 5 MG tablet TAKE 1 TABLET AT BEDTIME    sertraline (ZOLOFT) 100 MG tablet Take 1 tablet (100 mg total) by mouth every morning.    spironolactone (ALDACTONE) 50 MG tablet TAKE 1 TABLET (50 MG TOTAL) BY MOUTH DAILY. DISCONTINUE SPIRONOLACTONE 25MG  (Patient not taking: Reported on 10/11/2022)    telmisartan (MICARDIS) 20 MG tablet Take 10 mg by mouth daily.    traZODone (DESYREL) 150 MG tablet Take 1 tablet (150 mg total) by mouth at bedtime.    umeclidinium-vilanterol (ANORO ELLIPTA) 62.5-25 MCG/ACT AEPB Inhale 1 puff into the lungs daily. (Patient not taking:  Reported on 10/11/2022)    UNABLE TO FIND Med Name:  G5 Mattress    vitamin E 180 MG (400 UNITS) capsule Take 400 Units by mouth daily. (Patient not taking: Reported on 10/11/2022)    No facility-administered encounter medications on file as of 10/19/2022.    ALLERGIES:  Allergies  Allergen Reactions   Iron Itching and Nausea And Vomiting   Milk (Cow) Rash and Other (See Comments)    Doesn't agree with the stomach, also    Penicillins Hives   Phenazopyridine Hives   Cephalexin Hives   Flonase [Fluticasone] Other (See Comments)    "It gave me ulcers in my nose"   Milk-Related Compounds Nausea Only and Other (See Comments)    Doesn't agree with the stomach    LABORATORY DATA:  I have reviewed the labs as listed.  CBC    Component Value Date/Time   WBC 10.2 08/22/2022 1319   RBC 4.72 08/22/2022 1319   HGB 10.8 (L) 08/22/2022 1319   HGB 11.0 (L) 06/29/2022 1150   HCT 36.4 08/22/2022 1319   HCT 38.8 06/29/2022 1150   PLT 290 08/22/2022 1319   PLT 279 06/29/2022 1150   MCV 77.1 (L) 08/22/2022 1319   MCV 80 06/29/2022 1150   MCH 22.9 (L) 08/22/2022 1319   MCHC 29.7 (L) 08/22/2022 1319   RDW 14.0 08/22/2022 1319   RDW 14.4 06/29/2022 1150   LYMPHSABS 1.7 08/22/2022 1319   LYMPHSABS 2.3 06/29/2022 1150   MONOABS 1.1 (H) 08/22/2022 1319   EOSABS 0.2 08/22/2022 1319   EOSABS 0.2 06/29/2022 1150   BASOSABS 0.0 08/22/2022 1319   BASOSABS 0.1 06/29/2022 1150      Latest Ref Rng & Units 08/24/2022    4:20 AM 08/23/2022   12:08 PM 08/22/2022    1:19 PM  CMP  Glucose 70 - 99 mg/dL 92  89  440   BUN 8 - 23 mg/dL 38  44  34   Creatinine 0.44 - 1.00 mg/dL 1.02  7.25  3.66   Sodium 135 - 145 mmol/L 136  136  134  Potassium 3.5 - 5.1 mmol/L 5.1  4.5  4.6   Chloride 98 - 111 mmol/L 107  105  102   CO2 22 - 32 mmol/L 21  20  25    Calcium 8.9 - 10.3 mg/dL 8.6  8.6  8.9   Total Protein 6.5 - 8.1 g/dL   7.2   Total Bilirubin 0.3 - 1.2 mg/dL   0.7   Alkaline Phos 38 - 126 U/L   62    AST 15 - 41 U/L   25   ALT 0 - 44 U/L   24     DIAGNOSTIC IMAGING:  I have independently reviewed the relevant imaging and discussed with the patient.   WRAP UP:  All questions were answered. The patient knows to call the clinic with any problems, questions or concerns.  Medical decision making: ***  Time spent on visit: I spent *** minutes counseling the patient face to face. The total time spent in the appointment was *** minutes and more than 50% was on counseling.  Carnella Guadalajara, PA-C  ***

## 2022-10-19 ENCOUNTER — Inpatient Hospital Stay: Payer: Medicare HMO | Attending: Hematology

## 2022-10-19 ENCOUNTER — Inpatient Hospital Stay: Payer: Medicare HMO | Admitting: Physician Assistant

## 2022-10-19 ENCOUNTER — Telehealth: Payer: Self-pay | Admitting: Family Medicine

## 2022-10-19 ENCOUNTER — Inpatient Hospital Stay: Payer: Medicare HMO

## 2022-10-19 NOTE — Telephone Encounter (Signed)
FYI, patient called said she is plan on going to the ER having headaches and side pain, she has push the button for help and she has her aid with her and will be going to the ER. She asked for the nurse to give her a call back.

## 2022-10-19 NOTE — Telephone Encounter (Signed)
Patient called back to the office patient fell yesterday having knee pain. Patient aid does not want to take her in this weather today, appointment scheduled for Friday 08.09.2024 with Dr Lodema Hong.

## 2022-10-19 NOTE — Telephone Encounter (Signed)
Noted  

## 2022-10-20 ENCOUNTER — Encounter: Payer: Self-pay | Admitting: Family Medicine

## 2022-10-20 ENCOUNTER — Ambulatory Visit: Payer: Medicare HMO | Admitting: Family Medicine

## 2022-10-20 VITALS — BP 160/76 | HR 90 | Ht 59.0 in | Wt 161.0 lb

## 2022-10-20 DIAGNOSIS — L729 Follicular cyst of the skin and subcutaneous tissue, unspecified: Secondary | ICD-10-CM

## 2022-10-20 DIAGNOSIS — R0989 Other specified symptoms and signs involving the circulatory and respiratory systems: Secondary | ICD-10-CM | POA: Diagnosis not present

## 2022-10-20 DIAGNOSIS — F322 Major depressive disorder, single episode, severe without psychotic features: Secondary | ICD-10-CM | POA: Diagnosis not present

## 2022-10-20 DIAGNOSIS — R270 Ataxia, unspecified: Secondary | ICD-10-CM

## 2022-10-20 DIAGNOSIS — E782 Mixed hyperlipidemia: Secondary | ICD-10-CM | POA: Diagnosis not present

## 2022-10-20 DIAGNOSIS — L989 Disorder of the skin and subcutaneous tissue, unspecified: Secondary | ICD-10-CM | POA: Diagnosis not present

## 2022-10-20 MED ORDER — SULFAMETHOXAZOLE-TRIMETHOPRIM 800-160 MG PO TABS: 1.0000 | ORAL_TABLET | Freq: Two times a day (BID) | ORAL | 0 refills | Status: DC

## 2022-10-20 NOTE — Patient Instructions (Addendum)
F/U as before, call if you need me sooner  Septra is prescribed for the cyst on your scalp  You are referred for head scan  PLEASE be careful to fall   Thanks for choosing [facility], we consider it a privelige to serve you.

## 2022-10-20 NOTE — Progress Notes (Unsigned)
   [name]     MRN: [id number]      DOB: 1950/09/28  Chief Complaint  Patient presents with   Fall    Fall 10/18/22 L leg pain and back pain     HPI [name] reportedly has had a fall 2 days ago and the  lady who accompanies he rtoday reports 2 obvious eoppisodes of unsteadiness, and near falls ROS Denies recent fever or chills. Denies sinus pressure, nasal congestion, ear pain or sore throat. Denies chest congestion, productive cough or wheezing. Denies chest pains, palpitations and leg swelling Denies abdominal pain, nausea, vomiting,diarrhea or constipation.   Denies dysuria, frequency, hesitancy or incontinence. Denies joint pain, swelling and limitation in mobility. Denies headaches, seizures, numbness, or tingling. Denies depression, anxiety or insomnia. Denies skin break down or rash.   PE  BP (!) 160/76 (BP Location: Left Arm, Patient Position: Sitting, Cuff Size: Normal)   Pulse 90   Ht 4\' 11"  (1.499 m)   Wt 161 lb (73 kg)   SpO2 94%   BMI 32.52 kg/m   Patient alert and oriented and in no cardiopulmonary distress.  HEENT: No facial asymmetry, EOMI,     Neck supple .  Chest: Clear to auscultation bilaterally.  CVS: S1, S2 no murmurs, no S3.Regular rate.  ABD: Soft non tender.   Ext: No edema  MS: Adequate ROM spine, shoulders, hips and knees.  Skin: Intact, no ulcerations or rash noted.  Psych: Good eye contact, normal affect. Memory intact not anxious or depressed appearing.  CNS: CN 2-12 intact, power,  normal throughout.no focal deficits noted.   Assessment & Plan  No problem-specific Assessment & Plan notes found for this encounter.

## 2022-10-23 ENCOUNTER — Telehealth: Payer: Self-pay | Admitting: Family Medicine

## 2022-10-23 DIAGNOSIS — F319 Bipolar disorder, unspecified: Secondary | ICD-10-CM | POA: Diagnosis not present

## 2022-10-23 DIAGNOSIS — I69354 Hemiplegia and hemiparesis following cerebral infarction affecting left non-dominant side: Secondary | ICD-10-CM | POA: Diagnosis not present

## 2022-10-23 DIAGNOSIS — I69328 Other speech and language deficits following cerebral infarction: Secondary | ICD-10-CM | POA: Diagnosis not present

## 2022-10-23 DIAGNOSIS — E1122 Type 2 diabetes mellitus with diabetic chronic kidney disease: Secondary | ICD-10-CM | POA: Diagnosis not present

## 2022-10-23 DIAGNOSIS — N189 Chronic kidney disease, unspecified: Secondary | ICD-10-CM | POA: Diagnosis not present

## 2022-10-23 DIAGNOSIS — G839 Paralytic syndrome, unspecified: Secondary | ICD-10-CM | POA: Diagnosis not present

## 2022-10-23 DIAGNOSIS — Z471 Aftercare following joint replacement surgery: Secondary | ICD-10-CM | POA: Diagnosis not present

## 2022-10-23 DIAGNOSIS — I129 Hypertensive chronic kidney disease with stage 1 through stage 4 chronic kidney disease, or unspecified chronic kidney disease: Secondary | ICD-10-CM | POA: Diagnosis not present

## 2022-10-23 DIAGNOSIS — D631 Anemia in chronic kidney disease: Secondary | ICD-10-CM | POA: Diagnosis not present

## 2022-10-23 NOTE — Telephone Encounter (Signed)
Patient nurse Wyatt Mage called from [facility] [phone]   Trying to get current medicines straighten out. Received a print out last visit on visit summary and a lot on this list patient says she does not take. Nurse is requesting call back ASAP to discuss correct medications.

## 2022-10-23 NOTE — Telephone Encounter (Signed)
Pt seen in office Friday.

## 2022-10-24 ENCOUNTER — Other Ambulatory Visit: Payer: Self-pay | Admitting: Physician Assistant

## 2022-10-24 ENCOUNTER — Other Ambulatory Visit: Payer: Self-pay | Admitting: Internal Medicine

## 2022-10-24 ENCOUNTER — Other Ambulatory Visit: Payer: Self-pay | Admitting: Family Medicine

## 2022-10-24 ENCOUNTER — Other Ambulatory Visit: Payer: Self-pay | Admitting: Student

## 2022-10-24 DIAGNOSIS — I69328 Other speech and language deficits following cerebral infarction: Secondary | ICD-10-CM | POA: Diagnosis not present

## 2022-10-24 DIAGNOSIS — G839 Paralytic syndrome, unspecified: Secondary | ICD-10-CM | POA: Diagnosis not present

## 2022-10-24 DIAGNOSIS — I69354 Hemiplegia and hemiparesis following cerebral infarction affecting left non-dominant side: Secondary | ICD-10-CM | POA: Diagnosis not present

## 2022-10-24 DIAGNOSIS — F319 Bipolar disorder, unspecified: Secondary | ICD-10-CM | POA: Diagnosis not present

## 2022-10-24 DIAGNOSIS — I129 Hypertensive chronic kidney disease with stage 1 through stage 4 chronic kidney disease, or unspecified chronic kidney disease: Secondary | ICD-10-CM | POA: Diagnosis not present

## 2022-10-24 DIAGNOSIS — E1122 Type 2 diabetes mellitus with diabetic chronic kidney disease: Secondary | ICD-10-CM | POA: Diagnosis not present

## 2022-10-24 DIAGNOSIS — N189 Chronic kidney disease, unspecified: Secondary | ICD-10-CM | POA: Diagnosis not present

## 2022-10-24 DIAGNOSIS — D631 Anemia in chronic kidney disease: Secondary | ICD-10-CM | POA: Diagnosis not present

## 2022-10-24 DIAGNOSIS — Z471 Aftercare following joint replacement surgery: Secondary | ICD-10-CM | POA: Diagnosis not present

## 2022-10-25 ENCOUNTER — Observation Stay (HOSPITAL_COMMUNITY): Admission: RE | Admit: 2022-10-25 | Payer: Medicare HMO | Source: Ambulatory Visit

## 2022-10-25 DIAGNOSIS — L989 Disorder of the skin and subcutaneous tissue, unspecified: Secondary | ICD-10-CM | POA: Insufficient documentation

## 2022-10-25 DIAGNOSIS — I69354 Hemiplegia and hemiparesis following cerebral infarction affecting left non-dominant side: Secondary | ICD-10-CM | POA: Diagnosis not present

## 2022-10-25 DIAGNOSIS — D631 Anemia in chronic kidney disease: Secondary | ICD-10-CM | POA: Diagnosis not present

## 2022-10-25 DIAGNOSIS — E1122 Type 2 diabetes mellitus with diabetic chronic kidney disease: Secondary | ICD-10-CM | POA: Diagnosis not present

## 2022-10-25 DIAGNOSIS — N189 Chronic kidney disease, unspecified: Secondary | ICD-10-CM | POA: Diagnosis not present

## 2022-10-25 DIAGNOSIS — I129 Hypertensive chronic kidney disease with stage 1 through stage 4 chronic kidney disease, or unspecified chronic kidney disease: Secondary | ICD-10-CM | POA: Diagnosis not present

## 2022-10-25 DIAGNOSIS — G839 Paralytic syndrome, unspecified: Secondary | ICD-10-CM | POA: Diagnosis not present

## 2022-10-25 DIAGNOSIS — F319 Bipolar disorder, unspecified: Secondary | ICD-10-CM | POA: Diagnosis not present

## 2022-10-25 DIAGNOSIS — Z471 Aftercare following joint replacement surgery: Secondary | ICD-10-CM | POA: Diagnosis not present

## 2022-10-25 DIAGNOSIS — I69328 Other speech and language deficits following cerebral infarction: Secondary | ICD-10-CM | POA: Diagnosis not present

## 2022-10-25 DIAGNOSIS — R270 Ataxia, unspecified: Secondary | ICD-10-CM | POA: Insufficient documentation

## 2022-10-25 NOTE — Assessment & Plan Note (Signed)
Hyperlipidemia:Low fat diet discussed and encouraged.   Lipid Panel  Lab Results  Component Value Date   CHOL 123 06/29/2022   HDL 54 06/29/2022   LDLCALC 53 06/29/2022   TRIG 81 06/29/2022   CHOLHDL 2.3 06/29/2022     Controlled, no change in medication

## 2022-10-25 NOTE — Assessment & Plan Note (Signed)
Uncontrolled , management is by Nephrology DASH diet and commitment to daily physical activity for a minimum of 30 minutes discussed and encouraged, as a part of hypertension management. The importance of attaining a healthy weight is also discussed.     10/20/2022   11:07 AM 10/20/2022   11:05 AM 10/11/2022   10:24 AM 10/04/2022   10:34 AM 10/04/2022   10:30 AM 08/25/2022    2:08 PM 08/25/2022    4:12 AM  BP/Weight  Systolic BP 160 161 153 172 169 110 138  Diastolic BP 76 62 77 70 67 64 61  Wt. (Lbs)  161 161.04  156    BMI  32.52 kg/m2 32.53 kg/m2  31.51 kg/m2

## 2022-10-25 NOTE — Assessment & Plan Note (Signed)
Septra prescribed for 1 week, derm eval scheduled for January

## 2022-10-25 NOTE — Assessment & Plan Note (Signed)
Unsteady gait with repeated falls , also scalp lesion, head CT  ordered

## 2022-10-25 NOTE — Assessment & Plan Note (Signed)
Managed by Psych reports good control at visit

## 2022-10-26 ENCOUNTER — Emergency Department (HOSPITAL_COMMUNITY): Payer: Medicare HMO

## 2022-10-26 ENCOUNTER — Encounter (HOSPITAL_COMMUNITY): Payer: Self-pay

## 2022-10-26 ENCOUNTER — Emergency Department (HOSPITAL_COMMUNITY)
Admission: EM | Admit: 2022-10-26 | Discharge: 2022-10-26 | Disposition: A | Discharge status: Home / Self Care | Payer: Medicare HMO | Attending: Emergency Medicine | Admitting: Emergency Medicine

## 2022-10-26 ENCOUNTER — Other Ambulatory Visit: Payer: Self-pay

## 2022-10-26 ENCOUNTER — Telehealth: Payer: Self-pay | Admitting: *Deleted

## 2022-10-26 DIAGNOSIS — Z7982 Long term (current) use of aspirin: Secondary | ICD-10-CM | POA: Insufficient documentation

## 2022-10-26 DIAGNOSIS — R519 Headache, unspecified: Secondary | ICD-10-CM | POA: Insufficient documentation

## 2022-10-26 DIAGNOSIS — R42 Dizziness and giddiness: Secondary | ICD-10-CM | POA: Diagnosis not present

## 2022-10-26 DIAGNOSIS — Z794 Long term (current) use of insulin: Secondary | ICD-10-CM | POA: Diagnosis not present

## 2022-10-26 DIAGNOSIS — S0990XA Unspecified injury of head, initial encounter: Secondary | ICD-10-CM | POA: Diagnosis not present

## 2022-10-26 DIAGNOSIS — M542 Cervicalgia: Secondary | ICD-10-CM | POA: Diagnosis not present

## 2022-10-26 DIAGNOSIS — G4489 Other headache syndrome: Secondary | ICD-10-CM | POA: Diagnosis not present

## 2022-10-26 DIAGNOSIS — G9389 Other specified disorders of brain: Secondary | ICD-10-CM | POA: Diagnosis not present

## 2022-10-26 LAB — BASIC METABOLIC PANEL
Anion gap: 10 (ref 5–15)
BUN: 21 mg/dL (ref 8–23)
CO2: 22 mmol/L (ref 22–32)
Calcium: 9.1 mg/dL (ref 8.9–10.3)
Chloride: 108 mmol/L (ref 98–111)
Creatinine, Ser: 1.01 mg/dL — ABNORMAL HIGH (ref 0.44–1.00)
GFR, Estimated: 59 mL/min — ABNORMAL LOW (ref >60)
Glucose, Bld: 113 mg/dL — ABNORMAL HIGH (ref 70–99)
Potassium: 3.9 mmol/L (ref 3.5–5.1)
Sodium: 140 mmol/L (ref 135–145)

## 2022-10-26 LAB — CBC
HCT: 32.9 % — ABNORMAL LOW (ref 36.0–46.0)
Hemoglobin: 9.5 g/dL — ABNORMAL LOW (ref 12.0–15.0)
MCH: 23.1 pg — ABNORMAL LOW (ref 26.0–34.0)
MCHC: 28.9 g/dL — ABNORMAL LOW (ref 30.0–36.0)
MCV: 80 fL (ref 80.0–100.0)
Platelets: 270 10*3/uL (ref 150–400)
RBC: 4.11 MIL/uL (ref 3.87–5.11)
RDW: 15.5 % (ref 11.5–15.5)
WBC: 6.7 10*3/uL (ref 4.0–10.5)
nRBC: 0 % (ref 0.0–0.2)

## 2022-10-26 MED ORDER — IBUPROFEN 400 MG PO TABS
400.0000 mg | ORAL_TABLET | Freq: Once | ORAL | Status: DC
  Filled 2022-10-26: qty 1

## 2022-10-26 MED ORDER — OXYCODONE-ACETAMINOPHEN 5-325 MG PO TABS
1.0000 | ORAL_TABLET | Freq: Once | ORAL | Status: AC
  Administered 2022-10-26: 1 via ORAL
  Filled 2022-10-26: qty 1

## 2022-10-26 NOTE — ED Provider Notes (Signed)
[place] EMERGENCY DEPARTMENT AT [facility] Provider Note   CSN: [id number] Arrival date & time: 10/26/22  0200     History  Chief Complaint  Patient presents with   Headache    [name] is a 72 y.o. female.  Patient presents to the emergency department for evaluation of headache.  Patient reports that she had a fall a couple of weeks ago and has had a persistent headache since.  She does have a history of recurrent headaches, occasionally severe headaches.  She has never been diagnosed with migraines.       Home Medications Prior to Admission medications   Medication Sig Start Date End Date Taking? Authorizing Provider  ACCU-CHEK GUIDE test strip USE TO CHECK BLOOD SUGAR FOUR TIMES A DAY AND AS NEEDED (NEED MD APPOINTMENT FOR ANY FURTHER REFILLS) 05/10/22   Carlus Pavlov, MD  Accu-Chek Softclix Lancets lancets TEST BLOOD SUGAR THREE TIMES DAILY AS DIRECTED 03/28/21   Carlus Pavlov, MD  acetaminophen (TYLENOL) 325 MG tablet Take 650 mg by mouth every 6 (six) hours as needed for mild pain, moderate pain or headache.    [provider]  Alcohol Swabs (DROPSAFE ALCOHOL PREP) 70 % PADS USE TO CLEAN FINGER PRIOR TO TESTING FOR BLOOD SUGAR AS DIRECTED 05/10/22   Carlus Pavlov, MD  alendronate (FOSAMAX) 70 MG tablet TAKE 1 TABLET EVERY 7 DAYS ON AN EMPTY STOMACH WITH A FULL GLASS OF WATER 11/07/21   Kerri Perches, MD  amLODipine (NORVASC) 10 MG tablet TAKE ONE TABLET BY MOUTH ONCE DAILY. 10/24/22   Kerri Perches, MD  aspirin EC 81 MG tablet Take 1 tablet (81 mg total) by mouth daily with breakfast. 12/08/18   Shon Hale, MD  Blood Glucose Calibration (ACCU-CHEK GUIDE CONTROL) LIQD USE AS DIRECTED 12/13/20   Carlus Pavlov, MD  blood glucose meter kit and supplies Dispense based on patient and insurance preference. Use up to four times daily as directed. (FOR ICD-10 E10.9, E11.9). 06/27/19   Carlus Pavlov, MD  buPROPion (WELLBUTRIN XL)  150 MG 24 hr tablet Take 1 tablet (150 mg total) by mouth every morning. 10/04/22 10/04/23  Myrlene Broker, MD  Calcium Carb-Cholecalciferol (CALTRATE 600+D3 SOFT) 600-20 MG-MCG CHEW Chew 1 tablet by mouth daily.    [provider]  carvedilol (COREG) 12.5 MG tablet Take 12.5 mg by mouth 2 (two) times daily with a meal.    [provider]  Cholecalciferol (D3 PO) Take 1 tablet by mouth daily.    [provider]  clobetasol (TEMOVATE) 0.05 % external solution Apply 1 Application topically 2 (two) times daily. Patient taking differently: Apply 1 Application topically See admin instructions. Apply to affected area(s) 2 times a day 06/28/22   Kerri Perches, MD  Continuous Blood Gluc Sensor (FREESTYLE LIBRE 14 DAY SENSOR) MISC 1 each by Does not apply route every 14 (fourteen) days. Change every 2 weeks Patient taking differently: Inject 1 Device into the skin every 14 (fourteen) days. 06/24/19   Carlus Pavlov, MD  Docusate Sodium (DSS) 100 MG CAPS Take 100 mg by mouth in the morning and at bedtime.    [provider]  DROPLET PEN NEEDLES 31G X 8 MM MISC USE FOR INJECTING INSULIN 4 TIMES DAILY. 09/02/21   Carlus Pavlov, MD  Elastic Bandages & Supports (ADJUSTABLE ARM SLING) MISC L arm sling 05/04/22   Kerri Perches, MD  ezetimibe (ZETIA) 10 MG tablet TAKE 1 TABLET EVERY DAY 01/19/22  Kerri Perches, MD  hydrochlorothiazide (MICROZIDE) 12.5 MG capsule Take 12.5 mg by mouth daily. 09/27/22   [provider]  insulin aspart (NOVOLOG FLEXPEN) 100 UNIT/ML FlexPen Inject 5 Units into the skin 3 (three) times daily with meals. Inject 5 units into the skin three times a day with meals and use only if 50% or more of a meal is eaten 10/03/22   Carlus Pavlov, MD  insulin glargine, 2 Unit Dial, (TOUJEO MAX SOLOSTAR) 300 UNIT/ML Solostar Pen Inject 20-26 Units into the skin at bedtime. 10/03/22   Carlus Pavlov, MD  ipratropium (ATROVENT) 0.03 % nasal  spray Place 2 sprays into both nostrils every 12 (twelve) hours. 01/16/22   Glenford Bayley, NP  ketoconazole (NIZORAL) 2 % shampoo Apply 1 Application topically 2 (two) times a week. 10/16/22   Kerri Perches, MD  lamoTRIgine (LAMICTAL) 100 MG tablet Take 1 tablet (100 mg total) by mouth 2 (two) times daily. 10/04/22   Myrlene Broker, MD  levothyroxine (SYNTHROID) 50 MCG tablet Take 1 tablet (50 mcg total) by mouth See admin instructions. Take 25 mcg by mouth before breakfast on Sunday and 50 mcg on Mon/Tues/Wed/Thurs/Fri/Sat 10/03/22   Carlus Pavlov, MD  lidocaine (HM LIDOCAINE PATCH) 4 % Place 1 patch onto the skin daily. 10/04/22   Del Nigel Berthold, FNP  LORazepam (ATIVAN) 0.5 MG tablet Take 1 tablet (0.5 mg total) by mouth 2 (two) times daily. 10/04/22 10/04/23  Myrlene Broker, MD  metoprolol tartrate (LOPRESSOR) 50 MG tablet TAKE 1 TABLET TWICE DAILY (NEED MD APPOINTMENT) 10/24/22   Quintella Reichert, MD  Misc. Devices (MATTRESS PAD) MISC FIRM MATTRESS PAD for hospital bed x 1 02/22/21   Kerri Perches, MD  Multiple Vitamins-Minerals (MULTIVITAMIN GUMMIES ADULT PO) Take 1 tablet by mouth daily.    [provider]  MYRBETRIQ 25 MG TB24 tablet TAKE 1 TABLET EVERY DAY 05/10/22   Kerri Perches, MD  nicotine (NICODERM CQ) 21 mg/24hr patch Place 1 patch (21 mg total) onto the skin daily. 06/20/22   Waymon Budge, MD  ondansetron (ZOFRAN) 4 MG tablet Take 1 tablet (4 mg total) by mouth every 8 (eight) hours as needed for nausea or vomiting. 10/04/22   Del Newman Nip, Tenna Child, FNP  potassium chloride (KLOR-CON) 10 MEQ tablet Take 1 tablet (10 mEq total) by mouth 2 (two) times daily. 02/01/22   Kerri Perches, MD  pregabalin (LYRICA) 75 MG capsule Take 75 mg by mouth daily.    [provider]  RABEprazole (ACIPHEX) 20 MG tablet TAKE 1 TABLET TWICE DAILY 11/09/21   Pyrtle, Carie Caddy, MD  RESTASIS 0.05 % ophthalmic emulsion Place 2 drops into both eyes daily. 07/29/20    [provider]  risperiDONE (RISPERDAL) 0.5 MG tablet Take 0.5 mg by mouth at bedtime.    [provider]  rosuvastatin (CRESTOR) 5 MG tablet TAKE 1 TABLET AT BEDTIME 06/12/22   Kerri Perches, MD  sertraline (ZOLOFT) 100 MG tablet Take 1 tablet (100 mg total) by mouth every morning. 10/04/22   Myrlene Broker, MD  spironolactone (ALDACTONE) 50 MG tablet TAKE 1 TABLET (50 MG TOTAL) BY MOUTH DAILY. DISCONTINUE SPIRONOLACTONE 25MG  04/18/22   Kerri Perches, MD  sulfamethoxazole-trimethoprim (BACTRIM DS) 800-160 MG tablet Take 1 tablet by mouth 2 (two) times daily. 10/20/22   Kerri Perches, MD  telmisartan (MICARDIS) 20 MG tablet Take 10 mg by mouth daily. 11/23/21   [provider]  traZODone (DESYREL) 150 MG tablet Take 1 tablet (150 mg total) by mouth at bedtime. 10/04/22   Myrlene Broker, MD  umeclidinium-vilanterol [facility] ELLIPTA) 62.5-25 MCG/ACT AEPB Inhale 1 puff into the lungs daily. 06/20/22   Waymon Budge, MD  UNABLE TO FIND Med Name:  G5 Mattress 07/31/22   Kerri Perches, MD  vitamin E 180 MG (400 UNITS) capsule Take 400 Units by mouth daily.    [provider]      Allergies    Iron, Milk (cow), Penicillins, Phenazopyridine, Cephalexin, Flonase [fluticasone], and Milk-related compounds    Review of Systems   Review of Systems  Physical Exam Updated Vital Signs BP (!) 145/52 (BP Location: Left Arm)   Pulse 84   Temp 99.4 F (37.4 C) (Oral)   Resp 18   Ht 4\' 11"  (1.499 m)   Wt 73 kg   SpO2 98%   BMI 32.52 kg/m  Physical Exam Vitals and nursing note reviewed.  Constitutional:      General: She is not in acute distress.    Appearance: She is well-developed.  HENT:     Head: Normocephalic and atraumatic.     Mouth/Throat:     Mouth: Mucous membranes are moist.  Eyes:     General: Vision grossly intact. Gaze aligned appropriately.     Extraocular Movements: Extraocular movements intact.     Conjunctiva/sclera:  Conjunctivae normal.  Cardiovascular:     Rate and Rhythm: Normal rate and regular rhythm.     Pulses: Normal pulses.     Heart sounds: Normal heart sounds, S1 normal and S2 normal. No murmur heard.    No friction rub. No gallop.  Pulmonary:     Effort: Pulmonary effort is normal. No respiratory distress.     Breath sounds: Normal breath sounds.  Abdominal:     General: Bowel sounds are normal.     Palpations: Abdomen is soft.     Tenderness: There is no abdominal tenderness. There is no guarding or rebound.     Hernia: No hernia is present.  Musculoskeletal:        General: No swelling.     Cervical back: Full passive range of motion without pain, normal range of motion and neck supple. No spinous process tenderness or muscular tenderness. Normal range of motion.     Right lower leg: No edema.     Left lower leg: No edema.  Skin:    General: Skin is warm and dry.     Capillary Refill: Capillary refill takes less than 2 seconds.     Findings: No ecchymosis, erythema, rash or wound.  Neurological:     General: No focal deficit present.     Mental Status: She is alert and oriented to person, place, and time.     GCS: GCS eye subscore is 4. GCS verbal subscore is 5. GCS motor subscore is 6.     Cranial Nerves: Cranial nerves 2-12 are intact.     Sensory: Sensation is intact.     Motor: Motor function is intact.     Coordination: Coordination is intact.  Psychiatric:        Attention and Perception: Attention normal.        Mood and Affect: Mood normal.        Speech: Speech normal.        Behavior: Behavior normal.     ED Results / Procedures / Treatments   Labs (all labs ordered are listed, but  only abnormal results are displayed) Labs Reviewed  CBC - Abnormal; Notable for the following components:      Result Value   Hemoglobin 9.5 (*)    HCT 32.9 (*)    MCH 23.1 (*)    MCHC 28.9 (*)    All other components within normal limits  BASIC METABOLIC PANEL - Abnormal;  Notable for the following components:   Glucose, Bld 113 (*)    Creatinine, Ser 1.01 (*)    GFR, Estimated 59 (*)    All other components within normal limits    EKG None  Radiology CT CERVICAL SPINE WO CONTRAST  Result Date: 10/26/2022 CLINICAL DATA:  Fall a few weeks ago with persistent headaches and neck pain, initial encounter EXAM: CT HEAD WITHOUT CONTRAST CT CERVICAL SPINE WITHOUT CONTRAST TECHNIQUE: Multidetector CT imaging of the head and cervical spine was performed following the standard protocol without intravenous contrast. Multiplanar CT image reconstructions of the cervical spine were also generated. RADIATION DOSE REDUCTION: This exam was performed according to the departmental dose-optimization program which includes automated exposure control, adjustment of the mA and/or kV according to patient size and/or use of iterative reconstruction technique. COMPARISON:  11/09/2021 FINDINGS: CT HEAD FINDINGS Brain: There are encephalomalacia changes in the inferior frontal lobes bilaterally consistent with prior infarct. The overall appearance is stable from the prior study. No acute hemorrhage, acute infarction or space-occupying mass lesion is noted. Vascular: No hyperdense vessel or unexpected calcification. Skull: Changes of prior frontal craniotomy are noted and stable. Sinuses/Orbits: No acute finding. Other: None. CT CERVICAL SPINE FINDINGS Alignment: Within normal limits. Skull base and vertebrae: 7 cervical segments are well visualized. Vertebral body height is well maintained. Multilevel osteophytic change and facet hypertrophic change is noted. No acute fracture or acute facet abnormality is noted. Soft tissues and spinal canal: Surrounding soft tissue structures are within normal limits. Upper chest: Visualized lung apices are unremarkable. Other: None IMPRESSION: CT of the head: Chronic bifrontal encephalomalacia stable from the prior exam. No acute abnormality noted. CT of cervical  spine: Multilevel degenerative change without evidence of acute abnormality. Electronically Signed   By: Alcide Clever M.D.   On: 10/26/2022 03:02   CT HEAD WO CONTRAST ( )  Result Date: 10/26/2022 CLINICAL DATA:  Fall a few weeks ago with persistent headaches and neck pain, initial encounter EXAM: CT HEAD WITHOUT CONTRAST CT CERVICAL SPINE WITHOUT CONTRAST TECHNIQUE: Multidetector CT imaging of the head and cervical spine was performed following the standard protocol without intravenous contrast. Multiplanar CT image reconstructions of the cervical spine were also generated. RADIATION DOSE REDUCTION: This exam was performed according to the departmental dose-optimization program which includes automated exposure control, adjustment of the mA and/or kV according to patient size and/or use of iterative reconstruction technique. COMPARISON:  11/09/2021 FINDINGS: CT HEAD FINDINGS Brain: There are encephalomalacia changes in the inferior frontal lobes bilaterally consistent with prior infarct. The overall appearance is stable from the prior study. No acute hemorrhage, acute infarction or space-occupying mass lesion is noted. Vascular: No hyperdense vessel or unexpected calcification. Skull: Changes of prior frontal craniotomy are noted and stable. Sinuses/Orbits: No acute finding. Other: None. CT CERVICAL SPINE FINDINGS Alignment: Within normal limits. Skull base and vertebrae: 7 cervical segments are well visualized. Vertebral body height is well maintained. Multilevel osteophytic change and facet hypertrophic change is noted. No acute fracture or acute facet abnormality is noted. Soft tissues and spinal canal: Surrounding soft tissue structures are within normal limits. Upper chest: Visualized lung  apices are unremarkable. Other: None IMPRESSION: CT of the head: Chronic bifrontal encephalomalacia stable from the prior exam. No acute abnormality noted. CT of cervical spine: Multilevel degenerative change without  evidence of acute abnormality. Electronically Signed   By: Alcide Clever M.D.   On: 10/26/2022 03:02    Procedures Procedures    Medications Ordered in ED Medications  oxyCODONE-acetaminophen (PERCOCET/ROXICET) 5-325 MG per tablet 1 tablet (has no administration in time range)  ibuprofen (ADVIL) tablet 400 mg (has no administration in time range)    ED Course/ Medical Decision Making/ A&P                                 Medical Decision Making Amount and/or Complexity of Data Reviewed Labs: ordered. Radiology: ordered.  Risk Prescription drug management.   Differential diagnosis considered includes, but not limited to: intracranial hemorrhage; concussion; postconcussive syndrome; migraine headache.  Presents with headache after a fall.  Patient reports that the fall occurred a couple of weeks ago.  She has had persistent headaches since.  She does, however, reports that she gets frequent headaches and sometimes they are quite severe.  She has no neurologic findings on exam.  CT head and cervical spine performed.  No acute findings.        Final Clinical Impression(s) / ED Diagnoses Final diagnoses:  Bad headache    Rx / DC Orders ED Discharge Orders     None         , Canary Brim, MD 10/26/22 [phone]

## 2022-10-26 NOTE — ED Notes (Signed)
Pt's sister in law Consuella Lose agrees to pick patient up and take her home. Consuella Lose states " I don't want to have to wait". Pt was DC to lobby via wheelchair in stable condition. She is alert and oriented. Wardell Heath Gerrianne Scale

## 2022-10-26 NOTE — Transitions of Care (Post Inpatient/ED Visit) (Signed)
   10/26/2022  Name: [name] MRN: [id number] DOB: [birth year]  Today's TOC FU Call Status: Today's TOC FU Call Status:: Unsuccessful Call (1st Attempt) Unsuccessful Call (1st Attempt) Date: 10/26/22  Attempted to reach the patient regarding the most recent Inpatient/ED visit.  Follow Up Plan: Additional outreach attempts will be made to reach the patient to complete the Transitions of Care (Post Inpatient/ED visit) call.   Irving Shows [facility], BSN [place]/ Ambulatory Care Management [phone]

## 2022-10-26 NOTE — ED Notes (Signed)
Patient transported to CT 

## 2022-10-26 NOTE — ED Triage Notes (Signed)
Pt fell a few weeks ago & has had a headache since.  Was supposed to have outpt CT scan of her head but had the wrong date so she missed it.

## 2022-10-27 ENCOUNTER — Telehealth: Payer: Self-pay | Admitting: *Deleted

## 2022-10-27 NOTE — Transitions of Care (Post Inpatient/ED Visit) (Signed)
   10/27/2022  Name: [name] MRN: [id number] DOB: [birth year]  Today's TOC FU Call Status: Today's TOC FU Call Status:: Unsuccessful Call (2nd Attempt) Unsuccessful Call (2nd Attempt) Date: 10/27/22  Attempted to reach the patient regarding the most recent Inpatient/ED visit.  Follow Up Plan: Additional outreach attempts will be made to reach the patient to complete the Transitions of Care (Post Inpatient/ED visit) call.   Irving Shows [facility], BSN [place]/ Ambulatory Care Management [phone]

## 2022-10-30 ENCOUNTER — Telehealth: Payer: Self-pay | Admitting: Family Medicine

## 2022-10-30 ENCOUNTER — Other Ambulatory Visit: Payer: Self-pay | Admitting: *Deleted

## 2022-10-30 ENCOUNTER — Encounter: Payer: Self-pay | Admitting: *Deleted

## 2022-10-30 DIAGNOSIS — N189 Chronic kidney disease, unspecified: Secondary | ICD-10-CM | POA: Diagnosis not present

## 2022-10-30 DIAGNOSIS — G839 Paralytic syndrome, unspecified: Secondary | ICD-10-CM | POA: Diagnosis not present

## 2022-10-30 DIAGNOSIS — E1122 Type 2 diabetes mellitus with diabetic chronic kidney disease: Secondary | ICD-10-CM | POA: Diagnosis not present

## 2022-10-30 DIAGNOSIS — I129 Hypertensive chronic kidney disease with stage 1 through stage 4 chronic kidney disease, or unspecified chronic kidney disease: Secondary | ICD-10-CM | POA: Diagnosis not present

## 2022-10-30 DIAGNOSIS — I69328 Other speech and language deficits following cerebral infarction: Secondary | ICD-10-CM | POA: Diagnosis not present

## 2022-10-30 DIAGNOSIS — Z471 Aftercare following joint replacement surgery: Secondary | ICD-10-CM | POA: Diagnosis not present

## 2022-10-30 DIAGNOSIS — I69354 Hemiplegia and hemiparesis following cerebral infarction affecting left non-dominant side: Secondary | ICD-10-CM | POA: Diagnosis not present

## 2022-10-30 DIAGNOSIS — D631 Anemia in chronic kidney disease: Secondary | ICD-10-CM | POA: Diagnosis not present

## 2022-10-30 DIAGNOSIS — F319 Bipolar disorder, unspecified: Secondary | ICD-10-CM | POA: Diagnosis not present

## 2022-10-30 NOTE — Telephone Encounter (Signed)
Patient called saying her [employer] needs a letter stating that Dr Lodema Hong is her only pcp provider and history of seeing this patient. Patient ask for a call back to give all detail information to the nurse. Needs ASAP, patient does not have no name or contact number for [employer]. Patient will come by office and drop off the POA papers.

## 2022-10-30 NOTE — Transitions of Care (Post Inpatient/ED Visit) (Signed)
10/30/2022  Name: [name] MRN: [id number] DOB: 1950-05-22  Today's TOC FU Call Status: Today's TOC FU Call Status:: Successful TOC FU Call Completed TOC FU Call Complete Date: 10/30/22  Transition Care Management Follow-up Telephone Call Date of Discharge: 10/26/22 [name] Type of Discharge: Emergency Department Reason for ED Visit: Other: (headache) How have you been since you were released from the hospital?: Better Any questions or concerns?: No  Items Reviewed: Did you receive and understand the discharge instructions provided?: Yes Medications obtained,verified, and reconciled?: Yes (Medications Reviewed) Any new allergies since your discharge?: No Dietary orders reviewed?: Yes Type of Diet Ordered:: heart healthy, carbohyhdrate modified Do you have support at home?: Yes People in Home: alone Name of Support/Comfort Primary Source: has CAP services, CNA assists with ADL, IADL's, transportation  Medications Reviewed Today: Medications Reviewed Today     Reviewed by Audrie Gallus, RN (Registered Nurse) on 10/30/22 at 1110  Med List Status: <None>   Medication Order Taking? Sig Documenting Provider Last Dose Status Informant  ACCU-CHEK GUIDE test strip [id number] Yes USE TO CHECK BLOOD SUGAR FOUR TIMES A DAY AND AS NEEDED (NEED MD APPOINTMENT FOR ANY FURTHER REFILLS) Carlus Pavlov, MD Taking Active Self  Accu-Chek Softclix Lancets lancets [id number] Yes TEST BLOOD SUGAR THREE TIMES DAILY AS DIRECTED Carlus Pavlov, MD Taking Active Self  acetaminophen (TYLENOL) 325 MG tablet [id number] Yes Take 650 mg by mouth every 6 (six) hours as needed for mild pain, moderate pain or headache. [provider] Taking Active Self  albuterol (VENTOLIN HFA) 108 (90 Base) MCG/ACT inhaler [id number] Yes INHALE 1 TO 2 PUFFS EVERY 6 HOURS AS NEEDED FOR WHEEZING, SHORTNESS OF Sharon Mt, MD Taking Active   Alcohol Swabs (DROPSAFE ALCOHOL  PREP) 70 % PADS [id number] Yes USE TO CLEAN FINGER PRIOR TO TESTING FOR BLOOD SUGAR AS DIRECTED Carlus Pavlov, MD Taking Active Self  alendronate (FOSAMAX) 70 MG tablet [id number] Yes TAKE 1 TABLET EVERY 7 DAYS ON AN EMPTY STOMACH WITH A FULL GLASS OF WATER Kerri Perches, MD Taking Active Self           Med Note Antony Madura, Arn Medal   Tue Aug 08, 2022 10:06 PM) Preferred day of the week not stated  amLODipine (NORVASC) 10 MG tablet [id number] Yes TAKE ONE TABLET BY MOUTH ONCE DAILY. Kerri Perches, MD Taking Active   aspirin EC 81 MG tablet [id number] Yes Take 1 tablet (81 mg total) by mouth daily with breakfast. Shon Hale, MD Taking Active Self  Blood Glucose Calibration (ACCU-CHEK GUIDE CONTROL) Anise Salvo [id number] Yes USE AS DIRECTED Carlus Pavlov, MD Taking Active Self  blood glucose meter kit and supplies [id number] Yes Dispense based on patient and insurance preference. Use up to four times daily as directed. (FOR ICD-10 E10.9, E11.9). Carlus Pavlov, MD Taking Active Self  buPROPion (WELLBUTRIN XL) 150 MG 24 hr tablet [id number] Yes Take 1 tablet (150 mg total) by mouth every morning. Myrlene Broker, MD Taking Active   Calcium Carb-Cholecalciferol (CALTRATE 600+D3 SOFT) 600-20 MG-MCG CHEW [id number] Yes Chew 1 tablet by mouth daily. [provider] Taking Active Self  carvedilol (COREG) 12.5 MG tablet [id number] Yes Take 12.5 mg by mouth 2 (two) times daily with a meal. [provider] Taking Active   Cholecalciferol (D3 PO) [id number] Yes Take 1 tablet by mouth daily. [provider] Taking Active Self           Med Note (Antony Madura, Arn Medal  Tue Aug 08, 2022 10:11 PM) Strength not noted  clobetasol (TEMOVATE) 0.05 % external solution [id number] Yes Apply 1 Application topically 2 (two) times daily.  Patient taking differently: Apply 1 Application topically See admin instructions. Apply to affected area(s) 2 times a day   Kerri Perches, MD Taking Active  Self  Continuous Blood Gluc Sensor (FREESTYLE LIBRE 14 DAY SENSOR) [place] [id number] No 1 each by Does not apply route every 14 (fourteen) days. Change every 2 weeks  Patient not taking: Reported on 10/30/2022   Carlus Pavlov, MD Not Taking Active Self  Docusate Sodium (DSS) 100 MG CAPS [id number] Yes Take 100 mg by mouth in the morning and at bedtime. [provider] Taking Active Self  DROPLET PEN NEEDLES 31G X 8 MM MISC [id number] Yes USE FOR INJECTING INSULIN 4 TIMES DAILY. Carlus Pavlov, MD Taking Active Self  Elastic Bandages & Supports (ADJUSTABLE ARM [place]) [place] [id number] Yes L arm sling Kerri Perches, MD Taking Active Self  ezetimibe (ZETIA) 10 MG tablet [id number] Yes TAKE 1 TABLET EVERY DAY Kerri Perches, MD Taking Active Self  hydrochlorothiazide (MICROZIDE) 12.5 MG capsule [id number] Yes Take 12.5 mg by mouth daily. [provider] Taking Active   insulin aspart (NOVOLOG FLEXPEN) 100 UNIT/ML FlexPen [id number] Yes Inject 5 Units into the skin 3 (three) times daily with meals. Inject 5 units into the skin three times a day with meals and use only if 50% or more of a meal is eaten Carlus Pavlov, MD Taking Active   insulin glargine, 2 Unit Dial, (TOUJEO MAX SOLOSTAR) 300 UNIT/ML Solostar Pen [id number] Yes Inject 20-26 Units into the skin at bedtime. Carlus Pavlov, MD Taking Active   ipratropium (ATROVENT) 0.03 % nasal spray [id number] Yes Place 2 sprays into both nostrils every 12 (twelve) hours. Glenford Bayley, NP Taking Active Self  ketoconazole (NIZORAL) 2 % shampoo [id number] Yes Apply 1 Application topically 2 (two) times a week. Kerri Perches, MD Taking Active   lamoTRIgine (LAMICTAL) 100 MG tablet [id number] Yes Take 1 tablet (100 mg total) by mouth 2 (two) times daily. Myrlene Broker, MD Taking Active   levothyroxine (SYNTHROID) 50 MCG tablet [id number] Yes Take 1 tablet (50 mcg total) by mouth See admin instructions. Take 25 mcg by mouth  before breakfast on Sunday and 50 mcg on Mon/Tues/Wed/Thurs/Fri/Sat Carlus Pavlov, MD Taking Active   lidocaine (HM LIDOCAINE PATCH) 4 % [id number] Yes Place 1 patch onto the skin daily. Del Newman Nip, Tenna Child, FNP Taking Active   LORazepam (ATIVAN) 0.5 MG tablet [id number] Yes Take 1 tablet (0.5 mg total) by mouth 2 (two) times daily. Myrlene Broker, MD Taking Active   metoprolol tartrate (LOPRESSOR) 50 MG tablet [id number] Yes TAKE 1 TABLET TWICE DAILY (NEED MD APPOINTMENT) Quintella Reichert, MD Taking Active   Misc. Devices (MATTRESS PAD) MISC [id number] Yes FIRM MATTRESS PAD for hospital bed x 1 Kerri Perches, MD Taking Active Self  Multiple Vitamins-Minerals (MULTIVITAMIN GUMMIES ADULT PO) [id number] Yes Take 1 tablet by mouth daily. [provider] Taking Active Self  MYRBETRIQ 25 MG TB24 tablet [id number] Yes TAKE 1 TABLET EVERY DAY Kerri Perches, MD Taking Active Self  nicotine (NICODERM CQ) 21 mg/24hr patch [id number] No Place 1 patch (21 mg total) onto the skin daily.  Patient not taking: Reported on 10/30/2022   Waymon Budge, MD Not Taking Active Self  ondansetron [facility]) 4 MG tablet [id number] Yes Take 1 tablet (4 mg total) by mouth every  8 (eight) hours as needed for nausea or vomiting. Del Newman Nip, Tenna Child, FNP Taking Active   potassium chloride (KLOR-CON) 10 MEQ tablet [id number] Yes Take 1 tablet (10 mEq total) by mouth 2 (two) times daily. Kerri Perches, MD Taking Active Self  pregabalin (LYRICA) 75 MG capsule [id number] Yes Take 75 mg by mouth daily. [provider] Taking Active   RABEprazole (ACIPHEX) 20 MG tablet [id number] Yes TAKE 1 TABLET TWICE DAILY Pyrtle, Carie Caddy, MD Taking Active Self  RESTASIS 0.05 % ophthalmic emulsion [id number] Yes Place 2 drops into both eyes daily. [provider] Taking Active Self           Med Note Sharlene Dory   Fri Sep 23, 2021  8:17 AM)    risperiDONE (RISPERDAL) 0.5 MG tablet [id number] Yes  Take 0.5 mg by mouth at bedtime. [provider] Taking Active   rosuvastatin (CRESTOR) 5 MG tablet [id number] Yes TAKE 1 TABLET AT BEDTIME Kerri Perches, MD Taking Active Self  sertraline (ZOLOFT) 100 MG tablet [id number] Yes Take 1 tablet (100 mg total) by mouth every morning. Myrlene Broker, MD Taking Active   spironolactone (ALDACTONE) 50 MG tablet [id number] Yes TAKE 1 TABLET (50 MG TOTAL) BY MOUTH DAILY. DISCONTINUE SPIRONOLACTONE 25MG  Kerri Perches, MD Taking Active Self  sulfamethoxazole-trimethoprim (BACTRIM DS) 800-160 MG tablet [id number] Yes Take 1 tablet by mouth 2 (two) times daily. Kerri Perches, MD Taking Active   telmisartan (MICARDIS) 20 MG tablet [id number] Yes Take 10 mg by mouth daily. [provider] Taking Active Self  traZODone (DESYREL) 150 MG tablet [id number] Yes Take 1 tablet (150 mg total) by mouth at bedtime. Myrlene Broker, MD Taking Active   umeclidinium-vilanterol [facility] ELLIPTA) 62.5-25 MCG/ACT AEPB [id number] Yes Inhale 1 puff into the lungs daily. Waymon Budge, MD Taking Active Self  UNABLE TO FIND [id number] Yes Med Name:  G5 Mattress Kerri Perches, MD Taking Active Self  vitamin E 180 MG (400 UNITS) capsule [id number] Yes Take 400 Units by mouth daily. [provider] Taking Active Self  Med List Note Rolene Course 04/10/17 1696): Medications have been confirmed via pharmacy records \\[facility] All Medications [phone]            Home Care and Equipment/Supplies: Were Home Health Services Ordered?: No Any new equipment or medical supplies ordered?: No  Functional Questionnaire: Do you need assistance with bathing/showering or dressing?: Yes (aide assists) Do you need assistance with meal preparation?: Yes (aide assists) Do you need assistance with eating?: No Do you have difficulty maintaining continence: No Do you need assistance with getting out of bed/getting  out of a chair/moving?: Yes (has walker) Do you have difficulty managing or taking your medications?: Yes (aide provides oversight)  Follow up appointments reviewed: PCP Follow-up appointment confirmed?: Yes Date of PCP follow-up appointment?: 11/01/22 Follow-up Provider: Dr. Lodema Hong  @ 920 am, pt verbalizes understanding Specialist Hospital Follow-up appointment confirmed?: Yes Date of Specialist follow-up appointment?: 11/03/22 Follow-Up Specialty Provider:: Dr. Elvera Lennox  @[phone]  am Do you need transportation to your follow-up appointment?: No (aide assists with transportation, also uses public transportation) Do you understand care options if your condition(s) worsen?: Yes-patient verbalized understanding  SDOH Interventions Today    Flowsheet Row Most Recent Value  SDOH Interventions   Food Insecurity Interventions Intervention Not Indicated  Transportation Interventions Intervention Not Indicated  Utilities Interventions Intervention Not Indicated       Irving Shows  RNC, BSN [facility]/ Ambulatory Care Management [phone]

## 2022-10-31 DIAGNOSIS — I69328 Other speech and language deficits following cerebral infarction: Secondary | ICD-10-CM | POA: Diagnosis not present

## 2022-10-31 DIAGNOSIS — G839 Paralytic syndrome, unspecified: Secondary | ICD-10-CM | POA: Diagnosis not present

## 2022-10-31 DIAGNOSIS — I69354 Hemiplegia and hemiparesis following cerebral infarction affecting left non-dominant side: Secondary | ICD-10-CM | POA: Diagnosis not present

## 2022-10-31 DIAGNOSIS — N189 Chronic kidney disease, unspecified: Secondary | ICD-10-CM | POA: Diagnosis not present

## 2022-10-31 DIAGNOSIS — E1122 Type 2 diabetes mellitus with diabetic chronic kidney disease: Secondary | ICD-10-CM | POA: Diagnosis not present

## 2022-10-31 DIAGNOSIS — I129 Hypertensive chronic kidney disease with stage 1 through stage 4 chronic kidney disease, or unspecified chronic kidney disease: Secondary | ICD-10-CM | POA: Diagnosis not present

## 2022-10-31 DIAGNOSIS — F319 Bipolar disorder, unspecified: Secondary | ICD-10-CM | POA: Diagnosis not present

## 2022-10-31 DIAGNOSIS — D631 Anemia in chronic kidney disease: Secondary | ICD-10-CM | POA: Diagnosis not present

## 2022-10-31 DIAGNOSIS — Z471 Aftercare following joint replacement surgery: Secondary | ICD-10-CM | POA: Diagnosis not present

## 2022-11-01 ENCOUNTER — Telehealth: Payer: Self-pay | Admitting: Family Medicine

## 2022-11-01 ENCOUNTER — Ambulatory Visit: Payer: Medicare HMO | Admitting: Family Medicine

## 2022-11-01 NOTE — Telephone Encounter (Signed)
Note written

## 2022-11-01 NOTE — Telephone Encounter (Signed)
Patient wants a call back in regard to /discuss getting letter for [facility] saying still her pcp / to bring POA papers. Proof that she is coming in to doctors office

## 2022-11-01 NOTE — Telephone Encounter (Signed)
Note provided

## 2022-11-03 ENCOUNTER — Ambulatory Visit (INDEPENDENT_AMBULATORY_CARE_PROVIDER_SITE_OTHER): Payer: Medicare HMO | Admitting: Internal Medicine

## 2022-11-03 ENCOUNTER — Encounter: Payer: Self-pay | Admitting: Internal Medicine

## 2022-11-03 VITALS — BP 146/70 | HR 91 | Ht 59.0 in | Wt 160.8 lb

## 2022-11-03 DIAGNOSIS — E1165 Type 2 diabetes mellitus with hyperglycemia: Secondary | ICD-10-CM | POA: Diagnosis not present

## 2022-11-03 DIAGNOSIS — E042 Nontoxic multinodular goiter: Secondary | ICD-10-CM

## 2022-11-03 DIAGNOSIS — E119 Type 2 diabetes mellitus without complications: Secondary | ICD-10-CM

## 2022-11-03 DIAGNOSIS — E039 Hypothyroidism, unspecified: Secondary | ICD-10-CM | POA: Diagnosis not present

## 2022-11-03 DIAGNOSIS — Z794 Long term (current) use of insulin: Secondary | ICD-10-CM

## 2022-11-03 LAB — POCT GLYCOSYLATED HEMOGLOBIN (HGB A1C): Hemoglobin A1C: 5.3 % (ref 4.0–5.6)

## 2022-11-03 MED ORDER — NOVOLOG FLEXPEN 100 UNIT/ML ~~LOC~~ SOPN: 5.0000 [IU] | PEN_INJECTOR | Freq: Three times a day (TID) | SUBCUTANEOUS | 3 refills | Status: DC

## 2022-11-03 NOTE — Progress Notes (Signed)
Patient ID: [name], female   DOB: [birth year], 72 y.o.   MRN: [id number]  HPI: [name] is a 26 y.o.-year-old female, initially referred by her PCP, Dr. Lodema Hong, presenting for follow-up for DM2, dx 1995, insulin-dependent since 1997, uncontrolled, with complications (gastroparesis, cerebrovasc. Ds-  H/o stroke, PN),  Hypothyroidism, thyroid nodules.  Last visit 6 months ago.  Interim history: She also continues to have neck pain, headaches, disequilibrium -had a fall when getting out of bed recently.  She is trying to get a trapeze device to help her get out of bed. She has blurry vision but no chest pain, or increased urination. She had shoulder replacement in 08/2022. The pain is improving. She became delirious after the surgery >> was in rehab for ~1 mo. She was getting steroid injections >> not since the sx.  DM2: Reviewed HbA1c levels: Lab Results  Component Value Date   HGBA1C 5.2 08/08/2022   HGBA1C 5.5 04/20/2022   HGBA1C 5.7 (H) 12/23/2021  10/28/2013: HbA1c 8.5%  She is on: - Toujeo 20 >> 22 units daily - Novolog - now 4 units before each meal Could not tolerate Metformin >> nausea.   Insulin doses were decreased in 04/2017 due to low blood sugars in the 40s. Nephrology started [facility] but stopped "it did not agree with me".  Pt checks her sugars 4 times a day: - am:  140-170, 200s >> 103-159 >> 100-122 - 2h after b'fast: n/c >> 110 >> n/c - before lunch: 89-192 >> 140-155 >> 134 -150s >> 111-140 - 2h after lunch:  n/c >> 127, 215, 241 >> n/c - before dinner: 120-130 >> 140-155 >> 127-140 >> 140-150 - 2h after dinner: n/c - bedtime: 83-190, 194-414 >> 99-100s >> 200s, 325 - steroids >> n/c - nighttime: n/c >> 134 >> n/c Lowest sugar was 64 >> 40s >> 89 >> 30s (slept and delayed meals, not eating well) >> 103 >> 100; she has hypoglycemia awareness in the 70s. Highest sugar was 542 (steroid inj) ...  300 >> 500 -right after getting out of rehab.  Pt's meals  are: - Breakfast: eggs, cereals, fruit, oatmeal, bacon - Lunch: sandwich, beef, mashed potatoes,diet soda - Dinner: chicken, beef, fish, potatoes, vegetables - Snacks: 1-2: fruit  -+ Mild CKD; last BUN/creatinine:  Lab Results  Component Value Date   BUN 21 10/26/2022   CREATININE 1.01 (H) 10/26/2022  On losartan.  -+ HL; last set of lipids: Lab Results  Component Value Date   CHOL 123 06/29/2022   HDL 54 06/29/2022   LDLCALC 53 06/29/2022   TRIG 81 06/29/2022   CHOLHDL 2.3 06/29/2022  On Crestor and Zetia.  - last eye exam was 05/27/2022: + DR reportedly; Had cataract sx B. Dr. Daphine Deutscher [facility]). + L eye pain.  -She has numbness and tingling mainly in the left leg-affected by stroke. Sees podiatry.  She had foot exam by podiatry (Dr. Burna Mortimer) 10/09/2022.  Hypothyroidism: -Diagnosed many years ago  She takes levothyroxine 50 mcg daily: - in am - fasting - at least 30 min from b'fast - no calcium - no iron - + multivitamins with lunch  - + Aciphex -moved later in the day - no PPIs - +on Biotin and B complex  Reviewed her TFTs: Lab Results  Component Value Date   TSH 1.060 06/29/2022   TSH 0.494 10/04/2021   TSH 0.710 09/16/2021   TSH 0.85 05/19/2021   TSH 0.631 12/11/2019   Thyroid nodules.  Pt denies: - feeling nodules in neck - hoarseness But she continues to have chronic dysphagia with certain foods.  She had previous esophageal dilations. She also has choking.  I reviewed previous imaging and biopsy tests: FNA (2014) x2: Benign  thyroid U/S (10/2013): Right thyroid lobe: 3.7 x 1.0 x 1.8 cm  Left thyroid lobe: 3.5 x 1.5 x 1.6 cm  Isthmus: 0.5 cm  Focal nodules:  There is a 0.3 mm calcified nodule in the right midzone.  There is a 0.6 x 0.7 x 0.5 cm hypoechoic nodule in the  lateral aspect of the right midzone.  There is a 0.7 x 0.6 x 0.5 cm hyperechoic nodule in the lateral aspect of the right lower pole.  There is a 2.2 x 1.1 x 1.7 cm complex  solid nodule in the inferior aspect of the isthmus extending adjacent to the left lower pole.  There is a 1.4 x 2.1 x 1.4 cm complex solid nodule in the left lower pole.  Lymphadenopathy: None visualized.  Repeat U/S (01/2014): Stable appearance of the nodules  Repeat U/S (10/2015): stable appearance of the nodules  Repeat U/S (10/2016): Stable appearance of the nodules  Repeat U/S (02/02/2021): Stable appearance of nodules  She had a L rib fracture in 12/2014. She had a R ankle fracture 02/20/2020 >> bone stimulator to help with healing. Patient was admitted with AMS + sepsis on 10/15/2016.  She had aspiration pneumonia and acute respiratory failure She has a recurrent meningioma >> seeing Dr. Jule Ser. She was admitted 09/15/2021 after a fall, and found to have dehydration, AKI and hyperkalemia (6.2) -considered the reason for her weakness and subsequent fall.   She is on Fosamax. She has a h/o pancreatitis.   ROS: + see HPI  I reviewed pt's medications, allergies, PMH, social hx, family hx, and changes were documented in the history of present illness. Otherwise, unchanged from my initial visit note.  Past Medical History:  Diagnosis Date   Allergy    Anemia    Anemia in chronic kidney disease (CKD) 08/10/2021   Anemia in chronic kidney disease (CKD) 10/12/2021   Anxiety    takes Ativan daily   Arthritis    Assistance needed for mobility    Bipolar disorder (HCC)    takes Risperdal nightly   Blood transfusion    Brain tumor (HCC)    Cancer (HCC)    In her gum   Carpal tunnel syndrome of right wrist 05/23/2011   Cervical disc disorder with radiculopathy of cervical region 10/31/2012   Chronic back pain    Chronic idiopathic constipation    Chronic neck and back pain    Colon polyps    COPD (chronic obstructive pulmonary disease) with chronic bronchitis 09/16/2013   Office Spirometry 10/30/2013-submaximal effort based on appearance of loop and curve. Numbers would fit  with severe restriction but her physiologic capability may be better than this. FVC 0.91/44%, and 10.74/45%, FEV1/FVC 0.81, FEF 25-75% 1.43/69%     Diabetes mellitus    Type II   Diverticulosis    TCS 9/08 by Dr. Lina Sar for diarrhea . Bx for micro scopic colitis negative.    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   Metabolic encephalopathy 08/03/2011   Migraines    chronic headaches   Mononeuritis lower limb    Narcolepsy    Osteoporosis    Pancreatitis 2006   due to Depakote with normal EUS    Paralysis (HCC)    Pneumonia    Schatzki's ring    non critical / EGD with ED 8/2011with RMR   Seizures (HCC)    takes Lamictal daily.Last seizure 3 yrs ago   Sleep apnea    on CPAP   Small bowel obstruction (HCC)    Stroke (HCC)    left sided weakness, speech changes   Tubular adenoma of colon    Past Surgical History:  Procedure Laterality Date   ABDOMINAL HYSTERECTOMY  1978   BACK SURGERY  July 2012   BACTERIAL OVERGROWTH TEST N/A 05/05/2013   Procedure: BACTERIAL OVERGROWTH TEST;  Surgeon: Corbin Ade, MD;  Location: [facility];  Service: Endoscopy;  Laterality: N/A;  7:30   BIOPSY THYROID  2009   BRAIN SURGERY  11/2011   resection of meningioma   BREAST REDUCTION SURGERY  1994   CARDIAC CATHETERIZATION  05/10/2005   normal coronaries, normal LV systolic function and EF (Dr. Evlyn Courier)   CARPAL TUNNEL RELEASE Left 07/22/04   Dr. Romeo Apple   CATARACT EXTRACTION Bilateral     CHOLECYSTECTOMY  1984   COLONOSCOPY N/A 09/25/2012   RUE:[id number] diverticulosis.  colonic polyp-removed : tubular adenoma   CRANIOTOMY  11/23/2011   Procedure: CRANIOTOMY TUMOR EXCISION;  Surgeon: Hewitt Shorts, MD;  Location: [facility]URO OR[facility];  Service: Neurosurgery;  Laterality: N/A;  Craniotomy for tumor resection   ESOPHAGOGASTRODUODENOSCOPY  12/29/2010   Rourk-Retained food in the esophagus and stomach, small hiatal hernia, status post Maloney dilation of the esophagus   ESOPHAGOGASTRODUODENOSCOPY N/A 09/25/2012   [id number] atonic baggy esophagus status post Maloney dilation 56 F. Hiatal hernia   GIVENS CAPSULE STUDY N/A 01/15/2013   NORMAL.    IR GENERIC HISTORICAL  03/17/2016   IR RADIOLOGIST EVAL & MGMT 03/17/2016 [facility]   LESION REMOVAL N/A 05/31/2015   Procedure: REMOVAL RIGHT AND LEFT LESIONS OF MANDIBLE;  Surgeon: Ocie Doyne, DDS;  Location: [facility] OR;  Service: Oral Surgery;  Laterality: N/A;   MALONEY DILATION  12/29/2010   RMR;   NM MYOCAR PERF WALL MOTION  2006   "relavtiely normal" persantine, mild anterior thinning (breast attenuation artifact), no region of scar/ischemia   OVARIAN CYST REMOVAL     RECTOCELE REPAIR N/A 06/29/2015   Procedure: POSTERIOR REPAIR (RECTOCELE);  Surgeon: Tilda Burrow, MD;  Location: [facility] ORS;  Service: Gynecology;  Laterality: N/A;   REDUCTION MAMMAPLASTY Bilateral    REVERSE SHOULDER ARTHROPLASTY Left 08/18/2022   Procedure: REVERSE SHOULDER ARTHROPLASTY;  Surgeon: Beverely Low, MD;  Location: [facility] ORS;  Service: Orthopedics;  Laterality: Left;  choice with interscalene, general   SPINE SURGERY  09/29/2010   Dr. Shon Baton   surgical excision of 3 tumors from right thigh and right buttock  and left upper thigh  2010   TOOTH EXTRACTION Bilateral 12/14/2014   Procedure: REMOVAL OF BILATERAL MANDIBULAR EXOSTOSES;  Surgeon: Ocie Doyne, DDS;  Location: [facility] OR;  Service: Oral  Surgery;  Laterality: Bilateral;   TRANSTHORACIC ECHOCARDIOGRAM  2010   EF  60-65%, mild conc LVH, grade 1 diastolic dysfunction; mildly calcified MV annulus with mildly thickened leaflets, mildly calcified MR annulus   History   Social History   Marital Status: Divorced    Spouse Name: N/A    Number of Children: 1   Years of Education: 12   Occupational History   disabled     Social History Main Topics   Smoking status: Current Every Day Smoker -- 0.25 packs/day for 7 years    Types: Cigarettes   Smokeless tobacco: Never Used     Comment: "started back but off now for 3 months" (08/18/13)   Alcohol Use: No     Comment:     Drug Use: No   Current Outpatient Medications on File Prior to Visit  Medication Sig Dispense Refill   ACCU-CHEK GUIDE test strip USE TO CHECK BLOOD SUGAR FOUR TIMES A DAY AND AS NEEDED (NEED MD APPOINTMENT FOR ANY FURTHER REFILLS) 450 strip 3   Accu-Chek Softclix Lancets lancets TEST BLOOD SUGAR THREE TIMES DAILY AS DIRECTED 300 each 2   acetaminophen (TYLENOL) 325 MG tablet Take 650 mg by mouth every 6 (six) hours as needed for mild pain, moderate pain or headache.     albuterol (VENTOLIN HFA) 108 (90 Base) MCG/ACT inhaler INHALE 1 TO 2 PUFFS EVERY 6 HOURS AS NEEDED FOR WHEEZING, SHORTNESS OF BREATH 3 each 0   Alcohol Swabs (DROPSAFE ALCOHOL PREP) 70 % PADS USE TO CLEAN FINGER PRIOR TO TESTING FOR BLOOD SUGAR AS DIRECTED 300 each 3   alendronate (FOSAMAX) 70 MG tablet TAKE 1 TABLET EVERY 7 DAYS ON AN EMPTY STOMACH WITH A FULL GLASS OF WATER 12 tablet 3   amLODipine (NORVASC) 10 MG tablet TAKE ONE TABLET BY MOUTH ONCE DAILY. 30 tablet 0   aspirin EC 81 MG tablet Take 1 tablet (81 mg total) by mouth daily with breakfast. 120 tablet 2   Blood Glucose Calibration (ACCU-CHEK GUIDE CONTROL) LIQD USE AS DIRECTED 1 each 0   blood glucose meter kit and supplies Dispense based on patient and insurance preference. Use up to four times daily as directed. (FOR ICD-10 E10.9, E11.9). 1 each 0   buPROPion (WELLBUTRIN XL) 150 MG 24 hr tablet Take 1  tablet (150 mg total) by mouth every morning. 90 tablet 2   Calcium Carb-Cholecalciferol (CALTRATE 600+D3 SOFT) 600-20 MG-MCG CHEW Chew 1 tablet by mouth daily.     carvedilol (COREG) 12.5 MG tablet Take 12.5 mg by mouth 2 (two) times daily with a meal.     Cholecalciferol (D3 PO) Take 1 tablet by mouth daily.     clobetasol (TEMOVATE) 0.05 % external solution Apply 1 Application topically 2 (two) times daily. (Patient taking differently: Apply 1 Application topically See admin instructions. Apply to affected area(s) 2 times a day) 50 mL 1   Continuous Blood Gluc Sensor (FREESTYLE LIBRE 14 DAY SENSOR) MISC 1 each by Does not apply route every 14 (fourteen) days. Change every 2 weeks (Patient not taking: Reported on 10/30/2022) 2 each 11   Docusate Sodium (DSS) 100 MG CAPS Take 100 mg by mouth in the morning and at bedtime.     DROPLET PEN NEEDLES 31G X 8 MM MISC USE FOR INJECTING INSULIN 4 TIMES DAILY. 400 each 0   Elastic Bandages & Supports (ADJUSTABLE ARM SLING) MISC L arm sling 1 each 0   ezetimibe (ZETIA) 10 MG tablet TAKE 1  TABLET EVERY DAY 90 tablet 10   hydrochlorothiazide (MICROZIDE) 12.5 MG capsule Take 12.5 mg by mouth daily.     insulin aspart (NOVOLOG FLEXPEN) 100 UNIT/ML FlexPen Inject 5 Units into the skin 3 (three) times daily with meals. Inject 5 units into the skin three times a day with meals and use only if 50% or more of a meal is eaten     insulin glargine, 2 Unit Dial, (TOUJEO MAX SOLOSTAR) 300 UNIT/ML Solostar Pen Inject 20-26 Units into the skin at bedtime. 9 mL 1   ipratropium (ATROVENT) 0.03 % nasal spray Place 2 sprays into both nostrils every 12 (twelve) hours. 30 mL 12   ketoconazole (NIZORAL) 2 % shampoo Apply 1 Application topically 2 (two) times a week. 120 mL 0   lamoTRIgine (LAMICTAL) 100 MG tablet Take 1 tablet (100 mg total) by mouth 2 (two) times daily. 180 tablet 2   levothyroxine (SYNTHROID) 50 MCG tablet Take 1 tablet (50 mcg total) by mouth See admin  instructions. Take 25 mcg by mouth before breakfast on Sunday and 50 mcg on Mon/Tues/Wed/Thurs/Fri/Sat 90 tablet 1   lidocaine (HM LIDOCAINE PATCH) 4 % Place 1 patch onto the skin daily. 20 patch 1   LORazepam (ATIVAN) 0.5 MG tablet Take 1 tablet (0.5 mg total) by mouth 2 (two) times daily. 60 tablet 2   metoprolol tartrate (LOPRESSOR) 50 MG tablet TAKE 1 TABLET TWICE DAILY (NEED MD APPOINTMENT) 180 tablet 1   Misc. Devices (MATTRESS PAD) MISC FIRM MATTRESS PAD for hospital bed x 1 1 each 0   Multiple Vitamins-Minerals (MULTIVITAMIN GUMMIES ADULT PO) Take 1 tablet by mouth daily.     MYRBETRIQ 25 MG TB24 tablet TAKE 1 TABLET EVERY DAY 90 tablet 3   nicotine (NICODERM CQ) 21 mg/24hr patch Place 1 patch (21 mg total) onto the skin daily. (Patient not taking: Reported on 10/30/2022) 28 patch 0   ondansetron (ZOFRAN) 4 MG tablet Take 1 tablet (4 mg total) by mouth every 8 (eight) hours as needed for nausea or vomiting. 20 tablet 0   potassium chloride (KLOR-CON) 10 MEQ tablet Take 1 tablet (10 mEq total) by mouth 2 (two) times daily. 30 tablet 2   pregabalin (LYRICA) 75 MG capsule Take 75 mg by mouth daily.     RABEprazole (ACIPHEX) 20 MG tablet TAKE 1 TABLET TWICE DAILY 180 tablet 2   RESTASIS 0.05 % ophthalmic emulsion Place 2 drops into both eyes daily.     risperiDONE (RISPERDAL) 0.5 MG tablet Take 0.5 mg by mouth at bedtime.     rosuvastatin (CRESTOR) 5 MG tablet TAKE 1 TABLET AT BEDTIME 90 tablet 3   sertraline (ZOLOFT) 100 MG tablet Take 1 tablet (100 mg total) by mouth every morning. 90 tablet 2   spironolactone (ALDACTONE) 50 MG tablet TAKE 1 TABLET (50 MG TOTAL) BY MOUTH DAILY. DISCONTINUE SPIRONOLACTONE 25MG  90 tablet 3   sulfamethoxazole-trimethoprim (BACTRIM DS) 800-160 MG tablet Take 1 tablet by mouth 2 (two) times daily. 14 tablet 0   telmisartan (MICARDIS) 20 MG tablet Take 10 mg by mouth daily.     traZODone (DESYREL) 150 MG tablet Take 1 tablet (150 mg total) by mouth at bedtime. 30  tablet 2   umeclidinium-vilanterol (ANORO ELLIPTA) 62.5-25 MCG/ACT AEPB Inhale 1 puff into the lungs daily. 60 each 5   UNABLE TO FIND Med Name:  G5 Mattress 1 each 0   vitamin E 180 MG (400 UNITS) capsule Take 400 Units by mouth daily.  No current facility-administered medications on file prior to visit.      Allergies  Allergen Reactions   Cephalexin Hives   Iron Nausea And Vomiting   Milk-Related Compounds Other (See Comments)    Doesn't agree with stomach.    Penicillins Hives        Phenazopyridine Hcl Hives         Family History  Problem Relation Age of Onset   Heart attack Mother        HTN   Pneumonia Father    Kidney failure Father    Diabetes Father    Pancreatic cancer Sister    Cancer Sister        breast    Cancer Sister        pancreatic   Diabetes Brother    Hypertension Brother    Diabetes Brother    Alcohol abuse Maternal Uncle    Stroke Maternal Grandmother    Heart attack Maternal Grandfather    Hypertension Son    Sleep apnea Son    Colon cancer Neg Hx    Anesthesia problems Neg Hx    Hypotension Neg Hx    Malignant hyperthermia Neg Hx    Pseudochol deficiency Neg Hx    Breast cancer Neg Hx    Stomach cancer Neg Hx    PE: BP (!) 146/70 Comment: Has not taken her BP meds  Pulse 91   Ht 4\' 11"  (1.499 m)   Wt 160 lb 12.8 oz (72.9 kg)   SpO2 99%   BMI 32.48 kg/m    Wt Readings from Last 3 Encounters:  11/03/22 160 lb 12.8 oz (72.9 kg)  10/26/22 161 lb (73 kg)  10/20/22 161 lb (73 kg)   Constitutional: overweight, in NAD, walks with a walker.  Difficulty rising from a sitting position without help. Eyes: EOMI, no exophthalmos ENT: no thyromegaly, no cervical lymphadenopathy Cardiovascular: RRR, No MRG Respiratory: CTA B Musculoskeletal: no deformities Skin: moist, warm, no rashes Neurological: no tremor with outstretched hands  ASSESSMENT: 1. DM2, insulin-dependent, uncontrolled, without complications - gastroparesis -  cerebrovasc. Ds -  H/o stroke - PN  She was interested in an insulin pump >> discussed about [employer] (given brochure).  2. Hypothyroidism  3. MNG - Thyroid U/S (10/11/2012): Right thyroid lobe:  3.7 x 1.0 x 1.8 cm Left thyroid lobe:   3.5 x 1.5 x 1.6 cm Isthmus:  0.5 cm Focal nodules:  There is a 0.3 mm calcified nodule in the right midzone.  There is a 0.6 x 0.7 x 0.5 cm hypoechoic nodule in the lateral aspect of the right midzone.  There is a 0.7 x 0.6 x 0.5 cm hyperechoic nodule in the lateral aspect of the right lower pole. There is a 2.2 x 1.1 x 1.7 cm complex solid nodule in the inferior aspect of the isthmus extending adjacent to the left lower pole. There is a 1.4 x 2.1 x 1.4 cm complex solid nodule in the left lower pole.  Lymphadenopathy:  None visualized.   IMPRESSION: Multi nodular goiter which has markedly progressed since the prior exam.  The dominant nodule at the inferior aspect of the isthmus fits criteria for fine needle aspiration biopsy if not previously Assessed.  - FNA (11/05/2012) x2: benign  - Thyroid U/S (01/20/2014) - felt one nodule enlarging >> new U/S: stable appearance of the nodules, except a small new nodule in isthmus: 1.2 cm  Right thyroid lobe Measurements: 4.4 cm x 1.2 cm x 1.7  cm. Multiple right-sided nodules identified. Each right-sided nodule demonstrates increased echogenicity, with the superior measuring 7 mm -8 mm, most inferior measuring 9 mm - 10 mm. A small, 3 mm focus of calcium with posterior shadowing is evident. There is also a mid nodule measuring 7 mm, which is echogenic. Left thyroid lobe Measurements: 5.1 cm x 2.1 cm x 2.3 cm. Dominant lesion at the inferior pole of left thyroid is again evident, which has been previously biopsied (10/29/2012). This nodule measures 1.8 cm x 1.7cm x 2.5 cm. (Previous 1.4 cm x 2.1 cm x 1.4 cm) Isthmus Thickness: 4 mm-5 mm. Isthmic nodule again noted, previously biopsied (10/29/2012). Currently this nodule  measures 2.2 cm x 1.4 cm x 1.8 cm. (previous measurement 2.2 cm x 1.1 cm x 1.7 cm). There is a new nodule identified within the isthmus with heterogeneously hyperechoic characteristics. This nodule measures 12 mm x 5.4 mm x 7.2 mm. Lymphadenopathy: None visualized.  - Thyroid U/S (11/09/2015): Parenchymal Echotexture: Moderately heterogenous Estimated total number of nodules > 1 cm: <5 Number of spongiform nodules > 2 cm not described below (TR1): 0 Number of mixed cystic nodules > 1.5 cm not described below (TR2): 0 _____________________________________________________  Isthmus: 0.7 cm  Nodule # 1: Prior biopsy: No Location: Isthmus; left Size: 1.5 x 1.3 x 1.5 cm, previously 2.2 x 1.4 x 1.8 cm Composition: solid/almost completely solid (2) Echogenicity: hyperechoic (1) ACR TI-RADS total points: 3. Change in features: Yes. Increased internal cystic degeneration and smaller in size. Change in ACR TI-RADS risk category: No  *Given size (1.5 - 2.4 cm) and appearance, a follow-up ultrasound in 1 year is recommended based on TI-RADS criteria as clinically indicated.  _________________________________________________________  Right lobe: 4.6 x 1.1 x 1.9 cm, previously 4.4 x 1.2 x 1.7 cm Stable scattered subcentimeter echogenic nodules _________________________________________________________  Left lobe: 4.8 x 2.1 x 1.9 cm, previously 5.1 x 2.1 x 2.3 cm Nodule # 1: Prior biopsy: No Location: Left; Inferior Size: 2.4 x 1.7 x 1.9 cm, previously 2.5 x 1.7 x 1.7 Composition: solid/almost completely solid (2) Echogenicity: hyperechoic (1) ACR TI-RADS total points: 3. ACR TI-RADS risk category: TR3 (3 points). Change in features: No Change in ACR TI-RADS risk category: No *Given size (1.5 - 2.4 cm) and appearance, a follow-up ultrasound in 1 year is recommended based on TI-RADS criteria as clinically indicated.  IMPRESSION: TR 3 isthmic and left thyroid nodules unchanged. *Given  size (1.5 - 2.4 cm) and appearance of both thyroid nodules, a follow-up ultrasound in 1 year is recommended based on TI-RADS criteria as clinically indicated.  10/12/2016: Thyroid U/S: Parenchymal Echotexture: Moderately heterogenous Isthmus: 1.1 cm Right lobe: 3.8 x 1.0 x 1.7 cm Left lobe: 4.5 x 1.7 x 2.1 cm  ______________________________________________________   Estimated total number of nodules >/= 1 cm: 3  _____________________________________________   Diffusely heterogeneous multinodular thyroid gland. As seen previously, there are multiple small echogenic nodules scattered throughout the right gland which do not meet criteria for biopsy or follow-up. Head and lobular pyramidal lobe extends superiorly from the thyroid isthmus. No interval change.   The previously biopsied nodule in the inferior aspect of the isthmus measures slightly smaller today at 1.5 x 1.0 x 1.4 cm compared to 1.7 x 1.3 x 1.5 cm previously.   The previously biopsied nodule in the left inferior gland is essentially unchanged at 2.6 x 1.3 x 1.5 cm compared to 2.4 x 1.8 x 1.9 cm. No new nodule or abnormality identified.  IMPRESSION: 1. Stable multinodular goiter with a prominent lobular parental lobe extending superiorly from the thyroid isthmus. 2. The previously biopsied nodule in the inferior isthmus measures slightly smaller on today's examination. 3. The previously biopsied nodule in the left inferior gland is stable. 4. Additional scattered small and benign-appearing nodules do not meet criteria for biopsy or continued follow-up.  Thyroid U/S (02/02/2021): Parenchymal Echotexture: Mildly heterogeneous Isthmus: 0.6 cm Right lobe: 4.4 x 0.8 x 1.7 cm  Left lobe: 4.5 x 2.1 x 3.6 cm  ______________________________________________________   Nodule # 1:  Prior biopsy: Yes-10/29/2012  Location: Isthmus; superior  Maximum size: 2.6 cm; Other 2 dimensions: 1.0 x 2.0 cm, previously, 1.5 x 1.0 x  1.4 cm  Composition: mixed cystic and solid (1)  Echogenicity: isoechoic (1) Significant change in size (>/= 20% in two dimensions and minimal increase of 2 mm): Yes (predominately due to increase in cystic component) Change in features: Yes Change in ACR TI-RADS risk category: Yes This nodule does NOT meet TI-RADS criteria for biopsy or dedicated follow-up.  _________________________________________________________   Nodule 2: 1.0 x 0.8 x 0.8 cm hypoechoic (actually hyperechoic) nodule in the inferior right thyroid lobe does (NOT) meet criteria for FNA or imaging surveillance. _________________________________________________________   There is diffuse heterogeneity of the left thyroid lobe without discrete nodules.   IMPRESSION: Previously biopsied isthmus nodule demonstrates interval development of cystic component. It does not meet criteria for repeat biopsy at this time. No new suspicious thyroid nodules.   This did not show appears to have increased in size, but this is actually due to the increased cystic component.  This nodule has been previously biopsied with benign results. Called and discussed with Dr.Mir with [facility] and he acknowledged that the changes in red above have been dictation errors and he addended the report.  PLAN:  1. Patient with longstanding, uncontrolled, type 2 diabetes, basal-bolus insulin regimen, now with much lower doses of NovoLog compared to last visit, after her rehab stay. -Of note, she was not able to start a CGM despite seeing the diabetes educator and seeing how this was attached.  She prefers not to use it. -She has a history of dehydration and hyperkalemia in the setting of AKI so she is not a good candidate for SGLT2 inhibitors.  Also, she has a history of pancreatitis so GLP-1 receptor agonist are not real for her. -At this visit, sugars are mostly at goal but increasing this day went by.  This is likely due to the low doses of NovoLog.   I advised her to increase this slightly, but she does not need to go back to the doses that we were previously using for her.  I did advise her to use higher doses if she gets steroid injections.  She increased the dose of Toujeo since last visit and will stay with a slightly higher dose for now. - I advised her to:  Patient Instructions  Please continue: - Toujeo 22 units daily  Change: - Novolog  5 units for smaller meals  8 units for large meals   If you have a steroid injection, you may need higher doses of NovoLog: 8-12 units for meals.  Please continue levothyroxine 50 mcg daily.   Take the thyroid hormone every day, with water, at least 30 minutes before breakfast, separated by at least 4 hours from: - acid reflux medications - calcium - iron - multivitamins   Please return in 4 months.  - we checked her HbA1c:  5.3% (slightly higher, but still lower than expected from her sugars at home) - advised to check sugars at different times of the day - 4x a day, rotating check times - advised for yearly eye exams >> she is UTD - return to clinic in 4 months   2. Hypothyroidism - latest thyroid labs reviewed with pt. >> normal: Lab Results  Component Value Date   TSH 1.060 06/29/2022  - she continues on LT4 50 mcg daily - pt feels good on this dose. - we discussed about taking the thyroid hormone every day, with water, >30 minutes before breakfast, separated by >4 hours from acid reflux medications, calcium, iron, multivitamins. Pt. is taking it correctly.  3. MNG -She had a benign thyroid nodule biopsy in 2014 -Reviewing the reports of her thyroid ultrasound from 2014-2022, the nodules remained stable.  Latest thyroid ultrasound was from 01/2021 and one of the nodules larger, but this was due to fluid accumulation.  I did not suggest further intervention. -She has chronic mild dysphagia, but no esophageal compression from the thyroid for the swallowing study from 08/2024.  She  has had multiple esophageal dilations in the past. -Will continue to follow her clinically  Carlus Pavlov, MD PhD [facility]

## 2022-11-03 NOTE — Addendum Note (Signed)
Addended by: Pollie Meyer on: 11/03/2022 01:58 PM   Modules accepted: Orders

## 2022-11-03 NOTE — Patient Instructions (Addendum)
Please continue: - Toujeo 22 units daily  Change: - Novolog  5 units for smaller meals  8 units for large meals   If you have a steroid injection, you may need higher doses of NovoLog: 8-12 units for meals.  Please continue levothyroxine 50 mcg daily.   Take the thyroid hormone every day, with water, at least 30 minutes before breakfast, separated by at least 4 hours from: - acid reflux medications - calcium - iron - multivitamins   Please return in 4 months.

## 2022-11-07 ENCOUNTER — Telehealth: Payer: Self-pay | Admitting: Family Medicine

## 2022-11-07 DIAGNOSIS — Z471 Aftercare following joint replacement surgery: Secondary | ICD-10-CM | POA: Diagnosis not present

## 2022-11-07 DIAGNOSIS — F319 Bipolar disorder, unspecified: Secondary | ICD-10-CM | POA: Diagnosis not present

## 2022-11-07 DIAGNOSIS — I129 Hypertensive chronic kidney disease with stage 1 through stage 4 chronic kidney disease, or unspecified chronic kidney disease: Secondary | ICD-10-CM | POA: Diagnosis not present

## 2022-11-07 DIAGNOSIS — E1122 Type 2 diabetes mellitus with diabetic chronic kidney disease: Secondary | ICD-10-CM | POA: Diagnosis not present

## 2022-11-07 DIAGNOSIS — N3942 Incontinence without sensory awareness: Secondary | ICD-10-CM | POA: Diagnosis not present

## 2022-11-07 DIAGNOSIS — G839 Paralytic syndrome, unspecified: Secondary | ICD-10-CM | POA: Diagnosis not present

## 2022-11-07 DIAGNOSIS — N189 Chronic kidney disease, unspecified: Secondary | ICD-10-CM | POA: Diagnosis not present

## 2022-11-07 DIAGNOSIS — N3000 Acute cystitis without hematuria: Secondary | ICD-10-CM | POA: Diagnosis not present

## 2022-11-07 DIAGNOSIS — D631 Anemia in chronic kidney disease: Secondary | ICD-10-CM | POA: Diagnosis not present

## 2022-11-07 DIAGNOSIS — I69354 Hemiplegia and hemiparesis following cerebral infarction affecting left non-dominant side: Secondary | ICD-10-CM | POA: Diagnosis not present

## 2022-11-07 DIAGNOSIS — I69328 Other speech and language deficits following cerebral infarction: Secondary | ICD-10-CM | POA: Diagnosis not present

## 2022-11-07 NOTE — Telephone Encounter (Signed)
Patient called saying she has to wear a pull up morning and night time. Constantly running to restroom and does patient need to have her urine check she is ant Dr [name] waiting on his call today. She asked if Dr Lodema Hong can send something in for her nausea feeling to [facility].

## 2022-11-08 DIAGNOSIS — D649 Anemia, unspecified: Secondary | ICD-10-CM | POA: Diagnosis not present

## 2022-11-08 DIAGNOSIS — D638 Anemia in other chronic diseases classified elsewhere: Secondary | ICD-10-CM | POA: Diagnosis not present

## 2022-11-08 DIAGNOSIS — E1122 Type 2 diabetes mellitus with diabetic chronic kidney disease: Secondary | ICD-10-CM | POA: Diagnosis not present

## 2022-11-08 DIAGNOSIS — R809 Proteinuria, unspecified: Secondary | ICD-10-CM | POA: Diagnosis not present

## 2022-11-08 DIAGNOSIS — N1831 Chronic kidney disease, stage 3a: Secondary | ICD-10-CM | POA: Diagnosis not present

## 2022-11-08 DIAGNOSIS — D7589 Other specified diseases of blood and blood-forming organs: Secondary | ICD-10-CM | POA: Diagnosis not present

## 2022-11-08 DIAGNOSIS — D539 Nutritional anemia, unspecified: Secondary | ICD-10-CM | POA: Diagnosis not present

## 2022-11-08 DIAGNOSIS — N189 Chronic kidney disease, unspecified: Secondary | ICD-10-CM | POA: Diagnosis not present

## 2022-11-08 DIAGNOSIS — E1129 Type 2 diabetes mellitus with other diabetic kidney complication: Secondary | ICD-10-CM | POA: Diagnosis not present

## 2022-11-08 NOTE — Telephone Encounter (Signed)
[id number] 

## 2022-11-10 DIAGNOSIS — E1129 Type 2 diabetes mellitus with other diabetic kidney complication: Secondary | ICD-10-CM | POA: Diagnosis not present

## 2022-11-10 DIAGNOSIS — E1122 Type 2 diabetes mellitus with diabetic chronic kidney disease: Secondary | ICD-10-CM | POA: Diagnosis not present

## 2022-11-10 DIAGNOSIS — N1831 Chronic kidney disease, stage 3a: Secondary | ICD-10-CM | POA: Diagnosis not present

## 2022-11-10 DIAGNOSIS — R809 Proteinuria, unspecified: Secondary | ICD-10-CM | POA: Diagnosis not present

## 2022-11-11 ENCOUNTER — Other Ambulatory Visit: Payer: Self-pay | Admitting: Internal Medicine

## 2022-11-11 DIAGNOSIS — E039 Hypothyroidism, unspecified: Secondary | ICD-10-CM

## 2022-11-12 DIAGNOSIS — D631 Anemia in chronic kidney disease: Secondary | ICD-10-CM | POA: Diagnosis not present

## 2022-11-12 DIAGNOSIS — R519 Headache, unspecified: Secondary | ICD-10-CM | POA: Diagnosis not present

## 2022-11-12 DIAGNOSIS — D509 Iron deficiency anemia, unspecified: Secondary | ICD-10-CM | POA: Diagnosis not present

## 2022-11-12 DIAGNOSIS — N1831 Chronic kidney disease, stage 3a: Secondary | ICD-10-CM | POA: Diagnosis not present

## 2022-11-12 DIAGNOSIS — I129 Hypertensive chronic kidney disease with stage 1 through stage 4 chronic kidney disease, or unspecified chronic kidney disease: Secondary | ICD-10-CM | POA: Diagnosis not present

## 2022-11-12 DIAGNOSIS — J449 Chronic obstructive pulmonary disease, unspecified: Secondary | ICD-10-CM | POA: Diagnosis not present

## 2022-11-12 DIAGNOSIS — I10 Essential (primary) hypertension: Secondary | ICD-10-CM | POA: Diagnosis not present

## 2022-11-12 DIAGNOSIS — F5089 Other specified eating disorder: Secondary | ICD-10-CM | POA: Diagnosis not present

## 2022-11-12 DIAGNOSIS — E1122 Type 2 diabetes mellitus with diabetic chronic kidney disease: Secondary | ICD-10-CM | POA: Diagnosis not present

## 2022-11-12 DIAGNOSIS — D329 Benign neoplasm of meninges, unspecified: Secondary | ICD-10-CM | POA: Diagnosis not present

## 2022-11-12 DIAGNOSIS — Z79899 Other long term (current) drug therapy: Secondary | ICD-10-CM | POA: Diagnosis not present

## 2022-11-13 NOTE — Progress Notes (Unsigned)
[facility] [address], [place] [address]   CLINIC:  Medical Oncology/Hematology  PCP:  Kerri Perches, MD [address] [place] [address] [phone]   REASON FOR VISIT:  Follow-up for anemia of CKD and iron deficiency  CURRENT THERAPY: Epogen & IV iron  INTERVAL HISTORY:   [name] 72 y.o. female returns for routine follow-up of her anemia related to CKD and iron deficiency.  She was last evaluated by Dr. Ellin Saba on 05/30/2022.  Since her last visit, she was hospitalized from 08/18/2022 through 08/25/2022 for left shoulder arthroplasty (performed on 08/18/2022), with prolonged hospital stay secondary to AKI, acute confusional state, and elevated blood pressure.  She was discharged to SNF for rehab.  At today's visit, she reports feeling fair.  She remains on Procrit injections since August 2023, but has not received injection since 08/01/2022 due to missing injection appointments on 08/22/2022 (was hospitalized at that time), 09/19/2022, and 10/19/2022.   Blood pressure today is 111/68, overall appears to be within her baseline range. She has chronic breathing issues but denies any acute changes that would be concerning for DVT or PE.   She continues to have unchanged chronic fatigue.   She reports occasional dark stool, with last episode several months ago.   She denies any bright red blood per rectum or hematochezia.   She has pica cravings for ice chips as well as occasional headaches.  She denies any restless legs, chest pain, or syncope.  She has chronic dyspnea on exertion and lightheadedness.   She denies any fever, chills, or unintentional weight loss.  She has 70% energy and 85% appetite. She endorses that she is maintaining a stable weight.  ASSESSMENT & PLAN:  1.  Microcytic anemia - This is a combination anemia from functional iron deficiency (in the setting of chronic disease including COPD, type 2 diabetes, fibromyalgia,  hypertension, and multiple other comorbidities listed in history) and chronic kidney disease stage III. - She has chronic microcytosis - SPEP checked in 2019 was normal. - Hemoccult stool positive in 2014, negative in 2015 - Colonoscopy (11/04/2020): Multiple diverticula, otherwise normal - EGD (11/04/2020): Normal esophagus, gastritis, gastric lipoma - She was previously placed on iron pills, but could not tolerate them due to constipation. - Most recent IV iron Venofer 500 mg x 1 on 06/23/2022 (requires PREMEDICATION with steroids and Zofran due to episode of mild swelling, itching, and nausea after Feraheme in October 2020) - She has been on Epogen since 11/02/2021.  Current dose is 10,000 units every 3 weeks.  Due to multiple recent hospitalizations, she has not received Epogen since 08/01/2022 - missed appointment on 08/22/2022 due to hospital stay and was [place] for appointments in July and August 2024  - She denies any bleeding per rectum or melena, but does have occasional dark stools  - Symptomatic with significant fatigue which is chronic and unchanged - Labs today (11/14/2022): Hgb 9.7/MCV 80.7, ferritin 165, iron saturation 16%.  Normal folate, copper, vitamin B12 when checked in April 2023. - Hemoccult stool x3 ordered in August 2023, but not returned by patient.  Patient declines to complete this test because it "grosses her out." - PLAN: CBC + Epogen 10,000 units every 4 weeks (decreased frequency)  - Repeat CBC, CMP, ferritin, and iron/TIBC with same-day office visit in 3 months  - She follows with Dr. [name] for her CKD stage IIIa  2.  Meningioma - 11/23/11: Craniotomy, resection orbital groove  meningioma [name]) - 10/17/18: SRS for localized recurrent disease [name]) - PLAN: Follows with Dr. [name] (neuro-oncology) annually  PLAN SUMMARY: >> CBC + Epogen every 4 weeks  >> Labs in 3 months (CBC/D, CMP, ferritin, iron/TIBC) >> Office visit in 3 months (same day as  labs/injection)     REVIEW OF SYSTEMS:   Review of Systems  Constitutional:  Positive for fatigue. Negative for appetite change, chills, diaphoresis, fever and unexpected weight change.  HENT:   Negative for lump/mass and nosebleeds.   Eyes:  Negative for eye problems.  Respiratory:  Positive for shortness of breath. Negative for cough and hemoptysis.   Cardiovascular:  Positive for chest pain (at times). Negative for leg swelling and palpitations.  Gastrointestinal:  Positive for constipation, diarrhea, nausea and vomiting. Negative for abdominal pain and blood in stool.  Genitourinary:  Negative for hematuria.   Musculoskeletal:  Positive for arthralgias and back pain.  Skin: Negative.   Neurological:  Positive for dizziness and headaches. Negative for light-headedness.  Hematological:  Does not bruise/bleed easily.     PHYSICAL EXAM:  ECOG PERFORMANCE STATUS: 2 - Symptomatic, <50% confined to bed  Vitals:   11/14/22 1005  BP: 111/68  Pulse: 61  Resp: 18  Temp: 99 F (37.2 C)  SpO2: 100%   Filed Weights   11/14/22 1005  Weight: 166 lb 3.2 oz (75.4 kg)   Physical Exam Constitutional:      Appearance: Normal appearance. She is obese.  Cardiovascular:     Rate and Rhythm: Normal rate and regular rhythm.     Pulses: Normal pulses.     Heart sounds: Normal heart sounds.  Pulmonary:     Effort: Pulmonary effort is normal.     Breath sounds: Decreased breath sounds present.  Neurological:     General: No focal deficit present.     Mental Status: She is alert and oriented to person, place, and time.     Motor: Weakness (Chronic right hand hemiparesis) present.  Psychiatric:        Mood and Affect: Mood normal.        Behavior: Behavior normal.    PAST MEDICAL/SURGICAL HISTORY:  Past Medical History:  Diagnosis Date   Allergy    Anemia    Anemia in chronic kidney disease (CKD) 08/10/2021   Anemia in chronic kidney disease (CKD) 10/12/2021   Anxiety    takes  Ativan daily   Arthritis    Assistance needed for mobility    Bipolar disorder (HCC)    takes Risperdal nightly   Blood transfusion    Brain tumor (HCC)    Cancer (HCC)    In her gum   Carpal tunnel syndrome of right wrist 05/23/2011   Cervical disc disorder with radiculopathy of cervical region 10/31/2012   Chronic back pain    Chronic idiopathic constipation    Chronic neck and back pain    Colon polyps    COPD (chronic obstructive pulmonary disease) with chronic bronchitis 09/16/2013   Office Spirometry 10/30/2013-submaximal effort based on appearance of loop and curve. Numbers would fit with severe restriction but her physiologic capability may be better than this. FVC 0.91/44%, and 10.74/45%, FEV1/FVC 0.81, FEF 25-75% 1.43/69%     Diabetes mellitus    Type II   Diverticulosis    TCS 9/08 by Dr. Lina Sar for diarrhea . Bx for micro scopic colitis negative.    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   Metabolic encephalopathy 08/03/2011   Migraines    chronic headaches   Mononeuritis lower limb    Narcolepsy    Osteoporosis    Pancreatitis 2006   due to Depakote with normal EUS    Paralysis (HCC)    Pneumonia    Schatzki's ring    non critical / EGD with ED 8/2011with RMR   Seizures (HCC)    takes Lamictal daily.Last seizure 3 yrs ago   Sleep apnea    on CPAP   Small bowel obstruction (HCC)    Stroke (HCC)    left sided weakness, speech changes   Tubular adenoma of colon    Past  Surgical History:  Procedure Laterality Date   ABDOMINAL HYSTERECTOMY  1978   BACK SURGERY  July 2012   BACTERIAL OVERGROWTH TEST N/A 05/05/2013   Procedure: BACTERIAL OVERGROWTH TEST;  Surgeon: Corbin Ade, MD;  Location: [facility];  Service: Endoscopy;  Laterality: N/A;  7:30   BIOPSY THYROID  2009   BRAIN SURGERY  11/2011   resection of meningioma   BREAST REDUCTION SURGERY  1994   CARDIAC CATHETERIZATION  05/10/2005   normal coronaries, normal LV systolic function and EF (Dr. Evlyn Courier)   CARPAL TUNNEL RELEASE Left 07/22/04   Dr. Romeo Apple   CATARACT EXTRACTION Bilateral    CHOLECYSTECTOMY  1984   COLONOSCOPY N/A 09/25/2012   MWN:[id number] diverticulosis.  colonic polyp-removed : tubular adenoma   CRANIOTOMY  11/23/2011   Procedure: CRANIOTOMY TUMOR EXCISION;  Surgeon: Hewitt Shorts, MD;  Location: [facility]URO OR[facility];  Service: Neurosurgery;  Laterality: N/A;  Craniotomy for tumor resection   ESOPHAGOGASTRODUODENOSCOPY  12/29/2010   Rourk-Retained food in the esophagus and stomach, small hiatal hernia, status post Maloney dilation of the esophagus   ESOPHAGOGASTRODUODENOSCOPY N/A 09/25/2012   UYQ:[id number] atonic baggy esophagus status post Maloney dilation 56 F. Hiatal hernia   GIVENS CAPSULE STUDY N/A 01/15/2013   NORMAL.    IR GENERIC HISTORICAL  03/17/2016   IR RADIOLOGIST EVAL & MGMT 03/17/2016 [facility] RAD   LESION REMOVAL N/A 05/31/2015   Procedure: REMOVAL RIGHT AND LEFT LESIONS OF MANDIBLE;  Surgeon: Ocie Doyne, DDS;  Location: [facility] OR;  Service: Oral Surgery;  Laterality: N/A;   MALONEY DILATION  12/29/2010   RMR;   NM MYOCAR PERF WALL MOTION  2006   "relavtiely normal" persantine, mild anterior thinning (breast attenuation artifact), no region of scar/ischemia   OVARIAN CYST REMOVAL     RECTOCELE REPAIR N/A 06/29/2015   Procedure: POSTERIOR REPAIR (RECTOCELE);  Surgeon: Tilda Burrow, MD;  Location: [facility] ORS;  Service: Gynecology;  Laterality: N/A;   REDUCTION MAMMAPLASTY  Bilateral    REVERSE SHOULDER ARTHROPLASTY Left 08/18/2022   Procedure: REVERSE SHOULDER ARTHROPLASTY;  Surgeon: Beverely Low, MD;  Location: [facility] ORS;  Service: Orthopedics;  Laterality: Left;  choice with interscalene, general   SPINE SURGERY  09/29/2010   Dr. Shon Baton   surgical excision of  3 tumors from right thigh and right buttock  and left upper thigh  2010   TOOTH EXTRACTION Bilateral 12/14/2014   Procedure: REMOVAL OF BILATERAL MANDIBULAR EXOSTOSES;  Surgeon: Ocie Doyne, DDS;  Location: [facility] OR;  Service: Oral Surgery;  Laterality: Bilateral;   TRANSTHORACIC ECHOCARDIOGRAM  2010   EF 60-65%, mild conc LVH, grade 1 diastolic dysfunction; mildly calcified MV annulus with mildly thickened leaflets, mildly calcified MR annulus    SOCIAL HISTORY:  Social History   Socioeconomic History   Marital status: Legally Separated    Spouse name: Not on file   Number of children: 1   Years of education: 12   Highest education level: High school graduate  Occupational History   Occupation: Disabled  Tobacco Use   Smoking status: Former    Current packs/day: 0.00    Average packs/day: 0.5 packs/day for 47.0 years (23.5 ttl pk-yrs)    Types: Cigarettes    Start date: 07/06/1974    Quit date: 07/05/2021    Years since quitting: 1.3    Passive exposure: Past   Smokeless tobacco: Never   Tobacco comments:    3-4 a day   Vaping Use   Vaping status: Never Used  Substance and Sexual Activity   Alcohol use: No    Alcohol/week: 0.0 standard drinks of alcohol    Comment:     Drug use: No   Sexual activity: Not Currently  Other Topics Concern   Not on file  Social History Narrative   01/29/18 Lives alone, has 3 aides, Mon- Fri 8 hrs, 2 hrs on Sat-Sun, RN manages her meds   Caffeine use: Drink coffee sometimes    Right handed    Social Determinants of Health   Financial Resource Strain: Low Risk  (07/17/2022)   Overall Financial Resource Strain (CARDIA)    Difficulty of Paying Living  Expenses: Not very hard  Food Insecurity: No Food Insecurity (10/30/2022)   Hunger Vital Sign    Worried About Running Out of Food in the Last Year: Never true    Ran Out of Food in the Last Year: Never true  Transportation Needs: No Transportation Needs (10/30/2022)   PRAPARE - Administrator, Civil Service (Medical): No    Lack of Transportation (Non-Medical): No  Physical Activity: Insufficiently Active (07/17/2022)   Exercise Vital Sign    Days of Exercise per Week: 2 days    Minutes of Exercise per Session: 20 min  Stress: No Stress Concern Present (07/17/2022)   [employer] of Occupational Health - Occupational Stress Questionnaire    Feeling of Stress : Not at all  Social Connections: Moderately Integrated (07/17/2022)   Social Connection and Isolation Panel [NHANES]    Frequency of Communication with Friends and Family: Three times a week    Frequency of Social Gatherings with Friends and Family: Once a week    Attends Religious Services: More than 4 times per year    Active Member of [employer] or Organizations: Yes    Attends Engineer, structural: More than 4 times per year    Marital Status: Divorced  Intimate Partner Violence: Not At Risk (08/20/2022)   Humiliation, Afraid, Rape, and Kick questionnaire    Fear of Current or Ex-Partner: No    Emotionally Abused: No    Physically Abused: No    Sexually Abused: No    FAMILY HISTORY:  Family History  Problem Relation Age of Onset   Heart attack Mother  HTN   Pneumonia Father    Kidney failure Father    Diabetes Father    Pancreatic cancer Sister    Cancer Sister        breast    Cancer Sister        pancreatic   Diabetes Brother    Hypertension Brother    Diabetes Brother    Alcohol abuse Maternal Uncle    Stroke Maternal Grandmother    Heart attack Maternal Grandfather    Hypertension Son    Sleep apnea Son    Colon cancer Neg Hx    Anesthesia problems Neg Hx    Hypotension Neg Hx     Malignant hyperthermia Neg Hx    Pseudochol deficiency Neg Hx    Breast cancer Neg Hx    Stomach cancer Neg Hx     CURRENT MEDICATIONS:  Outpatient Encounter Medications as of 11/14/2022  Medication Sig Note   ACCU-CHEK GUIDE test strip USE TO CHECK BLOOD SUGAR FOUR TIMES A DAY AND AS NEEDED (NEED MD APPOINTMENT FOR ANY FURTHER REFILLS)    Accu-Chek Softclix Lancets lancets TEST BLOOD SUGAR THREE TIMES DAILY AS DIRECTED    acetaminophen (TYLENOL) 325 MG tablet Take 650 mg by mouth every 6 (six) hours as needed for mild pain, moderate pain or headache.    albuterol (VENTOLIN HFA) 108 (90 Base) MCG/ACT inhaler INHALE 1 TO 2 PUFFS EVERY 6 HOURS AS NEEDED FOR WHEEZING, SHORTNESS OF BREATH    Alcohol Swabs (DROPSAFE ALCOHOL PREP) 70 % PADS USE TO CLEAN FINGER PRIOR TO TESTING FOR BLOOD SUGAR AS DIRECTED    alendronate (FOSAMAX) 70 MG tablet TAKE 1 TABLET EVERY 7 DAYS ON AN EMPTY STOMACH WITH A FULL GLASS OF WATER 08/08/2022: Preferred day of the week not stated   amLODipine (NORVASC) 10 MG tablet TAKE ONE TABLET BY MOUTH ONCE DAILY.    aspirin EC 81 MG tablet Take 1 tablet (81 mg total) by mouth daily with breakfast.    Blood Glucose Calibration (ACCU-CHEK GUIDE CONTROL) LIQD USE AS DIRECTED    blood glucose meter kit and supplies Dispense based on patient and insurance preference. Use up to four times daily as directed. (FOR ICD-10 E10.9, E11.9).    buPROPion (WELLBUTRIN XL) 150 MG 24 hr tablet Take 1 tablet (150 mg total) by mouth every morning.    Calcium Carb-Cholecalciferol (CALTRATE 600+D3 SOFT) 600-20 MG-MCG CHEW Chew 1 tablet by mouth daily.    carvedilol (COREG) 12.5 MG tablet Take 12.5 mg by mouth 2 (two) times daily with a meal.    Cholecalciferol (D3 PO) Take 1 tablet by mouth daily. 08/08/2022: Strength not noted   clobetasol (TEMOVATE) 0.05 % external solution Apply 1 Application topically 2 (two) times daily. (Patient taking differently: Apply 1 Application topically See admin  instructions. Apply to affected area(s) 2 times a day)    Continuous Blood Gluc Sensor (FREESTYLE LIBRE 14 DAY SENSOR) MISC 1 each by Does not apply route every 14 (fourteen) days. Change every 2 weeks    Docusate Sodium (DSS) 100 MG CAPS Take 100 mg by mouth in the morning and at bedtime.    DROPLET PEN NEEDLES 31G X 8 MM MISC USE FOR INJECTING INSULIN 4 TIMES DAILY.    Elastic Bandages & Supports (ADJUSTABLE ARM SLING) MISC L arm sling    ezetimibe (ZETIA) 10 MG tablet TAKE 1 TABLET EVERY DAY    hydrochlorothiazide (MICROZIDE) 12.5 MG capsule Take 12.5 mg by mouth daily.    insulin  aspart (NOVOLOG FLEXPEN) 100 UNIT/ML FlexPen Inject 5-8 Units into the skin 3 (three) times daily with meals.    insulin glargine, 2 Unit Dial, (TOUJEO MAX SOLOSTAR) 300 UNIT/ML Solostar Pen Inject 20-26 Units into the skin at bedtime. (Patient taking differently: Inject 23 Units into the skin at bedtime.)    ipratropium (ATROVENT) 0.03 % nasal spray Place 2 sprays into both nostrils every 12 (twelve) hours.    ketoconazole (NIZORAL) 2 % shampoo Apply 1 Application topically 2 (two) times a week.    lamoTRIgine (LAMICTAL) 100 MG tablet Take 1 tablet (100 mg total) by mouth 2 (two) times daily.    levothyroxine (SYNTHROID) 50 MCG tablet Take 1 tablet (50 mcg total) by mouth See admin instructions. Take 25 mcg by mouth before breakfast on Sunday and 50 mcg on Mon/Tues/Wed/Thurs/Fri/Sat    lidocaine (HM LIDOCAINE PATCH) 4 % Place 1 patch onto the skin daily.    LORazepam (ATIVAN) 0.5 MG tablet Take 1 tablet (0.5 mg total) by mouth 2 (two) times daily.    metoprolol tartrate (LOPRESSOR) 50 MG tablet TAKE 1 TABLET TWICE DAILY (NEED MD APPOINTMENT)    Misc. Devices (MATTRESS PAD) MISC FIRM MATTRESS PAD for hospital bed x 1    montelukast (SINGULAIR) 10 MG tablet Take 10 mg by mouth daily.    Multiple Vitamins-Minerals (MULTIVITAMIN GUMMIES ADULT PO) Take 1 tablet by mouth daily.    MYRBETRIQ 25 MG TB24 tablet TAKE 1 TABLET  EVERY DAY    nicotine (NICODERM CQ) 21 mg/24hr patch Place 1 patch (21 mg total) onto the skin daily.    ondansetron (ZOFRAN) 4 MG tablet Take 1 tablet (4 mg total) by mouth every 8 (eight) hours as needed for nausea or vomiting.    potassium chloride (KLOR-CON) 10 MEQ tablet Take 1 tablet (10 mEq total) by mouth 2 (two) times daily.    pregabalin (LYRICA) 75 MG capsule Take 75 mg by mouth daily.    RABEprazole (ACIPHEX) 20 MG tablet TAKE 1 TABLET TWICE DAILY    RESTASIS 0.05 % ophthalmic emulsion Place 2 drops into both eyes daily.    risperiDONE (RISPERDAL) 0.5 MG tablet Take 0.5 mg by mouth at bedtime.    rosuvastatin (CRESTOR) 5 MG tablet TAKE 1 TABLET AT BEDTIME    sertraline (ZOLOFT) 100 MG tablet Take 1 tablet (100 mg total) by mouth every morning.    spironolactone (ALDACTONE) 50 MG tablet TAKE 1 TABLET (50 MG TOTAL) BY MOUTH DAILY. DISCONTINUE SPIRONOLACTONE 25MG     sulfamethoxazole-trimethoprim (BACTRIM DS) 800-160 MG tablet Take 1 tablet by mouth 2 (two) times daily.    telmisartan (MICARDIS) 20 MG tablet Take 10 mg by mouth daily.    traZODone (DESYREL) 150 MG tablet Take 1 tablet (150 mg total) by mouth at bedtime.    umeclidinium-vilanterol (ANORO ELLIPTA) 62.5-25 MCG/ACT AEPB Inhale 1 puff into the lungs daily.    UNABLE TO FIND Med Name:  G5 Mattress    vitamin E 180 MG (400 UNITS) capsule Take 400 Units by mouth daily.    [DISCONTINUED] RABEprazole (ACIPHEX) 20 MG tablet TAKE 1 TABLET TWICE DAILY    No facility-administered encounter medications on file as of 11/14/2022.    ALLERGIES:  Allergies  Allergen Reactions   Iron Itching and Nausea And Vomiting   Milk (Cow) Rash and Other (See Comments)    Doesn't agree with the stomach, also    Penicillins Hives   Phenazopyridine Hives   Cephalexin Hives   Flonase [Fluticasone] Other (See  Comments)    "It gave me ulcers in my nose"   Milk-Related Compounds Nausea Only and Other (See Comments)    Doesn't agree with the  stomach    LABORATORY DATA:  I have reviewed the labs as listed.  CBC    Component Value Date/Time   WBC 6.5 11/14/2022 0938   RBC 4.25 11/14/2022 0938   HGB 9.7 (L) 11/14/2022 0938   HGB 11.0 (L) 06/29/2022 1150   HCT 34.3 (L) 11/14/2022 0938   HCT 38.8 06/29/2022 1150   PLT 232 11/14/2022 0938   PLT 279 06/29/2022 1150   MCV 80.7 11/14/2022 0938   MCV 80 06/29/2022 1150   MCH 22.8 (L) 11/14/2022 0938   MCHC 28.3 (L) 11/14/2022 0938   RDW 14.3 11/14/2022 0938   RDW 14.4 06/29/2022 1150   LYMPHSABS 1.4 11/14/2022 0938   LYMPHSABS 2.3 06/29/2022 1150   MONOABS 0.7 11/14/2022 0938   EOSABS 0.2 11/14/2022 0938   EOSABS 0.2 06/29/2022 1150   BASOSABS 0.0 11/14/2022 0938   BASOSABS 0.1 06/29/2022 1150      Latest Ref Rng & Units 11/14/2022    9:38 AM 10/26/2022    2:26 AM 08/24/2022    4:20 AM  CMP  Glucose 70 - 99 mg/dL 75  528  92   BUN 8 - 23 mg/dL 26  21  38   Creatinine 0.44 - 1.00 mg/dL 4.13  2.44  0.10   Sodium 135 - 145 mmol/L 141  140  136   Potassium 3.5 - 5.1 mmol/L 3.9  3.9  5.1   Chloride 98 - 111 mmol/L 107  108  107   CO2 22 - 32 mmol/L 23  22  21    Calcium 8.9 - 10.3 mg/dL 8.7  9.1  8.6   Total Protein 6.5 - 8.1 g/dL 6.7     Total Bilirubin 0.3 - 1.2 mg/dL 0.3     Alkaline Phos 38 - 126 U/L 82     AST 15 - 41 U/L 16     ALT 0 - 44 U/L 17       DIAGNOSTIC IMAGING:  I have independently reviewed the relevant imaging and discussed with the patient.   WRAP UP:  All questions were answered. The patient knows to call the clinic with any problems, questions or concerns.  Medical decision making: Moderate  Time spent on visit: I spent 20 minutes counseling the patient face to face. The total time spent in the appointment was 30 minutes and more than 50% was on counseling.  Carnella Guadalajara, PA-C  11/14/22 10:40 AM

## 2022-11-14 ENCOUNTER — Inpatient Hospital Stay (HOSPITAL_BASED_OUTPATIENT_CLINIC_OR_DEPARTMENT_OTHER): Payer: Medicare HMO | Admitting: Physician Assistant

## 2022-11-14 ENCOUNTER — Inpatient Hospital Stay: Payer: Medicare HMO

## 2022-11-14 ENCOUNTER — Inpatient Hospital Stay: Payer: Medicare HMO | Attending: Hematology

## 2022-11-14 VITALS — BP 111/68 | HR 61 | Temp 99.0°F | Resp 18 | Ht 59.0 in | Wt 166.2 lb

## 2022-11-14 DIAGNOSIS — G4730 Sleep apnea, unspecified: Secondary | ICD-10-CM | POA: Diagnosis not present

## 2022-11-14 DIAGNOSIS — D509 Iron deficiency anemia, unspecified: Secondary | ICD-10-CM | POA: Insufficient documentation

## 2022-11-14 DIAGNOSIS — D631 Anemia in chronic kidney disease: Secondary | ICD-10-CM

## 2022-11-14 DIAGNOSIS — Z87891 Personal history of nicotine dependence: Secondary | ICD-10-CM | POA: Insufficient documentation

## 2022-11-14 DIAGNOSIS — Z800 Family history of malignant neoplasm of digestive organs: Secondary | ICD-10-CM | POA: Insufficient documentation

## 2022-11-14 DIAGNOSIS — M797 Fibromyalgia: Secondary | ICD-10-CM | POA: Diagnosis not present

## 2022-11-14 DIAGNOSIS — E1122 Type 2 diabetes mellitus with diabetic chronic kidney disease: Secondary | ICD-10-CM | POA: Diagnosis not present

## 2022-11-14 DIAGNOSIS — N1831 Chronic kidney disease, stage 3a: Secondary | ICD-10-CM | POA: Insufficient documentation

## 2022-11-14 DIAGNOSIS — K219 Gastro-esophageal reflux disease without esophagitis: Secondary | ICD-10-CM | POA: Insufficient documentation

## 2022-11-14 DIAGNOSIS — E039 Hypothyroidism, unspecified: Secondary | ICD-10-CM | POA: Diagnosis not present

## 2022-11-14 DIAGNOSIS — F5089 Other specified eating disorder: Secondary | ICD-10-CM | POA: Diagnosis not present

## 2022-11-14 DIAGNOSIS — Z79899 Other long term (current) drug therapy: Secondary | ICD-10-CM | POA: Insufficient documentation

## 2022-11-14 DIAGNOSIS — E785 Hyperlipidemia, unspecified: Secondary | ICD-10-CM | POA: Diagnosis not present

## 2022-11-14 DIAGNOSIS — D329 Benign neoplasm of meninges, unspecified: Secondary | ICD-10-CM | POA: Diagnosis not present

## 2022-11-14 DIAGNOSIS — I129 Hypertensive chronic kidney disease with stage 1 through stage 4 chronic kidney disease, or unspecified chronic kidney disease: Secondary | ICD-10-CM | POA: Insufficient documentation

## 2022-11-14 DIAGNOSIS — Z794 Long term (current) use of insulin: Secondary | ICD-10-CM | POA: Diagnosis not present

## 2022-11-14 DIAGNOSIS — R519 Headache, unspecified: Secondary | ICD-10-CM | POA: Insufficient documentation

## 2022-11-14 DIAGNOSIS — D638 Anemia in other chronic diseases classified elsewhere: Secondary | ICD-10-CM

## 2022-11-14 DIAGNOSIS — Z803 Family history of malignant neoplasm of breast: Secondary | ICD-10-CM | POA: Insufficient documentation

## 2022-11-14 DIAGNOSIS — Z8673 Personal history of transient ischemic attack (TIA), and cerebral infarction without residual deficits: Secondary | ICD-10-CM | POA: Diagnosis not present

## 2022-11-14 LAB — COMPREHENSIVE METABOLIC PANEL
ALT: 17 U/L (ref 0–44)
AST: 16 U/L (ref 15–41)
Albumin: 3.7 g/dL (ref 3.5–5.0)
Alkaline Phosphatase: 82 U/L (ref 38–126)
Anion gap: 11 (ref 5–15)
BUN: 26 mg/dL — ABNORMAL HIGH (ref 8–23)
CO2: 23 mmol/L (ref 22–32)
Calcium: 8.7 mg/dL — ABNORMAL LOW (ref 8.9–10.3)
Chloride: 107 mmol/L (ref 98–111)
Creatinine, Ser: 1.1 mg/dL — ABNORMAL HIGH (ref 0.44–1.00)
GFR, Estimated: 53 mL/min — ABNORMAL LOW (ref >60)
Glucose, Bld: 75 mg/dL (ref 70–99)
Potassium: 3.9 mmol/L (ref 3.5–5.1)
Sodium: 141 mmol/L (ref 135–145)
Total Bilirubin: 0.3 mg/dL (ref 0.3–1.2)
Total Protein: 6.7 g/dL (ref 6.5–8.1)

## 2022-11-14 LAB — CBC WITH DIFFERENTIAL/PLATELET
Abs Immature Granulocytes: 0.05 10*3/uL (ref 0.00–0.07)
Basophils Absolute: 0 10*3/uL (ref 0.0–0.1)
Basophils Relative: 1 %
Eosinophils Absolute: 0.2 10*3/uL (ref 0.0–0.5)
Eosinophils Relative: 3 %
HCT: 34.3 % — ABNORMAL LOW (ref 36.0–46.0)
Hemoglobin: 9.7 g/dL — ABNORMAL LOW (ref 12.0–15.0)
Immature Granulocytes: 1 %
Lymphocytes Relative: 21 %
Lymphs Abs: 1.4 10*3/uL (ref 0.7–4.0)
MCH: 22.8 pg — ABNORMAL LOW (ref 26.0–34.0)
MCHC: 28.3 g/dL — ABNORMAL LOW (ref 30.0–36.0)
MCV: 80.7 fL (ref 80.0–100.0)
Monocytes Absolute: 0.7 10*3/uL (ref 0.1–1.0)
Monocytes Relative: 11 %
Neutro Abs: 4.2 10*3/uL (ref 1.7–7.7)
Neutrophils Relative %: 63 %
Platelets: 232 10*3/uL (ref 150–400)
RBC: 4.25 MIL/uL (ref 3.87–5.11)
RDW: 14.3 % (ref 11.5–15.5)
WBC: 6.5 10*3/uL (ref 4.0–10.5)
nRBC: 0 % (ref 0.0–0.2)

## 2022-11-14 LAB — IRON AND TIBC
Iron: 55 ug/dL (ref 28–170)
Saturation Ratios: 16 % (ref 10.4–31.8)
TIBC: 336 ug/dL (ref 250–450)
UIBC: 281 ug/dL

## 2022-11-14 LAB — FERRITIN: Ferritin: 165 ng/mL (ref 11–307)

## 2022-11-14 MED ORDER — EPOETIN ALFA 10000 UNIT/ML IJ SOLN
10000.0000 [IU] | Freq: Once | INTRAMUSCULAR | Status: AC
  Administered 2022-11-14: 10000 [IU] via SUBCUTANEOUS
  Filled 2022-11-14: qty 1

## 2022-11-14 NOTE — Progress Notes (Signed)
Message received from R. Pennington PA to give Epogen. HGB 9.7 today. [name] presents today for injection per the provider's orders.  Epogen 10,000 units administration without incident; injection site WNL; see MAR for injection details.  Patient tolerated procedure well and without incident.  No questions or complaints noted at this time. Treatment given today per MD orders. Tolerated without adverse affects. Vital signs stable. No complaints at this time. Discharged from clinic ambulatory  using walker in stable condition. Alert and oriented x 3. F/U with [facility] as scheduled.

## 2022-11-14 NOTE — Patient Instructions (Signed)
[facility] at [facility] Discharge Instructions  You were seen today by Rojelio Brenner PA-C for your anemia.  This is most likely related to your underlying chronic kidney disease.   We will decrease the frequency of your Retacrit shots to every 4 weeks (instead of every 3 weeks).  We will continue to check your blood count every 4 weeks on the same day as your injections.  MEDICATIONS: Retacrit injection every 4 weeks  FOLLOW-UP APPOINTMENT: Office visit in 3 months   Thank you for choosing [facility] at [facility] to provide your oncology and hematology care.  To afford each patient quality time with our provider, please arrive at least 15 minutes before your scheduled appointment time.   If you have a lab appointment with the Cancer Center please come in thru the Main Entrance and check in at the main information desk.  You need to re-schedule your appointment should you arrive 10 or more minutes late.  We strive to give you quality time with our providers, and arriving late affects you and other patients whose appointments are after yours.  Also, if you no show three or more times for appointments you may be dismissed from the clinic at the providers discretion.     Again, thank you for choosing [facility].  Our hope is that these requests will decrease the amount of time that you wait before being seen by our physicians.       _____________________________________________________________  Should you have questions after your visit to [facility], please contact our office at [phone] and follow the prompts.  Our office hours are 8:00 a.m. and 4:30 p.m. Monday - Friday.  Please note that voicemails left after 4:00 p.m. may not be returned until the following business day.  We are closed weekends and major holidays.  You do have access to a nurse 24-7, just call the main number to the clinic [phone] and  do not press any options, hold on the line and a nurse will answer the phone.    For prescription refill requests, have your pharmacy contact our office and allow 72 hours.

## 2022-11-16 ENCOUNTER — Telehealth: Payer: Self-pay | Admitting: Family Medicine

## 2022-11-16 ENCOUNTER — Other Ambulatory Visit: Payer: Self-pay | Admitting: Family Medicine

## 2022-11-16 ENCOUNTER — Ambulatory Visit: Payer: Medicare HMO | Admitting: Family Medicine

## 2022-11-16 ENCOUNTER — Other Ambulatory Visit: Payer: Self-pay

## 2022-11-16 DIAGNOSIS — Z1231 Encounter for screening mammogram for malignant neoplasm of breast: Secondary | ICD-10-CM

## 2022-11-16 DIAGNOSIS — F319 Bipolar disorder, unspecified: Secondary | ICD-10-CM | POA: Diagnosis not present

## 2022-11-16 DIAGNOSIS — I69328 Other speech and language deficits following cerebral infarction: Secondary | ICD-10-CM | POA: Diagnosis not present

## 2022-11-16 DIAGNOSIS — I69354 Hemiplegia and hemiparesis following cerebral infarction affecting left non-dominant side: Secondary | ICD-10-CM | POA: Diagnosis not present

## 2022-11-16 DIAGNOSIS — G839 Paralytic syndrome, unspecified: Secondary | ICD-10-CM | POA: Diagnosis not present

## 2022-11-16 DIAGNOSIS — I129 Hypertensive chronic kidney disease with stage 1 through stage 4 chronic kidney disease, or unspecified chronic kidney disease: Secondary | ICD-10-CM | POA: Diagnosis not present

## 2022-11-16 DIAGNOSIS — D631 Anemia in chronic kidney disease: Secondary | ICD-10-CM | POA: Diagnosis not present

## 2022-11-16 DIAGNOSIS — N189 Chronic kidney disease, unspecified: Secondary | ICD-10-CM | POA: Diagnosis not present

## 2022-11-16 DIAGNOSIS — Z471 Aftercare following joint replacement surgery: Secondary | ICD-10-CM | POA: Diagnosis not present

## 2022-11-16 DIAGNOSIS — E1122 Type 2 diabetes mellitus with diabetic chronic kidney disease: Secondary | ICD-10-CM | POA: Diagnosis not present

## 2022-11-16 MED ORDER — MIRABEGRON ER 25 MG PO TB24: 25.0000 mg | ORAL_TABLET | Freq: Every day | ORAL | 3 refills | Status: DC

## 2022-11-16 NOTE — Telephone Encounter (Signed)
Prescription Request  11/16/2022  LOV: 10/20/2022  What is the name of the medication or equipment?   MYRBETRIQ 25 MG TB24 tablet    Have you contacted your pharmacy to request a refill? No   Which pharmacy would you like this sent to?     [facility] - [place], [place] - [address] [place] [address] Phone: [phone]  Fax: [phone]    Patient notified that their request is being sent to the clinical staff for review and that they should receive a response within 2 business days.   Please advise at [facility] [phone]

## 2022-11-16 NOTE — Telephone Encounter (Signed)
Refill sent.

## 2022-11-20 DIAGNOSIS — I69354 Hemiplegia and hemiparesis following cerebral infarction affecting left non-dominant side: Secondary | ICD-10-CM | POA: Diagnosis not present

## 2022-11-20 DIAGNOSIS — E1122 Type 2 diabetes mellitus with diabetic chronic kidney disease: Secondary | ICD-10-CM | POA: Diagnosis not present

## 2022-11-20 DIAGNOSIS — D631 Anemia in chronic kidney disease: Secondary | ICD-10-CM | POA: Diagnosis not present

## 2022-11-20 DIAGNOSIS — G839 Paralytic syndrome, unspecified: Secondary | ICD-10-CM | POA: Diagnosis not present

## 2022-11-20 DIAGNOSIS — I69328 Other speech and language deficits following cerebral infarction: Secondary | ICD-10-CM | POA: Diagnosis not present

## 2022-11-20 DIAGNOSIS — Z471 Aftercare following joint replacement surgery: Secondary | ICD-10-CM | POA: Diagnosis not present

## 2022-11-20 DIAGNOSIS — I129 Hypertensive chronic kidney disease with stage 1 through stage 4 chronic kidney disease, or unspecified chronic kidney disease: Secondary | ICD-10-CM | POA: Diagnosis not present

## 2022-11-20 DIAGNOSIS — F319 Bipolar disorder, unspecified: Secondary | ICD-10-CM | POA: Diagnosis not present

## 2022-11-20 DIAGNOSIS — N189 Chronic kidney disease, unspecified: Secondary | ICD-10-CM | POA: Diagnosis not present

## 2022-11-21 ENCOUNTER — Telehealth: Payer: Self-pay | Admitting: Family Medicine

## 2022-11-21 DIAGNOSIS — N1831 Chronic kidney disease, stage 3a: Secondary | ICD-10-CM | POA: Diagnosis not present

## 2022-11-21 DIAGNOSIS — N3942 Incontinence without sensory awareness: Secondary | ICD-10-CM | POA: Diagnosis not present

## 2022-11-21 DIAGNOSIS — M5451 Vertebrogenic low back pain: Secondary | ICD-10-CM | POA: Diagnosis not present

## 2022-11-21 DIAGNOSIS — R809 Proteinuria, unspecified: Secondary | ICD-10-CM | POA: Diagnosis not present

## 2022-11-21 DIAGNOSIS — E1122 Type 2 diabetes mellitus with diabetic chronic kidney disease: Secondary | ICD-10-CM | POA: Diagnosis not present

## 2022-11-21 MED ORDER — ONDANSETRON HCL 4 MG PO TABS: ORAL_TABLET | ORAL | 1 refills | Status: DC

## 2022-11-21 NOTE — Telephone Encounter (Signed)
Zofran prescribed short term max 1 / day , if needs to take rthis regularly, needs GI eval, will address ar her October visit.. Yes on med for incontinence already, nothing more to add . Needs to bring ALL meds to visit

## 2022-11-21 NOTE — Telephone Encounter (Signed)
Patient called in requesting call back.  Patient wants to see if she is currently taking meds to help control bladder and if not would like to be placed on one  Also wants to see if she can get med for nausea   Patient wants a call back.

## 2022-11-22 ENCOUNTER — Emergency Department (HOSPITAL_COMMUNITY): Payer: Medicare HMO

## 2022-11-22 ENCOUNTER — Other Ambulatory Visit: Payer: Self-pay

## 2022-11-22 ENCOUNTER — Emergency Department (HOSPITAL_COMMUNITY)
Admission: EM | Admit: 2022-11-22 | Discharge: 2022-11-22 | Disposition: A | Discharge status: Home / Self Care | Payer: Medicare HMO

## 2022-11-22 ENCOUNTER — Telehealth: Payer: Self-pay

## 2022-11-22 ENCOUNTER — Encounter (HOSPITAL_COMMUNITY): Payer: Self-pay

## 2022-11-22 ENCOUNTER — Ambulatory Visit (HOSPITAL_COMMUNITY): Payer: Medicare HMO

## 2022-11-22 DIAGNOSIS — R400 Somnolence: Secondary | ICD-10-CM | POA: Insufficient documentation

## 2022-11-22 DIAGNOSIS — N390 Urinary tract infection, site not specified: Secondary | ICD-10-CM | POA: Diagnosis not present

## 2022-11-22 DIAGNOSIS — R5383 Other fatigue: Secondary | ICD-10-CM | POA: Diagnosis not present

## 2022-11-22 DIAGNOSIS — Z79899 Other long term (current) drug therapy: Secondary | ICD-10-CM | POA: Insufficient documentation

## 2022-11-22 DIAGNOSIS — J449 Chronic obstructive pulmonary disease, unspecified: Secondary | ICD-10-CM | POA: Diagnosis not present

## 2022-11-22 DIAGNOSIS — I959 Hypotension, unspecified: Secondary | ICD-10-CM | POA: Diagnosis not present

## 2022-11-22 DIAGNOSIS — I10 Essential (primary) hypertension: Secondary | ICD-10-CM | POA: Diagnosis not present

## 2022-11-22 DIAGNOSIS — Z7982 Long term (current) use of aspirin: Secondary | ICD-10-CM | POA: Diagnosis not present

## 2022-11-22 DIAGNOSIS — Z471 Aftercare following joint replacement surgery: Secondary | ICD-10-CM | POA: Diagnosis not present

## 2022-11-22 DIAGNOSIS — Z96612 Presence of left artificial shoulder joint: Secondary | ICD-10-CM | POA: Diagnosis not present

## 2022-11-22 DIAGNOSIS — G459 Transient cerebral ischemic attack, unspecified: Secondary | ICD-10-CM | POA: Diagnosis not present

## 2022-11-22 DIAGNOSIS — R0602 Shortness of breath: Secondary | ICD-10-CM | POA: Diagnosis not present

## 2022-11-22 DIAGNOSIS — Z794 Long term (current) use of insulin: Secondary | ICD-10-CM | POA: Diagnosis not present

## 2022-11-22 DIAGNOSIS — R531 Weakness: Secondary | ICD-10-CM | POA: Diagnosis not present

## 2022-11-22 DIAGNOSIS — G9389 Other specified disorders of brain: Secondary | ICD-10-CM | POA: Diagnosis not present

## 2022-11-22 DIAGNOSIS — Z1152 Encounter for screening for COVID-19: Secondary | ICD-10-CM | POA: Insufficient documentation

## 2022-11-22 LAB — URINALYSIS, ROUTINE W REFLEX MICROSCOPIC
Bacteria, UA: NONE SEEN
Bilirubin Urine: NEGATIVE
Glucose, UA: NEGATIVE mg/dL
Ketones, ur: NEGATIVE mg/dL
Leukocytes,Ua: NEGATIVE
Nitrite: NEGATIVE
Protein, ur: NEGATIVE mg/dL
Specific Gravity, Urine: 1.014 (ref 1.005–1.030)
pH: 6 (ref 5.0–8.0)

## 2022-11-22 LAB — BASIC METABOLIC PANEL
Anion gap: 7 (ref 5–15)
BUN: 13 mg/dL (ref 8–23)
CO2: 28 mmol/L (ref 22–32)
Calcium: 8.8 mg/dL — ABNORMAL LOW (ref 8.9–10.3)
Chloride: 105 mmol/L (ref 98–111)
Creatinine, Ser: 0.85 mg/dL (ref 0.44–1.00)
GFR, Estimated: >60 mL/min (ref >60)
Glucose, Bld: 166 mg/dL — ABNORMAL HIGH (ref 70–99)
Potassium: 3.8 mmol/L (ref 3.5–5.1)
Sodium: 140 mmol/L (ref 135–145)

## 2022-11-22 LAB — TSH: TSH: 0.681 u[IU]/mL (ref 0.350–4.500)

## 2022-11-22 LAB — CBC WITH DIFFERENTIAL/PLATELET
Abs Immature Granulocytes: 0.02 10*3/uL (ref 0.00–0.07)
Basophils Absolute: 0 10*3/uL (ref 0.0–0.1)
Basophils Relative: 0 %
Eosinophils Absolute: 0 10*3/uL (ref 0.0–0.5)
Eosinophils Relative: 1 %
HCT: 34 % — ABNORMAL LOW (ref 36.0–46.0)
Hemoglobin: 9.9 g/dL — ABNORMAL LOW (ref 12.0–15.0)
Immature Granulocytes: 0 %
Lymphocytes Relative: 12 %
Lymphs Abs: 0.8 10*3/uL (ref 0.7–4.0)
MCH: 23 pg — ABNORMAL LOW (ref 26.0–34.0)
MCHC: 29.1 g/dL — ABNORMAL LOW (ref 30.0–36.0)
MCV: 79.1 fL — ABNORMAL LOW (ref 80.0–100.0)
Monocytes Absolute: 0.4 10*3/uL (ref 0.1–1.0)
Monocytes Relative: 6 %
Neutro Abs: 5.2 10*3/uL (ref 1.7–7.7)
Neutrophils Relative %: 81 %
Platelets: 208 10*3/uL (ref 150–400)
RBC: 4.3 MIL/uL (ref 3.87–5.11)
RDW: 14.6 % (ref 11.5–15.5)
WBC: 6.5 10*3/uL (ref 4.0–10.5)
nRBC: 0 % (ref 0.0–0.2)

## 2022-11-22 LAB — BLOOD GAS, VENOUS
Acid-Base Excess: 4.2 mmol/L — ABNORMAL HIGH (ref 0.0–2.0)
Bicarbonate: 30.2 mmol/L — ABNORMAL HIGH (ref 20.0–28.0)
Drawn by: 51174
FIO2: 21 %
O2 Saturation: 49.5 %
Patient temperature: 36.7
pCO2, Ven: 50 mmHg (ref 44–60)
pH, Ven: 7.38 (ref 7.25–7.43)
pO2, Ven: <31 mmHg — CL (ref 32–45)

## 2022-11-22 LAB — BRAIN NATRIURETIC PEPTIDE: B Natriuretic Peptide: 180 pg/mL — ABNORMAL HIGH (ref 0.0–100.0)

## 2022-11-22 LAB — RESP PANEL BY RT-PCR (RSV, FLU A&B, COVID)  RVPGX2
Influenza A by PCR: NEGATIVE
Influenza B by PCR: NEGATIVE
Resp Syncytial Virus by PCR: NEGATIVE
SARS Coronavirus 2 by RT PCR: NEGATIVE

## 2022-11-22 LAB — AMMONIA: Ammonia: 14 umol/L (ref 9–35)

## 2022-11-22 MED ORDER — IPRATROPIUM-ALBUTEROL 0.5-2.5 (3) MG/3ML IN SOLN
3.0000 mL | Freq: Once | RESPIRATORY_TRACT | Status: AC
  Administered 2022-11-22: 3 mL via RESPIRATORY_TRACT
  Filled 2022-11-22: qty 3

## 2022-11-22 NOTE — Progress Notes (Signed)
Care Guide Note  11/22/2022 Name: KRISTE IIAMS MRN: [id number] DOB: 1950/11/08  Referred by: Kerri Perches, MD Reason for referral : Care Coordination (Outreach to schedule with Pharm d )   Azura Jehle Oriley is a 72 y.o. year old female who is a primary care patient of Lodema Hong Milus Mallick, MD. Weyman Pedro Coutant was referred to the pharmacist for assistance related to DM.    An unsuccessful telephone outreach was attempted today to contact the patient who was referred to the pharmacy team for assistance with medication management. Additional attempts will be made to contact the patient.   Penne Lash, RMA Care Guide [facility]  [place], [place] [address] Direct Dial: [phone] [email or web address]

## 2022-11-22 NOTE — ED Triage Notes (Signed)
Per EMS, Pt, from home, c/o weakness and fatigue x2 days.  Denies pain and n/v/d.  Pt's HH RN called EMS.  Pt is normally A&Ox4 and very active.    EMS reports Pt was diagnosed w/ a UTI x "a few days ago," but was never started on an antibiotic.    Vitals and CBG WDL.

## 2022-11-22 NOTE — ED Provider Notes (Signed)
[place] EMERGENCY DEPARTMENT AT [facility] Provider Note   CSN: [id number] Arrival date & time: 11/22/22  1014     History  Chief Complaint  Patient presents with   Weakness   Fatigue    [name] is a 72 y.o. female.  72 year old female with past medical history of diabetes, COPD, and morbid obesity presenting to the emergency department today with altered mental status.  The patient was sent in by her home health care nurse for some decreased mentation this morning.  The patient does have a history of COPD.  There were no reports of any infectious symptoms.  The patient actually tried to decline being brought into the emergency department but medics were concerned that she kept falling asleep and were unsure about capacity so she was brought in for further evaluation.  The patient is sonorous here on arrival but will wake up to verbal stimuli.  She will answer yes or no questions and seems to be answering appropriately.  She is denying any complaints currently.  Without continuous verbal stimulation she will fall back asleep.   Weakness      Home Medications Prior to Admission medications   Medication Sig Start Date End Date Taking? Authorizing Provider  albuterol (VENTOLIN HFA) 108 (90 Base) MCG/ACT inhaler INHALE 1 TO 2 PUFFS EVERY 6 HOURS AS NEEDED FOR WHEEZING, SHORTNESS OF BREATH Patient taking differently: Inhale 1-2 puffs into the lungs every 6 (six) hours as needed for wheezing or shortness of breath. 10/27/22  Yes Young, Joni Fears D, MD  Alcohol Swabs (DROPSAFE ALCOHOL PREP) 70 % PADS USE TO CLEAN FINGER PRIOR TO TESTING FOR BLOOD SUGAR AS DIRECTED 05/10/22  Yes Carlus Pavlov, MD  alendronate (FOSAMAX) 70 MG tablet TAKE 1 TABLET EVERY 7 DAYS ON AN EMPTY STOMACH WITH A FULL GLASS OF WATER Patient taking differently: Take 70 mg by mouth once a week. 11/07/21  Yes Kerri Perches, MD  amLODipine (NORVASC) 10 MG tablet TAKE ONE TABLET BY MOUTH ONCE DAILY.  10/24/22  Yes Kerri Perches, MD  aspirin EC 81 MG tablet Take 1 tablet (81 mg total) by mouth daily with breakfast. 12/08/18  Yes Emokpae, Courage, MD  buPROPion (WELLBUTRIN XL) 150 MG 24 hr tablet Take 1 tablet (150 mg total) by mouth every morning. 10/04/22 10/04/23 Yes Myrlene Broker, MD  carvedilol (COREG) 12.5 MG tablet Take 12.5 mg by mouth 2 (two) times daily with a meal.   Yes [provider]  Cholecalciferol (D3 PO) Take 1 tablet by mouth daily.   Yes [provider]  Docusate Sodium (DSS) 100 MG CAPS Take 100 mg by mouth in the morning and at bedtime.   Yes [provider]  ezetimibe (ZETIA) 10 MG tablet TAKE 1 TABLET EVERY DAY 01/19/22  Yes Kerri Perches, MD  hydrochlorothiazide (MICROZIDE) 12.5 MG capsule Take 12.5 mg by mouth daily. 09/27/22  Yes [provider]  insulin aspart (NOVOLOG FLEXPEN) 100 UNIT/ML FlexPen Inject 5-8 Units into the skin 3 (three) times daily with meals. 11/03/22  Yes Carlus Pavlov, MD  insulin glargine, 2 Unit Dial, (TOUJEO MAX SOLOSTAR) 300 UNIT/ML Solostar Pen Inject 20-26 Units into the skin at bedtime. Patient taking differently: Inject 23 Units into the skin at bedtime. 10/03/22  Yes Carlus Pavlov, MD  ketoconazole (NIZORAL) 2 % shampoo Apply 1 Application topically 2 (two) times a week. 10/16/22  Yes Kerri Perches, MD  lamoTRIgine (LAMICTAL) 100 MG tablet Take 1 tablet (100  mg total) by mouth 2 (two) times daily. 10/04/22  Yes Myrlene Broker, MD  levothyroxine (SYNTHROID) 50 MCG tablet TAKE 1 TABLET DAILY SIX DAYS A WEEK AND 1/2 TABLET ON ONE DAY A WEEK Patient taking differently: Take 50 mcg by mouth See admin instructions. Take 50 mg by mouth for 6 days a week and 25 mg on one day a week 11/15/22  Yes Carlus Pavlov, MD  LORazepam (ATIVAN) 0.5 MG tablet Take 1 tablet (0.5 mg total) by mouth 2 (two) times daily. 10/04/22 10/04/23 Yes Myrlene Broker, MD  mirabegron ER (MYRBETRIQ) 25 MG TB24 tablet Take 1  tablet (25 mg total) by mouth daily. 11/16/22  Yes Kerri Perches, MD  montelukast (SINGULAIR) 10 MG tablet Take 10 mg by mouth daily. 10/27/22  Yes [provider]  Multiple Vitamins-Minerals (MULTIVITAMIN GUMMIES ADULT PO) Take 1 tablet by mouth daily.   Yes [provider]  nicotine (NICODERM CQ) 21 mg/24hr patch Place 1 patch (21 mg total) onto the skin daily. 06/20/22  Yes Jetty Duhamel D, MD  ondansetron [facility]) 4 MG tablet Take one tablet by mouth once daily, as needed , for nausea 11/21/22  Yes Kerri Perches, MD  pregabalin (LYRICA) 75 MG capsule Take 75 mg by mouth daily.   Yes [provider]  RABEprazole (ACIPHEX) 20 MG tablet TAKE 1 TABLET TWICE DAILY 11/14/22  Yes Pyrtle, Carie Caddy, MD  RESTASIS 0.05 % ophthalmic emulsion Place 2 drops into both eyes daily. 07/29/20  Yes [provider]  risperiDONE (RISPERDAL) 0.5 MG tablet Take 0.5 mg by mouth at bedtime.   Yes [provider]  rosuvastatin (CRESTOR) 5 MG tablet TAKE 1 TABLET AT BEDTIME 06/12/22  Yes Kerri Perches, MD  sertraline (ZOLOFT) 100 MG tablet Take 1 tablet (100 mg total) by mouth every morning. 10/04/22  Yes Myrlene Broker, MD  spironolactone (ALDACTONE) 50 MG tablet TAKE 1 TABLET (50 MG TOTAL) BY MOUTH DAILY. DISCONTINUE SPIRONOLACTONE 25MG  Patient taking differently: Take 50 mg by mouth daily. Discontinue Spironolactone 25 mg 04/18/22  Yes Kerri Perches, MD  telmisartan (MICARDIS) 20 MG tablet Take 10 mg by mouth daily. 11/23/21  Yes [provider]  traZODone (DESYREL) 150 MG tablet Take 1 tablet (150 mg total) by mouth at bedtime. 10/04/22  Yes Myrlene Broker, MD  vitamin E 180 MG (400 UNITS) capsule Take 400 Units by mouth daily.   Yes [provider]  ACCU-CHEK GUIDE test strip USE TO CHECK BLOOD SUGAR FOUR TIMES A DAY AND AS NEEDED (NEED MD APPOINTMENT FOR ANY FURTHER REFILLS) 05/10/22   Carlus Pavlov, MD  Accu-Chek Softclix Lancets lancets TEST  BLOOD SUGAR THREE TIMES DAILY AS DIRECTED 03/28/21   Carlus Pavlov, MD  acetaminophen (TYLENOL) 325 MG tablet Take 650 mg by mouth every 6 (six) hours as needed for mild pain, moderate pain or headache.    [provider]  Blood Glucose Calibration (ACCU-CHEK GUIDE CONTROL) LIQD USE AS DIRECTED 12/13/20   Carlus Pavlov, MD  blood glucose meter kit and supplies Dispense based on patient and insurance preference. Use up to four times daily as directed. (FOR ICD-10 E10.9, E11.9). 06/27/19   Carlus Pavlov, MD  Continuous Blood Gluc Sensor (FREESTYLE LIBRE 14 DAY SENSOR) MISC 1 each by Does not apply route every 14 (fourteen) days. Change every 2 weeks 06/24/19   Carlus Pavlov, MD  DROPLET PEN NEEDLES 31G X 8 MM MISC USE FOR INJECTING INSULIN 4 TIMES DAILY. 09/02/21  Carlus Pavlov, MD  Elastic Bandages & Supports (ADJUSTABLE ARM SLING) MISC L arm sling 05/04/22   Kerri Perches, MD  ipratropium (ATROVENT) 0.03 % nasal spray Place 2 sprays into both nostrils every 12 (twelve) hours. Patient not taking: Reported on 11/22/2022 01/16/22   Glenford Bayley, NP  metoprolol tartrate (LOPRESSOR) 50 MG tablet TAKE 1 TABLET TWICE DAILY (NEED MD APPOINTMENT) 10/24/22   Quintella Reichert, MD  Misc. Devices (MATTRESS PAD) MISC FIRM MATTRESS PAD for hospital bed x 1 02/22/21   Kerri Perches, MD  potassium chloride (KLOR-CON) 10 MEQ tablet Take 1 tablet (10 mEq total) by mouth 2 (two) times daily. Patient not taking: Reported on 11/22/2022 02/01/22   Kerri Perches, MD  UNABLE TO FIND Med Name:  G5 Mattress 07/31/22   Kerri Perches, MD      Allergies    Iron, Milk (cow), Penicillins, Phenazopyridine, Cephalexin, Flonase [fluticasone], and Milk-related compounds    Review of Systems   Review of Systems  Neurological:  Positive for weakness.  All other systems reviewed and are negative.   Physical Exam Updated Vital Signs BP (!) 159/62   Pulse 71   Temp 98.1 F (36.7  C) (Rectal)   Resp (!) 21   Ht 4\' 11"  (1.499 m)   Wt 75.3 kg   SpO2 96%   BMI 33.53 kg/m  Physical Exam Vitals and nursing note reviewed.   Gen: Sonorous, arousable to verbal stimuli Eyes: PERRL, EOMI HEENT: no oropharyngeal swelling Neck: trachea midline Resp: Diminished bilaterally with faint wheezes noted Card: RRR, no murmurs, rubs, or gallops Abd: nontender, nondistended Extremities: no calf tenderness, no edema Vascular: 2+ radial pulses bilaterally, 2+ DP pulses bilaterally Skin: no rashes Psyc: acting appropriately   ED Results / Procedures / Treatments   Labs (all labs ordered are listed, but only abnormal results are displayed) Labs Reviewed  BASIC METABOLIC PANEL - Abnormal; Notable for the following components:      Result Value   Glucose, Bld 166 (*)    Calcium 8.8 (*)    All other components within normal limits  BRAIN NATRIURETIC PEPTIDE - Abnormal; Notable for the following components:   B Natriuretic Peptide 180.0 (*)    All other components within normal limits  CBC WITH DIFFERENTIAL/PLATELET - Abnormal; Notable for the following components:   Hemoglobin 9.9 (*)    HCT 34.0 (*)    MCV 79.1 (*)    MCH 23.0 (*)    MCHC 29.1 (*)    All other components within normal limits  BLOOD GAS, VENOUS - Abnormal; Notable for the following components:   pO2, Ven <31 (*)    Bicarbonate 30.2 (*)    Acid-Base Excess 4.2 (*)    All other components within normal limits  URINALYSIS, ROUTINE W REFLEX MICROSCOPIC - Abnormal; Notable for the following components:   Hgb urine dipstick SMALL (*)    All other components within normal limits  RESP PANEL BY RT-PCR (RSV, FLU A&B, COVID)  RVPGX2  AMMONIA  TSH    EKG EKG Interpretation Date/Time:  Wednesday November 22 2022 10:51:44 EDT Ventricular Rate:  73 PR Interval:  182 QRS Duration:  90 QT Interval:  400 QTC Calculation: 441 R Axis:   51  Text Interpretation: Sinus rhythm Confirmed by Beckey Downing [id number])  on 11/22/2022 11:53:49 AM  Radiology CT Head Wo Contrast  Result Date: 11/22/2022 CLINICAL DATA:  Transient ischemic attack. EXAM: CT HEAD WITHOUT CONTRAST TECHNIQUE: Contiguous axial  images were obtained from the base of the skull through the vertex without intravenous contrast. RADIATION DOSE REDUCTION: This exam was performed according to the departmental dose-optimization program which includes automated exposure control, adjustment of the mA and/or kV according to patient size and/or use of iterative reconstruction technique. COMPARISON:  10/26/2022 FINDINGS: Brain: No evidence of acute infarction, hemorrhage, hydrocephalus, extra-axial collection or mass lesion/mass effect. Encephalomalacia within bilateral inferior frontal lobes identified compatible with prior infarct. This appears unchanged from the previous exam. Overlying changes of prior craniotomy noted. Vascular: No hyperdense vessel or unexpected calcification. Skull: Negative for fracture or focal lesion. Status post prior frontal craniotomy. Sinuses/Orbits: No acute finding. Other: None. IMPRESSION: 1. No acute intracranial abnormalities. 2. Encephalomalacia within bilateral inferior frontal lobes compatible with prior infarct. 3. Status post prior frontal craniotomy. Electronically Signed   By: Signa Kell M.D.   On: 11/22/2022 12:33   DG Chest Port 1 View  Result Date: 11/22/2022 CLINICAL DATA:  Shortness of breath. EXAM: PORTABLE CHEST 1 VIEW COMPARISON:  Jul 27, 2022. FINDINGS: Stable cardiomediastinal silhouette. Status post left shoulder arthroplasty. Lungs are clear. IMPRESSION: No active disease. Electronically Signed   By: Lupita Raider M.D.   On: 11/22/2022 11:50    Procedures Procedures    Medications Ordered in ED Medications  ipratropium-albuterol (DUONEB) 0.5-2.5 (3) MG/3ML nebulizer solution 3 mL (3 mLs Nebulization Given 11/22/22 1159)    ED Course/ Medical Decision Making/ A&P                                  Medical Decision Making 72 year old female with past medical history of diabetes, hypertension, and COPD presenting to the emergency department today with altered mental status.  The patient is sonorous here on arrival but will arouse to verbal stimuli.  I will further evaluate her here with basic labs to evaluate for electrolyte abnormality as well as a venous blood gas to evaluate for CO2 retention.  She is oxygenating well at this time.  Also obtain an ammonia level as well as a urinalysis here for further evaluation.  Given her change in mental status will also obtain a CT scan of her head for intracranial hemorrhage or mass lesion.  I will place patient on BiPAP here see if this helps in the event that she is hypercarbic given her history and will also give her a DuoNeb.  I will reevaluate for ultimate disposition.  The patient's workup is largely reassuring.  EKG does not show any acute findings.  The patient was not found to be hypercarbic here.  Her CT scan does not show any acute findings.  After period of observation the patient is awake and alert.  She is back to her baseline per her family.  She did not have any focal neurological deficits here on exam.  I discussed this with the patient and her family.  I am unclear of the etiology of her presentation today but she appears to be back to her baseline.  She is requesting discharge.  She will be discharged per her request and is encouraged to follow-up with her primary care provider.  Amount and/or Complexity of Data Reviewed Labs: ordered. Radiology: ordered.  Risk Prescription drug management.           Final Clinical Impression(s) / ED Diagnoses Final diagnoses:  Somnolence    Rx / DC Orders ED Discharge Orders     None  Durwin Glaze, MD 11/22/22 [phone]

## 2022-11-22 NOTE — Telephone Encounter (Signed)
[id number] 

## 2022-11-22 NOTE — Discharge Instructions (Signed)
Your workup today was reassuring.  Please follow-up with your doctor and return to the ER for worsening symptoms.

## 2022-11-27 ENCOUNTER — Telehealth: Payer: Self-pay | Admitting: Family Medicine

## 2022-11-27 ENCOUNTER — Telehealth: Payer: Self-pay

## 2022-11-27 NOTE — Telephone Encounter (Signed)
Pt called in regard to dermatology referral   Current referral will not be able to see patient until 1/29.  Patient cannot wait until January for appt. Complain of knots in head  Wants to see if she can get an sooner appt with another Dermatology office.  Patient wants a call back in regard,  Wants to try Dr. Margo Aye but is  unsure if they take [facility] does know that they do not accept Medicaid .    Patient also wants to look into physical therapy with [facility].  Patient spoke with [facility] and all they need sis order sent over to them

## 2022-11-27 NOTE — Telephone Encounter (Signed)
Received Vm requesting a call. Returned call to home and cell phone numbers.  No answer.  Left VM on both lines.

## 2022-11-28 ENCOUNTER — Other Ambulatory Visit (HOSPITAL_COMMUNITY): Payer: Self-pay | Admitting: Psychiatry

## 2022-11-28 ENCOUNTER — Other Ambulatory Visit: Payer: Self-pay | Admitting: Physician Assistant

## 2022-11-29 IMAGING — US US RENAL
1 series · 14 of 25 positions shown · non-contrast
Comparison: None.

CLINICAL DATA: Chronic renal disease.

EXAM:
RENAL / URINARY TRACT ULTRASOUND COMPLETE

[Series 1: us renal · 14 of 68 slices shown]
[im 1/68]
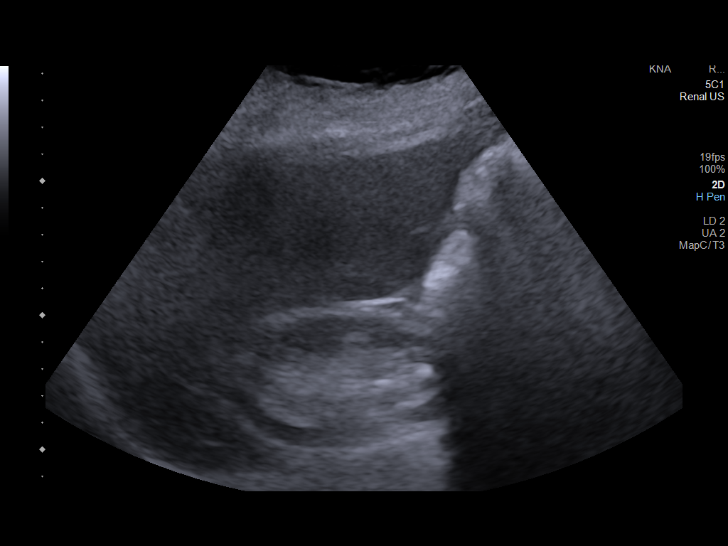
[im 6/68]
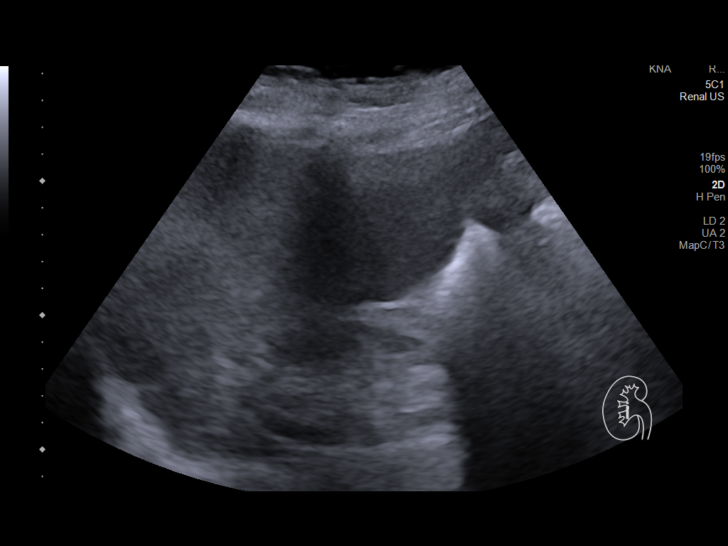
[im 12/68]
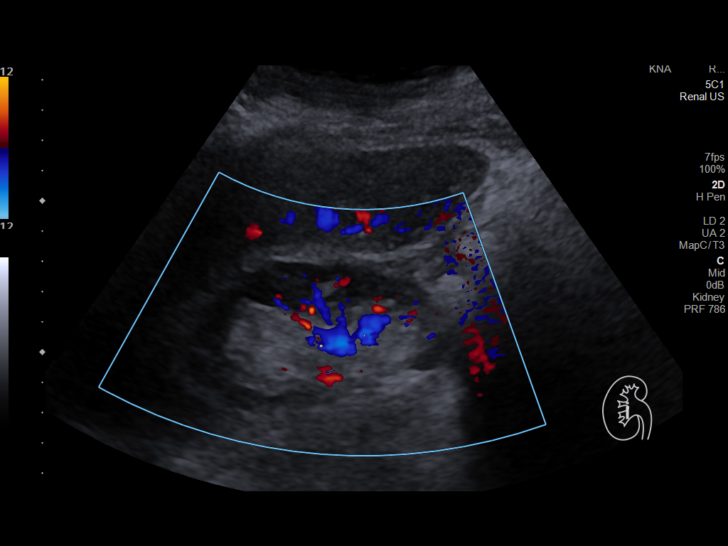
[im 17/68]
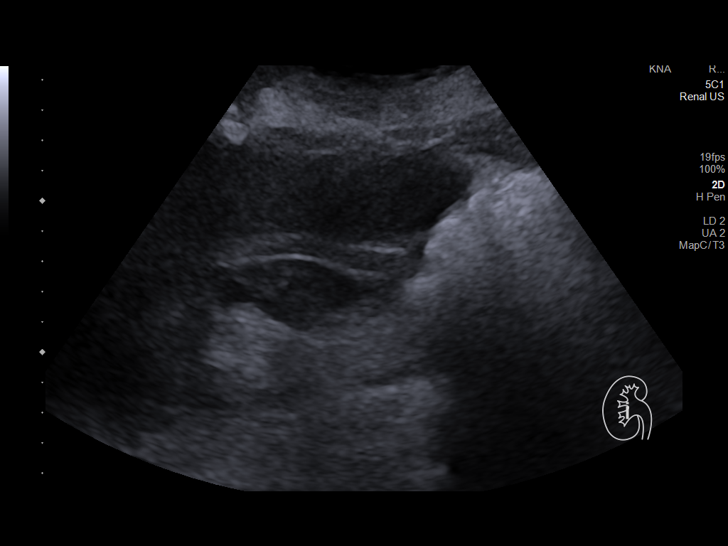
[im 23/68]
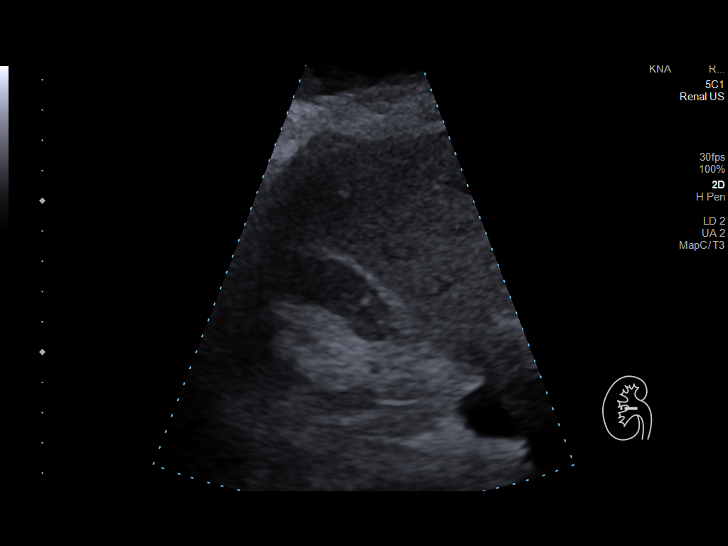
[im 26/68]
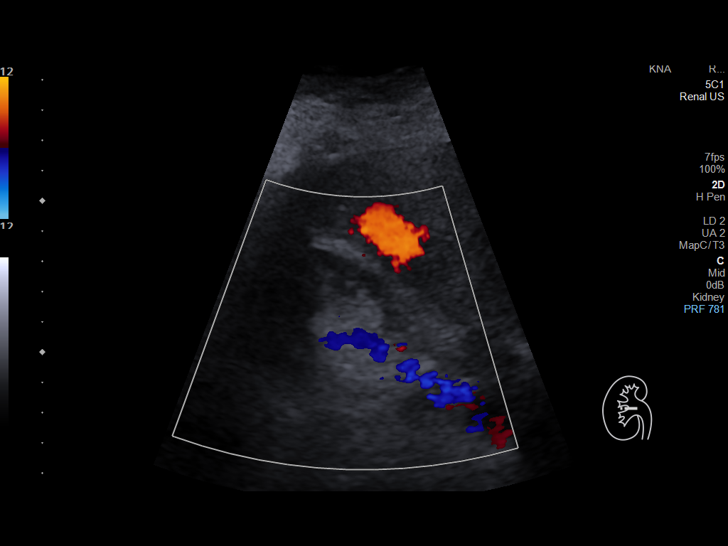
[im 31/68]
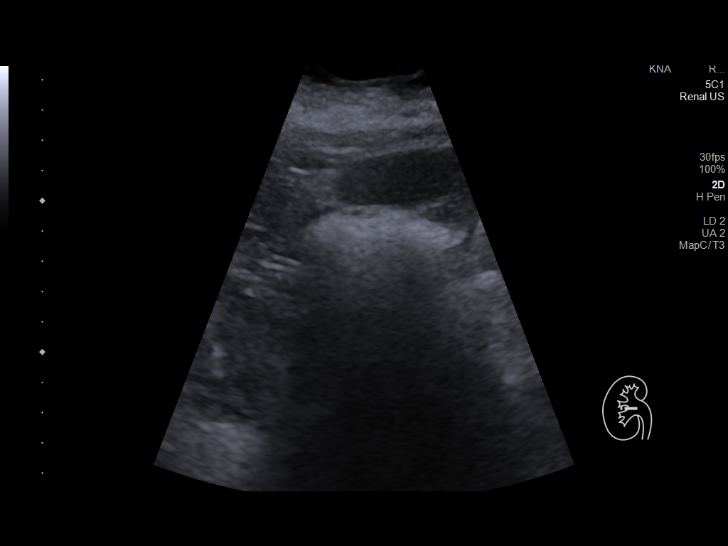
[im 37/68]
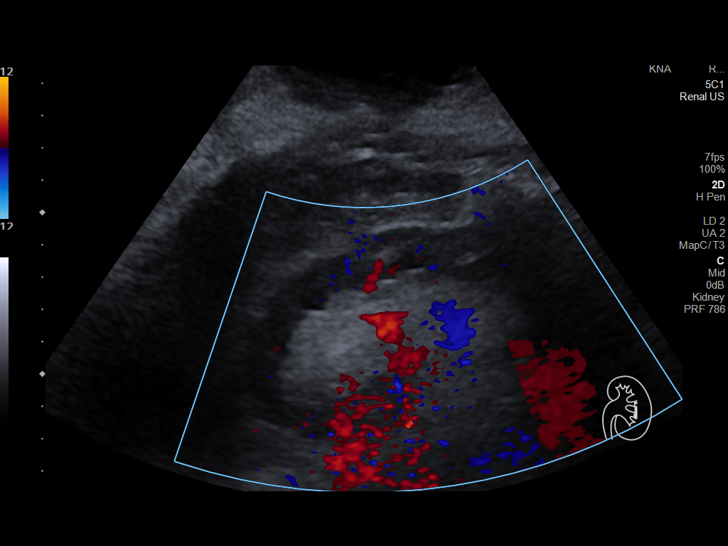
[im 42/68]
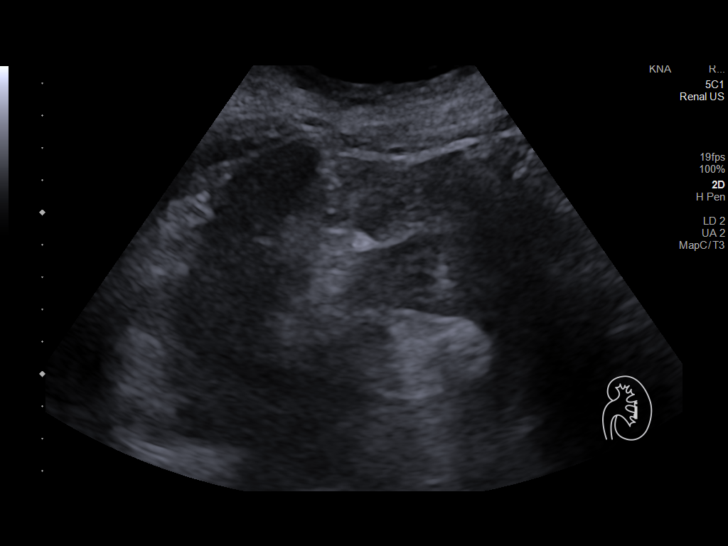
[im 45/68]
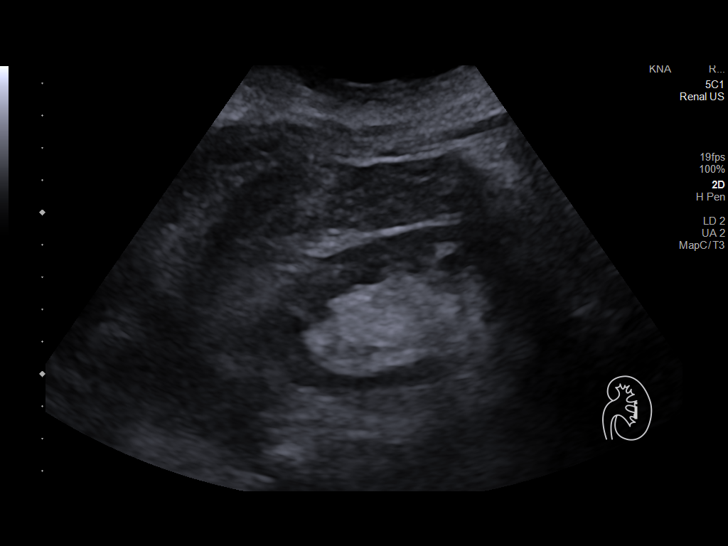
[im 51/68]
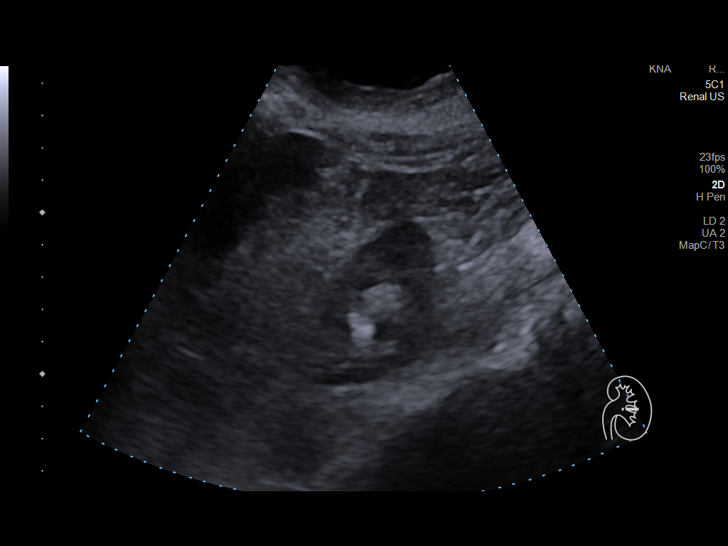
[im 56/68]
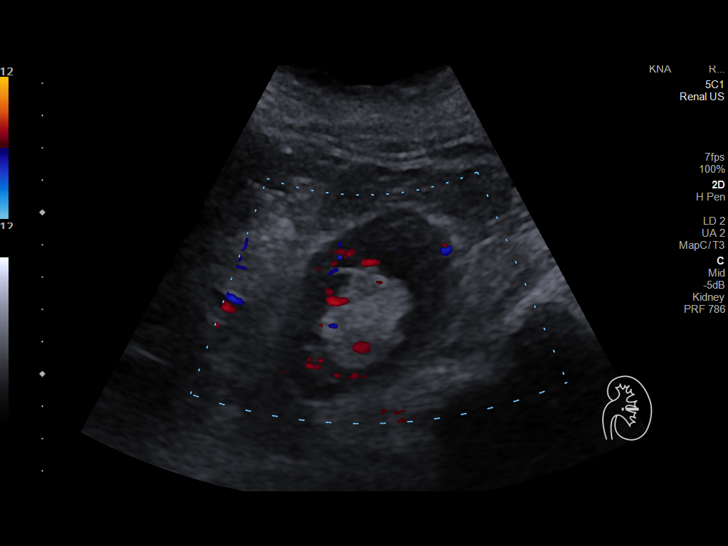
[im 62/68]
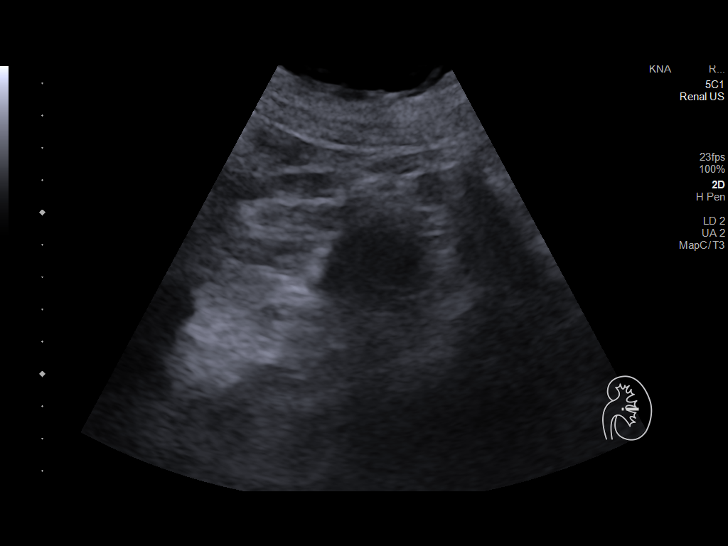
[im 68/68]
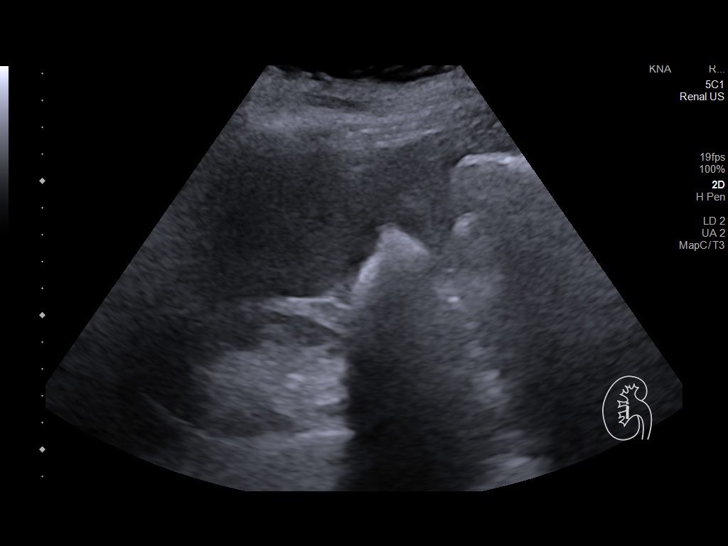

[14 of 25 positions shown; findings below may reference images not displayed]

FINDINGS: Right Kidney:

Renal measurements: 9.1 cm x 4.8 cm x 5.5 cm = volume: 127.1 mL.
There is diffuse renal cortical thinning. Diffusely increased
echogenicity of the renal parenchyma is noted. No mass or
hydronephrosis visualized.

Left Kidney:

Renal measurements: 10.3 cm x 5.9 cm x 5.0 cm = volume: 156.6 mL.
Limited visualization of the lower pole of the left kidney is noted
secondary to overlying bowel gas. There is diffuse renal cortical
thinning. Diffusely increased echogenicity of the renal parenchyma
is noted. No mass or hydronephrosis visualized.

Bladder:

Appears normal for degree of bladder distention. The bilateral
ureteral jets are visualized.

Other:

Limited study secondary to overlying bowel gas and patient body
habitus.
IMPRESSION: Diffuse renal cortical thinning and diffusely increased echogenicity
of both kidneys, likely secondary to medical renal disease.

## 2022-11-29 NOTE — Telephone Encounter (Signed)
Patient called in regard to dermatology referral . Patient has spoken with [facility] office they can see her and do accept [facility] , office need new referral sent over for patient

## 2022-12-01 ENCOUNTER — Other Ambulatory Visit: Payer: Self-pay

## 2022-12-01 ENCOUNTER — Telehealth: Payer: Self-pay | Admitting: Internal Medicine

## 2022-12-01 DIAGNOSIS — E1129 Type 2 diabetes mellitus with other diabetic kidney complication: Secondary | ICD-10-CM | POA: Diagnosis not present

## 2022-12-01 DIAGNOSIS — N1831 Chronic kidney disease, stage 3a: Secondary | ICD-10-CM | POA: Diagnosis not present

## 2022-12-01 DIAGNOSIS — L989 Disorder of the skin and subcutaneous tissue, unspecified: Secondary | ICD-10-CM

## 2022-12-01 DIAGNOSIS — R296 Repeated falls: Secondary | ICD-10-CM

## 2022-12-01 DIAGNOSIS — J449 Chronic obstructive pulmonary disease, unspecified: Secondary | ICD-10-CM

## 2022-12-01 DIAGNOSIS — E1122 Type 2 diabetes mellitus with diabetic chronic kidney disease: Secondary | ICD-10-CM | POA: Diagnosis not present

## 2022-12-01 DIAGNOSIS — R809 Proteinuria, unspecified: Secondary | ICD-10-CM | POA: Diagnosis not present

## 2022-12-01 NOTE — Telephone Encounter (Signed)
Both referrals placed

## 2022-12-01 NOTE — Telephone Encounter (Signed)
Patient needs refill of albuterol and nebulizer solution.   [facility] [employer]

## 2022-12-05 NOTE — Progress Notes (Signed)
Care Guide Note  12/05/2022 Name: [name] MRN: [id number] DOB: 27-Nov-1950  Referred by: Kerri Perches, MD Reason for referral : Care Coordination (Outreach to schedule with Pharm d )   [name] is a 72 y.o. year old female who is a primary care patient of Lodema Hong Milus Mallick, MD. [name] was referred to the pharmacist for assistance related to DM.    Successful contact was made with the patient to discuss pharmacy services including being ready for the pharmacist to call at least 5 minutes before the scheduled appointment time, to have medication bottles and any blood sugar or blood pressure readings ready for review. The patient agreed to meet with the pharmacist via with the pharmacist via telephone visit on (date/time).  12/20/2022  Penne Lash, RMA Care Guide [facility]  [place], [place] [address] Direct Dial: [phone] [email or web address]

## 2022-12-06 ENCOUNTER — Ambulatory Visit: Payer: Medicare HMO | Admitting: Family Medicine

## 2022-12-06 ENCOUNTER — Ambulatory Visit (INDEPENDENT_AMBULATORY_CARE_PROVIDER_SITE_OTHER): Payer: Medicare HMO | Admitting: Psychiatry

## 2022-12-06 DIAGNOSIS — F331 Major depressive disorder, recurrent, moderate: Secondary | ICD-10-CM

## 2022-12-06 NOTE — Progress Notes (Signed)
Virtual Visit via Telephone Note  I connected with [name] on 12/06/22 at 1:05 PM EDT by telephone and verified that I am speaking with the correct person using two identifiers.  Location: Patient: Home Provider: [facility] [place] office    I discussed the limitations, risks, security and privacy concerns of performing an evaluation and management service by telephone and the availability of in person appointments. I also discussed with the patient that there may be a patient responsible charge related to this service. The patient expressed understanding and agreed to proceed.   I provided 51 minutes of non-face-to-face time during this encounter.   [name] Salvage, LCSW              THERAPIST PROGRESS NOTE    Session Time: Wednesday  9/252024 1:05 PM - 1:56 PM  Participation Level: Active  Behavioral Response: less anxious, less depressed  Type of Therapy: Individual Therapy  Treatment Goals addressed: Patient [name] score less than 10 on the patient health questionnaireAnnie "[place]" [name] IDENTIFY 3 COGNITIVE PATTERNS AND BELIEFS THAT SUPPORT [name]   Progress on goals: Progressing  Interventions: CBT and Supportive             Summary: [name] is a 72 y.o. female who presents with  long standing history of recurrent periods  of [name] beginning when she was [age] and her favorite uncle died. Patient reports multiple psychiatric hospitlaizations due to [name] and suicidal ideations with the last one occuring in 1997. Patient has participated in outpatient psychotherapy and medication management intermittently since age 72.  She currently is seeing psychiatrist Dr. Tenny [name] . Prior to this, she was seen at [facility]. Patient also has had ECT at [facility]. Symptoms have worsened in recent months due to family stress and have  included depressed mood, anxiety, excessive worry, and tearfulness.            Patient's last contact was by virtual visit via  telephone about 5-6 weeks ago.  Patient reports improved mood and continued involvement in activity since last session. She still has concerns about her health but is coping better. She continues to work with medical providers as well as her aides. She also reports improvement in her interpersonal skills. She reports being more assertive instead of being aggressive in expressing her opinions and needs. She cites examples. She also reports having more realistic expectations regarding interaction with family members particularly her son and her niece. She expresses disappointment they are not as supportive as she wishes but reports experiencing decreased anger. She also has used her spirituality to help cope. She reads the bible and uses scripture to try to guide her actions particularly in trying to let go of anger. Pt also reports taking more responsibility for her actions and reports recently apologizing to people for aggressive behavior in previous incidents. She reports continued strong support from a close friend and has resumed regular contact with 2 other friends. Pt reports having a few episodes of depressed mood but managing well using her spirituality and distracting activities. Pt is pleased with her progress in treatment.  Suicidal/Homicidal: Nowithout intent/plan      Therapist Response: Reviewed symptoms, discussed stressors, facilitated expression of thoughts and feelings, validated feelings, praised and reinforced pt's efforts to improve assertiveness skills, assisted pt identify effects on her mood/thoughts/behavior and her relationships, discussed setting/maintaining her limits as well as respecting others' limits, praised and reinforced pt's use of healthy coping strategies, discussed pt's progress in treatment,  developed stepdown plan to termination to include 3 more sessions focusing on relapse prevention strategies   Diagnosis: Axis I: MDD, Recurrent, moderate    Axis II:  Deferred Collaboration of Care: Psychiatrist AEB patient working with psychiatrist Dr. Tenny [name]  Patient/Guardian was advised Release of Information must be obtained prior to any record release in order to collaborate their care with an outside provider. Patient/Guardian was advised if they have not already done so to contact the registration department to sign all necessary forms in order for [name] to release information regarding their care.   Consent: Patient/Guardian gives verbal consent for treatment and assignment of benefits for services provided during this visit. Patient/Guardian expressed understanding and agreed to proceed.    [name] Florer E Ahonesty Woodfin, LCSW

## 2022-12-06 NOTE — Telephone Encounter (Signed)
Calling again and running out of these two. Her # is [phone]  She is SOB. I can hear her struggling to breath over the phone

## 2022-12-07 ENCOUNTER — Telehealth: Payer: Self-pay | Admitting: Family Medicine

## 2022-12-07 NOTE — Telephone Encounter (Signed)
Patient called said she called [facility] dermatologist has an appointment 10.09.2024 they told her they have not yet received no referral information and told her no appointment that day.. Patient asking for a call back [phone]

## 2022-12-07 NOTE — Telephone Encounter (Signed)
Patient is calling back again regarding refills on the 2 medications---she has called the pharmacy and was told they do not have those refill authorizations yet

## 2022-12-08 ENCOUNTER — Other Ambulatory Visit: Payer: Self-pay

## 2022-12-08 DIAGNOSIS — Z720 Tobacco use: Secondary | ICD-10-CM

## 2022-12-08 DIAGNOSIS — J449 Chronic obstructive pulmonary disease, unspecified: Secondary | ICD-10-CM

## 2022-12-08 MED ORDER — ALBUTEROL SULFATE HFA 108 (90 BASE) MCG/ACT IN AERS: INHALATION_SPRAY | RESPIRATORY_TRACT | 3 refills | Status: DC

## 2022-12-08 MED ORDER — IPRATROPIUM-ALBUTEROL 0.5-2.5 (3) MG/3ML IN SOLN: 3.0000 mL | Freq: Four times a day (QID) | RESPIRATORY_TRACT | 12 refills | Status: DC | PRN

## 2022-12-08 NOTE — Telephone Encounter (Signed)
Patient requesting nebulizer solution to be sent into [facility] . Patient does have a nebulizer machine.   Dr.Young can you please advise.  Thank you

## 2022-12-08 NOTE — Telephone Encounter (Signed)
[employer] calling now on behalf of PT. This req was put in on 9/20 and PT is struggling to breath. Please submit. She needs this badly.   Patient needs refill of albuterol and nebulizer solution.    Pharmc[facility]

## 2022-12-10 IMAGING — DX DG CHEST 2V
2 series · 2 of 2 positions shown · non-contrast
Comparison: November 24, 2020

CLINICAL DATA: Tobacco user.

EXAM:
CHEST - 2 VIEW

[chest pa]
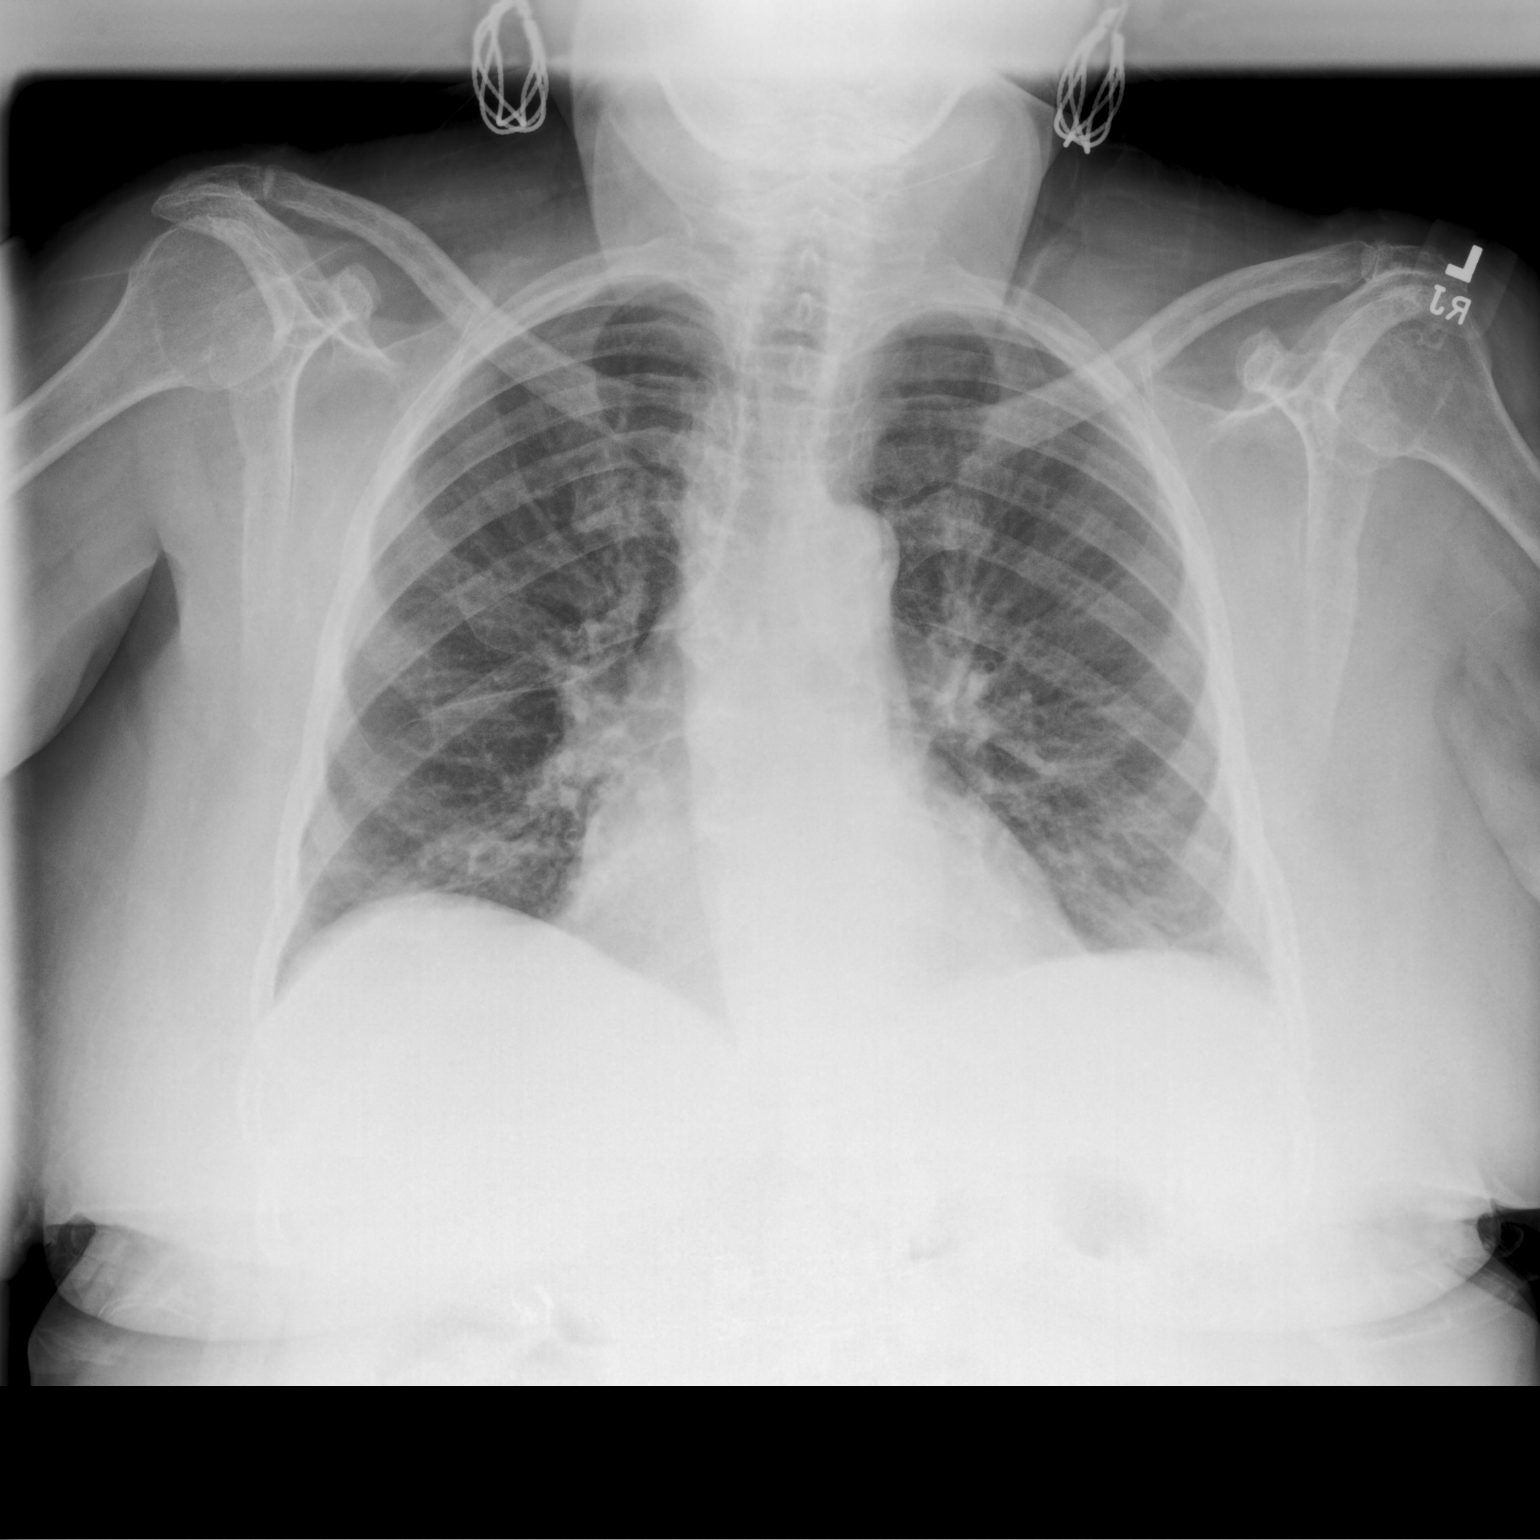

[chest lat]
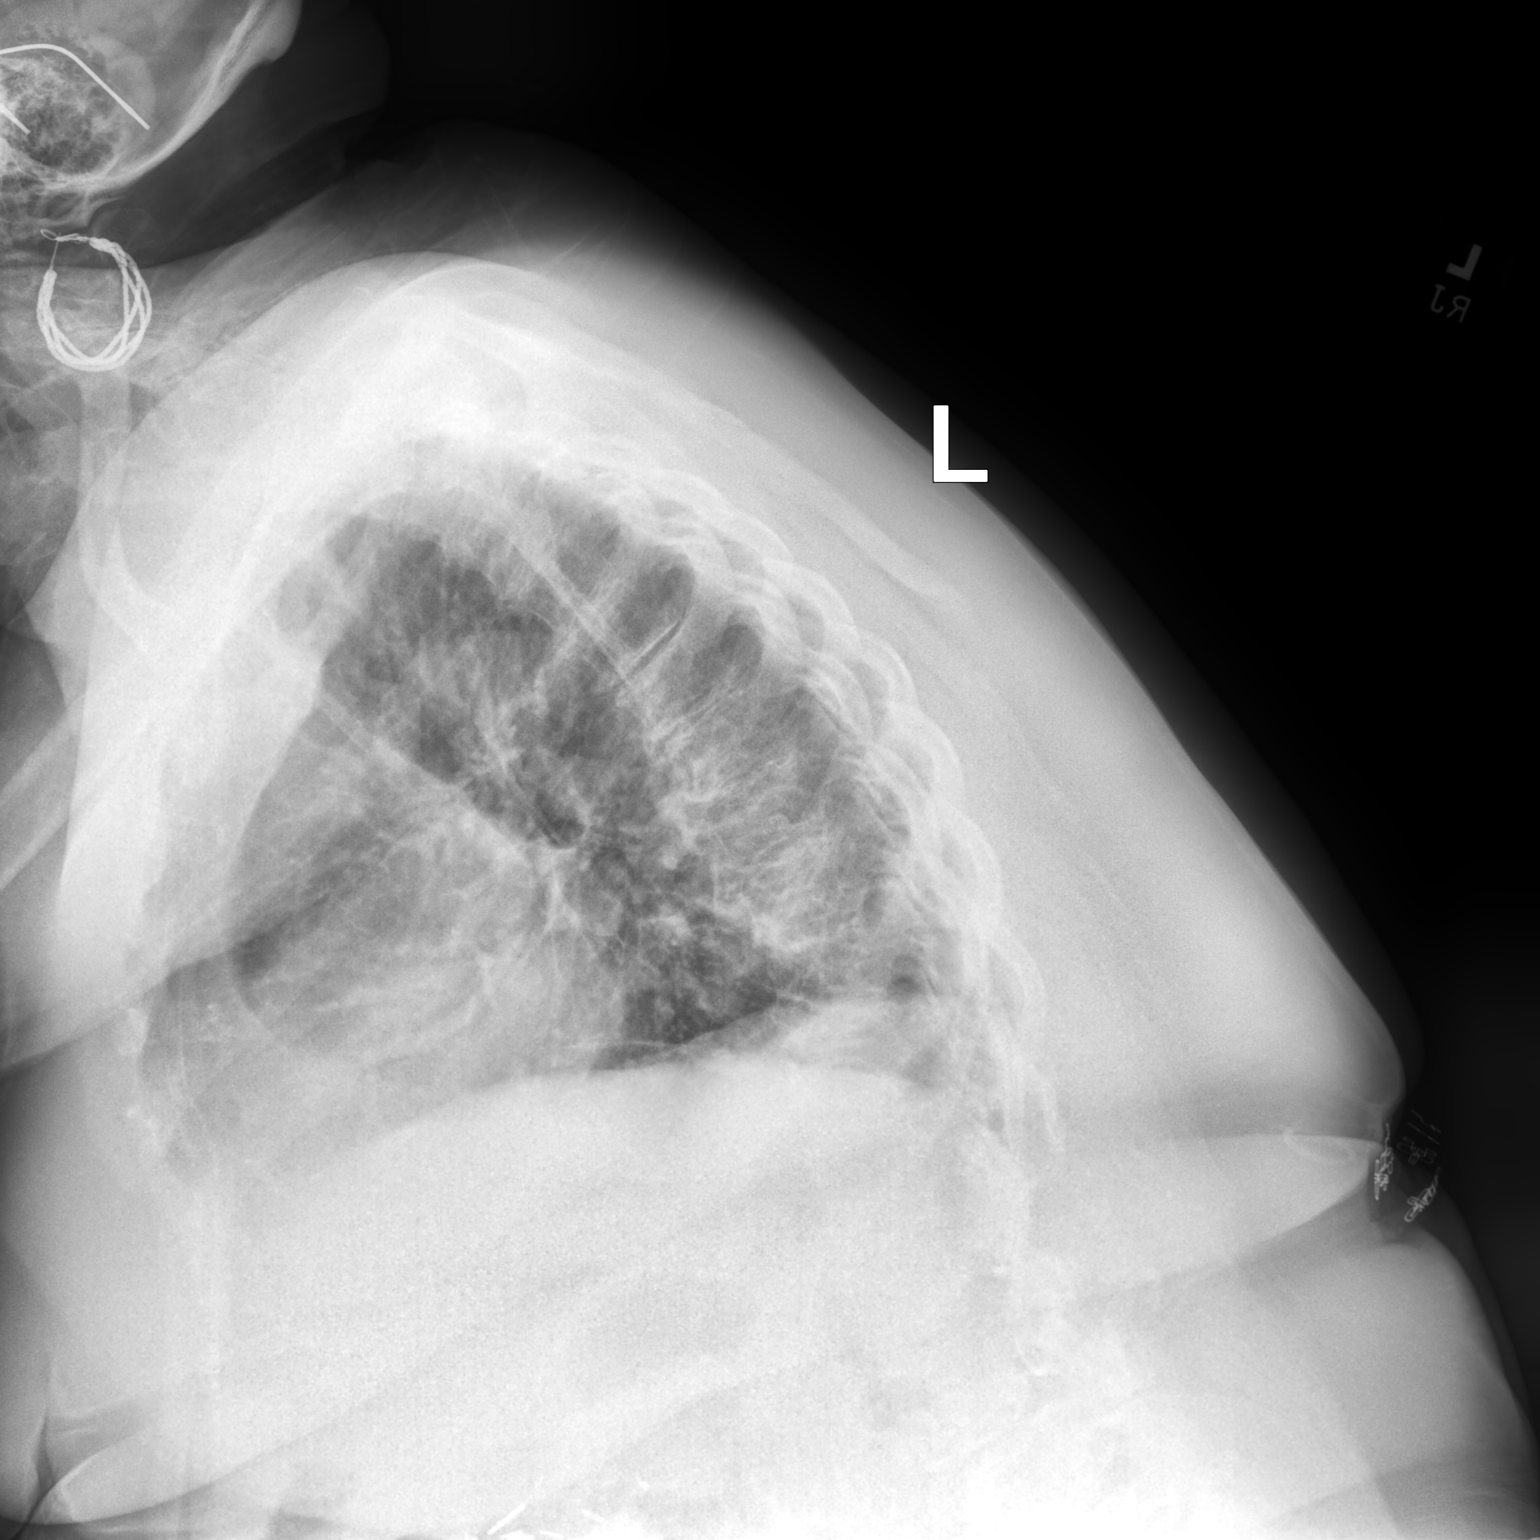

[2 of 2 positions shown; findings below may reference images not displayed]

FINDINGS: The study is limited due to low volumes. The heart size is
borderline to mildly enlarged. The hila and mediastinum are
unchanged. No pneumothorax. No nodules or masses. Mild atelectasis
in the bases. No suspicious infiltrates. No other acute
abnormalities.
IMPRESSION: No active cardiopulmonary disease.

## 2022-12-12 ENCOUNTER — Encounter: Payer: Self-pay | Admitting: Family Medicine

## 2022-12-12 ENCOUNTER — Inpatient Hospital Stay: Payer: Medicare HMO

## 2022-12-12 ENCOUNTER — Inpatient Hospital Stay: Payer: Medicare HMO | Attending: Hematology

## 2022-12-12 ENCOUNTER — Other Ambulatory Visit: Payer: Self-pay

## 2022-12-12 ENCOUNTER — Ambulatory Visit: Payer: Medicare HMO | Admitting: Family Medicine

## 2022-12-12 VITALS — BP 159/67 | HR 59 | Temp 97.6°F | Resp 19

## 2022-12-12 VITALS — BP 150/74 | HR 68 | Ht 59.0 in | Wt 165.0 lb

## 2022-12-12 DIAGNOSIS — E782 Mixed hyperlipidemia: Secondary | ICD-10-CM

## 2022-12-12 DIAGNOSIS — E162 Hypoglycemia, unspecified: Secondary | ICD-10-CM | POA: Diagnosis not present

## 2022-12-12 DIAGNOSIS — N3946 Mixed incontinence: Secondary | ICD-10-CM | POA: Diagnosis not present

## 2022-12-12 DIAGNOSIS — D631 Anemia in chronic kidney disease: Secondary | ICD-10-CM | POA: Insufficient documentation

## 2022-12-12 DIAGNOSIS — R0989 Other specified symptoms and signs involving the circulatory and respiratory systems: Secondary | ICD-10-CM

## 2022-12-12 DIAGNOSIS — I10 Essential (primary) hypertension: Secondary | ICD-10-CM | POA: Diagnosis not present

## 2022-12-12 DIAGNOSIS — Z23 Encounter for immunization: Secondary | ICD-10-CM

## 2022-12-12 DIAGNOSIS — E1122 Type 2 diabetes mellitus with diabetic chronic kidney disease: Secondary | ICD-10-CM | POA: Diagnosis not present

## 2022-12-12 DIAGNOSIS — J449 Chronic obstructive pulmonary disease, unspecified: Secondary | ICD-10-CM | POA: Diagnosis not present

## 2022-12-12 DIAGNOSIS — I509 Heart failure, unspecified: Secondary | ICD-10-CM

## 2022-12-12 DIAGNOSIS — N1831 Chronic kidney disease, stage 3a: Secondary | ICD-10-CM

## 2022-12-12 DIAGNOSIS — I129 Hypertensive chronic kidney disease with stage 1 through stage 4 chronic kidney disease, or unspecified chronic kidney disease: Secondary | ICD-10-CM | POA: Diagnosis not present

## 2022-12-12 DIAGNOSIS — R110 Nausea: Secondary | ICD-10-CM

## 2022-12-12 DIAGNOSIS — Z794 Long term (current) use of insulin: Secondary | ICD-10-CM

## 2022-12-12 DIAGNOSIS — Z79899 Other long term (current) drug therapy: Secondary | ICD-10-CM | POA: Insufficient documentation

## 2022-12-12 DIAGNOSIS — R32 Unspecified urinary incontinence: Secondary | ICD-10-CM | POA: Diagnosis not present

## 2022-12-12 DIAGNOSIS — E1143 Type 2 diabetes mellitus with diabetic autonomic (poly)neuropathy: Secondary | ICD-10-CM

## 2022-12-12 DIAGNOSIS — N1832 Chronic kidney disease, stage 3b: Secondary | ICD-10-CM | POA: Diagnosis not present

## 2022-12-12 DIAGNOSIS — F17218 Nicotine dependence, cigarettes, with other nicotine-induced disorders: Secondary | ICD-10-CM

## 2022-12-12 DIAGNOSIS — D638 Anemia in other chronic diseases classified elsewhere: Secondary | ICD-10-CM

## 2022-12-12 LAB — CBC
HCT: 35.4 % — ABNORMAL LOW (ref 36.0–46.0)
Hemoglobin: 10.1 g/dL — ABNORMAL LOW (ref 12.0–15.0)
MCH: 22.3 pg — ABNORMAL LOW (ref 26.0–34.0)
MCHC: 28.5 g/dL — ABNORMAL LOW (ref 30.0–36.0)
MCV: 78.3 fL — ABNORMAL LOW (ref 80.0–100.0)
Platelets: 256 10*3/uL (ref 150–400)
RBC: 4.52 MIL/uL (ref 3.87–5.11)
RDW: 14.5 % (ref 11.5–15.5)
WBC: 6 10*3/uL (ref 4.0–10.5)
nRBC: 0 % (ref 0.0–0.2)

## 2022-12-12 LAB — GLUCOSE, POCT (MANUAL RESULT ENTRY): POC Glucose: 60 mg/dL — AB (ref 70–99)

## 2022-12-12 MED ORDER — ONDANSETRON HCL 4 MG PO TABS
ORAL_TABLET | ORAL | 2 refills | Status: DC
Start: 2022-12-12 — End: 2023-03-21

## 2022-12-12 MED ORDER — EPOETIN ALFA 10000 UNIT/ML IJ SOLN
10000.0000 [IU] | Freq: Once | INTRAMUSCULAR | Status: AC
  Administered 2022-12-12: 10000 [IU] via SUBCUTANEOUS
  Filled 2022-12-12: qty 1

## 2022-12-12 MED ORDER — ACCU-CHEK GUIDE VI STRP: ORAL_STRIP | 3 refills | Status: DC

## 2022-12-12 NOTE — Telephone Encounter (Signed)
Mediation was sent 9/27 nfn

## 2022-12-12 NOTE — Patient Instructions (Addendum)
Annual exam with mD early January, call if you need me sooner  Once daily pill for nausea is prescribed  Fasting lipid, cmp and EGFR mid October  You are improving   Flu vaccine today    NEED to eat regularly as blood sugar will not fall too low  Careful, no falls  Keep appointments as scheduled, dermatology and urology  Thanks for choosing [facility], we consider it a privelige to serve you.   PLEASE work on totally QUITTING smoking , almost there  Thanks for choosing [facility], we consider it a privelige to serve you.

## 2022-12-12 NOTE — Patient Instructions (Signed)
[facility]  Discharge Instructions: Thank you for choosing [facility] to provide your oncology and hematology care.  If you have a lab appointment with the Cancer Center - please note that after April 8th, 2024, all labs will be drawn in the cancer center.  You do not have to check in or register with the main entrance as you have in the past but will complete your check-in in the cancer center.  Wear comfortable clothing and clothing appropriate for easy access to any Portacath or PICC line.   We strive to give you quality time with your provider. You may need to reschedule your appointment if you arrive late (15 or more minutes).  Arriving late affects you and other patients whose appointments are after yours.  Also, if you miss three or more appointments without notifying the office, you may be dismissed from the clinic at the provider's discretion.      For prescription refill requests, have your pharmacy contact our office and allow 72 hours for refills to be completed.    Today you received the following chemotherapy and/or immunotherapy agents procrit. Epoetin Alfa Injection What is this medication? EPOETIN ALFA (e POE e tin AL fa) treats low levels of red blood cells (anemia) caused by kidney disease, chemotherapy, or HIV medications. It can also be used in people who are at risk for blood loss during surgery. It works by Systems analyst make more red blood cells, which reduces the need for blood transfusions. This medicine may be used for other purposes; ask your health care provider or pharmacist if you have questions. COMMON BRAND NAME(S): Epogen, Procrit, Retacrit What should I tell my care team before I take this medication? They need to know if you have any of these conditions: Blood clots Cancer Heart disease High blood pressure On dialysis Seizures Stroke An unusual or allergic reaction to epoetin alfa, albumin, benzyl alcohol, other  medications, foods, dyes, or preservatives Pregnant or trying to get pregnant Breast-feeding How should I use this medication? This medication is injected into a vein or under the skin. It is usually given by your care team in a hospital or clinic setting. It may also be given at home. If you get this medication at home, you will be taught how to prepare and give it. Use exactly as directed. Take it as directed on the prescription label at the same time every day. Keep taking it unless your care team tells you to stop. It is important that you put your used needles and syringes in a special sharps container. Do not put them in a trash can. If you do not have a sharps container, call your pharmacist or care team to get one. A special MedGuide will be given to you by the pharmacist with each prescription and refill. Be sure to read this information carefully each time. Talk to your care team about the use of this medication in children. While this medication may be used in children as young as 1 month of age for selected conditions, precautions do apply. Overdosage: If you think you have taken too much of this medicine contact a poison control center or emergency room at once. NOTE: This medicine is only for you. Do not share this medicine with others. What if I miss a dose? If you miss a dose, take it as soon as you can. If it is almost time for your next dose, take only that dose. Do not  take double or extra doses. What may interact with this medication? Darbepoetin alfa Methoxy polyethylene glycol-epoetin beta This list may not describe all possible interactions. Give your health care provider a list of all the medicines, herbs, non-prescription drugs, or dietary supplements you use. Also tell them if you smoke, drink alcohol, or use illegal drugs. Some items may interact with your medicine. What should I watch for while using this medication? Visit your care team for regular checks on your  progress. Check your blood pressure as directed. Know what your blood pressure should be and when to contact your care team. Your condition will be monitored carefully while you are receiving this medication. You may need blood work while taking this medication. What side effects may I notice from receiving this medication? Side effects that you should report to your care team as soon as possible: Allergic reactions--skin rash, itching, hives, swelling of the face, lips, tongue, or throat Blood clot--pain, swelling, or warmth in the leg, shortness of breath, chest pain Heart attack--pain or tightness in the chest, shoulders, arms, or jaw, nausea, shortness of breath, cold or clammy skin, feeling faint or lightheaded Increase in blood pressure Rash, fever, and swollen lymph nodes Redness, blistering, peeling, or loosening of the skin, including inside the mouth Seizures Stroke--sudden numbness or weakness of the face, arm, or leg, trouble speaking, confusion, trouble walking, loss of balance or coordination, dizziness, severe headache, change in vision Side effects that usually do not require medical attention (report to your care team if they continue or are bothersome): Bone, joint, or muscle pain Cough Headache Nausea Pain, redness, or irritation at injection site This list may not describe all possible side effects. Call your doctor for medical advice about side effects. You may report side effects to FDA at 1-800-FDA-1088. Where should I keep my medication? Keep out of the reach of children and pets. Store in a refrigerator. Do not freeze. Do not shake. Protect from light. Keep this medication in the original container until you are ready to take it. See product for storage information. Get rid of any unused medication after the expiration date. To get rid of medications that are no longer needed or have expired: Take the medication to a medication take-back program. Check with your  pharmacy or law enforcement to find a location. If you cannot return the medication, ask your pharmacist or care team how to get rid of the medication safely. NOTE: This sheet is a summary. It may not cover all possible information. If you have questions about this medicine, talk to your doctor, pharmacist, or health care provider.  2024 Elsevier/Gold Standard (2021-07-01 00:00:00)       To help prevent nausea and vomiting after your treatment, we encourage you to take your nausea medication as directed.  BELOW ARE SYMPTOMS THAT SHOULD BE REPORTED IMMEDIATELY: *FEVER GREATER THAN 100.4 F (38 C) OR HIGHER *CHILLS OR SWEATING *NAUSEA AND VOMITING THAT IS NOT CONTROLLED WITH YOUR NAUSEA MEDICATION *UNUSUAL SHORTNESS OF BREATH *UNUSUAL BRUISING OR BLEEDING *URINARY PROBLEMS (pain or burning when urinating, or frequent urination) *BOWEL PROBLEMS (unusual diarrhea, constipation, pain near the anus) TENDERNESS IN MOUTH AND THROAT WITH OR WITHOUT PRESENCE OF ULCERS (sore throat, sores in mouth, or a toothache) UNUSUAL RASH, SWELLING OR PAIN  UNUSUAL VAGINAL DISCHARGE OR ITCHING   Items with * indicate a potential emergency and should be followed up as soon as possible or go to the Emergency Department if any problems should occur.  Please show the  CHEMOTHERAPY ALERT CARD or IMMUNOTHERAPY ALERT CARD at check-in to the Emergency Department and triage nurse.  Should you have questions after your visit or need to cancel or reschedule your appointment, please contact [facility] [phone]  and follow the prompts.  Office hours are 8:00 a.m. to 4:30 p.m. Monday - Friday. Please note that voicemails left after 4:00 p.m. may not be returned until the following business day.  We are closed weekends and major holidays. You have access to a nurse at all times for urgent questions. Please call the main number to the clinic [phone] and follow the prompts.  For any non-urgent  questions, you may also contact your provider using MyChart. We now offer e-Visits for anyone 36 and older to request care online for non-urgent symptoms. For details visit [email or web address].   Also download the MyChart app! Go to the app store, search "MyChart", open the app, select [place], and log in with your MyChart username and password.

## 2022-12-12 NOTE — Progress Notes (Signed)
[name] presents today for injection per the provider's orders.  Epogen administration without incident; injection site WNL; see MAR for injection details.  Patient tolerated procedure well and without incident. No complaints at this time. Discharged from clinic by wheel chair in stable condition. Alert and oriented x 3. F/U with [facility] as scheduled.

## 2022-12-13 NOTE — Progress Notes (Signed)
Name: [name] DOB: [birth year] MRN: [id number]  History of Present Illness: [name] is a 72 y.o. female who presents today as a new patient at [facility] [place]. All available relevant medical records have been reviewed. She is accompanied by her aid Marylene Land.  She reports chief complaint urinary frequency, urgency, and urge incontinence. Voiding 6-7x/day and 0-1x/night on average. Leaking several times per day on average; wears diapers. Reports nocturnal enuresis.   She reports stress incontinence with cough/laugh/sneeze. She reports the UUI is predominant.   She reports caffeine intake (decaf coffee 12-16 oz in the mornings; otherwise mostly water).  She denies dysuria, gross hematuria, straining to void, or sensations of incomplete emptying. She denies history of recent or recurrent UTI.  She has been taking Myrbetriq 25 mg daily for about 2 years and reports it has been helpful.  She reports wearing CPAP routinely for her obstructive sleep apnea.  She reports trying to limit fluid intake within 3 hours prior to bedtime. She denies caffeine intake within 8 hours prior to bedtime. She reports some swelling in her left foot; otherwise denies routinely experiencing lower extremity edema during the day.  Reports history of prior "bladder tack" procedure many years ago - she cannot recall if that was done for prolapse versus stress urinary incontinence.    Fall Screening: Do you usually have a device to assist in your mobility? Yes   Medications: Current Outpatient Medications  Medication Sig Dispense Refill   Accu-Chek Softclix Lancets lancets TEST BLOOD SUGAR THREE TIMES DAILY AS DIRECTED 300 each 2   acetaminophen (TYLENOL) 325 MG tablet Take 650 mg by mouth every 6 (six) hours as needed for mild pain, moderate pain or headache.     Alcohol Swabs (DROPSAFE ALCOHOL PREP) 70 % PADS USE TO CLEAN FINGER PRIOR TO TESTING FOR BLOOD SUGAR AS DIRECTED 300 each 3    alendronate (FOSAMAX) 70 MG tablet TAKE 1 TABLET EVERY 7 DAYS ON AN EMPTY STOMACH WITH A FULL GLASS OF WATER (Patient taking differently: Take 70 mg by mouth once a week.) 12 tablet 3   amLODipine (NORVASC) 10 MG tablet TAKE 1 TABLET EVERY DAY (NEED MD APPOINTMENT) 30 tablet 7   aspirin EC 81 MG tablet Take 1 tablet (81 mg total) by mouth daily with breakfast. 120 tablet 2   Blood Glucose Calibration (ACCU-CHEK GUIDE CONTROL) LIQD USE AS DIRECTED 1 each 0   blood glucose meter kit and supplies Dispense based on patient and insurance preference. Use up to four times daily as directed. (FOR ICD-10 E10.9, E11.9). 1 each 0   buPROPion (WELLBUTRIN XL) 150 MG 24 hr tablet Take 1 tablet (150 mg total) by mouth every morning. 90 tablet 2   carvedilol (COREG) 12.5 MG tablet Take 12.5 mg by mouth 2 (two) times daily with a meal.     Cholecalciferol (D3 PO) Take 1 tablet by mouth daily.     Continuous Blood Gluc Sensor (FREESTYLE LIBRE 14 DAY SENSOR) MISC 1 each by Does not apply route every 14 (fourteen) days. Change every 2 weeks 2 each 11   Docusate Sodium (DSS) 100 MG CAPS Take 100 mg by mouth in the morning and at bedtime.     DROPLET PEN NEEDLES 31G X 8 MM MISC USE FOR INJECTING INSULIN 4 TIMES DAILY. 400 each 0   Elastic Bandages & Supports (ADJUSTABLE ARM SLING) MISC L arm sling 1 each 0   ezetimibe (ZETIA) 10 MG tablet TAKE 1 TABLET EVERY  DAY 90 tablet 10   glucose blood (ACCU-CHEK GUIDE) test strip Use as instructed 400 strip 3   hydrochlorothiazide (MICROZIDE) 12.5 MG capsule Take 12.5 mg by mouth daily.     insulin aspart (NOVOLOG FLEXPEN) 100 UNIT/ML FlexPen Inject 5-8 Units into the skin 3 (three) times daily with meals. 30 mL 3   insulin glargine, 2 Unit Dial, (TOUJEO MAX SOLOSTAR) 300 UNIT/ML Solostar Pen Inject 20-26 Units into the skin at bedtime. (Patient taking differently: Inject 23 Units into the skin at bedtime.) 9 mL 1   ipratropium-albuterol (DUONEB) 0.5-2.5 (3) MG/3ML SOLN Take 3 mLs  by nebulization every 6 (six) hours as needed. 360 mL 12   ketoconazole (NIZORAL) 2 % shampoo Apply 1 Application topically 2 (two) times a week. 120 mL 0   lamoTRIgine (LAMICTAL) 100 MG tablet Take 1 tablet (100 mg total) by mouth 2 (two) times daily. 180 tablet 2   levothyroxine (SYNTHROID) 50 MCG tablet TAKE 1 TABLET DAILY SIX DAYS A WEEK AND 1/2 TABLET ON ONE DAY A WEEK (Patient taking differently: Take 50 mcg by mouth See admin instructions. Take 50 mg by mouth for 6 days a week and 25 mg on one day a week) 85 tablet 2   LORazepam (ATIVAN) 0.5 MG tablet Take 1 tablet (0.5 mg total) by mouth 2 (two) times daily. 60 tablet 2   metoprolol tartrate (LOPRESSOR) 50 MG tablet TAKE 1 TABLET TWICE DAILY (NEED MD APPOINTMENT) (Patient taking differently: Take 50 mg by mouth 2 (two) times daily. TAKE 1 TABLET TWICE DAILY (NEED MD APPOINTMENT)) 180 tablet 1   mirabegron ER (MYRBETRIQ) 50 MG TB24 tablet Take 1 tablet (50 mg total) by mouth daily. 30 tablet 5   Misc. Devices (MATTRESS PAD) MISC FIRM MATTRESS PAD for hospital bed x 1 1 each 0   montelukast (SINGULAIR) 10 MG tablet Take 10 mg by mouth daily.     Multiple Vitamins-Minerals (MULTIVITAMIN GUMMIES ADULT PO) Take 1 tablet by mouth daily.     nicotine (NICODERM CQ) 21 mg/24hr patch Place 1 patch (21 mg total) onto the skin daily. 28 patch 0   ondansetron (ZOFRAN) 4 MG tablet Take one tablet by mouth once daily, as needed , for nausea 30 tablet 2   potassium chloride (KLOR-CON) 10 MEQ tablet Take 1 tablet (10 mEq total) by mouth 2 (two) times daily. 30 tablet 2   pregabalin (LYRICA) 75 MG capsule Take 75 mg by mouth daily.     RABEprazole (ACIPHEX) 20 MG tablet TAKE 1 TABLET TWICE DAILY 180 tablet 0   RESTASIS 0.05 % ophthalmic emulsion Place 2 drops into both eyes daily.     risperiDONE (RISPERDAL) 0.5 MG tablet Take 0.5 mg by mouth at bedtime.     rosuvastatin (CRESTOR) 5 MG tablet TAKE 1 TABLET AT BEDTIME 90 tablet 3   sertraline (ZOLOFT) 100 MG  tablet Take 1 tablet (100 mg total) by mouth every morning. 90 tablet 2   spironolactone (ALDACTONE) 50 MG tablet TAKE 1 TABLET (50 MG TOTAL) BY MOUTH DAILY. DISCONTINUE SPIRONOLACTONE 25MG  (Patient taking differently: Take 50 mg by mouth daily. Discontinue Spironolactone 25 mg) 90 tablet 3   telmisartan (MICARDIS) 20 MG tablet Take 10 mg by mouth daily.     traZODone (DESYREL) 150 MG tablet TAKE 1 TABLET AT BEDTIME 90 tablet 3   UNABLE TO FIND Med Name:  G5 Mattress 1 each 0   vitamin E 180 MG (400 UNITS) capsule Take 400 Units by mouth daily.  No current facility-administered medications for this visit.    Allergies: Allergies  Allergen Reactions   Iron Itching and Nausea And Vomiting   Milk (Cow) Rash and Other (See Comments)    Doesn't agree with the stomach, also    Penicillins Hives   Phenazopyridine Hives   Cephalexin Hives   Flonase [Fluticasone] Other (See Comments)    "It gave me ulcers in my nose"   Milk-Related Compounds Nausea Only and Other (See Comments)    Doesn't agree with the stomach    Past Medical History:  Diagnosis Date   Allergy    Anemia    Anemia in chronic kidney disease (CKD) 08/10/2021   Anemia in chronic kidney disease (CKD) 10/12/2021   Anxiety    takes Ativan daily   Arthritis    Assistance needed for mobility    Bipolar disorder (HCC)    takes Risperdal nightly   Blood transfusion    Brain tumor (HCC)    Cancer (HCC)    In her gum   Carpal tunnel syndrome of right wrist 05/23/2011   Cervical disc disorder with radiculopathy of cervical region 10/31/2012   Chronic back pain    Chronic idiopathic constipation    Chronic neck and back pain    Colon polyps    COPD (chronic obstructive pulmonary disease) with chronic bronchitis (HCC) 09/16/2013   Office Spirometry 10/30/2013-submaximal effort based on appearance of loop and curve. Numbers would fit with severe restriction but her physiologic capability may be better than this. FVC  0.91/44%, and 10.74/45%, FEV1/FVC 0.81, FEF 25-75% 1.43/69%     Diabetes mellitus    Type II   Diverticulosis    TCS 9/08 by Dr. Lina Sar for diarrhea . Bx for micro scopic colitis negative.    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   Metabolic encephalopathy 08/03/2011   Migraines    chronic headaches   Mononeuritis lower limb    Narcolepsy    Osteoporosis    Pancreatitis 2006   due to Depakote with normal EUS    Paralysis (HCC)    Pneumonia    Schatzki's ring    non critical / EGD with ED 8/2011with RMR   Seizures (HCC)    takes Lamictal daily.Last seizure 3 yrs ago   Sleep apnea    on CPAP   Small bowel obstruction (HCC)    Stroke (HCC)    left sided weakness, speech changes   Tubular adenoma of colon    Past Surgical History:  Procedure Laterality Date   ABDOMINAL HYSTERECTOMY  1978   BACK SURGERY  July 2012   BACTERIAL OVERGROWTH TEST N/A 05/05/2013   Procedure: BACTERIAL OVERGROWTH TEST;  Surgeon: Corbin Ade, MD;  Location: [facility];  Service: Endoscopy;  Laterality: N/A;  7:30   BIOPSY THYROID  2009   BRAIN SURGERY  11/2011   resection of meningioma  BREAST REDUCTION SURGERY  1994   CARDIAC CATHETERIZATION  05/10/2005   normal coronaries, normal LV systolic function and EF (Dr. Evlyn Courier)   CARPAL TUNNEL RELEASE Left 07/22/04   Dr. Romeo Apple   CATARACT EXTRACTION Bilateral    CHOLECYSTECTOMY  1984   COLONOSCOPY N/A 09/25/2012   AOZ:HYQMVHQ diverticulosis.  colonic  polyp-removed : tubular adenoma   CRANIOTOMY  11/23/2011   Procedure: CRANIOTOMY TUMOR EXCISION;  Surgeon: Hewitt Shorts, MD;  Location: [facility];  Service: Neurosurgery;  Laterality: N/A;  Craniotomy for tumor resection   ESOPHAGOGASTRODUODENOSCOPY  12/29/2010   Rourk-Retained food in the esophagus and stomach, small hiatal hernia, status post Maloney dilation of the esophagus   ESOPHAGOGASTRODUODENOSCOPY N/A 09/25/2012   ION:GEXBMWUX atonic baggy esophagus status post Maloney dilation 56 F. Hiatal hernia   GIVENS CAPSULE STUDY N/A 01/15/2013   NORMAL.    IR GENERIC HISTORICAL  03/17/2016   IR RADIOLOGIST EVAL & MGMT 03/17/2016 [facility]   LESION REMOVAL N/A 05/31/2015   Procedure: REMOVAL RIGHT AND LEFT LESIONS OF MANDIBLE;  Surgeon: Ocie Doyne, DDS;  Location: [facility] OR;  Service: Oral Surgery;  Laterality: N/A;   MALONEY DILATION  12/29/2010   RMR;   NM MYOCAR PERF WALL MOTION  2006   "relavtiely normal" persantine, mild anterior thinning (breast attenuation artifact), no region of scar/ischemia   OVARIAN CYST REMOVAL     RECTOCELE REPAIR N/A 06/29/2015   Procedure: POSTERIOR REPAIR (RECTOCELE);  Surgeon: Tilda Burrow, MD;  Location: [facility] ORS;  Service: Gynecology;  Laterality: N/A;   REDUCTION MAMMAPLASTY Bilateral    REVERSE SHOULDER ARTHROPLASTY Left 08/18/2022   Procedure: REVERSE SHOULDER ARTHROPLASTY;  Surgeon: Beverely Low, MD;  Location: [facility];  Service: Orthopedics;  Laterality: Left;  choice with interscalene, general   SPINE SURGERY  09/29/2010   Dr. Shon Baton   surgical excision of 3 tumors from right thigh and right buttock  and left upper thigh  2010   TOOTH EXTRACTION Bilateral 12/14/2014   Procedure: REMOVAL OF BILATERAL MANDIBULAR EXOSTOSES;  Surgeon: Ocie Doyne, DDS;  Location: [facility] OR;  Service: Oral Surgery;  Laterality: Bilateral;   TRANSTHORACIC ECHOCARDIOGRAM  2010   EF 60-65%, mild conc LVH, grade 1 diastolic dysfunction; mildly calcified MV annulus with mildly  thickened leaflets, mildly calcified MR annulus   Family History  Problem Relation Age of Onset   Heart attack Mother        HTN   Pneumonia Father    Kidney failure Father    Diabetes Father    Pancreatic cancer Sister    Cancer Sister        breast    Cancer Sister        pancreatic   Diabetes Brother    Hypertension Brother    Diabetes Brother    Alcohol abuse Maternal Uncle    Stroke Maternal Grandmother    Heart attack Maternal Grandfather    Hypertension Son    Sleep apnea Son    Colon cancer Neg Hx    Anesthesia problems Neg Hx    Hypotension Neg Hx    Malignant hyperthermia Neg Hx    Pseudochol deficiency Neg Hx    Breast cancer Neg Hx    Stomach cancer Neg Hx    Social History   Socioeconomic History   Marital status: Legally Separated    Spouse name: Not on file   Number of children: 1   Years of education: 12   Highest education level: High school  graduate  Occupational History   Occupation: Disabled  Tobacco Use   Smoking status: Former    Current packs/day: 0.00    Average packs/day: 0.5 packs/day for 47.0 years (23.5 ttl pk-yrs)    Types: Cigarettes    Start date: 07/06/1974    Quit date: 07/05/2021    Years since quitting: 1.4    Passive exposure: Past   Smokeless tobacco: Never   Tobacco comments:    3-4 a day   Vaping Use   Vaping status: Never Used  Substance and Sexual Activity   Alcohol use: No    Alcohol/week: 0.0 standard drinks of alcohol    Comment:     Drug use: No   Sexual activity: Not Currently  Other Topics Concern   Not on file  Social History Narrative   01/29/18 Lives alone, has 3 aides, Mon- Fri 8 hrs, 2 hrs on Sat-Sun, RN manages her meds   Caffeine use: Drink coffee sometimes    Right handed    Social Determinants of Health   Financial Resource Strain: Low Risk  (07/17/2022)   Overall Financial Resource Strain (CARDIA)    Difficulty of Paying Living Expenses: Not very hard  Food Insecurity: No Food Insecurity  (10/30/2022)   Hunger Vital Sign    Worried About Running Out of Food in the Last Year: Never true    Ran Out of Food in the Last Year: Never true  Transportation Needs: No Transportation Needs (10/30/2022)   PRAPARE - Administrator, Civil Service (Medical): No    Lack of Transportation (Non-Medical): No  Physical Activity: Insufficiently Active (07/17/2022)   Exercise Vital Sign    Days of Exercise per Week: 2 days    Minutes of Exercise per Session: 20 min  Stress: No Stress Concern Present (07/17/2022)   [employer] of Occupational Health - Occupational Stress Questionnaire    Feeling of Stress : Not at all  Social Connections: Moderately Integrated (07/17/2022)   Social Connection and Isolation Panel [NHANES]    Frequency of Communication with Friends and Family: Three times a week    Frequency of Social Gatherings with Friends and Family: Once a week    Attends Religious Services: More than 4 times per year    Active Member of [employer] or Organizations: Yes    Attends Engineer, structural: More than 4 times per year    Marital Status: Divorced  Intimate Partner Violence: Not At Risk (08/20/2022)   Humiliation, Afraid, Rape, and Kick questionnaire    Fear of Current or Ex-Partner: No    Emotionally Abused: No    Physically Abused: No    Sexually Abused: No    SUBJECTIVE  Review of Systems Constitutional: Patient denies any unintentional weight loss or change in strength lntegumentary: Patient denies any rashes or pruritus Cardiovascular: Patient denies chest pain or syncope Respiratory: Patient denies shortness of breath Gastrointestinal: Patient denies nausea, vomiting, constipation, or diarrhea Musculoskeletal: Patient denies muscle cramps or weakness Neurologic: Patient denies convulsions or seizures Allergic/Immunologic: Patient denies recent allergic reaction(s) Hematologic/Lymphatic: Patient denies bleeding tendencies Endocrine: Patient denies  heat/cold intolerance  GU: As per HPI.  OBJECTIVE Vitals:   12/18/22 1043  BP: 118/65  Pulse: 60  Temp: 98.4 F (36.9 C)   There is no height or weight on file to calculate BMI.  Physical Examination Constitutional: No obvious distress; patient is non-toxic appearing  Cardiovascular: No visible lower extremity edema.  Respiratory: The patient does not have audible  wheezing/stridor; respirations do not appear labored  Gastrointestinal: Abdomen non-distended Musculoskeletal: Normal ROM of UEs  Skin: No obvious rashes/open sores  Neurologic: CN 2-12 grossly intact Psychiatric: Answered questions appropriately with normal affect  Hematologic/Lymphatic/Immunologic: No obvious bruises or sites of spontaneous bleeding  UA: 6-10 WBC/hpf, 3-10 RBC/hpf, bacteria (few), yeast PVR: 43 ml  ASSESSMENT Urge incontinence - Plan: Urinalysis, Routine w reflex microscopic, BLADDER SCAN AMB NON-IMAGING, mirabegron ER (MYRBETRIQ) 50 MG TB24 tablet  Urinary urgency - Plan: mirabegron ER (MYRBETRIQ) 50 MG TB24 tablet  Mixed stress and urge urinary incontinence - Plan: Urinalysis, Routine w reflex microscopic, BLADDER SCAN AMB NON-IMAGING, mirabegron ER (MYRBETRIQ) 50 MG TB24 tablet  We discussed the different forms of urinary incontinence, such as stress and urge incontinence, and described how symptoms are consistent with mixed urinary incontinence.  1. For treatment of OAB with urinary frequency, nocturia, urgency, and urge incontinence: We discussed the symptoms of overactive bladder (OAB), which include urinary urgency, frequency, nocturia, with or without urge incontinence.   While we may not know the exact etiology of OAB, several risk factors can be identified.  - We discussed this patient's neurogenic risk factors for OAB-type symptoms including prior stroke with resultant paralysis, seizure disorder, T2DM with polyneuropathy, nicotine use.  - Likely exacerbated by diuretic use  (hydrochlorothiazide), caffeine intake, OSA, and ambulatory dysfunction which impairs her ability to make it to the bathroom in time (functional incontinence).   We discussed the following management options in detail including potential benefits, risks, and side effects: Behavioral therapy: Modify fluid intake Decreasing bladder irritants (such as caffeine, acidic foods, spicy foods, alcohol) Urge suppression strategies Bladder retraining / timed voiding Double voiding Medication(s): - Not a safe candidate for anticholinergic medications due to age and comorbidities due to side effect risks.  - Has had some improvement on beta-3 agonist medication Myrbetriq 25 mg daily). Advised to increase dose to 50 mg daily. For refractory cases: Not a candidate for PTNS due to neuropathy Sacral neuromodulation trial (Medtronic lnterStim or Axonics implant) Bladder Botox injections In extreme cases, SP tube placement  She decided to increase Myrbetriq dose to 50 mg daily and to work on behavioral modifications including minimizing caffeine intake and working on timed voiding.  2. For treatment of stress urinary incontinence: We discussed expectant management versus nonsurgical options versus surgery. Nonsurgical options include weight loss, Kegel exercises, with or without physical therapy, as well as an incontinence pessary. Surgical options may include midurethral sling, Burch urethropexy, autologous fascial sling, or transurethral injection of a bulking agent. She elected to proceed with expectant management for now while considering treatment options. Handout provided.  UA today appears abnormal however patient is not acutely symptomatic for UTI, therefore acute antibiotic treatment is not advised at this time. The rationale for this was discussed with the patient. Suspect vaginal contamination of urine specimen.  Will plan for follow up in 6 weeks or sooner if needed. Pt verbalized understanding and  agreement. All questions were answered.  PLAN Advised the following: 1. Increase Myrbetriq to 50 mg daily. 2. Minimize caffeine intake. 3. Work on timed voiding / bladder retraining. 4. Continue routine use of CPAP for OSA. 5. Continue to work on glycemic management. 6. Return in about 6 weeks (around 01/29/2023) for UA, PVR, & f/u with Evette Georges NP.  Orders Placed This Encounter  Procedures   Urinalysis, Routine w reflex microscopic   BLADDER SCAN AMB NON-IMAGING    It has been explained that the patient is to  follow regularly with their PCP in addition to all other providers involved in their care and to follow instructions provided by these respective offices. Patient advised to contact urology clinic if any urologic-pertaining questions, concerns, new symptoms or problems arise in the interim period.  Patient Instructions              Electronically signed by:  Donnita Falls, MSN, FNP-C, CUNP 12/18/2022 12:39 PM

## 2022-12-14 ENCOUNTER — Encounter: Payer: Self-pay | Admitting: Family Medicine

## 2022-12-14 ENCOUNTER — Other Ambulatory Visit: Payer: Self-pay

## 2022-12-14 ENCOUNTER — Other Ambulatory Visit: Payer: Medicare HMO | Admitting: *Deleted

## 2022-12-14 ENCOUNTER — Ambulatory Visit: Payer: Medicare HMO | Admitting: Internal Medicine

## 2022-12-14 DIAGNOSIS — E1122 Type 2 diabetes mellitus with diabetic chronic kidney disease: Secondary | ICD-10-CM | POA: Insufficient documentation

## 2022-12-14 DIAGNOSIS — M5416 Radiculopathy, lumbar region: Secondary | ICD-10-CM | POA: Insufficient documentation

## 2022-12-14 NOTE — Assessment & Plan Note (Signed)
Ms. Barriere is reminded of the importance of commitment to daily physical activity for 30 minutes or more, as able and the need to limit carbohydrate intake to 30 to 60 grams per meal to help with blood sugar control.   The need to take medication as prescribed, test blood sugar as directed, and to call between visits if there is a concern that blood sugar is uncontrolled is also discussed.   Ms. Wandell is reminded of the importance of daily foot exam, annual eye examination, and good blood sugar, blood pressure and cholesterol control.     Latest Ref Rng & Units 11/22/2022   12:33 PM 11/14/2022    9:38 AM 11/03/2022    1:57 PM 10/26/2022    2:26 AM 08/24/2022    4:20 AM  Diabetic Labs  HbA1c 4.0 - 5.6 %   5.3     Creatinine 0.44 - 1.00 mg/dL 4.09  8.11   9.14  7.82       12/12/2022    3:45 PM 12/12/2022    3:35 PM 12/12/2022    1:48 PM 12/12/2022    1:43 PM 11/22/2022    2:00 PM 11/22/2022    1:30 PM 11/22/2022    1:00 PM  BP/Weight  Systolic BP 159 159 150 143 127 159 154  Diastolic BP 67 67 74 64 82 62 62  Wt. (Lbs)    165     BMI    33.33 kg/m2         Latest Ref Rng & Units 05/26/2020   12:00 AM 01/01/2019    1:40 PM  Foot/eye exam completion dates  Eye Exam No Retinopathy No Retinopathy       Foot Form Completion   Done     This result is from an external source.      Over corrected

## 2022-12-14 NOTE — Assessment & Plan Note (Signed)
Uncontrolled, has upcoming appointment with urology

## 2022-12-14 NOTE — Assessment & Plan Note (Signed)
Recurrent , often asymptomatic med management by Endo, pt givencandy in office for asymptomatic relative hypoglycemia

## 2022-12-14 NOTE — Assessment & Plan Note (Signed)
Asked:confirms currently smokes cigarettes °Assess: Unwilling to set a quit date, but is cutting back °Advise: needs to QUIT to reduce risk of cancer, cardio and cerebrovascular disease °Assist: counseled for 5 minutes and literature provided °Arrange: follow up in 2 to 4 months ° °

## 2022-12-14 NOTE — Progress Notes (Signed)
[name]     MRN: [id number]      DOB: 1950/06/10  Chief Complaint  Patient presents with   Follow-up    Follow up reports staying dizzy, nauseous, spots come and go on her face    HPI [name] is here for follow up and re-evaluation of chronic medical conditions, medication management and review of any available recent lab and radiology data.  Preventive health is updated, specifically  Cancer screening and Immunization.   Has upcoming appointment with dermatology The PT denies any adverse reactions to current medications since the last visit.  See above concerns ROS Denies recent fever or chills.c/o chronic dizziness/ light headedness and daily nausea Denies sinus pressure, nasal congestion, ear pain or sore throat. Denies chest congestion, productive cough or wheezing. Denies chest pains, palpitations , PND , orthopnea and leg swelling Denies abdominal pain,  vomiting,diarrhea or constipation.   Chronic  joint pain,  and limitation in mobility. Chronic headaches,  numbness, or tingling. Chronic depression, anxiety and  insomnia.Not suicidal or homicidal, managed by Psychiatry  PE  BP (!) 150/74 (BP Location: Left Arm, Patient Position: Sitting, Cuff Size: Normal)   Pulse 68   Ht 4\' 11"  (1.499 m)   Wt 165 lb (74.8 kg)   SpO2 95%   BMI 33.33 kg/m   Patient alert and oriented and in no cardiopulmonary distress.  HEENT: No facial asymmetry, EOMI,     Neck decreased ROM Chest: Clear to auscultation bilaterally.Decreased air entry  CVS: S1, S2 no murmurs, no S3.Regular rate.  ABD: Soft non tender.   Ext: No edema  MS: decreased  ROM spine, shoulders, hips and knees.  Skin: Intact, no ulcerations or rash noted.  Psych: Good eye contact, normal affect. Memory intact not anxious or depressed appearing.  CNS: CN 2-12 intact, power,  normal throughout.no focal deficits noted.   Assessment & Plan  Urinary incontinence Uncontrolled, has upcoming appointment with  urology  Labile hypertension Improved, managed by Urology DASH diet and commitment to daily physical activity for a minimum of 30 minutes discussed and encouraged, as a part of hypertension management. The importance of attaining a healthy weight is also discussed.     12/12/2022    3:45 PM 12/12/2022    3:35 PM 12/12/2022    1:48 PM 12/12/2022    1:43 PM 11/22/2022    2:00 PM 11/22/2022    1:30 PM 11/22/2022    1:00 PM  BP/Weight  Systolic BP 159 159 150 143 127 159 154  Diastolic BP 67 67 74 64 82 62 62  Wt. (Lbs)    165     BMI    33.33 kg/m2          Hypoglycemia Recurrent , often asymptomatic med management by Endo, pt givencandy in office for asymptomatic relative hypoglycemia  DMII (diabetes mellitus, type 2) (HCC) [name] is reminded of the importance of commitment to daily physical activity for 30 minutes or more, as able and the need to limit carbohydrate intake to 30 to 60 grams per meal to help with blood sugar control.   The need to take medication as prescribed, test blood sugar as directed, and to call between visits if there is a concern that blood sugar is uncontrolled is also discussed.   [name] is reminded of the importance of daily foot exam, annual eye examination, and good blood sugar, blood pressure and cholesterol control.     Latest Ref Rng & Units  11/22/2022   12:33 PM 11/14/2022    9:38 AM 11/03/2022    1:57 PM 10/26/2022    2:26 AM 08/24/2022    4:20 AM  Diabetic Labs  HbA1c 4.0 - 5.6 %   5.3     Creatinine 0.44 - 1.00 mg/dL 8.29  5.62   1.30  8.65       12/12/2022    3:45 PM 12/12/2022    3:35 PM 12/12/2022    1:48 PM 12/12/2022    1:43 PM 11/22/2022    2:00 PM 11/22/2022    1:30 PM 11/22/2022    1:00 PM  BP/Weight  Systolic BP 159 159 150 143 127 159 154  Diastolic BP 67 67 74 64 82 62 62  Wt. (Lbs)    165     BMI    33.33 kg/m2         Latest Ref Rng & Units 05/26/2020   12:00 AM 01/01/2019    1:40 PM  Foot/eye exam completion dates  Eye  Exam No Retinopathy No Retinopathy       Foot Form Completion   Done     This result is from an external source.      Over corrected   Type 2 diabetes mellitus with diabetic chronic kidney disease (HCC) [name] is reminded of the importance of commitment to daily physical activity for 30 minutes or more, as able and the need to limit carbohydrate intake to 30 to 60 grams per meal to help with blood sugar control.   The need to take medication as prescribed, test blood sugar as directed, and to call between visits if there is a concern that blood sugar is uncontrolled is also discussed.   [name] is reminded of the importance of daily foot exam, annual eye examination, and good blood sugar, blood pressure and cholesterol control.     Latest Ref Rng & Units 11/22/2022   12:33 PM 11/14/2022    9:38 AM 11/03/2022    1:57 PM 10/26/2022    2:26 AM 08/24/2022    4:20 AM  Diabetic Labs  HbA1c 4.0 - 5.6 %   5.3     Creatinine 0.44 - 1.00 mg/dL 7.84  6.96   2.95  2.84       12/12/2022    3:45 PM 12/12/2022    3:35 PM 12/12/2022    1:48 PM 12/12/2022    1:43 PM 11/22/2022    2:00 PM 11/22/2022    1:30 PM 11/22/2022    1:00 PM  BP/Weight  Systolic BP 159 159 150 143 127 159 154  Diastolic BP 67 67 74 64 82 62 62  Wt. (Lbs)    165     BMI    33.33 kg/m2         Latest Ref Rng & Units 05/26/2020   12:00 AM 01/01/2019    1:40 PM  Foot/eye exam completion dates  Eye Exam No Retinopathy No Retinopathy       Foot Form Completion   Done     This result is from an external source.      Over corrected as oftn has asymptomatic hypoglycemia, management per Endo  CHF (congestive heart failure) (HCC) Asymptomatic , stable and currently controled   Nicotine dependence Asked:confirms currently smokes cigarettes Assess: Unwilling to set a quit date, but is cutting back Advise: needs to QUIT to reduce risk of cancer, cardio and cerebrovascular disease Assist: counseled for 5 minutes and  literature provided Arrange: follow up in 2 to 4 months   Nausea Reports daily nausea, zofran once aily prescribed

## 2022-12-14 NOTE — Assessment & Plan Note (Signed)
[name] is reminded of the importance of commitment to daily physical activity for 30 minutes or more, as able and the need to limit carbohydrate intake to 30 to 60 grams per meal to help with blood sugar control.   The need to take medication as prescribed, test blood sugar as directed, and to call between visits if there is a concern that blood sugar is uncontrolled is also discussed.   [name] is reminded of the importance of daily foot exam, annual eye examination, and good blood sugar, blood pressure and cholesterol control.     Latest Ref Rng & Units 11/22/2022   12:33 PM 11/14/2022    9:38 AM 11/03/2022    1:57 PM 10/26/2022    2:26 AM 08/24/2022    4:20 AM  Diabetic Labs  HbA1c 4.0 - 5.6 %   5.3     Creatinine 0.44 - 1.00 mg/dL 1.61  0.96   0.45  4.09       12/12/2022    3:45 PM 12/12/2022    3:35 PM 12/12/2022    1:48 PM 12/12/2022    1:43 PM 11/22/2022    2:00 PM 11/22/2022    1:30 PM 11/22/2022    1:00 PM  BP/Weight  Systolic BP 159 159 150 143 127 159 154  Diastolic BP 67 67 74 64 82 62 62  Wt. (Lbs)    165     BMI    33.33 kg/m2         Latest Ref Rng & Units 05/26/2020   12:00 AM 01/01/2019    1:40 PM  Foot/eye exam completion dates  Eye Exam No Retinopathy No Retinopathy       Foot Form Completion   Done     This result is from an external source.      Over corrected as oftn has asymptomatic hypoglycemia, management per Endo

## 2022-12-14 NOTE — Patient Instructions (Signed)
Visit Information  Thank you for taking time to visit with me today. Please don't hesitate to contact me if I can be of assistance to you before our next scheduled telephone appointment.  Following are the goals we discussed today:   Goals Addressed             This Visit's Progress    RNCM Care Management  (HYPERTENSION)  EXPECTED OUTCOME:  MONITOR,SELF- MANAGE AND REDUCE SYMPTOMS OF HYPERTENSION       Current Barriers:  Knowledge Deficits related to Hypertension Chronic Disease Management support and education needs related to management of Hypertension Patient reports her aide monitors blood pressure 7 days per week, states readings "vary from day to day but usually pretty good", pt states she is able to check her own blood pressure Patient reports she does smoke a "few cigarettes" per day but declines smoking cessation, states she continues to weigh daily for CHF Patient reports she continues following up with counselor and psychiatrist for mental health issues, feels this is helping and states " I can get my feelings out" BP Readings from Last 3 Encounters:  12/12/22 (!) 159/67  12/12/22 (!) 150/74  11/22/22 127/82     Planned Interventions: Evaluation of current treatment plan related to hypertension self management and patient's adherence to plan as established by provider. The patient saw the pcp on 12-12-2022. Review of blood pressures being elevated. She says it usually is high at her provider visit. Discussed white coat syndrome. The patient states it is good at home. Reading provided 111/73;   Reviewed medications with patient and discussed importance of compliance. The patient states compliance with medications. Denies any new medication needs. Has an initial outreach with the pharm D on 12-20-2022 at 1 pm- reminder given. ;  Counseled on the importance of exercise goals with target of 150 minutes per week Discussed plans with patient for ongoing care management follow up and  provided patient with direct contact information for care management team; Discussed complications of poorly controlled blood pressure such as heart disease, stroke, circulatory complications, vision complications, kidney impairment, sexual dysfunction;  Reinforced low sodium diet The patient has several upcoming specialist appointments. The patient states she is really wanting to find out what will help her urinary incontinence. That appointment with be on 12-18-2022. Reflective listening and support given.   Symptom Management: Take medications as prescribed   Attend all scheduled provider appointments Call pharmacy for medication refills 3-7 days in advance of running out of medications Attend church or other social activities Call provider office for new concerns or questions  check blood pressure 3 times per week choose a place to take my blood pressure (home, clinic or office, retail store) write blood pressure results in a log or diary learn about high blood pressure take blood pressure log to all doctor appointments call doctor for signs and symptoms of high blood pressure develop an action plan for high blood pressure keep all doctor appointments take medications for blood pressure exactly as prescribed begin an exercise program report new symptoms to your doctor eat more whole grains, fruits and vegetables, lean meats and healthy fats Continue to follow low salt diet- read labels Continue following up with your counselor and psychiatrist Continue to weigh daily  Follow Up Plan: Telephone follow up appointment with care management team member scheduled for:  02/15/23 at 0945 am       RNCM Care Management (DIABETES) EXPECTED OUTCOME: MONITOR, SELF-MANAGE AND REDUCE SYMPTOMS OF DIABETES  Current Barriers:  Knowledge Deficits related to Diabetes Chronic Disease Management support and education needs related to management of Diabetes, diet Patient reports she lives alone,  has CAP aide 8 hours per day/ 7 days per week and she provides transportation as well as church members assist, patient reports she checks CBG QID with fasting ranges low 100's recently with reading 140 today, random readings in 100's, (states no low readings recently)  (has [employer] and states she has decided not to use) feels this is too much trouble, Patient does not give permission for RN care manager to speak with aide.  Patient reports she has had no recent falls since August 2023. Patient states nurse from [facility] no longer prefills medications due to [facility] no longer pays for this service, pt reports she is taking as prescribed.  Pt has needed DME - WC, cane, walker, hospital bed. Patient reports she now has CPAP machine and is using nightly without any issues Lab Results  Component Value Date   HGBA1C 5.3 11/03/2022     Planned Interventions: Reviewed medications with patient and discussed importance of medication adherence. The patient is compliant with her medications. States that she saw the pcp recently and denies any medication changes. The patient has an initial outreach with the pharm D on 12-20-2022 at 1 pm. Information given today to the patient and she was able to add it to her calendar;        Counseled on importance of regular laboratory monitoring as prescribed. Her A1c is stable. She had labs recently and her A1C is WNL        Advised patient, providing education and rationale, to check cbg per doctor's order  and record. The patient states that her blood sugar this am was 160. She was not happy with that reading. She states the highest she has seen recently is 310. Review of the goals of blood sugars: Fasting <130 and post prandial of <180.      call provider for findings outside established parameters;       Review of patient status, including review of consultants reports, relevant laboratory and other test results, and medications completed;       Reviewed  with patient the importance of taking medications as prescribed, do not skip doses, do not double up, do not swap medication around  Reinforced with patient if she wants to lock up the medications as she says she does sometimes, to make sure and unlock at correct time and take medication as prescribed, pt verbalizes understanding Reinforced importance of using CPAP as prescribed. Her aide Angie was there today and took down the phone number and appointments for the [facility] Reinforced carbohydrate modified diet. Review and provided education.  Reviewed with pt that her aide can watch her take medications to make sure she is taking correctly if there seem to be any issues or confusion for pt  Symptom Management: Take medications as prescribed   Attend all scheduled provider appointments Call pharmacy for medication refills 3-7 days in advance of running out of medications Attend church or other social activities Call provider office for new concerns or questions  check blood sugar at prescribed times: 4 times daily check feet daily for cuts, sores or redness enter blood sugar readings and medication or insulin into daily log take the blood sugar log to all doctor visits take the blood sugar meter to all doctor visits trim toenails straight across fill half of plate with vegetables manage  portion size prepare main meal at home 3 to 5 days each week read food labels for fat, fiber, carbohydrates and portion size wash and dry feet carefully every day wear comfortable, cotton socks wear comfortable, well-fitting shoes Continue using CPAP machine as prescribed Take all medications as prescribed You acknowledged your aide is not allowed to prefill medication box but she can watch and make sure you take the correct medication out of each bottle, if you are having any issues/ confusion about medications  Follow Up Plan: Telephone follow up appointment with care management team member scheduled for:   02/15/23 at 0945 am           Our next appointment is by telephone on 02-15-2023 at 0945 am  Please call the care guide team at [phone] if you need to cancel or reschedule your appointment.   If you are experiencing a Mental Health or Behavioral Health Crisis or need someone to talk to, please call the Suicide and Crisis Lifeline: 988 call the [place] National Suicide Prevention Lifeline: 917-798-3514 or TTY: (780) 805-7305 TTY 347-448-0286) to talk to a trained counselor call 1-800-273-TALK (toll free, 24 hour hotline)   Patient verbalizes understanding of instructions and care plan provided today and agrees to view in MyChart. Active MyChart status and patient understanding of how to access instructions and care plan via MyChart confirmed with patient.     Telephone follow up appointment with care management team member scheduled for: 02-15-2023 at 0945 am  Alto Denver RN, MSN, CCM RN Care Manager  [facility]  Ambulatory Care Management  Direct Number: [phone]

## 2022-12-14 NOTE — Assessment & Plan Note (Signed)
Asymptomatic , stable and currently controled

## 2022-12-14 NOTE — Assessment & Plan Note (Signed)
Reports daily nausea, zofran once aily prescribed

## 2022-12-14 NOTE — Assessment & Plan Note (Signed)
Improved, managed by Urology DASH diet and commitment to daily physical activity for a minimum of 30 minutes discussed and encouraged, as a part of hypertension management. The importance of attaining a healthy weight is also discussed.     12/12/2022    3:45 PM 12/12/2022    3:35 PM 12/12/2022    1:48 PM 12/12/2022    1:43 PM 11/22/2022    2:00 PM 11/22/2022    1:30 PM 11/22/2022    1:00 PM  BP/Weight  Systolic BP 159 159 150 143 127 159 154  Diastolic BP 67 67 74 64 82 62 62  Wt. (Lbs)    165     BMI    33.33 kg/m2

## 2022-12-14 NOTE — Patient Outreach (Signed)
Care Management   Visit Note  12/14/2022 Name: [name] MRN: [id number] DOB: 04-Feb-1951  Subjective: [name] is a 72 y.o. year old female who is a primary care patient of Kerri Perches, MD. The Care Management team was consulted for assistance.      Engaged with patient spoke with patient by telephone.    Goals Addressed             This Visit's Progress    RNCM Care Management  (HYPERTENSION)  EXPECTED OUTCOME:  MONITOR,SELF- MANAGE AND REDUCE SYMPTOMS OF HYPERTENSION       Current Barriers:  Knowledge Deficits related to Hypertension Chronic Disease Management support and education needs related to management of Hypertension Patient reports her aide monitors blood pressure 7 days per week, states readings "vary from day to day but usually pretty good", pt states she is able to check her own blood pressure Patient reports she does smoke a "few cigarettes" per day but declines smoking cessation, states she continues to weigh daily for CHF Patient reports she continues following up with counselor and psychiatrist for mental health issues, feels this is helping and states " I can get my feelings out" BP Readings from Last 3 Encounters:  12/12/22 (!) 159/67  12/12/22 (!) 150/74  11/22/22 127/82     Planned Interventions: Evaluation of current treatment plan related to hypertension self management and patient's adherence to plan as established by provider. The patient saw the pcp on 12-12-2022. Review of blood pressures being elevated. She says it usually is high at her provider visit. Discussed white coat syndrome. The patient states it is good at home. Reading provided 111/73;   Reviewed medications with patient and discussed importance of compliance. The patient states compliance with medications. Denies any new medication needs. Has an initial outreach with the pharm D on 12-20-2022 at 1 pm- reminder given. ;  Counseled on the importance of exercise goals with target of  150 minutes per week Discussed plans with patient for ongoing care management follow up and provided patient with direct contact information for care management team; Discussed complications of poorly controlled blood pressure such as heart disease, stroke, circulatory complications, vision complications, kidney impairment, sexual dysfunction;  Reinforced low sodium diet The patient has several upcoming specialist appointments. The patient states she is really wanting to find out what will help her urinary incontinence. That appointment with be on 12-18-2022. Reflective listening and support given.   Symptom Management: Take medications as prescribed   Attend all scheduled provider appointments Call pharmacy for medication refills 3-7 days in advance of running out of medications Attend church or other social activities Call provider office for new concerns or questions  check blood pressure 3 times per week choose a place to take my blood pressure (home, clinic or office, retail store) write blood pressure results in a log or diary learn about high blood pressure take blood pressure log to all doctor appointments call doctor for signs and symptoms of high blood pressure develop an action plan for high blood pressure keep all doctor appointments take medications for blood pressure exactly as prescribed begin an exercise program report new symptoms to your doctor eat more whole grains, fruits and vegetables, lean meats and healthy fats Continue to follow low salt diet- read labels Continue following up with your counselor and psychiatrist Continue to weigh daily  Follow Up Plan: Telephone follow up appointment with care management team member scheduled for:  02/15/23 at 0945 am  RNCM Care Management (DIABETES) EXPECTED OUTCOME: MONITOR, SELF-MANAGE AND REDUCE SYMPTOMS OF DIABETES       Current Barriers:  Knowledge Deficits related to Diabetes Chronic Disease Management support and  education needs related to management of Diabetes, diet Patient reports she lives alone, has CAP aide 8 hours per day/ 7 days per week and she provides transportation as well as church members assist, patient reports she checks CBG QID with fasting ranges low 100's recently with reading 140 today, random readings in 100's, (states no low readings recently)  (has [employer] and states she has decided not to use) feels this is too much trouble, Patient does not give permission for RN care manager to speak with aide.  Patient reports she has had no recent falls since August 2023. Patient states nurse from [facility] no longer prefills medications due to [facility] no longer pays for this service, pt reports she is taking as prescribed.  Pt has needed DME - WC, cane, walker, hospital bed. Patient reports she now has CPAP machine and is using nightly without any issues Lab Results  Component Value Date   HGBA1C 5.3 11/03/2022     Planned Interventions: Reviewed medications with patient and discussed importance of medication adherence. The patient is compliant with her medications. States that she saw the pcp recently and denies any medication changes. The patient has an initial outreach with the pharm D on 12-20-2022 at 1 pm. Information given today to the patient and she was able to add it to her calendar;        Counseled on importance of regular laboratory monitoring as prescribed. Her A1c is stable. She had labs recently and her A1C is WNL        Advised patient, providing education and rationale, to check cbg per doctor's order  and record. The patient states that her blood sugar this am was 160. She was not happy with that reading. She states the highest she has seen recently is 310. Review of the goals of blood sugars: Fasting <130 and post prandial of <180.      call provider for findings outside established parameters;       Review of patient status, including review of consultants reports,  relevant laboratory and other test results, and medications completed;       Reviewed with patient the importance of taking medications as prescribed, do not skip doses, do not double up, do not swap medication around  Reinforced with patient if she wants to lock up the medications as she says she does sometimes, to make sure and unlock at correct time and take medication as prescribed, pt verbalizes understanding Reinforced importance of using CPAP as prescribed. Her aide Angie was there today and took down the phone number and appointments for the [facility] Reinforced carbohydrate modified diet. Review and provided education.  Reviewed with pt that her aide can watch her take medications to make sure she is taking correctly if there seem to be any issues or confusion for pt  Symptom Management: Take medications as prescribed   Attend all scheduled provider appointments Call pharmacy for medication refills 3-7 days in advance of running out of medications Attend church or other social activities Call provider office for new concerns or questions  check blood sugar at prescribed times: 4 times daily check feet daily for cuts, sores or redness enter blood sugar readings and medication or insulin into daily log take the blood sugar log to all doctor visits take  the blood sugar meter to all doctor visits trim toenails straight across fill half of plate with vegetables manage portion size prepare main meal at home 3 to 5 days each week read food labels for fat, fiber, carbohydrates and portion size wash and dry feet carefully every day wear comfortable, cotton socks wear comfortable, well-fitting shoes Continue using CPAP machine as prescribed Take all medications as prescribed You acknowledged your aide is not allowed to prefill medication box but she can watch and make sure you take the correct medication out of each bottle, if you are having any issues/ confusion about medications  Follow Up  Plan: Telephone follow up appointment with care management team member scheduled for:  02/15/23 at 0945 am             Consent to Services:  Patient was given information about care management services, agreed to services, and gave verbal consent to participate.   Plan: Telephone follow up appointment with care management team member scheduled for: 02-15-2023 at 0945 am  Alto Denver RN, MSN, CCM RN Care Manager  [facility]  Ambulatory Care Management  Direct Number: [phone]

## 2022-12-18 ENCOUNTER — Encounter: Payer: Self-pay | Admitting: Urology

## 2022-12-18 ENCOUNTER — Ambulatory Visit (INDEPENDENT_AMBULATORY_CARE_PROVIDER_SITE_OTHER): Payer: Medicare HMO | Admitting: Urology

## 2022-12-18 VITALS — BP 118/65 | HR 60 | Temp 98.4°F

## 2022-12-18 DIAGNOSIS — N3941 Urge incontinence: Secondary | ICD-10-CM

## 2022-12-18 DIAGNOSIS — R3915 Urgency of urination: Secondary | ICD-10-CM

## 2022-12-18 DIAGNOSIS — R32 Unspecified urinary incontinence: Secondary | ICD-10-CM

## 2022-12-18 DIAGNOSIS — N3946 Mixed incontinence: Secondary | ICD-10-CM | POA: Diagnosis not present

## 2022-12-18 LAB — URINALYSIS, ROUTINE W REFLEX MICROSCOPIC
Bilirubin, UA: NEGATIVE
Glucose, UA: NEGATIVE
Ketones, UA: NEGATIVE
Nitrite, UA: NEGATIVE
Protein,UA: NEGATIVE
Specific Gravity, UA: 1.02 (ref 1.005–1.030)
Urobilinogen, Ur: 0.2 mg/dL (ref 0.2–1.0)
pH, UA: 6 (ref 5.0–7.5)

## 2022-12-18 LAB — MICROSCOPIC EXAMINATION

## 2022-12-18 LAB — BLADDER SCAN AMB NON-IMAGING: Scan Result: 43

## 2022-12-18 IMAGING — MR MR HEAD WO/W CM
12 series · 48 of 48 positions shown · IV contrast (17 ml multihance)
Comparison: Head MRI 11/24/2020 and 04/22/2020

CLINICAL DATA: Brain/CNS neoplasm, monitor. Meningioma status post
surgery and radiation.

EXAM:
MRI HEAD WITHOUT AND WITH CONTRAST
TECHNIQUE: Multiplanar, multiecho pulse sequences of the brain and surrounding
structures were obtained without and with intravenous contrast.
CONTRAST:  17mL MULTIHANCE GADOBENATE DIMEGLUMINE 529 MG/ML IV SOLN

[Series 3: FLAIR · sagittal · 3.0mm · 0.75mm/px · 3 of 39 slices shown (1 of 2)]
[im 1/39]
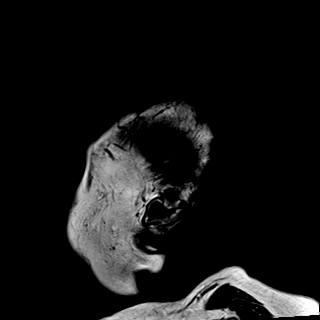
[im 20/39]
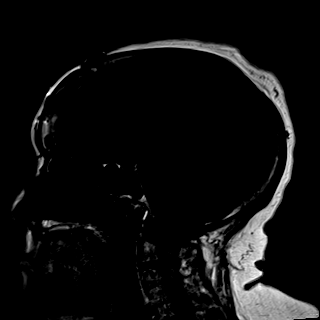
[im 39/39]
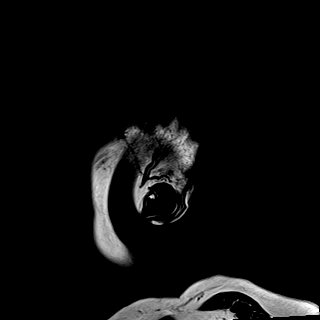

[Series 4: DWI · axial · 3.0mm · 1.50mm/px · z∈[-102,+46]mm · 4 of 78 slices shown (1 of 2)]
[im 1/78]
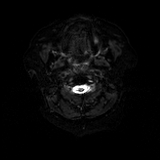
[im 26/78]
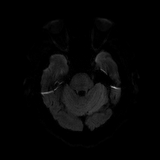
[im 52/78]
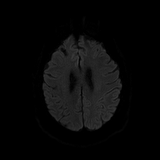
[im 78/78]
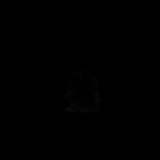

[Series 5: DWI · axial · 3.0mm · 1.50mm/px · z∈[-102,+46]mm · 2 of 39 slices shown (2 of 2)]
[im 1/39]
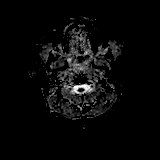
[im 39/39]
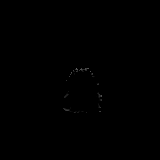

[Series 6: T2 · axial · 5.0mm · 0.57mm/px · 1 of 27 slices shown (1 of 2)]
[im 1/27]
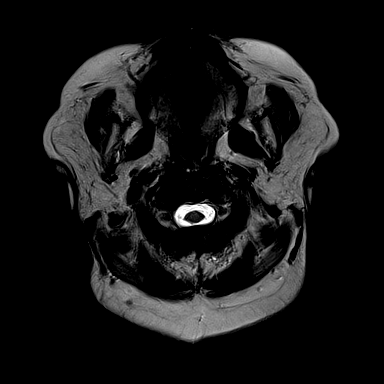

[Series 7: swi_images · axial · 1.5mm · 0.90mm/px · z∈[-99,+43]mm · 5 of 96 slices shown]
[im 1/96]
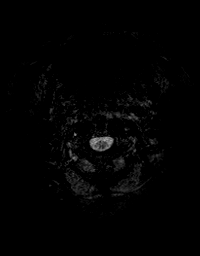
[im 24/96]
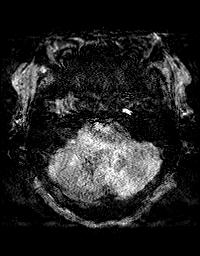
[im 48/96]
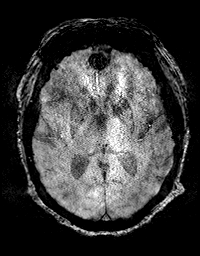
[im 72/96]
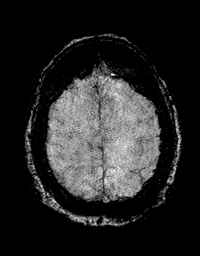
[im 96/96]
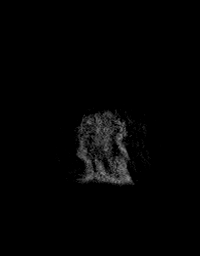

[Series 9: FLAIR · axial · 3.0mm · 0.86mm/px · z∈[-134,+58]mm · 3 of 62 slices shown (2 of 2)]
[im 1/62]
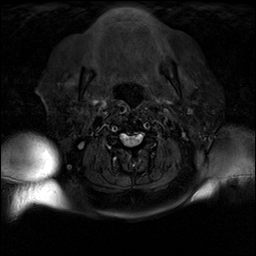
[im 31/62]
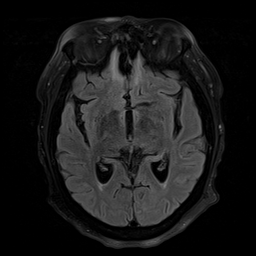
[im 62/62]
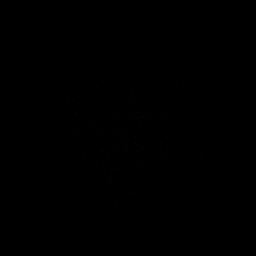

[Series 10: T2 · axial · non-contrast · 1.0mm · 0.86mm/px · z∈[-112,+44]mm · 8 of 160 slices shown (2 of 2)]
[im 1/160]
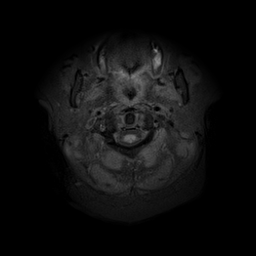
[im 23/160]
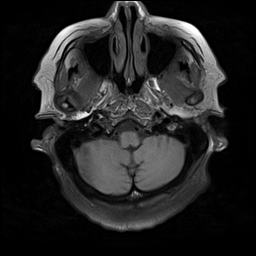
[im 46/160]
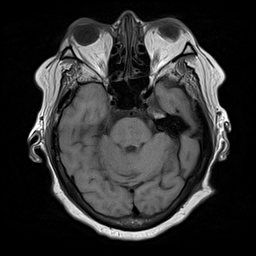
[im 69/160]
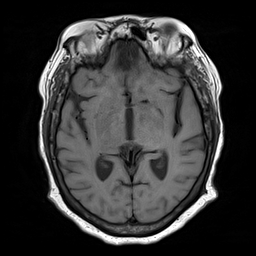
[im 91/160]
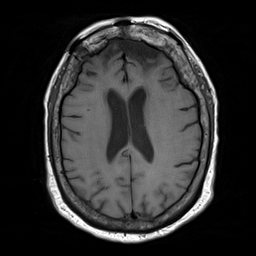
[im 114/160]
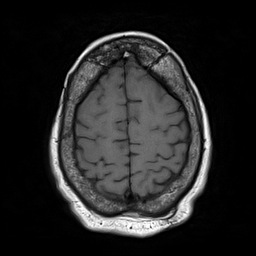
[im 137/160]
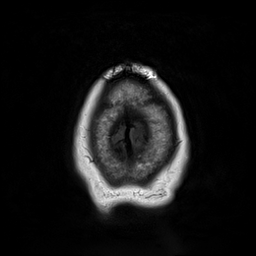
[im 160/160]
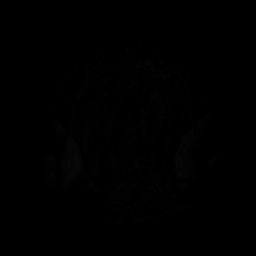

[Series 11: T2 post-contrast · coronal · 3.0mm · 0.57mm/px · 2 of 47 slices shown (1 of 2)]
[im 1/47]
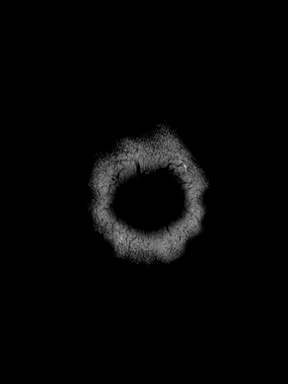
[im 47/47]
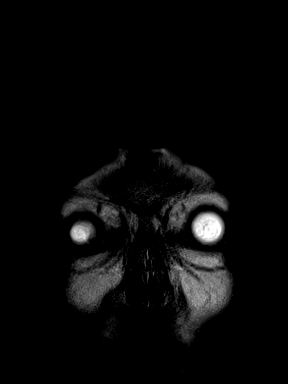

[Series 12: T2 post-contrast · axial · 1.0mm · 0.86mm/px · z∈[-112,+44]mm · 8 of 160 slices shown (2 of 2)]
[im 1/160]
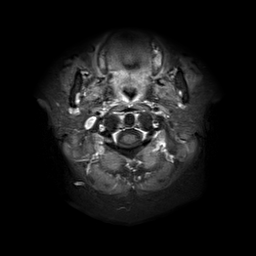
[im 23/160]
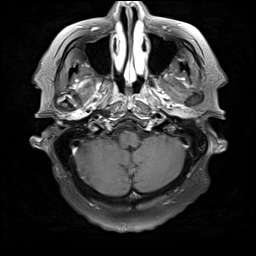
[im 46/160]
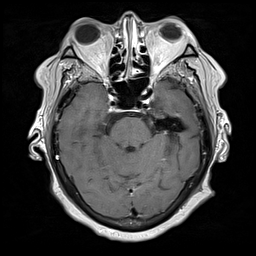
[im 69/160]
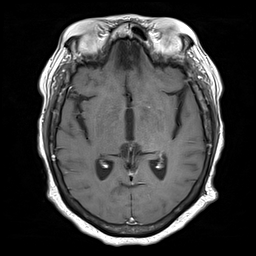
[im 91/160]
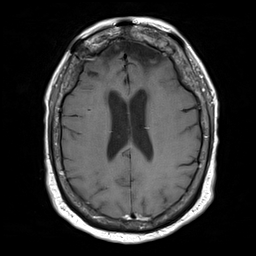
[im 114/160]
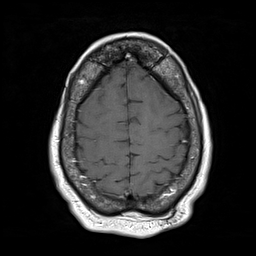
[im 137/160]
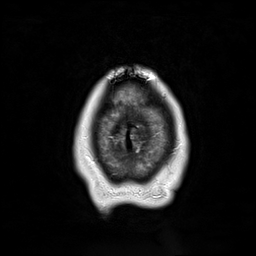
[im 160/160]
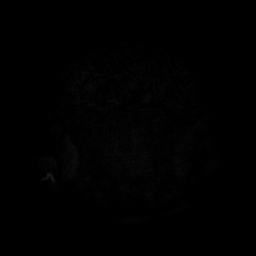

[Series 13: T1 post-contrast · axial · 1.0mm · 0.75mm/px · z∈[-114,+45]mm · 8 of 160 slices shown (1 of 2)]
[im 1/160]
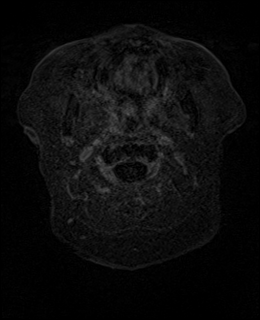
[im 23/160]
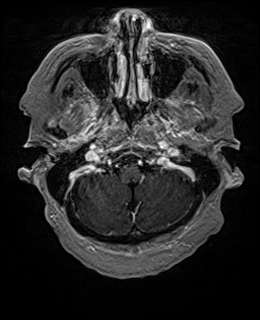
[im 46/160]
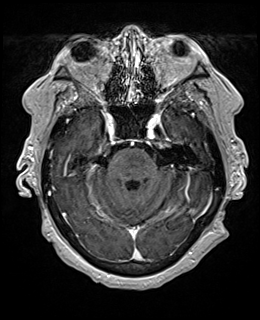
[im 69/160]
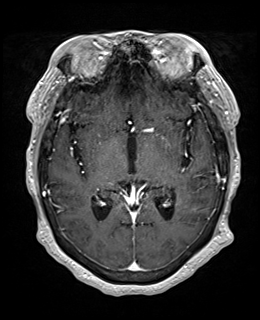
[im 91/160]
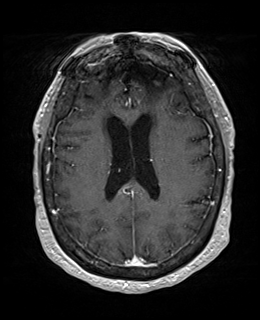
[im 114/160]
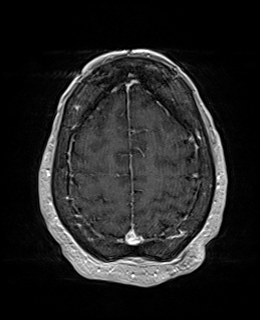
[im 137/160]
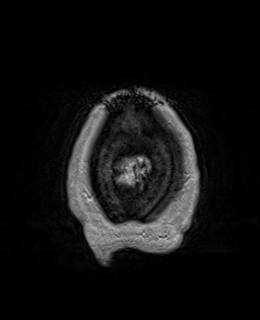
[im 160/160]
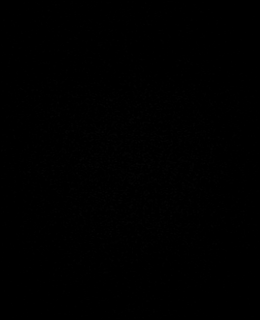

[Series 14: T1 post-contrast · coronal · 3.0mm · 0.57mm/px · 2 of 47 slices shown (2 of 2)]
[im 1/47]
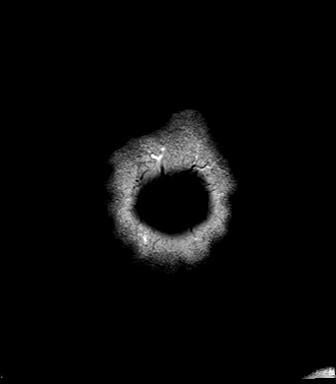
[im 47/47]
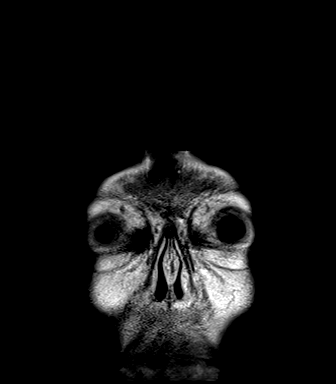

[Series 15: FLAIR post-contrast · sagittal · 3.0mm · 0.75mm/px · 2 of 39 slices shown]
[im 1/39]
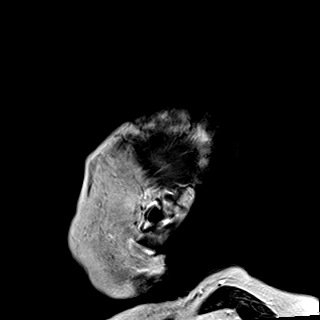
[im 39/39]
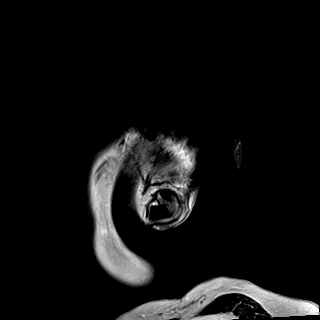

[48 of 48 positions shown; findings below may reference images not displayed]

FINDINGS: The study is mildly motion degraded.

Brain: There is no evidence of an acute infarct, midline shift, or
extra-axial fluid collection. The ventricles are normal in size for
age. Small foci of chronic hemorrhage in the right frontal lobe are
unchanged. Extensive nonspecific T2 hyperintensity in the pons is
also unchanged.

Sequelae of bifrontal craniotomy and tumor resection are again
identified with unchanged encephalomalacia in the anterior frontal
lobes. The residual enhancing mass along the planum sphenoidale
slightly right of midline is unchanged, measuring 10 x 8 mm (series
12, image 56). No new enhancing intracranial lesion is identified.

Vascular: Major intracranial vascular flow voids are preserved.

Skull and upper cervical spine: Bifrontal craniotomy.

Sinuses/Orbits: Bilateral cataract extraction. Paranasal sinuses and
mastoid air cells are clear.

Other: None.
IMPRESSION: Unchanged small residual planum sphenoidale meningioma.

## 2022-12-18 MED ORDER — MIRABEGRON ER 50 MG PO TB24
50.0000 mg | ORAL_TABLET | Freq: Every day | ORAL | 5 refills | Status: DC
Start: 2022-12-18 — End: 2023-02-27

## 2022-12-20 ENCOUNTER — Other Ambulatory Visit: Payer: Medicare HMO | Admitting: Pharmacist

## 2022-12-20 ENCOUNTER — Ambulatory Visit (HOSPITAL_COMMUNITY): Payer: Medicare HMO | Admitting: Psychiatry

## 2022-12-20 DIAGNOSIS — L2989 Other pruritus: Secondary | ICD-10-CM | POA: Diagnosis not present

## 2022-12-20 NOTE — Progress Notes (Signed)
HPI F Former smoker followed for obstructive sleep apnea with hypersomnia, , R/O'd Lung nodule,Wheezing dyspnea, complicated by hx resection of meningioma. Bipolar, GERD, DM, chronic epistaxis NPSG 3/8/13[name]- mild obstructive sleep apnea, AHI 14 per hour, mostly in REM. Epworth sleepiness score 14/24. Weight 164 pounds. She is living alone, divorced and disabled. Son has OSA. History of meningioma 11/23/2011. No seizure in over 3 years. Does not drive Office Spirometry 16/12/9602-[id number] effort based on appearance of loop and curve. Numbers would fit with severe restriction but her physiologic capability may be better than this. FVC 0.91/44%, FEV1 0.74/45%, FEV1/FVC 0.81, FEF 25-75% 1.43/69%. Office Spirometry 07/27/2015-weak effort but still better than her last previous trial effort. Mild restriction of exhaled volume. FVC 1.35/67%, FEV1 1.27/79%, FEV1/FVC 0.94, FEF 25-75 percent 2.14/107% NPSG 03/01/22- AHI 34.4/ hr, desaturation to 89%, body weight 166 lbs> CPAP auto 5-15 -=======================================================----  06/20/22- 71 yoF Smoker followed for Obstructive Sleep Apnea with hypersomnia, , R/O'd Lung nodule,Bronchitis/ COPD, tobacco, complicated by hx resection of meningioma. Bipolar/ Depression, GERD, DM,HTN,  chronic epistaxis, Covid infection [id number],  CPAP  auto 5-15/ [employer] Download- Compliance 40%, AHI 3.4/ hr Still smoking 5-10 cigs/ day -Neb Duoneb, Ventolin hfa, singulair, prednisone 20 mg daily, Stiolto 2.5, Atrovent nasal spray Body weight today-168 lbs ED in March for persistent L calf pain. Negative dopplers. Download reviewed.  She says CPAP helps-she feels much better in the mornings after using it but does not really explain why compliance is not better. [name] her ongoing smoking trying to press her to at least make an effort to quit.  Support offered. The dyspnea on exertion is unchanged.  She denies cough. Pending left shoulder  surgery.  12/21/22- 72 yoF Smoker followed for Obstructive Sleep Apnea with hypersomnia, , R/O'd Lung nodule, Bronchitis/ COPD, tobacco, complicated by hx resection of meningioma. Bipolar/ Depression, GERD, DM,HTN,  chronic epistaxis, Covid infection [name],  CPAP  auto 5-15/ [employer] Download- Compliance 10%, AHI 3.3/hr Still smoking 5-10 cigs/ day -Neb albuterol, Ventolin hfa, singulair, prednisone 20 mg daily, Stiolto 2.5, Atrovent nasal spray, Lorazepam, risperidone, trazodone, lamictal, wellbutrin,  Body weight today-162 lb She has an aide who helps, but she lives alone. Thinks she is brushing CPAP mask off in her sleep. We discussed compliance goals and mask fit. Willing to try nasal pillows mask. Thinks she is active and restless in sleep. We are going to try having her take her pregabalin at bedtime to see if that helps. Breathing ok. Has her inhaler meds and nebulizer.  ROS-see HPI + = positive Constitutional:   No-   weight loss, night sweats, fevers, chills, +fatigue, lassitude. HEENT:  + headaches, +difficulty swallowing, tooth/dental problems, sore throat,       No-sneezing, itching, ear ache, nasal congestion, post nasal drip,  CV:  chest pain, orthopnea, PND, swelling in lower extremities, anasarca, dizziness, +palpitations Resp: +  shortness of breath with exertion or at rest.                productive cough,   non-productive cough,  No- coughing up of blood.              No-   change in color of mucus.   wheezing.   Skin: No-   rash or lesions. GI:  +heartburn, +indigestion, no-abdominal pain, nausea, vomiting,  GU:  MS:  + joint pain or swelling.   Neuro-     nothing unusual Psych:  No- change in mood or affect. +depression or +anxiety.  No memory loss.  OBJ- Physical Exam - + she speaks slowly but seems alert and appropriate, +obese General-  Oriented, Affect-flat, Distress- none acute Skin- rash-none, lesions- none, excoriation- none Lymphadenopathy- none Head- Eyes-  Gross vision intact, PERRLA, conjunctivae and secretions clear            Ears- Hearing, canals-normal + = positive            Nose- no-Septal dev, polyps, erosion, perforation             Throat- Mallampati III , mucosa clear , drainage- none, tonsils- atrophic,  +hoarse, + dentures Neck- flexible , trachea midline, no stridor , thyroid nl, carotid no bruit Chest - symmetrical excursion , unlabored           Heart/CV- RRR , no murmur , no gallop  , no rub, nl s1 s2                           - JVD- none , edema- none, stasis changes- none, varices- none           Lung- clear, wheeze- none, cough- mild , dullness-none, rub- none           Chest wall-  Abd-  Br/ Gen/ Rectal- Not done, not indicated Extrem- cyanosis- none, clubbing, none, atrophy- none, strength- nl.   +rolling walker Neuro- + slow, halting speech

## 2022-12-21 ENCOUNTER — Encounter: Payer: Self-pay | Admitting: Internal Medicine

## 2022-12-21 ENCOUNTER — Ambulatory Visit (INDEPENDENT_AMBULATORY_CARE_PROVIDER_SITE_OTHER): Payer: Medicare HMO | Admitting: Internal Medicine

## 2022-12-21 VITALS — BP 157/75 | HR 74 | Ht 59.0 in | Wt 162.0 lb

## 2022-12-21 DIAGNOSIS — G4733 Obstructive sleep apnea (adult) (pediatric): Secondary | ICD-10-CM | POA: Diagnosis not present

## 2022-12-21 DIAGNOSIS — F17210 Nicotine dependence, cigarettes, uncomplicated: Secondary | ICD-10-CM | POA: Diagnosis not present

## 2022-12-21 NOTE — Patient Instructions (Signed)
Take your pregabalin at bedtime. That may help you sleep a little more calmly at night so you don't knock your CPAP mask off.  We want to keep try using your CPAP all night, every night.  Order- DME[facility]- please send nasal pillows mask to try. Continue auto 5-15, supplies, [employer]/ card

## 2022-12-26 ENCOUNTER — Ambulatory Visit (HOSPITAL_COMMUNITY)
Admission: RE | Admit: 2022-12-26 | Discharge: 2022-12-26 | Disposition: A | Discharge status: Home / Self Care | Payer: Medicare HMO | Source: Ambulatory Visit | Attending: Acute Care | Admitting: Acute Care

## 2022-12-26 ENCOUNTER — Ambulatory Visit (HOSPITAL_COMMUNITY): Payer: Medicare HMO

## 2022-12-26 DIAGNOSIS — F17210 Nicotine dependence, cigarettes, uncomplicated: Secondary | ICD-10-CM | POA: Diagnosis not present

## 2022-12-26 DIAGNOSIS — Z122 Encounter for screening for malignant neoplasm of respiratory organs: Secondary | ICD-10-CM | POA: Insufficient documentation

## 2022-12-26 DIAGNOSIS — Z87891 Personal history of nicotine dependence: Secondary | ICD-10-CM | POA: Insufficient documentation

## 2022-12-28 ENCOUNTER — Other Ambulatory Visit: Payer: Self-pay | Admitting: Internal Medicine

## 2022-12-28 ENCOUNTER — Other Ambulatory Visit (HOSPITAL_COMMUNITY): Payer: Self-pay | Admitting: Psychiatry

## 2023-01-01 ENCOUNTER — Ambulatory Visit: Payer: Medicare HMO

## 2023-01-03 ENCOUNTER — Telehealth: Payer: Self-pay | Admitting: Family Medicine

## 2023-01-03 ENCOUNTER — Ambulatory Visit (INDEPENDENT_AMBULATORY_CARE_PROVIDER_SITE_OTHER): Payer: Medicare HMO | Admitting: Psychiatry

## 2023-01-03 DIAGNOSIS — F331 Major depressive disorder, recurrent, moderate: Secondary | ICD-10-CM

## 2023-01-03 NOTE — Telephone Encounter (Signed)
Patient calling says there was supposed to be a therapist coming to her house but never showed up. Please advise Thanks

## 2023-01-03 NOTE — Progress Notes (Unsigned)
Virtual Visit via Telephone Note  I connected with [name] [name] on 01/03/23 at 3:10  PM EDT by telephone and verified that I am speaking with the correct person using two identifiers.  Location: Patient: Home Provider: [facility] [place] office    I discussed the limitations, risks, security and privacy concerns of performing an evaluation and management service by telephone and the availability of in person appointments. I also discussed with the patient that there may be a patient responsible charge related to this service. The patient expressed understanding and agreed to proceed.   I provided  50  minutes of non-face-to-face time during this encounter.   [name] Salvage, LCSW              THERAPIST PROGRESS NOTE    Session Time: Wednesday  10/23 3:10 PM - 4:00 PM   Participation Level: Active  Behavioral Response: less anxious, less depressed  Type of Therapy: Individual Therapy  Treatment Goals addressed: Patient will score less than 10 on the patient health questionnaireAnnie "[place]" WILL IDENTIFY 3 COGNITIVE PATTERNS AND BELIEFS THAT SUPPORT [name]   Progress on goals: Progressing  Interventions: CBT and Supportive             Summary: [name] [name] is a 72 y.o. female who presents with  long standing history of recurrent periods  of [name] beginning when she was [age] and her favorite uncle died. Patient reports multiple psychiatric hospitlaizations due to [name] and suicidal ideations with the last one occuring in 1997. Patient has participated in outpatient psychotherapy and medication management intermittently since age 36.  She currently is seeing psychiatrist Dr. Tenny [name] . Prior to this, she was [facility] Patient also has had ECT at [facility]. Symptoms have worsened in recent months due to family stress and have  included depressed mood, anxiety, excessive worry, and tearfulness.            Patient's last contact was by virtual visit via  telephone about 4 weeks ago.  Patient reports  stable mood and continued involvement in activity since last session. She is resuming church and reports recent interaction with her pastor.  She reports stress regarding one of her aides whom she suspected was taking items from her home. She also suspected aide may have given her either too much medicine or the wrong medicine.she also reports that a told lies on her to patient's neighbors and others.  However, the aide has quit.  Patient still has concerns about the aide as she reports the aide has a key to her apartment.  Patient will follow-up with home health agency to request he be returned.  She has not told the agency about her concerns about the age.  However, she plans to discuss her concerns with her son and hopes that they could address that issue together.  Despite all this, patient reports coping well.  She still uses her spirituality and distracting activities to cope.  Patient remains pleased with the other 2 aides who provide her care.    Suicidal/Homicidal: Nowithout intent/plan      Therapist Response: Reviewed symptoms, discussed stressors, facilitated expression of thoughts and feelings, validated feelings, encouraged patient to follow through with her plans to talk with her son regarding issues regarding the aid, praised and reinforced pt's efforts to improve assertiveness skills, praised and reinforced patient's continued use of helpful coping strategies, encouraged patient to maintain involvement in activity, discussed stepdown plan to termination plan to include 2  more sessions   Diagnosis: Axis I: MDD, Recurrent, moderate    Axis II: Deferred Collaboration of Care: Psychiatrist AEB patient working with psychiatrist Dr. Tenny [name]  Patient/Guardian was advised Release of Information must be obtained prior to any record release in order to collaborate their care with an outside provider. Patient/Guardian was advised if they have not already done  so to contact the registration department to sign all necessary forms in order for [name] to release information regarding their care.   Consent: Patient/Guardian gives verbal consent for treatment and assignment of benefits for services provided during this visit. Patient/Guardian expressed understanding and agreed to proceed.    [name] [name] E [name] Sopher, LCSW

## 2023-01-04 ENCOUNTER — Telehealth (HOSPITAL_COMMUNITY): Payer: Medicare HMO | Admitting: Psychiatry

## 2023-01-04 ENCOUNTER — Encounter (HOSPITAL_COMMUNITY): Payer: Self-pay | Admitting: Psychiatry

## 2023-01-04 DIAGNOSIS — F331 Major depressive disorder, recurrent, moderate: Secondary | ICD-10-CM

## 2023-01-04 MED ORDER — LORAZEPAM 0.5 MG PO TABS: 0.5000 mg | ORAL_TABLET | Freq: Two times a day (BID) | ORAL | 2 refills | Status: DC

## 2023-01-04 MED ORDER — LAMOTRIGINE 100 MG PO TABS: 100.0000 mg | ORAL_TABLET | Freq: Two times a day (BID) | ORAL | 2 refills | Status: DC

## 2023-01-04 MED ORDER — TRAZODONE HCL 150 MG PO TABS: 150.0000 mg | ORAL_TABLET | Freq: Every day | ORAL | 3 refills | Status: DC

## 2023-01-04 MED ORDER — RISPERIDONE 0.5 MG PO TABS: 0.5000 mg | ORAL_TABLET | Freq: Every day | ORAL | 2 refills | Status: DC

## 2023-01-04 MED ORDER — SERTRALINE HCL 100 MG PO TABS: 100.0000 mg | ORAL_TABLET | Freq: Every morning | ORAL | 3 refills | Status: DC

## 2023-01-04 NOTE — Progress Notes (Signed)
Virtual Visit via Telephone Note  I connected with [name] on 01/04/23 at  4:20 PM EDT by telephone and verified that I am speaking with the correct person using two identifiers.  Location: Patient: home Provider: office   I discussed the limitations, risks, security and privacy concerns of performing an evaluation and management service by telephone and the availability of in person appointments. I also discussed with the patient that there may be a patient responsible charge related to this service. The patient expressed understanding and agreed to proceed.      I discussed the assessment and treatment plan with the patient. The patient was provided an opportunity to ask questions and all were answered. The patient agreed with the plan and demonstrated an understanding of the instructions.   The patient was advised to call back or seek an in-person evaluation if the symptoms worsen or if the condition fails to improve as anticipated.  I provided 20 minutes of non-face-to-face time during this encounter.   Diannia Ruder, MD  [facility] MD/PA/NP OP Progress Note  01/04/2023 4:01 PM [name]  MRN:  [id number]  Chief Complaint:  Chief Complaint  Patient presents with   Depression   Anxiety   Follow-up   HPI: This patient is a 72 year old divorced white female who lives alone in [place]. She had worked in a [employer] but is now on disability.  The patient returns for follow-up after 3 months regarding her depression and anxiety.  She states that one of her home health aides was dealing with some of her medication and also she felt that this person was overmedicating her.  In fact on 911 she was in the emergency room because of over somnolence.  She had a lot of blood work CT scan of the brain and nothing really showed up as abnormal.  After few hours she became more awake and alert.  There was no urine toxicology done.  The aide has now quit and she has a filling aid who  she thinks is doing better.  She states her mood is generally stable but she no longer wants to take the Wellbutrin as she feels she is on too much medication and she is not really sure that it did anything to help.  I think this is reasonable.  She is sleeping well most of the time.  Visit Diagnosis:    ICD-10-CM   1. Major depressive disorder, recurrent episode, moderate (HCC)  F33.1       Past Psychiatric History: Numerous hospitalizations for depression years ago including ECT  Past Medical History:  Past Medical History:  Diagnosis Date   Allergy    Anemia    Anemia in chronic kidney disease (CKD) 08/10/2021   Anemia in chronic kidney disease (CKD) 10/12/2021   Anxiety    takes Ativan daily   Arthritis    Assistance needed for mobility    Bipolar disorder (HCC)    takes Risperdal nightly   Blood transfusion    Brain tumor (HCC)    Cancer (HCC)    In her gum   Carpal tunnel syndrome of right wrist 05/23/2011   Cervical disc disorder with radiculopathy of cervical region 10/31/2012   Chronic back pain    Chronic idiopathic constipation    Chronic neck and back pain    Colon polyps    COPD (chronic obstructive pulmonary disease) with chronic bronchitis (HCC) 09/16/2013   Office Spirometry 10/30/2013-submaximal effort based on appearance of loop and  curve. Numbers would fit with severe restriction but her physiologic capability may be better than this. FVC 0.91/44%, and 10.74/45%, FEV1/FVC 0.81, FEF 25-75% 1.43/69%     Diabetes mellitus    Type II   Diverticulosis    TCS 9/08 by Dr. Lina Sar for diarrhea . Bx for micro scopic colitis negative.    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   Metabolic encephalopathy 08/03/2011   Migraines    chronic headaches   Mononeuritis lower limb    Narcolepsy    Osteoporosis    Pancreatitis 2006   due to Depakote with normal EUS    Paralysis (HCC)    Pneumonia    Schatzki's ring    non critical / EGD with ED 8/2011with RMR   Seizures (HCC)    takes Lamictal daily.Last seizure 3 yrs ago   Sleep apnea    on CPAP   Small bowel obstruction (HCC)    Stroke (HCC)    left sided weakness, speech changes   Tubular adenoma of colon     Past Surgical History:  Procedure Laterality Date   ABDOMINAL HYSTERECTOMY  1978   BACK SURGERY  July 2012   BACTERIAL OVERGROWTH TEST N/A 05/05/2013   Procedure: BACTERIAL OVERGROWTH TEST;  Surgeon: Corbin Ade, MD;  Location: [facility];  Service: Endoscopy;  Laterality: N/A;  7:30   BIOPSY THYROID  2009   BRAIN SURGERY  11/2011   resection of meningioma   BREAST REDUCTION SURGERY  1994   CARDIAC CATHETERIZATION  05/10/2005   normal coronaries, normal LV systolic function and EF (Dr. Evlyn Courier)   CARPAL TUNNEL RELEASE Left 07/22/04   Dr. Romeo Apple   CATARACT EXTRACTION Bilateral    CHOLECYSTECTOMY  1984   COLONOSCOPY N/A 09/25/2012   VZD:GLOVFIE diverticulosis.  colonic polyp-removed : tubular adenoma   CRANIOTOMY  11/23/2011   Procedure: CRANIOTOMY TUMOR EXCISION;  Surgeon: Hewitt Shorts, MD;  Location: [facility]URO ORS;  Service: Neurosurgery;  Laterality: N/A;  Craniotomy for tumor resection   ESOPHAGOGASTRODUODENOSCOPY  12/29/2010   Rourk-Retained food in the esophagus and stomach, small hiatal hernia, status post Maloney dilation of the esophagus   ESOPHAGOGASTRODUODENOSCOPY N/A 09/25/2012   PPI:RJJOACZY atonic baggy esophagus status post Maloney dilation 56 F. Hiatal hernia    GIVENS CAPSULE STUDY N/A 01/15/2013   NORMAL.    IR GENERIC HISTORICAL  03/17/2016   IR RADIOLOGIST EVAL & MGMT 03/17/2016 [facility]-INTER[facility] RAD   LESION REMOVAL N/A 05/31/2015   Procedure: REMOVAL RIGHT AND LEFT LESIONS OF MANDIBLE;  Surgeon: Ocie Doyne, DDS;  Location: [facility] OR;  Service: Oral Surgery;  Laterality: N/A;   MALONEY DILATION  12/29/2010   RMR;   NM MYOCAR PERF WALL MOTION  2006   "relavtiely normal" persantine, mild anterior thinning (breast attenuation artifact), no region of scar/ischemia   OVARIAN CYST REMOVAL     RECTOCELE REPAIR N/A  06/29/2015   Procedure: POSTERIOR REPAIR (RECTOCELE);  Surgeon: Tilda Burrow, MD;  Location: [facility] ORS;  Service: Gynecology;  Laterality: N/A;   REDUCTION MAMMAPLASTY Bilateral    REVERSE SHOULDER ARTHROPLASTY Left 08/18/2022   Procedure: REVERSE SHOULDER ARTHROPLASTY;  Surgeon: Beverely Low, MD;  Location: [facility] ORS;  Service: Orthopedics;  Laterality: Left;  choice with interscalene, general   SPINE SURGERY  09/29/2010   Dr. Shon Baton   surgical excision of 3 tumors from right thigh and right buttock  and left upper thigh  2010   TOOTH EXTRACTION Bilateral 12/14/2014   Procedure: REMOVAL OF BILATERAL MANDIBULAR EXOSTOSES;  Surgeon: Ocie Doyne, DDS;  Location: [facility] OR;  Service: Oral Surgery;  Laterality: Bilateral;   TRANSTHORACIC ECHOCARDIOGRAM  2010   EF 60-65%, mild conc LVH, grade 1 diastolic dysfunction; mildly calcified MV annulus with mildly thickened leaflets, mildly calcified MR annulus    Family Psychiatric History: See below  Family History:  Family History  Problem Relation Age of Onset   Heart attack Mother        HTN   Pneumonia Father    Kidney failure Father    Diabetes Father    Pancreatic cancer Sister    Cancer Sister        breast    Cancer Sister        pancreatic   Diabetes Brother    Hypertension Brother    Diabetes Brother    Alcohol abuse Maternal Uncle    Stroke Maternal Grandmother    Heart attack Maternal  Grandfather    Hypertension Son    Sleep apnea Son    Colon cancer Neg Hx    Anesthesia problems Neg Hx    Hypotension Neg Hx    Malignant hyperthermia Neg Hx    Pseudochol deficiency Neg Hx    Breast cancer Neg Hx    Stomach cancer Neg Hx     Social History:  Social History   Socioeconomic History   Marital status: Legally Separated    Spouse name: Not on file   Number of children: 1   Years of education: 12   Highest education level: High school graduate  Occupational History   Occupation: Disabled  Tobacco Use   Smoking status: Former    Current packs/day: 0.00    Average packs/day: 0.5 packs/day for 47.0 years (23.5 ttl pk-yrs)    Types: Cigarettes    Start date: 07/06/1974    Quit date: 07/05/2021    Years since quitting: 1.5    Passive exposure: Past   Smokeless tobacco: Never   Tobacco comments:    3-4 a day   Vaping Use   Vaping status: Never Used  Substance and Sexual Activity   Alcohol use: No    Alcohol/week: 0.0 standard drinks of alcohol    Comment:     Drug use: No   Sexual activity: Not Currently  Other Topics Concern   Not on file  Social History Narrative   01/29/18 Lives alone, has 3 aides, Mon- Fri 8 hrs, 2 hrs on Sat-Sun, RN manages her meds   Caffeine use: Drink coffee sometimes    Right handed    Social Determinants of Health   Financial Resource Strain: Low Risk  (07/17/2022)   Overall Financial Resource Strain (CARDIA)    Difficulty of Paying Living Expenses: Not very hard  Food Insecurity: No Food Insecurity (10/30/2022)   Hunger Vital Sign    Worried About Running Out of Food in the Last  Year: Never true    Ran Out of Food in the Last Year: Never true  Transportation Needs: No Transportation Needs (10/30/2022)   PRAPARE - Administrator, Civil Service (Medical): No    Lack of Transportation (Non-Medical): No  Physical Activity: Insufficiently Active (07/17/2022)   Exercise Vital Sign    Days of Exercise per Week: 2 days     Minutes of Exercise per Session: 20 min  Stress: No Stress Concern Present (07/17/2022)   [employer] of Occupational Health - Occupational Stress Questionnaire    Feeling of Stress : Not at all  Social Connections: Moderately Integrated (07/17/2022)   Social Connection and Isolation Panel [NHANES]    Frequency of Communication with Friends and Family: Three times a week    Frequency of Social Gatherings with Friends and Family: Once a week    Attends Religious Services: More than 4 times per year    Active Member of [employer] or Organizations: Yes    Attends Engineer, structural: More than 4 times per year    Marital Status: Divorced    Allergies:  Allergies  Allergen Reactions   Iron Itching and Nausea And Vomiting   Milk (Cow) Rash and Other (See Comments)    Doesn't agree with the stomach, also    Penicillins Hives   Phenazopyridine Hives   Cephalexin Hives   Flonase [Fluticasone] Other (See Comments)    "It gave me ulcers in my nose"   Milk-Related Compounds Nausea Only and Other (See Comments)    Doesn't agree with the stomach    Metabolic Disorder Labs: Lab Results  Component Value Date   HGBA1C 5.3 11/03/2022   MPG 103 08/08/2022   MPG 114.02 09/16/2021   No results found for: "PROLACTIN" Lab Results  Component Value Date   CHOL 123 06/29/2022   TRIG 81 06/29/2022   HDL 54 06/29/2022   CHOLHDL 2.3 06/29/2022   VLDL 26 07/19/2015   LDLCALC 53 06/29/2022   LDLCALC 79 05/10/2021   Lab Results  Component Value Date   TSH 0.681 11/22/2022   TSH 1.060 06/29/2022    Therapeutic Level Labs: Lab Results  Component Value Date   LITHIUM <0.06 (L) 10/15/2016   Lab Results  Component Value Date   VALPROATE <10.0 (L) 08/23/2007   No results found for: "CBMZ"  Current Medications: Current Outpatient Medications  Medication Sig Dispense Refill   HYDROcodone-acetaminophen (NORCO/VICODIN) 5-325 MG tablet Take 1 tablet 3 times a day by oral route as  needed for 5 days.     Accu-Chek Softclix Lancets lancets TEST BLOOD SUGAR THREE TIMES DAILY AS DIRECTED 300 each 2   acetaminophen (TYLENOL) 325 MG tablet Take 650 mg by mouth every 6 (six) hours as needed for mild pain, moderate pain or headache.     Alcohol Swabs (DROPSAFE ALCOHOL PREP) 70 % PADS USE TO CLEAN FINGER PRIOR TO TESTING FOR BLOOD SUGAR AS DIRECTED 300 each 3   alendronate (FOSAMAX) 70 MG tablet TAKE 1 TABLET EVERY 7 DAYS ON AN EMPTY STOMACH WITH A FULL GLASS OF WATER (Patient taking differently: Take 70 mg by mouth once a week.) 12 tablet 3   amLODipine (NORVASC) 10 MG tablet TAKE 1 TABLET EVERY DAY (NEED MD APPOINTMENT) 30 tablet 7   aspirin EC 81 MG tablet Take 1 tablet (81 mg total) by mouth daily with breakfast. 120 tablet 2   Blood Glucose Calibration (ACCU-CHEK GUIDE CONTROL) LIQD USE AS DIRECTED 1 each 0  blood glucose meter kit and supplies Dispense based on patient and insurance preference. Use up to four times daily as directed. (FOR ICD-10 E10.9, E11.9). 1 each 0   buPROPion (WELLBUTRIN XL) 150 MG 24 hr tablet Take 1 tablet (150 mg total) by mouth every morning. 90 tablet 2   carvedilol (COREG) 12.5 MG tablet Take 12.5 mg by mouth 2 (two) times daily with a meal.     Cholecalciferol (D3 PO) Take 1 tablet by mouth daily.     Continuous Blood Gluc Sensor (FREESTYLE LIBRE 14 DAY SENSOR) MISC 1 each by Does not apply route every 14 (fourteen) days. Change every 2 weeks 2 each 11   Docusate Sodium (DSS) 100 MG CAPS Take 100 mg by mouth in the morning and at bedtime.     DROPLET PEN NEEDLES 31G X 8 MM MISC USE FOR INJECTING INSULIN 4 TIMES DAILY. 400 each 0   Elastic Bandages & Supports (ADJUSTABLE ARM SLING) MISC L arm sling 1 each 0   ezetimibe (ZETIA) 10 MG tablet TAKE 1 TABLET EVERY DAY 90 tablet 10   glucose blood (ACCU-CHEK GUIDE) test strip Use as instructed 400 strip 3   hydrochlorothiazide (MICROZIDE) 12.5 MG capsule Take 12.5 mg by mouth daily.     insulin aspart  (NOVOLOG FLEXPEN) 100 UNIT/ML FlexPen Inject 5-8 Units into the skin 3 (three) times daily with meals. 30 mL 3   insulin glargine, 2 Unit Dial, (TOUJEO MAX SOLOSTAR) 300 UNIT/ML Solostar Pen Inject 20-26 Units into the skin at bedtime. (Patient taking differently: Inject 23 Units into the skin at bedtime.) 9 mL 1   ipratropium-albuterol (DUONEB) 0.5-2.5 (3) MG/3ML SOLN Take 3 mLs by nebulization every 6 (six) hours as needed. 360 mL 12   ketoconazole (NIZORAL) 2 % shampoo Apply 1 Application topically 2 (two) times a week. 120 mL 0   lamoTRIgine (LAMICTAL) 100 MG tablet Take 1 tablet (100 mg total) by mouth 2 (two) times daily. 180 tablet 2   levothyroxine (SYNTHROID) 50 MCG tablet TAKE 1 TABLET DAILY SIX DAYS A WEEK AND 1/2 TABLET ON ONE DAY A WEEK (Patient taking differently: Take 50 mcg by mouth See admin instructions. Take 50 mg by mouth for 6 days a week and 25 mg on one day a week) 85 tablet 2   LORazepam (ATIVAN) 0.5 MG tablet Take 1 tablet (0.5 mg total) by mouth 2 (two) times daily. 60 tablet 2   metoprolol tartrate (LOPRESSOR) 50 MG tablet TAKE 1 TABLET TWICE DAILY (NEED MD APPOINTMENT) (Patient taking differently: Take 50 mg by mouth 2 (two) times daily. TAKE 1 TABLET TWICE DAILY (NEED MD APPOINTMENT)) 180 tablet 1   mirabegron ER (MYRBETRIQ) 50 MG TB24 tablet Take 1 tablet (50 mg total) by mouth daily. 30 tablet 5   Misc. Devices (MATTRESS PAD) MISC FIRM MATTRESS PAD for hospital bed x 1 1 each 0   montelukast (SINGULAIR) 10 MG tablet Take 10 mg by mouth daily.     Multiple Vitamins-Minerals (MULTIVITAMIN GUMMIES ADULT PO) Take 1 tablet by mouth daily.     nicotine (NICODERM CQ) 21 mg/24hr patch Place 1 patch (21 mg total) onto the skin daily. 28 patch 0   ondansetron (ZOFRAN) 4 MG tablet Take one tablet by mouth once daily, as needed , for nausea 30 tablet 2   potassium chloride (KLOR-CON) 10 MEQ tablet Take 1 tablet (10 mEq total) by mouth 2 (two) times daily. 30 tablet 2   pregabalin  (LYRICA) 75 MG capsule  Take 75 mg by mouth daily.     RABEprazole (ACIPHEX) 20 MG tablet TAKE 1 TABLET TWICE DAILY 180 tablet 0   RESTASIS 0.05 % ophthalmic emulsion Place 2 drops into both eyes daily.     risperiDONE (RISPERDAL) 0.5 MG tablet Take 1 tablet (0.5 mg total) by mouth at bedtime. 90 tablet 2   rosuvastatin (CRESTOR) 5 MG tablet TAKE 1 TABLET AT BEDTIME 90 tablet 3   sertraline (ZOLOFT) 100 MG tablet Take 1 tablet (100 mg total) by mouth every morning. 90 tablet 3   spironolactone (ALDACTONE) 50 MG tablet TAKE 1 TABLET (50 MG TOTAL) BY MOUTH DAILY. DISCONTINUE SPIRONOLACTONE 25MG  (Patient taking differently: Take 50 mg by mouth daily. Discontinue Spironolactone 25 mg) 90 tablet 3   telmisartan (MICARDIS) 20 MG tablet Take 10 mg by mouth daily.     traZODone (DESYREL) 150 MG tablet Take 1 tablet (150 mg total) by mouth at bedtime. 90 tablet 3   UNABLE TO FIND Med Name:  G5 Mattress 1 each 0   vitamin E 180 MG (400 UNITS) capsule Take 400 Units by mouth daily.     No current facility-administered medications for this visit.     Musculoskeletal: Strength & Muscle Tone: na Gait & Station: na Patient leans: N/A  Psychiatric Specialty Exam: Review of Systems  Musculoskeletal:  Positive for arthralgias, back pain and gait problem.  All other systems reviewed and are negative.   There were no vitals taken for this visit.There is no height or weight on file to calculate BMI.  General Appearance: NA  Eye Contact:  NA  Speech:  Clear and Coherent  Volume:  Normal  Mood:  Euthymic  Affect:  NA  Thought Process:  Goal Directed  Orientation:  Full (Time, Place, and Person)  Thought Content: Rumination   Suicidal Thoughts:  No  Homicidal Thoughts:  No  Memory:  Immediate;   Good Recent;   Good Remote;   NA  Judgement:  Good  Insight:  Fair  Psychomotor Activity:  Decreased  Concentration:  Concentration: Fair and Attention Span: Fair  Recall:  [employer] of Knowledge: Fair   Language: Good  Akathisia:  No  Handed:  right  AIMS (if indicated): not done  Assets:  Communication Skills Desire for Improvement Resilience Social Support  ADL's:  Intact  Cognition: WNL  Sleep:  Good   Screenings: GAD-7    Flowsheet Row Office Visit from 10/20/2022 in [facility] Office Visit from 04/13/2022 in [facility] Office Visit from 03/09/2022 in [facility]  Total GAD-7 Score 0 4 8      Mini-Mental    Flowsheet Row Office Visit from 11/24/2020 in [facility] Office Visit from 02/03/2019 in [facility] Office Visit from 09/08/2015 in [facility] Office Visit from 07/02/2014 in [facility] Office Visit from 12/08/2013 in [facility]  Total Score (max 30 points ) 23 27 24 26 27       PHQ2-9    Flowsheet Row Office Visit from 12/12/2022 in [facility] Office Visit from 10/20/2022 in [facility] Office Visit from 10/11/2022 in [facility] Office Visit from 08/03/2022 in [facility] Office Visit from 07/17/2022 in [facility]  PHQ-2 Total Score 0 0 0 2 2      Flowsheet  Row ED from 11/22/2022 in [facility] ED from 10/26/2022 in [facility] Emergency Department at [facility] Admission (Discharged) from 08/18/2022 in [facility]  C-SSRS RISK CATEGORY No Risk No Risk No Risk        Assessment and Plan: This patient is a 72 year old female with a history of depression anxiety and mood swings.  She seems more logical and coherent at this time.  Despite the problems with her home health aide she seems to be in good spirits.  She does not feel like she needs the Wellbutrin but we will continue the Zoloft 100 mg  daily for depression, Lamictal 100 mg twice daily for mood stabilization, trazodone 150 mg at bedtime for sleep, Risperdal 0.5 mg at bedtime for mood stabilization and Ativan 0.5 mg twice daily for anxiety.  She will return to see me in 3 months  Collaboration of Care: Collaboration of Care: Referral or follow-up with counselor/therapist AEB patient will continue therapy with Florencia Reasons in our office  Patient/Guardian was advised Release of Information must be obtained prior to any record release in order to collaborate their care with an outside provider. Patient/Guardian was advised if they have not already done so to contact the registration department to sign all necessary forms in order for [name] to release information regarding their care.   Consent: Patient/Guardian gives verbal consent for treatment and assignment of benefits for services provided during this visit. Patient/Guardian expressed understanding and agreed to proceed.    Diannia Ruder, MD 01/04/2023, 4:01 PM

## 2023-01-09 ENCOUNTER — Inpatient Hospital Stay: Payer: Medicare HMO

## 2023-01-09 ENCOUNTER — Ambulatory Visit (INDEPENDENT_AMBULATORY_CARE_PROVIDER_SITE_OTHER): Payer: Medicare HMO | Admitting: Podiatry

## 2023-01-09 ENCOUNTER — Encounter: Payer: Self-pay | Admitting: Podiatry

## 2023-01-09 ENCOUNTER — Ambulatory Visit: Payer: Medicare HMO | Admitting: Podiatry

## 2023-01-09 DIAGNOSIS — B351 Tinea unguium: Secondary | ICD-10-CM | POA: Diagnosis not present

## 2023-01-09 DIAGNOSIS — E1151 Type 2 diabetes mellitus with diabetic peripheral angiopathy without gangrene: Secondary | ICD-10-CM

## 2023-01-09 DIAGNOSIS — M79676 Pain in unspecified toe(s): Secondary | ICD-10-CM | POA: Diagnosis not present

## 2023-01-09 NOTE — Progress Notes (Signed)
Subjective:  Patient ID: [name], female    DOB: Jul 13, 1950,   MRN: [id number]  Chief Complaint  Patient presents with   [facility]    RM#22 [facility] nail trim    72 y.o. female presents for concern of thickened elongated and painful nails that are difficult to trim. Requesting to have them trimmed today. Relates burning and tingling in their feet. Patient is diabetic and last A1c was  Lab Results  Component Value Date   HGBA1C 5.3 11/03/2022   .   PCP:  Kerri Perches, MD    . Denies any other pedal complaints. Denies n/v/f/c.   Past Medical History:  Diagnosis Date   Allergy    Anemia    Anemia in chronic kidney disease (CKD) 08/10/2021   Anemia in chronic kidney disease (CKD) 10/12/2021   Anxiety    takes Ativan daily   Arthritis    Assistance needed for mobility    Bipolar disorder (HCC)    takes Risperdal nightly   Blood transfusion    Brain tumor (HCC)    Cancer (HCC)    In her gum   Carpal tunnel syndrome of right wrist 05/23/2011   Cervical disc disorder with radiculopathy of cervical region 10/31/2012   Chronic back pain    Chronic idiopathic constipation    Chronic neck and back pain    Colon polyps    COPD (chronic obstructive pulmonary disease) with chronic bronchitis (HCC) 09/16/2013   Office Spirometry 10/30/2013-submaximal effort based on appearance of loop and curve. Numbers would fit with severe restriction but her physiologic capability may be better than this. FVC 0.91/44%, and 10.74/45%, FEV1/FVC 0.81, FEF 25-75% 1.43/69%     Diabetes mellitus    Type II   Diverticulosis    TCS 9/08 by Dr. Lina Sar for diarrhea . Bx for micro scopic colitis negative.    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 CFT <3 seconds. Absent hair growth on digits. Edema noted to bilateral lower extremities. Xerosis noted bilaterally.  Skin. No lacerations or abrasions bilateral feet. Nails 1-5 bilateral  are thickened discolored and elongated with subungual debris.  Musculoskeletal: MMT 5/5 bilateral  lower extremities in DF, PF, Inversion and Eversion. Deceased ROM in DF of ankle joint.  Neurological: Sensation intact to light touch. Protective sensation diminished bilateral.    Assessment:   1. Pain due to onychomycosis of toenail   2. Type II diabetes mellitus with peripheral circulatory disorder (HCC)      Plan:  Patient was evaluated and treated and all questions answered. -Discussed and educated patient on diabetic foot care, especially with  regards to the vascular, neurological and musculoskeletal systems.  -Stressed the importance of good glycemic control and the detriment of not  controlling glucose levels in relation to the foot. -Discussed supportive shoes at all times and checking feet regularly.  -Mechanically debrided all nails 1-5 bilateral using sterile nail nipper and filed  with dremel without incident  -Answered all patient questions -Patient to return  in 3 months for at risk foot care -Patient advised to call the office if any problems or questions arise in the meantime.   Louann Sjogren, DPM

## 2023-01-12 ENCOUNTER — Emergency Department (HOSPITAL_COMMUNITY): Payer: Medicare HMO

## 2023-01-12 ENCOUNTER — Other Ambulatory Visit: Payer: Self-pay

## 2023-01-12 ENCOUNTER — Encounter (HOSPITAL_COMMUNITY): Payer: Self-pay

## 2023-01-12 ENCOUNTER — Inpatient Hospital Stay (HOSPITAL_COMMUNITY)
Admission: EM | Admit: 2023-01-12 | Discharge: 2023-01-27 | DRG: 100 | Disposition: A | Discharge status: Skilled Nursing Facility | Payer: Medicare HMO | Attending: Internal Medicine | Admitting: Internal Medicine

## 2023-01-12 DIAGNOSIS — G839 Paralytic syndrome, unspecified: Secondary | ICD-10-CM | POA: Diagnosis present

## 2023-01-12 DIAGNOSIS — F17200 Nicotine dependence, unspecified, uncomplicated: Secondary | ICD-10-CM | POA: Diagnosis not present

## 2023-01-12 DIAGNOSIS — I69323 Fluency disorder following cerebral infarction: Secondary | ICD-10-CM | POA: Diagnosis not present

## 2023-01-12 DIAGNOSIS — Z841 Family history of disorders of kidney and ureter: Secondary | ICD-10-CM

## 2023-01-12 DIAGNOSIS — E860 Dehydration: Secondary | ICD-10-CM | POA: Diagnosis present

## 2023-01-12 DIAGNOSIS — G43909 Migraine, unspecified, not intractable, without status migrainosus: Secondary | ICD-10-CM | POA: Diagnosis present

## 2023-01-12 DIAGNOSIS — Z9889 Other specified postprocedural states: Secondary | ICD-10-CM

## 2023-01-12 DIAGNOSIS — R569 Unspecified convulsions: Secondary | ICD-10-CM | POA: Diagnosis not present

## 2023-01-12 DIAGNOSIS — N3281 Overactive bladder: Secondary | ICD-10-CM | POA: Diagnosis present

## 2023-01-12 DIAGNOSIS — Z96612 Presence of left artificial shoulder joint: Secondary | ICD-10-CM | POA: Diagnosis not present

## 2023-01-12 DIAGNOSIS — F319 Bipolar disorder, unspecified: Secondary | ICD-10-CM | POA: Diagnosis present

## 2023-01-12 DIAGNOSIS — F339 Major depressive disorder, recurrent, unspecified: Secondary | ICD-10-CM | POA: Diagnosis not present

## 2023-01-12 DIAGNOSIS — Z602 Problems related to living alone: Secondary | ICD-10-CM | POA: Diagnosis present

## 2023-01-12 DIAGNOSIS — R410 Disorientation, unspecified: Secondary | ICD-10-CM | POA: Diagnosis not present

## 2023-01-12 DIAGNOSIS — M1990 Unspecified osteoarthritis, unspecified site: Secondary | ICD-10-CM | POA: Diagnosis present

## 2023-01-12 DIAGNOSIS — Z7409 Other reduced mobility: Secondary | ICD-10-CM | POA: Diagnosis present

## 2023-01-12 DIAGNOSIS — G4733 Obstructive sleep apnea (adult) (pediatric): Secondary | ICD-10-CM | POA: Diagnosis present

## 2023-01-12 DIAGNOSIS — Z716 Tobacco abuse counseling: Secondary | ICD-10-CM

## 2023-01-12 DIAGNOSIS — Z794 Long term (current) use of insulin: Secondary | ICD-10-CM

## 2023-01-12 DIAGNOSIS — I69354 Hemiplegia and hemiparesis following cerebral infarction affecting left non-dominant side: Secondary | ICD-10-CM

## 2023-01-12 DIAGNOSIS — J9811 Atelectasis: Secondary | ICD-10-CM | POA: Diagnosis present

## 2023-01-12 DIAGNOSIS — Z79899 Other long term (current) drug therapy: Secondary | ICD-10-CM

## 2023-01-12 DIAGNOSIS — Z90710 Acquired absence of both cervix and uterus: Secondary | ICD-10-CM

## 2023-01-12 DIAGNOSIS — Z9841 Cataract extraction status, right eye: Secondary | ICD-10-CM

## 2023-01-12 DIAGNOSIS — Z471 Aftercare following joint replacement surgery: Secondary | ICD-10-CM | POA: Diagnosis not present

## 2023-01-12 DIAGNOSIS — Z91011 Allergy to milk products: Secondary | ICD-10-CM

## 2023-01-12 DIAGNOSIS — J449 Chronic obstructive pulmonary disease, unspecified: Secondary | ICD-10-CM | POA: Diagnosis present

## 2023-01-12 DIAGNOSIS — E669 Obesity, unspecified: Secondary | ICD-10-CM | POA: Diagnosis present

## 2023-01-12 DIAGNOSIS — I10 Essential (primary) hypertension: Secondary | ICD-10-CM | POA: Diagnosis present

## 2023-01-12 DIAGNOSIS — M810 Age-related osteoporosis without current pathological fracture: Secondary | ICD-10-CM | POA: Diagnosis present

## 2023-01-12 DIAGNOSIS — R296 Repeated falls: Secondary | ICD-10-CM | POA: Diagnosis present

## 2023-01-12 DIAGNOSIS — K5904 Chronic idiopathic constipation: Secondary | ICD-10-CM | POA: Diagnosis present

## 2023-01-12 DIAGNOSIS — Z79890 Hormone replacement therapy (postmenopausal): Secondary | ICD-10-CM

## 2023-01-12 DIAGNOSIS — E1165 Type 2 diabetes mellitus with hyperglycemia: Secondary | ICD-10-CM | POA: Diagnosis present

## 2023-01-12 DIAGNOSIS — G9389 Other specified disorders of brain: Secondary | ICD-10-CM | POA: Diagnosis not present

## 2023-01-12 DIAGNOSIS — Z836 Family history of other diseases of the respiratory system: Secondary | ICD-10-CM

## 2023-01-12 DIAGNOSIS — Z860101 Personal history of adenomatous and serrated colon polyps: Secondary | ICD-10-CM

## 2023-01-12 DIAGNOSIS — F411 Generalized anxiety disorder: Secondary | ICD-10-CM | POA: Diagnosis not present

## 2023-01-12 DIAGNOSIS — I672 Cerebral atherosclerosis: Secondary | ICD-10-CM | POA: Diagnosis not present

## 2023-01-12 DIAGNOSIS — R350 Frequency of micturition: Secondary | ICD-10-CM | POA: Diagnosis present

## 2023-01-12 DIAGNOSIS — Z9842 Cataract extraction status, left eye: Secondary | ICD-10-CM

## 2023-01-12 DIAGNOSIS — R4182 Altered mental status, unspecified: Secondary | ICD-10-CM | POA: Diagnosis not present

## 2023-01-12 DIAGNOSIS — G40909 Epilepsy, unspecified, not intractable, without status epilepticus: Principal | ICD-10-CM | POA: Diagnosis present

## 2023-01-12 DIAGNOSIS — D509 Iron deficiency anemia, unspecified: Secondary | ICD-10-CM | POA: Diagnosis not present

## 2023-01-12 DIAGNOSIS — Z881 Allergy status to other antibiotic agents status: Secondary | ICD-10-CM

## 2023-01-12 DIAGNOSIS — G4700 Insomnia, unspecified: Secondary | ICD-10-CM | POA: Diagnosis present

## 2023-01-12 DIAGNOSIS — E782 Mixed hyperlipidemia: Secondary | ICD-10-CM | POA: Diagnosis not present

## 2023-01-12 DIAGNOSIS — I130 Hypertensive heart and chronic kidney disease with heart failure and stage 1 through stage 4 chronic kidney disease, or unspecified chronic kidney disease: Secondary | ICD-10-CM | POA: Diagnosis present

## 2023-01-12 DIAGNOSIS — Z86018 Personal history of other benign neoplasm: Secondary | ICD-10-CM | POA: Diagnosis not present

## 2023-01-12 DIAGNOSIS — E1140 Type 2 diabetes mellitus with diabetic neuropathy, unspecified: Secondary | ICD-10-CM | POA: Diagnosis present

## 2023-01-12 DIAGNOSIS — Z880 Allergy status to penicillin: Secondary | ICD-10-CM

## 2023-01-12 DIAGNOSIS — N179 Acute kidney failure, unspecified: Secondary | ICD-10-CM | POA: Diagnosis present

## 2023-01-12 DIAGNOSIS — N182 Chronic kidney disease, stage 2 (mild): Secondary | ICD-10-CM | POA: Diagnosis present

## 2023-01-12 DIAGNOSIS — N189 Chronic kidney disease, unspecified: Secondary | ICD-10-CM | POA: Diagnosis not present

## 2023-01-12 DIAGNOSIS — G928 Other toxic encephalopathy: Secondary | ICD-10-CM | POA: Diagnosis present

## 2023-01-12 DIAGNOSIS — E1122 Type 2 diabetes mellitus with diabetic chronic kidney disease: Secondary | ICD-10-CM | POA: Diagnosis present

## 2023-01-12 DIAGNOSIS — E119 Type 2 diabetes mellitus without complications: Secondary | ICD-10-CM | POA: Diagnosis not present

## 2023-01-12 DIAGNOSIS — N3941 Urge incontinence: Secondary | ICD-10-CM | POA: Diagnosis present

## 2023-01-12 DIAGNOSIS — Z85818 Personal history of malignant neoplasm of other sites of lip, oral cavity, and pharynx: Secondary | ICD-10-CM

## 2023-01-12 DIAGNOSIS — E039 Hypothyroidism, unspecified: Secondary | ICD-10-CM | POA: Diagnosis present

## 2023-01-12 DIAGNOSIS — M6281 Muscle weakness (generalized): Secondary | ICD-10-CM | POA: Diagnosis not present

## 2023-01-12 DIAGNOSIS — M797 Fibromyalgia: Secondary | ICD-10-CM | POA: Diagnosis present

## 2023-01-12 DIAGNOSIS — N390 Urinary tract infection, site not specified: Secondary | ICD-10-CM | POA: Diagnosis not present

## 2023-01-12 DIAGNOSIS — I509 Heart failure, unspecified: Secondary | ICD-10-CM | POA: Diagnosis present

## 2023-01-12 DIAGNOSIS — Z86011 Personal history of benign neoplasm of the brain: Secondary | ICD-10-CM

## 2023-01-12 DIAGNOSIS — G9341 Metabolic encephalopathy: Secondary | ICD-10-CM | POA: Diagnosis not present

## 2023-01-12 DIAGNOSIS — E875 Hyperkalemia: Secondary | ICD-10-CM | POA: Diagnosis not present

## 2023-01-12 DIAGNOSIS — R404 Transient alteration of awareness: Secondary | ICD-10-CM | POA: Diagnosis not present

## 2023-01-12 DIAGNOSIS — Z800 Family history of malignant neoplasm of digestive organs: Secondary | ICD-10-CM

## 2023-01-12 DIAGNOSIS — G47419 Narcolepsy without cataplexy: Secondary | ICD-10-CM | POA: Diagnosis present

## 2023-01-12 DIAGNOSIS — Z8719 Personal history of other diseases of the digestive system: Secondary | ICD-10-CM

## 2023-01-12 DIAGNOSIS — Z8249 Family history of ischemic heart disease and other diseases of the circulatory system: Secondary | ICD-10-CM

## 2023-01-12 DIAGNOSIS — Z888 Allergy status to other drugs, medicaments and biological substances status: Secondary | ICD-10-CM

## 2023-01-12 DIAGNOSIS — Z9049 Acquired absence of other specified parts of digestive tract: Secondary | ICD-10-CM

## 2023-01-12 DIAGNOSIS — F419 Anxiety disorder, unspecified: Secondary | ICD-10-CM | POA: Diagnosis present

## 2023-01-12 DIAGNOSIS — D631 Anemia in chronic kidney disease: Secondary | ICD-10-CM | POA: Diagnosis present

## 2023-01-12 DIAGNOSIS — Z833 Family history of diabetes mellitus: Secondary | ICD-10-CM

## 2023-01-12 DIAGNOSIS — Z8701 Personal history of pneumonia (recurrent): Secondary | ICD-10-CM

## 2023-01-12 DIAGNOSIS — M549 Dorsalgia, unspecified: Secondary | ICD-10-CM | POA: Diagnosis present

## 2023-01-12 DIAGNOSIS — S0990XA Unspecified injury of head, initial encounter: Secondary | ICD-10-CM | POA: Diagnosis not present

## 2023-01-12 DIAGNOSIS — K219 Gastro-esophageal reflux disease without esophagitis: Secondary | ICD-10-CM | POA: Diagnosis present

## 2023-01-12 DIAGNOSIS — H409 Unspecified glaucoma: Secondary | ICD-10-CM | POA: Diagnosis present

## 2023-01-12 DIAGNOSIS — F17210 Nicotine dependence, cigarettes, uncomplicated: Secondary | ICD-10-CM | POA: Diagnosis present

## 2023-01-12 DIAGNOSIS — R0989 Other specified symptoms and signs involving the circulatory and respiratory systems: Secondary | ICD-10-CM | POA: Diagnosis not present

## 2023-01-12 DIAGNOSIS — R400 Somnolence: Principal | ICD-10-CM

## 2023-01-12 DIAGNOSIS — Z7401 Bed confinement status: Secondary | ICD-10-CM | POA: Diagnosis not present

## 2023-01-12 DIAGNOSIS — S199XXA Unspecified injury of neck, initial encounter: Secondary | ICD-10-CM | POA: Diagnosis not present

## 2023-01-12 DIAGNOSIS — G4089 Other seizures: Secondary | ICD-10-CM | POA: Diagnosis not present

## 2023-01-12 DIAGNOSIS — Z811 Family history of alcohol abuse and dependence: Secondary | ICD-10-CM

## 2023-01-12 DIAGNOSIS — G8929 Other chronic pain: Secondary | ICD-10-CM | POA: Diagnosis present

## 2023-01-12 DIAGNOSIS — R0600 Dyspnea, unspecified: Secondary | ICD-10-CM | POA: Diagnosis not present

## 2023-01-12 DIAGNOSIS — Z8673 Personal history of transient ischemic attack (TIA), and cerebral infarction without residual deficits: Secondary | ICD-10-CM

## 2023-01-12 DIAGNOSIS — M5010 Cervical disc disorder with radiculopathy, unspecified cervical region: Secondary | ICD-10-CM | POA: Diagnosis present

## 2023-01-12 DIAGNOSIS — R42 Dizziness and giddiness: Secondary | ICD-10-CM | POA: Diagnosis not present

## 2023-01-12 DIAGNOSIS — Z803 Family history of malignant neoplasm of breast: Secondary | ICD-10-CM

## 2023-01-12 DIAGNOSIS — R739 Hyperglycemia, unspecified: Secondary | ICD-10-CM | POA: Diagnosis not present

## 2023-01-12 DIAGNOSIS — Z823 Family history of stroke: Secondary | ICD-10-CM

## 2023-01-12 DIAGNOSIS — Z6832 Body mass index (BMI) 32.0-32.9, adult: Secondary | ICD-10-CM

## 2023-01-12 DIAGNOSIS — Z7982 Long term (current) use of aspirin: Secondary | ICD-10-CM

## 2023-01-12 LAB — COMPREHENSIVE METABOLIC PANEL
ALT: 19 U/L (ref 0–44)
AST: 21 U/L (ref 15–41)
Albumin: 4.3 g/dL (ref 3.5–5.0)
Alkaline Phosphatase: 87 U/L (ref 38–126)
Anion gap: 8 (ref 5–15)
BUN: 34 mg/dL — ABNORMAL HIGH (ref 8–23)
CO2: 24 mmol/L (ref 22–32)
Calcium: 10.1 mg/dL (ref 8.9–10.3)
Chloride: 106 mmol/L (ref 98–111)
Creatinine, Ser: 1.31 mg/dL — ABNORMAL HIGH (ref 0.44–1.00)
GFR, Estimated: 43 mL/min — ABNORMAL LOW (ref >60)
Glucose, Bld: 240 mg/dL — ABNORMAL HIGH (ref 70–99)
Potassium: 5.2 mmol/L — ABNORMAL HIGH (ref 3.5–5.1)
Sodium: 138 mmol/L (ref 135–145)
Total Bilirubin: 0.4 mg/dL (ref 0.3–1.2)
Total Protein: 8.1 g/dL (ref 6.5–8.1)

## 2023-01-12 LAB — CBC WITH DIFFERENTIAL/PLATELET
Abs Immature Granulocytes: 0.04 10*3/uL (ref 0.00–0.07)
Basophils Absolute: 0 10*3/uL (ref 0.0–0.1)
Basophils Relative: 1 %
Eosinophils Absolute: 0 10*3/uL (ref 0.0–0.5)
Eosinophils Relative: 0 %
HCT: 38.4 % (ref 36.0–46.0)
Hemoglobin: 11 g/dL — ABNORMAL LOW (ref 12.0–15.0)
Immature Granulocytes: 1 %
Lymphocytes Relative: 13 %
Lymphs Abs: 0.8 10*3/uL (ref 0.7–4.0)
MCH: 21.9 pg — ABNORMAL LOW (ref 26.0–34.0)
MCHC: 28.6 g/dL — ABNORMAL LOW (ref 30.0–36.0)
MCV: 76.5 fL — ABNORMAL LOW (ref 80.0–100.0)
Monocytes Absolute: 0.2 10*3/uL (ref 0.1–1.0)
Monocytes Relative: 4 %
Neutro Abs: 5 10*3/uL (ref 1.7–7.7)
Neutrophils Relative %: 81 %
Platelets: 295 10*3/uL (ref 150–400)
RBC: 5.02 MIL/uL (ref 3.87–5.11)
RDW: 14.9 % (ref 11.5–15.5)
WBC: 6.2 10*3/uL (ref 4.0–10.5)
nRBC: 0 % (ref 0.0–0.2)

## 2023-01-12 LAB — GLUCOSE, CAPILLARY: Glucose-Capillary: 203 mg/dL — ABNORMAL HIGH (ref 70–99)

## 2023-01-12 LAB — BLOOD GAS, VENOUS
Acid-Base Excess: 1.1 mmol/L (ref 0.0–2.0)
Bicarbonate: 27.5 mmol/L (ref 20.0–28.0)
Drawn by: 4237
O2 Saturation: 75.9 %
Patient temperature: 36.4
pCO2, Ven: 50 mm[Hg] (ref 44–60)
pH, Ven: 7.35 (ref 7.25–7.43)
pO2, Ven: 41 mm[Hg] (ref 32–45)

## 2023-01-12 LAB — URINALYSIS, ROUTINE W REFLEX MICROSCOPIC
Bilirubin Urine: NEGATIVE
Glucose, UA: NEGATIVE mg/dL
Hgb urine dipstick: NEGATIVE
Ketones, ur: NEGATIVE mg/dL
Leukocytes,Ua: NEGATIVE
Nitrite: NEGATIVE
Protein, ur: NEGATIVE mg/dL
Specific Gravity, Urine: 1.015 (ref 1.005–1.030)
pH: 5 (ref 5.0–8.0)

## 2023-01-12 LAB — AMMONIA: Ammonia: 16 umol/L (ref 9–35)

## 2023-01-12 LAB — CBG MONITORING, ED: Glucose-Capillary: 234 mg/dL — ABNORMAL HIGH (ref 70–99)

## 2023-01-12 MED ORDER — INSULIN ASPART 100 UNIT/ML IJ SOLN
0.0000 [IU] | Freq: Three times a day (TID) | INTRAMUSCULAR | Status: DC
  Administered 2023-01-13 (×2): 3 [IU] via SUBCUTANEOUS
  Administered 2023-01-13 – 2023-01-14 (×2): 2 [IU] via SUBCUTANEOUS
  Administered 2023-01-14: 3 [IU] via SUBCUTANEOUS
  Administered 2023-01-14: 2 [IU] via SUBCUTANEOUS
  Administered 2023-01-15: 3 [IU] via SUBCUTANEOUS
  Administered 2023-01-15 – 2023-01-19 (×7): 2 [IU] via SUBCUTANEOUS
  Administered 2023-01-20: 3 [IU] via SUBCUTANEOUS
  Administered 2023-01-20: 1 [IU] via SUBCUTANEOUS
  Administered 2023-01-20 – 2023-01-21 (×2): 2 [IU] via SUBCUTANEOUS
  Administered 2023-01-22 (×2): 3 [IU] via SUBCUTANEOUS
  Administered 2023-01-22 – 2023-01-23 (×2): 2 [IU] via SUBCUTANEOUS
  Administered 2023-01-23: 3 [IU] via SUBCUTANEOUS
  Administered 2023-01-24: 2 [IU] via SUBCUTANEOUS
  Administered 2023-01-24: 3 [IU] via SUBCUTANEOUS
  Administered 2023-01-25: 2 [IU] via SUBCUTANEOUS
  Administered 2023-01-25: 5 [IU] via SUBCUTANEOUS
  Administered 2023-01-26 (×2): 3 [IU] via SUBCUTANEOUS
  Administered 2023-01-26: 2 [IU] via SUBCUTANEOUS

## 2023-01-12 MED ORDER — INSULIN ASPART 100 UNIT/ML IJ SOLN
0.0000 [IU] | Freq: Every day | INTRAMUSCULAR | Status: DC
  Administered 2023-01-12: 2 [IU] via SUBCUTANEOUS

## 2023-01-12 MED ORDER — IPRATROPIUM-ALBUTEROL 0.5-2.5 (3) MG/3ML IN SOLN: 3.0000 mL | Freq: Four times a day (QID) | RESPIRATORY_TRACT | Status: DC | PRN

## 2023-01-12 MED ORDER — LAMOTRIGINE 100 MG PO TABS
100.0000 mg | ORAL_TABLET | Freq: Two times a day (BID) | ORAL | Status: DC
  Administered 2023-01-12 – 2023-01-16 (×8): 100 mg via ORAL
  Filled 2023-01-12 (×8): qty 1

## 2023-01-12 MED ORDER — AMLODIPINE BESYLATE 5 MG PO TABS: 10.0000 mg | ORAL_TABLET | Freq: Every day | ORAL | Status: DC

## 2023-01-12 MED ORDER — ROSUVASTATIN CALCIUM 5 MG PO TABS
5.0000 mg | ORAL_TABLET | Freq: Every day | ORAL | Status: DC
  Administered 2023-01-12 – 2023-01-26 (×14): 5 mg via ORAL
  Filled 2023-01-12 (×15): qty 1

## 2023-01-12 MED ORDER — NICOTINE 21 MG/24HR TD PT24
21.0000 mg | MEDICATED_PATCH | Freq: Every day | TRANSDERMAL | Status: DC
  Filled 2023-01-12 (×2): qty 1

## 2023-01-12 MED ORDER — MONTELUKAST SODIUM 10 MG PO TABS
10.0000 mg | ORAL_TABLET | Freq: Every day | ORAL | Status: DC
  Administered 2023-01-13 – 2023-01-26 (×13): 10 mg via ORAL
  Filled 2023-01-12 (×15): qty 1

## 2023-01-12 MED ORDER — ONDANSETRON HCL 4 MG/2ML IJ SOLN
4.0000 mg | Freq: Four times a day (QID) | INTRAMUSCULAR | Status: DC | PRN
  Administered 2023-01-13 – 2023-01-24 (×7): 4 mg via INTRAVENOUS
  Filled 2023-01-12 (×7): qty 2

## 2023-01-12 MED ORDER — ENOXAPARIN SODIUM 40 MG/0.4ML IJ SOSY
40.0000 mg | PREFILLED_SYRINGE | INTRAMUSCULAR | Status: DC
  Administered 2023-01-13 – 2023-01-26 (×14): 40 mg via SUBCUTANEOUS
  Filled 2023-01-12 (×15): qty 0.4

## 2023-01-12 MED ORDER — MIRABEGRON ER 25 MG PO TB24
50.0000 mg | ORAL_TABLET | Freq: Every day | ORAL | Status: DC
  Administered 2023-01-13 – 2023-01-26 (×13): 50 mg via ORAL
  Filled 2023-01-12 (×16): qty 2

## 2023-01-12 MED ORDER — EZETIMIBE 10 MG PO TABS
10.0000 mg | ORAL_TABLET | Freq: Every day | ORAL | Status: DC
  Administered 2023-01-13 – 2023-01-26 (×13): 10 mg via ORAL
  Filled 2023-01-12 (×15): qty 1

## 2023-01-12 MED ORDER — ASPIRIN 81 MG PO TBEC
81.0000 mg | DELAYED_RELEASE_TABLET | Freq: Every day | ORAL | Status: DC
  Administered 2023-01-13 – 2023-01-27 (×15): 81 mg via ORAL
  Filled 2023-01-12 (×15): qty 1

## 2023-01-12 MED ORDER — PREGABALIN 75 MG PO CAPS: 75.0000 mg | ORAL_CAPSULE | Freq: Every day | ORAL | Status: DC

## 2023-01-12 MED ORDER — INSULIN GLARGINE-YFGN 100 UNIT/ML ~~LOC~~ SOLN
10.0000 [IU] | Freq: Every day | SUBCUTANEOUS | Status: DC
Start: 2023-01-13 — End: 2023-01-27
  Administered 2023-01-13 – 2023-01-26 (×13): 10 [IU] via SUBCUTANEOUS
  Filled 2023-01-12 (×16): qty 0.1

## 2023-01-12 MED ORDER — SODIUM CHLORIDE 0.9 % IV SOLN: INTRAVENOUS | Status: AC

## 2023-01-12 MED ORDER — CARVEDILOL 12.5 MG PO TABS
12.5000 mg | ORAL_TABLET | Freq: Two times a day (BID) | ORAL | Status: DC
  Administered 2023-01-13 – 2023-01-27 (×28): 12.5 mg via ORAL
  Filled 2023-01-12 (×29): qty 1

## 2023-01-12 MED ORDER — INSULIN GLARGINE-YFGN 100 UNIT/ML ~~LOC~~ SOLN: 10.0000 [IU] | Freq: Every day | SUBCUTANEOUS | Status: DC

## 2023-01-12 MED ORDER — ACETAMINOPHEN 325 MG PO TABS
650.0000 mg | ORAL_TABLET | Freq: Four times a day (QID) | ORAL | Status: DC | PRN
  Administered 2023-01-16 – 2023-01-25 (×8): 650 mg via ORAL
  Filled 2023-01-12 (×7): qty 2

## 2023-01-12 NOTE — ED Triage Notes (Signed)
EMS reports pt has private aids at her home that reported pt has had urinary frequency for the past several days and is not acting like herself.  EMS also noted the patient has a daily med dispense box that has different pills in every day and they could not find the pill bottles.  Aids in the home say they do not do medications only assist her with ADL's.

## 2023-01-12 NOTE — ED Provider Notes (Signed)
[place] [facility] AT [facility] Provider Note   CSN: [id number] Arrival date & time: 01/12/23  1627     History  Chief Complaint  Patient presents with   Urinary Frequency   Altered Mental Status    [name] is a 72 y.o. female with past medical history of diabetes, IDA, CKD, CHF, nicotine dependence, COPD presents via EMS to emergency department for evaluation of increased urinary frequency and somnolence. EMS reports that patient has private aide at home with her that seeing her throughout the day.  Aides told EMS that she has not been "as feisty" as normal and has had increased urinary frequency with accidents in her diaper.  They report that she does not normally have accidents in her diaper.   EMS noted that there were different pills in different days there is no consistency to pills in pill container.  Patient is somnolent and is roused easily with verbal stimuli. However, while talking she continues to try to go to sleep and occasionally need to be roused again. She has no complaints of urinary symptoms, chest pain, or SOB.   Urinary Frequency Pertinent negatives include no chest pain, no abdominal pain, no headaches and no shortness of breath.  Altered Mental Status Associated symptoms: no abdominal pain, no fever, no headaches, no light-headedness, no nausea, no palpitations, no seizures, no vomiting and no weakness        Home Medications Prior to Admission medications   Medication Sig Start Date End Date Taking? Authorizing Provider  Accu-Chek Softclix Lancets lancets TEST BLOOD SUGAR THREE TIMES DAILY AS DIRECTED 03/28/21   Carlus Pavlov, MD  acetaminophen (TYLENOL) 325 MG tablet Take 650 mg by mouth every 6 (six) hours as needed for mild pain, moderate pain or headache.    [provider]  Alcohol Swabs (DROPSAFE ALCOHOL PREP) 70 % PADS USE TO CLEAN FINGER PRIOR TO TESTING FOR BLOOD SUGAR AS DIRECTED 05/10/22   Carlus Pavlov, MD  alendronate (FOSAMAX) 70 MG tablet TAKE 1 TABLET EVERY 7 DAYS ON AN EMPTY STOMACH WITH A FULL GLASS OF WATER Patient taking differently: Take 70 mg by mouth once a week. 11/07/21   Kerri Perches, MD  amLODipine (NORVASC) 10 MG tablet TAKE 1 TABLET EVERY DAY (NEED MD APPOINTMENT) 11/28/22   Quintella Reichert, MD  aspirin EC 81 MG tablet Take 1 tablet (81 mg total) by mouth daily with breakfast. 12/08/18   Shon Hale, MD  Blood Glucose Calibration (ACCU-CHEK GUIDE CONTROL) LIQD USE AS DIRECTED 12/13/20   Carlus Pavlov, MD  blood glucose meter kit and supplies Dispense based on patient and insurance preference. Use up to four times daily as directed. (FOR ICD-10 E10.9, E11.9). 06/27/19   Carlus Pavlov, MD  buPROPion (WELLBUTRIN XL) 150 MG 24 hr tablet Take 1 tablet (150 mg total) by mouth every morning. 10/04/22 10/04/23  Myrlene Broker, MD  carvedilol (COREG) 12.5 MG tablet Take 12.5 mg by mouth 2 (two) times daily with a meal.    [provider]  Cholecalciferol (D3 PO) Take 1 tablet by mouth daily.    [provider]  Continuous Blood Gluc Sensor (FREESTYLE LIBRE 14 DAY SENSOR) MISC 1 each by Does not apply route every 14 (fourteen) days. Change every 2 weeks 06/24/19   Carlus Pavlov, MD  Docusate Sodium (DSS) 100 MG CAPS Take 100 mg by mouth in the morning and at bedtime.    [provider]  DROPLET PEN NEEDLES  31G X 8 MM MISC USE FOR INJECTING INSULIN 4 TIMES DAILY. 09/02/21   Carlus Pavlov, MD  Elastic Bandages & Supports (ADJUSTABLE ARM SLING) MISC L arm sling 05/04/22   Kerri Perches, MD  ezetimibe (ZETIA) 10 MG tablet TAKE 1 TABLET EVERY DAY 01/19/22   Kerri Perches, MD  glucose blood (ACCU-CHEK GUIDE) test strip Use as instructed 12/12/22   Carlus Pavlov, MD  hydrochlorothiazide (MICROZIDE) 12.5 MG capsule Take 12.5 mg by mouth daily. 09/27/22   [provider]  HYDROcodone-acetaminophen (NORCO/VICODIN) 5-325 MG  tablet Take 1 tablet 3 times a day by oral route as needed for 5 days. 12/14/22   [provider]  insulin aspart (NOVOLOG FLEXPEN) 100 UNIT/ML FlexPen Inject 5-8 Units into the skin 3 (three) times daily with meals. 11/03/22   Carlus Pavlov, MD  insulin glargine, 2 Unit Dial, (TOUJEO MAX SOLOSTAR) 300 UNIT/ML Solostar Pen Inject 20-26 Units into the skin at bedtime. Patient taking differently: Inject 23 Units into the skin at bedtime. 10/03/22   Carlus Pavlov, MD  ipratropium-albuterol (DUONEB) 0.5-2.5 (3) MG/3ML SOLN Take 3 mLs by nebulization every 6 (six) hours as needed. 12/08/22   Waymon Budge, MD  ketoconazole (NIZORAL) 2 % shampoo Apply 1 Application topically 2 (two) times a week. 10/16/22   Kerri Perches, MD  lamoTRIgine (LAMICTAL) 100 MG tablet Take 1 tablet (100 mg total) by mouth 2 (two) times daily. 01/04/23   Myrlene Broker, MD  levothyroxine (SYNTHROID) 50 MCG tablet TAKE 1 TABLET DAILY SIX DAYS A WEEK AND 1/2 TABLET ON ONE DAY A WEEK Patient taking differently: Take 50 mcg by mouth See admin instructions. Take 50 mg by mouth for 6 days a week and 25 mg on one day a week 11/15/22   Carlus Pavlov, MD  LORazepam (ATIVAN) 0.5 MG tablet Take 1 tablet (0.5 mg total) by mouth 2 (two) times daily. 01/04/23   Myrlene Broker, MD  metoprolol tartrate (LOPRESSOR) 50 MG tablet TAKE 1 TABLET TWICE DAILY (NEED MD APPOINTMENT) Patient taking differently: Take 50 mg by mouth 2 (two) times daily. TAKE 1 TABLET TWICE DAILY (NEED MD APPOINTMENT) 10/24/22   Quintella Reichert, MD  mirabegron ER (MYRBETRIQ) 50 MG TB24 tablet Take 1 tablet (50 mg total) by mouth daily. 12/18/22   Donnita Falls, FNP  Misc. Devices (MATTRESS PAD) MISC FIRM MATTRESS PAD for hospital bed x 1 02/22/21   Kerri Perches, MD  montelukast (SINGULAIR) 10 MG tablet Take 10 mg by mouth daily. 10/27/22   [provider]  Multiple Vitamins-Minerals (MULTIVITAMIN GUMMIES ADULT PO) Take 1 tablet by mouth  daily.    [provider]  nicotine (NICODERM CQ) 21 mg/24hr patch Place 1 patch (21 mg total) onto the skin daily. 06/20/22   Waymon Budge, MD  ondansetron (ZOFRAN) 4 MG tablet Take one tablet by mouth once daily, as needed , for nausea 12/12/22   Kerri Perches, MD  potassium chloride (KLOR-CON) 10 MEQ tablet Take 1 tablet (10 mEq total) by mouth 2 (two) times daily. 02/01/22   Kerri Perches, MD  pregabalin (LYRICA) 75 MG capsule Take 75 mg by mouth daily.    [provider]  RABEprazole (ACIPHEX) 20 MG tablet TAKE 1 TABLET TWICE DAILY 12/28/22   Pyrtle, Carie Caddy, MD  RESTASIS 0.05 % ophthalmic emulsion Place 2 drops into both eyes daily. 07/29/20   [provider]  risperiDONE (RISPERDAL) 0.5 MG tablet Take 1 tablet (0.5  mg total) by mouth at bedtime. 01/04/23   Myrlene Broker, MD  rosuvastatin (CRESTOR) 5 MG tablet TAKE 1 TABLET AT BEDTIME 06/12/22   Kerri Perches, MD  sertraline (ZOLOFT) 100 MG tablet Take 1 tablet (100 mg total) by mouth every morning. 01/04/23   Myrlene Broker, MD  spironolactone (ALDACTONE) 50 MG tablet TAKE 1 TABLET (50 MG TOTAL) BY MOUTH DAILY. DISCONTINUE SPIRONOLACTONE 25MG  Patient taking differently: Take 50 mg by mouth daily. Discontinue Spironolactone 25 mg 04/18/22   Kerri Perches, MD  telmisartan (MICARDIS) 20 MG tablet Take 10 mg by mouth daily. 11/23/21   [provider]  traZODone (DESYREL) 150 MG tablet Take 1 tablet (150 mg total) by mouth at bedtime. 01/04/23   Myrlene Broker, MD  UNABLE TO FIND Med Name:  G5 Mattress 07/31/22   Kerri Perches, MD  vitamin E 180 MG (400 UNITS) capsule Take 400 Units by mouth daily.    [provider]      Allergies    Iron, Milk (cow), Penicillins, Phenazopyridine, Cephalexin, Flonase [fluticasone], and Milk-related compounds    Review of Systems   Review of Systems  Constitutional:  Positive for fatigue. Negative for chills and fever.  Respiratory:   Negative for cough, chest tightness, shortness of breath and wheezing.   Cardiovascular:  Negative for chest pain and palpitations.  Gastrointestinal:  Negative for abdominal pain, constipation, diarrhea, nausea and vomiting.  Genitourinary:  Positive for frequency.  Neurological:  Negative for dizziness, seizures, weakness, light-headedness, numbness and headaches.    Physical Exam Updated Vital Signs BP (!) 119/93   Pulse 78   Temp (!) 97.5 F (36.4 C) (Oral)   Resp 20   Ht 4\' 11"  (1.499 m)   Wt 73.5 kg   SpO2 97%   BMI 32.73 kg/m  Physical Exam Vitals and nursing note reviewed.  Constitutional:      General: She is not in acute distress.    Appearance: Normal appearance.  HENT:     Head: Normocephalic and atraumatic.  Eyes:     Conjunctiva/sclera: Conjunctivae normal.  Cardiovascular:     Rate and Rhythm: Normal rate.  Pulmonary:     Effort: Pulmonary effort is normal. No respiratory distress.  Abdominal:     General: Bowel sounds are normal. There is no distension.     Palpations: Abdomen is soft.     Tenderness: There is no abdominal tenderness.  Skin:    Coloration: Skin is not jaundiced or pale.  Neurological:     Mental Status: She is alert. Mental status is at baseline.     Motor: Weakness (LLE (per baseline from previous stroke)) present.     Comments: A&Ox2 with confusion to current year     ED Results / Procedures / Treatments   Labs (all labs ordered are listed, but only abnormal results are displayed) Labs Reviewed  CBC WITH DIFFERENTIAL/PLATELET - Abnormal; Notable for the following components:      Result Value   Hemoglobin 11.0 (*)    MCV 76.5 (*)    MCH 21.9 (*)    MCHC 28.6 (*)    All other components within normal limits  COMPREHENSIVE METABOLIC PANEL - Abnormal; Notable for the following components:   Potassium 5.2 (*)    Glucose, Bld 240 (*)    BUN 34 (*)    Creatinine, Ser 1.31 (*)    GFR, Estimated 43 (*)    All other components  within normal limits  CBG MONITORING, ED - Abnormal; Notable for the following components:   Glucose-Capillary 234 (*)    All other components within normal limits  URINE CULTURE  URINALYSIS, ROUTINE W REFLEX MICROSCOPIC  AMMONIA  BLOOD GAS, VENOUS    EKG None  Radiology DG Chest Portable 1 View  Result Date: 01/12/2023 CLINICAL DATA:  Altered mental status EXAM: PORTABLE CHEST 1 VIEW COMPARISON:  11/22/2022 FINDINGS: Heart size within normal limits. Low lung volumes. Mild linear right basilar atelectasis. No pleural effusion or pneumothorax. IMPRESSION: Low lung volumes with mild linear right basilar atelectasis. Electronically Signed   By: Duanne Guess D.O.   On: 01/12/2023 19:31   CT Head Wo Contrast  Result Date: 01/12/2023 CLINICAL DATA:  Head and neck trauma EXAM: CT HEAD WITHOUT CONTRAST CT CERVICAL SPINE WITHOUT CONTRAST TECHNIQUE: Multidetector CT imaging of the head and cervical spine was performed following the standard protocol without intravenous contrast. Multiplanar CT image reconstructions of the cervical spine were also generated. RADIATION DOSE REDUCTION: This exam was performed according to the departmental dose-optimization program which includes automated exposure control, adjustment of the mA and/or kV according to patient size and/or use of iterative reconstruction technique. COMPARISON:  11/22/2022 CT head, 10/26/2022 CT head and cervical spine FINDINGS: CT HEAD FINDINGS Brain: No evidence of acute infarct, hemorrhage, mass, mass effect, or midline shift. No hydrocephalus or extra-axial fluid collection. Redemonstrated encephalomalacia in the bilateral inferior frontal lobes, status post prior planum sphenoidal meningioma resection, with residual meningioma not well seen on CT. Surgical clips in the anterior cranial fossa. Vascular: No hyperdense vessel. Atherosclerotic calcifications in the intracranial carotid and vertebral arteries. Skull: Negative for fracture or  focal lesion. Prior bifrontal craniotomy. Sinuses/Orbits: No acute finding. Other: The mastoid air cells are well aerated. CT CERVICAL SPINE FINDINGS Alignment: No traumatic listhesis. Skull base and vertebrae: No acute fracture or suspicious osseous lesion. Soft tissues and spinal canal: No prevertebral fluid or swelling. No visible canal hematoma. Partially calcified nodule in the right lobe and isthmus or most recently evaluated with ultrasound on 02/02/2021. Disc levels: Degenerative changes in the cervical spine.No high-grade spinal canal stenosis. Upper chest: No focal pulmonary opacity or pleural effusion. IMPRESSION: 1. No acute intracranial process. 2. No acute fracture or traumatic listhesis in the cervical spine. 3. Redemonstrated encephalomalacia in the bilateral inferior frontal lobes, status post prior planum sphenoidal meningioma resection. Electronically Signed   By: Wiliam Ke M.D.   On: 01/12/2023 19:14   CT Cervical Spine Wo Contrast  Result Date: 01/12/2023 CLINICAL DATA:  Head and neck trauma EXAM: CT HEAD WITHOUT CONTRAST CT CERVICAL SPINE WITHOUT CONTRAST TECHNIQUE: Multidetector CT imaging of the head and cervical spine was performed following the standard protocol without intravenous contrast. Multiplanar CT image reconstructions of the cervical spine were also generated. RADIATION DOSE REDUCTION: This exam was performed according to the departmental dose-optimization program which includes automated exposure control, adjustment of the mA and/or kV according to patient size and/or use of iterative reconstruction technique. COMPARISON:  11/22/2022 CT head, 10/26/2022 CT head and cervical spine FINDINGS: CT HEAD FINDINGS Brain: No evidence of acute infarct, hemorrhage, mass, mass effect, or midline shift. No hydrocephalus or extra-axial fluid collection. Redemonstrated encephalomalacia in the bilateral inferior frontal lobes, status post prior planum sphenoidal meningioma resection,  with residual meningioma not well seen on CT. Surgical clips in the anterior cranial fossa. Vascular: No hyperdense vessel. Atherosclerotic calcifications in the intracranial carotid and vertebral arteries. Skull: Negative for fracture or focal lesion. Prior bifrontal  craniotomy. Sinuses/Orbits: No acute finding. Other: The mastoid air cells are well aerated. CT CERVICAL SPINE FINDINGS Alignment: No traumatic listhesis. Skull base and vertebrae: No acute fracture or suspicious osseous lesion. Soft tissues and spinal canal: No prevertebral fluid or swelling. No visible canal hematoma. Partially calcified nodule in the right lobe and isthmus or most recently evaluated with ultrasound on 02/02/2021. Disc levels: Degenerative changes in the cervical spine.No high-grade spinal canal stenosis. Upper chest: No focal pulmonary opacity or pleural effusion. IMPRESSION: 1. No acute intracranial process. 2. No acute fracture or traumatic listhesis in the cervical spine. 3. Redemonstrated encephalomalacia in the bilateral inferior frontal lobes, status post prior planum sphenoidal meningioma resection. Electronically Signed   By: Wiliam Ke M.D.   On: 01/12/2023 19:14    Procedures Procedures    Medications Ordered in ED Medications - No data to display  ED Course/ Medical Decision Making/ A&P                                 Medical Decision Making Amount and/or Complexity of Data Reviewed Labs: ordered. Radiology: ordered.    Patient presents to the ED for concern of increased somnolence and urinary frequency, this involves an extensive number of treatment options, and is a complaint that carries with it a high risk of complications and morbidity.  The differential diagnosis includes polypharmacy, electrolyte abnormality, UTI, hypercarbia secondary to COPD and OSA.  Is not an exhaustive list   Co morbidities that complicate the patient evaluation  Multiple medical conditions requiring multiple  medications   Additional history obtained:  Additional history obtained from EMS, Family, Nursing, Outside Medical Records, and Past Admission  Attempted to get in contact with home health aides however was unsuccessful. Audria Nine (son) is not fully up-to-date with patient condition.  He reports concern regarding being home and on her own as she is not has difficulty with ambulation.  Coolidge Breeze (niece/POA) regarding recent patient condition.  She was informed about a unwitnessed fall 2 days ago 01/10/2023 by a private home health aides.   She visited with patient yesterday and reported that patient was increasingly sleepy She reports that patient has had declining urinary continence and increased difficulty maintaining her private living at home with aides Medications used to be prefilled but recently patient has been managing her medications She believes patient should be in assisted living facility based on recent decline in June 2024  External records from outside source obtained and reviewed  ED note from 11/22/2022 reveals patient in similar condition of somnolence and requiring verbal stimulation to wake patient up to answer questions.  Patient was discharged with reassuring physical exam   Lab Tests:  I Ordered, and personally interpreted labs.  The pertinent results include:  AKI (1.35) mildly elevated from baseline and hyperglycemia (234) without DKA   Imaging Studies ordered:  I ordered imaging studies including CT head and cervical spine  I independently visualized and interpreted imaging which showed no ICH, fracture, intracranial abnormality I agree with the radiologist interpretation   Cardiac Monitoring:  The patient was maintained on a cardiac monitor.  I personally viewed and interpreted the cardiac monitored which showed an underlying rhythm of: NSR with no ST or ischemia    Consultations Obtained:  I requested consultation with the hospitalist Thomes Dinning,  [place] DO),  and discussed lab and imaging findings as well as pertinent plan - they recommend: Obtaining urine culture  for increased urinary frequency.  He accepts patient for admission due to mildly elevated creatinine, possible UTI based on symptoms, initial AMS   Problem List / ED Course:  Daytime somnolence Urinary frequency   Reevaluation:  After the interventions noted above, I reevaluated the patient and found that they have :improved   Social Determinants of Health:  Patient lives at home alone with occasional aides who help with ADLs Patient manages medications on her own: Concern for polypharmacy as this is the second time patient has experienced somnolence and ED evaluation with mostly benign workup Patient has increased difficulty with ambulation and ADLs on her own and with aids   Dispostion:  Sleeping comfortably in bed.  She is arousable with verbal stimuli.  No tachycardia, hypoxia, fever noted.  She is more dynamically stable.  See HPI.  Will obtain lab work to rule out electrolyte abnormality, hyperammonia, urinary infection, hypo-/hyper glycemia, hypercarbia.  As patient had a fall 2 days ago will obtain CT head to rule out ICH or new intracranial abnormality to explain symptoms.  EKG is NSR with no ST or ischemia noted.  Upon phone conversation with patient's niece and POA, it appears that some of these complaints are chronic in nature and have been going on.  She was seen in ED in September for similar complaints with normal workup.  However, home health aide to see patient daily, report that she has increased urinary urgency and is recently occasionally incontinent over the past 2 days.  Unsure whether this frequency and incontinence is due to UTI versus polypharmacy versus inability to ambulate to bathroom  Upon reevaluation, patient states "I am ready to go home" and mildly agitated that she is still in ED. She is no longer somnolent and attempting to get up  from bed. However, she is unable to. She is fully alert and oriented.  After consideration of the diagnostic results and the patients response to treatment, I feel that the patent would benefit from admission for possible UTI due to increased urinary incontinence and frequency over past 2 days according to home health aides.  I have concern regarding discharging patient as patient lives alone and has difficulty with ambulation.  Patient's daughter reports that home health aides are not always consistent and do not help her with medical tasks such as managing her medications and ensuring that she takes them appropriately.  I consulted with hospitalist who recommended obtaining urine culture for possible treatment of UTI based on symptoms.  He accepts patient for admission        Final Clinical Impression(s) / ED Diagnoses Final diagnoses:  Daytime somnolence  Urinary frequency    Rx / DC Orders ED Discharge Orders     None         Judithann Sheen, PA 01/12/23 2143    Coral Spikes, DO 01/13/23 0117

## 2023-01-12 NOTE — H&P (Addendum)
History and Physical    Patient: [name] XBJ:[id number] DOB: [birth year] DOA: 01/12/2023 DOS: the patient was seen and examined on 01/12/2023 PCP: Kerri Perches, MD  Patient coming from: Home  Chief Complaint:  Chief Complaint  Patient presents with   Urinary Frequency   Altered Mental Status   HPI: [name] is a 72 y.o. female with medical history significant of hypertension, hyperlipidemia, GERD, COPD, hypothyroidism, seizures, paralysis, stroke who presents to the emergency department due to altered mental status.  Patient was unable to provide a history possibly due to current altered mental status, history was obtained from ED PA and medical record.  Per report, patient has nurse aides at her home and she was reported to have had increased urinary frequency since past several days and has not been acting like herself.  Patient was also noted to have different pills for different days in her pill dispenser, it is unknown what [facility]'s patient's medication as the nurse aide states that patient was only assisted with ADLs without RN to arrange for medications. Regarding the urinary frequency, daughter (by phone) endorsed that patient has had incontinence since June of this year and usually wets her diapers per ED PA. At baseline, patient ambulates with cane and walker No report of fever, chills, headache, chest pain, shortness of breath, nausea or vomiting.  ED Course:  In the emergency department, temperature was 90 7.30F, respiratory rate 22, BP 162/63, other vital signs are within normal range.  Workup in the ED showed microcytic anemia, BMP was positive for potassium 5.2, blood glucose 240, BUN/creatinine 34/1.31 (baseline creatinine at 0.9-1.0), EGFR 43, ammonia 16, urinalysis was normal. CT head showed no acute intracranial process. CT cervical spine showed no acute fracture or traumatic listhesis in the cervical spine Chest x-ray showed low lung volumes with mild to linear  right basilar atelectasis Hospitalist was asked to admit patient for further evaluation and management.  Review of Systems: Review of systems as noted in the HPI. All other systems reviewed and are negative.   Past Medical History:  Diagnosis Date   Allergy    Anemia    Anemia in chronic kidney disease (CKD) 08/10/2021   Anemia in chronic kidney disease (CKD) 10/12/2021   Anxiety    takes Ativan daily   Arthritis    Assistance needed for mobility    Bipolar disorder (HCC)    takes Risperdal nightly   Blood transfusion    Brain tumor (HCC)    Cancer (HCC)    In her gum   Carpal tunnel syndrome of right wrist 05/23/2011   Cervical disc disorder with radiculopathy of cervical region 10/31/2012   Chronic back pain    Chronic idiopathic constipation    Chronic neck and back pain    Colon polyps    COPD (chronic obstructive pulmonary disease) with chronic bronchitis (HCC) 09/16/2013   Office Spirometry 10/30/2013-submaximal effort based on appearance of loop and curve. Numbers would fit with severe restriction but her physiologic capability may be better than this. FVC 0.91/44%, and 10.74/45%, FEV1/FVC 0.81, FEF 25-75% 1.43/69%     Diabetes mellitus    Type II   Diverticulosis    TCS 9/08 by Dr. Lina Sar for diarrhea . Bx for micro scopic colitis negative.    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   Metabolic encephalopathy 08/03/2011   Migraines    chronic headaches   Mononeuritis lower limb     Narcolepsy    Osteoporosis    Pancreatitis 2006   due to Depakote with normal EUS    Paralysis (HCC)    Pneumonia    Schatzki's ring    non critical / EGD with ED 8/2011with RMR   Seizures (HCC)    takes Lamictal daily.Last seizure 3 yrs ago   Sleep apnea    on CPAP   Small bowel obstruction (HCC)    Stroke (HCC)    left sided weakness, speech changes   Tubular adenoma of colon    Past Surgical History:  Procedure Laterality Date   ABDOMINAL HYSTERECTOMY  1978   BACK SURGERY  July 2012   BACTERIAL OVERGROWTH TEST N/A 05/05/2013   Procedure: BACTERIAL OVERGROWTH TEST;  Surgeon: Corbin Ade, MD;  Location: [facility];  Service: Endoscopy;  Laterality: N/A;  7:30   BIOPSY THYROID  2009   BRAIN SURGERY  11/2011   resection of meningioma   BREAST REDUCTION SURGERY  1994   CARDIAC CATHETERIZATION  05/10/2005   normal coronaries, normal LV systolic function and EF (Dr. Evlyn Courier)   CARPAL TUNNEL RELEASE Left 07/22/04   Dr. Romeo Apple   CATARACT EXTRACTION Bilateral    CHOLECYSTECTOMY  1984   COLONOSCOPY N/A 09/25/2012   [id number] diverticulosis.  colonic polyp-removed : tubular adenoma   CRANIOTOMY  11/23/2011   Procedure: CRANIOTOMY TUMOR EXCISION;  Surgeon: Hewitt Shorts, MD;  Location: [facility];  Service: Neurosurgery;  Laterality: N/A;  Craniotomy for tumor resection   ESOPHAGOGASTRODUODENOSCOPY  12/29/2010   Rourk-Retained food in the esophagus and stomach, small hiatal hernia, status post Maloney dilation of the esophagus   ESOPHAGOGASTRODUODENOSCOPY N/A 09/25/2012   NFA:[id number] atonic baggy esophagus status post Maloney dilation 56 F. Hiatal hernia   GIVENS CAPSULE STUDY N/A 01/15/2013   NORMAL.    IR GENERIC HISTORICAL  03/17/2016   IR RADIOLOGIST EVAL & MGMT 03/17/2016 [facility]   LESION REMOVAL N/A 05/31/2015   Procedure: REMOVAL RIGHT AND LEFT LESIONS OF MANDIBLE;  Surgeon: Ocie Doyne, DDS;  Location: [facility];  Service: Oral Surgery;  Laterality: N/A;    MALONEY DILATION  12/29/2010   RMR;   NM MYOCAR PERF WALL MOTION  2006   "relavtiely normal" persantine, mild anterior thinning (breast attenuation artifact), no region of scar/ischemia   OVARIAN CYST REMOVAL     RECTOCELE REPAIR N/A 06/29/2015   Procedure: POSTERIOR REPAIR (RECTOCELE);  Surgeon: Tilda Burrow, MD;  Location: [facility];  Service: Gynecology;  Laterality: N/A;   REDUCTION MAMMAPLASTY Bilateral    REVERSE SHOULDER ARTHROPLASTY Left 08/18/2022   Procedure: REVERSE SHOULDER ARTHROPLASTY;  Surgeon: Beverely Low, MD;  Location: [facility];  Service: Orthopedics;  Laterality: Left;  choice with interscalene, general   SPINE SURGERY  09/29/2010   Dr. Shon Baton   surgical excision of 3 tumors from right thigh and right buttock  and left upper thigh  2010   TOOTH EXTRACTION  Bilateral 12/14/2014   Procedure: REMOVAL OF BILATERAL MANDIBULAR EXOSTOSES;  Surgeon: Ocie Doyne, DDS;  Location: [facility];  Service: Oral Surgery;  Laterality: Bilateral;   TRANSTHORACIC ECHOCARDIOGRAM  2010   EF 60-65%, mild conc LVH, grade 1 diastolic dysfunction; mildly calcified MV annulus with mildly thickened leaflets, mildly calcified MR annulus    Social History:  reports that she quit smoking about 18 months ago. Her smoking use included cigarettes. She started smoking about 48 years ago. She has a 23.5 pack-year smoking history. She has been exposed to tobacco smoke. She has never used smokeless tobacco. She reports that she does not drink alcohol and does not use drugs.   Allergies  Allergen Reactions   Iron Itching and Nausea And Vomiting   Milk (Cow) Rash and Other (See Comments)    Doesn't agree with the stomach, also    Penicillins Hives   Phenazopyridine Hives   Cephalexin Hives   Flonase [Fluticasone] Other (See Comments)    "It gave me ulcers in my nose"   Milk-Related Compounds Nausea Only and Other (See Comments)    Doesn't agree with the stomach    Family History  Problem Relation Age of  Onset   Heart attack Mother        HTN   Pneumonia Father    Kidney failure Father    Diabetes Father    Pancreatic cancer Sister    Cancer Sister        breast    Cancer Sister        pancreatic   Diabetes Brother    Hypertension Brother    Diabetes Brother    Alcohol abuse Maternal Uncle    Stroke Maternal Grandmother    Heart attack Maternal Grandfather    Hypertension Son    Sleep apnea Son    Colon cancer Neg Hx    Anesthesia problems Neg Hx    Hypotension Neg Hx    Malignant hyperthermia Neg Hx    Pseudochol deficiency Neg Hx    Breast cancer Neg Hx    Stomach cancer Neg Hx      Prior to Admission medications   Medication Sig Start Date End Date Taking? Authorizing Provider  Accu-Chek Softclix Lancets lancets TEST BLOOD SUGAR THREE TIMES DAILY AS DIRECTED 03/28/21   Carlus Pavlov, MD  acetaminophen (TYLENOL) 325 MG tablet Take 650 mg by mouth every 6 (six) hours as needed for mild pain, moderate pain or headache.    [provider]  Alcohol Swabs (DROPSAFE ALCOHOL PREP) 70 % PADS USE TO CLEAN FINGER PRIOR TO TESTING FOR BLOOD SUGAR AS DIRECTED 05/10/22   Carlus Pavlov, MD  alendronate (FOSAMAX) 70 MG tablet TAKE 1 TABLET EVERY 7 DAYS ON AN EMPTY STOMACH WITH A FULL GLASS OF WATER Patient taking differently: Take 70 mg by mouth once a week. 11/07/21   Kerri Perches, MD  amLODipine (NORVASC) 10 MG tablet TAKE 1 TABLET EVERY DAY (NEED MD APPOINTMENT) 11/28/22   Quintella Reichert, MD  aspirin EC 81 MG tablet Take 1 tablet (81 mg total) by mouth daily with breakfast. 12/08/18   Shon Hale, MD  Blood Glucose Calibration (ACCU-CHEK GUIDE CONTROL) LIQD USE AS DIRECTED 12/13/20   Carlus Pavlov, MD  blood glucose meter kit and supplies Dispense based on patient and insurance preference. Use up to four times daily as directed. (FOR ICD-10 E10.9, E11.9). 06/27/19   Carlus Pavlov, MD  buPROPion (WELLBUTRIN XL) 150 MG 24 hr tablet Take 1  tablet (150 mg  total) by mouth every morning. 10/04/22 10/04/23  Myrlene Broker, MD  carvedilol (COREG) 12.5 MG tablet Take 12.5 mg by mouth 2 (two) times daily with a meal.    [provider]  Cholecalciferol (D3 PO) Take 1 tablet by mouth daily.    [provider]  Continuous Blood Gluc Sensor (FREESTYLE LIBRE 14 DAY SENSOR) MISC 1 each by Does not apply route every 14 (fourteen) days. Change every 2 weeks 06/24/19   Carlus Pavlov, MD  Docusate Sodium (DSS) 100 MG CAPS Take 100 mg by mouth in the morning and at bedtime.    [provider]  DROPLET PEN NEEDLES 31G X 8 MM MISC USE FOR INJECTING INSULIN 4 TIMES DAILY. 09/02/21   Carlus Pavlov, MD  Elastic Bandages & Supports (ADJUSTABLE ARM SLING) MISC L arm sling 05/04/22   Kerri Perches, MD  ezetimibe (ZETIA) 10 MG tablet TAKE 1 TABLET EVERY DAY 01/19/22   Kerri Perches, MD  glucose blood (ACCU-CHEK GUIDE) test strip Use as instructed 12/12/22   Carlus Pavlov, MD  hydrochlorothiazide (MICROZIDE) 12.5 MG capsule Take 12.5 mg by mouth daily. 09/27/22   [provider]  HYDROcodone-acetaminophen (NORCO/VICODIN) 5-325 MG tablet Take 1 tablet 3 times a day by oral route as needed for 5 days. 12/14/22   [provider]  insulin aspart (NOVOLOG FLEXPEN) 100 UNIT/ML FlexPen Inject 5-8 Units into the skin 3 (three) times daily with meals. 11/03/22   Carlus Pavlov, MD  insulin glargine, 2 Unit Dial, (TOUJEO MAX SOLOSTAR) 300 UNIT/ML Solostar Pen Inject 20-26 Units into the skin at bedtime. Patient taking differently: Inject 23 Units into the skin at bedtime. 10/03/22   Carlus Pavlov, MD  ipratropium-albuterol (DUONEB) 0.5-2.5 (3) MG/3ML SOLN Take 3 mLs by nebulization every 6 (six) hours as needed. 12/08/22   Waymon Budge, MD  ketoconazole (NIZORAL) 2 % shampoo Apply 1 Application topically 2 (two) times a week. 10/16/22   Kerri Perches, MD  lamoTRIgine (LAMICTAL) 100 MG tablet Take 1 tablet (100 mg  total) by mouth 2 (two) times daily. 01/04/23   Myrlene Broker, MD  levothyroxine (SYNTHROID) 50 MCG tablet TAKE 1 TABLET DAILY SIX DAYS A WEEK AND 1/2 TABLET ON ONE DAY A WEEK Patient taking differently: Take 50 mcg by mouth See admin instructions. Take 50 mg by mouth for 6 days a week and 25 mg on one day a week 11/15/22   Carlus Pavlov, MD  LORazepam (ATIVAN) 0.5 MG tablet Take 1 tablet (0.5 mg total) by mouth 2 (two) times daily. 01/04/23   Myrlene Broker, MD  metoprolol tartrate (LOPRESSOR) 50 MG tablet TAKE 1 TABLET TWICE DAILY (NEED MD APPOINTMENT) Patient taking differently: Take 50 mg by mouth 2 (two) times daily. TAKE 1 TABLET TWICE DAILY (NEED MD APPOINTMENT) 10/24/22   Quintella Reichert, MD  mirabegron ER (MYRBETRIQ) 50 MG TB24 tablet Take 1 tablet (50 mg total) by mouth daily. 12/18/22   Donnita Falls, FNP  Misc. Devices (MATTRESS PAD) MISC FIRM MATTRESS PAD for hospital bed x 1 02/22/21   Kerri Perches, MD  montelukast (SINGULAIR) 10 MG tablet Take 10 mg by mouth daily. 10/27/22   [provider]  Multiple Vitamins-Minerals (MULTIVITAMIN GUMMIES ADULT PO) Take 1 tablet by mouth daily.    [provider]  nicotine (NICODERM CQ) 21 mg/24hr patch Place 1 patch (21 mg total) onto the skin daily. 06/20/22   Waymon Budge, MD  ondansetron (ZOFRAN) 4 MG tablet Take one tablet by mouth once daily, as needed , for nausea 12/12/22   Kerri Perches, MD  potassium chloride (KLOR-CON) 10 MEQ tablet Take 1 tablet (10 mEq total) by mouth 2 (two) times daily. 02/01/22   Kerri Perches, MD  pregabalin (LYRICA) 75 MG capsule Take 75 mg by mouth daily.    [provider]  RABEprazole (ACIPHEX) 20 MG tablet TAKE 1 TABLET TWICE DAILY 12/28/22   Pyrtle, Carie Caddy, MD  RESTASIS 0.05 % ophthalmic emulsion Place 2 drops into both eyes daily. 07/29/20   [provider]  risperiDONE (RISPERDAL) 0.5 MG tablet Take 1 tablet (0.5 mg total) by mouth at bedtime.  01/04/23   Myrlene Broker, MD  rosuvastatin (CRESTOR) 5 MG tablet TAKE 1 TABLET AT BEDTIME 06/12/22   Kerri Perches, MD  sertraline (ZOLOFT) 100 MG tablet Take 1 tablet (100 mg total) by mouth every morning. 01/04/23   Myrlene Broker, MD  spironolactone (ALDACTONE) 50 MG tablet TAKE 1 TABLET (50 MG TOTAL) BY MOUTH DAILY. DISCONTINUE SPIRONOLACTONE 25MG  Patient taking differently: Take 50 mg by mouth daily. Discontinue Spironolactone 25 mg 04/18/22   Kerri Perches, MD  telmisartan (MICARDIS) 20 MG tablet Take 10 mg by mouth daily. 11/23/21   [provider]  traZODone (DESYREL) 150 MG tablet Take 1 tablet (150 mg total) by mouth at bedtime. 01/04/23   Myrlene Broker, MD  UNABLE TO FIND Med Name:  G5 Mattress 07/31/22   Kerri Perches, MD  vitamin E 180 MG (400 UNITS) capsule Take 400 Units by mouth daily.    [provider]    Physical Exam: BP (!) 155/79 (BP Location: Left Arm)   Pulse 88   Temp 98.3 F (36.8 C) (Oral)   Resp 16   Ht 4\' 11"  (1.499 m)   Wt 70.4 kg   SpO2 100%   BMI 31.35 kg/m   General: 72 y.o. year-old female well developed well nourished in no acute distress.  Alert and oriented x3. HEENT: NCAT, EOMI, dry mucous membrane Neck: Supple, trachea medial Cardiovascular: Regular rate and rhythm with no rubs or gallops.  No thyromegaly or JVD noted.  No lower extremity edema. 2/4 pulses in all 4 extremities. Respiratory: Clear to auscultation with no wheezes or rales. Good inspiratory effort. Abdomen: Soft, nontender nondistended with normal bowel sounds x4 quadrants. Muskuloskeletal: No cyanosis, clubbing or edema noted bilaterally Neuro: CN II-XII intact, strength 5/5 x 4, sensation, reflexes intact Skin: No ulcerative lesions noted or rashes Psychiatry: Judgement and insight appear normal. Mood is appropriate for condition and setting          Labs on Admission:  Basic Metabolic Panel: Recent Labs  Lab 01/12/23 1706  NA 138  K  5.2*  CL 106  CO2 24  GLUCOSE 240*  BUN 34*  CREATININE 1.31*  CALCIUM 10.1   Liver Function Tests: Recent Labs  Lab 01/12/23 1706  AST 21  ALT 19  ALKPHOS 87  BILITOT 0.4  PROT 8.1  ALBUMIN 4.3   No results for input(s): "LIPASE", "AMYLASE" in the last 168 hours. Recent Labs  Lab 01/12/23 1706  AMMONIA 16   CBC: Recent Labs  Lab 01/12/23 1706  WBC 6.2  NEUTROABS 5.0  HGB 11.0*  HCT 38.4  MCV 76.5*  PLT 295   Cardiac Enzymes: No results for input(s): "CKTOTAL", "CKMB", "CKMBINDEX", "TROPONINI" in the last 168 hours.  BNP (last 3 results) Recent  Labs    11/22/22 1233  BNP 180.0*    ProBNP (last 3 results) No results for input(s): "PROBNP" in the last 8760 hours.  CBG: Recent Labs  Lab 01/12/23 1811  GLUCAP 234*    Radiological Exams on Admission: DG Chest Portable 1 View  Result Date: 01/12/2023 CLINICAL DATA:  Altered mental status EXAM: PORTABLE CHEST 1 VIEW COMPARISON:  11/22/2022 FINDINGS: Heart size within normal limits. Low lung volumes. Mild linear right basilar atelectasis. No pleural effusion or pneumothorax. IMPRESSION: Low lung volumes with mild linear right basilar atelectasis. Electronically Signed   By: Duanne Guess D.O.   On: 01/12/2023 19:31   CT Head Wo Contrast  Result Date: 01/12/2023 CLINICAL DATA:  Head and neck trauma EXAM: CT HEAD WITHOUT CONTRAST CT CERVICAL SPINE WITHOUT CONTRAST TECHNIQUE: Multidetector CT imaging of the head and cervical spine was performed following the standard protocol without intravenous contrast. Multiplanar CT image reconstructions of the cervical spine were also generated. RADIATION DOSE REDUCTION: This exam was performed according to the departmental dose-optimization program which includes automated exposure control, adjustment of the mA and/or kV according to patient size and/or use of iterative reconstruction technique. COMPARISON:  11/22/2022 CT head, 10/26/2022 CT head and cervical spine  FINDINGS: CT HEAD FINDINGS Brain: No evidence of acute infarct, hemorrhage, mass, mass effect, or midline shift. No hydrocephalus or extra-axial fluid collection. Redemonstrated encephalomalacia in the bilateral inferior frontal lobes, status post prior planum sphenoidal meningioma resection, with residual meningioma not well seen on CT. Surgical clips in the anterior cranial fossa. Vascular: No hyperdense vessel. Atherosclerotic calcifications in the intracranial carotid and vertebral arteries. Skull: Negative for fracture or focal lesion. Prior bifrontal craniotomy. Sinuses/Orbits: No acute finding. Other: The mastoid air cells are well aerated. CT CERVICAL SPINE FINDINGS Alignment: No traumatic listhesis. Skull base and vertebrae: No acute fracture or suspicious osseous lesion. Soft tissues and spinal canal: No prevertebral fluid or swelling. No visible canal hematoma. Partially calcified nodule in the right lobe and isthmus or most recently evaluated with ultrasound on 02/02/2021. Disc levels: Degenerative changes in the cervical spine.No high-grade spinal canal stenosis. Upper chest: No focal pulmonary opacity or pleural effusion. IMPRESSION: 1. No acute intracranial process. 2. No acute fracture or traumatic listhesis in the cervical spine. 3. Redemonstrated encephalomalacia in the bilateral inferior frontal lobes, status post prior planum sphenoidal meningioma resection. Electronically Signed   By: Wiliam Ke M.D.   On: 01/12/2023 19:14   CT Cervical Spine Wo Contrast  Result Date: 01/12/2023 CLINICAL DATA:  Head and neck trauma EXAM: CT HEAD WITHOUT CONTRAST CT CERVICAL SPINE WITHOUT CONTRAST TECHNIQUE: Multidetector CT imaging of the head and cervical spine was performed following the standard protocol without intravenous contrast. Multiplanar CT image reconstructions of the cervical spine were also generated. RADIATION DOSE REDUCTION: This exam was performed according to the departmental  dose-optimization program which includes automated exposure control, adjustment of the mA and/or kV according to patient size and/or use of iterative reconstruction technique. COMPARISON:  11/22/2022 CT head, 10/26/2022 CT head and cervical spine FINDINGS: CT HEAD FINDINGS Brain: No evidence of acute infarct, hemorrhage, mass, mass effect, or midline shift. No hydrocephalus or extra-axial fluid collection. Redemonstrated encephalomalacia in the bilateral inferior frontal lobes, status post prior planum sphenoidal meningioma resection, with residual meningioma not well seen on CT. Surgical clips in the anterior cranial fossa. Vascular: No hyperdense vessel. Atherosclerotic calcifications in the intracranial carotid and vertebral arteries. Skull: Negative for fracture or focal lesion. Prior bifrontal craniotomy. Sinuses/Orbits:  No acute finding. Other: The mastoid air cells are well aerated. CT CERVICAL SPINE FINDINGS Alignment: No traumatic listhesis. Skull base and vertebrae: No acute fracture or suspicious osseous lesion. Soft tissues and spinal canal: No prevertebral fluid or swelling. No visible canal hematoma. Partially calcified nodule in the right lobe and isthmus or most recently evaluated with ultrasound on 02/02/2021. Disc levels: Degenerative changes in the cervical spine.No high-grade spinal canal stenosis. Upper chest: No focal pulmonary opacity or pleural effusion. IMPRESSION: 1. No acute intracranial process. 2. No acute fracture or traumatic listhesis in the cervical spine. 3. Redemonstrated encephalomalacia in the bilateral inferior frontal lobes, status post prior planum sphenoidal meningioma resection. Electronically Signed   By: Wiliam Ke M.D.   On: 01/12/2023 19:14    EKG: I independently viewed the EKG done and my findings are as followed: Normal sinus rhythm at a rate of 76 bpm  Assessment/Plan Present on Admission:  Acute metabolic encephalopathy  AKI (acute kidney injury) (HCC)   Dehydration  Hyperkalemia  Microcytic anemia  Tobacco use disorder  Essential hypertension  Mixed hyperlipidemia  Chronic obstructive pulmonary disease (HCC)  Active Problems:   Mixed hyperlipidemia   Essential hypertension   Seizure disorder (HCC)   Microcytic anemia   AKI (acute kidney injury) (HCC)   Hyperkalemia   Type 2 diabetes mellitus with hyperglycemia, with long-term current use of insulin (HCC)   Tobacco use disorder   Dehydration   Chronic obstructive pulmonary disease (HCC)   Acute metabolic encephalopathy   History of CVA (cerebrovascular accident)  Acute metabolic encephalopathy possibly due to multifactorial This may be due to polypharmacy and/or inappropriate medication use. Patient lives alone at home and may require help with taking her medications appropriately ED PA states that patient has no change in urinary incontinence, urinalysis was normal.  Urine culture pending prior to start of antibiotics  Acute kidney injury Dehydration BUN/creatinine 34/1.31 (baseline creatinine at 0.9-1.0) Continue gentle hydration Renally adjust medications, avoid nephrotoxic agents/dehydration/hypotension  Hyperkalemia K+ 5.2, IV hydration was provided Repeat BMP  Microcytic anemia MCV 76.5, hemoglobin 11.0 Iron studies will be done  Type 2 diabetes mellitus with hyperglycemia Patient takes 20 to 26 units of glargine at bedtime and takes 5 to 8 units of NovoLog 3 times a day with meals at home Continue Semglee 10 units nightly and adjust dose accordingly Continue ISS and hypoglycemia protocol  Essential hypertension Continue Coreg, amlodipine  Mixed hyperlipidemia Continue Crestor, Zetia  Acquired hypothyroidism Continue Synthroid  History of CVA Continue aspirin, Crestor  COPD Continue DuoNeb, Singulair  Seizure disorder Continue lamotrigine, pregabalin  Tobacco use disorder Patient was counseled on tobacco use cessation Continue nicotine  patch  Overactive bladder Continue Myrbetriq  Obesity (BMI 31.35) Diet and lifestyle modification  DVT prophylaxis: Lovenox    Advance Care Planning: CODE STATUS: Full code  Consults: None  Family Communication: None at bedside  Severity of Illness: The appropriate patient status for this patient is OBSERVATION. Observation status is judged to be reasonable and necessary in order to provide the required intensity of service to ensure the patient's safety. The patient's presenting symptoms, physical exam findings, and initial radiographic and laboratory data in the context of their medical condition is felt to place them at decreased risk for further clinical deterioration. Furthermore, it is anticipated that the patient will be medically stable for discharge from the hospital within 2 midnights of admission.   Author: Frankey Shown, DO 01/12/2023 10:37 PM  For on call review [email or web address].

## 2023-01-13 ENCOUNTER — Encounter: Payer: Self-pay | Admitting: Internal Medicine

## 2023-01-13 DIAGNOSIS — F17200 Nicotine dependence, unspecified, uncomplicated: Secondary | ICD-10-CM | POA: Diagnosis not present

## 2023-01-13 DIAGNOSIS — N179 Acute kidney failure, unspecified: Secondary | ICD-10-CM | POA: Diagnosis not present

## 2023-01-13 DIAGNOSIS — I10 Essential (primary) hypertension: Secondary | ICD-10-CM | POA: Diagnosis not present

## 2023-01-13 DIAGNOSIS — G9341 Metabolic encephalopathy: Secondary | ICD-10-CM | POA: Diagnosis not present

## 2023-01-13 LAB — CK: Total CK: 137 U/L (ref 38–234)

## 2023-01-13 LAB — FERRITIN: Ferritin: 236 ng/mL (ref 11–307)

## 2023-01-13 LAB — CBC
HCT: 35.9 % — ABNORMAL LOW (ref 36.0–46.0)
Hemoglobin: 10.4 g/dL — ABNORMAL LOW (ref 12.0–15.0)
MCH: 21.9 pg — ABNORMAL LOW (ref 26.0–34.0)
MCHC: 29 g/dL — ABNORMAL LOW (ref 30.0–36.0)
MCV: 75.6 fL — ABNORMAL LOW (ref 80.0–100.0)
Platelets: 292 10*3/uL (ref 150–400)
RBC: 4.75 MIL/uL (ref 3.87–5.11)
RDW: 14.7 % (ref 11.5–15.5)
WBC: 7.9 10*3/uL (ref 4.0–10.5)
nRBC: 0 % (ref 0.0–0.2)

## 2023-01-13 LAB — COMPREHENSIVE METABOLIC PANEL
ALT: 16 U/L (ref 0–44)
AST: 18 U/L (ref 15–41)
Albumin: 4 g/dL (ref 3.5–5.0)
Alkaline Phosphatase: 72 U/L (ref 38–126)
Anion gap: 8 (ref 5–15)
BUN: 26 mg/dL — ABNORMAL HIGH (ref 8–23)
CO2: 23 mmol/L (ref 22–32)
Calcium: 9.8 mg/dL (ref 8.9–10.3)
Chloride: 107 mmol/L (ref 98–111)
Creatinine, Ser: 1 mg/dL (ref 0.44–1.00)
GFR, Estimated: 60 mL/min — ABNORMAL LOW (ref >60)
Glucose, Bld: 121 mg/dL — ABNORMAL HIGH (ref 70–99)
Potassium: 4.5 mmol/L (ref 3.5–5.1)
Sodium: 138 mmol/L (ref 135–145)
Total Bilirubin: 0.4 mg/dL (ref 0.3–1.2)
Total Protein: 7.5 g/dL (ref 6.5–8.1)

## 2023-01-13 LAB — HEMOGLOBIN A1C
Hgb A1c MFr Bld: 5.8 % — ABNORMAL HIGH (ref 4.8–5.6)
Mean Plasma Glucose: 119.76 mg/dL

## 2023-01-13 LAB — IRON AND TIBC
Iron: 71 ug/dL (ref 28–170)
Saturation Ratios: 20 % (ref 10.4–31.8)
TIBC: 361 ug/dL (ref 250–450)
UIBC: 290 ug/dL

## 2023-01-13 LAB — GLUCOSE, CAPILLARY
Glucose-Capillary: 140 mg/dL — ABNORMAL HIGH (ref 70–99)
Glucose-Capillary: 152 mg/dL — ABNORMAL HIGH (ref 70–99)
Glucose-Capillary: 181 mg/dL — ABNORMAL HIGH (ref 70–99)
Glucose-Capillary: 140 mg/dL — ABNORMAL HIGH (ref 70–99)

## 2023-01-13 LAB — MAGNESIUM: Magnesium: 1.8 mg/dL (ref 1.7–2.4)

## 2023-01-13 LAB — FOLATE: Folate: 13.3 ng/mL (ref >5.9)

## 2023-01-13 LAB — TSH: TSH: 0.529 u[IU]/mL (ref 0.350–4.500)

## 2023-01-13 LAB — PHOSPHORUS: Phosphorus: 2.9 mg/dL (ref 2.5–4.6)

## 2023-01-13 LAB — VITAMIN B12: Vitamin B-12: 682 pg/mL (ref 180–914)

## 2023-01-13 MED ORDER — RISPERIDONE 0.5 MG PO TABS
0.5000 mg | ORAL_TABLET | Freq: Every day | ORAL | Status: DC
  Administered 2023-01-13: 0.5 mg via ORAL
  Filled 2023-01-13: qty 1

## 2023-01-13 MED ORDER — MAGNESIUM SULFATE 2 GM/50ML IV SOLN
2.0000 g | Freq: Once | INTRAVENOUS | Status: AC
  Administered 2023-01-13: 2 g via INTRAVENOUS
  Filled 2023-01-13: qty 50

## 2023-01-13 MED ORDER — SODIUM CHLORIDE 0.9 % IV SOLN: INTRAVENOUS | Status: AC

## 2023-01-13 MED ORDER — SERTRALINE HCL 100 MG PO TABS
100.0000 mg | ORAL_TABLET | Freq: Every day | ORAL | Status: DC
  Administered 2023-01-14 – 2023-01-26 (×12): 100 mg via ORAL
  Filled 2023-01-13: qty 1
  Filled 2023-01-13 (×2): qty 2
  Filled 2023-01-13: qty 1
  Filled 2023-01-13: qty 2
  Filled 2023-01-13 (×2): qty 1
  Filled 2023-01-13: qty 2
  Filled 2023-01-13 (×4): qty 1
  Filled 2023-01-13 (×2): qty 2
  Filled 2023-01-13: qty 1

## 2023-01-13 MED ORDER — POLYETHYLENE GLYCOL 3350 17 G PO PACK
17.0000 g | PACK | Freq: Every day | ORAL | Status: DC
  Administered 2023-01-13 – 2023-01-18 (×5): 17 g via ORAL
  Filled 2023-01-13 (×6): qty 1

## 2023-01-13 NOTE — Progress Notes (Signed)
BP elevated this a.m. MD notified. Pt reported nausea, PRN Zofran given.

## 2023-01-13 NOTE — Evaluation (Signed)
Physical Therapy Evaluation Patient Details Name: MAURY BAMBA MRN: [id number] DOB: 1951/01/17 Today's Date: 01/13/2023  History of Present Illness  a 72 y.o. female with medical history significant of hypertension, hyperlipidemia, GERD, COPD, hypothyroidism, seizures, paralysis, stroke who presents to the emergency department due to altered mental status.  Patient was unable to provide a history possibly due to current altered mental status, history was obtained from ED PA and medical record.  Per report, patient has nurse aides at her home and she was reported to have had increased urinary frequency since past several days and has not been acting like herself.  Patient was also noted to have different pills for different days in her pill dispenser, it is unknown what RMG's patient's medication as the nurse aide states that patient was only assisted with ADLs without RN to arrange for medications.  Regarding the urinary frequency, daughter (by phone) endorsed that patient has had incontinence since June of this year and usually wets her diapers per ED PA.  At baseline, patient ambulates with cane and walker  No report of fever, chills, headache, chest pain, shortness of breath, nausea or vomiting.  Clinical Impression   Pt is a 72yo female presenting to [facility] due to reason stated in the HPI.  Pt tolerated today's Physical Therapy Evaluation, but show small level of agitation with therapy today. Showing decreased functional mobility, safety, ambulation and ADL due to muscle weakness and impairments noted below. . Based upon these deficits/impairments, pt would benefit from skilled acute physical therapy services to address the above deficits and improve their functional status. PT recommends pt discharge to rehab facility, however Home Health PT services if pt declines rehab offer, in order to improve pt's functional status, safety and independence with functional mobility and  overall QOL.             If plan is discharge home, recommend the following: A lot of help with walking and/or transfers;A lot of help with bathing/dressing/bathroom   Can travel by private vehicle        Equipment Recommendations    Recommendations for Other Services  OT consult    Functional Status Assessment Patient has had a recent decline in their functional status and demonstrates the ability to make significant improvements in function in a reasonable and predictable amount of time.     Precautions / Restrictions Precautions Precautions: Fall Restrictions Weight Bearing Restrictions: No      Mobility  Bed Mobility Overal bed mobility: Needs Assistance Bed Mobility: Supine to Sit     Supine to sit: Min assist     General bed mobility comments: min assist for elevating trunk to EOB. modified independent for scooting anterior to EOB. Patient Response: Impulsive  Transfers Overall transfer level: Needs assistance Equipment used: Rolling walker (2 wheels) Transfers: Sit to/from Stand Sit to Stand: Contact guard assist           General transfer comment: CGA for balance performing sit/stand from EOB to RW. fast and impulsive movement.    Ambulation/Gait Ambulation/Gait assistance: Contact guard assist Gait Distance (Feet): 12 Feet Assistive device: Rolling walker (2 wheels) Gait Pattern/deviations: Step-through pattern, Decreased step length - right, Decreased step length - left, Decreased weight shift to right, Decreased weight shift to left, Knee hyperextension - right, Knee hyperextension - left       General Gait Details: slow, labored gat with RW and hyperexension in BLE during stance phase. Increased UE support on RW.  Stairs            Wheelchair Mobility     Tilt Bed Tilt Bed Patient Response: Impulsive  Modified Rankin (Stroke Patients Only)       Balance Overall balance assessment: Needs assistance Sitting-balance  support: No upper extremity supported Sitting balance-Leahy Scale: Fair Sitting balance - Comments: fair/fair sitting EOB, poor weight shifts Postural control: Posterior lean Standing balance support: Reliant on assistive device for balance Standing balance-Leahy Scale: Fair Standing balance comment: poor/fair standing balance, requires UE assist no RW.                             Pertinent Vitals/Pain Pain Assessment Pain Assessment: No/denies pain    Home Living Family/patient expects to be discharged to:: Private residence Living Arrangements: Alone Available Help at Discharge: Personal care attendant;Available PRN/intermittently;Family;Friend(s) Type of Home: Apartment Home Access: Level entry       Home Layout: One level Home Equipment: Agricultural consultant (2 wheels);Cane - single point;BSC/3in1;Hand held shower head;Grab bars - toilet;Grab bars - tub/shower;Cane - quad;Rollator (4 wheels);Shower seat - built in;Wheelchair - manual;Hospital bed      Prior Function Prior Level of Function : Needs assist             Mobility Comments: household ambulator using Rollator most of time, sometimes single point cane, uses electric carts in grocery stores ADLs Comments: home aides 8 hours/day x 5 days/week; two different caregivers on weekend days.     Extremity/Trunk Assessment   Upper Extremity Assessment Upper Extremity Assessment: Generalized weakness    Lower Extremity Assessment Lower Extremity Assessment: Generalized weakness    Cervical / Trunk Assessment Cervical / Trunk Assessment: Kyphotic  Communication   Communication Communication: No apparent difficulties Cueing Techniques: Tactile cues;Verbal cues  Cognition Arousal: Alert Behavior During Therapy: WFL for tasks assessed/performed Overall Cognitive Status: Within Functional Limits for tasks assessed                                          General Comments       Exercises     Assessment/Plan    PT Assessment Patient needs continued PT services  PT Problem List Decreased strength;Decreased activity tolerance;Decreased balance;Decreased mobility;Decreased coordination       PT Treatment Interventions DME instruction;Gait training;Functional mobility training;Therapeutic activities;Therapeutic exercise;Balance training;Neuromuscular re-education    PT Goals (Current goals can be found in the Care Plan section)  Acute Rehab PT Goals Patient Stated Goal: Go home PT Goal Formulation: With patient Time For Goal Achievement: 01/27/23 Potential to Achieve Goals: Good    Frequency Min 3X/week     Co-evaluation               AM-PAC PT "6 Clicks" Mobility  Outcome Measure Help needed turning from your back to your side while in a flat bed without using bedrails?: A Little Help needed moving from lying on your back to sitting on the side of a flat bed without using bedrails?: A Little Help needed moving to and from a bed to a chair (including a wheelchair)?: A Little Help needed standing up from a chair using your arms (e.g., wheelchair or bedside chair)?: A Little Help needed to walk in hospital room?: A Little Help needed climbing 3-5 steps with a railing? : A Lot 6 Click Score: 17  End of Session Equipment Utilized During Treatment: Gait belt Activity Tolerance: Patient tolerated treatment well;Patient limited by fatigue Patient left: in bed;with bed alarm set;with family/visitor present Nurse Communication: Mobility status PT Visit Diagnosis: Unsteadiness on feet (R26.81);Muscle weakness (generalized) (M62.81)    Time: 2956-2130 PT Time Calculation (min) (ACUTE ONLY): 20 min   Charges:   PT Evaluation $PT Eval Low Complexity: 1 Low   PT General Charges $$ ACUTE PT VISIT: 1 Visit         Nelida Meuse PT, DPT Physical Therapist with [facility] [phone] office  Nelida Meuse 01/13/2023, 12:03 PM

## 2023-01-13 NOTE — Progress Notes (Signed)
PROGRESS NOTE  [name] MVH:[id number] DOB: 10/12/1950 DOA: 01/12/2023 PCP: Kerri Perches, MD  Brief History:  72 year old female with history of diabetes mellitus type 2, hypertension, hyperlipidemia, COPD, stroke, seizure, hypothyroidism, tobacco abuse presenting with altered mental status.  The patient has private aides at home that help the patient throughout the day.  Apparently, the patient states have reported that the patient has been more somnolent and less interactive.  The patient has a history of urinary incontinence for which she follows urology.  Notably, the patient saw urology on 12/18/2022.  At that time, the patient's Myrbetriq dose was increased to 50 mg daily.  For her urge incontinence, UA did not suggest UTI.  Surgical options were considered, but the patient elected to proceed with expectant management.  The patient has also recently followed up with psychiatry, Dr. Tenny Craw on 01/04/2023.  During that visit, the patient's Wellbutrin was discontinued.  She was continued on Zoloft 100 mg daily, Lamictal 100 mg twice daily, Risperdal 0.5 mg at at bedtime, trazodone 150 mg at at bedtime, and Ativan 0.5 mg twice daily. The patient's niece relates that the patient has had worsening urinary incontinence and increasing difficulty maintaining her private living situation with home health aides. The been no reports of fevers, chills, chest pain, shortness breath, vomiting, diarrhea, abdominal pain. In the ED, the patient was afebrile hemodynamically stable with oxygen saturation 100% on room air.  WBC 6.2, hemoglobin 11.0, platelets 295.  Sodium 138, potassium 5.2, bicarbonate 24, serum creatinine 1.31.  AST 21, ALT 19, alkaline phosphatase 87, total bilirubin 0.4.  Corrected calcium 10.1.  VBG showed 7.3 5/50/41/27.  CT of the brain was negative for any acute findings.  CT of the cervical spine was negative for traumatic listhesis.  Chest x-ray showed basilar atelectasis.  The  patient was admitted for further evaluation and treatment of her altered mental status.    Assessment/Plan: Acute metabolic encephalopathy -Multifactorial including dehydration/AKI, polypharmacy -UA negative for pyuria -B12 -TSH -Ammonia--16 -Folic acid -CT brain negative -Holding Lyrica and Ativan -PDMP reviewed--- hydrocodone 5/325, #15, last refill 12/14/2022; Ativan 0.5 mg, #60, last refill 01/01/2023 -Lyrica 75 mg, #90, last refill 11/17/2022 -Holding hydrocodone -Mental status improving but not yet at baseline  Acute kidney injury -Baseline creatinine 0.8-1.0 -Presented with serum creatinine 1.31 -Continue IV fluids>> improving  Hyperkalemia -Improved with IV fluids  Controlled diabetes mellitus type 2 -11/03/2022 hemoglobin A1c 5.3 -Continue NovoLog sliding scale -Reduce dose Semglee  Essential hypertension -Continue carvedilol -Holding amlodipine  Mixed hyperlipidemia -Continue statin  Tobacco abuse -Tobacco cessation discussed  Hypothyroidism -Continue Synthroid  COPD -Continue Singulair  Depression/anxiety -Continue Zoloft, Lamictal       Family Communication:  niece updated 11/2  Consultants:  none  Code Status:  FULL   DVT Prophylaxis: [place] Lovenox   Procedures: As Listed in Progress Note Above  Antibiotics: None       Subjective: Patient denies fevers, chills, headache, chest pain, dyspnea, nausea, vomiting, diarrhea, abdominal pain, dysuria, hematuria, hematochezia, and melena.   Objective: Vitals:   01/13/23 0617 01/13/23 0633 01/13/23 0829 01/13/23 0832  BP: (!) 163/57 (!) 160/58 (!) 143/66 (!) 143/66  Pulse: 86  80 80  Resp: 18  19   Temp: 97.6 F (36.4 C)  98.5 F (36.9 C)   TempSrc: Axillary  Oral   SpO2: 98%  100%   Weight:      Height:  Intake/Output Summary (Last 24 hours) at 01/13/2023 0932 Last data filed at 01/13/2023 5784 Gross per 24 hour  Intake 769.73 ml  Output --  Net 769.73 ml   Weight  change:  Exam:  General:  Pt is alert, follows commands appropriately, not in acute distress HEENT: No icterus, No thrush, No neck mass, [place]/AT Cardiovascular: RRR, S1/S2, no rubs, no gallops Respiratory: bibasilar rales. No wheeze Abdomen: Soft/+BS, non tender, non distended, no guarding Extremities: No edema, No lymphangitis, No petechiae, No rashes, no synovitis   Data Reviewed: I have personally reviewed following labs and imaging studies Basic Metabolic Panel: Recent Labs  Lab 01/12/23 1706 01/13/23 0512  NA 138 138  K 5.2* 4.5  CL 106 107  CO2 24 23  GLUCOSE 240* 121*  BUN 34* 26*  CREATININE 1.31* 1.00  CALCIUM 10.1 9.8  MG  --  1.8  PHOS  --  2.9   Liver Function Tests: Recent Labs  Lab 01/12/23 1706 01/13/23 0512  AST 21 18  ALT 19 16  ALKPHOS 87 72  BILITOT 0.4 0.4  PROT 8.1 7.5  ALBUMIN 4.3 4.0   No results for input(s): "LIPASE", "AMYLASE" in the last 168 hours. Recent Labs  Lab 01/12/23 1706  AMMONIA 16   Coagulation Profile: No results for input(s): "INR", "PROTIME" in the last 168 hours. CBC: Recent Labs  Lab 01/12/23 1706 01/13/23 0512  WBC 6.2 7.9  NEUTROABS 5.0  --   HGB 11.0* 10.4*  HCT 38.4 35.9*  MCV 76.5* 75.6*  PLT 295 292   Cardiac Enzymes: No results for input(s): "CKTOTAL", "CKMB", "CKMBINDEX", "TROPONINI" in the last 168 hours. BNP: Invalid input(s): "POCBNP" CBG: Recent Labs  Lab 01/12/23 1811 01/12/23 2324 01/13/23 0723  GLUCAP 234* 203* 140*   HbA1C: No results for input(s): "HGBA1C" in the last 72 hours. Urine analysis:    Component Value Date/Time   COLORURINE YELLOW 01/12/2023 1824   APPEARANCEUR CLEAR 01/12/2023 1824   APPEARANCEUR Clear 12/18/2022 1042   LABSPEC 1.015 01/12/2023 1824   PHURINE 5.0 01/12/2023 1824   GLUCOSEU NEGATIVE 01/12/2023 1824   HGBUR NEGATIVE 01/12/2023 1824   HGBUR negative 03/29/2010 0834   BILIRUBINUR NEGATIVE 01/12/2023 1824   BILIRUBINUR Negative 12/18/2022 1042    KETONESUR NEGATIVE 01/12/2023 1824   PROTEINUR NEGATIVE 01/12/2023 1824   UROBILINOGEN 0.2 08/31/2021 1434   UROBILINOGEN 0.2 06/02/2014 0140   NITRITE NEGATIVE 01/12/2023 1824   LEUKOCYTESUR NEGATIVE 01/12/2023 1824   Sepsis Labs: @LABRCNTIP (procalcitonin:4,lacticidven:4) )No results found for this or any previous visit (from the past 240 hour(s)).   Scheduled Meds:  aspirin EC  81 mg Oral Q breakfast   carvedilol  12.5 mg Oral BID WC   enoxaparin (LOVENOX) injection  40 mg Subcutaneous Q24H   ezetimibe  10 mg Oral Daily   insulin aspart  0-15 Units Subcutaneous TID WC   insulin aspart  0-5 Units Subcutaneous QHS   insulin glargine-yfgn  10 Units Subcutaneous QHS   lamoTRIgine  100 mg Oral BID   mirabegron ER  50 mg Oral Daily   montelukast  10 mg Oral Daily   nicotine  21 mg Transdermal Daily   rosuvastatin  5 mg Oral QHS   Continuous Infusions:  Procedures/Studies: DG Chest Portable 1 View  Result Date: 01/12/2023 CLINICAL DATA:  Altered mental status EXAM: PORTABLE CHEST 1 VIEW COMPARISON:  11/22/2022 FINDINGS: Heart size within normal limits. Low lung volumes. Mild linear right basilar atelectasis. No pleural effusion or pneumothorax. IMPRESSION: Low lung  volumes with mild linear right basilar atelectasis. Electronically Signed   By: Duanne Guess D.O.   On: 01/12/2023 19:31   CT Head Wo Contrast  Result Date: 01/12/2023 CLINICAL DATA:  Head and neck trauma EXAM: CT HEAD WITHOUT CONTRAST CT CERVICAL SPINE WITHOUT CONTRAST TECHNIQUE: Multidetector CT imaging of the head and cervical spine was performed following the standard protocol without intravenous contrast. Multiplanar CT image reconstructions of the cervical spine were also generated. RADIATION DOSE REDUCTION: This exam was performed according to the departmental dose-optimization program which includes automated exposure control, adjustment of the mA and/or kV according to patient size and/or use of iterative  reconstruction technique. COMPARISON:  11/22/2022 CT head, 10/26/2022 CT head and cervical spine FINDINGS: CT HEAD FINDINGS Brain: No evidence of acute infarct, hemorrhage, mass, mass effect, or midline shift. No hydrocephalus or extra-axial fluid collection. Redemonstrated encephalomalacia in the bilateral inferior frontal lobes, status post prior planum sphenoidal meningioma resection, with residual meningioma not well seen on CT. Surgical clips in the anterior cranial fossa. Vascular: No hyperdense vessel. Atherosclerotic calcifications in the intracranial carotid and vertebral arteries. Skull: Negative for fracture or focal lesion. Prior bifrontal craniotomy. Sinuses/Orbits: No acute finding. Other: The mastoid air cells are well aerated. CT CERVICAL SPINE FINDINGS Alignment: No traumatic listhesis. Skull base and vertebrae: No acute fracture or suspicious osseous lesion. Soft tissues and spinal canal: No prevertebral fluid or swelling. No visible canal hematoma. Partially calcified nodule in the right lobe and isthmus or most recently evaluated with ultrasound on 02/02/2021. Disc levels: Degenerative changes in the cervical spine.No high-grade spinal canal stenosis. Upper chest: No focal pulmonary opacity or pleural effusion. IMPRESSION: 1. No acute intracranial process. 2. No acute fracture or traumatic listhesis in the cervical spine. 3. Redemonstrated encephalomalacia in the bilateral inferior frontal lobes, status post prior planum sphenoidal meningioma resection. Electronically Signed   By: Wiliam Ke M.D.   On: 01/12/2023 19:14   CT Cervical Spine Wo Contrast  Result Date: 01/12/2023 CLINICAL DATA:  Head and neck trauma EXAM: CT HEAD WITHOUT CONTRAST CT CERVICAL SPINE WITHOUT CONTRAST TECHNIQUE: Multidetector CT imaging of the head and cervical spine was performed following the standard protocol without intravenous contrast. Multiplanar CT image reconstructions of the cervical spine were also  generated. RADIATION DOSE REDUCTION: This exam was performed according to the departmental dose-optimization program which includes automated exposure control, adjustment of the mA and/or kV according to patient size and/or use of iterative reconstruction technique. COMPARISON:  11/22/2022 CT head, 10/26/2022 CT head and cervical spine FINDINGS: CT HEAD FINDINGS Brain: No evidence of acute infarct, hemorrhage, mass, mass effect, or midline shift. No hydrocephalus or extra-axial fluid collection. Redemonstrated encephalomalacia in the bilateral inferior frontal lobes, status post prior planum sphenoidal meningioma resection, with residual meningioma not well seen on CT. Surgical clips in the anterior cranial fossa. Vascular: No hyperdense vessel. Atherosclerotic calcifications in the intracranial carotid and vertebral arteries. Skull: Negative for fracture or focal lesion. Prior bifrontal craniotomy. Sinuses/Orbits: No acute finding. Other: The mastoid air cells are well aerated. CT CERVICAL SPINE FINDINGS Alignment: No traumatic listhesis. Skull base and vertebrae: No acute fracture or suspicious osseous lesion. Soft tissues and spinal canal: No prevertebral fluid or swelling. No visible canal hematoma. Partially calcified nodule in the right lobe and isthmus or most recently evaluated with ultrasound on 02/02/2021. Disc levels: Degenerative changes in the cervical spine.No high-grade spinal canal stenosis. Upper chest: No focal pulmonary opacity or pleural effusion. IMPRESSION: 1. No acute intracranial process. 2.  No acute fracture or traumatic listhesis in the cervical spine. 3. Redemonstrated encephalomalacia in the bilateral inferior frontal lobes, status post prior planum sphenoidal meningioma resection. Electronically Signed   By: Wiliam Ke M.D.   On: 01/12/2023 19:14    Catarina Hartshorn, DO  [facility]  If 7PM-7AM, please contact night-coverage [email or web address] Password [email or web address] 01/13/2023, 9:32 AM    LOS: 0 days

## 2023-01-13 NOTE — TOC Initial Note (Addendum)
Transition of Care [facility]) - Initial/Assessment Note    Patient Details  Name: [name] MRN: [id number] Date of Birth: 1950-04-03  Transition of Care [facility]) CM/SW Contact:    Princella Ion, LCSW Phone Number: 01/13/2023, 12:37 PM  Clinical Narrative:                 Pt from home, alone, has aides 5 days a week. PT recommended SNF; however, pt declined. CSW attempted to contact pt's son, without success. CSW spoke with pt's niece, who is POA. Niece is in agreement with [facility] services at this time. Niece did note that they are wanting to move towards long term care. CSW informed we could add social work to assist with the placement process. CSW offered choice and niece informed she did not have a preferred agency.   [facility] has accepted for PT/OT/SW. Will need orders at discharge. Information added to discharge paperwork.         Patient Goals and CMS Choice            Expected Discharge Plan and Services                                              Prior Living Arrangements/Services                       Activities of Daily Living   ADL Screening (condition at time of admission) Independently performs ADLs?: No Does the patient have a NEW difficulty with bathing/dressing/toileting/self-feeding that is expected to last >3 days?: Yes (Initiates electronic notice to provider for possible OT consult) Does the patient have a NEW difficulty with getting in/out of bed, walking, or climbing stairs that is expected to last >3 days?: Yes (Initiates electronic notice to provider for possible PT consult) Does the patient have a NEW difficulty with communication that is expected to last >3 days?: Yes (Initiates electronic notice to provider for possible SLP consult) Is the patient deaf or have difficulty hearing?: No Does the patient have difficulty seeing, even when wearing glasses/contacts?: No Does the patient have difficulty concentrating, remembering, or making  decisions?: No  Permission Sought/Granted                  Emotional Assessment              Admission diagnosis:  Urinary frequency [R35.0] Daytime somnolence [R40.0] Acute metabolic encephalopathy [G93.41] Patient Active Problem List   Diagnosis Date Noted   Acute metabolic encephalopathy 01/12/2023   History of CVA (cerebrovascular accident) 01/12/2023   Type 2 diabetes mellitus with diabetic chronic kidney disease (HCC) 12/14/2022   Lumbar radiculopathy 12/14/2022   Ataxia 10/25/2022   Lesion of skin of scalp 10/25/2022   Seborrheic dermatitis of scalp 10/15/2022   Dermatophytosis of nail 10/09/2022   Callus of foot 10/09/2022   Neuroma of foot 10/09/2022   History of shoulder surgery 10/04/2022   H/O total shoulder replacement, left 08/18/2022   Scalp cyst 08/10/2022   Right-sided chest pain 08/06/2022   CKD (chronic kidney disease) stage 3, GFR 30-59 ml/min (HCC) 07/03/2022   Skin tag 07/03/2022   Itchy scalp 07/03/2022   Left-sided chest pain 04/27/2022   Bilateral leg edema 12/21/2021   Hypoglycemia 12/20/2021   Skin lesions, generalized 12/19/2021   Insomnia due to stress 12/19/2021   Major depressive  disorder 12/11/2021   Rotator cuff tear arthropathy 11/15/2021   Anemia in chronic kidney disease (CKD) 10/12/2021   Dehydration 09/25/2021   CHF (congestive heart failure) (HCC) 09/16/2021   Urinary frequency 09/01/2021   Anemia of chronic disease 08/10/2021   Generalized pain 08/04/2021   Sciatica, right side 07/19/2021   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/10/2021   Atherosclerosis of aorta (HCC) 07/10/2021   Generalized weakness 05/12/2021   Trigger finger, right 12/12/2020   Confusion 11/24/2020   Blurry vision 11/18/2020   Cervical radiculopathy 07/09/2020   Headache 02/19/2020   Tubular adenoma of colon 02/09/2019   Benign neoplasm of cerebral meninges (HCC) 11/12/2018    Benign meningioma of brain (HCC) 10/31/2018   Osteoarthritis of both hips 07/06/2018   Left arm pain 06/11/2018   Lipoma of extremity 12/31/2017   Impingement syndrome of left shoulder region 12/27/2017   Leg weakness, bilateral 10/28/2017   Lumbar spondylosis with myelopathy 10/12/2017   Numbness of hand 10/10/2017   Chronic neck pain 07/25/2017   Chronic pain syndrome 07/25/2017   Diabetic polyneuropathy associated with type 2 diabetes mellitus (HCC) 05/19/2016   Tobacco use disorder 04/24/2016   Obesity (BMI 30.0-34.9) 04/24/2016   History of palpitations 08/09/2015   Nausea 08/09/2015   Labile hypertension 08/03/2015   Normal coronary arteries 08/03/2015   Dizziness 07/15/2015   Left-sided low back pain with left-sided sciatica 06/27/2015   Type 2 diabetes mellitus with hyperglycemia, with long-term current use of insulin (HCC) 05/06/2015   Multinodular goiter 05/06/2015   Rectocele, female 04/27/2015   Anal sphincter incontinence 04/27/2015   Pelvic relaxation due to rectocele 03/30/2015   Hyperkalemia 02/22/2015   Pulmonary hypertension (HCC) 02/22/2015   Posterior chest pain 02/21/2015   AKI (acute kidney injury) (HCC) 02/21/2015   Episodic cigarette smoking dependence 01/11/2015   Migraine without aura and without status migrainosus, not intractable 07/02/2014   Microcytic anemia 02/18/2014   Hip pain, right 12/17/2013   COPD mixed type (HCC) 09/16/2013   Chronic obstructive pulmonary disease (HCC) 09/16/2013   Acquired hypothyroidism 08/16/2013   Gastroparesis 04/28/2013   Nicotine dependence 03/09/2013   Seizure disorder (HCC) 01/19/2013   Displacement of cervical intervertebral disc without myelopathy 12/13/2012   Bursitis of shoulder 12/13/2012   Cervical disc disorder with radiculopathy of cervical region 10/31/2012   Solitary pulmonary nodule 08/19/2012   Hypersomnia disorder related to a known organic factor 06/11/2012   Pruritus 04/18/2012   Meningioma  (HCC) 11/19/2011   Mononeuritis leg 10/25/2011   Carpal tunnel syndrome of right wrist 05/23/2011   Chronic pain of right hand 05/04/2011   Polypharmacy 04/28/2011   Mood disorder (HCC) 04/28/2011   Constipation 04/13/2011   Recurrent falls 12/12/2010   Oropharyngeal dysphagia 07/12/2010   Urinary incontinence 12/16/2009   HEARING LOSS 10/26/2009   DMII (diabetes mellitus, type 2) (HCC) 07/07/2009   Mixed hyperlipidemia 12/11/2008   IBS 12/11/2008   GERD 07/29/2008   MILK PRODUCTS ALLERGY 07/29/2008   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 [place] [address] Phone: [phone] Fax: [phone]  [facility] - [place], [place] - [address]  [address] [place] [address] Phone: [phone] Fax: [phone]  [facility] [address], [place] - [address] [place] [address] [place] [address] [place] [address] Phone: [phone] Fax: [phone]     Social Determinants of Health (SDOH) Social History: SDOH Screenings   Food Insecurity: No Food Insecurity (01/12/2023)  Housing: Patient Declined (01/12/2023)  Transportation Needs: No Transportation Needs (01/12/2023)  Utilities: Not At Risk (01/12/2023)  Alcohol Screen: Low Risk  (07/17/2022)  Depression (PHQ2-9): Low Risk  (12/12/2022)  Financial Resource Strain: Low Risk  (07/17/2022)  Physical Activity: Insufficiently Active (07/17/2022)  Social Connections: Moderately Integrated (07/17/2022)  Stress: No Stress Concern Present (07/17/2022)  Tobacco Use: Medium Risk (01/12/2023)  Health Literacy: Adequate Health Literacy (12/14/2022)   SDOH  Interventions:     Readmission Risk Interventions    08/21/2022   11:54 AM 09/16/2021   10:07 AM  Readmission Risk Prevention Plan  Transportation Screening Complete Complete  Medication Review Oceanographer) Complete Complete  PCP or Specialist appointment within 3-5 days of discharge Complete   HRI or Home Care Consult Complete Complete  SW Recovery Care/Counseling Consult Complete Complete  Palliative Care Screening Not Applicable Not Applicable  Skilled Nursing Facility Not Applicable Patient Refused

## 2023-01-13 NOTE — Hospital Course (Addendum)
[name] is a 72 y.o. female with a history of diabetes mellitus type 2, hypertension, hyperlipidemia, COPD, seizure, stroke, hypothyroidism, tobacco use.  Patient presented secondary to urinary frequency and altered mental status and was found to have evidence of AKI, toxic/metabolic encephalopathy and breakthrough seizures. Home medications were adjusted and AKI treated with improvement of mental status.

## 2023-01-13 NOTE — Plan of Care (Signed)
  Problem: Acute Rehab PT Goals(only PT should resolve) Goal: Pt Will Go Supine/Side To Sit Flowsheets (Taken 01/13/2023 1205) Pt will go Supine/Side to Sit: Independently Goal: Patient Will Transfer Sit To/From Stand Flowsheets (Taken 01/13/2023 1205) Patient will transfer sit to/from stand: with modified independence Goal: Pt Will Ambulate Flowsheets (Taken 01/13/2023 1205) Pt will Ambulate:  50 feet  with modified independence  Nelida Meuse PT, DPT Physical Therapist with [facility] [phone] office

## 2023-01-13 NOTE — Assessment & Plan Note (Signed)
Her account of usee should not be considered reliable. Not clear who is buying them for her. Again pressed her to quit.

## 2023-01-13 NOTE — Progress Notes (Signed)
Assisted nursing students with placing a purewick. The patient was asking about her life alert watch. Earlier in the day the patient had a black watch on her bedside table. The watch is no longer present at the bedside. The patients son and niece were at the bedside earlier and advised the patient to ask her son or niece if they picked up the watch and took with them.  Student nurse reporting off to the primary  nurse that the patient was asking about her watch. Watch not present in the room at this time. The primary nurse will call family and inquire.

## 2023-01-13 NOTE — TOC Initial Note (Signed)
Transition of Care [facility]) - Initial/Assessment Note    Patient Details  Name: [name] MRN: [id number] Date of Birth: 1950-04-19  Transition of Care [facility]) CM/SW Contact:    Princella Ion, LCSW Phone Number: 01/13/2023, 12:46 PM  Clinical Narrative:                 Pt to discharge with [facility] services through [facility]. MD aware. Information added to discharge paperwork.     Barriers to Discharge: No Barriers Identified   Patient Goals and CMS Choice   CMS Medicare.gov Compare Post Acute Care list provided to:: Patient Represenative (must comment) (POA) Choice offered to / list presented to : [facility] POA / Guardian      Expected Discharge Plan and Services     Post Acute Care Choice: Home Health Living arrangements for the past 2 months: Single Family Home                           HH Arranged: PT, OT, Social Work, Refused SNF HH Agency: [facility] Date [facility] Agency Contacted: 01/13/23 Time HH Agency Contacted: 1240 Representative spoke with at [facility] Agency: Cindie  Prior Living Arrangements/Services Living arrangements for the past 2 months: Single Family Home Lives with:: Self Patient language and need for interpreter reviewed:: Yes Do you feel safe going back to the place where you live?: Yes      Need for Family Participation in Patient Care: Yes (Comment) Care giver support system in place?: Yes (comment) Current home services: Homehealth aide Criminal Activity/Legal Involvement Pertinent to Current Situation/Hospitalization: No - Comment as needed  Activities of Daily Living   ADL Screening (condition at time of admission) Independently performs ADLs?: No Does the patient have a NEW difficulty with bathing/dressing/toileting/self-feeding that is expected to last >3 days?: Yes (Initiates electronic notice to provider for possible OT consult) Does the patient have a NEW difficulty with getting in/out of bed, walking, or climbing stairs that is expected to  last >3 days?: Yes (Initiates electronic notice to provider for possible PT consult) Does the patient have a NEW difficulty with communication that is expected to last >3 days?: Yes (Initiates electronic notice to provider for possible SLP consult) Is the patient deaf or have difficulty hearing?: No Does the patient have difficulty seeing, even when wearing glasses/contacts?: No Does the patient have difficulty concentrating, remembering, or making decisions?: No  Permission Sought/Granted                  Emotional Assessment Appearance:: Appears stated age     Orientation: : Oriented to Self, Oriented to Place, Oriented to Situation, Oriented to  Time   Psych Involvement: No (comment)  Admission diagnosis:  Urinary frequency [R35.0] Daytime somnolence [R40.0] Acute metabolic encephalopathy [G93.41] Patient Active Problem List   Diagnosis Date Noted   Acute metabolic encephalopathy 01/12/2023   History of CVA (cerebrovascular accident) 01/12/2023   Type 2 diabetes mellitus with diabetic chronic kidney disease (HCC) 12/14/2022   Lumbar radiculopathy 12/14/2022   Ataxia 10/25/2022   Lesion of skin of scalp 10/25/2022   Seborrheic dermatitis of scalp 10/15/2022   Dermatophytosis of nail 10/09/2022   Callus of foot 10/09/2022   Neuroma of foot 10/09/2022   History of shoulder surgery 10/04/2022   H/O total shoulder replacement, left 08/18/2022   Scalp cyst 08/10/2022   Right-sided chest pain 08/06/2022   CKD (chronic kidney disease) stage 3, GFR 30-59 ml/min (  HCC) 07/03/2022   Skin tag 07/03/2022   Itchy scalp 07/03/2022   Left-sided chest pain 04/27/2022   Bilateral leg edema 12/21/2021   Hypoglycemia 12/20/2021   Skin lesions, generalized 12/19/2021   Insomnia due to stress 12/19/2021   Major depressive disorder 12/11/2021   Rotator cuff tear arthropathy 11/15/2021   Anemia in chronic kidney disease (CKD) 10/12/2021   Dehydration 09/25/2021   CHF (congestive heart  failure) (HCC) 09/16/2021   Urinary frequency 09/01/2021   Anemia of chronic disease 08/10/2021   Generalized pain 08/04/2021   Sciatica, right side 07/19/2021   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/10/2021   Atherosclerosis of aorta (HCC) 07/10/2021   Generalized weakness 05/12/2021   Trigger finger, right 12/12/2020   Confusion 11/24/2020   Blurry vision 11/18/2020   Cervical radiculopathy 07/09/2020   Headache 02/19/2020   Tubular adenoma of colon 02/09/2019   Benign neoplasm of cerebral meninges (HCC) 11/12/2018   Benign meningioma of brain (HCC) 10/31/2018   Osteoarthritis of both hips 07/06/2018   Left arm pain 06/11/2018   Lipoma of extremity 12/31/2017   Impingement syndrome of left shoulder region 12/27/2017   Leg weakness, bilateral 10/28/2017   Lumbar spondylosis with myelopathy 10/12/2017   Numbness of hand 10/10/2017   Chronic neck pain 07/25/2017   Chronic pain syndrome 07/25/2017   Diabetic polyneuropathy associated with type 2 diabetes mellitus (HCC) 05/19/2016   Tobacco use disorder 04/24/2016   Obesity (BMI 30.0-34.9) 04/24/2016   History of palpitations 08/09/2015   Nausea 08/09/2015   Labile hypertension 08/03/2015   Normal coronary arteries 08/03/2015   Dizziness 07/15/2015   Left-sided low back pain with left-sided sciatica 06/27/2015   Type 2 diabetes mellitus with hyperglycemia, with long-term current use of insulin (HCC) 05/06/2015   Multinodular goiter 05/06/2015   Rectocele, female 04/27/2015   Anal sphincter incontinence 04/27/2015   Pelvic relaxation due to rectocele 03/30/2015   Hyperkalemia 02/22/2015   Pulmonary hypertension (HCC) 02/22/2015   Posterior chest pain 02/21/2015   AKI (acute kidney injury) (HCC) 02/21/2015   Episodic cigarette smoking dependence 01/11/2015   Migraine without aura and without status migrainosus, not intractable 07/02/2014   Microcytic anemia  02/18/2014   Hip pain, right 12/17/2013   COPD mixed type (HCC) 09/16/2013   Chronic obstructive pulmonary disease (HCC) 09/16/2013   Acquired hypothyroidism 08/16/2013   Gastroparesis 04/28/2013   Nicotine dependence 03/09/2013   Seizure disorder (HCC) 01/19/2013   Displacement of cervical intervertebral disc without myelopathy 12/13/2012   Bursitis of shoulder 12/13/2012   Cervical disc disorder with radiculopathy of cervical region 10/31/2012   Solitary pulmonary nodule 08/19/2012   Hypersomnia disorder related to a known organic factor 06/11/2012   Pruritus 04/18/2012   Meningioma (HCC) 11/19/2011   Mononeuritis leg 10/25/2011   Carpal tunnel syndrome of right wrist 05/23/2011   Chronic pain of right hand 05/04/2011   Polypharmacy 04/28/2011   Mood disorder (HCC) 04/28/2011   Constipation 04/13/2011   Recurrent falls 12/12/2010   Oropharyngeal dysphagia 07/12/2010   Urinary incontinence 12/16/2009   HEARING LOSS 10/26/2009   DMII (diabetes mellitus, type 2) (HCC) 07/07/2009   Mixed hyperlipidemia 12/11/2008   IBS 12/11/2008   GERD 07/29/2008   MILK PRODUCTS ALLERGY 07/29/2008   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/03/2007   Essential hypertension 06/27/2007   Backache 06/19/2007   Osteoporosis 06/19/2007   Obstructive sleep apnea 06/19/2007   TRIGGER FINGER 04/18/2007   DIVERTICULOSIS, COLON 11/13/2006  PCP:  Kerri Perches, MD Pharmacy:   [facility] - [place], [place] - [address] [place] [address] Phone: [phone] Fax: [phone]  [facility] - [place], [place] - [address] [place] [address] Phone: [phone] Fax: [phone]  [facility] [address] [place] - [address] [place] [address] [place] [address] [place] [address] [place] [address] Phone: [phone] Fax: [phone]     Social Determinants of Health (SDOH) Social  History: SDOH Screenings   Food Insecurity: No Food Insecurity (01/12/2023)  Housing: Patient Declined (01/12/2023)  Transportation Needs: No Transportation Needs (01/12/2023)  Utilities: Not At Risk (01/12/2023)  Alcohol Screen: Low Risk  (07/17/2022)  Depression (PHQ2-9): Low Risk  (12/12/2022)  Financial Resource Strain: Low Risk  (07/17/2022)  Physical Activity: Insufficiently Active (07/17/2022)  Social Connections: Moderately Integrated (07/17/2022)  Stress: No Stress Concern Present (07/17/2022)  Tobacco Use: Medium Risk (01/12/2023)  Health Literacy: Adequate Health Literacy (12/14/2022)   SDOH Interventions:     Readmission Risk Interventions    08/21/2022   11:54 AM 09/16/2021   10:07 AM  Readmission Risk Prevention Plan  Transportation Screening Complete Complete  Medication Review Oceanographer) Complete Complete  PCP or Specialist appointment within 3-5 days of discharge Complete   HRI or Home Care Consult Complete Complete  SW Recovery Care/Counseling Consult Complete Complete  Palliative Care Screening Not Applicable Not Applicable  Skilled Nursing Facility Not Applicable Patient Refused

## 2023-01-13 NOTE — Assessment & Plan Note (Signed)
Benefits from CPAP when used and it is the only appropriate therapy currently for her. Consider BIPAP ST/ ASV titration sleep study, but I doubt her compliance would improve without someone to attend to her.

## 2023-01-14 ENCOUNTER — Observation Stay (HOSPITAL_COMMUNITY): Payer: Medicare HMO

## 2023-01-14 DIAGNOSIS — I130 Hypertensive heart and chronic kidney disease with heart failure and stage 1 through stage 4 chronic kidney disease, or unspecified chronic kidney disease: Secondary | ICD-10-CM | POA: Diagnosis not present

## 2023-01-14 DIAGNOSIS — E875 Hyperkalemia: Secondary | ICD-10-CM | POA: Diagnosis not present

## 2023-01-14 DIAGNOSIS — G9389 Other specified disorders of brain: Secondary | ICD-10-CM | POA: Diagnosis not present

## 2023-01-14 DIAGNOSIS — G839 Paralytic syndrome, unspecified: Secondary | ICD-10-CM | POA: Diagnosis not present

## 2023-01-14 DIAGNOSIS — F17200 Nicotine dependence, unspecified, uncomplicated: Secondary | ICD-10-CM | POA: Diagnosis not present

## 2023-01-14 DIAGNOSIS — G40909 Epilepsy, unspecified, not intractable, without status epilepticus: Secondary | ICD-10-CM | POA: Diagnosis not present

## 2023-01-14 DIAGNOSIS — G9341 Metabolic encephalopathy: Secondary | ICD-10-CM | POA: Diagnosis not present

## 2023-01-14 DIAGNOSIS — N179 Acute kidney failure, unspecified: Secondary | ICD-10-CM | POA: Diagnosis not present

## 2023-01-14 DIAGNOSIS — Z86018 Personal history of other benign neoplasm: Secondary | ICD-10-CM | POA: Diagnosis not present

## 2023-01-14 DIAGNOSIS — R4182 Altered mental status, unspecified: Secondary | ICD-10-CM | POA: Diagnosis not present

## 2023-01-14 DIAGNOSIS — G928 Other toxic encephalopathy: Secondary | ICD-10-CM | POA: Diagnosis not present

## 2023-01-14 LAB — MAGNESIUM: Magnesium: 2 mg/dL (ref 1.7–2.4)

## 2023-01-14 LAB — BASIC METABOLIC PANEL
Anion gap: 8 (ref 5–15)
BUN: 19 mg/dL (ref 8–23)
CO2: 24 mmol/L (ref 22–32)
Calcium: 9.3 mg/dL (ref 8.9–10.3)
Chloride: 105 mmol/L (ref 98–111)
Creatinine, Ser: 0.88 mg/dL (ref 0.44–1.00)
GFR, Estimated: >60 mL/min (ref >60)
Glucose, Bld: 144 mg/dL — ABNORMAL HIGH (ref 70–99)
Potassium: 4.6 mmol/L (ref 3.5–5.1)
Sodium: 137 mmol/L (ref 135–145)

## 2023-01-14 LAB — GLUCOSE, CAPILLARY
Glucose-Capillary: 130 mg/dL — ABNORMAL HIGH (ref 70–99)
Glucose-Capillary: 158 mg/dL — ABNORMAL HIGH (ref 70–99)
Glucose-Capillary: 140 mg/dL — ABNORMAL HIGH (ref 70–99)
Glucose-Capillary: 111 mg/dL — ABNORMAL HIGH (ref 70–99)

## 2023-01-14 LAB — URINE CULTURE: Culture: NO GROWTH

## 2023-01-14 LAB — TROPONIN I (HIGH SENSITIVITY)
Troponin I (High Sensitivity): 4 ng/L (ref <18)
Troponin I (High Sensitivity): 4 ng/L (ref <18)

## 2023-01-14 MED ORDER — MELATONIN 3 MG PO TABS
6.0000 mg | ORAL_TABLET | Freq: Every evening | ORAL | Status: DC | PRN
  Administered 2023-01-14 – 2023-01-18 (×4): 6 mg via ORAL
  Filled 2023-01-14 (×5): qty 2

## 2023-01-14 MED ORDER — AMLODIPINE BESYLATE 5 MG PO TABS
5.0000 mg | ORAL_TABLET | Freq: Every day | ORAL | Status: DC
  Administered 2023-01-14 – 2023-01-26 (×12): 5 mg via ORAL
  Filled 2023-01-14 (×14): qty 1

## 2023-01-14 MED ORDER — HYDRALAZINE HCL 20 MG/ML IJ SOLN
10.0000 mg | Freq: Four times a day (QID) | INTRAMUSCULAR | Status: DC | PRN
Start: 2023-01-14 — End: 2023-01-27
  Administered 2023-01-14: 10 mg via INTRAVENOUS
  Filled 2023-01-14: qty 1

## 2023-01-14 NOTE — Plan of Care (Signed)
  Problem: Activity: Goal: Risk for activity intolerance will decrease Outcome: Progressing   Problem: Coping: Goal: Level of anxiety will decrease Outcome: Progressing   Problem: Safety: Goal: Ability to remain free from injury will improve Outcome: Progressing   

## 2023-01-14 NOTE — [place] FL2 (Signed)
MEDICAID FL2 LEVEL OF CARE FORM     IDENTIFICATION  Patient Name: [name] Birthdate: 1950-10-25 Sex: female Admission Date (Current Location): 01/12/2023  [facility] and [place] Number:  [employer] and Address:  [facility],  [address] [place] [address]      Provider Number: [id number]  Attending Physician Name and Address:  Catarina Hartshorn, MD  Relative Name and Phone Number:  Dallas Schimke)  [phone] [facility])    Current Level of Care: Hospital Recommended Level of Care: Skilled Nursing Facility Prior Approval Number:    Date Approved/Denied:   PASRR Number: pending  Discharge Plan: SNF    Current Diagnoses: Patient Active Problem List   Diagnosis Date Noted   Acute metabolic encephalopathy 01/12/2023   History of CVA (cerebrovascular accident) 01/12/2023   Type 2 diabetes mellitus with diabetic chronic kidney disease (HCC) 12/14/2022   Lumbar radiculopathy 12/14/2022   Ataxia 10/25/2022   Lesion of skin of scalp 10/25/2022   Seborrheic dermatitis of scalp 10/15/2022   Dermatophytosis of nail 10/09/2022   Callus of foot 10/09/2022   Neuroma of foot 10/09/2022   History of shoulder surgery 10/04/2022   H/O total shoulder replacement, left 08/18/2022   Scalp cyst 08/10/2022   Right-sided chest pain 08/06/2022   CKD (chronic kidney disease) stage 3, GFR 30-59 ml/min (HCC) 07/03/2022   Skin tag 07/03/2022   Itchy scalp 07/03/2022   Left-sided chest pain 04/27/2022   Bilateral leg edema 12/21/2021   Hypoglycemia 12/20/2021   Skin lesions, generalized 12/19/2021   Insomnia due to stress 12/19/2021   Major depressive disorder 12/11/2021   Rotator cuff tear arthropathy 11/15/2021   Anemia in chronic kidney disease (CKD) 10/12/2021   Dehydration 09/25/2021   CHF (congestive heart failure) (HCC) 09/16/2021   Urinary frequency 09/01/2021   Anemia of chronic disease 08/10/2021   Generalized pain 08/04/2021   Sciatica,  right side 07/19/2021   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/10/2021   Atherosclerosis of aorta (HCC) 07/10/2021   Generalized weakness 05/12/2021   Trigger finger, right 12/12/2020   Confusion 11/24/2020   Blurry vision 11/18/2020   Cervical radiculopathy 07/09/2020   Headache 02/19/2020   Tubular adenoma of colon 02/09/2019   Benign neoplasm of cerebral meninges (HCC) 11/12/2018   Benign meningioma of brain (HCC) 10/31/2018   Osteoarthritis of both hips 07/06/2018   Left arm pain 06/11/2018   Lipoma of extremity 12/31/2017   Impingement syndrome of left shoulder region 12/27/2017   Leg weakness, bilateral 10/28/2017   Lumbar spondylosis with myelopathy 10/12/2017   Numbness of hand 10/10/2017   Chronic neck pain 07/25/2017   Chronic pain syndrome 07/25/2017   Diabetic polyneuropathy associated with type 2 diabetes mellitus (HCC) 05/19/2016   Tobacco use disorder 04/24/2016   Obesity (BMI 30.0-34.9) 04/24/2016   History of palpitations 08/09/2015   Nausea 08/09/2015   Labile hypertension 08/03/2015   Normal coronary arteries 08/03/2015   Dizziness 07/15/2015   Left-sided low back pain with left-sided sciatica 06/27/2015   Type 2 diabetes mellitus with hyperglycemia, with long-term current use of insulin (HCC) 05/06/2015   Multinodular goiter 05/06/2015   Rectocele, female 04/27/2015   Anal sphincter incontinence 04/27/2015   Pelvic relaxation due to rectocele 03/30/2015   Hyperkalemia 02/22/2015   Pulmonary hypertension (HCC) 02/22/2015   Posterior chest pain 02/21/2015   AKI (acute kidney injury) (HCC) 02/21/2015   Episodic cigarette smoking dependence 01/11/2015   Migraine without  aura and without status migrainosus, not intractable 07/02/2014   Microcytic anemia 02/18/2014   Hip pain, right 12/17/2013   COPD mixed type (HCC) 09/16/2013   Chronic obstructive pulmonary disease (HCC) 09/16/2013   Acquired  hypothyroidism 08/16/2013   Gastroparesis 04/28/2013   Nicotine dependence 03/09/2013   Seizure disorder (HCC) 01/19/2013   Displacement of cervical intervertebral disc without myelopathy 12/13/2012   Bursitis of shoulder 12/13/2012   Cervical disc disorder with radiculopathy of cervical region 10/31/2012   Solitary pulmonary nodule 08/19/2012   Hypersomnia disorder related to a known organic factor 06/11/2012   Pruritus 04/18/2012   Meningioma (HCC) 11/19/2011   Mononeuritis leg 10/25/2011   Carpal tunnel syndrome of right wrist 05/23/2011   Chronic pain of right hand 05/04/2011   Polypharmacy 04/28/2011   Mood disorder (HCC) 04/28/2011   Constipation 04/13/2011   Recurrent falls 12/12/2010   Oropharyngeal dysphagia 07/12/2010   Urinary incontinence 12/16/2009   HEARING LOSS 10/26/2009   DMII (diabetes mellitus, type 2) (HCC) 07/07/2009   Mixed hyperlipidemia 12/11/2008   IBS 12/11/2008   GERD 07/29/2008   MILK PRODUCTS ALLERGY 07/29/2008   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/03/2007   Essential hypertension 06/27/2007   Backache 06/19/2007   Osteoporosis 06/19/2007   Obstructive sleep apnea 06/19/2007   TRIGGER FINGER 04/18/2007   DIVERTICULOSIS, COLON 11/13/2006    Orientation RESPIRATION BLADDER Height & Weight     Self, Place  Normal Incontinent, External catheter Weight: 155 lb 3.3 oz (70.4 kg) Height:  4\' 11"  (149.9 cm)  BEHAVIORAL SYMPTOMS/MOOD NEUROLOGICAL BOWEL NUTRITION STATUS      Continent Diet (heart healthy/ carb mod)  AMBULATORY STATUS COMMUNICATION OF NEEDS Skin   Limited Assist Verbally Normal                       Personal Care Assistance Level of Assistance  Bathing, Feeding, Dressing Bathing Assistance: Limited assistance Feeding assistance: Independent Dressing Assistance: Limited assistance     Functional Limitations Info  Sight, Hearing, Speech Sight Info: Impaired (glasses) Hearing Info:  Adequate Speech Info: Adequate    SPECIAL CARE FACTORS FREQUENCY  PT (By licensed PT), OT (By licensed OT)     PT Frequency: 5 x a week OT Frequency: 5 x a week            Contractures Contractures Info: Not present    Additional Factors Info  Allergies, Psychotropic Code Status Info: full Allergies Info: Iron  Milk (Cow)  Penicillins  Phenazopyridine  Cephalexin  Flonase (Fluticasone)  Milk-related Compounds Psychotropic Info: sertraline (ZOLOFT) tablet 100 mg         Current Medications (01/14/2023):  This is the current hospital active medication list Current Facility-Administered Medications  Medication Dose Route Frequency Provider Last Rate Last Admin   acetaminophen (TYLENOL) tablet 650 mg  650 mg Oral Q6H PRN Adefeso, Oladapo, DO       aspirin EC tablet 81 mg  81 mg Oral Q breakfast Adefeso, Oladapo, DO   81 mg at 01/14/23 0830   carvedilol (COREG) tablet 12.5 mg  12.5 mg Oral BID WC Adefeso, Oladapo, DO   12.5 mg at 01/14/23 0829   enoxaparin (LOVENOX) injection 40 mg  40 mg Subcutaneous Q24H Adefeso, Oladapo, DO   40 mg at 01/14/23 0829   ezetimibe (ZETIA) tablet 10 mg  10 mg Oral Daily Adefeso, Oladapo, DO   10 mg at 01/14/23 0830   insulin aspart (novoLOG) injection 0-15 Units  0-15  Units Subcutaneous TID WC Adefeso, Oladapo, DO   3 Units at 01/14/23 1211   insulin aspart (novoLOG) injection 0-5 Units  0-5 Units Subcutaneous QHS Adefeso, Oladapo, DO   2 Units at 01/12/23 2351   insulin glargine-yfgn (SEMGLEE) injection 10 Units  10 Units Subcutaneous QHS Adefeso, Oladapo, DO   10 Units at 01/13/23 2130   ipratropium-albuterol (DUONEB) 0.5-2.5 (3) MG/3ML nebulizer solution 3 mL  3 mL Nebulization Q6H PRN Adefeso, Oladapo, DO       lamoTRIgine (LAMICTAL) tablet 100 mg  100 mg Oral BID Adefeso, Oladapo, DO   100 mg at 01/14/23 0831   mirabegron ER (MYRBETRIQ) tablet 50 mg  50 mg Oral Daily Adefeso, Oladapo, DO   50 mg at 01/14/23 0828   montelukast (SINGULAIR) tablet  10 mg  10 mg Oral Daily Adefeso, Oladapo, DO   10 mg at 01/14/23 0829   ondansetron (ZOFRAN) injection 4 mg  4 mg Intravenous Q6H PRN Adefeso, Oladapo, DO   4 mg at 01/13/23 2210   polyethylene glycol (MIRALAX / GLYCOLAX) packet 17 g  17 g Oral Daily Adefeso, Oladapo, DO   17 g at 01/14/23 1610   rosuvastatin (CRESTOR) tablet 5 mg  5 mg Oral QHS Adefeso, Oladapo, DO   5 mg at 01/13/23 2132   sertraline (ZOLOFT) tablet 100 mg  100 mg Oral Daily Tat, Onalee Hua, MD   100 mg at 01/14/23 9604     Discharge Medications: Please see discharge summary for a list of discharge medications.  Relevant Imaging Results:  Relevant Lab Results:   Additional Information SSN:[id number]  Valentina Shaggy Patric Buckhalter, LCSW

## 2023-01-14 NOTE — Progress Notes (Signed)
30 Day Passar Note  RE: [name] Date of Birth: 06/23/1960 Date:01/14/2023  To Whom It May Concern:  Please be advised that the above-named patient will require a short-term nursing home stay - anticipated 30 days or less for rehabilitation and strengthening.  The plan is for return home.

## 2023-01-14 NOTE — Progress Notes (Signed)
Patient states that she is having pain in the center of her chest. Vital signs taken. MD Tat notified.

## 2023-01-14 NOTE — Progress Notes (Signed)
PROGRESS NOTE  [name] Ellefson VHQ:[id number] DOB: November 15, 1950 DOA: 01/12/2023 PCP: Kerri Perches, MD  Brief History:  72 year old female with history of diabetes mellitus type 2, hypertension, hyperlipidemia, COPD, stroke, seizure, hypothyroidism, tobacco abuse presenting with altered mental status.  The patient has private aides at home that help the patient throughout the day from 8 AM to 6 PM, 5 days a week.  Apparently, the aides have reported that the patient has been more somnolent and less interactive.  The patient has a history of urinary incontinence for which she follows urology.  Notably, the patient saw urology on 12/18/2022.  At that time, the patient's Myrbetriq dose was increased to 50 mg daily.  For her urge incontinence, UA did not suggest UTI.  Surgical options were considered, but the patient elected to proceed with expectant management.  The patient has also recently followed up with psychiatry, Dr. Tenny Craw on 01/04/2023.  During that visit, the patient's Wellbutrin was discontinued.  She was continued on Zoloft 100 mg daily, Lamictal 100 mg twice daily, Risperdal 0.5 mg at at bedtime, trazodone 150 mg at at bedtime, and Ativan 0.5 mg twice daily. The patient's niece relates that the patient has had worsening urinary incontinence and increasing difficulty maintaining her private living situation with home health aides.  At baseline, the patient states that she is able to make transfers with assistance.  She states that she is able to take a few steps with a walker.  The patient does require assistance with all her other activities of daily living.  From cognitive standpoint, the patient is  alert and oriented x 3 at baseline and able to carry on a conversation. There have been no reports of fevers, chills, chest pain, shortness breath, vomiting, diarrhea, abdominal pain.  There is urinary incontinence but no frank dysuria.  The patient denies any recent changes in her  medications.  She states that she manages her home medications. In the ED, the patient was afebrile hemodynamically stable with oxygen saturation 100% on room air.  WBC 6.2, hemoglobin 11.0, platelets 295.  Sodium 138, potassium 5.2, bicarbonate 24, serum creatinine 1.31.  AST 21, ALT 19, alkaline phosphatase 87, total bilirubin 0.4.  Corrected calcium 10.1.  VBG showed 7.3 5/50/41/27.  CT of the brain was negative for any acute findings.  CT of the cervical spine was negative for traumatic listhesis.  Chest x-ray showed basilar atelectasis.  The patient was admitted for further evaluation and treatment of her altered mental status.    Assessment/Plan: Acute metabolic encephalopathy -Multifactorial including dehydration/AKI, polypharmacy -11/3--more somnolent again today--hold d/c -MR brain -UA negative for pyuria -VBG--7.35/50/41/27 -B12--682 -TSH--0.529 -Ammonia--16 -Folic acid--13.3 -CT brain negative -Holding Lyrica and Ativan -PDMP reviewed--- hydrocodone 5/325, #15, last refill 12/14/2022; Ativan 0.5 mg, #60, last refill 01/01/2023 -Lyrica 75 mg, #90, last refill 11/17/2022 -Holding hydrocodone -d/c risperdal   Acute kidney injury -Baseline creatinine 0.8-1.0 -Presented with serum creatinine 1.31 -Continue IV fluids>> improving   Hyperkalemia -Improved with IV fluids   Controlled diabetes mellitus type 2 -11/03/2022 hemoglobin A1c 5.3 -01/13/23 A1C--5.8 -Continue NovoLog sliding scale -Reduce dose Semglee   Essential hypertension -Continue carvedilol -Holding amlodipine   Mixed hyperlipidemia -Continue statin   Tobacco abuse -Tobacco cessation discussed   Hypothyroidism -Continue Synthroid   COPD -Continue Singulair   Depression/anxiety -Continue Zoloft, Lamictal   Deconditioning -PT eval         Family Communication:  niece updated 11/3  Consultants:  none   Code Status:  FULL    DVT Prophylaxis: [place] Lovenox     Procedures: As Listed in  Progress Note Above   Antibiotics: None       Subjective: Slow to respond this am.  Denies f/c, cp, sob, n/v/d, abd pain.  Drifted to sleep with not stimulated  Objective: Vitals:   01/13/23 2022 01/14/23 0400 01/14/23 0829 01/14/23 1306  BP: (!) 140/69 (!) 157/84 (!) 163/61 (!) 159/61  Pulse: 68 81 82 80  Resp: 16 18  18   Temp: 99 F (37.2 C) 99.6 F (37.6 C)  98.9 F (37.2 C)  TempSrc:  Oral  Oral  SpO2: 99% 100%  100%  Weight:      Height:        Intake/Output Summary (Last 24 hours) at 01/14/2023 1508 Last data filed at 01/14/2023 0453 Gross per 24 hour  Intake 360 ml  Output --  Net 360 ml   Weight change:  Exam:  General:  Pt is alert, follows commands appropriately, not in acute distress HEENT: No icterus, No thrush, No neck mass, [place]/AT Cardiovascular: RRR, S1/S2, no rubs, no gallops Respiratory: CTA bilaterally, no wheezing, no crackles, no rhonchi Abdomen: Soft/+BS, non tender, non distended, no guarding Extremities: No edema, No lymphangitis, No petechiae, No rashes, no synovitis   Data Reviewed: I have personally reviewed following labs and imaging studies Basic Metabolic Panel: Recent Labs  Lab 01/12/23 1706 01/13/23 0512 01/14/23 0422  NA 138 138 137  K 5.2* 4.5 4.6  CL 106 107 105  CO2 24 23 24   GLUCOSE 240* 121* 144*  BUN 34* 26* 19  CREATININE 1.31* 1.00 0.88  CALCIUM 10.1 9.8 9.3  MG  --  1.8 2.0  PHOS  --  2.9  --    Liver Function Tests: Recent Labs  Lab 01/12/23 1706 01/13/23 0512  AST 21 18  ALT 19 16  ALKPHOS 87 72  BILITOT 0.4 0.4  PROT 8.1 7.5  ALBUMIN 4.3 4.0   No results for input(s): "LIPASE", "AMYLASE" in the last 168 hours. Recent Labs  Lab 01/12/23 1706  AMMONIA 16   Coagulation Profile: No results for input(s): "INR", "PROTIME" in the last 168 hours. CBC: Recent Labs  Lab 01/12/23 1706 01/13/23 0512  WBC 6.2 7.9  NEUTROABS 5.0  --   HGB 11.0* 10.4*  HCT 38.4 35.9*  MCV 76.5* 75.6*  PLT 295 292    Cardiac Enzymes: Recent Labs  Lab 01/13/23 1057  CKTOTAL 137   BNP: Invalid input(s): "POCBNP" CBG: Recent Labs  Lab 01/13/23 1110 01/13/23 1559 01/13/23 2024 01/14/23 0726 01/14/23 1113  GLUCAP 152* 181* 140* 130* 158*   HbA1C: Recent Labs    01/13/23 1057  HGBA1C 5.8*   Urine analysis:    Component Value Date/Time   COLORURINE YELLOW 01/12/2023 1824   APPEARANCEUR CLEAR 01/12/2023 1824   APPEARANCEUR Clear 12/18/2022 1042   LABSPEC 1.015 01/12/2023 1824   PHURINE 5.0 01/12/2023 1824   GLUCOSEU NEGATIVE 01/12/2023 1824   HGBUR NEGATIVE 01/12/2023 1824   HGBUR negative 03/29/2010 0834   BILIRUBINUR NEGATIVE 01/12/2023 1824   BILIRUBINUR Negative 12/18/2022 1042   KETONESUR NEGATIVE 01/12/2023 1824   PROTEINUR NEGATIVE 01/12/2023 1824   UROBILINOGEN 0.2 08/31/2021 1434   UROBILINOGEN 0.2 06/02/2014 0140   NITRITE NEGATIVE 01/12/2023 1824   LEUKOCYTESUR NEGATIVE 01/12/2023 1824   Sepsis Labs: @LABRCNTIP (procalcitonin:4,lacticidven:4) ) Recent Results (from the past 240 hour(s))  Urine Culture  Status: None   Collection Time: 01/12/23  9:23 PM   Specimen: Urine, Catheterized  Result Value Ref Range Status   Specimen Description   Final    URINE, CATHETERIZED Performed at [facility], [address], [place], [place] [address]    Special Requests   Final    NONE Performed at [facility], [address], [place], [place] [address]    Culture   Final    NO GROWTH Performed at [facility], [address], [place], [place] [address]    Report Status 01/14/2023 FINAL  Final     Scheduled Meds:  aspirin EC  81 mg Oral Q breakfast   carvedilol  12.5 mg Oral BID WC   enoxaparin (LOVENOX) injection  40 mg Subcutaneous Q24H   ezetimibe  10 mg Oral Daily   insulin aspart  0-15 Units Subcutaneous TID WC   insulin aspart  0-5 Units Subcutaneous QHS   insulin glargine-yfgn  10 Units Subcutaneous QHS   lamoTRIgine  100 mg Oral BID   mirabegron ER   50 mg Oral Daily   montelukast  10 mg Oral Daily   polyethylene glycol  17 g Oral Daily   rosuvastatin  5 mg Oral QHS   sertraline  100 mg Oral Daily   Continuous Infusions:  Procedures/Studies: DG Chest Portable 1 View  Result Date: 01/12/2023 CLINICAL DATA:  Altered mental status EXAM: PORTABLE CHEST 1 VIEW COMPARISON:  11/22/2022 FINDINGS: Heart size within normal limits. Low lung volumes. Mild linear right basilar atelectasis. No pleural effusion or pneumothorax. IMPRESSION: Low lung volumes with mild linear right basilar atelectasis. Electronically Signed   By: Duanne Guess D.O.   On: 01/12/2023 19:31   CT Head Wo Contrast  Result Date: 01/12/2023 CLINICAL DATA:  Head and neck trauma EXAM: CT HEAD WITHOUT CONTRAST CT CERVICAL SPINE WITHOUT CONTRAST TECHNIQUE: Multidetector CT imaging of the head and cervical spine was performed following the standard protocol without intravenous contrast. Multiplanar CT image reconstructions of the cervical spine were also generated. RADIATION DOSE REDUCTION: This exam was performed according to the departmental dose-optimization program which includes automated exposure control, adjustment of the mA and/or kV according to patient size and/or use of iterative reconstruction technique. COMPARISON:  11/22/2022 CT head, 10/26/2022 CT head and cervical spine FINDINGS: CT HEAD FINDINGS Brain: No evidence of acute infarct, hemorrhage, mass, mass effect, or midline shift. No hydrocephalus or extra-axial fluid collection. Redemonstrated encephalomalacia in the bilateral inferior frontal lobes, status post prior planum sphenoidal meningioma resection, with residual meningioma not well seen on CT. Surgical clips in the anterior cranial fossa. Vascular: No hyperdense vessel. Atherosclerotic calcifications in the intracranial carotid and vertebral arteries. Skull: Negative for fracture or focal lesion. Prior bifrontal craniotomy. Sinuses/Orbits: No acute finding. Other:  The mastoid air cells are well aerated. CT CERVICAL SPINE FINDINGS Alignment: No traumatic listhesis. Skull base and vertebrae: No acute fracture or suspicious osseous lesion. Soft tissues and spinal canal: No prevertebral fluid or swelling. No visible canal hematoma. Partially calcified nodule in the right lobe and isthmus or most recently evaluated with ultrasound on 02/02/2021. Disc levels: Degenerative changes in the cervical spine.No high-grade spinal canal stenosis. Upper chest: No focal pulmonary opacity or pleural effusion. IMPRESSION: 1. No acute intracranial process. 2. No acute fracture or traumatic listhesis in the cervical spine. 3. Redemonstrated encephalomalacia in the bilateral inferior frontal lobes, status post prior planum sphenoidal meningioma resection. Electronically Signed   By: Elaina Pattee.D.  On: 01/12/2023 19:14   CT Cervical Spine Wo Contrast  Result Date: 01/12/2023 CLINICAL DATA:  Head and neck trauma EXAM: CT HEAD WITHOUT CONTRAST CT CERVICAL SPINE WITHOUT CONTRAST TECHNIQUE: Multidetector CT imaging of the head and cervical spine was performed following the standard protocol without intravenous contrast. Multiplanar CT image reconstructions of the cervical spine were also generated. RADIATION DOSE REDUCTION: This exam was performed according to the departmental dose-optimization program which includes automated exposure control, adjustment of the mA and/or kV according to patient size and/or use of iterative reconstruction technique. COMPARISON:  11/22/2022 CT head, 10/26/2022 CT head and cervical spine FINDINGS: CT HEAD FINDINGS Brain: No evidence of acute infarct, hemorrhage, mass, mass effect, or midline shift. No hydrocephalus or extra-axial fluid collection. Redemonstrated encephalomalacia in the bilateral inferior frontal lobes, status post prior planum sphenoidal meningioma resection, with residual meningioma not well seen on CT. Surgical clips in the anterior cranial  fossa. Vascular: No hyperdense vessel. Atherosclerotic calcifications in the intracranial carotid and vertebral arteries. Skull: Negative for fracture or focal lesion. Prior bifrontal craniotomy. Sinuses/Orbits: No acute finding. Other: The mastoid air cells are well aerated. CT CERVICAL SPINE FINDINGS Alignment: No traumatic listhesis. Skull base and vertebrae: No acute fracture or suspicious osseous lesion. Soft tissues and spinal canal: No prevertebral fluid or swelling. No visible canal hematoma. Partially calcified nodule in the right lobe and isthmus or most recently evaluated with ultrasound on 02/02/2021. Disc levels: Degenerative changes in the cervical spine.No high-grade spinal canal stenosis. Upper chest: No focal pulmonary opacity or pleural effusion. IMPRESSION: 1. No acute intracranial process. 2. No acute fracture or traumatic listhesis in the cervical spine. 3. Redemonstrated encephalomalacia in the bilateral inferior frontal lobes, status post prior planum sphenoidal meningioma resection. Electronically Signed   By: Wiliam Ke M.D.   On: 01/12/2023 19:14    Catarina Hartshorn, DO  [facility]  If 7PM-7AM, please contact night-coverage [email or web address] Password [email or web address] 01/14/2023, 3:08 PM   LOS: 0 days

## 2023-01-14 NOTE — TOC Progression Note (Addendum)
Transition of Care [facility]) - Progression Note    Patient Details  Name: [name] MRN: [id number] Date of Birth: [birth year]  Transition of Care [facility]) CM/SW Contact  Larrie Kass, LCSW Phone Number: 01/14/2023, 3:37 PM  Clinical Narrative:    CSW received message from MD that pt has changed her mind about SNF. CSW spoke with pt's son Denyse Amass , he has agreed to SNF placement . CSW explained the process, pt will need insurance authorization. Pt's son would like pt's information sent out to [facility] as well. TOC to follow.   Adden 4:00pm Pt faxed out beds and PASRR pending. TOC to follow.    Expected Discharge Plan: Skilled Nursing Facility Barriers to Discharge: Continued Medical Work up  Expected Discharge Plan and Services     Post Acute Care Choice: Home Health Living arrangements for the past 2 months: Single Family Home                           HH Arranged: PT, OT, Social Work, Refused SNF HH Agency: [facility] Date [facility] Agency Contacted: 01/13/23 Time HH Agency Contacted: 1240 Representative spoke with at [facility] Agency: Cindie   Social Determinants of Health (SDOH) Interventions SDOH Screenings   Food Insecurity: No Food Insecurity (01/12/2023)  Housing: Patient Declined (01/12/2023)  Transportation Needs: No Transportation Needs (01/12/2023)  Utilities: Not At Risk (01/12/2023)  Alcohol Screen: Low Risk  (07/17/2022)  Depression (PHQ2-9): Low Risk  (12/12/2022)  Financial Resource Strain: Low Risk  (07/17/2022)  Physical Activity: Insufficiently Active (07/17/2022)  Social Connections: Moderately Integrated (07/17/2022)  Stress: No Stress Concern Present (07/17/2022)  Tobacco Use: High Risk (01/13/2023)  Health Literacy: Adequate Health Literacy (12/14/2022)    Readmission Risk Interventions    08/21/2022   11:54 AM 09/16/2021   10:07 AM  Readmission Risk Prevention Plan  Transportation Screening Complete Complete  Medication Review (RN Care  Manager) Complete Complete  PCP or Specialist appointment within 3-5 days of discharge Complete   HRI or Home Care Consult Complete Complete  SW Recovery Care/Counseling Consult Complete Complete  Palliative Care Screening Not Applicable Not Applicable  Skilled Nursing Facility Not Applicable Patient Refused

## 2023-01-14 NOTE — Progress Notes (Signed)
Responded to nursing call:  chest pain I came to bedside to eval patient  Subjective: Pt complains of SSCP for last 5-10 min.  Denies sob. Complains of nausea, no emesis.  No dizziness  Vitals:   01/14/23 1306 01/14/23 1638 01/14/23 1703 01/14/23 1726  BP: (!) 159/61 (!) 180/67 (!) 180/67 (!) 181/73  Pulse: 80 76  73  Resp: 18   (!) 24  Temp: 98.9 F (37.2 C)   98.2 F (36.8 C)  TempSrc: Oral   Oral  SpO2: 100%   100%  Weight:      Height:       CV--RRR Lung--CTA Abd--soft+BS/NT   Assessment/Plan: Atypical chest pain -reproducible on palpation -personally reviewed EKG--sinus, no concerning STT changes -cycle troponins -pt reassured -give BP meds     Catarina Hartshorn, DO [facility]

## 2023-01-14 NOTE — Progress Notes (Signed)
   01/14/23 1638  Vitals  BP (!) 180/67  Pulse Rate 76  MEWS COLOR  MEWS Score Color Green  MEWS Score  MEWS Temp 0  MEWS Systolic 0  MEWS Pulse 0  MEWS RR 0  MEWS LOC 0  MEWS Score 0   Scheduled coreg given. MD Tat notified.

## 2023-01-15 ENCOUNTER — Other Ambulatory Visit: Payer: Self-pay

## 2023-01-15 ENCOUNTER — Encounter: Payer: Self-pay | Admitting: Adult Health

## 2023-01-15 ENCOUNTER — Ambulatory Visit: Payer: Medicare HMO | Admitting: Adult Health

## 2023-01-15 ENCOUNTER — Observation Stay (HOSPITAL_COMMUNITY): Payer: Medicare HMO

## 2023-01-15 ENCOUNTER — Observation Stay (HOSPITAL_COMMUNITY)
Admit: 2023-01-15 | Discharge: 2023-01-15 | Disposition: A | Discharge status: Home / Self Care | Payer: Medicare HMO | Attending: Internal Medicine

## 2023-01-15 DIAGNOSIS — Z96612 Presence of left artificial shoulder joint: Secondary | ICD-10-CM | POA: Diagnosis not present

## 2023-01-15 DIAGNOSIS — G839 Paralytic syndrome, unspecified: Secondary | ICD-10-CM | POA: Diagnosis not present

## 2023-01-15 DIAGNOSIS — G928 Other toxic encephalopathy: Secondary | ICD-10-CM | POA: Diagnosis not present

## 2023-01-15 DIAGNOSIS — E1165 Type 2 diabetes mellitus with hyperglycemia: Secondary | ICD-10-CM | POA: Diagnosis not present

## 2023-01-15 DIAGNOSIS — R569 Unspecified convulsions: Secondary | ICD-10-CM

## 2023-01-15 DIAGNOSIS — I130 Hypertensive heart and chronic kidney disease with heart failure and stage 1 through stage 4 chronic kidney disease, or unspecified chronic kidney disease: Secondary | ICD-10-CM | POA: Diagnosis not present

## 2023-01-15 DIAGNOSIS — G40909 Epilepsy, unspecified, not intractable, without status epilepticus: Secondary | ICD-10-CM | POA: Diagnosis not present

## 2023-01-15 DIAGNOSIS — R0600 Dyspnea, unspecified: Secondary | ICD-10-CM | POA: Diagnosis not present

## 2023-01-15 DIAGNOSIS — G9341 Metabolic encephalopathy: Secondary | ICD-10-CM | POA: Diagnosis not present

## 2023-01-15 DIAGNOSIS — Z471 Aftercare following joint replacement surgery: Secondary | ICD-10-CM | POA: Diagnosis not present

## 2023-01-15 DIAGNOSIS — F17200 Nicotine dependence, unspecified, uncomplicated: Secondary | ICD-10-CM | POA: Diagnosis not present

## 2023-01-15 LAB — BASIC METABOLIC PANEL
Anion gap: 8 (ref 5–15)
BUN: 24 mg/dL — ABNORMAL HIGH (ref 8–23)
CO2: 23 mmol/L (ref 22–32)
Calcium: 9.4 mg/dL (ref 8.9–10.3)
Chloride: 105 mmol/L (ref 98–111)
Creatinine, Ser: 0.99 mg/dL (ref 0.44–1.00)
GFR, Estimated: >60 mL/min (ref >60)
Glucose, Bld: 139 mg/dL — ABNORMAL HIGH (ref 70–99)
Potassium: 3.6 mmol/L (ref 3.5–5.1)
Sodium: 136 mmol/L (ref 135–145)

## 2023-01-15 LAB — URINALYSIS, W/ REFLEX TO CULTURE (INFECTION SUSPECTED)
Bilirubin Urine: NEGATIVE
Glucose, UA: NEGATIVE mg/dL
Hgb urine dipstick: NEGATIVE
Ketones, ur: NEGATIVE mg/dL
Nitrite: NEGATIVE
Protein, ur: 30 mg/dL — AB
Specific Gravity, Urine: 1.018 (ref 1.005–1.030)
WBC, UA: >50 WBC/hpf (ref 0–5)
pH: 5 (ref 5.0–8.0)

## 2023-01-15 LAB — COMPREHENSIVE METABOLIC PANEL
ALT: 19 U/L (ref 0–44)
AST: 22 U/L (ref 15–41)
Albumin: 3.9 g/dL (ref 3.5–5.0)
Alkaline Phosphatase: 63 U/L (ref 38–126)
Anion gap: 9 (ref 5–15)
BUN: 26 mg/dL — ABNORMAL HIGH (ref 8–23)
CO2: 23 mmol/L (ref 22–32)
Calcium: 9.5 mg/dL (ref 8.9–10.3)
Chloride: 104 mmol/L (ref 98–111)
Creatinine, Ser: 1.06 mg/dL — ABNORMAL HIGH (ref 0.44–1.00)
GFR, Estimated: 56 mL/min — ABNORMAL LOW (ref >60)
Glucose, Bld: 160 mg/dL — ABNORMAL HIGH (ref 70–99)
Potassium: 4 mmol/L (ref 3.5–5.1)
Sodium: 136 mmol/L (ref 135–145)
Total Bilirubin: 0.4 mg/dL (ref <1.2)
Total Protein: 7 g/dL (ref 6.5–8.1)

## 2023-01-15 LAB — BLOOD GAS, ARTERIAL
Acid-Base Excess: 3.4 mmol/L — ABNORMAL HIGH (ref 0.0–2.0)
Bicarbonate: 28.5 mmol/L — ABNORMAL HIGH (ref 20.0–28.0)
Drawn by: 442
O2 Saturation: 99.2 %
Patient temperature: 36.5
pCO2 arterial: 44 mm[Hg] (ref 32–48)
pH, Arterial: 7.42 (ref 7.35–7.45)
pO2, Arterial: 100 mm[Hg] (ref 83–108)

## 2023-01-15 LAB — GLUCOSE, CAPILLARY
Glucose-Capillary: 107 mg/dL — ABNORMAL HIGH (ref 70–99)
Glucose-Capillary: 118 mg/dL — ABNORMAL HIGH (ref 70–99)
Glucose-Capillary: 118 mg/dL — ABNORMAL HIGH (ref 70–99)
Glucose-Capillary: 148 mg/dL — ABNORMAL HIGH (ref 70–99)
Glucose-Capillary: 167 mg/dL — ABNORMAL HIGH (ref 70–99)

## 2023-01-15 LAB — CBC
HCT: 34.2 % — ABNORMAL LOW (ref 36.0–46.0)
HCT: 33.6 % — ABNORMAL LOW (ref 36.0–46.0)
Hemoglobin: 10 g/dL — ABNORMAL LOW (ref 12.0–15.0)
Hemoglobin: 10 g/dL — ABNORMAL LOW (ref 12.0–15.0)
MCH: 21.8 pg — ABNORMAL LOW (ref 26.0–34.0)
MCH: 22.2 pg — ABNORMAL LOW (ref 26.0–34.0)
MCHC: 29.2 g/dL — ABNORMAL LOW (ref 30.0–36.0)
MCHC: 29.8 g/dL — ABNORMAL LOW (ref 30.0–36.0)
MCV: 74.7 fL — ABNORMAL LOW (ref 80.0–100.0)
MCV: 74.5 fL — ABNORMAL LOW (ref 80.0–100.0)
Platelets: 255 10*3/uL (ref 150–400)
Platelets: 263 10*3/uL (ref 150–400)
RBC: 4.58 MIL/uL (ref 3.87–5.11)
RBC: 4.51 MIL/uL (ref 3.87–5.11)
RDW: 14.8 % (ref 11.5–15.5)
RDW: 14.7 % (ref 11.5–15.5)
WBC: 6.1 10*3/uL (ref 4.0–10.5)
WBC: 6.5 10*3/uL (ref 4.0–10.5)
nRBC: 0 % (ref 0.0–0.2)
nRBC: 0 % (ref 0.0–0.2)

## 2023-01-15 LAB — PROCALCITONIN: Procalcitonin: <0.1 ng/mL

## 2023-01-15 LAB — LACTIC ACID, PLASMA
Lactic Acid, Venous: 1.1 mmol/L (ref 0.5–1.9)
Lactic Acid, Venous: 1 mmol/L (ref 0.5–1.9)

## 2023-01-15 LAB — RAPID URINE DRUG SCREEN, HOSP PERFORMED
Amphetamines: NOT DETECTED
Barbiturates: NOT DETECTED
Benzodiazepines: NOT DETECTED
Cocaine: NOT DETECTED
Opiates: NOT DETECTED
Tetrahydrocannabinol: NOT DETECTED

## 2023-01-15 LAB — AMMONIA: Ammonia: 24 umol/L (ref 9–35)

## 2023-01-15 LAB — MAGNESIUM: Magnesium: 1.9 mg/dL (ref 1.7–2.4)

## 2023-01-15 MED ORDER — LEVETIRACETAM IN NACL 500 MG/100ML IV SOLN
500.0000 mg | Freq: Two times a day (BID) | INTRAVENOUS | Status: DC
  Administered 2023-01-15 – 2023-01-16 (×2): 500 mg via INTRAVENOUS
  Filled 2023-01-15 (×2): qty 100

## 2023-01-15 MED ORDER — ACCU-CHEK SOFTCLIX LANCETS MISC: 2 refills | Status: DC

## 2023-01-15 MED ORDER — LORAZEPAM 2 MG/ML IJ SOLN: 0.5000 mg | Freq: Once | INTRAMUSCULAR | Status: DC

## 2023-01-15 MED ORDER — LEVETIRACETAM IN NACL 1000 MG/100ML IV SOLN
1000.0000 mg | Freq: Once | INTRAVENOUS | Status: AC
  Administered 2023-01-15: 1000 mg via INTRAVENOUS
  Filled 2023-01-15: qty 100

## 2023-01-15 NOTE — Procedures (Incomplete)
Patient Name: [name]  MRN: [id number]  Epilepsy Attending: Charlsie Quest  Referring Physician/Provider: Catarina Hartshorn, MD  Date: 01/15/2023 Duration: 24.13 mins  Patient history: 72yo F with ams getting eeg to evaluate for seizure  Level of alertness: awake  AEDs during EEG study: LEV, LTG  Technical aspects: This EEG study was done with scalp electrodes positioned according to the 10-20 International system of electrode placement. Electrical activity was reviewed with band pass filter of 1-70Hz , sensitivity of 7 uV/mm, display speed of 48mm/sec with a 60Hz  notched filter applied as appropriate. EEG data were recorded continuously and digitally stored.  Video monitoring was available and reviewed as appropriate.  Description: The posterior dominant rhythm consists of 8 Hz activity of moderate voltage (25-35 uV) seen predominantly in posterior head regions, symmetric and reactive to eye opening and eye closing. Hyperventilation and photic stimulation were not performed.     IMPRESSION: This study is within normal limits. No seizures or epileptiform discharges were seen throughout the recording.  A normal interictal EEG does not exclude the diagnosis of epilepsy.  Valleri Hendricksen Annabelle Harman

## 2023-01-15 NOTE — Plan of Care (Signed)
Pt restless throughout night, given prescribed Melatonin for sleep aide. Pt worried about going to [facility], declares she does not want to go there, would like to go home or to another facility. Advised pt to speak with physician in the morning for follow-up about being Dc'd to a rehab facility. Will continue to monitor pt for the remainder of nursing shift.

## 2023-01-15 NOTE — Progress Notes (Signed)
PROGRESS NOTE  [name] UJW:[id number] DOB: 1950-06-21 DOA: 01/12/2023 PCP: Kerri Perches, MD  Brief History:  72 year old female with history of diabetes mellitus type 2, hypertension, hyperlipidemia, COPD, stroke, seizure, hypothyroidism, tobacco abuse presenting with altered mental status.  The patient has private aides at home that help the patient throughout the day from 8 AM to 6 PM, 5 days a week.  Apparently, the aides have reported that the patient has been more somnolent and less interactive.  The patient has a history of urinary incontinence for which she follows urology.  Notably, the patient saw urology on 12/18/2022.  At that time, the patient's Myrbetriq dose was increased to 50 mg daily.  For her urge incontinence, UA did not suggest UTI.  Surgical options were considered, but the patient elected to proceed with expectant management.  The patient has also recently followed up with psychiatry, Dr. Tenny Craw on 01/04/2023.  During that visit, the patient's Wellbutrin was discontinued.  She was continued on Zoloft 100 mg daily, Lamictal 100 mg twice daily, Risperdal 0.5 mg at at bedtime, trazodone 150 mg at at bedtime, and Ativan 0.5 mg twice daily. The patient's niece relates that the patient has had worsening urinary incontinence and increasing difficulty maintaining her private living situation with home health aides.  At baseline, the patient states that she is able to make transfers with assistance.  She states that she is able to take a few steps with a walker.  The patient does require assistance with all her other activities of daily living.  From cognitive standpoint, the patient is  alert and oriented x 3 at baseline and able to carry on a conversation. There have been no reports of fevers, chills, chest pain, shortness breath, vomiting, diarrhea, abdominal pain.  There is urinary incontinence but no frank dysuria.  The patient denies any recent changes in her  medications.  She states that she manages her home medications. In the ED, the patient was afebrile hemodynamically stable with oxygen saturation 100% on room air.  WBC 6.2, hemoglobin 11.0, platelets 295.  Sodium 138, potassium 5.2, bicarbonate 24, serum creatinine 1.31.  AST 21, ALT 19, alkaline phosphatase 87, total bilirubin 0.4.  Corrected calcium 10.1.  VBG showed 7.3 5/50/41/27.  CT of the brain was negative for any acute findings.  CT of the cervical spine was negative for traumatic listhesis.  Chest x-ray showed basilar atelectasis.  The patient was admitted for further evaluation and treatment of her altered mental status.  The patient's Lyrica, Ativan, and Risperdal were held during the hospitalization.  Her hydrocodone was also held.  Her mental status gradually improved with subsequent days.  Initial metabolic workup was largely unremarkable.  MRI of the brain was negative for acute pathology but showed postsurgical changes reflecting previous bifrontal craniotomy for meningioma resection.  The appearance was grossly similar without acute abnormalities.  In the morning of 01/15/2023 the patient's mental status appeared near baseline.  However, the patient had been sitting up in the chair for about an hour when the patient's nurse returned to find the patient minimally responsive but awake and slump over to her right side.  Over a period of a few minutes, the patient became more awake; however, she became more agitated and confused.  She was able to follow simple one-step commands, but not answering questions appropriately.  She remained confused and agitated trying to get out of bed and interfering with medical  therapy.  CBG was in the 160s.  Vital signs were reassuring. There was concern that the patient may have had an unwitnessed seizure.  The patient was started on Keppra and EEG was ordered.    Assessment/Plan: Acute metabolic encephalopathy -Multifactorial including dehydration/AKI,  polypharmacy -11/3--more somnolent again today--hold d/c -11/3 MR brain--neg for acute finding -11/1 UA negative for pyuria -VBG--7.35/50/41/27 -B12--682 -TSH--0.529 -Ammonia--16 -Folic acid--13.3 -CT brain negative -Holding Lyrica and Ativan -PDMP reviewed--- hydrocodone 5/325, #15, last refill 12/14/2022; Ativan 0.5 mg, #60, last refill 01/01/2023 -Lyrica 75 mg, #90, last refill 11/17/2022 -Holding hydrocodone -d/c risperdal -01/15/23--pt sitting in chair; found slumped over but awake, then became agitated--suspect unwitnessed seizure  -Start Keppra -Obtain EEG -Check ABG -Repeat UA and UDS -Repeat CMP and CBC   Acute kidney injury -Baseline creatinine 0.8-1.0 -Presented with serum creatinine 1.31 -Continue IV fluids>> improving   Hyperkalemia -Improved with IV fluids   Controlled diabetes mellitus type 2 -11/03/2022 hemoglobin A1c 5.3 -01/13/23 A1C--5.8 -Continue NovoLog sliding scale -Reduce dose Semglee   Essential hypertension -Continue carvedilol -restarted amlodipine   Mixed hyperlipidemia -Continue statin   Tobacco abuse -Tobacco cessation discussed   Hypothyroidism -Continue Synthroid   COPD -Continue Singulair   Depression/anxiety -Continue Zoloft, Lamictal   Deconditioning -PT eval--recommended SNF     Family Communication:  niece updated 11/4   Consultants:  none   Code Status:  FULL    DVT Prophylaxis: [place] Lovenox     Procedures: As Listed in Progress Note Above   Antibiotics: None        Subjective: Patient is confused.  Review of systems limited.  She was agitated.  She intermittently follows one-step commands.  Objective: Vitals:   01/14/23 2000 01/15/23 0444 01/15/23 0800 01/15/23 1132  BP: (!) 142/67 (!) 141/55 (!) 164/77 (!) 134/119  Pulse: 73 76 86 83  Resp: 18 18  (!) 22  Temp: 97.9 F (36.6 C) 97.6 F (36.4 C)  97.7 F (36.5 C)  TempSrc: Oral Oral  Axillary  SpO2: 98%   97%  Weight:      Height:         Intake/Output Summary (Last 24 hours) at 01/15/2023 1228 Last data filed at 01/15/2023 0830 Gross per 24 hour  Intake 720 ml  Output 500 ml  Net 220 ml   Weight change:  Exam:  General:  Pt is alert, intermittently follows commands appropriately, agitated and confused HEENT: No icterus, No thrush, No neck mass, [place]/AT Cardiovascular: RRR, S1/S2, no rubs, no gallops Respiratory: CTA bilaterally, no wheezing, no crackles, no rhonchi Abdomen: Soft/+BS, non tender, non distended, no guarding Extremities: No edema, No lymphangitis, No petechiae, No rashes, no synovitis Neuro:  CN II-XII intact, strength 4/5 in RUE, RLE, strength 4/5 LUE, LLE; sensation intact bilateral; no dysmetria; babinski equivocal    Data Reviewed: I have personally reviewed following labs and imaging studies Basic Metabolic Panel: Recent Labs  Lab 01/12/23 1706 01/13/23 0512 01/14/23 0422 01/15/23 0458 01/15/23 1150  NA 138 138 137 136 136  K 5.2* 4.5 4.6 3.6 4.0  CL 106 107 105 105 104  CO2 24 23 24 23 23   GLUCOSE 240* 121* 144* 139* 160*  BUN 34* 26* 19 24* 26*  CREATININE 1.31* 1.00 0.88 0.99 1.06*  CALCIUM 10.1 9.8 9.3 9.4 9.5  MG  --  1.8 2.0 1.9  --   PHOS  --  2.9  --   --   --    Liver Function Tests: Recent Labs  Lab 01/12/23 1706 01/13/23 0512 01/15/23 1150  AST 21 18 22   ALT 19 16 19   ALKPHOS 87 72 63  BILITOT 0.4 0.4 0.4  PROT 8.1 7.5 7.0  ALBUMIN 4.3 4.0 3.9   No results for input(s): "LIPASE", "AMYLASE" in the last 168 hours. Recent Labs  Lab 01/12/23 1706 01/15/23 1150  AMMONIA 16 24   Coagulation Profile: No results for input(s): "INR", "PROTIME" in the last 168 hours. CBC: Recent Labs  Lab 01/12/23 1706 01/13/23 0512 01/15/23 0458 01/15/23 1150  WBC 6.2 7.9 6.1 6.5  NEUTROABS 5.0  --   --   --   HGB 11.0* 10.4* 10.0* 10.0*  HCT 38.4 35.9* 34.2* 33.6*  MCV 76.5* 75.6* 74.7* 74.5*  PLT 295 292 255 263   Cardiac Enzymes: Recent Labs  Lab 01/13/23 1057   CKTOTAL 137   BNP: Invalid input(s): "POCBNP" CBG: Recent Labs  Lab 01/14/23 1113 01/14/23 1557 01/14/23 1951 01/15/23 0740 01/15/23 1130  GLUCAP 158* 140* 111* 148* 167*   HbA1C: Recent Labs    01/13/23 1057  HGBA1C 5.8*   Urine analysis:    Component Value Date/Time   COLORURINE YELLOW 01/12/2023 1824   APPEARANCEUR CLEAR 01/12/2023 1824   APPEARANCEUR Clear 12/18/2022 1042   LABSPEC 1.015 01/12/2023 1824   PHURINE 5.0 01/12/2023 1824   GLUCOSEU NEGATIVE 01/12/2023 1824   HGBUR NEGATIVE 01/12/2023 1824   HGBUR negative 03/29/2010 0834   BILIRUBINUR NEGATIVE 01/12/2023 1824   BILIRUBINUR Negative 12/18/2022 1042   KETONESUR NEGATIVE 01/12/2023 1824   PROTEINUR NEGATIVE 01/12/2023 1824   UROBILINOGEN 0.2 08/31/2021 1434   UROBILINOGEN 0.2 06/02/2014 0140   NITRITE NEGATIVE 01/12/2023 1824   LEUKOCYTESUR NEGATIVE 01/12/2023 1824   Sepsis Labs: @LABRCNTIP (procalcitonin:4,lacticidven:4) ) Recent Results (from the past 240 hour(s))  Urine Culture     Status: None   Collection Time: 01/12/23  9:23 PM   Specimen: Urine, Catheterized  Result Value Ref Range Status   Specimen Description   Final    URINE, CATHETERIZED Performed at [facility], [address], [place], [place] [address]    Special Requests   Final    NONE Performed at [facility], [address], [place], [place] [address]    Culture   Final    NO GROWTH Performed at [facility], [address] [place], [place] [address]    Report Status 01/14/2023 FINAL  Final     Scheduled Meds:  amLODipine  5 mg Oral Daily   aspirin EC  81 mg Oral Q breakfast   carvedilol  12.5 mg Oral BID WC   enoxaparin (LOVENOX) injection  40 mg Subcutaneous Q24H   ezetimibe  10 mg Oral Daily   insulin aspart  0-15 Units Subcutaneous TID WC   insulin aspart  0-5 Units Subcutaneous QHS   insulin glargine-yfgn  10 Units Subcutaneous QHS   lamoTRIgine  100 mg Oral BID   mirabegron ER  50 mg Oral Daily    montelukast  10 mg Oral Daily   polyethylene glycol  17 g Oral Daily   rosuvastatin  5 mg Oral QHS   sertraline  100 mg Oral Daily   Continuous Infusions:  levETIRAcetam     levETIRAcetam      Procedures/Studies: MR BRAIN WO CONTRAST  Result Date: 01/14/2023 CLINICAL DATA:  Altered mental status. History of meningioma resection in 2013. EXAM: MRI HEAD WITHOUT CONTRAST TECHNIQUE: Multiplanar, multiecho pulse sequences of the brain and surrounding structures were obtained without intravenous  contrast. COMPARISON:  CT head 2 days prior, MR head 05/19/2022 FINDINGS: Brain: There is no acute intracranial hemorrhage, extra-axial fluid collection, or acute infarct. Background parenchymal volume is within expected limits for age. The ventricles are normal in size. 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 Residual meningioma was better seen on prior contrast-enhanced brain MRI but appears grossly stable, without mass effect. Additional confluent FLAIR signal abnormality in the supratentorial white matter and pons likely reflects underlying chronic small-vessel ischemic change. Punctate chronic microhemorrhages in the right frontal lobe are unchanged, nonspecific. The pituitary and suprasellar region are normal. There is no mass effect or midline shift. Vascular: Normal flow voids. Skull and upper cervical spine: Postsurgical changes as above. There is no suspicious marrow signal abnormality. Sinuses/Orbits: The paranasal sinuses are clear. Bilateral lens implants are in place. The globes and orbits are otherwise unremarkable. Other: The mastoid air cells and middle ear cavities are clear. IMPRESSION: 1. No acute intracranial pathology. 2. Postsurgical changes reflecting bifrontal craniotomy for meningioma resection with grossly similar residual compared to the prior study from 05/19/2022.  Electronically Signed   By: Lesia Hausen M.D.   On: 01/14/2023 15:53   DG Chest Portable 1 View  Result Date: 01/12/2023 CLINICAL DATA:  Altered mental status EXAM: PORTABLE CHEST 1 VIEW COMPARISON:  11/22/2022 FINDINGS: Heart size within normal limits. Low lung volumes. Mild linear right basilar atelectasis. No pleural effusion or pneumothorax. IMPRESSION: Low lung volumes with mild linear right basilar atelectasis. Electronically Signed   By: Duanne Guess D.O.   On: 01/12/2023 19:31   CT Head Wo Contrast  Result Date: 01/12/2023 CLINICAL DATA:  Head and neck trauma EXAM: CT HEAD WITHOUT CONTRAST CT CERVICAL SPINE WITHOUT CONTRAST TECHNIQUE: Multidetector CT imaging of the head and cervical spine was performed following the standard protocol without intravenous contrast. Multiplanar CT image reconstructions of the cervical spine were also generated. RADIATION DOSE REDUCTION: This exam was performed according to the departmental dose-optimization program which includes automated exposure control, adjustment of the mA and/or kV according to patient size and/or use of iterative reconstruction technique. COMPARISON:  11/22/2022 CT head, 10/26/2022 CT head and cervical spine FINDINGS: CT HEAD FINDINGS Brain: No evidence of acute infarct, hemorrhage, mass, mass effect, or midline shift. No hydrocephalus or extra-axial fluid collection. Redemonstrated encephalomalacia in the bilateral inferior frontal lobes, status post prior planum sphenoidal meningioma resection, with residual meningioma not well seen on CT. Surgical clips in the anterior cranial fossa. Vascular: No hyperdense vessel. Atherosclerotic calcifications in the intracranial carotid and vertebral arteries. Skull: Negative for fracture or focal lesion. Prior bifrontal craniotomy. Sinuses/Orbits: No acute finding. Other: The mastoid air cells are well aerated. CT CERVICAL SPINE FINDINGS Alignment: No traumatic listhesis. Skull base and vertebrae: No  acute fracture or suspicious osseous lesion. Soft tissues and spinal canal: No prevertebral fluid or swelling. No visible canal hematoma. Partially calcified nodule in the right lobe and isthmus or most recently evaluated with ultrasound on 02/02/2021. Disc levels: Degenerative changes in the cervical spine.No high-grade spinal canal stenosis. Upper chest: No focal pulmonary opacity or pleural effusion. IMPRESSION: 1. No acute intracranial process. 2. No acute fracture or traumatic listhesis in the cervical spine. 3. Redemonstrated encephalomalacia in the bilateral inferior frontal lobes, status post prior planum sphenoidal meningioma resection. Electronically Signed   By: Wiliam Ke M.D.   On: 01/12/2023 19:14   CT Cervical Spine Wo Contrast  Result  Date: 01/12/2023 CLINICAL DATA:  Head and neck trauma EXAM: CT HEAD WITHOUT CONTRAST CT CERVICAL SPINE WITHOUT CONTRAST TECHNIQUE: Multidetector CT imaging of the head and cervical spine was performed following the standard protocol without intravenous contrast. Multiplanar CT image reconstructions of the cervical spine were also generated. RADIATION DOSE REDUCTION: This exam was performed according to the departmental dose-optimization program which includes automated exposure control, adjustment of the mA and/or kV according to patient size and/or use of iterative reconstruction technique. COMPARISON:  11/22/2022 CT head, 10/26/2022 CT head and cervical spine FINDINGS: CT HEAD FINDINGS Brain: No evidence of acute infarct, hemorrhage, mass, mass effect, or midline shift. No hydrocephalus or extra-axial fluid collection. Redemonstrated encephalomalacia in the bilateral inferior frontal lobes, status post prior planum sphenoidal meningioma resection, with residual meningioma not well seen on CT. Surgical clips in the anterior cranial fossa. Vascular: No hyperdense vessel. Atherosclerotic calcifications in the intracranial carotid and vertebral arteries. Skull:  Negative for fracture or focal lesion. Prior bifrontal craniotomy. Sinuses/Orbits: No acute finding. Other: The mastoid air cells are well aerated. CT CERVICAL SPINE FINDINGS Alignment: No traumatic listhesis. Skull base and vertebrae: No acute fracture or suspicious osseous lesion. Soft tissues and spinal canal: No prevertebral fluid or swelling. No visible canal hematoma. Partially calcified nodule in the right lobe and isthmus or most recently evaluated with ultrasound on 02/02/2021. Disc levels: Degenerative changes in the cervical spine.No high-grade spinal canal stenosis. Upper chest: No focal pulmonary opacity or pleural effusion. IMPRESSION: 1. No acute intracranial process. 2. No acute fracture or traumatic listhesis in the cervical spine. 3. Redemonstrated encephalomalacia in the bilateral inferior frontal lobes, status post prior planum sphenoidal meningioma resection. Electronically Signed   By: Wiliam Ke M.D.   On: 01/12/2023 19:14    Catarina Hartshorn, DO  [facility]  If 7PM-7AM, please contact night-coverage [email or web address] Password [email or web address] 01/15/2023, 12:28 PM   LOS: 0 days

## 2023-01-15 NOTE — Progress Notes (Signed)
Patient alert with confusion, tolerated medications whole with no complaints. Patient had an episode earlier during shift see previous note, patient now more alert responding to questions being asked. Verbalized no complaints of pain at this time.

## 2023-01-15 NOTE — Progress Notes (Signed)
   01/15/23 1100  Spiritual Encounters  Type of Visit Initial  Care provided to: Pt not available  Reason for visit  (Rapid Response)  OnCall Visit No   Chaplain responded to a rapid response. No family is present. If chaplain is requested someone will respond.   Valerie Roys [facility]  [phone]

## 2023-01-15 NOTE — Progress Notes (Signed)
Noted patient lying on right side in chair, sliding out of chair, reaching out and agitated. Patient alert but non-response to command. This Clinical research associate and other staff members assisted patient back in bed, patient began pulling and repositioning self from side to side, hanging legs and arms off side of bed. Charge nurse [place] Bali RN at bedside. Rapid response called at 1128. CBG 167 mg/dL. Vital signs: BP-139/119, P-83, R-22, O2-87% on room air, placed patient on 2 liters saturation 97% on 2 liters. MD Tat at bedside. New orders placed EKG and UA obtained. Placed mittens for patient safety due to patient trying to get out of bed.

## 2023-01-15 NOTE — Progress Notes (Signed)
   01/15/23 1300  Assess: MEWS Score  Temp 98.2 F (36.8 C)  BP 107/71  MAP (mmHg) 84  Pulse Rate 85  Resp (!) 22  Level of Consciousness Alert  SpO2 100 %  Assess: MEWS Score  MEWS Temp 0  MEWS Systolic 0  MEWS Pulse 0  MEWS RR 1  MEWS LOC 0  MEWS Score 1  MEWS Score Color Green  Assess: SIRS CRITERIA  SIRS Temperature  0  SIRS Pulse 0  SIRS Respirations  1  SIRS WBC 0  SIRS Score Sum  1

## 2023-01-15 NOTE — TOC Progression Note (Signed)
Transition of Care [facility]) - Progression Note    Patient Details  Name: [name] MRN: [id number] Date of Birth: [birth year]  Transition of Care [facility]) CM/SW Contact  Villa Herb, [place] Phone Number: 01/15/2023, 12:42 PM  Clinical Narrative:    CSW spoke with pts niece who states she has been in contact with pts son and they plan to accept [facility] for SNF at this time. CSW updated Debbie in admissions at [place] of this. Insurance Berk[employer] Harvey has been started at this time. TOC to follow.   Expected Discharge Plan: Skilled Nursing Facility Barriers to Discharge: Continued Medical Work up  Expected Discharge Plan and Services     Post Acute Care Choice: Home Health Living arrangements for the past 2 months: Single Family Home                           HH Arranged: PT, OT, Social Work, Refused SNF HH Agency: [facility] Date [facility] Agency Contacted: 01/13/23 Time HH Agency Contacted: 1240 Representative spoke with at [facility] Agency: Cindie   Social Determinants of Health (SDOH) Interventions SDOH Screenings   Food Insecurity: No Food Insecurity (01/12/2023)  Housing: Patient Declined (01/12/2023)  Transportation Needs: No Transportation Needs (01/12/2023)  Utilities: Not At Risk (01/12/2023)  Alcohol Screen: Low Risk  (07/17/2022)  Depression (PHQ2-9): Low Risk  (12/12/2022)  Financial Resource Strain: Low Risk  (07/17/2022)  Physical Activity: Insufficiently Active (07/17/2022)  Social Connections: Moderately Integrated (07/17/2022)  Stress: No Stress Concern Present (07/17/2022)  Tobacco Use: High Risk (01/13/2023)  Health Literacy: Adequate Health Literacy (12/14/2022)    Readmission Risk Interventions    08/21/2022   11:54 AM 09/16/2021   10:07 AM  Readmission Risk Prevention Plan  Transportation Screening Complete Complete  Medication Review (RN Care Manager) Complete Complete  PCP or Specialist appointment within 3-5 days of discharge Complete   HRI or Home Care  Consult Complete Complete  SW Recovery Care/Counseling Consult Complete Complete  Palliative Care Screening Not Applicable Not Applicable  Skilled Nursing Facility Not Applicable Patient Refused

## 2023-01-15 NOTE — Progress Notes (Signed)
EEG complete - results pending 

## 2023-01-15 NOTE — Progress Notes (Signed)
Mobility Specialist Progress Note:   01/15/23 1015  Mobility  Activity Transferred from bed to chair  Level of Assistance Maximum assist, patient does 25-49%  Assistive Device Front wheel walker  Distance Ambulated (ft) 4 ft  Range of Motion/Exercises Active;All extremities  Activity Response Tolerated well  Mobility Referral Yes  $Mobility charge 1 Mobility  Mobility Specialist Start Time (ACUTE ONLY) 0940  Mobility Specialist Stop Time (ACUTE ONLY) 0955  Mobility Specialist Time Calculation (min) (ACUTE ONLY) 15 min   Pt received in bed, agreeable to mobility. Required MaxA to stand and transfer to chair with RW. Tolerated well, pt frustrated trying to stand up. Left pt in chair, alarm on. Call bell in reach, all needs met.   Lawerance Bach Mobility Specialist Please contact via Special educational needs teacher or  Rehab office at [phone]

## 2023-01-15 NOTE — TOC Progression Note (Signed)
Transition of Care [facility]) - Progression Note    Patient Details  Name: ELAIJAH MUNOZ MRN: [id number] Date of Birth: [birth year]  Transition of Care [facility]) CM/SW Contact  Villa Herb, [place] Phone Number: 01/15/2023, 9:58 AM  Clinical Narrative:    CSW spoke to pts son to review bed offers, he will review with family. CSW requested call back once facility choice has been made. TOC to follow.   Expected Discharge Plan: Skilled Nursing Facility Barriers to Discharge: Continued Medical Work up  Expected Discharge Plan and Services     Post Acute Care Choice: Home Health Living arrangements for the past 2 months: Single Family Home                           HH Arranged: PT, OT, Social Work, Refused SNF HH Agency: [facility] Date [facility] Agency Contacted: 01/13/23 Time HH Agency Contacted: 1240 Representative spoke with at [facility] Agency: Cindie   Social Determinants of Health (SDOH) Interventions SDOH Screenings   Food Insecurity: No Food Insecurity (01/12/2023)  Housing: Patient Declined (01/12/2023)  Transportation Needs: No Transportation Needs (01/12/2023)  Utilities: Not At Risk (01/12/2023)  Alcohol Screen: Low Risk  (07/17/2022)  Depression (PHQ2-9): Low Risk  (12/12/2022)  Financial Resource Strain: Low Risk  (07/17/2022)  Physical Activity: Insufficiently Active (07/17/2022)  Social Connections: Moderately Integrated (07/17/2022)  Stress: No Stress Concern Present (07/17/2022)  Tobacco Use: High Risk (01/13/2023)  Health Literacy: Adequate Health Literacy (12/14/2022)    Readmission Risk Interventions    08/21/2022   11:54 AM 09/16/2021   10:07 AM  Readmission Risk Prevention Plan  Transportation Screening Complete Complete  Medication Review (RN Care Manager) Complete Complete  PCP or Specialist appointment within 3-5 days of discharge Complete   HRI or Home Care Consult Complete Complete  SW Recovery Care/Counseling Consult Complete Complete  Palliative Care  Screening Not Applicable Not Applicable  Skilled Nursing Facility Not Applicable Patient Refused

## 2023-01-15 NOTE — TOC Progression Note (Addendum)
Transition of Care [facility]) - Progression Note    Patient Details  Name: [name] MRN: [id number] Date of Birth: 06-Mar-1951  Transition of Care [facility]) CM/SW Contact  Leitha Bleak, RN Phone Number: 01/15/2023, 9:00 AM  Clinical Narrative:   Uploaded requested documents to [employer].    Addendum [name] # [phone] [id number]   Expected Discharge Plan: Skilled Nursing Facility Barriers to Discharge: Continued Medical Work up  Expected Discharge Plan and Services     Post Acute Care Choice: Home Health Living arrangements for the past 2 months: Single Family Home                           HH Arranged: PT, OT, Social Work, Refused SNF HH Agency: [facility] Date [facility] Agency Contacted: 01/13/23 Time HH Agency Contacted: 1240 Representative spoke with at [facility] Agency: Cindie   Social Determinants of Health (SDOH) Interventions SDOH Screenings   Food Insecurity: No Food Insecurity (01/12/2023)  Housing: Patient Declined (01/12/2023)  Transportation Needs: No Transportation Needs (01/12/2023)  Utilities: Not At Risk (01/12/2023)  Alcohol Screen: Low Risk  (07/17/2022)  Depression (PHQ2-9): Low Risk  (12/12/2022)  Financial Resource Strain: Low Risk  (07/17/2022)  Physical Activity: Insufficiently Active (07/17/2022)  Social Connections: Moderately Integrated (07/17/2022)  Stress: No Stress Concern Present (07/17/2022)  Tobacco Use: High Risk (01/13/2023)  Health Literacy: Adequate Health Literacy (12/14/2022)    Readmission Risk Interventions    08/21/2022   11:54 AM 09/16/2021   10:07 AM  Readmission Risk Prevention Plan  Transportation Screening Complete Complete  Medication Review (RN Care Manager) Complete Complete  PCP or Specialist appointment within 3-5 days of discharge Complete   HRI or Home Care Consult Complete Complete  SW Recovery Care/Counseling Consult Complete Complete  Palliative Care Screening Not Applicable Not Applicable  Skilled Nursing Facility Not  Applicable Patient Refused

## 2023-01-15 NOTE — Progress Notes (Signed)
   01/15/23 1132  Assess: MEWS Score  Temp 97.7 F (36.5 C)  BP (!) 134/119  MAP (mmHg) 125  Pulse Rate 83  Resp (!) 22  Level of Consciousness Responds to Pain  SpO2 97 %  O2 Device Nasal Cannula  O2 Flow Rate (L/min) 2 L/min  Assess: MEWS Score  MEWS Temp 0  MEWS Systolic 0  MEWS Pulse 0  MEWS RR 1  MEWS LOC 2  MEWS Score 3  MEWS Score Color Yellow  Assess: if the MEWS score is Yellow or Red  Were vital signs accurate and taken at a resting state? Yes  Does the patient meet 2 or more of the SIRS criteria? No  MEWS guidelines implemented  Yes, yellow  Treat  MEWS Interventions Considered administering scheduled or prn medications/treatments as ordered  Take Vital Signs  Increase Vital Sign Frequency  Yellow: Q2hr x1, continue Q4hrs until patient remains green for 12hrs  Escalate  MEWS: Escalate Yellow: Discuss with charge nurse and consider notifying provider and/or RRT  Notify: Charge Nurse/RN  Name of Charge Nurse/RN Notified [place] RN  Provider Notification  Provider Name/Title MD Tat  Date Provider Notified 01/15/23  Time Provider Notified 1140  Method of Notification Face-to-face (At bedside)  Notification Reason Change in status  Provider response See new orders  Date of Provider Response 01/15/23  Time of Provider Response 1142  Notify: Rapid Response  Date Rapid Response Notified 01/15/23  Time Rapid Response Notified 1128  Assess: SIRS CRITERIA  SIRS Temperature  0  SIRS Pulse 0  SIRS Respirations  1  SIRS WBC 0  SIRS Score Sum  1     01/15/23 1132  Assess: MEWS Score  Temp 97.7 F (36.5 C)  BP (!) 134/119  MAP (mmHg) 125  Pulse Rate 83  Resp (!) 22  Level of Consciousness Responds to Pain  SpO2 97 %  O2 Device Nasal Cannula  O2 Flow Rate (L/min) 2 L/min  Assess: MEWS Score  MEWS Temp 0  MEWS Systolic 0  MEWS Pulse 0  MEWS RR 1  MEWS LOC 2  MEWS Score 3  MEWS Score Color Yellow  Assess: if the MEWS score is Yellow or Red  Were  vital signs accurate and taken at a resting state? Yes  Does the patient meet 2 or more of the SIRS criteria? No  MEWS guidelines implemented  Yes, yellow  Treat  MEWS Interventions Considered administering scheduled or prn medications/treatments as ordered  Take Vital Signs  Increase Vital Sign Frequency  Yellow: Q2hr x1, continue Q4hrs until patient remains green for 12hrs  Escalate  MEWS: Escalate Yellow: Discuss with charge nurse and consider notifying provider and/or RRT  Notify: Charge Nurse/RN  Name of Charge Nurse/RN Notified [place] RN  Provider Notification  Provider Name/Title MD Tat  Date Provider Notified 01/15/23  Time Provider Notified 1140  Method of Notification Face-to-face (At bedside)  Notification Reason Change in status  Provider response See new orders  Date of Provider Response 01/15/23  Time of Provider Response 1142  Notify: Rapid Response  Date Rapid Response Notified 01/15/23  Time Rapid Response Notified 1128  Assess: SIRS CRITERIA  SIRS Temperature  0  SIRS Pulse 0  SIRS Respirations  1  SIRS WBC 0  SIRS Score Sum  1

## 2023-01-16 ENCOUNTER — Telehealth: Payer: Self-pay | Admitting: Family Medicine

## 2023-01-16 DIAGNOSIS — G47419 Narcolepsy without cataplexy: Secondary | ICD-10-CM | POA: Diagnosis present

## 2023-01-16 DIAGNOSIS — R569 Unspecified convulsions: Secondary | ICD-10-CM | POA: Diagnosis not present

## 2023-01-16 DIAGNOSIS — I69354 Hemiplegia and hemiparesis following cerebral infarction affecting left non-dominant side: Secondary | ICD-10-CM | POA: Diagnosis not present

## 2023-01-16 DIAGNOSIS — F17200 Nicotine dependence, unspecified, uncomplicated: Secondary | ICD-10-CM | POA: Diagnosis not present

## 2023-01-16 DIAGNOSIS — E669 Obesity, unspecified: Secondary | ICD-10-CM | POA: Diagnosis present

## 2023-01-16 DIAGNOSIS — F319 Bipolar disorder, unspecified: Secondary | ICD-10-CM | POA: Diagnosis present

## 2023-01-16 DIAGNOSIS — J449 Chronic obstructive pulmonary disease, unspecified: Secondary | ICD-10-CM | POA: Diagnosis present

## 2023-01-16 DIAGNOSIS — D631 Anemia in chronic kidney disease: Secondary | ICD-10-CM | POA: Diagnosis present

## 2023-01-16 DIAGNOSIS — E875 Hyperkalemia: Secondary | ICD-10-CM | POA: Diagnosis not present

## 2023-01-16 DIAGNOSIS — E039 Hypothyroidism, unspecified: Secondary | ICD-10-CM | POA: Diagnosis present

## 2023-01-16 DIAGNOSIS — G928 Other toxic encephalopathy: Secondary | ICD-10-CM | POA: Diagnosis present

## 2023-01-16 DIAGNOSIS — J9811 Atelectasis: Secondary | ICD-10-CM | POA: Diagnosis present

## 2023-01-16 DIAGNOSIS — G40909 Epilepsy, unspecified, not intractable, without status epilepticus: Secondary | ICD-10-CM | POA: Diagnosis present

## 2023-01-16 DIAGNOSIS — G9341 Metabolic encephalopathy: Secondary | ICD-10-CM | POA: Diagnosis not present

## 2023-01-16 DIAGNOSIS — G839 Paralytic syndrome, unspecified: Secondary | ICD-10-CM | POA: Diagnosis present

## 2023-01-16 DIAGNOSIS — E1165 Type 2 diabetes mellitus with hyperglycemia: Secondary | ICD-10-CM | POA: Diagnosis not present

## 2023-01-16 DIAGNOSIS — I69323 Fluency disorder following cerebral infarction: Secondary | ICD-10-CM | POA: Diagnosis not present

## 2023-01-16 DIAGNOSIS — E860 Dehydration: Secondary | ICD-10-CM | POA: Diagnosis present

## 2023-01-16 DIAGNOSIS — I130 Hypertensive heart and chronic kidney disease with heart failure and stage 1 through stage 4 chronic kidney disease, or unspecified chronic kidney disease: Secondary | ICD-10-CM | POA: Diagnosis present

## 2023-01-16 DIAGNOSIS — E1140 Type 2 diabetes mellitus with diabetic neuropathy, unspecified: Secondary | ICD-10-CM | POA: Diagnosis present

## 2023-01-16 DIAGNOSIS — I509 Heart failure, unspecified: Secondary | ICD-10-CM | POA: Diagnosis present

## 2023-01-16 DIAGNOSIS — N179 Acute kidney failure, unspecified: Secondary | ICD-10-CM | POA: Diagnosis present

## 2023-01-16 DIAGNOSIS — E1122 Type 2 diabetes mellitus with diabetic chronic kidney disease: Secondary | ICD-10-CM | POA: Diagnosis present

## 2023-01-16 DIAGNOSIS — R350 Frequency of micturition: Secondary | ICD-10-CM | POA: Diagnosis present

## 2023-01-16 LAB — GLUCOSE, CAPILLARY
Glucose-Capillary: 102 mg/dL — ABNORMAL HIGH (ref 70–99)
Glucose-Capillary: 109 mg/dL — ABNORMAL HIGH (ref 70–99)
Glucose-Capillary: 149 mg/dL — ABNORMAL HIGH (ref 70–99)
Glucose-Capillary: 138 mg/dL — ABNORMAL HIGH (ref 70–99)

## 2023-01-16 MED ORDER — LAMOTRIGINE 25 MG PO TABS
150.0000 mg | ORAL_TABLET | Freq: Two times a day (BID) | ORAL | Status: DC
  Administered 2023-01-16 – 2023-01-26 (×20): 150 mg via ORAL
  Filled 2023-01-16 (×8): qty 2
  Filled 2023-01-16: qty 1
  Filled 2023-01-16 (×9): qty 2
  Filled 2023-01-16: qty 1
  Filled 2023-01-16 (×4): qty 2

## 2023-01-16 MED ORDER — MIDAZOLAM HCL 2 MG/2ML IJ SOLN
1.0000 mg | INTRAMUSCULAR | Status: DC | PRN
Start: 2023-01-16 — End: 2023-01-27
  Administered 2023-01-17 (×2): 1 mg via INTRAVENOUS
  Filled 2023-01-16 (×2): qty 2

## 2023-01-16 MED ORDER — AMLODIPINE BESYLATE 5 MG PO TABS: 5.0000 mg | ORAL_TABLET | Freq: Every day | ORAL | Status: DC

## 2023-01-16 MED ORDER — LAMOTRIGINE 150 MG PO TABS: 150.0000 mg | ORAL_TABLET | Freq: Two times a day (BID) | ORAL | Status: DC

## 2023-01-16 NOTE — Plan of Care (Signed)
  Problem: Education: Goal: Knowledge of General Education information will improve Description: Including pain rating scale, medication(s)/side effects and non-pharmacologic comfort measures Outcome: Progressing   Problem: Health Behavior/Discharge Planning: Goal: Ability to manage health-related needs will improve Outcome: Progressing   Problem: Clinical Measurements: Goal: Ability to maintain clinical measurements within normal limits will improve Outcome: Progressing Goal: Will remain free from infection Outcome: Progressing   Problem: Activity: Goal: Risk for activity intolerance will decrease Outcome: Progressing   

## 2023-01-16 NOTE — Telephone Encounter (Signed)
Patient called said she is in the hospital at [facility] Room 323 asking for Dr Lodema Hong to please give her a call need to speak to her about the doctor at the hospital is saying.

## 2023-01-16 NOTE — Consult Note (Addendum)
I connected with  [name] on 01/16/23 by a video enabled telemedicine application and verified that I am speaking with the correct person using two identifiers.   I discussed the limitations of evaluation and management by telemedicine. The patient expressed understanding and agreed to proceed.  Location of patient: [facility] Location of physician: [facility]   Neurology Consultation Reason for Consult: seizure Referring Physician: Dr Onalee Hua Tat  CC: ams  History is obtained from: patient, chart review  HPI: [name] is a 72 y.o. female with PMH of orbital groove meningioma s/p resection in 2013, seizures, HTN, HLD, Depression and anxiety who presented on 01/12/2023  due to worsening mental status and urinary incontinence. Urine culture was negative for UTI. There was concern for polypharmacy and therefore pregabalin and ativan were held. Per Dr Tat on 01/15/2023,  "she was at baseline sitting up in a chair. About one hour after getting her up, the nurse went back to check on the patient>>>the patient was somnolent and difficult to arouse. About 5-10 minutes later, patient became more alert but confused and agitated and combative. Dr Tat evaluated the patient and she was confused but following one step commands about 15 minutes after nurse found her." She was loaded patient with keppra and neurology consulted for further recommendations  ROS: All other systems reviewed and negative except as noted in the HPI.   Past Medical History:  Diagnosis Date   Allergy    Anemia    Anemia in chronic kidney disease (CKD) 08/10/2021   Anemia in chronic kidney disease (CKD) 10/12/2021   Anxiety    takes Ativan daily   Arthritis    Assistance needed for mobility    Bipolar disorder (HCC)    takes Risperdal nightly   Blood transfusion    Brain tumor (HCC)    Cancer (HCC)    In her gum   Carpal tunnel syndrome of right wrist 05/23/2011   Cervical disc disorder with radiculopathy of  cervical region 10/31/2012   Chronic back pain    Chronic idiopathic constipation    Chronic neck and back pain    Colon polyps    COPD (chronic obstructive pulmonary disease) with chronic bronchitis (HCC) 09/16/2013   Office Spirometry 10/30/2013-submaximal effort based on appearance of loop and curve. Numbers would fit with severe restriction but her physiologic capability may be better than this. FVC 0.91/44%, and 10.74/45%, FEV1/FVC 0.81, FEF 25-75% 1.43/69%     Diabetes mellitus    Type II   Diverticulosis    TCS 9/08 by Dr. Lina Sar for diarrhea . Bx for micro scopic colitis negative.    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   Metabolic encephalopathy 08/03/2011   Migraines    chronic headaches   Mononeuritis lower limb    Narcolepsy    Osteoporosis    Pancreatitis 2006   due to Depakote  with normal EUS    Paralysis (HCC)    Pneumonia    Schatzki's ring    non critical / EGD with ED 8/2011with RMR   Seizures (HCC)    takes Lamictal daily.Last seizure 3 yrs ago   Sleep apnea    on CPAP   Small bowel obstruction (HCC)    Stroke (HCC)    left sided weakness, speech changes   Tubular adenoma of colon     Family History  Problem Relation Age of Onset   Heart attack Mother        HTN   Pneumonia Father    Kidney failure Father    Diabetes Father    Pancreatic cancer Sister    Cancer Sister        breast    Cancer Sister        pancreatic   Diabetes Brother    Hypertension  Brother    Diabetes Brother    Alcohol abuse Maternal Uncle    Stroke Maternal Grandmother    Heart attack Maternal Grandfather    Hypertension Son    Sleep apnea Son    Colon cancer Neg Hx    Anesthesia problems Neg Hx    Hypotension Neg Hx    Malignant hyperthermia Neg Hx    Pseudochol deficiency Neg Hx    Breast cancer Neg Hx    Stomach cancer Neg Hx     Social History:  reports that she has been smoking cigarettes. She started smoking about 48 years ago. She has a 23.5 pack-year smoking history. She has been exposed to tobacco smoke. She has never used smokeless tobacco. She reports that she does not drink alcohol and does not use drugs.   Medications Prior to Admission  Medication Sig Dispense Refill Last Dose   acetaminophen (TYLENOL) 325 MG tablet Take 650 mg by mouth every 6 (six) hours as needed for mild pain, moderate pain or headache.      Alcohol Swabs (DROPSAFE ALCOHOL PREP) 70 % PADS USE TO CLEAN FINGER PRIOR TO TESTING FOR BLOOD SUGAR AS DIRECTED 300 each 3    alendronate (FOSAMAX) 70 MG tablet TAKE 1 TABLET EVERY 7 DAYS ON AN EMPTY STOMACH WITH A FULL GLASS OF WATER (Patient taking differently: Take 70 mg by mouth once a week.) 12 tablet 3    amLODipine (NORVASC) 10 MG tablet TAKE 1 TABLET EVERY DAY (NEED MD APPOINTMENT) 30 tablet 7    aspirin EC 81 MG tablet Take 1 tablet (81 mg total) by mouth daily with breakfast. 120 tablet 2    Blood Glucose Calibration (ACCU-CHEK GUIDE CONTROL) LIQD USE AS DIRECTED 1 each 0    blood glucose meter kit and supplies Dispense based on patient and insurance preference. Use up to four times daily as directed. (FOR ICD-10 E10.9, E11.9). 1 each 0    buPROPion (WELLBUTRIN XL) 150 MG 24 hr tablet Take 1 tablet (150 mg total) by mouth every morning. 90 tablet 2    carvedilol (COREG) 12.5 MG tablet Take 12.5 mg by mouth 2 (two) times daily with a meal.      Cholecalciferol (D3 PO) Take 1 tablet by mouth daily.      Continuous Blood Gluc  Sensor (FREESTYLE LIBRE 14 DAY SENSOR) MISC 1 each by Does not apply route every 14 (fourteen) days. Change every 2 weeks 2 each 11    Docusate Sodium (DSS) 100 MG CAPS Take 100 mg by mouth in the morning and at bedtime.  DROPLET PEN NEEDLES 31G X 8 MM MISC USE FOR INJECTING INSULIN 4 TIMES DAILY. 400 each 0    Elastic Bandages & Supports (ADJUSTABLE ARM SLING) MISC L arm sling 1 each 0    ezetimibe (ZETIA) 10 MG tablet TAKE 1 TABLET EVERY DAY 90 tablet 10    glucose blood (ACCU-CHEK GUIDE) test strip Use as instructed 400 strip 3    hydrochlorothiazide (MICROZIDE) 12.5 MG capsule Take 12.5 mg by mouth daily.      HYDROcodone-acetaminophen (NORCO/VICODIN) 5-325 MG tablet Take 1 tablet 3 times a day by oral route as needed for 5 days.      insulin aspart (NOVOLOG FLEXPEN) 100 UNIT/ML FlexPen Inject 5-8 Units into the skin 3 (three) times daily with meals. 30 mL 3    insulin glargine, 2 Unit Dial, (TOUJEO MAX SOLOSTAR) 300 UNIT/ML Solostar Pen Inject 20-26 Units into the skin at bedtime. (Patient taking differently: Inject 23 Units into the skin at bedtime.) 9 mL 1    ipratropium-albuterol (DUONEB) 0.5-2.5 (3) MG/3ML SOLN Take 3 mLs by nebulization every 6 (six) hours as needed. 360 mL 12    ketoconazole (NIZORAL) 2 % shampoo Apply 1 Application topically 2 (two) times a week. 120 mL 0    lamoTRIgine (LAMICTAL) 100 MG tablet Take 1 tablet (100 mg total) by mouth 2 (two) times daily. 180 tablet 2    levothyroxine (SYNTHROID) 50 MCG tablet TAKE 1 TABLET DAILY SIX DAYS A WEEK AND 1/2 TABLET ON ONE DAY A WEEK (Patient taking differently: Take 50 mcg by mouth See admin instructions. Take 50 mg by mouth for 6 days a week and 25 mg on one day a week) 85 tablet 2    LORazepam (ATIVAN) 0.5 MG tablet Take 1 tablet (0.5 mg total) by mouth 2 (two) times daily. 60 tablet 2    metoprolol tartrate (LOPRESSOR) 50 MG tablet TAKE 1 TABLET TWICE DAILY (NEED MD APPOINTMENT) (Patient taking differently: Take 50 mg by  mouth 2 (two) times daily. TAKE 1 TABLET TWICE DAILY (NEED MD APPOINTMENT)) 180 tablet 1    mirabegron ER (MYRBETRIQ) 50 MG TB24 tablet Take 1 tablet (50 mg total) by mouth daily. 30 tablet 5    Misc. Devices (MATTRESS PAD) MISC FIRM MATTRESS PAD for hospital bed x 1 1 each 0    montelukast (SINGULAIR) 10 MG tablet Take 10 mg by mouth daily.      Multiple Vitamins-Minerals (MULTIVITAMIN GUMMIES ADULT PO) Take 1 tablet by mouth daily.      nicotine (NICODERM CQ) 21 mg/24hr patch Place 1 patch (21 mg total) onto the skin daily. 28 patch 0    ondansetron (ZOFRAN) 4 MG tablet Take one tablet by mouth once daily, as needed , for nausea 30 tablet 2    potassium chloride (KLOR-CON) 10 MEQ tablet Take 1 tablet (10 mEq total) by mouth 2 (two) times daily. 30 tablet 2    pregabalin (LYRICA) 75 MG capsule Take 75 mg by mouth daily.      RABEprazole (ACIPHEX) 20 MG tablet TAKE 1 TABLET TWICE DAILY 180 tablet 0    RESTASIS 0.05 % ophthalmic emulsion Place 2 drops into both eyes daily.      risperiDONE (RISPERDAL) 0.5 MG tablet Take 1 tablet (0.5 mg total) by mouth at bedtime. 90 tablet 2    rosuvastatin (CRESTOR) 5 MG tablet TAKE 1 TABLET AT BEDTIME 90 tablet 3    sertraline (ZOLOFT) 100 MG tablet Take 1 tablet (100 mg total) by mouth  every morning. 90 tablet 3    spironolactone (ALDACTONE) 50 MG tablet TAKE 1 TABLET (50 MG TOTAL) BY MOUTH DAILY. DISCONTINUE SPIRONOLACTONE 25MG  (Patient taking differently: Take 50 mg by mouth daily. Discontinue Spironolactone 25 mg) 90 tablet 3    telmisartan (MICARDIS) 20 MG tablet Take 10 mg by mouth daily.      traZODone (DESYREL) 150 MG tablet Take 1 tablet (150 mg total) by mouth at bedtime. 90 tablet 3    UNABLE TO FIND Med Name:  G5 Mattress 1 each 0    vitamin E 180 MG (400 UNITS) capsule Take 400 Units by mouth daily.         Exam: Current vital signs: BP (!) 113/53 (BP Location: Left Arm)   Pulse 71   Temp 98.4 F (36.9 C)   Resp 19   Ht 4\' 11"  (1.499 m)    Wt 70.4 kg   SpO2 100%   BMI 31.35 kg/m  Vital signs in last 24 hours: Temp:  [98 F (36.7 C)-98.6 F (37 C)] 98.4 F (36.9 C) (11/05 1301) Pulse Rate:  [71-79] 71 (11/05 1301) Resp:  [18-20] 19 (11/05 1301) BP: (99-171)/(53-77) 113/53 (11/05 1301) SpO2:  [98 %-100 %] 100 % (11/05 1301)   Physical Exam  Constitutional: Appears well-developed and well-nourished.  Psych: Affect appropriate to situation Neuro: Mental Status: AOx2 ( not to time), no aphasia, CN 2-12 appear grossly intact, antigravity strength in all extremities without drift, FTN intact BL,  reports intact sensation to light touch, has essential and intentional tremot in right>left hand  I have reviewed labs in epic and the results pertinent to this consultation are: CBC:  Recent Labs  Lab 01/12/23 1706 01/13/23 0512 01/15/23 0458 01/15/23 1150  WBC 6.2   < > 6.1 6.5  NEUTROABS 5.0  --   --   --   HGB 11.0*   < > 10.0* 10.0*  HCT 38.4   < > 34.2* 33.6*  MCV 76.5*   < > 74.7* 74.5*  PLT 295   < > 255 263   < > = values in this interval not displayed.    Basic Metabolic Panel:  Lab Results  Component Value Date   NA 136 01/15/2023   K 4.0 01/15/2023   CO2 23 01/15/2023   GLUCOSE 160 (H) 01/15/2023   BUN 26 (H) 01/15/2023   CREATININE 1.06 (H) 01/15/2023   CALCIUM 9.5 01/15/2023   GFRNONAA 56 (L) 01/15/2023   GFRAA 72 12/11/2019   Lipid Panel:  Lab Results  Component Value Date   LDLCALC 53 06/29/2022   HgbA1c:  Lab Results  Component Value Date   HGBA1C 5.8 (H) 01/13/2023   Urine Drug Screen:     Component Value Date/Time   LABOPIA NONE DETECTED 01/15/2023 1215   COCAINSCRNUR NONE DETECTED 01/15/2023 1215   LABBENZ NONE DETECTED 01/15/2023 1215   AMPHETMU NONE DETECTED 01/15/2023 1215   THCU NONE DETECTED 01/15/2023 1215   LABBARB NONE DETECTED 01/15/2023 1215    Alcohol Level     Component Value Date/Time   ETH <10 11/09/2021 1136     I have reviewed the images obtained:  MRI  Brain wo contrast 01/14/2023: No acute intracranial pathology. Postsurgical changes reflecting bifrontal craniotomy for meningioma resection with grossly similar residual compared to the prior study from 05/19/2022.  ASSESSMENT/PLAN:72yo F with ho meningioma, seizures noted to have an episode of alteration of awareness for few minutes followed bu gradual return to baseline.   Transient  alteration of awareness - Pregabalin and ativan were held due to ams. As these meds also have anti-seizure properties, I suspect patient had breakthrough seizure. Other ddx include behavioral vs delirium  Recommendations: - Recommend increasing lamotrigine to 150mg  BID. EKG reviewed, prolonged PR interval - Ok to hold ativan for now - Consider resuming pregabalin at lower dose if tolerated - DC keppra for now to avoid polypharmacy - seizure and delirium precautions - prn ov versed for clinical seizure - f//u with [facility] neuro in 3 months - Discussed plan with patient and Dr Tat via secure chat  Thank you for allowing [name] to participate in the care of this patient. If you have any further questions, please contact  me or neurohospitalist.   Lindie Spruce [facility]ist

## 2023-01-16 NOTE — Discharge Summary (Signed)
Physician Discharge Summary   Patient: [name] MRN: [id number] DOB: 21-Sep-1950  Admit date:     01/12/2023  Discharge date: 01/17/2023  Discharge Physician: Onalee Hua Savannaha Stonerock   PCP: Kerri Perches, MD   Recommendations at discharge:   Please follow up with primary care provider within 1-2 weeks  Please repeat BMP and CBC in one week     Hospital Course: 72 year old female with history of diabetes mellitus type 2, hypertension, hyperlipidemia, COPD, stroke, seizure, hypothyroidism, tobacco abuse presenting with altered mental status.  The patient has private aides at home that help the patient throughout the day from 8 AM to 6 PM, 5 days a week.  Apparently, the aides have reported that the patient has been more somnolent and less interactive.  The patient has a history of urinary incontinence for which she follows urology.  Notably, the patient saw urology on 12/18/2022.  At that time, the patient's Myrbetriq dose was increased to 50 mg daily.  For her urge incontinence, UA did not suggest UTI.  Surgical options were considered, but the patient elected to proceed with expectant management.  The patient has also recently followed up with psychiatry, Dr. Tenny Craw on 01/04/2023.  During that visit, the patient's Wellbutrin was discontinued.  She was continued on Zoloft 100 mg daily, Lamictal 100 mg twice daily, Risperdal 0.5 mg at at bedtime, trazodone 150 mg at at bedtime, and Ativan 0.5 mg twice daily. The patient's niece relates that the patient has had worsening urinary incontinence and increasing difficulty maintaining her private living situation with home health aides.  At baseline, the patient states that she is able to make transfers with assistance.  She states that she is able to take a few steps with a walker.  The patient does require assistance with all her other activities of daily living.  From cognitive standpoint, the patient is  alert and oriented x 3 at baseline and able to carry on a  conversation. There have been no reports of fevers, chills, chest pain, shortness breath, vomiting, diarrhea, abdominal pain.  There is urinary incontinence but no frank dysuria.  The patient denies any recent changes in her medications.  She states that she manages her home medications. In the ED, the patient was afebrile hemodynamically stable with oxygen saturation 100% on room air.  WBC 6.2, hemoglobin 11.0, platelets 295.  Sodium 138, potassium 5.2, bicarbonate 24, serum creatinine 1.31.  AST 21, ALT 19, alkaline phosphatase 87, total bilirubin 0.4.  Corrected calcium 10.1.  VBG showed 7.3 5/50/41/27.  CT of the brain was negative for any acute findings.  CT of the cervical spine was negative for traumatic listhesis.  Chest x-ray showed basilar atelectasis.  The patient was admitted for further evaluation and treatment of her altered mental status.  The patient's Lyrica, Ativan, and Risperdal were held during the hospitalization.  Her hydrocodone was also held.  Her mental status gradually improved with subsequent days.  Initial metabolic workup was largely unremarkable.  MRI of the brain was negative for acute pathology but showed postsurgical changes reflecting previous bifrontal craniotomy for meningioma resection.  The appearance was grossly similar without acute abnormalities.  In the morning of 01/15/2023 the patient's mental status appeared near baseline.  However, the patient had been sitting up in the chair for about an hour when the patient's nurse returned to find the patient minimally responsive but awake and slump over to her right side.  Over a period of a few minutes, the patient  became more awake; however, she became more agitated and confused.  She was able to follow simple one-step commands, but not answering questions appropriately.  She remained confused and agitated trying to get out of bed and interfering with medical therapy.  CBG was in the 160s.  Vital signs were reassuring. There  was concern that the patient may have had an unwitnessed seizure.  The patient was started on Keppra and EEG was ordered and neurology was consulted.  EEG was neg for seizure.  Neurology recommended restart lyrica at lower dose and ultimately titrating back to home dose at some point.   Assessment and Plan: Acute metabolic encephalopathy -Multifactorial including dehydration/AKI, polypharmacy -11/3--more somnolent again today--hold d/c -11/3 MR brain--neg for acute finding -11/1 UA negative for pyuria -VBG--7.35/50/41/27 -B12--682 -TSH--0.529 -Ammonia--16 -Folic acid--13.3 -CT brain negative -Holding Lyrica and Ativan -PDMP reviewed--- hydrocodone 5/325, #15, last refill 12/14/2022; Ativan 0.5 mg, #60, last refill 01/01/2023 -Lyrica 75 mg, #90, last refill 11/17/2022 -Holding hydrocodone -d/c risperdal -01/15/23--pt sitting in chair; found slumped over but awake, then became agitated--suspect unwitnessed seizure  -appreciate neurology consult>>d/c keppra, increase lamictal to 150 mg bid -Started Keppra>>d/c per neurology -Obtain EEG--neg for seizure -Check ABG--7.42/44/100/28 on RA -Repeat UA >50 WBC, culture neg -UDS--neg -11/5 mental status back to baseline   Acute kidney injury -Baseline creatinine 0.8-1.0 -Presented with serum creatinine 1.31 -Continue IV fluids>> improving   Hyperkalemia -Improved with IV fluids   Controlled diabetes mellitus type 2 -11/03/2022 hemoglobin A1c 5.3 -01/13/23 A1C--5.8 -Continue NovoLog sliding scale -Reduce dose Semglee   Essential hypertension -Continue carvedilol -restarted amlodipine   Mixed hyperlipidemia -Continue statin   Tobacco abuse -Tobacco cessation discussed   Hypothyroidism -Continue Synthroid   COPD -Continue Singulair   Depression/anxiety -Continue Zoloft, Lamictal   Deconditioning -PT eval--recommended SNF     Consultants: neurology Procedures performed: none  Disposition: Skilled nursing facility Diet  recommendation:  Carb modified diet DISCHARGE MEDICATION: Allergies as of 01/16/2023       Reactions   Iron Itching, Nausea And Vomiting   Milk (cow) Rash, Other (See Comments)   Doesn't agree with the stomach, also   Penicillins Hives   Phenazopyridine Hives   Cephalexin Hives   Flonase [fluticasone] Other (See Comments)   "It gave me ulcers in my nose"   Milk-related Compounds Nausea Only, Other (See Comments)   Doesn't agree with the stomach        Medication List     STOP taking these medications    buPROPion 150 MG 24 hr tablet Commonly known as: Wellbutrin XL   hydrochlorothiazide 12.5 MG capsule Commonly known as: MICROZIDE   HYDROcodone-acetaminophen 5-325 MG tablet Commonly known as: NORCO/VICODIN   LORazepam 0.5 MG tablet Commonly known as: ATIVAN   metoprolol tartrate 50 MG tablet Commonly known as: LOPRESSOR   nicotine 21 mg/24hr patch Commonly known as: Nicoderm CQ   potassium chloride 10 MEQ tablet Commonly known as: KLOR-CON   spironolactone 50 MG tablet Commonly known as: ALDACTONE   telmisartan 20 MG tablet Commonly known as: MICARDIS       TAKE these medications    Accu-Chek Guide Control Liqd USE AS DIRECTED   Accu-Chek Guide test strip Generic drug: glucose blood Use as instructed   Accu-Chek Softclix Lancets lancets TEST BLOOD SUGAR THREE TIMES DAILY AS DIRECTED   acetaminophen 325 MG tablet Commonly known as: TYLENOL Take 650 mg by mouth every 6 (six) hours as needed for mild pain, moderate pain or headache.   Adjustable  Arm Sling Misc L arm sling   alendronate 70 MG tablet Commonly known as: FOSAMAX TAKE 1 TABLET EVERY 7 DAYS ON AN EMPTY STOMACH WITH A FULL GLASS OF WATER What changed: See the new instructions.   amLODipine 5 MG tablet Commonly known as: NORVASC Take 1 tablet (5 mg total) by mouth daily. Start taking on: January 17, 2023 What changed:  medication strength See the new instructions.   aspirin  EC 81 MG tablet Take 1 tablet (81 mg total) by mouth daily with breakfast.   blood glucose meter kit and supplies Dispense based on patient and insurance preference. Use up to four times daily as directed. (FOR ICD-10 E10.9, E11.9).   carvedilol 12.5 MG tablet Commonly known as: COREG Take 12.5 mg by mouth 2 (two) times daily with a meal.   D3 PO Take 1 tablet by mouth daily.   Droplet Pen Needles 31G X 8 MM Misc Generic drug: Insulin Pen Needle USE FOR INJECTING INSULIN 4 TIMES DAILY.   DropSafe Alcohol Prep 70 % Pads USE TO CLEAN FINGER PRIOR TO TESTING FOR BLOOD SUGAR AS DIRECTED   DSS 100 MG Caps Take 100 mg by mouth in the morning and at bedtime.   ezetimibe 10 MG tablet Commonly known as: ZETIA TAKE 1 TABLET EVERY DAY   FreeStyle Libre 14 Day Sensor Misc 1 each by Does not apply route every 14 (fourteen) days. Change every 2 weeks   ipratropium-albuterol 0.5-2.5 (3) MG/3ML Soln Commonly known as: DUONEB Take 3 mLs by nebulization every 6 (six) hours as needed.   ketoconazole 2 % shampoo Commonly known as: NIZORAL Apply 1 Application topically 2 (two) times a week.   lamoTRIgine 150 MG tablet Commonly known as: LAMICTAL Take 1 tablet (150 mg total) by mouth 2 (two) times daily. What changed:  medication strength how much to take   levothyroxine 50 MCG tablet Commonly known as: SYNTHROID TAKE 1 TABLET DAILY SIX DAYS A WEEK AND 1/2 TABLET ON ONE DAY A WEEK What changed: See the new instructions.   Mattress Pad Misc FIRM MATTRESS PAD for hospital bed x 1   mirabegron ER 50 MG Tb24 tablet Commonly known as: MYRBETRIQ Take 1 tablet (50 mg total) by mouth daily.   montelukast 10 MG tablet Commonly known as: SINGULAIR Take 10 mg by mouth daily.   MULTIVITAMIN GUMMIES ADULT PO Take 1 tablet by mouth daily.   NovoLOG FlexPen 100 UNIT/ML FlexPen Generic drug: insulin aspart Inject 5-8 Units into the skin 3 (three) times daily with meals.   ondansetron 4  MG tablet Commonly known as: Zofran Take one tablet by mouth once daily, as needed , for nausea   pregabalin 75 MG capsule Commonly known as: LYRICA Take 75 mg by mouth daily.   RABEprazole 20 MG tablet Commonly known as: ACIPHEX TAKE 1 TABLET TWICE DAILY   Restasis 0.05 % ophthalmic emulsion Generic drug: cycloSPORINE Place 2 drops into both eyes daily.   risperiDONE 0.5 MG tablet Commonly known as: RISPERDAL Take 1 tablet (0.5 mg total) by mouth at bedtime.   rosuvastatin 5 MG tablet Commonly known as: CRESTOR TAKE 1 TABLET AT BEDTIME   sertraline 100 MG tablet Commonly known as: ZOLOFT Take 1 tablet (100 mg total) by mouth every morning.   Toujeo Max SoloStar 300 UNIT/ML Solostar Pen Generic drug: insulin glargine (2 Unit Dial) Inject 20-26 Units into the skin at bedtime. What changed: how much to take   traZODone 150 MG tablet Commonly known  as: DESYREL Take 1 tablet (150 mg total) by mouth at bedtime.   UNABLE TO FIND Med Name:  G5 Mattress   vitamin E 180 MG (400 UNITS) capsule Take 400 Units by mouth daily.        Contact information for follow-up providers     [facility], [facility] Follow up.   Specialty: Home Health Services Why: You were referred to [facility] for Physical Therapy, Occupational Therapy, and Social Work services. A representative will reach out to you within 24-48 hours after discharge. Contact information: [address] [place] [address] [phone]              Contact information for after-discharge care     Destination     [facility] .   Service: Skilled Nursing Contact information: [address] [place] [address] [phone]                    Discharge Exam: Ceasar Mons Weights   01/12/23 1638 01/12/23 2200  Weight: 73.5 kg 70.4 kg   HEENT:  [place]/AT, No thrush, no icterus CV:  RRR, no rub, no S3, no S4 Lung:   CTA, no wheeze, no rhonchi Abd:  soft/+BS, NT Ext:  No edema, no lymphangitis, no synovitis, no rash   Condition at discharge: stable  The results of significant diagnostics from this hospitalization (including imaging, microbiology, ancillary and laboratory) are listed below for reference.   Imaging Studies: DG CHEST PORT 1 VIEW  Result Date: 01/15/2023 CLINICAL DATA:  Dyspnea. EXAM: PORTABLE CHEST 1 VIEW COMPARISON:  January 12, 2023. FINDINGS: Patient is rotated to the right. The heart size and mediastinal contours are within normal limits. Both lungs are clear. Status post left shoulder arthroplasty. IMPRESSION: No active disease. Electronically Signed   By: Lupita Raider M.D.   On: 01/15/2023 14:47   EEG adult  Result Date: 01/15/2023 Charlsie Quest, MD     01/16/2023  8:39 AM Patient Name: [name] MRN: [id number] Epilepsy Attending: Charlsie Quest Referring Physician/Provider: Catarina Hartshorn, MD Date: 01/15/2023 Duration: 24.13 mins Patient history: 72yo F with ams getting eeg to evaluate for seizure Level of alertness: awake AEDs during EEG study: LEV, LTG Technical aspects: This EEG study was done with scalp electrodes positioned according to the 10-20 International system of electrode placement. Electrical activity was reviewed with band pass filter of 1-70Hz , sensitivity of 7 uV/mm, display speed of 55mm/sec with a 60Hz  notched filter applied as appropriate. EEG data were recorded continuously and digitally stored.  Video monitoring was available and reviewed as appropriate. Description: The posterior dominant rhythm consists of 8 Hz activity of moderate voltage (25-35 uV) seen predominantly in posterior head regions, symmetric and reactive to eye opening and eye closing. Hyperventilation and photic stimulation were not performed.   IMPRESSION: This study is within normal limits. No seizures or epileptiform discharges were seen throughout the recording. A normal interictal EEG does  not exclude the diagnosis of epilepsy. Charlsie Quest   MR BRAIN WO CONTRAST  Result Date: 01/14/2023 CLINICAL DATA:  Altered mental status. History of meningioma resection in 2013. EXAM: MRI HEAD WITHOUT CONTRAST TECHNIQUE: Multiplanar, multiecho pulse sequences of the brain and surrounding structures were obtained without intravenous contrast. COMPARISON:  CT head 2 days prior, MR head 05/19/2022 FINDINGS: Brain: There is no acute intracranial hemorrhage, extra-axial fluid collection, or acute infarct. Background parenchymal volume is within expected  limits for age. The ventricles are normal in size. 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 Residual meningioma was better seen on prior contrast-enhanced brain MRI but appears grossly stable, without mass effect. Additional confluent FLAIR signal abnormality in the supratentorial white matter and pons likely reflects underlying chronic small-vessel ischemic change. Punctate chronic microhemorrhages in the right frontal lobe are unchanged, nonspecific. The pituitary and suprasellar region are normal. There is no mass effect or midline shift. Vascular: Normal flow voids. Skull and upper cervical spine: Postsurgical changes as above. There is no suspicious marrow signal abnormality. Sinuses/Orbits: The paranasal sinuses are clear. Bilateral lens implants are in place. The globes and orbits are otherwise unremarkable. Other: The mastoid air cells and middle ear cavities are clear. IMPRESSION: 1. No acute intracranial pathology. 2. Postsurgical changes reflecting bifrontal craniotomy for meningioma resection with grossly similar residual compared to the prior study from 05/19/2022. Electronically Signed   By: Lesia Hausen M.D.   On: 01/14/2023 15:53   DG Chest Portable 1 View  Result Date: 01/12/2023 CLINICAL DATA:  Altered mental status EXAM:  PORTABLE CHEST 1 VIEW COMPARISON:  11/22/2022 FINDINGS: Heart size within normal limits. Low lung volumes. Mild linear right basilar atelectasis. No pleural effusion or pneumothorax. IMPRESSION: Low lung volumes with mild linear right basilar atelectasis. Electronically Signed   By: Duanne Guess D.O.   On: 01/12/2023 19:31   CT Head Wo Contrast  Result Date: 01/12/2023 CLINICAL DATA:  Head and neck trauma EXAM: CT HEAD WITHOUT CONTRAST CT CERVICAL SPINE WITHOUT CONTRAST TECHNIQUE: Multidetector CT imaging of the head and cervical spine was performed following the standard protocol without intravenous contrast. Multiplanar CT image reconstructions of the cervical spine were also generated. RADIATION DOSE REDUCTION: This exam was performed according to the departmental dose-optimization program which includes automated exposure control, adjustment of the mA and/or kV according to patient size and/or use of iterative reconstruction technique. COMPARISON:  11/22/2022 CT head, 10/26/2022 CT head and cervical spine FINDINGS: CT HEAD FINDINGS Brain: No evidence of acute infarct, hemorrhage, mass, mass effect, or midline shift. No hydrocephalus or extra-axial fluid collection. Redemonstrated encephalomalacia in the bilateral inferior frontal lobes, status post prior planum sphenoidal meningioma resection, with residual meningioma not well seen on CT. Surgical clips in the anterior cranial fossa. Vascular: No hyperdense vessel. Atherosclerotic calcifications in the intracranial carotid and vertebral arteries. Skull: Negative for fracture or focal lesion. Prior bifrontal craniotomy. Sinuses/Orbits: No acute finding. Other: The mastoid air cells are well aerated. CT CERVICAL SPINE FINDINGS Alignment: No traumatic listhesis. Skull base and vertebrae: No acute fracture or suspicious osseous lesion. Soft tissues and spinal canal: No prevertebral fluid or swelling. No visible canal hematoma. Partially calcified nodule in  the right lobe and isthmus or most recently evaluated with ultrasound on 02/02/2021. Disc levels: Degenerative changes in the cervical spine.No high-grade spinal canal stenosis. Upper chest: No focal pulmonary opacity or pleural effusion. IMPRESSION: 1. No acute intracranial process. 2. No acute fracture or traumatic listhesis in the cervical spine. 3. Redemonstrated encephalomalacia in the bilateral inferior frontal lobes, status post prior planum sphenoidal meningioma resection. Electronically Signed   By: Wiliam Ke M.D.   On: 01/12/2023 19:14   CT Cervical Spine Wo Contrast  Result Date: 01/12/2023 CLINICAL DATA:  Head and neck trauma EXAM: CT HEAD WITHOUT CONTRAST CT CERVICAL SPINE WITHOUT CONTRAST TECHNIQUE: Multidetector CT imaging of the head and cervical spine was performed  following the standard protocol without intravenous contrast. Multiplanar CT image reconstructions of the cervical spine were also generated. RADIATION DOSE REDUCTION: This exam was performed according to the departmental dose-optimization program which includes automated exposure control, adjustment of the mA and/or kV according to patient size and/or use of iterative reconstruction technique. COMPARISON:  11/22/2022 CT head, 10/26/2022 CT head and cervical spine FINDINGS: CT HEAD FINDINGS Brain: No evidence of acute infarct, hemorrhage, mass, mass effect, or midline shift. No hydrocephalus or extra-axial fluid collection. Redemonstrated encephalomalacia in the bilateral inferior frontal lobes, status post prior planum sphenoidal meningioma resection, with residual meningioma not well seen on CT. Surgical clips in the anterior cranial fossa. Vascular: No hyperdense vessel. Atherosclerotic calcifications in the intracranial carotid and vertebral arteries. Skull: Negative for fracture or focal lesion. Prior bifrontal craniotomy. Sinuses/Orbits: No acute finding. Other: The mastoid air cells are well aerated. CT CERVICAL SPINE  FINDINGS Alignment: No traumatic listhesis. Skull base and vertebrae: No acute fracture or suspicious osseous lesion. Soft tissues and spinal canal: No prevertebral fluid or swelling. No visible canal hematoma. Partially calcified nodule in the right lobe and isthmus or most recently evaluated with ultrasound on 02/02/2021. Disc levels: Degenerative changes in the cervical spine.No high-grade spinal canal stenosis. Upper chest: No focal pulmonary opacity or pleural effusion. IMPRESSION: 1. No acute intracranial process. 2. No acute fracture or traumatic listhesis in the cervical spine. 3. Redemonstrated encephalomalacia in the bilateral inferior frontal lobes, status post prior planum sphenoidal meningioma resection. Electronically Signed   By: Wiliam Ke M.D.   On: 01/12/2023 19:14    Microbiology: Results for orders placed or performed during the hospital encounter of 01/12/23  Urine Culture     Status: None   Collection Time: 01/12/23  9:23 PM   Specimen: Urine, Catheterized  Result Value Ref Range Status   Specimen Description   Final    URINE, CATHETERIZED Performed at [facility], [address], [place], [place] [address]    Special Requests   Final    NONE Performed at [facility], [address], [place], [place] [address]    Culture   Final    NO GROWTH Performed at [facility], [address] [place], [place] [address]    Report Status 01/14/2023 FINAL  Final   *Note: Due to a large number of results and/or encounters for the requested time period, some results have not been displayed. A complete set of results can be found in Results Review.    Labs: CBC: Recent Labs  Lab 01/12/23 1706 01/13/23 0512 01/15/23 0458 01/15/23 1150  WBC 6.2 7.9 6.1 6.5  NEUTROABS 5.0  --   --   --   HGB 11.0* 10.4* 10.0* 10.0*  HCT 38.4 35.9* 34.2* 33.6*  MCV 76.5* 75.6* 74.7* 74.5*  PLT 295 292 255 263   Basic Metabolic Panel: Recent Labs  Lab 01/12/23 1706 01/13/23 0512  01/14/23 0422 01/15/23 0458 01/15/23 1150  NA 138 138 137 136 136  K 5.2* 4.5 4.6 3.6 4.0  CL 106 107 105 105 104  CO2 24 23 24 23 23   GLUCOSE 240* 121* 144* 139* 160*  BUN 34* 26* 19 24* 26*  CREATININE 1.31* 1.00 0.88 0.99 1.06*  CALCIUM 10.1 9.8 9.3 9.4 9.5  MG  --  1.8 2.0 1.9  --   PHOS  --  2.9  --   --   --    Liver Function Tests: Recent Labs  Lab 01/12/23 1706  01/13/23 0512 01/15/23 1150  AST 21 18 22   ALT 19 16 19   ALKPHOS 87 72 63  BILITOT 0.4 0.4 0.4  PROT 8.1 7.5 7.0  ALBUMIN 4.3 4.0 3.9   CBG: Recent Labs  Lab 01/15/23 2103 01/15/23 2350 01/16/23 0715 01/16/23 1143 01/16/23 1645  GLUCAP 118* 107* 102* 109* 149*    Discharge time spent: greater than 30 minutes.  Signed: Catarina Hartshorn, MD [facility] 01/16/2023

## 2023-01-16 NOTE — TOC Progression Note (Signed)
Transition of Care [facility]) - Progression Note    Patient Details  Name: [name] MRN: [id number] Date of Birth: [birth year]  Transition of Care [facility]) CM/SW Contact  Villa Herb, [place] Phone Number: 01/16/2023, 2:13 PM  Clinical Narrative:    CSW spoke with Eunice Blase at [facility] who states that they do not have a bed today for pt but will be able to accept pt tomorrow. CSW updated MD of this. Insurance auth still good at this time. TOC to follow.   Expected Discharge Plan: Skilled Nursing Facility Barriers to Discharge: Continued Medical Work up  Expected Discharge Plan and Services     Post Acute Care Choice: Home Health Living arrangements for the past 2 months: Single Family Home                           HH Arranged: PT, OT, Social Work, Refused SNF HH Agency: [facility] Date [facility] Agency Contacted: 01/13/23 Time HH Agency Contacted: [phone] Representative spoke with at [facility] Agency: Cindie   Social Determinants of Health (SDOH) Interventions SDOH Screenings   Food Insecurity: No Food Insecurity (01/12/2023)  Housing: Patient Declined (01/12/2023)  Transportation Needs: No Transportation Needs (01/12/2023)  Utilities: Not At Risk (01/12/2023)  Alcohol Screen: Low Risk  (07/17/2022)  Depression (PHQ2-9): Low Risk  (12/12/2022)  Financial Resource Strain: Low Risk  (07/17/2022)  Physical Activity: Insufficiently Active (07/17/2022)  Social Connections: Moderately Integrated (07/17/2022)  Stress: No Stress Concern Present (07/17/2022)  Tobacco Use: High Risk (01/13/2023)  Health Literacy: Adequate Health Literacy (12/14/2022)    Readmission Risk Interventions    08/21/2022   11:54 AM 09/16/2021   10:07 AM  Readmission Risk Prevention Plan  Transportation Screening Complete Complete  Medication Review (RN Care Manager) Complete Complete  PCP or Specialist appointment within 3-5 days of discharge Complete   HRI or Home Care Consult Complete Complete  SW Recovery  Care/Counseling Consult Complete Complete  Palliative Care Screening Not Applicable Not Applicable  Skilled Nursing Facility Not Applicable Patient Refused

## 2023-01-16 NOTE — Plan of Care (Signed)
Pt progressing. Pt requested pain meds once during the night for pain. Pt is currently sleeping. Rise and fall of chest noted.

## 2023-01-16 NOTE — Progress Notes (Signed)
Physical Therapy Treatment Patient Details Name: [name] MRN: [id number] DOB: 1950/04/27 Today's Date: 01/16/2023   History of Present Illness a 72 y.o. female with medical history significant of hypertension, hyperlipidemia, GERD, COPD, hypothyroidism, seizures, paralysis, stroke who presents to the emergency department due to altered mental status.  Patient was unable to provide a history possibly due to current altered mental status, history was obtained from ED PA and medical record.  Per report, patient has nurse aides at her home and she was reported to have had increased urinary frequency since past several days and has not been acting like herself.  Patient was also noted to have different pills for different days in her pill dispenser, it is unknown what RMG's patient's medication as the nurse aide states that patient was only assisted with ADLs without RN to arrange for medications.  Regarding the urinary frequency, daughter (by phone) endorsed that patient has had incontinence since June of this year and usually wets her diapers per ED PA.  At baseline, patient ambulates with cane and walker  No report of fever, chills, headache, chest pain, shortness of breath, nausea or vomiting.    PT Comments  Pt was limited in today's treatment session, due to hesitancy with falls and reports of dizziness.' Today's session addressed progressing independence with therapeutic activity. Pt noted with continued deficits in functional bed mobility and OOB transfers due to muscle weakness. Pt was limited today due to increased hesitancy. Throughout session noted increased time to follow commands and consistent tactile cues, limited proprioception, in part due to cognitive status as pt remained lethargic throughout session. MD entered room as therapist assisting pt back to bed and reporting that she is content with going two weeks to rehab after DC from hospital. Pt would continue to benefit from skilled acute  physical therapy services in order to progress toward POC goals, safety/independence with functional mobility and QOL.     If plan is discharge home, recommend the following: A lot of help with walking and/or transfers;A lot of help with bathing/dressing/bathroom   Can travel by private vehicle        Equipment Recommendations       Recommendations for Other Services OT consult     Precautions / Restrictions Precautions Precautions: Fall Restrictions Weight Bearing Restrictions: No     Mobility  Bed Mobility Overal bed mobility: Needs Assistance Bed Mobility: Supine to Sit, Sit to Supine     Supine to sit: Min assist Sit to supine: Min assist   General bed mobility comments: Min assist this sessin with trunk elevation and tactile cues for proper UE management for powerup to into sitting. Mod assist for anterior scooting with tactile and verbal cues for lateral trunk shift and scooting opposite hip anteriorly. Min assist sitting to supine with cues for lateral trunk to elevated HOB and intermittent assist at single LE via tactile cues for proper bed positioning. mod assist for superior scooting in bed for proper supine positioning. Slow and labored with consistent vebral cues, delayed response.    Transfers     Transfers: Sit to/from Stand             General transfer comment: Pt requesting termination for OOB due to hesitancy with falling. Pt encouraged and motivated to trust therapist, pt continued to requestion termination. Assisted back to bed. Performed sitting balance and BLE strengthening.    Ambulation/Gait  Stairs             Wheelchair Mobility     Tilt Bed    Modified Rankin (Stroke Patients Only)       Balance Overall balance assessment: Needs assistance Sitting-balance support: No upper extremity supported Sitting balance-Leahy Scale: Fair Sitting balance - Comments: fair/fair sitting EOB this session with min  assist during lateral weight shifts to maintain balance. Sitting balance for 3-5 mins while performing BLE therapeutic exericse.                                    Cognition Arousal: Lethargic   Overall Cognitive Status: Within Functional Limits for tasks assessed                                          Exercises General Exercises - Lower Extremity Long Arc Quad: AROM, Both, Strengthening, 10 reps    General Comments        Pertinent Vitals/Pain Pain Assessment Pain Assessment: Faces Faces Pain Scale: No hurt    Home Living                          Prior Function            PT Goals (current goals can now be found in the care plan section) Acute Rehab PT Goals Patient Stated Goal: Go home PT Goal Formulation: With patient Time For Goal Achievement: 01/27/23 Potential to Achieve Goals: Good Progress towards PT goals: Progressing toward goals    Frequency    Min 3X/week      PT Plan      Co-evaluation              AM-PAC PT "6 Clicks" Mobility   Outcome Measure  Help needed turning from your back to your side while in a flat bed without using bedrails?: A Little Help needed moving from lying on your back to sitting on the side of a flat bed without using bedrails?: A Little Help needed moving to and from a bed to a chair (including a wheelchair)?: A Little Help needed standing up from a chair using your arms (e.g., wheelchair or bedside chair)?: A Lot Help needed to walk in hospital room?: A Lot Help needed climbing 3-5 steps with a railing? : A Lot 6 Click Score: 15    End of Session Equipment Utilized During Treatment: Gait belt Activity Tolerance: Patient tolerated treatment well;Patient limited by fatigue Patient left: in bed;with bed alarm set;with family/visitor present Nurse Communication: Mobility status PT Visit Diagnosis: Unsteadiness on feet (R26.81);Muscle weakness (generalized) (M62.81)      Time: 1610-9604 PT Time Calculation (min) (ACUTE ONLY): 17 min  Charges:    $Therapeutic Activity: 8-22 mins PT General Charges $$ ACUTE PT VISIT: 1 Visit                     Nelida Meuse PT, DPT Physical Therapist with [name] [phone] office   Nelida Meuse 01/16/2023, 11:10 AM

## 2023-01-17 ENCOUNTER — Other Ambulatory Visit: Payer: Self-pay

## 2023-01-17 DIAGNOSIS — R569 Unspecified convulsions: Secondary | ICD-10-CM | POA: Diagnosis not present

## 2023-01-17 DIAGNOSIS — G40909 Epilepsy, unspecified, not intractable, without status epilepticus: Secondary | ICD-10-CM | POA: Diagnosis not present

## 2023-01-17 DIAGNOSIS — G9341 Metabolic encephalopathy: Secondary | ICD-10-CM | POA: Diagnosis not present

## 2023-01-17 DIAGNOSIS — F17200 Nicotine dependence, unspecified, uncomplicated: Secondary | ICD-10-CM | POA: Diagnosis not present

## 2023-01-17 DIAGNOSIS — E1165 Type 2 diabetes mellitus with hyperglycemia: Secondary | ICD-10-CM | POA: Diagnosis not present

## 2023-01-17 LAB — COMPREHENSIVE METABOLIC PANEL
ALT: 19 U/L (ref 0–44)
AST: 26 U/L (ref 15–41)
Albumin: 3.7 g/dL (ref 3.5–5.0)
Alkaline Phosphatase: 69 U/L (ref 38–126)
Anion gap: 11 (ref 5–15)
BUN: 28 mg/dL — ABNORMAL HIGH (ref 8–23)
CO2: 23 mmol/L (ref 22–32)
Calcium: 9.2 mg/dL (ref 8.9–10.3)
Chloride: 103 mmol/L (ref 98–111)
Creatinine, Ser: 1.16 mg/dL — ABNORMAL HIGH (ref 0.44–1.00)
GFR, Estimated: 50 mL/min — ABNORMAL LOW (ref >60)
Glucose, Bld: 140 mg/dL — ABNORMAL HIGH (ref 70–99)
Potassium: 3.7 mmol/L (ref 3.5–5.1)
Sodium: 137 mmol/L (ref 135–145)
Total Bilirubin: 0.4 mg/dL (ref <1.2)
Total Protein: 6.8 g/dL (ref 6.5–8.1)

## 2023-01-17 LAB — GLUCOSE, CAPILLARY
Glucose-Capillary: 126 mg/dL — ABNORMAL HIGH (ref 70–99)
Glucose-Capillary: 132 mg/dL — ABNORMAL HIGH (ref 70–99)
Glucose-Capillary: 120 mg/dL — ABNORMAL HIGH (ref 70–99)
Glucose-Capillary: 144 mg/dL — ABNORMAL HIGH (ref 70–99)

## 2023-01-17 LAB — MRSA NEXT GEN BY PCR, NASAL: MRSA by PCR Next Gen: NOT DETECTED

## 2023-01-17 LAB — MAGNESIUM: Magnesium: 2.1 mg/dL (ref 1.7–2.4)

## 2023-01-17 MED ORDER — CHLORHEXIDINE GLUCONATE CLOTH 2 % EX PADS
6.0000 | MEDICATED_PAD | Freq: Every day | CUTANEOUS | Status: DC
  Administered 2023-01-17 – 2023-01-26 (×10): 6 via TOPICAL

## 2023-01-17 MED ORDER — LEVETIRACETAM 500 MG PO TABS
500.0000 mg | ORAL_TABLET | Freq: Two times a day (BID) | ORAL | Status: DC
  Administered 2023-01-17 – 2023-01-19 (×4): 500 mg via ORAL
  Filled 2023-01-17 (×4): qty 1

## 2023-01-17 MED ORDER — LEVETIRACETAM IN NACL 500 MG/100ML IV SOLN
500.0000 mg | Freq: Once | INTRAVENOUS | Status: AC
  Administered 2023-01-17: 500 mg via INTRAVENOUS
  Filled 2023-01-17: qty 100

## 2023-01-17 MED ORDER — MIDAZOLAM HCL (PF) 10 MG/2ML IJ SOLN: 1.0000 mg | Freq: Once | INTRAMUSCULAR | Status: DC | PRN

## 2023-01-17 MED ORDER — POTASSIUM CHLORIDE 2 MEQ/ML IV SOLN
INTRAVENOUS | Status: DC
  Filled 2023-01-17 (×2): qty 1000

## 2023-01-17 NOTE — Progress Notes (Signed)
PROGRESS NOTE   Tayah Idrovo Raulerson  ZOX:[id number] DOB: 06-Dec-1950 DOA: 01/12/2023 PCP: Kerri Perches, MD   Chief Complaint  Patient presents with   Urinary Frequency   Altered Mental Status   Level of care: Progressive  Brief Admission History:  72 year old female with history of diabetes mellitus type 2, hypertension, hyperlipidemia, COPD, stroke, seizure, hypothyroidism, tobacco abuse presenting with altered mental status.  The patient has private aides at home that help the patient throughout the day from 8 AM to 6 PM, 5 days a week.  Apparently, the aides have reported that the patient has been more somnolent and less interactive.  The patient has a history of urinary incontinence for which she follows urology.  Notably, the patient saw urology on 12/18/2022.  At that time, the patient's Myrbetriq dose was increased to 50 mg daily.  For her urge incontinence, UA did not suggest UTI.  Surgical options were considered, but the patient elected to proceed with expectant management.  The patient has also recently followed up with psychiatry, Dr. Tenny Craw on 01/04/2023.  During that visit, the patient's Wellbutrin was discontinued.  She was continued on Zoloft 100 mg daily, Lamictal 100 mg twice daily, Risperdal 0.5 mg at at bedtime, trazodone 150 mg at at bedtime, and Ativan 0.5 mg twice daily. The patient's niece relates that the patient has had worsening urinary incontinence and increasing difficulty maintaining her private living situation with home health aides.  At baseline, the patient states that she is able to make transfers with assistance.  She states that she is able to take a few steps with a walker.  The patient does require assistance with all her other activities of daily living.  From cognitive standpoint, the patient is  alert and oriented x 3 at baseline and able to carry on a conversation. There have been no reports of fevers, chills, chest pain, shortness breath, vomiting, diarrhea,  abdominal pain.  There is urinary incontinence but no frank dysuria.  The patient denies any recent changes in her medications.  She states that she manages her home medications. In the ED, the patient was afebrile hemodynamically stable with oxygen saturation 100% on room air.  WBC 6.2, hemoglobin 11.0, platelets 295.  Sodium 138, potassium 5.2, bicarbonate 24, serum creatinine 1.31.  AST 21, ALT 19, alkaline phosphatase 87, total bilirubin 0.4.  Corrected calcium 10.1.  VBG showed 7.3 5/50/41/27.  CT of the brain was negative for any acute findings.  CT of the cervical spine was negative for traumatic listhesis.  Chest x-ray showed basilar atelectasis.  The patient was admitted for further evaluation and treatment of her altered mental status.  The patient's Lyrica, Ativan, and Risperdal were held during the hospitalization.  Her hydrocodone was also held.  Her mental status gradually improved with subsequent days.  Initial metabolic workup was largely unremarkable.  MRI of the brain was negative for acute pathology but showed postsurgical changes reflecting previous bifrontal craniotomy for meningioma resection.  The appearance was grossly similar without acute abnormalities.  In the morning of 01/15/2023 the patient's mental status appeared near baseline.  However, the patient had been sitting up in the chair for about an hour when the patient's nurse returned to find the patient minimally responsive but awake and slump over to her right side.  Over a period of a few minutes, the patient became more awake; however, she became more agitated and confused.  She was able to follow simple one-step commands, but not answering questions  appropriately.  She remained confused and agitated trying to get out of bed and interfering with medical therapy.  CBG was in the 160s.  Vital signs were reassuring. There was concern that the patient may have had an unwitnessed seizure.  The patient was started on Keppra and EEG  was ordered and neurology was consulted.  EEG was neg for seizure.  Neurology recommended restart lyrica at lower dose and ultimately titrating back to home dose at some point.   01/17/23: Pt had more seizure like activity with prolonged post-ictal state and altered mentation, discussed with Dr Melynda Ripple who recommended transfer to [facility] for continuous EEG monitoring and in person neurology consultation.  Also, Dr. Melynda Ripple added keppra to regimen.    Assessment and Plan:  Uncontrolled Epilepsy - Pt appears to be having breakthrough seizures - discussed with Dr. Melynda Ripple who recommends transfer to [facility] for continuous EEG monitoring - transferred patient urgently to stepdown ICU for closely monitoring - treating acute seizures with IV versed per neurologist recommendation - check labs including CMP, magnesium and CBC diff - neurologist added keppra 500 mg BID  - continue lamictal 150 mg BID as ordered  - continue seizure precautions as ordered   Acute metabolic encephalopathy -Multifactorial including dehydration/AKI, polypharmacy/recurrent seizures  -11/3--more somnolent again today--hold d/c -11/3 MR brain--neg for acute finding -11/1 UA negative for pyuria -VBG--7.35/50/41/27 -B12--682 -TSH--0.529 -Ammonia--16 -Folic acid--13.3 -CT brain negative -Holding Lyrica and Ativan -PDMP reviewed--- hydrocodone 5/325, #15, last refill 12/14/2022; Ativan 0.5 mg, #60, last refill 01/01/2023 -Lyrica 75 mg, #90, last refill 11/17/2022 -Holding hydrocodone -d/c risperdal -01/15/23--pt sitting in chair; found slumped over but awake, then became agitated--suspect unwitnessed seizure  -appreciate neurology consult>>d/c keppra, increase lamictal to 150 mg bid -Started Keppra>>d/c per neurology -Obtain EEG--neg for seizure -Check ABG--7.42/44/100/28 on RA -Repeat UA >50 WBC, culture neg -UDS--neg -11/5 mental status back to baseline -11/6 recurrent seizure activity associated with acute decompensation   Acute  kidney injury -Baseline creatinine 0.8-1.0 -Presented with serum creatinine 1.31 -Continue IV fluids>> improving   Hyperkalemia -Improved with IV fluids   Controlled diabetes mellitus type 2 -11/03/2022 hemoglobin A1c 5.3 -01/13/23 A1C--5.8 -Continue NovoLog sliding scale -Reduce dose Semglee  CBG (last 3)  Recent Labs    01/16/23 2124 01/17/23 0730 01/17/23 1210  GLUCAP 138* 126* 132*    Essential hypertension -Continue carvedilol -restarted amlodipine   Mixed hyperlipidemia -Continue statin   Tobacco abuse -Tobacco cessation discussed   Hypothyroidism -Continue Synthroid   COPD -Continue Singulair   Depression/anxiety -Continue Zoloft, Lamictal   Deconditioning -PT eval--recommended SNF  DVT prophylaxis: enoxaparin Code Status: Full  Family Communication: telephone call to POA niece 01/17/23 Disposition: transfer to [facility]   Consultants:  Neurology Dr. Melynda Ripple   Procedures:   Antimicrobials:    Subjective: Pt starting having acute altered mental state and seizure like activity this morning, witnessed by MD, twitching and fasciculations in muscles of legs, involuntary movements of arms, legs, then became obtunded   Objective: Vitals:   01/17/23 1100 01/17/23 1200 01/17/23 1300 01/17/23 1400  BP: (!) 136/43 (!) 139/44 (!) 156/57 (!) 165/45  Pulse: 63 62 72 74  Resp: 17 17 16 12   Temp:      TempSrc:      SpO2: 100% 100% 100% 100%  Weight:      Height:        Intake/Output Summary (Last 24 hours) at 01/17/2023 1431 Last data filed at 01/17/2023 1400 Gross per 24 hour  Intake 1176 ml  Output 200 ml  Net 976 ml   Filed Weights   01/12/23 1638 01/12/23 2200  Weight: 73.5 kg 70.4 kg   Examination:  General exam: appears somnolent, responds to voice  Respiratory system: Clear to auscultation. Respiratory effort normal. Cardiovascular system: normal S1 & S2 heard. No JVD, murmurs, rubs, gallops or clicks. No pedal edema. Gastrointestinal system:  Abdomen is nondistended, soft and nontender. No organomegaly or masses felt. Normal bowel sounds heard. Central nervous system: somnolent / post-ictal state. Extremities: Symmetric 5 x 5 power. Skin: No rashes, lesions or ulcers. Psychiatry: Judgement and insight UTD.    Data Reviewed: I have personally reviewed following labs and imaging studies  CBC: Recent Labs  Lab 01/12/23 1706 01/13/23 0512 01/15/23 0458 01/15/23 1150  WBC 6.2 7.9 6.1 6.5  NEUTROABS 5.0  --   --   --   HGB 11.0* 10.4* 10.0* 10.0*  HCT 38.4 35.9* 34.2* 33.6*  MCV 76.5* 75.6* 74.7* 74.5*  PLT 295 292 255 263    Basic Metabolic Panel: Recent Labs  Lab 01/13/23 0512 01/14/23 0422 01/15/23 0458 01/15/23 1150 01/17/23 1023  NA 138 137 136 136 137  K 4.5 4.6 3.6 4.0 3.7  CL 107 105 105 104 103  CO2 23 24 23 23 23   GLUCOSE 121* 144* 139* 160* 140*  BUN 26* 19 24* 26* 28*  CREATININE 1.00 0.88 0.99 1.06* 1.16*  CALCIUM 9.8 9.3 9.4 9.5 9.2  MG 1.8 2.0 1.9  --  2.1  PHOS 2.9  --   --   --   --     CBG: Recent Labs  Lab 01/16/23 1143 01/16/23 1645 01/16/23 2124 01/17/23 0730 01/17/23 1210  GLUCAP 109* 149* 138* 126* 132*    Recent Results (from the past 240 hour(s))  Urine Culture     Status: None   Collection Time: 01/12/23  9:23 PM   Specimen: Urine, Catheterized  Result Value Ref Range Status   Specimen Description   Final    URINE, CATHETERIZED Performed at [facility], [address], [place], [place] [address]    Special Requests   Final    NONE Performed at [facility], [address] [place], [place] [address]    Culture   Final    NO GROWTH Performed at [facility], [address] [place], [place] [address]    Report Status 01/14/2023 FINAL  Final  MRSA Next Gen by PCR, Nasal     Status: None   Collection Time: 01/17/23 10:45 AM   Specimen: Nasal Mucosa; Nasal Swab  Result Value Ref Range Status   MRSA by PCR Next Gen NOT DETECTED NOT DETECTED Final    Comment:  Performed at [facility], [address] [place], [place] [address]     Radiology Studies: No results found.  Scheduled Meds:  amLODipine  5 mg Oral Daily   aspirin EC  81 mg Oral Q breakfast   carvedilol  12.5 mg Oral BID WC   Chlorhexidine Gluconate Cloth  6 each Topical Daily   enoxaparin (LOVENOX) injection  40 mg Subcutaneous Q24H   ezetimibe  10 mg Oral Daily   insulin aspart  0-15 Units Subcutaneous TID WC   insulin aspart  0-5 Units Subcutaneous QHS   insulin glargine-yfgn  10 Units Subcutaneous QHS   lamoTRIgine  150 mg Oral BID   levETIRAcetam  500 mg Oral BID   mirabegron ER  50 mg Oral Daily   montelukast  10  mg Oral Daily   polyethylene glycol  17 g Oral Daily   rosuvastatin  5 mg Oral QHS   sertraline  100 mg Oral Daily   Continuous Infusions:  lactated ringers 1,000 mL with potassium chloride 20 mEq infusion 60 mL/hr at 01/17/23 1212     LOS: 1 day   Critical Care Procedure Note Authorized and Performed by: Maryln Manuel MD  Total Critical Care time:  58 mins Due to a high probability of clinically significant, life threatening deterioration, the patient required my highest level of preparedness to intervene emergently and I personally spent this critical care time directly and personally managing the patient.  This critical care time included obtaining a history; examining the patient, pulse oximetry; ordering and review of studies; arranging urgent treatment with development of a management plan; evaluation of patient's response of treatment; frequent reassessment; and discussions with other providers.  This critical care time was performed to assess and manage the high probability of imminent and life threatening deterioration that could result in multi-organ failure.  It was exclusive of separately billable procedures and treating other patients and teaching time.    Standley Dakins, MD How to contact the [facility] Attending or Consulting provider 7A - 7P or covering  provider during after hours 7P -7A, for this patient?  Check the care team in [facility] and look for a) attending/consulting TRH provider listed and b) the [facility] team listed Log into [email or web address] to find provider on call.  Locate the [facility] provider you are looking for under [facility] and [place] to a number that you can be directly reached. If you still have difficulty reaching the provider, please R[place]ira the [facility] (Director on Call) for the Hospitalists listed on amion for assistance.  01/17/2023, 2:31 PM

## 2023-01-17 NOTE — TOC Progression Note (Signed)
Transition of Care [facility]) - Progression Note    Patient Details  Name: [name] MRN: [id number] Date of Birth: April 23, 1950  Transition of Care [facility]) CM/SW Contact  Villa Herb, [place] Phone Number: 01/17/2023, 10:44 AM  Clinical Narrative:    Plan is for SNF at [facility]. CSW spoke with MD and pt has been moved to the ICU. Insurance auth set to expire today for SNF, due to change in medical condition TOC will cancel auth and restart closer to pt being medically stable. TOC to follow.   Expected Discharge Plan: Skilled Nursing Facility Barriers to Discharge: Continued Medical Work up  Expected Discharge Plan and Services     Post Acute Care Choice: Home Health Living arrangements for the past 2 months: Single Family Home                           HH Arranged: PT, OT, Social Work, Refused SNF HH Agency: [facility] Date [facility] Agency Contacted: 01/13/23 Time HH Agency Contacted: 1240 Representative spoke with at [facility] Agency: Cindie   Social Determinants of Health (SDOH) Interventions SDOH Screenings   Food Insecurity: No Food Insecurity (01/12/2023)  Housing: Patient Declined (01/12/2023)  Transportation Needs: No Transportation Needs (01/12/2023)  Utilities: Not At Risk (01/12/2023)  Alcohol Screen: Low Risk  (07/17/2022)  Depression (PHQ2-9): Low Risk  (12/12/2022)  Financial Resource Strain: Low Risk  (07/17/2022)  Physical Activity: Insufficiently Active (07/17/2022)  Social Connections: Moderately Integrated (07/17/2022)  Stress: No Stress Concern Present (07/17/2022)  Tobacco Use: High Risk (01/13/2023)  Health Literacy: Adequate Health Literacy (12/14/2022)    Readmission Risk Interventions    08/21/2022   11:54 AM 09/16/2021   10:07 AM  Readmission Risk Prevention Plan  Transportation Screening Complete Complete  Medication Review (RN Care Manager) Complete Complete  PCP or Specialist appointment within 3-5 days of discharge Complete   HRI or Home Care  Consult Complete Complete  SW Recovery Care/Counseling Consult Complete Complete  Palliative Care Screening Not Applicable Not Applicable  Skilled Nursing Facility Not Applicable Patient Refused

## 2023-01-17 NOTE — Progress Notes (Signed)
Physical Therapy Treatment Patient Details Name: [name] MRN: [id number] DOB: 12-25-1950 Today's Date: 01/17/2023   History of Present Illness a 72 y.o. female with medical history significant of hypertension, hyperlipidemia, GERD, COPD, hypothyroidism, seizures, paralysis, stroke who presents to the emergency department due to altered mental status.  Patient was unable to provide a history possibly due to current altered mental status, history was obtained from ED PA and medical record.  Per report, patient has nurse aides at her home and she was reported to have had increased urinary frequency since past several days and has not been acting like herself.  Patient was also noted to have different pills for different days in her pill dispenser, it is unknown what RMG's patient's medication as the nurse aide states that patient was only assisted with ADLs without RN to arrange for medications.  Regarding the urinary frequency, daughter (by phone) endorsed that patient has had incontinence since June of this year and usually wets her diapers per ED PA.  At baseline, patient ambulates with cane and walker  No report of fever, chills, headache, chest pain, shortness of breath, nausea or vomiting.    PT Comments  Pt tolerated today's treatment session, well but showing fatigue, poor command following, and writhing like movement with poor extremity and trunk control to date. Today's session addressed functional transfer training with guided sit/stands from EOB. Pt noted with continued balance deficits and reduced transfer capacity needing mod assist to power to stand with balancing as well. To date, pt continued after therapys session moving toward EOB, all four guard rails in place due to poor command following and bed alarm set. MD and LPN notified of writhing-like and poor extremity control during treatment. Pt would continue to benefit from skilled acute physical therapy services in order to progress  toward POC goals, safety/independence with functional mobility and QOL.     If plan is discharge home, recommend the following: A lot of help with walking and/or transfers;A lot of help with bathing/dressing/bathroom   Can travel by private vehicle        Equipment Recommendations       Recommendations for Other Services OT consult     Precautions / Restrictions Precautions Precautions: Fall Restrictions Weight Bearing Restrictions: No     Mobility  Bed Mobility Overal bed mobility: Needs Assistance Bed Mobility: Supine to Sit, Sit to Supine     Supine to sit: Contact guard Sit to supine: Mod assist   General bed mobility comments: Arrived with BLE sititng halfway off bed and HOB elevated. Pt swing into sitting w/ CGA for balance, fast, impulsive movements with poor trunk control. Sitting EOB @ CGA with cues for lateral weight shifting to anterior weight shift to prepare for sit/stands. Mod assist for return to supine with assist at BLE and heaving verbal cues for proper movement patterns. Pt showing impulsivity and intermittently after laying down tyring to swing BLE off EOB. pt cues for superior scoot for bed positioning, able to follow commands with guidance for supine bridge and pt using BUE for pulling on overhead bedrail. Patient Response: Cooperative, Flat affect, Impulsive  Transfers Overall transfer level: Needs assistance Equipment used: Rolling walker (2 wheels) Transfers: Sit to/from Stand Sit to Stand: Mod assist           General transfer comment: mod assit for 4x STS from EOB with RW. Assuming bear hold like position infront of patient.   Pt utilizing therapist forearms as UE support while therapist  providing mod assist for powerup to stand and maintaining balance in standing. poor trunk control and UE control during sit/stand trials with writhing like movements. Poor anterior weight shifts in preparation for standing.    Ambulation/Gait                General Gait Details: Not addressed today due to poor command following and onset of writhing movements increasing risk of falls.   Stairs             Wheelchair Mobility     Tilt Bed Tilt Bed Patient Response: Cooperative, Flat affect, Impulsive  Modified Rankin (Stroke Patients Only)       Balance Overall balance assessment: Needs assistance Sitting-balance support: No upper extremity supported Sitting balance-Leahy Scale: Fair Sitting balance - Comments: good/fair sitting balance this session, more guarding needing during weight shifts but showing poor control of trunk during weight shifts with writhing like movement patterns.   Standing balance support: During functional activity Standing balance-Leahy Scale: Fair Standing balance comment: poor/fair standing balance this session with UE support on therapists arm.                            Cognition Arousal: Lethargic Behavior During Therapy: WFL for tasks assessed/performed Overall Cognitive Status: Within Functional Limits for tasks assessed                                          Exercises General Exercises - Lower Extremity Long Arc Quad: AROM, Both, 10 reps Shoulder Exercises Shoulder Flexion: AROM, 5 reps, Both    General Comments        Pertinent Vitals/Pain Pain Assessment Pain Assessment: No/denies pain    Home Living                          Prior Function            PT Goals (current goals can now be found in the care plan section) Acute Rehab PT Goals Patient Stated Goal: Go home PT Goal Formulation: With patient Time For Goal Achievement: 01/27/23 Potential to Achieve Goals: Good Progress towards PT goals: Progressing toward goals    Frequency    Min 3X/week      PT Plan      Co-evaluation              AM-PAC PT "6 Clicks" Mobility   Outcome Measure  Help needed turning from your back to your side while in a flat bed  without using bedrails?: A Little Help needed moving from lying on your back to sitting on the side of a flat bed without using bedrails?: A Little Help needed moving to and from a bed to a chair (including a wheelchair)?: A Lot Help needed standing up from a chair using your arms (e.g., wheelchair or bedside chair)?: A Lot Help needed to walk in hospital room?: A Lot Help needed climbing 3-5 steps with a railing? : A Lot 6 Click Score: 14    End of Session Equipment Utilized During Treatment: Gait belt Activity Tolerance: Patient tolerated treatment well;Patient limited by fatigue Patient left: in bed;with bed alarm set;with family/visitor present Nurse Communication: Mobility status PT Visit Diagnosis: Unsteadiness on feet (R26.81);Muscle weakness (generalized) (M62.81)     Time: 1610-9604 PT Time Calculation (min) (ACUTE  ONLY): 19 min  Charges:    $Therapeutic Activity: 8-22 mins PT General Charges $$ ACUTE PT VISIT: 1 Visit                     Nelida Meuse PT, DPT Physical Therapist with [facility] [phone] office`   Nelida Meuse 01/17/2023, 9:34 AM

## 2023-01-17 NOTE — Progress Notes (Addendum)
Patient found alert but not responsive to verbal stimuli. Patient with rapid movement of all extremities and head, MD Laural Benes and other staff at bedside. Patient given IV Versed and patient demonstrated a postictal state with confusion and not interacting with staff. BP 115/39, HR 66, 02 100% on RA, patient positioned at 45% angle in bed. Patient continued to have on and off movement of extremities and head with continued confusion. Patient transferred to ICU. Son Audria Nine notified of patients status.

## 2023-01-17 NOTE — Progress Notes (Signed)
Patient reports feeling a seizure coming on. Arms and legs slightly shaky and patient very restless and concerned of possible seizure. PRN versed provided to prevent seizure and help patient relax. RN at bedside to monitor patient.

## 2023-01-17 NOTE — Plan of Care (Signed)
  Problem: Education: Goal: Knowledge of General Education information will improve Description: Including pain rating scale, medication(s)/side effects and non-pharmacologic comfort measures Outcome: Progressing   Problem: Health Behavior/Discharge Planning: Goal: Ability to manage health-related needs will improve Outcome: Progressing   Problem: Clinical Measurements: Goal: Ability to maintain clinical measurements within normal limits will improve Outcome: Progressing Goal: Will remain free from infection Outcome: Progressing   Problem: Activity: Goal: Risk for activity intolerance will decrease Outcome: Progressing   Problem: Nutrition: Goal: Adequate nutrition will be maintained Outcome: Progressing   Problem: Elimination: Goal: Will not experience complications related to bowel motility Outcome: Progressing Goal: Will not experience complications related to urinary retention Outcome: Progressing   Problem: Pain Management: Goal: General experience of comfort will improve Outcome: Progressing   Problem: Safety: Goal: Ability to remain free from injury will improve Outcome: Progressing   Problem: Coping: Goal: Ability to adjust to condition or change in health will improve Outcome: Progressing   Problem: Health Behavior/Discharge Planning: Goal: Ability to identify and utilize available resources and services will improve Outcome: Progressing Goal: Ability to manage health-related needs will improve Outcome: Progressing   Problem: Metabolic: Goal: Ability to maintain appropriate glucose levels will improve Outcome: Progressing

## 2023-01-17 NOTE — Progress Notes (Signed)
I connected with  [name] on 01/17/23 by a video enabled telemedicine application and verified that I am speaking with the correct person using two identifiers.   I discussed the limitations of evaluation and management by telemedicine. The patient expressed understanding and agreed to proceed.  Location of patient: [facility] Location of physician: [facility]   Subjective: Per Dr. Laural Benes, patient had another seizure-like episode this morning described as "shaking in both arms and legs, obtunded, muscular twitches in her legs".  Reportedly she had 1 more seizure before this.  Patient states she does not remember having any more seizures.  ROS: negative except above  Examination  Vital signs in last 24 hours: Temp:  [98.4 F (36.9 C)-98.7 F (37.1 C)] 98.7 F (37.1 C) (11/05 2130) Pulse Rate:  [64-72] 64 (11/06 1048) Resp:  [16-25] 20 (11/06 1048) BP: (113-150)/(33-58) 150/33 (11/06 1041) SpO2:  [98 %-100 %] 100 % (11/06 1048)  General: lying in bed, NAD Neuro: AOx3, no aphasia, CN 2-12 appear grossly intact, antigravity strength in all extremities without drift, FTN intact BL, reports intact sensation to light touch, has essential and intentional tremot in right>left hand   Basic Metabolic Panel: Recent Labs  Lab 01/13/23 0512 01/14/23 0422 01/15/23 0458 01/15/23 1150 01/17/23 1023  NA 138 137 136 136 137  K 4.5 4.6 3.6 4.0 3.7  CL 107 105 105 104 103  CO2 23 24 23 23 23   GLUCOSE 121* 144* 139* 160* 140*  BUN 26* 19 24* 26* 28*  CREATININE 1.00 0.88 0.99 1.06* 1.16*  CALCIUM 9.8 9.3 9.4 9.5 9.2  MG 1.8 2.0 1.9  --   --   PHOS 2.9  --   --   --   --     CBC: Recent Labs  Lab 01/12/23 1706 01/13/23 0512 01/15/23 0458 01/15/23 1150  WBC 6.2 7.9 6.1 6.5  NEUTROABS 5.0  --   --   --   HGB 11.0* 10.4* 10.0* 10.0*  HCT 38.4 35.9* 34.2* 33.6*  MCV 76.5* 75.6* 74.7* 74.5*  PLT 295 292 255 263     Coagulation Studies: No results for  input(s): "LABPROT", "INR" in the last 72 hours.  Imaging No new brain imaging overnight  ASSESSMENT AND PLAN:72yo F with ho meningioma, seizures noted to have an episode of alteration of awareness for few minutes followed bu gradual return to baseline.    Transient alteration of awareness - Pregabalin and ativan were held due to ams. As these meds also have anti-seizure properties, I suspect patient had breakthrough seizure. Other ddx include behavioral vs delirium   Recommendations: -Continue lamotrigine 150 mg twice daily -Due to more seizure-like episodes, will resume Keppra 500 mg twice daily for now -Would recommend transfer to [facility] for long-term EEG for characterization of spells - Consider resuming pregabalin at lower dose if tolerated - seizure and delirium precautions - prn iv versed for clinical seizure - Discussed plan with patient and Dr Laural Benes via secure chat   Thank you for allowing [name] to participate in the care of this patient. If you have any further questions, please contact  me or neurohospitalist.   I have spent a total of36   minutes with the patient reviewing hospital notes,  test results, labs and examining the patient as well as establishing an assessment and plan that was discussed personally with the patient.  > 50% of time was spent in direct patient care.  Lindie Spruce Epilepsy [facility] For questions after 5pm please refer to AMION to reach the Neurologist on call

## 2023-01-18 ENCOUNTER — Other Ambulatory Visit: Payer: Self-pay | Admitting: *Deleted

## 2023-01-18 DIAGNOSIS — N179 Acute kidney failure, unspecified: Secondary | ICD-10-CM | POA: Diagnosis not present

## 2023-01-18 DIAGNOSIS — F17210 Nicotine dependence, cigarettes, uncomplicated: Secondary | ICD-10-CM

## 2023-01-18 DIAGNOSIS — Z122 Encounter for screening for malignant neoplasm of respiratory organs: Secondary | ICD-10-CM

## 2023-01-18 DIAGNOSIS — G40909 Epilepsy, unspecified, not intractable, without status epilepticus: Secondary | ICD-10-CM | POA: Diagnosis not present

## 2023-01-18 DIAGNOSIS — Z87891 Personal history of nicotine dependence: Secondary | ICD-10-CM

## 2023-01-18 DIAGNOSIS — E875 Hyperkalemia: Secondary | ICD-10-CM | POA: Diagnosis not present

## 2023-01-18 DIAGNOSIS — G9341 Metabolic encephalopathy: Secondary | ICD-10-CM | POA: Diagnosis not present

## 2023-01-18 LAB — COMPREHENSIVE METABOLIC PANEL
ALT: 17 U/L (ref 0–44)
AST: 21 U/L (ref 15–41)
Albumin: 3.6 g/dL (ref 3.5–5.0)
Alkaline Phosphatase: 64 U/L (ref 38–126)
Anion gap: 7 (ref 5–15)
BUN: 19 mg/dL (ref 8–23)
CO2: 22 mmol/L (ref 22–32)
Calcium: 9.1 mg/dL (ref 8.9–10.3)
Chloride: 110 mmol/L (ref 98–111)
Creatinine, Ser: 0.95 mg/dL (ref 0.44–1.00)
GFR, Estimated: >60 mL/min (ref >60)
Glucose, Bld: 119 mg/dL — ABNORMAL HIGH (ref 70–99)
Potassium: 4.2 mmol/L (ref 3.5–5.1)
Sodium: 139 mmol/L (ref 135–145)
Total Bilirubin: 0.4 mg/dL (ref <1.2)
Total Protein: 6.5 g/dL (ref 6.5–8.1)

## 2023-01-18 LAB — CK: Total CK: 271 U/L — ABNORMAL HIGH (ref 38–234)

## 2023-01-18 LAB — MAGNESIUM: Magnesium: 2 mg/dL (ref 1.7–2.4)

## 2023-01-18 LAB — GLUCOSE, CAPILLARY
Glucose-Capillary: 137 mg/dL — ABNORMAL HIGH (ref 70–99)
Glucose-Capillary: 129 mg/dL — ABNORMAL HIGH (ref 70–99)
Glucose-Capillary: 233 mg/dL — ABNORMAL HIGH (ref 70–99)
Glucose-Capillary: 111 mg/dL — ABNORMAL HIGH (ref 70–99)

## 2023-01-18 MED ORDER — SENNOSIDES-DOCUSATE SODIUM 8.6-50 MG PO TABS
1.0000 | ORAL_TABLET | Freq: Two times a day (BID) | ORAL | Status: DC
  Administered 2023-01-18 – 2023-01-26 (×15): 1 via ORAL
  Filled 2023-01-18 (×18): qty 1

## 2023-01-18 MED ORDER — LORAZEPAM 0.5 MG PO TABS
0.2500 mg | ORAL_TABLET | Freq: Two times a day (BID) | ORAL | Status: DC
  Administered 2023-01-18 – 2023-01-26 (×17): 0.25 mg via ORAL
  Filled 2023-01-18 (×19): qty 1

## 2023-01-18 NOTE — Progress Notes (Signed)
PT Cancellation Note  Patient Details Name: [name] MRN: [id number] DOB: [birth year]   Cancelled Treatment:    Reason Eval/Treat Not Completed: Medical issues which prohibited therapy.  Patient transferred to a higher level of care and will need new PT consult to resume therapy when patient is medically stable.  Thank you.    1:45 PM, 01/18/23 Ocie Bob, MPT Physical Therapist with [facility] [phone] office [phone] mobile phone

## 2023-01-18 NOTE — Progress Notes (Signed)
Patient laid in bed with eyes closed throughout the night/morning, resting; awaking during pt. Rounding checks, repositioning, and vitals being taken. Vitals signs within normal levels. No seizure activity from this nurse's shift. Will continue to monitor and report off to oncoming shift accordingly.

## 2023-01-18 NOTE — Progress Notes (Signed)
PROGRESS NOTE   Kayren Holck Wilmer  ZOX:[id number] DOB: Apr 04, 1950 DOA: 01/12/2023 PCP: Kerri Perches, MD   Chief Complaint  Patient presents with   Urinary Frequency   Altered Mental Status   Level of care: Progressive  Brief Admission History:  72 year old female with history of diabetes mellitus type 2, hypertension, hyperlipidemia, COPD, stroke, seizure, hypothyroidism, tobacco abuse presenting with altered mental status.  The patient has private aides at home that help the patient throughout the day from 8 AM to 6 PM, 5 days a week.  Apparently, the aides have reported that the patient has been more somnolent and less interactive.  The patient has a history of urinary incontinence for which she follows urology.  Notably, the patient saw urology on 12/18/2022.  At that time, the patient's Myrbetriq dose was increased to 50 mg daily.  For her urge incontinence, UA did not suggest UTI.  Surgical options were considered, but the patient elected to proceed with expectant management.  The patient has also recently followed up with psychiatry, Dr. Tenny Craw on 01/04/2023.  During that visit, the patient's Wellbutrin was discontinued.  She was continued on Zoloft 100 mg daily, Lamictal 100 mg twice daily, Risperdal 0.5 mg at at bedtime, trazodone 150 mg at at bedtime, and Ativan 0.5 mg twice daily. The patient's niece relates that the patient has had worsening urinary incontinence and increasing difficulty maintaining her private living situation with home health aides.  At baseline, the patient states that she is able to make transfers with assistance.  She states that she is able to take a few steps with a walker.  The patient does require assistance with all her other activities of daily living.  From cognitive standpoint, the patient is  alert and oriented x 3 at baseline and able to carry on a conversation. There have been no reports of fevers, chills, chest pain, shortness breath, vomiting, diarrhea,  abdominal pain.  There is urinary incontinence but no frank dysuria.  The patient denies any recent changes in her medications.  She states that she manages her home medications. In the ED, the patient was afebrile hemodynamically stable with oxygen saturation 100% on room air.  WBC 6.2, hemoglobin 11.0, platelets 295.  Sodium 138, potassium 5.2, bicarbonate 24, serum creatinine 1.31.  AST 21, ALT 19, alkaline phosphatase 87, total bilirubin 0.4.  Corrected calcium 10.1.  VBG showed 7.3 5/50/41/27.  CT of the brain was negative for any acute findings.  CT of the cervical spine was negative for traumatic listhesis.  Chest x-ray showed basilar atelectasis.  The patient was admitted for further evaluation and treatment of her altered mental status.  The patient's Lyrica, Ativan, and Risperdal were held during the hospitalization.  Her hydrocodone was also held.  Her mental status gradually improved with subsequent days.  Initial metabolic workup was largely unremarkable.  MRI of the brain was negative for acute pathology but showed postsurgical changes reflecting previous bifrontal craniotomy for meningioma resection.  The appearance was grossly similar without acute abnormalities.  In the morning of 01/15/2023 the patient's mental status appeared near baseline.  However, the patient had been sitting up in the chair for about an hour when the patient's nurse returned to find the patient minimally responsive but awake and slump over to her right side.  Over a period of a few minutes, the patient became more awake; however, she became more agitated and confused.  She was able to follow simple one-step commands, but not answering questions  appropriately.  She remained confused and agitated trying to get out of bed and interfering with medical therapy.  CBG was in the 160s.  Vital signs were reassuring. There was concern that the patient may have had an unwitnessed seizure.  The patient was started on Keppra and EEG  was ordered and neurology was consulted.  EEG was neg for seizure.  Neurology recommended restart lyrica at lower dose and ultimately titrating back to home dose at some point.   01/17/23: Pt had more seizure like activity with prolonged post-ictal state and altered mentation, discussed with Dr Melynda Ripple who recommended transfer to [facility] for continuous EEG monitoring and in person neurology consultation.  Also, Dr. Melynda Ripple added keppra to regimen.    Assessment and Plan:  Uncontrolled Epilepsy - Pt appears to be having breakthrough seizures - discussed with Dr. Melynda Ripple who recommends transfer to [facility] for continuous EEG monitoring - transferred patient urgently to stepdown ICU for closely monitoring - treating acute seizures with IV versed per neurologist recommendation - check labs including CMP, magnesium and CBC diff - neurologist added keppra 500 mg BID  - continue lamictal 150 mg BID as ordered  - continue seizure precautions as ordered  - discussed possible benzodiazepine withdrawal seizure with neurologist today - resumed low dose lorazepam 0.25 mg BID, pt reports she takes 0.5 mg TID regularly for long time, it was held on admission due to altered mentation, somnolent, polypharmacy  Acute metabolic encephalopathy  -Multifactorial including dehydration/AKI, polypharmacy/recurrent seizures  -11/3--more somnolent again today--hold d/c -11/3 MR brain--neg for acute finding -11/1 UA negative for pyuria -VBG--7.35/50/41/27 -B12--682 -TSH--0.529 -Ammonia--16 -Folic acid--13.3 -CT brain negative -Holding Lyrica and Ativan -PDMP reviewed--- hydrocodone 5/325, #15, last refill 12/14/2022; Ativan 0.5 mg, #60, last refill 01/01/2023 -Lyrica 75 mg, #90, last refill 11/17/2022 -Holding hydrocodone -d/c risperdal -01/15/23--pt sitting in chair; found slumped over but awake, then became agitated--suspect unwitnessed seizure  -appreciate neurology consult>>d/c keppra, increase lamictal to 150 mg  bid -Started Keppra>>d/c per neurology -Obtain EEG--neg for seizure -Check ABG--7.42/44/100/28 on RA -Repeat UA >50 WBC, culture neg -UDS--neg -11/5 mental status back to baseline -11/6 recurrent seizure activity associated with acute decompensation, placed order to transfer to [facility] per neurology request   Acute kidney injury -Baseline creatinine 0.8-1.0 -Presented with serum creatinine 1.31 -Continue IV fluids>> improved to 0.95   Hyperkalemia -Improved with IV fluids, IV fluids completed 11/7   Controlled diabetes mellitus type 2 -11/03/2022 hemoglobin A1c 5.3 -01/13/23 A1C--5.8 -Continue NovoLog sliding scale -Reduce dose Semglee  CBG (last 3)  Recent Labs    01/17/23 1957 01/18/23 0731 01/18/23 1141  GLUCAP 144* 137* 129*    Essential hypertension -Continue carvedilol 12.5 mg BID  -restarted amlodipine 5 mg daily    Mixed hyperlipidemia -Continue statin   Tobacco abuse -Tobacco cessation discussed   Hypothyroidism -Continue Synthroid   COPD -Continue Singulair   Depression/anxiety -Continue Zoloft, Lamictal   Deconditioning -PT eval--recommended SNF  DVT prophylaxis: enoxaparin Code Status: Full  Family Communication: telephone call to POA niece 01/17/23 Disposition: [facility]   Consultants:  Neurology Dr. Melynda Ripple, discussed with on 11/6, 11/7  Procedures:   Antimicrobials:    Subjective: Pt more alert and communicative today, eating and drinking without difficulty  Objective: Vitals:   01/18/23 0910 01/18/23 1007 01/18/23 1100 01/18/23 1146  BP: (!) 151/47 (!) 156/56 (!) 156/41   Pulse: 71 65 65   Resp:  18 (!) 22   Temp:    97.7 F (36.5 C)  TempSrc:    Axillary  SpO2:  96% 96%   Weight:      Height:        Intake/Output Summary (Last 24 hours) at 01/18/2023 1413 Last data filed at 01/18/2023 0737 Gross per 24 hour  Intake 1321.86 ml  Output 1200 ml  Net 121.86 ml   Filed Weights   01/12/23 1638 01/12/23 2200  Weight: 73.5  kg 70.4 kg   Examination:  General exam: appears awake, alert, cooperative, NAD. Edentulous.    Respiratory system: Clear to auscultation. Respiratory effort normal. Cardiovascular system: normal S1 & S2 heard. No JVD, murmurs, rubs, gallops or clicks. No pedal edema. Gastrointestinal system: Abdomen is nondistended, soft and nontender. No organomegaly or masses felt. Normal bowel sounds heard. Central nervous system: no focal deficits on exam today.  Extremities: Symmetric 5 x 5 power. Skin: No rashes, lesions or ulcers. Psychiatry: Judgement and insight UTD.    Data Reviewed: I have personally reviewed following labs and imaging studies  CBC: Recent Labs  Lab 01/12/23 1706 01/13/23 0512 01/15/23 0458 01/15/23 1150  WBC 6.2 7.9 6.1 6.5  NEUTROABS 5.0  --   --   --   HGB 11.0* 10.4* 10.0* 10.0*  HCT 38.4 35.9* 34.2* 33.6*  MCV 76.5* 75.6* 74.7* 74.5*  PLT 295 292 255 263    Basic Metabolic Panel: Recent Labs  Lab 01/13/23 0512 01/14/23 0422 01/15/23 0458 01/15/23 1150 01/17/23 1023 01/18/23 0451  NA 138 137 136 136 137 139  K 4.5 4.6 3.6 4.0 3.7 4.2  CL 107 105 105 104 103 110  CO2 23 24 23 23 23 22   GLUCOSE 121* 144* 139* 160* 140* 119*  BUN 26* 19 24* 26* 28* 19  CREATININE 1.00 0.88 0.99 1.06* 1.16* 0.95  CALCIUM 9.8 9.3 9.4 9.5 9.2 9.1  MG 1.8 2.0 1.9  --  2.1 2.0  PHOS 2.9  --   --   --   --   --     CBG: Recent Labs  Lab 01/17/23 1210 01/17/23 1713 01/17/23 1957 01/18/23 0731 01/18/23 1141  GLUCAP 132* 120* 144* 137* 129*    Recent Results (from the past 240 hour(s))  Urine Culture     Status: None   Collection Time: 01/12/23  9:23 PM   Specimen: Urine, Catheterized  Result Value Ref Range Status   Specimen Description   Final    URINE, CATHETERIZED Performed at [facility], [address] [place], [place] [address]    Special Requests   Final    NONE Performed at [facility], [address], [place], [place] [address]    Culture    Final    NO GROWTH Performed at [facility], [address], [place], [place] [address]    Report Status 01/14/2023 FINAL  Final  MRSA Next Gen by PCR, Nasal     Status: None   Collection Time: 01/17/23 10:45 AM   Specimen: Nasal Mucosa; Nasal Swab  Result Value Ref Range Status   MRSA by PCR Next Gen NOT DETECTED NOT DETECTED Final    Comment: Performed at [facility], [address], [place], [place] [address]     Radiology Studies: No results found.  Scheduled Meds:  amLODipine  5 mg Oral Daily   aspirin EC  81 mg Oral Q breakfast   carvedilol  12.5 mg Oral BID WC   Chlorhexidine Gluconate Cloth  6 each Topical Daily   enoxaparin (LOVENOX) injection  40  mg Subcutaneous Q24H   ezetimibe  10 mg Oral Daily   insulin aspart  0-15 Units Subcutaneous TID WC   insulin aspart  0-5 Units Subcutaneous QHS   insulin glargine-yfgn  10 Units Subcutaneous QHS   lamoTRIgine  150 mg Oral BID   levETIRAcetam  500 mg Oral BID   LORazepam  0.25 mg Oral BID   mirabegron ER  50 mg Oral Daily   montelukast  10 mg Oral Daily   polyethylene glycol  17 g Oral Daily   rosuvastatin  5 mg Oral QHS   sertraline  100 mg Oral Daily   Continuous Infusions:     LOS: 2 days   Critical Care Procedure Note Authorized and Performed by: Maryln Manuel MD  Total Critical Care time:  50 mins Due to a high probability of clinically significant, life threatening deterioration, the patient required my highest level of preparedness to intervene emergently and I personally spent this critical care time directly and personally managing the patient.  This critical care time included obtaining a history; examining the patient, pulse oximetry; ordering and review of studies; arranging urgent treatment with development of a management plan; evaluation of patient's response of treatment; frequent reassessment; and discussions with other providers.  This critical care time was performed to assess and manage the high  probability of imminent and life threatening deterioration that could result in multi-organ failure.  It was exclusive of separately billable procedures and treating other patients and teaching time.    Standley Dakins, MD How to contact the [facility] Attending or Consulting provider 7A - 7P or covering provider during after hours 7P -7A, for this patient?  Check the care team in [facility] and look for a) attending/consulting TR[facility] provider listed and b) the [facility] team listed Log into [email or web address] to find provider on call.  Locate the [facility] provider you are looking for under [facility] and [place] to a number that you can be directly reached. If you still have difficulty reaching the provider, please Gott the [facility] (Director on Call) for the Hospitalists listed on amion for assistance.  01/18/2023, 2:13 PM

## 2023-01-18 NOTE — Progress Notes (Signed)
Received from [facility] by [facility].  Oriented to room and surroundings.

## 2023-01-19 ENCOUNTER — Inpatient Hospital Stay (HOSPITAL_COMMUNITY): Payer: Medicare HMO

## 2023-01-19 DIAGNOSIS — G9341 Metabolic encephalopathy: Secondary | ICD-10-CM | POA: Diagnosis not present

## 2023-01-19 DIAGNOSIS — J449 Chronic obstructive pulmonary disease, unspecified: Secondary | ICD-10-CM | POA: Diagnosis not present

## 2023-01-19 DIAGNOSIS — Z8673 Personal history of transient ischemic attack (TIA), and cerebral infarction without residual deficits: Secondary | ICD-10-CM

## 2023-01-19 DIAGNOSIS — G40909 Epilepsy, unspecified, not intractable, without status epilepticus: Secondary | ICD-10-CM | POA: Diagnosis not present

## 2023-01-19 DIAGNOSIS — N179 Acute kidney failure, unspecified: Secondary | ICD-10-CM | POA: Diagnosis not present

## 2023-01-19 DIAGNOSIS — R569 Unspecified convulsions: Secondary | ICD-10-CM | POA: Diagnosis not present

## 2023-01-19 LAB — CREATININE, SERUM
Creatinine, Ser: 1.07 mg/dL — ABNORMAL HIGH (ref 0.44–1.00)
GFR, Estimated: 55 mL/min — ABNORMAL LOW (ref >60)

## 2023-01-19 LAB — GLUCOSE, CAPILLARY
Glucose-Capillary: 87 mg/dL (ref 70–99)
Glucose-Capillary: 127 mg/dL — ABNORMAL HIGH (ref 70–99)
Glucose-Capillary: 121 mg/dL — ABNORMAL HIGH (ref 70–99)
Glucose-Capillary: 123 mg/dL — ABNORMAL HIGH (ref 70–99)

## 2023-01-19 MED ORDER — LEVOTHYROXINE SODIUM 50 MCG PO TABS
50.0000 ug | ORAL_TABLET | ORAL | Status: DC
  Administered 2023-01-19 – 2023-01-25 (×6): 50 ug via ORAL
  Filled 2023-01-19 (×7): qty 1

## 2023-01-19 MED ORDER — LEVOTHYROXINE SODIUM 25 MCG PO TABS
25.0000 ug | ORAL_TABLET | ORAL | Status: DC
  Administered 2023-01-22: 25 ug via ORAL
  Filled 2023-01-19: qty 1

## 2023-01-19 MED ORDER — POLYETHYLENE GLYCOL 3350 17 G PO PACK
17.0000 g | PACK | Freq: Two times a day (BID) | ORAL | Status: DC
  Administered 2023-01-19 – 2023-01-26 (×14): 17 g via ORAL
  Filled 2023-01-19 (×17): qty 1

## 2023-01-19 NOTE — Progress Notes (Addendum)
PROGRESS NOTE    [name]  WNU:[id number] DOB: 06-16-1950 DOA: 01/12/2023 PCP: Kerri Perches, MD   Brief Narrative: [name] is a 72 y.o. female with a history of diabetes mellitus type 2, hypertension, hyperlipidemia, COPD, seizure, stroke, hypothyroidism, tobacco use.  Patient presented secondary to urinary frequency and altered mental status and was found to have evidence of AKI, toxic/metabolic encephalopathy and breakthrough seizures. Home medications were adjusted and AKI treated with improvement of mental status.    Assessment and Plan:  Seizure disorder Breakthrough seizures Patient with seizure activity while admitted and while on anti epileptic drug. Neurology consulted and recommended continuous EEG. -Neurology recommendations: continuous EEG -Continue lamotrigine, Ativan  Acute metabolic encephalopathy Acute toxic encephalopathy Multifactorial and thought to be secondary to dehydration/AKI, seizures and polypharmacy. Appears to have resolved with change in home medication regimen and treatment of AKI.  AKI Baseline creatinine of about 1. Creatinine of 1.31 on admission. Resolved.  Hyperkalemia Likely related to AKI. Mild. Resolved.  Diabetes mellitus type 2 Controlled with current hemoglobin A1C of 5.8%. -Continue Semglee and SSI, although may need to consider discontinuing insulin if she develops hypoglycemia  Primary hypertension -Continue amlodipine and Coreg  Hyperlipidemia -Continue Crestor and Zetia  Hypothyroidism -Restart home Synthroid  Tobacco use Noted.  COPD -Continue Duoneb PRN  Depression Anxiety -Continue Zoloft and Ativan   DVT prophylaxis: Lovenox Code Status:   Code Status: Full Code Family Communication: Niece at bedside Disposition Plan: Discharge to SNF (if patient agreeable) vs home likely in 2-3 days pending neurology recommendations   Consultants:  Neurology  Procedures:  EEG  Antimicrobials: None     Subjective: Patient reports a tremor of her upper extremities. Otherwise, no concerns.  Objective: BP 122/79 (BP Location: Right Arm)   Pulse 69   Temp 99.3 F (37.4 C) (Oral)   Resp 18   Ht 4\' 11"  (1.499 m)   Wt 70.4 kg   SpO2 99%   BMI 31.35 kg/m   Examination:  General exam: Appears calm and comfortable Respiratory system: Clear to auscultation. Respiratory effort normal. Cardiovascular system: S1 & S2 heard, RRR. No murmurs, rubs, gallops or clicks. Gastrointestinal system: Abdomen is nondistended, soft and nontender. No organomegaly or masses felt. Normal bowel sounds heard. Central nervous system: Alert and oriented. Resting tremor noted of left thumb which intensifies with movement. Mild tremor of right arm/hand with movement Musculoskeletal: No edema. No calf tenderness Skin: No cyanosis. No rashes Psychiatry: Judgement and insight appear normal. Mood & affect appropriate.    Data Reviewed: I have personally reviewed following labs and imaging studies  CBC Lab Results  Component Value Date   WBC 6.5 01/15/2023   RBC 4.51 01/15/2023   HGB 10.0 (L) 01/15/2023   HCT 33.6 (L) 01/15/2023   MCV 74.5 (L) 01/15/2023   MCH 22.2 (L) 01/15/2023   PLT 263 01/15/2023   MCHC 29.8 (L) 01/15/2023   RDW 14.7 01/15/2023   LYMPHSABS 0.8 01/12/2023   MONOABS 0.2 01/12/2023   EOSABS 0.0 01/12/2023   BASOSABS 0.0 01/12/2023     Last metabolic panel Lab Results  Component Value Date   NA 139 01/18/2023   K 4.2 01/18/2023   CL 110 01/18/2023   CO2 22 01/18/2023   BUN 19 01/18/2023   CREATININE 1.07 (H) 01/19/2023   GLUCOSE 119 (H) 01/18/2023   GFRNONAA 55 (L) 01/19/2023   GFRAA 72 12/11/2019   CALCIUM 9.1 01/18/2023   PHOS 2.9 01/13/2023   PROT  6.5 01/18/2023   ALBUMIN 3.6 01/18/2023   LABGLOB 2.4 06/29/2022   AGRATIO 1.8 06/29/2022   BILITOT 0.4 01/18/2023   ALKPHOS 64 01/18/2023   AST 21 01/18/2023   ALT 17 01/18/2023   ANIONGAP 7 01/18/2023     GFR: Estimated Creatinine Clearance: 40.6 mL/min (A) (by C-G formula based on SCr of 1.07 mg/dL (H)).  Recent Results (from the past 240 hour(s))  Urine Culture     Status: None   Collection Time: 01/12/23  9:23 PM   Specimen: Urine, Catheterized  Result Value Ref Range Status   Specimen Description   Final    URINE, CATHETERIZED Performed at [facility], [address], [place], [place] [address]    Special Requests   Final    NONE Performed at [facility], [address] [place], [place] [address]    Culture   Final    NO GROWTH Performed at [facility], [address] [place], [place] [address]    Report Status 01/14/2023 FINAL  Final  MRSA Next Gen by PCR, Nasal     Status: None   Collection Time: 01/17/23 10:45 AM   Specimen: Nasal Mucosa; Nasal Swab  Result Value Ref Range Status   MRSA by PCR Next Gen NOT DETECTED NOT DETECTED Final    Comment: Performed at [facility], [address], [place], [place] [address]      Radiology Studies: No results found.    LOS: 3 days    Jacquelin Hawking, MD [facility] 01/19/2023, 1:22 PM   If 7PM-7AM, please contact night-coverage [email or web address]

## 2023-01-19 NOTE — TOC Progression Note (Signed)
Transition of Care [facility]) - Progression Note    Patient Details  Name: [name] MRN: [id number] Date of Birth: [birth year]  Transition of [facility] CM/SW Contact  Baldemar [place], [place] Phone Number: 01/19/2023, 11:48 AM  Clinical Narrative:   CSW met with patient and niece at bedside per request. Niece would like to review other options for SNF, no longer sure if they'd like to choose [facility]. CSW provided list of bed offers, family to review. CSW to follow.    Expected Discharge Plan: Skilled Nursing Facility Barriers to Discharge: Continued Medical Work up  Expected Discharge Plan and Services     Post Acute Care Choice: Home Health Living arrangements for the past 2 months: Single Family Home                           HH Arranged: PT, OT, Social Work, Refused SNF HH Agency: [facility] Date [facility] Agency Contacted: 01/13/23 Time HH Agency Contacted: 1240 Representative spoke with at [facility] Agency: Cindie   Social Determinants of Health (SDOH) Interventions SDOH Screenings   Food Insecurity: No Food Insecurity (01/12/2023)  Housing: Patient Declined (01/12/2023)  Transportation Needs: No Transportation Needs (01/12/2023)  Utilities: Not At Risk (01/12/2023)  Alcohol Screen: Low Risk  (07/17/2022)  Depression (PHQ2-9): Low Risk  (12/12/2022)  Financial Resource Strain: Low Risk  (07/17/2022)  Physical Activity: Insufficiently Active (07/17/2022)  Social Connections: Moderately Integrated (07/17/2022)  Stress: No Stress Concern Present (07/17/2022)  Tobacco Use: High Risk (01/13/2023)  Health Literacy: Adequate Health Literacy (12/14/2022)    Readmission Risk Interventions    08/21/2022   11:54 AM 09/16/2021   10:07 AM  Readmission Risk Prevention Plan  Transportation Screening Complete Complete  Medication Review (RN Care Manager) Complete Complete  PCP or Specialist appointment within 3-5 days of discharge Complete   HRI or Home Care Consult Complete  Complete  SW Recovery Care/Counseling Consult Complete Complete  Palliative Care Screening Not Applicable Not Applicable  Skilled Nursing Facility Not Applicable Patient Refused

## 2023-01-19 NOTE — Plan of Care (Signed)

## 2023-01-19 NOTE — Progress Notes (Signed)
Subjective: No further seizure-like episodes overnight.  ROS: negative except above  Examination  Vital signs in last 24 hours: Temp:  [97.7 F (36.5 C)-99.2 F (37.3 C)] 99.2 F (37.3 C) (11/08 0931) Pulse Rate:  [64-72] 67 (11/08 0931) Resp:  [15-26] 16 (11/08 0306) BP: (133-149)/(45-81) 140/63 (11/08 0931) SpO2:  [97 %-100 %] 97 % (11/08 0931)  General: lying in bed, NAD  Neuro: MS: Alert, oriented, follows commands CN: pupils equal and reactive,  EOMI, face symmetric, tongue midline, normal sensation over face, Motor: 5/5 strength in all 4 extremities Coordination: normal Gait: not tested  Basic Metabolic Panel: Recent Labs  Lab 01/13/23 0512 01/14/23 0422 01/15/23 0458 01/15/23 1150 01/17/23 1023 01/18/23 0451 01/19/23 0529  NA 138 137 136 136 137 139  --   K 4.5 4.6 3.6 4.0 3.7 4.2  --   CL 107 105 105 104 103 110  --   CO2 23 24 23 23 23 22   --   GLUCOSE 121* 144* 139* 160* 140* 119*  --   BUN 26* 19 24* 26* 28* 19  --   CREATININE 1.00 0.88 0.99 1.06* 1.16* 0.95 1.07*  CALCIUM 9.8 9.3 9.4 9.5 9.2 9.1  --   MG 1.8 2.0 1.9  --  2.1 2.0  --   PHOS 2.9  --   --   --   --   --   --     CBC: Recent Labs  Lab 01/12/23 1706 01/13/23 0512 01/15/23 0458 01/15/23 1150  WBC 6.2 7.9 6.1 6.5  NEUTROABS 5.0  --   --   --   HGB 11.0* 10.4* 10.0* 10.0*  HCT 38.4 35.9* 34.2* 33.6*  MCV 76.5* 75.6* 74.7* 74.5*  PLT 295 292 255 263     Coagulation Studies: No results for input(s): "LABPROT", "INR" in the last 72 hours.  Imaging No new brain imaging overnight   ASSESSMENT AND PLAN:72yo F with ho meningioma, seizures noted to have an episode of alteration of awareness for few minutes followed bu gradual return to baseline.    Transient alteration of awareness - Pregabalin and ativan were held due to ams. As these meds also have anti-seizure properties, I suspect patient had breakthrough seizure. Other ddx include behavioral vs delirium    Recommendations: -Continue lamotrigine 150 mg twice daily -DC Keppra as Ativan has been resumed -Video EEG monitoring to look for any intermittent seizures - Consider resuming pregabalin at lower dose if tolerated - seizure and delirium precautions - prn iv versed for clinical seizure   Thank you for allowing [name] to participate in the care of this patient. If you have any further questions, please contact  me or neurohospitalist.    I have spent a total of  36  minutes with the patient reviewing hospital notes,  test results, labs and examining the patient as well as establishing an assessment and plan that was discussed personally with the patient.  > 50% of time was spent in direct patient care.     Lindie Spruce Epilepsy [facility] For questions after 5pm please refer to AMION to reach the Neurologist on call

## 2023-01-19 NOTE — Plan of Care (Signed)
  Problem: Education: Goal: Knowledge of General Education information will improve Description: Including pain rating scale, medication(s)/side effects and non-pharmacologic comfort measures Outcome: Progressing   Problem: Health Behavior/Discharge Planning: Goal: Ability to manage health-related needs will improve 01/19/2023 1712 by Avie Arenas, RN Outcome: Progressing 01/19/2023 1709 by Avie Arenas, RN Outcome: Progressing   Problem: Clinical Measurements: Goal: Ability to maintain clinical measurements within normal limits will improve 01/19/2023 1712 by Avie Arenas, RN Outcome: Progressing 01/19/2023 1709 by Avie Arenas, RN Outcome: Progressing Goal: Will remain free from infection Outcome: Progressing

## 2023-01-19 NOTE — Care Management Important Message (Signed)
Important Message  Patient Details  Name: [name] MRN: [id number] Date of Birth: [birth year]   Important Message Given:  Yes - Medicare IM     Dorena Bodo 01/19/2023, 3:15 PM

## 2023-01-19 NOTE — Progress Notes (Signed)
LTM EEG hooked up and running - no initial skin breakdown - push button tested - [facility] monitoring.  GMD/CP

## 2023-01-20 DIAGNOSIS — G40909 Epilepsy, unspecified, not intractable, without status epilepticus: Secondary | ICD-10-CM | POA: Diagnosis not present

## 2023-01-20 DIAGNOSIS — N179 Acute kidney failure, unspecified: Secondary | ICD-10-CM | POA: Diagnosis not present

## 2023-01-20 DIAGNOSIS — J449 Chronic obstructive pulmonary disease, unspecified: Secondary | ICD-10-CM | POA: Diagnosis not present

## 2023-01-20 DIAGNOSIS — G9341 Metabolic encephalopathy: Secondary | ICD-10-CM | POA: Diagnosis not present

## 2023-01-20 DIAGNOSIS — R569 Unspecified convulsions: Secondary | ICD-10-CM | POA: Diagnosis not present

## 2023-01-20 LAB — GLUCOSE, CAPILLARY
Glucose-Capillary: 137 mg/dL — ABNORMAL HIGH (ref 70–99)
Glucose-Capillary: 147 mg/dL — ABNORMAL HIGH (ref 70–99)
Glucose-Capillary: 133 mg/dL — ABNORMAL HIGH (ref 70–99)
Glucose-Capillary: 130 mg/dL — ABNORMAL HIGH (ref 70–99)
Glucose-Capillary: 152 mg/dL — ABNORMAL HIGH (ref 70–99)
Glucose-Capillary: 145 mg/dL — ABNORMAL HIGH (ref 70–99)

## 2023-01-20 IMAGING — US US CAROTID DUPLEX BILAT
1 series · 14 of 24 positions shown · non-contrast
Comparison: 02/10/2020

CLINICAL DATA: Carotid artery stenosis follow-up

EXAM:
BILATERAL CAROTID DUPLEX ULTRASOUND
TECHNIQUE: Gray scale imaging, color Doppler and duplex ultrasound were
performed of bilateral carotid and vertebral arteries in the neck.

[Series 1: us carotid bilateral · 14 of 69 slices shown]
[im 1/69]
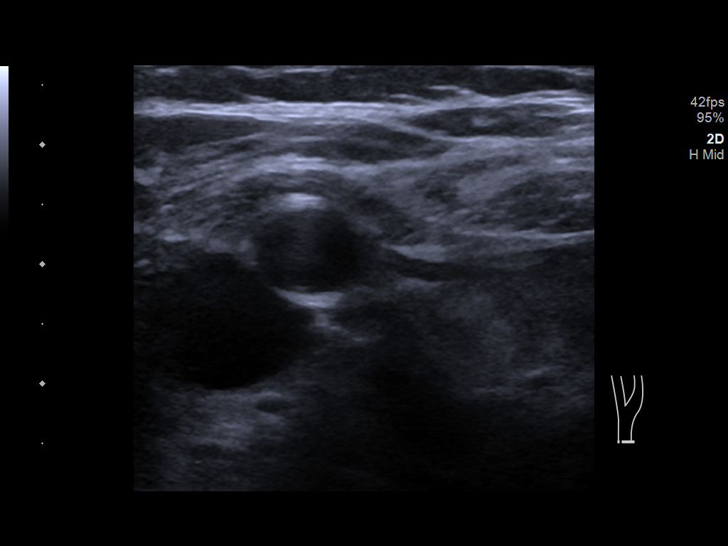
[im 6/69]
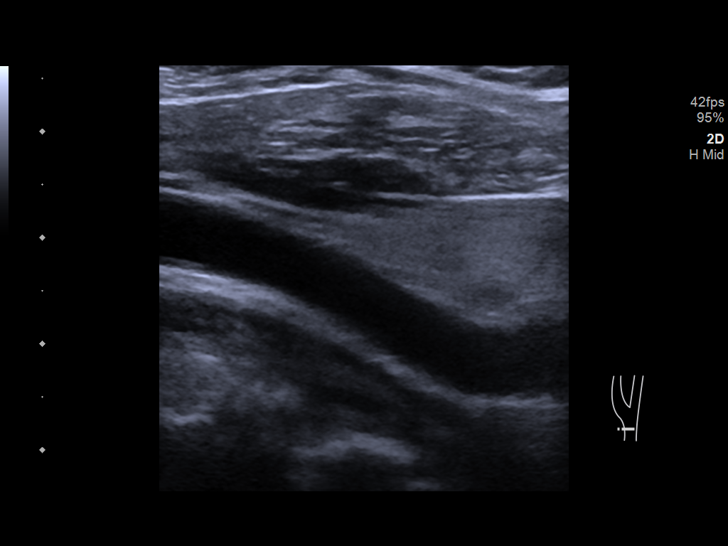
[im 12/69]
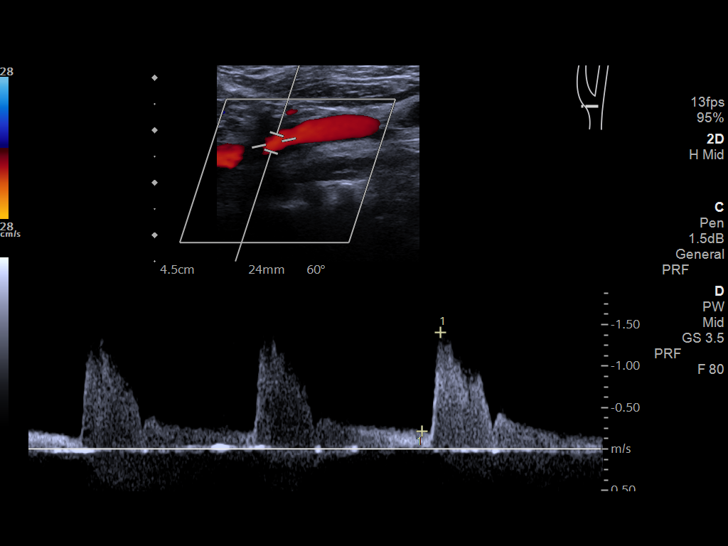
[im 18/69]
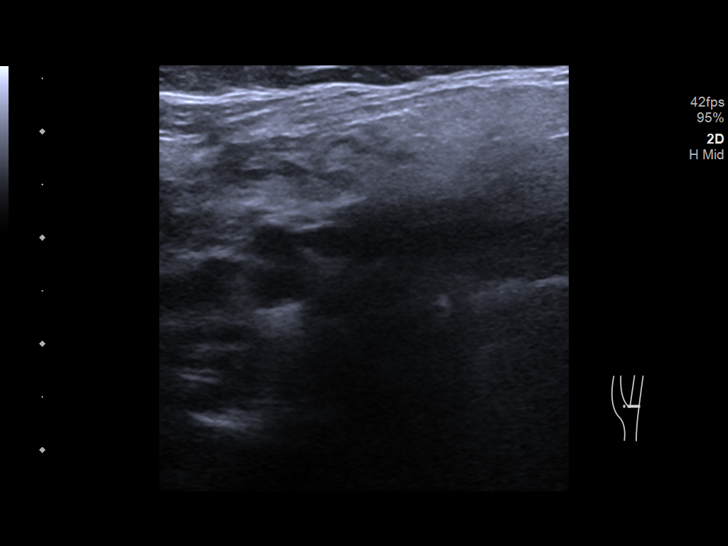
[im 21/69]
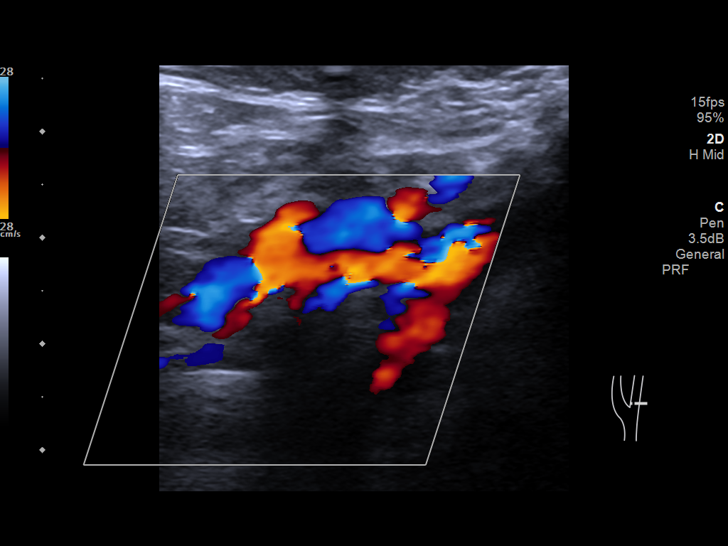
[im 27/69]
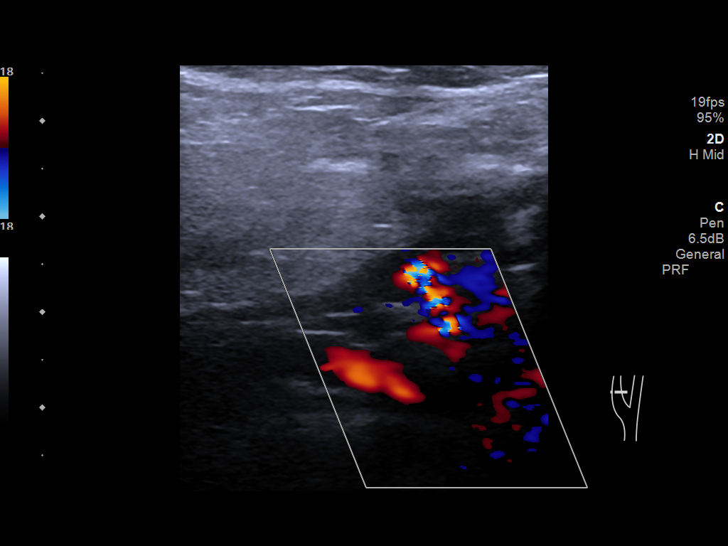
[im 33/69]
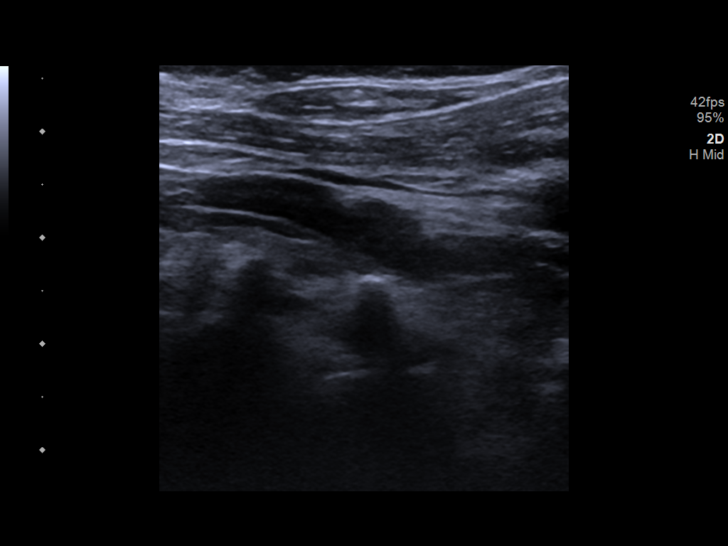
[im 36/69]
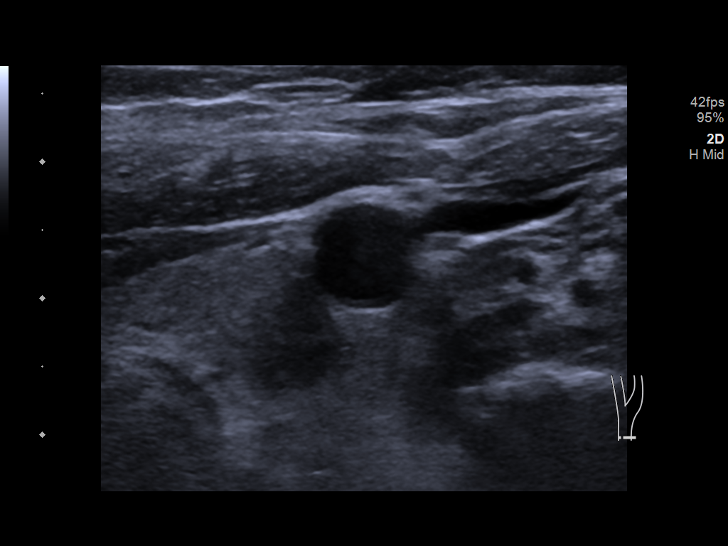
[im 42/69]
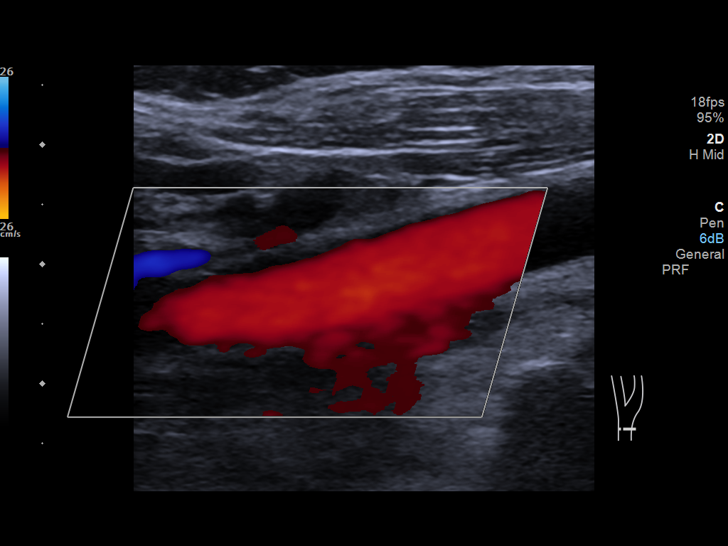
[im 48/69]
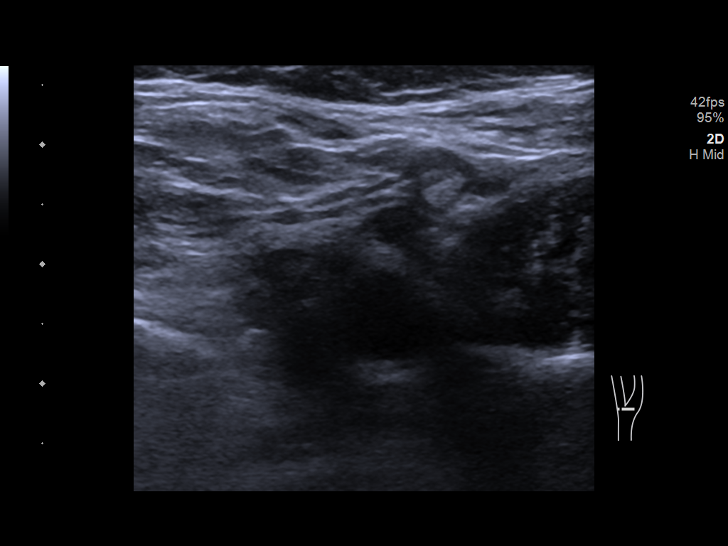
[im 54/69]
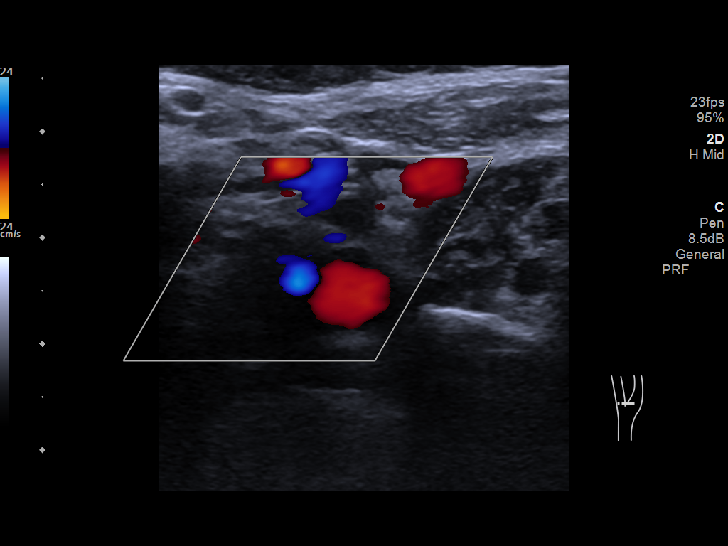
[im 57/69]
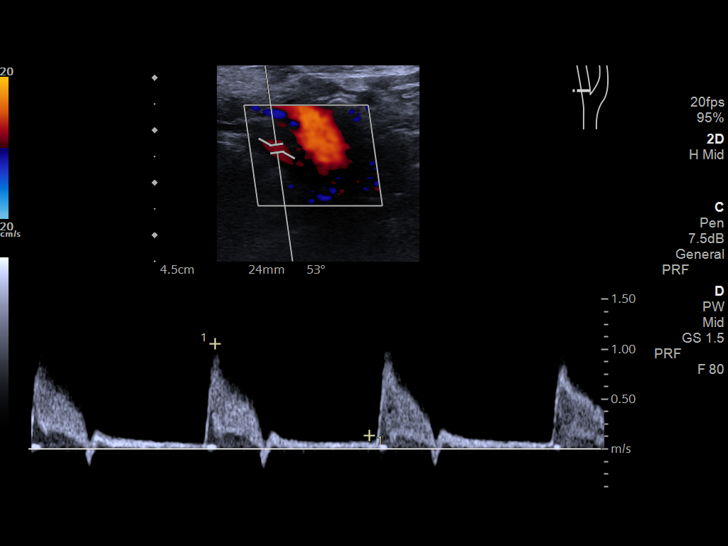
[im 63/69]
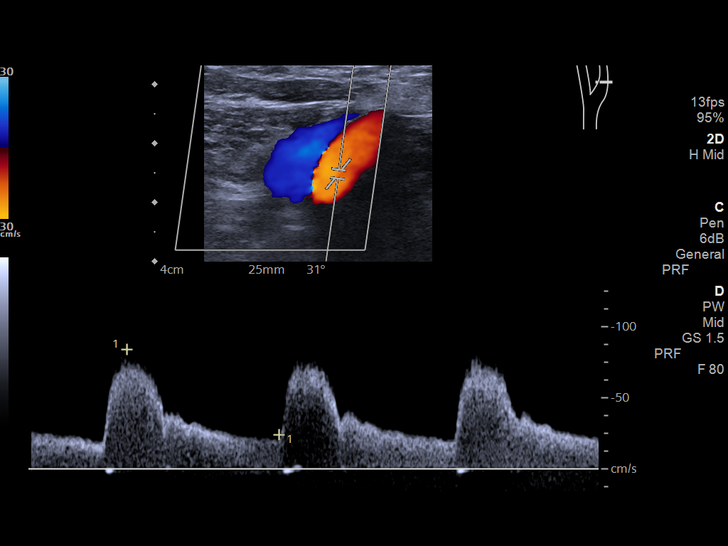
[im 69/69]
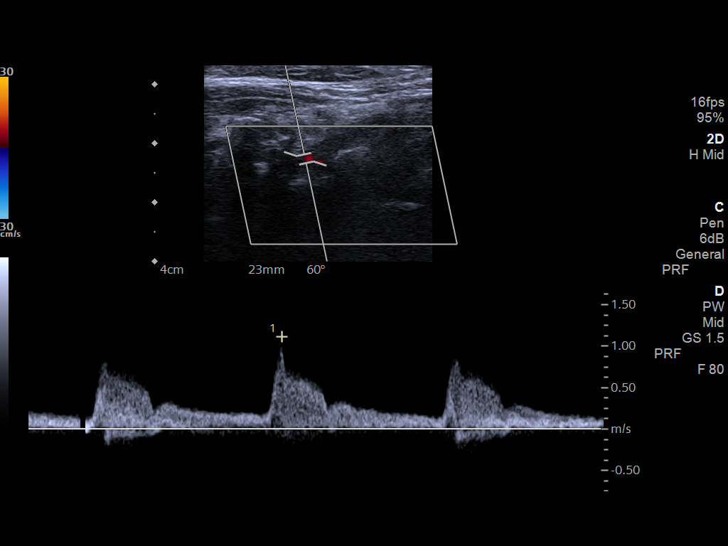

[14 of 24 positions shown; findings below may reference images not displayed]

FINDINGS: Criteria: Quantification of carotid stenosis is based on velocity
parameters that correlate the residual internal carotid diameter
with NASCET-based stenosis levels, using the diameter of the distal
internal carotid lumen as the denominator for stenosis measurement.

The following velocity measurements were obtained:

RIGHT

ICA: 104/21 cm/sec

CCA: 128/15 cm/sec

SYSTOLIC ICA/CCA RATIO:

ECA: 127 cm/sec

LEFT

ICA: 123/33 cm/sec

CCA: 116/23 cm/sec

SYSTOLIC ICA/CCA RATIO:

ECA: 105 cm/sec

RIGHT CAROTID ARTERY: No significant atheromatous plaque.

RIGHT VERTEBRAL ARTERY:  Antegrade flow.

LEFT CAROTID ARTERY: Mild heterogeneous plaque of the left ICA
origin.

LEFT VERTEBRAL ARTERY:  Antegrade flow.
IMPRESSION: No significant stenosis of internal carotid arteries.

## 2023-01-20 NOTE — Procedures (Addendum)
Patient Name: [name]  MRN: [id number]  Epilepsy Attending: Charlsie Quest  Referring Physician/Provider: Charlsie Quest, MD  Duration: 01/19/2023 1238 to 01/20/2023 1208   Patient history: 72yo F with ams getting eeg to evaluate for seizure   Level of alertness: awake, asleep   AEDs during EEG study: Ativan, LTG   Technical aspects: This EEG study was done with scalp electrodes positioned according to the 10-20 International system of electrode placement. Electrical activity was reviewed with band pass filter of 1-70Hz , sensitivity of 7 uV/mm, display speed of 70mm/sec with a 60Hz  notched filter applied as appropriate. EEG data were recorded continuously and digitally stored.  Video monitoring was available and reviewed as appropriate.   Description: The posterior dominant rhythm consists of 8 Hz activity of moderate voltage (25-35 uV) seen predominantly in posterior head regions, symmetric and reactive to eye opening and eye closing. Sleep was characterized by vertex waves, sleep spindles (12 to 14 Hz), maximal frontocentral region. Hyperventilation and photic stimulation were not performed.      IMPRESSION: This study is within normal limits. No seizures or epileptiform discharges were seen throughout the recording.   A normal interictal EEG does not exclude the diagnosis of epilepsy.   Shundra Wirsing Annabelle Harman

## 2023-01-20 NOTE — Plan of Care (Signed)
  Problem: Education: Goal: Knowledge of General Education information will improve Description: Including pain rating scale, medication(s)/side effects and non-pharmacologic comfort measures Outcome: Progressing   Problem: Clinical Measurements: Goal: Ability to maintain clinical measurements within normal limits will improve Outcome: Progressing Goal: Will remain free from infection Outcome: Progressing   Problem: Nutrition: Goal: Adequate nutrition will be maintained Outcome: Progressing   

## 2023-01-20 NOTE — Plan of Care (Signed)
  Problem: Education: Goal: Knowledge of General Education information will improve Description: Including pain rating scale, medication(s)/side effects and non-pharmacologic comfort measures 01/20/2023 1900 by Avie Arenas, RN Outcome: Progressing 01/20/2023 1759 by Avie Arenas, RN Outcome: Progressing   Problem: Health Behavior/Discharge Planning: Goal: Ability to manage health-related needs will improve Outcome: Progressing   Problem: Clinical Measurements: Goal: Ability to maintain clinical measurements within normal limits will improve Outcome: Progressing Goal: Will remain free from infection Outcome: Progressing   Problem: Nutrition: Goal: Adequate nutrition will be maintained Outcome: Progressing

## 2023-01-20 NOTE — Plan of Care (Signed)
  Problem: Clinical Measurements: Goal: Will remain free from infection Outcome: Progressing   Problem: Activity: Goal: Risk for activity intolerance will decrease Outcome: Progressing   Problem: Nutrition: Goal: Adequate nutrition will be maintained Outcome: Progressing   Problem: Elimination: Goal: Will not experience complications related to bowel motility Outcome: Progressing Goal: Will not experience complications related to urinary retention Outcome: Progressing   Problem: Pain Management: Goal: General experience of comfort will improve Outcome: Progressing   Problem: Safety: Goal: Ability to remain free from injury will improve Outcome: Progressing   Problem: Metabolic: Goal: Ability to maintain appropriate glucose levels will improve Outcome: Progressing

## 2023-01-20 NOTE — Progress Notes (Signed)
PROGRESS NOTE    [name]  YQM:[id number] DOB: 1950-08-19 DOA: 01/12/2023 PCP: Kerri Perches, MD   Brief Narrative: [name] is a 72 y.o. female with a history of diabetes mellitus type 2, hypertension, hyperlipidemia, COPD, seizure, stroke, hypothyroidism, tobacco use.  Patient presented secondary to urinary frequency and altered mental status and was found to have evidence of AKI, toxic/metabolic encephalopathy and breakthrough seizures. Home medications were adjusted and AKI treated with improvement of mental status.    Assessment and Plan:  Seizure disorder Breakthrough seizures Patient with seizure activity while admitted and while on anti epileptic drug. Neurology consulted and recommended continuous EEG. -Neurology recommendations: continuous EEG -Continue lamotrigine, Ativan  Acute metabolic encephalopathy Acute toxic encephalopathy Multifactorial and thought to be secondary to dehydration/AKI, seizures and polypharmacy. Appears to have resolved with change in home medication regimen and treatment of AKI.  AKI Baseline creatinine of about 1. Creatinine of 1.31 on admission. Resolved.  Hyperkalemia Likely related to AKI. Mild. Resolved.  Diabetes mellitus type 2 Controlled with current hemoglobin A1C of 5.8%. -Continue Semglee and SSI, although may need to consider discontinuing insulin if she develops hypoglycemia  Primary hypertension -Continue amlodipine and Coreg  Hyperlipidemia -Continue Crestor and Zetia  Hypothyroidism -Continue home Synthroid  Tobacco use Noted.  COPD -Continue Duoneb PRN  Depression Anxiety -Continue Zoloft and Ativan   DVT prophylaxis: Lovenox Code Status:   Code Status: Full Code Family Communication: Niece at bedside Disposition Plan: Discharge to SNF (if patient agreeable) vs home likely in 2-3 days pending neurology recommendations   Consultants:  Neurology  Procedures:  EEG  Antimicrobials: None     Subjective: Per nursing, some confusion overnight. Patient feels better this morning. No concerns.  Objective: BP 126/63 (BP Location: Right Arm)   Pulse 77   Temp 98.3 F (36.8 C) (Oral)   Resp 16   Ht 4\' 11"  (1.499 m)   Wt 70.4 kg   SpO2 96%   BMI 31.35 kg/m   Examination:  General exam: Appears calm and comfortable Respiratory system: Clear to auscultation. Respiratory effort normal. Cardiovascular system: S1 & S2 heard, RRR. No murmurs, rubs, gallops or clicks. Gastrointestinal system: Abdomen is nondistended, soft and nontender. Normal bowel sounds heard. Central nervous system: Alert and oriented. No focal neurological deficits. No tremor. Musculoskeletal: No edema. No calf tenderness Skin: No cyanosis. No rashes Psychiatry: Judgement and insight appear normal. Mood & affect appropriate.    Data Reviewed: I have personally reviewed following labs and imaging studies  CBC Lab Results  Component Value Date   WBC 6.5 01/15/2023   RBC 4.51 01/15/2023   HGB 10.0 (L) 01/15/2023   HCT 33.6 (L) 01/15/2023   MCV 74.5 (L) 01/15/2023   MCH 22.2 (L) 01/15/2023   PLT 263 01/15/2023   MCHC 29.8 (L) 01/15/2023   RDW 14.7 01/15/2023   LYMPHSABS 0.8 01/12/2023   MONOABS 0.2 01/12/2023   EOSABS 0.0 01/12/2023   BASOSABS 0.0 01/12/2023     Last metabolic panel Lab Results  Component Value Date   NA 139 01/18/2023   K 4.2 01/18/2023   CL 110 01/18/2023   CO2 22 01/18/2023   BUN 19 01/18/2023   CREATININE 1.07 (H) 01/19/2023   GLUCOSE 119 (H) 01/18/2023   GFRNONAA 55 (L) 01/19/2023   GFRAA 72 12/11/2019   CALCIUM 9.1 01/18/2023   PHOS 2.9 01/13/2023   PROT 6.5 01/18/2023   ALBUMIN 3.6 01/18/2023   LABGLOB 2.4 06/29/2022   AGRATIO  1.8 06/29/2022   BILITOT 0.4 01/18/2023   ALKPHOS 64 01/18/2023   AST 21 01/18/2023   ALT 17 01/18/2023   ANIONGAP 7 01/18/2023    GFR: Estimated Creatinine Clearance: 40.6 mL/min (A) (by C-G formula based on SCr of 1.07 mg/dL  (H)).  Recent Results (from the past 240 hour(s))  Urine Culture     Status: None   Collection Time: 01/12/23  9:23 PM   Specimen: Urine, Catheterized  Result Value Ref Range Status   Specimen Description   Final    URINE, CATHETERIZED Performed at [facility], [address] [place], [place] [address]    Special Requests   Final    NONE Performed at [facility], [address] [place], [place] [address]    Culture   Final    NO GROWTH Performed at [facility], [address], [place], [place] [address]    Report Status 01/14/2023 FINAL  Final  MRSA Next Gen by PCR, Nasal     Status: None   Collection Time: 01/17/23 10:45 AM   Specimen: Nasal Mucosa; Nasal Swab  Result Value Ref Range Status   MRSA by PCR Next Gen NOT DETECTED NOT DETECTED Final    Comment: Performed at [facility], [address] [place], [place] [address]      Radiology Studies: No results found.    LOS: 4 days    Jacquelin Hawking, MD [facility] 01/20/2023, 8:53 AM   If 7PM-7AM, please contact night-coverage [email or web address]

## 2023-01-20 NOTE — Progress Notes (Signed)
vLTM D/C.  No skin breakdown noted at all skin sites.  [facility] notified

## 2023-01-20 NOTE — Progress Notes (Signed)
LTM maint complete - no skin breakdown under:  DG3,OV5,I4,P3

## 2023-01-20 NOTE — Progress Notes (Signed)
Neurology plan of care  Per primary team AMS resolved with tx of AKI and holding deliriogenic medications. LTM d/c'd. Continue keppra and ativan current doses. Continue holding pregabalin as this was favored to be contributing to AMS. Neurology will be available prn going forward.  Bing Neighbors, MD [facility] [phone]  If 7pm- 7am, please [facility] on call as listed in AMION.

## 2023-01-21 DIAGNOSIS — N179 Acute kidney failure, unspecified: Secondary | ICD-10-CM | POA: Diagnosis not present

## 2023-01-21 DIAGNOSIS — G9341 Metabolic encephalopathy: Secondary | ICD-10-CM | POA: Diagnosis not present

## 2023-01-21 DIAGNOSIS — G40909 Epilepsy, unspecified, not intractable, without status epilepticus: Secondary | ICD-10-CM | POA: Diagnosis not present

## 2023-01-21 DIAGNOSIS — J449 Chronic obstructive pulmonary disease, unspecified: Secondary | ICD-10-CM | POA: Diagnosis not present

## 2023-01-21 LAB — GLUCOSE, CAPILLARY
Glucose-Capillary: 102 mg/dL — ABNORMAL HIGH (ref 70–99)
Glucose-Capillary: 139 mg/dL — ABNORMAL HIGH (ref 70–99)
Glucose-Capillary: 106 mg/dL — ABNORMAL HIGH (ref 70–99)
Glucose-Capillary: 148 mg/dL — ABNORMAL HIGH (ref 70–99)

## 2023-01-21 MED ORDER — HALOPERIDOL LACTATE 5 MG/ML IJ SOLN
1.0000 mg | Freq: Four times a day (QID) | INTRAMUSCULAR | Status: DC | PRN
  Administered 2023-01-21: 1 mg via INTRAVENOUS
  Filled 2023-01-21: qty 1

## 2023-01-21 NOTE — Progress Notes (Signed)
PROGRESS NOTE    [name]  WUJ:[id number] DOB: 1950-11-16 DOA: 01/12/2023 PCP: Kerri Perches, MD   Brief Narrative: [name] is a 72 y.o. female with a history of diabetes mellitus type 2, hypertension, hyperlipidemia, COPD, seizure, stroke, hypothyroidism, tobacco use.  Patient presented secondary to urinary frequency and altered mental status and was found to have evidence of AKI, toxic/metabolic encephalopathy and breakthrough seizures. Home medications were adjusted and AKI treated with improvement of mental status.   Assessment and Plan:  Seizure disorder Breakthrough seizures Patient with seizure activity while admitted and while on anti epileptic drug. Neurology consulted and recommended continuous EEG. -Neurology recommendations: continuous EEG -Continue lamotrigine, Ativan  Acute metabolic encephalopathy Acute toxic encephalopathy Multifactorial and thought to be secondary to dehydration/AKI, seizures and polypharmacy. Appears to have resolved with change in home medication regimen and treatment of AKI.  AKI Baseline creatinine of about 1. Creatinine of 1.31 on admission. Resolved.  Hyperkalemia Likely related to AKI. Mild. Resolved.  Diabetes mellitus type 2 Controlled with current hemoglobin A1C of 5.8%. -Continue Semglee and SSI, although may need to consider discontinuing insulin if she develops hypoglycemia  Primary hypertension -Continue amlodipine and Coreg  Hyperlipidemia -Continue Crestor and Zetia  Hypothyroidism -Continue home Synthroid  Tobacco use Noted.  COPD -Continue Duoneb PRN  Depression Anxiety -Continue Zoloft and Ativan   DVT prophylaxis: Lovenox Code Status:   Code Status: Full Code Family Communication: None at bedside. Niece via telephone Disposition Plan: Discharge to SNF (if patient agreeable) vs home likely in 1-3 days pending neurology recommendations   Consultants:  Neurology  Procedures:   EEG  Antimicrobials: None    Subjective: Continues to have intermittent confusion. Patient initially stated she felt better, but then become irritable, seemingly related to itching from feeling hot. No other concerns.  Objective: BP (!) 156/62 (BP Location: Right Arm)   Pulse 73   Temp 98.4 F (36.9 C) (Oral)   Resp 16   Ht 4\' 11"  (1.499 m)   Wt 70.4 kg   SpO2 100%   BMI 31.35 kg/m   Examination:  General exam: Appears calm and comfortable Respiratory system: Clear to auscultation. Respiratory effort normal. Cardiovascular system: S1 & S2 heard, RRR. No murmurs. Gastrointestinal system: Abdomen is nondistended, soft and nontender. Normal bowel sounds heard. Central nervous system: Alert and oriented to self.   Data Reviewed: I have personally reviewed following labs and imaging studies  CBC Lab Results  Component Value Date   WBC 6.5 01/15/2023   RBC 4.51 01/15/2023   HGB 10.0 (L) 01/15/2023   HCT 33.6 (L) 01/15/2023   MCV 74.5 (L) 01/15/2023   MCH 22.2 (L) 01/15/2023   PLT 263 01/15/2023   MCHC 29.8 (L) 01/15/2023   RDW 14.7 01/15/2023   LYMPHSABS 0.8 01/12/2023   MONOABS 0.2 01/12/2023   EOSABS 0.0 01/12/2023   BASOSABS 0.0 01/12/2023     Last metabolic panel Lab Results  Component Value Date   NA 139 01/18/2023   K 4.2 01/18/2023   CL 110 01/18/2023   CO2 22 01/18/2023   BUN 19 01/18/2023   CREATININE 1.07 (H) 01/19/2023   GLUCOSE 119 (H) 01/18/2023   GFRNONAA 55 (L) 01/19/2023   GFRAA 72 12/11/2019   CALCIUM 9.1 01/18/2023   PHOS 2.9 01/13/2023   PROT 6.5 01/18/2023   ALBUMIN 3.6 01/18/2023   LABGLOB 2.4 06/29/2022   AGRATIO 1.8 06/29/2022   BILITOT 0.4 01/18/2023   ALKPHOS 64 01/18/2023  AST 21 01/18/2023   ALT 17 01/18/2023   ANIONGAP 7 01/18/2023    GFR: Estimated Creatinine Clearance: 40.6 mL/min (A) (by C-G formula based on SCr of 1.07 mg/dL (H)).  Recent Results (from the past 240 hour(s))  Urine Culture     Status: None    Collection Time: 01/12/23  9:23 PM   Specimen: Urine, Catheterized  Result Value Ref Range Status   Specimen Description   Final    URINE, CATHETERIZED Performed at [facility], [address] [place], [place] [address]    Special Requests   Final    NONE Performed at [facility], [address], [place], [place] [address]    Culture   Final    NO GROWTH Performed at [facility], [address] [place], [place] [address]    Report Status 01/14/2023 FINAL  Final  MRSA Next Gen by PCR, Nasal     Status: None   Collection Time: 01/17/23 10:45 AM   Specimen: Nasal Mucosa; Nasal Swab  Result Value Ref Range Status   MRSA by PCR Next Gen NOT DETECTED NOT DETECTED Final    Comment: Performed at [facility], [address] [place], [place] [address]      Radiology Studies: Overnight EEG with video  Result Date: 01/20/2023 Charlsie Quest, MD     01/21/2023  9:27 AM Patient Name: [name] MRN: [id number] Epilepsy Attending: Charlsie Quest Referring Physician/Provider: Charlsie Quest, MD Duration: 01/19/2023 1238 to 01/20/2023 1208  Patient history: 72yo F with ams getting eeg to evaluate for seizure  Level of alertness: awake, asleep  AEDs during EEG study: Ativan, LTG  Technical aspects: This EEG study was done with scalp electrodes positioned according to the 10-20 International system of electrode placement. Electrical activity was reviewed with band pass filter of 1-70Hz , sensitivity of 7 uV/mm, display speed of 79mm/sec with a 60Hz  notched filter applied as appropriate. EEG data were recorded continuously and digitally stored.  Video monitoring was available and reviewed as appropriate.  Description: The posterior dominant rhythm consists of 8 Hz activity of moderate voltage (25-35 uV) seen predominantly in posterior head regions, symmetric and reactive to eye opening and eye closing. Sleep was characterized by vertex waves, sleep spindles (12 to 14 Hz), maximal  frontocentral region. Hyperventilation and photic stimulation were not performed.    IMPRESSION: This study is within normal limits. No seizures or epileptiform discharges were seen throughout the recording.  A normal interictal EEG does not exclude the diagnosis of epilepsy.  Charlsie Quest       LOS: 5 days    Jacquelin Hawking, MD [facility] 01/21/2023, 11:50 AM   If 7PM-7AM, please contact night-coverage [email or web address]

## 2023-01-22 ENCOUNTER — Ambulatory Visit: Payer: Medicare HMO | Admitting: Podiatry

## 2023-01-22 DIAGNOSIS — G9341 Metabolic encephalopathy: Secondary | ICD-10-CM | POA: Diagnosis not present

## 2023-01-22 DIAGNOSIS — J449 Chronic obstructive pulmonary disease, unspecified: Secondary | ICD-10-CM | POA: Diagnosis not present

## 2023-01-22 DIAGNOSIS — G40909 Epilepsy, unspecified, not intractable, without status epilepticus: Secondary | ICD-10-CM | POA: Diagnosis not present

## 2023-01-22 DIAGNOSIS — N179 Acute kidney failure, unspecified: Secondary | ICD-10-CM | POA: Diagnosis not present

## 2023-01-22 LAB — GLUCOSE, CAPILLARY
Glucose-Capillary: 132 mg/dL — ABNORMAL HIGH (ref 70–99)
Glucose-Capillary: 157 mg/dL — ABNORMAL HIGH (ref 70–99)
Glucose-Capillary: 151 mg/dL — ABNORMAL HIGH (ref 70–99)
Glucose-Capillary: 117 mg/dL — ABNORMAL HIGH (ref 70–99)

## 2023-01-22 MED ORDER — MELATONIN 3 MG PO TABS: 6.0000 mg | ORAL_TABLET | Freq: Every day | ORAL | Status: DC

## 2023-01-22 MED ORDER — MELATONIN 3 MG PO TABS
6.0000 mg | ORAL_TABLET | Freq: Every day | ORAL | Status: DC
  Administered 2023-01-22: 6 mg via ORAL
  Filled 2023-01-22: qty 2

## 2023-01-22 NOTE — Plan of Care (Signed)
  Problem: Health Behavior/Discharge Planning: Goal: Ability to manage health-related needs will improve Outcome: Progressing   

## 2023-01-22 NOTE — Progress Notes (Signed)
PROGRESS NOTE    [name]  JYN:[id number] DOB: Aug 09, 1950 DOA: 01/12/2023 PCP: [name] Perches, MD   Brief Narrative: [name] is a 72 y.o. female with a history of diabetes mellitus type 2, hypertension, hyperlipidemia, COPD, seizure, stroke, hypothyroidism, tobacco use.  Patient presented secondary to urinary frequency and altered mental status and was found to have evidence of AKI, toxic/metabolic encephalopathy and breakthrough seizures. Home medications were adjusted and AKI treated with improvement of mental status.   Assessment and Plan:  Seizure disorder Breakthrough seizures Patient with seizure activity while admitted and while on anti epileptic drug. Neurology consulted and recommended continuous EEG; no seizure on EEG. -Neurology recommendations: signed off on 11/9 when mental status has improved but available PRN -Continue lamotrigine, Ativan  Acute metabolic encephalopathy Acute toxic encephalopathy Multifactorial and thought to be secondary to dehydration/AKI, seizures and polypharmacy. Appears to have improved with change in home medication regimen and treatment of AKI. Still with episodes with delirium, however, which could be related to poor sleep. -Melatonin at bedtime -Delirium precautions  AKI Baseline creatinine of about 1. Creatinine of 1.31 on admission. Resolved.  Hyperkalemia Likely related to AKI. Mild. Resolved.  Diabetes mellitus type 2 Controlled with current hemoglobin A1C of 5.8%. -Continue Semglee and SSI, although may need to consider discontinuing insulin if she develops hypoglycemia  Primary hypertension -Continue amlodipine and Coreg  Hyperlipidemia -Continue Crestor and Zetia  Hypothyroidism -Continue home Synthroid  Tobacco use Noted.  COPD -Continue Duoneb PRN  Depression Anxiety -Continue Zoloft and Ativan   DVT prophylaxis: Lovenox Code Status:   Code Status: Full Code Family Communication: None at  bedside.  Disposition Plan: Discharge to SNF likely in 1-2 days pending improvement in confusion   Consultants:  Neurology  Procedures:  EEG  Antimicrobials: None    Subjective: Patient states she is hungry. No other concerns this morning.  Objective: BP (!) 132/55 (BP Location: Left Arm)   Pulse 89   Temp 99.3 F (37.4 C) (Oral)   Resp 18   Ht 4\' 11"  (1.499 m)   Wt 70.4 kg   SpO2 98%   BMI 31.35 kg/m   Examination:  General exam: Appears calm and comfortable Respiratory system: Clear to auscultation. Respiratory effort normal. Cardiovascular system: S1 & S2 heard, RRR. No murmurs. Gastrointestinal system: Abdomen is nondistended, soft and nontender. Normal bowel sounds heard. Central nervous system: Alert and oriented to self.   Data Reviewed: I have personally reviewed following labs and imaging studies  CBC Lab Results  Component Value Date   WBC 6.5 01/15/2023   RBC 4.51 01/15/2023   HGB 10.0 (L) 01/15/2023   HCT 33.6 (L) 01/15/2023   MCV 74.5 (L) 01/15/2023   MCH 22.2 (L) 01/15/2023   PLT 263 01/15/2023   MCHC 29.8 (L) 01/15/2023   RDW 14.7 01/15/2023   LYMPHSABS 0.8 01/12/2023   MONOABS 0.2 01/12/2023   EOSABS 0.0 01/12/2023   BASOSABS 0.0 01/12/2023     Last metabolic panel Lab Results  Component Value Date   NA 139 01/18/2023   K 4.2 01/18/2023   CL 110 01/18/2023   CO2 22 01/18/2023   BUN 19 01/18/2023   CREATININE 1.07 (H) 01/19/2023   GLUCOSE 119 (H) 01/18/2023   GFRNONAA 55 (L) 01/19/2023   GFRAA 72 12/11/2019   CALCIUM 9.1 01/18/2023   PHOS 2.9 01/13/2023   PROT 6.5 01/18/2023   ALBUMIN 3.6 01/18/2023   LABGLOB 2.4 06/29/2022   AGRATIO 1.8 06/29/2022  BILITOT 0.4 01/18/2023   ALKPHOS 64 01/18/2023   AST 21 01/18/2023   ALT 17 01/18/2023   ANIONGAP 7 01/18/2023    GFR: Estimated Creatinine Clearance: 40.6 mL/min (A) (by C-G formula based on SCr of 1.07 mg/dL (H)).  Recent Results (from the past 240 hour(s))  Urine  Culture     Status: None   Collection Time: 01/12/23  9:23 PM   Specimen: Urine, Catheterized  Result Value Ref Range Status   Specimen Description   Final    URINE, CATHETERIZED Performed at [facility], [address], [place], [place] [address]    Special Requests   Final    NONE Performed at [facility], [address] [place], [place] [address]    Culture   Final    NO GROWTH Performed at [facility], [address], [place], [place] [address]    Report Status 01/14/2023 FINAL  Final  MRSA Next Gen by PCR, Nasal     Status: None   Collection Time: 01/17/23 10:45 AM   Specimen: Nasal Mucosa; Nasal Swab  Result Value Ref Range Status   MRSA by PCR Next Gen NOT DETECTED NOT DETECTED Final    Comment: Performed at [facility], [address], [place], [place] [address]      Radiology Studies: Overnight EEG with video  Result Date: 01/20/2023 [name] Quest, MD     01/21/2023  9:27 AM Patient Name: [name] MRN: [id number] Epilepsy Attending: Charlsie [name] Referring Physician/Provider: Charlsie Quest, MD Duration: 01/19/2023 1238 to 01/20/2023 1208  Patient history: 72yo F with ams getting eeg to evaluate for seizure  Level of alertness: awake, asleep  AEDs during EEG study: Ativan, LTG  Technical aspects: This EEG study was done with scalp electrodes positioned according to the 10-20 International system of electrode placement. Electrical activity was reviewed with band pass filter of 1-70Hz , sensitivity of 7 uV/mm, display speed of 67mm/sec with a 60Hz  notched filter applied as appropriate. EEG data were recorded continuously and digitally stored.  Video monitoring was available and reviewed as appropriate.  Description: The posterior dominant rhythm consists of 8 Hz activity of moderate voltage (25-35 uV) seen predominantly in posterior head regions, symmetric and reactive to eye opening and eye closing. Sleep was characterized by vertex waves, sleep spindles (12 to  14 Hz), maximal frontocentral region. Hyperventilation and photic stimulation were not performed.    IMPRESSION: This study is within normal limits. No seizures or epileptiform discharges were seen throughout the recording.  A normal interictal EEG does not exclude the diagnosis of epilepsy.  [name]       LOS: 6 days    Jacquelin Hawking, MD [facility] 01/22/2023, 7:55 AM   If 7PM-7AM, please contact night-coverage [email or web address]

## 2023-01-22 NOTE — Progress Notes (Signed)
At the shift change, pt very confused and agitated, constantly attempting to get out of the bed. She is also attempting to grab staff and pull.  Pt stated" he is in my room trying to get me, and he is after everyone". VS stable, no signs of acute distress. Doctor Segars notified and new orders placed.

## 2023-01-22 NOTE — Progress Notes (Signed)
Pt with intermittent agitation throughout the night, needed to be redirected and reoriented. She is frequently attempting to get out the bed and leave.  Pt refused her medication at the first, but RN was able to convince pt to take seizure medication. Pt verbalized understanding of importance of taking seizure meds, and agreed to take pills, but refused Senakot and Crestor.  Will continue to monitor pt.

## 2023-01-22 NOTE — TOC Progression Note (Signed)
Transition of Care [facility]) - Progression Note    Patient Details  Name: [name] MRN: [id number] Date of Birth: 1950-10-20  Transition of Care [facility]) CM/SW Contact  Erin Sons, [place] Phone Number: 01/22/2023, 2:49 PM  Clinical Narrative:     CSW called pt's niece, Merdis Delay, to discuss SNF choice. Niece would like to move forward with [facility]. CSW updated [facility] admissions. TOC will initiate SNF authorization once a more recent therapy note is available.  Expected Discharge Plan: Skilled Nursing Facility Barriers to Discharge: Continued Medical Work up  Expected Discharge Plan and Services     Post Acute Care Choice: Home Health Living arrangements for the past 2 months: Single Family Home                           HH Arranged: PT, OT, Social Work, Refused SNF HH Agency: [facility] Date [facility] Agency Contacted: 01/13/23 Time HH Agency Contacted: 1240 Representative spoke with at [facility] Agency: Cindie   Social Determinants of Health (SDOH) Interventions SDOH Screenings   Food Insecurity: No Food Insecurity (01/12/2023)  Housing: Patient Declined (01/12/2023)  Transportation Needs: No Transportation Needs (01/12/2023)  Utilities: Not At Risk (01/12/2023)  Alcohol Screen: Low Risk  (07/17/2022)  Depression (PHQ2-9): Low Risk  (12/12/2022)  Financial Resource Strain: Low Risk  (07/17/2022)  Physical Activity: Insufficiently Active (07/17/2022)  Social Connections: Moderately Integrated (07/17/2022)  Stress: No Stress Concern Present (07/17/2022)  Tobacco Use: High Risk (01/13/2023)  Health Literacy: Adequate Health Literacy (12/14/2022)    Readmission Risk Interventions    08/21/2022   11:54 AM 09/16/2021   10:07 AM  Readmission Risk Prevention Plan  Transportation Screening Complete Complete  Medication Review (RN Care Manager) Complete Complete  PCP or Specialist appointment within 3-5 days of discharge Complete   HRI or Home Care Consult Complete Complete   SW Recovery Care/Counseling Consult Complete Complete  Palliative Care Screening Not Applicable Not Applicable  Skilled Nursing Facility Not Applicable Patient Refused

## 2023-01-22 NOTE — Progress Notes (Signed)
Restrains never initiated, pt needed frequent redirecting to stay in the bed.

## 2023-01-23 DIAGNOSIS — G9341 Metabolic encephalopathy: Secondary | ICD-10-CM | POA: Diagnosis not present

## 2023-01-23 DIAGNOSIS — N179 Acute kidney failure, unspecified: Secondary | ICD-10-CM | POA: Diagnosis not present

## 2023-01-23 DIAGNOSIS — J449 Chronic obstructive pulmonary disease, unspecified: Secondary | ICD-10-CM | POA: Diagnosis not present

## 2023-01-23 DIAGNOSIS — G40909 Epilepsy, unspecified, not intractable, without status epilepticus: Secondary | ICD-10-CM | POA: Diagnosis not present

## 2023-01-23 LAB — COMPREHENSIVE METABOLIC PANEL
ALT: 22 U/L (ref 0–44)
AST: 24 U/L (ref 15–41)
Albumin: 3.8 g/dL (ref 3.5–5.0)
Alkaline Phosphatase: 72 U/L (ref 38–126)
Anion gap: 10 (ref 5–15)
BUN: 30 mg/dL — ABNORMAL HIGH (ref 8–23)
CO2: 24 mmol/L (ref 22–32)
Calcium: 9.5 mg/dL (ref 8.9–10.3)
Chloride: 104 mmol/L (ref 98–111)
Creatinine, Ser: 1.46 mg/dL — ABNORMAL HIGH (ref 0.44–1.00)
GFR, Estimated: 38 mL/min — ABNORMAL LOW (ref >60)
Glucose, Bld: 108 mg/dL — ABNORMAL HIGH (ref 70–99)
Potassium: 3.8 mmol/L (ref 3.5–5.1)
Sodium: 138 mmol/L (ref 135–145)
Total Bilirubin: 0.4 mg/dL (ref <1.2)
Total Protein: 6.9 g/dL (ref 6.5–8.1)

## 2023-01-23 LAB — CBC
HCT: 33 % — ABNORMAL LOW (ref 36.0–46.0)
Hemoglobin: 9.7 g/dL — ABNORMAL LOW (ref 12.0–15.0)
MCH: 21.5 pg — ABNORMAL LOW (ref 26.0–34.0)
MCHC: 29.4 g/dL — ABNORMAL LOW (ref 30.0–36.0)
MCV: 73 fL — ABNORMAL LOW (ref 80.0–100.0)
Platelets: 292 10*3/uL (ref 150–400)
RBC: 4.52 MIL/uL (ref 3.87–5.11)
RDW: 15.3 % (ref 11.5–15.5)
WBC: 8.2 10*3/uL (ref 4.0–10.5)
nRBC: 0 % (ref 0.0–0.2)

## 2023-01-23 LAB — GLUCOSE, CAPILLARY
Glucose-Capillary: 148 mg/dL — ABNORMAL HIGH (ref 70–99)
Glucose-Capillary: 130 mg/dL — ABNORMAL HIGH (ref 70–99)
Glucose-Capillary: 111 mg/dL — ABNORMAL HIGH (ref 70–99)
Glucose-Capillary: 199 mg/dL — ABNORMAL HIGH (ref 70–99)
Glucose-Capillary: 109 mg/dL — ABNORMAL HIGH (ref 70–99)

## 2023-01-23 MED ORDER — MELATONIN 3 MG PO TABS: 3.0000 mg | ORAL_TABLET | Freq: Every evening | ORAL | Status: DC | PRN

## 2023-01-23 MED ORDER — SODIUM CHLORIDE 0.9 % IV SOLN: INTRAVENOUS | Status: AC

## 2023-01-23 NOTE — Plan of Care (Signed)
  Problem: Education: Goal: Knowledge of General Education information will improve Description: Including pain rating scale, medication(s)/side effects and non-pharmacologic comfort measures Outcome: Progressing   Problem: Health Behavior/Discharge Planning: Goal: Ability to manage health-related needs will improve Outcome: Progressing   Problem: Clinical Measurements: Goal: Ability to maintain clinical measurements within normal limits will improve Outcome: Progressing Goal: Will remain free from infection Outcome: Progressing   Problem: Activity: Goal: Risk for activity intolerance will decrease Outcome: Progressing   Problem: Nutrition: Goal: Adequate nutrition will be maintained Outcome: Not Progressing

## 2023-01-23 NOTE — Progress Notes (Signed)
PROGRESS NOTE    [name]  VHQ:[id number] DOB: 1950/12/08 DOA: 01/12/2023 PCP: Kerri Perches, MD   Brief Narrative: [name] is a 72 y.o. female with a history of diabetes mellitus type 2, hypertension, hyperlipidemia, COPD, seizure, stroke, hypothyroidism, tobacco use.  Patient presented secondary to urinary frequency and altered mental status and was found to have evidence of AKI, toxic/metabolic encephalopathy and breakthrough seizures. Home medications were adjusted and AKI treated with improvement of mental status.   Assessment and Plan:  Seizure disorder Breakthrough seizures Patient with seizure activity while admitted and while on anti epileptic drug. Neurology consulted and recommended continuous EEG; no seizure on EEG. -Neurology recommendations: signed off on 11/9 when mental status has improved but available PRN -Continue lamotrigine, Ativan  Acute metabolic encephalopathy Acute toxic encephalopathy Multifactorial and thought to be secondary to dehydration/AKI, seizures and polypharmacy. Appears to have improved with change in home medication regimen and treatment of AKI. Still with episodes with delirium, however, which could be related to poor sleep. Patient now very somnolent after melatonin. -Decrease melatonin dose to 3 mg and change to as needed -Check metabolic panel -Delirium precautions  AKI Baseline creatinine of about 1. Creatinine of 1.31 on admission. Resolved.  Hyperkalemia Likely related to AKI. Mild. Resolved.  Diabetes mellitus type 2 Controlled with current hemoglobin A1C of 5.8%. -Continue Semglee and SSI, although may need to consider discontinuing insulin if she develops hypoglycemia  Primary hypertension -Continue amlodipine and Coreg  Hyperlipidemia -Continue Crestor and Zetia  Hypothyroidism -Continue home Synthroid  Chronic anemia Stable.  Tobacco use Noted.  COPD -Continue Duoneb  PRN  Depression Anxiety -Continue Zoloft and Ativan   DVT prophylaxis: Lovenox Code Status:   Code Status: Full Code Family Communication: None at bedside. Son via telephone Disposition Plan: Discharge to SNF likely in 1-2 days pending improvement in confusion   Consultants:  Neurology  Procedures:  EEG  Antimicrobials: None    Subjective: Patient without specific concern but is somnolent. She had trouble with swallowing her pills this morning.  Objective: BP 102/64 (BP Location: Left Arm)   Pulse 80   Temp 97.7 F (36.5 C) (Oral)   Resp (!) 24   Ht 4\' 11"  (1.499 m)   Wt 70.4 kg   SpO2 98%   BMI 31.35 kg/m   Examination:  General exam: Appears calm and comfortable Respiratory system: Clear to auscultation. Respiratory effort normal. Cardiovascular system: S1 & S2 heard, RRR. No murmurs. Gastrointestinal system: Abdomen is nondistended, soft and nontender. Normal bowel sounds heard. Central nervous system: Somnolent but easily arousable. No focal neurological deficits noted. Tremor of upper extremities when woken up. Musculoskeletal: No edema. No calf tenderness   Data Reviewed: I have personally reviewed following labs and imaging studies  CBC Lab Results  Component Value Date   WBC 6.5 01/15/2023   RBC 4.51 01/15/2023   HGB 10.0 (L) 01/15/2023   HCT 33.6 (L) 01/15/2023   MCV 74.5 (L) 01/15/2023   MCH 22.2 (L) 01/15/2023   PLT 263 01/15/2023   MCHC 29.8 (L) 01/15/2023   RDW 14.7 01/15/2023   LYMPHSABS 0.8 01/12/2023   MONOABS 0.2 01/12/2023   EOSABS 0.0 01/12/2023   BASOSABS 0.0 01/12/2023     Last metabolic panel Lab Results  Component Value Date   NA 139 01/18/2023   K 4.2 01/18/2023   CL 110 01/18/2023   CO2 22 01/18/2023   BUN 19 01/18/2023   CREATININE 1.07 (H) 01/19/2023  GLUCOSE 119 (H) 01/18/2023   GFRNONAA 55 (L) 01/19/2023   GFRAA 72 12/11/2019   CALCIUM 9.1 01/18/2023   PHOS 2.9 01/13/2023   PROT 6.5 01/18/2023   ALBUMIN  3.6 01/18/2023   LABGLOB 2.4 06/29/2022   AGRATIO 1.8 06/29/2022   BILITOT 0.4 01/18/2023   ALKPHOS 64 01/18/2023   AST 21 01/18/2023   ALT 17 01/18/2023   ANIONGAP 7 01/18/2023    GFR: Estimated Creatinine Clearance: 40.6 mL/min (A) (by C-G formula based on SCr of 1.07 mg/dL (H)).  Recent Results (from the past 240 hour(s))  MRSA Next Gen by PCR, Nasal     Status: None   Collection Time: 01/17/23 10:45 AM   Specimen: Nasal Mucosa; Nasal Swab  Result Value Ref Range Status   MRSA by PCR Next Gen NOT DETECTED NOT DETECTED Final    Comment: Performed at [facility], [address] [place], [place] [address]      Radiology Studies: No results found.    LOS: 7 days    Jacquelin Hawking, MD [facility] 01/23/2023, 11:44 AM   If 7PM-7AM, please contact night-coverage [email or web address]

## 2023-01-23 NOTE — Telephone Encounter (Signed)
Hen I reviewed her record , noted was that she was somnolent so I deferred calling. Will discuss health condition once she is back at home

## 2023-01-23 NOTE — Evaluation (Signed)
Occupational Therapy Evaluation Patient Details Name: [name] MRN: [id number] DOB: 12/04/1950 Today's Date: 01/23/2023   History of Present Illness Pt is a 72 yo female admitted 11/1 to [facility] for AMS, urinary frequency, AKI, encephalopathy, and breakthrough seizure, transferred to [facility] 11/7. PMHx: HTN, HLD, GERD, COPD, hypothyroidism, seizures, CVA, T2DM, glaucoma, PVD, PAF, PPM   Clinical Impression   [name] was evaluated s/p the above admission list. She lives alone, has care givers 5 days/wk for 8 hrs/day, is ambulatory with AD in the home and needs assist for ADLs at baseline. Upon evaluation the pt was limited by dizziness, poor balance, BUE tremor impaired cognition, generalized weakness and poor activity tolerance. Overall she needed GCA for bed mobility and min A +2 for simple transfers, pt declined further mobility due to report of dizziness. Pt stating that she is dizzy all of the time, even in supine, and positional changes do not make it worse. Due to the deficits listed below the pt also needs up to max A for LB ADLs and min A for UB ADLS. Pt will benefit from continued acute OT services and skilled inpatient follow up therapy, <3 hours/day - of note, pt stating she wants to d/c home.         If plan is discharge home, recommend the following: A little help with walking and/or transfers;A lot of help with bathing/dressing/bathroom;Assistance with cooking/housework;Direct supervision/assist for medications management;Direct supervision/assist for financial management;Assist for transportation;Help with stairs or ramp for entrance       Equipment Recommendations  None recommended by OT       Precautions / Restrictions Precautions Precautions: Fall Restrictions Weight Bearing Restrictions: No      Mobility Bed Mobility Overal bed mobility: Needs Assistance Bed Mobility: Supine to Sit, Sit to Supine     Supine to sit: Contact guard Sit to supine: Contact guard  assist   General bed mobility comments: impulsively getting back into bed    Transfers Overall transfer level: Needs assistance Equipment used: Rolling walker (2 wheels) Transfers: Sit to/from Stand Sit to Stand: Min assist, +2 safety/equipment, +2 physical assistance           General transfer comment: pt declined ambulation due to dizziness      Balance Overall balance assessment: Needs assistance Sitting-balance support: No upper extremity supported Sitting balance-Leahy Scale: Fair     Standing balance support: During functional activity Standing balance-Leahy Scale: Fair               ADL either performed or assessed with clinical judgement   ADL Overall ADL's : Needs assistance/impaired Eating/Feeding: Minimal assistance;Sitting Eating/Feeding Details (indicate cue type and reason): limited due to bilat UE tremors Grooming: Minimal assistance   Upper Body Bathing: Minimal assistance   Lower Body Bathing: Maximal assistance;Sit to/from stand   Upper Body Dressing : Minimal assistance;Sitting   Lower Body Dressing: Maximal assistance;Sit to/from stand   Toilet Transfer: Maximal assistance;Stand-pivot   Toileting- Clothing Manipulation and Hygiene: Maximal assistance;Sit to/from stand       Functional mobility during ADLs: Minimal assistance;+2 for physical assistance;+2 for safety/equipment;Rolling walker (2 wheels) General ADL Comments: limited by dizziness, activity tolerance, BUE tremors     Vision Baseline Vision/History: 1 Wears glasses Vision Assessment?: No apparent visual deficits Additional Comments: did not formally assess     Perception Perception: Not tested       Praxis Praxis: Not tested       Pertinent Vitals/Pain Pain Assessment Pain Assessment: No/denies  pain     Extremity/Trunk Assessment Upper Extremity Assessment Upper Extremity Assessment: Generalized weakness;RUE deficits/detail;LUE deficits/detail RUE Deficits /  Details: rests with wrist in extreme flexion, denies sensation changes. globally 4/5. coordination is slowed and deliberate. tremors LUE Deficits / Details: globally 4/5, able to likfe over head, tremors with functional activity   Lower Extremity Assessment Lower Extremity Assessment: Overall WFL for tasks assessed   Cervical / Trunk Assessment Cervical / Trunk Assessment: Kyphotic   Communication Communication Communication: No apparent difficulties   Cognition Arousal: Alert Behavior During Therapy: Impulsive Overall Cognitive Status: Impaired/Different from baseline Area of Impairment: Attention, Following commands, Awareness, Safety/judgement, Problem solving                   Current Attention Level: Sustained   Following Commands: Follows one step commands consistently Safety/Judgement: Decreased awareness of deficits Awareness: Emergent Problem Solving: Requires verbal cues General Comments: Limited insight to deficits and safety implications. perseverating on dizziness throughout. slightly impulsive with movement     General Comments  VSS, family present            Home Living Family/patient expects to be discharged to:: Private residence Living Arrangements: Alone Available Help at Discharge: Personal care attendant;Available PRN/intermittently;Family;Friend(s) Type of Home: Apartment Home Access: Level entry     Home Layout: One level     Bathroom Shower/Tub: Producer, television/film/video: Handicapped height Bathroom Accessibility: Yes   Home Equipment: Agricultural consultant (2 wheels);Cane - single point;BSC/3in1;Hand held shower head;Grab bars - toilet;Grab bars - tub/shower;Cane - quad;Rollator (4 wheels);Shower seat - built in;Wheelchair - manual;Hospital bed   Additional Comments: PCA 5 day/wk 8 hr/day      Prior Functioning/Environment Prior Level of Function : Needs assist             Mobility Comments: household ambulator using  Rollator most of time, sometimes single point cane, uses electric carts in grocery stores ADLs Comments: home aides 8 hours/day x 5 days/week; two different caregivers on weekend days.        OT Problem List: Decreased strength;Decreased range of motion;Decreased activity tolerance;Impaired balance (sitting and/or standing);Decreased cognition;Decreased knowledge of use of DME or AE;Decreased safety awareness         OT Goals(Current goals can be found in the care plan section) Acute Rehab OT Goals Patient Stated Goal: home OT Goal Formulation: With patient Time For Goal Achievement: 02/06/23 Potential to Achieve Goals: Good ADL Goals Pt Will Perform Eating: Independently Pt Will Perform Grooming: with set-up;sitting Pt Will Perform Lower Body Dressing: with min assist;sit to/from stand Pt Will Transfer to Toilet: with contact guard assist;stand pivot transfer;bedside commode  OT Frequency: Min 1X/week    Co-evaluation PT/OT/SLP Co-Evaluation/Treatment: Yes Reason for Co-Treatment: To address functional/ADL transfers   OT goals addressed during session: ADL's and self-care      AM-PAC OT "6 Clicks" Daily Activity     Outcome Measure Help from another person eating meals?: A Little Help from another person taking care of personal grooming?: A Little Help from another person toileting, which includes using toliet, bedpan, or urinal?: A Lot Help from another person bathing (including washing, rinsing, drying)?: A Lot Help from another person to put on and taking off regular upper body clothing?: A Little Help from another person to put on and taking off regular lower body clothing?: A Lot 6 Click Score: 15   End of Session Equipment Utilized During Treatment: Rolling walker (2 wheels) Nurse Communication: Mobility status  Activity Tolerance: Patient tolerated treatment well Patient left: in bed;with call bell/phone within reach;with bed alarm set;with family/visitor  present  OT Visit Diagnosis: Unsteadiness on feet (R26.81);Other abnormalities of gait and mobility (R26.89);Repeated falls (R29.6);Muscle weakness (generalized) (M62.81);History of falling (Z91.81);Dizziness and giddiness (R42)                Time: 1430-1450 OT Time Calculation (min): 20 min Charges:  OT General Charges $OT Visit: 1 Visit OT Evaluation $OT Eval Moderate Complexity: 1 Mod  Derenda Mis, OTR/L Acute Rehabilitation Services Office [phone] Secure Chat Communication Preferred   Donia Pounds 01/23/2023, 3:27 PM

## 2023-01-23 NOTE — Progress Notes (Signed)
Physical Therapy Treatment Patient Details Name: [name] MRN: [id number] DOB: 06-Dec-1950 Today's Date: 01/23/2023   History of Present Illness Pt is a 72 yo female admitted 11/1 to [facility] for AMS, urinary frequency, AKI, encephalopathy, and breakthrough seizure, transferred to [facility] 11/7. PMHx: HTN, HLD, GERD, COPD, hypothyroidism, seizures, CVA, T2DM, glaucoma, PVD, PAF, PPM    PT Comments  Patient seen after transfer from [facility]. Alert and slightly impulsive with movements. Reporting spinning dizziness at rest and with movement. No nystagmus noted. Patient did not feel she could ambulate due to dizziness. Stood with RW with +2 min assist x 30 seconds. Sat EOB ~5 minutes and returned self to supine. Patient indicating she wants to get well enough to go home, however per chart family wants pt to go to SNF to recover as pt lives alone with aides to assist her.    If plan is discharge home, recommend the following: A lot of help with walking and/or transfers;A lot of help with bathing/dressing/bathroom;Assistance with cooking/housework;Direct supervision/assist for medications management;Direct supervision/assist for financial management;Assist for transportation   Can travel by private vehicle        Equipment Recommendations  None recommended by PT    Recommendations for Other Services       Precautions / Restrictions Precautions Precautions: Fall Restrictions Weight Bearing Restrictions: No     Mobility  Bed Mobility Overal bed mobility: Needs Assistance Bed Mobility: Supine to Sit, Sit to Supine     Supine to sit: Contact guard Sit to supine: Contact guard assist   General bed mobility comments: impulsively getting back into bed    Transfers Overall transfer level: Needs assistance Equipment used: Rolling walker (2 wheels) Transfers: Sit to/from Stand Sit to Stand: Min assist, +2 safety/equipment, +2 physical assistance           General  transfer comment: pt declined ambulation due to dizziness    Ambulation/Gait               General Gait Details: pt refused due to dizziness/spinning   Stairs             Wheelchair Mobility     Tilt Bed    Modified Rankin (Stroke Patients Only)       Balance Overall balance assessment: Needs assistance Sitting-balance support: No upper extremity supported Sitting balance-Leahy Scale: Fair     Standing balance support: During functional activity Standing balance-Leahy Scale: Fair                              Cognition Arousal: Alert Behavior During Therapy: Impulsive Overall Cognitive Status: Impaired/Different from baseline Area of Impairment: Attention, Following commands, Awareness, Safety/judgement, Problem solving                   Current Attention Level: Sustained   Following Commands: Follows one step commands consistently Safety/Judgement: Decreased awareness of deficits Awareness: Emergent Problem Solving: Requires verbal cues General Comments: Limited insight to deficits and safety implications. perseverating on dizziness throughout. slightly impulsive with movement        Exercises      General Comments General comments (skin integrity, edema, etc.): VSS, family present      Pertinent Vitals/Pain Pain Assessment Pain Assessment: No/denies pain    Home Living Family/patient expects to be discharged to:: Private residence Living Arrangements: Alone Available Help at Discharge: Personal care attendant;Available PRN/intermittently;Family;Friend(s) Type of Home: Apartment Home Access:  Level entry       Home Layout: One level Home Equipment: Agricultural consultant (2 wheels);Cane - single point;BSC/3in1;Hand held shower head;Grab bars - toilet;Grab bars - tub/shower;Cane - quad;Rollator (4 wheels);Shower seat - built in;Wheelchair - manual;Hospital bed Additional Comments: PCA 5 day/wk 8 hr/day    Prior Function             PT Goals (current goals can now be found in the care plan section) Acute Rehab PT Goals Patient Stated Goal: Go home Time For Goal Achievement: 01/27/23 Potential to Achieve Goals: Good Progress towards PT goals: Progressing toward goals    Frequency    Min 1X/week      PT Plan      Co-evaluation PT/OT/SLP Co-Evaluation/Treatment: Yes Reason for Co-Treatment: To address functional/ADL transfers PT goals addressed during session: Mobility/safety with mobility;Balance;Proper use of DME OT goals addressed during session: ADL's and self-care      AM-PAC PT "6 Clicks" Mobility   Outcome Measure  Help needed turning from your back to your side while in a flat bed without using bedrails?: A Little Help needed moving from lying on your back to sitting on the side of a flat bed without using bedrails?: A Little Help needed moving to and from a bed to a chair (including a wheelchair)?: A Lot Help needed standing up from a chair using your arms (e.g., wheelchair or bedside chair)?: A Lot Help needed to walk in hospital room?: Total Help needed climbing 3-5 steps with a railing? : Total 6 Click Score: 12    End of Session Equipment Utilized During Treatment: Gait belt Activity Tolerance: Treatment limited secondary to medical complications (Comment) (limited by dizziness/vertigo) Patient left: in bed;with bed alarm set;with family/visitor present Nurse Communication: Mobility status PT Visit Diagnosis: Unsteadiness on feet (R26.81);Muscle weakness (generalized) (M62.81)     Time: 5784-6962 PT Time Calculation (min) (ACUTE ONLY): 33 min  Charges:    $Therapeutic Activity: 8-22 mins PT General Charges $$ ACUTE PT VISIT: 1 Visit                      Jerolyn Center, PT Acute Rehabilitation Services  Office [phone]    Zena Amos 01/23/2023, 4:48 PM

## 2023-01-23 NOTE — Plan of Care (Signed)
  Problem: Education: Goal: Knowledge of General Education information will improve Description: Including pain rating scale, medication(s)/side effects and non-pharmacologic comfort measures Outcome: Progressing   Problem: Health Behavior/Discharge Planning: Goal: Ability to manage health-related needs will improve Outcome: Progressing   Problem: Clinical Measurements: Goal: Ability to maintain clinical measurements within normal limits will improve Outcome: Progressing Goal: Will remain free from infection Outcome: Progressing   Problem: Activity: Goal: Risk for activity intolerance will decrease Outcome: Progressing   Problem: Nutrition: Goal: Adequate nutrition will be maintained Outcome: Progressing   Problem: Elimination: Goal: Will not experience complications related to bowel motility Outcome: Progressing Goal: Will not experience complications related to urinary retention Outcome: Progressing   Problem: Pain Management: Goal: General experience of comfort will improve Outcome: Progressing   Problem: Safety: Goal: Ability to remain free from injury will improve Outcome: Progressing   Problem: Coping: Goal: Ability to adjust to condition or change in health will improve Outcome: Progressing   Problem: Health Behavior/Discharge Planning: Goal: Ability to identify and utilize available resources and services will improve Outcome: Progressing Goal: Ability to manage health-related needs will improve Outcome: Progressing   Problem: Metabolic: Goal: Ability to maintain appropriate glucose levels will improve Outcome: Progressing   Problem: Safety: Goal: Non-violent Restraint(s) Outcome: Progressing

## 2023-01-23 NOTE — Progress Notes (Signed)
Pt unable to swallow her meds. The meds were crushed and put into apply sauce, the pt did not want to take them, but then agreed to. She would take a small bit into her mouth and then hold it for a minute before spitting it back out. MD Nettey made aware.

## 2023-01-24 DIAGNOSIS — G9341 Metabolic encephalopathy: Secondary | ICD-10-CM | POA: Diagnosis not present

## 2023-01-24 LAB — GLUCOSE, CAPILLARY
Glucose-Capillary: 134 mg/dL — ABNORMAL HIGH (ref 70–99)
Glucose-Capillary: 132 mg/dL — ABNORMAL HIGH (ref 70–99)
Glucose-Capillary: 116 mg/dL — ABNORMAL HIGH (ref 70–99)
Glucose-Capillary: 173 mg/dL — ABNORMAL HIGH (ref 70–99)

## 2023-01-24 LAB — CBC
HCT: 32.2 % — ABNORMAL LOW (ref 36.0–46.0)
Hemoglobin: 9.5 g/dL — ABNORMAL LOW (ref 12.0–15.0)
MCH: 22.2 pg — ABNORMAL LOW (ref 26.0–34.0)
MCHC: 29.5 g/dL — ABNORMAL LOW (ref 30.0–36.0)
MCV: 75.4 fL — ABNORMAL LOW (ref 80.0–100.0)
Platelets: 263 10*3/uL (ref 150–400)
RBC: 4.27 MIL/uL (ref 3.87–5.11)
RDW: 15.5 % (ref 11.5–15.5)
WBC: 7 10*3/uL (ref 4.0–10.5)
nRBC: 0 % (ref 0.0–0.2)

## 2023-01-24 LAB — COMPREHENSIVE METABOLIC PANEL
ALT: 19 U/L (ref 0–44)
AST: 20 U/L (ref 15–41)
Albumin: 3.7 g/dL (ref 3.5–5.0)
Alkaline Phosphatase: 70 U/L (ref 38–126)
Anion gap: 10 (ref 5–15)
BUN: 26 mg/dL — ABNORMAL HIGH (ref 8–23)
CO2: 21 mmol/L — ABNORMAL LOW (ref 22–32)
Calcium: 9.1 mg/dL (ref 8.9–10.3)
Chloride: 107 mmol/L (ref 98–111)
Creatinine, Ser: 1.25 mg/dL — ABNORMAL HIGH (ref 0.44–1.00)
GFR, Estimated: 46 mL/min — ABNORMAL LOW (ref >60)
Glucose, Bld: 126 mg/dL — ABNORMAL HIGH (ref 70–99)
Potassium: 3.6 mmol/L (ref 3.5–5.1)
Sodium: 138 mmol/L (ref 135–145)
Total Bilirubin: 0.9 mg/dL (ref <1.2)
Total Protein: 6.6 g/dL (ref 6.5–8.1)

## 2023-01-24 NOTE — Plan of Care (Signed)
  Problem: Education: Goal: Knowledge of General Education information will improve Description: Including pain rating scale, medication(s)/side effects and non-pharmacologic comfort measures Outcome: Not Progressing   Problem: Health Behavior/Discharge Planning: Goal: Ability to manage health-related needs will improve Outcome: Not Progressing   Problem: Clinical Measurements: Goal: Ability to maintain clinical measurements within normal limits will improve Outcome: Progressing Goal: Will remain free from infection Outcome: Progressing   Problem: Nutrition: Goal: Adequate nutrition will be maintained Outcome: Not Progressing

## 2023-01-24 NOTE — TOC Progression Note (Addendum)
Transition of Care [facility]) - Progression Note    Patient Details  Name: [name] MRN: [id number] Date of Birth: 12/05/1950  [facility]) CM/SW Contact  Baldemar Lenis, [place] Phone Number: 01/24/2023, 11:01 AM  Clinical Narrative:   CSW requested CMA to initiate insurance authorization to admit to SNF when medically stable. CSW to follow.   UPDATE: Patient has received insurance authorization. CSW updated [facility], they do not anticipate a bed available for the patient until Friday. CSW to follow.    Expected Discharge Plan: Skilled Nursing Facility Barriers to Discharge: Continued Medical Work up  Expected Discharge Plan and Services     Post Acute Care Choice: Home Health Living arrangements for the past 2 months: Single Family Home                           HH Arranged: PT, OT, Social Work, Refused SNF HH Agency: [facility] Date [facility] Agency Contacted: 01/13/23 Time HH Agency Contacted: [phone] Representative spoke with at [facility] Agency: Cindie   Social Determinants of Health (SDOH) Interventions SDOH Screenings   Food Insecurity: No Food Insecurity (01/12/2023)  Housing: Patient Declined (01/12/2023)  Transportation Needs: No Transportation Needs (01/12/2023)  Utilities: Not At Risk (01/12/2023)  Alcohol Screen: Low Risk  (07/17/2022)  Depression (PHQ2-9): Low Risk  (12/12/2022)  Financial Resource Strain: Low Risk  (07/17/2022)  Physical Activity: Insufficiently Active (07/17/2022)  Social Connections: Moderately Integrated (07/17/2022)  Stress: No Stress Concern Present (07/17/2022)  Tobacco Use: High Risk (01/13/2023)  Health Literacy: Adequate Health Literacy (12/14/2022)    Readmission Risk Interventions    08/21/2022   11:54 AM 09/16/2021   10:07 AM  Readmission Risk Prevention Plan  Transportation Screening Complete Complete  Medication Review (RN Care Manager) Complete Complete  PCP or Specialist appointment within 3-5 days of  discharge Complete   HRI or Home Care Consult Complete Complete  SW Recovery Care/Counseling Consult Complete Complete  Palliative Care Screening Not Applicable Not Applicable  Skilled Nursing Facility Not Applicable Patient Refused

## 2023-01-24 NOTE — Progress Notes (Signed)
PROGRESS NOTE  [name]  DOB: [birth year]  PCP: Kerri Perches, MD QIO:[id number]  DOA: 01/12/2023  LOS: 8 days  Hospital Day: 13  Brief narrative: [name] is a 72 y.o. female with PMH significant for DM2, HTN, HLD, chronic smoking, OSA on CPAP, stroke, COPD hypothyroidism, seizure, bipolar disorder, fibromyalgia, arthritis, diverticulosis, GERD, chronic anemia 11/1, patient presented to the ED with complaint of urinary frequency, altered mental status On workup, she was found to have AKI Admitted to [facility] While hospitalized, patient also had a breakthrough seizure Details as below  Subjective: Patient was seen and examined this morning. Somnolent elderly African-American female.  Opens eyes on command.  Mumbles and fell back to sleep.  No family at bedside. In the last 24 hours, afebrile, heart rate in 60s, blood pressure in 130s, breathing on room air Labs with WBC count 8.2, hemoglobin 9.7, BUN/creatinine 30/1.46  Assessment and plan: Seizure disorder Breakthrough seizures Patient had seizure activity while admitted and while on anti epileptic drug. Neurology was consulted.  Long-term EEG did not show any evidence of epileptic activity.  Currently continued on lamotrigine 150 mg twice daily, Ativan 0.25 mg twice daily   Acute toxic metabolic encephalopathy H/o anxiety/depression Multifactorial and thought to be secondary to dehydration/AKI, seizures and polypharmacy. Appears to have improved with change in home medication regimen and treatment of AKI.  Still with episodes with delirium Currently on Zoloft 100 mg daily, Ativan 0.25 mg twice daily, Haldol as needed, melatonin nightly as needed. She was somnolent at the time of my evaluation this morning.  Per RN, she later woke up and mental status better. I will continue the same medications for today. Continue delirium precautions   AKI Baseline creatinine of about 1. Presented with creatinine of 1.31 which  improved but seems to be worsening in last 48 hours.  Repeat BMP this morning showed some improvement to 1.25. Recent Labs    11/22/22 1233 01/12/23 1706 01/13/23 0512 01/14/23 0422 01/15/23 0458 01/15/23 1150 01/17/23 1023 01/18/23 0451 01/19/23 0529 01/23/23 1124 01/24/23 1000  BUN 13 34* 26* 19 24* 26* 28* 19  --  30* 26*  CREATININE 0.85 1.31* 1.00 0.88 0.99 1.06* 1.16* 0.95 1.07* 1.46* 1.25*    Hypertension PTA meds- carvedilol 12.5 mg twice daily, HCTZ 12.5 mg daily, Aldactone 50 mg daily, telmisartan 10 mg daily Currently blood pressure is controlled with carvedilol 12.5 mg twice daily, amlodipine 5 mg daily Continue to monitor blood pressure.  IV hydralazine as needed  Type 2 diabetes mellitus A1c 5.8 on 01/13/2023 Continue Semglee 10 units daily, SSI/Accu-Cheks Recent Labs  Lab 01/23/23 1600 01/23/23 2109 01/24/23 0621 01/24/23 0837 01/24/23 1224  GLUCAP 199* 109* 134* 132* 116*    H/o stroke  hyperlipidemia PTA meds-aspirin 81 mg daily, Crestor 5 mg daily, Zetia 10 mg daily Continue the same.   Hypothyroidism Continue home Synthroid.   Chronic microcytic anemia Hemoglobin stable between 9 and 10. Continue PPI Recent Labs    01/27/22 0936 02/21/22 0954 05/12/22 1021 05/30/22 1009 08/15/22 1344 08/18/22 1435 11/14/22 0938 11/22/22 1233 01/13/23 0512 01/13/23 1057 01/15/23 0458 01/15/23 1150 01/23/23 1124 01/24/23 1000  HGB 10.6*   < > 10.7*   < > 10.5*   < > 9.7*   < > 10.4*  --  10.0* 10.0* 9.7* 9.5*  MCV 77.4*   < > 79.3*   < > 80.5   < > 80.7   < > 75.6*  --  74.7* 74.5*  73.0* 75.4*  VITAMINB12  --   --   --   --   --   --   --   --   --  682  --   --   --   --   FOLATE  --   --   --   --   --   --   --   --   --  13.3  --   --   --   --   FERRITIN 185  --  158  --  256  --  165  --  236  --   --   --   --   --   TIBC 327  --  378  --  307  --  336  --  361  --   --   --   --   --   IRON 63  --  99  --  57  --  55  --  71  --   --   --   --    --    < > = values in this interval not displayed.   COPD  Tobacco use Continue bronchodilators as needed.  Counseled to quit smoking.  Nicotine patch offered    Mobility: Seen by PT.  SNF recommended  Goals of care   Code Status: Full Code     DVT prophylaxis:  enoxaparin (LOVENOX) injection 40 mg Start: 01/13/23 1000 SCDs Start: 01/12/23 2218   Antimicrobials: None Fluid: NS at 40 mL/h Consultants: None currently Family Communication: None at bedside  Status: Inpatient Level of care:  Progressive   Patient is from: Home Needs to continue in-hospital care: Mental status is still not consistently stable Anticipated d/c to: SNF in 1 to 2 days    Diet:  Diet Order             Diet heart healthy/carb modified Room service appropriate? Yes; Fluid consistency: Thin  Diet effective now                   Scheduled Meds:  amLODipine  5 mg Oral Daily   aspirin EC  81 mg Oral Q breakfast   carvedilol  12.5 mg Oral BID WC   Chlorhexidine Gluconate Cloth  6 each Topical Daily   enoxaparin (LOVENOX) injection  40 mg Subcutaneous Q24H   ezetimibe  10 mg Oral Daily   insulin aspart  0-15 Units Subcutaneous TID WC   insulin aspart  0-5 Units Subcutaneous QHS   insulin glargine-yfgn  10 Units Subcutaneous QHS   lamoTRIgine  150 mg Oral BID   levothyroxine  25 mcg Oral Every Monday   levothyroxine  50 mcg Oral Once per day on Sunday Tuesday Wednesday Thursday Friday Saturday   LORazepam  0.25 mg Oral BID   mirabegron ER  50 mg Oral Daily   montelukast  10 mg Oral Daily   polyethylene glycol  17 g Oral BID   rosuvastatin  5 mg Oral QHS   senna-docusate  1 tablet Oral BID   sertraline  100 mg Oral Daily    PRN meds: acetaminophen, haloperidol lactate, hydrALAZINE, ipratropium-albuterol, melatonin, midazolam, midazolam PF, ondansetron (ZOFRAN) IV   Infusions:   sodium chloride 40 mL/hr at 01/24/23 0444    Antimicrobials: Anti-infectives (From admission,  onward)    None       Objective: Vitals:   01/24/23 0840 01/24/23 1144  BP: 137/60 (!) 115/56  Pulse: 70 71  Resp: 16 16  Temp: 98 F (36.7 C) 98.4 F (36.9 C)  SpO2: 100% 100%    Intake/Output Summary (Last 24 hours) at 01/24/2023 1400 Last data filed at 01/24/2023 0444 Gross per 24 hour  Intake 524 ml  Output 160 ml  Net 364 ml   Filed Weights   01/12/23 1638 01/12/23 2200  Weight: 73.5 kg 70.4 kg   Weight change:  Body mass index is 31.35 kg/m.   Physical Exam: General exam: Pleasant elderly African-American female.  Not in pain Skin: No rashes, lesions or ulcers. HEENT: Atraumatic, normocephalic, no obvious bleeding Lungs: Clear to auscultation bilaterally CVS: Regular rate and rhythm, no murmur GI/Abd: soft, nontender, nondistended, bowels are present CNS: Somnolent, open eyes on command only to mumble Psychiatry: Unable to examine because of altered mentation Extremities: No pedal edema, no calf tenderness  Data Review: I have personally reviewed the laboratory data and studies available.  F/u labs ordered Unresulted Labs (From admission, onward)     Start     Ordered   01/19/23 0500  Creatinine, serum  (enoxaparin (LOVENOX)    CrCl >/= 30 ml/min)  Weekly,   R     Comments: while on enoxaparin therapy    01/12/23 2220            Total time spent in review of labs and imaging, patient evaluation, formulation of plan, documentation and communication with family: 55 minutes  Signed, Lorin Glass, MD [facility] 01/24/2023

## 2023-01-24 NOTE — Plan of Care (Signed)
  Problem: Education: Goal: Knowledge of General Education information will improve Description: Including pain rating scale, medication(s)/side effects and non-pharmacologic comfort measures Outcome: Progressing   Problem: Health Behavior/Discharge Planning: Goal: Ability to manage health-related needs will improve Outcome: Progressing   Problem: Clinical Measurements: Goal: Ability to maintain clinical measurements within normal limits will improve Outcome: Progressing Goal: Will remain free from infection Outcome: Progressing   Problem: Activity: Goal: Risk for activity intolerance will decrease Outcome: Progressing   Problem: Nutrition: Goal: Adequate nutrition will be maintained Outcome: Progressing   Problem: Elimination: Goal: Will not experience complications related to bowel motility Outcome: Progressing Goal: Will not experience complications related to urinary retention Outcome: Progressing   Problem: Elimination: Goal: Will not experience complications related to urinary retention Outcome: Progressing   Problem: Pain Management: Goal: General experience of comfort will improve Outcome: Progressing   Problem: Safety: Goal: Ability to remain free from injury will improve Outcome: Progressing   Problem: Coping: Goal: Ability to adjust to condition or change in health will improve Outcome: Progressing   Problem: Health Behavior/Discharge Planning: Goal: Ability to identify and utilize available resources and services will improve Outcome: Progressing Goal: Ability to manage health-related needs will improve Outcome: Progressing   Problem: Metabolic: Goal: Ability to maintain appropriate glucose levels will improve Outcome: Progressing   Problem: Safety: Goal: Non-violent Restraint(s) Outcome: Progressing

## 2023-01-25 ENCOUNTER — Ambulatory Visit: Payer: Medicare HMO

## 2023-01-25 DIAGNOSIS — G9341 Metabolic encephalopathy: Secondary | ICD-10-CM | POA: Diagnosis not present

## 2023-01-25 LAB — GLUCOSE, CAPILLARY
Glucose-Capillary: 172 mg/dL — ABNORMAL HIGH (ref 70–99)
Glucose-Capillary: 204 mg/dL — ABNORMAL HIGH (ref 70–99)
Glucose-Capillary: 128 mg/dL — ABNORMAL HIGH (ref 70–99)
Glucose-Capillary: 115 mg/dL — ABNORMAL HIGH (ref 70–99)
Glucose-Capillary: 117 mg/dL — ABNORMAL HIGH (ref 70–99)
Glucose-Capillary: 97 mg/dL (ref 70–99)

## 2023-01-25 NOTE — Progress Notes (Deleted)
Name: [name] DOB: 1950-05-08 MRN: [id number]  History of Present Illness: [name] is a 72 y.o. female who presents today for follow up visit at [facility]. She is accompanied by ***. - GU history: 1. OAB with urinary frequency, nocturia, urgency, nocturnal enuresis. 2. Mixed urinary incontinence (urge predominant).  At initial visit on 12/18/2022: The plan was: 1. Increase Myrbetriq to 50 mg daily. 2. Minimize caffeine intake. 3. Work on timed voiding / bladder retraining. 4. Continue routine use of CPAP for OSA. 5. Continue to work on glycemic management. 6. Expectant management for SUI while considering treatment options.    Since last visit: 01/12/2023 - 01/17/2023: Admitted for acute metabolic encephalopathy which was multifactorial including dehydration/AKI and polypharmacy. Negative urine culture (I&O cath specimen). Discharged to ***SNF.  Today: She {Actions; denies-reports:120008} symptomatic improvement since last visit.  She reports {Blank multiple:19197::"improved","persistent / unchanged"} urinary ***frequency, ***nocturia, ***urgency, and ***urge incontinence. Voiding ***x/day and ***x/night on average. Leaking ***x/day on average; using *** ***pads / ***diapers per day on average.  She {Actions; denies-reports:120008} caffeine intake (*** caffeinated beverages per day on average).  She {Actions; denies-reports:120008} dysuria, gross hematuria, straining to void, or sensations of incomplete emptying.   Fall Screening: Do you usually have a device to assist in your mobility? {yes/no:20286} ***cane / ***walker / ***wheelchair   Medications: No current facility-administered medications for this visit.   Current Outpatient Medications  Medication Sig Dispense Refill   Accu-Chek Softclix Lancets lancets TEST BLOOD SUGAR THREE TIMES DAILY AS DIRECTED 300 each 2   amLODipine (NORVASC) 5 MG tablet Take 1 tablet (5 mg total) by mouth daily.      lamoTRIgine (LAMICTAL) 150 MG tablet Take 1 tablet (150 mg total) by mouth 2 (two) times daily.     Facility-Administered Medications Ordered in Other Visits  Medication Dose Route Frequency Provider Last Rate Last Admin   acetaminophen (TYLENOL) tablet 650 mg  650 mg Oral Q6H PRN Adefeso, Oladapo, DO   650 mg at 01/24/23 2222   amLODipine (NORVASC) tablet 5 mg  5 mg Oral Daily Tat, David, MD   5 mg at 01/25/23 4098   aspirin EC tablet 81 mg  81 mg Oral Q breakfast Adefeso, Oladapo, DO   81 mg at 01/25/23 0853   carvedilol (COREG) tablet 12.5 mg  12.5 mg Oral BID WC Adefeso, Oladapo, DO   12.5 mg at 01/25/23 1191   Chlorhexidine Gluconate Cloth 2 % PADS 6 each  6 each Topical Daily Johnson, Clanford L, MD   6 each at 01/24/23 0904   enoxaparin (LOVENOX) injection 40 mg  40 mg Subcutaneous Q24H Adefeso, Oladapo, DO   40 mg at 01/25/23 0857   ezetimibe (ZETIA) tablet 10 mg  10 mg Oral Daily Adefeso, Oladapo, DO   10 mg at 01/25/23 0853   haloperidol lactate (HALDOL) injection 1 mg  1 mg Intravenous Q6H PRN Dolly Rias, MD   1 mg at 01/21/23 1938   hydrALAZINE (APRESOLINE) injection 10 mg  10 mg Intravenous Q6H PRN Tat, Onalee Hua, MD   10 mg at 01/14/23 1809   insulin aspart (novoLOG) injection 0-15 Units  0-15 Units Subcutaneous TID WC Adefeso, Oladapo, DO   5 Units at 01/25/23 0730   insulin aspart (novoLOG) injection 0-5 Units  0-5 Units Subcutaneous QHS Adefeso, Oladapo, DO   2 Units at 01/12/23 2351   insulin glargine-yfgn (SEMGLEE) injection 10 Units  10 Units Subcutaneous QHS Adefeso, Oladapo, DO   10 Units  at 01/24/23 2216   ipratropium-albuterol (DUONEB) 0.5-2.5 (3) MG/3ML nebulizer solution 3 mL  3 mL Nebulization Q6H PRN Adefeso, Oladapo, DO       lamoTRIgine (LAMICTAL) tablet 150 mg  150 mg Oral BID Charlsie Quest, MD   150 mg at 01/25/23 4403   levothyroxine (SYNTHROID) tablet 25 mcg  25 mcg Oral Every Monday Narda Bonds, MD   25 mcg at 01/22/23 0703   levothyroxine (SYNTHROID)  tablet 50 mcg  50 mcg Oral Once per day on Sunday Tuesday Wednesday Thursday Friday Saturday Narda Bonds, MD   50 mcg at 01/25/23 4742   LORazepam (ATIVAN) tablet 0.25 mg  0.25 mg Oral BID Laural Benes, Clanford L, MD   0.25 mg at 01/25/23 0857   melatonin tablet 3 mg  3 mg Oral QHS PRN Narda Bonds, MD       midazolam (VERSED) injection 1 mg  1 mg Intravenous Q4H PRN Charlsie Quest, MD   1 mg at 01/17/23 2105   midazolam PF (VERSED) injection 1 mg  1 mg Intravenous Once PRN Laural Benes, Clanford L, MD       mirabegron ER (MYRBETRIQ) tablet 50 mg  50 mg Oral Daily Adefeso, Oladapo, DO   50 mg at 01/25/23 0852   montelukast (SINGULAIR) tablet 10 mg  10 mg Oral Daily Adefeso, Oladapo, DO   10 mg at 01/25/23 0853   ondansetron (ZOFRAN) injection 4 mg  4 mg Intravenous Q6H PRN Adefeso, Oladapo, DO   4 mg at 01/24/23 0858   polyethylene glycol (MIRALAX / GLYCOLAX) packet 17 g  17 g Oral BID Narda Bonds, MD   17 g at 01/25/23 0853   rosuvastatin (CRESTOR) tablet 5 mg  5 mg Oral QHS Adefeso, Oladapo, DO   5 mg at 01/24/23 2210   senna-docusate (Senokot-S) tablet 1 tablet  1 tablet Oral BID Standley Dakins L, MD   1 tablet at 01/25/23 0852   sertraline (ZOLOFT) tablet 100 mg  100 mg Oral Daily Tat, Onalee Hua, MD   100 mg at 01/25/23 5956    Allergies: Allergies  Allergen Reactions   Iron Itching and Nausea And Vomiting   Milk (Cow) Rash and Other (See Comments)    Doesn't agree with the stomach, also    Penicillins Hives   Phenazopyridine Hives   Cephalexin Hives   Flonase [Fluticasone] Other (See Comments)    "It gave me ulcers in my nose"   Milk-Related Compounds Nausea Only and Other (See Comments)    Doesn't agree with the stomach    Past Medical History:  Diagnosis Date   Allergy    Anemia    Anemia in chronic kidney disease (CKD) 08/10/2021   Anemia in chronic kidney disease (CKD) 10/12/2021   Anxiety    takes Ativan daily   Arthritis    Assistance needed for mobility     Bipolar disorder (HCC)    takes Risperdal nightly   Blood transfusion    Brain tumor (HCC)    Cancer (HCC)    In her gum   Carpal tunnel syndrome of right wrist 05/23/2011   Cervical disc disorder with radiculopathy of cervical region 10/31/2012   Chronic back pain    Chronic idiopathic constipation    Chronic neck and back pain    Colon polyps    COPD (chronic obstructive pulmonary disease) with chronic bronchitis (HCC) 09/16/2013   Office Spirometry 10/30/2013-submaximal effort based on appearance of loop and curve. Numbers would fit  with severe restriction but her physiologic capability may be better than this. FVC 0.91/44%, and 10.74/45%, FEV1/FVC 0.81, FEF 25-75% 1.43/69%     Diabetes mellitus    Type II   Diverticulosis    TCS 9/08 by Dr. Lina Sar for diarrhea . Bx for micro scopic colitis negative.    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   Metabolic encephalopathy 08/03/2011   Migraines    chronic headaches   Mononeuritis lower limb    Narcolepsy    Osteoporosis    Pancreatitis 2006   due to Depakote with normal EUS    Paralysis (HCC)    Pneumonia    Schatzki's ring    non critical / EGD with ED 8/2011with RMR   Seizures (HCC)    takes Lamictal daily.Last seizure 3 yrs ago   Sleep apnea    on CPAP   Small bowel obstruction (HCC)    Stroke (HCC)    left sided weakness, speech changes   Tubular adenoma of colon    Past Surgical History:  Procedure Laterality Date   ABDOMINAL  HYSTERECTOMY  1978   BACK SURGERY  July 2012   BACTERIAL OVERGROWTH TEST N/A 05/05/2013   Procedure: BACTERIAL OVERGROWTH TEST;  Surgeon: Corbin Ade, MD;  Location: [facility];  Service: Endoscopy;  Laterality: N/A;  7:30   BIOPSY THYROID  2009   BRAIN SURGERY  11/2011   resection of meningioma   BREAST REDUCTION SURGERY  1994   CARDIAC CATHETERIZATION  05/10/2005   normal coronaries, normal LV systolic function and EF (Dr. Evlyn Courier)   CARPAL TUNNEL RELEASE Left 07/22/04   Dr. Romeo Apple   CATARACT EXTRACTION Bilateral    CHOLECYSTECTOMY  1984   COLONOSCOPY N/A 09/25/2012   KGM:[id number] diverticulosis.  colonic polyp-removed : tubular adenoma   CRANIOTOMY  11/23/2011   Procedure: CRANIOTOMY TUMOR EXCISION;  Surgeon: Hewitt Shorts, MD;  Location: [facility];  Service: Neurosurgery;  Laterality: N/A;  Craniotomy for tumor resection   ESOPHAGOGASTRODUODENOSCOPY  12/29/2010   Rourk-Retained food in the esophagus and stomach, small hiatal hernia, status post Maloney dilation of the esophagus   ESOPHAGOGASTRODUODENOSCOPY N/A 09/25/2012   [id number]:[id number] atonic baggy esophagus status post Maloney dilation 56 F. Hiatal hernia   GIVENS CAPSULE STUDY N/A 01/15/2013   NORMAL.    IR GENERIC HISTORICAL  03/17/2016   IR RADIOLOGIST EVAL & MGMT 03/17/2016 [facility]   LESION REMOVAL N/A 05/31/2015   Procedure: REMOVAL RIGHT AND LEFT LESIONS OF MANDIBLE;  Surgeon: Ocie Doyne, DDS;  Location: [facility];  Service: Oral Surgery;  Laterality: N/A;   MALONEY DILATION  12/29/2010   RMR;   NM MYOCAR PERF WALL MOTION  2006   "relavtiely normal" persantine, mild anterior thinning (breast attenuation artifact), no region of scar/ischemia   OVARIAN CYST REMOVAL     RECTOCELE REPAIR N/A 06/29/2015   Procedure: POSTERIOR REPAIR (  RECTOCELE);  Surgeon: Tilda Burrow, MD;  Location: [facility];  Service: Gynecology;  Laterality: N/A;   REDUCTION MAMMAPLASTY Bilateral    REVERSE SHOULDER ARTHROPLASTY Left 08/18/2022    Procedure: REVERSE SHOULDER ARTHROPLASTY;  Surgeon: Beverely Low, MD;  Location: [facility];  Service: Orthopedics;  Laterality: Left;  choice with interscalene, general   SPINE SURGERY  09/29/2010   Dr. Shon Baton   surgical excision of 3 tumors from right thigh and right buttock  and left upper thigh  2010   TOOTH EXTRACTION Bilateral 12/14/2014   Procedure: REMOVAL OF BILATERAL MANDIBULAR EXOSTOSES;  Surgeon: Ocie Doyne, DDS;  Location: [facility] OR;  Service: Oral Surgery;  Laterality: Bilateral;   TRANSTHORACIC ECHOCARDIOGRAM  2010   EF 60-65%, mild conc LVH, grade 1 diastolic dysfunction; mildly calcified MV annulus with mildly thickened leaflets, mildly calcified MR annulus   Family History  Problem Relation Age of Onset   Heart attack Mother        HTN   Pneumonia Father    Kidney failure Father    Diabetes Father    Pancreatic cancer Sister    Cancer Sister        breast    Cancer Sister        pancreatic   Diabetes Brother    Hypertension Brother    Diabetes Brother    Alcohol abuse Maternal Uncle    Stroke Maternal Grandmother    Heart attack Maternal Grandfather    Hypertension Son    Sleep apnea Son    Colon cancer Neg Hx    Anesthesia problems Neg Hx    Hypotension Neg Hx    Malignant hyperthermia Neg Hx    Pseudochol deficiency Neg Hx    Breast cancer Neg Hx    Stomach cancer Neg Hx    Social History   Socioeconomic History   Marital status: Legally Separated    Spouse name: Not on file   Number of children: 1   Years of education: 12   Highest education level: High school graduate  Occupational History   Occupation: Disabled  Tobacco Use   Smoking status: Some Days    Current packs/day: 0.00    Average packs/day: 0.5 packs/day for 47.0 years (23.5 ttl pk-yrs)    Types: Cigarettes    Start date: 07/06/1974    Last attempt to quit: 07/05/2021    Years since quitting: 1.5    Passive exposure: Past   Smokeless tobacco: Never   Tobacco comments:    3-4 a day    Vaping Use   Vaping status: Never Used  Substance and Sexual Activity   Alcohol use: No    Alcohol/week: 0.0 standard drinks of alcohol    Comment:     Drug use: No   Sexual activity: Not Currently  Other Topics Concern   Not on file  Social History Narrative   01/29/18 Lives alone, has 3 aides, Mon- Fri 8 hrs, 2 hrs on Sat-Sun, RN manages her meds   Caffeine use: Drink coffee sometimes    Right handed    Social Determinants of Health   Financial Resource Strain: Low Risk  (07/17/2022)   Overall Financial Resource Strain (CARDIA)    Difficulty of Paying Living Expenses: Not very hard  Food Insecurity: No Food Insecurity (01/12/2023)   Hunger Vital Sign    Worried About Running Out of Food in the Last Year: Never true    Ran Out of Food in the Last Year: Never true  Transportation Needs: No Transportation Needs (01/12/2023)   PRAPARE - Administrator, Civil Service (Medical): No    Lack of Transportation (Non-Medical): No  Physical Activity: Insufficiently Active (07/17/2022)   Exercise Vital Sign    Days of Exercise per Week: 2 days    Minutes of Exercise per Session: 20 min  Stress: No Stress Concern Present (07/17/2022)   [employer] of Occupational Health - Occupational Stress Questionnaire    Feeling of Stress : Not at all  Social Connections: Moderately Integrated (07/17/2022)   Social Connection and Isolation Panel [NHANES]    Frequency of Communication with Friends and Family: Three times a week    Frequency of Social Gatherings with Friends and Family: Once a week    Attends Religious Services: More than 4 times per year    Active Member of [employer] or Organizations: Yes    Attends Engineer, structural: More than 4 times per year    Marital Status: Divorced  Intimate Partner Violence: Not At Risk (01/12/2023)   Humiliation, Afraid, Rape, and Kick questionnaire    Fear of Current or Ex-Partner: No    Emotionally Abused: No    Physically Abused: No     Sexually Abused: No    Review of Systems*** Constitutional: Patient denies any unintentional weight loss or change in strength lntegumentary: Patient denies any rashes or pruritus Eyes: Patient {Actions; denies-reports:120008} dry eyes ENT: Patient {Actions; denies-reports:120008} dry mouth Cardiovascular: Patient denies chest pain or syncope Respiratory: Patient denies shortness of breath Gastrointestinal: Patient ***denies nausea, vomiting, constipation, or diarrhea Musculoskeletal: Patient denies muscle cramps or weakness Neurologic: Patient denies convulsions or seizures Allergic/Immunologic: Patient denies recent allergic reaction(s) Hematologic/Lymphatic: Patient denies bleeding tendencies Endocrine: Patient denies heat/cold intolerance  GU: As per HPI.  OBJECTIVE There were no vitals filed for this visit. There is no height or weight on file to calculate BMI.  Physical Examination*** Constitutional: No obvious distress; patient is non-toxic appearing  Cardiovascular: No visible lower extremity edema.  Respiratory: The patient does not have audible wheezing/stridor; respirations do not appear labored  Gastrointestinal: Abdomen non-distended Musculoskeletal: Normal ROM of UEs  Skin: No obvious rashes/open sores  Neurologic: CN 2-12 grossly intact Psychiatric: Answered questions appropriately with normal affect  Hematologic/Lymphatic/Immunologic: No obvious bruises or sites of spontaneous bleeding  UA: ***negative *** WBC/hpf, *** RBC/hpf, bacteria (***) PVR: *** ml  ASSESSMENT No diagnosis found. *** We discussed the symptoms of overactive bladder (OAB), which include urinary urgency, frequency, nocturia, with or without urge incontinence.   While we may not know the exact etiology of OAB, several risk factors can be identified.  - We discussed this patient's neurogenic risk factors for OAB-type symptoms including ***T2DM, ***nicotine use, ***.  - Likely  exacerbated by ***diuretic use, ***caffeine intake, ***consumption of bladder irritants such as (acidic foods, spicy foods, alcohol).   We discussed the following management options in detail including potential benefits, risks, and side effects: Behavioral therapy: Modify fluid intake Decreasing bladder irritants (such as caffeine, acidic foods, spicy foods, alcohol) Urge suppression strategies Bladder retraining / timed voiding Double voiding Medication(s): - For anticholinergic medications, we discussed the potential side effects of anticholinergics including dry eyes, dry mouth, constipation, cognitive impairment and urinary retention.  - For beta-3 agonist medication, we discussed the risk for urinary retention and the potential side effect of elevated blood pressure specific to Myrbetriq (which is more likely to occur in individuals with uncontrolled hypertension).  For refractory cases: PTNS (posterior tibial nerve  stimulation) Sacral neuromodulation trial (Medtronic lnterStim or Axonics implant) Bladder Botox injections In extreme cases, SP tube placement  ***In order to further evaluate urinary incontinence, She was instructed to complete a 3-day bladder diary.  ***Discussed recommendation for urodynamic testing, which was described in detail including risks such as bleeding, infection, organ/tissue/nerve damage.  She decided to proceed with *** ***work on behavioral modifications including ***minimizing caffeine intake and working on ***timed voiding.  Will plan for follow up in ***8 weeks / *** months / ***1 year or sooner if needed. Pt verbalized understanding and agreement. All questions were answered.  PLAN Advised the following: *** ***. Minimize caffeine intake. ***. Work on timed voiding. ***. Urge suppression strategies. ***. Double/ triple voiding. ***. No follow-ups on file.  No orders of the defined types were placed in this encounter.   It has been  explained that the patient is to follow regularly with their PCP in addition to all other providers involved in their care and to follow instructions provided by these respective offices. Patient advised to contact urology clinic if any urologic-pertaining questions, concerns, new symptoms or problems arise in the interim period.  There are no Patient Instructions on file for this visit.  Electronically signed by:  Donnita Falls, FNP   01/25/23    2:49 PM

## 2023-01-25 NOTE — Progress Notes (Signed)
PROGRESS NOTE  [name]  DOB: [birth year]  PCP: Kerri Perches, MD HQI:[id number]  DOA: 01/12/2023  LOS: 9 days  Hospital Day: 14  Brief narrative: [name] is a 72 y.o. female with PMH significant for DM2, HTN, HLD, chronic smoking, OSA on CPAP, stroke, COPD hypothyroidism, seizure, bipolar disorder, fibromyalgia, arthritis, diverticulosis, GERD, chronic anemia 11/1, patient presented to the ED with complaint of urinary frequency, altered mental status On workup, she was found to have AKI Admitted to [facility] While hospitalized, patient also had a breakthrough seizure Details as below  Subjective: Patient was seen and examined this morning. Propped up in bed.  Awake, alert, oriented to self.  Able to answer questions.  Niece/POA at bedside. Per caseworker, facility cannot take her today but can take tomorrow.  Assessment and plan: Seizure disorder Breakthrough seizures Patient had seizure activity while admitted and while on anti epileptic drug. Neurology was consulted.  Long-term EEG did not show any evidence of epileptic activity.  Currently continued on lamotrigine 150 mg twice daily, Ativan 0.25 mg twice daily   Acute toxic metabolic encephalopathy H/o anxiety/depression Multifactorial and thought to be secondary to dehydration/AKI, seizures and polypharmacy. Appears to have improved with change in home medication regimen and treatment of AKI.  Currently on Zoloft 100 mg daily, Ativan 0.25 mg twice daily, Haldol as needed, melatonin nightly as needed. Mental status seems to be improving.  Wide-awake this morning.  Currently at baseline mental status per her niece/POA at bedside.  Continue same meds. Continue delirium precautions   AKI Baseline creatinine of about 1. Presented with creatinine of 1.31.  Trend as below.  1.25 on last check yesterday.  Continue to monitor Recent Labs    11/22/22 1233 01/12/23 1706 01/13/23 0512 01/14/23 0422 01/15/23 0458  01/15/23 1150 01/17/23 1023 01/18/23 0451 01/19/23 0529 01/23/23 1124 01/24/23 1000  BUN 13 34* 26* 19 24* 26* 28* 19  --  30* 26*  CREATININE 0.85 1.31* 1.00 0.88 0.99 1.06* 1.16* 0.95 1.07* 1.46* 1.25*    Hypertension PTA meds- carvedilol 12.5 mg twice daily, HCTZ 12.5 mg daily, Aldactone 50 mg daily, telmisartan 10 mg daily Currently blood pressure is controlled with carvedilol 12.5 mg twice daily, amlodipine 5 mg daily Continue to monitor blood pressure.  IV hydralazine as needed  Type 2 diabetes mellitus A1c 5.8 on 01/13/2023 Continue Semglee 10 units daily, SSI/Accu-Cheks Recent Labs  Lab 01/24/23 0837 01/24/23 1224 01/24/23 1629 01/24/23 2044 01/25/23 0631  GLUCAP 132* 116* 173* 172* 204*    H/o stroke  hyperlipidemia PTA meds-aspirin 81 mg daily, Crestor 5 mg daily, Zetia 10 mg daily Continue the same.   Hypothyroidism Continue home Synthroid.   Chronic microcytic anemia Hemoglobin stable between 9 and 10. Continue PPI Recent Labs    01/27/22 0936 02/21/22 0954 05/12/22 1021 05/30/22 1009 08/15/22 1344 08/18/22 1435 11/14/22 0938 11/22/22 1233 01/13/23 0512 01/13/23 1057 01/15/23 0458 01/15/23 1150 01/23/23 1124 01/24/23 1000  HGB 10.6*   < > 10.7*   < > 10.5*   < > 9.7*   < > 10.4*  --  10.0* 10.0* 9.7* 9.5*  MCV 77.4*   < > 79.3*   < > 80.5   < > 80.7   < > 75.6*  --  74.7* 74.5* 73.0* 75.4*  VITAMINB12  --   --   --   --   --   --   --   --   --  682  --   --   --   --  FOLATE  --   --   --   --   --   --   --   --   --  13.3  --   --   --   --   FERRITIN 185  --  158  --  256  --  165  --  236  --   --   --   --   --   TIBC 327  --  378  --  307  --  336  --  361  --   --   --   --   --   IRON 63  --  99  --  57  --  55  --  71  --   --   --   --   --    < > = values in this interval not displayed.   COPD  Tobacco use Continue bronchodilators as needed.  Counseled to quit smoking.  Nicotine patch offered    Mobility: Seen by PT.  SNF  recommended  Goals of care   Code Status: Full Code     DVT prophylaxis:  enoxaparin (LOVENOX) injection 40 mg Start: 01/13/23 1000 SCDs Start: 01/12/23 2218   Antimicrobials: None Fluid: IV fluid can stop. Consultants: None currently Family Communication: None at bedside  Status: Inpatient Level of care:  Progressive   Patient is from: Home Needs to continue in-hospital care: Medically stable for discharge today.  Pending SNF bed availability.   Diet:  Diet Order             Diet heart healthy/carb modified Room service appropriate? Yes; Fluid consistency: Thin  Diet effective now                   Scheduled Meds:  amLODipine  5 mg Oral Daily   aspirin EC  81 mg Oral Q breakfast   carvedilol  12.5 mg Oral BID WC   Chlorhexidine Gluconate Cloth  6 each Topical Daily   enoxaparin (LOVENOX) injection  40 mg Subcutaneous Q24H   ezetimibe  10 mg Oral Daily   insulin aspart  0-15 Units Subcutaneous TID WC   insulin aspart  0-5 Units Subcutaneous QHS   insulin glargine-yfgn  10 Units Subcutaneous QHS   lamoTRIgine  150 mg Oral BID   levothyroxine  25 mcg Oral Every Monday   levothyroxine  50 mcg Oral Once per day on Sunday Tuesday Wednesday Thursday Friday Saturday   LORazepam  0.25 mg Oral BID   mirabegron ER  50 mg Oral Daily   montelukast  10 mg Oral Daily   polyethylene glycol  17 g Oral BID   rosuvastatin  5 mg Oral QHS   senna-docusate  1 tablet Oral BID   sertraline  100 mg Oral Daily    PRN meds: acetaminophen, haloperidol lactate, hydrALAZINE, ipratropium-albuterol, melatonin, midazolam, midazolam PF, ondansetron (ZOFRAN) IV   Infusions:     Antimicrobials: Anti-infectives (From admission, onward)    None       Objective: Vitals:   01/25/23 0316 01/25/23 0736  BP: 120/64 (!) 135/56  Pulse: 65 81  Resp: 18 18  Temp: 98.6 F (37 C) 98.3 F (36.8 C)  SpO2: 100% 100%    Intake/Output Summary (Last 24 hours) at 01/25/2023 1156 Last  data filed at 01/24/2023 1443 Gross per 24 hour  Intake --  Output 500 ml  Net -500 ml   Filed Weights   01/12/23  1638 01/12/23 2200  Weight: 73.5 kg 70.4 kg   Weight change:  Body mass index is 31.35 kg/m.   Physical Exam: General exam: Pleasant elderly African-American female.  Not in pain Skin: No rashes, lesions or ulcers. HEENT: Atraumatic, normocephalic, no obvious bleeding Lungs: Clear to auscultation bilaterally CVS: Regular rate and rhythm, no murmur GI/Abd: soft, nontender, nondistended, bowels are present CNS: Alert, awake, oriented to self and place.  At baseline mental status per POA at bedside Psychiatry: Mood appropriate. Extremities: No pedal edema, no calf tenderness  Data Review: I have personally reviewed the laboratory data and studies available.  F/u labs ordered Unresulted Labs (From admission, onward)     Start     Ordered   01/19/23 0500  Creatinine, serum  (enoxaparin (LOVENOX)    CrCl >/= 30 ml/min)  Weekly,   R     Comments: while on enoxaparin therapy    01/12/23 2220            Total time spent in review of labs and imaging, patient evaluation, formulation of plan, documentation and communication with family: 45 minutes  Signed, Lorin Glass, MD [facility] 01/25/2023

## 2023-01-25 NOTE — Plan of Care (Signed)
  Problem: Education: Goal: Knowledge of General Education information will improve Description: Including pain rating scale, medication(s)/side effects and non-pharmacologic comfort measures Outcome: Progressing   Problem: Clinical Measurements: Goal: Ability to maintain clinical measurements within normal limits will improve Outcome: Progressing Goal: Will remain free from infection Outcome: Progressing   Problem: Activity: Goal: Risk for activity intolerance will decrease Outcome: Progressing   Problem: Nutrition: Goal: Adequate nutrition will be maintained Outcome: Progressing   Problem: Elimination: Goal: Will not experience complications related to bowel motility Outcome: Progressing Goal: Will not experience complications related to urinary retention Outcome: Progressing   Problem: Pain Management: Goal: General experience of comfort will improve Outcome: Progressing   Problem: Safety: Goal: Ability to remain free from injury will improve Outcome: Progressing   Problem: Coping: Goal: Ability to adjust to condition or change in health will improve Outcome: Progressing   Problem: Health Behavior/Discharge Planning: Goal: Ability to identify and utilize available resources and services will improve Outcome: Progressing Goal: Ability to manage health-related needs will improve Outcome: Progressing   Problem: Metabolic: Goal: Ability to maintain appropriate glucose levels will improve Outcome: Progressing   Problem: Safety: Goal: Non-violent Restraint(s) Outcome: Progressing

## 2023-01-26 DIAGNOSIS — G9341 Metabolic encephalopathy: Secondary | ICD-10-CM | POA: Diagnosis not present

## 2023-01-26 LAB — CREATININE, SERUM
Creatinine, Ser: 1.09 mg/dL — ABNORMAL HIGH (ref 0.44–1.00)
GFR, Estimated: 54 mL/min — ABNORMAL LOW (ref >60)

## 2023-01-26 LAB — GLUCOSE, CAPILLARY
Glucose-Capillary: 121 mg/dL — ABNORMAL HIGH (ref 70–99)
Glucose-Capillary: 121 mg/dL — ABNORMAL HIGH (ref 70–99)
Glucose-Capillary: 157 mg/dL — ABNORMAL HIGH (ref 70–99)
Glucose-Capillary: 190 mg/dL — ABNORMAL HIGH (ref 70–99)
Glucose-Capillary: 149 mg/dL — ABNORMAL HIGH (ref 70–99)
Glucose-Capillary: 131 mg/dL — ABNORMAL HIGH (ref 70–99)

## 2023-01-26 MED ORDER — LORAZEPAM 0.5 MG PO TABS: 0.2500 mg | ORAL_TABLET | Freq: Two times a day (BID) | ORAL | 0 refills | Status: AC

## 2023-01-26 MED ORDER — LAMOTRIGINE 150 MG PO TABS: 150.0000 mg | ORAL_TABLET | Freq: Two times a day (BID) | ORAL | 0 refills | Status: AC

## 2023-01-26 MED ORDER — INSULIN ASPART 100 UNIT/ML IJ SOLN: 0.0000 [IU] | Freq: Three times a day (TID) | INTRAMUSCULAR | Status: DC

## 2023-01-26 MED ORDER — MELATONIN 3 MG PO TABS: 3.0000 mg | ORAL_TABLET | Freq: Every evening | ORAL | Status: DC | PRN

## 2023-01-26 MED ORDER — INSULIN ASPART 100 UNIT/ML IJ SOLN: 0.0000 [IU] | Freq: Every day | INTRAMUSCULAR | Status: DC

## 2023-01-26 MED ORDER — SENNOSIDES-DOCUSATE SODIUM 8.6-50 MG PO TABS: 1.0000 | ORAL_TABLET | Freq: Two times a day (BID) | ORAL | Status: DC

## 2023-01-26 MED ORDER — POLYETHYLENE GLYCOL 3350 17 G PO PACK: 17.0000 g | PACK | Freq: Every day | ORAL | Status: DC | PRN

## 2023-01-26 MED ORDER — SERTRALINE HCL 100 MG PO TABS: 100.0000 mg | ORAL_TABLET | Freq: Every morning | ORAL | 0 refills | Status: AC

## 2023-01-26 MED ORDER — PREGABALIN 75 MG PO CAPS: 75.0000 mg | ORAL_CAPSULE | Freq: Every day | ORAL | 0 refills | Status: DC

## 2023-01-26 MED ORDER — INSULIN GLARGINE-YFGN 100 UNIT/ML ~~LOC~~ SOLN: 10.0000 [IU] | Freq: Every day | SUBCUTANEOUS | Status: DC

## 2023-01-26 NOTE — Progress Notes (Signed)
Physical Therapy Treatment Patient Details Name: [name] MRN: [id number] DOB: 1950/04/25 Today's Date: 01/26/2023   History of Present Illness Pt is a 72 yo female admitted 11/1 to [facility] for AMS, urinary frequency, AKI, encephalopathy, and breakthrough seizure, transferred to [facility] 11/7. PMHx: HTN, HLD, GERD, COPD, hypothyroidism, seizures, CVA, T2DM, glaucoma, PVD, PAF, PPM    PT Comments  Pt greeted resting in bed and agreeable to session with continued c/o dizziness throughout session and limited by fatigue. Pt reporting symptoms consistent in all positions (supine, sitting, standing) and no change in symptoms during positional changes. Pt requiring CGA for bed mobility and min A to transfer to stand with RW support. Pt able to complete pre-gait activities in standing, weight shifting and marching with tactile cues to flex hip/knee. Pt able to take a few sides steps toward [facility] with min A to steady and cues for LE sequencing. Current plan remains appropriate to address deficits and maximize functional independence and decrease caregiver burden. Pt continues to benefit from skilled PT services to progress toward functional mobility goals.     If plan is discharge home, recommend the following: A lot of help with walking and/or transfers;A lot of help with bathing/dressing/bathroom;Assistance with cooking/housework;Direct supervision/assist for medications management;Direct supervision/assist for financial management;Assist for transportation   Can travel by private vehicle        Equipment Recommendations  None recommended by PT    Recommendations for Other Services       Precautions / Restrictions Precautions Precautions: Fall Restrictions Weight Bearing Restrictions: No     Mobility  Bed Mobility Overal bed mobility: Needs Assistance Bed Mobility: Supine to Sit, Sit to Supine     Supine to sit: Min assist Sit to supine: Contact guard assist   General bed mobility  comments: light min A to elevate trunk    Transfers Overall transfer level: Needs assistance Equipment used: Rolling walker (2 wheels) Transfers: Sit to/from Stand Sit to Stand: Min assist           General transfer comment: light min A to come to stand x3 during session, pt able to take a few side steps toward [facility] with max tactile cues for each step    Ambulation/Gait                   Stairs             Wheelchair Mobility     Tilt Bed    Modified Rankin (Stroke Patients Only)       Balance Overall balance assessment: Needs assistance Sitting-balance support: No upper extremity supported Sitting balance-Leahy Scale: Fair     Standing balance support: During functional activity Standing balance-Leahy Scale: Poor Standing balance comment: reliant on UE support in standing                            Cognition Arousal: Alert Behavior During Therapy: Flat affect Overall Cognitive Status: Impaired/Different from baseline Area of Impairment: Attention, Following commands, Awareness, Safety/judgement, Problem solving                   Current Attention Level: Sustained   Following Commands: Follows one step commands consistently Safety/Judgement: Decreased awareness of deficits Awareness: Emergent Problem Solving: Requires verbal cues General Comments: Limited insight to deficits and safety implications.        Exercises      General Comments General comments (skin integrity, edema, etc.):  VSS      Pertinent Vitals/Pain Pain Assessment Pain Assessment: No/denies pain    Home Living                          Prior Function            PT Goals (current goals can now be found in the care plan section) Acute Rehab PT Goals Patient Stated Goal: Go home PT Goal Formulation: With patient Time For Goal Achievement: 01/27/23 Progress towards PT goals: Progressing toward goals    Frequency    Min  1X/week      PT Plan      Co-evaluation              AM-PAC PT "6 Clicks" Mobility   Outcome Measure  Help needed turning from your back to your side while in a flat bed without using bedrails?: A Little Help needed moving from lying on your back to sitting on the side of a flat bed without using bedrails?: A Little Help needed moving to and from a bed to a chair (including a wheelchair)?: A Lot Help needed standing up from a chair using your arms (e.g., wheelchair or bedside chair)?: A Little Help needed to walk in hospital room?: Total Help needed climbing 3-5 steps with a railing? : Total 6 Click Score: 13    End of Session   Activity Tolerance: Patient tolerated treatment well;Treatment limited secondary to medical complications (Comment) (limited by dizziness) Patient left: in bed;with call bell/phone within reach;with bed alarm set Nurse Communication: Mobility status PT Visit Diagnosis: Unsteadiness on feet (R26.81);Muscle weakness (generalized) (M62.81)     Time: 9562-1308 PT Time Calculation (min) (ACUTE ONLY): 14 min  Charges:    $Therapeutic Activity: 8-22 mins PT General Charges $$ ACUTE PT VISIT: 1 Visit                     Ketrick Matney R. PTA Acute Rehabilitation Services Office: [phone]   Catalina Antigua 01/26/2023, 4:25 PM

## 2023-01-26 NOTE — TOC Transition Note (Signed)
Transition of Care [facility]) - CM/SW Discharge Note   Patient Details  Name: [name] MRN: [id number] Date of Birth: Jul 29, 1950  Transition of [facility] CM/SW Contact:  Baldemar Lenis, [place] Phone Number: 01/26/2023, 2:03 PM   Clinical Narrative:   [facility] has a bed to admit the patient today. CSW sent discharge information, confirmed bed is ready. CSW updated niece, Merdis Delay, via phone and she is in agreement. Transport arranged with [facility] for next available.  Nurse to call report to [phone], Room A26-2.    Final next level of care: Skilled Nursing Facility Barriers to Discharge: Barriers Resolved   Patient Goals and CMS Choice CMS Medicare.gov Compare Post Acute Care list provided to:: Patient Represenative (must comment) (POA) Choice offered to / list presented to : [facility] POA / Guardian  Discharge Placement                Patient chooses bed at:  [facility]) Patient to be transferred to facility by: [facility] Name of family member notified: Merdis Delay Patient and family notified of of transfer: 01/26/23  Discharge Plan and Services Additional resources added to the After Visit Summary for       Post Acute Care Choice: Home Health                    HH Arranged: PT, OT, Social Work, Refused SNF HH Agency: [facility] Date [facility] Agency Contacted: 01/13/23 Time HH Agency Contacted: 1240 Representative spoke with at [facility] Agency: Cindie  Social Determinants of Health (SDOH) Interventions SDOH Screenings   Food Insecurity: No Food Insecurity (01/12/2023)  Housing: Patient Declined (01/12/2023)  Transportation Needs: No Transportation Needs (01/12/2023)  Utilities: Not At Risk (01/12/2023)  Alcohol Screen: Low Risk  (07/17/2022)  Depression (PHQ2-9): Low Risk  (12/12/2022)  Financial Resource Strain: Low Risk  (07/17/2022)  Physical Activity: Insufficiently Active (07/17/2022)  Social Connections: Moderately Integrated (07/17/2022)  Stress: No Stress  Concern Present (07/17/2022)  Tobacco Use: High Risk (01/13/2023)  Health Literacy: Adequate Health Literacy (12/14/2022)     Readmission Risk Interventions    08/21/2022   11:54 AM 09/16/2021   10:07 AM  Readmission Risk Prevention Plan  Transportation Screening Complete Complete  Medication Review (RN Care Manager) Complete Complete  PCP or Specialist appointment within 3-5 days of discharge Complete   HRI or Home Care Consult Complete Complete  SW Recovery Care/Counseling Consult Complete Complete  Palliative Care Screening Not Applicable Not Applicable  Skilled Nursing Facility Not Applicable Patient Refused

## 2023-01-26 NOTE — Discharge Summary (Signed)
Physician Discharge Summary  Jazyiah Kresl Wayment MVH:[id number] DOB: 1950/07/07 DOA: 01/12/2023  PCP: Kerri Perches, MD  Admit date: 01/12/2023 Discharge date: 01/27/2023  Admitted From: Home Discharge disposition: SNF  Recommendations at discharge:  Your seizure and psychiatry medicines have been adjusted. Blood pressure and diabetes medicines have been adjusted as well.   Brief narrative: [name] is a 72 y.o. female with PMH significant for DM2, HTN, HLD, chronic smoking, OSA on CPAP, stroke, COPD hypothyroidism, seizure, bipolar disorder, fibromyalgia, arthritis, diverticulosis, GERD, chronic anemia 11/1, patient presented to the ED with complaint of urinary frequency, altered mental status On workup, she was found to have AKI Admitted to [facility] While hospitalized, patient also had a breakthrough seizure Details as below  Subjective: Patient was seen and examined this morning.  Propped up in bed.  Not in distress.  Alert, awake, oriented to place and person.  Family not at bedside. Ready for discharge today.  Hospital course: Seizure disorder Breakthrough seizures Patient had seizure activity while admitted and while on anti epileptic drug. Neurology was consulted.  Long-term EEG did not show any evidence of epileptic activity.  Currently continued on lamotrigine 150 mg twice daily, Ativan 0.25 mg twice daily   Acute toxic metabolic encephalopathy H/o anxiety/depression Multifactorial and thought to be secondary to dehydration/AKI, seizures and polypharmacy. Appears to have improved with change in home medication regimen and treatment of AKI.  Currently on Zoloft 100 mg daily, Ativan 0.25 mg twice daily, melatonin nightly as needed. Mental status much better.  Oriented to self and place.  Currently at baseline mental status per her niece/POA at bedside.  Continue same meds. Continue delirium precautions   AKI Baseline creatinine of about 1. Presented with creatinine of  1.31.  Gradually improving.  Creatinine 1.09 today.   Recent Labs    11/22/22 1233 01/12/23 1706 01/13/23 0512 01/14/23 0422 01/15/23 0458 01/15/23 1150 01/17/23 1023 01/18/23 0451 01/19/23 0529 01/23/23 1124 01/24/23 1000 01/26/23 0449  BUN 13 34* 26* 19 24* 26* 28* 19  --  30* 26*  --   CREATININE 0.85 1.31* 1.00 0.88 0.99 1.06* 1.16* 0.95 1.07* 1.46* 1.25* 1.09*    Hypertension PTA meds- carvedilol 12.5 mg twice daily, HCTZ 12.5 mg daily, Aldactone 50 mg daily, telmisartan 10 mg daily Currently blood pressure is controlled with carvedilol 12.5 mg twice daily, amlodipine 5 mg daily.  Continue these 2 medicines only at discharge.  Stop HCTZ, Aldactone and telmisartan for now.  Type 2 diabetes mellitus Diabetic neuropathy A1c 5.8 on 01/13/2023 Continue Semglee 10 units daily, SSI/Accu-Cheks Continue Lyrica for neuropathy Recent Labs  Lab 01/26/23 1215 01/26/23 1548 01/26/23 1938 01/26/23 2142 01/27/23 0606  GLUCAP 157* 190* 149* 131* 120*    H/o stroke  hyperlipidemia PTA meds-aspirin 81 mg daily, Crestor 5 mg daily, Zetia 10 mg daily Continue the same.   Hypothyroidism Continue home dose of Synthroid.   Chronic microcytic anemia Hemoglobin stable between 9 and 10. Continue PPI Recent Labs    05/12/22 1021 05/30/22 1009 08/15/22 1344 08/18/22 1435 11/14/22 0938 11/22/22 1233 01/13/23 0512 01/13/23 1057 01/15/23 0458 01/15/23 1150 01/23/23 1124 01/24/23 1000  HGB 10.7*   < > 10.5*   < > 9.7*   < > 10.4*  --  10.0* 10.0* 9.7* 9.5*  MCV 79.3*   < > 80.5   < > 80.7   < > 75.6*  --  74.7* 74.5* 73.0* 75.4*  VITAMINB12  --   --   --   --   --   --   --  682  --   --   --   --   FOLATE  --   --   --   --   --   --   --  13.3  --   --   --   --   FERRITIN 158  --  256  --  165  --  236  --   --   --   --   --   TIBC 378  --  307  --  336  --  361  --   --   --   --   --   IRON 99  --  57  --  55  --  71  --   --   --   --   --    < > = values in this  interval not displayed.   COPD  Tobacco use Continue bronchodilators as needed.  Counseled to quit smoking.  Nicotine patch offered    Impaired mobility  Seen by PT.  SNF recommended  Goals of care   Code Status: Full Code   Wounds:  -    Discharge Exam:   Vitals:   01/26/23 1937 01/26/23 2350 01/27/23 0402 01/27/23 0833  BP: 129/76 (!) 148/67 (!) 145/65 (!) 166/76  Pulse: 76 72 76 79  Resp: 18 18 18 17   Temp: 99.1 F (37.3 C) 98 F (36.7 C) 99.2 F (37.3 C) 98.7 F (37.1 C)  TempSrc: Oral Oral Oral Oral  SpO2: 98% 99% 97% 100%  Weight:      Height:        Body mass index is 31.35 kg/m.   General exam: Pleasant elderly African-American female.  Not in pain Skin: No rashes, lesions or ulcers. HEENT: Atraumatic, normocephalic, no obvious bleeding Lungs: Clear to auscultation bilaterally CVS: Regular rate and rhythm, no murmur GI/Abd: soft, nontender, nondistended, bowels are present CNS: Alert, awake, oriented to self and place.  At baseline mental status per POA at bedside Psychiatry: Mood appropriate. Extremities: No pedal edema, no calf tenderness  Follow ups:    Contact information for follow-up providers     Care, [facility] Follow up.   Specialty: Home Health Services Why: You were referred to [facility] for Physical Therapy, Occupational Therapy, and Social Work services. A representative will reach out to you within 24-48 hours after discharge. Contact information: [address] [place] [address] [phone]         Kerri Perches, MD Follow up.   Specialty: Family Medicine Contact information: [address] [place] [address] [phone]              Contact information for after-discharge care     Destination     [facility] .   Service: Skilled Nursing Contact information: [address] [place]  [address] [phone]                     Discharge Instructions:   Discharge Instructions     Call MD for:  difficulty breathing, headache or visual disturbances   Complete by: As directed    Call MD for:  extreme fatigue   Complete by: As directed    Call MD for:  hives   Complete by: As directed    Call MD for:  persistant dizziness or light-headedness   Complete by: As directed  Call MD for:  persistant nausea and vomiting   Complete by: As directed    Call MD for:  severe uncontrolled pain   Complete by: As directed    Call MD for:  temperature >100.4   Complete by: As directed    Diet - low sodium heart healthy   Complete by: As directed    Diet Carb Modified   Complete by: As directed    Discharge instructions   Complete by: As directed    Recommendations at discharge:   Your seizure and psychiatry medicines have been adjusted.  Blood pressure and diabetes medicines have been adjusted as well.  Discharge instructions for diabetes mellitus: Check blood sugar 3 times a day and bedtime at home. If blood sugar running above 200 or less than 70 please call your MD to adjust insulin. If you notice signs and symptoms of hypoglycemia (low blood sugar) like jitteriness, confusion, thirst, tremor and sweating, please check blood sugar, drink sugary drink/biscuits/sweets to increase sugar level and call MD or return to ER.    Seizure precautions: -Per [facility] statutes, patients with seizures are not allowed to drive until they have been seizure-free for six months.  -Use caution when using heavy equipment or power tools.  -Avoid working on ladders or at heights.  -Take showers instead of baths. Ensure the water temperature is not too high on the home water heater.  -Do not go swimming alone.  -Do not lock yourself in a room alone (i.e. bathroom). -When caring for infants or small children, sit down when holding, feeding, or changing them to minimize  risk of injury to the child in the event you have a seizure.  -Maintain good sleep hygiene. -Avoid alcohol.    If patient has another seizure, call 911 and bring them back to the ED if: A.  The seizure lasts longer than 5 minutes.      B.  The patient doesn't wake shortly after the seizure or has new problems such as difficulty seeing, speaking or moving following the seizure C.  The patient was injured during the seizure D.  The patient has a temperature over 102 F (39C) E.  The patient vomited during the seizure and now is having trouble breathing    General discharge instructions: Follow with Primary MD Kerri Perches, MD in 7 days  Please request your PCP  to go over your hospital tests, procedures, radiology results at the follow up. Please get your medicines reviewed and adjusted.  Your PCP may decide to repeat certain labs or tests as needed. Do not drive, operate heavy machinery, perform activities at heights, swimming or participation in water activities or provide baby sitting services if your were admitted for syncope or siezures until you have seen by Primary MD or a Neurologist and advised to do so again. [place] Controlled Substance Reporting System database was reviewed. Do not drive, operate heavy machinery, perform activities at heights, swim, participate in water activities or provide baby-sitting services while on medications for pain, sleep and mood until your outpatient physician has reevaluated you and advised to do so again.  You are strongly recommended to comply with the dose, frequency and duration of prescribed medications. Activity: As tolerated with Full fall precautions use walker/cane & assistance as needed Avoid using any recreational substances like cigarette, tobacco, alcohol, or non-prescribed drug. If you experience worsening of your admission symptoms, develop shortness of breath, life threatening emergency, suicidal or homicidal thoughts you must  seek medical attention immediately by calling 911 or calling your MD immediately  if symptoms less severe. You must read complete instructions/literature along with all the possible adverse reactions/side effects for all the medicines you take and that have been prescribed to you. Take any new medicine only after you have completely understood and accepted all the possible adverse reactions/side effects.  Wear Seat belts while driving. You were cared for by a hospitalist during your hospital stay. If you have any questions about your discharge medications or the care you received while you were in the hospital after you are discharged, you can call the unit and ask to speak with the hospitalist or the covering physician. Once you are discharged, your primary care physician will handle any further medical issues. Please note that NO REFILLS for any discharge medications will be authorized once you are discharged, as it is imperative that you return to your primary care physician (or establish a relationship with a primary care physician if you do not have one).   Increase activity slowly   Complete by: As directed        Discharge Medications:   Allergies as of 01/27/2023       Reactions   Iron Itching, Nausea And Vomiting   Milk (cow) Rash, Other (See Comments)   Doesn't agree with the stomach, also   Penicillins Hives   Phenazopyridine Hives   Cephalexin Hives   Flonase [fluticasone] Other (See Comments)   "It gave me ulcers in my nose"   Milk-related Compounds Nausea Only, Other (See Comments)   Doesn't agree with the stomach        Medication List     STOP taking these medications    buPROPion 150 MG 24 hr tablet Commonly known as: Wellbutrin XL   hydrochlorothiazide 12.5 MG capsule Commonly known as: MICROZIDE   HYDROcodone-acetaminophen 5-325 MG tablet Commonly known as: NORCO/VICODIN   metoprolol tartrate 50 MG tablet Commonly known as: LOPRESSOR   nicotine 21  mg/24hr patch Commonly known as: Nicoderm CQ   NovoLOG FlexPen 100 UNIT/ML FlexPen Generic drug: insulin aspart Replaced by: insulin aspart 100 UNIT/ML injection   potassium chloride 10 MEQ tablet Commonly known as: KLOR-CON   risperiDONE 0.5 MG tablet Commonly known as: RISPERDAL   spironolactone 50 MG tablet Commonly known as: ALDACTONE   telmisartan 20 MG tablet Commonly known as: MICARDIS   Toujeo Max SoloStar 300 UNIT/ML Solostar Pen Generic drug: insulin glargine (2 Unit Dial)   traZODone 150 MG tablet Commonly known as: DESYREL       TAKE these medications    Accu-Chek Guide Control Liqd USE AS DIRECTED   Accu-Chek Guide test strip Generic drug: glucose blood Use as instructed   Accu-Chek Softclix Lancets lancets TEST BLOOD SUGAR THREE TIMES DAILY AS DIRECTED   acetaminophen 325 MG tablet Commonly known as: TYLENOL Take 650 mg by mouth every 6 (six) hours as needed for mild pain, moderate pain or headache.   Adjustable Arm Sling Misc L arm sling   alendronate 70 MG tablet Commonly known as: FOSAMAX TAKE 1 TABLET EVERY 7 DAYS ON AN EMPTY STOMACH WITH A FULL GLASS OF WATER What changed: See the new instructions.   amLODipine 5 MG tablet Commonly known as: NORVASC Take 1 tablet (5 mg total) by mouth daily. What changed:  medication strength See the new instructions.   aspirin EC 81 MG tablet Take 1 tablet (81 mg total) by mouth daily with breakfast.   blood glucose meter  kit and supplies Dispense based on patient and insurance preference. Use up to four times daily as directed. (FOR ICD-10 E10.9, E11.9).   carvedilol 12.5 MG tablet Commonly known as: COREG Take 12.5 mg by mouth 2 (two) times daily with a meal.   D3 PO Take 1 tablet by mouth daily.   Droplet Pen Needles 31G X 8 MM Misc Generic drug: Insulin Pen Needle USE FOR INJECTING INSULIN 4 TIMES DAILY.   DropSafe Alcohol Prep 70 % Pads USE TO CLEAN FINGER PRIOR TO TESTING FOR BLOOD  SUGAR AS DIRECTED   DSS 100 MG Caps Take 100 mg by mouth in the morning and at bedtime.   ezetimibe 10 MG tablet Commonly known as: ZETIA TAKE 1 TABLET EVERY DAY   FreeStyle Libre 14 Day Sensor Misc 1 each by Does not apply route every 14 (fourteen) days. Change every 2 weeks   insulin aspart 100 UNIT/ML injection Commonly known as: novoLOG Inject 0-5 Units into the skin at bedtime. Replaces: NovoLOG FlexPen 100 UNIT/ML FlexPen   insulin aspart 100 UNIT/ML injection Commonly known as: novoLOG Inject 0-15 Units into the skin 3 (three) times daily with meals.   insulin glargine-yfgn 100 UNIT/ML injection Commonly known as: SEMGLEE Inject 0.1 mLs (10 Units total) into the skin at bedtime.   ipratropium-albuterol 0.5-2.5 (3) MG/3ML Soln Commonly known as: DUONEB Take 3 mLs by nebulization every 6 (six) hours as needed.   ketoconazole 2 % shampoo Commonly known as: NIZORAL Apply 1 Application topically 2 (two) times a week.   lamoTRIgine 150 MG tablet Commonly known as: LAMICTAL Take 1 tablet (150 mg total) by mouth 2 (two) times daily. What changed:  medication strength how much to take   levothyroxine 50 MCG tablet Commonly known as: SYNTHROID TAKE 1 TABLET DAILY SIX DAYS A WEEK AND 1/2 TABLET ON ONE DAY A WEEK What changed: See the new instructions.   LORazepam 0.5 MG tablet Commonly known as: ATIVAN Take 0.5 tablets (0.25 mg total) by mouth 2 (two) times daily for 7 days. What changed: how much to take   Mattress Pad Misc FIRM MATTRESS PAD for hospital bed x 1   melatonin 3 MG Tabs tablet Take 1 tablet (3 mg total) by mouth at bedtime as needed.   mirabegron ER 50 MG Tb24 tablet Commonly known as: MYRBETRIQ Take 1 tablet (50 mg total) by mouth daily.   montelukast 10 MG tablet Commonly known as: SINGULAIR Take 10 mg by mouth daily.   MULTIVITAMIN GUMMIES ADULT PO Take 1 tablet by mouth daily.   ondansetron 4 MG tablet Commonly known as: Zofran Take  one tablet by mouth once daily, as needed , for nausea   polyethylene glycol 17 g packet Commonly known as: MIRALAX / GLYCOLAX Take 17 g by mouth daily as needed.   pregabalin 75 MG capsule Commonly known as: LYRICA Take 1 capsule (75 mg total) by mouth daily.   RABEprazole 20 MG tablet Commonly known as: ACIPHEX TAKE 1 TABLET TWICE DAILY   Restasis 0.05 % ophthalmic emulsion Generic drug: cycloSPORINE Place 2 drops into both eyes daily.   rosuvastatin 5 MG tablet Commonly known as: CRESTOR TAKE 1 TABLET AT BEDTIME   senna-docusate 8.6-50 MG tablet Commonly known as: Senokot-S Take 1 tablet by mouth 2 (two) times daily.   sertraline 100 MG tablet Commonly known as: ZOLOFT Take 1 tablet (100 mg total) by mouth every morning.   UNABLE TO FIND Med Name:  G5 Mattress   vitamin  E 180 MG (400 UNITS) capsule Take 400 Units by mouth daily.         The results of significant diagnostics from this hospitalization (including imaging, microbiology, ancillary and laboratory) are listed below for reference.    Procedures and Diagnostic Studies:   DG CHEST PORT 1 VIEW  Result Date: 01/15/2023 CLINICAL DATA:  Dyspnea. EXAM: PORTABLE CHEST 1 VIEW COMPARISON:  January 12, 2023. FINDINGS: Patient is rotated to the right. The heart size and mediastinal contours are within normal limits. Both lungs are clear. Status post left shoulder arthroplasty. IMPRESSION: No active disease. Electronically Signed   By: Lupita Raider M.D.   On: 01/15/2023 14:47   EEG adult  Result Date: 01/15/2023 Charlsie Quest, MD     01/16/2023  8:39 AM Patient Name: [name] MRN: [id number] Epilepsy Attending: Charlsie Quest Referring Physician/Provider: Catarina Hartshorn, MD Date: 01/15/2023 Duration: 24.13 mins Patient history: 72yo F with ams getting eeg to evaluate for seizure Level of alertness: awake AEDs during EEG study: LEV, LTG Technical aspects: This EEG study was done with scalp electrodes positioned  according to the 10-20 International system of electrode placement. Electrical activity was reviewed with band pass filter of 1-70Hz , sensitivity of 7 uV/mm, display speed of 56mm/sec with a 60Hz  notched filter applied as appropriate. EEG data were recorded continuously and digitally stored.  Video monitoring was available and reviewed as appropriate. Description: The posterior dominant rhythm consists of 8 Hz activity of moderate voltage (25-35 uV) seen predominantly in posterior head regions, symmetric and reactive to eye opening and eye closing. Hyperventilation and photic stimulation were not performed.   IMPRESSION: This study is within normal limits. No seizures or epileptiform discharges were seen throughout the recording. A normal interictal EEG does not exclude the diagnosis of epilepsy. Priyanka Annabelle Harman     Labs:   Basic Metabolic Panel: Recent Labs  Lab 01/23/23 1124 01/24/23 1000 01/26/23 0449  NA 138 138  --   K 3.8 3.6  --   CL 104 107  --   CO2 24 21*  --   GLUCOSE 108* 126*  --   BUN 30* 26*  --   CREATININE 1.46* 1.25* 1.09*  CALCIUM 9.5 9.1  --    GFR Estimated Creatinine Clearance: 39.8 mL/min (A) (by C-G formula based on SCr of 1.09 mg/dL (H)). Liver Function Tests: Recent Labs  Lab 01/23/23 1124 01/24/23 1000  AST 24 20  ALT 22 19  ALKPHOS 72 70  BILITOT 0.4 0.9  PROT 6.9 6.6  ALBUMIN 3.8 3.7   No results for input(s): "LIPASE", "AMYLASE" in the last 168 hours. No results for input(s): "AMMONIA" in the last 168 hours. Coagulation profile No results for input(s): "INR", "PROTIME" in the last 168 hours.  CBC: Recent Labs  Lab 01/23/23 1124 01/24/23 1000  WBC 8.2 7.0  HGB 9.7* 9.5*  HCT 33.0* 32.2*  MCV 73.0* 75.4*  PLT 292 263   Cardiac Enzymes: No results for input(s): "CKTOTAL", "CKMB", "CKMBINDEX", "TROPONINI" in the last 168 hours. BNP: Invalid input(s): "POCBNP" CBG: Recent Labs  Lab 01/26/23 1215 01/26/23 1548 01/26/23 1938  01/26/23 2142 01/27/23 0606  GLUCAP 157* 190* 149* 131* 120*   D-Dimer No results for input(s): "DDIMER" in the last 72 hours. Hgb A1c No results for input(s): "HGBA1C" in the last 72 hours. Lipid Profile No results for input(s): "CHOL", "HDL", "LDLCALC", "TRIG", "CHOLHDL", "LDLDIRECT" in the last 72 hours. Thyroid function studies No results for  input(s): "TSH", "T4TOTAL", "T3FREE", "THYROIDAB" in the last 72 hours.  Invalid input(s): "FREET3" Anemia work up No results for input(s): "VITAMINB12", "FOLATE", "FERRITIN", "TIBC", "IRON", "RETICCTPCT" in the last 72 hours. Microbiology Recent Results (from the past 240 hour(s))  MRSA Next Gen by PCR, Nasal     Status: None   Collection Time: 01/17/23 10:45 AM   Specimen: Nasal Mucosa; Nasal Swab  Result Value Ref Range Status   MRSA by PCR Next Gen NOT DETECTED NOT DETECTED Final    Comment: Performed at [facility], [address], [place], [place] [address]    Time coordinating discharge: 45 minutes  Signed: Melina Schools Idonna Heeren  [facility] 01/27/2023, 9:52 AM

## 2023-01-26 NOTE — Plan of Care (Signed)

## 2023-01-26 NOTE — Progress Notes (Signed)
MD did come to the bedside to assess her and he cancelled her discharge and will keep her here another night to ensure she is ok.  She was upset and concerned that she will not have a bed for tomorrow.  She was told that if she is ok to go tomorrow, she can go because they will have a bed for her.  Said she does not have anymore dizziness right now.

## 2023-01-26 NOTE — Plan of Care (Signed)
  Problem: Clinical Measurements: Goal: Will remain free from infection Outcome: Progressing   Problem: Activity: Goal: Risk for activity intolerance will decrease Outcome: Progressing   Problem: Nutrition: Goal: Adequate nutrition will be maintained Outcome: Progressing   Problem: Safety: Goal: Ability to remain free from injury will improve Outcome: Progressing   Problem: Metabolic: Goal: Ability to maintain appropriate glucose levels will improve Outcome: Progressing

## 2023-01-26 NOTE — Progress Notes (Signed)
Pt called for nurse because she said she does not feel well.  Said she feels very dizzy like something is wrong.  Said she felt like this a day or 2 ago and also like this before she came into the hospital.  VSS.  No neuro changes.  Will Gouin MD because she said she does not want to leave feeling this way because she will just end up back here.

## 2023-01-26 NOTE — TOC Progression Note (Signed)
Transition of Care [facility]) - Progression Note    Patient Details  Name: [name] MRN: [id number] Date of Birth: 08/13/1950  [facility] CM/SW Contact  Baldemar [place], [place] Phone Number: 01/26/2023, 3:38 PM  Clinical Narrative:   CSW updated by RN and MD that patient is not feeling right, canceling discharge for today. CSW updated [facility], they can accept tomorrow if patient is stable. PTAR canceled. CSW to follow.    Expected Discharge Plan: Skilled Nursing Facility Barriers to Discharge: Continued Medical Work up  Expected Discharge Plan and Services     Post Acute Care Choice: Home Health Living arrangements for the past 2 months: Single Family Home Expected Discharge Date: 01/26/23                         HH Arranged: PT, OT, Social Work, Refused SNF HH Agency: [facility] Date [facility] Agency Contacted: 01/13/23 Time HH Agency Contacted: 1240 Representative spoke with at [facility] Agency: Cindie   Social Determinants of Health (SDOH) Interventions SDOH Screenings   Food Insecurity: No Food Insecurity (01/12/2023)  Housing: Patient Declined (01/12/2023)  Transportation Needs: No Transportation Needs (01/12/2023)  Utilities: Not At Risk (01/12/2023)  Alcohol Screen: Low Risk  (07/17/2022)  Depression (PHQ2-9): Low Risk  (12/12/2022)  Financial Resource Strain: Low Risk  (07/17/2022)  Physical Activity: Insufficiently Active (07/17/2022)  Social Connections: Moderately Integrated (07/17/2022)  Stress: No Stress Concern Present (07/17/2022)  Tobacco Use: High Risk (01/13/2023)  Health Literacy: Adequate Health Literacy (12/14/2022)    Readmission Risk Interventions    08/21/2022   11:54 AM 09/16/2021   10:07 AM  Readmission Risk Prevention Plan  Transportation Screening Complete Complete  Medication Review (RN Care Manager) Complete Complete  PCP or Specialist appointment within 3-5 days of discharge Complete   HRI or Home Care Consult Complete  Complete  SW Recovery Care/Counseling Consult Complete Complete  Palliative Care Screening Not Applicable Not Applicable  Skilled Nursing Facility Not Applicable Patient Refused

## 2023-01-27 DIAGNOSIS — E039 Hypothyroidism, unspecified: Secondary | ICD-10-CM | POA: Diagnosis not present

## 2023-01-27 DIAGNOSIS — Z7982 Long term (current) use of aspirin: Secondary | ICD-10-CM | POA: Diagnosis not present

## 2023-01-27 DIAGNOSIS — Z8673 Personal history of transient ischemic attack (TIA), and cerebral infarction without residual deficits: Secondary | ICD-10-CM | POA: Diagnosis not present

## 2023-01-27 DIAGNOSIS — J449 Chronic obstructive pulmonary disease, unspecified: Secondary | ICD-10-CM | POA: Diagnosis not present

## 2023-01-27 DIAGNOSIS — M542 Cervicalgia: Secondary | ICD-10-CM | POA: Diagnosis not present

## 2023-01-27 DIAGNOSIS — G40909 Epilepsy, unspecified, not intractable, without status epilepticus: Secondary | ICD-10-CM | POA: Diagnosis not present

## 2023-01-27 DIAGNOSIS — Z471 Aftercare following joint replacement surgery: Secondary | ICD-10-CM | POA: Diagnosis not present

## 2023-01-27 DIAGNOSIS — E119 Type 2 diabetes mellitus without complications: Secondary | ICD-10-CM | POA: Diagnosis not present

## 2023-01-27 DIAGNOSIS — F17210 Nicotine dependence, cigarettes, uncomplicated: Secondary | ICD-10-CM | POA: Diagnosis not present

## 2023-01-27 DIAGNOSIS — Z043 Encounter for examination and observation following other accident: Secondary | ICD-10-CM | POA: Diagnosis not present

## 2023-01-27 DIAGNOSIS — N1831 Chronic kidney disease, stage 3a: Secondary | ICD-10-CM | POA: Diagnosis not present

## 2023-01-27 DIAGNOSIS — N189 Chronic kidney disease, unspecified: Secondary | ICD-10-CM | POA: Diagnosis not present

## 2023-01-27 DIAGNOSIS — G4089 Other seizures: Secondary | ICD-10-CM | POA: Diagnosis not present

## 2023-01-27 DIAGNOSIS — F411 Generalized anxiety disorder: Secondary | ICD-10-CM | POA: Diagnosis not present

## 2023-01-27 DIAGNOSIS — G4489 Other headache syndrome: Secondary | ICD-10-CM | POA: Diagnosis not present

## 2023-01-27 DIAGNOSIS — Z9181 History of falling: Secondary | ICD-10-CM | POA: Diagnosis not present

## 2023-01-27 DIAGNOSIS — D631 Anemia in chronic kidney disease: Secondary | ICD-10-CM | POA: Diagnosis not present

## 2023-01-27 DIAGNOSIS — S0003XA Contusion of scalp, initial encounter: Secondary | ICD-10-CM | POA: Diagnosis not present

## 2023-01-27 DIAGNOSIS — Z79890 Hormone replacement therapy (postmenopausal): Secondary | ICD-10-CM | POA: Diagnosis not present

## 2023-01-27 DIAGNOSIS — S2241XA Multiple fractures of ribs, right side, initial encounter for closed fracture: Secondary | ICD-10-CM | POA: Diagnosis not present

## 2023-01-27 DIAGNOSIS — Z96612 Presence of left artificial shoulder joint: Secondary | ICD-10-CM | POA: Diagnosis not present

## 2023-01-27 DIAGNOSIS — M6281 Muscle weakness (generalized): Secondary | ICD-10-CM | POA: Diagnosis not present

## 2023-01-27 DIAGNOSIS — Z79899 Other long term (current) drug therapy: Secondary | ICD-10-CM | POA: Diagnosis not present

## 2023-01-27 DIAGNOSIS — R220 Localized swelling, mass and lump, head: Secondary | ICD-10-CM | POA: Diagnosis not present

## 2023-01-27 DIAGNOSIS — S0990XA Unspecified injury of head, initial encounter: Secondary | ICD-10-CM | POA: Diagnosis not present

## 2023-01-27 DIAGNOSIS — M4802 Spinal stenosis, cervical region: Secondary | ICD-10-CM | POA: Diagnosis not present

## 2023-01-27 DIAGNOSIS — G4700 Insomnia, unspecified: Secondary | ICD-10-CM | POA: Diagnosis not present

## 2023-01-27 DIAGNOSIS — S199XXA Unspecified injury of neck, initial encounter: Secondary | ICD-10-CM | POA: Diagnosis not present

## 2023-01-27 DIAGNOSIS — E1169 Type 2 diabetes mellitus with other specified complication: Secondary | ICD-10-CM | POA: Diagnosis not present

## 2023-01-27 DIAGNOSIS — R451 Restlessness and agitation: Secondary | ICD-10-CM | POA: Diagnosis not present

## 2023-01-27 DIAGNOSIS — Z7401 Bed confinement status: Secondary | ICD-10-CM | POA: Diagnosis not present

## 2023-01-27 DIAGNOSIS — W1830XA Fall on same level, unspecified, initial encounter: Secondary | ICD-10-CM | POA: Diagnosis not present

## 2023-01-27 DIAGNOSIS — F4323 Adjustment disorder with mixed anxiety and depressed mood: Secondary | ICD-10-CM | POA: Diagnosis not present

## 2023-01-27 DIAGNOSIS — M47816 Spondylosis without myelopathy or radiculopathy, lumbar region: Secondary | ICD-10-CM | POA: Diagnosis not present

## 2023-01-27 DIAGNOSIS — I129 Hypertensive chronic kidney disease with stage 1 through stage 4 chronic kidney disease, or unspecified chronic kidney disease: Secondary | ICD-10-CM | POA: Diagnosis not present

## 2023-01-27 DIAGNOSIS — Y92128 Other place in nursing home as the place of occurrence of the external cause: Secondary | ICD-10-CM | POA: Diagnosis not present

## 2023-01-27 DIAGNOSIS — I6523 Occlusion and stenosis of bilateral carotid arteries: Secondary | ICD-10-CM | POA: Diagnosis not present

## 2023-01-27 DIAGNOSIS — D329 Benign neoplasm of meninges, unspecified: Secondary | ICD-10-CM | POA: Diagnosis not present

## 2023-01-27 DIAGNOSIS — W0110XA Fall on same level from slipping, tripping and stumbling with subsequent striking against unspecified object, initial encounter: Secondary | ICD-10-CM | POA: Diagnosis not present

## 2023-01-27 DIAGNOSIS — D509 Iron deficiency anemia, unspecified: Secondary | ICD-10-CM | POA: Diagnosis not present

## 2023-01-27 DIAGNOSIS — R519 Headache, unspecified: Secondary | ICD-10-CM | POA: Diagnosis present

## 2023-01-27 DIAGNOSIS — W19XXXA Unspecified fall, initial encounter: Secondary | ICD-10-CM | POA: Diagnosis not present

## 2023-01-27 DIAGNOSIS — I10 Essential (primary) hypertension: Secondary | ICD-10-CM | POA: Diagnosis not present

## 2023-01-27 DIAGNOSIS — S0993XA Unspecified injury of face, initial encounter: Secondary | ICD-10-CM | POA: Diagnosis not present

## 2023-01-27 DIAGNOSIS — F339 Major depressive disorder, recurrent, unspecified: Secondary | ICD-10-CM | POA: Diagnosis not present

## 2023-01-27 DIAGNOSIS — M4312 Spondylolisthesis, cervical region: Secondary | ICD-10-CM | POA: Diagnosis not present

## 2023-01-27 DIAGNOSIS — R569 Unspecified convulsions: Secondary | ICD-10-CM | POA: Diagnosis not present

## 2023-01-27 DIAGNOSIS — Z794 Long term (current) use of insulin: Secondary | ICD-10-CM | POA: Diagnosis not present

## 2023-01-27 DIAGNOSIS — G9389 Other specified disorders of brain: Secondary | ICD-10-CM | POA: Diagnosis not present

## 2023-01-27 LAB — GLUCOSE, CAPILLARY: Glucose-Capillary: 120 mg/dL — ABNORMAL HIGH (ref 70–99)

## 2023-01-27 NOTE — TOC Transition Note (Signed)
Transition of Care [facility]) - CM/SW Discharge Note   Patient Details  Name: [name] MRN: [id number] Date of Birth: Jun 15, 1950  Transition [facility]) CM/SW Contact:  Deatra Robinson, [place] Phone Number: 01/27/2023, 9:59 AM   Clinical Narrative: Per MD, pt stable for dc to [facility] today. Spoke to [place] in admissions who confirmed they are prepared to admit pt to room A26-2. Pt's niece Merdis Delay aware of dc and reports agreeable. RN provided with number for report, medical necessity form updated, and PTAR arranged for transport. SW signing off at dc.   Dellie Burns, MSW, LCSW [phone] (coverage)       Final next level of care: Skilled Nursing Facility Barriers to Discharge: Continued Medical Work up   Patient Goals and CMS Choice CMS Medicare.gov Compare Post Acute Care list provided to:: Patient Represenative (must comment) (POA) Choice offered to / list presented to : [facility] POA / Guardian  Discharge Placement                Patient chooses bed at:  [facility]) Patient to be transferred to facility by: PTAR Name of family member notified: Merdis Delay Patient and family notified of of transfer: 01/26/23  Discharge Plan and Services Additional resources added to the After Visit Summary for       Post Acute Care Choice: Home Health                    HH Arranged: PT, OT, Social Work, Refused SNF HH Agency: [facility] Date [facility] Agency Contacted: 01/13/23 Time HH Agency Contacted: [phone] Representative spoke with at [facility] Agency: Cindie  Social Determinants of Health (SDOH) Interventions SDOH Screenings   Food Insecurity: No Food Insecurity (01/12/2023)  Housing: Patient Declined (01/12/2023)  Transportation Needs: No Transportation Needs (01/12/2023)  Utilities: Not At Risk (01/12/2023)  Alcohol Screen: Low Risk  (07/17/2022)  Depression (PHQ2-9): Low Risk  (12/12/2022)  Financial Resource Strain: Low Risk  (07/17/2022)  Physical  Activity: Insufficiently Active (07/17/2022)  Social Connections: Moderately Integrated (07/17/2022)  Stress: No Stress Concern Present (07/17/2022)  Tobacco Use: High Risk (01/13/2023)  Health Literacy: Adequate Health Literacy (12/14/2022)     Readmission Risk Interventions    08/21/2022   11:54 AM 09/16/2021   10:07 AM  Readmission Risk Prevention Plan  Transportation Screening Complete Complete  Medication Review (RN Care Manager) Complete Complete  PCP or Specialist appointment within 3-5 days of discharge Complete   HRI or Home Care Consult Complete Complete  SW Recovery Care/Counseling Consult Complete Complete  Palliative Care Screening Not Applicable Not Applicable  Skilled Nursing Facility Not Applicable Patient Refused

## 2023-01-27 NOTE — Plan of Care (Signed)
  Problem: Education: Goal: Knowledge of General Education information will improve Description: Including pain rating scale, medication(s)/side effects and non-pharmacologic comfort measures Outcome: Adequate for Discharge   Problem: Health Behavior/Discharge Planning: Goal: Ability to manage health-related needs will improve Outcome: Adequate for Discharge   Problem: Clinical Measurements: Goal: Ability to maintain clinical measurements within normal limits will improve Outcome: Adequate for Discharge Goal: Will remain free from infection Outcome: Adequate for Discharge   Problem: Activity: Goal: Risk for activity intolerance will decrease Outcome: Adequate for Discharge   Problem: Nutrition: Goal: Adequate nutrition will be maintained Outcome: Adequate for Discharge   Problem: Elimination: Goal: Will not experience complications related to bowel motility Outcome: Adequate for Discharge Goal: Will not experience complications related to urinary retention Outcome: Adequate for Discharge   Problem: Pain Management: Goal: General experience of comfort will improve Outcome: Adequate for Discharge   Problem: Safety: Goal: Ability to remain free from injury will improve Outcome: Adequate for Discharge   Problem: Coping: Goal: Ability to adjust to condition or change in health will improve Outcome: Adequate for Discharge   Problem: Health Behavior/Discharge Planning: Goal: Ability to identify and utilize available resources and services will improve Outcome: Adequate for Discharge Goal: Ability to manage health-related needs will improve Outcome: Adequate for Discharge   Problem: Metabolic: Goal: Ability to maintain appropriate glucose levels will improve Outcome: Adequate for Discharge   Problem: Safety: Goal: Non-violent Restraint(s) Outcome: Adequate for Discharge

## 2023-01-27 NOTE — Progress Notes (Signed)
Patient was prepared for discharge yesterday. However in the afternoon, patient felt dizzy and hence she was monitored overnight. No recurrence of symptoms.  Feels better today. Okay to discharge today.  Discharge summary date updated. [facility] will not bill for today

## 2023-01-27 NOTE — Plan of Care (Signed)
  Problem: Education: Goal: Knowledge of General Education information will improve Description: Including pain rating scale, medication(s)/side effects and non-pharmacologic comfort measures Outcome: Progressing   Problem: Health Behavior/Discharge Planning: Goal: Ability to manage health-related needs will improve Outcome: Progressing   Problem: Activity: Goal: Risk for activity intolerance will decrease Outcome: Progressing   

## 2023-01-29 ENCOUNTER — Ambulatory Visit: Payer: Medicare HMO | Admitting: Urology

## 2023-01-29 DIAGNOSIS — N3281 Overactive bladder: Secondary | ICD-10-CM

## 2023-01-29 DIAGNOSIS — F411 Generalized anxiety disorder: Secondary | ICD-10-CM | POA: Diagnosis not present

## 2023-01-29 DIAGNOSIS — G40909 Epilepsy, unspecified, not intractable, without status epilepticus: Secondary | ICD-10-CM | POA: Diagnosis not present

## 2023-01-29 DIAGNOSIS — M6281 Muscle weakness (generalized): Secondary | ICD-10-CM | POA: Diagnosis not present

## 2023-01-29 DIAGNOSIS — N189 Chronic kidney disease, unspecified: Secondary | ICD-10-CM | POA: Diagnosis not present

## 2023-01-29 DIAGNOSIS — E119 Type 2 diabetes mellitus without complications: Secondary | ICD-10-CM | POA: Diagnosis not present

## 2023-01-29 DIAGNOSIS — J449 Chronic obstructive pulmonary disease, unspecified: Secondary | ICD-10-CM | POA: Diagnosis not present

## 2023-01-29 DIAGNOSIS — I10 Essential (primary) hypertension: Secondary | ICD-10-CM | POA: Diagnosis not present

## 2023-01-29 DIAGNOSIS — N3946 Mixed incontinence: Secondary | ICD-10-CM

## 2023-01-29 DIAGNOSIS — F339 Major depressive disorder, recurrent, unspecified: Secondary | ICD-10-CM | POA: Diagnosis not present

## 2023-01-31 DIAGNOSIS — I10 Essential (primary) hypertension: Secondary | ICD-10-CM | POA: Diagnosis not present

## 2023-01-31 DIAGNOSIS — G40909 Epilepsy, unspecified, not intractable, without status epilepticus: Secondary | ICD-10-CM | POA: Diagnosis not present

## 2023-01-31 DIAGNOSIS — F411 Generalized anxiety disorder: Secondary | ICD-10-CM | POA: Diagnosis not present

## 2023-01-31 DIAGNOSIS — M6281 Muscle weakness (generalized): Secondary | ICD-10-CM | POA: Diagnosis not present

## 2023-01-31 DIAGNOSIS — J449 Chronic obstructive pulmonary disease, unspecified: Secondary | ICD-10-CM | POA: Diagnosis not present

## 2023-01-31 DIAGNOSIS — E119 Type 2 diabetes mellitus without complications: Secondary | ICD-10-CM | POA: Diagnosis not present

## 2023-01-31 DIAGNOSIS — N189 Chronic kidney disease, unspecified: Secondary | ICD-10-CM | POA: Diagnosis not present

## 2023-01-31 DIAGNOSIS — F339 Major depressive disorder, recurrent, unspecified: Secondary | ICD-10-CM | POA: Diagnosis not present

## 2023-02-01 DIAGNOSIS — J449 Chronic obstructive pulmonary disease, unspecified: Secondary | ICD-10-CM | POA: Diagnosis not present

## 2023-02-01 DIAGNOSIS — F339 Major depressive disorder, recurrent, unspecified: Secondary | ICD-10-CM | POA: Diagnosis not present

## 2023-02-01 DIAGNOSIS — F411 Generalized anxiety disorder: Secondary | ICD-10-CM | POA: Diagnosis not present

## 2023-02-01 DIAGNOSIS — E039 Hypothyroidism, unspecified: Secondary | ICD-10-CM | POA: Diagnosis not present

## 2023-02-01 DIAGNOSIS — E1169 Type 2 diabetes mellitus with other specified complication: Secondary | ICD-10-CM | POA: Diagnosis not present

## 2023-02-01 DIAGNOSIS — G40909 Epilepsy, unspecified, not intractable, without status epilepticus: Secondary | ICD-10-CM | POA: Diagnosis not present

## 2023-02-01 DIAGNOSIS — M6281 Muscle weakness (generalized): Secondary | ICD-10-CM | POA: Diagnosis not present

## 2023-02-01 DIAGNOSIS — N189 Chronic kidney disease, unspecified: Secondary | ICD-10-CM | POA: Diagnosis not present

## 2023-02-02 DIAGNOSIS — M6281 Muscle weakness (generalized): Secondary | ICD-10-CM | POA: Diagnosis not present

## 2023-02-02 DIAGNOSIS — F339 Major depressive disorder, recurrent, unspecified: Secondary | ICD-10-CM | POA: Diagnosis not present

## 2023-02-02 DIAGNOSIS — N189 Chronic kidney disease, unspecified: Secondary | ICD-10-CM | POA: Diagnosis not present

## 2023-02-02 DIAGNOSIS — G40909 Epilepsy, unspecified, not intractable, without status epilepticus: Secondary | ICD-10-CM | POA: Diagnosis not present

## 2023-02-02 DIAGNOSIS — J449 Chronic obstructive pulmonary disease, unspecified: Secondary | ICD-10-CM | POA: Diagnosis not present

## 2023-02-02 DIAGNOSIS — E119 Type 2 diabetes mellitus without complications: Secondary | ICD-10-CM | POA: Diagnosis not present

## 2023-02-02 DIAGNOSIS — F411 Generalized anxiety disorder: Secondary | ICD-10-CM | POA: Diagnosis not present

## 2023-02-02 DIAGNOSIS — I10 Essential (primary) hypertension: Secondary | ICD-10-CM | POA: Diagnosis not present

## 2023-02-03 ENCOUNTER — Other Ambulatory Visit: Payer: Self-pay

## 2023-02-03 ENCOUNTER — Encounter (HOSPITAL_COMMUNITY): Payer: Self-pay

## 2023-02-03 ENCOUNTER — Emergency Department (HOSPITAL_COMMUNITY): Payer: Medicare HMO

## 2023-02-03 ENCOUNTER — Emergency Department (HOSPITAL_COMMUNITY)
Admission: EM | Admit: 2023-02-03 | Discharge: 2023-02-03 | Disposition: A | Discharge status: Home / Self Care | Payer: Medicare HMO | Attending: Student | Admitting: Student

## 2023-02-03 DIAGNOSIS — J449 Chronic obstructive pulmonary disease, unspecified: Secondary | ICD-10-CM | POA: Diagnosis not present

## 2023-02-03 DIAGNOSIS — S0990XA Unspecified injury of head, initial encounter: Secondary | ICD-10-CM | POA: Diagnosis not present

## 2023-02-03 DIAGNOSIS — I10 Essential (primary) hypertension: Secondary | ICD-10-CM | POA: Insufficient documentation

## 2023-02-03 DIAGNOSIS — Z794 Long term (current) use of insulin: Secondary | ICD-10-CM | POA: Diagnosis not present

## 2023-02-03 DIAGNOSIS — F17210 Nicotine dependence, cigarettes, uncomplicated: Secondary | ICD-10-CM | POA: Diagnosis not present

## 2023-02-03 DIAGNOSIS — G4489 Other headache syndrome: Secondary | ICD-10-CM | POA: Diagnosis not present

## 2023-02-03 DIAGNOSIS — R519 Headache, unspecified: Secondary | ICD-10-CM | POA: Diagnosis present

## 2023-02-03 DIAGNOSIS — G9389 Other specified disorders of brain: Secondary | ICD-10-CM | POA: Diagnosis not present

## 2023-02-03 DIAGNOSIS — S199XXA Unspecified injury of neck, initial encounter: Secondary | ICD-10-CM | POA: Diagnosis not present

## 2023-02-03 DIAGNOSIS — M47816 Spondylosis without myelopathy or radiculopathy, lumbar region: Secondary | ICD-10-CM | POA: Diagnosis not present

## 2023-02-03 DIAGNOSIS — Z79890 Hormone replacement therapy (postmenopausal): Secondary | ICD-10-CM | POA: Insufficient documentation

## 2023-02-03 DIAGNOSIS — Y92128 Other place in nursing home as the place of occurrence of the external cause: Secondary | ICD-10-CM | POA: Insufficient documentation

## 2023-02-03 DIAGNOSIS — S2241XA Multiple fractures of ribs, right side, initial encounter for closed fracture: Secondary | ICD-10-CM | POA: Diagnosis not present

## 2023-02-03 DIAGNOSIS — E039 Hypothyroidism, unspecified: Secondary | ICD-10-CM | POA: Diagnosis not present

## 2023-02-03 DIAGNOSIS — Z7982 Long term (current) use of aspirin: Secondary | ICD-10-CM | POA: Diagnosis not present

## 2023-02-03 DIAGNOSIS — W0110XA Fall on same level from slipping, tripping and stumbling with subsequent striking against unspecified object, initial encounter: Secondary | ICD-10-CM | POA: Diagnosis not present

## 2023-02-03 DIAGNOSIS — Z8673 Personal history of transient ischemic attack (TIA), and cerebral infarction without residual deficits: Secondary | ICD-10-CM | POA: Diagnosis not present

## 2023-02-03 DIAGNOSIS — Z043 Encounter for examination and observation following other accident: Secondary | ICD-10-CM | POA: Diagnosis not present

## 2023-02-03 DIAGNOSIS — Z79899 Other long term (current) drug therapy: Secondary | ICD-10-CM | POA: Insufficient documentation

## 2023-02-03 DIAGNOSIS — S0993XA Unspecified injury of face, initial encounter: Secondary | ICD-10-CM | POA: Diagnosis not present

## 2023-02-03 DIAGNOSIS — E119 Type 2 diabetes mellitus without complications: Secondary | ICD-10-CM | POA: Insufficient documentation

## 2023-02-03 DIAGNOSIS — Z471 Aftercare following joint replacement surgery: Secondary | ICD-10-CM | POA: Diagnosis not present

## 2023-02-03 DIAGNOSIS — Z96612 Presence of left artificial shoulder joint: Secondary | ICD-10-CM | POA: Diagnosis not present

## 2023-02-03 DIAGNOSIS — W19XXXA Unspecified fall, initial encounter: Secondary | ICD-10-CM

## 2023-02-03 LAB — COMPREHENSIVE METABOLIC PANEL
ALT: 17 U/L (ref 0–44)
AST: 18 U/L (ref 15–41)
Albumin: 3.9 g/dL (ref 3.5–5.0)
Alkaline Phosphatase: 81 U/L (ref 38–126)
Anion gap: 5 (ref 5–15)
BUN: 18 mg/dL (ref 8–23)
CO2: 28 mmol/L (ref 22–32)
Calcium: 9.6 mg/dL (ref 8.9–10.3)
Chloride: 111 mmol/L (ref 98–111)
Creatinine, Ser: 1.16 mg/dL — ABNORMAL HIGH (ref 0.44–1.00)
GFR, Estimated: 50 mL/min — ABNORMAL LOW (ref >60)
Glucose, Bld: 139 mg/dL — ABNORMAL HIGH (ref 70–99)
Potassium: 3.9 mmol/L (ref 3.5–5.1)
Sodium: 144 mmol/L (ref 135–145)
Total Bilirubin: 0.5 mg/dL (ref <1.2)
Total Protein: 7.2 g/dL (ref 6.5–8.1)

## 2023-02-03 LAB — CBC WITH DIFFERENTIAL/PLATELET
Abs Immature Granulocytes: 0.03 10*3/uL (ref 0.00–0.07)
Basophils Absolute: 0 10*3/uL (ref 0.0–0.1)
Basophils Relative: 1 %
Eosinophils Absolute: 0.1 10*3/uL (ref 0.0–0.5)
Eosinophils Relative: 3 %
HCT: 33.8 % — ABNORMAL LOW (ref 36.0–46.0)
Hemoglobin: 9.6 g/dL — ABNORMAL LOW (ref 12.0–15.0)
Immature Granulocytes: 1 %
Lymphocytes Relative: 31 %
Lymphs Abs: 1.6 10*3/uL (ref 0.7–4.0)
MCH: 22 pg — ABNORMAL LOW (ref 26.0–34.0)
MCHC: 28.4 g/dL — ABNORMAL LOW (ref 30.0–36.0)
MCV: 77.3 fL — ABNORMAL LOW (ref 80.0–100.0)
Monocytes Absolute: 0.5 10*3/uL (ref 0.1–1.0)
Monocytes Relative: 9 %
Neutro Abs: 2.9 10*3/uL (ref 1.7–7.7)
Neutrophils Relative %: 55 %
Platelets: 283 10*3/uL (ref 150–400)
RBC: 4.37 MIL/uL (ref 3.87–5.11)
RDW: 15.7 % — ABNORMAL HIGH (ref 11.5–15.5)
WBC: 5.1 10*3/uL (ref 4.0–10.5)
nRBC: 0 % (ref 0.0–0.2)

## 2023-02-03 MED ORDER — HYDROCODONE-ACETAMINOPHEN 5-325 MG PO TABS
1.0000 | ORAL_TABLET | Freq: Once | ORAL | Status: AC
  Administered 2023-02-03: 1 via ORAL
  Filled 2023-02-03: qty 1

## 2023-02-03 NOTE — ED Provider Notes (Signed)
[place] EMERGENCY DEPARTMENT AT [facility] Provider Note  CSN: [id number] Arrival date & time: 02/03/23 1458  Chief Complaint(s) Fall  HPI [name] is a 72 y.o. female with PMH T2DM, HTN, HLD, OSA on CPAP, previous CVA with left-sided deficits, hypothyroidism, seizure disorder, bipolar disorder, anemia who presents emerged department for evaluation of a fall.  Patient lives in a skilled nursing facility and was attempting to restroom when she fell and struck her head and face on the ground.  No blood thinner use.  Unknown loss of consciousness.  Endorses headache and facial pain on the left but denies chest pain, shortness of breath, abdominal pain, nausea, vomiting or other traumatic complaints.     Past Medical History Past Medical History:  Diagnosis Date   Allergy    Anemia    Anemia in chronic kidney disease (CKD) 08/10/2021   Anemia in chronic kidney disease (CKD) 10/12/2021   Anxiety    takes Ativan daily   Arthritis    Assistance needed for mobility    Bipolar disorder (HCC)    takes Risperdal nightly   Blood transfusion    Brain tumor (HCC)    Cancer (HCC)    In her gum   Carpal tunnel syndrome of right wrist 05/23/2011   Cervical disc disorder with radiculopathy of cervical region 10/31/2012   Chronic back pain    Chronic idiopathic constipation    Chronic neck and back pain    Colon polyps    COPD (chronic obstructive pulmonary disease) with chronic bronchitis (HCC) 09/16/2013   Office Spirometry 10/30/2013-submaximal effort based on appearance of loop and curve. Numbers would fit with severe restriction but her physiologic capability may be better than this. FVC 0.91/44%, and 10.74/45%, FEV1/FVC 0.81, FEF 25-75% 1.43/69%     Diabetes mellitus    Type II   Diverticulosis    TCS 9/08 by Dr. Lina Sar for diarrhea . Bx for micro scopic colitis negative.    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   Metabolic encephalopathy 08/03/2011   Migraines    chronic headaches   Mononeuritis lower limb    Narcolepsy    Osteoporosis    Pancreatitis 2006   due to Depakote with normal EUS    Paralysis (HCC)    Pneumonia    Schatzki's ring    non critical / EGD with ED 8/2011with RMR   Seizures (HCC)    takes Lamictal daily.Last seizure 3 yrs ago   Sleep apnea    on CPAP   Small bowel obstruction (HCC)    Stroke (HCC)    left sided weakness, speech changes   Tubular adenoma of colon    Patient Active Problem List   Diagnosis Date Noted   Acute metabolic encephalopathy 01/12/2023   History of CVA (cerebrovascular accident) 01/12/2023   Type 2 diabetes mellitus with diabetic chronic kidney disease (HCC)  12/14/2022   Lumbar radiculopathy 12/14/2022   Ataxia 10/25/2022   Lesion of skin of scalp 10/25/2022   Seborrheic dermatitis of scalp 10/15/2022   Dermatophytosis of nail 10/09/2022   Callus of foot 10/09/2022   Neuroma of foot 10/09/2022   History of shoulder surgery 10/04/2022   H/O total shoulder replacement, left 08/18/2022   Scalp cyst 08/10/2022   Right-sided chest pain 08/06/2022   CKD (chronic kidney disease) stage 3, GFR 30-59 ml/min (HCC) 07/03/2022   Skin tag 07/03/2022   Itchy scalp 07/03/2022   Left-sided chest pain 04/27/2022   Bilateral leg edema 12/21/2021   Hypoglycemia 12/20/2021   Skin lesions, generalized 12/19/2021   Insomnia due to stress 12/19/2021   Major depressive disorder 12/11/2021   Rotator cuff tear  arthropathy 11/15/2021   Anemia in chronic kidney disease (CKD) 10/12/2021   Dehydration 09/25/2021   CHF (congestive heart failure) (HCC) 09/16/2021   Urinary frequency 09/01/2021   Anemia of chronic disease 08/10/2021   Generalized pain 08/04/2021   Sciatica, right side 07/19/2021   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/10/2021   Atherosclerosis of aorta (HCC) 07/10/2021   Generalized weakness 05/12/2021   Trigger finger, right 12/12/2020   Confusion 11/24/2020   Blurry vision 11/18/2020   Cervical radiculopathy 07/09/2020   Headache 02/19/2020   Tubular adenoma of colon 02/09/2019   Benign neoplasm of cerebral meninges (HCC) 11/12/2018   Benign meningioma of brain (HCC) 10/31/2018   Osteoarthritis of both hips 07/06/2018   Left arm pain 06/11/2018   Lipoma of extremity 12/31/2017   Impingement syndrome of left shoulder region 12/27/2017   Leg weakness, bilateral 10/28/2017   Lumbar spondylosis with myelopathy 10/12/2017   Numbness of hand 10/10/2017   Chronic neck pain 07/25/2017   Chronic pain syndrome 07/25/2017   Diabetic polyneuropathy associated with type 2 diabetes mellitus (HCC) 05/19/2016   Tobacco use disorder 04/24/2016   Obesity (BMI 30.0-34.9) 04/24/2016   History of palpitations 08/09/2015   Nausea 08/09/2015   Labile hypertension 08/03/2015   Normal coronary arteries 08/03/2015   Dizziness 07/15/2015   Left-sided low back pain with left-sided sciatica 06/27/2015   Type 2 diabetes mellitus with hyperglycemia, with long-term current use of insulin (HCC) 05/06/2015   Multinodular goiter 05/06/2015   Rectocele, female 04/27/2015   Anal sphincter incontinence 04/27/2015   Pelvic relaxation due to rectocele 03/30/2015   Hyperkalemia 02/22/2015   Pulmonary hypertension (HCC) 02/22/2015   Posterior chest pain 02/21/2015   AKI (acute kidney injury) (HCC) 02/21/2015   Episodic cigarette smoking  dependence 01/11/2015   Migraine without aura and without status migrainosus, not intractable 07/02/2014   Microcytic anemia 02/18/2014   Hip pain, right 12/17/2013   COPD mixed type (HCC) 09/16/2013   Chronic obstructive pulmonary disease (HCC) 09/16/2013   Acquired hypothyroidism 08/16/2013   Gastroparesis 04/28/2013   Nicotine dependence 03/09/2013   Seizure disorder (HCC) 01/19/2013   Displacement of cervical intervertebral disc without myelopathy 12/13/2012   Bursitis of shoulder 12/13/2012   Cervical disc disorder with radiculopathy of cervical region 10/31/2012   Solitary pulmonary nodule 08/19/2012   Hypersomnia disorder related to a known organic factor 06/11/2012   Pruritus 04/18/2012   Meningioma (HCC) 11/19/2011   Mononeuritis leg 10/25/2011   Carpal tunnel syndrome of right wrist 05/23/2011   Chronic pain of right hand 05/04/2011   Polypharmacy 04/28/2011   Mood disorder (HCC) 04/28/2011   Constipation 04/13/2011   Recurrent falls 12/12/2010   Oropharyngeal dysphagia  07/12/2010   Urinary incontinence 12/16/2009   HEARING LOSS 10/26/2009   DMII (diabetes mellitus, type 2) (HCC) 07/07/2009   Mixed hyperlipidemia 12/11/2008   IBS 12/11/2008   GERD 07/29/2008   MILK PRODUCTS ALLERGY 07/29/2008   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 Authorizing Provider  lamoTRIgine (LAMICTAL) 150 MG tablet Take 1 tablet (150 mg total) by mouth 2 (two) times daily. 01/26/23 02/25/23 Yes Dahal, Melina Schools, MD  acetaminophen (TYLENOL) 325 MG tablet Take 650 mg by mouth every 6 (six) hours as needed for mild pain, moderate pain or headache.    [provider]   alendronate (FOSAMAX) 70 MG tablet TAKE 1 TABLET EVERY 7 DAYS ON AN EMPTY STOMACH WITH A FULL GLASS OF WATER Patient taking differently: Take 70 mg by mouth once a week. 11/07/21   Kerri Perches, MD  amLODipine (NORVASC) 5 MG tablet Take 1 tablet (5 mg total) by mouth daily. 01/17/23   Catarina Hartshorn, MD  aspirin EC 81 MG tablet Take 1 tablet (81 mg total) by mouth daily with breakfast. 12/08/18   Shon Hale, MD  Blood Glucose Calibration (ACCU-CHEK GUIDE CONTROL) LIQD USE AS DIRECTED 12/13/20   Carlus Pavlov, MD  blood glucose meter kit and supplies Dispense based on patient and insurance preference. Use up to four times daily as directed. (FOR ICD-10 E10.9, E11.9). 06/27/19   Carlus Pavlov, MD  carvedilol (COREG) 12.5 MG tablet Take 12.5 mg by mouth 2 (two) times daily with a meal.    [provider]  Cholecalciferol (D3 PO) Take 1 tablet by mouth daily.    [provider]  Continuous Blood Gluc Sensor (FREESTYLE LIBRE 14 DAY SENSOR) MISC 1 each by Does not apply route every 14 (fourteen) days. Change every 2 weeks 06/24/19   Carlus Pavlov, MD  Docusate Sodium (DSS) 100 MG CAPS Take 100 mg by mouth in the morning and at bedtime.    [provider]  DROPLET PEN NEEDLES 31G X 8 MM MISC USE FOR INJECTING INSULIN 4 TIMES DAILY. 09/02/21   Carlus Pavlov, MD  Elastic Bandages & Supports (ADJUSTABLE ARM SLING) MISC L arm sling 05/04/22   Kerri Perches, MD  ezetimibe (ZETIA) 10 MG tablet TAKE 1 TABLET EVERY DAY 01/19/22   Kerri Perches, MD  glucose blood (ACCU-CHEK GUIDE) test strip Use as instructed 12/12/22   Carlus Pavlov, MD  insulin aspart (NOVOLOG) 100 UNIT/ML injection Inject 0-5 Units into the skin at bedtime. 01/26/23   Dahal, Melina Schools, MD  insulin aspart (NOVOLOG) 100 UNIT/ML injection Inject 0-15 Units into the skin 3 (three) times daily with meals. 01/26/23   Lorin Glass, MD  insulin glargine-yfgn (SEMGLEE) 100 UNIT/ML injection Inject  0.1 mLs (10 Units total) into the skin at bedtime. 01/26/23   Dahal, Melina Schools, MD  ipratropium-albuterol (DUONEB) 0.5-2.5 (3) MG/3ML SOLN Take 3 mLs by nebulization every 6 (six) hours as needed. 12/08/22   Waymon Budge, MD  ketoconazole (NIZORAL) 2 % shampoo Apply 1 Application topically 2 (two) times a week. 10/16/22   Kerri Perches, MD  levothyroxine (SYNTHROID) 50 MCG tablet TAKE 1 TABLET DAILY SIX DAYS A WEEK AND 1/2 TABLET ON ONE DAY A WEEK Patient taking differently:  Take 50 mcg by mouth See admin instructions. Take 50 mg by mouth for 6 days a week and 25 mg on one day a week 11/15/22   Carlus Pavlov, MD  melatonin 3 MG TABS tablet Take 1 tablet (3 mg total) by mouth at bedtime as needed. 01/26/23   Lorin Glass, MD  mirabegron ER (MYRBETRIQ) 50 MG TB24 tablet Take 1 tablet (50 mg total) by mouth daily. 12/18/22   Donnita Falls, FNP  Misc. Devices (MATTRESS PAD) MISC FIRM MATTRESS PAD for hospital bed x 1 02/22/21   Kerri Perches, MD  montelukast (SINGULAIR) 10 MG tablet Take 10 mg by mouth daily. 10/27/22   [provider]  Multiple Vitamins-Minerals (MULTIVITAMIN GUMMIES ADULT PO) Take 1 tablet by mouth daily.    [provider]  ondansetron (ZOFRAN) 4 MG tablet Take one tablet by mouth once daily, as needed , for nausea 12/12/22   Kerri Perches, MD  polyethylene glycol (MIRALAX / GLYCOLAX) 17 g packet Take 17 g by mouth daily as needed. 01/26/23   Lorin Glass, MD  pregabalin (LYRICA) 75 MG capsule Take 1 capsule (75 mg total) by mouth daily. 01/26/23 02/25/23  Lorin Glass, MD  RABEprazole (ACIPHEX) 20 MG tablet TAKE 1 TABLET TWICE DAILY 12/28/22   Pyrtle, Carie Caddy, MD  RESTASIS 0.05 % ophthalmic emulsion Place 2 drops into both eyes daily. 07/29/20   [provider]  rosuvastatin (CRESTOR) 5 MG tablet TAKE 1 TABLET AT BEDTIME 06/12/22   Kerri Perches, MD  senna-docusate (SENOKOT-S) 8.6-50 MG tablet Take 1 tablet by mouth 2 (two) times  daily. 01/26/23   Lorin Glass, MD  sertraline (ZOLOFT) 100 MG tablet Take 1 tablet (100 mg total) by mouth every morning. 01/26/23 02/25/23  Lorin Glass, MD  UNABLE TO FIND Med Name:  G5 Mattress 07/31/22   Kerri Perches, MD  vitamin E 180 MG (400 UNITS) capsule Take 400 Units by mouth daily.    [provider]                                                                                                                                    Past Surgical History Past Surgical History:  Procedure Laterality Date   ABDOMINAL HYSTERECTOMY  1978   BACK SURGERY  July 2012   BACTERIAL OVERGROWTH TEST N/A 05/05/2013   Procedure: BACTERIAL OVERGROWTH TEST;  Surgeon: Corbin Ade, MD;  Location: [facility];  Service: Endoscopy;  Laterality: N/A;  7:30   BIOPSY THYROID  2009   BRAIN SURGERY  11/2011   resection of meningioma   BREAST REDUCTION SURGERY  1994   CARDIAC CATHETERIZATION  05/10/2005   normal coronaries, normal LV systolic function and EF (Dr. Evlyn Courier)   CARPAL TUNNEL RELEASE Left 07/22/04   Dr. Romeo Apple   CATARACT EXTRACTION Bilateral    CHOLECYSTECTOMY  1984   COLONOSCOPY N/A 09/25/2012  ION:[id number] diverticulosis.  colonic polyp-removed : tubular adenoma   CRANIOTOMY  11/23/2011   Procedure: CRANIOTOMY TUMOR EXCISION;  Surgeon: Hewitt Shorts, MD;  Location: [facility];  Service: Neurosurgery;  Laterality: N/A;  Craniotomy for tumor resection   ESOPHAGOGASTRODUODENOSCOPY  12/29/2010   Rourk-Retained food in the esophagus and stomach, small hiatal hernia, status post Maloney dilation of the esophagus   ESOPHAGOGASTRODUODENOSCOPY N/A 09/25/2012   [id number] atonic baggy esophagus status post Maloney dilation 56 F. Hiatal hernia   GIVENS CAPSULE STUDY N/A 01/15/2013   NORMAL.    IR GENERIC HISTORICAL  03/17/2016   IR RADIOLOGIST EVAL & MGMT 03/17/2016 [facility]-INTER[facility]   LESION REMOVAL N/A 05/31/2015   Procedure: REMOVAL RIGHT AND LEFT LESIONS OF MANDIBLE;  Surgeon:  Ocie Doyne, DDS;  Location: [facility] OR;  Service: Oral Surgery;  Laterality: N/A;   MALONEY DILATION  12/29/2010   RMR;   NM MYOCAR PERF WALL MOTION  2006   "relavtiely normal" persantine, mild anterior thinning (breast attenuation artifact), no region of scar/ischemia   OVARIAN CYST REMOVAL     RECTOCELE REPAIR N/A 06/29/2015   Procedure: POSTERIOR REPAIR (RECTOCELE);  Surgeon: Tilda Burrow, MD;  Location: [facility] ORS;  Service: Gynecology;  Laterality: N/A;   REDUCTION MAMMAPLASTY Bilateral    REVERSE SHOULDER ARTHROPLASTY Left 08/18/2022   Procedure: REVERSE SHOULDER ARTHROPLASTY;  Surgeon: Beverely Low, MD;  Location: [facility] OR[facility];  Service: Orthopedics;  Laterality: Left;  choice with interscalene, general   SPINE SURGERY  09/29/2010   Dr. Shon Baton   surgical excision of 3 tumors from right thigh and right buttock  and left upper thigh  2010   TOOTH EXTRACTION Bilateral 12/14/2014   Procedure: REMOVAL OF BILATERAL MANDIBULAR EXOSTOSES;  Surgeon: Ocie Doyne, DDS;  Location: [facility] OR;  Service: Oral Surgery;  Laterality: Bilateral;   TRANSTHORACIC ECHOCARDIOGRAM  2010   EF 60-65%, mild conc LVH, grade 1 diastolic dysfunction; mildly calcified MV annulus with mildly thickened leaflets, mildly calcified MR annulus   Family History Family History  Problem Relation Age of Onset   Heart attack Mother        HTN   Pneumonia Father    Kidney failure Father    Diabetes Father    Pancreatic cancer Sister    Cancer Sister        breast    Cancer Sister        pancreatic   Diabetes Brother    Hypertension Brother    Diabetes Brother    Alcohol abuse Maternal Uncle    Stroke Maternal Grandmother    Heart attack Maternal Grandfather    Hypertension Son    Sleep apnea Son    Colon cancer Neg Hx    Anesthesia problems Neg Hx    Hypotension Neg Hx    Malignant hyperthermia Neg Hx    Pseudochol deficiency Neg Hx    Breast cancer Neg Hx    Stomach cancer Neg Hx     Social History Social History    Tobacco Use   Smoking status: Some Days    Current packs/day: 0.00    Average packs/day: 0.5 packs/day for 47.0 years (23.5 ttl pk-yrs)    Types: Cigarettes    Start date: 07/06/1974    Last attempt to quit: 07/05/2021    Years since quitting: 1.5    Passive exposure: Past   Smokeless tobacco: Never   Tobacco comments:    3-4 a day   Vaping Use   Vaping status: Never  Used  Substance Use Topics   Alcohol use: No    Alcohol/week: 0.0 standard drinks of alcohol    Comment:     Drug use: No   Allergies Iron, Milk (cow), Penicillins, Phenazopyridine, Cephalexin, Flonase [fluticasone], and Milk-related compounds  Review of Systems Review of Systems  Neurological:  Positive for headaches.    Physical Exam Vital Signs  I have reviewed the triage vital signs BP (!) 157/63   Pulse 79   Temp 98.3 F (36.8 C) (Oral)   Resp (!) 24   SpO2 98%   Physical Exam Vitals and nursing note reviewed.  Constitutional:      General: She is not in acute distress.    Appearance: She is well-developed.  HENT:     Head: Normocephalic.     Comments: Left-sided facial tenderness Eyes:     Conjunctiva/sclera: Conjunctivae normal.  Cardiovascular:     Rate and Rhythm: Normal rate and regular rhythm.     Heart sounds: No murmur heard. Pulmonary:     Effort: Pulmonary effort is normal. No respiratory distress.     Breath sounds: Normal breath sounds.  Abdominal:     Palpations: Abdomen is soft.     Tenderness: There is no abdominal tenderness.  Musculoskeletal:        General: No swelling.     Cervical back: Neck supple.  Skin:    General: Skin is warm and dry.     Capillary Refill: Capillary refill takes less than 2 seconds.  Neurological:     Mental Status: She is alert.  Psychiatric:        Mood and Affect: Mood normal.     ED Results and Treatments Labs (all labs ordered are listed, but only abnormal results are displayed) Labs Reviewed  COMPREHENSIVE METABOLIC PANEL -  Abnormal; Notable for the following components:      Result Value   Glucose, Bld 139 (*)    Creatinine, Ser 1.16 (*)    GFR, Estimated 50 (*)    All other components within normal limits  CBC WITH DIFFERENTIAL/PLATELET - Abnormal; Notable for the following components:   Hemoglobin 9.6 (*)    HCT 33.8 (*)    MCV 77.3 (*)    MCH 22.0 (*)    MCHC 28.4 (*)    RDW 15.7 (*)    All other components within normal limits                                                                                                                          Radiology DG Pelvis Portable  Result Date: 02/03/2023 CLINICAL DATA:  Fall EXAM: PORTABLE PELVIS 1-2 VIEWS COMPARISON:  June 27, 2017. FINDINGS: No evidence of pelvic fracture or diastasis on single AP view radiograph. Sacrum is obscured by overlapping bowel contents. Soft tissues are unremarkable. Degenerative changes of the lower lumbar spine. IMPRESSION: No evidence of pelvic fracture or diastasis on single AP view radiograph. If persistent clinical concern,  recommend dedicated cross-sectional imaging. Electronically Signed   By: Meda Klinefelter M.D.   On: 02/03/2023 16:26   DG Chest Portable 1 View  Result Date: 02/03/2023 CLINICAL DATA:  fall, EXAM: PORTABLE CHEST 1 VIEW COMPARISON:  January 15, 2023 FINDINGS: The cardiomediastinal silhouette is unchanged and enlarged in contour. No pleural effusion. No pneumothorax. No acute pleuroparenchymal abnormality. Status post LEFT shoulder arthroplasty. Remote anterior RIGHT rib fractures. IMPRESSION: No acute cardiopulmonary abnormality. Electronically Signed   By: Meda Klinefelter M.D.   On: 02/03/2023 16:25   CT HEAD WO CONTRAST ( )  Result Date: 02/03/2023 CLINICAL DATA:  Fall from wheelchair, trauma EXAM: CT HEAD WITHOUT CONTRAST CT MAXILLOFACIAL WITHOUT CONTRAST CT CERVICAL SPINE WITHOUT CONTRAST TECHNIQUE: Multidetector CT imaging of the head, cervical spine, and maxillofacial structures were  performed using the standard protocol without intravenous contrast. Multiplanar CT image reconstructions of the cervical spine and maxillofacial structures were also generated. RADIATION DOSE REDUCTION: This exam was performed according to the departmental dose-optimization program which includes automated exposure control, adjustment of the mA and/or kV according to patient size and/or use of iterative reconstruction technique. COMPARISON:  01/12/2023 FINDINGS: CT HEAD FINDINGS Brain: No evidence of acute infarction, hemorrhage, hydrocephalus, extra-axial collection or mass lesion/mass effect. Unchanged encephalomalacia of the frontal poles (series 3, image 16). Vascular: No hyperdense vessel or unexpected calcification. CT FACIAL BONES FINDINGS Skull: Status post frontal craniotomy. Hyperostosis frontalis. Negative for fracture or focal lesion. Facial bones: No displaced fractures or dislocations. Sinuses/Orbits: No acute finding. Other: Patient is edentulous. CT CERVICAL SPINE FINDINGS Alignment: Normal. Skull base and vertebrae: No acute fracture. No primary bone lesion or focal pathologic process. Soft tissues and spinal canal: No prevertebral fluid or swelling. No visible canal hematoma. Disc levels: Moderate multilevel cervical disc degenerative disease, worst from C5-C7. Upper chest: Negative. Other: None. IMPRESSION: 1. No acute intracranial pathology. Unchanged encephalomalacia of the frontal poles. Status post frontal craniotomy. 2. No displaced fractures or dislocations of the facial bones. 3. No fracture or static subluxation of the cervical spine. 4. Moderate multilevel cervical disc degenerative disease, worst from C5-C7. Electronically Signed   By: Jearld Lesch M.D.   On: 02/03/2023 16:16   CT CERVICAL SPINE WO CONTRAST  Result Date: 02/03/2023 CLINICAL DATA:  Fall from wheelchair, trauma EXAM: CT HEAD WITHOUT CONTRAST CT MAXILLOFACIAL WITHOUT CONTRAST CT CERVICAL SPINE WITHOUT CONTRAST  TECHNIQUE: Multidetector CT imaging of the head, cervical spine, and maxillofacial structures were performed using the standard protocol without intravenous contrast. Multiplanar CT image reconstructions of the cervical spine and maxillofacial structures were also generated. RADIATION DOSE REDUCTION: This exam was performed according to the departmental dose-optimization program which includes automated exposure control, adjustment of the mA and/or kV according to patient size and/or use of iterative reconstruction technique. COMPARISON:  01/12/2023 FINDINGS: CT HEAD FINDINGS Brain: No evidence of acute infarction, hemorrhage, hydrocephalus, extra-axial collection or mass lesion/mass effect. Unchanged encephalomalacia of the frontal poles (series 3, image 16). Vascular: No hyperdense vessel or unexpected calcification. CT FACIAL BONES FINDINGS Skull: Status post frontal craniotomy. Hyperostosis frontalis. Negative for fracture or focal lesion. Facial bones: No displaced fractures or dislocations. Sinuses/Orbits: No acute finding. Other: Patient is edentulous. CT CERVICAL SPINE FINDINGS Alignment: Normal. Skull base and vertebrae: No acute fracture. No primary bone lesion or focal pathologic process. Soft tissues and spinal canal: No prevertebral fluid or swelling. No visible canal hematoma. Disc levels: Moderate multilevel cervical disc degenerative disease, worst from C5-C7. Upper chest: Negative. Other: None. IMPRESSION: 1. No  acute intracranial pathology. Unchanged encephalomalacia of the frontal poles. Status post frontal craniotomy. 2. No displaced fractures or dislocations of the facial bones. 3. No fracture or static subluxation of the cervical spine. 4. Moderate multilevel cervical disc degenerative disease, worst from C5-C7. Electronically Signed   By: Jearld Lesch M.D.   On: 02/03/2023 16:16   CT Maxillofacial Wo Contrast  Result Date: 02/03/2023 CLINICAL DATA:  Fall from wheelchair, trauma EXAM: CT  HEAD WITHOUT CONTRAST CT MAXILLOFACIAL WITHOUT CONTRAST CT CERVICAL SPINE WITHOUT CONTRAST TECHNIQUE: Multidetector CT imaging of the head, cervical spine, and maxillofacial structures were performed using the standard protocol without intravenous contrast. Multiplanar CT image reconstructions of the cervical spine and maxillofacial structures were also generated. RADIATION DOSE REDUCTION: This exam was performed according to the departmental dose-optimization program which includes automated exposure control, adjustment of the mA and/or kV according to patient size and/or use of iterative reconstruction technique. COMPARISON:  01/12/2023 FINDINGS: CT HEAD FINDINGS Brain: No evidence of acute infarction, hemorrhage, hydrocephalus, extra-axial collection or mass lesion/mass effect. Unchanged encephalomalacia of the frontal poles (series 3, image 16). Vascular: No hyperdense vessel or unexpected calcification. CT FACIAL BONES FINDINGS Skull: Status post frontal craniotomy. Hyperostosis frontalis. Negative for fracture or focal lesion. Facial bones: No displaced fractures or dislocations. Sinuses/Orbits: No acute finding. Other: Patient is edentulous. CT CERVICAL SPINE FINDINGS Alignment: Normal. Skull base and vertebrae: No acute fracture. No primary bone lesion or focal pathologic process. Soft tissues and spinal canal: No prevertebral fluid or swelling. No visible canal hematoma. Disc levels: Moderate multilevel cervical disc degenerative disease, worst from C5-C7. Upper chest: Negative. Other: None. IMPRESSION: 1. No acute intracranial pathology. Unchanged encephalomalacia of the frontal poles. Status post frontal craniotomy. 2. No displaced fractures or dislocations of the facial bones. 3. No fracture or static subluxation of the cervical spine. 4. Moderate multilevel cervical disc degenerative disease, worst from C5-C7. Electronically Signed   By: Jearld Lesch M.D.   On: 02/03/2023 16:16    Pertinent labs &  imaging results that were available during my care of the patient were reviewed by me and considered in my medical decision making (see MDM for details).  Medications Ordered in ED Medications  HYDROcodone-acetaminophen (NORCO/VICODIN) 5-325 MG per tablet 1 tablet (1 tablet Oral Given 02/03/23 1727)                                                                                                                                     Procedures Procedures  (including critical care time)  Medical Decision Making / ED Course   This patient presents to the ED for concern of fall, this involves an extensive number of treatment options, and is a complaint that carries with it a high risk of complications and morbidity.  The differential diagnosis includes fracture, contusion, hematoma, ligamentous injury, closed head injury, ICH, laceration, intrathoracic injury, intra-abdominal injury  MDM: Patient seen in the Emergency  Department for evaluation of a fall.  Physical exam with some mild tenderness over the left side of the face over the zygoma but is otherwise unremarkable and unchanged from baseline.  Laboratory evaluation with a hemoglobin of 9.6 which is unchanged from patient's baseline, creatinine 1.16.  Trauma imaging including CT head, C-spine, max face, chest x-ray, pelvis x-ray reassuringly negative for acute traumatic injury. ECG nonischemic.  At this time, patient does not meet inpatient criteria for admission with overall reassuring workup and she will be discharged back to her facility.     Additional history obtained:  -External records from outside source obtained and reviewed including: Chart review including previous notes, labs, imaging, consultation notes   Lab Tests: -I ordered, reviewed, and interpreted labs.   The pertinent results include:   Labs Reviewed  COMPREHENSIVE METABOLIC PANEL - Abnormal; Notable for the following components:      Result Value   Glucose, Bld 139  (*)    Creatinine, Ser 1.16 (*)    GFR, Estimated 50 (*)    All other components within normal limits  CBC WITH DIFFERENTIAL/PLATELET - Abnormal; Notable for the following components:   Hemoglobin 9.6 (*)    HCT 33.8 (*)    MCV 77.3 (*)    MCH 22.0 (*)    MCHC 28.4 (*)    RDW 15.7 (*)    All other components within normal limits      EKG   EKG Interpretation Date/Time:  Saturday February 03 2023 15:11:27 EST Ventricular Rate:  72 PR Interval:  206 QRS Duration:  92 QT Interval:  388 QTC Calculation: 425 R Axis:   47  Text Interpretation: Sinus rhythm Abnormal R-wave progression, early transition Confirmed by Emmah Bratcher ([id number]) on 02/03/2023 3:31:37 PM         Imaging Studies ordered: I ordered imaging studies including chest x-ray, pelvis x-ray, CT head, C-spine, max face I independently visualized and interpreted imaging. I agree with the radiologist interpretation   Medicines ordered and prescription drug management: Meds ordered this encounter  Medications   HYDROcodone-acetaminophen (NORCO/VICODIN) 5-325 MG per tablet 1 tablet    -I have reviewed the patients home medicines and have made adjustments as needed  Critical interventions none   Cardiac Monitoring: The patient was maintained on a cardiac monitor.  I personally viewed and interpreted the cardiac monitored which showed an underlying rhythm of: NSR  Social Determinants of Health:  Factors impacting patients care include: Lives in a skilled nursing facility   Reevaluation: After the interventions noted above, I reevaluated the patient and found that they have :improved  Co morbidities that complicate the patient evaluation  Past Medical History:  Diagnosis Date   Allergy    Anemia    Anemia in chronic kidney disease (CKD) 08/10/2021   Anemia in chronic kidney disease (CKD) 10/12/2021   Anxiety    takes Ativan daily   Arthritis    Assistance needed for mobility    Bipolar disorder  (HCC)    takes Risperdal nightly   Blood transfusion    Brain tumor (HCC)    Cancer (HCC)    In her gum   Carpal tunnel syndrome of right wrist 05/23/2011   Cervical disc disorder with radiculopathy of cervical region 10/31/2012   Chronic back pain    Chronic idiopathic constipation    Chronic neck and back pain    Colon polyps    COPD (chronic obstructive pulmonary disease) with chronic bronchitis (HCC) 09/16/2013  Office Spirometry 10/30/2013-submaximal effort based on appearance of loop and curve. Numbers would fit with severe restriction but her physiologic capability may be better than this. FVC 0.91/44%, and 10.74/45%, FEV1/FVC 0.81, FEF 25-75% 1.43/69%     Diabetes mellitus    Type II   Diverticulosis    TCS 9/08 by Dr. Lina Sar for diarrhea . Bx for micro scopic colitis negative.    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   Metabolic encephalopathy 08/03/2011   Migraines    chronic headaches   Mononeuritis lower limb    Narcolepsy    Osteoporosis    Pancreatitis 2006   due to Depakote with normal EUS    Paralysis (HCC)    Pneumonia    Schatzki's ring    non critical / EGD with ED 8/2011with RMR   Seizures (HCC)    takes Lamictal daily.Last seizure 3 yrs ago   Sleep apnea    on CPAP   Small bowel obstruction (HCC)    Stroke (HCC)    left sided weakness, speech changes   Tubular adenoma of colon       Dispostion: I considered admission for this patient, but at this time she  does not meet inpatient criteria for admission and will be discharged with outpatient follow-up     Final Clinical Impression(s) / ED Diagnoses Final diagnoses:  Fall, initial encounter  Closed head injury, initial encounter     @PCDICTATION @    Glendora Score, MD 02/03/23 2013

## 2023-02-03 NOTE — ED Triage Notes (Signed)
Patient present to ED via [facility],  Alert and oriented x 3 staff reports patient was sitting in the wheelchair and fell in the floor. Patient doesn't remember hitting her head. Patient has a history CVA, DM, CVA. CBG taken with EMS 175

## 2023-02-05 DIAGNOSIS — F4323 Adjustment disorder with mixed anxiety and depressed mood: Secondary | ICD-10-CM | POA: Diagnosis not present

## 2023-02-05 DIAGNOSIS — G4700 Insomnia, unspecified: Secondary | ICD-10-CM | POA: Diagnosis not present

## 2023-02-05 NOTE — Progress Notes (Unsigned)
VIRTUAL VISIT via TELEPHONE NOTE [facility]   I connected with [name]  on 02/06/23  at 1:30 PM by telephone and verified that I am speaking with the correct person using two identifiers.  Location: Patient: Home Provider: [facility]   I discussed the limitations, risks, security and privacy concerns of performing an evaluation and management service by telephone and the availability of in person appointments. I also discussed with the patient that there may be a patient responsible charge related to this service. The patient expressed understanding and agreed to proceed.  REASON FOR VISIT:  Follow-up for anemia of CKD and iron deficiency   CURRENT THERAPY: Epogen & IV iron  INTERVAL HISTORY:  [name] is contacted today for follow-up of anemia related to CKD and iron deficiency.  She was last seen by Rojelio Brenner PA-C on 11/14/2022.  In the interim since last visit, she was hospitalized from 01/12/2023 through 01/27/2023: Acute metabolic encephalopathy secondary to dehydration/AKI, polypharmacy, and seizures.  She was discharged to SNF for rehab.   At today's visit, she reports feeling poorly.  She remains on Procrit injections every 4 weeks, but missed injection in November 2024 due to hospitalization.  She continues to have unchanged chronic fatigue.  She reports occasional dark stool, with last episode several months ago.   She denies any bright red blood per rectum or hematochezia. She has chronic pica cravings for ice chips as well as occasional headaches. She denies any restless legs, chest pain, or syncope. She has chronic dyspnea on exertion and lightheadedness.   She denies any fever, chills, or unintentional weight loss.   She has 50% energy and 75% appetite. She endorses that she is maintaining a stable weight.  REVIEW OF SYSTEMS:   Review of Systems  Constitutional:  Positive for malaise/fatigue. Negative for chills, diaphoresis,  fever and weight loss.  Respiratory:  Positive for shortness of breath. Negative for cough.   Cardiovascular:  Negative for chest pain and palpitations.  Gastrointestinal:  Negative for abdominal pain, blood in stool, melena, nausea and vomiting.  Musculoskeletal:  Positive for joint pain.  Neurological:  Positive for dizziness and headaches.     PHYSICAL EXAM: (per limitations of virtual telephone visit)  The patient is alert and oriented x 3, exhibiting adequate mentation, good mood, and ability to speak in full sentences and execute sound judgement.  ASSESSMENT & PLAN:  1.  Microcytic anemia - This is a combination anemia from functional iron deficiency (in the setting of chronic disease including COPD, type 2 diabetes, fibromyalgia, hypertension, and multiple other comorbidities listed in history) and chronic kidney disease stage III. - She has chronic microcytosis (suspect underlying sickle cell trait or thalassemia trait) - SPEP checked in 2019 was normal. - Hemoccult stool positive in 2014, negative in 2015 - Colonoscopy (11/04/2020): Multiple diverticula, otherwise normal - EGD (11/04/2020): Normal esophagus, gastritis, gastric lipoma - She was previously placed on iron pills, but could not tolerate them due to constipation. - Most recent IV iron Venofer 500 mg x 1 on 06/23/2022 (requires PREMEDICATION with steroids and Zofran due to episode of mild swelling, itching, and nausea after Feraheme in October 2020) - She has been on Epogen since 11/02/2021.  Current dose is 10,000 units every 4 weeks.  - She denies any bleeding per rectum or melena, but does have occasional dark stools  - Symptomatic with significant fatigue which is chronic and unchanged - Labs today (02/06/2023):  Hgb 10.1/MCV 77.8, ferritin 112, iron saturation 17%.  (Normal folate, copper, vitamin B12 when checked in April 2023.) - Hemoccult stool x3 ordered in August 2023, but not returned by patient.  Patient declines to  complete this test because it "grosses her out." - PLAN: CBC + Epogen 10,000 units every 4 weeks (decreased frequency)  - Repeat CBC, CMP, ferritin, and iron/TIBC with same-day office visit in 3 months  - She follows with Dr. [name] for her CKD stage IIIa   2.  Meningioma - 11/23/11: Craniotomy, resection orbital groove meningioma [name]) - 10/17/18: [facility] for localized recurrent disease [name]) - PLAN: Follows with Dr. [name] (neuro-oncology) annually   PLAN SUMMARY:  >> CBC + Epogen every 4 weeks  >> Labs in 3 months (CBC/D, CMP, ferritin, iron/TIBC) >> Office visit in 3 months (same day as labs/injection)     I discussed the assessment and treatment plan with the patient. The patient was provided an opportunity to ask questions and all were answered. The patient agreed with the plan and demonstrated an understanding of the instructions.   The patient was advised to call back or seek an in-person evaluation if the symptoms worsen or if the condition fails to improve as anticipated.  I provided 22 minutes of non-face-to-face time during this encounter.  Carnella Guadalajara, PA-C 02/06/23 6:32 PM

## 2023-02-06 ENCOUNTER — Inpatient Hospital Stay: Payer: Medicare HMO | Attending: Hematology | Admitting: Physician Assistant

## 2023-02-06 ENCOUNTER — Inpatient Hospital Stay: Payer: Medicare HMO

## 2023-02-06 ENCOUNTER — Encounter: Payer: Self-pay | Admitting: Physician Assistant

## 2023-02-06 VITALS — BP 127/62 | HR 65

## 2023-02-06 DIAGNOSIS — D631 Anemia in chronic kidney disease: Secondary | ICD-10-CM | POA: Insufficient documentation

## 2023-02-06 DIAGNOSIS — D509 Iron deficiency anemia, unspecified: Secondary | ICD-10-CM

## 2023-02-06 DIAGNOSIS — N1831 Chronic kidney disease, stage 3a: Secondary | ICD-10-CM | POA: Diagnosis present

## 2023-02-06 DIAGNOSIS — D329 Benign neoplasm of meninges, unspecified: Secondary | ICD-10-CM | POA: Diagnosis not present

## 2023-02-06 DIAGNOSIS — N1832 Chronic kidney disease, stage 3b: Secondary | ICD-10-CM

## 2023-02-06 DIAGNOSIS — D638 Anemia in other chronic diseases classified elsewhere: Secondary | ICD-10-CM

## 2023-02-06 DIAGNOSIS — I129 Hypertensive chronic kidney disease with stage 1 through stage 4 chronic kidney disease, or unspecified chronic kidney disease: Secondary | ICD-10-CM | POA: Diagnosis not present

## 2023-02-06 LAB — CBC WITH DIFFERENTIAL/PLATELET
Abs Immature Granulocytes: 0.04 10*3/uL (ref 0.00–0.07)
Basophils Absolute: 0 10*3/uL (ref 0.0–0.1)
Basophils Relative: 0 %
Eosinophils Absolute: 0.2 10*3/uL (ref 0.0–0.5)
Eosinophils Relative: 3 %
HCT: 34.7 % — ABNORMAL LOW (ref 36.0–46.0)
Hemoglobin: 10.1 g/dL — ABNORMAL LOW (ref 12.0–15.0)
Immature Granulocytes: 1 %
Lymphocytes Relative: 27 %
Lymphs Abs: 1.5 10*3/uL (ref 0.7–4.0)
MCH: 22.6 pg — ABNORMAL LOW (ref 26.0–34.0)
MCHC: 29.1 g/dL — ABNORMAL LOW (ref 30.0–36.0)
MCV: 77.8 fL — ABNORMAL LOW (ref 80.0–100.0)
Monocytes Absolute: 0.5 10*3/uL (ref 0.1–1.0)
Monocytes Relative: 8 %
Neutro Abs: 3.4 10*3/uL (ref 1.7–7.7)
Neutrophils Relative %: 61 %
Platelets: 270 10*3/uL (ref 150–400)
RBC: 4.46 MIL/uL (ref 3.87–5.11)
RDW: 15.9 % — ABNORMAL HIGH (ref 11.5–15.5)
WBC: 5.6 10*3/uL (ref 4.0–10.5)
nRBC: 0 % (ref 0.0–0.2)

## 2023-02-06 LAB — IRON AND TIBC
Iron: 57 ug/dL (ref 28–170)
Saturation Ratios: 17 % (ref 10.4–31.8)
TIBC: 337 ug/dL (ref 250–450)
UIBC: 280 ug/dL

## 2023-02-06 LAB — COMPREHENSIVE METABOLIC PANEL
ALT: 19 U/L (ref 0–44)
AST: 18 U/L (ref 15–41)
Albumin: 3.9 g/dL (ref 3.5–5.0)
Alkaline Phosphatase: 71 U/L (ref 38–126)
Anion gap: 7 (ref 5–15)
BUN: 16 mg/dL (ref 8–23)
CO2: 24 mmol/L (ref 22–32)
Calcium: 8.9 mg/dL (ref 8.9–10.3)
Chloride: 108 mmol/L (ref 98–111)
Creatinine, Ser: 0.88 mg/dL (ref 0.44–1.00)
GFR, Estimated: >60 mL/min (ref >60)
Glucose, Bld: 172 mg/dL — ABNORMAL HIGH (ref 70–99)
Potassium: 3.6 mmol/L (ref 3.5–5.1)
Sodium: 139 mmol/L (ref 135–145)
Total Bilirubin: 0.3 mg/dL (ref <1.2)
Total Protein: 7.2 g/dL (ref 6.5–8.1)

## 2023-02-06 LAB — FERRITIN: Ferritin: 112 ng/mL (ref 11–307)

## 2023-02-06 MED ORDER — EPOETIN ALFA 10000 UNIT/ML IJ SOLN
10000.0000 [IU] | Freq: Once | INTRAMUSCULAR | Status: AC
Start: 2023-02-06 — End: 2023-02-06
  Administered 2023-02-06: 10000 [IU] via SUBCUTANEOUS
  Filled 2023-02-06: qty 1

## 2023-02-06 NOTE — Progress Notes (Signed)
Patients Hgb 10.1 and Blood pressure stable. Patient tolerated Retacrit injection with no complaints voiced.  Site clean and dry with no bruising or swelling noted at site.  See MAR for details.  Band aid applied.  Patient stable during and after injection.  Vss with discharge and left in satisfactory condition with no s/s of distress noted. All follow ups as scheduled.   Caron Tardif [employer]

## 2023-02-07 DIAGNOSIS — R451 Restlessness and agitation: Secondary | ICD-10-CM | POA: Diagnosis not present

## 2023-02-07 IMAGING — MR MR LUMBAR SPINE W/O CM
4 of 5 series · 32 of 48 positions shown · non-contrast
Comparison: 10/29/2017

CLINICAL DATA: Low back pain, symptoms persist with > 6 wks
treatment Myelopathy, acute, lumbar spine

EXAM:
MRI LUMBAR SPINE WITHOUT CONTRAST
TECHNIQUE: Multiplanar, multisequence MR imaging of the lumbar spine was
performed. No intravenous contrast was administered.

[Series 5: T2 · sagittal · 4.0mm · 0.68mm/px · 8 of 15 slices shown (1 of 2)]
[im 1/15]
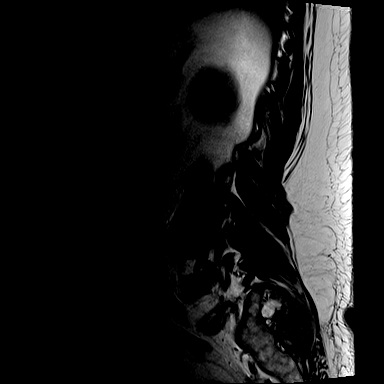
[im 3/15]
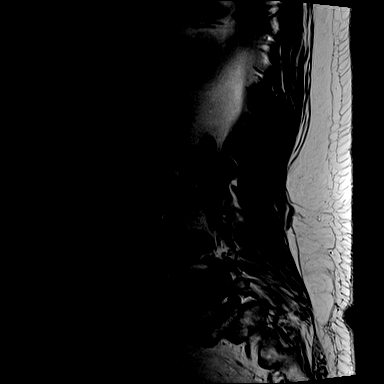
[im 5/15]
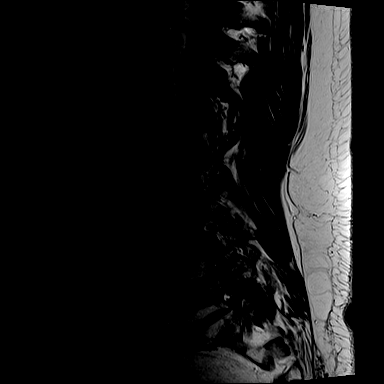
[im 7/15]
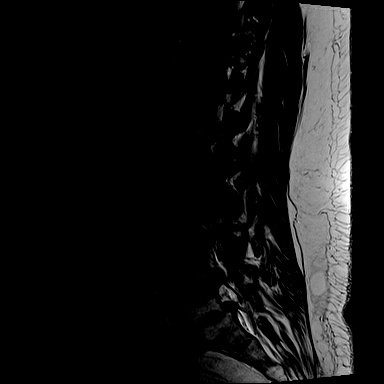
[im 9/15]
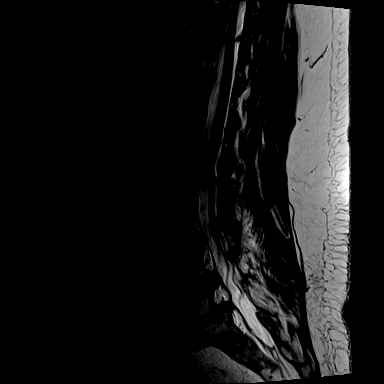
[im 11/15]
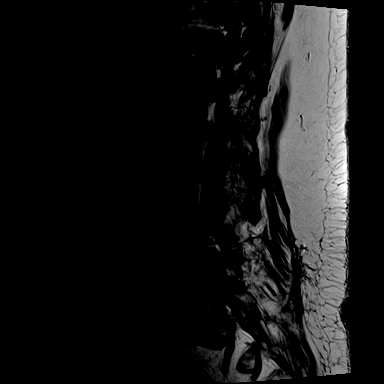
[im 13/15]
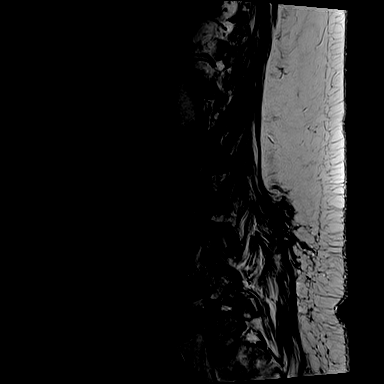
[im 15/15]
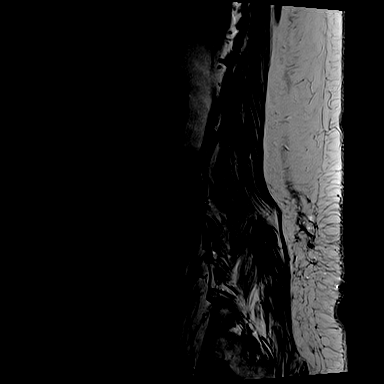

[Series 6: T1 · sagittal · 4.0mm · 0.81mm/px · 7 of 15 slices shown (1 of 2)]
[im 1/15]
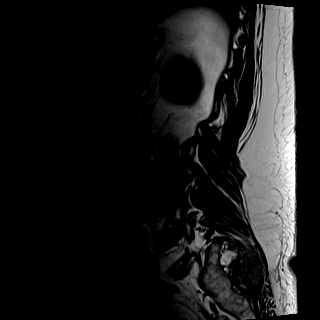
[im 3/15]
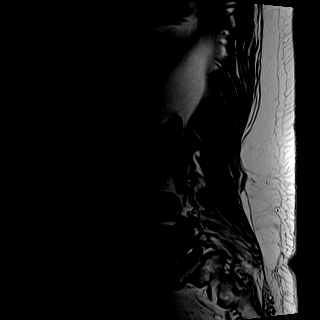
[im 5/15]
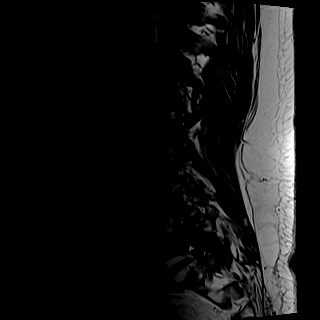
[im 8/15]
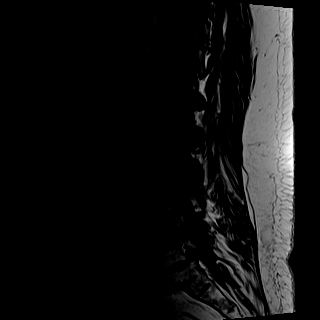
[im 10/15]
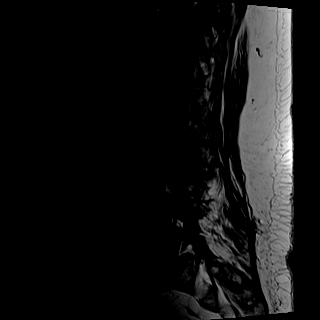
[im 12/15]
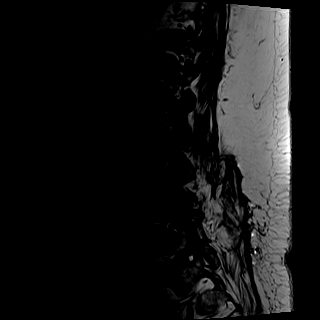
[im 15/15]
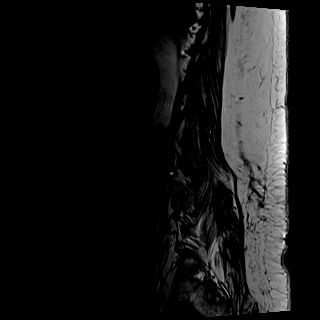

[Series 8: T2 · axial · 4.0mm · 0.70mm/px · z∈[-142,+10]mm · 9 of 28 slices shown (2 of 2)]
[im 1/28]
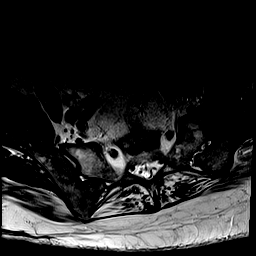
[im 5/28]
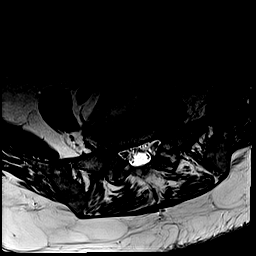
[im 10/28]
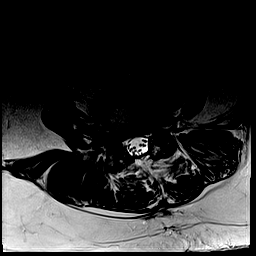
[im 12/28]
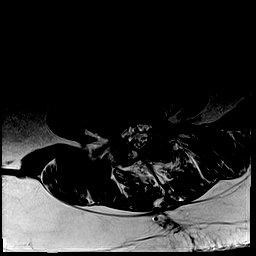
[im 14/28]
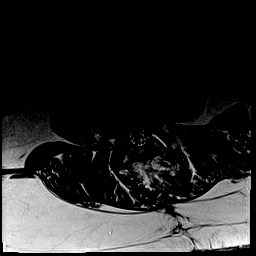
[im 16/28]
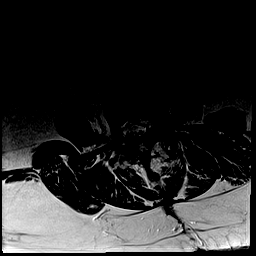
[im 19/28]
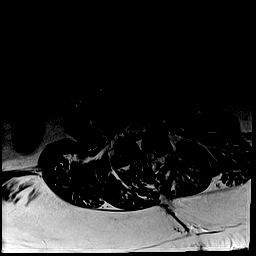
[im 23/28]
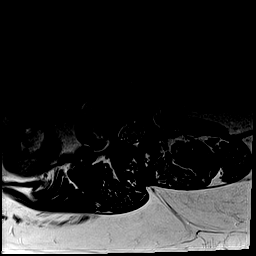
[im 28/28]
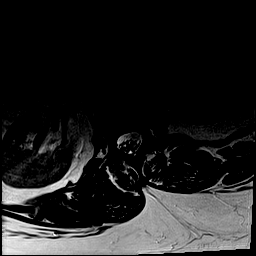

[Series 9: T1 · axial · 4.0mm · 0.35mm/px · z∈[-142,-15]mm · 8 of 28 slices shown (2 of 2)]
[im 1/28]
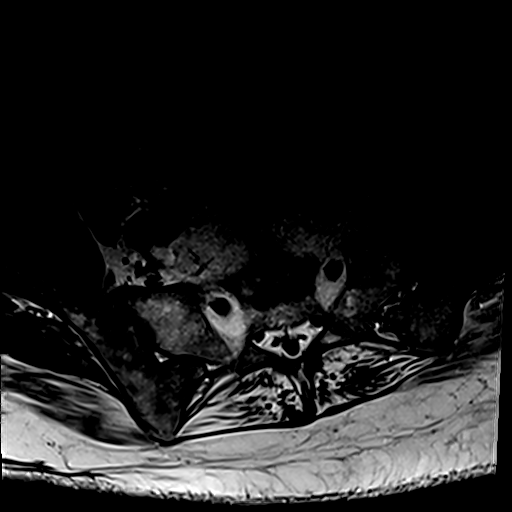
[im 5/28]
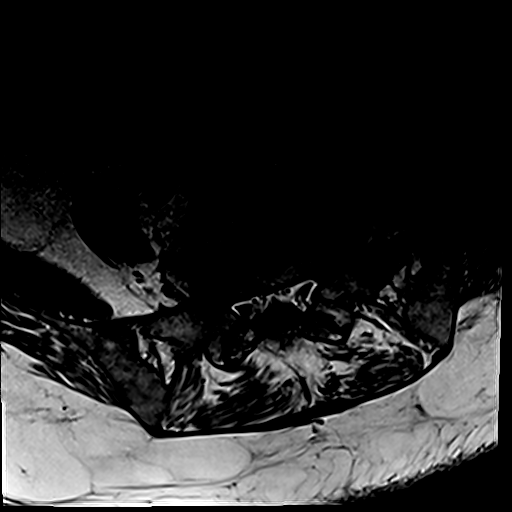
[im 10/28]
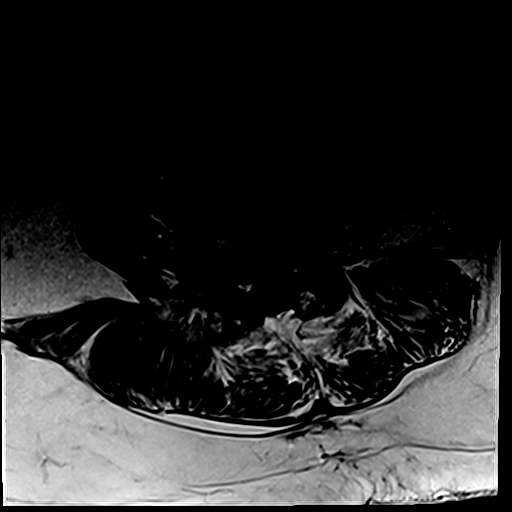
[im 12/28]
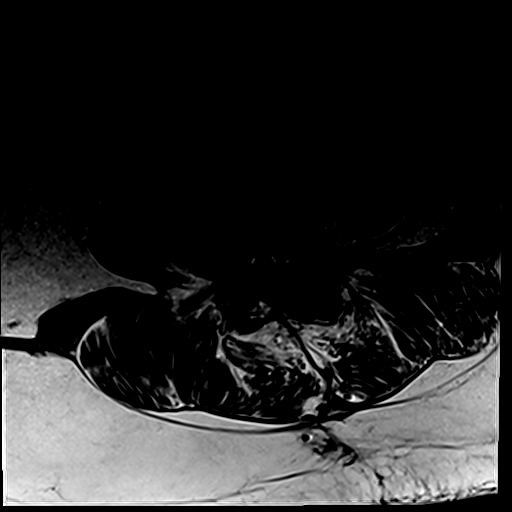
[im 14/28]
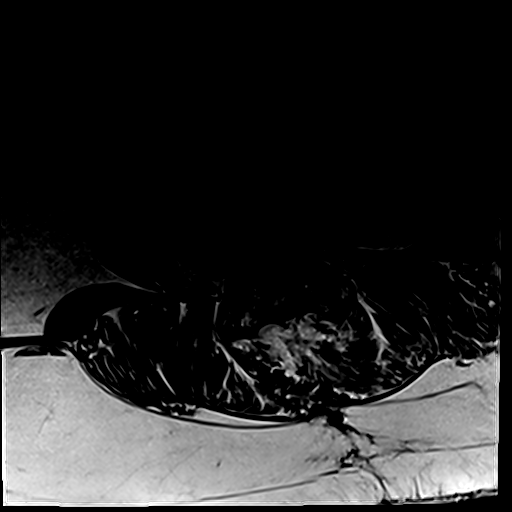
[im 16/28]
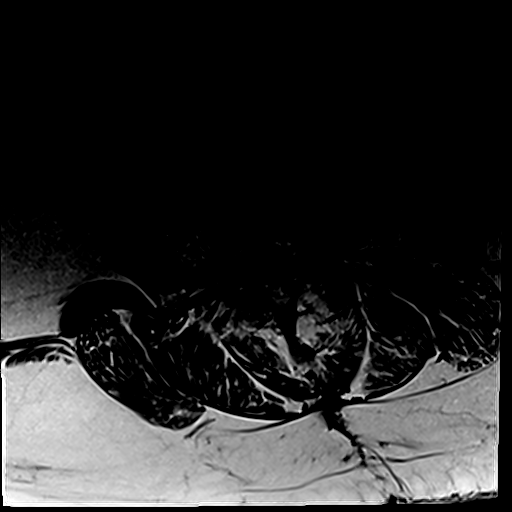
[im 19/28]
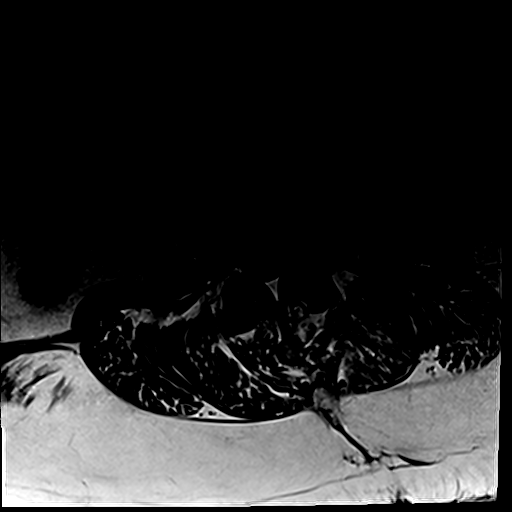
[im 23/28]
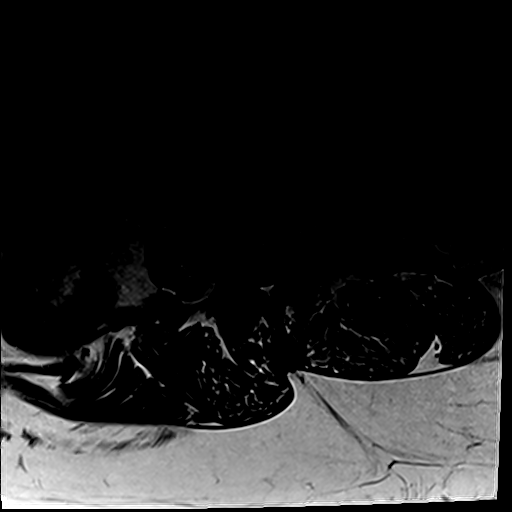

[32 of 48 positions shown; findings below may reference images not displayed]

FINDINGS: Segmentation:  Standard.

Alignment: 4 mm grade 1 anterolisthesis of L5 on S1. Trace
anterolisthesis at L3-4.

Vertebrae: No acute fracture. Discogenic endplate marrow changes
most pronounced at L4-5 and L5-S1. No evidence of discitis. Numerous
scattered T1/T2 hyperintense intraosseous hemangiomas. No suspicious
bone lesion.

Conus medullaris and cauda equina: Conus extends to the L1 level.
Conus and cauda equina appear normal.

Paraspinal and other soft tissues: No acute findings.

Disc levels:

T12-L1: Unremarkable.

L1-L2: Mild annular disc bulge, slightly progressed. Mild bilateral
facet arthropathy. No foraminal or canal stenosis.

L2-L3: Mild annular disc bulge. Mild bilateral facet arthropathy. No
foraminal or canal stenosis. Unchanged.

L3-L4: Prior posterior decompression. Moderate diffuse disc bulge.
Mild-moderate facet arthropathy, left greater than right. New 6 x 5
x 5 mm facet synovial cyst emanating anteriorly from the left L3-4
facet joint resulting in narrowing of the left subarticular recess
stenosis. No canal stenosis. Moderate bilateral foraminal stenosis.
Findings slightly progressed.

L4-L5: Prior posterior decompression. Moderate diffuse disc bulge.
Bilateral facet arthropathy. Severe right and moderate-severe left
foraminal stenosis. No canal stenosis. Findings slightly progressed.

L5-S1: Prior posterior decompression. Anterolisthesis with disc
bulge and endplate ridging. Severe bilateral foraminal stenosis. No
canal stenosis. No significant interval progression.
IMPRESSION: 1. Slight progression of degenerative changes of the lumbar spine as
described.
2. New 6 mm facet synovial cyst emanating anteriorly from the left
L3-4 facet joint resulting in narrowing of the left subarticular
recess. Moderate bilateral foraminal stenosis at this level is
slightly progressed.
3. Severe right and moderate-severe left foraminal stenosis at
L4-L5, and severe bilateral foraminal stenosis at L5-S1, not
significantly progressed.
4. No significant canal stenosis at any level.

## 2023-02-09 DIAGNOSIS — N189 Chronic kidney disease, unspecified: Secondary | ICD-10-CM | POA: Diagnosis not present

## 2023-02-09 DIAGNOSIS — E119 Type 2 diabetes mellitus without complications: Secondary | ICD-10-CM | POA: Diagnosis not present

## 2023-02-09 DIAGNOSIS — G40909 Epilepsy, unspecified, not intractable, without status epilepticus: Secondary | ICD-10-CM | POA: Diagnosis not present

## 2023-02-09 DIAGNOSIS — F411 Generalized anxiety disorder: Secondary | ICD-10-CM | POA: Diagnosis not present

## 2023-02-09 DIAGNOSIS — I10 Essential (primary) hypertension: Secondary | ICD-10-CM | POA: Diagnosis not present

## 2023-02-09 DIAGNOSIS — M6281 Muscle weakness (generalized): Secondary | ICD-10-CM | POA: Diagnosis not present

## 2023-02-09 DIAGNOSIS — F339 Major depressive disorder, recurrent, unspecified: Secondary | ICD-10-CM | POA: Diagnosis not present

## 2023-02-09 DIAGNOSIS — R451 Restlessness and agitation: Secondary | ICD-10-CM | POA: Diagnosis not present

## 2023-02-10 ENCOUNTER — Other Ambulatory Visit: Payer: Self-pay | Admitting: Internal Medicine

## 2023-02-11 ENCOUNTER — Other Ambulatory Visit: Payer: Self-pay

## 2023-02-11 ENCOUNTER — Emergency Department (HOSPITAL_COMMUNITY)
Admission: EM | Admit: 2023-02-11 | Discharge: 2023-02-11 | Disposition: A | Discharge status: Home / Self Care | Payer: Medicare HMO | Attending: Emergency Medicine | Admitting: Emergency Medicine

## 2023-02-11 ENCOUNTER — Encounter (HOSPITAL_COMMUNITY): Payer: Self-pay | Admitting: Emergency Medicine

## 2023-02-11 ENCOUNTER — Emergency Department (HOSPITAL_COMMUNITY): Payer: Medicare HMO

## 2023-02-11 DIAGNOSIS — M542 Cervicalgia: Secondary | ICD-10-CM | POA: Diagnosis not present

## 2023-02-11 DIAGNOSIS — S199XXA Unspecified injury of neck, initial encounter: Secondary | ICD-10-CM | POA: Diagnosis not present

## 2023-02-11 DIAGNOSIS — R220 Localized swelling, mass and lump, head: Secondary | ICD-10-CM | POA: Diagnosis not present

## 2023-02-11 DIAGNOSIS — I10 Essential (primary) hypertension: Secondary | ICD-10-CM | POA: Insufficient documentation

## 2023-02-11 DIAGNOSIS — Z794 Long term (current) use of insulin: Secondary | ICD-10-CM | POA: Insufficient documentation

## 2023-02-11 DIAGNOSIS — F411 Generalized anxiety disorder: Secondary | ICD-10-CM | POA: Diagnosis not present

## 2023-02-11 DIAGNOSIS — Z8673 Personal history of transient ischemic attack (TIA), and cerebral infarction without residual deficits: Secondary | ICD-10-CM | POA: Insufficient documentation

## 2023-02-11 DIAGNOSIS — J449 Chronic obstructive pulmonary disease, unspecified: Secondary | ICD-10-CM | POA: Diagnosis not present

## 2023-02-11 DIAGNOSIS — I6523 Occlusion and stenosis of bilateral carotid arteries: Secondary | ICD-10-CM | POA: Diagnosis not present

## 2023-02-11 DIAGNOSIS — Z79899 Other long term (current) drug therapy: Secondary | ICD-10-CM | POA: Diagnosis not present

## 2023-02-11 DIAGNOSIS — M6281 Muscle weakness (generalized): Secondary | ICD-10-CM | POA: Diagnosis not present

## 2023-02-11 DIAGNOSIS — F339 Major depressive disorder, recurrent, unspecified: Secondary | ICD-10-CM | POA: Diagnosis not present

## 2023-02-11 DIAGNOSIS — E119 Type 2 diabetes mellitus without complications: Secondary | ICD-10-CM | POA: Diagnosis not present

## 2023-02-11 DIAGNOSIS — G40909 Epilepsy, unspecified, not intractable, without status epilepticus: Secondary | ICD-10-CM | POA: Diagnosis not present

## 2023-02-11 DIAGNOSIS — G9389 Other specified disorders of brain: Secondary | ICD-10-CM | POA: Diagnosis not present

## 2023-02-11 DIAGNOSIS — W1830XA Fall on same level, unspecified, initial encounter: Secondary | ICD-10-CM | POA: Diagnosis not present

## 2023-02-11 DIAGNOSIS — S0003XA Contusion of scalp, initial encounter: Secondary | ICD-10-CM | POA: Diagnosis not present

## 2023-02-11 DIAGNOSIS — Z7982 Long term (current) use of aspirin: Secondary | ICD-10-CM | POA: Diagnosis not present

## 2023-02-11 DIAGNOSIS — N189 Chronic kidney disease, unspecified: Secondary | ICD-10-CM | POA: Diagnosis not present

## 2023-02-11 DIAGNOSIS — M4312 Spondylolisthesis, cervical region: Secondary | ICD-10-CM | POA: Diagnosis not present

## 2023-02-11 DIAGNOSIS — W19XXXA Unspecified fall, initial encounter: Secondary | ICD-10-CM

## 2023-02-11 DIAGNOSIS — S0990XA Unspecified injury of head, initial encounter: Secondary | ICD-10-CM | POA: Diagnosis not present

## 2023-02-11 DIAGNOSIS — R451 Restlessness and agitation: Secondary | ICD-10-CM | POA: Diagnosis not present

## 2023-02-11 DIAGNOSIS — M4802 Spinal stenosis, cervical region: Secondary | ICD-10-CM | POA: Diagnosis not present

## 2023-02-11 LAB — CBG MONITORING, ED: Glucose-Capillary: 133 mg/dL — ABNORMAL HIGH (ref 70–99)

## 2023-02-11 MED ORDER — ACETAMINOPHEN 325 MG PO TABS
650.0000 mg | ORAL_TABLET | Freq: Once | ORAL | Status: AC
  Administered 2023-02-11: 650 mg via ORAL
  Filled 2023-02-11: qty 2

## 2023-02-11 NOTE — ED Triage Notes (Signed)
Pt fell trying to transfer to wheelchair striking her head. Pt denies any loc.

## 2023-02-11 NOTE — ED Notes (Signed)
Son Audria Nine updated on pt.

## 2023-02-11 NOTE — ED Notes (Signed)
Paged through [facility] at this time for take home to [place] @6 :44 pm.Glenette Bookwalter

## 2023-02-11 NOTE — ED Provider Notes (Signed)
[place] EMERGENCY DEPARTMENT AT [facility] Provider Note   CSN: [id number] Arrival date & time: 02/11/23  1528     History  Chief Complaint  Patient presents with   [name] is a 72 y.o. female with a past medical history including type 2 diabetes, hypertension, hyperlipidemia, sleep apnea on CPAP, history of CVA, seizure disorder and anemia presenting for evaluation of a fall which occurred just prior to arrival.  She lives in a SNF and describes trying to get out the end of her bed as her CNA was not responding to her calls, she fell trying to get into her wheelchair which was sitting at the end of her bed, striking her head on the floor.  She denies LOC, she denies pain except for posterior scalp.  She has had no treatment prior to arrival.  The history is provided by the patient.       Home Medications Prior to Admission medications   Medication Sig Start Date End Date Taking? Authorizing Provider  acetaminophen (TYLENOL) 325 MG tablet Take 650 mg by mouth every 6 (six) hours as needed for mild pain, moderate pain or headache.    [provider]  alendronate (FOSAMAX) 70 MG tablet TAKE 1 TABLET EVERY 7 DAYS ON AN EMPTY STOMACH WITH A FULL GLASS OF WATER Patient taking differently: Take 70 mg by mouth once a week. 11/07/21   Kerri Perches, MD  amLODipine (NORVASC) 5 MG tablet Take 1 tablet (5 mg total) by mouth daily. 01/17/23   Catarina Hartshorn, MD  aspirin EC 81 MG tablet Take 1 tablet (81 mg total) by mouth daily with breakfast. 12/08/18   Shon Hale, MD  Blood Glucose Calibration (ACCU-CHEK GUIDE CONTROL) LIQD USE AS DIRECTED 12/13/20   Carlus Pavlov, MD  blood glucose meter kit and supplies Dispense based on patient and insurance preference. Use up to four times daily as directed. (FOR ICD-10 E10.9, E11.9). 06/27/19   Carlus Pavlov, MD  carvedilol (COREG) 12.5 MG tablet Take 12.5 mg by mouth 2 (two) times daily with a meal.     [provider]  Cholecalciferol (D3 PO) Take 1 tablet by mouth daily.    [provider]  Continuous Blood Gluc Sensor (FREESTYLE LIBRE 14 DAY SENSOR) MISC 1 each by Does not apply route every 14 (fourteen) days. Change every 2 weeks 06/24/19   Carlus Pavlov, MD  Docusate Sodium (DSS) 100 MG CAPS Take 100 mg by mouth in the morning and at bedtime.    [provider]  DROPLET PEN NEEDLES 31G X 8 MM MISC USE FOR INJECTING INSULIN 4 TIMES DAILY. 09/02/21   Carlus Pavlov, MD  Elastic Bandages & Supports (ADJUSTABLE ARM SLING) MISC L arm sling 05/04/22   Kerri Perches, MD  ezetimibe (ZETIA) 10 MG tablet TAKE 1 TABLET EVERY DAY 01/19/22   Kerri Perches, MD  glucose blood (ACCU-CHEK GUIDE) test strip Use as instructed 12/12/22   Carlus Pavlov, MD  insulin aspart (NOVOLOG) 100 UNIT/ML injection Inject 0-5 Units into the skin at bedtime. 01/26/23   Dahal, Melina Schools, MD  insulin aspart (NOVOLOG) 100 UNIT/ML injection Inject 0-15 Units into the skin 3 (three) times daily with meals. 01/26/23   Lorin Glass, MD  insulin glargine-yfgn (SEMGLEE) 100 UNIT/ML injection Inject 0.1 mLs (10 Units total) into the skin at bedtime. 01/26/23   Dahal, Melina Schools, MD  ipratropium-albuterol (DUONEB) 0.5-2.5 (3) MG/3ML SOLN Take 3 mLs by nebulization every 6 (  six) hours as needed. 12/08/22   Waymon Budge, MD  ketoconazole (NIZORAL) 2 % shampoo Apply 1 Application topically 2 (two) times a week. 10/16/22   Kerri Perches, MD  lamoTRIgine (LAMICTAL) 150 MG tablet Take 1 tablet (150 mg total) by mouth 2 (two) times daily. 01/26/23 02/25/23  Lorin Glass, MD  levothyroxine (SYNTHROID) 50 MCG tablet TAKE 1 TABLET DAILY SIX DAYS A WEEK AND 1/2 TABLET ON ONE DAY A WEEK Patient taking differently: Take 50 mcg by mouth See admin instructions. Take 50 mg by mouth for 6 days a week and 25 mg on one day a week 11/15/22   Carlus Pavlov, MD  melatonin 3 MG TABS tablet Take 1 tablet (3 mg  total) by mouth at bedtime as needed. 01/26/23   Lorin Glass, MD  mirabegron ER (MYRBETRIQ) 50 MG TB24 tablet Take 1 tablet (50 mg total) by mouth daily. 12/18/22   Donnita Falls, FNP  Misc. Devices (MATTRESS PAD) MISC FIRM MATTRESS PAD for hospital bed x 1 02/22/21   Kerri Perches, MD  montelukast (SINGULAIR) 10 MG tablet Take 10 mg by mouth daily. 10/27/22   [provider]  Multiple Vitamins-Minerals (MULTIVITAMIN GUMMIES ADULT PO) Take 1 tablet by mouth daily.    [provider]  ondansetron (ZOFRAN) 4 MG tablet Take one tablet by mouth once daily, as needed , for nausea 12/12/22   Kerri Perches, MD  polyethylene glycol (MIRALAX / GLYCOLAX) 17 g packet Take 17 g by mouth daily as needed. 01/26/23   Lorin Glass, MD  pregabalin (LYRICA) 75 MG capsule Take 1 capsule (75 mg total) by mouth daily. 01/26/23 02/25/23  Lorin Glass, MD  RABEprazole (ACIPHEX) 20 MG tablet TAKE 1 TABLET TWICE DAILY 12/28/22   Pyrtle, Carie Caddy, MD  RESTASIS 0.05 % ophthalmic emulsion Place 2 drops into both eyes daily. 07/29/20   [provider]  rosuvastatin (CRESTOR) 5 MG tablet TAKE 1 TABLET AT BEDTIME 06/12/22   Kerri Perches, MD  senna-docusate (SENOKOT-S) 8.6-50 MG tablet Take 1 tablet by mouth 2 (two) times daily. 01/26/23   Lorin Glass, MD  sertraline (ZOLOFT) 100 MG tablet Take 1 tablet (100 mg total) by mouth every morning. 01/26/23 02/25/23  Lorin Glass, MD  UNABLE TO FIND Med Name:  G5 Mattress 07/31/22   Kerri Perches, MD  vitamin E 180 MG (400 UNITS) capsule Take 400 Units by mouth daily.    [provider]      Allergies    Iron, Milk (cow), Penicillins, Phenazopyridine, Cephalexin, Flonase [fluticasone], and Milk-related compounds    Review of Systems   Review of Systems  Constitutional:  Negative for chills and fever.  HENT:  Negative for congestion.   Eyes: Negative.   Respiratory:  Negative for chest tightness and shortness of breath.    Cardiovascular:  Negative for chest pain.  Gastrointestinal:  Negative for abdominal pain, nausea and vomiting.  Genitourinary: Negative.   Musculoskeletal:  Negative for arthralgias, joint swelling and neck pain.  Skin: Negative.  Negative for rash and wound.  Neurological:  Positive for headaches. Negative for dizziness, syncope, weakness, light-headedness and numbness.  Psychiatric/Behavioral: Negative.    All other systems reviewed and are negative.   Physical Exam Updated Vital Signs BP (!) 145/62   Pulse 68   Temp 99.1 F (37.3 C)   Resp (!) 23   Ht 4\' 11"  (1.499 m)   Wt 70 kg   SpO2 97%   BMI  31.17 kg/m  Physical Exam Vitals and nursing note reviewed.  Constitutional:      Appearance: She is well-developed.  HENT:     Head: Normocephalic.     Comments: Abrasion left parietal scalp, small soft hematoma.      Right Ear: Tympanic membrane normal.     Left Ear: Tympanic membrane normal.  Eyes:     Extraocular Movements: Extraocular movements intact.     Pupils: Pupils are equal, round, and reactive to light.  Cardiovascular:     Rate and Rhythm: Normal rate.     Heart sounds: Normal heart sounds.  Pulmonary:     Effort: Pulmonary effort is normal.  Abdominal:     Palpations: Abdomen is soft.     Tenderness: There is no abdominal tenderness.  Musculoskeletal:        General: No tenderness. Normal range of motion.     Cervical back: Normal range of motion and neck supple.     Comments: Moves all 4 extremities without pain.  Lymphadenopathy:     Cervical: No cervical adenopathy.  Skin:    General: Skin is warm and dry.     Findings: No rash.  Neurological:     General: No focal deficit present.     Mental Status: She is alert and oriented to person, place, and time.     GCS: GCS eye subscore is 4. GCS verbal subscore is 5. GCS motor subscore is 6.     Cranial Nerves: No cranial nerve deficit.     Sensory: No sensory deficit.     Coordination: Coordination  normal.     Gait: Gait normal.     Deep Tendon Reflexes: Reflexes normal.     Comments: Normal heel-shin, normal rapid alternating movements. Cranial nerves III-XII intact.  No pronator drift.  Psychiatric:        Speech: Speech normal.        Behavior: Behavior normal.        Thought Content: Thought content normal.     ED Results / Procedures / Treatments   Labs (all labs ordered are listed, but only abnormal results are displayed) Labs Reviewed  CBG MONITORING, ED - Abnormal; Notable for the following components:      Result Value   Glucose-Capillary 133 (*)    All other components within normal limits    EKG None  Radiology CT Cervical Spine Wo Contrast  Result Date: 02/11/2023 CLINICAL DATA:  Fall while transferring to wheelchair. Patient struck her head. EXAM: CT CERVICAL SPINE WITHOUT CONTRAST TECHNIQUE: Multidetector CT imaging of the cervical spine was performed without intravenous contrast. Multiplanar CT image reconstructions were also generated. RADIATION DOSE REDUCTION: This exam was performed according to the departmental dose-optimization program which includes automated exposure control, adjustment of the mA and/or kV according to patient size and/or use of iterative reconstruction technique. COMPARISON:  CT head without con scratched at CT cervical spine 02/03/2023. FINDINGS: Alignment: Slight degenerative anterolisthesis at C4-5 is stable. Mild straightening of the normal cervical lordosis is stable. Skull base and vertebrae: Craniocervical junction is normal. The vertebral body heights are normal. No acute or healing fractures are present. Soft tissues and spinal canal: No prevertebral fluid or swelling. No visible canal hematoma. Calcifications are present at carotid bifurcations bilaterally without definite stenosis ease. Disc levels: Mild foraminal narrowing is present bilaterally at C5-6 and C6-7 secondary to uncovertebral and facet disease. Moderate left foraminal  stenosis is present at C4-5 secondary to facet spurring. Upper chest:  The lung apices are clear. The thoracic inlet is within normal limits. No pneumothorax is present. IMPRESSION: 1. No acute or healing fractures of the cervical spine. 2. Stable degenerative anterolisthesis at C4-5. 3. Mild foraminal narrowing bilaterally at C5-6 and C6-7 secondary to uncovertebral and facet disease. 4. Moderate left foraminal stenosis at C4-5 secondary to facet spurring. Electronically Signed   By: Marin Roberts M.D.   On: 02/11/2023 16:52   CT Head Wo Contrast  Result Date: 02/11/2023 CLINICAL DATA:  Trauma. Hit head attempting to transfer to wheelchair. EXAM: CT HEAD WITHOUT CONTRAST TECHNIQUE: Contiguous axial images were obtained from the base of the skull through the vertex without intravenous contrast. RADIATION DOSE REDUCTION: This exam was performed according to the departmental dose-optimization program which includes automated exposure control, adjustment of the mA and/or kV according to patient size and/or use of iterative reconstruction technique. COMPARISON:  CT head without contrast 02/03/2023. MR head without contrast 01/14/2023. FINDINGS: Brain: Chronic bilateral frontal lobe encephalomalacia is stable. No acute infarct, hemorrhage, or mass lesion is present. The ventricles are proportionate to the degree of atrophy. Deep brain nuclei are within normal limits. No significant extraaxial fluid collection is present. The brainstem and cerebellum are within normal limits. Midline structures are within normal limits. Vascular: Atherosclerotic calcifications are present within the cavernous internal carotid arteries and at the dural margin of both vertebral arteries. No hyperdense vessel is present. Skull: Left paramedian parietal scalp soft tissue swelling is present at the vertex. No underlying fracture is present. No other significant extracranial soft tissue injury is present. Bifrontal craniotomies are  again noted. Sinuses/Orbits: The paranasal sinuses and mastoid air cells are clear. Bilateral lens replacements are noted. Globes and orbits are otherwise unremarkable. IMPRESSION: 1. Left paramedian parietal scalp soft tissue swelling at the vertex without underlying fracture. 2. No acute intracranial abnormality or significant interval change. 3. Stable chronic bilateral frontal lobe encephalomalacia. Electronically Signed   By: Marin Roberts M.D.   On: 02/11/2023 16:49    Procedures Procedures    Medications Ordered in ED Medications  acetaminophen (TYLENOL) tablet 650 mg (has no administration in time range)    ED Course/ Medical Decision Making/ A&P                                 Medical Decision Making Patient presenting for evaluation of a fall which occurred just prior to arrival at her nursing home, she fell trying to crawl up the end of her bed and into her wheelchair.  There is no LOC.  Exam is reassuring, she moves all extremities without difficulty or complaint.  She does have complaint of bilateral soft tissue neck pain and tender scalp where she fell, no other complaints.  Amount and/or Complexity of Data Reviewed Radiology: ordered.    Details: Radiology reviewed CT head and C-spine negative for acute injuries.           Final Clinical Impression(s) / ED Diagnoses Final diagnoses:  Fall, initial encounter  Minor head injury, initial encounter  Neck pain  Contusion of scalp, initial encounter    Rx / DC Orders ED Discharge Orders     None         Victoriano Lain 02/11/23 1825    Royanne Foots, DO 02/17/23 1057

## 2023-02-11 NOTE — ED Notes (Signed)
Attempted report to [facility] but no one answered the phone.

## 2023-02-11 NOTE — Discharge Instructions (Addendum)
Your CT scans of your head and cervical spine are negative for acute injury.  Your scalp does not require sutures, this injury should heal without difficulty.  I recommend taking Tylenol if needed for headache pain.

## 2023-02-12 DIAGNOSIS — I10 Essential (primary) hypertension: Secondary | ICD-10-CM | POA: Diagnosis not present

## 2023-02-12 DIAGNOSIS — E119 Type 2 diabetes mellitus without complications: Secondary | ICD-10-CM | POA: Diagnosis not present

## 2023-02-12 DIAGNOSIS — R451 Restlessness and agitation: Secondary | ICD-10-CM | POA: Diagnosis not present

## 2023-02-12 DIAGNOSIS — F339 Major depressive disorder, recurrent, unspecified: Secondary | ICD-10-CM | POA: Diagnosis not present

## 2023-02-12 DIAGNOSIS — F411 Generalized anxiety disorder: Secondary | ICD-10-CM | POA: Diagnosis not present

## 2023-02-12 DIAGNOSIS — M6281 Muscle weakness (generalized): Secondary | ICD-10-CM | POA: Diagnosis not present

## 2023-02-12 DIAGNOSIS — N189 Chronic kidney disease, unspecified: Secondary | ICD-10-CM | POA: Diagnosis not present

## 2023-02-12 DIAGNOSIS — G40909 Epilepsy, unspecified, not intractable, without status epilepticus: Secondary | ICD-10-CM | POA: Diagnosis not present

## 2023-02-14 DIAGNOSIS — N189 Chronic kidney disease, unspecified: Secondary | ICD-10-CM | POA: Diagnosis not present

## 2023-02-14 DIAGNOSIS — R451 Restlessness and agitation: Secondary | ICD-10-CM | POA: Diagnosis not present

## 2023-02-14 DIAGNOSIS — E119 Type 2 diabetes mellitus without complications: Secondary | ICD-10-CM | POA: Diagnosis not present

## 2023-02-14 DIAGNOSIS — F339 Major depressive disorder, recurrent, unspecified: Secondary | ICD-10-CM | POA: Diagnosis not present

## 2023-02-14 DIAGNOSIS — I10 Essential (primary) hypertension: Secondary | ICD-10-CM | POA: Diagnosis not present

## 2023-02-14 DIAGNOSIS — M6281 Muscle weakness (generalized): Secondary | ICD-10-CM | POA: Diagnosis not present

## 2023-02-14 DIAGNOSIS — F411 Generalized anxiety disorder: Secondary | ICD-10-CM | POA: Diagnosis not present

## 2023-02-14 DIAGNOSIS — G40909 Epilepsy, unspecified, not intractable, without status epilepticus: Secondary | ICD-10-CM | POA: Diagnosis not present

## 2023-02-15 ENCOUNTER — Ambulatory Visit: Payer: Self-pay | Admitting: *Deleted

## 2023-02-15 ENCOUNTER — Other Ambulatory Visit: Payer: Medicare HMO | Admitting: *Deleted

## 2023-02-15 DIAGNOSIS — F411 Generalized anxiety disorder: Secondary | ICD-10-CM | POA: Diagnosis not present

## 2023-02-15 DIAGNOSIS — Z9181 History of falling: Secondary | ICD-10-CM | POA: Diagnosis not present

## 2023-02-15 NOTE — Patient Instructions (Signed)
Visit Information  Thank you for taking time to visit with me today. Please don't hesitate to contact me if I can be of assistance to you.   Following are the goals we discussed today:   Goals Addressed             This Visit's Progress    Reduce risks of falls + RNCM Care Management  (HYPERTENSION)  EXPECTED OUTCOME:  MONITOR,SELF- MANAGE AND REDUCE SYMPTOMS OF HYPERTENSION   Not on track    Current Barriers:  Knowledge Deficits related to Hypertension, fall prevention  Chronic Disease Management support and education needs related to management of Hypertension 02/15/23 Patient noted to be residing in [facility] facility per recent ED note. Patient reports she does smoke a "few cigarettes" per day but declines smoking cessation, states she continues to weigh daily for CHF Patient reports she continues following up with counselor and psychiatrist for mental health issues, feels this is helping and states " I can get my feelings out" Falls noted with ED visits x 2 in the last 3 months on 11/23 & 02/11/23 BP Readings from Last 3 Encounters:  02/11/23 (!) 189/83  02/06/23 127/62  02/03/23 (!) 157/63    Interventions Today    Flowsheet Row Most Recent Value  Chronic Disease   Chronic disease during today's visit Other  [Patient appears to be at [facility] per recent ED notes]        Planned Interventions: Evaluation of current treatment plan related to hypertension self management and patient's adherence to plan as established by provider. Review of blood pressures being elevated. She says it usually is high at her provider visit. Discussed white coat syndrome. The patient states it is good at home.    Symptom Management: Take medications as prescribed   Attend all scheduled provider appointments Call pharmacy for medication refills 3-7 days in advance of running out of medications Attend church or other social activities Call provider office  for new concerns or questions  check blood pressure 3 times per week choose a place to take my blood pressure (home, clinic or office, retail store) write blood pressure results in a log or diary learn about high blood pressure take blood pressure log to all doctor appointments call doctor for signs and symptoms of high blood pressure develop an action plan for high blood pressure keep all doctor appointments take medications for blood pressure exactly as prescribed begin an exercise program report new symptoms to your doctor eat more whole grains, fruits and vegetables, lean meats and healthy fats Continue to follow low salt diet- read labels Continue following up with your counselor and psychiatrist Continue to weigh daily  Follow Up Plan: Telephone follow up appointment with care management team member scheduled for:  03/20/23 at 0945 am pending possible facility discharge          Our next appointment is by telephone on 03/21/23 at 0945  Please call the care guide team at [phone] if you need to cancel or reschedule your appointment.   If you are experiencing a Mental Health or Behavioral Health Crisis or need someone to talk to, please call the Suicide and Crisis Lifeline: 988 call the [place] National Suicide Prevention Lifeline: 306-177-5873 or TTY: 707-204-6600 TTY 7706666320) to talk to a trained counselor call 1-800-273-TALK (toll free, 24 hour hotline) call the [facility]: 504-173-0412 call 911   Patient verbalizes understanding of instructions and care plan provided today and agrees to  view in MyChart. Active MyChart status and patient understanding of how to access instructions and care plan via MyChart confirmed with patient.     The patient has been provided with contact information for the care management team and has been advised to call with any health related questions or concerns.   Aevah Stansbery L. Noelle Penner, RN, BSN, [facility]  VBCI Care Management  Coordinator  [phone]  Fax: [phone]

## 2023-02-15 NOTE — Progress Notes (Deleted)
Name: [name] DOB: 03/19/1950 MRN: [id number]  History of Present Illness: [name] is a 72 y.o. female who presents today for follow up visit at [facility]. She is accompanied by ***. - GU history: 1. OAB with urinary frequency, nocturia, urgency, nocturnal enuresis. 2. Mixed urinary incontinence (urge predominant).  At initial visit on 12/18/2022: The plan was: 1. Increase Myrbetriq to 50 mg daily. 2. Minimize caffeine intake. 3. Work on timed voiding / bladder retraining. 4. Continue routine use of CPAP for OSA. 5. Continue to work on glycemic management. 6. Expectant management for SUI while considering treatment options.    Since last visit: > 01/12/2023 - 01/17/2023: Admitted for acute metabolic encephalopathy which was multifactorial including dehydration/AKI and polypharmacy. Negative urine culture (I&O cath specimen). Discharged to SNF.  > Seen in ER twice after that for falls.  Today: She {Actions; denies-reports:120008} symptomatic improvement since last visit.  She reports {Blank multiple:19197::"improved","persistent / unchanged"} urinary ***frequency, ***nocturia, ***urgency, and ***urge incontinence. Voiding ***x/day and ***x/night on average. Leaking ***x/day on average; using *** ***pads / ***diapers per day on average.  She {Actions; denies-reports:120008} caffeine intake (*** caffeinated beverages per day on average).  She {Actions; denies-reports:120008} dysuria, gross hematuria, straining to void, or sensations of incomplete emptying.   Fall Screening: Do you usually have a device to assist in your mobility? {yes/no:20286} ***cane / ***walker / ***wheelchair   Medications: Current Outpatient Medications  Medication Sig Dispense Refill   acetaminophen (TYLENOL) 325 MG tablet Take 650 mg by mouth every 6 (six) hours as needed for mild pain, moderate pain or headache.     alendronate (FOSAMAX) 70 MG tablet TAKE 1 TABLET EVERY 7 DAYS  ON AN EMPTY STOMACH WITH A FULL GLASS OF WATER (Patient taking differently: Take 70 mg by mouth once a week.) 12 tablet 3   amLODipine (NORVASC) 5 MG tablet Take 1 tablet (5 mg total) by mouth daily.     aspirin EC 81 MG tablet Take 1 tablet (81 mg total) by mouth daily with breakfast. 120 tablet 2   Blood Glucose Calibration (ACCU-CHEK GUIDE CONTROL) LIQD USE AS DIRECTED 1 each 0   blood glucose meter kit and supplies Dispense based on patient and insurance preference. Use up to four times daily as directed. (FOR ICD-10 E10.9, E11.9). 1 each 0   carvedilol (COREG) 12.5 MG tablet Take 12.5 mg by mouth 2 (two) times daily with a meal.     Cholecalciferol (D3 PO) Take 1 tablet by mouth daily.     Continuous Blood Gluc Sensor (FREESTYLE LIBRE 14 DAY SENSOR) MISC 1 each by Does not apply route every 14 (fourteen) days. Change every 2 weeks 2 each 11   Docusate Sodium (DSS) 100 MG CAPS Take 100 mg by mouth in the morning and at bedtime.     DROPLET PEN NEEDLES 31G X 8 MM MISC USE FOR INJECTING INSULIN 4 TIMES DAILY. 400 each 0   Elastic Bandages & Supports (ADJUSTABLE ARM SLING) MISC L arm sling 1 each 0   ezetimibe (ZETIA) 10 MG tablet TAKE 1 TABLET EVERY DAY 90 tablet 10   glucose blood (ACCU-CHEK GUIDE) test strip Use as instructed 400 strip 3   insulin aspart (NOVOLOG) 100 UNIT/ML injection Inject 0-5 Units into the skin at bedtime.     insulin aspart (NOVOLOG) 100 UNIT/ML injection Inject 0-15 Units into the skin 3 (three) times daily with meals.     insulin glargine, 2 Unit Dial, (TOUJEO MAX  SOLOSTAR) 300 UNIT/ML Solostar Pen INJECT 20-26 UNITS INTO THE SKIN DAILY. 12 mL 1   insulin glargine-yfgn (SEMGLEE) 100 UNIT/ML injection Inject 0.1 mLs (10 Units total) into the skin at bedtime.     ipratropium-albuterol (DUONEB) 0.5-2.5 (3) MG/3ML SOLN Take 3 mLs by nebulization every 6 (six) hours as needed. 360 mL 12   ketoconazole (NIZORAL) 2 % shampoo Apply 1 Application topically 2 (two) times a week.  120 mL 0   lamoTRIgine (LAMICTAL) 150 MG tablet Take 1 tablet (150 mg total) by mouth 2 (two) times daily. 60 tablet 0   levothyroxine (SYNTHROID) 50 MCG tablet TAKE 1 TABLET DAILY SIX DAYS A WEEK AND 1/2 TABLET ON ONE DAY A WEEK (Patient taking differently: Take 50 mcg by mouth See admin instructions. Take 50 mg by mouth for 6 days a week and 25 mg on one day a week) 85 tablet 2   melatonin 3 MG TABS tablet Take 1 tablet (3 mg total) by mouth at bedtime as needed.     mirabegron ER (MYRBETRIQ) 50 MG TB24 tablet Take 1 tablet (50 mg total) by mouth daily. 30 tablet 5   Misc. Devices (MATTRESS PAD) MISC FIRM MATTRESS PAD for hospital bed x 1 1 each 0   montelukast (SINGULAIR) 10 MG tablet Take 10 mg by mouth daily.     Multiple Vitamins-Minerals (MULTIVITAMIN GUMMIES ADULT PO) Take 1 tablet by mouth daily.     ondansetron (ZOFRAN) 4 MG tablet Take one tablet by mouth once daily, as needed , for nausea 30 tablet 2   polyethylene glycol (MIRALAX / GLYCOLAX) 17 g packet Take 17 g by mouth daily as needed.     pregabalin (LYRICA) 75 MG capsule Take 1 capsule (75 mg total) by mouth daily. 30 capsule 0   RABEprazole (ACIPHEX) 20 MG tablet TAKE 1 TABLET TWICE DAILY 180 tablet 0   RESTASIS 0.05 % ophthalmic emulsion Place 2 drops into both eyes daily.     rosuvastatin (CRESTOR) 5 MG tablet TAKE 1 TABLET AT BEDTIME 90 tablet 3   senna-docusate (SENOKOT-S) 8.6-50 MG tablet Take 1 tablet by mouth 2 (two) times daily.     sertraline (ZOLOFT) 100 MG tablet Take 1 tablet (100 mg total) by mouth every morning. 30 tablet 0   UNABLE TO FIND Med Name:  G5 Mattress 1 each 0   vitamin E 180 MG (400 UNITS) capsule Take 400 Units by mouth daily.     No current facility-administered medications for this visit.    Allergies: Allergies  Allergen Reactions   Iron Itching and Nausea And Vomiting   Milk (Cow) Rash and Other (See Comments)    Doesn't agree with the stomach, also    Penicillins Hives    Phenazopyridine Hives   Cephalexin Hives   Flonase [Fluticasone] Other (See Comments)    "It gave me ulcers in my nose"   Milk-Related Compounds Nausea Only and Other (See Comments)    Doesn't agree with the stomach    Past Medical History:  Diagnosis Date   Allergy    Anemia    Anemia in chronic kidney disease (CKD) 08/10/2021   Anemia in chronic kidney disease (CKD) 10/12/2021   Anxiety    takes Ativan daily   Arthritis    Assistance needed for mobility    Bipolar disorder (HCC)    takes Risperdal nightly   Blood transfusion    Brain tumor (HCC)    Cancer (HCC)    In her gum  Carpal tunnel syndrome of right wrist 05/23/2011   Cervical disc disorder with radiculopathy of cervical region 10/31/2012   Chronic back pain    Chronic idiopathic constipation    Chronic neck and back pain    Colon polyps    COPD (chronic obstructive pulmonary disease) with chronic bronchitis (HCC) 09/16/2013   Office Spirometry 10/30/2013-submaximal effort based on appearance of loop and curve. Numbers would fit with severe restriction but her physiologic capability may be better than this. FVC 0.91/44%, and 10.74/45%, FEV1/FVC 0.81, FEF 25-75% 1.43/69%     Diabetes mellitus    Type II   Diverticulosis    TCS 9/08 by Dr. Lina Sar for diarrhea . Bx for micro scopic colitis negative.    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   Metabolic encephalopathy 08/03/2011   Migraines    chronic headaches   Mononeuritis  lower limb    Narcolepsy    Osteoporosis    Pancreatitis 2006   due to Depakote with normal EUS    Paralysis (HCC)    Pneumonia    Schatzki's ring    non critical / EGD with ED 8/2011with RMR   Seizures (HCC)    takes Lamictal daily.Last seizure 3 yrs ago   Sleep apnea    on CPAP   Small bowel obstruction (HCC)    Stroke (HCC)    left sided weakness, speech changes   Tubular adenoma of colon    Past Surgical History:  Procedure Laterality Date   ABDOMINAL HYSTERECTOMY  1978   BACK SURGERY  July 2012   BACTERIAL OVERGROWTH TEST N/A 05/05/2013   Procedure: BACTERIAL OVERGROWTH TEST;  Surgeon: Corbin Ade, MD;  Location: [facility];  Service: Endoscopy;  Laterality: N/A;  7:30   BIOPSY THYROID  2009   BRAIN SURGERY  11/2011   resection of meningioma   BREAST REDUCTION SURGERY  1994   CARDIAC CATHETERIZATION  05/10/2005   normal coronaries, normal LV systolic function and EF (Dr. Evlyn Courier)   CARPAL TUNNEL RELEASE Left 07/22/04   Dr. Romeo Apple   CATARACT EXTRACTION Bilateral    CHOLECYSTECTOMY  1984   COLONOSCOPY N/A 09/25/2012   FAO:ZHYQMVH diverticulosis.  colonic polyp-removed : tubular adenoma   CRANIOTOMY  11/23/2011   Procedure: CRANIOTOMY TUMOR EXCISION;  Surgeon: Hewitt Shorts, MD;  Location: [facility];  Service: Neurosurgery;  Laterality: N/A;  Craniotomy for tumor resection   ESOPHAGOGASTRODUODENOSCOPY  12/29/2010   Rourk-Retained food in the esophagus and stomach, small hiatal hernia, status post Maloney dilation of the esophagus   ESOPHAGOGASTRODUODENOSCOPY N/A 09/25/2012   QIO:NGEXBMWU atonic baggy esophagus status post Maloney dilation 56 F. Hiatal hernia   GIVENS CAPSULE STUDY N/A 01/15/2013   NORMAL.    IR GENERIC HISTORICAL  03/17/2016   IR RADIOLOGIST EVAL & MGMT 03/17/2016 [facility]   LESION REMOVAL N/A 05/31/2015   Procedure: REMOVAL RIGHT AND  LEFT LESIONS OF MANDIBLE;  Surgeon: Ocie Doyne, DDS;  Location: [facility];  Service: Oral Surgery;  Laterality:  N/A;   MALONEY DILATION  12/29/2010   RMR;   NM MYOCAR PERF WALL MOTION  2006   "relavtiely normal" persantine, mild anterior thinning (breast attenuation artifact), no region of scar/ischemia   OVARIAN CYST REMOVAL     RECTOCELE REPAIR N/A 06/29/2015   Procedure: POSTERIOR REPAIR (RECTOCELE);  Surgeon: Tilda Burrow, MD;  Location: [facility];  Service: Gynecology;  Laterality: N/A;   REDUCTION MAMMAPLASTY Bilateral    REVERSE SHOULDER ARTHROPLASTY Left 08/18/2022   Procedure: REVERSE SHOULDER ARTHROPLASTY;  Surgeon: Beverely Low, MD;  Location: [facility];  Service: Orthopedics;  Laterality: Left;  choice with interscalene, general   SPINE SURGERY  09/29/2010   Dr. Shon Baton   surgical excision of 3 tumors from right thigh and right buttock  and left upper thigh  2010   TOOTH EXTRACTION Bilateral 12/14/2014   Procedure: REMOVAL OF BILATERAL MANDIBULAR EXOSTOSES;  Surgeon: Ocie Doyne, DDS;  Location: [facility] OR;  Service: Oral Surgery;  Laterality: Bilateral;   TRANSTHORACIC ECHOCARDIOGRAM  2010   EF 60-65%, mild conc LVH, grade 1 diastolic dysfunction; mildly calcified MV annulus with mildly thickened leaflets, mildly calcified MR annulus   Family History  Problem Relation Age of Onset   Heart attack Mother        HTN   Pneumonia Father    Kidney failure Father    Diabetes Father    Pancreatic cancer Sister    Cancer Sister        breast    Cancer Sister        pancreatic   Diabetes Brother    Hypertension Brother    Diabetes Brother    Alcohol abuse Maternal Uncle    Stroke Maternal Grandmother    Heart attack Maternal Grandfather    Hypertension Son    Sleep apnea Son    Colon cancer Neg Hx    Anesthesia problems Neg Hx    Hypotension Neg Hx    Malignant hyperthermia Neg Hx    Pseudochol deficiency Neg Hx    Breast cancer Neg Hx    Stomach cancer Neg Hx    Social History   Socioeconomic History   Marital status: Legally Separated    Spouse name: Not on file   Number of  children: 1   Years of education: 12   Highest education level: High school graduate  Occupational History   Occupation: Disabled  Tobacco Use   Smoking status: Some Days    Current packs/day: 0.00    Average packs/day: 0.5 packs/day for 47.0 years (23.5 ttl pk-yrs)    Types: Cigarettes    Start date: 07/06/1974    Last attempt to quit: 07/05/2021    Years since quitting: 1.6    Passive exposure: Past   Smokeless tobacco: Never   Tobacco comments:    3-4 a day   Vaping Use   Vaping status: Never Used  Substance and Sexual Activity   Alcohol use: No    Alcohol/week: 0.0 standard drinks of alcohol    Comment:     Drug use: No   Sexual activity: Not Currently  Other Topics Concern   Not on file  Social History Narrative   01/29/18 Lives alone, has 3 aides, Mon- Fri 8 hrs, 2 hrs on Sat-Sun, RN manages her meds   Caffeine use: Drink coffee sometimes    Right handed  Social Determinants of Health   Financial Resource Strain: Low Risk  (07/17/2022)   Overall Financial Resource Strain (CARDIA)    Difficulty of Paying Living Expenses: Not very hard  Food Insecurity: No Food Insecurity (01/12/2023)   Hunger Vital Sign    Worried About Running Out of Food in the Last Year: Never true    Ran Out of Food in the Last Year: Never true  Transportation Needs: No Transportation Needs (01/12/2023)   PRAPARE - Administrator, Civil Service (Medical): No    Lack of Transportation (Non-Medical): No  Physical Activity: Insufficiently Active (07/17/2022)   Exercise Vital Sign    Days of Exercise per Week: 2 days    Minutes of Exercise per Session: 20 min  Stress: No Stress Concern Present (07/17/2022)   [employer] of Occupational Health - Occupational Stress Questionnaire    Feeling of Stress : Not at all  Social Connections: Moderately Integrated (07/17/2022)   Social Connection and Isolation Panel [NHANES]    Frequency of Communication with Friends and Family: Three times a  week    Frequency of Social Gatherings with Friends and Family: Once a week    Attends Religious Services: More than 4 times per year    Active Member of [employer] or Organizations: Yes    Attends Engineer, structural: More than 4 times per year    Marital Status: Divorced  Intimate Partner Violence: Not At Risk (01/12/2023)   Humiliation, Afraid, Rape, and Kick questionnaire    Fear of Current or Ex-Partner: No    Emotionally Abused: No    Physically Abused: No    Sexually Abused: No    Review of Systems*** Constitutional: Patient denies any unintentional weight loss or change in strength lntegumentary: Patient denies any rashes or pruritus Eyes: Patient {Actions; denies-reports:120008} dry eyes ENT: Patient {Actions; denies-reports:120008} dry mouth Cardiovascular: Patient denies chest pain or syncope Respiratory: Patient denies shortness of breath Gastrointestinal: Patient ***denies nausea, vomiting, constipation, or diarrhea Musculoskeletal: Patient denies muscle cramps or weakness Neurologic: Patient denies convulsions or seizures Allergic/Immunologic: Patient denies recent allergic reaction(s) Hematologic/Lymphatic: Patient denies bleeding tendencies Endocrine: Patient denies heat/cold intolerance  GU: As per HPI.  OBJECTIVE There were no vitals filed for this visit. There is no height or weight on file to calculate BMI.  Physical Examination*** Constitutional: No obvious distress; patient is non-toxic appearing  Cardiovascular: No visible lower extremity edema.  Respiratory: The patient does not have audible wheezing/stridor; respirations do not appear labored  Gastrointestinal: Abdomen non-distended Musculoskeletal: Normal ROM of UEs  Skin: No obvious rashes/open sores  Neurologic: CN 2-12 grossly intact Psychiatric: Answered questions appropriately with normal affect  Hematologic/Lymphatic/Immunologic: No obvious bruises or sites of spontaneous bleeding  UA:  ***negative *** WBC/hpf, *** RBC/hpf, bacteria (***) PVR: *** ml  ASSESSMENT No diagnosis found. *** We discussed the symptoms of overactive bladder (OAB), which include urinary urgency, frequency, nocturia, with or without urge incontinence.   While we may not know the exact etiology of OAB, several risk factors can be identified.  - We discussed this patient's neurogenic risk factors for OAB-type symptoms including ***T2DM, ***nicotine use, ***.  - Likely exacerbated by ***diuretic use, ***caffeine intake, ***consumption of bladder irritants such as (acidic foods, spicy foods, alcohol).   We discussed the following management options in detail including potential benefits, risks, and side effects: Behavioral therapy: Modify fluid intake Decreasing bladder irritants (such as caffeine, acidic foods, spicy foods, alcohol) Urge suppression strategies Bladder retraining /  timed voiding Double voiding Medication(s): - For anticholinergic medications, we discussed the potential side effects of anticholinergics including dry eyes, dry mouth, constipation, cognitive impairment and urinary retention.  - For beta-3 agonist medication, we discussed the risk for urinary retention and the potential side effect of elevated blood pressure specific to Myrbetriq (which is more likely to occur in individuals with uncontrolled hypertension).  For refractory cases: PTNS (posterior tibial nerve stimulation) Sacral neuromodulation trial (Medtronic lnterStim or Axonics implant) Bladder Botox injections In extreme cases, SP tube placement  ***In order to further evaluate urinary incontinence, She was instructed to complete a 3-day bladder diary.  ***Discussed recommendation for urodynamic testing, which was described in detail including risks such as bleeding, infection, organ/tissue/nerve damage.  She decided to proceed with *** ***work on behavioral modifications including ***minimizing caffeine intake  and working on ***timed voiding.  Will plan for follow up in ***8 weeks / *** months / ***1 year or sooner if needed. Pt verbalized understanding and agreement. All questions were answered.  PLAN Advised the following: *** ***. Minimize caffeine intake. ***. Work on timed voiding. ***. Urge suppression strategies. ***. Double/ triple voiding. ***. No follow-ups on file.  No orders of the defined types were placed in this encounter.   It has been explained that the patient is to follow regularly with their PCP in addition to all other providers involved in their care and to follow instructions provided by these respective offices. Patient advised to contact urology clinic if any urologic-pertaining questions, concerns, new symptoms or problems arise in the interim period.  There are no Patient Instructions on file for this visit.  Electronically signed by:  Donnita Falls, FNP   02/15/23    2:36 PM

## 2023-02-15 NOTE — Patient Outreach (Signed)
  Care Coordination   02/15/2023 Name: [name] MRN: [id number] DOB: 1950/11/25   Care Coordination Outreach Attempts:  An unsuccessful telephone outreach was attempted today to offer the patient information about available care coordination services.  Follow Up Plan:  Additional outreach attempts will be made to offer the patient care coordination information and services.   Encounter Outcome:  No Answer   Care Coordination Interventions:  Yes, provided   Interventions Today    Flowsheet Row Most Recent Value  Chronic Disease   Chronic disease during today's visit Other  [Patient appears to be at [facility] per recent ED notes]       Cala Bradford L. Noelle Penner, RN, BSN, [facility]  VBCI Care Management Coordinator  [phone]  Fax: [phone]

## 2023-02-16 ENCOUNTER — Encounter (INDEPENDENT_AMBULATORY_CARE_PROVIDER_SITE_OTHER): Payer: Medicare HMO | Admitting: Urology

## 2023-02-16 VITALS — BP 176/55 | HR 73 | Temp 97.9°F

## 2023-02-16 DIAGNOSIS — N3946 Mixed incontinence: Secondary | ICD-10-CM

## 2023-02-16 DIAGNOSIS — F339 Major depressive disorder, recurrent, unspecified: Secondary | ICD-10-CM | POA: Diagnosis not present

## 2023-02-16 DIAGNOSIS — N189 Chronic kidney disease, unspecified: Secondary | ICD-10-CM | POA: Diagnosis not present

## 2023-02-16 DIAGNOSIS — M6281 Muscle weakness (generalized): Secondary | ICD-10-CM | POA: Diagnosis not present

## 2023-02-16 DIAGNOSIS — R32 Unspecified urinary incontinence: Secondary | ICD-10-CM

## 2023-02-16 DIAGNOSIS — R296 Repeated falls: Secondary | ICD-10-CM | POA: Diagnosis not present

## 2023-02-16 DIAGNOSIS — R451 Restlessness and agitation: Secondary | ICD-10-CM | POA: Diagnosis not present

## 2023-02-16 DIAGNOSIS — G40909 Epilepsy, unspecified, not intractable, without status epilepticus: Secondary | ICD-10-CM | POA: Diagnosis not present

## 2023-02-16 DIAGNOSIS — E119 Type 2 diabetes mellitus without complications: Secondary | ICD-10-CM | POA: Diagnosis not present

## 2023-02-16 DIAGNOSIS — R350 Frequency of micturition: Secondary | ICD-10-CM

## 2023-02-16 DIAGNOSIS — I10 Essential (primary) hypertension: Secondary | ICD-10-CM | POA: Diagnosis not present

## 2023-02-16 NOTE — Progress Notes (Signed)
Patient refused to be seen by provider. Patient's aid Massie Bougie states that she has something to do and she will be back. Sophia another aid [facility] contact [facility] that it was okay from the patient to return to the facility.

## 2023-02-17 ENCOUNTER — Emergency Department (HOSPITAL_COMMUNITY): Payer: Medicare HMO

## 2023-02-17 ENCOUNTER — Other Ambulatory Visit: Payer: Self-pay

## 2023-02-17 ENCOUNTER — Encounter (HOSPITAL_COMMUNITY): Payer: Self-pay

## 2023-02-17 ENCOUNTER — Inpatient Hospital Stay (HOSPITAL_COMMUNITY)
Admission: EM | Admit: 2023-02-17 | Discharge: 2023-02-27 | DRG: 100 | Disposition: A | Discharge status: Hospice | Payer: Medicare HMO | Source: Skilled Nursing Facility | Attending: Internal Medicine | Admitting: Internal Medicine

## 2023-02-17 DIAGNOSIS — R627 Adult failure to thrive: Secondary | ICD-10-CM | POA: Diagnosis present

## 2023-02-17 DIAGNOSIS — N183 Chronic kidney disease, stage 3 unspecified: Secondary | ICD-10-CM | POA: Diagnosis not present

## 2023-02-17 DIAGNOSIS — M5010 Cervical disc disorder with radiculopathy, unspecified cervical region: Secondary | ICD-10-CM | POA: Diagnosis present

## 2023-02-17 DIAGNOSIS — I959 Hypotension, unspecified: Secondary | ICD-10-CM | POA: Diagnosis not present

## 2023-02-17 DIAGNOSIS — I129 Hypertensive chronic kidney disease with stage 1 through stage 4 chronic kidney disease, or unspecified chronic kidney disease: Secondary | ICD-10-CM | POA: Diagnosis present

## 2023-02-17 DIAGNOSIS — Z79899 Other long term (current) drug therapy: Secondary | ICD-10-CM

## 2023-02-17 DIAGNOSIS — I69354 Hemiplegia and hemiparesis following cerebral infarction affecting left non-dominant side: Secondary | ICD-10-CM

## 2023-02-17 DIAGNOSIS — Z7189 Other specified counseling: Secondary | ICD-10-CM | POA: Diagnosis not present

## 2023-02-17 DIAGNOSIS — R410 Disorientation, unspecified: Secondary | ICD-10-CM | POA: Diagnosis not present

## 2023-02-17 DIAGNOSIS — K219 Gastro-esophageal reflux disease without esophagitis: Secondary | ICD-10-CM | POA: Diagnosis present

## 2023-02-17 DIAGNOSIS — E1165 Type 2 diabetes mellitus with hyperglycemia: Secondary | ICD-10-CM | POA: Diagnosis present

## 2023-02-17 DIAGNOSIS — D631 Anemia in chronic kidney disease: Secondary | ICD-10-CM | POA: Diagnosis not present

## 2023-02-17 DIAGNOSIS — Z5329 Procedure and treatment not carried out because of patient's decision for other reasons: Secondary | ICD-10-CM | POA: Diagnosis not present

## 2023-02-17 DIAGNOSIS — R456 Violent behavior: Secondary | ICD-10-CM | POA: Diagnosis not present

## 2023-02-17 DIAGNOSIS — I6523 Occlusion and stenosis of bilateral carotid arteries: Secondary | ICD-10-CM | POA: Diagnosis not present

## 2023-02-17 DIAGNOSIS — E1142 Type 2 diabetes mellitus with diabetic polyneuropathy: Secondary | ICD-10-CM | POA: Diagnosis present

## 2023-02-17 DIAGNOSIS — F319 Bipolar disorder, unspecified: Secondary | ICD-10-CM | POA: Diagnosis present

## 2023-02-17 DIAGNOSIS — G9341 Metabolic encephalopathy: Secondary | ICD-10-CM | POA: Diagnosis not present

## 2023-02-17 DIAGNOSIS — R296 Repeated falls: Secondary | ICD-10-CM | POA: Diagnosis not present

## 2023-02-17 DIAGNOSIS — Z86011 Personal history of benign neoplasm of the brain: Secondary | ICD-10-CM

## 2023-02-17 DIAGNOSIS — I69322 Dysarthria following cerebral infarction: Secondary | ICD-10-CM | POA: Diagnosis not present

## 2023-02-17 DIAGNOSIS — Z515 Encounter for palliative care: Secondary | ICD-10-CM | POA: Diagnosis not present

## 2023-02-17 DIAGNOSIS — Z7983 Long term (current) use of bisphosphonates: Secondary | ICD-10-CM

## 2023-02-17 DIAGNOSIS — H409 Unspecified glaucoma: Secondary | ICD-10-CM | POA: Diagnosis present

## 2023-02-17 DIAGNOSIS — R4020 Unspecified coma: Secondary | ICD-10-CM | POA: Diagnosis not present

## 2023-02-17 DIAGNOSIS — Z91011 Allergy to milk products: Secondary | ICD-10-CM

## 2023-02-17 DIAGNOSIS — I700 Atherosclerosis of aorta: Secondary | ICD-10-CM | POA: Diagnosis not present

## 2023-02-17 DIAGNOSIS — F419 Anxiety disorder, unspecified: Secondary | ICD-10-CM | POA: Diagnosis present

## 2023-02-17 DIAGNOSIS — R4182 Altered mental status, unspecified: Secondary | ICD-10-CM | POA: Diagnosis not present

## 2023-02-17 DIAGNOSIS — R638 Other symptoms and signs concerning food and fluid intake: Secondary | ICD-10-CM | POA: Diagnosis not present

## 2023-02-17 DIAGNOSIS — N3000 Acute cystitis without hematuria: Secondary | ICD-10-CM | POA: Diagnosis not present

## 2023-02-17 DIAGNOSIS — I10 Essential (primary) hypertension: Secondary | ICD-10-CM | POA: Diagnosis not present

## 2023-02-17 DIAGNOSIS — Z91048 Other nonmedicinal substance allergy status: Secondary | ICD-10-CM

## 2023-02-17 DIAGNOSIS — Z90710 Acquired absence of both cervix and uterus: Secondary | ICD-10-CM

## 2023-02-17 DIAGNOSIS — Z794 Long term (current) use of insulin: Secondary | ICD-10-CM | POA: Diagnosis not present

## 2023-02-17 DIAGNOSIS — Z881 Allergy status to other antibiotic agents status: Secondary | ICD-10-CM

## 2023-02-17 DIAGNOSIS — E039 Hypothyroidism, unspecified: Secondary | ICD-10-CM | POA: Diagnosis present

## 2023-02-17 DIAGNOSIS — E876 Hypokalemia: Secondary | ICD-10-CM | POA: Diagnosis present

## 2023-02-17 DIAGNOSIS — R54 Age-related physical debility: Secondary | ICD-10-CM | POA: Diagnosis not present

## 2023-02-17 DIAGNOSIS — I6782 Cerebral ischemia: Secondary | ICD-10-CM | POA: Diagnosis not present

## 2023-02-17 DIAGNOSIS — G40909 Epilepsy, unspecified, not intractable, without status epilepticus: Secondary | ICD-10-CM | POA: Diagnosis not present

## 2023-02-17 DIAGNOSIS — E66811 Obesity, class 1: Secondary | ICD-10-CM | POA: Diagnosis present

## 2023-02-17 DIAGNOSIS — Z7982 Long term (current) use of aspirin: Secondary | ICD-10-CM

## 2023-02-17 DIAGNOSIS — Z6831 Body mass index (BMI) 31.0-31.9, adult: Secondary | ICD-10-CM | POA: Diagnosis not present

## 2023-02-17 DIAGNOSIS — Z711 Person with feared health complaint in whom no diagnosis is made: Secondary | ICD-10-CM | POA: Diagnosis not present

## 2023-02-17 DIAGNOSIS — Z66 Do not resuscitate: Secondary | ICD-10-CM | POA: Diagnosis not present

## 2023-02-17 DIAGNOSIS — Z96612 Presence of left artificial shoulder joint: Secondary | ICD-10-CM | POA: Diagnosis present

## 2023-02-17 DIAGNOSIS — N390 Urinary tract infection, site not specified: Secondary | ICD-10-CM | POA: Diagnosis present

## 2023-02-17 DIAGNOSIS — Z8249 Family history of ischemic heart disease and other diseases of the circulatory system: Secondary | ICD-10-CM

## 2023-02-17 DIAGNOSIS — Z888 Allergy status to other drugs, medicaments and biological substances status: Secondary | ICD-10-CM

## 2023-02-17 DIAGNOSIS — R569 Unspecified convulsions: Secondary | ICD-10-CM

## 2023-02-17 DIAGNOSIS — Z9049 Acquired absence of other specified parts of digestive tract: Secondary | ICD-10-CM

## 2023-02-17 DIAGNOSIS — I5032 Chronic diastolic (congestive) heart failure: Secondary | ICD-10-CM | POA: Diagnosis present

## 2023-02-17 DIAGNOSIS — N189 Chronic kidney disease, unspecified: Secondary | ICD-10-CM | POA: Diagnosis present

## 2023-02-17 DIAGNOSIS — R404 Transient alteration of awareness: Secondary | ICD-10-CM | POA: Diagnosis not present

## 2023-02-17 DIAGNOSIS — E785 Hyperlipidemia, unspecified: Secondary | ICD-10-CM | POA: Diagnosis present

## 2023-02-17 DIAGNOSIS — G4733 Obstructive sleep apnea (adult) (pediatric): Secondary | ICD-10-CM | POA: Diagnosis present

## 2023-02-17 DIAGNOSIS — Z823 Family history of stroke: Secondary | ICD-10-CM

## 2023-02-17 DIAGNOSIS — G4700 Insomnia, unspecified: Secondary | ICD-10-CM | POA: Diagnosis present

## 2023-02-17 DIAGNOSIS — Z789 Other specified health status: Secondary | ICD-10-CM | POA: Diagnosis not present

## 2023-02-17 DIAGNOSIS — K5904 Chronic idiopathic constipation: Secondary | ICD-10-CM | POA: Diagnosis present

## 2023-02-17 DIAGNOSIS — Z8673 Personal history of transient ischemic attack (TIA), and cerebral infarction without residual deficits: Secondary | ICD-10-CM | POA: Diagnosis not present

## 2023-02-17 DIAGNOSIS — Z880 Allergy status to penicillin: Secondary | ICD-10-CM

## 2023-02-17 DIAGNOSIS — I639 Cerebral infarction, unspecified: Secondary | ICD-10-CM | POA: Diagnosis not present

## 2023-02-17 DIAGNOSIS — Z833 Family history of diabetes mellitus: Secondary | ICD-10-CM

## 2023-02-17 DIAGNOSIS — Z860101 Personal history of adenomatous and serrated colon polyps: Secondary | ICD-10-CM

## 2023-02-17 DIAGNOSIS — F17210 Nicotine dependence, cigarettes, uncomplicated: Secondary | ICD-10-CM | POA: Diagnosis present

## 2023-02-17 DIAGNOSIS — E1122 Type 2 diabetes mellitus with diabetic chronic kidney disease: Secondary | ICD-10-CM | POA: Diagnosis not present

## 2023-02-17 DIAGNOSIS — G894 Chronic pain syndrome: Secondary | ICD-10-CM | POA: Diagnosis present

## 2023-02-17 DIAGNOSIS — M797 Fibromyalgia: Secondary | ICD-10-CM | POA: Diagnosis present

## 2023-02-17 DIAGNOSIS — E042 Nontoxic multinodular goiter: Secondary | ICD-10-CM | POA: Diagnosis not present

## 2023-02-17 DIAGNOSIS — Z79890 Hormone replacement therapy (postmenopausal): Secondary | ICD-10-CM

## 2023-02-17 DIAGNOSIS — G9389 Other specified disorders of brain: Secondary | ICD-10-CM | POA: Diagnosis not present

## 2023-02-17 DIAGNOSIS — Z7401 Bed confinement status: Secondary | ICD-10-CM | POA: Diagnosis not present

## 2023-02-17 DIAGNOSIS — I509 Heart failure, unspecified: Secondary | ICD-10-CM

## 2023-02-17 DIAGNOSIS — J449 Chronic obstructive pulmonary disease, unspecified: Secondary | ICD-10-CM | POA: Diagnosis present

## 2023-02-17 DIAGNOSIS — D320 Benign neoplasm of cerebral meninges: Secondary | ICD-10-CM | POA: Diagnosis present

## 2023-02-17 LAB — COMPREHENSIVE METABOLIC PANEL
ALT: 14 U/L (ref 0–44)
AST: 19 U/L (ref 15–41)
Albumin: 4.2 g/dL (ref 3.5–5.0)
Alkaline Phosphatase: 70 U/L (ref 38–126)
Anion gap: 9 (ref 5–15)
BUN: 16 mg/dL (ref 8–23)
CO2: 27 mmol/L (ref 22–32)
Calcium: 9.5 mg/dL (ref 8.9–10.3)
Chloride: 106 mmol/L (ref 98–111)
Creatinine, Ser: 1.3 mg/dL — ABNORMAL HIGH (ref 0.44–1.00)
GFR, Estimated: 44 mL/min — ABNORMAL LOW (ref >60)
Glucose, Bld: 108 mg/dL — ABNORMAL HIGH (ref 70–99)
Potassium: 3.4 mmol/L — ABNORMAL LOW (ref 3.5–5.1)
Sodium: 142 mmol/L (ref 135–145)
Total Bilirubin: 0.5 mg/dL (ref <1.2)
Total Protein: 7.5 g/dL (ref 6.5–8.1)

## 2023-02-17 LAB — CBC WITH DIFFERENTIAL/PLATELET
Abs Immature Granulocytes: 0.02 10*3/uL (ref 0.00–0.07)
Basophils Absolute: 0 10*3/uL (ref 0.0–0.1)
Basophils Relative: 0 %
Eosinophils Absolute: 0.1 10*3/uL (ref 0.0–0.5)
Eosinophils Relative: 2 %
HCT: 37.2 % (ref 36.0–46.0)
Hemoglobin: 10.3 g/dL — ABNORMAL LOW (ref 12.0–15.0)
Immature Granulocytes: 0 %
Lymphocytes Relative: 26 %
Lymphs Abs: 1.9 10*3/uL (ref 0.7–4.0)
MCH: 21.6 pg — ABNORMAL LOW (ref 26.0–34.0)
MCHC: 27.7 g/dL — ABNORMAL LOW (ref 30.0–36.0)
MCV: 78 fL — ABNORMAL LOW (ref 80.0–100.0)
Monocytes Absolute: 0.9 10*3/uL (ref 0.1–1.0)
Monocytes Relative: 12 %
Neutro Abs: 4.6 10*3/uL (ref 1.7–7.7)
Neutrophils Relative %: 60 %
Platelets: 251 10*3/uL (ref 150–400)
RBC: 4.77 MIL/uL (ref 3.87–5.11)
RDW: 16.6 % — ABNORMAL HIGH (ref 11.5–15.5)
WBC: 7.6 10*3/uL (ref 4.0–10.5)
nRBC: 0 % (ref 0.0–0.2)

## 2023-02-17 LAB — TYPE AND SCREEN
ABO/RH(D): O POS
Antibody Screen: NEGATIVE

## 2023-02-17 LAB — APTT: aPTT: 26 s (ref 24–36)

## 2023-02-17 LAB — PROTIME-INR
INR: 1.1 (ref 0.8–1.2)
Prothrombin Time: 14 s (ref 11.4–15.2)

## 2023-02-17 LAB — CBG MONITORING, ED: Glucose-Capillary: 89 mg/dL (ref 70–99)

## 2023-02-17 LAB — ETHANOL: Alcohol, Ethyl (B): <10 mg/dL (ref <10)

## 2023-02-17 MED ORDER — IOHEXOL 350 MG/ML SOLN
60.0000 mL | Freq: Once | INTRAVENOUS | Status: AC | PRN
  Administered 2023-02-17: 60 mL via INTRAVENOUS

## 2023-02-17 MED ORDER — SODIUM CHLORIDE 0.9 % IV SOLN: INTRAVENOUS | Status: AC

## 2023-02-17 MED ORDER — LEVETIRACETAM IN NACL 500 MG/100ML IV SOLN
500.0000 mg | Freq: Two times a day (BID) | INTRAVENOUS | Status: DC
Start: 2023-02-18 — End: 2023-02-19
  Administered 2023-02-18 – 2023-02-19 (×3): 500 mg via INTRAVENOUS
  Filled 2023-02-17 (×3): qty 100

## 2023-02-17 MED ORDER — LORAZEPAM 2 MG/ML IJ SOLN
2.0000 mg | Freq: Once | INTRAMUSCULAR | Status: AC
  Administered 2023-02-17: 2 mg via INTRAVENOUS

## 2023-02-17 MED ORDER — CYCLOSPORINE 0.05 % OP EMUL
1.0000 [drp] | Freq: Every day | OPHTHALMIC | Status: DC
  Administered 2023-02-18 – 2023-02-27 (×7): 1 [drp] via OPHTHALMIC
  Filled 2023-02-17 (×12): qty 30

## 2023-02-17 MED ORDER — ACETAMINOPHEN 160 MG/5ML PO SOLN: 650.0000 mg | ORAL | Status: DC | PRN

## 2023-02-17 MED ORDER — SENNOSIDES-DOCUSATE SODIUM 8.6-50 MG PO TABS: 1.0000 | ORAL_TABLET | Freq: Every evening | ORAL | Status: DC | PRN

## 2023-02-17 MED ORDER — STROKE: EARLY STAGES OF RECOVERY BOOK
Freq: Once | Status: DC
  Filled 2023-02-17: qty 1

## 2023-02-17 MED ORDER — ACETAMINOPHEN 325 MG PO TABS
650.0000 mg | ORAL_TABLET | ORAL | Status: DC | PRN
  Administered 2023-02-20: 650 mg via ORAL
  Filled 2023-02-17 (×2): qty 2

## 2023-02-17 MED ORDER — LORAZEPAM 2 MG/ML IJ SOLN
INTRAMUSCULAR | Status: AC
  Filled 2023-02-17: qty 1

## 2023-02-17 MED ORDER — ASPIRIN 300 MG RE SUPP
300.0000 mg | Freq: Once | RECTAL | Status: AC
  Administered 2023-02-17: 300 mg via RECTAL
  Filled 2023-02-17: qty 1

## 2023-02-17 MED ORDER — LEVETIRACETAM IN NACL 1500 MG/100ML IV SOLN
1500.0000 mg | Freq: Once | INTRAVENOUS | Status: AC
  Administered 2023-02-17: 1500 mg via INTRAVENOUS
  Filled 2023-02-17: qty 100

## 2023-02-17 MED ORDER — ACETAMINOPHEN 650 MG RE SUPP: 650.0000 mg | RECTAL | Status: DC | PRN

## 2023-02-17 MED ORDER — HEPARIN SODIUM (PORCINE) 5000 UNIT/ML IJ SOLN
5000.0000 [IU] | Freq: Three times a day (TID) | INTRAMUSCULAR | Status: DC
  Administered 2023-02-17 – 2023-02-23 (×16): 5000 [IU] via SUBCUTANEOUS
  Filled 2023-02-17 (×17): qty 1

## 2023-02-17 NOTE — ED Triage Notes (Signed)
BIB EMS from [facility] said pt fell last night and has been combative for the last 10 minutes prior to EMS call.

## 2023-02-17 NOTE — Consult Note (Signed)
NEUROLOGY TELECONSULTATION NOTE   Date of service: February 17, 2023 Patient Name: [name] MRN:  [id number] DOB:  12-01-1950 Reason for consult: telestroke  Requesting Provider: Dr. Valeria Batman Consult Participants: myself, patient, bedside RN, telestroke RN Location of the provider: [facility], [place] Location of the patient: [facility]  This consult was provided via telemedicine with 2-way video and audio communication. The patient/family was informed that care would be provided in this way and agreed to receive care in this manner.   _ _ _   _ __   _ __ _ _  __ __   _ __   __ _  History of Present Illness   This is a 72 year old woman with a history of CKD, bipolar disorder, bifrontal meningioma status postresection, COPD, hyperlipidemia, hypertension, hypothyroidism, seizure disorder who is brought in by EMS from facility for agitation.  She was last seen by the neurology service 1 month ago at home when she was seen for altered mental status which was favored to be due to polypharmacy.  Per EMS patient's last known well was 4:00PM today when she had her blood sugar checked. At baseline she is typically oriented x2 and ambulates with a walker or cane.  I called the facility 4 times and was unable to reach anyone to independently verify her last known well.  Per charge nurse report he was able to speak with somebody at the facility earlier this evening she has fallen 3 times in the past 24 hours.  She typically falls relatively frequently although not this often.  Head CT showed bifrontal encephalomalacia consistent with her known prior resection of meningioma.  On my examination she is nonverbal other than stating no, will not follow commands, withdraws less to pain on her right side than her left side.  I discussed her presentation with son Denyse Amass over the phone.  I explained that I did not feel comfortable giving her a clot busting medication knowing that she had fallen 3 times in the past 24 hours,  unable to obtain history from the patient, and unable to reach anyone at the facility to verify the last known well.  He was in agreement with this knowing the risk of hemorrhage if TNK is administered outside the window.  CTA was performed and that did not show a large vessel occlusion.  2 mg of IV Ativan was administered and patient became more somnolent but her exam did not improve.   ROS   UTA 2/2 mental status  Past History   The following was personally reviewed:  Past Medical History:  Diagnosis Date   Allergy    Anemia    Anemia in chronic kidney disease (CKD) 08/10/2021   Anemia in chronic kidney disease (CKD) 10/12/2021   Anxiety    takes Ativan daily   Arthritis    Assistance needed for mobility    Bipolar disorder (HCC)    takes Risperdal nightly   Blood transfusion    Brain tumor (HCC)    Cancer (HCC)    In her gum   Carpal tunnel syndrome of right wrist 05/23/2011   Cervical disc disorder with radiculopathy of cervical region 10/31/2012   Chronic back pain    Chronic idiopathic constipation    Chronic neck and back pain    Colon polyps    COPD (chronic obstructive pulmonary disease) with chronic bronchitis (HCC) 09/16/2013   Office Spirometry 10/30/2013-submaximal effort based on appearance of loop and curve. Numbers would fit with severe  restriction but her physiologic capability may be better than this. FVC 0.91/44%, and 10.74/45%, FEV1/FVC 0.81, FEF 25-75% 1.43/69%     Diabetes mellitus    Type II   Diverticulosis    TCS 9/08 by Dr. Lina Sar for diarrhea . Bx for micro scopic colitis negative.    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   Metabolic encephalopathy 08/03/2011   Migraines    chronic headaches   Mononeuritis lower limb    Narcolepsy    Osteoporosis    Pancreatitis 2006   due to Depakote with normal EUS    Paralysis (HCC)    Pneumonia    Schatzki's ring    non critical / EGD with ED 8/2011with RMR   Seizures (HCC)    takes Lamictal daily.Last seizure 3 yrs ago   Sleep apnea    on CPAP   Small bowel obstruction (HCC)    Stroke (HCC)    left sided weakness, speech changes   Tubular adenoma of colon    Past Surgical History:  Procedure Laterality Date   ABDOMINAL HYSTERECTOMY  1978   BACK SURGERY  July 2012   BACTERIAL OVERGROWTH TEST N/A 05/05/2013   Procedure: BACTERIAL OVERGROWTH TEST;  Surgeon: Corbin Ade, MD;  Location: [facility];  Service: Endoscopy;  Laterality: N/A;  7:30   BIOPSY THYROID  2009   BRAIN SURGERY  11/2011   resection of meningioma   BREAST REDUCTION SURGERY  1994   CARDIAC CATHETERIZATION  05/10/2005   normal coronaries, normal LV systolic function and EF (Dr. Evlyn Courier)   CARPAL TUNNEL RELEASE Left 07/22/04   Dr. Romeo Apple   CATARACT EXTRACTION Bilateral    CHOLECYSTECTOMY  1984   COLONOSCOPY N/A 09/25/2012   AOZ:HYQMVHQ diverticulosis.  colonic polyp-removed : tubular adenoma   CRANIOTOMY  11/23/2011   Procedure: CRANIOTOMY TUMOR EXCISION;  Surgeon: Hewitt Shorts, MD;  Location: [facility];  Service: Neurosurgery;  Laterality: N/A;  Craniotomy for tumor resection   ESOPHAGOGASTRODUODENOSCOPY  12/29/2010   Rourk-Retained food in the esophagus and stomach, small hiatal hernia, status post Maloney dilation of the esophagus   ESOPHAGOGASTRODUODENOSCOPY N/A 09/25/2012   ION:GEXBMWUX atonic baggy esophagus status post Maloney dilation 56 F. Hiatal hernia   GIVENS CAPSULE STUDY N/A 01/15/2013   NORMAL.     IR GENERIC HISTORICAL  03/17/2016   IR RADIOLOGIST EVAL & MGMT 03/17/2016 [facility]   LESION REMOVAL N/A 05/31/2015   Procedure: REMOVAL RIGHT AND LEFT LESIONS OF MANDIBLE;  Surgeon: Ocie Doyne, DDS;  Location: [facility];  Service: Oral Surgery;  Laterality: N/A;   MALONEY DILATION  12/29/2010   RMR;   NM MYOCAR PERF WALL MOTION  2006   "relavtiely normal" persantine, mild anterior thinning (breast attenuation artifact), no region of scar/ischemia   OVARIAN CYST REMOVAL     RECTOCELE REPAIR N/A 06/29/2015   Procedure: POSTERIOR REPAIR (RECTOCELE);  Surgeon: Tilda Burrow, MD;  Location: [facility];  Service: Gynecology;  Laterality: N/A;   REDUCTION MAMMAPLASTY Bilateral    REVERSE SHOULDER ARTHROPLASTY Left 08/18/2022   Procedure: REVERSE SHOULDER ARTHROPLASTY;  Surgeon: Beverely Low, MD;  Location: [facility];  Service: Orthopedics;  Laterality: Left;  choice with interscalene, general   SPINE SURGERY  09/29/2010   Dr. Shon Baton   surgical excision of 3 tumors from right thigh and right buttock  and left upper thigh  2010   TOOTH EXTRACTION Bilateral 12/14/2014   Procedure: REMOVAL OF BILATERAL MANDIBULAR EXOSTOSES;  Surgeon: Ocie Doyne, DDS;  Location: [facility];  Service: Oral Surgery;  Laterality: Bilateral;   TRANSTHORACIC ECHOCARDIOGRAM  2010   EF 60-65%, mild conc LVH, grade 1 diastolic dysfunction; mildly calcified MV annulus with mildly thickened leaflets, mildly calcified MR annulus   Family History  Problem Relation Age of Onset   Heart attack Mother        HTN   Pneumonia Father    Kidney failure Father    Diabetes Father    Pancreatic cancer Sister    Cancer Sister        breast    Cancer Sister        pancreatic   Diabetes Brother    Hypertension Brother    Diabetes Brother    Alcohol abuse Maternal Uncle    Stroke Maternal Grandmother    Heart attack Maternal Grandfather    Hypertension Son    Sleep apnea Son    Colon cancer Neg Hx    Anesthesia problems Neg Hx     Hypotension Neg Hx    Malignant hyperthermia Neg Hx    Pseudochol deficiency Neg Hx    Breast cancer Neg Hx    Stomach cancer Neg Hx    Social History   Socioeconomic History   Marital status: Legally Separated    Spouse name: Not on file   Number of children: 1   Years of education: 12   Highest education level: High school graduate  Occupational History   Occupation: Disabled  Tobacco Use   Smoking status: Some Days    Current packs/day: 0.00    Average packs/day: 0.5 packs/day for 47.0 years (23.5 ttl pk-yrs)    Types: Cigarettes    Start date: 07/06/1974    Last attempt to quit: 07/05/2021    Years since quitting: 1.6    Passive exposure: Past   Smokeless tobacco: Never   Tobacco comments:    3-4 a day   Vaping Use   Vaping status: Never Used  Substance and Sexual Activity   Alcohol use: No    Alcohol/week: 0.0 standard drinks of alcohol    Comment:     Drug use: No   Sexual activity: Not Currently  Other Topics Concern   Not on file  Social History Narrative   01/29/18 Lives alone, has 3 aides, Mon- Fri 8 hrs, 2 hrs on Sat-Sun, RN manages her meds   Caffeine use: Drink coffee sometimes    Right handed    Social Determinants of Health   Financial Resource Strain: Low Risk  (07/17/2022)   Overall Financial Resource Strain (CARDIA)    Difficulty of Paying Living Expenses: Not very hard  Food Insecurity: No Food Insecurity (01/12/2023)   Hunger Vital Sign    Worried About Running Out of Food in the Last Year: Never true    Ran Out of Food in the Last Year: Never true  Transportation Needs:  No Transportation Needs (01/12/2023)   PRAPARE - Administrator, Civil Service (Medical): No    Lack of Transportation (Non-Medical): No  Physical Activity: Insufficiently Active (07/17/2022)   Exercise Vital Sign    Days of Exercise per Week: 2 days    Minutes of Exercise per Session: 20 min  Stress: No Stress Concern Present (07/17/2022)   [employer] of  Occupational Health - Occupational Stress Questionnaire    Feeling of Stress : Not at all  Social Connections: Moderately Integrated (07/17/2022)   Social Connection and Isolation Panel [NHANES]    Frequency of Communication with Friends and Family: Three times a week    Frequency of Social Gatherings with Friends and Family: Once a week    Attends Religious Services: More than 4 times per year    Active Member of [employer] or Organizations: Yes    Attends Engineer, structural: More than 4 times per year    Marital Status: Divorced   Allergies  Allergen Reactions   Iron Itching and Nausea And Vomiting   Milk (Cow) Rash and Other (See Comments)    Doesn't agree with the stomach, also    Penicillins Hives   Phenazopyridine Hives   Cephalexin Hives   Flonase [Fluticasone] Other (See Comments)    "It gave me ulcers in my nose"   Milk-Related Compounds Nausea Only and Other (See Comments)    Doesn't agree with the stomach    Medications   (Not in a hospital admission)     Current Facility-Administered Medications:    levETIRAcetam (KEPPRA) IVPB 1500 mg/ 100 mL premix, 1,500 mg, Intravenous, Once, Last Rate: 400 mL/hr at 02/17/23 1954, 1,500 mg at 02/17/23 1954 **FOLLOWED BY** [START ON 02/18/2023] levETIRAcetam (KEPPRA) IVPB 500 mg/100 mL premix, 500 mg, Intravenous, Q12H, Jefferson Fuel, MD  Current Outpatient Medications:    acetaminophen (TYLENOL) 325 MG tablet, Take 650 mg by mouth every 6 (six) hours as needed for mild pain, moderate pain or headache., Disp: , Rfl:    alendronate (FOSAMAX) 70 MG tablet, TAKE 1 TABLET EVERY 7 DAYS ON AN EMPTY STOMACH WITH A FULL GLASS OF WATER (Patient taking differently: Take 70 mg by mouth once a week.), Disp: 12 tablet, Rfl: 3   amLODipine (NORVASC) 5 MG tablet, Take 1 tablet (5 mg total) by mouth daily., Disp: , Rfl:    aspirin EC 81 MG tablet, Take 1 tablet (81 mg total) by mouth daily with breakfast., Disp: 120 tablet, Rfl: 2   Blood  Glucose Calibration (ACCU-CHEK GUIDE CONTROL) LIQD, USE AS DIRECTED, Disp: 1 each, Rfl: 0   blood glucose meter kit and supplies, Dispense based on patient and insurance preference. Use up to four times daily as directed. (FOR ICD-10 E10.9, E11.9)., Disp: 1 each, Rfl: 0   carvedilol (COREG) 12.5 MG tablet, Take 12.5 mg by mouth 2 (two) times daily with a meal., Disp: , Rfl:    Cholecalciferol (D3 PO), Take 1 tablet by mouth daily., Disp: , Rfl:    Continuous Blood Gluc Sensor (FREESTYLE LIBRE 14 DAY SENSOR) MISC, 1 each by Does not apply route every 14 (fourteen) days. Change every 2 weeks, Disp: 2 each, Rfl: 11   Docusate Sodium (DSS) 100 MG CAPS, Take 100 mg by mouth in the morning and at bedtime., Disp: , Rfl:    DROPLET PEN NEEDLES 31G X 8 MM MISC, USE FOR INJECTING INSULIN 4 TIMES DAILY., Disp: 400 each, Rfl: 0   Elastic Bandages &  Supports (ADJUSTABLE ARM SLING) MISC, L arm sling, Disp: 1 each, Rfl: 0   ezetimibe (ZETIA) 10 MG tablet, TAKE 1 TABLET EVERY DAY, Disp: 90 tablet, Rfl: 10   glucose blood (ACCU-CHEK GUIDE) test strip, Use as instructed, Disp: 400 strip, Rfl: 3   insulin aspart (NOVOLOG) 100 UNIT/ML injection, Inject 0-5 Units into the skin at bedtime., Disp: , Rfl:    insulin aspart (NOVOLOG) 100 UNIT/ML injection, Inject 0-15 Units into the skin 3 (three) times daily with meals., Disp: , Rfl:    insulin glargine, 2 Unit Dial, (TOUJEO MAX SOLOSTAR) 300 UNIT/ML Solostar Pen, INJECT 20-26 UNITS INTO THE SKIN DAILY., Disp: 12 mL, Rfl: 1   insulin glargine-yfgn (SEMGLEE) 100 UNIT/ML injection, Inject 0.1 mLs (10 Units total) into the skin at bedtime., Disp: , Rfl:    ipratropium-albuterol (DUONEB) 0.5-2.5 (3) MG/3ML SOLN, Take 3 mLs by nebulization every 6 (six) hours as needed., Disp: 360 mL, Rfl: 12   ketoconazole (NIZORAL) 2 % shampoo, Apply 1 Application topically 2 (two) times a week., Disp: 120 mL, Rfl: 0   lamoTRIgine (LAMICTAL) 150 MG tablet, Take 1 tablet (150 mg total) by mouth  2 (two) times daily., Disp: 60 tablet, Rfl: 0   levothyroxine (SYNTHROID) 50 MCG tablet, TAKE 1 TABLET DAILY SIX DAYS A WEEK AND 1/2 TABLET ON ONE DAY A WEEK (Patient taking differently: Take 50 mcg by mouth See admin instructions. Take 50 mg by mouth for 6 days a week and 25 mg on one day a week), Disp: 85 tablet, Rfl: 2   melatonin 3 MG TABS tablet, Take 1 tablet (3 mg total) by mouth at bedtime as needed., Disp: , Rfl:    mirabegron ER (MYRBETRIQ) 50 MG TB24 tablet, Take 1 tablet (50 mg total) by mouth daily., Disp: 30 tablet, Rfl: 5   Misc. Devices (MATTRESS PAD) MISC, FIRM MATTRESS PAD for hospital bed x 1, Disp: 1 each, Rfl: 0   montelukast (SINGULAIR) 10 MG tablet, Take 10 mg by mouth daily., Disp: , Rfl:    Multiple Vitamins-Minerals (MULTIVITAMIN GUMMIES ADULT PO), Take 1 tablet by mouth daily., Disp: , Rfl:    ondansetron (ZOFRAN) 4 MG tablet, Take one tablet by mouth once daily, as needed , for nausea, Disp: 30 tablet, Rfl: 2   polyethylene glycol (MIRALAX / GLYCOLAX) 17 g packet, Take 17 g by mouth daily as needed., Disp: , Rfl:    pregabalin (LYRICA) 75 MG capsule, Take 1 capsule (75 mg total) by mouth daily., Disp: 30 capsule, Rfl: 0   RABEprazole (ACIPHEX) 20 MG tablet, TAKE 1 TABLET TWICE DAILY, Disp: 180 tablet, Rfl: 0   RESTASIS 0.05 % ophthalmic emulsion, Place 2 drops into both eyes daily., Disp: , Rfl:    rosuvastatin (CRESTOR) 5 MG tablet, TAKE 1 TABLET AT BEDTIME, Disp: 90 tablet, Rfl: 3   senna-docusate (SENOKOT-S) 8.6-50 MG tablet, Take 1 tablet by mouth 2 (two) times daily., Disp: , Rfl:    sertraline (ZOLOFT) 100 MG tablet, Take 1 tablet (100 mg total) by mouth every morning., Disp: 30 tablet, Rfl: 0   UNABLE TO FIND, Med Name:  G5 Mattress, Disp: 1 each, Rfl: 0   vitamin E 180 MG (400 UNITS) capsule, Take 400 Units by mouth daily., Disp: , Rfl:   Vitals   Vitals:   02/17/23 1835 02/17/23 1837 02/17/23 1915 02/17/23 1930  BP: (!) 159/63   126/64  Pulse: 80  82 89   Resp: (!) 21  (!) 22 19  Temp:      TempSrc:      SpO2: 93%  94% 98%  Weight:  70 kg    Height:  4\' 11"  (1.499 m)       Body mass index is 31.17 kg/m.  Physical Exam   Exam performed over telemedicine with 2-way video and audio communication and with assistance of bedside RN  Physical Exam Gen: somnolent, unable to answer orientation questions, does not follow commands Resp: normal WOB CV: extremities appear well-perfused  Neuro: *MS: somnolent, unable to answer orientation questions, does not follow commands *Speech: mild dysarthria when stating no, otherwise aphasic *CN: PERRL 3mm, EOMI, blinks to threat bilat, sensation intact, face symmetric at rest, hearing intact to voice *Motor & sensory: No movement against gravity in any extremity, moves R side less to noxious stimuli than L side *Coordination:  UTA *Reflexes:  UTA 2/2 tele-exam *Gait: deferred  NIHSS  1a Level of Conscious.: 2 1b LOC Questions: 2 1c LOC Commands: 2 2 Best Gaze: 0 3 Visual: 0 4 Facial Palsy: 0 5a Motor Arm - left: 3 5b Motor Arm - Right: 3 6a Motor Leg - Left: 3 6b Motor Leg - Right: 3 7 Limb Ataxia: 0 8 Sensory: 1 9 Best Language: 3 10 Dysarthria: 1 11 Extinct. and Inatten.: 0  TOTAL: 23   Premorbid mRS = 3   Labs   CBC:  Recent Labs  Lab 02/17/23 1837  WBC 7.6  NEUTROABS 4.6  HGB 10.3*  HCT 37.2  MCV 78.0*  PLT 251    Basic Metabolic Panel:  Lab Results  Component Value Date   NA 142 02/17/2023   K 3.4 (L) 02/17/2023   CO2 27 02/17/2023   GLUCOSE 108 (H) 02/17/2023   BUN 16 02/17/2023   CREATININE 1.30 (H) 02/17/2023   CALCIUM 9.5 02/17/2023   GFRNONAA 44 (L) 02/17/2023   GFRAA 72 12/11/2019   Lipid Panel:  Lab Results  Component Value Date   LDLCALC 53 06/29/2022   HgbA1c:  Lab Results  Component Value Date   HGBA1C 5.8 (H) 01/13/2023   Urine Drug Screen:     Component Value Date/Time   LABOPIA NONE DETECTED 01/15/2023 1215   COCAINSCRNUR NONE  DETECTED 01/15/2023 1215   LABBENZ NONE DETECTED 01/15/2023 1215   AMPHETMU NONE DETECTED 01/15/2023 1215   THCU NONE DETECTED 01/15/2023 1215   LABBARB NONE DETECTED 01/15/2023 1215    Alcohol Level     Component Value Date/Time   ETH <10 02/17/2023 1837    CT Head without contrast: No acute intracranial process, postsurgical encephalomalacia along the medial frontal lobes bilaterally, personal review  CTA H&N No LVO on personal review and discussion with neuroradiology  Impression   This is a 72 year old woman with a history of CKD, bipolar disorder, bifrontal meningioma status postresection, COPD, hyperlipidemia, hypertension, hypothyroidism, seizure disorder who is brought in by EMS from facility for agitation. Head CT showed bifrontal encephalomalacia consistent with her known prior resection of meningioma.  On my examination she is nonverbal other than stating no, will not follow commands, withdraws less to pain on her right side than her left side.  I discussed her presentation with son Denyse Amass over the phone.  I explained that I did not feel comfortable giving her a thrombolytic knowing that she had fallen 3 times in the past 24 hours, unable to obtain history from the patient, and unable to reach anyone at the facility to verify the last known well.  He was  in agreement with this knowing the risk of hemorrhage if TNK is administered outside the window.  CTA was performed and that did not show a large vessel occlusion.  2 mg of IV Ativan was administered and patient became more somnolent but her exam did not improve. Concern for nonconvulsive seizures or possibly acute ischemic infarct.  Recommendations   - Transfer to [facility] for cEEG on hospitalist service - cEEG with MRI-compatible leads, MRI brain wo contrast when able - Loaded with 20mg /kg keppra continue 500mg  bid - Restart lamotrigine when she is able to take po. Lyrica held last admission 2/2 c/f polypharmacy, will need to check  MAR to see if she has been getting it since then at facility - ASA 300mg  PR now - Permissive HTN up to 220/120 until MRI brain confirms no stroke - NPO until swallow screen performed when she is more alert - q2 neurochecks until MRI confirms no stroke - Neurology will continue to follow at [facility] ______________________________________________________________________   Thank you for the opportunity to take part in the care of this patient. If you have any further questions, please contact the neurology consultation attending.  Signed,  Bing Neighbors, MD [facility] [phone]  If 7pm- 7am, please [facility] on call as listed in [facility].  **Any copied and pasted documentation in this note was written by me in another application not billed for and pasted by me into this document.

## 2023-02-17 NOTE — ED Provider Notes (Signed)
[place] EMERGENCY DEPARTMENT AT [facility] Provider Note   CSN: [id number] Arrival date & time: 02/17/23  1824     History {Add pertinent medical, surgical, social history, OB history to HPI:1} Chief Complaint  Patient presents with   Code Stroke    [name] is a 72 y.o. female.  Patient has a history of a stroke with left-sided weakness along with hypertension and diabetes and seizures.  According to the nursing home she started acting confused and throwing things this evening.  Her last seen normal was 4 PM.  A code stroke was called   Altered Mental Status      Home Medications Prior to Admission medications   Medication Sig Start Date End Date Taking? Authorizing Provider  acetaminophen (TYLENOL) 325 MG tablet Take 650 mg by mouth every 6 (six) hours as needed for mild pain, moderate pain or headache.    [provider]  alendronate (FOSAMAX) 70 MG tablet TAKE 1 TABLET EVERY 7 DAYS ON AN EMPTY STOMACH WITH A FULL GLASS OF WATER Patient taking differently: Take 70 mg by mouth once a week. 11/07/21   Kerri Perches, MD  amLODipine (NORVASC) 5 MG tablet Take 1 tablet (5 mg total) by mouth daily. 01/17/23   Catarina Hartshorn, MD  aspirin EC 81 MG tablet Take 1 tablet (81 mg total) by mouth daily with breakfast. 12/08/18   Shon Hale, MD  Blood Glucose Calibration (ACCU-CHEK GUIDE CONTROL) LIQD USE AS DIRECTED 12/13/20   Carlus Pavlov, MD  blood glucose meter kit and supplies Dispense based on patient and insurance preference. Use up to four times daily as directed. (FOR ICD-10 E10.9, E11.9). 06/27/19   Carlus Pavlov, MD  carvedilol (COREG) 12.5 MG tablet Take 12.5 mg by mouth 2 (two) times daily with a meal.    [provider]  Cholecalciferol (D3 PO) Take 1 tablet by mouth daily.    [provider]  Continuous Blood Gluc Sensor (FREESTYLE LIBRE 14 DAY SENSOR) MISC 1 each by Does not apply route every 14 (fourteen) days. Change  every 2 weeks 06/24/19   Carlus Pavlov, MD  Docusate Sodium (DSS) 100 MG CAPS Take 100 mg by mouth in the morning and at bedtime.    [provider]  DROPLET PEN NEEDLES 31G X 8 MM MISC USE FOR INJECTING INSULIN 4 TIMES DAILY. 09/02/21   Carlus Pavlov, MD  Elastic Bandages & Supports (ADJUSTABLE ARM SLING) MISC L arm sling 05/04/22   Kerri Perches, MD  ezetimibe (ZETIA) 10 MG tablet TAKE 1 TABLET EVERY DAY 01/19/22   Kerri Perches, MD  glucose blood (ACCU-CHEK GUIDE) test strip Use as instructed 12/12/22   Carlus Pavlov, MD  insulin aspart (NOVOLOG) 100 UNIT/ML injection Inject 0-5 Units into the skin at bedtime. 01/26/23   Dahal, Melina Schools, MD  insulin aspart (NOVOLOG) 100 UNIT/ML injection Inject 0-15 Units into the skin 3 (three) times daily with meals. 01/26/23   Dahal, Melina Schools, MD  insulin glargine, 2 Unit Dial, (TOUJEO MAX SOLOSTAR) 300 UNIT/ML Solostar Pen INJECT 20-26 UNITS INTO THE SKIN DAILY. 02/13/23   Carlus Pavlov, MD  insulin glargine-yfgn (SEMGLEE) 100 UNIT/ML injection Inject 0.1 mLs (10 Units total) into the skin at bedtime. 01/26/23   Dahal, Melina Schools, MD  ipratropium-albuterol (DUONEB) 0.5-2.5 (3) MG/3ML SOLN Take 3 mLs by nebulization every 6 (six) hours as needed. 12/08/22   Waymon Budge, MD  ketoconazole (NIZORAL) 2 % shampoo Apply 1 Application topically 2 (two) times  a week. 10/16/22   Kerri Perches, MD  lamoTRIgine (LAMICTAL) 150 MG tablet Take 1 tablet (150 mg total) by mouth 2 (two) times daily. 01/26/23 02/25/23  Lorin Glass, MD  levothyroxine (SYNTHROID) 50 MCG tablet TAKE 1 TABLET DAILY SIX DAYS A WEEK AND 1/2 TABLET ON ONE DAY A WEEK Patient taking differently: Take 50 mcg by mouth See admin instructions. Take 50 mg by mouth for 6 days a week and 25 mg on one day a week 11/15/22   Carlus Pavlov, MD  melatonin 3 MG TABS tablet Take 1 tablet (3 mg total) by mouth at bedtime as needed. 01/26/23   Lorin Glass, MD  mirabegron ER  (MYRBETRIQ) 50 MG TB24 tablet Take 1 tablet (50 mg total) by mouth daily. 12/18/22   Donnita Falls, FNP  Misc. Devices (MATTRESS PAD) MISC FIRM MATTRESS PAD for hospital bed x 1 02/22/21   Kerri Perches, MD  montelukast (SINGULAIR) 10 MG tablet Take 10 mg by mouth daily. 10/27/22   [provider]  Multiple Vitamins-Minerals (MULTIVITAMIN GUMMIES ADULT PO) Take 1 tablet by mouth daily.    [provider]  ondansetron (ZOFRAN) 4 MG tablet Take one tablet by mouth once daily, as needed , for nausea 12/12/22   Kerri Perches, MD  polyethylene glycol (MIRALAX / GLYCOLAX) 17 g packet Take 17 g by mouth daily as needed. 01/26/23   Lorin Glass, MD  pregabalin (LYRICA) 75 MG capsule Take 1 capsule (75 mg total) by mouth daily. 01/26/23 02/25/23  Lorin Glass, MD  RABEprazole (ACIPHEX) 20 MG tablet TAKE 1 TABLET TWICE DAILY 12/28/22   Pyrtle, Carie Caddy, MD  RESTASIS 0.05 % ophthalmic emulsion Place 2 drops into both eyes daily. 07/29/20   [provider]  rosuvastatin (CRESTOR) 5 MG tablet TAKE 1 TABLET AT BEDTIME 06/12/22   Kerri Perches, MD  senna-docusate (SENOKOT-S) 8.6-50 MG tablet Take 1 tablet by mouth 2 (two) times daily. 01/26/23   Lorin Glass, MD  sertraline (ZOLOFT) 100 MG tablet Take 1 tablet (100 mg total) by mouth every morning. 01/26/23 02/25/23  Lorin Glass, MD  UNABLE TO FIND Med Name:  G5 Mattress 07/31/22   Kerri Perches, MD  vitamin E 180 MG (400 UNITS) capsule Take 400 Units by mouth daily.    [provider]      Allergies    Iron, Milk (cow), Penicillins, Phenazopyridine, Cephalexin, Flonase [fluticasone], and Milk-related compounds    Review of Systems   Review of Systems  Physical Exam Updated Vital Signs BP (!) 136/52   Pulse 81   Temp 98.4 F (36.9 C) (Rectal)   Resp 17   Ht 4\' 11"  (1.499 m)   Wt 70 kg   SpO2 94%   BMI 31.17 kg/m  Physical Exam  ED Results / Procedures / Treatments   Labs (all labs  ordered are listed, but only abnormal results are displayed) Labs Reviewed  CBC WITH DIFFERENTIAL/PLATELET - Abnormal; Notable for the following components:      Result Value   Hemoglobin 10.3 (*)    MCV 78.0 (*)    MCH 21.6 (*)    MCHC 27.7 (*)    RDW 16.6 (*)    All other components within normal limits  COMPREHENSIVE METABOLIC PANEL - Abnormal; Notable for the following components:   Potassium 3.4 (*)    Glucose, Bld 108 (*)    Creatinine, Ser 1.30 (*)    GFR, Estimated 44 (*)  All other components within normal limits  ETHANOL  PROTIME-INR  APTT  RAPID URINE DRUG SCREEN, HOSP PERFORMED  URINALYSIS, ROUTINE W REFLEX MICROSCOPIC  CBG MONITORING, ED  I-STAT CHEM 8, ED  TYPE AND SCREEN    EKG None  Radiology CT ANGIO HEAD NECK W WO CM (CODE STROKE)  Result Date: 02/17/2023 CLINICAL DATA:  Unresponsive EXAM: CT ANGIOGRAPHY HEAD AND NECK WITH AND WITHOUT CONTRAST TECHNIQUE: Multidetector CT imaging of the head and neck was performed using the standard protocol during bolus administration of intravenous contrast. Multiplanar CT image reconstructions and MIPs were obtained to evaluate the vascular anatomy. Carotid stenosis measurements (when applicable) are obtained utilizing NASCET criteria, using the distal internal carotid diameter as the denominator. RADIATION DOSE REDUCTION: This exam was performed according to the departmental dose-optimization program which includes automated exposure control, adjustment of the mA and/or kV according to patient size and/or use of iterative reconstruction technique. CONTRAST:  60mL OMNIPAQUE IOHEXOL 350 MG/ML SOLN COMPARISON:  None Available. FINDINGS: CTA NECK FINDINGS SKELETON: No acute abnormality or high grade bony spinal canal stenosis. OTHER NECK: Heterogeneous multinodular thyroid gland. UPPER CHEST: No pneumothorax or pleural effusion. No nodules or masses. AORTIC ARCH: There is calcific atherosclerosis of the aortic arch. Aberrant right  subclavian artery. RIGHT CAROTID SYSTEM: Normal without aneurysm, dissection or stenosis. LEFT CAROTID SYSTEM: No dissection, occlusion or aneurysm. Mild atherosclerotic calcification at the carotid bifurcation without hemodynamically significant stenosis. VERTEBRAL ARTERIES: Left dominant configuration. Normal CTA HEAD FINDINGS POSTERIOR CIRCULATION: Mild atherosclerotic calcification of the left V4 segment with moderate stenosis. No proximal occlusion of the anterior or inferior cerebellar arteries. Basilar artery is normal. Superior cerebellar arteries are normal. Posterior cerebral arteries are normal. ANTERIOR CIRCULATION: Atherosclerotic calcification of the internal carotid arteries at the skull base without hemodynamically significant stenosis. Anterior cerebral arteries are normal. Middle cerebral arteries are normal. VENOUS SINUSES: As permitted by contrast timing, patent. ANATOMIC VARIANTS: Fetal origin of the right posterior cerebral artery. Review of the MIP images confirms the above findings. IMPRESSION: 1. No emergent large vessel occlusion. 2. Moderate stenosis of the left V4 segment. Aortic Atherosclerosis (ICD10-I70.0). Electronically Signed   By: Deatra Robinson M.D.   On: 02/17/2023 19:36   CT HEAD CODE STROKE WO CONTRAST  Result Date: 02/17/2023 CLINICAL DATA:  Code stroke.  Altered mental status EXAM: CT HEAD WITHOUT CONTRAST TECHNIQUE: Contiguous axial images were obtained from the base of the skull through the vertex without intravenous contrast. RADIATION DOSE REDUCTION: This exam was performed according to the departmental dose-optimization program which includes automated exposure control, adjustment of the mA and/or kV according to patient size and/or use of iterative reconstruction technique. COMPARISON:  CT Head 02/11/23 FINDINGS: Brain: Postsurgical encephalomalacia along the medial frontal lobes bilaterally. No hemorrhage. No hydrocephalus. No extra-axial fluid collection. No CT  evidence of an acute cortical infarct. No mass effect. No mass lesion. Vascular: No hyperdense vessel or unexpected calcification. Skull: Postsurgical changes from bifrontal craniotomy. Sinuses/Orbits: No middle ear or mastoid effusion. Paranasal sinuses are grossly clear. Bilateral lens replacement. Orbits are otherwise unremarkable. Other: None. ASPECTS ([place] Stroke Program Early CT Score): 10 IMPRESSION: 1. No hemorrhage or CT evidence of an acute cortical infarct. 2. Postsurgical encephalomalacia along the medial frontal lobes bilaterally. Findings were discussed with Dr. Estell Harpin on 02/17/23 at 6:55 PM. Electronically Signed   By: Lorenza Cambridge M.D.   On: 02/17/2023 18:56    Procedures Procedures  {Document cardiac monitor, telemetry assessment procedure when appropriate:1}  Medications Ordered in  ED Medications  levETIRAcetam (KEPPRA) IVPB 1500 mg/ 100 mL premix (1,500 mg Intravenous New Bag/Given 02/17/23 1954)    Followed by  levETIRAcetam (KEPPRA) IVPB 500 mg/100 mL premix (has no administration in time range)  LORazepam (ATIVAN) injection 2 mg (2 mg Intravenous Given 02/17/23 1859)  iohexol (OMNIPAQUE) 350 MG/ML injection 60 mL (60 mLs Intravenous Contrast Given 02/17/23 1910)    ED Course/ Medical Decision Making/ A&P   {  CRITICAL CARE Performed by: Bethann Berkshire Total critical care time: 55 minutes Critical care time was exclusive of separately billable procedures and treating other patients. Critical care was necessary to treat or prevent imminent or life-threatening deterioration. Critical care was time spent personally by me on the following activities: development of treatment plan with patient and/or surrogate as well as nursing, discussions with consultants, evaluation of patient's response to treatment, examination of patient, obtaining history from patient or surrogate, ordering and performing treatments and interventions, ordering and review of laboratory studies, ordering  and review of radiographic studies, pulse oximetry and re-evaluation of patient's condition.   Patient was seen by neurology and it was decided not to give the patient any thrombolytics.  The neurologist spoke with the patient's family member.  Dr. Selina Cooley the neurologist gave the patient 2 mg of Ativan and wanted the patient admitted over at [facility] by the hospitalist to get continuous EEG monitoring.  And an MRI of the brain Click here for ABCD2, HEART and other calculatorsREFRESH Note before signing :1}                              Medical Decision Making Amount and/or Complexity of Data Reviewed Labs: ordered. Radiology: ordered.   Altered mental status, possible related to his stroke or seizures.  She is admitted to hospitalist over [facility] and will be consulted on by the neurologist  {Document critical care time when appropriate:1} {Document review of labs and clinical decision tools ie heart score, Chads2Vasc2 etc:1}  {Document your independent review of radiology images, and any outside records:1} {Document your discussion with family members, caretakers, and with consultants:1} {Document social determinants of health affecting pt's care:1} {Document your decision making why or why not admission, treatments were needed:1} Final Clinical Impression(s) / ED Diagnoses Final diagnoses:  None    Rx / DC Orders ED Discharge Orders     None

## 2023-02-17 NOTE — Discharge Instructions (Addendum)
Patient needs to be transferred to the emergency department at [facility].  Dr. Rush Landmark excepting the patient to the emergency department

## 2023-02-17 NOTE — Progress Notes (Signed)
1839 - Code Stroke Activated  mRS 3, LKWT unable to confirm with facility. Per ems lkw 1600 1842: Paged Dr. Selina Cooley 1845 - Patient to CT 1852 - Patient returned to room 1852 - Dr. Selina Cooley assessing patient TNK not given d/t lkw time unable to be confirmed, family agreed via phone, and multiple falls today.

## 2023-02-17 NOTE — ED Provider Notes (Signed)
Patient is being admitted to the hospitalist service over at [facility].  There is presently no beds for the patient to be admitted to.  The neurologist wants the patient on continuous EEG monitoring so the hospitalist would like the patient transferred to the [facility] emergency department.  Dr. Rush Landmark is excepting ED physician   Bethann Berkshire, MD 02/17/23 2042

## 2023-02-17 NOTE — ED Notes (Signed)
Pt confused and attempting to climb out of bed. Yellow socks, fall band placed. Fall signage placed. Seizure pads to siderails. Mittens placed as pt attempting to pull off monitoring and IV.

## 2023-02-17 NOTE — ED Notes (Signed)
MD at bedside Stroke alert called.

## 2023-02-17 NOTE — ED Notes (Signed)
Patient en route to [facility] ED via [facility].

## 2023-02-17 NOTE — H&P (Signed)
TRH H&P   Patient Demographics:    [name], is a 72 y.o. female  MRN: [id number]   DOB - Dec 18, 1950  Admit Date - 02/17/2023  Outpatient Primary MD for the patient is Kerri Perches, MD  Referring MD/NP/PA: Dr. Estell Harpin   Patient coming from: SNF [facility]  Chief Complaint  Patient presents with   Code Stroke      HPI:    [name]  is a 72 y.o. female, with PMH significant for DM2, HTN, HLD, chronic smoking, OSA on CPAP, stroke, COPD hypothyroidism, seizure, bipolar disorder, fibromyalgia, arthritis, diverticulosis, GERD, chronic anemia, patient with recent hospitalization due to encephalopathy, AKI and seizure disorders, he was discharged to SNF facility. -Currently obtundent, encephalopathic, history was obtained from records and consultants and staff, patient was brought by EMS for altered mental status, code stroke was called, patient was last known well 4 PM this afternoon, her blood sugar was checked, she is oriented x 2 usually, ambulates with a walker or cane, no further information could be obtained from the facility, code stroke was called, CT head showed bifrontal encephalomalacia, consistent with her known prior resection of meningioma, as well there was possible fall at the facility, she was deemed high risk for TNK, please see discussion below, she received Ativan for concern of seizures, she is somnolent upon my evaluation and exam, patient was loaded with Keppra, and started on IV Keppra as unable to take any of her home meds, she received aspirin per rectum as well, and she is being transferred ED to ED for LTM EEG monitoring per ED physician discussion with teleneurology, no bed is available telemetry floor, currently ED working transferred to ED at [facility] as no beds available on telemetry floor.      Review of systems:    Unable to obtain  review of system due to altered mental status  With Past History of the following :    Past Medical History:  Diagnosis Date   Allergy    Anemia    Anemia in chronic kidney disease (CKD) 08/10/2021   Anemia in chronic kidney disease (CKD) 10/12/2021   Anxiety    takes Ativan daily   Arthritis    Assistance needed for mobility    Bipolar disorder (HCC)    takes Risperdal nightly   Blood transfusion    Brain tumor (HCC)    Cancer (HCC)    In her gum   Carpal tunnel syndrome of right wrist 05/23/2011   Cervical disc disorder with radiculopathy of cervical region 10/31/2012   Chronic back pain    Chronic idiopathic constipation    Chronic neck and back pain    Colon polyps    COPD (chronic obstructive pulmonary disease) with chronic bronchitis (HCC) 09/16/2013   Office Spirometry 10/30/2013-submaximal effort based on appearance of loop and curve. Numbers would fit with severe restriction  but her physiologic capability may be better than this. FVC 0.91/44%, and 10.74/45%, FEV1/FVC 0.81, FEF 25-75% 1.43/69%     Diabetes mellitus    Type II   Diverticulosis    TCS 9/08 by Dr. Lina Sar for diarrhea . Bx for micro scopic colitis negative.    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   Metabolic encephalopathy 08/03/2011   Migraines    chronic headaches   Mononeuritis lower limb    Narcolepsy    Osteoporosis    Pancreatitis 2006   due to Depakote with normal EUS    Paralysis (HCC)    Pneumonia     Schatzki's ring    non critical / EGD with ED 8/2011with RMR   Seizures (HCC)    takes Lamictal daily.Last seizure 3 yrs ago   Sleep apnea    on CPAP   Small bowel obstruction (HCC)    Stroke (HCC)    left sided weakness, speech changes   Tubular adenoma of colon       Past Surgical History:  Procedure Laterality Date   ABDOMINAL HYSTERECTOMY  1978   BACK SURGERY  July 2012   BACTERIAL OVERGROWTH TEST N/A 05/05/2013   Procedure: BACTERIAL OVERGROWTH TEST;  Surgeon: Corbin Ade, MD;  Location: [facility];  Service: Endoscopy;  Laterality: N/A;  7:30   BIOPSY THYROID  2009   BRAIN SURGERY  11/2011   resection of meningioma   BREAST REDUCTION SURGERY  1994   CARDIAC CATHETERIZATION  05/10/2005   normal coronaries, normal LV systolic function and EF (Dr. Evlyn Courier)   CARPAL TUNNEL RELEASE Left 07/22/04   Dr. Romeo Apple   CATARACT EXTRACTION Bilateral    CHOLECYSTECTOMY  1984   COLONOSCOPY N/A 09/25/2012   WUX:[id number] diverticulosis.  colonic polyp-removed : tubular adenoma   CRANIOTOMY  11/23/2011   Procedure: CRANIOTOMY TUMOR EXCISION;  Surgeon: Hewitt Shorts, MD;  Location: [facility]URO ORS;  Service: Neurosurgery;  Laterality: N/A;  Craniotomy for tumor resection   ESOPHAGOGASTRODUODENOSCOPY  12/29/2010   Rourk-Retained food in the esophagus and stomach, small hiatal hernia, status post Maloney dilation of the esophagus   ESOPHAGOGASTRODUODENOSCOPY N/A 09/25/2012   [id number] atonic baggy esophagus status post Maloney dilation 56 F. Hiatal hernia   GIVENS CAPSULE STUDY N/A 01/15/2013   NORMAL.    IR GENERIC HISTORICAL  03/17/2016   IR RADIOLOGIST EVAL & MGMT 03/17/2016 [facility]INTER[facility] RAD   LESION REMOVAL N/A 05/31/2015   Procedure: REMOVAL RIGHT AND LEFT LESIONS OF MANDIBLE;  Surgeon: Ocie Doyne, DDS;  Location: [facility] OR;  Service: Oral Surgery;  Laterality: N/A;   MALONEY DILATION  12/29/2010   RMR;   NM MYOCAR PERF WALL MOTION  2006   "relavtiely normal" persantine, mild anterior  thinning (breast attenuation artifact), no region of scar/ischemia   OVARIAN CYST REMOVAL     RECTOCELE REPAIR N/A 06/29/2015   Procedure: POSTERIOR  REPAIR (RECTOCELE);  Surgeon: Tilda Burrow, MD;  Location: [facility] ORS;  Service: Gynecology;  Laterality: N/A;   REDUCTION MAMMAPLASTY Bilateral    REVERSE SHOULDER ARTHROPLASTY Left 08/18/2022   Procedure: REVERSE SHOULDER ARTHROPLASTY;  Surgeon: Beverely Low, MD;  Location: [facility] ORS;  Service: Orthopedics;  Laterality: Left;  choice with interscalene, general   SPINE SURGERY  09/29/2010   Dr. Shon Baton   surgical excision of 3 tumors from right thigh and right buttock  and left upper thigh  2010   TOOTH EXTRACTION Bilateral 12/14/2014   Procedure: REMOVAL OF BILATERAL MANDIBULAR EXOSTOSES;  Surgeon: Ocie Doyne, DDS;  Location: [facility] OR;  Service: Oral Surgery;  Laterality: Bilateral;   TRANSTHORACIC ECHOCARDIOGRAM  2010   EF 60-65%, mild conc LVH, grade 1 diastolic dysfunction; mildly calcified MV annulus with mildly thickened leaflets, mildly calcified MR annulus      Social History:     Social History   Tobacco Use   Smoking status: Some Days    Current packs/day: 0.00    Average packs/day: 0.5 packs/day for 47.0 years (23.5 ttl pk-yrs)    Types: Cigarettes    Start date: 07/06/1974    Last attempt to quit: 07/05/2021    Years since quitting: 1.6    Passive exposure: Past   Smokeless tobacco: Never   Tobacco comments:    3-4 a day   Substance Use Topics   Alcohol use: No    Alcohol/week: 0.0 standard drinks of alcohol    Comment:         Family History :     Family History  Problem Relation Age of Onset   Heart attack Mother        HTN   Pneumonia Father    Kidney failure Father    Diabetes Father    Pancreatic cancer Sister    Cancer Sister        breast    Cancer Sister        pancreatic   Diabetes Brother    Hypertension Brother    Diabetes Brother    Alcohol abuse Maternal Uncle    Stroke Maternal Grandmother     Heart attack Maternal Grandfather    Hypertension Son    Sleep apnea Son    Colon cancer Neg Hx    Anesthesia problems Neg Hx    Hypotension Neg Hx    Malignant hyperthermia Neg Hx    Pseudochol deficiency Neg Hx    Breast cancer Neg Hx    Stomach cancer Neg Hx       Home Medications:   Prior to Admission medications   Medication Sig Start Date End Date Taking? Authorizing Provider  acetaminophen (TYLENOL) 325 MG tablet Take 650 mg by mouth every 6 (six) hours as needed for mild pain, moderate pain or headache.    [provider]  alendronate (FOSAMAX) 70 MG tablet TAKE 1 TABLET EVERY 7 DAYS ON AN EMPTY STOMACH WITH A FULL GLASS OF WATER Patient taking differently: Take 70 mg by mouth once a week. 11/07/21   Kerri Perches, MD  amLODipine (NORVASC) 5 MG tablet Take 1 tablet (5 mg total) by mouth daily. 01/17/23   Catarina Hartshorn, MD  aspirin EC 81 MG tablet Take 1 tablet (81 mg total) by mouth daily with breakfast. 12/08/18   Shon Hale, MD  Blood Glucose Calibration (ACCU-CHEK GUIDE CONTROL) LIQD USE AS DIRECTED 12/13/20   Carlus Pavlov, MD  blood glucose meter kit and supplies Dispense based  on patient and insurance preference. Use up to four times daily as directed. (FOR ICD-10 E10.9, E11.9). 06/27/19   Carlus Pavlov, MD  carvedilol (COREG) 12.5 MG tablet Take 12.5 mg by mouth 2 (two) times daily with a meal.    [provider]  Cholecalciferol (D3 PO) Take 1 tablet by mouth daily.    [provider]  Continuous Blood Gluc Sensor (FREESTYLE LIBRE 14 DAY SENSOR) MISC 1 each by Does not apply route every 14 (fourteen) days. Change every 2 weeks 06/24/19   Carlus Pavlov, MD  Docusate Sodium (DSS) 100 MG CAPS Take 100 mg by mouth in the morning and at bedtime.    [provider]  DROPLET PEN NEEDLES 31G X 8 MM MISC USE FOR INJECTING INSULIN 4 TIMES DAILY. 09/02/21   Carlus Pavlov, MD  Elastic Bandages & Supports (ADJUSTABLE ARM SLING)  MISC L arm sling 05/04/22   Kerri Perches, MD  ezetimibe (ZETIA) 10 MG tablet TAKE 1 TABLET EVERY DAY 01/19/22   Kerri Perches, MD  glucose blood (ACCU-CHEK GUIDE) test strip Use as instructed 12/12/22   Carlus Pavlov, MD  insulin aspart (NOVOLOG) 100 UNIT/ML injection Inject 0-5 Units into the skin at bedtime. 01/26/23   Dahal, Melina Schools, MD  insulin aspart (NOVOLOG) 100 UNIT/ML injection Inject 0-15 Units into the skin 3 (three) times daily with meals. 01/26/23   Dahal, Melina Schools, MD  insulin glargine, 2 Unit Dial, (TOUJEO MAX SOLOSTAR) 300 UNIT/ML Solostar Pen INJECT 20-26 UNITS INTO THE SKIN DAILY. 02/13/23   Carlus Pavlov, MD  insulin glargine-yfgn (SEMGLEE) 100 UNIT/ML injection Inject 0.1 mLs (10 Units total) into the skin at bedtime. 01/26/23   Dahal, Melina Schools, MD  ipratropium-albuterol (DUONEB) 0.5-2.5 (3) MG/3ML SOLN Take 3 mLs by nebulization every 6 (six) hours as needed. 12/08/22   Waymon Budge, MD  ketoconazole (NIZORAL) 2 % shampoo Apply 1 Application topically 2 (two) times a week. 10/16/22   Kerri Perches, MD  lamoTRIgine (LAMICTAL) 150 MG tablet Take 1 tablet (150 mg total) by mouth 2 (two) times daily. 01/26/23 02/25/23  Lorin Glass, MD  levothyroxine (SYNTHROID) 50 MCG tablet TAKE 1 TABLET DAILY SIX DAYS A WEEK AND 1/2 TABLET ON ONE DAY A WEEK Patient taking differently: Take 50 mcg by mouth See admin instructions. Take 50 mg by mouth for 6 days a week and 25 mg on one day a week 11/15/22   Carlus Pavlov, MD  melatonin 3 MG TABS tablet Take 1 tablet (3 mg total) by mouth at bedtime as needed. 01/26/23   Lorin Glass, MD  mirabegron ER (MYRBETRIQ) 50 MG TB24 tablet Take 1 tablet (50 mg total) by mouth daily. 12/18/22   Donnita Falls, FNP  Misc. Devices (MATTRESS PAD) MISC FIRM MATTRESS PAD for hospital bed x 1 02/22/21   Kerri Perches, MD  montelukast (SINGULAIR) 10 MG tablet Take 10 mg by mouth daily. 10/27/22   [provider]  Multiple  Vitamins-Minerals (MULTIVITAMIN GUMMIES ADULT PO) Take 1 tablet by mouth daily.    [provider]  ondansetron (ZOFRAN) 4 MG tablet Take one tablet by mouth once daily, as needed , for nausea 12/12/22   Kerri Perches, MD  polyethylene glycol (MIRALAX / GLYCOLAX) 17 g packet Take 17 g by mouth daily as needed. 01/26/23   Lorin Glass, MD  pregabalin (LYRICA) 75 MG capsule Take 1 capsule (75 mg total) by mouth daily. 01/26/23 02/25/23  Lorin Glass, MD  RABEprazole (ACIPHEX) 20 MG  tablet TAKE 1 TABLET TWICE DAILY 12/28/22   Pyrtle, Carie Caddy, MD  RESTASIS 0.05 % ophthalmic emulsion Place 2 drops into both eyes daily. 07/29/20   [provider]  rosuvastatin (CRESTOR) 5 MG tablet TAKE 1 TABLET AT BEDTIME 06/12/22   Kerri Perches, MD  senna-docusate (SENOKOT-S) 8.6-50 MG tablet Take 1 tablet by mouth 2 (two) times daily. 01/26/23   Lorin Glass, MD  sertraline (ZOLOFT) 100 MG tablet Take 1 tablet (100 mg total) by mouth every morning. 01/26/23 02/25/23  Lorin Glass, MD  UNABLE TO FIND Med Name:  G5 Mattress 07/31/22   Kerri Perches, MD  vitamin E 180 MG (400 UNITS) capsule Take 400 Units by mouth daily.    [provider]     Allergies:     Allergies  Allergen Reactions   Iron Itching and Nausea And Vomiting   Milk (Cow) Rash and Other (See Comments)    Doesn't agree with the stomach, also    Penicillins Hives   Phenazopyridine Hives   Cephalexin Hives   Flonase [Fluticasone] Other (See Comments)    "It gave me ulcers in my nose"   Milk-Related Compounds Nausea Only and Other (See Comments)    Doesn't agree with the stomach     Physical Exam:   Vitals  Blood pressure (!) 136/52, pulse 81, temperature 98.4 F (36.9 C), temperature source Rectal, resp. rate 17, height 4\' 11"  (1.499 m), weight 70 kg, SpO2 94%.   1. General Elderly female, obtundent  2.  Is obtundent as received Ativan, able to answer any questions or follow any commands    3.  Able to obtain appropriate medical exam given her encephalopathy   4. Ears appear Normal, . Moist Oral Mucosa.  5. Supple Neck, No JVD, No cervical lymphadenopathy appriciated, No Carotid Bruits.  6. Symmetrical Chest wall movement, Good air movement bilaterally, CTAB.  7. RRR, No Gallops, Rubs or Murmurs, No Parasternal Heave.  8. Positive Bowel Sounds, Abdomen Soft, No tenderness, No organomegaly appriciated,No rebound -guarding or rigidity.  9.  No Cyanosis, Normal Skin Turgor, No Skin Rash or Bruise.  10 joints appear normal , no effusions, Normal ROM.     Data Review:    CBC Recent Labs  Lab 02/17/23 1837  WBC 7.6  HGB 10.3*  HCT 37.2  PLT 251  MCV 78.0*  MCH 21.6*  MCHC 27.7*  RDW 16.6*  LYMPHSABS 1.9  MONOABS 0.9  EOSABS 0.1  BASOSABS 0.0   ------------------------------------------------------------------------------------------------------------------  Chemistries  Recent Labs  Lab 02/17/23 1837  NA 142  K 3.4*  CL 106  CO2 27  GLUCOSE 108*  BUN 16  CREATININE 1.30*  CALCIUM 9.5  AST 19  ALT 14  ALKPHOS 70  BILITOT 0.5   ------------------------------------------------------------------------------------------------------------------ estimated creatinine clearance is 33.3 mL/min (A) (by C-G formula based on SCr of 1.3 mg/dL (H)). ------------------------------------------------------------------------------------------------------------------ No results for input(s): "TSH", "T4TOTAL", "T3FREE", "THYROIDAB" in the last 72 hours.  Invalid input(s): "FREET3"  Coagulation profile Recent Labs  Lab 02/17/23 1837  INR 1.1   ------------------------------------------------------------------------------------------------------------------- No results for input(s): "DDIMER" in the last 72 hours. -------------------------------------------------------------------------------------------------------------------  Cardiac Enzymes No results  for input(s): "CKMB", "TROPONINI", "MYOGLOBIN" in the last 168 hours.  Invalid input(s): "CK" ------------------------------------------------------------------------------------------------------------------    Component Value Date/Time   BNP 180.0 (H) 11/22/2022 1233     ---------------------------------------------------------------------------------------------------------------  Urinalysis    Component Value Date/Time   COLORURINE YELLOW 01/15/2023 1215   APPEARANCEUR CLOUDY (A) 01/15/2023 1215  APPEARANCEUR Clear 12/18/2022 1042   LABSPEC 1.018 01/15/2023 1215   PHURINE 5.0 01/15/2023 1215   GLUCOSEU NEGATIVE 01/15/2023 1215   HGBUR NEGATIVE 01/15/2023 1215   HGBUR negative 03/29/2010 0834   BILIRUBINUR NEGATIVE 01/15/2023 1215   BILIRUBINUR Negative 12/18/2022 1042   KETONESUR NEGATIVE 01/15/2023 1215   PROTEINUR 30 (A) 01/15/2023 1215   UROBILINOGEN 0.2 08/31/2021 1434   UROBILINOGEN 0.2 06/02/2014 0140   NITRITE NEGATIVE 01/15/2023 1215   LEUKOCYTESUR LARGE (A) 01/15/2023 1215    ----------------------------------------------------------------------------------------------------------------   Imaging Results:    CT ANGIO HEAD NECK W WO CM (CODE STROKE)  Result Date: 02/17/2023 CLINICAL DATA:  Unresponsive EXAM: CT ANGIOGRAPHY HEAD AND NECK WITH AND WITHOUT CONTRAST TECHNIQUE: Multidetector CT imaging of the head and neck was performed using the standard protocol during bolus administration of intravenous contrast. Multiplanar CT image reconstructions and MIPs were obtained to evaluate the vascular anatomy. Carotid stenosis measurements (when applicable) are obtained utilizing NASCET criteria, using the distal internal carotid diameter as the denominator. RADIATION DOSE REDUCTION: This exam was performed according to the departmental dose-optimization program which includes automated exposure control, adjustment of the mA and/or kV according to patient size and/or use  of iterative reconstruction technique. CONTRAST:  60mL OMNIPAQUE IOHEXOL 350 MG/ML SOLN COMPARISON:  None Available. FINDINGS: CTA NECK FINDINGS SKELETON: No acute abnormality or high grade bony spinal canal stenosis. OTHER NECK: Heterogeneous multinodular thyroid gland. UPPER CHEST: No pneumothorax or pleural effusion. No nodules or masses. AORTIC ARCH: There is calcific atherosclerosis of the aortic arch. Aberrant right subclavian artery. RIGHT CAROTID SYSTEM: Normal without aneurysm, dissection or stenosis. LEFT CAROTID SYSTEM: No dissection, occlusion or aneurysm. Mild atherosclerotic calcification at the carotid bifurcation without hemodynamically significant stenosis. VERTEBRAL ARTERIES: Left dominant configuration. Normal CTA HEAD FINDINGS POSTERIOR CIRCULATION: Mild atherosclerotic calcification of the left V4 segment with moderate stenosis. No proximal occlusion of the anterior or inferior cerebellar arteries. Basilar artery is normal. Superior cerebellar arteries are normal. Posterior cerebral arteries are normal. ANTERIOR CIRCULATION: Atherosclerotic calcification of the internal carotid arteries at the skull base without hemodynamically significant stenosis. Anterior cerebral arteries are normal. Middle cerebral arteries are normal. VENOUS SINUSES: As permitted by contrast timing, patent. ANATOMIC VARIANTS: Fetal origin of the right posterior cerebral artery. Review of the MIP images confirms the above findings. IMPRESSION: 1. No emergent large vessel occlusion. 2. Moderate stenosis of the left V4 segment. Aortic Atherosclerosis (ICD10-I70.0). Electronically Signed   By: Deatra Robinson M.D.   On: 02/17/2023 19:36   CT HEAD CODE STROKE WO CONTRAST  Result Date: 02/17/2023 CLINICAL DATA:  Code stroke.  Altered mental status EXAM: CT HEAD WITHOUT CONTRAST TECHNIQUE: Contiguous axial images were obtained from the base of the skull through the vertex without intravenous contrast. RADIATION DOSE REDUCTION:  This exam was performed according to the departmental dose-optimization program which includes automated exposure control, adjustment of the mA and/or kV according to patient size and/or use of iterative reconstruction technique. COMPARISON:  CT Head 02/11/23 FINDINGS: Brain: Postsurgical encephalomalacia along the medial frontal lobes bilaterally. No hemorrhage. No hydrocephalus. No extra-axial fluid collection. No CT evidence of an acute cortical infarct. No mass effect. No mass lesion. Vascular: No hyperdense vessel or unexpected calcification. Skull: Postsurgical changes from bifrontal craniotomy. Sinuses/Orbits: No middle ear or mastoid effusion. Paranasal sinuses are grossly clear. Bilateral lens replacement. Orbits are otherwise unremarkable. Other: None. ASPECTS ([place] Stroke Program Early CT Score): 10 IMPRESSION: 1. No hemorrhage or CT evidence of an acute cortical infarct. 2. Postsurgical  encephalomalacia along the medial frontal lobes bilaterally. Findings were discussed with Dr. Estell Harpin on 02/17/23 at 6:55 PM. Electronically Signed   By: Lorenza Cambridge M.D.   On: 02/17/2023 18:56      Assessment & Plan:    Principal Problem:   AMS (altered mental status) Active Problems:   Seizure disorder (HCC)   Acquired hypothyroidism   Type 2 diabetes mellitus with hyperglycemia, with long-term current use of insulin (HCC)   Diabetic polyneuropathy associated with type 2 diabetes mellitus (HCC)   Benign meningioma of brain (HCC)   CHF (congestive heart failure) (HCC)   Anemia in chronic kidney disease (CKD)   CKD (chronic kidney disease) stage 3, GFR 30-59 ml/min (HCC)   Chronic obstructive pulmonary disease (HCC)   Acute metabolic encephalopathy   Acute metabolic encephalopathy Concern for seizures versus CVA -Presents with agitation, restlessness and confusion, code stroke was called, teleneurology consult greatly appreciated -Acute CVA is possible, but she is not a candidate for tPA given  significant encephalopathy, unable to have appropriate neurological exam, and no evidence of large LVO on CTA, and with history of multiple falls recently, she was deemed high risk for hemorrhage if TNK is administered out side the window  at this point, she received aspirin by suppository, will obtain MRI brain for further evaluation -As well concern for seizures, especially with recent hospitalization for seizures, and unclear if she has been taking her seizure meds at the facility or not, she was loaded with Keppra, to continue with Keppra 500 mg twice daily meanwhile, resume lamotrigine when able to, she will need LTM EEG, and per ED discussion with neurology if no bed available at [facility], she will need to be transferred from ED to ED for LTM EEG with ED is working on currently. -Continue with seizure precautions -Further management per neurology when she gets to [facility] -She remains n.p.o., PT, OT, SLP consult -Every 2 hours neurochecks until MRI confirms no stroke -Allow for permissive hypertension until MRI brain confirms no stroke  Hypertension -hold meds and allow for permissive hypertension  Diabetes mellitus, type II, insulin-dependent -Her CBG is acceptable, she is n.p.o., will monitor CBG for now and if started to increase then we will start on insulin sliding scale  COPD -no wheezing, she is currently on room air  - Keep on labetalol as needed  Hypothyroidism -She remains n.p.o., resume when able to take oral, otherwise can start IV Synthroid in 3 days  Hyperlipidemia -Check lipid panel, resume statin when stable   Anxiety-depression -Resume meds when able to take oral  DVT Prophylaxis Heparin  AM Labs Ordered, also please review Full Orders  Family Communication: Admission, patients condition and plan of care including tests being ordered have been discussed with the patient and son Denyse Amass by phone who indicate understanding and agree with the plan and  Code Status.  Code Status full code, she has a MOST form, as well this was confirmed by son, she is full scope of treatment  Likely DC to back to SNF  Consults called: Neurology  Admission status: Inpatient  Time spent in minutes : 70 minutes   Huey Bienenstock M.D on 02/17/2023 at 8:48 PM   [facility] - Office  [phone]

## 2023-02-18 ENCOUNTER — Encounter (HOSPITAL_COMMUNITY): Payer: Self-pay | Admitting: Internal Medicine

## 2023-02-18 ENCOUNTER — Inpatient Hospital Stay (HOSPITAL_COMMUNITY): Payer: Medicare HMO

## 2023-02-18 DIAGNOSIS — G9341 Metabolic encephalopathy: Secondary | ICD-10-CM

## 2023-02-18 DIAGNOSIS — R569 Unspecified convulsions: Secondary | ICD-10-CM

## 2023-02-18 LAB — URINALYSIS, ROUTINE W REFLEX MICROSCOPIC
Bilirubin Urine: NEGATIVE
Glucose, UA: NEGATIVE mg/dL
Ketones, ur: 20 mg/dL — AB
Nitrite: NEGATIVE
Protein, ur: 30 mg/dL — AB
Specific Gravity, Urine: 1.016 (ref 1.005–1.030)
pH: 6 (ref 5.0–8.0)

## 2023-02-18 LAB — CBG MONITORING, ED
Glucose-Capillary: 107 mg/dL — ABNORMAL HIGH (ref 70–99)
Glucose-Capillary: 113 mg/dL — ABNORMAL HIGH (ref 70–99)
Glucose-Capillary: 93 mg/dL (ref 70–99)

## 2023-02-18 LAB — CBC
HCT: 35.2 % — ABNORMAL LOW (ref 36.0–46.0)
Hemoglobin: 10 g/dL — ABNORMAL LOW (ref 12.0–15.0)
MCH: 21.8 pg — ABNORMAL LOW (ref 26.0–34.0)
MCHC: 28.4 g/dL — ABNORMAL LOW (ref 30.0–36.0)
MCV: 76.9 fL — ABNORMAL LOW (ref 80.0–100.0)
Platelets: 226 10*3/uL (ref 150–400)
RBC: 4.58 MIL/uL (ref 3.87–5.11)
RDW: 16.4 % — ABNORMAL HIGH (ref 11.5–15.5)
WBC: 9.3 10*3/uL (ref 4.0–10.5)
nRBC: 0 % (ref 0.0–0.2)

## 2023-02-18 LAB — LIPID PANEL
Cholesterol: 102 mg/dL (ref 0–200)
HDL: 41 mg/dL (ref >40)
LDL Cholesterol: 39 mg/dL (ref 0–99)
Total CHOL/HDL Ratio: 2.5 {ratio}
Triglycerides: 110 mg/dL (ref <150)
VLDL: 22 mg/dL (ref 0–40)

## 2023-02-18 LAB — BASIC METABOLIC PANEL
Anion gap: 13 (ref 5–15)
BUN: 9 mg/dL (ref 8–23)
CO2: 23 mmol/L (ref 22–32)
Calcium: 8.9 mg/dL (ref 8.9–10.3)
Chloride: 108 mmol/L (ref 98–111)
Creatinine, Ser: 0.87 mg/dL (ref 0.44–1.00)
GFR, Estimated: >60 mL/min (ref >60)
Glucose, Bld: 127 mg/dL — ABNORMAL HIGH (ref 70–99)
Potassium: 3.2 mmol/L — ABNORMAL LOW (ref 3.5–5.1)
Sodium: 144 mmol/L (ref 135–145)

## 2023-02-18 LAB — HEMOGLOBIN A1C
Hgb A1c MFr Bld: 5.2 % (ref 4.8–5.6)
Mean Plasma Glucose: 102.54 mg/dL

## 2023-02-18 IMAGING — MG MM DIGITAL DIAGNOSTIC UNILAT*L* W/ TOMO W/ CAD
4 series · 4 of 12 positions shown · non-contrast
Comparison: Previous exam(s).

ACR Breast Density Category a: The breast tissue is almost entirely
fatty.

CLINICAL DATA: 71-year-old with nonfocal outer LEFT breast pain
over the past 2-3 weeks. The patient states that she needs rotator
cuff repair in her LEFT shoulder.

EXAM:
DIGITAL DIAGNOSTIC UNILATERAL LEFT MAMMOGRAM WITH TOMOSYNTHESIS AND
CAD
TECHNIQUE: Left digital diagnostic mammography and breast tomosynthesis was
performed. The images were evaluated with computer-aided detection.

[L CC synth-2D]
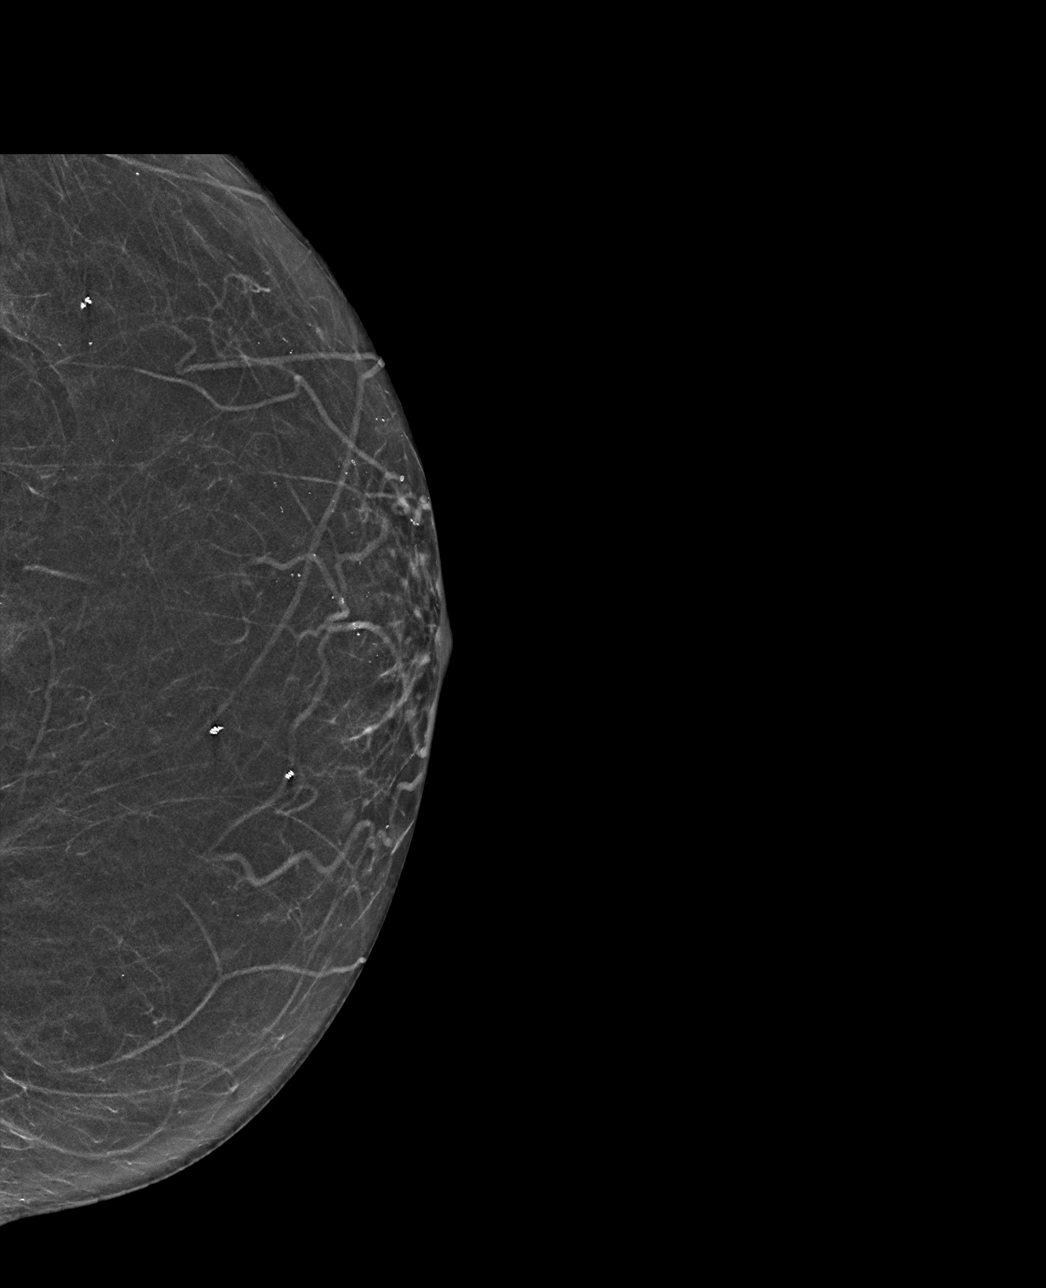

[L MLO synth-2D]
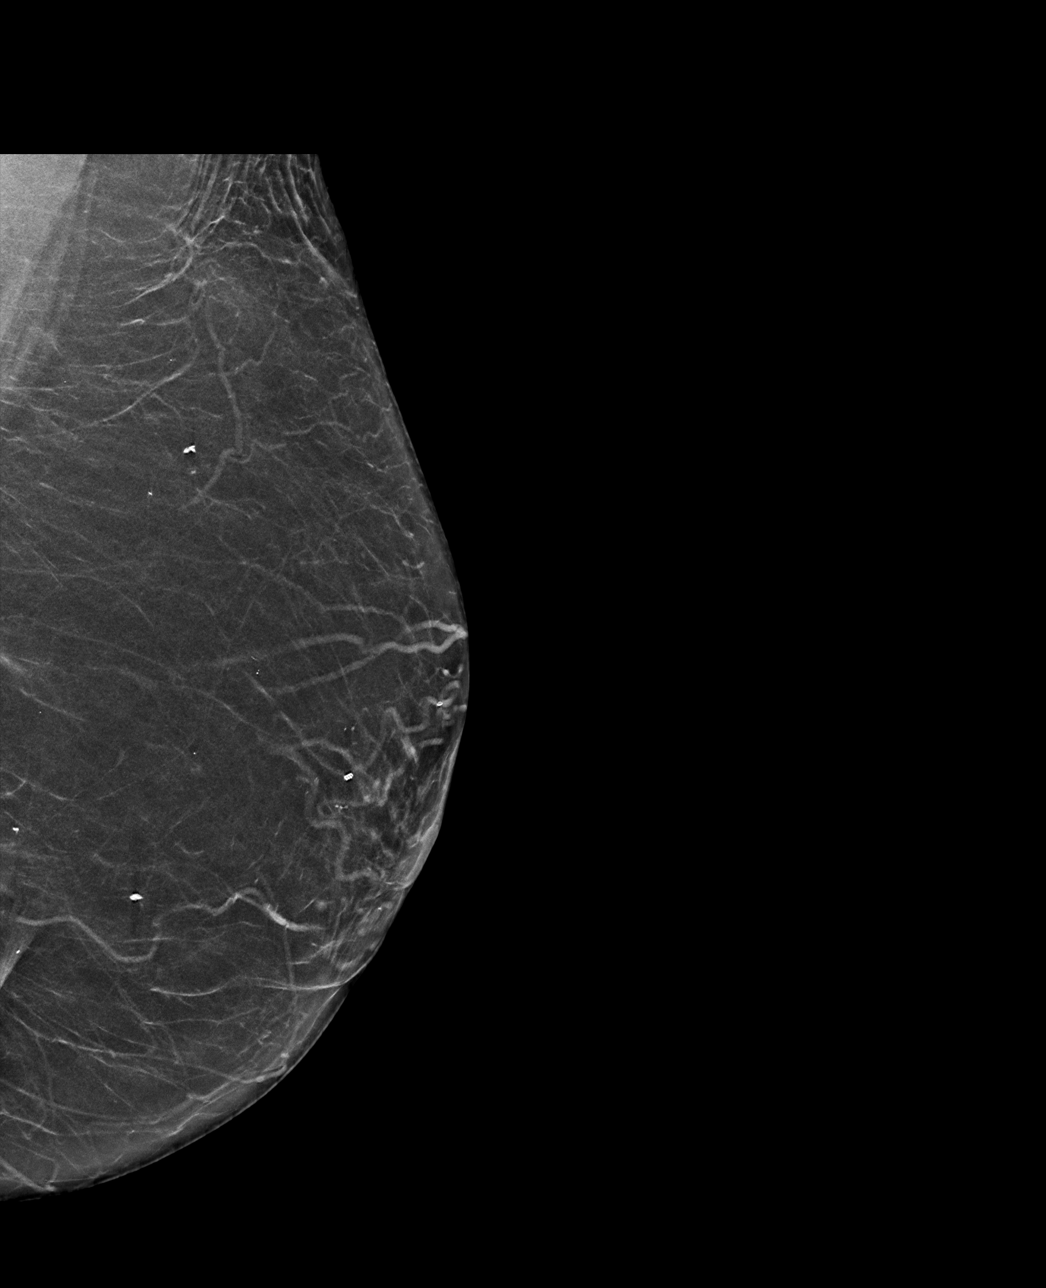

[L CC tomo · tomo slice 26/51.0]
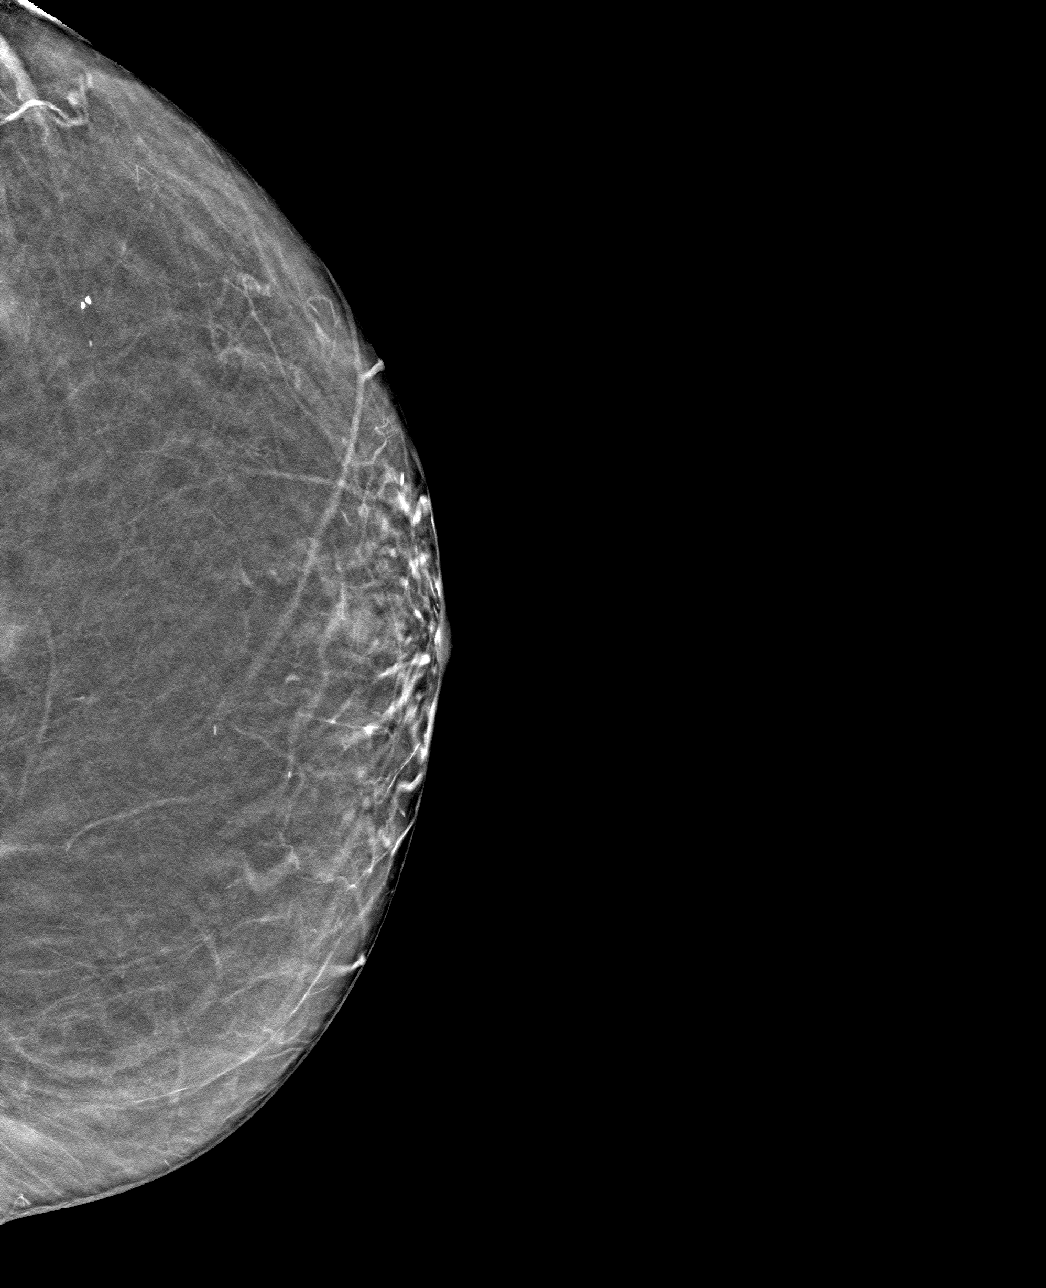

[L MLO tomo · tomo slice 31/60.0]
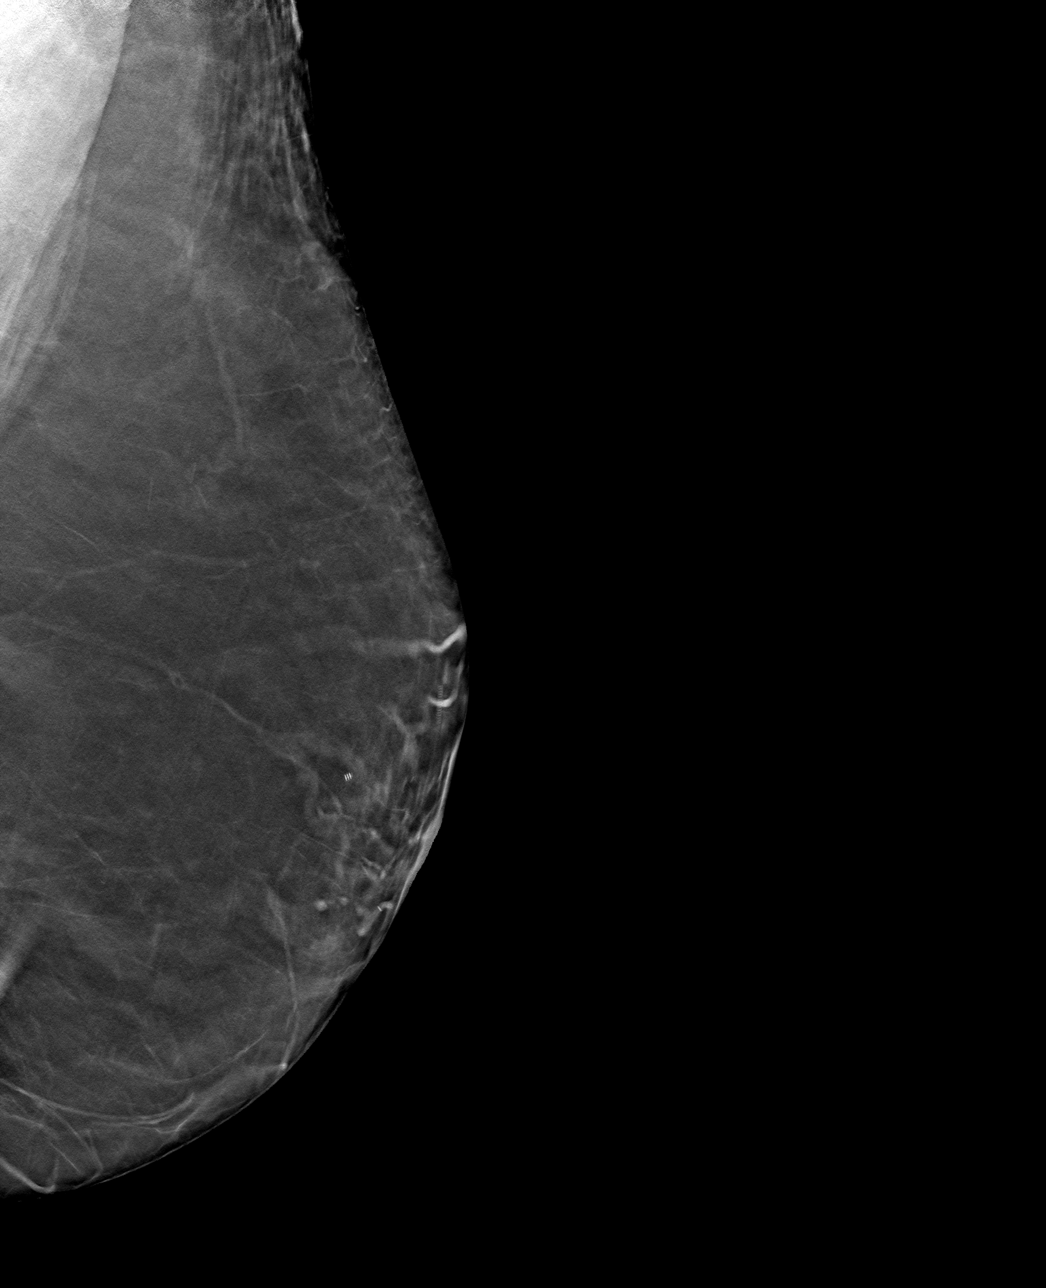

[4 of 12 positions shown; findings below may reference images not displayed]

FINDINGS: Full field CC and MLO views were obtained.

No findings suspicious for malignancy. No mammographic abnormality
to explain outer LEFT breast pain.
IMPRESSION: No mammographic evidence of malignancy involving the LEFT breast.

RECOMMENDATION:
Annual BILATERAL screening mammography which is due in December 2021.

I have discussed the findings and recommendations with the patient.
If applicable, a reminder letter will be sent to the patient
regarding the next appointment.

BI-RADS CATEGORY  1: Negative.

## 2023-02-18 MED ORDER — LORAZEPAM 2 MG/ML IJ SOLN
0.5000 mg | Freq: Once | INTRAMUSCULAR | Status: AC
  Administered 2023-02-18: 0.5 mg via INTRAVENOUS

## 2023-02-18 MED ORDER — HALOPERIDOL LACTATE 5 MG/ML IJ SOLN: 2.0000 mg | INTRAMUSCULAR | Status: DC

## 2023-02-18 MED ORDER — ALBUTEROL SULFATE (2.5 MG/3ML) 0.083% IN NEBU: 2.5000 mg | INHALATION_SOLUTION | RESPIRATORY_TRACT | Status: DC | PRN

## 2023-02-18 MED ORDER — HYDRALAZINE HCL 20 MG/ML IJ SOLN: 5.0000 mg | Freq: Four times a day (QID) | INTRAMUSCULAR | Status: DC | PRN

## 2023-02-18 MED ORDER — LORAZEPAM 2 MG/ML IJ SOLN
0.5000 mg | Freq: Once | INTRAMUSCULAR | Status: AC
  Administered 2023-02-18: 0.5 mg via INTRAVENOUS
  Filled 2023-02-18: qty 1

## 2023-02-18 MED ORDER — LORAZEPAM 2 MG/ML IJ SOLN
INTRAMUSCULAR | Status: AC
  Filled 2023-02-18: qty 1

## 2023-02-18 MED ORDER — HALOPERIDOL LACTATE 5 MG/ML IJ SOLN
2.0000 mg | Freq: Once | INTRAMUSCULAR | Status: AC | PRN
  Administered 2023-02-18: 2 mg via INTRAVENOUS
  Filled 2023-02-18: qty 1

## 2023-02-18 MED ORDER — HALOPERIDOL LACTATE 5 MG/ML IJ SOLN
2.0000 mg | INTRAMUSCULAR | Status: AC
  Administered 2023-02-19: 2 mg via INTRAMUSCULAR
  Filled 2023-02-18: qty 1

## 2023-02-18 NOTE — ED Notes (Signed)
Pt well appearing and slightly restless upon transport upset. EEG disconnected and EEG tech aware.

## 2023-02-18 NOTE — Progress Notes (Signed)
PROGRESS NOTE    [name]  EGB:[id number] DOB: 1950-07-12 DOA: 02/17/2023 PCP: Kerri Perches, MD    Brief Narrative:   [name] is a 72 y.o. female with past medical history significant for HTN, HLD, DM2, history of CVA, COPD, hypothyroidism, seizure disorder, history of meningioma s/p resection, bipolar disorder, fibromyalgia, osteoarthritis, diverticulosis, GERD, chronic anemia, OSA on CPAP with recent hospitalization due to encephalopathy, acute renal failure and seizure subsequent discharge to SNF who presented to [facility] ED on 12/7 via EMS for altered mental status.  Patient was currently obtunded, encephalopathic and history obtained from records, consultants and staff.  On arrival code stroke was initiated.  Apparently last known well approximately 4 PM this afternoon.  At baseline usually oriented x 2, ambulatory with a walker/cane.  Patient was transported the ED for further evaluation and management per EMS.  In the ED, temperature 98.4 F, HR 80, RR 22, BP 146/68, SpO2 94% on room air.  WBC 7.6, hemoglobin 10.3, platelet count 251.  Sodium 142, potassium 3.4, chloride 106, CO2 27, BUN 16, creat 1.30, glucose 108.  AST 19, ALT 14, total bilirubin 0.5.  INR 1.1.  EtOH level less than 10.  CT head without contrast with no hemorrhage or CT evidence of acute cortical infarct, postsurgical encephalomalacia along the medial frontal lobes bilaterally.  CT angiogram head/neck with no emergent large vessel occlusion, moderate stenosis left V4 segment.  Neurology was consulted.  She was deemed high risk for TNK and received Ativan for concern of seizures.  She was loaded with Keppra.  Received aspirin per rectum.  TRH consulted for admission and patient was transferred ED to ED to  for LTM EEG and neurology evaluation.  Assessment & Plan:   Acute metabolic encephalopathy Nonconvulsive seizure Concern for acute CVA Patient presenting from SNF with change  in mental status, agitation.  Last known normal 4 PM day of ED arrival.  Initially code stroke was initiated with CT head and CT angiogram head/neck unrevealing.  Also concern of breakthrough seizures and patient was given Ativan and loaded with Keppra. -- Neurology following, appreciate assistance -- Remains on continuous EEG -- MRI brain without contrast: Pending -- Keppra 500 mg IV every 12 hours -- N.p.o. pending SLP evaluation -- NS at 75 mL/h  Hypokalemia Repleted -- Repeat electrolytes in a.m.  Essential hypertension On amlodipine 5 mg p.o. daily, carvedilol 12.5 m p.o. twice daily -- Allow permissive hypertension until MRI rules out stroke -- Hydralazine 5 mg IV every 6 hours as needed SBP >220 or SBP >120   Hyperlipidemia -- Hold home Zetia and Crestor until speech swallow evaluation  DM2 Home regimen includes Semglee 10 units [place] daily.  Hemoglobin C 5.8 01/13/2023. -- Hold insulin -- Continue monitor glucose intermittently  COPD Stable, on room air -- Albuterol neb every 4 hours as needed shortness of breath/wheezing  Hypothyroidism On levothyroxine 50 mcg p.o. daily. -- Hold until speech swallow evaluation  Chronic anemia Hemoglobin 10.3, stable.    DVT prophylaxis: heparin injection 5,000 Units Start: 02/17/23 2200    Code Status: Full Code Family Communication: No family present at bedside this morning  Disposition Plan:  Level of care: Telemetry Medical Status is: Inpatient Remains inpatient appropriate because: Continuous EEG, pending PT/OT/SLP evaluation, further neurology recommendations needs initiation of diet with toleration, pending MR brain    Consultants:  Neurology  Procedures:  EEG  Antimicrobials:  None   Subjective: Patient seen examined bedside,  lying in bed.  Remains in ED holding area.  Hooked up to continuous EEG.  Will follow simple commands, opens eyes/mouth to command, otherwise pleasantly confused.  Restless with mitts  covering hands.  No family present.  Unable to gain any further ROS from patient due to her confusion.  Objective: Vitals:   02/18/23 0835 02/18/23 0900 02/18/23 1000 02/18/23 1100  BP: (!) 167/79 (!) 174/74 (!) 178/67 116/79  Pulse:      Resp: (!) 25 (!) 27 (!) 22 (!) 28  Temp:      TempSrc:      SpO2:      Weight:      Height:        Intake/Output Summary (Last 24 hours) at 02/18/2023 1344 Last data filed at 02/18/2023 5284 Gross per 24 hour  Intake 199.83 ml  Output --  Net 199.83 ml   Filed Weights   02/17/23 1837  Weight: 70 kg    Examination:  Physical Exam: GEN: NAD, alert, confused, restless, chronically ill in appearance, appears older than stated age HEENT: NCAT, PERRL, EOMI, sclera clear, dry mucous membranes PULM: CTAB w/o wheezes/crackles, normal respiratory effort, on room air CV: RRR w/o M/G/R GI: abd soft, NTND, + BS MSK: no peripheral edema, moves all extremities independently NEURO: CN II-XII intact, no focal neurological deficit appreciated Integumentary: no concerning rashes/lesions/wounds noted on exposed skin surfaces    Data Reviewed: I have personally reviewed following labs and imaging studies  CBC: Recent Labs  Lab 02/17/23 1837  WBC 7.6  NEUTROABS 4.6  HGB 10.3*  HCT 37.2  MCV 78.0*  PLT 251   Basic Metabolic Panel: Recent Labs  Lab 02/17/23 1837  NA 142  K 3.4*  CL 106  CO2 27  GLUCOSE 108*  BUN 16  CREATININE 1.30*  CALCIUM 9.5   GFR: Estimated Creatinine Clearance: 33.3 mL/min (A) (by C-G formula based on SCr of 1.3 mg/dL (H)). Liver Function Tests: Recent Labs  Lab 02/17/23 1837  AST 19  ALT 14  ALKPHOS 70  BILITOT 0.5  PROT 7.5  ALBUMIN 4.2   No results for input(s): "LIPASE", "AMYLASE" in the last 168 hours. No results for input(s): "AMMONIA" in the last 168 hours. Coagulation Profile: Recent Labs  Lab 02/17/23 1837  INR 1.1   Cardiac Enzymes: No results for input(s): "CKTOTAL", "CKMB",  "CKMBINDEX", "TROPONINI" in the last 168 hours. BNP (last 3 results) No results for input(s): "PROBNP" in the last 8760 hours. HbA1C: No results for input(s): "HGBA1C" in the last 72 hours. CBG: Recent Labs  Lab 02/11/23 1820 02/17/23 1833 02/18/23 0049 02/18/23 0507 02/18/23 0742  GLUCAP 133* 89 93 107* 113*   Lipid Profile: No results for input(s): "CHOL", "HDL", "LDLCALC", "TRIG", "CHOLHDL", "LDLDIRECT" in the last 72 hours. Thyroid Function Tests: No results for input(s): "TSH", "T4TOTAL", "FREET4", "T3FREE", "THYROIDAB" in the last 72 hours. Anemia Panel: No results for input(s): "VITAMINB12", "FOLATE", "FERRITIN", "TIBC", "IRON", "RETICCTPCT" in the last 72 hours. Sepsis Labs: No results for input(s): "PROCALCITON", "LATICACIDVEN" in the last 168 hours.  No results found for this or any previous visit (from the past 240 hour(s)).       Radiology Studies: CT ANGIO HEAD NECK W WO CM (CODE STROKE)  Result Date: 02/17/2023 CLINICAL DATA:  Unresponsive EXAM: CT ANGIOGRAPHY HEAD AND NECK WITH AND WITHOUT CONTRAST TECHNIQUE: Multidetector CT imaging of the head and neck was performed using the standard protocol during bolus administration of intravenous contrast. Multiplanar CT image reconstructions  and MIPs were obtained to evaluate the vascular anatomy. Carotid stenosis measurements (when applicable) are obtained utilizing NASCET criteria, using the distal internal carotid diameter as the denominator. RADIATION DOSE REDUCTION: This exam was performed according to the departmental dose-optimization program which includes automated exposure control, adjustment of the mA and/or kV according to patient size and/or use of iterative reconstruction technique. CONTRAST:  60mL OMNIPAQUE IOHEXOL 350 MG/ML SOLN COMPARISON:  None Available. FINDINGS: CTA NECK FINDINGS SKELETON: No acute abnormality or high grade bony spinal canal stenosis. OTHER NECK: Heterogeneous multinodular thyroid gland.  UPPER CHEST: No pneumothorax or pleural effusion. No nodules or masses. AORTIC ARCH: There is calcific atherosclerosis of the aortic arch. Aberrant right subclavian artery. RIGHT CAROTID SYSTEM: Normal without aneurysm, dissection or stenosis. LEFT CAROTID SYSTEM: No dissection, occlusion or aneurysm. Mild atherosclerotic calcification at the carotid bifurcation without hemodynamically significant stenosis. VERTEBRAL ARTERIES: Left dominant configuration. Normal CTA HEAD FINDINGS POSTERIOR CIRCULATION: Mild atherosclerotic calcification of the left V4 segment with moderate stenosis. No proximal occlusion of the anterior or inferior cerebellar arteries. Basilar artery is normal. Superior cerebellar arteries are normal. Posterior cerebral arteries are normal. ANTERIOR CIRCULATION: Atherosclerotic calcification of the internal carotid arteries at the skull base without hemodynamically significant stenosis. Anterior cerebral arteries are normal. Middle cerebral arteries are normal. VENOUS SINUSES: As permitted by contrast timing, patent. ANATOMIC VARIANTS: Fetal origin of the right posterior cerebral artery. Review of the MIP images confirms the above findings. IMPRESSION: 1. No emergent large vessel occlusion. 2. Moderate stenosis of the left V4 segment. Aortic Atherosclerosis (ICD10-I70.0). Electronically Signed   By: Deatra Robinson M.D.   On: 02/17/2023 19:36   CT HEAD CODE STROKE WO CONTRAST  Result Date: 02/17/2023 CLINICAL DATA:  Code stroke.  Altered mental status EXAM: CT HEAD WITHOUT CONTRAST TECHNIQUE: Contiguous axial images were obtained from the base of the skull through the vertex without intravenous contrast. RADIATION DOSE REDUCTION: This exam was performed according to the departmental dose-optimization program which includes automated exposure control, adjustment of the mA and/or kV according to patient size and/or use of iterative reconstruction technique. COMPARISON:  CT Head 02/11/23 FINDINGS:  Brain: Postsurgical encephalomalacia along the medial frontal lobes bilaterally. No hemorrhage. No hydrocephalus. No extra-axial fluid collection. No CT evidence of an acute cortical infarct. No mass effect. No mass lesion. Vascular: No hyperdense vessel or unexpected calcification. Skull: Postsurgical changes from bifrontal craniotomy. Sinuses/Orbits: No middle ear or mastoid effusion. Paranasal sinuses are grossly clear. Bilateral lens replacement. Orbits are otherwise unremarkable. Other: None. ASPECTS ([place] Stroke Program Early CT Score): 10 IMPRESSION: 1. No hemorrhage or CT evidence of an acute cortical infarct. 2. Postsurgical encephalomalacia along the medial frontal lobes bilaterally. Findings were discussed with Dr. Estell Harpin on 02/17/23 at 6:55 PM. Electronically Signed   By: Lorenza Cambridge M.D.   On: 02/17/2023 18:56        Scheduled Meds:   stroke: early stages of recovery book   Does not apply Once   cycloSPORINE  1 drop Both Eyes Daily   heparin  5,000 Units Subcutaneous Q8H   LORazepam       Continuous Infusions:  sodium chloride 75 mL/hr at 02/17/23 2305   levETIRAcetam Stopped (02/18/23 0859)     LOS: 1 day    Time spent: 57 minutes spent on chart review, discussion with nursing staff, consultants, updating family and interview/physical exam; more than 50% of that time was spent in counseling and/or coordination of care.    Alvira Philips [place], DO [facility] Available via Epic secure chat 7am-7pm After these hours, please refer to coverage provider listed on [email or web address] 02/18/2023, 1:44 PM

## 2023-02-18 NOTE — ED Notes (Addendum)
This RN assumed care of patient and received off going transfer of care report from off going RN. Pt presents restless and attempting to get out of the bed, though redirectable. Pt is in bilateral mits to prevent from pulling PIVs out. Pt is on gurney at this time, respirations are spontaneous, even, unlabored and symmetrical bilaterally. Pt skin tone is appropriate for ethnicity, dry and warm. Pt connected to CCM, pulse ox and BP. Per night shift RN, EEG and MRI were unsuccessful d/t pt agitation.

## 2023-02-18 NOTE — ED Notes (Signed)
PT cleaned and changed into clean brief and sheets.

## 2023-02-18 NOTE — Progress Notes (Signed)
PT Cancellation Note  Patient Details Name: [name] MRN: [id number] DOB: 1950-06-21   Cancelled Treatment:    Reason Eval/Treat Not Completed: Medical issues which prohibited therapy, per RN, pt on continuous EEG, not responding, and not appropriate for PT eval at this time. Will continue to follow and attempt evaluation when more appropriate.   Vickki Muff, PT, DPT   Acute Rehabilitation Department Office [phone] Secure Chat Communication Preferred   [name] 02/18/2023, 10:15 AM

## 2023-02-18 NOTE — Progress Notes (Signed)
OT Cancellation Note  Patient Details Name: MAKAILYNN HURLOCK MRN: [id number] DOB: February 10, 1951   Cancelled Treatment:    Reason Eval/Treat Not Completed: Other (comment). Per RN, on long term EEG not responsive to commands, and not yet appropriate for therapy evaluation.   Tyler Deis, OTR/L [facility] Acute Rehabilitation Office: [phone]   Myrla Halsted 02/18/2023, 10:13 AM

## 2023-02-18 NOTE — Progress Notes (Addendum)
LTM EEG Partial re- hookup and running - no initial skin breakdown. MRI safe leads used   Night EEG tech started EEG states pt was unable to be done leads P3,P7,01,PZ,grnd,T7 placed. Computer left in the room with pt over night. No data was recorded.

## 2023-02-18 NOTE — Care Plan (Signed)
LTM eeg reviewed till 0930. No seizure or epileptiform discharges. Please review final report for details.  Triva Hueber Annabelle Harman

## 2023-02-18 NOTE — ED Notes (Signed)
Pt sleeping at this time. NAD noted.

## 2023-02-18 NOTE — ED Notes (Signed)
ED TO INPATIENT HANDOFF REPORT  ED Nurse Name and Phone #: Osvaldo Shipper RN [phone]  S Name/Age/Gender [name] 72 y.o. female Room/Bed: 035C/035C  Code Status   Code Status: Full Code  Home/SNF/Other Home None Is this baseline? No      Chief Complaint AMS (altered mental status) [R41.82]  Triage Note BIB EMS from [facility] said pt fell last night and has been combative for the last 10 minutes prior to EMS call.    Allergies Allergies  Allergen Reactions   Iron Itching and Nausea And Vomiting   Milk (Cow) Rash and Other (See Comments)    Doesn't agree with the stomach, also    Penicillins Hives   Phenazopyridine Hives   Cephalexin Hives   Flonase [Fluticasone] Other (See Comments)    "It gave me ulcers in my nose"   Milk-Related Compounds Nausea Only and Other (See Comments)    Doesn't agree with the stomach    Level of Care/Admitting Diagnosis ED Disposition     ED Disposition  Transfer via Transport   Condition  --   Comment  Hospital Area: [facility] [[id number]] Level of Care: Telemetry Medical [[id number]] May admit patient to [facility] or [facility] if equivalent level of care is available:: No Covid Evaluation: Asymptomatic - no recent exposure (last 10 d ays) testing not required Diagnosis: AMS (altered mental status) [[id number]] Admitting Physician: Chiquita Loth Attending Physician: Randol Kern, DAWOOD S [[id number]] Certification:: I certify this patient will need inpatient services for at  least 2 midnights Expected Medical Readiness: 02/19/2023          B Medical/Surgery History Past Medical History:  Diagnosis Date   Allergy    Anemia    Anemia in chronic kidney disease (CKD) 08/10/2021   Anemia in chronic kidney disease (CKD) 10/12/2021   Anxiety    takes Ativan daily   Arthritis    Assistance needed for mobility    Bipolar disorder (HCC)    takes Risperdal nightly   Blood transfusion    Brain tumor (HCC)     Cancer (HCC)    In her gum   Carpal tunnel syndrome of right wrist 05/23/2011   Cervical disc disorder with radiculopathy of cervical region 10/31/2012   Chronic back pain    Chronic idiopathic constipation    Chronic neck and back pain    Colon polyps    COPD (chronic obstructive pulmonary disease) with chronic bronchitis (HCC) 09/16/2013   Office Spirometry 10/30/2013-submaximal effort based on appearance of loop and curve. Numbers would fit with severe restriction but her physiologic capability may be better than this. FVC 0.91/44%, and 10.74/45%, FEV1/FVC 0.81, FEF 25-75% 1.43/69%     Diabetes mellitus    Type II   Diverticulosis    TCS 9/08 by Dr. Lina Sar for diarrhea . Bx for micro scopic colitis negative.    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   Metabolic encephalopathy 08/03/2011   Migraines    chronic headaches   Mononeuritis lower limb    Narcolepsy    Osteoporosis    Pancreatitis 2006   due to Depakote with normal EUS    Paralysis (HCC)    Pneumonia    Schatzki's ring    non critical / EGD with ED 8/2011with RMR   Seizures (HCC)    takes Lamictal daily.Last seizure 3 yrs ago   Sleep apnea    on CPAP   Small bowel obstruction (HCC)    Stroke (HCC)    left sided weakness, speech changes   Tubular adenoma of colon    Past Surgical History:  Procedure Laterality Date   ABDOMINAL HYSTERECTOMY  1978   BACK SURGERY  July 2012   BACTERIAL OVERGROWTH TEST N/A 05/05/2013    Procedure: BACTERIAL OVERGROWTH TEST;  Surgeon: Corbin Ade, MD;  Location: [facility];  Service: Endoscopy;  Laterality: N/A;  7:30   BIOPSY THYROID  2009   BRAIN SURGERY  11/2011   resection of meningioma   BREAST REDUCTION SURGERY  1994   CARDIAC CATHETERIZATION  05/10/2005   normal coronaries, normal LV systolic function and EF (Dr. Evlyn Courier)   CARPAL TUNNEL RELEASE Left 07/22/04   Dr. Romeo Apple   CATARACT EXTRACTION Bilateral    CHOLECYSTECTOMY  1984   COLONOSCOPY N/A 09/25/2012   HYQ:MVHQION diverticulosis.  colonic polyp-removed : tubular adenoma   CRANIOTOMY  11/23/2011   Procedure: CRANIOTOMY TUMOR EXCISION;  Surgeon: Hewitt Shorts, MD;  Location: [facility]UR[facility];  Service: Neurosurgery;  Laterality: N/A;  Craniotomy for tumor resection   ESOPHAGOGASTRODUODENOSCOPY  12/29/2010   Rourk-Retained food in the esophagus and stomach, small hiatal hernia, status post Maloney dilation of the esophagus   ESOPHAGOGASTRODUODENOSCOPY N/A 09/25/2012   GEX:BMWUXLKG atonic baggy esophagus status post Maloney dilation 56 F. Hiatal hernia   GIVENS CAPSULE STUDY N/A 01/15/2013   NORMAL.    IR GENERIC HISTORICAL  03/17/2016   IR RADIOLOGIST EVAL & MGMT 03/17/2016 [facility]INTER[facility] RAD   LESION REMOVAL N/A 05/31/2015   Procedure: REMOVAL RIGHT AND LEFT LESIONS OF MANDIBLE;  Surgeon: Ocie Doyne, DDS;  Location: [facility] OR;  Service: Oral Surgery;  Laterality: N/A;   MALONEY DILATION  12/29/2010   RMR;   NM MYOCAR PERF WALL MOTION  2006   "relavtiely normal" persantine, mild anterior thinning (breast attenuation artifact), no region of scar/ischemia   OVARIAN CYST REMOVAL     RECTOCELE REPAIR N/A 06/29/2015   Procedure: POSTERIOR REPAIR (RECTOCELE);  Surgeon: Tilda Burrow, MD;  Location: [facility] ORS;  Service: Gynecology;  Laterality: N/A;   REDUCTION MAMMAPLASTY Bilateral    REVERSE SHOULDER ARTHROPLASTY Left 08/18/2022   Procedure: REVERSE SHOULDER ARTHROPLASTY;  Surgeon: Beverely Low, MD;  Location: [facility] ORS;   Service: Orthopedics;  Laterality: Left;  choice with interscalene, general   SPINE SURGERY  09/29/2010   Dr. Shon Baton   surgical excision of 3 tumors from right thigh and right buttock  and left upper thigh  2010   TOOTH EXTRACTION Bilateral 12/14/2014   Procedure: REMOVAL OF BILATERAL MANDIBULAR EXOSTOSES;  Surgeon: Ocie Doyne, DDS;  Location: [facility] OR;  Service: Oral Surgery;  Laterality: Bilateral;   TRANSTHORACIC ECHOCARDIOGRAM  2010   EF 60-65%, mild conc LVH, grade 1 diastolic dysfunction; mildly calcified MV annulus with mildly thickened leaflets, mildly calcified MR annulus     A IV Location/Drains/Wounds Patient Lines/Drains/Airways Status  Active Line/Drains/Airways     Name Placement date Placement time Site Days   Peripheral IV 02/17/23 20 G Anterior;Left Forearm 02/17/23  1836  Forearm  1            Intake/Output Last 24 hours  Intake/Output Summary (Last 24 hours) at 02/18/2023 1406 Last data filed at 02/18/2023 0859 Gross per 24 hour  Intake 199.83 ml  Output --  Net 199.83 ml    Labs/Imaging Results for orders placed or performed during the hospital encounter of 02/17/23 (from the past 48 hour(s))  CBG monitoring, ED     Status: None   Collection Time: 02/17/23  6:33 PM  Result Value Ref Range   Glucose-Capillary 89 70 - 99 mg/dL    Comment: Glucose reference range applies only to samples taken after fasting for at least 8 hours.  CBC with Differential     Status: Abnormal   Collection Time: 02/17/23  6:37 PM  Result Value Ref Range   WBC 7.6 4.0 - 10.5 K/uL   RBC 4.77 3.87 - 5.11 MIL/uL   Hemoglobin 10.3 (L) 12.0 - 15.0 g/dL   HCT 16.1 09.6 - 04.5 %   MCV 78.0 (L) 80.0 - 100.0 fL   MCH 21.6 (L) 26.0 - 34.0 pg   MCHC 27.7 (L) 30.0 - 36.0 g/dL   RDW 40.9 (H) 81.1 - 91.4 %   Platelets 251 150 - 400 K/uL   nRBC 0.0 0.0 - 0.2 %   Neutrophils Relative % 60 %   Neutro Abs 4.6 1.7 - 7.7 K/uL   Lymphocytes Relative 26 %   Lymphs Abs 1.9 0.7 - 4.0 K/uL    Monocytes Relative 12 %   Monocytes Absolute 0.9 0.1 - 1.0 K/uL   Eosinophils Relative 2 %   Eosinophils Absolute 0.1 0.0 - 0.5 K/uL   Basophils Relative 0 %   Basophils Absolute 0.0 0.0 - 0.1 K/uL   Immature Granulocytes 0 %   Abs Immature Granulocytes 0.02 0.00 - 0.07 K/uL    Comment: Performed at [facility], [address], [place], [place] [address]  Comprehensive metabolic panel     Status: Abnormal   Collection Time: 02/17/23  6:37 PM  Result Value Ref Range   Sodium 142 135 - 145 mmol/L   Potassium 3.4 (L) 3.5 - 5.1 mmol/L   Chloride 106 98 - 111 mmol/L   CO2 27 22 - 32 mmol/L   Glucose, Bld 108 (H) 70 - 99 mg/dL    Comment: Glucose reference range applies only to samples taken after fasting for at least 8 hours.   BUN 16 8 - 23 mg/dL   Creatinine, Ser 6.21 (H) 0.44 - 1.00 mg/dL   Calcium 9.5 8.9 - 30.8 mg/dL   Total Protein 7.5 6.5 - 8.1 g/dL   Albumin 4.2 3.5 - 5.0 g/dL   AST 19 15 - 41 U/L   ALT 14 0 - 44 U/L   Alkaline Phosphatase 70 38 - 126 U/L   Total Bilirubin 0.5 <1.2 mg/dL   GFR, Estimated 44 (L) >60 mL/min    Comment: (NOTE) Calculated using the CKD-EPI Creatinine Equation (2021)    Anion gap 9 5 - 15    Comment: Performed at [facility], [address], [place], [place] [address]  Type and screen [facility]     Status: None   Collection Time: 02/17/23  6:37 PM  Result Value Ref Range   ABO/RH(D) O POS    Antibody  Screen NEG    Sample Expiration      02/20/2023,2359 Performed at [facility], [address], [place], [place] [address]   Ethanol     Status: None   Collection Time: 02/17/23  6:37 PM  Result Value Ref Range   Alcohol, Ethyl (B) <10 <10 mg/dL    Comment: (NOTE) Lowest detectable limit for serum alcohol is 10 mg/dL.  For medical purposes only. Performed at [facility], [address], [place], [place] [address]   Protime-INR     Status: None   Collection Time: 02/17/23  6:37 PM  Result Value Ref Range   Prothrombin Time  14.0 11.4 - 15.2 seconds   INR 1.1 0.8 - 1.2    Comment: (NOTE) INR goal varies based on device and disease states. Performed at [facility], [address], [place], [place] [address]   APTT     Status: None   Collection Time: 02/17/23  6:37 PM  Result Value Ref Range   aPTT 26 24 - 36 seconds    Comment: Performed at [facility], [address], [place], [place] [address]  CBG monitoring, ED     Status: None   Collection Time: 02/18/23 12:49 AM  Result Value Ref Range   Glucose-Capillary 93 70 - 99 mg/dL    Comment: Glucose reference range applies only to samples taken after fasting for at least 8 hours.   Comment 1 Notify RN    Comment 2 Document in Chart   CBG monitoring, ED     Status: Abnormal   Collection Time: 02/18/23  5:07 AM  Result Value Ref Range   Glucose-Capillary 107 (H) 70 - 99 mg/dL    Comment: Glucose reference range applies only to samples taken after fasting for at least 8 hours.  CBG monitoring, ED     Status: Abnormal   Collection Time: 02/18/23  7:42 AM  Result Value Ref Range   Glucose-Capillary 113 (H) 70 - 99 mg/dL    Comment: Glucose reference range applies only to samples taken after fasting for at least 8 hours.   *Note: Due to a large number of results and/or encounters for the requested time period, some results have not been displayed. A complete set of results can be found in Results Review.   CT ANGIO HEAD NECK W WO CM (CODE STROKE)  Result Date: 02/17/2023 CLINICAL DATA:  Unresponsive EXAM: CT ANGIOGRAPHY HEAD AND NECK WITH AND WITHOUT CONTRAST TECHNIQUE: Multidetector CT imaging of the head and neck was performed using the standard protocol during bolus administration of intravenous contrast. Multiplanar CT image reconstructions and MIPs were obtained to evaluate the vascular anatomy. Carotid stenosis measurements (when applicable) are obtained utilizing NASCET criteria, using the distal internal carotid diameter as the denominator. RADIATION DOSE  REDUCTION: This exam was performed according to the departmental dose-optimization program which includes automated exposure control, adjustment of the mA and/or kV according to patient size and/or use of iterative reconstruction technique. CONTRAST:  60mL OMNIPAQUE IOHEXOL 350 MG/ML SOLN COMPARISON:  None Available. FINDINGS: CTA NECK FINDINGS SKELETON: No acute abnormality or high grade bony spinal canal stenosis. OTHER NECK: Heterogeneous multinodular thyroid gland. UPPER CHEST: No pneumothorax or pleural effusion. No nodules or masses. AORTIC ARCH: There is calcific atherosclerosis of the aortic arch. Aberrant right subclavian artery. RIGHT CAROTID SYSTEM: Normal without aneurysm, dissection or stenosis. LEFT CAROTID SYSTEM: No dissection, occlusion or aneurysm. Mild atherosclerotic calcification at the carotid bifurcation without hemodynamically significant stenosis. VERTEBRAL ARTERIES: Left dominant  configuration. Normal CTA HEAD FINDINGS POSTERIOR CIRCULATION: Mild atherosclerotic calcification of the left V4 segment with moderate stenosis. No proximal occlusion of the anterior or inferior cerebellar arteries. Basilar artery is normal. Superior cerebellar arteries are normal. Posterior cerebral arteries are normal. ANTERIOR CIRCULATION: Atherosclerotic calcification of the internal carotid arteries at the skull base without hemodynamically significant stenosis. Anterior cerebral arteries are normal. Middle cerebral arteries are normal. VENOUS SINUSES: As permitted by contrast timing, patent. ANATOMIC VARIANTS: Fetal origin of the right posterior cerebral artery. Review of the MIP images confirms the above findings. IMPRESSION: 1. No emergent large vessel occlusion. 2. Moderate stenosis of the left V4 segment. Aortic Atherosclerosis (ICD10-I70.0). Electronically Signed   By: Deatra Robinson M.D.   On: 02/17/2023 19:36   CT HEAD CODE STROKE WO CONTRAST  Result Date: 02/17/2023 CLINICAL DATA:  Code stroke.   Altered mental status EXAM: CT HEAD WITHOUT CONTRAST TECHNIQUE: Contiguous axial images were obtained from the base of the skull through the vertex without intravenous contrast. RADIATION DOSE REDUCTION: This exam was performed according to the departmental dose-optimization program which includes automated exposure control, adjustment of the mA and/or kV according to patient size and/or use of iterative reconstruction technique. COMPARISON:  CT Head 02/11/23 FINDINGS: Brain: Postsurgical encephalomalacia along the medial frontal lobes bilaterally. No hemorrhage. No hydrocephalus. No extra-axial fluid collection. No CT evidence of an acute cortical infarct. No mass effect. No mass lesion. Vascular: No hyperdense vessel or unexpected calcification. Skull: Postsurgical changes from bifrontal craniotomy. Sinuses/Orbits: No middle ear or mastoid effusion. Paranasal sinuses are grossly clear. Bilateral lens replacement. Orbits are otherwise unremarkable. Other: None. ASPECTS ([place] Stroke Program Early CT Score): 10 IMPRESSION: 1. No hemorrhage or CT evidence of an acute cortical infarct. 2. Postsurgical encephalomalacia along the medial frontal lobes bilaterally. Findings were discussed with Dr. Estell Harpin on 02/17/23 at 6:55 PM. Electronically Signed   By: Lorenza Cambridge M.D.   On: 02/17/2023 18:56    Pending Labs Unresulted Labs (From admission, onward)     Start     Ordered   02/19/23 0500  CBC  Tomorrow morning,   R        02/18/23 1358   02/19/23 0500  Basic metabolic panel  Tomorrow morning,   R        02/18/23 1358   02/19/23 0500  Magnesium  Tomorrow morning,   R        02/18/23 1358   02/18/23 1011  Urinalysis, Routine w reflex microscopic -Urine, Unspecified Source  Once,   R       Question:  Specimen Source  Answer:  Urine, Unspecified Source   02/18/23 1010            Vitals/Pain Today's Vitals   02/18/23 0835 02/18/23 0900 02/18/23 1000 02/18/23 1100  BP: (!) 167/79 (!) 174/74 (!) 178/67  116/79  Pulse:      Resp: (!) 25 (!) 27 (!) 22 (!) 28  Temp:      TempSrc:      SpO2:      Weight:      Height:      PainSc:        Isolation Precautions No active isolations  Medications Medications  LORazepam (ATIVAN) 2 MG/ML injection ( Intravenous Not Given 02/17/23 2228)  levETIRAcetam (KEPPRA) IVPB 1500 mg/ 100 mL premix (0 mg Intravenous Stopped 02/17/23 2131)    Followed by  levETIRAcetam (KEPPRA) IVPB 500 mg/100 mL premix (0 mg Intravenous Stopped 02/18/23 0859)  cycloSPORINE (  RESTASIS) 0.05 % ophthalmic emulsion 1 drop (1 drop Both Eyes Given 02/18/23 1016)   stroke: early stages of recovery book ( Does not apply Not Given 02/18/23 0940)  0.9 %  sodium chloride infusion ( Intravenous New Bag/Given 02/17/23 2305)  acetaminophen (TYLENOL) tablet 650 mg (has no administration in time range)    Or  acetaminophen (TYLENOL) 160 MG/5ML solution 650 mg (has no administration in time range)    Or  acetaminophen (TYLENOL) suppository 650 mg (has no administration in time range)  senna-docusate (Senokot-S) tablet 1 tablet (has no administration in time range)  heparin injection 5,000 Units (5,000 Units Subcutaneous Given 02/18/23 0530)  LORazepam (ATIVAN) 2 MG/ML injection (  Not Given 02/18/23 0428)  hydrALAZINE (APRESOLINE) injection 5 mg (has no administration in time range)  albuterol (PROVENTIL) (2.5 MG/3ML) 0.083% nebulizer solution 2.5 mg (has no administration in time range)  LORazepam (ATIVAN) injection 2 mg (2 mg Intravenous Given 02/17/23 1859)  iohexol (OMNIPAQUE) 350 MG/ML injection 60 mL (60 mLs Intravenous Contrast Given 02/17/23 1910)  aspirin suppository 300 mg (300 mg Rectal Given 02/17/23 2049)  LORazepam (ATIVAN) injection 0.5 mg (0.5 mg Intravenous Given 02/18/23 0407)  LORazepam (ATIVAN) injection 0.5 mg (0.5 mg Intravenous Given 02/18/23 0426)  haloperidol lactate (HALDOL) injection 2 mg (2 mg Intravenous Given 02/18/23 1202)    Mobility non-ambulatory     Focused  Assessments Neuro Assessment Handoff:  Swallow screen pass? No  Cardiac Rhythm: Normal sinus rhythm NIH Stroke Scale  Dizziness Present: No Headache Present: No Interval: Shift assessment Level of Consciousness (1a.)   : Alert, keenly responsive LOC Questions (1b. )   : Answers neither question correctly LOC Commands (1c. )   : Performs neither task correctly Best Gaze (2. )  : Normal Visual (3. )  : No visual loss Facial Palsy (4. )    : Normal symmetrical movements Motor Arm, Left (5a. )   : No drift Motor Arm, Right (5b. ) : No drift Motor Leg, Left (6a. )  : No drift Motor Leg, Right (6b. ) : No drift Limb Ataxia (7. ): Absent Sensory (8. )  : Normal, no sensory loss Best Language (9. )  : No aphasia Dysarthria (10. ): Normal Extinction/Inattention (11.)   : No Abnormality Complete NIHSS TOTAL: 4 Last date known well: 02/17/23 Last time known well: 1600 Neuro Assessment: Exceptions to WDL Neuro Checks:   Initial (02/17/23 2000)  Has TPA been given? No If patient is a Neuro Trauma and patient is going to OR before floor call report to 4N Charge nurse: [phone] or [phone]   R Recommendations: See Admitting Provider Note  Report given to:   Additional Notes:

## 2023-02-18 NOTE — Progress Notes (Signed)
NEUROLOGY CONSULT FOLLOW UP NOTE   Date of service: February 18, 2023 Patient Name: [name] MRN:  [id number] DOB:  11-19-1950  Brief HPI  [name] is a 72 y.o. female history of bipolar disorder, bifrontal meningioma status post resection, COPD, hypertension, hyperlipidemia, hypothyroidism and seizure disorder who was originally brought in from her facility with agitation.  Baseline, she is alert and oriented x 2 and ambulates with a walker or cane.  She does have frequent falls.  CT head demonstrated no acute abnormality, and CT angiogram did not show large vessel occlusion.  She was given Ativan for possible seizure activity and became more somnolent but did not have improvement in exam.  Was connected to continuous EEG for concern for nonconvulsive seizures, which have not been seen so far.  She has had a recent admission for altered mental status, which was attributed to polypharmacy.   Interval Hx/subjective  Patient's exam has improved somewhat since arrival, with her being able to state her name and intermittently follow simple commands.  She is able to move all 4 extremities with good, symmetrical antigravity strength.  Vitals   Vitals:   02/18/23 0430 02/18/23 0530 02/18/23 0730 02/18/23 0835  BP: (!) 147/100 (!) 153/69 (!) 133/91 (!) 167/79  Pulse: 88 86    Resp: 16 (!) 27 (!) 26 (!) 25  Temp:      TempSrc:      SpO2: 100% 100% 100%   Weight:      Height:         Body mass index is 31.17 kg/m.  Physical Exam   Constitutional: Chronically ill-appearing elderly patient in no acute distress Psych: Affect blunted Eyes: No scleral injection.  HENT: No OP obstrucion.  Head: Normocephalic.  Cardiovascular: Normal rate and regular rhythm.  Respiratory: Effort normal, non-labored breathing.  Skin: WDI.   Neurologic Examination    NEURO:  Mental Status: Patient rests with eyes closed but is able to respond to voice and light touch.  She is able to state her name but  cannot state where she is, often perseverates on being cold.  She is intermittently able to follow simple but not two-step commands. Speech/Language: speech is moderately dysarthric  Cranial Nerves:  II: PERRL.  III, IV, VI: Able to track examiner from side-to-side VII: Face is symmetrical at rest and when speaking VIII: hearing intact to voice. IX, X: Voice is somewhat dysarthric XII: Not cooperative with tongue protrusion Motor: Able to move all 4 extremities with good antigravity strength, unable to perform confrontational strength testing due to inability to consistently follow commands Tone: is normal and bulk is normal Sensation- Intact to light touch bilaterally.  Coordination: Unable to perform Gait- deferred   Medications  Current Facility-Administered Medications:     stroke: early stages of recovery book, , Does not apply, Once, Elgergawy, Leana Roe, MD   0.9 %  sodium chloride infusion, , Intravenous, Continuous, Elgergawy, Leana Roe, MD, Last Rate: 75 mL/hr at 02/17/23 2305, New Bag at 02/17/23 2305   acetaminophen (TYLENOL) tablet 650 mg, 650 mg, Oral, Q4H PRN **OR** acetaminophen (TYLENOL) 160 MG/5ML solution 650 mg, 650 mg, Per Tube, Q4H PRN **OR** acetaminophen (TYLENOL) suppository 650 mg, 650 mg, Rectal, Q4H PRN, Elgergawy, Dawood S, MD   cycloSPORINE (RESTASIS) 0.05 % ophthalmic emulsion 1 drop, 1 drop, Both Eyes, Daily, Elgergawy, Dawood S, MD   haloperidol lactate (HALDOL) injection 2 mg, 2 mg, Intravenous, Once PRN, Niklaus Mamaril L, MD   heparin  injection 5,000 Units, 5,000 Units, Subcutaneous, Q8H, Elgergawy, Leana Roe, MD, 5,000 Units at 02/18/23 0530   hydrALAZINE (APRESOLINE) injection 5 mg, 5 mg, Intravenous, Q6H PRN, [place], Eric J, DO   [COMPLETED] levETIRAcetam (KEPPRA) IVPB 1500 mg/ 100 mL premix, 1,500 mg, Intravenous, Once, Stopped at 02/17/23 2131 **FOLLOWED BY** levETIRAcetam (KEPPRA) IVPB 500 mg/100 mL premix, 500 mg, Intravenous, Q12H, Elgergawy,  Leana Roe, MD, Stopped at 02/18/23 0859   LORazepam (ATIVAN) 2 MG/ML injection, , , ,    senna-docusate (Senokot-S) tablet 1 tablet, 1 tablet, Oral, QHS PRN, Elgergawy, Leana Roe, MD  Current Outpatient Medications:    acetaminophen (TYLENOL) 500 MG tablet, Take 1,000 mg by mouth every 8 (eight) hours as needed for mild pain (pain score 1-3), moderate pain (pain score 4-6) or headache., Disp: , Rfl:    alendronate (FOSAMAX) 70 MG tablet, TAKE 1 TABLET EVERY 7 DAYS ON AN EMPTY STOMACH WITH A FULL GLASS OF WATER (Patient taking differently: Take 70 mg by mouth every Monday.), Disp: 12 tablet, Rfl: 3   aspirin EC 81 MG tablet, Take 1 tablet (81 mg total) by mouth daily with breakfast., Disp: 120 tablet, Rfl: 2   carvedilol (COREG) 12.5 MG tablet, Take 12.5 mg by mouth 2 (two) times daily with a meal., Disp: , Rfl:    Cholecalciferol 10 MCG (400 UNIT) CHEW, Chew 1 tablet by mouth daily., Disp: , Rfl:    Docusate Sodium (DSS) 100 MG CAPS, Take 100 mg by mouth in the morning and at bedtime., Disp: , Rfl:    ezetimibe (ZETIA) 10 MG tablet, TAKE 1 TABLET EVERY DAY, Disp: 90 tablet, Rfl: 10   insulin aspart (NOVOLOG) 100 UNIT/ML injection, Inject 0-15 Units into the skin 3 (three) times daily with meals., Disp: , Rfl:    insulin glargine-yfgn (SEMGLEE) 100 UNIT/ML injection, Inject 0.1 mLs (10 Units total) into the skin at bedtime., Disp: , Rfl:    ketoconazole (NIZORAL) 2 % shampoo, Apply 1 Application topically 2 (two) times a week., Disp: 120 mL, Rfl: 0   lamoTRIgine (LAMICTAL) 150 MG tablet, Take 1 tablet (150 mg total) by mouth 2 (two) times daily., Disp: 60 tablet, Rfl: 0   melatonin 3 MG TABS tablet, Take 1 tablet (3 mg total) by mouth at bedtime as needed. (Patient taking differently: Take 3 mg by mouth at bedtime as needed (for sleep).), Disp: , Rfl:    montelukast (SINGULAIR) 10 MG tablet, Take 10 mg by mouth daily., Disp: , Rfl:    polyethylene glycol (MIRALAX / GLYCOLAX) 17 g packet, Take 17 g by  mouth daily as needed. (Patient taking differently: Take 17 g by mouth daily as needed for mild constipation.), Disp: , Rfl:    pregabalin (LYRICA) 75 MG capsule, Take 1 capsule (75 mg total) by mouth daily., Disp: 30 capsule, Rfl: 0   RABEprazole (ACIPHEX) 20 MG tablet, TAKE 1 TABLET TWICE DAILY, Disp: 180 tablet, Rfl: 0   senna-docusate (SENOKOT-S) 8.6-50 MG tablet, Take 1 tablet by mouth 2 (two) times daily., Disp: , Rfl:    vitamin E 180 MG (400 UNITS) capsule, Take 400 Units by mouth daily., Disp: , Rfl:    amLODipine (NORVASC) 5 MG tablet, Take 1 tablet (5 mg total) by mouth daily., Disp: , Rfl:    Blood Glucose Calibration (ACCU-CHEK GUIDE CONTROL) LIQD, USE AS DIRECTED, Disp: 1 each, Rfl: 0   blood glucose meter kit and supplies, Dispense based on patient and insurance preference. Use up to four times daily  as directed. (FOR ICD-10 E10.9, E11.9)., Disp: 1 each, Rfl: 0   Continuous Blood Gluc Sensor (FREESTYLE LIBRE 14 DAY SENSOR) MISC, 1 each by Does not apply route every 14 (fourteen) days. Change every 2 weeks, Disp: 2 each, Rfl: 11   DROPLET PEN NEEDLES 31G X 8 MM MISC, USE FOR INJECTING INSULIN 4 TIMES DAILY., Disp: 400 each, Rfl: 0   Elastic Bandages & Supports (ADJUSTABLE ARM SLING) MISC, L arm sling, Disp: 1 each, Rfl: 0   glucose blood (ACCU-CHEK GUIDE) test strip, Use as instructed, Disp: 400 strip, Rfl: 3   insulin aspart (NOVOLOG) 100 UNIT/ML injection, Inject 0-5 Units into the skin at bedtime., Disp: , Rfl:    insulin glargine, 2 Unit Dial, (TOUJEO MAX SOLOSTAR) 300 UNIT/ML Solostar Pen, INJECT 20-26 UNITS INTO THE SKIN DAILY., Disp: 12 mL, Rfl: 1   ipratropium-albuterol (DUONEB) 0.5-2.5 (3) MG/3ML SOLN, Take 3 mLs by nebulization every 6 (six) hours as needed., Disp: 360 mL, Rfl: 12   levothyroxine (SYNTHROID) 50 MCG tablet, TAKE 1 TABLET DAILY SIX DAYS A WEEK AND 1/2 TABLET ON ONE DAY A WEEK (Patient taking differently: Take 25-50 mcg by mouth See admin instructions. Take 1  tablet by mouth every Sunday, then take 1/2 tablet all other days), Disp: 85 tablet, Rfl: 2   mirabegron ER (MYRBETRIQ) 50 MG TB24 tablet, Take 1 tablet (50 mg total) by mouth daily., Disp: 30 tablet, Rfl: 5   Misc. Devices (MATTRESS PAD) MISC, FIRM MATTRESS PAD for hospital bed x 1, Disp: 1 each, Rfl: 0   Multiple Vitamins-Minerals (MULTIVITAMIN GUMMIES ADULT PO), Take 1 tablet by mouth daily., Disp: , Rfl:    ondansetron (ZOFRAN) 4 MG tablet, Take one tablet by mouth once daily, as needed , for nausea, Disp: 30 tablet, Rfl: 2   RESTASIS 0.05 % ophthalmic emulsion, Place 2 drops into both eyes daily., Disp: , Rfl:    rosuvastatin (CRESTOR) 5 MG tablet, TAKE 1 TABLET AT BEDTIME, Disp: 90 tablet, Rfl: 3   sertraline (ZOLOFT) 100 MG tablet, Take 1 tablet (100 mg total) by mouth every morning., Disp: 30 tablet, Rfl: 0   UNABLE TO FIND, Med Name:  G5 Mattress, Disp: 1 each, Rfl: 0 Labs and Diagnostic Imaging   CBC:  Recent Labs  Lab 02/17/23 1837  WBC 7.6  NEUTROABS 4.6  HGB 10.3*  HCT 37.2  MCV 78.0*  PLT 251    Basic Metabolic Panel:  Lab Results  Component Value Date   NA 142 02/17/2023   K 3.4 (L) 02/17/2023   CO2 27 02/17/2023   GLUCOSE 108 (H) 02/17/2023   BUN 16 02/17/2023   CREATININE 1.30 (H) 02/17/2023   CALCIUM 9.5 02/17/2023   GFRNONAA 44 (L) 02/17/2023   GFRAA 72 12/11/2019   Lipid Panel:  Lab Results  Component Value Date   LDLCALC 53 06/29/2022   HgbA1c:  Lab Results  Component Value Date   HGBA1C 5.8 (H) 01/13/2023   Urine Drug Screen:     Component Value Date/Time   LABOPIA NONE DETECTED 01/15/2023 1215   COCAINSCRNUR NONE DETECTED 01/15/2023 1215   LABBENZ NONE DETECTED 01/15/2023 1215   AMPHETMU NONE DETECTED 01/15/2023 1215   THCU NONE DETECTED 01/15/2023 1215   LABBARB NONE DETECTED 01/15/2023 1215    Alcohol Level     Component Value Date/Time   ETH <10 02/17/2023 1837   INR  Lab Results  Component Value Date   INR 1.1 02/17/2023    APTT  Lab Results  Component Value Date   APTT 26 02/17/2023   AED levels:  Lab Results  Component Value Date   LAMOTRIGINE 17.4 11/18/2020    CT Head without contrast(Personally reviewed): No acute abnormality  CT angio head and neck: No emergent LVO, moderate stenosis of left V4 segment  MRI Brain(Personally reviewed): Pending  rEEG:  No seizures or epileptiform discharges seen prior to 930, awaiting full report  Assessment   [name] is a 72 y.o. female with history of CKD, bipolar disorder, bifrontal meningioma status post resection, COPD, hypertension, hyperlipidemia, hypothyroidism and seizures who was brought in by EMS from her facility for agitation.  She has had frequent falls, more recently than usual and was noted to be somnolent and unable to follow commands on arrival.  Her exam has improved somewhat with patient able to state her name and intermittently follows simple commands, moving all 4 extremities with antigravity strength.  CT head demonstrated no acute abnormalities, MRI is pending.  She has been connected to a continuous EEG, and no seizures or epileptiform discharges have been seen so far.  Toxic metabolic encephalopathy is also a concern, will need urinalysis and standard workup for this.  Patient was recommended to stop Lyrica after previous admission as polypharmacy was contributing to her altered mental status.  Unable to determine if she is still taking this medication as no MAR was sent from skilled nursing facility and unable to contact them by phone.  Recommendations  -MRI brain without contrast still pending -Continue EEG at this time and she is not fully at her baseline but hope to be able to discontinue tomorrow if the read remains reassuring -Continue Keppra 500 mg twice daily -Restart home Lamictal when able to take p.o. medications -Continue to try to confirm Lyrica discontinuation at her nursing facility -Workup for toxic metabolic  encephalopathy per primary team -If EEG and MRI are reassuring and patient felt to be progressing to her baseline by primary team, neurology will sign off tomorrow morning ______________________________________________________________________  Signed, Cortney Harland Dingwall, NP [facility]   Addendum: On evaluation with attending physician later on in the day, the patient was noted to be drowsy but arouses to voice.  She was able to state her name, and able to converse with simple sentences although she perseverated often.  She exhibited some roving eye movements and was able to follow most simple commands, was able to move bilateral upper extremities and hold them in the air without drift and can move bilateral lower extremities, although she did not lift them off the bed -- in summary continuing to gradually improve  Attending Neurologist's note:  I personally saw this patient, gathering history, performing a full neurologic examination, reviewing relevant labs, personally reviewing relevant imaging including head CT, and formulated the assessment and plan, adding the note above for completeness and clarity to accurately reflect my thoughts

## 2023-02-18 NOTE — Progress Notes (Signed)
EEG attempted, pt fighting with whole body. Pt rollin up into ball, sitting up in bed and twisting, and grabbing techs hands and arms through gloves. Discussed options with RN, order may need to wait for morning when staffing is available. Discussing with Neuro

## 2023-02-18 NOTE — Evaluation (Signed)
Clinical/Bedside Swallow Evaluation Patient Details  Name: [name] MRN: [id number] Date of Birth: 09-04-1950  Today's Date: 02/18/2023 Time: SLP Start Time (ACUTE ONLY): 1318 SLP Stop Time (ACUTE ONLY): 1324 SLP Time Calculation (min) (ACUTE ONLY): 6 min  Past Medical History:  Past Medical History:  Diagnosis Date   Allergy    Anemia    Anemia in chronic kidney disease (CKD) 08/10/2021   Anemia in chronic kidney disease (CKD) 10/12/2021   Anxiety    takes Ativan daily   Arthritis    Assistance needed for mobility    Bipolar disorder (HCC)    takes Risperdal nightly   Blood transfusion    Brain tumor (HCC)    Cancer (HCC)    In her gum   Carpal tunnel syndrome of right wrist 05/23/2011   Cervical disc disorder with radiculopathy of cervical region 10/31/2012   Chronic back pain    Chronic idiopathic constipation    Chronic neck and back pain    Colon polyps    COPD (chronic obstructive pulmonary disease) with chronic bronchitis (HCC) 09/16/2013   Office Spirometry 10/30/2013-submaximal effort based on appearance of loop and curve. Numbers would fit with severe restriction but her physiologic capability may be better than this. FVC 0.91/44%, and 10.74/45%, FEV1/FVC 0.81, FEF 25-75% 1.43/69%     Diabetes mellitus    Type II   Diverticulosis    TCS 9/08 by Dr. Lina Sar for diarrhea . Bx for micro scopic colitis negative.    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   Metabolic  encephalopathy 08/03/2011   Migraines    chronic headaches   Mononeuritis lower limb    Narcolepsy    Osteoporosis    Pancreatitis 2006   due to Depakote with normal EUS    Paralysis (HCC)    Pneumonia    Schatzki's ring    non critical / EGD with ED 8/2011with RMR   Seizures (HCC)    takes Lamictal daily.Last seizure 3 yrs ago   Sleep apnea    on CPAP   Small bowel obstruction (HCC)    Stroke (HCC)    left sided weakness, speech changes   Tubular adenoma of colon    Past Surgical History:  Past Surgical History:  Procedure Laterality Date   ABDOMINAL HYSTERECTOMY  1978   BACK SURGERY  July 2012   BACTERIAL OVERGROWTH TEST N/A 05/05/2013   Procedure: BACTERIAL OVERGROWTH TEST;  Surgeon: Corbin Ade, MD;  Location: [facility];  Service: Endoscopy;  Laterality: N/A;  7:30   BIOPSY THYROID  2009   BRAIN SURGERY  11/2011   resection of meningioma   BREAST REDUCTION SURGERY  1994   CARDIAC CATHETERIZATION  05/10/2005   normal coronaries, normal LV systolic function and EF (Dr. Evlyn Courier)   CARPAL TUNNEL RELEASE Left 07/22/04   Dr. Romeo Apple   CATARACT EXTRACTION Bilateral  CHOLECYSTECTOMY  1984   COLONOSCOPY N/A 09/25/2012   [id number] diverticulosis.  colonic polyp-removed : tubular adenoma   CRANIOTOMY  11/23/2011   Procedure: CRANIOTOMY TUMOR EXCISION;  Surgeon: Hewitt Shorts, MD;  Location: [facility];  Service: Neurosurgery;  Laterality: N/A;  Craniotomy for tumor resection   ESOPHAGOGASTRODUODENOSCOPY  12/29/2010   Rourk-Retained food in the esophagus and stomach, small hiatal hernia, status post Maloney dilation of the esophagus   ESOPHAGOGASTRODUODENOSCOPY N/A 09/25/2012   [id number] atonic baggy esophagus status post Maloney dilation 56 F. Hiatal hernia   GIVENS CAPSULE STUDY N/A 01/15/2013   NORMAL.    IR GENERIC HISTORICAL  03/17/2016   IR RADIOLOGIST EVAL & MGMT 03/17/2016 [facility]   LESION REMOVAL N/A 05/31/2015   Procedure: REMOVAL RIGHT AND LEFT  LESIONS OF MANDIBLE;  Surgeon: Ocie Doyne, DDS;  Location: [facility] OR;  Service: Oral Surgery;  Laterality: N/A;   MALONEY DILATION  12/29/2010   RMR;   NM MYOCAR PERF WALL MOTION  2006   "relavtiely normal" persantine, mild anterior thinning (breast attenuation artifact), no region of scar/ischemia   OVARIAN CYST REMOVAL     RECTOCELE REPAIR N/A 06/29/2015   Procedure: POSTERIOR REPAIR (RECTOCELE);  Surgeon: Tilda Burrow, MD;  Location: [facility]S;  Service: Gynecology;  Laterality: N/A;   REDUCTION MAMMAPLASTY Bilateral    REVERSE SHOULDER ARTHROPLASTY Left 08/18/2022   Procedure: REVERSE SHOULDER ARTHROPLASTY;  Surgeon: Beverely Low, MD;  Location: [facility];  Service: Orthopedics;  Laterality: Left;  choice with interscalene, general   SPINE SURGERY  09/29/2010   Dr. Shon Baton   surgical excision of 3 tumors from right thigh and right buttock  and left upper thigh  2010   TOOTH EXTRACTION Bilateral 12/14/2014   Procedure: REMOVAL OF BILATERAL MANDIBULAR EXOSTOSES;  Surgeon: Ocie Doyne, DDS;  Location: [facility] OR;  Service: Oral Surgery;  Laterality: Bilateral;   TRANSTHORACIC ECHOCARDIOGRAM  2010   EF 60-65%, mild conc LVH, grade 1 diastolic dysfunction; mildly calcified MV annulus with mildly thickened leaflets, mildly calcified MR annulus   HPI:  [name]  a 72 year old woman who was brought in by EMS from facility for agitation. Head CT 12/7 with no acute findings. No chest imaging.  Pt with a history of CKD, bipolar disorder, bifrontal meningioma status postresection, COPD, hyperlipidemia, hypertension, hypothyroidism, seizure disorder, GERD.    Assessment / Plan / Recommendation  Clinical Impression  Pt presents with an oral dysphagia which is presumed to be 2/2 AMS.  SLP repositioned pt on stretcher on arrival to sit upright with HOB raised.  Pt appears to be edentulous with dry appearance to dorsal surface of tongue, but pt would not allow for visualization of oral cavity.  Vocal quality  harsh/strained at baseline. Pt did not accept trials of ice chips or thin liquids today.  She did not siphon from straw.  She kept her lips sealed closed and SLP could not place water by spoon in oral cavity.  With thermal stimulation to lips with ice chip pt protruded tongue but would not accept bolus trials.  Cannot make safe diet recommendation at this time.  SLP will follow for PO readiness.   Recommend pt remain NPO with alternate means of medicaiton administration.   SLP Visit Diagnosis: Dysphagia, oral phase (R13.11)    Aspiration Risk  Mild aspiration risk    Diet Recommendation NPO    Medication Administration: Via alternative means    Other  Recommendations      Recommendations for follow  up therapy are one component of a multi-disciplinary discharge planning process, led by the attending physician.  Recommendations may be updated based on patient status, additional functional criteria and insurance authorization.  Follow up Recommendations  (at present, continue ST at next level of care)      Assistance Recommended at Discharge    Functional Status Assessment Patient has had a recent decline in their functional status and demonstrates the ability to make significant improvements in function in a reasonable and predictable amount of time.  Frequency and Duration min 2x/week  2 weeks       Prognosis Prognosis for improved oropharyngeal function: Good (with continued recovery)      Swallow Study   General Date of Onset: 02/17/23 HPI: [name]  a 72 year old woman who was brought in by EMS from facility for agitation. Head CT 12/7 with no acute findings. No chest imaging.  Pt with a history of CKD, bipolar disorder, bifrontal meningioma status postresection, COPD, hyperlipidemia, hypertension, hypothyroidism, seizure disorder, GERD. Type of Study: Bedside Swallow Evaluation Previous Swallow Assessment: 2018 at [facility], clinical management Diet Prior to this Study:  NPO Temperature Spikes Noted: No Respiratory Status: Nasal cannula History of Recent Intubation: No Behavior/Cognition: Alert;Requires cueing Oral Cavity Assessment: Dry Oral Care Completed by SLP: No Oral Cavity - Dentition: Edentulous Self-Feeding Abilities: Refused PO Baseline Vocal Quality: Hoarse (Harsh) Volitional Cough: Cognitively unable to elicit Volitional Swallow: Unable to elicit    Oral/Motor/Sensory Function Overall Oral Motor/Sensory Function:  (Unable to assess. ?slight L facial droop)   Ice Chips Ice chips: Impaired Other Comments: protruded tongue to thermal stimulation to lips   Thin Liquid Thin Liquid: Impaired Other Comments: did not accept    Nectar Thick Nectar Thick Liquid: Not tested   Honey Thick Honey Thick Liquid: Not tested   Puree Puree: Not tested   Solid     Solid: Not tested      Kerrie Pleasure, MA, CCC-SLP Acute Rehabilitation Services Office: [phone] 02/18/2023,2:14 PM

## 2023-02-18 NOTE — Progress Notes (Signed)
EEG retried twice after first and second round of ativan. No noticeable change in pt behavior. Some leads placed. MRI leads used, pc running in room, should be reattempted with multiple techs

## 2023-02-18 NOTE — ED Notes (Signed)
IP provider at bedside.

## 2023-02-19 ENCOUNTER — Inpatient Hospital Stay (HOSPITAL_COMMUNITY): Payer: Medicare HMO

## 2023-02-19 DIAGNOSIS — G9341 Metabolic encephalopathy: Secondary | ICD-10-CM | POA: Diagnosis not present

## 2023-02-19 DIAGNOSIS — I639 Cerebral infarction, unspecified: Secondary | ICD-10-CM | POA: Diagnosis not present

## 2023-02-19 DIAGNOSIS — N3000 Acute cystitis without hematuria: Secondary | ICD-10-CM

## 2023-02-19 DIAGNOSIS — Z8673 Personal history of transient ischemic attack (TIA), and cerebral infarction without residual deficits: Secondary | ICD-10-CM | POA: Diagnosis not present

## 2023-02-19 DIAGNOSIS — G9389 Other specified disorders of brain: Secondary | ICD-10-CM | POA: Diagnosis not present

## 2023-02-19 DIAGNOSIS — I6782 Cerebral ischemia: Secondary | ICD-10-CM | POA: Diagnosis not present

## 2023-02-19 DIAGNOSIS — R569 Unspecified convulsions: Secondary | ICD-10-CM | POA: Diagnosis not present

## 2023-02-19 DIAGNOSIS — R296 Repeated falls: Secondary | ICD-10-CM

## 2023-02-19 LAB — BASIC METABOLIC PANEL
Anion gap: 14 (ref 5–15)
BUN: 9 mg/dL (ref 8–23)
CO2: 21 mmol/L — ABNORMAL LOW (ref 22–32)
Calcium: 8.5 mg/dL — ABNORMAL LOW (ref 8.9–10.3)
Chloride: 107 mmol/L (ref 98–111)
Creatinine, Ser: 0.84 mg/dL (ref 0.44–1.00)
GFR, Estimated: >60 mL/min (ref >60)
Glucose, Bld: 111 mg/dL — ABNORMAL HIGH (ref 70–99)
Potassium: 3.1 mmol/L — ABNORMAL LOW (ref 3.5–5.1)
Sodium: 142 mmol/L (ref 135–145)

## 2023-02-19 LAB — CBC
HCT: 32.2 % — ABNORMAL LOW (ref 36.0–46.0)
Hemoglobin: 9.3 g/dL — ABNORMAL LOW (ref 12.0–15.0)
MCH: 22.2 pg — ABNORMAL LOW (ref 26.0–34.0)
MCHC: 28.9 g/dL — ABNORMAL LOW (ref 30.0–36.0)
MCV: 77 fL — ABNORMAL LOW (ref 80.0–100.0)
Platelets: 220 10*3/uL (ref 150–400)
RBC: 4.18 MIL/uL (ref 3.87–5.11)
RDW: 16.3 % — ABNORMAL HIGH (ref 11.5–15.5)
WBC: 10.7 10*3/uL — ABNORMAL HIGH (ref 4.0–10.5)
nRBC: 0 % (ref 0.0–0.2)

## 2023-02-19 LAB — MAGNESIUM: Magnesium: 1.8 mg/dL (ref 1.7–2.4)

## 2023-02-19 MED ORDER — LEVETIRACETAM 500 MG PO TABS
500.0000 mg | ORAL_TABLET | Freq: Two times a day (BID) | ORAL | Status: DC
  Filled 2023-02-19: qty 1

## 2023-02-19 MED ORDER — LAMOTRIGINE 25 MG PO TABS
150.0000 mg | ORAL_TABLET | Freq: Two times a day (BID) | ORAL | Status: DC
  Administered 2023-02-19 – 2023-02-27 (×13): 150 mg via ORAL
  Filled 2023-02-19 (×15): qty 2

## 2023-02-19 MED ORDER — AMLODIPINE BESYLATE 5 MG PO TABS
5.0000 mg | ORAL_TABLET | Freq: Every day | ORAL | Status: DC
Start: 2023-02-19 — End: 2023-02-23
  Administered 2023-02-19 – 2023-02-22 (×4): 5 mg via ORAL
  Filled 2023-02-19 (×6): qty 1

## 2023-02-19 MED ORDER — CIPROFLOXACIN IN D5W 400 MG/200ML IV SOLN
400.0000 mg | Freq: Two times a day (BID) | INTRAVENOUS | Status: AC
  Administered 2023-02-19 – 2023-02-21 (×6): 400 mg via INTRAVENOUS
  Filled 2023-02-19 (×6): qty 200

## 2023-02-19 MED ORDER — MAGNESIUM SULFATE 2 GM/50ML IV SOLN
2.0000 g | Freq: Once | INTRAVENOUS | Status: AC
  Administered 2023-02-19: 2 g via INTRAVENOUS
  Filled 2023-02-19: qty 50

## 2023-02-19 MED ORDER — POTASSIUM CHLORIDE 20 MEQ PO PACK
40.0000 meq | PACK | ORAL | Status: AC
  Administered 2023-02-19 (×2): 40 meq via ORAL
  Filled 2023-02-19 (×2): qty 2

## 2023-02-19 MED ORDER — POTASSIUM CHLORIDE 10 MEQ/100ML IV SOLN: 10.0000 meq | INTRAVENOUS | Status: DC

## 2023-02-19 MED ORDER — MAGNESIUM OXIDE -MG SUPPLEMENT 400 (240 MG) MG PO TABS
400.0000 mg | ORAL_TABLET | Freq: Two times a day (BID) | ORAL | Status: DC
  Filled 2023-02-19: qty 1

## 2023-02-19 MED ORDER — SERTRALINE HCL 100 MG PO TABS
100.0000 mg | ORAL_TABLET | Freq: Every morning | ORAL | Status: DC
  Administered 2023-02-20 – 2023-02-27 (×6): 100 mg via ORAL
  Filled 2023-02-19 (×9): qty 1

## 2023-02-19 MED ORDER — CIPROFLOXACIN HCL 500 MG PO TABS
500.0000 mg | ORAL_TABLET | Freq: Two times a day (BID) | ORAL | Status: DC
  Filled 2023-02-19: qty 1

## 2023-02-19 MED ORDER — BUSPIRONE HCL 10 MG PO TABS
5.0000 mg | ORAL_TABLET | Freq: Three times a day (TID) | ORAL | Status: DC
  Administered 2023-02-19 – 2023-02-22 (×9): 5 mg via ORAL
  Filled 2023-02-19 (×12): qty 1

## 2023-02-19 MED ORDER — LEVETIRACETAM 500 MG PO TABS
500.0000 mg | ORAL_TABLET | Freq: Two times a day (BID) | ORAL | Status: DC
  Administered 2023-02-19 – 2023-02-20 (×2): 500 mg via ORAL
  Filled 2023-02-19 (×3): qty 1

## 2023-02-19 MED ORDER — CIPROFLOXACIN IN D5W 400 MG/200ML IV SOLN
400.0000 mg | Freq: Two times a day (BID) | INTRAVENOUS | Status: DC
  Administered 2023-02-19: 400 mg via INTRAVENOUS
  Filled 2023-02-19: qty 200

## 2023-02-19 MED ORDER — POTASSIUM CHLORIDE IN NACL 20-0.9 MEQ/L-% IV SOLN: INTRAVENOUS | Status: DC

## 2023-02-19 MED ORDER — CARVEDILOL 12.5 MG PO TABS
12.5000 mg | ORAL_TABLET | Freq: Two times a day (BID) | ORAL | Status: DC
  Administered 2023-02-19 – 2023-02-22 (×5): 12.5 mg via ORAL
  Filled 2023-02-19 (×8): qty 1

## 2023-02-19 MED ORDER — GUAIFENESIN-DM 100-10 MG/5ML PO SYRP: 5.0000 mL | ORAL_SOLUTION | ORAL | Status: DC | PRN

## 2023-02-19 MED ORDER — MAGNESIUM SULFATE 2 GM/50ML IV SOLN: 2.0000 g | Freq: Once | INTRAVENOUS | Status: DC

## 2023-02-19 MED ORDER — ALUM & MAG HYDROXIDE-SIMETH 200-200-20 MG/5ML PO SUSP
30.0000 mL | ORAL | Status: DC | PRN
  Administered 2023-02-19: 30 mL via ORAL
  Filled 2023-02-19: qty 30

## 2023-02-19 NOTE — Progress Notes (Signed)
Speech Language Pathology Treatment: Dysphagia  Patient Details Name: [name] MRN: [id number] DOB: 03/04/1951 Today's Date: 02/19/2023 Time: 4098-1191 SLP Time Calculation (min) (ACUTE ONLY): 7 min  Assessment / Plan / Recommendation Clinical Impression  Pt seen for ongoing dysphagia management. She was alert and positioned upright in bed.  Pt exhibited good tolerance of thin liquid by straw.  There was a delayed cough x1 which pt denied was related to swallowing and she continued with additional trials with no further s/s of aspiration.  Pt tolerated puree by spoon.  She declined solid trials, even when soft solids were offered.  Pt is edentulous and her dentures are not available.  SLP will follow for tolerance and possible advancement.  Recommend puree texture diet with thin liquids.    HPI HPI: Lakiya Maccini  a 72 year old woman who was brought in by EMS from facility for agitation. Head CT 12/7 with no acute findings. No chest imaging.  Pt with a history of CKD, bipolar disorder, bifrontal meningioma status postresection, COPD, hyperlipidemia, hypertension, hypothyroidism, seizure disorder, GERD.      SLP Plan  Continue with current plan of care      Recommendations for follow up therapy are one component of a multi-disciplinary discharge planning process, led by the attending physician.  Recommendations may be updated based on patient status, additional functional criteria and insurance authorization.    Recommendations  Diet recommendations: Dysphagia 1 (puree);Thin liquid Medication Administration:  (As tolerated, can crush if needed) Supervision: Staff to assist with self feeding Compensations: Slow rate;Small sips/bites Postural Changes and/or Swallow Maneuvers: Seated upright 90 degrees                  Oral care BID     Dysphagia, oral phase (R13.11)     Continue with current plan of care     Kerrie Pleasure, MA, CCC-SLP Acute Rehabilitation  Services Office: [phone] 02/19/2023, 9:56 AM

## 2023-02-19 NOTE — Evaluation (Signed)
Occupational Therapy Evaluation Patient Details Name: [name] MRN: [id number] DOB: 11/07/1950 Today's Date: 02/19/2023   History of Present Illness Pt is a 72 y.o. female presenting 12/07 from [facility] after fall and subsequent AMS/agitation. EEG with no seizure or epileptiform discharges. CT head negative. PMHx: HTN, HLD, GERD, COPD, hypothyroidism, seizures, CVA, T2DM, glaucoma, PVD, PAF, PPM, CKD, bipolar disorder, bifrontal meningioma status postresection   Clinical Impression   Shaneil was evaluated s/p the above admission list. She is from LTC SNF with unknown PLOF at baseline, however per the chart she is typically ambulatory with AD. Upon evaluation the pt was limited by restlessness, confusion, perseveration, weakness, poor standing tolerance, unsteady gait and incontinence. Overall she needed CGA with increased time for bed mobility, min A to stand with RW and maximal cues to take side steps towards the [facility]. Due to the deficits listed below the pt also needs up to max A for LB ADLs and mod A for UB ADLs. Pt will benefit from continued acute OT services and skilled inpatient follow up therapy, <3 hours/day.        If plan is discharge home, recommend the following: A lot of help with walking and/or transfers;A lot of help with bathing/dressing/bathroom;Assistance with cooking/housework;Assistance with feeding;Direct supervision/assist for financial management;Direct supervision/assist for medications management;Assist for transportation;Help with stairs or ramp for entrance    Functional Status Assessment  Patient has had a recent decline in their functional status and demonstrates the ability to make significant improvements in function in a reasonable and predictable amount of time.  Equipment Recommendations  None recommended by OT       Precautions / Restrictions Precautions Precautions: Fall Precaution Comments: EEG Restrictions Weight Bearing Restrictions: No       Mobility Bed Mobility Overal bed mobility: Needs Assistance Bed Mobility: Supine to Sit, Sit to Supine     Supine to sit: Contact guard Sit to supine: Contact guard assist   General bed mobility comments: CGA for lines as pt with decr awareness of lines    Transfers Overall transfer level: Needs assistance Equipment used: Rolling walker (2 wheels) Transfers: Sit to/from Stand Sit to Stand: Min assist           General transfer comment: light min A to come to stand x2 during session, pt able to take a few side steps toward [facility] with max tactile cues for each step      Balance Overall balance assessment: Needs assistance Sitting-balance support: No upper extremity supported Sitting balance-Leahy Scale: Fair     Standing balance support: During functional activity, Reliant on assistive device for balance, Bilateral upper extremity supported Standing balance-Leahy Scale: Poor                 ADL either performed or assessed with clinical judgement   ADL Overall ADL's : Needs assistance/impaired Eating/Feeding: Minimal assistance   Grooming: Minimal assistance   Upper Body Bathing: Moderate assistance   Lower Body Bathing: Maximal assistance;Sit to/from stand   Upper Body Dressing : Moderate assistance   Lower Body Dressing: Maximal assistance;Sit to/from stand   Toilet Transfer: Moderate assistance;Stand-pivot;BSC/3in1   Toileting- Clothing Manipulation and Hygiene: Maximal assistance;Sit to/from stand       Functional mobility during ADLs: Moderate assistance General ADL Comments: needed significant cues throughout, limited by confusion, restlessness, weakness and poor standing tolerance     Vision Baseline Vision/History: 1 Wears glasses Vision Assessment?: No apparent visual deficits Additional Comments: didnot formally assess, pt attending  in all fields     Perception Perception: Not tested       Praxis Praxis: Not tested        Pertinent Vitals/Pain Pain Assessment Pain Assessment: Faces Faces Pain Scale: No hurt Pain Intervention(s): Monitored during session     Extremity/Trunk Assessment Upper Extremity Assessment Upper Extremity Assessment: Generalized weakness RUE Deficits / Details: difficult to asssess LUE Deficits / Details: difficult to asssess   Lower Extremity Assessment Lower Extremity Assessment: Defer to PT evaluation   Cervical / Trunk Assessment Cervical / Trunk Assessment: Kyphotic   Communication Communication Communication: No apparent difficulties   Cognition Arousal: Alert Behavior During Therapy: Flat affect Overall Cognitive Status: No family/caregiver present to determine baseline cognitive functioning Area of Impairment: Attention, Following commands, Awareness, Safety/judgement, Problem solving, Orientation                 Orientation Level: Disoriented to, Place, Time, Situation Current Attention Level: Focused   Following Commands: Follows one step commands inconsistently Safety/Judgement: Decreased awareness of deficits Awareness: Intellectual Problem Solving: Requires verbal cues, Slow processing General Comments: Pt able to state name, nodded "yes" to location but unable to recall. Perseverating on "I need to find my walker." Restless but follows most simple commands     General Comments  Pt noted to pull out her IV at the end of the session, RN made aware.            Home Living Family/patient expects to be discharged to:: Skilled nursing facility               Additional Comments: from [facility] LTC per chart      Prior Functioning/Environment Prior Level of Function : Needs assist;Patient poor historian/Family not available             Mobility Comments: ambulate with rollator vs RW per previous medical record ADLs Comments: pt unable to report ADL LOC PTA        OT Problem List: Decreased strength;Decreased range of  motion;Decreased activity tolerance;Impaired balance (sitting and/or standing);Decreased cognition;Decreased knowledge of use of DME or AE;Decreased safety awareness         OT Goals(Current goals can be found in the care plan section) Acute Rehab OT Goals Patient Stated Goal: unable to state OT Goal Formulation: With patient Time For Goal Achievement: 02/06/23 Potential to Achieve Goals: Good ADL Goals Pt Will Perform Grooming: with set-up;sitting Pt Will Perform Upper Body Dressing: with set-up;sitting Pt Will Perform Lower Body Dressing: with min assist;sit to/from stand Pt Will Transfer to Toilet: with min assist;ambulating  OT Frequency: Min 1X/week    Co-evaluation PT/OT/SLP Co-Evaluation/Treatment: Yes Reason for Co-Treatment: To address functional/ADL transfers PT goals addressed during session: Mobility/safety with mobility;Balance;Proper use of DME OT goals addressed during session: ADL's and self-care      AM-PAC OT "6 Clicks" Daily Activity     Outcome Measure Help from another person eating meals?: A Little Help from another person taking care of personal grooming?: A Little Help from another person toileting, which includes using toliet, bedpan, or urinal?: A Lot Help from another person bathing (including washing, rinsing, drying)?: A Lot Help from another person to put on and taking off regular upper body clothing?: A Lot Help from another person to put on and taking off regular lower body clothing?: A Lot 6 Click Score: 14   End of Session Equipment Utilized During Treatment: Rolling walker (2 wheels) Nurse Communication: Mobility status  Activity Tolerance: Patient tolerated  treatment well Patient left: in bed;with call bell/phone within reach;with bed alarm set;with family/visitor present  OT Visit Diagnosis: Unsteadiness on feet (R26.81);Other abnormalities of gait and mobility (R26.89);Repeated falls (R29.6);Muscle weakness (generalized) (M62.81);History of  falling (Z91.81);Dizziness and giddiness (R42)                Time: 4098-1191 OT Time Calculation (min): 16 min Charges:  OT General Charges $OT Visit: 1 Visit OT Evaluation $OT Eval Moderate Complexity: 1 Mod  Derenda Mis, OTR/L Acute Rehabilitation Services Office [phone] Secure Chat Communication Preferred   Donia Pounds 02/19/2023, 11:11 AM

## 2023-02-19 NOTE — Procedures (Signed)
Patient Name: [name]  MRN: [id number]  Epilepsy Attending: Charlsie Quest  Referring Physician/Provider: Erick Blinks, MD  Duration: 02/18/2023 0758 to 02/19/2023 1459  Patient history: 72yo F with h/o epilepsy now with ams getting eeg to evaluate for seizure  Level of alertness: Awake, asleep  AEDs during EEG study: LEV  Technical aspects: This EEG study was done with scalp electrodes positioned according to the 10-20 International system of electrode placement. Electrical activity was reviewed with band pass filter of 1-70Hz , sensitivity of 7 uV/mm, display speed of 92mm/sec with a 60Hz  notched filter applied as appropriate. EEG data were recorded continuously and digitally stored.  Video monitoring was available and reviewed as appropriate.  Description: The posterior dominant rhythm consists of 9-10 Hz activity of moderate voltage (25-35 uV) seen predominantly in posterior head regions, symmetric and reactive to eye opening and eye closing. Sleep was characterized by vertex waves, sleep spindles (12 to 14 Hz), maximal frontocentral region.  Hyperventilation and photic stimulation were not performed.     IMPRESSION: This study is within normal limits. No seizures or epileptiform discharges were seen throughout the recording.  A normal interictal EEG does not exclude the diagnosis of epilepsy.   Jereld Presti Annabelle Harman

## 2023-02-19 NOTE — Plan of Care (Signed)
  Problem: Education: Goal: Knowledge of disease or condition will improve Outcome: Progressing   Problem: Ischemic Stroke/TIA Tissue Perfusion: Goal: Complications of ischemic stroke/TIA will be minimized Outcome: Progressing   Problem: Coping: Goal: Will verbalize positive feelings about self Outcome: Progressing   Problem: Health Behavior/Discharge Planning: Goal: Ability to manage health-related needs will improve Outcome: Progressing   Problem: Clinical Measurements: Goal: Diagnostic test results will improve Outcome: Progressing Goal: Respiratory complications will improve Outcome: Progressing Goal: Cardiovascular complication will be avoided Outcome: Progressing   Problem: Activity: Goal: Risk for activity intolerance will decrease Outcome: Progressing   Problem: Education: Goal: Knowledge of secondary prevention will improve (MUST DOCUMENT ALL) Outcome: Not Progressing Goal: Knowledge of patient specific risk factors will improve [name] N/A or DELETE if not current risk factor) Outcome: Not Progressing   Problem: Coping: Goal: Will identify appropriate support needs Outcome: Not Progressing   Problem: Coping: Goal: Level of anxiety will decrease Outcome: Not Progressing

## 2023-02-19 NOTE — Progress Notes (Addendum)
PROGRESS NOTE    [name]  GUY:[id number] DOB: 1951/02/17 DOA: 02/17/2023 PCP: Kerri Perches, MD    Brief Narrative:   [name] is a 72 y.o. female with past medical history significant for HTN, HLD, DM2, history of CVA, COPD, hypothyroidism, seizure disorder, history of meningioma s/p resection, bipolar disorder, fibromyalgia, osteoarthritis, diverticulosis, GERD, chronic anemia, OSA on CPAP with recent hospitalization due to encephalopathy, acute renal failure and seizure subsequent discharge to SNF who presented to [facility] ED on 12/7 via EMS for altered mental status.  Patient was currently obtunded, encephalopathic and history obtained from records, consultants and staff.  On arrival code stroke was initiated.  Apparently last known well approximately 4 PM this afternoon.  At baseline usually oriented x 2, ambulatory with a walker/cane.  Patient was transported the ED for further evaluation and management per EMS.  In the ED, temperature 98.4 F, HR 80, RR 22, BP 146/68, SpO2 94% on room air.  WBC 7.6, hemoglobin 10.3, platelet count 251.  Sodium 142, potassium 3.4, chloride 106, CO2 27, BUN 16, creat 1.30, glucose 108.  AST 19, ALT 14, total bilirubin 0.5.  INR 1.1.  EtOH level less than 10.  CT head without contrast with no hemorrhage or CT evidence of acute cortical infarct, postsurgical encephalomalacia along the medial frontal lobes bilaterally.  CT angiogram head/neck with no emergent large vessel occlusion, moderate stenosis left V4 segment.  Neurology was consulted.  She was deemed high risk for TNK and received Ativan for concern of seizures.  She was loaded with Keppra.  Received aspirin per rectum.  TRH consulted for admission and patient was transferred ED to ED to [facility] for LTM EEG and neurology evaluation.  Assessment & Plan:   Acute metabolic encephalopathy Nonconvulsive seizure Concern for acute CVA: Ruled out Patient presenting from SNF  with change in mental status, agitation.  Last known normal 4 PM day of ED arrival.  Initially code stroke was initiated with CT head and CT angiogram head/neck unrevealing.  Also concern of breakthrough seizures and patient was given Ativan and loaded with Keppra.  EEG with no seizures or epileptiform discharges noted.  Neurology was consulted and followed during hospital course.  EEG discontinued 12/9.  MRI brain without contrast negative for acute infarct. -- Keppra 500 mg PO q12h -- Lamictal 150 mg p.o. twice daily -- Seizure precautions  UTI Urinalysis with moderate leukocytes, negative nitrite, rare bacteria, 11-20 WBCs.  WBC count elevated 10.7. -- Urine culture: Not collected -- Ciprofloxacin 400 mg IV every 12 hours (allergy to penicillin/cephalosporins)  Hypokalemia Potassium 3.1, magnesium 1.8; will replete -- Repeat electrolytes in a.m.  Essential hypertension -- amlodipine 5 mg p.o. daily, carvedilol 12.5 m p.o. twice daily  Hyperlipidemia -- Hold home Zetia and Crestor for now  DM2 Home regimen includes Semglee 10 units Cullen daily.  Hemoglobin C 5.8 01/13/2023. -- Hold insulin -- Continue monitor glucose intermittently  COPD Stable, on room air -- Albuterol neb every 4 hours as needed shortness of breath/wheezing  Hypothyroidism On levothyroxine 50 mcg p.o. daily. -- Hold until speech swallow evaluation  Chronic anemia Hemoglobin 10.3, stable.  Anxiety/depression -- Sertraline 100 mg p.o. daily -- BuSpar 5 mg p.o. 3 times daily -- Hold home Ativan for now    DVT prophylaxis: heparin injection 5,000 Units Start: 02/17/23 2200    Code Status: Full Code Family Communication: No family present at bedside this morning  Disposition Plan:  Level of  care: Telemetry Medical Status is: Inpatient Remains inpatient appropriate because: Continuous EEG, pending PT/OT/SLP evaluation, further neurology recommendations needs initiation of diet with toleration, pending MR  brain    Consultants:  Neurology  Procedures:  EEG  Antimicrobials:  None   Subjective: Patient seen examined bedside, lying in bed.  Remains on continuous EEG.  Pleasantly confused.  Following simple commands.  Denies pain, no shortness of breath, no chest pain, no abdominal pain.  Discussed with neurology, will discontinue EEG today and recommend restart Lamictal.  Patient started on IV antibiotics for concern of urinary tract infection given urinalysis findings. No family present.  Unable to gain any further ROS from patient due to her confusion.  Objective: Vitals:   02/18/23 2346 02/19/23 0333 02/19/23 0920 02/19/23 1130  BP: (!) 154/57 (!) 170/80 (!) 161/70 (!) 126/57  Pulse: 94  93 97  Resp: 19 18 17 17   Temp: 98.1 F (36.7 C) 98.3 F (36.8 C) 99 F (37.2 C) 98.1 F (36.7 C)  TempSrc: Oral Oral Oral Axillary  SpO2: 100% 100% 100% 100%  Weight:      Height:        Intake/Output Summary (Last 24 hours) at 02/19/2023 1322 Last data filed at 02/19/2023 1000 Gross per 24 hour  Intake 245.5 ml  Output 750 ml  Net -504.5 ml   Filed Weights   02/17/23 1837  Weight: 70 kg    Examination:  Physical Exam: GEN: NAD, alert, confused, restless, chronically ill in appearance, appears older than stated age HEENT: NCAT, PERRL, EOMI, sclera clear, dry mucous membranes PULM: CTAB w/o wheezes/crackles, normal respiratory effort, on room air CV: RRR w/o M/G/R GI: abd soft, NTND, + BS MSK: no peripheral edema, moves all extremities independently NEURO: CN II-XII intact, no focal neurological deficit appreciated Integumentary: no concerning rashes/lesions/wounds noted on exposed skin surfaces    Data Reviewed: I have personally reviewed following labs and imaging studies  CBC: Recent Labs  Lab 02/17/23 1837 02/18/23 1553 02/19/23 0627  WBC 7.6 9.3 10.7*  NEUTROABS 4.6  --   --   HGB 10.3* 10.0* 9.3*  HCT 37.2 35.2* 32.2*  MCV 78.0* 76.9* 77.0*  PLT 251 226 220    Basic Metabolic Panel: Recent Labs  Lab 02/17/23 1837 02/18/23 1553 02/19/23 0627  NA 142 144 142  K 3.4* 3.2* 3.1*  CL 106 108 107  CO2 27 23 21*  GLUCOSE 108* 127* 111*  BUN 16 9 9   CREATININE 1.30* 0.87 0.84  CALCIUM 9.5 8.9 8.5*  MG  --   --  1.8   GFR: Estimated Creatinine Clearance: 51.5 mL/min (by C-G formula based on SCr of 0.84 mg/dL). Liver Function Tests: Recent Labs  Lab 02/17/23 1837  AST 19  ALT 14  ALKPHOS 70  BILITOT 0.5  PROT 7.5  ALBUMIN 4.2   No results for input(s): "LIPASE", "AMYLASE" in the last 168 hours. No results for input(s): "AMMONIA" in the last 168 hours. Coagulation Profile: Recent Labs  Lab 02/17/23 1837  INR 1.1   Cardiac Enzymes: No results for input(s): "CKTOTAL", "CKMB", "CKMBINDEX", "TROPONINI" in the last 168 hours. BNP (last 3 results) No results for input(s): "PROBNP" in the last 8760 hours. HbA1C: Recent Labs    02/18/23 1553  HGBA1C 5.2   CBG: Recent Labs  Lab 02/17/23 1833 02/18/23 0049 02/18/23 0507 02/18/23 0742  GLUCAP 89 93 107* 113*   Lipid Profile: Recent Labs    02/18/23 1553  CHOL 102  HDL  41  LDLCALC 39  TRIG 110  CHOLHDL 2.5   Thyroid Function Tests: No results for input(s): "TSH", "T4TOTAL", "FREET4", "T3FREE", "THYROIDAB" in the last 72 hours. Anemia Panel: No results for input(s): "VITAMINB12", "FOLATE", "FERRITIN", "TIBC", "IRON", "RETICCTPCT" in the last 72 hours. Sepsis Labs: No results for input(s): "PROCALCITON", "LATICACIDVEN" in the last 168 hours.  No results found for this or any previous visit (from the past 240 hour(s)).       Radiology Studies: MR BRAIN WO CONTRAST  Result Date: 02/19/2023 CLINICAL DATA:  Provided history: Neuro deficit, acute, stroke suspected. CVA. EXAM: MRI HEAD WITHOUT CONTRAST TECHNIQUE: Multiplanar, multiecho pulse sequences of the brain and surrounding structures were obtained without intravenous contrast. COMPARISON:  Non-contrast head CT  and CT angiogram head/neck 02/17/2023. Brain MRI 01/14/2023. Brain MRI 05/19/2022. FINDINGS: Brain: Post-operative changes from prior bifrontal craniotomy for meningioma resection. A residual 10 x 7 mm meningioma along the planum sphenoidale was better delineated on the prior contrast-enhanced brain MRI of 05/19/2022, but appears unchanged in size. Encephalomalacia/gliosis again noted within the anterior frontal lobes bilaterally. Chronic blood products also again noted within/along the anterior frontal lobes bilaterally. Background multifocal T2 FLAIR hyperintense signal abnormality within the cerebral white matter (mild) and pons (moderate), nonspecific but compatible with chronic small vessel ischemic disease. Unchanged chronic microhemorrhage within the mid right frontal lobe. There is no acute infarct No extra-axial fluid collection. No midline shift. Vascular: Maintained flow voids within the proximal large arterial vessels. Skull and upper cervical spine: Bifrontal cranioplasty. No focal worrisome marrow lesion. Incompletely assessed cervical spondylosis. Sinuses/Orbits: No mass or acute finding within the imaged orbits. Prior but ocular lens replacement. Trace mucosal thickening within the bilateral ethmoid and maxillary sinuses. IMPRESSION: 1. No evidence of an acute infarct. 2. Post-surgical changes from prior bifrontal craniotomy and meningioma resection, as described. A small residual meningioma along the planum sphenoidale was better delineated on the prior contrast-enhanced brain MRI 05/19/2022, but appears unchanged in size. 3. Background chronic small vessel ischemic changes within the cerebral white matter (mild) and within the pons (moderate). Electronically Signed   By: Jackey Loge D.O.   On: 02/19/2023 09:20   Overnight EEG with video  Result Date: 02/19/2023 Charlsie Quest, MD     02/19/2023  6:56 AM Patient Name: [name] MRN: [id number] Epilepsy Attending: Charlsie Quest Referring  Physician/Provider: Erick Blinks, MD Duration: 02/18/2023 0758 to 02/19/2023 1459 Patient history: 72yo F with h/o epilepsy now with ams getting eeg to evaluate for seizure Level of alertness: Awake, asleep AEDs during EEG study: LEV Technical aspects: This EEG study was done with scalp electrodes positioned according to the 10-20 International system of electrode placement. Electrical activity was reviewed with band pass filter of 1-70Hz , sensitivity of 7 uV/mm, display speed of 53mm/sec with a 60Hz  notched filter applied as appropriate. EEG data were recorded continuously and digitally stored.  Video monitoring was available and reviewed as appropriate. Description: The posterior dominant rhythm consists of 9-10 Hz activity of moderate voltage (25-35 uV) seen predominantly in posterior head regions, symmetric and reactive to eye opening and eye closing. Sleep was characterized by vertex waves, sleep spindles (12 to 14 Hz), maximal frontocentral region.  Hyperventilation and photic stimulation were not performed.   IMPRESSION: This study is within normal limits. No seizures or epileptiform discharges were seen throughout the recording. A normal interictal EEG does not exclude the diagnosis of epilepsy. Priyanka Annabelle Harman   CT ANGIO HEAD NECK W WO CM (  CODE STROKE)  Result Date: 02/17/2023 CLINICAL DATA:  Unresponsive EXAM: CT ANGIOGRAPHY HEAD AND NECK WITH AND WITHOUT CONTRAST TECHNIQUE: Multidetector CT imaging of the head and neck was performed using the standard protocol during bolus administration of intravenous contrast. Multiplanar CT image reconstructions and MIPs were obtained to evaluate the vascular anatomy. Carotid stenosis measurements (when applicable) are obtained utilizing NASCET criteria, using the distal internal carotid diameter as the denominator. RADIATION DOSE REDUCTION: This exam was performed according to the departmental dose-optimization program which includes automated exposure  control, adjustment of the mA and/or kV according to patient size and/or use of iterative reconstruction technique. CONTRAST:  60mL OMNIPAQUE IOHEXOL 350 MG/ML SOLN COMPARISON:  None Available. FINDINGS: CTA NECK FINDINGS SKELETON: No acute abnormality or high grade bony spinal canal stenosis. OTHER NECK: Heterogeneous multinodular thyroid gland. UPPER CHEST: No pneumothorax or pleural effusion. No nodules or masses. AORTIC ARCH: There is calcific atherosclerosis of the aortic arch. Aberrant right subclavian artery. RIGHT CAROTID SYSTEM: Normal without aneurysm, dissection or stenosis. LEFT CAROTID SYSTEM: No dissection, occlusion or aneurysm. Mild atherosclerotic calcification at the carotid bifurcation without hemodynamically significant stenosis. VERTEBRAL ARTERIES: Left dominant configuration. Normal CTA HEAD FINDINGS POSTERIOR CIRCULATION: Mild atherosclerotic calcification of the left V4 segment with moderate stenosis. No proximal occlusion of the anterior or inferior cerebellar arteries. Basilar artery is normal. Superior cerebellar arteries are normal. Posterior cerebral arteries are normal. ANTERIOR CIRCULATION: Atherosclerotic calcification of the internal carotid arteries at the skull base without hemodynamically significant stenosis. Anterior cerebral arteries are normal. Middle cerebral arteries are normal. VENOUS SINUSES: As permitted by contrast timing, patent. ANATOMIC VARIANTS: Fetal origin of the right posterior cerebral artery. Review of the MIP images confirms the above findings. IMPRESSION: 1. No emergent large vessel occlusion. 2. Moderate stenosis of the left V4 segment. Aortic Atherosclerosis (ICD10-I70.0). Electronically Signed   By: Deatra Robinson M.D.   On: 02/17/2023 19:36   CT HEAD CODE STROKE WO CONTRAST  Result Date: 02/17/2023 CLINICAL DATA:  Code stroke.  Altered mental status EXAM: CT HEAD WITHOUT CONTRAST TECHNIQUE: Contiguous axial images were obtained from the base of the  skull through the vertex without intravenous contrast. RADIATION DOSE REDUCTION: This exam was performed according to the departmental dose-optimization program which includes automated exposure control, adjustment of the mA and/or kV according to patient size and/or use of iterative reconstruction technique. COMPARISON:  CT Head 02/11/23 FINDINGS: Brain: Postsurgical encephalomalacia along the medial frontal lobes bilaterally. No hemorrhage. No hydrocephalus. No extra-axial fluid collection. No CT evidence of an acute cortical infarct. No mass effect. No mass lesion. Vascular: No hyperdense vessel or unexpected calcification. Skull: Postsurgical changes from bifrontal craniotomy. Sinuses/Orbits: No middle ear or mastoid effusion. Paranasal sinuses are grossly clear. Bilateral lens replacement. Orbits are otherwise unremarkable. Other: None. ASPECTS ([place] Stroke Program Early CT Score): 10 IMPRESSION: 1. No hemorrhage or CT evidence of an acute cortical infarct. 2. Postsurgical encephalomalacia along the medial frontal lobes bilaterally. Findings were discussed with Dr. Estell Harpin on 02/17/23 at 6:55 PM. Electronically Signed   By: Lorenza Cambridge M.D.   On: 02/17/2023 18:56        Scheduled Meds:   stroke: early stages of recovery book   Does not apply Once   cycloSPORINE  1 drop Both Eyes Daily   heparin  5,000 Units Subcutaneous Q8H   lamoTRIgine  150 mg Oral BID   levETIRAcetam  500 mg Oral BID   potassium chloride  40 mEq Oral Q4H   Continuous Infusions:  ciprofloxacin       LOS: 2 days    Time spent: 57 minutes spent on chart review, discussion with nursing staff, consultants, updating family and interview/physical exam; more than 50% of that time was spent in counseling and/or coordination of care.    Alvira Philips [place], DO [facility] Available via Epic secure chat 7am-7pm After these hours, please refer to coverage provider listed on [email or web address] 02/19/2023, 1:22 PM

## 2023-02-19 NOTE — Evaluation (Signed)
Physical Therapy Evaluation Patient Details Name: [name] MRN: [id number] DOB: 06/03/1950 Today's Date: 02/19/2023  History of Present Illness  Pt is a 72 y.o. female presenting 12/07 from [facility] after fall and subsequent AMS/agitation. EEG with no seizure or epileptiform discharges. CT head negative. PMHx: HTN, HLD, GERD, COPD, hypothyroidism, seizures, CVA, T2DM, glaucoma, PVD, PAF, PPM, CKD, bipolar disorder, bifrontal meningioma status postresection  Clinical Impression   Pt admitted secondary to problem above with deficits below. PTA patient was residing at SNF/?LTC. She was ambulatory with RW per chart.  Pt currently requires min assist for sit to stand and unable to ambulate due to multiple lines (including EEG) and decr following commands.  Anticipate patient will benefit from PT to address problems listed below. Will continue to follow acutely to maximize functional mobility independence and safety. If was not a LTC resident, recommend PT evaluation upon return to SNF.          If plan is discharge home, recommend the following: Assistance with cooking/housework;Direct supervision/assist for medications management;Direct supervision/assist for financial management;Assist for transportation;A little help with walking and/or transfers;Help with stairs or ramp for entrance;Supervision due to cognitive status   Can travel by private vehicle   Yes    Equipment Recommendations None recommended by PT  Recommendations for Other Services       Functional Status Assessment Patient has had a recent decline in their functional status and demonstrates the ability to make significant improvements in function in a reasonable and predictable amount of time.     Precautions / Restrictions Precautions Precautions: Fall Restrictions Weight Bearing Restrictions: No      Mobility  Bed Mobility Overal bed mobility: Needs Assistance Bed Mobility: Supine to Sit, Sit to Supine      Supine to sit: Contact guard Sit to supine: Contact guard assist   General bed mobility comments: CGA for lines as pt with decr awareness of lines    Transfers Overall transfer level: Needs assistance Equipment used: Rolling walker (2 wheels) Transfers: Sit to/from Stand Sit to Stand: Min assist           General transfer comment: light min A to come to stand x2 during session, pt able to take a few side steps toward [facility] with max tactile cues for each step    Ambulation/Gait                  Stairs            Wheelchair Mobility     Tilt Bed    Modified Rankin (Stroke Patients Only)       Balance Overall balance assessment: Needs assistance Sitting-balance support: No upper extremity supported Sitting balance-Leahy Scale: Fair Sitting balance - Comments: good/fair sitting balance this session, more guarding needing during weight shifts but showing poor control of trunk during weight shifts with writhing like movement patterns.   Standing balance support: During functional activity, Reliant on assistive device for balance, Bilateral upper extremity supported Standing balance-Leahy Scale: Poor Standing balance comment: reliant on UE support in standing                             Pertinent Vitals/Pain Pain Assessment Pain Assessment: Faces Faces Pain Scale: No hurt    Home Living Family/patient expects to be discharged to:: Skilled nursing facility  Additional Comments: from [facility] LTC per chart    Prior Function Prior Level of Function : Needs assist             Mobility Comments: ambulate with rollator vs RW per previous medical record       Extremity/Trunk Assessment   Upper Extremity Assessment Upper Extremity Assessment: Defer to OT evaluation    Lower Extremity Assessment Lower Extremity Assessment: Generalized weakness    Cervical / Trunk Assessment Cervical / Trunk  Assessment: Kyphotic  Communication   Communication Communication: No apparent difficulties  Cognition Arousal: Alert Behavior During Therapy: Flat affect Overall Cognitive Status: No family/caregiver present to determine baseline cognitive functioning Area of Impairment: Attention, Following commands, Awareness, Safety/judgement, Problem solving, Orientation                 Orientation Level: Disoriented to, Place, Time, Situation Current Attention Level: Focused   Following Commands: Follows one step commands inconsistently Safety/Judgement: Decreased awareness of deficits Awareness: Intellectual Problem Solving: Requires verbal cues, Slow processing General Comments: Limited insight to deficits and safety implications.        General Comments General comments (skin integrity, edema, etc.): pt with continuous EEG, telemetry and IV    Exercises     Assessment/Plan    PT Assessment Patient needs continued PT services  PT Problem List Decreased strength;Decreased activity tolerance;Decreased balance;Decreased mobility;Decreased coordination;Decreased cognition;Decreased knowledge of use of DME;Decreased safety awareness;Decreased knowledge of precautions       PT Treatment Interventions DME instruction;Gait training;Functional mobility training;Therapeutic activities;Therapeutic exercise;Balance training;Neuromuscular re-education;Cognitive remediation    PT Goals (Current goals can be found in the Care Plan section)  Acute Rehab PT Goals Patient Stated Goal: unable to state PT Goal Formulation: With patient Time For Goal Achievement: 03/05/23 Potential to Achieve Goals: Good    Frequency Min 1X/week     Co-evaluation PT/OT/SLP Co-Evaluation/Treatment: Yes Reason for Co-Treatment: To address functional/ADL transfers PT goals addressed during session: Mobility/safety with mobility;Balance;Proper use of DME OT goals addressed during session: ADL's and self-care        AM-PAC PT "6 Clicks" Mobility  Outcome Measure Help needed turning from your back to your side while in a flat bed without using bedrails?: A Little Help needed moving from lying on your back to sitting on the side of a flat bed without using bedrails?: A Little Help needed moving to and from a bed to a chair (including a wheelchair)?: A Little Help needed standing up from a chair using your arms (e.g., wheelchair or bedside chair)?: A Little Help needed to walk in hospital room?: Total Help needed climbing 3-5 steps with a railing? : Total 6 Click Score: 14    End of Session   Activity Tolerance: Patient tolerated treatment well Patient left: in bed;with call bell/phone within reach;with bed alarm set;Other (comment) (with SLP) Nurse Communication: Mobility status;Other (comment) (IV came out) PT Visit Diagnosis: Unsteadiness on feet (R26.81);Muscle weakness (generalized) (M62.81)    Time: 0865-7846 PT Time Calculation (min) (ACUTE ONLY): 15 min   Charges:   PT Evaluation $PT Eval Low Complexity: 1 Low   PT General Charges $$ ACUTE PT VISIT: 1 Visit          Jerolyn Center, PT Acute Rehabilitation Services  Office [phone]   Zena Amos 02/19/2023, 10:08 AM

## 2023-02-19 NOTE — Progress Notes (Signed)
LTM maint complete - no skin breakdown under: FP1, FP2, O1, A2. Headwrap applied due to pt continually pulling at leads.

## 2023-02-19 NOTE — Progress Notes (Signed)
This encounter was created in error - please disregard.

## 2023-02-19 NOTE — Progress Notes (Signed)
LTM EEG disconnected - no skin breakdown at unhook.  

## 2023-02-19 NOTE — Progress Notes (Signed)
NEUROLOGY CONSULT FOLLOW UP NOTE   Date of service: February 19, 2023 Patient Name: [name] MRN:  [id number] DOB:  October 01, 1950  Brief HPI  [name] is a 72 y.o. female history of bipolar disorder, bifrontal meningioma status post resection, COPD, hypertension, hyperlipidemia, hypothyroidism and seizure disorder who was originally brought in from her facility with agitation.  Baseline, she is alert and oriented x 2 and ambulates with a walker or cane.  She does have frequent falls.  CT head demonstrated no acute abnormality, and CT angiogram did not show large vessel occlusion.  She was given Ativan for possible seizure activity and became more somnolent but did not have improvement in exam.  Was connected to continuous EEG for concern for nonconvulsive seizures, which have not been seen so far.  She has had a recent admission for altered mental status, which was attributed to polypharmacy.  At baseline, she can be combative, and is confused, but does typically know where she is.  Interval Hx/subjective  Patient's exam has continued to improve.  Vitals   Vitals:   02/18/23 2346 02/19/23 0333 02/19/23 0920 02/19/23 1130  BP: (!) 154/57 (!) 170/80 (!) 161/70 (!) 126/57  Pulse: 94  93 97  Resp: 19 18 17 17   Temp: 98.1 F (36.7 C) 98.3 F (36.8 C) 99 F (37.2 C) 98.1 F (36.7 C)  TempSrc: Oral Oral Oral Axillary  SpO2: 100% 100% 100% 100%  Weight:      Height:         Body mass index is 31.17 kg/m.  Physical Exam   Constitutional: Chronically ill-appearing elderly patient in no acute distress Psych: Affect blunted Eyes: No scleral injection.  HENT: No OP obstrucion.  Head: Normocephalic.  Cardiovascular: Normal rate and regular rhythm.  Respiratory: Effort normal, non-labored breathing.  Skin: WDI.   Neurologic Examination    NEURO:  Mental Status: Patient opens eyes to mild stimulation.  She tells me the month is November, initially states "22" when asked her the  year, but corrects her self to "24" without prompting.  She does not give an answer when I ask her where she is, but then answers "I guess" when I ask if we are in an apartment building, and the same answer when asked if we are in a hospital.  speech/Language: speech is moderately dysarthric but understandable  Cranial Nerves:  II: PERRL.  III, IV, VI: Able to track examiner from side-to-side VII: Face is symmetrical at rest and when speaking VIII: hearing intact to voice. IX, X: Voice is somewhat dysarthric XII: Not cooperative with tongue protrusion Motor: Able to move all 4 extremities with good antigravity strength, unable to perform confrontational strength testing due to inability to consistently follow commands Tone: is normal and bulk is normal Sensation- Intact to light touch bilaterally.  Coordination: Unable to perform Gait- deferred   Medications  Current Facility-Administered Medications:     stroke: early stages of recovery book, , Does not apply, Once, Elgergawy, Leana Roe, MD   acetaminophen (TYLENOL) tablet 650 mg, 650 mg, Oral, Q4H PRN **OR** acetaminophen (TYLENOL) 160 MG/5ML solution 650 mg, 650 mg, Per Tube, Q4H PRN **OR** acetaminophen (TYLENOL) suppository 650 mg, 650 mg, Rectal, Q4H PRN, Elgergawy, Leana Roe, MD   albuterol (PROVENTIL) (2.5 MG/3ML) 0.083% nebulizer solution 2.5 mg, 2.5 mg, Nebulization, Q4H PRN, [place], Eric J, DO   ciprofloxacin (CIPRO) tablet 500 mg, 500 mg, Oral, BID, [place], Eric J, DO   cycloSPORINE (RESTASIS) 0.05 %  ophthalmic emulsion 1 drop, 1 drop, Both Eyes, Daily, Elgergawy, Leana Roe, MD, 1 drop at 02/18/23 1016   heparin injection 5,000 Units, 5,000 Units, Subcutaneous, Q8H, Elgergawy, Leana Roe, MD, 5,000 Units at 02/19/23 4098   hydrALAZINE (APRESOLINE) injection 5 mg, 5 mg, Intravenous, Q6H PRN, [place], Alvira Philips, DO   levETIRAcetam (KEPPRA) tablet 500 mg, 500 mg, Oral, BID, [place], Eric J, DO   magnesium oxide (MAG-OX) tablet 400 mg,  400 mg, Oral, BID, [place], Eric J, DO   potassium chloride (KLOR-CON) packet 40 mEq, 40 mEq, Oral, Q4H, [place], Eric J, DO, 40 mEq at 02/19/23 1026   senna-docusate (Senokot-S) tablet 1 tablet, 1 tablet, Oral, QHS PRN, Starleen Arms, MD [facility]   CBC:  Recent Labs  Lab 02/17/23 1837 02/18/23 1553 02/19/23 0627  WBC 7.6 9.3 10.7*  NEUTROABS 4.6  --   --   HGB 10.3* 10.0* 9.3*  HCT 37.2 35.2* 32.2*  MCV 78.0* 76.9* 77.0*  PLT 251 226 220    Basic Metabolic Panel:  Lab Results  Component Value Date   NA 142 02/19/2023   K 3.1 (L) 02/19/2023   CO2 21 (L) 02/19/2023   GLUCOSE 111 (H) 02/19/2023   BUN 9 02/19/2023   CREATININE 0.84 02/19/2023   CALCIUM 8.5 (L) 02/19/2023   GFRNONAA >60 02/19/2023   GFRAA 72 12/11/2019   Lipid Panel:  Lab Results  Component Value Date   LDLCALC 39 02/18/2023   HgbA1c:  Lab Results  Component Value Date   HGBA1C 5.2 02/18/2023   Urine Drug Screen:     Component Value Date/Time   LABOPIA NONE DETECTED 01/15/2023 1215   COCAINSCRNUR NONE DETECTED 01/15/2023 1215   LABBENZ NONE DETECTED 01/15/2023 1215   AMPHETMU NONE DETECTED 01/15/2023 1215   THCU NONE DETECTED 01/15/2023 1215   LABBARB NONE DETECTED 01/15/2023 1215    Alcohol Level     Component Value Date/Time   ETH <10 02/17/2023 1837   INR  Lab Results  Component Value Date   INR 1.1 02/17/2023   APTT  Lab Results  Component Value Date   APTT 26 02/17/2023   AED levels:  Lab Results  Component Value Date   LAMOTRIGINE 17.4 11/18/2020   B12 and TSH from recent hosptialization were negative.   Assessment   [name] is a 72 y.o. female with history of CKD, bipolar disorder, bifrontal meningioma status post resection, COPD, hypertension, hyperlipidemia, hypothyroidism and seizures who was brought in by EMS from her facility for agitation.  She has had frequent falls, more recently than usual and was noted to be somnolent and unable to  follow commands on arrival.  It is possible that she had an unwitnessed seizure, but this is unclear and I am hesitant to make changes to her medications given previous concerns for polypharmacy as well.  She has since been gradually improving.  Discussing with her nursing home, she does have some confusion at baseline and does become combative at times.  She is continuing to improve, and therefore I do not know that I would expect further evaluation to be likely to yield further information at all likely to change management.   Recommendations  -Continue Keppra 500 mg twice daily -Restart home lamotrigine 150 twice daily - if she continues to have episodes of unresponsiveness and altered mental status as presenting signs, could consider increasing her Keppra. -Neurology will be available as needed, please call with further questions or concerns. ______________________________________________________________________  Ritta Slot, MD [facility] [phone]  If 7pm- 7am, please [facility] on call as listed in AMION.

## 2023-02-20 DIAGNOSIS — N3000 Acute cystitis without hematuria: Secondary | ICD-10-CM | POA: Diagnosis not present

## 2023-02-20 DIAGNOSIS — R627 Adult failure to thrive: Secondary | ICD-10-CM

## 2023-02-20 DIAGNOSIS — G9341 Metabolic encephalopathy: Secondary | ICD-10-CM | POA: Diagnosis not present

## 2023-02-20 DIAGNOSIS — R569 Unspecified convulsions: Secondary | ICD-10-CM | POA: Diagnosis not present

## 2023-02-20 LAB — BASIC METABOLIC PANEL
Anion gap: 9 (ref 5–15)
BUN: 12 mg/dL (ref 8–23)
CO2: 23 mmol/L (ref 22–32)
Calcium: 8.7 mg/dL — ABNORMAL LOW (ref 8.9–10.3)
Chloride: 107 mmol/L (ref 98–111)
Creatinine, Ser: 0.79 mg/dL (ref 0.44–1.00)
GFR, Estimated: >60 mL/min (ref >60)
Glucose, Bld: 129 mg/dL — ABNORMAL HIGH (ref 70–99)
Potassium: 3.4 mmol/L — ABNORMAL LOW (ref 3.5–5.1)
Sodium: 139 mmol/L (ref 135–145)

## 2023-02-20 LAB — CBC
HCT: 30.8 % — ABNORMAL LOW (ref 36.0–46.0)
Hemoglobin: 9 g/dL — ABNORMAL LOW (ref 12.0–15.0)
MCH: 22 pg — ABNORMAL LOW (ref 26.0–34.0)
MCHC: 29.2 g/dL — ABNORMAL LOW (ref 30.0–36.0)
MCV: 75.3 fL — ABNORMAL LOW (ref 80.0–100.0)
Platelets: 240 10*3/uL (ref 150–400)
RBC: 4.09 MIL/uL (ref 3.87–5.11)
RDW: 15.8 % — ABNORMAL HIGH (ref 11.5–15.5)
WBC: 10 10*3/uL (ref 4.0–10.5)
nRBC: 0 % (ref 0.0–0.2)

## 2023-02-20 LAB — GLUCOSE, CAPILLARY: Glucose-Capillary: 173 mg/dL — ABNORMAL HIGH (ref 70–99)

## 2023-02-20 LAB — MAGNESIUM: Magnesium: 2.3 mg/dL (ref 1.7–2.4)

## 2023-02-20 MED ORDER — LEVOTHYROXINE SODIUM 25 MCG PO TABS
25.0000 ug | ORAL_TABLET | ORAL | Status: DC
  Filled 2023-02-20: qty 1

## 2023-02-20 MED ORDER — LEVOTHYROXINE SODIUM 25 MCG PO TABS: 25.0000 ug | ORAL_TABLET | ORAL | Status: DC

## 2023-02-20 MED ORDER — LEVOTHYROXINE SODIUM 50 MCG PO TABS
50.0000 ug | ORAL_TABLET | ORAL | Status: DC
  Administered 2023-02-20 – 2023-02-23 (×3): 50 ug via ORAL
  Filled 2023-02-20 (×6): qty 1

## 2023-02-20 MED ORDER — LORAZEPAM 2 MG/ML IJ SOLN
1.0000 mg | INTRAMUSCULAR | Status: AC
  Administered 2023-02-20: 1 mg via INTRAMUSCULAR
  Filled 2023-02-20: qty 1

## 2023-02-20 MED ORDER — DIPHENHYDRAMINE HCL 50 MG/ML IJ SOLN
25.0000 mg | INTRAMUSCULAR | Status: AC
  Administered 2023-02-20: 25 mg via INTRAVENOUS
  Filled 2023-02-20: qty 1

## 2023-02-20 MED ORDER — POTASSIUM CHLORIDE 20 MEQ PO PACK
40.0000 meq | PACK | ORAL | Status: AC
  Filled 2023-02-20 (×2): qty 2

## 2023-02-20 NOTE — Care Management Important Message (Signed)
Important Message  Patient Details  Name: [name] MRN: [id number] Date of Birth: 04/07/1950   Important Message Given:  Yes - Medicare IM     Dorena Bodo 02/20/2023, 2:35 PM

## 2023-02-20 NOTE — Progress Notes (Signed)
PROGRESS NOTE    Jules Wensel Krakow  ZOX:[id number] DOB: 1950/06/08 DOA: 02/17/2023 PCP: Kerri Perches, MD    Brief Narrative:   [name] is a 72 y.o. female with past medical history significant for HTN, HLD, DM2, history of CVA, COPD, hypothyroidism, seizure disorder, history of meningioma s/p resection, bipolar disorder, fibromyalgia, osteoarthritis, diverticulosis, GERD, chronic anemia, OSA on CPAP with recent hospitalization due to encephalopathy, acute renal failure and seizure subsequent discharge to SNF who presented to [facility] ED on 12/7 via EMS for altered mental status.  Patient was currently obtunded, encephalopathic and history obtained from records, consultants and staff.  On arrival code stroke was initiated.  Apparently last known well approximately 4 PM this afternoon.  At baseline usually oriented x 2, ambulatory with a walker/cane.  Patient was transported the ED for further evaluation and management per EMS.  In the ED, temperature 98.4 F, HR 80, RR 22, BP 146/68, SpO2 94% on room air.  WBC 7.6, hemoglobin 10.3, platelet count 251.  Sodium 142, potassium 3.4, chloride 106, CO2 27, BUN 16, creat 1.30, glucose 108.  AST 19, ALT 14, total bilirubin 0.5.  INR 1.1.  EtOH level less than 10.  CT head without contrast with no hemorrhage or CT evidence of acute cortical infarct, postsurgical encephalomalacia along the medial frontal lobes bilaterally.  CT angiogram head/neck with no emergent large vessel occlusion, moderate stenosis left V4 segment.  Neurology was consulted.  She was deemed high risk for TNK and received Ativan for concern of seizures.  She was loaded with Keppra.  Received aspirin per rectum.  TRH consulted for admission and patient was transferred ED to ED to [facility] for LTM EEG and neurology evaluation.  Assessment & Plan:   Acute metabolic encephalopathy Nonconvulsive seizure Concern for acute CVA: Ruled out Patient presenting from SNF  with change in mental status, agitation.  Last known normal 4 PM day of ED arrival.  Initially code stroke was initiated with CT head and CT angiogram head/neck unrevealing.  Also concern of breakthrough seizures and patient was given Ativan and loaded with Keppra.  EEG with no seizures or epileptiform discharges noted.  Neurology was consulted and followed during hospital course.  EEG discontinued 12/9.  MRI brain without contrast negative for acute infarct. -- Keppra 500 mg PO q12h -- Lamictal 150 mg p.o. twice daily -- Seizure precautions  UTI Urinalysis with moderate leukocytes, negative nitrite, rare bacteria, 11-20 WBCs.  WBC count elevated 10.7. -- Urine culture: Not collected -- Ciprofloxacin 400 mg IV every 12 hours (allergy to penicillin/cephalosporins)  Hypokalemia Potassium 3.4; will replete -- Repeat electrolytes in a.m.  Essential hypertension -- amlodipine 5 mg p.o. daily, carvedilol 12.5 m p.o. twice daily  Hyperlipidemia -- Hold home Zetia and Crestor for now  DM2 Home regimen includes Semglee 10 units Green daily.  Hemoglobin C 5.8 01/13/2023. -- Hold insulin -- Continue monitor glucose intermittently  COPD Stable, on room air -- Albuterol neb every 4 hours as needed shortness of breath/wheezing  Hypothyroidism On levothyroxine 50 mcg p.o. daily except for Sundays, 25 mg.  Chronic anemia Hemoglobin 10.3, stable.  Anxiety/depression -- Sertraline 100 mg p.o. daily -- BuSpar 5 mg p.o. 3 times daily -- Hold home Ativan for now  Weakness/debility/deconditioning/gait disturbance Adult failure to thrive Per niece, patient has been slowly declining since rotator cuff surgery earlier this year.  Was initially at acute rehab and recently transition to long-term care given lack of progress.  Despite aggressive attempts, patient continues to further decline.  Discussed with niece at bedside that given her continued decline/demise, advanced age, chronic comorbidities it  would be recommended to transition to a more comfort approach with hospice.  She would like to discuss further with other family members. -- Palliative care consulted for assistance with goals of care and medical decision making.  DVT prophylaxis: heparin injection 5,000 Units Start: 02/17/23 2200    Code Status: Full Code Family Communication: Updated niece present at bedside this morning  Disposition Plan:  Level of care: Telemetry Medical Status is: Inpatient Remains inpatient appropriate because: Pending return to SNF, niece not interested in return to [facility], [facility] consulted, also consulted pelvic care for assistance with goals of care and Medical Decision Making as her overall prognosis poor/guarded with adult failure to thrive    Consultants:  Neurology Palliative care  Procedures:  EEG  Antimicrobials:  None   Subjective: Patient seen examined bedside, lying in bed.  Continues to be confused.  Niece present at bedside.  Refusing medications this morning.  Denies chest pain, no shortness of breath, no abdominal pain.  Discussed with niece extensively regarding her ultimate gradual decline, confusion.  Discussed at this point given her advanced age, comorbidities that would recommend transitioning to hospice at this time.  She would like to discuss further with other family members.  Also niece is not interested in patient returning to [facility], so [facility] consulted.  Unable to gain any further ROS from patient due to her confusion.  No acute events overnight per nursing staff.  Objective: Vitals:   02/19/23 2022 02/19/23 2322 02/20/23 0811 02/20/23 1141  BP: (!) 156/52 (!) 158/84 (!) 157/62 (!) 149/67  Pulse:  97 84 75  Resp: 20 18 20 17   Temp: 98.4 F (36.9 C) 98.4 F (36.9 C) 97.7 F (36.5 C) 97.8 F (36.6 C)  TempSrc: Oral Oral Oral Oral  SpO2: 93% 99% 96% 100%  Weight:      Height:        Intake/Output Summary (Last 24 hours) at 02/20/2023  1328 Last data filed at 02/20/2023 0400 Gross per 24 hour  Intake 769.56 ml  Output --  Net 769.56 ml   Filed Weights   02/17/23 1837  Weight: 70 kg    Examination:  Physical Exam: GEN: NAD, alert, confused, restless, chronically ill in appearance, appears older than stated age HEENT: NCAT, PERRL, EOMI, sclera clear, dry mucous membranes PULM: CTAB w/o wheezes/crackles, normal respiratory effort, on room air CV: RRR w/o M/G/R GI: abd soft, NTND, + BS MSK: no peripheral edema, moves all extremities independently NEURO: CN II-XII intact, no focal neurological deficit appreciated Integumentary: no concerning rashes/lesions/wounds noted on exposed skin surfaces    Data Reviewed: I have personally reviewed following labs and imaging studies  CBC: Recent Labs  Lab 02/17/23 1837 02/18/23 1553 02/19/23 0627 02/20/23 0456  WBC 7.6 9.3 10.7* 10.0  NEUTROABS 4.6  --   --   --   HGB 10.3* 10.0* 9.3* 9.0*  HCT 37.2 35.2* 32.2* 30.8*  MCV 78.0* 76.9* 77.0* 75.3*  PLT 251 226 220 240   Basic Metabolic Panel: Recent Labs  Lab 02/17/23 1837 02/18/23 1553 02/19/23 0627 02/20/23 0456  NA 142 144 142 139  K 3.4* 3.2* 3.1* 3.4*  CL 106 108 107 107  CO2 27 23 21* 23  GLUCOSE 108* 127* 111* 129*  BUN 16 9 9 12   CREATININE 1.30* 0.87 0.84 0.79  CALCIUM 9.5 8.9 8.5* 8.7*  MG  --   --  1.8 2.3   GFR: Estimated Creatinine Clearance: 54.1 mL/min (by C-G formula based on SCr of 0.79 mg/dL). Liver Function Tests: Recent Labs  Lab 02/17/23 1837  AST 19  ALT 14  ALKPHOS 70  BILITOT 0.5  PROT 7.5  ALBUMIN 4.2   No results for input(s): "LIPASE", "AMYLASE" in the last 168 hours. No results for input(s): "AMMONIA" in the last 168 hours. Coagulation Profile: Recent Labs  Lab 02/17/23 1837  INR 1.1   Cardiac Enzymes: No results for input(s): "CKTOTAL", "CKMB", "CKMBINDEX", "TROPONINI" in the last 168 hours. BNP (last 3 results) No results for input(s): "PROBNP" in the  last 8760 hours. HbA1C: Recent Labs    02/18/23 1553  HGBA1C 5.2   CBG: Recent Labs  Lab 02/17/23 1833 02/18/23 0049 02/18/23 0507 02/18/23 0742 02/20/23 0718  GLUCAP 89 93 107* 113* 173*   Lipid Profile: Recent Labs    02/18/23 1553  CHOL 102  HDL 41  LDLCALC 39  TRIG 110  CHOLHDL 2.5   Thyroid Function Tests: No results for input(s): "TSH", "T4TOTAL", "FREET4", "T3FREE", "THYROIDAB" in the last 72 hours. Anemia Panel: No results for input(s): "VITAMINB12", "FOLATE", "FERRITIN", "TIBC", "IRON", "RETICCTPCT" in the last 72 hours. Sepsis Labs: No results for input(s): "PROCALCITON", "LATICACIDVEN" in the last 168 hours.  No results found for this or any previous visit (from the past 240 hour(s)).       Radiology Studies: MR BRAIN WO CONTRAST  Result Date: 02/19/2023 CLINICAL DATA:  Provided history: Neuro deficit, acute, stroke suspected. CVA. EXAM: MRI HEAD WITHOUT CONTRAST TECHNIQUE: Multiplanar, multiecho pulse sequences of the brain and surrounding structures were obtained without intravenous contrast. COMPARISON:  Non-contrast head CT and CT angiogram head/neck 02/17/2023. Brain MRI 01/14/2023. Brain MRI 05/19/2022. FINDINGS: Brain: Post-operative changes from prior bifrontal craniotomy for meningioma resection. A residual 10 x 7 mm meningioma along the planum sphenoidale was better delineated on the prior contrast-enhanced brain MRI of 05/19/2022, but appears unchanged in size. Encephalomalacia/gliosis again noted within the anterior frontal lobes bilaterally. Chronic blood products also again noted within/along the anterior frontal lobes bilaterally. Background multifocal T2 FLAIR hyperintense signal abnormality within the cerebral white matter (mild) and pons (moderate), nonspecific but compatible with chronic small vessel ischemic disease. Unchanged chronic microhemorrhage within the mid right frontal lobe. There is no acute infarct No extra-axial fluid collection.  No midline shift. Vascular: Maintained flow voids within the proximal large arterial vessels. Skull and upper cervical spine: Bifrontal cranioplasty. No focal worrisome marrow lesion. Incompletely assessed cervical spondylosis. Sinuses/Orbits: No mass or acute finding within the imaged orbits. Prior but ocular lens replacement. Trace mucosal thickening within the bilateral ethmoid and maxillary sinuses. IMPRESSION: 1. No evidence of an acute infarct. 2. Post-surgical changes from prior bifrontal craniotomy and meningioma resection, as described. A small residual meningioma along the planum sphenoidale was better delineated on the prior contrast-enhanced brain MRI 05/19/2022, but appears unchanged in size. 3. Background chronic small vessel ischemic changes within the cerebral white matter (mild) and within the pons (moderate). Electronically Signed   By: Jackey Loge D.O.   On: 02/19/2023 09:20   Overnight EEG with video  Result Date: 02/19/2023 Charlsie Quest, MD     02/20/2023  9:39 AM Patient Name: [name] MRN: [id number] Epilepsy Attending: Charlsie Quest Referring Physician/Provider: Erick Blinks, MD Duration: 02/18/2023 0758 to 02/19/2023 1312 Patient history: 72yo F with h/o epilepsy now with  ams getting eeg to evaluate for seizure Level of alertness: Awake, asleep AEDs during EEG study: LEV Technical aspects: This EEG study was done with scalp electrodes positioned according to the 10-20 International system of electrode placement. Electrical activity was reviewed with band pass filter of 1-70Hz , sensitivity of 7 uV/mm, display speed of 56mm/sec with a 60Hz  notched filter applied as appropriate. EEG data were recorded continuously and digitally stored.  Video monitoring was available and reviewed as appropriate. Description: The posterior dominant rhythm consists of 9-10 Hz activity of moderate voltage (25-35 uV) seen predominantly in posterior head regions, symmetric and reactive to eye  opening and eye closing. Sleep was characterized by vertex waves, sleep spindles (12 to 14 Hz), maximal frontocentral region.  Hyperventilation and photic stimulation were not performed.  EEG was disconnected between 02/18/2023 1459 to 02/19/2023 1036 IMPRESSION: This study is within normal limits. No seizures or epileptiform discharges were seen throughout the recording. A normal interictal EEG does not exclude the diagnosis of epilepsy. Priyanka Annabelle Harman        Scheduled Meds:   stroke: early stages of recovery book   Does not apply Once   amLODipine  5 mg Oral Daily   busPIRone  5 mg Oral TID   carvedilol  12.5 mg Oral BID WC   cycloSPORINE  1 drop Both Eyes Daily   heparin  5,000 Units Subcutaneous Q8H   lamoTRIgine  150 mg Oral BID   levETIRAcetam  500 mg Oral BID   potassium chloride  40 mEq Oral Q3H   sertraline  100 mg Oral q morning   Continuous Infusions:  ciprofloxacin 400 mg (02/20/23 1131)     LOS: 3 days    Time spent: 57 minutes spent on chart review, discussion with nursing staff, consultants, updating family and interview/physical exam; more than 50% of that time was spent in counseling and/or coordination of care.    Alvira Philips [place], DO [facility] Available via Epic secure chat 7am-7pm After these hours, please refer to coverage provider listed on [email or web address] 02/20/2023, 1:28 PM

## 2023-02-20 NOTE — TOC Initial Note (Signed)
Transition of Care [facility]) - Initial/Assessment Note    Patient Details  Name: [name] MRN: [id number] Date of Birth: 04-25-1950  Transition of Care [facility]) CM/SW Contact:    Baldemar Lenis, LCSW Phone Number: 02/20/2023, 3:44 PM  Clinical Narrative:        CSW met with patient's niece, Merdis Delay, at bedside to discuss disposition. Per Merdis Delay, patient has been at [facility] for rehab but her insurance recently stopped covering the rehab and they were discussing sending her home. Family is unable to care for her at home, but do not want her to return to [facility] as the family were not happy with the care and said they wouldn't keep her long term. Merdis Delay also asked questions about palliative care and if it was time to discuss hospice as the patient is refusing to take her medications and not wanting to eat. Per Merdis Delay, patient has been having a steady decline over the past couple of years and she does not see the benefit in continuing to aggressively treat things. MD has ordered palliative consult for goals of care. Pending goals of care discussion, patient may need long term care SNF placement. Patient has Medicaid in place, and family would be agreeable to placement in [place], [place], or [place]. CSW to follow.           Expected Discharge Plan: Skilled Nursing Facility Barriers to Discharge: Continued Medical Work up   Patient Goals and CMS Choice Patient states their goals for this hospitalization and ongoing recovery are:: patient unable to participate in goal setting, not fully oriented CMS Medicare.gov Compare Post Acute Care list provided to:: Patient Represenative (must comment) Choice offered to / list presented to : [facility] POA / Guardian  ownership interest in [facility].provided to:: [facility] POA / Guardian    Expected Discharge Plan and Services     Post Acute Care Choice: Skilled Nursing Facility Living arrangements for the past 2 months:  Single Family Home                                      Prior Living Arrangements/Services Living arrangements for the past 2 months: Single Family Home Lives with:: Self Patient language and need for interpreter reviewed:: No Do you feel safe going back to the place where you live?: Yes      Need for Family Participation in Patient Care: Yes (Comment) Care giver support system in place?: Yes (comment)   Criminal Activity/Legal Involvement Pertinent to Current Situation/Hospitalization: No - Comment as needed  Activities of Daily Living   ADL Screening (condition at time of admission) Independently performs ADLs?: No Does the patient have a NEW difficulty with bathing/dressing/toileting/self-feeding that is expected to last >3 days?: Yes (Initiates electronic notice to provider for possible OT consult) Does the patient have a NEW difficulty with getting in/out of bed, walking, or climbing stairs that is expected to last >3 days?: Yes (Initiates electronic notice to provider for possible PT consult) Does the patient have a NEW difficulty with communication that is expected to last >3 days?: Yes (Initiates electronic notice to provider for possible SLP consult) Is the patient deaf or have difficulty hearing?: No Does the patient have difficulty seeing, even when wearing glasses/contacts?: No Does the patient have difficulty concentrating, remembering, or making decisions?: Yes  Permission Sought/Granted Permission sought to share information with : Magazine features editor, Family  Supports Permission granted to share information with : Yes, Verbal Permission Granted  Share Information with NAME: Mcneil Sober  Permission granted to share info w AGENCY: SNF  Permission granted to share info w Relationship: Niece, Son     Emotional Assessment Appearance:: Appears stated age Attitude/Demeanor/Rapport: Unable to Assess Affect (typically observed): Unable to  Assess Orientation: : Oriented to Self Alcohol / Substance Use: Not Applicable Psych Involvement: No (comment)  Admission diagnosis:  Altered mental status, unspecified altered mental status type [R41.82] AMS (altered mental status) [R41.82] Patient Active Problem List   Diagnosis Date Noted   AMS (altered mental status) 02/17/2023   Acute metabolic encephalopathy 01/12/2023   History of CVA (cerebrovascular accident) 01/12/2023   Type 2 diabetes mellitus with diabetic chronic kidney disease (HCC) 12/14/2022   Lumbar radiculopathy 12/14/2022   Ataxia 10/25/2022   Lesion of skin of scalp 10/25/2022   Seborrheic dermatitis of scalp 10/15/2022   Dermatophytosis of nail 10/09/2022   Callus of foot 10/09/2022   Neuroma of foot 10/09/2022   History of shoulder surgery 10/04/2022   H/O total shoulder replacement, left 08/18/2022   Scalp cyst 08/10/2022   Right-sided chest pain 08/06/2022   CKD (chronic kidney disease) stage 3, GFR 30-59 ml/min (HCC) 07/03/2022   Skin tag 07/03/2022   Itchy scalp 07/03/2022   Left-sided chest pain 04/27/2022   Bilateral leg edema 12/21/2021   Hypoglycemia 12/20/2021   Skin lesions, generalized 12/19/2021   Insomnia due to stress 12/19/2021   Major depressive disorder 12/11/2021   Rotator cuff tear arthropathy 11/15/2021   Anemia in chronic kidney disease (CKD) 10/12/2021   Dehydration 09/25/2021   CHF (congestive heart failure) (HCC) 09/16/2021   Urinary frequency 09/01/2021   Anemia of chronic disease 08/10/2021   Generalized pain 08/04/2021   Sciatica, right side 07/19/2021   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/10/2021   Atherosclerosis of aorta (HCC) 07/10/2021   Generalized weakness 05/12/2021   Trigger finger, right 12/12/2020   Confusion 11/24/2020   Blurry vision 11/18/2020   Cervical radiculopathy 07/09/2020   Headache 02/19/2020   Tubular adenoma of colon 02/09/2019    Benign neoplasm of cerebral meninges (HCC) 11/12/2018   Benign meningioma of brain (HCC) 10/31/2018   Osteoarthritis of both hips 07/06/2018   Left arm pain 06/11/2018   Lipoma of extremity 12/31/2017   Impingement syndrome of left shoulder region 12/27/2017   Leg weakness, bilateral 10/28/2017   Lumbar spondylosis with myelopathy 10/12/2017   Numbness of hand 10/10/2017   Chronic neck pain 07/25/2017   Chronic pain syndrome 07/25/2017   Diabetic polyneuropathy associated with type 2 diabetes mellitus (HCC) 05/19/2016   Tobacco use disorder 04/24/2016   Obesity (BMI 30.0-34.9) 04/24/2016   History of palpitations 08/09/2015   Nausea 08/09/2015   Labile hypertension 08/03/2015   Normal coronary arteries 08/03/2015   Dizziness 07/15/2015   Left-sided low back pain with left-sided sciatica 06/27/2015   Type 2 diabetes mellitus with hyperglycemia, with long-term current use of insulin (HCC) 05/06/2015   Multinodular goiter 05/06/2015   Rectocele, female 04/27/2015   Anal sphincter incontinence 04/27/2015   Pelvic relaxation due to rectocele 03/30/2015   Hyperkalemia 02/22/2015   Pulmonary hypertension (HCC) 02/22/2015   Posterior chest pain 02/21/2015   Episodic cigarette smoking dependence 01/11/2015   Migraine without aura and without status migrainosus, not intractable 07/02/2014   Microcytic anemia 02/18/2014   Hip pain, right 12/17/2013   COPD mixed type (HCC) 09/16/2013  Chronic obstructive pulmonary disease (HCC) 09/16/2013   Acquired hypothyroidism 08/16/2013   Gastroparesis 04/28/2013   Nicotine dependence 03/09/2013   Seizure disorder (HCC) 01/19/2013   Displacement of cervical intervertebral disc without myelopathy 12/13/2012   Bursitis of shoulder 12/13/2012   Cervical disc disorder with radiculopathy of cervical region 10/31/2012   Solitary pulmonary nodule 08/19/2012   Hypersomnia disorder related to a known organic factor 06/11/2012   Pruritus 04/18/2012    Meningioma (HCC) 11/19/2011   Mononeuritis leg 10/25/2011   Carpal tunnel syndrome of right wrist 05/23/2011   Chronic pain of right hand 05/04/2011   Polypharmacy 04/28/2011   Mood disorder (HCC) 04/28/2011   Constipation 04/13/2011   Recurrent falls 12/12/2010   Oropharyngeal dysphagia 07/12/2010   Urinary incontinence 12/16/2009   HEARING LOSS 10/26/2009   DMII (diabetes mellitus, type 2) (HCC) 07/07/2009   Mixed hyperlipidemia 12/11/2008   IBS 12/11/2008   GERD 07/29/2008   MILK PRODUCTS ALLERGY 07/29/2008   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 [place] [address] Phone: [phone] Fax: [phone]  [facility] - [place], [place] - [address] [place] [address] Phone: [phone] Fax: [phone]  [facility] [address], [place] - [address] [place] [address] [place] [address] [place] [address] Phone: [phone] Fax: [phone]     Social Determinants of Health (SDOH) Social History: SDOH Screenings   Food Insecurity: No Food Insecurity (02/18/2023)  Housing: Patient Declined (02/18/2023)  Transportation Needs: No Transportation Needs (02/18/2023)  Utilities: Not At Risk (02/18/2023)  Alcohol Screen: Low Risk  (07/17/2022)  Depression (PHQ2-9): Low Risk  (12/12/2022)  Financial Resource Strain: Low Risk  (07/17/2022)  Physical Activity: Insufficiently Active (07/17/2022)  Social Connections: Moderately Integrated (07/17/2022)  Stress: No Stress Concern Present (07/17/2022)  Tobacco Use: High Risk (02/18/2023)  Health Literacy: Adequate Health Literacy (12/14/2022)   SDOH  Interventions:     Readmission Risk Interventions    08/21/2022   11:54 AM 09/16/2021   10:07 AM  Readmission Risk Prevention Plan  Transportation Screening Complete Complete  Medication Review Oceanographer) Complete Complete  PCP or Specialist appointment within 3-5 days of discharge Complete   HRI or Home Care Consult Complete Complete  SW Recovery Care/Counseling Consult Complete Complete  Palliative Care Screening Not Applicable Not Applicable  Skilled Nursing Facility Not Applicable Patient Refused

## 2023-02-20 NOTE — Progress Notes (Signed)
Patient attempting to climb out of bed, complaining of headache saying " oh my head" and clutching forehead. PRN tylenol administered

## 2023-02-21 DIAGNOSIS — Z711 Person with feared health complaint in whom no diagnosis is made: Secondary | ICD-10-CM

## 2023-02-21 DIAGNOSIS — R638 Other symptoms and signs concerning food and fluid intake: Secondary | ICD-10-CM

## 2023-02-21 DIAGNOSIS — R4182 Altered mental status, unspecified: Secondary | ICD-10-CM | POA: Diagnosis not present

## 2023-02-21 DIAGNOSIS — N3000 Acute cystitis without hematuria: Secondary | ICD-10-CM | POA: Diagnosis not present

## 2023-02-21 DIAGNOSIS — R627 Adult failure to thrive: Secondary | ICD-10-CM | POA: Diagnosis not present

## 2023-02-21 DIAGNOSIS — Z7189 Other specified counseling: Secondary | ICD-10-CM | POA: Diagnosis not present

## 2023-02-21 DIAGNOSIS — Z515 Encounter for palliative care: Secondary | ICD-10-CM | POA: Diagnosis not present

## 2023-02-21 DIAGNOSIS — G9341 Metabolic encephalopathy: Secondary | ICD-10-CM | POA: Diagnosis not present

## 2023-02-21 DIAGNOSIS — R54 Age-related physical debility: Secondary | ICD-10-CM

## 2023-02-21 LAB — CBC
HCT: 32.3 % — ABNORMAL LOW (ref 36.0–46.0)
Hemoglobin: 9.3 g/dL — ABNORMAL LOW (ref 12.0–15.0)
MCH: 21.6 pg — ABNORMAL LOW (ref 26.0–34.0)
MCHC: 28.8 g/dL — ABNORMAL LOW (ref 30.0–36.0)
MCV: 75.1 fL — ABNORMAL LOW (ref 80.0–100.0)
Platelets: 262 10*3/uL (ref 150–400)
RBC: 4.3 MIL/uL (ref 3.87–5.11)
RDW: 15.7 % — ABNORMAL HIGH (ref 11.5–15.5)
WBC: 7.6 10*3/uL (ref 4.0–10.5)
nRBC: 0 % (ref 0.0–0.2)

## 2023-02-21 LAB — COMPREHENSIVE METABOLIC PANEL
ALT: 12 U/L (ref 0–44)
AST: 17 U/L (ref 15–41)
Albumin: 3.3 g/dL — ABNORMAL LOW (ref 3.5–5.0)
Alkaline Phosphatase: 63 U/L (ref 38–126)
Anion gap: 9 (ref 5–15)
BUN: 10 mg/dL (ref 8–23)
CO2: 24 mmol/L (ref 22–32)
Calcium: 9.1 mg/dL (ref 8.9–10.3)
Chloride: 108 mmol/L (ref 98–111)
Creatinine, Ser: 0.8 mg/dL (ref 0.44–1.00)
GFR, Estimated: >60 mL/min (ref >60)
Glucose, Bld: 132 mg/dL — ABNORMAL HIGH (ref 70–99)
Potassium: 3.4 mmol/L — ABNORMAL LOW (ref 3.5–5.1)
Sodium: 141 mmol/L (ref 135–145)
Total Bilirubin: 0.7 mg/dL (ref <1.2)
Total Protein: 6.6 g/dL (ref 6.5–8.1)

## 2023-02-21 LAB — PHOSPHORUS: Phosphorus: 2.6 mg/dL (ref 2.5–4.6)

## 2023-02-21 LAB — MAGNESIUM: Magnesium: 1.9 mg/dL (ref 1.7–2.4)

## 2023-02-21 MED ORDER — ORAL CARE MOUTH RINSE
15.0000 mL | OROMUCOSAL | Status: DC
  Administered 2023-02-21 – 2023-02-22 (×7): 15 mL via OROMUCOSAL

## 2023-02-21 MED ORDER — ORAL CARE MOUTH RINSE
15.0000 mL | OROMUCOSAL | Status: DC | PRN
Start: 2023-02-21 — End: 2023-02-23

## 2023-02-21 MED ORDER — DIPHENHYDRAMINE HCL 50 MG/ML IJ SOLN: 25.0000 mg | INTRAMUSCULAR | Status: DC

## 2023-02-21 MED ORDER — LORAZEPAM 2 MG/ML IJ SOLN
0.5000 mg | INTRAMUSCULAR | Status: AC
  Administered 2023-02-21: 0.5 mg via INTRAVENOUS
  Filled 2023-02-21: qty 1

## 2023-02-21 MED ORDER — LEVETIRACETAM IN NACL 500 MG/100ML IV SOLN
500.0000 mg | Freq: Two times a day (BID) | INTRAVENOUS | Status: DC
  Administered 2023-02-21 – 2023-02-27 (×13): 500 mg via INTRAVENOUS
  Filled 2023-02-21 (×13): qty 100

## 2023-02-21 MED ORDER — POTASSIUM CHLORIDE 10 MEQ/100ML IV SOLN
10.0000 meq | INTRAVENOUS | Status: AC
  Administered 2023-02-21 (×3): 10 meq via INTRAVENOUS
  Filled 2023-02-21 (×3): qty 100

## 2023-02-21 MED ORDER — LORAZEPAM 2 MG/ML IJ SOLN
1.0000 mg | Freq: Four times a day (QID) | INTRAMUSCULAR | Status: DC | PRN
  Administered 2023-02-21 – 2023-02-23 (×4): 1 mg via INTRAVENOUS
  Filled 2023-02-21 (×5): qty 1

## 2023-02-21 MED ORDER — DIPHENHYDRAMINE HCL 50 MG/ML IJ SOLN
25.0000 mg | INTRAMUSCULAR | Status: AC
  Administered 2023-02-21: 25 mg via INTRAVENOUS
  Filled 2023-02-21: qty 1

## 2023-02-21 NOTE — Plan of Care (Signed)
  Problem: Coping: Goal: Will identify appropriate support needs 02/21/2023 1711 by Tharon Aquas, RN Outcome: Progressing 02/21/2023 1644 by Tharon Aquas, RN Outcome: Progressing   Problem: Health Behavior/Discharge Planning: Goal: Goals will be collaboratively established with patient/family Outcome: Progressing   Problem: Nutrition: Goal: Risk of aspiration will decrease 02/21/2023 1711 by Tharon Aquas, RN Outcome: Progressing 02/21/2023 1644 by Tharon Aquas, RN Outcome: Progressing Goal: Dietary intake will improve Outcome: Progressing   Problem: Clinical Measurements: Goal: Respiratory complications will improve 02/21/2023 1711 by Tharon Aquas, RN Outcome: Progressing 02/21/2023 1644 by Tharon Aquas, RN Outcome: Progressing Goal: Cardiovascular complication will be avoided 02/21/2023 1711 by Tharon Aquas, RN Outcome: Progressing 02/21/2023 1644 by Tharon Aquas, RN Outcome: Progressing   Problem: Elimination: Goal: Will not experience complications related to urinary retention 02/21/2023 1711 by Tharon Aquas, RN Outcome: Progressing 02/21/2023 1644 by Tharon Aquas, RN Outcome: Progressing

## 2023-02-21 NOTE — Consult Note (Signed)
[facility] Liaison Consult Note   02/21/2023  Terresa Khang Hollingshed [birth year] [id number]  [facility] Patient: showing active with [facility] RN CC  Primary Care Provider:  Kerri Perches, MD ,this provider is listed to provide the community transition of care follow up and [facility] calls  Insurance: [name] [noted Medicaid]  Patient is currently active with [facility] for care coordination services.  Patient has been engaged by a [employer].  The community based plan of care has focused on disease management and community resource support.   Patient was residing at [facility] for rehab, and admitted for Mental status changes went to [facility] and obtunded and transferred to [facility]. Admitted with Seizures.  Review reveals patient/family wants LTC under her Medicaid if possible.  Plan: Will reach out to [facility] RN CC for updates. Continue to follow for disposition and any additional community care coordination needs for post hospital/community needs.   Of note, [facility] services does not replace or interfere with any services that are needed or arranged by inpatient [facility] care management team.   Charlesetta Shanks, RN, BSN, CCM [place]  [facility], Population Health, [facility] Liaison Direct Dial: [phone] or secure chat Email: [email or web address]

## 2023-02-21 NOTE — Progress Notes (Signed)
Speech Language Pathology Treatment: Dysphagia  Patient Details Name: [name] MRN: [id number] DOB: 03-02-1951 Today's Date: 02/21/2023 Time: 1215-1223 SLP Time Calculation (min) (ACUTE ONLY): 8 min  Assessment / Plan / Recommendation Clinical Impression  Pt seen for ongoing dysphagia management. Pt with waxing/waning LOA.  Mitt noted on R hand. Pt exhibited good tolerance of thin liquids and puree trials. Although, puree trials were limited to x2 given increasing lethargy. Pt with significant oral holding in anterior oral cavity with limited-to-no attempt at bolus manipulation with soft solid trial. Suspect pt's cognitive status is impacting oral swallow as well as her edentulism. Noted consultation for cognitive-linguistic evaluation pending. Unable to complete given pt's LOA.  SLP will follow x1 for possible diet advancement and cognitive-linguistic evaluation pending improvements in LOA.   HPI HPI: [name]  a 72 year old woman who was brought in by EMS from facility for agitation. Head CT 12/7 with no acute findings. No chest imaging.  Pt with a history of CKD, bipolar disorder, bifrontal meningioma status postresection, COPD, hyperlipidemia, hypertension, hypothyroidism, seizure disorder, GERD.      SLP Plan  Continue with current plan of care      Recommendations for follow up therapy are one component of a multi-disciplinary discharge planning process, led by the attending physician.  Recommendations may be updated based on patient status, additional functional criteria and insurance authorization.    Recommendations  Diet recommendations: Dysphagia 1 (puree);Thin liquid Medication Administration:  (as tolerated, can crush if needed) Supervision: Staff to assist with self feeding Compensations: Slow rate;Small sips/bites Postural Changes and/or Swallow Maneuvers: Seated upright 90 degrees                  Oral care BID   Frequent or constant  Supervision/Assistance Dysphagia, oral phase (R13.11)     Continue with current plan of care     [facility]  Clyde Canterbury, M.S., CCC-SLP Speech-Language Pathologist [facility] [phone] (ASCOM)   02/21/2023, 1:09 PM

## 2023-02-21 NOTE — Consult Note (Signed)
Consultation Note Date: 02/21/2023   Patient Name: [name]  DOB: Jul 22, 1950  MRN: [id number]  Age / Sex: 72 y.o., female  PCP: Kerri Perches, MD Referring Physician: [place], Eric J, DO  Reason for Consultation: Establishing goals of care, "14F admitted with seizure/UTI, now with adult failure to thrive, refusing medications.  Please assist with goals of care/medical decision making.  Would benefit from transitioning to hospice."  HPI/Patient Profile: 72 y.o. female  with past medical history of DM2, HTN, HLD, chronic smoking, OSA on CPAP, stroke, COPD hypothyroidism, seizure, bipolar disorder, fibromyalgia, arthritis, diverticulosis, GERD, and chronic anemia  was admitted on 02/17/2023 with acute metabolic encephalopathy/concern for seizures versus CVA.  Of note, patient has had 3 admissions and 4 ED visits in the last 6 months.  She is a 30-day readmission.  Most recent hospitalization was due to encephalopathy, AKI, and seizure disorders - she was discharged to SNF rehab.  Clinical Assessment and Goals of Care: I have reviewed medical records including EPIC notes, labs, available advanced directive documents, and imaging. Received report from primary RN -no acute concerns.  RN reports that patient has been impulsive, drowsy after administration of Ativan, took medications, not eating with minimal sips of liquid.  Went to visit patient at bedside - son/Corey present. Patient was lying in bed asleep.  She does briefly arouse to voice/gentle touch but quickly falls back asleep.  She is not able to participate in goals of care or complex medical decision making.  No signs or non-verbal gestures of pain or discomfort noted. No respiratory distress, increased work of breathing, or secretions noted.   Met with Denyse Amass to discuss diagnosis, prognosis, GOC, EOL wishes, disposition, and options.  I introduced  Palliative Medicine as specialized medical care for people living with serious illness. It focuses on providing relief from the symptoms and stress of a serious illness. The goal is to improve quality of life for both the patient and the family.  We discussed a brief life review of the patient as well as functional and nutritional status.  Patient is widowed -she has 1 son.  Therapeutic listening provided as Denyse Amass outlines patient's gradual progressive decline since her shoulder surgery earlier this year during the summer.  She has not been finding benefit or doing well at rehab.  She has had increased episodes of falling and hitting her head.  Prior to this hospitalization, patient was at [facility] for rehab.  Denyse Amass expresses he does not want patient returning there due to concerns for her care.  Patient was able to take a couple of steps with her walker, but was still falling and even sliding out of her wheelchair.  Denyse Amass tells me that patient is a "finicky eater" and "loved eating things that she could not have."  Denyse Amass expresses noticing her decline over the last several months.  He has had a difficult time navigating advanced care planning for her -she has continuously stated that she wants to go home; however, he recognizes that this is not a safe environment for her.  We discussed patient's current illness and what it means in the larger context of patient's on-going co-morbidities.  Denyse Amass has a clear understanding of her current acute medical situation. Natural disease trajectory and expectations at EOL were discussed. I attempted to elicit values and goals of care important to the patient. The difference between aggressive medical intervention and comfort care was considered in light of the patient's goals of care.   Provided education  and counseling at length on the philosophy and benefits of hospice care. Discussed that it offers a holistic approach to care in the setting of end-stage illness,  and is about supporting the patient where they are allowing nature to take it's course. Discussed the hospice team includes RNs, physicians, social workers, and chaplains. They can provide personal care, support for the family, and help keep patient out of the hospital as well as assist with DME needs for home hospice. Education provided on the difference between home vs residential hospice.  Denyse Amass understands that patient at this time would require 24/7 supervision/assistance -he is not able to provide this at his home because he works.  He is interested in LTC placement with hospice.  We talked about transition to comfort measures in house and what that would entail inclusive of medications to control pain, dyspnea, agitation, nausea, and itching. We discussed stopping all unnecessary measures such as blood draws, needle sticks, oxygen, antibiotics, CBGs/insulin, cardiac monitoring, IVF, and frequent vital signs. Education provided that other non-pharmacological interventions would be utilized for holistic support and comfort such as spiritual support if requested, repositioning, music therapy, offering comfort feeds, and/or therapeutic listening. All care would focus on how the patient is looking and feeling.  Denyse Amass opts for patient's transition to full comfort measures today.  Encouraged patient/family to consider DNR/DNI status understanding evidenced based poor outcomes in similar hospitalized patient, as the cause of arrest is likely associated with advanced chronic/terminal illness rather than an easily reversible acute cardio-pulmonary event.  I shared that even if we pursued resuscitation we would not able to resolve the underlying factors. I explained that DNR/DNI only comes into effect after a person has arrested (died).  It is a protective measure to keep [name] from harming the patient in their last moments of life. Denyse Amass expresses understanding for DNR/DNI recommendation in light of patient's current  situation and decisions for hospice/comfort; however, he does not want to make a final decision regarding this without speaking to his family first because patient has stated in the past she would want attempts at resuscitation.   In light of continuing full code at this time, will continue current gentle treatments without escalation of care. He is open for PMT follow up tomorrow regarding code status.  Advance directives, concepts specific to code status, artificial feeding and hydration, and rehospitalization were considered and discussed. Denyse Amass confirms he is patient's HCPOA - documents noted in [place].  Visit also consisted of discussions dealing with the complex and emotionally intense issues of symptom management and palliative care in the setting of serious and potentially life-threatening illness. Palliative care team will continue to support patient, patient's family, and medical team.  Discussed with son the importance of continued conversation with other family and the medical providers regarding overall plan of care and treatment options, ensuring decisions are within the context of the patient's values and GOCs.    Questions and concerns were addressed. The patient/family was encouraged to call with questions and/or concerns. PMT card was provided.   Primary Decision Maker: HCPOA - son/ Rosalio Loud    SUMMARY OF RECOMMENDATIONS   Continue current gentle treatments without escalation of care, main focus on comfort Continue full code for now - son will discuss DNR/DNI with family tonight prior to making final decisions Goal is for patient's discharge to LTC with hospice - TOC notified and consult placed. Patient may qualify for residential facility soon Ordered ativan 1mg  q6h PRN anxiety, restlessness  PMT will  continue to follow and support holistically   Code Status/Advance Care Planning: Full code  Palliative Prophylaxis:  Aspiration, Bowel Regimen, Delirium Protocol,  Frequent Pain Assessment, Oral Care, and Turn Reposition  Additional Recommendations (Limitations, Scope, Preferences): Full Scope Treatment  Psycho-social/Spiritual:  Desire for further Chaplaincy support:no Created space and opportunity for patient and family to express thoughts and feelings regarding patient's current medical situation.  Emotional support and therapeutic listening provided.  Prognosis:  Poor in the setting of advanced age, recurrent hospitalizations, and multiple comorbidities, frailty/FTT  Discharge Planning: Skilled Nursing Facility with Hospice      Primary Diagnoses: Present on Admission:  AMS (altered mental status)  Acquired hypothyroidism  Acute metabolic encephalopathy  Anemia in chronic kidney disease (CKD)  Benign meningioma of brain (HCC)  Chronic obstructive pulmonary disease (HCC)  CKD (chronic kidney disease) stage 3, GFR 30-59 ml/min (HCC)  Diabetic polyneuropathy associated with type 2 diabetes mellitus (HCC)   I have reviewed the medical record, interviewed the patient and family, and examined the patient. The following aspects are pertinent.  Past Medical History:  Diagnosis Date   Allergy    Anemia    Anemia in chronic kidney disease (CKD) 08/10/2021   Anemia in chronic kidney disease (CKD) 10/12/2021   Anxiety    takes Ativan daily   Arthritis    Assistance needed for mobility    Bipolar disorder (HCC)    takes Risperdal nightly   Blood transfusion    Brain tumor (HCC)    Cancer (HCC)    In her gum   Carpal tunnel syndrome of right wrist 05/23/2011   Cervical disc disorder with radiculopathy of cervical region 10/31/2012   Chronic back pain    Chronic idiopathic constipation    Chronic neck and back pain    Colon polyps    COPD (chronic obstructive pulmonary disease) with chronic bronchitis (HCC) 09/16/2013   Office Spirometry 10/30/2013-submaximal effort based on appearance of loop and curve. Numbers would fit with severe  restriction but her physiologic capability may be better than this. FVC 0.91/44%, and 10.74/45%, FEV1/FVC 0.81, FEF 25-75% 1.43/69%     Diabetes mellitus    Type II   Diverticulosis    TCS 9/08 by Dr. Lina Sar for diarrhea . Bx for micro scopic colitis negative.    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   Metabolic encephalopathy 08/03/2011   Migraines    chronic headaches   Mononeuritis lower limb    Narcolepsy    Osteoporosis    Pancreatitis 2006   due to Depakote with normal EUS    Paralysis (HCC)    Pneumonia    Schatzki's ring    non critical / EGD with ED 8/2011with RMR   Seizures (HCC)    takes Lamictal daily.Last seizure 3 yrs ago   Sleep apnea    on CPAP   Small bowel obstruction (HCC)  Stroke [facility])    left sided weakness, speech changes   Tubular adenoma of colon    Social History   Socioeconomic History   Marital status: Legally Separated    Spouse name: Not on file   Number of children: 1   Years of education: 12   Highest education level: High school graduate  Occupational History   Occupation: Disabled  Tobacco Use   Smoking status: Some Days    Current packs/day: 0.00    Average packs/day: 0.5 packs/day for 47.0 years (23.5 ttl pk-yrs)    Types: Cigarettes    Start date: 07/06/1974    Last attempt to quit: 07/05/2021    Years since quitting: 1.6    Passive exposure: Past   Smokeless tobacco: Never   Tobacco comments:    3-4 a day   Vaping Use   Vaping status:  Never Used  Substance and Sexual Activity   Alcohol use: No    Alcohol/week: 0.0 standard drinks of alcohol    Comment:     Drug use: No   Sexual activity: Not Currently  Other Topics Concern   Not on file  Social History Narrative   01/29/18 Lives alone, has 3 aides, Mon- Fri 8 hrs, 2 hrs on Sat-Sun, RN manages her meds   Caffeine use: Drink coffee sometimes    Right handed    Social Determinants of Health   Financial Resource Strain: Low Risk  (07/17/2022)   Overall Financial Resource Strain (CARDIA)    Difficulty of Paying Living Expenses: Not very hard  Food Insecurity: No Food Insecurity (02/18/2023)   Hunger Vital Sign    Worried About Running Out of Food in the Last Year: Never true    Ran Out of Food in the Last Year: Never true  Transportation Needs: No Transportation Needs (02/18/2023)   PRAPARE - Administrator, Civil Service (Medical): No    Lack of Transportation (Non-Medical): No  Physical Activity: Insufficiently Active (07/17/2022)   Exercise Vital Sign    Days of Exercise per Week: 2 days    Minutes of Exercise per Session: 20 min  Stress: No Stress Concern Present (07/17/2022)   [employer] of Occupational Health - Occupational Stress Questionnaire    Feeling of Stress : Not at all  Social Connections: Moderately Integrated (07/17/2022)   Social Connection and Isolation Panel [NHANES]    Frequency of Communication with Friends and Family: Three times a week    Frequency of Social Gatherings with Friends and Family: Once a week    Attends Religious Services: More than 4 times per year    Active Member of [employer] or Organizations: Yes    Attends Engineer, structural: More than 4 times per year    Marital Status: Divorced   Family History  Problem Relation Age of Onset   Heart attack Mother        HTN   Pneumonia Father    Kidney failure Father    Diabetes Father    Pancreatic cancer Sister    Cancer Sister        breast    Cancer  Sister        pancreatic   Diabetes Brother    Hypertension Brother    Diabetes Brother    Alcohol abuse Maternal Uncle    Stroke Maternal Grandmother    Heart attack Maternal Grandfather    Hypertension Son    Sleep apnea Son    Colon cancer Neg  Hx    Anesthesia problems Neg Hx    Hypotension Neg Hx    Malignant hyperthermia Neg Hx    Pseudochol deficiency Neg Hx    Breast cancer Neg Hx    Stomach cancer Neg Hx    Scheduled Meds:   stroke: early stages of recovery book   Does not apply Once   amLODipine  5 mg Oral Daily   busPIRone  5 mg Oral TID   carvedilol  12.5 mg Oral BID WC   cycloSPORINE  1 drop Both Eyes Daily   heparin  5,000 Units Subcutaneous Q8H   lamoTRIgine  150 mg Oral BID   [START ON 02/25/2023] levothyroxine  25 mcg Oral Q Sun   And   levothyroxine  50 mcg Oral Once per day on Monday Tuesday Wednesday Thursday Friday Saturday   mouth rinse  15 mL Mouth Rinse 4 times per day   sertraline  100 mg Oral q morning   Continuous Infusions:  ciprofloxacin 400 mg (02/21/23 1014)   levETIRAcetam Stopped (02/21/23 0817)   PRN Meds:.acetaminophen **OR** acetaminophen (TYLENOL) oral liquid 160 mg/5 mL **OR** acetaminophen, albuterol, alum & mag hydroxide-simeth, guaiFENesin-dextromethorphan, hydrALAZINE, mouth rinse, senna-docusate Medications Prior to Admission:  Prior to Admission medications   Medication Sig Start Date End Date Taking? Authorizing Provider  acetaminophen (TYLENOL) 500 MG tablet Take 1,000 mg by mouth every 8 (eight) hours as needed for mild pain (pain score 1-3), moderate pain (pain score 4-6) or headache.   Yes [provider]  alendronate (FOSAMAX) 70 MG tablet TAKE 1 TABLET EVERY 7 DAYS ON AN EMPTY STOMACH WITH A FULL GLASS OF WATER Patient taking differently: Take 70 mg by mouth every Monday. 11/07/21  Yes Kerri Perches, MD  amLODipine (NORVASC) 5 MG tablet Take 1 tablet (5 mg total) by mouth daily. 01/17/23  Yes TatOnalee Hua, MD   aspirin EC 81 MG tablet Take 1 tablet (81 mg total) by mouth daily with breakfast. 12/08/18  Yes Emokpae, Courage, MD  Blood Glucose Calibration (ACCU-CHEK GUIDE CONTROL) LIQD USE AS DIRECTED 12/13/20  Yes Carlus Pavlov, MD  blood glucose meter kit and supplies Dispense based on patient and insurance preference. Use up to four times daily as directed. (FOR ICD-10 E10.9, E11.9). 06/27/19  Yes Carlus Pavlov, MD  busPIRone (BUSPAR) 5 MG tablet Take 5 mg by mouth 3 (three) times daily.   Yes [provider]  carvedilol (COREG) 12.5 MG tablet Take 12.5 mg by mouth 2 (two) times daily with a meal.   Yes [provider]  Cholecalciferol 10 MCG (400 UNIT) CHEW Chew 1 tablet by mouth daily.   Yes [provider]  Continuous Blood Gluc Sensor (FREESTYLE LIBRE 14 DAY SENSOR) MISC 1 each by Does not apply route every 14 (fourteen) days. Change every 2 weeks 06/24/19  Yes Carlus Pavlov, MD  Docusate Sodium (DSS) 100 MG CAPS Take 100 mg by mouth in the morning and at bedtime.   Yes [provider]  DROPLET PEN NEEDLES 31G X 8 MM MISC USE FOR INJECTING INSULIN 4 TIMES DAILY. 09/02/21  Yes Carlus Pavlov, MD  ezetimibe (ZETIA) 10 MG tablet TAKE 1 TABLET EVERY DAY 01/19/22  Yes Kerri Perches, MD  glucose blood (ACCU-CHEK GUIDE) test strip Use as instructed 12/12/22  Yes Carlus Pavlov, MD  insulin aspart (NOVOLOG FLEXPEN) 100 UNIT/ML FlexPen Inject 2-8 Units into the skin See admin instructions. Per Sliding Scale:  if 200 - 250 = 2 units 251 -  300 = 4 units 301 - 350 = 6 units 351 - 400 = 8 units  > 400 = Contact MD   Yes [provider]  insulin glargine-yfgn (SEMGLEE) 100 UNIT/ML injection Inject 0.1 mLs (10 Units total) into the skin at bedtime. 01/26/23  Yes Dahal, Melina Schools, MD  ipratropium-albuterol (DUONEB) 0.5-2.5 (3) MG/3ML SOLN Take 3 mLs by nebulization every 6 (six) hours as needed. 12/08/22  Yes Young, Joni Fears D, MD  ketoconazole (NIZORAL) 2  % shampoo Apply 1 Application topically 2 (two) times a week. Patient taking differently: Apply 1 Application topically every 3 (three) days. 10/16/22  Yes Kerri Perches, MD  lamoTRIgine (LAMICTAL) 150 MG tablet Take 1 tablet (150 mg total) by mouth 2 (two) times daily. 01/26/23 02/25/23 Yes Dahal, Melina Schools, MD  levothyroxine (SYNTHROID) 50 MCG tablet TAKE 1 TABLET DAILY SIX DAYS A WEEK AND 1/2 TABLET ON ONE DAY A WEEK Patient taking differently: Take 25-50 mcg by mouth See admin instructions. Take 1/2 tablet by mouth every Sunday, then take 1 tablet all other days 11/15/22  Yes Carlus Pavlov, MD  LORazepam (ATIVAN) 0.5 MG tablet Take 0.5 mg by mouth 2 (two) times daily.   Yes [provider]  melatonin 3 MG TABS tablet Take 1 tablet (3 mg total) by mouth at bedtime as needed. Patient taking differently: Take 3 mg by mouth at bedtime as needed (for sleep). 01/26/23  Yes Dahal, Melina Schools, MD  montelukast (SINGULAIR) 10 MG tablet Take 10 mg by mouth daily. 10/27/22  Yes [provider]  Multiple Vitamins-Minerals (MULTIVITAMIN GUMMIES ADULT PO) Take 1 tablet by mouth daily.   Yes [provider]  omeprazole (PRILOSEC) 20 MG capsule Take 20 mg by mouth daily.   Yes [provider]  ondansetron (ZOFRAN) 4 MG tablet Take one tablet by mouth once daily, as needed , for nausea 12/12/22  Yes Kerri Perches, MD  oxybutynin (DITROPAN-XL) 10 MG 24 hr tablet Take 10 mg by mouth daily.   Yes [provider]  polyethylene glycol (MIRALAX / GLYCOLAX) 17 g packet Take 17 g by mouth daily as needed. Patient taking differently: Take 17 g by mouth daily as needed for mild constipation. 01/26/23  Yes Dahal, Melina Schools, MD  pregabalin (LYRICA) 75 MG capsule Take 1 capsule (75 mg total) by mouth daily. 01/26/23 02/25/23 Yes Dahal, Melina Schools, MD  RESTASIS 0.05 % ophthalmic emulsion Place 2 drops into both eyes daily. 07/29/20  Yes [provider]  rosuvastatin (CRESTOR) 5  MG tablet TAKE 1 TABLET AT BEDTIME 06/12/22  Yes Kerri Perches, MD  senna-docusate (SENOKOT-S) 8.6-50 MG tablet Take 1 tablet by mouth 2 (two) times daily. 01/26/23  Yes Dahal, Melina Schools, MD  sertraline (ZOLOFT) 100 MG tablet Take 1 tablet (100 mg total) by mouth every morning. 01/26/23 02/25/23 Yes Dahal, Melina Schools, MD  vitamin E 180 MG (400 UNITS) capsule Take 400 Units by mouth daily.   Yes [provider]  Elastic Bandages & Supports (ADJUSTABLE ARM SLING) MISC L arm sling 05/04/22   Kerri Perches, MD  insulin aspart (NOVOLOG) 100 UNIT/ML injection Inject 0-5 Units into the skin at bedtime. Patient not taking: Reported on 02/18/2023 01/26/23   Lorin Glass, MD  insulin aspart (NOVOLOG) 100 UNIT/ML injection Inject 0-15 Units into the skin 3 (three) times daily with meals. Patient not taking: Reported on 02/18/2023 01/26/23   Lorin Glass, MD  insulin glargine, 2 Unit Dial, (TOUJEO MAX SOLOSTAR) 300 UNIT/ML Solostar Pen INJECT 20-26 UNITS INTO  THE SKIN DAILY. 02/13/23   Carlus Pavlov, MD  mirabegron ER (MYRBETRIQ) 50 MG TB24 tablet Take 1 tablet (50 mg total) by mouth daily. 12/18/22   Donnita Falls, FNP  Misc. Devices (MATTRESS PAD) MISC FIRM MATTRESS PAD for hospital bed x 1 02/22/21   Kerri Perches, MD  RABEprazole (ACIPHEX) 20 MG tablet TAKE 1 TABLET TWICE DAILY Patient not taking: Reported on 02/18/2023 12/28/22   Pyrtle, Carie Caddy, MD  UNABLE TO FIND Med Name:  G5 Mattress 07/31/22   Kerri Perches, MD   Allergies  Allergen Reactions   Iron Itching and Nausea And Vomiting   Milk (Cow) Rash and Other (See Comments)    Doesn't agree with the stomach, also    Penicillins Hives   Phenazopyridine Hives   Cephalexin Hives   Flonase [Fluticasone] Other (See Comments)    "It gave me ulcers in my nose"   Milk-Related Compounds Nausea Only and Other (See Comments)    Doesn't agree with the stomach   Review of Systems  Unable to perform ROS: Acuity of condition     Physical Exam Vitals and nursing note reviewed.  Constitutional:      General: She is not in acute distress.    Appearance: She is ill-appearing.  Pulmonary:     Effort: No respiratory distress.  Skin:    General: Skin is warm and dry.  Neurological:     Mental Status: She is lethargic.     Vital Signs: BP (!) 124/59 (BP Location: Left Arm)   Pulse 75   Temp 98 F (36.7 C)   Resp 16   Ht 4\' 11"  (1.499 m)   Wt 70 kg   SpO2 100%   BMI 31.17 kg/m  Pain Scale: Faces POSS *See Group Information*: 1-Acceptable,Awake and alert Pain Score: 0-No pain   SpO2: SpO2: 100 % O2 Device:SpO2: 100 % O2 Flow Rate: .   IO: Intake/output summary:  Intake/Output Summary (Last 24 hours) at 02/21/2023 1147 Last data filed at 02/21/2023 0200 Gross per 24 hour  Intake 588.89 ml  Output 600 ml  Net -11.11 ml    LBM: Last BM Date : 02/20/23 Baseline Weight: Weight: 70 kg Most recent weight: Weight: 70 kg     Palliative Assessment/Data: PPS 20%     Time In: 1145 Time Out: 1315 Time Total: 90 minutes  Signed by: Haskel Khan, NP   Please contact Palliative Medicine Team phone at [phone] for questions and concerns.  For individual provider: See Amion  *Portions of this note are a verbal dictation therefore any spelling and/or grammatical errors are due to the "[employer]agon [employer] One" system interpretation.

## 2023-02-21 NOTE — TOC Progression Note (Signed)
Transition of Care [facility]) - Progression Note    Patient Details  Name: [name] MRN: [id number] Date of Birth: 1951/01/23  Transition of Care [facility]) CM/SW Contact  Baldemar Lenis, [place] Phone Number: 02/21/2023, 2:09 PM  Clinical Narrative:   CSW completed referral and faxed patient out for long term care bed at SNF with hospice, awaiting offers. CSW to follow.    Expected Discharge Plan: Skilled Nursing Facility Barriers to Discharge: Continued Medical Work up  Expected Discharge Plan and Services     Post Acute Care Choice: Skilled Nursing Facility Living arrangements for the past 2 months: Single Family Home                                       Social Determinants of Health (SDOH) Interventions SDOH Screenings   Food Insecurity: No Food Insecurity (02/18/2023)  Housing: Patient Declined (02/18/2023)  Transportation Needs: No Transportation Needs (02/18/2023)  Utilities: Not At Risk (02/18/2023)  Alcohol Screen: Low Risk  (07/17/2022)  Depression (PHQ2-9): Low Risk  (12/12/2022)  Financial Resource Strain: Low Risk  (07/17/2022)  Physical Activity: Insufficiently Active (07/17/2022)  Social Connections: Moderately Integrated (07/17/2022)  Stress: No Stress Concern Present (07/17/2022)  Tobacco Use: High Risk (02/18/2023)  Health Literacy: Adequate Health Literacy (12/14/2022)    Readmission Risk Interventions    08/21/2022   11:54 AM 09/16/2021   10:07 AM  Readmission Risk Prevention Plan  Transportation Screening Complete Complete  Medication Review (RN Care Manager) Complete Complete  PCP or Specialist appointment within 3-5 days of discharge Complete   HRI or Home Care Consult Complete Complete  SW Recovery Care/Counseling Consult Complete Complete  Palliative Care Screening Not Applicable Not Applicable  Skilled Nursing Facility Not Applicable Patient Refused

## 2023-02-21 NOTE — Progress Notes (Signed)
PROGRESS NOTE    [name]  ZOX:[id number] DOB: 04-09-1950 DOA: 02/17/2023 PCP: Kerri Perches, MD    Brief Narrative:   [name] is a 72 y.o. female with past medical history significant for HTN, HLD, DM2, history of CVA, COPD, hypothyroidism, seizure disorder, history of meningioma s/p resection, bipolar disorder, fibromyalgia, osteoarthritis, diverticulosis, GERD, chronic anemia, OSA on CPAP with recent hospitalization due to encephalopathy, acute renal failure and seizure subsequent discharge to SNF who presented to [facility] ED on 12/7 via EMS for altered mental status.  Patient was currently obtunded, encephalopathic and history obtained from records, consultants and staff.  On arrival code stroke was initiated.  Apparently last known well approximately 4 PM this afternoon.  At baseline usually oriented x 2, ambulatory with a walker/cane.  Patient was transported the ED for further evaluation and management per EMS.  In the ED, temperature 98.4 F, HR 80, RR 22, BP 146/68, SpO2 94% on room air.  WBC 7.6, hemoglobin 10.3, platelet count 251.  Sodium 142, potassium 3.4, chloride 106, CO2 27, BUN 16, creat 1.30, glucose 108.  AST 19, ALT 14, total bilirubin 0.5.  INR 1.1.  EtOH level less than 10.  CT head without contrast with no hemorrhage or CT evidence of acute cortical infarct, postsurgical encephalomalacia along the medial frontal lobes bilaterally.  CT angiogram head/neck with no emergent large vessel occlusion, moderate stenosis left V4 segment.  Neurology was consulted.  She was deemed high risk for TNK and received Ativan for concern of seizures.  She was loaded with Keppra.  Received aspirin per rectum.  TRH consulted for admission and patient was transferred ED to ED to [facility] for LTM EEG and neurology evaluation.  Assessment & Plan:   Acute metabolic encephalopathy Nonconvulsive seizure Concern for acute CVA: Ruled out Patient presenting from SNF  with change in mental status, agitation.  Last known normal 4 PM day of ED arrival.  Initially code stroke was initiated with CT head and CT angiogram head/neck unrevealing.  Also concern of breakthrough seizures and patient was given Ativan and loaded with Keppra.  EEG with no seizures or epileptiform discharges noted.  Neurology was consulted and followed during hospital course.  EEG discontinued 12/9.  MRI brain without contrast negative for acute infarct. -- Keppra 500 mg IV q12h -- Lamictal 150 mg p.o. twice daily -- Seizure precautions  UTI Urinalysis with moderate leukocytes, negative nitrite, rare bacteria, 11-20 WBCs.  WBC count elevated 10.7. -- Urine culture: Not collected -- Ciprofloxacin 400 mg IV every 12 hours (allergy to penicillin/cephalosporins)  Hypokalemia Potassium 3.4; will replete -- Repeat electrolytes in a.m.  Essential hypertension -- amlodipine 5 mg p.o. daily, carvedilol 12.5 m p.o. twice daily  Hyperlipidemia -- Hold home Zetia and Crestor for now  DM2 Home regimen includes Semglee 10 units [place] daily.  Hemoglobin C 5.8 01/13/2023. -- Hold insulin -- Continue monitor glucose intermittently  COPD Stable, on room air -- Albuterol neb every 4 hours as needed shortness of breath/wheezing  Hypothyroidism On levothyroxine 50 mcg p.o. daily except for Sundays, 25 mg.  Chronic anemia Hemoglobin 10.3, stable.  Anxiety/depression -- Sertraline 100 mg p.o. daily -- BuSpar 5 mg p.o. 3 times daily -- Hold home Ativan for now  Weakness/debility/deconditioning/gait disturbance Adult failure to thrive Per niece, patient has been slowly declining since rotator cuff surgery earlier this year.  Was initially at acute rehab and recently transition to long-term care given lack of progress.  Despite aggressive attempts, patient continues to further decline.  Discussed with niece at bedside that given her continued decline/demise, advanced age, chronic comorbidities it  would be recommended to transition to a more comfort approach with hospice.  She would like to discuss further with other family members. -- Palliative care consulted for assistance with goals of care and medical decision making   DVT prophylaxis: heparin injection 5,000 Units Start: 02/17/23 2200    Code Status: Full Code Family Communication: Updated niece present at bedside this morning  Disposition Plan:  Level of care: Telemetry Medical Status is: Inpatient Remains inpatient appropriate because: Pending return to SNF, niece not interested in return to [facility], [facility] consulted, also consulted palliative care for assistance with goals of care and Medical Decision Making as her overall prognosis poor/guarded with adult failure to thrive    Consultants:  Neurology Palliative care  Procedures:  EEG  Antimicrobials:  None   Subjective: Patient seen examined bedside, lying in bed.  Sleeping but arousable.  Continues to refuse medications, poor oral intake.  Agitated overnight.  Continues with gradual decline.  Patient denies headache, no chest pain, no shortness of breath, no abdominal pain.  Discussed with niece extensively regarding her ultimate gradual decline, confusion.  Discussed at this point given her advanced age, comorbidities that would recommend transitioning to hospice at this time.  She would like to discuss further with other family members.  Also niece is not interested in patient returning to [facility], so [facility] consulted.  Unable to gain any further ROS from patient due to her confusion.  No acute events overnight per nursing staff.  Objective: Vitals:   02/20/23 1141 02/20/23 1709 02/20/23 2331 02/21/23 0734  BP: (!) 149/67 (!) 165/70 (!) 164/64 137/71  Pulse: 75 87 84 77  Resp: 17 18  16   Temp: 97.8 F (36.6 C) 98 F (36.7 C) (!) 97.5 F (36.4 C) 97.9 F (36.6 C)  TempSrc: Oral Oral Axillary Oral  SpO2: 100% 97% 100%   Weight:      Height:         Intake/Output Summary (Last 24 hours) at 02/21/2023 1108 Last data filed at 02/21/2023 0200 Gross per 24 hour  Intake 588.89 ml  Output 600 ml  Net -11.11 ml   Filed Weights   02/17/23 1837  Weight: 70 kg    Examination:  Physical Exam: GEN: NAD, alert, confused, restless, chronically ill in appearance, appears older than stated age HEENT: NCAT, PERRL, EOMI, sclera clear, dry mucous membranes PULM: CTAB w/o wheezes/crackles, normal respiratory effort, on room air CV: RRR w/o M/G/R GI: abd soft, NTND, + BS MSK: no peripheral edema, moves all extremities independently NEURO: CN II-XII intact, no focal neurological deficit appreciated Integumentary: no concerning rashes/lesions/wounds noted on exposed skin surfaces    Data Reviewed: I have personally reviewed following labs and imaging studies  CBC: Recent Labs  Lab 02/17/23 1837 02/18/23 1553 02/19/23 0627 02/20/23 0456 02/21/23 0559  WBC 7.6 9.3 10.7* 10.0 7.6  NEUTROABS 4.6  --   --   --   --   HGB 10.3* 10.0* 9.3* 9.0* 9.3*  HCT 37.2 35.2* 32.2* 30.8* 32.3*  MCV 78.0* 76.9* 77.0* 75.3* 75.1*  PLT 251 226 220 240 262   Basic Metabolic Panel: Recent Labs  Lab 02/17/23 1837 02/18/23 1553 02/19/23 0627 02/20/23 0456 02/21/23 0559  NA 142 144 142 139 141  K 3.4* 3.2* 3.1* 3.4* 3.4*  CL 106 108 107 107  108  CO2 27 23 21* 23 24  GLUCOSE 108* 127* 111* 129* 132*  BUN 16 9 9 12 10   CREATININE 1.30* 0.87 0.84 0.79 0.80  CALCIUM 9.5 8.9 8.5* 8.7* 9.1  MG  --   --  1.8 2.3 1.9  PHOS  --   --   --   --  2.6   GFR: Estimated Creatinine Clearance: 54.1 mL/min (by C-G formula based on SCr of 0.8 mg/dL). Liver Function Tests: Recent Labs  Lab 02/17/23 1837 02/21/23 0559  AST 19 17  ALT 14 12  ALKPHOS 70 63  BILITOT 0.5 0.7  PROT 7.5 6.6  ALBUMIN 4.2 3.3*   No results for input(s): "LIPASE", "AMYLASE" in the last 168 hours. No results for input(s): "AMMONIA" in the last 168 hours. Coagulation  Profile: Recent Labs  Lab 02/17/23 1837  INR 1.1   Cardiac Enzymes: No results for input(s): "CKTOTAL", "CKMB", "CKMBINDEX", "TROPONINI" in the last 168 hours. BNP (last 3 results) No results for input(s): "PROBNP" in the last 8760 hours. HbA1C: Recent Labs    02/18/23 1553  HGBA1C 5.2   CBG: Recent Labs  Lab 02/17/23 1833 02/18/23 0049 02/18/23 0507 02/18/23 0742 02/20/23 0718  GLUCAP 89 93 107* 113* 173*   Lipid Profile: Recent Labs    02/18/23 1553  CHOL 102  HDL 41  LDLCALC 39  TRIG 110  CHOLHDL 2.5   Thyroid Function Tests: No results for input(s): "TSH", "T4TOTAL", "FREET4", "T3FREE", "THYROIDAB" in the last 72 hours. Anemia Panel: No results for input(s): "VITAMINB12", "FOLATE", "FERRITIN", "TIBC", "IRON", "RETICCTPCT" in the last 72 hours. Sepsis Labs: No results for input(s): "PROCALCITON", "LATICACIDVEN" in the last 168 hours.  No results found for this or any previous visit (from the past 240 hour(s)).       Radiology Studies: No results found.      Scheduled Meds:   stroke: early stages of recovery book   Does not apply Once   amLODipine  5 mg Oral Daily   busPIRone  5 mg Oral TID   carvedilol  12.5 mg Oral BID WC   cycloSPORINE  1 drop Both Eyes Daily   heparin  5,000 Units Subcutaneous Q8H   lamoTRIgine  150 mg Oral BID   [START ON 02/25/2023] levothyroxine  25 mcg Oral Q Sun   And   levothyroxine  50 mcg Oral Once per day on Monday Tuesday Wednesday Thursday Friday Saturday   mouth rinse  15 mL Mouth Rinse 4 times per day   sertraline  100 mg Oral q morning   Continuous Infusions:  ciprofloxacin 400 mg (02/21/23 1014)   levETIRAcetam Stopped (02/21/23 0817)   potassium chloride 10 mEq (02/21/23 1012)     LOS: 4 days    Time spent: 51 minutes spent on chart review, discussion with nursing staff, consultants, updating family and interview/physical exam; more than 50% of that time was spent in counseling and/or coordination of  care.    Alvira Philips [place], DO [facility] Available via Epic secure chat 7am-7pm After these hours, please refer to coverage provider listed on [email or web address] 02/21/2023, 11:08 AM

## 2023-02-21 NOTE — [place] FL2 (Signed)
[place] MEDICAID FL2 LEVEL OF CARE FORM     IDENTIFICATION  Patient Name: [name] Birthdate: 28-May-1950 Sex: female Admission Date (Current Location): 02/17/2023  [facility] and [place] Number:  [employer] and Address:  [facility] [place] [address], [place], [place] [address]      Provider Number: [id number]  Attending Physician Name and Address:  [place], Alvira Philips, DO  Relative Name and Phone Number:       Current Level of Care: Hospital Recommended Level of Care: Skilled Nursing Facility Prior Approval Number:    Date Approved/Denied:   PASRR Number: Manual review  Discharge Plan: SNF    Current Diagnoses: Patient Active Problem List   Diagnosis Date Noted   AMS (altered mental status) 02/17/2023   Acute metabolic encephalopathy 01/12/2023   History of CVA (cerebrovascular accident) 01/12/2023   Type 2 diabetes mellitus with diabetic chronic kidney disease (HCC) 12/14/2022   Lumbar radiculopathy 12/14/2022   Ataxia 10/25/2022   Lesion of skin of scalp 10/25/2022   Seborrheic dermatitis of scalp 10/15/2022   Dermatophytosis of nail 10/09/2022   Callus of foot 10/09/2022   Neuroma of foot 10/09/2022   History of shoulder surgery 10/04/2022   H/O total shoulder replacement, left 08/18/2022   Scalp cyst 08/10/2022   Right-sided chest pain 08/06/2022   CKD (chronic kidney disease) stage 3, GFR 30-59 ml/min (HCC) 07/03/2022   Skin tag 07/03/2022   Itchy scalp 07/03/2022   Left-sided chest pain 04/27/2022   Bilateral leg edema 12/21/2021   Hypoglycemia 12/20/2021   Skin lesions, generalized 12/19/2021   Insomnia due to stress 12/19/2021   Major depressive disorder 12/11/2021   Rotator cuff tear arthropathy 11/15/2021   Anemia in chronic kidney disease (CKD) 10/12/2021   Dehydration 09/25/2021   CHF (congestive heart failure) (HCC) 09/16/2021   Urinary frequency 09/01/2021   Anemia of chronic disease 08/10/2021    Generalized pain 08/04/2021   Sciatica, right side 07/19/2021   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/10/2021   Atherosclerosis of aorta (HCC) 07/10/2021   Generalized weakness 05/12/2021   Trigger finger, right 12/12/2020   Confusion 11/24/2020   Blurry vision 11/18/2020   Cervical radiculopathy 07/09/2020   Headache 02/19/2020   Tubular adenoma of colon 02/09/2019   Benign neoplasm of cerebral meninges (HCC) 11/12/2018   Benign meningioma of brain (HCC) 10/31/2018   Osteoarthritis of both hips 07/06/2018   Left arm pain 06/11/2018   Lipoma of extremity 12/31/2017   Impingement syndrome of left shoulder region 12/27/2017   Leg weakness, bilateral 10/28/2017   Lumbar spondylosis with myelopathy 10/12/2017   Numbness of hand 10/10/2017   Chronic neck pain 07/25/2017   Chronic pain syndrome 07/25/2017   Diabetic polyneuropathy associated with type 2 diabetes mellitus (HCC) 05/19/2016   Tobacco use disorder 04/24/2016   Obesity (BMI 30.0-34.9) 04/24/2016   History of palpitations 08/09/2015   Nausea 08/09/2015   Labile hypertension 08/03/2015   Normal coronary arteries 08/03/2015   Dizziness 07/15/2015   Left-sided low back pain with left-sided sciatica 06/27/2015   Type 2 diabetes mellitus with hyperglycemia, with long-term current use of insulin (HCC) 05/06/2015   Multinodular goiter 05/06/2015   Rectocele, female 04/27/2015   Anal sphincter incontinence 04/27/2015   Pelvic relaxation due to rectocele 03/30/2015   Hyperkalemia 02/22/2015   Pulmonary hypertension (HCC) 02/22/2015   Posterior chest pain 02/21/2015   Episodic cigarette smoking dependence 01/11/2015   Migraine  without aura and without status migrainosus, not intractable 07/02/2014   Microcytic anemia 02/18/2014   Hip pain, right 12/17/2013   COPD mixed type (HCC) 09/16/2013   Chronic obstructive pulmonary disease (HCC) 09/16/2013   Acquired  hypothyroidism 08/16/2013   Gastroparesis 04/28/2013   Nicotine dependence 03/09/2013   Seizure disorder (HCC) 01/19/2013   Displacement of cervical intervertebral disc without myelopathy 12/13/2012   Bursitis of shoulder 12/13/2012   Cervical disc disorder with radiculopathy of cervical region 10/31/2012   Solitary pulmonary nodule 08/19/2012   Hypersomnia disorder related to a known organic factor 06/11/2012   Pruritus 04/18/2012   Meningioma (HCC) 11/19/2011   Mononeuritis leg 10/25/2011   Carpal tunnel syndrome of right wrist 05/23/2011   Chronic pain of right hand 05/04/2011   Polypharmacy 04/28/2011   Mood disorder (HCC) 04/28/2011   Constipation 04/13/2011   Recurrent falls 12/12/2010   Oropharyngeal dysphagia 07/12/2010   Urinary incontinence 12/16/2009   HEARING LOSS 10/26/2009   DMII (diabetes mellitus, type 2) (HCC) 07/07/2009   Mixed hyperlipidemia 12/11/2008   IBS 12/11/2008   GERD 07/29/2008   MILK PRODUCTS ALLERGY 07/29/2008   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/03/2007   Essential hypertension 06/27/2007   Backache 06/19/2007   Osteoporosis 06/19/2007   Obstructive sleep apnea 06/19/2007   TRIGGER FINGER 04/18/2007   DIVERTICULOSIS, COLON 11/13/2006    Orientation RESPIRATION BLADDER Height & Weight     Self  Normal Incontinent Weight: 154 lb 5.2 oz (70 kg) Height:  4\' 11"  (149.9 cm)  BEHAVIORAL SYMPTOMS/MOOD NEUROLOGICAL BOWEL NUTRITION STATUS      Continent Diet (see DC summary)  AMBULATORY STATUS COMMUNICATION OF NEEDS Skin   Extensive Assist Verbally Normal                       Personal Care Assistance Level of Assistance  Bathing, Feeding, Dressing Bathing Assistance: Maximum assistance Feeding assistance: Limited assistance Dressing Assistance: Maximum assistance     Functional Limitations Info  Sight, Speech Sight Info: Impaired   Speech Info: Impaired    SPECIAL CARE FACTORS FREQUENCY                        Contractures Contractures Info: Not present    Additional Factors Info  Code Status, Allergies Code Status Info: Full Allergies Info: Iron, Milk (Cow), Penicillins, Phenazopyridine, Cephalexin, Flonase (Fluticasone), Milk-related Compounds Psychotropic Info: Buspar 5mg  3x/day; Zoloft 100mg  daily in the morning         Current Medications (02/21/2023):  This is the current hospital active medication list Current Facility-Administered Medications  Medication Dose Route Frequency Provider Last Rate Last Admin    stroke: early stages of recovery book   Does not apply Once Elgergawy, Leana Roe, MD       acetaminophen (TYLENOL) tablet 650 mg  650 mg Oral Q4H PRN Elgergawy, Leana Roe, MD   650 mg at 02/20/23 1609   Or   acetaminophen (TYLENOL) 160 MG/5ML solution 650 mg  650 mg Per Tube Q4H PRN Elgergawy, Leana Roe, MD       Or   acetaminophen (TYLENOL) suppository 650 mg  650 mg Rectal Q4H PRN Elgergawy, Leana Roe, MD       albuterol (PROVENTIL) (2.5 MG/3ML) 0.083% nebulizer solution 2.5 mg  2.5 mg Nebulization Q4H PRN [place], Eric J, DO       alum & mag hydroxide-simeth (MAALOX/MYLANTA) 200-200-20 MG/5ML suspension 30 mL  30 mL Oral Q4H  PRN [place], Eric J, DO   30 mL at 02/19/23 2357   amLODipine (NORVASC) tablet 5 mg  5 mg Oral Daily [place], Alvira Philips, DO   5 mg at 02/21/23 1610   busPIRone (BUSPAR) tablet 5 mg  5 mg Oral TID [place], Eric J, DO   5 mg at 02/21/23 0949   carvedilol (COREG) tablet 12.5 mg  12.5 mg Oral BID WC [place], Eric J, DO   12.5 mg at 02/21/23 9604   ciprofloxacin (CIPRO) IVPB 400 mg  400 mg Intravenous Q12H [place], Alvira Philips, DO 200 mL/hr at 02/21/23 1014 400 mg at 02/21/23 1014   cycloSPORINE (RESTASIS) 0.05 % ophthalmic emulsion 1 drop  1 drop Both Eyes Daily Elgergawy, Leana Roe, MD   1 drop at 02/21/23 1015   guaiFENesin-dextromethorphan (ROBITUSSIN DM) 100-10 MG/5ML syrup 5 mL  5 mL Oral Q4H PRN [place], Alvira Philips, DO       heparin injection 5,000 Units   5,000 Units Subcutaneous Q8H Elgergawy, Leana Roe, MD   5,000 Units at 02/21/23 1359   hydrALAZINE (APRESOLINE) injection 5 mg  5 mg Intravenous Q6H PRN [place], Alvira Philips, DO       lamoTRIgine (LAMICTAL) tablet 150 mg  150 mg Oral BID Rejeana Brock, MD   150 mg at 02/21/23 0949   levETIRAcetam (KEPPRA) IVPB 500 mg/100 mL premix  500 mg Intravenous BID [place], Eric J, DO   Stopped at 02/21/23 0817   [START ON 02/25/2023] levothyroxine (SYNTHROID) tablet 25 mcg  25 mcg Oral Q Sun Norina Buzzard, RPH       And   levothyroxine (SYNTHROID) tablet 50 mcg  50 mcg Oral Once per day on Monday Tuesday Wednesday Thursday Friday Saturday Norina Buzzard, RPH   50 mcg at 02/20/23 1438   LORazepam (ATIVAN) injection 1 mg  1 mg Intravenous Q6H PRN Haskel Khan, NP       Oral care mouth rinse  15 mL Mouth Rinse 4 times per day [place], Eric J, DO       Oral care mouth rinse  15 mL Mouth Rinse PRN [place], Eric J, DO       senna-docusate (Senokot-S) tablet 1 tablet  1 tablet Oral QHS PRN Elgergawy, Leana Roe, MD       sertraline (ZOLOFT) tablet 100 mg  100 mg Oral q morning [place], Eric J, DO   100 mg at 02/21/23 5409     Discharge Medications: Please see discharge summary for a list of discharge medications.  Relevant Imaging Results:  Relevant Lab Results:   Additional Information SS#: [id number]  Baldemar Lenis, LCSW

## 2023-02-21 NOTE — Plan of Care (Signed)
  Problem: Coping: Goal: Will identify appropriate support needs Outcome: Progressing   Problem: Nutrition: Goal: Risk of aspiration will decrease Outcome: Progressing Goal: Dietary intake will improve Outcome: Progressing   Problem: Clinical Measurements: Goal: Respiratory complications will improve Outcome: Progressing Goal: Cardiovascular complication will be avoided Outcome: Progressing   Problem: Elimination: Goal: Will not experience complications related to urinary retention Outcome: Progressing

## 2023-02-22 DIAGNOSIS — Z7189 Other specified counseling: Secondary | ICD-10-CM | POA: Diagnosis not present

## 2023-02-22 DIAGNOSIS — R638 Other symptoms and signs concerning food and fluid intake: Secondary | ICD-10-CM | POA: Diagnosis not present

## 2023-02-22 DIAGNOSIS — Z515 Encounter for palliative care: Secondary | ICD-10-CM | POA: Diagnosis not present

## 2023-02-22 DIAGNOSIS — R4182 Altered mental status, unspecified: Secondary | ICD-10-CM | POA: Diagnosis not present

## 2023-02-22 DIAGNOSIS — Z789 Other specified health status: Secondary | ICD-10-CM

## 2023-02-22 LAB — BASIC METABOLIC PANEL
Anion gap: 8 (ref 5–15)
BUN: 12 mg/dL (ref 8–23)
CO2: 26 mmol/L (ref 22–32)
Calcium: 9.1 mg/dL (ref 8.9–10.3)
Chloride: 105 mmol/L (ref 98–111)
Creatinine, Ser: 0.89 mg/dL (ref 0.44–1.00)
GFR, Estimated: >60 mL/min (ref >60)
Glucose, Bld: 120 mg/dL — ABNORMAL HIGH (ref 70–99)
Potassium: 3.5 mmol/L (ref 3.5–5.1)
Sodium: 139 mmol/L (ref 135–145)

## 2023-02-22 LAB — CBC
HCT: 33.1 % — ABNORMAL LOW (ref 36.0–46.0)
Hemoglobin: 9.7 g/dL — ABNORMAL LOW (ref 12.0–15.0)
MCH: 21.9 pg — ABNORMAL LOW (ref 26.0–34.0)
MCHC: 29.3 g/dL — ABNORMAL LOW (ref 30.0–36.0)
MCV: 74.7 fL — ABNORMAL LOW (ref 80.0–100.0)
Platelets: 287 10*3/uL (ref 150–400)
RBC: 4.43 MIL/uL (ref 3.87–5.11)
RDW: 15.6 % — ABNORMAL HIGH (ref 11.5–15.5)
WBC: 6.1 10*3/uL (ref 4.0–10.5)
nRBC: 0 % (ref 0.0–0.2)

## 2023-02-22 LAB — MAGNESIUM: Magnesium: 2 mg/dL (ref 1.7–2.4)

## 2023-02-22 MED ORDER — SALINE SPRAY 0.65 % NA SOLN: 1.0000 | NASAL | Status: DC | PRN

## 2023-02-22 NOTE — Plan of Care (Signed)
  Problem: Education: Goal: Knowledge of disease or condition will improve Outcome: Progressing Goal: Knowledge of secondary prevention will improve (MUST DOCUMENT ALL) Outcome: Progressing Goal: Knowledge of patient specific risk factors will improve [name] N/A or DELETE if not current risk factor) Outcome: Progressing   Problem: Ischemic Stroke/TIA Tissue Perfusion: Goal: Complications of ischemic stroke/TIA will be minimized Outcome: Progressing   Problem: Coping: Goal: Will verbalize positive feelings about self Outcome: Progressing Goal: Will identify appropriate support needs Outcome: Progressing   Problem: Health Behavior/Discharge Planning: Goal: Ability to manage health-related needs will improve Outcome: Progressing Goal: Goals will be collaboratively established with patient/family Outcome: Progressing   Problem: Self-Care: Goal: Ability to participate in self-care as condition permits will improve Outcome: Progressing Goal: Verbalization of feelings and concerns over difficulty with self-care will improve Outcome: Progressing Goal: Ability to communicate needs accurately will improve Outcome: Progressing   Problem: Nutrition: Goal: Risk of aspiration will decrease Outcome: Progressing Goal: Dietary intake will improve Outcome: Progressing   Problem: Education: Goal: Knowledge of General Education information will improve Description: Including pain rating scale, medication(s)/side effects and non-pharmacologic comfort measures Outcome: Progressing   Problem: Health Behavior/Discharge Planning: Goal: Ability to manage health-related needs will improve Outcome: Progressing   Problem: Clinical Measurements: Goal: Ability to maintain clinical measurements within normal limits will improve Outcome: Progressing Goal: Will remain free from infection Outcome: Progressing Goal: Diagnostic test results will improve Outcome: Progressing Goal: Respiratory  complications will improve Outcome: Progressing Goal: Cardiovascular complication will be avoided Outcome: Progressing   Problem: Activity: Goal: Risk for activity intolerance will decrease Outcome: Progressing   Problem: Nutrition: Goal: Adequate nutrition will be maintained Outcome: Progressing   Problem: Coping: Goal: Level of anxiety will decrease Outcome: Progressing   Problem: Elimination: Goal: Will not experience complications related to bowel motility Outcome: Progressing Goal: Will not experience complications related to urinary retention Outcome: Progressing   Problem: Pain Management: Goal: General experience of comfort will improve Outcome: Progressing   Problem: Safety: Goal: Ability to remain free from injury will improve Outcome: Progressing   Problem: Skin Integrity: Goal: Risk for impaired skin integrity will decrease Outcome: Progressing

## 2023-02-22 NOTE — Progress Notes (Signed)
Physical Therapy Discharge Patient Details Name: [name] MRN: [id number] DOB: 1950/03/27 Today's Date: 02/22/2023 Time:  -     Patient discharged from PT services secondary to  patient transitioning to comfort care with Hospice .  Please see latest therapy progress note for current level of functioning and progress toward goals.    Progress and discharge plan discussed with patient and/or caregiver: Patient unable to participate in discharge planning and no caregivers available  GP     Jerolyn Center, PT Acute Rehabilitation Services  Office [phone]  Zena Amos 02/22/2023, 8:37 AM

## 2023-02-22 NOTE — Progress Notes (Signed)
PROGRESS NOTE    Yuzuki Splain Riddles  WUX:[id number] DOB: 01/03/1951 DOA: 02/17/2023 PCP: Kerri Perches, MD    Brief Narrative:   [name] is a 72 y.o. female with past medical history significant for HTN, HLD, DM2, history of CVA, COPD, hypothyroidism, seizure disorder, history of meningioma s/p resection, bipolar disorder, fibromyalgia, osteoarthritis, diverticulosis, GERD, chronic anemia, OSA on CPAP with recent hospitalization due to encephalopathy, acute renal failure and seizure subsequent discharge to SNF who presented to [facility] ED on 12/7 via EMS for altered mental status.  Patient was currently obtunded, encephalopathic and history obtained from records, consultants and staff.  On arrival code stroke was initiated.  Apparently last known well approximately 4 PM this afternoon.  At baseline usually oriented x 2, ambulatory with a walker/cane.  Patient was transported the ED for further evaluation and management per EMS.  In the ED, temperature 98.4 F, HR 80, RR 22, BP 146/68, SpO2 94% on room air.  WBC 7.6, hemoglobin 10.3, platelet count 251.  Sodium 142, potassium 3.4, chloride 106, CO2 27, BUN 16, creat 1.30, glucose 108.  AST 19, ALT 14, total bilirubin 0.5.  INR 1.1.  EtOH level less than 10.  CT head without contrast with no hemorrhage or CT evidence of acute cortical infarct, postsurgical encephalomalacia along the medial frontal lobes bilaterally.  CT angiogram head/neck with no emergent large vessel occlusion, moderate stenosis left V4 segment.  Neurology was consulted.  She was deemed high risk for TNK and received Ativan for concern of seizures.  She was loaded with Keppra.  Received aspirin per rectum.  TRH consulted for admission and patient was transferred ED to ED to [facility] for LTM EEG and neurology evaluation.  Assessment & Plan:   Acute metabolic encephalopathy Nonconvulsive seizure Concern for acute CVA: Ruled out Patient presenting from SNF  with change in mental status, agitation.  Last known normal 4 PM day of ED arrival.  Initially code stroke was initiated with CT head and CT angiogram head/neck unrevealing.  Also concern of breakthrough seizures and patient was given Ativan and loaded with Keppra.  EEG with no seizures or epileptiform discharges noted.  Neurology was consulted and followed during hospital course.  EEG discontinued 12/9.  MRI brain without contrast negative for acute infarct. -- Keppra 500 mg IV q12h -- Lamictal 150 mg p.o. twice daily -- Seizure precautions  UTI Urinalysis with moderate leukocytes, negative nitrite, rare bacteria, 11-20 WBCs.  WBC count elevated 10.7. -- Urine culture: Not collected -- Ciprofloxacin 400 mg IV every 12 hours (allergy to penicillin/cephalosporins) 3 d course completed 12/11  Hypokalemia Potassium 3.5 this AM --Repeat BMP in the AM   Essential hypertension BP in 130s to 150s -- Continue amlodipine 5 mg p.o. daily, carvedilol 12.5 m p.o. twice daily  Hyperlipidemia -- Hold home Zetia and Crestor for now  DM2 Home regimen includes Semglee 10 units Aldine daily.  Hemoglobin C 5.8 01/13/2023. -- Hold insulin -- Continue monitor glucose intermittently  COPD Stable, on room air -- Albuterol neb every 4 hours as needed shortness of breath/wheezing  Hypothyroidism On levothyroxine 50 mcg p.o. daily except for Sundays, 25 mg.  Chronic anemia Hemoglobin 10.3, stable. Last had colonoscopy and EGD 10/2020.  which were unrevealing. Thought to be due to CKD and iron deficiency. Not on home PO iron.  --Will defer further investigation pending potential transition to comfort   Anxiety/depression -- Sertraline 100 mg p.o. daily -- BuSpar 5 mg  p.o. 3 times daily -- Hold home Ativan for now  Weakness/debility/deconditioning/gait disturbance Adult failure to thrive Per niece, patient has been slowly declining since rotator cuff surgery earlier this year.  Was initially at acute rehab  and recently transition to long-term care given lack of progress.  Despite aggressive attempts, patient continues to further decline.  Discussed with niece at bedside that given her continued decline/demise, advanced age, chronic comorbidities it would be recommended to transition to a more comfort approach with hospice.  She would like to discuss further with other family members. -- Palliative care consulted for assistance with goals of care and medical decision making   DVT prophylaxis: heparin injection 5,000 Units Start: 02/17/23 2200    Code Status: Full Code Family Communication: Updated niece present at bedside this morning  Disposition Plan:  Level of care: Telemetry Medical Status is: Inpatient Remains inpatient appropriate because: Pending return to SNF v LTC w/ hospice. TOC/Palliative discussing with family.    Consultants:  Neurology Palliative care  Procedures:  EEG  Antimicrobials:  None   Subjective: Patient seen examined bedside, lying in bed.  Resting but easily aroused by voice. Denies any pain.   Objective: Vitals:   02/22/23 0535 02/22/23 0843 02/22/23 1134 02/22/23 1617  BP: (!) 143/78 (!) 154/64 (!) 133/57 (!) 163/71  Pulse: 79 78 63 77  Resp: 18 17 18 19   Temp:  98.3 F (36.8 C) 98 F (36.7 C) 97.8 F (36.6 C)  TempSrc:  Oral Oral Oral  SpO2: 100% 97% 97% 98%  Weight:      Height:        Intake/Output Summary (Last 24 hours) at 02/22/2023 2013 Last data filed at 02/22/2023 1815 Gross per 24 hour  Intake 228 ml  Output 600 ml  Net -372 ml   Filed Weights   02/17/23 1837  Weight: 70 kg    Examination:  Constitutional: Well-developed, well-nourished, and in no distress.  Cardiovascular: Normal rate, regular rhythm. No lower extremity edema  Pulmonary: Non labored breathing on room air, no wheezing or rales  Abdominal: Soft. Normal bowel sounds. Non distended and non tender Neurological: Alert, Non focal  Skin: Skin is warm and dry.    Data Reviewed: I have personally reviewed following labs and imaging studies  CBC: Recent Labs  Lab 02/17/23 1837 02/18/23 1553 02/19/23 0627 02/20/23 0456 02/21/23 0559 02/22/23 0709  WBC 7.6 9.3 10.7* 10.0 7.6 6.1  NEUTROABS 4.6  --   --   --   --   --   HGB 10.3* 10.0* 9.3* 9.0* 9.3* 9.7*  HCT 37.2 35.2* 32.2* 30.8* 32.3* 33.1*  MCV 78.0* 76.9* 77.0* 75.3* 75.1* 74.7*  PLT 251 226 220 240 262 287   Basic Metabolic Panel: Recent Labs  Lab 02/18/23 1553 02/19/23 0627 02/20/23 0456 02/21/23 0559 02/22/23 0709  NA 144 142 139 141 139  K 3.2* 3.1* 3.4* 3.4* 3.5  CL 108 107 107 108 105  CO2 23 21* 23 24 26   GLUCOSE 127* 111* 129* 132* 120*  BUN 9 9 12 10 12   CREATININE 0.87 0.84 0.79 0.80 0.89  CALCIUM 8.9 8.5* 8.7* 9.1 9.1  MG  --  1.8 2.3 1.9 2.0  PHOS  --   --   --  2.6  --    GFR: Estimated Creatinine Clearance: 48.6 mL/min (by C-G formula based on SCr of 0.89 mg/dL). Liver Function Tests: Recent Labs  Lab 02/17/23 1837 02/21/23 0559  AST 19 17  ALT  14 12  ALKPHOS 70 63  BILITOT 0.5 0.7  PROT 7.5 6.6  ALBUMIN 4.2 3.3*   No results for input(s): "LIPASE", "AMYLASE" in the last 168 hours. No results for input(s): "AMMONIA" in the last 168 hours. Coagulation Profile: Recent Labs  Lab 02/17/23 1837  INR 1.1   Cardiac Enzymes: No results for input(s): "CKTOTAL", "CKMB", "CKMBINDEX", "TROPONINI" in the last 168 hours. BNP (last 3 results) No results for input(s): "PROBNP" in the last 8760 hours. HbA1C: No results for input(s): "HGBA1C" in the last 72 hours.  CBG: Recent Labs  Lab 02/17/23 1833 02/18/23 0049 02/18/23 0507 02/18/23 0742 02/20/23 0718  GLUCAP 89 93 107* 113* 173*   Lipid Profile: No results for input(s): "CHOL", "HDL", "LDLCALC", "TRIG", "CHOLHDL", "LDLDIRECT" in the last 72 hours.  Thyroid Function Tests: No results for input(s): "TSH", "T4TOTAL", "FREET4", "T3FREE", "THYROIDAB" in the last 72 hours. Anemia Panel: No  results for input(s): "VITAMINB12", "FOLATE", "FERRITIN", "TIBC", "IRON", "RETICCTPCT" in the last 72 hours. Sepsis Labs: No results for input(s): "PROCALCITON", "LATICACIDVEN" in the last 168 hours.  No results found for this or any previous visit (from the past 240 hours).       Radiology Studies: No results found.      Scheduled Meds:   stroke: early stages of recovery book   Does not apply Once   amLODipine  5 mg Oral Daily   busPIRone  5 mg Oral TID   carvedilol  12.5 mg Oral BID WC   cycloSPORINE  1 drop Both Eyes Daily   heparin  5,000 Units Subcutaneous Q8H   lamoTRIgine  150 mg Oral BID   [START ON 02/25/2023] levothyroxine  25 mcg Oral Q Sun   And   levothyroxine  50 mcg Oral Once per day on Monday Tuesday Wednesday Thursday Friday Saturday   mouth rinse  15 mL Mouth Rinse 4 times per day   sertraline  100 mg Oral q morning   Continuous Infusions:  levETIRAcetam 500 mg (02/22/23 0934)     LOS: 5 days    Time spent: 35 minutes spent on chart review, discussion with nursing staff, consultants, updating family and interview/physical exam; more than 50% of that time was spent in counseling and/or coordination of care.    Marolyn Haller, MD [facility] Available via Epic secure chat 7am-7pm After these hours, please refer to coverage provider listed on [email or web address] 02/22/2023, 8:13 PM

## 2023-02-22 NOTE — Progress Notes (Signed)
Daily Progress Note   Patient Name: [name]       Date: 02/22/2023 DOB: December 04, 1950  Age: 72 y.o. MRN#: [id number] Attending Physician: Marolyn Haller, MD Primary Care Physician: Kerri Perches, MD Admit Date: 02/17/2023  Reason for Consultation/Follow-up: Establishing goals of care  Subjective: I have reviewed medical records including EPIC notes, MAR, and labs. Received report from primary RN - no acute concerns. RN reports patient ate about 25% of breakfast.  Went to visit patient at bedside - no family/visitors present. Patient was lying in bed awake, alert, disoriented, unable to participate in meaningful conversation. Patient remains mildly restless and is moaning. Telesitter remains at bedside. No respiratory distress, increased work of breathing, or secretions noted.   12:42 PM Called patient's son/Corey - emotional support provided. Therapeutic listening provided as he feels "depressed" with the difficult decisions he's faced. He confirms he was able to speak with patient's niece regarding situation as well as code status/DNR; however, he was not able to speak with his son. He wishes to try to speak with his son again today prior to making final decisions. Denyse Amass tells me "I will have a decision either way tomorrow" and "I know DNR is best but want to make sure my son can give his input." Denyse Amass supports code status change to DNR/DNI. He confirms again that he would not want patient to suffer.  Denyse Amass also confirms again goal for LTC placement with hospice. He has questions regarding cancelling her bed at [facility] - will reach out to [facility] to inform them he has questions and requests assistance from them if able. Provided updates that, per their notes, TOC has already begun LTC  referrals and we are waiting for bed offers at this time. Denyse Amass expresses appreciation for assistance navigating next steps.  Denyse Amass wishes to bring patient's favorite food to her this evening - aspiration precautions reviewed - he may bring for comfort feeds.  All questions and concerns addressed. Encouraged to call with questions and/or concerns. PMT card previously provided.  Length of Stay: 5  Current Medications: Scheduled Meds:    stroke: early stages of recovery book   Does not apply Once   amLODipine  5 mg Oral Daily   busPIRone  5 mg Oral TID   carvedilol  12.5 mg Oral BID WC  cycloSPORINE  1 drop Both Eyes Daily   heparin  5,000 Units Subcutaneous Q8H   lamoTRIgine  150 mg Oral BID   [START ON 02/25/2023] levothyroxine  25 mcg Oral Q Sun   And   levothyroxine  50 mcg Oral Once per day on Monday Tuesday Wednesday Thursday Friday Saturday   mouth rinse  15 mL Mouth Rinse 4 times per day   sertraline  100 mg Oral q morning    Continuous Infusions:  levETIRAcetam 500 mg (02/22/23 0934)    PRN Meds: acetaminophen **OR** acetaminophen (TYLENOL) oral liquid 160 mg/5 mL **OR** acetaminophen, albuterol, alum & mag hydroxide-simeth, guaiFENesin-dextromethorphan, hydrALAZINE, LORazepam, mouth rinse, senna-docusate, sodium chloride  Physical Exam Vitals and nursing note reviewed.  Constitutional:      General: She is not in acute distress. Pulmonary:     Effort: No respiratory distress.  Skin:    General: Skin is warm and dry.  Neurological:     Mental Status: She is alert. Mental status is at baseline. She is disoriented and confused.     Motor: Weakness present.  Psychiatric:        Behavior: Behavior is cooperative.        Cognition and Memory: Cognition is impaired. Memory is impaired.        Judgment: Judgment is impulsive.             Vital Signs: BP (!) 133/57 (BP Location: Left Arm)   Pulse 63   Temp 98 F (36.7 C) (Oral)   Resp 18   Ht 4\' 11"  (1.499 m)    Wt 70 kg   SpO2 97%   BMI 31.17 kg/m  SpO2: SpO2: 97 % O2 Device: O2 Device: Room Air O2 Flow Rate:    Intake/output summary:  Intake/Output Summary (Last 24 hours) at 02/22/2023 1231 Last data filed at 02/22/2023 1017 Gross per 24 hour  Intake 970.46 ml  Output 800 ml  Net 170.46 ml   LBM: Last BM Date : 02/20/23 Baseline Weight: Weight: 70 kg Most recent weight: Weight: 70 kg       Palliative Assessment/Data: PPS 30%      Patient Active Problem List   Diagnosis Date Noted   AMS (altered mental status) 02/17/2023   Acute metabolic encephalopathy 01/12/2023   History of CVA (cerebrovascular accident) 01/12/2023   Type 2 diabetes mellitus with diabetic chronic kidney disease (HCC) 12/14/2022   Lumbar radiculopathy 12/14/2022   Ataxia 10/25/2022   Lesion of skin of scalp 10/25/2022   Seborrheic dermatitis of scalp 10/15/2022   Dermatophytosis of nail 10/09/2022   Callus of foot 10/09/2022   Neuroma of foot 10/09/2022   History of shoulder surgery 10/04/2022   H/O total shoulder replacement, left 08/18/2022   Scalp cyst 08/10/2022   Right-sided chest pain 08/06/2022   CKD (chronic kidney disease) stage 3, GFR 30-59 ml/min (HCC) 07/03/2022   Skin tag 07/03/2022   Itchy scalp 07/03/2022   Left-sided chest pain 04/27/2022   Bilateral leg edema 12/21/2021   Hypoglycemia 12/20/2021   Skin lesions, generalized 12/19/2021   Insomnia due to stress 12/19/2021   Major depressive disorder 12/11/2021   Rotator cuff tear arthropathy 11/15/2021   Anemia in chronic kidney disease (CKD) 10/12/2021   Dehydration 09/25/2021   CHF (congestive heart failure) (HCC) 09/16/2021   Urinary frequency 09/01/2021   Anemia of chronic disease 08/10/2021   Generalized pain 08/04/2021   Sciatica, right side 07/19/2021   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) 07/10/2021   Atherosclerosis of aorta (HCC) 07/10/2021   Generalized  weakness 05/12/2021   Trigger finger, right 12/12/2020   Confusion 11/24/2020   Blurry vision 11/18/2020   Cervical radiculopathy 07/09/2020   Headache 02/19/2020   Tubular adenoma of colon 02/09/2019   Benign neoplasm of cerebral meninges (HCC) 11/12/2018   Benign meningioma of brain (HCC) 10/31/2018   Osteoarthritis of both hips 07/06/2018   Left arm pain 06/11/2018   Lipoma of extremity 12/31/2017   Impingement syndrome of left shoulder region 12/27/2017   Leg weakness, bilateral 10/28/2017   Lumbar spondylosis with myelopathy 10/12/2017   Numbness of hand 10/10/2017   Chronic neck pain 07/25/2017   Chronic pain syndrome 07/25/2017   Diabetic polyneuropathy associated with type 2 diabetes mellitus (HCC) 05/19/2016   Tobacco use disorder 04/24/2016   Obesity (BMI 30.0-34.9) 04/24/2016   History of palpitations 08/09/2015   Nausea 08/09/2015   Labile hypertension 08/03/2015   Normal coronary arteries 08/03/2015   Dizziness 07/15/2015   Left-sided low back pain with left-sided sciatica 06/27/2015   Type 2 diabetes mellitus with hyperglycemia, with long-term current use of insulin (HCC) 05/06/2015   Multinodular goiter 05/06/2015   Rectocele, female 04/27/2015   Anal sphincter incontinence 04/27/2015   Pelvic relaxation due to rectocele 03/30/2015   Hyperkalemia 02/22/2015   Pulmonary hypertension (HCC) 02/22/2015   Posterior chest pain 02/21/2015   Episodic cigarette smoking dependence 01/11/2015   Migraine without aura and without status migrainosus, not intractable 07/02/2014   Microcytic anemia 02/18/2014   Hip pain, right 12/17/2013   COPD mixed type (HCC) 09/16/2013   Chronic obstructive pulmonary disease (HCC) 09/16/2013   Acquired hypothyroidism 08/16/2013   Gastroparesis 04/28/2013   Nicotine dependence 03/09/2013   Seizure disorder (HCC) 01/19/2013   Displacement of cervical intervertebral disc without myelopathy 12/13/2012   Bursitis of shoulder 12/13/2012    Cervical disc disorder with radiculopathy of cervical region 10/31/2012   Solitary pulmonary nodule 08/19/2012   Hypersomnia disorder related to a known organic factor 06/11/2012   Pruritus 04/18/2012   Meningioma (HCC) 11/19/2011   Mononeuritis leg 10/25/2011   Carpal tunnel syndrome of right wrist 05/23/2011   Chronic pain of right hand 05/04/2011   Polypharmacy 04/28/2011   Mood disorder (HCC) 04/28/2011   Constipation 04/13/2011   Recurrent falls 12/12/2010   Oropharyngeal dysphagia 07/12/2010   Urinary incontinence 12/16/2009   HEARING LOSS 10/26/2009   DMII (diabetes mellitus, type 2) (HCC) 07/07/2009   Mixed hyperlipidemia 12/11/2008   IBS 12/11/2008   GERD 07/29/2008   MILK PRODUCTS ALLERGY 07/29/2008   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 female  with past medical history of DM2, HTN, HLD, chronic smoking, OSA on CPAP, stroke, COPD hypothyroidism, seizure, bipolar disorder, fibromyalgia, arthritis, diverticulosis, GERD, and chronic anemia  was admitted on 02/17/2023 with acute metabolic encephalopathy/concern for seizures versus CVA.   Of note, patient has had 3 admissions and 4 ED visits in the last 6 months.  She is a 30-day readmission.  Most recent hospitalization was due to encephalopathy, AKI, and seizure disorders - she was discharged to SNF rehab.  Assessment: Principal Problem:   AMS (altered mental status) Active Problems:   Seizure disorder (HCC)  Acquired hypothyroidism   Type 2 diabetes mellitus with hyperglycemia, with long-term current use of insulin (HCC)   Diabetic polyneuropathy associated with type 2 diabetes mellitus (HCC)   Benign meningioma of brain (HCC)   CHF  (congestive heart failure) (HCC)   Anemia in chronic kidney disease (CKD)   CKD (chronic kidney disease) stage 3, GFR 30-59 ml/min (HCC)   Chronic obstructive pulmonary disease (HCC)   Acute metabolic encephalopathy   Concern about end of life  SUMMARY OF RECOMMENDATIONS   Continue current gentle treatments without escalation of care, main focus on comfort Continue full code for now - son agreeable with DNR/DNI but wants to speak with one more family member prior to final decisions. He states he will have final decision tomorrow 12/13 If/when DNR/DNI is confirmed, can transition patient to full comfort measures in house Goal is for patient's discharge to LTC with hospice  Continue Dys 1 diet but patient may eat/drink as desires for comfort focused care PMT will continue to follow and support holistically   Symptom Management Continue ativan 1mg  q6h PRN anxiety/restlessness   Goals of Care and Additional Recommendations: Limitations on Scope of Treatment: Minimize Medications, Full Scope Treatment, No Artificial Feeding, and No Tracheostomy  Code Status:    Code Status Orders  (From admission, onward)           Start     Ordered   02/17/23 2048  Full code  Continuous       Question:  By:  Answer:  Procedural case: previous code status reviewed   02/17/23 2048           Code Status History     Date Active Date Inactive Code Status Order ID Comments User Context   01/12/2023 2221 01/27/2023 1559 Full Code [id number]  Frankey Shown, DO Inpatient   08/18/2022 1603 08/25/2022 2240 Full Code [id number]  Beverely Low, MD Inpatient   10/03/2021 1948 10/05/2021 2123 Full Code [id number]  Zierle-Ghosh, Asia B, DO ED   09/23/2021 0613 09/24/2021 1930 Full Code [id number]  Frankey Shown, DO Inpatient   09/16/2021 0602 09/17/2021 2001 Full Code [id number]  Zierle-Ghosh, Asia B, DO Inpatient   12/04/2018 1708 12/08/2018 1653 Full Code [id number]  Shon Hale, MD Inpatient   10/28/2017 1147  10/30/2017 1548 Full Code [id number]  Catarina Hartshorn, MD Inpatient   10/15/2016 2010 10/20/2016 1906 Full Code [id number]  Haydee Monica, MD Inpatient   09/10/2016 1213 09/18/2016 1546 Full Code [id number]  Erick Blinks, MD Inpatient   02/21/2015 1713 02/22/2015 2028 Full Code [id number]  Elliot Cousin, MD ED   06/02/2014 0411 06/07/2014 1715 Full Code [id number]  Floydene Flock, MD ED   02/15/2012 1352 02/17/2012 1348 Full Code [id number]  Wilson Singer, MD Inpatient   11/23/2011 1625 12/01/2011 1929 Full Code [id number]  Floreen Comber, RN Inpatient   08/02/2011 0520 08/03/2011 2015 Full Code [id number]  Etheleen Mayhew June, RN Inpatient   04/27/2011 1640 04/28/2011 1815 Full Code [id number]  Toler, Pearla Dubonnet, RN Inpatient       Prognosis:  < 6 months  Discharge Planning: Skilled Nursing Facility with Hospice  Care plan was discussed with primary RN, patient's son, TOC, Dr. Montez Morita  Thank you for allowing the Palliative Medicine Team to assist in the care of this patient.   Total Time 50 minutes Prolonged Time Billed  no       Haskel Khan, NP  Please contact Palliative Medicine Team phone at [phone] for questions and  concerns.   *Portions of this note are a verbal dictation therefore any spelling and/or grammatical errors are due to the "Dragon [employer] One" system interpretation.

## 2023-02-22 NOTE — Plan of Care (Signed)
  Problem: Coping: Goal: Will identify appropriate support needs Outcome: Progressing   Problem: Clinical Measurements: Goal: Respiratory complications will improve Outcome: Progressing Goal: Cardiovascular complication will be avoided Outcome: Progressing   Problem: Coping: Goal: Level of anxiety will decrease Outcome: Progressing   Problem: Elimination: Goal: Will not experience complications related to urinary retention Outcome: Progressing   Problem: Skin Integrity: Goal: Risk for impaired skin integrity will decrease Outcome: Progressing

## 2023-02-23 DIAGNOSIS — Z515 Encounter for palliative care: Secondary | ICD-10-CM

## 2023-02-23 DIAGNOSIS — R4182 Altered mental status, unspecified: Secondary | ICD-10-CM | POA: Diagnosis not present

## 2023-02-23 DIAGNOSIS — Z7189 Other specified counseling: Secondary | ICD-10-CM

## 2023-02-23 LAB — BASIC METABOLIC PANEL WITH GFR
Anion gap: 9 (ref 5–15)
BUN: 9 mg/dL (ref 8–23)
CO2: 27 mmol/L (ref 22–32)
Calcium: 9.3 mg/dL (ref 8.9–10.3)
Chloride: 105 mmol/L (ref 98–111)
Creatinine, Ser: 0.85 mg/dL (ref 0.44–1.00)
GFR, Estimated: >60 mL/min
Glucose, Bld: 122 mg/dL — ABNORMAL HIGH (ref 70–99)
Potassium: 3.3 mmol/L — ABNORMAL LOW (ref 3.5–5.1)
Sodium: 141 mmol/L (ref 135–145)

## 2023-02-23 MED ORDER — ORAL CARE MOUTH RINSE
15.0000 mL | OROMUCOSAL | Status: DC
  Administered 2023-02-23 – 2023-02-27 (×11): 15 mL via OROMUCOSAL

## 2023-02-23 MED ORDER — GLYCOPYRROLATE 0.2 MG/ML IJ SOLN: 0.2000 mg | INTRAMUSCULAR | Status: DC | PRN

## 2023-02-23 MED ORDER — POTASSIUM CHLORIDE CRYS ER 20 MEQ PO TBCR: 20.0000 meq | EXTENDED_RELEASE_TABLET | Freq: Once | ORAL | Status: DC

## 2023-02-23 MED ORDER — MORPHINE SULFATE (PF) 2 MG/ML IV SOLN
1.0000 mg | INTRAVENOUS | Status: DC | PRN
  Administered 2023-02-25: 2 mg via INTRAVENOUS
  Filled 2023-02-23: qty 1

## 2023-02-23 MED ORDER — LORAZEPAM 2 MG/ML IJ SOLN
1.0000 mg | INTRAMUSCULAR | Status: DC | PRN
  Administered 2023-02-24 – 2023-02-25 (×5): 1 mg via INTRAVENOUS
  Filled 2023-02-23 (×5): qty 1

## 2023-02-23 MED ORDER — GLYCOPYRROLATE 1 MG PO TABS: 1.0000 mg | ORAL_TABLET | ORAL | Status: DC | PRN

## 2023-02-23 MED ORDER — POLYVINYL ALCOHOL 1.4 % OP SOLN: 1.0000 [drp] | Freq: Four times a day (QID) | OPHTHALMIC | Status: DC | PRN

## 2023-02-23 MED ORDER — POTASSIUM CHLORIDE 10 MEQ/100ML IV SOLN
10.0000 meq | INTRAVENOUS | Status: DC
  Administered 2023-02-23 (×2): 10 meq via INTRAVENOUS
  Filled 2023-02-23 (×2): qty 100

## 2023-02-23 MED ORDER — ONDANSETRON HCL 4 MG/2ML IJ SOLN: 4.0000 mg | Freq: Four times a day (QID) | INTRAMUSCULAR | Status: DC | PRN

## 2023-02-23 MED ORDER — ONDANSETRON 4 MG PO TBDP: 4.0000 mg | ORAL_TABLET | Freq: Four times a day (QID) | ORAL | Status: DC | PRN

## 2023-02-23 MED ORDER — ORAL CARE MOUTH RINSE: 15.0000 mL | OROMUCOSAL | Status: DC | PRN

## 2023-02-23 NOTE — Plan of Care (Signed)
  Problem: Education: Goal: Knowledge of disease or condition will improve Outcome: Not Progressing Goal: Knowledge of secondary prevention will improve (MUST DOCUMENT ALL) Outcome: Not Progressing Goal: Knowledge of patient specific risk factors will improve (Mark N/A or DELETE if not current risk factor) Outcome: Not Progressing   Problem: Ischemic Stroke/TIA Tissue Perfusion: Goal: Complications of ischemic stroke/TIA will be minimized Outcome: Not Progressing   

## 2023-02-23 NOTE — Hospital Course (Addendum)
72 y.o. female with past medical history significant for HTN, HLD, DM2, history of CVA, COPD, hypothyroidism, seizure disorder, history of meningioma s/p resection, bipolar disorder, fibromyalgia, osteoarthritis, diverticulosis, GERD, chronic anemia, OSA on CPAP with recent hospitalization due to encephalopathy, acute renal failure and seizure subsequent discharge to SNF who presented to [facility] ED on 12/7 via EMS for altered mental status.  Patient was obtunded, encephalopathic and history obtained from records, consultants and staff. On arrival code stroke was initiated.  Apparently last known well approximately 4 PM this afternoon.  At baseline usually oriented x 2, ambulatory with a walker/cane.  Patient was transported the ED for further evaluation and management per EMS.  In the ED, temperature 98.4 F, HR 80, RR 22, BP 146/68, SpO2 94% on room air.  WBC 7.6, hemoglobin 10.3, platelet count 251.  Sodium 142, potassium 3.4, chloride 106, CO2 27, BUN 16, creat 1.30, glucose 108.  AST 19, ALT 14, total bilirubin 0.5.  INR 1.1.  EtOH level less than 10.  CT head >>no hemorrhage,postsurgical encephalomalacia in b/l medial frontal lobes CT angiogram head/neck >> no emergent LVO, moderate stenosis left V4 segment. Neurology was consulted.  She was deemed high risk for TNK and received Ativan for concern of seizures.  She was loaded with Keppra. TRH consulted for admission and patient was transferred ED to ED to [facility] for LTM EEG and neurology evaluation.  S/P extensive workup with CT head CT angio, EEG continuous, due to concern for nonconvulsive seizure.patient is continued on Keppra,lamotrigine and if continues to have episodes of unresponsiveness and altered mental status could consider increasing Keppra in the future.At baseline she is confused does become combative at times. Neurology signed off Seen by palliative care for GOC-multiple meetings were held and discussion held  regarding goals of care, with her encephalopathy and agitation and poor oral intake after further discussion family decided to transition to comfort care on 12 /13/24 [facility] has accepted patient and after discussion with the son being discharged to [facility] 12/17.

## 2023-02-23 NOTE — Progress Notes (Signed)
Palliative Medicine Inpatient Follow Up Note HPI: 72 y.o. female with past medical history of DM2, HTN, HLD, chronic smoking, OSA on CPAP, stroke, COPD hypothyroidism, seizure, bipolar disorder, fibromyalgia, arthritis, diverticulosis, GERD, and chronic anemia was admitted on 02/17/2023 with acute metabolic encephalopathy/concern for seizures versus CVA.   Today's Discussion 02/23/2023  *Please note that this is a verbal dictation therefore any spelling or grammatical errors are due to the "Dragon Medical One" system interpretation.  Chart reviewed inclusive of vital signs, progress notes, laboratory results, and diagnostic images.   I met with Nakirah this morning. She was able to groan though otherwise was not responsive.  Per patients RN, she is no longer taking orals.  I called patients son, Denyse Amass. Created space and opportunity for him to explore thoughts feelings and fears regarding his mothers current medical situation. We discussed the options from this point forward. He above all else does not desire for his mother to suffer.   We talked about transition to comfort measures in house and what that would entail inclusive of medications to control pain, dyspnea, agitation, nausea, itching, and hiccups.  We discussed stopping all uneccessary measures such as cardiac monitoring, blood draws, needle sticks, and frequent vital signs.  Utilized reflective listening throughout our time together.   We discussed that given patients significant decline optimizing her symptom needs should be the priority.  Reviewed that patients grandson does not want her to be a DNAR/DNI though patients son realizes that we could cause her body harm and damage if we pursue aggressive measures. He shares understanding of this and that he would not want this for his mother. Confirmed the plan for DNAR/DNI which he agreed to.   Patients son is agreeable with hospice home assessment versus long term facility with  hospice.   Questions and concerns addressed/Palliative Support Provided.   Objective Assessment: Vital Signs Vitals:   02/23/23 0700 02/23/23 0845  BP: (!) 150/61 (!) 150/61  Pulse: 74 74  Resp: 17   Temp: 98.3 F (36.8 C) 98.3 F (36.8 C)  SpO2: 100% 100%    Intake/Output Summary (Last 24 hours) at 02/23/2023 1229 Last data filed at 02/23/2023 0700 Gross per 24 hour  Intake 350 ml  Output 200 ml  Net 150 ml   Last Weight  Most recent update: 02/17/2023  6:37 PM    Weight  70 kg (154 lb 5.2 oz)            Gen:  Chronically ill appearing AA F HEENT: moist mucous membranes CV: Regular rate and rhythm  PULM: ON RA, breathing is even and not labored ABD: soft/nontender  EXT: (-) edema  Neuro: Groans, does not follow directions  SUMMARY OF RECOMMENDATIONS   DNAR/DNI  Comfort measures  Medication per MAR  Appreciate TOC working on placement options --> No longer eating so may be considered for IP Hospice  Unrestricted visitation  Ongoing PMT support  Time Spent: 65 Billing based on MDM: High  Problems Addressed: One acute or chronic illness or injury that poses a threat to life or bodily function  Amount and/or Complexity of Data: Category 3:Discussion of management or test interpretation with external physician/other qualified health care professional/appropriate source (not separately reported)  Risks: Parenteral controlled substances and Decision not to resuscitate or to de-escalate care because of poor prognosis ______________________________________________________________________________________ Lamarr Lulas [place] Palliative Medicine Team Team Cell Phone: [phone] Please utilize secure chat with additional questions, if there is no response within 30 minutes please  call the above phone number  Palliative Medicine Team providers are available by phone from 7am to 7pm daily and can be reached through the team cell phone.  Should this  patient require assistance outside of these hours, please call the patient's attending physician.

## 2023-02-23 NOTE — TOC Progression Note (Signed)
Transition of Care [facility]) - Progression Note    Patient Details  Name: [name] MRN: [id number] Date of Birth: August 13, 1950  Transition of Care [facility]) CM/SW Contact  Baldemar Lenis, [place] Phone Number: 02/23/2023, 3:32 PM  Clinical Narrative:   CSW received call from patient's niece, Jonathon Jordan, to discuss disposition options now that patient is comfort care. CSW discussed that there are no SNF offers at this time, asked if family would be agreeable to residential hospice if the patient qualifies. Family would be in agreement, preference for [facility], either [facility] as family is in both locations. CSW contacted [facility] to provide referral. CSW to follow.    Expected Discharge Plan: Skilled Nursing Facility Barriers to Discharge: Continued Medical Work up  Expected Discharge Plan and Services     Post Acute Care Choice: Skilled Nursing Facility Living arrangements for the past 2 months: Single Family Home                                       Social Determinants of Health (SDOH) Interventions SDOH Screenings   Food Insecurity: No Food Insecurity (02/18/2023)  Housing: Patient Declined (02/18/2023)  Transportation Needs: No Transportation Needs (02/18/2023)  Utilities: Not At Risk (02/18/2023)  Alcohol Screen: Low Risk  (07/17/2022)  Depression (PHQ2-9): Low Risk  (12/12/2022)  Financial Resource Strain: Low Risk  (07/17/2022)  Physical Activity: Insufficiently Active (07/17/2022)  Social Connections: Moderately Integrated (07/17/2022)  Stress: No Stress Concern Present (07/17/2022)  Tobacco Use: High Risk (02/18/2023)  Health Literacy: Adequate Health Literacy (12/14/2022)    Readmission Risk Interventions    08/21/2022   11:54 AM 09/16/2021   10:07 AM  Readmission Risk Prevention Plan  Transportation Screening Complete Complete  Medication Review (RN Care Manager) Complete Complete  PCP or Specialist appointment within 3-5 days of discharge  Complete   HRI or Home Care Consult Complete Complete  SW Recovery Care/Counseling Consult Complete Complete  Palliative Care Screening Not Applicable Not Applicable  Skilled Nursing Facility Not Applicable Patient Refused

## 2023-02-23 NOTE — Progress Notes (Signed)
PROGRESS NOTE [name]  VFI:[id number] DOB: [birth year] DOA: 02/17/2023 PCP: Kerri Perches, MD  Brief Narrative/Hospital Course: 72 y.o. female with past medical history significant for HTN, HLD, DM2, history of CVA, COPD, hypothyroidism, seizure disorder, history of meningioma s/p resection, bipolar disorder, fibromyalgia, osteoarthritis, diverticulosis, GERD, chronic anemia, OSA on CPAP with recent hospitalization due to encephalopathy, acute renal failure and seizure subsequent discharge to SNF who presented to [facility] ED on 12/7 via EMS for altered mental status.  Patient was currently obtunded, encephalopathic and history obtained from records, consultants and staff.  On arrival code stroke was initiated.  Apparently last known well approximately 4 PM this afternoon.  At baseline usually oriented x 2, ambulatory with a walker/cane.  Patient was transported the ED for further evaluation and management per EMS.   In the ED, temperature 98.4 F, HR 80, RR 22, BP 146/68, SpO2 94% on room air.  WBC 7.6, hemoglobin 10.3, platelet count 251.  Sodium 142, potassium 3.4, chloride 106, CO2 27, BUN 16, creat 1.30, glucose 108.  AST 19, ALT 14, total bilirubin 0.5.  INR 1.1.  EtOH level less than 10.  CT head without contrast with no hemorrhage or CT evidence of acute cortical infarct, postsurgical encephalomalacia along the medial frontal lobes bilaterally.  CT angiogram head/neck with no emergent large vessel occlusion, moderate stenosis left V4 segment.  Neurology was consulted.  She was deemed high risk for TNK and received Ativan for concern of seizures.  She was loaded with Keppra.  Received aspirin per rectum.  TRH consulted for admission and patient was transferred ED to ED to [facility] for LTM EEG and neurology evaluation.  Seen by neurology underwent extensive workup with CT head CT angio, EEG-neuro advised to continue Keppra 5 mg twice daily lamotrigine and if continues to  have episodes of unresponsiveness and altered mental status could consider increasing Keppra in the future.  At baseline she is confused does become combative at times. Seen by palliative care for GOC    Subjective: Patient seen and examined this morning eyes open mildly sleepy does not follow commands mumble words Mitt[place] in place Overnight afebrile BP 150s 160s Reviewed labs from 12/13 with mild hypokalemia blood sugar 122.  Function stable  Assessment and Plan: Principal Problem:   AMS (altered mental status) Active Problems:   Seizure (HCC)   Acquired hypothyroidism   Type 2 diabetes mellitus with hyperglycemia, with long-term current use of insulin (HCC)   Diabetic polyneuropathy associated with type 2 diabetes mellitus (HCC)   Benign meningioma of brain (HCC)   CHF (congestive heart failure) (HCC)   Anemia in chronic kidney disease (CKD)   CKD (chronic kidney disease) stage 3, GFR 30-59 ml/min (HCC)   Chronic obstructive pulmonary disease (HCC)   Acute metabolic encephalopathy   Acute metabolic encephalopathy Non-convulsive seizure Concern for acute CVA-BUT RULED OUT Baseline confusion: Presenting from SNF with mental status changes agitation, seen by neurology underwent extensive workup with CT head, CT angio head and neck unrevealing, concern for breakthrough seizures, given Ativan and loaded with Keppra EEG no seizure, neurology advised to continue Keppra Lamictal- if continues to have episodes of unresponsiveness and altered mental status could consider increasing Keppra in the future.  Continue seizure precaution fall precaution supportive care   UTI: Patient completed antibiotics 12/11.NO URINE CX   Hypokalemia Replace again    Essential hypertension BP well-controlled on amlodipine 5 mg, Coreg 12.5 mg    Hyperlipidemia Holding home  meds   DM2 Pta ON Semglee 10 units [place] daily.  Hemoglobin C 5.8 01/13/2023.  Blood sugar control on SSI holding his insulin LA    COPD Not in exacerbation on room air    Hypothyroidism Continue levothyroxine    Chronic anemia Hemoglobin baseline and stable    Anxiety/depression with baseline confusion continue Zoloft and BuSpar holding Ativan   Weakness/debility/deconditioning/gait disturbance Adult failure to thrive: Per niece, patient has been slowly declining since rotator cuff surgery earlier this year.  Was initially at acute rehab and recently transition to long-term care given lack of progress.  Despite aggressive attempts, patient continues to further decline.  Discussed with niece at bedside that given her continued decline/demise, advanced age, chronic comorbidities it would be recommended to transition to a more comfort approach with hospice.  She would like to discuss further with other family members. Seen by the daycare discussion about CODE STATUS and goals of care with possible transition to LTC with hospice Waiting for family to make decision  Class I Obesity:Body mass index is 31.17 kg/m.    DVT prophylaxis: heparin injection 5,000 Units Start: 02/17/23 2200 Code Status:   Code Status: Full Code Family Communication: plan of care discussed with patient/none at bedside. Patient status is: Remains hospitalized because of severity of illness Level of care: Telemetry Medical   Dispo: The patient is from: home            Anticipated disposition: TBD return to facility with hospice pending further family decision Objective: Vitals last 24 hrs: Vitals:   02/22/23 2024 02/23/23 0315 02/23/23 0700 02/23/23 0845  BP: (!) 155/77 (!) 160/68 (!) 150/61 (!) 150/61  Pulse: 79 88 74 74  Resp: 18 18 17    Temp: 98.1 F (36.7 C) 98.2 F (36.8 C) 98.3 F (36.8 C) 98.3 F (36.8 C)  TempSrc: Oral Oral  Oral  SpO2: 98% 97% 100% 100%  Weight:      Height:       Weight change:   Physical Examination: General exam: alert awake, confused not following commands  HEENT:Oral mucosa moist, Ear/Nose WNL  grossly Respiratory system: Bilaterally clear BS,no use of accessory muscle Cardiovascular system: S1 & S2 +, No JVD. Gastrointestinal system: Abdomen soft,NT,ND, BS+ Nervous System: Alert, awake, confused Extremities: LE edema neg,distal peripheral pulses palpable and warm.  Skin: No rashes,no icterus. MSK: Normal muscle bulk,tone, power   Medications reviewed:  Scheduled Meds:   stroke: early stages of recovery book   Does not apply Once   amLODipine  5 mg Oral Daily   busPIRone  5 mg Oral TID   carvedilol  12.5 mg Oral BID WC   cycloSPORINE  1 drop Both Eyes Daily   heparin  5,000 Units Subcutaneous Q8H   lamoTRIgine  150 mg Oral BID   [START ON 02/25/2023] levothyroxine  25 mcg Oral Q Sun   And   levothyroxine  50 mcg Oral Once per day on Monday Tuesday Wednesday Thursday Friday Saturday   mouth rinse  15 mL Mouth Rinse 4 times per day   sertraline  100 mg Oral q morning   Continuous Infusions:  levETIRAcetam 500 mg (02/22/23 2057)      Diet Order             DIET - DYS 1 Fluid consistency: Thin  Diet effective now                  Intake/Output Summary (Last 24 hours) at 02/23/2023 [phone]  Last data filed at 02/22/2023 1815 Gross per 24 hour  Intake 50 ml  Output 600 ml  Net -550 ml   Net IO Since Admission: 474.24 mL [02/23/23 0905]  Wt Readings from Last 3 Encounters:  02/17/23 70 kg  02/11/23 70 kg  01/12/23 70.4 kg     Unresulted Labs (From admission, onward)     Start     Ordered   02/19/23 0724  Urine Culture (for pregnant, neutropenic or urologic patients or patients with an indwelling urinary catheter)  (Urine Labs)  Add-on,   AD       Question:  Indication  Answer:  Altered mental status (if no other cause identified)   02/19/23 0723           Data Reviewed: I have personally reviewed following labs and imaging studies CBC: Recent Labs  Lab 02/17/23 1837 02/18/23 1553 02/19/23 0627 02/20/23 0456 02/21/23 0559 02/22/23 0709  WBC  7.6 9.3 10.7* 10.0 7.6 6.1  NEUTROABS 4.6  --   --   --   --   --   HGB 10.3* 10.0* 9.3* 9.0* 9.3* 9.7*  HCT 37.2 35.2* 32.2* 30.8* 32.3* 33.1*  MCV 78.0* 76.9* 77.0* 75.3* 75.1* 74.7*  PLT 251 226 220 240 262 287   Basic Metabolic Panel:  Recent Labs  Lab 02/19/23 0627 02/20/23 0456 02/21/23 0559 02/22/23 0709 02/23/23 0649  NA 142 139 141 139 141  K 3.1* 3.4* 3.4* 3.5 3.3*  CL 107 107 108 105 105  CO2 21* 23 24 26 27   GLUCOSE 111* 129* 132* 120* 122*  BUN 9 12 10 12 9   CREATININE 0.84 0.79 0.80 0.89 0.85  CALCIUM 8.5* 8.7* 9.1 9.1 9.3  MG 1.8 2.3 1.9 2.0  --   PHOS  --   --  2.6  --   --    GFR: Estimated Creatinine Clearance: 50.9 mL/min (by C-G formula based on SCr of 0.85 mg/dL). Liver Function Tests:  Recent Labs  Lab 02/17/23 1837 02/21/23 0559  AST 19 17  ALT 14 12  ALKPHOS 70 63  BILITOT 0.5 0.7  PROT 7.5 6.6  ALBUMIN 4.2 3.3*   Recent Labs  Lab 02/17/23 1837  INR 1.1   No results for input(s): "PROBNP" in the last 168 hours.  No results for input(s): "HGBA1C" in the last 72 hours. Recent Labs  Lab 02/17/23 1833 02/18/23 0049 02/18/23 0507 02/18/23 0742 02/20/23 0718  GLUCAP 89 93 107* 113* 173*   No results found for this or any previous visit (from the past 240 hours).  Antimicrobials/Microbiology: Anti-infectives (From admission, onward)    Start     Dose/Rate Route Frequency Ordered Stop   02/19/23 1245  ciprofloxacin (CIPRO) IVPB 400 mg        400 mg 200 mL/hr over 60 Minutes Intravenous Every 12 hours 02/19/23 1157 02/21/23 2254   02/19/23 1045  ciprofloxacin (CIPRO) tablet 500 mg  Status:  Discontinued        500 mg Oral 2 times daily 02/19/23 0952 02/19/23 1157   02/19/23 0815  ciprofloxacin (CIPRO) IVPB 400 mg  Status:  Discontinued        400 mg 200 mL/hr over 60 Minutes Intravenous Every 12 hours 02/19/23 0725 02/19/23 0952         Component Value Date/Time   SDES  01/12/2023 2123    URINE, CATHETERIZED Performed at [facility], [address], [place], [place] [address]    SPECREQUEST  01/12/2023 2123    NONE Performed at [facility], [address] [place], [place] [address]    CULT  01/12/2023 2123    NO GROWTH Performed at [facility], [address], [place], [place] [address]    REPTSTATUS 01/14/2023 FINAL 01/12/2023 2123     Radiology Studies: No results found.   LOS: 6 days   Total time spent in review of labs and imaging, patient evaluation, formulation of plan, documentation and communication with family: 35 minutes  Lanae Boast, MD [facility]  02/23/2023, 9:05 AM

## 2023-02-23 NOTE — Progress Notes (Signed)
Speech Language Pathology Treatment:    Patient Details Name: [name] MRN: [id number] DOB: 08/23/1950 Today's Date: 02/23/2023 Time:  -      Pt adamantly refused to eat or drink from this SLP stating "I'm going to call the  police." Seen from Palliative care's note and orders that pt is allowed to have comfort feeds and some may bring food from outside the hospital. Family is also deciding on comfort care. At this point there is not much more Speech tx has to offer. ST will sign off.

## 2023-02-23 NOTE — Plan of Care (Signed)
*  Patient DNR/ DNI with palliative following.  LTAC/ hospice home placement.   Problem: Education: Goal: Knowledge of disease or condition will improve Outcome: Not Progressing Goal: Knowledge of secondary prevention will improve (MUST DOCUMENT ALL) Outcome: Not Progressing Goal: Knowledge of patient specific risk factors will improve [name] N/A or DELETE if not current risk factor) Outcome: Not Progressing   Problem: Ischemic Stroke/TIA Tissue Perfusion: Goal: Complications of ischemic stroke/TIA will be minimized Outcome: Not Progressing   Problem: Coping: Goal: Will verbalize positive feelings about self Outcome: Not Progressing Goal: Will identify appropriate support needs Outcome: Not Progressing   Problem: Health Behavior/Discharge Planning: Goal: Ability to manage health-related needs will improve Outcome: Not Progressing Goal: Goals will be collaboratively established with patient/family Outcome: Not Progressing   Problem: Self-Care: Goal: Ability to participate in self-care as condition permits will improve Outcome: Not Progressing Goal: Verbalization of feelings and concerns over difficulty with self-care will improve Outcome: Not Progressing Goal: Ability to communicate needs accurately will improve Outcome: Not Progressing   Problem: Nutrition: Goal: Risk of aspiration will decrease Outcome: Not Progressing Goal: Dietary intake will improve Outcome: Not Progressing   Problem: Education: Goal: Knowledge of General Education information will improve Description: Including pain rating scale, medication(s)/side effects and non-pharmacologic comfort measures Outcome: Not Progressing   Problem: Health Behavior/Discharge Planning: Goal: Ability to manage health-related needs will improve Outcome: Not Progressing   Problem: Activity: Goal: Risk for activity intolerance will decrease Outcome: Not Progressing   Problem: Nutrition: Goal: Adequate nutrition will be  maintained Outcome: Not Progressing   Problem: Education: Goal: Knowledge of the prescribed therapeutic regimen will improve Outcome: Not Progressing   Problem: Education: Goal: Knowledge of the prescribed therapeutic regimen will improve Outcome: Not Progressing    Problem: Clinical Measurements: Goal: Will remain free from infection Outcome: Progressing Goal: Respiratory complications will improve Outcome: Progressing Goal: Cardiovascular complication will be avoided Outcome: Progressing   Problem: Coping: Goal: Level of anxiety will decrease Outcome: Progressing   Problem: Elimination: Goal: Will not experience complications related to bowel motility Outcome: Progressing Goal: Will not experience complications related to urinary retention Outcome: Progressing   Problem: Pain Management: Goal: General experience of comfort will improve Outcome: Progressing   Problem: Safety: Goal: Ability to remain free from injury will improve Outcome: Progressing   Problem: Skin Integrity: Goal: Risk for impaired skin integrity will decrease Outcome: Progressing   Problem: Clinical Measurements: Goal: Quality of life will improve Outcome: Progressing   Problem: Role Relationship: Goal: Family's ability to cope with current situation will improve Outcome: Progressing Goal: Ability to verbalize concerns, feelings, and thoughts to partner or family member will improve Outcome: Progressing   Problem: Coping: Goal: Ability to identify and develop effective coping behavior will improve Outcome: Progressing   Problem: Clinical Measurements: Goal: Quality of life will improve Outcome: Progressing   Problem: Role Relationship: Goal: Family's ability to cope with current situation will improve Outcome: Progressing Goal: Ability to verbalize concerns, feelings, and thoughts to partner or family member will improve Outcome: Progressing   Problem: Pain Management: Goal:  Satisfaction with pain management regimen will improve Outcome: Progressing

## 2023-02-23 NOTE — TOC Progression Note (Signed)
Transition of Care [facility]) - Progression Note    Patient Details  Name: [name] MRN: [id number] Date of Birth: 1950-08-19  Transition of Care [facility]) CM/SW Contact  Baldemar [place], [place] Phone Number: 02/23/2023, 3:32 PM  Clinical Narrative:   CSW noting that [facility] is considering. CSW spoke with Admissions, patient is awaiting review by their director of nursing. CSW to follow.    Expected Discharge Plan: Skilled Nursing Facility Barriers to Discharge: Continued Medical Work up  Expected Discharge Plan and Services     Post Acute Care Choice: Skilled Nursing Facility Living arrangements for the past 2 months: Single Family Home                                       Social Determinants of Health (SDOH) Interventions SDOH Screenings   Food Insecurity: No Food Insecurity (02/18/2023)  Housing: Patient Declined (02/18/2023)  Transportation Needs: No Transportation Needs (02/18/2023)  Utilities: Not At Risk (02/18/2023)  Alcohol Screen: Low Risk  (07/17/2022)  Depression (PHQ2-9): Low Risk  (12/12/2022)  Financial Resource Strain: Low Risk  (07/17/2022)  Physical Activity: Insufficiently Active (07/17/2022)  Social Connections: Moderately Integrated (07/17/2022)  Stress: No Stress Concern Present (07/17/2022)  Tobacco Use: High Risk (02/18/2023)  Health Literacy: Adequate Health Literacy (12/14/2022)    Readmission Risk Interventions    08/21/2022   11:54 AM 09/16/2021   10:07 AM  Readmission Risk Prevention Plan  Transportation Screening Complete Complete  Medication Review (RN Care Manager) Complete Complete  PCP or Specialist appointment within 3-5 days of discharge Complete   HRI or Home Care Consult Complete Complete  SW Recovery Care/Counseling Consult Complete Complete  Palliative Care Screening Not Applicable Not Applicable  Skilled Nursing Facility Not Applicable Patient Refused

## 2023-02-24 DIAGNOSIS — Z515 Encounter for palliative care: Secondary | ICD-10-CM | POA: Diagnosis not present

## 2023-02-24 DIAGNOSIS — R4182 Altered mental status, unspecified: Secondary | ICD-10-CM | POA: Diagnosis not present

## 2023-02-24 DIAGNOSIS — Z7189 Other specified counseling: Secondary | ICD-10-CM | POA: Diagnosis not present

## 2023-02-24 NOTE — Plan of Care (Signed)
Has been anxious and agitated throughout the day. Was not easily redirected, Telesitter had to intervene several times, finally gave her some Ativan. Started to calm down.  Problem: Education: Goal: Knowledge of disease or condition will improve Outcome: Progressing   Problem: Nutrition: Goal: Dietary intake will improve Outcome: Progressing   Problem: Coping: Goal: Level of anxiety will decrease Outcome: Progressing

## 2023-02-24 NOTE — Plan of Care (Signed)
  Problem: Education: Goal: Knowledge of disease or condition will improve Outcome: Progressing Goal: Knowledge of secondary prevention will improve (MUST DOCUMENT ALL) Outcome: Progressing Goal: Knowledge of patient specific risk factors will improve [name] N/A or DELETE if not current risk factor) Outcome: Progressing   Problem: Ischemic Stroke/TIA Tissue Perfusion: Goal: Complications of ischemic stroke/TIA will be minimized Outcome: Progressing   Problem: Coping: Goal: Will verbalize positive feelings about self Outcome: Progressing Goal: Will identify appropriate support needs Outcome: Progressing   Problem: Health Behavior/Discharge Planning: Goal: Ability to manage health-related needs will improve Outcome: Progressing Goal: Goals will be collaboratively established with patient/family Outcome: Progressing   Problem: Self-Care: Goal: Ability to participate in self-care as condition permits will improve Outcome: Progressing Goal: Verbalization of feelings and concerns over difficulty with self-care will improve Outcome: Progressing Goal: Ability to communicate needs accurately will improve Outcome: Progressing   Problem: Nutrition: Goal: Risk of aspiration will decrease Outcome: Progressing Goal: Dietary intake will improve Outcome: Progressing   Problem: Education: Goal: Knowledge of General Education information will improve Description: Including pain rating scale, medication(s)/side effects and non-pharmacologic comfort measures Outcome: Progressing   Problem: Health Behavior/Discharge Planning: Goal: Ability to manage health-related needs will improve Outcome: Progressing   Problem: Clinical Measurements: Goal: Ability to maintain clinical measurements within normal limits will improve Outcome: Progressing Goal: Will remain free from infection Outcome: Progressing Goal: Diagnostic test results will improve Outcome: Progressing Goal: Respiratory  complications will improve Outcome: Progressing Goal: Cardiovascular complication will be avoided Outcome: Progressing   Problem: Activity: Goal: Risk for activity intolerance will decrease Outcome: Progressing   Problem: Nutrition: Goal: Adequate nutrition will be maintained Outcome: Progressing   Problem: Coping: Goal: Level of anxiety will decrease Outcome: Progressing   Problem: Elimination: Goal: Will not experience complications related to bowel motility Outcome: Progressing Goal: Will not experience complications related to urinary retention Outcome: Progressing   Problem: Pain Management: Goal: General experience of comfort will improve Outcome: Progressing   Problem: Safety: Goal: Ability to remain free from injury will improve Outcome: Progressing   Problem: Skin Integrity: Goal: Risk for impaired skin integrity will decrease Outcome: Progressing   Problem: Education: Goal: Knowledge of the prescribed therapeutic regimen will improve Outcome: Progressing   Problem: Coping: Goal: Ability to identify and develop effective coping behavior will improve Outcome: Progressing   Problem: Clinical Measurements: Goal: Quality of life will improve Outcome: Progressing   Problem: Respiratory: Goal: Verbalizations of increased ease of respirations will increase Outcome: Progressing   Problem: Role Relationship: Goal: Family's ability to cope with current situation will improve Outcome: Progressing Goal: Ability to verbalize concerns, feelings, and thoughts to partner or family member will improve Outcome: Progressing   Problem: Pain Management: Goal: Satisfaction with pain management regimen will improve Outcome: Progressing   Problem: Education: Goal: Knowledge of the prescribed therapeutic regimen will improve Outcome: Progressing   Problem: Coping: Goal: Ability to identify and develop effective coping behavior will improve Outcome: Progressing    Problem: Clinical Measurements: Goal: Quality of life will improve Outcome: Progressing   Problem: Respiratory: Goal: Verbalizations of increased ease of respirations will increase Outcome: Progressing   Problem: Role Relationship: Goal: Family's ability to cope with current situation will improve Outcome: Progressing Goal: Ability to verbalize concerns, feelings, and thoughts to partner or family member will improve Outcome: Progressing   Problem: Pain Management: Goal: Satisfaction with pain management regimen will improve Outcome: Progressing

## 2023-02-24 NOTE — Progress Notes (Signed)
PROGRESS NOTE [name]  UYQ:[id number] DOB: December 02, 1950 DOA: 02/17/2023 PCP: Kerri Perches, MD  Brief Narrative/Hospital Course: 72 y.o. female with past medical history significant for HTN, HLD, DM2, history of CVA, COPD, hypothyroidism, seizure disorder, history of meningioma s/p resection, bipolar disorder, fibromyalgia, osteoarthritis, diverticulosis, GERD, chronic anemia, OSA on CPAP with recent hospitalization due to encephalopathy, acute renal failure and seizure subsequent discharge to SNF who presented to [facility] ED on 12/7 via EMS for altered mental status.  Patient was currently obtunded, encephalopathic and history obtained from records, consultants and staff.  On arrival code stroke was initiated.  Apparently last known well approximately 4 PM this afternoon.  At baseline usually oriented x 2, ambulatory with a walker/cane.  Patient was transported the ED for further evaluation and management per EMS.   In the ED, temperature 98.4 F, HR 80, RR 22, BP 146/68, SpO2 94% on room air.  WBC 7.6, hemoglobin 10.3, platelet count 251.  Sodium 142, potassium 3.4, chloride 106, CO2 27, BUN 16, creat 1.30, glucose 108.  AST 19, ALT 14, total bilirubin 0.5.  INR 1.1.  EtOH level less than 10.  CT head without contrast with no hemorrhage or CT evidence of acute cortical infarct, postsurgical encephalomalacia along the medial frontal lobes bilaterally.  CT angiogram head/neck with no emergent large vessel occlusion, moderate stenosis left V4 segment.  Neurology was consulted.  She was deemed high risk for TNK and received Ativan for concern of seizures.  She was loaded with Keppra.  Received aspirin per rectum.  TRH consulted for admission and patient was transferred ED to ED to [facility] for LTM EEG and neurology evaluation.  Seen by neurology underwent extensive workup with CT head CT angio, EEG-neuro advised to continue Keppra 5 mg twice daily lamotrigine and if continues to  have episodes of unresponsiveness and altered mental status could consider increasing Keppra in the future.  At baseline she is confused does become combative at times. Seen by palliative care for GOC Ongoing goals of care discussion with palliative care- 02/23/23> patient had been minimally responsive and groaning-no longer taking oral medication And after discussion with patient's son and family member patient transitioned to comfort measures and looking for facility with hospice    Subjective: Seen and examined more alert today, Does not follow commands Mittens in place    Assessment and Plan: Principal Problem:   AMS (altered mental status) Active Problems:   Seizure (HCC)   Acquired hypothyroidism   Type 2 diabetes mellitus with hyperglycemia, with long-term current use of insulin (HCC)   Diabetic polyneuropathy associated with type 2 diabetes mellitus (HCC)   Benign meningioma of brain (HCC)   CHF (congestive heart failure) (HCC)   Anemia in chronic kidney disease (CKD)   CKD (chronic kidney disease) stage 3, GFR 30-59 ml/min (HCC)   Chronic obstructive pulmonary disease (HCC)   Acute metabolic encephalopathy   End-of-life care: Patient has been transitioned to comfort measures 12/13 looking for facility with hospice services  Acute metabolic encephalopathy Non-convulsive seizure Concern for acute CVA-BUT RULED OUT Baseline confusion: Presenting from SNF with mental status changes agitation, seen by neurology underwent extensive workup with CT head, CT angio head and neck unrevealing, concern for breakthrough seizures, given Ativan and loaded with Keppra EEG no seizure, neurology advised to continue Keppra Lamictal Continue on current Keppra Lamictal for comfort   GLO:VFIEPPIRJ antibiotics 12/11   Hypokalemia Essential hypertension Hyperlipidemia DM2-Pta ON Semglee 10 unit COPD Hypothyroidism  Chronic anemia Anxiety/depression    Weakness/debility/deconditioning/gait disturbance Adult failure to thrive: Per niece, patient has been slowly declining since rotator cuff surgery earlier this year.  Was initially at acute rehab and recently transition to long-term care given lack of progress.  Despite aggressive attempts, patient continues to further decline.   Class I Obesity:Body mass index is 31.17 kg/m.    DVT prophylaxis:  Code Status:   Code Status: Do not attempt resuscitation (DNR) - Comfort care Family Communication: plan of care discussed with patient/none at bedside. Patient status is: Remains hospitalized because of severity of illness Level of care: Telemetry Medical   Dispo: The patient is from:SNF            Anticipated disposition: Awaiting for facility with hospice care   Objective: Vitals last 24 hrs: Vitals:   02/23/23 1237 02/23/23 1637 02/23/23 2028 02/24/23 0745  BP: (!) 170/67 (!) 140/62 (!) 149/59 (!) 168/65  Pulse: 79 75 77 81  Resp: 16 16  18   Temp: 97.6 F (36.4 C) (!) 97.5 F (36.4 C) 99 F (37.2 C) 98.3 F (36.8 C)  TempSrc: Oral Oral Oral Oral  SpO2: 100% 98% 100% 100%  Weight:      Height:       Weight change:   Physical Examination: General exam: alert agitated HEENT:Oral mucosa moist, Ear/Nose WNL grossly Respiratory system: Bilaterally clear BS,no use of accessory muscle Cardiovascular system: S1 & S2 +, No JVD. Gastrointestinal system: Abdomen soft,NT,ND, BS+ Nervous System: Alert, does not follow commands Extremities: LE edema neg,distal peripheral pulses palpable and warm.  Skin: No rashes,no icterus. MSK: Normal muscle bulk,tone, power   Medications reviewed:  Scheduled Meds:  cycloSPORINE  1 drop Both Eyes Daily   lamoTRIgine  150 mg Oral BID   [START ON 02/25/2023] levothyroxine  25 mcg Oral Q Sun   And   levothyroxine  50 mcg Oral Once per day on Monday Tuesday Wednesday Thursday Friday Saturday   mouth rinse  15 mL Mouth Rinse 4 times per day    sertraline  100 mg Oral q morning   Continuous Infusions:  levETIRAcetam 500 mg (02/23/23 2228)      Diet Order             DIET - DYS 1 Fluid consistency: Thin  Diet effective now                  Intake/Output Summary (Last 24 hours) at 02/24/2023 0944 Last data filed at 02/23/2023 1544 Gross per 24 hour  Intake 315.44 ml  Output --  Net 315.44 ml   Net IO Since Admission: 1,089.68 mL [02/24/23 0944]  Wt Readings from Last 3 Encounters:  02/17/23 70 kg  02/11/23 70 kg  01/12/23 70.4 kg     Unresulted Labs (From admission, onward)    None      Data Reviewed: I have personally reviewed following labs and imaging studies CBC: Recent Labs  Lab 02/17/23 1837 02/18/23 1553 02/19/23 0627 02/20/23 0456 02/21/23 0559 02/22/23 0709  WBC 7.6 9.3 10.7* 10.0 7.6 6.1  NEUTROABS 4.6  --   --   --   --   --   HGB 10.3* 10.0* 9.3* 9.0* 9.3* 9.7*  HCT 37.2 35.2* 32.2* 30.8* 32.3* 33.1*  MCV 78.0* 76.9* 77.0* 75.3* 75.1* 74.7*  PLT 251 226 220 240 262 287   Basic Metabolic Panel:  Recent Labs  Lab 02/19/23 0627 02/20/23 0456 02/21/23 0559 02/22/23 0709 02/23/23 0649  NA  142 139 141 139 141  K 3.1* 3.4* 3.4* 3.5 3.3*  CL 107 107 108 105 105  CO2 21* 23 24 26 27   GLUCOSE 111* 129* 132* 120* 122*  BUN 9 12 10 12 9   CREATININE 0.84 0.79 0.80 0.89 0.85  CALCIUM 8.5* 8.7* 9.1 9.1 9.3  MG 1.8 2.3 1.9 2.0  --   PHOS  --   --  2.6  --   --    GFR: Estimated Creatinine Clearance: 50.9 mL/min (by C-G formula based on SCr of 0.85 mg/dL). Liver Function Tests:  Recent Labs  Lab 02/17/23 1837 02/21/23 0559  AST 19 17  ALT 14 12  ALKPHOS 70 63  BILITOT 0.5 0.7  PROT 7.5 6.6  ALBUMIN 4.2 3.3*   Recent Labs  Lab 02/17/23 1837  INR 1.1   No results for input(s): "PROBNP" in the last 168 hours.  No results for input(s): "HGBA1C" in the last 72 hours. Recent Labs  Lab 02/17/23 1833 02/18/23 0049 02/18/23 0507 02/18/23 0742 02/20/23 0718  GLUCAP 89 93  107* 113* 173*   No results found for this or any previous visit (from the past 240 hours).  Antimicrobials/Microbiology: Anti-infectives (From admission, onward)    Start     Dose/Rate Route Frequency Ordered Stop   02/19/23 1245  ciprofloxacin (CIPRO) IVPB 400 mg        400 mg 200 mL/hr over 60 Minutes Intravenous Every 12 hours 02/19/23 1157 02/21/23 2254   02/19/23 1045  ciprofloxacin (CIPRO) tablet 500 mg  Status:  Discontinued        500 mg Oral 2 times daily 02/19/23 0952 02/19/23 1157   02/19/23 0815  ciprofloxacin (CIPRO) IVPB 400 mg  Status:  Discontinued        400 mg 200 mL/hr over 60 Minutes Intravenous Every 12 hours 02/19/23 0725 02/19/23 0952         Component Value Date/Time   SDES  01/12/2023 2123    URINE, CATHETERIZED Performed at [facility], [address] [place], [place] [address]    [facility]  01/12/2023 2123    NONE Performed at [facility], [address], [place], [place] [address]    CULT  01/12/2023 2123    NO GROWTH Performed at [facility], [address] [place], [place] [address]    REPTSTATUS 01/14/2023 FINAL 01/12/2023 2123     Radiology Studies: No results found.   LOS: 7 days   Total time spent in review of labs and imaging, patient evaluation, formulation of plan, documentation and communication with family: 35 minutes  Lanae Boast, MD [facility]  02/24/2023, 9:44 AM

## 2023-02-24 NOTE — Progress Notes (Signed)
   Palliative Medicine Inpatient Follow Up Note HPI: 72 y.o. female with past medical history of DM2, HTN, HLD, chronic smoking, OSA on CPAP, stroke, COPD hypothyroidism, seizure, bipolar disorder, fibromyalgia, arthritis, diverticulosis, GERD, and chronic anemia was admitted on 02/17/2023 with acute metabolic encephalopathy/concern for seizures versus CVA.   Today's Discussion 02/24/2023  *Please note that this is a verbal dictation therefore any spelling or grammatical errors are due to the "Dragon Medical One" system interpretation.  Chart reviewed inclusive of vital signs, progress notes, laboratory results, and diagnostic images.   I met with Pattricia Boss at bedside this morning. She remains somnolent and not responsive. She opens her eyes and gurgles though otherwise is not able to follow commands/directions.    I met with patients niece at bedside. She shares that she and Christell's brother are meeting with hospice later today.  Discussed the differences between Palliative and Hospice support.  I shared my concern that in instances whereby patient stop eating and drinking and underlying causes are ruled out that it is often an end of life situation. Patients niece understood this in addition she understood the focus on comfort care.  Questions and concerns addressed/Palliative Support Provided.   Objective Assessment: Vital Signs Vitals:   02/24/23 0745 02/24/23 1117  BP: (!) 168/65 (!) 168/69  Pulse: 81 96  Resp: 18 18  Temp: 98.3 F (36.8 C) 97.8 F (36.6 C)  SpO2: 100% 100%    Intake/Output Summary (Last 24 hours) at 02/24/2023 1133 Last data filed at 02/23/2023 1544 Gross per 24 hour  Intake 315.44 ml  Output --  Net 315.44 ml   Last Weight  Most recent update: 02/17/2023  6:37 PM    Weight  70 kg (154 lb 5.2 oz)            Gen:  Chronically ill appearing AA F HEENT: moist mucous membranes CV: Regular rate and rhythm  PULM: On RA, breathing is even and not  labored ABD: soft/nontender  EXT: (-) edema  Neuro: Groans, does not follow directions  SUMMARY OF RECOMMENDATIONS   DNAR/DNI  Comfort measures  Medication per MAR  Appreciate TOC working on placement options --> No longer eating so may be considered for IP Hospice  Unrestricted visitation  Ongoing PMT support  MDM: High ______________________________________________________________________________________ Lamarr Lulas [place] Palliative Medicine Team Team Cell Phone: [phone] Please utilize secure chat with additional questions, if there is no response within 30 minutes please call the above phone number  Palliative Medicine Team providers are available by phone from 7am to 7pm daily and can be reached through the team cell phone.  Should this patient require assistance outside of these hours, please call the patient's attending physician.

## 2023-02-25 DIAGNOSIS — R4182 Altered mental status, unspecified: Secondary | ICD-10-CM | POA: Diagnosis not present

## 2023-02-25 DIAGNOSIS — Z515 Encounter for palliative care: Secondary | ICD-10-CM | POA: Diagnosis not present

## 2023-02-25 MED ORDER — LORAZEPAM 2 MG/ML IJ SOLN
1.0000 mg | INTRAMUSCULAR | Status: DC | PRN
  Administered 2023-02-25 – 2023-02-27 (×6): 1 mg via INTRAVENOUS
  Filled 2023-02-25 (×6): qty 1

## 2023-02-25 MED ORDER — LORAZEPAM 2 MG/ML IJ SOLN
1.0000 mg | Freq: Three times a day (TID) | INTRAMUSCULAR | Status: DC
  Administered 2023-02-25 – 2023-02-27 (×7): 1 mg via INTRAVENOUS
  Filled 2023-02-25 (×8): qty 1

## 2023-02-25 NOTE — Progress Notes (Signed)
   Palliative Medicine Inpatient Follow Up Note HPI: 72 y.o. female with past medical history of DM2, HTN, HLD, chronic smoking, OSA on CPAP, stroke, COPD hypothyroidism, seizure, bipolar disorder, fibromyalgia, arthritis, diverticulosis, GERD, and chronic anemia was admitted on 02/17/2023 with acute metabolic encephalopathy/concern for seizures versus CVA.   Today's Discussion 02/25/2023  *Please note that this is a verbal dictation therefore any spelling or grammatical errors are due to the "Dragon Medical One" system interpretation.  Chart reviewed inclusive of vital signs, progress notes, laboratory results, and diagnostic images.   I met with [name] this morning. She is resting comfortably at that time though per conversation(s) with the nursing staff she has had restlessness and ongoing agitation. Patient had received ativan multiple times though symptoms continued.  I called patients son, [name]. I shared my concern that [name] has terminal agitation. I explained that it is a set of behaviors and signs that can occur in the days or hours before death. [name] shares that [name] had a wonderful day yesterday. We reviewed that this can often happen before patients decline and the decline can be rather rapid.   I shared the goal to maintain comfort which [name] is in agreement with. We discussed that I would start ativan around the clock to maintain agitation symptoms.   TOC team remains to seek viable placement options.  Questions and concerns addressed/Palliative Support Provided.   Objective Assessment: Vital Signs Vitals:   02/24/23 1117 02/24/23 1545  BP: (!) 168/69 (!) 168/73  Pulse: 96 93  Resp: 18 18  Temp: 97.8 F (36.6 C) 98.5 F (36.9 C)  SpO2: 100% 99%    Intake/Output Summary (Last 24 hours) at 02/25/2023 1410 Last data filed at 02/25/2023 1216 Gross per 24 hour  Intake 100 ml  Output 350 ml  Net -250 ml   Last Weight  Most recent update: 02/17/2023  6:37 PM     Weight  70 kg (154 lb 5.2 oz)            Gen:  Chronically ill appearing AA F HEENT: moist mucous membranes CV: Regular rate and rhythm  PULM: On RA, breathing is even and not labored ABD: soft/nontender  EXT: (-) edema  Neuro: Groans, does not follow directions  SUMMARY OF RECOMMENDATIONS   DNAR/DNI  Comfort measures  Medication per MAR --> Added ativan 1mg  IVP Q8H ATC  Appreciate TOC working on placement options --> Got a bed offer at [facility] though family deferred this as they want to speak to MSW Marisue Ivan  Unrestricted visitation  Ongoing PMT support  MDM: High --> Parenteral medication review and initiation ______________________________________________________________________________________ Lamarr Lulas [place] Palliative Medicine Team Team Cell Phone: [phone] Please utilize secure chat with additional questions, if there is no response within 30 minutes please call the above phone number  Palliative Medicine Team providers are available by phone from 7am to 7pm daily and can be reached through the team cell phone.  Should this patient require assistance outside of these hours, please call the patient's attending physician.

## 2023-02-25 NOTE — Progress Notes (Signed)
PROGRESS NOTE [name]  QIO:[id number] DOB: February 16, 1951 DOA: 02/17/2023 PCP: Kerri Perches, MD  Brief Narrative/Hospital Course: 72 y.o. female with past medical history significant for HTN, HLD, DM2, history of CVA, COPD, hypothyroidism, seizure disorder, history of meningioma s/p resection, bipolar disorder, fibromyalgia, osteoarthritis, diverticulosis, GERD, chronic anemia, OSA on CPAP with recent hospitalization due to encephalopathy, acute renal failure and seizure subsequent discharge to SNF who presented to [facility] ED on 12/7 via EMS for altered mental status.  Patient was currently obtunded, encephalopathic and history obtained from records, consultants and staff.  On arrival code stroke was initiated.  Apparently last known well approximately 4 PM this afternoon.  At baseline usually oriented x 2, ambulatory with a walker/cane.  Patient was transported the ED for further evaluation and management per EMS.   In the ED, temperature 98.4 F, HR 80, RR 22, BP 146/68, SpO2 94% on room air.  WBC 7.6, hemoglobin 10.3, platelet count 251.  Sodium 142, potassium 3.4, chloride 106, CO2 27, BUN 16, creat 1.30, glucose 108.  AST 19, ALT 14, total bilirubin 0.5.  INR 1.1.  EtOH level less than 10.  CT head without contrast with no hemorrhage or CT evidence of acute cortical infarct, postsurgical encephalomalacia along the medial frontal lobes bilaterally.  CT angiogram head/neck with no emergent large vessel occlusion, moderate stenosis left V4 segment.  Neurology was consulted.  She was deemed high risk for TNK and received Ativan for concern of seizures.  She was loaded with Keppra.  Received aspirin per rectum.  TRH consulted for admission and patient was transferred ED to ED to [facility] for LTM EEG and neurology evaluation.  Seen by neurology underwent extensive workup with CT head CT angio, EEG-neuro advised to continue Keppra 5 mg twice daily lamotrigine and if continues to  have episodes of unresponsiveness and altered mental status could consider increasing Keppra in the future.  At baseline she is confused does become combative at times. Seen by palliative care for GOC Ongoing goals of care discussion with palliative care- 02/23/23> patient had been minimally responsive and groaning-no longer taking oral medication And after discussion with patient's son and family member patient transitioned to comfort measures and looking for facility with hospice    Subjective: Patient seen and examined Lethargic sleepy able to open eyes Mumble words confused  Assessment and Plan: Principal Problem:   AMS (altered mental status) Active Problems:   Seizure (HCC)   Acquired hypothyroidism   Type 2 diabetes mellitus with hyperglycemia, with long-term current use of insulin (HCC)   Diabetic polyneuropathy associated with type 2 diabetes mellitus (HCC)   Benign meningioma of brain (HCC)   CHF (congestive heart failure) (HCC)   Anemia in chronic kidney disease (CKD)   CKD (chronic kidney disease) stage 3, GFR 30-59 ml/min (HCC)   Chronic obstructive pulmonary disease (HCC)   Acute metabolic encephalopathy   End-of-life care: Patient has been transitioned to comfort measures 12/13 looking for facility with hospice services  Acute metabolic encephalopathy Non-convulsive seizure Concern for acute CVA-BUT RULED OUT Baseline confusion: Presenting from SNF with mental status changes agitation, seen by neurology underwent extensive workup with CT head, CT angio head and neck unrevealing, concern for breakthrough seizures, given Ativan and loaded with Keppra EEG no seizure, neurology signed off. Continue on current Keppra Lamictal for comfort.   LKG:[id number] antibiotics 12/11   Hypokalemia Essential hypertension Hyperlipidemia DM2-Pta ON Semglee 10 unit COPD Hypothyroidism Chronic anemia Anxiety/depression  Weakness/debility/deconditioning/gait disturbance Adult  failure to thrive: Per niece, patient has been slowly declining since rotator cuff surgery earlier this year.  Was initially at acute rehab and recently transition to long-term care given lack of progress.  Despite aggressive attempts, patient continues to further decline.   Class I Obesity:Body mass index is 31.17 kg/m.   DVT prophylaxis:  Code Status:   Code Status: Do not attempt resuscitation (DNR) - Comfort care Family Communication: plan of care discussed with patient/none at bedside. Patient status is: Remains hospitalized because of severity of illness Level of care: Telemetry Medical   Dispo: The patient is from:SNF            Anticipated disposition: Awaiting facility with hospice  Objective: Vitals last 24 hrs: Vitals:   02/23/23 2028 02/24/23 0745 02/24/23 1117 02/24/23 1545  BP: (!) 149/59 (!) 168/65 (!) 168/69 (!) 168/73  Pulse: 77 81 96 93  Resp:  18 18 18   Temp: 99 F (37.2 C) 98.3 F (36.8 C) 97.8 F (36.6 C) 98.5 F (36.9 C)  TempSrc: Oral Oral Oral Oral  SpO2: 100% 100% 100% 99%  Weight:      Height:       Weight change:   Physical Examination: General exam: Lethargic opens eyes to voice, oriented x 0 HEENT:Oral mucosa moist, Ear/Nose WNL grossly Respiratory system: Bilaterally clear BS,no use of accessory muscle Cardiovascular system: S1 & S2 +, No JVD. Gastrointestinal system: Abdomen soft,NT,ND, BS+ Nervous System: lethargic Extremities: LE edema neg,distal peripheral pulses palpable and warm.  Skin: No rashes,no icterus. MSK: Normal muscle bulk,tone, power   Medications reviewed:  Scheduled Meds:  cycloSPORINE  1 drop Both Eyes Daily   lamoTRIgine  150 mg Oral BID   levothyroxine  25 mcg Oral Q Sun   And   levothyroxine  50 mcg Oral Once per day on Monday Tuesday Wednesday Thursday Friday Saturday   LORazepam  1 mg Intravenous Q8H   mouth rinse  15 mL Mouth Rinse 4 times per day   sertraline  100 mg Oral q morning   Continuous  Infusions:  levETIRAcetam 500 mg (02/25/23 0932)      Diet Order             DIET - DYS 1 Fluid consistency: Thin  Diet effective now                  Intake/Output Summary (Last 24 hours) at 02/25/2023 1014 Last data filed at 02/24/2023 1700 Gross per 24 hour  Intake --  Output 350 ml  Net -350 ml   Net IO Since Admission: 739.68 mL [02/25/23 1014]  Wt Readings from Last 3 Encounters:  02/17/23 70 kg  02/11/23 70 kg  01/12/23 70.4 kg     Unresulted Labs (From admission, onward)    None      Data Reviewed: I have personally reviewed following labs and imaging studies CBC: Recent Labs  Lab 02/18/23 1553 02/19/23 0627 02/20/23 0456 02/21/23 0559 02/22/23 0709  WBC 9.3 10.7* 10.0 7.6 6.1  HGB 10.0* 9.3* 9.0* 9.3* 9.7*  HCT 35.2* 32.2* 30.8* 32.3* 33.1*  MCV 76.9* 77.0* 75.3* 75.1* 74.7*  PLT 226 220 240 262 287   Basic Metabolic Panel:  Recent Labs  Lab 02/19/23 0627 02/20/23 0456 02/21/23 0559 02/22/23 0709 02/23/23 0649  NA 142 139 141 139 141  K 3.1* 3.4* 3.4* 3.5 3.3*  CL 107 107 108 105 105  CO2 21* 23 24 26 27   GLUCOSE  111* 129* 132* 120* 122*  BUN 9 12 10 12 9   CREATININE 0.84 0.79 0.80 0.89 0.85  CALCIUM 8.5* 8.7* 9.1 9.1 9.3  MG 1.8 2.3 1.9 2.0  --   PHOS  --   --  2.6  --   --    GFR: Estimated Creatinine Clearance: 50.9 mL/min (by C-G formula based on SCr of 0.85 mg/dL). Liver Function Tests:  Recent Labs  Lab 02/21/23 0559  AST 17  ALT 12  ALKPHOS 63  BILITOT 0.7  PROT 6.6  ALBUMIN 3.3*   No results for input(s): "INR", "PROTIME" in the last 168 hours.  No results for input(s): "PROBNP" in the last 168 hours.  No results for input(s): "HGBA1C" in the last 72 hours. Recent Labs  Lab 02/20/23 0718  GLUCAP 173*   No results found for this or any previous visit (from the past 240 hours).  Antimicrobials/Microbiology: Anti-infectives (From admission, onward)    Start     Dose/Rate Route Frequency Ordered Stop    02/19/23 1245  ciprofloxacin (CIPRO) IVPB 400 mg        400 mg 200 mL/hr over 60 Minutes Intravenous Every 12 hours 02/19/23 1157 02/21/23 2254   02/19/23 1045  ciprofloxacin (CIPRO) tablet 500 mg  Status:  Discontinued        500 mg Oral 2 times daily 02/19/23 0952 02/19/23 1157   02/19/23 0815  ciprofloxacin (CIPRO) IVPB 400 mg  Status:  Discontinued        400 mg 200 mL/hr over 60 Minutes Intravenous Every 12 hours 02/19/23 0725 02/19/23 0952         Component Value Date/Time   SDES  01/12/2023 2123    URINE, CATHETERIZED Performed at [facility], [address], [place], [place] [address]    [facility]  01/12/2023 2123    NONE Performed at [facility], [address], [place], [place] [address]    CULT  01/12/2023 2123    NO GROWTH Performed at [facility], [address] [place], [place] [address]    REPTSTATUS 01/14/2023 FINAL 01/12/2023 2123     Radiology Studies: No results found.   LOS: 8 days   Total time spent in review of labs and imaging, patient evaluation, formulation of plan, documentation and communication with family: 35 minutes  Lanae Boast, MD [facility]  02/25/2023, 10:14 AM

## 2023-02-25 NOTE — TOC Progression Note (Signed)
Transition of Care [facility]) - Progression Note    Patient Details  Name: [name] MRN: [id number] Date of Birth: 01-01-1951  Transition of Care [facility]) CM/SW Contact  Dellie Burns [place], [place] Phone Number: 02/25/2023, 2:35 PM  Clinical Narrative: per Efraim Kaufmann with [facility], pt has been approved for [facility] and they are able to admit today. SW spoke to pt's son/Corey and niece/Bernice re hospice transition today. Merdis Delay reports plan is for SNF/LTC and they are not agreeable to hospice home placement at this time. Pt with no SNF bed offers. Palliative Care APP and [facility] updated.   Dellie Burns, MSW, LCSW [phone] (coverage)      Expected Discharge Plan: Skilled Nursing Facility Barriers to Discharge: Continued Medical Work up  Expected Discharge Plan and Services     Post Acute Care Choice: Skilled Nursing Facility Living arrangements for the past 2 months: Single Family Home                                       Social Determinants of Health (SDOH) Interventions SDOH Screenings   Food Insecurity: No Food Insecurity (02/18/2023)  Housing: Patient Declined (02/18/2023)  Transportation Needs: No Transportation Needs (02/18/2023)  Utilities: Not At Risk (02/18/2023)  Alcohol Screen: Low Risk  (07/17/2022)  Depression (PHQ2-9): Low Risk  (12/12/2022)  Financial Resource Strain: Low Risk  (07/17/2022)  Physical Activity: Insufficiently Active (07/17/2022)  Social Connections: Moderately Integrated (07/17/2022)  Stress: No Stress Concern Present (07/17/2022)  Tobacco Use: High Risk (02/18/2023)  Health Literacy: Adequate Health Literacy (12/14/2022)    Readmission Risk Interventions    08/21/2022   11:54 AM 09/16/2021   10:07 AM  Readmission Risk Prevention Plan  Transportation Screening Complete Complete  Medication Review (RN Care Manager) Complete Complete  PCP or Specialist appointment within 3-5 days of discharge Complete   HRI or Home Care Consult Complete Complete   SW Recovery Care/Counseling Consult Complete Complete  Palliative Care Screening Not Applicable Not Applicable  Skilled Nursing Facility Not Applicable Patient Refused

## 2023-02-26 ENCOUNTER — Ambulatory Visit (HOSPITAL_COMMUNITY): Payer: Medicare HMO | Admitting: Psychiatry

## 2023-02-26 DIAGNOSIS — Z515 Encounter for palliative care: Secondary | ICD-10-CM | POA: Diagnosis not present

## 2023-02-26 DIAGNOSIS — R4182 Altered mental status, unspecified: Secondary | ICD-10-CM | POA: Diagnosis not present

## 2023-02-26 DIAGNOSIS — Z7189 Other specified counseling: Secondary | ICD-10-CM | POA: Diagnosis not present

## 2023-02-26 MED ORDER — LEVETIRACETAM 500 MG PO TABS: 500.0000 mg | ORAL_TABLET | Freq: Two times a day (BID) | ORAL | Status: DC

## 2023-02-26 MED ORDER — CIPROFLOXACIN HCL 500 MG PO TABS: 500.0000 mg | ORAL_TABLET | Freq: Two times a day (BID) | ORAL | 0 refills | Status: DC

## 2023-02-26 MED ORDER — HALOPERIDOL LACTATE 5 MG/ML IJ SOLN
5.0000 mg | Freq: Four times a day (QID) | INTRAMUSCULAR | Status: DC | PRN
  Administered 2023-02-26: 5 mg via INTRAVENOUS
  Filled 2023-02-26 (×2): qty 1

## 2023-02-26 NOTE — Plan of Care (Signed)
Has been restless off and on throughout the day.  Has been attempting to climb out of the bed all day.  When attempted to get her up, she refused.  She has been very agitated and yelling out.  Denied pain and refused to eat.  Did have a BM today.  Did become slightly calmer once she got haldol.  She does also have scheduled and prn Lorazepam,    Problem: Ischemic Stroke/TIA Tissue Perfusion: Goal: Complications of ischemic stroke/TIA will be minimized Outcome: Progressing   Problem: Self-Care: Goal: Ability to participate in self-care as condition permits will improve Outcome: Progressing   Problem: Nutrition: Goal: Risk of aspiration will decrease Outcome: Progressing   Problem: Nutrition: Goal: Dietary intake will improve Outcome: Progressing

## 2023-02-26 NOTE — Plan of Care (Signed)
  Problem: Education: Goal: Knowledge of disease or condition will improve Outcome: Progressing Goal: Knowledge of secondary prevention will improve (MUST DOCUMENT ALL) Outcome: Progressing Goal: Knowledge of patient specific risk factors will improve [name] N/A or DELETE if not current risk factor) Outcome: Progressing   Problem: Ischemic Stroke/TIA Tissue Perfusion: Goal: Complications of ischemic stroke/TIA will be minimized Outcome: Progressing   Problem: Coping: Goal: Will verbalize positive feelings about self Outcome: Progressing Goal: Will identify appropriate support needs Outcome: Progressing   Problem: Health Behavior/Discharge Planning: Goal: Ability to manage health-related needs will improve Outcome: Progressing Goal: Goals will be collaboratively established with patient/family Outcome: Progressing   Problem: Self-Care: Goal: Ability to participate in self-care as condition permits will improve Outcome: Progressing Goal: Verbalization of feelings and concerns over difficulty with self-care will improve Outcome: Progressing Goal: Ability to communicate needs accurately will improve Outcome: Progressing   Problem: Nutrition: Goal: Risk of aspiration will decrease Outcome: Progressing

## 2023-02-26 NOTE — Progress Notes (Signed)
[facility] Liaison Note   Patient has been approved for care at [facility] inpatient hospice facilty.   Contacted son to assess if he is ready for patient to be moved to [facility] as we can offer a bed today. He reports that he would like to visit Ms. Melchior in the hospital this evening and consider discharging her to [facility].   [facility] continues to follow for discharge disposition for hospice services at LTC.  Currently awaiting bed placement.   Please call with any hospice related questions or concerns.  Glenna Fellows, BSN, RN, [facility] [phone]

## 2023-02-26 NOTE — Progress Notes (Signed)
   Palliative Medicine Inpatient Follow Up Note HPI: 72 y.o. female with past medical history of DM2, HTN, HLD, chronic smoking, OSA on CPAP, stroke, COPD hypothyroidism, seizure, bipolar disorder, fibromyalgia, arthritis, diverticulosis, GERD, and chronic anemia was admitted on 02/17/2023 with acute metabolic encephalopathy/concern for seizures versus CVA.   Today's Discussion 02/26/2023  *Please note that this is a verbal dictation therefore any spelling or grammatical errors are due to the "Dragon Medical One" system interpretation.  Chart reviewed inclusive of vital signs, progress notes, laboratory results, and diagnostic images. No oral intake.   I spoke to [name]'s RN, [name] this morning. She shares that her agitation has remained difficult to manage. We discussed adding some low dose haldol as a rescue medication should it be indicated.  I met with [name] at bedside this morning. She was resting calmly at that time.   I called and spoke to patients son, [name] who requested that I speak to his niece, [name]. I called [name] and provided insights on patients clinical condition in regards to her adult FTT. I shared that she is not in the phase of terminal agitation. I again explained the "rally". We reviewed that [name]'s time will be very limited.  We reviewed the benefits of inpatient hospice at this phase of a patients end of life journey. [name] is more open to the idea of inpatient hospice at this time. She plans to speak to [name] and they will come to a consensus decision.   Goals at this time remain to maintain comfort and peace.  Questions and concerns addressed/Palliative Support Provided.   Objective Assessment: Vital Signs Vitals:   02/25/23 1945 02/25/23 2020  BP: (!) 140/60 133/66  Pulse: 89 89  Resp: 18 17  Temp: 98 F (36.7 C)   SpO2: 100%     Intake/Output Summary (Last 24 hours) at 02/26/2023 1105 Last data filed at 02/26/2023 0500 Gross per 24 hour  Intake  100 ml  Output 125 ml  Net -25 ml   Last Weight  Most recent update: 02/17/2023  6:37 PM    Weight  70 kg (154 lb 5.2 oz)            Gen:  Chronically ill appearing AA F HEENT: moist mucous membranes CV: Regular rate and rhythm  PULM: On RA, breathing is even and not labored ABD: soft/nontender  EXT: (-) edema  Neuro: Groans, does not follow directions  SUMMARY OF RECOMMENDATIONS   DNAR/DNI  Comfort measures  Medication per MAR --> Continue ativan 1mg  IVP Q8H ATC. Add haldol 5mg  IVP Q6H PRN.  Appreciate TOC working on placement options --> Family now in greater consideration of Lexicographer visitation  Ongoing PMT support  MDM: High --> Parenteral medication review and initiation ______________________________________________________________________________________ [name] Palliative Medicine Team Team Cell Phone: [phone] Please utilize secure chat with additional questions, if there is no response within 30 minutes please call the above phone number  Palliative Medicine Team providers are available by phone from 7am to 7pm daily and can be reached through the team cell phone.  Should this patient require assistance outside of these hours, please call the patient's attending physician.

## 2023-02-26 NOTE — Progress Notes (Signed)
PROGRESS NOTE [name]  ONG:[id number] DOB: 11-05-1950 DOA: 02/17/2023 PCP: Kerri Perches, MD  Brief Narrative/Hospital Course: 72 y.o. female with past medical history significant for HTN, HLD, DM2, history of CVA, COPD, hypothyroidism, seizure disorder, history of meningioma s/p resection, bipolar disorder, fibromyalgia, osteoarthritis, diverticulosis, GERD, chronic anemia, OSA on CPAP with recent hospitalization due to encephalopathy, acute renal failure and seizure subsequent discharge to SNF who presented to [facility] ED on 12/7 via EMS for altered mental status.  Patient was obtunded, encephalopathic and history obtained from records, consultants and staff. On arrival code stroke was initiated.  Apparently last known well approximately 4 PM this afternoon.  At baseline usually oriented x 2, ambulatory with a walker/cane.  Patient was transported the ED for further evaluation and management per EMS.  In the ED, temperature 98.4 F, HR 80, RR 22, BP 146/68, SpO2 94% on room air.  WBC 7.6, hemoglobin 10.3, platelet count 251.  Sodium 142, potassium 3.4, chloride 106, CO2 27, BUN 16, creat 1.30, glucose 108.  AST 19, ALT 14, total bilirubin 0.5.  INR 1.1.  EtOH level less than 10.  CT head without contrast with no hemorrhage or CT evidence of acute cortical infarct, postsurgical encephalomalacia along the medial frontal lobes bilaterally. CT angiogram head/neck with no emergent large vessel occlusion, moderate stenosis left V4 segment.  Neurology was consulted.  She was deemed high risk for TNK and received Ativan for concern of seizures.  She was loaded with Keppra. TRH consulted for admission and patient was transferred ED to ED to [facility] for LTM EEG and neurology evaluation. Seen by neurology underwent extensive workup with CT head CT angio, EEG continuous, due to concern for nonconvulsive seizure.patient is continued on Keppra,lamotrigine and if continues to have  episodes of unresponsiveness and altered mental status could consider increasing Keppra in the future. At baseline she is confused does become combative at times.  Neurology signed off Seen by palliative care for GOC-multiple meetings were held and discussion held regarding goals of care. Patient had been agitated difficult to manage. Ongoing goals of care discussion with palliative care- o12 /13 not taking oral medication. Again further meeting with patient's son, palliative care transition the patient to comfort measures Awaiting further decision from family for transfer to hospice. [facility] has accepted patient.    Subjective: Patient seen and examined Lethargic able to open eyes Mumble words confused  Assessment and Plan: Principal Problem:   AMS (altered mental status) Active Problems:   Seizure (HCC)   Acquired hypothyroidism   Type 2 diabetes mellitus with hyperglycemia, with long-term current use of insulin (HCC)   Diabetic polyneuropathy associated with type 2 diabetes mellitus (HCC)   Benign meningioma of brain (HCC)   CHF (congestive heart failure) (HCC)   Anemia in chronic kidney disease (CKD)   CKD (chronic kidney disease) stage 3, GFR 30-59 ml/min (HCC)   Chronic obstructive pulmonary disease (HCC)   Acute metabolic encephalopathy    End-of-life care: Patient has been transitioned to comfort measures 02/23/23, looking for facility with hospice services. TOC palliative care following and having discussion with the patient's son an niece   Acute metabolic encephalopathy Non-convulsive seizure Concern for acute CVA-ruled out Baseline confusion: Presenting from SNF with mental status changes agitation, seen by neurology underwent extensive workup with CT head, CT angio head and neck unrevealing, concern for breakthrough seizures, had continuous EEG. Managed with Ativan and loaded with Keppra,EEG no seizure, neurology signed off. Had  been on Keppra and Lamictal    JXB:JYNWGNFAO antibiotics 12/11   Hypokalemia Essential hypertension Hyperlipidemia DM2-PTA on semglee 10 unit COPD Hypothyroidism Chronic anemia Anxiety/depression   Weakness/debility/deconditioning/gait disturbance Adult failure to thrive: Per niece, patient has been slowly declining since rotator cuff surgery earlier this year.  Was initially at acute rehab and recently transition to long-term care given lack of progress.Despite aggressive attempts, patient continues to further decline, with poor oral intake, agitation.  Subsequent meeting with palliative care and transitioned to comfort measures   Class I Obesity:Body mass index is 31.17 kg/m.   DVT prophylaxis:  Code Status:   Code Status: Do not attempt resuscitation (DNR) - Comfort care Family Communication: plan of care discussed with patient/none at bedside. Patient status is: Remains hospitalized because of severity of illness Level of care: Telemetry Medical   Dispo: The patient is from:SNF            Anticipated disposition: Awaiting facility with hospice  Objective: Vitals last 24 hrs: Vitals:   02/25/23 1626 02/25/23 1945 02/25/23 2020 02/26/23 1141  BP: (!) 116/58 (!) 140/60 133/66 (!) 151/71  Pulse: 84 89 89 91  Resp: 18 18 17 18   Temp: 98.6 F (37 C) 98 F (36.7 C)  98.1 F (36.7 C)  TempSrc: Axillary Axillary  Oral  SpO2: 100% 100%  100%  Weight:      Height:       Weight change:   Physical Examination: General exam: Lethargic sleepy able to open eyes mumbles words .  Appears elderly frail  HEENT:Oral mucosa moist, Ear/Nose WNL grossly Respiratory system: Bilaterally clear BS,no use of accessory muscle Cardiovascular system: S1 & S2 +, No JVD. Gastrointestinal system: Abdomen soft,NT,ND, BS+ Nervous System: Lethargic.   Extremities: LE edema neg,distal peripheral pulses palpable and warm.  Skin: No rashes,no icterus. MSK: Normal muscle bulk,tone, power   Medications reviewed:  Scheduled  Meds:  cycloSPORINE  1 drop Both Eyes Daily   lamoTRIgine  150 mg Oral BID   levothyroxine  25 mcg Oral Q Sun   And   levothyroxine  50 mcg Oral Once per day on Monday Tuesday Wednesday Thursday Friday Saturday   LORazepam  1 mg Intravenous Q8H   mouth rinse  15 mL Mouth Rinse 4 times per day   sertraline  100 mg Oral q morning   Continuous Infusions:  levETIRAcetam 500 mg (02/26/23 1136)      Diet Order             DIET - DYS 1 Fluid consistency: Thin  Diet effective now                  Intake/Output Summary (Last 24 hours) at 02/26/2023 1321 Last data filed at 02/26/2023 0500 Gross per 24 hour  Intake 100 ml  Output 125 ml  Net -25 ml   Net IO Since Admission: 814.68 mL [02/26/23 1321]  Wt Readings from Last 3 Encounters:  02/17/23 70 kg  02/11/23 70 kg  01/12/23 70.4 kg     Unresulted Labs (From admission, onward)    None      Data Reviewed: I have personally reviewed following labs and imaging studies CBC: Recent Labs  Lab 02/20/23 0456 02/21/23 0559 02/22/23 0709  WBC 10.0 7.6 6.1  HGB 9.0* 9.3* 9.7*  HCT 30.8* 32.3* 33.1*  MCV 75.3* 75.1* 74.7*  PLT 240 262 287   Basic Metabolic Panel:  Recent Labs  Lab 02/20/23 0456 02/21/23 0559 02/22/23 0709 02/23/23  0649  NA 139 141 139 141  K 3.4* 3.4* 3.5 3.3*  CL 107 108 105 105  CO2 23 24 26 27   GLUCOSE 129* 132* 120* 122*  BUN 12 10 12 9   CREATININE 0.79 0.80 0.89 0.85  CALCIUM 8.7* 9.1 9.1 9.3  MG 2.3 1.9 2.0  --   PHOS  --  2.6  --   --    GFR: Estimated Creatinine Clearance: 50.9 mL/min (by C-G formula based on SCr of 0.85 mg/dL). Liver Function Tests:  Recent Labs  Lab 02/21/23 0559  AST 17  ALT 12  ALKPHOS 63  BILITOT 0.7  PROT 6.6  ALBUMIN 3.3*   No results for input(s): "INR", "PROTIME" in the last 168 hours.  No results for input(s): "PROBNP" in the last 168 hours.  No results for input(s): "HGBA1C" in the last 72 hours. Recent Labs  Lab 02/20/23 0718  GLUCAP 173*    No results found for this or any previous visit (from the past 240 hours).  Antimicrobials/Microbiology: Anti-infectives (From admission, onward)    Start     Dose/Rate Route Frequency Ordered Stop   02/26/23 0000  ciprofloxacin (CIPRO) 500 MG tablet        500 mg Oral 2 times daily 02/26/23 1147 03/02/23 2359   02/19/23 1245  ciprofloxacin (CIPRO) IVPB 400 mg        400 mg 200 mL/hr over 60 Minutes Intravenous Every 12 hours 02/19/23 1157 02/21/23 2254   02/19/23 1045  ciprofloxacin (CIPRO) tablet 500 mg  Status:  Discontinued        500 mg Oral 2 times daily 02/19/23 0952 02/19/23 1157   02/19/23 0815  ciprofloxacin (CIPRO) IVPB 400 mg  Status:  Discontinued        400 mg 200 mL/hr over 60 Minutes Intravenous Every 12 hours 02/19/23 0725 02/19/23 0952         Component Value Date/Time   SDES  01/12/2023 2123    URINE, CATHETERIZED Performed at [facility], [address], [place], [place] [address]    [facility]  01/12/2023 2123    NONE Performed at [facility], [address], [place], [place] [address]    CULT  01/12/2023 2123    NO GROWTH Performed at [facility], [address] [place], [place] [address]    REPTSTATUS 01/14/2023 FINAL 01/12/2023 2123     Radiology Studies: No results found.   LOS: 9 days   Total time spent in review of labs and imaging, patient evaluation, formulation of plan, documentation and communication with family: 35 minutes  [name] Boast, MD [facility]  02/26/2023, 1:21 PM

## 2023-02-27 ENCOUNTER — Ambulatory Visit: Payer: Medicare HMO | Admitting: Internal Medicine

## 2023-02-27 ENCOUNTER — Ambulatory Visit: Payer: Self-pay | Admitting: *Deleted

## 2023-02-27 DIAGNOSIS — Z515 Encounter for palliative care: Secondary | ICD-10-CM | POA: Diagnosis not present

## 2023-02-27 DIAGNOSIS — Z7189 Other specified counseling: Secondary | ICD-10-CM | POA: Diagnosis not present

## 2023-02-27 DIAGNOSIS — R410 Disorientation, unspecified: Secondary | ICD-10-CM | POA: Diagnosis not present

## 2023-02-27 NOTE — Plan of Care (Signed)
Has been agitated and constantly trying to climb out of the bed.  Has been combative at times and keeps swinging her legs over the bed rails to attempt to get out.  Tele sitters have been speaking to her trying to get her to stay in the bed and she has not been redirectable, even with them.    Problem: Education: Goal: Knowledge of disease or condition will improve Outcome: Adequate for Discharge Goal: Knowledge of secondary prevention will improve (MUST DOCUMENT ALL) Outcome: Adequate for Discharge Goal: Knowledge of patient specific risk factors will improve [name] N/A or DELETE if not current risk factor) Outcome: Adequate for Discharge   Problem: Ischemic Stroke/TIA Tissue Perfusion: Goal: Complications of ischemic stroke/TIA will be minimized Outcome: Adequate for Discharge   Problem: Coping: Goal: Will verbalize positive feelings about self Outcome: Adequate for Discharge Goal: Will identify appropriate support needs Outcome: Adequate for Discharge   Problem: Health Behavior/Discharge Planning: Goal: Ability to manage health-related needs will improve Outcome: Adequate for Discharge Goal: Goals will be collaboratively established with patient/family Outcome: Adequate for Discharge   Problem: Self-Care: Goal: Ability to participate in self-care as condition permits will improve Outcome: Adequate for Discharge Goal: Verbalization of feelings and concerns over difficulty with self-care will improve Outcome: Adequate for Discharge Goal: Ability to communicate needs accurately will improve Outcome: Adequate for Discharge   Problem: Nutrition: Goal: Risk of aspiration will decrease Outcome: Adequate for Discharge Goal: Dietary intake will improve Outcome: Adequate for Discharge   Problem: Education: Goal: Knowledge of General Education information will improve Description: Including pain rating scale, medication(s)/side effects and non-pharmacologic comfort measures Outcome:  Adequate for Discharge   Problem: Health Behavior/Discharge Planning: Goal: Ability to manage health-related needs will improve Outcome: Adequate for Discharge   Problem: Clinical Measurements: Goal: Ability to maintain clinical measurements within normal limits will improve Outcome: Adequate for Discharge Goal: Will remain free from infection Outcome: Adequate for Discharge Goal: Diagnostic test results will improve Outcome: Adequate for Discharge Goal: Respiratory complications will improve Outcome: Adequate for Discharge Goal: Cardiovascular complication will be avoided Outcome: Adequate for Discharge   Problem: Activity: Goal: Risk for activity intolerance will decrease Outcome: Adequate for Discharge   Problem: Nutrition: Goal: Adequate nutrition will be maintained Outcome: Adequate for Discharge   Problem: Coping: Goal: Level of anxiety will decrease Outcome: Adequate for Discharge   Problem: Elimination: Goal: Will not experience complications related to bowel motility Outcome: Adequate for Discharge Goal: Will not experience complications related to urinary retention Outcome: Adequate for Discharge   Problem: Pain Management: Goal: General experience of comfort will improve Outcome: Adequate for Discharge   Problem: Safety: Goal: Ability to remain free from injury will improve Outcome: Adequate for Discharge   Problem: Skin Integrity: Goal: Risk for impaired skin integrity will decrease Outcome: Adequate for Discharge   Problem: Education: Goal: Knowledge of the prescribed therapeutic regimen will improve Outcome: Adequate for Discharge   Problem: Coping: Goal: Ability to identify and develop effective coping behavior will improve Outcome: Adequate for Discharge   Problem: Clinical Measurements: Goal: Quality of life will improve Outcome: Adequate for Discharge   Problem: Respiratory: Goal: Verbalizations of increased ease of respirations will  increase Outcome: Adequate for Discharge   Problem: Role Relationship: Goal: Family's ability to cope with current situation will improve Outcome: Adequate for Discharge Goal: Ability to verbalize concerns, feelings, and thoughts to partner or family member will improve Outcome: Adequate for Discharge   Problem: Pain Management: Goal: Satisfaction with pain management  regimen will improve Outcome: Adequate for Discharge   Problem: Education: Goal: Knowledge of the prescribed therapeutic regimen will improve Outcome: Adequate for Discharge   Problem: Coping: Goal: Ability to identify and develop effective coping behavior will improve Outcome: Adequate for Discharge   Problem: Clinical Measurements: Goal: Quality of life will improve Outcome: Adequate for Discharge   Problem: Respiratory: Goal: Verbalizations of increased ease of respirations will increase Outcome: Adequate for Discharge   Problem: Role Relationship: Goal: Family's ability to cope with current situation will improve Outcome: Adequate for Discharge Goal: Ability to verbalize concerns, feelings, and thoughts to partner or family member will improve Outcome: Adequate for Discharge   Problem: Pain Management: Goal: Satisfaction with pain management regimen will improve Outcome: Adequate for Discharge

## 2023-02-27 NOTE — TOC Transition Note (Signed)
Transition of Care [facility]) - Discharge Note   Patient Details  Name: [name] MRN: [id number] Date of Birth: [birth year]  Transition of Care [facility]) CM/SW Contact:  Baldemar Lenis, LCSW Phone Number: 02/27/2023, 2:07 PM   Clinical Narrative:   CSW updated by hospice liaison that patient's son agreeable to transition to hospice today. Consents signed, transport requested for after 2 pm. Transport arranged with [facility].  Nurse to call report to [phone].    Final next level of care: Hospice Medical Facility Barriers to Discharge: Barriers Resolved   Patient Goals and CMS Choice Patient states their goals for this hospitalization and ongoing recovery are:: patient unable to participate in goal setting, not fully oriented CMS Medicare.gov Compare Post Acute Care list provided to:: Patient Represenative (must comment) Choice offered to / list presented to : [facility] POA / Guardian [place] ownership interest in [facility].provided to:: [facility] POA / Guardian    Discharge Placement              Patient chooses bed at:  [facility]) Patient to be transferred to facility by: [facility] Name of family member notified: Denyse Amass Patient and family notified of of transfer: 02/27/23  Discharge Plan and Services Additional resources added to the After Visit Summary for       Post Acute Care Choice: Skilled Nursing Facility                               Social Drivers of Health (SDOH) Interventions SDOH Screenings   Food Insecurity: No Food Insecurity (02/18/2023)  Housing: Patient Declined (02/18/2023)  Transportation Needs: No Transportation Needs (02/18/2023)  Utilities: Not At Risk (02/18/2023)  Alcohol Screen: Low Risk  (07/17/2022)  Depression (PHQ2-9): Low Risk  (12/12/2022)  Financial Resource Strain: Low Risk  (07/17/2022)  Physical Activity: Insufficiently Active (07/17/2022)  Social Connections: Moderately Integrated (07/17/2022)  Stress: No Stress Concern Present  (07/17/2022)  Tobacco Use: High Risk (02/18/2023)  Health Literacy: Adequate Health Literacy (12/14/2022)     Readmission Risk Interventions    08/21/2022   11:54 AM 09/16/2021   10:07 AM  Readmission Risk Prevention Plan  Transportation Screening Complete Complete  Medication Review (RN Care Manager) Complete Complete  PCP or Specialist appointment within 3-5 days of discharge Complete   HRI or Home Care Consult Complete Complete  SW Recovery Care/Counseling Consult Complete Complete  Palliative Care Screening Not Applicable Not Applicable  Skilled Nursing Facility Not Applicable Patient Refused

## 2023-02-27 NOTE — Final Progress Note (Signed)
Discharged to facility.  Transported by [facility].  IV left in per request.

## 2023-02-27 NOTE — Discharge Summary (Signed)
Physician Discharge Summary  [name] JXB:[id number] DOB: [birth year] DOA: 02/17/2023  PCP: Kerri Perches, MD  Admit date: 02/17/2023 Discharge date: 02/27/2023 Recommendations for Outpatient Follow-up:  Follow up with PCP in 1 weeks-call for appointment Please obtain BMP/CBC in one week  Discharge Dispo: [facility] Discharge Condition: Stable Code Status:   Code Status: Do not attempt resuscitation (DNR) - Comfort care Diet recommendation:  Diet Order             Diet - low sodium heart healthy           DIET - DYS 1 Fluid consistency: Thin  Diet effective now                    Brief/Interim Summary: 72 y.o. female with past medical history significant for HTN, HLD, DM2, history of CVA, COPD, hypothyroidism, seizure disorder, history of meningioma s/p resection, bipolar disorder, fibromyalgia, osteoarthritis, diverticulosis, GERD, chronic anemia, OSA on CPAP with recent hospitalization due to encephalopathy, acute renal failure and seizure subsequent discharge to SNF who presented to [facility] ED on 12/7 via EMS for altered mental status.  Patient was obtunded, encephalopathic and history obtained from records, consultants and staff. On arrival code stroke was initiated.  Apparently last known well approximately 4 PM this afternoon.  At baseline usually oriented x 2, ambulatory with a walker/cane.  Patient was transported the ED for further evaluation and management per EMS.  In the ED, temperature 98.4 F, HR 80, RR 22, BP 146/68, SpO2 94% on room air.  WBC 7.6, hemoglobin 10.3, platelet count 251.  Sodium 142, potassium 3.4, chloride 106, CO2 27, BUN 16, creat 1.30, glucose 108.  AST 19, ALT 14, total bilirubin 0.5.  INR 1.1.  EtOH level less than 10.  CT head >>no hemorrhage,postsurgical encephalomalacia in b/l medial frontal lobes CT angiogram head/neck >> no emergent LVO, moderate stenosis left V4 segment. Neurology was consulted.  She was deemed high  risk for TNK and received Ativan for concern of seizures.  She was loaded with Keppra. TRH consulted for admission and patient was transferred ED to ED to [facility] for LTM EEG and neurology evaluation.  S/P extensive workup with CT head CT angio, EEG continuous, due to concern for nonconvulsive seizure.patient is continued on Keppra,lamotrigine and if continues to have episodes of unresponsiveness and altered mental status could consider increasing Keppra in the future.At baseline she is confused does become combative at times. Neurology signed off Seen by palliative care for GOC-multiple meetings were held and discussion held regarding goals of care, with her encephalopathy and agitation and poor oral intake after further discussion family decided to transition to comfort care on 12 /13/24 [facility] has accepted patient and after discussion with the son being discharged to [facility] 12/17.   Discharge Diagnoses:  Principal Problem:   AMS (altered mental status) Active Problems:   Seizure (HCC)   Acquired hypothyroidism   Type 2 diabetes mellitus with hyperglycemia, with long-term current use of insulin (HCC)   Diabetic polyneuropathy associated with type 2 diabetes mellitus (HCC)   Benign meningioma of brain (HCC)   CHF (congestive heart failure) (HCC)   Anemia in chronic kidney disease (CKD)   CKD (chronic kidney disease) stage 3, GFR 30-59 ml/min (HCC)   Chronic obstructive pulmonary disease (HCC)   Acute metabolic encephalopathy  End-of-life care: Patient has been transitioned to comfort measures 02/23/23, looking for facility with hospice services. TOC palliative care  following and having discussion with the patient's son an niece   Acute metabolic encephalopathy Non-convulsive seizure Concern for acute CVA-ruled out Baseline confusion: Presenting from SNF with mental status changes agitation, seen by neurology underwent extensive workup with CT head, CT angio head  and neck unrevealing, concern for breakthrough seizures, had continuous EEG. Managed with Ativan and loaded with Keppra,EEG no seizure, neurology signed off. Had been on Keppra and Lamictal   [id number] antibiotics 12/11   Hypokalemia Essential hypertension Hyperlipidemia DM2-PTA on semglee 10 unit COPD Hypothyroidism Chronic anemia Anxiety/depression   Weakness/debility/deconditioning/gait disturbance Adult failure to thrive: Per niece, patient has been slowly declining since rotator cuff surgery earlier this year.  Was initially at acute rehab and recently transition to long-term care given lack of progress.Despite aggressive attempts, patient continues to further decline, with poor oral intake, agitation.  Subsequent meeting with palliative care and transitioned to comfort measures   Class I Obesity:Body mass index is 31.17 kg/m.   Consults: Neurology, palliative care Subjective: Able to open eyes mumbling words.  Earlier agitated needing meds. has not eaten in several days  Discharge Exam: Vitals:   02/26/23 1141 02/26/23 2009  BP: (!) 151/71 (!) 171/73  Pulse: 91 84  Resp: 18 18  Temp: 98.1 F (36.7 C) 97.6 F (36.4 C)  SpO2: 100% 98%   General: Pt is lethargic  Cardiovascular: RRR, S1/S2 +, no rubs, no gallops Respiratory: CTA bilaterally, no wheezing, no rhonchi Abdominal: Soft, NT, ND, bowel sounds + Extremities: no edema, no cyanosis  Discharge Instructions  Discharge Instructions     Diet - low sodium heart healthy   Complete by: As directed    Discharge instructions   Complete by: As directed    Please call call MD or return to ER for similar or worsening recurring problem that brought you to hospital or if any fever,nausea/vomiting,abdominal pain, uncontrolled pain, chest pain,  shortness of breath or any other alarming symptoms.  Please follow-up your doctor as instructed in a week time and call the office for appointment.  Please avoid alcohol,  smoking, or any other illicit substance and maintain healthy habits including taking your regular medications as prescribed.  You were cared for by a hospitalist during your hospital stay. If you have any questions about your discharge medications or the care you received while you were in the hospital after you are discharged, you can call the unit and ask to speak with the hospitalist on call if the hospitalist that took care of you is not available.  Once you are discharged, your primary care physician will handle any further medical issues. Please note that NO REFILLS for any discharge medications will be authorized once you are discharged, as it is imperative that you return to your primary care physician (or establish a relationship with a primary care physician if you do not have one) for your aftercare needs so that they can reassess your need for medications and monitor your lab values   Increase activity slowly   Complete by: As directed       Allergies as of 02/27/2023       Reactions   Iron Itching, Nausea And Vomiting   Milk (cow) Rash, Other (See Comments)   Doesn't agree with the stomach, also   Penicillins Hives   Phenazopyridine Hives   Cephalexin Hives   Flonase [fluticasone] Other (See Comments)   "It gave me ulcers in my nose"   Milk-related Compounds Nausea Only, Other (See Comments)  Doesn't agree with the stomach        Medication List     STOP taking these medications    Accu-Chek Guide Control Liqd   Accu-Chek Guide test strip Generic drug: glucose blood   Adjustable Arm Sling Misc   alendronate 70 MG tablet Commonly known as: FOSAMAX   amLODipine 5 MG tablet Commonly known as: NORVASC   blood glucose meter kit and supplies   carvedilol 12.5 MG tablet Commonly known as: COREG   Cholecalciferol 10 MCG (400 UNIT) Chew   Droplet Pen Needles 31G X 8 MM Misc Generic drug: Insulin Pen Needle   DSS 100 MG Caps   ezetimibe 10 MG  tablet Commonly known as: ZETIA   FreeStyle Libre 14 Day Sensor Misc   insulin aspart 100 UNIT/ML injection Commonly known as: novoLOG   insulin glargine-yfgn 100 UNIT/ML injection Commonly known as: SEMGLEE   Mattress Pad Misc   melatonin 3 MG Tabs tablet   mirabegron ER 50 MG Tb24 tablet Commonly known as: MYRBETRIQ   MULTIVITAMIN GUMMIES ADULT PO   NovoLOG FlexPen 100 UNIT/ML FlexPen Generic drug: insulin aspart   oxybutynin 10 MG 24 hr tablet Commonly known as: DITROPAN-XL   polyethylene glycol 17 g packet Commonly known as: MIRALAX / GLYCOLAX   pregabalin 75 MG capsule Commonly known as: LYRICA   RABEprazole 20 MG tablet Commonly known as: ACIPHEX   senna-docusate 8.6-50 MG tablet Commonly known as: Senokot-S   Toujeo Max SoloStar 300 UNIT/ML Solostar Pen Generic drug: insulin glargine (2 Unit Dial)   vitamin E 180 MG (400 UNITS) capsule       TAKE these medications    acetaminophen 500 MG tablet Commonly known as: TYLENOL Take 1,000 mg by mouth every 8 (eight) hours as needed for mild pain (pain score 1-3), moderate pain (pain score 4-6) or headache.   aspirin EC 81 MG tablet Take 1 tablet (81 mg total) by mouth daily with breakfast.   busPIRone 5 MG tablet Commonly known as: BUSPAR Take 5 mg by mouth 3 (three) times daily.   ipratropium-albuterol 0.5-2.5 (3) MG/3ML Soln Commonly known as: DUONEB Take 3 mLs by nebulization every 6 (six) hours as needed.   ketoconazole 2 % shampoo Commonly known as: NIZORAL Apply 1 Application topically 2 (two) times a week. What changed: when to take this   lamoTRIgine 150 MG tablet Commonly known as: LAMICTAL Take 1 tablet (150 mg total) by mouth 2 (two) times daily.   levETIRAcetam 500 MG tablet Commonly known as: Keppra Take 1 tablet (500 mg total) by mouth 2 (two) times daily.   levothyroxine 50 MCG tablet Commonly known as: SYNTHROID TAKE 1 TABLET DAILY SIX DAYS A WEEK AND 1/2 TABLET ON ONE DAY  A WEEK What changed: See the new instructions.   LORazepam 0.5 MG tablet Commonly known as: ATIVAN Take 0.5 mg by mouth 2 (two) times daily.   montelukast 10 MG tablet Commonly known as: SINGULAIR Take 10 mg by mouth daily.   omeprazole 20 MG capsule Commonly known as: PRILOSEC Take 20 mg by mouth daily.   ondansetron 4 MG tablet Commonly known as: Zofran Take one tablet by mouth once daily, as needed , for nausea   Restasis 0.05 % ophthalmic emulsion Generic drug: cycloSPORINE Place 2 drops into both eyes daily.   rosuvastatin 5 MG tablet Commonly known as: CRESTOR TAKE 1 TABLET AT BEDTIME   sertraline 100 MG tablet Commonly known as: ZOLOFT Take 1 tablet (100 mg total)  by mouth every morning.   UNABLE TO FIND Med Name:  G5 Mattress        Allergies  Allergen Reactions   Iron Itching and Nausea And Vomiting   Milk (Cow) Rash and Other (See Comments)    Doesn't agree with the stomach, also    Penicillins Hives   Phenazopyridine Hives   Cephalexin Hives   Flonase [Fluticasone] Other (See Comments)    "It gave me ulcers in my nose"   Milk-Related Compounds Nausea Only and Other (See Comments)    Doesn't agree with the stomach    The results of significant diagnostics from this hospitalization (including imaging, microbiology, ancillary and laboratory) are listed below for reference.    Microbiology: No results found for this or any previous visit (from the past 240 hours).  Procedures/Studies: MR BRAIN WO CONTRAST Result Date: 02/19/2023 CLINICAL DATA:  Provided history: Neuro deficit, acute, stroke suspected. CVA. EXAM: MRI HEAD WITHOUT CONTRAST TECHNIQUE: Multiplanar, multiecho pulse sequences of the brain and surrounding structures were obtained without intravenous contrast. COMPARISON:  Non-contrast head CT and CT angiogram head/neck 02/17/2023. Brain MRI 01/14/2023. Brain MRI 05/19/2022. FINDINGS: Brain: Post-operative changes from prior bifrontal  craniotomy for meningioma resection. A residual 10 x 7 mm meningioma along the planum sphenoidale was better delineated on the prior contrast-enhanced brain MRI of 05/19/2022, but appears unchanged in size. Encephalomalacia/gliosis again noted within the anterior frontal lobes bilaterally. Chronic blood products also again noted within/along the anterior frontal lobes bilaterally. Background multifocal T2 FLAIR hyperintense signal abnormality within the cerebral white matter (mild) and pons (moderate), nonspecific but compatible with chronic small vessel ischemic disease. Unchanged chronic microhemorrhage within the mid right frontal lobe. There is no acute infarct No extra-axial fluid collection. No midline shift. Vascular: Maintained flow voids within the proximal large arterial vessels. Skull and upper cervical spine: Bifrontal cranioplasty. No focal worrisome marrow lesion. Incompletely assessed cervical spondylosis. Sinuses/Orbits: No mass or acute finding within the imaged orbits. Prior but ocular lens replacement. Trace mucosal thickening within the bilateral ethmoid and maxillary sinuses. IMPRESSION: 1. No evidence of an acute infarct. 2. Post-surgical changes from prior bifrontal craniotomy and meningioma resection, as described. A small residual meningioma along the planum sphenoidale was better delineated on the prior contrast-enhanced brain MRI 05/19/2022, but appears unchanged in size. 3. Background chronic small vessel ischemic changes within the cerebral white matter (mild) and within the pons (moderate). Electronically Signed   By: Jackey Loge D.O.   On: 02/19/2023 09:20   Overnight EEG with video Result Date: 02/19/2023 Charlsie Quest, MD     02/20/2023  9:39 AM Patient Name: MALCOLM AGRO MRN: [id number] Epilepsy Attending: Charlsie Quest Referring Physician/Provider: Erick Blinks, MD Duration: 02/18/2023 0758 to 02/19/2023 1312 Patient history: 72yo F with h/o epilepsy now with ams  getting eeg to evaluate for seizure Level of alertness: Awake, asleep AEDs during EEG study: LEV Technical aspects: This EEG study was done with scalp electrodes positioned according to the 10-20 International system of electrode placement. Electrical activity was reviewed with band pass filter of 1-70Hz , sensitivity of 7 uV/mm, display speed of 93mm/sec with a 60Hz  notched filter applied as appropriate. EEG data were recorded continuously and digitally stored.  Video monitoring was available and reviewed as appropriate. Description: The posterior dominant rhythm consists of 9-10 Hz activity of moderate voltage (25-35 uV) seen predominantly in posterior head regions, symmetric and reactive to eye opening and eye closing. Sleep was characterized by vertex waves, sleep spindles (  12 to 14 Hz), maximal frontocentral region.  Hyperventilation and photic stimulation were not performed.  EEG was disconnected between 02/18/2023 1459 to 02/19/2023 1036 IMPRESSION: This study is within normal limits. No seizures or epileptiform discharges were seen throughout the recording. A normal interictal EEG does not exclude the diagnosis of epilepsy. Priyanka Annabelle Harman   CT ANGIO HEAD NECK W WO CM (CODE STROKE) Result Date: 02/17/2023 CLINICAL DATA:  Unresponsive EXAM: CT ANGIOGRAPHY HEAD AND NECK WITH AND WITHOUT CONTRAST TECHNIQUE: Multidetector CT imaging of the head and neck was performed using the standard protocol during bolus administration of intravenous contrast. Multiplanar CT image reconstructions and MIPs were obtained to evaluate the vascular anatomy. Carotid stenosis measurements (when applicable) are obtained utilizing NASCET criteria, using the distal internal carotid diameter as the denominator. RADIATION DOSE REDUCTION: This exam was performed according to the departmental dose-optimization program which includes automated exposure control, adjustment of the mA and/or kV according to patient size and/or use of  iterative reconstruction technique. CONTRAST:  60mL OMNIPAQUE IOHEXOL 350 MG/ML SOLN COMPARISON:  None Available. FINDINGS: CTA NECK FINDINGS SKELETON: No acute abnormality or high grade bony spinal canal stenosis. OTHER NECK: Heterogeneous multinodular thyroid gland. UPPER CHEST: No pneumothorax or pleural effusion. No nodules or masses. AORTIC ARCH: There is calcific atherosclerosis of the aortic arch. Aberrant right subclavian artery. RIGHT CAROTID SYSTEM: Normal without aneurysm, dissection or stenosis. LEFT CAROTID SYSTEM: No dissection, occlusion or aneurysm. Mild atherosclerotic calcification at the carotid bifurcation without hemodynamically significant stenosis. VERTEBRAL ARTERIES: Left dominant configuration. Normal CTA HEAD FINDINGS POSTERIOR CIRCULATION: Mild atherosclerotic calcification of the left V4 segment with moderate stenosis. No proximal occlusion of the anterior or inferior cerebellar arteries. Basilar artery is normal. Superior cerebellar arteries are normal. Posterior cerebral arteries are normal. ANTERIOR CIRCULATION: Atherosclerotic calcification of the internal carotid arteries at the skull base without hemodynamically significant stenosis. Anterior cerebral arteries are normal. Middle cerebral arteries are normal. VENOUS SINUSES: As permitted by contrast timing, patent. ANATOMIC VARIANTS: Fetal origin of the right posterior cerebral artery. Review of the MIP images confirms the above findings. IMPRESSION: 1. No emergent large vessel occlusion. 2. Moderate stenosis of the left V4 segment. Aortic Atherosclerosis (ICD10-I70.0). Electronically Signed   By: Deatra Robinson M.D.   On: 02/17/2023 19:36   CT HEAD CODE STROKE WO CONTRAST Result Date: 02/17/2023 CLINICAL DATA:  Code stroke.  Altered mental status EXAM: CT HEAD WITHOUT CONTRAST TECHNIQUE: Contiguous axial images were obtained from the base of the skull through the vertex without intravenous contrast. RADIATION DOSE REDUCTION: This  exam was performed according to the departmental dose-optimization program which includes automated exposure control, adjustment of the mA and/or kV according to patient size and/or use of iterative reconstruction technique. COMPARISON:  CT Head 02/11/23 FINDINGS: Brain: Postsurgical encephalomalacia along the medial frontal lobes bilaterally. No hemorrhage. No hydrocephalus. No extra-axial fluid collection. No CT evidence of an acute cortical infarct. No mass effect. No mass lesion. Vascular: No hyperdense vessel or unexpected calcification. Skull: Postsurgical changes from bifrontal craniotomy. Sinuses/Orbits: No middle ear or mastoid effusion. Paranasal sinuses are grossly clear. Bilateral lens replacement. Orbits are otherwise unremarkable. Other: None. ASPECTS ([place] Stroke Program Early CT Score): 10 IMPRESSION: 1. No hemorrhage or CT evidence of an acute cortical infarct. 2. Postsurgical encephalomalacia along the medial frontal lobes bilaterally. Findings were discussed with Dr. Estell Harpin on 02/17/23 at 6:55 PM. Electronically Signed   By: Lorenza Cambridge M.D.   On: 02/17/2023 18:56   CT Cervical Spine Wo Contrast  Result Date: 02/11/2023 CLINICAL DATA:  Fall while transferring to wheelchair. Patient struck her head. EXAM: CT CERVICAL SPINE WITHOUT CONTRAST TECHNIQUE: Multidetector CT imaging of the cervical spine was performed without intravenous contrast. Multiplanar CT image reconstructions were also generated. RADIATION DOSE REDUCTION: This exam was performed according to the departmental dose-optimization program which includes automated exposure control, adjustment of the mA and/or kV according to patient size and/or use of iterative reconstruction technique. COMPARISON:  CT head without con scratched at CT cervical spine 02/03/2023. FINDINGS: Alignment: Slight degenerative anterolisthesis at C4-5 is stable. Mild straightening of the normal cervical lordosis is stable. Skull base and vertebrae:  Craniocervical junction is normal. The vertebral body heights are normal. No acute or healing fractures are present. Soft tissues and spinal canal: No prevertebral fluid or swelling. No visible canal hematoma. Calcifications are present at carotid bifurcations bilaterally without definite stenosis ease. Disc levels: Mild foraminal narrowing is present bilaterally at C5-6 and C6-7 secondary to uncovertebral and facet disease. Moderate left foraminal stenosis is present at C4-5 secondary to facet spurring. Upper chest: The lung apices are clear. The thoracic inlet is within normal limits. No pneumothorax is present. IMPRESSION: 1. No acute or healing fractures of the cervical spine. 2. Stable degenerative anterolisthesis at C4-5. 3. Mild foraminal narrowing bilaterally at C5-6 and C6-7 secondary to uncovertebral and facet disease. 4. Moderate left foraminal stenosis at C4-5 secondary to facet spurring. Electronically Signed   By: Marin Roberts M.D.   On: 02/11/2023 16:52   CT Head Wo Contrast Result Date: 02/11/2023 CLINICAL DATA:  Trauma. Hit head attempting to transfer to wheelchair. EXAM: CT HEAD WITHOUT CONTRAST TECHNIQUE: Contiguous axial images were obtained from the base of the skull through the vertex without intravenous contrast. RADIATION DOSE REDUCTION: This exam was performed according to the departmental dose-optimization program which includes automated exposure control, adjustment of the mA and/or kV according to patient size and/or use of iterative reconstruction technique. COMPARISON:  CT head without contrast 02/03/2023. MR head without contrast 01/14/2023. FINDINGS: Brain: Chronic bilateral frontal lobe encephalomalacia is stable. No acute infarct, hemorrhage, or mass lesion is present. The ventricles are proportionate to the degree of atrophy. Deep brain nuclei are within normal limits. No significant extraaxial fluid collection is present. The brainstem and cerebellum are within normal  limits. Midline structures are within normal limits. Vascular: Atherosclerotic calcifications are present within the cavernous internal carotid arteries and at the dural margin of both vertebral arteries. No hyperdense vessel is present. Skull: Left paramedian parietal scalp soft tissue swelling is present at the vertex. No underlying fracture is present. No other significant extracranial soft tissue injury is present. Bifrontal craniotomies are again noted. Sinuses/Orbits: The paranasal sinuses and mastoid air cells are clear. Bilateral lens replacements are noted. Globes and orbits are otherwise unremarkable. IMPRESSION: 1. Left paramedian parietal scalp soft tissue swelling at the vertex without underlying fracture. 2. No acute intracranial abnormality or significant interval change. 3. Stable chronic bilateral frontal lobe encephalomalacia. Electronically Signed   By: Marin Roberts M.D.   On: 02/11/2023 16:49   DG Pelvis Portable Result Date: 02/03/2023 CLINICAL DATA:  Fall EXAM: PORTABLE PELVIS 1-2 VIEWS COMPARISON:  June 27, 2017. FINDINGS: No evidence of pelvic fracture or diastasis on single AP view radiograph. Sacrum is obscured by overlapping bowel contents. Soft tissues are unremarkable. Degenerative changes of the lower lumbar spine. IMPRESSION: No evidence of pelvic fracture or diastasis on single AP view radiograph. If persistent clinical concern, recommend dedicated cross-sectional imaging. Electronically  Signed   By: Meda Klinefelter M.D.   On: 02/03/2023 16:26   DG Chest Portable 1 View Result Date: 02/03/2023 CLINICAL DATA:  fall, EXAM: PORTABLE CHEST 1 VIEW COMPARISON:  January 15, 2023 FINDINGS: The cardiomediastinal silhouette is unchanged and enlarged in contour. No pleural effusion. No pneumothorax. No acute pleuroparenchymal abnormality. Status post LEFT shoulder arthroplasty. Remote anterior RIGHT rib fractures. IMPRESSION: No acute cardiopulmonary abnormality.  Electronically Signed   By: Meda Klinefelter M.D.   On: 02/03/2023 16:25   CT HEAD WO CONTRAST ( ) Result Date: 02/03/2023 CLINICAL DATA:  Fall from wheelchair, trauma EXAM: CT HEAD WITHOUT CONTRAST CT MAXILLOFACIAL WITHOUT CONTRAST CT CERVICAL SPINE WITHOUT CONTRAST TECHNIQUE: Multidetector CT imaging of the head, cervical spine, and maxillofacial structures were performed using the standard protocol without intravenous contrast. Multiplanar CT image reconstructions of the cervical spine and maxillofacial structures were also generated. RADIATION DOSE REDUCTION: This exam was performed according to the departmental dose-optimization program which includes automated exposure control, adjustment of the mA and/or kV according to patient size and/or use of iterative reconstruction technique. COMPARISON:  01/12/2023 FINDINGS: CT HEAD FINDINGS Brain: No evidence of acute infarction, hemorrhage, hydrocephalus, extra-axial collection or mass lesion/mass effect. Unchanged encephalomalacia of the frontal poles (series 3, image 16). Vascular: No hyperdense vessel or unexpected calcification. CT FACIAL BONES FINDINGS Skull: Status post frontal craniotomy. Hyperostosis frontalis. Negative for fracture or focal lesion. Facial bones: No displaced fractures or dislocations. Sinuses/Orbits: No acute finding. Other: Patient is edentulous. CT CERVICAL SPINE FINDINGS Alignment: Normal. Skull base and vertebrae: No acute fracture. No primary bone lesion or focal pathologic process. Soft tissues and spinal canal: No prevertebral fluid or swelling. No visible canal hematoma. Disc levels: Moderate multilevel cervical disc degenerative disease, worst from C5-C7. Upper chest: Negative. Other: None. IMPRESSION: 1. No acute intracranial pathology. Unchanged encephalomalacia of the frontal poles. Status post frontal craniotomy. 2. No displaced fractures or dislocations of the facial bones. 3. No fracture or static subluxation of the  cervical spine. 4. Moderate multilevel cervical disc degenerative disease, worst from C5-C7. Electronically Signed   By: Jearld Lesch M.D.   On: 02/03/2023 16:16   CT CERVICAL SPINE WO CONTRAST Result Date: 02/03/2023 CLINICAL DATA:  Fall from wheelchair, trauma EXAM: CT HEAD WITHOUT CONTRAST CT MAXILLOFACIAL WITHOUT CONTRAST CT CERVICAL SPINE WITHOUT CONTRAST TECHNIQUE: Multidetector CT imaging of the head, cervical spine, and maxillofacial structures were performed using the standard protocol without intravenous contrast. Multiplanar CT image reconstructions of the cervical spine and maxillofacial structures were also generated. RADIATION DOSE REDUCTION: This exam was performed according to the departmental dose-optimization program which includes automated exposure control, adjustment of the mA and/or kV according to patient size and/or use of iterative reconstruction technique. COMPARISON:  01/12/2023 FINDINGS: CT HEAD FINDINGS Brain: No evidence of acute infarction, hemorrhage, hydrocephalus, extra-axial collection or mass lesion/mass effect. Unchanged encephalomalacia of the frontal poles (series 3, image 16). Vascular: No hyperdense vessel or unexpected calcification. CT FACIAL BONES FINDINGS Skull: Status post frontal craniotomy. Hyperostosis frontalis. Negative for fracture or focal lesion. Facial bones: No displaced fractures or dislocations. Sinuses/Orbits: No acute finding. Other: Patient is edentulous. CT CERVICAL SPINE FINDINGS Alignment: Normal. Skull base and vertebrae: No acute fracture. No primary bone lesion or focal pathologic process. Soft tissues and spinal canal: No prevertebral fluid or swelling. No visible canal hematoma. Disc levels: Moderate multilevel cervical disc degenerative disease, worst from C5-C7. Upper chest: Negative. Other: None. IMPRESSION: 1. No acute intracranial pathology. Unchanged encephalomalacia of the frontal  poles. Status post frontal craniotomy. 2. No displaced  fractures or dislocations of the facial bones. 3. No fracture or static subluxation of the cervical spine. 4. Moderate multilevel cervical disc degenerative disease, worst from C5-C7. Electronically Signed   By: Jearld Lesch M.D.   On: 02/03/2023 16:16   CT Maxillofacial Wo Contrast Result Date: 02/03/2023 CLINICAL DATA:  Fall from wheelchair, trauma EXAM: CT HEAD WITHOUT CONTRAST CT MAXILLOFACIAL WITHOUT CONTRAST CT CERVICAL SPINE WITHOUT CONTRAST TECHNIQUE: Multidetector CT imaging of the head, cervical spine, and maxillofacial structures were performed using the standard protocol without intravenous contrast. Multiplanar CT image reconstructions of the cervical spine and maxillofacial structures were also generated. RADIATION DOSE REDUCTION: This exam was performed according to the departmental dose-optimization program which includes automated exposure control, adjustment of the mA and/or kV according to patient size and/or use of iterative reconstruction technique. COMPARISON:  01/12/2023 FINDINGS: CT HEAD FINDINGS Brain: No evidence of acute infarction, hemorrhage, hydrocephalus, extra-axial collection or mass lesion/mass effect. Unchanged encephalomalacia of the frontal poles (series 3, image 16). Vascular: No hyperdense vessel or unexpected calcification. CT FACIAL BONES FINDINGS Skull: Status post frontal craniotomy. Hyperostosis frontalis. Negative for fracture or focal lesion. Facial bones: No displaced fractures or dislocations. Sinuses/Orbits: No acute finding. Other: Patient is edentulous. CT CERVICAL SPINE FINDINGS Alignment: Normal. Skull base and vertebrae: No acute fracture. No primary bone lesion or focal pathologic process. Soft tissues and spinal canal: No prevertebral fluid or swelling. No visible canal hematoma. Disc levels: Moderate multilevel cervical disc degenerative disease, worst from C5-C7. Upper chest: Negative. Other: None. IMPRESSION: 1. No acute intracranial pathology.  Unchanged encephalomalacia of the frontal poles. Status post frontal craniotomy. 2. No displaced fractures or dislocations of the facial bones. 3. No fracture or static subluxation of the cervical spine. 4. Moderate multilevel cervical disc degenerative disease, worst from C5-C7. Electronically Signed   By: Jearld Lesch M.D.   On: 02/03/2023 16:16    Labs: BNP (last 3 results) Recent Labs    11/22/22 1233  BNP 180.0*   Basic Metabolic Panel: Recent Labs  Lab 02/21/23 0559 02/22/23 0709 02/23/23 0649  NA 141 139 141  K 3.4* 3.5 3.3*  CL 108 105 105  CO2 24 26 27   GLUCOSE 132* 120* 122*  BUN 10 12 9   CREATININE 0.80 0.89 0.85  CALCIUM 9.1 9.1 9.3  MG 1.9 2.0  --   PHOS 2.6  --   --    Liver Function Tests: Recent Labs  Lab 02/21/23 0559  AST 17  ALT 12  ALKPHOS 63  BILITOT 0.7  PROT 6.6  ALBUMIN 3.3*   No results for input(s): "LIPASE", "AMYLASE" in the last 168 hours. No results for input(s): "AMMONIA" in the last 168 hours. CBC: Recent Labs  Lab 02/21/23 0559 02/22/23 0709  WBC 7.6 6.1  HGB 9.3* 9.7*  HCT 32.3* 33.1*  MCV 75.1* 74.7*  PLT 262 287      Component Value Date/Time   COLORURINE YELLOW 02/18/2023 1731   APPEARANCEUR CLEAR 02/18/2023 1731   APPEARANCEUR Clear 12/18/2022 1042   LABSPEC 1.016 02/18/2023 1731   PHURINE 6.0 02/18/2023 1731   GLUCOSEU NEGATIVE 02/18/2023 1731   HGBUR SMALL (A) 02/18/2023 1731   HGBUR negative 03/29/2010 0834   BILIRUBINUR NEGATIVE 02/18/2023 1731   BILIRUBINUR Negative 12/18/2022 1042   KETONESUR 20 (A) 02/18/2023 1731   PROTEINUR 30 (A) 02/18/2023 1731   UROBILINOGEN 0.2 08/31/2021 1434   UROBILINOGEN 0.2 06/02/2014 0140   NITRITE NEGATIVE  02/18/2023 1731   LEUKOCYTESUR MODERATE (A) 02/18/2023 1731   Sepsis Labs Recent Labs  Lab 02/21/23 0559 02/22/23 0709  WBC 7.6 6.1   Microbiology No results found for this or any previous visit (from the past 240 hours).  Time coordinating discharge: 25  minutes  SIGNED: Lanae Boast, MD  [facility] 02/27/2023, 10:37 AM  If 7PM-7AM, please contact night-coverage [email or web address]

## 2023-02-27 NOTE — Progress Notes (Signed)
[facility] [phone] [facility] Liaison Note  Received request from [facility] manager for family interest in [facility]. Eligibility confirmed. Met with patient and family to confirm interest and explain services. Family agreeable to transfer today. TOC aware. RN please call report to [phone] prior to patient leaving the unit. Please send signed DNR with patient at discharge.   Thank you for allowing [name] to participate in this patient's care.  Henderson Newcomer, LPN [facility] Liaison [phone]

## 2023-02-27 NOTE — Plan of Care (Signed)
  Problem: Self-Care: Goal: Ability to participate in self-care as condition permits will improve Outcome: Progressing   Problem: Nutrition: Goal: Dietary intake will improve Outcome: Progressing   Problem: Activity: Goal: Risk for activity intolerance will decrease Outcome: Progressing   Problem: Safety: Goal: Ability to remain free from injury will improve Outcome: Progressing

## 2023-02-27 NOTE — TOC Progression Note (Signed)
Transition of Care [facility]) - Progression Note    Patient Details  Name: [name] MRN: [id number] Date of Birth: 07/15/1950  Transition of Care [facility]) CM/SW Contact  Baldemar Lenis, [place] Phone Number: 02/27/2023, 10:47 AM  Clinical Narrative:   CSW coordinated with palliative NP and hospice liaison about weekend updates, then met with niece, Merdis Delay, at bedside to discuss concerns about hospice placement. Per Merdis Delay, the family is feeling concerned about hospice and that they are "giving up" and "playing God" since the patient had a good day on Saturday. CSW relayed information from medical team about poor prognosis since then, and niece indicated understanding. CSW discussed having the medical providers contact them again to provide more details on poor prognosis so the family can better understand. CSW relayed information from discussion to palliative NP and hospice liaison. Bed is still available for the patient if family is agreeable to transfer. CSW to follow.    Expected Discharge Plan: Hospice Medical Facility Barriers to Discharge: Continued Medical Work up  Expected Discharge Plan and Services     Post Acute Care Choice: Skilled Nursing Facility Living arrangements for the past 2 months: Single Family Home Expected Discharge Date: 02/27/23                                     Social Determinants of Health (SDOH) Interventions SDOH Screenings   Food Insecurity: No Food Insecurity (02/18/2023)  Housing: Patient Declined (02/18/2023)  Transportation Needs: No Transportation Needs (02/18/2023)  Utilities: Not At Risk (02/18/2023)  Alcohol Screen: Low Risk  (07/17/2022)  Depression (PHQ2-9): Low Risk  (12/12/2022)  Financial Resource Strain: Low Risk  (07/17/2022)  Physical Activity: Insufficiently Active (07/17/2022)  Social Connections: Moderately Integrated (07/17/2022)  Stress: No Stress Concern Present (07/17/2022)  Tobacco Use: High Risk (02/18/2023)  Health Literacy:  Adequate Health Literacy (12/14/2022)    Readmission Risk Interventions    08/21/2022   11:54 AM 09/16/2021   10:07 AM  Readmission Risk Prevention Plan  Transportation Screening Complete Complete  Medication Review (RN Care Manager) Complete Complete  PCP or Specialist appointment within 3-5 days of discharge Complete   HRI or Home Care Consult Complete Complete  SW Recovery Care/Counseling Consult Complete Complete  Palliative Care Screening Not Applicable Not Applicable  Skilled Nursing Facility Not Applicable Patient Refused

## 2023-02-27 NOTE — Progress Notes (Signed)
   Palliative Medicine Inpatient Follow Up Note HPI: 72 y.o. female with past medical history of DM2, HTN, HLD, chronic smoking, OSA on CPAP, stroke, COPD hypothyroidism, seizure, bipolar disorder, fibromyalgia, arthritis, diverticulosis, GERD, and chronic anemia was admitted on 02/17/2023 with acute metabolic encephalopathy/concern for seizures versus CVA.   Today's Discussion 02/27/2023  *Please note that this is a verbal dictation therefore any spelling or grammatical errors are due to the "Dragon Medical One" system interpretation.  Chart reviewed inclusive of vital signs, progress notes, laboratory results, and diagnostic images. No oral intake.   I met with Camyra's RN, Joy this morning. She shares that Eleasha has been resting and not noted to not be in any distress this morning. She is able to open her eyes and state her name.   Patients son is agreeable to transfer to [facility].   Questions and concerns addressed/Palliative Support Provided.   Objective Assessment: Vital Signs Vitals:   02/26/23 1141 02/26/23 2009  BP: (!) 151/71 (!) 171/73  Pulse: 91 84  Resp: 18 18  Temp: 98.1 F (36.7 C) 97.6 F (36.4 C)  SpO2: 100% 98%   No intake or output data in the 24 hours ending 02/27/23 1129  Last Weight  Most recent update: 02/17/2023  6:37 PM    Weight  70 kg (154 lb 5.2 oz)            Gen:  Chronically ill appearing AA F HEENT: moist mucous membranes CV: Regular rate and rhythm  PULM: On RA, breathing is even and not labored ABD: soft/nontender  EXT: (-) edema  Neuro: Groans, does not follow directions  SUMMARY OF RECOMMENDATIONS   DNAR/DNI  Comfort measures  Medication per MAR --> Continue ativan 1mg  IVP Q8H ATC. Continue haldol 5mg  IVP Q6H PRN.  Unrestricted visitation  Transfer to [employer] this afternoon  Time: 25 - no medication modifications needed.  ______________________________________________________________________________________ Lamarr Lulas [place] Palliative Medicine Team Team Cell Phone: [phone] Please utilize secure chat with additional questions, if there is no response within 30 minutes please call the above phone number  Palliative Medicine Team providers are available by phone from 7am to 7pm daily and can be reached through the team cell phone.  Should this patient require assistance outside of these hours, please call the patient's attending physician.

## 2023-02-27 NOTE — Patient Outreach (Signed)
  Care Coordination   Case closure   Visit Note   02/27/2023 Name: ARIANNI GOSSMAN MRN: [id number] DOB: [birth year]  Weyman Pedro Berenson is a 72 y.o. year old female who sees Lodema Hong, Milus Mallick, MD for primary care. I  received an in basket message from [facility] hospital liaison   What matters to the patients health and wellness today?  Case closure patient in hospice services per [facility] hospital liaison     Goals Addressed             This Visit's Progress    COMPLETED: Reduce risks of falls + RNCM Care Management  (HYPERTENSION)  EXPECTED OUTCOME:  MONITOR,SELF- MANAGE AND REDUCE SYMPTOMS OF HYPERTENSION       Case closure patient in hospice services per [facility] hospital liaison      COMPLETED: RNCM Care Management (DIABETES) EXPECTED OUTCOME: MONITOR, SELF-MANAGE AND REDUCE SYMPTOMS OF DIABETES       Case closure patient in hospice services per [facility] hospital liaison         SDOH assessments and interventions completed:  No     Care Coordination Interventions:  Yes, provided   Follow up plan: No further intervention required.   Encounter Outcome:  Patient Visit Completed   Cala Bradford L. Noelle Penner, RN, BSN, [facility]  [facility] Care Management Coordinator  [phone]  Fax: [phone]

## 2023-02-27 NOTE — Consult Note (Signed)
[facility] Liaison Consult Note    02/27/2023  [name] 1950/04/05 [id number]  Active with [facility] Updates  Follow up:  Hospice Facility   Patient to transition to [facility] and will update [facility] RN CC of disposition.  Will sign off at transition from the hospital.  Charlesetta Shanks, RN, BSN, CCM [name]  [facility] Liaison Direct Dial: [phone] or secure chat Email: [email or web address]       .

## 2023-03-05 ENCOUNTER — Telehealth: Payer: Self-pay | Admitting: Family Medicine

## 2023-03-05 NOTE — Telephone Encounter (Signed)
Copied from CRM [id number]. Topic: General - Deceased Patient >> 2023-03-09 12:56 PM Maxwell Marion wrote: Carley Hammed from [facility] called to let Dr. Lodema Hong know pt was admitted there on Mar 03, 2023 and passed away Mar 07, 2023. Call back number is [phone] if there is any additional info or anything needed

## 2023-03-06 ENCOUNTER — Inpatient Hospital Stay: Payer: Medicare HMO

## 2023-03-06 ENCOUNTER — Other Ambulatory Visit: Payer: Medicare HMO

## 2023-03-08 ENCOUNTER — Ambulatory Visit: Payer: Medicare HMO | Admitting: Internal Medicine

## 2023-03-08 NOTE — Progress Notes (Deleted)
Patient ID: [name], female   DOB: [birth year], 72 y.o.   MRN: [id number]  HPI: [name] is a 72 y.o.-year-old female, initially referred by her PCP, Dr. Lodema Hong, presenting for follow-up for DM2, dx 1995, insulin-dependent since 1997, uncontrolled, with complications (gastroparesis, cerebrovasc. Ds-  H/o stroke, PN),  Hypothyroidism, thyroid nodules.  Last visit 4 months ago.  Interim history: She also continues to have neck pain, headaches, disequilibrium. She has blurry vision but no chest pain, or increased urination. She was admitted 02/17/2023 with AMS.  Previously had several emergency rooms visit for falls.  DM2: Reviewed HbA1c levels: Lab Results  Component Value Date   HGBA1C 5.2 02/18/2023   HGBA1C 5.8 (H) 01/13/2023   HGBA1C 5.3 11/03/2022  10/28/2013: HbA1c 8.5%  At last visit she was on: - Toujeo 20 >> 22 units daily - Novolog -  4 units before each meal  We changed to: - Toujeo 22 units daily - Novolog  5 units for smaller meals  8 units for large meals  If you have a steroid injection, you may need higher doses of NovoLog: 8-12 units for meals.  Could not tolerate Metformin >> nausea.   Insulin doses were decreased in 04/2017 due to low blood sugars in the 40s. Nephrology started Montz but stopped "it did not agree with me".  Pt checks her sugars 4 times a day: - am:  140-170, 200s >> 103-159 >> 100-122 - 2h after b'fast: n/c >> 110 >> n/c - before lunch: 89-192 >> 140-155 >> 134 -150s >> 111-140 - 2h after lunch:  n/c >> 127, 215, 241 >> n/c - before dinner: 120-130 >> 140-155 >> 127-140 >> 140-150 - 2h after dinner: n/c - bedtime: 83-190, 194-414 >> 99-100s >> 200s, 325 - steroids >> n/c - nighttime: n/c >> 134 >> n/c Lowest sugar was 30s (delayed meals, not eating well) >> 103 >> 100; she has hypoglycemia awareness in the 70s. Highest sugar was 542 (steroid inj) ...  300 >> 500 -right after getting out of rehab.  Pt's meals are: - Breakfast:  eggs, cereals, fruit, oatmeal, bacon - Lunch: sandwich, beef, mashed potatoes,diet soda - Dinner: chicken, beef, fish, potatoes, vegetables - Snacks: 1-2: fruit  -+ Mild CKD; last BUN/creatinine:  Lab Results  Component Value Date   BUN 9 02/23/2023   CREATININE 0.85 02/23/2023   Lab Results  Component Value Date   MICRALBCREAT 41 (H) 03/09/2022   MICRALBCREAT 24 08/21/2019   MICRALBCREAT 13.3 02/11/2018   MICRALBCREAT 4.8 10/20/2014   MICRALBCREAT 9.4 08/14/2013   MICRALBCREAT 6.1 06/14/2012   MICRALBCREAT 6.8 05/07/2010   MICRALBCREAT 7.0 04/23/2009   MICRALBCREAT 10.5 04/15/2008  On losartan.  -+ HL; last set of lipids: Lab Results  Component Value Date   CHOL 102 02/18/2023   HDL 41 02/18/2023   LDLCALC 39 02/18/2023   TRIG 110 02/18/2023   CHOLHDL 2.5 02/18/2023  On Crestor and Zetia.  - last eye exam was 05/27/2022: + DR reportedly; Had cataract sx B. Dr. Daphine Deutscher [facility]). + L eye pain.  -She has numbness and tingling mainly in the left leg-affected by stroke. Sees podiatry.  She had foot exam by podiatry (Dr. Ralene Cork) 01/09/2023.  Hypothyroidism: -Diagnosed many years ago  She takes levothyroxine 50 mcg daily: - in am - fasting - at least 30 min from b'fast - no calcium - no iron - + multivitamins with lunch  - + Aciphex -moved later in the day - no PPIs - +  on Biotin and B complex  Reviewed her TFTs: Lab Results  Component Value Date   TSH 0.529 01/13/2023   TSH 0.681 11/22/2022   TSH 1.060 06/29/2022   TSH 0.494 10/04/2021   TSH 0.710 09/16/2021   Thyroid nodules.    Pt denies: - feeling nodules in neck - hoarseness But she continues to have chronic dysphagia with certain foods.  She had previous esophageal dilations. She also has choking.  I reviewed previous imaging and biopsy tests: FNA (2014) x2: Benign  thyroid U/S (10/2013): Right thyroid lobe: 3.7 x 1.0 x 1.8 cm  Left thyroid lobe: 3.5 x 1.5 x 1.6 cm  Isthmus: 0.5 cm  Focal  nodules:  There is a 0.3 mm calcified nodule in the right midzone.  There is a 0.6 x 0.7 x 0.5 cm hypoechoic nodule in the  lateral aspect of the right midzone.  There is a 0.7 x 0.6 x 0.5 cm hyperechoic nodule in the lateral aspect of the right lower pole.  There is a 2.2 x 1.1 x 1.7 cm complex solid nodule in the inferior aspect of the isthmus extending adjacent to the left lower pole.  There is a 1.4 x 2.1 x 1.4 cm complex solid nodule in the left lower pole.  Lymphadenopathy: None visualized.  Repeat U/S (01/2014): Stable appearance of the nodules  Repeat U/S (10/2015): stable appearance of the nodules  Repeat U/S (10/2016): Stable appearance of the nodules  Repeat U/S (02/02/2021): Stable appearance of nodules  She had a L rib fracture in 12/2014. She had a R ankle fracture 02/20/2020 >> bone stimulator to help with healing. Patient was admitted with AMS + sepsis on 10/15/2016.  She had aspiration pneumonia and acute respiratory failure She has a recurrent meningioma >> seeing Dr. Jule Ser. She was admitted 09/15/2021 after a fall, and found to have dehydration, AKI and hyperkalemia (6.2) -considered the reason for her weakness and subsequent fall.   She is on Fosamax. She has a h/o pancreatitis.  She had shoulder replacement in 08/2022. The pain is improving. She became delirious after the surgery >> was in rehab for ~1 mo.  ROS: + see HPI  I reviewed pt's medications, allergies, PMH, social hx, family hx, and changes were documented in the history of present illness. Otherwise, unchanged from my initial visit note.  Past Medical History:  Diagnosis Date   Allergy    Anemia    Anemia in chronic kidney disease (CKD) 08/10/2021   Anemia in chronic kidney disease (CKD) 10/12/2021   Anxiety    takes Ativan daily   Arthritis    Assistance needed for mobility    Bipolar disorder (HCC)    takes Risperdal nightly   Blood transfusion    Brain tumor (HCC)    Cancer (HCC)    In  her gum   Carpal tunnel syndrome of right wrist 05/23/2011   Cervical disc disorder with radiculopathy of cervical region 10/31/2012   Chronic back pain    Chronic idiopathic constipation    Chronic neck and back pain    Colon polyps    COPD (chronic obstructive pulmonary disease) with chronic bronchitis (HCC) 09/16/2013   Office Spirometry 10/30/2013-submaximal effort based on appearance of loop and curve. Numbers would fit with severe restriction but her physiologic capability may be better than this. FVC 0.91/44%, and 10.74/45%, FEV1/FVC 0.81, FEF 25-75% 1.43/69%     Diabetes mellitus    Type II   Diverticulosis    TCS 9/08 by  Dr. Lina Sar for diarrhea . Bx for micro scopic colitis negative.    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   Metabolic encephalopathy 08/03/2011   Migraines    chronic headaches   Mononeuritis lower limb    Narcolepsy    Osteoporosis    Pancreatitis 2006   due to Depakote with normal EUS    Paralysis (HCC)    Pneumonia    Schatzki's ring    non critical / EGD with ED 8/2011with RMR   Seizures (HCC)    takes Lamictal daily.Last seizure 3 yrs ago   Sleep apnea    on CPAP   Small bowel obstruction (HCC)    Stroke (HCC)    left sided weakness, speech changes   Tubular adenoma of colon    Past Surgical History:  Procedure Laterality Date   ABDOMINAL HYSTERECTOMY  1978   BACK SURGERY  July 2012   BACTERIAL OVERGROWTH TEST N/A 05/05/2013   Procedure: BACTERIAL  OVERGROWTH TEST;  Surgeon: Corbin Ade, MD;  Location: [facility];  Service: Endoscopy;  Laterality: N/A;  7:30   BIOPSY THYROID  2009   BRAIN SURGERY  11/2011   resection of meningioma   BREAST REDUCTION SURGERY  1994   CARDIAC CATHETERIZATION  05/10/2005   normal coronaries, normal LV systolic function and EF (Dr. Evlyn Courier)   CARPAL TUNNEL RELEASE Left 07/22/04   Dr. Romeo Apple   CATARACT EXTRACTION Bilateral    CHOLECYSTECTOMY  1984   COLONOSCOPY N/A 09/25/2012   [id number] diverticulosis.  colonic polyp-removed : tubular adenoma   CRANIOTOMY  11/23/2011   Procedure: CRANIOTOMY TUMOR EXCISION;  Surgeon: Hewitt Shorts, MD;  Location: [facility];  Service: Neurosurgery;  Laterality: N/A;  Craniotomy for tumor resection   ESOPHAGOGASTRODUODENOSCOPY  12/29/2010   Rourk-Retained food in the esophagus and stomach, small hiatal hernia, status post Maloney dilation of the esophagus   ESOPHAGOGASTRODUODENOSCOPY N/A 09/25/2012   FAO:[id number] atonic baggy esophagus status post Maloney dilation 56 F. Hiatal hernia   GIVENS CAPSULE STUDY N/A 01/15/2013   NORMAL.    IR GENERIC HISTORICAL  03/17/2016   IR RADIOLOGIST EVAL & MGMT 03/17/2016 [facility]   LESION REMOVAL N/A 05/31/2015   Procedure: REMOVAL RIGHT AND LEFT LESIONS OF MANDIBLE;  Surgeon: Ocie Doyne, DDS;  Location: [facility] OR;  Service: Oral Surgery;  Laterality: N/A;   MALONEY DILATION  12/29/2010   RMR;   NM MYOCAR PERF WALL MOTION  2006   "relavtiely normal" persantine, mild anterior thinning (breast attenuation artifact), no region of scar/ischemia   OVARIAN CYST REMOVAL     RECTOCELE REPAIR N/A 06/29/2015   Procedure: POSTERIOR REPAIR (RECTOCELE);  Surgeon: Tilda Burrow, MD;  Location: [facility] ORS;  Service: Gynecology;  Laterality: N/A;   REDUCTION MAMMAPLASTY Bilateral    REVERSE SHOULDER ARTHROPLASTY Left 08/18/2022   Procedure: REVERSE SHOULDER ARTHROPLASTY;  Surgeon: Beverely Low, MD;  Location: [facility];  Service: Orthopedics;   Laterality: Left;  choice with interscalene, general   SPINE SURGERY  09/29/2010   Dr. Shon Baton   surgical excision of 3 tumors from right thigh and right buttock  and left upper thigh  2010   TOOTH EXTRACTION Bilateral 12/14/2014   Procedure: REMOVAL OF BILATERAL MANDIBULAR EXOSTOSES;  Surgeon: Ocie Doyne, DDS;  Location: [facility] OR;  Service: Oral Surgery;  Laterality: Bilateral;   TRANSTHORACIC ECHOCARDIOGRAM  2010   EF 60-65%, mild conc LVH, grade 1 diastolic dysfunction; mildly calcified MV annulus with mildly thickened leaflets, mildly calcified MR annulus   History   Social History   Marital Status: Divorced    Spouse Name: N/A    Number of Children: 1   Years of Education: 12   Occupational History   disabled     Social History Main Topics   Smoking status: Current Every Day Smoker -- 0.25 packs/day for 7 years    Types: Cigarettes   Smokeless tobacco: Never Used     Comment: "started back but off now for 3 months" (08/18/13)   Alcohol Use: No     Comment:     Drug Use: No   Current Outpatient Medications on File Prior to Visit  Medication Sig Dispense Refill   acetaminophen (TYLENOL) 500 MG tablet Take 1,000 mg by mouth every 8 (eight) hours as needed for mild pain (pain score 1-3), moderate pain (pain score 4-6) or headache.     aspirin EC 81 MG tablet Take 1 tablet (81 mg total) by mouth daily with breakfast. 120 tablet 2   busPIRone (BUSPAR) 5 MG tablet Take 5 mg by mouth 3 (three) times daily.     ipratropium-albuterol (DUONEB) 0.5-2.5 (3) MG/3ML SOLN Take 3 mLs by nebulization every 6 (six) hours as needed. 360 mL 12   ketoconazole (NIZORAL) 2 % shampoo Apply 1 Application topically 2 (two) times a week. (Patient taking differently: Apply 1 Application topically every 3 (three) days.) 120 mL 0   lamoTRIgine (LAMICTAL) 150 MG tablet Take 1 tablet (150 mg total) by mouth 2 (two) times daily. 60 tablet 0   levETIRAcetam (KEPPRA) 500 MG tablet Take 1 tablet (500 mg total) by  mouth 2 (two) times daily.     levothyroxine (SYNTHROID) 50 MCG tablet TAKE 1 TABLET DAILY SIX DAYS A WEEK AND 1/2 TABLET ON ONE DAY A WEEK (Patient taking differently: Take 25-50 mcg by mouth See admin instructions. Take 1/2 tablet by mouth every Sunday, then take 1 tablet all other days) 85 tablet 2   LORazepam (ATIVAN) 0.5 MG tablet Take 0.5 mg by mouth 2 (two) times daily.     montelukast (SINGULAIR) 10 MG tablet Take 10 mg by mouth daily.     omeprazole (PRILOSEC) 20 MG capsule Take 20 mg by mouth daily.     ondansetron (ZOFRAN) 4 MG tablet Take one tablet by mouth once daily, as needed , for nausea 30 tablet 2   RESTASIS 0.05 % ophthalmic emulsion Place 2 drops into both eyes daily.     rosuvastatin (CRESTOR) 5 MG tablet TAKE 1 TABLET AT BEDTIME 90 tablet 3   sertraline (ZOLOFT) 100 MG tablet Take 1 tablet (100 mg total) by mouth every morning. 30 tablet 0   UNABLE TO FIND Med Name:  G5 Mattress 1 each 0   No current facility-administered medications on file prior to visit.      Allergies  Allergen Reactions   Cephalexin Hives  Iron Nausea And Vomiting   Milk-Related Compounds Other (See Comments)    Doesn't agree with stomach.    Penicillins Hives        Phenazopyridine Hcl Hives         Family History  Problem Relation Age of Onset   Heart attack Mother        HTN   Pneumonia Father    Kidney failure Father    Diabetes Father    Pancreatic cancer Sister    Cancer Sister        breast    Cancer Sister        pancreatic   Diabetes Brother    Hypertension Brother    Diabetes Brother    Alcohol abuse Maternal Uncle    Stroke Maternal Grandmother    Heart attack Maternal Grandfather    Hypertension Son    Sleep apnea Son    Colon cancer Neg Hx    Anesthesia problems Neg Hx    Hypotension Neg Hx    Malignant hyperthermia Neg Hx    Pseudochol deficiency Neg Hx    Breast cancer Neg Hx    Stomach cancer Neg Hx    PE: There were no vitals taken for this visit.    Wt Readings from Last 3 Encounters:  02/17/23 154 lb 5.2 oz (70 kg)  02/11/23 154 lb 5.2 oz (70 kg)  01/12/23 155 lb 3.3 oz (70.4 kg)   Constitutional: overweight, in NAD, walks with a walker.  Difficulty rising from a sitting position without help. Eyes: EOMI, no exophthalmos ENT: no thyromegaly, no cervical lymphadenopathy Cardiovascular: RRR, No MRG Respiratory: CTA B Musculoskeletal: no deformities Skin:  no rashes Neurological: no tremor with outstretched hands  ASSESSMENT: 1. DM2, insulin-dependent, uncontrolled, without complications - gastroparesis - cerebrovasc. Ds -  H/o stroke - PN  She was interested in an insulin pump >> discussed about [employer] (given brochure).  2. Hypothyroidism  3. MNG - Thyroid U/S (10/11/2012): Right thyroid lobe:  3.7 x 1.0 x 1.8 cm Left thyroid lobe:   3.5 x 1.5 x 1.6 cm Isthmus:  0.5 cm Focal nodules:  There is a 0.3 mm calcified nodule in the right midzone.  There is a 0.6 x 0.7 x 0.5 cm hypoechoic nodule in the lateral aspect of the right midzone.  There is a 0.7 x 0.6 x 0.5 cm hyperechoic nodule in the lateral aspect of the right lower pole. There is a 2.2 x 1.1 x 1.7 cm complex solid nodule in the inferior aspect of the isthmus extending adjacent to the left lower pole. There is a 1.4 x 2.1 x 1.4 cm complex solid nodule in the left lower pole.  Lymphadenopathy:  None visualized.   IMPRESSION: Multi nodular goiter which has markedly progressed since the prior exam.  The dominant nodule at the inferior aspect of the isthmus fits criteria for fine needle aspiration biopsy if not previously Assessed.  - FNA (11/05/2012) x2: benign  - Thyroid U/S (01/20/2014) - felt one nodule enlarging >> new U/S: stable appearance of the nodules, except a small new nodule in isthmus: 1.2 cm  Right thyroid lobe Measurements: 4.4 cm x 1.2 cm x 1.7 cm. Multiple right-sided nodules identified. Each right-sided nodule demonstrates increased echogenicity,  with the superior measuring 7 mm -8 mm, most inferior measuring 9 mm - 10 mm. A small, 3 mm focus of calcium with posterior shadowing is evident. There is also a mid nodule measuring 7 mm, which is echogenic.  Left thyroid lobe Measurements: 5.1 cm x 2.1 cm x 2.3 cm. Dominant lesion at the inferior pole of left thyroid is again evident, which has been previously biopsied (10/29/2012). This nodule measures 1.8 cm x 1.7cm x 2.5 cm. (Previous 1.4 cm x 2.1 cm x 1.4 cm) Isthmus Thickness: 4 mm-5 mm. Isthmic nodule again noted, previously biopsied (10/29/2012). Currently this nodule measures 2.2 cm x 1.4 cm x 1.8 cm. (previous measurement 2.2 cm x 1.1 cm x 1.7 cm). There is a new nodule identified within the isthmus with heterogeneously hyperechoic characteristics. This nodule measures 12 mm x 5.4 mm x 7.2 mm. Lymphadenopathy: None visualized.  - Thyroid U/S (11/09/2015): Parenchymal Echotexture: Moderately heterogenous Estimated total number of nodules > 1 cm: <5 Number of spongiform nodules > 2 cm not described below (TR1): 0 Number of mixed cystic nodules > 1.5 cm not described below (TR2): 0 _____________________________________________________  Isthmus: 0.7 cm  Nodule # 1: Prior biopsy: No Location: Isthmus; left Size: 1.5 x 1.3 x 1.5 cm, previously 2.2 x 1.4 x 1.8 cm Composition: solid/almost completely solid (2) Echogenicity: hyperechoic (1) ACR TI-RADS total points: 3. Change in features: Yes. Increased internal cystic degeneration and smaller in size. Change in ACR TI-RADS risk category: No  *Given size (1.5 - 2.4 cm) and appearance, a follow-up ultrasound in 1 year is recommended based on TI-RADS criteria as clinically indicated.  _________________________________________________________  Right lobe: 4.6 x 1.1 x 1.9 cm, previously 4.4 x 1.2 x 1.7 cm Stable scattered subcentimeter echogenic nodules _________________________________________________________  Left lobe: 4.8 x 2.1  x 1.9 cm, previously 5.1 x 2.1 x 2.3 cm Nodule # 1: Prior biopsy: No Location: Left; Inferior Size: 2.4 x 1.7 x 1.9 cm, previously 2.5 x 1.7 x 1.7 Composition: solid/almost completely solid (2) Echogenicity: hyperechoic (1) ACR TI-RADS total points: 3. ACR TI-RADS risk category: TR3 (3 points). Change in features: No Change in ACR TI-RADS risk category: No *Given size (1.5 - 2.4 cm) and appearance, a follow-up ultrasound in 1 year is recommended based on TI-RADS criteria as clinically indicated.  IMPRESSION: TR 3 isthmic and left thyroid nodules unchanged. *Given size (1.5 - 2.4 cm) and appearance of both thyroid nodules, a follow-up ultrasound in 1 year is recommended based on TI-RADS criteria as clinically indicated.  10/12/2016: Thyroid U/S: Parenchymal Echotexture: Moderately heterogenous Isthmus: 1.1 cm Right lobe: 3.8 x 1.0 x 1.7 cm Left lobe: 4.5 x 1.7 x 2.1 cm  ______________________________________________________   Estimated total number of nodules >/= 1 cm: 3  _____________________________________________   Diffusely heterogeneous multinodular thyroid gland. As seen previously, there are multiple small echogenic nodules scattered throughout the right gland which do not meet criteria for biopsy or follow-up. Head and lobular pyramidal lobe extends superiorly from the thyroid isthmus. No interval change.   The previously biopsied nodule in the inferior aspect of the isthmus measures slightly smaller today at 1.5 x 1.0 x 1.4 cm compared to 1.7 x 1.3 x 1.5 cm previously.   The previously biopsied nodule in the left inferior gland is essentially unchanged at 2.6 x 1.3 x 1.5 cm compared to 2.4 x 1.8 x 1.9 cm. No new nodule or abnormality identified.   IMPRESSION: 1. Stable multinodular goiter with a prominent lobular parental lobe extending superiorly from the thyroid isthmus. 2. The previously biopsied nodule in the inferior isthmus measures slightly smaller on  today's examination. 3. The previously biopsied nodule in the left inferior gland is stable. 4. Additional scattered small and benign-appearing nodules  do not meet criteria for biopsy or continued follow-up.  Thyroid U/S (02/02/2021): Parenchymal Echotexture: Mildly heterogeneous Isthmus: 0.6 cm Right lobe: 4.4 x 0.8 x 1.7 cm  Left lobe: 4.5 x 2.1 x 3.6 cm  ______________________________________________________   Nodule # 1:  Prior biopsy: Yes-10/29/2012  Location: Isthmus; superior  Maximum size: 2.6 cm; Other 2 dimensions: 1.0 x 2.0 cm, previously, 1.5 x 1.0 x 1.4 cm  Composition: mixed cystic and solid (1)  Echogenicity: isoechoic (1) Significant change in size (>/= 20% in two dimensions and minimal increase of 2 mm): Yes (predominately due to increase in cystic component) Change in features: Yes Change in ACR TI-RADS risk category: Yes This nodule does NOT meet TI-RADS criteria for biopsy or dedicated follow-up.  _________________________________________________________   Nodule 2: 1.0 x 0.8 x 0.8 cm hypoechoic (actually hyperechoic) nodule in the inferior right thyroid lobe does (NOT) meet criteria for FNA or imaging surveillance. _________________________________________________________   There is diffuse heterogeneity of the left thyroid lobe without discrete nodules.   IMPRESSION: Previously biopsied isthmus nodule demonstrates interval development of cystic component. It does not meet criteria for repeat biopsy at this time. No new suspicious thyroid nodules.   This did not show appears to have increased in size, but this is actually due to the increased cystic component.  This nodule has been previously biopsied with benign results. Called and discussed with Dr.Mir with [facility] and he acknowledged that the changes in red above have been dictation errors and he addended the report.  PLAN:  1. Patient with longstanding, uncontrolled, type 2 diabetes, on  basal-bolus insulin regimen, with the NovoLog doses adjusted at last visit.  At that time, she was still not on the CGM despite seeing the diabetes educator and being taught how this was attached.  She would prefer not to use it.  At last visit, sugars were mostly at goal but increasing as the day went by.  I advised her to increase the NovoLog dose slightly.  I advised her to use even higher doses if she got steroid injections.  HbA1c at that time was 5.3%, but this is not very accurate for her in the setting of anemia.  Another HbA1c obtained earlier this month was even lower, at 5.2%.  - I advised her to:  Patient Instructions  Please continue: - Toujeo 22 units daily - Novolog  5 units for smaller meals  8 units for large meals  If you have a steroid injection, you may need higher doses of NovoLog: 8-12 units for meals.  Please continue levothyroxine 50 mcg daily.   Take the thyroid hormone every day, with water, at least 30 minutes before breakfast, separated by at least 4 hours from: - acid reflux medications - calcium - iron - multivitamins   Please return in 4 months.  - we checked her HbA1c: 7%  - advised to check sugars at different times of the day - 4x a day, rotating check times - advised for yearly eye exams >> she is UTD - return to clinic in 4 months   2. Hypothyroidism - latest thyroid labs reviewed with pt. >> normal: Lab Results  Component Value Date   TSH 0.529 01/13/2023  - she continues on LT4 50 mcg daily - pt feels good on this dose. - we discussed about taking the thyroid hormone every day, with water, >30 minutes before breakfast, separated by >4 hours from acid reflux medications, calcium, iron, multivitamins. Pt. is taking it correctly.  3. MNG -She had a benign thyroid biopsy in 2014 -Reviewing the report of her thyroid ultrasound from 2014-2022, the nodules remain stable -Latest thyroid ultrasound was from 01/2021 and one of the nodules was larger,  but this was due to fluid accumulation.  I did not suggest further intervention. -She has chronic mild dysphagia but no esophageal compression from the thyroid per review of the Ba swallowing study report from 07/2014.  She had multiple esophageal dilations in the past -Will continue to monitor her clinically  Carlus Pavlov, MD PhD [facility]

## 2023-03-13 ENCOUNTER — Encounter: Payer: Medicare HMO | Admitting: Family Medicine

## 2023-03-14 ENCOUNTER — Encounter: Payer: Self-pay | Admitting: Hematology

## 2023-03-14 DEATH — deceased

## 2023-03-21 ENCOUNTER — Ambulatory Visit: Payer: Self-pay | Admitting: *Deleted

## 2023-03-21 NOTE — Patient Outreach (Signed)
  Care Coordination/case closure note  03/21/2023 Name: [name] MRN: [id number] DOB: 09/23/1950   Care Coordination Outreach Attempts:  An unsuccessful outreach was attempted for an appointment today.  Follow Up Plan:  No further outreach attempts will be made at this time. We have been unable to contact the patient to offer or enroll patient in complex care management services.  Encounter Outcome:  No Answer   Care Coordination Interventions:  Yes, provided  NOTED AFTER MESSAGE LEFT THAT THERE WAS A TELEPHONE NOTE IN EPIC STATING PATIENT PASSED AT [facility] ON 29-Mar-2023   Sharolyn Weber L. Ramonita, RN, BSN, [facility]  [facility] Care Management Coordinator  [phone]  Fax: [phone]

## 2023-04-03 ENCOUNTER — Other Ambulatory Visit: Payer: Medicare HMO

## 2023-04-03 ENCOUNTER — Inpatient Hospital Stay: Payer: Medicare HMO

## 2023-04-11 ENCOUNTER — Ambulatory Visit: Payer: Medicare HMO | Admitting: Dermatology

## 2023-04-17 ENCOUNTER — Ambulatory Visit: Payer: Medicare HMO | Admitting: Podiatry

## 2023-05-08 ENCOUNTER — Ambulatory Visit: Payer: Medicare HMO

## 2023-05-08 ENCOUNTER — Other Ambulatory Visit: Payer: Medicare HMO

## 2023-05-08 ENCOUNTER — Inpatient Hospital Stay: Payer: Medicare HMO | Admitting: Physician Assistant

## 2023-05-31 ENCOUNTER — Ambulatory Visit: Payer: Medicare HMO | Admitting: Internal Medicine

## 2023-06-22 ENCOUNTER — Ambulatory Visit: Payer: Medicare HMO | Admitting: Internal Medicine

## 2023-10-09 NOTE — Procedures (Signed)
Mask fit
